# Patient Record
Sex: Female | Born: 1965
Health system: Southern US, Community
[De-identification: ages and names within clinical notes are randomized; demographics above are authoritative.]

## PROBLEM LIST (undated history)

## (undated) DIAGNOSIS — G4730 Sleep apnea, unspecified: Secondary | ICD-10-CM

## (undated) DIAGNOSIS — Z7289 Other problems related to lifestyle: Secondary | ICD-10-CM

## (undated) DIAGNOSIS — Z8719 Personal history of other diseases of the digestive system: Secondary | ICD-10-CM

## (undated) DIAGNOSIS — Z720 Tobacco use: Secondary | ICD-10-CM

## (undated) DIAGNOSIS — F410 Panic disorder [episodic paroxysmal anxiety] without agoraphobia: Secondary | ICD-10-CM

## (undated) DIAGNOSIS — I5042 Chronic combined systolic (congestive) and diastolic (congestive) heart failure: Secondary | ICD-10-CM

## (undated) DIAGNOSIS — F419 Anxiety disorder, unspecified: Secondary | ICD-10-CM

## (undated) DIAGNOSIS — I43 Cardiomyopathy in diseases classified elsewhere: Secondary | ICD-10-CM

## (undated) DIAGNOSIS — F1410 Cocaine abuse, uncomplicated: Secondary | ICD-10-CM

## (undated) DIAGNOSIS — E785 Hyperlipidemia, unspecified: Secondary | ICD-10-CM

## (undated) DIAGNOSIS — I639 Cerebral infarction, unspecified: Secondary | ICD-10-CM

## (undated) DIAGNOSIS — R32 Unspecified urinary incontinence: Secondary | ICD-10-CM

## (undated) DIAGNOSIS — Z91199 Patient's noncompliance with other medical treatment and regimen due to unspecified reason: Secondary | ICD-10-CM

## (undated) DIAGNOSIS — S8263XA Displaced fracture of lateral malleolus of unspecified fibula, initial encounter for closed fracture: Secondary | ICD-10-CM

## (undated) DIAGNOSIS — D649 Anemia, unspecified: Secondary | ICD-10-CM

## (undated) DIAGNOSIS — I480 Paroxysmal atrial fibrillation: Secondary | ICD-10-CM

## (undated) DIAGNOSIS — J449 Chronic obstructive pulmonary disease, unspecified: Secondary | ICD-10-CM

## (undated) DIAGNOSIS — I119 Hypertensive heart disease without heart failure: Secondary | ICD-10-CM

## (undated) DIAGNOSIS — N183 Chronic kidney disease, stage 3 unspecified: Secondary | ICD-10-CM

## (undated) DIAGNOSIS — Z9114 Patient's other noncompliance with medication regimen: Secondary | ICD-10-CM

## (undated) DIAGNOSIS — F1090 Alcohol use, unspecified, uncomplicated: Secondary | ICD-10-CM

## (undated) DIAGNOSIS — I4719 Other supraventricular tachycardia: Secondary | ICD-10-CM

## (undated) DIAGNOSIS — Z789 Other specified health status: Secondary | ICD-10-CM

## (undated) DIAGNOSIS — Z9119 Patient's noncompliance with other medical treatment and regimen: Secondary | ICD-10-CM

## (undated) DIAGNOSIS — K922 Gastrointestinal hemorrhage, unspecified: Secondary | ICD-10-CM

## (undated) DIAGNOSIS — Z8679 Personal history of other diseases of the circulatory system: Secondary | ICD-10-CM

## (undated) DIAGNOSIS — I10 Essential (primary) hypertension: Secondary | ICD-10-CM

## (undated) DIAGNOSIS — I70263 Atherosclerosis of native arteries of extremities with gangrene, bilateral legs: Secondary | ICD-10-CM

## (undated) DIAGNOSIS — I509 Heart failure, unspecified: Secondary | ICD-10-CM

## (undated) DIAGNOSIS — N6320 Unspecified lump in the left breast, unspecified quadrant: Secondary | ICD-10-CM

## (undated) DIAGNOSIS — E669 Obesity, unspecified: Secondary | ICD-10-CM

## (undated) DIAGNOSIS — E119 Type 2 diabetes mellitus without complications: Secondary | ICD-10-CM

## (undated) DIAGNOSIS — I471 Supraventricular tachycardia: Secondary | ICD-10-CM

## (undated) DIAGNOSIS — E11610 Type 2 diabetes mellitus with diabetic neuropathic arthropathy: Secondary | ICD-10-CM

## (undated) HISTORY — DX: Chronic combined systolic (congestive) and diastolic (congestive) heart failure: I50.42

## (undated) HISTORY — DX: Chronic kidney disease, stage 3 (moderate): N18.3

## (undated) HISTORY — DX: Alcohol use, unspecified, uncomplicated: F10.90

## (undated) HISTORY — DX: Morbid (severe) obesity due to excess calories: E66.01

## (undated) HISTORY — DX: Supraventricular tachycardia: I47.1

## (undated) HISTORY — DX: Cocaine abuse, uncomplicated: F14.10

## (undated) HISTORY — DX: Atherosclerosis of native arteries of extremities with gangrene, bilateral legs: I70.263

## (undated) HISTORY — DX: Chronic kidney disease, stage 3 unspecified: N18.30

## (undated) HISTORY — DX: Other supraventricular tachycardia: I47.19

## (undated) HISTORY — DX: Other specified health status: Z78.9

## (undated) HISTORY — DX: Paroxysmal atrial fibrillation: I48.0

## (undated) HISTORY — DX: Unspecified urinary incontinence: R32

## (undated) HISTORY — DX: Type 2 diabetes mellitus without complications: E11.9

## (undated) HISTORY — DX: Gastrointestinal hemorrhage, unspecified: K92.2

## (undated) HISTORY — DX: Hyperlipidemia, unspecified: E78.5

## (undated) HISTORY — PX: DILATION AND CURETTAGE OF UTERUS: SHX78

## (undated) HISTORY — DX: Obesity, unspecified: E66.9

## (undated) HISTORY — DX: Other problems related to lifestyle: Z72.89

## (undated) HISTORY — DX: Heart failure, unspecified: I50.9

## (undated) HISTORY — PX: BREAST BIOPSY: SHX20

## (undated) HISTORY — DX: Anemia, unspecified: D64.9

## (undated) SURGERY — Surgical Case
Anesthesia: *Unknown

---

## 2001-03-31 ENCOUNTER — Inpatient Hospital Stay (HOSPITAL_COMMUNITY): Admission: EM | Admit: 2001-03-31 | Discharge: 2001-04-04 | Payer: Self-pay | Admitting: Cardiology

## 2001-10-30 ENCOUNTER — Inpatient Hospital Stay (HOSPITAL_COMMUNITY): Admission: EM | Admit: 2001-10-30 | Discharge: 2001-11-02 | Payer: Self-pay | Admitting: Emergency Medicine

## 2001-10-30 ENCOUNTER — Encounter: Payer: Self-pay | Admitting: Emergency Medicine

## 2002-01-02 ENCOUNTER — Ambulatory Visit (HOSPITAL_COMMUNITY): Admission: RE | Admit: 2002-01-02 | Discharge: 2002-01-02 | Payer: Self-pay | Admitting: Cardiology

## 2004-01-07 ENCOUNTER — Emergency Department (HOSPITAL_COMMUNITY): Admission: EM | Admit: 2004-01-07 | Discharge: 2004-01-07 | Payer: Self-pay | Admitting: Emergency Medicine

## 2004-08-13 ENCOUNTER — Ambulatory Visit (HOSPITAL_COMMUNITY): Admission: RE | Admit: 2004-08-13 | Discharge: 2004-08-13 | Payer: Self-pay | Admitting: Family Medicine

## 2004-08-20 ENCOUNTER — Ambulatory Visit (HOSPITAL_COMMUNITY): Admission: RE | Admit: 2004-08-20 | Discharge: 2004-08-20 | Payer: Self-pay | Admitting: Family Medicine

## 2004-09-23 ENCOUNTER — Ambulatory Visit: Payer: Self-pay | Admitting: Family Medicine

## 2004-09-23 ENCOUNTER — Inpatient Hospital Stay (HOSPITAL_COMMUNITY): Admission: AD | Admit: 2004-09-23 | Discharge: 2004-09-24 | Payer: Self-pay | Admitting: Internal Medicine

## 2005-04-24 ENCOUNTER — Ambulatory Visit: Payer: Self-pay | Admitting: Family Medicine

## 2005-05-06 ENCOUNTER — Ambulatory Visit: Payer: Self-pay | Admitting: Family Medicine

## 2005-05-06 ENCOUNTER — Inpatient Hospital Stay (HOSPITAL_COMMUNITY): Admission: EM | Admit: 2005-05-06 | Discharge: 2005-05-11 | Payer: Self-pay | Admitting: Emergency Medicine

## 2005-05-18 ENCOUNTER — Ambulatory Visit: Payer: Self-pay | Admitting: Family Medicine

## 2005-05-18 ENCOUNTER — Inpatient Hospital Stay (HOSPITAL_COMMUNITY): Admission: EM | Admit: 2005-05-18 | Discharge: 2005-05-22 | Payer: Self-pay | Admitting: Emergency Medicine

## 2005-07-30 IMAGING — CR DG CHEST 2V
2 series · 2 of 2 positions shown · non-contrast
Comparison: none

CLINICAL DATA: Cough.
 PA AND LATERAL CHEST 
 Comparison 10/30/01.  There is chronic cardiomegaly with tortuosity of the thoracic aorta.  There is some mild peribronchial thickening bilaterally which can be seen with bronchitis.  No acute infiltrates or effusions or pulmonary edema.
 IMPRESSION
 Cardiomegaly.  Mild bronchitic changes.

[view not recorded (1 of 2)]
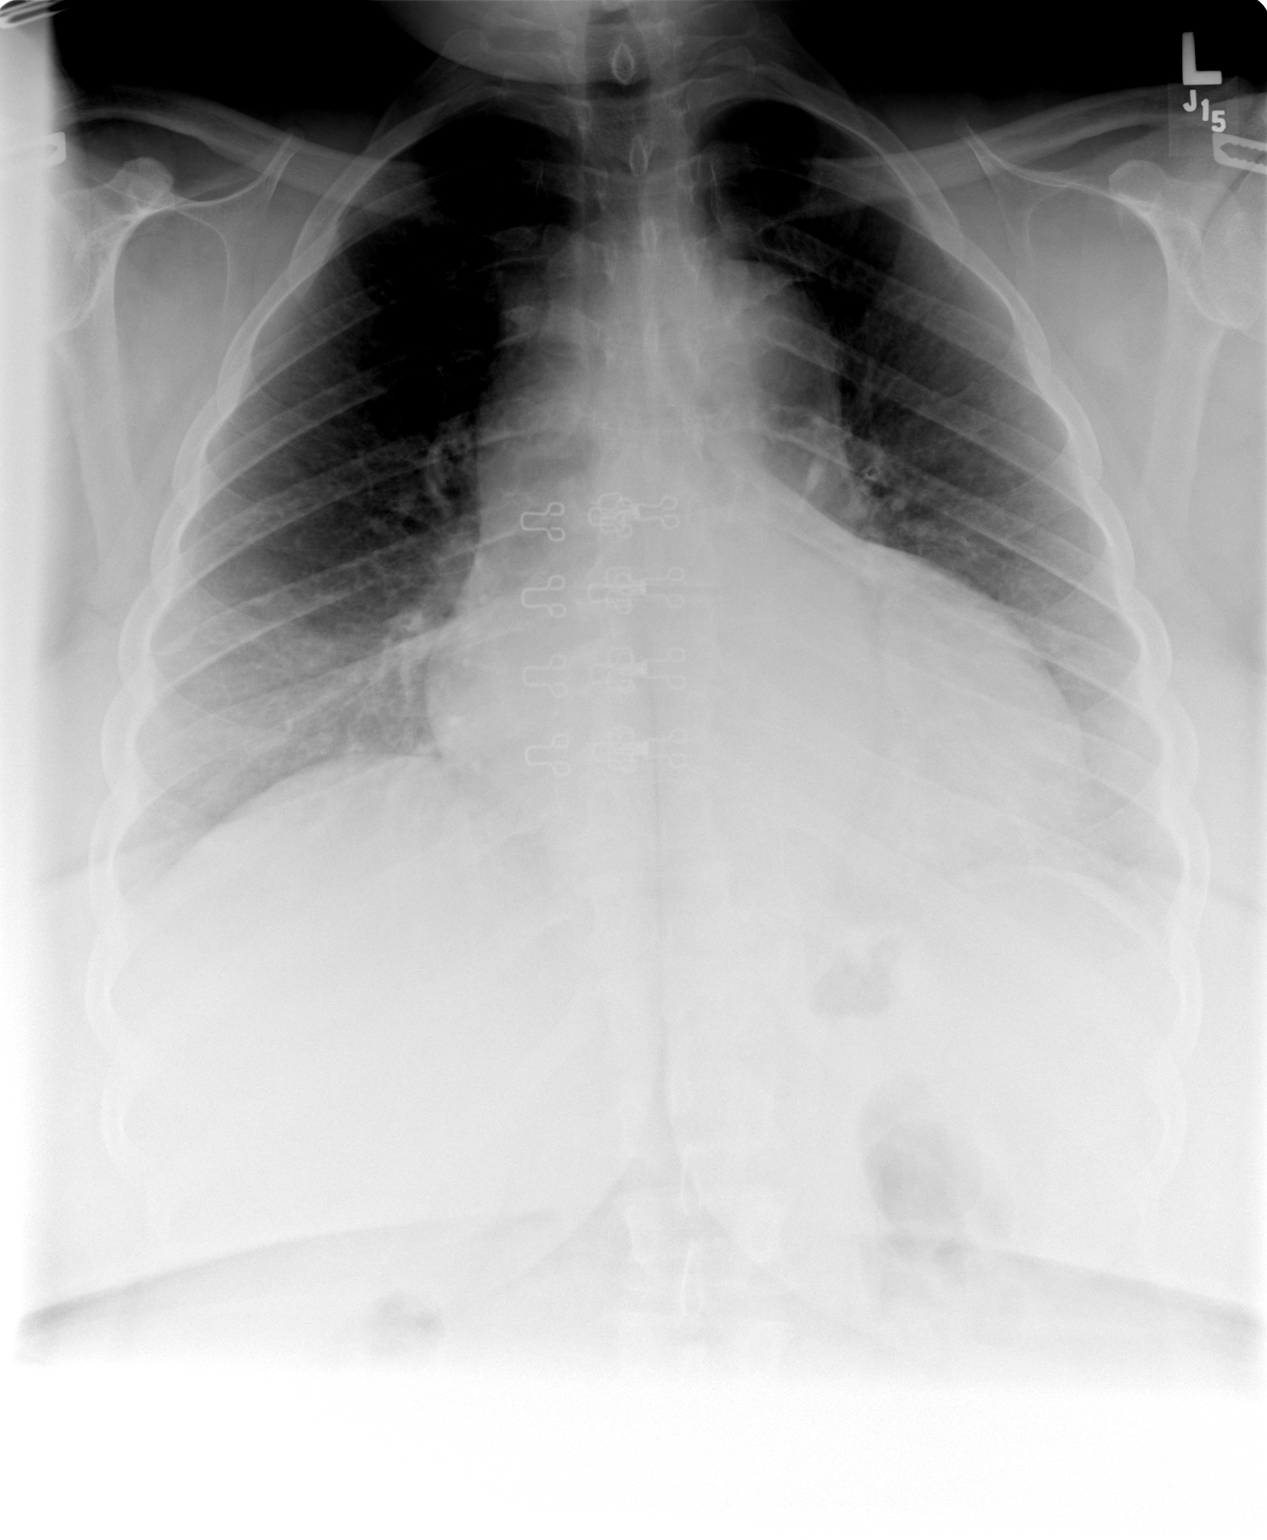

[view not recorded (2 of 2)]
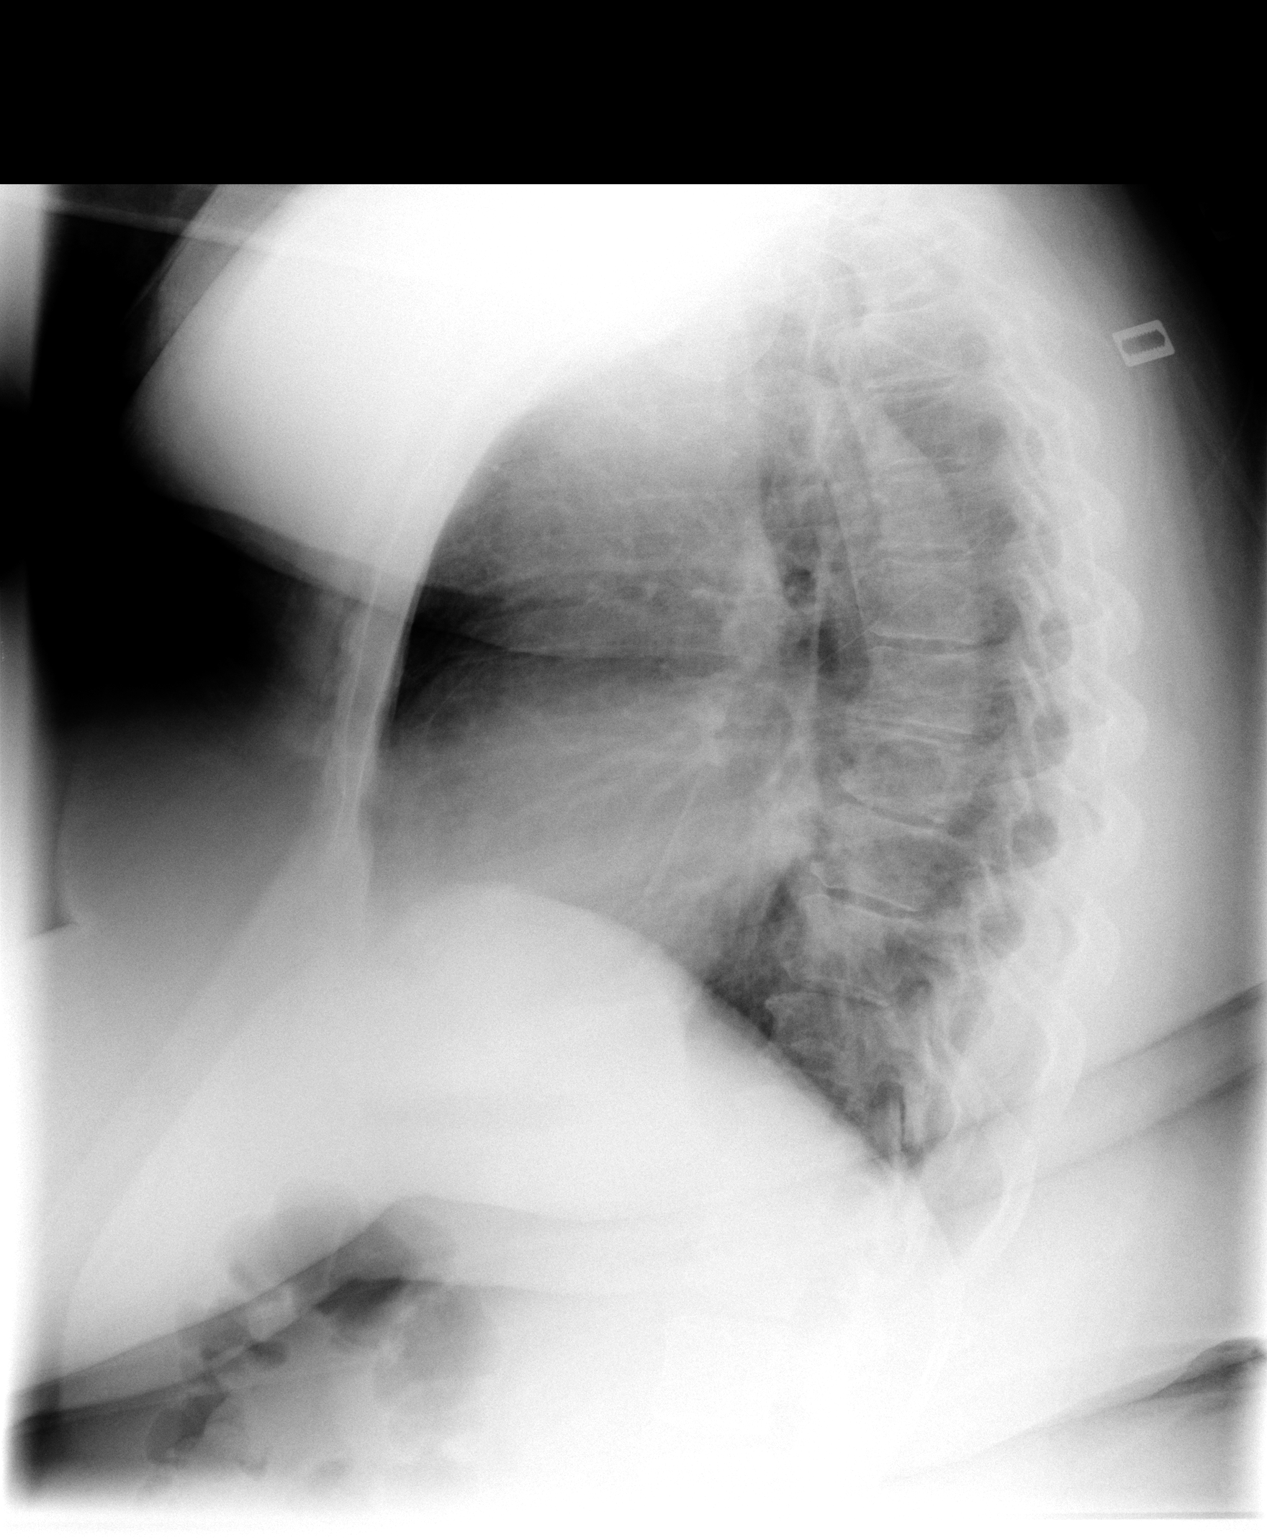

[2 of 2 positions shown; findings below may reference images not displayed]

## 2005-08-11 ENCOUNTER — Inpatient Hospital Stay (HOSPITAL_COMMUNITY): Admission: EM | Admit: 2005-08-11 | Discharge: 2005-08-14 | Payer: Self-pay | Admitting: Emergency Medicine

## 2005-08-11 ENCOUNTER — Ambulatory Visit: Payer: Self-pay | Admitting: *Deleted

## 2005-09-08 ENCOUNTER — Ambulatory Visit: Payer: Self-pay | Admitting: Family Medicine

## 2005-09-23 ENCOUNTER — Ambulatory Visit: Payer: Self-pay | Admitting: Family Medicine

## 2005-09-24 ENCOUNTER — Ambulatory Visit (HOSPITAL_COMMUNITY): Admission: RE | Admit: 2005-09-24 | Discharge: 2005-09-24 | Payer: Self-pay | Admitting: Family Medicine

## 2005-10-14 ENCOUNTER — Ambulatory Visit: Payer: Self-pay | Admitting: Family Medicine

## 2005-10-28 ENCOUNTER — Ambulatory Visit: Payer: Self-pay | Admitting: Family Medicine

## 2006-01-14 ENCOUNTER — Ambulatory Visit: Payer: Self-pay | Admitting: Family Medicine

## 2006-02-08 ENCOUNTER — Ambulatory Visit: Payer: Self-pay | Admitting: Family Medicine

## 2006-02-11 ENCOUNTER — Ambulatory Visit: Payer: Self-pay | Admitting: Family Medicine

## 2006-02-22 ENCOUNTER — Ambulatory Visit: Payer: Self-pay | Admitting: Family Medicine

## 2006-03-06 IMAGING — CR DG CHEST 2V
2 series · 2 of 2 positions shown · non-contrast
Comparison: 01/07/04.

CLINICAL DATA: Bronchitis and hypertension.  Smoker. 
 PA AND LATERAL CHEST

[view not recorded (1 of 2)]
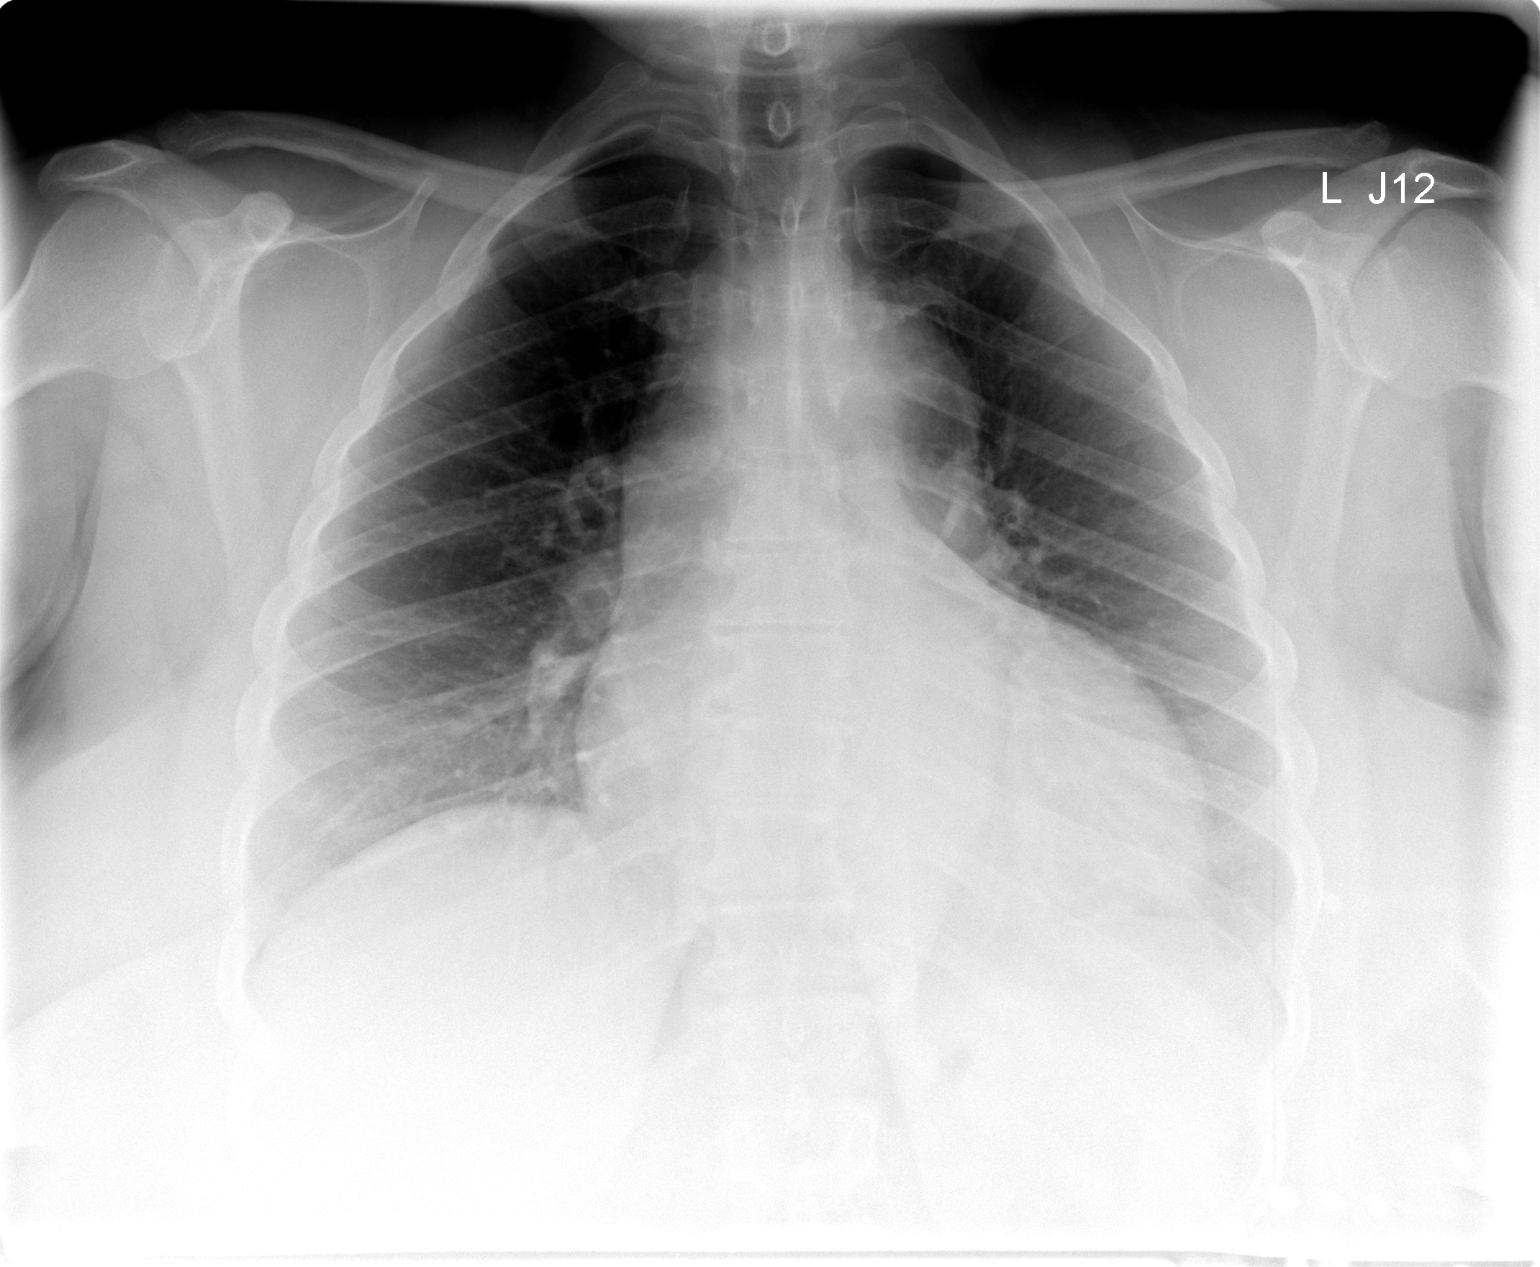

[view not recorded (2 of 2)]
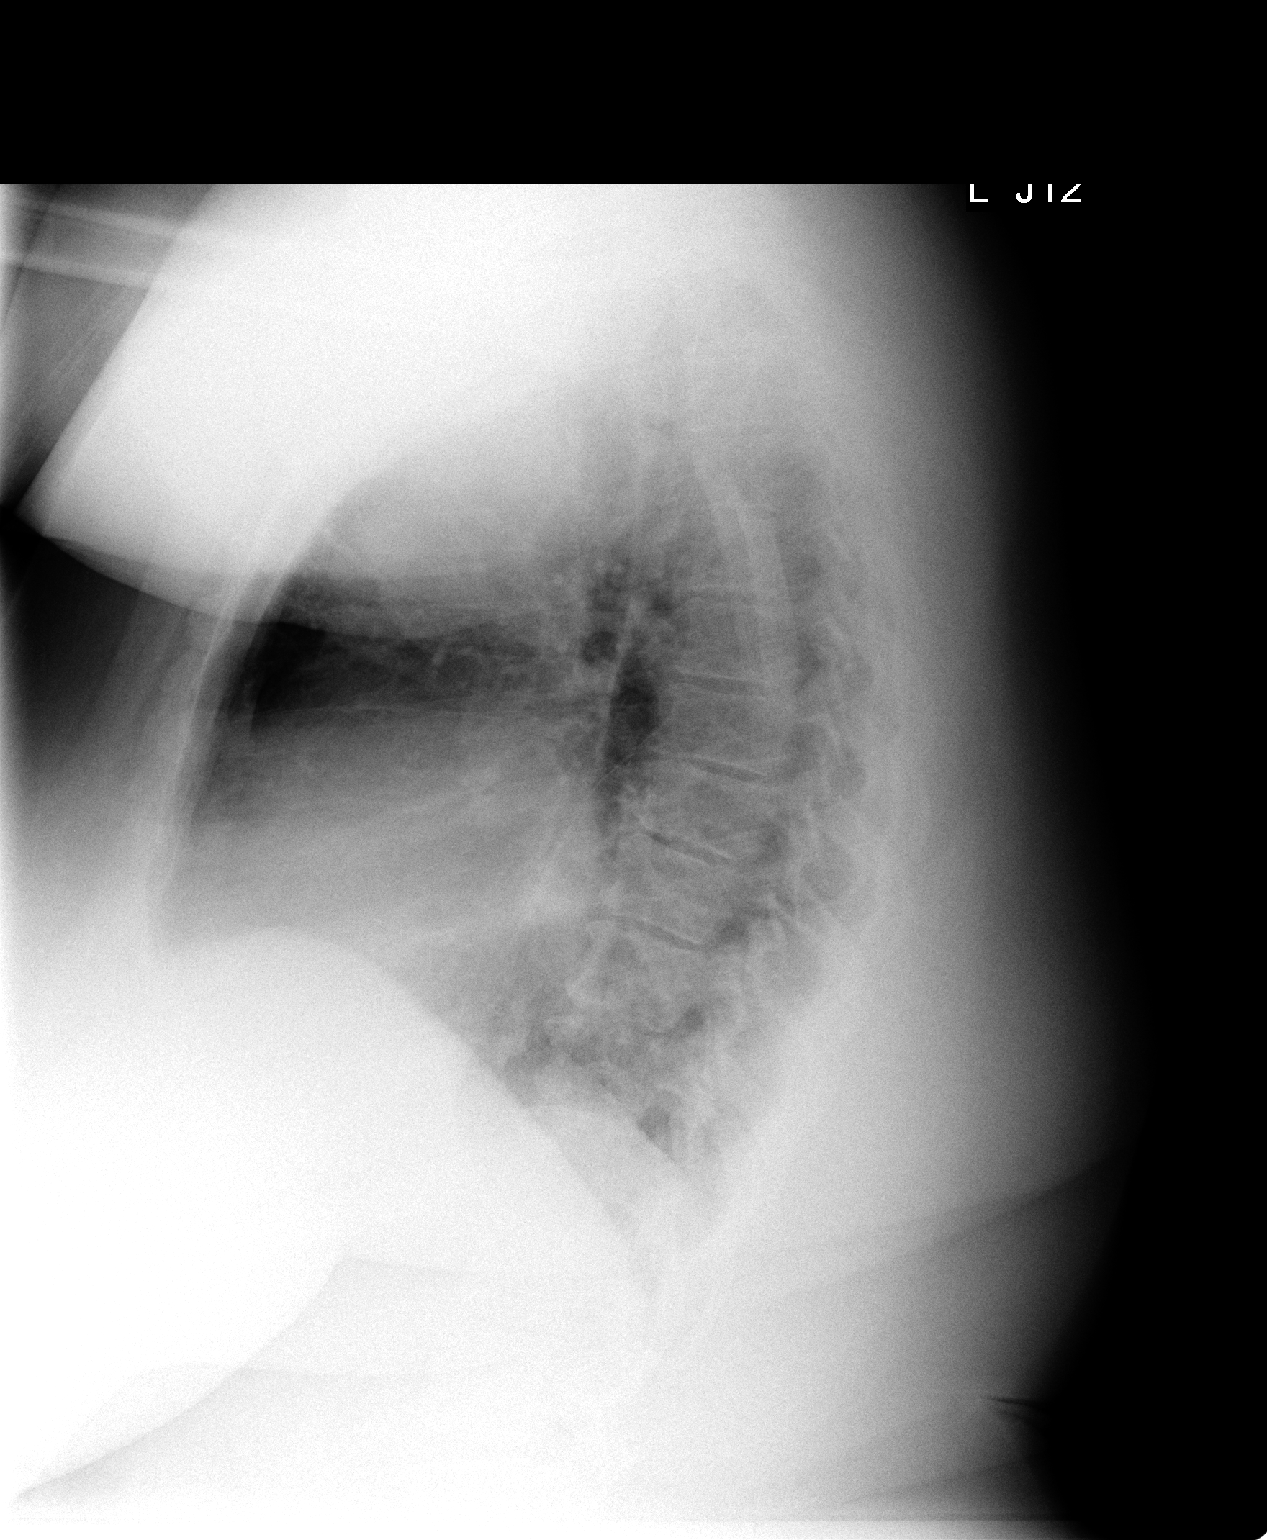

[2 of 2 positions shown; findings below may reference images not displayed]

There is stable cardiomegaly and aortic tortuosity.  Overall pulmonary aeration has improved with less peribronchial thickening.  There is no confluent airspace opacity or pleural effusion.  Osseous structures appear stable with scattered thoracic spine osteophytes.  
 IMPRESSION
 Stable cardiomegaly and aortic tortuosity.  Improved chronic peribronchial thickening.

## 2006-04-16 IMAGING — CR DG CHEST 2V
2 series · 2 of 2 positions shown · non-contrast
Comparison: 2 view chest x-ray 08/13/2004.

CLINICAL DATA: Uncontrolled hypertension.

CHEST - 2 VIEW  09/23/2004:

[view not recorded (1 of 2)]
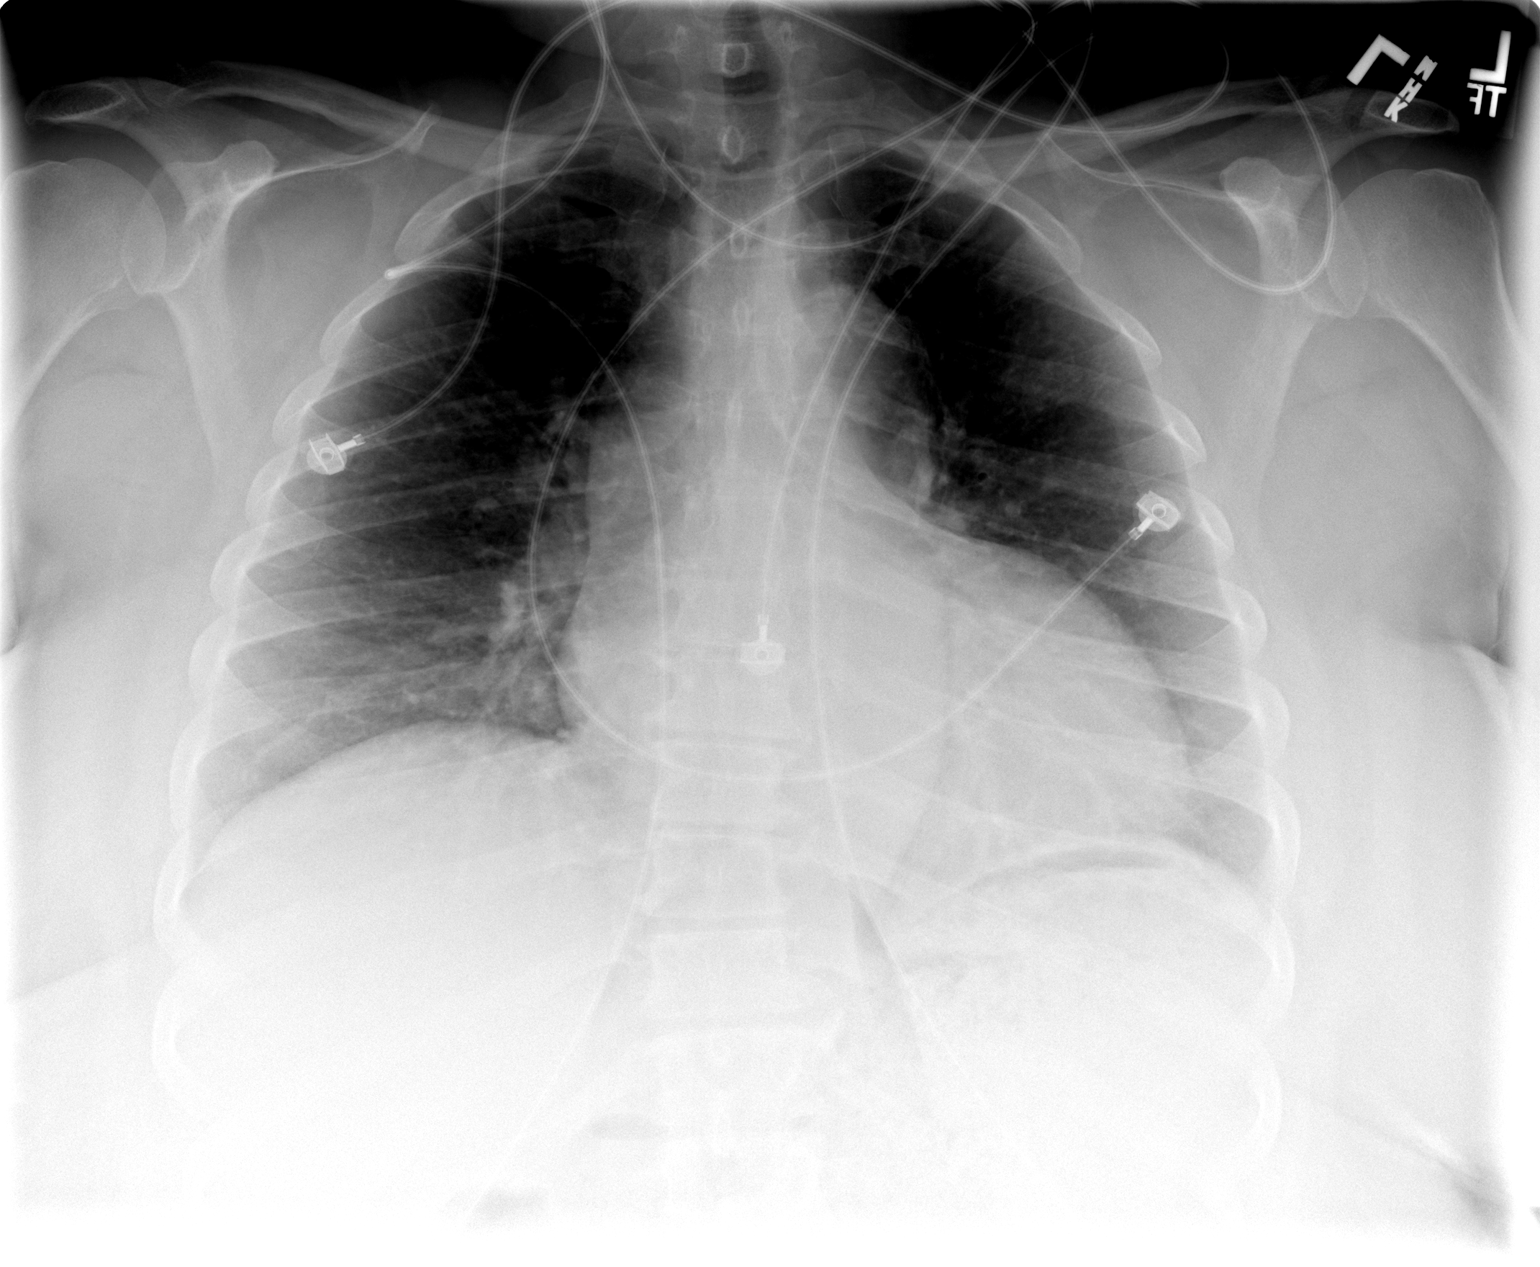

[view not recorded (2 of 2)]
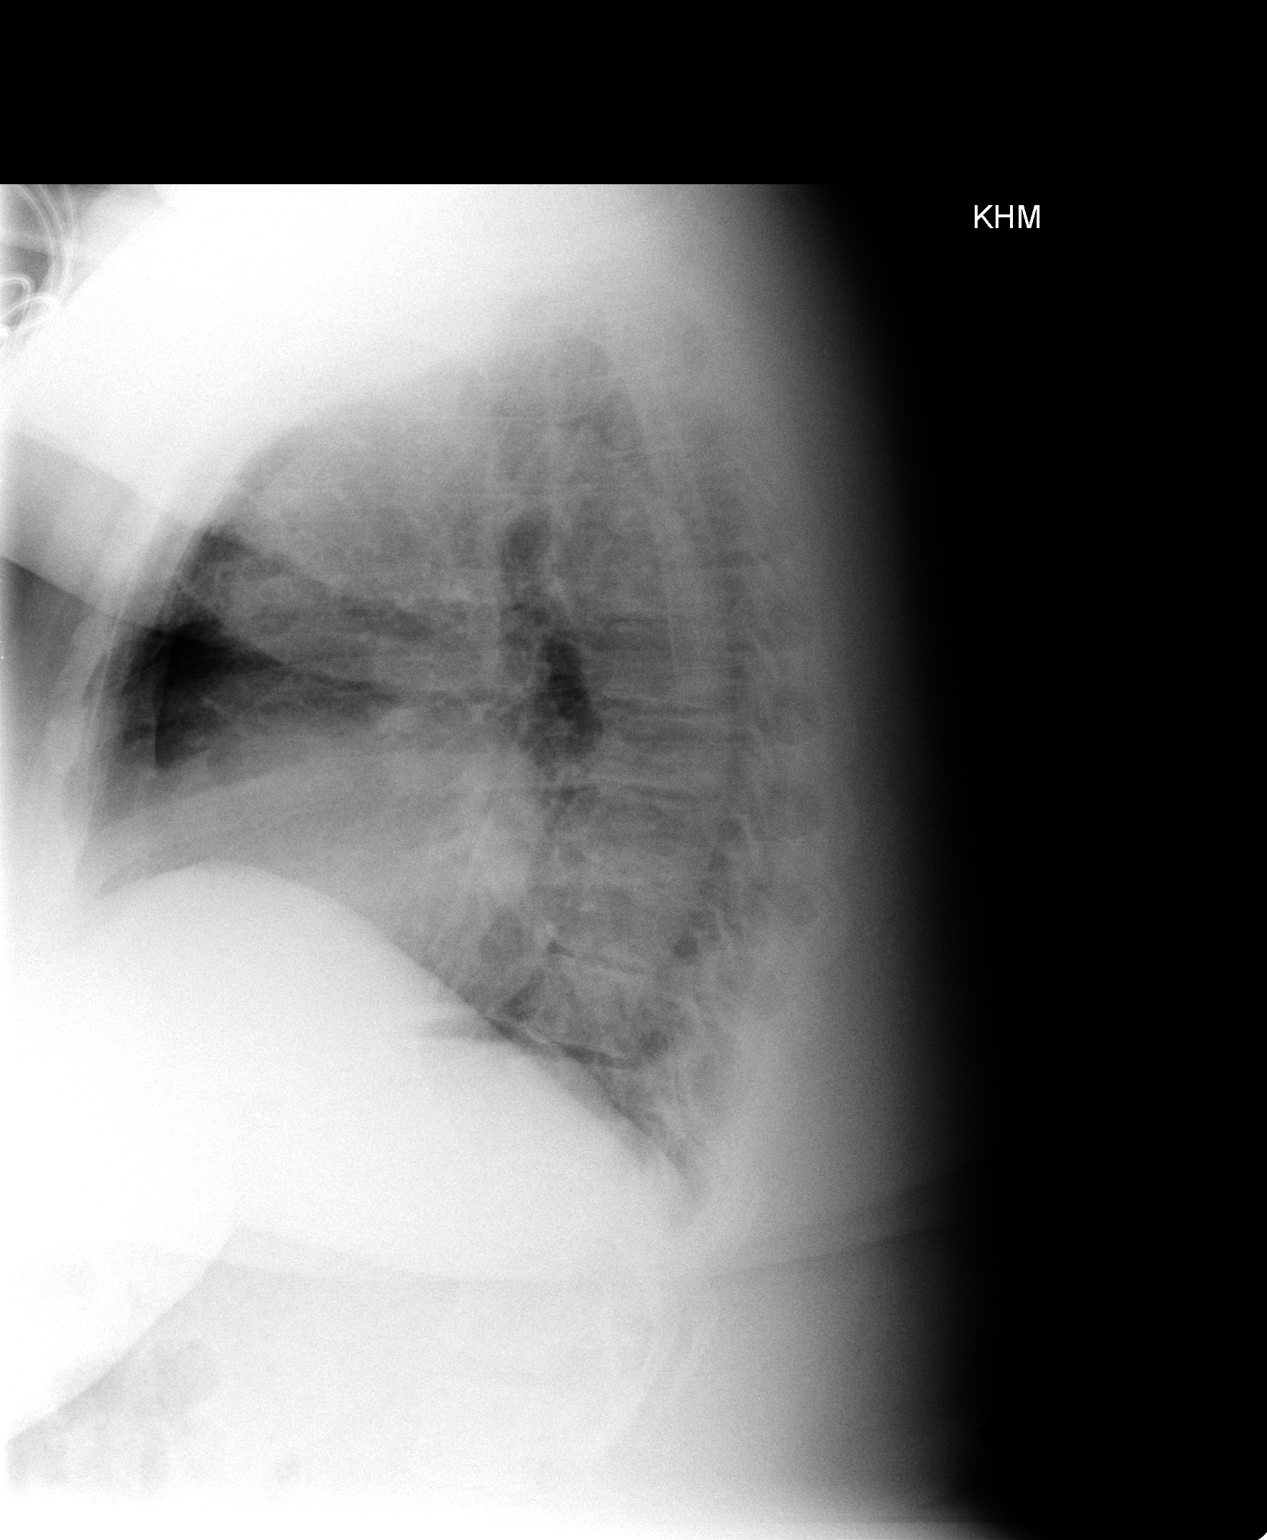

[2 of 2 positions shown; findings below may reference images not displayed]

FINDINGS: The heart is enlarged though stable. Thoracic aorta is tortuous
though unchanged. The hilar and mediastinal contours are otherwise unremarkable.
The lungs appear clear. Mild elevation of the right hemidiaphragm is again
noted. Degenerative changes are present throughout the thoracic spine.
IMPRESSION: 1. Stable cardiomegaly. Tortuous thoracic aorta consistent with hypertension,
unchanged.

2. No acute cardiopulmonary disease.

## 2006-04-17 IMAGING — MR MR HEAD W/O CM
6 of 9 series · 24 of 48 positions shown · non-contrast
Comparison: none

HISTORY: Hypertension question stroke

[Series 3: DWI · axial · 5.0mm · 0.76mm/px · z∈[-37,+95]mm · 3 of 25 slices shown (1 of 2)]
[im 1/25]
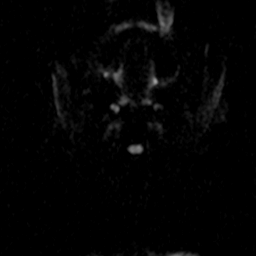
[im 13/25]
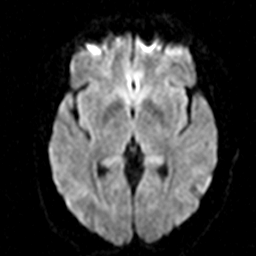
[im 25/25]
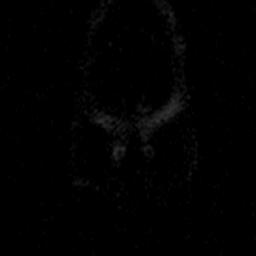

[Series 4: DWI · axial · 5.0mm · 0.86mm/px · z∈[-36,+96]mm · 3 of 25 slices shown (2 of 2)]
[im 1/25]
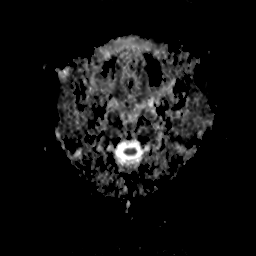
[im 13/25]
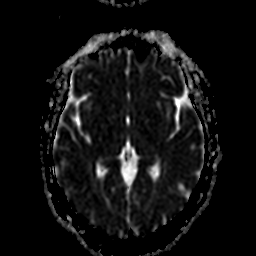
[im 25/25]
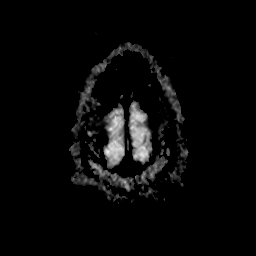

[Series 5: T2 · axial · 5.0mm · 0.57mm/px · z∈[-41,+96]mm · 4 of 24 slices shown]
[im 1/24]
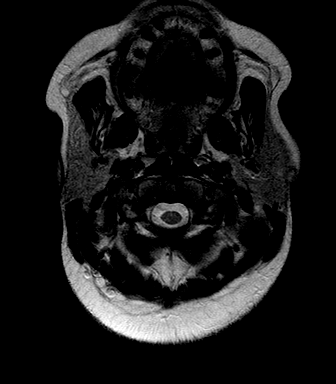
[im 8/24]
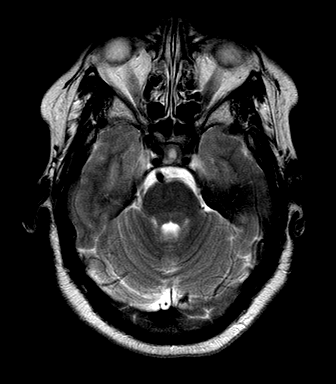
[im 16/24]
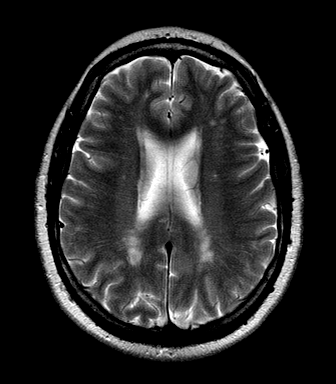
[im 24/24]
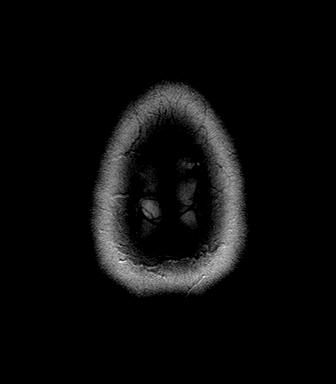

[Series 7: <mpr thick range> · axial · 1.6mm · 0.33mm/px · z∈[-45,+104]mm · 8 of 75 slices shown]
[im 1/75]
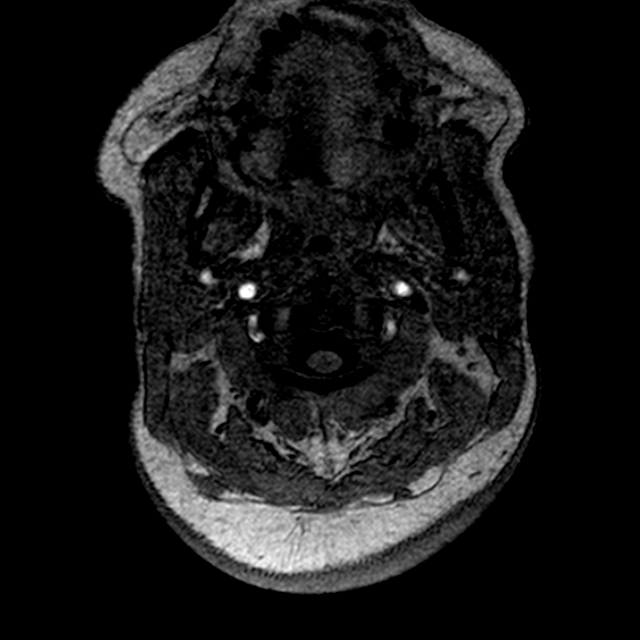
[im 15/75]
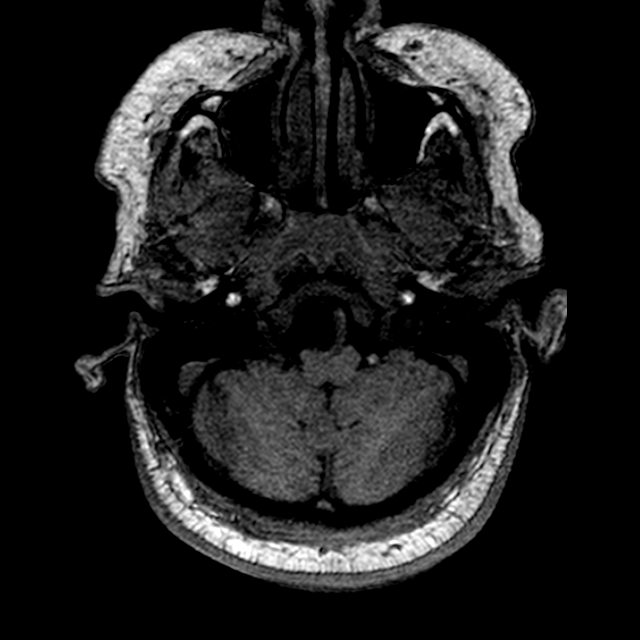
[im 23/75]
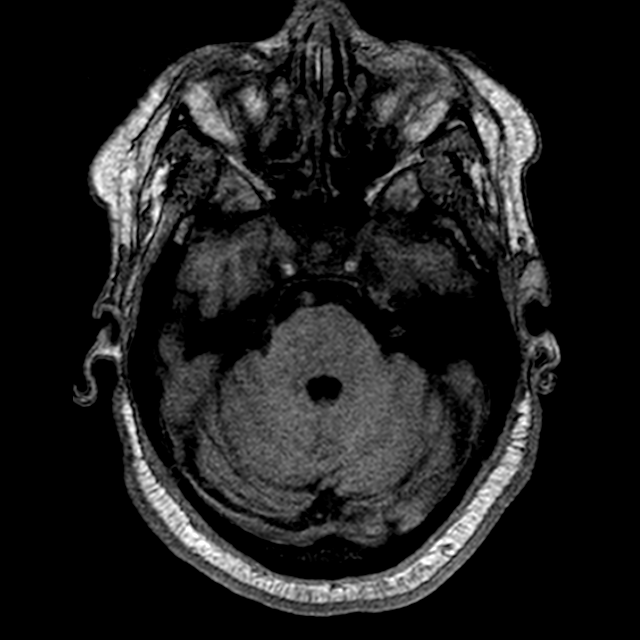
[im 30/75]
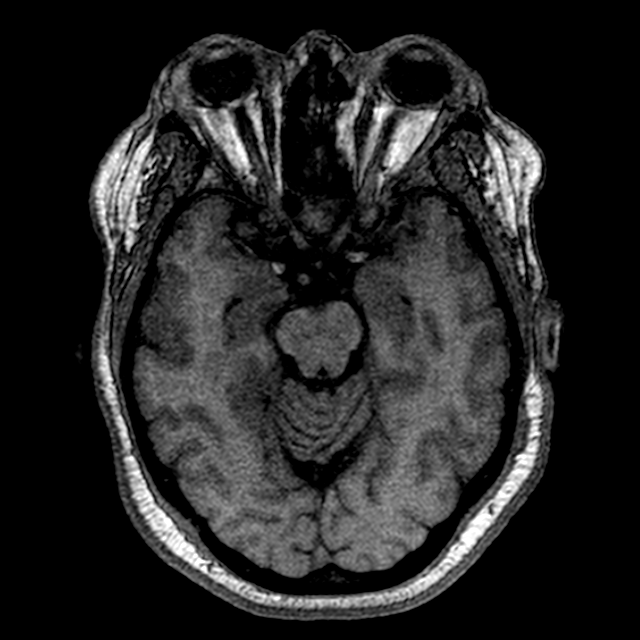
[im 45/75]
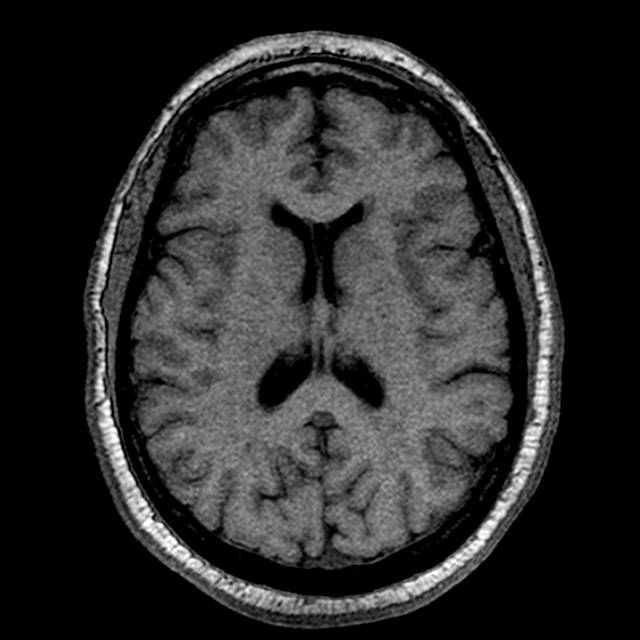
[im 52/75]
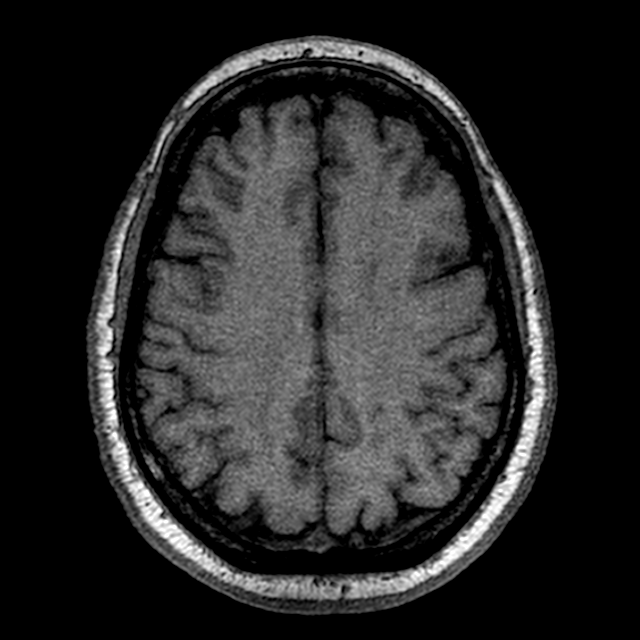
[im 60/75]
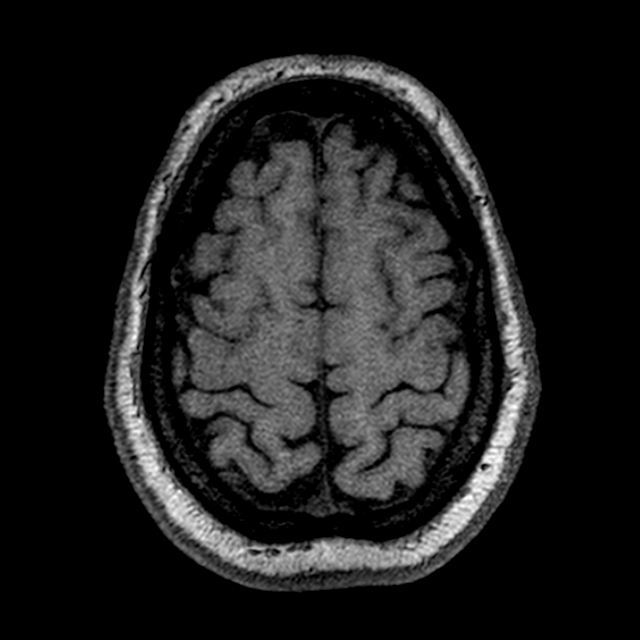
[im 75/75]
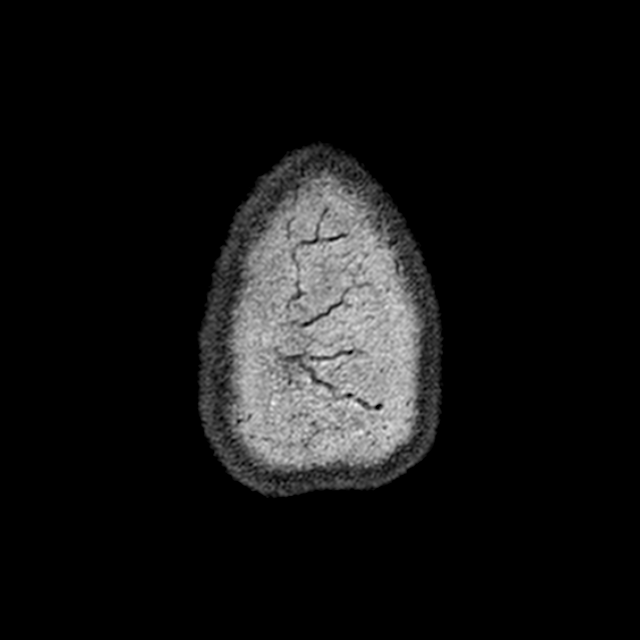

[Series 9: FLAIR · axial · 5.0mm · 0.56mm/px · z∈[-41,+96]mm · 4 of 24 slices shown]
[im 1/24]
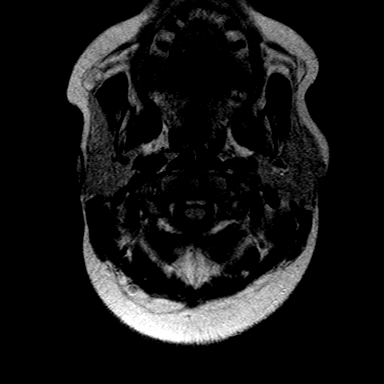
[im 8/24]
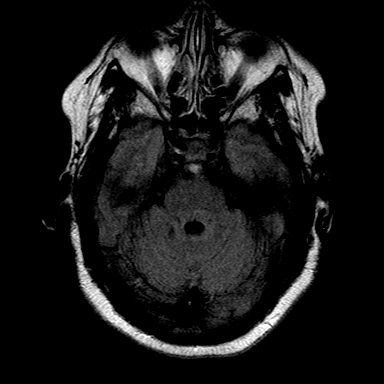
[im 16/24]
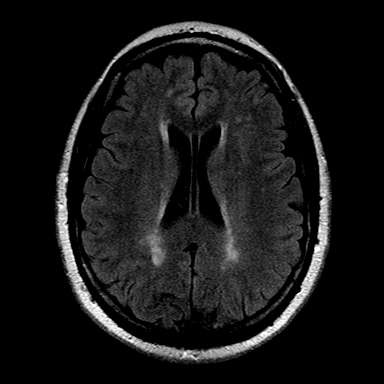
[im 24/24]
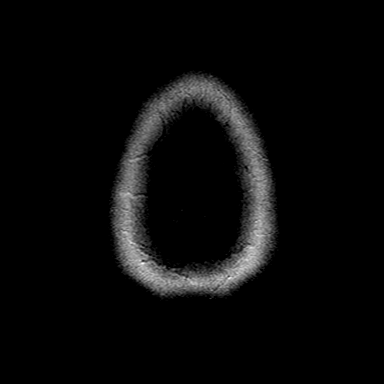

[Series 12: T1 · sagittal · 5.0mm · 0.66mm/px · 2 of 12 slices shown]
[im 1/12]
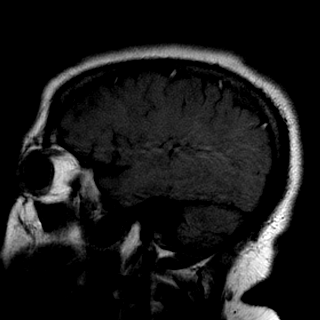
[im 12/12]
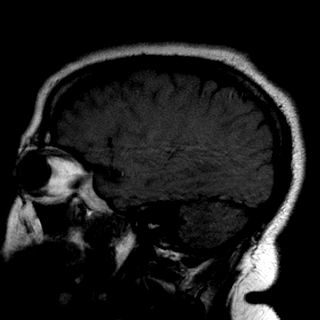

[24 of 48 positions shown; findings below may reference images not displayed]

MRI BRAIN WITHOUT CONTRAST:

Multiplanar noncontrast enlarging brain without priors for comparison.

Mild generalized atrophy.
Scattered foci of abnormal increased white matter signal intensity on FLAIR
sequence in both hemispheres question small vessel chronic ischemic changes,
demyelinating disorders multiple sclerosis, vasculitis, infectious inflammatory
processes, and in patient's with migraines.
No acute infarct by diffusion weighted images.
No mass,  hemorrhage, or old infarct.
No extra-axial fluid collections.
Flow-voids present in major intracranial vessels.
Dolichoectasia of the basilar artery seen with mild compression of the brainstem
at the pontomedullary junction.
Sella turcica, parasellar regions and basilar cisterns normal appearance.
Minimal mucosal thickening in right maxillary sinus.
IMPRESSION: Scattered foci of abnormal white matter signal in both hemispheres, differential
diagnosis above.
Mild dolichoectasia of the basilar artery with mild compression of the
brainstem.
No other intracranial abnormalities.

## 2006-04-19 ENCOUNTER — Ambulatory Visit: Payer: Self-pay | Admitting: Family Medicine

## 2006-05-18 ENCOUNTER — Ambulatory Visit: Payer: Self-pay | Admitting: Family Medicine

## 2006-07-14 ENCOUNTER — Ambulatory Visit: Payer: Self-pay | Admitting: Family Medicine

## 2006-07-28 ENCOUNTER — Ambulatory Visit: Payer: Self-pay | Admitting: Family Medicine

## 2006-08-03 ENCOUNTER — Ambulatory Visit: Payer: Self-pay | Admitting: Gastroenterology

## 2006-08-23 ENCOUNTER — Ambulatory Visit: Payer: Self-pay | Admitting: Gastroenterology

## 2006-08-23 ENCOUNTER — Ambulatory Visit (HOSPITAL_COMMUNITY): Admission: RE | Admit: 2006-08-23 | Discharge: 2006-08-23 | Payer: Self-pay | Admitting: Gastroenterology

## 2006-08-23 LAB — HM COLONOSCOPY: HM Colonoscopy: NORMAL

## 2006-09-27 ENCOUNTER — Ambulatory Visit (HOSPITAL_COMMUNITY): Admission: RE | Admit: 2006-09-27 | Discharge: 2006-09-27 | Payer: Self-pay | Admitting: Family Medicine

## 2006-10-13 ENCOUNTER — Ambulatory Visit: Payer: Self-pay | Admitting: Family Medicine

## 2006-11-17 ENCOUNTER — Encounter: Payer: Self-pay | Admitting: Family Medicine

## 2006-11-17 ENCOUNTER — Ambulatory Visit: Payer: Self-pay | Admitting: Family Medicine

## 2006-11-17 ENCOUNTER — Other Ambulatory Visit: Admission: RE | Admit: 2006-11-17 | Discharge: 2006-11-17 | Payer: Self-pay | Admitting: Family Medicine

## 2006-11-17 ENCOUNTER — Encounter (INDEPENDENT_AMBULATORY_CARE_PROVIDER_SITE_OTHER): Payer: Self-pay | Admitting: *Deleted

## 2006-11-17 LAB — CONVERTED CEMR LAB: Pap Smear: NORMAL

## 2006-11-18 ENCOUNTER — Encounter: Payer: Self-pay | Admitting: Family Medicine

## 2006-11-18 LAB — CONVERTED CEMR LAB
Candida species: NEGATIVE
Chlamydia, DNA Probe: NEGATIVE
GC Probe Amp, Genital: NEGATIVE
Gardnerella vaginalis: POSITIVE — AB
Trichomonal Vaginitis: POSITIVE — AB

## 2006-11-22 ENCOUNTER — Ambulatory Visit (HOSPITAL_COMMUNITY): Admission: RE | Admit: 2006-11-22 | Discharge: 2006-11-22 | Payer: Self-pay | Admitting: Family Medicine

## 2006-11-27 IMAGING — CR DG CHEST 1V PORT
1 series · 1 of 1 positions shown · non-contrast
Comparison: 09/23/04.

CLINICAL DATA: Hypertension. 
 PORTABLE CHEST ? 1 VIEW:

[view not recorded]
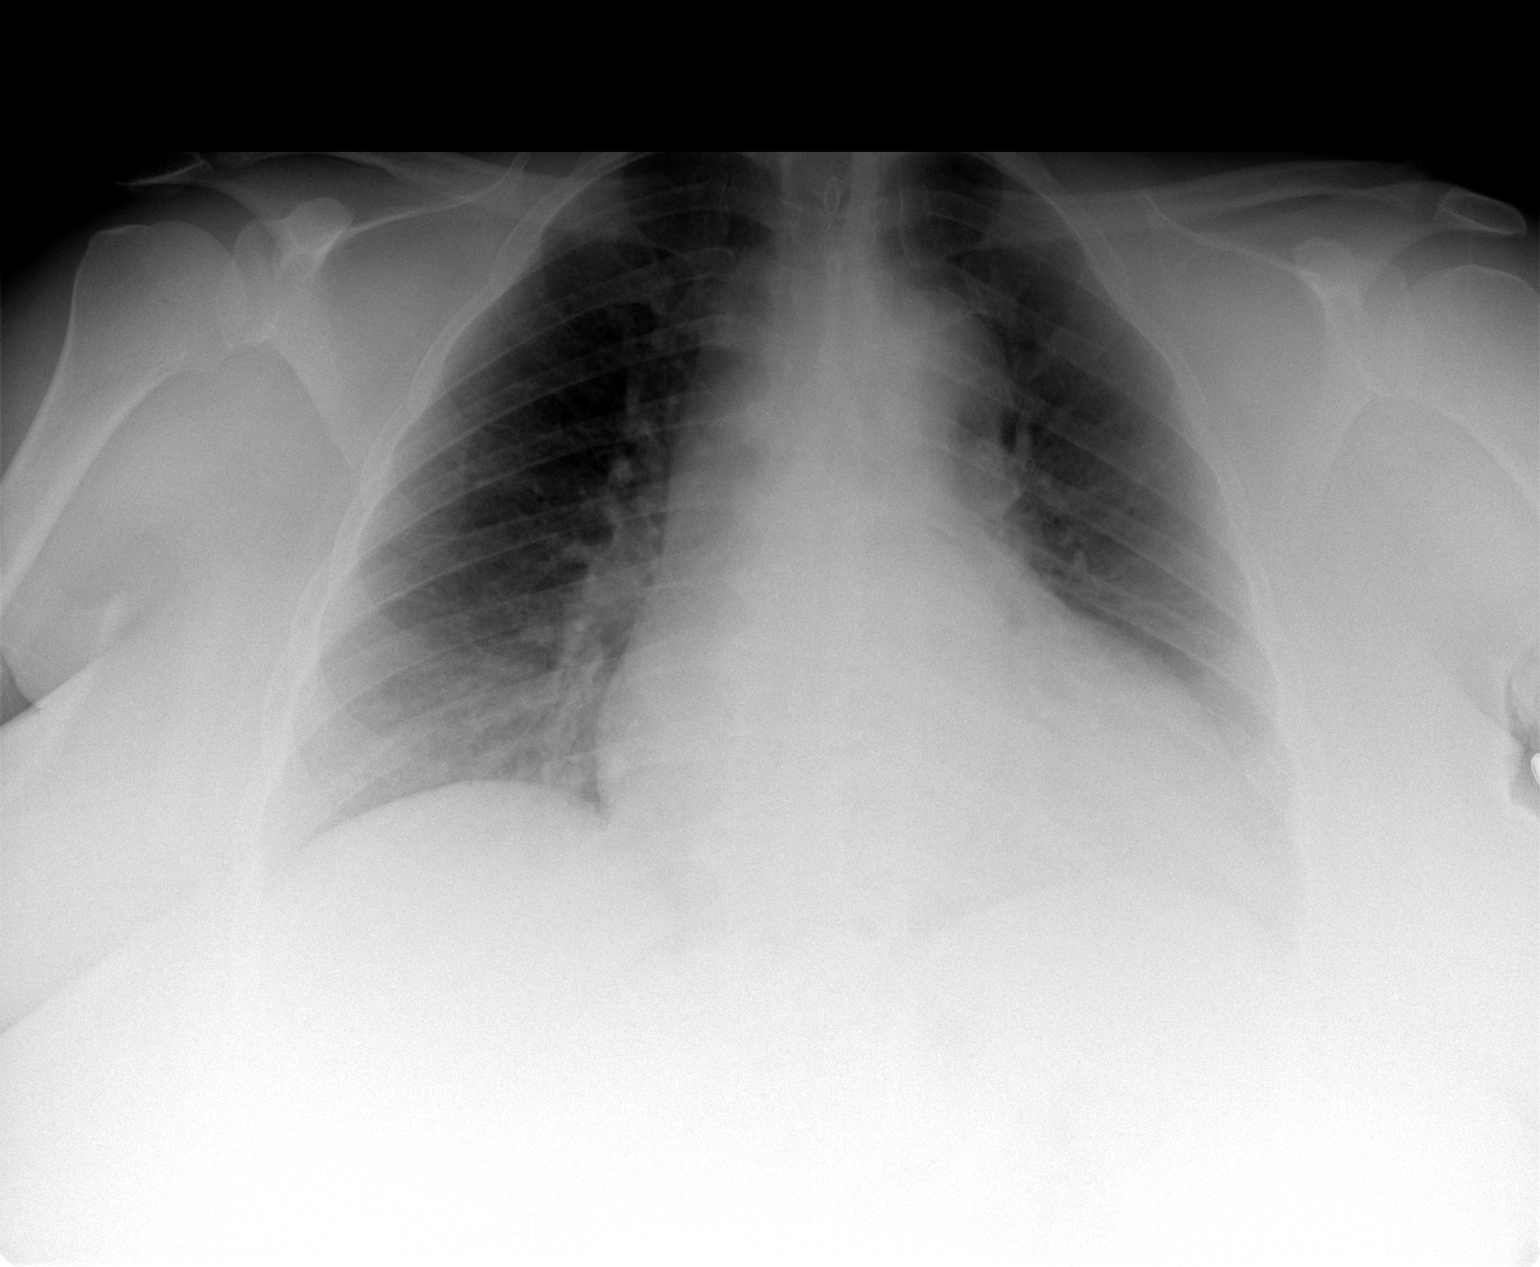

[1 of 1 positions shown; findings below may reference images not displayed]

Heart enlarged.  Aorta tortuous.  No frank congestive heart failure, active disease, or interval change.
IMPRESSION: Cardiomegaly ? no active disease.

## 2006-12-09 ENCOUNTER — Ambulatory Visit: Payer: Self-pay | Admitting: Orthopedic Surgery

## 2006-12-09 IMAGING — CR DG CHEST 1V PORT
1 series · 1 of 1 positions shown · non-contrast
Comparison: 05/06/05.

CLINICAL DATA: Hypertension.  
 PORTABLE CHEST - 1 VIEW - 05/18/05:

[view not recorded]
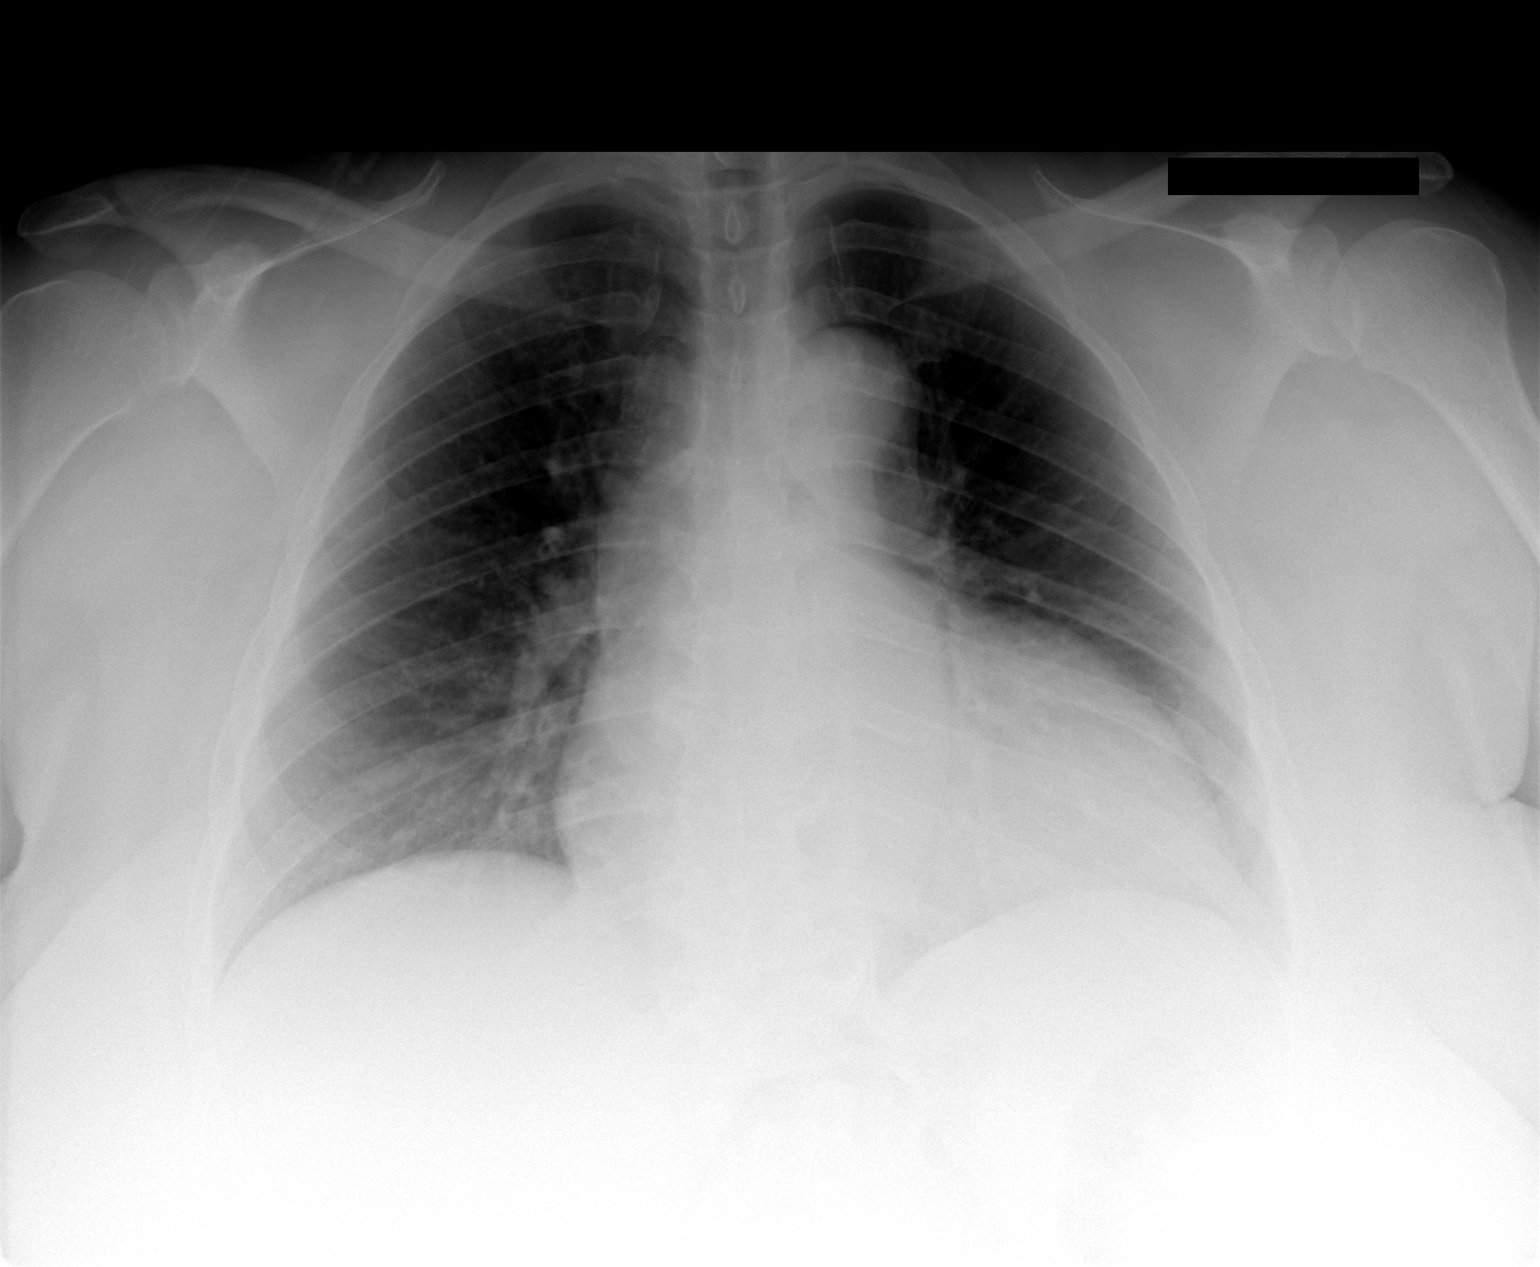

[1 of 1 positions shown; findings below may reference images not displayed]

Cardiomegaly is stable as well as tortuosity of the thoracic aorta.  Both lungs are clear.
IMPRESSION: Stable cardiomegaly.  No acute findings.

## 2006-12-12 IMAGING — MR MR MRA ABDOMEN W/ OR W/O CM
5 of 8 series · 20 of 48 positions shown · IV contrast (40ML GAD)
Comparison: none

CLINICAL DATA: Malignant renovascular hypertension

[Series 5: bSSFP · axial · 3.0mm · 0.70mm/px · z∈[+47,+224]mm · 4 of 60 slices shown (1 of 2)]
[im 1/60]
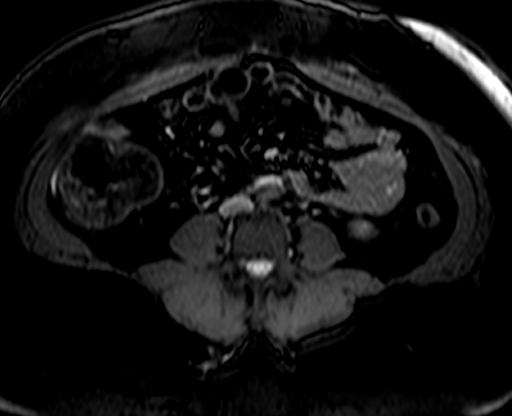
[im 20/60]
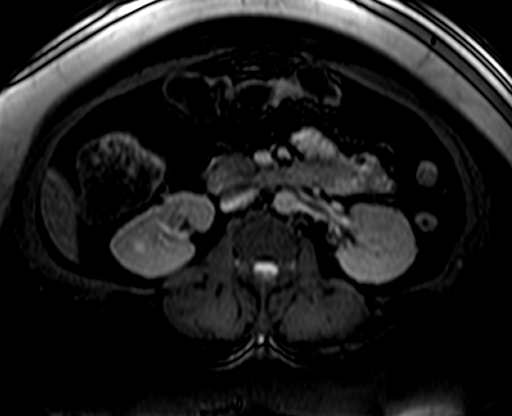
[im 40/60]
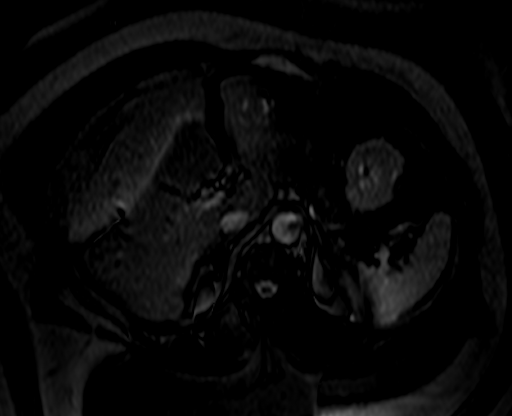
[im 60/60]
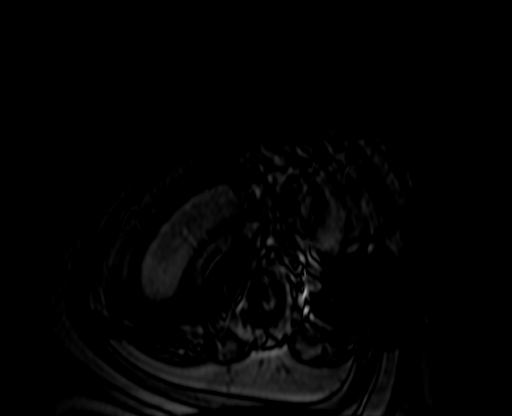

[Series 8: t2_haste_cor · coronal · 6.0mm · 0.90mm/px · 3 of 30 slices shown]
[im 1/30]
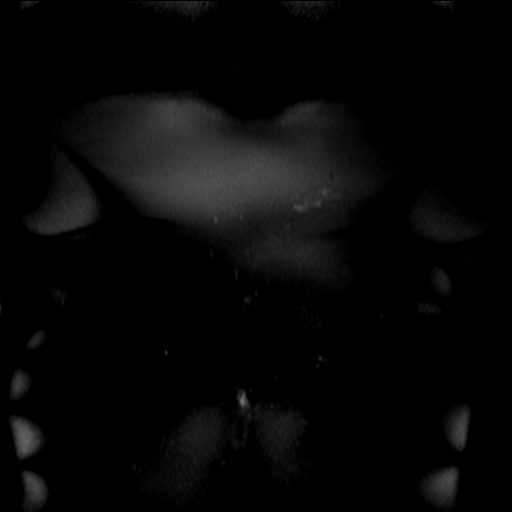
[im 15/30]
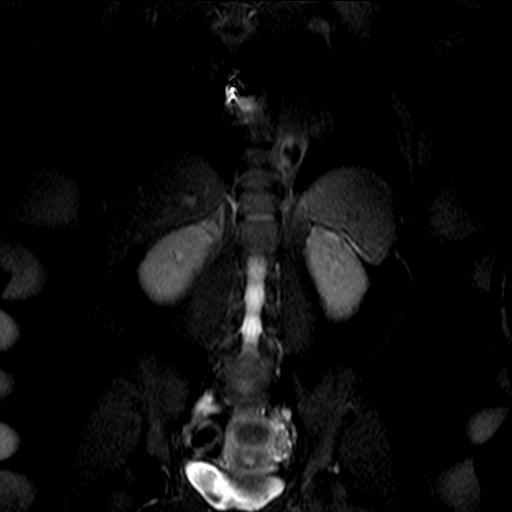
[im 30/30]
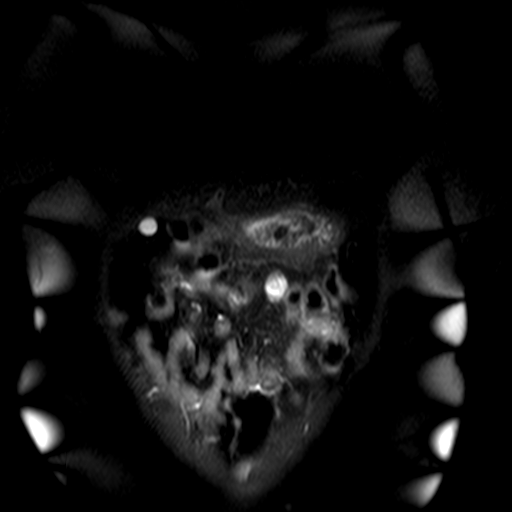

[Series 9: bSSFP · coronal · 3.0mm · 0.78mm/px · 4 of 45 slices shown (2 of 2)]
[im 1/45]
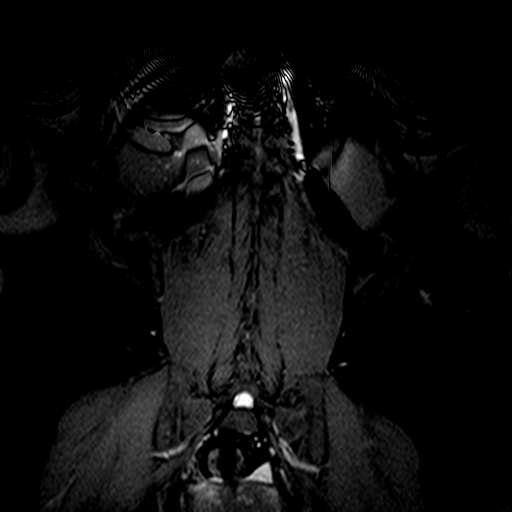
[im 15/45]
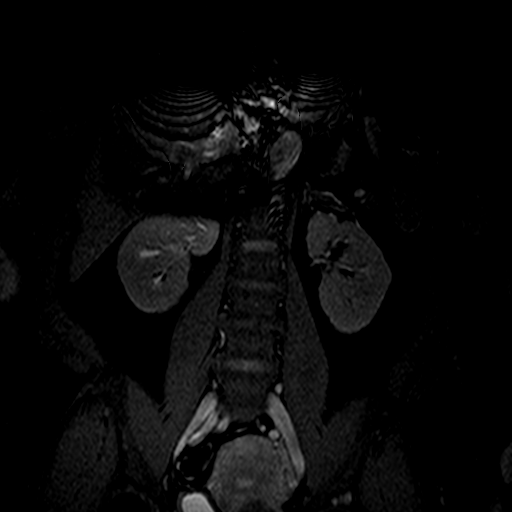
[im 30/45]
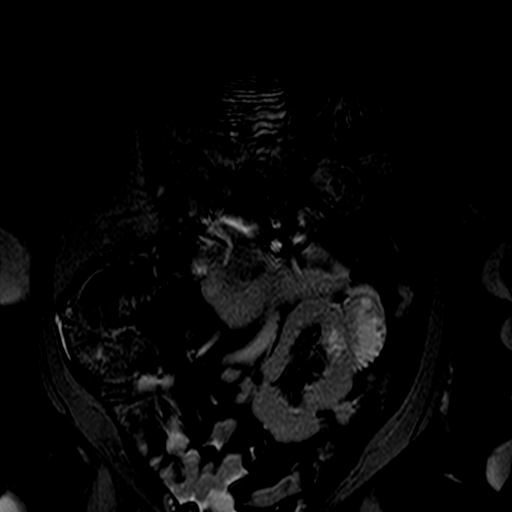
[im 45/45]
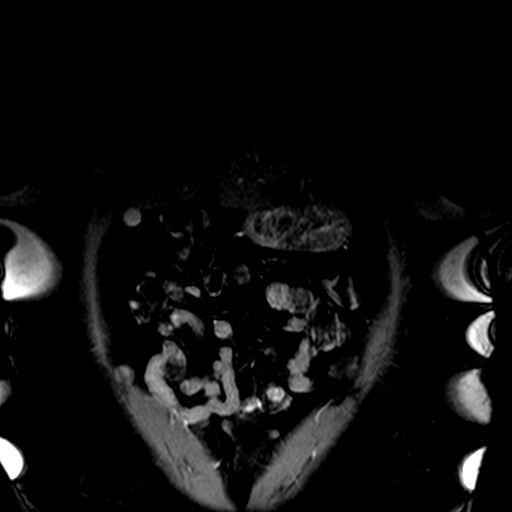

[Series 10: t1fs volume · axial · 2.0mm · 0.68mm/px · z∈[+34,+272]mm · 8 of 120 slices shown]
[im 1/120]
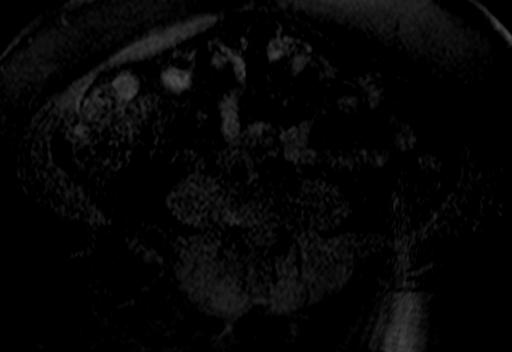
[im 14/120]
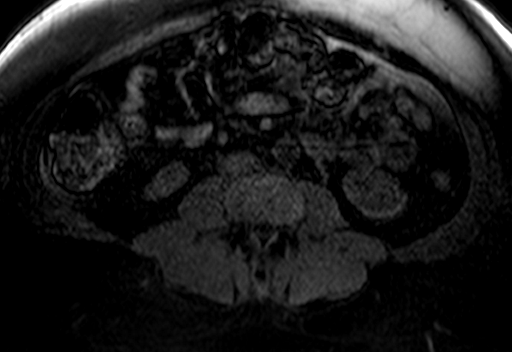
[im 40/120]
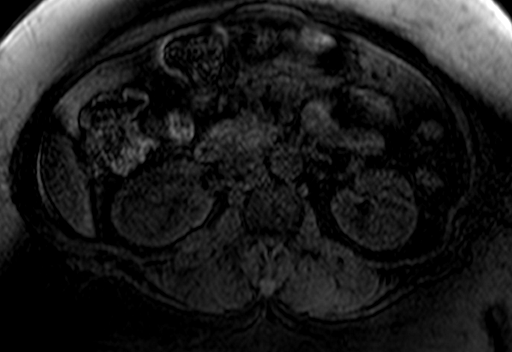
[im 53/120]
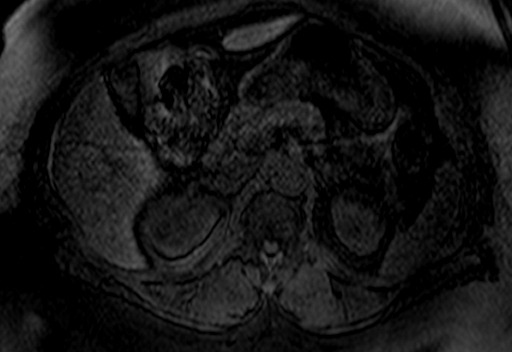
[im 67/120]
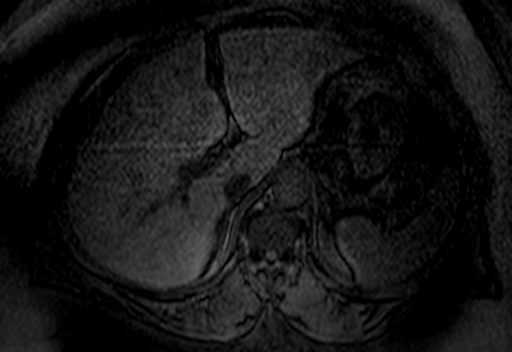
[im 80/120]
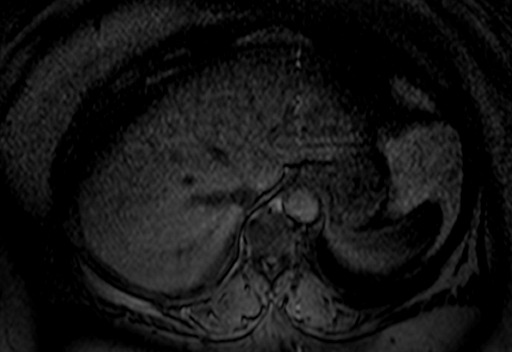
[im 106/120]
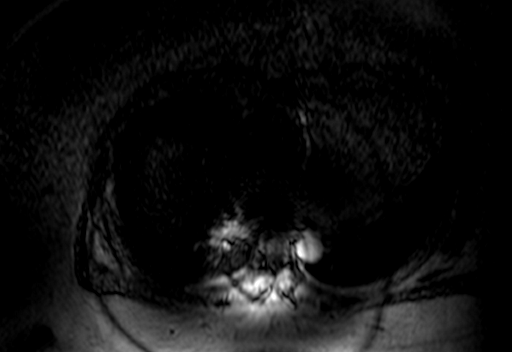
[im 120/120]
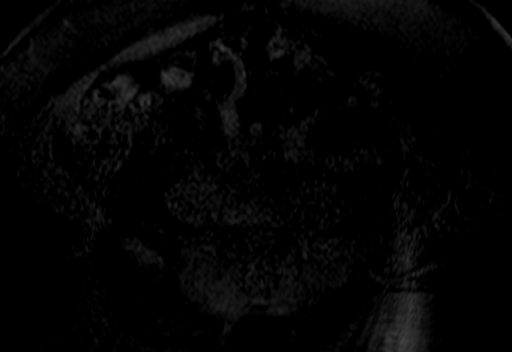

[Series 15: MRA post-contrast · coronal · 1.2mm · 0.45mm/px · 1 of 80 slices shown]
[im 1/80]
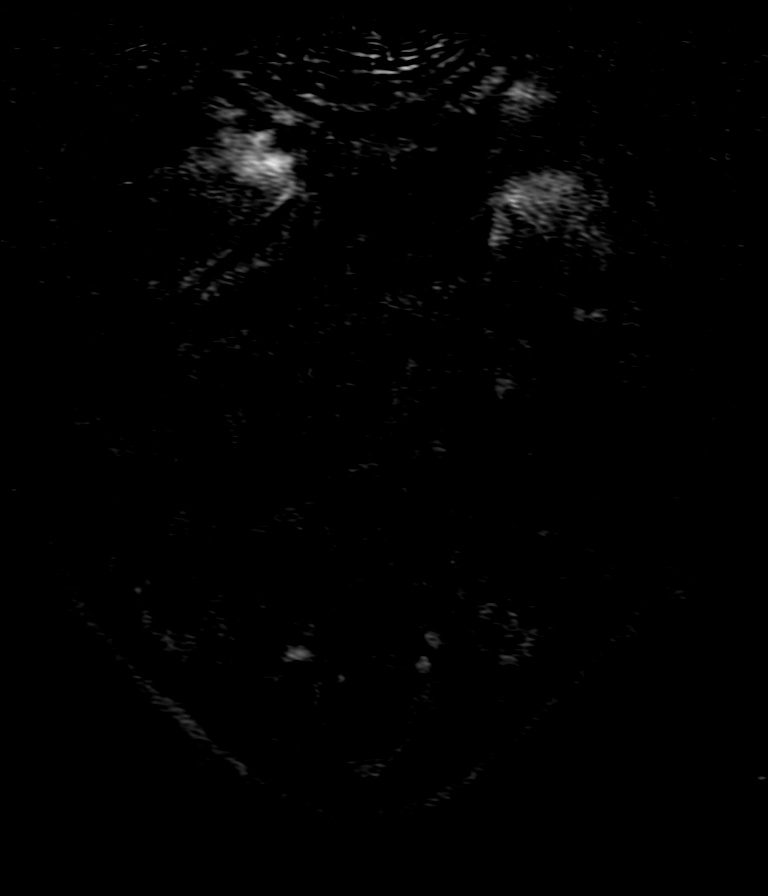

[20 of 48 positions shown; findings below may reference images not displayed]

MRA abdomen  with contrast:

Multiplanar multisequence MR of the abdomen  including 3-D dynamic MRA of the
abdominal aorta and its  branches using 40  mL Omniscan IV.  

Comparison 09/24/2004.  
The abdominal aorta is mildly tortuous without significant atheromatous
irregularity. Celiac axis and SMA widely patent proximally. Single left renal
artery, widely patent. Single right renal artery, widely patent. Inferior
mesenteric artery is patent proximally. Iliac arteries are tortuous without
significant stenosis. 
Kidneys normal in morphology without focal lesion or hydronephrosis. Nonspecific
14 mm high signal lesion in the caudate lobe of the liver, near the IVC,
incompletely characterized. This lesion is slight more prominent than on
previous exam.
IMPRESSION: 1. Negative for renal artery stenosis.
2. Nonspecific  liver lesion, without the typical imaging appearance of cyst,
and slightly increased in prominence since 09/24/2004. Consider dedicated  
liver CT or MR for further characterization.

## 2006-12-16 ENCOUNTER — Ambulatory Visit: Payer: Self-pay | Admitting: Family Medicine

## 2006-12-20 ENCOUNTER — Ambulatory Visit (HOSPITAL_COMMUNITY): Admission: RE | Admit: 2006-12-20 | Discharge: 2006-12-20 | Payer: Self-pay | Admitting: Family Medicine

## 2007-01-24 ENCOUNTER — Encounter: Payer: Self-pay | Admitting: Family Medicine

## 2007-01-24 LAB — CONVERTED CEMR LAB
ALT: 31 units/L (ref 0–35)
AST: 27 units/L (ref 0–37)
Albumin: 4.3 g/dL (ref 3.5–5.2)
Alkaline Phosphatase: 80 units/L (ref 39–117)
BUN: 19 mg/dL (ref 6–23)
Bilirubin, Direct: 0.1 mg/dL (ref 0.0–0.3)
CO2: 23 meq/L (ref 19–32)
Calcium: 9.6 mg/dL (ref 8.4–10.5)
Chloride: 109 meq/L (ref 96–112)
Cholesterol: 162 mg/dL (ref 0–200)
Creatinine, Ser: 1.23 mg/dL — ABNORMAL HIGH (ref 0.40–1.20)
Glucose, Bld: 70 mg/dL (ref 70–99)
HDL: 44 mg/dL (ref >39)
Hgb A1c MFr Bld: 6.2 % — ABNORMAL HIGH (ref 4.6–6.1)
Indirect Bilirubin: 0.3 mg/dL (ref 0.0–0.9)
LDL Cholesterol: 94 mg/dL (ref 0–99)
Potassium: 4.8 meq/L (ref 3.5–5.3)
Sodium: 143 meq/L (ref 135–145)
Total Bilirubin: 0.4 mg/dL (ref 0.3–1.2)
Total CHOL/HDL Ratio: 3.7
Total Protein: 7.2 g/dL (ref 6.0–8.3)
Triglycerides: 122 mg/dL (ref <150)
VLDL: 24 mg/dL (ref 0–40)

## 2007-01-27 ENCOUNTER — Ambulatory Visit: Payer: Self-pay | Admitting: Family Medicine

## 2007-02-07 ENCOUNTER — Ambulatory Visit: Payer: Self-pay | Admitting: Gastroenterology

## 2007-03-02 ENCOUNTER — Ambulatory Visit: Payer: Self-pay | Admitting: Family Medicine

## 2007-03-03 ENCOUNTER — Encounter: Payer: Self-pay | Admitting: Family Medicine

## 2007-03-03 LAB — CONVERTED CEMR LAB: Microalb, Ur: <0.2 mg/dL (ref 0.00–1.89)

## 2007-03-04 IMAGING — CR DG CHEST 1V PORT
1 series · 1 of 1 positions shown · non-contrast
Comparison: 05/18/2005.

CLINICAL DATA: Shortness of breath.
 PORTABLE CHEST ? 08/11/2005:

[view not recorded]
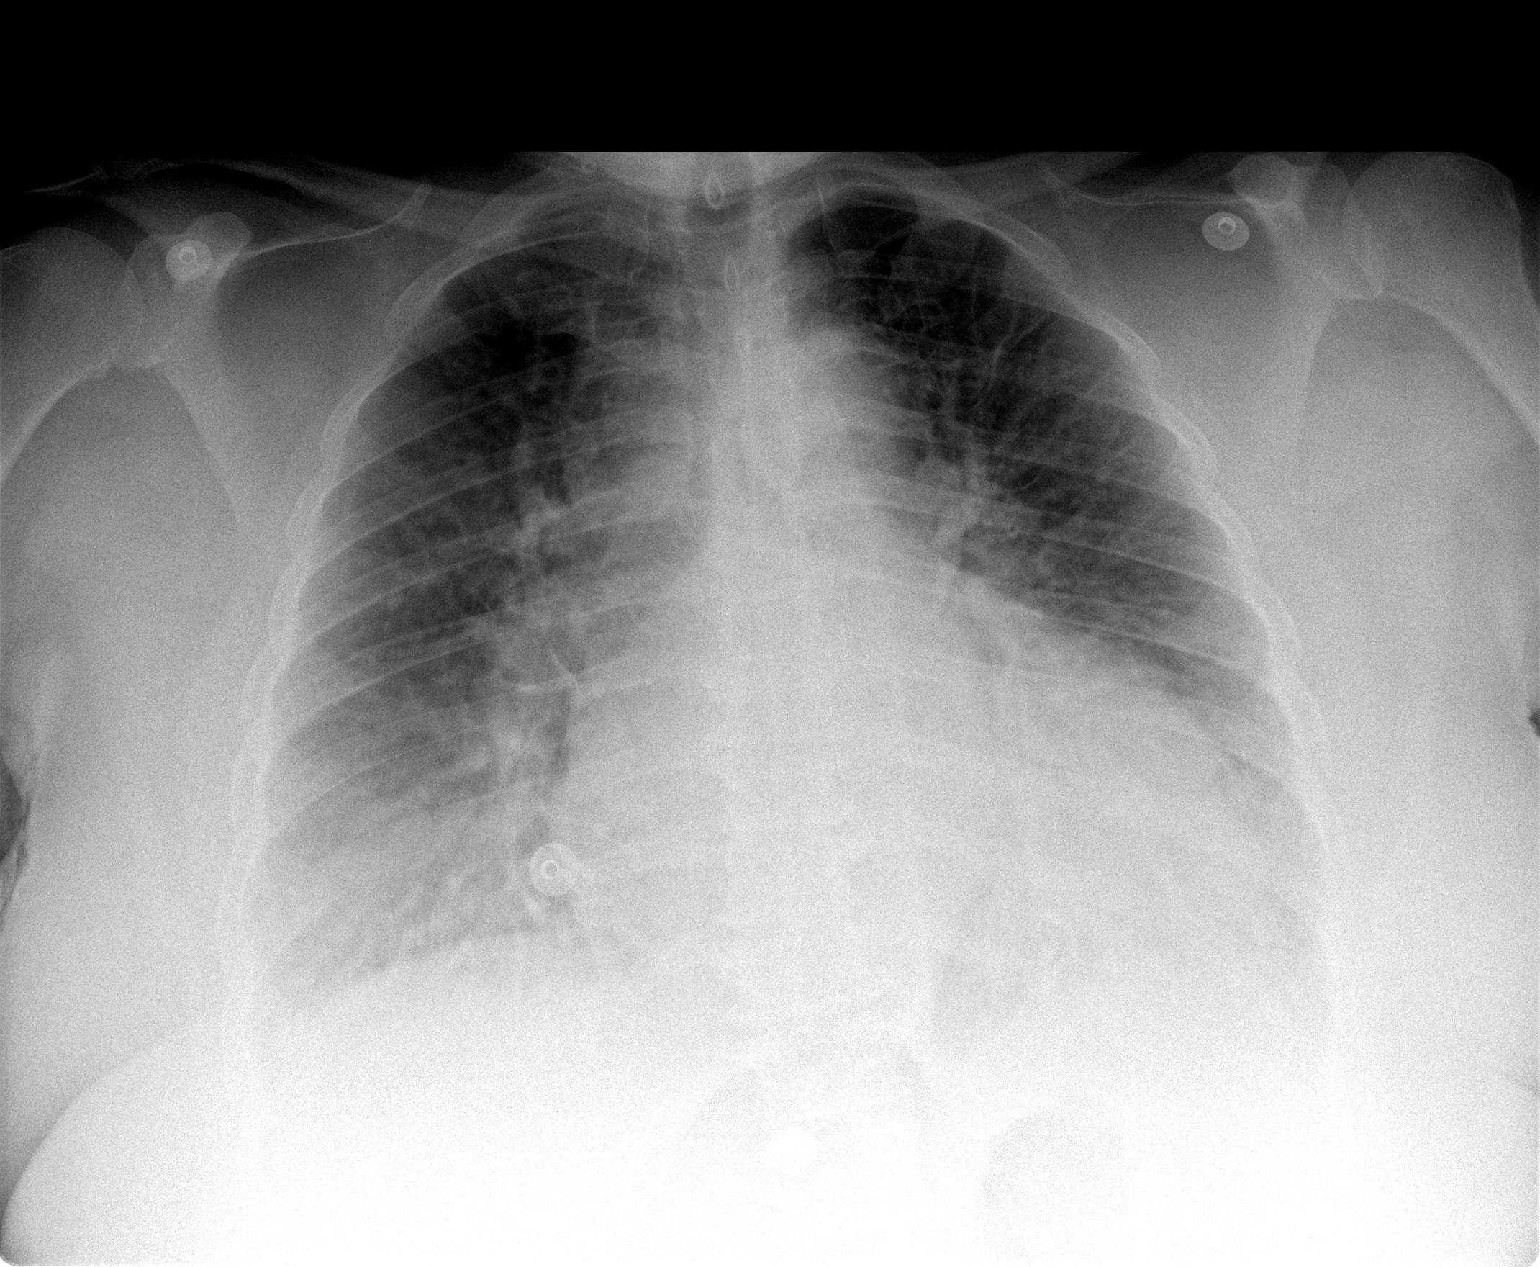

[1 of 1 positions shown; findings below may reference images not displayed]

FINDINGS: There is cardiomegaly and alveolar and interstitial opacities compatible with pulmonary edema.  There are small bilateral effusions.
IMPRESSION: Findings compatible with congestive heart failure.

## 2007-04-25 ENCOUNTER — Ambulatory Visit: Payer: Self-pay | Admitting: Family Medicine

## 2007-04-25 LAB — CONVERTED CEMR LAB
ALT: 34 units/L (ref 0–35)
AST: 22 units/L (ref 0–37)
Albumin: 4.3 g/dL (ref 3.5–5.2)
Alkaline Phosphatase: 75 units/L (ref 39–117)
Basophils Absolute: 0.1 10*3/uL (ref 0.0–0.1)
Basophils Relative: 1 % (ref 0–1)
Bilirubin, Direct: 0.1 mg/dL (ref 0.0–0.3)
Cholesterol: 138 mg/dL (ref 0–200)
Eosinophils Absolute: 0.2 10*3/uL (ref 0.0–0.7)
Eosinophils Relative: 3 % (ref 0–5)
HCT: 41.2 % (ref 36.0–46.0)
HDL: 49 mg/dL (ref >39)
Hemoglobin: 13 g/dL (ref 12.0–15.0)
Indirect Bilirubin: 0.3 mg/dL (ref 0.0–0.9)
LDL Cholesterol: 63 mg/dL (ref 0–99)
Lymphocytes Relative: 29 % (ref 12–46)
Lymphs Abs: 1.8 10*3/uL (ref 0.7–3.3)
MCHC: 31.6 g/dL (ref 30.0–36.0)
MCV: 94.1 fL (ref 78.0–100.0)
Monocytes Absolute: 0.4 10*3/uL (ref 0.2–0.7)
Monocytes Relative: 6 % (ref 3–11)
Neutro Abs: 3.8 10*3/uL (ref 1.7–7.7)
Neutrophils Relative %: 62 % (ref 43–77)
Platelets: 290 10*3/uL (ref 150–400)
RBC: 4.38 M/uL (ref 3.87–5.11)
RDW: 13.7 % (ref 11.5–14.0)
Total Bilirubin: 0.4 mg/dL (ref 0.3–1.2)
Total CHOL/HDL Ratio: 2.8
Total Protein: 6.9 g/dL (ref 6.0–8.3)
Triglycerides: 128 mg/dL (ref <150)
VLDL: 26 mg/dL (ref 0–40)
WBC: 6.2 10*3/uL (ref 4.0–10.5)

## 2007-07-13 ENCOUNTER — Ambulatory Visit: Payer: Self-pay | Admitting: Family Medicine

## 2007-08-29 ENCOUNTER — Ambulatory Visit: Payer: Self-pay | Admitting: Family Medicine

## 2007-08-30 ENCOUNTER — Encounter: Payer: Self-pay | Admitting: Family Medicine

## 2007-08-30 LAB — CONVERTED CEMR LAB
Candida species: NEGATIVE
Chlamydia, DNA Probe: NEGATIVE
GC Probe Amp, Genital: NEGATIVE
Gardnerella vaginalis: POSITIVE — AB
Trichomonal Vaginitis: NEGATIVE

## 2007-10-03 ENCOUNTER — Ambulatory Visit (HOSPITAL_COMMUNITY): Admission: RE | Admit: 2007-10-03 | Discharge: 2007-10-03 | Payer: Self-pay | Admitting: Family Medicine

## 2007-10-19 ENCOUNTER — Ambulatory Visit: Payer: Self-pay | Admitting: Gastroenterology

## 2007-11-10 ENCOUNTER — Ambulatory Visit: Payer: Self-pay | Admitting: Family Medicine

## 2007-11-17 ENCOUNTER — Encounter (HOSPITAL_COMMUNITY): Admission: RE | Admit: 2007-11-17 | Discharge: 2007-12-17 | Payer: Self-pay | Admitting: Family Medicine

## 2007-12-20 ENCOUNTER — Encounter (HOSPITAL_COMMUNITY): Admission: RE | Admit: 2007-12-20 | Discharge: 2008-01-19 | Payer: Self-pay | Admitting: Family Medicine

## 2007-12-21 ENCOUNTER — Encounter: Payer: Self-pay | Admitting: Family Medicine

## 2007-12-21 LAB — CONVERTED CEMR LAB
ALT: 28 units/L (ref 0–35)
AST: 17 units/L (ref 0–37)
Albumin: 4.1 g/dL (ref 3.5–5.2)
Alkaline Phosphatase: 82 units/L (ref 39–117)
BUN: 17 mg/dL (ref 6–23)
Bilirubin, Direct: 0.1 mg/dL (ref 0.0–0.3)
CO2: 24 meq/L (ref 19–32)
Calcium: 9.5 mg/dL (ref 8.4–10.5)
Chloride: 104 meq/L (ref 96–112)
Cholesterol: 148 mg/dL (ref 0–200)
Creatinine, Ser: 1.1 mg/dL (ref 0.40–1.20)
Glucose, Bld: 99 mg/dL (ref 70–99)
HDL: 48 mg/dL (ref >39)
Indirect Bilirubin: 0.2 mg/dL (ref 0.0–0.9)
LDL Cholesterol: 65 mg/dL (ref 0–99)
Potassium: 4.8 meq/L (ref 3.5–5.3)
Sodium: 143 meq/L (ref 135–145)
Total Bilirubin: 0.3 mg/dL (ref 0.3–1.2)
Total CHOL/HDL Ratio: 3.1
Total Protein: 7.1 g/dL (ref 6.0–8.3)
Triglycerides: 173 mg/dL — ABNORMAL HIGH (ref <150)
VLDL: 35 mg/dL (ref 0–40)

## 2008-01-25 ENCOUNTER — Ambulatory Visit: Payer: Self-pay | Admitting: Family Medicine

## 2008-01-31 ENCOUNTER — Encounter (INDEPENDENT_AMBULATORY_CARE_PROVIDER_SITE_OTHER): Payer: Self-pay | Admitting: *Deleted

## 2008-01-31 DIAGNOSIS — E785 Hyperlipidemia, unspecified: Secondary | ICD-10-CM | POA: Insufficient documentation

## 2008-01-31 DIAGNOSIS — I10 Essential (primary) hypertension: Secondary | ICD-10-CM | POA: Insufficient documentation

## 2008-02-29 ENCOUNTER — Ambulatory Visit: Payer: Self-pay | Admitting: Family Medicine

## 2008-04-17 ENCOUNTER — Emergency Department (HOSPITAL_COMMUNITY): Admission: EM | Admit: 2008-04-17 | Discharge: 2008-04-17 | Payer: Self-pay | Admitting: Emergency Medicine

## 2008-04-19 ENCOUNTER — Ambulatory Visit: Payer: Self-pay | Admitting: Family Medicine

## 2008-04-24 ENCOUNTER — Ambulatory Visit: Payer: Self-pay | Admitting: Family Medicine

## 2008-04-30 DIAGNOSIS — F1010 Alcohol abuse, uncomplicated: Secondary | ICD-10-CM | POA: Insufficient documentation

## 2008-04-30 DIAGNOSIS — E669 Obesity, unspecified: Secondary | ICD-10-CM

## 2008-04-30 DIAGNOSIS — E119 Type 2 diabetes mellitus without complications: Secondary | ICD-10-CM | POA: Insufficient documentation

## 2008-04-30 DIAGNOSIS — E1169 Type 2 diabetes mellitus with other specified complication: Secondary | ICD-10-CM | POA: Insufficient documentation

## 2008-06-05 ENCOUNTER — Ambulatory Visit: Payer: Self-pay | Admitting: Family Medicine

## 2008-06-05 LAB — CONVERTED CEMR LAB: Glucose, Bld: 110 mg/dL

## 2008-06-07 ENCOUNTER — Telehealth: Payer: Self-pay | Admitting: Family Medicine

## 2008-06-21 ENCOUNTER — Telehealth: Payer: Self-pay | Admitting: Family Medicine

## 2008-06-29 ENCOUNTER — Emergency Department (HOSPITAL_COMMUNITY): Admission: EM | Admit: 2008-06-29 | Discharge: 2008-06-29 | Payer: Self-pay | Admitting: Emergency Medicine

## 2008-07-12 IMAGING — CR DG LUMBAR SPINE COMPLETE 4+V
6 series · 6 of 6 positions shown · non-contrast
Comparison: none

HISTORY: Upper and lower back pain

THORACIC SPINE 3 VIEWS:
12 pairs of ribs.
Broad-based dextro-convex scoliosis thoracolumbar spine apex T10.
Multilevel disc space narrowing and endplate spur formation.
No fracture, subluxation, or bone destruction.
Diffuse bony demineralization.

[view not recorded (1 of 6)]
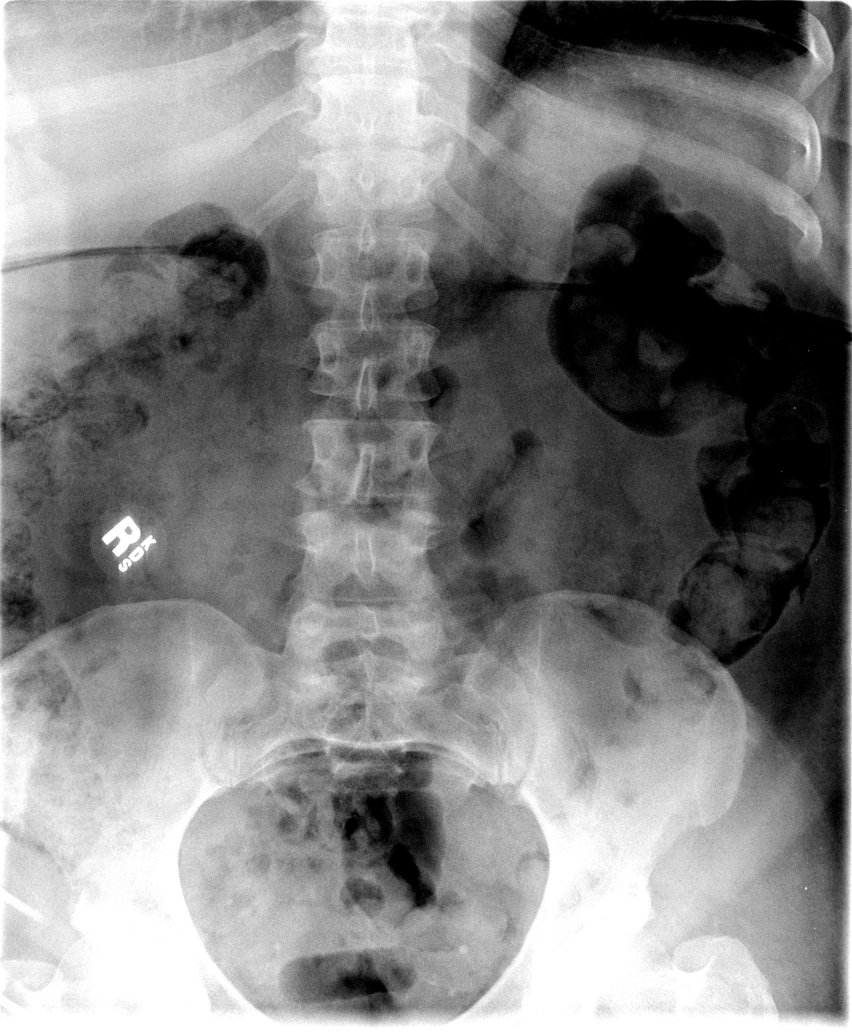

[view not recorded (2 of 6)]
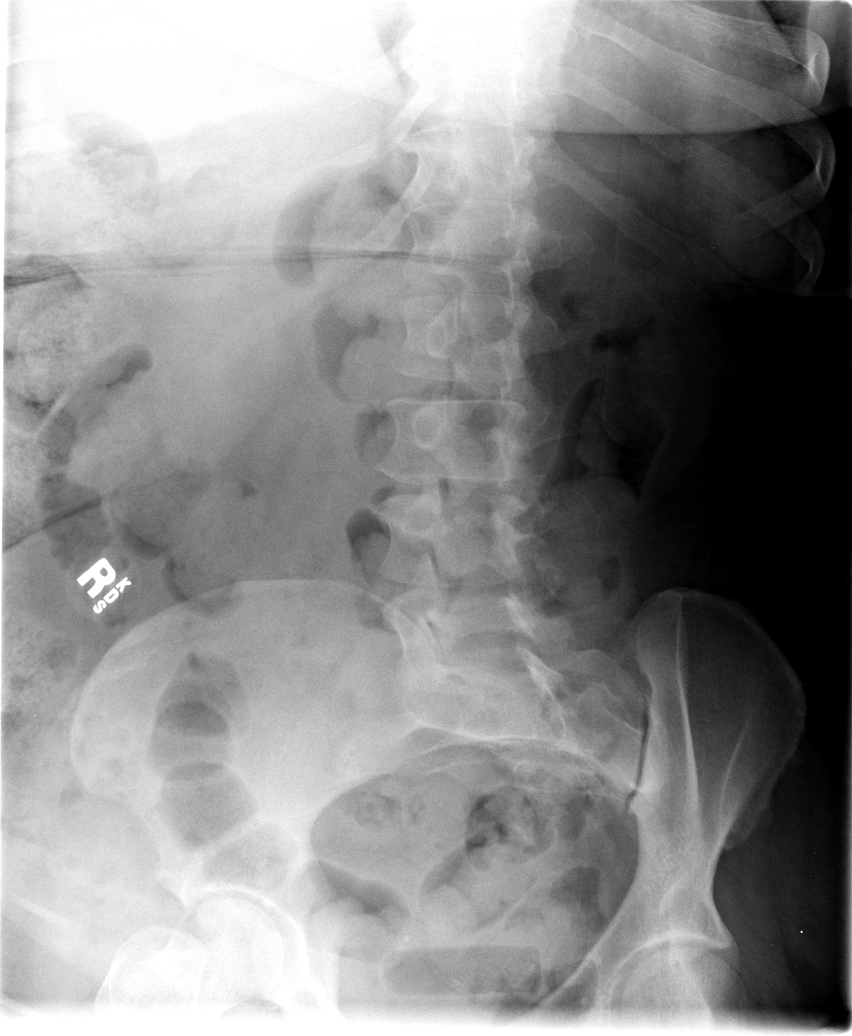

[view not recorded (3 of 6)]
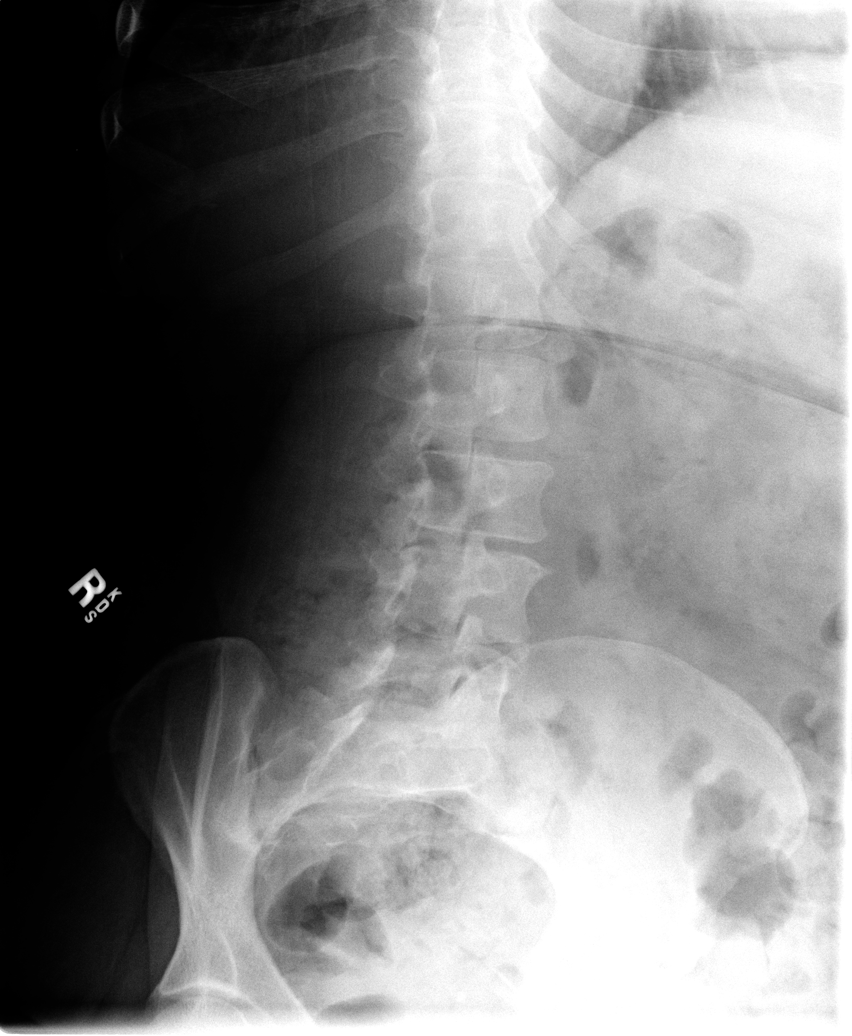

[view not recorded (4 of 6)]
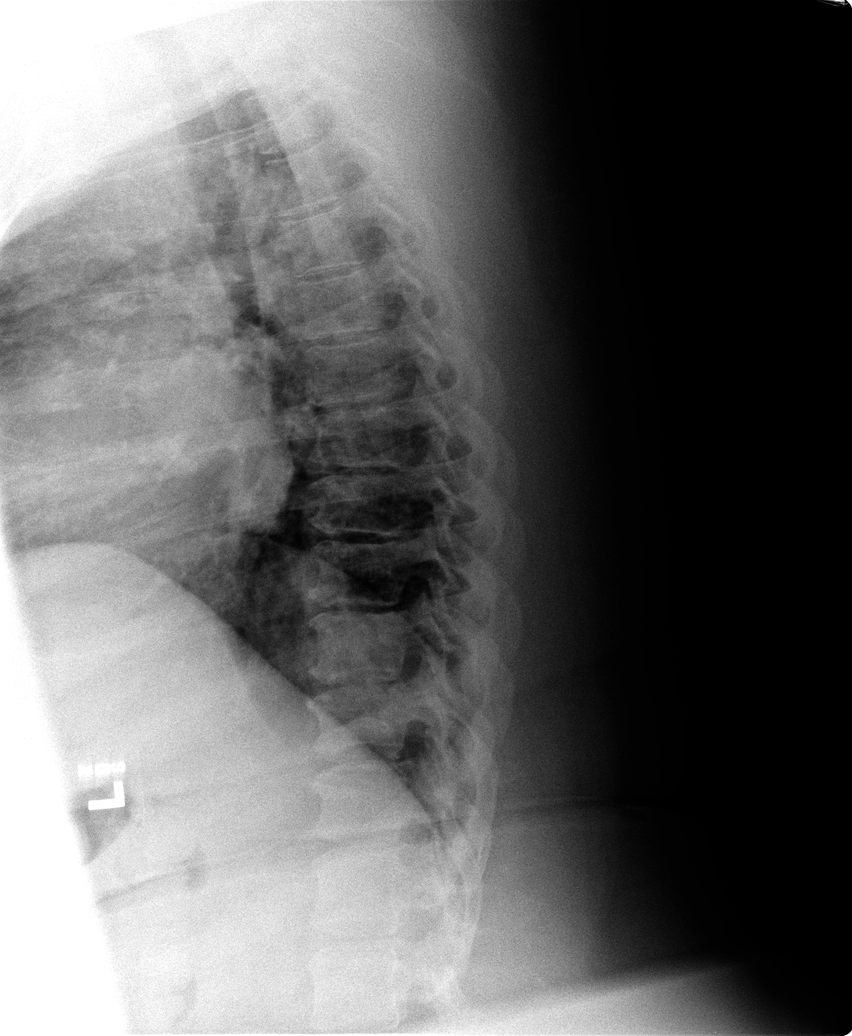

[view not recorded (5 of 6)]
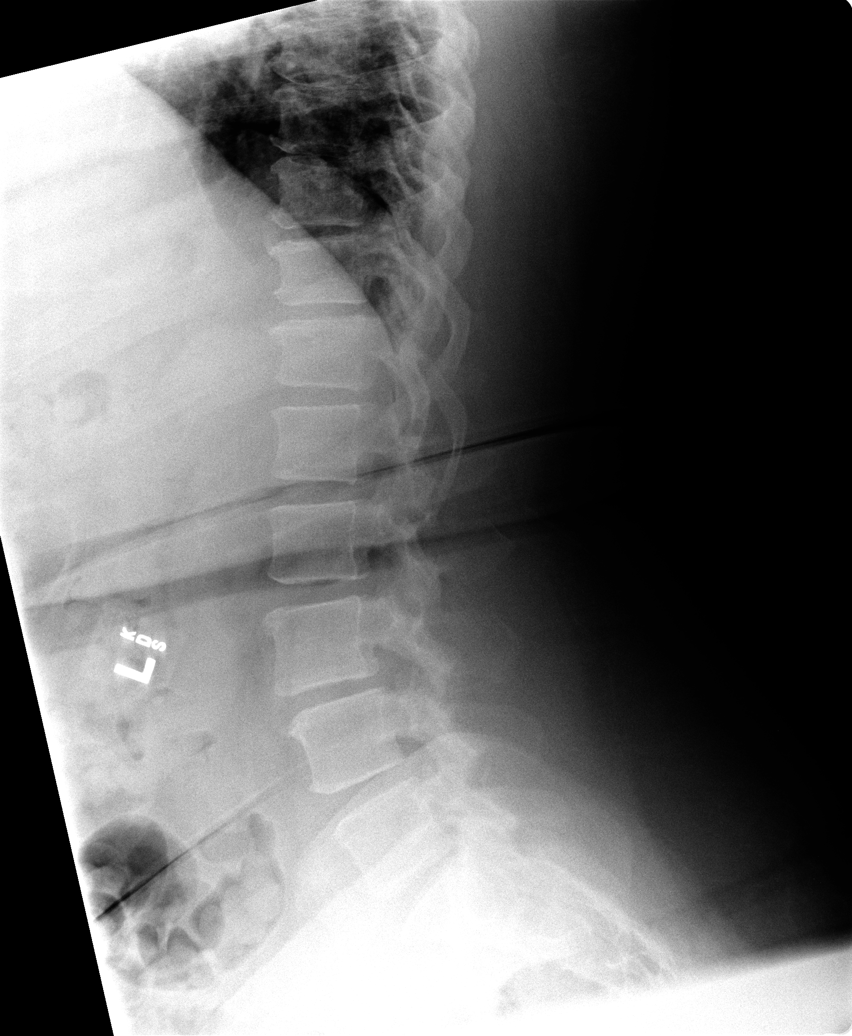

[view not recorded (6 of 6)]
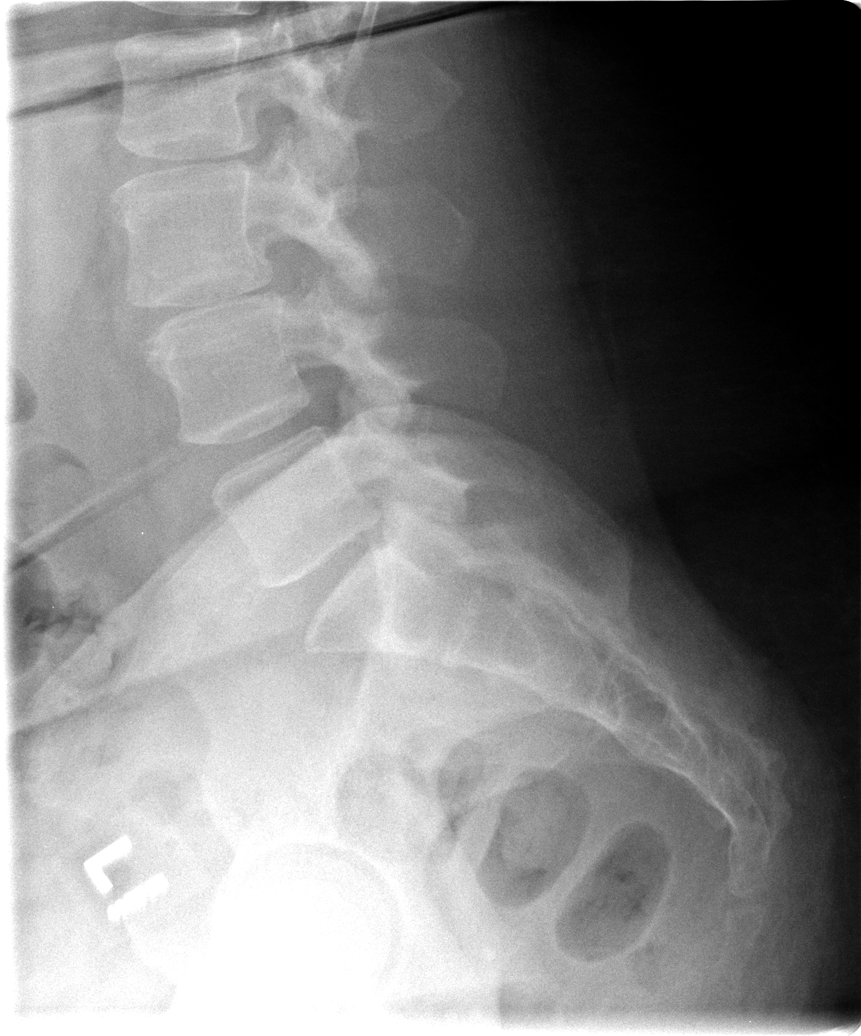

[6 of 6 positions shown; findings below may reference images not displayed]

IMPRESSION: Multilevel degenerative disc disease changes thoracic spine with mild scoliosis.
No acute abnormalities.

LUMBAR SPINE 4 VIEWS:

Five lumbar vertebra.
Mild diffuse bony demineralization.
Mild disc space narrowing with minimal anterolisthesis L3-L4.
No fracture or additional subluxation.
No spondylolysis or bone destruction.
IMPRESSION: Degenerative disc disease changes L3-L4 with minimal anterolisthesis.
No acute abnormalities.

## 2008-07-12 IMAGING — CR DG THORACIC SPINE 2V
3 series · 3 of 3 positions shown · non-contrast
Comparison: none

HISTORY: Upper and lower back pain

THORACIC SPINE 3 VIEWS:
12 pairs of ribs.
Broad-based dextro-convex scoliosis thoracolumbar spine apex T10.
Multilevel disc space narrowing and endplate spur formation.
No fracture, subluxation, or bone destruction.
Diffuse bony demineralization.

[view not recorded (1 of 3)]
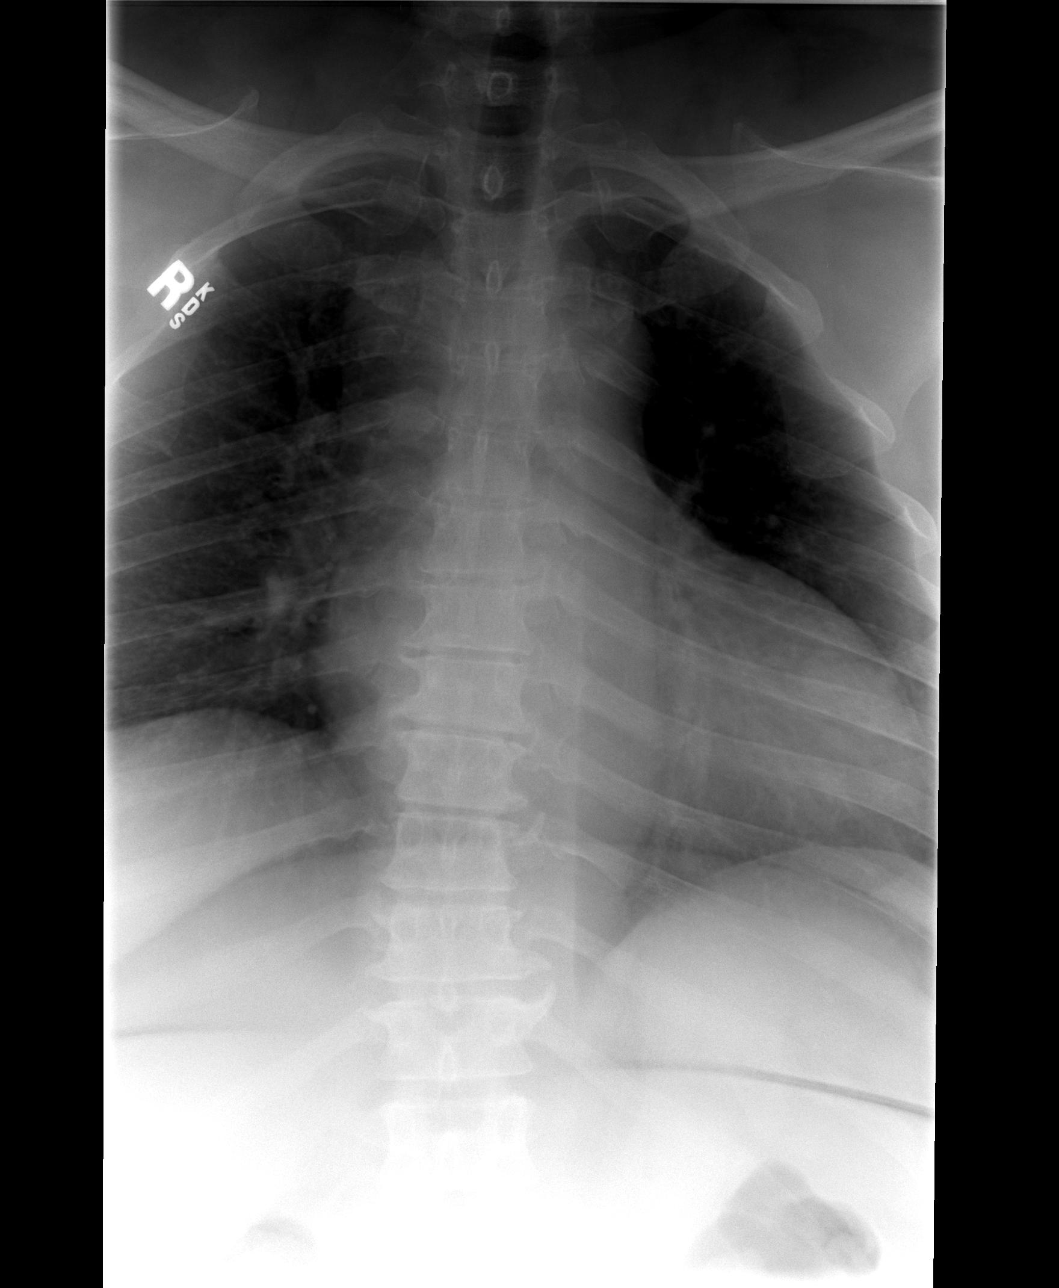

[view not recorded (2 of 3)]
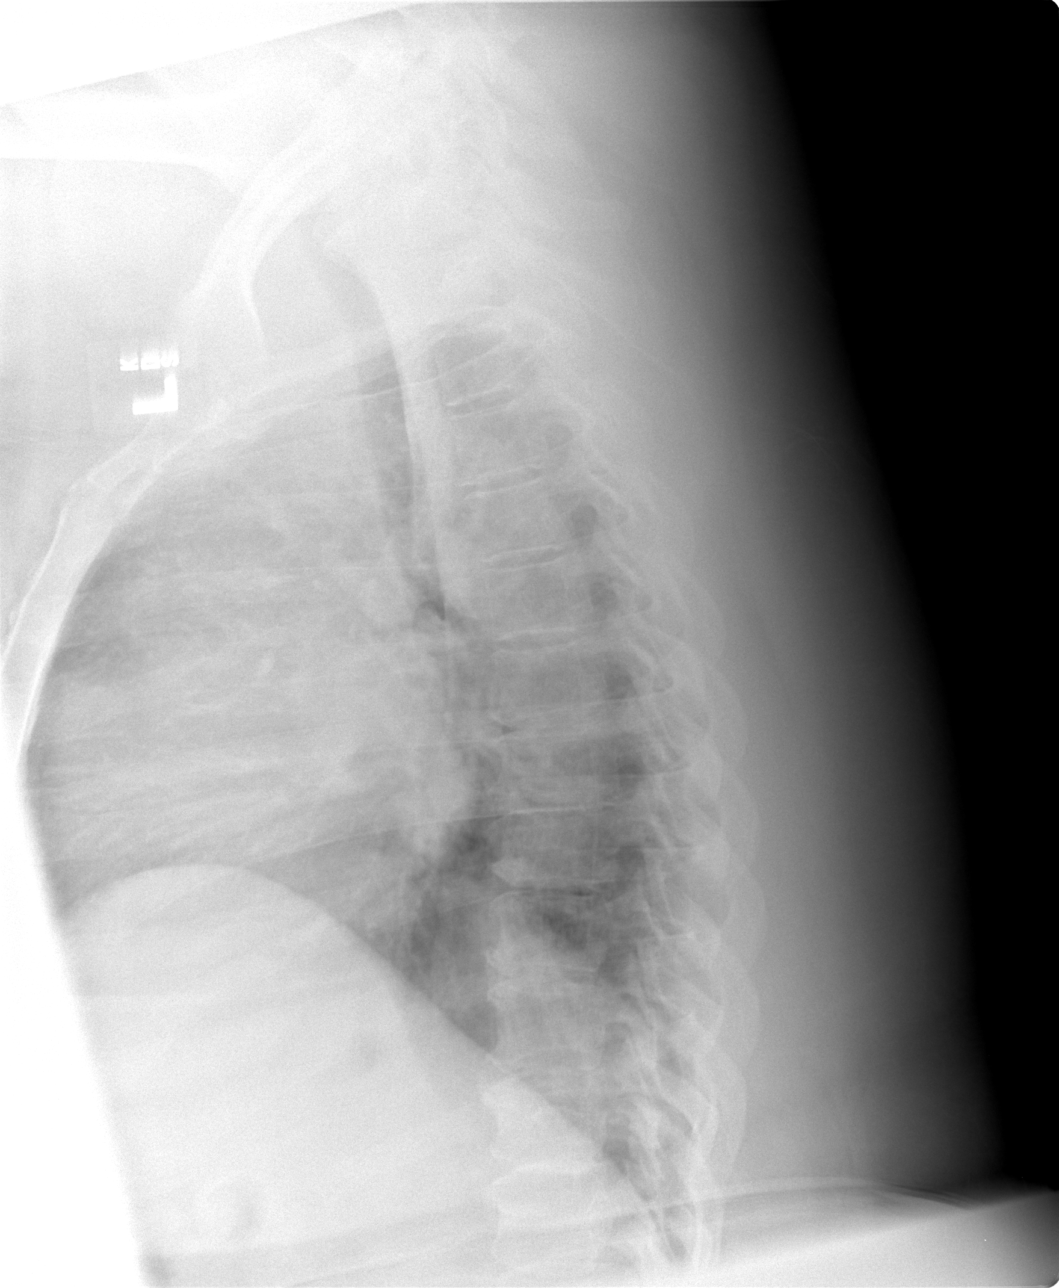

[view not recorded (3 of 3)]
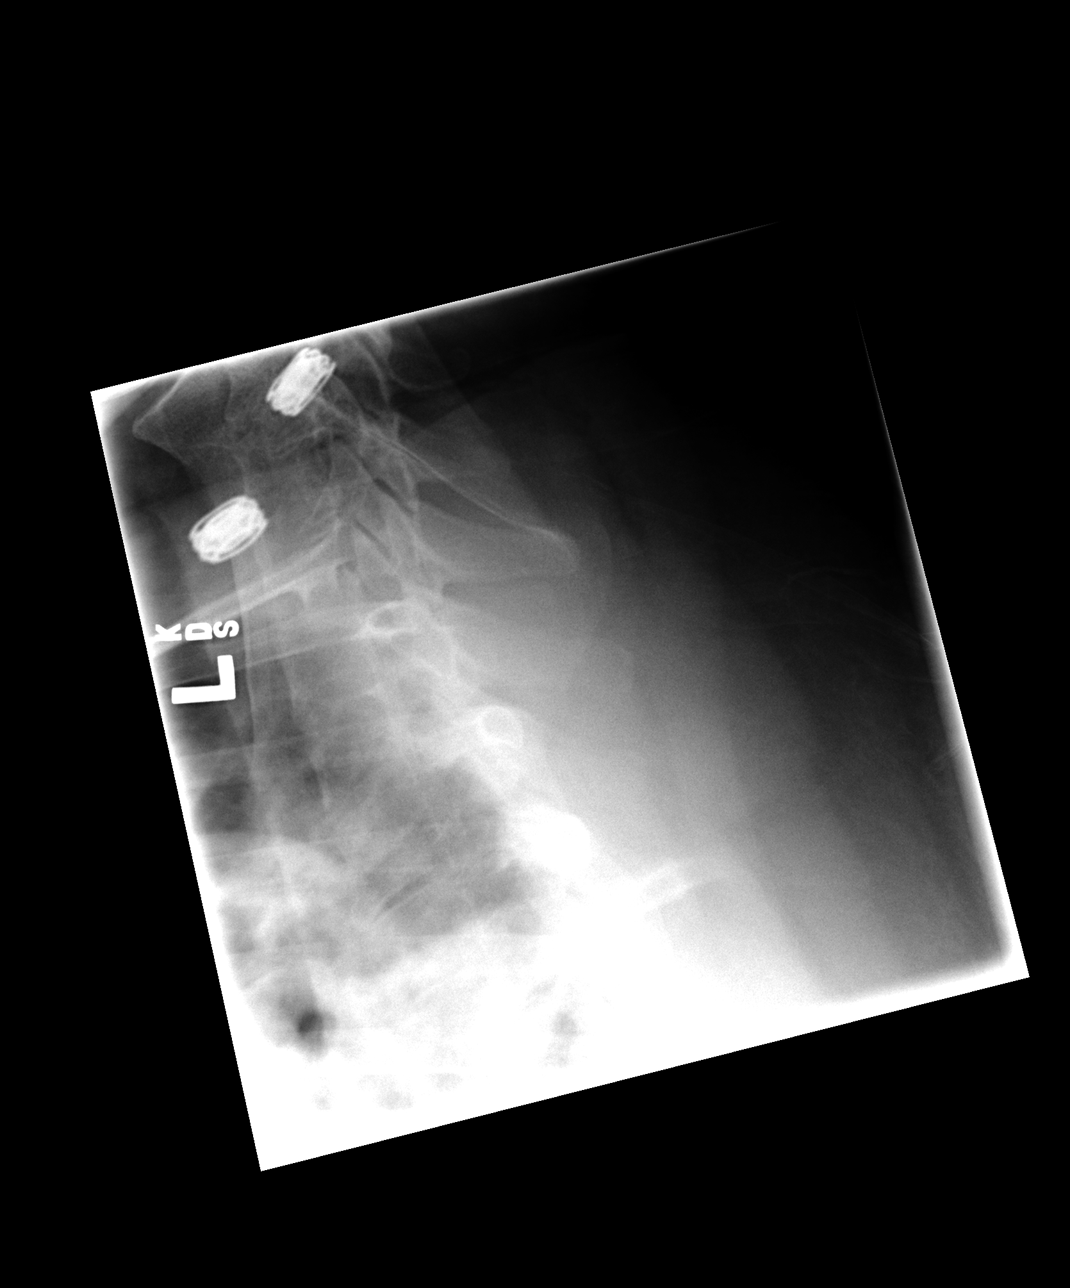

[3 of 3 positions shown; findings below may reference images not displayed]

IMPRESSION: Multilevel degenerative disc disease changes thoracic spine with mild scoliosis.
No acute abnormalities.

LUMBAR SPINE 4 VIEWS:

Five lumbar vertebra.
Mild diffuse bony demineralization.
Mild disc space narrowing with minimal anterolisthesis L3-L4.
No fracture or additional subluxation.
No spondylolysis or bone destruction.
IMPRESSION: Degenerative disc disease changes L3-L4 with minimal anterolisthesis.
No acute abnormalities.

## 2008-07-18 ENCOUNTER — Encounter: Payer: Self-pay | Admitting: Family Medicine

## 2008-07-18 LAB — CONVERTED CEMR LAB
ALT: 14 units/L (ref 0–35)
AST: 14 units/L (ref 0–37)
Albumin: 3.9 g/dL (ref 3.5–5.2)
Alkaline Phosphatase: 63 units/L (ref 39–117)
BUN: 14 mg/dL (ref 6–23)
Bilirubin, Direct: 0.1 mg/dL (ref 0.0–0.3)
CO2: 24 meq/L (ref 19–32)
Calcium: 9.6 mg/dL (ref 8.4–10.5)
Chloride: 108 meq/L (ref 96–112)
Cholesterol: 140 mg/dL (ref 0–200)
Creatinine, Ser: 0.93 mg/dL (ref 0.40–1.20)
Glucose, Bld: 100 mg/dL — ABNORMAL HIGH (ref 70–99)
HDL: 40 mg/dL (ref >39)
Indirect Bilirubin: 0.3 mg/dL (ref 0.0–0.9)
LDL Cholesterol: 75 mg/dL (ref 0–99)
Potassium: 4.3 meq/L (ref 3.5–5.3)
Sodium: 141 meq/L (ref 135–145)
Total Bilirubin: 0.4 mg/dL (ref 0.3–1.2)
Total CHOL/HDL Ratio: 3.5
Total Protein: 6.3 g/dL (ref 6.0–8.3)
Triglycerides: 127 mg/dL (ref <150)
VLDL: 25 mg/dL (ref 0–40)

## 2008-07-19 ENCOUNTER — Encounter: Payer: Self-pay | Admitting: Family Medicine

## 2008-07-24 ENCOUNTER — Ambulatory Visit: Payer: Self-pay | Admitting: Family Medicine

## 2008-07-24 DIAGNOSIS — M25519 Pain in unspecified shoulder: Secondary | ICD-10-CM | POA: Insufficient documentation

## 2008-07-24 LAB — CONVERTED CEMR LAB
Glucose, Bld: 130 mg/dL
Hgb A1c MFr Bld: 6.6 %

## 2008-07-27 ENCOUNTER — Telehealth: Payer: Self-pay | Admitting: Family Medicine

## 2008-08-03 ENCOUNTER — Encounter: Payer: Self-pay | Admitting: Family Medicine

## 2008-08-14 ENCOUNTER — Encounter: Payer: Self-pay | Admitting: Family Medicine

## 2008-08-20 ENCOUNTER — Ambulatory Visit: Payer: Self-pay | Admitting: Orthopedic Surgery

## 2008-08-20 DIAGNOSIS — M67919 Unspecified disorder of synovium and tendon, unspecified shoulder: Secondary | ICD-10-CM | POA: Insufficient documentation

## 2008-08-20 DIAGNOSIS — M719 Bursopathy, unspecified: Secondary | ICD-10-CM

## 2008-09-04 ENCOUNTER — Ambulatory Visit: Payer: Self-pay | Admitting: Family Medicine

## 2008-09-04 LAB — CONVERTED CEMR LAB: Glucose, Bld: 112 mg/dL

## 2008-09-11 ENCOUNTER — Encounter: Payer: Self-pay | Admitting: Family Medicine

## 2008-10-03 ENCOUNTER — Ambulatory Visit (HOSPITAL_COMMUNITY): Admission: RE | Admit: 2008-10-03 | Discharge: 2008-10-03 | Payer: Self-pay | Admitting: Family Medicine

## 2008-10-09 ENCOUNTER — Encounter: Payer: Self-pay | Admitting: Family Medicine

## 2008-10-15 ENCOUNTER — Encounter: Payer: Self-pay | Admitting: Family Medicine

## 2008-10-17 ENCOUNTER — Telehealth: Payer: Self-pay | Admitting: Family Medicine

## 2008-10-22 ENCOUNTER — Ambulatory Visit: Payer: Self-pay | Admitting: Family Medicine

## 2008-10-22 DIAGNOSIS — M549 Dorsalgia, unspecified: Secondary | ICD-10-CM | POA: Insufficient documentation

## 2008-10-23 ENCOUNTER — Encounter: Payer: Self-pay | Admitting: Family Medicine

## 2008-11-06 ENCOUNTER — Ambulatory Visit: Payer: Self-pay | Admitting: Gastroenterology

## 2008-11-07 ENCOUNTER — Ambulatory Visit: Payer: Self-pay | Admitting: Family Medicine

## 2008-11-07 DIAGNOSIS — R5383 Other fatigue: Secondary | ICD-10-CM

## 2008-11-07 DIAGNOSIS — R5381 Other malaise: Secondary | ICD-10-CM | POA: Insufficient documentation

## 2008-11-07 LAB — CONVERTED CEMR LAB
Glucose, Bld: 138 mg/dL
Hgb A1c MFr Bld: 6 %

## 2008-11-08 ENCOUNTER — Encounter: Payer: Self-pay | Admitting: Family Medicine

## 2008-11-08 LAB — CONVERTED CEMR LAB
Creatinine, Urine: 80 mg/dL
Microalb Creat Ratio: 2.5 mg/g (ref 0.0–30.0)
Microalb, Ur: 0.2 mg/dL (ref 0.00–1.89)

## 2008-11-29 ENCOUNTER — Encounter: Payer: Self-pay | Admitting: Family Medicine

## 2008-12-07 ENCOUNTER — Encounter: Payer: Self-pay | Admitting: Family Medicine

## 2008-12-11 ENCOUNTER — Encounter: Payer: Self-pay | Admitting: Family Medicine

## 2008-12-12 ENCOUNTER — Telehealth: Payer: Self-pay | Admitting: Family Medicine

## 2008-12-17 ENCOUNTER — Encounter: Payer: Self-pay | Admitting: Family Medicine

## 2008-12-28 ENCOUNTER — Encounter: Payer: Self-pay | Admitting: Family Medicine

## 2008-12-31 ENCOUNTER — Encounter: Payer: Self-pay | Admitting: Family Medicine

## 2009-01-01 ENCOUNTER — Telehealth: Payer: Self-pay | Admitting: Family Medicine

## 2009-01-02 ENCOUNTER — Encounter: Payer: Self-pay | Admitting: Family Medicine

## 2009-01-09 ENCOUNTER — Telehealth: Payer: Self-pay | Admitting: Family Medicine

## 2009-02-14 ENCOUNTER — Encounter: Payer: Self-pay | Admitting: Family Medicine

## 2009-02-14 ENCOUNTER — Telehealth: Payer: Self-pay | Admitting: Family Medicine

## 2009-02-15 ENCOUNTER — Telehealth: Payer: Self-pay | Admitting: Family Medicine

## 2009-02-15 LAB — CONVERTED CEMR LAB
BUN: 17 mg/dL (ref 6–23)
Basophils Absolute: 0 10*3/uL (ref 0.0–0.1)
Basophils Relative: 0 % (ref 0–1)
CO2: 23 meq/L (ref 19–32)
Calcium: 9.3 mg/dL (ref 8.4–10.5)
Chloride: 110 meq/L (ref 96–112)
Cholesterol: 208 mg/dL — ABNORMAL HIGH (ref 0–200)
Creatinine, Ser: 0.87 mg/dL (ref 0.40–1.20)
Creatinine, Urine: 91.1 mg/dL
Eosinophils Absolute: 0.2 10*3/uL (ref 0.0–0.7)
Eosinophils Relative: 3 % (ref 0–5)
Glucose, Bld: 90 mg/dL (ref 70–99)
HCT: 38.9 % (ref 36.0–46.0)
HDL: 57 mg/dL (ref >39)
Hemoglobin: 12.4 g/dL (ref 12.0–15.0)
LDL Cholesterol: 130 mg/dL — ABNORMAL HIGH (ref 0–99)
Lymphocytes Relative: 30 % (ref 12–46)
Lymphs Abs: 2.6 10*3/uL (ref 0.7–4.0)
MCHC: 31.9 g/dL (ref 30.0–36.0)
MCV: 92.6 fL (ref 78.0–100.0)
Microalb Creat Ratio: 14.4 mg/g (ref 0.0–30.0)
Microalb, Ur: 1.31 mg/dL (ref 0.00–1.89)
Monocytes Absolute: 0.5 10*3/uL (ref 0.1–1.0)
Monocytes Relative: 5 % (ref 3–12)
Neutro Abs: 5.2 10*3/uL (ref 1.7–7.7)
Neutrophils Relative %: 62 % (ref 43–77)
Platelets: 267 10*3/uL (ref 150–400)
Potassium: 4 meq/L (ref 3.5–5.3)
RBC: 4.2 M/uL (ref 3.87–5.11)
RDW: 14.9 % (ref 11.5–15.5)
Sodium: 145 meq/L (ref 135–145)
Total CHOL/HDL Ratio: 3.6
Triglycerides: 103 mg/dL (ref <150)
VLDL: 21 mg/dL (ref 0–40)
WBC: 8.5 10*3/uL (ref 4.0–10.5)

## 2009-02-18 ENCOUNTER — Ambulatory Visit: Payer: Self-pay | Admitting: Family Medicine

## 2009-02-18 LAB — CONVERTED CEMR LAB
Glucose, Bld: 89 mg/dL
Hgb A1c MFr Bld: 5.8 %

## 2009-02-22 ENCOUNTER — Encounter: Payer: Self-pay | Admitting: Family Medicine

## 2009-03-20 ENCOUNTER — Telehealth: Payer: Self-pay | Admitting: Family Medicine

## 2009-03-21 ENCOUNTER — Encounter: Payer: Self-pay | Admitting: Family Medicine

## 2009-04-11 ENCOUNTER — Ambulatory Visit: Payer: Self-pay | Admitting: Family Medicine

## 2009-04-15 ENCOUNTER — Telehealth: Payer: Self-pay | Admitting: Family Medicine

## 2009-04-15 LAB — CONVERTED CEMR LAB: TSH: 0.959 microintl units/mL (ref 0.350–4.500)

## 2009-04-16 ENCOUNTER — Encounter: Payer: Self-pay | Admitting: Family Medicine

## 2009-04-17 ENCOUNTER — Telehealth: Payer: Self-pay | Admitting: Family Medicine

## 2009-04-22 ENCOUNTER — Encounter: Payer: Self-pay | Admitting: Family Medicine

## 2009-05-08 ENCOUNTER — Encounter: Payer: Self-pay | Admitting: Family Medicine

## 2009-05-08 ENCOUNTER — Other Ambulatory Visit: Admission: RE | Admit: 2009-05-08 | Discharge: 2009-05-08 | Payer: Self-pay | Admitting: Family Medicine

## 2009-05-08 ENCOUNTER — Ambulatory Visit: Payer: Self-pay | Admitting: Family Medicine

## 2009-05-08 DIAGNOSIS — F329 Major depressive disorder, single episode, unspecified: Secondary | ICD-10-CM | POA: Insufficient documentation

## 2009-05-09 ENCOUNTER — Encounter: Payer: Self-pay | Admitting: Family Medicine

## 2009-05-09 LAB — CONVERTED CEMR LAB
Chlamydia, DNA Probe: NEGATIVE
GC Probe Amp, Genital: NEGATIVE

## 2009-05-10 ENCOUNTER — Encounter: Payer: Self-pay | Admitting: Family Medicine

## 2009-05-10 LAB — CONVERTED CEMR LAB
Candida species: NEGATIVE
Gardnerella vaginalis: NEGATIVE

## 2009-05-23 ENCOUNTER — Encounter: Payer: Self-pay | Admitting: Family Medicine

## 2009-06-06 ENCOUNTER — Telehealth: Payer: Self-pay | Admitting: Family Medicine

## 2009-06-06 ENCOUNTER — Ambulatory Visit: Payer: Self-pay | Admitting: Family Medicine

## 2009-06-07 ENCOUNTER — Encounter: Payer: Self-pay | Admitting: Family Medicine

## 2009-06-17 ENCOUNTER — Telehealth: Payer: Self-pay | Admitting: Family Medicine

## 2009-07-25 ENCOUNTER — Encounter: Payer: Self-pay | Admitting: Family Medicine

## 2009-08-14 ENCOUNTER — Telehealth: Payer: Self-pay | Admitting: Family Medicine

## 2009-08-15 ENCOUNTER — Ambulatory Visit: Payer: Self-pay | Admitting: Family Medicine

## 2009-08-15 LAB — CONVERTED CEMR LAB
Glucose, Bld: 88 mg/dL
Hgb A1c MFr Bld: 6.4 %

## 2009-08-26 ENCOUNTER — Encounter: Payer: Self-pay | Admitting: Family Medicine

## 2009-09-05 ENCOUNTER — Telehealth: Payer: Self-pay | Admitting: Family Medicine

## 2009-09-23 ENCOUNTER — Encounter: Payer: Self-pay | Admitting: Family Medicine

## 2009-09-24 ENCOUNTER — Encounter: Payer: Self-pay | Admitting: Family Medicine

## 2009-09-26 ENCOUNTER — Ambulatory Visit: Payer: Self-pay | Admitting: Family Medicine

## 2009-09-26 LAB — CONVERTED CEMR LAB: Glucose, Bld: 86 mg/dL

## 2009-09-27 ENCOUNTER — Encounter: Payer: Self-pay | Admitting: Family Medicine

## 2009-10-16 ENCOUNTER — Encounter (INDEPENDENT_AMBULATORY_CARE_PROVIDER_SITE_OTHER): Payer: Self-pay | Admitting: *Deleted

## 2009-10-17 ENCOUNTER — Encounter: Payer: Self-pay | Admitting: Family Medicine

## 2009-10-26 DIAGNOSIS — Z8719 Personal history of other diseases of the digestive system: Secondary | ICD-10-CM

## 2009-10-26 HISTORY — DX: Personal history of other diseases of the digestive system: Z87.19

## 2009-10-30 ENCOUNTER — Telehealth: Payer: Self-pay | Admitting: Family Medicine

## 2009-11-01 ENCOUNTER — Encounter: Payer: Self-pay | Admitting: Family Medicine

## 2009-11-01 LAB — CONVERTED CEMR LAB
ALT: 14 units/L (ref 0–35)
AST: 14 units/L (ref 0–37)
Albumin: 4.3 g/dL (ref 3.5–5.2)
Alkaline Phosphatase: 88 units/L (ref 39–117)
BUN: 11 mg/dL (ref 6–23)
Bilirubin, Direct: 0.1 mg/dL (ref 0.0–0.3)
CO2: 22 meq/L (ref 19–32)
Calcium: 9.6 mg/dL (ref 8.4–10.5)
Chloride: 104 meq/L (ref 96–112)
Cholesterol: 226 mg/dL — ABNORMAL HIGH (ref 0–200)
Creatinine, Ser: 1.02 mg/dL (ref 0.40–1.20)
Glucose, Bld: 79 mg/dL (ref 70–99)
HDL: 53 mg/dL (ref >39)
Indirect Bilirubin: 0.5 mg/dL (ref 0.0–0.9)
LDL Cholesterol: 141 mg/dL — ABNORMAL HIGH (ref 0–99)
Potassium: 4.2 meq/L (ref 3.5–5.3)
Sodium: 140 meq/L (ref 135–145)
Total Bilirubin: 0.6 mg/dL (ref 0.3–1.2)
Total CHOL/HDL Ratio: 4.3
Total Protein: 7 g/dL (ref 6.0–8.3)
Triglycerides: 160 mg/dL — ABNORMAL HIGH (ref <150)
VLDL: 32 mg/dL (ref 0–40)

## 2009-11-08 IMAGING — CR DG CHEST 2V
2 series · 2 of 2 positions shown · non-contrast
Comparison: 12/21/2007

CLINICAL DATA: Chest pain

CHEST - 2 VIEW

[view not recorded (1 of 2)]
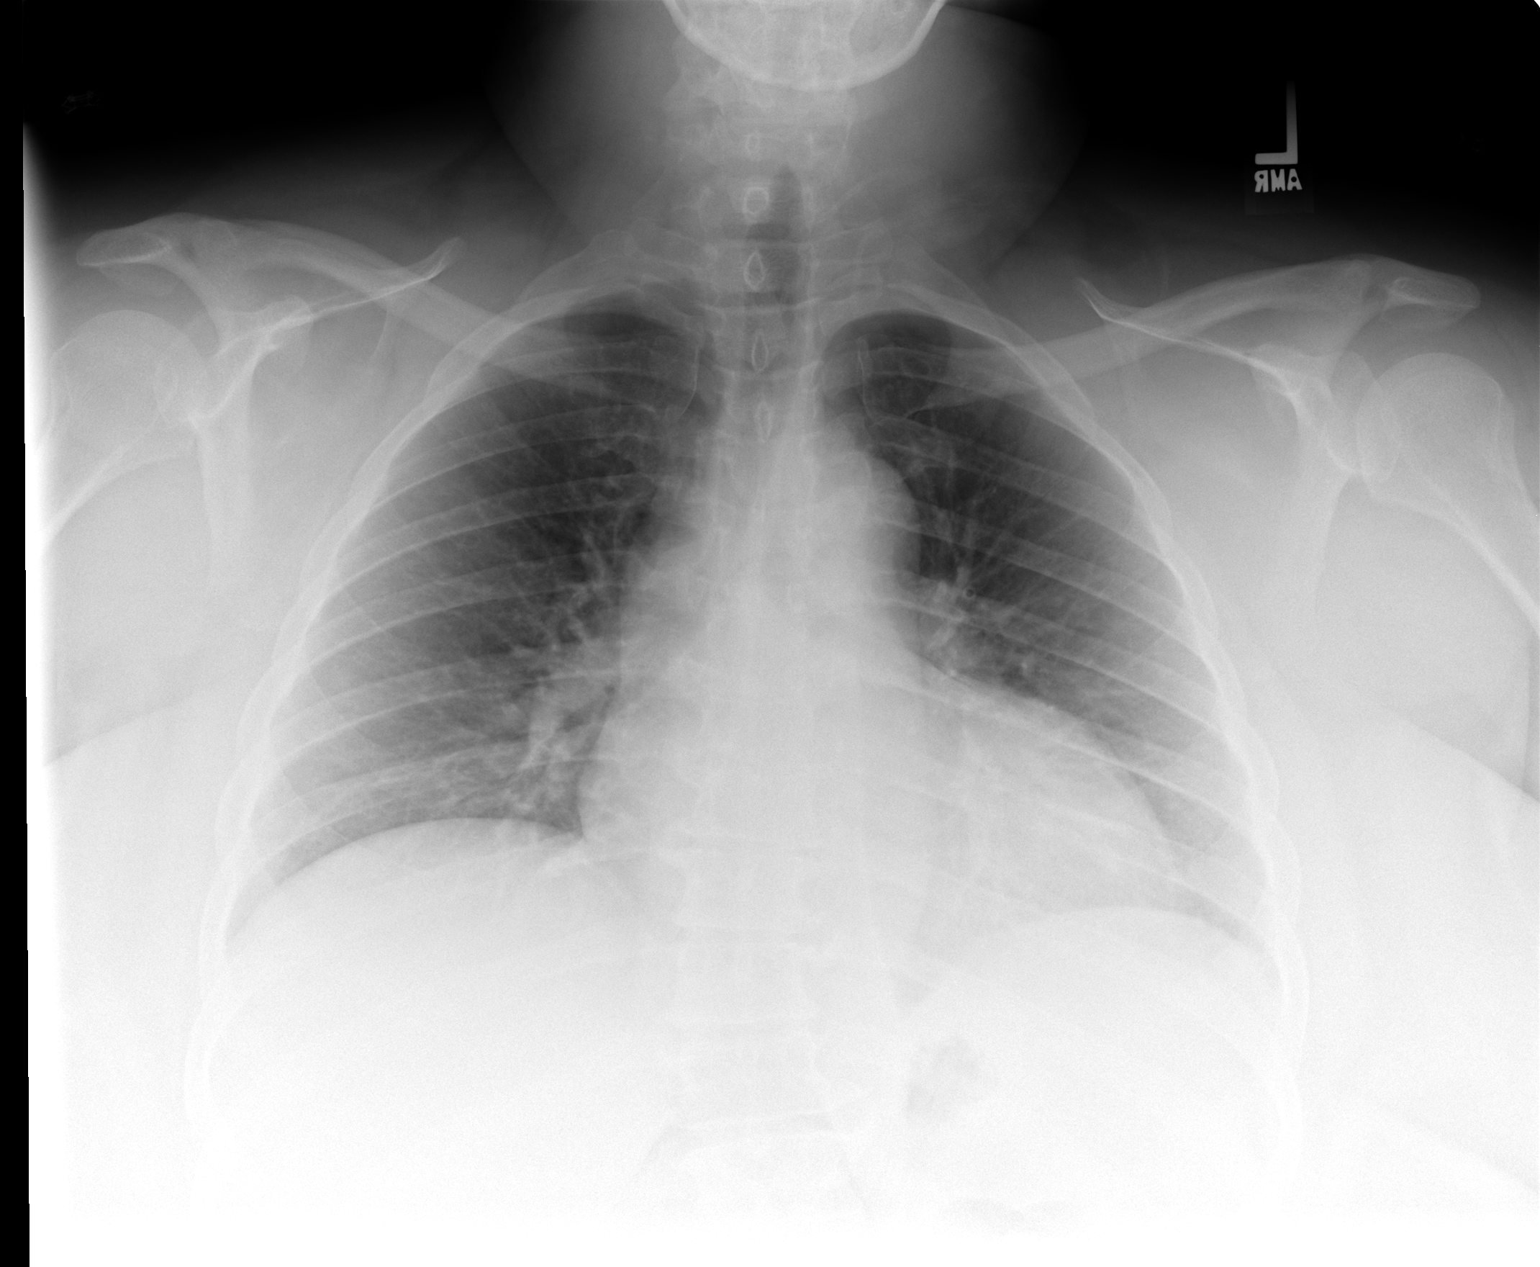

[view not recorded (2 of 2)]
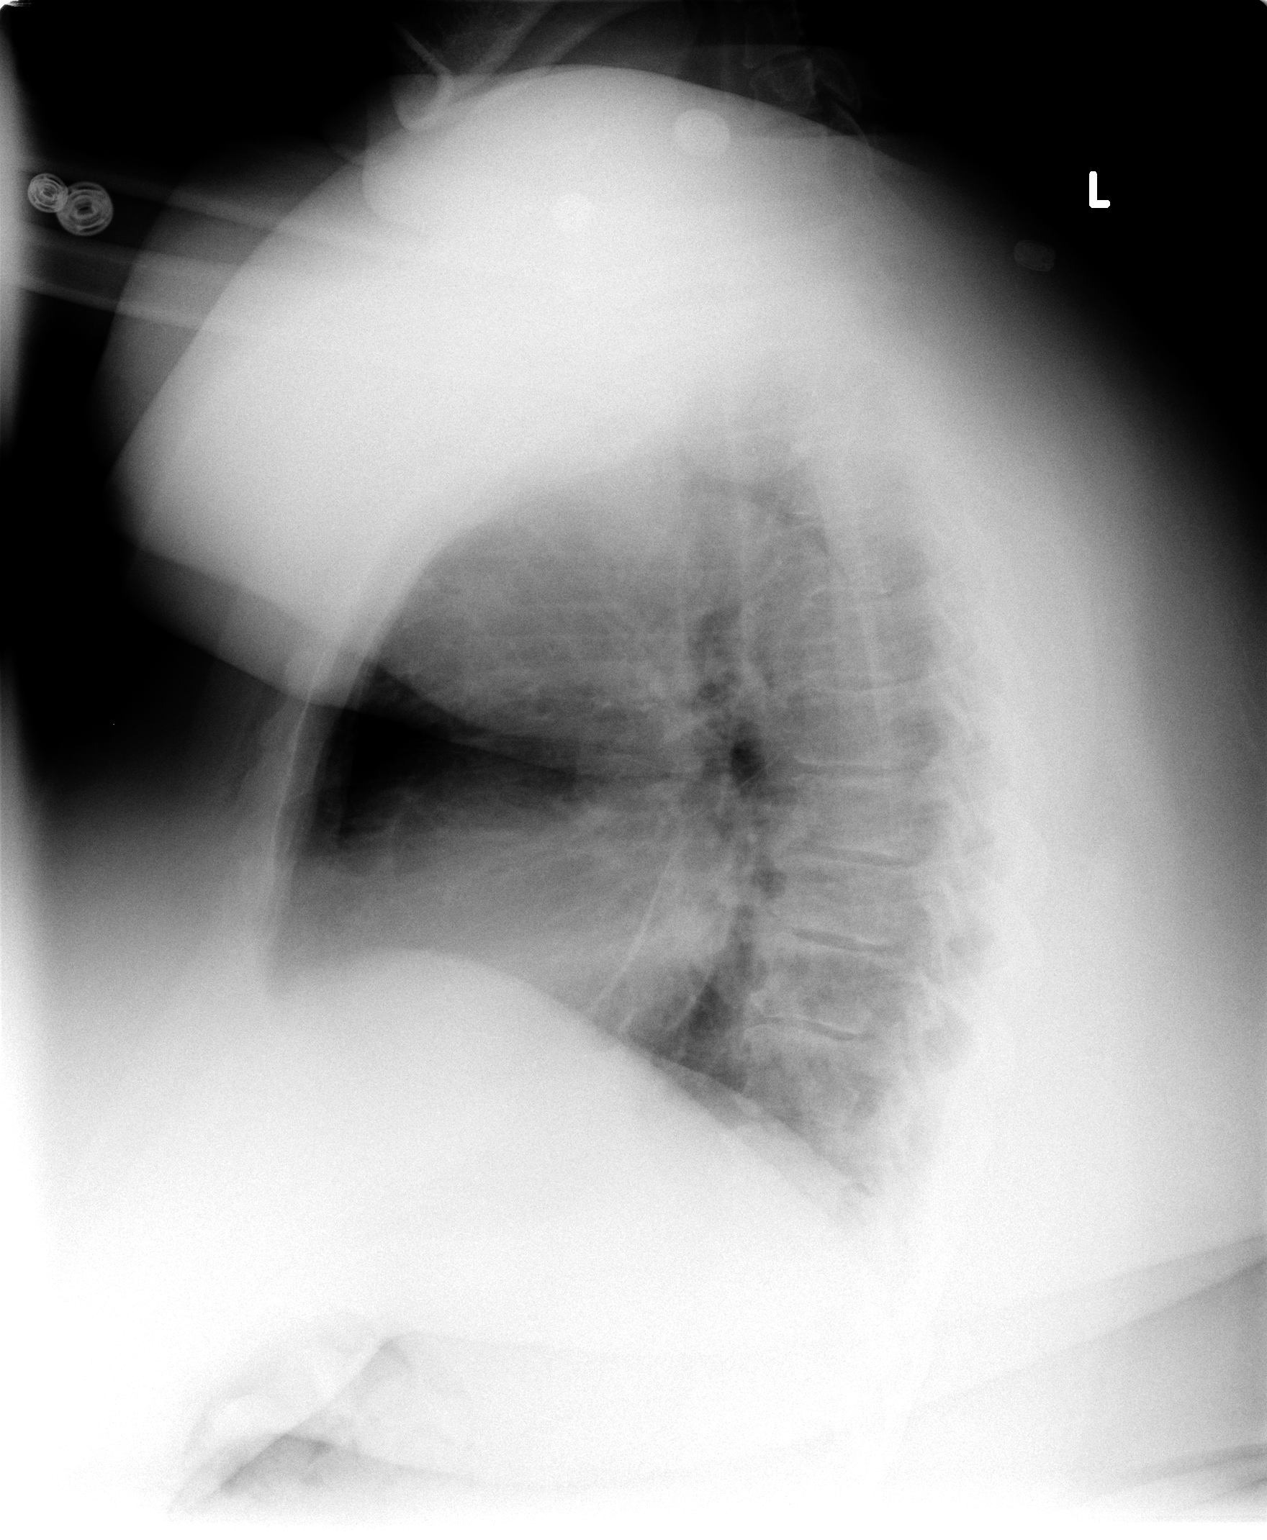

[2 of 2 positions shown; findings below may reference images not displayed]

FINDINGS: Heart size within normal limits considering relative low
level of inspiration.  No congestive heart failure active disease.
Osseous structures intact with some degenerative spurring in the T-
spine.
IMPRESSION: Suboptimal inspiration - no active disease.

## 2009-11-11 ENCOUNTER — Emergency Department (HOSPITAL_COMMUNITY): Admission: EM | Admit: 2009-11-11 | Discharge: 2009-11-11 | Payer: Self-pay | Admitting: Emergency Medicine

## 2009-11-22 ENCOUNTER — Telehealth (INDEPENDENT_AMBULATORY_CARE_PROVIDER_SITE_OTHER): Payer: Self-pay

## 2009-11-25 ENCOUNTER — Encounter: Payer: Self-pay | Admitting: Family Medicine

## 2009-12-05 DIAGNOSIS — Z8719 Personal history of other diseases of the digestive system: Secondary | ICD-10-CM | POA: Insufficient documentation

## 2010-01-02 ENCOUNTER — Telehealth: Payer: Self-pay | Admitting: Family Medicine

## 2010-01-03 ENCOUNTER — Telehealth: Payer: Self-pay | Admitting: Family Medicine

## 2010-01-07 ENCOUNTER — Ambulatory Visit: Payer: Self-pay | Admitting: Family Medicine

## 2010-01-07 DIAGNOSIS — J309 Allergic rhinitis, unspecified: Secondary | ICD-10-CM | POA: Insufficient documentation

## 2010-01-08 ENCOUNTER — Encounter: Payer: Self-pay | Admitting: Family Medicine

## 2010-01-08 ENCOUNTER — Telehealth: Payer: Self-pay | Admitting: Family Medicine

## 2010-01-08 LAB — CONVERTED CEMR LAB
ALT: 20 units/L (ref 0–35)
AST: 15 units/L (ref 0–37)
Albumin: 4.2 g/dL (ref 3.5–5.2)
Alkaline Phosphatase: 103 units/L (ref 39–117)
BUN: 15 mg/dL (ref 6–23)
Bilirubin, Direct: 0.1 mg/dL (ref 0.0–0.3)
CO2: 23 meq/L (ref 19–32)
Calcium: 9.3 mg/dL (ref 8.4–10.5)
Chloride: 106 meq/L (ref 96–112)
Cholesterol: 230 mg/dL — ABNORMAL HIGH (ref 0–200)
Creatinine, Ser: 0.87 mg/dL (ref 0.40–1.20)
Direct LDL: 122 mg/dL — ABNORMAL HIGH
Glucose, Bld: 108 mg/dL — ABNORMAL HIGH (ref 70–99)
HDL: 49 mg/dL (ref >39)
Hgb A1c MFr Bld: 6.7 % — ABNORMAL HIGH (ref 4.6–6.1)
Indirect Bilirubin: 0.2 mg/dL (ref 0.0–0.9)
Potassium: 4 meq/L (ref 3.5–5.3)
Sodium: 140 meq/L (ref 135–145)
Total Bilirubin: 0.3 mg/dL (ref 0.3–1.2)
Total CHOL/HDL Ratio: 4.7
Total Protein: 6.9 g/dL (ref 6.0–8.3)
Triglycerides: 460 mg/dL — ABNORMAL HIGH (ref <150)

## 2010-01-09 ENCOUNTER — Encounter: Payer: Self-pay | Admitting: Family Medicine

## 2010-02-04 ENCOUNTER — Telehealth: Payer: Self-pay | Admitting: Family Medicine

## 2010-02-22 ENCOUNTER — Encounter: Payer: Self-pay | Admitting: Orthopedic Surgery

## 2010-02-24 ENCOUNTER — Telehealth: Payer: Self-pay | Admitting: Family Medicine

## 2010-03-11 ENCOUNTER — Telehealth: Payer: Self-pay | Admitting: Family Medicine

## 2010-03-14 ENCOUNTER — Encounter (INDEPENDENT_AMBULATORY_CARE_PROVIDER_SITE_OTHER): Payer: Self-pay | Admitting: *Deleted

## 2010-03-21 ENCOUNTER — Emergency Department (HOSPITAL_COMMUNITY)
Admission: EM | Admit: 2010-03-21 | Discharge: 2010-03-21 | Payer: Self-pay | Source: Home / Self Care | Admitting: Emergency Medicine

## 2010-04-16 ENCOUNTER — Telehealth (INDEPENDENT_AMBULATORY_CARE_PROVIDER_SITE_OTHER): Payer: Self-pay | Admitting: *Deleted

## 2010-04-16 ENCOUNTER — Telehealth: Payer: Self-pay | Admitting: Family Medicine

## 2010-04-17 ENCOUNTER — Ambulatory Visit: Payer: Self-pay | Admitting: Family Medicine

## 2010-04-17 DIAGNOSIS — F411 Generalized anxiety disorder: Secondary | ICD-10-CM | POA: Insufficient documentation

## 2010-04-17 DIAGNOSIS — G4700 Insomnia, unspecified: Secondary | ICD-10-CM | POA: Insufficient documentation

## 2010-04-17 LAB — HM DIABETES FOOT EXAM

## 2010-07-17 ENCOUNTER — Ambulatory Visit: Payer: Self-pay | Admitting: Family Medicine

## 2010-07-18 ENCOUNTER — Telehealth: Payer: Self-pay | Admitting: Family Medicine

## 2010-07-18 LAB — CONVERTED CEMR LAB
ALT: 15 units/L (ref 0–35)
AST: 17 units/L (ref 0–37)
Albumin: 4.1 g/dL (ref 3.5–5.2)
Alkaline Phosphatase: 98 units/L (ref 39–117)
BUN: 11 mg/dL (ref 6–23)
Basophils Absolute: 0.1 10*3/uL (ref 0.0–0.1)
Basophils Relative: 1 % (ref 0–1)
Bilirubin, Direct: 0.1 mg/dL (ref 0.0–0.3)
CO2: 24 meq/L (ref 19–32)
Calcium: 9.5 mg/dL (ref 8.4–10.5)
Chloride: 105 meq/L (ref 96–112)
Cholesterol: 239 mg/dL — ABNORMAL HIGH (ref 0–200)
Creatinine, Ser: 0.79 mg/dL (ref 0.40–1.20)
Eosinophils Absolute: 0.2 10*3/uL (ref 0.0–0.7)
Eosinophils Relative: 3 % (ref 0–5)
Glucose, Bld: 212 mg/dL — ABNORMAL HIGH (ref 70–99)
HCT: 43.6 % (ref 36.0–46.0)
HDL: 45 mg/dL (ref >39)
Hemoglobin: 15 g/dL (ref 12.0–15.0)
Hgb A1c MFr Bld: 10.3 % — ABNORMAL HIGH (ref <5.7)
Indirect Bilirubin: 0.4 mg/dL (ref 0.0–0.9)
LDL Cholesterol: 114 mg/dL — ABNORMAL HIGH (ref 0–99)
Lymphocytes Relative: 36 % (ref 12–46)
Lymphs Abs: 2.6 10*3/uL (ref 0.7–4.0)
MCHC: 34.4 g/dL (ref 30.0–36.0)
MCV: 94.2 fL (ref 78.0–100.0)
Monocytes Absolute: 0.4 10*3/uL (ref 0.1–1.0)
Monocytes Relative: 5 % (ref 3–12)
Neutro Abs: 3.9 10*3/uL (ref 1.7–7.7)
Neutrophils Relative %: 55 % (ref 43–77)
Platelets: 226 10*3/uL (ref 150–400)
Potassium: 3.8 meq/L (ref 3.5–5.3)
RBC: 4.63 M/uL (ref 3.87–5.11)
RDW: 13.8 % (ref 11.5–15.5)
Sodium: 140 meq/L (ref 135–145)
TSH: 0.878 microintl units/mL (ref 0.350–4.500)
Total Bilirubin: 0.5 mg/dL (ref 0.3–1.2)
Total CHOL/HDL Ratio: 5.3
Total Protein: 6.7 g/dL (ref 6.0–8.3)
Triglycerides: 400 mg/dL — ABNORMAL HIGH (ref <150)
VLDL: 80 mg/dL — ABNORMAL HIGH (ref 0–40)
WBC: 7.1 10*3/uL (ref 4.0–10.5)

## 2010-07-22 ENCOUNTER — Telehealth: Payer: Self-pay | Admitting: Family Medicine

## 2010-09-09 ENCOUNTER — Telehealth: Payer: Self-pay | Admitting: Family Medicine

## 2010-10-01 ENCOUNTER — Ambulatory Visit: Payer: Self-pay | Admitting: Family Medicine

## 2010-10-23 ENCOUNTER — Other Ambulatory Visit
Admission: RE | Admit: 2010-10-23 | Discharge: 2010-10-23 | Payer: Self-pay | Source: Home / Self Care | Admitting: Family Medicine

## 2010-10-24 ENCOUNTER — Encounter: Payer: Self-pay | Admitting: Family Medicine

## 2010-10-24 ENCOUNTER — Telehealth: Payer: Self-pay | Admitting: Family Medicine

## 2010-10-28 ENCOUNTER — Telehealth: Payer: Self-pay | Admitting: Family Medicine

## 2010-10-28 ENCOUNTER — Encounter: Payer: Self-pay | Admitting: Family Medicine

## 2010-10-28 LAB — CONVERTED CEMR LAB: Hgb A1c MFr Bld: 10 % — ABNORMAL HIGH (ref <5.7)

## 2010-11-03 ENCOUNTER — Telehealth: Payer: Self-pay | Admitting: Family Medicine

## 2010-11-03 ENCOUNTER — Encounter: Payer: Self-pay | Admitting: Family Medicine

## 2010-11-04 ENCOUNTER — Telehealth: Payer: Self-pay | Admitting: Family Medicine

## 2010-11-10 ENCOUNTER — Ambulatory Visit (HOSPITAL_COMMUNITY): Admission: RE | Admit: 2010-11-10 | Payer: Self-pay | Source: Home / Self Care | Admitting: Family Medicine

## 2010-11-15 ENCOUNTER — Encounter: Payer: Self-pay | Admitting: Family Medicine

## 2010-11-16 ENCOUNTER — Encounter: Payer: Self-pay | Admitting: Internal Medicine

## 2010-11-16 ENCOUNTER — Encounter: Payer: Self-pay | Admitting: Family Medicine

## 2010-11-25 NOTE — Assessment & Plan Note (Signed)
Summary: RT SHOULDER PAIN NO XR/MEDICARE/MEDICAID/[place]/BSF    History of Present Illness: I saw [name] in the office today for a followup visit.  She is a 45 years old woman with the complaint of:  right shoulder pain.  Referral from Dr. [name].  Patient states her shoulder has been hurting for over 6 months. she says that sometimes it hurts to raise her arm or do certain movements. She needs help doing housework. Dr. [name] told her she had bursitis and gaveher a shot in both of her hips 6 months ago, helped some.  Patient will have an xray today.  started 6 mos  where posterior surgery pain with forward elevation yes  pain at night yes  dislocation no  surgery no       Prior Medication List:  LEXAPRO 10 MG  TABS (ESCITALOPRAM OXALATE) 1and half tab by mouth once daily GLIPIZIDE 5 MG  TABS (GLIPIZIDE) one tab by mouth once daily IBUPROFEN 800 MG  TABS (IBUPROFEN) one tab by mouth once daily as needed VERAPAMIL HCL CR 180 MG  TBCR (VERAPAMIL HCL) two tabs by mouth once daily FUROSEMIDE 40 MG  TABS (FUROSEMIDE) one tab by mouth once daily BUDEPRION XL 300 MG  TB24 (BUPROPION HCL) one tab by mouth once daily ALPRAZOLAM 0.5 MG  TABS (ALPRAZOLAM) one tab by mouth three times a day as needed FLUTICASONE PROPIONATE 50 MCG/ACT  SUSP (FLUTICASONE PROPIONATE) two puffs each nostril once daily KLOR-CON M20 20 MEQ  TBCR (POTASSIUM CHLORIDE CRYS CR) one tab by mouth two times a day SIMVASTATIN 40 MG  TABS (SIMVASTATIN) one tab by mouth at bedtime PATANOL 0.1 %  SOLN (OLOPATADINE HCL) UAD ASPIRIN 81 MG  TBEC (ASPIRIN) one tab by mouth once daily LISINOPRIL 20 MG  TABS (LISINOPRIL) one tab by mouth once daily CARISOPRODOL 350 MG  TABS (CARISOPRODOL) take one tab by mouth once daily OMEPRAZOLE 20 MG  CPDR (OMEPRAZOLE) take one cap by mouth once daily PHENTERMINE HCL 37.5 MG  TABS (PHENTERMINE HCL) take one tab by mouth once daily HYDRALAZINE HCL 50 MG  TABS (HYDRALAZINE HCL)  take one tab by mouth three times a day HYDROXYZINE HCL 25 MG  TABS (HYDROXYZINE HCL) take one tab by mouth at bedtime NORVASC 5 MG  TABS (AMLODIPINE BESYLATE) Take 1 tablet by mouth once a day PHENTERMINE HCL 37.5 MG CAPS (PHENTERMINE HCL) Take 1 capsule by mouth once a day CETIRIZINE HCL 10 MG TABS (CETIRIZINE HCL) one tab by mouth once daily   Updated Prior Medication List: she says there are no drug allergies  Current Allergies (reviewed today): ! * MCA  Past Medical History:    Reviewed history from 04/30/2008 and no changes required:                     ALCOHOL USE (ICD-305.00)       DIABETES MELLITUS, TYPE II (ICD-250.00)       OBESITY (ICD-278.00)       HYPERLIPIDEMIA (ICD-272.4)       HYPERTENSION (ICD-401.9)         Past Surgical History:    Reviewed history from 01/31/2008 and no changes required:       D&C (1988)   Family History:    Reviewed history from 04/30/2008 and no changes required:       TWO CHILDREN       MOTHER DECEASED HEART ATTACK       FATHER  LIVING HYPERTENSION, CABG  three living siblings healthy  Social History:    Reviewed history from 04/30/2008 and no changes required:       Disable       Married              Former Smoker       Alcohol use-yes       Drug use-no    Review of Systems       Today there are no complaints in the review of systems. She denies neurologic symptoms.     Physical Exam  Skin:     intact without lesions or rashes Cervical Nodes:     no significant adenopathy Psych:     alert and cooperative; normal mood and affect; normal attention span and concentration   Shoulder/Elbow Exam  General:    She is mildly obese  Skin:    Intact, no scars, lesions, rashes, cafe au lait spots or bruising.    Inspection:    Inspection is normal.    Palpation:    pain is in and around the acromion with tenderness  Vascular:    Radial, ulnar, brachial, and axillary pulses 2+ and symmetric; capillary  refill less than 2 seconds; no evidence of ischemia, clubbing, or cyanosis.    Sensory:    Gross sensation intact in the upper extremities.    Motor:    there is normal motor function to the right shoulder with the exception of the supraspinatus. Internal and external rotators are normal.  Reflexes:    Normal reflexes in the upper extremities.    Shoulder Exam:    Right:    Inspection:  Abnormal    Palpation:  Abnormal    Stability:  stable    Tenderness:  no    Swelling:  no    Erythema:  no    Range of Motion:       Flexion-Active: 90       Extension-Active: 60       Flexion-Passive: 180       Extension-Passive: 60       External Rotation : 45       Interior Rotation : L2    Left:    Inspection:  Normal    Palpation:  Normal    Stability:  stable    Tenderness:  no    Swelling:  no    Erythema:  no    Range of Motion:       Flexion-Active: 180       Extension-Active: 60       Flexion-Passive: 180       Extension-Passive: 60       External Rotation : 60       Interior Rotation : T10  Impingement Sign NEER:    Right positive; Left negative Impingement Sign HAWKINS:    Right positive; Left negative Apprehension Sign:    Right negative; Left negative Sulcus Sign:    Right negative; Left negative OBRIEN'S Test:    Right negative; Left negative Yerguson:    Right negative; Left negative Speeds:    Right negative; Left negative AC joint Adduction Test:    Right negative; Left negative FLEX-IR POST STRESS Test:    Right negative; Left negative     Impression & Recommendations:  Problem # 1:  BURSITIS, RIGHT SHOULDER (ICD-726.10) Assessment: New Data: The data reveals that they are 2 x-rays that were taken the office ordered here. Personal review shows normal shoulder findings.  2 shoulder x-rays right.  Her normal findings in the right shoulder no bony abnormality.  Impression normal right shoulder.  . Procedure injection right shoulder.  Subacromial space injection.  Verbal consent obtained/The shoulder was injected with depomedrol 40mg /cc and sensorcaine .25% . There were no complications   Orders: Est. Patient Level IV ([address])   Problem # 2:  SHOULDER PAIN, RIGHT (ICD-719.41) Assessment: New  Her updated medication list for this problem includes:    Ibuprofen 800 Mg Tabs (Ibuprofen) ..... One tab by mouth once daily as needed    Aspirin 81 Mg Tbec (Aspirin) ..... One tab by mouth once daily    Carisoprodol 350 Mg Tabs (Carisoprodol) .Marland Kitchen... Take one tab by mouth once daily    Ultracet 37.5-325 Mg Tabs (Tramadol-acetaminophen) .Marland Kitchen... 1 by mouth q 4 as needed pain  Orders: Est. Patient Level IV (60[address]) Shoulder x-ray,  minimum 2 views ([address]11) Depo- Medrol 40mg  (J1030) Joint Aspirate / Injection, Large ([address])   Medications Added to Medication List This Visit: 1)  Ultracet 37.5-325 Mg Tabs (Tramadol-acetaminophen) .Marland Kitchen.. 1 by mouth q 4 as needed pain   Patient Instructions: 1)  You have received an injection of cortisone today. You may experience increased pain at the injection site. Apply ice pack to the area for 20 minutes every 2 hours and take 2 xtra strength tylenol every 8 hours. This increased pain will usually resolve in 24 hours. The injection will take effect in 3-10 days.  2)  Limit activity to comfort and avoid activities that increase discomfort.  Apply moist heat and/or ice to shoulder and take medication as instructed for pain relief. Please read the Shoulder Pain Handout and start exercises at home  3)  Please schedule a follow-up appointment as needed.   Prescriptions: ULTRACET 37.5-325 MG TABS (TRAMADOL-ACETAMINOPHEN) 1 by mouth q 4 as needed pain  #40 x 0   Entered by:   Ether Griffins   Authorized by:   Fuller Canada MD   Signed by:   Ether Griffins on 08/21/2008   Method used:   Faxed to ...       [facility]* (retail)       [address]       [place], [place]  [address]       Ph: [phone]       Fax: [phone]   RxID:   [id number] ULTRACET 37.5-325 MG TABS (TRAMADOL-ACETAMINOPHEN) 1 by mouth q 4 as needed pain  #40 x 0   Entered and Authorized by:   Fuller Canada MD   Signed by:   Fuller Canada MD on 08/20/2008   Method used:   Print then Give to Patient   RxID:   [id number]  ]

## 2010-11-25 NOTE — Miscellaneous (Signed)
Summary: refill  Clinical Lists Changes  Medications: Rx of CARISOPRODOL 350 MG  TABS (CARISOPRODOL) take one tab by mouth once daily;  #30 x 4;  Signed;  Entered by: Worthy Keeler LPN;  Authorized by: Syliva Overman MD;  Method used: Electronically to [facility]*, [address], [place], [place], [place]  [address], Ph: [phone], Fax: [phone] Rx of TRAMADOL HCL 50 MG TABS (TRAMADOL HCL) one tab by mouth  twice daily as needed;  #30 Each x 4;  Signed;  Entered by: Worthy Keeler LPN;  Authorized by: Syliva Overman MD;  Method used: Electronically to [facility]*, [address], [place], [place], [place]  [address], Ph: [phone], Fax: [phone]    Prescriptions: TRAMADOL HCL 50 MG TABS (TRAMADOL HCL) one tab by mouth  twice daily as needed  #30 Each x 4   Entered by:   Worthy Keeler LPN   Authorized by:   Syliva Overman MD   Signed by:   Worthy Keeler LPN on 02/72/5366   Method used:   Electronically to        [facility]* (retail)       [address] [place], [place]  [address]       Ph: [phone]       Fax: [phone]   RxID:   [id number] CARISOPRODOL 350 MG  TABS (CARISOPRODOL) take one tab by mouth once daily  #30 x 4   Entered by:   Worthy Keeler LPN   Authorized by:   Syliva Overman MD   Signed by:   Worthy Keeler LPN on 23/55/7322   Method used:   Electronically to        [facility]* (retail)       [address] [place], [place]  [address]       Ph: [phone]       Fax: [phone]   RxID:   [id number]

## 2010-11-25 NOTE — Miscellaneous (Signed)
Summary: refill  Clinical Lists Changes  Medications: Rx of ALPRAZOLAM 0.5 MG  TABS (ALPRAZOLAM) one tab by mouth three times a day as needed;  #90 x 3;  Signed;  Entered by: Worthy Keeler LPN;  Authorized by: Syliva Overman MD;  Method used: Printed then faxed to [facility]*, [address] [place], [place], [place]  [address], Ph: [phone], Fax: [phone]    Prescriptions: ALPRAZOLAM 0.5 MG  TABS (ALPRAZOLAM) one tab by mouth three times a day as needed  #90 x 3   Entered by:   Worthy Keeler LPN   Authorized by:   Syliva Overman MD   Signed by:   Worthy Keeler LPN on 01/60/1093   Method used:   Printed then faxed to ...       [facility]* (retail)       [address]       [place], [place]  [address]       Ph: [phone]       Fax: [phone]   RxID:   [id number]

## 2010-11-25 NOTE — Progress Notes (Signed)
  Phone Note Call from Patient   Summary of Call: Right arm hurting alot worse than it did when she come in on the 22nd.  The pain is so bad especially when she moves it or coughs. It started in her shoulder and now it is in her arm on the inside, the part that touches her breast. She can't lift anything its so bad. Advised to come back in for OV since pain has worsened Initial call taken by: Everitt Amber LPN,  July 22, 2010 3:04 PM

## 2010-11-25 NOTE — Progress Notes (Signed)
Summary: returning your call  Phone Note Call from Patient   Summary of Call: returning your call  [phone] Initial call taken by: Lind Guest,  January 03, 2010 10:14 AM  Follow-up for Phone Call        explained to patient there is nothing we can do about her request for an in home aide being denied Follow-up by: Adella Hare LPN,  January 03, 2010 1:54 PM

## 2010-11-25 NOTE — Miscellaneous (Signed)
Summary: Home Care Report  Home Care Report   Imported By: Lind Guest 12/28/2008 08:10:37  _____________________________________________________________________  External Attachment:    Type:   Image     Comment:   External Document

## 2010-11-25 NOTE — Miscellaneous (Signed)
Summary: refill  Clinical Lists Changes  Medications: Rx of OMEPRAZOLE 20 MG  CPDR (OMEPRAZOLE) take one cap by mouth once daily;  #30 x 5;  Signed;  Entered by: Everitt Amber;  Authorized by: Syliva Overman MD;  Method used: Electronically to [facility]*, [address] [place], [place], [place]  [address], Ph: [phone], Fax: [phone]    Prescriptions: OMEPRAZOLE 20 MG  CPDR (OMEPRAZOLE) take one cap by mouth once daily  #30 x 5   Entered by:   Everitt Amber   Authorized by:   Syliva Overman MD   Signed by:   Everitt Amber on 09/24/2009   Method used:   Electronically to        [facility]* (retail)       [address] [place], [place]  [address]       Ph: [phone]       Fax: [phone]   RxID:   [id number]

## 2010-11-25 NOTE — Progress Notes (Signed)
Summary: refill  Phone Note Call from Patient   Summary of Call: needs to get rx before  the end of day. [facility] done sent fax [phone] Initial call taken by: Rudene Anda,  April 16, 2010 1:45 PM  Follow-up for Phone Call        duplicate message Follow-up by: Adella Hare LPN,  April 16, 2010 2:39 PM

## 2010-11-25 NOTE — Progress Notes (Signed)
Summary: assistance  Phone Note Call from Patient   Summary of Call: wants her assistance back. Wants to know if Doc can do her another order to get her assistance back. needs help. [phone] lost assistance because everything wasn't put in computer. also wants results too.  Initial call taken by: Rudene Anda,  October 30, 2009 1:22 PM  Follow-up for Phone Call        plscall and find out exactly what sassistance she is talkingabout, if cAP she needs to call social services Follow-up by: Syliva Overman MD,  October 30, 2009 5:51 PM  Additional Follow-up for Phone Call Additional follow up Details #1::        advised patient to contact home health agency she is interested in and have them request or send for info or referal needed Additional Follow-up by: Worthy Keeler LPN,  October 31, 2009 9:51 AM

## 2010-11-25 NOTE — Progress Notes (Signed)
Summary: LAB ORDER  Phone Note Call from Patient   Summary of Call:  LAB ORDER FAXED UPSTAIRS GOING TODAY Initial call taken by: Lind Guest,  February 14, 2009 10:34 AM  Follow-up for Phone Call        faxed upstairs Follow-up by: Everitt Amber,  February 14, 2009 10:56 AM

## 2010-11-25 NOTE — Progress Notes (Signed)
Summary: REF  Phone Note Call from Patient   Summary of Call: WANTS TO KNOW ABOUT HER PSY. APPT NOONE HAS CALLED HER AT [phone] Initial call taken by: Lind Guest,  September 05, 2009 1:15 PM  Follow-up for Phone Call        called Rauby and she said that she had to see what dr. Lolly Mustache wanted to do. And she would call pt back with appt Follow-up by: Rudene Anda,  September 09, 2009 10:01 AM

## 2010-11-25 NOTE — Assessment & Plan Note (Signed)
Summary: office visit   Vital Signs:  Patient Profile:   45 Years Old Female Height:     59 inches (149.86 cm) Weight:      219.31 pounds (99.69 kg) BMI:     44.46 BSA:     1.92 Pulse rate:   96 / minute Resp:     16 per minute BP sitting:   110 / 70  Pt. in pain?   no  Vitals Entered By: Everitt Amber (November 07, 2008 2:15 PM)                  Chief Complaint:  follow up chronic problems.  History of Present Illness: Patient reports doing well.  Denies any recent fever or chills.  Denies any appetite change or change in bowel movements. Patient denies depression, anxiety or insomnia. The pt denies polyuria, polydypsia or hypoglycemic episodes. She continues to extremely well with apetite suppression on phentermine, and denies adverse side effects.She does not exercise regularly.    Updated Prior Medication List: IBUPROFEN 800 MG  TABS (IBUPROFEN) one tab by mouth once daily as needed VERAPAMIL HCL CR 180 MG  TBCR (VERAPAMIL HCL) two tabs by mouth once daily FUROSEMIDE 40 MG  TABS (FUROSEMIDE) one tab by mouth once daily ALPRAZOLAM 0.5 MG  TABS (ALPRAZOLAM) one tab by mouth three times a day as needed FLUTICASONE PROPIONATE 50 MCG/ACT  SUSP (FLUTICASONE PROPIONATE) two puffs each nostril once daily KLOR-CON M20 20 MEQ  TBCR (POTASSIUM CHLORIDE CRYS CR) one tab by mouth two times a day SIMVASTATIN 40 MG  TABS (SIMVASTATIN) one tab by mouth at bedtime PATANOL 0.1 %  SOLN (OLOPATADINE HCL) UAD ASPIRIN 81 MG  TBEC (ASPIRIN) one tab by mouth once daily LISINOPRIL 20 MG  TABS (LISINOPRIL) one tab by mouth once daily CARISOPRODOL 350 MG  TABS (CARISOPRODOL) take one tab by mouth once daily OMEPRAZOLE 20 MG  CPDR (OMEPRAZOLE) take one cap by mouth once daily HYDROXYZINE HCL 25 MG  TABS (HYDROXYZINE HCL) take one tab by mouth at bedtime NORVASC 5 MG  TABS (AMLODIPINE BESYLATE) Take 1 tablet by mouth once a day PHENTERMINE HCL 37.5 MG CAPS (PHENTERMINE HCL) Take 1 capsule by  mouth once a day CETIRIZINE HCL 10 MG TABS (CETIRIZINE HCL) one tab by mouth once daily ULTRACET 37.5-325 MG TABS (TRAMADOL-ACETAMINOPHEN) 1 by mouth q 4 as needed pain HYDROCODONE-ACETAMINOPHEN 5-500 MG TABS (HYDROCODONE-ACETAMINOPHEN) Take one tab every 6 hours for pain NAPROXEN 500 MG TABS (NAPROXEN) Take one tab by mouth two times a day SINGULAIR 10 MG TABS (MONTELUKAST SODIUM) Take 1 tablet by mouth once a day ASTELIN 137 MCG/SPRAY SOLN (AZELASTINE HCL) 2 puffs per nostril twice daily IBUPROFEN 800 MG TABS (IBUPROFEN) one tab by mouth once daily prn  Current Allergies: ! * MCA     Review of Systems  General      See HPI  ENT      Denies earache, hoarseness, nasal congestion, nosebleeds, sinus pressure, and sore throat.  CV      Denies chest pain or discomfort, palpitations, shortness of breath with exertion, and swelling of feet.  Resp      Denies cough, sputum productive, and wheezing.  GI      Denies abdominal pain, constipation, diarrhea, nausea, and vomiting.  GU      Denies dysuria and urinary frequency.  MS      Complains of joint pain, low back pain, mid back pain, and stiffness.  Derm  Denies itching, lesion(s), and rash.  Psych      See HPI  Endo      See HPI   Physical Exam  General:     Alert and oriented x3; no cardiopulmonary distress; HEENT; no sinus tenderness, TM clear, EOMI, oropharynx pink and moist. neck supple with no adenopathy; MS: Adequate ROM spine, hips, shoulders and knees; Psych: Good eye contact, normal affect, memory intact, not anxious or depressed appearing; CNS: CN2-12 grossly intact. RESP;: chest clear to ascultation CVS: S1,S2, no murmurs. GI: Abdomen soft and non tender EXTREMITIES: No edema SKIN: Intact, no visible lesions or rashes.     Impression & Recommendations:  Problem # 1:  BACK PAIN (ICD-724.5) Assessment: Unchanged  Her updated medication list for this problem includes:    Ibuprofen 800 Mg Tabs  (Ibuprofen) ..... One tab by mouth once daily as needed    Aspirin 81 Mg Tbec (Aspirin) ..... One tab by mouth once daily    Carisoprodol 350 Mg Tabs (Carisoprodol) .Marland Kitchen... Take one tab by mouth once daily    Ultracet 37.5-325 Mg Tabs (Tramadol-acetaminophen) .Marland Kitchen... 1 by mouth q 4 as needed pain    Hydrocodone-acetaminophen 5-500 Mg Tabs (Hydrocodone-acetaminophen) .Marland Kitchen... Take one tab every 6 hours for pain    Naproxen 500 Mg Tabs (Naproxen) .Marland Kitchen... Take one tab by mouth two times a day    Ibuprofen 800 Mg Tabs (Ibuprofen) ..... One tab by mouth once daily prn   Problem # 2:  DIABETES MELLITUS, TYPE II (ICD-250.00) Assessment: Improved  Her updated medication list for this problem includes:    Aspirin 81 Mg Tbec (Aspirin) ..... One tab by mouth once daily    Lisinopril 20 Mg Tabs (Lisinopril) ..... One tab by mouth once daily  Orders: Hemoglobin A1C (83036) Glucose, (CBG) (14782) T-Urine Microalbumin w/creat. ratio [phone] / [id number])  Labs Reviewed: HgBA1c: 6.0 (11/07/2008)   Creat: 0.93 (07/18/2008)   Microalbumin: <0.20 mg/dL (96/29/5284)   Problem # 3:  OBESITY (ICD-278.00) Assessment: Improved  Problem # 4:  HYPERLIPIDEMIA (ICD-272.4) Assessment: Comment Only  Her updated medication list for this problem includes:    Simvastatin 40 Mg Tabs (Simvastatin) ..... One tab by mouth at bedtime  Orders: T-Lipid Profile [id number])  Labs Reviewed: Chol: 140 (07/18/2008)   HDL: 40 (07/18/2008)   LDL: 75 (07/18/2008)   TG: 127 (07/18/2008) SGOT: 14 (07/18/2008)   SGPT: 14 (07/18/2008)   Problem # 5:  HYPERTENSION (ICD-401.9) Assessment: Improved  Her updated medication list for this problem includes:    Verapamil Hcl Cr 180 Mg Tbcr (Verapamil hcl) .Marland Kitchen..Marland Kitchen Two tabs by mouth once daily    Furosemide 40 Mg Tabs (Furosemide) ..... One tab by mouth once daily    Lisinopril 20 Mg Tabs (Lisinopril) ..... One tab by mouth once daily    Norvasc 5 Mg Tabs (Amlodipine besylate) .Marland Kitchen... Take  1 tablet by mouth once a day  Orders: T-Basic Metabolic Panel [id number])  BP today: 110/70 Prior BP: 114/80 (09/04/2008)  Labs Reviewed: Creat: 0.93 (07/18/2008) Chol: 140 (07/18/2008)   HDL: 40 (07/18/2008)   LDL: 75 (07/18/2008)   TG: 127 (07/18/2008)   Complete Medication List: 1)  Ibuprofen 800 Mg Tabs (Ibuprofen) .... One tab by mouth once daily as needed 2)  Verapamil Hcl Cr 180 Mg Tbcr (Verapamil hcl) .... Two tabs by mouth once daily 3)  Furosemide 40 Mg Tabs (Furosemide) .... One tab by mouth once daily 4)  Alprazolam 0.5 Mg Tabs (Alprazolam) .... One tab by mouth three  times a day as needed 5)  Fluticasone Propionate 50 Mcg/act Susp (Fluticasone propionate) .... Two puffs each nostril once daily 6)  Klor-con M20 20 Meq Tbcr (Potassium chloride crys cr) .... One tab by mouth two times a day 7)  Simvastatin 40 Mg Tabs (Simvastatin) .... One tab by mouth at bedtime 8)  Patanol 0.1 % Soln (Olopatadine hcl) .... Uad 9)  Aspirin 81 Mg Tbec (Aspirin) .... One tab by mouth once daily 10)  Lisinopril 20 Mg Tabs (Lisinopril) .... One tab by mouth once daily 11)  Carisoprodol 350 Mg Tabs (Carisoprodol) .... Take one tab by mouth once daily 12)  Omeprazole 20 Mg Cpdr (Omeprazole) .... Take one cap by mouth once daily 13)  Hydroxyzine Hcl 25 Mg Tabs (Hydroxyzine hcl) .... Take one tab by mouth at bedtime 14)  Norvasc 5 Mg Tabs (Amlodipine besylate) .... Take 1 tablet by mouth once a day 15)  Phentermine Hcl 37.5 Mg Caps (Phentermine hcl) .... Take 1 capsule by mouth once a day 16)  Cetirizine Hcl 10 Mg Tabs (Cetirizine hcl) .... One tab by mouth once daily 17)  Ultracet 37.5-325 Mg Tabs (Tramadol-acetaminophen) .Marland Kitchen.. 1 by mouth q 4 as needed pain 18)  Hydrocodone-acetaminophen 5-500 Mg Tabs (Hydrocodone-acetaminophen) .... Take one tab every 6 hours for pain 19)  Naproxen 500 Mg Tabs (Naproxen) .... Take one tab by mouth two times a day 20)  Singulair 10 Mg Tabs (Montelukast sodium)  .... Take 1 tablet by mouth once a day 21)  Astelin 137 Mcg/spray Soln (Azelastine hcl) .... 2 puffs per nostril twice daily 22)  Ibuprofen 800 Mg Tabs (Ibuprofen) .... One tab by mouth once daily prn  Other Orders: T-CBC No Diff ([id number]) H1N1 vaccine G code (J0093)   Patient Instructions: 1)  CPE  in 3 months. 2)  It is important that you exercise regularly at least 20 minutes 5 times a week. If you develop chest pain, have severe difficulty breathing, or feel very tired , stop exercising immediately and seek medical attention. 3)  You need to lose weight. Consider a lower calorie diet and regular exercise. 4)  CONGRATS on your weight loss, pls keep it up.  5)  BMP prior to visit, ICD-9: 6)  Hepatic Panel prior to visit, ICD-9:  in 3 months. 7)  Lipid Panel prior to visit, ICD-9: 8)  CBC w/ Diff prior to visit, ICD-9: 9)  You will get the H1N1 vaccine today.   Prescriptions: FLUTICASONE PROPIONATE 50 MCG/ACT  SUSP (FLUTICASONE PROPIONATE) two puffs each nostril once daily  #1 x 2   Entered by:   Everitt Amber   Authorized by:   Syliva Overman MD   Signed by:   Everitt Amber on 11/07/2008   Method used:   Printed then faxed to ...       [facility]* (retail)       [address]       [place], [place]  [address]       Ph: [phone]       Fax: [phone]   RxID:   [id number] PHENTERMINE HCL 37.5 MG CAPS (PHENTERMINE HCL) Take 1 capsule by mouth once a day  #42 x 3   Entered by:   Everitt Amber   Authorized by:   Syliva Overman MD   Signed by:   Everitt Amber on 11/07/2008   Method used:   Printed then faxed to .Marland KitchenMarland Kitchen  [facility] (retail)       [address]       [place], [place]  [address]       Ph: [phone]       Fax: [phone]   RxID:   [id number]   Laboratory Results   Blood Tests   Date/Time Received: 11/07/08 2:24pm Date/Time Reported: 11/07/08  2:24pm  Glucose (random): 138 mg/dL   (Normal Range: 84-132) HGBA1C: 6.0%   (Normal Range: Non-Diabetic - 3-6%   Control Diabetic - 6-8%)           H1N1 # 1    Vaccine Type: H1N1 vaccine G code    Site: left deltoid    Mfr: CSL    Dose: 0.5 ml    Route: IM    Given by: Everitt Amber    Exp. Date: 04/24/2009    Lot #: [id number]

## 2010-11-25 NOTE — Progress Notes (Signed)
Summary: cream  Phone Note Call from Patient   Summary of Call: pt would like to get cream samples for diabetic feet.  Initial call taken by: Rudene Anda,  February 15, 2009 8:55 AM  Follow-up for Phone Call        Phone Call Completed Follow-up by: Worthy Keeler LPN,  February 15, 2009 10:01 AM

## 2010-11-25 NOTE — Progress Notes (Signed)
Summary: staying with pt  Phone Note Call from Patient   Summary of Call: wants to know if she can have someone to stay with her sat and [place]. [phone] Initial call taken by: Rudene Anda,  June 17, 2009 11:48 AM  Follow-up for Phone Call        advisept she needs to have an asesment by her case worker and [facility] to determine the need, i do not think she will qualify on medical grounds Follow-up by: Syliva Overman MD,  June 17, 2009 11:57 AM  Additional Follow-up for Phone Call Additional follow up Details #1::        returned call, no answer Additional Follow-up by: Worthy Keeler LPN,  June 17, 2009 11:59 AM    Additional Follow-up for Phone Call Additional follow up Details #2::    patient aware Follow-up by: Lind Guest,  June 17, 2009 12:59 PM   Appended Document: staying with pt patient aware

## 2010-11-25 NOTE — Assessment & Plan Note (Signed)
Summary: office visit   Vital Signs:  Patient Profile:   45 Years Old Female Height:     61 inches Weight:      243.0 pounds Pulse rate:   60 / minute Pulse rhythm:   regular BP sitting:   110 / 80  (right arm)  Pt. in pain?   yes    Location:   R shoulder    Intensity:   5    Type:       aching                  Chief Complaint:  shoulder pain and routine f/u.  History of Present Illness: Pt. reports improvement in her shoulder pain since the last visit with improved mobility. We discussed the use of specialised equipment for arthritic pain and we both agree that it is not indicated. The pt. denies fever or chills. She continues to do very well with weight loss using phentermine to assist her.     Current Allergies: ! * MCA     Review of Systems  ENT      Denies earache, hoarseness, nasal congestion, sinus pressure, and sore throat.  CV      Denies chest pain or discomfort, palpitations, shortness of breath with exertion, and swelling of feet.  Resp      Denies cough, shortness of breath, sputum productive, and wheezing.  GI      Denies abdominal pain, constipation, nausea, and vomiting.  GU      Denies dysuria and urinary frequency.  Endo      Denies excessive thirst and polyuria.   Physical Exam  General:     overweight-appearing.   Head:     Normocephalic and atraumatic without obvious abnormalities. No apparent alopecia or balding. Eyes:     vision grossly intact.   Ears:     R ear normal and L ear normal.   Nose:     no external erythema and no nasal discharge.   Mouth:     poor dentition.   Neck:     No deformities, masses, or tenderness noted. Lungs:     Normal respiratory effort, chest expands symmetrically. Lungs are clear to auscultation, no crackles or wheezes. Heart:     Normal rate and regular rhythm. S1 and S2 normal without gallop, murmur, click, rub or other extra sounds. Abdomen:     soft and non-tender.   Extremities:     No clubbing, cyanosis, edema, or deformity noted with normal full range of motion of all joints.   Skin:     Intact without suspicious lesions or rashes Cervical Nodes:     No lymphadenopathy noted Psych:     Cognition and judgment appear intact. Alert and cooperative with normal attention span and concentration. No apparent delusions, illusions, hallucinations  Diabetes Management Exam:    Foot Exam (with socks and/or shoes not present):       Sensory-Pinprick/Light touch:          Left medial foot (L-4): normal          Left dorsal foot (L-5): normal          Left lateral foot (S-1): normal          Right medial foot (L-4): normal          Right dorsal foot (L-5): normal          Right lateral foot (S-1): normal       Inspection:  Left foot: normal          Right foot: normal    Eye Exam:       Eye Exam done elsewhere    Impression & Recommendations:  Problem # 1:  SHOULDER PAIN, RIGHT (ICD-719.41) Assessment: Improved  Her updated medication list for this problem includes:    Ibuprofen 800 Mg Tabs (Ibuprofen) ..... One tab by mouth once daily as needed    Aspirin 81 Mg Tbec (Aspirin) ..... One tab by mouth once daily    Carisoprodol 350 Mg Tabs (Carisoprodol) .Marland Kitchen... Take one tab by mouth once daily  Orders: Orthopedic Referral (Ortho)   Problem # 2:  DIABETES MELLITUS, TYPE II (ICD-250.00) Assessment: Improved  Her updated medication list for this problem includes:    Glipizide 5 Mg Tabs (Glipizide) ..... One tab by mouth once daily    Aspirin 81 Mg Tbec (Aspirin) ..... One tab by mouth once daily    Lisinopril 20 Mg Tabs (Lisinopril) ..... One tab by mouth once daily  Orders: Glucose, (CBG) (82962) Hemoglobin A1C (83036)  Labs Reviewed: HgBA1c: 6.6 (07/24/2008)   Creat: 0.93 (07/18/2008)   Microalbumin: <0.20 mg/dL (78/29/5621)   Problem # 3:  HYPERTENSION (ICD-401.9) Assessment: Deteriorated  Her updated medication list for this problem  includes:    Verapamil Hcl Cr 180 Mg Tbcr (Verapamil hcl) .Marland Kitchen..Marland Kitchen Two tabs by mouth once daily    Furosemide 40 Mg Tabs (Furosemide) ..... One tab by mouth once daily    Lisinopril 20 Mg Tabs (Lisinopril) ..... One tab by mouth once daily    Hydralazine Hcl 50 Mg Tabs (Hydralazine hcl) .Marland Kitchen... Take one tab by mouth three times a day    Norvasc 5 Mg Tabs (Amlodipine besylate) .Marland Kitchen... Take 1 tablet by mouth once a day  BP today: 110/80 Prior BP: 140/100 (06/05/2008)  Labs Reviewed: Creat: 0.93 (07/18/2008) Chol: 140 (07/18/2008)   HDL: 40 (07/18/2008)   LDL: 75 (07/18/2008)   TG: 127 (07/18/2008)   Problem # 4:  OBESITY (ICD-278.00) Assessment: Improved  Problem # 5:  Preventive Health Care (ICD-V70.0) Assessment: Comment Only flu vac today  Complete Medication List: 1)  Lexapro 10 Mg Tabs (Escitalopram oxalate) .Marland Kitchen.. 1and half tab by mouth once daily 2)  Glipizide 5 Mg Tabs (Glipizide) .... One tab by mouth once daily 3)  Ibuprofen 800 Mg Tabs (Ibuprofen) .... One tab by mouth once daily as needed 4)  Verapamil Hcl Cr 180 Mg Tbcr (Verapamil hcl) .... Two tabs by mouth once daily 5)  Furosemide 40 Mg Tabs (Furosemide) .... One tab by mouth once daily 6)  Budeprion Xl 300 Mg Tb24 (Bupropion hcl) .... One tab by mouth once daily 7)  Alprazolam 0.5 Mg Tabs (Alprazolam) .... One tab by mouth three times a day as needed 8)  Fluticasone Propionate 50 Mcg/act Susp (Fluticasone propionate) .... Two puffs each nostril once daily 9)  Klor-con M20 20 Meq Tbcr (Potassium chloride crys cr) .... One tab by mouth two times a day 10)  Simvastatin 40 Mg Tabs (Simvastatin) .... One tab by mouth at bedtime 11)  Patanol 0.1 % Soln (Olopatadine hcl) .... Uad 12)  Aspirin 81 Mg Tbec (Aspirin) .... One tab by mouth once daily 13)  Lisinopril 20 Mg Tabs (Lisinopril) .... One tab by mouth once daily 14)  Carisoprodol 350 Mg Tabs (Carisoprodol) .... Take one tab by mouth once daily 15)  Omeprazole 20 Mg Cpdr  (Omeprazole) .... Take one cap by mouth once daily 16)  Phentermine Hcl 37.5 Mg Tabs (Phentermine hcl) .... Take one tab by mouth once daily 17)  Hydralazine Hcl 50 Mg Tabs (Hydralazine hcl) .... Take one tab by mouth three times a day 18)  Hydroxyzine Hcl 25 Mg Tabs (Hydroxyzine hcl) .... Take one tab by mouth at bedtime 19)  Norvasc 5 Mg Tabs (Amlodipine besylate) .... Take 1 tablet by mouth once a day 20)  Phentermine Hcl 37.5 Mg Caps (Phentermine hcl) .... Take 1 capsule by mouth once a day  Other Orders: Influenza Vaccine MCR ([address])   Patient Instructions: 1)  You are doing great , keep it up. 2)  Flu shot today, and you are being referred to Dr. Romeo Apple for your shoulder. 3)  F/U 6 weeks. 4)  It is important that you exercise regularly at least 20 minutes 5 times a week. If you develop chest pain, have severe difficulty breathing, or feel very tired , stop exercising immediately and seek medical attention. 5)  You need to lose weight. Consider a lower calorie diet and regular exercise.  6)  Check your Blood Pressure regularly. If it is above150/95  you should make an appointment. 7)  Check your blood sugars regularly. If your readings are usually above250: or below 70 you should contact our office.   Prescriptions: ALPRAZOLAM 0.5 MG  TABS (ALPRAZOLAM) one tab by mouth three times a day as needed  #90 x 3   Entered by:   Worthy Keeler LPN   Authorized by:   Syliva Overman MD   Signed by:   Worthy Keeler LPN on 60/45/4098   Method used:   Printed then faxed to ...       [facility] (retail)       [address]       [place], [place]  [address]       Ph: [phone]       Fax: [phone]   RxID:   [id number] HYDRALAZINE HCL 50 MG  TABS (HYDRALAZINE HCL) take one tab by mouth three times a day  #90 x 3   Entered by:   Worthy Keeler LPN   Authorized by:   Syliva Overman MD   Signed by:   Worthy Keeler LPN on 53/66/4403   Method  used:   Electronically to        [facility] (retail)       [address]       [place], [place]  [address]       Ph: [phone]       Fax: [phone]   RxID:   [id number] PATANOL 0.1 %  SOLN (OLOPATADINE HCL) UAD  #1 x 3   Entered by:   Worthy Keeler LPN   Authorized by:   Syliva Overman MD   Signed by:   Worthy Keeler LPN on 73/22/0254   Method used:   Electronically to        [facility] (retail)       [address] [place], [place]  [address]       Ph: [phone]       Fax: [phone]   RxID:   [id number] KLOR-CON M20 20 MEQ  TBCR (POTASSIUM CHLORIDE CRYS CR) one tab by mouth two times a day  #60 x 3   Entered by:  Worthy Keeler LPN   Authorized by:   Syliva Overman MD   Signed by:   Worthy Keeler LPN on 63/10/6008   Method used:   Electronically to        [facility] (retail)       [address]       [place], [place]  [address]       Ph: [phone]       Fax: [phone]   RxID:   [id number] FLUTICASONE PROPIONATE 50 MCG/ACT  SUSP (FLUTICASONE PROPIONATE) two puffs each nostril once daily  #1 x 3   Entered by:   Worthy Keeler LPN   Authorized by:   Syliva Overman MD   Signed by:   Worthy Keeler LPN on 94/85/4627   Method used:   Electronically to        [facility] (retail)       [address]       [place], [place]  [address]       Ph: [phone]       Fax: [phone]   RxID:   [id number] VERAPAMIL HCL CR 180 MG  TBCR (VERAPAMIL HCL) two tabs by mouth once daily  #60 x 3   Entered by:   Worthy Keeler LPN   Authorized by:   Syliva Overman MD   Signed by:   Worthy Keeler LPN on 23/53/6144   Method used:   Electronically to        [facility] (retail)       [address] [place], [place]  [address]       Ph:  [phone]       Fax: [phone]   RxID:   [id number] PHENTERMINE HCL 37.5 MG CAPS (PHENTERMINE HCL) Take 1 capsule by mouth once a day  #42 x 0   Entered and Authorized by:   Syliva Overman MD   Signed by:   Syliva Overman MD on 07/24/2008   Method used:   Printed then faxed to ...       [facility] (retail)       [address]       [place], [place]  [address]       Ph: [phone]       Fax: [phone]   RxID:   [id number]  ] Laboratory Results   Blood Tests   Date/Time Received: July 24, 2008 4:00p Date/Time Reported: July 24, 2008 4:00p  Glucose (random): 130 mg/dL   (Normal Range: 17-408) HGBA1C: 6.6%   (Normal Range: Non-Diabetic - 3-6%   Control Diabetic - 6-8%)      Influenza Vaccine    Vaccine Type: Fluvax MCR    Site: right deltoid    Mfr: [employer]    Dose: 0.5 ml    Route: IM    Given by: Worthy Keeler LPN    Exp. Date: 04/24/2009    Lot #: [id number]    VIS given: 05/19/07 version given July 24, 2008.

## 2010-11-25 NOTE — Progress Notes (Signed)
Summary: MED  Phone Note Call from Patient   Summary of Call: NEEDS HER CARISPRODOL  350 MG NEEDS SENT IN WHILE  IN TOWN WITH A RIDE  AND WILL BRING HER MEDICINE ALSO NEEDS MEDS READY TO PICK UP FIRST Initial call taken by: Lind Guest,  April 15, 2009 10:43 AM  Follow-up for Phone Call        pls refill med requested Follow-up by: Syliva Overman MD,  April 15, 2009 12:32 PM  Additional Follow-up for Phone Call Additional follow up Details #1::        Rx Called In Additional Follow-up by: Worthy Keeler LPN,  April 15, 2009 1:32 PM    New/Updated Medications: CARISOPRODOL 350 MG  TABS (CARISOPRODOL) take one tab by mouth once daily SINGULAIR 10 MG TABS (MONTELUKAST SODIUM) Take 1 tablet by mouth once a day [BMN]   Prescriptions: SINGULAIR 10 MG TABS (MONTELUKAST SODIUM) Take 1 tablet by mouth once a day Brand medically necessary #30 x 5   Entered by:   Worthy Keeler LPN   Authorized by:   Syliva Overman MD   Signed by:   Worthy Keeler LPN on 16/07/9603   Method used:   Electronically to        [facility]* (retail)       [address] [place], [place]  [address]       Ph: [phone]       Fax: [phone]   RxID:   [id number] CARISOPRODOL 350 MG  TABS (CARISOPRODOL) take one tab by mouth once daily  #30 x 5   Entered by:   Worthy Keeler LPN   Authorized by:   Syliva Overman MD   Signed by:   Worthy Keeler LPN on 08/22/2535   Method used:   Electronically to        [facility]* (retail)       [address] [place], [place]  [address]       Ph: [phone]       Fax: [phone]   RxID:   [id number]

## 2010-11-25 NOTE — Miscellaneous (Signed)
Summary: Home Care Report  Home Care Report   Imported By: Lind Guest 02/14/2009 16:27:36  _____________________________________________________________________  External Attachment:    Type:   Image     Comment:   External Document

## 2010-11-25 NOTE — Assessment & Plan Note (Signed)
Summary: shot  Nurse Visit              Comments patient complain of increased back pain, injections given per dr [name] Worthy Keeler LPN  October 22, 2008 4:46 PM      Prior Medications: LEXAPRO 10 MG  TABS (ESCITALOPRAM OXALATE) 1and half tab by mouth once daily IBUPROFEN 800 MG  TABS (IBUPROFEN) one tab by mouth once daily as needed VERAPAMIL HCL CR 180 MG  TBCR (VERAPAMIL HCL) two tabs by mouth once daily FUROSEMIDE 40 MG  TABS (FUROSEMIDE) one tab by mouth once daily BUDEPRION XL 300 MG  TB24 (BUPROPION HCL) one tab by mouth once daily ALPRAZOLAM 0.5 MG  TABS (ALPRAZOLAM) one tab by mouth three times a day as needed FLUTICASONE PROPIONATE 50 MCG/ACT  SUSP (FLUTICASONE PROPIONATE) two puffs each nostril once daily KLOR-CON M20 20 MEQ  TBCR (POTASSIUM CHLORIDE CRYS CR) one tab by mouth two times a day SIMVASTATIN 40 MG  TABS (SIMVASTATIN) one tab by mouth at bedtime PATANOL 0.1 %  SOLN (OLOPATADINE HCL) UAD ASPIRIN 81 MG  TBEC (ASPIRIN) one tab by mouth once daily LISINOPRIL 20 MG  TABS (LISINOPRIL) one tab by mouth once daily CARISOPRODOL 350 MG  TABS (CARISOPRODOL) take one tab by mouth once daily OMEPRAZOLE 20 MG  CPDR (OMEPRAZOLE) take one cap by mouth once daily HYDROXYZINE HCL 25 MG  TABS (HYDROXYZINE HCL) take one tab by mouth at bedtime NORVASC 5 MG  TABS (AMLODIPINE BESYLATE) Take 1 tablet by mouth once a day PHENTERMINE HCL 37.5 MG CAPS (PHENTERMINE HCL) Take 1 capsule by mouth once a day CETIRIZINE HCL 10 MG TABS (CETIRIZINE HCL) one tab by mouth once daily ULTRACET 37.5-325 MG TABS (TRAMADOL-ACETAMINOPHEN) 1 by mouth q 4 as needed pain HYDROCODONE-ACETAMINOPHEN 5-500 MG TABS (HYDROCODONE-ACETAMINOPHEN) Take one tab every 6 hours for pain NAPROXEN 500 MG TABS (NAPROXEN) Take one tab by mouth two times a day SINGULAIR 10 MG TABS (MONTELUKAST SODIUM) Take 1 tablet by mouth once a day ASTELIN 137 MCG/SPRAY SOLN (AZELASTINE HCL) 2 puffs per nostril twice  daily ADIPEX-P 37.5 MG TABS (PHENTERMINE HCL) Take 1 tablet by mouth once a day Current Allergies: ! * MCA    Medication Administration  Injection # 1:    Medication: Depo- Medrol 80mg     Diagnosis: BACK PAIN (ICD-724.5)    Route: IM    Site: RUOQ gluteus    Exp Date: 7/12    Lot #: [id number]    Mfr: Pharmacia    Patient tolerated injection without complications    Given by: Worthy Keeler LPN (October 22, 2008 4:44 PM)  Injection # 2:    Medication: Ketorolac-Toradol 15mg     Diagnosis: BACK PAIN (ICD-724.5)    Route: IM    Site: LUOQ gluteus    Exp Date: 3/11    Lot #: [id number]    Mfr: NOVAPLUS    Comments: TORADOL 60MG  GIVEN    Patient tolerated injection without complications    Given by: Worthy Keeler LPN (October 22, 2008 4:45 PM)  Orders Added: 1)  Depo- Medrol 80mg  [J1040] 2)  Ketorolac-Toradol 15mg  [J1885] 3)  Admin of Therapeutic Inj  intramuscular or subcutaneous Lepidus.Putnam    ]

## 2010-11-25 NOTE — Medication Information (Signed)
Summary: Tax adviser   Imported By: Lind Guest 02/19/2009 11:13:33  _____________________________________________________________________  External Attachment:    Type:   Image     Comment:   External Document

## 2010-11-25 NOTE — Miscellaneous (Signed)
Summary: Refill  Clinical Lists Changes  Medications: Rx of ALPRAZOLAM 0.5 MG  TABS (ALPRAZOLAM) one tab by mouth three times a day as needed;  #90 x 3;  Signed;  Entered by: Everitt Amber;  Authorized by: Syliva Overman MD;  Method used: Printed then faxed to [facility]*, [address] [place], [place], [place]  [address], Ph: [phone], Fax: [phone]    Prescriptions: ALPRAZOLAM 0.5 MG  TABS (ALPRAZOLAM) one tab by mouth three times a day as needed  #90 x 3   Entered by:   Everitt Amber   Authorized by:   Syliva Overman MD   Signed by:   Everitt Amber on 03/21/2009   Method used:   Printed then faxed to ...       [facility]* (retail)       [address]       [place], [place]  [address]       Ph: [phone]       Fax: [phone]   RxID:   [id number]

## 2010-11-25 NOTE — Miscellaneous (Signed)
Summary: refill  Clinical Lists Changes  Medications: Rx of PHENTERMINE HCL 37.5 MG CAPS (PHENTERMINE HCL) Take 1 capsule by mouth once a day;  #42 x 0;  Signed;  Entered by: Everitt Amber;  Authorized by: Syliva Overman MD;  Method used: Printed then faxed to [facility]*, [address] [place], [place], [place]  [address], Ph: [phone], Fax: [phone]    Prescriptions: PHENTERMINE HCL 37.5 MG CAPS (PHENTERMINE HCL) Take 1 capsule by mouth once a day  #42 x 0   Entered by:   Everitt Amber   Authorized by:   Syliva Overman MD   Signed by:   Everitt Amber on 05/10/2009   Method used:   Printed then faxed to ...       [facility]* (retail)       [address]       [place], [place]  [address]       Ph: [phone]       Fax: [phone]   RxID:   [id number]

## 2010-11-25 NOTE — Miscellaneous (Signed)
Summary: Home Care Report  Home Care Report   Imported By: Lind Guest 10/10/2008 11:23:09  _____________________________________________________________________  External Attachment:    Type:   Image     Comment:   External Document

## 2010-11-25 NOTE — Progress Notes (Signed)
Summary: medicine  Phone Note Call from Patient   Summary of Call: said the phar. faxed over her rx for xanax and she needs them refilled asap please Initial call taken by: Lind Guest,  April 16, 2010 12:48 PM  Follow-up for Phone Call        please advise needs appt first Follow-up by: Adella Hare LPN,  April 16, 2010 2:39 PM  Additional Follow-up for Phone Call Additional follow up Details #1::        APPT TOMORROW Additional Follow-up by: Lind Guest,  April 16, 2010 2:55 PM

## 2010-11-25 NOTE — Assessment & Plan Note (Signed)
Summary: PHYSICAL/ARC   Vital Signs:  Patient profile:   45 year old female Menstrual status:  regular Height:      59 inches Weight:      214 pounds BMI:     43.38 O2 Sat:      94 % Pulse rate:   104 / minute Pulse rhythm:   irregular Resp:     16 per minute BP sitting:   160 / 108  (left arm) Cuff size:   large  Vitals Entered By: Everitt Amber (May 08, 2009 4:05 PM)  Nutrition Counseling: Patient's BMI is greater than 25 and therefore counseled on weight management options. CC: CPE  Vision Screening:Left eye with correction: 20 / 20 Right eye with correction: 20 / 70 Both eyes with correction: 20 / 20  Color vision testing: normal      Vision Entered By: Everitt Amber (May 08, 2009 4:25 PM)   CC:  CPE.  History of Present Illness: pt states that she is deteriorsating mentally, everything streses her out, she ios not getting alon well with her daughter who is her aid butis not she states really doing anything to help her. 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 She denies recent alcohol use and states she smoked 1 cigarette 1 day ago. She feels like giving up but has no plans.She wants to stay on phentermine and go to therapy. She denies any recent fever or chills. She denies head or chest congestion, dysuria or frequency.  Allergies: 1)  ! * Mca  Review of Systems General:  Complains of fatigue and sleep disorder; denies chills and fever. ENT:  Denies hoarseness, nasal congestion, sinus pressure, and sore throat. CV:  Denies chest pain or discomfort, palpitations, and swelling of feet. Resp:  Denies cough, sputum productive, and wheezing. GI:  Denies abdominal pain, constipation, diarrhea, nausea, and vomiting. GU:  Complains of discharge; denies dysuria and urinary frequency. MS:  Complains of low back pain and mid back pain. Derm:   Denies itching and rash. Psych:  See HPI; Complains of anxiety, depression, easily angered, easily tearful, irritability, and mental problems; denies suicidal thoughts/plans, thoughts of violence, and unusual visions or sounds. Endo:  Denies cold intolerance, excessive hunger, excessive thirst, excessive urination, heat intolerance, polyuria, and weight change. Allergy:  Complains of seasonal allergies.  Physical Exam  General:  alert, well-hydrated, and overweight-appearing.   Head:  Normocephalic and atraumatic without obvious abnormalities. No apparent alopecia or balding. Eyes:  vision grossly intact, pupils equal, pupils round, and pupils reactive to light.   Ears:  External ear exam shows no significant lesions or deformities.  Otoscopic examination reveals clear canals, tympanic membranes are intact bilaterally without bulging, retraction, inflammation or discharge. Hearing is grossly normal bilaterally. Nose:  External nasal examination shows no deformity or inflammation. Nasal mucosa are pink and moist without lesions or exudates. Mouth:  pharynx pink and moist and teeth missing.   Neck:  No deformities, masses, or tenderness noted. Chest Wall:  No deformities, masses, or tenderness noted. Breasts:  No mass, nodules, thickening, tenderness, bulging, retraction, inflamation, nipple discharge or skin changes noted.   Lungs:  Normal respiratory effort, chest expands symmetrically. Lungs are clear to auscultation, no crackles or wheezes. Heart:  Normal rate and regular rhythm. S1 and S2 normal without gallop, murmur, click, rub or other extra sounds. Abdomen:  Bowel  sounds positive,abdomen soft and non-tender without masses, organomegaly or hernias noted. Rectal:  unable, pt to submit cards Genitalia:  normal introitus, no vaginal atrophy, normal uterus size and position, no adnexal masses or tenderness, and vaginal stenosis.   Msk:  No deformity or scoliosis noted of thoracic or lumbar  spine.   Pulses:  R and L carotid,radial,femoral,dorsalis pedis and posterior tibial pulses are full and equal bilaterally Extremities:  decreased ROM thoracolumbar spine. No edema Neurologic:  No cranial nerve deficits noted. Station and gait are normal. Plantar reflexes are down-going bilaterally. DTRs are symmetrical throughout. Sensory, motor and coordinative functions appear intact. Skin:  Intact without suspicious lesions or rashes Cervical Nodes:  No lymphadenopathy noted Axillary Nodes:  No palpable lymphadenopathy Inguinal Nodes:  No significant adenopathy Psych:  Oriented X3, depressed affect, and tearful.    Diabetes Management Exam:    Foot Exam (with socks and/or shoes not present):       Sensory-Monofilament:          Left foot: diminished          Right foot: diminished       Inspection:          Left foot: abnormal             Comments: callus          Right foot: abnormal       Nails:          Left foot: thickened          Right foot: thickened   Impression & Recommendations:  Problem # 1:  DEPRESSION (ICD-311) Assessment Deteriorated  Her updated medication list for this problem includes:    Alprazolam 0.5 Mg Tabs (Alprazolam) ..... One tab by mouth three times a day as needed    Hydroxyzine Hcl 25 Mg Tabs (Hydroxyzine hcl) .Marland Kitchen... Take one tab by mouth at bedtime  Orders: Psychiatric Referral (Psych)  Problem # 2:  HYPERLIPIDEMIA (ICD-272.4) Assessment: Comment Only  Her updated medication list for this problem includes:    Simvastatin 80 Mg Tabs (Simvastatin) .Marland Kitchen... Take 1 tab by mouth at bedtime, needs rept testing  Labs Reviewed: SGOT: 14 (07/18/2008)   SGPT: 14 (07/18/2008)   HDL:57 (02/14/2009), 40 (07/18/2008)  LDL:130 (02/14/2009), 75 (78/46/9629)  Chol:208 (02/14/2009), 140 (07/18/2008)  Trig:103 (02/14/2009), 127 (07/18/2008)  Problem # 3:  HYPERTENSION (ICD-401.9) Assessment: Unchanged  The following medications were removed from the  medication list:    Lisinopril 20 Mg Tabs (Lisinopril) ..... One tab by mouth once daily Her updated medication list for this problem includes:    Verapamil Hcl Cr 180 Mg Tbcr (Verapamil hcl) .Marland Kitchen..Marland Kitchen Two tabs by mouth once daily    Furosemide 40 Mg Tabs (Furosemide) ..... One tab by mouth once daily    Hydralazine Hcl 50 Mg Tabs (Hydralazine hcl) ..... One tab by mouth tid    Norvasc 10 Mg Tabs (Amlodipine besylate) .Marland Kitchen... Take 1 tablet by mouth once a day    Lisinopril 40 Mg Tabs (Lisinopril) .Marland Kitchen... Take 1 tablet by mouth once a day  BP today: 160/108 Prior BP: 170/110 (04/11/2009)  Labs Reviewed: K+: 4.0 (02/14/2009) Creat: : 0.87 (02/14/2009)   Chol: 208 (02/14/2009)   HDL: 57 (02/14/2009)   LDL: 130 (02/14/2009)   TG: 103 (02/14/2009)  Problem # 4:  DIABETES MELLITUS, TYPE II (ICD-250.00) Assessment: Comment Only  The following medications were removed from the medication list:    Lisinopril 20 Mg Tabs (Lisinopril) ..... One tab by  mouth once daily Her updated medication list for this problem includes:    Aspirin 81 Mg Tbec (Aspirin) ..... One tab by mouth once daily    Lisinopril 40 Mg Tabs (Lisinopril) .Marland Kitchen... Take 1 tablet by mouth once a day  Labs Reviewed: Creat: 0.87 (02/14/2009)    Reviewed HgBA1c results: 5.8 (02/18/2009)  6.0 (11/07/2008)  Complete Medication List: 1)  Ibuprofen 800 Mg Tabs (Ibuprofen) .... One tab by mouth once daily as needed 2)  Verapamil Hcl Cr 180 Mg Tbcr (Verapamil hcl) .... Two tabs by mouth once daily 3)  Furosemide 40 Mg Tabs (Furosemide) .... One tab by mouth once daily 4)  Alprazolam 0.5 Mg Tabs (Alprazolam) .... One tab by mouth three times a day as needed 5)  Fluticasone Propionate 50 Mcg/act Susp (Fluticasone propionate) .... Two puffs each nostril once daily 6)  Klor-con M20 20 Meq Tbcr (Potassium chloride crys cr) .... One tab by mouth two times a day 7)  Aspirin 81 Mg Tbec (Aspirin) .... One tab by mouth once daily 8)  Carisoprodol 350 Mg  Tabs (Carisoprodol) .... Take one tab by mouth once daily 9)  Omeprazole 20 Mg Cpdr (Omeprazole) .... Take one cap by mouth once daily 10)  Hydroxyzine Hcl 25 Mg Tabs (Hydroxyzine hcl) .... Take one tab by mouth at bedtime 11)  Phentermine Hcl 37.5 Mg Caps (Phentermine hcl) .... Take 1 capsule by mouth once a day 12)  Singulair 10 Mg Tabs (Montelukast sodium) .... Take 1 tablet by mouth once a day 13)  Astelin 137 Mcg/spray Soln (Azelastine hcl) .... 2 puffs per nostril twice daily 14)  Ibuprofen 800 Mg Tabs (Ibuprofen) .... One tab by mouth once daily prn 15)  Hydralazine Hcl 50 Mg Tabs (Hydralazine hcl) .... One tab by mouth tid 16)  Simvastatin 80 Mg Tabs (Simvastatin) .... Take 1 tab by mouth at bedtime 17)  Phentermine Hcl 37.5 Mg Tabs (Phentermine hcl) .... Take 1 tablet by mouth once a day 18)  Norvasc 10 Mg Tabs (Amlodipine besylate) .... Take 1 tablet by mouth once a day 19)  Tramadol Hcl 50 Mg Tabs (Tramadol hcl) .... One tab by mouth  twice daily as needed 20)  Lisinopril 40 Mg Tabs (Lisinopril) .... Take 1 tablet by mouth once a day  Other Orders: Pap Smear ([id number]) T-Wet Prep by Molecular [facility] [phone]) T-Chlamydia & GC Probe, Genital ([id number])  Patient Instructions: 1)  Please schedule a follow-up appointment in 1 months.nyurse BP check, Md visit 2 months 2)  It is important that you exercise regularly at least 20 minutes 5 times a week. If you develop chest pain, have severe difficulty breathing, or feel very tired , stop exercising immediately and seek medical attention. 3)  You need to lose weight. Consider a lower calorie diet and regular exercise.  4)  you ARE  being referred to mental health fopr counselling, no med changes. 5)  I am increasing the dose of one of your blood pressure meds sinceit is still high Prescriptions: LISINOPRIL 40 MG TABS (LISINOPRIL) Take 1 tablet by mouth once a day  #30 x 3   Entered and Authorized by:   Syliva Overman MD    Signed by:   Syliva Overman MD on 05/08/2009   Method used:   Electronically to        [facility]* (retail)       [address]  [place], [place]  [address]       Ph: [phone]       Fax: [phone]   RxID:   [id number]

## 2010-11-25 NOTE — Miscellaneous (Signed)
Summary: refill  Clinical Lists Changes  Medications: Changed medication from FUROSEMIDE 40 MG  TABS (FUROSEMIDE) one tab by mouth once daily to FUROSEMIDE 40 MG  TABS (FUROSEMIDE) one tab by mouth once daily - Signed Rx of FUROSEMIDE 40 MG  TABS (FUROSEMIDE) one tab by mouth once daily;  #30 x 1;  Signed;  Entered by: Worthy Keeler LPN;  Authorized by: Syliva Overman MD;  Method used: Electronically to [facility]*, [address] [place], [place], [place]  [address], Ph: [phone], Fax: [phone]    Prescriptions: FUROSEMIDE 40 MG  TABS (FUROSEMIDE) one tab by mouth once daily  #30 x 1   Entered by:   Worthy Keeler LPN   Authorized by:   Syliva Overman MD   Signed by:   Worthy Keeler LPN on 37/85/8850   Method used:   Electronically to        [facility]* (retail)       [address]       [place], [place]  [address]       Ph: [phone]       Fax: [phone]   RxID:   [id number]
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
## 2010-11-25 NOTE — Miscellaneous (Signed)
Summary: Refill  Clinical Lists Changes  Medications: Rx of HYDRALAZINE HCL 50 MG  TABS (HYDRALAZINE HCL) take one tab by mouth three times a day;  #90 x 1;  Signed;  Entered by: Everitt Amber;  Authorized by: Syliva Overman MD;  Method used: Electronically to [facility] [address], [place], [place], [place]  [address], Ph: [phone], Fax: [phone]    Prescriptions: HYDRALAZINE HCL 50 MG  TABS (HYDRALAZINE HCL) take one tab by mouth three times a day  #90 x 1   Entered by:   Everitt Amber   Authorized by:   Syliva Overman MD   Signed by:   Everitt Amber on 07/19/2008   Method used:   Electronically to        [facility] (retail)       [address]       [place], [place]  [address]       Ph: [phone]       Fax: [phone]   RxID:   [id number]

## 2010-11-25 NOTE — Medication Information (Signed)
Summary: Tax adviser   Imported By: Lind Guest 05/23/2009 16:18:53  _____________________________________________________________________  External Attachment:    Type:   Image     Comment:   External Document

## 2010-11-25 NOTE — Miscellaneous (Signed)
Summary: Home Care Report  Home Care Report   Imported By: Lind Guest 12/17/2008 15:50:32  _____________________________________________________________________  External Attachment:    Type:   Image     Comment:   External Document
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
## 2010-11-25 NOTE — Progress Notes (Signed)
Summary: call  Phone Note Call from Patient   Summary of Call: going to [facility] now faxed over today Initial call taken by: Lind Guest,  January 01, 2009 2:14 PM  Follow-up for Phone Call        form signed Follow-up by: Syliva Overman MD,  January 02, 2009 8:10 AM

## 2010-11-25 NOTE — Letter (Signed)
Summary: [facility]   Imported By: Lind Guest 01/09/2010 08:44:49  _____________________________________________________________________  External Attachment:    Type:   Image     Comment:   External Document
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
## 2010-11-25 NOTE — Letter (Signed)
Summary: *Orthopedic No Show Letter  [facility]  [address]  [place], [place] [address]   Phone: [phone]  Fax: [phone]     03/14/2010   [name] [address] [place], [place] [address]    Dear Ms. [name],   Our records indicate that you missed your scheduled appointment with Dr. Beaulah Corin on  03/13/10.  Please contact this office to reschedule your appointment as soon as possible.  It is important that you keep your scheduled appointments with your physician, so we can provide you the best care possible.        Sincerely,    Dr. Terrance Mass, MD [facility] Phone [phone]

## 2010-11-25 NOTE — Miscellaneous (Signed)
Summary: update  Clinical Lists Changes  Medications: Rx of HYDROXYZINE HCL 25 MG  TABS (HYDROXYZINE HCL) take one tab by mouth at bedtime;  #30 x 4;  Signed;  Entered by: Worthy Keeler LPN;  Authorized by: Syliva Overman MD;  Method used: Electronically to [facility]*, [address], [place], [place], [place]  [address], Ph: [phone], Fax: [phone] Rx of IBUPROFEN 800 MG TABS (IBUPROFEN) one tab by mouth once daily prn;  #30 x 4;  Signed;  Entered by: Worthy Keeler LPN;  Authorized by: Syliva Overman MD;  Method used: Electronically to [facility]*, [address] [place], [place], [place]  [address], Ph: [phone], Fax: [phone]    Prescriptions: IBUPROFEN 800 MG TABS (IBUPROFEN) one tab by mouth once daily prn  #30 x 4   Entered by:   Worthy Keeler LPN   Authorized by:   Syliva Overman MD   Signed by:   Worthy Keeler LPN on 01/60/1093   Method used:   Electronically to        [facility]* (retail)       [address] [place], [place]  [address]       Ph: [phone]       Fax: [phone]   RxID:   [id number] HYDROXYZINE HCL 25 MG  TABS (HYDROXYZINE HCL) take one tab by mouth at bedtime  #30 x 4   Entered by:   Worthy Keeler LPN   Authorized by:   Syliva Overman MD   Signed by:   Worthy Keeler LPN on 85/46/2703   Method used:   Electronically to        [facility]* (retail)       [address]       [place], [place]  [address]       Ph: [phone]       Fax: [phone]   RxID:   [id number]

## 2010-11-25 NOTE — Progress Notes (Signed)
Summary: BACK IS KILLING HER  Phone Note Call from Patient   Action Taken: Appt Scheduled Today Summary of Call: HER BACK IS KILLING HER  THE IBUPROPEN AND HER TRAMADOL IS NOT WORKING HER BACK AND LEFT LEG AT TOP HURTS SO BAD KILLING HER  ITS CLOUDY PLEASE CALL HER IN SOMETHING AT [place] [facility] CALL HER BACK TO LET HER KNOW Initial call taken by: Lind Guest,  June 06, 2009 2:35 PM  Follow-up for Phone Call        offer toradol 60mg im and depomedrol 80mg im in theoffice, to be followed by a prednisone 5mg  dose pack x 6 days, no refills, she can cometoday for shots, if she doesnot want theshots , she can fill the prednisone 5mg  dosepack I will prescribe and send it in now Follow-up by: Syliva Overman MD,  June 06, 2009 3:30 PM  Additional Follow-up for Phone Call Additional follow up Details #1::        COMING NOW Additional Follow-up by: Lind Guest,  June 06, 2009 3:50 PM    Additional Follow-up for Phone Call Additional follow up Details #2::    Shots given per dr simpson Follow-up by: Everitt Amber,  June 06, 2009 4:33 PM  New/Updated Medications: PREDNISONE (PAK) 5 MG TABS (PREDNISONE) Use as directed Prescriptions: PREDNISONE (PAK) 5 MG TABS (PREDNISONE) Use as directed  #21 x 0   Entered and Authorized by:   Syliva Overman MD   Signed by:   Syliva Overman MD on 06/06/2009   Method used:   Electronically to        [facility]* (retail)       [address]       [place], [place]  [address]       Ph: [phone]       Fax: [phone]   RxID:   [id number]

## 2010-11-25 NOTE — Miscellaneous (Signed)
Summary: refill  Clinical Lists Changes  Medications: Rx of NORVASC 5 MG  TABS (AMLODIPINE BESYLATE) Take 1 tablet by mouth once a day;  #30 x 3;  Signed;  Entered by: Worthy Keeler LPN;  Authorized by: Syliva Overman MD;  Method used: Electronically to [facility]*, [address] [place], [place], [place]  [address], Ph: [phone], Fax: [phone]    Prescriptions: NORVASC 5 MG  TABS (AMLODIPINE BESYLATE) Take 1 tablet by mouth once a day  #30 x 3   Entered by:   Worthy Keeler LPN   Authorized by:   Syliva Overman MD   Signed by:   Worthy Keeler LPN on 13/05/6577   Method used:   Electronically to        [facility]* (retail)       [address]       [place], [place]  [address]       Ph: [phone]       Fax: [phone]   RxID:   [id number]

## 2010-11-25 NOTE — Assessment & Plan Note (Signed)
Summary: office visit ROOM 1   Vital Signs:  Patient profile:   45 year old female Menstrual status:  regular Height:      59 inches Weight:      241 pounds BMI:     48.85 O2 Sat:      95 % Pulse rate:   109 / minute Resp:     16 per minute BP sitting:   118 / 80  (left arm) Cuff size:   large  Vitals Entered By: Everitt Amber, LPN   Nutrition Counseling: Patient's BMI is greater than 25 and therefore counseled on weight management options. CC: Follow up visit, needs xanax and diet pills refilled. Wants increased dose of xanax also    Primary Provider:  Syliva Overman MD  CC:  Follow up visit and needs xanax and diet pills refilled. Wants increased dose of xanax also .  History of Present Illness: Pt is requesting a refill and an increase in her Xanax dose.  She is taking 1 Xanax three times a day . States she recently moved into a new appt.  The appt above her has children who are running, active and bouncing balls on the floor all the time, even late at night.  Pt states she cannot sleep.  She has not complained to mgmt because she is afraid to anger her new neighbors and cause problems.  Pt also states that she has been losing wt well with Phentermine but has not picked up Junes prescription.  She states there is a refill at the pharmacy and that when she has the money she will fill it.  When I advised her she has gained 10 lbs pt states she hasnt been on Phentermine x > 6 mos because of BP problems.  But when I looked at her Rx refils, I asked pt if she had gotten April and Mays rxs that were sent to the pharmacy and she said yes.  Hx of DM.  Taking medications as prescribed. No episodes of hypoglycemia. Checking blood sugar at home.  This am FBS 112. Last eye exam approx 1 yr. Taking ASA daily. Pneumovax UTD.  Hx of htn. Taking medications as prescribed. No headache, chest pain or palpitations.   Allergies: 1)  ! * Mca  Past History:  Past medical history reviewed  for relevance to current acute and chronic problems.  Past Medical History: Reviewed history from 04/30/2008 and no changes required.   ALCOHOL USE (ICD-305.00) DIABETES MELLITUS, TYPE II (ICD-250.00) OBESITY (ICD-278.00) HYPERLIPIDEMIA (ICD-272.4) HYPERTENSION (ICD-401.9)  Review of Systems General:  Complains of fatigue; denies chills and fever. ENT:  Denies earache, nasal congestion, and sore throat. CV:  Denies chest pain or discomfort and palpitations. Resp:  Denies cough and shortness of breath. Psych:  Complains of anxiety; denies depression and thoughts of violence.  Physical Exam  General:  Well-developed,well-nourished,in no acute distress; alert,appropriate and cooperative throughout examination Head:  Normocephalic and atraumatic without obvious abnormalities. No apparent alopecia or balding. Ears:  External ear exam shows no significant lesions or deformities.  Otoscopic examination reveals clear canals, tympanic membranes are intact bilaterally without bulging, retraction, inflammation or discharge. Hearing is grossly normal bilaterally. Nose:  External nasal examination shows no deformity or inflammation. Nasal mucosa are pink and moist without lesions or exudates. Mouth:  Oral mucosa and oropharynx without lesions or exudates.  teeth missing.   Neck:  No deformities, masses, or tenderness noted. Lungs:  Normal respiratory effort, chest expands symmetrically. Lungs are clear to  auscultation, no crackles or wheezes. Heart:  Normal rate and regular rhythm. S1 and S2 normal without gallop, murmur, click, rub or other extra sounds. Pulses:  R posterior tibial normal, R dorsalis pedis normal, L posterior tibial normal, and L dorsalis pedis normal.   Extremities:  No edema Neurologic:  alert & oriented X3, sensation intact to light touch, and gait normal.   Skin:  Intact without suspicious lesions or rashes Cervical Nodes:  No lymphadenopathy noted Psych:  Cognition and  judgment appear intact. Alert and cooperative with normal attention span and concentration. No apparent delusions, illusions, hallucinationsnot anxious appearing and not depressed appearing.    Diabetes Management Exam:    Foot Exam (with socks and/or shoes not present):       Sensory-Monofilament:          Left foot: diminished          Right foot: diminished       Inspection:          Left foot: normal          Right foot: normal       Nails:          Left foot: thickened          Right foot: thickened   Impression & Recommendations:  Problem # 1:  DIABETES MELLITUS, TYPE II (ICD-250.00) Assessment Comment Only Encouraged pt to sched her eye exam.  Her updated medication list for this problem includes:    Aspirin 81 Mg Tbec (Aspirin) ..... One tab by mouth once daily    Lisinopril 40 Mg Tabs (Lisinopril) .Marland Kitchen... Take 1 tablet by mouth once a day    Diovan Hct 320-12.5 Mg Tabs (Valsartan-hydrochlorothiazide) .Marland Kitchen... Take 1 tablet by mouth once a day    Aspirin 325 Mg Tabs (Aspirin) ..... Once daily  Orders: T-CMP with estimated GFR ([address]) T- Hemoglobin A1C (66440-34742) T-Urine Microalbumin w/creat. ratio [phone])  Labs Reviewed: Creat: 0.87 (01/07/2010)    Reviewed HgBA1c results: 6.7 (01/07/2010)  6.4 (08/15/2009)  Problem # 2:  HYPERTENSION (ICD-401.9) Assessment: Improved  Her updated medication list for this problem includes:    Verapamil Hcl Cr 180 Mg Tbcr (Verapamil hcl) .Marland Kitchen..Marland Kitchen Two tabs by mouth once daily    Furosemide 40 Mg Tabs (Furosemide) ..... One tab by mouth once daily    Norvasc 10 Mg Tabs (Amlodipine besylate) .Marland Kitchen... Take 1 tablet by mouth once a day    Lisinopril 40 Mg Tabs (Lisinopril) .Marland Kitchen... Take 1 tablet by mouth once a day    Maxzide 75-50 Mg Tabs (Triamterene-hctz) .Marland Kitchen... Take 1 tablet by mouth once a day    Diovan Hct 320-12.5 Mg Tabs (Valsartan-hydrochlorothiazide) .Marland Kitchen... Take 1 tablet by mouth once a day    Amlodipine Besylate 10 Mg  Tabs (Amlodipine besylate) .Marland Kitchen... Take 1 tablet by mouth once a day  Orders: T-CMP with estimated GFR ([address])  BP today: 118/80 Prior BP: 150/100 (01/07/2010)  Labs Reviewed: K+: 4.0 (01/07/2010) Creat: : 0.87 (01/07/2010)   Chol: 230 (01/07/2010)   HDL: 49 (01/07/2010)   LDL: See Comment mg/dL (63/10/6008)   TG: 932 (01/07/2010)  Problem # 3:  INSOMNIA (ICD-780.52) Assessment: Deteriorated Increased her Hydroxyzine dose at bedtime.  Problem # 4:  ANXIETY (ICD-300.00) Assessment: Unchanged  Her updated medication list for this problem includes:    Alprazolam 0.5 Mg Tabs (Alprazolam) ..... One tab by mouth three times a day as needed    Hydroxyzine Hcl 50 Mg Tabs (Hydroxyzine hcl) .Marland Kitchen... Take 1 at  bedtime    Paroxetine Hcl 20 Mg Tabs (Paroxetine hcl) ..... One tab by mouth once daily  Problem # 5:  OBESITY (ICD-278.00) Assessment: Deteriorated Pt has gained 10 lbs since her visit 3 mos ago.  Complete Medication List: 1)  Verapamil Hcl Cr 180 Mg Tbcr (Verapamil hcl) .... Two tabs by mouth once daily 2)  Furosemide 40 Mg Tabs (Furosemide) .... One tab by mouth once daily 3)  Alprazolam 0.5 Mg Tabs (Alprazolam) .... One tab by mouth three times a day as needed 4)  Fluticasone Propionate 50 Mcg/act Susp (Fluticasone propionate) .... Two puffs each nostril once daily 5)  Klor-con M20 20 Meq Tbcr (Potassium chloride crys cr) .... One tab by mouth two times a day 6)  Aspirin 81 Mg Tbec (Aspirin) .... One tab by mouth once daily 7)  Carisoprodol 350 Mg Tabs (Carisoprodol) .... Take one tab by mouth once daily 8)  Omeprazole 20 Mg Cpdr (Omeprazole) .... Take one cap by mouth once daily 9)  Hydroxyzine Hcl 50 Mg Tabs (Hydroxyzine hcl) .... Take 1 at bedtime 10)  Singulair 10 Mg Tabs (Montelukast sodium) .... Take 1 tablet by mouth once a day 11)  Astelin 137 Mcg/spray Soln (Azelastine hcl) .... 2 puffs per nostril twice daily 12)  Ibuprofen 800 Mg Tabs (Ibuprofen) .... One tab by  mouth once daily prn 13)  Simvastatin 80 Mg Tabs (Simvastatin) .... Take 1 tab by mouth at bedtime 14)  Norvasc 10 Mg Tabs (Amlodipine besylate) .... Take 1 tablet by mouth once a day 15)  Tramadol Hcl 50 Mg Tabs (Tramadol hcl) .... One tab by mouth  twice daily as needed 16)  Lisinopril 40 Mg Tabs (Lisinopril) .... Take 1 tablet by mouth once a day 17)  Maxzide 75-50 Mg Tabs (Triamterene-hctz) .... Take 1 tablet by mouth once a day 18)  Diovan Hct 320-12.5 Mg Tabs (Valsartan-hydrochlorothiazide) .... Take 1 tablet by mouth once a day 19)  Hydrocodone-acetaminophen 5-500 Mg Tabs (Hydrocodone-acetaminophen) .... Take one every 4-6 hours as needed 20)  Aspirin 325 Mg Tabs (Aspirin) .... Once daily 21)  Zyrtec Allergy 10 Mg Tabs (Cetirizine hcl) .... Once daily 22)  Amlodipine Besylate 10 Mg Tabs (Amlodipine besylate) .... Take 1 tablet by mouth once a day 23)  Simvastatin 80 Mg Tabs (Simvastatin) .... Take 1 tab by mouth at bedtime 24)  Phentermine Hcl 37.5 Mg Tabs (Phentermine hcl) .... One tab by mouth once daily 25)  Paroxetine Hcl 20 Mg Tabs (Paroxetine hcl) .... One tab by mouth once daily 26)  Doxycycline Hyclate 100 Mg Caps (Doxycycline hyclate) .... Take 1 two times a day x 7 days  Other Orders: T-Lipid Profile ([id number]) T-CBC No Diff ([id number]) T-TSH ([id number])  Patient Instructions: 1)  Please schedule a follow-up appointment in 3 months. 2)  I have increased your hydroxyzine dose to try to help you to sleep at night. 3)  Bring all of the medicines you take to your next appt please. 4)  I have ordered blood work for you. You need to have these drawn fasting. 5)  It is important that you exercise regularly at least 20 minutes 5 times a week. If you develop chest pain, have severe difficulty breathing, or feel very tired , stop exercising immediately and seek medical attention. 6)  You need to lose weight. Consider a lower calorie diet and regular exercise.   Prescriptions: DOXYCYCLINE HYCLATE 100 MG CAPS (DOXYCYCLINE HYCLATE) take 1 two times a day x 7 days  #14  x 0   Entered and Authorized by:   Esperanza Sheets PA   Signed by:   Esperanza Sheets PA on 04/17/2010   Method used:   Electronically to        [facility]* (retail)       [address]       [place], [place]  [address]       Ph: [phone]       Fax: [phone]   RxID:   [id number] ALPRAZOLAM 0.5 MG  TABS (ALPRAZOLAM) one tab by mouth three times a day as needed  #90 x 2   Entered and Authorized by:   Esperanza Sheets PA   Signed by:   Esperanza Sheets PA on 04/17/2010   Method used:   Printed then faxed to ...       [facility]* (retail)       [address]       [place], [place]  [address]       Ph: [phone]       Fax: [phone]   RxID:   [id number] HYDROXYZINE HCL 50 MG TABS (HYDROXYZINE HCL) take 1 at bedtime  #30 x 0   Entered and Authorized by:   Esperanza Sheets PA   Signed by:   Esperanza Sheets PA on 04/17/2010   Method used:   Electronically to        [facility] (retail)       [address]       [place], [place]  [address]       Ph: [phone]       Fax: [phone]   RxID:   [id number]

## 2010-11-25 NOTE — Progress Notes (Signed)
Summary: rx  Phone Note Call from Patient   Summary of Call: having pain in left legs. needs a rx called into [facility] [phone] Initial call taken by: Rudene Anda,  August 14, 2009 2:13 PM  Follow-up for Phone Call        pls refll ibuprofen x 1 and also let herknow she needs an appt no later than november if she does not have one Follow-up by: Syliva Overman MD,  August 14, 2009 3:34 PM  Additional Follow-up for Phone Call Additional follow up Details #1::        rx sent, called patient, left message Additional Follow-up by: Worthy Keeler LPN,  August 14, 2009 4:59 PM    Additional Follow-up for Phone Call Additional follow up Details #2::    called patient, no answer Follow-up by: Worthy Keeler LPN,  August 15, 2009 11:49 AM  Prescriptions: IBUPROFEN 800 MG  TABS (IBUPROFEN) one tab by mouth once daily as needed  #90 Each x 0   Entered by:   Worthy Keeler LPN   Authorized by:   Syliva Overman MD   Signed by:   Worthy Keeler LPN on 11/91/4782   Method used:   Electronically to        [facility]* (retail)       [address]       [place], [place]  [address]       Ph: [phone]       Fax: [phone]   RxID:   [id number]

## 2010-11-25 NOTE — Progress Notes (Signed)
Summary: CALL  Phone Note Call from Patient   Summary of Call: WANTS TO YOU TO CALL HER AT [phone]  Initial call taken by: Lind Guest,  January 08, 2010 2:21 PM  Follow-up for Phone Call        patient wants you to know she needs an aide due to back pain if another paper is faxed will you sign off on it? Follow-up by: Adella Hare LPN,  January 08, 2010 3:12 PM  Additional Follow-up for Phone Call Additional follow up Details #1::        pls let her know that based on what i know of her back oproblem she does not qualify for an aide so I will not sign for one. If she feels she does qualify and wants a 2nd opinion I will refer her to ortho of her choice to see what they think pls let me know what she decides, I will need to specify the reason for the referral, but I do not believe she has bad enough back probs to need assitance at home, I hav already sent 2 denials to the agency and they do not need to send anymore here unless something changes Additional Follow-up by: Syliva Overman MD,  January 08, 2010 5:55 PM    Additional Follow-up for Phone Call Additional follow up Details #2::    wants ortho referal Follow-up by: Adella Hare LPN,  January 09, 2010 8:42 AM  Additional Follow-up for Phone Call Additional follow up Details #3:: Details for Additional Follow-up Action Taken: faxed over information to dr. Diamantina [facility] office. they will schedule pt appt and time. pt notified  Additional Follow-up by: Rudene Anda,  January 13, 2010 1:53 PM

## 2010-11-25 NOTE — Medication Information (Signed)
Summary: Tax adviser   Imported By: Lind Guest 04/23/2009 15:07:28  _____________________________________________________________________  External Attachment:    Type:   Image     Comment:   External Document

## 2010-11-25 NOTE — Miscellaneous (Signed)
Summary: Home Care Report  Home Care Report   Imported By: Lind Guest 10/23/2008 15:20:19  _____________________________________________________________________  External Attachment:    Type:   Image     Comment:   External Document

## 2010-11-25 NOTE — Progress Notes (Signed)
Summary: [facility]  Phone Note Call from Patient   Summary of Call: calling from [facility] and needs to know if we recieved order on this pt.  debbie [phone] Initial call taken by: Rudene Anda,  July 27, 2008 1:17 PM  Follow-up for Phone Call        arde received and equipment for back pain not needed at this time, pls advise her not to send another order Follow-up by: Syliva Overman MD,  July 29, 2008 7:26 PM  Additional Follow-up for Phone Call Additional follow up Details #1::        Phone Call Completed

## 2010-11-25 NOTE — Assessment & Plan Note (Signed)
Summary: OV   Vital Signs:  Patient profile:   45 year old female Menstrual status:  regular Height:      59 inches Weight:      231 pounds BMI:     46.82 O2 Sat:      96 % Pulse rate:   112 / minute Pulse rhythm:   regular Resp:     16 per minute BP sitting:   150 / 100 Cuff size:   large  Vitals Entered By: Everitt Amber LPN (January 07, 2010 4:07 PM)  Nutrition Counseling: Patient's BMI is greater than 25 and therefore counseled on weight management options. CC: Follow up, has gained alot of weight, wants phentermine back, been stressed out    Primary Care Provider:  Syliva Overman MD  CC:  Follow up, has gained alot of weight, wants phentermine back, and been stressed out .  History of Present Illness: 2 week h/o excessive nasal drainage with cough, the drainage is clear, no fever or chills . She has seasonal allergies , which tend to be worse during the Spring and Fall. She reports that she is still essentially dislocated, has no home to call her own and living with different family members.She denies alcohol or drug use, She test her sugars regularly and report fasting sugars to average less than 120. She reports improvement in her symptoms of depression and anxiety. She unfortunately has regained alot of the weight which she lost, and is motivated to reducing her caloric intake an becoming more active to facilitate weight losss.  Current Medications (verified): 1)  Ibuprofen 800 Mg  Tabs (Ibuprofen) .... One Tab By Mouth Once Daily As Needed 2)  Verapamil Hcl Cr 180 Mg  Tbcr (Verapamil Hcl) .... Two Tabs By Mouth Once Daily 3)  Furosemide 40 Mg  Tabs (Furosemide) .... One Tab By Mouth Once Daily 4)  Alprazolam 0.5 Mg  Tabs (Alprazolam) .... One Tab By Mouth Three Times A Day As Needed 5)  Fluticasone Propionate 50 Mcg/act  Susp (Fluticasone Propionate) .... Two Puffs Each Nostril Once Daily 6)  Klor-Con M20 20 Meq  Tbcr (Potassium Chloride Crys Cr) .... One Tab By Mouth  Two Times A Day 7)  Aspirin 81 Mg  Tbec (Aspirin) .... One Tab By Mouth Once Daily 8)  Carisoprodol 350 Mg  Tabs (Carisoprodol) .... Take One Tab By Mouth Once Daily 9)  Omeprazole 20 Mg  Cpdr (Omeprazole) .... Take One Cap By Mouth Once Daily 10)  Hydroxyzine Hcl 25 Mg  Tabs (Hydroxyzine Hcl) .... Take One Tab By Mouth At Bedtime 11)  Singulair 10 Mg Tabs (Montelukast Sodium) .... Take 1 Tablet By Mouth Once A Day 12)  Astelin 137 Mcg/spray Soln (Azelastine Hcl) .... 2 Puffs Per Nostril Twice Daily 13)  Ibuprofen 800 Mg Tabs (Ibuprofen) .... One Tab By Mouth Once Daily Prn 14)  Simvastatin 80 Mg Tabs (Simvastatin) .... Take 1 Tab By Mouth At Bedtime 15)  Norvasc 10 Mg Tabs (Amlodipine Besylate) .... Take 1 Tablet By Mouth Once A Day 16)  Tramadol Hcl 50 Mg Tabs (Tramadol Hcl) .... One Tab By Mouth  Twice Daily As Needed 17)  Lisinopril 40 Mg Tabs (Lisinopril) .... Take 1 Tablet By Mouth Once A Day 18)  Paxil 20 Mg Tabs (Paroxetine Hcl) .... Take 1 Tablet By Mouth Once A Day 19)  Maxzide 75-50 Mg Tabs (Triamterene-Hctz) .... Take 1 Tablet By Mouth Once A Day 20)  Diovan Hct 320-12.5 Mg Tabs (Valsartan-Hydrochlorothiazide) .... Take  1 Tablet By Mouth Once A Day 21)  Hydrocodone-Acetaminophen 5-500 Mg Tabs (Hydrocodone-Acetaminophen) .... Take One Every 4-6 Hours As Needed 22)  Aspirin 325 Mg Tabs (Aspirin) .... Once Daily 23)  Zyrtec Allergy 10 Mg Tabs (Cetirizine Hcl) .... Once Daily  Allergies (verified): 1)  ! * Mca  Review of Systems      See HPI General:  Complains of fatigue; denies chills and fever. Eyes:  Denies blurring and discharge. ENT:  Complains of nasal congestion and postnasal drainage; denies hoarseness, sinus pressure, and sore throat. CV:  Denies chest pain or discomfort, palpitations, and swelling of feet. Resp:  Complains of cough and sputum productive. GI:  Denies abdominal pain, constipation, diarrhea, nausea, and vomiting. GU:  Denies dysuria and urinary  frequency. MS:  Complains of joint pain, low back pain, mid back pain, and stiffness. Psych:  Complains of anxiety, depression, and mental problems; denies easily tearful, suicidal thoughts/plans, thoughts of violence, and unusual visions or sounds. Endo:  Denies cold intolerance, excessive hunger, excessive thirst, excessive urination, heat intolerance, polyuria, and weight change; tests once daily, fasting under 110. Heme:  Denies abnormal bruising and bleeding. Allergy:  See HPI; Complains of seasonal allergies and sneezing.  Physical Exam  General:  Well-developed,obese,in no acute distress; alert,appropriate and cooperative throughout examination HEENT: No facial asymmetry,  EOMI, No sinus tenderness, TM's Clear, oropharynx  pink and moist. erythema and edema of nasal mucosa.  Chest: Clear to auscultation bilaterally.  CVS: S1, S2, No murmurs, No S3.   Abd: Soft, Nontender.  MS: Adequate ROM spine, hips, shoulders and knees.  Ext: No edema.   CNS: CN 2-12 intact, power tone and sensation normal throughout.   Skin: Intact, no visible lesions or rashes.  Psych: Good eye contact, normal affect.  Memory intact, not anxious or depressed appearing.    Impression & Recommendations:  Problem # 1:  ALLERGIC RHINITIS (ICD-477.9) Assessment Deteriorated  Her updated medication list for this problem includes:    Fluticasone Propionate 50 Mcg/act Susp (Fluticasone propionate) .Marland Kitchen..Marland Kitchen Two puffs each nostril once daily    Astelin 137 Mcg/spray Soln (Azelastine hcl) .Marland Kitchen... 2 puffs per nostril twice daily    Zyrtec Allergy 10 Mg Tabs (Cetirizine hcl) ..... Once daily  Orders: Depo- Medrol 80mg  (J1040) Admin of Therapeutic Inj  intramuscular or subcutaneous (04540)  Problem # 2:  DEPRESSION (ICD-311) Assessment: Improved  Her updated medication list for this problem includes:    Alprazolam 0.5 Mg Tabs (Alprazolam) ..... One tab by mouth three times a day as needed    Hydroxyzine Hcl 25 Mg  Tabs (Hydroxyzine hcl) .Marland Kitchen... Take one tab by mouth at bedtime  Problem # 3:  DIABETES MELLITUS, TYPE II (ICD-250.00) Assessment: Unchanged  Her updated medication list for this problem includes:    Aspirin 81 Mg Tbec (Aspirin) ..... One tab by mouth once daily    Lisinopril 40 Mg Tabs (Lisinopril) .Marland Kitchen... Take 1 tablet by mouth once a day    Diovan Hct 320-12.5 Mg Tabs (Valsartan-hydrochlorothiazide) .Marland Kitchen... Take 1 tablet by mouth once a day    Aspirin 325 Mg Tabs (Aspirin) ..... Once daily  Orders: T- Hemoglobin A1C ([id number])  Labs Reviewed: Creat: 1.02 (09/26/2009)    Reviewed HgBA1c results: 6.4 (08/15/2009)  5.8 (02/18/2009)  Problem # 4:  HYPERTENSION (ICD-401.9) Assessment: Deteriorated  Her updated medication list for this problem includes:    Verapamil Hcl Cr 180 Mg Tbcr (Verapamil hcl) .Marland Kitchen..Marland Kitchen Two tabs by mouth once daily    Furosemide  40 Mg Tabs (Furosemide) ..... One tab by mouth once daily    Norvasc 10 Mg Tabs (Amlodipine besylate) .Marland Kitchen... Take 1 tablet by mouth once a day    Lisinopril 40 Mg Tabs (Lisinopril) .Marland Kitchen... Take 1 tablet by mouth once a day    Maxzide 75-50 Mg Tabs (Triamterene-hctz) .Marland Kitchen... Take 1 tablet by mouth once a day    Diovan Hct 320-12.5 Mg Tabs (Valsartan-hydrochlorothiazide) .Marland Kitchen... Take 1 tablet by mouth once a day    Amlodipine Besylate 10 Mg Tabs (Amlodipine besylate) .Marland Kitchen... Take 1 tablet by mouth once a day  Orders: T-Basic Metabolic Panel [phone])  BP today: 150/100 Prior BP: 140/100 (09/26/2009)  Labs Reviewed: K+: 4.2 (09/26/2009) Creat: : 1.02 (09/26/2009)   Chol: 226 (09/26/2009)   HDL: 53 (09/26/2009)   LDL: 141 (09/26/2009)   TG: 160 (09/26/2009)  Problem # 5:  OBESITY (ICD-278.00) Assessment: Deteriorated  Ht: 59 (01/07/2010)   Wt: 231 (01/07/2010)   BMI: 46.82 (01/07/2010)  Problem # 6:  HYPERLIPIDEMIA (ICD-272.4) Assessment: Comment Only  Her updated medication list for this problem includes:    Simvastatin 80 Mg Tabs  (Simvastatin) .Marland Kitchen... Take 1 tab by mouth at bedtime  Orders: T-Hepatic Function [phone]) T-Lipid Profile [phone])  Labs Reviewed: SGOT: 14 (09/26/2009)   SGPT: 14 (09/26/2009)   HDL:53 (09/26/2009), 57 (02/14/2009)  LDL:141 (09/26/2009), 130 (02/14/2009)  Chol:226 (09/26/2009), 208 (02/14/2009)  Trig:160 (09/26/2009), 103 (02/14/2009)  Complete Medication List: 1)  Ibuprofen 800 Mg Tabs (Ibuprofen) .... One tab by mouth once daily as needed 2)  Verapamil Hcl Cr 180 Mg Tbcr (Verapamil hcl) .... Two tabs by mouth once daily 3)  Furosemide 40 Mg Tabs (Furosemide) .... One tab by mouth once daily 4)  Alprazolam 0.5 Mg Tabs (Alprazolam) .... One tab by mouth three times a day as needed 5)  Fluticasone Propionate 50 Mcg/act Susp (Fluticasone propionate) .... Two puffs each nostril once daily 6)  Klor-con M20 20 Meq Tbcr (Potassium chloride crys cr) .... One tab by mouth two times a day 7)  Aspirin 81 Mg Tbec (Aspirin) .... One tab by mouth once daily 8)  Carisoprodol 350 Mg Tabs (Carisoprodol) .... Take one tab by mouth once daily 9)  Omeprazole 20 Mg Cpdr (Omeprazole) .... Take one cap by mouth once daily 10)  Hydroxyzine Hcl 25 Mg Tabs (Hydroxyzine hcl) .... Take one tab by mouth at bedtime 11)  Singulair 10 Mg Tabs (Montelukast sodium) .... Take 1 tablet by mouth once a day 12)  Astelin 137 Mcg/spray Soln (Azelastine hcl) .... 2 puffs per nostril twice daily 13)  Ibuprofen 800 Mg Tabs (Ibuprofen) .... One tab by mouth once daily prn 14)  Simvastatin 80 Mg Tabs (Simvastatin) .... Take 1 tab by mouth at bedtime 15)  Norvasc 10 Mg Tabs (Amlodipine besylate) .... Take 1 tablet by mouth once a day 16)  Tramadol Hcl 50 Mg Tabs (Tramadol hcl) .... One tab by mouth  twice daily as needed 17)  Lisinopril 40 Mg Tabs (Lisinopril) .... Take 1 tablet by mouth once a day 18)  Paxil 20 Mg Tabs (paroxetine Hcl)  .... Take 1 tablet by mouth once a day 19)  Maxzide 75-50 Mg Tabs (Triamterene-hctz)  .... Take 1 tablet by mouth once a day 20)  Diovan Hct 320-12.5 Mg Tabs (Valsartan-hydrochlorothiazide) .... Take 1 tablet by mouth once a day 21)  Hydrocodone-acetaminophen 5-500 Mg Tabs (Hydrocodone-acetaminophen) .... Take one every 4-6 hours as needed 22)  Aspirin 325 Mg Tabs (Aspirin) .... Once daily  23)  Zyrtec Allergy 10 Mg Tabs (Cetirizine hcl) .... Once daily 24)  Amlodipine Besylate 10 Mg Tabs (Amlodipine besylate) .... Take 1 tablet by mouth once a day 25)  Prednisone (pak) 5 Mg Tabs (Prednisone) .... Use as directed 26)  Simvastatin 80 Mg Tabs (Simvastatin) .... Take 1 tab by mouth at bedtime  Other Orders: Radiology Referral (Radiology)  Patient Instructions: 1)  F/U in 6 weeks. 2)  You will get an injection and tablets for uncontrolled allergies. 3)  Pls ensure you take the additional blood pressure pill , amlodpine one daily allso. 4)  BMP prior to visit, ICD-9: 5)  Hepatic Panel prior to visit, ICD-9:   fasting today 6)  Lipid Panel prior to visit, ICD-9: 7)  HbgA1C prior to visit, ICD-9: 8)  We will sched your mamo Prescriptions: SIMVASTATIN 80 MG TABS (SIMVASTATIN) Take 1 tab by mouth at bedtime  #30 x 4   Entered and Authorized by:   Syliva Overman MD   Signed by:   Syliva Overman MD on 01/08/2010   Method used:   Electronically to        [facility]* (retail)       [address] [place], [place]  [address]       Ph: [phone]       Fax: [phone]   RxID:   [id number] PREDNISONE (PAK) 5 MG TABS (PREDNISONE) Use as directed  #21 x 0   Entered and Authorized by:   Syliva Overman MD   Signed by:   Syliva Overman MD on 01/07/2010   Method used:   Electronically to        [facility]* (retail)       [address]       [place], [place]  [address]       Ph: [phone]       Fax: [phone]   RxID:   [id number] AMLODIPINE BESYLATE 10 MG TABS (AMLODIPINE BESYLATE)  Take 1 tablet by mouth once a day  #30 x 2   Entered and Authorized by:   Syliva Overman MD   Signed by:   Syliva Overman MD on 01/07/2010   Method used:   Electronically to        [facility]* (retail)       [address]       [place], [place]  [address]       Ph: [phone]       Fax: [phone]   RxID:   [id number]    Medication Administration  Injection # 1:    Medication: Depo- Medrol 80mg     Diagnosis: ALLERGIC RHINITIS (ICD-477.9)    Route: IM    Site: LUOQ gluteus    Exp Date: 08/2010    Lot #: [id number]    Mfr: [employer]    Comments: 80mg  given     Patient tolerated injection without complications    Given by: Everitt Amber LPN (January 07, 2010 4:50 PM)  Orders Added: 1)  Est. Patient Level IV [51761] 2)  Radiology Referral [Radiology] 3)  T-Basic Metabolic Panel [80048-22910] 4)  T-Hepatic Function [80076-22960] 5)  T-Lipid Profile [80061-22930] 6)  T- Hemoglobin A1C [83036-23375] 7)  Depo- Medrol 80mg  [J1040] 8)  Admin of Therapeutic Inj  intramuscular or subcutaneous [60737]

## 2010-11-25 NOTE — Progress Notes (Signed)
Summary: pt c/o BRB per rectum intermittent/ ov for 12/06/2009  Phone Note Call from Patient   Caller: Patient Summary of Call: Pt called and scheduled appt with front office for 12/06/2009 with Lorenza Burton, NP. She called back to let me know that she is having bright red blood per rectum intermittent for the last two weeks.thinks she has some more hemorrhoids and wants to know if there is anything she can do til appt. Initial call taken by: Cloria Spring LPN,  November 22, 2009 10:31 AM     Appended Document: pt c/o BRB per rectum intermittent/ ov for 12/06/2009 Please call in Anusol HC 1 pr q12h for 10 days, #qs, rfx1. Take Colace 100 mg two times a day for 7 days to soften stools.  Appended Document: pt c/o BRB per rectum intermittent/ ov for 12/06/2009 Pt informed and Rx called to [facility].

## 2010-11-25 NOTE — Assessment & Plan Note (Signed)
Summary: office visit   Vital Signs:  Patient Profile:   45 Years Old Female Height:     61 inches Weight:      255.4 pounds BMI:     48.43 Pulse rate:   97 / minute Resp:     16 per minute BP sitting:   140 / 100  (right arm)  Pt. in pain?   no  Vitals Entered By: Calvert Cantor (June 05, 2008 2:13 PM)                  Chief Complaint:  pt here for check up.  History of Present Illness: [name] reports excellent apetitie suppression with phentermine and wants to continue the drug. She denies any adverse side effects. The pt. is requesting that i write a note on medical grounds for her to be allowed to have  pet, which I think is appropriate. She denies polyuria, polydypsia hypoglycemis episodes or blurred vision. She tests her sugars on avg. twice weekly, and they are seldom over 120.    Prior Medications Reviewed Using: Medication Bottles  Updated Prior Medication List: LEXAPRO 10 MG  TABS (ESCITALOPRAM OXALATE) 1and half tab by mouth once daily GLIPIZIDE 5 MG  TABS (GLIPIZIDE) one tab by mouth once daily IBUPROFEN 800 MG  TABS (IBUPROFEN) one tab by mouth once daily as needed VERAPAMIL HCL CR 180 MG  TBCR (VERAPAMIL HCL) two tabs by mouth once daily FUROSEMIDE 40 MG  TABS (FUROSEMIDE) one tab by mouth once daily BUDEPRION XL 300 MG  TB24 (BUPROPION HCL) one tab by mouth once daily ALPRAZOLAM 0.5 MG  TABS (ALPRAZOLAM) one tab by mouth three times a day as needed FLUTICASONE PROPIONATE 50 MCG/ACT  SUSP (FLUTICASONE PROPIONATE) two puffs each nostril once daily KLOR-CON M20 20 MEQ  TBCR (POTASSIUM CHLORIDE CRYS CR) one tab by mouth two times a day SIMVASTATIN 40 MG  TABS (SIMVASTATIN) one tab by mouth at bedtime PATANOL 0.1 %  SOLN (OLOPATADINE HCL) UAD ASPIRIN 81 MG  TBEC (ASPIRIN) one tab by mouth once daily LISINOPRIL 20 MG  TABS (LISINOPRIL) one tab by mouth once daily CARISOPRODOL 350 MG  TABS (CARISOPRODOL) take one tab by mouth once daily OMEPRAZOLE 20 MG  CPDR  (OMEPRAZOLE) take one cap by mouth once daily PHENTERMINE HCL 37.5 MG  TABS (PHENTERMINE HCL) take one tab by mouth once daily HYDRALAZINE HCL 50 MG  TABS (HYDRALAZINE HCL) take one tab by mouth three times a day HYDROXYZINE HCL 25 MG  TABS (HYDROXYZINE HCL) take one tab by mouth at bedtime  Current Allergies: ! * MCA     Review of Systems  ENT      Denies hoarseness, sinus pressure, and sore throat.  CV      Denies chest pain or discomfort, palpitations, shortness of breath with exertion, and swelling of feet.  Resp      Denies cough, shortness of breath, and sputum productive.  GI      Denies abdominal pain, constipation, diarrhea, and vomiting.  GU      Denies dysuria and urinary frequency.  Allergy      Denies hives or rash, itching eyes, and sneezing.   Physical Exam  General:     overweight-appearing.   Head:     Normocephalic and atraumatic without obvious abnormalities. No apparent alopecia or balding. Eyes:     vision grossly intact.   Ears:     R ear normal and L ear normal.   Nose:  no external deformity and no nasal discharge.   Mouth:     pharynx pink and moist and teeth missing.   Neck:     No deformities, masses, or tenderness noted. Lungs:     Normal respiratory effort, chest expands symmetrically. Lungs are clear to auscultation, no crackles or wheezes. Heart:     Normal rate and regular rhythm. S1 and S2 normal without gallop, murmur, click, rub or other extra sounds. Abdomen:     soft and normal bowel sounds.   Extremities:     No clubbing, cyanosis, edema, or deformity noted with normal full range of motion of all joints.   Skin:     Intact without suspicious lesions or rashes Psych:     Cognition and judgment appear intact. Alert and cooperative with normal attention span and concentration. No apparent delusions, illusions, hallucinations    Impression & Recommendations:  Problem # 1:  DIABETES MELLITUS, TYPE II  (ICD-250.00) Assessment: Unchanged  Her updated medication list for this problem includes:    Glipizide 5 Mg Tabs (Glipizide) ..... One tab by mouth once daily    Aspirin 81 Mg Tbec (Aspirin) ..... One tab by mouth once daily    Lisinopril 20 Mg Tabs (Lisinopril) ..... One tab by mouth once daily  Orders: T-Urine Microalbumin w/creat. ratio [phone] / [id number])  Labs Reviewed: HgBA1c: 6.2 (01/24/2007)   Creat: 1.10 (12/21/2007)   Microalbumin: <0.20 mg/dL (21/30/8657)   Problem # 2:  OBESITY (ICD-278.00) Assessment: Improved  Problem # 3:  HYPERTENSION (ICD-401.9) Assessment: Deteriorated  Her updated medication list for this problem includes:    Verapamil Hcl Cr 180 Mg Tbcr (Verapamil hcl) .Marland Kitchen..Marland Kitchen Two tabs by mouth once daily    Furosemide 40 Mg Tabs (Furosemide) ..... One tab by mouth once daily    Lisinopril 20 Mg Tabs (Lisinopril) ..... One tab by mouth once daily    Hydralazine Hcl 50 Mg Tabs (Hydralazine hcl) .Marland Kitchen... Take one tab by mouth three times a day    Norvasc 5 Mg Tabs (Amlodipine besylate) .Marland Kitchen... Take 1 tablet by mouth once a day  Orders: T-Basic Metabolic Panel [phone])   Problem # 4:  HYPERLIPIDEMIA (ICD-272.4) Assessment: Comment Only  Her updated medication list for this problem includes:    Simvastatin 40 Mg Tabs (Simvastatin) ..... One tab by mouth at bedtime  Orders: T-Hepatic Function [phone]) T-Lipid Profile [phone])  Labs Reviewed: Chol: 148 (12/21/2007)   HDL: 48 (12/21/2007)   LDL: 65 (12/21/2007)   TG: 173 (12/21/2007) SGOT: 17 (12/21/2007)   SGPT: 28 (12/21/2007)   Complete Medication List: 1)  Lexapro 10 Mg Tabs (Escitalopram oxalate) .Marland Kitchen.. 1and half tab by mouth once daily 2)  Glipizide 5 Mg Tabs (Glipizide) .... One tab by mouth once daily 3)  Ibuprofen 800 Mg Tabs (Ibuprofen) .... One tab by mouth once daily as needed 4)  Verapamil Hcl Cr 180 Mg Tbcr (Verapamil hcl) .... Two tabs by mouth once daily 5)  Furosemide 40 Mg  Tabs (Furosemide) .... One tab by mouth once daily 6)  Budeprion Xl 300 Mg Tb24 (Bupropion hcl) .... One tab by mouth once daily 7)  Alprazolam 0.5 Mg Tabs (Alprazolam) .... One tab by mouth three times a day as needed 8)  Fluticasone Propionate 50 Mcg/act Susp (Fluticasone propionate) .... Two puffs each nostril once daily 9)  Klor-con M20 20 Meq Tbcr (Potassium chloride crys cr) .... One tab by mouth two times a day 10)  Simvastatin 40 Mg Tabs (Simvastatin) .... One  tab by mouth at bedtime 11)  Patanol 0.1 % Soln (Olopatadine hcl) .... Uad 12)  Aspirin 81 Mg Tbec (Aspirin) .... One tab by mouth once daily 13)  Lisinopril 20 Mg Tabs (Lisinopril) .... One tab by mouth once daily 14)  Carisoprodol 350 Mg Tabs (Carisoprodol) .... Take one tab by mouth once daily 15)  Omeprazole 20 Mg Cpdr (Omeprazole) .... Take one cap by mouth once daily 16)  Phentermine Hcl 37.5 Mg Tabs (Phentermine hcl) .... Take one tab by mouth once daily 17)  Hydralazine Hcl 50 Mg Tabs (Hydralazine hcl) .... Take one tab by mouth three times a day 18)  Hydroxyzine Hcl 25 Mg Tabs (Hydroxyzine hcl) .... Take one tab by mouth at bedtime 19)  Norvasc 5 Mg Tabs (Amlodipine besylate) .... Take 1 tablet by mouth once a day   Patient Instructions: 1)  Follow up in 6 weeks. 2)  Check your blood sugars regularly. If your readings are usually above250: or below 70 you should contact our office. 3)  Check your Blood Pressure regularly. If it is above150/95  you should make an appointment. 4)  CONGRATS ON WEIGHT LOSS, KEEP IT UP. 5)  BMP prior to visit, ICD-9: 6)  Hepatic Panel prior to visit, ICD-9: 7)  Lipid Panel prior to visit, ICD-9: 8)  Urine Microalbumin prior to visit, ICD-9: 9)  Call GI about colonscopy.   Prescriptions: LISINOPRIL 20 MG  TABS (LISINOPRIL) one tab by mouth once daily  #30 x 3   Entered by:   Worthy Keeler LPN   Authorized by:   Syliva Overman MD   Signed by:   Worthy Keeler LPN on 16/07/9603    Method used:   Electronically sent to ...       [facility]*       [address]       [place], [place]  [address]       Ph: [phone]       Fax: [phone]   RxID:   [id number] SIMVASTATIN 40 MG  TABS (SIMVASTATIN) one tab by mouth at bedtime  #30 x 3   Entered by:   Worthy Keeler LPN   Authorized by:   Syliva Overman MD   Signed by:   Worthy Keeler LPN on 08/22/2535   Method used:   Electronically sent to ...       [facility]*       [address]       [place], [place]  [address]       Ph: [phone]       Fax: [phone]   RxID:   [id number] CARISOPRODOL 350 MG  TABS (CARISOPRODOL) take one tab by mouth once daily  #30 x 3   Entered by:   Worthy Keeler LPN   Authorized by:   Syliva Overman MD   Signed by:   Worthy Keeler LPN on 32/35/5732   Method used:   Electronically sent to ...       [facility]*       [address], [place]  [address]       Ph: [phone]       Fax: [phone]   RxID:   [id number] OMEPRAZOLE 20 MG  CPDR (OMEPRAZOLE) take one cap by mouth once daily  #30  x 3   Entered by:   Worthy Keeler LPN   Authorized by:   Syliva Overman MD   Signed by:   Worthy Keeler LPN on 95/63/8756   Method used:   Electronically sent to ...       [facility]*       [address]       [place], [place]  [address]       Ph: [phone]       Fax: [phone]   RxID:   [id number] PHENTERMINE HCL 37.5 MG  TABS (PHENTERMINE HCL) take one tab by mouth once daily  #42 x 1   Entered and Authorized by:   Syliva Overman MD   Signed by:   Syliva Overman MD on 06/05/2008   Method used:   Electronically sent to ...       [facility]*       [address]       [place], [place]  [address]       Ph: [phone]       Fax:  [phone]   RxID:   [id number] FLUTICASONE PROPIONATE 50 MCG/ACT  SUSP (FLUTICASONE PROPIONATE) two puffs each nostril once daily  #1 x 4   Entered and Authorized by:   Syliva Overman MD   Signed by:   Syliva Overman MD on 06/05/2008   Method used:   Electronically sent to ...       [facility]*       [address]       [place], [place]  [address]       Ph: [phone]       Fax: [phone]   RxID:   [id number] IBUPROFEN 800 MG  TABS (IBUPROFEN) one tab by mouth once daily as needed  #90 x 4   Entered and Authorized by:   Syliva Overman MD   Signed by:   Syliva Overman MD on 06/05/2008   Method used:   Electronically sent to ...       [facility]*       [address]       [place], [place]  [address]       Ph: [phone]       Fax: [phone]   RxID:   [id number] NORVASC 5 MG  TABS (AMLODIPINE BESYLATE) Take 1 tablet by mouth once a day  #30 x 3   Entered and Authorized by:   Syliva Overman MD   Signed by:   Syliva Overman MD on 06/05/2008   Method used:   Electronically sent to ...       [facility]*       [address], [place]  [address]       Ph: [phone]       Fax: [phone]   RxID:   [id number]  90] Laboratory Results   Blood Tests   Date/Time Received: June 05, 2008 2:25 PM  Date/Time Reported: June 05, 2008 2:25 PM   Glucose (random): 110 mg/dL   (Normal Range: 08-144)

## 2010-11-25 NOTE — Miscellaneous (Signed)
Summary: refill  Clinical Lists Changes  Medications: Rx of IBUPROFEN 800 MG  TABS (IBUPROFEN) one tab by mouth once daily as needed;  #90 Each x 4;  Signed;  Entered by: Everitt Amber;  Authorized by: Syliva Overman MD;  Method used: Electronically to [facility]*, [address] [place], [place], [place]  [address], Ph: [phone], Fax: [phone]    Prescriptions: IBUPROFEN 800 MG  TABS (IBUPROFEN) one tab by mouth once daily as needed  #90 Each x 4   Entered by:   Everitt Amber   Authorized by:   Syliva Overman MD   Signed by:   Everitt Amber on 07/25/2009   Method used:   Electronically to        [facility]* (retail)       [address]       [place], [place]  [address]       Ph: [phone]       Fax: [phone]   RxID:   [id number]

## 2010-11-25 NOTE — Miscellaneous (Signed)
Summary: Home Care Report  Home Care Report   Imported By: Lind Guest 01/02/2009 07:59:14  _____________________________________________________________________  External Attachment:    Type:   Image     Comment:   External Document

## 2010-11-25 NOTE — Letter (Signed)
Summary: Letter  Letter   Imported By: Rudene Anda 10/17/2009 11:13:51  _____________________________________________________________________  External Attachment:    Type:   Image     Comment:   External Document

## 2010-11-25 NOTE — Progress Notes (Signed)
Summary: EYE EXAM  EYE EXAM   Imported By: Lind Guest 11/29/2008 13:51:17  _____________________________________________________________________  External Attachment:    Type:   Image     Comment:   External Document

## 2010-11-25 NOTE — Progress Notes (Signed)
Summary: alprazolam  Phone Note Call from Patient   Summary of Call: patient has an appt. on Dec. 7 would like to know if you could call her Alprazolam 0.5 mg  Initial call taken by: Curtis Sites,  September 09, 2010 10:53 AM  Follow-up for Phone Call        med was sent in yesterday, called patient, no answer Follow-up by: Adella Hare LPN,  September 09, 2010 12:24 PM

## 2010-11-25 NOTE — Medication Information (Signed)
Summary: Tax adviser   Imported By: Lind Guest 02/22/2009 09:24:51  _____________________________________________________________________  External Attachment:    Type:   Image     Comment:   External Document

## 2010-11-25 NOTE — Assessment & Plan Note (Signed)
Summary: OFFICE VISIT   Vital Signs:  Patient Profile:   45 Years Old Female Height:     61 inches Weight:      232 pounds BMI:     43.99 O2 Sat:      98 % Pulse rate:   107 / minute Resp:     16 per minute BP sitting:   114 / 80  (left arm)  Pt. in pain?   no  Vitals Entered By: Everitt Amber (September 04, 2008 3:31 PM)                  Chief Complaint:  Follow up, nasal spray needs to be changed, and not working anymore.  History of Present Illness: Pt is in for review of her weight loss and otherchronic problems. She continues to do exceptionally well with the help of adipex. She has ahd no recent fever or chills. She denies depression , anxiety or insomnia. She does c/o uncontrolled allergy symptoms and is requesting a new nasal spray or alternative.    Updated Prior Medication List: LEXAPRO 10 MG  TABS (ESCITALOPRAM OXALATE) 1and half tab by mouth once daily IBUPROFEN 800 MG  TABS (IBUPROFEN) one tab by mouth once daily as needed VERAPAMIL HCL CR 180 MG  TBCR (VERAPAMIL HCL) two tabs by mouth once daily FUROSEMIDE 40 MG  TABS (FUROSEMIDE) one tab by mouth once daily BUDEPRION XL 300 MG  TB24 (BUPROPION HCL) one tab by mouth once daily ALPRAZOLAM 0.5 MG  TABS (ALPRAZOLAM) one tab by mouth three times a day as needed FLUTICASONE PROPIONATE 50 MCG/ACT  SUSP (FLUTICASONE PROPIONATE) two puffs each nostril once daily KLOR-CON M20 20 MEQ  TBCR (POTASSIUM CHLORIDE CRYS CR) one tab by mouth two times a day SIMVASTATIN 40 MG  TABS (SIMVASTATIN) one tab by mouth at bedtime PATANOL 0.1 %  SOLN (OLOPATADINE HCL) UAD ASPIRIN 81 MG  TBEC (ASPIRIN) one tab by mouth once daily LISINOPRIL 20 MG  TABS (LISINOPRIL) one tab by mouth once daily CARISOPRODOL 350 MG  TABS (CARISOPRODOL) take one tab by mouth once daily OMEPRAZOLE 20 MG  CPDR (OMEPRAZOLE) take one cap by mouth once daily HYDROXYZINE HCL 25 MG  TABS (HYDROXYZINE HCL) take one tab by mouth at bedtime NORVASC 5 MG  TABS  (AMLODIPINE BESYLATE) Take 1 tablet by mouth once a day PHENTERMINE HCL 37.5 MG CAPS (PHENTERMINE HCL) Take 1 capsule by mouth once a day CETIRIZINE HCL 10 MG TABS (CETIRIZINE HCL) one tab by mouth once daily ULTRACET 37.5-325 MG TABS (TRAMADOL-ACETAMINOPHEN) 1 by mouth q 4 as needed pain HYDROCODONE-ACETAMINOPHEN 5-500 MG TABS (HYDROCODONE-ACETAMINOPHEN) Take one tab every 6 hours for pain NAPROXEN 500 MG TABS (NAPROXEN) Take one tab by mouth two times a day  Current Allergies: ! * MCA     Review of Systems  General      Denies chills, fatigue, fever, loss of appetite, malaise, sleep disorder, sweats, weakness, and weight loss.  ENT      Complains of nasal congestion, postnasal drainage, and sinus pressure.      Denies earache.  CV      Denies chest pain or discomfort, palpitations, shortness of breath with exertion, and swelling of feet.  Resp      Complains of cough.      Denies sputum productive and wheezing.  GI      Denies abdominal pain, constipation, diarrhea, nausea, and vomiting.  GU      Denies dysuria and urinary frequency.  MS      Complains of joint pain and stiffness.  Psych      Complains of anxiety and depression.      Denies suicidal thoughts/plans, thoughts of violence, and thoughts /plans of harming others.      controlled  Endo      Denies cold intolerance, excessive hunger, excessive thirst, excessive urination, heat intolerance, polyuria, and weight change.  Allergy      Complains of itching eyes, seasonal allergies, and sneezing.      Denies hives or rash.   Physical Exam  General:     She is morbidly obese Head:     Normocephalic and atraumatic without obvious abnormalities. No apparent alopecia or balding. Eyes:     vision grossly intact.   Ears:     R ear normal and L ear normal.   Nose:     no external erythema and no nasal discharge.   Mouth:     poor dentition.   Neck:     No deformities, masses, or tenderness  noted. Lungs:     Normal respiratory effort, chest expands symmetrically. Lungs are clear to auscultation, no crackles or wheezes. Heart:     Normal rate and regular rhythm. S1 and S2 normal without gallop, murmur, click, rub or other extra sounds. Abdomen:     soft and non-tender.   Extremities:     No clubbing, cyanosis, edema, or deformity noted with normal full range of motion of all joints.   Skin:     intact without lesions or rashes Cervical Nodes:     no significant adenopathy Psych:     alert and cooperative; normal mood and affect; normal attention span and concentration    Impression & Recommendations:  Problem # 1:  DIABETES MELLITUS, TYPE II (ICD-250.00) Assessment: Unchanged  The following medications were removed from the medication list:    Glipizide 5 Mg Tabs (Glipizide) ..... One tab by mouth once daily  Her updated medication list for this problem includes:    Aspirin 81 Mg Tbec (Aspirin) ..... One tab by mouth once daily    Lisinopril 20 Mg Tabs (Lisinopril) ..... One tab by mouth once daily  Orders: Glucose, (CBG) [phone])  Labs Reviewed: HgBA1c: 6.6 (07/24/2008)   Creat: 0.93 (07/18/2008)   Microalbumin: <0.20 mg/dL (86/57/8469)   Problem # 2:  OBESITY (ICD-278.00) Assessment: Improved  Problem # 3:  HYPERLIPIDEMIA (ICD-272.4) Assessment: Unchanged  Her updated medication list for this problem includes:    Simvastatin 40 Mg Tabs (Simvastatin) ..... One tab by mouth at bedtime  Labs Reviewed: Chol: 140 (07/18/2008)   HDL: 40 (07/18/2008)   LDL: 75 (07/18/2008)   TG: 127 (07/18/2008) SGOT: 14 (07/18/2008)   SGPT: 14 (07/18/2008)   Complete Medication List: 1)  Lexapro 10 Mg Tabs (Escitalopram oxalate) .Marland Kitchen.. 1and half tab by mouth once daily 2)  Ibuprofen 800 Mg Tabs (Ibuprofen) .... One tab by mouth once daily as needed 3)  Verapamil Hcl Cr 180 Mg Tbcr (Verapamil hcl) .... Two tabs by mouth once daily 4)  Furosemide 40 Mg Tabs (Furosemide) ....  One tab by mouth once daily 5)  Budeprion Xl 300 Mg Tb24 (Bupropion hcl) .... One tab by mouth once daily 6)  Alprazolam 0.5 Mg Tabs (Alprazolam) .... One tab by mouth three times a day as needed 7)  Fluticasone Propionate 50 Mcg/act Susp (Fluticasone propionate) .... Two puffs each nostril once daily 8)  Klor-con M20 20 Meq Tbcr (Potassium chloride crys cr) .Marland KitchenMarland KitchenMarland Kitchen  One tab by mouth two times a day 9)  Simvastatin 40 Mg Tabs (Simvastatin) .... One tab by mouth at bedtime 10)  Patanol 0.1 % Soln (Olopatadine hcl) .... Uad 11)  Aspirin 81 Mg Tbec (Aspirin) .... One tab by mouth once daily 12)  Lisinopril 20 Mg Tabs (Lisinopril) .... One tab by mouth once daily 13)  Carisoprodol 350 Mg Tabs (Carisoprodol) .... Take one tab by mouth once daily 14)  Omeprazole 20 Mg Cpdr (Omeprazole) .... Take one cap by mouth once daily 15)  Hydroxyzine Hcl 25 Mg Tabs (Hydroxyzine hcl) .... Take one tab by mouth at bedtime 16)  Norvasc 5 Mg Tabs (Amlodipine besylate) .... Take 1 tablet by mouth once a day 17)  Phentermine Hcl 37.5 Mg Caps (Phentermine hcl) .... Take 1 capsule by mouth once a day 18)  Cetirizine Hcl 10 Mg Tabs (Cetirizine hcl) .... One tab by mouth once daily 19)  Ultracet 37.5-325 Mg Tabs (Tramadol-acetaminophen) .Marland Kitchen.. 1 by mouth q 4 as needed pain 20)  Hydrocodone-acetaminophen 5-500 Mg Tabs (Hydrocodone-acetaminophen) .... Take one tab every 6 hours for pain 21)  Naproxen 500 Mg Tabs (Naproxen) .... Take one tab by mouth two times a day 22)  Singulair 10 Mg Tabs (Montelukast sodium) .... Take 1 tablet by mouth once a day 23)  Astelin 137 Mcg/spray Soln (Azelastine hcl) .... 2 puffs per nostril twice daily 24)  Adipex-p 37.5 Mg Tabs (Phentermine hcl) .... Take 1 tablet by mouth once a day   Patient Instructions: 1)  F/U in early january. 2)  CONGRATS on yourcontinued weight loss,please keep it up. 3)  Pls keep your mamogram.   Prescriptions: FUROSEMIDE 40 MG  TABS (FUROSEMIDE) one tab by mouth  once daily  #30 x 5   Entered by:   Worthy Keeler LPN   Authorized by:   Syliva Overman MD   Signed by:   Worthy Keeler LPN on 93/23/5573   Method used:   Electronically to        [facility] (retail)       [address]       [place], [place]  [address]       Ph: [phone]       Fax: [phone]   RxID:   [id number] CETIRIZINE HCL 10 MG TABS (CETIRIZINE HCL) one tab by mouth once daily  #30 x 5   Entered by:   Worthy Keeler LPN   Authorized by:   Syliva Overman MD   Signed by:   Worthy Keeler LPN on 03/50/0938   Method used:   Electronically to        [facility]* (retail)       [address] [place], [place]  [address]       Ph: [phone]       Fax: [phone]   RxID:   [id number] HYDROXYZINE HCL 25 MG  TABS (HYDROXYZINE HCL) take one tab by mouth at bedtime  #30 x 5   Entered by:   Worthy Keeler LPN   Authorized by:   Syliva Overman MD   Signed by:   Worthy Keeler LPN on 31/54/0086   Method used:   Electronically to        [facility] (retail)       [address]  [place], [place]  [address]       Ph: [phone]       Fax: [phone]   RxID:   [id number] CARISOPRODOL 350 MG  TABS (CARISOPRODOL) take one tab by mouth once daily  #30 x 5   Entered by:   Worthy Keeler LPN   Authorized by:   Syliva Overman MD   Signed by:   Worthy Keeler LPN on 56/38/7564   Method used:   Electronically to        [facility] (retail)       [address]       [place], [place]  [address]       Ph: [phone]       Fax: [phone]   RxID:   [id number] SIMVASTATIN 40 MG  TABS (SIMVASTATIN) one tab by mouth at bedtime  #30 x 5   Entered by:   Worthy Keeler LPN   Authorized by:   Syliva Overman MD   Signed by:   Worthy Keeler LPN on 76/28/3151   Method used:   Electronically to         [facility] (retail)       [address]       [place], [place]  [address]       Ph: [phone]       Fax: [phone]   RxID:   [id number] VERAPAMIL HCL CR 180 MG  TBCR (VERAPAMIL HCL) two tabs by mouth once daily  #60 x 5   Entered by:   Worthy Keeler LPN   Authorized by:   Syliva Overman MD   Signed by:   Worthy Keeler LPN on 89/38/1017   Method used:   Electronically to        [facility] (retail)       [address] [place], [place]  [address]       Ph: [phone]       Fax: [phone]   RxID:   [id number] ADIPEX-P 37.5 MG TABS (PHENTERMINE HCL) Take 1 tablet by mouth once a day  #42 x 0   Entered and Authorized by:   Syliva Overman MD   Signed by:   Syliva Overman MD on 09/04/2008   Method used:   Printed then faxed to ...       [facility] (retail)       [address]       [place], [place]  [address]       Ph: [phone]       Fax: [phone]   RxID:   [id number] ASTELIN 137 MCG/SPRAY SOLN (AZELASTINE HCL) 2 puffs per nostril twice daily  #1 x 4   Entered and Authorized by:   Syliva Overman MD   Signed by:   Syliva Overman MD on 09/04/2008   Method used:   Electronically to        [facility] (retail)       [address] [place], [place]  [address]       Ph: [phone]       Fax: [phone]   RxID:   [id number] SINGULAIR 10 MG TABS (MONTELUKAST SODIUM) Take 1 tablet  by mouth once a day  #30 x 4   Entered and Authorized by:   Syliva Overman MD   Signed by:   Syliva Overman MD on 09/04/2008   Method used:   Electronically to        [facility] (retail)       [address] [place], [place]  [address]       Ph: [phone]       Fax: [phone]   RxID:   [id number]  ] Laboratory Results     Blood Tests   Date/Time Received: September 04, 2008 4pm Date/Time Reported: September 04, 2008 4pm  Glucose (random): 112 mg/dL   (Normal Range: 27-035)

## 2010-11-25 NOTE — Letter (Signed)
Summary: medical release  medical release   Imported By: Lind Guest 08/23/2009 11:01:17  _____________________________________________________________________  External Attachment:    Type:   Image     Comment:   External Document

## 2010-11-25 NOTE — Letter (Signed)
Summary: Letter  Letter   Imported By: Lind Guest 06/07/2009 14:46:30  _____________________________________________________________________  External Attachment:    Type:   Image     Comment:   External Document

## 2010-11-25 NOTE — Progress Notes (Signed)
  Phone Note Call from Patient   Summary of Call: wants something sent in for dry cough. She said she prefers syrup. I told her nothing was covered rx by medicare/medicaid and since her BP and sugar are extremely high, what do you recommend otc? Initial call taken by: Everitt Amber LPN,  July 18, 2010 10:10 AM  Follow-up for Phone Call        sugar free robitussin  Follow-up by: Syliva Overman MD,  July 18, 2010 12:10 PM  Additional Follow-up for Phone Call Additional follow up Details #1::        Patient aware Additional Follow-up by: Everitt Amber LPN,  July 18, 2010 1:07 PM

## 2010-11-25 NOTE — Progress Notes (Signed)
  Phone Note From Pharmacy   Caller: [facility] Summary of Call: requesting refill on phentermine Initial call taken by: Adella Hare LPN,  February 04, 2010 5:07 PM  Follow-up for Phone Call        refill x1 only, needs ov in 4 weeks Follow-up by: Syliva Overman MD,  February 04, 2010 5:19 PM  Additional Follow-up for Phone Call Additional follow up Details #1::        Phone call completed Additional Follow-up by: Adella Hare LPN,  February 05, 2010 9:31 AM    New/Updated Medications: PHENTERMINE HCL 37.5 MG TABS (PHENTERMINE HCL) one tab by mouth once daily Prescriptions: PHENTERMINE HCL 37.5 MG TABS (PHENTERMINE HCL) one tab by mouth once daily  #30 x 0   Entered by:   Adella Hare LPN   Authorized by:   Syliva Overman MD   Signed by:   Adella Hare LPN on 21/30/8657   Method used:   Printed then faxed to ...       [facility] (retail)       [address]       [place], [place]  [address]       Ph: [phone]       Fax: [phone]   RxID:   [id number]

## 2010-11-25 NOTE — Assessment & Plan Note (Signed)
Summary: office visit   Vital Signs:  Patient profile:   45 year old female Menstrual status:  regular Height:      59 inches (149.86 cm) Weight:      219 pounds (99.55 kg) BMI:     44.39 BSA:     1.92 O2 Sat:      94 % Pulse rate:   86 / minute Pulse rhythm:   regular Resp:     16 per minute BP sitting:   120 / 88  (left arm) Cuff size:   large  Vitals Entered By: Everitt Amber (February 18, 2009 4:41 PM)  Nutrition Counseling: Patient's BMI is greater than 25 and therefore counseled on weight management options. CC: Follow up chronci problems  years   days  Menstrual Status regular Last PAP Result Normal   CC:  Follow up chronci problems.  History of Present Illness: Pt in fore review ofchronic medical illnesses as well as recent review of labs. She denies any recent fever or chills. Sheis disappointed in her inability to lose weight since her last visit and plans to increase her efforts to do so. She denies polyuria, polydypsia , blurred vision or hypoglycemic episodes. she denies alcohol or drug use. She denies depression or anxiety  Current Medications (verified): 1)  Ibuprofen 800 Mg  Tabs (Ibuprofen) .... One Tab By Mouth Once Daily As Needed 2)  Verapamil Hcl Cr 180 Mg  Tbcr (Verapamil Hcl) .... Two Tabs By Mouth Once Daily 3)  Furosemide 40 Mg  Tabs (Furosemide) .... One Tab By Mouth Once Daily 4)  Alprazolam 0.5 Mg  Tabs (Alprazolam) .... One Tab By Mouth Three Times A Day As Needed 5)  Fluticasone Propionate 50 Mcg/act  Susp (Fluticasone Propionate) .... Two Puffs Each Nostril Once Daily 6)  Klor-Con M20 20 Meq  Tbcr (Potassium Chloride Crys Cr) .... One Tab By Mouth Two Times A Day 7)  Simvastatin 40 Mg  Tabs (Simvastatin) .... One Tab By Mouth At Bedtime 8)  Patanol 0.1 %  Soln (Olopatadine Hcl) .... Uad 9)  Aspirin 81 Mg  Tbec (Aspirin) .... One Tab By Mouth Once Daily 10)  Lisinopril 20 Mg  Tabs (Lisinopril) .... One Tab By Mouth Once Daily 11)  Carisoprodol  350 Mg  Tabs (Carisoprodol) .... Take One Tab By Mouth Once Daily 12)  Omeprazole 20 Mg  Cpdr (Omeprazole) .... Take One Cap By Mouth Once Daily 13)  Hydroxyzine Hcl 25 Mg  Tabs (Hydroxyzine Hcl) .... Take One Tab By Mouth At Bedtime 14)  Norvasc 5 Mg  Tabs (Amlodipine Besylate) .... Take 1 Tablet By Mouth Once A Day 15)  Phentermine Hcl 37.5 Mg Caps (Phentermine Hcl) .... Take 1 Capsule By Mouth Once A Day 16)  Cetirizine Hcl 10 Mg Tabs (Cetirizine Hcl) .... One Tab By Mouth Once Daily 17)  Singulair 10 Mg Tabs (Montelukast Sodium) .... Take 1 Tablet By Mouth Once A Day 18)  Astelin 137 Mcg/spray Soln (Azelastine Hcl) .... 2 Puffs Per Nostril Twice Daily 19)  Ibuprofen 800 Mg Tabs (Ibuprofen) .... One Tab By Mouth Once Daily Prn 20)  Hydralazine Hcl 50 Mg Tabs (Hydralazine Hcl) .... One Tab By Mouth Tid  Allergies: 1)  ! * Mca  Review of Systems General:  Denies chills and fever. ENT:  Denies hoarseness, postnasal drainage, sinus pressure, and sore throat. CV:  Denies chest pain or discomfort, difficulty breathing while lying down, palpitations, shortness of breath with exertion, and swelling of feet. Resp:  Denies cough and sputum productive. GI:  Denies abdominal pain, constipation, diarrhea, nausea, and vomiting. GU:  Denies dysuria and urinary frequency. MS:  Complains of low back pain and mid back pain; denies joint pain and stiffness. Derm:  Denies itching, lesion(s), and rash. Psych:  Denies anxiety and depression.  Physical Exam  General:  alert, well-hydrated, and overweight-appearing.  HEENT: No facial asymmetry,  EOMI, No sinus tenderness, TM's Clear, oropharynx  pink and moist.   Chest: Clear to auscultation bilaterally.  CVS: S1, S2, No murmurs, No S3.   Abd: Soft, Nontender.  MS: Adequate ROM spine, hips, shoulders and knees.  Ext: No edema.   CNS: CN 2-12 intact, power tone and sensation normal throughout.   Skin: Intact, no visible lesions or rashes.  Psych: Good  eye contact, normal affect.  Memory intact, not anxious or depressed appearing.     Impression & Recommendations:  Problem # 1:  DIABETES MELLITUS, TYPE II (ICD-250.00) Assessment Improved  Her updated medication list for this problem includes:    Aspirin 81 Mg Tbec (Aspirin) ..... One tab by mouth once daily    Lisinopril 20 Mg Tabs (Lisinopril) ..... One tab by mouth once daily  Orders: Glucose, (CBG) (82962) Hemoglobin A1C (83036)  Labs Reviewed: Creat: 0.87 (02/14/2009)    Reviewed HgBA1c results: 5.8 (02/18/2009)  6.0 (11/07/2008)  Problem # 2:  HYPERTENSION (ICD-401.9) Assessment: Unchanged  Her updated medication list for this problem includes:    Verapamil Hcl Cr 180 Mg Tbcr (Verapamil hcl) .Marland Kitchen..Marland Kitchen Two tabs by mouth once daily    Furosemide 40 Mg Tabs (Furosemide) ..... One tab by mouth once daily    Lisinopril 20 Mg Tabs (Lisinopril) ..... One tab by mouth once daily    Norvasc 5 Mg Tabs (Amlodipine besylate) .Marland Kitchen... Take 1 tablet by mouth once a day    Hydralazine Hcl 50 Mg Tabs (Hydralazine hcl) ..... One tab by mouth tid  BP today: 120/88 Prior BP: 110/70 (11/07/2008)  Labs Reviewed: K+: 4.0 (02/14/2009) Creat: : 0.87 (02/14/2009)   Chol: 208 (02/14/2009)   HDL: 57 (02/14/2009)   LDL: 130 (02/14/2009)   TG: 103 (02/14/2009)  Problem # 3:  HYPERLIPIDEMIA (ICD-272.4) Assessment: Deteriorated  The following medications were removed from the medication list:    Simvastatin 40 Mg Tabs (Simvastatin) ..... One tab by mouth at bedtime Her updated medication list for this problem includes:    Simvastatin 80 Mg Tabs (Simvastatin) .Marland Kitchen... Take 1 tab by mouth at bedtime  Labs Reviewed: SGOT: 14 (07/18/2008)   SGPT: 14 (07/18/2008)   HDL:57 (02/14/2009), 40 (07/18/2008)  LDL:130 (02/14/2009), 75 (24/23/5361)  Chol:208 (02/14/2009), 140 (07/18/2008)  Trig:103 (02/14/2009), 127 (07/18/2008)  Problem # 4:  OBESITY (ICD-278.00) Assessment: Unchanged  Ht: 59 (02/18/2009)    Wt: 219 (02/18/2009)   BMI: 44.39 (02/18/2009)  Complete Medication List: 1)  Ibuprofen 800 Mg Tabs (Ibuprofen) .... One tab by mouth once daily as needed 2)  Verapamil Hcl Cr 180 Mg Tbcr (Verapamil hcl) .... Two tabs by mouth once daily 3)  Furosemide 40 Mg Tabs (Furosemide) .... One tab by mouth once daily 4)  Alprazolam 0.5 Mg Tabs (Alprazolam) .... One tab by mouth three times a day as needed 5)  Fluticasone Propionate 50 Mcg/act Susp (Fluticasone propionate) .... Two puffs each nostril once daily 6)  Klor-con M20 20 Meq Tbcr (Potassium chloride crys cr) .... One tab by mouth two times a day 7)  Patanol 0.1 % Soln (Olopatadine hcl) .... Uad 8)  Aspirin 81 Mg Tbec (Aspirin) .... One tab by mouth once daily 9)  Lisinopril 20 Mg Tabs (Lisinopril) .... One tab by mouth once daily 10)  Carisoprodol 350 Mg Tabs (Carisoprodol) .... Take one tab by mouth once daily 11)  Omeprazole 20 Mg Cpdr (Omeprazole) .... Take one cap by mouth once daily 12)  Hydroxyzine Hcl 25 Mg Tabs (Hydroxyzine hcl) .... Take one tab by mouth at bedtime 13)  Norvasc 5 Mg Tabs (Amlodipine besylate) .... Take 1 tablet by mouth once a day 14)  Phentermine Hcl 37.5 Mg Caps (Phentermine hcl) .... Take 1 capsule by mouth once a day 15)  Cetirizine Hcl 10 Mg Tabs (Cetirizine hcl) .... One tab by mouth once daily 16)  Singulair 10 Mg Tabs (Montelukast sodium) .... Take 1 tablet by mouth once a day 17)  Astelin 137 Mcg/spray Soln (Azelastine hcl) .... 2 puffs per nostril twice daily 18)  Ibuprofen 800 Mg Tabs (Ibuprofen) .... One tab by mouth once daily prn 19)  Hydralazine Hcl 50 Mg Tabs (Hydralazine hcl) .... One tab by mouth tid 20)  Simvastatin 80 Mg Tabs (Simvastatin) .... Take 1 tab by mouth at bedtime 21)  Phentermine Hcl 37.5 Mg Tabs (Phentermine hcl) .... Take 1 tablet by mouth once a day  Patient Instructions: 1)  Please schedule a follow-up appointment in 1 month. 2)  It is important that you exercise regularly at  least 90 minutes 7 times a week. If you develop chest pain, have severe difficulty breathing, or feel very tired , stop exercising immediately and seek medical attention. 3)  You need to lose weight. Consider a lower calorie diet and regular exercise. 4)  Continue the phentermine as before. 5)  inc the simvastatin 40 mg TWO at bedtime , and a new scriptis sent in for 80mg  1 at bedtime.  Prescriptions: PHENTERMINE HCL 37.5 MG TABS (PHENTERMINE HCL) Take 1 tablet by mouth once a day  #30 x 0   Entered and Authorized by:   Syliva Overman MD   Signed by:   Syliva Overman MD on 02/18/2009   Method used:   Printed then faxed to ...       [facility] (retail)       [address]       [place], [place]  [address]       Ph: [phone]       Fax: [phone]   RxID:   [id number] SIMVASTATIN 80 MG TABS (SIMVASTATIN) Take 1 tab by mouth at bedtime  #30 x 4   Entered and Authorized by:   Syliva Overman MD   Signed by:   Syliva Overman MD on 02/18/2009   Method used:   Electronically to        [facility] (retail)       [address]       [place], [place]  [address]       Ph: [phone]       Fax: [phone]   RxID:   [id number]    Laboratory Results   Blood Tests   Date/Time Received: 02/18/09 Date/Time Reported: 02/18/09  Glucose (random): 89 mg/dL   (Normal Range: 63-016) HGBA1C: 5.8%   (Normal Range: Non-Diabetic - 3-6%   Control Diabetic - 6-8%)

## 2010-11-25 NOTE — Progress Notes (Signed)
Summary: CALL  Phone Note Call from Patient   Summary of Call: CALL IN SOMETHING FOR HER PAIN IN HER LEG AND THE IBU. IS DOING NO GOOD  [place] [facility] CALL HER BACK AND LET HER KNOW AT [phone] Initial call taken by: Lind Guest,  April 17, 2009 2:52 PM  Follow-up for Phone Call        pls advise and erx tramadol 50 mg one twice daily as needed #30 only, no refills Follow-up by: Syliva Overman MD,  April 17, 2009 3:18 PM  Additional Follow-up for Phone Call Additional follow up Details #1::        Rx Called In, called patient, no answer Additional Follow-up by: Worthy Keeler LPN,  April 17, 2009 3:22 PM    Additional Follow-up for Phone Call Additional follow up Details #2::    patient aware Follow-up by: Worthy Keeler LPN,  April 17, 2009 4:44 PM  New/Updated Medications: TRAMADOL HCL 50 MG TABS (TRAMADOL HCL) one tab by mouth  twice daily as needed   Prescriptions: TRAMADOL HCL 50 MG TABS (TRAMADOL HCL) one tab by mouth  twice daily as needed  #30 x 0   Entered by:   Worthy Keeler LPN   Authorized by:   Syliva Overman MD   Signed by:   Worthy Keeler LPN on 16/07/9603   Method used:   Electronically to        [facility] (retail)       [address] [place], [place]  [address]       Ph: [phone]       Fax: [phone]   RxID:   [id number]

## 2010-11-25 NOTE — Miscellaneous (Signed)
Summary: refill  Clinical Lists Changes  Medications: Added new medication of CETIRIZINE HCL 10 MG TABS (CETIRIZINE HCL) one tab by mouth once daily - Signed Rx of CETIRIZINE HCL 10 MG TABS (CETIRIZINE HCL) one tab by mouth once daily;  #30 x 2;  Signed;  Entered by: Worthy Keeler LPN;  Authorized by: Syliva Overman MD;  Method used: Electronically to [facility] [address], [place], [place], [place]  [address], Ph: [phone], Fax: [phone]    Prescriptions: CETIRIZINE HCL 10 MG TABS (CETIRIZINE HCL) one tab by mouth once daily  #30 x 2   Entered by:   Worthy Keeler LPN   Authorized by:   Syliva Overman MD   Signed by:   Worthy Keeler LPN on 13/05/6577   Method used:   Electronically to        [facility] (retail)       [address]       [place], [place]  [address]       Ph: [phone]       Fax: [phone]   RxID:   [id number]

## 2010-11-25 NOTE — Miscellaneous (Signed)
Summary: Refill  Clinical Lists Changes  Medications: Rx of CARISOPRODOL 350 MG  TABS (CARISOPRODOL) take one tab by mouth once daily;  #30 x 5;  Signed;  Entered by: Everitt Amber;  Authorized by: Syliva Overman MD;  Method used: Electronically to [facility]*, [address], [place], [place], [place]  [address], Ph: [phone], Fax: [phone]    Prescriptions: CARISOPRODOL 350 MG  TABS (CARISOPRODOL) take one tab by mouth once daily  #30 x 5   Entered by:   Everitt Amber   Authorized by:   Syliva Overman MD   Signed by:   Everitt Amber on 04/16/2009   Method used:   Electronically to        [facility] (retail)       [address] [place], [place]  [address]       Ph: [phone]       Fax: [phone]   RxID:   [id number]

## 2010-11-25 NOTE — Progress Notes (Signed)
Summary: medicines  Phone Note Call from Patient   Summary of Call: needs to get a rx for alprozam to [place] [phone] Initial call taken by: Rudene Anda,  Mar 20, 2009 8:54 AM  Follow-up for Phone Call        Rx Called In Follow-up by: Worthy Keeler LPN,  Mar 20, 2009 9:08 AM      Prescriptions: ALPRAZOLAM 0.5 MG  TABS (ALPRAZOLAM) one tab by mouth three times a day as needed  #90 x 4   Entered by:   Worthy Keeler LPN   Authorized by:   Syliva Overman MD   Signed by:   Worthy Keeler LPN on 70/62/3762   Method used:   Printed then faxed to ...       [facility] (retail)       [address]       [place], [place]  [address]       Ph: [phone]       Fax: [phone]   RxID:   [id number]

## 2010-11-25 NOTE — Assessment & Plan Note (Signed)
Summary: office visit   Vital Signs:  Patient profile:   45 year old female Menstrual status:  regular Height:      59 inches Weight:      249.75 pounds BMI:     50.63 O2 Sat:      94 % on Room air Pulse rate:   87 / minute Pulse rhythm:   regular Resp:     16 per minute BP sitting:   150 / 108  (right arm)  Vitals Entered By: Everitt Amber LPN  Nutrition Counseling: Patient's BMI is greater than 25 and therefore counseled on weight management options.  O2 Flow:  Room air CC: Follow up chronic problems   Primary Care Miche Loughridge:  Syliva Overman MD  CC:  Follow up chronic problems.  History of Present Illness: Reports  that she is not doing well. She is sharing her 1 bedroom apt with her son and his fam of 4. Sh e is mentally and physically stressed about  this. States she sometimes sleeps on the floor, she c/o pain in back , shoulders hips and knees. Requesting pain meds, she is alrfeady on meds however.  Denies recent fever or chills. Denies sinus pressure, nasal congestion , ear pain or sore throat. Denies chest congestion, or cough productive of sputum. Denies chest pain, palpitations, PND, orthopnea or leg swelling. Denies abdominal pain, nausea, vomitting, diarrhea or constipation. Denies change in bowel movements or bloody stool. Denies dysuria , frequency, incontinence or hesitancy. . Denies headaches, vertigo, seizures. she has gained approx 40 poounds , has symptoms suggestive of uncontrolled blood sugar and is currently o n no meds Denies  rash, lesions, or itch.     Preventive Screening-Counseling & Management  Alcohol-Tobacco     Smoking Cessation Counseling: yes  Current Medications (verified): 1)  Furosemide 40 Mg  Tabs (Furosemide) .... One Tab By Mouth Once Daily 2)  Alprazolam 0.5 Mg  Tabs (Alprazolam) .... One Tab By Mouth Three Times A Day As Needed 3)  Fluticasone Propionate 50 Mcg/act  Susp (Fluticasone Propionate) .... Two Puffs Each Nostril  Once Daily 4)  Klor-Con M20 20 Meq  Tbcr (Potassium Chloride Crys Cr) .... One Tab By Mouth Two Times A Day 5)  Aspirin 81 Mg  Tbec (Aspirin) .... One Tab By Mouth Once Daily 6)  Carisoprodol 350 Mg  Tabs (Carisoprodol) .... Take One Tab By Mouth Once Daily 7)  Omeprazole 20 Mg  Cpdr (Omeprazole) .... Take One Cap By Mouth Once Daily 8)  Hydroxyzine Hcl 50 Mg Tabs (Hydroxyzine Hcl) .... Take 1 At Bedtime 9)  Singulair 10 Mg Tabs (Montelukast Sodium) .... Take 1 Tablet By Mouth Once A Day 10)  Astelin 137 Mcg/spray Soln (Azelastine Hcl) .... 2 Puffs Per Nostril Twice Daily 11)  Ibuprofen 800 Mg Tabs (Ibuprofen) .... One Tab By Mouth Once Daily Prn 12)  Tramadol Hcl 50 Mg Tabs (Tramadol Hcl) .... One Tab By Mouth  Twice Daily As Needed 13)  Lisinopril 40 Mg Tabs (Lisinopril) .... Take 1 Tablet By Mouth Once A Day 14)  Maxzide 75-50 Mg Tabs (Triamterene-Hctz) .... Take 1 Tablet By Mouth Once A Day 15)  Aspirin 325 Mg Tabs (Aspirin) .... Once Daily 16)  Zyrtec Allergy 10 Mg Tabs (Cetirizine Hcl) .... Once Daily 17)  Amlodipine Besylate 10 Mg Tabs (Amlodipine Besylate) .... Take 1 Tablet By Mouth Once A Day 18)  Simvastatin 80 Mg Tabs (Simvastatin) .... Take 1 Tab By Mouth At Bedtime 19)  Paroxetine Hcl  20 Mg Tabs (Paroxetine Hcl) .... One Tab By Mouth Once Daily  Allergies (verified): 1)  ! * Mca  Review of Systems      See HPI General:  Complains of fatigue, malaise, and sleep disorder. Eyes:  Complains of blurring. MS:  Complains of joint pain, low back pain, mid back pain, and stiffness; generalised aches x 3 days, has no comfortable  place to sleep. Psych:  Complains of anxiety, depression, irritability, and mental problems. Endo:  Complains of excessive thirst and excessive urination.  Physical Exam  General:  Well-developed,obese,in no acute distress; alert,appropriate and cooperative throughout examination HEENT: No facial asymmetry,  EOMI, No sinus tenderness, TM's Clear,  oropharynx  pink and moist.   Chest: Clear to auscultation bilaterally.  CVS: S1, S2, No murmurs, No S3.   Abd: Soft, Nontender. obese MS: decreased  ROM spine,adequate in  hips, shoulders and knees.  Ext: No edema.   CNS: CN 2-12 intact, power tone and sensation normal throughout.   Skin: Intact, no visible lesions or rashes.  Psych: Good eye contact, normal affect.  Memory intact, not anxious or depressed appearing.    Impression & Recommendations:  Problem # 1:  ANXIETY (ICD-300.00) Assessment Deteriorated  Her updated medication list for this problem includes:    Alprazolam 0.5 Mg Tabs (Alprazolam) ..... One tab by mouth three times a day as needed    Hydroxyzine Hcl 50 Mg Tabs (Hydroxyzine hcl) .Marland Kitchen... Take 1 at bedtime    Paroxetine Hcl 20 Mg Tabs (Paroxetine hcl) ..... One tab by mouth once daily  Problem # 2:  DEPRESSION (ICD-311) Assessment: Deteriorated  Her updated medication list for this problem includes:    Alprazolam 0.5 Mg Tabs (Alprazolam) ..... One tab by mouth three times a day as needed    Hydroxyzine Hcl 50 Mg Tabs (Hydroxyzine hcl) .Marland Kitchen... Take 1 at bedtime    Paroxetine Hcl 20 Mg Tabs (Paroxetine hcl) ..... One tab by mouth once daily  Problem # 3:  DIABETES MELLITUS, TYPE II (ICD-250.00) Assessment: Deteriorated  The following medications were removed from the medication list:    Diovan Hct 320-12.5 Mg Tabs (Valsartan-hydrochlorothiazide) .Marland Kitchen... Take 1 tablet by mouth once a day Her updated medication list for this problem includes:    Aspirin 81 Mg Tbec (Aspirin) ..... One tab by mouth once daily    Lisinopril 40 Mg Tabs (Lisinopril) .Marland Kitchen... Take 1 tablet by mouth once a day    Aspirin 325 Mg Tabs (Aspirin) ..... Once daily    Metformin Hcl 1000 Mg Tabs (Metformin hcl) .Marland Kitchen... Take 1 tablet by mouth two times a day    Glipizide 5 Mg Xr24h-tab (Glipizide) .Marland Kitchen... Take 1 tablet by mouth every morning  Orders: T- Hemoglobin A1C ([id number]) T-Urine Microalbumin  w/creat. ratio [phone])  Labs Reviewed: Creat: 0.87 (01/07/2010)    Reviewed HgBA1c results: 6.7 (01/07/2010)  6.4 (08/15/2009)  Problem # 4:  OBESITY (ICD-278.00) Assessment: Deteriorated  Ht: 59 (07/17/2010)   Wt: 249.75 (07/17/2010)   BMI: 50.63 (07/17/2010) therapeutic lifestyle change discussed and encouraged  Problem # 5:  HYPERTENSION (ICD-401.9) Assessment: Deteriorated  The following medications were removed from the medication list:    Verapamil Hcl Cr 180 Mg Tbcr (Verapamil hcl) .Marland Kitchen..Marland Kitchen Two tabs by mouth once daily    Norvasc 10 Mg Tabs (Amlodipine besylate) .Marland Kitchen... Take 1 tablet by mouth once a day    Diovan Hct 320-12.5 Mg Tabs (Valsartan-hydrochlorothiazide) .Marland Kitchen... Take 1 tablet by mouth once a day Her updated medication list  for this problem includes:    Furosemide 40 Mg Tabs (Furosemide) ..... One tab by mouth once daily    Lisinopril 40 Mg Tabs (Lisinopril) .Marland Kitchen... Take 1 tablet by mouth once a day    Maxzide 75-50 Mg Tabs (Triamterene-hctz) .Marland Kitchen... Take 1 tablet by mouth once a day    Amlodipine Besylate 10 Mg Tabs (Amlodipine besylate) .Marland Kitchen... Take 1 tablet by mouth once a day    Clonidine Hcl 0.3 Mg Tabs (Clonidine hcl) .Marland Kitchen... Take 1 tab by mouth at bedtime  Orders: T-Basic Metabolic Panel [id number])  BP today: 150/108 Prior BP: 118/80 (04/17/2010)  Labs Reviewed: K+: 4.0 (01/07/2010) Creat: : 0.87 (01/07/2010)   Chol: 230 (01/07/2010)   HDL: 49 (01/07/2010)   LDL: See Comment mg/dL (29/56/2130)   TG: 865 (01/07/2010)  Problem # 6:  BACK PAIN (ICD-724.5) Assessment: Deteriorated  The following medications were removed from the medication list:    Hydrocodone-acetaminophen 5-500 Mg Tabs (Hydrocodone-acetaminophen) .Marland Kitchen... Take one every 4-6 hours as needed Her updated medication list for this problem includes:    Aspirin 81 Mg Tbec (Aspirin) ..... One tab by mouth once daily    Carisoprodol 350 Mg Tabs (Carisoprodol) .Marland Kitchen... Take one tab by mouth once  daily    Ibuprofen 800 Mg Tabs (Ibuprofen) ..... One tab by mouth once daily prn    Tramadol Hcl 50 Mg Tabs (Tramadol hcl) ..... One tab by mouth  twice daily as needed    Aspirin 325 Mg Tabs (Aspirin) ..... Once daily  Orders: Ketorolac-Toradol 15mg  (H8469) Depo- Medrol 80mg  (J1040) Admin of Therapeutic Inj  intramuscular or subcutaneous (62952)  Complete Medication List: 1)  Furosemide 40 Mg Tabs (Furosemide) .... One tab by mouth once daily 2)  Alprazolam 0.5 Mg Tabs (Alprazolam) .... One tab by mouth three times a day as needed 3)  Fluticasone Propionate 50 Mcg/act Susp (Fluticasone propionate) .... Two puffs each nostril once daily 4)  Klor-con M20 20 Meq Tbcr (Potassium chloride crys cr) .... One tab by mouth two times a day 5)  Aspirin 81 Mg Tbec (Aspirin) .... One tab by mouth once daily 6)  Carisoprodol 350 Mg Tabs (Carisoprodol) .... Take one tab by mouth once daily 7)  Omeprazole 20 Mg Cpdr (Omeprazole) .... Take one cap by mouth once daily 8)  Hydroxyzine Hcl 50 Mg Tabs (Hydroxyzine hcl) .... Take 1 at bedtime 9)  Singulair 10 Mg Tabs (Montelukast sodium) .... Take 1 tablet by mouth once a day 10)  Astelin 137 Mcg/spray Soln (Azelastine hcl) .... 2 puffs per nostril twice daily 11)  Ibuprofen 800 Mg Tabs (Ibuprofen) .... One tab by mouth once daily prn 12)  Tramadol Hcl 50 Mg Tabs (Tramadol hcl) .... One tab by mouth  twice daily as needed 13)  Lisinopril 40 Mg Tabs (Lisinopril) .... Take 1 tablet by mouth once a day 14)  Maxzide 75-50 Mg Tabs (Triamterene-hctz) .... Take 1 tablet by mouth once a day 15)  Aspirin 325 Mg Tabs (Aspirin) .... Once daily 16)  Zyrtec Allergy 10 Mg Tabs (Cetirizine hcl) .... Once daily 17)  Amlodipine Besylate 10 Mg Tabs (Amlodipine besylate) .... Take 1 tablet by mouth once a day 18)  Paroxetine Hcl 20 Mg Tabs (Paroxetine hcl) .... One tab by mouth once daily 19)  Clonidine Hcl 0.3 Mg Tabs (Clonidine hcl) .... Take 1 tab by mouth at bedtime 20)   Lovastatin 40 Mg Tabs (Lovastatin) .... 2 tablets at bedtime 21)  Metformin Hcl 1000 Mg Tabs (Metformin hcl) .Marland KitchenMarland KitchenMarland Kitchen  Take 1 tablet by mouth two times a day 22)  Glipizide 5 Mg Xr24h-tab (Glipizide) .... Take 1 tablet by mouth every morning  Other Orders: T-Hepatic Function [phone]) T-Lipid Profile [phone]) T-TSH [id number]) T-CBC w/Diff [id number]) Radiology Referral (Radiology) Influenza Vaccine MCR [phone])  Patient Instructions: 1)  CPE in 5 weeks 2)  Tobacco is very bad for your health and your loved ones! You Should stop smoking!. 3)  Stop Smoking Tips: Choose a Quit date. Cut down before the Quit date. decide what you will do as a substitute when you feel the urge to smoke(gum,toothpick,exercise). 4)  BMP prior to visit, ICD-9: 5)  Hepatic Panel prior to visit, ICD-9: 6)  Lipid Panel prior to visit, ICD-9: 7)  TSH prior to visit, ICD-9:   today 8)  HbgA1C prior to visit, ICD-9: 9)  CBC w/ Diff prior to visit, ICD-9: 10)  Your BP is too high Prescriptions: GLIPIZIDE 5 MG XR24H-TAB (GLIPIZIDE) Take 1 tablet by mouth every morning  #30 x 3   Entered and Authorized by:   Syliva Overman MD   Signed by:   Syliva Overman MD on 07/18/2010   Method used:   Electronically to        [facility] (retail)       [address] [place], [place]  [address]       Ph: [phone]       Fax: [phone]   RxID:   [id number] METFORMIN HCL 1000 MG TABS (METFORMIN HCL) Take 1 tablet by mouth two times a day  #60 x 3   Entered and Authorized by:   Syliva Overman MD   Signed by:   Syliva Overman MD on 07/18/2010   Method used:   Electronically to        [facility] (retail)       [address]       [place], [place]  [address]       Ph: [phone]       Fax: [phone]   RxID:   [id number] LISINOPRIL 40 MG TABS (LISINOPRIL) Take 1 tablet by mouth once a day  #30 x 3   Entered by:    Everitt Amber LPN   Authorized by:   Syliva Overman MD   Signed by:   Everitt Amber LPN on 16/04/3709   Method used:   Electronically to        [facility] (retail)       [address]       [place], [place]  [address]       Ph: [phone]       Fax: [phone]   RxID:   [id number] SINGULAIR 10 MG TABS (MONTELUKAST SODIUM) Take 1 tablet by mouth once a day Brand medically necessary #30 Each x 3   Entered by:   Everitt Amber LPN   Authorized by:   Syliva Overman MD   Signed by:   Everitt Amber LPN on 02/58/5277   Method used:   Electronically to        [facility] (retail)       [address] [place], [place]  [address]       Ph: [phone]  Fax: [phone]   RxID:   [id number] TRAMADOL HCL 50 MG TABS (TRAMADOL HCL) one tab by mouth  twice daily as needed  #30 Each x 3   Entered by:   Everitt Amber LPN   Authorized by:   Syliva Overman MD   Signed by:   Everitt Amber LPN on 03/47/4259   Method used:   Electronically to        [facility] (retail)       [address] [place], [place]  [address]       Ph: [phone]       Fax: [phone]   RxID:   [id number] AMLODIPINE BESYLATE 10 MG TABS (AMLODIPINE BESYLATE) Take 1 tablet by mouth once a day  #30 x 2   Entered by:   Everitt Amber LPN   Authorized by:   Syliva Overman MD   Signed by:   Everitt Amber LPN on 54/27/0623   Method used:   Electronically to        [facility] (retail)       [address] [place], [place]  [address]       Ph: [phone]       Fax: [phone]   RxID:   [id number] CARISOPRODOL 350 MG  TABS (CARISOPRODOL) take one tab by mouth once daily  #30 Each x 3   Entered by:   Everitt Amber LPN   Authorized by:   Syliva Overman MD   Signed by:   Everitt Amber LPN on 93/71/6967   Method used:   Electronically to         [facility] (retail)       [address] [place], [place]  [address]       Ph: [phone]       Fax: [phone]   RxID:   [id number] IBUPROFEN 800 MG TABS (IBUPROFEN) one tab by mouth once daily prn  #30 x 3   Entered by:   Everitt Amber LPN   Authorized by:   Syliva Overman MD   Signed by:   Everitt Amber LPN on 50/93/2671   Method used:   Electronically to        [facility] (retail)       [address] [place], [place]  [address]       Ph: [phone]       Fax: [phone]   RxID:   [id number] FUROSEMIDE 40 MG  TABS (FUROSEMIDE) one tab by mouth once daily  #30 x 4   Entered by:   Everitt Amber LPN   Authorized by:   Syliva Overman MD   Signed by:   Everitt Amber LPN on 26/83/4196   Method used:   Electronically to        [facility] (retail)       [address]       [place], [place]  [address]       Ph: [phone]       Fax: [phone]   RxID:   [id number] LOVASTATIN 40 MG TABS (LOVASTATIN) 2 tablets at bedtime  #69  x 3   Entered and Authorized by:   Syliva Overman MD   Signed by:   Syliva Overman MD on 07/17/2010   Method used:   Electronically to        [facility] (retail)       [address] [place], [place]  [address]       Ph: [phone]       Fax: [phone]   RxID:   [id number] CLONIDINE HCL 0.3 MG TABS (CLONIDINE HCL) Take 1 tab by mouth at bedtime  #30 x 3   Entered and Authorized by:   Syliva Overman MD   Signed by:   Syliva Overman MD on 07/17/2010   Method used:   Electronically to        [facility] (retail)       [address] [place], [place]  [address]       Ph: [phone]       Fax: [phone]   RxID:   [id number]      Medication Administration  Injection # 1:    Medication: Ketorolac-Toradol  15mg     Diagnosis: BACK PAIN (ICD-724.5)    Route: IM    Site: LUOQ gluteus    Exp Date: 12/2011    Lot #: [id number]     Mfr: [email or web address]    Comments: 60mg  given     Patient tolerated injection without complications    Given by: Everitt Amber LPN (July 17, 2010 4:05 PM)  Injection # 2:    Medication: Depo- Medrol 80mg     Diagnosis: BACK PAIN (ICD-724.5)    Route: IM    Site: RUOQ gluteus    Exp Date: 02/2011    Lot #: 02/[id number]    Mfr: Pharmacia    Comments: 80mg  given     Patient tolerated injection without complications    Given by: Everitt Amber LPN (July 17, 2010 4:07 PM)  Orders Added: 1)  Est. Patient Level IV [[address]] 2)  T-Basic Metabolic Panel [80048-22910] 3)  T-Hepatic Function [80076-22960] 4)  T-Lipid Profile [80061-22930] 5)  T-TSH [63[address]-01093] 6)  T- Hemoglobin A1C [83036-23375] 7)  T-CBC w/Diff [23557-32202] 8)  T-Urine Microalbumin w/creat. ratio [[address]] 9)  Radiology Referral [Radiology] 10)  Ketorolac-Toradol 15mg  [J1885] 11)  Depo- Medrol 80mg  [J1040] 12)  Admin of Therapeutic Inj  intramuscular or subcutaneous [96[address]] 13)  Influenza Vaccine MCR [00025]   Influenza Vaccine    Vaccine Type: Fluvax MCR    Site: left deltoid    Mfr: novartis    Dose: 0.5 ml    Route: IM    Given by: Everitt Amber LPN    Exp. Date: 02/2011    Lot #: [id number]

## 2010-11-25 NOTE — Letter (Signed)
Summary: Appointment Reminder  [facility]  [address]   [place], [place] [address]   Phone: [phone]  Fax: [phone]       October 16, 2009   [name] [address] [place], [place]  [address] December 17, 1965    Dear [name],  We have been unable to reach you by phone to schedule a follow up   appointment that was recommended for you by Dr. Darrick Penna. It is very   important that we reach you to schedule an appointment. We hope that you  allow [name] to participate in your health care needs. Please contact us at  [phone] at your earliest convenience to schedule your appointment.  Sincerely,    [facility] R. Roetta Sessions, M.D.    Kassie Mends, M.D. Lorenza Burton, FNP-BC    Tana Coast, PA-C Phone: [phone]    Fax: [phone]  Appended Document: Appointment Reminder letter returned. bad address. phone number oos. called and left message with emergency contact.

## 2010-11-25 NOTE — Assessment & Plan Note (Signed)
Summary: INJ  Nurse Visit   Vital Signs:  Patient profile:   45 year old female Menstrual status:  regular Height:      59 inches Weight:      219 pounds BMI:     44.39 O2 Sat:      97 % Pulse rate:   101 / minute Resp:     16 per minute BP sitting:   160 / 110  (left arm)  Vitals Entered By: Everitt Amber (June 06, 2009 4:33 PM) CC: Nurse visit for injections for back pain   Allergies: 1)  ! * Mca  Medication Administration  Injection # 1:    Medication: Depo- Medrol 80mg     Diagnosis: BACK PAIN (ICD-724.5)    Route: IM    Site: RUOQ gluteus    Exp Date: 04/2011    Lot #: [id number]    Mfr: Pharmacia    Comments: 80 mg given    Patient tolerated injection without complications    Given by: Everitt Amber (June 06, 2009 4:38 PM)  Injection # 2:    Medication: Ketorolac-Toradol 15mg     Diagnosis: BACK PAIN (ICD-724.5)    Route: IM    Site: LUOQ gluteus    Exp Date: 11/2010    Lot #: [id number]    Mfr: novaplus    Comments: 60 mg given    Patient tolerated injection without complications    Given by: Everitt Amber (June 06, 2009 4:39 PM)  Orders Added: 1)  Depo- Medrol 80mg  [J1040] 2)  Ketorolac-Toradol 15mg  [J1885] 3)  Admin of Therapeutic Inj  intramuscular or subcutaneous [96372]   Medication Administration  Injection # 1:    Medication: Depo- Medrol 80mg     Diagnosis: BACK PAIN (ICD-724.5)    Route: IM    Site: RUOQ gluteus    Exp Date: 04/2011    Lot #: [id number]    Mfr: Pharmacia    Comments: 80 mg given    Patient tolerated injection without complications    Given by: Everitt Amber (June 06, 2009 4:38 PM)  Injection # 2:    Medication: Ketorolac-Toradol 15mg     Diagnosis: BACK PAIN (ICD-724.5)    Route: IM    Site: LUOQ gluteus    Exp Date: 11/2010    Lot #: [id number]    Mfr: novaplus    Comments: 60 mg given    Patient tolerated injection without complications    Given by: Everitt Amber (June 06, 2009 4:39 PM)  Orders Added: 1)  Depo- Medrol  80mg  [J1040] 2)  Ketorolac-Toradol 15mg  [J1885] 3)  Admin of Therapeutic Inj  intramuscular or subcutaneous [[address]]

## 2010-11-25 NOTE — Medication Information (Signed)
Summary: Tax adviser   Imported By: Lind Guest 09/11/2008 08:56:50  _____________________________________________________________________  External Attachment:    Type:   Image     Comment:   External Document

## 2010-11-25 NOTE — Progress Notes (Signed)
  Phone Note Call from Patient   Summary of Call: [name] was here for the pain shots and just wanted to pass along that her BP was 160/110. I know it has been running high like that the past 2 office visits but I thought you should know. Initial call taken by: Everitt Amber,  June 06, 2009 4:44 PM     Appended Document:  pls advise she needs to stop thephentermine, her bp is too high,add new maxzide25 mg 1 daily and erx 330 refill one, nees ov in 2 to 3 weeks also pls let her know wnd have her schedule ov, thanks for the info  Appended Document:  Returned call, left msg  Appended Document:  Returned call, left message  Appended Document:  Mailed lettter to call office  Appended Document: refill    Clinical Lists Changes  Medications: Added new medication of MAXZIDE-25 37.5-25 MG TABS (TRIAMTERENE-HCTZ) Take 1 tablet by mouth once a day - Signed Rx of MAXZIDE-25 37.5-25 MG TABS (TRIAMTERENE-HCTZ) Take 1 tablet by mouth once a day;  #30 x 1;  Signed;  Entered by: Everitt Amber;  Authorized by: Syliva Overman MD;  Method used: Electronically to [facility]*, [address] [place], [place], [place]  [address], Ph: [phone], Fax: [phone]    Prescriptions: MAXZIDE-25 37.5-25 MG TABS (TRIAMTERENE-HCTZ) Take 1 tablet by mouth once a day  #30 x 1   Entered by:   Everitt Amber   Authorized by:   Syliva Overman MD   Signed by:   Everitt Amber on 06/12/2009   Method used:   Electronically to        [facility]* (retail)       [address] [place], [place]  [address]       Ph: [phone]       Fax: [phone]   RxID:   [id number]

## 2010-11-25 NOTE — Letter (Signed)
Summary: Letter  Letter   Imported By: Lind Guest 09/27/2009 14:36:19  _____________________________________________________________________  External Attachment:    Type:   Image     Comment:   External Document

## 2010-11-25 NOTE — Progress Notes (Signed)
Summary: rx  Phone Note Call from Patient   Summary of Call: pt states that someone got her xanax from [place] and now she needs a over ride on them  [phone] Initial call taken by: Rudene Anda,  Feb 24, 2010 4:52 PM  Follow-up for Phone Call        xanax refill only when dueunless there is a  police report, let her know pls Follow-up by: Syliva Overman MD,  Feb 24, 2010 5:05 PM  Additional Follow-up for Phone Call Additional follow up Details #1::        patient aware Additional Follow-up by: Adella Hare LPN,  Feb 26, 5283 8:09 AM

## 2010-11-25 NOTE — Progress Notes (Signed)
Summary: Pt can't afford meds  Phone Note Call from Patient   Caller: Patient Summary of Call: Pt called and said her suppositories and stool softner was going to be $33.00, and she doesn't have it til she gets her check on Tues. she wants to know if you can call in something cheaper, or can she use Prep H, please advise. Initial call taken by: Cloria Spring LPN,  November 22, 2009 12:51 PM     Appended Document: Pt can't afford meds Please call pt. She may use Prep H cream QID for 10 days, and if Sx persists continue for 10 more days.  Appended Document: Pt can't afford meds Pt informed.

## 2010-11-25 NOTE — Miscellaneous (Signed)
Summary: refill  Clinical Lists Changes  Medications: Rx of FUROSEMIDE 40 MG  TABS (FUROSEMIDE) one tab by mouth once daily;  #30 x 4;  Signed;  Entered by: Worthy Keeler LPN;  Authorized by: Syliva Overman MD;  Method used: Electronically to [facility]*, [address] [place], [place], [place]  [address], Ph: [phone], Fax: [phone]    Prescriptions: FUROSEMIDE 40 MG  TABS (FUROSEMIDE) one tab by mouth once daily  #30 x 4   Entered by:   Worthy Keeler LPN   Authorized by:   Syliva Overman MD   Signed by:   Worthy Keeler LPN on 13/05/6577   Method used:   Electronically to        [facility]* (retail)       [address] [place], [place]  [address]       Ph: [phone]       Fax: [phone]   RxID:   [id number]

## 2010-11-25 NOTE — Progress Notes (Signed)
  Phone Note From Pharmacy   Caller: [facility] Summary of Call: requesting refill on phentermine Initial call taken by: Adella Hare LPN,  Mar 11, 2010 4:47 PM  Follow-up for Phone Call        refill x 1 only needs june appt Follow-up by: Syliva Overman MD,  Mar 11, 2010 4:53 PM  Additional Follow-up for Phone Call Additional follow up Details #1::        rx sent, patient has appt this week Additional Follow-up by: Adella Hare LPN,  Mar 11, 2010 5:17 PM    Prescriptions: PHENTERMINE HCL 37.5 MG TABS (PHENTERMINE HCL) one tab by mouth once daily  #30 x 0   Entered by:   Adella Hare LPN   Authorized by:   Syliva Overman MD   Signed by:   Adella Hare LPN on 16/07/9603   Method used:   Printed then faxed to ...       [facility]* (retail)       [address]       [place], [place]  [address]       Ph: [phone]       Fax: [phone]   RxID:   [id number]

## 2010-11-25 NOTE — Letter (Signed)
Summary: Letter  Letter   Imported By: Rudene Anda 10/17/2009 10:55:27  _____________________________________________________________________  External Attachment:    Type:   Image     Comment:   External Document

## 2010-11-25 NOTE — Miscellaneous (Signed)
Summary: lab sheet  Clinical Lists Changes  Orders: Added new Test order of T-Basic Metabolic Panel ([id number]) - Signed Added new Test order of T-Hepatic Function ([id number]) - Signed Added new Test order of T-Lipid Profile ([id number]) - Signed 

## 2010-11-25 NOTE — Progress Notes (Signed)
Summary: RAIL  Phone Note Call from Patient   Summary of Call: NEEDS A HOSPITAL BED RAIL  [address] Initial call taken by: Lind Guest,  January 09, 2009 12:52 PM  Follow-up for Phone Call        pls erx this or call in on my behalf Follow-up by: Syliva Overman MD,  January 09, 2009 1:59 PM  Additional Follow-up for Phone Call Additional follow up Details #1::        Called into [place] Additional Follow-up by: Everitt Amber,  January 09, 2009 2:34 PM

## 2010-11-25 NOTE — Progress Notes (Signed)
Summary: DO NOT RELEASE VOC. REHAB  Phone Note Call from Patient   Summary of Call: DO NOT RELEASE ANY INFO TO [facility] NAMED BY KENNETH PETERKIN Initial call taken by: Lind Guest,  December 12, 2008 3:38 PM      Appended Document: DO NOT RELEASE VOC. REHAB pls verify with pt that she does not want this form completed and info to be sent. I received a signed statement from her last week and am confused  Appended Document: DO NOT RELEASE VOC. REHAB does not release anything to voc. rehab

## 2010-11-25 NOTE — Assessment & Plan Note (Signed)
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 She denies polyuria, polydypsia or blurred vision. today her blood pressure is elevated , and she denies alcohol or drug use, though I am still uncertain if I believe this She has had no recent fever or chills. She denies head or chest congestion, dysuria or frequency.    Allergies: 1)  ! * Mca  Review of Systems      See HPI General:  Denies chills and fever. ENT:  Denies earache, hoarseness, sinus pressure, and sore throat. CV:  Denies chest pain or discomfort, difficulty breathing while lying down, palpitations, and swelling of feet. Resp:  Denies cough and sputum productive. GI:  Denies abdominal pain, constipation, diarrhea, nausea, and vomiting. GU:  See HPI. MS:  Complains of joint pain. Derm:  Denies itching, lesion(s), and rash. Neuro:  Denies headaches and seizures. Psych:  Complains of anxiety, depression, and irritability; denies easily angered, easily tearful, suicidal thoughts/plans, thoughts of violence, and unusual visions or sounds. Endo:  See HPI. Heme:  Denies abnormal bruising and bleeding. Allergy:  Complains of seasonal  allergies.  Physical Exam  General:  alert, well-hydrated, and overweight-appearing.  HEENT: No facial asymmetry,  EOMI, No sinus tenderness, TM's Clear, oropharynx  pink and moist.   Chest: Clear to auscultation bilaterally.  CVS: S1, S2, No murmurs, No S3.   Abd: Soft, Nontender.  MS: Adequate ROM spine, hips, shoulders and knees.  Ext: No edema.   CNS: CN 2-12 intact, power tone and sensation normal throughout.   Skin: Intact, no visible lesions or rashes.  Psych: Good eye contact, normal affect.  Memory intact, not anxious or depressed appearing.     Impression & Recommendations:  Problem # 1:  DIABETES MELLITUS, TYPE II (ICD-250.00) Assessment Unchanged  Her updated medication list for this problem includes:    Aspirin 81 Mg Tbec (Aspirin) ..... One tab by mouth once daily    Lisinopril 20 Mg Tabs (Lisinopril) ..... One tab by mouth once daily  Labs Reviewed: Creat: 0.87 (02/14/2009)    Reviewed HgBA1c results: 5.8 (02/18/2009)  6.0 (11/07/2008)  Problem # 2:  HYPERTENSION (ICD-401.9) Assessment: Deteriorated  The following medications were removed from the medication list:    Norvasc 5 Mg Tabs (Amlodipine besylate) .Marland Kitchen... Take 1 tablet by mouth once a day Her updated medication list for this problem includes:    Verapamil Hcl Cr 180 Mg Tbcr (Verapamil hcl) .Marland Kitchen..Marland Kitchen Two tabs by mouth once daily  Furosemide 40 Mg Tabs (Furosemide) ..... One tab by mouth once daily    Lisinopril 20 Mg Tabs (Lisinopril) ..... One tab by mouth once daily    Hydralazine Hcl 50 Mg Tabs (Hydralazine hcl) ..... One tab by mouth tid    Norvasc 10 Mg Tabs (Amlodipine besylate) .Marland Kitchen... Take 1 tablet by mouth once a day  BP today: 170/110 Prior BP: 120/88 (02/18/2009)  Labs Reviewed: K+: 4.0 (02/14/2009) Creat: : 0.87 (02/14/2009)   Chol: 208 (02/14/2009)   HDL: 57 (02/14/2009)   LDL: 130 (02/14/2009)   TG: 103 (02/14/2009)  Problem # 3:  OBESITY (ICD-278.00) Assessment: Unchanged  Ht: 59  (04/11/2009)   Wt: 212 (04/11/2009)   BMI: 42.97 (04/11/2009)  Problem # 4:  HYPERLIPIDEMIA (ICD-272.4) Assessment: Deteriorated  Her updated medication list for this problem includes:    Simvastatin 80 Mg Tabs (Simvastatin) .Marland Kitchen... Take 1 tab by mouth at bedtime  Labs Reviewed: SGOT: 14 (07/18/2008)   SGPT: 14 (07/18/2008)   HDL:57 (02/14/2009), 40 (07/18/2008)  LDL:130 (02/14/2009), 75 (16/07/9603)  Chol:208 (02/14/2009), 140 (07/18/2008)  Trig:103 (02/14/2009), 127 (07/18/2008)  Complete Medication List: 1)  Ibuprofen 800 Mg Tabs (Ibuprofen) .... One tab by mouth once daily as needed 2)  Verapamil Hcl Cr 180 Mg Tbcr (Verapamil hcl) .... Two tabs by mouth once daily 3)  Furosemide 40 Mg Tabs (Furosemide) .... One tab by mouth once daily 4)  Alprazolam 0.5 Mg Tabs (Alprazolam) .... One tab by mouth three times a day as needed 5)  Fluticasone Propionate 50 Mcg/act Susp (Fluticasone propionate) .... Two puffs each nostril once daily 6)  Klor-con M20 20 Meq Tbcr (Potassium chloride crys cr) .... One tab by mouth two times a day 7)  Patanol 0.1 % Soln (Olopatadine hcl) .... Uad 8)  Aspirin 81 Mg Tbec (Aspirin) .... One tab by mouth once daily 9)  Lisinopril 20 Mg Tabs (Lisinopril) .... One tab by mouth once daily 10)  Carisoprodol 350 Mg Tabs (Carisoprodol) .... Take one tab by mouth once daily 11)  Omeprazole 20 Mg Cpdr (Omeprazole) .... Take one cap by mouth once daily 12)  Hydroxyzine Hcl 25 Mg Tabs (Hydroxyzine hcl) .... Take one tab by mouth at bedtime 13)  Phentermine Hcl 37.5 Mg Caps (Phentermine hcl) .... Take 1 capsule by mouth once a day 14)  Cetirizine Hcl 10 Mg Tabs (Cetirizine hcl) .... One tab by mouth once daily 15)  Singulair 10 Mg Tabs (Montelukast sodium) .... Take 1 tablet by mouth once a day 16)  Astelin 137 Mcg/spray Soln (Azelastine hcl) .... 2 puffs per nostril twice daily 17)  Ibuprofen 800 Mg Tabs (Ibuprofen) .... One tab by mouth once daily prn 18)  Hydralazine Hcl  50 Mg Tabs (Hydralazine hcl) .... One tab by mouth tid 19)  Simvastatin 80 Mg Tabs (Simvastatin) .... Take 1 tab by mouth at bedtime 20)  Phentermine Hcl 37.5 Mg Tabs (Phentermine hcl) .... Take 1 tablet by mouth once a day 21)  Norvasc 10 Mg Tabs (Amlodipine besylate) .... Take 1 tablet by mouth once a day  Other Orders: T-TSH ([id number])  Patient Instructions: 1)  cPE and pap in 4 weeks. 2)  The dose of norvasc is increased to 10mg  , your bp is too high. 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 4)  It is important that you exercise regularly at least 20 minutes 5 times a week. If you develop chest pain, have severe difficulty breathing,  or feel very tired , stop exercising immediately and seek medical attention. 5)  You need to lose weight. Consider a lower calorie diet and regular exercise.  Prescriptions: NORVASC 10 MG TABS (AMLODIPINE BESYLATE) Take 1 tablet by mouth once a day  #30 x 2   Entered and Authorized by:   Syliva Overman MD   Signed by:   Syliva Overman MD on 04/11/2009   Method used:   Electronically to        [facility]* (retail)       [address]       [place], [place]  [address]       Ph: [phone]       Fax: [phone]   RxID:   [id number]

## 2010-11-25 NOTE — Miscellaneous (Signed)
Summary: Home Care Report  Home Care Report   Imported By: Lind Guest 12/31/2008 08:15:51  _____________________________________________________________________  External Attachment:    Type:   Image     Comment:   External Document

## 2010-11-25 NOTE — Miscellaneous (Signed)
Summary: Refill  Clinical Lists Changes  Medications: Rx of LISINOPRIL 20 MG  TABS (LISINOPRIL) one tab by mouth once daily;  #30 x 5;  Signed;  Entered by: Everitt Amber;  Authorized by: Syliva Overman MD;  Method used: Electronically to [facility]*, [address] [place], [place], [place]  [address], Ph: [phone], Fax: [phone] Rx of IBUPROFEN 800 MG TABS (IBUPROFEN) one tab by mouth once daily prn;  #30 x 5;  Signed;  Entered by: Everitt Amber;  Authorized by: Syliva Overman MD;  Method used: Electronically to [facility]*, [address] [place], [place], [place]  [address], Ph: [phone], Fax: [phone] Rx of OMEPRAZOLE 20 MG  CPDR (OMEPRAZOLE) take one cap by mouth once daily;  #30 x 5;  Signed;  Entered by: Everitt Amber;  Authorized by: Syliva Overman MD;  Method used: Electronically to [facility]*, [address], [place], [place], [place]  [address], Ph: [phone], Fax: [phone] Rx of KLOR-CON M20 20 MEQ  TBCR (POTASSIUM CHLORIDE CRYS CR) one tab by mouth two times a day;  #60 x 5;  Signed;  Entered by: Everitt Amber;  Authorized by: Syliva Overman MD;  Method used: Electronically to [facility]*, [address] [place], [place], [place]  [address], Ph: [phone], Fax: [phone] Rx of ALPRAZOLAM 0.5 MG  TABS (ALPRAZOLAM) one tab by mouth three times a day as needed;  #90 x 4;  Signed;  Entered by: Everitt Amber;  Authorized by: Syliva Overman MD;  Method used: Printed then faxed to [facility]*, [name] [place], [place], [place]  [address], Ph: [phone], Fax: [phone] Rx of PATANOL 0.1 %  SOLN (OLOPATADINE HCL) UAD;  #1 x 3;  Signed;  Entered by: Everitt Amber;  Authorized by: Syliva Overman MD;  Method used: Electronically to [facility]*, [address], [place], [place], [place]  [address], Ph: [phone],  Fax: [phone]    Prescriptions: PATANOL 0.1 %  SOLN (OLOPATADINE HCL) UAD  #1 x 3   Entered by:   Everitt Amber   Authorized by:   Syliva Overman MD   Signed by:   Everitt Amber on 12/07/2008   Method used:   Electronically to        [facility]* (retail)       [address] [place], [place]  [address]       Ph: [phone]       Fax: [phone]   RxID:   [id number] ALPRAZOLAM 0.5 MG  TABS (ALPRAZOLAM) one tab by mouth three times a day as needed  #90 x 4   Entered by:   Everitt Amber   Authorized by:   Syliva Overman MD   Signed by:   Everitt Amber on 12/07/2008   Method used:   Printed then faxed to ...       [facility]* (retail)       [address]       [place], [place]  [address]       Ph: [phone]       Fax: [phone]   RxID:   [id number] KLOR-CON M20 20 MEQ  TBCR (POTASSIUM CHLORIDE CRYS CR) one tab by mouth two times a day  #60 x 5   Entered by:   Everitt Amber   Authorized by:  Syliva Overman MD   Signed by:   Everitt Amber on 12/07/2008   Method used:   Electronically to        [facility]* (retail)       [address] [place], [place]  [address]       Ph: [phone]       Fax: [phone]   RxID:   [id number] OMEPRAZOLE 20 MG  CPDR (OMEPRAZOLE) take one cap by mouth once daily  #30 x 5   Entered by:   Everitt Amber   Authorized by:   Syliva Overman MD   Signed by:   Everitt Amber on 12/07/2008   Method used:   Electronically to        [facility]* (retail)       [address] [place], [place]  [address]       Ph: [phone]       Fax: [phone]   RxID:   [id number] IBUPROFEN 800 MG TABS (IBUPROFEN) one tab by mouth once daily prn  #30 x 5   Entered by:   Everitt Amber   Authorized by:   Syliva Overman MD   Signed by:   Everitt Amber on  12/07/2008   Method used:   Electronically to        [facility] (retail)       [address] [place], [place]  [address]       Ph: [phone]       Fax: [phone]   RxID:   [id number] LISINOPRIL 20 MG  TABS (LISINOPRIL) one tab by mouth once daily  #30 x 5   Entered by:   Everitt Amber   Authorized by:   Syliva Overman MD   Signed by:   Everitt Amber on 12/07/2008   Method used:   Electronically to        [facility] (retail)       [address]       [place], [place]  [address]       Ph: [phone]       Fax: [phone]   RxID:   [id number]

## 2010-11-25 NOTE — Letter (Signed)
Summary: [facility] No Show  [facility]  [address]  [place], [place] [address]   Phone: [phone]  Fax: [phone]    02/21/10  Dr. [facility] Ph [phone] / Fax [phone]   Re:    [name] DOB:    1965-11-02    The above named patient was a no show for the scheduled appointment:  Date:  02/17/10 Time:  2:00PM  Thank you for your kind referral.  Please feel free to contact our office if we can be of further service.  Sincerely,   [facility] of Dr. Terrance Mass, MD

## 2010-11-25 NOTE — Miscellaneous (Signed)
Summary: Orders Update  Clinical Lists Changes  Orders: Added new Test order of T-Basic Metabolic Panel [id number]) - Signed Added new Test order of T-Hepatic Function [phone]) - Signed Added new Test order of T-Lipid Profile [phone]) - Signed

## 2010-11-25 NOTE — Progress Notes (Signed)
Summary: back is hurting  Phone Note Call from Patient   Summary of Call: back is hurting more than has been. [phone] Initial call taken by: Rudene Anda,  October 17, 2008 1:38 PM  Follow-up for Phone Call        ,Her back has been hurting worse since the weather got colder. She is on Ibuprofen already but its not doing any good. Her pain is severe.  Follow-up by: Everitt Amber,  October 22, 2008 11:45 AM  Additional Follow-up for Phone Call Additional follow up Details #1::        advise and offer toradol 60mg  iM and Depomedrol 80 mg iM  today as nurse visit and rx for prednisone 5mg  dose pack x 6 days only Additional Follow-up by: Syliva Overman MD,  October 22, 2008 2:21 PM    Additional Follow-up for Phone Call Additional follow up Details #2::    Patient going to see if she can get a ride to come gets shots today  Follow-up by: Everitt Amber,  October 22, 2008 3:26 PM    Appended Document: back is hurting    Clinical Lists Changes  Medications: Added new medication of PREDNISONE (PAK) 5 MG TABS (PREDNISONE) UAD - Signed Rx of PREDNISONE (PAK) 5 MG TABS (PREDNISONE) UAD;  #1 pk x 0;  Signed;  Entered by: Everitt Amber;  Authorized by: Syliva Overman MD;  Method used: Electronically to [facility]*, [address] [place], [place], [place]  [address], Ph: [phone], Fax: [phone]    Prescriptions: PREDNISONE (PAK) 5 MG TABS (PREDNISONE) UAD  #1 pk x 0   Entered by:   Everitt Amber   Authorized by:   Syliva Overman MD   Signed by:   Everitt Amber on 10/22/2008   Method used:   Electronically to        [facility] (retail)       [address]       [place], [place]  [address]       Ph: [phone]       Fax: [phone]   RxID:   [id number]

## 2010-11-25 NOTE — Progress Notes (Signed)
Summary: DIET PILLS  Phone Note Call from Patient   Summary of Call: LOOKING ON PAPER DR HAS TOOK HER OFF LEXAPRO AND WELLBUTRIN . DRUG STORE DID NOT RECEIVE ANYTHING ON HER DIET PILLS THEY NEED TO BE FAXED NOT SENT ON COMPUTER CALL HER BACK @ [phone] ABOUT HER DIET PILLS Initial call taken by: Lind Guest,  June 07, 2008 4:14 PM  Follow-up for Phone Call        Prescription will be faxed in am.      Prescriptions: PHENTERMINE HCL 37.5 MG  TABS (PHENTERMINE HCL) take one tab by mouth once daily  #42 x 1   Entered and Authorized by:   Syliva Overman MD   Signed by:   Syliva Overman MD on 06/07/2008   Method used:   Print then Give to Patient   RxID:   [id number]

## 2010-11-25 NOTE — Progress Notes (Signed)
Summary: initial office visit  initial office visit   Imported By: Jacklynn Ganong 08/16/2008 14:17:34  _____________________________________________________________________  External Attachment:    Type:   Image     Comment:   initial evaluation

## 2010-11-26 ENCOUNTER — Encounter: Payer: Self-pay | Admitting: Family Medicine

## 2010-11-27 NOTE — Progress Notes (Signed)
Summary: # to call with results   Phone Note Call from Patient   Summary of Call: please call her at [phone] with results from lab and pap smear results.  She states you can leave a msg if she doesn't answer. Initial call taken by: Curtis Sites,  October 24, 2010 10:36 AM  Follow-up for Phone Call        noted Follow-up by: Adella Hare LPN,  October 24, 2010 11:20 AM

## 2010-11-27 NOTE — Letter (Signed)
Summary: Pap Smear, Normal Letter, [facility]  [address]   [place], [place] [address]   Phone: [phone]  Fax: [phone]          November 03, 2010    Dear: Cathlean Marseilles    I am pleased to notify you that your PAP smear was normal.  You will need your next PAP smear in:     ____ 3 Months    ____ 6 Months    ____ 12 Months    Please call the office at our office number above, to schedule your next appointment.    Sincerely,     [facility]

## 2010-11-27 NOTE — Letter (Signed)
Summary: d/c medication  d/c medication   Imported By: Lind Guest 10/28/2010 13:55:53  _____________________________________________________________________  External Attachment:    Type:   Image     Comment:   External Document

## 2010-11-27 NOTE — Assessment & Plan Note (Signed)
Summary: PHY   Vital Signs:  Patient profile:   45 year old female Menstrual status:  regular Height:      59 inches Weight:      247.50 pounds BMI:     50.17 O2 Sat:      97 % on Room air Pulse rate:   78 / minute Pulse rhythm:   regular Resp:     16 per minute BP sitting:   140 / 100  (left arm)  Vitals Entered By: Adella Hare LPN (October 23, 2010 2:25 PM)  Nutrition Counseling: Patient's BMI is greater than 25 and therefore counseled on weight management options.  O2 Flow:  Room air CC: physical Is Patient Diabetic? Yes Comments did not bring meds to ov  Vision Screening:Left eye w/o correction: 20 / 50 Right Eye w/o correction: 20 / 200 Both eyes w/o correction:  20/ 50        Vision Entered By: Adella Hare LPN (October 23, 2010 2:26 PM)   Primary Care Provider:  Syliva Overman MD  CC:  physical.  History of Present Illness: Reports  that she has been doing well in terms of drug and alcohol use. She reports however that she smokes on avg 1PPd and is eating excessively with no regard to being diabetic. she will soon be livin on her own, is now staying with her daughter and is babysitting. States she has been out oif her meds for 4 days, "left them at herson's house " Denies recent fever or chills. Denies sinus pressure, nasal congestion , ear pain or sore throat. Denies chest congestion, or cough productive of sputum. Denies chest pain, palpitations, PND, orthopnea or leg swelling. Denies abdominal pain, nausea, vomitting, diarrhea or constipation. Denies change in bowel movements or bloody stool. Denies dysuria , frequency, incontinence or hesitancy. Denies  joint pain, swelling, or reduced mobility. Denies headaches, vertigo, seizures. Denies depression, anxiety or insomnia. Denies  rash, lesions, or itch.     Preventive Screening-Counseling & Management  Alcohol-Tobacco     Smoking Cessation Counseling: yes  Allergies (verified): 1)  ! *  Mca  Review of Systems      See HPI General:  Complains of fatigue. Eyes:  Complains of vision loss-both eyes; denies discharge, eye irritation, and eye pain; 'lost prescription glasses, flushed down commode". MS:  Complains of low back pain and mid back pain; back spasm. Psych:  Complains of anxiety, depression, and mental problems; denies suicidal thoughts/plans, thoughts of violence, and unusual visions or sounds; controlled on meds. Endo:  Denies excessive hunger, excessive thirst, and excessive urination; tests once daily fasting reportedly between 94 to  96. Heme:  Denies abnormal bruising, bleeding, enlarge lymph nodes, and fevers. Allergy:  Denies hives or rash and itching eyes.  Physical Exam  General:  Well-developed,obese,in no acute distress; alert,appropriate and cooperative throughout examination Head:  Normocephalic and atraumatic without obvious abnormalities. No apparent alopecia or balding. Eyes:  pupils equal, pupils round, and pupils reactive to light.   Ears:  External ear exam shows no significant lesions or deformities.  Otoscopic examination reveals clear canals, tympanic membranes are intact bilaterally without bulging, retraction, inflammation or discharge. Hearing is grossly normal bilaterally. Nose:  External nasal examination shows no deformity or inflammation. Nasal mucosa are pink and moist without lesions or exudates. Mouth:  pharynx pink and moist.   Neck:  No deformities, masses, or tenderness noted. Chest Wall:  No deformities, masses, or tenderness noted. Breasts:  No mass, nodules, thickening,  tenderness, bulging, retraction, inflamation, nipple discharge or skin changes noted.   Lungs:  Normal respiratory effort, chest expands symmetrically. Lungs are clear to auscultation, no crackles or wheezes.decreased air entry bilaterally Heart:  Normal rate and regular rhythm. S1 and S2 normal without gallop, murmur, click, rub or other extra sounds. Abdomen:   Bowel sounds positive,abdomen soft, obese and non-tender without masses, organomegaly or hernias noted. Rectal:  deferred due to pt request, she will submit stool cards Genitalia:  Normal introitus for age, no external lesions, no vaginal discharge, mucosa pink and moist, no vaginal or cervical lesions, no vaginal atrophy, no friaility or hemorrhage, normal uterus size and position, no adnexal masses or tenderness Msk:  No deformity or scoliosis noted of thoracic or lumbar spine.   Pulses:  R and L carotid,radial,femoral,dorsalis pedis and posterior tibial pulses are full and equal bilaterally Extremities:  No clubbing, cyanosis, edema, or deformity noted with normal full range of motion of all joints.   Neurologic:  No cranial nerve deficits noted. Station and gait are normal. Plantar reflexes are down-going bilaterally. DTRs are symmetrical throughout. Sensory, motor and coordinative functions appear intact. Skin:  Intact without suspicious lesions or rashes Cervical Nodes:  No lymphadenopathy noted Axillary Nodes:  No palpable lymphadenopathy Inguinal Nodes:  No significant adenopathy Psych:  Cognition and judgment appear intact. Alert and cooperative with normal attention span and concentration. No apparent delusions, illusions, hallucinations  Diabetes Management Exam:    Foot Exam (with socks and/or shoes not present):       Sensory-Monofilament:          Left foot: diminished          Right foot: diminished       Inspection:          Left foot: abnormal             Comments: callous          Right foot: abnormal             Comments: callous       Nails:          Left foot: thickened          Right foot: thickened   Impression & Recommendations:  Problem # 1:  ANXIETY (ICD-300.00) Assessment Improved  Her updated medication list for this problem includes:    Alprazolam 0.5 Mg Tabs (Alprazolam) ..... One tab by mouth three times a day as needed    Hydroxyzine Hcl 50 Mg Tabs  (Hydroxyzine hcl) .Marland Kitchen... Take 1 at bedtime    Paroxetine Hcl 20 Mg Tabs (Paroxetine hcl) ..... One tab by mouth once daily  Problem # 2:  DEPRESSION (ICD-311) Assessment: Improved  Her updated medication list for this problem includes:    Alprazolam 0.5 Mg Tabs (Alprazolam) ..... One tab by mouth three times a day as needed    Hydroxyzine Hcl 50 Mg Tabs (Hydroxyzine hcl) .Marland Kitchen... Take 1 at bedtime    Paroxetine Hcl 20 Mg Tabs (Paroxetine hcl) ..... One tab by mouth once daily  Problem # 3:  DIABETES MELLITUS, TYPE II (ICD-250.00) Assessment: Deteriorated  Her updated medication list for this problem includes:    Aspirin 81 Mg Tbec (Aspirin) ..... One tab by mouth once daily    Lisinopril 40 Mg Tabs (Lisinopril) .Marland Kitchen... Take 1 tablet by mouth once a day    Aspirin 325 Mg Tabs (Aspirin) ..... Once daily    Metformin Hcl 1000 Mg Tabs (Metformin hcl) .Marland Kitchen... Take 1  tablet by mouth two times a day    Glipizide 5 Mg Xr24h-tab (Glipizide) .Marland Kitchen... Take 1 tablet by mouth every morning  Orders: T- Hemoglobin A1C (04540-98119) T-Urine Microalbumin w/creat. ratio [phone]) T- Hemoglobin A1C (57846-96295) Ophthalmology Referral (Ophthalmology)  Labs Reviewed: Creat: 0.79 (07/17/2010)    Reviewed HgBA1c results: 10.3 (07/17/2010)  6.7 (01/07/2010)  Problem # 4:  HYPERTENSION (ICD-401.9) Assessment: Comment Only  Her updated medication list for this problem includes:    Furosemide 40 Mg Tabs (Furosemide) ..... One tab by mouth once daily    Lisinopril 40 Mg Tabs (Lisinopril) .Marland Kitchen... Take 1 tablet by mouth once a day    Maxzide 75-50 Mg Tabs (Triamterene-hctz) .Marland Kitchen... Take 1 tablet by mouth once a day    Amlodipine Besylate 10 Mg Tabs (Amlodipine besylate) .Marland Kitchen... Take 1 tablet by mouth once a day    Clonidine Hcl 0.3 Mg Tabs (Clonidine hcl) .Marland Kitchen... Take 1 tab by mouth at bedtime  Orders: T-CMP with estimated GFR ([address])  BP today: 140/100, reports noncompliance with meds, impt of same  stressed Prior BP: 150/108 (07/17/2010)  Labs Reviewed: K+: 3.8 (07/17/2010) Creat: : 0.79 (07/17/2010)   Chol: 239 (07/17/2010)   HDL: 45 (07/17/2010)   LDL: 114 (07/17/2010)   TG: 400 (07/17/2010)  Problem # 5:  HYPERLIPIDEMIA (ICD-272.4) Assessment: Comment Only  Her updated medication list for this problem includes:    Lovastatin 40 Mg Tabs (Lovastatin) .Marland Kitchen... 2 tablets at bedtime Low fat dietdiscussed and encouraged  Orders: T-Lipid Profile (10272-53664)  Labs Reviewed: SGOT: 17 (07/17/2010)   SGPT: 15 (07/17/2010)   HDL:45 (07/17/2010), 49 (01/07/2010)  LDL:114 (07/17/2010), See Comment mg/dL (40/34/7425)  ZDGL:875 (07/17/2010), 230 (01/07/2010)  Trig:400 (07/17/2010), 460 (01/07/2010)  Complete Medication List: 1)  Furosemide 40 Mg Tabs (Furosemide) .... One tab by mouth once daily 2)  Alprazolam 0.5 Mg Tabs (Alprazolam) .... One tab by mouth three times a day as needed 3)  Fluticasone Propionate 50 Mcg/act Susp (Fluticasone propionate) .... Two puffs each nostril once daily 4)  Klor-con M20 20 Meq Tbcr (Potassium chloride crys cr) .... One tab by mouth two times a day 5)  Aspirin 81 Mg Tbec (Aspirin) .... One tab by mouth once daily 6)  Omeprazole 20 Mg Cpdr (Omeprazole) .... Take one cap by mouth once daily 7)  Hydroxyzine Hcl 50 Mg Tabs (Hydroxyzine hcl) .... Take 1 at bedtime 8)  Singulair 10 Mg Tabs (Montelukast sodium) .... Take 1 tablet by mouth once a day 9)  Astelin 137 Mcg/spray Soln (Azelastine hcl) .... 2 puffs per nostril twice daily 10)  Ibuprofen 800 Mg Tabs (Ibuprofen) .... One tab by mouth once daily prn 11)  Tramadol Hcl 50 Mg Tabs (Tramadol hcl) .... One tab by mouth  twice daily as needed 12)  Lisinopril 40 Mg Tabs (Lisinopril) .... Take 1 tablet by mouth once a day 13)  Maxzide 75-50 Mg Tabs (Triamterene-hctz) .... Take 1 tablet by mouth once a day 14)  Aspirin 325 Mg Tabs (Aspirin) .... Once daily 15)  Zyrtec Allergy 10 Mg Tabs (Cetirizine hcl) ....  Once daily 16)  Amlodipine Besylate 10 Mg Tabs (Amlodipine besylate) .... Take 1 tablet by mouth once a day 17)  Paroxetine Hcl 20 Mg Tabs (Paroxetine hcl) .... One tab by mouth once daily 18)  Clonidine Hcl 0.3 Mg Tabs (Clonidine hcl) .... Take 1 tab by mouth at bedtime 19)  Lovastatin 40 Mg Tabs (Lovastatin) .... 2 tablets at bedtime 20)  Metformin Hcl 1000 Mg Tabs (Metformin  hcl) .... Take 1 tablet by mouth two times a day 21)  Glipizide 5 Mg Xr24h-tab (Glipizide) .... Take 1 tablet by mouth every morning 22)  Cyclobenzaprine Hcl 10 Mg Tabs (Cyclobenzaprine hcl) .... Take 1 tab by mouth at bedtime  Other Orders: Medicare Electronic Prescription [id number]) Pap Smear (60454) Radiology Referral (Radiology)  Patient Instructions: 1)  Please schedule a follow-up appointment in 3 months. 2)  Tobacco is very bad for your health and your loved ones! You Should stop smoking!. 3)  Stop Smoking Tips: Choose a Quit date. Cut down before the Quit date. decide what you will do as a substitute when you feel the urge to smoke(gum,toothpick,exercise). 4)  It is important that you exercise regularly at least 30 minutes 5 times a week. If you develop chest pain, have severe difficulty breathing, or feel very tired , stop exercising immediately and seek medical attention. 5)  You need to lose weight. Consider a lower calorie diet and regular exercise.  6)  HbgA1C prior to visit, ICD-9:  today 7)  microalb to be sent today 8)  We will schedule your mammogram. 9)  New med as discussed for back spasm 10)  bMP and egfr , fasting lipid , HBa1C in 3 months Prescriptions: CYCLOBENZAPRINE HCL 10 MG TABS (CYCLOBENZAPRINE HCL) Take 1 tab by mouth at bedtime  #30 x 3   Entered and Authorized by:   Syliva Overman MD   Signed by:   Syliva Overman MD on 10/23/2010   Method used:   Printed then faxed to ...       [facility]* (retail)       [address]       [place],  [place]  [address]       Ph: [phone]       Fax: [phone]   RxID:   [id number]    Orders Added: 1)  Est. Patient 40-64 years [99396] 2)  Medicare Electronic Prescription [G8553] 3)  T-CMP with estimated GFR [[address]] 4)  T-Lipid Profile [80061-22930] 5)  T- Hemoglobin A1C [83036-23375] 6)  T-Urine Microalbumin w/creat. ratio [82043-[address]] 7)  T- Hemoglobin A1C [83036-23375] 8)  Pap Smear [88150] 9)  Ophthalmology Referral [Ophthalmology] 10)  Radiology Referral [Radiology]

## 2010-11-27 NOTE — Progress Notes (Signed)
Summary: meter  Phone Note Call from Patient   Summary of Call: forgot to tell you that she needs a meter sent to [facility] Initial call taken by: Lind Guest,  October 28, 2010 1:04 PM    New/Updated Medications: ACCU-CHEK AVIVA  KIT (BLOOD GLUCOSE MONITORING SUPPL) once daily testing dx: 250.00 ACCU-CHEK AVIVA  STRP (GLUCOSE BLOOD) once daily testing  dx: 250.00 ACCU-CHEK MULTICLIX LANCETS  MISC (LANCETS) once daily testing dx: 250.00 Prescriptions: ACCU-CHEK MULTICLIX LANCETS  MISC (LANCETS) once daily testing dx: 250.00  #100 x 1   Entered by:   Adella Hare LPN   Authorized by:   Syliva Overman MD   Signed by:   Adella Hare LPN on 16/07/9603   Method used:   Electronically to        [facility]* (retail)       [address] [place], [place]  [address]       Ph: [phone]       Fax: [phone]   RxID:   [id number] ACCU-CHEK AVIVA  STRP (GLUCOSE BLOOD) once daily testing  dx: 250.00  #100 x 1   Entered by:   Adella Hare LPN   Authorized by:   Syliva Overman MD   Signed by:   Adella Hare LPN on 08/22/2535   Method used:   Electronically to        [facility]* (retail)       [address] [place], [place]  [address]       Ph: [phone]       Fax: [phone]   RxID:   [id number] ACCU-CHEK AVIVA  KIT (BLOOD GLUCOSE MONITORING SUPPL) once daily testing dx: 250.00  #100 x 1   Entered by:   Adella Hare LPN   Authorized by:   Syliva Overman MD   Signed by:   Adella Hare LPN on 32/35/5732   Method used:   Electronically to        [facility]* (retail)       [address] [place], [place]  [address]       Ph: [phone]       Fax: [phone]   RxID:   [id number]

## 2010-11-27 NOTE — Miscellaneous (Signed)
  Clinical Lists Changes  Medications: Removed medication of GLIPIZIDE 5 MG XR24H-TAB (GLIPIZIDE) Take 1 tablet by mouth every morning Added new medication of GLIPIZIDE 10 MG XR24H-TAB (GLIPIZIDE) Take 1 tablet by mouth two times a day - Signed Rx of GLIPIZIDE 10 MG XR24H-TAB (GLIPIZIDE) Take 1 tablet by mouth two times a day;  #60 x 3;  Signed;  Entered by: Syliva Overman MD;  Authorized by: Syliva Overman MD;  Method used: Historical    Prescriptions: GLIPIZIDE 10 MG XR24H-TAB (GLIPIZIDE) Take 1 tablet by mouth two times a day  #60 x 3   Entered and Authorized by:   Syliva Overman MD   Signed by:   Syliva Overman MD on 10/24/2010   Method used:   Historical   RxID:   [id number]

## 2010-11-27 NOTE — Progress Notes (Signed)
Summary: returning call  Phone Note Call from Patient   Summary of Call: patient returning your cal please call back @l  [phone] at daughter house Initial call taken by: [name] Hoes,  November 04, 2010 9:23 AM  Follow-up for Phone Call        Pt aware pap normal Follow-up by: Everitt Amber LPN,  November 04, 2010 9:35 AM

## 2010-11-27 NOTE — Progress Notes (Signed)
Summary: return call  Phone Note Call from Patient   Summary of Call: pt was returning brandi call. she states call her at daughters house [phone] Initial call taken by: Rudene Anda,  November 03, 2010 11:57 AM  Follow-up for Phone Call        was calling to notify patient of normal pap. Called number provided and no answer Follow-up by: Everitt Amber LPN,  November 03, 2010 2:03 PM  Additional Follow-up for Phone Call Additional follow up Details #1::        Patient aware Additional Follow-up by: Everitt Amber LPN,  November 04, 2010 9:36 AM

## 2010-11-28 NOTE — Miscellaneous (Signed)
Summary: Home Care Report  Home Care Report   Imported By: Lind Guest 01/08/2010 08:20:57  _____________________________________________________________________  External Attachment:    Type:   Image     Comment:   External Document

## 2010-12-05 ENCOUNTER — Telehealth: Payer: Self-pay | Admitting: Family Medicine

## 2010-12-17 NOTE — Progress Notes (Signed)
Summary: medicine  Phone Note Call from Patient   Summary of Call: pt needs medicine faxed into pharm now. [phone] Initial call taken by: Rudene Anda,  December 05, 2010 1:31 PM  Follow-up for Phone Call        returned call, busy signal Follow-up by: Adella Hare LPN,  December 05, 2010 1:54 PM  Additional Follow-up for Phone Call Additional follow up Details #1::        returned call, busy signal Additional Follow-up by: Adella Hare LPN,  December 08, 2010 8:47 AM

## 2010-12-18 ENCOUNTER — Telehealth: Payer: Self-pay | Admitting: Family Medicine

## 2010-12-23 ENCOUNTER — Encounter: Payer: Self-pay | Admitting: Family Medicine

## 2010-12-23 NOTE — Progress Notes (Signed)
  Phone Note Call from Patient   Caller: Patient Summary of Call: patient complains of toe pain, weak and tired, sugar was up to 586 last night, 280 today patient wants to know what to do Initial call taken by: Adella Hare LPN,  December 18, 2010 9:53 AM  Follow-up for Phone Call        made appt with dietitian for monday but wants to come in asap. Sugar today has been 280 and 306. Advised her to try to get in earlier and in the meantime if she sugar gets over 450 she needs to go to the ER.  Follow-up by: Everitt Amber LPN,  December 18, 2010 3:53 PM

## 2010-12-23 NOTE — Progress Notes (Signed)
Summary: sick  Phone Note Call from Patient   Summary of Call: pt is a diabetic and her sugar last nite was 584 and this morning 280. before eating anything. [phone] Initial call taken by: Rudene Anda,  December 18, 2010 9:36 AM  Follow-up for Phone Call        this is a duplicate message Follow-up by: Adella Hare LPN,  December 18, 2010 10:17 AM

## 2010-12-24 ENCOUNTER — Encounter: Payer: Self-pay | Admitting: Family Medicine

## 2010-12-26 ENCOUNTER — Encounter: Payer: Self-pay | Admitting: Family Medicine

## 2011-01-01 NOTE — Letter (Signed)
Summary: External Correspondence  External Correspondence   Imported By: Lind Guest 12/23/2010 14:00:25  _____________________________________________________________________  External Attachment:    Type:   Image     Comment:   External Document

## 2011-01-01 NOTE — Letter (Signed)
Summary: returned  returned   Imported By: Lind Guest 12/23/2010 08:21:43  _____________________________________________________________________  External Attachment:    Type:   Image     Comment:   External Document

## 2011-01-01 NOTE — Letter (Signed)
[facility]: [facility]   Imported By: Lind Guest 12/25/2010 11:19:41  _____________________________________________________________________  External Attachment:    Type:   Image     Comment:   External Document

## 2011-01-01 NOTE — Letter (Signed)
Summary: diabetic supplies  diabetic supplies   Imported By: Lind Guest 12/26/2010 11:51:53  _____________________________________________________________________  External Attachment:    Type:   Image     Comment:   External Document

## 2011-01-06 ENCOUNTER — Ambulatory Visit (INDEPENDENT_AMBULATORY_CARE_PROVIDER_SITE_OTHER): Payer: Medicare Other | Admitting: Family Medicine

## 2011-01-06 ENCOUNTER — Encounter: Payer: Self-pay | Admitting: Family Medicine

## 2011-01-06 DIAGNOSIS — E669 Obesity, unspecified: Secondary | ICD-10-CM

## 2011-01-06 DIAGNOSIS — E119 Type 2 diabetes mellitus without complications: Secondary | ICD-10-CM

## 2011-01-06 DIAGNOSIS — I10 Essential (primary) hypertension: Secondary | ICD-10-CM

## 2011-01-06 DIAGNOSIS — E785 Hyperlipidemia, unspecified: Secondary | ICD-10-CM

## 2011-01-06 LAB — CONVERTED CEMR LAB: Glucose, Bld: 210 mg/dL

## 2011-01-11 DIAGNOSIS — F17200 Nicotine dependence, unspecified, uncomplicated: Secondary | ICD-10-CM | POA: Insufficient documentation

## 2011-01-12 LAB — GLUCOSE, CAPILLARY: Glucose-Capillary: 234 mg/dL — ABNORMAL HIGH (ref 70–99)

## 2011-01-20 ENCOUNTER — Telehealth: Payer: Self-pay | Admitting: Family Medicine

## 2011-01-20 ENCOUNTER — Ambulatory Visit (INDEPENDENT_AMBULATORY_CARE_PROVIDER_SITE_OTHER): Payer: Medicare Other | Admitting: Family Medicine

## 2011-01-20 VITALS — BP 140/90 | HR 105 | Resp 16 | Ht 60.5 in | Wt 244.1 lb

## 2011-01-20 DIAGNOSIS — E119 Type 2 diabetes mellitus without complications: Secondary | ICD-10-CM

## 2011-01-20 DIAGNOSIS — E669 Obesity, unspecified: Secondary | ICD-10-CM

## 2011-01-20 DIAGNOSIS — L0291 Cutaneous abscess, unspecified: Secondary | ICD-10-CM

## 2011-01-20 DIAGNOSIS — E1169 Type 2 diabetes mellitus with other specified complication: Secondary | ICD-10-CM

## 2011-01-20 DIAGNOSIS — L0390 Cellulitis, unspecified: Secondary | ICD-10-CM

## 2011-01-20 LAB — CBC WITH DIFFERENTIAL/PLATELET
Basophils Absolute: 0.1 10*3/uL (ref 0.0–0.1)
Basophils Relative: 1 % (ref 0–1)
Eosinophils Absolute: 0.2 10*3/uL (ref 0.0–0.7)
Eosinophils Relative: 3 % (ref 0–5)
HCT: 44.4 % (ref 36.0–46.0)
Hemoglobin: 14.8 g/dL (ref 12.0–15.0)
Lymphocytes Relative: 27 % (ref 12–46)
Lymphs Abs: 1.9 10*3/uL (ref 0.7–4.0)
MCH: 31.4 pg (ref 26.0–34.0)
MCHC: 33.3 g/dL (ref 30.0–36.0)
MCV: 94.1 fL (ref 78.0–100.0)
Monocytes Absolute: 0.3 10*3/uL (ref 0.1–1.0)
Monocytes Relative: 4 % (ref 3–12)
Neutro Abs: 4.6 10*3/uL (ref 1.7–7.7)
Neutrophils Relative %: 65 % (ref 43–77)
Platelets: 181 10*3/uL (ref 150–400)
RBC: 4.72 MIL/uL (ref 3.87–5.11)
RDW: 13.7 % (ref 11.5–15.5)
WBC: 7.1 10*3/uL (ref 4.0–10.5)

## 2011-01-20 MED ORDER — DOXYCYCLINE HYCLATE 100 MG PO TABS: 100.0000 mg | ORAL_TABLET | Freq: Two times a day (BID) | ORAL | Status: AC

## 2011-01-20 MED ORDER — FLUCONAZOLE 150 MG PO TABS: 150.0000 mg | ORAL_TABLET | Freq: Once | ORAL | Status: AC

## 2011-01-20 NOTE — Patient Instructions (Addendum)
You have skin infection , cellulitis. It is VITAL you take the entire course of antibiotics prescribed. Do not prick your skin, or the blisters. Medication is sent in. Lab today. Stop the injections you were taking for the diabetes, until the infection is entirely cleared up. Pls call and go to the ED if the infection gets worse. It is vital to bring meds to all visits for review.

## 2011-01-20 NOTE — Telephone Encounter (Signed)
Started  Injection for diabetes last week, has noticed blisters looking like pus is in them , no fever or chills, in different areas, no fever or chills.  Advised pt to stop the injections and come by the office so I can see the rash, advised she may need to go the ed also

## 2011-01-21 NOTE — Telephone Encounter (Signed)
Pt was evaluated in the office for cellulitis on 01/20/2011

## 2011-01-22 ENCOUNTER — Ambulatory Visit: Payer: Self-pay | Admitting: Family Medicine

## 2011-01-22 ENCOUNTER — Telehealth: Payer: Self-pay

## 2011-01-22 ENCOUNTER — Telehealth: Payer: Self-pay | Admitting: Family Medicine

## 2011-01-22 NOTE — Assessment & Plan Note (Signed)
Summary: office visit   Vital Signs:  Patient profile:   45 year old female Menstrual status:  regular Height:      59 inches Weight:      237 pounds BMI:     48.04 O2 Sat:      98 % Pulse rate:   120 / minute Pulse rhythm:   regular Resp:     16 per minute BP sitting:   160 / 128  (left arm) Cuff size:   large  Vitals Entered By: Everitt Amber LPN (January 06, 2011 2:12 PM)  Nutrition Counseling: Patient's BMI is greater than 25 and therefore counseled on weight management options. CC: Follow up chronic problems   Primary Care Provider:  Syliva Overman MD  CC:  Follow up chronic problems.  History of Present Illness: Reports  that she has not been doing well.Her blood sugars remain high, and she continues to smoke. Denies recent fever or chills. Denies sinus pressure, nasal congestion , ear pain or sore throat. Denies chest congestion, or cough productive of sputum. Denies chest pain, palpitations, PND, orthopnea or leg swelling. Denies abdominal pain, nausea, vomitting, diarrhea or constipation. Denies change in bowel movements or bloody stool. Denies dysuria , frequency, incontinence or hesitancy.  Denies headaches, vertigo, seizures.  Denies  rash, lesions, or itch.     Preventive Screening-Counseling & Management  Alcohol-Tobacco     Smoking Cessation Counseling: yes  Current Medications (verified): 1)  Furosemide 40 Mg  Tabs (Furosemide) .... One Tab By Mouth Once Daily 2)  Alprazolam 0.5 Mg  Tabs (Alprazolam) .... One Tab By Mouth Three Times A Day As Needed 3)  Fluticasone Propionate 50 Mcg/act  Susp (Fluticasone Propionate) .... Two Puffs Each Nostril Once Daily 4)  Klor-Con M20 20 Meq  Tbcr (Potassium Chloride Crys Cr) .... One Tab By Mouth Two Times A Day 5)  Aspirin 81 Mg  Tbec (Aspirin) .... One Tab By Mouth Once Daily 6)  Omeprazole 20 Mg  Cpdr (Omeprazole) .... Take One Cap By Mouth Once Daily 7)  Hydroxyzine Hcl 50 Mg Tabs (Hydroxyzine Hcl) ....  Take 1 At Bedtime 8)  Singulair 10 Mg Tabs (Montelukast Sodium) .... Take 1 Tablet By Mouth Once A Day 9)  Astelin 137 Mcg/spray Soln (Azelastine Hcl) .... 2 Puffs Per Nostril Twice Daily 10)  Ibuprofen 800 Mg Tabs (Ibuprofen) .... One Tab By Mouth Once Daily Prn 11)  Tramadol Hcl 50 Mg Tabs (Tramadol Hcl) .... One Tab By Mouth  Twice Daily As Needed 12)  Lisinopril 40 Mg Tabs (Lisinopril) .... Take 1 Tablet By Mouth Once A Day 13)  Maxzide 75-50 Mg Tabs (Triamterene-Hctz) .... Take 1 Tablet By Mouth Once A Day 14)  Aspirin 325 Mg Tabs (Aspirin) .... Once Daily 15)  Zyrtec Allergy 10 Mg Tabs (Cetirizine Hcl) .... Once Daily 16)  Paroxetine Hcl 20 Mg Tabs (Paroxetine Hcl) .... One Tab By Mouth Once Daily 17)  Clonidine Hcl 0.3 Mg Tabs (Clonidine Hcl) .... Take 1 Tab By Mouth At Bedtime 18)  Lovastatin 40 Mg Tabs (Lovastatin) .... 2 Tablets At Bedtime 19)  Metformin Hcl 1000 Mg Tabs (Metformin Hcl) .... Take 1 Tablet By Mouth Two Times A Day 20)  Cyclobenzaprine Hcl 10 Mg Tabs (Cyclobenzaprine Hcl) .... Take 1 Tab By Mouth At Bedtime 21)  Glipizide 10 Mg Xr24h-Tab (Glipizide) .... Take 1 Tablet By Mouth Two Times A Day 22)  Accu-Chek Aviva  Kit (Blood Glucose Monitoring Suppl) .... Once Daily Testing Dx: 250.00  23)  Accu-Chek Aviva  Strp (Glucose Blood) .... Once Daily Testing  Dx: 250.00 24)  Accu-Chek Multiclix Lancets  Misc (Lancets) .... Once Daily Testing Dx: 250.00  Allergies (verified): 1)  ! * Mca  Review of Systems      See HPI General:  Complains of fatigue and malaise. Eyes:  Denies discharge and red eye. MS:  Complains of joint pain, low back pain, mid back pain, and stiffness. Psych:  Complains of anxiety, depression, and mental problems; denies suicidal thoughts/plans, thoughts of violence, and unusual visions or sounds. Endo:  Denies excessive thirst and excessive urination; once daily testing fasting 150and above. Heme:  Denies abnormal bruising and bleeding. Allergy:   Complains of seasonal allergies; denies hives or rash and itching eyes.  Physical Exam  General:  Well-developed,obese,in no acute distress; alert,appropriate and cooperative throughout examination HEENT: No facial asymmetry,  EOMI, No sinus tenderness, TM's Clear, oropharynx  pink and moist.   Chest: Clear to auscultation bilaterally. Decreased air enry bilaterally CVS: S1, S2, No murmurs, No S3.   Abd: Soft, Nontender.  MS: decreased  ROM spine, hips, shoulders and knees.  Ext: No edema.   CNS: CN 2-12 intact, power tone and sensation normal throughout.   Skin: Intact, no visible lesions or rashes.  Psych: Good eye contact, normal affect.  Memory intact, not anxious or depressed appearing.    Impression & Recommendations:  Problem # 1:  ANXIETY (ICD-300.00) Assessment Unchanged  Her updated medication list for this problem includes:    Alprazolam 0.5 Mg Tabs (Alprazolam) ..... One tab by mouth three times a day as needed    Hydroxyzine Hcl 50 Mg Tabs (Hydroxyzine hcl) .Marland Kitchen... Take 1 at bedtime    Paroxetine Hcl 20 Mg Tabs (Paroxetine hcl) ..... One tab by mouth once daily  Problem # 2:  ALLERGIC RHINITIS (ICD-477.9) Assessment: Deteriorated  Her updated medication list for this problem includes:    Fluticasone Propionate 50 Mcg/act Susp (Fluticasone propionate) .Marland Kitchen..Marland Kitchen Two puffs each nostril once daily    Astelin 137 Mcg/spray Soln (Azelastine hcl) .Marland Kitchen... 2 puffs per nostril twice daily    Zyrtec Allergy 10 Mg Tabs (Cetirizine hcl) ..... Once daily  Problem # 3:  OBESITY (ICD-278.00) Assessment: Improved  Ht: 59 (01/06/2011)   Wt: 237 (01/06/2011)   BMI: 48.04 (01/06/2011)  Problem # 4:  HYPERTENSION (ICD-401.9) Assessment: Deteriorated  The following medications were removed from the medication list:    Amlodipine Besylate 10 Mg Tabs (Amlodipine besylate) .Marland Kitchen... Take 1 tablet by mouth once a day Her updated medication list for this problem includes:    Furosemide 40 Mg  Tabs (Furosemide) ..... One tab by mouth once daily    Lisinopril 40 Mg Tabs (Lisinopril) .Marland Kitchen... Take 1 tablet by mouth once a day    Maxzide 75-50 Mg Tabs (Triamterene-hctz) .Marland Kitchen... Take 1 tablet by mouth once a day    Clonidine Hcl 0.3 Mg Tabs (Clonidine hcl) .Marland Kitchen... Take 1 tab by mouth at bedtime    Amlodipine Besylate 10 Mg Tabs (Amlodipine besylate) .Marland Kitchen... Take 1 tablet by mouth once a day , to start 01/06/2011  Orders: T-Basic Metabolic Panel [phone])  BP today: 160/128 Prior BP: 140/100 (10/23/2010)  Labs Reviewed: K+: 3.8 (07/17/2010) Creat: : 0.79 (07/17/2010)   Chol: 239 (07/17/2010)   HDL: 45 (07/17/2010)   LDL: 114 (07/17/2010)   TG: 400 (07/17/2010)  Problem # 5:  HYPERLIPIDEMIA (ICD-272.4) Assessment: Comment Only  Her updated medication list for this problem includes:  Lovastatin 40 Mg Tabs (Lovastatin) .Marland Kitchen... 2 tablets at bedtime Low fat dietdiscussed and encouraged  Orders: Medicare Electronic Prescription [phone]) T-Hepatic Function [phone]) T-Lipid Profile [phone])  Labs Reviewed: SGOT: 17 (07/17/2010)   SGPT: 15 (07/17/2010)   HDL:45 (07/17/2010), 49 (01/07/2010)  LDL:114 (07/17/2010), See Comment mg/dL (13/05/6577)  IONG:295 (07/17/2010), 230 (01/07/2010)  Trig:400 (07/17/2010), 460 (01/07/2010)  Problem # 6:  DIABETES MELLITUS, TYPE II (ICD-250.00) Assessment: Deteriorated  Her updated medication list for this problem includes:    Aspirin 81 Mg Tbec (Aspirin) ..... One tab by mouth once daily    Lisinopril 40 Mg Tabs (Lisinopril) .Marland Kitchen... Take 1 tablet by mouth once a day    Aspirin 325 Mg Tabs (Aspirin) ..... Once daily    Metformin Hcl 1000 Mg Tabs (Metformin hcl) .Marland Kitchen... Take 1 tablet by mouth two times a day    Glipizide 10 Mg Xr24h-tab (Glipizide) .Marland Kitchen... Take 1 tablet by mouth two times a day    Byetta 5 Mcg Pen 5 Mcg/0.52ml Soln (Exenatide) .Marland KitchenMarland KitchenMarland KitchenMarland Kitchen 5 micrograms twice daily to start 01/07/2011    Byetta 10 Mcg Pen 10 Mcg/0.38ml Soln (Exenatide)  .Marland KitchenMarland KitchenMarland KitchenMarland Kitchen twice daily to start in 30 days  Orders: Glucose, (CBG) (28413) Medicare Electronic Prescription [phone]) T- Hemoglobin A1C [phone])  Labs Reviewed: Creat: 0.79 (07/17/2010)    Reviewed HgBA1c results: 10.0 (10/23/2010)  10.3 (07/17/2010)  Problem # 7:  NICOTINE ADDICTION (ICD-305.1) Assessment: Deteriorated  Encouraged smoking cessation and discussed different methods for smoking cessation.   Complete Medication List: 1)  Furosemide 40 Mg Tabs (Furosemide) .... One tab by mouth once daily 2)  Alprazolam 0.5 Mg Tabs (Alprazolam) .... One tab by mouth three times a day as needed 3)  Fluticasone Propionate 50 Mcg/act Susp (Fluticasone propionate) .... Two puffs each nostril once daily 4)  Klor-con M20 20 Meq Tbcr (Potassium chloride crys cr) .... One tab by mouth two times a day 5)  Aspirin 81 Mg Tbec (Aspirin) .... One tab by mouth once daily 6)  Omeprazole 20 Mg Cpdr (Omeprazole) .... Take one cap by mouth once daily 7)  Hydroxyzine Hcl 50 Mg Tabs (Hydroxyzine hcl) .... Take 1 at bedtime 8)  Singulair 10 Mg Tabs (Montelukast sodium) .... Take 1 tablet by mouth once a day 9)  Astelin 137 Mcg/spray Soln (Azelastine hcl) .... 2 puffs per nostril twice daily 10)  Ibuprofen 800 Mg Tabs (Ibuprofen) .... One tab by mouth once daily prn 11)  Tramadol Hcl 50 Mg Tabs (Tramadol hcl) .... One tab by mouth  twice daily as needed 12)  Lisinopril 40 Mg Tabs (Lisinopril) .... Take 1 tablet by mouth once a day 13)  Maxzide 75-50 Mg Tabs (Triamterene-hctz) .... Take 1 tablet by mouth once a day 14)  Aspirin 325 Mg Tabs (Aspirin) .... Once daily 15)  Zyrtec Allergy 10 Mg Tabs (Cetirizine hcl) .... Once daily 16)  Paroxetine Hcl 20 Mg Tabs (Paroxetine hcl) .... One tab by mouth once daily 17)  Clonidine Hcl 0.3 Mg Tabs (Clonidine hcl) .... Take 1 tab by mouth at bedtime 18)  Lovastatin 40 Mg Tabs (Lovastatin) .... 2 tablets at bedtime 19)  Metformin Hcl 1000 Mg Tabs (Metformin hcl)  .... Take 1 tablet by mouth two times a day 20)  Cyclobenzaprine Hcl 10 Mg Tabs (Cyclobenzaprine hcl) .... Take 1 tab by mouth at bedtime 21)  Glipizide 10 Mg Xr24h-tab (Glipizide) .... Take 1 tablet by mouth two times a day 22)  Accu-chek Aviva Kit (Blood glucose monitoring suppl) .... Once  daily testing dx: 250.00 23)  Accu-chek Aviva Strp (Glucose blood) .... Three times a day testing dx: uncontrolled blood sugar 24)  Accu-chek Multiclix Lancets Misc (Lancets) .... Three times a day testing dx: uncontrolled blood sugar 25)  Byetta 5 Mcg Pen 5 Mcg/0.21ml Soln (Exenatide) .... 5 micrograms twice daily to start 01/07/2011 26)  Byetta 10 Mcg Pen 10 Mcg/0.75ml Soln (Exenatide) .Marland Kitchen.. twice daily to start in 30 days 27)  Amlodipine Besylate 10 Mg Tabs (Amlodipine besylate) .... Take 1 tablet by mouth once a day , to start 01/06/2011 28)  Bd Pen Needle Short U/f 31g X 8 Mm Misc (Insulin pen needle) .... Use two times a day subcutaneously with byetta dx:250.00  Patient Instructions: 1)  Please schedule a  nurse bP check in 4 weeks, and an MD follow-up appointment in 2  months 2)  Tobacco is very bad for your health and your loved ones! You Should stop smoking!. 3)  Stop Smoking Tips: Choose a Quit date. Cut down before the Quit date. decide what you will do as a substitute when you feel the urge to smoke(gum,toothpick,exercise). 4)  It is important that you exercise regularly at least 20 minutes 5 times a week. If you develop chest pain, have severe difficulty breathing, or feel very tired , stop exercising immediately and seek medical attention. 5)  You need to lose weight. Consider a lower calorie diet and regular exercise.  6)  Check your blood sugars 2 to 3 times daily. If your readings are usually above 250: or below 70 you should contact our office. 7)  new med is byetta, we will  teach you how to take this 8)  BMP prior to visit, ICD-9: 9)  Hepatic Panel prior to visit, ICD-9: 10)  Lipid  Panel prior to visit, ICD-9:fasting in 2 months 11)  HbgA1C prior to visit, ICD-9: Prescriptions: BD PEN NEEDLE SHORT U/F 31G X 8 MM MISC (INSULIN PEN NEEDLE) use two times a day subcutaneously with byetta Dx:250.00  #100 x 2   Entered by:   Adella Hare LPN   Authorized by:   Syliva Overman MD   Signed by:   Adella Hare LPN on 40/98/1191   Method used:   Electronically to        [facility]* (retail)       [address] [place], [place]  [address]       Ph: [phone]       Fax: [phone]   RxID:   [id number] PAROXETINE HCL 20 MG TABS (PAROXETINE HCL) one tab by mouth once daily  #30 x 3   Entered by:   Adella Hare LPN   Authorized by:   Syliva Overman MD   Signed by:   Adella Hare LPN on 44/12/4740   Method used:   Electronically to        [facility]* (retail)       [address] [place], [place]  [address]       Ph: [phone]       Fax: [phone]   RxID:   [id number] MAXZIDE 75-50 MG TABS (TRIAMTERENE-HCTZ) Take 1 tablet by mouth once a day  #30 Each x 3   Entered by:   Adella Hare LPN   Authorized by:   Syliva Overman MD  Signed by:   Adella Hare LPN on 98/08/9146   Method used:   Electronically to        [facility]* (retail)       [address] [place], [place]  [address]       Ph: [phone]       Fax: [phone]   RxID:   [id number] TRAMADOL HCL 50 MG TABS (TRAMADOL HCL) one tab by mouth  twice daily as needed  #30 Each x 3   Entered by:   Adella Hare LPN   Authorized by:   Syliva Overman MD   Signed by:   Adella Hare LPN on 03/47/4259   Method used:   Electronically to        [facility]* (retail)       [address] [place], [place]  [address]       Ph: [phone]       Fax: [phone]   RxID:   [id number] IBUPROFEN 800 MG TABS (IBUPROFEN) one tab  by mouth once daily prn  #30 x 3   Entered by:   Adella Hare LPN   Authorized by:   Syliva Overman MD   Signed by:   Adella Hare LPN on 54/27/0623   Method used:   Electronically to        [facility]* (retail)       [address] [place], [place]  [address]       Ph: [phone]       Fax: [phone]   RxID:   [id number] SINGULAIR 10 MG TABS (MONTELUKAST SODIUM) Take 1 tablet by mouth once a day Brand medically necessary #30 Each x 3   Entered by:   Adella Hare LPN   Authorized by:   Syliva Overman MD   Signed by:   Adella Hare LPN on 93/71/6967   Method used:   Electronically to        [facility]* (retail)       [address] [place], [place]  [address]       Ph: [phone]       Fax: [phone]   RxID:   [id number] OMEPRAZOLE 20 MG  CPDR (OMEPRAZOLE) take one cap by mouth once daily  #30 Each x 3   Entered by:   Adella Hare LPN   Authorized by:   Syliva Overman MD   Signed by:   Adella Hare LPN on 50/93/2671   Method used:   Electronically to        [facility]* (retail)       [address]       [place], [place]  [address]       Ph: [phone]       Fax: [phone]   RxID:   [id number] ACCU-CHEK MULTICLIX LANCETS  MISC (LANCETS) three times a day testing dx: uncontrolled blood sugar  #100 x 2   Entered by:   Adella Hare LPN   Authorized by:   Syliva Overman MD   Signed by:   Adella Hare LPN on 26/83/4196   Method used:  Electronically to        [facility]* (retail)       [address]       [place], [place]  [address]       Ph: [phone]       Fax: [phone]   RxID:   [id number] ACCU-CHEK AVIVA  STRP (GLUCOSE BLOOD) three times a day testing dx: uncontrolled blood sugar  #100 x 2   Entered by:   Adella Hare LPN   Authorized by:   Syliva Overman MD   Signed by:    Adella Hare LPN on 40/07/2724   Method used:   Electronically to        [facility]* (retail)       [address] [place], [place]  [address]       Ph: [phone]       Fax: [phone]   RxID:   [id number] AMLODIPINE BESYLATE 10 MG TABS (AMLODIPINE BESYLATE) Take 1 tablet by mouth once a day , to start 01/06/2011  #30 x 2   Entered and Authorized by:   Syliva Overman MD   Signed by:   Syliva Overman MD on 01/06/2011   Method used:   Electronically to        [facility]* (retail)       [address]       [place], [place]  [address]       Ph: [phone]       Fax: [phone]   RxID:   [id number] BYETTA 10 MCG PEN 10 MCG/0.04ML SOLN (EXENATIDE) twice daily to start in 30 days  #641mcg x 3   Entered and Authorized by:   Syliva Overman MD   Signed by:   Syliva Overman MD on 01/06/2011   Method used:   Printed then faxed to ...       [facility]* (retail)       [address]       [place], [place]  [address]       Ph: [phone]       Fax: [phone]   RxID:   [id number] BYETTA 5 MCG PEN 5 MCG/0.02ML SOLN (EXENATIDE) 5 micrograms twice daily to start 01/07/2011  #300 mcg x 0   Entered and Authorized by:   Syliva Overman MD   Signed by:   Syliva Overman MD on 01/06/2011   Method used:   Printed then faxed to ...       [facility]* (retail)       [address]       [place], [place]  [address]       Ph: [phone]       Fax: [phone]   RxID:   [id number]    Orders Added: 1)  Glucose, (CBG) [82962] 2)  Est. Patient Level IV [09983] 3)  Medicare Electronic Prescription [G8553] 4)  T-Basic Metabolic Panel [80048-22910] 5)  T-Hepatic Function [80076-22960] 6)  T-Lipid Profile [80061-22930] 7)  T- Hemoglobin A1C [83036-23375]    Laboratory Results   Blood Tests     Glucose  (random): 210 mg/dL   (Normal Range: 38-250)

## 2011-01-22 NOTE — Telephone Encounter (Signed)
pls let her know labs were very good, entirely normal, I hope her skin is improving also, if not then ED (I expect it to be )I tried to call but msg machine came on

## 2011-01-22 NOTE — Telephone Encounter (Signed)
Patient aware.

## 2011-01-25 ENCOUNTER — Encounter: Payer: Self-pay | Admitting: Family Medicine

## 2011-01-25 NOTE — Progress Notes (Signed)
Subjective:    Patient ID: [name], female    DOB: [birth year], 45 y.o.   MRN: [id number]  Diabetes She presents for her follow-up diabetic visit. She has type 2 diabetes mellitus. No MedicAlert identification noted. Her disease course has been worsening. There are no hypoglycemic associated symptoms. Pertinent negatives for hypoglycemia include no confusion, dizziness, headaches, nervousness/anxiousness, seizures, speech difficulty or tremors. Associated symptoms include fatigue, polydipsia, polyphagia and polyuria. Pertinent negatives for diabetes include no chest pain and no weakness. There are no hypoglycemic complications. Risk factors for coronary artery disease include diabetes mellitus and hypertension. She is compliant with treatment some of the time. Her breakfast blood glucose range is generally >200 mg/dl. Her lunch blood glucose range is generally >200 mg/dl.   Pt comes  in with a primary concern of purulent blisters in different areas of her body for the past 1 week since starting injections for her diabetes. Of note 2 other family members who she is in contact with have recently been diagnosed with mRSA.She denies fever or chills.    Review of Systems  Constitutional: Positive for chills and fatigue. Negative for fever, activity change, appetite change and unexpected weight change.  HENT: Positive for congestion and postnasal drip. Negative for hearing loss, ear pain, sore throat, rhinorrhea, sneezing, trouble swallowing, neck pain, neck stiffness and sinus pressure.   Eyes: Negative for photophobia, pain, discharge, redness, itching and visual disturbance.  Respiratory: Negative for cough, chest tightness, shortness of breath and wheezing.   Cardiovascular: Negative for chest pain, palpitations and leg swelling.  Gastrointestinal: Negative for nausea, vomiting, abdominal pain, diarrhea, constipation and blood in stool.  Genitourinary: Positive for polyuria. Negative for  dysuria, frequency, hematuria and flank pain.  Musculoskeletal: Positive for back pain. Negative for myalgias, joint swelling, arthralgias and gait problem.  Skin: Positive for wound. Negative for rash.  Neurological: Negative for dizziness, tremors, seizures, speech difficulty, weakness, numbness and headaches.  Hematological: Positive for polydipsia and polyphagia. Negative for adenopathy. Does not bruise/bleed easily.  Psychiatric/Behavioral: Negative for suicidal ideas, hallucinations, behavioral problems, confusion, sleep disturbance and decreased concentration. The patient is not nervous/anxious and is not hyperactive.        Objective:   Physical Exam  [nursing notereviewed. Constitutional: She is oriented to person, place, and time. She appears well-developed and well-nourished.  HENT:  Head: Normocephalic.  Right Ear: External ear normal.  Left Ear: External ear normal.  Mouth/Throat: No oropharyngeal exudate.  Eyes: Conjunctivae and EOM are normal. Right eye exhibits no discharge. Left eye exhibits no discharge. No scleral icterus.  Neck: Normal range of motion. Neck supple. No JVD present. No tracheal deviation present. No thyromegaly present.  Cardiovascular: Normal rate, regular rhythm, normal heart sounds and intact distal pulses.   No murmur heard. Pulmonary/Chest: Effort normal and breath sounds normal. No stridor. No respiratory distress. She has no wheezes. She has no rales. She exhibits no tenderness.  Abdominal: Soft. Bowel sounds are normal. There is no tenderness. There is no rebound and no guarding.  Musculoskeletal: Normal range of motion. She exhibits no edema.  Lymphadenopathy:    She has no cervical adenopathy.  Neurological: She is alert and oriented to person, place, and time. No cranial nerve deficit. Coordination normal.  Skin: Skin is warm. No rash noted. There is erythema.       multiple blisters , some ulcerated, lg one on left upper arm with purulent  material  Psychiatric: She has a normal mood and affect. Her behavior  is normal. Judgment and thought content normal.          Assessment & Plan:  1.Cellulitis with purlent blisters and recent MRSA exposure, will treat as MRSA. 2.Diabetes: deteriorated,importance of diet and medication adherence stressed. 3.hTN: uncontrolled, no med changes at this time. 4. Depression stable 5.Obesity: deteriorated, lifestyle change discussed and encouraged to improve overall health

## 2011-01-26 ENCOUNTER — Telehealth: Payer: Self-pay | Admitting: Family Medicine

## 2011-01-27 LAB — CULTURE, BLOOD (SINGLE): Organism ID, Bacteria: NO GROWTH

## 2011-01-27 NOTE — Telephone Encounter (Signed)
Does patient qualify for help?

## 2011-01-27 NOTE — Telephone Encounter (Signed)
Called patient back at both numbers. Will advise she does not qualify if she calls back

## 2011-01-27 NOTE — Telephone Encounter (Signed)
She is able to bathe and dress herself and cook i do not see the medical justification, no

## 2011-02-02 ENCOUNTER — Other Ambulatory Visit: Payer: Self-pay | Admitting: Family Medicine

## 2011-02-04 ENCOUNTER — Encounter: Payer: Self-pay | Admitting: Family Medicine

## 2011-02-06 ENCOUNTER — Ambulatory Visit (INDEPENDENT_AMBULATORY_CARE_PROVIDER_SITE_OTHER): Payer: Medicare Other | Admitting: Family Medicine

## 2011-02-06 VITALS — BP 122/90 | Wt 236.0 lb

## 2011-02-06 DIAGNOSIS — I10 Essential (primary) hypertension: Secondary | ICD-10-CM

## 2011-02-06 NOTE — Progress Notes (Signed)
BP has improved since last checked to 122/90. No med changes needed

## 2011-02-06 NOTE — Progress Notes (Signed)
  Subjective:    Patient ID: [name], female    DOB: 12/10/1965, 45 y.o.   MRN: [id number]  HPI    Review of Systems     Objective:   Physical Exam        Assessment & Plan:  BP rechecked, and value obtained is as above, no med change

## 2011-03-04 ENCOUNTER — Encounter: Payer: Self-pay | Admitting: Family Medicine

## 2011-03-10 NOTE — Assessment & Plan Note (Signed)
NAMEMarland Kitchen  ANYLA, ISRAELSON               CHART#:  [id number]   DATE:  10/19/2007                       DOB:  [birth year]   REFERRING PHYSICIAN:  [name]. [name], M.D.   PROBLEM LIST:  1. Rectal bleeding, secondary to moderate internal hemorrhoids.  2. Type 2 diabetes.  3. Morbid obesity.  4. Hypertension.  5. Hyperlipidemia.  6. Depression.  7. Anxiety.  8. Insomnia.   SUBJECTIVE:  [name] is a 45 year old female, who presents as a  return patient visit.  She is not seeing any more rectal bleeding.  She  did go to [facility] and get information regarding gastric bypass surgery and  a lap band and has decided to wait.   MEDICATIONS:  1. Diovan.  2. Aspirin.  3. Hydrocodone as needed.  4. Centrum.  5. Glipizide.  6. Lasix.  7. Betaprone.  8. Lexapro.  9. Verapamil.  10.__________  1. Vytorin.  2. Hydralazine.  3. Xanax.  4. Piroxicam.  5. Ibuprofen.  6. Hydroxyzine.  7. Soma.   OBJECTIVE:  Weight 270.5 pounds (up 27 pounds since 2007).  Height 5  feet 1.  BMI 51 (morbidly obese).  Temperature 98.1, blood pressure  150/98, pulse 72.GENERAL:  She is in no apparent distress, alert and  oriented times four.  LUNGS:  Clear to auscultation bilaterally.CARDIOVASCULAR EXAM:  Regular  rhythm, no murmur, normal S1 and S2.ABDOMEN:  Bowel sounds present,  soft, nontender, nondistended.  No rebound or guarding.  Obese.NEUROLOGIC:  She has no focal neurologic deficits.   ASSESSMENT:  Ms. Rhines is a 45 year old female, who has rectal bleeding  and her hemorrhoids seem to be adequately treated.  Thank you for  allowing me to see Ms. Geary in consultation.  My recommendations  follow.   RECOMMENDATIONS:  1. She should continue her pursuit of weight-loss.  She needs no      further management for her rectal bleeding.  2. She may follow up with me in 12 months just to make sure she has no      additional need for hemorrhoid management.       Kassie Mends, M.D.  Electronically  Signed     SM/MEDQ  D:  10/19/2007  T:  10/19/2007  Job:  [id number]   cc:   [name]. [name], M.D.

## 2011-03-10 NOTE — Assessment & Plan Note (Signed)
NAMEMarland Kitchen  BRAELYNN, BENNING               CHART#:  [id number]   DATE:  11/06/2008                       DOB:  [birth year]   REFERRING PHYSICIAN:  [name]. [name], MD   PROBLEM LIST:  1. Rectal bleeding secondary to moderate internal hemorrhoids.  2. Diabetes.  3. Morbid obesity.  4. Hypertension.  5. Hyperlipidemia.  6. Depression.  7. Anxiety.  8. Insomnia.   SUBJECTIVE:  Ms. Walthour is a 45 year old female who presents as a return  patient visit.  She was last seen in December 2008.  She did go for  evaluation for gastric bypass at [facility], but declined to have  the surgery.  She stopped her anxiolytics and antidepressants and was  started on phentermine.  As a result, she has lost 50 pounds.  She  denies any rectal bleeding.   MEDICATIONS:  1. Hydrocodone as needed.  2. Centrum.  3. Lasix 40 mg daily.  4. Lidocaine nasal spray.  5. Hydralazine 25 mg t.i.d.  6. Xanax as needed.  7. Hydroxyzine at night.  8. Ibuprofen daily.  9. Soma daily.  10.Omeprazole 20 mg daily.  11.Lisinopril 20 mg daily.  12.Phentermine 37.5 mg daily.  13.Potassium chloride 40 mEq daily.  14.Fluticasone daily.  15.Patanol daily.  16.Astelin b.i.d.  17.Simvastatin 40 mg daily.  18.Singulair 10 mg daily.  19.Amlodipine 5 mg daily.  20.Tramadol as needed.  21.Zyrtec 10 mg daily.   OBJECTIVE:  VITAL SIGNS:  Weight 219 pounds, height 5 feet 1 inch, BMI  41.4 (morbidly obese), temperature 98, blood pressure 128/80, and pulse  is 96.GENERAL:  She is in no apparent distress.  Alert and oriented  x4.HEENT:  Atraumatic and normocephalic.  Pupils equal and reactive to  light.  Mouth, no oral lesions.  Posterior pharynx without erythema or  exudate.NECK:  Full range of motion.  No lymphadenopathy.LUNGS:  Clear  to auscultation bilaterally.CARDIOVASCULAR:  Regular rhythm.  No murmur.  Normal S1 and S2.ABDOMEN:  Bowel sounds are present, soft, nontender,  and nondistended.  No rebound or  guarding.EXTREMITIES:  No cyanosis or  edema.NEUROLOGIC:  She has no focal neurologic deficits.   ASSESSMENT:  Ms. Pincus is a 45 year old female who has begun to  successfully lose weight with the assistance of phentermine.  She has  had no rectal bleeding. Thank you for allowing me to see Ms. Stjames in  consultation.  My recommendations follow.   RECOMMENDATIONS:  1. She may follow up in 12 months for reassessment.  2. She is instructed to go to the [employer] store to get a colon      cleanse prep.       Kassie Mends, M.D.  Electronically Signed     SM/MEDQ  D:  11/06/2008  T:  11/06/2008  Job:  [id number]   cc:   [name]. [name], M.D.

## 2011-03-13 NOTE — Discharge Summary (Signed)
[name], [name] NO.:  [id number]   MEDICAL RECORD NO.:  [id number]          PATIENT TYPE:  INP   LOCATION:  A210                          FACILITY:  [facility]   PHYSICIAN:  Vania Rea, M.D. DATE OF BIRTH:  1966/08/12   DATE OF ADMISSION:  09/23/2004  DATE OF DISCHARGE:  11/30/2005LH                                 DISCHARGE SUMMARY   PRIMARY CARE PHYSICIAN:  [name]. [name], M.D.   DISCHARGE DIAGNOSES:  1.  Hypertensive urgency, resolved.  2.  Cocaine abuse.  3.  History of congestive heart failure.  4.  Tobacco abuse.  5.  History of bipolar disorder.  6.  Depression.  7.  History of Trichomonas.   DISPOSITION:  Discharged to home.   DISCHARGE CONDITION:  Stable.   DISCHARGE MEDICATIONS:  1.  Catapres-TTS-3 weekly, each Wednesday.  2.  Clonidine 0.2 mg twice daily for one day, then 0.1 mg twice daily for      one day.  3.  Norvasc 5 mg daily.  4.  Tarka 2/240 mg daily.  5.  Lasix 40 mg daily.  6.  Nicotine patch 21 mg per 24 hours daily.  7.  Zyrtec 10 mg daily.  8.  Flonase one spray in both nostrils daily.  9.  Flagyl 500 mg three times daily for one week.   HOSPITAL COURSE:  Please refer to yesterday's admission history and  physical.  This is a 45 year old patient of [name]. [name], M.D. who on  a routine visit to Dr. Anthony Sar office was found to have a blood pressure  of 220/140 and was complaining of a headache and memory impairment.  Patient  denied cocaine use. She was admitted directly to the Concentrated Care Unit  at [facility], where her blood pressure initially was found to be  220/160 and then later on 190/115.  She was treated with intravenous  Apresoline then when her urine drug screen was found to be positive for  cocaine, she was started on clonidine 0.1 mg every two hours to keep her  systolic blood pressure at around 140.  The patient required only about 0.6  mg of Clonidine in 24 hours to normalize  her blood pressure and in view of  her belated admission of continued cocaine use, the Toprol XL which she was  taking on admission is being discontinued.  The patient's [name] was  continued and Norvasc was added to her regimen.  The patient admits to  having been diagnosed with bipolar disorder but has not followed up on  treatment of this.  The patient says she has been using Nicotrol inhaler for  smoking tobacco addiction for the past month and that has been very helpful.  She says she would also likes some assistance to quit cocaine completely and  is being referred to outpatient detox.   PHYSICAL EXAMINATION:  GENERAL:  This morning the patient is alert and  oriented and is in no distress.  VITAL SIGNS:  Her temperature is 97.7, her pulse is 75, respirations 20,  blood pressure 119/90.  Weight is recorded as  227 pounds.  CHEST:  Clear to auscultation bilaterally.  CARDIOVASCULAR:  Regular rhythm.  ABDOMEN:  Obese, soft, and nontender.  EXTREMITIES:  She has no edema.   Her CBC is entirely normal.  Her serum chemistry is entirely normal.  Her  BUN is 11.  Her creatinine is 1.0.  Her potassium is 11.  Her calcium is  8.6.  Her B-Type natriuretic peptide is 1 23.  Her TSH is 0.69.  Her  urinalysis was positive for cocaine and marijuana.  Urine, on admission, was  nitrite positive and had many bacteria.  She was being investigated by her  primary care physician as an outpatient and we received a call today to say  she had Trichomonas in her urine.  Flagyl is being added to her discharge  medications and any other treatment for urinary tract infection can be  followed by her primary care physician.   FOLLOW UP:  1.  Follow up with her primary care physician at the next scheduled      appointment.  2.  Follow up with [facility] on Monday, September 29, 2004, at 12:30 p.m.   SPECIAL INSTRUCTIONS:  The patient's blood pressure medications will  probably need  further adjustment.  She is to avoid beta-blockers for the  time being.  Her Norvasc can probably be increased and her Catapres may also  be increased if necessary.     Leop   LC/MEDQ  D:  09/25/2004  T:  09/25/2004  Job:  [id number]

## 2011-03-13 NOTE — H&P (Signed)
[name], WIECZOREK NO.:  [id number]   MEDICAL RECORD NO.:  [id number]          PATIENT TYPE:  INP   LOCATION:  A202                          FACILITY:  [facility]   PHYSICIAN:  Vania Rea, M.D. DATE OF BIRTH:  1966-08-11   DATE OF ADMISSION:  05/18/2005  DATE OF DISCHARGE:  LH                                HISTORY & PHYSICAL   PRIMARY CARE PHYSICIAN:  [name]. [name], M.D.   CHIEF COMPLAINT:  Elevated blood pressure and depression.   HISTORY OF PRESENT ILLNESS:  This is a 45 year old African American lady  with a history of polysubstance abuse who was recently discharged from the  hospital on May 11, 2005 after treatment for hypertensive urgency,  congestive heart failure, and anxiety disorder. The patient was discharged  home on ACE inhibitor, beta blocker, calcium channel blocker, and Lasix. She  had a follow-up visit with her primary care physician today and her blood  pressure was found to be 250/140; notes also indicate complaints of  depression, suicidal ideation, and wanting to be delivered from cocaine  abuse. The patient was sent over to the emergency room where her blood  pressure was found to be 200/147. However, she denied suicidal ideation but  said she would really like inpatient detox. The hospitalist service was  called and the patient was sent to the ward for admission.   Currently, the patient denies any recent cocaine abuse. She said she last  took cocaine about two weeks ago despite her urine drug screen being  positive. She said her only active symptoms are headache and feeling  depressed about her social situation. She wants to get away from people,  places, and things in order to be detoxed from her cocaine habit. The  patient says she has been taking her medications regularly since discharge  and is unsure why her blood pressure is elevated. She is also currently  craving cigarettes.   The patient denies fever, cough or cold,  chest pain, shortness of breath, or  increased swelling of the lower extremities. She does have chronic orthopnea  and snoring when she lies flat.   She was treated with clonidine and labetalol in the ER.   PAST MEDICAL HISTORY:  1.  Hypertensive urgency.  2.  History of congestive heart failure.  3.  Anxiety disorder.  4.  Polysubstance abuse.  5.  Tobacco addiction.   MEDICATIONS:  1.  Toprol XL 100 mg daily.  2.  Tarka 2/240 1 daily.  3.  Lasix 40 mg daily.  4.  Wellbutrin XL 300 mg daily.  5.  Xanax 0.5 mg t.i.d. p.r.n.   ALLERGIES:  No known drug allergies.   SOCIAL HISTORY:  Lives in [place] on disability because of her heart  failure. Has a 10 to 15-pack year history. Occasional alcohol use. Denies  cocaine use within the past two weeks. Also denies recent marijuana use.  Denies IV drug use.   FAMILY HISTORY:  Significant for hypertension, coronary artery disease, and  other atherosclerotic disease. Some history of cancers.   REVIEW OF SYSTEMS:  A 10-point  review of systems was otherwise negative.   PHYSICAL EXAMINATION:  GENERAL:  A pleasant, obese African American lady  lying in bed in no acute distress at this time.  VITAL SIGNS:  Blood pressure 165/115, pulse 77, respirations 16, temperature  97.2. She is saturating at 95% on room air. Her weight is recorded as 222  pounds. Her height is not recorded but she looks less than 5 foot 3.  HEENT:  Pupils are round and equal. She has no lymphadenopathy. No thyroid  problems.  CHEST:  Clear to auscultation bilaterally.  CARDIOVASCULAR:  Regular rhythm without murmur.  ABDOMEN:  Obese, soft and nontender.  EXTREMITIES:  She has a trace edema bilaterally. A pulse is 2+ bilaterally.   LABORATORY DATA:  White count is 8.5, hemoglobin 16.6, MCV 93, RDW 13.1,  platelets 217,000. She has a normal differential and a white count. Sodium  is 140, potassium 3.6, chloride 106, CO2 25, glucose 91, BUN 9, creatinine  1.2. Her  liver function tests are unremarkable. Her urine drug screen is  positive only for cocaine. Alcohol level is less than 5.   ASSESSMENT:  1.  Hypertensive urgency.  2.  Depression.  3.  Polysubstance abuse.  4.  History of congestive heart failure.   PLAN:  We will discontinue her beta blocker and advise her not to use Toprol  in the future since she continues to have cocaine in her system and this can  cause paradoxical hypertension.  We will start her on a high dose Clonidine patch and Clonidine p.o. p.r.n.  to control her blood pressure overnight. We will continue her calcium  channel blocker and ACE inhibitor combination.  We will continue Wellbutrin for depression and we will add a nicotine patch.  We will continue Xanax p.r.n. for her anxiety.  We will continue once daily Lasix and we will withhold potassium until we  have a repeat potassium level in the morning.  There is no echocardiogram recorded on the patient's chart and we will try  to get an echocardiogram before she is discharged. We will have ACT Team,  although she is denying suicidal ideation to discuss treatment options. She  does have a private psychiatrist that she sees and she talks of going to an  inpatient facility in [place].       LC/MEDQ  D:  05/18/2005  T:  05/18/2005  Job:  [id number]

## 2011-03-13 NOTE — Discharge Summary (Signed)
NAMECEASIA, ELWELL NO.:  [id number]   MEDICAL RECORD NO.:  [id number]          PATIENT TYPE:  INP   LOCATION:  A227                          FACILITY:  [facility]   PHYSICIAN:  Vania Rea, M.D. DATE OF BIRTH:  February 10, 1966   DATE OF ADMISSION:  08/11/2005  DATE OF DISCHARGE:  10/20/2006LH                                 DISCHARGE SUMMARY   DISCHARGE DIAGNOSES:  1.  Uncontrolled hypertension stage 3,  2.  Congestive heart failure, compensated.  3.  Diabetes type 2, controlled.  4.  Noncompliance with medical advise.  5.  History of substance abuse.   DISPOSITION:  Discharged to home.   CONDITION ON DISCHARGE:  Stable.   DISCHARGE MEDICATIONS:  1.  Verapamil 360 mg daily.  2.  Mavik 4 mg daily.  3.  Toprol-XL 200 mg daily.  4.  Hydralazine 50 mg three times daily.  5.  Glipizide 5 mg daily.  6.  Lasix 40 mg daily.  7.  Wellbutrin 200 mg daily.  8.  Xanax 0.5 mg three times daily as necessary.   HOSPITAL COURSE:  Please refer to admission history and physical dictated on  August 11, 2005 by Dr. Sherle Poe. This is a 45 year old African-American  lady with recurrent admissions for uncontrolled hypertension. The patient  was once again admitted with a history of noncompliance with her medications  and diet with her health status decompensated. Her medications were  gradually reintroduced. I am seeing the patient today. She seems to be  stable. Her blood pressure is controlled. Her blood sugar is controlled and  she is assessed as being stable for discharge. The patient advises me that  she was advised to throw away all of her old medications and she has done  this. She will need a new set of prescriptions for everything. She has been  given a 11-month supply of prescriptions for all medications above except for  the Xanax, which she has been given 20 tablets, which should last her  approximately 1 week and she is to follow up with her primary care  physician  in 1 week.   PHYSICAL EXAMINATION:  GENERAL:  On this morning's physical examination, she  is alert and oriented. She has no complaints. She is anxious to be  discharged home.  VITAL SIGNS:  Her temperature is 97, her pulse is 69, respiratory rate 18,  and blood pressure is 123/86. Over the past 24 hours, it has ranged between  107/67, 122/85. Her fasting blood sugar is 105. Her bedtime blood sugar was  130. She has required 6 units of NovoLog over the past 24 hours. Her weight  is recorded at 218 pounds.  HEENT:  Her pupils are round and equal.  CHEST:  Clear to auscultation bilaterally.  CARDIOVASCULAR:  Regular rhythm.  ABDOMEN:  Obese, soft, and nontender.  EXTREMITIES:  Without edema.   LABORATORY DATA:  Her CBC done yesterday was completely normal. Her BMET  done yesterday was completely normal. Her blood sugar was 110. Calcium was  8.9. Her BNP yesterday was 160. Her urine drug screen this admission was  positive for cocaine.      Vania Rea, M.D.  Electronically Signed     LC/MEDQ  D:  08/14/2005  T:  08/14/2005  Job:  [id number]   cc:   Margaretmary Dys, M.D.

## 2011-03-13 NOTE — H&P (Signed)
[facility]  Patient:    [name], [name] Visit Number: [id number] MRN: [id number]          Service Type: MED Location: ICCU UV25 [id number] Attending Physician:  Darlin Priestly Dictated by:   Colette Ribas, M.D. Admit Date:  10/30/2001                           History and Physical  PRIMARY PHYSICIAN:  Unassigned, medical backup.  CONSULTING PHYSICIAN:  [facility].  ADMITTING DIAGNOSIS:  Noncompliant hypertensive crisis.  ADDITIONAL DIAGNOSES:  1.  Noncompliant with medications.  2.  Congestive heart failure.  HISTORY OF PRESENT ILLNESS:  This is a 45 year old female, with severe hypertension and resultant cardiomyopathy, with congestive heart failure, who presents to the emergency department with hypertensive crisis.  She had stopped all of her medications as she could not afford them.  She had progressively become more short of breath.  She had no associated chest pain or radiation of chest pain.  She had no respiratory symptomatology, either. She came into the emergency department and was found to be hypertensive with systolics of 240s-250s, diastolics in the 150s, and was started on Nipride drip.  She responded quite beautifully to the drip, with resolution of hypertension.  They tried to get her off the Nipride drip and her hypertension recurred quite significantly.  It was felt prudent to keep her on the Nipride drip for at least a few hours until we could restart her home medications.  PAST MEDICAL HISTORY:  1.  Hypertension.  2.  Cardiomyopathy.  3.  History of congestive heart failure.  PAST SURGICAL HISTORY:  None.  MEDICATIONS (which the patient was not taking):  1.  Verapamil 240 mg q.d.  2.  Potassium chloride 20 mEq q.d.  3.  Labetalol 200 mg t.i.d.  SOCIAL HISTORY:  Recently started working at [employer].  Pack a day smoker.  No alcohol use.  FAMILY HISTORY:  Quite significant for coronary artery disease,  hypertension, diabetes.  PHYSICAL EXAMINATION:  VITAL SIGNS:  TEMP 97.8 degrees, pulse 90, respirations 20, blood pressure 185/135.  GENERAL:  When I saw the patient she was pleasant, talkative, in no acute distress.  HEENT:  Normocephalic, atraumatic.  PERRL.  EOMI.  Nasopharynx and oropharynx clear.  NECK:  Supple.  No JVD.  CHEST:  Bilateral basilar crackles, with no wheezing, no rhonchi, areas of consolidation.  CARDIOVASCULAR:  Regular rate and rhythm.  Normal S1 and S2.  No S3 or S4.  ABDOMEN:  Bowel sounds positive.  Soft, nontender.  EXTREMITIES:  No clubbing, cyanosis.  There is 1+ pedal and pretibial edema.  LABORATORY DATA:  ECG showed sinus tachycardia with right atrial enlargement, no other ST changes seen.  Chest x-ray showed congestive heart failure and cardiomyopathy.  Troponin 0.04.  CPK 73, MB 2.5.  Liver functions normal.  BUN 8, creatinine 0.9, sodium 137, potassium 3.5, chloride 107.  CBC normal.  ASSESSMENT:  This is a 45 year old female, with long-standing history of hypertension and resultant cardiomyopathy, who was noncompliant with her medications and came in with hypertensive crisis.  She also came in with mild congestive heart failure.  PLAN:  1.  Admit to ICU on Nipride drip for blood pressure control.  2.  Restart prior medications.  3.  Rule out for any myocardial damage.  4.  Echocardiogram in the morning.  5.  Consult [facility], who follows the patient.  6.  Continue diuresis with Lasix.  7.  Will continue to follow closely. Dictated by:   Colette Ribas, M.D. Attending Physician:  Darlin Priestly DD:  10/31/01 TD:  10/31/01 Job: [id number]

## 2011-03-13 NOTE — Op Note (Signed)
[name], [name]              ACCOUNT NO.:  [id number]   MEDICAL RECORD NO.:  [id number]          PATIENT TYPE:  AMB   LOCATION:  DAY                           FACILITY:  [facility]   PHYSICIAN:  Kassie Mends, M.D.      DATE OF BIRTH:  04/05/1966   DATE OF PROCEDURE:  DATE OF DISCHARGE:                                 OPERATIVE REPORT   DATE OF PROCEDURE:  August 23, 2006   PROCEDURE:  Colonoscopy.   INDICATIONS FOR EXAM:  [name] is a 45 year old female with rectal  bleeding.   FINDINGS:  Moderate internal hemorrhoids.  Otherwise, no polyps, masses,  inflammatory changes or diverticula seen. No vascular ectasias evident.   RECOMMENDATIONS:  1. High fiber diet.  Hand out given on high fiber diet and management of      hemorrhoids.  2. Follow up with Dr. [name].   MEDICATIONS:  1. Demerol 100 mg intravenous.  2. Versed 7 mg intravenous.   PROCEDURE TECHNIQUE:  Physical exam was performed.  Informed consent was  obtained from the patient and explained the benefits, risks and alternatives  to the procedure.  The patient was connected to the monitor and placed in  left lateral position.  Continuous oxygen was provided by nasal cannula and  IV medicine  administered with an indwelling cannula.  After administration,  sedation and rectal exam, the patient's rectum was intubated and the scope  was advanced under direct visualization to the cecum.  The scope was  subsequently moved slowly by carefully examining the texture, anatomy and  integrity of the mucosa on the way out.  The patient was recovered in  endoscopy suite and discharged home in satisfactory condition.      Kassie Mends, M.D.  Electronically Signed    SM/MEDQ  D:  08/24/2006  T:  08/25/2006  Job:  [id number]   cc:   [name]. [name], M.D.  Fax: [phone]

## 2011-03-13 NOTE — Discharge Summary (Signed)
[facility]  Patient:    [name], [name] Visit Number: [id number] MRN: [id number]          Service Type: MED Location: 2A A222 01 Attending Physician:  Darlin Priestly Dictated by:   Colette Ribas, F.N.P. Admit Date:  10/30/2001 Discharge Date: 11/02/2001                             Discharge Summary  PRIMARY PHYSICIAN:  Medical backup cardiologist, [facility].  DISCHARGE DIAGNOSIS:  Congestive heart failure and hypertensive crisis, resolved.  HISTORY OF PRESENT ILLNESS AND PAST MEDICAL HISTORY:  Please see admission history and physical.  HOSPITAL COURSE:  A 45 year old female with severe hypertension and resultant congestive heart failure and cardiomyopathy who presented to the emergency department in a hypertensive crisis and mild congestive heart failure due to coming off of all medications.  She had no associated cardiovascular symptomatology other than shortness of breath and the hypertension.  It was improved quickly with Nipride drip.  Once her medications were restarted the patient became normotensive quite quickly.  She was diuresed quickly as well.  On the day after admission on October 31, 2001, she felt much better and was basically back to her baseline.  Blood pressures were actually on the low side as she was still on the Nipride drip.  Otherwise vital signs looked quite stable.  LABORATORY DATA:  Labs were overall normal except for a potassium 3.3.  CPKs, MB and troponin were all negative x 3.  CONSULTATION:  [facility] was consulted.  An echocardiogram was obtained.  Prior echocardiogram in June of 2002, showed ejection fraction of 40-50%.  She was set up for MRA of the renal arteries to assess patency.  She was also started on a 24-hour urine collection.  The MRA will be done on Friday morning and the patient was ready for discharge on November 02, 2001.  She had no other cardiovascular symptomatology and  felt remarkably well. Blood pressure had remained well controlled.  A 24-hour urine finished the morning of November 02, 2001.  DISCHARGE PHYSICAL EXAMINATION:  VITAL SIGNS:  T-max 98.8, blood pressures 120-140s/70-80s.  Heart rate in the 70s.  GENERAL:  Pleasant talkative female in no acute distress.  CHEST:  Clear to auscultation bilaterally.  CARDIOVASCULAR:  Regular rate and rhythm, normal S1, S2.  Faint 2/6 systolic murmur best heard at the lower left sternal border.  ABDOMEN:  Soft, nontender.  EXTREMITIES:  No cyanosis, no edema.  ECHOCARDIOGRAM:  Pending.  DISCHARGE  MEDICATIONS: 1. Verapamil SR 240 q.d. 2. Lopressor 25 mg b.i.d. 3. Lasix 40 mg q.a.m. 4. K-Dur 10 mEq q.d.  FOLLOWUP:  Friday morning, November 04, 2001, at 9:30 for MRA of the renal arteries, in the office in 1 week.  Discharged with [facility] in 1 week.  Stressed the importance of taking all of her medications on a daily basis in 1 week.  DISCHARGE CONDITION:  Greatly improved and again reinforced the importance of keeping all of her appointments as well as taking all of her medications. Dictated by:   Colette Ribas, F.N.P. Attending Physician:  Darlin Priestly DD:  11/02/01 TD:  11/02/01 Job: [id number]

## 2011-03-13 NOTE — Discharge Summary (Signed)
NAMEJAYCELYNN, [name]              ACCOUNT NO.:  [id number]   MEDICAL RECORD NO.:  [id number]          PATIENT TYPE:  OBV   LOCATION:  A222                          FACILITY:  [facility]   PHYSICIAN:  Margaretmary Dys, M.D.DATE OF BIRTH:  1966-09-04   DATE OF ADMISSION:  05/06/2005  DATE OF DISCHARGE:  07/17/2006LH                                 DISCHARGE SUMMARY   DISCHARGE DIAGNOSES:  1.  Hypertensive urgency  2.  Congestive heart failure.  3.  Anxiety disorder.   OTHER DIAGNOSES:  1.  Obesity.  2.  Multiple admissions in past for hypertensive emergency.   DISCHARGE MEDICATIONS:  1.  Toprol XL 100 mg p.o. daily.  2.  Tarka 2/240 one tablet p.o. daily.  3.  Lasix 40 mg p.o. daily.  4.  Wellbutrin XL 300 mg 1 tablet p.o. daily.  5.  Xanax 0.5 mg p.o. t.i.d. as needed.   SPECIAL INSTRUCTIONS:  The patient is advised to return to the emergency  room if severe headache or chest pain recurs.   FOLLOW UP:  Dr. [name] in one week.  Appointment scheduled for May 18, 2005, at 2:30 p.m.   HOSPITAL COURSE:  [name] is a 45 year old African-American female with  past history of hypertension, CHF, and follows with Dr. Dietrich Pates for a heart  condition.  The patient is poorly compliant with her medications.  She  apparently went to see her doctor for followup and was found to have high  blood pressure. She also complained of some mild headache.  On admission,  her blood pressure was 197/138.  She received multiple doses of Labetalol  with only moderate control of her pressure.  At the time of admission, her  headache had resolved.  She had no nausea or vomiting, no visual changes.  She denies any recent use of cocaine.  The patient was subsequently admitted  for blood pressure control. EKG was unremarkable.   During the course of hospitalization, the patient did fairly well with no  concerns.  Blood pressure initially was resistant to treatment.  Verapamil  dose was increased.   Clonidine was also increased. On May 09, 2005, the  patient was seen.  She was complaining of visual hallucinations seeing  demons and severe dizziness.  Blood pressure had normalized.  The rest of  her vital signs were unremarkable.  There was no clear reason for this.  A  CT of the head was obtained which was negative.  Clonidine was discontinued.   When I saw her on May 11, 2005, the patient was doing very well.  She  denies any headache, no dizziness, and did not have any more hallucinations.  Her blood pressure over the last 48 hours was in satisfactory range.  The  patient was subsequently discharged to home.  Weight loss was discussed with  her, and the importance of compliance to her medications was also  emphasized.   PERTINENT LABORATORY DATA ON ADMISSION:  EKG was normal sinus rhythm with  biatrial enlargement.  The patient also had ventricular atrophy.  This was  not significant or different from  her previous.   Head CT obtained due to mental status change was negative.   Chest x-ray was negative for any active disease.   A basic metabolic profile was normal with normal BUN of 5, creatinine 1.  I  refer you to patient's admission for further details.   On discharge, it was decided that patient's main problem was poor compliance  to her medications and resistance to blood pressure medications.       AM/MEDQ  D:  05/31/2005  T:  05/31/2005  Job:  [id number]

## 2011-03-13 NOTE — Discharge Summary (Signed)
[facility]  Patient:    [name], [name]                     MRN: [id number] Adm. Date:  [id number] Disc. Date: [id number] Attending:  Learta Codding Dictator:   Brita Romp, P.A.C. CC:         Gerrit Friends. Dietrich Pates, M.D. [facility]  [facility]   Discharge Summary  DISCHARGE DIAGNOSES: 1. Hypertensive crisis, status post crack cocaine use. 2. Tobacco abuse. 3. Congestive heart failure.  HOSPITAL COURSE:  The patient presented to the emergency room at [facility] on June 6, after awakening at 4 a.m. with shortness of breath.  On presentation at [facility], her blood pressure was 211/153 with a heart rate of 117.  Initially EKG was without acute changes.  She was started on IV nitroglycerin, given IV labetolol, and also given 100 mg of Lasix IV.  On presentation to [facility], her blood pressure was 150/100.  The patient stated that her breathing had improved.  Of note at that time, her urine drug screen was positive for both THC and cocaine.  She was seen and admitted by Lewayne Bunting, M.D.  Review of systems was noted to be positive for starch and clay pica.  Admitting blood work was normal with exception of an AST of 93 and ALT 49, total bilirubin of 1.4. Later that afternoon, the patient underwent a two-dimensional echocardiogram.  Left ventricular systolic function was noted to be mildly decreased with an ejection fraction between 40 and 50%.  There was some mild diffuse left ventricular hypokinesis.  Left ventricular wall thickness was mildly to moderately increased.  There was moderate mitral regurgitation.  The left atrium was noted to mildly to moderately dilated.  The estimated peak pulmonary artery systolic pressure was moderately increased.  There was also tricuspid regurgitation.  The following day, the patient was seen by Luis Abed, M.D. [facility]  He noted that the patients  blood pressure had improved and the cardiac enzymes were serially negative for MI.  He also noted that the patient was hypokalemic with potassium of 3.3.  He felt that the main issues were fluid, salt, and medicine compliance.  On June 8, the patients blood pressure was noted to be markedly increased at 220/150.  The patient received 5 mg of Regitine IV without response.  As a result, Dr. Dietrich Pates ordered IV labetolol.  Later that day, the patient was transitioned to oral labetolol and Verapamil.  On June 9, the patient was then again seen by Dr. Myrtis Ser.  The patient stated that she was feeling better and looking forward to going home.  Dr. Myrtis Ser felt that the patients cocaine use was a probable one-time experience which she probably will not repeat.  He noted that the renal ultrasound had been performed, but they did not assess the renal arteries.  He also started the patient on a nicotine patch.  He also felt that the patient would need a full hypertension workup as an outpatient.  The following day, the patient was seen by Gerrit Friends. Dietrich Pates, M.D. [facility]  The patient felt well.  DISCHARGE MEDICATIONS: 1. Furosemide 40 mg q.d. 2. K-Dur 20 mEq q.d. 3. Labetolol 200 mg b.i.d. 4. Calan SR 240 mg q.d. 5. Nicotine patch 21 mg, change daily.  DISCHARGE INSTRUCTIONS:  The patient is advised to return to work on Monday, June 17.  She is to  follow a low fat, low cholesterol, low salt diet.  She was advised to stop smoking and not use cocaine in the future.  She is to follow up with a visit in the [place] office on June 27, at 1:15 p.m.  Next she is to contact the [facility] for a follow-up appointment and medicine check.  LABORATORY DATA:  Sodium 140, potassium 3.7, chloride 104, CO2 30, BUN 14, creatinine 1.0, glucose 117.  Total cholesterol 174, triglycerides 118, HDL 50, LDL 100, TSH 0.876.  Serum iron 85, TIBC 304, percent saturation 28, B12 478, folate 613, ferritin 68.   HIV examination was negative. DD:  04/04/01 TD:  04/04/01 Job: [id number]
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
## 2011-03-13 NOTE — Consult Note (Signed)
[name], Barbara [name]              ACCOUNT NO.:  [id number]   MEDICAL RECORD NO.:  [id number]          PATIENT TYPE:  AMB   LOCATION:  DAY                           FACILITY:  [facility]   PHYSICIAN:  Kassie Mends, M.D.      DATE OF BIRTH:  [birth year]   DATE OF CONSULTATION:  08/03/2006  DATE OF DISCHARGE:                                   CONSULTATION   REASON FOR CONSULTATION:  Rectal bleeding.   HISTORY OF PRESENT ILLNESS:  Barbara [name] is [name] 45 year old female who presents  with rectal bleeding off and on since the summer of 2007.  She reports  having [name] problem with hemorrhoids, that needed to be lanced approximately 10  years ago.  She does not think this is her hemorrhoids.  She reports  sometimes she sees bright red blood, sometimes she sees dark blood.  She has  1 formed stool [name] day.  She denies seeing black, tarry stool.  She denies any  diarrhea, nausea, vomiting, difficulty swallowing, rash, or sores in her  mouth.  She denies any abdominal pain or weight loss.  She does have back  pain.   PAST MEDICAL HISTORY:  1. Hypertension.  2. Diabetes.  3. History of congestive heart failure in 2001 secondary to hypertensive      urgency.  4. Morbid obesity.  5. History of cocaine and alcohol abuse.  6. History of tobacco abuse.  7. Depression.  8. Anxiety.   PAST SURGICAL HISTORY:  D&C.   ALLERGIES:  NO KNOWN DRUG ALLERGIES.   MEDICATIONS:  1. Diovan/HCT 80/12.5 mg daily.  2. Hydrocodone/APAP 1 p.o. ever 4-6 hours as needed.  3. Aspirin 325 mg daily.  4. Centrum multivitamin daily.  5. Glipizide 5 mg daily.  6. Lasix 40 mg daily.  7. Bupropion XL 300 mg daily.  8. Lexapro 10 mg, 1-1/2 daily.  9. Verapamil-SA 180 mg, 2 daily.  10.Nasal spray, 2 sprays daily.  11.Vytorin 10/10 at bedtime as needed.  12.Hydralazine 50 mg t.i.d.  13.Xanax 0.5 mg t.i.d. as needed.  14.Piroxicam 20 mg each morning.  15.Hydroxyzine 25 mg at bedtime.   FAMILY HISTORY:  She has no family history  of colon cancer or colon polyps.  Her father either has prostate cancer or BPH.   SOCIAL HISTORY:  She is widowed, has 2 children, ages 53 and 64.  She  stopped smoking and stopped drinking 90 days ago.  She denies any  recreational drug use.   REVIEW OF SYSTEMS:  Review of systems reveals left foot pain.  She also has  back pain.  Her review of systems is per the HPI otherwise all other systems  are negative.   PHYSICAL EXAMINATION:  VITAL SIGNS:  Weight 242.5 pounds, height 5 feet 1  inch.  BMI 45.7 (morbidly obese).  Temperature is 98.1, blood pressure  124/88, pulse 80.  GENERAL:  She is in no apparent distress, alert and oriented x4.  HEENT:  Atraumatic, normocephalic.  Pupils are equal, reactive to light.  Mouth no oral lesions. Posterior pharynx without erythema or exudate.  NECK:  Full range of motion with no lymphadenopathy.  LUNGS:  Clear to auscultation bilaterally.  CARDIOVASCULAR:  Regular rhythm, no murmur.  Normal S1 and S2.  ABDOMEN:  Bowel sounds are present.  Soft, nontender, nondistended.  No  rebound or guarding.  No hepatosplenomegaly.  Obese.  EXTREMITIES:  Without clubbing or edema.  She has trace bilateral lower  extremity edema.  NEUROLOGIC:  She has no focal neurologic deficits.   ASSESSMENT:  Barbara [name] is [name] 45 year old female with rectal bleeding, which  may be due to [name] hemorrhoid or [name] rectal polyp.  She has [name] low likelihood of  having colon cancer.  Thank you for allowing me to see Ms.  [name] in  consultation.  My recommendations follow.   RECOMMENDATIONS:  Barbara [name] should begin docusate sodium 100 mg, 2-3 times  [name] day, to soften her stool.  She should initiate Bene-Fiber once daily for 2  weeks.  She is given [name] 7-day supply in samples.  She should insert Anusol-HC  suppositories in the rectum twice daily for 2 weeks.  The goal should be to  make the stool soft but not watery.  She should be able to have bowel  movements without straining.  She is  asked to obtain [name] hemorrhoid donut from  [name] medical supply store to use for the next 2 weeks while sitting.  She will  be scheduled for [name] colonoscopy for her rectal bleeding using the TriLyte  bowel prep.  She will have [name] followup visit to see me in 6 months.   Please feel free to contact me at [phone] with additional questions.      Kassie Mends, M.D.  Electronically Signed     SM/MEDQ  D:  08/03/2006  T:  08/03/2006  Job:  [id number]   cc:   [name]. [name], M.D.  Fax: [phone]

## 2011-03-13 NOTE — H&P (Signed)
NAMEDENORA, [name]              ACCOUNT NO.:  [id number]   MEDICAL RECORD NO.:  [id number]          PATIENT TYPE:  INP   LOCATION:  ED99                          FACILITY:  [facility]   PHYSICIAN:  Margaretmary Dys, M.D.DATE OF BIRTH:  1966/07/15   DATE OF ADMISSION:  08/11/2005  DATE OF DISCHARGE:  LH                                HISTORY & PHYSICAL   PRIMARY CARE PHYSICIAN:  Syliva Overman, M.D.   ADMITTING DIAGNOSES:  1.  Congestive heart failure.  2.  Poorly controlled hypertension.  3.  Polysubstance abuse.  4.  Poorly controlled type 2 diabetes.  5.  Poor noncompliance to medications and dietary restrictions.   CHIEF COMPLAINT:  Increasing shortness of breath over the last three days.   HISTORY OF PRESENT ILLNESS:  Ms. Bumpass is a 45 year old African-American  female with a history of severe hypertension, polysubstance abuse, diabetes  mellitus, and congestive heart failure.  The patient was recently in the  hospital in August 2006 for same symptoms of congestive heart failure and  poorly controlled hypertension.  The patient told me that she has been very  poorly compliant with her medications, her Lasix and her blood pressure pill  and this seems to be all her medications she has refilled.   Over the last 2-3 days, she has noticed increased shortness of breath with  some diaphoresis.  She says she may have felt some brief chest tightness but  no classic angina pain.  The patient also reports having wheezes.  She  denies any pedal edema.  She has orthopnea and paroxysmal nocturnal dyspnea.  Denies any fevers, chills or rigors.  She has some cough with yellow, frothy  sputum.   Initial evaluation in the emergency room revealed a chest x-ray with  pulmonary vascular congestion.  The patient received Lasix 40 mg  intravenously and also received Labetalol 20 mg because of high blood  pressure, and was started on nitroglycerin.  Lopressor was also given at 5  mg IV.  The  patient is being admitted now for further management.   PAST MEDICAL HISTORY:  1.  Hypertensive urgency.  2.  Poorly controlled hypertension.  3.  History of congestive heart failure.  4.  Anxiety disorder.  5.  Polysubstance abuse.  6.  Diabetes mellitus.   MEDICATIONS:  1.  Toprol-XL 100 mg p.o. daily.  2.  Tarka 2/240 one p.o. daily.  3.  Lasix 40 mg p.o. daily.  4.  Wellbutrin XL 300 mg p.o. daily.  5.  Xanax 0.5 mg p.o. t.i.d. p.r.n.  6.  Glipizide 5 mg p.o. daily.   ALLERGIES:  She reports no known drug allergies.   SOCIAL HISTORY:  The patient lives in [place].  She is on disability  because of congestive heart failure.  Has a 15-20-pack-year history of  smoking.  Occasional alcohol use.  The patient states that she last used  cocaine three days ago.  She denies any recent marijuana use.  Denies IV  drug abuse.   FAMILY HISTORY:  Significant for hypertension, coronary artery disease, and  atherosclerotic disease.  No history  of cancers.   REVIEW OF SYSTEMS:  Ten point review of systems as mentioned in the history  of present illness above.   PHYSICAL EXAMINATION:  Conscious, alert, obese, in mild respiratory  distress.  Initial blood pressure on arrival was 211/154, pulse 85,  respiratory rate 28, oxygen saturation 100% on room air.  Pain scale 0/10.  Temperature 98.5 degrees Fahrenheit.  Blood pressure improved to 163/110  when I saw her.  HEENT:  Normocephalic, atraumatic.  Oral mucosa is moist with no exudates.  NECK:  Supple.  No JVD.  No lymphadenopathy.  LUNGS:  Bilateral crackles at the bases with some expiratory wheezes.  ABDOMEN:  Obese, soft, nontender.  Bowel sounds positive.  HEART:  S1, S2 regular.  No S3, S4 gallops or rubs.  EXTREMITIES:  Bilateral extremities pitting pedal edema.  CNS:  Grossly intact no focal deficits.   LABORATORY DATA:  Blood gas on 4 L of oxygen:  pH 7.38, PCO2 39.4, PO2 87,  bicarbonate 22.8, oxygen saturation 96.5%.  WBC  8.8, hemoglobin 16.4,  hematocrit 47.7, platelet count 234 with no left shift.  D-dimer 0.8.  Sodium 126, potassium 3.4, chloride 100, CO2 22, glucose 159, BUN 10,  creatinine 1.1.  Calcium 8.2.  Cardiac enzymes x2 negative.  A. natriuretic  peptide 1090.   ASSESSMENT/PLAN:  Ms. Tocci is a 45 year old African-American lady who is  well known to the hospital service for multiple admissions in the past for  congestive heart failure, hypertension, polysubstance abuse, and diabetes.  Diabetes has also been poorly controlled.  All of the above related to her  poor compliance to medications and dietary restrictions.  She continues to  use cocaine.   She is slightly improved after receiving intravenous metoprolol and  labetalol.  She is also receiving nitroglycerin infusion.  We will plan to  wean this off.  Will put her back on all her home medications.  I believe  there is no indication for a change as her current condition is a result of  poor compliance rather than inadequate control.   Will continue all other medications at this time.   We will institute sliding scale insulin. DVT prophylaxis with Lovenox.  GI  prophylaxis with Protonix.  Monitor  electrolytes.  I have discussed above  plan with the patient.  She verbalizes full understanding.      Margaretmary Dys, M.D.  Electronically Signed     AM/MEDQ  D:  08/11/2005  T:  08/11/2005  Job:  [id number]   cc:   [name]. [name], M.D.  Fax: [phone]

## 2011-03-13 NOTE — Procedures (Signed)
[name], [name] NO.:  [id number]   MEDICAL RECORD NO.:  [id number]          PATIENT TYPE:  INP   LOCATION:  A227                          FACILITY:  [facility]   PHYSICIAN:  Vida Roller, M.D.   DATE OF BIRTH:  [birth year]   DATE OF PROCEDURE:  08/11/2005  DATE OF DISCHARGE:                                  ECHOCARDIOGRAM   REFERRING PHYSICIAN:  Osvaldo Shipper, M.D.   TAPE NUMBER:  [id number].  Tape count [id number].   INDICATIONS:  This is a 45 year old woman with chest discomfort, shortness  of breath, hypertension and diabetes.   Technical quality of the study is adequate.   M-MODE TRACINGS:  Aorta is 32 mm.   Left atrium is 48 mm.   Septum is 18 mm.   The posterior wall is 19 mm.   Left ventricular diastolic dimension 48 mm.   Left ventricular systolic dimension 36 mm.   Two-dimensional echocardiogram and Doppler imaging:  The left ventricle is  normal size.  There is marked concentric left ventricular hypertrophy.  The  overall ejection fraction is slightly depressed at 50-55%.  There are no  obvious wall motion abnormalities.   The right ventricle is normal size with normal systolic function.   The atria are both dilated.   The aortic valve is without significant stenosis or regurgitation.   The mitral valve has mild regurgitation.   The tricuspid valve has mild regurgitation.   There is no pericardial effusion.   The inferior vena cava appears to be normal size.   Ascending aorta not well-seen.      Vida Roller, M.D.  Electronically Signed     JH/MEDQ  D:  08/11/2005  T:  08/11/2005  Job:  [id number]

## 2011-03-13 NOTE — Discharge Summary (Signed)
[name], [name]              ACCOUNT NO.:  [id number]   MEDICAL RECORD NO.:  [id number]          PATIENT TYPE:  INP   LOCATION:  A202                          FACILITY:  [facility]   PHYSICIAN:  [name], M.D.     DATE OF BIRTH:  January 29, 1966   DATE OF ADMISSION:  05/18/2005  DATE OF DISCHARGE:  LH                                 DISCHARGE SUMMARY   Patient of [name]. [name], M.D.   CARDIOLOGIST:  [name].   DISCHARGE DIAGNOSES:  1.  Newly diagnosed diabetes mellitus, type 2, non-insulin-dependent.  2.  hypertensive urgency now improved  3.  History of cocaine abuse.  4.  History of depression.  5.  History of congestive heart failure.  6.  Smoker.  7.  Obesity.   DISCHARGE MEDICATIONS:  The patient's new medications include:  1.  Clonidine 1.2 mg p.o. q.12 h.  2.  Hydralazine 75 mg q.8h.  3.  Effexor 37.5 mg daily.  4.  Isosorbide mononitrate 60 mg daily.  5.  Nicotine patch 7 mg daily.  6.  Glucotrol XL.   The patient is to continue on her old home medications which include:  1.  Tarka 2/240 daily.  2.  Lasix 40 mg daily.  3.  Wellbutrin XL 300 mg daily.  4.  Xanax 0.5 mg p.r.n.  Toprol XL has been discontinued as the patient is a regular cocaine user.   ALLERGIES:  The patient had a CLONIDINE PATCH while in the hospital which  caused a local allergic reaction with a swelling and itching at the site of  the patch.  Therefore, the patch has been discontinued and her oral  clonidine has been increased.   HOSPITAL COURSE:  This is a 45 year old African-American female who was  admitted for hypertensive urgency from Dr. Anthony [name] office.  The patient's  blood pressure was quite difficult to control.  An MRA of her abdomen was  done to rule out renal artery stenosis.  MRA results showed:  1) Negative  for renal artery stenosis. 2) Nonspecific liver lesion without the typical  imaging appearance of a cyst and slightly increase in prominence since  September 24, 2004.  Consider a dedicated liver CT or MR for further  characterization.   The patient's blood pressure finally did improve.  This was after she was  given isosorbide mononitrate.  She had already been started on high dose  Clonidine, and Hydralazine without much improvement in Blood Pressure.  It  has been noted that in past admissions that a Nipride drip had also been  effective in improving her blood pressure.  Therefore I believe continuation  of nitrates will be important to help control her blood pressure.   The patient has a history of depression. She states that Wellbutrin was not  helping her.  She states that she was taking it every single day.  Therefore, Effexor has been started.  If this does not help her within a  month or two then it can be titrated up.  I have left the Wellbutrin because  she states that she was  a 1 pack per day smoker and has cut down to 1/2 pack  per day and the Wellbutrin might have helped with this.   The patient has had a nicotine patch during her stay and this will be  continued as well.   The patient was noted to have elevated blood sugars.  Hemoglobin A1c was  done which was elevated at 6.9.  Therefore, the patient has a new diagnosis  of diabetes.  She will receive education on the type of diet to take and  education about the medication that she will be on.  I will place her on  Glucotrol XL 5 mg daily.   DISCHARGE VITALS:  Blood pressure is 153/93.  Temperature is normal at 97.3.  Pulse is 61; respiratory rate 20.  Weight is 216.   BLOOD WORK RESULTS:  WBC was 7.4, hemoglobin 14.2, hematocrit 42.9, MCV 92,  platelets 194.  Sodium 134, potassium 3.7, chloride 100, bicarb 28, glucose  163, BUN 10, creatinine 1.1, calcium 9.2, hemoglobin A1c 6.9.  A Urine drug  screen on admission was positive for cocaine.  Alcohol level was less than  5.   RADIOLOGY:  1.  A chest x-ray on admission showed stable cardiomegaly and no other  acute      findings.  2.  The patient also had an MRA -- see results mentioned above.   DIET:  An 1800 calorie ADA diet with no salt and low fat.  The patient has  been advised to try to loose weight.   DISCHARGE INSTRUCTIONS:  1.  The patient is to have a followup CAT scan to further followup the      lesion in her liver. We will make an appointment for her.  2.  The patient is to followup with [facility] for      intensive outpatient treatment to relieve her cocaine habit.  She was      evaluated by the [name] team during her stay, and an appointment was made,      but the appointment is for today at 12 o'clock, and she will most likely      miss it; therefore, we will make another      appointment for her before she leaves.  3.  The patient will receive further diabetes education as an outpatient if      she wants this.  4.  She is to followup with Dr. Lodema [name] in 1-2 weeks.       SR/MEDQ  D:  05/22/2005  T:  05/22/2005  Job:  [id number]   cc:   [name]. [name], M.D.  [address]  [place] , [place] [address]  Fax: [phone]

## 2011-03-13 NOTE — H&P (Signed)
Barbara, [name] NO.:  [id number]   MEDICAL RECORD NO.:  [id number]          PATIENT TYPE:  INP   LOCATION:  A210                          FACILITY:  [facility]   PHYSICIAN:  Vania Rea, M.D. DATE OF BIRTH:  1966-05-30   DATE OF ADMISSION:  09/23/2004  DATE OF DISCHARGE:  LH                                HISTORY & PHYSICAL   PRIMARY CARE PHYSICIAN:  Dr. Syliva Overman.   CARDIOLOGIST:  Dr. Hanna Bing.   DICTATING PHYSICIAN:  Dr. Vania Rea.   CHIEF COMPLAINT:  Uncontrolled hypertension.   HISTORY OF PRESENT ILLNESS:  This is a 45 year old African-American lady  with a history of hypertension, difficult to control congestive heart  failure, questionable history of mini strokes who visited her doctor today  for a routine physical exam and was found to have a blood pressure of  220/120.  Blood pressures were confirmed and the patient was electively  admitted for management of her hypertension, difficult to control.   The patient says that since this morning she has been having problems with  her memory. She does not seem to be able to remember who her doctors are,  and she has been having headaches since yesterday.  She says she has had  similar memory problems in the past and has been told that it is due to mini  strokes.   The patient has a history of hypertension which is difficult to control,  medical records indicate that there is some element of noncompliance.  The  patient does admit to a past history of cocaine use, and says the last use  was four months ago.  Says she does not usually have chest pains, but has  been having some sort of chest heaviness since today.   The patient says her vision has been decreasing for the past two years.  She  has chronic orthopnea and chronic dyspnea on exertion.  She does not usually  have nausea and vomiting, or diarrhea, but she has been nauseous since this  morning and had two episodes of  diarrhea yesterday.  She does have a history  of gout and has recently been treated with a tapered course of Indomethacin  25 mg.   PAST MEDICAL HISTORY:  1.  Hypertension, difficult to control.  2.  Congestive heart failure.  3.  h/o Gout.  4.  Childhood asthma.  5.  h/o seasonal allergies.  6.  Obesity.  7.  History of tobacco, alcohol and street drug use.   MEDICATIONS:  1.  Benicar 20 mg daily.  2.  Toprol XL 10 mg daily.  3.  Tarka 2/240 daily.  4.  Lasix 40 mg daily.  5.  Zyrtec 10 mg daily.  6.  Flonase 2 puffs daily.  7.  Recently completed a course of Indocin.  8.  Also takes Nicotrol inhaler.   PAST SURGICAL HISTORY:  None.  Had a D&C in 1988.   ALLERGIES:  NO KNOWN DRUG ALLERGIES.   SOCIAL HISTORY:  Disabled, widow, said she smoked one pack a day until the  past one month  where she is using a Nicotrol inhaler.  Prior to that 20-pack-  year smoking history.  She does have a history of alcohol and cocaine  addiction.  She says she has not used cocaine for the past four months.   FAMILY HISTORY:  Significant for mother who died at age 37 from a heart  attack.  Father with hypertension and is also status post CABG.  She has  three siblings whose health status is unknown.   REVIEW OF SYSTEMS:  Other than outlined in the H&P her only other  significant is right shoulder pain and swelling of left ankle chronic  swelling.  She did have left great toe swelling, but that has resolved with  Indocin.  Her last menstrual period was one week ago and she had menorrhagia  which is her normal.   PHYSICAL EXAMINATION:  This is a very pleasant young African-American lady  lying in bed in no acute distress.  VITAL SIGNS:  Her temperature is 96.3,  pulse 60, blood pressure on initial presentation to the ward it was recorded  as 220/160, however after eating lunch without any treatment her blood  pressure is now 190/150.  Pupils are round, equal and reactive.  CHEST:  Clear to  auscultation bilaterally.  CARDIOVASCULAR:  Regular rhythm without murmur.  ABDOMEN:  Obese, soft and nontender.  No masses are felt.  EXTREMITIES:  Without edema. Left ankle, swollen  non-tender.   LABS:  Pending at the time of initial exam.   ASSESSMENT:  Hypertensive urgency with some apparent memory impairment;  possibly hypertensive encephalopathy.  Nevertheless the patient does not  show any other signs of distress.  h/o gout with chronically swollen left ankle;  tobacco addiction - will use nicotine patch  H/o cocaine abuse   PLAN:  We will give her hydralazine every two hours to try to get her  systolic down to 161.  We will check a urine drug screen before starting  beta-blockers.  We will continue ACE inhibitors and Lasix and we will also  give her a dose of Norvasc.  We will get an BNP, we will get an MRI of the  brain and an MRA of the kidneys.    Leop  LC/MEDQ  D:  09/23/2004  T:  09/23/2004  Job:  [id number]

## 2011-03-16 ENCOUNTER — Encounter: Payer: Self-pay | Admitting: Family Medicine

## 2011-03-16 ENCOUNTER — Ambulatory Visit (INDEPENDENT_AMBULATORY_CARE_PROVIDER_SITE_OTHER): Payer: Medicare Other | Admitting: Family Medicine

## 2011-03-16 ENCOUNTER — Other Ambulatory Visit: Payer: Self-pay | Admitting: Family Medicine

## 2011-03-16 VITALS — BP 160/120 | HR 95 | Resp 16 | Ht 59.0 in | Wt 239.8 lb

## 2011-03-16 DIAGNOSIS — E119 Type 2 diabetes mellitus without complications: Secondary | ICD-10-CM

## 2011-03-16 DIAGNOSIS — I10 Essential (primary) hypertension: Secondary | ICD-10-CM

## 2011-03-16 DIAGNOSIS — Z139 Encounter for screening, unspecified: Secondary | ICD-10-CM

## 2011-03-16 DIAGNOSIS — E785 Hyperlipidemia, unspecified: Secondary | ICD-10-CM

## 2011-03-16 MED ORDER — CLONIDINE HCL 0.3 MG PO TABS: 0.3000 mg | ORAL_TABLET | Freq: Two times a day (BID) | ORAL | Status: DC

## 2011-03-16 MED ORDER — LOSARTAN POTASSIUM 100 MG PO TABS: 100.0000 mg | ORAL_TABLET | Freq: Every day | ORAL | Status: DC

## 2011-03-16 NOTE — Patient Instructions (Signed)
F/u in 3 to 4 weeks.  Your blood pressure is very high, new med today for blood pressure, and increased dose of clonidine to twice  Daily. SPACE the clonidine to `12 hrs apart, 10am and 10pm  Labs today  pls sched ypur mammogram, it will be scheduled before you leave.

## 2011-03-17 LAB — COMPLETE METABOLIC PANEL WITH GFR
ALT: 16 U/L (ref 0–35)
AST: 10 U/L (ref 0–37)
Albumin: 3.8 g/dL (ref 3.5–5.2)
Alkaline Phosphatase: 113 U/L (ref 39–117)
BUN: 12 mg/dL (ref 6–23)
CO2: 23 mEq/L (ref 19–32)
Calcium: 9.3 mg/dL (ref 8.4–10.5)
Chloride: 102 mEq/L (ref 96–112)
Creat: 0.83 mg/dL (ref 0.40–1.20)
GFR, Est African American: >60 mL/min (ref >60)
GFR, Est Non African American: >60 mL/min (ref >60)
Glucose, Bld: 350 mg/dL — ABNORMAL HIGH (ref 70–99)
Potassium: 4 mEq/L (ref 3.5–5.3)
Sodium: 137 mEq/L (ref 135–145)
Total Bilirubin: 0.5 mg/dL (ref 0.3–1.2)
Total Protein: 6.4 g/dL (ref 6.0–8.3)

## 2011-03-17 LAB — MICROALBUMIN / CREATININE URINE RATIO
Creatinine, Urine: 78.8 mg/dL
Microalb Creat Ratio: 268.7 mg/g — ABNORMAL HIGH (ref 0.0–30.0)
Microalb, Ur: 21.17 mg/dL — ABNORMAL HIGH (ref 0.00–1.89)

## 2011-03-17 LAB — HEMOGLOBIN A1C
Hgb A1c MFr Bld: 12 % — ABNORMAL HIGH (ref <5.7)
Mean Plasma Glucose: 298 mg/dL — ABNORMAL HIGH (ref <117)

## 2011-03-17 LAB — LIPID PANEL
Cholesterol: 185 mg/dL (ref 0–200)
HDL: 36 mg/dL — ABNORMAL LOW (ref >39)
Total CHOL/HDL Ratio: 5.1 Ratio
Triglycerides: 471 mg/dL — ABNORMAL HIGH (ref <150)

## 2011-03-19 ENCOUNTER — Ambulatory Visit (HOSPITAL_COMMUNITY): Payer: Medicare Other

## 2011-03-22 NOTE — Assessment & Plan Note (Signed)
Medication compliance addressed. Commitment to regular exercise and healthy  food choices, with portion control discussed. DASH diet and low fat diet discussed and literature offered. Changes in medication made at this visit. Increase the lovastatin 40 mg to two at night, pt to be notified

## 2011-03-22 NOTE — Assessment & Plan Note (Signed)
Uncontrolled and deteriorating , willl refer to endo for assistance , pt to be notified

## 2011-03-22 NOTE — Progress Notes (Signed)
  Subjective:    Patient ID: [name], female    DOB: [birth year], 45 y.o.   MRN: [id number]  HPI The PT is here for follow up and re-evaluation of chronic medical conditions, medication management and review of recent lab and radiology data.  Preventive health is updated, specifically  Cancer screening, and Immunization.   Questions or concerns regarding consultations or procedures which the PT has had in the interim are  addressed. The PT denies any adverse reactions to current medications since the last visit.  There are no new concerns.  There are no specific complaints       Review of Systems Denies recent fever or chills. Denies sinus pressure, nasal congestion, ear pain or sore throat. Denies chest congestion, productive cough or wheezing. Denies chest pains, palpitations, paroxysmal nocturnal dyspnea, orthopnea and leg swelling Denies abdominal pain, nausea, vomiting,diarrhea or constipation.  Denies rectal bleeding or change in bowel movement. Denies dysuria, frequency, hesitancy or incontinence. Denies joint pain, swelling and limitation in mobility. Denies headaches, seizure, numbness, or tingling. Denies uncontrolled  depression, anxiety or insomnia. Denies skin break down or rash. Reports blood sugars have improved, she does not have her meter or log book. Denies polyuria, polydypsia or blurred vision, and is back on byetta.        Objective:   Physical Exam Patient alert and oriented .  HEENT: No facial asymmetry, EOMI, no sinus tenderness, TM's clear, Oropharynx pink and moist.  Neck supple no adenopathy.  Chest: Clear to auscultation bilaterally.  CVS: S1, S2 no murmurs, no S3.  ABD: Soft non tender. Bowel sounds normal.  Ext: No edema  MS: Adequate ROM spine, shoulders, hips and knees.  Skin: Intact, no ulcerations or rash noted.  Psych: Good eye contact, normal affect. Memory intact not anxious or depressed appearing.  CNS: CN 2-12 intact,  power, tone and sensation normal throughout.        Assessment & Plan:

## 2011-03-22 NOTE — Assessment & Plan Note (Signed)
Medication compliance addressed. Commitment to regular exercise and healthy  food choices, with portion control discussed. DASH diet and low fat diet discussed and literature offered. Changes in medication made at this visit.  

## 2011-03-26 ENCOUNTER — Ambulatory Visit (HOSPITAL_COMMUNITY)
Admission: RE | Admit: 2011-03-26 | Discharge: 2011-03-26 | Disposition: A | Discharge status: Home / Self Care | Payer: Medicare Other | Source: Ambulatory Visit | Attending: Family Medicine | Admitting: Family Medicine

## 2011-03-26 DIAGNOSIS — Z139 Encounter for screening, unspecified: Secondary | ICD-10-CM

## 2011-03-26 DIAGNOSIS — Z1231 Encounter for screening mammogram for malignant neoplasm of breast: Secondary | ICD-10-CM | POA: Insufficient documentation

## 2011-03-30 ENCOUNTER — Other Ambulatory Visit: Payer: Self-pay

## 2011-03-30 ENCOUNTER — Other Ambulatory Visit: Payer: Self-pay | Admitting: Family Medicine

## 2011-03-30 ENCOUNTER — Telehealth: Payer: Self-pay | Admitting: Family Medicine

## 2011-03-30 MED ORDER — ALPRAZOLAM 0.5 MG PO TABS: ORAL_TABLET | ORAL | Status: DC

## 2011-04-01 NOTE — Telephone Encounter (Signed)
error 

## 2011-04-06 ENCOUNTER — Telehealth: Payer: Self-pay | Admitting: Family Medicine

## 2011-04-06 NOTE — Telephone Encounter (Signed)
Flexeril 10mg  one dily as needed #30 refill 1, pls erx and let her know muscle relaxant prescribed which may help

## 2011-04-06 NOTE — Telephone Encounter (Signed)
Called patient, left message.

## 2011-04-06 NOTE — Telephone Encounter (Signed)
Left patient message regarding diabetic shoes

## 2011-04-07 MED ORDER — CYCLOBENZAPRINE HCL 10 MG PO TABS: 10.0000 mg | ORAL_TABLET | Freq: Every day | ORAL | Status: DC | PRN

## 2011-04-07 NOTE — Telephone Encounter (Signed)
Addended by: Adella Hare B on: 04/07/2011 11:39 AM   Modules accepted: Orders

## 2011-04-07 NOTE — Telephone Encounter (Signed)
Med sent, patient aware 

## 2011-04-08 ENCOUNTER — Inpatient Hospital Stay (HOSPITAL_COMMUNITY)
Admission: EM | Admit: 2011-04-08 | Discharge: 2011-04-10 | DRG: 638 | Disposition: A | Discharge status: Home / Self Care | Payer: Medicare Other | Attending: Internal Medicine | Admitting: Internal Medicine

## 2011-04-08 ENCOUNTER — Telehealth: Payer: Self-pay | Admitting: Family Medicine

## 2011-04-08 ENCOUNTER — Emergency Department (HOSPITAL_COMMUNITY): Payer: Medicare Other

## 2011-04-08 DIAGNOSIS — D72829 Elevated white blood cell count, unspecified: Secondary | ICD-10-CM | POA: Diagnosis present

## 2011-04-08 DIAGNOSIS — F17200 Nicotine dependence, unspecified, uncomplicated: Secondary | ICD-10-CM | POA: Diagnosis present

## 2011-04-08 DIAGNOSIS — E875 Hyperkalemia: Secondary | ICD-10-CM | POA: Diagnosis present

## 2011-04-08 DIAGNOSIS — D751 Secondary polycythemia: Secondary | ICD-10-CM | POA: Diagnosis present

## 2011-04-08 DIAGNOSIS — N179 Acute kidney failure, unspecified: Secondary | ICD-10-CM | POA: Diagnosis present

## 2011-04-08 DIAGNOSIS — Z794 Long term (current) use of insulin: Secondary | ICD-10-CM

## 2011-04-08 DIAGNOSIS — E1310 Other specified diabetes mellitus with ketoacidosis without coma: Principal | ICD-10-CM | POA: Diagnosis present

## 2011-04-08 DIAGNOSIS — E871 Hypo-osmolality and hyponatremia: Secondary | ICD-10-CM | POA: Diagnosis present

## 2011-04-08 LAB — DIFFERENTIAL
Basophils Absolute: 0 10*3/uL (ref 0.0–0.1)
Basophils Relative: 0 % (ref 0–1)
Eosinophils Absolute: 0 10*3/uL (ref 0.0–0.7)
Eosinophils Relative: 0 % (ref 0–5)
Lymphocytes Relative: 13 % (ref 12–46)
Lymphs Abs: 1.5 10*3/uL (ref 0.7–4.0)
Monocytes Absolute: 0.8 10*3/uL (ref 0.1–1.0)
Monocytes Relative: 7 % (ref 3–12)
Neutro Abs: 8.9 10*3/uL — ABNORMAL HIGH (ref 1.7–7.7)
Neutrophils Relative %: 79 % — ABNORMAL HIGH (ref 43–77)

## 2011-04-08 LAB — BASIC METABOLIC PANEL
BUN: 58 mg/dL — ABNORMAL HIGH (ref 6–23)
BUN: 58 mg/dL — ABNORMAL HIGH (ref 6–23)
BUN: 52 mg/dL — ABNORMAL HIGH (ref 6–23)
CO2: 24 mEq/L (ref 19–32)
CO2: 24 mEq/L (ref 19–32)
CO2: 24 mEq/L (ref 19–32)
Calcium: 9.9 mg/dL (ref 8.4–10.5)
Calcium: 9.9 mg/dL (ref 8.4–10.5)
Calcium: 9.4 mg/dL (ref 8.4–10.5)
Chloride: 92 mEq/L — ABNORMAL LOW (ref 96–112)
Chloride: 94 mEq/L — ABNORMAL LOW (ref 96–112)
Chloride: 97 mEq/L (ref 96–112)
Creatinine, Ser: 2.49 mg/dL — ABNORMAL HIGH (ref 0.4–1.2)
Creatinine, Ser: 2.33 mg/dL — ABNORMAL HIGH (ref 0.4–1.2)
Creatinine, Ser: 1.77 mg/dL — ABNORMAL HIGH (ref 0.4–1.2)
GFR calc Af Amer: 25 mL/min — ABNORMAL LOW (ref >60)
GFR calc Af Amer: 27 mL/min — ABNORMAL LOW (ref >60)
GFR calc Af Amer: 37 mL/min — ABNORMAL LOW (ref >60)
GFR calc non Af Amer: 21 mL/min — ABNORMAL LOW (ref >60)
GFR calc non Af Amer: 22 mL/min — ABNORMAL LOW (ref >60)
GFR calc non Af Amer: 31 mL/min — ABNORMAL LOW (ref >60)
Glucose, Bld: 339 mg/dL — ABNORMAL HIGH (ref 70–99)
Glucose, Bld: 270 mg/dL — ABNORMAL HIGH (ref 70–99)
Glucose, Bld: 141 mg/dL — ABNORMAL HIGH (ref 70–99)
Potassium: 4.6 mEq/L (ref 3.5–5.1)
Potassium: 4.5 mEq/L (ref 3.5–5.1)
Potassium: 3.8 mEq/L (ref 3.5–5.1)
Sodium: 129 mEq/L — ABNORMAL LOW (ref 135–145)
Sodium: 131 mEq/L — ABNORMAL LOW (ref 135–145)
Sodium: 133 mEq/L — ABNORMAL LOW (ref 135–145)

## 2011-04-08 LAB — URINALYSIS, ROUTINE W REFLEX MICROSCOPIC
Bilirubin Urine: NEGATIVE
Glucose, UA: 500 mg/dL — AB
Ketones, ur: NEGATIVE mg/dL
Leukocytes, UA: NEGATIVE
Nitrite: NEGATIVE
Specific Gravity, Urine: 1.025 (ref 1.005–1.030)
Urobilinogen, UA: 0.2 mg/dL (ref 0.0–1.0)
pH: 5 (ref 5.0–8.0)

## 2011-04-08 LAB — MRSA PCR SCREENING: MRSA by PCR: NEGATIVE

## 2011-04-08 LAB — GLUCOSE, CAPILLARY
Glucose-Capillary: 418 mg/dL — ABNORMAL HIGH (ref 70–99)
Glucose-Capillary: 385 mg/dL — ABNORMAL HIGH (ref 70–99)
Glucose-Capillary: 372 mg/dL — ABNORMAL HIGH (ref 70–99)
Glucose-Capillary: 291 mg/dL — ABNORMAL HIGH (ref 70–99)
Glucose-Capillary: 180 mg/dL — ABNORMAL HIGH (ref 70–99)
Glucose-Capillary: 152 mg/dL — ABNORMAL HIGH (ref 70–99)
Glucose-Capillary: 120 mg/dL — ABNORMAL HIGH (ref 70–99)
Glucose-Capillary: 162 mg/dL — ABNORMAL HIGH (ref 70–99)

## 2011-04-08 LAB — COMPREHENSIVE METABOLIC PANEL
ALT: 13 U/L (ref 0–35)
AST: 13 U/L (ref 0–37)
Albumin: 3.9 g/dL (ref 3.5–5.2)
Alkaline Phosphatase: 117 U/L (ref 39–117)
BUN: 64 mg/dL — ABNORMAL HIGH (ref 6–23)
CO2: 23 mEq/L (ref 19–32)
Calcium: 10.7 mg/dL — ABNORMAL HIGH (ref 8.4–10.5)
Chloride: 82 mEq/L — ABNORMAL LOW (ref 96–112)
Creatinine, Ser: 3.04 mg/dL — ABNORMAL HIGH (ref 0.4–1.2)
GFR calc Af Amer: 20 mL/min — ABNORMAL LOW (ref >60)
GFR calc non Af Amer: 17 mL/min — ABNORMAL LOW (ref >60)
Glucose, Bld: 641 mg/dL (ref 70–99)
Potassium: 5.2 mEq/L — ABNORMAL HIGH (ref 3.5–5.1)
Sodium: 124 mEq/L — ABNORMAL LOW (ref 135–145)
Total Bilirubin: 0.6 mg/dL (ref 0.3–1.2)
Total Protein: 8.1 g/dL (ref 6.0–8.3)

## 2011-04-08 LAB — URINE MICROSCOPIC-ADD ON

## 2011-04-08 LAB — LIPASE, BLOOD: Lipase: 22 U/L (ref 11–59)

## 2011-04-08 LAB — CBC
HCT: 50.3 % — ABNORMAL HIGH (ref 36.0–46.0)
Hemoglobin: 17.6 g/dL — ABNORMAL HIGH (ref 12.0–15.0)
MCH: 32.2 pg (ref 26.0–34.0)
MCHC: 35 g/dL (ref 30.0–36.0)
MCV: 92 fL (ref 78.0–100.0)
Platelets: 242 10*3/uL (ref 150–400)
RBC: 5.47 MIL/uL — ABNORMAL HIGH (ref 3.87–5.11)
RDW: 14.5 % (ref 11.5–15.5)
WBC: 11.2 10*3/uL — ABNORMAL HIGH (ref 4.0–10.5)

## 2011-04-08 LAB — AMYLASE: Amylase: 65 U/L (ref 0–105)

## 2011-04-08 NOTE — Telephone Encounter (Signed)
noted 

## 2011-04-09 LAB — CBC
HCT: 47.6 % — ABNORMAL HIGH (ref 36.0–46.0)
Hemoglobin: 16.1 g/dL — ABNORMAL HIGH (ref 12.0–15.0)
MCH: 31.6 pg (ref 26.0–34.0)
MCHC: 33.8 g/dL (ref 30.0–36.0)
MCV: 93.5 fL (ref 78.0–100.0)
Platelets: 210 10*3/uL (ref 150–400)
RBC: 5.09 MIL/uL (ref 3.87–5.11)
RDW: 14.8 % (ref 11.5–15.5)
WBC: 9.1 10*3/uL (ref 4.0–10.5)

## 2011-04-09 LAB — GLUCOSE, CAPILLARY
Glucose-Capillary: 119 mg/dL — ABNORMAL HIGH (ref 70–99)
Glucose-Capillary: 135 mg/dL — ABNORMAL HIGH (ref 70–99)
Glucose-Capillary: 141 mg/dL — ABNORMAL HIGH (ref 70–99)
Glucose-Capillary: 143 mg/dL — ABNORMAL HIGH (ref 70–99)
Glucose-Capillary: 175 mg/dL — ABNORMAL HIGH (ref 70–99)
Glucose-Capillary: 195 mg/dL — ABNORMAL HIGH (ref 70–99)
Glucose-Capillary: 202 mg/dL — ABNORMAL HIGH (ref 70–99)
Glucose-Capillary: 218 mg/dL — ABNORMAL HIGH (ref 70–99)
Glucose-Capillary: 232 mg/dL — ABNORMAL HIGH (ref 70–99)
Glucose-Capillary: 261 mg/dL — ABNORMAL HIGH (ref 70–99)
Glucose-Capillary: 272 mg/dL — ABNORMAL HIGH (ref 70–99)

## 2011-04-09 LAB — BASIC METABOLIC PANEL
BUN: 40 mg/dL — ABNORMAL HIGH (ref 6–23)
BUN: 36 mg/dL — ABNORMAL HIGH (ref 6–23)
CO2: 23 mEq/L (ref 19–32)
CO2: 25 mEq/L (ref 19–32)
Calcium: 9.6 mg/dL (ref 8.4–10.5)
Calcium: 9.7 mg/dL (ref 8.4–10.5)
Chloride: 100 mEq/L (ref 96–112)
Chloride: 99 mEq/L (ref 96–112)
Creatinine, Ser: 1.36 mg/dL — ABNORMAL HIGH (ref 0.4–1.2)
Creatinine, Ser: 1.24 mg/dL — ABNORMAL HIGH (ref 0.4–1.2)
GFR calc Af Amer: 51 mL/min — ABNORMAL LOW (ref >60)
GFR calc Af Amer: 56 mL/min — ABNORMAL LOW (ref >60)
GFR calc non Af Amer: 42 mL/min — ABNORMAL LOW (ref >60)
GFR calc non Af Amer: 47 mL/min — ABNORMAL LOW (ref >60)
Glucose, Bld: 122 mg/dL — ABNORMAL HIGH (ref 70–99)
Glucose, Bld: 119 mg/dL — ABNORMAL HIGH (ref 70–99)
Potassium: 3.3 mEq/L — ABNORMAL LOW (ref 3.5–5.1)
Potassium: 3.7 mEq/L (ref 3.5–5.1)
Sodium: 136 mEq/L (ref 135–145)
Sodium: 135 mEq/L (ref 135–145)

## 2011-04-09 LAB — T4, FREE: Free T4: 1.2 ng/dL (ref 0.80–1.80)

## 2011-04-09 LAB — HEMOGLOBIN A1C
Hgb A1c MFr Bld: 12.6 % — ABNORMAL HIGH (ref <5.7)
Mean Plasma Glucose: 315 mg/dL — ABNORMAL HIGH (ref <117)

## 2011-04-09 LAB — TSH: TSH: 0.392 u[IU]/mL (ref 0.350–4.500)

## 2011-04-10 ENCOUNTER — Emergency Department (HOSPITAL_COMMUNITY)
Admission: EM | Admit: 2011-04-10 | Discharge: 2011-04-11 | Disposition: A | Discharge status: Home / Self Care | Payer: Medicare Other | Attending: Emergency Medicine | Admitting: Emergency Medicine

## 2011-04-10 DIAGNOSIS — I509 Heart failure, unspecified: Secondary | ICD-10-CM | POA: Insufficient documentation

## 2011-04-10 DIAGNOSIS — I10 Essential (primary) hypertension: Secondary | ICD-10-CM | POA: Insufficient documentation

## 2011-04-10 DIAGNOSIS — R7309 Other abnormal glucose: Secondary | ICD-10-CM | POA: Insufficient documentation

## 2011-04-10 DIAGNOSIS — F17200 Nicotine dependence, unspecified, uncomplicated: Secondary | ICD-10-CM | POA: Insufficient documentation

## 2011-04-10 DIAGNOSIS — Z794 Long term (current) use of insulin: Secondary | ICD-10-CM | POA: Insufficient documentation

## 2011-04-10 LAB — POCT I-STAT, CHEM 8
BUN: 15 mg/dL (ref 6–23)
Calcium, Ion: 1.25 mmol/L (ref 1.12–1.32)
Chloride: 104 mEq/L (ref 96–112)
Creatinine, Ser: 0.9 mg/dL (ref 0.50–1.10)
Glucose, Bld: 362 mg/dL — ABNORMAL HIGH (ref 70–99)
HCT: 50 % — ABNORMAL HIGH (ref 36.0–46.0)
Hemoglobin: 17 g/dL — ABNORMAL HIGH (ref 12.0–15.0)
Potassium: 5.3 mEq/L — ABNORMAL HIGH (ref 3.5–5.1)
Sodium: 133 mEq/L — ABNORMAL LOW (ref 135–145)
TCO2: 20 mmol/L (ref 0–100)

## 2011-04-10 LAB — CBC
HCT: 47.5 % — ABNORMAL HIGH (ref 36.0–46.0)
Hemoglobin: 15.6 g/dL — ABNORMAL HIGH (ref 12.0–15.0)
MCH: 31.1 pg (ref 26.0–34.0)
MCHC: 32.8 g/dL (ref 30.0–36.0)
MCV: 94.6 fL (ref 78.0–100.0)
Platelets: 175 10*3/uL (ref 150–400)
RBC: 5.02 MIL/uL (ref 3.87–5.11)
RDW: 15 % (ref 11.5–15.5)
WBC: 5.8 10*3/uL (ref 4.0–10.5)

## 2011-04-10 LAB — BASIC METABOLIC PANEL
BUN: 17 mg/dL (ref 6–23)
CO2: 21 mEq/L (ref 19–32)
Calcium: 9.7 mg/dL (ref 8.4–10.5)
Chloride: 102 mEq/L (ref 96–112)
Creatinine, Ser: 0.89 mg/dL (ref 0.50–1.10)
GFR calc Af Amer: >60 mL/min (ref >60)
GFR calc non Af Amer: >60 mL/min (ref >60)
Glucose, Bld: 320 mg/dL — ABNORMAL HIGH (ref 70–99)
Potassium: 4.9 mEq/L (ref 3.5–5.1)
Sodium: 131 mEq/L — ABNORMAL LOW (ref 135–145)

## 2011-04-10 LAB — GLUCOSE, CAPILLARY
Glucose-Capillary: 237 mg/dL — ABNORMAL HIGH (ref 70–99)
Glucose-Capillary: 254 mg/dL — ABNORMAL HIGH (ref 70–99)
Glucose-Capillary: 304 mg/dL — ABNORMAL HIGH (ref 70–99)
Glucose-Capillary: 444 mg/dL — ABNORMAL HIGH (ref 70–99)

## 2011-04-11 LAB — GLUCOSE, CAPILLARY: Glucose-Capillary: 243 mg/dL — ABNORMAL HIGH (ref 70–99)

## 2011-04-12 NOTE — Discharge Summary (Signed)
[name], QUINTEROS NO.:  [id number]  MEDICAL RECORD NO.:  [id number]  LOCATION:  A206                          FACILITY:  [facility]  PHYSICIAN:  Elliot Cousin, M.D.    DATE OF BIRTH:  Nov 18, 1965  DATE OF ADMISSION:  04/08/2011 DATE OF DISCHARGE:  06/15/2012LH                              DISCHARGE SUMMARY   DISCHARGE DIAGNOSES: 1. Type 2 diabetes mellitus with diabetic ketoacidosis by     elevated anion gap. 2. Historically uncontrolled type 2 diabetes mellitus.  The patient's     hemoglobin A1c was 12.6. 3. Acute renal failure secondary to prerenal azotemia. 4. Hyponatremia secondary to hypovolemia. 5. Secondary polycythemia, secondary to volume depletion/hypovolemia. 6. Mild hyperkalemia. 7. Leukocytosis with no evidence of infection.  Resolved at the time     of discharge. 8. History of malignant hypertension with relatively low normal blood     pressures during the hospitalization. 9. Tobacco abuse.  The patient was advised to quit.  DISCHARGE MEDICATIONS: 1. Lantus insulin SoloSTAR pen 60 units at bedtime (new medication). 2. NovoLog pen 20 units subcu with each meal 3 times daily. 3. Amlodipine 10 mg daily. 4. Aspirin 81 mg daily. 5. Byetta 5 mcg 1 injection b.i.d. 6. Clonidine 0.3 mg at bedtime. 7. Cyclobenzaprine 10 mg at bedtime. 8. Flonase 2 sprays daily. 9. Hydroxyzine 50 mg daily as needed. 10.Lisinopril 40 mg daily. 11.Lovastatin 40 mg 2 tablets at bedtime. 12.Metformin 1000 mg b.i.d. 13.Omeprazole 20 mg daily. 14.Paroxetine 20 mg daily. 15.Tramadol 50 mg daily. 16.Xanax 0.5 mg 3 times daily. 17.Zyrtec 10 mg daily. 18.Stop ibuprofen. 19.Stop potassium chloride until you see Dr. [name]. 20.Stop Maxzide until you see Dr. [name]. 21.Stop glipizide. 22.Stop furosemide until you see Dr. [name].  DISCHARGE DISPOSITION:  The patient was discharged to home in improved and stable condition on April 10, 2011.  She has an appointment to  follow up with endocrinologist Dr. Fransico Him on June 20 at 12 noon.  She was advised to follow up with Dr. [name] in 3-5 days.  CONSULTATIONS:  Barbara NIDA, MD  PROCEDURES PERFORMED:  Chest x-ray on April 08, 2011.  The results revealed no acute active cardiopulmonary disease.  HISTORY OF PRESENT ILLNESS:  The patient is a 45 year old woman with a past medical history significant for type 2 diabetes mellitus, obesity, hypertension, and depression.  She presented to the emergency department on April 08, 2011 with a chief complaint of generalized weakness.  In the emergency department, the patient was noted to be afebrile, mildly tachycardic, and relatively hypotensive given her history of malignant hypertension.  Her blood pressure was 94/72.  Her serum sodium was 124, serum potassium 5.2, CO2 23, BUN 64, creatinine 3.04, and glucose 641.  Her WBC was 11.2 and her hemoglobin was 17.6.  She was admitted for further evaluation and management.  HOSPITAL COURSE: 1. UNCONTROLLED TYPE 2 DIABETES MELLITUS WITH DIABETIC KETOACIDOSIS.     Although the patient's CO2 was within normal limits, her anion gap     was 19.  For treatment, she was started on the Glucommander insulin     drip protocol.  Her capillary blood glucose was monitored according  to the protocol.  Her electrolytes were monitored every 4 hours for     the first 24 hours.  Once her anion gap closed and her venous     glucose/capillary blood glucose improved, the insulin drip was     discontinued.  Endocrinologist Dr. Fransico Him was consulted.  After the insulin drip was discontinued, he ordered NPH insulin 20 units for 1 dose and then started her on Lantus, sliding scale NovoLog, and 20 units of NovoLog 3 times daily with meals.  Her hemoglobin A1c was noted to be 12.6 indicating poor outpatient control.  The registered dietitian/nutritionist was consulted to provide the patient with a review of portion sizes, meal planning, and  overall information on carbohydrate-modified meal planning.  She was receptive.  Prior to discharge, Dr. Fransico Him recommended that metformin be resumed at 1000 mg b.i.d.  He also restarted Byetta at 5 mcg b.i.d.  Glipizide was discontinued prior to this hospitalization. The patient will follow up with Dr. Fransico Him next week as scheduled.  She was advised to be compliant with meal planning and medication therapy. 2. ACUTE RENAL FAILURE, HYPONATREMIA, AND HYPERKALEMIA.  The patient's     BUN was 64 and her creatinine was 3.04 on admission.  Her sodium     was 124 and her potassium was 5.2.  She was started on IV fluid     volume repletion with normal saline.  Her serum sodium improved to     135.  Following the discontinuation of IV fluids, her serum sodium     fell slightly to 131 prior to discharge.  Her serum potassium     improved to 4.9.  Her BUN improved to 17 and her creatinine     normalized at 0.89.  It was obvious that the patient had     hypovolemic hyponatremia and acute renal failure as a consequence     of volume depletion/prerenal azotemia. 3. HISTORY OF HYPERTENSION with relatively low normal blood pressures.     The patient's blood pressure ranged from the lower 90s to the lower     100s during the majority of hospitalization.  Prior to discharge,     her systolic blood pressure did improve to 125.  All of her     antihypertensive medications were withheld with the exception of     clonidine.  The dose of clonidine was decreased slightly, however.     The patient's relative hypotension was secondary to hypovolemia.     Upon discharge, she was advised to restart clonidine, lisinopril,     and amlodipine.  She was instructed to discontinue Maxzide and     furosemide until she is reevaluated by Dr. [name].  She voiced     understanding. 4. REACTIVE LEUKOCYTOSIS AND SECONDARY POLYCYTHEMIA.  The patient's     WBC was 11.2 on admission.  Her urinalysis was not indicative of a      urinary tract infection.  Her chest x-ray revealed no evidence of     infection.  Her white blood cell count normalized prior to     discharge.  It was likely that her leukocytosis was reactive as a     consequence of DKA.  Her hemoglobin was 17.6 on admission.  It     improved to 15.6 at the time of hospital discharge.  The secondary     polycythemia was secondary to volume contraction.  The patient was     instructed to increase her intake of fluids prior  to discharge.  The patient's thyroid function was assessed.  Her free T4 was within normal limits at 1.2 and her TSH was within normal limits at 0.39.     Elliot Cousin, M.D.     DF/MEDQ  D:  04/10/2011  T:  04/11/2011  Job:  [id number]  cc:   Purcell Nails, MD Fax: [phone]  [name]. [name], M.D. Fax: [phone]  Electronically Signed by Elliot Cousin M.D. on 04/12/2011 06:44:20 PM

## 2011-04-12 NOTE — H&P (Signed)
[name], CAPURRO NO.:  [id number]  MEDICAL RECORD NO.:  [id number]  LOCATION:  IC06                          FACILITY:  [facility]  PHYSICIAN:  Elliot Cousin, M.D.    DATE OF BIRTH:  01-Aug-1966  DATE OF ADMISSION:  04/08/2011 DATE OF DISCHARGE:  LH                             HISTORY & PHYSICAL   CHIEF COMPLAINT:  High blood sugars at home and generalized weakness.  HISTORY OF PRESENT ILLNESS:  The patient is a 45 year old woman with a past medical history significant for type 2 diabetes mellitus, obesity, hypertension, hyperlipidemia, and depression.  She presents to the emergency department today with a chief complaint of generalized weakness.  She has been weak for more than 1 week.  She says that her blood sugars at home have been consistently above 400.  She has had an increase in thirst, increase in urination, dry mouth, and blurred vision.  She denies any recent weight loss.  She has had nausea andvomiting several times over the past 24 hours but no complaint of abdominal pain or pain with urination.  She denies fever and chills. She did have loose stools yesterday.  She has not had any stools today. She has not had any associated chest pain or shortness of breath.  She denies swelling in her legs but she has had some cramping in her legs. She says that she has been taking metformin 1000 mg twice daily without fail as prescribed.  She has also been compliant with Byetta twice daily.  She was referred to endocrinologist, Dr. Fransico Him several weeks ago by Dr. [name].  He took are off glipizide.  No other changes were made in her medication regimen regarding her diabetes according to her.  In the emergency department, the patient is noted to be afebrile and mildly tachycardic with a heart rate of 115 beats per minute.  Her blood pressure is on the lower side of normal at 94/76.  Her serum sodium is 124, serum potassium is 5.2, CO2 is 23, BUN is 64, creatinine  is 3.04, and glucose is 641.  Other additional laboratory data notable include a WBC of 11.2 and a hemoglobin of 17.6.  She is being admitted for further evaluation and management.  PAST MEDICAL HISTORY: 1. Type 2 diabetes mellitus. 2. Morbid obesity. 3. Hypertension. 4. Hyperlipidemia. 5. History of congestive heart failure secondary to hypertensive     urgency in 2001. 6. Depression with anxiety. 7. Insomnia. 8. Morbid obesity. 9. Rectal bleeding secondary to moderate internal hemorrhoids.  MEDICATIONS:  The patient knows of two medications and their doses but she is unsure of the other medications.  She is certain that she takes Byetta 5 mg injections twice daily.  She also takes metformin 1000 mg b.i.d.  The remainder of her medications are taken from the previous office visit with Dr. [name] in March 2012. 1. Furosemide 40 mg daily. 2. Alprazolam 0.5 mg 3 times daily as needed. 3. Flonase nasal spray 2 puffs in each nostril once daily. 4. Klor-Con 20 mEq b.i.d. 5. Aspirin 81 mg daily. 6. Omeprazole 20 mg 1 capsule daily. 7. Hydroxyzine HCl 50 mg 1 tablet at bedtime. 8. Singulair  10 mg daily. 9. Astelin nasal spray 2 sprays in each nostril twice daily. 10.Ibuprofen 800 mg daily as needed for pain. 11.Tramadol 50 mg b.i.d. as needed for pain. 12.Lisinopril 40 mg daily. 13.Maxzide 75/50 one tablet once daily. 14.Zyrtec 10 mg daily. 15.Paroxetine 20 mg daily. 16.Clonidine 0.3 mg at bedtime. 17.Lovastatin 40 mg 2 tablets at bedtime. 18.Cyclobenzaprine 10 mg at bedtime p.r.n. 19.Amlodipine 10 mg daily.  ALLERGIES:  The patient has no known drug allergies.  SOCIAL HISTORY:  The patient is widowed.  She has two children.  She receives disability benefits.  She smokes 1 pack of cigarettes per day and has been doing so for more than 5 years.  She denies alcohol and illicit drug use.  FAMILY HISTORY:  Her mother died of natural causes at 57 years of age. Her father is  alive, he is 8 years of age, he has a history of congestive heart failure and is status post pacemaker.  REVIEW OF SYSTEMS:  As above in the history present illness.  Otherwise, review of systems is negative.  Of note, the patient does mention that she had been drinking "light" beverages thinking that it had less sugar. She realizes that the light beverages have enough sugar to cause her blood sugars to increase significantly.  PHYSICAL EXAMINATION:  VITAL SIGNS:  Temperature 98.5, blood pressure 102/82, pulse 78, respiratory rate 25, oxygen saturation 98% on room air. GENERAL:  The patient is a pleasant obese African American woman who is currently sitting up in bed in no acute distress. HEENT:  Head is normocephalic and nontraumatic.  Pupils equal, round, and reactive to light.  Extraocular muscles are intact.  Conjunctivae are clear.  Sclerae are white.  Tympanic membranes not examined.  Nasal mucosa is dry.  No sinus tenderness.  Oropharynx reveals dry mucous membranes.  No posterior exudates or erythema. NECK:  Supple.  No adenopathy, no thyromegaly, no bruit, and no JVD. LUNGS:  Clear to auscultation bilaterally. HEART:  S1-S2 with a soft systolic murmur and borderline tachycardia. ABDOMEN:  Obese, positive bowel sounds, soft, nontender, and nondistended.  No hepatosplenomegaly, and no masses palpated. GU/RECTAL:  Deferred. EXTREMITIES:  Pedal pulses are palpable bilaterally.  No pretibial edema and no pedal edema. NEUROLOGIC:  The patient is alert and oriented x3.  Cranial nerves II- XII are intact.  Strength is 5/5 throughout.  Sensation is intact. PSYCHOLOGIC:  The patient is alert and oriented.  Her affect is pleasant.  She is cooperative.  She is well-dressed.  Her speech is clear.  ADMISSION LABORATORY DATA:  Sodium 124, potassium 5.2, chloride 82, CO2 23, glucose 641, BUN 64, and creatinine 3.04.  Total bilirubin 0.6, alkaline phosphatase 117, SGOT 13, SGPT 13, total  protein 8.1, albumin 3.9, and calcium 10.7.  WBC 11.2, hemoglobin 17.6, and platelet count 242.  Urinalysis essentially negative with exception of 500 glucose, small blood, and trace leukocytes.  ASSESSMENT: 1. Uncontrolled type 2 diabetes mellitus and probable diabetic     ketoacidosis.  The patient's CO2 is within normal limits at 23,     however, her anion gap is 19.  She has had uncontrolled     diabetes for some time now.  More specifically, her capillary blood     glucose at home has been ranging well over 400 by her account.     Today it is 641.  She was recently referred to endocrinologist, Dr.     Fransico Him by Dr. [name] for assistance with diabetes control.  She was     recently taken off of glipizide.  She remains on Byetta and     metformin. 2. Hyponatremia.  The patient's serum sodium is 124.  This is likely the     consequence of hyperglycemia and volume depletion. 3. Mild hyperkalemia.  The patient's serum potassium is 5.2, likely     secondary to DKA and uncontrolled diabetes. 4. Acute renal failure.  The patient's BUN is 64 and her creatinine is     3.04.  She has no prior history of renal failure.  It is likely     that her renal failure is secondary to prerenal azotemia as a     consequence of relative dehydration. 5. Secondary polycythemia.  The patient's hemoglobin is 17.6,     indicative of volume contraction/dehydration. 6. Mild leukocytosis.  This is likely the consequence of diabetic     ketoacidosis.  Her urinalysis reveals no WBCs.  Her chest x-ray is     essentially free of infection. 7. History of hypertension with low normal blood pressures.  The     patient is treated with multiple antihypertensive medications.  She     has a well-known history of malignant hypertension.  Her blood     pressure is significantly lower than baseline.  This is likely     secondary to dehydration and/or volume depletion.  PLAN: 1. The patient will be started on the insulin  drip, Glucommander     protocol for DKA. 2. She received 2 L of normal saline in the emergency department.  We     will continue aggressive IV fluid hydration.  Her IV fluids will be     changed according to the Glucommander and basic metabolic panel     which will be ordered every 4 hours. 3. Supportive treatment. 4. For further evaluation, we will check a TSH, free T4, hemoglobin     A1c, lipase, and amylase. 5. She will be treated with as-needed Phenergan or as-needed Zofran     for her recent history of nausea and     vomiting. 6. Tobacco cessation counseling.  We will add a nicotine patch daily. 7. We will consult endocrinologist, Dr. Fransico Him in the morning.     Elliot Cousin, M.D.     DF/MEDQ  D:  04/08/2011  T:  04/08/2011  Job:  [id number]  cc:   [name]. [name], M.D. Fax: [phone]  Purcell Nails, MD Fax: [phone]  Electronically Signed by Elliot Cousin M.D. on 04/12/2011 06:35:47 PM

## 2011-04-15 ENCOUNTER — Encounter: Payer: Self-pay | Admitting: Family Medicine

## 2011-04-20 ENCOUNTER — Encounter: Payer: Self-pay | Admitting: Family Medicine

## 2011-04-20 NOTE — Consult Note (Signed)
[name], NOLA NO.:  [id number]  MEDICAL RECORD NO.:  [id number]  LOCATION:  A206                          FACILITY:  [facility]  PHYSICIAN:  Purcell Nails, MD DATE OF BIRTH:  1966/07/16  DATE OF CONSULTATION: DATE OF DISCHARGE:                                CONSULTATION   REASON FOR CONSULTATION:  Uncontrolled type 2 diabetes.  HISTORY OF PRESENT ILLNESS:  This is a 45 year old African American woman with past medical history significant for type 2 diabetes, obesity, hypertension, hyperlipidemia, and depression.  She is familiar to me from outpatient visit a week ago, at which time she had hemoglobin A1c of 12% when I approached for intensive multimodal therapy.  However, she was reluctant to initiate insulin therapy.  She accepted Byetta 5 mcg b.i.d. and metformin 1000 mg t.i.d.  She was instructed to return with fingerstick blood glucose reading 4 times a day for reevaluation, however, she failed to go to the clinic.  She checked into the ER yesterday with a complaint of polyuria, polydipsia, and was found to have hyperglycemia above 400 with no ketosis, however, her chemistry showed hyponatremia and hyperkalemia with slightly elevated anion gap of 19.  She was subsequently admitted for hydration and IV insulin therapy and consult for Endocrinology was requested.  Currently, she is stable in ICU where she remains on insulin drip.  PAST MEDICAL HISTORY:  As above plus history of congestive heart failure, insomnia, rectal bleeding secondary to moderate internal hemorrhoids.  HOME MEDICATIONS: 1. Byetta 5 mcg daily. 2. Metformin 1 g b.i.d. 3. Furosemide 40 mg daily. 4. Alprazolam 0.5 mg 3 times a day. 5. Flonase. 6. Klor-Con. 7. Aspirin. 8. Omeprazole. 9.  Hydroxyzine. 10.Singulair. 11.Astelin. 12.Ibuprofen. 13.Tramadol. 14.Lisinopril. 15.Maxzide. 16.Zyrtec. 17.Paroxetine. 18.Clonidine. 19.Lovastatin. 20.Cyclobenzaprine. 21.Amlodipine.  She has no known allergies.  SOCIAL HISTORY:  She is widowed.  She has 2 children, 1 grown, smokes a pack of cigarettes a day.  Denies alcohol or drug use.  FAMILY HISTORY:  Significant for her mother died at the age of 65, of unknown cause.  Her father at age 39 had congestive heart failure, however, her siblings have diabetes.  REVIEW OF SYSTEMS:  At this point, the patient denies any chest pain, shortness of breath.  No bleeding.  No headache.  The rest of the systems have been reviewed and negative.  PHYSICAL EXAMINATION:  GENERAL:  She is alert and oriented x3, adequately hydrated.  She has a stable state of mind, not in acute distress or anxiety. VITAL SIGNS:  Her vital signs stable this morning.  Blood pressure at 106/82, pulse rate 78, temperature afebrile. HEENT:  Moist mucous membranes.  No JVD, no thyromegaly. CHEST:  Clear to auscultation bilaterally. CARDIOVASCULAR:  Normal S1 and S2.  No murmur, no gallops. ABDOMEN:  Soft and nontender, however, obese. EXTREMITIES:  No edema. CNS:  Nonfocal. SKIN:  No rash.  No hyperemia.  Her most recent lab work show sodium 135 improved from 124, potassium 3.7 improved from 5.2, chloride 99, bicarb 25, glucose 119, BUN 36, creatinine 1.24.  Hemoglobin A1c is 12.6%.  ASSESSMENT AND PLAN:  Type 2 diabetes, chronically uncontrolled, most recent A1c 12.6%.  The  patient presents with severe hyperglycemia with impending hyperosmolar state, appropriately treated with IV insulin therapy, currently well hydrated and glycemic control achieved to near normal.  I would switch over to basal and bolus insulin, using Lantus 60 units nightly and NovoLog 15 units t.i.d. a.c. plus moderate dose sliding scale.  She will need NPH 20 units a 1 time dose to  bridge between the IV drip to basal insulin at night.  She has acute on chronic kidney failure.  At this point, we will discontinue metformin and Byetta is not on the formulary, hence, will hold for now, she is counseled for 20 minutes regarding the need to control diabetes with intensive insulin therapy and she agrees to do so.  I will reschedule her clinic outpatient visit to 1 week.  I will continue to see her in house with you and appreciate the opportunity to participate in her care.  I will reevaluate to adjust her insulin based on fingerstick readings.  Dear Dr. Sherrie Mustache, thank you for the opportunity to participate in the care of this pleasant patient.         ______________________________ Purcell Nails, MD    GN/MEDQ  D:  04/09/2011  T:  04/10/2011  Job:  [id number]  Electronically Signed by Purcell Nails MD on 04/20/2011 10:24:16 AM

## 2011-04-21 ENCOUNTER — Encounter: Payer: Self-pay | Admitting: Family Medicine

## 2011-04-21 ENCOUNTER — Ambulatory Visit (INDEPENDENT_AMBULATORY_CARE_PROVIDER_SITE_OTHER): Payer: Medicare Other | Admitting: Family Medicine

## 2011-04-21 ENCOUNTER — Telehealth: Payer: Self-pay

## 2011-04-21 VITALS — BP 140/100 | HR 105 | Resp 16 | Ht 61.0 in | Wt 230.8 lb

## 2011-04-21 DIAGNOSIS — E785 Hyperlipidemia, unspecified: Secondary | ICD-10-CM

## 2011-04-21 DIAGNOSIS — I10 Essential (primary) hypertension: Secondary | ICD-10-CM

## 2011-04-21 DIAGNOSIS — E119 Type 2 diabetes mellitus without complications: Secondary | ICD-10-CM

## 2011-04-21 DIAGNOSIS — F329 Major depressive disorder, single episode, unspecified: Secondary | ICD-10-CM

## 2011-04-21 LAB — GLUCOSE, POCT (MANUAL RESULT ENTRY): POC Glucose: 174

## 2011-04-21 MED ORDER — TRIAMTERENE-HCTZ 37.5-25 MG PO TABS: 1.0000 | ORAL_TABLET | Freq: Every day | ORAL | Status: DC

## 2011-04-21 MED ORDER — CHOLINE FENOFIBRATE 135 MG PO CPDR: 135.0000 mg | DELAYED_RELEASE_CAPSULE | Freq: Every day | ORAL | Status: DC

## 2011-04-21 NOTE — Patient Instructions (Signed)
F/u in  7 weeks.  New additional medication for your blood pressure  Fasting lipid, cmp and egfr and lipids in 7 weeks  It is important that you exercise regularly at least 30 minutes 5 times a week. If you develop chest pain, have severe difficulty breathing, or feel very tired, stop exercising immediately and seek medical attention    A healthy diet is rich in fruit, vegetables and whole grains. Poultry fish, nuts and beans are a healthy choice for protein rather then red meat. A low sodium diet and drinking 64 ounces of water daily is generally recommended. Oils and sweet should be limited. Carbohydrates especially for those who are diabetic or overweight, should be limited to 34-45 gram per meal. It is important to eat on a regular schedule, at least 3 times daily. Snacks should be primarily fruits, vegetables or nuts.

## 2011-04-21 NOTE — Telephone Encounter (Signed)
pls change to crestor 10mg  one at night #30- refill4, and let pt and pharmacy know

## 2011-04-22 MED ORDER — ROSUVASTATIN CALCIUM 10 MG PO TABS: 10.0000 mg | ORAL_TABLET | Freq: Every day | ORAL | Status: DC

## 2011-04-22 NOTE — Telephone Encounter (Signed)
Changed to crestor and they know to d/c trilipix

## 2011-04-28 ENCOUNTER — Ambulatory Visit: Payer: Medicare Other | Admitting: Family Medicine

## 2011-05-01 NOTE — Progress Notes (Signed)
  Subjective:    Patient ID: [name], female    DOB: 04/29/1966, 45 y.o.   MRN: [id number]  HPI Pt in for f/u of recent hospitalization when she was in DKA. She now has marked improvement in her blood sugars, is following a diabetic diet, and is being followed by endocrinology. She states she feels much better than she has in weeks, and was unaware of just how sick she actually was.     Review of Systems Denies recent fever or chills. Denies sinus pressure, nasal congestion, ear pain or sore throat. Denies chest congestion, productive cough or wheezing. Denies chest pains, palpitations, paroxysmal nocturnal dyspnea, orthopnea and leg swelling Denies abdominal pain, nausea, vomiting,diarrhea or constipation.  Denies rectal bleeding or change in bowel movement. Denies headaches, seizure, numbness, or tingling. Denies uncontrolled  depression, anxiety or insomnia. Denies skin break down or rash. Chronic back pain unchanged        Objective:   Physical Exam Patient alert and oriented and in no Cardiopulmonary distress.  HEENT: No facial asymmetry, EOMI, no sinus tenderness, TM's clear, Oropharynx pink and moist.  Neck supple no adenopathy.  Chest: Clear to auscultation bilaterally.  CVS: S1, S2 no murmurs, no S3.  ABD: Soft non tender. Bowel sounds normal.  Ext: No edema  MS: decreased ROM spine, shoulders, hips and knees.  Skin: Intact, no ulcerations or rash noted.  Psych: Good eye contact, normal affect. Memory intact not anxious or depressed appearing.  CNS: CN 2-12 intact, power, tone an gross sensation normal throughout.        Assessment & Plan:

## 2011-05-01 NOTE — Assessment & Plan Note (Signed)
Uncontrolled, recently in ICU in dKA, improved and now being followed by endocrine

## 2011-05-01 NOTE — Assessment & Plan Note (Signed)
Controlled, no change in medication  

## 2011-05-01 NOTE — Assessment & Plan Note (Signed)
Medication compliance addressed. Commitment to regular exercise and healthy  food choices, with portion control discussed. DASH diet and low fat diet discussed and literature offered. Changes in medication made at this visit.  

## 2011-05-01 NOTE — Assessment & Plan Note (Signed)
Uncontrolled, low fat diet encouraged, rept labs due in August

## 2011-06-04 IMAGING — CR DG RIBS W/ CHEST 3+V*L*
4 series · 4 of 4 positions shown · non-contrast
Comparison: Chest radiograph 04/17/2008

CLINICAL DATA: Left lower posterior rib pain status post fall

LEFT RIBS AND CHEST - 3+ VIEW

[view not recorded (1 of 4)]
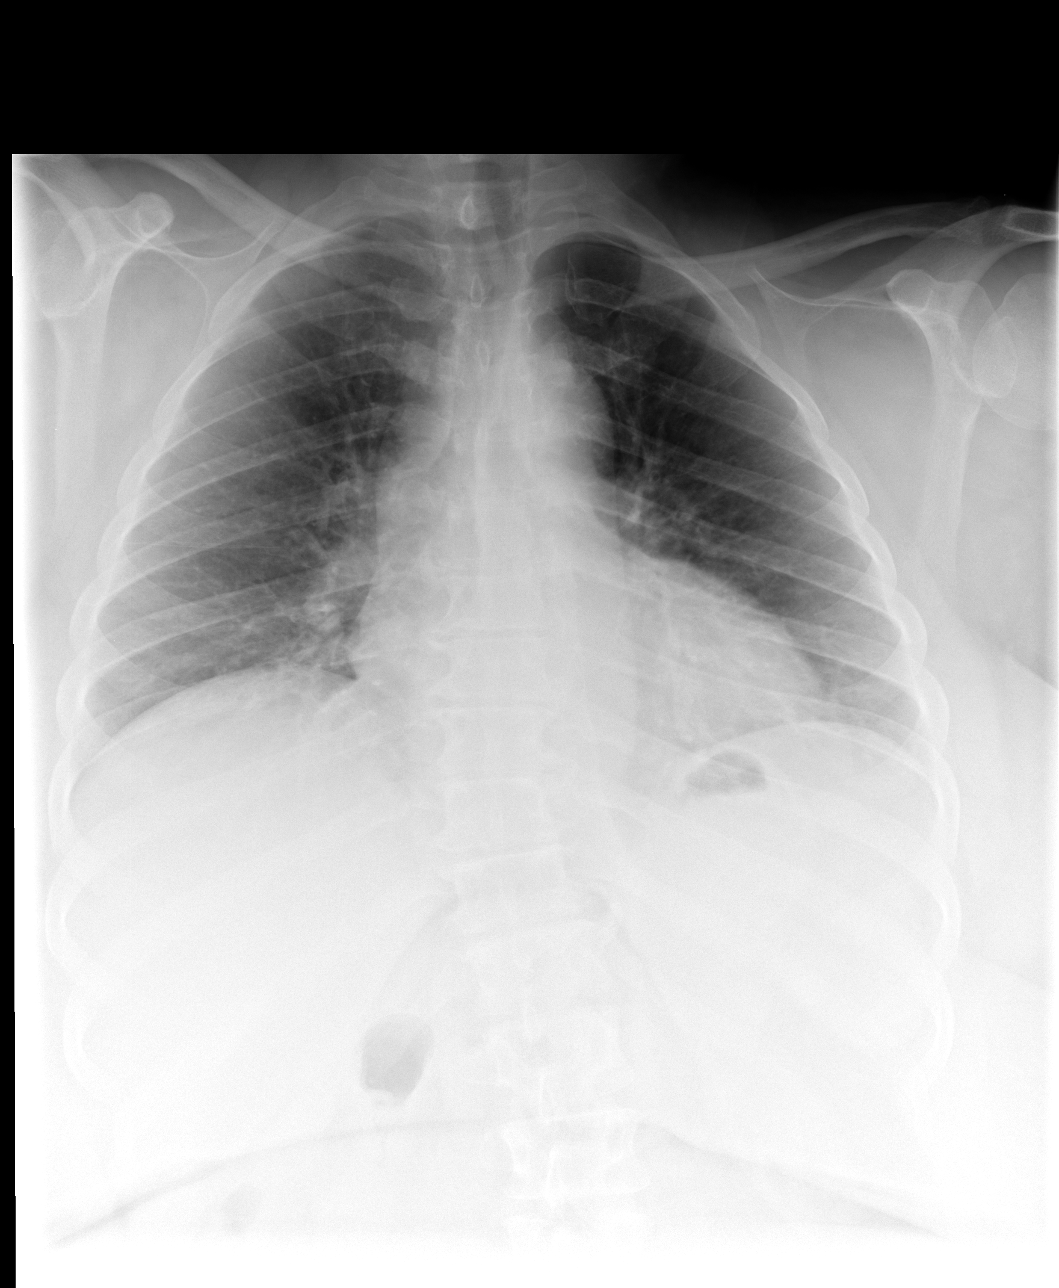

[view not recorded (2 of 4)]
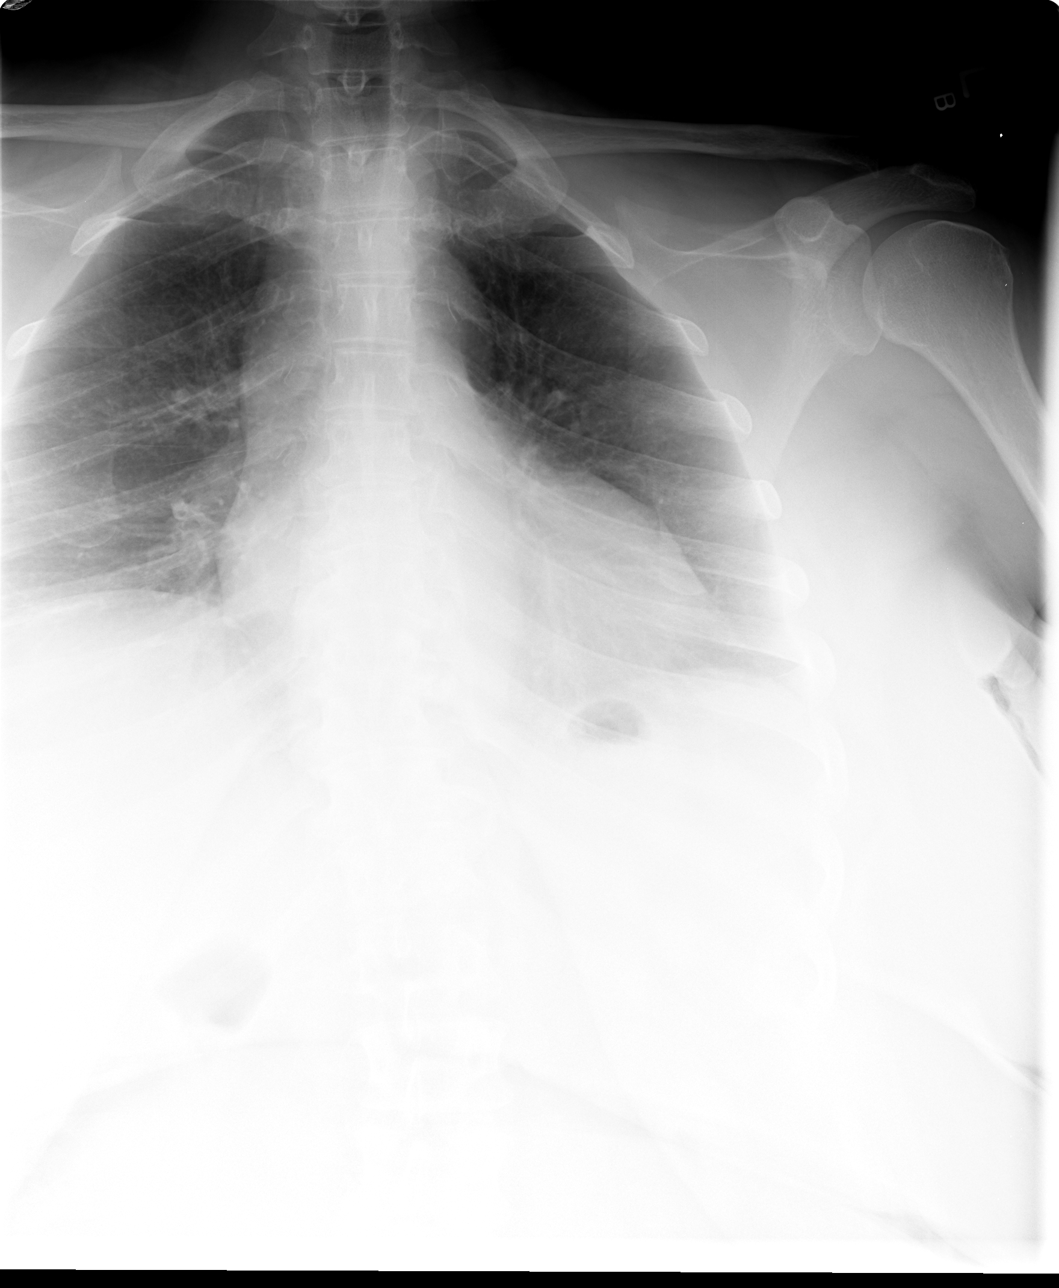

[view not recorded (3 of 4)]
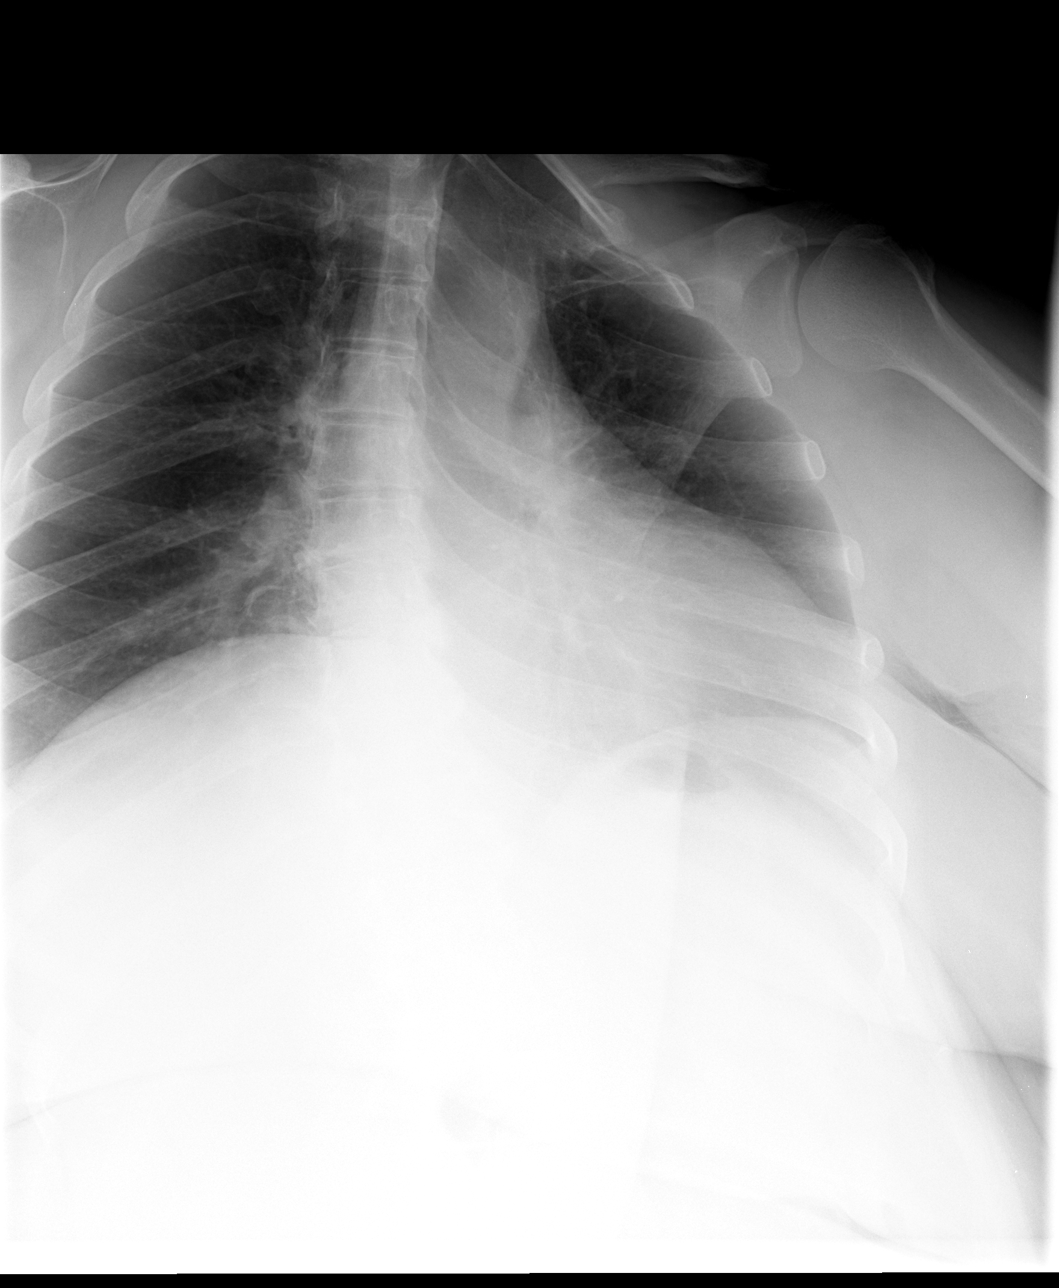

[view not recorded (4 of 4)]
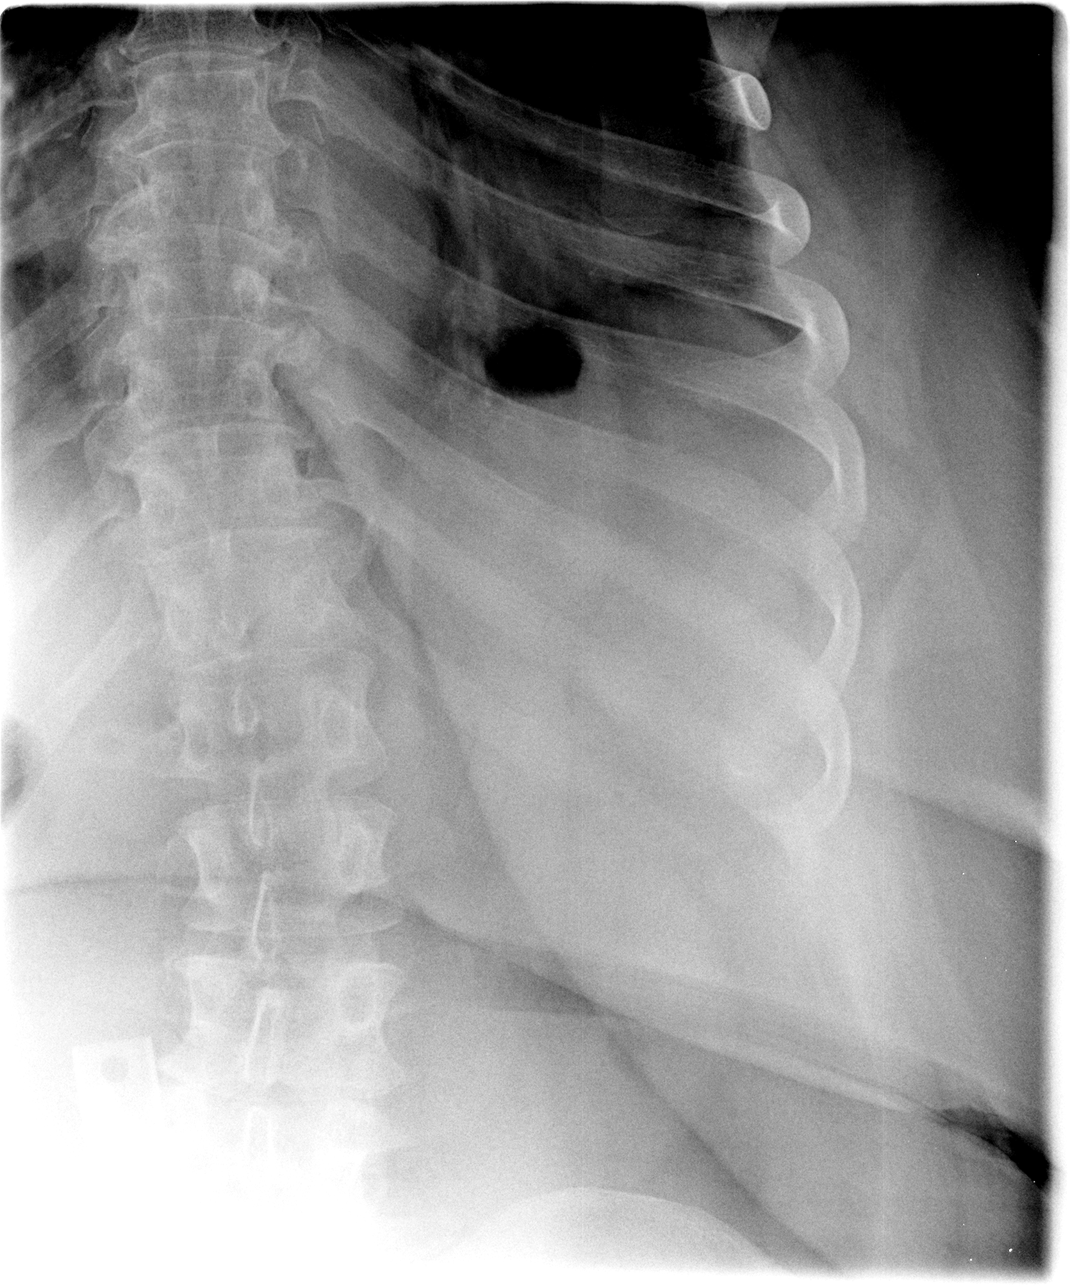

[4 of 4 positions shown; findings below may reference images not displayed]

FINDINGS: Cardiac enlargement.
Normal mediastinal contours and pulmonary vascularity.
Atelectasis right lung base.
Remaining lungs clear.
No definite pleural effusion or pneumothorax.
No definite rib fracture or bone destruction.
IMPRESSION: No definite acute bony abnormalities.
Mild enlargement of the cardiac silhouette.

## 2011-06-04 IMAGING — CR DG FOOT COMPLETE 3+V*R*
3 series · 3 of 3 positions shown · non-contrast
Comparison: None

CLINICAL DATA: Trauma

RIGHT FOOT COMPLETE - 3+ VIEW

[view not recorded (1 of 3)]
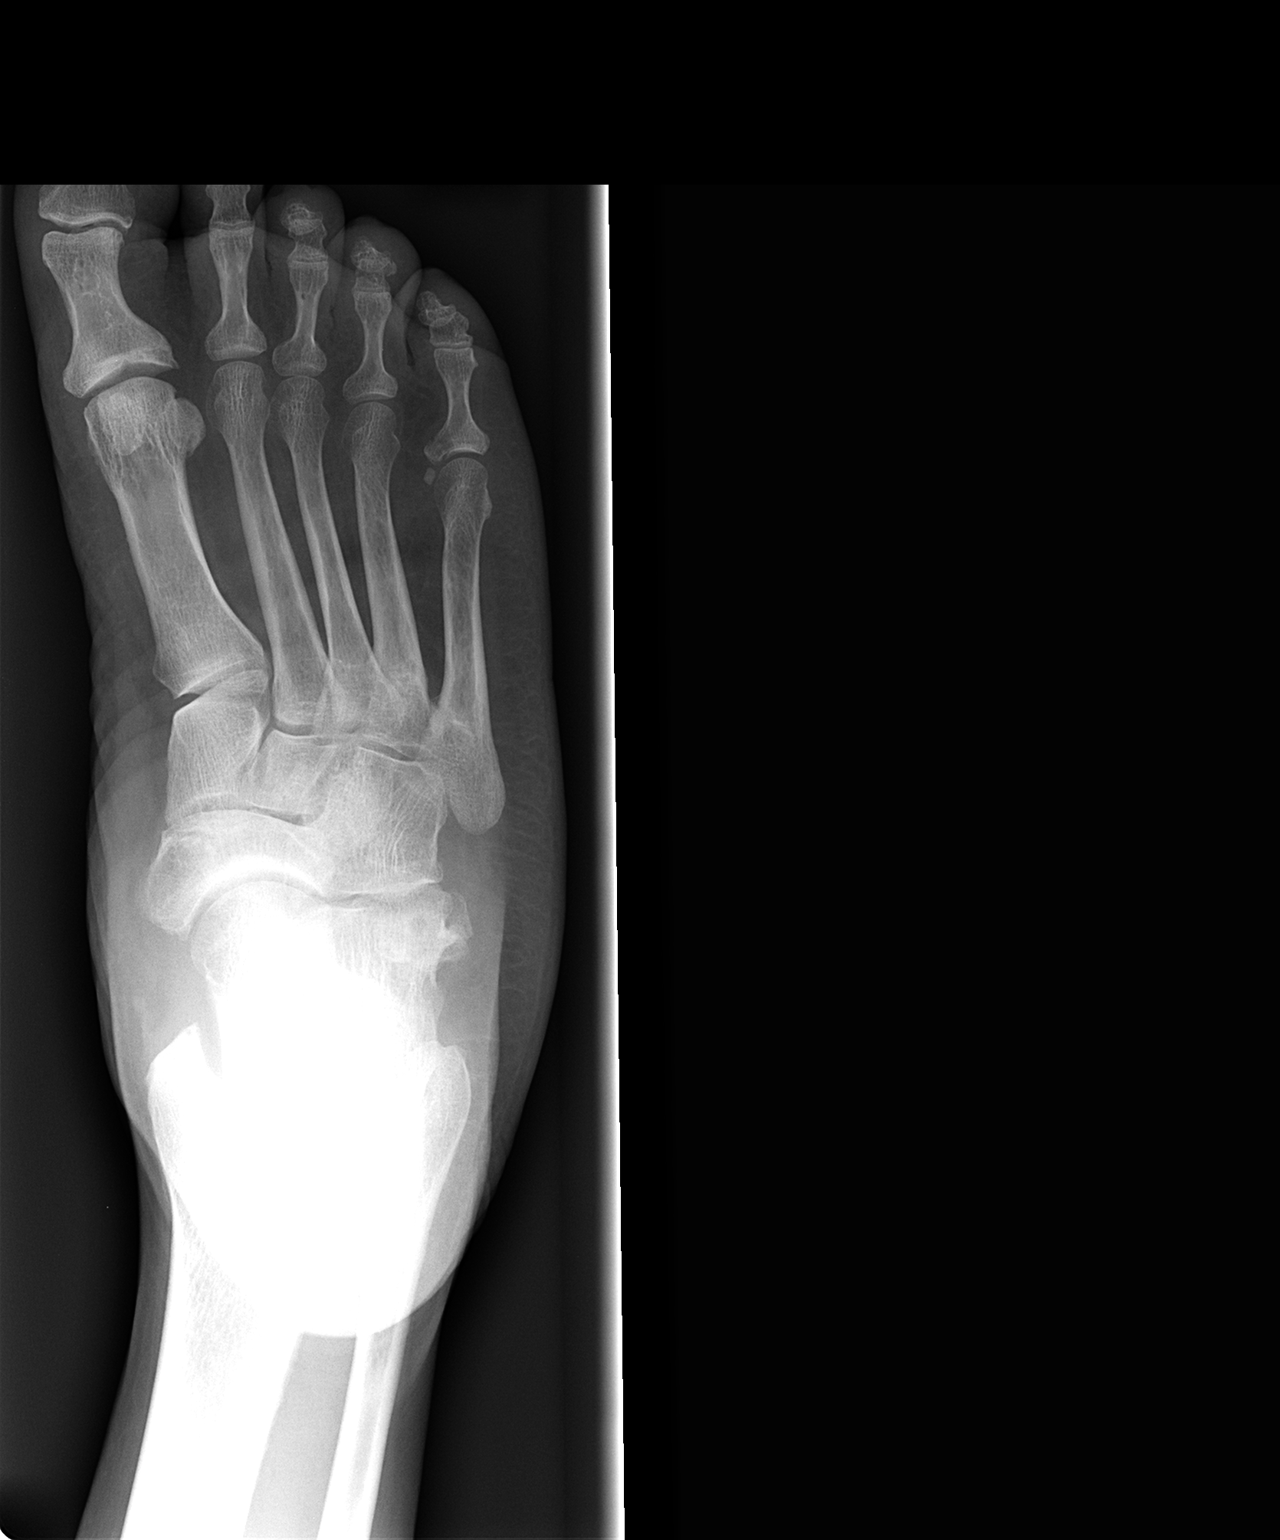

[view not recorded (2 of 3)]
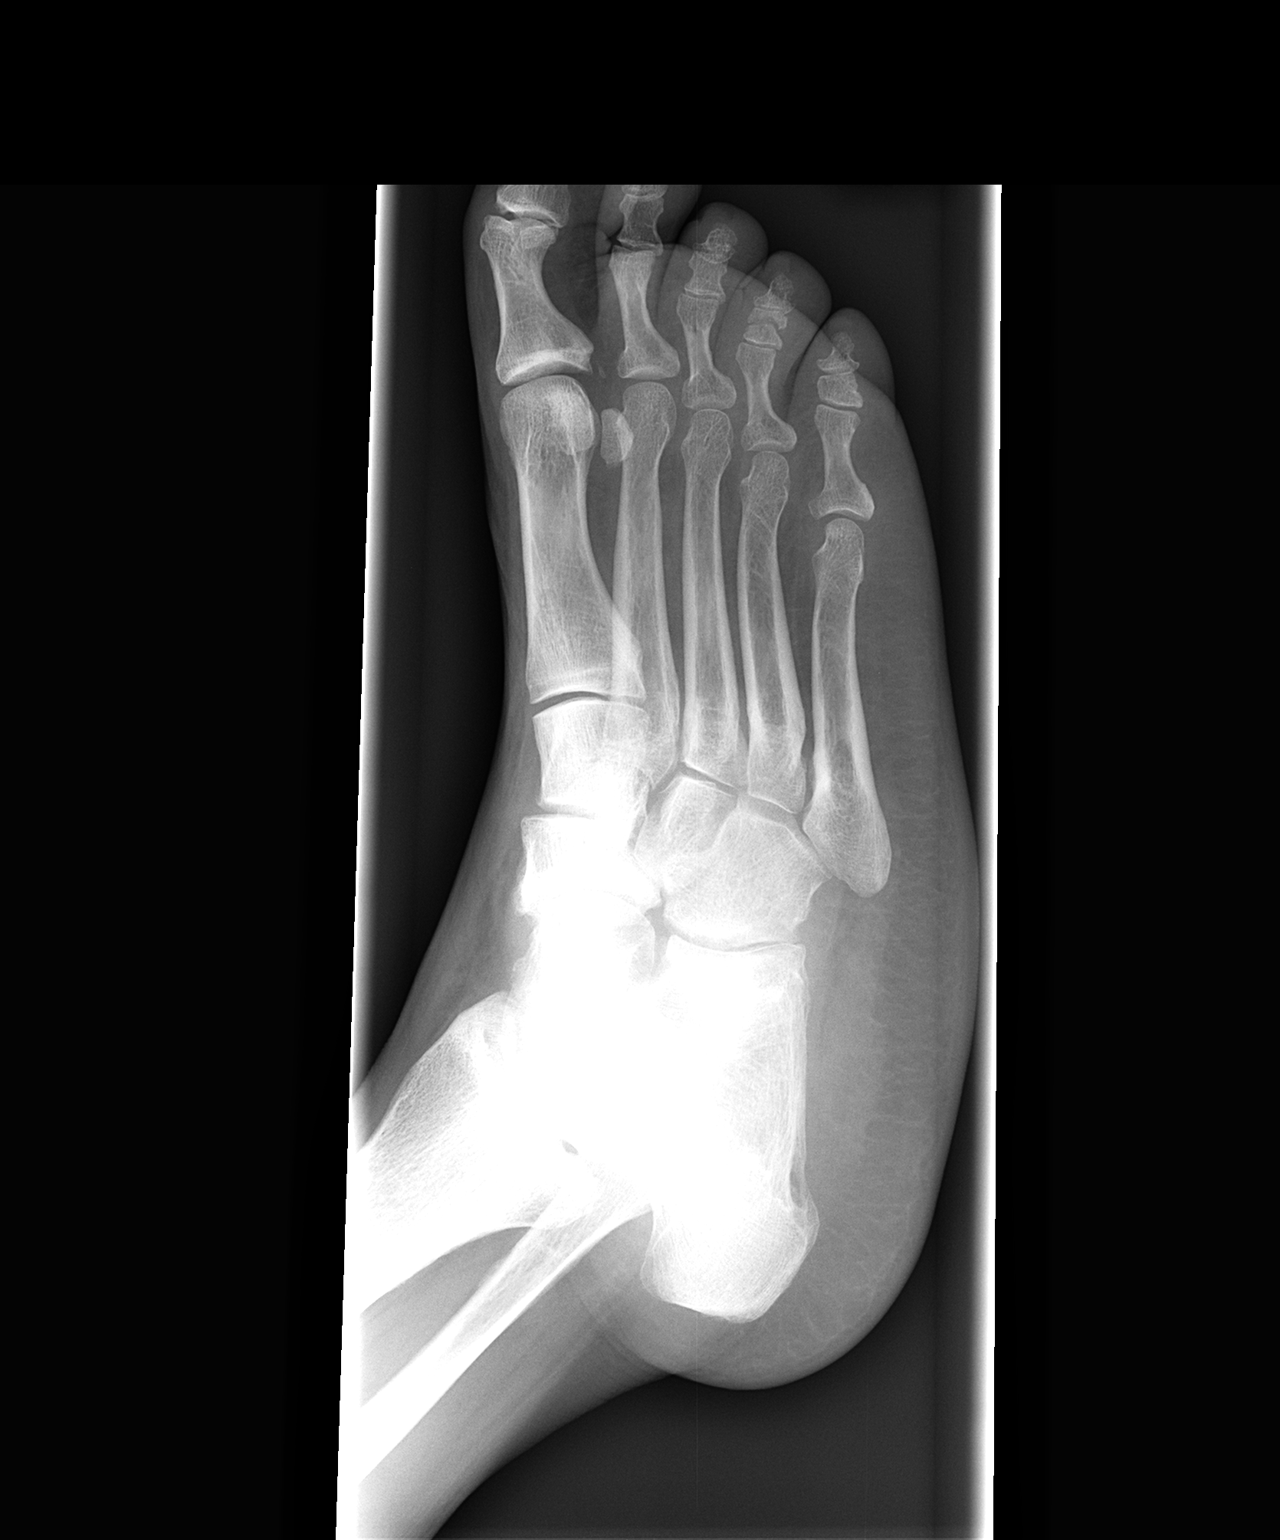

[view not recorded (3 of 3)]
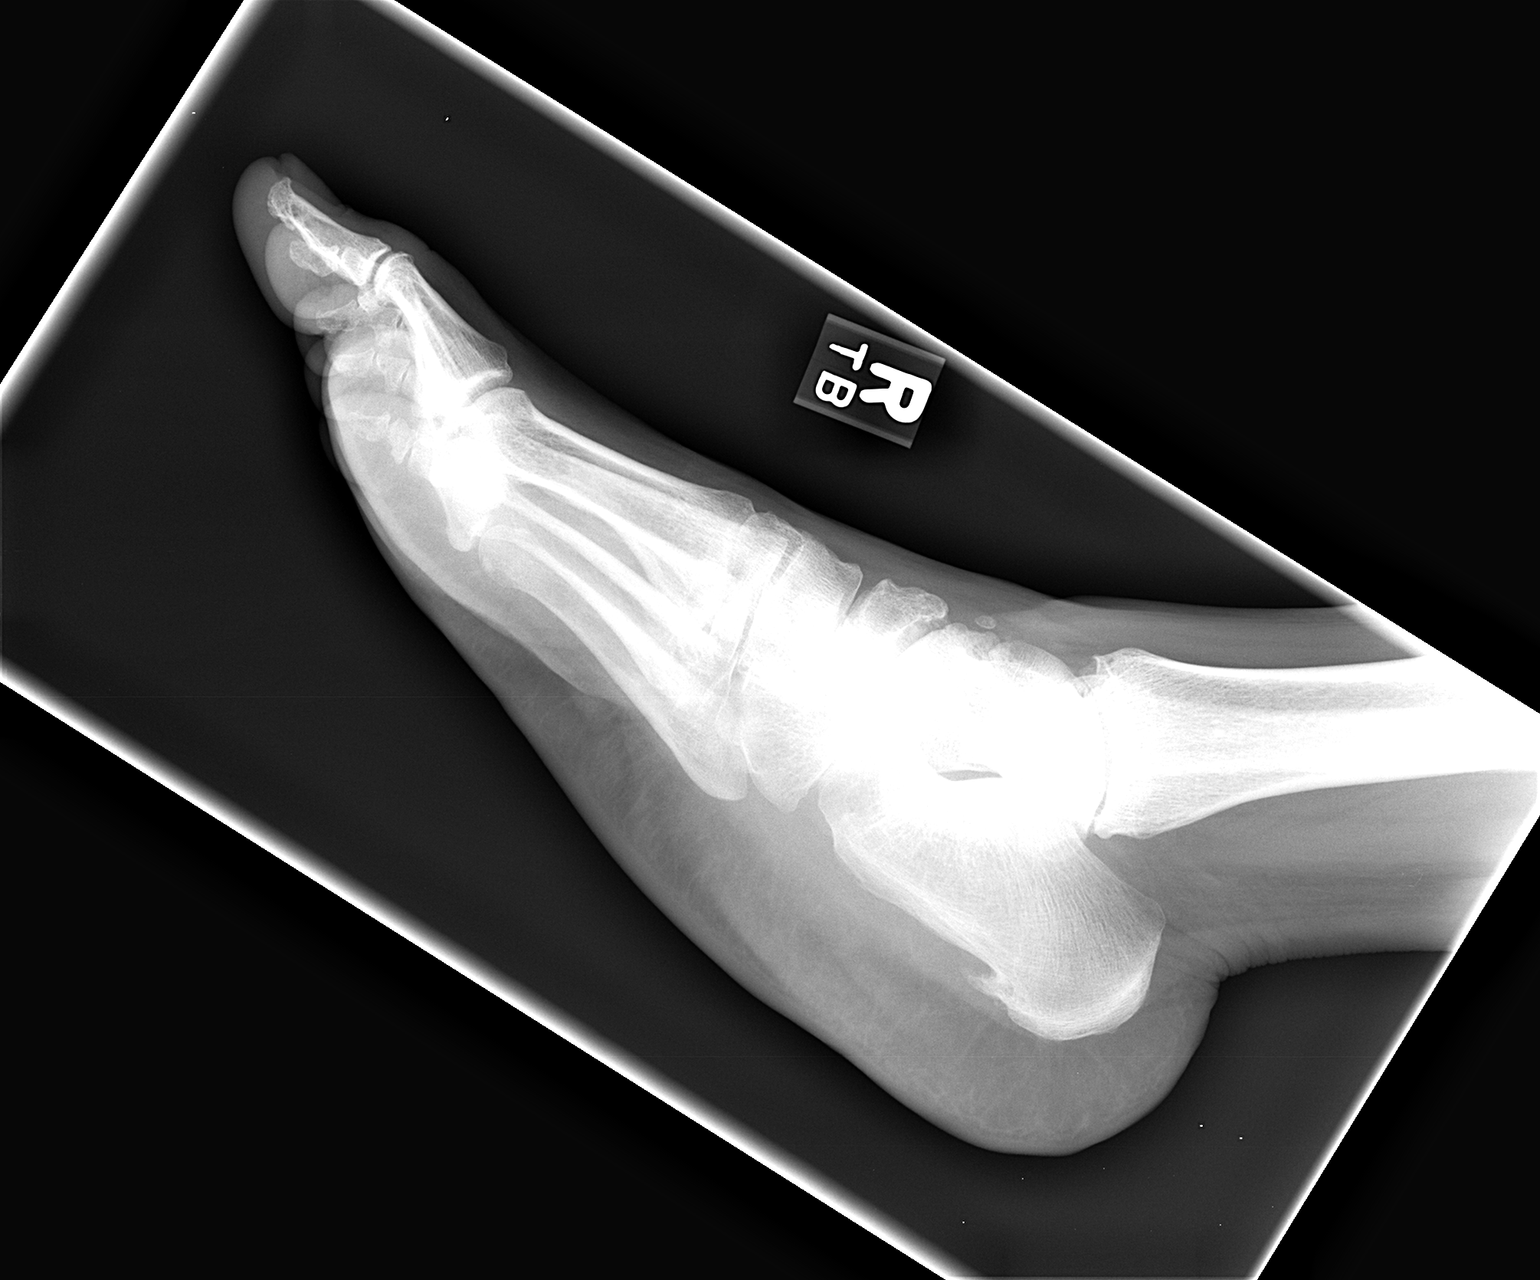

[3 of 3 positions shown; findings below may reference images not displayed]

FINDINGS: Three views of the right foot submitted.  There is
nondisplaced fracture of the middle phalanx right fourth toe.
IMPRESSION: Nondisplaced fracture of the middle phalanx right fourth toe.

## 2011-06-05 ENCOUNTER — Encounter: Payer: Self-pay | Admitting: Family Medicine

## 2011-06-08 ENCOUNTER — Encounter: Payer: Self-pay | Admitting: Family Medicine

## 2011-06-09 ENCOUNTER — Encounter: Payer: Self-pay | Admitting: Family Medicine

## 2011-06-09 ENCOUNTER — Ambulatory Visit (INDEPENDENT_AMBULATORY_CARE_PROVIDER_SITE_OTHER): Payer: Medicare Other | Admitting: Family Medicine

## 2011-06-09 VITALS — BP 200/130 | HR 98 | Ht 61.0 in | Wt 226.1 lb

## 2011-06-09 DIAGNOSIS — E669 Obesity, unspecified: Secondary | ICD-10-CM

## 2011-06-09 DIAGNOSIS — F3289 Other specified depressive episodes: Secondary | ICD-10-CM

## 2011-06-09 DIAGNOSIS — E785 Hyperlipidemia, unspecified: Secondary | ICD-10-CM

## 2011-06-09 DIAGNOSIS — E119 Type 2 diabetes mellitus without complications: Secondary | ICD-10-CM

## 2011-06-09 DIAGNOSIS — F329 Major depressive disorder, single episode, unspecified: Secondary | ICD-10-CM

## 2011-06-09 DIAGNOSIS — I10 Essential (primary) hypertension: Secondary | ICD-10-CM

## 2011-06-09 DIAGNOSIS — F17200 Nicotine dependence, unspecified, uncomplicated: Secondary | ICD-10-CM

## 2011-06-09 NOTE — Progress Notes (Signed)
  Subjective:    Patient ID: [name], female    DOB: 09-09-1966, 45 y.o.   MRN: [id number]  HPI Pt states she has been out of her bP meds x 5 days, states she expects to getthem in the next 2 days, "[place] them [place] the beach" Reports marked improvement in her blood sugars, she is testing regularly and reports fasting sugars to be seldom over 150. Now being treated by endocrine.    Review of Systems Denies recent fever or chills. Denies sinus pressure, nasal congestion, ear pain or sore throat. Denies chest congestion, productive cough or wheezing. Denies chest pains, palpitations and leg swelling Denies abdominal pain, nausea, vomiting,diarrhea or constipation.   Denies dysuria, frequency, hesitancy or incontinence. Chronic back pain and spasm Denies headaches, seizures, numbness, or tingling. Denies uncontrolled epression, anxiety or insomnia. Denies skin break down or rash.        Objective:   Physical Exam Patient alert and oriented and in no cardiopulmonary distress.  HEENT: No facial asymmetry, EOMI, no sinus tenderness,  oropharynx pink and moist.  Neck supple no adenopathy.  Chest: Clear to auscultation bilaterally.  CVS: S1, S2 no murmurs, no S3.  ABD: Soft non tender. Bowel sounds normal.  Ext: No edema  MS: decreased ROM spine, shoulders, hips and knees.  Skin: Intact, no ulcerations or rash noted.  Psych: Good eye contact, normal affect. Memory intact not anxious or depressed appearing.  CNS: CN 2-12 intact, power, tone and sensation normal throughout.         Assessment & Plan:

## 2011-06-09 NOTE — Patient Instructions (Addendum)
F/u in 4 weeks.   It is vITAL that you take your blood pressure med EVERY day to   Reduce risk of stroke and heart failure and kidney failure  Your blood pressure today is extremely high, this is because you report taking no medication sincn 6 days ago when you left it at the beach.  GOdirectly to the ED ifyou develop headache, blurred vision , weakness or numbness.  ENSURE you start taking the clonidine and maxzide today as prescribed and resume the cozaar also as soon as you get the medication.  Fasting lipid, hepatic and chem 7 before next visit

## 2011-06-18 NOTE — Assessment & Plan Note (Signed)
Improved. Pt applauded on succesful weight loss through lifestyle change, and encouraged to continue same. Weight loss goal set for the next several months.  

## 2011-06-18 NOTE — Assessment & Plan Note (Signed)
improved

## 2011-06-18 NOTE — Assessment & Plan Note (Signed)
Unchanged , cessation counseling done 

## 2011-06-18 NOTE — Assessment & Plan Note (Signed)
Improved, being treated by endocrine

## 2011-06-18 NOTE — Assessment & Plan Note (Signed)
Uncontrolled , pout of medication x 1 week

## 2011-06-30 ENCOUNTER — Telehealth: Payer: Self-pay | Admitting: Family Medicine

## 2011-06-30 NOTE — Telephone Encounter (Signed)
pls verify that we have been filling this med before refilling

## 2011-07-01 ENCOUNTER — Other Ambulatory Visit: Payer: Self-pay | Admitting: Family Medicine

## 2011-07-01 MED ORDER — ALPRAZOLAM 0.5 MG PO TABS: ORAL_TABLET | ORAL | Status: DC

## 2011-07-01 NOTE — Telephone Encounter (Signed)
MED SENT AS REQUESTED  

## 2011-07-06 ENCOUNTER — Telehealth: Payer: Self-pay | Admitting: Family Medicine

## 2011-07-06 ENCOUNTER — Encounter: Payer: Self-pay | Admitting: Family Medicine

## 2011-07-07 ENCOUNTER — Encounter: Payer: Self-pay | Admitting: Family Medicine

## 2011-07-07 ENCOUNTER — Ambulatory Visit: Payer: Medicare Other | Admitting: Family Medicine

## 2011-07-08 NOTE — Telephone Encounter (Signed)
error 

## 2011-07-15 ENCOUNTER — Telehealth: Payer: Self-pay | Admitting: Family Medicine

## 2011-07-15 NOTE — Telephone Encounter (Signed)
toradol 60mg  IM only, blood sugar not controlled, can get the flu vaccine at the same time if she wishes

## 2011-07-15 NOTE — Telephone Encounter (Signed)
Aching all over, her hands, legs, knees, shoulder, pretty much everywhere. Wants to come in and get pain shots. Don't see where she had any recently

## 2011-07-16 NOTE — Telephone Encounter (Signed)
Returned call, left message. 

## 2011-07-21 NOTE — Telephone Encounter (Signed)
Patient has not contacted office since leaving message

## 2011-07-23 LAB — BASIC METABOLIC PANEL
BUN: 13
CO2: 25
Calcium: 8.9
Chloride: 109
Creatinine, Ser: 1.03
GFR calc Af Amer: >60
GFR calc non Af Amer: 58 — ABNORMAL LOW
Glucose, Bld: 125 — ABNORMAL HIGH
Potassium: 4.2
Sodium: 138

## 2011-07-23 LAB — CBC
HCT: 32.9 — ABNORMAL LOW
Hemoglobin: 11.4 — ABNORMAL LOW
MCHC: 34.6
MCV: 89.2
Platelets: 218
RBC: 3.68 — ABNORMAL LOW
RDW: 13.1
WBC: 5.4

## 2011-07-23 LAB — DIFFERENTIAL
Basophils Absolute: 0
Basophils Relative: 1
Eosinophils Absolute: 0.3
Eosinophils Relative: 5
Lymphocytes Relative: 31
Lymphs Abs: 1.7
Monocytes Absolute: 0.3
Monocytes Relative: 6
Neutro Abs: 3.1
Neutrophils Relative %: 57

## 2011-07-23 LAB — POCT CARDIAC MARKERS
CKMB, poc: 1.3
Myoglobin, poc: 67
Operator id: 221061
Troponin i, poc: <0.05

## 2011-07-23 LAB — B-NATRIURETIC PEPTIDE (CONVERTED LAB): Pro B Natriuretic peptide (BNP): <30

## 2011-08-27 ENCOUNTER — Other Ambulatory Visit: Payer: Self-pay | Admitting: Family Medicine

## 2011-09-23 ENCOUNTER — Other Ambulatory Visit: Payer: Self-pay | Admitting: Family Medicine

## 2011-09-24 ENCOUNTER — Other Ambulatory Visit: Payer: Self-pay | Admitting: Family Medicine

## 2011-10-27 DIAGNOSIS — S8263XA Displaced fracture of lateral malleolus of unspecified fibula, initial encounter for closed fracture: Secondary | ICD-10-CM

## 2011-10-27 DIAGNOSIS — N6320 Unspecified lump in the left breast, unspecified quadrant: Secondary | ICD-10-CM

## 2011-10-27 HISTORY — DX: Unspecified lump in the left breast, unspecified quadrant: N63.20

## 2011-10-27 HISTORY — DX: Displaced fracture of lateral malleolus of unspecified fibula, initial encounter for closed fracture: S82.63XA

## 2011-11-25 ENCOUNTER — Other Ambulatory Visit: Payer: Self-pay | Admitting: Family Medicine

## 2011-11-27 ENCOUNTER — Other Ambulatory Visit: Payer: Self-pay | Admitting: Family Medicine

## 2011-12-09 ENCOUNTER — Emergency Department (HOSPITAL_COMMUNITY)
Admission: EM | Admit: 2011-12-09 | Discharge: 2011-12-09 | Disposition: A | Discharge status: Home / Self Care | Payer: PRIVATE HEALTH INSURANCE | Attending: Emergency Medicine | Admitting: Emergency Medicine

## 2011-12-09 ENCOUNTER — Emergency Department (HOSPITAL_COMMUNITY): Payer: PRIVATE HEALTH INSURANCE

## 2011-12-09 ENCOUNTER — Encounter (HOSPITAL_COMMUNITY): Payer: Self-pay | Admitting: Emergency Medicine

## 2011-12-09 DIAGNOSIS — Z6835 Body mass index (BMI) 35.0-35.9, adult: Secondary | ICD-10-CM | POA: Insufficient documentation

## 2011-12-09 DIAGNOSIS — R079 Chest pain, unspecified: Secondary | ICD-10-CM | POA: Insufficient documentation

## 2011-12-09 DIAGNOSIS — S82899A Other fracture of unspecified lower leg, initial encounter for closed fracture: Secondary | ICD-10-CM | POA: Insufficient documentation

## 2011-12-09 DIAGNOSIS — F1021 Alcohol dependence, in remission: Secondary | ICD-10-CM | POA: Insufficient documentation

## 2011-12-09 DIAGNOSIS — Z7982 Long term (current) use of aspirin: Secondary | ICD-10-CM | POA: Insufficient documentation

## 2011-12-09 DIAGNOSIS — N63 Unspecified lump in unspecified breast: Secondary | ICD-10-CM | POA: Insufficient documentation

## 2011-12-09 DIAGNOSIS — E119 Type 2 diabetes mellitus without complications: Secondary | ICD-10-CM | POA: Insufficient documentation

## 2011-12-09 DIAGNOSIS — Z79899 Other long term (current) drug therapy: Secondary | ICD-10-CM | POA: Insufficient documentation

## 2011-12-09 DIAGNOSIS — M25569 Pain in unspecified knee: Secondary | ICD-10-CM | POA: Insufficient documentation

## 2011-12-09 DIAGNOSIS — I10 Essential (primary) hypertension: Secondary | ICD-10-CM | POA: Insufficient documentation

## 2011-12-09 DIAGNOSIS — E785 Hyperlipidemia, unspecified: Secondary | ICD-10-CM | POA: Insufficient documentation

## 2011-12-09 DIAGNOSIS — X58XXXA Exposure to other specified factors, initial encounter: Secondary | ICD-10-CM | POA: Insufficient documentation

## 2011-12-09 DIAGNOSIS — E669 Obesity, unspecified: Secondary | ICD-10-CM | POA: Insufficient documentation

## 2011-12-09 MED ORDER — OXYCODONE-ACETAMINOPHEN 5-325 MG PO TABS
1.0000 | ORAL_TABLET | Freq: Once | ORAL | Status: AC
  Administered 2011-12-09: 1 via ORAL
  Filled 2011-12-09: qty 1

## 2011-12-09 MED ORDER — OXYCODONE-ACETAMINOPHEN 5-325 MG PO TABS: 1.0000 | ORAL_TABLET | Freq: Four times a day (QID) | ORAL | Status: DC | PRN

## 2011-12-09 NOTE — ED Provider Notes (Signed)
History     CSN: [id number]  Arrival date & time 12/09/11  1315   First MD Initiated Contact with Patient 12/09/11 1407      Chief Complaint  Patient presents with  . Ankle Pain  . Hypertension    (Consider location/radiation/quality/duration/timing/severity/associated sxs/prior treatment) Patient is a 46 y.o. female presenting with ankle pain and hypertension. The history is provided by the patient.  Ankle Pain  Pertinent negatives include no numbness.  Hypertension Pertinent negatives include no chest pain, no abdominal pain, no headaches and no shortness of breath.   patient's had left ankle pain since it gave out a week ago. It is painful to walk. No numbness or weakness. No other injury. She states her blood pressures also behind. No chest pain dizziness numbness or weakness. No headache. He's also had mass in her left breast for the last few days. He states she just noticed a few days ago. She thinks it may have gotten bigger since then. No nipple drainage. No skin changes. She's not had masses like this before.  Past Medical History  Diagnosis Date  . Alcohol use   . Diabetes mellitus, type 2   . Obesity   . Hyperlipidemia   . Hypertension     Past Surgical History  Procedure Date  . Dilation and curettage of uterus     Family History  Problem Relation Age of Onset  . Hypertension Mother   . Hypertension Father     CABG   . Hypertension Sister   . Heart attack Mother     History  Substance Use Topics  . Smoking status: Current Everyday Smoker -- 1.0 packs/day  . Smokeless tobacco: Not on file  . Alcohol Use: No    OB History    Grav Para Term Preterm Abortions TAB SAB Ect Mult Living                  Review of Systems  Constitutional: Negative for activity change and appetite change.  HENT: Negative for neck stiffness.   Eyes: Negative for pain.  Respiratory: Negative for chest tightness and shortness of breath.   Cardiovascular: Negative for  chest pain and leg swelling.  Gastrointestinal: Negative for nausea, vomiting, abdominal pain and diarrhea.  Genitourinary: Negative for flank pain.  Musculoskeletal: Positive for joint swelling and gait problem. Negative for back pain.  Skin: Negative for rash.  Neurological: Negative for weakness, numbness and headaches.  Psychiatric/Behavioral: Negative for behavioral problems.    Allergies  Review of patient's allergies indicates no known allergies.  Home Medications   Current Outpatient Rx  Name Route Sig Dispense Refill  . ALPRAZOLAM 0.5 MG PO TABS  TAKE 1 TABLET BY MOUTH 3 TIMES A DAY. 90 tablet 1  . ASPIRIN 81 MG PO TBEC Oral Take 81 mg by mouth daily. One tablet by mouth once daily     . ASPIRIN 325 MG PO TABS Oral Take 325 mg by mouth daily. Once daily     . ASTELIN 137 MCG/SPRAY NA SOLN  USE 2 SPRAYS IN EACH NOSTRIL TWICE DAILY. 1 Bottle 2  . CARISOPRODOL 350 MG PO TABS Oral Take 350 mg by mouth. Take one tablet by     . CETIRIZINE HCL 10 MG PO TABS Oral Take 10 mg by mouth daily.      Marland Kitchen CLONIDINE HCL 0.3 MG PO TABS Oral Take 1 tablet (0.3 mg total) by mouth 2 (two) times daily. 60 tablet 11    Dose  increase effective 03/16/2011  . CYCLOBENZAPRINE HCL 10 MG PO TABS Oral Take 1 tablet (10 mg total) by mouth daily as needed for muscle spasms. 30 tablet 1  . BYETTA 10 MCG PEN [place] Subcutaneous Inject 5 Units into the skin 2 (two) times daily.      . FUROSEMIDE 40 MG PO TABS Oral Take 40 mg by mouth daily. One tablet by mouth daily     . GLIPIZIDE ER 5 MG PO TB24 Oral Take 5 mg by mouth daily.      Marland Kitchen HYDROXYZINE HCL PO Oral Take by mouth.      . IBUPROFEN 800 MG PO TABS Oral Take 800 mg by mouth every 6 (six) hours as needed.      Marland Kitchen LOSARTAN POTASSIUM 100 MG PO TABS Oral Take 1 tablet (100 mg total) by mouth daily. 30 tablet 11  . LOVASTATIN 40 MG PO TABS Oral Take 40 mg by mouth at bedtime. Take two tablets by mouth at bed time     . METFORMIN HCL 1000 MG PO TABS Oral Take 1,000  mg by mouth 2 (two) times daily with meals. Take one tablet by mouth two times a day       . MONTELUKAST SODIUM 10 MG PO TABS Oral Take 10 mg by mouth at bedtime. Take one tablet by mouth once daily       . OMEPRAZOLE 20 MG PO TBEC Oral Take by mouth. Take one cap by mouth once daily     . OXYCODONE-ACETAMINOPHEN 5-325 MG PO TABS Oral Take 1-2 tablets by mouth every 6 (six) hours as needed for pain. 20 tablet 0  . PAROXETINE HCL 20 MG PO TABS Oral Take 20 mg by mouth every morning. Take one tablet by mouth once daily       . POTASSIUM CHLORIDE CRYS ER 20 MEQ PO TBCR Oral Take 20 mEq by mouth 2 (two) times daily. One tablet by mouth two times a day     . ROSUVASTATIN CALCIUM 10 MG PO TABS Oral Take 1 tablet (10 mg total) by mouth at bedtime. D/c Trilipix 30 tablet 4  . TRAMADOL HCL 50 MG PO TBSO Oral Take by mouth. One tablet by mouth twice by daily as needed     . TRIAMTERENE-HCTZ 37.5-25 MG PO TABS Oral Take 1 tablet by mouth daily. 30 tablet 3    Reduced dose, pt to start topday 04/21/2011  . ULTRAM 50 MG PO TABS  TAKE (1) TABLET BY MOUTH TWICE A DAY AS NEEDED. 30 each 1    BP 185/114  Pulse 107  Temp(Src) 98 F (36.7 C) (Oral)  Resp 20  Ht 5\' 6"  (1.676 m)  Wt 218 lb (98.884 kg)  BMI 35.19 kg/m2  SpO2 100%  Physical Exam  Nursing note and vitals reviewed. Constitutional: She is oriented to person, place, and time. She appears well-developed and well-nourished.  HENT:  Head: Normocephalic and atraumatic.  Eyes: EOM are normal. Pupils are equal, round, and reactive to light.  Neck: Normal range of motion. Neck supple.  Cardiovascular: Normal rate, regular rhythm and normal heart sounds.   No murmur heard. Pulmonary/Chest: Effort normal and breath sounds normal. No respiratory distress. She has no wheezes. She has no rales.       Approximately 3 cm mass behind left nipple. Tender, firm. No skin changes. No nipple drainage. No lymphadenopathy in left axilla.  Abdominal: Soft. Bowel  sounds are normal. She exhibits no distension. There is no  tenderness. There is no rebound and no guarding.  Musculoskeletal: Normal range of motion.       Tenderness over left ankle laterally and somewhat tender medially. Skin is intact. Neurovascular intact. Mild tenderness over head of left fibula also. Range of motion intact in knee.  Neurological: She is alert and oriented to person, place, and time. No cranial nerve deficit.  Skin: Skin is warm and dry.  Psychiatric: She has a normal mood and affect. Her speech is normal.    ED Course  Procedures (including critical care time)  Labs Reviewed - No data to display Dg Tibia/fibula Left  12/09/2011  *RADIOLOGY REPORT*  Clinical Data: Fall, left ankle pain.  LEFT TIBIA AND FIBULA - 2 VIEW  Comparison: 12/09/2011  Findings: There is a minimally-displaced fracture through the distal left fibula.  This enters the ankle mortise.  No tibial abnormality is seen.  Severe pes planus deformity of the foot.  Diffuse soft tissue swelling about the ankle.  IMPRESSION: Oblique minimally-displaced fracture through the distal left fibula.  Severe pes planus deformity of the hind foot.  Original Report Authenticated By: Cyndie Chime, M.D.   Dg Ankle Complete Left  12/09/2011  *RADIOLOGY REPORT*  Clinical Data: Fall with left ankle pain and swelling.  LEFT ANKLE COMPLETE - 3+ VIEW  Comparison: None.  Findings: There is a minimally-displaced fracture of the distal fibular metaphysis with overlying soft tissue swelling.  Difficult to definitively exclude a nondisplaced avulsion fracture of the medial malleolus.  Diffuse soft tissue swelling in the lower leg and ankle.  Lateral view of the ankle shows a rocker bottom deformity of the foot.  IMPRESSION:  1.  Minimally displaced fracture of the distal fibular metaphysis. 2.  Difficult to definitively exclude nondisplaced avulsion fracture off the medial malleolus. 3.  Diffuse soft tissue swelling.  Original Report  Authenticated By: Reyes Ivan, M.D.     1. Ankle fracture   2. Hypertension   3. Breast mass       MDM  Left ankle injury. Lateral malleolus fracture and possible small avulsion fracture medial malleolus. Patient was put in the [facility] and follow with orthopedic she also has a breast mass. She'll follow with her primary care doctor for this. Patient also has hypertension. She is on medicines already for this. Chest pain or evidence of endorgan damage. It is also likely worsened due to the pain in her ankle. She'll follow up with her primary care for this too.        Juliet Rude. Rubin Payor, MD 12/09/11 1539

## 2011-12-09 NOTE — ED Notes (Signed)
Patient states she is on blood pressure medication but has not taken it in about 1 month.

## 2011-12-09 NOTE — ED Notes (Signed)
Initially pt states she does not want to be treated for her hypertension, states she will restart her blood pressure medications at home. Pt has now decided to be treated for her elevated bp.

## 2011-12-09 NOTE — Discharge Instructions (Signed)
Follow with Dr. Romeo Apple for the ankle, in Dr. [name] for the high blood pressure and breast mass.   Ankle Fracture A fracture is a break in the bone. A cast or splint is used to protect and keep your injured bone from moving.  HOME CARE INSTRUCTIONS   Use your crutches as directed.   To lessen the swelling, keep the injured leg elevated while sitting or lying down.   Apply ice to the injury for 15 to 20 minutes, 3 to 4 times per day while awake for 2 days. Put the ice in a plastic bag and place a thin towel between the bag of ice and your cast.   If you have a plaster or fiberglass cast:   Do not try to scratch the skin under the cast using sharp or pointed objects.   Check the skin around the cast every day. You may put lotion on any red or sore areas.   Keep your cast dry and clean.   If you have a plaster splint:   Wear the splint as directed.   You may loosen the elastic around the splint if your toes become numb, tingle, or turn cold or blue.   Do not put pressure on any part of your cast or splint; it may break. Rest your cast only on a pillow the first 24 hours until it is fully hardened.   Your cast or splint can be protected during bathing with a plastic bag. Do not lower the cast or splint into water.   Take medications as directed by your caregiver. Only take over-the-counter or prescription medicines for pain, discomfort, or fever as directed by your caregiver.   Do not drive a vehicle until your caregiver specifically tells you it is safe to do so.   If your caregiver has given you a follow-up appointment, it is very important to keep that appointment. Not keeping the appointment could result in a chronic or permanent injury, pain, and disability. If there is any problem keeping the appointment, you must call back to this facility for assistance.  SEEK IMMEDIATE MEDICAL CARE IF:   Your cast gets damaged or breaks.   You have continued severe pain or more  swelling than you did before the cast was put on.   Your skin or toenails below the injury turn blue or gray, or feel cold or numb.   There is a bad smell or new stains and/or purulent (pus like) drainage coming from under the cast.  If you do not have a window in your cast for observing the wound, a discharge or minor bleeding may show up as a stain on the outside of your cast. Report these findings to your caregiver. MAKE SURE YOU:   Understand these instructions.   Will watch your condition.   Will get help right away if you are not doing well or get worse.  Document Released: 10/09/2000 Document Revised: 06/24/2011 Document Reviewed: 05/15/2008 [facility] Patient Information 2012 [place], [place].Arterial Hypertension Arterial hypertension (high blood pressure) is a condition of elevated pressure in your blood vessels. Hypertension over a long period of time is a risk factor for strokes, heart attacks, and heart failure. It is also the leading cause of kidney (renal) failure.  CAUSES   In Adults -- Over 90% of all hypertension has no known cause. This is called essential or primary hypertension. In the other 10% of people with hypertension, the increase in blood pressure is caused by another disorder. This  is called secondary hypertension. Important causes of secondary hypertension are:   Heavy alcohol use.   Obstructive sleep apnea.   Hyperaldosterosim (Conn's syndrome).   Steroid use.   Chronic kidney failure.   Hyperparathyroidism.   Medications.   Renal artery stenosis.   Pheochromocytoma.   Cushing's disease.   Coarctation of the aorta.   Scleroderma renal crisis.   Licorice (in excessive amounts).   Drugs (cocaine, methamphetamine).  Your caregiver can explain any items above that apply to you.  In Children -- Secondary hypertension is more common and should always be considered.   Pregnancy -- Few women of childbearing age have high blood pressure. However,  up to 10% of them develop hypertension of pregnancy. Generally, this will not harm the woman. It may be a sign of 3 complications of pregnancy: preeclampsia, HELLP syndrome, and eclampsia. Follow up and control with medication is necessary.  SYMPTOMS   This condition normally does not produce any noticeable symptoms. It is usually found during a routine exam.   Malignant hypertension is a late problem of high blood pressure. It may have the following symptoms:   Headaches.   Blurred vision.   End-organ damage (this means your kidneys, heart, lungs, and other organs are being damaged).   Stressful situations can increase the blood pressure. If a person with normal blood pressure has their blood pressure go up while being seen by their caregiver, this is often termed "white coat hypertension." Its importance is not known. It may be related with eventually developing hypertension or complications of hypertension.   Hypertension is often confused with mental tension, stress, and anxiety.  DIAGNOSIS  The diagnosis is made by 3 separate blood pressure measurements. They are taken at least 1 week apart from each other. If there is organ damage from hypertension, the diagnosis may be made without repeat measurements. Hypertension is usually identified by having blood pressure readings:  Above 140/90 mmHg measured in both arms, at 3 separate times, over a couple weeks.   Over 130/80 mmHg should be considered a risk factor and may require treatment in patients with diabetes.  Blood pressure readings over 120/80 mmHg are called "pre-hypertension" even in non-diabetic patients. To get a true blood pressure measurement, use the following guidelines. Be aware of the factors that can alter blood pressure readings.  Take measurements at least 1 hour after caffeine.   Take measurements 30 minutes after smoking and without any stress. This is another reason to quit smoking - it raises your blood pressure.     Use a proper cuff size. Ask your caregiver if you are not sure about your cuff size.   Most home blood pressure cuffs are automatic. They will measure systolic and diastolic pressures. The systolic pressure is the pressure reading at the start of sounds. Diastolic pressure is the pressure at which the sounds disappear. If you are elderly, measure pressures in multiple postures. Try sitting, lying or standing.   Sit at rest for a minimum of 5 minutes before taking measurements.   You should not be on any medications like decongestants. These are found in many cold medications.   Record your blood pressure readings and review them with your caregiver.  If you have hypertension:  Your caregiver may do tests to be sure you do not have secondary hypertension (see "causes" above).   Your caregiver may also look for signs of metabolic syndrome. This is also called Syndrome X or Insulin Resistance Syndrome. You may have this  syndrome if you have type 2 diabetes, abdominal obesity, and abnormal blood lipids in addition to hypertension.   Your caregiver will take your medical and family history and perform a physical exam.   Diagnostic tests may include blood tests (for glucose, cholesterol, potassium, and kidney function), a urinalysis, or an EKG. Other tests may also be necessary depending on your condition.  PREVENTION  There are important lifestyle issues that you can adopt to reduce your chance of developing hypertension:  Maintain a normal weight.   Limit the amount of salt (sodium) in your diet.   Exercise often.   Limit alcohol intake.   Get enough potassium in your diet. Discuss specific advice with your caregiver.   Follow a DASH diet (dietary approaches to stop hypertension). This diet is rich in fruits, vegetables, and low-fat dairy products, and avoids certain fats.  PROGNOSIS  Essential hypertension cannot be cured. Lifestyle changes and medical treatment can lower blood  pressure and reduce complications. The prognosis of secondary hypertension depends on the underlying cause. Many people whose hypertension is controlled with medicine or lifestyle changes can live a normal, healthy life.  RISKS AND COMPLICATIONS  While high blood pressure alone is not an illness, it often requires treatment due to its short- and long-term effects on many organs. Hypertension increases your risk for:  CVAs or strokes (cerebrovascular accident).   Heart failure due to chronically high blood pressure (hypertensive cardiomyopathy).   Heart attack (myocardial infarction).   Damage to the retina (hypertensive retinopathy).   Kidney failure (hypertensive nephropathy).  Your caregiver can explain list items above that apply to you. Treatment of hypertension can significantly reduce the risk of complications. TREATMENT   For overweight patients, weight loss and regular exercise are recommended. Physical fitness lowers blood pressure.   Mild hypertension is usually treated with diet and exercise. A diet rich in fruits and vegetables, fat-free dairy products, and foods low in fat and salt (sodium) can help lower blood pressure. Decreasing salt intake decreases blood pressure in a 1/3 of people.   Stop smoking if you are a smoker.  The steps above are highly effective in reducing blood pressure. While these actions are easy to suggest, they are difficult to achieve. Most patients with moderate or severe hypertension end up requiring medications to bring their blood pressure down to a normal level. There are several classes of medications for treatment. Blood pressure pills (antihypertensives) will lower blood pressure by their different actions. Lowering the blood pressure by 10 mmHg may decrease the risk of complications by as much as 25%. The goal of treatment is effective blood pressure control. This will reduce your risk for complications. Your caregiver will help you determine the  best treatment for you according to your lifestyle. What is excellent treatment for one person, may not be for you. HOME CARE INSTRUCTIONS   Do not smoke.   Follow the lifestyle changes outlined in the "Prevention" section.   If you are on medications, follow the directions carefully. Blood pressure medications must be taken as prescribed. Skipping doses reduces their benefit. It also puts you at risk for problems.   Follow up with your caregiver, as directed.   If you are asked to monitor your blood pressure at home, follow the guidelines in the "Diagnosis" section above.  SEEK MEDICAL CARE IF:   You think you are having medication side effects.   You have recurrent headaches or lightheadedness.   You have swelling in your ankles.  You have trouble with your vision.  SEEK IMMEDIATE MEDICAL CARE IF:   You have sudden onset of chest pain or pressure, difficulty breathing, or other symptoms of a heart attack.   You have a severe headache.   You have symptoms of a stroke (such as sudden weakness, difficulty speaking, difficulty walking).  MAKE SURE YOU:   Understand these instructions.   Will watch your condition.   Will get help right away if you are not doing well or get worse.  Document Released: 10/12/2005 Document Revised: 06/24/2011 Document Reviewed: 05/12/2007 [facility] Patient Information 2012 [place], [place].

## 2011-12-09 NOTE — ED Notes (Signed)
Patient requesting something for pain before discharge, EDP made aware.

## 2011-12-09 NOTE — ED Notes (Signed)
Pt c/o left ankle pain/swelling since falling x 1 week ago. Pt also c/o "lump" in left breast-tender to touch.

## 2011-12-15 ENCOUNTER — Ambulatory Visit (INDEPENDENT_AMBULATORY_CARE_PROVIDER_SITE_OTHER): Payer: Medicare Other | Admitting: Family Medicine

## 2011-12-15 ENCOUNTER — Encounter: Payer: Self-pay | Admitting: Family Medicine

## 2011-12-15 ENCOUNTER — Other Ambulatory Visit: Payer: Self-pay | Admitting: Family Medicine

## 2011-12-15 VITALS — BP 202/116 | HR 110 | Resp 18 | Ht 61.0 in | Wt 211.1 lb

## 2011-12-15 DIAGNOSIS — N6320 Unspecified lump in the left breast, unspecified quadrant: Secondary | ICD-10-CM | POA: Insufficient documentation

## 2011-12-15 DIAGNOSIS — E119 Type 2 diabetes mellitus without complications: Secondary | ICD-10-CM

## 2011-12-15 DIAGNOSIS — N63 Unspecified lump in unspecified breast: Secondary | ICD-10-CM

## 2011-12-15 DIAGNOSIS — IMO0002 Reserved for concepts with insufficient information to code with codable children: Secondary | ICD-10-CM

## 2011-12-15 DIAGNOSIS — F17200 Nicotine dependence, unspecified, uncomplicated: Secondary | ICD-10-CM

## 2011-12-15 DIAGNOSIS — I10 Essential (primary) hypertension: Secondary | ICD-10-CM

## 2011-12-15 DIAGNOSIS — S82892A Other fracture of left lower leg, initial encounter for closed fracture: Secondary | ICD-10-CM

## 2011-12-15 DIAGNOSIS — M899 Disorder of bone, unspecified: Secondary | ICD-10-CM

## 2011-12-15 DIAGNOSIS — E785 Hyperlipidemia, unspecified: Secondary | ICD-10-CM

## 2011-12-15 DIAGNOSIS — M949 Disorder of cartilage, unspecified: Secondary | ICD-10-CM

## 2011-12-15 MED ORDER — CLONIDINE HCL 0.2 MG PO TABS: ORAL_TABLET | ORAL | Status: DC

## 2011-12-15 MED ORDER — BENAZEPRIL-HYDROCHLOROTHIAZIDE 20-12.5 MG PO TABS: ORAL_TABLET | ORAL | Status: DC

## 2011-12-15 MED ORDER — OXYCODONE-ACETAMINOPHEN 5-325 MG PO TABS: 1.0000 | ORAL_TABLET | Freq: Four times a day (QID) | ORAL | Status: DC | PRN

## 2011-12-15 NOTE — Patient Instructions (Addendum)
F/u in 5 weeks.  New medication for blood pressure which is dangerously high. If you develop headache, blurry vision, difficulty with speech or numbness immediately go to the hospital.  Do NOT STOP YOUR BLOOD PRESSURE MEDICATION  You are being referred for amammogram due to left breast lump.  You are referred to Dr Romeo Apple due to left ankle injury  Labs today ccmp and EGFR, HBA1C , vit D, lipid panel

## 2011-12-16 ENCOUNTER — Ambulatory Visit (HOSPITAL_COMMUNITY)
Admission: RE | Admit: 2011-12-16 | Discharge: 2011-12-16 | Disposition: A | Discharge status: Home / Self Care | Payer: PRIVATE HEALTH INSURANCE | Source: Ambulatory Visit | Attending: Family Medicine | Admitting: Family Medicine

## 2011-12-16 ENCOUNTER — Other Ambulatory Visit: Payer: Self-pay | Admitting: Family Medicine

## 2011-12-16 ENCOUNTER — Telehealth: Payer: Self-pay

## 2011-12-16 DIAGNOSIS — N61 Mastitis without abscess: Secondary | ICD-10-CM | POA: Insufficient documentation

## 2011-12-16 DIAGNOSIS — IMO0002 Reserved for concepts with insufficient information to code with codable children: Secondary | ICD-10-CM

## 2011-12-16 DIAGNOSIS — E559 Vitamin D deficiency, unspecified: Secondary | ICD-10-CM | POA: Insufficient documentation

## 2011-12-16 LAB — COMPLETE METABOLIC PANEL WITH GFR
ALT: 13 U/L (ref 0–35)
AST: 11 U/L (ref 0–37)
Albumin: 3.4 g/dL — ABNORMAL LOW (ref 3.5–5.2)
Alkaline Phosphatase: 112 U/L (ref 39–117)
BUN: 8 mg/dL (ref 6–23)
CO2: 25 mEq/L (ref 19–32)
Calcium: 8.9 mg/dL (ref 8.4–10.5)
Chloride: 104 mEq/L (ref 96–112)
Creat: 0.76 mg/dL (ref 0.50–1.10)
GFR, Est African American: >89 mL/min
GFR, Est Non African American: >89 mL/min
Glucose, Bld: 291 mg/dL — ABNORMAL HIGH (ref 70–99)
Potassium: 3.7 mEq/L (ref 3.5–5.3)
Sodium: 142 mEq/L (ref 135–145)
Total Bilirubin: 0.6 mg/dL (ref 0.3–1.2)
Total Protein: 6.1 g/dL (ref 6.0–8.3)

## 2011-12-16 LAB — HEMOGLOBIN A1C
Hgb A1c MFr Bld: 11.7 % — ABNORMAL HIGH (ref <5.7)
Mean Plasma Glucose: 289 mg/dL — ABNORMAL HIGH (ref <117)

## 2011-12-16 LAB — LIPID PANEL
Cholesterol: 249 mg/dL — ABNORMAL HIGH (ref 0–200)
HDL: 54 mg/dL (ref >39)
LDL Cholesterol: 144 mg/dL — ABNORMAL HIGH (ref 0–99)
Total CHOL/HDL Ratio: 4.6 Ratio
Triglycerides: 255 mg/dL — ABNORMAL HIGH (ref <150)
VLDL: 51 mg/dL — ABNORMAL HIGH (ref 0–40)

## 2011-12-16 LAB — VITAMIN D 25 HYDROXY (VIT D DEFICIENCY, FRACTURES): Vit D, 25-Hydroxy: 11 ng/mL — ABNORMAL LOW (ref 30–89)

## 2011-12-16 MED ORDER — FLUCONAZOLE 150 MG PO TABS: ORAL_TABLET | ORAL | Status: AC

## 2011-12-16 MED ORDER — VITAMIN D3 1.25 MG (50000 UT) PO CAPS: 50000.0000 [IU] | ORAL_CAPSULE | ORAL | Status: DC

## 2011-12-16 MED ORDER — PRAVASTATIN SODIUM 40 MG PO TABS: 40.0000 mg | ORAL_TABLET | Freq: Every evening | ORAL | Status: DC

## 2011-12-16 MED ORDER — DOXYCYCLINE HYCLATE 100 MG PO TABS: 100.0000 mg | ORAL_TABLET | Freq: Two times a day (BID) | ORAL | Status: AC

## 2011-12-16 NOTE — Assessment & Plan Note (Signed)
Uncontrolled pt to resume med Hyperlipidemia:Low fat diet discussed and encouraged.
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
## 2011-12-16 NOTE — Assessment & Plan Note (Signed)
Unchanged, no quit date set, counseled

## 2011-12-16 NOTE — Telephone Encounter (Signed)
Called to notify pt. No answer left message to return call.
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
## 2011-12-16 NOTE — Telephone Encounter (Signed)
pls let her know antibiotic and pill for yeast infection if needed sent in.  She needs office f/u with me next week.  Has not used xanax in the past 6 months, needs to focus on blood pressure, diabetes and other problems, I will not be   Sending in xanax at this time.  This can be re addressed when she returns for her next visit to f/u this abcess

## 2011-12-16 NOTE — Discharge Instructions (Signed)
Fine Needle Aspiration Fine needle aspiration is a procedure used to remove a piece of tissue. The tissue may be removed from a swelling or abnormal growth (tumor). It is also used to confirm a cyst. A cyst is a fluid filled sac. The procedure may be done by your caregiver or a specialist. LET YOUR CAREGIVER KNOW ABOUT:   Any medications you take, especially blood thinners like aspirin.   Any problems you may have had with similar procedures in the past.  RISKS AND COMPLICATIONS This is a safe procedure. There is a very small risk of infection and or bleeding. Other complications can occur if the aspiration site is deep in the body. Your caregiver or specialist will explain this to you.  BEFORE THE PROCEDURE   No special preparation is needed in most cases. Your caregiver will let you know if there are special requirements, such as an empty stomach.   Be sure to ask your caregiver any questions before the procedure starts.  PROCEDURE   This procedure is done under local or no anesthetic.   The skin is cleaned carefully.   A thin needle is directed into a lump. The needle is directed in several different directions into the lump while suction is applied to the needle. When samples are removed, the needle is withdrawn.   The contents obtained are placed on a slide. They are then fixed, stained and examined under the microscope. The slide is examined by a specialist in the examination of tissue (pathologist).   A diagnosis can then be made. The pathologist will decide if the specimen is cancerous (malignant) or not cancerous (benign). If fluid is taken from a cyst, cells from the fluid can be examined. If no material is obtained from a fine needle aspiration, the sample may not rule out a problem. Sometimes the procedure is done again. The pathologist may need several days before a result is available.  AFTER THE PROCEDURE   There are usually no limits on diet or activity after a fine needle  aspiration.   Your caregiver may give you instructions regarding the aspiration site. This will include information about keeping the site clean and dry. Follow these instructions carefully.   Call for your test results as instructed by your caregiver. Remember, it is your responsibility to get the results of your testing. Do not think everything is fine if you have not heard from your caregiver.   Keep a close watch on the aspiration site. Report any redness, swelling or drainage.   You should not have much pain at the aspiration site.   Take any medications as told by your caregiver.  SEEK MEDICAL CARE IF:   You have pain or drainage at the aspiration site that does not go away.   Swelling at the aspiration site does not gradually go away.  SEEK IMMEDIATE MEDICAL CARE IF:   You develop a fever a day or two after the aspiration.   You develop severe pain at the aspiration site.   You develop a warm, tender swelling at the aspiration site.  Document Released: 10/09/2000 Document Revised: 06/24/2011 Document Reviewed: 06/22/2008 [facility] Patient Information 2012 [place], [place].

## 2011-12-16 NOTE — Assessment & Plan Note (Signed)
Uncontrolled, non compliant, pt to return to Dr Fransico Him for management

## 2011-12-16 NOTE — Assessment & Plan Note (Signed)
Palpable breast mass, left, urgent imaging

## 2011-12-16 NOTE — Assessment & Plan Note (Signed)
Uncontrolled , importance of compliance stressed, med to be resumed

## 2011-12-17 ENCOUNTER — Ambulatory Visit (INDEPENDENT_AMBULATORY_CARE_PROVIDER_SITE_OTHER): Payer: Medicare Other | Admitting: Orthopedic Surgery

## 2011-12-17 ENCOUNTER — Ambulatory Visit: Payer: Medicare Other | Admitting: Orthopedic Surgery

## 2011-12-17 ENCOUNTER — Encounter: Payer: Self-pay | Admitting: Orthopedic Surgery

## 2011-12-17 VITALS — BP 150/100 | Ht 61.0 in | Wt 211.0 lb

## 2011-12-17 DIAGNOSIS — S82892A Other fracture of left lower leg, initial encounter for closed fracture: Secondary | ICD-10-CM

## 2011-12-17 DIAGNOSIS — S82899A Other fracture of unspecified lower leg, initial encounter for closed fracture: Secondary | ICD-10-CM

## 2011-12-17 NOTE — Patient Instructions (Signed)
Wear brace for any walking

## 2011-12-17 NOTE — Progress Notes (Addendum)
  Subjective:    [name] is a 46 y.o. female who presents with basically a two-week old ankles injury which turned out to be a lateral malleolus fracture.  She has a Charcot foot.  She came down some stairs twisted her LEFT ankle 2 weeks ago walked on a four-week the swelling didn't give better she went to the hospital x-rays showed a nondisplaced fibular fracture with an underlying Charcot foot.  She is on hydrocodone.  She complains of delayed out of 10 pain associated with catching swelling locking numbness and tingling although some of these symptoms are chronic.  She was placed in a Cam Walker and has been weightbearing in that.  She tells me that she had special shoes that were made for her but she cannot get them to be fitted properly so she does not have a appropriate shoe when she is not entered.  The following portions of the patient's history were reviewed and updated as appropriate: allergies, current medications, past family history, past medical history, past social history, past surgical history and problem list.   Review of systems positive findings are excessive thirst and urination temperature intolerance seasonal ALLERGIES, anxiety and depression, joint pain and swelling.  Urgency.  Blood in her stools.  Cough.  Snoring.  Blurred vision and fatigue.   Objective:    BP 150/100  Ht 5\' 1"  (1.549 m)  Wt 211 lb (95.709 kg)  BMI 39.87 kg/m2  LMP 12/11/2011  Vital signs are stable as recorded  General appearance is normal  The patient is alert and oriented x3  The patient's mood and affect are normal  Gait assessment: I actually saw her ambulating with her brace off and encouraged her not to do that. The cardiovascular exam reveals normal pulses and temperature without edema swelling.  The lymphatic system is negative for palpable lymph nodes  The sensory exam is normal.  There are no pathologic reflexes.  Balance is normal.  Upper extremity exam  Inspection  and palpation revealed no abnormalities in the upper extremities.  Range of motion is full without contracture.  Motor exam is normal with grade 5 strength.  The joints are fully reduced without subluxation.  There is no atrophy or tremor and muscle tone is normal.  All joints are stable.   Right foot Inspection was normal, range of motion was full.  Ankle joint was stable.  Strength was normal.  Skin was intact.  Exam of the her LEFT foot is in a rigid pes planus with bulbous deformity of the medial part of the ankle joint.  She has an adequate range of motion her ankle joint is stable her subtalar joint is unstable her muscle tone is normal his skin is intact though callused.  There is mild swelling and there is tenderness over the fibula.  Imaging: X-ray of the left ankle(s): the x-ray at the hospital shows a pes planus to Charcot foot and a nondisplaced fibular fracture    Assessment:    Charcot foot with LEFT ankle fibular fracture    Plan:    brace the foot and ankle an x-ray in a week of this fracture has demonstrated some stability as it was nondisplaced after a week of ambulation.  I emphasized the significance of her injury with the underlying Charcot disease and the risk of amputation

## 2011-12-17 NOTE — Telephone Encounter (Signed)
Pt aware.

## 2011-12-20 ENCOUNTER — Emergency Department (HOSPITAL_COMMUNITY): Payer: PRIVATE HEALTH INSURANCE

## 2011-12-20 ENCOUNTER — Emergency Department (HOSPITAL_COMMUNITY)
Admission: EM | Admit: 2011-12-20 | Discharge: 2011-12-21 | Disposition: A | Discharge status: Home / Self Care | Payer: PRIVATE HEALTH INSURANCE | Attending: Emergency Medicine | Admitting: Emergency Medicine

## 2011-12-20 ENCOUNTER — Encounter (HOSPITAL_COMMUNITY): Payer: Self-pay | Admitting: Emergency Medicine

## 2011-12-20 DIAGNOSIS — E119 Type 2 diabetes mellitus without complications: Secondary | ICD-10-CM | POA: Insufficient documentation

## 2011-12-20 DIAGNOSIS — M79609 Pain in unspecified limb: Secondary | ICD-10-CM | POA: Insufficient documentation

## 2011-12-20 DIAGNOSIS — E785 Hyperlipidemia, unspecified: Secondary | ICD-10-CM | POA: Insufficient documentation

## 2011-12-20 DIAGNOSIS — S9000XA Contusion of unspecified ankle, initial encounter: Secondary | ICD-10-CM | POA: Insufficient documentation

## 2011-12-20 DIAGNOSIS — Z79899 Other long term (current) drug therapy: Secondary | ICD-10-CM | POA: Insufficient documentation

## 2011-12-20 DIAGNOSIS — M25476 Effusion, unspecified foot: Secondary | ICD-10-CM | POA: Insufficient documentation

## 2011-12-20 DIAGNOSIS — M7989 Other specified soft tissue disorders: Secondary | ICD-10-CM | POA: Insufficient documentation

## 2011-12-20 DIAGNOSIS — S82409A Unspecified fracture of shaft of unspecified fibula, initial encounter for closed fracture: Secondary | ICD-10-CM | POA: Insufficient documentation

## 2011-12-20 DIAGNOSIS — I10 Essential (primary) hypertension: Secondary | ICD-10-CM | POA: Insufficient documentation

## 2011-12-20 DIAGNOSIS — M25579 Pain in unspecified ankle and joints of unspecified foot: Secondary | ICD-10-CM | POA: Insufficient documentation

## 2011-12-20 DIAGNOSIS — R6884 Jaw pain: Secondary | ICD-10-CM | POA: Insufficient documentation

## 2011-12-20 DIAGNOSIS — M25473 Effusion, unspecified ankle: Secondary | ICD-10-CM | POA: Insufficient documentation

## 2011-12-20 LAB — CULTURE, ROUTINE-ABSCESS

## 2011-12-20 MED ORDER — OXYCODONE-ACETAMINOPHEN 5-325 MG PO TABS
1.0000 | ORAL_TABLET | Freq: Once | ORAL | Status: AC
  Administered 2011-12-20: 1 via ORAL
  Filled 2011-12-20: qty 1
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
## 2011-12-20 NOTE — ED Notes (Signed)
Patient states hasn't taken blood pressure medication yet.
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
## 2011-12-21 MED ORDER — OXYCODONE-ACETAMINOPHEN 5-325 MG PO TABS: 1.0000 | ORAL_TABLET | ORAL | Status: AC | PRN

## 2011-12-21 NOTE — ED Notes (Signed)
Pt alert & oriented x4, stable gait. Pt given discharge instructions, paperwork & prescription(s). Patient instructed to stop at the registration desk to finish any additional paperwork. pt verbalized understanding. Pt left department w/ no further questions.  
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
## 2011-12-24 ENCOUNTER — Encounter: Payer: Self-pay | Admitting: Orthopedic Surgery

## 2011-12-24 ENCOUNTER — Ambulatory Visit: Payer: Medicare Other | Admitting: Orthopedic Surgery

## 2011-12-30 ENCOUNTER — Ambulatory Visit (INDEPENDENT_AMBULATORY_CARE_PROVIDER_SITE_OTHER): Payer: Medicare Other | Admitting: Orthopedic Surgery

## 2011-12-30 ENCOUNTER — Encounter: Payer: Self-pay | Admitting: Orthopedic Surgery

## 2011-12-30 VITALS — BP 172/100 | Ht 61.0 in | Wt 209.4 lb

## 2011-12-30 DIAGNOSIS — E11610 Type 2 diabetes mellitus with diabetic neuropathic arthropathy: Secondary | ICD-10-CM

## 2011-12-30 DIAGNOSIS — S8263XA Displaced fracture of lateral malleolus of unspecified fibula, initial encounter for closed fracture: Secondary | ICD-10-CM

## 2011-12-30 HISTORY — DX: Type 2 diabetes mellitus with diabetic neuropathic arthropathy: E11.610

## 2011-12-30 MED ORDER — HYDROCODONE-ACETAMINOPHEN 5-325 MG PO TABS: 1.0000 | ORAL_TABLET | ORAL | Status: AC | PRN

## 2011-12-30 NOTE — Progress Notes (Signed)
Patient ID: [name], female   DOB: 1966-06-13, 46 y.o.   MRN: [id number] Chief Complaint  Patient presents with  . Ankle Injury    X-rays today for ankle fracture LEFT lower extremity as well as Charcot foot   The patient injured her foot weeks ago presented about 2 weeks ago missed last week's appointment she is in a Manufacturing systems engineer for Charcot foot and the LEFT ankle lateral malleolus fracture.  She complains of pain.  She was on Percocet which she got from the ER she didn't get any medicine on her last visit and would like some this time.  She complains of pain in her Achilles.  She would like a new home health referral  Her fibula is tender her Achilles is tender.  She has a deformed foot from previous Charcot disease.  She has significantly less swelling in the foot and no tenderness at this time.  X-ray was obtained the fibula is healing.  There's been no progression of the Charcot disease.  Followup in 4 weeks for repeat x-ray continue Cam Walker

## 2011-12-30 NOTE — Patient Instructions (Signed)
Brace x 4 weeks   Hydrocodone for pain

## 2011-12-30 NOTE — Progress Notes (Signed)
Previous x-ray LEFT ankle  LEFT ankle fracture and Charcot foot  LEFT lateral malleolus fracture is healing with callous ankle mortise is intact  Previously noted Charcot changes were continued with no progression  Impression healing lateral malleolus fracture with underlying Charcot foot disease

## 2012-01-11 ENCOUNTER — Telehealth: Payer: Self-pay | Admitting: Family Medicine

## 2012-01-11 ENCOUNTER — Other Ambulatory Visit: Payer: Self-pay | Admitting: Family Medicine

## 2012-01-11 NOTE — Telephone Encounter (Signed)
Needs appt

## 2012-01-12 NOTE — Telephone Encounter (Signed)
Called patient and left message for them to return call at the office   

## 2012-01-14 NOTE — Telephone Encounter (Signed)
Pt has appt coming up to discuss

## 2012-01-20 ENCOUNTER — Ambulatory Visit: Payer: Medicare Other | Admitting: Family Medicine

## 2012-01-25 ENCOUNTER — Ambulatory Visit: Payer: PRIVATE HEALTH INSURANCE | Admitting: Family Medicine

## 2012-01-27 ENCOUNTER — Other Ambulatory Visit: Payer: Self-pay | Admitting: Orthopedic Surgery

## 2012-01-27 DIAGNOSIS — S82899A Other fracture of unspecified lower leg, initial encounter for closed fracture: Secondary | ICD-10-CM

## 2012-01-30 NOTE — Telephone Encounter (Signed)
Refilled 42 no refill

## 2012-02-03 ENCOUNTER — Ambulatory Visit: Payer: Medicare Other | Admitting: Orthopedic Surgery

## 2012-02-04 ENCOUNTER — Telehealth: Payer: Self-pay | Admitting: Orthopedic Surgery

## 2012-02-04 ENCOUNTER — Other Ambulatory Visit: Payer: Self-pay | Admitting: Family Medicine

## 2012-02-04 NOTE — Telephone Encounter (Addendum)
[name] called prior to her scheduled appointment 02/03/12 and stated that transportation service had gone to another address and would not make it back in time to pick her up for appointment. We had encouraged to check on any alternative transportation options and offered her other times for this date as well. She had been unable to find another ride, she had stated, and was given the first available re-scheduled appointment date 02/16/12 and is on our cancellation/wait list.  * Today, 02/04/12, [name] came in at end of clinic schedule to ask  (1)  for a new prescription for her pain medication, as said she had placed the original one on the hood of the car that she rode in today, and that the prescription blew away.    - I spoke with nurse in reference to this question and advised [name] per nurse that Dr. Romeo Apple is unable to issue another prescription at this time. (2) [name] also presented a form for home care as previously discussed with Dr. Romeo Apple.  We had been checking for an option for [name] which her Medicaid insurance would be accepted.  The family member that came with [name] today, [name] (niece), verified the home care information:  [facility], [facility], ph# [phone], fax [phone] at [address], [place] [address].   Routed to nurse and to Dr. for orders for home health and pertinent information for form. **SEE updated documentation note (Referral located in chart and faxed.

## 2012-02-04 NOTE — Telephone Encounter (Signed)
Referral located in chart dated 12/30/11. Faxed to Home care at fax # noted [phone]).  Patient aware.

## 2012-02-09 ENCOUNTER — Ambulatory Visit (INDEPENDENT_AMBULATORY_CARE_PROVIDER_SITE_OTHER): Payer: Medicare Other | Admitting: Family Medicine

## 2012-02-09 ENCOUNTER — Encounter (HOSPITAL_COMMUNITY): Payer: Self-pay

## 2012-02-09 ENCOUNTER — Encounter: Payer: Self-pay | Admitting: Family Medicine

## 2012-02-09 ENCOUNTER — Emergency Department (HOSPITAL_COMMUNITY)
Admission: EM | Admit: 2012-02-09 | Discharge: 2012-02-09 | Disposition: A | Discharge status: Home / Self Care | Payer: PRIVATE HEALTH INSURANCE | Attending: Emergency Medicine | Admitting: Emergency Medicine

## 2012-02-09 VITALS — BP 210/140 | HR 99 | Resp 18 | Ht 61.0 in | Wt 202.1 lb

## 2012-02-09 DIAGNOSIS — E119 Type 2 diabetes mellitus without complications: Secondary | ICD-10-CM

## 2012-02-09 DIAGNOSIS — M25579 Pain in unspecified ankle and joints of unspecified foot: Secondary | ICD-10-CM | POA: Insufficient documentation

## 2012-02-09 DIAGNOSIS — I10 Essential (primary) hypertension: Secondary | ICD-10-CM | POA: Insufficient documentation

## 2012-02-09 DIAGNOSIS — S8263XA Displaced fracture of lateral malleolus of unspecified fibula, initial encounter for closed fracture: Secondary | ICD-10-CM | POA: Insufficient documentation

## 2012-02-09 DIAGNOSIS — E11610 Type 2 diabetes mellitus with diabetic neuropathic arthropathy: Secondary | ICD-10-CM

## 2012-02-09 DIAGNOSIS — IMO0002 Reserved for concepts with insufficient information to code with codable children: Secondary | ICD-10-CM | POA: Insufficient documentation

## 2012-02-09 DIAGNOSIS — E1149 Type 2 diabetes mellitus with other diabetic neurological complication: Secondary | ICD-10-CM | POA: Insufficient documentation

## 2012-02-09 DIAGNOSIS — X58XXXA Exposure to other specified factors, initial encounter: Secondary | ICD-10-CM | POA: Insufficient documentation

## 2012-02-09 DIAGNOSIS — F17200 Nicotine dependence, unspecified, uncomplicated: Secondary | ICD-10-CM

## 2012-02-09 DIAGNOSIS — Z9119 Patient's noncompliance with other medical treatment and regimen: Secondary | ICD-10-CM

## 2012-02-09 DIAGNOSIS — Z79899 Other long term (current) drug therapy: Secondary | ICD-10-CM | POA: Insufficient documentation

## 2012-02-09 DIAGNOSIS — E785 Hyperlipidemia, unspecified: Secondary | ICD-10-CM | POA: Insufficient documentation

## 2012-02-09 DIAGNOSIS — T148XXA Other injury of unspecified body region, initial encounter: Secondary | ICD-10-CM

## 2012-02-09 LAB — BASIC METABOLIC PANEL
BUN: 8 mg/dL (ref 6–23)
CO2: 28 mEq/L (ref 19–32)
Calcium: 8.9 mg/dL (ref 8.4–10.5)
Chloride: 102 mEq/L (ref 96–112)
Creatinine, Ser: 0.63 mg/dL (ref 0.50–1.10)
GFR calc Af Amer: >90 mL/min (ref >90)
GFR calc non Af Amer: >90 mL/min (ref >90)
Glucose, Bld: 257 mg/dL — ABNORMAL HIGH (ref 70–99)
Potassium: 2.8 mEq/L — ABNORMAL LOW (ref 3.5–5.1)
Sodium: 137 mEq/L (ref 135–145)

## 2012-02-09 LAB — CBC
HCT: 45.4 % (ref 36.0–46.0)
Hemoglobin: 15.5 g/dL — ABNORMAL HIGH (ref 12.0–15.0)
MCH: 32.5 pg (ref 26.0–34.0)
MCHC: 34.1 g/dL (ref 30.0–36.0)
MCV: 95.2 fL (ref 78.0–100.0)
Platelets: 169 10*3/uL (ref 150–400)
RBC: 4.77 MIL/uL (ref 3.87–5.11)
RDW: 13.5 % (ref 11.5–15.5)
WBC: 7.2 10*3/uL (ref 4.0–10.5)

## 2012-02-09 MED ORDER — LABETALOL HCL 5 MG/ML IV SOLN
INTRAVENOUS | Status: AC
  Filled 2012-02-09: qty 8

## 2012-02-09 MED ORDER — POTASSIUM CHLORIDE CRYS ER 20 MEQ PO TBCR
60.0000 meq | EXTENDED_RELEASE_TABLET | Freq: Once | ORAL | Status: AC
  Administered 2012-02-09: 60 meq via ORAL
  Filled 2012-02-09: qty 3

## 2012-02-09 MED ORDER — LABETALOL HCL 5 MG/ML IV SOLN
40.0000 mg | Freq: Once | INTRAVENOUS | Status: AC
  Administered 2012-02-09: 40 mg via INTRAVENOUS
  Filled 2012-02-09: qty 8

## 2012-02-09 MED ORDER — LABETALOL HCL 100 MG PO TABS: 100.0000 mg | ORAL_TABLET | Freq: Two times a day (BID) | ORAL | Status: DC

## 2012-02-09 MED ORDER — HYDROMORPHONE HCL PF 1 MG/ML IJ SOLN
1.0000 mg | Freq: Once | INTRAMUSCULAR | Status: AC
  Administered 2012-02-09: 1 mg via INTRAVENOUS
  Filled 2012-02-09: qty 1

## 2012-02-09 MED ORDER — LABETALOL HCL 5 MG/ML IV SOLN
80.0000 mg | Freq: Once | INTRAVENOUS | Status: AC
  Administered 2012-02-09: 80 mg via INTRAVENOUS
  Filled 2012-02-09: qty 8

## 2012-02-09 MED ORDER — ONDANSETRON HCL 4 MG/2ML IJ SOLN
4.0000 mg | Freq: Once | INTRAMUSCULAR | Status: AC
  Administered 2012-02-09: 4 mg via INTRAVENOUS
  Filled 2012-02-09: qty 2

## 2012-02-09 MED ORDER — SODIUM CHLORIDE 0.9 % IV SOLN
Freq: Once | INTRAVENOUS | Status: AC
  Administered 2012-02-09: 16:00:00 via INTRAVENOUS

## 2012-02-09 MED ORDER — CLONIDINE HCL 0.2 MG PO TABS
0.2000 mg | ORAL_TABLET | Freq: Once | ORAL | Status: AC
  Administered 2012-02-09: 0.2 mg via ORAL
  Filled 2012-02-09: qty 1

## 2012-02-09 MED ORDER — HYDROCODONE-ACETAMINOPHEN 5-325 MG PO TABS: 1.0000 | ORAL_TABLET | ORAL | Status: DC | PRN

## 2012-02-09 MED ORDER — LABETALOL HCL 5 MG/ML IV SOLN
20.0000 mg | Freq: Once | INTRAVENOUS | Status: AC
  Administered 2012-02-09: 20 mg via INTRAVENOUS
  Filled 2012-02-09: qty 4

## 2012-02-09 MED ORDER — METOPROLOL TARTRATE 1 MG/ML IV SOLN
5.0000 mg | Freq: Once | INTRAVENOUS | Status: AC
  Administered 2012-02-09: 5 mg via INTRAVENOUS
  Filled 2012-02-09: qty 5

## 2012-02-09 NOTE — ED Notes (Signed)
Discharge instructions given and reviewed with patient.  Prescriptions given for Vicodin and Labetalol; effects and use explained.  Patient verbalized understanding to take medications as directed and to f/u with Dr. [name] for BP management.  Left heel/foot dressed with gauze and wrapped.  Patient ambulatory with steady gait; discharged home in good condition.  Significant other accompanied discharge.

## 2012-02-09 NOTE — Patient Instructions (Addendum)
F/u in 7 days.  You need to get to the  Ed for further management , your blood pressure is dangerously high.  Please bring all medication you are taking to every appointment

## 2012-02-09 NOTE — ED Notes (Signed)
Pt sent by Dr. [name] for elevated BP today at office visit. 210/140 per paperwork. Pt states, " Im in pain because my left ankle is broke. I had to take my cast off because it was rubbing a sore on the back of my ankle. Im moving as well." Dr. [name] sent over to have BP rechecked and addressed.

## 2012-02-09 NOTE — Discharge Instructions (Signed)
You were given several doses of medicines to gradually bring your blood pressure down. A prescription for a third blood pressure medicine has been sent to your pharmacy. Begin taking it tomorrow. Make a follow up appointment with Dr. [name] to have your blood pressure checked again next week. She may adjust your medicines. Continue the rest of your home medicines. Your potassium was low here tonight and you were given potassium to correct that.   For the sore you are developing on your heel, apply Mentholatum and cover with a bandaid. Pad the heel to prevent the rubbing by your brace. Look at the heel daily using a mirror to be sure you are not developing a larger sore or infection.

## 2012-02-09 NOTE — Progress Notes (Signed)
  Subjective:    Patient ID: [name], female    DOB: 02-Jan-1966, 46 y.o.   MRN: [id number]  HPI Pt in for f/u  With primary c/o left ankle pain which she broke several weeks ago. She has still not been to the endocrinologist, and is on no medication, sites, transportation issues as the problem.Alos has the chronic c/o poor finances, and uses this a sa part of the reason she has no medication. Resistant to offer for referral to [facility], states she wants to be responsible for her own money    Review of Systems See HPI Denies recent fever or chills. Denies sinus pressure, nasal congestion, ear pain or sore throat. Denies chest congestion, productive cough or wheezing. Denies chest pains, palpitations and leg swelling Denies abdominal pain, nausea, vomiting,diarrhea or constipation.   Denies dysuria, frequency, hesitancy or incontinence. Denies headaches, seizures, numbness, or tingling. Denies uncontrolled  depression, anxiety or insomnia. Denies skin break down or rash.        Objective:   Physical Exam Patient alert and oriented and in no cardiopulmonary distress.  HEENT: No facial asymmetry, EOMI, no sinus tenderness,  oropharynx pink and moist.  Neck supple no adenopathy.  Chest: Clear to auscultation bilaterally.decreased air entry bilaterally  CVS: S1, S2 no murmurs, no S3.  ABD: Soft non tender. Bowel sounds normal.  Ext: No edema  MS: decreased  ROM spine, shoulders, hips and knees.Decreased ROM left ankle  Skin: Intact, no ulcerations or rash noted.  Psych: Good eye contact, normal affect. Memory intact not anxious or depressed appearing.  CNS: CN 2-12 intact, power, tone and sensation normal throughout.        Assessment & Plan:

## 2012-02-09 NOTE — ED Notes (Signed)
Pt given peanut butter crackers and diet ginger ale 

## 2012-02-09 NOTE — ED Provider Notes (Signed)
History     CSN: [id number]  Arrival date & time 02/09/12  1430   First MD Initiated Contact with Patient 02/09/12 1450      Chief Complaint  Patient presents with  . Hypertension    210/140 per PCP office today.     (Consider location/radiation/quality/duration/timing/severity/associated sxs/prior treatment) HPI [name] is a 46 y.o. female  With a history of diabetes, obesity, hyperlipidemia, hypertension,who presents to the Emergency Department complaining of hypertension. She was seen at her primary care physician's office, Dr. [name], where her blood pressure was 210/140. Has a history of medical noncompliance however she states she has been taking her antihypertensives.she denies vision changes, headache, stiff neck, trouble speaking or swallowing, nausea, vomiting, shortness of breath, chest pain.  She is currently recovering from an ankle fracture sustained several weeks ago in combination with a Charcot foot. She has an area to her heel on the left foot but is slightly abraded from the [employer].   PCP  Dr. [name] Past Medical History  Diagnosis Date  . Alcohol use   . Diabetes mellitus, type 2   . Obesity   . Hyperlipidemia   . Hypertension     Past Surgical History  Procedure Date  . Dilation and curettage of uterus     Family History  Problem Relation Age of Onset  . Hypertension Mother   . Hypertension Father     CABG   . Hypertension Sister   . Heart attack Mother   . Arthritis    . Cancer    . Diabetes    . Asthma      History  Substance Use Topics  . Smoking status: Current Everyday Smoker -- 1.0 packs/day  . Smokeless tobacco: Not on file  . Alcohol Use: No    OB History    Grav Para Term Preterm Abortions TAB SAB Ect Mult Living                  Review of Systems  Constitutional: Negative for fever.       10 Systems reviewed and are negative for acute change except as noted in the HPI.  HENT: Negative for congestion.   Eyes:  Negative for discharge and redness.  Respiratory: Negative for cough and shortness of breath.   Cardiovascular: Negative for chest pain.  Gastrointestinal: Negative for vomiting and abdominal pain.  Musculoskeletal: Negative for back pain.       Left ankle with pain.  Skin: Negative for rash.  Neurological: Negative for syncope, numbness and headaches.  Psychiatric/Behavioral:       No behavior change.    Allergies  Review of patient's allergies indicates no known allergies.  Home Medications   Current Outpatient Rx  Name Route Sig Dispense Refill  . ALPRAZOLAM 0.5 MG PO TABS  TAKE 1 TABLET BY MOUTH 3 TIMES A DAY. 90 tablet 1  . BENAZEPRIL-HYDROCHLOROTHIAZIDE 20-12.5 MG PO TABS  Two tablets once daily at 6am 60 tablet 11  . VITAMIN D3 50000 UNITS PO CAPS Oral Take 50,000 Units by mouth once a week. 12 capsule 1  . CLONIDINE HCL 0.2 MG PO TABS  One tablet three times daily, at 6am , 2pm and 10pm 90 tablet 11  . NORCO 5-325 MG PO TABS  TAKE (1) TABLET BY MOUTH EVERY (4) HOURS AS NEEDED FOR PAIN. 42 each 0  . PRAVASTATIN SODIUM 40 MG PO TABS Oral Take 1 tablet (40 mg total) by mouth every evening. 30  tablet 11    BP 199/123  Pulse 96  Temp(Src) 98 F (36.7 C) (Oral)  Resp 18  Ht 5\' 1"  (1.549 m)  Wt 202 lb (91.627 kg)  BMI 38.17 kg/m2  SpO2 100%  LMP 02/01/2012  Physical Exam  Nursing note and vitals reviewed. Constitutional:       Awake, alert, nontoxic appearance with baseline speech for patient.  HENT:  Head: Atraumatic.  Mouth/Throat: No oropharyngeal exudate.  Eyes: EOM are normal. Pupils are equal, round, and reactive to light. Right eye exhibits no discharge. Left eye exhibits no discharge.  Neck: Neck supple.  Cardiovascular: Normal rate and regular rhythm.   No murmur heard. Pulmonary/Chest: Effort normal and breath sounds normal. No stridor. No respiratory distress. She has no wheezes. She has no rales. She exhibits no tenderness.  Abdominal: Soft. Bowel sounds  are normal. She exhibits no mass. There is no tenderness. There is no rebound.  Musculoskeletal: She exhibits no tenderness.       Baseline ROM, moves extremities with no obvious new focal weakness.left ankle with pain. Small abrasion to posterior heel with no evidence of infection or drainage.  Lymphadenopathy:    She has no cervical adenopathy.  Neurological:       Awake, alert, cooperative and aware of situation; motor strength bilaterally; sensation normal to light touch bilaterally; peripheral visual fields full to confrontation; no facial asymmetry; tongue midline; major cranial nerves appear intact; no pronator drift, normal finger to nose bilaterally, baseline gait without new ataxia.  Skin: No rash noted.  Psychiatric: She has a normal mood and affect.    ED Course  Procedures (including critical care time)  Results for orders placed during the hospital encounter of 02/09/12  CBC      Component Value Range   WBC 7.2  4.0 - 10.5 (K/uL)   RBC 4.77  3.87 - 5.11 (MIL/uL)   Hemoglobin 15.5 (*) 12.0 - 15.0 (g/dL)   HCT 45.4  09.8 - 11.9 (%)   MCV 95.2  78.0 - 100.0 (fL)   MCH 32.5  26.0 - 34.0 (pg)   MCHC 34.1  30.0 - 36.0 (g/dL)   RDW 14.7  82.9 - 56.2 (%)   Platelets 169  150 - 400 (K/uL)  BASIC METABOLIC PANEL      Component Value Range   Sodium 137  135 - 145 (mEq/L)   Potassium 2.8 (*) 3.5 - 5.1 (mEq/L)   Chloride 102  96 - 112 (mEq/L)   CO2 28  19 - 32 (mEq/L)   Glucose, Bld 257 (*) 70 - 99 (mg/dL)   BUN 8  6 - 23 (mg/dL)   Creatinine, Ser 1.30  0.50 - 1.10 (mg/dL)   Calcium 8.9  8.4 - 86.5 (mg/dL)   GFR calc non Af Amer >90  >90 (mL/min)   GFR calc Af Amer >90  >90 (mL/min)     MDM  Patient here with hypertension with no associated symptoms. Given metoprolol, clonidine, and labetalol x3. Improvement in blood pressure readings from 210/140 to 158/107.Patient does not want to be admitted to the hospital.Will add labetalol as an outpatient medication. She will be  following up with Dr. [name] next week. Labs with hypokalemia which was treated while in the ER.Pt stable in ED with no significant deterioration in condition.The patient appears reasonably screened and/or stabilized for discharge and I doubt any other medical condition or other [facility] requiring further screening, evaluation, or treatment in the ED at this time prior to  discharge.  MDM Reviewed: nursing note, vitals and previous chart Interpretation: labs Total time providing critical care: 40 minutes.           Nicoletta Dress. Colon Branch, MD 02/09/12 2154

## 2012-02-09 NOTE — ED Notes (Signed)
Pt moved to room 16 from room 2. Pt placed on cardiac monitor

## 2012-02-09 NOTE — ED Notes (Signed)
Pt called dietary herself and ask them to bring her a tray because she was hungry

## 2012-02-09 NOTE — ED Notes (Signed)
Pt req pain med for ankle pain and some one to call down to the kitchen to have her a tray sent up. States she has not had anything to eat all day

## 2012-02-09 NOTE — ED Notes (Signed)
Assumed care of patient.  Patient in room yelling for phone.

## 2012-02-09 NOTE — ED Notes (Signed)
Eating dinner, waiting for bp to come down

## 2012-02-11 ENCOUNTER — Telehealth: Payer: Self-pay | Admitting: Orthopedic Surgery

## 2012-02-11 ENCOUNTER — Other Ambulatory Visit: Payer: Self-pay | Admitting: Family Medicine

## 2012-02-11 NOTE — Telephone Encounter (Signed)
Radha Schafer's niece, Melene Plan needs you to call her at [phone].  [facility] needs some information  Regarding help in the home for [place].

## 2012-02-15 NOTE — Telephone Encounter (Signed)
Spoke with patients niece and advised all appropriate paperwork has already been completed

## 2012-02-16 ENCOUNTER — Ambulatory Visit (INDEPENDENT_AMBULATORY_CARE_PROVIDER_SITE_OTHER): Payer: Medicare Other

## 2012-02-16 ENCOUNTER — Encounter: Payer: Self-pay | Admitting: Orthopedic Surgery

## 2012-02-16 ENCOUNTER — Ambulatory Visit (INDEPENDENT_AMBULATORY_CARE_PROVIDER_SITE_OTHER): Payer: Medicare Other | Admitting: Orthopedic Surgery

## 2012-02-16 VITALS — Ht 61.0 in | Wt 209.0 lb

## 2012-02-16 DIAGNOSIS — S82899A Other fracture of unspecified lower leg, initial encounter for closed fracture: Secondary | ICD-10-CM

## 2012-02-16 DIAGNOSIS — S82892A Other fracture of left lower leg, initial encounter for closed fracture: Secondary | ICD-10-CM

## 2012-02-16 MED ORDER — HYDROCODONE-ACETAMINOPHEN 5-325 MG PO TABS: 1.0000 | ORAL_TABLET | ORAL | Status: DC | PRN

## 2012-02-16 MED ORDER — HYDROCODONE-ACETAMINOPHEN 5-325 MG PO TABS: 1.0000 | ORAL_TABLET | ORAL | Status: AC | PRN

## 2012-02-16 NOTE — Progress Notes (Signed)
Patient ID: [name], female   DOB: 12-May-1966, 46 y.o.   MRN: [id number] Chief Complaint  Patient presents with  . Follow-up    4 week recheck on left ankle with xray.    Date of injury February 7  The patient has an underlying Charcot foot with a lateral malleolus, fracture. She's been in a Cam Walker since her initial visit.  Her x-rays show that the fracture is healing nicely and the foot deformity persists. Of course, but she is making progress. Clinically, she still has tenderness over the fracture site, and we decided to keep the brace on another month.  Patient medication refill last 30 days

## 2012-02-16 NOTE — Telephone Encounter (Signed)
[name] spoke with patient's niece

## 2012-02-16 NOTE — Patient Instructions (Signed)
Continue wearing the ankle boot

## 2012-02-17 ENCOUNTER — Ambulatory Visit (INDEPENDENT_AMBULATORY_CARE_PROVIDER_SITE_OTHER): Payer: Medicare Other | Admitting: Family Medicine

## 2012-02-17 VITALS — BP 130/100 | HR 68 | Resp 18 | Ht 61.0 in | Wt 196.0 lb

## 2012-02-17 DIAGNOSIS — E119 Type 2 diabetes mellitus without complications: Secondary | ICD-10-CM

## 2012-02-17 DIAGNOSIS — F17200 Nicotine dependence, unspecified, uncomplicated: Secondary | ICD-10-CM

## 2012-02-17 DIAGNOSIS — E785 Hyperlipidemia, unspecified: Secondary | ICD-10-CM

## 2012-02-17 DIAGNOSIS — M549 Dorsalgia, unspecified: Secondary | ICD-10-CM

## 2012-02-17 DIAGNOSIS — I10 Essential (primary) hypertension: Secondary | ICD-10-CM

## 2012-02-17 MED ORDER — KETOROLAC TROMETHAMINE 60 MG/2ML IJ SOLN
60.0000 mg | Freq: Once | INTRAMUSCULAR | Status: AC
  Administered 2012-02-17: 60 mg via INTRAMUSCULAR

## 2012-02-17 NOTE — Patient Instructions (Addendum)
F/u in 2 month.  Please try to get the medication that you need espescially your blood pressure medication  Blood pressure is better today, I will be ordering more tests to try to get a better understanding as to why you have such marked changes in blood pressure.  Please stop smoking.  You need to make appt with Dr Fransico Him, blood sugar is too high

## 2012-02-17 NOTE — Progress Notes (Signed)
  Subjective:    Patient ID: [name], female    DOB: May 28, 1966, 46 y.o.   MRN: [id number]  HPI The PT is here for follow up from ED fo uncontrolled blood presure  and re-evaluation of chronic medical conditions, medication management and review of any available recent lab and radiology data.  Preventive health is updated, specifically  Cancer screening and Immunization.   States that she has had no medication since last here since meds have not been found in the [place] which brought her to the office. States she does not believe that she can get help with her med from any family member. Not interested in [facility] as far as assistance wit management of her chronic conditions and finances, worried about people trying to tell her what to do. Denies alcohol or illicit drug use    Review of Systems See HPI Denies recent fever or chills. Denies sinus pressure, nasal congestion, ear pain or sore throat. Denies chest congestion, productive cough or wheezing. Denies chest pains, palpitations and leg swelling Denies abdominal pain, nausea, vomiting,diarrhea or constipation.   Denies dysuria, frequency, hesitancy or incontinence.       Objective:   Physical Exam Patient alert and oriented and in no cardiopulmonary distress.  HEENT: No facial asymmetry, EOMI, no sinus tenderness,  oropharynx pink and moist.  Neck supple no adenopathy.  Chest: Clear to auscultation bilaterally.Decreased air entry throughout  CVS: S1, S2 no murmurs, no S3.  ABD: Soft non tender. Bowel sounds normal.  Ext: No edema  MS: Adequate ROM spine, shoulders, hips and knees.  Skin: Intact, no ulcerations or rash noted.  Psych: Good eye contact, normal affect. Memory intact not anxious or depressed appearing.  CNS: CN 2-12 intact, power, tone and sensation normal throughout.        Assessment & Plan:

## 2012-02-17 NOTE — Assessment & Plan Note (Signed)
Improved though pt states she has hadf no med since ED visit, denies illicit drug use, will need further eval, no money for med

## 2012-02-21 ENCOUNTER — Encounter: Payer: Self-pay | Admitting: Family Medicine

## 2012-02-21 NOTE — Assessment & Plan Note (Signed)
Uncontrolled, non compliant with management,needs to be treated by endo

## 2012-02-21 NOTE — Assessment & Plan Note (Signed)
Uncontrolled, non compliant with diet and medication

## 2012-02-21 NOTE — Assessment & Plan Note (Signed)
unchanged

## 2012-02-25 ENCOUNTER — Telehealth: Payer: Self-pay | Admitting: Orthopedic Surgery

## 2012-02-25 NOTE — Telephone Encounter (Signed)
Per phone with patient and per niece [name], who is helping to get her some personal home care services, states if we can re-fax original orders to the [facility], Services Department, personal care services at fax # [phone], ph# [phone], option 2.  Done.  *RE-Faxed to # [phone], Attn: Tia Masker, as toll-free fax not going through. Patient and niece aware of status.  * Niece also called back to say that [facility] will be working with the state services on this request, and that have already directly contacted someone at the state level to follow up on the request.

## 2012-02-28 DIAGNOSIS — Z9119 Patient's noncompliance with other medical treatment and regimen: Secondary | ICD-10-CM | POA: Insufficient documentation

## 2012-02-28 NOTE — Assessment & Plan Note (Signed)
unchnaged °

## 2012-02-28 NOTE — Assessment & Plan Note (Signed)
Uncontrolled , non compliant and appears to be in denial as to the severity of her illness

## 2012-02-28 NOTE — Assessment & Plan Note (Signed)
Non compliant with med blood pressure dangerously high sent to Ed for further management

## 2012-02-28 NOTE — Assessment & Plan Note (Signed)
A major problem, since despite the fact that she has severe HTN with renal disease and uncontrolled diabetes, she independently discontinued all med for approx 6 to 8 months and has not followed up with endo as she should. Currently resistant to [facility] involvement though she clearly needs help

## 2012-03-02 ENCOUNTER — Other Ambulatory Visit: Payer: Self-pay | Admitting: Family Medicine

## 2012-03-10 ENCOUNTER — Other Ambulatory Visit: Payer: Self-pay | Admitting: Family Medicine

## 2012-03-15 ENCOUNTER — Encounter (HOSPITAL_COMMUNITY): Payer: Self-pay

## 2012-03-15 ENCOUNTER — Emergency Department (HOSPITAL_COMMUNITY): Payer: PRIVATE HEALTH INSURANCE

## 2012-03-15 ENCOUNTER — Emergency Department (HOSPITAL_COMMUNITY)
Admission: EM | Admit: 2012-03-15 | Discharge: 2012-03-15 | Disposition: A | Discharge status: Home / Self Care | Payer: PRIVATE HEALTH INSURANCE | Attending: Emergency Medicine | Admitting: Emergency Medicine

## 2012-03-15 DIAGNOSIS — F17200 Nicotine dependence, unspecified, uncomplicated: Secondary | ICD-10-CM | POA: Insufficient documentation

## 2012-03-15 DIAGNOSIS — E785 Hyperlipidemia, unspecified: Secondary | ICD-10-CM | POA: Insufficient documentation

## 2012-03-15 DIAGNOSIS — M79609 Pain in unspecified limb: Secondary | ICD-10-CM | POA: Insufficient documentation

## 2012-03-15 DIAGNOSIS — S39012A Strain of muscle, fascia and tendon of lower back, initial encounter: Secondary | ICD-10-CM

## 2012-03-15 DIAGNOSIS — M545 Low back pain, unspecified: Secondary | ICD-10-CM | POA: Insufficient documentation

## 2012-03-15 DIAGNOSIS — I10 Essential (primary) hypertension: Secondary | ICD-10-CM | POA: Insufficient documentation

## 2012-03-15 DIAGNOSIS — E119 Type 2 diabetes mellitus without complications: Secondary | ICD-10-CM | POA: Insufficient documentation

## 2012-03-15 DIAGNOSIS — Y92009 Unspecified place in unspecified non-institutional (private) residence as the place of occurrence of the external cause: Secondary | ICD-10-CM | POA: Insufficient documentation

## 2012-03-15 DIAGNOSIS — S335XXA Sprain of ligaments of lumbar spine, initial encounter: Secondary | ICD-10-CM | POA: Insufficient documentation

## 2012-03-15 DIAGNOSIS — Z23 Encounter for immunization: Secondary | ICD-10-CM | POA: Insufficient documentation

## 2012-03-15 DIAGNOSIS — E7800 Pure hypercholesterolemia, unspecified: Secondary | ICD-10-CM | POA: Insufficient documentation

## 2012-03-15 DIAGNOSIS — W19XXXA Unspecified fall, initial encounter: Secondary | ICD-10-CM | POA: Insufficient documentation

## 2012-03-15 HISTORY — DX: Panic disorder (episodic paroxysmal anxiety): F41.0

## 2012-03-15 HISTORY — DX: Anxiety disorder, unspecified: F41.9

## 2012-03-15 LAB — GLUCOSE, CAPILLARY: Glucose-Capillary: 231 mg/dL — ABNORMAL HIGH (ref 70–99)

## 2012-03-15 MED ORDER — TETANUS-DIPHTH-ACELL PERTUSSIS 5-2.5-18.5 LF-MCG/0.5 IM SUSP
0.5000 mL | Freq: Once | INTRAMUSCULAR | Status: AC
  Administered 2012-03-15: 0.5 mL via INTRAMUSCULAR
  Filled 2012-03-15: qty 0.5

## 2012-03-15 MED ORDER — OXYCODONE-ACETAMINOPHEN 5-325 MG PO TABS
2.0000 | ORAL_TABLET | Freq: Once | ORAL | Status: AC
  Administered 2012-03-15: 2 via ORAL
  Filled 2012-03-15: qty 2

## 2012-03-15 MED ORDER — OXYCODONE-ACETAMINOPHEN 5-325 MG PO TABS: 1.0000 | ORAL_TABLET | ORAL | Status: AC | PRN

## 2012-03-15 NOTE — ED Provider Notes (Signed)
History     CSN: [id number]  Arrival date & time 03/15/12  1244   First MD Initiated Contact with Patient 03/15/12 1310      Chief Complaint  Patient presents with  . Fall     Patient is a 46 y.o. female presenting with fall. The history is provided by the patient.  Fall The accident occurred 2 days ago. The fall occurred while walking. Pain location: left LE. The pain is moderate. She was ambulatory at the scene. Pertinent negatives include no abdominal pain, no headaches and no loss of consciousness. The symptoms are aggravated by activity. She has tried rest for the symptoms. The treatment provided no relief.  Pt reports she fell two days ago while walking dog.   No head injury, no headache, no LOC No cp/sob No abdominal pain Did not fall head over heels, landed on back and twisted her left ankle.  She also fell onto left hand She reports she is in a "walking boot" from recent ankle fracture and thinks she may have reinjured the leg.  Past Medical History  Diagnosis Date  . Alcohol use   . Diabetes mellitus, type 2   . Obesity   . Hyperlipidemia   . Hypertension   . Anxiety   . Panic attacks   . Hypercholesteremia     Past Surgical History  Procedure Date  . Dilation and curettage of uterus     Family History  Problem Relation Age of Onset  . Hypertension Mother   . Hypertension Father     CABG   . Hypertension Sister   . Heart attack Mother   . Arthritis    . Cancer    . Diabetes    . Asthma      History  Substance Use Topics  . Smoking status: Current Everyday Smoker -- 1.0 packs/day    Types: Cigarettes  . Smokeless tobacco: Not on file  . Alcohol Use: No     stopped jan 2012    OB History    Grav Para Term Preterm Abortions TAB SAB Ect Mult Living                  Review of Systems  Gastrointestinal: Negative for abdominal pain.  Neurological: Negative for loss of consciousness and headaches.    Allergies  Review of patient's allergies  indicates no known allergies.  Home Medications   Current Outpatient Rx  Name Route Sig Dispense Refill  . ALPRAZOLAM 0.5 MG PO TABS  TAKE 1 TABLET BY MOUTH 3 TIMES A DAY. 90 tablet 0  . BENAZEPRIL-HYDROCHLOROTHIAZIDE 20-12.5 MG PO TABS  Two tablets once daily at 6am 60 tablet 11  . VITAMIN D3 50000 UNITS PO CAPS Oral Take 50,000 Units by mouth once a week. 12 capsule 1  . CLONIDINE HCL 0.2 MG PO TABS  One tablet three times daily, at 6am , 2pm and 10pm 90 tablet 11  . FLEXERIL 10 MG PO TABS  TAKE 1 TABLET BY MOUTH DAILY AT BEDTIME AS NEEDED FOR MUSCLE SPASMS. 30 each 3  . HYDROCODONE-ACETAMINOPHEN 5-325 MG PO TABS Oral Take 1 tablet by mouth every 4 (four) hours as needed for pain. 60 tablet 2  . LABETALOL HCL 100 MG PO TABS Oral Take 1 tablet (100 mg total) by mouth 2 (two) times daily. 60 tablet 1  . MOTRIN 800 MG PO TABS  TAKE 1 TABLET BY MOUTH ONCE A DAY AS NEEDED. 30 each 2  . PRAVASTATIN  SODIUM 40 MG PO TABS Oral Take 1 tablet (40 mg total) by mouth every evening. 30 tablet 11  . ULTRAM 50 MG PO TABS  TAKE (1) TABLET BY MOUTH TWICE A DAY AS NEEDED. 30 each 3    BP 186/124  Pulse 103  Temp(Src) 98.4 F (36.9 C) (Oral)  Resp 20  Ht 5\' 2"  (1.575 m)  Wt 200 lb (90.719 kg)  BMI 36.58 kg/m2  SpO2 99%  LMP 03/15/2012  Physical Exam CONSTITUTIONAL: Well developed/well nourished HEAD AND FACE: Normocephalic/atraumatic EYES: EOMI/PERRL ENMT: Mucous membranes moist NECK: supple no meningeal signs SPINE:cervical/thoracic spine nontender, lumbar tenderness, no crepitance CV: S1/S2 noted, no murmurs/rubs/gallops noted LUNGS: Lungs are clear to auscultation bilaterally, no apparent distress ABDOMEN: soft, nontender, no rebound or guarding GU:no cva tenderness NEURO: Pt is awake/alert, moves all extremitiesx4, no focal motor deficits EXTREMITIES: pulses normal, full ROM.  Pt has tenderness to left ankle and also tibial surface.  There is a small abrasion to left tibia.  Tenderness to  dorsal aspect of left hand.  All other extremities/joints palpated/ranged and nontender SKIN: warm, color normal PSYCH: no abnormalities of mood noted  ED Course  Procedures   Labs Reviewed  GLUCOSE, CAPILLARY - Abnormal; Notable for the following:    Glucose-Capillary 231 (*)    All other components within normal limits   Dg Lumbar Spine Complete  03/15/2012  *RADIOLOGY REPORT*  Clinical Data: Low back pain status post fall today.  LUMBAR SPINE - COMPLETE 4+ VIEW  Comparison: Lumbar spine radiographs 12/20/2006.  Findings: There are five lumbar type vertebral bodies.  The alignment is stable with a grade 1 anterolisthesis at L3-L4 and L4- L5.  Vacuum phenomenon within those discs appears progressive. There is no evidence of acute fracture or traumatic subluxation. Lower lumbar spine facet degenerative changes are noted.  IMPRESSION: Progressive lower lumbar spine disc degeneration with stable alignment.  No acute osseous findings.  Original Report Authenticated By: Gerrianne Scale, M.D.   Dg Tibia/fibula Left  03/15/2012  *RADIOLOGY REPORT*  Clinical Data: Fall today.  Anterior leg bruising.  LEFT TIBIA AND FIBULA - 2 VIEW  Comparison: Ankle radiographs 12/20/2011 and 02/16/2012.  Findings: There is soft tissue swelling anterior to the mid tibia. There is post-traumatic deformity of the distal fibula consistent with a healing fracture.  No acute fracture or dislocation is seen. Talar neck deformity appears stable.  IMPRESSION: Pretibial soft tissue swelling with healing fracture of the distal fibula.  No acute osseous findings.  Original Report Authenticated By: Gerrianne Scale, M.D.    2:54 PM No acute injury Stable for d/c    MDM  Nursing notes reviewed and considered in documentation Previous records reviewed and considered xrays reviewed and considered Narcotic database reviewed        Joya Gaskins, MD 03/15/12 1454

## 2012-03-15 NOTE — Discharge Instructions (Signed)
SEEK IMMEDIATE MEDICAL ATTENTION IF: °New numbness, tingling, weakness, or problem with the use of your arms or legs.  °Severe back pain not relieved with medications.  °Change in bowel or bladder control (if you lose control of stool or urine, or if you are unable to urinate) °Increasing pain in any areas of the body (such as chest or abdominal pain).  °Shortness of breath, dizziness or fainting.  °Nausea (feeling sick to your stomach), vomiting, fever, or sweats. ° °

## 2012-03-15 NOTE — ED Notes (Signed)
Pt fell on Sunday, injured left small toe, back pain, 5th finger in left hand.  Broke left foot 2 weeks ago and in boot.  Was on telephone when called to triage.

## 2012-03-16 ENCOUNTER — Ambulatory Visit (INDEPENDENT_AMBULATORY_CARE_PROVIDER_SITE_OTHER): Payer: PRIVATE HEALTH INSURANCE | Admitting: Orthopedic Surgery

## 2012-03-16 ENCOUNTER — Ambulatory Visit (INDEPENDENT_AMBULATORY_CARE_PROVIDER_SITE_OTHER): Payer: Medicaid Other

## 2012-03-16 ENCOUNTER — Encounter: Payer: Self-pay | Admitting: Orthopedic Surgery

## 2012-03-16 VITALS — BP 160/100 | Ht 62.0 in | Wt 200.0 lb

## 2012-03-16 DIAGNOSIS — S82899A Other fracture of unspecified lower leg, initial encounter for closed fracture: Secondary | ICD-10-CM

## 2012-03-16 DIAGNOSIS — M79673 Pain in unspecified foot: Secondary | ICD-10-CM

## 2012-03-16 DIAGNOSIS — M79609 Pain in unspecified limb: Secondary | ICD-10-CM

## 2012-03-16 DIAGNOSIS — S82892A Other fracture of left lower leg, initial encounter for closed fracture: Secondary | ICD-10-CM

## 2012-03-16 MED ORDER — HYDROCODONE-ACETAMINOPHEN 5-325 MG PO TABS: 1.0000 | ORAL_TABLET | ORAL | Status: DC | PRN

## 2012-03-16 NOTE — Patient Instructions (Addendum)
Call [facility] to arrange appointment to be fit for AFO  Remove brace   Consult Dr Lestine Box: Charcot Foot

## 2012-03-16 NOTE — Progress Notes (Signed)
Patient ID: [name], female   DOB: 05-12-1966, 46 y.o.   MRN: [id number] Chief Complaint  Patient presents with  . Follow-up    one month recheck and xray left ankle    BP 160/100  Ht 5\' 2"  (1.575 m)  Wt 200 lb (90.719 kg)  BMI 36.58 kg/m2  LMP 03/15/2012  Followup for x-ray, status post ankle fracture with underlying Charcot foot.  Recommend AFO brace and consult with foot specialist.  X-ray show fracture healing.

## 2012-03-17 ENCOUNTER — Encounter: Payer: Self-pay | Admitting: *Deleted

## 2012-03-17 NOTE — Progress Notes (Signed)
Patient ID: [name], female   DOB: 1966-02-23, 46 y.o.   MRN: [id number] A referral has been sent to [facility] for Dr Lestine Box Faxed 03/17/12 Adella Hare, LPN

## 2012-03-24 ENCOUNTER — Telehealth: Payer: Self-pay | Admitting: *Deleted

## 2012-03-24 NOTE — Telephone Encounter (Signed)
appt

## 2012-03-29 ENCOUNTER — Telehealth: Payer: Self-pay | Admitting: Family Medicine

## 2012-03-29 ENCOUNTER — Telehealth: Payer: Self-pay | Admitting: Orthopedic Surgery

## 2012-03-29 NOTE — Telephone Encounter (Signed)
Patient has contacted and has scheduled an appointment at [facility] for custom brace; per Judeth Cornfield, scheduled for 03/31/12, 2:00pm

## 2012-03-30 NOTE — Telephone Encounter (Signed)
Noted  

## 2012-03-30 NOTE — Telephone Encounter (Signed)
Has not been getting from here, pls check I believe ortho, she needs to call the prescriber

## 2012-04-08 ENCOUNTER — Other Ambulatory Visit: Payer: Self-pay | Admitting: Family Medicine

## 2012-04-14 ENCOUNTER — Telehealth: Payer: Self-pay | Admitting: Family Medicine

## 2012-04-14 ENCOUNTER — Other Ambulatory Visit: Payer: Self-pay | Admitting: *Deleted

## 2012-04-14 DIAGNOSIS — M79673 Pain in unspecified foot: Secondary | ICD-10-CM

## 2012-04-14 DIAGNOSIS — S82892A Other fracture of left lower leg, initial encounter for closed fracture: Secondary | ICD-10-CM

## 2012-04-14 DIAGNOSIS — S82899A Other fracture of unspecified lower leg, initial encounter for closed fracture: Secondary | ICD-10-CM

## 2012-04-14 MED ORDER — HYDROCODONE-ACETAMINOPHEN 5-325 MG PO TABS: 1.0000 | ORAL_TABLET | ORAL | Status: AC | PRN

## 2012-04-14 NOTE — Telephone Encounter (Signed)
Patient aware she was just given a refill of pain meds from Dr Diamantina Providence office today

## 2012-04-18 ENCOUNTER — Encounter: Payer: Self-pay | Admitting: Family Medicine

## 2012-04-18 ENCOUNTER — Telehealth: Payer: Self-pay | Admitting: Family Medicine

## 2012-04-18 ENCOUNTER — Ambulatory Visit (HOSPITAL_COMMUNITY)
Admission: RE | Admit: 2012-04-18 | Discharge: 2012-04-18 | Disposition: A | Discharge status: Home / Self Care | Payer: PRIVATE HEALTH INSURANCE | Source: Ambulatory Visit | Attending: Family Medicine | Admitting: Family Medicine

## 2012-04-18 ENCOUNTER — Ambulatory Visit (INDEPENDENT_AMBULATORY_CARE_PROVIDER_SITE_OTHER): Payer: PRIVATE HEALTH INSURANCE | Admitting: Family Medicine

## 2012-04-18 VITALS — BP 180/120 | HR 80 | Resp 16 | Ht 62.0 in | Wt 198.0 lb

## 2012-04-18 DIAGNOSIS — M79609 Pain in unspecified limb: Secondary | ICD-10-CM | POA: Insufficient documentation

## 2012-04-18 DIAGNOSIS — E119 Type 2 diabetes mellitus without complications: Secondary | ICD-10-CM

## 2012-04-18 DIAGNOSIS — L97909 Non-pressure chronic ulcer of unspecified part of unspecified lower leg with unspecified severity: Secondary | ICD-10-CM

## 2012-04-18 DIAGNOSIS — M7989 Other specified soft tissue disorders: Secondary | ICD-10-CM | POA: Insufficient documentation

## 2012-04-18 DIAGNOSIS — Z9119 Patient's noncompliance with other medical treatment and regimen: Secondary | ICD-10-CM

## 2012-04-18 DIAGNOSIS — L97929 Non-pressure chronic ulcer of unspecified part of left lower leg with unspecified severity: Secondary | ICD-10-CM

## 2012-04-18 DIAGNOSIS — L97809 Non-pressure chronic ulcer of other part of unspecified lower leg with unspecified severity: Secondary | ICD-10-CM | POA: Insufficient documentation

## 2012-04-18 DIAGNOSIS — F17200 Nicotine dependence, unspecified, uncomplicated: Secondary | ICD-10-CM

## 2012-04-18 DIAGNOSIS — I10 Essential (primary) hypertension: Secondary | ICD-10-CM

## 2012-04-18 MED ORDER — KETOROLAC TROMETHAMINE 60 MG/2ML IJ SOLN
60.0000 mg | Freq: Once | INTRAMUSCULAR | Status: AC
  Administered 2012-04-18: 60 mg via INTRAMUSCULAR

## 2012-04-18 MED ORDER — CEFTRIAXONE SODIUM 1 G IJ SOLR
500.0000 mg | Freq: Once | INTRAMUSCULAR | Status: AC
  Administered 2012-04-18: 500 mg via INTRAMUSCULAR

## 2012-04-18 MED ORDER — LABETALOL HCL 100 MG PO TABS: 100.0000 mg | ORAL_TABLET | Freq: Two times a day (BID) | ORAL | Status: DC

## 2012-04-18 MED ORDER — LEVOFLOXACIN 500 MG PO TABS: 500.0000 mg | ORAL_TABLET | Freq: Every day | ORAL | Status: AC

## 2012-04-18 MED ORDER — AMOXICILLIN-POT CLAVULANATE 875-125 MG PO TABS: 1.0000 | ORAL_TABLET | Freq: Two times a day (BID) | ORAL | Status: AC

## 2012-04-18 NOTE — Patient Instructions (Addendum)
F/u in 3 weeks  You need to go to the wound  center as soon as possible, we will call for an appt this weeek  CBC and diff chem 7 , hba1c today  X ray of left leg today, order is sent in  Toradol 60mg  Im for pain and rocephin 500mg  im in office, blood sugar to be checked   Antibiotics are sent in for 2 weeks pls take as directed.  [facility] will be contacted again since you report that they "passed  By " your home the day the came out  Please start taking all your regular medication , we will request they be delivered.Your blood pressure remains too high

## 2012-04-19 ENCOUNTER — Telehealth: Payer: Self-pay

## 2012-04-19 LAB — HEMOGLOBIN A1C
Hgb A1c MFr Bld: 7.9 % — ABNORMAL HIGH (ref <5.7)
Mean Plasma Glucose: 180 mg/dL — ABNORMAL HIGH (ref <117)

## 2012-04-19 LAB — CBC WITH DIFFERENTIAL/PLATELET
Basophils Absolute: 0 10*3/uL (ref 0.0–0.1)
Basophils Relative: 1 % (ref 0–1)
Eosinophils Absolute: 0.1 10*3/uL (ref 0.0–0.7)
Eosinophils Relative: 2 % (ref 0–5)
HCT: 46.6 % — ABNORMAL HIGH (ref 36.0–46.0)
Hemoglobin: 16.1 g/dL — ABNORMAL HIGH (ref 12.0–15.0)
Lymphocytes Relative: 21 % (ref 12–46)
Lymphs Abs: 1.8 10*3/uL (ref 0.7–4.0)
MCH: 32.9 pg (ref 26.0–34.0)
MCHC: 34.5 g/dL (ref 30.0–36.0)
MCV: 95.3 fL (ref 78.0–100.0)
Monocytes Absolute: 0.4 10*3/uL (ref 0.1–1.0)
Monocytes Relative: 5 % (ref 3–12)
Neutro Abs: 6.3 10*3/uL (ref 1.7–7.7)
Neutrophils Relative %: 71 % (ref 43–77)
Platelets: 261 10*3/uL (ref 150–400)
RBC: 4.89 MIL/uL (ref 3.87–5.11)
RDW: 13.4 % (ref 11.5–15.5)
WBC: 8.7 10*3/uL (ref 4.0–10.5)

## 2012-04-19 LAB — BASIC METABOLIC PANEL
BUN: 14 mg/dL (ref 6–23)
CO2: 24 mEq/L (ref 19–32)
Calcium: 9.5 mg/dL (ref 8.4–10.5)
Chloride: 105 mEq/L (ref 96–112)
Creat: 1.04 mg/dL (ref 0.50–1.10)
Glucose, Bld: 185 mg/dL — ABNORMAL HIGH (ref 70–99)
Potassium: 4.4 mEq/L (ref 3.5–5.3)
Sodium: 141 mEq/L (ref 135–145)

## 2012-04-19 NOTE — Telephone Encounter (Signed)
Pt stated that she cannot afford her medicine until the first of the month and that she is out of pain medicine.  Reminded pt that she received tordal inj. Yesterday in the office.  Please advise.

## 2012-04-19 NOTE — Telephone Encounter (Signed)
I am not responsible for prescribing her pain medicine so there is nothing else I have to offer

## 2012-04-20 NOTE — Telephone Encounter (Signed)
Pt aware.

## 2012-04-24 NOTE — Progress Notes (Signed)
  Subjective:    Patient ID: [name], female    DOB: Apr 02, 1966, 46 y.o.   MRN: [id number]  HPI 1 month h/o trauma to leg with ulcer which has progressively enlarged, now with malodorous drainage. Pt has been on no antibiotics for this to th present time and is not getting specific care for an ulcer. She is an uncontrolled diabetic. She remains non compliant with medical management. She denies fever or chills, still smokes and is unwilling to set a quit date. Cites lack of transport and money as major issues. Not currently testing blood sugars regularly, and denies polyuria or polydipsia   Review of Systems See HPI Denies recent fever or chills. Denies sinus pressure, nasal congestion, ear pain or sore throat. Denies chest congestion, productive cough or wheezing. Denies chest pains, palpitations and leg swelling Denies abdominal pain, nausea, vomiting,diarrhea or constipation.   Denies dysuria, frequency, hesitancy or incontinence.  Denies headaches, seizures, numbness, or tingling. Denies depression, anxiety or insomnia.         Objective:   Physical Exam Patient alert and oriented and in no cardiopulmonary distress.  HEENT: No facial asymmetry, EOMI, no sinus tenderness,  oropharynx pink and moist.  Neck supple no adenopathy.  Chest: Clear to auscultation bilaterally.Decrreased air entry throughout  CVS: S1, S2 no murmurs, no S3.  ABD: Soft non tender. Bowel sounds normal.  Ext: No edema  MS: Adequate ROM spine, shoulders, hips and knees.  Skin: Ulcer with erythematous border and foul smelling drainage on anterior surface of leg, longest diameter approx 5.5 inches  Psych: Good eye contact, normal affect. Memory intact not anxious or depressed appearing.  CNS: CN 2-12 intact, power, tone and sensation normal throughout.       Assessment & Plan:

## 2012-04-24 NOTE — Assessment & Plan Note (Signed)
An ongoing issue, the only medication currently reports taking is "pain med" and always requesting this, although I am not responsible for the script

## 2012-04-24 NOTE — Assessment & Plan Note (Signed)
Improved though uncontrolled, needs to re establish with endocrine as well as to comply with treatment paln

## 2012-04-24 NOTE — Assessment & Plan Note (Signed)
Unchanged , cessation counseling done 

## 2012-04-24 NOTE — Assessment & Plan Note (Signed)
Malodorous leg ulcer obviously infected, wound cleaned with saline and gauze dressing applied, rocephin administered in office, antibiotics prescribed and pt referred to the wound center

## 2012-04-24 NOTE — Assessment & Plan Note (Signed)
Uncontrolled, non compliant, risk of stroke and heart disease stressed

## 2012-05-02 ENCOUNTER — Other Ambulatory Visit: Payer: Self-pay | Admitting: Family Medicine

## 2012-05-09 ENCOUNTER — Ambulatory Visit: Payer: PRIVATE HEALTH INSURANCE | Admitting: Family Medicine

## 2012-05-10 ENCOUNTER — Other Ambulatory Visit: Payer: Self-pay | Admitting: Family Medicine

## 2012-05-10 ENCOUNTER — Ambulatory Visit (INDEPENDENT_AMBULATORY_CARE_PROVIDER_SITE_OTHER): Payer: PRIVATE HEALTH INSURANCE | Admitting: Family Medicine

## 2012-05-10 ENCOUNTER — Encounter: Payer: Self-pay | Admitting: Family Medicine

## 2012-05-10 VITALS — BP 160/112 | HR 89 | Resp 16 | Ht 62.0 in | Wt 204.0 lb

## 2012-05-10 DIAGNOSIS — E669 Obesity, unspecified: Secondary | ICD-10-CM

## 2012-05-10 DIAGNOSIS — E785 Hyperlipidemia, unspecified: Secondary | ICD-10-CM

## 2012-05-10 DIAGNOSIS — M25519 Pain in unspecified shoulder: Secondary | ICD-10-CM

## 2012-05-10 DIAGNOSIS — M25511 Pain in right shoulder: Secondary | ICD-10-CM

## 2012-05-10 DIAGNOSIS — R69 Illness, unspecified: Secondary | ICD-10-CM

## 2012-05-10 DIAGNOSIS — Z139 Encounter for screening, unspecified: Secondary | ICD-10-CM

## 2012-05-10 DIAGNOSIS — M25572 Pain in left ankle and joints of left foot: Secondary | ICD-10-CM

## 2012-05-10 DIAGNOSIS — I10 Essential (primary) hypertension: Secondary | ICD-10-CM

## 2012-05-10 DIAGNOSIS — F17200 Nicotine dependence, unspecified, uncomplicated: Secondary | ICD-10-CM

## 2012-05-10 DIAGNOSIS — M25579 Pain in unspecified ankle and joints of unspecified foot: Secondary | ICD-10-CM

## 2012-05-10 DIAGNOSIS — E119 Type 2 diabetes mellitus without complications: Secondary | ICD-10-CM

## 2012-05-10 DIAGNOSIS — S82892A Other fracture of left lower leg, initial encounter for closed fracture: Secondary | ICD-10-CM

## 2012-05-10 DIAGNOSIS — Z7409 Other reduced mobility: Secondary | ICD-10-CM

## 2012-05-10 MED ORDER — AMLODIPINE BESYLATE 5 MG PO TABS: 5.0000 mg | ORAL_TABLET | Freq: Every day | ORAL | Status: DC

## 2012-05-10 MED ORDER — CLONIDINE HCL 0.3 MG PO TABS: ORAL_TABLET | ORAL | Status: DC

## 2012-05-10 MED ORDER — KETOROLAC TROMETHAMINE 60 MG/2ML IM SOLN
60.0000 mg | Freq: Once | INTRAMUSCULAR | Status: AC
  Administered 2012-05-10: 60 mg via INTRAMUSCULAR

## 2012-05-10 NOTE — Patient Instructions (Addendum)
Pelvic and breast exam in  3rd or 4th week in August   Dose increase on clonidine and additional blood pressure medication to be started   You will be referred for an MRI of the right shoulder  Fasting lipid and cmp  In July when getting Dr. Isidoro Donning labs  Toradol today for pain in left ankle  Mammogram to be scheduled at exit

## 2012-05-10 NOTE — Assessment & Plan Note (Addendum)
Increased with reduced mobility, order MRI, toradol administered at visit

## 2012-05-10 NOTE — Assessment & Plan Note (Signed)
Requesting power wheelchair , will refer to physical therapy for further eval, she does have ankle pathology, left, left leg ulcer and is now c/o right shoulder debility

## 2012-05-10 NOTE — Assessment & Plan Note (Signed)
Unchanged , cessation counseling done 

## 2012-05-10 NOTE — Assessment & Plan Note (Signed)
Uncontrolled pain, meds from ortho, toradol in office today

## 2012-05-10 NOTE — Assessment & Plan Note (Signed)
Hyperlipidemia:Low fat diet discussed and encouraged.  Uncontrolled currently

## 2012-05-10 NOTE — Assessment & Plan Note (Signed)
Uncontrolled , being followed by endo 

## 2012-05-10 NOTE — Assessment & Plan Note (Signed)
Deteriorated. Patient re-educated about  the importance of commitment to a  minimum of 150 minutes of exercise per week. The importance of healthy food choices with portion control discussed. Encouraged to start a food diary, count calories and to consider  joining a support group. Sample diet sheets offered. Goals set by the patient for the next several months.    

## 2012-05-10 NOTE — Assessment & Plan Note (Signed)
Improved, but not at goal, increased dose and additional med

## 2012-05-10 NOTE — Progress Notes (Signed)
  Subjective:    Patient ID: [name], female    DOB: May 08, 1966, 46 y.o.   MRN: [id number]  HPI Pt in for f/u of uncontrolled blood pressure. Her main c/o uncontrolled left leg and ankle pain which are being treated by ortho and the wound center. I have made it clear again that ortho will continue to be responsible for her pain meds. C/o reduced mobility and pain in right shoulder, pain and reduced mobility in left ankle with limitation in safe mobility requests power wheelchair, will have PT assess.    Review of Systems See HPI Denies recent fever or chills. Denies sinus pressure, nasal congestion, ear pain or sore throat. Denies chest congestion, productive cough or wheezing. Denies chest pains, palpitations and leg swelling Denies abdominal pain, nausea, vomiting,diarrhea or constipation.   Denies dysuria, frequency, hesitancy or incontinence. Denies headaches, seizures, numbness, or tingling. Denies uncontrolled  depression, anxiety or insomnia. Denies skin break down or rash.        Objective:   Physical Exam  Patient alert and oriented and in no cardiopulmonary distress.  HEENT: No facial asymmetry, EOMI, no sinus tenderness,  oropharynx pink and moist.  Neck supple no adenopathy.  Chest: Clear to auscultation bilaterally.  CVS: S1, S2 no murmurs, no S3.  ABD: Soft non tender. Bowel sounds normal.  Ext: No edema  MS: decreased  ROM  Right  shoulders, and left  ankle  Skin: Intact, no ulcerations or rash noted.  Psych: Good eye contact, normal affect. Memory intact not anxious or depressed appearing.  CNS: CN 2-12 intact, power normal throughout.       Assessment & Plan:

## 2012-05-19 ENCOUNTER — Ambulatory Visit (HOSPITAL_COMMUNITY)
Admission: RE | Admit: 2012-05-19 | Discharge: 2012-05-19 | Disposition: A | Discharge status: Home / Self Care | Payer: PRIVATE HEALTH INSURANCE | Source: Ambulatory Visit | Attending: Family Medicine | Admitting: Family Medicine

## 2012-05-19 DIAGNOSIS — Z139 Encounter for screening, unspecified: Secondary | ICD-10-CM

## 2012-05-19 DIAGNOSIS — Z1231 Encounter for screening mammogram for malignant neoplasm of breast: Secondary | ICD-10-CM | POA: Insufficient documentation

## 2012-05-25 ENCOUNTER — Other Ambulatory Visit: Payer: Self-pay | Admitting: Family Medicine

## 2012-05-25 DIAGNOSIS — R928 Other abnormal and inconclusive findings on diagnostic imaging of breast: Secondary | ICD-10-CM

## 2012-05-26 NOTE — Telephone Encounter (Signed)
Patient is aware 

## 2012-05-31 ENCOUNTER — Emergency Department (HOSPITAL_COMMUNITY)
Admission: EM | Admit: 2012-05-31 | Discharge: 2012-05-31 | Disposition: A | Discharge status: Home / Self Care | Payer: PRIVATE HEALTH INSURANCE | Attending: Emergency Medicine | Admitting: Emergency Medicine

## 2012-05-31 ENCOUNTER — Encounter (HOSPITAL_COMMUNITY): Payer: Self-pay | Admitting: *Deleted

## 2012-05-31 ENCOUNTER — Emergency Department (HOSPITAL_COMMUNITY): Payer: PRIVATE HEALTH INSURANCE

## 2012-05-31 ENCOUNTER — Telehealth: Payer: Self-pay | Admitting: Family Medicine

## 2012-05-31 DIAGNOSIS — E119 Type 2 diabetes mellitus without complications: Secondary | ICD-10-CM | POA: Insufficient documentation

## 2012-05-31 DIAGNOSIS — I10 Essential (primary) hypertension: Secondary | ICD-10-CM | POA: Insufficient documentation

## 2012-05-31 DIAGNOSIS — Z79899 Other long term (current) drug therapy: Secondary | ICD-10-CM | POA: Insufficient documentation

## 2012-05-31 DIAGNOSIS — G939 Disorder of brain, unspecified: Secondary | ICD-10-CM | POA: Insufficient documentation

## 2012-05-31 LAB — CBC WITH DIFFERENTIAL/PLATELET
Basophils Absolute: 0.1 10*3/uL (ref 0.0–0.1)
Basophils Relative: 1 % (ref 0–1)
Eosinophils Absolute: 0.2 10*3/uL (ref 0.0–0.7)
Eosinophils Relative: 2 % (ref 0–5)
HCT: 45.2 % (ref 36.0–46.0)
Hemoglobin: 16.1 g/dL — ABNORMAL HIGH (ref 12.0–15.0)
Lymphocytes Relative: 30 % (ref 12–46)
Lymphs Abs: 2.4 10*3/uL (ref 0.7–4.0)
MCH: 33.6 pg (ref 26.0–34.0)
MCHC: 35.6 g/dL (ref 30.0–36.0)
MCV: 94.4 fL (ref 78.0–100.0)
Monocytes Absolute: 0.3 10*3/uL (ref 0.1–1.0)
Monocytes Relative: 4 % (ref 3–12)
Neutro Abs: 4.9 10*3/uL (ref 1.7–7.7)
Neutrophils Relative %: 62 % (ref 43–77)
Platelets: 210 10*3/uL (ref 150–400)
RBC: 4.79 MIL/uL (ref 3.87–5.11)
RDW: 13.1 % (ref 11.5–15.5)
WBC: 7.8 10*3/uL (ref 4.0–10.5)

## 2012-05-31 LAB — URINALYSIS, ROUTINE W REFLEX MICROSCOPIC
Glucose, UA: NEGATIVE mg/dL
Ketones, ur: NEGATIVE mg/dL
Leukocytes, UA: NEGATIVE
Nitrite: NEGATIVE
Protein, ur: >300 mg/dL — AB
Specific Gravity, Urine: >1.03 — ABNORMAL HIGH (ref 1.005–1.030)
Urobilinogen, UA: 0.2 mg/dL (ref 0.0–1.0)
pH: 6 (ref 5.0–8.0)

## 2012-05-31 LAB — COMPREHENSIVE METABOLIC PANEL
ALT: 9 U/L (ref 0–35)
AST: 12 U/L (ref 0–37)
Albumin: 3.2 g/dL — ABNORMAL LOW (ref 3.5–5.2)
Alkaline Phosphatase: 85 U/L (ref 39–117)
BUN: 14 mg/dL (ref 6–23)
CO2: 24 mEq/L (ref 19–32)
Calcium: 9.7 mg/dL (ref 8.4–10.5)
Chloride: 106 mEq/L (ref 96–112)
Creatinine, Ser: 0.76 mg/dL (ref 0.50–1.10)
GFR calc Af Amer: >90 mL/min (ref >90)
GFR calc non Af Amer: >90 mL/min (ref >90)
Glucose, Bld: 128 mg/dL — ABNORMAL HIGH (ref 70–99)
Potassium: 3.1 mEq/L — ABNORMAL LOW (ref 3.5–5.1)
Sodium: 143 mEq/L (ref 135–145)
Total Bilirubin: 0.5 mg/dL (ref 0.3–1.2)
Total Protein: 6.7 g/dL (ref 6.0–8.3)

## 2012-05-31 LAB — URINE MICROSCOPIC-ADD ON

## 2012-05-31 LAB — TROPONIN I: Troponin I: <0.3 ng/mL (ref <0.30)

## 2012-05-31 MED ORDER — BENAZEPRIL HCL 10 MG PO TABS
20.0000 mg | ORAL_TABLET | Freq: Every day | ORAL | Status: DC
  Administered 2012-05-31: 20 mg via ORAL
  Filled 2012-05-31 (×2): qty 2

## 2012-05-31 MED ORDER — CLONIDINE HCL 0.2 MG PO TABS
0.3000 mg | ORAL_TABLET | Freq: Once | ORAL | Status: AC
  Administered 2012-05-31: 0.3 mg via ORAL
  Filled 2012-05-31: qty 3

## 2012-05-31 MED ORDER — AMLODIPINE BESYLATE 5 MG PO TABS
5.0000 mg | ORAL_TABLET | Freq: Once | ORAL | Status: AC
  Administered 2012-05-31: 5 mg via ORAL
  Filled 2012-05-31: qty 1

## 2012-05-31 MED ORDER — LABETALOL HCL 5 MG/ML IV SOLN
20.0000 mg | Freq: Once | INTRAVENOUS | Status: DC
  Filled 2012-05-31: qty 4

## 2012-05-31 MED ORDER — LABETALOL HCL 100 MG PO TABS
100.0000 mg | ORAL_TABLET | Freq: Once | ORAL | Status: AC
  Administered 2012-05-31: 100 mg via ORAL
  Filled 2012-05-31: qty 1

## 2012-05-31 MED ORDER — HYDROCODONE-ACETAMINOPHEN 5-325 MG PO TABS
2.0000 | ORAL_TABLET | Freq: Once | ORAL | Status: AC
  Administered 2012-05-31: 2 via ORAL
  Filled 2012-05-31: qty 2

## 2012-05-31 MED ORDER — HYDROCODONE-ACETAMINOPHEN 5-325 MG PO TABS: 2.0000 | ORAL_TABLET | ORAL | Status: AC | PRN

## 2012-05-31 NOTE — ED Provider Notes (Signed)
History  This chart was scribed for Glynn Octave, MD by Erskine Emery. This patient was seen in room APA07/APA07 and the patient's care was started at 15:11.   CSN: [id number]  Arrival date & time 05/31/12  1453   First MD Initiated Contact with Patient 05/31/12 1511      Chief Complaint  Patient presents with  . Hypertension    (Consider location/radiation/quality/duration/timing/severity/associated sxs/prior treatment) HPI [name] is a 46 y.o. female who presents to the Emergency Department complaining of high blood pressure and left leg pain with no associated headache, SOB, visual disturbances, chest pain, or dizziness. Pt reports her blood pressure is high normally and admits that she has not taken any of her medications today. Pt was seen by Dr. Claybon Jabs at [facility] wound center today where her left leg was wrapped for an open sore and ankle fracture (from 4 months ago). The doctor noticed her blood pressure was high so he sent her to the ED. Pt reports the leg issues are painful but healing well. Pt reports she has been taking hydrocodone for this pain. Pt has a h/o DM and HTN for which she ordinarily takes medication. Dr. [name] is the pt's PCP who she has an appointment with soon; she will also be seeing her diabetes doctor and [facility] orthopedics this week.      Past Medical History  Diagnosis Date  . Alcohol use   . Diabetes mellitus, type 2   . Obesity   . Hyperlipidemia   . Hypertension   . Anxiety   . Panic attacks   . Hypercholesteremia     Past Surgical History  Procedure Date  . Dilation and curettage of uterus     Family History  Problem Relation Age of Onset  . Hypertension Mother   . Hypertension Father     CABG   . Hypertension Sister   . Heart attack Mother   . Arthritis    . Cancer    . Diabetes    . Asthma      History  Substance Use Topics  . Smoking status: Current Everyday Smoker -- 1.0 packs/day    Types: Cigarettes  .  Smokeless tobacco: Not on file  . Alcohol Use: No     stopped jan 2012    OB History    Grav Para Term Preterm Abortions TAB SAB Ect Mult Living                  Review of Systems A complete 10 system review of systems was obtained and all systems are negative except as noted in the HPI and PMH.    Allergies  Review of patient's allergies indicates no known allergies.  Home Medications   Current Outpatient Rx  Name Route Sig Dispense Refill  . ALPRAZOLAM 0.5 MG PO TABS  TAKE 1 TABLET BY MOUTH 3 TIMES A DAY. 90 tablet 1  . AMLODIPINE BESYLATE 5 MG PO TABS Oral Take 1 tablet (5 mg total) by mouth daily. 30 tablet 11  . BENAZEPRIL-HYDROCHLOROTHIAZIDE 20-12.5 MG PO TABS  Two tablets once daily at 6am 60 tablet 11  . VITAMIN D3 50000 UNITS PO CAPS Oral Take 50,000 Units by mouth once a week. 12 capsule 1  . CLONIDINE HCL 0.3 MG PO TABS  Dose increase effective 05/10/2012. One tablet at 6am, 2pm and 10pm 90 tablet 11  . HYDROCODONE-ACETAMINOPHEN 5-325 MG PO TABS Oral Take 1 tablet by mouth every 4 (four) hours as needed. *  Take one tablet every 4 to 6 hours as needed for pain*    . LABETALOL HCL 100 MG PO TABS Oral Take 1 tablet (100 mg total) by mouth 2 (two) times daily. 60 tablet 3  . MELOXICAM 7.5 MG PO TABS Oral Take 7.5 mg by mouth daily.    Marland Kitchen MOTRIN 800 MG PO TABS  TAKE 1 TABLET BY MOUTH ONCE A DAY AS NEEDED. 30 each 2  . PRAVASTATIN SODIUM 40 MG PO TABS Oral Take 1 tablet (40 mg total) by mouth every evening. 30 tablet 11  . ULTRAM 50 MG PO TABS  TAKE (1) TABLET BY MOUTH TWICE A DAY AS NEEDED. 30 each 3  . HYDROCODONE-ACETAMINOPHEN 5-325 MG PO TABS Oral Take 2 tablets by mouth every 4 (four) hours as needed for pain. 10 tablet 0    Triage Vitals: BP 225/143  Pulse 109  Temp 98.4 F (36.9 C) (Oral)  Resp 18  Ht 5\' 1"  (1.549 m)  Wt 202 lb (91.627 kg)  BMI 38.17 kg/m2  SpO2 97%  LMP 05/31/2012  Physical Exam  Nursing note and vitals reviewed. Constitutional: She is  oriented to person, place, and time. She appears well-developed and well-nourished. No distress.       No acute distress  HENT:  Head: Normocephalic and atraumatic.  Eyes: EOM are normal.  Neck: Neck supple. No tracheal deviation present.  Cardiovascular: Normal rate and normal heart sounds.   Pulmonary/Chest: Effort normal and breath sounds normal. No respiratory distress.  Abdominal: Soft. She exhibits no distension. There is no tenderness. There is no rebound and no guarding.  Musculoskeletal: Normal range of motion. She exhibits no edema.       Left leg: professionally bandaged. Toes arm warm. Capillary refill is less than 2 seconds. Good dorsalis pedis pulse, +2.  Neurological: She is alert and oriented to person, place, and time. No cranial nerve deficit. Coordination normal.       Visual fields intact  Skin: Skin is warm and dry.  Psychiatric: She has a normal mood and affect. Her behavior is normal.    ED Course  Procedures (including critical care time) DIAGNOSTIC STUDIES: Oxygen Saturation is 97% on room air, adequate by my interpretation.    COORDINATION OF CARE: 15:19--I evaluated the patient and we discussed a treatment plan including urinalysis to which the pt agreed.   15:30--Medication orders: Amlodipine (Norvasc) tablet 5 mg--once   Clondine (Catapres) tablet 0.3 mg--once   Benazepril (Lotensin) tablet 20 mg--daily  15:45--Medication order: Labetalol (Normodyne) tablet 100 mg--once  16:15--Medication order: Hydrocodone-acetaminophen (Norco/Vicodin) 5-325 mg per tablet, 2 tablets--once  16:39--I rechecked the pt. We discussed the findings of her labs and imaging. I instructed her to make sure to take her medications as prescribed and to follow up with Dr. [name].   17:17--I rechecked the pt. Her blood pressure is lower but still high. Pt thinks she has not been given all of her blood pressure medications yet.   Labs Reviewed  CBC WITH DIFFERENTIAL - Abnormal;  Notable for the following:    Hemoglobin 16.1 (*)     All other components within normal limits  COMPREHENSIVE METABOLIC PANEL - Abnormal; Notable for the following:    Potassium 3.1 (*)     Glucose, Bld 128 (*)     Albumin 3.2 (*)     All other components within normal limits  URINALYSIS, ROUTINE W REFLEX MICROSCOPIC - Abnormal; Notable for the following:    Specific Gravity, Urine >1.030 (*)  Hgb urine dipstick SMALL (*)     Bilirubin Urine SMALL (*)     Protein, ur >300 (*)     All other components within normal limits  URINE MICROSCOPIC-ADD ON - Abnormal; Notable for the following:    Squamous Epithelial / LPF FEW (*)     Casts HYALINE CASTS (*)     All other components within normal limits  TROPONIN I   Ct Head Wo Contrast  05/31/2012  *RADIOLOGY REPORT*  Clinical Data: Hypertension.  Diabetes.  CT HEAD WITHOUT CONTRAST  Technique:  Contiguous axial images were obtained from the base of the skull through the vertex without contrast.  Comparison: 09/24/2004  Findings: Stable dolichoectatic vertebrobasilar system noted. Incidental note is made of a cavum septi pellucidi and cavum vergae.  The brain stem, cerebellum, cerebral peduncles, thalami, basal ganglia, basilar cisterns, and ventricular system appear unremarkable.  Periventricular and corona radiata white matter hypodensities are most compatible with chronic ischemic microvascular white matter disease.  No intracranial hemorrhage, mass lesion, or acute infarction is identified.  Old left medial orbital wall fracture noted.  IMPRESSION:  1.  No acute intracranial findings. 2.  Old left medial orbital wall fracture. 3.  Periventricular and corona radiata white matter hypodensities are most compatible with chronic ischemic microvascular white matter disease.  Original Report Authenticated By: Dellia Cloud, M.D.     1. Hypertension       MDM  Sent from wound care center in [place] with elevated blood pressure. History of  hypertension, did not take medications today. Changes made by Dr [name] on 7/16.  No chest pain, SOB, headache, visual change. Home meds ordered.  Cr stable. Troponin neg. CT negative. No evidence of end organ damage.  Blood pressure improved with medications.  Stable for outpatient followup.   Date: 05/31/2012  Rate: 98  Rhythm: normal sinus rhythm  QRS Axis: left  Intervals: normal  ST/T Wave abnormalities: normal  Conduction Disutrbances:none  Narrative Interpretation: septal Q waves, LVH  Old EKG Reviewed: unchanged        I personally performed the services described in this documentation, which was scribed in my presence.  The recorded information has been reviewed and considered.    Glynn Octave, MD 05/31/12 [phone]

## 2012-05-31 NOTE — Telephone Encounter (Signed)
Pt current at ed.

## 2012-05-31 NOTE — ED Notes (Signed)
Calling multiple family members to find transportation home

## 2012-05-31 NOTE — Telephone Encounter (Signed)
Please advise 

## 2012-05-31 NOTE — Telephone Encounter (Signed)
Go to the Ed if BP this high, let her know

## 2012-05-31 NOTE — ED Notes (Signed)
Pt was seen in dr. Isidore Moos today, her blood pressure was noted to be elevated, unable to contact pt's pcp and pt was advised to come to er, pt's only complaint in pain in left lower leg due to fx. And open sore, pt is wearing post op shoe to left foot and ace wrap noted to left lower leg.

## 2012-05-31 NOTE — Telephone Encounter (Signed)
That's where she needs to be thank you

## 2012-06-03 ENCOUNTER — Telehealth: Payer: Self-pay | Admitting: Family Medicine

## 2012-06-03 NOTE — Telephone Encounter (Signed)
Patient is aware 

## 2012-06-06 ENCOUNTER — Ambulatory Visit (HOSPITAL_COMMUNITY): Payer: PRIVATE HEALTH INSURANCE | Admitting: Specialist

## 2012-06-06 DIAGNOSIS — M25579 Pain in unspecified ankle and joints of unspecified foot: Secondary | ICD-10-CM | POA: Insufficient documentation

## 2012-06-06 DIAGNOSIS — E119 Type 2 diabetes mellitus without complications: Secondary | ICD-10-CM | POA: Insufficient documentation

## 2012-06-06 DIAGNOSIS — M25519 Pain in unspecified shoulder: Secondary | ICD-10-CM | POA: Insufficient documentation

## 2012-06-06 DIAGNOSIS — R269 Unspecified abnormalities of gait and mobility: Secondary | ICD-10-CM | POA: Insufficient documentation

## 2012-06-06 DIAGNOSIS — I10 Essential (primary) hypertension: Secondary | ICD-10-CM | POA: Insufficient documentation

## 2012-06-06 DIAGNOSIS — IMO0001 Reserved for inherently not codable concepts without codable children: Secondary | ICD-10-CM | POA: Insufficient documentation

## 2012-06-07 ENCOUNTER — Other Ambulatory Visit (HOSPITAL_COMMUNITY): Payer: PRIVATE HEALTH INSURANCE

## 2012-06-07 ENCOUNTER — Other Ambulatory Visit: Payer: Self-pay | Admitting: Family Medicine

## 2012-06-07 ENCOUNTER — Telehealth: Payer: Self-pay | Admitting: Family Medicine

## 2012-06-07 NOTE — Telephone Encounter (Signed)
Needs to call the pharmacy. Has been faxed

## 2012-06-08 ENCOUNTER — Ambulatory Visit (HOSPITAL_COMMUNITY)
Admission: RE | Admit: 2012-06-08 | Discharge: 2012-06-08 | Disposition: A | Discharge status: Home / Self Care | Payer: PRIVATE HEALTH INSURANCE | Source: Ambulatory Visit | Attending: Family Medicine | Admitting: Family Medicine

## 2012-06-08 ENCOUNTER — Other Ambulatory Visit (HOSPITAL_COMMUNITY): Payer: Self-pay | Admitting: Family Medicine

## 2012-06-08 ENCOUNTER — Telehealth: Payer: Self-pay | Admitting: Family Medicine

## 2012-06-08 ENCOUNTER — Other Ambulatory Visit: Payer: Self-pay

## 2012-06-08 ENCOUNTER — Other Ambulatory Visit: Payer: Self-pay | Admitting: Family Medicine

## 2012-06-08 DIAGNOSIS — R928 Other abnormal and inconclusive findings on diagnostic imaging of breast: Secondary | ICD-10-CM

## 2012-06-08 DIAGNOSIS — N61 Mastitis without abscess: Secondary | ICD-10-CM | POA: Insufficient documentation

## 2012-06-08 MED ORDER — IBUPROFEN 800 MG PO TABS: 800.0000 mg | ORAL_TABLET | Freq: Three times a day (TID) | ORAL | Status: AC | PRN

## 2012-06-08 MED ORDER — DOXYCYCLINE HYCLATE 100 MG PO CAPS: 100.0000 mg | ORAL_CAPSULE | Freq: Two times a day (BID) | ORAL | Status: AC

## 2012-06-08 NOTE — Telephone Encounter (Signed)
Cal;l re recurrent breast abccess, request that I prescribe antibiotics, pt also requested pain meds,for  Breast pain, I know she is on chronic pain meds, so will need to look into any script for pain meds, antibiotic x 3 weeks will be sent

## 2012-06-08 NOTE — Telephone Encounter (Signed)
pls let pt know I spoke with th mD, antibiotic has alreadty been prescribed, and please document in your response to her the number of pain pills she recently obtained, so I will not be prescribing any at this time. Pls advise and send in ibuprofen 800mg  one 3e times daily #21, this will help with acute pain management

## 2012-06-08 NOTE — Telephone Encounter (Signed)
Pt aware.

## 2012-06-11 LAB — CULTURE, ROUTINE-ABSCESS: Gram Stain: NONE SEEN

## 2012-06-13 ENCOUNTER — Ambulatory Visit: Payer: PRIVATE HEALTH INSURANCE | Admitting: Family Medicine

## 2012-06-14 ENCOUNTER — Other Ambulatory Visit (HOSPITAL_COMMUNITY): Payer: PRIVATE HEALTH INSURANCE

## 2012-06-15 ENCOUNTER — Ambulatory Visit (HOSPITAL_COMMUNITY)
Admission: RE | Admit: 2012-06-15 | Discharge: 2012-06-15 | Disposition: A | Discharge status: Home / Self Care | Payer: PRIVATE HEALTH INSURANCE | Source: Ambulatory Visit | Attending: Family Medicine | Admitting: Family Medicine

## 2012-06-15 DIAGNOSIS — M75100 Unspecified rotator cuff tear or rupture of unspecified shoulder, not specified as traumatic: Secondary | ICD-10-CM | POA: Insufficient documentation

## 2012-06-15 DIAGNOSIS — M249 Joint derangement, unspecified: Secondary | ICD-10-CM | POA: Insufficient documentation

## 2012-06-15 DIAGNOSIS — M25519 Pain in unspecified shoulder: Secondary | ICD-10-CM | POA: Insufficient documentation

## 2012-06-15 DIAGNOSIS — M898X9 Other specified disorders of bone, unspecified site: Secondary | ICD-10-CM | POA: Insufficient documentation

## 2012-06-15 DIAGNOSIS — IMO0002 Reserved for concepts with insufficient information to code with codable children: Secondary | ICD-10-CM | POA: Insufficient documentation

## 2012-06-15 DIAGNOSIS — M25511 Pain in right shoulder: Secondary | ICD-10-CM

## 2012-06-16 ENCOUNTER — Ambulatory Visit (HOSPITAL_COMMUNITY)
Admission: RE | Admit: 2012-06-16 | Discharge: 2012-06-16 | Disposition: A | Discharge status: Home / Self Care | Payer: PRIVATE HEALTH INSURANCE | Source: Ambulatory Visit | Attending: Family Medicine | Admitting: Family Medicine

## 2012-06-16 NOTE — Evaluation (Signed)
Occupational Therapy Evaluation  Patient Details  Name: [name] MRN: [id number] Date of Birth: Sep 01, 1966  Today's Date: 06/16/2012 Time: 1025-1100 OT Time Calculation (min): 35 min  Visit#: 1  of 1   Re-eval: 06/16/12     Authorization: Medicare  Authorization Time Period:    Authorization Visit#: 1  of 1    Past Medical History:  Past Medical History  Diagnosis Date  . Alcohol use   . Diabetes mellitus, type 2   . Obesity   . Hyperlipidemia   . Hypertension   . Anxiety   . Panic attacks   . Hypercholesteremia    Past Surgical History:  Past Surgical History  Procedure Date  . Dilation and curettage of uterus     Subjective Symptoms/Limitations Symptoms: S:  I need a power wheelchair while my ankle is healing.    06/16/12 RE:     [name]  [address]  [place], [place] [address]  DOB:  18-Jan-2066  Insurance:  [employer] [place] [employer]  To Whom It May Concern,  [name] was referred to this clinic for a power wheelchair assessment today due to abnormality of gait.  She has a medical history that includes CHF, diabetes, diabetic neuropathy, HTN, anxiety, depression, high cholesterol, arthritis, right shoulder pain, and Charcot Foot.  Approximately 3 months ago, she fell and fractured her left ankle.  Due to her history of Charcot foot, she is not supposed to be up walking.  She also has an open wound on her left shin that is being tended to by the wound center in [place].    She states that she would like a power wheelchair for use while her ankle is healing so that she can get around her home.    [name] lives alone.  She has a [employer] for 2  hours per day that assist her with dressing and bathing activities.  The aide also assists with cleaning and meal prep.  Prior to her fall and consequent ankle fracture, she was independent with all BADLs and IADLs.  She lives in a one story home.     A FULL PHYSICAL ASSESSMENT REVEALS THE FOLLOWING     Existing Equipment: [name] does not utilize any type of DME. Transfers:   [name] transferred independently and ambulated 10' x 2 independently.  Head and Neck: WFL.   Trunk:  WFL.   Pelvis:    WFL.  Hip:   Bilateral AROM and strength is WFL.    Knees: Right AROM and strength is WNL.  Left AROM WNL, strength not tested.    Feet and Ankles: Right AROM and strength is WNL.  Left not assessed due to recent fracture.    Upper Extremities: BUE AROM and strength is WNL, except for her right shoulder, which is limited to 75% range and 4/5 strength.  Weight Shifting Ability: [name] is independent with weight shifting.  Skin Integrity: Open wound on left shin.    Cognition:  WFL.  Activity Tolerance:  Good.   GOALS/OBJECTIVE OF SEATING INTERVENTION  Recommendations: [name] would benefit from a power wheelchair for use in her home while her ankle is healing. She is not able to propel a standard wheelchair due to her right shoulder pain.  She would not be able to use knee walker, due to the open wound on her shin.    A power wheelchair would increase Ms. Trickey  would allow her to follow her weightbearing precautions,  while increasing her safety, independence with daily activities, and her quality of life.    If you require any further information concerning [name]'s positioning, independence or mobility needs; or any further information why a lesser device will not work, please do not hesitate to contact me at [facility], [address], [place] [address] [phone].   Thank you for this referral,   ____________________       __________ Sondra Barges, OTR/L        Date       Problem List Patient Active Problem List  Diagnosis  . DIABETES MELLITUS, TYPE II  . HYPERLIPIDEMIA  . OBESITY  . ANXIETY  . ALCOHOL USE  . DEPRESSION  . HYPERTENSION  . ALLERGIC RHINITIS  . SHOULDER PAIN, RIGHT  . BACK PAIN  . BURSITIS, RIGHT SHOULDER  . INSOMNIA  .  FATIGUE  . RECTAL BLEEDING, HX OF  . NICOTINE ADDICTION  . Breast mass, left  . Ankle fracture, left  . Vitamin d deficiency  . Diabetic Charcot foot  . Ankle fracture, lateral malleolus, closed  . Medical non-compliance  . Closed left ankle fracture  . Ankle fracture  . Foot pain  . Leg ulcer, left  . Shoulder pain, right  . Poor mobility       GO Functional Assessment Tool Used: clinical observation Functional Limitation: Mobility: Walking and moving around Mobility: Walking and Moving Around Current Status [phone]): At least 1 percent but less than 20 percent impaired, limited or restricted Mobility: Walking and Moving Around Goal Status [phone]): At least 1 percent but less than 20 percent impaired, limited or restricted Mobility: Walking and Moving Around Discharge Status [phone]): At least 1 percent but less than 20 percent impaired, limited or restricted  [name], OTR/L  06/16/2012, 1:34 PM  Physician Documentation Your signature is required to indicate approval of the treatment plan as stated above.  Please sign and either send electronically or make a copy of this report for your files and return this physician signed original.  Please mark one 1.__approve of plan  2. ___approve of plan with the following conditions.   ______________________________                                                          _____________________ Physician Signature                                                                                                             Date

## 2012-06-29 ENCOUNTER — Ambulatory Visit: Payer: PRIVATE HEALTH INSURANCE | Admitting: Family Medicine

## 2012-07-06 NOTE — Progress Notes (Signed)
Pt has appt with dr. [name] on 07/12/2012

## 2012-07-12 ENCOUNTER — Encounter: Payer: Self-pay | Admitting: Family Medicine

## 2012-07-12 ENCOUNTER — Ambulatory Visit (INDEPENDENT_AMBULATORY_CARE_PROVIDER_SITE_OTHER): Payer: PRIVATE HEALTH INSURANCE | Admitting: Family Medicine

## 2012-07-12 VITALS — BP 148/96 | HR 84 | Resp 18 | Ht 62.0 in | Wt 197.0 lb

## 2012-07-12 DIAGNOSIS — M25519 Pain in unspecified shoulder: Secondary | ICD-10-CM

## 2012-07-12 DIAGNOSIS — Z23 Encounter for immunization: Secondary | ICD-10-CM

## 2012-07-12 DIAGNOSIS — E119 Type 2 diabetes mellitus without complications: Secondary | ICD-10-CM

## 2012-07-12 DIAGNOSIS — E785 Hyperlipidemia, unspecified: Secondary | ICD-10-CM

## 2012-07-12 DIAGNOSIS — I10 Essential (primary) hypertension: Secondary | ICD-10-CM

## 2012-07-12 DIAGNOSIS — N63 Unspecified lump in unspecified breast: Secondary | ICD-10-CM

## 2012-07-12 DIAGNOSIS — F411 Generalized anxiety disorder: Secondary | ICD-10-CM

## 2012-07-12 DIAGNOSIS — N6320 Unspecified lump in the left breast, unspecified quadrant: Secondary | ICD-10-CM

## 2012-07-12 MED ORDER — AMLODIPINE BESYLATE 10 MG PO TABS: 10.0000 mg | ORAL_TABLET | Freq: Every day | ORAL | Status: DC

## 2012-07-12 NOTE — Patient Instructions (Addendum)
Pelvic and breast  In early December.  You will be given an appointment to see Dr Romeo Apple at checkout re right shoulder.  Flu vaccine today.  Blood pressure much improved. Dose increase on amlodipine to 10mg  one daily. OK to take TWO 5mg  tablets once daily till done  You are referred to Dr Lovell Sheehan re persistent left breast mass under the nipple

## 2012-07-13 ENCOUNTER — Other Ambulatory Visit: Payer: Self-pay | Admitting: Family Medicine

## 2012-07-15 ENCOUNTER — Other Ambulatory Visit: Payer: Self-pay | Admitting: Family Medicine

## 2012-07-19 ENCOUNTER — Ambulatory Visit (INDEPENDENT_AMBULATORY_CARE_PROVIDER_SITE_OTHER): Payer: PRIVATE HEALTH INSURANCE | Admitting: Orthopedic Surgery

## 2012-07-19 ENCOUNTER — Encounter: Payer: Self-pay | Admitting: Orthopedic Surgery

## 2012-07-19 VITALS — BP 182/100 | Ht 62.0 in | Wt 197.0 lb

## 2012-07-19 DIAGNOSIS — M75101 Unspecified rotator cuff tear or rupture of right shoulder, not specified as traumatic: Secondary | ICD-10-CM | POA: Insufficient documentation

## 2012-07-19 DIAGNOSIS — S43429A Sprain of unspecified rotator cuff capsule, initial encounter: Secondary | ICD-10-CM

## 2012-07-19 MED ORDER — HYDROCODONE-ACETAMINOPHEN 5-325 MG PO TABS: 1.0000 | ORAL_TABLET | Freq: Four times a day (QID) | ORAL | Status: DC | PRN

## 2012-07-19 NOTE — Progress Notes (Signed)
Patient ID: [name], female   DOB: Apr 11, 1966, 46 y.o.   MRN: [id number] Chief Complaint  Patient presents with  . Shoulder Pain    right shoulder pain, gradual onset x 2 months    Referral from Dr. Syliva Overman  The patient has a history of Charcot foot with recent referral to foot specialist. She's also developed an ulcer over her left leg she is being treated by the wound care center with Dr. Tanda Rockers. She presents with a two-month history of right shoulder pain no history of recent trauma. Symptoms came on gradually. She does not remember a significant recent fall though she did fracture her ankle secondary to a fall about 8 months ago was treated here with casting and bracing.  She now complains of sharp throbbing constant 10 out of 10 right shoulder pain which is worse with everything she does and is not improved by anything including ibuprofen. She complains of catching locking of the right shoulder with swelling and some numbness and tingling associated over the right arm. She had an MRI which showed a chronic tear of her rotator cuff with supraspinatus and subscapularis complete tear near complete tear of the infraspinatus severe retraction of each tendon 3-5 cm with atrophy of all 3 tendons and tendinopathy of the biceps tendon. She has bulky acromioclavicular degenerative change and posterior subluxation of the glenoid consistent with glenohumeral instability. She has severe degeneration of the labrum. The acromion is type I.  The patient's allergies are recorded, the medical and surgical history have been recorded, medications family history and social history have been recorded and all have been reviewed.  Right Shoulder Exam   Tenderness  The patient is experiencing tenderness in the acromion.  Range of Motion  Active Abduction: 100  Passive Abduction: normal  Right shoulder forward flexion: 120.  External Rotation: 50   Muscle Strength  Internal Rotation: 5/5    External Rotation: 4/5  Supraspinatus: 4/5  Subscapularis: 5/5  Biceps: 5/5   Tests  Apprehension: negative Drop Arm: negative Impingement: positive  Other  Erythema: absent Sensation: normal Pulse: present  Comments:  Exam findings do not match her MRI findings. The MRI shows what appears to be a subscapularis and supraspinatus tear as well as infraspinatus tear with retraction and chronic changes as evidence by muscle atrophy   Left Shoulder Exam  Left shoulder exam is normal.  Tenderness  The patient is experiencing no tenderness.     Range of Motion  The patient has normal left shoulder ROM.  Muscle Strength  The patient has normal left shoulder strength.  Tests  Apprehension: negative Drop Arm: negative Impingement: negative  Other  Erythema: absent Scars: absent Sensation: normal Pulse: present     Assessment torn rotator cuff with atrophy and fat infiltration of the subscapularis supraspinatus and infraspinatus.  The patient is also diabetic has peripheral neuropathy. She's not a good surgical candidate. She also has an ulcer on the left leg which is still open  So at this time I recommend therapy because she still has active forward elevation. Rotator cuff tear and this setting is not associated with positive outcomes from surgical repair mainly because of the fatty infiltration  She also has an open wound, so once the wound has closed reevaluate her in the meantime she should have physical therapy. She can have Norco for pain 5 mg only.

## 2012-07-19 NOTE — Patient Instructions (Addendum)
Call the office when the wound in your ankle has closed   Call hospital to arrange PT   Rotator cuff tear   Rotator Cuff Tear The rotator cuff is four tendons that assist in the motion of the shoulder. A rotator cuff tear is a tear in one of these four tendons. It is characterized by pain and weakness of the shoulder. The rotator cuff tendons surround the shoulder ball and socket joint (humeral head). The rotator cuff tendons attach to the shoulder blade (scapula) on one side and the upper arm bone (humerus) on the other side. The rotator cuff is essential for shoulder stability and shoulder motion. SYMPTOMS    Pain around the shoulder, often at the outer portion of the upper arm.   Pain that is worse with shoulder function, especially when reaching overhead or lifting.   Weakness of the shoulder muscles.   Aching when not using your arm; often, pain awakens you at night, especially when sleeping on the affected side.   Tenderness, swelling, warmth, or redness over the outer aspect of the shoulder.   Loss of strength.   Limited motion of the shoulder, especially reaching behind (reaching into one's back pocket) or across your body.   A crackling sound (crepitation) when moving the shoulder.   Biceps tendon pain (in the front of the shoulder) and inflammation, worse with bending the elbow or lifting.  CAUSES    Strain from sudden increase in amount or intensity of activity.   Direct blow or injury to the shoulder.   Aging, wear from from normal use.   Roof of the shoulder (acromial) spur.  RISK INCREASES WITH:    Contact sports (football, wrestling, or boxing).   Throwing or hitting sports (baseball, tennis, or volleyball).   Weightlifting and bodybuilding.   Heavy labor.   Previous injury to rotator cuff.   Failure to warm up properly before activity.   Inadequate protective equipment.   Increasing age.   Spurring of the outer end of the scapula (acromion).    Cortisone injections.   Poor shoulder strength and flexibility.  PREVENTION  Warm up and stretch properly before activity.   Allow time for rest and recovery between practices and competition.   Maintain physical fitness:   Cardiovascular fitness.   Shoulder flexibility.   Strength and endurance of the rotator cuff muscles and muscles of the shoulder blade.   Learn and use proper technique when throwing or hitting.  PROGNOSIS Surgery is often needed. Although, symptoms may go away by themselves. RELATED COMPLICATIONS    Persistent pain that may progress to constant pain.   Shoulder stiffness, frozen shoulder syndrome, or loss of motion.   Recurrence of symptoms, especially if treated without surgery.   Inability to return to same level of sports, even with surgery.   Persistent weakness.   Risks of surgery, including infection, bleeding, injury to nerves, shoulder stiffness, weakness, re-tearing of the rotator cuff tendon.   Deltoid detachment, acromial fracture, and persistent pain.  TREATMENT Treatment involves the use of ice and medicine to reduce pain and inflammation. Strengthening and stretching exercise are usually recommended. These exercises may be completed at home or with a therapist. You may also be instructed to modify offending activities. Corticosteroid injections may be given to reduce inflammation. Surgery is usually recommended for athletes. Surgery has the best chance for a full recovery. Surgery involves:  Removal of an inflamed bursa.   Removal of an acromial spur if present.   Suturing  the torn tendon back together.  Rotator cuff surgeries may be preformed either arthroscopically or through an open incision. Recovery typically takes 6 to 12 months. MEDICATION  If pain medicine is necessary, then nonsteroidal anti-inflammatory medicines, such as aspirin and ibuprofen, or other minor pain relievers, such as acetaminophen, are often recommended.    Do not take pain medicine for 7 days before surgery.   Prescription pain relievers are usually only prescribed after surgery. Use only as directed and only as much as you need.   Corticosteroid injections may be given to reduce inflammation. However, there is a limited number of times the joint may be injected with these medicines.  HEAT AND COLD  Cold treatment (icing) relieves pain and reduces inflammation. Cold treatment should be applied for 10 to 15 minutes every 2 to 3 hours for inflammation and pain and immediately after any activity that aggravates your symptoms. Use ice packs or massage the area with a piece of ice (ice massage).   Heat treatment may be used prior to performing the stretching and strengthening activities prescribed by your caregiver, physical therapist, or athletic trainer. Use a heat pack or soak the injury in warm water.  SEEK MEDICAL CARE IF:    Symptoms get worse or do not improve in 4 to 6 weeks despite treatment.   You experience pain, numbness, or coldness in the hand.   Blue, gray, or dark color appears in the fingernails.   New, unexplained symptoms develop (drugs used in treatment may produce side effects).  Document Released: 10/12/2005 Document Revised: 10/01/2011 Document Reviewed: 01/24/2009 [facility] Patient Information 2012 [place], [place].Surgery for Rotator Cuff Tear with Rehab Rotator cuff surgery is only recommended for individuals who have experienced persistent disability for greater than 3 months of non-surgical (conservative) treatment. Surgery is not necessary but is recommended for individuals who experience difficulty completing daily activities or athletes who are unable to compete. Rotator cuff tears do not usually heal without surgical intervention. If left alone small rotator cuff tears usually become larger. Younger athletes who have a rotator cuff tear may be recommended for surgery without attempting conservative rehabilitation. The  purpose of surgery is to regain function of the shoulder joint and eliminate pain associated with the injury. In addition to repairing the tendon tear, the surgery often removes a portion of the bony roof of the shoulder (acromion) as well as the chronically thickened and inflamed membrane below the acromion (subacromial bursa). REASONS NOT TO OPERATE    Infection of the shoulder.   Inability to complete a rehabilitation program.   Patients who have other conditions (emotional or psychological) conditions that contribute to their shoulder condition.  RISKS AND COMPLICATIONS  Infection.   Re-tear of the rotator cuff tendons or muscles.   Shoulder stiffness and/or weakness.   Inability to compete in athletics.   Acromioclavicular (AC) joint paint.   Risks of surgery: infection, bleeding, nerve damage, or damage to surrounding tissues.  TECHNIQUE There are different surgical procedures used to treat rotator cuff tears. The type of procedure depends on the extent of injury as well as the surgeon's preference. All of the surgical techniques for rotator cuff tears have the same goal of repairing the torn tendon, removing part of the acromion, and removing the subacromial bursa. There are two main types of procedures: arthroscopic and open incision. Arthroscopic procedures are usually completed and you go home the same day as surgery (outpatient). These procedures use multiple small incisions in which tools and a video  camera are placed to work on the shoulder. An electric shaver removes the bursa, then a power burr shaves down the portion of the acromion that places pressure on the rotator cuff. Finally the rotator cuff is sewed (sutured) back to the humeral head. Open incision procedures require a larger incision. The deltoid muscle is detached from the acromion and a ligament in the shoulder (coracoacromial) is cut in order for the surgeon to access the rotator cuff. The subacromial bursa is  removed as well as part of the acromion to give the rotator cuff room to move freely. The torn tendon is then sutured to the humeral head. After the rotator cuff is repaired, then the deltoid is reattached and the incision is closed up.   RECOVERY    Post-operative care depends on the surgical technique and the preferences of your therapist.   Keep the wound clean and dry for the first 10 to 14 days after surgery.   Keep your shoulder and arm in the sling provided to you for as long as you have been instructed to.   You will be given pain medications by your caregiver.   Passive (without using muscles) shoulder movements may be begun immediately after surgery.   It is important to follow through with you rehabilitation program in order to have the best possible recovery.  RETURN TO SPORTS    The rehabilitation period will depend on the sport and position you play as well as the success of the operation.   The minimum recovery period is 6 months.   You must have regained complete shoulder motion and strength before returning to sports.  SEEK IMMEDIATE MEDICAL CARE IF:    Any medications produce adverse side effects.   Any complications from surgery occur:   Pain, numbness, or coldness in the extremity operated upon.   Discoloration of the nail beds (they become blue or gray) of the extremity operated upon.   Signs of infections (fever, pain, inflammation, redness, or persistent bleeding).  EXERCISES   RANGE OF MOTION (ROM) AND STRETCHING EXERCISES - Rotator Cuff Tear, Surgery For These exercises may help you restore your elbow mobility once your physician has discontinued your immobilization period. Beginning these before your provider's approval may result in delayed healing. Your symptoms may resolve with or without further involvement from your physician, physical therapist or athletic trainer. While completing these exercises, remember:    Restoring tissue flexibility helps  normal motion to return to the joints. This allows healthier, less painful movement and activity.   An effective stretch should be held for at least 30 seconds. A stretch should never be painful. You should only feel a gentle lengthening or release in the stretch.  ROM - Pendulum   Bend at the waist so that your right / left arm falls away from your body. Support yourself with your opposite hand on a solid surface, such as a table or a countertop.   Your right / left arm should be perpendicular to the ground. If it is not perpendicular, you need to lean over farther. Relax the muscles in your right / left arm and shoulder as much as possible.   Gently sway your hips and trunk so they move your right / left arm without any use of your right / left shoulder muscles.   Progress your movements so that your right / left arm moves side to side, then forward and backward, and finally, both clockwise and counterclockwise.   Complete __________ repetitions in  each direction. Many people use this exercise to relieve discomfort in their shoulder as well as to gain range of motion.  Repeat __________ times. Complete this exercise __________ times per day. STRETCH - Flexion, Seated   Sit in a firm chair so that your right / left forearm can rest on a table or on a table or countertop. Your right / left elbow should rest below the height of your shoulder so that your shoulder feels supported and not tense or uncomfortable.   Keeping your right / left shoulder relaxed, lean forward at your waist, allowing your right / left hand to slide forward. Bend forward until you feel a moderate stretch in your shoulder, but before you feel an increase in your pain.   Hold __________ seconds. Slowly return to your starting position.  Repeat __________ times. Complete this exercise __________ times per day.   STRETCH - Flexion, Standing   Stand with good posture. With an underhand grip on your right / left and an  overhand grip on the opposite hand, grasp a broomstick or cane so that your hands are a little more than shoulder-width apart.   Keeping your right / left elbow straight and shoulder muscles relaxed, push the stick with your opposite hand to raise your right / left arm in front of your body and then overhead. Raise your arm until you feel a stretch in your right / left shoulder, but before you have increased shoulder pain.   Try to avoid shrugging your right / left shoulder as your arm rises by keeping your shoulder blade tucked down and toward your mid-back spine. Hold __________ seconds.   Slowly return to the starting position.  Repeat __________ times. Complete this exercise __________ times per day.   STRETCH - Abduction, Supine   Stand with good posture. With an underhand grip on your right / left and an overhand grip on the opposite hand, grasp a broomstick or cane so that your hands are a little more than shoulder-width apart.   Keeping your right / left elbow straight and shoulder muscles relaxed, push the stick with your opposite hand to raise your right / left arm out to the side of your body and then overhead. Raise your arm until you feel a stretch in your right / left shoulder, but before you have increased shoulder pain.   Try to avoid shrugging your right / left shoulder as your arm rises by keeping your shoulder blade tucked down and toward your mid-back spine. Hold __________ seconds.   Slowly return to the starting position.  Repeat __________ times. Complete this exercise __________ times per day.   ROM - Flexion, Active-Assisted  Lie on your back. You may bend your knees for comfort.   Grasp a broomstick or cane so your hands are about shoulder-width apart. Your right / left hand should grip the end of the stick/cane so that your hand is positioned "thumbs-up," as if you were about to shake hands.   Using your healthy arm to lead, raise your right / left arm overhead  until you feel a gentle stretch in your shoulder. Hold __________ seconds.   Use the stick/cane to assist in returning your right / left arm to its starting position.  Repeat __________ times. Complete this exercise __________ times per day.   STRETCH - External Rotation   Tuck a folded towel or small ball under your right / left upper arm. Grasp a broomstick or cane with an underhand grasp  a little more than shoulder width apart. Bend your elbows to 90 degrees.   Stand with good posture or sit in a chair without arms.   Use your strong arm to push the stick across your body. Do not allow the towel or ball to fall. This will rotate your right / left arm away from your abdomen. Using the stick turn/rotate your hand and forearm away from your body. Hold __________ seconds.  Repeat __________ times. Complete this exercise __________ times per day.   STRENGTHENING EXERCISES - Rotator Cuff Tear, Surgery For These exercises may help you begin to restore your elbow strength in the initial stage of your rehabilitation. Your physician will determine when you begin these exercises depending on the severity of your injury and the integrity of your repaired tissues. Beginning these before your provider's approval may result in delayed healing. While completing these exercises, remember:    Muscles can gain both the endurance and the strength needed for everyday activities through controlled exercises.   Complete these exercises as instructed by your physician, physical therapist or athletic trainer. Progress the resistance and repetitions only as guided.   You may experience muscle soreness or fatigue, but the pain or discomfort you are trying to eliminate should never worsen during these exercises. If this pain does worsen, stop and make certain you are following the directions exactly. If the pain is still present after adjustments, discontinue the exercise until you can discuss the trouble with your  clinician.  STRENGTH - Shoulder Flexion, Isometric   With good posture and facing a wall, stand or sit about 4-6 inches away.   Keeping your right / left elbow straight, gently press the top of your fist into the wall. Increase the pressure gradually until you are pressing as hard as you can without shrugging your shoulder or increasing any shoulder discomfort.   Hold __________ seconds. Release the tension slowly. Relax your shoulder muscles completely before you do the next repetition.  Repeat __________ times. Complete this exercise __________ times per day.   STRENGTH - Shoulder Abductors, Isometric   With good posture, stand or sit about 4-6 inches from a wall with your right / left side facing the wall.   Bend your right / left elbow. Gently press your right / left elbow into the wall. Increase the pressure gradually until you are pressing as hard as you can without shrugging your shoulder or increasing any shoulder discomfort.   Hold __________ seconds.   Release the tension slowly. Relax your shoulder muscles completely before you do the next repetition.  Repeat __________ times. Complete this exercise __________ times per day.   STRENGTH - Internal Rotators, Isometric   Keep your right / left elbow at your side and bend it 90 degrees.   Step into a door frame so that the inside of your right / left wrist can press against the door frame without your upper arm leaving your side.   Gently press your right / left wrist into the door frame as if you were trying to draw the palm of your hand to your abdomen. Gradually increase the tension until you are pressing as hard as you can without shrugging your shoulder or increasing any shoulder discomfort.   Hold __________ seconds.   Release the tension slowly. Relax your shoulder muscles completely before you do the next repetition.  Repeat __________ times. Complete this exercise __________ times per day.   STRENGTH - External  Rotators, Isometric   Keep  your right / left elbow at your side and bend it 90 degrees.   Step into a door frame so that the outside of your right / left wrist can press against the door frame without your upper arm leaving your side.   Gently press your right / left wrist into the door frame as if you were trying to swing the back of your hand away from your abdomen. Gradually increase the tension until you are pressing as hard as you can without shrugging your shoulder or increasing any shoulder discomfort.   Hold __________ seconds.   Release the tension slowly. Relax your shoulder muscles completely before you do the next repetition.  Repeat __________ times. Complete this exercise __________ times per day.   Document Released: 10/12/2005 Document Revised: 10/01/2011 Document Reviewed: 01/24/2009 [facility] Patient Information 2012 [place], [place].

## 2012-08-02 NOTE — Progress Notes (Signed)
  Subjective:    Patient ID: [name], female    DOB: [birth year], 46 y.o.   MRN: [id number]  HPI The PT is here for follow up and re-evaluation of chronic medical conditions, medication management and review of any available recent lab and radiology data.  Preventive health is updated, specifically  Cancer screening and Immunization.   Questions or concerns regarding consultations or procedures which the PT has had in the interim are  Addressed.continues to have left breast mass and will need surgical evaluation and treatment The PT denies any adverse reactions to current medications since the last visit.  There are no new concerns.  There are no specific complaints       Review of Systems See HPI Denies recent fever or chills. Denies sinus pressure, nasal congestion, ear pain or sore throat. Denies chest congestion, productive cough or wheezing. Denies chest pains, palpitations and leg swelling Denies abdominal pain, nausea, vomiting,diarrhea or constipation.   Denies dysuria, frequency, hesitancy or incontinence. Chronic  joint pain,  and limitation in mobility. Denies headaches, seizures, numbness, or tingling. Denies depression,uncontrolled  anxiety or insomnia.Seems to be in a better situation, now has transport to medical appointments Denies skin break down or rash.        Objective:   Physical Exam  Patient alert and oriented and in no cardiopulmonary distress.  HEENT: No facial asymmetry, EOMI, no sinus tenderness,  oropharynx pink and moist.  Neck supple no adenopathy.  Chest: Clear to auscultation bilaterally.Decreased air entry bilaterally Breast: right breast no mass or nipple inversion. Left breast firm sub areolar mass, dia approx 4 cm in area where she has been aspirated and treated with antibiotics on 2 previous occasions CVS: S1, S2 no murmurs, no S3.  ABD: Soft non tender. Bowel sounds normal.  Ext: No edema  MS: Adequate ROM spine, shoulders,  hips and knees.  Skin: Intact, no ulcerations or rash noted.  Psych: Good eye contact, normal affect. Memory intact not anxious or depressed appearing.  CNS: CN 2-12 intact, power, tone and sensation normal throughout.       Assessment & Plan:

## 2012-08-02 NOTE — Assessment & Plan Note (Signed)
Persistent and recurrent despite multiple aspirations as well as treatment with antibiotics, refer for surgical removal

## 2012-08-02 NOTE — Assessment & Plan Note (Signed)
Improved though still uncontrolled Dose increase on norvasc DASH diet and commitment to daily physical activity for a minimum of 30 minutes discussed and encouraged, as a part of hypertension management. The importance of attaining a healthy weight is also discussed.

## 2012-08-02 NOTE — Assessment & Plan Note (Signed)
Uncontrolled and followed by endo 

## 2012-08-02 NOTE — Assessment & Plan Note (Signed)
Improved continue med as before

## 2012-08-02 NOTE — Assessment & Plan Note (Signed)
Uncontrolled, updated lab needed Hyperlipidemia:Low fat diet discussed and encouraged.   

## 2012-08-25 ENCOUNTER — Other Ambulatory Visit: Payer: Self-pay | Admitting: Orthopedic Surgery

## 2012-08-25 DIAGNOSIS — R52 Pain, unspecified: Secondary | ICD-10-CM

## 2012-08-25 MED ORDER — HYDROCODONE-ACETAMINOPHEN 5-325 MG PO TABS: 1.0000 | ORAL_TABLET | Freq: Four times a day (QID) | ORAL | Status: DC | PRN

## 2012-08-25 NOTE — Addendum Note (Signed)
Addended by: Fuller Canada E on: 08/25/2012 08:00 PM   Modules accepted: Orders

## 2012-08-26 ENCOUNTER — Other Ambulatory Visit: Payer: Self-pay | Admitting: Orthopedic Surgery

## 2012-08-26 NOTE — Telephone Encounter (Signed)
Done. FAXED 08/26/12.

## 2012-08-26 NOTE — Telephone Encounter (Signed)
Fax to [facility] on back [address]

## 2012-09-26 ENCOUNTER — Other Ambulatory Visit: Payer: Self-pay | Admitting: Family Medicine

## 2012-09-26 ENCOUNTER — Telehealth: Payer: Self-pay | Admitting: Family Medicine

## 2012-09-26 DIAGNOSIS — M25519 Pain in unspecified shoulder: Secondary | ICD-10-CM

## 2012-09-26 NOTE — Telephone Encounter (Signed)
Please refer to Dr Romeo Apple order entered

## 2012-09-27 ENCOUNTER — Other Ambulatory Visit: Payer: Self-pay | Admitting: Family Medicine

## 2012-09-27 NOTE — Telephone Encounter (Signed)
Pt has been referred to [facility]. Left message for pt about them calling her

## 2012-10-03 ENCOUNTER — Telehealth: Payer: Self-pay | Admitting: Orthopedic Surgery

## 2012-10-03 ENCOUNTER — Other Ambulatory Visit: Payer: Self-pay | Admitting: Orthopedic Surgery

## 2012-10-03 DIAGNOSIS — M25519 Pain in unspecified shoulder: Secondary | ICD-10-CM

## 2012-10-03 MED ORDER — HYDROCODONE-ACETAMINOPHEN 5-325 MG PO TABS: 1.0000 | ORAL_TABLET | ORAL | Status: DC | PRN

## 2012-10-03 NOTE — Telephone Encounter (Signed)
Patient aware.

## 2012-10-03 NOTE — Telephone Encounter (Signed)
Dr. [name] has referred [name] back to you for right shoulder pain.  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 Says she plans to go to the therapy once reinstated with transit service.  Please advise if ok to schedule her back for this shoulder since she has not had therapy yet. She says she is in a lot of pain and needs more pain medicine.

## 2012-10-03 NOTE — Telephone Encounter (Signed)
GO TO THERAPY FIRST

## 2012-10-03 NOTE — Telephone Encounter (Signed)
[name] never went to the therapy Dr. Romeo Apple ordered for her shoulder.  Dr. Romeo Apple said she will have to go to the therapy before we schedule her back here for her shoulder.  [name] is aware of this.

## 2012-10-17 ENCOUNTER — Telehealth: Payer: Self-pay | Admitting: Family Medicine

## 2012-10-17 MED ORDER — ALPRAZOLAM 0.5 MG PO TABS: ORAL_TABLET | ORAL | Status: DC

## 2012-10-17 NOTE — Telephone Encounter (Signed)
Printed to be signed.  

## 2012-10-19 ENCOUNTER — Encounter (HOSPITAL_COMMUNITY): Payer: Self-pay

## 2012-10-19 ENCOUNTER — Emergency Department (HOSPITAL_COMMUNITY)
Admission: EM | Admit: 2012-10-19 | Discharge: 2012-10-19 | Disposition: A | Discharge status: Home / Self Care | Payer: PRIVATE HEALTH INSURANCE | Attending: Emergency Medicine | Admitting: Emergency Medicine

## 2012-10-19 DIAGNOSIS — T148XXA Other injury of unspecified body region, initial encounter: Secondary | ICD-10-CM

## 2012-10-19 DIAGNOSIS — E119 Type 2 diabetes mellitus without complications: Secondary | ICD-10-CM | POA: Insufficient documentation

## 2012-10-19 DIAGNOSIS — I10 Essential (primary) hypertension: Secondary | ICD-10-CM | POA: Insufficient documentation

## 2012-10-19 DIAGNOSIS — E669 Obesity, unspecified: Secondary | ICD-10-CM | POA: Insufficient documentation

## 2012-10-19 DIAGNOSIS — S41109A Unspecified open wound of unspecified upper arm, initial encounter: Secondary | ICD-10-CM | POA: Insufficient documentation

## 2012-10-19 DIAGNOSIS — F1010 Alcohol abuse, uncomplicated: Secondary | ICD-10-CM | POA: Insufficient documentation

## 2012-10-19 DIAGNOSIS — F17200 Nicotine dependence, unspecified, uncomplicated: Secondary | ICD-10-CM | POA: Insufficient documentation

## 2012-10-19 DIAGNOSIS — E785 Hyperlipidemia, unspecified: Secondary | ICD-10-CM | POA: Insufficient documentation

## 2012-10-19 DIAGNOSIS — Z79899 Other long term (current) drug therapy: Secondary | ICD-10-CM | POA: Insufficient documentation

## 2012-10-19 DIAGNOSIS — F411 Generalized anxiety disorder: Secondary | ICD-10-CM | POA: Insufficient documentation

## 2012-10-19 DIAGNOSIS — F410 Panic disorder [episodic paroxysmal anxiety] without agoraphobia: Secondary | ICD-10-CM | POA: Insufficient documentation

## 2012-10-19 DIAGNOSIS — E7800 Pure hypercholesterolemia, unspecified: Secondary | ICD-10-CM | POA: Insufficient documentation

## 2012-10-19 MED ORDER — IBUPROFEN 800 MG PO TABS
800.0000 mg | ORAL_TABLET | Freq: Once | ORAL | Status: AC
  Administered 2012-10-19: 800 mg via ORAL
  Filled 2012-10-19: qty 1

## 2012-10-19 MED ORDER — HYDROCODONE-ACETAMINOPHEN 5-325 MG PO TABS
1.0000 | ORAL_TABLET | Freq: Once | ORAL | Status: AC
  Administered 2012-10-19: 1 via ORAL
  Filled 2012-10-19: qty 1

## 2012-10-19 MED ORDER — BACITRACIN ZINC 500 UNIT/GM EX OINT
TOPICAL_OINTMENT | Freq: Once | CUTANEOUS | Status: AC
  Administered 2012-10-19: 06:00:00 via TOPICAL
  Filled 2012-10-19: qty 0.9

## 2012-10-19 MED ORDER — IBUPROFEN 800 MG PO TABS: 800.0000 mg | ORAL_TABLET | Freq: Three times a day (TID) | ORAL | Status: DC
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
## 2012-10-27 ENCOUNTER — Other Ambulatory Visit: Payer: Self-pay | Admitting: Family Medicine

## 2012-10-28 ENCOUNTER — Telehealth: Payer: Self-pay | Admitting: Family Medicine

## 2012-10-28 NOTE — Telephone Encounter (Signed)
Called pt back and no answer. Voicemail full and cannot accept any messages

## 2012-10-29 IMAGING — CR DG CHEST 2V
2 series · 2 of 2 positions shown · non-contrast
Comparison: 11/11/2009

CLINICAL DATA: Cough, hyperglycemia

CHEST - 2 VIEW

[view not recorded (1 of 2)]
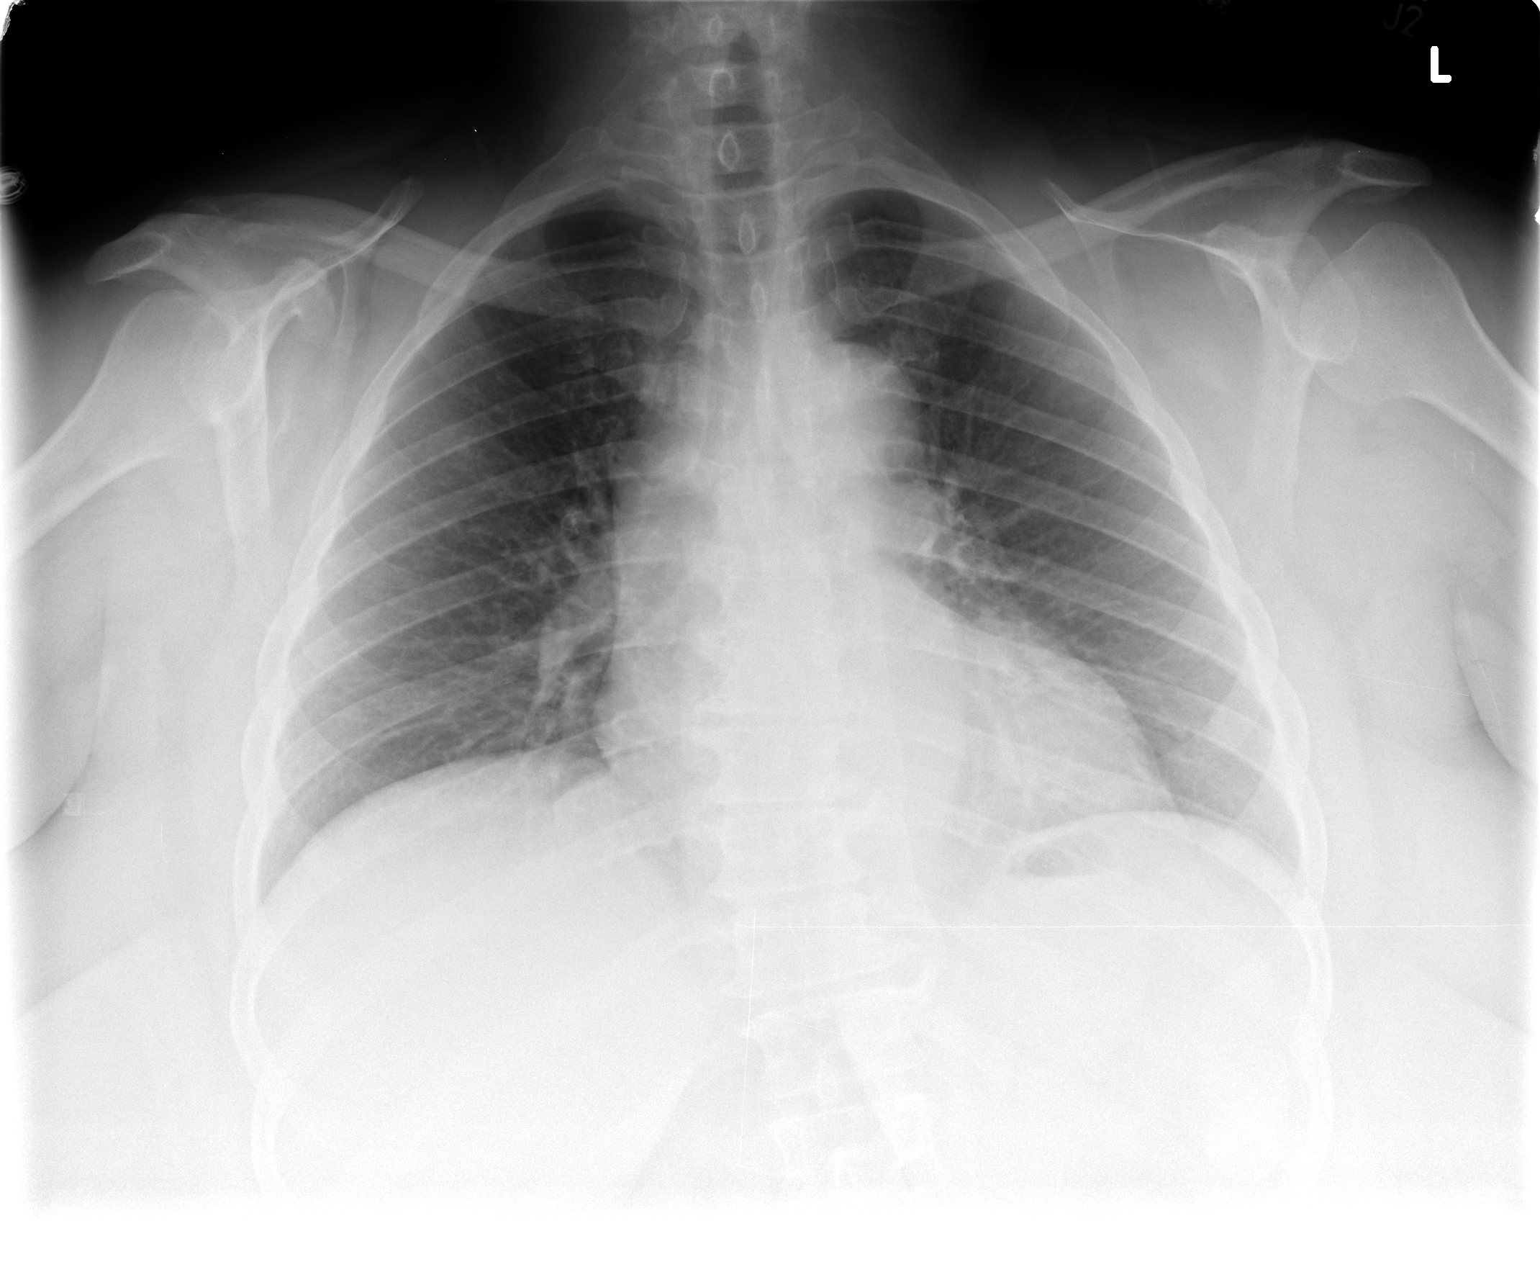

[view not recorded (2 of 2)]
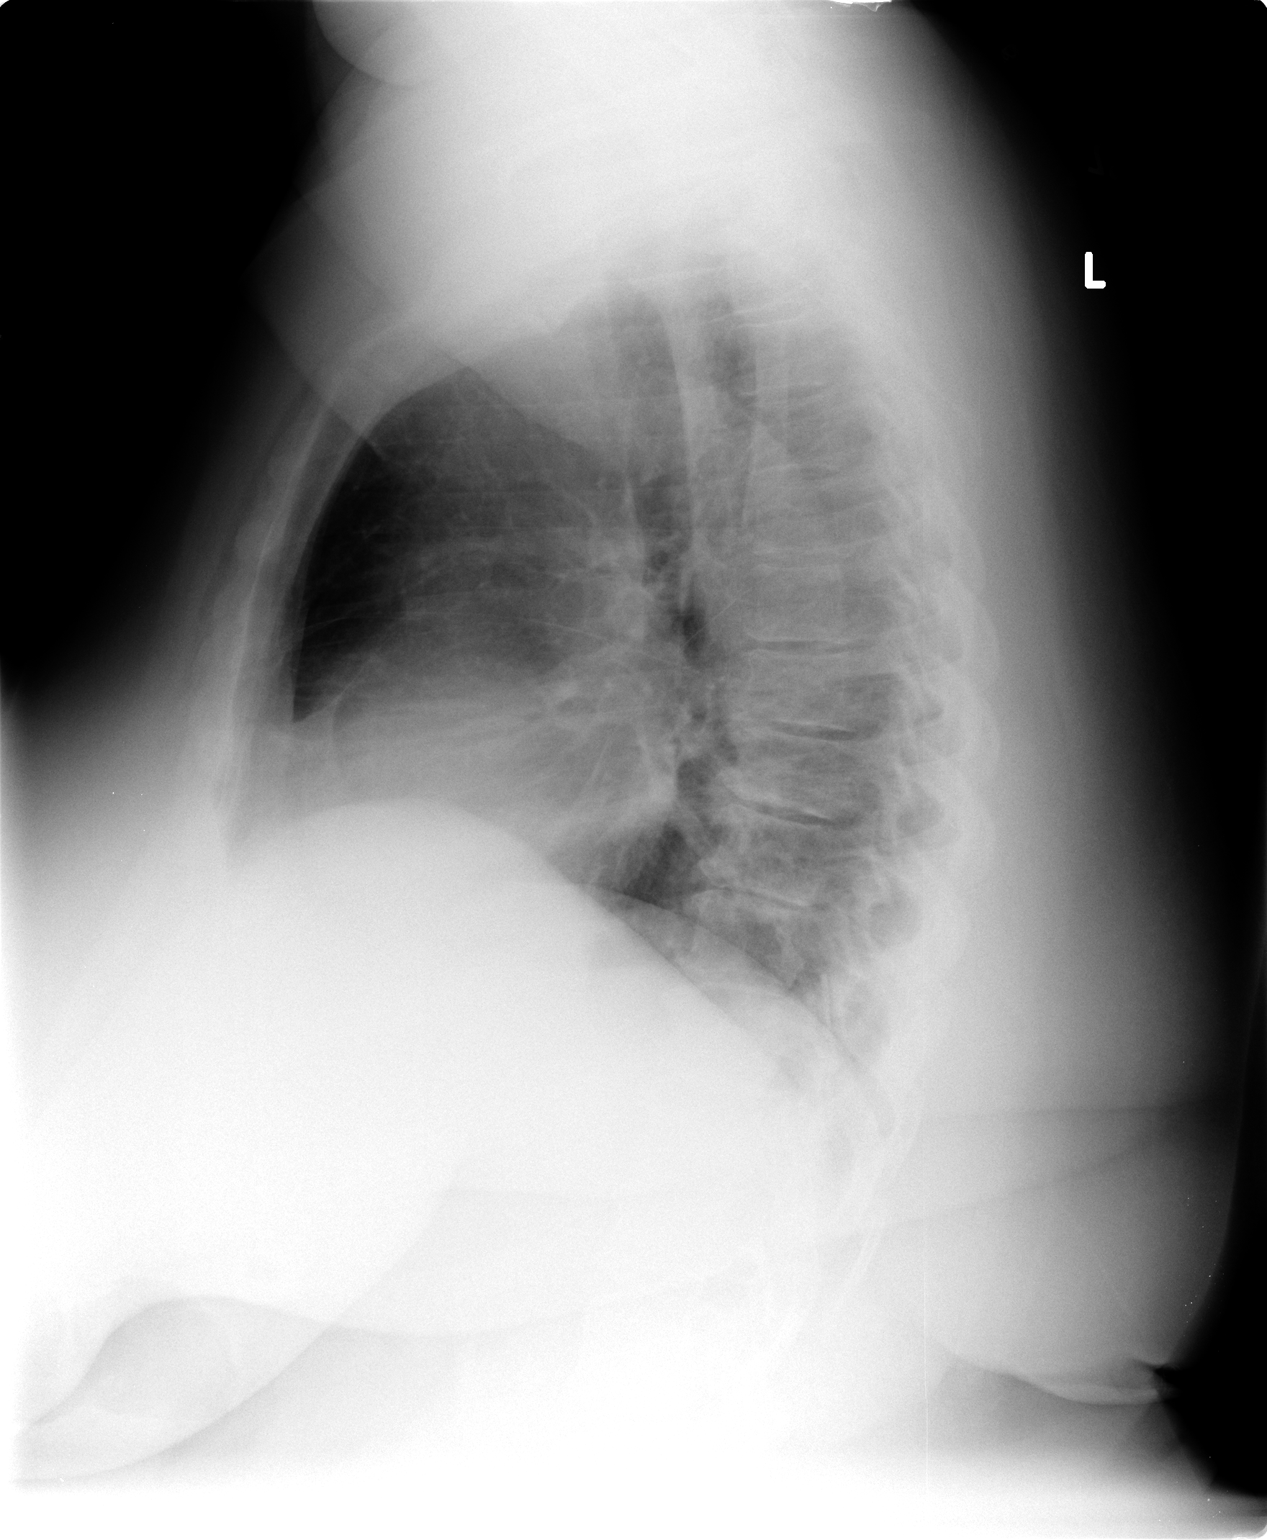

[2 of 2 positions shown; findings below may reference images not displayed]

FINDINGS: Heart size is mildly enlarged.  Negative for heart
failure.  Negative for pneumonia or mass lesion.  Lungs are clear.
IMPRESSION: No active cardiopulmonary disease.

## 2012-10-31 ENCOUNTER — Telehealth: Payer: Self-pay | Admitting: Orthopedic Surgery

## 2012-10-31 ENCOUNTER — Ambulatory Visit (HOSPITAL_COMMUNITY)
Admission: RE | Admit: 2012-10-31 | Discharge: 2012-10-31 | Disposition: A | Discharge status: Home / Self Care | Payer: PRIVATE HEALTH INSURANCE | Source: Ambulatory Visit | Attending: Family Medicine | Admitting: Family Medicine

## 2012-10-31 DIAGNOSIS — M25511 Pain in right shoulder: Secondary | ICD-10-CM

## 2012-10-31 DIAGNOSIS — I10 Essential (primary) hypertension: Secondary | ICD-10-CM | POA: Insufficient documentation

## 2012-10-31 DIAGNOSIS — M75101 Unspecified rotator cuff tear or rupture of right shoulder, not specified as traumatic: Secondary | ICD-10-CM | POA: Insufficient documentation

## 2012-10-31 DIAGNOSIS — M25619 Stiffness of unspecified shoulder, not elsewhere classified: Secondary | ICD-10-CM | POA: Insufficient documentation

## 2012-10-31 DIAGNOSIS — R29898 Other symptoms and signs involving the musculoskeletal system: Secondary | ICD-10-CM | POA: Insufficient documentation

## 2012-10-31 DIAGNOSIS — IMO0001 Reserved for inherently not codable concepts without codable children: Secondary | ICD-10-CM | POA: Insufficient documentation

## 2012-10-31 DIAGNOSIS — M6281 Muscle weakness (generalized): Secondary | ICD-10-CM | POA: Insufficient documentation

## 2012-10-31 DIAGNOSIS — E119 Type 2 diabetes mellitus without complications: Secondary | ICD-10-CM | POA: Insufficient documentation

## 2012-10-31 DIAGNOSIS — M25519 Pain in unspecified shoulder: Secondary | ICD-10-CM | POA: Insufficient documentation

## 2012-10-31 NOTE — Telephone Encounter (Signed)
Rehab needs an updated OT order for [facility]  She did not go in September when order was entered, due to no transportation. She is now going but needs updated order.

## 2012-10-31 NOTE — Evaluation (Signed)
Occupational Therapy Evaluation  Patient Details  Name: [name] MRN: [id number] Date of Birth: [birth year]  Today's Date: 10/31/2012 Time: 1478-2956 OT Time Calculation (min): 35 min OT Evaluation 1310-1325 15' Manual Therapy 1325-1345 20'  Visit#: 1  of 12   Re-eval: 11/28/12  Assessment Diagnosis: Right Rotator Cuff Tear Next MD Visit: unscheduled Prior Therapy: none  Authorization: [employer] Medicare and Medicaid  Authorization Time Period: 3 visits for medicaid secondary and before 10 th visit for Medicare  Authorization Visit#: 1  of 10    Past Medical History:  Past Medical History  Diagnosis Date  . Alcohol use   . Diabetes mellitus, type 2   . Obesity   . Hyperlipidemia   . Hypertension   . Anxiety   . Panic attacks   . Hypercholesteremia    Past Surgical History:  Past Surgical History  Procedure Date  . Dilation and curettage of uterus     Subjective S:  I have so much pain in my right shoulder, I can't deal with it. Pertinent History: Patient states that she has been experiencing pain and decreased function in her right shoulder for several years.  She was referred to Dr. Romeo Apple by Dr. [name] and had an MRI which detected full thickness tears of the right supraspinatus, subscapularis, and a partial tear of the infraspinatus.  Dr. Romeo Apple referred her to occupational therapy in the Fall of 2013 but she could not attend due to transportation issues.  Dr. Romeo Apple has encouraged her to complete therapy prior to returning to his office. Special Tests: UEFI scored 3/80 = 3% I level. Patient Stated Goals: I want to be able to take care of myself. Pain Assessment Currently in Pain?: Yes Pain Score:   9 Pain Location: Shoulder Pain Orientation: Right;Lateral;Upper Pain Type: Acute pain  Precautions/Restrictions  Precautions Precautions: None  Prior Functioning  Home Living Lives With: Alone Additional Comments: has an aide 2 hours per  day that assists with BADLs. Prior Function Level of Independence: Independent with basic ADLs Driving: No Vocation: On disability Leisure: Hobbies-yes (Comment) Comments: enjoys spending time with her grandchildren  Assessment ADL/Vision/Perception ADL ADL Comments: Is unable to use her RUE with any functional activities.  She states that combing her hair, lifting, and opening containers are the most difficult activities Dominant Hand: Right  Cognition/Observation Cognition Overall Cognitive Status: Appears within functional limits for tasks assessed  Sensation/Coordination/Edema Sensation Light Touch: Appears Intact Coordination Gross Motor Movements are Fluid and Coordinated: Yes Fine Motor Movements are Fluid and Coordinated: Yes  Additional Assessments RUE AROM (degrees) RUE Overall AROM Comments: assessed in seated ER/IR with shoulder adducted Right Shoulder Flexion: 96 Degrees Right Shoulder ABduction: 90 Degrees Right Shoulder Internal Rotation: 75 Degrees Right Shoulder External Rotation: 44 Degrees RUE PROM (degrees) RUE Overall PROM Comments: assessed in seated 75%  RUE Strength RUE Overall Strength Comments: assessed in seated is 4-/5 in given range Palpation Palpation: Moderate fascial restrictions in periscapular region and upper arm     Exercise/Treatments    Manual Therapy Manual Therapy: Myofascial release Myofascial Release: MFR and manual stretching in seated position to decrease pain and fascial restrictions and increase pain free mobility in RUE.  Occupational Therapy Assessment and Plan OT Assessment and Plan Clinical Impression Statement: A:  Patient is a 47 year old female with  a known rotator cuff tear in her right shoulder.  She has deficits that include increased pain and restrictions and decreased AROM and strength in her  right shoulder which is causing decreased use of her RUE with functional activities.  Pt will benefit from skilled  therapeutic intervention in order to improve on the following deficits: Decreased range of motion;Increased fascial restricitons;Pain;Increased muscle spasms;Decreased strength Rehab Potential: Good OT Frequency: Min 2X/week OT Duration: 6 weeks OT Treatment/Interventions: Self-care/ADL training;Therapeutic exercise;Therapeutic activities;Manual therapy;Patient/family education;Modalities OT Plan: P:  SKilled OT intervention to decrease pain and restrictions and increase pain free AROM and strength in right shoulder in order to use her RUE as dominant with her daily activities.  Treatment Plan:  MFR and manual stretching to right shoulder region.  PROM and AAROM in supine.  isometric strengthening in supine.  Seated elev, ext, ret, row.  ball stretches, progress as tolerated.    Goals Short Term Goals Time to Complete Short Term Goals: 3 weeks Short Term Goal 1: Patient will be educated on HEP. Short Term Goal 2: Patient will increase PROM in right shoulder to [facility] for increased ability to don and doff shirts. Short Term Goal 3: Patient will increase periscapular shoulder strength to 4/5 for increased ability to reach overhead. Short Term Goal 4: Patient will decrease pain in her right shoulder to 7/10 when she is attempting to comb her hair. Short Term Goal 5: Patient will decrease fascial restrictions to min-mod in her right shoulder region for less pain with functional activities. Long Term Goals Time to Complete Long Term Goals: 6 weeks Long Term Goal 1: Patient will improve her functional level of I and increase use of her RUE to at least 50% with daily activities as demonstrated by improving her score to 50% or better on the UEFI. Long Term Goal 2: Patient will increase her right shoulder AROM to 75% for increased ability to comb her own hair. Long Term Goal 3: Patient will increase her right shoulder strength to 4+/5 in given range for increased ability to lift cooking pots and pans. Long  Term Goal 4: Patient will decrease pain to 5/10 or better in her right shoulder when completing grooming activities.  Long Term Goal 5: Patient will decrease fascial restrictions to minimal in her right shoulder for increased ability to complete dressing activities.   Problem List Patient Active Problem List  Diagnosis  . DIABETES MELLITUS, TYPE II  . HYPERLIPIDEMIA  . OBESITY  . ANXIETY  . ALCOHOL USE  . DEPRESSION  . HYPERTENSION  . SHOULDER PAIN, RIGHT  . BACK PAIN  . BURSITIS, RIGHT SHOULDER  . INSOMNIA  . FATIGUE  . RECTAL BLEEDING, HX OF  . NICOTINE ADDICTION  . Breast mass, left  . Vitamin d deficiency  . Diabetic Charcot foot  . Ankle fracture, lateral malleolus, closed  . Medical non-compliance  . Closed left ankle fracture  . Foot pain  . Leg ulcer, left  . Shoulder pain, right  . Poor mobility  . Muscle weakness (generalized)  . Rotator cuff syndrome of right shoulder    End of Session Activity Tolerance: Patient tolerated treatment well General Behavior During Session: [facility] for tasks performed Cognition: [facility] for tasks performed OT Plan of Care OT Home Exercise Plan: Educated on towel slides this date.   GO Functional Assessment Tool Used: UEFI scored 3/80 = 3% Functional Limitation: Self care  Self Care Current Status (Z6109): At least 80 percent but less than 100 percent impaired, limited or restricted Self Care Goal Status (U0454): At least 20 percent but less than 40 percent impaired, limited or restricted   [name], OTR/L  10/31/2012, 4:44 PM  Physician Documentation Your signature is required to indicate approval of the treatment plan as stated above.  Please sign and either send electronically or make a copy of this report for your files and return this physician signed original.  Please mark one 1.__approve of plan  2. ___approve of plan with the following conditions.   ______________________________                                                           _____________________ Physician Signature                                                                                                             Date

## 2012-10-31 NOTE — Telephone Encounter (Signed)
Faxed order to PT at [facility]

## 2012-11-01 ENCOUNTER — Encounter: Payer: Self-pay | Admitting: Family Medicine

## 2012-11-01 ENCOUNTER — Ambulatory Visit (INDEPENDENT_AMBULATORY_CARE_PROVIDER_SITE_OTHER): Payer: PRIVATE HEALTH INSURANCE | Admitting: Family Medicine

## 2012-11-01 ENCOUNTER — Other Ambulatory Visit (HOSPITAL_COMMUNITY)
Admission: RE | Admit: 2012-11-01 | Discharge: 2012-11-01 | Disposition: A | Discharge status: Home / Self Care | Payer: PRIVATE HEALTH INSURANCE | Source: Ambulatory Visit | Attending: Family Medicine | Admitting: Family Medicine

## 2012-11-01 VITALS — BP 240/140 | HR 88 | Resp 16 | Ht 62.0 in | Wt 201.0 lb

## 2012-11-01 DIAGNOSIS — Z0000 Encounter for general adult medical examination without abnormal findings: Secondary | ICD-10-CM

## 2012-11-01 DIAGNOSIS — I10 Essential (primary) hypertension: Secondary | ICD-10-CM

## 2012-11-01 DIAGNOSIS — Z1151 Encounter for screening for human papillomavirus (HPV): Secondary | ICD-10-CM | POA: Insufficient documentation

## 2012-11-01 DIAGNOSIS — F329 Major depressive disorder, single episode, unspecified: Secondary | ICD-10-CM

## 2012-11-01 DIAGNOSIS — Z124 Encounter for screening for malignant neoplasm of cervix: Secondary | ICD-10-CM

## 2012-11-01 DIAGNOSIS — R5381 Other malaise: Secondary | ICD-10-CM

## 2012-11-01 DIAGNOSIS — N6320 Unspecified lump in the left breast, unspecified quadrant: Secondary | ICD-10-CM

## 2012-11-01 DIAGNOSIS — E119 Type 2 diabetes mellitus without complications: Secondary | ICD-10-CM

## 2012-11-01 DIAGNOSIS — F3289 Other specified depressive episodes: Secondary | ICD-10-CM

## 2012-11-01 DIAGNOSIS — Z01419 Encounter for gynecological examination (general) (routine) without abnormal findings: Secondary | ICD-10-CM | POA: Insufficient documentation

## 2012-11-01 DIAGNOSIS — E785 Hyperlipidemia, unspecified: Secondary | ICD-10-CM

## 2012-11-01 DIAGNOSIS — F17200 Nicotine dependence, unspecified, uncomplicated: Secondary | ICD-10-CM

## 2012-11-01 DIAGNOSIS — N63 Unspecified lump in unspecified breast: Secondary | ICD-10-CM

## 2012-11-01 MED ORDER — ALPRAZOLAM 0.5 MG PO TABS: ORAL_TABLET | ORAL | Status: DC

## 2012-11-01 MED ORDER — FUROSEMIDE 40 MG PO TABS: ORAL_TABLET | ORAL | Status: DC

## 2012-11-01 NOTE — Progress Notes (Signed)
  Subjective:    Patient ID: [name], female    DOB: 15-Aug-1966, 47 y.o.   MRN: [id number]  HPI Pt in for pelvic and breast exams. Her blood pressure is as usual uncontrolled, she states she has no money to buy medication, she is "just tired" Not actively suicidal r homicidal. Overwhelmed with the challenges of life , no money to ever buty her meds, still smokes , unwilling to comiting to quit Blood sugars are not being tested, she is non compliant with meds and is not keeeping appointments with endo   Review of Systems See HPI Denies recent fever or chills. Denies sinus pressure, nasal congestion, ear pain or sore throat. Denies chest congestion, productive cough or wheezing. Denies chest pains, palpitations and leg swelling Denies abdominal pain, nausea, vomiting,diarrhea or constipation.   Denies dysuria, frequency, hesitancy or incontinence. Denies joint pain, swelling and limitation in mobility. Denies headaches, seizures, numbness, or tingling. Denies depression, anxiety or insomnia. Denies skin break down or rash.        Objective:   Physical Exam Pleasant well nourished female, alert and oriented x 3 Afebrile. HEENT No facial trauma or asymetry. Sinuses non tender.  EOMI, PERTL, fundoscopic exam i no hemorhage or exudate.  External ears normal, tympanic membranes clear. Oropharynx moist, no exudate, poor dentition. Neck: supple, no adenopathy,JVD or thyromegaly.No bruits.  Chest: Clear to ascultation bilaterally.No crackles or wheezes.Decreased air entry throughout Non tender to palpation  Breast: No asymetry,no masses. No nipple discharge or inversion. No axillary or supraclavicular adenopathy  Cardiovascular system; Heart sounds normal,  S1 and  S2 ,no S3.  No murmur, or thrill. Apical beat not displaced Peripheral pulses normal.  Abdomen: Soft, non tender, no organomegaly or masses. No bruits. Bowel sounds normal. No guarding, tenderness or  rebound.  Rectal:  Deferred , pt refused , will return stool cards  GU: External genitalia normal. No lesions. Vaginal canal normal.No discharge. Uterus normal size, no adnexal masses, no cervical motion or adnexal tenderness.  Musculoskeletal exam: Full ROM of spine, hips , shoulders and knees. No deformity ,swelling or crepitus noted. No muscle wasting or atrophy.   Neurologic: Cranial nerves 2 to 12 intact. Power, tone ,sensation and reflexes normal throughout. No disturbance in gait. No tremor.  Skin: Intact, no ulceration, erythema , scaling or rash noted. Pigmentation normal throughout  Psych; Depressed, not suicidal or homicidal no hallucinationsl        Assessment & Plan:

## 2012-11-01 NOTE — Patient Instructions (Addendum)
F/u in 5 weeks   You need to take the THREE blood pressure medications as prescribed, also the one  medication cholesterol  You are being referred for re evaluation of the left breast   You are being referred to mental health you are severely depressed, you need help. You have to stop using your depression as an excuse for not taking care of your health  You need to return 3 stool cards for colon screening   Labs today HBA1C, cmp and EGFR, TSH  Your blood pressure is consitently DANGEROUSLY high , as has been discussed multiple times. If you continue to be non compliant with medication , you will have  Worsening of your health,   You need to stop smoking, this also along with high blood pressure, increases your stroke risk

## 2012-11-01 NOTE — Assessment & Plan Note (Signed)

## 2012-11-02 ENCOUNTER — Telehealth: Payer: Self-pay | Admitting: Family Medicine

## 2012-11-02 ENCOUNTER — Telehealth: Payer: Self-pay | Admitting: Orthopedic Surgery

## 2012-11-02 ENCOUNTER — Encounter: Payer: Self-pay | Admitting: Family Medicine

## 2012-11-02 DIAGNOSIS — E785 Hyperlipidemia, unspecified: Secondary | ICD-10-CM

## 2012-11-02 DIAGNOSIS — I10 Essential (primary) hypertension: Secondary | ICD-10-CM

## 2012-11-02 LAB — COMPLETE METABOLIC PANEL WITH GFR
ALT: 12 U/L (ref 0–35)
AST: 14 U/L (ref 0–37)
Albumin: 3.5 g/dL (ref 3.5–5.2)
Alkaline Phosphatase: 85 U/L (ref 39–117)
BUN: 11 mg/dL (ref 6–23)
CO2: 25 mEq/L (ref 19–32)
Calcium: 9.3 mg/dL (ref 8.4–10.5)
Chloride: 104 mEq/L (ref 96–112)
Creat: 0.84 mg/dL (ref 0.50–1.10)
GFR, Est African American: >89 mL/min
GFR, Est Non African American: 82 mL/min
Glucose, Bld: 189 mg/dL — ABNORMAL HIGH (ref 70–99)
Potassium: 3.6 mEq/L (ref 3.5–5.3)
Sodium: 141 mEq/L (ref 135–145)
Total Bilirubin: 0.7 mg/dL (ref 0.3–1.2)
Total Protein: 6.2 g/dL (ref 6.0–8.3)

## 2012-11-02 LAB — TSH: TSH: 0.846 u[IU]/mL (ref 0.350–4.500)

## 2012-11-02 LAB — HEMOGLOBIN A1C
Hgb A1c MFr Bld: 8.7 % — ABNORMAL HIGH (ref <5.7)
Mean Plasma Glucose: 203 mg/dL — ABNORMAL HIGH (ref <117)

## 2012-11-02 MED ORDER — AMLODIPINE BESYLATE 10 MG PO TABS: 10.0000 mg | ORAL_TABLET | Freq: Every day | ORAL | Status: DC

## 2012-11-02 MED ORDER — BENAZEPRIL-HYDROCHLOROTHIAZIDE 20-12.5 MG PO TABS: ORAL_TABLET | ORAL | Status: DC

## 2012-11-02 MED ORDER — CLONIDINE HCL 0.3 MG PO TABS: ORAL_TABLET | ORAL | Status: DC

## 2012-11-02 MED ORDER — METFORMIN HCL 1000 MG PO TABS: 1000.0000 mg | ORAL_TABLET | Freq: Two times a day (BID) | ORAL | Status: DC

## 2012-11-02 MED ORDER — PRAVASTATIN SODIUM 40 MG PO TABS: 40.0000 mg | ORAL_TABLET | Freq: Every evening | ORAL | Status: DC

## 2012-11-02 MED ORDER — FLUOXETINE HCL 10 MG PO CAPS: 10.0000 mg | ORAL_CAPSULE | Freq: Every day | ORAL | Status: DC

## 2012-11-02 NOTE — Telephone Encounter (Signed)
noted 

## 2012-11-02 NOTE — Telephone Encounter (Signed)
She has been referred to dr Jenkis/ziggler in the past pls set up appt again

## 2012-11-02 NOTE — Telephone Encounter (Signed)
Patient is aware 

## 2012-11-02 NOTE — Telephone Encounter (Signed)
Pt called and stated she did not have enough money to get her meds ($8) I advised her to JUST get the BP meds which would be $3 but she still could not afford them and she had a headache so I sent her on to the ED

## 2012-11-02 NOTE — Telephone Encounter (Signed)
Called patient and told her that Dr. Romeo Apple was not going to refill pain medicine. Advised her to try ice and OTC meds for pain. Patient stated she was going to find another doctor and hung the phone up on me.

## 2012-11-02 NOTE — Progress Notes (Signed)
PATIENT AWARE

## 2012-11-02 NOTE — Telephone Encounter (Signed)
Resent in

## 2012-11-02 NOTE — Telephone Encounter (Signed)
Patient has called back twice today following refill request, Norco 5-325mg  showing to be declined.  States she has made her physical therapy appointments and that the therapy causes discomfort, and therefore, said she needs the medication.  Ph# [phone]

## 2012-11-03 ENCOUNTER — Telehealth: Payer: Self-pay | Admitting: Family Medicine

## 2012-11-03 NOTE — Telephone Encounter (Signed)
Patient is aware 

## 2012-11-07 ENCOUNTER — Ambulatory Visit (HOSPITAL_COMMUNITY): Payer: PRIVATE HEALTH INSURANCE | Admitting: Occupational Therapy

## 2012-11-07 ENCOUNTER — Emergency Department (HOSPITAL_COMMUNITY): Payer: PRIVATE HEALTH INSURANCE

## 2012-11-07 ENCOUNTER — Encounter (HOSPITAL_COMMUNITY): Payer: Self-pay

## 2012-11-07 ENCOUNTER — Telehealth: Payer: Self-pay | Admitting: Family Medicine

## 2012-11-07 ENCOUNTER — Observation Stay (HOSPITAL_BASED_OUTPATIENT_CLINIC_OR_DEPARTMENT_OTHER)
Admission: EM | Admit: 2012-11-07 | Discharge: 2012-11-08 | Disposition: A | Discharge status: Home / Self Care | Payer: PRIVATE HEALTH INSURANCE | Source: Home / Self Care | Admitting: Internal Medicine

## 2012-11-07 DIAGNOSIS — N6320 Unspecified lump in the left breast, unspecified quadrant: Secondary | ICD-10-CM

## 2012-11-07 DIAGNOSIS — S8263XA Displaced fracture of lateral malleolus of unspecified fibula, initial encounter for closed fracture: Secondary | ICD-10-CM

## 2012-11-07 DIAGNOSIS — F341 Dysthymic disorder: Secondary | ICD-10-CM | POA: Insufficient documentation

## 2012-11-07 DIAGNOSIS — F17200 Nicotine dependence, unspecified, uncomplicated: Secondary | ICD-10-CM | POA: Diagnosis present

## 2012-11-07 DIAGNOSIS — E669 Obesity, unspecified: Secondary | ICD-10-CM | POA: Insufficient documentation

## 2012-11-07 DIAGNOSIS — F329 Major depressive disorder, single episode, unspecified: Secondary | ICD-10-CM | POA: Diagnosis present

## 2012-11-07 DIAGNOSIS — J189 Pneumonia, unspecified organism: Secondary | ICD-10-CM

## 2012-11-07 DIAGNOSIS — F1010 Alcohol abuse, uncomplicated: Secondary | ICD-10-CM

## 2012-11-07 DIAGNOSIS — M79673 Pain in unspecified foot: Secondary | ICD-10-CM

## 2012-11-07 DIAGNOSIS — IMO0001 Reserved for inherently not codable concepts without codable children: Secondary | ICD-10-CM | POA: Diagnosis present

## 2012-11-07 DIAGNOSIS — E1165 Type 2 diabetes mellitus with hyperglycemia: Secondary | ICD-10-CM

## 2012-11-07 DIAGNOSIS — E785 Hyperlipidemia, unspecified: Secondary | ICD-10-CM | POA: Diagnosis present

## 2012-11-07 DIAGNOSIS — F411 Generalized anxiety disorder: Secondary | ICD-10-CM | POA: Diagnosis present

## 2012-11-07 DIAGNOSIS — Z9119 Patient's noncompliance with other medical treatment and regimen: Secondary | ICD-10-CM

## 2012-11-07 DIAGNOSIS — M719 Bursopathy, unspecified: Secondary | ICD-10-CM

## 2012-11-07 DIAGNOSIS — I119 Hypertensive heart disease without heart failure: Secondary | ICD-10-CM | POA: Insufficient documentation

## 2012-11-07 DIAGNOSIS — M67919 Unspecified disorder of synovium and tendon, unspecified shoulder: Secondary | ICD-10-CM

## 2012-11-07 DIAGNOSIS — M25519 Pain in unspecified shoulder: Secondary | ICD-10-CM

## 2012-11-07 DIAGNOSIS — J9600 Acute respiratory failure, unspecified whether with hypoxia or hypercapnia: Secondary | ICD-10-CM | POA: Diagnosis present

## 2012-11-07 DIAGNOSIS — Z23 Encounter for immunization: Secondary | ICD-10-CM

## 2012-11-07 DIAGNOSIS — R5381 Other malaise: Secondary | ICD-10-CM

## 2012-11-07 DIAGNOSIS — E11610 Type 2 diabetes mellitus with diabetic neuropathic arthropathy: Secondary | ICD-10-CM

## 2012-11-07 DIAGNOSIS — E876 Hypokalemia: Secondary | ICD-10-CM

## 2012-11-07 DIAGNOSIS — L97929 Non-pressure chronic ulcer of unspecified part of left lower leg with unspecified severity: Secondary | ICD-10-CM

## 2012-11-07 DIAGNOSIS — IMO0002 Reserved for concepts with insufficient information to code with codable children: Secondary | ICD-10-CM

## 2012-11-07 DIAGNOSIS — F3289 Other specified depressive episodes: Secondary | ICD-10-CM | POA: Diagnosis present

## 2012-11-07 DIAGNOSIS — I43 Cardiomyopathy in diseases classified elsewhere: Secondary | ICD-10-CM | POA: Insufficient documentation

## 2012-11-07 DIAGNOSIS — I10 Essential (primary) hypertension: Secondary | ICD-10-CM

## 2012-11-07 DIAGNOSIS — M549 Dorsalgia, unspecified: Secondary | ICD-10-CM

## 2012-11-07 DIAGNOSIS — M6281 Muscle weakness (generalized): Secondary | ICD-10-CM

## 2012-11-07 DIAGNOSIS — G4700 Insomnia, unspecified: Secondary | ICD-10-CM

## 2012-11-07 DIAGNOSIS — I509 Heart failure, unspecified: Secondary | ICD-10-CM

## 2012-11-07 DIAGNOSIS — I5042 Chronic combined systolic (congestive) and diastolic (congestive) heart failure: Secondary | ICD-10-CM | POA: Diagnosis present

## 2012-11-07 DIAGNOSIS — R5383 Other fatigue: Secondary | ICD-10-CM

## 2012-11-07 DIAGNOSIS — Z8719 Personal history of other diseases of the digestive system: Secondary | ICD-10-CM

## 2012-11-07 DIAGNOSIS — Z91199 Patient's noncompliance with other medical treatment and regimen due to unspecified reason: Secondary | ICD-10-CM

## 2012-11-07 DIAGNOSIS — Z7409 Other reduced mobility: Secondary | ICD-10-CM

## 2012-11-07 DIAGNOSIS — S82892A Other fracture of left lower leg, initial encounter for closed fracture: Secondary | ICD-10-CM

## 2012-11-07 DIAGNOSIS — E119 Type 2 diabetes mellitus without complications: Secondary | ICD-10-CM

## 2012-11-07 DIAGNOSIS — R0602 Shortness of breath: Secondary | ICD-10-CM | POA: Insufficient documentation

## 2012-11-07 DIAGNOSIS — J209 Acute bronchitis, unspecified: Secondary | ICD-10-CM | POA: Diagnosis present

## 2012-11-07 DIAGNOSIS — Z6835 Body mass index (BMI) 35.0-35.9, adult: Secondary | ICD-10-CM

## 2012-11-07 DIAGNOSIS — E1169 Type 2 diabetes mellitus with other specified complication: Secondary | ICD-10-CM | POA: Diagnosis present

## 2012-11-07 DIAGNOSIS — M19019 Primary osteoarthritis, unspecified shoulder: Secondary | ICD-10-CM | POA: Insufficient documentation

## 2012-11-07 DIAGNOSIS — M25511 Pain in right shoulder: Secondary | ICD-10-CM

## 2012-11-07 DIAGNOSIS — I110 Hypertensive heart disease with heart failure: Secondary | ICD-10-CM | POA: Diagnosis present

## 2012-11-07 DIAGNOSIS — M75101 Unspecified rotator cuff tear or rupture of right shoulder, not specified as traumatic: Secondary | ICD-10-CM

## 2012-11-07 HISTORY — DX: Unspecified lump in the left breast, unspecified quadrant: N63.20

## 2012-11-07 HISTORY — DX: Cardiomyopathy in diseases classified elsewhere: I43

## 2012-11-07 HISTORY — DX: Hypertensive heart disease without heart failure: I11.9

## 2012-11-07 HISTORY — DX: Tobacco use: Z72.0

## 2012-11-07 HISTORY — DX: Displaced fracture of lateral malleolus of unspecified fibula, initial encounter for closed fracture: S82.63XA

## 2012-11-07 HISTORY — DX: Type 2 diabetes mellitus with diabetic neuropathic arthropathy: E11.610

## 2012-11-07 LAB — INFLUENZA PANEL BY PCR (TYPE A & B)
H1N1 flu by pcr: NOT DETECTED
Influenza A By PCR: NEGATIVE
Influenza B By PCR: NEGATIVE

## 2012-11-07 LAB — BASIC METABOLIC PANEL
BUN: 8 mg/dL (ref 6–23)
CO2: 31 mEq/L (ref 19–32)
Calcium: 9.7 mg/dL (ref 8.4–10.5)
Chloride: 98 mEq/L (ref 96–112)
Creatinine, Ser: 0.81 mg/dL (ref 0.50–1.10)
GFR calc Af Amer: >90 mL/min (ref >90)
GFR calc non Af Amer: 85 mL/min — ABNORMAL LOW (ref >90)
Glucose, Bld: 265 mg/dL — ABNORMAL HIGH (ref 70–99)
Potassium: 3.2 mEq/L — ABNORMAL LOW (ref 3.5–5.1)
Sodium: 139 mEq/L (ref 135–145)

## 2012-11-07 LAB — PROTIME-INR
INR: 0.96 (ref 0.00–1.49)
Prothrombin Time: 12.7 seconds (ref 11.6–15.2)

## 2012-11-07 LAB — CBC WITH DIFFERENTIAL/PLATELET
Basophils Absolute: 0 10*3/uL (ref 0.0–0.1)
Basophils Relative: 1 % (ref 0–1)
Eosinophils Absolute: 0.1 10*3/uL (ref 0.0–0.7)
Eosinophils Relative: 1 % (ref 0–5)
HCT: 45.1 % (ref 36.0–46.0)
Hemoglobin: 15.8 g/dL — ABNORMAL HIGH (ref 12.0–15.0)
Lymphocytes Relative: 11 % — ABNORMAL LOW (ref 12–46)
Lymphs Abs: 0.5 10*3/uL — ABNORMAL LOW (ref 0.7–4.0)
MCH: 33.8 pg (ref 26.0–34.0)
MCHC: 35 g/dL (ref 30.0–36.0)
MCV: 96.6 fL (ref 78.0–100.0)
Monocytes Absolute: 0.3 10*3/uL (ref 0.1–1.0)
Monocytes Relative: 7 % (ref 3–12)
Neutro Abs: 4 10*3/uL (ref 1.7–7.7)
Neutrophils Relative %: 80 % — ABNORMAL HIGH (ref 43–77)
Platelets: 153 10*3/uL (ref 150–400)
RBC: 4.67 MIL/uL (ref 3.87–5.11)
RDW: 12.9 % (ref 11.5–15.5)
WBC: 4.9 10*3/uL (ref 4.0–10.5)

## 2012-11-07 LAB — TROPONIN I
Troponin I: <0.3 ng/mL (ref <0.30)
Troponin I: <0.3 ng/mL (ref <0.30)

## 2012-11-07 LAB — PRO B NATRIURETIC PEPTIDE: Pro B Natriuretic peptide (BNP): 3256 pg/mL — ABNORMAL HIGH (ref 0–125)

## 2012-11-07 LAB — GLUCOSE, CAPILLARY: Glucose-Capillary: 209 mg/dL — ABNORMAL HIGH (ref 70–99)

## 2012-11-07 LAB — MAGNESIUM: Magnesium: 1.4 mg/dL — ABNORMAL LOW (ref 1.5–2.5)

## 2012-11-07 LAB — APTT: aPTT: 29 seconds (ref 24–37)

## 2012-11-07 MED ORDER — OSELTAMIVIR PHOSPHATE 75 MG PO CAPS
75.0000 mg | ORAL_CAPSULE | Freq: Two times a day (BID) | ORAL | Status: DC
  Administered 2012-11-07: 75 mg via ORAL
  Filled 2012-11-07 (×2): qty 1

## 2012-11-07 MED ORDER — ONDANSETRON HCL 4 MG/2ML IJ SOLN: 4.0000 mg | Freq: Four times a day (QID) | INTRAMUSCULAR | Status: DC | PRN

## 2012-11-07 MED ORDER — INSULIN ASPART 100 UNIT/ML ~~LOC~~ SOLN
0.0000 [IU] | Freq: Every day | SUBCUTANEOUS | Status: DC
  Administered 2012-11-07: 2 [IU] via SUBCUTANEOUS

## 2012-11-07 MED ORDER — ENOXAPARIN SODIUM 40 MG/0.4ML ~~LOC~~ SOLN
40.0000 mg | SUBCUTANEOUS | Status: DC
  Administered 2012-11-07: 40 mg via SUBCUTANEOUS
  Filled 2012-11-07: qty 0.4

## 2012-11-07 MED ORDER — SODIUM CHLORIDE 0.9 % IJ SOLN: 3.0000 mL | INTRAMUSCULAR | Status: DC | PRN

## 2012-11-07 MED ORDER — ONDANSETRON HCL 4 MG PO TABS: 4.0000 mg | ORAL_TABLET | Freq: Four times a day (QID) | ORAL | Status: DC | PRN

## 2012-11-07 MED ORDER — ALUM & MAG HYDROXIDE-SIMETH 200-200-20 MG/5ML PO SUSP: 30.0000 mL | Freq: Four times a day (QID) | ORAL | Status: DC | PRN

## 2012-11-07 MED ORDER — POTASSIUM CHLORIDE CRYS ER 20 MEQ PO TBCR
40.0000 meq | EXTENDED_RELEASE_TABLET | Freq: Once | ORAL | Status: AC
  Administered 2012-11-07: 40 meq via ORAL
  Filled 2012-11-07: qty 2

## 2012-11-07 MED ORDER — NITROGLYCERIN 2 % TD OINT
1.0000 [in_us] | TOPICAL_OINTMENT | Freq: Once | TRANSDERMAL | Status: AC
  Administered 2012-11-07: 1 [in_us] via TOPICAL
  Filled 2012-11-07: qty 1

## 2012-11-07 MED ORDER — METFORMIN HCL 500 MG PO TABS
1000.0000 mg | ORAL_TABLET | Freq: Two times a day (BID) | ORAL | Status: DC
  Administered 2012-11-07 – 2012-11-08 (×2): 1000 mg via ORAL
  Filled 2012-11-07 (×2): qty 2

## 2012-11-07 MED ORDER — FLUOXETINE HCL 10 MG PO CAPS
10.0000 mg | ORAL_CAPSULE | Freq: Every day | ORAL | Status: DC
  Administered 2012-11-07 – 2012-11-08 (×2): 10 mg via ORAL
  Filled 2012-11-07 (×3): qty 1

## 2012-11-07 MED ORDER — INSULIN ASPART 100 UNIT/ML ~~LOC~~ SOLN
0.0000 [IU] | Freq: Three times a day (TID) | SUBCUTANEOUS | Status: DC
  Administered 2012-11-08: 5 [IU] via SUBCUTANEOUS
  Administered 2012-11-08: 3 [IU] via SUBCUTANEOUS

## 2012-11-07 MED ORDER — GUAIFENESIN-DM 100-10 MG/5ML PO SYRP
5.0000 mL | ORAL_SOLUTION | ORAL | Status: DC | PRN
  Administered 2012-11-07 – 2012-11-08 (×3): 5 mL via ORAL
  Filled 2012-11-07 (×3): qty 5

## 2012-11-07 MED ORDER — ASPIRIN 325 MG PO TABS
325.0000 mg | ORAL_TABLET | Freq: Once | ORAL | Status: AC
  Administered 2012-11-07: 325 mg via ORAL
  Filled 2012-11-07: qty 1

## 2012-11-07 MED ORDER — SIMVASTATIN 20 MG PO TABS
20.0000 mg | ORAL_TABLET | Freq: Every day | ORAL | Status: DC
  Administered 2012-11-07: 20 mg via ORAL
  Filled 2012-11-07 (×2): qty 1

## 2012-11-07 MED ORDER — HYDRALAZINE HCL 20 MG/ML IJ SOLN: 5.0000 mg | INTRAMUSCULAR | Status: DC | PRN

## 2012-11-07 MED ORDER — LEVOFLOXACIN IN D5W 750 MG/150ML IV SOLN
750.0000 mg | INTRAVENOUS | Status: DC
  Administered 2012-11-07: 750 mg via INTRAVENOUS
  Filled 2012-11-07: qty 150

## 2012-11-07 MED ORDER — ACETAMINOPHEN 650 MG RE SUPP: 650.0000 mg | Freq: Four times a day (QID) | RECTAL | Status: DC | PRN

## 2012-11-07 MED ORDER — ASPIRIN EC 81 MG PO TBEC
81.0000 mg | DELAYED_RELEASE_TABLET | Freq: Every day | ORAL | Status: DC
  Administered 2012-11-08: 81 mg via ORAL
  Filled 2012-11-07 (×2): qty 1

## 2012-11-07 MED ORDER — BENAZEPRIL-HYDROCHLOROTHIAZIDE 20-12.5 MG PO TABS: 1.0000 | ORAL_TABLET | Freq: Every day | ORAL | Status: DC

## 2012-11-07 MED ORDER — OXYCODONE-ACETAMINOPHEN 5-325 MG PO TABS
2.0000 | ORAL_TABLET | Freq: Once | ORAL | Status: AC
  Administered 2012-11-07: 2 via ORAL
  Filled 2012-11-07: qty 2

## 2012-11-07 MED ORDER — LEVOFLOXACIN IN D5W 750 MG/150ML IV SOLN
INTRAVENOUS | Status: AC
  Filled 2012-11-07: qty 150

## 2012-11-07 MED ORDER — SODIUM CHLORIDE 0.9 % IJ SOLN
3.0000 mL | Freq: Two times a day (BID) | INTRAMUSCULAR | Status: DC
  Administered 2012-11-07: 3 mL via INTRAVENOUS

## 2012-11-07 MED ORDER — SODIUM CHLORIDE 0.9 % IV SOLN: 250.0000 mL | INTRAVENOUS | Status: DC | PRN

## 2012-11-07 MED ORDER — MAGNESIUM SULFATE 40 MG/ML IJ SOLN
2.0000 g | Freq: Once | INTRAMUSCULAR | Status: AC
  Administered 2012-11-07: 2 g via INTRAVENOUS
  Filled 2012-11-07: qty 50

## 2012-11-07 MED ORDER — DOCUSATE SODIUM 100 MG PO CAPS
100.0000 mg | ORAL_CAPSULE | Freq: Two times a day (BID) | ORAL | Status: DC
  Administered 2012-11-07 – 2012-11-08 (×2): 100 mg via ORAL
  Filled 2012-11-07 (×4): qty 1

## 2012-11-07 MED ORDER — ALBUTEROL SULFATE (5 MG/ML) 0.5% IN NEBU: 2.5000 mg | INHALATION_SOLUTION | RESPIRATORY_TRACT | Status: DC | PRN

## 2012-11-07 MED ORDER — ALPRAZOLAM 0.5 MG PO TABS
0.5000 mg | ORAL_TABLET | Freq: Three times a day (TID) | ORAL | Status: DC
  Administered 2012-11-07 – 2012-11-08 (×3): 0.5 mg via ORAL
  Filled 2012-11-07 (×3): qty 1

## 2012-11-07 MED ORDER — NITROGLYCERIN 2 % TD OINT
1.0000 [in_us] | TOPICAL_OINTMENT | Freq: Four times a day (QID) | TRANSDERMAL | Status: DC
  Administered 2012-11-07 – 2012-11-08 (×3): 1 [in_us] via TOPICAL
  Filled 2012-11-07 (×3): qty 1

## 2012-11-07 MED ORDER — ALBUTEROL SULFATE (5 MG/ML) 0.5% IN NEBU
5.0000 mg | INHALATION_SOLUTION | Freq: Once | RESPIRATORY_TRACT | Status: AC
  Administered 2012-11-07: 5 mg via RESPIRATORY_TRACT
  Filled 2012-11-07: qty 1

## 2012-11-07 MED ORDER — LEVALBUTEROL HCL 0.63 MG/3ML IN NEBU
0.6300 mg | INHALATION_SOLUTION | Freq: Four times a day (QID) | RESPIRATORY_TRACT | Status: DC
  Administered 2012-11-07 – 2012-11-08 (×5): 0.63 mg via RESPIRATORY_TRACT
  Filled 2012-11-07 (×5): qty 3

## 2012-11-07 MED ORDER — MAGNESIUM SULFATE 50 % IJ SOLN: 2.0000 g | Freq: Once | INTRAVENOUS | Status: DC

## 2012-11-07 MED ORDER — HYDRALAZINE HCL 20 MG/ML IJ SOLN
10.0000 mg | Freq: Once | INTRAMUSCULAR | Status: AC
  Administered 2012-11-07: 10 mg via INTRAVENOUS
  Filled 2012-11-07: qty 1

## 2012-11-07 MED ORDER — AMLODIPINE BESYLATE 5 MG PO TABS
10.0000 mg | ORAL_TABLET | Freq: Every day | ORAL | Status: DC
  Administered 2012-11-07 – 2012-11-08 (×2): 10 mg via ORAL
  Filled 2012-11-07: qty 1
  Filled 2012-11-07: qty 2

## 2012-11-07 MED ORDER — HYDROCHLOROTHIAZIDE 12.5 MG PO CAPS
12.5000 mg | ORAL_CAPSULE | Freq: Every day | ORAL | Status: DC
  Administered 2012-11-07 – 2012-11-08 (×2): 12.5 mg via ORAL
  Filled 2012-11-07 (×4): qty 1

## 2012-11-07 MED ORDER — INSULIN GLARGINE 100 UNIT/ML ~~LOC~~ SOLN
15.0000 [IU] | Freq: Every day | SUBCUTANEOUS | Status: DC
  Administered 2012-11-07: 15 [IU] via SUBCUTANEOUS

## 2012-11-07 MED ORDER — OXYCODONE HCL 5 MG PO TABS
5.0000 mg | ORAL_TABLET | ORAL | Status: DC | PRN
  Administered 2012-11-08 (×3): 5 mg via ORAL
  Filled 2012-11-07 (×3): qty 1

## 2012-11-07 MED ORDER — MORPHINE SULFATE 2 MG/ML IJ SOLN
2.0000 mg | INTRAMUSCULAR | Status: DC | PRN
  Administered 2012-11-07 – 2012-11-08 (×2): 2 mg via INTRAVENOUS
  Filled 2012-11-07 (×2): qty 1

## 2012-11-07 MED ORDER — BENAZEPRIL HCL 10 MG PO TABS
20.0000 mg | ORAL_TABLET | Freq: Every day | ORAL | Status: DC
  Administered 2012-11-08: 20 mg via ORAL
  Filled 2012-11-07 (×3): qty 2
  Filled 2012-11-07: qty 1

## 2012-11-07 MED ORDER — ACETAMINOPHEN 325 MG PO TABS: 650.0000 mg | ORAL_TABLET | Freq: Four times a day (QID) | ORAL | Status: DC | PRN

## 2012-11-07 MED ORDER — CLONIDINE HCL 0.2 MG PO TABS
0.2000 mg | ORAL_TABLET | Freq: Three times a day (TID) | ORAL | Status: DC
  Administered 2012-11-07 – 2012-11-08 (×3): 0.2 mg via ORAL
  Filled 2012-11-07 (×3): qty 1

## 2012-11-07 MED ORDER — FUROSEMIDE 10 MG/ML IJ SOLN
40.0000 mg | Freq: Once | INTRAMUSCULAR | Status: AC
  Administered 2012-11-07: 40 mg via INTRAVENOUS
  Filled 2012-11-07: qty 4

## 2012-11-07 NOTE — ED Notes (Signed)
Pt aware dr Sherrie Mustache will be down to see pt and we will notify her of cough med and food. No complaints. nad

## 2012-11-07 NOTE — ED Provider Notes (Signed)
History  This chart was scribed for Joya Gaskins, MD by Ardeen Jourdain, ED Scribe. This patient was seen in room APA01/APA01 and the patient's care was started at 1114.  CSN: [id number]  Arrival date & time 11/07/12  1059   First MD Initiated Contact with Patient 11/07/12 1114      Chief Complaint  Patient presents with  . Hypertension     Patient is a 47 y.o. female presenting with hypertension and cough. The history is provided by the patient. No language interpreter was used.  Hypertension This is a chronic problem. The current episode started more than 2 days ago. The problem occurs constantly. The problem has not changed since onset.Associated symptoms include shortness of breath. Pertinent negatives include no chest pain and no abdominal pain.  Cough This is a new problem. The problem has not changed since onset.The cough is non-productive. The maximum temperature recorded prior to her arrival was 100 to 100.9 F. Associated symptoms include shortness of breath. Pertinent negatives include no chest pain, no chills, no sweats, no rhinorrhea and no sore throat.    [name] is a 47 y.o. female who presents to the Emergency Department complaining of cough with associated elevated blood pressure, fever and SOB. She states she has come into contact with the flu. She states her SOB is aggravated by walking. Pt states she quit smoking 3 days ago.    Past Medical History  Diagnosis Date  . Alcohol use   . Diabetes mellitus, type 2   . Obesity   . Hyperlipidemia   . Hypertension   . Anxiety   . Panic attacks   . Hypercholesteremia     Past Surgical History  Procedure Date  . Dilation and curettage of uterus     Family History  Problem Relation Age of Onset  . Hypertension Mother   . Hypertension Father     CABG   . Hypertension Sister   . Heart attack Mother   . Arthritis    . Cancer    . Diabetes    . Asthma      History  Substance Use Topics  .  Smoking status: Current Every Day Smoker -- 1.0 packs/day    Types: Cigarettes  . Smokeless tobacco: Not on file  . Alcohol Use: No     Comment: stopped jan 2012    No OB history available.    Review of Systems  Constitutional: Negative for chills.  HENT: Negative for sore throat and rhinorrhea.   Respiratory: Positive for cough and shortness of breath.   Cardiovascular: Negative for chest pain.  Gastrointestinal: Negative for abdominal pain.  Neurological: Positive for weakness.  Psychiatric/Behavioral: Negative for agitation.  All other systems reviewed and are negative.    Allergies  Review of patient's allergies indicates no known allergies.  Home Medications   Current Outpatient Rx  Name  Route  Sig  Dispense  Refill  . ALPRAZOLAM 0.5 MG PO TABS      TAKE 1 TABLET BY MOUTH 3 TIMES A DAY.   90 tablet   1   . AMLODIPINE BESYLATE 10 MG PO TABS   Oral   Take 1 tablet (10 mg total) by mouth daily.   30 tablet   5     Dose increase effective 07/12/2012 .  Pt may tak ...   . BENAZEPRIL-HYDROCHLOROTHIAZIDE 20-12.5 MG PO TABS      Two tablets once daily at 6am   60 tablet  5   . CLONIDINE HCL 0.3 MG PO TABS      Dose increase effective 05/10/2012. One tablet at 6am, 2pm and 10pm   90 tablet   5   . FLUOXETINE HCL 10 MG PO CAPS   Oral   Take 1 capsule (10 mg total) by mouth daily.   30 capsule   2   . METFORMIN HCL 1000 MG PO TABS   Oral   Take 1 tablet (1,000 mg total) by mouth 2 (two) times daily with a meal.   60 tablet   11   . PRAVASTATIN SODIUM 40 MG PO TABS   Oral   Take 1 tablet (40 mg total) by mouth every evening.   30 tablet   5     Triage Vitals: BP 148/102  Pulse 81  Temp 99.4 F (37.4 C) (Oral)  Resp 23  Ht 5\' 1"  (1.549 m)  Wt 201 lb (91.173 kg)  BMI 37.98 kg/m2  SpO2 98%  LMP 10/19/2012 BP 179/120  Pulse 71  Temp 99.4 F (37.4 C) (Oral)  Resp 28  Ht 5\' 1"  (1.549 m)  Wt 201 lb (91.173 kg)  BMI 37.98 kg/m2  SpO2  93%  LMP 10/19/2012  Physical Exam  CONSTITUTIONAL: Well developed/well nourished HEAD AND FACE: Normocephalic/atraumatic EYES: EOMI/PERRL ENMT: Mucous membranes moist NECK: supple no meningeal signs SPINE:entire spine nontender CV: S1/S2 noted, no murmurs/rubs/gallops noted LUNGS: Decreased breath sounds bilaterally, no apparent distress ABDOMEN: soft, nontender, no rebound or guarding GU:no cva tenderness NEURO: Pt is awake/alert, moves all extremitiesx4 EXTREMITIES: pulses normal, full ROM, no edema SKIN: warm, color normal PSYCH: no abnormalities of mood noted   ED Course  Procedures  DIAGNOSTIC STUDIES: Oxygen Saturation is 98% on [place], normal by my interpretation.    COORDINATION OF CARE:  11:27 AM: Discussed treatment plan which includes CBC with pt at bedside and pt agreed to plan.  Pt self reports h/o CHF, and she reports increased dyspnea on exertion.  Will obtain labs/imaging  1:27 PM D/w dr Sherrie Mustache.  She requests I give dose of lasix/NTG and reassess 3:03 PM Pt having some dyspnea on exertion I spoke to her PCP Dr [name], and she feels due to poor social situation and she feels she may need inpatient management D/w dr Sherrie Mustache to evaluate for admission  MDM  Nursing notes including past medical history and social history reviewed and considered in documentation xrays reviewed and considered Previous records reviewed and considered Labs/vital reviewed and considered      Date: 11/07/2012  Rate: 76  Rhythm: normal sinus rhythm  QRS Axis: normal  Intervals: normal  ST/T Wave abnormalities: inverted t waves  Conduction Disutrbances:none  Narrative Interpretation:   Old EKG Reviewed: changes noted and twave changes new from prior     I personally performed the services described in this documentation, which was scribed in my presence. The recorded information has been reviewed and is accurate.      Joya Gaskins, MD 11/07/12 [phone]

## 2012-11-07 NOTE — ED Notes (Signed)
Advised Shon Hale rn , receiving nurse that I will call [facility] and let them know to meet pt in room. Meal given

## 2012-11-07 NOTE — ED Notes (Signed)
Pt states she has had a cough and her blood pressure has been elevated for a a few days

## 2012-11-07 NOTE — ED Notes (Signed)
Pt rr resting prior to ambulation was 19. Pt ambulated around nursing desk. Pt breathing slightly harder. Returned to room and rr 28 with 9 ra sat. 02 reapplied and edp aware.

## 2012-11-07 NOTE — Telephone Encounter (Signed)
I am aware  

## 2012-11-07 NOTE — ED Notes (Signed)
Dr Sherrie Mustache in to see pt

## 2012-11-07 NOTE — H&P (Signed)
[facility] History and Physical  [name] AVW:[id number] DOB: February 04, 1966 DOA: 11/07/2012  Referring physician: ED physician, Dr. Bebe Shaggy PCP: Syliva Overman, MD  Specialists:   Chief Complaint: Shortness of breath.  HPI: [name] is a 47 y.o. female a history significant for noncompliance, malignant hypertension, type 2 diabetes mellitus, and depression, who presents to the hospital today with a chief complaint of shortness of breath. Her shortness of breath started a few days ago. It has been progressive. She has shortness of breath with rest and with activity. She does not endorse acute orthopnea, but says that she sleeps on several pillows chronically. She has had a productive cough with yellow sputum. She has had no significant abdominal or lower extremity swelling. She has had some transient chest pain in her mid to upper chest with some radiation to the left arm. She denies associated nausea or diaphoresis. She has had subjective fever and chills. She has been noncompliant with her medications for several months up until one week ago when her primary care physician, Dr. [name], restarted most of not all of her chronic medications for treatment of her diabetes and hypertension. She says that she has been depressed, but not suicidal. When she gets depressed, she does not take her medications.  In the emergency department, she is borderline febrile with a temperature of 99.4. She is hypertensive with a blood pressure of 179/120. EKG is pending. Her chest x-ray reveals pulmonary vascular congestion, mild atelectasis versus early infiltrate at the right lung base. Her lab data are significant for a pro BNP of 3256, normal troponin I., glucose of 265, magnesium of 1.4, and potassium of 3.2. She is being admitted for further evaluation and management.   Review of Systems: As above in history present illness. In addition, her review of systems is positive for generalized  weakness, depression, anxiety,. Negative for suicidal ideation. Otherwise negative.  Past Medical History  Diagnosis Date  . Alcohol use     Now abstinent.  . Diabetes mellitus, type 2   . Obesity   . Hyperlipidemia   . Hypertension   . Anxiety   . Panic attacks   . Hypercholesteremia   . Diabetic Charcot foot 12/30/2011  . RECTAL BLEEDING, HX OF 12/05/2009    Qualifier: Diagnosis of  By: Diana Eves    . Ankle fracture, lateral malleolus, closed 12/30/2011  . Breast mass, left 12/15/2011  . Tobacco abuse   . ANXIETY 04/17/2010    Qualifier: Diagnosis of  By: Arville Lime, Dawn     Past Surgical History  Procedure Date  . Dilation and curettage of uterus    Social History: She is widowed. She has 2 adult children. She receives disability benefits. She stopped smoking 3 days ago after smoking a pack of cigarettes a day for more than 20 years. She quit drinking alcohol. She denies illicit drug use.    No Known Allergies  Family History  Problem Relation Age of Onset  . Hypertension Mother   . Hypertension Father     CABG   . Hypertension Sister   . Heart attack Mother   . Arthritis    . Cancer    . Diabetes    . Asthma       Prior to Admission medications   Medication Sig Start Date End Date Taking? Authorizing Provider  ALPRAZolam Prudy Feeler) 0.5 MG tablet TAKE 1 TABLET BY MOUTH 3 TIMES A DAY. 11/01/12  Yes Kerri Perches, MD  amLODipine (  NORVASC) 10 MG tablet Take 1 tablet (10 mg total) by mouth daily. 11/02/12 11/02/13 Yes Kerri Perches, MD  benazepril-hydrochlorthiazide (LOTENSIN HCT) 20-12.5 MG per tablet 2 tablets daily. Two tablets once daily at 6am 11/02/12 11/02/13 Yes Kerri Perches, MD  cloNIDine (CATAPRES) 0.2 MG tablet Take 0.2 mg by mouth 3 (three) times daily.   Yes Historical Provider, MD  FLUoxetine (PROZAC) 10 MG capsule Take 1 capsule (10 mg total) by mouth daily. 11/02/12 11/02/13 Yes Kerri Perches, MD  ibuprofen (ADVIL,MOTRIN) 200 MG tablet Take 400 mg  by mouth every 6 (six) hours as needed. Pain.   Yes Historical Provider, MD  labetalol (NORMODYNE) 100 MG tablet Take 100 mg by mouth 2 (two) times daily.   Yes Historical Provider, MD  metFORMIN (GLUCOPHAGE) 1000 MG tablet Take 1 tablet (1,000 mg total) by mouth 2 (two) times daily with a meal. 11/02/12 11/02/13 Yes Kerri Perches, MD  pravastatin (PRAVACHOL) 40 MG tablet Take 1 tablet (40 mg total) by mouth every evening. 11/02/12 11/02/13 Yes Kerri Perches, MD   Physical Exam: Filed Vitals:   11/07/12 1434 11/07/12 1622 11/07/12 1654 11/07/12 1815  BP: 179/120 141/115  174/123  Pulse: 71 65    Temp:  98 F (36.7 C)    TempSrc:  Oral    Resp: 28 22    Height:  5\' 1"  (1.549 m)    Weight:  90.719 kg (200 lb)    SpO2: 93% 94% 97%      General:  Overweight 47 year old African-American woman laying in bed, in no acute distress.  Eyes: Pupils are equal, round, and reactive to light. Extraocular movements are intact. Conjunctivae are clear. Sclerae are white.  ENT: Oropharynx reveals moist mucous membranes. No posterior exudates or erythema.  Neck: Supple, no JVD, no adenopathy, no thyromegaly.  Cardiovascular: S1, S2, with a 2/6 systolic murmur.  Respiratory: Occasional crackles bilaterally, mostly noted right lower lung field.  Abdomen: Mildly obese, positive bowel sounds, soft, nontender, nondistended.  Skin: Fair to good turgor. No rashes.  Musculoskeletal: Pedal pulses palpable. No pedal edema or pretibial edema.  Psychiatric: She is alert and oriented x3. She has a flat affect. Her speech is clear. She denies suicidal ideation. She is not anxious. Her demeanor is pleasant.  Neurologic: Cranial nerves II through XII are intact. Strength is 5 over 5 throughout. Sensation is intact.  Labs on Admission:  Basic Metabolic Panel:  Lab 11/07/12 1610 11/07/12 1153 11/01/12 1443  NA -- 139 141  K -- 3.2* 3.6  CL -- 98 104  CO2 -- 31 25  GLUCOSE -- 265* 189*  BUN -- 8 11    CREATININE -- 0.81 0.84  CALCIUM -- 9.7 9.3  MG 1.4* -- --  PHOS -- -- --   Liver Function Tests:  Lab 11/01/12 1443  AST 14  ALT 12  ALKPHOS 85  BILITOT 0.7  PROT 6.2  ALBUMIN 3.5   No results found for this basename: LIPASE:5,AMYLASE:5 in the last 168 hours No results found for this basename: AMMONIA:5 in the last 168 hours CBC:  Lab 11/07/12 1153  WBC 4.9  NEUTROABS 4.0  HGB 15.8*  HCT 45.1  MCV 96.6  PLT 153   Cardiac Enzymes:  Lab 11/07/12 1153  CKTOTAL --  CKMB --  CKMBINDEX --  TROPONINI <0.30    BNP (last 3 results)  Basename 11/07/12 1153  PROBNP 3256.0*   CBG: No results found for this basename: GLUCAP:5 in  the last 168 hours  Radiological Exams on Admission: Dg Chest Portable 1 View  11/07/2012  *RADIOLOGY REPORT*  Clinical Data: Shortness of breath, cough, hypertension, diabetes  PORTABLE CHEST - 1 VIEW  Comparison: Portable exam 1211 hours compared to 04/08/2011  Findings: Enlargement of cardiac silhouette with pulmonary vascular congestion. Tortuous aorta. Mild atelectasis versus early infiltrate at right lung base. Remaining lungs grossly clear. No pleural effusion or pneumothorax. Bones unremarkable.  IMPRESSION: Enlargement of cardiac silhouette with pulmonary vascular congestion. Mild atelectasis versus early infiltrate at right lung base.   Original Report Authenticated By: Ulyses Southward, M.D.     EKG: Pending.  Assessment/Plan Principal Problem:  *CAP (community acquired pneumonia) Active Problems:  DM (diabetes mellitus), type 2, uncontrolled  OBESITY  DEPRESSION  NICOTINE ADDICTION  Medical non-compliance  HTN (hypertension), malignant  Hypokalemia   1. This is a 47 year old woman with admitted noncompliance with her antihypertensive medications and her diabetes medications, who presents with shortness of breath. She was given IV Lasix by the ED physician with the assumption that she had acute CHF, based on her elevated pro BNP. I  am not totally convinced that she has acute congestive heart failure as she has no overt pulmonary edema on her chest x-ray in no overt anasarca. However, congestive heart failure is considered a possibility given her malignant hypertension. With her low-grade fever and productive cough, I am more concerned about community-acquired pneumonia and/or an influenza-like illness. She has a history of malignant hypertension, usually associated with noncompliance. She has uncontrolled diabetes again, associated with noncompliance. She was recently started on multiple antihypertensive medications for treatment just as of last week. Her diabetes is being treated with metformin, as she "could not give myself insulin". She was congratulated on recently stopping smoking.      Plan: 1. The patient was given 40 mg of IV Lasix, 40 mEq of potassium, and 325 mg of aspirin and started on nitroglycerin ointment. She was also given 10 mg of hydralazine. 2. Will hold on further IV Lasix. We'll continue nitroglycerin ointment in part because of malignant hypertension. 3. Will resume all of her antihypertensive medications including amlodipine, clonidine, HCTZ, and benazepril. We'll hold labetalol for now. 4. Will replete potassium chloride orally and magnesium intravenously. 5. We'll start Levaquin for treatment of pneumonia. We'll start Tamiflu empirically. If her influenza panel is negative, will discontinue Tamiflu. 6. We'll continue metformin. We'll add sliding scale NovoLog and Lantus. 7. We'll continue her antidepressants and anxiolytics. Outpatient referral to psychiatry. 8. For further evaluation, will order 2-D echocardiogram, cardiac enzymes, fasting lipid profile, HIV, strep pneumo and Legionella antigens, influenza panel PCR.  Code Status: Full code Family Communication: None available. Disposition Plan: Discharged to home in the next 24-48 hours.  Time spent: One hour.  [facility] Pager [phone].  If 7PM-7AM, please contact night-coverage [email or web address] Password [facility] 11/07/2012, 7:22 PM

## 2012-11-08 ENCOUNTER — Encounter (HOSPITAL_COMMUNITY): Payer: Self-pay | Admitting: *Deleted

## 2012-11-08 ENCOUNTER — Encounter (HOSPITAL_COMMUNITY): Payer: Self-pay | Admitting: Internal Medicine

## 2012-11-08 ENCOUNTER — Emergency Department (HOSPITAL_COMMUNITY): Payer: PRIVATE HEALTH INSURANCE

## 2012-11-08 ENCOUNTER — Inpatient Hospital Stay (HOSPITAL_COMMUNITY): Payer: PRIVATE HEALTH INSURANCE

## 2012-11-08 ENCOUNTER — Inpatient Hospital Stay (HOSPITAL_COMMUNITY)
Admission: EM | Admit: 2012-11-08 | Discharge: 2012-11-12 | DRG: 193 | Disposition: A | Discharge status: Home Health | Payer: PRIVATE HEALTH INSURANCE | Attending: Internal Medicine | Admitting: Internal Medicine

## 2012-11-08 DIAGNOSIS — I43 Cardiomyopathy in diseases classified elsewhere: Secondary | ICD-10-CM | POA: Diagnosis present

## 2012-11-08 DIAGNOSIS — J189 Pneumonia, unspecified organism: Secondary | ICD-10-CM | POA: Diagnosis present

## 2012-11-08 DIAGNOSIS — E119 Type 2 diabetes mellitus without complications: Secondary | ICD-10-CM | POA: Diagnosis present

## 2012-11-08 DIAGNOSIS — I509 Heart failure, unspecified: Secondary | ICD-10-CM

## 2012-11-08 DIAGNOSIS — F17200 Nicotine dependence, unspecified, uncomplicated: Secondary | ICD-10-CM | POA: Diagnosis present

## 2012-11-08 DIAGNOSIS — F329 Major depressive disorder, single episode, unspecified: Secondary | ICD-10-CM | POA: Diagnosis present

## 2012-11-08 DIAGNOSIS — E876 Hypokalemia: Secondary | ICD-10-CM | POA: Diagnosis present

## 2012-11-08 DIAGNOSIS — F411 Generalized anxiety disorder: Secondary | ICD-10-CM | POA: Diagnosis present

## 2012-11-08 DIAGNOSIS — I059 Rheumatic mitral valve disease, unspecified: Secondary | ICD-10-CM

## 2012-11-08 DIAGNOSIS — I119 Hypertensive heart disease without heart failure: Secondary | ICD-10-CM | POA: Diagnosis present

## 2012-11-08 DIAGNOSIS — E669 Obesity, unspecified: Secondary | ICD-10-CM | POA: Diagnosis present

## 2012-11-08 DIAGNOSIS — R0603 Acute respiratory distress: Secondary | ICD-10-CM | POA: Diagnosis present

## 2012-11-08 DIAGNOSIS — J209 Acute bronchitis, unspecified: Secondary | ICD-10-CM | POA: Diagnosis present

## 2012-11-08 DIAGNOSIS — J9600 Acute respiratory failure, unspecified whether with hypoxia or hypercapnia: Secondary | ICD-10-CM | POA: Diagnosis present

## 2012-11-08 DIAGNOSIS — F3289 Other specified depressive episodes: Secondary | ICD-10-CM | POA: Diagnosis present

## 2012-11-08 DIAGNOSIS — E785 Hyperlipidemia, unspecified: Secondary | ICD-10-CM

## 2012-11-08 DIAGNOSIS — J9601 Acute respiratory failure with hypoxia: Secondary | ICD-10-CM | POA: Diagnosis present

## 2012-11-08 DIAGNOSIS — E1169 Type 2 diabetes mellitus with other specified complication: Secondary | ICD-10-CM | POA: Diagnosis present

## 2012-11-08 DIAGNOSIS — I10 Essential (primary) hypertension: Secondary | ICD-10-CM

## 2012-11-08 LAB — BLOOD GAS, ARTERIAL
Acid-Base Excess: 4.3 mmol/L — ABNORMAL HIGH (ref 0.0–2.0)
Bicarbonate: 28 mEq/L — ABNORMAL HIGH (ref 20.0–24.0)
Drawn by: 22223
FIO2: 21 %
O2 Saturation: 89.7 %
Patient temperature: 37
TCO2: 23.7 mmol/L (ref 0–100)
pCO2 arterial: 39.7 mmHg (ref 35.0–45.0)
pH, Arterial: 7.462 — ABNORMAL HIGH (ref 7.350–7.450)
pO2, Arterial: 59.7 mmHg — ABNORMAL LOW (ref 80.0–100.0)

## 2012-11-08 LAB — GLUCOSE, CAPILLARY
Glucose-Capillary: 158 mg/dL — ABNORMAL HIGH (ref 70–99)
Glucose-Capillary: 225 mg/dL — ABNORMAL HIGH (ref 70–99)

## 2012-11-08 LAB — COMPREHENSIVE METABOLIC PANEL
ALT: 11 U/L (ref 0–35)
ALT: 13 U/L (ref 0–35)
AST: 13 U/L (ref 0–37)
AST: 18 U/L (ref 0–37)
Albumin: 2.7 g/dL — ABNORMAL LOW (ref 3.5–5.2)
Albumin: 3 g/dL — ABNORMAL LOW (ref 3.5–5.2)
Alkaline Phosphatase: 82 U/L (ref 39–117)
Alkaline Phosphatase: 87 U/L (ref 39–117)
BUN: 11 mg/dL (ref 6–23)
BUN: 15 mg/dL (ref 6–23)
CO2: 32 mEq/L (ref 19–32)
CO2: 28 mEq/L (ref 19–32)
Calcium: 9.5 mg/dL (ref 8.4–10.5)
Calcium: 9.5 mg/dL (ref 8.4–10.5)
Chloride: 93 mEq/L — ABNORMAL LOW (ref 96–112)
Chloride: 94 mEq/L — ABNORMAL LOW (ref 96–112)
Creatinine, Ser: 0.94 mg/dL (ref 0.50–1.10)
Creatinine, Ser: 0.93 mg/dL (ref 0.50–1.10)
GFR calc Af Amer: 82 mL/min — ABNORMAL LOW (ref >90)
GFR calc Af Amer: 83 mL/min — ABNORMAL LOW (ref >90)
GFR calc non Af Amer: 71 mL/min — ABNORMAL LOW (ref >90)
GFR calc non Af Amer: 72 mL/min — ABNORMAL LOW (ref >90)
Glucose, Bld: 208 mg/dL — ABNORMAL HIGH (ref 70–99)
Glucose, Bld: 157 mg/dL — ABNORMAL HIGH (ref 70–99)
Potassium: 3.2 mEq/L — ABNORMAL LOW (ref 3.5–5.1)
Potassium: 3.3 mEq/L — ABNORMAL LOW (ref 3.5–5.1)
Sodium: 135 mEq/L (ref 135–145)
Sodium: 133 mEq/L — ABNORMAL LOW (ref 135–145)
Total Bilirubin: 1.1 mg/dL (ref 0.3–1.2)
Total Bilirubin: 0.8 mg/dL (ref 0.3–1.2)
Total Protein: 6.5 g/dL (ref 6.0–8.3)
Total Protein: 7 g/dL (ref 6.0–8.3)

## 2012-11-08 LAB — CBC WITH DIFFERENTIAL/PLATELET
Basophils Absolute: 0 10*3/uL (ref 0.0–0.1)
Basophils Relative: 1 % (ref 0–1)
Eosinophils Absolute: 0.1 10*3/uL (ref 0.0–0.7)
Eosinophils Relative: 1 % (ref 0–5)
HCT: 45.9 % (ref 36.0–46.0)
Hemoglobin: 16.3 g/dL — ABNORMAL HIGH (ref 12.0–15.0)
Lymphocytes Relative: 17 % (ref 12–46)
Lymphs Abs: 0.9 10*3/uL (ref 0.7–4.0)
MCH: 33.9 pg (ref 26.0–34.0)
MCHC: 35.5 g/dL (ref 30.0–36.0)
MCV: 95.4 fL (ref 78.0–100.0)
Monocytes Absolute: 0.5 10*3/uL (ref 0.1–1.0)
Monocytes Relative: 10 % (ref 3–12)
Neutro Abs: 3.8 10*3/uL (ref 1.7–7.7)
Neutrophils Relative %: 71 % (ref 43–77)
Platelets: 156 10*3/uL (ref 150–400)
RBC: 4.81 MIL/uL (ref 3.87–5.11)
RDW: 12.9 % (ref 11.5–15.5)
WBC: 5.3 10*3/uL (ref 4.0–10.5)

## 2012-11-08 LAB — CBC
HCT: 44 % (ref 36.0–46.0)
Hemoglobin: 15 g/dL (ref 12.0–15.0)
MCH: 32.9 pg (ref 26.0–34.0)
MCHC: 34.1 g/dL (ref 30.0–36.0)
MCV: 96.5 fL (ref 78.0–100.0)
Platelets: 162 10*3/uL (ref 150–400)
RBC: 4.56 MIL/uL (ref 3.87–5.11)
RDW: 13 % (ref 11.5–15.5)
WBC: 4.4 10*3/uL (ref 4.0–10.5)

## 2012-11-08 LAB — TROPONIN I
Troponin I: <0.3 ng/mL (ref <0.30)
Troponin I: <0.3 ng/mL (ref <0.30)

## 2012-11-08 LAB — LIPID PANEL
Cholesterol: 224 mg/dL — ABNORMAL HIGH (ref 0–200)
HDL: 50 mg/dL (ref >39)
LDL Cholesterol: 137 mg/dL — ABNORMAL HIGH (ref 0–99)
Total CHOL/HDL Ratio: 4.5 RATIO
Triglycerides: 184 mg/dL — ABNORMAL HIGH (ref <150)
VLDL: 37 mg/dL (ref 0–40)

## 2012-11-08 LAB — HIV ANTIBODY (ROUTINE TESTING W REFLEX): HIV: NONREACTIVE

## 2012-11-08 LAB — TSH: TSH: 1.379 u[IU]/mL (ref 0.350–4.500)

## 2012-11-08 LAB — HEMOGLOBIN A1C
Hgb A1c MFr Bld: 9.6 % — ABNORMAL HIGH (ref <5.7)
Mean Plasma Glucose: 229 mg/dL — ABNORMAL HIGH (ref <117)

## 2012-11-08 LAB — STREP PNEUMONIAE URINARY ANTIGEN: Strep Pneumo Urinary Antigen: NEGATIVE

## 2012-11-08 LAB — PRO B NATRIURETIC PEPTIDE: Pro B Natriuretic peptide (BNP): 725.3 pg/mL — ABNORMAL HIGH (ref 0–125)

## 2012-11-08 MED ORDER — IOHEXOL 300 MG/ML  SOLN
100.0000 mL | Freq: Once | INTRAMUSCULAR | Status: AC | PRN
  Administered 2012-11-08: 100 mL via INTRAVENOUS

## 2012-11-08 MED ORDER — OXYCODONE-ACETAMINOPHEN 5-325 MG PO TABS: 1.0000 | ORAL_TABLET | ORAL | Status: DC | PRN

## 2012-11-08 MED ORDER — FUROSEMIDE 10 MG/ML IJ SOLN
40.0000 mg | Freq: Once | INTRAMUSCULAR | Status: AC
  Administered 2012-11-08: 40 mg via INTRAVENOUS
  Filled 2012-11-08: qty 4

## 2012-11-08 MED ORDER — INSULIN GLARGINE 100 UNIT/ML ~~LOC~~ SOLN: 15.0000 [IU] | Freq: Every day | SUBCUTANEOUS | Status: DC

## 2012-11-08 MED ORDER — ALBUTEROL SULFATE (5 MG/ML) 0.5% IN NEBU
2.5000 mg | INHALATION_SOLUTION | Freq: Once | RESPIRATORY_TRACT | Status: AC
  Administered 2012-11-08: 2.5 mg via RESPIRATORY_TRACT
  Filled 2012-11-08: qty 0.5

## 2012-11-08 MED ORDER — BENAZEPRIL HCL 20 MG PO TABS: 20.0000 mg | ORAL_TABLET | Freq: Every day | ORAL | Status: DC

## 2012-11-08 MED ORDER — LEVOFLOXACIN 500 MG PO TABS: 500.0000 mg | ORAL_TABLET | Freq: Every day | ORAL | Status: DC

## 2012-11-08 MED ORDER — HYDROCODONE-ACETAMINOPHEN 5-325 MG PO TABS
1.0000 | ORAL_TABLET | Freq: Once | ORAL | Status: AC
  Administered 2012-11-08: 1 via ORAL
  Filled 2012-11-08: qty 1

## 2012-11-08 MED ORDER — POTASSIUM CHLORIDE CRYS ER 20 MEQ PO TBCR
30.0000 meq | EXTENDED_RELEASE_TABLET | Freq: Two times a day (BID) | ORAL | Status: DC
  Administered 2012-11-08: 30 meq via ORAL
  Filled 2012-11-08: qty 1

## 2012-11-08 MED ORDER — POTASSIUM CHLORIDE CRYS ER 15 MEQ PO TBCR: 30.0000 meq | EXTENDED_RELEASE_TABLET | Freq: Every day | ORAL | Status: DC

## 2012-11-08 MED ORDER — IPRATROPIUM BROMIDE 0.02 % IN SOLN
0.5000 mg | Freq: Once | RESPIRATORY_TRACT | Status: AC
  Administered 2012-11-08: 0.5 mg via RESPIRATORY_TRACT
  Filled 2012-11-08: qty 2.5

## 2012-11-08 MED ORDER — FUROSEMIDE 40 MG PO TABS: 40.0000 mg | ORAL_TABLET | Freq: Every day | ORAL | Status: DC

## 2012-11-08 MED ORDER — GUAIFENESIN 100 MG/5ML PO SOLN
5.0000 mL | Freq: Once | ORAL | Status: AC
  Administered 2012-11-08: 100 mg via ORAL
  Filled 2012-11-08: qty 5

## 2012-11-08 NOTE — Care Management Note (Signed)
    Page 1 of 2   11/08/2012     2:34:25 PM   CARE MANAGEMENT NOTE 11/08/2012  Patient:  [name], [name]   Account Number:  [id number]  Date Initiated:  11/08/2012  Documentation initiated by:  Sharrie Rothman  Subjective/Objective Assessment:   Pt admitted from home with CAP. Pt lives alone and has a PCS aide that comes daily for 1.5 hours. Pt will return home at discharge. Pt is independent with ADL's. Pt PCP Dr. [name]. Is noncompliant with meds.     Action/Plan:   May benefit from [facility] to monitor compliance with medications. Will continue to monitor.   Anticipated DC Date:  11/09/2012   Anticipated DC Plan:  HOME W HOME HEALTH SERVICES      DC Planning Services  CM consult      [place] [facility]  HOME HEALTH   Choice offered to / List presented to:  C-1 Patient        HH arranged  HH-1 RN      [facility] agency  [facility]   Status of service:  Completed, signed off Medicare Important Message given?   (If response is "NO", the following Medicare IM given date fields will be blank) Date Medicare IM given:   Date Additional Medicare IM given:    Discharge Disposition:  HOME W HOME HEALTH SERVICES  Per UR Regulation:    If discussed at Long Length of Stay Meetings, dates discussed:    Comments:  11/08/12 1432 Arlyss Queen, RN BSN CM Pt discharged home today with [facility] RN. Alroy Bailiff of [facility] is aware and will collect the pts information from the chart. Pt stated that she will have someone in her family give her the money to get her medications. Pt does not need any DME. Pt and pts nurse aware of discharge arrangements.  11/08/12 1335 Arlyss Queen, RN BSN CM

## 2012-11-08 NOTE — Discharge Summary (Signed)
Physician Discharge Summary  [name] VHQ:[id number] DOB: 04-20-1966 DOA: 11/07/2012  PCP: Syliva Overman, MD  Admit date: 11/07/2012 Discharge date: 11/08/2012  Time spent: Greater than 30 minutes  Recommendations for Outpatient Follow-up:  1. The patient will followup with Dr. [name] and Dr. Dietrich Pates as scheduled. 2. The patient will need her blood potassium reassessed at the followup appointments. 3. Home health registered nursing was ordered to assist with medication compliance.  Discharge Diagnoses:  1. Dyspnea, secondary to hypertensive cardiomyopathy and pneumonia. 2. Hypertensive cardiomyopathy and grade 1 diastolic dysfunction without acute congestive heart failure. Ejection fraction 45-50%. 3. Malignant hypertension, secondary to noncompliance. 4. Uncontrolled diabetes, secondary to noncompliance. The patient's hemoglobin A1c was 9.6. 5. Community-acquired pneumonia. 6. Obesity. 7. Nicotine addiction/Tobacco abuse, recently quit.  8. Chronic depression with anxiety. No suicidal ideation. 9. Hypokalemia and hypomagnesemia. 10. Chronic right shoulder arthropathy. 11. Hyperlipidemia. Her fasting lipid profile revealed a total cholesterol of 224, triglycerides of 184, HDL cholesterol of 50, and LDL cholesterol 137.  Discharge Condition: Improved.  Diet recommendation: Heart healthy in carbohydrate modified.  Filed Weights   11/07/12 1104 11/07/12 1622 11/08/12 0500  Weight: 91.173 kg (201 lb) 90.719 kg (200 lb) 90.8 kg (200 lb 2.8 oz)    History of present illness:  The patient is a 47 year old woman with a history significant for noncompliance, malignant hypertension, and type 2 diabetes mellitus, who presented to the emergency Department 11/07/2012 with a chief complaint of shortness of breath. In the emergency department, she was borderline febrile with a temperature of 99.4. She was hypertensive with a blood pressure 179/120. Her EKG revealed LVH and left axis  deviation. Her chest x-ray revealed pulmonary vascular congestion and mild atelectasis versus early infiltrate at the right lung base. Her lab data were significant for proBNP of 3256, normal troponin I., glucose of 265, magnesium of 1.4, and potassium of 3.2. She was admitted for further evaluation and management.  Hospital Course:  The patient was given 40 mg of IV Lasix by the ED physician for presumed decompensated congestive heart failure. She was also started on nitroglycerin ointment. She was given 10 mg of IV hydralazine. Per my assessment, the patient did not appear to have decompensated congestive heart failure. There was no clear pulmonary edema on the chest x-ray and clinically, there was no peripheral edema. Her symptoms were more consistent with community-acquired pneumonia coupled with a probable hypertensive cardiomyopathy. She had been lost to followup and had been admittedly noncompliant with most of not all of her medications up until approximately one week ago. After she followed up with Dr. [name], she started taking some of her chronic medications for hypertension and diabetes. All of these medications were continued with exception of labetalol. She was started on potassium chloride repletion/supplementation orally. She was given 2 g of magnesium sulfate for treatment of hypomagnesemia. Levaquin was given for treatment of pneumonia. Tamiflu was ordered initially, but when her influenza panel PCR was found to be negative, Tamiflu was discontinued. Metformin was restarted. Sliding scale NovoLog and Lantus were added for better blood pressure control.  For further evaluation, a number of studies were ordered. Her troponin I was negative x3. Her TSH was within normal limits. Her 2-D echocardiogram revealed severe LVH, ejection fraction of 45-50%, and grade 1 diastolic dysfunction. There were no regional wall motion abnormalities of the left ventricle.  Her hemoglobin A1c was 9.6. Her HIV  was nonreactive.  The patient's blood pressure improved significantly, even after nitroglycerin ointment was  discontinued. Her blood glucose control improved as well during the hospitalization. I encouraged the patient to restart insulin chronically for better treatment of her diabetes. She was hesitant, but became receptive following our conversation. The registered nurse instructed her on self insulin administration. She was instructed to continue metformin twice daily as previously prescribed by Dr. [name]. I prescribed Lantus at 15 units each bedtime via the Lantus pen. She felt that she would be more compliant with the insulin pen. For management of her malignant hypertension, I decided to narrow her treatment to clonidine, benazepril, and amlodipine. HCTZ was discontinued when Lasix was prescribed at the time of discharge. Labetalol was discontinued  as the patient's blood pressure virtually normalized without it.  The patient had been noncompliant. I had a heart to heart talk with her regarding become more compliant. I explained to her that her shortness of breath was secondary to her malignant hypertension and it's stressful effects on her heart. She has a firm belief in God and I encouraged her to take better care of herself because of her love for God. She was receptive.  She was discharged to home in improved and stable condition. Her blood pressure was 135/94 at the time of discharge. She was continued on treatment for her pneumonia with Levaquin for 7 more days. An appointment was made for her to followup with cardiologist Dr. Dietrich Pates in 2 weeks for ongoing management of her hypertensive cardiomyopathy.   Procedures: 2-D echocardiogram: 11/08/2012:Study Conclusions  - Left ventricle: The cavity size was normal. Wall thickness was increased in a pattern of severe LVH. Systolic function was mildly reduced. The estimated ejection fraction was in the range of 45% to 50%. Wall motion  was normal; there were no regional wall motion abnormalities. Doppler parameters are consistent with abnormal left ventricular relaxation (grade 1 diastolic dysfunction). Doppler parameters are consistent with elevated ventricular end-diastolic filling pressure. - Mitral valve: Mildly thickened leaflets . Mild regurgitation. - Left atrium: The atrium was mildly dilated. - Tricuspid valve: Trivial regurgitation. - Pericardium, extracardiac: A trivial pericardial effusion was identified. Transthoracic echocardiography. M-mode, complete 2D, spectral Doppler, and color Doppler. Height: Height: 154.9cm. Height: 61in. Weight: Weight: 90.7kg. Weight: 199.6lb. Body mass index: BMI: 37.8kg/m^2. Body surface area: BSA: 1.44m^2. Patient status: Inpatient.      Consultations:    Discharge Exam: Filed Vitals:   11/08/12 0752 11/08/12 0809 11/08/12 1327 11/08/12 1401  BP: 150/99  135/94   Pulse: 73  72   Temp:      TempSrc:      Resp:   18   Height:      Weight:      SpO2:  100% 97% 97%    General: pleasant obese 47 year old African-American woman sitting up in bed, in no acute distress. Cardiovascular: S1, S2, with a 1-2/6 systolic murmur. Respiratory: clear to auscultation bilaterally. Extremities: No pedal edema. Pedal pulses palpable.  Discharge Instructions  Discharge Orders    Future Appointments: Provider: Department: Dept Phone: Center:   11/15/2012 2:15 PM Kerri Perches, MD [facility] [phone] [facility]   11/16/2012 1:00 PM Charisse March [place], OT [facility] [phone] None   11/18/2012 10:15 AM Bethany Rita Ohara, O[facility] [phone] None   11/21/2012 1:00 PM Lenn Sink [facility] [phone] None   11/21/2012 2:00 PM Jodelle Gross, NP [facility] [phone] [facility]   11/24/2012 1:45 PM Bethany Rita Ohara, OTR [facility] [phone] None   12/06/2012 2:00 PM Kerri Perches, MD [facility] [phone] [facility]     Future Orders Please Complete By Expires   Diet - low sodium heart healthy      Diet Carb Modified      Increase activity slowly      Discharge instructions      Comments:   Continue to stop smoking. Take medications as prescribed.       Medication List     As of 11/08/2012  2:27 PM    STOP taking these medications         benazepril-hydrochlorthiazide 20-12.5 MG per tablet   Commonly known as: LOTENSIN HCT      ibuprofen 200 MG tablet   Commonly known as: ADVIL,MOTRIN      labetalol 100 MG tablet   Commonly known as: NORMODYNE      TAKE these medications         ALPRAZolam 0.5 MG tablet   Commonly known as: XANAX   TAKE 1 TABLET BY MOUTH 3 TIMES A DAY.      amLODipine 10 MG tablet   Commonly known as: NORVASC   Take 1 tablet (10 mg total) by mouth daily.      benazepril 20 MG tablet   Commonly known as: LOTENSIN   Take 1 tablet (20 mg total) by mouth daily.      cloNIDine 0.2 MG tablet   Commonly known as: CATAPRES   Take 0.2 mg by mouth 3 (three) times daily.      FLUoxetine 10 MG capsule   Commonly known as: PROZAC   Take 1 capsule (10 mg total) by mouth daily.      furosemide 40 MG tablet   Commonly known as: LASIX   Take 1 tablet (40 mg total) by mouth daily. "Fluid pill". For your heart.      insulin glargine 100 UNIT/ML injection   Commonly known as: LANTUS   Inject 15 Units into the skin at bedtime. Lantus Pen.      levofloxacin 500 MG tablet   Commonly known as: LEVAQUIN   Take 1 tablet (500 mg total) by mouth daily. Antibiotic for 6 more days.      metFORMIN 1000 MG tablet   Commonly known as: GLUCOPHAGE   Take 1 tablet (1,000 mg total) by mouth 2 (two) times daily with a meal.      oxyCODONE-acetaminophen 5-325 MG per tablet   Commonly known as: PERCOCET/ROXICET   Take 1 tablet by mouth every 4 (four)  hours as needed for pain.      potassium chloride SA 15 MEQ tablet   Commonly known as: KLOR-CON M15   Take 2 tablets (30 mEq total) by mouth daily.      pravastatin 40 MG tablet   Commonly known as: PRAVACHOL   Take 1 tablet (40 mg total) by mouth every evening.           Follow-up Information    Follow up with Syliva Overman, MD. On 11/15/2012. (2:15 pm. You will need your blood potassium rechecked.)    Contact information:   [address] [place] [address] [phone]       Follow up with Poipu Bing, MD. On 11/21/2012. (AT 2:00 pm.)    Contact information:   [address] [place] [address] [phone]           The results of significant diagnostics from this hospitalization (including  imaging, microbiology, ancillary and laboratory) are listed below for reference.    Significant Diagnostic Studies: Dg Chest Portable 1 View  11/07/2012  *RADIOLOGY REPORT*  Clinical Data: Shortness of breath, cough, hypertension, diabetes  PORTABLE CHEST - 1 VIEW  Comparison: Portable exam 1211 hours compared to 04/08/2011  Findings: Enlargement of cardiac silhouette with pulmonary vascular congestion. Tortuous aorta. Mild atelectasis versus early infiltrate at right lung base. Remaining lungs grossly clear. No pleural effusion or pneumothorax. Bones unremarkable.  IMPRESSION: Enlargement of cardiac silhouette with pulmonary vascular congestion. Mild atelectasis versus early infiltrate at right lung base.   Original Report Authenticated By: Ulyses Southward, M.D.     Microbiology: No results found for this or any previous visit (from the past 240 hour(s)).   Labs: Basic Metabolic Panel:  Lab 11/08/12 1191 11/07/12 1629 11/07/12 1153 11/01/12 1443  NA 135 -- 139 141  K 3.2* -- 3.2* 3.6  CL 93* -- 98 104  CO2 32 -- 31 25  GLUCOSE 208* -- 265* 189*  BUN 11 -- 8 11  CREATININE 0.94 -- 0.81 0.84  CALCIUM 9.5 -- 9.7 9.3  MG -- 1.4* -- --  PHOS -- -- -- --    Liver Function Tests:  Lab 11/08/12 0534 11/01/12 1443  AST 13 14  ALT 11 12  ALKPHOS 82 85  BILITOT 1.1 0.7  PROT 6.5 6.2  ALBUMIN 2.7* 3.5   No results found for this basename: LIPASE:5,AMYLASE:5 in the last 168 hours No results found for this basename: AMMONIA:5 in the last 168 hours CBC:  Lab 11/08/12 0534 11/07/12 1153  WBC 4.4 4.9  NEUTROABS -- 4.0  HGB 15.0 15.8*  HCT 44.0 45.1  MCV 96.5 96.6  PLT 162 153   Cardiac Enzymes:  Lab 11/08/12 0037 11/07/12 1853 11/07/12 1153  CKTOTAL -- -- --  CKMB -- -- --  CKMBINDEX -- -- --  TROPONINI <0.30 <0.30 <0.30   BNP: BNP (last 3 results)  Basename 11/07/12 1153  PROBNP 3256.0*   CBG:  Lab 11/08/12 1130 11/08/12 0750 11/07/12 2152  GLUCAP 225* 158* 209*       Signed:  Rob Mciver  [facility] 11/08/2012, 2:27 PM

## 2012-11-08 NOTE — ED Notes (Addendum)
Pt does continue to have a cough and complain of SOB, but has refused recommended testing.  EDP and RN discussed with pt that a diagnosis and decision to admit cannot be made unless tests are completed. Pt has agreed to have tests completed.

## 2012-11-08 NOTE — ED Notes (Signed)
Pt refusing blood work also.  Explained procedure to pt.  Pt verbalized understanding but states, "I don't want to go through all that again.  Just give me a breathing treatment and send me home."  edp notified.

## 2012-11-08 NOTE — Progress Notes (Signed)
Patient discharged home with family.  IV removed - WNL.  Instructed on new medications and changes to old ones.  Follow up appointments in place.  Patient verbalizes understanding of discharge instructions concerning monitoring blood sugar, diet, and abx,.  Home Health to follow.  Has no questions at this time.  Stable to discharge.

## 2012-11-08 NOTE — Progress Notes (Signed)
*  PRELIMINARY RESULTS* Echocardiogram 2D Echocardiogram has been performed.  Conrad [place] 11/08/2012, 11:03 AM

## 2012-11-08 NOTE — Progress Notes (Signed)
UR Chart Review Completed  

## 2012-11-08 NOTE — ED Notes (Signed)
Pt continues to refuse lab work.  MD aware.  Nebulizer treatment in progress.

## 2012-11-08 NOTE — Progress Notes (Signed)
Patient instructed on checking blood sugar and administering insulin independently.  Patient does very well following instructions and administering correctly.   Will continue to let patient check and administer own insulin.  Also educated on diabetic diet - carenotes given and dietician consulted.  Advised and educated on smoking cessation as well.  Patient states that she wants to quit smoking and wants to learn to control diabetes at home.  Very accepting of education.

## 2012-11-08 NOTE — ED Provider Notes (Signed)
History  This chart was scribed for [name], MD by Ardeen Jourdain, ED Scribe. This patient was seen in room APA09/APA09 and the patient's care was started at 1807.  CSN: [id number]  Arrival date & time 11/08/12  1751   First MD Initiated Contact with Patient 11/08/12 1807      Chief Complaint  Patient presents with  . Shortness of Breath    Patient is a 47 y.o. female presenting with shortness of breath. The history is provided by the patient. No language interpreter was used.  Shortness of Breath  The current episode started 3 to 5 days ago. The problem occurs continuously. The problem has been gradually worsening. The problem is mild. Associated symptoms include rhinorrhea, cough and shortness of breath. Pertinent negatives include no chest pain and no fever. There was no intake of a foreign body.    [name] is a 47 y.o. female who presents to the Emergency Department complaining of SOB with associated congestion. She reports she was discharged from the ED a few hours ago. She states the SOB feels like "she just can't breathe."   Past Medical History  Diagnosis Date  . Alcohol use     Now abstinent.  . Diabetes mellitus, type 2   . Obesity   . Hyperlipidemia   . Hypertension   . Anxiety   . Panic attacks   . Hypercholesteremia   . Diabetic Charcot foot 12/30/2011  . RECTAL BLEEDING, HX OF 12/05/2009    Qualifier: Diagnosis of  By: Diana Eves    . Ankle fracture, lateral malleolus, closed 12/30/2011  . Breast mass, left 12/15/2011  . Tobacco abuse   . ANXIETY 04/17/2010    Qualifier: Diagnosis of  By: Garnette Czech PA, Dawn    . Hypertensive cardiomyopathy 11/08/2012    Ejection fraction 40-45%.  . Diastolic dysfunction 11/08/2012    Grade 1.    Past Surgical History  Procedure Date  . Dilation and curettage of uterus     Family History  Problem Relation Age of Onset  . Hypertension Mother   . Hypertension Father     CABG   . Hypertension Sister   .  Heart attack Mother   . Arthritis    . Cancer    . Diabetes    . Asthma      History  Substance Use Topics  . Smoking status: Former Smoker -- 1.0 packs/day    Types: Cigarettes    Quit date: 11/01/2012  . Smokeless tobacco: Not on file  . Alcohol Use: No     Comment: stopped jan 2012   No OB history available.  Review of Systems  Constitutional: Positive for chills. Negative for fever and fatigue.  HENT: Positive for congestion and rhinorrhea. Negative for sinus pressure and ear discharge.   Eyes: Negative for discharge.  Respiratory: Positive for cough, chest tightness and shortness of breath.   Cardiovascular: Negative for chest pain.  Gastrointestinal: Positive for abdominal pain. Negative for diarrhea.  Genitourinary: Negative for frequency and hematuria.  Musculoskeletal: Negative for back pain.  Skin: Negative for rash.  Neurological: Negative for seizures and headaches.  Hematological: Negative.   Psychiatric/Behavioral: Negative for hallucinations.  All other systems reviewed and are negative.    Allergies  Review of patient's allergies indicates no known allergies.  Home Medications   Current Outpatient Rx  Name  Route  Sig  Dispense  Refill  . ALPRAZOLAM 0.5 MG PO TABS  TAKE 1 TABLET BY MOUTH 3 TIMES A DAY.   90 tablet   1   . AMLODIPINE BESYLATE 10 MG PO TABS   Oral   Take 1 tablet (10 mg total) by mouth daily.   30 tablet   5     Dose increase effective 07/12/2012 .  Pt may tak ...   . BENAZEPRIL HCL 20 MG PO TABS   Oral   Take 1 tablet (20 mg total) by mouth daily.   30 tablet   2   . CLONIDINE HCL 0.2 MG PO TABS   Oral   Take 0.2 mg by mouth 3 (three) times daily.         Marland Kitchen FLUOXETINE HCL 10 MG PO CAPS   Oral   Take 1 capsule (10 mg total) by mouth daily.   30 capsule   2   . FUROSEMIDE 40 MG PO TABS   Oral   Take 1 tablet (40 mg total) by mouth daily. "Fluid pill". For your heart.   30 tablet   2   . INSULIN  GLARGINE 100 UNIT/ML [place] SOLN   Subcutaneous   Inject 15 Units into the skin at bedtime. Lantus Pen.   3 mL   6   . LEVOFLOXACIN 500 MG PO TABS   Oral   Take 1 tablet (500 mg total) by mouth daily. Antibiotic for 6 more days.   6 tablet   0   . METFORMIN HCL 1000 MG PO TABS   Oral   Take 1 tablet (1,000 mg total) by mouth 2 (two) times daily with a meal.   60 tablet   11   . OXYCODONE-ACETAMINOPHEN 5-325 MG PO TABS   Oral   Take 1 tablet by mouth every 4 (four) hours as needed for pain.   30 tablet   0   . POTASSIUM CHLORIDE CRYS ER 15 MEQ PO TBCR   Oral   Take 2 tablets (30 mEq total) by mouth daily.   60 tablet   2   . PRAVASTATIN SODIUM 40 MG PO TABS   Oral   Take 1 tablet (40 mg total) by mouth every evening.   30 tablet   5     Triage Vitals: BP 149/107  Pulse 74  Temp 98.6 F (37 C) (Oral)  Resp 26  Ht 5\' 1"  (1.549 m)  Wt 200 lb (90.719 kg)  BMI 37.79 kg/m2  SpO2 95%  LMP 10/19/2012  Physical Exam  Nursing note and vitals reviewed. Constitutional: She is oriented to person, place, and time. She appears well-developed.  HENT:  Head: Normocephalic and atraumatic.  Eyes: Conjunctivae normal and EOM are normal. No scleral icterus.  Neck: Neck supple. No thyromegaly present.  Cardiovascular: Normal rate, regular rhythm and normal heart sounds.  Exam reveals no gallop and no friction rub.   No murmur heard. Pulmonary/Chest: Effort normal. No stridor. She has no wheezes. She has no rales. She exhibits no tenderness.       Crackles in bases bilaterally   Abdominal: Soft. She exhibits no distension. There is no tenderness. There is no rebound.  Musculoskeletal: Normal range of motion. She exhibits no edema.  Lymphadenopathy:    She has no cervical adenopathy.  Neurological: She is oriented to person, place, and time. Coordination normal.  Skin: No rash noted. No erythema.  Psychiatric: She has a normal mood and affect. Her behavior is normal.    ED  Course  Procedures (including critical care  time)  DIAGNOSTIC STUDIES: Oxygen Saturation is 95% on room air, adequate by my interpretation.    COORDINATION OF CARE:  6:13 PM: Discussed treatment plan which includes CXR, CBC, and CMP with pt at bedside and pt agreed to plan.   7:45 PM: Pt rechecked, she seems normal and comfortable, discharge was discussed    Results for orders placed during the hospital encounter of 11/07/12  BASIC METABOLIC PANEL      Component Value Range   Sodium 139  135 - 145 mEq/L   Potassium 3.2 (*) 3.5 - 5.1 mEq/L   Chloride 98  96 - 112 mEq/L   CO2 31  19 - 32 mEq/L   Glucose, Bld 265 (*) 70 - 99 mg/dL   BUN 8  6 - 23 mg/dL   Creatinine, Ser 0.27  0.50 - 1.10 mg/dL   Calcium 9.7  8.4 - 25.3 mg/dL   GFR calc non Af Amer 85 (*) >90 mL/min   GFR calc Af Amer >90  >90 mL/min  CBC WITH DIFFERENTIAL      Component Value Range   WBC 4.9  4.0 - 10.5 K/uL   RBC 4.67  3.87 - 5.11 MIL/uL   Hemoglobin 15.8 (*) 12.0 - 15.0 g/dL   HCT 66.4  40.3 - 47.4 %   MCV 96.6  78.0 - 100.0 fL   MCH 33.8  26.0 - 34.0 pg   MCHC 35.0  30.0 - 36.0 g/dL   RDW 25.9  56.3 - 87.5 %   Platelets 153  150 - 400 K/uL   Neutrophils Relative 80 (*) 43 - 77 %   Neutro Abs 4.0  1.7 - 7.7 K/uL   Lymphocytes Relative 11 (*) 12 - 46 %   Lymphs Abs 0.5 (*) 0.7 - 4.0 K/uL   Monocytes Relative 7  3 - 12 %   Monocytes Absolute 0.3  0.1 - 1.0 K/uL   Eosinophils Relative 1  0 - 5 %   Eosinophils Absolute 0.1  0.0 - 0.7 K/uL   Basophils Relative 1  0 - 1 %   Basophils Absolute 0.0  0.0 - 0.1 K/uL  TROPONIN I      Component Value Range   Troponin I <0.30  <0.30 ng/mL  PRO B NATRIURETIC PEPTIDE      Component Value Range   Pro B Natriuretic peptide (BNP) 3256.0 (*) 0 - 125 pg/mL  INFLUENZA PANEL BY PCR      Component Value Range   Influenza A By PCR NEGATIVE  NEGATIVE   Influenza B By PCR NEGATIVE  NEGATIVE   H1N1 flu by pcr NOT DETECTED  NOT DETECTED  MAGNESIUM      Component Value  Range   Magnesium 1.4 (*) 1.5 - 2.5 mg/dL  TSH      Component Value Range   TSH 1.379  0.350 - 4.500 uIU/mL  HEMOGLOBIN A1C      Component Value Range   Hemoglobin A1C 9.6 (*) <5.7 %   Mean Plasma Glucose 229 (*) <117 mg/dL  APTT      Component Value Range   aPTT 29  24 - 37 seconds  PROTIME-INR      Component Value Range   Prothrombin Time 12.7  11.6 - 15.2 seconds   INR 0.96  0.00 - 1.49  HIV ANTIBODY (ROUTINE TESTING)      Component Value Range   HIV NON REACTIVE  NON REACTIVE  STREP PNEUMONIAE URINARY ANTIGEN  Component Value Range   Strep Pneumo Urinary Antigen NEGATIVE  NEGATIVE  COMPREHENSIVE METABOLIC PANEL      Component Value Range   Sodium 135  135 - 145 mEq/L   Potassium 3.2 (*) 3.5 - 5.1 mEq/L   Chloride 93 (*) 96 - 112 mEq/L   CO2 32  19 - 32 mEq/L   Glucose, Bld 208 (*) 70 - 99 mg/dL   BUN 11  6 - 23 mg/dL   Creatinine, Ser 1.47  0.50 - 1.10 mg/dL   Calcium 9.5  8.4 - 82.9 mg/dL   Total Protein 6.5  6.0 - 8.3 g/dL   Albumin 2.7 (*) 3.5 - 5.2 g/dL   AST 13  0 - 37 U/L   ALT 11  0 - 35 U/L   Alkaline Phosphatase 82  39 - 117 U/L   Total Bilirubin 1.1  0.3 - 1.2 mg/dL   GFR calc non Af Amer 71 (*) >90 mL/min   GFR calc Af Amer 82 (*) >90 mL/min  CBC      Component Value Range   WBC 4.4  4.0 - 10.5 K/uL   RBC 4.56  3.87 - 5.11 MIL/uL   Hemoglobin 15.0  12.0 - 15.0 g/dL   HCT 56.2  13.0 - 86.5 %   MCV 96.5  78.0 - 100.0 fL   MCH 32.9  26.0 - 34.0 pg   MCHC 34.1  30.0 - 36.0 g/dL   RDW 78.4  69.6 - 29.5 %   Platelets 162  150 - 400 K/uL  TROPONIN I      Component Value Range   Troponin I <0.30  <0.30 ng/mL  TROPONIN I      Component Value Range   Troponin I <0.30  <0.30 ng/mL  LIPID PANEL      Component Value Range   Cholesterol 224 (*) 0 - 200 mg/dL   Triglycerides 284 (*) <150 mg/dL   HDL 50  >13 mg/dL   Total CHOL/HDL Ratio 4.5     VLDL 37  0 - 40 mg/dL   LDL Cholesterol 244 (*) 0 - 99 mg/dL  GLUCOSE, CAPILLARY      Component Value  Range   Glucose-Capillary 209 (*) 70 - 99 mg/dL  GLUCOSE, CAPILLARY      Component Value Range   Glucose-Capillary 158 (*) 70 - 99 mg/dL  GLUCOSE, CAPILLARY      Component Value Range   Glucose-Capillary 225 (*) 70 - 99 mg/dL   Dg Chest Port 1 View  11/08/2012  *RADIOLOGY REPORT*  Clinical Data: Shortness of breath, history of pneumonia CHF  PORTABLE CHEST - 1 VIEW  Comparison: 11/07/2012  Findings: Stable cardiomegaly with vascular congestion as before. Minimal basilar atelectasis.  No significant change.  No effusion or pneumothorax.  Trachea midline.  Degenerative changes of the spine.  IMPRESSION: Cardiomegaly with vascular congestion, stable   Original Report Authenticated By: Judie Petit. Shick, M.D.    Dg Chest Portable 1 View  11/07/2012  *RADIOLOGY REPORT*  Clinical Data: Shortness of breath, cough, hypertension, diabetes  PORTABLE CHEST - 1 VIEW  Comparison: Portable exam 1211 hours compared to 04/08/2011  Findings: Enlargement of cardiac silhouette with pulmonary vascular congestion. Tortuous aorta. Mild atelectasis versus early infiltrate at right lung base. Remaining lungs grossly clear. No pleural effusion or pneumothorax. Bones unremarkable.  IMPRESSION: Enlargement of cardiac silhouette with pulmonary vascular congestion. Mild atelectasis versus early infiltrate at right lung base.   Original Report Authenticated By: Ulyses Southward,  M.D.        No diagnosis found.    Date: 11/08/2012  Rate: 84  Rhythm: normal sinus rhythm  QRS Axis: left  Intervals: normal  ST/T Wave abnormalities: normal  Conduction Disutrbances:none  Narrative Interpretation:   Old EKG Reviewed: none available   MDM        The chart was scribed for me under my direct supervision.  I personally performed the history, physical, and medical decision making and all procedures in the evaluation of this patient.[name], MD 11/08/12 2141

## 2012-11-08 NOTE — ED Notes (Addendum)
Released from [facility], Inpatient with CHF and pneumonia, shortly after d/c home, became sob.  Frequent cough.

## 2012-11-08 NOTE — ED Notes (Signed)
Pt refusing ABG.  States, "I've been stuck too many times and it hurts too bad."   edp notified.
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
## 2012-11-09 ENCOUNTER — Encounter (HOSPITAL_COMMUNITY): Payer: Self-pay | Admitting: Internal Medicine

## 2012-11-09 DIAGNOSIS — J209 Acute bronchitis, unspecified: Secondary | ICD-10-CM | POA: Diagnosis present

## 2012-11-09 DIAGNOSIS — J9601 Acute respiratory failure with hypoxia: Secondary | ICD-10-CM | POA: Diagnosis present

## 2012-11-09 DIAGNOSIS — R0603 Acute respiratory distress: Secondary | ICD-10-CM | POA: Diagnosis present

## 2012-11-09 DIAGNOSIS — I509 Heart failure, unspecified: Secondary | ICD-10-CM | POA: Diagnosis present

## 2012-11-09 DIAGNOSIS — J9600 Acute respiratory failure, unspecified whether with hypoxia or hypercapnia: Secondary | ICD-10-CM

## 2012-11-09 DIAGNOSIS — I10 Essential (primary) hypertension: Secondary | ICD-10-CM

## 2012-11-09 DIAGNOSIS — J189 Pneumonia, unspecified organism: Principal | ICD-10-CM

## 2012-11-09 HISTORY — DX: Acute respiratory failure, unspecified whether with hypoxia or hypercapnia: J96.00

## 2012-11-09 LAB — COMPREHENSIVE METABOLIC PANEL
ALT: 13 U/L (ref 0–35)
AST: 24 U/L (ref 0–37)
Albumin: 2.9 g/dL — ABNORMAL LOW (ref 3.5–5.2)
Alkaline Phosphatase: 84 U/L (ref 39–117)
BUN: 14 mg/dL (ref 6–23)
CO2: 28 mEq/L (ref 19–32)
Calcium: 9.3 mg/dL (ref 8.4–10.5)
Chloride: 95 mEq/L — ABNORMAL LOW (ref 96–112)
Creatinine, Ser: 0.95 mg/dL (ref 0.50–1.10)
GFR calc Af Amer: 81 mL/min — ABNORMAL LOW (ref >90)
GFR calc non Af Amer: 70 mL/min — ABNORMAL LOW (ref >90)
Glucose, Bld: 145 mg/dL — ABNORMAL HIGH (ref 70–99)
Potassium: 4 mEq/L (ref 3.5–5.1)
Sodium: 134 mEq/L — ABNORMAL LOW (ref 135–145)
Total Bilirubin: 0.8 mg/dL (ref 0.3–1.2)
Total Protein: 6.9 g/dL (ref 6.0–8.3)

## 2012-11-09 LAB — EXPECTORATED SPUTUM ASSESSMENT W GRAM STAIN, RFLX TO RESP C

## 2012-11-09 LAB — CBC
HCT: 45.6 % (ref 36.0–46.0)
Hemoglobin: 16 g/dL — ABNORMAL HIGH (ref 12.0–15.0)
MCH: 33.7 pg (ref 26.0–34.0)
MCHC: 35.1 g/dL (ref 30.0–36.0)
MCV: 96 fL (ref 78.0–100.0)
Platelets: 168 10*3/uL (ref 150–400)
RBC: 4.75 MIL/uL (ref 3.87–5.11)
RDW: 12.8 % (ref 11.5–15.5)
WBC: 5.5 10*3/uL (ref 4.0–10.5)

## 2012-11-09 LAB — GLUCOSE, CAPILLARY
Glucose-Capillary: 144 mg/dL — ABNORMAL HIGH (ref 70–99)
Glucose-Capillary: 148 mg/dL — ABNORMAL HIGH (ref 70–99)
Glucose-Capillary: 220 mg/dL — ABNORMAL HIGH (ref 70–99)

## 2012-11-09 LAB — LEGIONELLA ANTIGEN, URINE: Legionella Antigen, Urine: NEGATIVE

## 2012-11-09 LAB — MAGNESIUM: Magnesium: 1.6 mg/dL (ref 1.5–2.5)

## 2012-11-09 MED ORDER — AMLODIPINE BESYLATE 5 MG PO TABS
10.0000 mg | ORAL_TABLET | Freq: Every day | ORAL | Status: DC
  Administered 2012-11-09 – 2012-11-12 (×4): 10 mg via ORAL
  Filled 2012-11-09 (×4): qty 2

## 2012-11-09 MED ORDER — SIMVASTATIN 20 MG PO TABS
20.0000 mg | ORAL_TABLET | Freq: Every day | ORAL | Status: DC
  Administered 2012-11-09 – 2012-11-11 (×3): 20 mg via ORAL
  Filled 2012-11-09 (×3): qty 1

## 2012-11-09 MED ORDER — ENOXAPARIN SODIUM 40 MG/0.4ML ~~LOC~~ SOLN
40.0000 mg | SUBCUTANEOUS | Status: DC
  Administered 2012-11-09 – 2012-11-12 (×4): 40 mg via SUBCUTANEOUS
  Filled 2012-11-09 (×4): qty 0.4

## 2012-11-09 MED ORDER — FLUOXETINE HCL 10 MG PO CAPS
10.0000 mg | ORAL_CAPSULE | Freq: Every day | ORAL | Status: DC
  Administered 2012-11-09 – 2012-11-12 (×4): 10 mg via ORAL
  Filled 2012-11-09 (×6): qty 1

## 2012-11-09 MED ORDER — ONDANSETRON HCL 4 MG/2ML IJ SOLN: 4.0000 mg | INTRAMUSCULAR | Status: DC | PRN

## 2012-11-09 MED ORDER — LEVOFLOXACIN IN D5W 750 MG/150ML IV SOLN
750.0000 mg | INTRAVENOUS | Status: DC
  Administered 2012-11-09 – 2012-11-11 (×4): 750 mg via INTRAVENOUS
  Filled 2012-11-09 (×6): qty 150

## 2012-11-09 MED ORDER — ALPRAZOLAM 0.5 MG PO TABS
0.5000 mg | ORAL_TABLET | Freq: Three times a day (TID) | ORAL | Status: DC
  Administered 2012-11-09 – 2012-11-12 (×9): 0.5 mg via ORAL
  Filled 2012-11-09 (×9): qty 1

## 2012-11-09 MED ORDER — BENAZEPRIL HCL 10 MG PO TABS
20.0000 mg | ORAL_TABLET | Freq: Every day | ORAL | Status: DC
  Administered 2012-11-09 – 2012-11-10 (×2): 20 mg via ORAL
  Filled 2012-11-09 (×2): qty 2

## 2012-11-09 MED ORDER — HYDRALAZINE HCL 20 MG/ML IJ SOLN
10.0000 mg | INTRAMUSCULAR | Status: DC | PRN
  Administered 2012-11-10 – 2012-11-11 (×2): 10 mg via INTRAVENOUS
  Filled 2012-11-09 (×2): qty 1

## 2012-11-09 MED ORDER — SODIUM CHLORIDE 0.9 % IJ SOLN
3.0000 mL | Freq: Two times a day (BID) | INTRAMUSCULAR | Status: DC
  Administered 2012-11-09 – 2012-11-12 (×6): 3 mL via INTRAVENOUS
  Filled 2012-11-09: qty 3

## 2012-11-09 MED ORDER — LEVALBUTEROL HCL 0.63 MG/3ML IN NEBU
0.6300 mg | INHALATION_SOLUTION | Freq: Four times a day (QID) | RESPIRATORY_TRACT | Status: DC
  Administered 2012-11-09 – 2012-11-10 (×7): 0.63 mg via RESPIRATORY_TRACT
  Filled 2012-11-09 (×7): qty 3

## 2012-11-09 MED ORDER — POTASSIUM CHLORIDE CRYS ER 20 MEQ PO TBCR
30.0000 meq | EXTENDED_RELEASE_TABLET | Freq: Every day | ORAL | Status: AC
  Administered 2012-11-09 – 2012-11-11 (×3): 30 meq via ORAL
  Filled 2012-11-09 (×3): qty 1

## 2012-11-09 MED ORDER — SODIUM CHLORIDE 0.9 % IV SOLN
250.0000 mL | INTRAVENOUS | Status: DC | PRN
  Administered 2012-11-09 – 2012-11-10 (×2): 250 mL via INTRAVENOUS

## 2012-11-09 MED ORDER — SODIUM CHLORIDE 0.9 % IJ SOLN: 3.0000 mL | INTRAMUSCULAR | Status: DC | PRN

## 2012-11-09 MED ORDER — IPRATROPIUM BROMIDE 0.02 % IN SOLN
0.5000 mg | Freq: Four times a day (QID) | RESPIRATORY_TRACT | Status: DC
  Administered 2012-11-09 – 2012-11-10 (×7): 0.5 mg via RESPIRATORY_TRACT
  Filled 2012-11-09 (×7): qty 2.5

## 2012-11-09 MED ORDER — SORBITOL 70 % SOLN
30.0000 mL | Freq: Every day | Status: DC | PRN
  Administered 2012-11-09: 30 mL via ORAL
  Filled 2012-11-09: qty 30

## 2012-11-09 MED ORDER — INSULIN ASPART 100 UNIT/ML ~~LOC~~ SOLN
0.0000 [IU] | Freq: Every day | SUBCUTANEOUS | Status: DC
  Administered 2012-11-09: 2 [IU] via SUBCUTANEOUS

## 2012-11-09 MED ORDER — ALPRAZOLAM 0.5 MG PO TABS
0.5000 mg | ORAL_TABLET | Freq: Three times a day (TID) | ORAL | Status: DC | PRN
  Administered 2012-11-09 (×2): 0.5 mg via ORAL
  Filled 2012-11-09 (×2): qty 1

## 2012-11-09 MED ORDER — POTASSIUM CHLORIDE CRYS ER 20 MEQ PO TBCR
40.0000 meq | EXTENDED_RELEASE_TABLET | ORAL | Status: AC
  Administered 2012-11-09 (×3): 40 meq via ORAL
  Filled 2012-11-09 (×3): qty 2

## 2012-11-09 MED ORDER — OXYCODONE-ACETAMINOPHEN 5-325 MG PO TABS
1.0000 | ORAL_TABLET | ORAL | Status: DC | PRN
  Administered 2012-11-09 – 2012-11-12 (×16): 1 via ORAL
  Filled 2012-11-09 (×16): qty 1

## 2012-11-09 MED ORDER — FLEET ENEMA 7-19 GM/118ML RE ENEM: 1.0000 | ENEMA | Freq: Every day | RECTAL | Status: DC | PRN

## 2012-11-09 MED ORDER — BENZONATATE 100 MG PO CAPS
200.0000 mg | ORAL_CAPSULE | Freq: Three times a day (TID) | ORAL | Status: DC | PRN
  Administered 2012-11-09 – 2012-11-12 (×8): 200 mg via ORAL
  Filled 2012-11-09 (×8): qty 2

## 2012-11-09 MED ORDER — METFORMIN HCL 500 MG PO TABS: 1000.0000 mg | ORAL_TABLET | Freq: Two times a day (BID) | ORAL | Status: DC

## 2012-11-09 MED ORDER — GUAIFENESIN ER 600 MG PO TB12
1200.0000 mg | ORAL_TABLET | Freq: Two times a day (BID) | ORAL | Status: DC
  Administered 2012-11-09 – 2012-11-12 (×8): 1200 mg via ORAL
  Filled 2012-11-09 (×2): qty 2
  Filled 2012-11-09 (×2): qty 1
  Filled 2012-11-09 (×5): qty 2

## 2012-11-09 MED ORDER — LEVALBUTEROL HCL 0.63 MG/3ML IN NEBU
0.6300 mg | INHALATION_SOLUTION | RESPIRATORY_TRACT | Status: DC | PRN
  Administered 2012-11-12: 0.63 mg via RESPIRATORY_TRACT
  Filled 2012-11-09: qty 3

## 2012-11-09 MED ORDER — CLONIDINE HCL 0.2 MG PO TABS
0.2000 mg | ORAL_TABLET | Freq: Three times a day (TID) | ORAL | Status: DC
  Administered 2012-11-09 – 2012-11-12 (×10): 0.2 mg via ORAL
  Filled 2012-11-09 (×10): qty 1

## 2012-11-09 MED ORDER — INSULIN GLARGINE 100 UNIT/ML ~~LOC~~ SOLN
15.0000 [IU] | Freq: Every day | SUBCUTANEOUS | Status: DC
  Administered 2012-11-09 (×2): 15 [IU] via SUBCUTANEOUS

## 2012-11-09 MED ORDER — INSULIN ASPART 100 UNIT/ML ~~LOC~~ SOLN
0.0000 [IU] | Freq: Three times a day (TID) | SUBCUTANEOUS | Status: DC
  Administered 2012-11-09: 2 [IU] via SUBCUTANEOUS
  Administered 2012-11-09: 3 [IU] via SUBCUTANEOUS
  Administered 2012-11-09: 1 [IU] via SUBCUTANEOUS
  Administered 2012-11-10: 2 [IU] via SUBCUTANEOUS

## 2012-11-09 MED ORDER — IPRATROPIUM BROMIDE 0.02 % IN SOLN
0.5000 mg | RESPIRATORY_TRACT | Status: DC | PRN
  Administered 2012-11-12: 0.5 mg via RESPIRATORY_TRACT
  Filled 2012-11-09 (×2): qty 2.5

## 2012-11-09 MED ORDER — METFORMIN HCL 500 MG PO TABS
1000.0000 mg | ORAL_TABLET | Freq: Two times a day (BID) | ORAL | Status: DC
  Administered 2012-11-11 – 2012-11-12 (×3): 1000 mg via ORAL
  Filled 2012-11-09 (×3): qty 2

## 2012-11-09 MED ORDER — ACETAMINOPHEN 500 MG PO TABS: 500.0000 mg | ORAL_TABLET | Freq: Four times a day (QID) | ORAL | Status: DC | PRN

## 2012-11-09 MED ORDER — LEVOFLOXACIN IN D5W 750 MG/150ML IV SOLN
INTRAVENOUS | Status: AC
  Filled 2012-11-09: qty 150

## 2012-11-09 MED ORDER — FUROSEMIDE 40 MG PO TABS
40.0000 mg | ORAL_TABLET | Freq: Every day | ORAL | Status: DC
  Administered 2012-11-09 – 2012-11-12 (×4): 40 mg via ORAL
  Filled 2012-11-09 (×4): qty 1

## 2012-11-09 NOTE — Progress Notes (Signed)
UR Chart Review Completed  

## 2012-11-09 NOTE — H&P (Signed)
[facility] History and Physical  [name]  ZOX:[id number]  DOB: 09-20-1966   DOA: 11/08/2012   PCP:   Syliva Overman, MD   Chief Complaint:  Difficulty breathing  Please refer to the history and physical dictated yesterday by Dr. Elliot Cousin.  HPI: [name] is an 47 y.o. female. Obese African American lady with multiple medical problems listed below, was discharged from the service today after overnight admission for orders of breath. She had been noncompliant with her medication was found to have uncontrolled malignant hypertension, she received IV Lasix help with management of her diastolic heart failure, but is felt to have principally community acquired pneumonia contributing heavily to her shortness of breath. She improved significantly overnight and it was felt her community-acquired pneumonia could be managed at home. She was discharged with prescriptions to continue Levaquin.  Patient has not yet picked up her prescriptions, returns to the emergency room complaining of shortness and wheezing. She was again felt by the emergency room physician to be in heart failure, but it is to be noted that there's been a dramatic fall in her BNP the past 24 hours. She has had no lower extremity edema, she is orthopneic but does not have PND.  She reports wheezing, and coughing; her chest x-ray in the emergency room is unremarkable.  Rewiew of Systems:  She reports no new changes since yesterday, on a complete review of systems.   Past Medical History  Diagnosis Date  . Alcohol use     Now abstinent.  . Diabetes mellitus, type 2   . Obesity   . Hyperlipidemia   . Hypertension   . Anxiety   . Panic attacks   . Hypercholesteremia   . Diabetic Charcot foot 12/30/2011  . RECTAL BLEEDING, HX OF 12/05/2009    Qualifier: Diagnosis of  By: Diana Eves    . Ankle fracture, lateral malleolus, closed 12/30/2011  . Breast mass, left 12/15/2011  . Tobacco abuse   . ANXIETY  04/17/2010    Qualifier: Diagnosis of  By: Garnette Czech PA, Dawn    . Hypertensive cardiomyopathy 11/08/2012    Ejection fraction 40-45%.  . Diastolic dysfunction 11/08/2012    Grade 1.    Past Surgical History  Procedure Date  . Dilation and curettage of uterus     Medications:  HOME MEDS: Prior to Admission medications   Medication Sig Start Date End Date Taking? Authorizing Provider  ALPRAZolam Prudy Feeler) 0.5 MG tablet Take 0.5 mg by mouth 3 (three) times daily as needed.   Yes Historical Provider, MD  amLODipine (NORVASC) 10 MG tablet Take 1 tablet (10 mg total) by mouth daily. 11/02/12 11/02/13 Yes Kerri Perches, MD  benazepril (LOTENSIN) 20 MG tablet Take 1 tablet (20 mg total) by mouth daily. 11/08/12  Yes Elliot Cousin, MD  cloNIDine (CATAPRES) 0.2 MG tablet Take 0.2 mg by mouth 3 (three) times daily.   Yes Historical Provider, MD  FLUoxetine (PROZAC) 10 MG capsule Take 1 capsule (10 mg total) by mouth daily. 11/02/12 11/02/13 Yes Kerri Perches, MD  furosemide (LASIX) 40 MG tablet Take 1 tablet (40 mg total) by mouth daily. "Fluid pill". For your heart. 11/08/12  Yes Elliot Cousin, MD  insulin glargine (LANTUS) 100 UNIT/ML injection Inject 15 Units into the skin at bedtime. Lantus Pen. 11/08/12  Yes Elliot Cousin, MD  metFORMIN (GLUCOPHAGE) 1000 MG tablet Take 1 tablet (1,000 mg total) by mouth 2 (two) times daily with a meal. 11/02/12 11/02/13 Yes  Kerri Perches, MD  potassium chloride (KLOR-CON M15) 15 MEQ tablet Take 2 tablets (30 mEq total) by mouth daily. 11/08/12  Yes Elliot Cousin, MD  pravastatin (PRAVACHOL) 40 MG tablet Take 1 tablet (40 mg total) by mouth every evening. 11/02/12 11/02/13 Yes Kerri Perches, MD  levofloxacin (LEVAQUIN) 500 MG tablet Take 1 tablet (500 mg total) by mouth daily. Antibiotic for 6 more days. 11/08/12   Elliot Cousin, MD  oxyCODONE-acetaminophen (ROXICET) 5-325 MG per tablet Take 1 tablet by mouth every 4 (four) hours as needed for pain. 11/08/12   Elliot Cousin, MD     Allergies:  No Known Allergies  Social History:   reports that she quit smoking 8 days ago. Her smoking use included Cigarettes. She smoked 1 pack per day. She does not have any smokeless tobacco history on file. She reports that she does not drink alcohol or use illicit drugs.  Family History: Family History  Problem Relation Age of Onset  . Hypertension Mother   . Hypertension Father     CABG   . Hypertension Sister   . Heart attack Mother   . Arthritis    . Cancer    . Diabetes    . Asthma       Physical Exam: Filed Vitals:   11/08/12 1914 11/08/12 1953 11/08/12 2219 11/08/12 2350  BP:   164/90 154/110  Pulse:  78 77 78  Temp:   99.6 F (37.6 C) 98.3 F (36.8 C)  TempSrc:   Oral Oral  Resp:   20 20  Height:    5\' 1"  (1.549 m)  Weight:    90.71 kg (199 lb 15.7 oz)  SpO2: 98% 97% 95%    Blood pressure 154/110, pulse 78, temperature 98.3 F (36.8 C), temperature source Oral, resp. rate 20, height 5\' 1"  (1.549 m), weight 90.71 kg (199 lb 15.7 oz), last menstrual period 10/19/2012, SpO2 95.00%.  GEN:  Pleasant African American lady sitting up in the stretcher; audible wheezes present when talk; cooperative with exam PSYCH:  alert and oriented x4; ; affect is appropriate. HEENT: Mucous membranes pink and anicteric; PERRLA; EOM intact; no cervical lymphadenopathy nor thyromegaly or carotid bruit; thick neck Breasts:: Not examined CHEST WALL: No tenderness CHEST: Tachypneic, inspiratory and expiratory rhonchi bilaterally; crackles in the right upper chest anteriorly  HEART: Regular rate and rhythm; no murmurs rubs or gallops BACK: No kyphosis or scoliosis; no CVA tenderness ABDOMEN: Obese, soft non-tender; no masses, no organomegaly, normal abdominal bowel sounds; no pannus; no intertriginous candida. Rectal Exam: Not done EXTREMITIES: ; age-appropriate arthropathy of the hands and knees; no edema; no ulcerations. Genitalia: not examined PULSES: 2+ and  symmetric SKIN: Normal hydration no rash or ulceration CNS: Cranial nerves 2-12 grossly intact no focal lateralizing neurologic deficit   Labs on Admission:  Basic Metabolic Panel:  Lab 11/09/12 59/56/38 2013 11/08/12 0534 11/07/12 1629 11/07/12 1153  NA -- 133* 135 -- 139  K -- 3.3* 3.2* -- 3.2*  CL -- 94* 93* -- 98  CO2 -- 28 32 -- 31  GLUCOSE -- 157* 208* -- 265*  BUN -- 15 11 -- 8  CREATININE -- 0.93 0.94 -- 0.81  CALCIUM -- 9.5 9.5 -- 9.7  MG 1.6 -- -- 1.4* --  PHOS -- -- -- -- --   Liver Function Tests:  Lab 11/08/12 2013 11/08/12 0534  AST 18 13  ALT 13 11  ALKPHOS 87 82  BILITOT 0.8 1.1  PROT 7.0  6.5  ALBUMIN 3.0* 2.7*   No results found for this basename: LIPASE:5,AMYLASE:5 in the last 168 hours No results found for this basename: AMMONIA:5 in the last 168 hours CBC:  Lab 11/08/12 2013 11/08/12 0534 11/07/12 1153  WBC 5.3 4.4 4.9  NEUTROABS 3.8 -- 4.0  HGB 16.3* 15.0 15.8*  HCT 45.9 44.0 45.1  MCV 95.4 96.5 96.6  PLT 156 162 153   Cardiac Enzymes:  Lab 11/08/12 2013 11/08/12 0037 11/07/12 1853 11/07/12 1153  CKTOTAL -- -- -- --  CKMB -- -- -- --  CKMBINDEX -- -- -- --  TROPONINI <0.30 <0.30 <0.30 <0.30   BNP: No components found with this basename: POCBNP:5 D-dimer: No components found with this basename: D-DIMER:5 CBG:  Lab 11/08/12 1130 11/08/12 0750 11/07/12 2152  GLUCAP 225* 158* 209*    Radiological Exams on Admission: Ct Chest W Contrast  11/08/2012  *RADIOLOGY REPORT*  Clinical Data: Shortness of breath for 5 days.  Congestion and tightness in the chest.  CT CHEST WITH CONTRAST  Technique:  Multidetector CT imaging of the chest was performed following the standard protocol during bolus administration of intravenous contrast.  Contrast: OMNIPAQUE IOHEXOL 300 MG/ML  SOLN  Comparison: None.  Findings: Normal heart size.  Normal caliber thoracic aorta. Visualized central pulmonary arteries appear patent.  Right pretracheal lymph nodes are  upper limits of normal size, measuring about 10 mm short axis dimension.  Likely this is reactive.  The esophagus is decompressed.  Small bilateral pleural effusions.  Visualization of the lung parenchyma is limited due to respiratory motion artifact.  Suggestion of fine nodular parenchymal infiltration in the perihilar regions bilaterally.  This suggests edema versus pneumonia.  Mild atelectasis in the lung bases.  No pneumothorax.  Airways appear patent.  Degenerative changes in the thoracic spine.  No destructive bone lesions appreciated.  IMPRESSION: There appears to be patchy nodular perihilar infiltration in both lungs which may represent mild edema or early pneumonia.  Small bilateral pleural effusions.   Original Report Authenticated By: Burman Nieves, M.D.    Dg Chest Port 1 View  11/08/2012  *RADIOLOGY REPORT*  Clinical Data: Shortness of breath, history of pneumonia CHF  PORTABLE CHEST - 1 VIEW  Comparison: 11/07/2012  Findings: Stable cardiomegaly with vascular congestion as before. Minimal basilar atelectasis.  No significant change.  No effusion or pneumothorax.  Trachea midline.  Degenerative changes of the spine.  IMPRESSION: Cardiomegaly with vascular congestion, stable   Original Report Authenticated By: Judie Petit. Shick, M.D.    Dg Chest Portable 1 View  11/07/2012  *RADIOLOGY REPORT*  Clinical Data: Shortness of breath, cough, hypertension, diabetes  PORTABLE CHEST - 1 VIEW  Comparison: Portable exam 1211 hours compared to 04/08/2011  Findings: Enlargement of cardiac silhouette with pulmonary vascular congestion. Tortuous aorta. Mild atelectasis versus early infiltrate at right lung base. Remaining lungs grossly clear. No pleural effusion or pneumothorax. Bones unremarkable.  IMPRESSION: Enlargement of cardiac silhouette with pulmonary vascular congestion. Mild atelectasis versus early infiltrate at right lung base.   Original Report Authenticated By: Ulyses Southward, M.D.      Assessment/Plan Present on Admission:   Community-acquired pneumonia Acute bronchitis . Respiratory distress, due to the above  . Acute respiratory failure, due to the above . HTN (hypertension), malignant . CHF (congestive heart failure) . DM (diabetes mellitus), type 2, uncontrolled . HYPERLIPIDEMIA . OBESITY  . ANXIETY . DEPRESSION . NICOTINE ADDICTION . Hypertensive cardiomyopathy . Hypokalemia   PLAN: We'll admit this lady for  respiratory support including oxygen, nebulizer, antibiotics, nicotine replacement. Not give Solu-Medrol because of her diabetes, unless her bronchospasm fails to improve on initial regimen. CT scan chest to evaluate source of crackles.  Continue home and to the diabetic regimen and add a sliding scale  Continual home dose of oh furosemide; her CHF appears to be compensated; will not give her IV fluids. Will not give beta blockers while wheezing  Replete potassium.  When necessary hydralazine to improve blood pressure control   Other plans as per orders.  Code Status: FULL CODE   Disposition Plan: Depending on response to current therapy   [name] Nocturnist [facility] Pager [phone]   11/09/2012, 12:26 AM

## 2012-11-09 NOTE — Progress Notes (Signed)
ANTIBIOTIC CONSULT NOTE - INITIAL  Pharmacy Consult for Levaquin, adjust ABX for renal fxn  Indication: CAP  No Known Allergies  Patient Measurements: Height: 5\' 1"  (154.9 cm) Weight: 199 lb 15.7 oz (90.71 kg) IBW/kg (Calculated) : 47.8   Vital Signs: Temp: 97.9 F (36.6 C) (01/15 1109) Temp src: Oral (01/15 1109) BP: 153/110 mmHg (01/15 1109) Pulse Rate: 86  (01/15 1109) Intake/Output from previous day:   Intake/Output from this shift: Total I/O In: 120 [P.O.:120] Out: -   Labs:  Basename 11/09/12 0642 11/08/12 2013 11/08/12 0534  WBC 5.5 5.3 4.4  HGB 16.0* 16.3* 15.0  PLT 168 156 162  LABCREA -- -- --  CREATININE 0.95 0.93 0.94   Estimated Creatinine Clearance: 74.3 ml/min (by C-G formula based on Cr of 0.95). No results found for this basename: VANCOTROUGH:2,VANCOPEAK:2,VANCORANDOM:2,GENTTROUGH:2,GENTPEAK:2,GENTRANDOM:2,TOBRATROUGH:2,TOBRAPEAK:2,TOBRARND:2,AMIKACINPEAK:2,AMIKACINTROU:2,AMIKACIN:2, in the last 72 hours   Microbiology: No results found for this or any previous visit (from the past 720 hour(s)).  Medical History: Past Medical History  Diagnosis Date  . Alcohol use     Now abstinent.  . Diabetes mellitus, type 2   . Obesity   . Hyperlipidemia   . Hypertension   . Anxiety   . Panic attacks   . Hypercholesteremia   . Diabetic Charcot foot 12/30/2011  . RECTAL BLEEDING, HX OF 12/05/2009    Qualifier: Diagnosis of  By: Diana Eves    . Ankle fracture, lateral malleolus, closed 12/30/2011  . Breast mass, left 12/15/2011  . Tobacco abuse   . ANXIETY 04/17/2010    Qualifier: Diagnosis of  By: Garnette Czech PA, Dawn    . Hypertensive cardiomyopathy 11/08/2012    Ejection fraction 40-45%.  . Diastolic dysfunction 11/08/2012    Grade 1.    Medications:  Scheduled:    . [COMPLETED] albuterol  2.5 mg Nebulization Once  . ALPRAZolam  0.5 mg Oral TID  . amLODipine  10 mg Oral Daily  . benazepril  20 mg Oral Daily  . cloNIDine  0.2 mg Oral TID  .  enoxaparin (LOVENOX) injection  40 mg Subcutaneous Q24H  . FLUoxetine  10 mg Oral Daily  . [COMPLETED] furosemide  40 mg Intravenous Once  . furosemide  40 mg Oral Daily  . guaiFENesin  1,200 mg Oral BID  . [COMPLETED] guaiFENesin  5 mL Oral Once  . [COMPLETED] HYDROcodone-acetaminophen  1 tablet Oral Once  . insulin aspart  0-5 Units Subcutaneous QHS  . insulin aspart  0-9 Units Subcutaneous TID WC  . insulin glargine  15 Units Subcutaneous QHS  . [COMPLETED] ipratropium  0.5 mg Nebulization Once  . ipratropium  0.5 mg Nebulization Q6H  . levalbuterol  0.63 mg Nebulization Q6H  . levofloxacin (LEVAQUIN) IV  750 mg Intravenous Q24H  . metFORMIN  1,000 mg Oral BID WC  . [COMPLETED] potassium chloride  40 mEq Oral Q4H   Followed by  . potassium chloride  30 mEq Oral Daily  . simvastatin  20 mg Oral q1800  . sodium chloride  3 mL Intravenous Q12H  . [DISCONTINUED] metFORMIN  1,000 mg Oral BID WC   Assessment: OK for protocol.  Good renal fxn.  Estimated Creatinine Clearance: 74.3 ml/min (by C-G formula based on Cr of 0.95).  Pt failed OP treatment so admitted for IV therapy of CAP.  Goal of Therapy: Eradicate infection.  Plan: Continue Levaquin 750mg  IV q24hrs Monitor labs, renal fxn, and cultures per protocol  Valrie Hart A 11/09/2012,11:44 AM

## 2012-11-09 NOTE — Progress Notes (Signed)
Subjective: The patient reports that she became more short of breath and with more chest congestion after she was discharged to home yesterday. This morning, she is breathing a little better, but complains of easy fatigability and shortness of breath with small amounts of activity..  Objective: Vital signs in last 24 hours: Filed Vitals:   11/09/12 0018 11/09/12 0402 11/09/12 0452 11/09/12 0702  BP:   158/99   Pulse:   67   Temp:   98.1 F (36.7 C)   TempSrc:   Oral   Resp:   20   Height:      Weight:      SpO2: 100% 100% 98% 98%    Intake/Output Summary (Last 24 hours) at 11/09/12 1026 Last data filed at 11/09/12 0810  Gross per 24 hour  Intake    120 ml  Output      0 ml  Net    120 ml    Weight change:   Physical exam: General: Obese 47 year old African-American woman laying in bed, in no acute distress. Lungs: A few fine crackles auscultated bilaterally, but no significant rhonchi or wheezes. Heart: S1, S2, with a soft systolic murmur. Abdomen: Obese, positive bowel sounds, soft, nontender, nondistended. Extremities: No pedal edema.  Lab Results: Basic Metabolic Panel:  Basename 11/09/12 0642 11/09/12 11/08/12 2013 11/07/12 1629  NA 134* -- 133* --  K 4.0 -- 3.3* --  CL 95* -- 94* --  CO2 28 -- 28 --  GLUCOSE 145* -- 157* --  BUN 14 -- 15 --  CREATININE 0.95 -- 0.93 --  CALCIUM 9.3 -- 9.5 --  MG -- 1.6 -- 1.4*  PHOS -- -- -- --   Liver Function Tests:  [facility] 11/09/12 0642 11/08/12 2013  AST 24 18  ALT 13 13  ALKPHOS 84 87  BILITOT 0.8 0.8  PROT 6.9 7.0  ALBUMIN 2.9* 3.0*   No results found for this basename: LIPASE:2,AMYLASE:2 in the last 72 hours No results found for this basename: AMMONIA:2 in the last 72 hours CBC:  Basename 11/09/12 0642 11/08/12 2013 11/07/12 1153  WBC 5.5 5.3 --  NEUTROABS -- 3.8 4.0  HGB 16.0* 16.3* --  HCT 45.6 45.9 --  MCV 96.0 95.4 --  PLT 168 156 --   Cardiac Enzymes:  Basename 11/08/12 2013 11/08/12 0037  11/07/12 1853  CKTOTAL -- -- --  CKMB -- -- --  CKMBINDEX -- -- --  TROPONINI <0.30 <0.30 <0.30   BNP:  [facility] 11/08/12 2013 11/07/12 1153  PROBNP 725.3* 3256.0*   D-Dimer: No results found for this basename: DDIMER:2 in the last 72 hours CBG:  Basename 11/09/12 0726 11/09/12 0052 11/08/12 1130 11/08/12 0750 11/07/12 2152  GLUCAP 144* 148* 225* 158* 209*   Hemoglobin A1C:  Basename 11/07/12 1629  HGBA1C 9.6*   Fasting Lipid Panel:  Basename 11/08/12 0534  CHOL 224*  HDL 50  LDLCALC 137*  TRIG 184*  CHOLHDL 4.5  LDLDIRECT --   Thyroid Function Tests:  Basename 11/07/12 1629  TSH 1.379  T4TOTAL --  FREET4 --  T3FREE --  THYROIDAB --   Anemia Panel: No results found for this basename: VITAMINB12,FOLATE,FERRITIN,TIBC,IRON,RETICCTPCT in the last 72 hours Coagulation:  Basename 11/07/12 1629  LABPROT 12.7  INR 0.96   Urine Drug Screen: Drugs of Abuse  No results found for this basename: labopia, cocainscrnur, labbenz, amphetmu, thcu, labbarb    Alcohol Level: No results found for this basename: ETH:2 in the last 72 hours Urinalysis: No  results found for this basename: COLORURINE:2,APPERANCEUR:2,LABSPEC:2,PHURINE:2,GLUCOSEU:2,HGBUR:2,BILIRUBINUR:2,KETONESUR:2,PROTEINUR:2,UROBILINOGEN:2,NITRITE:2,LEUKOCYTESUR:2 in the last 72 hours Misc. Labs:   Micro: No results found for this or any previous visit (from the past 240 hour(s)).  Studies/Results: Ct Chest W Contrast  11/08/2012  *RADIOLOGY REPORT*  Clinical Data: Shortness of breath for 5 days.  Congestion and tightness in the chest.  CT CHEST WITH CONTRAST  Technique:  Multidetector CT imaging of the chest was performed following the standard protocol during bolus administration of intravenous contrast.  Contrast: OMNIPAQUE IOHEXOL 300 MG/ML  SOLN  Comparison: None.  Findings: Normal heart size.  Normal caliber thoracic aorta. Visualized central pulmonary arteries appear patent.  Right pretracheal  lymph nodes are upper limits of normal size, measuring about 10 mm short axis dimension.  Likely this is reactive.  The esophagus is decompressed.  Small bilateral pleural effusions.  Visualization of the lung parenchyma is limited due to respiratory motion artifact.  Suggestion of fine nodular parenchymal infiltration in the perihilar regions bilaterally.  This suggests edema versus pneumonia.  Mild atelectasis in the lung bases.  No pneumothorax.  Airways appear patent.  Degenerative changes in the thoracic spine.  No destructive bone lesions appreciated.  IMPRESSION: There appears to be patchy nodular perihilar infiltration in both lungs which may represent mild edema or early pneumonia.  Small bilateral pleural effusions.   Original Report Authenticated By: Burman Nieves, M.D.    Dg Chest Port 1 View  11/08/2012  *RADIOLOGY REPORT*  Clinical Data: Shortness of breath, history of pneumonia CHF  PORTABLE CHEST - 1 VIEW  Comparison: 11/07/2012  Findings: Stable cardiomegaly with vascular congestion as before. Minimal basilar atelectasis.  No significant change.  No effusion or pneumothorax.  Trachea midline.  Degenerative changes of the spine.  IMPRESSION: Cardiomegaly with vascular congestion, stable   Original Report Authenticated By: Judie Petit. Shick, M.D.    Dg Chest Portable 1 View  11/07/2012  *RADIOLOGY REPORT*  Clinical Data: Shortness of breath, cough, hypertension, diabetes  PORTABLE CHEST - 1 VIEW  Comparison: Portable exam 1211 hours compared to 04/08/2011  Findings: Enlargement of cardiac silhouette with pulmonary vascular congestion. Tortuous aorta. Mild atelectasis versus early infiltrate at right lung base. Remaining lungs grossly clear. No pleural effusion or pneumothorax. Bones unremarkable.  IMPRESSION: Enlargement of cardiac silhouette with pulmonary vascular congestion. Mild atelectasis versus early infiltrate at right lung base.   Original Report Authenticated By: Ulyses Southward, M.D.      Medications:  Scheduled:   . amLODipine  10 mg Oral Daily  . benazepril  20 mg Oral Daily  . cloNIDine  0.2 mg Oral TID  . enoxaparin (LOVENOX) injection  40 mg Subcutaneous Q24H  . FLUoxetine  10 mg Oral Daily  . furosemide  40 mg Oral Daily  . guaiFENesin  1,200 mg Oral BID  . insulin aspart  0-5 Units Subcutaneous QHS  . insulin aspart  0-9 Units Subcutaneous TID WC  . insulin glargine  15 Units Subcutaneous QHS  . ipratropium  0.5 mg Nebulization Q6H  . levalbuterol  0.63 mg Nebulization Q6H  . levofloxacin (LEVAQUIN) IV  750 mg Intravenous Q24H  . metFORMIN  1,000 mg Oral BID WC  . potassium chloride  30 mEq Oral Daily  . simvastatin  20 mg Oral q1800  . sodium chloride  3 mL Intravenous Q12H   Continuous:  ZOX:WRUEAV chloride, acetaminophen, ALPRAZolam, benzonatate, hydrALAZINE, ipratropium, levalbuterol, ondansetron (ZOFRAN) IV, oxyCODONE-acetaminophen, sodium chloride, sodium phosphate, sorbitol  Assessment: Principal Problem:  *Acute respiratory failure Active Problems:  DM (diabetes mellitus), type 2, uncontrolled  HYPERLIPIDEMIA  OBESITY  ANXIETY  DEPRESSION  NICOTINE ADDICTION  CAP (community acquired pneumonia)  HTN (hypertension), malignant  Hypokalemia  Hypertensive cardiomyopathy  CHF (congestive heart failure)  Respiratory distress  Acute bronchitis   (See discharge summary from yesterday).  1. Acute respiratory failure with hypoxia, secondary to community-acquired pneumonia and acute bronchitis. The patient was discharged on Levaquin during the previous hospitalization. Levaquin was restarted appropriately. We'll continue and Atrovent and Xopenex nebulizers. Continue guaifenesin and as needed Tessalon Perles. Continue oxygen titrated to keep rock his saturations greater than 90%.  Hypertensive cardiomyopathy/diastolic dysfunction/mild systolic dysfunction.. Her ejection fraction is 45-50%. The patient was given IV Lasix in the emergency  department. However, she has no obvious evidence of acute congestive heart failure as I stated during the previous hospitalization. We'll continue oral Lasix and other cardiac medications.  Malignant hypertension. Overall improved. Continue antihypertensive medications.  Hypokalemia. Likely secondary to Lasix. Continue daily supplementation of potassium chloride.  Type 2 diabetes mellitus. Continue metformin. Continue sliding scale NovoLog and Lantus.  Hyperlipidemia. Continue pravastatin.  Depression/anxiety. Will restart Xanax and Prozac.  History of tobacco abuse. The patient recently quit.  Noncompliance. Compliance was encouraged her conversation during the last hospitalization.   Plan:  1. Continue medical management as above. It is obvious that the patient needs more than 24 hours of hospitalization for treatment of community-acquired pneumonia. 2. Adjust medication therapy as her clinical course changes.    LOS: 1 day   [name] 11/09/2012, 10:26 AM

## 2012-11-09 NOTE — Care Management Note (Signed)
    Page 1 of 1   11/11/2012     2:31:03 PM   CARE MANAGEMENT NOTE 11/11/2012  Patient:  EATHER, CHAIRES   Account Number:  [id number]  Date Initiated:  11/09/2012  Documentation initiated by:  Rosemary Holms  Subjective/Objective Assessment:   Pt just DC'd yesterday and returned with wheezing and readmitted. Lives alone, PCS aide 1.5 hr/d. Active with [facility] for RN     Action/Plan:   Anticipated DC Date:  11/11/2012   Anticipated DC Plan:  HOME W HOME HEALTH SERVICES      DC Planning Services  CM consult      Choice offered to / List presented to:             Status of service:  Completed, signed off Medicare Important Message given?  YES (If response is "NO", the following Medicare IM given date fields will be blank) Date Medicare IM given:  11/10/2012 Date Additional Medicare IM given:    Discharge Disposition:  HOME/SELF CARE  Per UR Regulation:    If discussed at Long Length of Stay Meetings, dates discussed:    Comments:  11/11/12 Rosemary Holms RN BSN CM Pt declined PT [facility]  11/09/12 Rome Schlauch RN BSN CM

## 2012-11-10 ENCOUNTER — Ambulatory Visit (HOSPITAL_COMMUNITY): Payer: PRIVATE HEALTH INSURANCE | Admitting: Psychiatry

## 2012-11-10 DIAGNOSIS — I509 Heart failure, unspecified: Secondary | ICD-10-CM

## 2012-11-10 DIAGNOSIS — E119 Type 2 diabetes mellitus without complications: Secondary | ICD-10-CM

## 2012-11-10 LAB — GLUCOSE, CAPILLARY
Glucose-Capillary: 183 mg/dL — ABNORMAL HIGH (ref 70–99)
Glucose-Capillary: 193 mg/dL — ABNORMAL HIGH (ref 70–99)
Glucose-Capillary: 195 mg/dL — ABNORMAL HIGH (ref 70–99)
Glucose-Capillary: 212 mg/dL — ABNORMAL HIGH (ref 70–99)
Glucose-Capillary: 236 mg/dL — ABNORMAL HIGH (ref 70–99)
Glucose-Capillary: 409 mg/dL — ABNORMAL HIGH (ref 70–99)
Glucose-Capillary: 446 mg/dL — ABNORMAL HIGH (ref 70–99)

## 2012-11-10 LAB — BASIC METABOLIC PANEL
BUN: 17 mg/dL (ref 6–23)
CO2: 25 mEq/L (ref 19–32)
Calcium: 8.9 mg/dL (ref 8.4–10.5)
Chloride: 100 mEq/L (ref 96–112)
Creatinine, Ser: 0.79 mg/dL (ref 0.50–1.10)
GFR calc Af Amer: >90 mL/min (ref >90)
GFR calc non Af Amer: >90 mL/min (ref >90)
Glucose, Bld: 228 mg/dL — ABNORMAL HIGH (ref 70–99)
Potassium: 4.3 mEq/L (ref 3.5–5.1)
Sodium: 134 mEq/L — ABNORMAL LOW (ref 135–145)

## 2012-11-10 LAB — CBC
HCT: 45.6 % (ref 36.0–46.0)
Hemoglobin: 15.6 g/dL — ABNORMAL HIGH (ref 12.0–15.0)
MCH: 33.4 pg (ref 26.0–34.0)
MCHC: 34.2 g/dL (ref 30.0–36.0)
MCV: 97.6 fL (ref 78.0–100.0)
Platelets: 174 10*3/uL (ref 150–400)
RBC: 4.67 MIL/uL (ref 3.87–5.11)
RDW: 13.3 % (ref 11.5–15.5)
WBC: 4.5 10*3/uL (ref 4.0–10.5)

## 2012-11-10 LAB — LEGIONELLA ANTIGEN, URINE: Legionella Antigen, Urine: NEGATIVE

## 2012-11-10 LAB — GLUCOSE, RANDOM: Glucose, Bld: 454 mg/dL — ABNORMAL HIGH (ref 70–99)

## 2012-11-10 MED ORDER — BIOTENE DRY MOUTH MT LIQD
15.0000 mL | Freq: Two times a day (BID) | OROMUCOSAL | Status: DC
  Administered 2012-11-10 – 2012-11-12 (×5): 15 mL via OROMUCOSAL

## 2012-11-10 MED ORDER — MAGNESIUM HYDROXIDE 400 MG/5ML PO SUSP
15.0000 mL | Freq: Every day | ORAL | Status: DC | PRN
  Administered 2012-11-10 – 2012-11-11 (×2): 15 mL via ORAL
  Filled 2012-11-10 (×2): qty 30

## 2012-11-10 MED ORDER — INSULIN ASPART 100 UNIT/ML ~~LOC~~ SOLN
0.0000 [IU] | Freq: Three times a day (TID) | SUBCUTANEOUS | Status: DC
  Administered 2012-11-10: 7 [IU] via SUBCUTANEOUS
  Administered 2012-11-10: 4 [IU] via SUBCUTANEOUS
  Administered 2012-11-11: 15 [IU] via SUBCUTANEOUS

## 2012-11-10 MED ORDER — CARVEDILOL 3.125 MG PO TABS
6.2500 mg | ORAL_TABLET | Freq: Two times a day (BID) | ORAL | Status: DC
  Administered 2012-11-10 – 2012-11-12 (×4): 6.25 mg via ORAL
  Filled 2012-11-10 (×4): qty 2

## 2012-11-10 MED ORDER — ASPIRIN EC 81 MG PO TBEC
81.0000 mg | DELAYED_RELEASE_TABLET | Freq: Every day | ORAL | Status: DC
  Administered 2012-11-11 – 2012-11-12 (×2): 81 mg via ORAL
  Filled 2012-11-10 (×2): qty 1

## 2012-11-10 MED ORDER — PREDNISONE 20 MG PO TABS
50.0000 mg | ORAL_TABLET | Freq: Every day | ORAL | Status: DC
  Administered 2012-11-10: 50 mg via ORAL
  Filled 2012-11-10: qty 2

## 2012-11-10 MED ORDER — INSULIN ASPART 100 UNIT/ML ~~LOC~~ SOLN
10.0000 [IU] | Freq: Once | SUBCUTANEOUS | Status: AC
  Administered 2012-11-10: 10 [IU] via SUBCUTANEOUS

## 2012-11-10 MED ORDER — FUROSEMIDE 10 MG/ML IJ SOLN
60.0000 mg | Freq: Once | INTRAMUSCULAR | Status: AC
  Administered 2012-11-10: 60 mg via INTRAVENOUS
  Filled 2012-11-10: qty 6

## 2012-11-10 MED ORDER — BENAZEPRIL HCL 10 MG PO TABS
40.0000 mg | ORAL_TABLET | Freq: Every day | ORAL | Status: DC
  Administered 2012-11-11 – 2012-11-12 (×2): 40 mg via ORAL
  Filled 2012-11-10 (×2): qty 4

## 2012-11-10 MED ORDER — INSULIN GLARGINE 100 UNIT/ML ~~LOC~~ SOLN: 20.0000 [IU] | Freq: Every day | SUBCUTANEOUS | Status: DC

## 2012-11-10 MED ORDER — NICOTINE 21 MG/24HR TD PT24
21.0000 mg | MEDICATED_PATCH | Freq: Every day | TRANSDERMAL | Status: DC
  Administered 2012-11-10 – 2012-11-12 (×4): 21 mg via TRANSDERMAL
  Filled 2012-11-10 (×4): qty 1

## 2012-11-10 MED ORDER — MUSCLE RUB 10-15 % EX CREA
TOPICAL_CREAM | CUTANEOUS | Status: DC | PRN
  Administered 2012-11-10: 21:00:00 via TOPICAL
  Filled 2012-11-10: qty 85

## 2012-11-10 MED ORDER — FUROSEMIDE 10 MG/ML IJ SOLN
20.0000 mg | Freq: Once | INTRAMUSCULAR | Status: AC
  Administered 2012-11-10: 20 mg via INTRAVENOUS
  Filled 2012-11-10: qty 2

## 2012-11-10 NOTE — Clinical Social Work Note (Signed)
Pt had court date this morning and requesting letter be faxed stating she is in hospital. CSW completed letter and faxed to courthouse. Letter provided to pt and copy is on chart.   Derenda Fennel, [place] [phone]

## 2012-11-10 NOTE — Progress Notes (Signed)
Called by evening RN re: hyperglycemia and persistent hypertension.  Plan: 1. Novolog 10 units X1, needs meal coverage 2. Increased Lantus to 20 units qhs 3. Increased Benazepril to 40 daily 4. Reviewed 2D echo, evidence of systolic heart failure so added Coreg 6.25 BID (may need addition of Imdur or hydralazine but with her non-adherence issues hydralazine would be difficult). 5. Still may be volume overloaded, gave additional dose of IV lasix, CXR planned for in AM and repeat BNP. BMET to monitor K. 6. Added daily ASA 81 given DM and CAD risk factors.  Edsel Petrin, DO Internal Medicine

## 2012-11-10 NOTE — Progress Notes (Signed)
Subjective: Patients report she is still feeling under the weather. Still somewhat short of breath and coughing frequently. She complains of epigastric pain that she associates with coughing. Complains of right upper thigh pain that seems musculoskeletal in nature. Also tells me about her recent rotator cuff therapy that she is undergoing and a chronic wound that is healing on her left tibial area but is still painful.  Furthermore, she mentions her last bowel movement was Saturday and that she needs a laxative.   Objective: last 24 hours: Filed Vitals:   11/09/12 2137 11/10/12 0500 11/10/12 0701 11/10/12 0729  BP: 150/115  148/102   Pulse: 79  67   Temp: 98 F (36.7 C)  97.6 F (36.4 C)   TempSrc: Oral  Oral   Resp: 20  20   Height:      Weight:  87.907 kg (193 lb 12.8 oz)    SpO2: 96%  69% 99%    Intake/Output Summary (Last 24 hours) at 11/10/12 1028 Last data filed at 11/10/12 1000  Gross per 24 hour  Intake   1201 ml  Output   2300 ml  Net  -1099 ml    Weight change: -2.812 kg (-6 lb 3.2 oz)  Physical exam: General: Obese 47 year old African-American woman laying in bed, in no acute distress. Lungs: Deep sounding wheeze on the the right, A few fine crackles auscultated on the left.  Heart: S1, S2, with a soft systolic murmur. Abdomen: Obese, positive bowel sounds, soft, nontender, nondistended. Extremities: No pedal edema.  right upper thigh with no edema, erythema or tenderness to palpation.  Left tibal shows darkened patch consistent with healing wound (no broken skin, erythema or edema)  Lab Results: Basic Metabolic Panel:  Basename 11/10/12 0542 11/09/12 0642 11/09/12 11/07/12 1629  NA 134* 134* -- --  K 4.3 4.0 -- --  CL 100 95* -- --  CO2 25 28 -- --  GLUCOSE 228* 145* -- --  BUN 17 14 -- --  CREATININE 0.79 0.95 -- --  CALCIUM 8.9 9.3 -- --  MG -- -- 1.6 1.4*  PHOS -- -- -- --   Liver Function Tests:  [facility] 11/09/12 0642 11/08/12 2013  AST 24 18    ALT 13 13  ALKPHOS 84 87  BILITOT 0.8 0.8  PROT 6.9 7.0  ALBUMIN 2.9* 3.0*   CBC:  Basename 11/10/12 0542 11/09/12 0642 11/08/12 2013 11/07/12 1153  WBC 4.5 5.5 -- --  NEUTROABS -- -- 3.8 4.0  HGB 15.6* 16.0* -- --  HCT 45.6 45.6 -- --  MCV 97.6 96.0 -- --  PLT 174 168 -- --   Cardiac Enzymes:  Basename 11/08/12 2013 11/08/12 0037 11/07/12 1853  CKTOTAL -- -- --  CKMB -- -- --  CKMBINDEX -- -- --  TROPONINI <0.30 <0.30 <0.30   BNP:  Basename 11/08/12 2013 11/07/12 1153  PROBNP 725.3* 3256.0*   CBG:  Basename 11/10/12 0727 11/09/12 2144 11/09/12 1644 11/09/12 1109 11/09/12 0726 11/09/12 0052  GLUCAP 183* 212* 195* 220* 144* 148*   Hemoglobin A1C:  Basename 11/07/12 1629  HGBA1C 9.6*   Fasting Lipid Panel:  Basename 11/08/12 0534  CHOL 224*  HDL 50  LDLCALC 137*  TRIG 184*  CHOLHDL 4.5  LDLDIRECT --   Thyroid Function Tests:  Basename 11/07/12 1629  TSH 1.379  T4TOTAL --  FREET4 --  T3FREE --  THYROIDAB --   Coagulation:  Basename 11/07/12 1629  LABPROT 12.7  INR 0.96   Misc.  Labs:   Micro: Recent Results (from the past 240 hour(s))  CULTURE, EXPECTORATED SPUTUM-ASSESSMENT     Status: Normal   Collection Time   11/09/12  5:45 PM      Component Value Range Status Comment   Specimen Description SPUTUM EXPECTORATED   Final    Special Requests NONE   Final    Sputum evaluation     Final    Value: THIS SPECIMEN IS ACCEPTABLE. RESPIRATORY CULTURE REPORT TO FOLLOW.     Performed at [facility]   Report Status 11/09/2012 FINAL   Final   CULTURE, RESPIRATORY     Status: Normal (Preliminary result)   Collection Time   11/09/12  5:45 PM      Component Value Range Status Comment   Specimen Description SPUTUM EXPECTORATED   Final    Special Requests NONE   Final    Gram Stain     Final    Value: ABUNDANT WBC PRESENT,BOTH PMN AND MONONUCLEAR     ABUNDANT SQUAMOUS EPITHELIAL CELLS PRESENT     MODERATE GRAM POSITIVE COCCI IN PAIRS     FEW  YEAST   Culture PENDING   Incomplete    Report Status PENDING   Incomplete     Studies/Results: Ct Chest W Contrast  11/08/2012  *RADIOLOGY REPORT*  Clinical Data: Shortness of breath for 5 days.  Congestion and tightness in the chest.  CT CHEST WITH CONTRAST  Technique:  Multidetector CT imaging of the chest was performed following the standard protocol during bolus administration of intravenous contrast.  Contrast: OMNIPAQUE IOHEXOL 300 MG/ML  SOLN  Comparison: None.  Findings: Normal heart size.  Normal caliber thoracic aorta. Visualized central pulmonary arteries appear patent.  Right pretracheal lymph nodes are upper limits of normal size, measuring about 10 mm short axis dimension.  Likely this is reactive.  The esophagus is decompressed.  Small bilateral pleural effusions.  Visualization of the lung parenchyma is limited due to respiratory motion artifact.  Suggestion of fine nodular parenchymal infiltration in the perihilar regions bilaterally.  This suggests edema versus pneumonia.  Mild atelectasis in the lung bases.  No pneumothorax.  Airways appear patent.  Degenerative changes in the thoracic spine.  No destructive bone lesions appreciated.  IMPRESSION: There appears to be patchy nodular perihilar infiltration in both lungs which may represent mild edema or early pneumonia.  Small bilateral pleural effusions.   Original Report Authenticated By: Burman Nieves, M.D.    Dg Chest Port 1 View  11/08/2012  *RADIOLOGY REPORT*  Clinical Data: Shortness of breath, history of pneumonia CHF  PORTABLE CHEST - 1 VIEW  Comparison: 11/07/2012  Findings: Stable cardiomegaly with vascular congestion as before. Minimal basilar atelectasis.  No significant change.  No effusion or pneumothorax.  Trachea midline.  Degenerative changes of the spine.  IMPRESSION: Cardiomegaly with vascular congestion, stable   Original Report Authenticated By: Judie Petit. Miles Costain, M.D.     Medications:  Scheduled:    .  ALPRAZolam  0.5 mg Oral TID  . amLODipine  10 mg Oral Daily  . antiseptic oral rinse  15 mL Mouth Rinse BID  . benazepril  20 mg Oral Daily  . cloNIDine  0.2 mg Oral TID  . enoxaparin (LOVENOX) injection  40 mg Subcutaneous Q24H  . FLUoxetine  10 mg Oral Daily  . furosemide  40 mg Oral Daily  . guaiFENesin  1,200 mg Oral BID  . insulin aspart  0-5 Units Subcutaneous QHS  . insulin aspart  0-9 Units Subcutaneous TID WC  . insulin glargine  15 Units Subcutaneous QHS  . ipratropium  0.5 mg Nebulization Q6H  . levalbuterol  0.63 mg Nebulization Q6H  . levofloxacin (LEVAQUIN) IV  750 mg Intravenous Q24H  . metFORMIN  1,000 mg Oral BID WC  . potassium chloride  30 mEq Oral Daily  . predniSONE  50 mg Oral Q1200  . simvastatin  20 mg Oral q1800  . sodium chloride  3 mL Intravenous Q12H   Continuous:  WUJ:WJXBJY chloride, acetaminophen, benzonatate, hydrALAZINE, ipratropium, levalbuterol, magnesium hydroxide, ondansetron (ZOFRAN) IV, oxyCODONE-acetaminophen, sodium chloride, sodium phosphate, sorbitol  Assessment: Principal Problem:  *Acute respiratory failure Active Problems:  DM (diabetes mellitus), type 2, uncontrolled  HYPERLIPIDEMIA  OBESITY  ANXIETY  DEPRESSION  NICOTINE ADDICTION  CAP (community acquired pneumonia)  HTN (hypertension), malignant  Hypokalemia  Hypertensive cardiomyopathy  CHF (congestive heart failure)  Respiratory distress  Acute bronchitis   (See discharge summary from 1/15).  1. Acute respiratory failure with hypoxia, secondary to community-acquired pneumonia and acute bronchitis. The patient was discharged on Levaquin during the previous hospitalization. Levaquin was restarted appropriately.  Adding a very short course of prednisone due to wheeze 1/16.  (The patient prefers not to be on steroids due to weight gain). We'll continue and Atrovent and Xopenex nebulizers. Continue guaifenesin and as needed Tessalon Perles. Continue oxygen titrated to keep  oxygen saturations greater than 90%.  Hypertensive cardiomyopathy/diastolic dysfunction/mild systolic dysfunction.. Her ejection fraction is 45-50% (1/14) grade 1 diastolic dysfunction. The patient was given IV Lasix in the emergency department. However, she has no obvious clinical evidence of acute congestive heart failure.  We'll continue oral Lasix and other cardiac medications.  TSH 1.379 on 11/07/12  Malignant hypertension. Overall stable. Continue antihypertensive medications.  Hypokalemia. Likely secondary to Lasix. Continue daily supplementation of potassium chloride.  Type 2 diabetes mellitus. Continue metformin. Continue sliding scale NovoLog and Lantus.  Increased SSI to resistant given short course of prednisone.  Hyperlipidemia. Continue pravastatin.  Depression/anxiety. Will restart Xanax and Prozac.  History of tobacco abuse. The patient recently quit.  Noncompliance. Compliance was encouraged her conversation during the last hospitalization.    LOS: 2 days   Conley Canal [phone] 11/10/2012, 10:28 AM   Attending note:  Patient seen and examined. Note above reviewed.  Agree with starting short course of steroids. Repeat xray tomorrow morning. Repeat bnp today.  Give one dose of iv lasix today.

## 2012-11-11 ENCOUNTER — Inpatient Hospital Stay (HOSPITAL_COMMUNITY): Payer: PRIVATE HEALTH INSURANCE

## 2012-11-11 ENCOUNTER — Ambulatory Visit (HOSPITAL_COMMUNITY): Payer: PRIVATE HEALTH INSURANCE | Admitting: Specialist

## 2012-11-11 LAB — GLUCOSE, CAPILLARY
Glucose-Capillary: 148 mg/dL — ABNORMAL HIGH (ref 70–99)
Glucose-Capillary: 154 mg/dL — ABNORMAL HIGH (ref 70–99)
Glucose-Capillary: 177 mg/dL — ABNORMAL HIGH (ref 70–99)
Glucose-Capillary: 308 mg/dL — ABNORMAL HIGH (ref 70–99)
Glucose-Capillary: 373 mg/dL — ABNORMAL HIGH (ref 70–99)

## 2012-11-11 LAB — PRO B NATRIURETIC PEPTIDE: Pro B Natriuretic peptide (BNP): 864.1 pg/mL — ABNORMAL HIGH (ref 0–125)

## 2012-11-11 LAB — BASIC METABOLIC PANEL
BUN: 24 mg/dL — ABNORMAL HIGH (ref 6–23)
CO2: 25 mEq/L (ref 19–32)
Calcium: 10 mg/dL (ref 8.4–10.5)
Chloride: 94 mEq/L — ABNORMAL LOW (ref 96–112)
Creatinine, Ser: 0.89 mg/dL (ref 0.50–1.10)
GFR calc Af Amer: 87 mL/min — ABNORMAL LOW (ref >90)
GFR calc non Af Amer: 75 mL/min — ABNORMAL LOW (ref >90)
Glucose, Bld: 311 mg/dL — ABNORMAL HIGH (ref 70–99)
Potassium: 4.3 mEq/L (ref 3.5–5.1)
Sodium: 129 mEq/L — ABNORMAL LOW (ref 135–145)

## 2012-11-11 MED ORDER — INSULIN ASPART 100 UNIT/ML ~~LOC~~ SOLN
3.0000 [IU] | Freq: Three times a day (TID) | SUBCUTANEOUS | Status: DC
  Administered 2012-11-11 – 2012-11-12 (×4): 3 [IU] via SUBCUTANEOUS

## 2012-11-11 MED ORDER — LEVALBUTEROL HCL 0.63 MG/3ML IN NEBU
0.6300 mg | INHALATION_SOLUTION | Freq: Four times a day (QID) | RESPIRATORY_TRACT | Status: DC
  Administered 2012-11-11 – 2012-11-12 (×6): 0.63 mg via RESPIRATORY_TRACT
  Filled 2012-11-11 (×6): qty 3

## 2012-11-11 MED ORDER — PNEUMOCOCCAL VAC POLYVALENT 25 MCG/0.5ML IJ INJ
0.5000 mL | INJECTION | INTRAMUSCULAR | Status: AC
  Administered 2012-11-12: 0.5 mL via INTRAMUSCULAR
  Filled 2012-11-11: qty 0.5

## 2012-11-11 MED ORDER — PANTOPRAZOLE SODIUM 40 MG PO TBEC
40.0000 mg | DELAYED_RELEASE_TABLET | Freq: Every day | ORAL | Status: DC
  Administered 2012-11-11 – 2012-11-12 (×2): 40 mg via ORAL
  Filled 2012-11-11 (×2): qty 1

## 2012-11-11 MED ORDER — IPRATROPIUM BROMIDE 0.02 % IN SOLN
0.5000 mg | Freq: Four times a day (QID) | RESPIRATORY_TRACT | Status: DC
  Administered 2012-11-11 – 2012-11-12 (×6): 0.5 mg via RESPIRATORY_TRACT
  Filled 2012-11-11 (×5): qty 2.5

## 2012-11-11 MED ORDER — INSULIN GLARGINE 100 UNIT/ML ~~LOC~~ SOLN
20.0000 [IU] | Freq: Every day | SUBCUTANEOUS | Status: DC
  Administered 2012-11-11 – 2012-11-12 (×2): 20 [IU] via SUBCUTANEOUS

## 2012-11-11 MED ORDER — INSULIN ASPART 100 UNIT/ML ~~LOC~~ SOLN
0.0000 [IU] | Freq: Three times a day (TID) | SUBCUTANEOUS | Status: DC
  Administered 2012-11-11: 4 [IU] via SUBCUTANEOUS
  Administered 2012-11-11: 3 [IU] via SUBCUTANEOUS
  Administered 2012-11-12 (×2): 4 [IU] via SUBCUTANEOUS

## 2012-11-11 MED ORDER — MAGNESIUM CITRATE PO SOLN
0.5000 | Freq: Once | ORAL | Status: AC
  Administered 2012-11-11: 0.5 via ORAL
  Filled 2012-11-11: qty 296

## 2012-11-11 NOTE — Progress Notes (Signed)
Inpatient Diabetes Program Recommendations  AACE/ADA: New Consensus Statement on Inpatient Glycemic Control (2013)  Target Ranges:  Prepandial:   less than 140 mg/dL      Peak postprandial:   less than 180 mg/dL (1-2 hours)      Critically ill patients:  140 - 180 mg/dL   Results for [name], [name] (MRN [id number]) as of 11/11/2012 09:44  Ref. Range 11/10/2012 07:27 11/10/2012 11:54 11/10/2012 16:17 11/10/2012 20:54 11/10/2012 23:54 11/11/2012 00:29 11/11/2012 07:47  Glucose-Capillary Latest Range: 70-99 mg/dL 409 (H) 811 (H) 914 (H) 446 (H) 409 (H) 373 (H) 308 (H)    Inpatient Diabetes Program Recommendations Insulin - Basal: Please consider changing Lantus 20 units to QAM.  Patient last received Lantus on 11/09/12 @ 21:59.  Bedtime blood glucose last night was 409 and Dr. Phillips Odor was called and Lantus was increased and Novolog 10 units one time order was given.  However, patient did not receive any Lantus last night. Insulin - Meal Coverage: Please consider adding meal coverage.  Recommend Novolog 3 units TID with meals.  Note:  Patient last received Lantus on 11/09/12 @ 21:59.  Bedtime blood glucose last night was 409 and Dr. Phillips Odor was called and Lantus was increased and Novolog 10 units one time order was given.  However, patient did not receive any Lantus last night.  Therefore, recommend it be changed to QAM so that patient will receive dose immediately.  Also, please consider adding meal coverage; recommend Novolog 3 units TID with meals.  Will continue to follow.  Thanks, Orlando Penner, RN, BSN, CCRN Diabetes Coordinator Inpatient Diabetes Program [phone]

## 2012-11-11 NOTE — Progress Notes (Signed)
Earlier tonight, patient's blood sugar and blood pressure were checked. Patients BP was 182/119 and patient's BS was 446. A stat blood glucose was ordered and Apresoline was given to the patient for her BP. BP was rechecked and it was 170/123. Normal BP meds were given and an hour later BP was still 168/110. Doctor was notified and new orders were given to control the BS and BP. At 0054, BS 373 and BP was 146/96. Will continue to monitor.

## 2012-11-11 NOTE — Progress Notes (Signed)
Subjective: Patient reports a difficult night.  She had to keep praying to keep away the demons.  Her son called her multiple times, he was intoxicated and upset her.  She does not feel she is able to go home today. She feels that she was discharged she would return immediately.  Objective: last 24 hours: Filed Vitals:   11/11/12 0419 11/11/12 0731 11/11/12 1244 11/11/12 1331  BP: 126/92  128/92 119/90  Pulse: 79   73  Temp: 97.6 F (36.4 C)   97.3 F (36.3 C)  TempSrc: Oral   Oral  Resp: 20   20  Height:      Weight:      SpO2: 96% 98% 99% 97%    Intake/Output Summary (Last 24 hours) at 11/11/12 1443 Last data filed at 11/11/12 1300  Gross per 24 hour  Intake   1590 ml  Output   2300 ml  Net   -710 ml    Weight change:   Physical exam: General: Obese 47 year old African-American woman sitting up in bed, in no acute distress. Nontoxic Lungs: Clear to auscultation, no wheezes crackles or rales Heart: S1, S2, no murmurs rubs or gallops Abdomen: Obese, positive bowel sounds, soft, nontender, nondistended. Extremities: No pedal edema.  right upper thigh with no edema, erythema or tenderness to palpation.  Left tibal shows darkened patch consistent with healing wound (no broken skin, erythema or edema)  Lab Results: Basic Metabolic Panel:  Basename 11/11/12 0536 11/10/12 2139 11/10/12 0542 11/09/12  NA 129* -- 134* --  K 4.3 -- 4.3 --  CL 94* -- 100 --  CO2 25 -- 25 --  GLUCOSE 311* 454* -- --  BUN 24* -- 17 --  CREATININE 0.89 -- 0.79 --  CALCIUM 10.0 -- 8.9 --  MG -- -- -- 1.6  PHOS -- -- -- --   Liver Function Tests:  [facility] 11/09/12 0642 11/08/12 2013  AST 24 18  ALT 13 13  ALKPHOS 84 87  BILITOT 0.8 0.8  PROT 6.9 7.0  ALBUMIN 2.9* 3.0*   CBC:  Basename 11/10/12 0542 11/09/12 0642 11/08/12 2013  WBC 4.5 5.5 --  NEUTROABS -- -- 3.8  HGB 15.6* 16.0* --  HCT 45.6 45.6 --  MCV 97.6 96.0 --  PLT 174 168 --   Cardiac Enzymes:  Basename 11/08/12 2013   CKTOTAL --  CKMB --  CKMBINDEX --  TROPONINI <0.30   BNP:  Basename 11/11/12 0536 11/08/12 2013  PROBNP 864.1* 725.3*   CBG:  Basename 11/11/12 1149 11/11/12 0747 11/11/12 0029 11/10/12 2354 11/10/12 2054 11/10/12 1617  GLUCAP 177* 308* 373* 409* 446* 236*    Micro: Recent Results (from the past 240 hour(s))  CULTURE, EXPECTORATED SPUTUM-ASSESSMENT     Status: Normal   Collection Time   11/09/12  5:45 PM      Component Value Range Status Comment   Specimen Description SPUTUM EXPECTORATED   Final    Special Requests NONE   Final    Sputum evaluation     Final    Value: THIS SPECIMEN IS ACCEPTABLE. RESPIRATORY CULTURE REPORT TO FOLLOW.     Performed at [facility]   Report Status 11/09/2012 FINAL   Final   CULTURE, RESPIRATORY     Status: Normal (Preliminary result)   Collection Time   11/09/12  5:45 PM      Component Value Range Status Comment   Specimen Description SPUTUM EXPECTORATED   Final    Special Requests NONE  Final    Gram Stain     Final    Value: ABUNDANT WBC PRESENT,BOTH PMN AND MONONUCLEAR     ABUNDANT SQUAMOUS EPITHELIAL CELLS PRESENT     MODERATE GRAM POSITIVE COCCI IN PAIRS     FEW YEAST   Culture MODERATE CANDIDA ALBICANS   Final    Report Status PENDING   Incomplete     Studies/Results: Dg Chest 2 View  11/11/2012  *RADIOLOGY REPORT*  Clinical Data: Shortness of breath, hypertension, diabetes, follow up pneumonia  CHEST - 2 VIEW  Comparison: 11/08/2012  Findings: Enlargement of cardiac silhouette. Tortuous thoracic aorta. Pulmonary vascularity normal. Bronchitic changes with minimal atelectasis at right base. No definite infiltrate, pleural effusion or pneumothorax. Mild endplate spur formation lower thoracic spine.  IMPRESSION: Enlargement of cardiac silhouette. Bronchitic changes with minimal right basilar atelectasis.   Original Report Authenticated By: Ulyses Southward, M.D.     Medications:  Scheduled:    . ALPRAZolam  0.5 mg Oral TID  .  amLODipine  10 mg Oral Daily  . antiseptic oral rinse  15 mL Mouth Rinse BID  . aspirin EC  81 mg Oral Daily  . benazepril  40 mg Oral Daily  . carvedilol  6.25 mg Oral BID WC  . cloNIDine  0.2 mg Oral TID  . enoxaparin (LOVENOX) injection  40 mg Subcutaneous Q24H  . FLUoxetine  10 mg Oral Daily  . furosemide  40 mg Oral Daily  . guaiFENesin  1,200 mg Oral BID  . insulin aspart  0-20 Units Subcutaneous TID WC  . insulin aspart  0-5 Units Subcutaneous QHS  . insulin aspart  3 Units Subcutaneous TID WC  . insulin glargine  20 Units Subcutaneous Daily  . ipratropium  0.5 mg Nebulization QID  . levalbuterol  0.63 mg Nebulization QID  . levofloxacin (LEVAQUIN) IV  750 mg Intravenous Q24H  . metFORMIN  1,000 mg Oral BID WC  . nicotine  21 mg Transdermal Daily  . pantoprazole  40 mg Oral Daily  . pneumococcal 23 valent vaccine  0.5 mL Intramuscular Tomorrow-1000  . simvastatin  20 mg Oral q1800  . sodium chloride  3 mL Intravenous Q12H   Continuous:  ZOX:WRUEAV chloride, acetaminophen, benzonatate, hydrALAZINE, ipratropium, levalbuterol, magnesium hydroxide, Muscle Rub, ondansetron (ZOFRAN) IV, oxyCODONE-acetaminophen, sodium chloride, sodium phosphate, sorbitol  Assessment: Principal Problem:  *Acute respiratory failure Active Problems:  DM (diabetes mellitus), type 2, uncontrolled  HYPERLIPIDEMIA  OBESITY  ANXIETY  DEPRESSION  NICOTINE ADDICTION  CAP (community acquired pneumonia)  HTN (hypertension), malignant  Hypokalemia  Hypertensive cardiomyopathy  CHF (congestive heart failure)  Respiratory distress  Acute bronchitis   (See discharge summary from 1/15).  1. Acute respiratory failure with hypoxia, secondary to community-acquired pneumonia and acute bronchitis. Improved. Will discontinue prednisone as I feel it affected the patient negatively from an emotional standpoint. Will discontinue nebulizers and progressed to inhalers.  Continue guaifenesin and as needed  Tessalon Perles. Continue oxygen titrated to keep oxygen saturations greater than 90%.  Change Levaquin to oral rather than IV  Hypertensive cardiomyopathy/diastolic dysfunction/mild systolic dysfunction.. Her ejection fraction is 45-50% (1/14) grade 1 diastolic dysfunction. She received 2 doses of IV Lasix yesterday.  Her dose of enalapril was increased to 40 mg daily, she was started on Coreg X. 6.25 mg twice a day. We'll continue oral Lasix and other cardiac medications.  TSH 1.379 on 11/07/12  Malignant hypertension. Overall stable. Continue antihypertensive medications. Benazepril increased, Coreg added.  Hypokalemia. Resolved with oral supplementation  Type 2 diabetes mellitus. Continue metformin. Continue sliding scale NovoLog and Lantus (increased to 20 units), mealtime coverage added.  Increased SSI to resistant given short course of prednisone.  Hyperlipidemia. Continue pravastatin.  Hyponatremia. Along with mild elevation in creatinine and decreased chloride. Likely secondary to 2 doses of IV Lasix. Have stopped IV Lasix. Continue by mouth Lasix. Fluids at [facility] A[facility] bmet  Depression/anxiety. Will restart Xanax and Prozac.  History of tobacco abuse. The patient recently quit.  Disposition: Anticipate discharge to home on Saturday, January 18   LOS: 3 days   Conley Canal [phone] 11/11/2012, 2:43 PM   Attending note:  Patient seen and examined. Agree with note as above.  She is feeling better today. Breathing is improving. She felt agitated after prednisone yesterday.  If she continues to improve, then likely discharge home in morning.

## 2012-11-11 NOTE — Progress Notes (Signed)
UR Chart Review Completed  

## 2012-11-11 NOTE — Progress Notes (Signed)
Inpatient Diabetes Program Recommendations  AACE/ADA: New Consensus Statement on Inpatient Glycemic Control (2013)  Target Ranges:  Prepandial:   less than 140 mg/dL      Peak postprandial:   less than 180 mg/dL (1-2 hours)      Critically ill patients:  140 - 180 mg/dL     Note: Spoke with pt about diabetes. Discussed basic pathophysiology of DM Type 2, basic home care, importance of checking CBGs and maintaining good CBG control to prevent long-term and short-term complications. Reviewed signs and symptoms of hyperglycemia and hypoglycemia and treatment for both.  Patient states that she checks her blood sugars frequently at home but she was unable to tell me what a normal blood sugar was.  She reports that her is usually in the 400-500's at home a lot of times.  She reports that she was taking her oral diabetic medication at home but she had stopped Byetta (which was prescribed by Dr. Fransico Him) because  "I saw on the commercials about the medicine that it causes pancreatic cancer".  Talked to patient about why it is important that she take the medicines that she is prescribed and that if she has issues or concerns about the medicines that she needs to talk to the doctor about them so they can work out a plan with her decisions incorporated into them.  She verbalized that she planned to go back to Dr. Fransico Him and talk to him about what is best for her to keep her healthy and her diabetes under control.  She states that she has been told by the doctor that she is going to have to take insulin when she goes home and that a nurse from [facility] will be coming out to help her when she first goes home until she feels comfortable with the regimen for diabetes management.  She reports that she has a goal to change the way she eats and to exercise so that she can lose weight and hopefully come off of the insulin injections in the future.  Informed her about the free outpatient diabetes education class taught  by Altamese Cabal, RD here at [facility] and she reports that she plans to call Jenifer and make arrangements to attend one of her classes.  Patient was very receptive to learning and has initiative  to improve her glycemic control.  Will continue to follow.  Thanks, Orlando Penner, RN, BSN, CCRN Diabetes Coordinator Inpatient Diabetes Program [phone]

## 2012-11-11 NOTE — Progress Notes (Signed)
Pt ambulated in hallway approximately 50 feet with assistance from RN. Pt tolerated well. Pt's O2 saturation on room air ranged from 93-97 % on RA. Will continue to encourage patient to ambulate with assistance.

## 2012-11-12 DIAGNOSIS — F411 Generalized anxiety disorder: Secondary | ICD-10-CM

## 2012-11-12 LAB — GLUCOSE, CAPILLARY
Glucose-Capillary: 160 mg/dL — ABNORMAL HIGH (ref 70–99)
Glucose-Capillary: 156 mg/dL — ABNORMAL HIGH (ref 70–99)

## 2012-11-12 LAB — BASIC METABOLIC PANEL
BUN: 31 mg/dL — ABNORMAL HIGH (ref 6–23)
CO2: 25 mEq/L (ref 19–32)
Calcium: 9.5 mg/dL (ref 8.4–10.5)
Chloride: 102 mEq/L (ref 96–112)
Creatinine, Ser: 1.07 mg/dL (ref 0.50–1.10)
GFR calc Af Amer: 70 mL/min — ABNORMAL LOW (ref >90)
GFR calc non Af Amer: 60 mL/min — ABNORMAL LOW (ref >90)
Glucose, Bld: 162 mg/dL — ABNORMAL HIGH (ref 70–99)
Potassium: 5 mEq/L (ref 3.5–5.1)
Sodium: 137 mEq/L (ref 135–145)

## 2012-11-12 LAB — CULTURE, RESPIRATORY W GRAM STAIN

## 2012-11-12 MED ORDER — CARVEDILOL 6.25 MG PO TABS: 6.2500 mg | ORAL_TABLET | Freq: Two times a day (BID) | ORAL | Status: DC

## 2012-11-12 MED ORDER — SALINE SPRAY 0.65 % NA SOLN
1.0000 | NASAL | Status: DC | PRN
  Administered 2012-11-12: 1 via NASAL
  Filled 2012-11-12: qty 44

## 2012-11-12 MED ORDER — ALPRAZOLAM 0.5 MG PO TABS: 0.5000 mg | ORAL_TABLET | Freq: Three times a day (TID) | ORAL | Status: DC | PRN

## 2012-11-12 MED ORDER — BENAZEPRIL HCL 20 MG PO TABS: 40.0000 mg | ORAL_TABLET | Freq: Every day | ORAL | Status: DC

## 2012-11-12 MED ORDER — LEVOFLOXACIN 750 MG PO TABS: 750.0000 mg | ORAL_TABLET | Freq: Every day | ORAL | Status: DC

## 2012-11-12 MED ORDER — PANTOPRAZOLE SODIUM 40 MG PO TBEC: 40.0000 mg | DELAYED_RELEASE_TABLET | Freq: Every day | ORAL | Status: DC

## 2012-11-12 MED ORDER — ALPRAZOLAM 0.5 MG PO TABS
0.5000 mg | ORAL_TABLET | Freq: Once | ORAL | Status: AC
  Administered 2012-11-12: 0.5 mg via ORAL
  Filled 2012-11-12: qty 1

## 2012-11-12 MED ORDER — INSULIN GLARGINE 100 UNIT/ML ~~LOC~~ SOLN: 20.0000 [IU] | Freq: Every day | SUBCUTANEOUS | Status: DC

## 2012-11-12 MED ORDER — ASPIRIN 81 MG PO TBEC: 81.0000 mg | DELAYED_RELEASE_TABLET | Freq: Every day | ORAL | Status: DC

## 2012-11-12 MED ORDER — NICOTINE 21 MG/24HR TD PT24: 1.0000 | MEDICATED_PATCH | Freq: Every day | TRANSDERMAL | Status: DC

## 2012-11-12 MED ORDER — ALBUTEROL SULFATE HFA 108 (90 BASE) MCG/ACT IN AERS: 2.0000 | INHALATION_SPRAY | Freq: Four times a day (QID) | RESPIRATORY_TRACT | Status: DC | PRN

## 2012-11-12 MED ORDER — BENZONATATE 200 MG PO CAPS: 200.0000 mg | ORAL_CAPSULE | Freq: Three times a day (TID) | ORAL | Status: DC | PRN

## 2012-11-12 NOTE — Progress Notes (Signed)
ANTIBIOTIC CONSULT NOTE  Pharmacy Consult for Levaquin, adjust ABX for renal fxn  Indication: CAP  No Known Allergies  Patient Measurements: Height: 5\' 1"  (154.9 cm) Weight: 190 lb 4.8 oz (86.32 kg) IBW/kg (Calculated) : 47.8   Vital Signs: Temp: 97.6 F (36.4 C) (01/18 0529) Temp src: Oral (01/18 0529) BP: 128/95 mmHg (01/18 0529) Pulse Rate: 74  (01/18 0529) Intake/Output from previous day: 01/17 0701 - 01/18 0700 In: 940 [P.O.:940] Out: 625 [Urine:625] Intake/Output from this shift:    Labs:  Basename 11/12/12 0618 11/11/12 0536 11/10/12 0542  WBC -- -- 4.5  HGB -- -- 15.6*  PLT -- -- 174  LABCREA -- -- --  CREATININE 1.07 0.89 0.79   Estimated Creatinine Clearance: 64.2 ml/min (by C-G formula based on Cr of 1.07). No results found for this basename: VANCOTROUGH:2,VANCOPEAK:2,VANCORANDOM:2,GENTTROUGH:2,GENTPEAK:2,GENTRANDOM:2,TOBRATROUGH:2,TOBRAPEAK:2,TOBRARND:2,AMIKACINPEAK:2,AMIKACINTROU:2,AMIKACIN:2, in the last 72 hours   Microbiology: Recent Results (from the past 720 hour(s))  CULTURE, EXPECTORATED SPUTUM-ASSESSMENT     Status: Normal   Collection Time   11/09/12  5:45 PM      Component Value Range Status Comment   Specimen Description SPUTUM EXPECTORATED   Final    Special Requests NONE   Final    Sputum evaluation     Final    Value: THIS SPECIMEN IS ACCEPTABLE. RESPIRATORY CULTURE REPORT TO FOLLOW.     Performed at [facility]   Report Status 11/09/2012 FINAL   Final   CULTURE, RESPIRATORY     Status: Normal (Preliminary result)   Collection Time   11/09/12  5:45 PM      Component Value Range Status Comment   Specimen Description SPUTUM EXPECTORATED   Final    Special Requests NONE   Final    Gram Stain     Final    Value: ABUNDANT WBC PRESENT,BOTH PMN AND MONONUCLEAR     ABUNDANT SQUAMOUS EPITHELIAL CELLS PRESENT     MODERATE GRAM POSITIVE COCCI IN PAIRS     FEW YEAST   Culture MODERATE CANDIDA ALBICANS   Final    Report Status PENDING    Incomplete    Medications:  Scheduled:     . ALPRAZolam  0.5 mg Oral TID  . [COMPLETED] ALPRAZolam  0.5 mg Oral Once  . amLODipine  10 mg Oral Daily  . antiseptic oral rinse  15 mL Mouth Rinse BID  . aspirin EC  81 mg Oral Daily  . benazepril  40 mg Oral Daily  . carvedilol  6.25 mg Oral BID WC  . cloNIDine  0.2 mg Oral TID  . enoxaparin (LOVENOX) injection  40 mg Subcutaneous Q24H  . FLUoxetine  10 mg Oral Daily  . furosemide  40 mg Oral Daily  . guaiFENesin  1,200 mg Oral BID  . insulin aspart  0-20 Units Subcutaneous TID WC  . insulin aspart  0-5 Units Subcutaneous QHS  . insulin aspart  3 Units Subcutaneous TID WC  . insulin glargine  20 Units Subcutaneous Daily  . ipratropium  0.5 mg Nebulization QID  . levalbuterol  0.63 mg Nebulization QID  . levofloxacin (LEVAQUIN) IV  750 mg Intravenous Q24H  . [COMPLETED] magnesium citrate  0.5 Bottle Oral Once  . metFORMIN  1,000 mg Oral BID WC  . nicotine  21 mg Transdermal Daily  . pantoprazole  40 mg Oral Daily  . [COMPLETED] pneumococcal 23 valent vaccine  0.5 mL Intramuscular Tomorrow-1000  . simvastatin  20 mg Oral q1800  . sodium chloride  3  mL Intravenous Q12H   Assessment: OK for protocol.  Estimated Creatinine Clearance: 64.2 ml/min (by C-G formula based on Cr of 1.07).  Goal of Therapy: Eradicate infection.  Plan: OK to change Levaquin 750mg  q24hrs to oral route per MD note on 1/17. Monitor labs, renal fxn, and cultures per protocol  Mady Gemma 11/12/2012,12:20 PM

## 2012-11-12 NOTE — Progress Notes (Signed)
Pt discharged home today per Dr. Kerry Hough. Pt's IV site D/C'd and WNL. Pt's VS stable at this time. Pt provided with home medication list, discharge instructions and prescriptions. Pt verbalized understanding. Pt given education on carb mod diet and acute bronchitis. Verbalized understanding. Pt also provided with weight scale in order to weigh herself daily d/t CHF. Patient verbalized understanding. Pt left floor via WC in stable condition accompanied by NT.

## 2012-11-12 NOTE — Discharge Summary (Signed)
Physician Discharge Summary  [name]:[id number] DOB: 1966/03/14 DOA: 11/08/2012  PCP: Syliva Overman, MD  Admit date: 11/08/2012 Discharge date: 11/12/2012  Time spent: 40 minutes  Recommendations for Outpatient Follow-up:  1. Patient will be discharged home with home health services (RN) 2. Follow up with primary care doctor in 2 weeks 3. She has been set up with nocturnal oxygen 4. She needs repeat referral to obtain another CPAP machine, will defer this to primary care doctor.  Discharge Diagnoses:  Principal Problem:  *Acute respiratory failure Active Problems:  DM (diabetes mellitus), type 2, uncontrolled  HYPERLIPIDEMIA  OBESITY  ANXIETY  DEPRESSION  NICOTINE ADDICTION  CAP (community acquired pneumonia)  HTN (hypertension), malignant  Hypokalemia  Hypertensive cardiomyopathy  CHF (congestive heart failure)  Respiratory distress  Acute bronchitis   Discharge Condition: improved  Diet recommendation: low salt, low calorie  Filed Weights   11/08/12 2350 11/10/12 0500 11/11/12 1859  Weight: 90.71 kg (199 lb 15.7 oz) 87.907 kg (193 lb 12.8 oz) 86.32 kg (190 lb 4.8 oz)    History of present illness:  [name] is an 47 y.o. female. Obese African American lady with multiple medical problems listed below, was discharged from the service today after overnight admission for orders of breath. She had been noncompliant with her medication was found to have uncontrolled malignant hypertension, she received IV Lasix help with management of her diastolic heart failure, but is felt to have principally community acquired pneumonia contributing heavily to her shortness of breath. She improved significantly overnight and it was felt her community-acquired pneumonia could be managed at home. She was discharged with prescriptions to continue Levaquin.  Patient has not yet picked up her prescriptions, returns to the emergency room complaining of shortness and wheezing.  She was again felt by the emergency room physician to be in heart failure, but it is to be noted that there's been a dramatic fall in her BNP the past 24 hours. She has had no lower extremity edema, she is orthopneic but does not have PND.  She reports wheezing, and coughing; her chest x-ray in the emergency room is unremarkable.   Hospital Course:  1. Acute respiratory failure with hypoxia, secondary to community-acquired pneumonia and acute bronchitis. Patient has completed an antibiotic course with levaquin.  She is not febrile and has a normal wbc count. Continue Tessalon Perles. She was noted to have oxygen saturations 93-95% on ambulation, on room air. It was also noted that her oxygen saturations dropped to 68% while she was sleeping, on room air.  She does admit to having a history of sleep apnea, and is in the process of obtaining another CPAP machine.  Will defer to her primary doctor to help arrange this.  For now, we will arrange for nocturnal oxygen.  Hypertensive cardiomyopathy/diastolic dysfunction/mild systolic dysfunction.. Her ejection fraction is 45-50%. The patient was given intermittent doses of IV lasix during this hospitalization. Currently she does not have any evidence of volume overload.  Will continue oral dose of lasix that she was taking prior to admission.  Malignant hypertension. Overall improved. Medications have been adjusted.  Encouraged compliance.  Hypokalemia. Likely secondary to Lasix. Continue daily supplementation of potassium chloride.   Type 2 diabetes mellitus. Continue metformin. Continue sliding scale NovoLog and Lantus.   Hyperlipidemia. Continue pravastatin.   Depression/anxiety. Will restart Xanax and Prozac.   History of tobacco abuse. The patient recently quit.   Procedures: Echo 1/14:Left ventricle: The cavity size was normal. Wall  thickness was increased in a pattern of severe LVH. Systolic function was mildly reduced. The estimated  ejection fraction was in the range of 45% to 50%. Wall motion was normal; there were no regional wall motion abnormalities. Doppler parameters are consistent with abnormal left ventricular relaxation (grade 1 diastolic dysfunction). Doppler parameters are consistent with elevated ventricular end-diastolic filling pressure. - Mitral valve: Mildly thickened leaflets . Mild regurgitation. - Left atrium: The atrium was mildly dilated. - Tricuspid valve: Trivial regurgitation. - Pericardium, extracardiac: A trivial pericardial effusion was identified.  Consultations:  none  Discharge Exam: Filed Vitals:   11/11/12 2234 11/12/12 0341 11/12/12 0529 11/12/12 0740  BP: 113/76  128/95   Pulse: 84  74   Temp: 98 F (36.7 C)  97.6 F (36.4 C)   TempSrc: Oral  Oral   Resp: 20  18   Height:      Weight:      SpO2: 96% 93% 100% 98%    General: NAD Cardiovascular: S1, s2 RRR Respiratory: CTA B  Discharge Instructions      Discharge Orders    Future Appointments: Provider: Department: Dept Phone: Center:   11/16/2012 1:00 PM Charisse March Essenmacher, OT [facility] [phone] None   11/18/2012 10:15 AM Bethany Rita Ohara, OTR [facility] [phone] None   11/21/2012 1:00 PM Bethany Rita Ohara, OTR [place] [facility] [facility] [phone] None   11/21/2012 2:00 PM Jodelle Gross, NP [facility] [phone] [id number]   11/24/2012 1:45 PM Bethany Rita Ohara, OTR [facility] [phone] None   12/06/2012 2:00 PM Kerri Perches, MD [facility] [phone] [facility]     Future Orders Please Complete By Expires   For home use only DME oxygen      Questions: Responses:   Mode or (Route) Nasal cannula   Liters per Minute 2   Frequency Only at night   Oxygen conserving device Yes   Diet - low sodium heart healthy      Diet Carb Modified      Home Health       Questions: Responses:   To provide the following care/treatments RN   Face-to-face encounter      Comments:   I Shirah Roseman certify that this patient is under my care and that I, or a nurse practitioner or physician's assistant working with me, had a face-to-face encounter that meets the physician face-to-face encounter requirements with this patient on 11/12/2012. The encounter with the patient was in whole, or in part for the following medical condition(s) which is the primary reason for home health care (List medical condition): Patient admitted with acute respiratory failure, pneumonia/bronchitis and sleep apnea.  She was noted to be hypoxic while sleeping, so will be prescribed oxygen until CPAP machine can be obtained.  Request RN to follow up on patient's overall condition, since she is a high risk for coming back to the hospital due to her anxiety.   Questions: Responses:   The encounter with the patient was in whole, or in part, for the following medical condition, which is the primary reason for home health care hypoxia, pneumonia, bronchitis   I certify that, based on my findings, the following services are medically necessary home health services Nursing   My clinical findings support the need for the above services Shortness of breath with activity   Further, I certify that my clinical findings support that this patient is homebound due to: Shortness of Breath  with activity   To provide the following care/treatments RN   Increase activity slowly      Call MD for:  temperature >100.4      Call MD for:  difficulty breathing, headache or visual disturbances      (HEART FAILURE PATIENTS) Call MD:  Anytime you have any of the following symptoms: 1) 3 pound weight gain in 24 hours or 5 pounds in 1 week 2) shortness of breath, with or without a dry hacking cough 3) swelling in the hands, feet or stomach 4) if you have to sleep on extra pillows at night in order to breathe.            Medication List     As of 11/12/2012  1:08 PM    STOP taking these medications         levofloxacin 500 MG tablet   Commonly known as: LEVAQUIN      TAKE these medications         albuterol 108 (90 BASE) MCG/ACT inhaler   Commonly known as: PROVENTIL HFA;VENTOLIN HFA   Inhale 2 puffs into the lungs every 6 (six) hours as needed for wheezing.      ALPRAZolam 0.5 MG tablet   Commonly known as: XANAX   Take 1 tablet (0.5 mg total) by mouth 3 (three) times daily as needed for anxiety.      amLODipine 10 MG tablet   Commonly known as: NORVASC   Take 1 tablet (10 mg total) by mouth daily.      aspirin 81 MG EC tablet   Take 1 tablet (81 mg total) by mouth daily.      benazepril 20 MG tablet   Commonly known as: LOTENSIN   Take 2 tablets (40 mg total) by mouth daily.      benzonatate 200 MG capsule   Commonly known as: TESSALON   Take 1 capsule (200 mg total) by mouth 3 (three) times daily as needed for cough.      carvedilol 6.25 MG tablet   Commonly known as: COREG   Take 1 tablet (6.25 mg total) by mouth 2 (two) times daily with a meal.      cloNIDine 0.2 MG tablet   Commonly known as: CATAPRES   Take 0.2 mg by mouth 3 (three) times daily.      FLUoxetine 10 MG capsule   Commonly known as: PROZAC   Take 1 capsule (10 mg total) by mouth daily.      furosemide 40 MG tablet   Commonly known as: LASIX   Take 1 tablet (40 mg total) by mouth daily. "Fluid pill". For your heart.      insulin glargine 100 UNIT/ML injection   Commonly known as: LANTUS   Inject 20 Units into the skin at bedtime. Lantus Pen.      metFORMIN 1000 MG tablet   Commonly known as: GLUCOPHAGE   Take 1 tablet (1,000 mg total) by mouth 2 (two) times daily with a meal.      nicotine 21 mg/24hr patch   Commonly known as: NICODERM CQ - dosed in mg/24 hours   Place 1 patch onto the skin daily.      oxyCODONE-acetaminophen 5-325 MG per tablet   Commonly known as: PERCOCET/ROXICET   Take 1 tablet  by mouth every 4 (four) hours as needed for pain.      pantoprazole 40 MG tablet   Commonly known as: PROTONIX   Take 1 tablet (40 mg  total) by mouth daily.      potassium chloride SA 15 MEQ tablet   Commonly known as: KLOR-CON M15   Take 2 tablets (30 mEq total) by mouth daily.      pravastatin 40 MG tablet   Commonly known as: PRAVACHOL   Take 1 tablet (40 mg total) by mouth every evening.        Follow-up Information    Follow up with Syliva Overman, MD. Schedule an appointment as soon as possible for a visit in 2 weeks.   Contact information:   [address] [place] [address] [phone]       Follow up with [facility]. (Nurse will come see you in home.)    Contact information:   [place], [place] [phone]          The results of significant diagnostics from this hospitalization (including imaging, microbiology, ancillary and laboratory) are listed below for reference.    Significant Diagnostic Studies: Dg Chest 2 View  11/11/2012  *RADIOLOGY REPORT*  Clinical Data: Shortness of breath, hypertension, diabetes, follow up pneumonia  CHEST - 2 VIEW  Comparison: 11/08/2012  Findings: Enlargement of cardiac silhouette. Tortuous thoracic aorta. Pulmonary vascularity normal. Bronchitic changes with minimal atelectasis at right base. No definite infiltrate, pleural effusion or pneumothorax. Mild endplate spur formation lower thoracic spine.  IMPRESSION: Enlargement of cardiac silhouette. Bronchitic changes with minimal right basilar atelectasis.   Original Report Authenticated By: Ulyses Southward, M.D.    Ct Chest W Contrast  11/08/2012  *RADIOLOGY REPORT*  Clinical Data: Shortness of breath for 5 days.  Congestion and tightness in the chest.  CT CHEST WITH CONTRAST  Technique:  Multidetector CT imaging of the chest was performed following the standard protocol during bolus administration of intravenous contrast.  Contrast: OMNIPAQUE IOHEXOL 300 MG/ML   SOLN  Comparison: None.  Findings: Normal heart size.  Normal caliber thoracic aorta. Visualized central pulmonary arteries appear patent.  Right pretracheal lymph nodes are upper limits of normal size, measuring about 10 mm short axis dimension.  Likely this is reactive.  The esophagus is decompressed.  Small bilateral pleural effusions.  Visualization of the lung parenchyma is limited due to respiratory motion artifact.  Suggestion of fine nodular parenchymal infiltration in the perihilar regions bilaterally.  This suggests edema versus pneumonia.  Mild atelectasis in the lung bases.  No pneumothorax.  Airways appear patent.  Degenerative changes in the thoracic spine.  No destructive bone lesions appreciated.  IMPRESSION: There appears to be patchy nodular perihilar infiltration in both lungs which may represent mild edema or early pneumonia.  Small bilateral pleural effusions.   Original Report Authenticated By: Burman Nieves, M.D.    Dg Chest Port 1 View  11/08/2012  *RADIOLOGY REPORT*  Clinical Data: Shortness of breath, history of pneumonia CHF  PORTABLE CHEST - 1 VIEW  Comparison: 11/07/2012  Findings: Stable cardiomegaly with vascular congestion as before. Minimal basilar atelectasis.  No significant change.  No effusion or pneumothorax.  Trachea midline.  Degenerative changes of the spine.  IMPRESSION: Cardiomegaly with vascular congestion, stable   Original Report Authenticated By: Judie Petit. Shick, M.D.    Dg Chest Portable 1 View  11/07/2012  *RADIOLOGY REPORT*  Clinical Data: Shortness of breath, cough, hypertension, diabetes  PORTABLE CHEST - 1 VIEW  Comparison: Portable exam 1211 hours compared to 04/08/2011  Findings: Enlargement of cardiac silhouette with pulmonary vascular congestion. Tortuous aorta. Mild atelectasis versus early infiltrate at right lung base. Remaining  lungs grossly clear. No pleural effusion or pneumothorax. Bones unremarkable.  IMPRESSION: Enlargement of cardiac silhouette with  pulmonary vascular congestion. Mild atelectasis versus early infiltrate at right lung base.   Original Report Authenticated By: Ulyses Southward, M.D.     Microbiology: Recent Results (from the past 240 hour(s))  CULTURE, EXPECTORATED SPUTUM-ASSESSMENT     Status: Normal   Collection Time   11/09/12  5:45 PM      Component Value Range Status Comment   Specimen Description SPUTUM EXPECTORATED   Final    Special Requests NONE   Final    Sputum evaluation     Final    Value: THIS SPECIMEN IS ACCEPTABLE. RESPIRATORY CULTURE REPORT TO FOLLOW.     Performed at [facility]   Report Status 11/09/2012 FINAL   Final   CULTURE, RESPIRATORY     Status: Normal (Preliminary result)   Collection Time   11/09/12  5:45 PM      Component Value Range Status Comment   Specimen Description SPUTUM EXPECTORATED   Final    Special Requests NONE   Final    Gram Stain     Final    Value: ABUNDANT WBC PRESENT,BOTH PMN AND MONONUCLEAR     ABUNDANT SQUAMOUS EPITHELIAL CELLS PRESENT     MODERATE GRAM POSITIVE COCCI IN PAIRS     FEW YEAST   Culture MODERATE CANDIDA ALBICANS   Final    Report Status PENDING   Incomplete      Labs: Basic Metabolic Panel:  Lab 11/12/12 4098 11/11/12 0536 11/10/12 2139 11/10/12 0542 11/09/12 0642 11/09/12 11/08/12 2013 11/07/12 1629  NA 137 129* -- 134* 134* -- 133* --  K 5.0 4.3 -- 4.3 4.0 -- 3.3* --  CL 102 94* -- 100 95* -- 94* --  CO2 25 25 -- 25 28 -- 28 --  GLUCOSE 162* 311* 454* 228* 145* -- -- --  BUN 31* 24* -- 17 14 -- 15 --  CREATININE 1.07 0.89 -- 0.79 0.95 -- 0.93 --  CALCIUM 9.5 10.0 -- 8.9 9.3 -- 9.5 --  MG -- -- -- -- -- 1.6 -- 1.4*  PHOS -- -- -- -- -- -- -- --   Liver Function Tests:  Lab 11/09/12 0642 11/08/12 2013 11/08/12 0534  AST 24 18 13   ALT 13 13 11   ALKPHOS 84 87 82  BILITOT 0.8 0.8 1.1  PROT 6.9 7.0 6.5  ALBUMIN 2.9* 3.0* 2.7*   No results found for this basename: LIPASE:5,AMYLASE:5 in the last 168 hours No results found for this  basename: AMMONIA:5 in the last 168 hours CBC:  Lab 11/10/12 0542 11/09/12 0642 11/08/12 2013 11/08/12 0534 11/07/12 1153  WBC 4.5 5.5 5.3 4.4 4.9  NEUTROABS -- -- 3.8 -- 4.0  HGB 15.6* 16.0* 16.3* 15.0 15.8*  HCT 45.6 45.6 45.9 44.0 45.1  MCV 97.6 96.0 95.4 96.5 96.6  PLT 174 168 156 162 153   Cardiac Enzymes:  Lab 11/08/12 2013 11/08/12 0037 11/07/12 1853 11/07/12 1153  CKTOTAL -- -- -- --  CKMB -- -- -- --  CKMBINDEX -- -- -- --  TROPONINI <0.30 <0.30 <0.30 <0.30   BNP: BNP (last 3 results)  Basename 11/11/12 0536 11/08/12 2013 11/07/12 1153  PROBNP 864.1* 725.3* 3256.0*   CBG:  Lab 11/12/12 1200 11/12/12 0741 11/11/12 2235 11/11/12 1621 11/11/12 1149  GLUCAP 156* 160* 154* 148* 177*       Signed:  Kaliana Albino  [facility] 11/12/2012, 1:08 PM

## 2012-11-12 NOTE — Progress Notes (Signed)
Patient woke up extremely anxious. Respiratory was called and came up, and then Doctor Phillips Odor was notified and new orders were given for a one time dose of Xanax. Will continue to monitor patient.

## 2012-11-14 ENCOUNTER — Telehealth: Payer: Self-pay | Admitting: Family Medicine

## 2012-11-14 ENCOUNTER — Telehealth: Payer: Self-pay | Admitting: Orthopedic Surgery

## 2012-11-14 NOTE — Telephone Encounter (Signed)
Spoke with [name] and agree. States her BP was 126 88 today but had her meds delivered while she was there but they had to be sent back because she could not pay

## 2012-11-14 NOTE — Telephone Encounter (Signed)
[facility] said she is admitting [name] for in home therapy for congestive heart failure and pneumonia. Seryna is cancelling her outpatient PT for now due to her current sickness. She will continue the outpatient PT once she is over this and gets her strength back.

## 2012-11-14 NOTE — Telephone Encounter (Signed)
Noted and agree with this course of action, she needs help!

## 2012-11-15 ENCOUNTER — Ambulatory Visit: Payer: PRIVATE HEALTH INSURANCE | Admitting: Family Medicine

## 2012-11-16 ENCOUNTER — Inpatient Hospital Stay (HOSPITAL_COMMUNITY): Admission: RE | Admit: 2012-11-16 | Payer: PRIVATE HEALTH INSURANCE | Source: Ambulatory Visit

## 2012-11-17 DIAGNOSIS — Z0000 Encounter for general adult medical examination without abnormal findings: Secondary | ICD-10-CM | POA: Insufficient documentation

## 2012-11-17 NOTE — Assessment & Plan Note (Signed)
Uncontrolled, consistently dangerously high. Meds precribed and pt to collect and start taking them Advised Ed eval if developed headache, numbness or weakness. DASH diet and commitment to daily physical activity for a minimum of 30 minutes discussed and encouraged, as a part of hypertension management. The importance of attaining a healthy weight is also discussed. This is a recurrent problem she takes no medications

## 2012-11-17 NOTE — Assessment & Plan Note (Signed)
Uncontrolled, Hyperlipidemia:Low fat diet discussed and encouraged.  Med prescribed

## 2012-11-17 NOTE — Assessment & Plan Note (Signed)
Pelvic and breast exam completed as documented, pap sent, rectal refused. Pt encouraged to commit to med compliance and also is referred to mental health

## 2012-11-17 NOTE — Assessment & Plan Note (Signed)
Severe and uncontrolled Pt to start medication and she is referre for therapy

## 2012-11-17 NOTE — Assessment & Plan Note (Signed)
Pt referred to surgery for re evaluation

## 2012-11-18 ENCOUNTER — Ambulatory Visit (HOSPITAL_COMMUNITY): Payer: PRIVATE HEALTH INSURANCE | Admitting: Specialist

## 2012-11-21 ENCOUNTER — Telehealth: Payer: Self-pay | Admitting: Adult Health

## 2012-11-21 ENCOUNTER — Encounter: Payer: PRIVATE HEALTH INSURANCE | Admitting: Adult Health

## 2012-11-21 ENCOUNTER — Inpatient Hospital Stay (HOSPITAL_COMMUNITY): Admission: RE | Admit: 2012-11-21 | Payer: PRIVATE HEALTH INSURANCE | Source: Ambulatory Visit | Admitting: Specialist

## 2012-11-21 NOTE — Telephone Encounter (Signed)
No showed appointment.  Unable to leave message due to mailbox being full.  Letter mailed to patient. / tgs

## 2012-11-22 DIAGNOSIS — E1165 Type 2 diabetes mellitus with hyperglycemia: Secondary | ICD-10-CM

## 2012-11-22 DIAGNOSIS — I428 Other cardiomyopathies: Secondary | ICD-10-CM

## 2012-11-22 DIAGNOSIS — IMO0001 Reserved for inherently not codable concepts without codable children: Secondary | ICD-10-CM

## 2012-11-22 DIAGNOSIS — I509 Heart failure, unspecified: Secondary | ICD-10-CM

## 2012-11-22 DIAGNOSIS — I119 Hypertensive heart disease without heart failure: Secondary | ICD-10-CM

## 2012-11-24 ENCOUNTER — Ambulatory Visit (HOSPITAL_COMMUNITY): Payer: PRIVATE HEALTH INSURANCE | Admitting: Specialist

## 2012-11-25 NOTE — Telephone Encounter (Signed)
No notes added.

## 2012-11-29 ENCOUNTER — Telehealth: Payer: Self-pay | Admitting: Family Medicine

## 2012-11-29 NOTE — Telephone Encounter (Signed)
Please advise 

## 2012-11-29 NOTE — Telephone Encounter (Signed)
Pt needs to see behavioral health [place], for help with her anxiety and depression, this is what I advise, the specialist will best know how to treat her, she needs to continue the xanax as I prescribe it until she sees them let me know if she wants a referral

## 2012-12-02 NOTE — Telephone Encounter (Signed)
Patient was mistaken.  She received a rx from her hospital discharge with the instructions three times daily as needed #30.  She still has rx from Dr. [name] and can fill that when she runs out.

## 2012-12-05 ENCOUNTER — Telehealth: Payer: Self-pay | Admitting: Family Medicine

## 2012-12-06 ENCOUNTER — Ambulatory Visit (INDEPENDENT_AMBULATORY_CARE_PROVIDER_SITE_OTHER): Payer: PRIVATE HEALTH INSURANCE | Admitting: Family Medicine

## 2012-12-06 ENCOUNTER — Encounter: Payer: Self-pay | Admitting: Family Medicine

## 2012-12-06 ENCOUNTER — Other Ambulatory Visit: Payer: Self-pay

## 2012-12-06 VITALS — BP 120/90 | Resp 16 | Ht 62.0 in | Wt 188.1 lb

## 2012-12-06 DIAGNOSIS — IMO0001 Reserved for inherently not codable concepts without codable children: Secondary | ICD-10-CM

## 2012-12-06 DIAGNOSIS — E669 Obesity, unspecified: Secondary | ICD-10-CM

## 2012-12-06 DIAGNOSIS — F329 Major depressive disorder, single episode, unspecified: Secondary | ICD-10-CM

## 2012-12-06 DIAGNOSIS — I119 Hypertensive heart disease without heart failure: Secondary | ICD-10-CM

## 2012-12-06 DIAGNOSIS — E1165 Type 2 diabetes mellitus with hyperglycemia: Secondary | ICD-10-CM

## 2012-12-06 DIAGNOSIS — I10 Essential (primary) hypertension: Secondary | ICD-10-CM

## 2012-12-06 DIAGNOSIS — I428 Other cardiomyopathies: Secondary | ICD-10-CM

## 2012-12-06 DIAGNOSIS — E785 Hyperlipidemia, unspecified: Secondary | ICD-10-CM

## 2012-12-06 DIAGNOSIS — F411 Generalized anxiety disorder: Secondary | ICD-10-CM

## 2012-12-06 LAB — BASIC METABOLIC PANEL
BUN: 36 mg/dL — ABNORMAL HIGH (ref 6–23)
CO2: 21 mEq/L (ref 19–32)
Calcium: 10.2 mg/dL (ref 8.4–10.5)
Chloride: 103 mEq/L (ref 96–112)
Creat: 1.38 mg/dL — ABNORMAL HIGH (ref 0.50–1.10)
Glucose, Bld: 217 mg/dL — ABNORMAL HIGH (ref 70–99)
Potassium: 5.3 mEq/L (ref 3.5–5.3)
Sodium: 136 mEq/L (ref 135–145)

## 2012-12-06 MED ORDER — ALPRAZOLAM 0.5 MG PO TABS: 0.5000 mg | ORAL_TABLET | Freq: Three times a day (TID) | ORAL | Status: DC | PRN

## 2012-12-06 MED ORDER — BENAZEPRIL HCL 20 MG PO TABS: 20.0000 mg | ORAL_TABLET | Freq: Every day | ORAL | Status: DC

## 2012-12-06 NOTE — Progress Notes (Signed)
  Subjective:    Patient ID: [name], female    DOB: [birth year], 47 y.o.   MRN: [id number]  HPI Pt in for f/u recent hosopitalization due to acute respiratory failure from 01. 10 to 11/08/2012 Since discharge , she is being followed by home health which is a big help for her as far as medication compliance is concerned She feels well, denies productive cough, fever or chills, denies sinus pressure , sore throat or ear pain. Blood sugars remain uncontrolled, needs to re establish with endo  Review of Systems See HPI Denies recent fever or chills. Denies sinus pressure, nasal congestion, ear pain or sore throat. Denies chest congestion, productive cough or wheezing. Denies chest pains, palpitations and leg swelling Denies abdominal pain, nausea, vomiting,diarrhea or constipation.   Denies dysuria, frequency, hesitancy or incontinence. Chronic joint pain unchanged Denies headaches, seizures, numbness, or tingling. Denies uncontrolled  depression, anxiety or insomnia. Denies skin break down or rash.        Objective:   Physical Exam Patient alert and oriented and in no cardiopulmonary distress.  HEENT: No facial asymmetry, EOMI, no sinus tenderness,  oropharynx pink and moist.  Neck supple no adenopathy.  Chest: Clear to auscultation bilaterally.  CVS: S1, S2 no murmurs, no S3.  ABD: Soft non tender. Bowel sounds normal.  Ext: No edema  MS: Adequate ROM spine, shoulders, hips and knees.  Skin: Intact, no ulcerations or rash noted.  Psych: Good eye contact, normal affect. Memory intact not anxious or depressed appearing.  CNS: CN 2-12 intact, power, tone and sensation normal throughout.        Assessment & Plan:

## 2012-12-06 NOTE — Patient Instructions (Addendum)
F/u in 3 month  Chem 7 today.  Please get chem 7 today.  Please make new appointment with behavioral health when you leave.  Please check with referral staff for appt with cardiology and Dr Fransico Him on your way out

## 2012-12-06 NOTE — Telephone Encounter (Signed)
Pt here for OV today.  

## 2012-12-07 ENCOUNTER — Other Ambulatory Visit: Payer: Self-pay

## 2012-12-07 DIAGNOSIS — F329 Major depressive disorder, single episode, unspecified: Secondary | ICD-10-CM

## 2012-12-07 MED ORDER — FUROSEMIDE 40 MG PO TABS: 40.0000 mg | ORAL_TABLET | Freq: Every day | ORAL | Status: DC

## 2012-12-07 MED ORDER — FLUOXETINE HCL 10 MG PO CAPS: 10.0000 mg | ORAL_CAPSULE | Freq: Every day | ORAL | Status: DC

## 2012-12-07 MED ORDER — PANTOPRAZOLE SODIUM 40 MG PO TBEC: 40.0000 mg | DELAYED_RELEASE_TABLET | Freq: Every day | ORAL | Status: DC

## 2012-12-07 NOTE — Assessment & Plan Note (Signed)
Uncontrolled. Hyperlipidemia:Low fat diet discussed and encouraged.  Updated lab next visit

## 2012-12-07 NOTE — Assessment & Plan Note (Signed)
Marked improvement , now that she is compliant with meds. DASH diet and commitment to daily physical activity for a minimum of 30 minutes discussed and encouraged, as a part of hypertension management. The importance of attaining a healthy weight is also discussed.

## 2012-12-07 NOTE — Assessment & Plan Note (Signed)
Controlled, no change in medication  

## 2012-12-07 NOTE — Assessment & Plan Note (Signed)
Improved. Pt applauded on succesful weight loss through lifestyle change, and encouraged to continue same. Weight loss goal set for the next several months.  

## 2012-12-07 NOTE — Assessment & Plan Note (Signed)
Improved on medication, now has hope , wants to change living environ, will reschedule with psych for therapyt, wa hospitalized attht e time she had her appt

## 2012-12-07 NOTE — Assessment & Plan Note (Signed)
appt with endo for follow up to be made on pt's behalf. Patient advised to reduce carb and sweets, commit to regular physical activity, take meds as prescribed, test blood as directed, and attempt to lose weight, to improve blood sugar control.

## 2012-12-07 NOTE — Assessment & Plan Note (Signed)
Asymptomatic of heart failure currently, and being followed by home health nurses. Needs f/u with cardiology following recent hospitalization , appt to be scheduled on her way out

## 2012-12-12 ENCOUNTER — Encounter: Payer: PRIVATE HEALTH INSURANCE | Admitting: Adult Health

## 2012-12-20 ENCOUNTER — Ambulatory Visit (INDEPENDENT_AMBULATORY_CARE_PROVIDER_SITE_OTHER): Payer: PRIVATE HEALTH INSURANCE | Admitting: Adult Health

## 2012-12-20 ENCOUNTER — Encounter: Payer: Self-pay | Admitting: Adult Health

## 2012-12-20 VITALS — BP 106/78 | HR 89 | Ht 61.0 in | Wt 192.0 lb

## 2012-12-20 DIAGNOSIS — I119 Hypertensive heart disease without heart failure: Secondary | ICD-10-CM

## 2012-12-20 DIAGNOSIS — I10 Essential (primary) hypertension: Secondary | ICD-10-CM

## 2012-12-20 DIAGNOSIS — I428 Other cardiomyopathies: Secondary | ICD-10-CM

## 2012-12-20 DIAGNOSIS — I509 Heart failure, unspecified: Secondary | ICD-10-CM

## 2012-12-20 DIAGNOSIS — I429 Cardiomyopathy, unspecified: Secondary | ICD-10-CM

## 2012-12-20 LAB — BASIC METABOLIC PANEL
BUN: 25 mg/dL — ABNORMAL HIGH (ref 6–23)
CO2: 22 mEq/L (ref 19–32)
Calcium: 9.2 mg/dL (ref 8.4–10.5)
Chloride: 100 mEq/L (ref 96–112)
Creat: 1.61 mg/dL — ABNORMAL HIGH (ref 0.50–1.10)
Glucose, Bld: 246 mg/dL — ABNORMAL HIGH (ref 70–99)
Potassium: 4.6 mEq/L (ref 3.5–5.3)
Sodium: 136 mEq/L (ref 135–145)

## 2012-12-20 NOTE — Progress Notes (Deleted)
Name: [name]    DOB: 1966/08/07  Age: 47 y.o.  MR#: [id number]       PCP:  Syliva Overman, MD      Insurance: Payor: [employer] MEDICARE  Plan: [facility]  Product Type: *No Product type*    CC:   No chief complaint on file.   VS Filed Vitals:   12/20/12 1114  BP: 106/78  Pulse: 89  Height: 5\' 1"  (1.549 m)  Weight: 192 lb (87.091 kg)    Weights Current Weight  12/20/12 192 lb (87.091 kg)  12/06/12 188 lb 1.9 oz (85.331 kg)  11/11/12 190 lb 4.8 oz (86.32 kg)    Blood Pressure  BP Readings from Last 3 Encounters:  12/20/12 106/78  12/06/12 120/90  11/12/12 128/95     Admit date:  (Not on file) Last encounter with R[facility]:  12/12/2012   Allergy Review of patient's allergies indicates no known allergies.  Current Outpatient Prescriptions  Medication Sig Dispense Refill  . albuterol (PROVENTIL HFA;VENTOLIN HFA) 108 (90 BASE) MCG/ACT inhaler Inhale 2 puffs into the lungs every 6 (six) hours as needed for wheezing.  1 Inhaler  2  . ALPRAZolam (XANAX) 0.5 MG tablet Take 1 tablet (0.5 mg total) by mouth 3 (three) times daily as needed for anxiety.  30 tablet  1  . amLODipine (NORVASC) 10 MG tablet Take 1 tablet (10 mg total) by mouth daily.  30 tablet  5  . aspirin EC 81 MG EC tablet Take 1 tablet (81 mg total) by mouth daily.  30 tablet  1  . benazepril-hydrochlorthiazide (LOTENSIN HCT) 20-12.5 MG per tablet Two tablets daily  60 tablet  11  . carvedilol (COREG) 6.25 MG tablet Take 1 tablet (6.25 mg total) by mouth 2 (two) times daily with a meal.  60 tablet  0  . cloNIDine (CATAPRES) 0.2 MG tablet Take 0.2 mg by mouth 3 (three) times daily.      Marland Kitchen FLUoxetine (PROZAC) 10 MG capsule Take 1 capsule (10 mg total) by mouth daily.  30 capsule  3  . furosemide (LASIX) 40 MG tablet Take 1 tablet (40 mg total) by mouth daily. "Fluid pill". For your heart.  30 tablet  3  . insulin glargine (LANTUS) 100 UNIT/ML injection Inject 14 Units into the skin at bedtime. Lantus Pen.       . metFORMIN (GLUCOPHAGE) 1000 MG tablet Take 1 tablet (1,000 mg total) by mouth 2 (two) times daily with a meal.  60 tablet  11  . pantoprazole (PROTONIX) 40 MG tablet Take 1 tablet (40 mg total) by mouth daily.  30 tablet  3  . potassium chloride SA (K-DUR,KLOR-CON) 20 MEQ tablet Take 20 mEq by mouth. One and one half daily      . pravastatin (PRAVACHOL) 40 MG tablet Take 1 tablet (40 mg total) by mouth every evening.  30 tablet  5   No current facility-administered medications for this visit.    Discontinued Meds:    Medications Discontinued During This Encounter  Medication Reason  . nicotine (NICODERM CQ - DOSED IN MG/24 HOURS) 21 mg/24hr patch Patient has not taken in last 30 days    Patient Active Problem List  Diagnosis  . DM (diabetes mellitus), type 2, uncontrolled  . HYPERLIPIDEMIA  . OBESITY  . ANXIETY  . ALCOHOL USE  . DEPRESSION  . HYPERTENSION  . SHOULDER PAIN, RIGHT  . BACK PAIN  . BURSITIS, RIGHT SHOULDER  . INSOMNIA  . FATIGUE  .  RECTAL BLEEDING, HX OF  . NICOTINE ADDICTION  . Breast mass, left  . Vitamin d deficiency  . Diabetic Charcot foot  . Ankle fracture, lateral malleolus, closed  . Medical non-compliance  . Closed left ankle fracture  . Foot pain  . Leg ulcer, left  . Shoulder pain, right  . Poor mobility  . Muscle weakness (generalized)  . Rotator cuff syndrome of right shoulder  . CAP (community acquired pneumonia)  . HTN (hypertension), malignant  . Hypokalemia  . Hypertensive cardiomyopathy  . CHF (congestive heart failure)  . Respiratory distress  . Acute respiratory failure  . Acute bronchitis  . Routine general medical examination at a health care facility    LABS    Component Value Date/Time   NA 136 12/06/2012 1516   NA 137 11/12/2012 0618   NA 129* 11/11/2012 0536   K 5.3 12/06/2012 1516   K 5.0 11/12/2012 0618   K 4.3 11/11/2012 0536   CL 103 12/06/2012 1516   CL 102 11/12/2012 0618   CL 94* 11/11/2012 0536   CO2 21  12/06/2012 1516   CO2 25 11/12/2012 0618   CO2 25 11/11/2012 0536   GLUCOSE 217* 12/06/2012 1516   GLUCOSE 162* 11/12/2012 0618   GLUCOSE 311* 11/11/2012 0536   BUN 36* 12/06/2012 1516   BUN 31* 11/12/2012 0618   BUN 24* 11/11/2012 0536   CREATININE 1.38* 12/06/2012 1516   CREATININE 1.07 11/12/2012 0618   CREATININE 0.89 11/11/2012 0536   CREATININE 0.79 11/10/2012 0542   CREATININE 0.84 11/01/2012 1443   CREATININE 1.04 04/18/2012 1332   CALCIUM 10.2 12/06/2012 1516   CALCIUM 9.5 11/12/2012 0618   CALCIUM 10.0 11/11/2012 0536   GFRNONAA 60* 11/12/2012 0618   GFRNONAA 75* 11/11/2012 0536   GFRNONAA >90 11/10/2012 0542   GFRAA 70* 11/12/2012 0618   GFRAA 87* 11/11/2012 0536   GFRAA >90 11/10/2012 0542   CMP     Component Value Date/Time   NA 136 12/06/2012 1516   K 5.3 12/06/2012 1516   CL 103 12/06/2012 1516   CO2 21 12/06/2012 1516   GLUCOSE 217* 12/06/2012 1516   BUN 36* 12/06/2012 1516   CREATININE 1.38* 12/06/2012 1516   CREATININE 1.07 11/12/2012 0618   CALCIUM 10.2 12/06/2012 1516   PROT 6.9 11/09/2012 0642   ALBUMIN 2.9* 11/09/2012 0642   AST 24 11/09/2012 0642   ALT 13 11/09/2012 0642   ALKPHOS 84 11/09/2012 0642   BILITOT 0.8 11/09/2012 0642   GFRNONAA 60* 11/12/2012 0618   GFRAA 70* 11/12/2012 0618       Component Value Date/Time   WBC 4.5 11/10/2012 0542   WBC 5.5 11/09/2012 0642   WBC 5.3 11/08/2012 2013   HGB 15.6* 11/10/2012 0542   HGB 16.0* 11/09/2012 0642   HGB 16.3* 11/08/2012 2013   HCT 45.6 11/10/2012 0542   HCT 45.6 11/09/2012 0642   HCT 45.9 11/08/2012 2013   MCV 97.6 11/10/2012 0542   MCV 96.0 11/09/2012 0642   MCV 95.4 11/08/2012 2013    Lipid Panel     Component Value Date/Time   CHOL 224* 11/08/2012 0534   TRIG 184* 11/08/2012 0534   HDL 50 11/08/2012 0534   CHOLHDL 4.5 11/08/2012 0534   VLDL 37 11/08/2012 0534   LDLCALC 137* 11/08/2012 0534    ABG    Component Value Date/Time   PHART 7.462* 11/08/2012 2113   PCO2ART 39.7 11/08/2012 2113   PO2ART 59.7* 11/08/2012 2113   HCO3  28.0* 11/08/2012 2113   TCO2 23.7 11/08/2012 2113   O2SAT 89.7 11/08/2012 2113     Lab Results  Component Value Date   TSH 1.379 11/07/2012   BNP (last 3 results)  Recent Labs  11/07/12 1153 11/08/12 2013 11/11/12 0536  PROBNP 3256.0* 725.3* 864.1*   Cardiac Panel (last 3 results) No results found for this basename: CKTOTAL, CKMB, TROPONINI, RELINDX,  in the last 72 hours  Iron/TIBC/Ferritin No results found for this basename: iron, tibc, ferritin     EKG Orders placed in visit on 12/20/12  . EKG 12-LEAD     Prior Assessment and Plan Problem List as of 12/20/2012     ICD-9-CM     Cardiology Problems   HTN (hypertension), malignant   Last Assessment & Plan   12/06/2012 Office Visit Written 12/07/2012  5:47 PM by Kerri Perches, MD     Marked improvement , now that she is compliant with meds. DASH diet and commitment to daily physical activity for a minimum of 30 minutes discussed and encouraged, as a part of hypertension management. The importance of attaining a healthy weight is also discussed.     HYPERLIPIDEMIA   Last Assessment & Plan   12/06/2012 Office Visit Written 12/07/2012  5:49 PM by Kerri Perches, MD     Uncontrolled. Hyperlipidemia:Low fat diet discussed and encouraged.  Updated lab next visit    HYPERTENSION   Last Assessment & Plan   11/01/2012 Office Visit Written 11/17/2012 12:09 AM by Kerri Perches, MD     Uncontrolled, consistently dangerously high. Meds precribed and pt to collect and start taking them Advised Ed eval if developed headache, numbness or weakness. DASH diet and commitment to daily physical activity for a minimum of 30 minutes discussed and encouraged, as a part of hypertension management. The importance of attaining a healthy weight is also discussed. This is a recurrent problem she takes no medications    Hypertensive cardiomyopathy   Last Assessment & Plan   12/06/2012 Office Visit Written 12/07/2012  5:48 PM by  Kerri Perches, MD     Asymptomatic of heart failure currently, and being followed by home health nurses. Needs f/u with cardiology following recent hospitalization , appt to be scheduled on her way out    CHF (congestive heart failure)     Other   DM (diabetes mellitus), type 2, uncontrolled   Last Assessment & Plan   12/06/2012 Office Visit Written 12/07/2012  5:49 PM by Kerri Perches, MD     appt with endo for follow up to be made on pt's behalf. Patient advised to reduce carb and sweets, commit to regular physical activity, take meds as prescribed, test blood as directed, and attempt to lose weight, to improve blood sugar control.     OBESITY   Last Assessment & Plan   12/06/2012 Office Visit Written 12/07/2012  5:52 PM by Kerri Perches, MD     Improved. Pt applauded on succesful weight loss through lifestyle change, and encouraged to continue same. Weight loss goal set for the next several months.     ANXIETY   Last Assessment & Plan   12/06/2012 Office Visit Written 12/07/2012  5:50 PM by Kerri Perches, MD     Controlled, no change in medication     ALCOHOL USE   DEPRESSION   Last Assessment & Plan   12/06/2012 Office Visit Written 12/07/2012  5:51 PM by Kerri Perches, MD  Improved on medication, now has hope , wants to change living environ, will reschedule with psych for therapyt, wa hospitalized attht e time she had her appt    SHOULDER PAIN, RIGHT   Last Assessment & Plan   05/10/2012 Office Visit Edited 05/10/2012  7:18 PM by Kerri Perches, MD     Increased with reduced mobility, order MRI, toradol administered at visit    BACK PAIN   BURSITIS, RIGHT SHOULDER   INSOMNIA   FATIGUE   RECTAL BLEEDING, HX OF   NICOTINE ADDICTION   Last Assessment & Plan   11/01/2012 Office Visit Written 11/01/2012  2:42 PM by Kerri Perches, MD     Patient counseled for approximately 5 minutes regarding the health risks of ongoing nicotine use, specifically  all types of cancer, heart disease, stroke and respiratory failure. The options available for help with cessation ,the behavioral changes to assist the process, and the option to either gradully reduce usage  Or abruptly stop.is also discussed. Pt is also encouraged to set specific goals in number of cigarettes used daily, as well as to set a quit date.     Breast mass, left   Last Assessment & Plan   11/01/2012 Office Visit Written 11/17/2012 12:11 AM by Kerri Perches, MD     Pt referred to surgery for re evaluation    Vitamin d deficiency   Diabetic Charcot foot   Ankle fracture, lateral malleolus, closed   Medical non-compliance   Last Assessment & Plan   04/18/2012 Office Visit Written 04/24/2012  3:51 PM by Kerri Perches, MD     An ongoing issue, the only medication currently reports taking is "pain med" and always requesting this, although I am not responsible for the script    Closed left ankle fracture   Foot pain   Leg ulcer, left   Last Assessment & Plan   04/18/2012 Office Visit Written 04/24/2012  3:54 PM by Kerri Perches, MD     Malodorous leg ulcer obviously infected, wound cleaned with saline and gauze dressing applied, rocephin administered in office, antibiotics prescribed and pt referred to the wound center    Shoulder pain, right   Poor mobility   Last Assessment & Plan   05/10/2012 Office Visit Written 05/10/2012  7:12 PM by Kerri Perches, MD     Requesting power wheelchair , will refer to physical therapy for further eval, she does have ankle pathology, left, left leg ulcer and is now c/o right shoulder debility    Muscle weakness (generalized)   Rotator cuff syndrome of right shoulder   CAP (community acquired pneumonia)   Hypokalemia   Respiratory distress   Acute respiratory failure   Acute bronchitis   Routine general medical examination at a health care facility   Last Assessment & Plan   11/01/2012 Office Visit Written 11/17/2012 12:15 AM by  Kerri Perches, MD     Pelvic and breast exam completed as documented, pap sent, rectal refused. Pt encouraged to commit to med compliance and also is referred to mental health        Imaging: No results found.

## 2012-12-20 NOTE — Assessment & Plan Note (Signed)
She is well compensated on her fluid status. No evidence of fluid overload. Follow up BMET is requested to evaluated kidney status on current diuretic and ACE.

## 2012-12-20 NOTE — Assessment & Plan Note (Signed)
Currently well controlled on medications prescribed. She is tolerating them well.

## 2012-12-20 NOTE — Patient Instructions (Addendum)
Your physician recommends that you schedule a follow-up appointment in: 1 month  Your physician recommends that you return for lab work in: This week at [facility]  Your physician recommends that you weigh, daily, at the same time every day, and in the same amount of clothing. Please record your daily weights on the handout provided and bring it to your next appointment.

## 2012-12-20 NOTE — Progress Notes (Signed)
HPI: [name] is a 47 y/o patient of Dr. Daleen Squibb, new to our practice after hospitalization for decompensated systolic CHF ("EF of 45%), respiratory distress, hypertensive cardiomyopathy, and community acquired pneumonia. She has other history to include diabetes, medical noncompliance, hyperlipidemia, OSA, and depression with anxiety. She comes today without cardiac complaint.Dry wt is 188 lbs. With 9 lb wt loss during hospitalization.I find no evidence of stress test or cardiac cath in prior records. Echocardiogram during hospitalization demonstrated severe LVH, EF of 45% -50% with no WMA.  No Known Allergies  Current Outpatient Prescriptions  Medication Sig Dispense Refill  . albuterol (PROVENTIL HFA;VENTOLIN HFA) 108 (90 BASE) MCG/ACT inhaler Inhale 2 puffs into the lungs every 6 (six) hours as needed for wheezing.  1 Inhaler  2  . ALPRAZolam (XANAX) 0.5 MG tablet Take 1 tablet (0.5 mg total) by mouth 3 (three) times daily as needed for anxiety.  30 tablet  1  . amLODipine (NORVASC) 10 MG tablet Take 1 tablet (10 mg total) by mouth daily.  30 tablet  5  . aspirin EC 81 MG EC tablet Take 1 tablet (81 mg total) by mouth daily.  30 tablet  1  . benazepril-hydrochlorthiazide (LOTENSIN HCT) 20-12.5 MG per tablet Two tablets daily  60 tablet  11  . carvedilol (COREG) 6.25 MG tablet Take 1 tablet (6.25 mg total) by mouth 2 (two) times daily with a meal.  60 tablet  0  . cloNIDine (CATAPRES) 0.2 MG tablet Take 0.2 mg by mouth 3 (three) times daily.      Marland Kitchen FLUoxetine (PROZAC) 10 MG capsule Take 1 capsule (10 mg total) by mouth daily.  30 capsule  3  . furosemide (LASIX) 40 MG tablet Take 1 tablet (40 mg total) by mouth daily. "Fluid pill". For your heart.  30 tablet  3  . insulin glargine (LANTUS) 100 UNIT/ML injection Inject 14 Units into the skin at bedtime. Lantus Pen.      . metFORMIN (GLUCOPHAGE) 1000 MG tablet Take 1 tablet (1,000 mg total) by mouth 2 (two) times daily with a meal.  60 tablet  11   . pantoprazole (PROTONIX) 40 MG tablet Take 1 tablet (40 mg total) by mouth daily.  30 tablet  3  . potassium chloride SA (K-DUR,KLOR-CON) 20 MEQ tablet Take 20 mEq by mouth. One and one half daily      . pravastatin (PRAVACHOL) 40 MG tablet Take 1 tablet (40 mg total) by mouth every evening.  30 tablet  5   No current facility-administered medications for this visit.    Past Medical History  Diagnosis Date  . Alcohol use     Now abstinent.  . Diabetes mellitus, type 2   . Obesity   . Hyperlipidemia   . Hypertension   . Anxiety   . Panic attacks   . Hypercholesteremia   . Diabetic Charcot foot 12/30/2011  . RECTAL BLEEDING, HX OF 12/05/2009    Qualifier: Diagnosis of  By: Diana Eves    . Ankle fracture, lateral malleolus, closed 12/30/2011  . Breast mass, left 12/15/2011  . Tobacco abuse   . ANXIETY 04/17/2010    Qualifier: Diagnosis of  By: Garnette Czech PA, Dawn    . Hypertensive cardiomyopathy 11/08/2012    Ejection fraction 40-45%.  . Diastolic dysfunction 11/08/2012    Grade 1.    Past Surgical History  Procedure Laterality Date  . Dilation and curettage of uterus      Family History  Problem Relation  Age of Onset  . Hypertension Mother   . Hypertension Father     CABG   . Hypertension Sister   . Heart attack Mother   . Arthritis    . Cancer    . Diabetes    . Asthma      History   Social History  . Marital Status: Widowed    Spouse Name: N/A    Number of Children: 2  . Years of Education: N/A   Occupational History  . Not on file.   Social History Main Topics  . Smoking status: Former Smoker -- 1.00 packs/day    Types: Cigarettes    Quit date: 11/01/2012  . Smokeless tobacco: Not on file  . Alcohol Use: No     Comment: stopped jan 2012  . Drug Use: No  . Sexually Active: Yes    Birth Control/ Protection: None   Other Topics Concern  . Not on file   Social History Narrative  . No narrative on file    WJX:BJYNWG of systems complete and found  to be negative unless listed above  PHYSICAL EXAM BP 106/78  Pulse 89  Ht 5\' 1"  (1.549 m)  Wt 192 lb (87.091 kg)  BMI 36.3 kg/m2  General: Well developed, well nourished, in no acute distress Head: Eyes PERRLA, No xanthomas.   Normal cephalic and atramatic  Lungs: Clear bilaterally to auscultation and percussion. Heart: HRRR S1 S2, without MRG.  Pulses are 2+ & equal.            No carotid bruit. No JVD.  No abdominal bruits. No femoral bruits. Abdomen: Bowel sounds are positive, abdomen soft and non-tender without masses or                  Hernia's noted. Msk:  Back normal, normal gait. Normal strength and tone for age.Right knee mildly enlarged from arthritis Extremities: No clubbing, cyanosis or edema.  DP +1 Neuro: Alert and oriented X 3. Psych:  Good affect, responds appropriately  NFA:OZHYQ rhythm with sinus arrythmia. Rate of 74 bpm. LVH.  ASSESSMENT AND PLAN

## 2012-12-20 NOTE — Assessment & Plan Note (Signed)
She is without cardiac complaint. She has gained about 6 lbs since discharge without over edema or dyspnea. She is medically compliant . She states that she weigh herself everyday, but not at a consistent time of day. She may also be eating some salty foods, although she denies adding salt to her food, she is eating some cold cuts and other processed foods. I have discussed proper diet, weighing at the same time every day wearing about the same clothes, writing it down and calling us for weight gain of 3 lbs in 34 hours or 5 lbs in one week. She verbalizes understanding.

## 2013-01-02 ENCOUNTER — Telehealth: Payer: Self-pay | Admitting: Family Medicine

## 2013-01-02 MED ORDER — ALPRAZOLAM 0.5 MG PO TABS: 0.5000 mg | ORAL_TABLET | Freq: Three times a day (TID) | ORAL | Status: DC | PRN

## 2013-01-02 NOTE — Telephone Encounter (Signed)
Med refilled.

## 2013-01-02 NOTE — Telephone Encounter (Signed)
Sent in

## 2013-01-17 ENCOUNTER — Ambulatory Visit: Payer: PRIVATE HEALTH INSURANCE | Admitting: Adult Health

## 2013-02-03 ENCOUNTER — Ambulatory Visit (INDEPENDENT_AMBULATORY_CARE_PROVIDER_SITE_OTHER): Payer: PRIVATE HEALTH INSURANCE | Admitting: Adult Health

## 2013-02-03 ENCOUNTER — Encounter: Payer: Self-pay | Admitting: Adult Health

## 2013-02-03 VITALS — BP 220/130 | HR 86 | Wt 197.0 lb

## 2013-02-03 DIAGNOSIS — I509 Heart failure, unspecified: Secondary | ICD-10-CM

## 2013-02-03 DIAGNOSIS — I10 Essential (primary) hypertension: Secondary | ICD-10-CM

## 2013-02-03 DIAGNOSIS — E785 Hyperlipidemia, unspecified: Secondary | ICD-10-CM

## 2013-02-03 MED ORDER — CLONIDINE HCL 0.3 MG/24HR TD PTWK: 2.0000 | MEDICATED_PATCH | TRANSDERMAL | Status: DC

## 2013-02-03 NOTE — Assessment & Plan Note (Addendum)
Blood pressure is not well-controlled currently. I rechecked her blood pressure in the exam room and found it to be 220/112. On further questioning, she states that she does not always take her medications as directed and sometimes forgets as she is under a lot of stress. She also admits to being a lot of salty foods to include cold cuts, palpitations. I am going to discontinue the clonidine 0.2 mg 3 times a day, and place her on a clonidine TTS patch 0.6 mg weekly. It is my hope that this will help to keep her blood pressure better controlled without the rebound hypertension associated with not taking clonidine as directed. We will see her again in one week for blood pressure check, and in one month for ongoing assessment. I have reminded her on a low salt diet.

## 2013-02-03 NOTE — Progress Notes (Signed)
HPI [name] is a [age]-year-old patient of Dr. 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 She has a history of diabetes medical noncompliance hyperlipidemia OSA and depression with anxiety. She was last seen in the office in February 2014. She is found to be stable with a dry weight of 188 pounds, weight on last office visit 192 pounds. She was advised on daily weights and avoiding salty foods. Followup BMET was completed. all values are found be within normal limits with a mildly elevated creatinine of 1.6.     She is under a lot of stress with social issues. She continues to salty foods. She has many excuses for her status today. She takes Xanax but they are helping. Sometimes forgets to take her medications as directed.  No Known Allergies  Current Outpatient Prescriptions  Medication Sig Dispense Refill  . albuterol (PROVENTIL HFA;VENTOLIN HFA) 108 (90 BASE) MCG/ACT inhaler Inhale 2 puffs into the lungs every 6 (six) hours as needed for wheezing.  1 Inhaler  2  . ALPRAZolam (XANAX) 0.5 MG tablet Take 1 tablet (0.5 mg total) by mouth 3 (three) times daily as needed for anxiety.  30 tablet  1  . amLODipine (NORVASC) 10 MG tablet Take 1 tablet (10 mg total) by mouth daily.  30 tablet  5  . aspirin EC 81 MG EC tablet Take 1 tablet (81 mg total) by mouth daily.  30 tablet  1  . benazepril-hydrochlorthiazide (LOTENSIN HCT) 20-12.5 MG per tablet Two tablets daily  60 tablet  11  . carvedilol (COREG) 6.25 MG tablet Take 1 tablet (6.25 mg total) by mouth 2 (two) times daily with a meal.  60 tablet  0  . cloNIDine (CATAPRES) 0.2 MG tablet Take 0.2 mg by mouth 3 (three) times daily.      Marland Kitchen FLUoxetine (PROZAC) 10 MG capsule Take 1 capsule (10 mg total) by mouth daily.  30 capsule  3  . furosemide (LASIX) 40 MG tablet Take 1 tablet (40 mg total) by mouth daily. "Fluid pill". For your heart.  30 tablet  3  . insulin  glargine (LANTUS) 100 UNIT/ML injection Inject 14 Units into the skin at bedtime. Lantus Pen.      . metFORMIN (GLUCOPHAGE) 1000 MG tablet Take 1 tablet (1,000 mg total) by mouth 2 (two) times daily with a meal.  60 tablet  11  . pantoprazole (PROTONIX) 40 MG tablet Take 1 tablet (40 mg total) by mouth daily.  30 tablet  3  . potassium chloride SA (K-DUR,KLOR-CON) 20 MEQ tablet Take 20 mEq by mouth. One and one half daily      . pravastatin (PRAVACHOL) 40 MG tablet Take 1 tablet (40 mg total) by mouth every evening.  30 tablet  5   No current facility-administered medications for this visit.    Past Medical History  Diagnosis Date  . Alcohol use     Now abstinent.  . Diabetes mellitus, type 2   . Obesity   . Hyperlipidemia   . Hypertension   . Anxiety   . Panic attacks   . Hypercholesteremia   . Diabetic Charcot foot 12/30/2011  . RECTAL BLEEDING, HX OF 12/05/2009    Qualifier: Diagnosis of  By: Diana Eves    . Ankle fracture, lateral malleolus, closed 12/30/2011  . Breast mass, left 12/15/2011  . Tobacco abuse   . ANXIETY 04/17/2010    Qualifier: Diagnosis of  By:  Garnette Czech PA, Dawn    . Hypertensive cardiomyopathy 11/08/2012    Ejection fraction 40-45%.  . Diastolic dysfunction 11/08/2012    Grade 1.    Past Surgical History  Procedure Laterality Date  . Dilation and curettage of uterus      ZOX:WRUEAV of systems complete and found to be negative unless listed above  PHYSICAL EXAM BP 220/130  Pulse 86  Wt 197 lb (89.359 kg)  BMI 37.24 kg/m2  General: Well developed, well nourished, in no acute distress Head: Eyes PERRLA, No xanthomas.   Normal cephalic and atramatic  Lungs: Clear bilaterally to auscultation and percussion. Heart: HRRR S1 S2, with S4.without MRG.  Pulses are 2+ & equal.            No carotid bruit. No JVD.  No abdominal bruits. No femoral bruits. Abdomen: Bowel sounds are positive, abdomen soft and non-tender without masses or                  Hernia's  noted. Msk:  Back normal, normal gait. Normal strength and tone for age. Extremities: No clubbing, cyanosis or edema.  DP +1 Neuro: Alert and oriented X 3. Psych:  Good affect, responds appropriately  EKG:  ASSESSMENT AND PLAN

## 2013-02-03 NOTE — Assessment & Plan Note (Signed)
Review of last labs demonstrated that her cholesterol status was not well controlled as well. Medical noncompliance appears to be her main problem. He will continue on the pravastatin 40 mg daily and recheck her fasting lipids and LFTs on next visit.

## 2013-02-03 NOTE — Progress Notes (Deleted)
Name: [name]    DOB: 1966-06-03  Age: 47 y.o.  MR#: [id number]       PCP:  Syliva Overman, MD      Insurance: Payor: [employer] MEDICARE  Plan: [facility]  Product Type: *No Product type*    CC:    Chief Complaint  Patient presents with  . Cardiomyopathy  . Hypertension    VS Filed Vitals:   02/03/13 1316  BP: 220/130  Pulse: 86  Weight: 197 lb (89.359 kg)    Weights Current Weight  02/03/13 197 lb (89.359 kg)  12/20/12 192 lb (87.091 kg)  12/06/12 188 lb 1.9 oz (85.331 kg)    Blood Pressure  BP Readings from Last 3 Encounters:  02/03/13 220/130  12/20/12 106/78  12/06/12 120/90     Admit date:  (Not on file) Last encounter with RMR:  01/17/2013   Allergy Review of patient's allergies indicates no known allergies.  Current Outpatient Prescriptions  Medication Sig Dispense Refill  . albuterol (PROVENTIL HFA;VENTOLIN HFA) 108 (90 BASE) MCG/ACT inhaler Inhale 2 puffs into the lungs every 6 (six) hours as needed for wheezing.  1 Inhaler  2  . ALPRAZolam (XANAX) 0.5 MG tablet Take 1 tablet (0.5 mg total) by mouth 3 (three) times daily as needed for anxiety.  30 tablet  1  . amLODipine (NORVASC) 10 MG tablet Take 1 tablet (10 mg total) by mouth daily.  30 tablet  5  . aspirin EC 81 MG EC tablet Take 1 tablet (81 mg total) by mouth daily.  30 tablet  1  . benazepril-hydrochlorthiazide (LOTENSIN HCT) 20-12.5 MG per tablet Two tablets daily  60 tablet  11  . carvedilol (COREG) 6.25 MG tablet Take 1 tablet (6.25 mg total) by mouth 2 (two) times daily with a meal.  60 tablet  0  . cloNIDine (CATAPRES) 0.2 MG tablet Take 0.2 mg by mouth 3 (three) times daily.      Marland Kitchen FLUoxetine (PROZAC) 10 MG capsule Take 1 capsule (10 mg total) by mouth daily.  30 capsule  3  . furosemide (LASIX) 40 MG tablet Take 1 tablet (40 mg total) by mouth daily. "Fluid pill". For your heart.  30 tablet  3  . insulin glargine (LANTUS) 100 UNIT/ML injection Inject 14 Units into the skin at  bedtime. Lantus Pen.      . metFORMIN (GLUCOPHAGE) 1000 MG tablet Take 1 tablet (1,000 mg total) by mouth 2 (two) times daily with a meal.  60 tablet  11  . pantoprazole (PROTONIX) 40 MG tablet Take 1 tablet (40 mg total) by mouth daily.  30 tablet  3  . potassium chloride SA (K-DUR,KLOR-CON) 20 MEQ tablet Take 20 mEq by mouth. One and one half daily      . pravastatin (PRAVACHOL) 40 MG tablet Take 1 tablet (40 mg total) by mouth every evening.  30 tablet  5   No current facility-administered medications for this visit.    Discontinued Meds:   There are no discontinued medications.  Patient Active Problem List  Diagnosis  . DM (diabetes mellitus), type 2, uncontrolled  . HYPERLIPIDEMIA  . OBESITY  . ANXIETY  . ALCOHOL USE  . DEPRESSION  . HYPERTENSION  . SHOULDER PAIN, RIGHT  . BACK PAIN  . BURSITIS, RIGHT SHOULDER  . INSOMNIA  . FATIGUE  . RECTAL BLEEDING, HX OF  . NICOTINE ADDICTION  . Breast mass, left  . Vitamin d deficiency  . Diabetic Charcot foot  .  Ankle fracture, lateral malleolus, closed  . Medical non-compliance  . Closed left ankle fracture  . Foot pain  . Leg ulcer, left  . Shoulder pain, right  . Poor mobility  . Muscle weakness (generalized)  . Rotator cuff syndrome of right shoulder  . CAP (community acquired pneumonia)  . HTN (hypertension), malignant  . Hypokalemia  . Hypertensive cardiomyopathy  . CHF (congestive heart failure)  . Respiratory distress  . Acute respiratory failure  . Acute bronchitis  . Routine general medical examination at a health care facility    LABS    Component Value Date/Time   NA 136 12/20/2012 1152   NA 136 12/06/2012 1516   NA 137 11/12/2012 0618   K 4.6 12/20/2012 1152   K 5.3 12/06/2012 1516   K 5.0 11/12/2012 0618   CL 100 12/20/2012 1152   CL 103 12/06/2012 1516   CL 102 11/12/2012 0618   CO2 22 12/20/2012 1152   CO2 21 12/06/2012 1516   CO2 25 11/12/2012 0618   GLUCOSE 246* 12/20/2012 1152   GLUCOSE 217*  12/06/2012 1516   GLUCOSE 162* 11/12/2012 0618   BUN 25* 12/20/2012 1152   BUN 36* 12/06/2012 1516   BUN 31* 11/12/2012 0618   CREATININE 1.61* 12/20/2012 1152   CREATININE 1.38* 12/06/2012 1516   CREATININE 1.07 11/12/2012 0618   CREATININE 0.89 11/11/2012 0536   CREATININE 0.79 11/10/2012 0542   CREATININE 0.84 11/01/2012 1443   CALCIUM 9.2 12/20/2012 1152   CALCIUM 10.2 12/06/2012 1516   CALCIUM 9.5 11/12/2012 0618   GFRNONAA 60* 11/12/2012 0618   GFRNONAA 75* 11/11/2012 0536   GFRNONAA >90 11/10/2012 0542   GFRAA 70* 11/12/2012 0618   GFRAA 87* 11/11/2012 0536   GFRAA >90 11/10/2012 0542   CMP     Component Value Date/Time   NA 136 12/20/2012 1152   K 4.6 12/20/2012 1152   CL 100 12/20/2012 1152   CO2 22 12/20/2012 1152   GLUCOSE 246* 12/20/2012 1152   BUN 25* 12/20/2012 1152   CREATININE 1.61* 12/20/2012 1152   CREATININE 1.07 11/12/2012 0618   CALCIUM 9.2 12/20/2012 1152   PROT 6.9 11/09/2012 0642   ALBUMIN 2.9* 11/09/2012 0642   AST 24 11/09/2012 0642   ALT 13 11/09/2012 0642   ALKPHOS 84 11/09/2012 0642   BILITOT 0.8 11/09/2012 0642   GFRNONAA 60* 11/12/2012 0618   GFRAA 70* 11/12/2012 0618       Component Value Date/Time   WBC 4.5 11/10/2012 0542   WBC 5.5 11/09/2012 0642   WBC 5.3 11/08/2012 2013   HGB 15.6* 11/10/2012 0542   HGB 16.0* 11/09/2012 0642   HGB 16.3* 11/08/2012 2013   HCT 45.6 11/10/2012 0542   HCT 45.6 11/09/2012 0642   HCT 45.9 11/08/2012 2013   MCV 97.6 11/10/2012 0542   MCV 96.0 11/09/2012 0642   MCV 95.4 11/08/2012 2013    Lipid Panel     Component Value Date/Time   CHOL 224* 11/08/2012 0534   TRIG 184* 11/08/2012 0534   HDL 50 11/08/2012 0534   CHOLHDL 4.5 11/08/2012 0534   VLDL 37 11/08/2012 0534   LDLCALC 137* 11/08/2012 0534    ABG    Component Value Date/Time   PHART 7.462* 11/08/2012 2113   PCO2ART 39.7 11/08/2012 2113   PO2ART 59.7* 11/08/2012 2113   HCO3 28.0* 11/08/2012 2113   TCO2 23.7 11/08/2012 2113   O2SAT 89.7 11/08/2012 2113     Lab Results  Component  Value  Date   TSH 1.379 11/07/2012   BNP (last 3 results)  Recent Labs  11/07/12 1153 11/08/12 2013 11/11/12 0536  PROBNP 3256.0* 725.3* 864.1*   Cardiac Panel (last 3 results) No results found for this basename: CKTOTAL, CKMB, TROPONINI, RELINDX,  in the last 72 hours  Iron/TIBC/Ferritin No results found for this basename: iron, tibc, ferritin     EKG Orders placed in visit on 12/20/12  . EKG 12-LEAD     Prior Assessment and Plan Problem List as of 02/03/2013     ICD-9-CM     Cardiology Problems   HTN (hypertension), malignant   Last Assessment & Plan   12/20/2012 Office Visit Written 12/20/2012 12:56 PM by Jodelle Gross, NP     Currently well controlled on medications prescribed. She is tolerating them well.     HYPERLIPIDEMIA   Last Assessment & Plan   12/06/2012 Office Visit Written 12/07/2012  5:49 PM by Kerri Perches, MD     Uncontrolled. Hyperlipidemia:Low fat diet discussed and encouraged.  Updated lab next visit    HYPERTENSION   Last Assessment & Plan   11/01/2012 Office Visit Written 11/17/2012 12:09 AM by Kerri Perches, MD     Uncontrolled, consistently dangerously high. Meds precribed and pt to collect and start taking them Advised Ed eval if developed headache, numbness or weakness. DASH diet and commitment to daily physical activity for a minimum of 30 minutes discussed and encouraged, as a part of hypertension management. The importance of attaining a healthy weight is also discussed. This is a recurrent problem she takes no medications    Hypertensive cardiomyopathy   Last Assessment & Plan   12/20/2012 Office Visit Written 12/20/2012 12:55 PM by Jodelle Gross, NP     She is without cardiac complaint. She has gained about 6 lbs since discharge without over edema or dyspnea. She is medically compliant . She states that she weigh herself everyday, but not at a consistent time of day. She may also be eating some salty foods, although she  denies adding salt to her food, she is eating some cold cuts and other processed foods. I have discussed proper diet, weighing at the same time every day wearing about the same clothes, writing it down and calling us for weight gain of 3 lbs in 34 hours or 5 lbs in one week. She verbalizes understanding.     CHF (congestive heart failure)   Last Assessment & Plan   12/20/2012 Office Visit Written 12/20/2012 12:57 PM by Jodelle Gross, NP     She is well compensated on her fluid status. No evidence of fluid overload. Follow up BMET is requested to evaluated kidney status on current diuretic and ACE.      Other   DM (diabetes mellitus), type 2, uncontrolled   Last Assessment & Plan   12/06/2012 Office Visit Written 12/07/2012  5:49 PM by Kerri Perches, MD     appt with endo for follow up to be made on pt's behalf. Patient advised to reduce carb and sweets, commit to regular physical activity, take meds as prescribed, test blood as directed, and attempt to lose weight, to improve blood sugar control.     OBESITY   Last Assessment & Plan   12/06/2012 Office Visit Written 12/07/2012  5:52 PM by Kerri Perches, MD     Improved. Pt applauded on succesful weight loss through lifestyle change, and encouraged to continue same. Weight loss goal set for  the next several months.     ANXIETY   Last Assessment & Plan   12/06/2012 Office Visit Written 12/07/2012  5:50 PM by Kerri Perches, MD     Controlled, no change in medication     ALCOHOL USE   DEPRESSION   Last Assessment & Plan   12/06/2012 Office Visit Written 12/07/2012  5:51 PM by Kerri Perches, MD     Improved on medication, now has hope , wants to change living environ, will reschedule with psych for therapyt, wa hospitalized attht e time she had her appt    SHOULDER PAIN, RIGHT   Last Assessment & Plan   05/10/2012 Office Visit Edited 05/10/2012  7:18 PM by Kerri Perches, MD     Increased with reduced mobility,  order MRI, toradol administered at visit    BACK PAIN   BURSITIS, RIGHT SHOULDER   INSOMNIA   FATIGUE   RECTAL BLEEDING, HX OF   NICOTINE ADDICTION   Last Assessment & Plan   11/01/2012 Office Visit Written 11/01/2012  2:42 PM by Kerri Perches, MD     Patient counseled for approximately 5 minutes regarding the health risks of ongoing nicotine use, specifically all types of cancer, heart disease, stroke and respiratory failure. The options available for help with cessation ,the behavioral changes to assist the process, and the option to either gradully reduce usage  Or abruptly stop.is also discussed. Pt is also encouraged to set specific goals in number of cigarettes used daily, as well as to set a quit date.     Breast mass, left   Last Assessment & Plan   11/01/2012 Office Visit Written 11/17/2012 12:11 AM by Kerri Perches, MD     Pt referred to surgery for re evaluation    Vitamin d deficiency   Diabetic Charcot foot   Ankle fracture, lateral malleolus, closed   Medical non-compliance   Last Assessment & Plan   04/18/2012 Office Visit Written 04/24/2012  3:51 PM by Kerri Perches, MD     An ongoing issue, the only medication currently reports taking is "pain med" and always requesting this, although I am not responsible for the script    Closed left ankle fracture   Foot pain   Leg ulcer, left   Last Assessment & Plan   04/18/2012 Office Visit Written 04/24/2012  3:54 PM by Kerri Perches, MD     Malodorous leg ulcer obviously infected, wound cleaned with saline and gauze dressing applied, rocephin administered in office, antibiotics prescribed and pt referred to the wound center    Shoulder pain, right   Poor mobility   Last Assessment & Plan   05/10/2012 Office Visit Written 05/10/2012  7:12 PM by Kerri Perches, MD     Requesting power wheelchair , will refer to physical therapy for further eval, she does have ankle pathology, left, left leg ulcer and is now c/o  right shoulder debility    Muscle weakness (generalized)   Rotator cuff syndrome of right shoulder   CAP (community acquired pneumonia)   Hypokalemia   Respiratory distress   Acute respiratory failure   Acute bronchitis   Routine general medical examination at a health care facility   Last Assessment & Plan   11/01/2012 Office Visit Written 11/17/2012 12:15 AM by Kerri Perches, MD     Pelvic and breast exam completed as documented, pap sent, rectal refused. Pt encouraged to commit to med compliance and also is  referred to mental health        Imaging: No results found.

## 2013-02-03 NOTE — Patient Instructions (Addendum)
Your physician recommends that you schedule a follow-up appointment in:  1 - 1 week for a blood pressure check 2 - 1 month with [name]  Your physician has recommended you make the following change in your medication:  1 - STOP clonidine tabs 2 - START Clonidine patch 0.3 mg 2 patches weekly

## 2013-02-03 NOTE — Assessment & Plan Note (Signed)
No evidence of CHF on this evaluation. She has no wheezes or rhonchi. There is no lower extremity edema. She will continue her diuretic regimen as directed.

## 2013-02-08 ENCOUNTER — Other Ambulatory Visit: Payer: Self-pay

## 2013-02-08 MED ORDER — ALPRAZOLAM 0.5 MG PO TABS: 0.5000 mg | ORAL_TABLET | Freq: Three times a day (TID) | ORAL | Status: DC | PRN

## 2013-02-09 ENCOUNTER — Ambulatory Visit (INDEPENDENT_AMBULATORY_CARE_PROVIDER_SITE_OTHER): Payer: PRIVATE HEALTH INSURANCE | Admitting: *Deleted

## 2013-02-09 ENCOUNTER — Other Ambulatory Visit: Payer: Self-pay | Admitting: *Deleted

## 2013-02-09 VITALS — BP 200/130 | HR 88 | Wt 197.0 lb

## 2013-02-09 DIAGNOSIS — I10 Essential (primary) hypertension: Secondary | ICD-10-CM

## 2013-02-09 MED ORDER — BLOOD PRESSURE MONITOR/L CUFF MISC: 1.0000 | Freq: Once | Status: DC

## 2013-02-09 NOTE — Progress Notes (Signed)
Presents for blood pressure check s/p OV on 4/11, where she was supposed to begin clonidine patches and did not.  Patient also states that she has not had any of her other medications for 2 days.  Advised her to fill patches on her way home, and take all of her other meds and return at 4:00 pm for a repeat check.  Blood pressure check repeated and is still 200/130.  Consulted with Dr Dietrich Pates and we will give clonidine patches time to work and bring her back Monday for BP check, as patient refused to go to the ED, as suggested by Joni Reining, NP.  Per Dr Dietrich Pates, will back clonidine patch off to 1 patch weekly.

## 2013-02-14 ENCOUNTER — Telehealth: Payer: Self-pay | Admitting: *Deleted

## 2013-02-14 NOTE — Telephone Encounter (Signed)
Noted pt was scheduled for BP yesterday at 4pm per noted concerns on BP check on 02-10-13, pt declined to go to the ED as directed by [employer] NP and later discussed options with DR RR about importance of medication management per pt noted has not taken BP medication in 2 days per in hospital with father, left vm to have pt contact office in reference to current condition and to reschedule her BP check

## 2013-02-14 NOTE — Telephone Encounter (Signed)
.  left message to have patient return my call. DR RR made aware this am about pt no show for BP Check KL NP made aware this afternoon about inability to reach pt once again, noted pt father in hospital, will continue to reach out to update concerns with elevated BP

## 2013-02-16 ENCOUNTER — Encounter: Payer: Self-pay | Admitting: *Deleted

## 2013-02-16 NOTE — Telephone Encounter (Signed)
.  left message to have patient return my call. Sent pt a certified letter per concerned for her health, will await letter receipt and pt call

## 2013-02-20 NOTE — Telephone Encounter (Signed)
Noted receipt for pt certified mail received/signed ID#[id number]

## 2013-02-21 ENCOUNTER — Telehealth: Payer: Self-pay | Admitting: Family Medicine

## 2013-02-21 NOTE — Telephone Encounter (Signed)
Called pharmacy and spoke with scott who confirmed that no rx was received.  Verbal given for xanax 0.5mg  #90 with no refills TID prn

## 2013-02-24 ENCOUNTER — Encounter: Payer: PRIVATE HEALTH INSURANCE | Admitting: Adult Health

## 2013-02-24 NOTE — Progress Notes (Signed)
NO Show

## 2013-03-08 ENCOUNTER — Ambulatory Visit (INDEPENDENT_AMBULATORY_CARE_PROVIDER_SITE_OTHER): Payer: PRIVATE HEALTH INSURANCE | Admitting: Family Medicine

## 2013-03-08 ENCOUNTER — Encounter: Payer: Self-pay | Admitting: Family Medicine

## 2013-03-08 VITALS — BP 182/120 | HR 96 | Resp 18 | Ht 62.0 in | Wt 195.0 lb

## 2013-03-08 DIAGNOSIS — T788XXS Other adverse effects, not elsewhere classified, sequela: Secondary | ICD-10-CM

## 2013-03-08 DIAGNOSIS — E1142 Type 2 diabetes mellitus with diabetic polyneuropathy: Secondary | ICD-10-CM

## 2013-03-08 DIAGNOSIS — IMO0001 Reserved for inherently not codable concepts without codable children: Secondary | ICD-10-CM

## 2013-03-08 DIAGNOSIS — T7589XS Other specified effects of external causes, sequela: Secondary | ICD-10-CM

## 2013-03-08 DIAGNOSIS — E1149 Type 2 diabetes mellitus with other diabetic neurological complication: Secondary | ICD-10-CM

## 2013-03-08 DIAGNOSIS — T7491XS Unspecified adult maltreatment, confirmed, sequela: Secondary | ICD-10-CM

## 2013-03-08 DIAGNOSIS — I10 Essential (primary) hypertension: Secondary | ICD-10-CM

## 2013-03-08 DIAGNOSIS — E1140 Type 2 diabetes mellitus with diabetic neuropathy, unspecified: Secondary | ICD-10-CM

## 2013-03-08 DIAGNOSIS — F17200 Nicotine dependence, unspecified, uncomplicated: Secondary | ICD-10-CM

## 2013-03-08 DIAGNOSIS — T7491XA Unspecified adult maltreatment, confirmed, initial encounter: Secondary | ICD-10-CM | POA: Insufficient documentation

## 2013-03-08 DIAGNOSIS — E1165 Type 2 diabetes mellitus with hyperglycemia: Secondary | ICD-10-CM

## 2013-03-08 DIAGNOSIS — E11610 Type 2 diabetes mellitus with diabetic neuropathic arthropathy: Secondary | ICD-10-CM

## 2013-03-08 NOTE — Patient Instructions (Addendum)
F/u in 5 weeks  You NEED to take the three blood pressure tablets and the weekly patch as prescribed.Blood pressure today is extremely high  You NEED to look for a safe living arrangement  Please provide your daughter's name and contact info so we can discuss your health issues with her when needed. Ask her to assist in you in finding a SAFE living environment  We will see if you can be re tested so soon for the need for nocturnal oxygen since ou state you are unable to afford this   Microalb today  You are referred to Dr Romeo Apple as requested
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
## 2013-03-09 LAB — MICROALBUMIN / CREATININE URINE RATIO
Creatinine, Urine: 29.1 mg/dL
Microalb Creat Ratio: 850.9 mg/g — ABNORMAL HIGH (ref 0.0–30.0)
Microalb, Ur: 24.76 mg/dL — ABNORMAL HIGH (ref 0.00–1.89)

## 2013-03-10 MED ORDER — BENAZEPRIL HCL 20 MG PO TABS: 20.0000 mg | ORAL_TABLET | Freq: Every day | ORAL | Status: DC

## 2013-03-10 MED ORDER — ALPRAZOLAM 0.5 MG PO TABS: 0.5000 mg | ORAL_TABLET | Freq: Three times a day (TID) | ORAL | Status: DC | PRN

## 2013-03-20 DIAGNOSIS — E1140 Type 2 diabetes mellitus with diabetic neuropathy, unspecified: Secondary | ICD-10-CM | POA: Insufficient documentation
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
## 2013-03-20 NOTE — Assessment & Plan Note (Signed)
Increased pain and swelling requests orhto re eval same arranged

## 2013-03-20 NOTE — Assessment & Plan Note (Signed)
Uncontrolled, non compliance continues to be a problem DASH diet and commitment to daily physical activity for a minimum of 30 minutes discussed and encouraged, as a part of hypertension management. The importance of attaining a healthy weight is also discussed.

## 2013-03-20 NOTE — Assessment & Plan Note (Signed)
Poor sensation in feet, counseled re importance of daily foot inspection and improved blood sugar control

## 2013-03-20 NOTE — Assessment & Plan Note (Signed)
Updated lab needed, and needs to re establish with endo Patient advised to reduce carb and sweets, commit to regular physical activity, take meds as prescribed, test blood as directed, and attempt to lose weight, to improve blood sugar control.

## 2013-03-20 NOTE — Assessment & Plan Note (Deleted)
Unchanged. Patient counseled for approximately 5 minutes regarding the health risks of ongoing nicotine use, specifically all types of cancer, heart disease, stroke and respiratory failure. The options available for help with cessation ,the behavioral changes to assist the process, and the option to either gradully reduce usage  Or abruptly stop.is also discussed. Pt is also encouraged to set specific goals in number of cigarettes used daily, as well as to set a quit date.  

## 2013-03-22 ENCOUNTER — Other Ambulatory Visit: Payer: Self-pay | Admitting: Family Medicine

## 2013-03-24 ENCOUNTER — Other Ambulatory Visit: Payer: Self-pay | Admitting: Family Medicine

## 2013-03-27 ENCOUNTER — Telehealth: Payer: Self-pay | Admitting: Family Medicine

## 2013-03-29 ENCOUNTER — Other Ambulatory Visit: Payer: Self-pay

## 2013-03-29 MED ORDER — ALPRAZOLAM 0.5 MG PO TABS: 0.5000 mg | ORAL_TABLET | Freq: Three times a day (TID) | ORAL | Status: DC | PRN

## 2013-03-30 NOTE — Telephone Encounter (Signed)
Patient is aware 

## 2013-04-07 ENCOUNTER — Telehealth: Payer: Self-pay | Admitting: Family Medicine

## 2013-04-07 NOTE — Telephone Encounter (Signed)
Patient is aware of this appointment

## 2013-04-25 ENCOUNTER — Ambulatory Visit: Payer: PRIVATE HEALTH INSURANCE | Admitting: Family Medicine

## 2013-04-26 ENCOUNTER — Telehealth: Payer: Self-pay | Admitting: Family Medicine

## 2013-04-26 ENCOUNTER — Other Ambulatory Visit: Payer: Self-pay | Admitting: Family Medicine

## 2013-04-27 NOTE — Telephone Encounter (Signed)
Med refilled for 30 days only and sent to [facility]

## 2013-05-04 ENCOUNTER — Other Ambulatory Visit: Payer: Self-pay

## 2013-05-08 ENCOUNTER — Encounter: Payer: Self-pay | Admitting: Family Medicine

## 2013-05-08 ENCOUNTER — Other Ambulatory Visit: Payer: Self-pay | Admitting: Family Medicine

## 2013-05-08 ENCOUNTER — Ambulatory Visit (INDEPENDENT_AMBULATORY_CARE_PROVIDER_SITE_OTHER): Payer: PRIVATE HEALTH INSURANCE | Admitting: Family Medicine

## 2013-05-08 VITALS — BP 180/110 | HR 101 | Resp 16 | Ht 62.0 in | Wt 195.0 lb

## 2013-05-08 DIAGNOSIS — F411 Generalized anxiety disorder: Secondary | ICD-10-CM

## 2013-05-08 DIAGNOSIS — F329 Major depressive disorder, single episode, unspecified: Secondary | ICD-10-CM

## 2013-05-08 DIAGNOSIS — F17200 Nicotine dependence, unspecified, uncomplicated: Secondary | ICD-10-CM

## 2013-05-08 DIAGNOSIS — E669 Obesity, unspecified: Secondary | ICD-10-CM

## 2013-05-08 DIAGNOSIS — Z9119 Patient's noncompliance with other medical treatment and regimen: Secondary | ICD-10-CM

## 2013-05-08 DIAGNOSIS — E1065 Type 1 diabetes mellitus with hyperglycemia: Secondary | ICD-10-CM

## 2013-05-08 DIAGNOSIS — I10 Essential (primary) hypertension: Secondary | ICD-10-CM

## 2013-05-08 DIAGNOSIS — E785 Hyperlipidemia, unspecified: Secondary | ICD-10-CM

## 2013-05-08 DIAGNOSIS — Z139 Encounter for screening, unspecified: Secondary | ICD-10-CM

## 2013-05-08 DIAGNOSIS — IMO0001 Reserved for inherently not codable concepts without codable children: Secondary | ICD-10-CM

## 2013-05-08 NOTE — Patient Instructions (Addendum)
F/u in  3.5 month, call if you need me before  Blood pressure is high since you are not filling medication on time,it is important you do this  Labs today , hBA1c, cmp and EGFR  PLease get back into mental health for depression and anxiety  Please look into alernate living situation, you NEED this   Work on smoking cessation  We will provide a list of alternate housing situations you can check on
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
## 2013-05-09 LAB — COMPLETE METABOLIC PANEL WITH GFR
ALT: 15 U/L (ref 0–35)
AST: 20 U/L (ref 0–37)
Albumin: 4 g/dL (ref 3.5–5.2)
Alkaline Phosphatase: 105 U/L (ref 39–117)
BUN: 9 mg/dL (ref 6–23)
CO2: 26 mEq/L (ref 19–32)
Calcium: 9.2 mg/dL (ref 8.4–10.5)
Chloride: 100 mEq/L (ref 96–112)
Creat: 0.85 mg/dL (ref 0.50–1.10)
GFR, Est African American: >89 mL/min
GFR, Est Non African American: 81 mL/min
Glucose, Bld: 225 mg/dL — ABNORMAL HIGH (ref 70–99)
Potassium: 4.1 mEq/L (ref 3.5–5.3)
Sodium: 140 mEq/L (ref 135–145)
Total Bilirubin: 0.8 mg/dL (ref 0.3–1.2)
Total Protein: 7 g/dL (ref 6.0–8.3)

## 2013-05-09 LAB — HEMOGLOBIN A1C
Hgb A1c MFr Bld: 9.8 % — ABNORMAL HIGH (ref <5.7)
Mean Plasma Glucose: 235 mg/dL — ABNORMAL HIGH (ref <117)

## 2013-05-22 NOTE — Assessment & Plan Note (Signed)
Uncontrolled, due to non compliance with medication. Ongoing associated risks of malignant HTN  Which is untreated are discussed

## 2013-05-22 NOTE — Assessment & Plan Note (Signed)
Uncontrolled, non compliant, lives in an unhealthy situation, initially stated she was ready to change , then changed her mind Needs to go to therapy as well, agrees to do so

## 2013-05-22 NOTE — Assessment & Plan Note (Signed)
Uncontrolled, with complications, needs to re establish with endo and comply with meds and diet Lab today

## 2013-05-22 NOTE — Assessment & Plan Note (Addendum)
Ongoing medical non compliance, never fills meds prescribed, continues to present with uncontrolled hypertension The dangers of this are again discussed. Pt with multiple severe medical conditions which remain uncontrolled , will continue to attempt to effect positive change. She is competent to make her own decisions at this time, although they do continue to be harmful to her,

## 2013-05-26 ENCOUNTER — Other Ambulatory Visit: Payer: Self-pay

## 2013-05-26 ENCOUNTER — Telehealth: Payer: Self-pay | Admitting: Family Medicine

## 2013-05-26 ENCOUNTER — Other Ambulatory Visit: Payer: Self-pay | Admitting: Family Medicine

## 2013-05-26 DIAGNOSIS — E785 Hyperlipidemia, unspecified: Secondary | ICD-10-CM

## 2013-05-26 DIAGNOSIS — E119 Type 2 diabetes mellitus without complications: Secondary | ICD-10-CM

## 2013-05-26 DIAGNOSIS — I10 Essential (primary) hypertension: Secondary | ICD-10-CM

## 2013-05-26 DIAGNOSIS — F329 Major depressive disorder, single episode, unspecified: Secondary | ICD-10-CM

## 2013-05-26 MED ORDER — FLUOXETINE HCL 10 MG PO CAPS: 10.0000 mg | ORAL_CAPSULE | Freq: Every day | ORAL | Status: DC

## 2013-05-26 MED ORDER — PANTOPRAZOLE SODIUM 40 MG PO TBEC: 40.0000 mg | DELAYED_RELEASE_TABLET | Freq: Every day | ORAL | Status: DC

## 2013-05-26 MED ORDER — FUROSEMIDE 40 MG PO TABS: 40.0000 mg | ORAL_TABLET | Freq: Every day | ORAL | Status: DC

## 2013-05-26 MED ORDER — METFORMIN HCL 1000 MG PO TABS: 1000.0000 mg | ORAL_TABLET | Freq: Two times a day (BID) | ORAL | Status: DC

## 2013-05-26 MED ORDER — AMLODIPINE BESYLATE 10 MG PO TABS: 10.0000 mg | ORAL_TABLET | Freq: Every day | ORAL | Status: DC

## 2013-05-26 MED ORDER — CLONIDINE HCL 0.3 MG PO TABS: 0.3000 mg | ORAL_TABLET | Freq: Three times a day (TID) | ORAL | Status: DC

## 2013-05-26 MED ORDER — PRAVASTATIN SODIUM 40 MG PO TABS: 40.0000 mg | ORAL_TABLET | Freq: Every evening | ORAL | Status: DC

## 2013-05-26 MED ORDER — BENAZEPRIL HCL 20 MG PO TABS: 20.0000 mg | ORAL_TABLET | Freq: Every day | ORAL | Status: DC

## 2013-05-26 NOTE — Telephone Encounter (Signed)
Med refilled to [facility] as requested

## 2013-05-26 NOTE — Telephone Encounter (Signed)
Med refilled with 3 refills to [facility]

## 2013-06-09 ENCOUNTER — Ambulatory Visit (HOSPITAL_COMMUNITY)
Admission: RE | Admit: 2013-06-09 | Discharge: 2013-06-09 | Disposition: A | Discharge status: Home / Self Care | Payer: PRIVATE HEALTH INSURANCE | Source: Ambulatory Visit | Attending: Family Medicine | Admitting: Family Medicine

## 2013-06-09 DIAGNOSIS — Z139 Encounter for screening, unspecified: Secondary | ICD-10-CM

## 2013-06-09 DIAGNOSIS — Z1231 Encounter for screening mammogram for malignant neoplasm of breast: Secondary | ICD-10-CM | POA: Insufficient documentation

## 2013-06-10 NOTE — Assessment & Plan Note (Signed)
Updated lab needed, uncontrolled when last checked. Pt is non compliant with meds so most likely still uncontrolled Hyperlipidemia:Low fat diet discussed and encouraged.

## 2013-06-10 NOTE — Assessment & Plan Note (Signed)
Uncontrolled, worsening, needs psychiatric care, no threat to herself or others at this time

## 2013-06-10 NOTE — Assessment & Plan Note (Signed)
Unchanged and unwilling to quit currently Patient counseled for approximately 5 minutes regarding the health risks of ongoing nicotine use, specifically all types of cancer, heart disease, stroke and respiratory failure. The options available for help with cessation ,the behavioral changes to assist the process, and the option to either gradully reduce usage  Or abruptly stop.is also discussed. Pt is also encouraged to set specific goals in number of cigarettes used daily, as well as to set a quit date.

## 2013-06-10 NOTE — Assessment & Plan Note (Signed)
Unchanged. Patient re-educated about  the importance of commitment to a  minimum of 150 minutes of exercise per week. The importance of healthy food choices with portion control discussed. Encouraged to start a food diary, count calories and to consider  joining a support group. Sample diet sheets offered. Goals set by the patient for the next several months.    

## 2013-07-01 IMAGING — CR DG ANKLE COMPLETE 3+V*L*
3 series · 3 of 3 positions shown · non-contrast
Comparison: None.

CLINICAL DATA: Fall with left ankle pain and swelling.

LEFT ANKLE COMPLETE - 3+ VIEW

[view not recorded (1 of 3)]
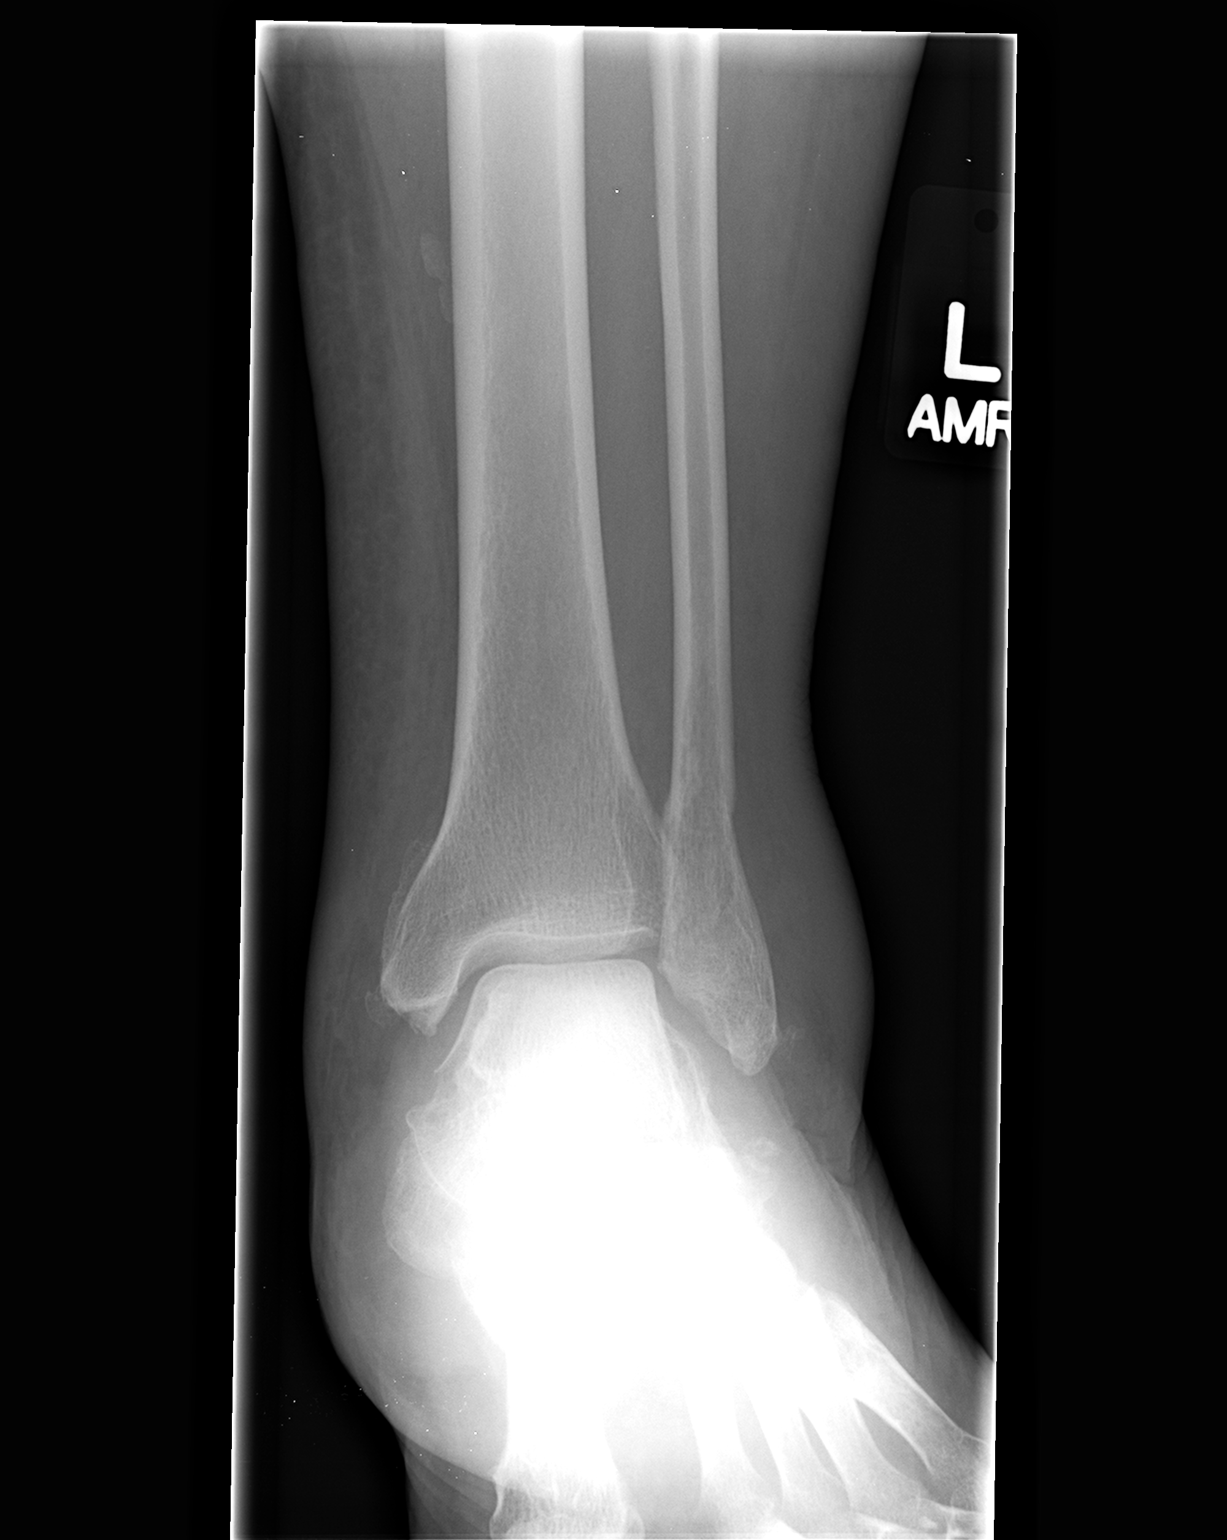

[view not recorded (2 of 3)]
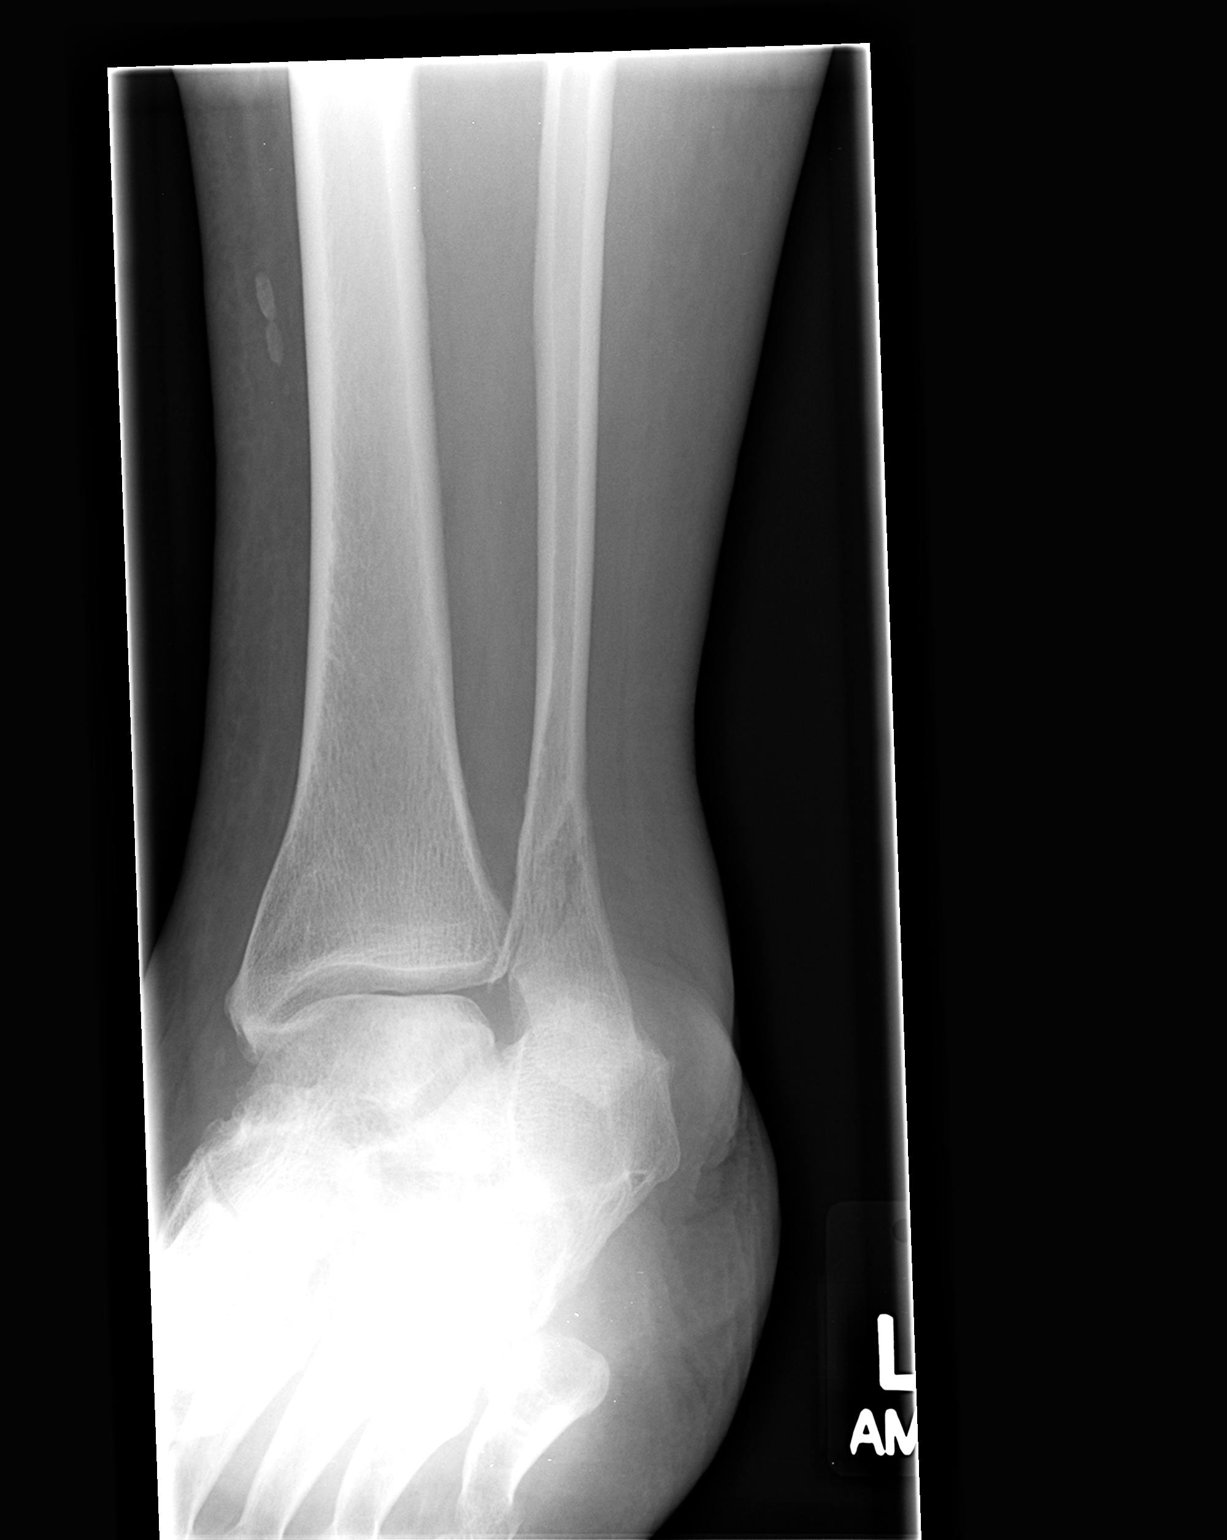

[view not recorded (3 of 3)]
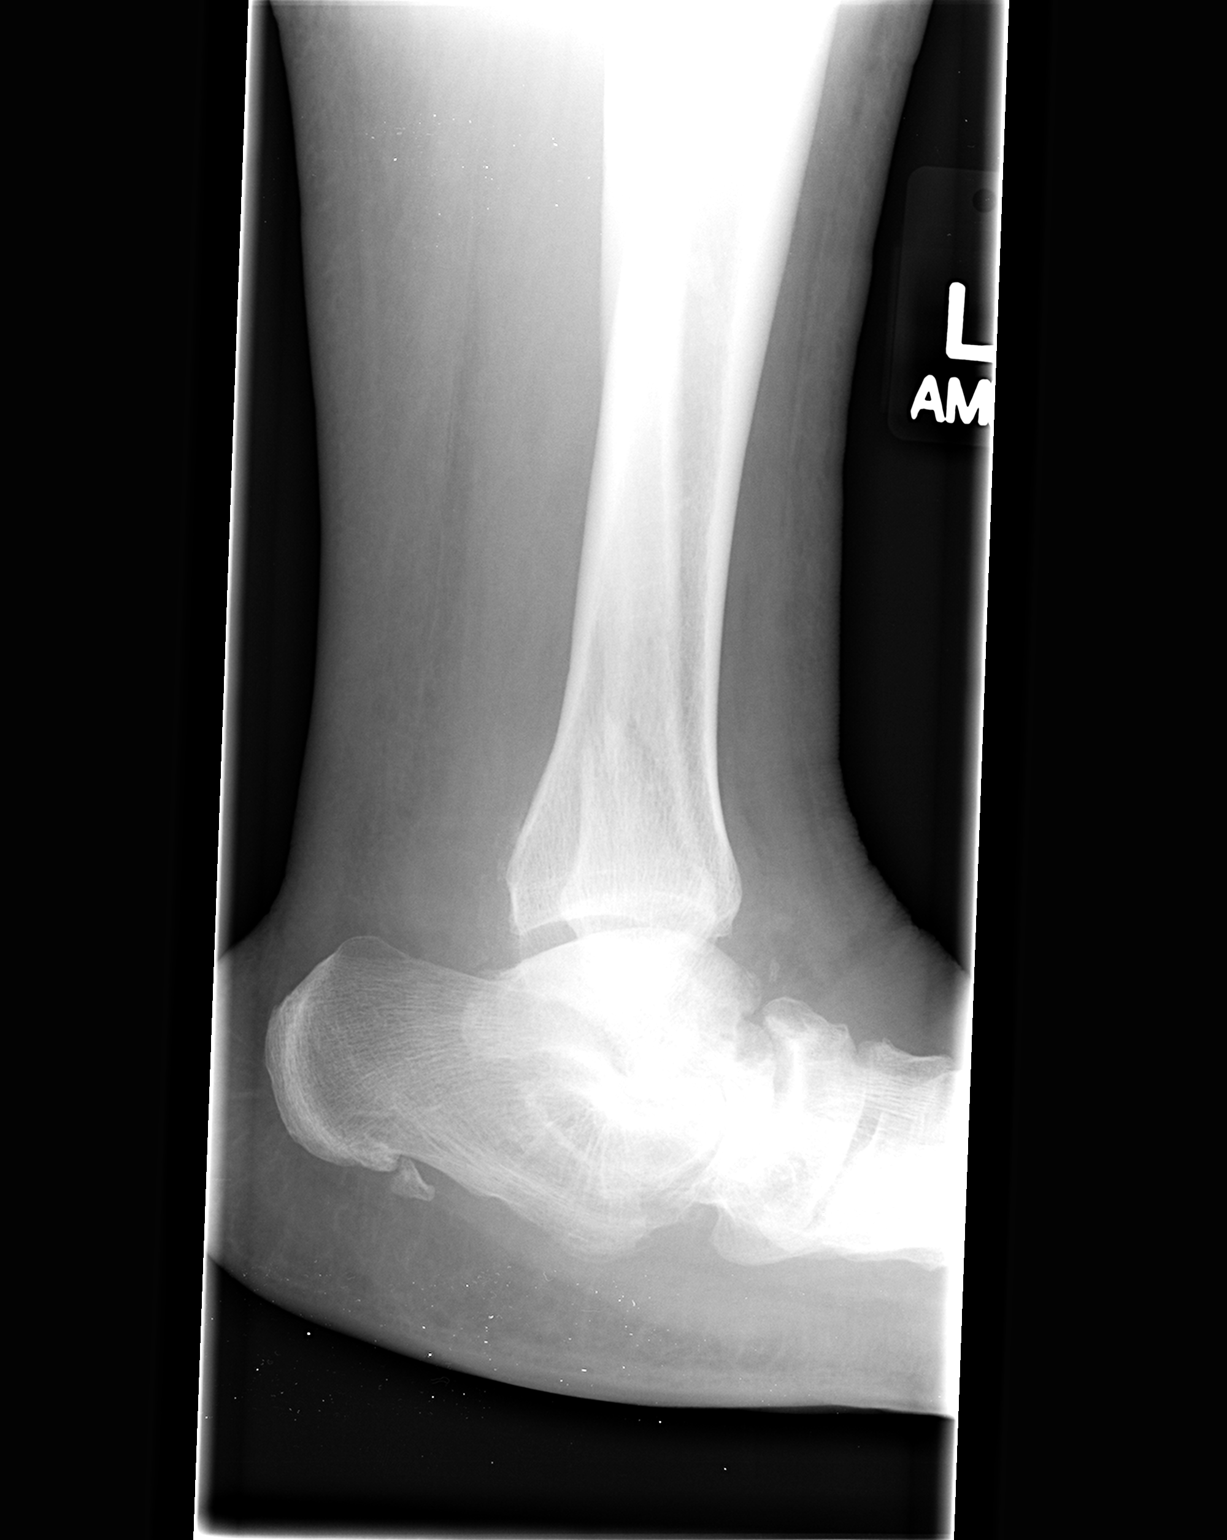

[3 of 3 positions shown; findings below may reference images not displayed]

FINDINGS: There is a minimally-displaced fracture of the distal
fibular metaphysis with overlying soft tissue swelling.  Difficult
to definitively exclude a nondisplaced avulsion fracture of the
medial malleolus.  Diffuse soft tissue swelling in the lower leg
and ankle.  Lateral view of the ankle shows Kakende Baard
deformity of the foot.
IMPRESSION: 1.  Minimally displaced fracture of the distal fibular metaphysis.
2.  Difficult to definitively exclude nondisplaced avulsion
fracture off the medial malleolus.
3.  Diffuse soft tissue swelling.

## 2013-07-01 IMAGING — CR DG TIBIA/FIBULA 2V*L*
2 series · 2 of 2 positions shown · non-contrast
Comparison: 12/09/2011

CLINICAL DATA: Fall, left ankle pain.

LEFT TIBIA AND FIBULA - 2 VIEW

[view not recorded (1 of 2)]
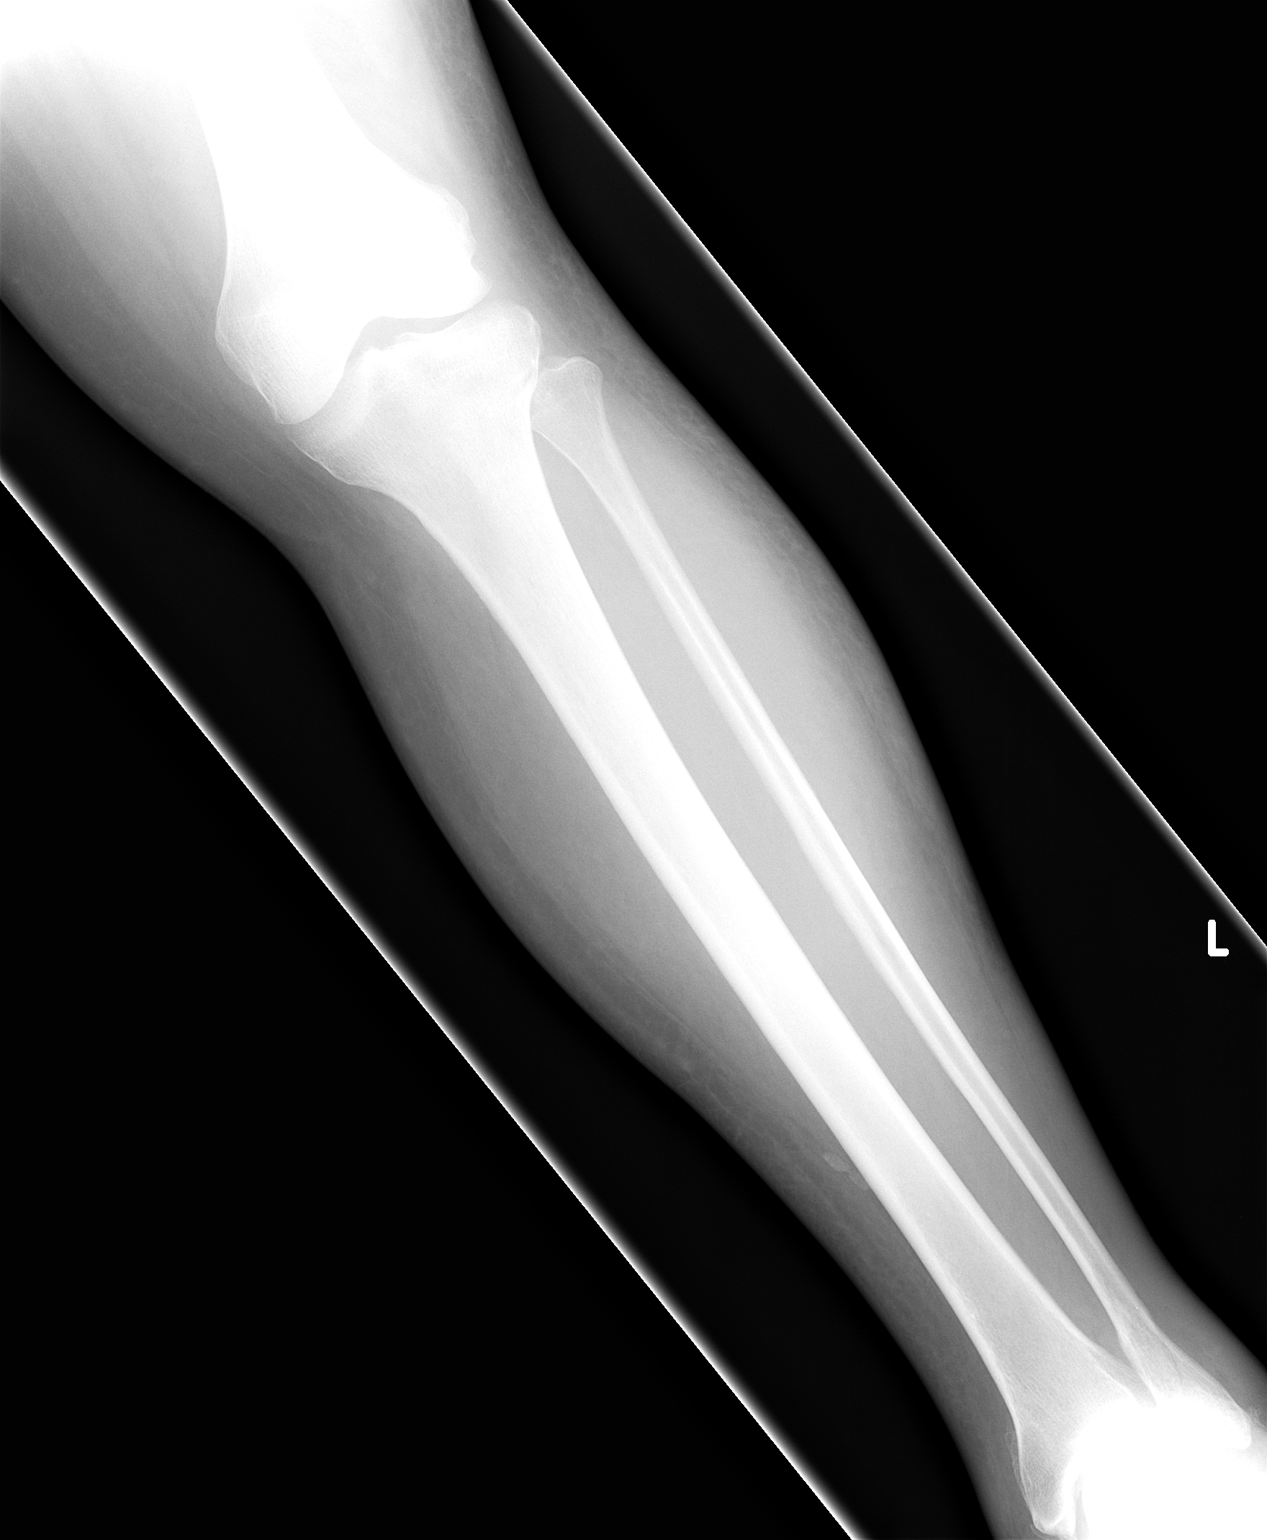

[view not recorded (2 of 2)]
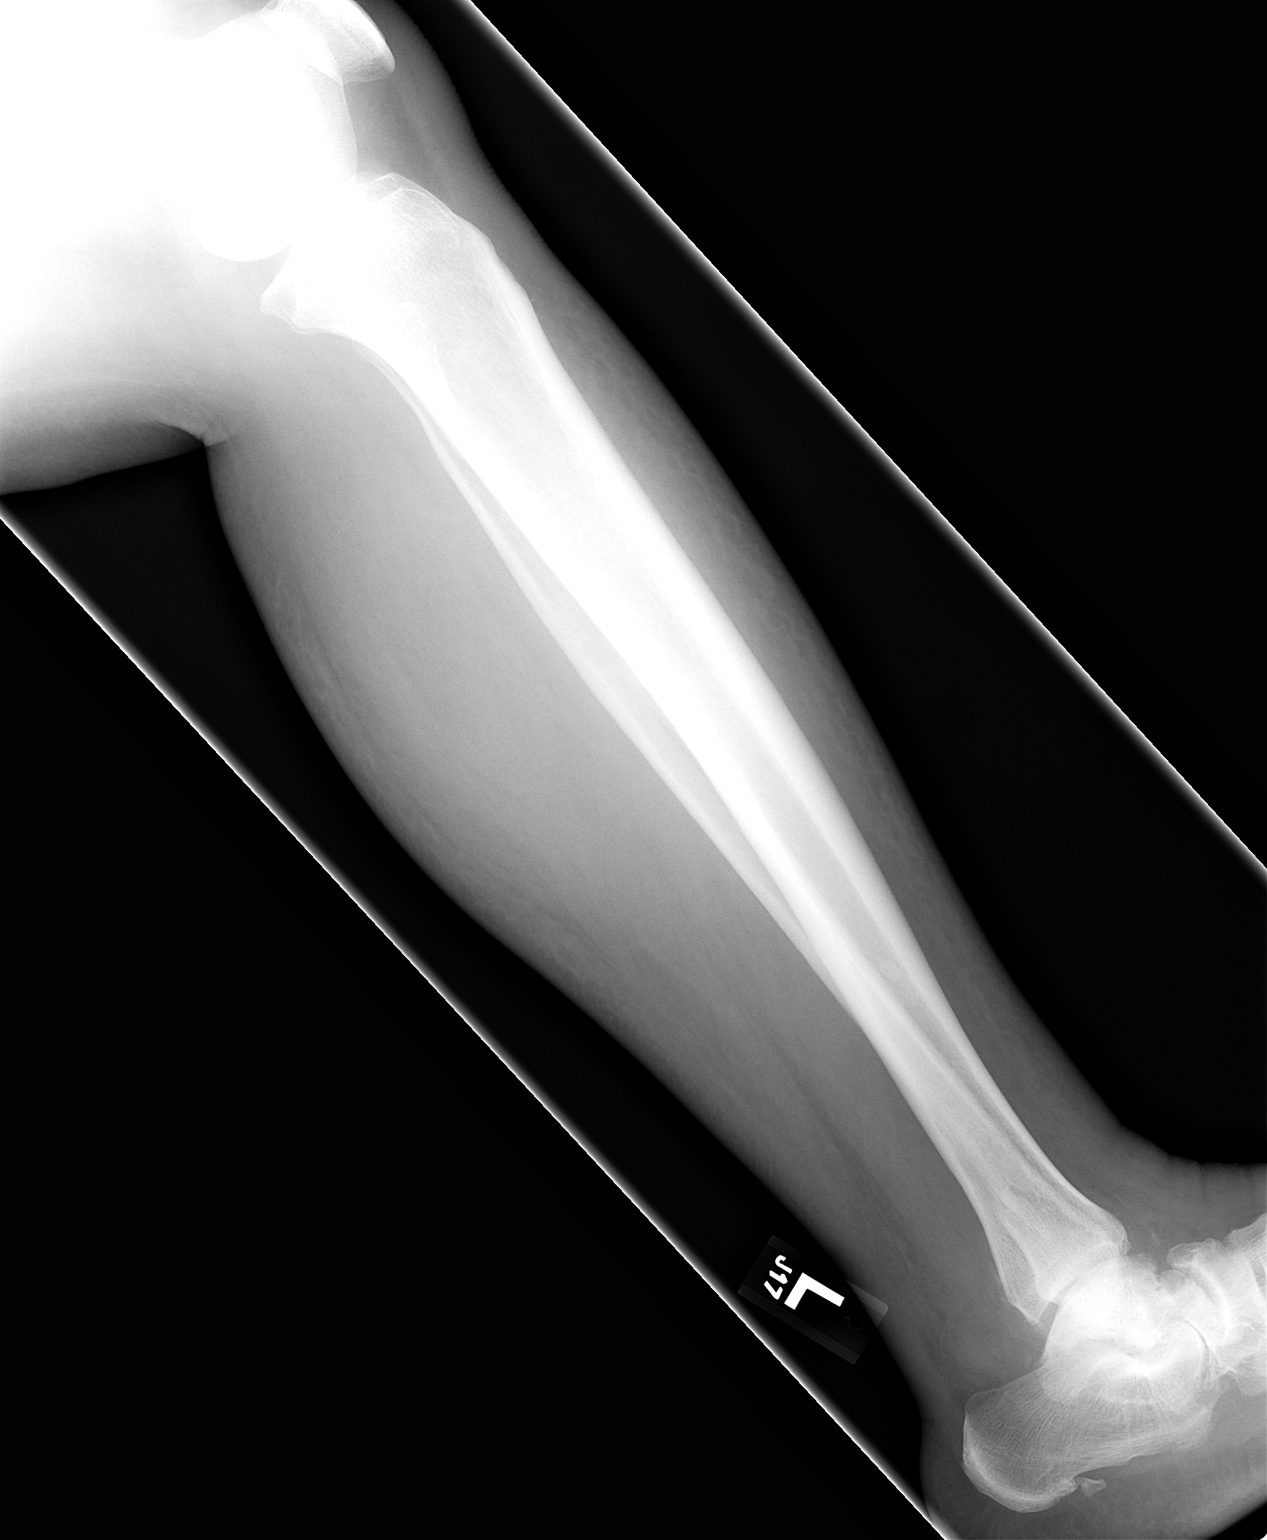

[2 of 2 positions shown; findings below may reference images not displayed]

FINDINGS: There is a minimally-displaced fracture through the
distal left fibula.  This enters the ankle mortise.  No tibial
abnormality is seen.

Severe pes planus deformity of the foot.

Diffuse soft tissue swelling about the ankle.
IMPRESSION: Oblique minimally-displaced fracture through the distal left
fibula.

Severe pes planus deformity of the hind foot.

## 2013-07-08 IMAGING — US US BREAST*L*
1 series · 6 of 6 positions shown · non-contrast
Comparison: 03/26/2011
COMPARISON: 03/26/2011

<!--  IDXRADR:ADDEND:BEGIN -->
Addendum Begins
<!--  IDXRADR:ADDEND:INNER_BEGIN -->
***ADDENDUM*** CREATED: 12/21/2011 [DATE]

Gram stain demonstrates abundant white blood cells present,
predominantly PMN. There are a few gram-positive cocci in pairs.
Culture demonstrates moderate peptostreptococcus species. This is
concordant with the imaging findings.
***END ADDENDUM*** SIGNED BY: Guest Doc Melome, M.D.
CLINICAL DATA: The patient has felt a lump in the left subareolar
region for 2 weeks.  The patient is diabetic.
DIGITAL DIAGNOSTIC LEFT MAMMOGRAM WITH CAD AND LEFT BREAST
ULTRASOUND AND ULTRASOUND GUIDED ASPIRATION OF THE LEFT  BREAST:

[Series 1: us breast*left* · 0.08mm/px · 6 of 6 slices shown]
[im 1/6]
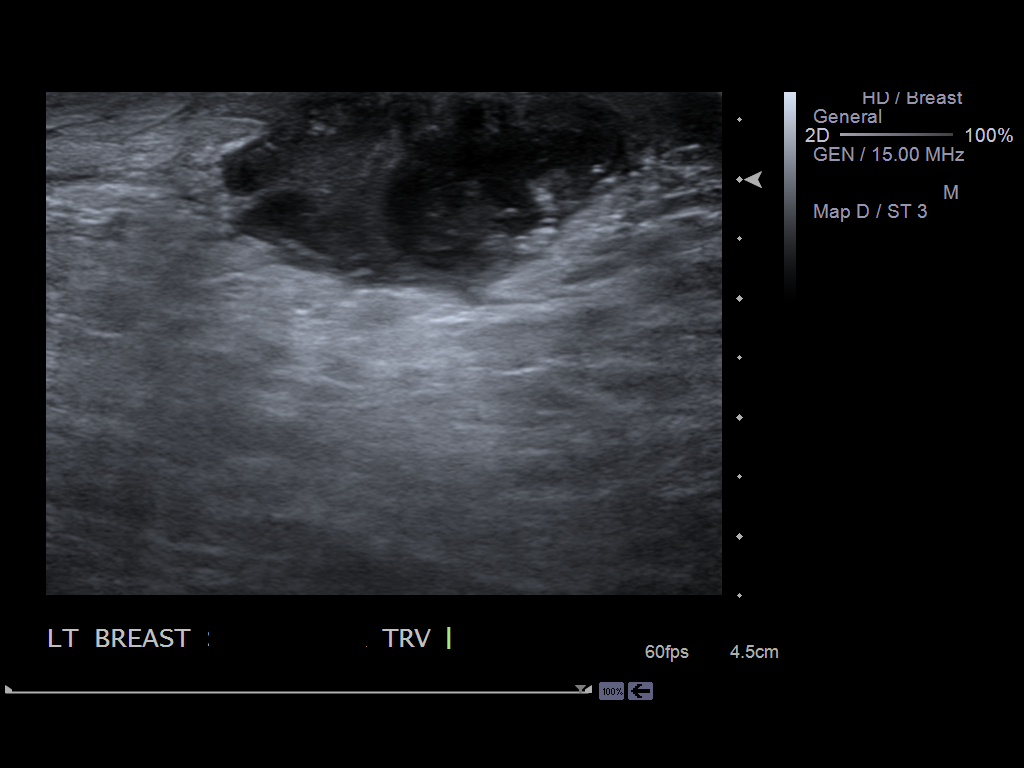
[im 2/6]
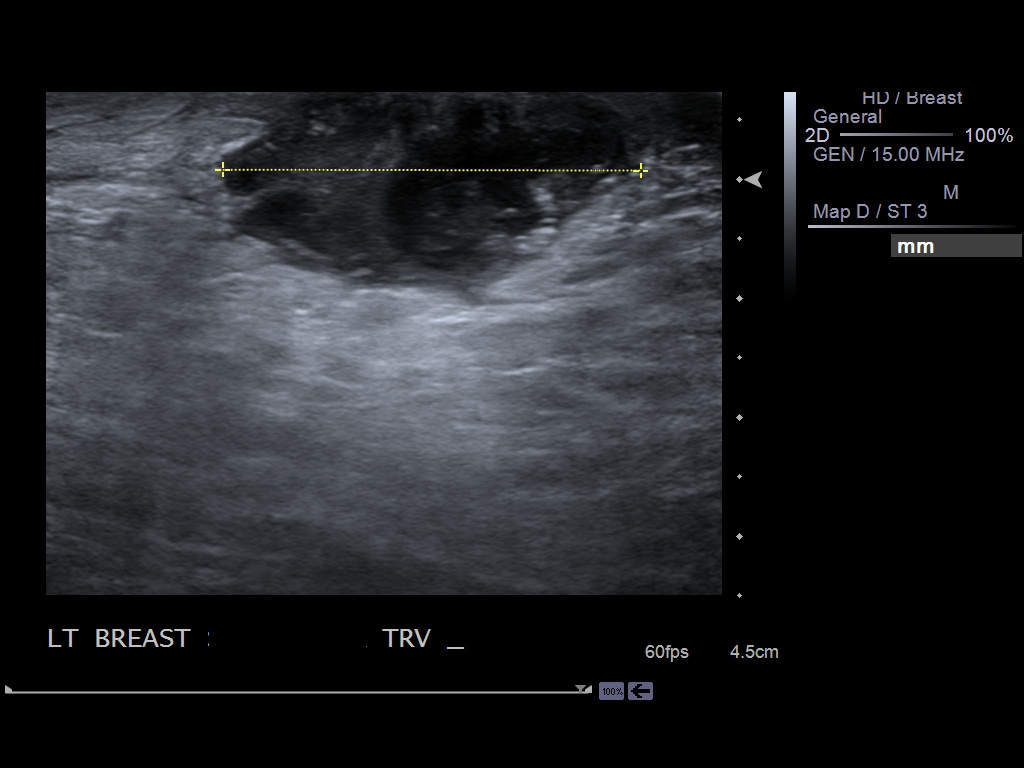
[im 3/6]
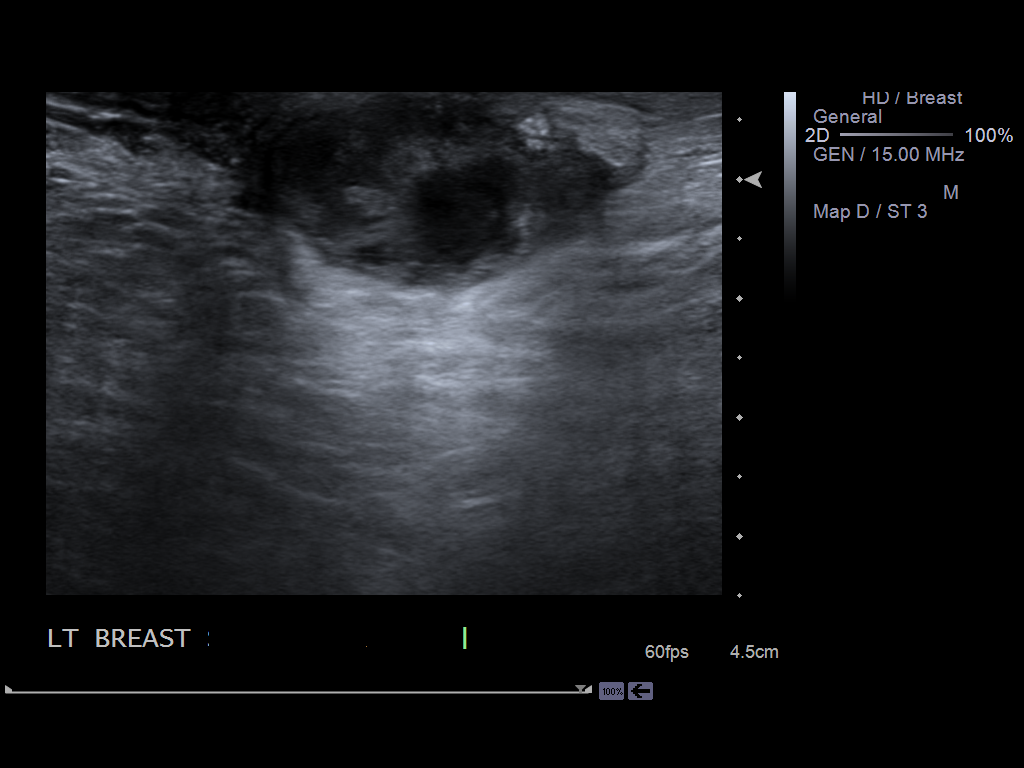
[im 4/6]
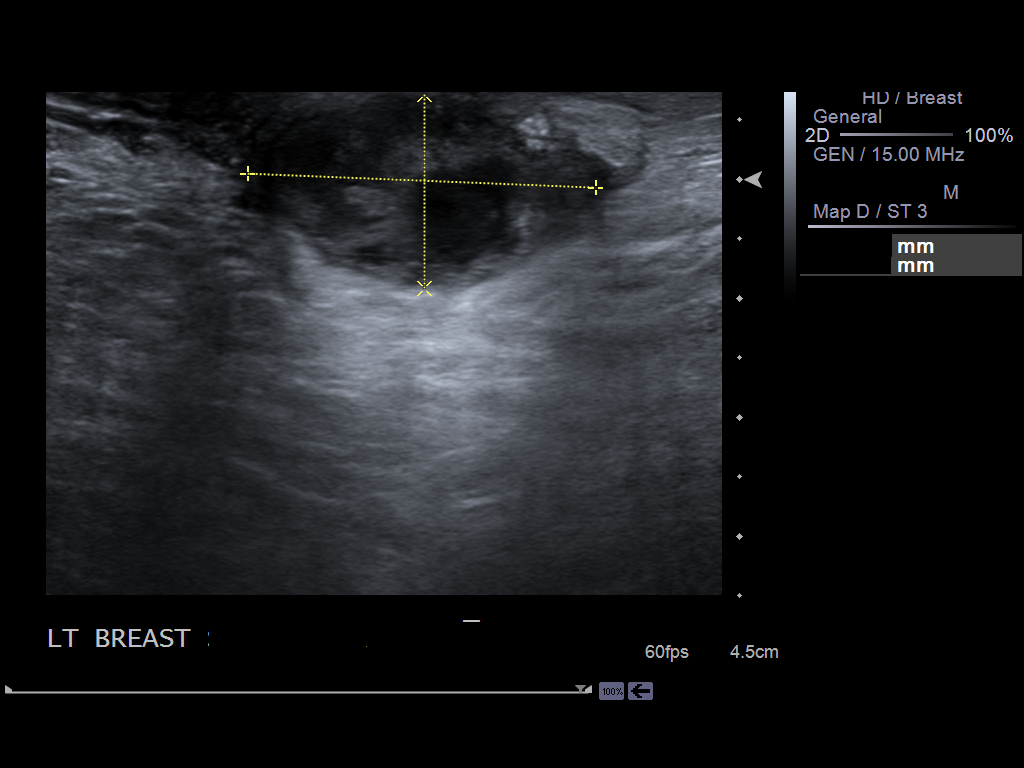
[im 5/6]
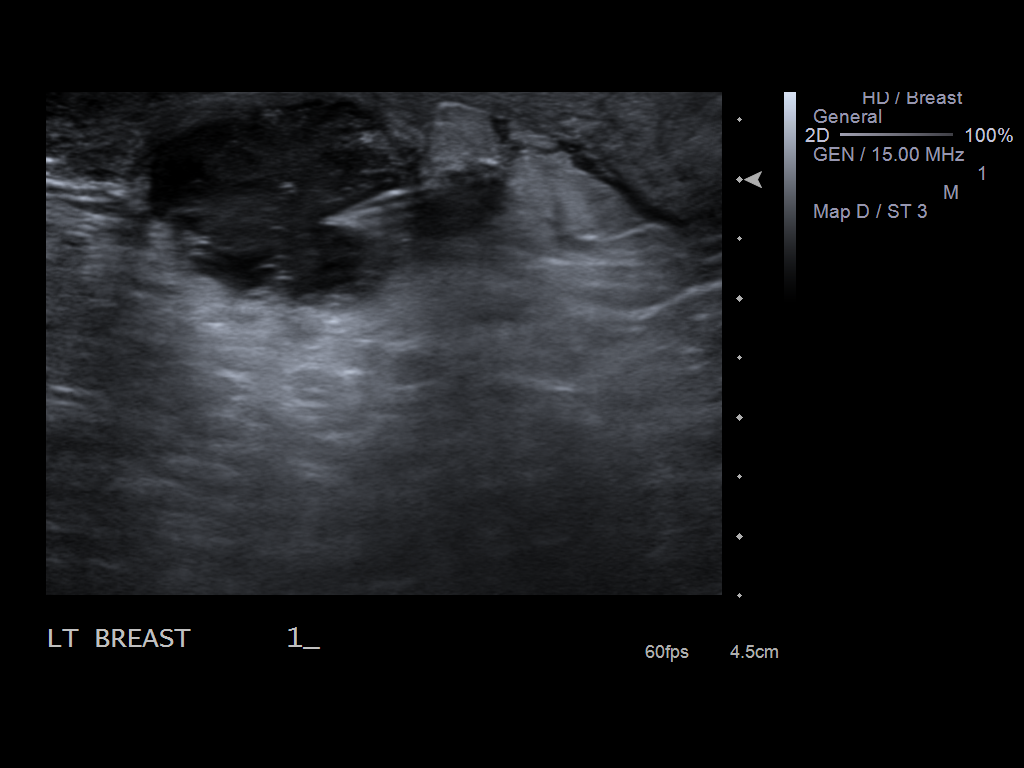
[im 6/6]
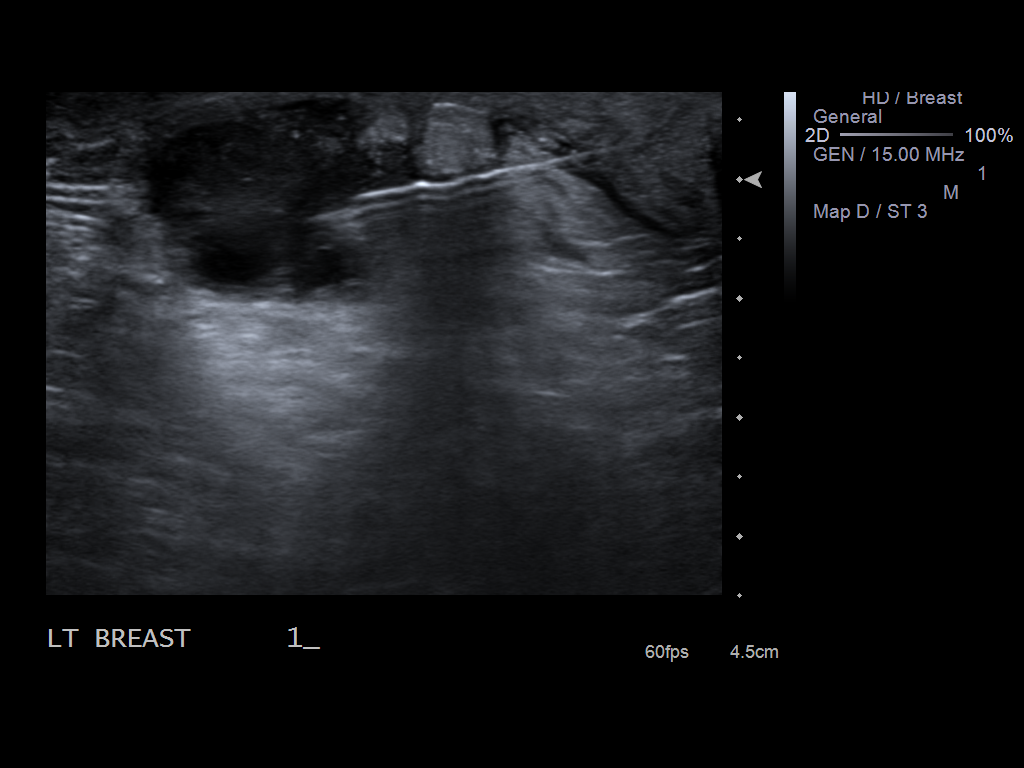

[6 of 6 positions shown; findings below may reference images not displayed]

FINDINGS: The breast tissue is almost entirely fatty.  There is an
oval indistinct mass in the left subareolar region.  There is mild
thickening of the skin of the periareolar region.
Mammographic images were processed with CAD.

On physical exam, there is a firm 3 cm mass in the left subareolar
region.

Ultrasound is performed, showing an oval fluid collection of mixed
echogenicity in the left subareolar region measuring 3.5 x 2.9 x
1.6 cm.  This would be consistent with an abscess.  Aspiration is
proposed to the patient and she agreed with this plan.

Using sterile technique,  1% lidocaine, ultrasound guidance and  an
18 gauge needle...], aspiration was performed of  the left
subareolar fluid collection.  Approximately 4 ml of pus was
withdrawn. The abscess could not be completely aspirated but it
decreased in size after aspiration.] Fluid was sent for Gram stain
and culture and sensitivities.

Report was telephoned to Dainius Carfly at Dr. [REDACTED]; a course
of antibiotics is recommended.  Dr. Knut Ole prescribe
antibiotics for the patient.
IMPRESSION: Left subareolar abscess.  Gram stain and culture/sensitivities
pending.  A course of antibiotics is recommended.  Clinical follow-
up is suggested.  If findings completely resolve, no further
imaging is necessary.  Yearly screening mammography is suggested
with next scheduled exam in February 2012.

BI-RADS CATEGORY 2:  Benign finding(s).

<!--  IDXRADR:ADDEND:INNER_END -->
Addendum Ends
<!--  IDXRADR:ADDEND:END -->
FINDINGS: The breast tissue is almost entirely fatty.  There is an
oval indistinct mass in the left subareolar region.  There is mild
thickening of the skin of the periareolar region.
Mammographic images were processed with CAD.

On physical exam, there is a firm 3 cm mass in the left subareolar
region.

Ultrasound is performed, showing an oval fluid collection of mixed
echogenicity in the left subareolar region measuring 3.5 x 2.9 x
1.6 cm.  This would be consistent with an abscess.  Aspiration is
proposed to the patient and she agreed with this plan.

Using sterile technique,  1% lidocaine, ultrasound guidance and  an
18 gauge needle...], aspiration was performed of  the left
subareolar fluid collection.  Approximately 4 ml of pus was
withdrawn. The abscess could not be completely aspirated but it
decreased in size after aspiration.] Fluid was sent for Gram stain
and culture and sensitivities.

Report was telephoned to Predragsandra Zigic Licina at Dr. [REDACTED]; a course
of antibiotics is recommended.  Dr. Dainius Carfly prescribe
antibiotics for the pa

## 2013-07-11 ENCOUNTER — Telehealth: Payer: Self-pay | Admitting: Family Medicine
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
## 2013-07-18 NOTE — Telephone Encounter (Signed)
Called patient left message. Form has been faxed to [facility]

## 2013-07-21 ENCOUNTER — Encounter: Payer: Self-pay | Admitting: *Deleted

## 2013-07-25 ENCOUNTER — Other Ambulatory Visit: Payer: Self-pay | Admitting: Family Medicine

## 2013-08-14 ENCOUNTER — Other Ambulatory Visit: Payer: Self-pay | Admitting: Family Medicine

## 2013-08-28 ENCOUNTER — Encounter (INDEPENDENT_AMBULATORY_CARE_PROVIDER_SITE_OTHER): Payer: Self-pay

## 2013-08-28 ENCOUNTER — Encounter: Payer: Self-pay | Admitting: Family Medicine

## 2013-08-28 ENCOUNTER — Ambulatory Visit (INDEPENDENT_AMBULATORY_CARE_PROVIDER_SITE_OTHER): Payer: PRIVATE HEALTH INSURANCE | Admitting: Family Medicine

## 2013-08-28 VITALS — BP 206/140 | HR 100 | Resp 18 | Ht 62.0 in | Wt 192.0 lb

## 2013-08-28 DIAGNOSIS — E1065 Type 1 diabetes mellitus with hyperglycemia: Secondary | ICD-10-CM

## 2013-08-28 DIAGNOSIS — F17200 Nicotine dependence, unspecified, uncomplicated: Secondary | ICD-10-CM

## 2013-08-28 DIAGNOSIS — Z9119 Patient's noncompliance with other medical treatment and regimen: Secondary | ICD-10-CM

## 2013-08-28 DIAGNOSIS — F329 Major depressive disorder, single episode, unspecified: Secondary | ICD-10-CM

## 2013-08-28 DIAGNOSIS — F411 Generalized anxiety disorder: Secondary | ICD-10-CM

## 2013-08-28 DIAGNOSIS — I10 Essential (primary) hypertension: Secondary | ICD-10-CM

## 2013-08-28 DIAGNOSIS — E669 Obesity, unspecified: Secondary | ICD-10-CM

## 2013-08-28 DIAGNOSIS — E785 Hyperlipidemia, unspecified: Secondary | ICD-10-CM

## 2013-08-28 DIAGNOSIS — Z23 Encounter for immunization: Secondary | ICD-10-CM

## 2013-08-28 DIAGNOSIS — IMO0001 Reserved for inherently not codable concepts without codable children: Secondary | ICD-10-CM

## 2013-08-28 MED ORDER — CLONIDINE HCL 0.3 MG PO TABS: 0.3000 mg | ORAL_TABLET | Freq: Three times a day (TID) | ORAL | Status: DC

## 2013-08-28 MED ORDER — CLONIDINE HCL 0.3 MG/24HR TD PTWK: 1.0000 | MEDICATED_PATCH | TRANSDERMAL | Status: DC

## 2013-08-28 NOTE — Progress Notes (Signed)
  Subjective:    Patient ID: [name], female    DOB: 1965/12/22, 47 y.o.   MRN: [id number]  HPI The PT is here for follow up and re-evaluation of chronic medical conditions, medication management and review of any available recent lab and radiology data.  Preventive health is updated, specifically  Cancer screening and Immunization.   Pt remains absolutely non compliant as far as health care is concerned. She does not fill most medication prescribed, routinely fills xanax, and if on pain management , she will also fill pain medication. (I do not prescribe pain medication for her) She is till in the same unhealthy living situation reportedly, states that today she is on her way to seek alternate housing after the visit Not taking medication for her severe HTN, not testing sugars  Or following  With endocrinology. Smoking with no quit date in mind. Denies alcohol or illicit drug use. Denies active suicidal or homicidal ideation , but agrees again that her behavior is self destructive, states she wants to change this     Review of Systems See HPI Denies recent fever or chills. Denies sinus pressure, nasal congestion, ear pain or sore throat. Denies chest congestion, productive cough or wheezing. Denies chest pains, palpitations and leg swelling Denies abdominal pain, nausea, vomiting,diarrhea or constipation.   Denies dysuria, frequency, hesitancy or incontinence. Chronic  joint pain,  and limitation in mobility. Denies headaches, seizures, numbness, or tingling.  Denies skin break down or rash.        Objective:   Physical Exam Patient alert and oriented and in no cardiopulmonary distress.  HEENT: No facial asymmetry, EOMI, no sinus tenderness,  oropharynx pink and moist.  Neck supple no adenopathy.  Chest: Clear to auscultation bilaterally.Decreased air entry throughout  CVS: S1, S2 no murmurs, no S3.  ABD: Soft non tender.  Ext: No edema  MS: Adequate ROM spine,  shoulders, hips and knees.  Skin: Intact, no ulcerations or rash noted.  Psych: Good eye contact, normal affect. Memory intact not anxious or depressed appearing.  CNS: CN 2-12 intact, power,  normal throughout.        Assessment & Plan:

## 2013-08-28 NOTE — Patient Instructions (Addendum)
F/u in  2 month, call if you need me before  Please make and keep appointment for eye exam and Dr. Fransico Him and mental health, I will enter referral to Dr Fransico Him  Flu vaccine  Today  Blood pressure medication ,  will be sent in, the recliner lift chair and comode elevator  Fasting lipid, cmp and EGFR and HBA1C and vit D level this week

## 2013-08-29 ENCOUNTER — Telehealth: Payer: Self-pay | Admitting: Family Medicine

## 2013-08-29 NOTE — Telephone Encounter (Signed)
Patient is asking for a commode extender and a lift recliner to be sent to [facility]

## 2013-08-30 NOTE — Telephone Encounter (Signed)
Noted and faxed.  

## 2013-08-30 NOTE — Telephone Encounter (Signed)
written

## 2013-09-03 NOTE — Assessment & Plan Note (Signed)
Consistently  taking xanax, non compliant with need to establish with psychiatry, and never filled script for fluoxetine which was prescribed. At next visit will address med non compliance issus, , change to buspar with at most 1 xanax, hopefully  pt will also accept the need for therapy and management by psychiatry. Though not actively suicidal, her behavior is self destructive, and she is aware
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
## 2013-09-03 NOTE — Assessment & Plan Note (Signed)
Remains non compliant. Has not established with psychiatry, nor has she started therapy. Denies suicidal or homicidal ideation. Non compliant with medication prescribed. Fluoxetine prescribed and never filled

## 2013-09-03 NOTE — Assessment & Plan Note (Signed)
Deteriorated. Patient re-educated about  the importance of commitment to a  minimum of 150 minutes of exercise per week. The importance of healthy food choices with portion control discussed. Encouraged to start a food diary, count calories and to consider  joining a support group. Sample diet sheets offered. Goals set by the patient for the next several months.    

## 2013-09-03 NOTE — Assessment & Plan Note (Signed)
Unchanged. Patient counseled for approximately 5 minutes regarding the health risks of ongoing nicotine use, specifically all types of cancer, heart disease, stroke and respiratory failure. The options available for help with cessation ,the behavioral changes to assist the process, and the option to either gradully reduce usage  Or abruptly stop.is also discussed. Pt is also encouraged to set specific goals in number of cigarettes used daily, as well as to set a quit date.  

## 2013-09-03 NOTE — Assessment & Plan Note (Signed)
Ongoing non compliance with medication , eating plan and testing Updated labs and re establishment with endocrinology

## 2013-09-03 NOTE — Assessment & Plan Note (Signed)
Uncontrolled, medically non compliant Will see if [facility] can be of any assistance

## 2013-09-03 NOTE — Assessment & Plan Note (Signed)
Uncontrolled, needs updated lab Hyperlipidemia:Low fat diet discussed and encouraged.

## 2013-09-04 ENCOUNTER — Other Ambulatory Visit: Payer: Self-pay | Admitting: Family Medicine

## 2013-09-04 ENCOUNTER — Telehealth: Payer: Self-pay | Admitting: Family Medicine

## 2013-09-04 DIAGNOSIS — IMO0002 Reserved for concepts with insufficient information to code with codable children: Secondary | ICD-10-CM

## 2013-09-04 DIAGNOSIS — F32A Depression, unspecified: Secondary | ICD-10-CM

## 2013-09-04 DIAGNOSIS — F329 Major depressive disorder, single episode, unspecified: Secondary | ICD-10-CM

## 2013-09-04 DIAGNOSIS — F411 Generalized anxiety disorder: Secondary | ICD-10-CM

## 2013-09-04 NOTE — Telephone Encounter (Signed)
Please refer to psychology and psychiatry for anxiety , depression, I will enter

## 2013-09-05 NOTE — Telephone Encounter (Signed)
Faxed over papers next to door to [facility]

## 2013-09-12 ENCOUNTER — Other Ambulatory Visit: Payer: Self-pay | Admitting: Family Medicine

## 2013-09-13 ENCOUNTER — Telehealth: Payer: Self-pay

## 2013-09-13 NOTE — Telephone Encounter (Signed)
She is to be going to psych. She never filled the fluoxetine which is for anxiety, so I will not be refilling the xanax, pls explain this , no refills

## 2013-09-13 NOTE — Telephone Encounter (Signed)
Is it ok to refill this?  

## 2013-09-14 ENCOUNTER — Other Ambulatory Visit: Payer: Self-pay | Admitting: Family Medicine

## 2013-09-14 ENCOUNTER — Other Ambulatory Visit: Payer: Self-pay

## 2013-09-14 ENCOUNTER — Telehealth: Payer: Self-pay | Admitting: Family Medicine

## 2013-09-14 MED ORDER — PAROXETINE HCL 10 MG PO TABS: 10.0000 mg | ORAL_TABLET | Freq: Every day | ORAL | Status: DC

## 2013-09-14 MED ORDER — FLUOXETINE HCL 10 MG PO CAPS: 10.0000 mg | ORAL_CAPSULE | Freq: Every day | ORAL | Status: DC

## 2013-09-14 MED ORDER — ALPRAZOLAM 0.5 MG PO TABS: ORAL_TABLET | ORAL | Status: DC

## 2013-09-14 NOTE — Telephone Encounter (Signed)
I have entered tapered dose of xanax over the next 2 weeks , then no more, I have entered paxil 10mg  daily to be started for anxiety , pls send if she states she will take it. Pls remind her that her mental health care is best provided by psychiatry which which we have discussed repeatedly in the past during visits

## 2013-09-14 NOTE — Telephone Encounter (Signed)
Spoke with patient and she is aware of tapered dose of Xanax.  Also gave her number for Behavioral Health so that she can followup on appointment.  She states that she is now taking all her meds and is currently on Prozac 10mg .  Do you still want to do paxil or cancel this?    Verified with pharmacy that patient did collect prozac on 11/10

## 2013-09-14 NOTE — Telephone Encounter (Signed)
pls cancel the paxil, and put the prozac bak on her list

## 2013-09-14 NOTE — Telephone Encounter (Signed)
Patient aware and med changed in record.

## 2013-09-14 NOTE — Telephone Encounter (Signed)
Noted  

## 2013-09-18 ENCOUNTER — Other Ambulatory Visit: Payer: Self-pay | Admitting: Family Medicine

## 2013-09-28 ENCOUNTER — Ambulatory Visit (HOSPITAL_COMMUNITY): Payer: Self-pay | Admitting: Psychiatry

## 2013-10-04 ENCOUNTER — Other Ambulatory Visit: Payer: Self-pay | Admitting: Family Medicine

## 2013-10-06 IMAGING — CR DG TIBIA/FIBULA 2V*L*
2 series · 2 of 2 positions shown · non-contrast
Comparison: Ankle radiographs 12/20/2011 and 02/16/2012.

CLINICAL DATA: Fall today.  Anterior leg bruising.

LEFT TIBIA AND FIBULA - 2 VIEW

[view not recorded (1 of 2)]
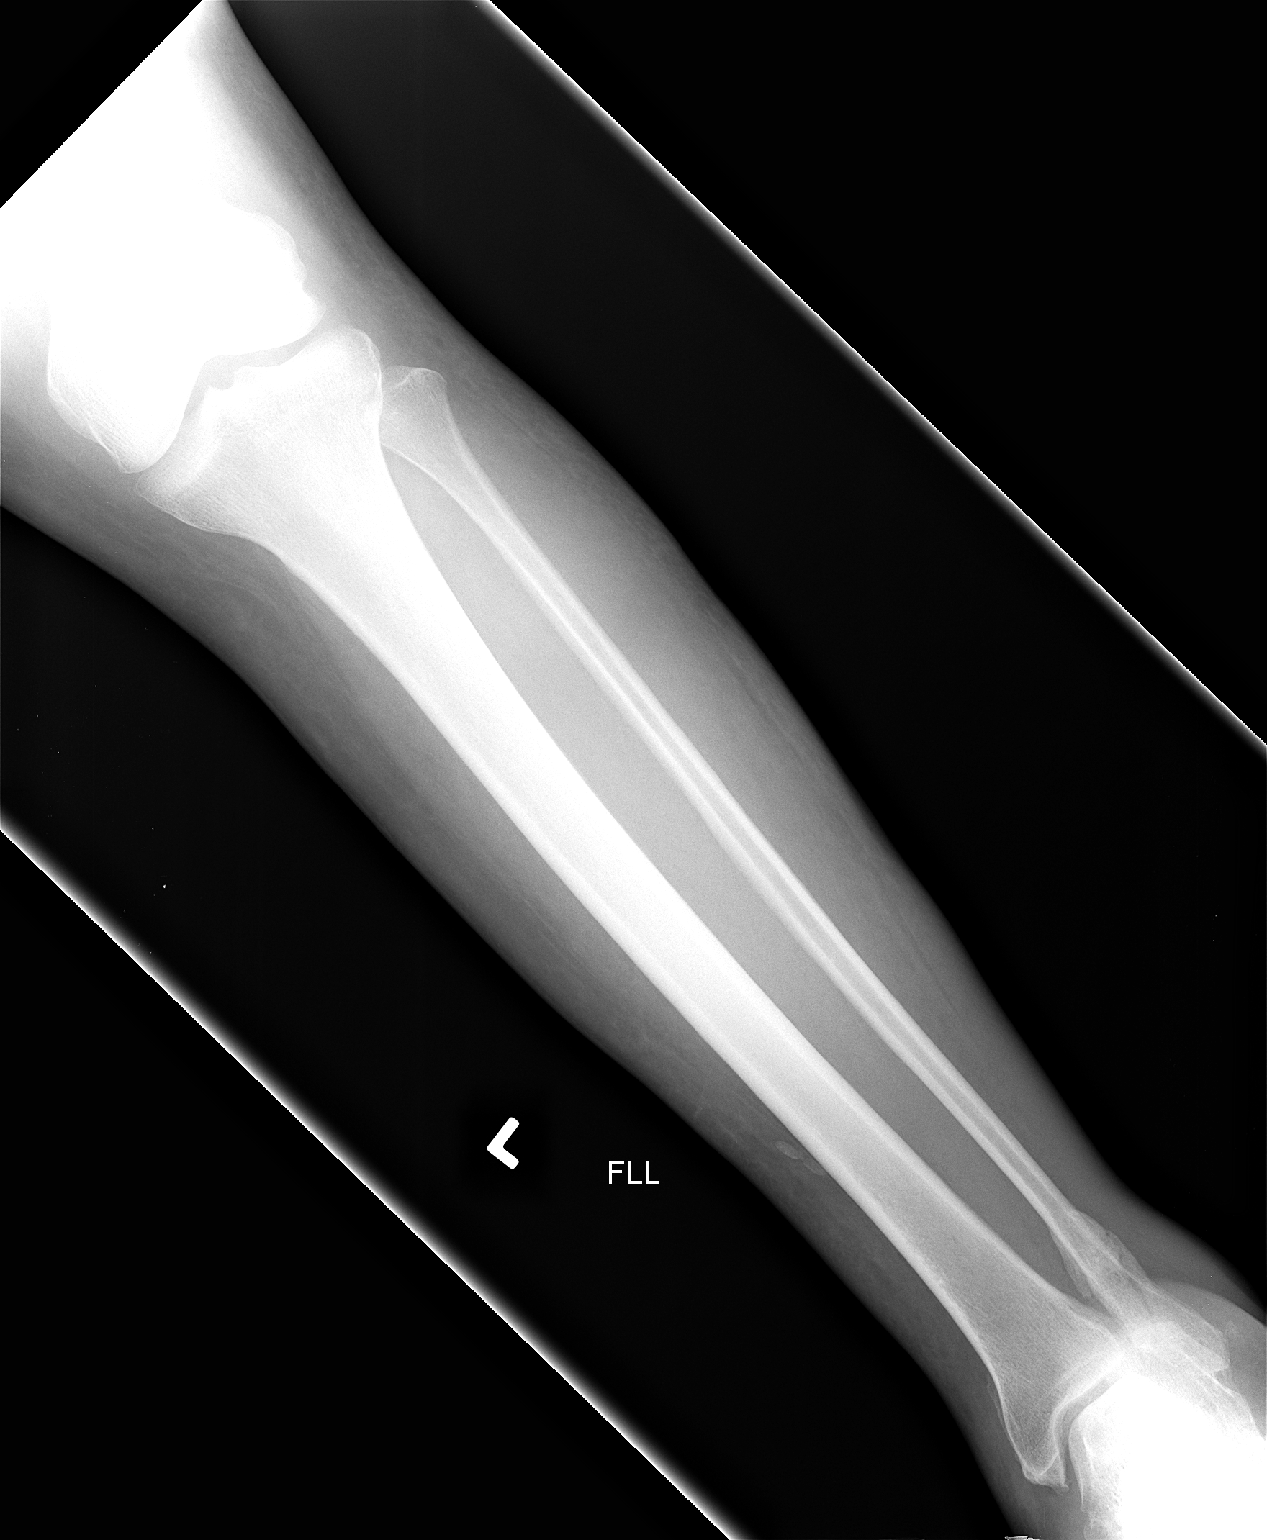

[view not recorded (2 of 2)]
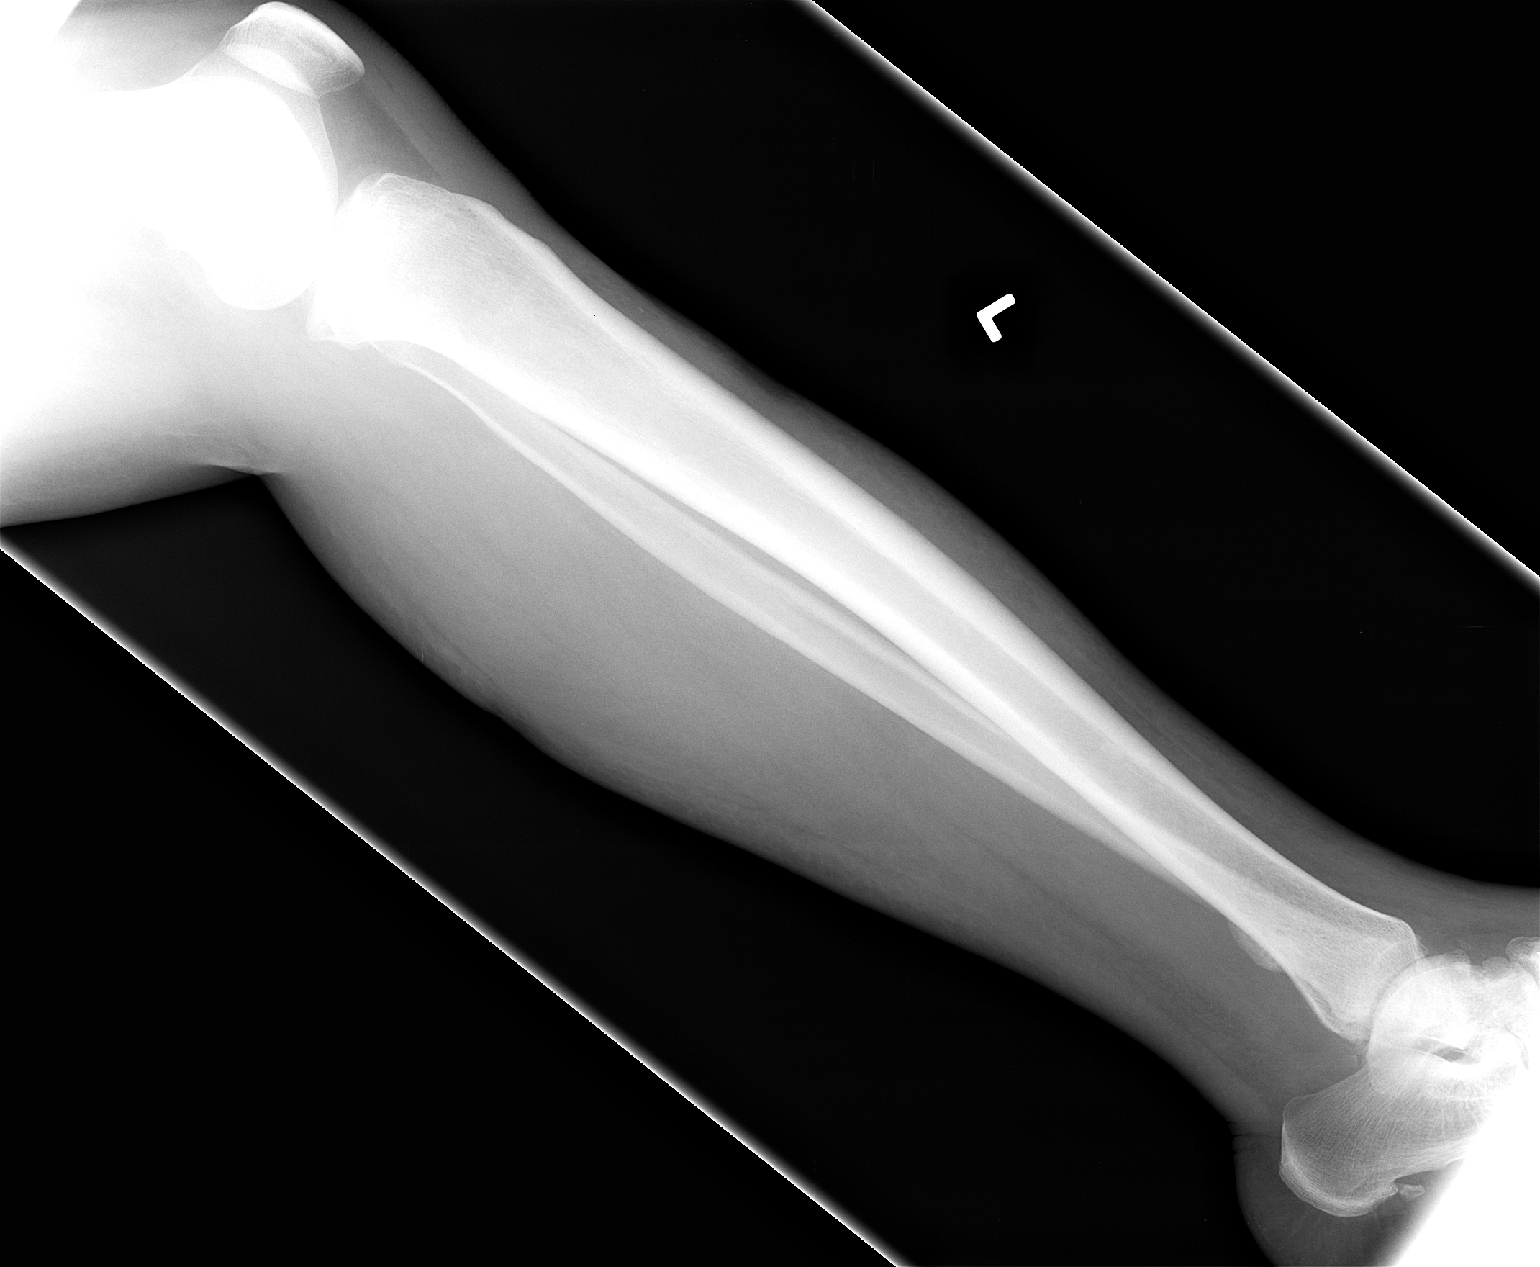

[2 of 2 positions shown; findings below may reference images not displayed]

FINDINGS: There is soft tissue swelling anterior to the mid tibia.
There is post-traumatic deformity of the distal fibula consistent
with a healing fracture.  No acute fracture or dislocation is seen.
Talar neck deformity appears stable.
IMPRESSION: Pretibial soft tissue swelling with healing fracture of the distal
fibula.  No acute osseous findings.

## 2013-10-06 IMAGING — CR DG HAND COMPLETE 3+V*L*
3 series · 3 of 3 positions shown · non-contrast
Comparison: None.

CLINICAL DATA: Fall today.  Hand pain and swelling.

LEFT HAND - COMPLETE 3+ VIEW

[view not recorded (1 of 3)]
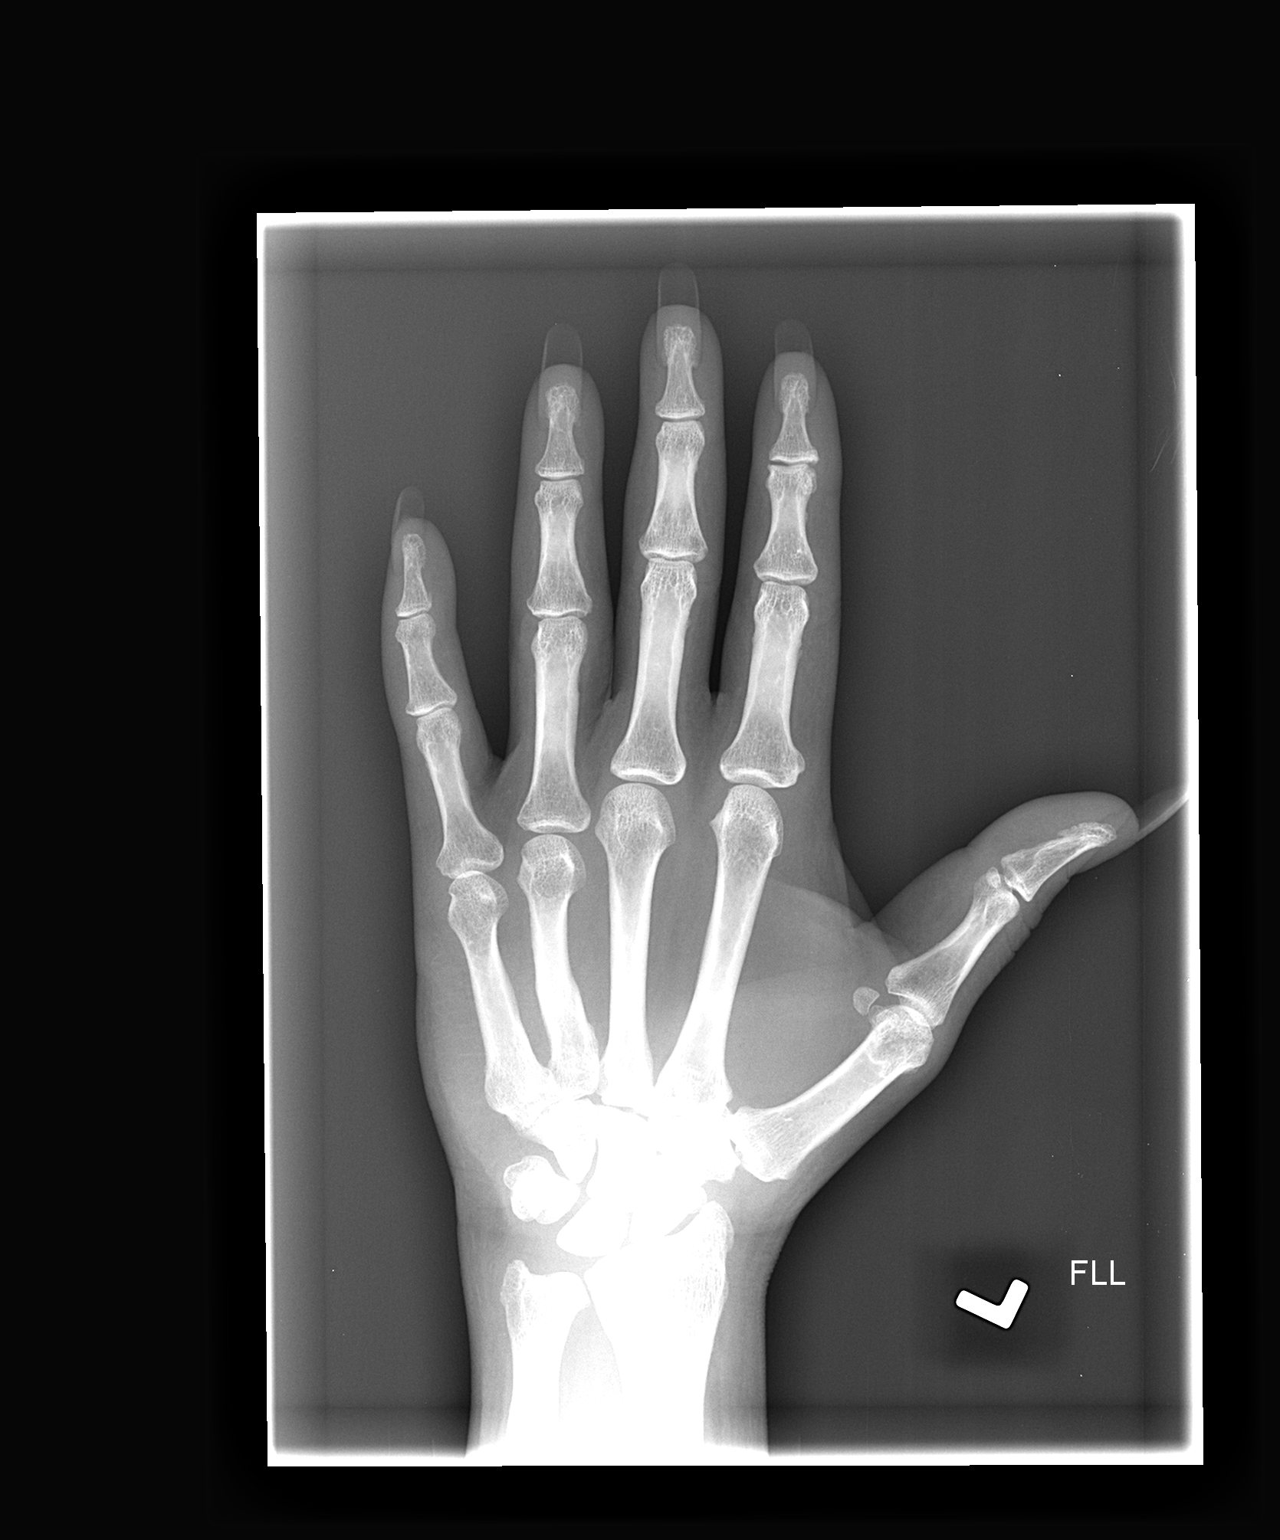

[view not recorded (2 of 3)]
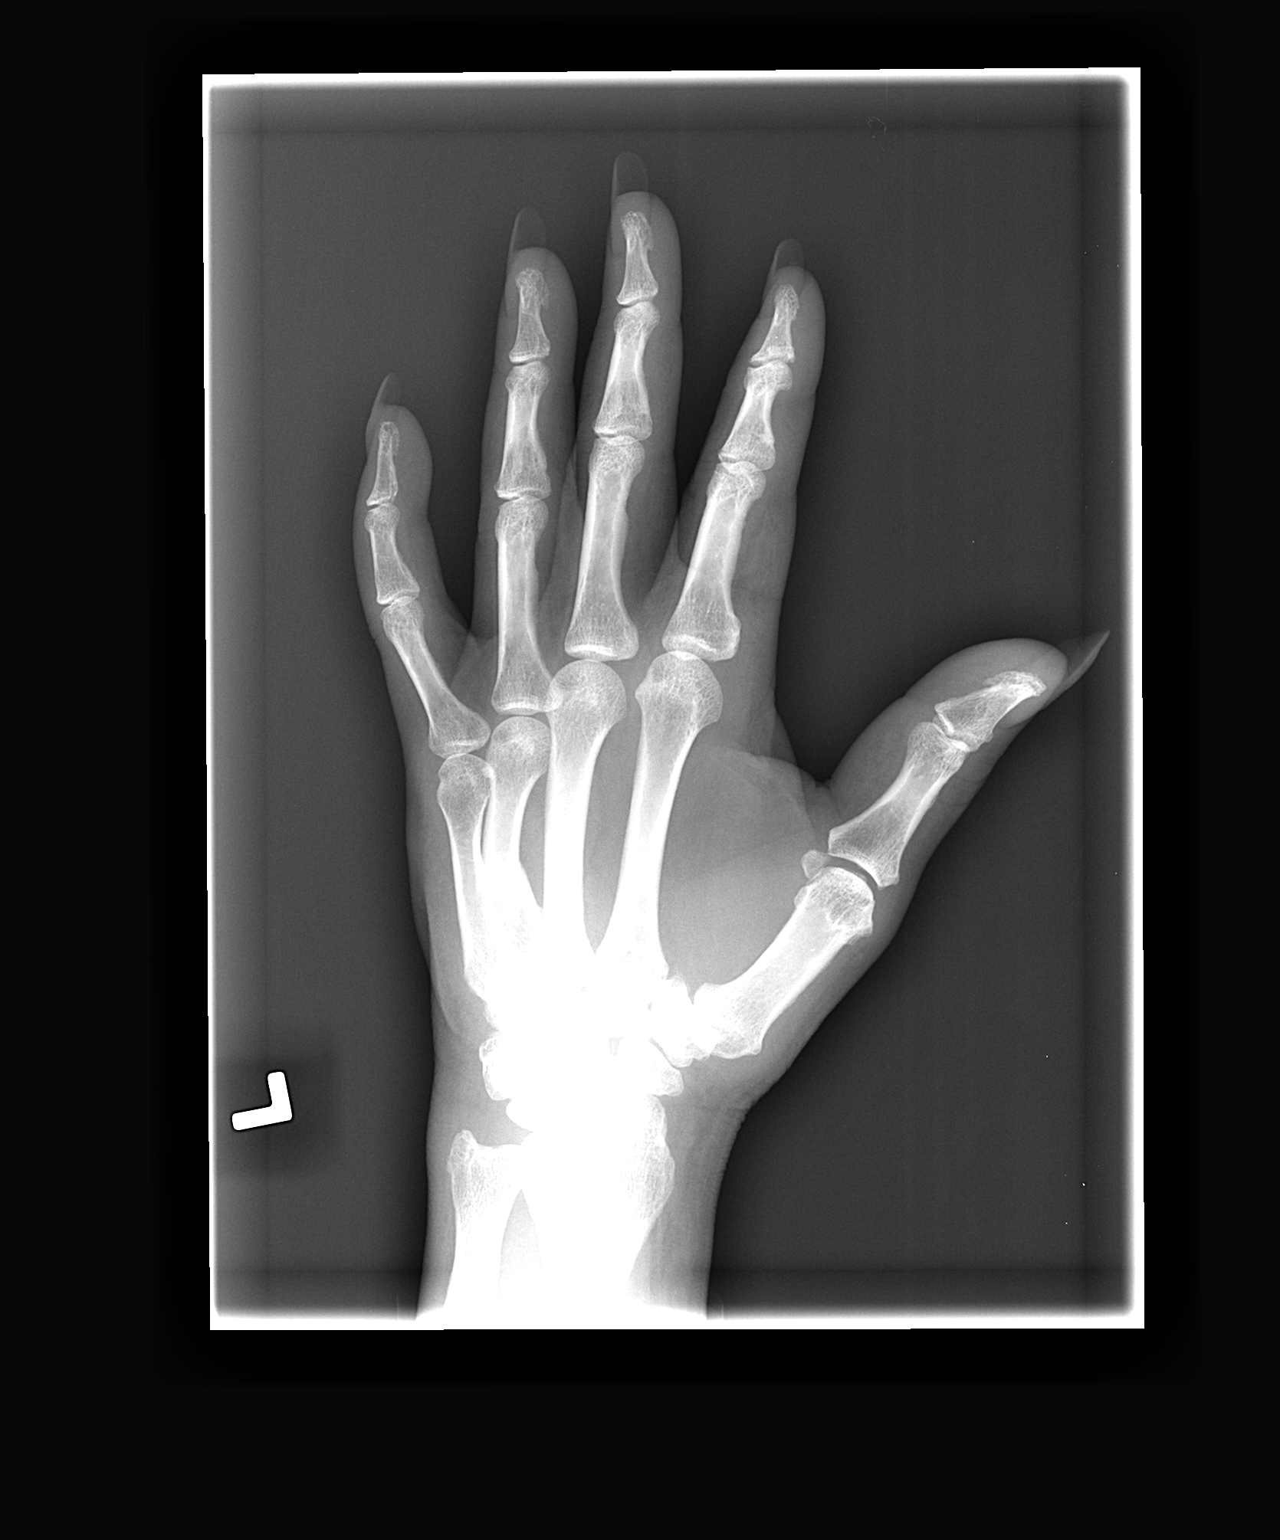

[view not recorded (3 of 3)]
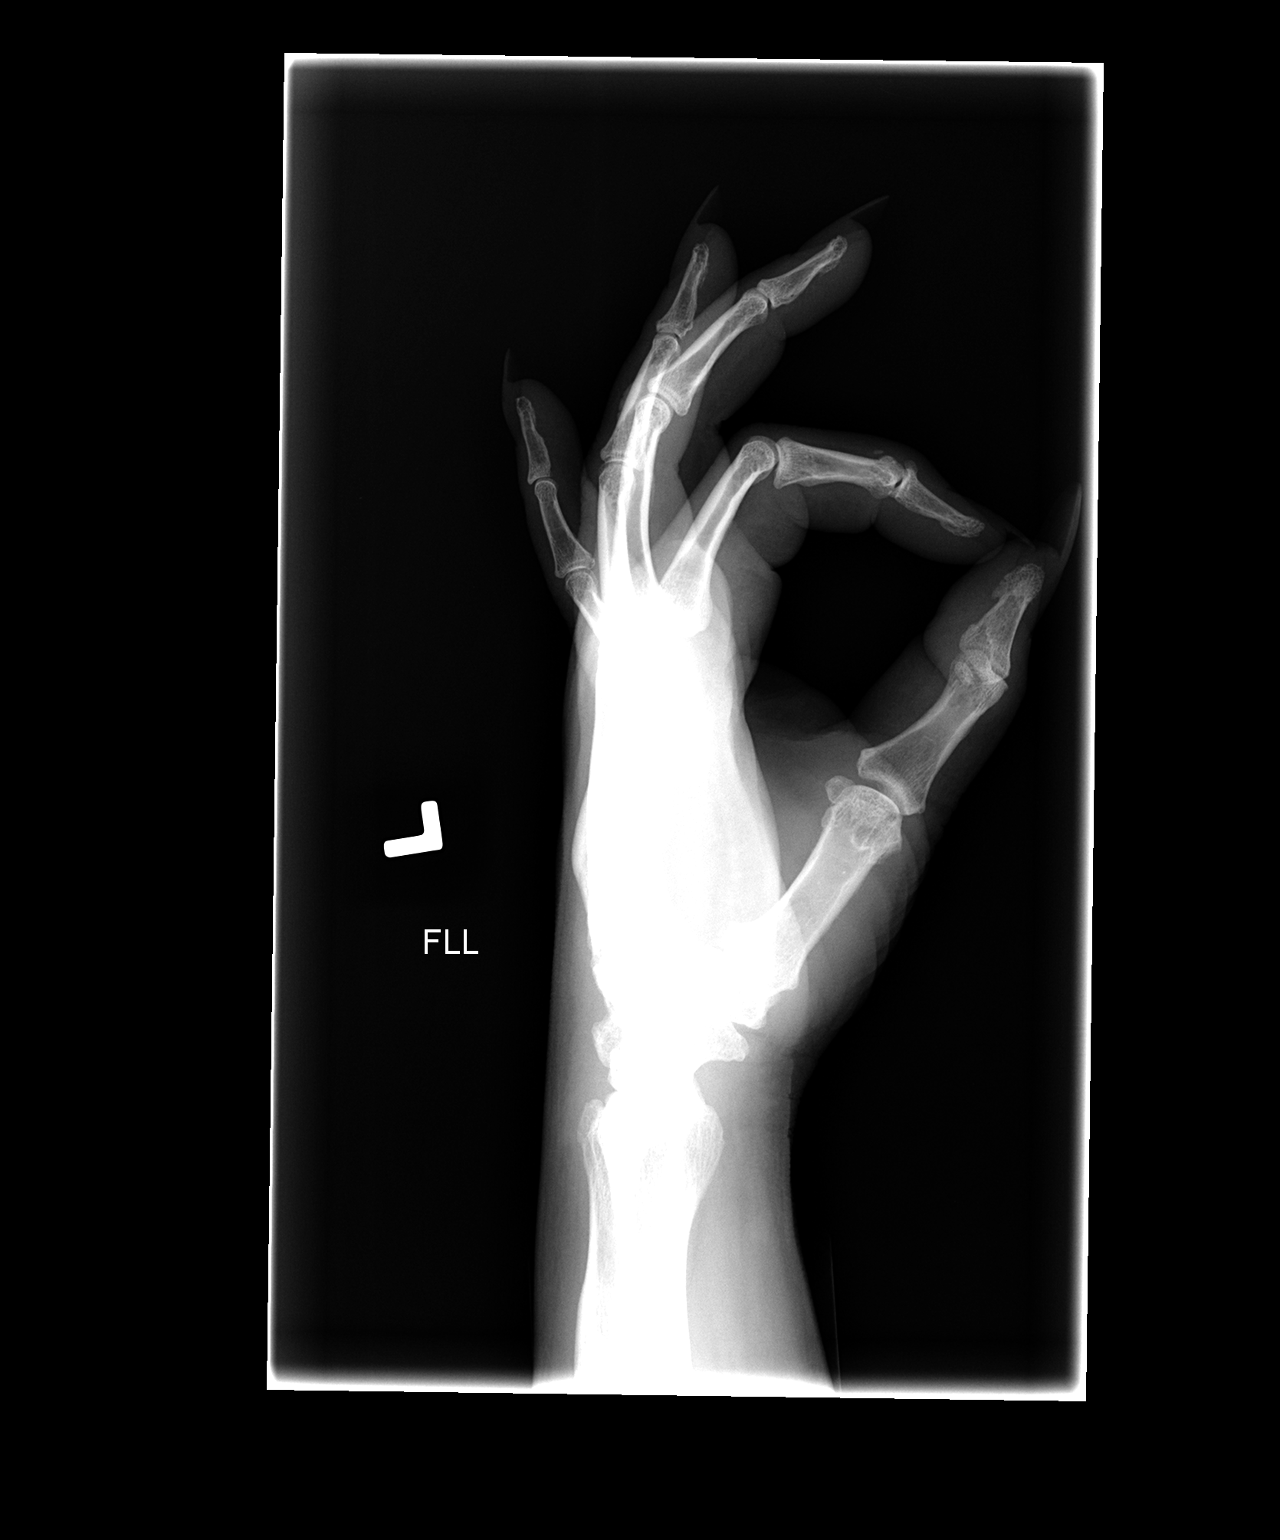

[3 of 3 positions shown; findings below may reference images not displayed]

FINDINGS: There is post-traumatic deformity of the fourth
metacarpal and distal ulna.  No acute fracture or dislocation is
identified.  Mild degenerative changes are present at the
interphalangeal joints.
IMPRESSION: No acute osseous findings.  Post-traumatic deformities as
described.

## 2013-10-06 IMAGING — CR DG LUMBAR SPINE COMPLETE 4+V
5 series · 5 of 5 positions shown · non-contrast
Comparison: Lumbar spine radiographs 12/20/2006.

CLINICAL DATA: Low back pain status post fall today.

LUMBAR SPINE - COMPLETE 4+ VIEW

[view not recorded (1 of 5)]
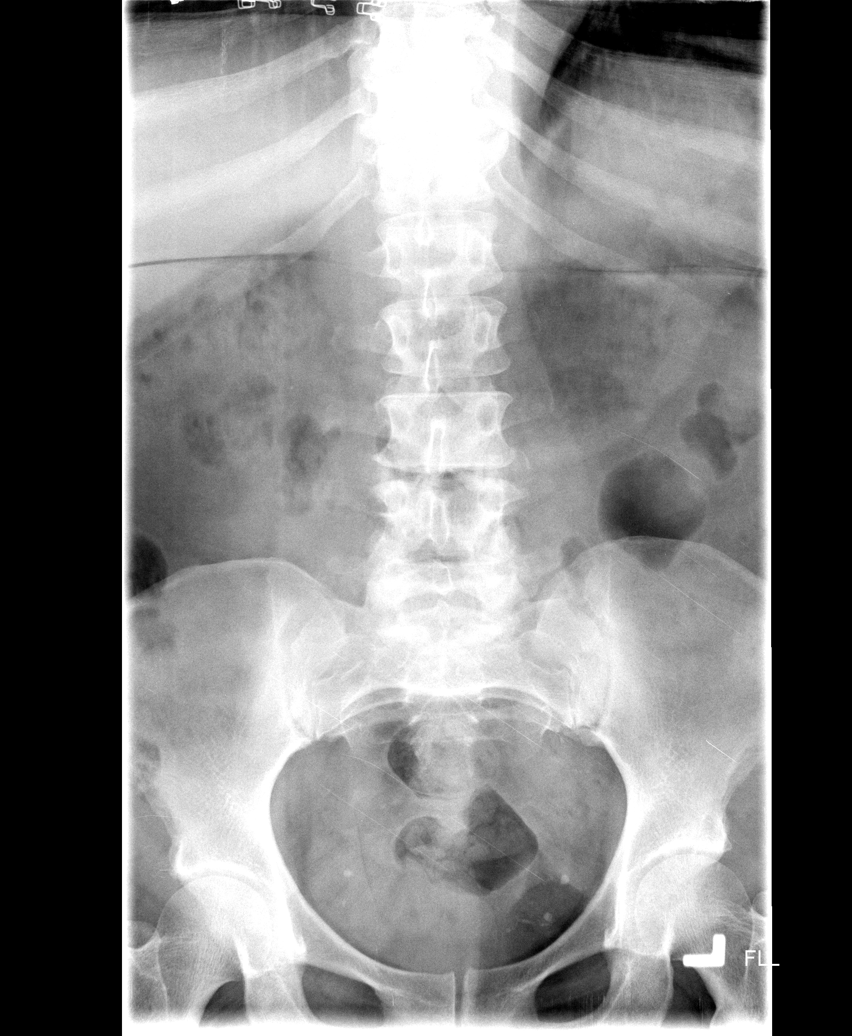

[view not recorded (2 of 5)]
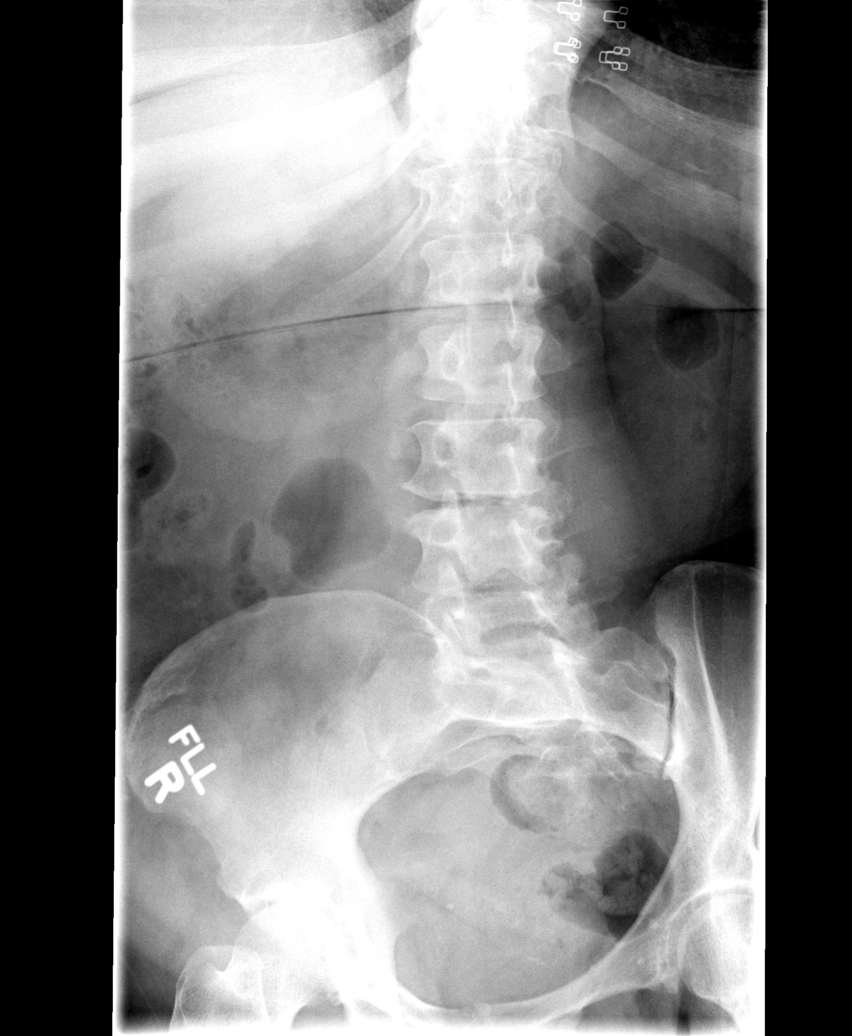

[view not recorded (3 of 5)]
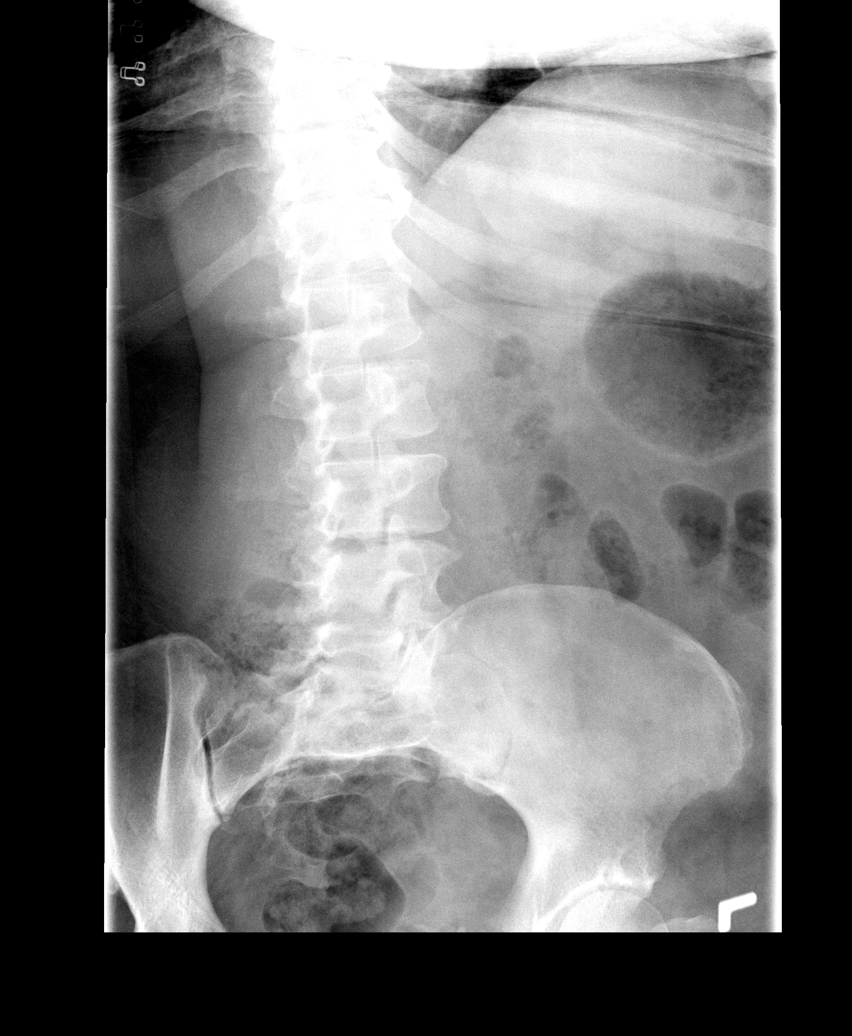

[view not recorded (4 of 5)]
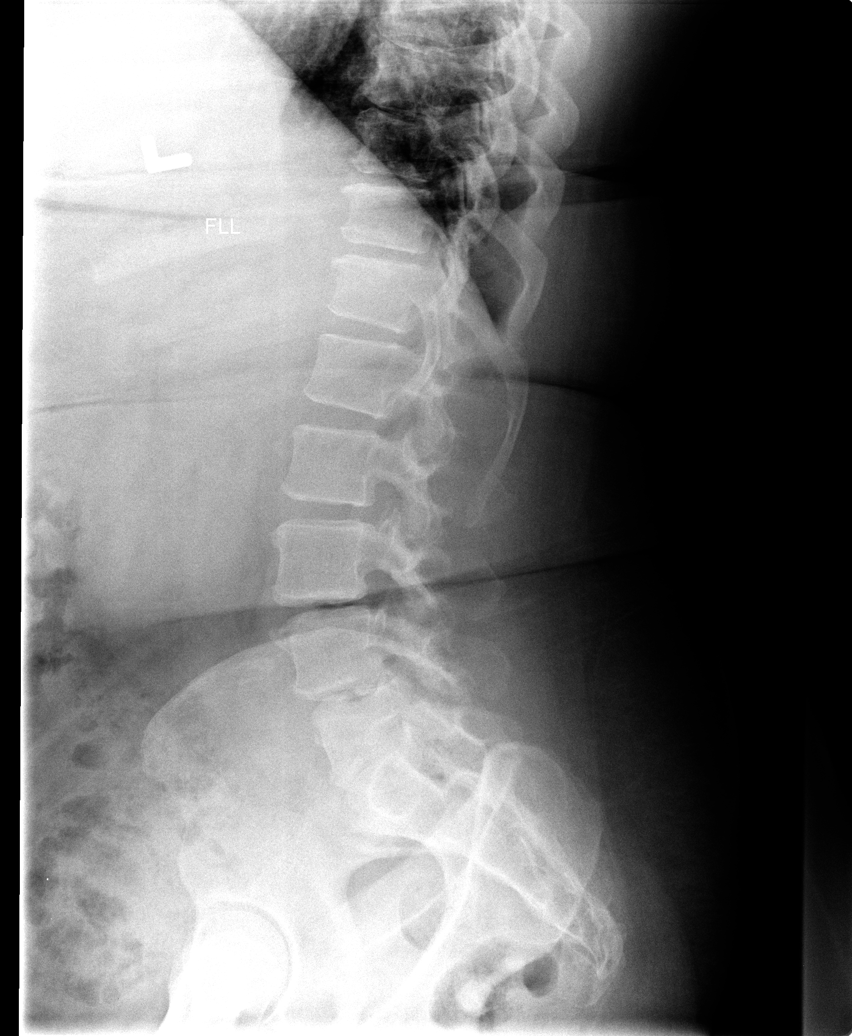

[view not recorded (5 of 5)]
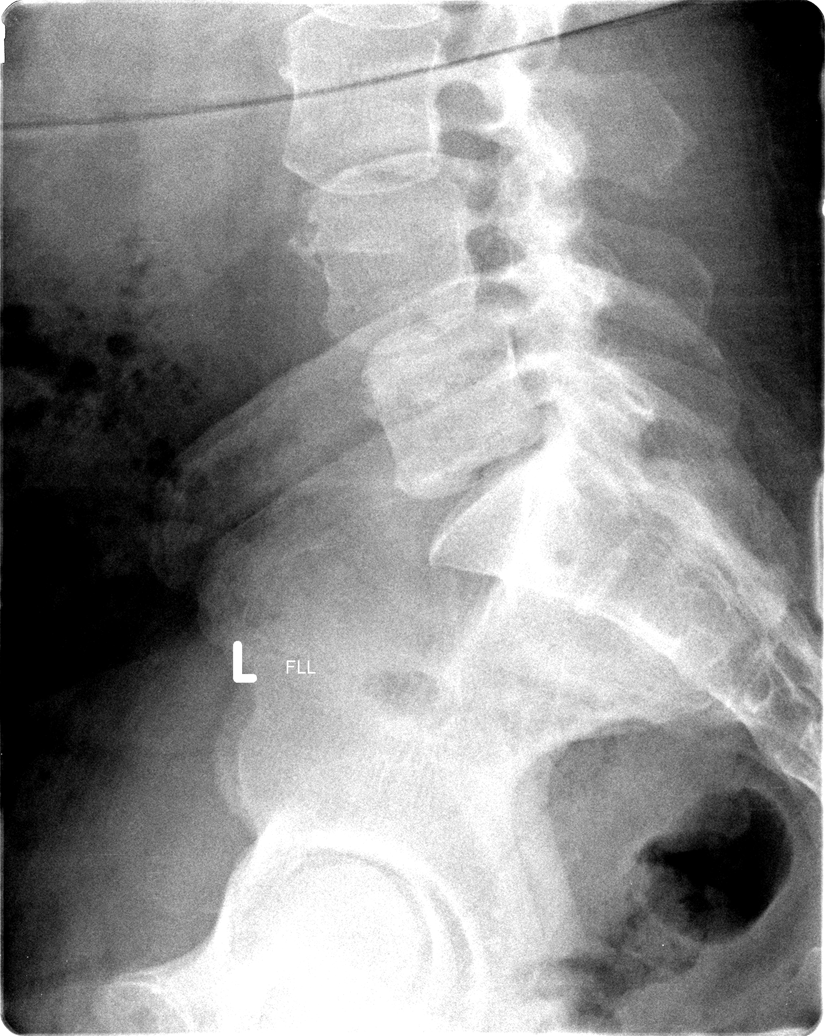

[5 of 5 positions shown; findings below may reference images not displayed]

FINDINGS: There are five lumbar type vertebral bodies.  The
alignment is stable with a grade 1 anterolisthesis at L3-L4 and L4-
L5.  Vacuum phenomenon within those discs appears progressive.
There is no evidence of acute fracture or traumatic subluxation.
Lower lumbar spine facet degenerative changes are noted.
IMPRESSION: Progressive lower lumbar spine disc degeneration with stable
alignment.  No acute osseous findings.

## 2013-10-11 ENCOUNTER — Other Ambulatory Visit: Payer: Self-pay | Admitting: Family Medicine

## 2013-10-13 ENCOUNTER — Telehealth: Payer: Self-pay

## 2013-10-13 NOTE — Telephone Encounter (Signed)
fyi

## 2013-10-30 ENCOUNTER — Encounter (INDEPENDENT_AMBULATORY_CARE_PROVIDER_SITE_OTHER): Payer: Self-pay

## 2013-10-30 ENCOUNTER — Encounter: Payer: Self-pay | Admitting: Family Medicine

## 2013-10-30 ENCOUNTER — Ambulatory Visit (INDEPENDENT_AMBULATORY_CARE_PROVIDER_SITE_OTHER): Payer: PRIVATE HEALTH INSURANCE | Admitting: Family Medicine

## 2013-10-30 VITALS — BP 206/140 | HR 100 | Resp 18 | Ht 62.0 in | Wt 184.0 lb

## 2013-10-30 DIAGNOSIS — R5381 Other malaise: Secondary | ICD-10-CM

## 2013-10-30 DIAGNOSIS — IMO0001 Reserved for inherently not codable concepts without codable children: Secondary | ICD-10-CM

## 2013-10-30 DIAGNOSIS — Z9119 Patient's noncompliance with other medical treatment and regimen: Secondary | ICD-10-CM

## 2013-10-30 DIAGNOSIS — Z91199 Patient's noncompliance with other medical treatment and regimen due to unspecified reason: Secondary | ICD-10-CM

## 2013-10-30 DIAGNOSIS — F3289 Other specified depressive episodes: Secondary | ICD-10-CM

## 2013-10-30 DIAGNOSIS — F17200 Nicotine dependence, unspecified, uncomplicated: Secondary | ICD-10-CM

## 2013-10-30 DIAGNOSIS — E785 Hyperlipidemia, unspecified: Secondary | ICD-10-CM

## 2013-10-30 DIAGNOSIS — I10 Essential (primary) hypertension: Secondary | ICD-10-CM

## 2013-10-30 DIAGNOSIS — R5383 Other fatigue: Secondary | ICD-10-CM

## 2013-10-30 DIAGNOSIS — Z794 Long term (current) use of insulin: Secondary | ICD-10-CM

## 2013-10-30 DIAGNOSIS — F329 Major depressive disorder, single episode, unspecified: Secondary | ICD-10-CM

## 2013-10-30 DIAGNOSIS — E1165 Type 2 diabetes mellitus with hyperglycemia: Secondary | ICD-10-CM

## 2013-10-30 LAB — CBC
HCT: 50 % — ABNORMAL HIGH (ref 36.0–46.0)
Hemoglobin: 17.5 g/dL — ABNORMAL HIGH (ref 12.0–15.0)
MCH: 32.9 pg (ref 26.0–34.0)
MCHC: 35 g/dL (ref 30.0–36.0)
MCV: 94 fL (ref 78.0–100.0)
Platelets: 246 10*3/uL (ref 150–400)
RBC: 5.32 MIL/uL — ABNORMAL HIGH (ref 3.87–5.11)
RDW: 13.6 % (ref 11.5–15.5)
WBC: 7.5 10*3/uL (ref 4.0–10.5)

## 2013-10-30 LAB — GLUCOSE, POCT (MANUAL RESULT ENTRY): POC Glucose: 249 mg/dl — AB (ref 70–99)

## 2013-10-30 MED ORDER — FLUOXETINE HCL 20 MG PO CAPS: 20.0000 mg | ORAL_CAPSULE | Freq: Every day | ORAL | Status: DC

## 2013-10-30 NOTE — Patient Instructions (Addendum)
F/u in 6 weeks  Make appt with mental health  3 units insulin today, you will need to see Dr Dorris Fetch about your diabetes I will enter  a referral  Increase proazc dose to 20 mg daily  Lpipid. cmp and eGFr, HBA1C, cBc, tSH today  Pls commit to stopping smoking
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
## 2013-10-31 DIAGNOSIS — E1065 Type 1 diabetes mellitus with hyperglycemia: Secondary | ICD-10-CM

## 2013-10-31 DIAGNOSIS — IMO0002 Reserved for concepts with insufficient information to code with codable children: Secondary | ICD-10-CM

## 2013-10-31 LAB — LIPID PANEL
Cholesterol: 322 mg/dL — ABNORMAL HIGH (ref 0–200)
HDL: 57 mg/dL (ref >39)
LDL Cholesterol: 191 mg/dL — ABNORMAL HIGH (ref 0–99)
Total CHOL/HDL Ratio: 5.6 Ratio
Triglycerides: 372 mg/dL — ABNORMAL HIGH (ref <150)
VLDL: 74 mg/dL — ABNORMAL HIGH (ref 0–40)

## 2013-10-31 LAB — COMPLETE METABOLIC PANEL WITH GFR
ALT: 20 U/L (ref 0–35)
AST: 15 U/L (ref 0–37)
Albumin: 3.9 g/dL (ref 3.5–5.2)
Alkaline Phosphatase: 114 U/L (ref 39–117)
BUN: 12 mg/dL (ref 6–23)
CO2: 25 mEq/L (ref 19–32)
Calcium: 9.7 mg/dL (ref 8.4–10.5)
Chloride: 102 mEq/L (ref 96–112)
Creat: 0.87 mg/dL (ref 0.50–1.10)
GFR, Est African American: >89 mL/min
GFR, Est Non African American: 78 mL/min
Glucose, Bld: 268 mg/dL — ABNORMAL HIGH (ref 70–99)
Potassium: 4.2 mEq/L (ref 3.5–5.3)
Sodium: 137 mEq/L (ref 135–145)
Total Bilirubin: 0.8 mg/dL (ref 0.3–1.2)
Total Protein: 6.8 g/dL (ref 6.0–8.3)

## 2013-10-31 LAB — TSH: TSH: 1.178 u[IU]/mL (ref 0.350–4.500)

## 2013-10-31 LAB — HEMOGLOBIN A1C
Hgb A1c MFr Bld: 10.9 % — ABNORMAL HIGH (ref <5.7)
Mean Plasma Glucose: 266 mg/dL — ABNORMAL HIGH (ref <117)

## 2013-10-31 MED ORDER — INSULIN ASPART 100 UNIT/ML ~~LOC~~ SOLN
3.0000 [IU] | Freq: Once | SUBCUTANEOUS | Status: AC
  Administered 2013-10-31: 3 [IU] via SUBCUTANEOUS

## 2013-11-01 ENCOUNTER — Other Ambulatory Visit: Payer: Self-pay | Admitting: Family Medicine

## 2013-11-01 ENCOUNTER — Other Ambulatory Visit: Payer: Self-pay

## 2013-11-01 DIAGNOSIS — Z794 Long term (current) use of insulin: Principal | ICD-10-CM

## 2013-11-01 DIAGNOSIS — IMO0001 Reserved for inherently not codable concepts without codable children: Secondary | ICD-10-CM

## 2013-11-01 DIAGNOSIS — E1165 Type 2 diabetes mellitus with hyperglycemia: Principal | ICD-10-CM

## 2013-11-01 MED ORDER — TRAMADOL HCL 50 MG PO TBDP: ORAL_TABLET | ORAL | Status: DC

## 2013-11-01 MED ORDER — ROSUVASTATIN CALCIUM 40 MG PO TABS: 40.0000 mg | ORAL_TABLET | Freq: Every day | ORAL | Status: DC

## 2013-11-02 ENCOUNTER — Telehealth: Payer: Self-pay | Admitting: Family Medicine

## 2013-11-02 NOTE — Telephone Encounter (Signed)
pls refer her to dr Gershon Crane for diabetic eye exam, will give you hand written referral, explain to her appt may be delayed since her sugar not currently controlled please

## 2013-11-03 NOTE — Telephone Encounter (Signed)
Patient wants to go to [facility] on [address] to see Dr. Jorja Loa

## 2013-11-17 ENCOUNTER — Inpatient Hospital Stay (HOSPITAL_COMMUNITY): Payer: PRIVATE HEALTH INSURANCE

## 2013-11-17 ENCOUNTER — Encounter (HOSPITAL_COMMUNITY): Payer: Self-pay | Admitting: Emergency Medicine

## 2013-11-17 ENCOUNTER — Emergency Department (HOSPITAL_COMMUNITY): Payer: PRIVATE HEALTH INSURANCE

## 2013-11-17 ENCOUNTER — Inpatient Hospital Stay (HOSPITAL_COMMUNITY)
Admission: EM | Admit: 2013-11-17 | Discharge: 2013-11-21 | DRG: 065 | Disposition: A | Discharge status: Home Health | Payer: PRIVATE HEALTH INSURANCE | Attending: Family Medicine | Admitting: Family Medicine

## 2013-11-17 DIAGNOSIS — Z0000 Encounter for general adult medical examination without abnormal findings: Secondary | ICD-10-CM

## 2013-11-17 DIAGNOSIS — E11621 Type 2 diabetes mellitus with foot ulcer: Secondary | ICD-10-CM | POA: Diagnosis present

## 2013-11-17 DIAGNOSIS — E11610 Type 2 diabetes mellitus with diabetic neuropathic arthropathy: Secondary | ICD-10-CM

## 2013-11-17 DIAGNOSIS — E1149 Type 2 diabetes mellitus with other diabetic neurological complication: Secondary | ICD-10-CM | POA: Diagnosis present

## 2013-11-17 DIAGNOSIS — Z7982 Long term (current) use of aspirin: Secondary | ICD-10-CM

## 2013-11-17 DIAGNOSIS — E1140 Type 2 diabetes mellitus with diabetic neuropathy, unspecified: Secondary | ICD-10-CM

## 2013-11-17 DIAGNOSIS — Z91199 Patient's noncompliance with other medical treatment and regimen due to unspecified reason: Secondary | ICD-10-CM

## 2013-11-17 DIAGNOSIS — S8263XA Displaced fracture of lateral malleolus of unspecified fibula, initial encounter for closed fracture: Secondary | ICD-10-CM

## 2013-11-17 DIAGNOSIS — I6381 Other cerebral infarction due to occlusion or stenosis of small artery: Secondary | ICD-10-CM

## 2013-11-17 DIAGNOSIS — I6350 Cerebral infarction due to unspecified occlusion or stenosis of unspecified cerebral artery: Principal | ICD-10-CM

## 2013-11-17 DIAGNOSIS — E1165 Type 2 diabetes mellitus with hyperglycemia: Secondary | ICD-10-CM

## 2013-11-17 DIAGNOSIS — Z720 Tobacco use: Secondary | ICD-10-CM

## 2013-11-17 DIAGNOSIS — I110 Hypertensive heart disease with heart failure: Secondary | ICD-10-CM | POA: Diagnosis present

## 2013-11-17 DIAGNOSIS — Z825 Family history of asthma and other chronic lower respiratory diseases: Secondary | ICD-10-CM

## 2013-11-17 DIAGNOSIS — F329 Major depressive disorder, single episode, unspecified: Secondary | ICD-10-CM

## 2013-11-17 DIAGNOSIS — I498 Other specified cardiac arrhythmias: Secondary | ICD-10-CM | POA: Diagnosis present

## 2013-11-17 DIAGNOSIS — E1152 Type 2 diabetes mellitus with diabetic peripheral angiopathy with gangrene: Secondary | ICD-10-CM | POA: Diagnosis present

## 2013-11-17 DIAGNOSIS — N6320 Unspecified lump in the left breast, unspecified quadrant: Secondary | ICD-10-CM

## 2013-11-17 DIAGNOSIS — E7800 Pure hypercholesterolemia, unspecified: Secondary | ICD-10-CM | POA: Diagnosis present

## 2013-11-17 DIAGNOSIS — T7491XA Unspecified adult maltreatment, confirmed, initial encounter: Secondary | ICD-10-CM

## 2013-11-17 DIAGNOSIS — F411 Generalized anxiety disorder: Secondary | ICD-10-CM

## 2013-11-17 DIAGNOSIS — Z8719 Personal history of other diseases of the digestive system: Secondary | ICD-10-CM

## 2013-11-17 DIAGNOSIS — F1010 Alcohol abuse, uncomplicated: Secondary | ICD-10-CM

## 2013-11-17 DIAGNOSIS — R5381 Other malaise: Secondary | ICD-10-CM

## 2013-11-17 DIAGNOSIS — I639 Cerebral infarction, unspecified: Secondary | ICD-10-CM | POA: Diagnosis present

## 2013-11-17 DIAGNOSIS — M6281 Muscle weakness (generalized): Secondary | ICD-10-CM

## 2013-11-17 DIAGNOSIS — Z7409 Other reduced mobility: Secondary | ICD-10-CM

## 2013-11-17 DIAGNOSIS — R5383 Other fatigue: Secondary | ICD-10-CM

## 2013-11-17 DIAGNOSIS — Z6834 Body mass index (BMI) 34.0-34.9, adult: Secondary | ICD-10-CM

## 2013-11-17 DIAGNOSIS — Z794 Long term (current) use of insulin: Secondary | ICD-10-CM

## 2013-11-17 DIAGNOSIS — M25519 Pain in unspecified shoulder: Secondary | ICD-10-CM

## 2013-11-17 DIAGNOSIS — L97509 Non-pressure chronic ulcer of other part of unspecified foot with unspecified severity: Secondary | ICD-10-CM

## 2013-11-17 DIAGNOSIS — I10 Essential (primary) hypertension: Secondary | ICD-10-CM

## 2013-11-17 DIAGNOSIS — F3289 Other specified depressive episodes: Secondary | ICD-10-CM

## 2013-11-17 DIAGNOSIS — Z8249 Family history of ischemic heart disease and other diseases of the circulatory system: Secondary | ICD-10-CM

## 2013-11-17 DIAGNOSIS — I161 Hypertensive emergency: Secondary | ICD-10-CM

## 2013-11-17 DIAGNOSIS — Z79899 Other long term (current) drug therapy: Secondary | ICD-10-CM

## 2013-11-17 DIAGNOSIS — J189 Pneumonia, unspecified organism: Secondary | ICD-10-CM

## 2013-11-17 DIAGNOSIS — Z9119 Patient's noncompliance with other medical treatment and regimen: Secondary | ICD-10-CM

## 2013-11-17 DIAGNOSIS — G4700 Insomnia, unspecified: Secondary | ICD-10-CM

## 2013-11-17 DIAGNOSIS — M79673 Pain in unspecified foot: Secondary | ICD-10-CM

## 2013-11-17 DIAGNOSIS — S82892A Other fracture of left lower leg, initial encounter for closed fracture: Secondary | ICD-10-CM

## 2013-11-17 DIAGNOSIS — I43 Cardiomyopathy in diseases classified elsewhere: Secondary | ICD-10-CM | POA: Diagnosis present

## 2013-11-17 DIAGNOSIS — M67919 Unspecified disorder of synovium and tendon, unspecified shoulder: Secondary | ICD-10-CM

## 2013-11-17 DIAGNOSIS — I119 Hypertensive heart disease without heart failure: Secondary | ICD-10-CM

## 2013-11-17 DIAGNOSIS — E119 Type 2 diabetes mellitus without complications: Secondary | ICD-10-CM

## 2013-11-17 DIAGNOSIS — J9600 Acute respiratory failure, unspecified whether with hypoxia or hypercapnia: Secondary | ICD-10-CM

## 2013-11-17 DIAGNOSIS — I509 Heart failure, unspecified: Secondary | ICD-10-CM

## 2013-11-17 DIAGNOSIS — F17200 Nicotine dependence, unspecified, uncomplicated: Secondary | ICD-10-CM

## 2013-11-17 DIAGNOSIS — K625 Hemorrhage of anus and rectum: Secondary | ICD-10-CM | POA: Diagnosis present

## 2013-11-17 DIAGNOSIS — E669 Obesity, unspecified: Secondary | ICD-10-CM

## 2013-11-17 DIAGNOSIS — E785 Hyperlipidemia, unspecified: Secondary | ICD-10-CM

## 2013-11-17 DIAGNOSIS — R739 Hyperglycemia, unspecified: Secondary | ICD-10-CM

## 2013-11-17 DIAGNOSIS — R0603 Acute respiratory distress: Secondary | ICD-10-CM

## 2013-11-17 DIAGNOSIS — M75101 Unspecified rotator cuff tear or rupture of right shoulder, not specified as traumatic: Secondary | ICD-10-CM

## 2013-11-17 DIAGNOSIS — Z833 Family history of diabetes mellitus: Secondary | ICD-10-CM

## 2013-11-17 DIAGNOSIS — M549 Dorsalgia, unspecified: Secondary | ICD-10-CM

## 2013-11-17 DIAGNOSIS — E876 Hypokalemia: Secondary | ICD-10-CM

## 2013-11-17 DIAGNOSIS — IMO0001 Reserved for inherently not codable concepts without codable children: Secondary | ICD-10-CM

## 2013-11-17 DIAGNOSIS — M719 Bursopathy, unspecified: Secondary | ICD-10-CM

## 2013-11-17 LAB — CBC WITH DIFFERENTIAL/PLATELET
Basophils Absolute: 0.1 10*3/uL (ref 0.0–0.1)
Basophils Relative: 1 % (ref 0–1)
Eosinophils Absolute: 0.1 10*3/uL (ref 0.0–0.7)
Eosinophils Relative: 2 % (ref 0–5)
HCT: 46.2 % — ABNORMAL HIGH (ref 36.0–46.0)
Hemoglobin: 16.2 g/dL — ABNORMAL HIGH (ref 12.0–15.0)
Lymphocytes Relative: 19 % (ref 12–46)
Lymphs Abs: 1.5 10*3/uL (ref 0.7–4.0)
MCH: 32.5 pg (ref 26.0–34.0)
MCHC: 35.1 g/dL (ref 30.0–36.0)
MCV: 92.8 fL (ref 78.0–100.0)
Monocytes Absolute: 0.4 10*3/uL (ref 0.1–1.0)
Monocytes Relative: 5 % (ref 3–12)
Neutro Abs: 5.4 10*3/uL (ref 1.7–7.7)
Neutrophils Relative %: 73 % (ref 43–77)
Platelets: 202 10*3/uL (ref 150–400)
RBC: 4.98 MIL/uL (ref 3.87–5.11)
RDW: 12.7 % (ref 11.5–15.5)
WBC: 7.5 10*3/uL (ref 4.0–10.5)

## 2013-11-17 LAB — URINALYSIS, ROUTINE W REFLEX MICROSCOPIC
Bilirubin Urine: NEGATIVE
Glucose, UA: >1000 mg/dL — AB
Hgb urine dipstick: NEGATIVE
Ketones, ur: NEGATIVE mg/dL
Leukocytes, UA: NEGATIVE
Nitrite: NEGATIVE
Protein, ur: >300 mg/dL — AB
Specific Gravity, Urine: 1.025 (ref 1.005–1.030)
Urobilinogen, UA: 0.2 mg/dL (ref 0.0–1.0)
pH: 6.5 (ref 5.0–8.0)

## 2013-11-17 LAB — COMPREHENSIVE METABOLIC PANEL
ALT: 14 U/L (ref 0–35)
AST: 13 U/L (ref 0–37)
Albumin: 3.1 g/dL — ABNORMAL LOW (ref 3.5–5.2)
Alkaline Phosphatase: 96 U/L (ref 39–117)
BUN: 11 mg/dL (ref 6–23)
CO2: 26 mEq/L (ref 19–32)
Calcium: 9.5 mg/dL (ref 8.4–10.5)
Chloride: 100 mEq/L (ref 96–112)
Creatinine, Ser: 0.8 mg/dL (ref 0.50–1.10)
GFR calc Af Amer: >90 mL/min (ref >90)
GFR calc non Af Amer: 85 mL/min — ABNORMAL LOW (ref >90)
Glucose, Bld: 365 mg/dL — ABNORMAL HIGH (ref 70–99)
Potassium: 3.6 mEq/L — ABNORMAL LOW (ref 3.7–5.3)
Sodium: 139 mEq/L (ref 137–147)
Total Bilirubin: 0.6 mg/dL (ref 0.3–1.2)
Total Protein: 6.9 g/dL (ref 6.0–8.3)

## 2013-11-17 LAB — TROPONIN I: Troponin I: <0.3 ng/mL (ref <0.30)

## 2013-11-17 LAB — PRO B NATRIURETIC PEPTIDE: Pro B Natriuretic peptide (BNP): 506.2 pg/mL — ABNORMAL HIGH (ref 0–125)

## 2013-11-17 LAB — GLUCOSE, CAPILLARY
Glucose-Capillary: 172 mg/dL — ABNORMAL HIGH (ref 70–99)
Glucose-Capillary: 409 mg/dL — ABNORMAL HIGH (ref 70–99)

## 2013-11-17 LAB — URINE MICROSCOPIC-ADD ON

## 2013-11-17 MED ORDER — PANTOPRAZOLE SODIUM 40 MG PO TBEC
40.0000 mg | DELAYED_RELEASE_TABLET | Freq: Every day | ORAL | Status: DC
  Administered 2013-11-17 – 2013-11-21 (×5): 40 mg via ORAL
  Filled 2013-11-17 (×5): qty 1

## 2013-11-17 MED ORDER — ASPIRIN EC 325 MG PO TBEC
325.0000 mg | DELAYED_RELEASE_TABLET | Freq: Every day | ORAL | Status: DC
  Administered 2013-11-17 – 2013-11-18 (×2): 325 mg via ORAL
  Filled 2013-11-17 (×2): qty 1

## 2013-11-17 MED ORDER — INSULIN ASPART 100 UNIT/ML ~~LOC~~ SOLN
0.0000 [IU] | Freq: Every day | SUBCUTANEOUS | Status: DC
  Administered 2013-11-17: 5 [IU] via SUBCUTANEOUS

## 2013-11-17 MED ORDER — SODIUM CHLORIDE 0.9 % IV BOLUS (SEPSIS)
500.0000 mL | Freq: Once | INTRAVENOUS | Status: AC
  Administered 2013-11-17: 15:00:00 via INTRAVENOUS

## 2013-11-17 MED ORDER — FUROSEMIDE 40 MG PO TABS
40.0000 mg | ORAL_TABLET | Freq: Every day | ORAL | Status: DC
  Administered 2013-11-17 – 2013-11-21 (×5): 40 mg via ORAL
  Filled 2013-11-17 (×5): qty 1

## 2013-11-17 MED ORDER — BENAZEPRIL HCL 10 MG PO TABS
20.0000 mg | ORAL_TABLET | Freq: Every day | ORAL | Status: DC
  Administered 2013-11-17 – 2013-11-18 (×2): 20 mg via ORAL
  Filled 2013-11-17 (×2): qty 2

## 2013-11-17 MED ORDER — AMLODIPINE BESYLATE 5 MG PO TABS
10.0000 mg | ORAL_TABLET | Freq: Every day | ORAL | Status: DC
  Administered 2013-11-17 – 2013-11-21 (×5): 10 mg via ORAL
  Filled 2013-11-17 (×5): qty 2

## 2013-11-17 MED ORDER — ATORVASTATIN CALCIUM 10 MG PO TABS
10.0000 mg | ORAL_TABLET | Freq: Every day | ORAL | Status: DC
  Administered 2013-11-18 – 2013-11-20 (×3): 10 mg via ORAL
  Filled 2013-11-17 (×4): qty 1

## 2013-11-17 MED ORDER — SODIUM CHLORIDE 0.9 % IV SOLN
INTRAVENOUS | Status: DC
  Administered 2013-11-17: 21:00:00 via INTRAVENOUS

## 2013-11-17 MED ORDER — HYDRALAZINE HCL 20 MG/ML IJ SOLN
5.0000 mg | INTRAMUSCULAR | Status: DC | PRN
  Administered 2013-11-17 – 2013-11-18 (×2): 5 mg via INTRAVENOUS
  Filled 2013-11-17 (×2): qty 1

## 2013-11-17 MED ORDER — INSULIN ASPART 100 UNIT/ML ~~LOC~~ SOLN
0.0000 [IU] | Freq: Three times a day (TID) | SUBCUTANEOUS | Status: DC
  Administered 2013-11-18: 5 [IU] via SUBCUTANEOUS
  Administered 2013-11-18: 8 [IU] via SUBCUTANEOUS

## 2013-11-17 MED ORDER — CLONIDINE HCL 0.2 MG PO TABS
0.3000 mg | ORAL_TABLET | Freq: Once | ORAL | Status: AC
  Administered 2013-11-17: 0.3 mg via ORAL
  Filled 2013-11-17 (×2): qty 1

## 2013-11-17 MED ORDER — SODIUM CHLORIDE 0.9 % IV SOLN: INTRAVENOUS | Status: DC

## 2013-11-17 MED ORDER — FLUOXETINE HCL 20 MG PO CAPS
20.0000 mg | ORAL_CAPSULE | Freq: Every day | ORAL | Status: DC
  Administered 2013-11-17 – 2013-11-21 (×5): 20 mg via ORAL
  Filled 2013-11-17 (×5): qty 1

## 2013-11-17 MED ORDER — CLONIDINE HCL 0.3 MG/24HR TD PTWK
0.3000 mg | MEDICATED_PATCH | TRANSDERMAL | Status: DC
  Administered 2013-11-17: 0.3 mg via TRANSDERMAL
  Filled 2013-11-17: qty 1

## 2013-11-17 MED ORDER — NICOTINE 14 MG/24HR TD PT24
14.0000 mg | MEDICATED_PATCH | Freq: Every day | TRANSDERMAL | Status: DC
  Administered 2013-11-17 – 2013-11-21 (×5): 14 mg via TRANSDERMAL
  Filled 2013-11-17 (×5): qty 1

## 2013-11-17 MED ORDER — SENNOSIDES-DOCUSATE SODIUM 8.6-50 MG PO TABS: 1.0000 | ORAL_TABLET | Freq: Every evening | ORAL | Status: DC | PRN

## 2013-11-17 MED ORDER — ALBUTEROL SULFATE (2.5 MG/3ML) 0.083% IN NEBU: INHALATION_SOLUTION | Freq: Four times a day (QID) | RESPIRATORY_TRACT | Status: DC | PRN

## 2013-11-17 MED ORDER — INSULIN ASPART 100 UNIT/ML ~~LOC~~ SOLN
10.0000 [IU] | Freq: Once | SUBCUTANEOUS | Status: AC
Start: 2013-11-17 — End: 2013-11-17
  Administered 2013-11-17: 10 [IU] via INTRAVENOUS

## 2013-11-17 MED ORDER — POTASSIUM CHLORIDE CRYS ER 10 MEQ PO TBCR
30.0000 meq | EXTENDED_RELEASE_TABLET | Freq: Every day | ORAL | Status: DC
  Administered 2013-11-17 – 2013-11-21 (×5): 30 meq via ORAL
  Filled 2013-11-17 (×5): qty 3

## 2013-11-17 MED ORDER — ENOXAPARIN SODIUM 40 MG/0.4ML ~~LOC~~ SOLN
40.0000 mg | SUBCUTANEOUS | Status: DC
  Administered 2013-11-17 – 2013-11-20 (×4): 40 mg via SUBCUTANEOUS
  Filled 2013-11-17 (×4): qty 0.4

## 2013-11-17 MED ORDER — INSULIN REGULAR BOLUS VIA INFUSION
10.0000 [IU] | Freq: Once | INTRAVENOUS | Status: DC
  Filled 2013-11-17: qty 10

## 2013-11-17 NOTE — ED Notes (Signed)
Pt is in mri at present.

## 2013-11-17 NOTE — ED Notes (Addendum)
Pt has Rx  From Dr Dorris Fetch, written 1/15 with Bp of 202/132 and A1C of 10.9  Sent here for eval and treatment.  Says she feels weak   Pain rt shoulder for 1 month

## 2013-11-17 NOTE — ED Notes (Signed)
Dr. Sarajane Jews to see the pt.
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
## 2013-11-17 NOTE — ED Notes (Signed)
Pt returned from mri.  Meal tray given. Attempted to call report.nurse will return call.

## 2013-11-17 NOTE — H&P (Signed)
Patient seen, independently examined and chart reviewed. I agree with exam, assessment and plan discussed with Dyanne Carrel, NP.  Very pleasant 48 year old woman with history of poorly-controlled hypertension and diabetes presents to the emergency department with complaints of right-sided hand weakness. Initial evaluation suggested acute thalamic infarct and she was referred for further evaluation.  She is quite honest and forthcoming and states that she does not take her medications correctly and does not check her blood sugars often. She reports she has had problems with her right hand and arm for approximately one month now, difficulty writing and gripping a fork. She is right-handed. No right leg weakness. She reports one fall at home but she does not think is significant. No left-sided problems. No facial weakness or difficulty swallowing. She does report her daughter noticed slurred speech approximately one month ago which seems to be intermittent in nature.  She was advised by her primary care physician to come to the emergency Department 1/5 by report but did not come. She was seen by her endocrinologist 1/15 who recommended she come to the emergency department for elevated blood pressure but she did not come.   She came to the emergency department today because her right hand symptoms are not improving.  PMH Diabetes mellitus type 2 with Charcot foot Hypertension Hyperlipidemia Anxiety Grade 1 diastolic dysfunction Cigarette smoker  Noted to be hypertensive in the emergency department but vitals otherwise stable. She appears calm and comfortable. Speech is fluent and clear, no dysarthria. Normal speech. Cranial nerves 2-12 are intact. No upper extremity dysdiadochokinesis. No pronator drift. Tone and strength in the upper and lower extremities is 5/5 and symmetric. Grip strength on the right is symmetric to the left. Finger abduction and adduction on the right is grossly normal. Sensation  hands and feet intact. Cardiovascular regular rate and rhythm. No murmur, rub or gallop. Respiratory clear to auscultation bilaterally. No wheezes, rales or rhonchi. Normal respiratory effort. Abdomen soft, nontender, nondistended. Skin grossly unremarkable.  Blood sugars 365. Troponin negative. Complete metabolic panel unremarkable. CBC unremarkable. Urinalysis unremarkable. CT had suggested small acute infarct left thalamus.chest x-ray unremarkable. MRI brain reveals small subacute nonhemorrhagic right cerebellar infarct. Small vessel ischemic disease. MRI with prominent intracranial atherosclerosis. EKG normal sinus rhythm, left axis deviation, left ventricular hypertrophy with repolarization abnormality, no significant change since last  Test 11/08/2012.  Admitted for acute CVA. History is quite confusing, her only reported deficits have been present for more than one month and she has no focal abnormalities on exam today. Presumably secondary to uncontrolled hypertension and diabetes secondary to noncompliance. Complete stroke evaluation. Continue statin, allow permissive hypertension but prevent going too high. Continue aspirin for now.  Murray Hodgkins, MD [facility] [phone]

## 2013-11-17 NOTE — ED Provider Notes (Addendum)
CSN: [id number]     Arrival date & time 11/17/13  1123 History   First MD Initiated Contact with Patient 11/17/13 1312     Chief Complaint  Patient presents with  . Weakness   (Consider location/radiation/quality/duration/timing/severity/associated sxs/prior Treatment) Patient is a 48 y.o. female presenting with weakness. The history is provided by the patient.  Weakness Pertinent negatives include no chest pain, no abdominal pain, no headaches and no shortness of breath.   patient states that current symptoms have been present since January 5. It should get upper extremity weakness right greater than left. Also some subtle lower extremity weakness. No speech problems no visual changes no facial droop. Patient was also seen by her and her can doctor on the 15th and was referred in to the hospital for evaluation of markedly elevated but pressure 207/132 however patient did not come in and get seen. Patient claims that Dr. Moshe Cipro on January 5 when her to come into the emergency department for stroke symptoms. Patient states she came in today because things are just not getting better. Patient is known to have poorly controlled hypertension and diabetes.  Past Medical History  Diagnosis Date  . Alcohol use     Now abstinent.  . Diabetes mellitus, type 2   . Obesity   . Hyperlipidemia   . Hypertension   . Anxiety   . Panic attacks   . Hypercholesteremia   . Diabetic Charcot foot 12/30/2011  . RECTAL BLEEDING, HX OF 12/05/2009    Qualifier: Diagnosis of  By: Zeb Comfort    . Ankle fracture, lateral malleolus, closed 12/30/2011  . Breast mass, left 12/15/2011  . Tobacco abuse   . ANXIETY 04/17/2010    Qualifier: Diagnosis of  By: Claybon Jabs PA, Dawn    . Hypertensive cardiomyopathy 11/08/2012    Ejection fraction 40-45%.  . Diastolic dysfunction [id number]    Grade 1.   Past Surgical History  Procedure Laterality Date  . Dilation and curettage of uterus    . Breast biopsy     Family  History  Problem Relation Age of Onset  . Hypertension Mother   . Hypertension Father     CABG   . Hypertension Sister   . Heart attack Mother   . Arthritis    . Cancer    . Diabetes    . Asthma     History  Substance Use Topics  . Smoking status: Current Every Day Smoker -- 0.50 packs/day    Types: Cigarettes  . Smokeless tobacco: Not on file  . Alcohol Use: No     Comment: stopped jan 2012   OB History   Grav Para Term Preterm Abortions TAB SAB Ect Mult Living                 Review of Systems  Constitutional: Positive for fatigue. Negative for fever.  HENT: Negative for congestion.   Eyes: Negative for visual disturbance.  Respiratory: Negative for shortness of breath.   Cardiovascular: Negative for chest pain.  Gastrointestinal: Negative for nausea, vomiting and abdominal pain.  Genitourinary: Negative for dysuria.  Musculoskeletal: Negative for back pain.  Skin: Negative for rash.  Neurological: Positive for weakness. Negative for dizziness, facial asymmetry, light-headedness, numbness and headaches.  Hematological: Does not bruise/bleed easily.  Psychiatric/Behavioral: Negative for confusion.    Allergies  Review of patient's allergies indicates no known allergies.  Home Medications   Current Outpatient Rx  Name  Route  Sig  Dispense  Refill  .  albuterol (PROVENTIL HFA;VENTOLIN HFA) 108 (90 BASE) MCG/ACT inhaler   Inhalation   Inhale 2 puffs into the lungs every 6 (six) hours as needed for wheezing.   1 Inhaler   2   . amLODipine (NORVASC) 10 MG tablet   Oral   Take 1 tablet (10 mg total) by mouth daily.   30 tablet   5   . aspirin EC 81 MG EC tablet   Oral   Take 1 tablet (81 mg total) by mouth daily.   30 tablet   1   . benazepril (LOTENSIN) 20 MG tablet   Oral   Take 1 tablet (20 mg total) by mouth daily.   30 tablet   3   . cloNIDine (CATAPRES - DOSED IN MG/24 HR) 0.3 mg/24hr patch      APPLY 1 PATCH ONTO THE SKIN ONCE WEEKLY. REMOVE  USED PATCH.   4 patch   3   . cloNIDine (CATAPRES) 0.3 MG tablet   Oral   Take 1 tablet (0.3 mg total) by mouth 3 (three) times daily.   90 tablet   2   . FLUoxetine (PROZAC) 20 MG capsule   Oral   Take 1 capsule (20 mg total) by mouth daily.   30 capsule   2     Dose increase effective 10/30/2013   . furosemide (LASIX) 40 MG tablet   Oral   Take 1 tablet (40 mg total) by mouth daily. "Fluid pill". For your heart.   30 tablet   3   . metFORMIN (GLUCOPHAGE) 1000 MG tablet   Oral   Take 1 tablet (1,000 mg total) by mouth 2 (two) times daily with a meal.   60 tablet   3   . pantoprazole (PROTONIX) 40 MG tablet   Oral   Take 1 tablet (40 mg total) by mouth daily.   30 tablet   3   . potassium chloride SA (K-DUR,KLOR-CON) 20 MEQ tablet   Oral   Take 20 mEq by mouth. One and one half daily         . rosuvastatin (CRESTOR) 40 MG tablet   Oral   Take 1 tablet (40 mg total) by mouth daily.   30 tablet   3     Discontinue pravastatin effective 11/01/2013, plea ...   . TraMADol HCl 50 MG TBDP      One tablet twice daily, as  needed, for uncontrolled back pain   60 tablet   3     Discontinue ibuprofen effective 11/01/2013    BP 172/108  Pulse 83  Temp(Src) 98.1 F (36.7 C) (Oral)  Resp 18  Ht 5\' 1"  (1.549 m)  Wt 182 lb (82.555 kg)  BMI 34.41 kg/m2  SpO2 98%  LMP 10/09/2013 Physical Exam  Nursing note and vitals reviewed. Constitutional: She is oriented to person, place, and time. She appears well-developed and well-nourished. No distress.  HENT:  Head: Normocephalic and atraumatic.  Mouth/Throat: Oropharynx is clear and moist.  Eyes: Conjunctivae and EOM are normal. Pupils are equal, round, and reactive to light.  Neck: Normal range of motion.  Cardiovascular: Normal rate, regular rhythm and normal heart sounds.   Pulmonary/Chest: Effort normal and breath sounds normal.  Abdominal: Soft. There is no tenderness.  Musculoskeletal: Normal range of  motion. She exhibits no edema.  Neurological: She is alert and oriented to person, place, and time. No cranial nerve deficit. She exhibits normal muscle tone. Coordination normal.  Patient subjectively feels as  if there is right arm weakness. On examination unable to distinguish any significant weakness. No arm drift. Lower extremity strength is good in bed patient was not walked to see if there was any gait abnormalities.  Skin: Skin is warm. No rash noted.    ED Course  Procedures (including critical care time) Labs Review Labs Reviewed  CBC WITH DIFFERENTIAL - Abnormal; Notable for the following:    Hemoglobin 16.2 (*)    HCT 46.2 (*)    All other components within normal limits  COMPREHENSIVE METABOLIC PANEL - Abnormal; Notable for the following:    Potassium 3.6 (*)    Glucose, Bld 365 (*)    Albumin 3.1 (*)    GFR calc non Af Amer 85 (*)    All other components within normal limits  URINALYSIS, ROUTINE W REFLEX MICROSCOPIC - Abnormal; Notable for the following:    APPearance CLOUDY (*)    Glucose, UA >1000 (*)    Protein, ur >300 (*)    All other components within normal limits  URINE MICROSCOPIC-ADD ON - Abnormal; Notable for the following:    Squamous Epithelial / LPF MANY (*)    Bacteria, UA MANY (*)    All other components within normal limits  PRO B NATRIURETIC PEPTIDE - Abnormal; Notable for the following:    Pro B Natriuretic peptide (BNP) 506.2 (*)    All other components within normal limits  GLUCOSE, CAPILLARY - Abnormal; Notable for the following:    Glucose-Capillary 172 (*)    All other components within normal limits  TROPONIN I   Results for orders placed during the hospital encounter of 11/17/13  CBC WITH DIFFERENTIAL      Result Value Range   WBC 7.5  4.0 - 10.5 K/uL   RBC 4.98  3.87 - 5.11 MIL/uL   Hemoglobin 16.2 (*) 12.0 - 15.0 g/dL   HCT 46.2 (*) 36.0 - 46.0 %   MCV 92.8  78.0 - 100.0 fL   MCH 32.5  26.0 - 34.0 pg   MCHC 35.1  30.0 - 36.0 g/dL    RDW 12.7  11.5 - 15.5 %   Platelets 202  150 - 400 K/uL   Neutrophils Relative % 73  43 - 77 %   Neutro Abs 5.4  1.7 - 7.7 K/uL   Lymphocytes Relative 19  12 - 46 %   Lymphs Abs 1.5  0.7 - 4.0 K/uL   Monocytes Relative 5  3 - 12 %   Monocytes Absolute 0.4  0.1 - 1.0 K/uL   Eosinophils Relative 2  0 - 5 %   Eosinophils Absolute 0.1  0.0 - 0.7 K/uL   Basophils Relative 1  0 - 1 %   Basophils Absolute 0.1  0.0 - 0.1 K/uL  COMPREHENSIVE METABOLIC PANEL      Result Value Range   Sodium 139  137 - 147 mEq/L   Potassium 3.6 (*) 3.7 - 5.3 mEq/L   Chloride 100  96 - 112 mEq/L   CO2 26  19 - 32 mEq/L   Glucose, Bld 365 (*) 70 - 99 mg/dL   BUN 11  6 - 23 mg/dL   Creatinine, Ser 0.80  0.50 - 1.10 mg/dL   Calcium 9.5  8.4 - 10.5 mg/dL   Total Protein 6.9  6.0 - 8.3 g/dL   Albumin 3.1 (*) 3.5 - 5.2 g/dL   AST 13  0 - 37 U/L   ALT 14  0 - 35  U/L   Alkaline Phosphatase 96  39 - 117 U/L   Total Bilirubin 0.6  0.3 - 1.2 mg/dL   GFR calc non Af Amer 85 (*) >90 mL/min   GFR calc Af Amer >90  >90 mL/min  TROPONIN I      Result Value Range   Troponin I <0.30  <0.30 ng/mL  URINALYSIS, ROUTINE W REFLEX MICROSCOPIC      Result Value Range   Color, Urine YELLOW  YELLOW   APPearance CLOUDY (*) CLEAR   Specific Gravity, Urine 1.025  1.005 - 1.030   pH 6.5  5.0 - 8.0   Glucose, UA >1000 (*) NEGATIVE mg/dL   Hgb urine dipstick NEGATIVE  NEGATIVE   Bilirubin Urine NEGATIVE  NEGATIVE   Ketones, ur NEGATIVE  NEGATIVE mg/dL   Protein, ur >300 (*) NEGATIVE mg/dL   Urobilinogen, UA 0.2  0.0 - 1.0 mg/dL   Nitrite NEGATIVE  NEGATIVE   Leukocytes, UA NEGATIVE  NEGATIVE  URINE MICROSCOPIC-ADD ON      Result Value Range   Squamous Epithelial / LPF MANY (*) RARE   Bacteria, UA MANY (*) RARE  PRO B NATRIURETIC PEPTIDE      Result Value Range   Pro B Natriuretic peptide (BNP) 506.2 (*) 0 - 125 pg/mL  GLUCOSE, CAPILLARY      Result Value Range   Glucose-Capillary 172 (*) 70 - 99 mg/dL    Imaging  Review Dg Chest 2 View  11/17/2013   CLINICAL DATA:  Weakness.  Smoker.  EXAM: CHEST  2 VIEW  COMPARISON:  11/11/2012  FINDINGS: The heart size and mediastinal contours are within normal limits. Both lungs are clear. The visualized skeletal structures are unremarkable.  IMPRESSION: No active cardiopulmonary disease.   Electronically Signed   By: Earle Gell M.D.   On: 11/17/2013 14:56   Ct Head Wo Contrast  11/17/2013   CLINICAL DATA:  Right arm heaviness and weakness  EXAM: CT HEAD WITHOUT CONTRAST  TECHNIQUE: Contiguous axial images were obtained from the base of the skull through the vertex without intravenous contrast. Study was obtained within 24 hr of patient's arrival the emergency department.  COMPARISON:  May 31, 2012  FINDINGS: The ventricles are upper normal in size with normal contour. A small cavum septum pellucidum and a small cavum vergae are anatomic variants. There is no demonstrable mass, hemorrhage, extra-axial fluid collection, or midline shift.  There is an acute small infarct in the mid left thalamus. No other acute infarct is identified. Patchy small vessel disease in the centra semiovale are stable.  The vertebrobasilar system is again noted to be somewhat dolichoectatic for age.  IMPRESSION: Small acute infarct in the left thalamus. No other acute infarct apparent. Stable periventricular small vessel disease. No hemorrhage or mass effect.  : Bony calvarium appears intact. The mastoid air cells are clear. A defect in the medial left orbital wall is probably due to old trauma. Fat from the medial left orbit extends into the area of depression, a stable finding.   Electronically Signed   By: Lowella Grip M.D.   On: 11/17/2013 14:32   Mr Jodene Nam Head Wo Contrast  11/17/2013   CLINICAL DATA:  Confusion. Altered mental status. Slurred speech. Weakness since 10/30/2013. Right greater on left upper extremity is some subtle lower extremity weakness. Recent episode of markedly elevated  blood pressure. Diabetic (poorly controlled) hypertensive (poorly controlled) hyperlipidemia obese smoker.  EXAM: MRI HEAD WITHOUT CONTRAST  MRA HEAD WITHOUT CONTRAST  TECHNIQUE: Multiplanar, multiecho pulse sequences of the brain and surrounding structures were obtained without intravenous contrast. Angiographic images of the head were obtained using MRA technique without contrast.  COMPARISON:  11/17/2013 CT.  09/24/2004 MR.  FINDINGS: MRI HEAD FINDINGS  Small subacute nonhemorrhagic right cerebellar infarct.  No intracranial hemorrhage.  Prominent progressive white matter type changes. Although several of the white matter changes involve the periventricular region, some of which appear perpendicular as can be seen with plaques related to multiple sclerosis (which cannot be excluded given the patient's age and gender), the fact that the patient has multiple risk factors of atherosclerosis in addition to remote bilateral thalamic infarcts suggests that the white matter type changes may be related to result of small vessel disease. Other causes of white matter type changes such as that secondary to vasculitis or inflammatory process are secondary less likely considerations.  No hydrocephalus.  No intracranial mass lesion noted on this unenhanced exam.  Exophthalmos.  Cervical medullary junction, pituitary region and pineal region unremarkable.  Opacification left ethmoid sinus air cell minimal paranasal sinus mucosal thickening otherwise noted.  MRA HEAD FINDINGS  Exam is motion degraded.  Markedly tortuous vertical cervical segment of the internal carotid artery bilaterally.  Ectatic cavernous segment right internal carotid artery. Artifact extends through the cavernous segment of the internal carotid arteries bilaterally.  Moderate narrowing distal M1 segment/ bifurcation of the middle cerebral artery bilaterally. Artifact may partially contribute to this appearance.  Moderate to marked narrowing A1 segment  anterior cerebral artery more notable on the left.  A2 segment anterior cerebral artery and middle cerebral artery distal branch vessel mild to moderate irregularity and narrowing bilaterally.  Markedly ectatic vertebral arteries with fusiform dilation on the left. Moderate to marked narrowing distal left vertebral artery. Mild to moderate narrowing right vertebral artery.  Non visualization right posterior inferior cerebellar artery. Narrowing left posterior inferior cerebellar artery.  Moderate narrowing proximal basilar artery. Basilar artery beyond this region is ectatic.  Nonvisualization anterior inferior cerebellar artery bilaterally.  Narrowing of the superior cerebellar artery bilaterally.  Long segment moderate to marked stenosis of the P2 segment of the right posterior cerebral artery. Mild to moderate narrowing P2 segment left posterior cerebral artery.  Mild to slightly moderate narrowing distal branches of the posterior cerebral artery more notable on left.  No aneurysm or vascular malformation noted.  IMPRESSION: MR brain:  Small subacute nonhemorrhagic right cerebellar infarct.  Prominent progressive white matter type changes. Although several of the white matter changes involve the periventricular region, some of which appear perpendicular as can be seen with plaques related to multiple sclerosis (which cannot be excluded given the patient's age and gender), the fact that the patient has multiple risk factors of atherosclerosis in addition to remote bilateral thalamic infarcts suggests that the white matter type changes may be related to result of small vessel disease.  MR angiogram:  Motion degraded with prominent intracranial atherosclerotic type changes as detailed above.   Electronically Signed   By: Chauncey Cruel M.D.   On: 11/17/2013 18:37   Mr Brain Wo Contrast  11/17/2013   CLINICAL DATA:  Confusion. Altered mental status. Slurred speech. Weakness since 10/30/2013. Right greater on left  upper extremity is some subtle lower extremity weakness. Recent episode of markedly elevated blood pressure. Diabetic (poorly controlled) hypertensive (poorly controlled) hyperlipidemia obese smoker.  EXAM: MRI HEAD WITHOUT CONTRAST  MRA HEAD WITHOUT CONTRAST  TECHNIQUE: Multiplanar, multiecho pulse sequences of the brain and surrounding structures were obtained  without intravenous contrast. Angiographic images of the head were obtained using MRA technique without contrast.  COMPARISON:  11/17/2013 CT.  09/24/2004 MR.  FINDINGS: MRI HEAD FINDINGS  Small subacute nonhemorrhagic right cerebellar infarct.  No intracranial hemorrhage.  Prominent progressive white matter type changes. Although several of the white matter changes involve the periventricular region, some of which appear perpendicular as can be seen with plaques related to multiple sclerosis (which cannot be excluded given the patient's age and gender), the fact that the patient has multiple risk factors of atherosclerosis in addition to remote bilateral thalamic infarcts suggests that the white matter type changes may be related to result of small vessel disease. Other causes of white matter type changes such as that secondary to vasculitis or inflammatory process are secondary less likely considerations.  No hydrocephalus.  No intracranial mass lesion noted on this unenhanced exam.  Exophthalmos.  Cervical medullary junction, pituitary region and pineal region unremarkable.  Opacification left ethmoid sinus air cell minimal paranasal sinus mucosal thickening otherwise noted.  MRA HEAD FINDINGS  Exam is motion degraded.  Markedly tortuous vertical cervical segment of the internal carotid artery bilaterally.  Ectatic cavernous segment right internal carotid artery. Artifact extends through the cavernous segment of the internal carotid arteries bilaterally.  Moderate narrowing distal M1 segment/ bifurcation of the middle cerebral artery bilaterally. Artifact  may partially contribute to this appearance.  Moderate to marked narrowing A1 segment anterior cerebral artery more notable on the left.  A2 segment anterior cerebral artery and middle cerebral artery distal branch vessel mild to moderate irregularity and narrowing bilaterally.  Markedly ectatic vertebral arteries with fusiform dilation on the left. Moderate to marked narrowing distal left vertebral artery. Mild to moderate narrowing right vertebral artery.  Non visualization right posterior inferior cerebellar artery. Narrowing left posterior inferior cerebellar artery.  Moderate narrowing proximal basilar artery. Basilar artery beyond this region is ectatic.  Nonvisualization anterior inferior cerebellar artery bilaterally.  Narrowing of the superior cerebellar artery bilaterally.  Long segment moderate to marked stenosis of the P2 segment of the right posterior cerebral artery. Mild to moderate narrowing P2 segment left posterior cerebral artery.  Mild to slightly moderate narrowing distal branches of the posterior cerebral artery more notable on left.  No aneurysm or vascular malformation noted.  IMPRESSION: MR brain:  Small subacute nonhemorrhagic right cerebellar infarct.  Prominent progressive white matter type changes. Although several of the white matter changes involve the periventricular region, some of which appear perpendicular as can be seen with plaques related to multiple sclerosis (which cannot be excluded given the patient's age and gender), the fact that the patient has multiple risk factors of atherosclerosis in addition to remote bilateral thalamic infarcts suggests that the white matter type changes may be related to result of small vessel disease.  MR angiogram:  Motion degraded with prominent intracranial atherosclerotic type changes as detailed above.   Electronically Signed   By: Chauncey Cruel M.D.   On: 11/17/2013 18:37    EKG Interpretation    Date/Time:  Friday November 17 2013  12:09:00 EST Ventricular Rate:  92 PR Interval:  124 QRS Duration: 90 QT Interval:  366 QTC Calculation: 452 R Axis:   -48 Text Interpretation:  Sinus rhythm with Premature supraventricular complexes Possible Left atrial enlargement Left axis deviation Left ventricular hypertrophy with repolarization abnormality Abnormal ECG When compared with ECG of 08-Nov-2012 18:39, Premature supraventricular complexes are now Present Confirmed by Jurnee Nakayama  MD, Ramaya Guile ([id number]) on 11/17/2013 7:05:46 PM  MDM   1. CVA (cerebral infarction)   2. Hypertension   3. Hyperglycemia    Patient's history is a bit confusing she felt as if she had upper extremity weakness right greater than left since January 5 however head CT shows an acute left thalamic infarct. Does fit the patient's symptoms. Patient also with poorly controlled blood sugars in the 300 not acidotic and poorly controlled blood pressure with the blood pressures running 172/109, 157/114. Patient was given some Catapres medication she is normally on and it did bring the diastolic down to 275. Patient will need further improvement with hypertension and the blood sugar. Discussed with the admitting team they will mid to telemetry also MR MRA of head and brain ordered.    Mervin Kung, MD 11/17/13 [place], MD 11/17/13 [phone]

## 2013-11-17 NOTE — ED Notes (Signed)
Pt tolerated meal tray well. Report called to floor.

## 2013-11-17 NOTE — ED Notes (Signed)
Pt awaiting mri, then will be transferred to the floor.  Family at bedside.

## 2013-11-18 ENCOUNTER — Inpatient Hospital Stay (HOSPITAL_COMMUNITY): Payer: PRIVATE HEALTH INSURANCE

## 2013-11-18 DIAGNOSIS — I517 Cardiomegaly: Secondary | ICD-10-CM

## 2013-11-18 DIAGNOSIS — Z9119 Patient's noncompliance with other medical treatment and regimen: Secondary | ICD-10-CM

## 2013-11-18 DIAGNOSIS — Z91199 Patient's noncompliance with other medical treatment and regimen due to unspecified reason: Secondary | ICD-10-CM

## 2013-11-18 DIAGNOSIS — IMO0002 Reserved for concepts with insufficient information to code with codable children: Secondary | ICD-10-CM

## 2013-11-18 DIAGNOSIS — F17200 Nicotine dependence, unspecified, uncomplicated: Secondary | ICD-10-CM

## 2013-11-18 DIAGNOSIS — E1065 Type 1 diabetes mellitus with hyperglycemia: Secondary | ICD-10-CM

## 2013-11-18 LAB — HEMOGLOBIN A1C
Hgb A1c MFr Bld: 11 % — ABNORMAL HIGH (ref <5.7)
Mean Plasma Glucose: 269 mg/dL — ABNORMAL HIGH (ref <117)

## 2013-11-18 LAB — LIPID PANEL
Cholesterol: 148 mg/dL (ref 0–200)
HDL: 50 mg/dL (ref >39)
LDL Cholesterol: 62 mg/dL (ref 0–99)
Total CHOL/HDL Ratio: 3 RATIO
Triglycerides: 182 mg/dL — ABNORMAL HIGH (ref <150)
VLDL: 36 mg/dL (ref 0–40)

## 2013-11-18 LAB — GLUCOSE, CAPILLARY
Glucose-Capillary: 275 mg/dL — ABNORMAL HIGH (ref 70–99)
Glucose-Capillary: 203 mg/dL — ABNORMAL HIGH (ref 70–99)
Glucose-Capillary: 365 mg/dL — ABNORMAL HIGH (ref 70–99)
Glucose-Capillary: 168 mg/dL — ABNORMAL HIGH (ref 70–99)

## 2013-11-18 MED ORDER — ASPIRIN 81 MG PO CHEW
81.0000 mg | CHEWABLE_TABLET | Freq: Every day | ORAL | Status: DC
  Administered 2013-11-19 – 2013-11-21 (×3): 81 mg via ORAL
  Filled 2013-11-18 (×3): qty 1

## 2013-11-18 MED ORDER — INSULIN ASPART 100 UNIT/ML ~~LOC~~ SOLN
0.0000 [IU] | Freq: Three times a day (TID) | SUBCUTANEOUS | Status: DC
  Administered 2013-11-18: 20 [IU] via SUBCUTANEOUS
  Administered 2013-11-19: 7 [IU] via SUBCUTANEOUS
  Administered 2013-11-19: 11 [IU] via SUBCUTANEOUS
  Administered 2013-11-19: 7 [IU] via SUBCUTANEOUS
  Administered 2013-11-20: 11 [IU] via SUBCUTANEOUS
  Administered 2013-11-20: 7 [IU] via SUBCUTANEOUS
  Administered 2013-11-20: 11 [IU] via SUBCUTANEOUS
  Administered 2013-11-21: 7 [IU] via SUBCUTANEOUS
  Administered 2013-11-21: 20 [IU] via SUBCUTANEOUS

## 2013-11-18 MED ORDER — BENAZEPRIL HCL 10 MG PO TABS
40.0000 mg | ORAL_TABLET | Freq: Every day | ORAL | Status: DC
  Administered 2013-11-19: 40 mg via ORAL
  Filled 2013-11-18: qty 4

## 2013-11-18 MED ORDER — CLOPIDOGREL BISULFATE 75 MG PO TABS
75.0000 mg | ORAL_TABLET | Freq: Every day | ORAL | Status: DC
  Administered 2013-11-19 – 2013-11-21 (×3): 75 mg via ORAL
  Filled 2013-11-18 (×3): qty 1

## 2013-11-18 MED ORDER — ACETAMINOPHEN 325 MG PO TABS
650.0000 mg | ORAL_TABLET | Freq: Four times a day (QID) | ORAL | Status: DC | PRN
  Administered 2013-11-18 – 2013-11-20 (×3): 650 mg via ORAL
  Filled 2013-11-18 (×3): qty 2

## 2013-11-18 MED ORDER — INSULIN ASPART 100 UNIT/ML ~~LOC~~ SOLN
0.0000 [IU] | Freq: Every day | SUBCUTANEOUS | Status: DC
  Administered 2013-11-19 – 2013-11-20 (×2): 3 [IU] via SUBCUTANEOUS

## 2013-11-18 MED ORDER — INSULIN GLARGINE 100 UNIT/ML ~~LOC~~ SOLN
10.0000 [IU] | Freq: Every day | SUBCUTANEOUS | Status: DC
  Administered 2013-11-18: 10 [IU] via SUBCUTANEOUS
  Filled 2013-11-18 (×2): qty 0.1

## 2013-11-18 NOTE — Progress Notes (Signed)
[facility] PROGRESS NOTE  [name] ZWC:[id number] DOB: Jul 25, 1966 DOA: 11/17/2013 PCP: Tula Nakayama, MD  Summary: 48 year old woman with history of poorly-controlled hypertension and diabetes presents to the emergency department with complaints of right-sided hand weakness. Initial evaluation suggested acute thalamic infarct and she was referred for further evaluation. Subsequent MRI revealed acute right cerebellar infarct.  Assessment/Plan: 1. Acute right cerebellar infarct. No definite deficits at this point. Complete stroke evaluation. Continue statin. Previously on aspirin. Add Plavix. 2. Hypertension. Uncontrolled as an outpatient. Control somewhat better here. 3. Diabetes mellitus type 2 uncontrolled with hemoglobin A1c 10.9. History of Charcot foot. Resume metformin on discharge. Recently started on Lantus, dosage unknown. 4. Hyperlipidemia. Continue statin. 5. Cigarette smoker. Recommend cessation. Patient desires to quit. 6. Non-compliance with medications   Complete stroke evaluation. Add Plavix.  Outpatient therapy evaluations  Increase benazepril.  Anticipate discharge 1/25  Code Status: full code DVT prophylaxis: Lovenox Family Communication: none present Disposition Plan:   Murray Hodgkins, MD  [facility]  Pager [phone] If 7PM-7AM, please contact night-coverage at [email or web address], password [facility] 11/18/2013, 1:31 PM  LOS: 1 day   Consultants:    Procedures:  2-D echocardiogram:  Antibiotics:    HPI/Subjective: She feels better. No neurologic changes. Still some clumsiness and difficulty gripping with her right hand.  Objective: Filed Vitals:   11/17/13 2300 11/18/13 0100 11/18/13 0433 11/18/13 0905  BP: 163/120 156/95 150/110 160/115  Pulse: 89 88 80 85  Temp:   98.2 F (36.8 C)   TempSrc:   Oral   Resp:   20   Height:      Weight:      SpO2:   100%     Intake/Output Summary (Last 24 hours) at 11/18/13 1331 Last  data filed at 11/18/13 0433  Gross per 24 hour  Intake    480 ml  Output   1700 ml  Net  -1220 ml     Filed Weights   11/17/13 1134 11/17/13 1945  Weight: 82.555 kg (182 lb) 83.7 kg (184 lb 8.4 oz)    Exam:   Afebrile, vital signs stable. Mildly hypertensive.  General: She appears calm and comfortable. Nontoxic.  Neurologic: Cranial nerves 2-12 appear intact. Speech fluent and clear.  Musculoskeletal: No definite right-sided weakness in the arm or hand. Grip strength appears to be symmetric compared to the left. Finger abduction and abduction intact. No dysdiadochokinesis of the bilateral upper extremities.  Cardiovascular: Regular rate and rhythm. No murmur, rub or gallop.  Respiratory: Clear to auscultation bilaterally. No wheezes, rales or rhonchi. Normal respiratory effort.  Psychiatric: Grossly normal mood and affect. Speech fluent and appropriate.  Data Reviewed:  Capillary blood sugars 200-400  LDL 62  Carotid dopplers unremarkable  Scheduled Meds: . amLODipine  10 mg Oral Daily  . aspirin EC  325 mg Oral Daily  . atorvastatin  10 mg Oral q1800  . benazepril  20 mg Oral Daily  . cloNIDine  0.3 mg Transdermal Weekly  . enoxaparin (LOVENOX) injection  40 mg Subcutaneous Q24H  . FLUoxetine  20 mg Oral Daily  . furosemide  40 mg Oral Daily  . insulin aspart  0-15 Units Subcutaneous TID WC  . insulin aspart  0-5 Units Subcutaneous QHS  . nicotine  14 mg Transdermal Daily  . pantoprazole  40 mg Oral Daily  . potassium chloride  30 mEq Oral Daily   Continuous Infusions:   Principal Problem:   CVA (cerebral infarction) Active Problems:   HYPERLIPIDEMIA  Diabetic Charcot foot   Hypertensive cardiomyopathy   CHF (congestive heart failure)   Hypertensive emergency   Hyperglycemia   Tobacco abuse   Obesity   Thalamic infarct, acute   DM (diabetes mellitus)   Time spent 20 minutes

## 2013-11-18 NOTE — Evaluation (Signed)
Physical Therapy Evaluation Patient Details Name: [name] MRN: [id number] DOB: 18-Oct-1966 Today's Date: 11/18/2013 Time: 1352-1440 PT Time Calculation (min): 48 min  PT Assessment / Plan / Recommendation History of Present Illness  48 year old female with a known medical history of uncontrolled diabetes uncontrolled hypertension and medical noncompliance presents with right-sided weakness ongoing for 4 weeks. CT yields small acute Thalamic infarct and hypertensive urgency.  Clinical Impression  Patient very pleasant , awake , alert and orientated to time, place and person .Patient demonstrated instability with standing and gait without Assistive device , increased shortness of breath noted with ambulation greater than 100 feet , patient educated and demonstrated with energy conservation technique for standing and resting and with breathing exercise to overcome short ness of breath. Patient want to go home and agreeable with continuing with Home heath services in order to improve with functional level , patient stated that she will have daughter to provide assistance as per required.    PT Assessment  Patient needs continued PT services    Follow Up Recommendations  Home health PT    Does the patient have the potential to tolerate intense rehabilitation      Barriers to Discharge        Equipment Recommendations       Recommendations for Other Services     Frequency Min 3X/week    Precautions / Restrictions Precautions Precautions: Fall   Pertinent Vitals/Pain       Mobility  Bed Mobility Overal bed mobility: Independent Transfers Overall transfer level: Modified independent Equipment used: None Ambulation/Gait Ambulation/Gait assistance: Supervision Ambulation Distance (Feet): 100 Feet Assistive device: None Gait Pattern/deviations: Decreased weight shift to right;Staggering right Gait velocity interpretation: Below normal speed for age/gender    Exercises  Total Joint Exercises Ankle Circles/Pumps: AROM;Supine;Both Heel Slides: AROM;Supine;Both Hip ABduction/ADduction: AROM;10 reps;Standing;Supine;Both Straight Leg Raises: AROM;15 reps;Supine;Both Knee Flexion: AROM;15 reps;Seated;Both Marching in Standing: AROM;Both;15 reps;Standing Bridges: AROM;15 reps;Supine General Exercises - Upper Extremity Shoulder Flexion: AROM;10 reps;Seated;Both Shoulder ABduction: AROM;Seated;Both;10 reps General Exercises - Lower Extremity Mini-Sqauts: AROM;10 reps;Standing Other Exercises Other Exercises: standing balance exercises- functional reaching , alternate stepping , static /dynamic balance exercise to challenge the balance reactions.   PT Diagnosis: Difficulty walking;Generalized weakness  PT Problem List: Decreased strength;Decreased activity tolerance;Decreased balance;Decreased mobility;Decreased coordination PT Treatment Interventions: Gait training;Functional mobility training;Therapeutic activities;Therapeutic exercise     PT Goals(Current goals can be found in the care plan section) Acute Rehab PT Goals Patient Stated Goal: want to be able to walk with increased stability , and wants to do things on her own independently.  PT Goal Formulation: With patient Time For Goal Achievement: 11/25/13 Potential to Achieve Goals: Good  Visit Information  Last PT Received On: 11/18/13 History of Present Illness: 48 year old female with a known medical history of uncontrolled diabetes uncontrolled hypertension and medical noncompliance presents with right-sided weakness ongoing for 4 weeks. CT yields small acute Thalamic infarct and hypertensive urgency.       Prior Functioning  Prior Function Level of Independence: Independent Comments: patient was I without assistive devices fro mobility.    Cognition  Cognition Arousal/Alertness: Awake/alert Behavior During Therapy: WFL for tasks assessed/performed Overall Cognitive Status: Within  Functional Limits for tasks assessed    Extremity/Trunk Assessment Lower Extremity Assessment Lower Extremity Assessment: Defer to PT evaluation   Balance Balance Overall balance assessment: Needs assistance Sitting balance-Leahy Scale: Normal Standing balance support: No upper extremity supported Standing balance-Leahy Scale: Fair Standing balance comment: patient  requires SBA to  occasional CGA  for alternate stepping without support General Comments General comments (skin integrity, edema, etc.): scar tissue on left lower leg  End of Session PT - End of Session Equipment Utilized During Treatment: Gait belt Activity Tolerance: Other (comment) (limited with Shortness of breath) Patient left: in bed;with nursing/sitter in room Nurse Communication: Mobility status  GP     Darius Bump 11/18/2013, 2:53 PM

## 2013-11-18 NOTE — Progress Notes (Signed)
Patient has good prognosis due to motivation and prior level of function

## 2013-11-18 NOTE — Evaluation (Signed)
f3Physical Therapy Evaluation Patient Details Name: [name] MRN: [id number] DOB: February 23, 1966 Today's Date: 11/18/2013 Time: 1352-1440 PT Time Calculation (min): 48 min  PT Assessment / Plan / Recommendation History of Present Illness  48 year old female with a known medical history of uncontrolled diabetes uncontrolled hypertension and medical noncompliance presents with right-sided weakness ongoing for 4 weeks. CT yields small acute Thalamic infarct and hypertensive urgency.  Clinical Impression  patient very pleasant , awake , alert and orientated to time, place and person .Patient demonstrated instability with standing and gait without Assistive device , increased shortness of breath noted with ambulation greater than 100 feet , patient educated and demonstrated with energy conservation technique for standing and resting and with breathing exercise to overcome short ness of breath. Patient want to go home and agreeable with continuing with Home heath services in order to improve with functional level , patient stated that she will have daughter to provide assistance as per required.     PT Assessment  Patient needs continued PT services    Follow Up Recommendations  Home health PT    Does the patient have the potential to tolerate intense rehabilitation      Barriers to Discharge        Equipment Recommendations       Recommendations for Other Services     Frequency Min 3X/week    Precautions / Restrictions Precautions Precautions: Fall   Pertinent Vitals/Pain       Mobility  Bed Mobility Overal bed mobility: Independent Transfers Overall transfer level: Modified independent Equipment used: None Ambulation/Gait Ambulation/Gait assistance: Supervision Ambulation Distance (Feet): 100 Feet Assistive device: None Gait Pattern/deviations: Decreased weight shift to right;Staggering right Gait velocity interpretation: Below normal speed for age/gender     Exercises Total Joint Exercises Ankle Circles/Pumps: AROM;Supine;Both Heel Slides: AROM;Supine;Both Hip ABduction/ADduction: AROM;10 reps;Standing;Supine;Both Straight Leg Raises: AROM;15 reps;Supine;Both Knee Flexion: AROM;15 reps;Seated;Both Marching in Standing: AROM;Both;15 reps;Standing Bridges: AROM;15 reps;Supine General Exercises - Upper Extremity Shoulder Flexion: AROM;10 reps;Seated;Both Shoulder ABduction: AROM;Seated;Both;10 reps General Exercises - Lower Extremity Mini-Sqauts: AROM;10 reps;Standing Other Exercises Other Exercises: standing balance exercises- functional reaching , alternate stepping , static /dynamic balance exercise to challenge the balance reactions.   PT Diagnosis: Difficulty walking;Generalized weakness  PT Problem List: Decreased strength;Decreased activity tolerance;Decreased balance;Decreased mobility;Decreased coordination PT Treatment Interventions: Gait training;Functional mobility training;Therapeutic activities;Therapeutic exercise     PT Goals(Current goals can be found in the care plan section) Acute Rehab PT Goals Patient Stated Goal: want to be able to walk with increased stability , and wants to do things on her own independently.  PT Goal Formulation: With patient Time For Goal Achievement: 11/25/13 Potential to Achieve Goals: Good  Visit Information  Last PT Received On: 11/18/13 History of Present Illness: 48 year old female with a known medical history of uncontrolled diabetes uncontrolled hypertension and medical noncompliance presents with right-sided weakness ongoing for 4 weeks. CT yields small acute Thalamic infarct and hypertensive urgency.       Prior Functioning  Prior Function Level of Independence: Independent Comments: patient was I without assistive devices fro mobility.    Cognition  Cognition Arousal/Alertness: Awake/alert Behavior During Therapy: WFL for tasks assessed/performed Overall Cognitive Status:  Within Functional Limits for tasks assessed    Extremity/Trunk Assessment Lower Extremity Assessment Lower Extremity Assessment: Defer to PT evaluation   Balance Balance Overall balance assessment: Needs assistance Sitting balance-Leahy Scale: Normal Standing balance support: No upper extremity supported Standing balance-Leahy Scale: Fair Standing balance comment: patient  requires SBA  to occasional CGA  for alternate stepping without support General Comments General comments (skin integrity, edema, etc.): scar tissue on left lower leg  End of Session PT - End of Session Equipment Utilized During Treatment: Gait belt Activity Tolerance: Other (comment) (limited with Shortness of breath) Patient left: in bed;with nursing/sitter in room Nurse Communication: Mobility status  GP    Darius Bump 11/18/2013, 3:02 PM

## 2013-11-18 NOTE — Progress Notes (Signed)
  Echocardiogram 2D Echocardiogram has been performed.  [name] M 11/18/2013, 12:29 PM

## 2013-11-18 NOTE — Evaluation (Signed)
Physical Therapy Evaluation Patient Details Name: [name] MRN: [id number] DOB: [birth year] Today's Date: 11/18/2013 Time: 1352-1440 PT Time Calculation (min): 48 min  PT Assessment / Plan / Recommendation History of Present Illness  48 year old female with a known medical history of uncontrolled diabetes uncontrolled hypertension and medical noncompliance presents with right-sided weakness ongoing for 4 weeks. CT yields small acute Thalamic infarct and hypertensive urgency.  Clinical Impression  patient very pleasant , awake , alert and orientated to time, place and person .Patient demonstrated instability with standing and gait without Assistive device , increased shortness of breath noted with ambulation greater than 100 feet , patient educated and demonstrated with energy conservation technique for standing and resting and with breathing exercise to overcome short ness of breath. Patient want to go home and agreeable with continuing with Home heath services in order to improve with functional level , patient stated that she will have daughter to provide assistance as per required.     PT Assessment  Patient needs continued PT services    Follow Up Recommendations  Home health PT    Does the patient have the potential to tolerate intense rehabilitation      Barriers to Discharge        Equipment Recommendations       Recommendations for Other Services     Frequency Min 3X/week    Precautions / Restrictions Precautions Precautions: Fall   Pertinent Vitals/Pain       Mobility  Bed Mobility Overal bed mobility: Independent Transfers Overall transfer level: Modified independent Equipment used: None Ambulation/Gait Ambulation/Gait assistance: Supervision Ambulation Distance (Feet): 100 Feet Assistive device: None Gait Pattern/deviations: Decreased weight shift to right;Staggering right Gait velocity interpretation: Below normal speed for age/gender    Exercises  Total Joint Exercises Ankle Circles/Pumps: AROM;Supine;Both Heel Slides: AROM;Supine;Both Hip ABduction/ADduction: AROM;10 reps;Standing;Supine;Both Straight Leg Raises: AROM;15 reps;Supine;Both Knee Flexion: AROM;15 reps;Seated;Both Marching in Standing: AROM;Both;15 reps;Standing Bridges: AROM;15 reps;Supine General Exercises - Upper Extremity Shoulder Flexion: AROM;10 reps;Seated;Both Shoulder ABduction: AROM;Seated;Both;10 reps General Exercises - Lower Extremity Mini-Sqauts: AROM;10 reps;Standing Other Exercises Other Exercises: standing balance exercises- functional reaching , alternate stepping , static /dynamic balance exercise to challenge the balance reactions.   PT Diagnosis: Difficulty walking;Generalized weakness  PT Problem List: Decreased strength;Decreased activity tolerance;Decreased balance;Decreased mobility;Decreased coordination PT Treatment Interventions: Gait training;Functional mobility training;Therapeutic activities;Therapeutic exercise     PT Goals(Current goals can be found in the care plan section) Acute Rehab PT Goals Patient Stated Goal: want to be able to walk with increased stability , and wants to do things on her own independently.  PT Goal Formulation: With patient Time For Goal Achievement: 11/25/13 Potential to Achieve Goals: Good  Visit Information  Last PT Received On: 11/18/13 History of Present Illness: 48 year old female with a known medical history of uncontrolled diabetes uncontrolled hypertension and medical noncompliance presents with right-sided weakness ongoing for 4 weeks. CT yields small acute Thalamic infarct and hypertensive urgency.       Prior Functioning  Prior Function Level of Independence: Independent Comments: patient was I without assistive devices fro mobility.    Cognition  Cognition Arousal/Alertness: Awake/alert Behavior During Therapy: WFL for tasks assessed/performed Overall Cognitive Status: Within  Functional Limits for tasks assessed    Extremity/Trunk Assessment Lower Extremity Assessment Lower Extremity Assessment: Defer to PT evaluation   Balance Balance Overall balance assessment: Needs assistance Sitting balance-Leahy Scale: Normal Standing balance support: No upper extremity supported Standing balance-Leahy Scale: Fair Standing balance comment: patient  requires SBA  to occasional CGA  for alternate stepping without support General Comments General comments (skin integrity, edema, etc.): scar tissue on left lower leg  End of Session PT - End of Session Equipment Utilized During Treatment: Gait belt Activity Tolerance: Other (comment) (limited with Shortness of breath) Patient left: in bed;with nursing/sitter in room Nurse Communication: Mobility status  GP     Darius Bump 11/18/2013, 3:11 PM

## 2013-11-18 NOTE — Evaluation (Signed)
Physical Therapy Evaluation Patient Details Name: [name] MRN: [id number] DOB: 06-Sep-1966 Today's Date: 11/18/2013 Time:  -     PT Assessment / Plan / Recommendation History of Present Illness  48 year old female with a known medical history of uncontrolled diabetes uncontrolled hypertension and medical noncompliance presents with right-sided weakness ongoing for 4 weeks. CT yields small acute Thalamic infarct and hypertensive urgency.  Clinical Impression  Patient very pleasant, oriented to time, place and person , alert , aware about instability in gait due to weakness, I with bed mobility and transfers, Supervision for gait without assistive device, complains of shortness of breath with ambulation greater than 100 feet today , educated and demonstrated patient with energy conservation technique and breathing exercises to over come SOB, patient plans to go home and continue with home health services, patient stated will have assistance from the daughter as per required.Patient demonstrated instability with gait and standing without Assistive device.    PT Assessment  Patient needs continued PT services    Follow Up Recommendations  Home health PT    Does the patient have the potential to tolerate intense rehabilitation      Barriers to Discharge        Equipment Recommendations       Recommendations for Other Services     Frequency Min 3X/week    Precautions / Restrictions Precautions Precautions: Fall   Pertinent Vitals/Pain       Mobility  Bed Mobility Overal bed mobility: Independent Transfers Overall transfer level: Modified independent Equipment used: None Ambulation/Gait Ambulation/Gait assistance: Supervision Ambulation Distance (Feet): 100 Feet Assistive device: None Gait Pattern/deviations: Decreased stride length    Exercises Total Joint Exercises Ankle Circles/Pumps: AROM;Supine;Both Heel Slides: AROM;Supine;Both Hip ABduction/ADduction:  AROM;10 reps;Standing;Supine;Both Straight Leg Raises: AROM;15 reps;Supine;Both Knee Flexion: AROM;15 reps;Seated;Both Marching in Standing: AROM;Both;15 reps;Standing Bridges: AROM;15 reps;Supine General Exercises - Upper Extremity Shoulder Flexion: AROM;10 reps;Seated;Both Shoulder ABduction: AROM;Seated;Both;10 reps General Exercises - Lower Extremity Mini-Sqauts: AROM;10 reps;Standing   PT Diagnosis: Difficulty walking;Generalized weakness  PT Problem List: Decreased strength;Decreased activity tolerance;Decreased balance;Decreased mobility;Decreased coordination PT Treatment Interventions: Gait training;Functional mobility training;Therapeutic activities;Therapeutic exercise     PT Goals(Current goals can be found in the care plan section) Acute Rehab PT Goals Patient Stated Goal: want to be able to walk with increased stability , and wants to do things on her own independently.  PT Goal Formulation: With patient Time For Goal Achievement: 11/25/13 Potential to Achieve Goals: Good  Visit Information  Last PT Received On: 11/18/13 History of Present Illness: 48 year old female with a known medical history of uncontrolled diabetes uncontrolled hypertension and medical noncompliance presents with right-sided weakness ongoing for 4 weeks. CT yields small acute Thalamic infarct and hypertensive urgency.       Prior Functioning  Prior Function Level of Independence: Independent Comments: patient was I without assistive devices fro mobility.    Cognition  Cognition Arousal/Alertness: Awake/alert Behavior During Therapy: WFL for tasks assessed/performed Overall Cognitive Status: Within Functional Limits for tasks assessed    Extremity/Trunk Assessment Lower Extremity Assessment Lower Extremity Assessment: Defer to PT evaluation   Balance Balance Overall balance assessment: Modified Independent General Comments General comments (skin integrity, edema, etc.): scar tissue on  left lower leg  End of Session PT - End of Session Equipment Utilized During Treatment: Gait belt Activity Tolerance: Other (comment) (limited with Shortness of breath) Patient left: in bed;with nursing/sitter in room Nurse Communication: Mobility status  GP     Darius Bump 11/18/2013, 2:36  PM   

## 2013-11-19 DIAGNOSIS — I10 Essential (primary) hypertension: Secondary | ICD-10-CM

## 2013-11-19 LAB — GLUCOSE, CAPILLARY
Glucose-Capillary: 304 mg/dL — ABNORMAL HIGH (ref 70–99)
Glucose-Capillary: 221 mg/dL — ABNORMAL HIGH (ref 70–99)
Glucose-Capillary: 246 mg/dL — ABNORMAL HIGH (ref 70–99)
Glucose-Capillary: 262 mg/dL — ABNORMAL HIGH (ref 70–99)

## 2013-11-19 MED ORDER — BENAZEPRIL HCL 10 MG PO TABS
20.0000 mg | ORAL_TABLET | Freq: Every day | ORAL | Status: DC
  Administered 2013-11-20 – 2013-11-21 (×2): 20 mg via ORAL
  Filled 2013-11-19 (×2): qty 2

## 2013-11-19 MED ORDER — METOPROLOL TARTRATE 25 MG PO TABS
12.5000 mg | ORAL_TABLET | Freq: Two times a day (BID) | ORAL | Status: DC
  Administered 2013-11-19 – 2013-11-21 (×4): 12.5 mg via ORAL
  Filled 2013-11-19 (×4): qty 1

## 2013-11-19 MED ORDER — INSULIN GLARGINE 100 UNIT/ML ~~LOC~~ SOLN
15.0000 [IU] | Freq: Every day | SUBCUTANEOUS | Status: DC
  Administered 2013-11-19: 15 [IU] via SUBCUTANEOUS
  Filled 2013-11-19: qty 0.15

## 2013-11-19 NOTE — Progress Notes (Signed)
[facility] PROGRESS NOTE  [name] WCH:[id number] DOB: 1966-07-04 DOA: 11/17/2013 PCP: Tula Nakayama, MD  Summary: 48 year old woman with history of poorly-controlled hypertension and diabetes presents to the emergency department with complaints of right-sided hand weakness. Initial evaluation suggested acute thalamic infarct and she was referred for further evaluation. Subsequent MRI revealed acute right cerebellar infarct.  Assessment/Plan: 1. Acute right cerebellar infarct. No definite deficits at this point. Complete stroke evaluation. Continue statin. Previously on aspirin. Add Plavix. 2. Hypertension. Uncontrolled as an outpatient. Control somewhat better here. 3. Transient SVT. Occurs with exertion. TSH normal 10/30/2013. 4. Diabetes mellitus type 2 uncontrolled with hemoglobin A1c 10.9. History of Charcot foot. Resume metformin on discharge. Recently started on Lantus, dosage unknown. 5. Hyperlipidemia. Continue statin. 6. Cigarette smoker. Recommend cessation. Patient desires to quit. 7. Non-compliance with medications   Stroke workup complete. Plavix added to aspirin. Continue statin. Optimize blood pressure control glycemic control as an outpatient.  Add beta blocker. Check electrolytes in the morning.  Complete therapy evaluations as outpatient.  Stop smoking.  Home 1/26  Code Status: full code DVT prophylaxis: Lovenox Family Communication: none present Disposition Plan:   Murray Hodgkins, MD  [facility]  Pager [phone] If 7PM-7AM, please contact night-coverage at [email or web address], password [facility] 11/19/2013, 4:48 PM  LOS: 2 days   Consultants:  Physical therapy: Home health PT recommended.  Procedures:  2-D echocardiogram: Left ventricular ejection fraction 60-65%. Normal wall motion. Grade 1 diastolic dysfunction.  Antibiotics:    HPI/Subjective: Overall she is feeling better. No neurologic issues. Ambulating without difficulty. Still  some difficulty with her right hand.  Objective: Filed Vitals:   11/18/13 1413 11/18/13 1446 11/18/13 1959 11/19/13 0407  BP: 148/98 144/105 166/119 142/108  Pulse:  89 97 91  Temp:  98.1 F (36.7 C) 98.4 F (36.9 C) 98.1 F (36.7 C)  TempSrc:   Oral Oral  Resp:  18 18 18   Height:      Weight:      SpO2:  100% 100% 99%    Intake/Output Summary (Last 24 hours) at 11/19/13 1648 Last data filed at 11/19/13 1200  Gross per 24 hour  Intake    942 ml  Output      0 ml  Net    942 ml     Filed Weights   11/17/13 1134 11/17/13 1945  Weight: 82.555 kg (182 lb) 83.7 kg (184 lb 8.4 oz)    Exam:   Afebrile, vital signs stable. Mildly hypertensive. Overall blood pressure control is better.  General: She appears comfortable, calm.  Cardiovascular regular rate and rhythm. No murmur, rub or gallop.  Telemetry sinus rhythm, brief run of SVT   respiratory clear to auscultation bilaterally. No wheezes, rales or rhonchi.  Abdomen soft  Musculoskeletal--ambulates around the room without difficulty.  Neurologic: No new changes. Excellent right upper extremity strength. No pronator drift. Mild difficulty with finger-nose on the right.  Psychiatric grossly normal mood and affect. Speech fluent and appropriate.  Data Reviewed:  Capillary blood sugars 200-300  Scheduled Meds: . amLODipine  10 mg Oral Daily  . aspirin  81 mg Oral Daily  . atorvastatin  10 mg Oral q1800  . benazepril  40 mg Oral Daily  . cloNIDine  0.3 mg Transdermal Weekly  . clopidogrel  75 mg Oral Q breakfast  . enoxaparin (LOVENOX) injection  40 mg Subcutaneous Q24H  . FLUoxetine  20 mg Oral Daily  . furosemide  40 mg Oral Daily  . insulin  aspart  0-20 Units Subcutaneous TID WC  . insulin aspart  0-5 Units Subcutaneous QHS  . insulin glargine  10 Units Subcutaneous QHS  . nicotine  14 mg Transdermal Daily  . pantoprazole  40 mg Oral Daily  . potassium chloride  30 mEq Oral Daily   Continuous  Infusions:   Principal Problem:   CVA (cerebral infarction) Active Problems:   HYPERLIPIDEMIA   Diabetic Charcot foot   Hypertensive cardiomyopathy   CHF (congestive heart failure)   Hypertensive emergency   Hyperglycemia   Tobacco abuse   Obesity   Thalamic infarct, acute   DM (diabetes mellitus)   Time 25 minutes.

## 2013-11-20 LAB — CBC
HCT: 45.7 % (ref 36.0–46.0)
Hemoglobin: 15.9 g/dL — ABNORMAL HIGH (ref 12.0–15.0)
MCH: 32.9 pg (ref 26.0–34.0)
MCHC: 34.8 g/dL (ref 30.0–36.0)
MCV: 94.4 fL (ref 78.0–100.0)
Platelets: 189 10*3/uL (ref 150–400)
RBC: 4.84 MIL/uL (ref 3.87–5.11)
RDW: 12.7 % (ref 11.5–15.5)
WBC: 8.3 10*3/uL (ref 4.0–10.5)

## 2013-11-20 LAB — GLUCOSE, CAPILLARY
Glucose-Capillary: 254 mg/dL — ABNORMAL HIGH (ref 70–99)
Glucose-Capillary: 260 mg/dL — ABNORMAL HIGH (ref 70–99)
Glucose-Capillary: 245 mg/dL — ABNORMAL HIGH (ref 70–99)
Glucose-Capillary: 257 mg/dL — ABNORMAL HIGH (ref 70–99)

## 2013-11-20 LAB — BASIC METABOLIC PANEL
BUN: 23 mg/dL (ref 6–23)
CO2: 25 mEq/L (ref 19–32)
Calcium: 9.5 mg/dL (ref 8.4–10.5)
Chloride: 101 mEq/L (ref 96–112)
Creatinine, Ser: 0.93 mg/dL (ref 0.50–1.10)
GFR calc Af Amer: 82 mL/min — ABNORMAL LOW (ref >90)
GFR calc non Af Amer: 71 mL/min — ABNORMAL LOW (ref >90)
Glucose, Bld: 279 mg/dL — ABNORMAL HIGH (ref 70–99)
Potassium: 3.9 mEq/L (ref 3.7–5.3)
Sodium: 138 mEq/L (ref 137–147)

## 2013-11-20 LAB — MAGNESIUM: Magnesium: 1.6 mg/dL (ref 1.5–2.5)

## 2013-11-20 MED ORDER — INSULIN GLARGINE 100 UNIT/ML ~~LOC~~ SOLN
20.0000 [IU] | Freq: Every day | SUBCUTANEOUS | Status: DC
  Administered 2013-11-20: 20 [IU] via SUBCUTANEOUS
  Filled 2013-11-20: qty 0.2

## 2013-11-20 MED ORDER — LIVING WELL WITH DIABETES BOOK
Freq: Once | Status: AC
  Administered 2013-11-20: 09:00:00
  Filled 2013-11-20: qty 1

## 2013-11-20 NOTE — Progress Notes (Signed)
Worked with patient on teaching her how to check her blood sugar and how to use the sliding scale. Pt did well with checking her blood sugar and understands how the sliding scale works and was able to verbalize correct number of units she needed based on her CBG level. Pt had difficulty pulling up the correct amount of insulin as she has lost some of her fine motor skills in her dominant hand. Pt stated that she has a flex pen at home that she uses to administer lantus. MD notified will continue to monitor.

## 2013-11-20 NOTE — Plan of Care (Signed)
Problem: Food- and Nutrition-Related Knowledge Deficit (NB-1.1) Goal: Nutrition education Formal process to instruct or train a patient/client in a skill or to impart knowledge to help patients/clients voluntarily manage or modify food choices and eating behavior to maintain or improve health. Outcome: Adequate for Discharge RD provided CHO counting handout and pt also has received [employer]'s booklet "Living Well with Diabetes" which is very comprehensive. The outpatient RD has also seen the pt and will be contacting her after discharge to schedule an appointment.

## 2013-11-20 NOTE — Progress Notes (Addendum)
Patient's IV access out.  Patient requested IV not to be restarted.  Notified mid-level on call about loss of access, and he said he would prefer iv restarted for emergency situations.  Discussed the patient the need for iv access in case of emergency.  Patient verbalized understanding but still did not iv restarted.  Patient stated she should be discharged in am.  Iv left out per patient request.

## 2013-11-20 NOTE — Progress Notes (Signed)
Utilization Review Complete  

## 2013-11-20 NOTE — Evaluation (Signed)
Occupational Therapy Evaluation and Discharge Patient Details Name: [name] MRN: [id number] DOB: 01-05-1966 Today's Date: 11/20/2013 Time: 9147-8295 OT Time Calculation (min): 27 min Eval 1320-1337 (61') Self-Care 6213-0865 (10')  OT Assessment / Plan / Recommendation History of present illness Pt reports feeling very well and is anxious to get home.   Clinical Impression   Pt is 48 year old female with recent CVA with associated right sided weakness for the past 4 weeks. She was independent in ADLs prior to admission and has no concerns about ADL tasks after d/c. Educated pt on hand strengthening exercises and activities. Pt demonstrated and verbalized good understanding.  Pt requires no further skilled OT services at this time - acute OT will sign off.    OT Assessment  Patient does not need any further OT services    Follow Up Recommendations  No OT follow up       Equipment Recommendations  None recommended by OT          Precautions / Restrictions Precautions Precautions: None Restrictions Weight Bearing Restrictions: No   Pertinent Vitals/Pain Pt denied pain.    ADL  Grooming: Brushing hair;Independent Where Assessed - Grooming: Unsupported sitting Lower Body Dressing: Independent Where Assessed - Lower Body Dressing: Unsupported sitting Toilet Transfer: Independent Toilet Transfer Method: Sit to stand ADL Comments: Pt states independence with prior ADLs and does not have concerns about ADLs upon d/c      OT Goals(Current goals can be found in the care plan section) Acute Rehab OT Goals Patient Stated Goal: "I'll keep working - I want to be able to do for myself." OT Goal Formulation: With patient  Visit Information  Last OT Received On: 11/20/13 History of Present Illness: Pt reports feeling very well and is anxious to get home.       Prior [place] expects to be discharged to:: Private residence Living  Arrangements: Spouse/significant other (Boyfriend, Fritz Pickerel) Available Help at Discharge: Family (Boyfriend, Daughter) Home Layout: Two level (Living area on ground floor) Home Equipment: Bedside commode;Shower seat;Hand held shower head Additional Comments: Pt states independence in ADLs, but also suggested that her daughter assists when needed. Prior Function Level of Independence: Independent Comments: At baseline, pt takes seated showers with hand held showerhead, and uses raised toilet seat. Communication Communication: No difficulties Dominant Hand: Right     Vision/Perception Vision - History Baseline Vision: Wears glasses only for reading   Cognition  Cognition Arousal/Alertness: Awake/alert Behavior During Therapy: WFL for tasks assessed/performed Overall Cognitive Status: Within Functional Limits for tasks assessed    Extremity/Trunk Assessment Upper Extremity Assessment Upper Extremity Assessment: Overall WFL for tasks assessed;RUE deficits/detail RUE Deficits / Details: Decreased grip strength, numbness along fingertips of digits 2-5 RUE Sensation: decreased light touch RUE Coordination: decreased fine motor (dificulty with holding pencil) Lower Extremity Assessment Lower Extremity Assessment: Defer to PT evaluation     Mobility Bed Mobility Overal bed mobility: Independent Transfers Overall transfer level: Independent Equipment used: None      Balance Balance Sitting-balance support: Feet supported Sitting balance-Leahy Scale: Normal   End of Session OT - End of Session Activity Tolerance: Patient tolerated treatment well Patient left: in bed  GO    Bea Graff, [place], OTR/L [phone]  11/20/2013, 3:51 PM

## 2013-11-20 NOTE — Care Management Note (Addendum)
    Page 1 of 2   11/21/2013     10:37:20 AM   CARE MANAGEMENT NOTE 11/21/2013  Patient:  [name], [name]   Account Number:  [id number]  Date Initiated:  11/20/2013  Documentation initiated by:  [name]  Subjective/Objective Assessment:   pt admitted from home where she lives with a friend. [facility]ing [facility] for PT and RN. Pt has a glucose meter with her in her room and does not know how to use it. pt states she will try to give herself insulin injections     Action/Plan:   RN [name] will assist with Glucose meter and instructions on insulin injections   Anticipated DC Date:  11/21/2013   Anticipated DC Plan:  [name]  CM consult      Choice offered to / List presented to:          [name] arranged  HH-1 RN  [place].   Status of service:  Completed, signed off Medicare Important Message given?   (If response is "NO", the following Medicare IM given date fields will be blank) Date Medicare IM given:   Date Additional Medicare IM given:    Discharge Disposition:  [place]  Per UR Regulation:    If discussed at Long Length of Stay Meetings, dates discussed:    Comments:  11/21/13 Barbara Cooper RN BSN CM Per PT, no HH PT needed. [place] RN needed for compliance of insulin injections and glucose meter.  11/20/13 Barbara Cooper RN BSN CM Pt working with staff regarding insulin administration and BGM reading. [place] notified that pt selected them for [facility] RN and PT.

## 2013-11-20 NOTE — Progress Notes (Signed)
Spoke to pt about the possibility of going home on insulin. Pt is open to learning how to use insulin per sliding scale but states "that she can not give shots to herself" she states that she has a boyfriend who can help her but he may not be reliable to help her throughout every day. She says she is willing to also learn how to check her blood sugar with the CBG machine. Pt has seen the educational videos 501 and 502 at this time. Will turn on more videos as the day progresses. Will continue to monitor.

## 2013-11-20 NOTE — Progress Notes (Signed)
Physical Therapy Treatment Patient Details Name: [name] MRN: [id number] DOB: 04/03/1966 Today's Date: 11/20/2013 Time: 1442-1500 PT Time Calculation (min): 18 min  PT Assessment / Plan / Recommendation  History of Present Illness Pt reports feeling very well and is anxious to get home.   PT Comments   Pt was seen for evaluation of balance/gait.  She has returned to prior functional status with no abnormalities of strength or balance noted.  Follow Up Recommendations  No PT follow up     Does the patient have the potential to tolerate intense rehabilitation     Barriers to Discharge        Equipment Recommendations  None recommended by PT    Recommendations for Other Services    Frequency     Progress towards PT Goals Progress towards PT goals: Goals met/education completed, patient discharged from PT  Plan Discharge plan needs to be updated    Precautions / Restrictions Precautions Precautions: None Restrictions Weight Bearing Restrictions: No   Pertinent Vitals/Pain     Mobility  Bed Mobility Overal bed mobility: Independent Transfers Overall transfer level: Independent Equipment used: None Ambulation/Gait Ambulation/Gait assistance: Independent Ambulation Distance (Feet): 150 Feet Assistive device: None Gait Pattern/deviations: WFL(Within Functional Limits) Gait velocity interpretation: at or above normal speed for age/gender    Exercises     PT Diagnosis:    PT Problem List:   PT Treatment Interventions:     PT Goals (current goals can now be found in the care plan section) Acute Rehab PT Goals Patient Stated Goal: "I'll keep working - I want to be able to do for myself."  Visit Information  Last PT Received On: 11/20/13 History of Present Illness: Pt reports feeling very well and is anxious to get home.    Subjective Data  Patient Stated Goal: "I'll keep working - I want to be able to do for myself."   Cognition   Cognition Arousal/Alertness: Awake/alert Behavior During Therapy: WFL for tasks assessed/performed Overall Cognitive Status: Within Functional Limits for tasks assessed    Balance  Balance Sitting-balance support: Feet supported Sitting balance-Leahy Scale: Normal Standing balance-Leahy Scale: Normal  End of Session PT - End of Session Equipment Utilized During Treatment: Gait belt Activity Tolerance: Patient tolerated treatment well Patient left: in bed   GP     Demetrios Isaacs L 11/20/2013, 3:01 PM

## 2013-11-20 NOTE — Progress Notes (Signed)
Inpatient Diabetes Program Recommendations  AACE/ADA: New Consensus Statement on Inpatient Glycemic Control (2013)  Target Ranges:  Prepandial:   less than 140 mg/dL      Peak postprandial:   less than 180 mg/dL (1-2 hours)      Critically ill patients:  140 - 180 mg/dL  Results for [name], [name] (MRN [id number]) as of 11/20/2013 07:40  Ref. Range 11/17/2013 12:16  Hemoglobin A1C Latest Range: <5.7 % 11.0 (H)   Results for [name], [name] (MRN [id number]) as of 11/20/2013 07:40  Ref. Range 11/19/2013 07:25 11/19/2013 11:22 11/19/2013 16:23 11/19/2013 20:32 11/20/2013 07:52  Glucose-Capillary Latest Range: 70-99 mg/dL 304 (H) 221 (H) 246 (H) 262 (H) 254 (H)   Diabetes history: DM2 Outpatient Diabetes medications: Metformin 1000 mg BID Current orders for Inpatient glycemic control: Lantus 15 units QHS, Novolog 0-20 units TID with meals, Novolog 0-5 units HS  Inpatient Diabetes Program Recommendations Insulin - Basal: Please consider increasing Lantus to 20 units QHS. Outpatient Diabetes Medications: Noted A1C of 11.0 on 11/17/13. Patient will likely need to be discharged on insulin.  Please re-evalute discharge medications for diabetes control.  Note: MD-Please inform NURSING if patient will discharge on insulin so patient can be educated prior to discharge.  NURSING: If patient will discharge on insulin, please have patient watch patient education diabetes videos and educate on insulin administration.    Thanks, Barnie Alderman, RN, MSN, CCRN Diabetes Coordinator Inpatient Diabetes Program [phone] (Team Pager) [phone] ([facility] office) [phone] [facility] office)

## 2013-11-20 NOTE — Progress Notes (Signed)
Patient seen, independently examined and chart reviewed. I agree with exam, assessment and plan discussed with Dyanne Carrel, NP.  Right hand weakness without change. Overall she is doing well. She does report blood with bowel movement this morning, this was not witnessed by nursing. No pain.  She remains afebrile, hypertensive. Vitals otherwise stable. She appears calm and comfortable. No new neurologic changes. Telemetry shows sinus rhythm, no SVT. Lungs are clear.  Capillary blood sugars in the 200s. Basic metabolic panel, magnesium unremarkable.  She seems to be doing well in regard to her right cerebellar infarct. Continue aspirin, Plavix. Her hypertension remains somewhat labile, this is a long-standing problem secondary to medication noncompliance. She should continue Lasix, clonidine, Lotensin. Beta blocker has been added. Titrate medications over the next several weeks to obtain optimal control. She is currently asymptomatic. In regard to her diabetes she showed me her Lantus pen yesterday and informed me she knew how to take insulin at all her supplies at home. Apparently she has reported otherwise to nursing staff and NP. She will be provided education. Compliance will likely be an issue ongoing. Regard to bright red blood per rectum, the significance of this is unclear she does have a history of hemorrhoids. This was not witnessed. Unless there is recurrence would not suggest further evaluation. If she has further bleeding may need to involve GI.  Would anticipate discharge on 1/27 is stable.  Murray Hodgkins, MD [facility] [phone]

## 2013-11-20 NOTE — Progress Notes (Signed)
[facility] PROGRESS NOTE  [name] BHA:[id number] DOB: 12/19/1965 DOA: 11/17/2013 PCP: Tula Nakayama, MD  Assessment/Plan: CVA (cerebral infarction):Acute right cerebellar infarct. No definite deficits. Likely related to long history of uncontrolled diabetes and uncontrolled hypertension secondary to medical noncompliance. Echo with EF 60-65% and normal wall motion and grade 1 diastolic dysfunction. Lipid panel with triglyceride 182 otherwise within the limits of normal.hemoglobin A1c 11.0.  continued 81 mg of aspirin and Plavix and BB added. Continue her Crestor   Active Problems:  HTN malignant: Home medications include amlodipine Lotensin clonidine patch as well as clonidine tablet, Lasix. Have continued lasix, clonidine patch and lotenisin. Added BB. Only fair control. Will need close OP follow up.   Transient SVT. Occurred with exertion. No further episodes on tele. TSH within the limits of normal.   Diabetes mellitus type 2: Uncontrolled. Lantus increased to 20units. Pt reports Dr Dorris Fetch has provided "pens" but she has not used. Will provide education and instruction. Continue sliding scale insulin for optimal glycemic control. Will provide carb modified diet. Will hold metformin for now.   BRBPR: reported but not witnessed. No s/sx active bleeding. Concern given started on Plavix. Doubt GI bleed. Will check cbc in am. monitor   Tobacco use: counseled regarding cessation.   Obesity: BMI 34.5. Nutritional consult   Anxiety: chart review indicates recently weaned off of benzos. Appears at baseline. Will continue home med.     Code Status: full Family Communication: none present Disposition Plan: home in am   Consultants:  none  Procedures: 2-D echocardiogram: Left ventricular ejection fraction 60-65%. Normal wall motion. Grade 1 diastolic dysfunction   Antibiotics:  none  HPI/Subjective: Ambulating in room. Denies pain/discomfort. States "i feel  weak"  Objective: Filed Vitals:   11/20/13 0556  BP: 167/130  Pulse: 80  Temp: 97.8 F (36.6 C)  Resp: 20    Intake/Output Summary (Last 24 hours) at 11/20/13 1058 Last data filed at 11/19/13 1700  Gross per 24 hour  Intake    480 ml  Output      0 ml  Net    480 ml   Filed Weights   11/17/13 1134 11/17/13 1945  Weight: 82.555 kg (182 lb) 83.7 kg (184 lb 8.4 oz)    Exam:   General:  Obese calm appears comfortable  Cardiovascular: RRR No MGR No LE edema  Respiratory: normal effort BS clear bilaterally to auscultation no wheeze  Abdomen: obese soft +BS non-tender to palpation  Musculoskeletal: bilateral  Charcot foot. Fair muscle tone  Data Reviewed: Basic Metabolic Panel:  Recent Labs Lab 11/17/13 1216 11/20/13 0620  NA 139 138  K 3.6* 3.9  CL 100 101  CO2 26 25  GLUCOSE 365* 279*  BUN 11 23  CREATININE 0.80 0.93  CALCIUM 9.5 9.5  MG  --  1.6   Liver Function Tests:  Recent Labs Lab 11/17/13 1216  AST 13  ALT 14  ALKPHOS 96  BILITOT 0.6  PROT 6.9  ALBUMIN 3.1*   No results found for this basename: LIPASE, AMYLASE,  in the last 168 hours No results found for this basename: AMMONIA,  in the last 168 hours CBC:  Recent Labs Lab 11/17/13 1216  WBC 7.5  NEUTROABS 5.4  HGB 16.2*  HCT 46.2*  MCV 92.8  PLT 202   Cardiac Enzymes:  Recent Labs Lab 11/17/13 1216  TROPONINI <0.30   BNP (last 3 results)  Recent Labs  11/17/13 1354  PROBNP 506.2*   CBG:  Recent Labs Lab 11/19/13 0725 11/19/13 1122 11/19/13 1623 11/19/13 2032 11/20/13 0752  GLUCAP 304* 221* 246* 262* 254*    No results found for this or any previous visit (from the past 240 hour(s)).   Studies: No results found.  Scheduled Meds: . amLODipine  10 mg Oral Daily  . aspirin  81 mg Oral Daily  . atorvastatin  10 mg Oral q1800  . benazepril  20 mg Oral Daily  . cloNIDine  0.3 mg Transdermal Weekly  . clopidogrel  75 mg Oral Q breakfast  . enoxaparin  (LOVENOX) injection  40 mg Subcutaneous Q24H  . FLUoxetine  20 mg Oral Daily  . furosemide  40 mg Oral Daily  . insulin aspart  0-20 Units Subcutaneous TID WC  . insulin aspart  0-5 Units Subcutaneous QHS  . insulin glargine  15 Units Subcutaneous QHS  . metoprolol tartrate  12.5 mg Oral BID  . nicotine  14 mg Transdermal Daily  . pantoprazole  40 mg Oral Daily  . potassium chloride  30 mEq Oral Daily   Continuous Infusions:   Principal Problem:   CVA (cerebral infarction) Active Problems:   HYPERLIPIDEMIA   Diabetic Charcot foot   HTN (hypertension), malignant   Hypertensive cardiomyopathy   CHF (congestive heart failure)   Hypertensive emergency   Hyperglycemia   Tobacco abuse   Obesity   Thalamic infarct, acute   DM (diabetes mellitus)    Time spent: 35 minutes    [place] [facility] Pager [phone]. If 7PM-7AM, please contact night-coverage at [email or web address], password [facility] 11/20/2013, 10:58 AM  LOS: 3 days

## 2013-11-20 NOTE — Clinical Documentation Improvement (Signed)
  Please clarify type of hypertension. Thank you.   Possible Clinical Conditions?   Accelerated Hypertension Malignant Hypertension Or Other Condition__________ Cannot Clinically Determine   Supporting Information: Risk Factors: Admitted with CVA History of hypertension, hyperlipidemia, and smoking ED provider note: "poorly controlled blood pressure with the blood pressures running 172/109, 157/114. Patient was given some Catapres medication she is normally on and it did bring the diastolic down to 701. Patient will need further improvement with hypertension and the blood sugar" H&P:  "Noted to be hypertensive in the emergency department"   Signs and Symptoms: 11/17/13 SBP range:208-157 DBP range:134-108 11/18/13 SBP range: 166-148 DBP range:  119-95 11/19/13 SBP range:  171-142 DBP range:  134-108 11/20/13 SBP range:167 DBP range:130  Treatment Norvasc 10 mg daily Lotensin 20mg  daily Catapres patch 0.3mg  transdermal weekly Apresoline 5mg  IV q4h prn SBP>180 &/or DBP >90 Lopressor 12.5mg  bid Vital signs q2h x 12, then q4h  Thank You, Estella Husk ,RN Clinical Documentation Specialist:  [facility] Information Management

## 2013-11-21 LAB — CBC
HCT: 46.3 % — ABNORMAL HIGH (ref 36.0–46.0)
Hemoglobin: 16.1 g/dL — ABNORMAL HIGH (ref 12.0–15.0)
MCH: 32.7 pg (ref 26.0–34.0)
MCHC: 34.8 g/dL (ref 30.0–36.0)
MCV: 93.9 fL (ref 78.0–100.0)
Platelets: 186 10*3/uL (ref 150–400)
RBC: 4.93 MIL/uL (ref 3.87–5.11)
RDW: 12.8 % (ref 11.5–15.5)
WBC: 6.8 10*3/uL (ref 4.0–10.5)

## 2013-11-21 LAB — GLUCOSE, CAPILLARY
Glucose-Capillary: 210 mg/dL — ABNORMAL HIGH (ref 70–99)
Glucose-Capillary: 387 mg/dL — ABNORMAL HIGH (ref 70–99)

## 2013-11-21 MED ORDER — METOPROLOL TARTRATE 12.5 MG HALF TABLET: 12.5000 mg | ORAL_TABLET | Freq: Two times a day (BID) | ORAL | Status: DC

## 2013-11-21 MED ORDER — NICOTINE 14 MG/24HR TD PT24: 14.0000 mg | MEDICATED_PATCH | Freq: Every day | TRANSDERMAL | Status: DC

## 2013-11-21 MED ORDER — BENAZEPRIL HCL 10 MG PO TABS: 40.0000 mg | ORAL_TABLET | Freq: Every day | ORAL | Status: DC

## 2013-11-21 MED ORDER — FLUOXETINE HCL 10 MG PO CAPS
10.0000 mg | ORAL_CAPSULE | Freq: Every day | ORAL | Status: DC
  Filled 2013-11-21: qty 1

## 2013-11-21 MED ORDER — STROKE: EARLY STAGES OF RECOVERY BOOK
Freq: Once | Status: AC
  Administered 2013-11-21: 10:00:00
  Filled 2013-11-21: qty 1

## 2013-11-21 MED ORDER — FLUOXETINE HCL 10 MG PO CAPS: 10.0000 mg | ORAL_CAPSULE | Freq: Every day | ORAL | Status: DC

## 2013-11-21 MED ORDER — INSULIN GLARGINE 100 UNIT/ML ~~LOC~~ SOLN
30.0000 [IU] | Freq: Every day | SUBCUTANEOUS | Status: DC
  Filled 2013-11-21: qty 0.3

## 2013-11-21 MED ORDER — CLOPIDOGREL BISULFATE 75 MG PO TABS: 75.0000 mg | ORAL_TABLET | Freq: Every day | ORAL | Status: DC

## 2013-11-21 MED ORDER — BENAZEPRIL HCL 40 MG PO TABS: 40.0000 mg | ORAL_TABLET | Freq: Every day | ORAL | Status: DC

## 2013-11-21 NOTE — Progress Notes (Signed)
Inpatient Diabetes Program Recommendations  AACE/ADA: New Consensus Statement on Inpatient Glycemic Control (2013)  Target Ranges:  Prepandial:   less than 140 mg/dL      Peak postprandial:   less than 180 mg/dL (1-2 hours)      Critically ill patients:  140 - 180 mg/dL   Results for [name], [name] (MRN [id number]) as of 11/21/2013 09:30  Ref. Range 11/20/2013 07:52 11/20/2013 12:19 11/20/2013 16:55 11/20/2013 22:17 11/21/2013 07:35  Glucose-Capillary Latest Range: 70-99 mg/dL 254 (H) 260 (H) 245 (H) 257 (H) 210 (H)   Diabetes history: DM2 Outpatient Diabetes medications: Metformin 1000 mg BID and Lantus 30 units QHS Current orders for Inpatient glycemic control: Lantus 20 units QHS, Novolog 0-20 units TID with meals, and Novolog 0-5 units QHS  Inpatient Diabetes Program Recommendations Insulin - Basal: Please consider increasing Lantus to 30 units QHS.  Note: Spoke with patient about diabetes and home regimen for diabetes control. Patient reports that she has diabetes and she was not taking any medications for diabetes in the past year.  She states that she "just stopped taking diabetes medications because I believed that God could heal me from diabetes without taking medications".  Expressed respect for patient beliefs and explained importance of diabetes control and how diabetes medications can help keep diabetes controlled.  Patient reported that she recently saw Dr. Dorris Fetch and he prescribed her Lantus which she filled the prescription and actually has a Lantus pen with her at the hospital.  However, she reports that she did not start taking the Lantus yet.  She is very knowledgeable about insulin and how to administer insulin injections.  She also reports that she stopped the Metformin because it caused GI upset. Discussed A1C (11.0% on 11/17/2013) results and explained what an A1C is, basic pathophysiology of DM Type 2, basic home care, importance of checking CBGs and maintaining good CBG control  to prevent long-term and short-term complications.  RNs to provide ongoing basic DM education at bedside with this patient and engage patient to actively check blood glucose and administer insulin injections. Patient verbalized understanding of information discussed and reports that she has no further questions related to diabetes at this time.  Called Dr. Liliane Channel office but had to leave a message.  Then called [facility] to verify Lantus prescribed by Dr. Dorris Fetch.  Patient was prescribed Lantus 30 units QHS by Dr. Dorris Fetch.  Thanks, Barnie Alderman, RN, MSN, CCRN Diabetes Coordinator Inpatient Diabetes Program [phone] (Team Pager) [phone] ([facility] office) [phone] [facility] office)

## 2013-11-21 NOTE — H&P (Signed)
Physician Discharge Summary  [name] [id number] DOB: Jul 26, 1966 DOA: 11/17/2013  PCP: Tula Nakayama, MD  Admit date: 11/17/2013 Discharge date: 11/21/2013  Time spent: 40 minutes  Recommendations for Outpatient Follow-up:  1. Dr. Moshe Cipro PCP in 1-2 weeks for evaluation of BP and Diabetes control. Titrate medications for optimal BP control. Evaluate medications specifically Prozac and plavix interaction. Prozac dose reduced due to interaction 2. [place] [facility] for diabetes eduation 3. [place] RN to assist with CBG monitoring.   Discharge Diagnoses:  Principal Problem:   CVA (cerebral infarction) Active Problems:   HYPERLIPIDEMIA   Diabetic Charcot foot   HTN (hypertension), malignant   Hypertensive cardiomyopathy   CHF (congestive heart failure)   Hypertensive emergency   Hyperglycemia   Tobacco abuse   Obesity   Thalamic infarct, acute   DM (diabetes mellitus)   Discharge Condition: stable  Diet recommendation: heart healthy carb modified  Filed Weights   11/17/13 1134 11/17/13 1945  Weight: 82.555 kg (182 lb) 83.7 kg (184 lb 8.4 oz)    History of present illness:  48 year old female with a known medical history of uncontrolled diabetes uncontrolled hypertension and medical noncompliance presented with right-sided weakness ongoing for 4 weeks. CT yielded small acute Thalamic infarct and hypertensive urgency.    [name] is a 48 y.o. female with a past medical history that includes uncontrolled diabetes, uncontrolled hypertension, medical noncompliance, diastolic dysfunction, hyperlipidemia, obesity, presented to the emergency room on 11/17/13 with chief complaint of right-sided weakness. Patient reported that about a month prior she developed generalized fatigue with right-sided weakness. She indicated some numbness and tingling of her right hand as well as having difficulty gripping forks and pencils. She also described a  feeling of moving in slow motion. Her sister reported intermittent slurred speech. Spoke to her primary care provider at the onset of these symptoms he recommended she come to the emergency room but the patient did not. She stated that the symptoms continued albeit intermittently. On January 15 she saw Dr. Dorris Fetch who recommended she come to the emergency room for a blood pressure of 207/130. Patient decided not to be seen at that time. She stated that  she came in because "I'm just not getting any better and I feel so tired all the time". During this timeframe she denied any chest pain palpitation headache dizziness. She did report one fall where her legs "just gave out". She denied any difficulty swallowing, anorexia abdominal pain nausea vomiting diarrhea or constipation. She denied any fever chills dysuria hematuria frequency or urgency. She did not check her blood sugar so she had no idea how it's been running lately. He reported taking her medication as directed. Initial workup in the emergency room included a head CT yielding small acute infarct in the left thalamus no other acute infarct apparent. Chest x-ray is unremarkable. Lab work yields serum glucose of 365. Comprehensive metabolic panel with potassium of 3.6 and albumin of 3.1 otherwise unremarkable. CBC unremarkable and initial troponin negative. Urinalysis with greater than 300 protein greater than 1000 glucose negative leukocytes negative nitrite many bacteria and many squama/epithelial. Vital signs in the emergency room 200/128. In the emergency room she was given IV fluids, 0.3 mg of clonidine, 10 units of insulin intravenously x2.   Hospital Course:  CVA (cerebral infarction):Acute right cerebellar infarct. No definite deficits. Likely related to long history of uncontrolled diabetes and uncontrolled hypertension secondary to medical noncompliance. Echo with EF 60-65% and normal wall motion  and grade 1 diastolic dysfunction. Lipid panel with  triglyceride 182 otherwise within the limits of normal.hemoglobin A1c 11.0. continued 81 mg of aspirin and Plavix and BB added. Continue her Crestor. Follow up with PCP in 1-2 weeks.  Active Problems:  HTN malignant: Home medications include amlodipine Lotensin clonidine patch as well as clonidine tablet, Lasix. Have continued lasix, clonidine patch and lotenisin. Added BB. Only fair control. Will need close OP follow up.   Transient SVT. Occurred with exertion. No further episodes on tele. TSH within the limits of normal.   Diabetes mellitus type 2: Uncontrolled. Lantus increased to 30units. Pt reports Dr Dorris Fetch has provided "pens" but she has not used. Spoke with Dr. Liliane Channel office to confirm lanuts dose of 30units. Provided with education and instruction. She demonstrated her ability to monitor CBG and administer insulin. HgA1c 11.0   BRBPR: reported but not witnessed. No s/sx active bleeding. Concern given started on Plavix. Doubt GI bleed. CBC stable.    Tobacco use: counseled regarding cessation.  Obesity: BMI 34.5. Nutritional consult  Anxiety: chart review indicates recently weaned off of benzos. On prozac. Prozac dose reduced by half due to interaction with plavix.  Recommend evaluating prozac and plavix interactions.  Remained at baseline   Procedures: 2-D echocardiogram: Left ventricular ejection fraction 60-65%. Normal wall motion. Grade 1 diastolic dysfunction   Consultations:  none  Discharge Exam: Filed Vitals:   11/21/13 0616  BP: 165/122  Pulse: 85  Temp: 98.1 F (36.7 C)  Resp: 18    General: obese NAD ambulating in room without problem Cardiovascular: RRR No MGR No LE edema Respiratory: normal effort BS clear bilaterally no wheeze Neuro: alert oriented x3. Improved right hand strength. Sensation to UE intact bilaterally  Discharge Instructions      Discharge Orders   Future Appointments Provider Department Dept Phone   12/13/2013 1:15 PM [name],  MD [facility] [phone]   Future Orders Complete By Expires   Ambulatory referral to Nutrition and Diabetic Education  As directed    Comments:     DM2 hx; A1C 11.0% on 11/17/2013. Need reinforcement of DM education.   Diet - low sodium heart healthy  As directed    Discharge instructions  As directed    Comments:     Take all medications as directed Follow up with PCP 1-2 weeks for evaluation of BP control and diabetes control   Increase activity slowly  As directed        Medication List         albuterol 108 (90 BASE) MCG/ACT inhaler  Commonly known as:  PROVENTIL HFA;VENTOLIN HFA  Inhale 2 puffs into the lungs every 6 (six) hours as needed for wheezing.     amLODipine 10 MG tablet  Commonly known as:  NORVASC  Take 1 tablet (10 mg total) by mouth daily.     aspirin 81 MG EC tablet  Take 1 tablet (81 mg total) by mouth daily.     benazepril 40 MG tablet  Commonly known as:  LOTENSIN  Take 1 tablet (40 mg total) by mouth daily.  Start taking on:  11/22/2013     cloNIDine 0.3 mg/24hr patch  Commonly known as:  CATAPRES - Dosed in mg/24 hr  APPLY 1 PATCH ONTO THE SKIN ONCE WEEKLY. REMOVE USED PATCH.     clopidogrel 75 MG tablet  Commonly known as:  PLAVIX  Take 1 tablet (75 mg total) by mouth daily with breakfast.  FLUoxetine 10 MG capsule  Commonly known as:  PROZAC  Take 1 capsule (10 mg total) by mouth daily.  Start taking on:  11/22/2013     furosemide 40 MG tablet  Commonly known as:  LASIX  Take 1 tablet (40 mg total) by mouth daily. "Fluid pill". For your heart.     insulin glargine 100 UNIT/ML injection  Commonly known as:  LANTUS  Inject 30 Units into the skin at bedtime. Recently prescribed by Dr. Dorris Fetch, but patient has not been taking     metFORMIN 1000 MG tablet  Commonly known as:  GLUCOPHAGE  Take 1 tablet (1,000 mg total) by mouth 2 (two) times daily with a meal.     metoprolol tartrate 12.5 mg Tabs tablet  Commonly known as:   LOPRESSOR  Take 0.5 tablets (12.5 mg total) by mouth 2 (two) times daily.     nicotine 14 mg/24hr patch  Commonly known as:  NICODERM CQ - dosed in mg/24 hours  Place 1 patch (14 mg total) onto the skin daily.     pantoprazole 40 MG tablet  Commonly known as:  PROTONIX  Take 1 tablet (40 mg total) by mouth daily.     potassium chloride SA 20 MEQ tablet  Commonly known as:  K-DUR,KLOR-CON  Take 20 mEq by mouth. One and one half daily     rosuvastatin 40 MG tablet  Commonly known as:  CRESTOR  Take 1 tablet (40 mg total) by mouth daily.     TraMADol HCl 50 MG Tbdp  One tablet twice daily, as  needed, for uncontrolled back pain       No Known Allergies Follow-up Information   Follow up with [place]. Anderson Malta, Diabetic educator will call after DC.)    Contact information:   [id number] [place] [address] [phone]      Follow up with Tula Nakayama, MD. Schedule an appointment as soon as possible for a visit in 2 weeks. (for evaluation of BP control and diabetes control)    Specialty:  Family Medicine   Contact information:   [address] [place] [address] [phone]        The results of significant diagnostics from this hospitalization (including imaging, microbiology, ancillary and laboratory) are listed below for reference.    Significant Diagnostic Studies: Dg Chest 2 View  11/17/2013   CLINICAL DATA:  Weakness.  Smoker.  EXAM: CHEST  2 VIEW  COMPARISON:  11/11/2012  FINDINGS: The heart size and mediastinal contours are within normal limits. Both lungs are clear. The visualized skeletal structures are unremarkable.  IMPRESSION: No active cardiopulmonary disease.   Electronically Signed   By: Earle Gell M.D.   On: 11/17/2013 14:56   Ct Head Wo Contrast  11/17/2013   CLINICAL DATA:  Right arm heaviness and weakness  EXAM: CT HEAD WITHOUT CONTRAST  TECHNIQUE: Contiguous axial  images were obtained from the base of the skull through the vertex without intravenous contrast. Study was obtained within 24 hr of patient's arrival the emergency department.  COMPARISON:  May 31, 2012  FINDINGS: The ventricles are upper normal in size with normal contour. A small cavum septum pellucidum and a small cavum vergae are anatomic variants. There is no demonstrable mass, hemorrhage, extra-axial fluid collection, or midline shift.  There is an acute small infarct in the mid left thalamus. No other acute infarct is identified. Patchy small vessel disease in the  centra semiovale are stable.  The vertebrobasilar system is again noted to be somewhat dolichoectatic for age.  IMPRESSION: Small acute infarct in the left thalamus. No other acute infarct apparent. Stable periventricular small vessel disease. No hemorrhage or mass effect.  : Bony calvarium appears intact. The mastoid air cells are clear. A defect in the medial left orbital wall is probably due to old trauma. Fat from the medial left orbit extends into the area of depression, a stable finding.   Electronically Signed   By: Lowella Grip M.D.   On: 11/17/2013 14:32   Mr Jodene Nam Head Wo Contrast  11/17/2013   CLINICAL DATA:  Confusion. Altered mental status. Slurred speech. Weakness since 10/30/2013. Right greater on left upper extremity is some subtle lower extremity weakness. Recent episode of markedly elevated blood pressure. Diabetic (poorly controlled) hypertensive (poorly controlled) hyperlipidemia obese smoker.  EXAM: MRI HEAD WITHOUT CONTRAST  MRA HEAD WITHOUT CONTRAST  TECHNIQUE: Multiplanar, multiecho pulse sequences of the brain and surrounding structures were obtained without intravenous contrast. Angiographic images of the head were obtained using MRA technique without contrast.  COMPARISON:  11/17/2013 CT.  09/24/2004 MR.  FINDINGS: MRI HEAD FINDINGS  Small subacute nonhemorrhagic right cerebellar infarct.  No intracranial  hemorrhage.  Prominent progressive white matter type changes. Although several of the white matter changes involve the periventricular region, some of which appear perpendicular as can be seen with plaques related to multiple sclerosis (which cannot be excluded given the patient's age and gender), the fact that the patient has multiple risk factors of atherosclerosis in addition to remote bilateral thalamic infarcts suggests that the white matter type changes may be related to result of small vessel disease. Other causes of white matter type changes such as that secondary to vasculitis or inflammatory process are secondary less likely considerations.  No hydrocephalus.  No intracranial mass lesion noted on this unenhanced exam.  Exophthalmos.  Cervical medullary junction, pituitary region and pineal region unremarkable.  Opacification left ethmoid sinus air cell minimal paranasal sinus mucosal thickening otherwise noted.  MRA HEAD FINDINGS  Exam is motion degraded.  Markedly tortuous vertical cervical segment of the internal carotid artery bilaterally.  Ectatic cavernous segment right internal carotid artery. Artifact extends through the cavernous segment of the internal carotid arteries bilaterally.  Moderate narrowing distal M1 segment/ bifurcation of the middle cerebral artery bilaterally. Artifact may partially contribute to this appearance.  Moderate to marked narrowing A1 segment anterior cerebral artery more notable on the left.  A2 segment anterior cerebral artery and middle cerebral artery distal branch vessel mild to moderate irregularity and narrowing bilaterally.  Markedly ectatic vertebral arteries with fusiform dilation on the left. Moderate to marked narrowing distal left vertebral artery. Mild to moderate narrowing right vertebral artery.  Non visualization right posterior inferior cerebellar artery. Narrowing left posterior inferior cerebellar artery.  Moderate narrowing proximal basilar artery.  Basilar artery beyond this region is ectatic.  Nonvisualization anterior inferior cerebellar artery bilaterally.  Narrowing of the superior cerebellar artery bilaterally.  Long segment moderate to marked stenosis of the P2 segment of the right posterior cerebral artery. Mild to moderate narrowing P2 segment left posterior cerebral artery.  Mild to slightly moderate narrowing distal branches of the posterior cerebral artery more notable on left.  No aneurysm or vascular malformation noted.  IMPRESSION: MR brain:  Small subacute nonhemorrhagic right cerebellar infarct.  Prominent progressive white matter type changes. Although several of the white matter changes involve the periventricular region, some of which appear  perpendicular as can be seen with plaques related to multiple sclerosis (which cannot be excluded given the patient's age and gender), the fact that the patient has multiple risk factors of atherosclerosis in addition to remote bilateral thalamic infarcts suggests that the white matter type changes may be related to result of small vessel disease.  MR angiogram:  Motion degraded with prominent intracranial atherosclerotic type changes as detailed above.   Electronically Signed   By: Chauncey Cruel M.D.   On: 11/17/2013 18:37   Mr Brain Wo Contrast  11/17/2013   CLINICAL DATA:  Confusion. Altered mental status. Slurred speech. Weakness since 10/30/2013. Right greater on left upper extremity is some subtle lower extremity weakness. Recent episode of markedly elevated blood pressure. Diabetic (poorly controlled) hypertensive (poorly controlled) hyperlipidemia obese smoker.  EXAM: MRI HEAD WITHOUT CONTRAST  MRA HEAD WITHOUT CONTRAST  TECHNIQUE: Multiplanar, multiecho pulse sequences of the brain and surrounding structures were obtained without intravenous contrast. Angiographic images of the head were obtained using MRA technique without contrast.  COMPARISON:  11/17/2013 CT.  09/24/2004 MR.  FINDINGS: MRI  HEAD FINDINGS  Small subacute nonhemorrhagic right cerebellar infarct.  No intracranial hemorrhage.  Prominent progressive white matter type changes. Although several of the white matter changes involve the periventricular region, some of which appear perpendicular as can be seen with plaques related to multiple sclerosis (which cannot be excluded given the patient's age and gender), the fact that the patient has multiple risk factors of atherosclerosis in addition to remote bilateral thalamic infarcts suggests that the white matter type changes may be related to result of small vessel disease. Other causes of white matter type changes such as that secondary to vasculitis or inflammatory process are secondary less likely considerations.  No hydrocephalus.  No intracranial mass lesion noted on this unenhanced exam.  Exophthalmos.  Cervical medullary junction, pituitary region and pineal region unremarkable.  Opacification left ethmoid sinus air cell minimal paranasal sinus mucosal thickening otherwise noted.  MRA HEAD FINDINGS  Exam is motion degraded.  Markedly tortuous vertical cervical segment of the internal carotid artery bilaterally.  Ectatic cavernous segment right internal carotid artery. Artifact extends through the cavernous segment of the internal carotid arteries bilaterally.  Moderate narrowing distal M1 segment/ bifurcation of the middle cerebral artery bilaterally. Artifact may partially contribute to this appearance.  Moderate to marked narrowing A1 segment anterior cerebral artery more notable on the left.  A2 segment anterior cerebral artery and middle cerebral artery distal branch vessel mild to moderate irregularity and narrowing bilaterally.  Markedly ectatic vertebral arteries with fusiform dilation on the left. Moderate to marked narrowing distal left vertebral artery. Mild to moderate narrowing right vertebral artery.  Non visualization right posterior inferior cerebellar artery. Narrowing  left posterior inferior cerebellar artery.  Moderate narrowing proximal basilar artery. Basilar artery beyond this region is ectatic.  Nonvisualization anterior inferior cerebellar artery bilaterally.  Narrowing of the superior cerebellar artery bilaterally.  Long segment moderate to marked stenosis of the P2 segment of the right posterior cerebral artery. Mild to moderate narrowing P2 segment left posterior cerebral artery.  Mild to slightly moderate narrowing distal branches of the posterior cerebral artery more notable on left.  No aneurysm or vascular malformation noted.  IMPRESSION: MR brain:  Small subacute nonhemorrhagic right cerebellar infarct.  Prominent progressive white matter type changes. Although several of the white matter changes involve the periventricular region, some of which appear perpendicular as can be seen with plaques related to multiple sclerosis (which cannot  be excluded given the patient's age and gender), the fact that the patient has multiple risk factors of atherosclerosis in addition to remote bilateral thalamic infarcts suggests that the white matter type changes may be related to result of small vessel disease.  MR angiogram:  Motion degraded with prominent intracranial atherosclerotic type changes as detailed above.   Electronically Signed   By: Chauncey Cruel M.D.   On: 11/17/2013 18:37   US Carotid Duplex Bilateral  11/18/2013   CLINICAL DATA:  Stroke  EXAM: BILATERAL CAROTID DUPLEX ULTRASOUND  TECHNIQUE: Pearline Cables scale imaging, color Doppler and duplex ultrasound were performed of bilateral carotid and vertebral arteries in the neck.  COMPARISON:  None.  FINDINGS: Criteria: Quantification of carotid stenosis is based on velocity parameters that correlate the residual internal carotid diameter with NASCET-based stenosis levels, using the diameter of the distal internal carotid lumen as the denominator for stenosis measurement.  The following velocity measurements were obtained:   RIGHT  ICA:  74 cm/sec  CCA:  44 cm/sec  SYSTOLIC ICA/CCA RATIO:  2.53  DIASTOLIC ICA/CCA RATIO:  ECA:  74 cm/sec  LEFT  ICA:  30 cm/sec  CCA:  53 cm/sec  SYSTOLIC ICA/CCA RATIO:  6.64  DIASTOLIC ICA/CCA RATIO:  ECA:  53 cm/sec  RIGHT CAROTID ARTERY: Little if any plaque in the bulb. Low resistance internal carotid Doppler pattern.  RIGHT VERTEBRAL ARTERY:  Antegrade.  Normal Doppler pattern.  LEFT CAROTID ARTERY: Little if any plaque in the bulb. Low resistance internal carotid Doppler pattern.  LEFT VERTEBRAL ARTERY:  Antegrade.  Normal Doppler pattern.  IMPRESSION: Less than 50% stenosis in the right and left internal carotid arteries.   Electronically Signed   By: Maryclare Bean M.D.   On: 11/18/2013 09:01    Microbiology: No results found for this or any previous visit (from the past 240 hour(s)).   Labs: Basic Metabolic Panel:  Recent Labs Lab 11/17/13 1216 11/20/13 0620  NA 139 138  K 3.6* 3.9  CL 100 101  CO2 26 25  GLUCOSE 365* 279*  BUN 11 23  CREATININE 0.80 0.93  CALCIUM 9.5 9.5  MG  --  1.6   Liver Function Tests:  Recent Labs Lab 11/17/13 1216  AST 13  ALT 14  ALKPHOS 96  BILITOT 0.6  PROT 6.9  ALBUMIN 3.1*   No results found for this basename: LIPASE, AMYLASE,  in the last 168 hours No results found for this basename: AMMONIA,  in the last 168 hours CBC:  Recent Labs Lab 11/17/13 1216 11/20/13 0620 11/21/13 0530  WBC 7.5 8.3 6.8  NEUTROABS 5.4  --   --   HGB 16.2* 15.9* 16.1*  HCT 46.2* 45.7 46.3*  MCV 92.8 94.4 93.9  PLT 202 189 186   Cardiac Enzymes:  Recent Labs Lab 11/17/13 1216  TROPONINI <0.30   BNP: BNP (last 3 results)  Recent Labs  11/17/13 1354  PROBNP 506.2*   CBG:  Recent Labs Lab 11/20/13 0752 11/20/13 1219 11/20/13 1655 11/20/13 2217 11/21/13 0735  GLUCAP 254* 260* 245* 257* 210*       Signed:  Dyanne Carrel M  [facility] 11/21/2013, 10:48 AM

## 2013-11-21 NOTE — Progress Notes (Addendum)
She feels much better today. No events overnight. No recurrent bleeding reported.right hand strength is improving.  Overall her vitals are stable, she remains hypertensive but asymptomatic.CBC is unremarkable. Her exam is unchanged with normal heart sounds, lung exam.  She has worked with nursing and with the diabetic RN and has demonstrated competency in using Lantus pen.she reports inspiration to be compliant with medications and reduce her risk factors.  Assessment/plan:  Plan discharge home today on aspirin, Plavix, Lipitor.  Cut Prozac in half as this can interact with Plavix and reduce efficacy. Consider weaning off this medication as an outpatient and alternative SSRI or antidepressant. I discussed this with the patient.  Discharge on Lantus 30 units Q24.  Will continue on newly added Lopressor (asymptomatic SVT), clonidine patch, Norvasc. Benazepril has been increased.  She should not be taking oral clonidine andTransdermal clonidine.  Recommend followup in one week for titration of blood pressure medications and glycemic control.  Murray Hodgkins, MD [facility] [phone]

## 2013-11-21 NOTE — Progress Notes (Signed)
Patient with orders to be discharge home. Stroke book and handouts given, educated patient, verbalized understanding. Discharge instructions given, patient verbalized understanding. Patient stable. Patient left with daughter in private vehicle. `

## 2013-11-21 NOTE — H&P (Signed)
Patient seen, independently examined and chart reviewed. I agree with exam, assessment and plan discussed with Dyanne Carrel, NP.  Assessment/plan:  Plan discharge home today on aspirin, Plavix, Crestor  Cut Prozac in half as this can interact with Plavix and reduce efficacy. Consider weaning off this medication as an outpatient and alternative SSRI or antidepressant. I discussed this with the patient.  Discharge on Lantus 30 units Q24.  Will continue on newly added Lopressor (asymptomatic SVT), clonidine patch, Norvasc. Benazepril has been increased.  She should not be taking oral clonidine andTransdermal clonidine.  Recommend followup in one week for titration of blood pressure medications and glycemic control.  Murray Hodgkins, MD [facility] [phone]

## 2013-11-23 ENCOUNTER — Other Ambulatory Visit: Payer: Self-pay | Admitting: Family Medicine

## 2013-11-24 ENCOUNTER — Other Ambulatory Visit: Payer: Self-pay | Admitting: Family Medicine

## 2013-11-28 NOTE — Discharge Summary (Signed)
[name] [id number] DOB: 13-Nov-1965 DOA: 11/17/2013  PCP: Tula Nakayama, MD  Admit date: 11/17/2013  Discharge date: 11/21/2013  Time spent: 40 minutes  Recommendations for Outpatient Follow-up:  1. Dr. Moshe Cipro PCP in 1-2 weeks for evaluation of BP and Diabetes control. Titrate medications for optimal BP control. Evaluate medications specifically Prozac and plavix interaction. Prozac dose reduced due to interaction 2. [facility] outpatient nutritions services for diabetes eduation 3. [place] RN to assist with CBG monitoring.  Discharge Diagnoses:  Principal Problem:  CVA (cerebral infarction)  Active Problems:  HYPERLIPIDEMIA  Diabetic Charcot foot  HTN (hypertension), malignant  Hypertensive cardiomyopathy  CHF (congestive heart failure)  Hypertensive emergency  Hyperglycemia  Tobacco abuse  Obesity  Thalamic infarct, acute  DM (diabetes mellitus)   Discharge Condition: stable  Diet recommendation: heart healthy carb modified  Filed Weights    11/17/13 1134  11/17/13 1945   Weight:  82.555 kg (182 lb)  83.7 kg (184 lb 8.4 oz)    History of present illness:  48 year old female with a known medical history of uncontrolled diabetes uncontrolled hypertension and medical noncompliance presented with right-sided weakness ongoing for 4 weeks. CT yielded small acute Thalamic infarct and hypertensive urgency.  [name] is a 48 y.o. female with a past medical history that includes uncontrolled diabetes, uncontrolled hypertension, medical noncompliance, diastolic dysfunction, hyperlipidemia, obesity, presented to the emergency room on 11/17/13 with chief complaint of right-sided weakness. Patient reported that about a month prior she developed generalized fatigue with right-sided weakness. She indicated some numbness and tingling of her right hand as well as having difficulty gripping forks and pencils. She also described a feeling of moving in slow motion. Her sister  reported intermittent slurred speech. Spoke to her primary care provider at the onset of these symptoms he recommended she come to the emergency room but the patient did not. She stated that the symptoms continued albeit intermittently. On January 15 she saw Dr. Dorris Fetch who recommended she come to the emergency room for a blood pressure of 207/130. Patient decided not to be seen at that time. She stated that she came in because "I'm just not getting any better and I feel so tired all the time". During this timeframe she denied any chest pain palpitation headache dizziness. She did report one fall where her legs "just gave out". She denied any difficulty swallowing, anorexia abdominal pain nausea vomiting diarrhea or constipation. She denied any fever chills dysuria hematuria frequency or urgency. She did not check her blood sugar so she had no idea how it's been running lately. He reported taking her medication as directed. Initial workup in the emergency room included a head CT yielding small acute infarct in the left thalamus no other acute infarct apparent. Chest x-ray is unremarkable. Lab work yields serum glucose of 365. Comprehensive metabolic panel with potassium of 3.6 and albumin of 3.1 otherwise unremarkable. CBC unremarkable and initial troponin negative. Urinalysis with greater than 300 protein greater than 1000 glucose negative leukocytes negative nitrite many bacteria and many squama/epithelial. Vital signs in the emergency room 200/128. In the emergency room she was given IV fluids, 0.3 mg of clonidine, 10 units of insulin intravenously x2.  Hospital Course:  CVA (cerebral infarction):Acute right cerebellar infarct. No definite deficits. Likely related to long history of uncontrolled diabetes and uncontrolled hypertension secondary to medical noncompliance. Echo with EF 60-65% and normal wall motion and grade 1 diastolic dysfunction. Lipid panel with triglyceride 182 otherwise within the limits  of  normal.hemoglobin A1c 11.0. continued 81 mg of aspirin and Plavix and BB added. Continue her Crestor. Follow up with PCP in 1-2 weeks.  Active Problems:  HTN malignant: Home medications include amlodipine Lotensin clonidine patch as well as clonidine tablet, Lasix. Have continued lasix, clonidine patch and lotenisin. Added BB. Only fair control. Will need close OP follow up.  Transient SVT. Occurred with exertion. No further episodes on tele. TSH within the limits of normal.  Diabetes mellitus type 2: Uncontrolled. Lantus increased to 30units. Pt reports Dr Dorris Fetch has provided "pens" but she has not used. Spoke with Dr. Liliane Channel office to confirm lanuts dose of 30units. Provided with education and instruction. She demonstrated her ability to monitor CBG and administer insulin. HgA1c 11.0  BRBPR: reported but not witnessed. No s/sx active bleeding. Concern given started on Plavix. Doubt GI bleed. CBC stable.  Tobacco use: counseled regarding cessation.  Obesity: BMI 34.5. Nutritional consult  Anxiety: chart review indicates recently weaned off of benzos. On prozac. Prozac dose reduced by half due to interaction with plavix. Recommend evaluating prozac and plavix interactions. Remained at baseline  Procedures:  2-D echocardiogram: Left ventricular ejection fraction 60-65%. Normal wall motion. Grade 1 diastolic dysfunction  Consultations:  none Discharge Exam:  Filed Vitals:    11/21/13 0616   BP:  165/122   Pulse:  85   Temp:  98.1 F (36.7 C)   Resp:  18    General: obese NAD ambulating in room without problem  Cardiovascular: RRR No MGR No LE edema  Respiratory: normal effort BS clear bilaterally no wheeze  Neuro: alert oriented x3. Improved right hand strength. Sensation to UE intact bilaterally  Discharge Instructions      Discharge Orders    Future Appointments  Provider  Department  Dept Phone    12/13/2013 1:15 PM  [name], MD  [facility]  [phone]     Future Orders  Complete By  Expires    Ambulatory referral to Nutrition and Diabetic Education  As directed     Comments:    DM2 hx; A1C 11.0% on 11/17/2013. Need reinforcement of DM education.    Diet - low sodium heart healthy  As directed     Discharge instructions  As directed     Comments:    Take all medications as directed  Follow up with PCP 1-2 weeks for evaluation of BP control and diabetes control    Increase activity slowly  As directed         Medication List         albuterol 108 (90 BASE) MCG/ACT inhaler    Commonly known as: PROVENTIL HFA;VENTOLIN HFA    Inhale 2 puffs into the lungs every 6 (six) hours as needed for wheezing.    amLODipine 10 MG tablet    Commonly known as: NORVASC    Take 1 tablet (10 mg total) by mouth daily.    aspirin 81 MG EC tablet    Take 1 tablet (81 mg total) by mouth daily.    benazepril 40 MG tablet    Commonly known as: LOTENSIN    Take 1 tablet (40 mg total) by mouth daily.    Start taking on: 11/22/2013    cloNIDine 0.3 mg/24hr patch    Commonly known as: CATAPRES - Dosed in mg/24 hr    APPLY 1 PATCH ONTO THE SKIN ONCE WEEKLY. REMOVE USED PATCH.    clopidogrel 75 MG tablet  Commonly known as: PLAVIX    Take 1 tablet (75 mg total) by mouth daily with breakfast.    FLUoxetine 10 MG capsule    Commonly known as: PROZAC    Take 1 capsule (10 mg total) by mouth daily.    Start taking on: 11/22/2013    furosemide 40 MG tablet    Commonly known as: LASIX    Take 1 tablet (40 mg total) by mouth daily. "Fluid pill". For your heart.    insulin glargine 100 UNIT/ML injection    Commonly known as: LANTUS    Inject 30 Units into the skin at bedtime. Recently prescribed by Dr. Dorris Fetch, but patient has not been taking    metFORMIN 1000 MG tablet    Commonly known as: GLUCOPHAGE    Take 1 tablet (1,000 mg total) by mouth 2 (two) times daily with a meal.    metoprolol tartrate 12.5 mg Tabs tablet    Commonly known as: LOPRESSOR    Take 0.5  tablets (12.5 mg total) by mouth 2 (two) times daily.    nicotine 14 mg/24hr patch    Commonly known as: NICODERM CQ - dosed in mg/24 hours    Place 1 patch (14 mg total) onto the skin daily.    pantoprazole 40 MG tablet    Commonly known as: PROTONIX    Take 1 tablet (40 mg total) by mouth daily.    potassium chloride SA 20 MEQ tablet    Commonly known as: K-DUR,KLOR-CON    Take 20 mEq by mouth. One and one half daily    rosuvastatin 40 MG tablet    Commonly known as: CRESTOR    Take 1 tablet (40 mg total) by mouth daily.    TraMADol HCl 50 MG Tbdp    One tablet twice daily, as needed, for uncontrolled back pain      No Known Allergies  Follow-up Information    Follow up with [facility]. Anderson Malta, Diabetic educator will call after DC.)    Contact information:    [address]  [id number] [place] [address]  [phone]       Follow up with Tula Nakayama, MD. Schedule an appointment as soon as possible for a visit in 2 weeks. (for evaluation of BP control and diabetes control)    Specialty: Family Medicine    Contact information:    [address] [place]  [place] [address]  [phone]       The results of significant diagnostics from this hospitalization (including imaging, microbiology, ancillary and laboratory) are listed below for reference.   Significant Diagnostic Studies:  Dg Chest 2 View  11/17/2013 CLINICAL DATA: Weakness. Smoker. EXAM: CHEST 2 VIEW COMPARISON: 11/11/2012 FINDINGS: The heart size and mediastinal contours are within normal limits. Both lungs are clear. The visualized skeletal structures are unremarkable. IMPRESSION: No active cardiopulmonary disease. Electronically Signed By: Earle Gell M.D. On: 11/17/2013 14:56  Ct Head Wo Contrast COMPARISON: May 31, 2012 FINDINGS: The ventricles are upper normal in size with normal contour. A small cavum septum pellucidum and a small cavum vergae are  anatomic variants. There is no demonstrable mass, hemorrhage, extra-axial fluid collection, or midline shift. There is an acute small infarct in the mid left thalamus. No other acute infarct is identified. Patchy small vessel disease in the centra semiovale are stable. The vertebrobasilar system is again noted to be somewhat dolichoectatic for age. IMPRESSION: Small acute infarct in the  left thalamus. No other acute infarct apparent. Stable periventricular small vessel disease. No hemorrhage or mass effect. : Bony calvarium appears intact. The mastoid air cells are clear. A defect in the medial left orbital wall is probably due to old trauma. Fat from the medial left orbit extends into the area of depression, a stable finding. Electronically Signed By: Lowella Grip M.D. On: 11/17/2013 14:32  Mr Jodene Nam Head Wo Contrast  11/17/2013 CLINICAL DATA: Confusion. Altered mental status. Slurred speech. Weakness since 10/30/2013. Right greater on left upper extremity is some subtle lower extremity weakness. Recent episode of markedly elevated blood pressure. Diabetic (poorly controlled) hypertensive (poorly controlled) hyperlipidemia obese smoker. EXAM: MRI HEAD WITHOUT CONTRAST MRA HEAD WITHOUT CONTRAST TECHNIQUE: Multiplanar, multiecho pulse sequences of the brain and surrounding structures were obtained without intravenous contrast. Angiographic images of the head were obtained using MRA technique without contrast. COMPARISON: 11/17/2013 CT. 09/24/2004 MR. FINDINGS: MRI HEAD FINDINGS Small subacute nonhemorrhagic right cerebellar infarct. No intracranial hemorrhage. Prominent progressive white matter type changes. Although several of the white matter changes involve the periventricular region, some of which appear perpendicular as can be seen with plaques related to multiple sclerosis (which cannot be excluded given the patient's age and gender), the fact that the patient has multiple risk factors of atherosclerosis in  addition to remote bilateral thalamic infarcts suggests that the white matter type changes may be related to result of small vessel disease. Other causes of white matter type changes such as that secondary to vasculitis or inflammatory process are secondary less likely considerations. No hydrocephalus. No intracranial mass lesion noted on this unenhanced exam. Exophthalmos. Cervical medullary junction, pituitary region and pineal region unremarkable. Opacification left ethmoid sinus air cell minimal paranasal sinus mucosal thickening otherwise noted. MRA HEAD FINDINGS Exam is motion degraded. Markedly tortuous vertical cervical segment of the internal carotid artery bilaterally. Ectatic cavernous segment right internal carotid artery. Artifact extends through the cavernous segment of the internal carotid arteries bilaterally. Moderate narrowing distal M1 segment/ bifurcation of the middle cerebral artery bilaterally. Artifact may partially contribute to this appearance. Moderate to marked narrowing A1 segment anterior cerebral artery more notable on the left. A2 segment anterior cerebral artery and middle cerebral artery distal branch vessel mild to moderate irregularity and narrowing bilaterally. Markedly ectatic vertebral arteries with fusiform dilation on the left. Moderate to marked narrowing distal left vertebral artery. Mild to moderate narrowing right vertebral artery. Non visualization right posterior inferior cerebellar artery. Narrowing left posterior inferior cerebellar artery. Moderate narrowing proximal basilar artery. Basilar artery beyond this region is ectatic. Nonvisualization anterior inferior cerebellar artery bilaterally. Narrowing of the superior cerebellar artery bilaterally. Long segment moderate to marked stenosis of the P2 segment of the right posterior cerebral artery. Mild to moderate narrowing P2 segment left posterior cerebral artery. Mild to slightly moderate narrowing distal branches  of the posterior cerebral artery more notable on left. No aneurysm or vascular malformation noted. IMPRESSION: MR brain: Small subacute nonhemorrhagic right cerebellar infarct. Prominent progressive white matter type changes. Although several of the white matter changes involve the periventricular region, some of which appear perpendicular as can be seen with plaques related to multiple sclerosis (which cannot be excluded given the patient's age and gender), the fact that the patient has multiple risk factors of atherosclerosis in addition to remote bilateral thalamic infarcts suggests that the white matter type changes may be related to result of small vessel disease. MR angiogram: Motion degraded with prominent intracranial atherosclerotic type changes as detailed above. Electronically  Signed By: Bridgett Larsson M.D. On: 11/17/2013 18:37  Mr Brain Wo Contrast  11/17/2013 CLINICAL DATA: Confusion. Altered mental status. Slurred speech. Weakness since 10/30/2013. Right greater on left upper extremity is some subtle lower extremity weakness. Recent episode of markedly elevated blood pressure. Diabetic (poorly controlled) hypertensive (poorly controlled) hyperlipidemia obese smoker. EXAM: MRI HEAD WITHOUT CONTRAST MRA HEAD WITHOUT CONTRAST TECHNIQUE: Multiplanar, multiecho pulse sequences of the brain and surrounding structures were obtained without intravenous contrast. Angiographic images of the head were obtained using MRA technique without contrast. COMPARISON: 11/17/2013 CT. 09/24/2004 MR. FINDINGS: MRI HEAD FINDINGS Small subacute nonhemorrhagic right cerebellar infarct. No intracranial hemorrhage. Prominent progressive white matter type changes. Although several of the white matter changes involve the periventricular region, some of which appear perpendicular as can be seen with plaques related to multiple sclerosis (which cannot be excluded given the patient's age and gender), the fact that the patient has  multiple risk factors of atherosclerosis in addition to remote bilateral thalamic infarcts suggests that the white matter type changes may be related to result of small vessel disease. Other causes of white matter type changes such as that secondary to vasculitis or inflammatory process are secondary less likely considerations. No hydrocephalus. No intracranial mass lesion noted on this unenhanced exam. Exophthalmos. Cervical medullary junction, pituitary region and pineal region unremarkable. Opacification left ethmoid sinus air cell minimal paranasal sinus mucosal thickening otherwise noted. MRA HEAD FINDINGS Exam is motion degraded. Markedly tortuous vertical cervical segment of the internal carotid artery bilaterally. Ectatic cavernous segment right internal carotid artery. Artifact extends through the cavernous segment of the internal carotid arteries bilaterally. Moderate narrowing distal M1 segment/ bifurcation of the middle cerebral artery bilaterally. Artifact may partially contribute to this appearance. Moderate to marked narrowing A1 segment anterior cerebral artery more notable on the left. A2 segment anterior cerebral artery and middle cerebral artery distal branch vessel mild to moderate irregularity and narrowing bilaterally. Markedly ectatic vertebral arteries with fusiform dilation on the left. Moderate to marked narrowing distal left vertebral artery. Mild to moderate narrowing right vertebral artery. Non visualization right posterior inferior cerebellar artery. Narrowing left posterior inferior cerebellar artery. Moderate narrowing proximal basilar artery. Basilar artery beyond this region is ectatic. Nonvisualization anterior inferior cerebellar artery bilaterally. Narrowing of the superior cerebellar artery bilaterally. Long segment moderate to marked stenosis of the P2 segment of the right posterior cerebral artery. Mild to moderate narrowing P2 segment left posterior cerebral artery. Mild to  slightly moderate narrowing distal branches of the posterior cerebral artery more notable on left. No aneurysm or vascular malformation noted. IMPRESSION: MR brain: Small subacute nonhemorrhagic right cerebellar infarct. Prominent progressive white matter type changes. Although several of the white matter changes involve the periventricular region, some of which appear perpendicular as can be seen with plaques related to multiple sclerosis (which cannot be excluded given the patient's age and gender), the fact that the patient has multiple risk factors of atherosclerosis in addition to remote bilateral thalamic infarcts suggests that the white matter type changes may be related to result of small vessel disease. MR angiogram: Motion degraded with prominent intracranial atherosclerotic type changes as detailed above. Electronically Signed By: Bridgett Larsson M.D. On: 11/17/2013 18:37  US Carotid Duplex Bilateral  11/18/2013 CLINICAL DATA: Stroke EXAM: BILATERAL CAROTID DUPLEX ULTRASOUND TECHNIQUE: Wallace Cullens scale imaging, color Doppler and duplex ultrasound were performed of bilateral carotid and vertebral arteries in the neck. COMPARISON: None. FINDINGS: Criteria: Quantification of carotid stenosis is based on velocity parameters that correlate the  residual internal carotid diameter with NASCET-based stenosis levels, using the diameter of the distal internal carotid lumen as the denominator for stenosis measurement. The following velocity measurements were obtained: RIGHT ICA: 74 cm/sec CCA: 44 cm/sec SYSTOLIC ICA/CCA RATIO: [id number] DIASTOLIC ICA/CCA RATIO: ECA: 74 cm/sec LEFT ICA: 30 cm/sec CCA: 53 cm/sec SYSTOLIC ICA/CCA RATIO: [id number] DIASTOLIC ICA/CCA RATIO: ECA: 53 cm/sec RIGHT CAROTID ARTERY: Little if any plaque in the bulb. Low resistance internal carotid Doppler pattern. RIGHT VERTEBRAL ARTERY: Antegrade. Normal Doppler pattern. LEFT CAROTID ARTERY: Little if any plaque in the bulb. Low resistance internal carotid  Doppler pattern. LEFT VERTEBRAL ARTERY: Antegrade. Normal Doppler pattern. IMPRESSION: Less than 50% stenosis in the right and left internal carotid arteries. Electronically Signed By: Maryclare Bean M.D. On: 11/18/2013 09:01  Microbiology:  No results found for this or any previous visit (from the past 240 hour(s)).  Labs:  Basic Metabolic Panel:   Recent Labs  Lab  11/17/13 1216  11/20/13 0620   NA  139  138   K  3.6*  3.9   CL  100  101   CO2  26  25   GLUCOSE  365*  279*   BUN  11  23   CREATININE  0.80  0.93   CALCIUM  9.5  9.5   MG  --  1.6    Liver Function Tests:   Recent Labs  Lab  11/17/13 1216   AST  13   ALT  14   ALKPHOS  96   BILITOT  0.6   PROT  6.9   ALBUMIN  3.1*    No results found for this basename: LIPASE, AMYLASE, in the last 168 hours  No results found for this basename: AMMONIA, in the last 168 hours  CBC:   Recent Labs  Lab  11/17/13 1216  11/20/13 0620  11/21/13 0530   WBC  7.5  8.3  6.8   NEUTROABS  5.4  --  --   HGB  16.2*  15.9*  16.1*   HCT  46.2*  45.7  46.3*   MCV  92.8  94.4  93.9   PLT  202  189  186    Cardiac Enzymes:   Recent Labs  Lab  11/17/13 1216   TROPONINI  <0.30    BNP:  BNP (last 3 results)   Recent Labs   11/17/13 1354   PROBNP  506.2*    CBG:   Recent Labs  Lab  11/20/13 0752  11/20/13 1219  11/20/13 1655  11/20/13 2217  11/21/13 0735   GLUCAP  254*  260*  245*  257*  210*    Signed:  Dyanne Carrel M  [facility]  11/21/2013, 10:48 AM

## 2013-11-29 ENCOUNTER — Telehealth (HOSPITAL_COMMUNITY): Payer: Self-pay | Admitting: Dietician

## 2013-11-29 NOTE — Discharge Summary (Signed)
Patient seen, independently examined and chart reviewed. I agree with exam, assessment and plan discussed with Karen Black, NP.  Assessment/plan:  Plan discharge home today on aspirin, Plavix, Crestor  Cut Prozac in half as this can interact with Plavix and reduce efficacy. Consider weaning off this medication as an outpatient and alternative SSRI or antidepressant. I discussed this with the patient.  Discharge on Lantus 30 units Q24.  Will continue on newly added Lopressor (asymptomatic SVT), clonidine patch, Norvasc. Benazepril has been increased.  She should not be taking oral clonidine andTransdermal clonidine.  Recommend followup in one week for titration of blood pressure medications and glycemic control.  Malcome Ambrocio, MD [facility] [phone]   

## 2013-11-29 NOTE — Telephone Encounter (Signed)
Received referral from diabetes coordinator for outpatient diabetes education at [facility]. Mailed group DM class flyer, class information, and RD contact information to pt home so pt can schedule as desired.

## 2013-12-13 ENCOUNTER — Ambulatory Visit: Payer: PRIVATE HEALTH INSURANCE | Admitting: Family Medicine
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
## 2013-12-17 NOTE — Assessment & Plan Note (Signed)
Elevated LDL Hyperlipidemia:Low fat diet discussed and encouraged.   

## 2013-12-17 NOTE — Assessment & Plan Note (Signed)
Remains uncontrolled due tpo medical non compliancxe , not taking medication as prescribed. She is well aware of the very real dangerof this behavior.

## 2013-12-17 NOTE — Assessment & Plan Note (Signed)
Unchanged , again time spent attempting to motivate her to change this, she is an extremely ill pt , multiple uncontrolled illnesses which increase her risk of heart disease, stroke and renal failure, she is aware of this

## 2013-12-17 NOTE — Assessment & Plan Note (Signed)
Unchanged. Patient counseled for approximately 5 minutes regarding the health risks of ongoing nicotine use, specifically all types of cancer, heart disease, stroke and respiratory failure. The options available for help with cessation ,the behavioral changes to assist the process, and the option to either gradully reduce usage  Or abruptly stop.is also discussed. Pt is also encouraged to set specific goals in number of cigarettes used daily, as well as to set a quit date.  

## 2013-12-17 NOTE — Assessment & Plan Note (Addendum)
Deteriorated, has neither been taking medication prescribed, testing blood suagrs or keeping appt with endo who she is referred to. I again attempt to re educate pt re the need for her to take her health seriously to prevent further organ damage and increased risk of early debility or death Patient advised to reduce carb and sweets, commit to regular physical activity, take meds as prescribed, test blood as directed, and attempt to lose weight, to improve blood sugar control.

## 2013-12-22 IMAGING — CT CT HEAD W/O CM
1 series · 16 of 30 positions shown, 20 images · non-contrast
Comparison: 09/24/2004

CLINICAL DATA: Hypertension.  Diabetes.

CT HEAD WITHOUT CONTRAST
TECHNIQUE: Contiguous axial images were obtained from the base of
the skull through the vertex without contrast.

[Series 2: headseq 4.8 h37s · axial · 0.43mm/px · z∈[+123,+284]mm · 16 of 36 slices shown, 20 images]
[im 2/36  brain]
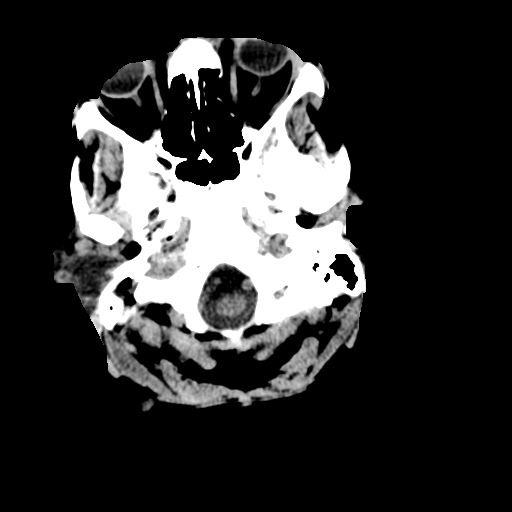
[im 2/36  bone]
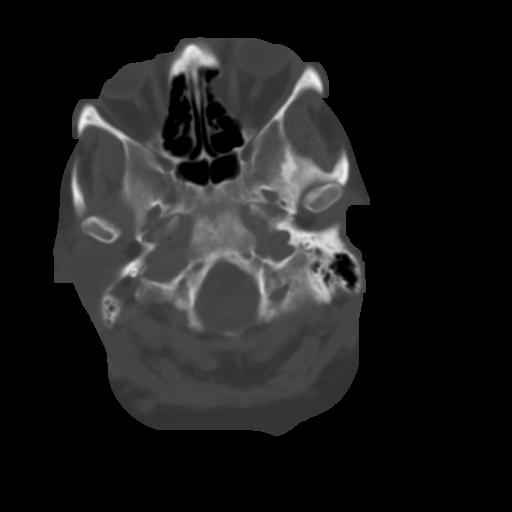
[im 4/36  brain]
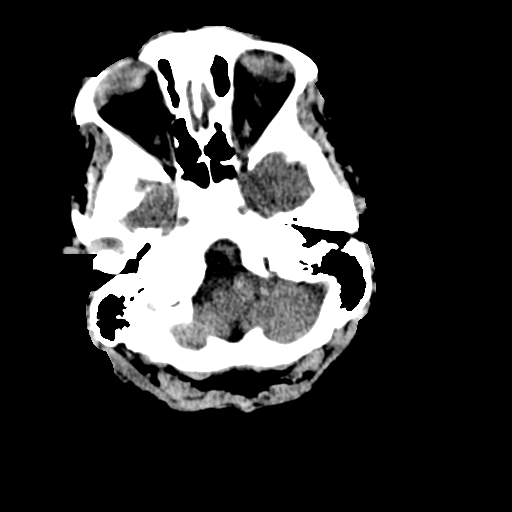
[im 7/36  brain]
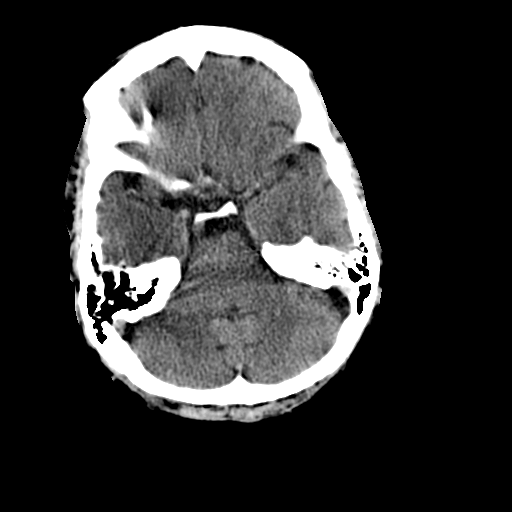
[im 9/36  brain]
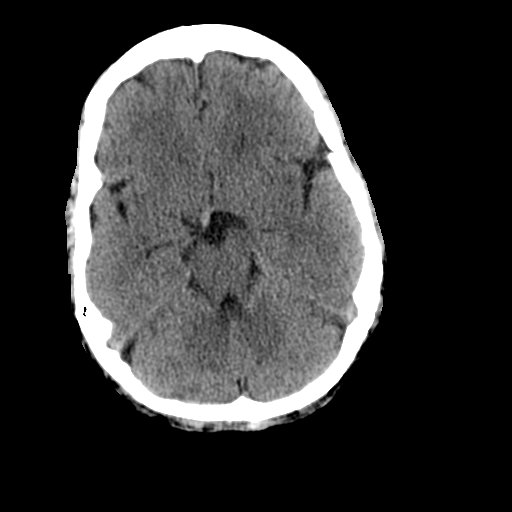
[im 10/36  brain]
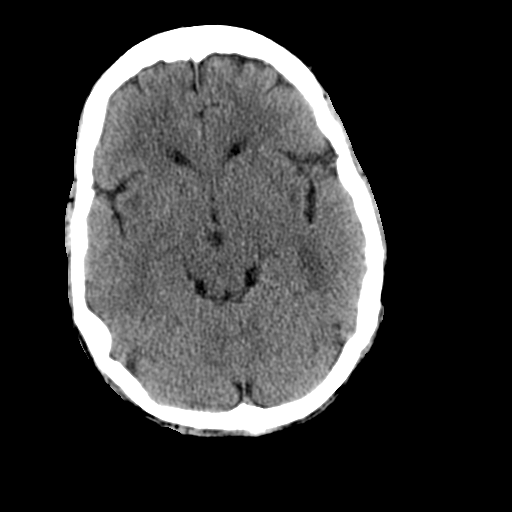
[im 10/36  bone]
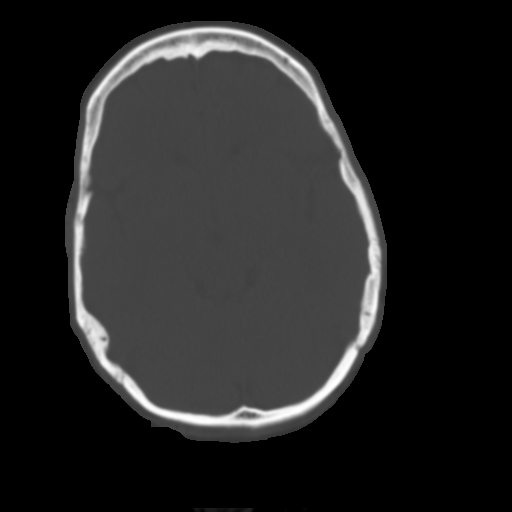
[im 13/36  brain]
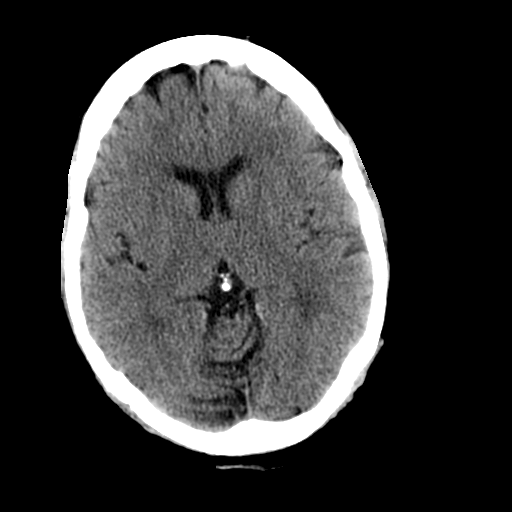
[im 15/36  brain]
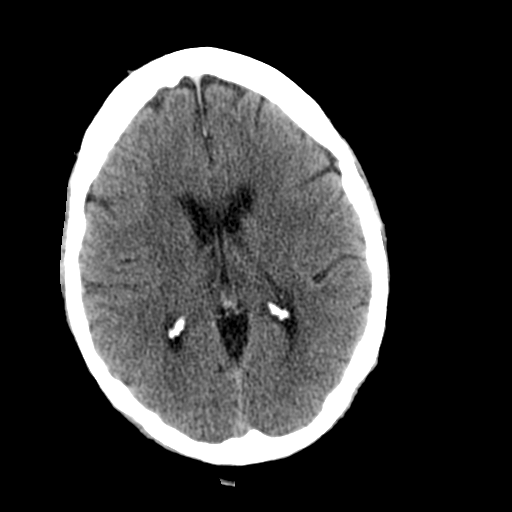
[im 17/36  brain]
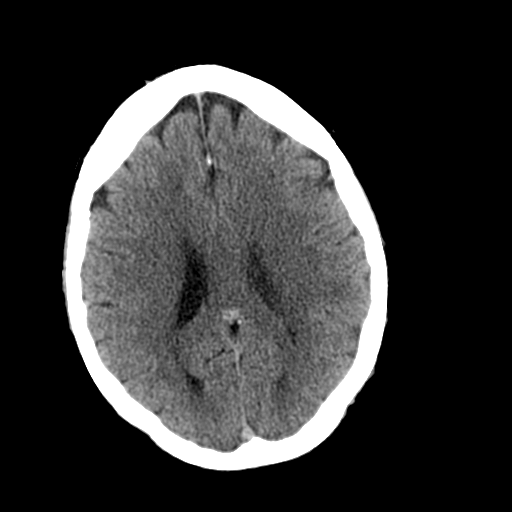
[im 19/36  brain]
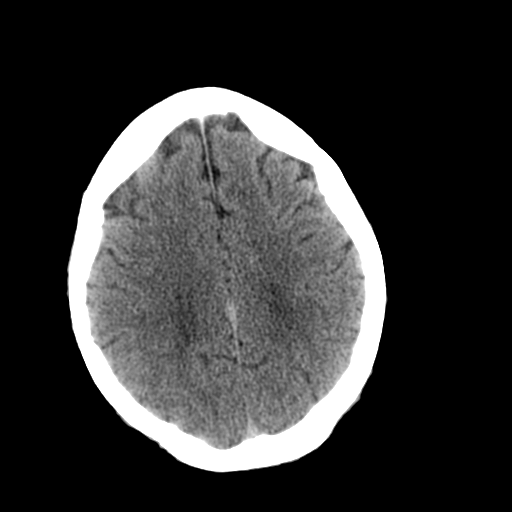
[im 19/36  bone]
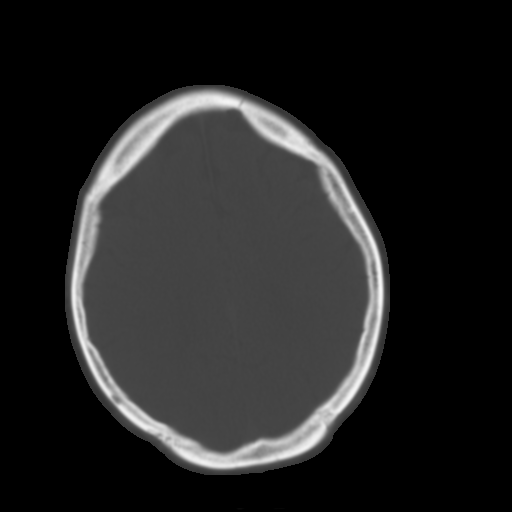
[im 21/36  brain]
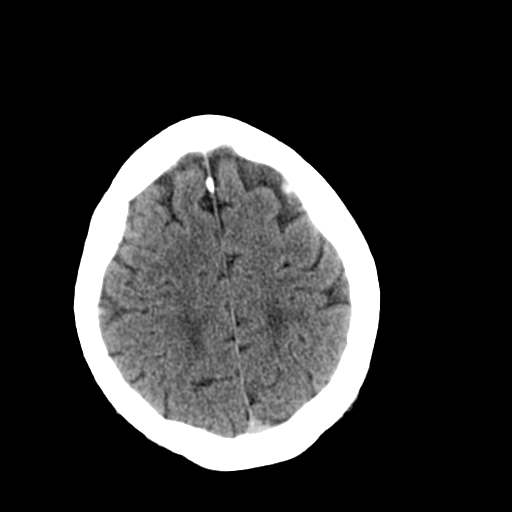
[im 23/36  brain]
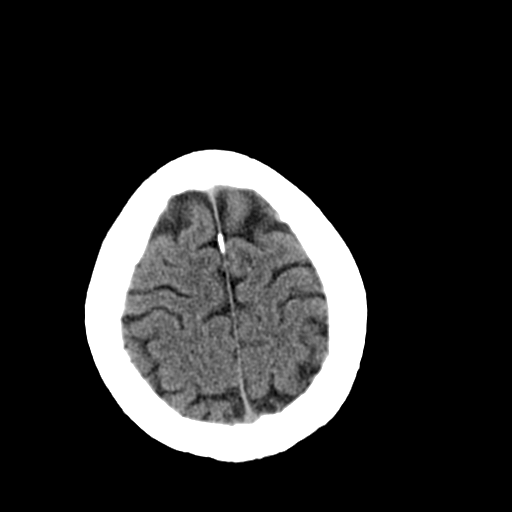
[im 26/36  brain]
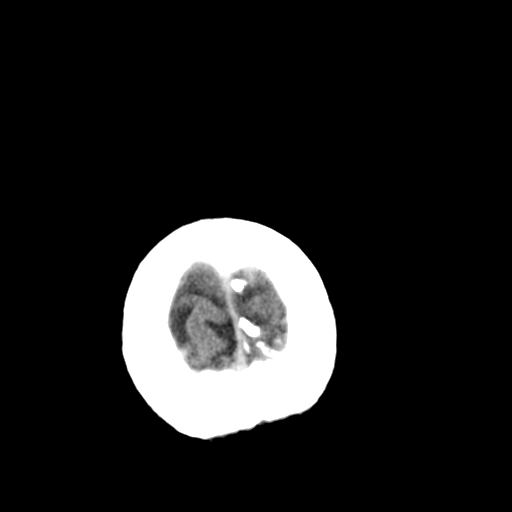
[im 27/36  brain]
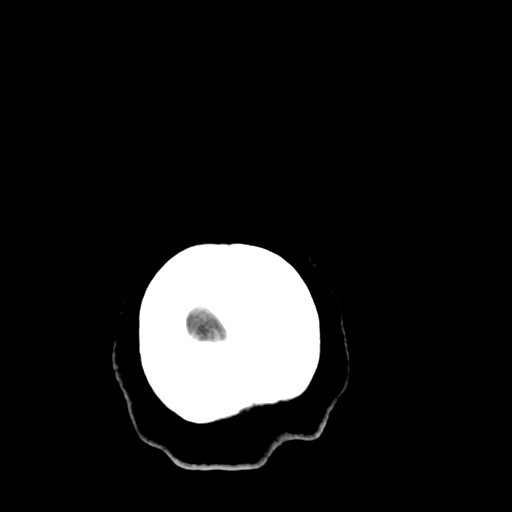
[im 27/36  bone]
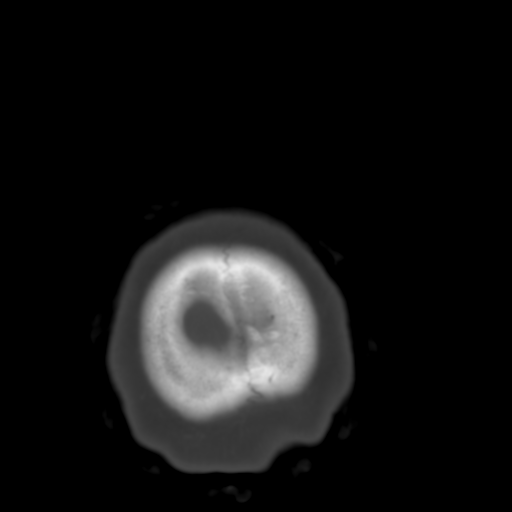
[im 29/36  brain]
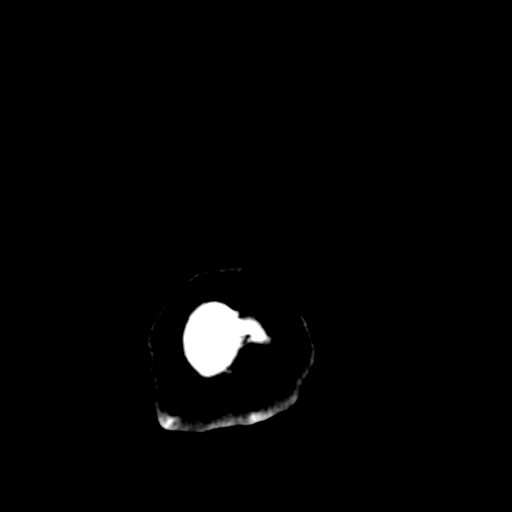
[im 32/36  brain]
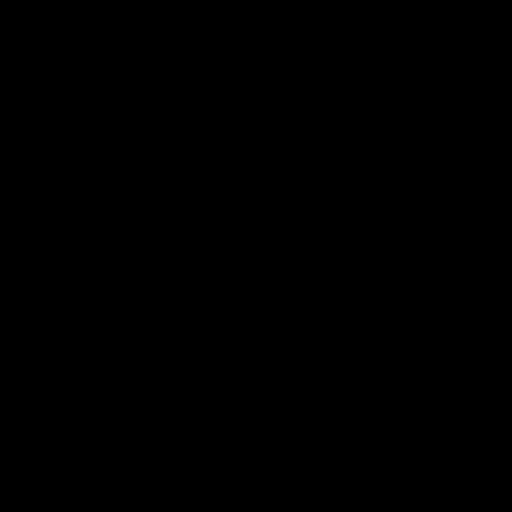
[im 34/36  brain]
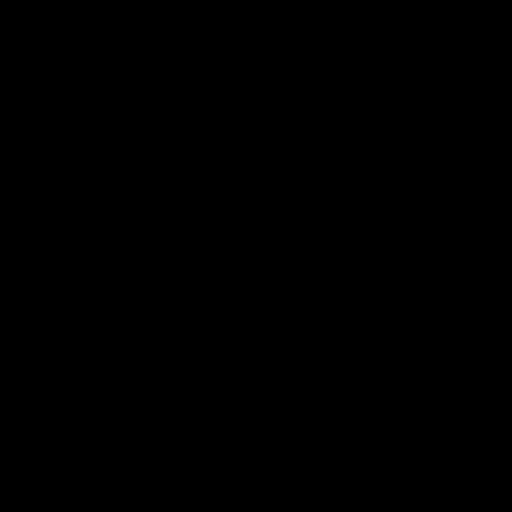

[16 of 30 positions shown; findings below may reference images not displayed]

FINDINGS: Stable dolichoectatic vertebrobasilar system noted.
Incidental note is made of a cavum septi pellucidi and cavum
vergae.

The brain stem, cerebellum, cerebral peduncles, thalami, basal
ganglia, basilar cisterns, and ventricular system appear
unremarkable.

Periventricular and corona radiata white matter hypodensities are
most compatible with chronic ischemic microvascular white matter
disease.

No intracranial hemorrhage, mass lesion, or acute infarction is
identified.

Old left medial orbital wall fracture noted.
IMPRESSION: 1.  No acute intracranial findings.
2.  Old left medial orbital wall fracture.
3.  Periventricular and corona radiata white matter hypodensities
are most compatible with chronic ischemic microvascular white
matter disease.

## 2013-12-27 ENCOUNTER — Ambulatory Visit: Payer: PRIVATE HEALTH INSURANCE | Admitting: Family Medicine

## 2013-12-29 ENCOUNTER — Other Ambulatory Visit: Payer: Self-pay

## 2013-12-29 MED ORDER — BENAZEPRIL HCL 40 MG PO TABS
40.0000 mg | ORAL_TABLET | Freq: Every day | ORAL | Status: DC
Start: 2013-12-29 — End: 2014-05-24

## 2013-12-29 MED ORDER — CLOPIDOGREL BISULFATE 75 MG PO TABS: 75.0000 mg | ORAL_TABLET | Freq: Every day | ORAL | Status: DC

## 2013-12-30 IMAGING — US US ASPIRATION
1 series · 4 of 4 positions shown · non-contrast
Comparison: 12/16/2011, 03/26/2011

***ADDENDUM*** CREATED: 06/15/2012 [DATE]

Gram stain demonstrates no white blood cells, organisms or
epithelial cells.  Culture demonstrates moderate
peptostreptococcus.
BI-RADS CATEGORY 2:  Benign finding(s).
***END ADDENDUM*** SIGNED BY: Blade Aujla, M.D.
CLINICAL DATA: The patient returns for evaluation of a subareolar
mass in the left breast noted on recent screening mammogram dated
05/19/2012.
DIGITAL DIAGNOSTIC LEFT MAMMOGRAM , LEFT BREAST ULTRASOUND AND
ULTRASOUND-GUIDED ASPIRATION OF THE LEFT BREAST:

[Series 1: us aspiration · 0.08mm/px · 4 of 4 slices shown]
[im 1/4]
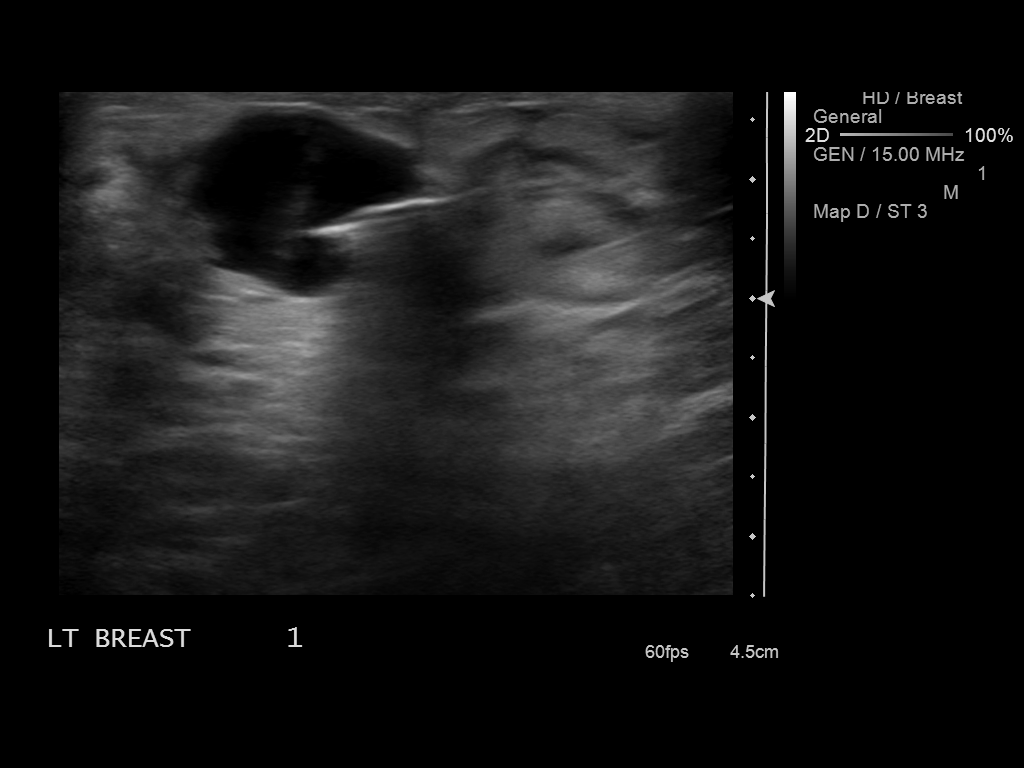
[im 2/4]
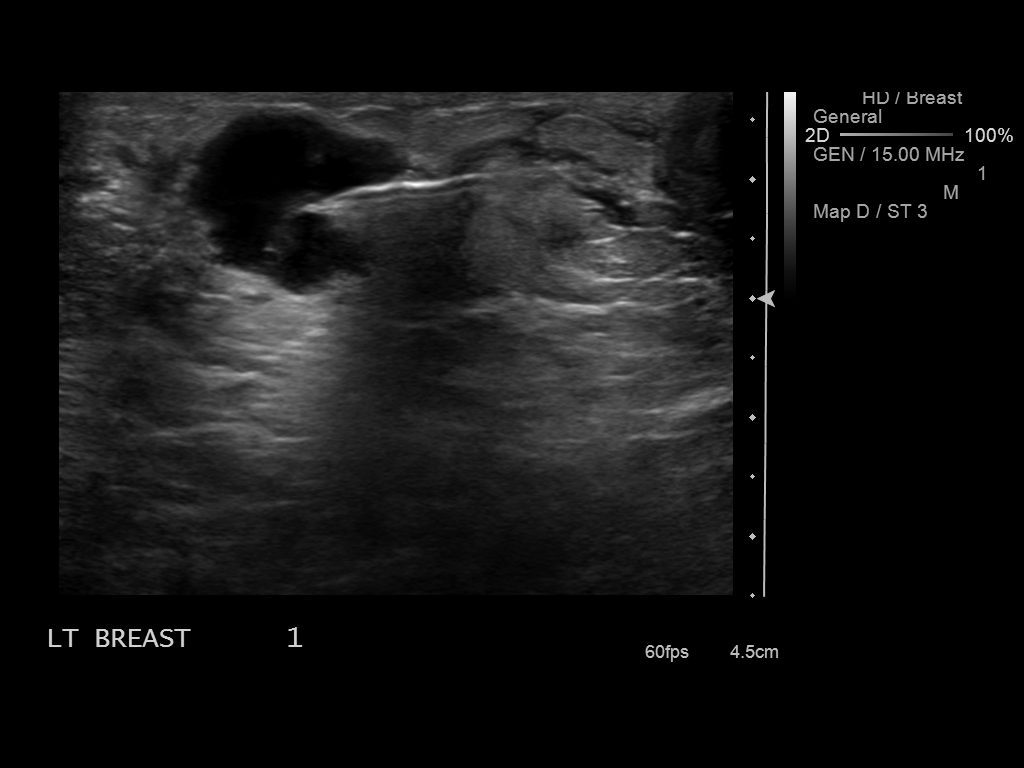
[im 3/4]
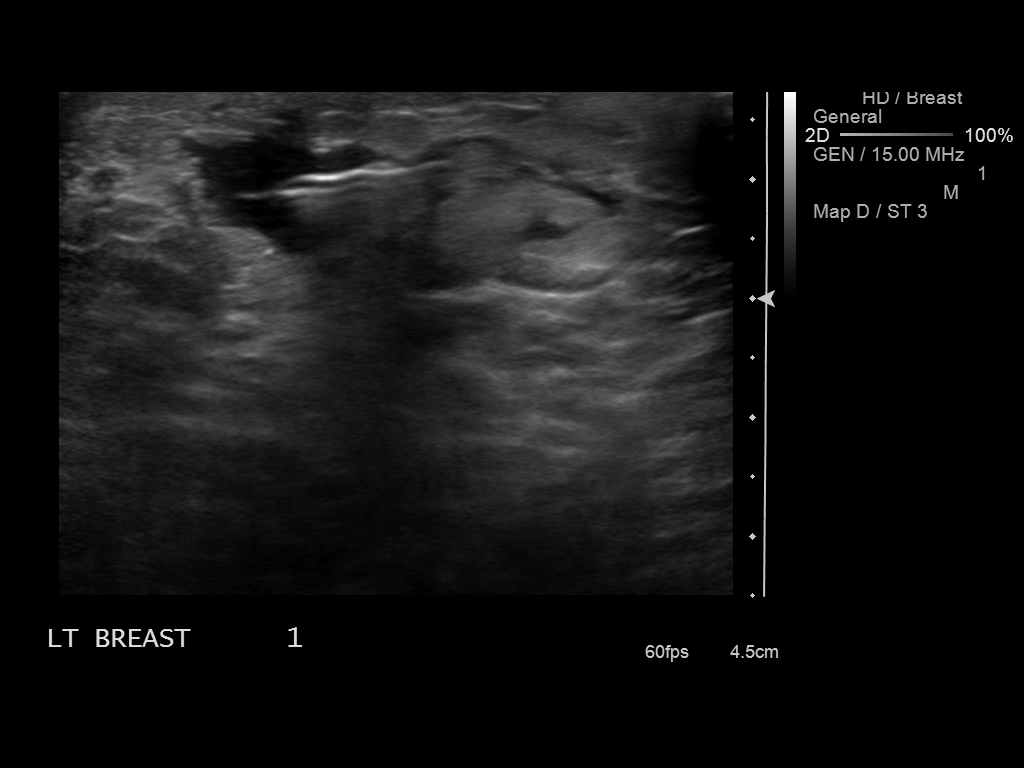
[im 4/4]
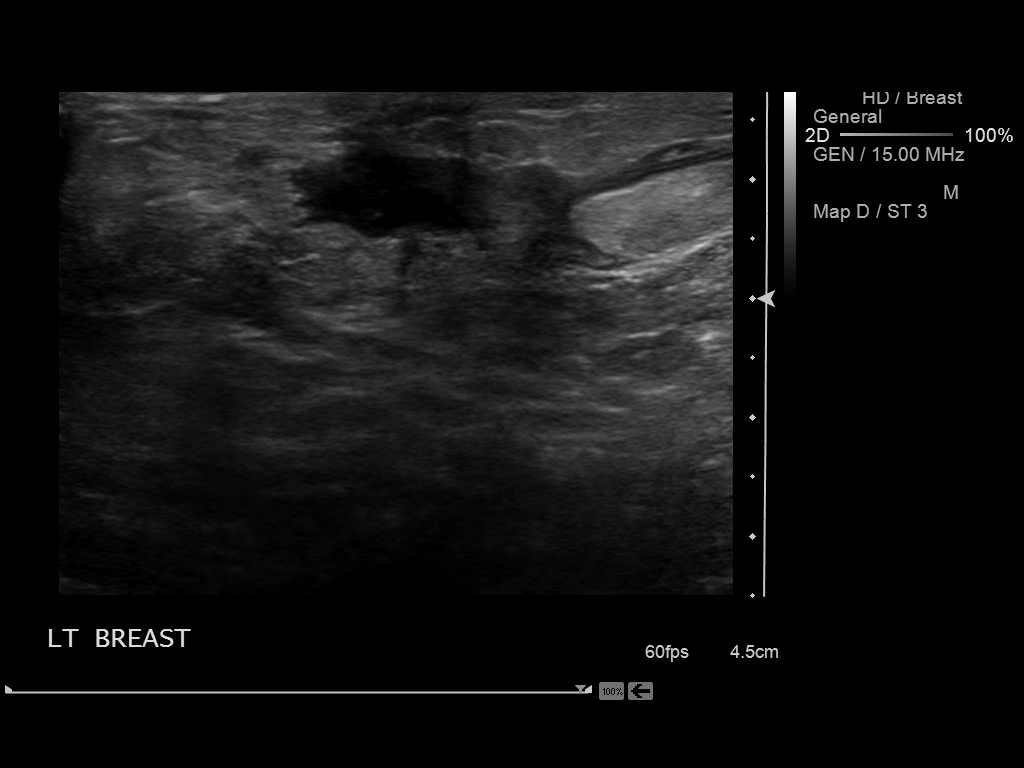

[4 of 4 positions shown; findings below may reference images not displayed]

FINDINGS: Spot compression views confirm the presence of an oval
subareolar mass in the left breast.

On physical exam, I palpate a firm 3 cm subareolar mass at 7
o'clock in the left breast.

Ultrasound is performed, showing a complex fluid collection at 7
o'clock in the left subareolar region measuring 2.6 x 2.5 x 1.8 cm.
This is consistent with an abscess.

Aspiration is suggested.  This was discussed with the patient and
she agreed with this plan. The procedure was explained and written
consent was granted.

Using sterile technique, 2% lidocaine for local anesthesia, and
ultrasound guidance, an 18 gauge needle was placed within the fluid
collection at 7 o'clock in the left subareolar region.
Approximately 3 ml of pus was withdrawn and sent for Gram stain and
culture.  The abscess decreased in size.  It is not possible to
completely evacuate the abscess.  A course of antibiotics is
recommended.
IMPRESSION: Left subareolar abscess. This was aspirated of 3 ml of pus which
was sent for Gram stain and culture.

RECOMMENDATION:
A course of antibiotics is recommended.  Report was discussed with
Dr. Francesca who will call in an antibiotic prescription for the
patient. Clinical follow-up is recommended.  If this does not
resolve, one may wish to consider surgical consultation for
drainage.

Yearly screening mammography is suggested.

BI-RADS CATEGORY 2:  Benign finding(s).

## 2014-01-04 ENCOUNTER — Emergency Department (HOSPITAL_COMMUNITY): Payer: PRIVATE HEALTH INSURANCE

## 2014-01-04 ENCOUNTER — Encounter (HOSPITAL_COMMUNITY): Payer: Self-pay | Admitting: Emergency Medicine

## 2014-01-04 ENCOUNTER — Emergency Department (HOSPITAL_COMMUNITY)
Admission: EM | Admit: 2014-01-04 | Discharge: 2014-01-04 | Disposition: A | Discharge status: Home / Self Care | Payer: PRIVATE HEALTH INSURANCE | Attending: Emergency Medicine | Admitting: Emergency Medicine

## 2014-01-04 DIAGNOSIS — F410 Panic disorder [episodic paroxysmal anxiety] without agoraphobia: Secondary | ICD-10-CM | POA: Insufficient documentation

## 2014-01-04 DIAGNOSIS — I428 Other cardiomyopathies: Secondary | ICD-10-CM | POA: Insufficient documentation

## 2014-01-04 DIAGNOSIS — M549 Dorsalgia, unspecified: Secondary | ICD-10-CM

## 2014-01-04 DIAGNOSIS — Z794 Long term (current) use of insulin: Secondary | ICD-10-CM | POA: Insufficient documentation

## 2014-01-04 DIAGNOSIS — Z8781 Personal history of (healed) traumatic fracture: Secondary | ICD-10-CM | POA: Insufficient documentation

## 2014-01-04 DIAGNOSIS — Z8719 Personal history of other diseases of the digestive system: Secondary | ICD-10-CM | POA: Insufficient documentation

## 2014-01-04 DIAGNOSIS — Y9389 Activity, other specified: Secondary | ICD-10-CM | POA: Insufficient documentation

## 2014-01-04 DIAGNOSIS — N63 Unspecified lump in unspecified breast: Secondary | ICD-10-CM | POA: Insufficient documentation

## 2014-01-04 DIAGNOSIS — IMO0002 Reserved for concepts with insufficient information to code with codable children: Secondary | ICD-10-CM | POA: Insufficient documentation

## 2014-01-04 DIAGNOSIS — Z7902 Long term (current) use of antithrombotics/antiplatelets: Secondary | ICD-10-CM | POA: Insufficient documentation

## 2014-01-04 DIAGNOSIS — Z79899 Other long term (current) drug therapy: Secondary | ICD-10-CM | POA: Insufficient documentation

## 2014-01-04 DIAGNOSIS — N6320 Unspecified lump in the left breast, unspecified quadrant: Secondary | ICD-10-CM

## 2014-01-04 DIAGNOSIS — Z7982 Long term (current) use of aspirin: Secondary | ICD-10-CM | POA: Insufficient documentation

## 2014-01-04 DIAGNOSIS — E1149 Type 2 diabetes mellitus with other diabetic neurological complication: Secondary | ICD-10-CM | POA: Insufficient documentation

## 2014-01-04 DIAGNOSIS — Y9289 Other specified places as the place of occurrence of the external cause: Secondary | ICD-10-CM | POA: Insufficient documentation

## 2014-01-04 DIAGNOSIS — F17200 Nicotine dependence, unspecified, uncomplicated: Secondary | ICD-10-CM | POA: Insufficient documentation

## 2014-01-04 DIAGNOSIS — I119 Hypertensive heart disease without heart failure: Secondary | ICD-10-CM | POA: Insufficient documentation

## 2014-01-04 DIAGNOSIS — S4980XA Other specified injuries of shoulder and upper arm, unspecified arm, initial encounter: Secondary | ICD-10-CM | POA: Insufficient documentation

## 2014-01-04 DIAGNOSIS — E785 Hyperlipidemia, unspecified: Secondary | ICD-10-CM | POA: Insufficient documentation

## 2014-01-04 DIAGNOSIS — Z3202 Encounter for pregnancy test, result negative: Secondary | ICD-10-CM | POA: Insufficient documentation

## 2014-01-04 DIAGNOSIS — E669 Obesity, unspecified: Secondary | ICD-10-CM | POA: Insufficient documentation

## 2014-01-04 DIAGNOSIS — S298XXA Other specified injuries of thorax, initial encounter: Secondary | ICD-10-CM | POA: Insufficient documentation

## 2014-01-04 DIAGNOSIS — S46909A Unspecified injury of unspecified muscle, fascia and tendon at shoulder and upper arm level, unspecified arm, initial encounter: Secondary | ICD-10-CM | POA: Insufficient documentation

## 2014-01-04 LAB — POC URINE PREG, ED: Preg Test, Ur: NEGATIVE

## 2014-01-04 LAB — CBG MONITORING, ED: Glucose-Capillary: 264 mg/dL — ABNORMAL HIGH (ref 70–99)

## 2014-01-04 MED ORDER — HYDROCODONE-ACETAMINOPHEN 5-325 MG PO TABS: ORAL_TABLET | ORAL | Status: DC

## 2014-01-04 MED ORDER — OXYCODONE-ACETAMINOPHEN 5-325 MG PO TABS
1.0000 | ORAL_TABLET | Freq: Once | ORAL | Status: AC
  Administered 2014-01-04: 1 via ORAL
  Filled 2014-01-04: qty 1
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
## 2014-01-04 NOTE — ED Notes (Signed)
Pt ate a meal while waiting for U/S results.
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
## 2014-01-04 NOTE — ED Notes (Signed)
Pt asking for pain med. Advised that PA needed to see her first

## 2014-01-04 NOTE — Discharge Instructions (Signed)
Back Exercises Back exercises help treat and prevent back injuries. The goal is to increase your strength in your belly (abdominal) and back muscles. These exercises can also help with flexibility. Start these exercises when told by your doctor. HOME CARE Back exercises include: Pelvic Tilt.  Lie on your back with your knees bent. Tilt your pelvis until the lower part of your back is against the floor. Hold this position 5 to 10 sec. Repeat this exercise 5 to 10 times. Knee to Chest.  Pull 1 knee up against your chest and hold for 20 to 30 seconds. Repeat this with the other knee. This may be done with the other leg straight or bent, whichever feels better. Then, pull both knees up against your chest. Sit-Ups or Curl-Ups.  Bend your knees 90 degrees. Start with tilting your pelvis, and do a partial, slow sit-up. Only lift your upper half 30 to 45 degrees off the floor. Take at least 2 to 3 seonds for each sit-up. Do not do sit-ups with your knees out straight. If partial sit-ups are difficult, simply do the above but with only tightening your belly (abdominal) muscles and holding it as told. Hip-Lift.  Lie on your back with your knees flexed 90 degrees. Push down with your feet and shoulders as you raise your hips 2 inches off the floor. Hold for 10 seconds, repeat 5 to 10 times. Back Arches.  Lie on your stomach. Prop yourself up on bent elbows. Slowly press on your hands, causing an arch in your low back. Repeat 3 to 5 times. Shoulder-Lifts.  Lie face down with arms beside your body. Keep hips and belly pressed to floor as you slowly lift your head and shoulders off the floor. Do not overdo your exercises. Be careful in the beginning. Exercises may cause you some mild back discomfort. If the pain lasts for more than 15 minutes, stop the exercises until you see your doctor. Improvement with exercise for back problems is slow.  Document Released: 11/14/2010 Document Revised: 01/04/2012  Document Reviewed: 08/13/2011 [facility] Patient Information 2014 [place], [place].  Breast Biopsy A breast biopsy is a procedure where a sample of breast tissue is removed from your breast. The tissue is examined under a microscope to see if cancerous cells are present. A breast biopsy is done when there is:  Any undiagnosed breast mass (tumor).  Nipple abnormalities, dimpling, crusting, or ulcerations.  Abnormal discharge from the nipple, especially blood.  Redness, swelling, and pain of the breast.  Calcium deposits (calcifications) or abnormalities seen on a mammogram, ultrasound result, or results of magnetic resonance imaging (MRI).  Suspicious changes in the breast seen on your mammogram. If the tumor is found to be cancerous (malignant), a breast biopsy can help to determine what the best treatment is for you. There are many different types of breast biopsies. Talk to your caregiver about your options and which type is best for you. LET YOUR CAREGIVER KNOW ABOUT:  Allergies to food or medicine.  Medicines taken, including vitamins, herbs, eyedrops, over-the-counter medicines, and creams.  Use of steroids (by mouth or creams).  Previous problems with anesthetics or numbing medicines.  History of bleeding problems or blood clots.  Previous surgery.  Other health problems, including diabetes and kidney problems.  Any recent colds or infections.  Possibility of pregnancy, if this applies. RISKS AND COMPLICATIONS   Bleeding.  Infection.  Allergy to medicines.  Bruising and swelling of the breast.  Alteration in the shape of the  breast.  Not finding the lump or abnormality.  Needing more surgery. BEFORE THE PROCEDURE  Arrange for someone to drive you home after the procedure.  Do not smoke for 2 weeks before the procedure. Stop smoking, if you smoke.  Do not drink alcohol for 24 hours before procedure.  Wear a good support bra to the procedure. PROCEDURE    You may be given a medicine to numb the breast area (local anesthesia) or a medicine to make you sleep (general anesthesia) during the procedure. The following are the different types of biopsies that can be performed.   Fine-needle aspiration A thin needle is attached to a syringe and inserted into the breast lump. Fluid and cells are removed and then looked at under a microscope. If the breast lump cannot be felt, an ultrasound may be used to help locate the lump and place the needle in the correct area.   Core needle biopsy A wide, hollow needle (core needle) is inserted into the breast lump 3 6 times to get tissue samples or cores. The samples are removed. The needle is usually placed in the correct area by using an ultrasound or X-ray.   Stereotactic biopsy X-ray equipment and a computer are used to analyze X-ray pictures of the breast lump. The computer then finds exactly where the core needle needs to be inserted. Tissue samples are removed.   Vacuum-assisted biopsy A small incision (less than  inch) is made in your breast. A biopsy device that includes a hollow needle and vacuum is passed through the incision and into the breast tissue. The vacuum gently draws abnormal breast tissue into the needle to remove it. This type of biopsy removes a larger tissue sample than a regular core needle biopsy. No stitches are needed, and there is usually little scarring.  Ultrasound-guided core needle biopsy A high frequency ultrasound helps guide the core needle to the area of the mass or abnormality. An incision is made to insert the needle. Tissue samples are removed.  Open biopsy A larger incision is made in the breast. Your caregiver will attempt to remove the whole breast lump or as much as possible. AFTER THE PROCEDURE  You will be taken to the recovery area. If you are doing well and have no problems, you will be allowed to go home.  You may notice bruising on your breast. This is  normal.  Your caregiver may apply a pressure dressing on your breast for 24 48 hours. A pressure dressing is a bandage that is wrapped tightly around the chest to stop fluid from collecting underneath tissues. Document Released: 10/12/2005 Document Revised: 02/06/2013 Document Reviewed: 11/12/2011 [facility] Patient Information 2014 [place].
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
## 2014-01-06 IMAGING — MR MR SHOULDER*R* W/O CM
7 series · 39 of 40 positions shown · non-contrast
Comparison: None

CLINICAL DATA: Right shoulder pain and numbness.

MRI OF THE RIGHT SHOULDER WITHOUT CONTRAST
TECHNIQUE: Multiplanar, multisequence MR imaging of the right
shoulder was performed.  No intravenous contrast was administered.

[Series 4: t2fs axial · axial · 4.0mm · 0.16mm/px · z∈[-36,+44]mm · 6 of 22 slices shown]
[im 1/22]
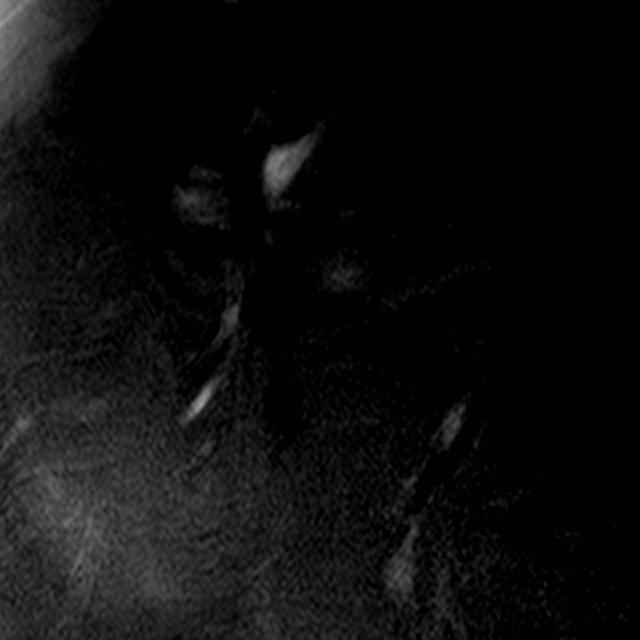
[im 5/22]
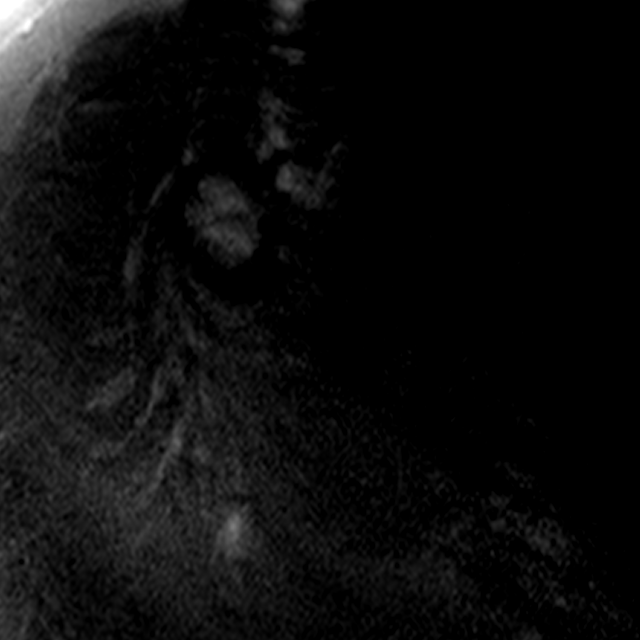
[im 9/22]
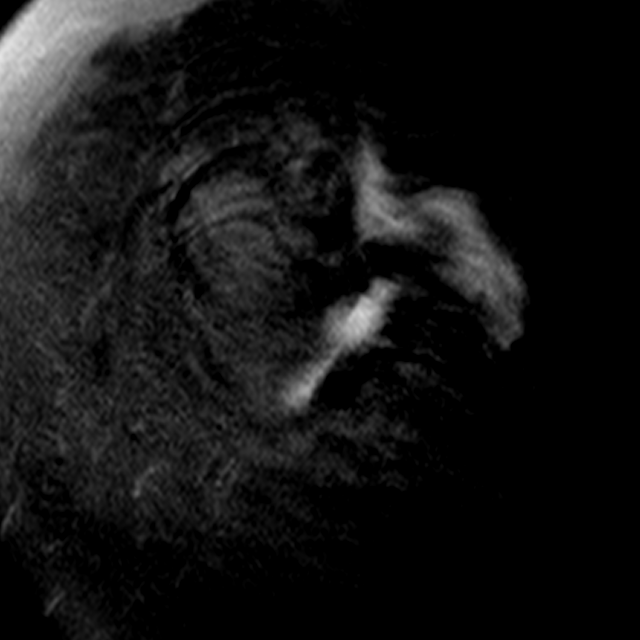
[im 13/22]
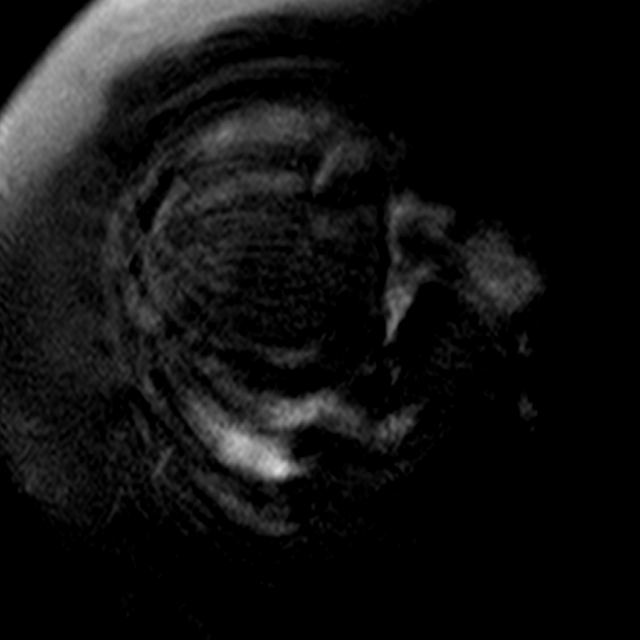
[im 17/22]
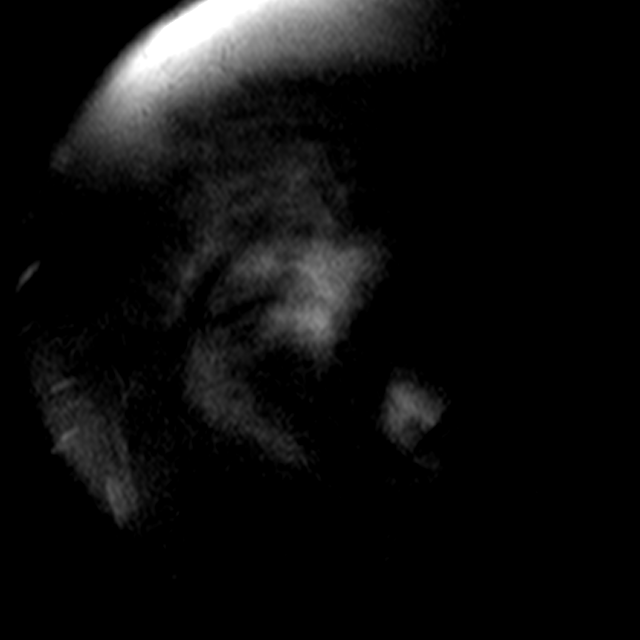
[im 22/22]
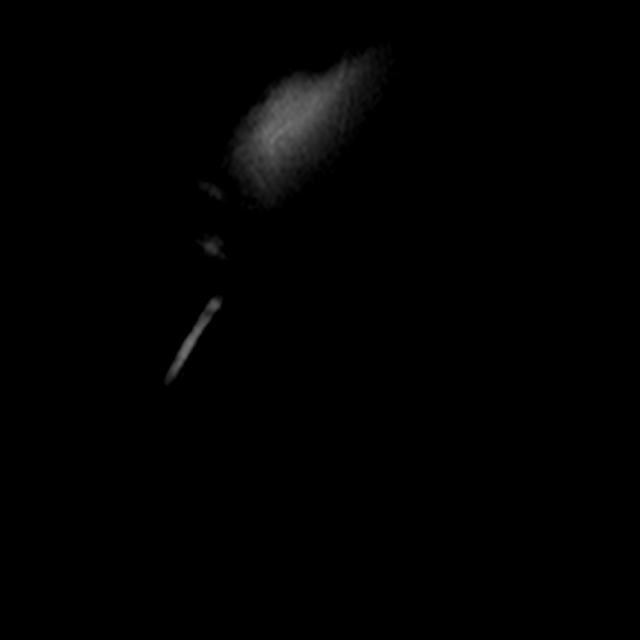

[Series 5: t2fs blade axial · axial · 4.0mm · 0.46mm/px · z∈[-38,+43]mm · 6 of 22 slices shown (1 of 2)]
[im 1/22]
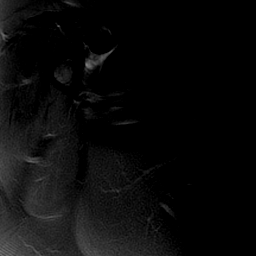
[im 5/22]
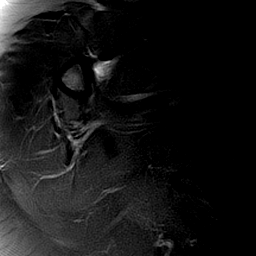
[im 9/22]
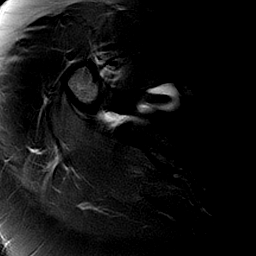
[im 13/22]
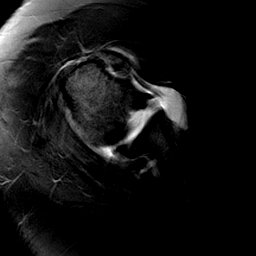
[im 17/22]
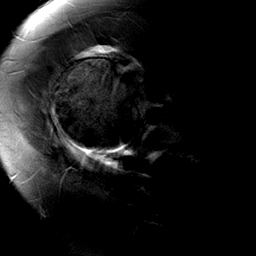
[im 22/22]
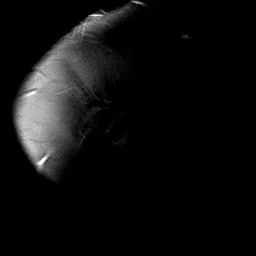

[Series 6: t2fs blade coronal · oblique · 4.0mm · 0.40mm/px · 4 of 16 slices shown]
[im 1/16]
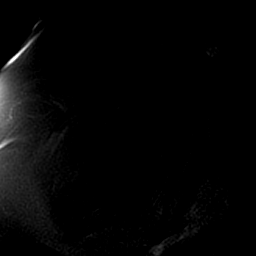
[im 6/16]
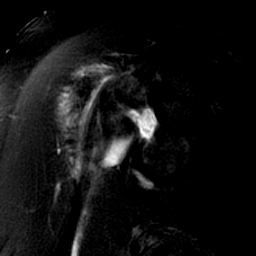
[im 11/16]
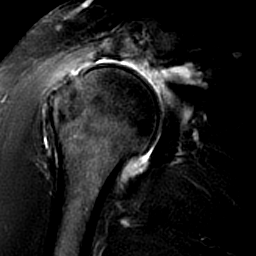
[im 16/16]
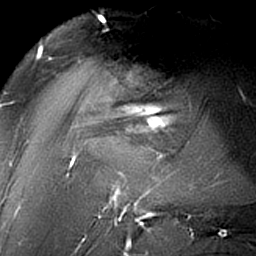

[Series 7: PD · oblique · 4.0mm · 0.39mm/px · 4 of 16 slices shown]
[im 1/16]
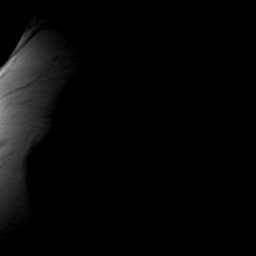
[im 6/16]
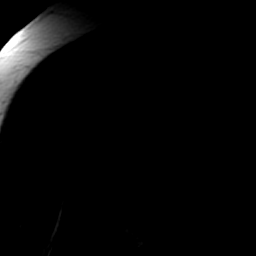
[im 11/16]
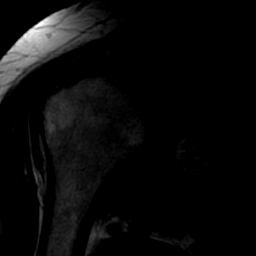
[im 16/16]
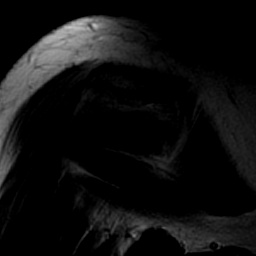

[Series 8: T1 · coronal · 4.0mm · 0.42mm/px · 7 of 24 slices shown]
[im 1/24]
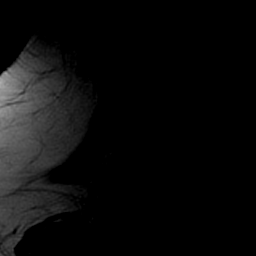
[im 4/24]
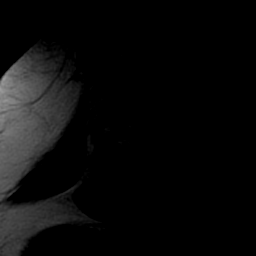
[im 8/24]
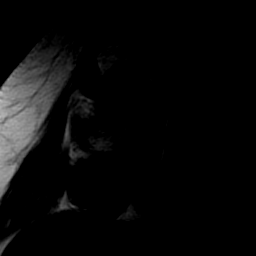
[im 12/24]
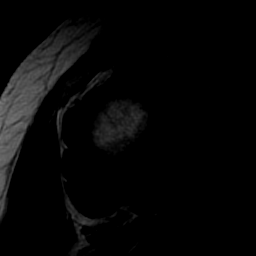
[im 16/24]
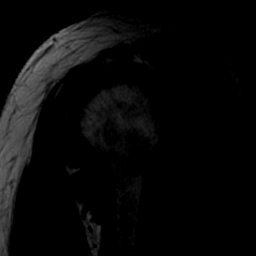
[im 20/24]
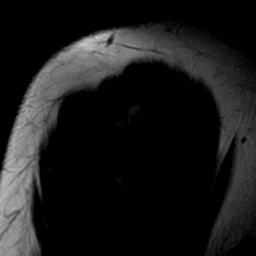
[im 24/24]
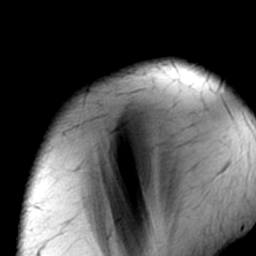

[Series 9: t2fs blade sagital · coronal · 4.0mm · 0.39mm/px · 7 of 24 slices shown]
[im 1/24]
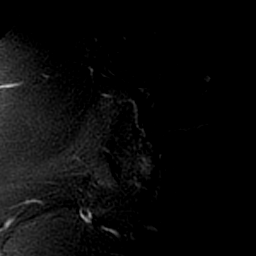
[im 4/24]
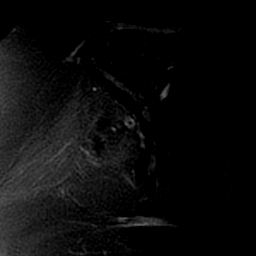
[im 8/24]
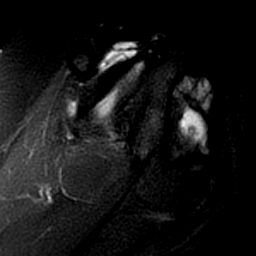
[im 12/24]
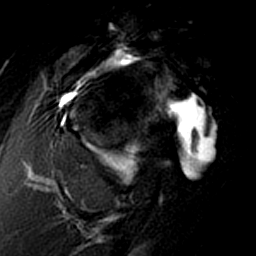
[im 16/24]
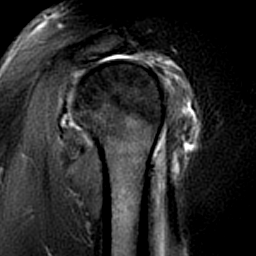
[im 20/24]
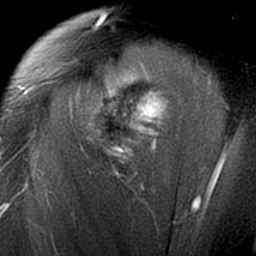
[im 24/24]
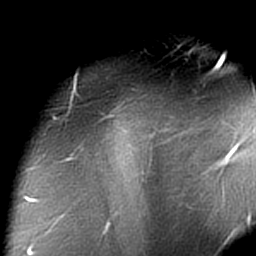

[Series 10: t2fs blade axial · axial · 4.0mm · 0.45mm/px · z∈[-3,+54]mm · 5 of 20 slices shown (2 of 2)]
[im 1/20]
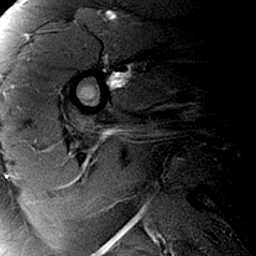
[im 4/20]
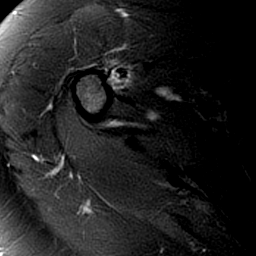
[im 8/20]
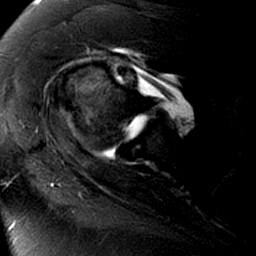
[im 12/20]
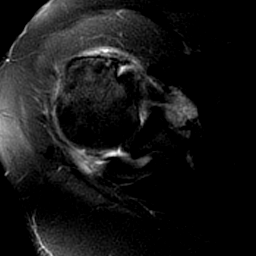
[im 16/20]
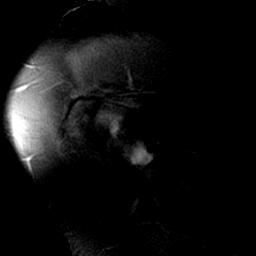

[39 of 40 positions shown; findings below may reference images not displayed]

FINDINGS: Rotator cuff:  The supraspinatus and subscapularis tendons are
completely torn.  The supraspinatus is retracted medial to the top
of the humeral head, 4-5 cm.  There is 3-4 cm retraction of the
subscapularis. The infraspinatus is nearly completely torn.  The
posterior most fibers may be intact.  Retraction is estimated at 3-
4 cm.  The teres minor is unremarkable.
Muscles:  There is marked fatty atrophy of the subscapularis,
supraspinatus and infraspinatus.
Biceps long head:  There is tendinopathy with intrasubstance
increased T2 signal identified but the tendon appears intact.

Acromioclavicular Joint:  Bulky acromioclavicular degenerative
change is present.
Glenohumeral Joint:  The humeral head is posteriorly subluxed on
the glenoid consistent with glenohumeral joint instability.

Labrum:  The superior labrum appears severely degenerated.  The
anterior labrum is not markedly diminutive throughout also likely
related to degeneration.
Bones:  The acromion is type 1 with subacromial spurring.  Fluid is
present subacromial/subdeltoid bursa.

Please note that the study is degraded by patient motion.
IMPRESSION: 1.  Complete supraspinatus and subscapularis tendon tears with near
complete infraspinatus tear also identified.  There is marked
atrophy of all muscle bellies and tendons are retracted described
above.
2.  Bulky acromioclavicular degenerative change.  Subacromial spur
also noted.
3.  Severely degenerated superior and anterior labrum.
4.  Subacromial/subdeltoid bursitis.
5.  Posterior subluxation of the humeral head on the glenoid bone
is compatible with glenohumeral joint instability.
6.  Tendinosis of the long head biceps without tear.

## 2014-01-08 ENCOUNTER — Other Ambulatory Visit: Payer: Self-pay | Admitting: Family Medicine

## 2014-01-08 NOTE — ED Provider Notes (Signed)
Medical screening examination/treatment/procedure(s) were performed by non-physician practitioner and as supervising physician I was immediately available for consultation/collaboration.   EKG Interpretation None        Alfonzo Feller, DO 01/08/14 1820

## 2014-01-10 ENCOUNTER — Other Ambulatory Visit: Payer: Self-pay

## 2014-01-10 ENCOUNTER — Ambulatory Visit (HOSPITAL_COMMUNITY)
Admission: RE | Admit: 2014-01-10 | Discharge: 2014-01-10 | Disposition: A | Discharge status: Home / Self Care | Payer: PRIVATE HEALTH INSURANCE | Source: Ambulatory Visit | Attending: Emergency Medicine | Admitting: Emergency Medicine

## 2014-01-10 ENCOUNTER — Telehealth: Payer: Self-pay | Admitting: Family Medicine

## 2014-01-10 DIAGNOSIS — E119 Type 2 diabetes mellitus without complications: Secondary | ICD-10-CM | POA: Insufficient documentation

## 2014-01-10 DIAGNOSIS — N63 Unspecified lump in unspecified breast: Secondary | ICD-10-CM | POA: Insufficient documentation

## 2014-01-10 DIAGNOSIS — N611 Abscess of the breast and nipple: Secondary | ICD-10-CM

## 2014-01-10 MED ORDER — DOXYCYCLINE HYCLATE 100 MG PO TABS: 100.0000 mg | ORAL_TABLET | Freq: Two times a day (BID) | ORAL | Status: DC

## 2014-01-10 NOTE — Telephone Encounter (Signed)
Pls contact pt urgently let her know radiology saw what looks like another abscess in her left breast. Pls send in and let her know she nEED to start the antibioitic doxycycline 100mg  one twice daily #20 and mostimportantly needs to see surgeon asap for consultation and drainage. Pls enter referral to Dr Arnoldo Morale, spk with referral staff and get appt  Set up . Send imaging report also

## 2014-01-10 NOTE — Telephone Encounter (Signed)
[facility] number is [phone]

## 2014-01-10 NOTE — Addendum Note (Signed)
Addended by: Eual Fines on: 01/10/2014 02:22 PM   Modules accepted: Orders

## 2014-01-10 NOTE — Telephone Encounter (Signed)
Patient aware and referral entered   

## 2014-01-11 ENCOUNTER — Ambulatory Visit: Payer: PRIVATE HEALTH INSURANCE | Admitting: Family Medicine

## 2014-01-11 ENCOUNTER — Encounter: Payer: Self-pay | Admitting: Family Medicine

## 2014-01-19 ENCOUNTER — Other Ambulatory Visit: Payer: Self-pay

## 2014-01-19 ENCOUNTER — Telehealth: Payer: Self-pay | Admitting: Family Medicine

## 2014-01-19 MED ORDER — DOXYCYCLINE HYCLATE 100 MG PO TABS: 100.0000 mg | ORAL_TABLET | Freq: Two times a day (BID) | ORAL | Status: DC

## 2014-01-19 NOTE — Telephone Encounter (Signed)
Spoke with patient and her phone was running out of minutes but she said the boil on he breast hasn't shrunk any with the antibiotics and she is wanting a refill. Has appt Monday here. Jenkins didn't cut the boil open when she went because he was trying to shink it with the antibiotics first.

## 2014-01-19 NOTE — Telephone Encounter (Signed)
Patient aware to check with the pharmacy later

## 2014-01-19 NOTE — Telephone Encounter (Signed)
pls refill medication x 1 and let her know

## 2014-01-23 ENCOUNTER — Ambulatory Visit (INDEPENDENT_AMBULATORY_CARE_PROVIDER_SITE_OTHER): Payer: PRIVATE HEALTH INSURANCE | Admitting: Family Medicine

## 2014-01-23 ENCOUNTER — Other Ambulatory Visit: Payer: Self-pay | Admitting: Family Medicine

## 2014-01-23 ENCOUNTER — Encounter: Payer: Self-pay | Admitting: Family Medicine

## 2014-01-23 VITALS — BP 148/100 | HR 96 | Resp 18 | Ht 62.0 in | Wt 173.0 lb

## 2014-01-23 DIAGNOSIS — E785 Hyperlipidemia, unspecified: Secondary | ICD-10-CM

## 2014-01-23 DIAGNOSIS — I10 Essential (primary) hypertension: Secondary | ICD-10-CM

## 2014-01-23 DIAGNOSIS — N61 Mastitis without abscess: Secondary | ICD-10-CM

## 2014-01-23 DIAGNOSIS — IMO0002 Reserved for concepts with insufficient information to code with codable children: Secondary | ICD-10-CM

## 2014-01-23 DIAGNOSIS — E1065 Type 1 diabetes mellitus with hyperglycemia: Secondary | ICD-10-CM

## 2014-01-23 DIAGNOSIS — N183 Chronic kidney disease, stage 3 unspecified: Secondary | ICD-10-CM

## 2014-01-23 DIAGNOSIS — F17200 Nicotine dependence, unspecified, uncomplicated: Secondary | ICD-10-CM

## 2014-01-23 DIAGNOSIS — Z9119 Patient's noncompliance with other medical treatment and regimen: Secondary | ICD-10-CM

## 2014-01-23 DIAGNOSIS — N63 Unspecified lump in unspecified breast: Secondary | ICD-10-CM

## 2014-01-23 DIAGNOSIS — Z91199 Patient's noncompliance with other medical treatment and regimen due to unspecified reason: Secondary | ICD-10-CM

## 2014-01-23 MED ORDER — ROSUVASTATIN CALCIUM 40 MG PO TABS: 40.0000 mg | ORAL_TABLET | Freq: Every day | ORAL | Status: DC

## 2014-01-23 MED ORDER — ALBUTEROL SULFATE HFA 108 (90 BASE) MCG/ACT IN AERS: 2.0000 | INHALATION_SPRAY | Freq: Four times a day (QID) | RESPIRATORY_TRACT | Status: DC | PRN

## 2014-01-23 MED ORDER — PANTOPRAZOLE SODIUM 40 MG PO TBEC: 40.0000 mg | DELAYED_RELEASE_TABLET | Freq: Every day | ORAL | Status: DC

## 2014-01-23 MED ORDER — CLOPIDOGREL BISULFATE 75 MG PO TABS: 75.0000 mg | ORAL_TABLET | Freq: Every day | ORAL | Status: DC

## 2014-01-23 MED ORDER — TRIAMTERENE-HCTZ 37.5-25 MG PO TABS: 1.0000 | ORAL_TABLET | Freq: Every day | ORAL | Status: DC

## 2014-01-23 MED ORDER — IBUPROFEN 800 MG PO TABS: ORAL_TABLET | ORAL | Status: DC

## 2014-01-23 MED ORDER — FUROSEMIDE 40 MG PO TABS: ORAL_TABLET | ORAL | Status: DC

## 2014-01-23 NOTE — Patient Instructions (Signed)
F/u in early May call if you need me before  New for bp is maxzide 25 mg one daily, stop cl;onidien since you state it irritates skin  Chem 7 and eGFR today  HBA1C, chem 7 and EGFR in May 3 days before follow up

## 2014-01-24 LAB — BASIC METABOLIC PANEL WITH GFR
BUN: 17 mg/dL (ref 6–23)
CO2: 25 mEq/L (ref 19–32)
Calcium: 9.1 mg/dL (ref 8.4–10.5)
Chloride: 106 mEq/L (ref 96–112)
Creat: 1.25 mg/dL — ABNORMAL HIGH (ref 0.50–1.10)
GFR, Est African American: 58 mL/min — ABNORMAL LOW
GFR, Est Non African American: 51 mL/min — ABNORMAL LOW
Glucose, Bld: 227 mg/dL — ABNORMAL HIGH (ref 70–99)
Potassium: 3.6 mEq/L (ref 3.5–5.3)
Sodium: 140 mEq/L (ref 135–145)

## 2014-01-27 DIAGNOSIS — N183 Chronic kidney disease, stage 3 unspecified: Secondary | ICD-10-CM | POA: Insufficient documentation

## 2014-01-27 DIAGNOSIS — N6320 Unspecified lump in the left breast, unspecified quadrant: Secondary | ICD-10-CM | POA: Insufficient documentation

## 2014-01-27 DIAGNOSIS — N179 Acute kidney failure, unspecified: Secondary | ICD-10-CM | POA: Insufficient documentation

## 2014-01-27 DIAGNOSIS — N611 Abscess of the breast and nipple: Secondary | ICD-10-CM | POA: Insufficient documentation
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
## 2014-01-27 NOTE — Assessment & Plan Note (Signed)
Importance of improved control of HTn and diabetes is stressed to prevent further deterioration of renal function also smoking cessation

## 2014-01-27 NOTE — Assessment & Plan Note (Signed)
An ongoing problem , pt agaain counseled to change this behavior so health will improve

## 2014-01-27 NOTE — Assessment & Plan Note (Signed)
Unchanged, and still has not kept appt with endo despite multiple referrals and the clear knowledge that her blood suagr ios uncontrolled and she has complications as a result of this
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
## 2014-01-27 NOTE — Assessment & Plan Note (Signed)
Improved increase in medication dose DASH diet and commitment to daily physical activity for a minimum of 30 minutes discussed and encouraged, as a part of hypertension management. The importance of attaining a healthy weight is also discussed.

## 2014-01-27 NOTE — Assessment & Plan Note (Signed)
Recurrent .persistent left breast abscess, initially drained by radiology approx 4 months ago, however recurrent , and enlarged. Under care of surgeon currently, pt reports no significant reduction in size of lesion. Denies fever, chills or drainage from the area

## 2014-01-27 NOTE — Assessment & Plan Note (Signed)
Unchanged. Patient counseled for approximately 5 minutes regarding the health risks of ongoing nicotine use, specifically all types of cancer, heart disease, stroke and respiratory failure. The options available for help with cessation ,the behavioral changes to assist the process, and the option to either gradully reduce usage  Or abruptly stop.is also discussed. Pt is also encouraged to set specific goals in number of cigarettes used daily, as well as to set a quit date.  

## 2014-01-27 NOTE — Assessment & Plan Note (Signed)
Improved , low fat diet advised , continue crestor

## 2014-02-13 ENCOUNTER — Other Ambulatory Visit: Payer: Self-pay | Admitting: Family Medicine

## 2014-03-05 ENCOUNTER — Ambulatory Visit: Payer: PRIVATE HEALTH INSURANCE | Admitting: Family Medicine

## 2014-03-15 ENCOUNTER — Other Ambulatory Visit: Payer: Self-pay | Admitting: Family Medicine

## 2014-03-17 ENCOUNTER — Encounter (HOSPITAL_COMMUNITY): Payer: Self-pay | Admitting: Emergency Medicine

## 2014-03-17 ENCOUNTER — Emergency Department (HOSPITAL_COMMUNITY): Payer: PRIVATE HEALTH INSURANCE

## 2014-03-17 ENCOUNTER — Emergency Department (HOSPITAL_COMMUNITY)
Admission: EM | Admit: 2014-03-17 | Discharge: 2014-03-18 | Disposition: A | Discharge status: Home / Self Care | Payer: PRIVATE HEALTH INSURANCE | Attending: Emergency Medicine | Admitting: Emergency Medicine

## 2014-03-17 DIAGNOSIS — F411 Generalized anxiety disorder: Secondary | ICD-10-CM | POA: Insufficient documentation

## 2014-03-17 DIAGNOSIS — Z7982 Long term (current) use of aspirin: Secondary | ICD-10-CM | POA: Insufficient documentation

## 2014-03-17 DIAGNOSIS — I10 Essential (primary) hypertension: Secondary | ICD-10-CM | POA: Insufficient documentation

## 2014-03-17 DIAGNOSIS — Z872 Personal history of diseases of the skin and subcutaneous tissue: Secondary | ICD-10-CM | POA: Insufficient documentation

## 2014-03-17 DIAGNOSIS — E785 Hyperlipidemia, unspecified: Secondary | ICD-10-CM | POA: Insufficient documentation

## 2014-03-17 DIAGNOSIS — Z79899 Other long term (current) drug therapy: Secondary | ICD-10-CM | POA: Insufficient documentation

## 2014-03-17 DIAGNOSIS — Z8781 Personal history of (healed) traumatic fracture: Secondary | ICD-10-CM | POA: Insufficient documentation

## 2014-03-17 DIAGNOSIS — Z8719 Personal history of other diseases of the digestive system: Secondary | ICD-10-CM | POA: Insufficient documentation

## 2014-03-17 DIAGNOSIS — E119 Type 2 diabetes mellitus without complications: Secondary | ICD-10-CM | POA: Insufficient documentation

## 2014-03-17 DIAGNOSIS — I5030 Unspecified diastolic (congestive) heart failure: Secondary | ICD-10-CM | POA: Insufficient documentation

## 2014-03-17 DIAGNOSIS — Z794 Long term (current) use of insulin: Secondary | ICD-10-CM | POA: Insufficient documentation

## 2014-03-17 DIAGNOSIS — E669 Obesity, unspecified: Secondary | ICD-10-CM | POA: Insufficient documentation

## 2014-03-17 DIAGNOSIS — E7800 Pure hypercholesterolemia, unspecified: Secondary | ICD-10-CM | POA: Insufficient documentation

## 2014-03-17 DIAGNOSIS — S0990XA Unspecified injury of head, initial encounter: Secondary | ICD-10-CM | POA: Insufficient documentation

## 2014-03-17 DIAGNOSIS — Z7902 Long term (current) use of antithrombotics/antiplatelets: Secondary | ICD-10-CM | POA: Insufficient documentation

## 2014-03-17 DIAGNOSIS — F17200 Nicotine dependence, unspecified, uncomplicated: Secondary | ICD-10-CM | POA: Insufficient documentation

## 2014-03-17 MED ORDER — ACETAMINOPHEN 325 MG PO TABS
650.0000 mg | ORAL_TABLET | Freq: Once | ORAL | Status: AC
  Administered 2014-03-17: 650 mg via ORAL
  Filled 2014-03-17: qty 2

## 2014-03-17 NOTE — ED Notes (Signed)
Patient transported to CT 
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
## 2014-03-28 ENCOUNTER — Ambulatory Visit: Payer: PRIVATE HEALTH INSURANCE | Admitting: Family Medicine

## 2014-04-19 ENCOUNTER — Other Ambulatory Visit: Payer: Self-pay | Admitting: Family Medicine

## 2014-04-25 ENCOUNTER — Telehealth: Payer: Self-pay | Admitting: Family Medicine

## 2014-04-25 NOTE — Telephone Encounter (Signed)
noted 

## 2014-04-28 ENCOUNTER — Encounter (HOSPITAL_COMMUNITY): Payer: Self-pay | Admitting: Emergency Medicine

## 2014-04-28 ENCOUNTER — Emergency Department (HOSPITAL_COMMUNITY)
Admission: EM | Admit: 2014-04-28 | Discharge: 2014-04-30 | Disposition: A | Discharge status: Other Acute Inpatient Hospital | Payer: PRIVATE HEALTH INSURANCE | Attending: Emergency Medicine | Admitting: Emergency Medicine

## 2014-04-28 DIAGNOSIS — F329 Major depressive disorder, single episode, unspecified: Secondary | ICD-10-CM | POA: Insufficient documentation

## 2014-04-28 DIAGNOSIS — F17200 Nicotine dependence, unspecified, uncomplicated: Secondary | ICD-10-CM | POA: Insufficient documentation

## 2014-04-28 DIAGNOSIS — I10 Essential (primary) hypertension: Secondary | ICD-10-CM

## 2014-04-28 DIAGNOSIS — Z8719 Personal history of other diseases of the digestive system: Secondary | ICD-10-CM | POA: Insufficient documentation

## 2014-04-28 DIAGNOSIS — Z794 Long term (current) use of insulin: Secondary | ICD-10-CM | POA: Insufficient documentation

## 2014-04-28 DIAGNOSIS — F3289 Other specified depressive episodes: Secondary | ICD-10-CM | POA: Insufficient documentation

## 2014-04-28 DIAGNOSIS — I1310 Hypertensive heart and chronic kidney disease without heart failure, with stage 1 through stage 4 chronic kidney disease, or unspecified chronic kidney disease: Secondary | ICD-10-CM | POA: Insufficient documentation

## 2014-04-28 DIAGNOSIS — Z91199 Patient's noncompliance with other medical treatment and regimen due to unspecified reason: Secondary | ICD-10-CM

## 2014-04-28 DIAGNOSIS — F32A Depression, unspecified: Secondary | ICD-10-CM

## 2014-04-28 DIAGNOSIS — E7800 Pure hypercholesterolemia, unspecified: Secondary | ICD-10-CM | POA: Insufficient documentation

## 2014-04-28 DIAGNOSIS — F1910 Other psychoactive substance abuse, uncomplicated: Secondary | ICD-10-CM

## 2014-04-28 DIAGNOSIS — Z9119 Patient's noncompliance with other medical treatment and regimen: Secondary | ICD-10-CM

## 2014-04-28 DIAGNOSIS — E669 Obesity, unspecified: Secondary | ICD-10-CM | POA: Insufficient documentation

## 2014-04-28 DIAGNOSIS — E1149 Type 2 diabetes mellitus with other diabetic neurological complication: Secondary | ICD-10-CM | POA: Insufficient documentation

## 2014-04-28 DIAGNOSIS — N189 Chronic kidney disease, unspecified: Secondary | ICD-10-CM | POA: Insufficient documentation

## 2014-04-28 DIAGNOSIS — Z7902 Long term (current) use of antithrombotics/antiplatelets: Secondary | ICD-10-CM | POA: Insufficient documentation

## 2014-04-28 DIAGNOSIS — Z8742 Personal history of other diseases of the female genital tract: Secondary | ICD-10-CM | POA: Insufficient documentation

## 2014-04-28 DIAGNOSIS — R45851 Suicidal ideations: Secondary | ICD-10-CM

## 2014-04-28 DIAGNOSIS — Z79899 Other long term (current) drug therapy: Secondary | ICD-10-CM | POA: Insufficient documentation

## 2014-04-28 DIAGNOSIS — F410 Panic disorder [episodic paroxysmal anxiety] without agoraphobia: Secondary | ICD-10-CM | POA: Insufficient documentation

## 2014-04-28 DIAGNOSIS — Z8781 Personal history of (healed) traumatic fracture: Secondary | ICD-10-CM | POA: Insufficient documentation

## 2014-04-28 DIAGNOSIS — E785 Hyperlipidemia, unspecified: Secondary | ICD-10-CM | POA: Insufficient documentation

## 2014-04-28 HISTORY — DX: Patient's other noncompliance with medication regimen: Z91.14

## 2014-04-28 LAB — BASIC METABOLIC PANEL
Anion gap: 13 (ref 5–15)
BUN: 13 mg/dL (ref 6–23)
CO2: 22 mEq/L (ref 19–32)
Calcium: 9.4 mg/dL (ref 8.4–10.5)
Chloride: 106 mEq/L (ref 96–112)
Creatinine, Ser: 1.01 mg/dL (ref 0.50–1.10)
GFR calc Af Amer: 74 mL/min — ABNORMAL LOW (ref >90)
GFR calc non Af Amer: 64 mL/min — ABNORMAL LOW (ref >90)
Glucose, Bld: 174 mg/dL — ABNORMAL HIGH (ref 70–99)
Potassium: 3.4 mEq/L — ABNORMAL LOW (ref 3.7–5.3)
Sodium: 141 mEq/L (ref 137–147)

## 2014-04-28 LAB — CBC WITH DIFFERENTIAL/PLATELET
Basophils Absolute: 0.1 10*3/uL (ref 0.0–0.1)
Basophils Relative: 1 % (ref 0–1)
Eosinophils Absolute: 0.2 10*3/uL (ref 0.0–0.7)
Eosinophils Relative: 2 % (ref 0–5)
HCT: 45.5 % (ref 36.0–46.0)
Hemoglobin: 15.5 g/dL — ABNORMAL HIGH (ref 12.0–15.0)
Lymphocytes Relative: 27 % (ref 12–46)
Lymphs Abs: 1.8 10*3/uL (ref 0.7–4.0)
MCH: 31.4 pg (ref 26.0–34.0)
MCHC: 34.1 g/dL (ref 30.0–36.0)
MCV: 92.1 fL (ref 78.0–100.0)
Monocytes Absolute: 0.4 10*3/uL (ref 0.1–1.0)
Monocytes Relative: 6 % (ref 3–12)
Neutro Abs: 4.3 10*3/uL (ref 1.7–7.7)
Neutrophils Relative %: 64 % (ref 43–77)
Platelets: 229 10*3/uL (ref 150–400)
RBC: 4.94 MIL/uL (ref 3.87–5.11)
RDW: 14.1 % (ref 11.5–15.5)
WBC: 6.7 10*3/uL (ref 4.0–10.5)

## 2014-04-28 LAB — CBG MONITORING, ED
Glucose-Capillary: 180 mg/dL — ABNORMAL HIGH (ref 70–99)
Glucose-Capillary: 182 mg/dL — ABNORMAL HIGH (ref 70–99)

## 2014-04-28 LAB — RAPID URINE DRUG SCREEN, HOSP PERFORMED
Amphetamines: NOT DETECTED
Barbiturates: NOT DETECTED
Benzodiazepines: NOT DETECTED
Cocaine: POSITIVE — AB
Opiates: NOT DETECTED
Tetrahydrocannabinol: POSITIVE — AB

## 2014-04-28 LAB — ETHANOL: Alcohol, Ethyl (B): <11 mg/dL (ref 0–11)

## 2014-04-28 MED ORDER — LORAZEPAM 1 MG PO TABS
1.0000 mg | ORAL_TABLET | Freq: Three times a day (TID) | ORAL | Status: DC | PRN
  Administered 2014-04-30: 1 mg via ORAL
  Filled 2014-04-28: qty 1

## 2014-04-28 MED ORDER — TRIAMTERENE-HCTZ 37.5-25 MG PO TABS
1.0000 | ORAL_TABLET | Freq: Every day | ORAL | Status: DC
  Filled 2014-04-28 (×2): qty 1

## 2014-04-28 MED ORDER — VITAMIN B-1 100 MG PO TABS
100.0000 mg | ORAL_TABLET | Freq: Every day | ORAL | Status: DC
  Administered 2014-04-28 – 2014-04-30 (×3): 100 mg via ORAL
  Filled 2014-04-28 (×2): qty 1

## 2014-04-28 MED ORDER — FLUOXETINE HCL 10 MG PO CAPS
10.0000 mg | ORAL_CAPSULE | Freq: Every day | ORAL | Status: DC
  Administered 2014-04-28 – 2014-04-30 (×3): 10 mg via ORAL
  Filled 2014-04-28 (×4): qty 1

## 2014-04-28 MED ORDER — ONDANSETRON HCL 4 MG PO TABS: 4.0000 mg | ORAL_TABLET | Freq: Three times a day (TID) | ORAL | Status: DC | PRN

## 2014-04-28 MED ORDER — AMLODIPINE BESYLATE 5 MG PO TABS
10.0000 mg | ORAL_TABLET | Freq: Every day | ORAL | Status: DC
  Administered 2014-04-28 – 2014-04-30 (×3): 10 mg via ORAL
  Filled 2014-04-28 (×2): qty 2

## 2014-04-28 MED ORDER — VITAMIN B-1 100 MG PO TABS
ORAL_TABLET | ORAL | Status: AC
Start: 2014-04-28 — End: 2014-04-29
  Filled 2014-04-28: qty 1

## 2014-04-28 MED ORDER — AMLODIPINE BESYLATE 5 MG PO TABS
10.0000 mg | ORAL_TABLET | Freq: Once | ORAL | Status: AC
  Administered 2014-04-28: 10 mg via ORAL
  Filled 2014-04-28: qty 2

## 2014-04-28 MED ORDER — PANTOPRAZOLE SODIUM 40 MG PO TBEC
40.0000 mg | DELAYED_RELEASE_TABLET | Freq: Every day | ORAL | Status: DC
  Administered 2014-04-28 – 2014-04-30 (×3): 40 mg via ORAL
  Filled 2014-04-28 (×2): qty 1

## 2014-04-28 MED ORDER — BENAZEPRIL HCL 10 MG PO TABS
40.0000 mg | ORAL_TABLET | Freq: Every day | ORAL | Status: DC
  Administered 2014-04-29 – 2014-04-30 (×2): 40 mg via ORAL
  Filled 2014-04-28: qty 2
  Filled 2014-04-28: qty 4
  Filled 2014-04-28 (×2): qty 2

## 2014-04-28 MED ORDER — FLUOXETINE HCL 10 MG PO CAPS
ORAL_CAPSULE | ORAL | Status: AC
  Filled 2014-04-28: qty 1

## 2014-04-28 MED ORDER — ALUM & MAG HYDROXIDE-SIMETH 200-200-20 MG/5ML PO SUSP: 30.0000 mL | ORAL | Status: DC | PRN

## 2014-04-28 MED ORDER — AMLODIPINE BESYLATE 5 MG PO TABS
ORAL_TABLET | ORAL | Status: AC
  Filled 2014-04-28: qty 1

## 2014-04-28 MED ORDER — LORAZEPAM 1 MG PO TABS: 0.0000 mg | ORAL_TABLET | Freq: Two times a day (BID) | ORAL | Status: DC

## 2014-04-28 MED ORDER — CLOPIDOGREL BISULFATE 75 MG PO TABS
75.0000 mg | ORAL_TABLET | Freq: Every day | ORAL | Status: DC
  Administered 2014-04-29 – 2014-04-30 (×2): 75 mg via ORAL
  Filled 2014-04-28 (×2): qty 1

## 2014-04-28 MED ORDER — THIAMINE HCL 100 MG/ML IJ SOLN: 100.0000 mg | Freq: Every day | INTRAMUSCULAR | Status: DC

## 2014-04-28 MED ORDER — ZOLPIDEM TARTRATE 5 MG PO TABS
5.0000 mg | ORAL_TABLET | Freq: Every evening | ORAL | Status: DC | PRN
  Administered 2014-04-29: 5 mg via ORAL
  Filled 2014-04-28: qty 1

## 2014-04-28 MED ORDER — ACETAMINOPHEN 325 MG PO TABS
650.0000 mg | ORAL_TABLET | ORAL | Status: DC | PRN
  Administered 2014-04-29 – 2014-04-30 (×4): 650 mg via ORAL
  Filled 2014-04-28 (×4): qty 2

## 2014-04-28 MED ORDER — FLUOXETINE HCL 20 MG PO CAPS
ORAL_CAPSULE | ORAL | Status: AC
  Filled 2014-04-28: qty 1

## 2014-04-28 MED ORDER — NICOTINE 21 MG/24HR TD PT24
MEDICATED_PATCH | TRANSDERMAL | Status: AC
  Administered 2014-04-28: 21 mg via TRANSDERMAL
  Filled 2014-04-28: qty 1

## 2014-04-28 MED ORDER — FUROSEMIDE 40 MG PO TABS
40.0000 mg | ORAL_TABLET | Freq: Every day | ORAL | Status: DC
  Administered 2014-04-28 – 2014-04-30 (×3): 40 mg via ORAL
  Filled 2014-04-28 (×2): qty 1

## 2014-04-28 MED ORDER — METOPROLOL TARTRATE 25 MG PO TABS
25.0000 mg | ORAL_TABLET | Freq: Once | ORAL | Status: AC
  Administered 2014-04-28: 25 mg via ORAL
  Filled 2014-04-28: qty 1

## 2014-04-28 MED ORDER — FUROSEMIDE 40 MG PO TABS
40.0000 mg | ORAL_TABLET | Freq: Once | ORAL | Status: AC
  Administered 2014-04-28: 40 mg via ORAL
  Filled 2014-04-28: qty 1

## 2014-04-28 MED ORDER — NICOTINE 21 MG/24HR TD PT24
21.0000 mg | MEDICATED_PATCH | Freq: Once | TRANSDERMAL | Status: AC
  Administered 2014-04-28: 21 mg via TRANSDERMAL

## 2014-04-28 MED ORDER — INSULIN ASPART 100 UNIT/ML ~~LOC~~ SOLN
0.0000 [IU] | Freq: Three times a day (TID) | SUBCUTANEOUS | Status: DC
  Administered 2014-04-29: 3 [IU] via SUBCUTANEOUS
  Administered 2014-04-29 (×2): 5 [IU] via SUBCUTANEOUS
  Administered 2014-04-30 (×2): 3 [IU] via SUBCUTANEOUS
  Filled 2014-04-28 (×4): qty 1

## 2014-04-28 MED ORDER — LORAZEPAM 1 MG PO TABS
1.0000 mg | ORAL_TABLET | Freq: Once | ORAL | Status: AC
  Administered 2014-04-28: 1 mg via ORAL
  Filled 2014-04-28: qty 1

## 2014-04-28 MED ORDER — TRIAMTERENE-HCTZ 37.5-25 MG PO TABS
ORAL_TABLET | ORAL | Status: AC
  Filled 2014-04-28: qty 1

## 2014-04-28 MED ORDER — METOPROLOL TARTRATE 25 MG PO TABS
12.5000 mg | ORAL_TABLET | Freq: Two times a day (BID) | ORAL | Status: DC
  Administered 2014-04-28 – 2014-04-30 (×4): 12.5 mg via ORAL
  Filled 2014-04-28 (×4): qty 1

## 2014-04-28 MED ORDER — FUROSEMIDE 40 MG PO TABS
ORAL_TABLET | ORAL | Status: AC
  Filled 2014-04-28: qty 1

## 2014-04-28 MED ORDER — PANTOPRAZOLE SODIUM 40 MG PO TBEC
DELAYED_RELEASE_TABLET | ORAL | Status: AC
  Filled 2014-04-28: qty 1

## 2014-04-28 MED ORDER — LORAZEPAM 1 MG PO TABS
0.0000 mg | ORAL_TABLET | Freq: Four times a day (QID) | ORAL | Status: DC
  Administered 2014-04-29: 1 mg via ORAL
  Filled 2014-04-28: qty 1

## 2014-04-28 MED ORDER — ATORVASTATIN CALCIUM 40 MG PO TABS
80.0000 mg | ORAL_TABLET | Freq: Every day | ORAL | Status: DC
  Administered 2014-04-28 – 2014-04-29 (×2): 80 mg via ORAL
  Filled 2014-04-28 (×3): qty 2

## 2014-04-28 MED ORDER — BENAZEPRIL HCL 10 MG PO TABS
40.0000 mg | ORAL_TABLET | Freq: Once | ORAL | Status: AC
  Administered 2014-04-28: 40 mg via ORAL
  Filled 2014-04-28: qty 1

## 2014-04-28 NOTE — ED Notes (Signed)
Pt is also hypertensive 238/152

## 2014-04-28 NOTE — ED Notes (Signed)
Pt laying watching tv. Per sitter, pt has had 150/160hr for a couple seconds and recoops to nsr quickly. EDP aware.

## 2014-04-28 NOTE — ED Provider Notes (Signed)
CSN: [id number]     Arrival date & time 04/28/14  1304 History   First MD Initiated Contact with Patient 04/28/14 1338     Chief Complaint  Patient presents with  . V70.1      HPI Pt was seen at 1345. Per pt, c/o gradual onset and persistence of constant polysubstance abuse for the past 4 years. Pt states she has been abusing alcohol and crack cocaine. LD of both was last night. Pt states she came to the ED for "help" because she "just wants to stop using" and "I tired of this and don't want to be a drug addict anymore." States her last detox was "many years ago." Denies withdrawal symptoms currently. Denies SI, no SA, no HI, no hallucinations.    Past Medical History  Diagnosis Date  . Alcohol use     Now abstinent.  . Diabetes mellitus, type 2   . Obesity   . Hyperlipidemia   . Hypertension   . Anxiety   . Panic attacks   . Hypercholesteremia   . Diabetic Charcot foot 12/30/2011  . RECTAL BLEEDING, HX OF 12/05/2009    Qualifier: Diagnosis of  By: Zeb Comfort    . Ankle fracture, lateral malleolus, closed 12/30/2011  . Breast mass, left 12/15/2011  . Tobacco abuse   . ANXIETY 04/17/2010    Qualifier: Diagnosis of  By: Claybon Jabs PA, Dawn    . Hypertensive cardiomyopathy 11/08/2012    Ejection fraction 40-45%.  . Diastolic dysfunction 1/44/8185    Grade 1.  . CKD (chronic kidney disease)   . Noncompliance with medication regimen    Past Surgical History  Procedure Laterality Date  . Dilation and curettage of uterus    . Breast biopsy     Family History  Problem Relation Age of Onset  . Hypertension Mother   . Hypertension Father     CABG   . Hypertension Sister   . Heart attack Mother   . Arthritis    . Cancer    . Diabetes    . Asthma     History  Substance Use Topics  . Smoking status: Current Every Day Smoker -- 0.50 packs/day for 30 years    Types: Cigarettes  . Smokeless tobacco: Never Used  . Alcohol Use: Yes     Comment: wine and mickeys     Review of  Systems ROS: Statement: All systems negative except as marked or noted in the HPI; Constitutional: Negative for fever and chills. ; ; Eyes: Negative for eye pain, redness and discharge. ; ; ENMT: Negative for ear pain, hoarseness, nasal congestion, sinus pressure and sore throat. ; ; Cardiovascular: Negative for chest pain, palpitations, diaphoresis, dyspnea and peripheral edema. ; ; Respiratory: Negative for cough, wheezing and stridor. ; ; Gastrointestinal: Negative for nausea, vomiting, diarrhea, abdominal pain, blood in stool, hematemesis, jaundice and rectal bleeding. . ; ; Genitourinary: Negative for dysuria, flank pain and hematuria. ; ; Musculoskeletal: Negative for back pain and neck pain. Negative for swelling and trauma.; ; Skin: Negative for pruritus, rash, abrasions, blisters, bruising and skin lesion.; ; Neuro: Negative for headache, lightheadedness and neck stiffness. Negative for weakness, altered level of consciousness , altered mental status, extremity weakness, paresthesias, involuntary movement, seizure and syncope.; Psych:  No SI, no SA, no HI, no hallucinations.         Allergies  Review of patient's allergies indicates no known allergies.  Home Medications   Prior to Admission medications   Medication  Sig Start Date End Date Taking? Authorizing Provider  amLODipine (NORVASC) 10 MG tablet Take 1 tablet (10 mg total) by mouth daily. 05/26/13 05/26/14 Yes [name], MD  benazepril (LOTENSIN) 40 MG tablet Take 1 tablet (40 mg total) by mouth daily. 12/29/13  Yes [name], MD  clopidogrel (PLAVIX) 75 MG tablet Take 1 tablet (75 mg total) by mouth daily with breakfast. 01/23/14  Yes [name], MD  FLUoxetine (PROZAC) 10 MG capsule Take 1 capsule (10 mg total) by mouth daily. 11/22/13  Yes Lezlie Octave Black, NP  furosemide (LASIX) 40 MG tablet Take 40 mg by mouth daily. 01/23/14  Yes [name], MD  insulin glargine (LANTUS) 100 UNIT/ML injection Inject 30  Units into the skin at bedtime. Recently prescribed by Dr. Dorris Fetch, but patient has not been taking   Yes Historical Provider, MD  metoprolol tartrate (LOPRESSOR) 25 MG tablet Take 12.5 mg by mouth 2 (two) times daily.   Yes Historical Provider, MD  pantoprazole (PROTONIX) 40 MG tablet Take 1 tablet (40 mg total) by mouth daily. 01/23/14  Yes [name], MD  potassium chloride SA (K-DUR,KLOR-CON) 20 MEQ tablet Take 30 mEq by mouth daily.   Yes Historical Provider, MD  rosuvastatin (CRESTOR) 40 MG tablet Take 1 tablet (40 mg total) by mouth daily. 01/23/14  Yes [name], MD  traMADol (ULTRAM) 50 MG tablet Take 50 mg by mouth 2 (two) times daily.   Yes Historical Provider, MD   BP 191/120  Pulse 91  Temp(Src) 98 F (36.7 C) (Oral)  Resp 24  Ht 5\' 1"  (1.549 m)  Wt 178 lb (80.74 kg)  BMI 33.65 kg/m2  SpO2 96%  LMP 04/24/2014 Filed Vitals:   04/28/14 1830 04/28/14 1915 04/28/14 1930 04/28/14 1945  BP: 154/92  171/100   Pulse:      Temp:      TempSrc:      Resp: 26 22 26 21   Height:      Weight:      SpO2:        Physical Exam 1350: Physical examination:  Nursing notes reviewed; Vital signs and O2 SAT reviewed;  Constitutional: Well developed, Well nourished, Well hydrated, In no acute distress; Head:  Normocephalic, atraumatic; Eyes: EOMI, PERRL, No scleral icterus; ENMT: Mouth and pharynx normal, Mucous membranes moist; Neck: Supple, Full range of motion, No lymphadenopathy; Cardiovascular: Regular rate and rhythm, No gallop; Respiratory: Breath sounds clear & equal bilaterally, No rales, rhonchi, wheezes.  Speaking full sentences with ease, Normal respiratory effort/excursion; Chest: Nontender, Movement normal; Abdomen: Soft, Nontender, Nondistended, Normal bowel sounds; Genitourinary: No CVA tenderness; Extremities: Pulses normal, No tenderness, No edema, No calf edema or asymmetry.; Neuro: AA&Ox3, Major CN grossly intact.  Speech clear. No gross focal motor or sensory  deficits in extremities. Climbs on and off stretcher easily by herself. Gait steady.; Skin: Color normal, Warm, Dry.; Psych:  Calm, cooperative. Denies SI/SA, no psychosis.    ED Course  Procedures     EKG Interpretation None      MDM  MDM Reviewed: previous chart, nursing note and vitals Reviewed previous: labs Interpretation: labs    Results for orders placed during the hospital encounter of 04/28/14  URINE RAPID DRUG SCREEN (HOSP PERFORMED)      Result Value Ref Range   Opiates NONE DETECTED  NONE DETECTED   Cocaine POSITIVE (*) NONE DETECTED   Benzodiazepines NONE DETECTED  NONE DETECTED   Amphetamines NONE DETECTED  NONE DETECTED   Tetrahydrocannabinol POSITIVE (*) NONE DETECTED   Barbiturates NONE DETECTED  NONE DETECTED  ETHANOL      Result Value Ref Range   Alcohol, Ethyl (B) <11  0 - 11 mg/dL  BASIC METABOLIC PANEL      Result Value Ref Range   Sodium 141  137 - 147 mEq/L   Potassium 3.4 (*) 3.7 - 5.3 mEq/L   Chloride 106  96 - 112 mEq/L   CO2 22  19 - 32 mEq/L   Glucose, Bld 174 (*) 70 - 99 mg/dL   BUN 13  6 - 23 mg/dL   Creatinine, Ser 1.01  0.50 - 1.10 mg/dL   Calcium 9.4  8.4 - 10.5 mg/dL   GFR calc non Af Amer 64 (*) >90 mL/min   GFR calc Af Amer 74 (*) >90 mL/min   Anion gap 13  5 - 15  CBC WITH DIFFERENTIAL      Result Value Ref Range   WBC 6.7  4.0 - 10.5 K/uL   RBC 4.94  3.87 - 5.11 MIL/uL   Hemoglobin 15.5 (*) 12.0 - 15.0 g/dL   HCT 45.5  36.0 - 46.0 %   MCV 92.1  78.0 - 100.0 fL   MCH 31.4  26.0 - 34.0 pg   MCHC 34.1  30.0 - 36.0 g/dL   RDW 14.1  11.5 - 15.5 %   Platelets 229  150 - 400 K/uL   Neutrophils Relative % 64  43 - 77 %   Neutro Abs 4.3  1.7 - 7.7 K/uL   Lymphocytes Relative 27  12 - 46 %   Lymphs Abs 1.8  0.7 - 4.0 K/uL   Monocytes Relative 6  3 - 12 %   Monocytes Absolute 0.4  0.1 - 1.0 K/uL   Eosinophils Relative 2  0 - 5 %   Eosinophils Absolute 0.2  0.0 - 0.7 K/uL   Basophils Relative 1  0 - 1 %   Basophils Absolute  0.1  0.0 - 0.1 K/uL  CBG MONITORING, ED      Result Value Ref Range   Glucose-Capillary 180 (*) 70 - 99 mg/dL    1500:  TSS to evaluate.   1715:  TSS has evaluated pt:  TTS staffed pt with Dr. Sabra Heck who recommend psychiatric admission, as pt told them she was having SI with plan to overdose on pills, [facility] concerned regarding pt's BP; long d/w TTS regarding pt's longstanding chronically elevated BP and medical non-compliance which is well documented in EPIC. They will further review and call me back.   1745:  TTS called back: they will not accept pt tonight at [facility], they will search for alternative placement and will re-evaluate tomorrow for possible acceptance to [facility], recommends IVC if pt attempts to leave the ED due to her endorsement of SI with plan to OD. Holding orders written.     Alfonzo Feller, DO 04/28/14 2039

## 2014-04-28 NOTE — Progress Notes (Signed)
The following facilities have been contacted regarding bed availability for inptx:  BED AVAILABLE/RFERRAL FAXED SHR- per [facility]- per Caralee Ates- per [facility]- per Lelon Frohlich [facility]- per [facility]- per [facility]- per [facility]- per [place]- per Amy c/a beds only Dorothyann Peng- per Richardean Chimera- only 1 female bed [place]- per [place]n [place] only 2 female gero beds available Autumn Patty- per Minette Brine kis beds only [place]- per Manuela Schwartz; wait list only [place]- per Idamae Lusher- per [facility] Disposition [facility]
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
## 2014-04-28 NOTE — ED Notes (Signed)
Pt states she wants off of alcohol and crack. Last used both last night. Pt states, "I'm just tired." Pt states she is suicidal and her plan is to take pills to overdose.

## 2014-04-29 DIAGNOSIS — F1994 Other psychoactive substance use, unspecified with psychoactive substance-induced mood disorder: Secondary | ICD-10-CM

## 2014-04-29 DIAGNOSIS — F1010 Alcohol abuse, uncomplicated: Secondary | ICD-10-CM

## 2014-04-29 DIAGNOSIS — F332 Major depressive disorder, recurrent severe without psychotic features: Secondary | ICD-10-CM

## 2014-04-29 LAB — CBG MONITORING, ED
Glucose-Capillary: 207 mg/dL — ABNORMAL HIGH (ref 70–99)
Glucose-Capillary: 160 mg/dL — ABNORMAL HIGH (ref 70–99)
Glucose-Capillary: 206 mg/dL — ABNORMAL HIGH (ref 70–99)

## 2014-04-29 MED ORDER — NICOTINE 21 MG/24HR TD PT24
21.0000 mg | MEDICATED_PATCH | Freq: Once | TRANSDERMAL | Status: AC
  Administered 2014-04-29 – 2014-04-30 (×2): 21 mg via TRANSDERMAL
  Filled 2014-04-29: qty 1

## 2014-04-29 NOTE — ED Notes (Signed)
Dinner tray given

## 2014-04-29 NOTE — ED Notes (Signed)
Pt took a shower earlier today, eating well and has remained pleasant. Pt is currently resting
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
## 2014-04-29 NOTE — ED Notes (Signed)
Pt requesting shower at this time.

## 2014-04-29 NOTE — BH Assessment (Signed)
Clayborne Dana, [facility] at [facility], confirms adult unit is at capacity. Contacted the following facilities for placement:  BED AVAILABLE, FAXED CLINICAL INFORMATION: [employer], per [facility], per [facility], per [facility] [employer] [facility], per [place], PT UNDER REVIEW: [facility], per [facility], Per [facility], per Izora Gala  AT CAPACITY: [facility], per [facility], per [facility], per [employer], per [employer], per [facility], per [employer] [facility], per Raechel Chute, per [facility], per [facility], per Abigail Butts  PT DECLINED: [facility]   [address] [place], [facility], [facility] Triage Specialist [phone]

## 2014-04-29 NOTE — ED Notes (Signed)
Pt ate all of her meal. NAD. Pt remains pleasant.

## 2014-04-29 NOTE — ED Provider Notes (Signed)
Pt wants alcohol detox; no SI,HI,hallucinations; calm and cooperative in ED today without vomiting or tremors; [facility] TTS anticipates placement possibly today. [place], MD 04/29/14 [phone]

## 2014-04-30 ENCOUNTER — Encounter (HOSPITAL_COMMUNITY): Payer: Self-pay | Admitting: *Deleted

## 2014-04-30 ENCOUNTER — Inpatient Hospital Stay (HOSPITAL_COMMUNITY)
Admission: AD | Admit: 2014-04-30 | Discharge: 2014-05-05 | DRG: 897 | Disposition: A | Discharge status: Home / Self Care | Payer: 59 | Source: Intra-hospital | Attending: Psychiatry | Admitting: Psychiatry

## 2014-04-30 DIAGNOSIS — Z91199 Patient's noncompliance with other medical treatment and regimen due to unspecified reason: Secondary | ICD-10-CM | POA: Diagnosis not present

## 2014-04-30 DIAGNOSIS — E669 Obesity, unspecified: Secondary | ICD-10-CM | POA: Diagnosis present

## 2014-04-30 DIAGNOSIS — E785 Hyperlipidemia, unspecified: Secondary | ICD-10-CM | POA: Diagnosis present

## 2014-04-30 DIAGNOSIS — G4700 Insomnia, unspecified: Secondary | ICD-10-CM | POA: Diagnosis present

## 2014-04-30 DIAGNOSIS — E1149 Type 2 diabetes mellitus with other diabetic neurological complication: Secondary | ICD-10-CM | POA: Diagnosis present

## 2014-04-30 DIAGNOSIS — E876 Hypokalemia: Secondary | ICD-10-CM | POA: Diagnosis present

## 2014-04-30 DIAGNOSIS — F10230 Alcohol dependence with withdrawal, uncomplicated: Secondary | ICD-10-CM

## 2014-04-30 DIAGNOSIS — F4310 Post-traumatic stress disorder, unspecified: Secondary | ICD-10-CM | POA: Diagnosis present

## 2014-04-30 DIAGNOSIS — T502X5A Adverse effect of carbonic-anhydrase inhibitors, benzothiadiazides and other diuretics, initial encounter: Secondary | ICD-10-CM | POA: Diagnosis present

## 2014-04-30 DIAGNOSIS — Z79899 Other long term (current) drug therapy: Secondary | ICD-10-CM | POA: Diagnosis not present

## 2014-04-30 DIAGNOSIS — E1165 Type 2 diabetes mellitus with hyperglycemia: Secondary | ICD-10-CM

## 2014-04-30 DIAGNOSIS — F1994 Other psychoactive substance use, unspecified with psychoactive substance-induced mood disorder: Secondary | ICD-10-CM | POA: Diagnosis present

## 2014-04-30 DIAGNOSIS — F1020 Alcohol dependence, uncomplicated: Secondary | ICD-10-CM | POA: Diagnosis present

## 2014-04-30 DIAGNOSIS — R45851 Suicidal ideations: Secondary | ICD-10-CM

## 2014-04-30 DIAGNOSIS — F1423 Cocaine dependence with withdrawal: Secondary | ICD-10-CM

## 2014-04-30 DIAGNOSIS — Z7902 Long term (current) use of antithrombotics/antiplatelets: Secondary | ICD-10-CM

## 2014-04-30 DIAGNOSIS — F1420 Cocaine dependence, uncomplicated: Secondary | ICD-10-CM | POA: Diagnosis present

## 2014-04-30 DIAGNOSIS — Z5989 Other problems related to housing and economic circumstances: Secondary | ICD-10-CM | POA: Diagnosis not present

## 2014-04-30 DIAGNOSIS — Z8249 Family history of ischemic heart disease and other diseases of the circulatory system: Secondary | ICD-10-CM

## 2014-04-30 DIAGNOSIS — F17200 Nicotine dependence, unspecified, uncomplicated: Secondary | ICD-10-CM | POA: Diagnosis present

## 2014-04-30 DIAGNOSIS — Z825 Family history of asthma and other chronic lower respiratory diseases: Secondary | ICD-10-CM | POA: Diagnosis not present

## 2014-04-30 DIAGNOSIS — I509 Heart failure, unspecified: Secondary | ICD-10-CM | POA: Diagnosis present

## 2014-04-30 DIAGNOSIS — F333 Major depressive disorder, recurrent, severe with psychotic symptoms: Secondary | ICD-10-CM | POA: Diagnosis present

## 2014-04-30 DIAGNOSIS — Z794 Long term (current) use of insulin: Secondary | ICD-10-CM

## 2014-04-30 DIAGNOSIS — F410 Panic disorder [episodic paroxysmal anxiety] without agoraphobia: Secondary | ICD-10-CM | POA: Diagnosis present

## 2014-04-30 DIAGNOSIS — Z833 Family history of diabetes mellitus: Secondary | ICD-10-CM | POA: Diagnosis not present

## 2014-04-30 DIAGNOSIS — F1010 Alcohol abuse, uncomplicated: Secondary | ICD-10-CM | POA: Diagnosis present

## 2014-04-30 DIAGNOSIS — I639 Cerebral infarction, unspecified: Secondary | ICD-10-CM | POA: Diagnosis not present

## 2014-04-30 DIAGNOSIS — I119 Hypertensive heart disease without heart failure: Secondary | ICD-10-CM | POA: Diagnosis present

## 2014-04-30 DIAGNOSIS — F1920 Other psychoactive substance dependence, uncomplicated: Secondary | ICD-10-CM | POA: Diagnosis present

## 2014-04-30 DIAGNOSIS — I10 Essential (primary) hypertension: Secondary | ICD-10-CM | POA: Diagnosis present

## 2014-04-30 DIAGNOSIS — N189 Chronic kidney disease, unspecified: Secondary | ICD-10-CM | POA: Diagnosis present

## 2014-04-30 DIAGNOSIS — Z598 Other problems related to housing and economic circumstances: Secondary | ICD-10-CM

## 2014-04-30 DIAGNOSIS — I129 Hypertensive chronic kidney disease with stage 1 through stage 4 chronic kidney disease, or unspecified chronic kidney disease: Secondary | ICD-10-CM | POA: Diagnosis present

## 2014-04-30 DIAGNOSIS — I43 Cardiomyopathy in diseases classified elsewhere: Secondary | ICD-10-CM

## 2014-04-30 DIAGNOSIS — E1142 Type 2 diabetes mellitus with diabetic polyneuropathy: Secondary | ICD-10-CM | POA: Diagnosis present

## 2014-04-30 DIAGNOSIS — E11610 Type 2 diabetes mellitus with diabetic neuropathic arthropathy: Secondary | ICD-10-CM | POA: Diagnosis present

## 2014-04-30 DIAGNOSIS — F332 Major depressive disorder, recurrent severe without psychotic features: Secondary | ICD-10-CM | POA: Diagnosis present

## 2014-04-30 DIAGNOSIS — E7800 Pure hypercholesterolemia, unspecified: Secondary | ICD-10-CM | POA: Diagnosis present

## 2014-04-30 DIAGNOSIS — E1169 Type 2 diabetes mellitus with other specified complication: Secondary | ICD-10-CM | POA: Diagnosis present

## 2014-04-30 DIAGNOSIS — F411 Generalized anxiety disorder: Secondary | ICD-10-CM | POA: Diagnosis present

## 2014-04-30 DIAGNOSIS — Z8673 Personal history of transient ischemic attack (TIA), and cerebral infarction without residual deficits: Secondary | ICD-10-CM

## 2014-04-30 DIAGNOSIS — F3289 Other specified depressive episodes: Secondary | ICD-10-CM | POA: Diagnosis present

## 2014-04-30 DIAGNOSIS — Z9119 Patient's noncompliance with other medical treatment and regimen: Secondary | ICD-10-CM

## 2014-04-30 DIAGNOSIS — M549 Dorsalgia, unspecified: Secondary | ICD-10-CM | POA: Diagnosis present

## 2014-04-30 DIAGNOSIS — E1140 Type 2 diabetes mellitus with diabetic neuropathy, unspecified: Secondary | ICD-10-CM | POA: Diagnosis present

## 2014-04-30 DIAGNOSIS — E119 Type 2 diabetes mellitus without complications: Secondary | ICD-10-CM | POA: Diagnosis present

## 2014-04-30 DIAGNOSIS — F329 Major depressive disorder, single episode, unspecified: Secondary | ICD-10-CM | POA: Diagnosis present

## 2014-04-30 DIAGNOSIS — Z5987 Material hardship due to limited financial resources, not elsewhere classified: Secondary | ICD-10-CM

## 2014-04-30 DIAGNOSIS — IMO0001 Reserved for inherently not codable concepts without codable children: Secondary | ICD-10-CM

## 2014-04-30 LAB — GLUCOSE, CAPILLARY
Glucose-Capillary: 223 mg/dL — ABNORMAL HIGH (ref 70–99)
Glucose-Capillary: 248 mg/dL — ABNORMAL HIGH (ref 70–99)

## 2014-04-30 LAB — CBG MONITORING, ED
Glucose-Capillary: 180 mg/dL — ABNORMAL HIGH (ref 70–99)
Glucose-Capillary: 181 mg/dL — ABNORMAL HIGH (ref 70–99)

## 2014-04-30 MED ORDER — POTASSIUM CHLORIDE CRYS ER 20 MEQ PO TBCR
30.0000 meq | EXTENDED_RELEASE_TABLET | Freq: Every day | ORAL | Status: DC
  Administered 2014-04-30 – 2014-05-01 (×2): 30 meq via ORAL
  Filled 2014-04-30 (×8): qty 1

## 2014-04-30 MED ORDER — HYDROXYZINE HCL 25 MG PO TABS
25.0000 mg | ORAL_TABLET | ORAL | Status: DC | PRN
  Filled 2014-04-30: qty 10

## 2014-04-30 MED ORDER — FUROSEMIDE 40 MG PO TABS
40.0000 mg | ORAL_TABLET | Freq: Every day | ORAL | Status: DC
  Administered 2014-05-01 – 2014-05-02 (×2): 40 mg via ORAL
  Filled 2014-04-30 (×2): qty 1
  Filled 2014-04-30: qty 2
  Filled 2014-04-30: qty 1

## 2014-04-30 MED ORDER — BENAZEPRIL HCL 40 MG PO TABS
40.0000 mg | ORAL_TABLET | Freq: Every day | ORAL | Status: DC
  Administered 2014-05-01 – 2014-05-02 (×2): 40 mg via ORAL
  Filled 2014-04-30: qty 1
  Filled 2014-04-30: qty 4
  Filled 2014-04-30 (×2): qty 1

## 2014-04-30 MED ORDER — ACETAMINOPHEN 325 MG PO TABS
650.0000 mg | ORAL_TABLET | Freq: Four times a day (QID) | ORAL | Status: DC | PRN
  Administered 2014-04-30 – 2014-05-01 (×2): 650 mg via ORAL
  Filled 2014-04-30 (×2): qty 2

## 2014-04-30 MED ORDER — INSULIN ASPART 100 UNIT/ML ~~LOC~~ SOLN
0.0000 [IU] | Freq: Three times a day (TID) | SUBCUTANEOUS | Status: DC
  Administered 2014-04-30: 5 [IU] via SUBCUTANEOUS
  Administered 2014-05-01 (×2): 2 [IU] via SUBCUTANEOUS
  Administered 2014-05-01: 5 [IU] via SUBCUTANEOUS
  Administered 2014-05-02 – 2014-05-03 (×4): 2 [IU] via SUBCUTANEOUS
  Administered 2014-05-03: 3 [IU] via SUBCUTANEOUS
  Administered 2014-05-03: 9 [IU] via SUBCUTANEOUS
  Administered 2014-05-04: 2 [IU] via SUBCUTANEOUS
  Administered 2014-05-04 (×2): 5 [IU] via SUBCUTANEOUS
  Administered 2014-05-05: 3 [IU] via SUBCUTANEOUS
  Administered 2014-05-05: 2 [IU] via SUBCUTANEOUS

## 2014-04-30 MED ORDER — CLOPIDOGREL BISULFATE 75 MG PO TABS
75.0000 mg | ORAL_TABLET | Freq: Every day | ORAL | Status: DC
  Administered 2014-05-01 – 2014-05-05 (×5): 75 mg via ORAL
  Filled 2014-04-30 (×8): qty 1

## 2014-04-30 MED ORDER — NICOTINE 21 MG/24HR TD PT24
MEDICATED_PATCH | TRANSDERMAL | Status: AC
  Administered 2014-04-30: 21 mg via TRANSDERMAL
  Filled 2014-04-30: qty 1

## 2014-04-30 MED ORDER — AMLODIPINE BESYLATE 10 MG PO TABS
10.0000 mg | ORAL_TABLET | Freq: Every day | ORAL | Status: DC
  Administered 2014-05-01 – 2014-05-05 (×5): 10 mg via ORAL
  Filled 2014-04-30 (×5): qty 1
  Filled 2014-04-30: qty 2
  Filled 2014-04-30 (×2): qty 1

## 2014-04-30 MED ORDER — TRAZODONE HCL 50 MG PO TABS
50.0000 mg | ORAL_TABLET | Freq: Every evening | ORAL | Status: DC | PRN
  Administered 2014-04-30 – 2014-05-04 (×7): 50 mg via ORAL
  Filled 2014-04-30: qty 6
  Filled 2014-04-30 (×13): qty 1
  Filled 2014-04-30: qty 6
  Filled 2014-04-30: qty 1

## 2014-04-30 MED ORDER — ALUM & MAG HYDROXIDE-SIMETH 200-200-20 MG/5ML PO SUSP: 30.0000 mL | ORAL | Status: DC | PRN

## 2014-04-30 MED ORDER — INSULIN GLARGINE 100 UNIT/ML ~~LOC~~ SOLN
30.0000 [IU] | Freq: Every day | SUBCUTANEOUS | Status: DC
  Administered 2014-04-30 – 2014-05-01 (×2): 30 [IU] via SUBCUTANEOUS

## 2014-04-30 MED ORDER — METOPROLOL TARTRATE 25 MG PO TABS
12.5000 mg | ORAL_TABLET | Freq: Two times a day (BID) | ORAL | Status: DC
  Administered 2014-04-30 – 2014-05-01 (×2): 12.5 mg via ORAL
  Filled 2014-04-30 (×2): qty 1
  Filled 2014-04-30: qty 0.5
  Filled 2014-04-30: qty 1
  Filled 2014-04-30 (×2): qty 0.5
  Filled 2014-04-30: qty 1
  Filled 2014-04-30: qty 0.5

## 2014-04-30 MED ORDER — INSULIN ASPART 100 UNIT/ML ~~LOC~~ SOLN
4.0000 [IU] | Freq: Three times a day (TID) | SUBCUTANEOUS | Status: DC
  Administered 2014-04-30 – 2014-05-01 (×2): 4 [IU] via SUBCUTANEOUS
  Administered 2014-05-01: 07:00:00 via SUBCUTANEOUS
  Administered 2014-05-01 – 2014-05-05 (×12): 4 [IU] via SUBCUTANEOUS

## 2014-04-30 MED ORDER — FLUOXETINE HCL 10 MG PO CAPS
10.0000 mg | ORAL_CAPSULE | Freq: Every day | ORAL | Status: DC
  Administered 2014-05-01 – 2014-05-02 (×2): 10 mg via ORAL
  Filled 2014-04-30 (×4): qty 1

## 2014-04-30 MED ORDER — PANTOPRAZOLE SODIUM 40 MG PO TBEC
40.0000 mg | DELAYED_RELEASE_TABLET | Freq: Every day | ORAL | Status: DC
  Administered 2014-05-01 – 2014-05-05 (×5): 40 mg via ORAL
  Filled 2014-04-30 (×8): qty 1

## 2014-04-30 MED ORDER — MAGNESIUM HYDROXIDE 400 MG/5ML PO SUSP: 30.0000 mL | Freq: Every day | ORAL | Status: DC | PRN

## 2014-04-30 MED ORDER — ATORVASTATIN CALCIUM 40 MG PO TABS
40.0000 mg | ORAL_TABLET | Freq: Every day | ORAL | Status: DC
  Administered 2014-04-30 – 2014-05-04 (×5): 40 mg via ORAL
  Filled 2014-04-30 (×9): qty 1
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
## 2014-04-30 NOTE — Tx Team (Signed)
Initial Interdisciplinary Treatment Plan  PATIENT STRENGTHS: (choose at least two) Ability for insight Active sense of humor Capable of independent living Communication skills General fund of knowledge Motivation for treatment/growth Physical Health Religious Affiliation  PATIENT STRESSORS: Health problems Marital or family conflict Substance abuse   PROBLEM LIST: Problem List/Patient Goals Date to be addressed Date deferred Reason deferred Estimated date of resolution  Substance abuse 04/30/14     Suicidal thoughts 04/30/14     housing 04/30/14                                          DISCHARGE CRITERIA:  Ability to meet basic life and health needs Adequate post-discharge living arrangements Improved stabilization in mood, thinking, and/or behavior Medical problems require only outpatient monitoring Motivation to continue treatment in a less acute level of care Need for constant or close observation no longer present Reduction of life-threatening or endangering symptoms to within safe limits Safe-care adequate arrangements made Verbal commitment to aftercare and medication compliance Withdrawal symptoms are absent or subacute and managed without 24-hour nursing intervention  PRELIMINARY DISCHARGE PLAN: Attend 12-step recovery group Outpatient therapy Placement in alternative living arrangements  PATIENT/FAMIILY INVOLVEMENT: This treatment plan has been presented to and reviewed with the patient, [name], and/or family member, The patient and family have been given the opportunity to ask questions and make suggestions.  [name] 04/30/2014, 3:12 PM

## 2014-04-30 NOTE — ED Notes (Signed)
Pt c/o ha, states it is probably because she is "use to drinking a pint a day". denies dizziness. Tylenol and ativan given. nad noted.

## 2014-04-30 NOTE — ED Notes (Signed)
[name] will be here in about 30 min. to transport to [facility].

## 2014-04-30 NOTE — BH Assessment (Signed)
Per Deanna at [facility] they have patient's referral information but no bed availability at this time.   Shaune Pollack, MS, [place] Assessment Counselor

## 2014-04-30 NOTE — ED Notes (Signed)
RN called Randall Hiss at [facility] with update on pt, bp-153/99, denies ha/dizziness. Pt accepted at [facility] by Dr.Lugo to bed 306-2.

## 2014-04-30 NOTE — Progress Notes (Signed)
Pt's blood pressure has been elevated. Checked before dinner and gave scheduled medications. Rechecked again after dinner. Called and reported to NP Conner. No new orders at this time. No signs and symptoms of distress. Will continue to monitor. Safety maintained on the unit.

## 2014-05-01 DIAGNOSIS — F410 Panic disorder [episodic paroxysmal anxiety] without agoraphobia: Secondary | ICD-10-CM | POA: Diagnosis present

## 2014-05-01 DIAGNOSIS — I10 Essential (primary) hypertension: Secondary | ICD-10-CM

## 2014-05-01 DIAGNOSIS — M549 Dorsalgia, unspecified: Secondary | ICD-10-CM

## 2014-05-01 DIAGNOSIS — F1020 Alcohol dependence, uncomplicated: Secondary | ICD-10-CM | POA: Diagnosis present

## 2014-05-01 DIAGNOSIS — F1920 Other psychoactive substance dependence, uncomplicated: Secondary | ICD-10-CM

## 2014-05-01 DIAGNOSIS — F411 Generalized anxiety disorder: Secondary | ICD-10-CM | POA: Diagnosis present

## 2014-05-01 DIAGNOSIS — F1420 Cocaine dependence, uncomplicated: Secondary | ICD-10-CM | POA: Diagnosis present

## 2014-05-01 DIAGNOSIS — E119 Type 2 diabetes mellitus without complications: Secondary | ICD-10-CM

## 2014-05-01 DIAGNOSIS — F332 Major depressive disorder, recurrent severe without psychotic features: Secondary | ICD-10-CM | POA: Diagnosis present

## 2014-05-01 DIAGNOSIS — F333 Major depressive disorder, recurrent, severe with psychotic symptoms: Secondary | ICD-10-CM | POA: Diagnosis present

## 2014-05-01 LAB — COMPREHENSIVE METABOLIC PANEL
ALT: 20 U/L (ref 0–35)
AST: 17 U/L (ref 0–37)
Albumin: 3.3 g/dL — ABNORMAL LOW (ref 3.5–5.2)
Alkaline Phosphatase: 94 U/L (ref 39–117)
Anion gap: 13 (ref 5–15)
BUN: 25 mg/dL — ABNORMAL HIGH (ref 6–23)
CO2: 24 mEq/L (ref 19–32)
Calcium: 9.9 mg/dL (ref 8.4–10.5)
Chloride: 102 mEq/L (ref 96–112)
Creatinine, Ser: 1.47 mg/dL — ABNORMAL HIGH (ref 0.50–1.10)
GFR calc Af Amer: 47 mL/min — ABNORMAL LOW (ref >90)
GFR calc non Af Amer: 41 mL/min — ABNORMAL LOW (ref >90)
Glucose, Bld: 225 mg/dL — ABNORMAL HIGH (ref 70–99)
Potassium: 3.4 mEq/L — ABNORMAL LOW (ref 3.7–5.3)
Sodium: 139 mEq/L (ref 137–147)
Total Bilirubin: 0.3 mg/dL (ref 0.3–1.2)
Total Protein: 7 g/dL (ref 6.0–8.3)

## 2014-05-01 LAB — GLUCOSE, CAPILLARY
Glucose-Capillary: 122 mg/dL — ABNORMAL HIGH (ref 70–99)
Glucose-Capillary: 148 mg/dL — ABNORMAL HIGH (ref 70–99)
Glucose-Capillary: 204 mg/dL — ABNORMAL HIGH (ref 70–99)
Glucose-Capillary: 283 mg/dL — ABNORMAL HIGH (ref 70–99)

## 2014-05-01 LAB — MAGNESIUM: Magnesium: 1.7 mg/dL (ref 1.5–2.5)

## 2014-05-01 MED ORDER — ACETAMINOPHEN-CODEINE #3 300-30 MG PO TABS
1.0000 | ORAL_TABLET | ORAL | Status: DC | PRN
  Administered 2014-05-01 (×2): 1 via ORAL
  Administered 2014-05-02: 2 via ORAL
  Administered 2014-05-02 – 2014-05-03 (×2): 1 via ORAL
  Administered 2014-05-04 – 2014-05-05 (×2): 2 via ORAL
  Filled 2014-05-01: qty 1
  Filled 2014-05-01 (×2): qty 2
  Filled 2014-05-01 (×3): qty 1

## 2014-05-01 MED ORDER — LORAZEPAM 1 MG PO TABS
1.0000 mg | ORAL_TABLET | Freq: Every day | ORAL | Status: AC
  Administered 2014-05-04: 1 mg via ORAL
  Filled 2014-05-01: qty 1

## 2014-05-01 MED ORDER — ONDANSETRON 4 MG PO TBDP: 4.0000 mg | ORAL_TABLET | Freq: Four times a day (QID) | ORAL | Status: AC | PRN

## 2014-05-01 MED ORDER — LABETALOL HCL 200 MG PO TABS
200.0000 mg | ORAL_TABLET | Freq: Two times a day (BID) | ORAL | Status: DC
  Administered 2014-05-01 – 2014-05-02 (×2): 200 mg via ORAL
  Filled 2014-05-01: qty 2
  Filled 2014-05-01 (×4): qty 1

## 2014-05-01 MED ORDER — LORAZEPAM 1 MG PO TABS
1.0000 mg | ORAL_TABLET | Freq: Three times a day (TID) | ORAL | Status: AC
  Administered 2014-05-02 (×3): 1 mg via ORAL
  Filled 2014-05-01 (×2): qty 1

## 2014-05-01 MED ORDER — FOLIC ACID 1 MG PO TABS
1.0000 mg | ORAL_TABLET | Freq: Every day | ORAL | Status: DC
  Administered 2014-05-01 – 2014-05-05 (×5): 1 mg via ORAL
  Filled 2014-05-01 (×8): qty 1

## 2014-05-01 MED ORDER — NICOTINE 21 MG/24HR TD PT24
21.0000 mg | MEDICATED_PATCH | Freq: Every day | TRANSDERMAL | Status: DC
  Administered 2014-05-01 – 2014-05-05 (×5): 21 mg via TRANSDERMAL
  Filled 2014-05-01 (×5): qty 1
  Filled 2014-05-01: qty 14
  Filled 2014-05-01: qty 1

## 2014-05-01 MED ORDER — GABAPENTIN 100 MG PO CAPS
100.0000 mg | ORAL_CAPSULE | Freq: Three times a day (TID) | ORAL | Status: DC
  Administered 2014-05-01 – 2014-05-05 (×13): 100 mg via ORAL
  Filled 2014-05-01: qty 1
  Filled 2014-05-01: qty 9
  Filled 2014-05-01 (×6): qty 1
  Filled 2014-05-01: qty 9
  Filled 2014-05-01: qty 1
  Filled 2014-05-01: qty 9
  Filled 2014-05-01 (×10): qty 1

## 2014-05-01 MED ORDER — HYDROXYZINE HCL 25 MG PO TABS: 25.0000 mg | ORAL_TABLET | Freq: Four times a day (QID) | ORAL | Status: AC | PRN

## 2014-05-01 MED ORDER — LORAZEPAM 1 MG PO TABS
1.0000 mg | ORAL_TABLET | Freq: Four times a day (QID) | ORAL | Status: AC
  Administered 2014-05-01 (×4): 1 mg via ORAL
  Filled 2014-05-01 (×4): qty 1

## 2014-05-01 MED ORDER — GABAPENTIN 300 MG PO CAPS: 300.0000 mg | ORAL_CAPSULE | Freq: Three times a day (TID) | ORAL | Status: DC

## 2014-05-01 MED ORDER — VITAMIN B-1 100 MG PO TABS
100.0000 mg | ORAL_TABLET | Freq: Every day | ORAL | Status: DC
  Administered 2014-05-02 – 2014-05-05 (×4): 100 mg via ORAL
  Filled 2014-05-01 (×7): qty 1

## 2014-05-01 MED ORDER — CLONIDINE HCL 0.1 MG PO TABS: 0.1000 mg | ORAL_TABLET | ORAL | Status: DC | PRN

## 2014-05-01 MED ORDER — LORAZEPAM 1 MG PO TABS: 1.0000 mg | ORAL_TABLET | Freq: Four times a day (QID) | ORAL | Status: AC | PRN

## 2014-05-01 MED ORDER — LORAZEPAM 1 MG PO TABS
1.0000 mg | ORAL_TABLET | Freq: Two times a day (BID) | ORAL | Status: AC
  Administered 2014-05-03 (×2): 1 mg via ORAL
  Filled 2014-05-01 (×3): qty 1

## 2014-05-01 MED ORDER — THIAMINE HCL 100 MG/ML IJ SOLN
100.0000 mg | Freq: Once | INTRAMUSCULAR | Status: AC
  Administered 2014-05-01: 100 mg via INTRAMUSCULAR
  Filled 2014-05-01: qty 2

## 2014-05-01 MED ORDER — ADULT MULTIVITAMIN W/MINERALS CH: 1.0000 | ORAL_TABLET | Freq: Every day | ORAL | Status: DC

## 2014-05-01 MED ORDER — VITAMIN B-1 100 MG PO TABS: 100.0000 mg | ORAL_TABLET | Freq: Every day | ORAL | Status: DC

## 2014-05-01 MED ORDER — LOPERAMIDE HCL 2 MG PO CAPS: 2.0000 mg | ORAL_CAPSULE | ORAL | Status: AC | PRN

## 2014-05-01 MED ORDER — TRAMADOL HCL 50 MG PO TABS
50.0000 mg | ORAL_TABLET | Freq: Two times a day (BID) | ORAL | Status: DC
  Administered 2014-05-02: 50 mg via ORAL
  Filled 2014-05-01: qty 1

## 2014-05-01 MED ORDER — TRAMADOL HCL 50 MG PO TABS
50.0000 mg | ORAL_TABLET | Freq: Two times a day (BID) | ORAL | Status: DC | PRN
  Administered 2014-05-01: 50 mg via ORAL
  Filled 2014-05-01: qty 1

## 2014-05-01 MED ORDER — ADULT MULTIVITAMIN W/MINERALS CH
1.0000 | ORAL_TABLET | Freq: Every day | ORAL | Status: DC
  Administered 2014-05-01 – 2014-05-05 (×5): 1 via ORAL
  Filled 2014-05-01 (×8): qty 1

## 2014-05-01 NOTE — Progress Notes (Signed)
Recreation Therapy Notes  Animal-Assisted Activity/Therapy (AAA/T) Program Checklist/Progress Notes Patient Eligibility Criteria Checklist & Daily Group note for Rec Tx Intervention  Date: 07.07.2015 Time: 2:45pm Location: [address]   AAA/T Program Assumption of Risk Form signed by Patient/ or Parent Legal Guardian yes  Patient is free of allergies or sever asthma yes  Patient reports no fear of animals yes  Patient reports no history of cruelty to animals yes   Patient understands his/her participation is voluntary yes  Patient washes hands before animal contact yes  Patient washes hands after animal contact yes  Behavioral Response: Appropriate, Attentive  Education: Hand Washing, Appropriate Animal Interaction   Education Outcome: Acknowledges understanding   Clinical Observations/Feedback: Patient interacted appropriately with therapy dog. Patient asked appropriate questions about therapy dog and his training.   Laureen Ochs July Nickson, LRT/CTRS        Vianka Ertel L 05/01/2014 3:59 PM

## 2014-05-01 NOTE — [facility] Group Notes (Signed)
[facility] LCSW Group Therapy  05/01/2014 3:43 PM  Type of Therapy:  Group Therapy  Participation Level:  Active  Participation Quality:  Attentive  Affect:  Appropriate  Cognitive:  Alert and Oriented  Insight:  Engaged  Engagement in Therapy:  Improving  Modes of Intervention:  Confrontation, Discussion, Education, Exploration, Problem-solving, Rapport Building, Socialization and Support  Summary of Progress/Problems: [facility] Speaker came to talk about his personal journey with substance abuse and addiction. Hollin processed ways by which to relate to the speaker. [place] speaker provided handouts and educational information pertaining to groups and services offered by the [facility]. She thanked the speaker for sharing his personal story when group concluded.    Smart, Haide Klinker LCSWA  05/01/2014, 3:43 PM

## 2014-05-01 NOTE — Progress Notes (Signed)
Pt's blood pressure has been elevated today and Medical MD evaluated with medication changes. Pt received new blood pressure medication and pain medication. New order for clonidine received. Went to recheck vitals and pt is currently off the unit with visitors. Will report to oncoming RN Maudie Mercury for followup. Safety maintained.
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
## 2014-05-01 NOTE — Progress Notes (Signed)
Adult Psychoeducational Group Note  Date:  05/01/2014 Time:  9:46 PM  Group Topic/Focus:  Wrap-Up Group:   The focus of this group is to help patients review their daily goal of treatment and discuss progress on daily workbooks.  Participation Level:  Active  Participation Quality:  Appropriate and Attentive  Affect:  Appropriate  Cognitive:  Appropriate  Insight: Appropriate  Engagement in Group:  Engaged  Modes of Intervention:  Discussion  Additional Comments:  Pt attended the wrap up group this evening and remained appropriate and engaged throughout the duration of the group. Pt ranked her day as a 10 because she got to visit with her daughter and her grandson.   Beryle Beams 05/01/2014, 9:46 PM
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
## 2014-05-01 NOTE — Progress Notes (Signed)
D: Pt's mood is anxious and affect is animated.  Pt rates pain as 10/10.  Pt states pt has chronic pain secondary to arthritis.    A: Patient given emotional support from RN. Patient encouraged to come to staff with concerns and/or questions. Patient's medication routine continued. Patient's orders and plan of care reviewed. Will continue to monitor patient q15 minutes for safety.  Medication administered for pain.     R: Patient remains appropriate and cooperative.  Pain medication ineffective.
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
## 2014-05-02 DIAGNOSIS — E876 Hypokalemia: Secondary | ICD-10-CM

## 2014-05-02 LAB — GLUCOSE, CAPILLARY
Glucose-Capillary: 148 mg/dL — ABNORMAL HIGH (ref 70–99)
Glucose-Capillary: 150 mg/dL — ABNORMAL HIGH (ref 70–99)
Glucose-Capillary: 128 mg/dL — ABNORMAL HIGH (ref 70–99)
Glucose-Capillary: 179 mg/dL — ABNORMAL HIGH (ref 70–99)

## 2014-05-02 LAB — HEMOGLOBIN A1C
Hgb A1c MFr Bld: 7.2 % — ABNORMAL HIGH (ref <5.7)
Mean Plasma Glucose: 160 mg/dL — ABNORMAL HIGH (ref <117)

## 2014-05-02 MED ORDER — POTASSIUM CHLORIDE CRYS ER 10 MEQ PO TBCR
30.0000 meq | EXTENDED_RELEASE_TABLET | Freq: Two times a day (BID) | ORAL | Status: DC
  Administered 2014-05-02 – 2014-05-05 (×7): 30 meq via ORAL
  Filled 2014-05-02 (×11): qty 3

## 2014-05-02 MED ORDER — INSULIN GLARGINE 100 UNIT/ML ~~LOC~~ SOLN
35.0000 [IU] | Freq: Every day | SUBCUTANEOUS | Status: DC
  Administered 2014-05-02 – 2014-05-04 (×3): 35 [IU] via SUBCUTANEOUS

## 2014-05-02 MED ORDER — FLUOXETINE HCL 20 MG PO CAPS
20.0000 mg | ORAL_CAPSULE | Freq: Every day | ORAL | Status: DC
  Administered 2014-05-03 – 2014-05-05 (×3): 20 mg via ORAL
  Filled 2014-05-02: qty 1
  Filled 2014-05-02: qty 3
  Filled 2014-05-02 (×3): qty 1

## 2014-05-02 MED ORDER — CLONIDINE HCL 0.1 MG PO TABS
0.1000 mg | ORAL_TABLET | Freq: Three times a day (TID) | ORAL | Status: DC
  Administered 2014-05-02 – 2014-05-05 (×10): 0.1 mg via ORAL
  Filled 2014-05-02 (×15): qty 1

## 2014-05-02 MED ORDER — LABETALOL HCL 200 MG PO TABS
200.0000 mg | ORAL_TABLET | Freq: Three times a day (TID) | ORAL | Status: DC
  Administered 2014-05-02 – 2014-05-05 (×10): 200 mg via ORAL
  Filled 2014-05-02 (×15): qty 1

## 2014-05-02 MED ORDER — TRAMADOL HCL 50 MG PO TABS: 50.0000 mg | ORAL_TABLET | Freq: Two times a day (BID) | ORAL | Status: DC | PRN

## 2014-05-02 MED ORDER — TRAMADOL HCL 50 MG PO TABS
50.0000 mg | ORAL_TABLET | Freq: Four times a day (QID) | ORAL | Status: DC | PRN
  Administered 2014-05-02 – 2014-05-05 (×8): 50 mg via ORAL
  Filled 2014-05-02 (×8): qty 1

## 2014-05-02 NOTE — BHH Group Notes (Signed)
[facility] LCSW Aftercare Discharge Planning Group Note   05/02/2014 11:06 AM  Participation Quality:  Appropriate   Mood/Affect:  Appropriate  Depression Rating:  0  Anxiety Rating:  5  Thoughts of Suicide:  No Will you contract for safety?   NA  Current AVH:  No  Plan for Discharge/Comments:  Pt reports that she is still hoping for [facility] admission-referral sent on 05/01/14. She reports doing well otherwise with minimal withdrawal symptoms.   Transportation Means: unknown at this time.   Supports: some family supports identified   Proofreader, Research officer, trade union

## 2014-05-02 NOTE — BHH Suicide Risk Assessment (Addendum)
[facility] INPATIENT:  Family/Significant Other Suicide Prevention Education  Suicide Prevention Education:  Contact Attempts: [name] (pt's daugher) [phone] has been identified by the patient as the family member/significant other with whom the patient will be residing, and identified as the person(s) who will aid the patient in the event of a mental health crisis.  With written consent from the patient, two attempts were made to provide suicide prevention education, prior to and/or following the patient's discharge.  We were unsuccessful in providing suicide prevention education.  A suicide education pamphlet was given to the patient to share with family/significant other.  Date and time of first attempt: 05/02/14 3:29PM (generic voicemail left requesting call back)  Date and time of second attempt:05/03/14 3:26PM (second voicemail left requesting call back)   Smart, Alicia Amel  05/02/2014, 3:29 PM    Pt's daughter called back. SPE completed and CSW discussed aftercare. She is going to think about allowing her mother to stay with her until she finds her own place in [place]. Pt's daughter verbalized understanding of SPE information and has no concerns regarding SI.   [place], [place]  05/03/2014 3:47 PM

## 2014-05-02 NOTE — Progress Notes (Signed)
Patient ID: [name], female   DOB: 1966/03/04, 48 y.o.   MRN: [id number]   D: Pt approached Probation officer and asked if writer had spoke to the doctor about "her pain medicine".  Writer informed pt that per report the doctor had already addressed her pain issues and had prescribed new medicine. Pt acknowledged knowing about the new orders. Due to CBG of 283 writer informed pt that she would be given a salad for her snack. Writer also spoke to pt about her breakfast on Tuesday morning. Writer observed pt having biscuit and gravy, with 2 full cups of orange juice, and a fruit bowl. Informed pt that writer would be observing her in the morning during breakfast.   A:  Support and encouragement was offered. 15 min checks continued for safety.  R: Pt remains safe.
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
## 2014-05-02 NOTE — [facility] Group Notes (Signed)
[facility] LCSW Group Therapy  05/02/2014 2:55 PM  Type of Therapy:  Group Therapy  Participation Level:  None  Participation Quality:  Drowsy  Affect:  Lethargic  Cognitive:  Lacking  Insight:  None  Engagement in Therapy:  None  Modes of Intervention:  Confrontation, Discussion, Education, Exploration, Problem-solving, Rapport Building, Socialization and Support  Summary of Progress/Problems: Emotion Regulation: This group focused on both positive and negative emotion identification and allowed group members to process ways to identify feelings, regulate negative emotions, and find healthy ways to manage internal/external emotions. Group members were asked to reflect on a time when their reaction to an emotion led to a negative outcome and explored how alternative responses using emotion regulation would have benefited them. Group members were also asked to discuss a time when emotion regulation was utilized when a negative emotion was experienced. Taylen was sleeping throughout group and had to be woken up by CSW and group members due to loud snoring. Mariell did not contribute to group discussion due to her inability to remain awake/drowsiness, demonstrating no progress in the group setting at this time.     Smart, Jrue Yambao LCSWA  05/02/2014, 2:55 PM

## 2014-05-02 NOTE — Progress Notes (Signed)
Pt has been up and active in the milieu today.  She rated both her depression and hopelessness a 10 and her anxiety 8 on he self-inventory. She denies any S/H ideation or A/V/H.  She is hoping to get into [facility] from here. She did talk about how she forgets to take her medications.  We talk about different options she could try.  She voiced understanding.
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
## 2014-05-03 DIAGNOSIS — F1994 Other psychoactive substance use, unspecified with psychoactive substance-induced mood disorder: Secondary | ICD-10-CM

## 2014-05-03 LAB — GLUCOSE, CAPILLARY
Glucose-Capillary: 128 mg/dL — ABNORMAL HIGH (ref 70–99)
Glucose-Capillary: 192 mg/dL — ABNORMAL HIGH (ref 70–99)
Glucose-Capillary: 201 mg/dL — ABNORMAL HIGH (ref 70–99)
Glucose-Capillary: 224 mg/dL — ABNORMAL HIGH (ref 70–99)
Glucose-Capillary: >600 mg/dL (ref 70–99)

## 2014-05-03 LAB — COMPREHENSIVE METABOLIC PANEL
ALT: 17 U/L (ref 0–35)
AST: 14 U/L (ref 0–37)
Albumin: 3.1 g/dL — ABNORMAL LOW (ref 3.5–5.2)
Alkaline Phosphatase: 92 U/L (ref 39–117)
Anion gap: 13 (ref 5–15)
BUN: 27 mg/dL — ABNORMAL HIGH (ref 6–23)
CO2: 22 mEq/L (ref 19–32)
Calcium: 9.7 mg/dL (ref 8.4–10.5)
Chloride: 101 mEq/L (ref 96–112)
Creatinine, Ser: 1.23 mg/dL — ABNORMAL HIGH (ref 0.50–1.10)
GFR calc Af Amer: 59 mL/min — ABNORMAL LOW (ref >90)
GFR calc non Af Amer: 51 mL/min — ABNORMAL LOW (ref >90)
Glucose, Bld: 148 mg/dL — ABNORMAL HIGH (ref 70–99)
Potassium: 4.1 mEq/L (ref 3.7–5.3)
Sodium: 136 mEq/L — ABNORMAL LOW (ref 137–147)
Total Bilirubin: 0.2 mg/dL — ABNORMAL LOW (ref 0.3–1.2)
Total Protein: 6.6 g/dL (ref 6.0–8.3)

## 2014-05-03 NOTE — Progress Notes (Signed)
Recreation Therapy Notes  Animal-Assisted Activity/Therapy (AAA/T) Program Checklist/Progress Notes Patient Eligibility Criteria Checklist & Daily Group note for Rec Tx Intervention  Date: 07.09.2015 Time: 2:45pm Location: [address]    AAA/T Program Assumption of Risk Form signed by Patient/ or Parent Legal Guardian yes  Patient is free of allergies or sever asthma yes  Patient reports no fear of animals yes  Patient reports no history of cruelty to animals yes   Patient understands his/her participation is voluntary yes  Patient washes hands before animal contact yes  Patient washes hands after animal contact yes  Behavioral Response: Engaged, Appropriate   Education: Hand Washing, Appropriate Animal Interaction   Education Outcome: Acknowledges understanding   Clinical Observations/Feedback: Patient interacted appropriately with therapy dog and peers in session.   Laureen Ochs Keith Felten, LRT/CTRS        Deivi Huckins L 05/03/2014 4:06 PM

## 2014-05-03 NOTE — [facility] Group Notes (Signed)
0900 nursing orientation group   The focus of this group is to educate the patient on the purpose and policies of crisis stabilization and provide a format to answer questions about their admission.  The group details unit policies and expectations of patients while admitted.  Pt did not attend this group

## 2014-05-03 NOTE — Progress Notes (Signed)
Patient did attend the evening karaoke group. Pt was engaged and supportive but did not participate by singing a song.    
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
## 2014-05-03 NOTE — BHH Group Notes (Signed)
Adult Psychoeducational Group Note  Date:  05/03/2014 Time:  0900  Group Topic/Focus:  Nursing Orientation  Participation Level:  Did Not Attend  [name] 05/03/2014, 2:43 PM

## 2014-05-03 NOTE — Progress Notes (Signed)
D: Patient denies SI/HI or AVH.  Pt. Is pleasant and cooperative but sleepy.  She reports some difficulty with sleep.  Pt. Makes effort to attend group but states she is not "used to all of the medication they are giving me".  A: Patient given emotional support from RN. Patient encouraged to come to staff with concerns and/or questions. Patient's medication routine continued. Patient's orders and plan of care reviewed.   R: Patient remains appropriate and cooperative. Will continue to monitor patient q15 minutes for safety.

## 2014-05-04 LAB — BASIC METABOLIC PANEL
Anion gap: 12 (ref 5–15)
BUN: 21 mg/dL (ref 6–23)
CO2: 22 mEq/L (ref 19–32)
Calcium: 9.5 mg/dL (ref 8.4–10.5)
Chloride: 106 mEq/L (ref 96–112)
Creatinine, Ser: 1.11 mg/dL — ABNORMAL HIGH (ref 0.50–1.10)
GFR calc Af Amer: 66 mL/min — ABNORMAL LOW (ref >90)
GFR calc non Af Amer: 57 mL/min — ABNORMAL LOW (ref >90)
Glucose, Bld: 126 mg/dL — ABNORMAL HIGH (ref 70–99)
Potassium: 4.1 mEq/L (ref 3.7–5.3)
Sodium: 140 mEq/L (ref 137–147)

## 2014-05-04 LAB — GLUCOSE, CAPILLARY
Glucose-Capillary: 203 mg/dL — ABNORMAL HIGH (ref 70–99)
Glucose-Capillary: 142 mg/dL — ABNORMAL HIGH (ref 70–99)
Glucose-Capillary: 221 mg/dL — ABNORMAL HIGH (ref 70–99)
Glucose-Capillary: 157 mg/dL — ABNORMAL HIGH (ref 70–99)

## 2014-05-04 MED ORDER — IBUPROFEN 600 MG PO TABS
600.0000 mg | ORAL_TABLET | Freq: Four times a day (QID) | ORAL | Status: DC | PRN
  Administered 2014-05-04 – 2014-05-05 (×2): 600 mg via ORAL
  Filled 2014-05-04 (×2): qty 1

## 2014-05-04 MED ORDER — LIDOCAINE 5 % EX PTCH
2.0000 | MEDICATED_PATCH | CUTANEOUS | Status: DC
  Administered 2014-05-04: 2 via TRANSDERMAL
  Filled 2014-05-04 (×3): qty 2
  Filled 2014-05-04: qty 6

## 2014-05-04 NOTE — Clinical Social Work Note (Signed)
CSW met with pt individually to discuss d/c. Pt scheduled for d/c on Saturday and is requesting [facility] follow-up: message left for Ann to contact pt directly to schedule orientation at her d/c or call CSW back before 4pm today to schedule this. Per pt request, CSW left message for her daughter asking for her to pick up pt anytime after 1pm tomorrow. Pt provided with [facility] info and [facility] information/in chart.    Smart, LCSWA  05/04/2014 2:33 PM  
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
## 2014-05-04 NOTE — BHH Group Notes (Signed)
[facility] LCSW Aftercare Discharge Planning Group Note   05/04/2014 9:53 AM  Participation Quality:  DID NOT ATTEND-pt in room sleeping.   Smart, [employer]

## 2014-05-04 NOTE — Tx Team (Signed)
Interdisciplinary Treatment Plan Update (Adult)  Date: 05/04/2014 11:09 AM  Time Reviewed: Progress in Treatment:  Attending groups: Yes  Participating in groups:  Yes  Taking medication as prescribed: Yes  Tolerating medication: Yes  Family/Significant othe contact made: SPE completed with pt's daughter.   Patient understands diagnosis: Yes, AEB seeking treatment for ETOH detox/crack cocaine abuse, mood stabilization, med management, and passive SI.  Discussing patient identified problems/goals with staff: Yes  Medical problems stabilized or resolved: Yes  Denies suicidal/homicidal ideation: Yes during self report.  Patient has not harmed self or Others: Yes  New problem(s) identified:  Discharge Plan or Barriers:  Update: pt not able to go inpatient due to insurance. She plans to live in [place] with her daughter temporarily and is interested in [facility]/med management in [place] area. CSW assessing.  Additional comments:      Reason for Continuation of Hospitalization: Ativan taper-withdrawals Mood stabilization Med management  Estimated length of stay: 2-4 days  For review of initial/current patient goals, please see plan of care.  Attendees:  Patient:    Family:    Physician: Dr. Parke Poisson MD 05/04/2014 11:09 AM   Nursing: Oletta Darter RN 05/04/2014 11:09 AM   Clinical Social Worker [place], [place]  05/04/2014 11:09 AM   Other: Seth Bake RN  05/04/2014 11:09 AM   Other: Hardie Pulley PA   Other: Gerline Legacy Nurse CM  05/04/2014 11:09 AM   Other:    Scribe for Treatment Team:  [place] LCSWA ,05/04/2014 11:09 AM

## 2014-05-04 NOTE — BHH Group Notes (Signed)
Adult Psychoeducational Group Note  Date:  05/04/2014 Time:  11:08 PM  Group Topic/Focus:  AA Meeting  Participation Level:  Did Not Attend  Participation Quality:  None  Affect:  None  Cognitive:  None  Insight: None  Engagement in Group:  None  Modes of Intervention:  Discussion and Education  Additional Comments:  [name] did not attend group.  [name] A 05/04/2014, 11:08 PM

## 2014-05-04 NOTE — Progress Notes (Signed)
D.  Pt pleasant on approach, denies complaints other than continued chronic back pain.  Looking forward to discharge tomorrow.  Denies SI/HI/hallucinations at this time.  Remained in bed during evening AA group, interacting appropriately with peers on the unit.  A.  Support and encouragement offered, medication given as ordered for back pain.  R.  Pt remains safe on unit, will continue to monitor.

## 2014-05-04 NOTE — Progress Notes (Signed)
Patient ID: [name], female   DOB: 02/20/1966, 48 y.o.   MRN: [id number] D: pt. In bed, eyes closed, respirations even. A: Writer observed for symptoms of distress. Staff will monitor q10min for safety. R: Pt. Is safe on the unit, no distress noted, respirations unlabored.

## 2014-05-04 NOTE — Progress Notes (Signed)
Patient ID: [name], female   DOB: 13-Jan-1966, 48 y.o.   MRN: [id number]  D: Patient smiling and interacting well with other peers. Smiling and joking around with peers this am. Reports some improvement with mood. No SI at this time. Minimal withdrawal symptoms at this time. No complaints at present except with back pain. A: Staff will monitor on q 15 minute checks, follow treatment plan, and give meds as ordered. R: Cooperative on the unit and taking medications as ordered.

## 2014-05-04 NOTE — Progress Notes (Addendum)
Pt was given 50 mg of tramadol for back pain a 9/10. Pt c/o her equilibrium is off.NP made aware.

## 2014-05-04 NOTE — Progress Notes (Signed)
[facility] Adult Case Management Discharge Plan :  Will you be returning to the same living situation after discharge: No.Pt plans to live with her daughter temporarily at d/c.  At discharge, do you have transportation home?:Yes,  daughter will pick up pt SATURDAY after 1pm.  Do you have the ability to pay for your medications:Yes,  [employer]   Release of information consent forms completed and submitted to Medical Records by CSW.  Patient to Follow up at: Follow-up Information   Follow up with [facility] On 05/04/2014. Lelon Frohlich will contact you directly after discharge to schedule orientation for [facility]. If you have any problems, please contact Clinical Social Worker, Water quality scientist at [phone]. )    Contact information:   [place], [place] [address] Phone: [phone] Fax: [phone]      Patient denies SI/HI:   Yes,  during group/self report.     Safety Planning and Suicide Prevention discussed:  Yes,  SPE completed with pt's daughter. SPI pamphlet provided to pt and she was encouraged to share information with support network, ask questions, and talk about any concerns relating to SPE.  Smart, Tyrian Peart LCSWA  05/04/2014, 2:36 PM

## 2014-05-04 NOTE — Progress Notes (Signed)
Patient ID: [name] [name], female   DOB: 1966/05/22, 48 y.o.   MRN: [id number] [facility] MD Progress Note  05/04/2014 5:37 PM [name] UNCAPHER  MRN:  [id number] Subjective: Pt seen and chart reviewed. Pt denies SI, HI, and AVH, contracts for safety. Pt is upset that she was declined at [facility] and states "they didn't find me another place so I think with my insurance, the only option is outpatient". Pt is enthusiastic about treatment, but would prefer inpatient treatment vs. Outpatient. Pt complains about her lower back pain and states that she wants medication adjustments for this.   Diagnosis:   DSM5: Schizophrenia Disorders:  none Obsessive-Compulsive Disorders:  none Trauma-Stressor Disorders:  Posttraumatic Stress Disorder (309.81) Substance/Addictive Disorders:  Alcohol Related Disorder - Severe (303.90), Cocaine related disorder Depressive Disorders:  Major Depressive Disorder - Moderate (296.22) Total Time spent with patient: 30 minutes  Axis I: Substance Induced Mood Disorder  ADL's:  Intact  Sleep: Poor  Appetite:  Fair  Suicidal Ideation:  Plan:  denies Intent:  denies Means:  denies Homicidal Ideation:  Plan:  denies Intent:  denies Means:  denies AEB (as evidenced by):  Psychiatric Specialty Exam: Physical Exam  Review of Systems  Constitutional: Positive for malaise/fatigue.  HENT: Negative.   Eyes: Negative.   Respiratory: Negative.   Cardiovascular: Negative.   Gastrointestinal: Negative.   Genitourinary: Negative.   Musculoskeletal: Positive for back pain, joint pain and myalgias.  Skin: Negative.   Neurological: Positive for weakness.  Endo/Heme/Allergies: Negative.   Psychiatric/Behavioral: Positive for substance abuse. The patient is nervous/anxious.     Blood pressure 132/92, pulse 80, temperature 98.3 F (36.8 C), temperature source Oral, resp. rate 16, height 4' 10.27" (1.48 m), weight 83.915 kg (185 lb), last menstrual period 04/24/2014.Body mass  index is 38.31 kg/(m^2).  General Appearance: Disheveled  Eye Sport and exercise psychologist::  Fair  Speech:  Clear and Coherent  Volume:  Decreased  Mood:  Anxious and worried, hurting  Affect:  Appropriate  Thought Process:  Coherent and Goal Directed  Orientation:  Full (Time, Place, and Person)  Thought Content:  symptoms worries concerns wanting to go to rehab  Suicidal Thoughts:  No  Homicidal Thoughts:  No  Memory:  Immediate;   Fair Recent;   Fair Remote;   Fair  Judgement:  Fair  Insight:  Present  Psychomotor Activity:  Restlessness  Concentration:  Fair  Recall:  [employer] of Knowledge:NA  Language: Fair  Akathisia:  No  Handed:    AIMS (if indicated):     Assets:  Desire for Improvement  Sleep:  Number of Hours: 6.5   Musculoskeletal: Strength & Muscle Tone: within normal limits Gait & Station: normal Patient leans: N/A  Current Medications: Current Facility-Administered Medications  Medication Dose Route Frequency Provider Last Rate Last Dose  . acetaminophen (TYLENOL) tablet 650 mg  650 mg Oral Q6H PRN Encarnacion Slates, NP   650 mg at 05/01/14 0903  . acetaminophen-codeine (TYLENOL #3) 300-30 MG per tablet 1-2 tablet  1-2 tablet Oral Q4H PRN Eugenie Filler, MD   2 tablet at 05/04/14 1215  . alum & mag hydroxide-simeth (MAALOX/MYLANTA) 200-200-20 MG/5ML suspension 30 mL  30 mL Oral Q4H PRN Encarnacion Slates, NP      . amLODipine (NORVASC) tablet 10 mg  10 mg Oral Daily Encarnacion Slates, NP   10 mg at 05/04/14 0812  . atorvastatin (LIPITOR) tablet 40 mg  40 mg Oral q1800 Encarnacion Slates, NP  40 mg at 05/04/14 1620  . cloNIDine (CATAPRES) tablet 0.1 mg  0.1 mg Oral TID Reyne Dumas, MD   0.1 mg at 05/04/14 1618  . clopidogrel (PLAVIX) tablet 75 mg  75 mg Oral Q breakfast Encarnacion Slates, NP   75 mg at 05/04/14 0807  . FLUoxetine (PROZAC) capsule 20 mg  20 mg Oral Daily Nicholaus Bloom, MD   20 mg at 05/04/14 1115  . folic acid (FOLVITE) tablet 1 mg  1 mg Oral Daily Eugenie Filler, MD   1 mg  at 05/04/14 0807  . gabapentin (NEURONTIN) capsule 100 mg  100 mg Oral TID Nicholaus Bloom, MD   100 mg at 05/04/14 1617  . hydrOXYzine (ATARAX/VISTARIL) tablet 25 mg  25 mg Oral Q4H PRN Encarnacion Slates, NP      . insulin aspart (novoLOG) injection 0-15 Units  0-15 Units Subcutaneous TID WC Encarnacion Slates, NP   5 Units at 05/04/14 1700  . insulin aspart (novoLOG) injection 4 Units  4 Units Subcutaneous TID WC Encarnacion Slates, NP   4 Units at 05/04/14 1700  . insulin glargine (LANTUS) injection 35 Units  35 Units Subcutaneous QHS Reyne Dumas, MD   35 Units at 05/03/14 2153  . labetalol (NORMODYNE) tablet 200 mg  200 mg Oral TID Reyne Dumas, MD   200 mg at 05/04/14 1618  . magnesium hydroxide (MILK OF MAGNESIA) suspension 30 mL  30 mL Oral Daily PRN Encarnacion Slates, NP      . multivitamin with minerals tablet 1 tablet  1 tablet Oral Daily Nicholaus Bloom, MD   1 tablet at 05/04/14 [phone]  . nicotine (NICODERM CQ - dosed in mg/24 hours) patch 21 mg  21 mg Transdermal Daily Nicholaus Bloom, MD   21 mg at 05/04/14 0813  . pantoprazole (PROTONIX) EC tablet 40 mg  40 mg Oral Daily Encarnacion Slates, NP   40 mg at 05/04/14 0807  . potassium chloride (K-DUR,KLOR-CON) CR tablet 30 mEq  30 mEq Oral BID Reyne Dumas, MD   30 mEq at 05/04/14 1617  . thiamine (VITAMIN B-1) tablet 100 mg  100 mg Oral Daily Nicholaus Bloom, MD   100 mg at 05/04/14 0807  . traMADol (ULTRAM) tablet 50 mg  50 mg Oral Q6H PRN Reyne Dumas, MD   50 mg at 05/04/14 1620  . traZODone (DESYREL) tablet 50 mg  50 mg Oral QHS,MR X 1 Laverle Hobby, PA-C   50 mg at 05/03/14 2300    Lab Results:  Results for orders placed during the hospital encounter of 04/30/14 (from the past 48 hour(s))  GLUCOSE, CAPILLARY     Status: Abnormal   Collection Time    05/02/14  9:04 PM      Result Value Ref Range   Glucose-Capillary 179 (*) 70 - 99 mg/dL   Comment 1 Notify RN    GLUCOSE, CAPILLARY     Status: Abnormal   Collection Time    05/03/14  6:08 AM      Result  Value Ref Range   Glucose-Capillary 128 (*) 70 - 99 mg/dL  COMPREHENSIVE METABOLIC PANEL     Status: Abnormal   Collection Time    05/03/14  6:20 AM      Result Value Ref Range   Sodium 136 (*) 137 - 147 mEq/L   Potassium 4.1  3.7 - 5.3 mEq/L   Comment: DELTA CHECK NOTED     REPEATED  TO VERIFY     NO VISIBLE HEMOLYSIS   Chloride 101  96 - 112 mEq/L   CO2 22  19 - 32 mEq/L   Glucose, Bld 148 (*) 70 - 99 mg/dL   BUN 27 (*) 6 - 23 mg/dL   Creatinine, Ser 1.23 (*) 0.50 - 1.10 mg/dL   Calcium 9.7  8.4 - 10.5 mg/dL   Total Protein 6.6  6.0 - 8.3 g/dL   Albumin 3.1 (*) 3.5 - 5.2 g/dL   AST 14  0 - 37 U/L   ALT 17  0 - 35 U/L   Alkaline Phosphatase 92  39 - 117 U/L   Total Bilirubin 0.2 (*) 0.3 - 1.2 mg/dL   GFR calc non Af Amer 51 (*) >90 mL/min   GFR calc Af Amer 59 (*) >90 mL/min   Comment: (NOTE)     The eGFR has been calculated using the CKD EPI equation.     This calculation has not been validated in all clinical situations.     eGFR's persistently <90 mL/min signify possible Chronic Kidney     Disease.   Anion gap 13  5 - 15   Comment: Performed at [facility], CAPILLARY     Status: Abnormal   Collection Time    05/03/14 11:58 AM      Result Value Ref Range   Glucose-Capillary 192 (*) 70 - 99 mg/dL  GLUCOSE, CAPILLARY     Status: Abnormal   Collection Time    05/03/14  4:36 PM      Result Value Ref Range   Glucose-Capillary 201 (*) 70 - 99 mg/dL  GLUCOSE, CAPILLARY     Status: Abnormal   Collection Time    05/03/14  9:42 PM      Result Value Ref Range   Glucose-Capillary >600 (*) 70 - 99 mg/dL  GLUCOSE, CAPILLARY     Status: Abnormal   Collection Time    05/03/14  9:46 PM      Result Value Ref Range   Glucose-Capillary 224 (*) 70 - 99 mg/dL  BASIC METABOLIC PANEL     Status: Abnormal   Collection Time    05/04/14  6:20 AM      Result Value Ref Range   Sodium 140  137 - 147 mEq/L   Potassium 4.1  3.7 - 5.3 mEq/L   Chloride 106  96 -  112 mEq/L   CO2 22  19 - 32 mEq/L   Glucose, Bld 126 (*) 70 - 99 mg/dL   BUN 21  6 - 23 mg/dL   Creatinine, Ser 1.11 (*) 0.50 - 1.10 mg/dL   Calcium 9.5  8.4 - 10.5 mg/dL   GFR calc non Af Amer 57 (*) >90 mL/min   GFR calc Af Amer 66 (*) >90 mL/min   Comment: (NOTE)     The eGFR has been calculated using the CKD EPI equation.     This calculation has not been validated in all clinical situations.     eGFR's persistently <90 mL/min signify possible Chronic Kidney     Disease.   Anion gap 12  5 - 15   Comment: Performed at [place], [facility]     Status: Abnormal   Collection Time    05/04/14  6:30 AM      Result Value Ref Range   Glucose-Capillary 203 (*) 70 - 99 mg/dL  GLUCOSE, CAPILLARY     Status:  Abnormal   Collection Time    05/04/14 12:07 PM      Result Value Ref Range   Glucose-Capillary 142 (*) 70 - 99 mg/dL  GLUCOSE, CAPILLARY     Status: Abnormal   Collection Time    05/04/14  4:24 PM      Result Value Ref Range   Glucose-Capillary 221 (*) 70 - 99 mg/dL    Physical Findings: AIMS: Facial and Oral Movements Muscles of Facial Expression: None, normal Lips and Perioral Area: None, normal Jaw: None, normal Tongue: None, normal,Extremity Movements Upper (arms, wrists, hands, fingers): None, normal Lower (legs, knees, ankles, toes): None, normal, Trunk Movements Neck, shoulders, hips: None, normal, Overall Severity Severity of abnormal movements (highest score from questions above): None, normal Incapacitation due to abnormal movements: None, normal Patient's awareness of abnormal movements (rate only patient's report): No Awareness, Dental Status Current problems with teeth and/or dentures?: No Does patient usually wear dentures?: Yes  CIWA:  CIWA-Ar Total: 0 COWS:  COWS Total Score: 4  Treatment Plan Summary: Daily contact with patient to assess and evaluate symptoms and progress in treatment Medication management  Plan:  Supportive approach/coping skills/relapse prevention           Complete detox, attempt to find inpatient vs. Outpatient with limited insurance plan *Lidoderm patch x2 once daily to lower back for chronic back pain             Medical Decision Making Problem Points:  Review of psycho-social stressors (1) Data Points:  Review of medication regiment & side effects (2)  I certify that inpatient services furnished can reasonably be expected to improve the patient's condition.   Benjamine Mola, FNP-BC 05/04/2014, 5:37 PM

## 2014-05-05 LAB — GLUCOSE, CAPILLARY
Glucose-Capillary: 143 mg/dL — ABNORMAL HIGH (ref 70–99)
Glucose-Capillary: 152 mg/dL — ABNORMAL HIGH (ref 70–99)

## 2014-05-05 MED ORDER — INSULIN ASPART 100 UNIT/ML ~~LOC~~ SOLN: 4.0000 [IU] | Freq: Three times a day (TID) | SUBCUTANEOUS | Status: DC

## 2014-05-05 MED ORDER — FLUOXETINE HCL 20 MG PO CAPS: 20.0000 mg | ORAL_CAPSULE | Freq: Every day | ORAL | Status: DC

## 2014-05-05 MED ORDER — GABAPENTIN 100 MG PO CAPS: 100.0000 mg | ORAL_CAPSULE | Freq: Three times a day (TID) | ORAL | Status: DC

## 2014-05-05 MED ORDER — TRAZODONE HCL 50 MG PO TABS: 50.0000 mg | ORAL_TABLET | Freq: Every evening | ORAL | Status: DC | PRN

## 2014-05-05 MED ORDER — NICOTINE 21 MG/24HR TD PT24: 21.0000 mg | MEDICATED_PATCH | Freq: Every day | TRANSDERMAL | Status: DC

## 2014-05-05 MED ORDER — HYDROXYZINE HCL 25 MG PO TABS: 25.0000 mg | ORAL_TABLET | ORAL | Status: DC | PRN

## 2014-05-05 NOTE — Progress Notes (Signed)
Pt discharged at this time. Pt denies SI/HI. Pt provided with sample medications as well as prescriptions. Nicotine patch not removed as pt was provided with supply of nicotine patches in attempt to quit smoking. Discharge instructions provided and pt verbalized understanding, all belongings returned.

## 2014-05-05 NOTE — BHH Group Notes (Signed)
[facility] Group Notes:  (Clinical Social Work)  05/05/2014     10-11AM  Summary of Progress/Problems:   The main focus of today's process group was for the patient to identify ways in which they have in the past sabotaged their own recovery. Motivational Interviewing and a worksheet were utilized to help patients explore in depth the perceived benefits and costs of their substance use, as well as the potential benefits and costs of stopping.  The Stages of Change were explained using a handout, with an emphasis on making plans to deal with sabotaging behaviors proactively.  The patient expressed that their self-sabotaging behavior is to use drugs, alcohol and overeating to handle her feelings when she gets upset.  If she manages to change her behaviors, she states, it will mean staying out of the hospital, having better self-esteem, having less shame.  Type of Therapy:  Group Therapy - Process   Participation Level:  Active  Participation Quality:  Attentive and Sharing  Affect:  Blunted and Depressed  Cognitive:  Alert  Insight:  Developing/Improving  Engagement in Therapy:  Engaged  Modes of Intervention:  Education, Support and Processing, Motivational Interviewing  Selmer Dominion, LCSW 05/05/2014, 11:52 AM
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
## 2014-05-05 NOTE — Progress Notes (Signed)
Patient was not officially seen today as I am still waiting on her lumbar x-rays ordered on 7/9, continue tramadol for back pain as ordered currently If her x-rays are not done during this hospitalization, then recommend outpatient MRI of lumbar spine to be arranged for by her PCP, patient is currently ambulating without any weakness in her legs As long as the patient is not using cocaine, it is okay to resume outpatient antihypertensive medications Recommend BMP in one week by PCP Okay to resume Lantus at her home dose Okay with discharge on Saturday

## 2014-05-05 NOTE — BHH Group Notes (Signed)
[facility] Group Notes:  (Nursing/MHT/Case Management/Adjunct)  Date:  05/05/2014  Time:  1:54 PM  Type of Therapy:  Psychoeducational Skills  Participation Level:  Did Not Attend  Ventura Sellers 05/05/2014, 1:54 PM
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
## 2014-05-05 NOTE — [facility] Group Notes (Signed)
[facility] Group Notes:  (Nursing/MHT/Case Management/Adjunct)  Date:  05/05/2014  Time:  12:42 PM  Type of Therapy:  Psychoeducational Skills  Participation Level:  Active  Participation Quality:  Appropriate  Affect:  Appropriate  Cognitive:  Appropriate  Insight:  Appropriate  Engagement in Group:  Engaged  Modes of Intervention:  Discussion  Summary of Progress/Problems: Pt did attend self inventory group, pt reported that she was negative SI/HI, no AH/VH noted. Pt rated her depression as a 0, and her helplessness/hopelessness as a 0.     Pt reported concerns about not having samples for when she is discharged, pt advised that the doctor will be made aware.   [name] 05/05/2014, 12:42 PM

## 2014-05-09 NOTE — Progress Notes (Signed)
Patient Discharge Instructions:  Next Level Care Provider Has Access to the EMR, 05/09/14 Records provided to [facility] via [facility]/Epic access.  [name], 05/09/2014, 3:44 PM

## 2014-05-21 ENCOUNTER — Other Ambulatory Visit: Payer: Self-pay | Admitting: Family Medicine

## 2014-05-24 ENCOUNTER — Ambulatory Visit (INDEPENDENT_AMBULATORY_CARE_PROVIDER_SITE_OTHER): Payer: PRIVATE HEALTH INSURANCE | Admitting: Family Medicine

## 2014-05-24 ENCOUNTER — Encounter: Payer: Self-pay | Admitting: Family Medicine

## 2014-05-24 ENCOUNTER — Encounter (INDEPENDENT_AMBULATORY_CARE_PROVIDER_SITE_OTHER): Payer: Self-pay

## 2014-05-24 VITALS — BP 130/100 | HR 100 | Resp 20 | Ht 61.0 in | Wt 183.1 lb

## 2014-05-24 DIAGNOSIS — Z794 Long term (current) use of insulin: Principal | ICD-10-CM

## 2014-05-24 DIAGNOSIS — M79609 Pain in unspecified limb: Secondary | ICD-10-CM

## 2014-05-24 DIAGNOSIS — N6320 Unspecified lump in the left breast, unspecified quadrant: Secondary | ICD-10-CM

## 2014-05-24 DIAGNOSIS — E669 Obesity, unspecified: Secondary | ICD-10-CM

## 2014-05-24 DIAGNOSIS — E1065 Type 1 diabetes mellitus with hyperglycemia: Secondary | ICD-10-CM

## 2014-05-24 DIAGNOSIS — Z79899 Other long term (current) drug therapy: Secondary | ICD-10-CM

## 2014-05-24 DIAGNOSIS — F332 Major depressive disorder, recurrent severe without psychotic features: Secondary | ICD-10-CM

## 2014-05-24 DIAGNOSIS — E1349 Other specified diabetes mellitus with other diabetic neurological complication: Secondary | ICD-10-CM

## 2014-05-24 DIAGNOSIS — F1910 Other psychoactive substance abuse, uncomplicated: Secondary | ICD-10-CM

## 2014-05-24 DIAGNOSIS — M549 Dorsalgia, unspecified: Secondary | ICD-10-CM

## 2014-05-24 DIAGNOSIS — E1165 Type 2 diabetes mellitus with hyperglycemia: Principal | ICD-10-CM

## 2014-05-24 DIAGNOSIS — N63 Unspecified lump in unspecified breast: Secondary | ICD-10-CM

## 2014-05-24 DIAGNOSIS — IMO0002 Reserved for concepts with insufficient information to code with codable children: Secondary | ICD-10-CM

## 2014-05-24 DIAGNOSIS — M79673 Pain in unspecified foot: Secondary | ICD-10-CM

## 2014-05-24 DIAGNOSIS — I10 Essential (primary) hypertension: Secondary | ICD-10-CM

## 2014-05-24 DIAGNOSIS — IMO0001 Reserved for inherently not codable concepts without codable children: Secondary | ICD-10-CM

## 2014-05-24 MED ORDER — METOPROLOL TARTRATE 25 MG PO TABS: 12.5000 mg | ORAL_TABLET | Freq: Two times a day (BID) | ORAL | Status: DC

## 2014-05-24 MED ORDER — TRAMADOL HCL 50 MG PO TABS: 50.0000 mg | ORAL_TABLET | Freq: Two times a day (BID) | ORAL | Status: DC

## 2014-05-24 MED ORDER — GABAPENTIN 100 MG PO CAPS: 100.0000 mg | ORAL_CAPSULE | Freq: Three times a day (TID) | ORAL | Status: DC

## 2014-05-24 MED ORDER — FLUOXETINE HCL 20 MG PO CAPS: 20.0000 mg | ORAL_CAPSULE | Freq: Every day | ORAL | Status: DC

## 2014-05-24 MED ORDER — TRAZODONE HCL 50 MG PO TABS: 50.0000 mg | ORAL_TABLET | Freq: Every evening | ORAL | Status: DC | PRN

## 2014-05-24 MED ORDER — SPIRONOLACTONE 25 MG PO TABS: 25.0000 mg | ORAL_TABLET | Freq: Every day | ORAL | Status: DC

## 2014-05-24 MED ORDER — AMLODIPINE BESYLATE 10 MG PO TABS: 10.0000 mg | ORAL_TABLET | Freq: Every day | ORAL | Status: DC

## 2014-05-24 MED ORDER — CLOPIDOGREL BISULFATE 75 MG PO TABS: 75.0000 mg | ORAL_TABLET | Freq: Every day | ORAL | Status: DC

## 2014-05-24 MED ORDER — FUROSEMIDE 40 MG PO TABS: 40.0000 mg | ORAL_TABLET | Freq: Every day | ORAL | Status: DC

## 2014-05-24 MED ORDER — BENAZEPRIL HCL 40 MG PO TABS: 40.0000 mg | ORAL_TABLET | Freq: Every day | ORAL | Status: DC

## 2014-05-24 MED ORDER — PANTOPRAZOLE SODIUM 40 MG PO TBEC: 40.0000 mg | DELAYED_RELEASE_TABLET | Freq: Every day | ORAL | Status: DC

## 2014-05-24 MED ORDER — ROSUVASTATIN CALCIUM 40 MG PO TABS: 40.0000 mg | ORAL_TABLET | Freq: Every day | ORAL | Status: DC

## 2014-05-24 MED ORDER — INSULIN ASPART 100 UNIT/ML FLEXPEN: 4.0000 [IU] | PEN_INJECTOR | Freq: Three times a day (TID) | SUBCUTANEOUS | Status: DC
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
## 2014-05-24 NOTE — Patient Instructions (Addendum)
F/u in 3 month, call if you need me before  Fasting lipid and cmp and EGFR next 1 to 2 weeks  Blood sugar is much better, keep it up  Make and keep appt with Dr Dorris Fetch  Info will be given re smoking cessation classes in [place]    BP is high, start additional med spironolactone 51m one daily, stop potassium  You are referred for unilateral left diag mammogram

## 2014-05-25 ENCOUNTER — Other Ambulatory Visit: Payer: Self-pay | Admitting: Family Medicine

## 2014-05-25 LAB — MICROALBUMIN / CREATININE URINE RATIO
Creatinine, Urine: 59.5 mg/dL
Microalb Creat Ratio: 723.9 mg/g — ABNORMAL HIGH (ref 0.0–30.0)
Microalb, Ur: 43.07 mg/dL — ABNORMAL HIGH (ref 0.00–1.89)

## 2014-05-28 DIAGNOSIS — F1910 Other psychoactive substance abuse, uncomplicated: Secondary | ICD-10-CM | POA: Insufficient documentation

## 2014-05-28 NOTE — Assessment & Plan Note (Signed)
Uncontrolled, add spironolactone DASH diet and commitment to daily physical activity for a minimum of 30 minutes discussed and encouraged, as a part of hypertension management. The importance of attaining a healthy weight is also discussed.

## 2014-05-28 NOTE — Assessment & Plan Note (Signed)
Uncontrolled, resume tramadol twice daily and gabapentin as before

## 2014-05-28 NOTE — Assessment & Plan Note (Signed)
Hospitalized earlier this month, voluntarily commited herself , already , she has failed to establish in out pt drug rehab, which she should have done, an unfortunate situation

## 2014-05-28 NOTE — Assessment & Plan Note (Signed)
Improved Patient advised to reduce carb and sweets, commit to regular physical activity, take meds as prescribed, test blood as directed, and attempt to lose weight, to improve blood sugar control. Pt to [facility] and keep appt with endo treating her

## 2014-05-28 NOTE — Assessment & Plan Note (Signed)
Diagnostic mammogram due , order entered

## 2014-05-28 NOTE — Assessment & Plan Note (Signed)
Pt recently comited herself for inpatient treatment of depression associated with polysubstance abuse. She states she was unable to get access to an outpt rehab facility, which is what she would have liked to have done, now living in [place] with her daughter and wants to return to [place], no safe living environment here  I have advised her to work with her social worker too identify suitable housing, I have tried in the past to asssist with no success, pt would do best in an in home safe dwelling situation, she has resisted this repeatedly in the past

## 2014-05-31 ENCOUNTER — Encounter (HOSPITAL_COMMUNITY): Payer: Self-pay

## 2014-05-31 IMAGING — CR DG CHEST 1V PORT
1 series · 1 of 1 positions shown · non-contrast
Comparison: Portable exam 5155 hours compared to 04/08/2011

CLINICAL DATA: Shortness of breath, cough, hypertension, diabetes

PORTABLE CHEST - 1 VIEW

[view not recorded]
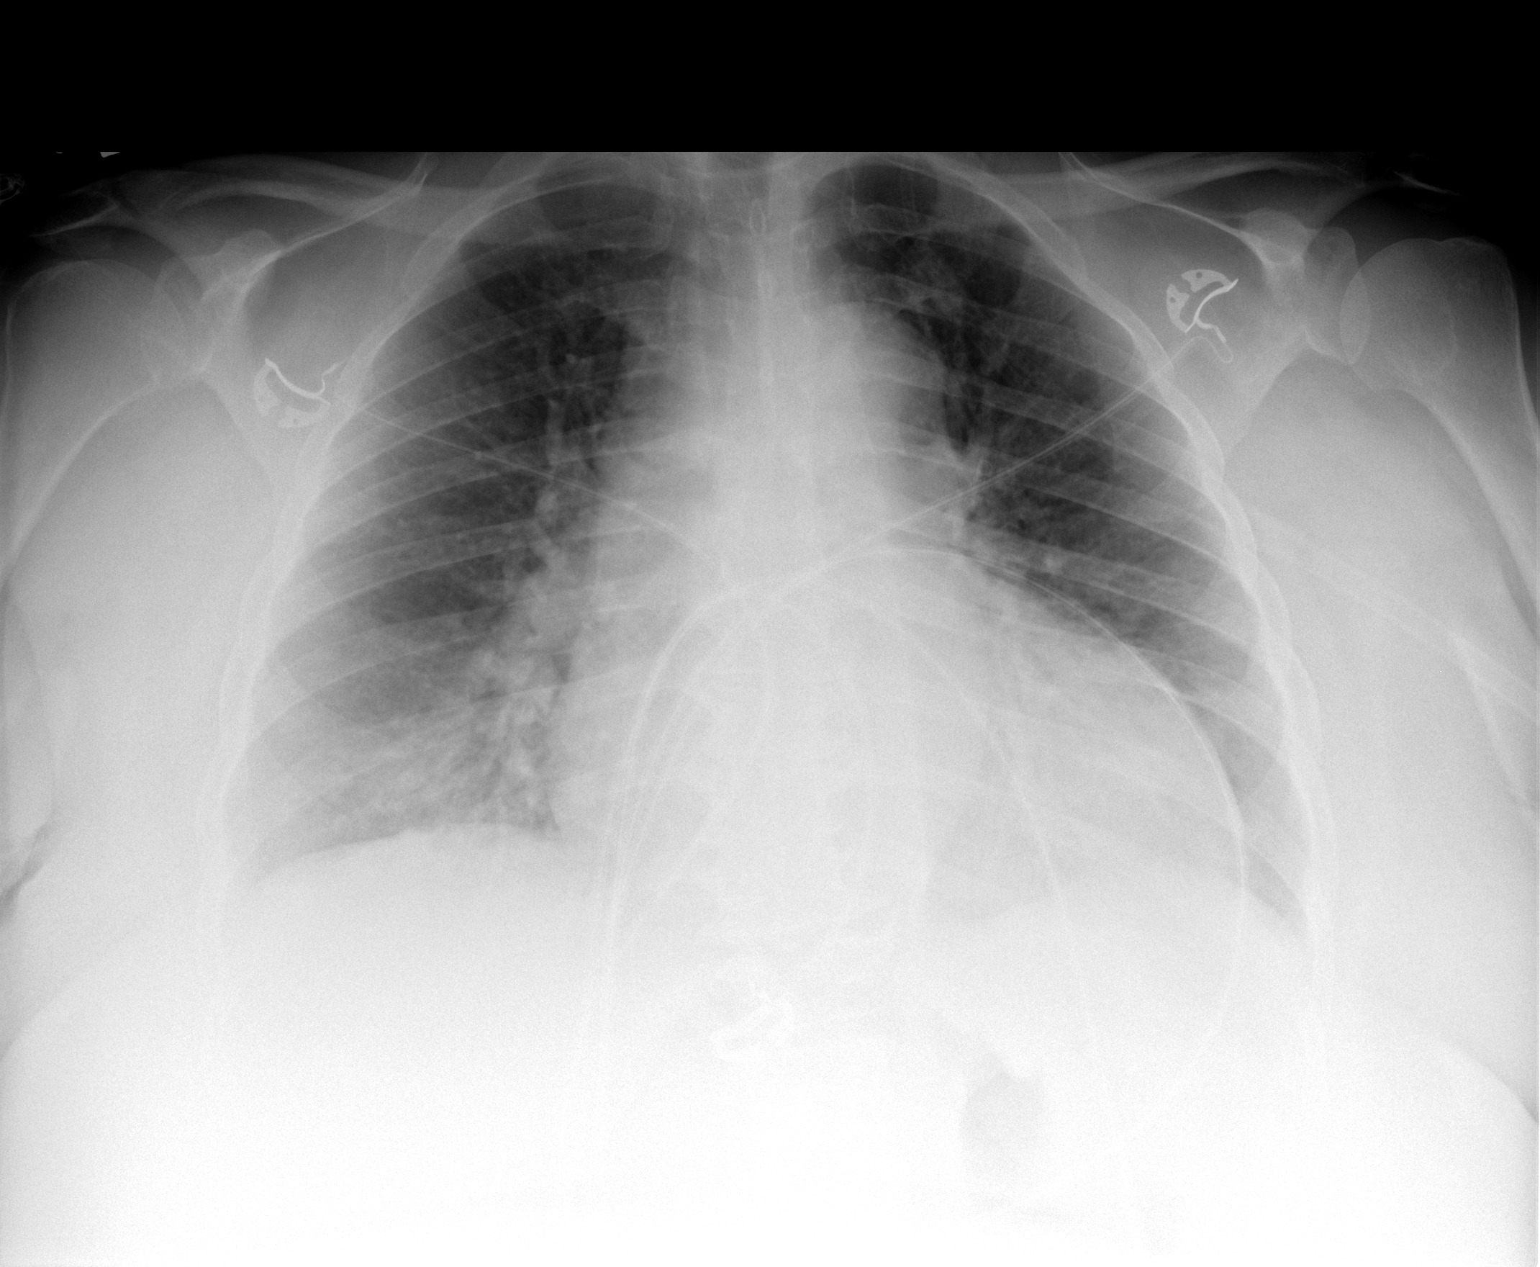

[1 of 1 positions shown; findings below may reference images not displayed]

FINDINGS: Enlargement of cardiac silhouette with pulmonary vascular
congestion.
Tortuous aorta.
Mild atelectasis versus early infiltrate at right lung base.
Remaining lungs grossly clear.
No pleural effusion or pneumothorax.
Bones unremarkable.
IMPRESSION: Enlargement of cardiac silhouette with pulmonary vascular
congestion.
Mild atelectasis versus early infiltrate at right lung base.

## 2014-06-01 IMAGING — CT CT CHEST W/ CM
2 of 3 series · 15 of 36 positions shown, 18 images · IV contrast (Omnipaque 300)
Comparison: None.

CLINICAL DATA: Shortness of breath for 5 days.  Congestion and
tightness in the chest.

CT CHEST WITH CONTRAST
TECHNIQUE: Multidetector CT imaging of the chest was performed
following the standard protocol during bolus administration of
intravenous contrast.
Contrast: 100mL OMNIPAQUE IOHEXOL 300 MG/ML  SOLN

[Series 2: chestroutine 5.0 b40f · axial · 0.68mm/px · z∈[-319,-39]mm · 12 of 66 slices shown, 15 images]
[im 5/66  mediastinal]
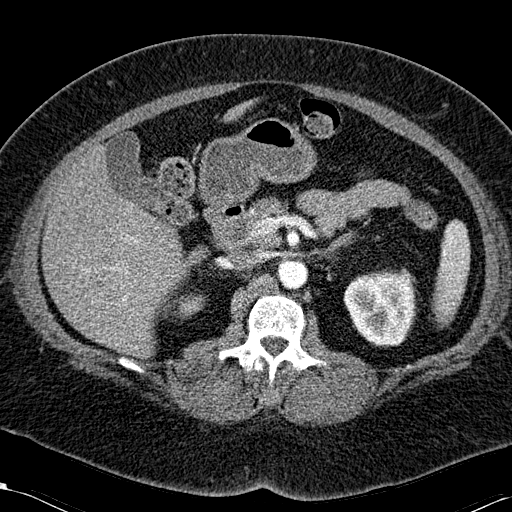
[im 5/66  lung]
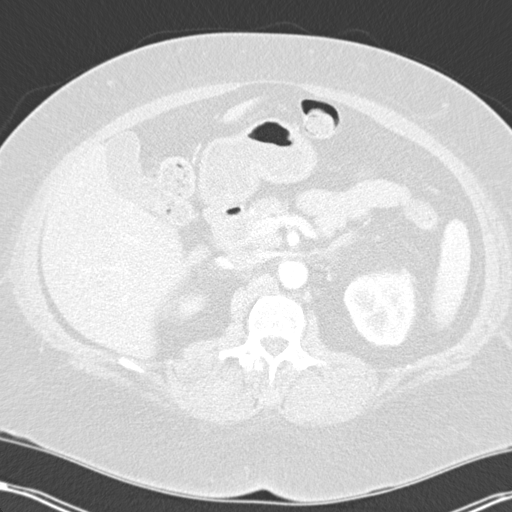
[im 10/66  lung]
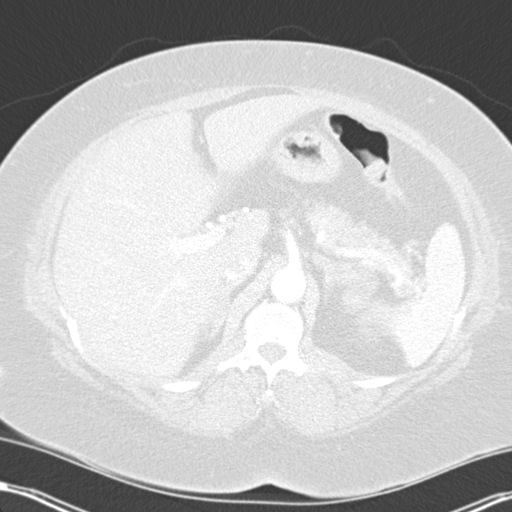
[im 15/66  lung]
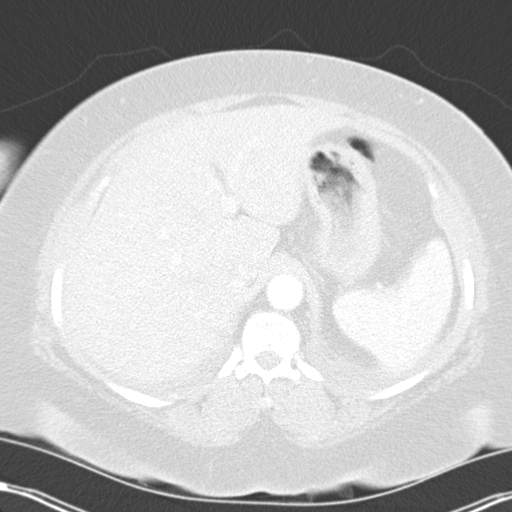
[im 20/66  lung]
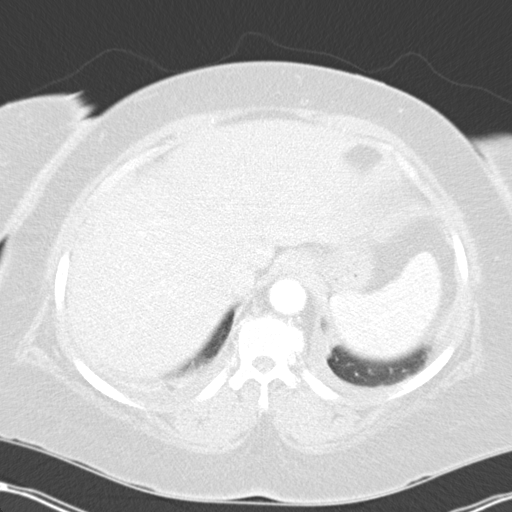
[im 25/66  mediastinal]
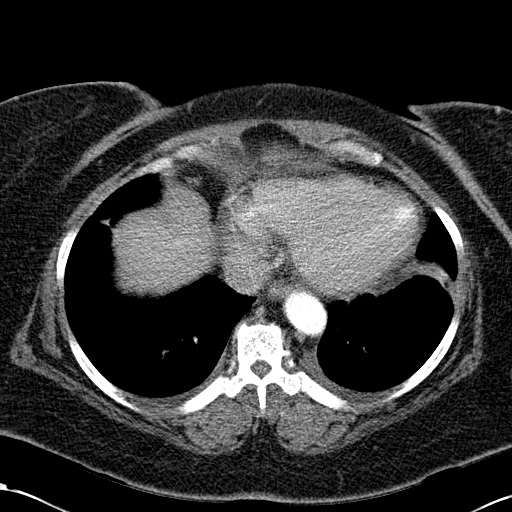
[im 25/66  lung]
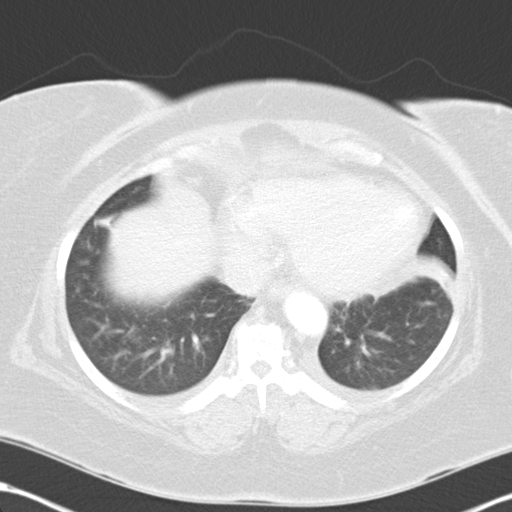
[im 29/66  lung]
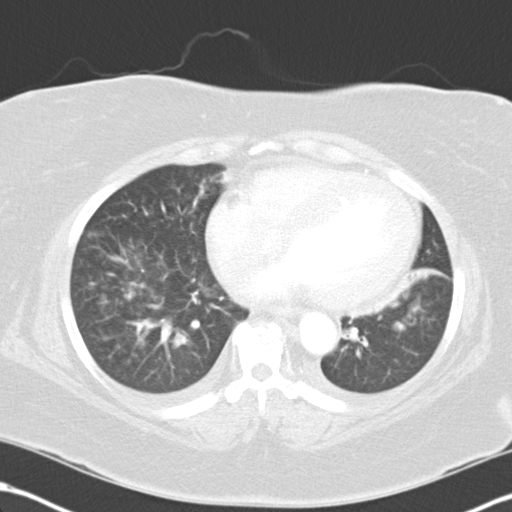
[im 37/66  lung]
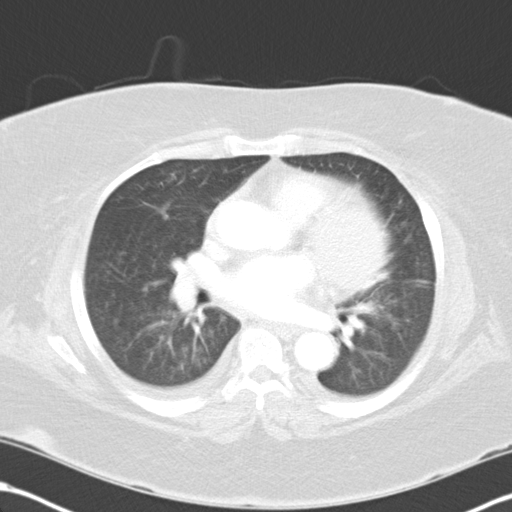
[im 41/66  lung]
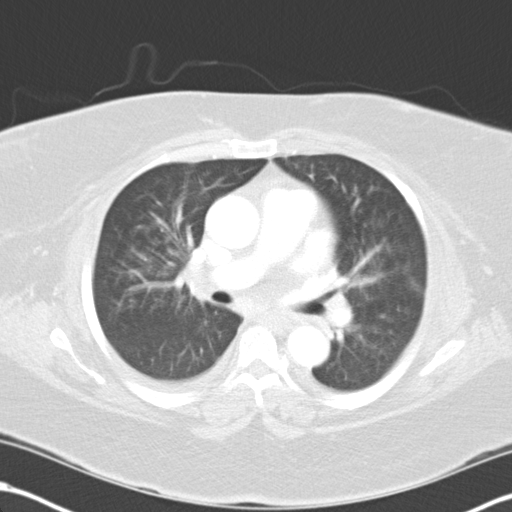
[im 46/66  mediastinal]
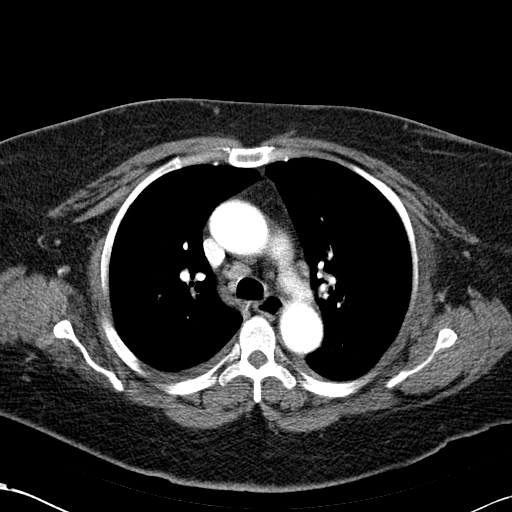
[im 46/66  lung]
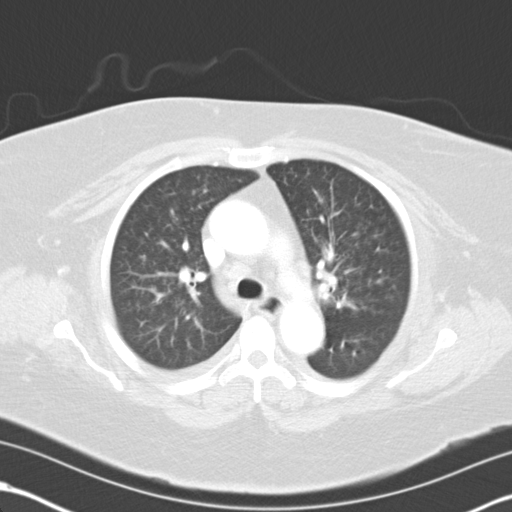
[im 51/66  lung]
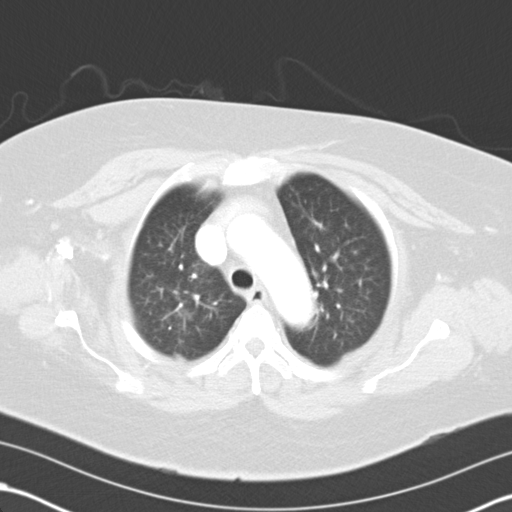
[im 56/66  lung]
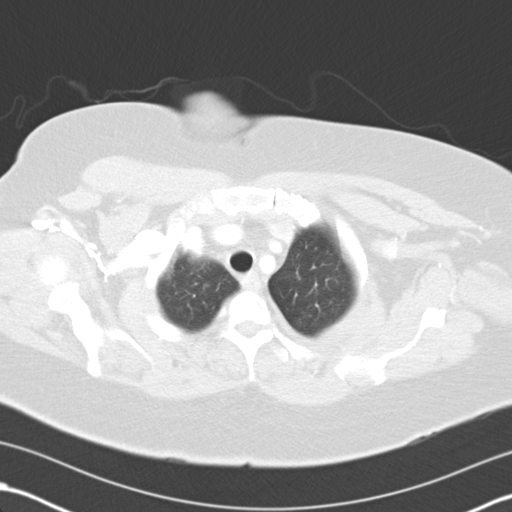
[im 61/66  lung]
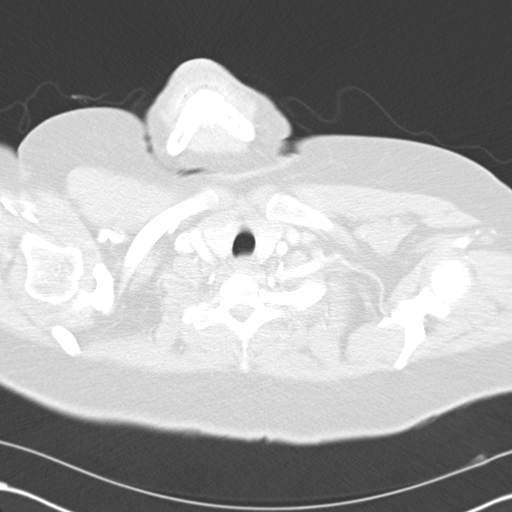

[Series 6: mpr coronal chest 3mm · coronal · 0.62mm/px · 3 of 89 slices shown]
[im 18/89  lung]
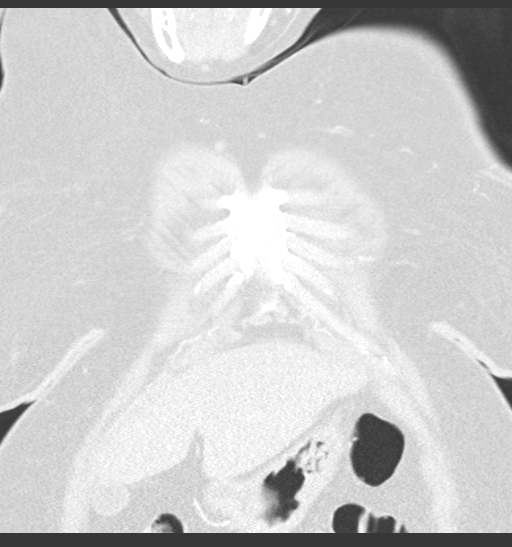
[im 36/89  lung]
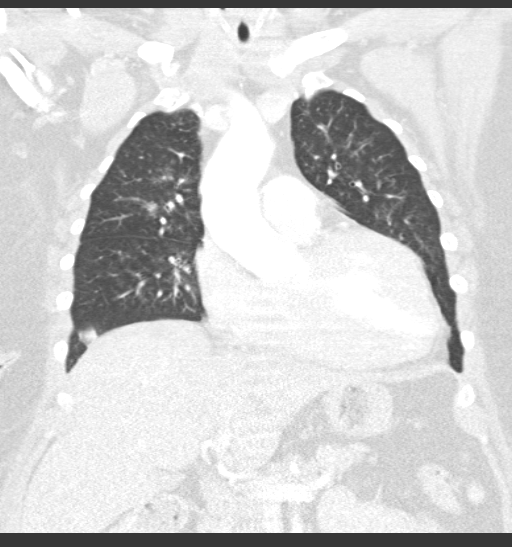
[im 53/89  lung]
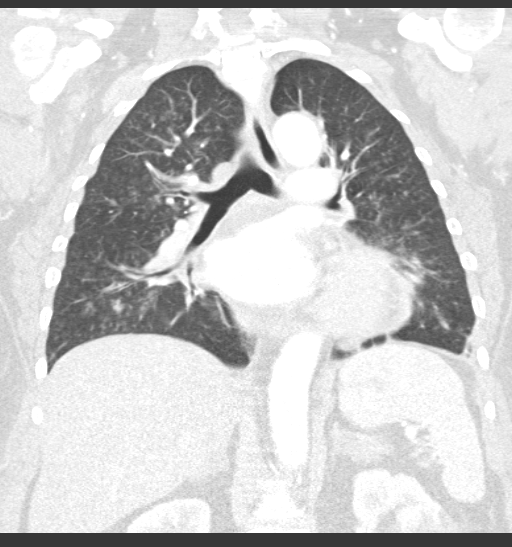

[15 of 36 positions shown; findings below may reference images not displayed]

FINDINGS: Normal heart size.  Normal caliber thoracic aorta.
Visualized central pulmonary arteries appear patent.  Right
pretracheal lymph nodes are upper limits of normal size, measuring
about 10 mm short axis dimension.  Likely this is reactive.  The
esophagus is decompressed.  Small bilateral pleural effusions.

Visualization of the lung parenchyma is limited due to respiratory
motion artifact.  Suggestion of fine nodular parenchymal
infiltration in the perihilar regions bilaterally.  This suggests
edema versus pneumonia.  Mild atelectasis in the lung bases.  No
pneumothorax.  Airways appear patent.  Degenerative changes in the
thoracic spine.  No destructive bone lesions appreciated.
IMPRESSION: There appears to be patchy nodular perihilar infiltration in both
lungs which may represent mild edema or early pneumonia.  Small
bilateral pleural effusions.

## 2014-06-01 IMAGING — CR DG CHEST 1V PORT
1 series · 1 of 1 positions shown · non-contrast
Comparison: 11/07/2012

CLINICAL DATA: Shortness of breath, history of pneumonia CHF

PORTABLE CHEST - 1 VIEW

[view not recorded]
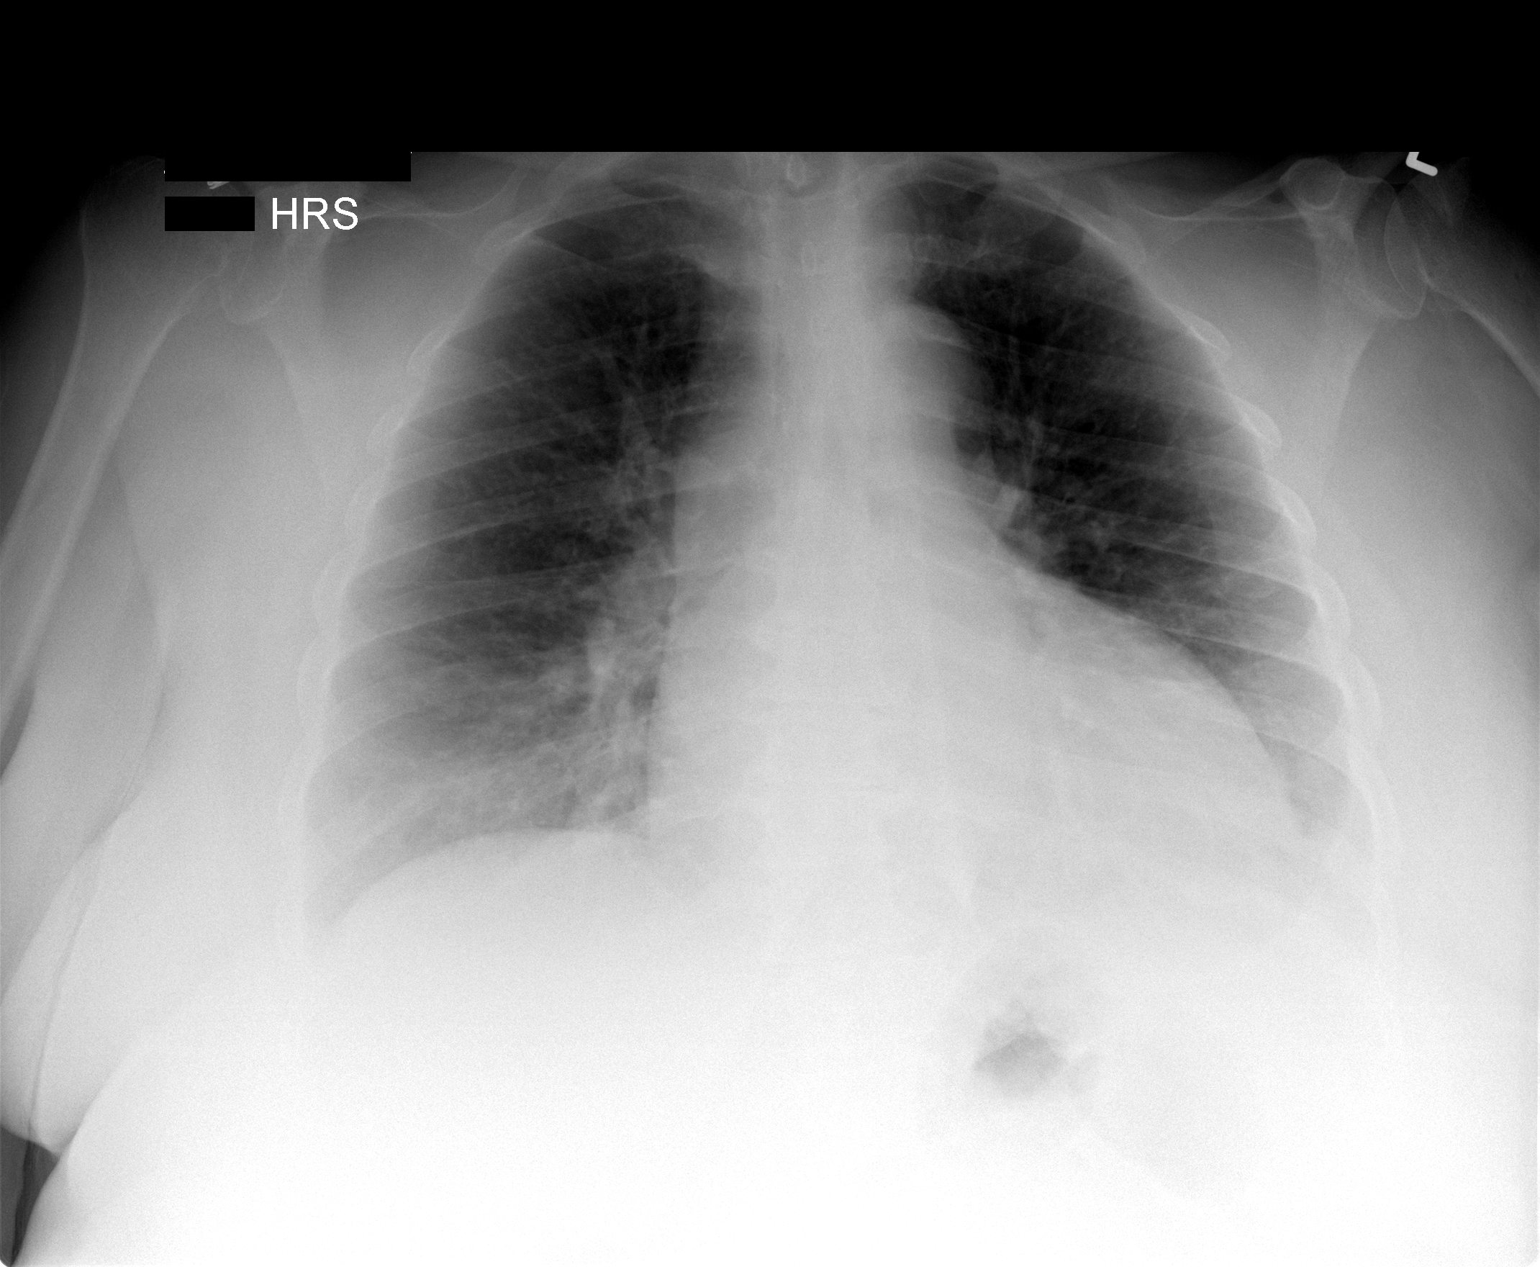

[1 of 1 positions shown; findings below may reference images not displayed]

FINDINGS: Stable cardiomegaly with vascular congestion as before.
Minimal basilar atelectasis.  No significant change.  No effusion
or pneumothorax.  Trachea midline.  Degenerative changes of the
spine.
IMPRESSION: Cardiomegaly with vascular congestion, stable

## 2014-06-04 IMAGING — CR DG CHEST 2V
2 series · 2 of 2 positions shown · non-contrast
Comparison: 11/08/2012

CLINICAL DATA: Shortness of breath, hypertension, diabetes, follow
up pneumonia

CHEST - 2 VIEW

[view not recorded (1 of 2)]
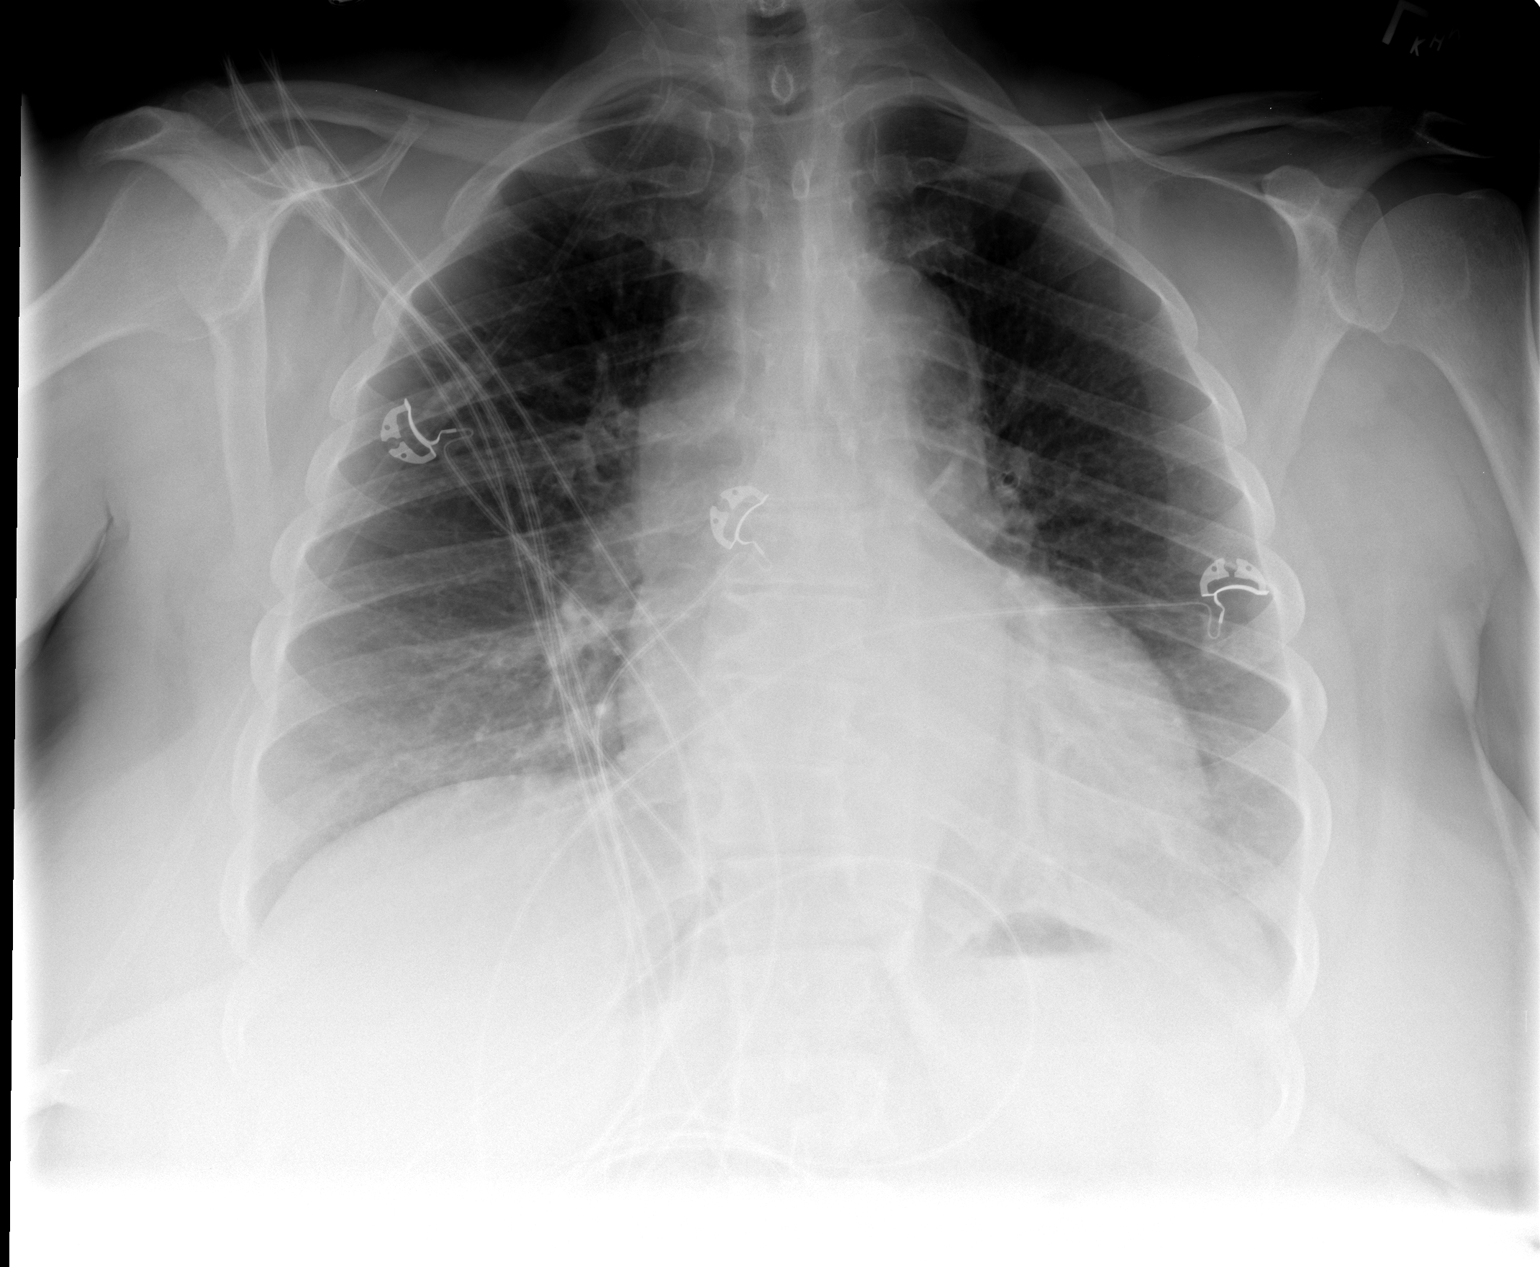

[view not recorded (2 of 2)]
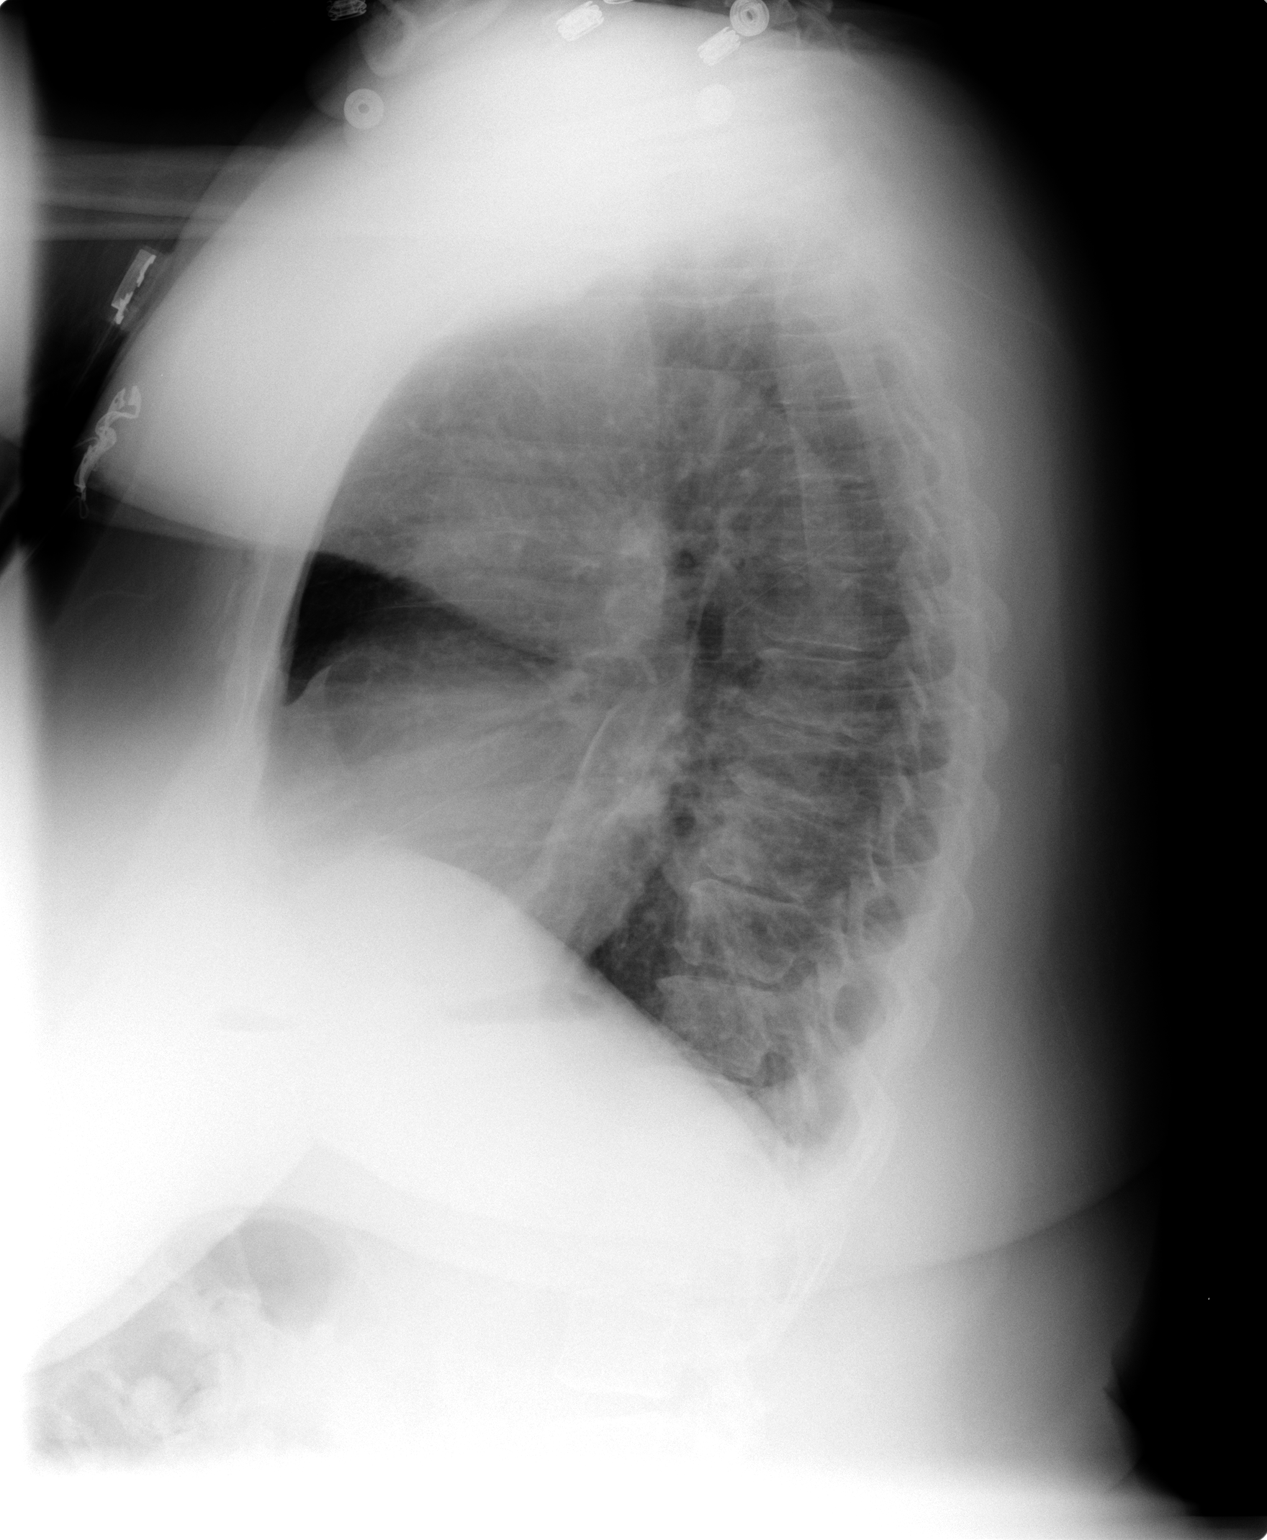

[2 of 2 positions shown; findings below may reference images not displayed]

FINDINGS: Enlargement of cardiac silhouette.
Tortuous thoracic aorta.
Pulmonary vascularity normal.
Bronchitic changes with minimal atelectasis at right base.
No definite infiltrate, pleural effusion or pneumothorax.
Mild endplate spur formation lower thoracic spine.
IMPRESSION: Enlargement of cardiac silhouette.
Bronchitic changes with minimal right basilar atelectasis.

## 2014-06-05 ENCOUNTER — Encounter (HOSPITAL_COMMUNITY): Payer: Self-pay

## 2014-06-07 ENCOUNTER — Encounter: Payer: PRIVATE HEALTH INSURANCE | Admitting: Family Medicine

## 2014-06-12 ENCOUNTER — Encounter (HOSPITAL_COMMUNITY): Payer: Self-pay

## 2014-06-26 ENCOUNTER — Encounter (HOSPITAL_COMMUNITY): Payer: Self-pay

## 2014-07-09 ENCOUNTER — Other Ambulatory Visit: Payer: Self-pay | Admitting: Family Medicine

## 2014-07-10 ENCOUNTER — Telehealth: Payer: Self-pay | Admitting: Family Medicine

## 2014-07-10 DIAGNOSIS — Z1231 Encounter for screening mammogram for malignant neoplasm of breast: Secondary | ICD-10-CM

## 2014-07-10 NOTE — Telephone Encounter (Signed)
Pt had unilateral diagnostic mammogram ordered since July, not done, has h/o breast abcess, which  was treated by Dr Arnoldo Morale Her regular mammogram is PAST due please order and get let her know it is very important she has her mammogram which was due in August, I will sign

## 2014-07-10 NOTE — Assessment & Plan Note (Signed)
Chronic foot pain with charcots disease, gabapentin recommended

## 2014-07-10 NOTE — Assessment & Plan Note (Addendum)
C/o uncontrolled pain needs treatment thropugh pain management due to multiple mental health issues and polysubstance drug abuse

## 2014-07-10 NOTE — Assessment & Plan Note (Signed)
Unchanged. Patient re-educated about  the importance of commitment to a  minimum of 150 minutes of exercise per week. The importance of healthy food choices with portion control discussed. Encouraged to start a food diary, count calories and to consider  joining a support group. Sample diet sheets offered. Goals set by the patient for the next several months.    

## 2014-07-12 NOTE — Addendum Note (Signed)
Addended by: Denman George B on: 07/12/2014 10:43 AM   Modules accepted: Orders

## 2014-07-12 NOTE — Telephone Encounter (Signed)
Patient is aware and states that she understands that she needs to have screening mammogram.  She would like for this to be scheduled for early in the am if possible.  She has a new # [id number]

## 2014-07-12 NOTE — Telephone Encounter (Signed)
Order entered

## 2014-07-16 ENCOUNTER — Other Ambulatory Visit: Payer: Self-pay | Admitting: Family Medicine

## 2014-07-16 ENCOUNTER — Other Ambulatory Visit: Payer: Self-pay

## 2014-07-16 MED ORDER — IBUPROFEN 800 MG PO TABS: ORAL_TABLET | ORAL | Status: DC

## 2014-07-31 ENCOUNTER — Ambulatory Visit (HOSPITAL_COMMUNITY)
Admission: RE | Admit: 2014-07-31 | Discharge: 2014-07-31 | Disposition: A | Discharge status: Home / Self Care | Payer: PRIVATE HEALTH INSURANCE | Source: Ambulatory Visit | Attending: Family Medicine | Admitting: Family Medicine

## 2014-07-31 DIAGNOSIS — Z09 Encounter for follow-up examination after completed treatment for conditions other than malignant neoplasm: Secondary | ICD-10-CM | POA: Diagnosis not present

## 2014-07-31 DIAGNOSIS — N6320 Unspecified lump in the left breast, unspecified quadrant: Secondary | ICD-10-CM

## 2014-07-31 DIAGNOSIS — N61 Inflammatory disorders of breast: Secondary | ICD-10-CM | POA: Insufficient documentation

## 2014-08-13 ENCOUNTER — Telehealth: Payer: Self-pay

## 2014-08-13 ENCOUNTER — Ambulatory Visit: Payer: PRIVATE HEALTH INSURANCE | Admitting: Family Medicine

## 2014-08-13 ENCOUNTER — Other Ambulatory Visit: Payer: Self-pay | Admitting: Family Medicine

## 2014-08-13 NOTE — Telephone Encounter (Signed)
Medication requested refilled.

## 2014-08-22 ENCOUNTER — Telehealth: Payer: Self-pay | Admitting: *Deleted

## 2014-08-22 ENCOUNTER — Other Ambulatory Visit: Payer: Self-pay

## 2014-08-22 DIAGNOSIS — I10 Essential (primary) hypertension: Secondary | ICD-10-CM

## 2014-08-22 NOTE — Telephone Encounter (Signed)
Pt called stating she will be using [facility]

## 2014-08-22 NOTE — Telephone Encounter (Signed)
Pt called requesting to speak with a nurse please advise [phone]

## 2014-08-22 NOTE — Telephone Encounter (Signed)
Called patient and left message for them to return call at the office   

## 2014-08-22 NOTE — Telephone Encounter (Signed)
See previous message, called back and spoke with her

## 2014-08-22 NOTE — Telephone Encounter (Signed)
Patient aware that her incontinence supplies will be faxed once the Dr signs

## 2014-09-03 ENCOUNTER — Other Ambulatory Visit: Payer: Self-pay

## 2014-09-03 ENCOUNTER — Ambulatory Visit: Payer: PRIVATE HEALTH INSURANCE | Admitting: Family Medicine

## 2014-09-05 ENCOUNTER — Other Ambulatory Visit: Payer: Self-pay | Admitting: Family Medicine

## 2014-09-07 ENCOUNTER — Telehealth: Payer: Self-pay

## 2014-09-07 MED ORDER — GABAPENTIN 300 MG PO CAPS: 300.0000 mg | ORAL_CAPSULE | Freq: Every day | ORAL | Status: DC

## 2014-09-07 MED ORDER — ACETAMINOPHEN 500 MG PO TABS: 500.0000 mg | ORAL_TABLET | Freq: Every day | ORAL | Status: DC

## 2014-09-07 NOTE — Addendum Note (Signed)
Addended by: Denman George B on: 09/07/2014 01:00 PM   Modules accepted: Orders

## 2014-09-07 NOTE — Telephone Encounter (Signed)
I advise gabapentin 300 mg one at bedtime and tylenol 500 mg one daily as being the safer option for her chronic pain management , pls erx in both ,she has not been in for a while , I do not know if she is taking the gabpentin 100 mg on the current list, if sahe is I still do recommend the 300 mg tab at bedtime Medication mx will be fully addressed when she next comes to office with all meds she is currently taking, pls explain

## 2014-09-07 NOTE — Telephone Encounter (Signed)
meds sent to requested pharmacy.  Patient has an appointment in the system.

## 2014-09-10 ENCOUNTER — Ambulatory Visit (INDEPENDENT_AMBULATORY_CARE_PROVIDER_SITE_OTHER): Payer: PRIVATE HEALTH INSURANCE

## 2014-09-10 ENCOUNTER — Encounter: Payer: Self-pay | Admitting: Family Medicine

## 2014-09-10 ENCOUNTER — Ambulatory Visit: Payer: Self-pay | Admitting: Family Medicine

## 2014-09-10 ENCOUNTER — Ambulatory Visit (INDEPENDENT_AMBULATORY_CARE_PROVIDER_SITE_OTHER): Payer: PRIVATE HEALTH INSURANCE | Admitting: Family Medicine

## 2014-09-10 ENCOUNTER — Encounter (INDEPENDENT_AMBULATORY_CARE_PROVIDER_SITE_OTHER): Payer: Self-pay

## 2014-09-10 VITALS — BP 98/72 | HR 77 | Resp 18 | Ht 61.0 in | Wt 170.1 lb

## 2014-09-10 DIAGNOSIS — F332 Major depressive disorder, recurrent severe without psychotic features: Secondary | ICD-10-CM

## 2014-09-10 DIAGNOSIS — G8929 Other chronic pain: Secondary | ICD-10-CM | POA: Diagnosis not present

## 2014-09-10 DIAGNOSIS — F17210 Nicotine dependence, cigarettes, uncomplicated: Secondary | ICD-10-CM

## 2014-09-10 DIAGNOSIS — Z794 Long term (current) use of insulin: Secondary | ICD-10-CM

## 2014-09-10 DIAGNOSIS — E1065 Type 1 diabetes mellitus with hyperglycemia: Secondary | ICD-10-CM | POA: Diagnosis not present

## 2014-09-10 DIAGNOSIS — F17219 Nicotine dependence, cigarettes, with unspecified nicotine-induced disorders: Secondary | ICD-10-CM

## 2014-09-10 DIAGNOSIS — E1165 Type 2 diabetes mellitus with hyperglycemia: Secondary | ICD-10-CM

## 2014-09-10 DIAGNOSIS — G894 Chronic pain syndrome: Secondary | ICD-10-CM

## 2014-09-10 DIAGNOSIS — E559 Vitamin D deficiency, unspecified: Secondary | ICD-10-CM | POA: Diagnosis not present

## 2014-09-10 DIAGNOSIS — Z23 Encounter for immunization: Secondary | ICD-10-CM

## 2014-09-10 DIAGNOSIS — I10 Essential (primary) hypertension: Secondary | ICD-10-CM

## 2014-09-10 DIAGNOSIS — IMO0001 Reserved for inherently not codable concepts without codable children: Secondary | ICD-10-CM

## 2014-09-10 DIAGNOSIS — F1910 Other psychoactive substance abuse, uncomplicated: Secondary | ICD-10-CM

## 2014-09-10 LAB — GLUCOSE, POCT (MANUAL RESULT ENTRY): POC Glucose: 110 mg/dl — AB (ref 70–99)

## 2014-09-10 MED ORDER — GABAPENTIN 300 MG PO CAPS: 300.0000 mg | ORAL_CAPSULE | Freq: Two times a day (BID) | ORAL | Status: DC

## 2014-09-10 NOTE — Patient Instructions (Addendum)
Annal wellness in 3 month  Flu vaccine today and foot exam  Stop gabapentin 148m, new is gabapentin 3089mtwice daily  You are referred to dr OtLyla Sonor chronic pain management  Stop spironolactone, blood pressure is too low Labs today, cmp and EGFR, hBA1C and lipid and CBC and TSH and vit D  Letter of neccesity for your dog will be prepared on health grouunds

## 2014-09-10 NOTE — Progress Notes (Signed)
Subjective:    Patient ID: [name], female    DOB: [birth year], 48 y.o.   MRN: [id number]  HPI The PT is here for follow up and re-evaluation of chronic medical conditions, medication management and review of any available recent lab and radiology data.  Preventive health is updated, specifically  Cancer screening and Immunization.   Questions or concerns regarding consultations or procedures which the PT has had in the interim are  addressed. The PT denies any adverse reactions to current medications since the last visit.  There are no new concerns. =c/o increased and generalized pain involving spine and major joints , wants additional help Denies polyuria, polydipsia, blurred vision , or hypoglycemic episodes. Requests a letter be written for her to have her dog live with her on mental health grounds      Review of Systems See HPI Denies recent fever or chills. Denies sinus pressure, nasal congestion, ear pain or sore throat. Denies chest congestion, productive cough or wheezing. Denies chest pains, palpitations and leg swelling Denies abdominal pain, nausea, vomiting,diarrhea or constipation.   Denies dysuria, frequency, hesitancy or incontinence. Denies headaches, seizures, numbness, or tingling. C/o  depression, anxiety or insomnia. Denies skin break down or rash.        Objective:   Physical Exam BP 98/72 mmHg  Pulse 77  Resp 18  Ht 5\' 1"  (1.549 m)  Wt 170 lb 1.9 oz (77.166 kg)  BMI 32.16 kg/m2  SpO2 98% Patient alert and oriented and in no cardiopulmonary distress.  HEENT: No facial asymmetry, EOMI,   oropharynx pink and moist.  Neck supple no JVD, no mass.  Chest: Clear to auscultation bilaterally.  CVS: S1, S2 no murmurs, no S3.Regular rate.  ABD: Soft non tender.   Ext: No edema  MS: Adequate though rediuced  ROM spine, shoulders, hips and knees.  Skin: Intact, no ulcerations or rash noted.  Psych: Good eye contact, normal affect. Memory  intact, not anxious , at times tearful and  depressed appearing.  CNS: CN 2-12 intact, power,  normal throughout.no focal deficits noted.        Assessment & Plan:  Essential hypertension Over corrected, discontinue spironolactone   Diabetes mellitus, insulin dependent (IDDM), uncontrolled Patient educated about the importance of limiting  Carbohydrate intake , the need to commit to daily physical activity for a minimum of 30 minutes , and to commit weight loss. The fact that changes in all these areas will reduce or eliminate all together the development of diabetes is stressed.   Diabetic Labs Latest Ref Rng 01/22/2015 10/01/2014 09/13/2014 09/12/2014 09/11/2014  HbA1c <5.7 % 7.0(H) - - - -  Microalbumin 0.00 - 1.89 mg/dL - - - - -  Micro/Creat Ratio 0.0 - 30.0 mg/g - - - - -  Chol 0 - 200 mg/dL 236(H) - - - -  HDL >=46 mg/dL 41(L) - - - -  Calc LDL 0 - 99 mg/dL 144(H) - - - -  Triglycerides <150 mg/dL 255(H) - - - -  Creatinine 0.50 - 1.10 mg/dL 1.15(H) 1.3(H) 1.91(H) 2.44(H) 2.78(H)  GFR >60.00 mL/min - 54.70(L) - - -   BP/Weight 01/22/2015 10/29/2014 10/01/2014 09/13/2014 09/11/2014 09/10/2014 7/56/4332  Systolic BP 951 884 166 063 - 98 016  Diastolic BP 010 932 92 85 - 72 100  Wt. (Lbs) 192 184.4 182.8 - 177.03 170.12 183.12  BMI 36.3 34.86 34.56 - 33.47 32.16 34.62  Some encounter information is confidential and restricted. Go to Review  Flowsheets activity to see all data.   Foot/eye exam completion dates 10/01/2014 09/10/2014  Foot exam Order - -  Foot Form Completion Done Done  Followed by endo in [place]      Severe recurrent major depression without psychotic features Requests a letter of neccessity be written so that she may keep her dog in her home, on mental health grounds I believe that she does qualify and will write a letter on her behalf   Chronic pain syndrome Chronic uncontrolled generalized pain, involving back , bilateral footr pain  Due to charcot's  disease, increase gabapentin dose and refer to pain management    Cigarette nicotine dependence with nicotine-induced disorder Unchanged Patient counseled for approximately 5 minutes regarding the health risks of ongoing nicotine use, specifically all types of cancer, heart disease, stroke and respiratory failure. The options available for help with cessation ,the behavioral changes to assist the process, and the option to either gradully reduce usage  Or abruptly stop.is also discussed. Pt is also encouraged to set specific goals in number of cigarettes used daily, as well as to set a quit date.  Number of cigarettes/cigars currently smoking daily: 15    Need for prophylactic vaccination and inoculation against influenza After obtaining informed consent, the vaccine is  administered by LPN.

## 2014-09-11 ENCOUNTER — Inpatient Hospital Stay (HOSPITAL_COMMUNITY): Payer: PRIVATE HEALTH INSURANCE

## 2014-09-11 ENCOUNTER — Emergency Department (HOSPITAL_COMMUNITY): Payer: PRIVATE HEALTH INSURANCE

## 2014-09-11 ENCOUNTER — Inpatient Hospital Stay (HOSPITAL_COMMUNITY)
Admission: EM | Admit: 2014-09-11 | Discharge: 2014-09-13 | DRG: 683 | Disposition: A | Discharge status: Home Health | Payer: PRIVATE HEALTH INSURANCE | Attending: Internal Medicine | Admitting: Internal Medicine

## 2014-09-11 ENCOUNTER — Encounter (HOSPITAL_COMMUNITY): Payer: Self-pay

## 2014-09-11 DIAGNOSIS — Z8249 Family history of ischemic heart disease and other diseases of the circulatory system: Secondary | ICD-10-CM

## 2014-09-11 DIAGNOSIS — R531 Weakness: Secondary | ICD-10-CM | POA: Diagnosis not present

## 2014-09-11 DIAGNOSIS — Z7902 Long term (current) use of antithrombotics/antiplatelets: Secondary | ICD-10-CM

## 2014-09-11 DIAGNOSIS — N183 Chronic kidney disease, stage 3 unspecified: Secondary | ICD-10-CM | POA: Diagnosis present

## 2014-09-11 DIAGNOSIS — Z79899 Other long term (current) drug therapy: Secondary | ICD-10-CM

## 2014-09-11 DIAGNOSIS — E875 Hyperkalemia: Secondary | ICD-10-CM

## 2014-09-11 DIAGNOSIS — K649 Unspecified hemorrhoids: Secondary | ICD-10-CM | POA: Diagnosis present

## 2014-09-11 DIAGNOSIS — E1165 Type 2 diabetes mellitus with hyperglycemia: Secondary | ICD-10-CM | POA: Diagnosis present

## 2014-09-11 DIAGNOSIS — E780 Pure hypercholesterolemia: Secondary | ICD-10-CM | POA: Diagnosis present

## 2014-09-11 DIAGNOSIS — D649 Anemia, unspecified: Secondary | ICD-10-CM | POA: Diagnosis present

## 2014-09-11 DIAGNOSIS — F1910 Other psychoactive substance abuse, uncomplicated: Secondary | ICD-10-CM | POA: Diagnosis present

## 2014-09-11 DIAGNOSIS — Z825 Family history of asthma and other chronic lower respiratory diseases: Secondary | ICD-10-CM | POA: Diagnosis not present

## 2014-09-11 DIAGNOSIS — L97509 Non-pressure chronic ulcer of other part of unspecified foot with unspecified severity: Secondary | ICD-10-CM

## 2014-09-11 DIAGNOSIS — D696 Thrombocytopenia, unspecified: Secondary | ICD-10-CM | POA: Diagnosis present

## 2014-09-11 DIAGNOSIS — R0600 Dyspnea, unspecified: Secondary | ICD-10-CM

## 2014-09-11 DIAGNOSIS — Z794 Long term (current) use of insulin: Secondary | ICD-10-CM

## 2014-09-11 DIAGNOSIS — Z791 Long term (current) use of non-steroidal anti-inflammatories (NSAID): Secondary | ICD-10-CM

## 2014-09-11 DIAGNOSIS — E860 Dehydration: Secondary | ICD-10-CM | POA: Diagnosis present

## 2014-09-11 DIAGNOSIS — Z833 Family history of diabetes mellitus: Secondary | ICD-10-CM | POA: Diagnosis not present

## 2014-09-11 DIAGNOSIS — E1152 Type 2 diabetes mellitus with diabetic peripheral angiopathy with gangrene: Secondary | ICD-10-CM

## 2014-09-11 DIAGNOSIS — N189 Chronic kidney disease, unspecified: Secondary | ICD-10-CM

## 2014-09-11 DIAGNOSIS — I129 Hypertensive chronic kidney disease with stage 1 through stage 4 chronic kidney disease, or unspecified chronic kidney disease: Secondary | ICD-10-CM | POA: Diagnosis present

## 2014-09-11 DIAGNOSIS — F17210 Nicotine dependence, cigarettes, uncomplicated: Secondary | ICD-10-CM | POA: Diagnosis present

## 2014-09-11 DIAGNOSIS — I10 Essential (primary) hypertension: Secondary | ICD-10-CM | POA: Diagnosis present

## 2014-09-11 DIAGNOSIS — F410 Panic disorder [episodic paroxysmal anxiety] without agoraphobia: Secondary | ICD-10-CM | POA: Diagnosis present

## 2014-09-11 DIAGNOSIS — I959 Hypotension, unspecified: Secondary | ICD-10-CM | POA: Diagnosis present

## 2014-09-11 DIAGNOSIS — N170 Acute kidney failure with tubular necrosis: Secondary | ICD-10-CM

## 2014-09-11 DIAGNOSIS — N1832 Chronic kidney disease, stage 3b: Secondary | ICD-10-CM | POA: Diagnosis present

## 2014-09-11 DIAGNOSIS — E872 Acidosis, unspecified: Secondary | ICD-10-CM | POA: Diagnosis present

## 2014-09-11 DIAGNOSIS — E11621 Type 2 diabetes mellitus with foot ulcer: Secondary | ICD-10-CM

## 2014-09-11 DIAGNOSIS — M1990 Unspecified osteoarthritis, unspecified site: Secondary | ICD-10-CM | POA: Diagnosis present

## 2014-09-11 DIAGNOSIS — N179 Acute kidney failure, unspecified: Principal | ICD-10-CM | POA: Diagnosis present

## 2014-09-11 DIAGNOSIS — Z9114 Patient's other noncompliance with medication regimen: Secondary | ICD-10-CM | POA: Diagnosis present

## 2014-09-11 DIAGNOSIS — F1410 Cocaine abuse, uncomplicated: Secondary | ICD-10-CM | POA: Diagnosis present

## 2014-09-11 DIAGNOSIS — IMO0001 Reserved for inherently not codable concepts without codable children: Secondary | ICD-10-CM

## 2014-09-11 DIAGNOSIS — E785 Hyperlipidemia, unspecified: Secondary | ICD-10-CM | POA: Diagnosis present

## 2014-09-11 DIAGNOSIS — I9589 Other hypotension: Secondary | ICD-10-CM

## 2014-09-11 DIAGNOSIS — R739 Hyperglycemia, unspecified: Secondary | ICD-10-CM | POA: Diagnosis present

## 2014-09-11 HISTORY — DX: Acidosis, unspecified: E87.20

## 2014-09-11 LAB — CBC WITH DIFFERENTIAL/PLATELET
Basophils Absolute: 0 10*3/uL (ref 0.0–0.1)
Basophils Relative: 1 % (ref 0–1)
Eosinophils Absolute: 0.3 10*3/uL (ref 0.0–0.7)
Eosinophils Relative: 5 % (ref 0–5)
HCT: 35.5 % — ABNORMAL LOW (ref 36.0–46.0)
Hemoglobin: 11.2 g/dL — ABNORMAL LOW (ref 12.0–15.0)
Lymphocytes Relative: 28 % (ref 12–46)
Lymphs Abs: 1.8 10*3/uL (ref 0.7–4.0)
MCH: 30.1 pg (ref 26.0–34.0)
MCHC: 31.5 g/dL (ref 30.0–36.0)
MCV: 95.4 fL (ref 78.0–100.0)
Monocytes Absolute: 0.4 10*3/uL (ref 0.1–1.0)
Monocytes Relative: 6 % (ref 3–12)
Neutro Abs: 3.9 10*3/uL (ref 1.7–7.7)
Neutrophils Relative %: 60 % (ref 43–77)
Platelets: 122 10*3/uL — ABNORMAL LOW (ref 150–400)
RBC: 3.72 MIL/uL — ABNORMAL LOW (ref 3.87–5.11)
RDW: 15 % (ref 11.5–15.5)
WBC: 6.3 10*3/uL (ref 4.0–10.5)

## 2014-09-11 LAB — CBC
HCT: 40 % (ref 36.0–46.0)
Hemoglobin: 13 g/dL (ref 12.0–15.0)
MCH: 30.4 pg (ref 26.0–34.0)
MCHC: 32.5 g/dL (ref 30.0–36.0)
MCV: 93.7 fL (ref 78.0–100.0)
MPV: 11.5 fL (ref 9.4–12.4)
Platelets: 251 10*3/uL (ref 150–400)
RBC: 4.27 MIL/uL (ref 3.87–5.11)
RDW: 15.7 % — ABNORMAL HIGH (ref 11.5–15.5)
WBC: 8.6 10*3/uL (ref 4.0–10.5)

## 2014-09-11 LAB — URINALYSIS, ROUTINE W REFLEX MICROSCOPIC
Bilirubin Urine: NEGATIVE
Glucose, UA: NEGATIVE mg/dL
Ketones, ur: NEGATIVE mg/dL
Nitrite: NEGATIVE
Protein, ur: NEGATIVE mg/dL
Specific Gravity, Urine: 1.015 (ref 1.005–1.030)
Urobilinogen, UA: 0.2 mg/dL (ref 0.0–1.0)
pH: 5.5 (ref 5.0–8.0)

## 2014-09-11 LAB — I-STAT CG4 LACTIC ACID, ED: Lactic Acid, Venous: 1.42 mmol/L (ref 0.5–2.2)

## 2014-09-11 LAB — HEPATIC FUNCTION PANEL
ALT: 15 U/L (ref 0–35)
AST: 11 U/L (ref 0–37)
Albumin: 3.5 g/dL (ref 3.5–5.2)
Alkaline Phosphatase: 78 U/L (ref 39–117)
Bilirubin, Direct: <0.2 mg/dL (ref 0.0–0.3)
Total Bilirubin: 0.3 mg/dL (ref 0.3–1.2)
Total Protein: 6.8 g/dL (ref 6.0–8.3)

## 2014-09-11 LAB — BASIC METABOLIC PANEL
Anion gap: 12 (ref 5–15)
Anion gap: 13 (ref 5–15)
BUN: 59 mg/dL — ABNORMAL HIGH (ref 6–23)
BUN: 53 mg/dL — ABNORMAL HIGH (ref 6–23)
CO2: 18 mEq/L — ABNORMAL LOW (ref 19–32)
CO2: 20 mEq/L (ref 19–32)
Calcium: 9.3 mg/dL (ref 8.4–10.5)
Calcium: 9.1 mg/dL (ref 8.4–10.5)
Chloride: 109 mEq/L (ref 96–112)
Chloride: 107 mEq/L (ref 96–112)
Creatinine, Ser: 3.03 mg/dL — ABNORMAL HIGH (ref 0.50–1.10)
Creatinine, Ser: 2.78 mg/dL — ABNORMAL HIGH (ref 0.50–1.10)
GFR calc Af Amer: 20 mL/min — ABNORMAL LOW (ref >90)
GFR calc Af Amer: 22 mL/min — ABNORMAL LOW (ref >90)
GFR calc non Af Amer: 17 mL/min — ABNORMAL LOW (ref >90)
GFR calc non Af Amer: 19 mL/min — ABNORMAL LOW (ref >90)
Glucose, Bld: 122 mg/dL — ABNORMAL HIGH (ref 70–99)
Glucose, Bld: 131 mg/dL — ABNORMAL HIGH (ref 70–99)
Potassium: 6.1 mEq/L — ABNORMAL HIGH (ref 3.7–5.3)
Potassium: 5.1 mEq/L (ref 3.7–5.3)
Sodium: 139 mEq/L (ref 137–147)
Sodium: 140 mEq/L (ref 137–147)

## 2014-09-11 LAB — COMPLETE METABOLIC PANEL WITH GFR
ALT: 19 U/L (ref 0–35)
AST: 17 U/L (ref 0–37)
Albumin: 4.3 g/dL (ref 3.5–5.2)
Alkaline Phosphatase: 75 U/L (ref 39–117)
BUN: 52 mg/dL — ABNORMAL HIGH (ref 6–23)
CO2: 20 mEq/L (ref 19–32)
Calcium: 9.7 mg/dL (ref 8.4–10.5)
Chloride: 108 mEq/L (ref 96–112)
Creat: 3.08 mg/dL — ABNORMAL HIGH (ref 0.50–1.10)
GFR, Est African American: 19 mL/min — ABNORMAL LOW
GFR, Est Non African American: 17 mL/min — ABNORMAL LOW
Glucose, Bld: 96 mg/dL (ref 70–99)
Potassium: 6.5 mEq/L (ref 3.5–5.3)
Sodium: 137 mEq/L (ref 135–145)
Total Bilirubin: 0.5 mg/dL (ref 0.2–1.2)
Total Protein: 7.2 g/dL (ref 6.0–8.3)

## 2014-09-11 LAB — LIPID PANEL
Cholesterol: 134 mg/dL (ref 0–200)
HDL: 32 mg/dL — ABNORMAL LOW (ref >39)
LDL Cholesterol: 68 mg/dL (ref 0–99)
Total CHOL/HDL Ratio: 4.2 Ratio
Triglycerides: 171 mg/dL — ABNORMAL HIGH (ref <150)
VLDL: 34 mg/dL (ref 0–40)

## 2014-09-11 LAB — GLUCOSE, CAPILLARY
Glucose-Capillary: 168 mg/dL — ABNORMAL HIGH (ref 70–99)
Glucose-Capillary: 147 mg/dL — ABNORMAL HIGH (ref 70–99)

## 2014-09-11 LAB — HEMOGLOBIN A1C
Hgb A1c MFr Bld: 7.1 % — ABNORMAL HIGH (ref <5.7)
Mean Plasma Glucose: 157 mg/dL — ABNORMAL HIGH (ref <117)

## 2014-09-11 LAB — I-STAT CHEM 8, ED
BUN: 58 mg/dL — ABNORMAL HIGH (ref 6–23)
Calcium, Ion: 1.25 mmol/L — ABNORMAL HIGH (ref 1.12–1.23)
Chloride: 111 mEq/L (ref 96–112)
Creatinine, Ser: 3.4 mg/dL — ABNORMAL HIGH (ref 0.50–1.10)
Glucose, Bld: 142 mg/dL — ABNORMAL HIGH (ref 70–99)
HCT: 35 % — ABNORMAL LOW (ref 36.0–46.0)
Hemoglobin: 11.9 g/dL — ABNORMAL LOW (ref 12.0–15.0)
Potassium: 5.6 mEq/L — ABNORMAL HIGH (ref 3.7–5.3)
Sodium: 138 mEq/L (ref 137–147)
TCO2: 17 mmol/L (ref 0–100)

## 2014-09-11 LAB — URINE MICROSCOPIC-ADD ON

## 2014-09-11 LAB — RAPID URINE DRUG SCREEN, HOSP PERFORMED
Amphetamines: NOT DETECTED
Barbiturates: NOT DETECTED
Benzodiazepines: NOT DETECTED
Cocaine: NOT DETECTED
Opiates: NOT DETECTED
Tetrahydrocannabinol: NOT DETECTED

## 2014-09-11 LAB — TYPE AND SCREEN
ABO/RH(D): O POS
Antibody Screen: NEGATIVE

## 2014-09-11 LAB — VITAMIN D 25 HYDROXY (VIT D DEFICIENCY, FRACTURES): Vit D, 25-Hydroxy: <10 ng/mL — ABNORMAL LOW (ref 30–100)

## 2014-09-11 LAB — TSH: TSH: 0.774 u[IU]/mL (ref 0.350–4.500)

## 2014-09-11 MED ORDER — INSULIN ASPART 100 UNIT/ML IV SOLN
10.0000 [IU] | Freq: Once | INTRAVENOUS | Status: AC
  Administered 2014-09-11: 10 [IU] via INTRAVENOUS

## 2014-09-11 MED ORDER — SODIUM CHLORIDE 0.9 % IV BOLUS (SEPSIS)
500.0000 mL | Freq: Once | INTRAVENOUS | Status: AC
  Administered 2014-09-11: 500 mL via INTRAVENOUS

## 2014-09-11 MED ORDER — NICOTINE 21 MG/24HR TD PT24
21.0000 mg | MEDICATED_PATCH | Freq: Every day | TRANSDERMAL | Status: DC
  Administered 2014-09-11 – 2014-09-13 (×3): 21 mg via TRANSDERMAL
  Filled 2014-09-11 (×3): qty 1

## 2014-09-11 MED ORDER — OXYCODONE-ACETAMINOPHEN 5-325 MG PO TABS
1.0000 | ORAL_TABLET | Freq: Four times a day (QID) | ORAL | Status: DC | PRN
  Administered 2014-09-11 – 2014-09-13 (×6): 1 via ORAL
  Filled 2014-09-11 (×6): qty 1

## 2014-09-11 MED ORDER — FUROSEMIDE 10 MG/ML IJ SOLN
40.0000 mg | Freq: Once | INTRAMUSCULAR | Status: AC
  Administered 2014-09-11: 40 mg via INTRAVENOUS
  Filled 2014-09-11: qty 4

## 2014-09-11 MED ORDER — CLOPIDOGREL BISULFATE 75 MG PO TABS
75.0000 mg | ORAL_TABLET | Freq: Every day | ORAL | Status: DC
  Administered 2014-09-12 – 2014-09-13 (×2): 75 mg via ORAL
  Filled 2014-09-11 (×2): qty 1

## 2014-09-11 MED ORDER — HYDROXYZINE HCL 25 MG PO TABS: 25.0000 mg | ORAL_TABLET | ORAL | Status: DC | PRN

## 2014-09-11 MED ORDER — ROSUVASTATIN CALCIUM 20 MG PO TABS
40.0000 mg | ORAL_TABLET | Freq: Every day | ORAL | Status: DC
  Administered 2014-09-11 – 2014-09-13 (×3): 40 mg via ORAL
  Filled 2014-09-11 (×3): qty 2

## 2014-09-11 MED ORDER — ACETAMINOPHEN 650 MG RE SUPP: 650.0000 mg | Freq: Four times a day (QID) | RECTAL | Status: DC | PRN

## 2014-09-11 MED ORDER — ACETAMINOPHEN 325 MG PO TABS
650.0000 mg | ORAL_TABLET | Freq: Four times a day (QID) | ORAL | Status: DC | PRN
  Administered 2014-09-12 (×2): 650 mg via ORAL
  Filled 2014-09-11 (×2): qty 2

## 2014-09-11 MED ORDER — DEXTROSE 50 % IV SOLN
INTRAVENOUS | Status: AC
  Filled 2014-09-11: qty 50

## 2014-09-11 MED ORDER — TRAZODONE HCL 50 MG PO TABS
50.0000 mg | ORAL_TABLET | Freq: Every evening | ORAL | Status: DC | PRN
  Administered 2014-09-11 – 2014-09-12 (×3): 50 mg via ORAL
  Filled 2014-09-11 (×3): qty 1

## 2014-09-11 MED ORDER — ONDANSETRON HCL 4 MG/2ML IJ SOLN: 4.0000 mg | Freq: Four times a day (QID) | INTRAMUSCULAR | Status: DC | PRN

## 2014-09-11 MED ORDER — METOPROLOL TARTRATE 25 MG PO TABS
12.5000 mg | ORAL_TABLET | Freq: Two times a day (BID) | ORAL | Status: DC
  Administered 2014-09-11 – 2014-09-13 (×4): 12.5 mg via ORAL
  Filled 2014-09-11 (×4): qty 1

## 2014-09-11 MED ORDER — INSULIN ASPART 100 UNIT/ML ~~LOC~~ SOLN
0.0000 [IU] | Freq: Three times a day (TID) | SUBCUTANEOUS | Status: DC
  Administered 2014-09-12 (×2): 2 [IU] via SUBCUTANEOUS
  Administered 2014-09-13: 1 [IU] via SUBCUTANEOUS
  Administered 2014-09-13: 2 [IU] via SUBCUTANEOUS

## 2014-09-11 MED ORDER — INSULIN ASPART 100 UNIT/ML ~~LOC~~ SOLN
SUBCUTANEOUS | Status: AC
Start: 2014-09-11 — End: 2014-09-12
  Filled 2014-09-11: qty 1

## 2014-09-11 MED ORDER — GABAPENTIN 300 MG PO CAPS
300.0000 mg | ORAL_CAPSULE | Freq: Two times a day (BID) | ORAL | Status: DC
  Administered 2014-09-11 – 2014-09-13 (×4): 300 mg via ORAL
  Filled 2014-09-11 (×4): qty 1

## 2014-09-11 MED ORDER — SODIUM CHLORIDE 0.9 % IV BOLUS (SEPSIS)
1000.0000 mL | Freq: Once | INTRAVENOUS | Status: AC
  Administered 2014-09-11: 1000 mL via INTRAVENOUS

## 2014-09-11 MED ORDER — DEXTROSE 50 % IV SOLN
1.0000 | Freq: Once | INTRAVENOUS | Status: AC
  Administered 2014-09-11: 50 mL via INTRAVENOUS

## 2014-09-11 MED ORDER — ONDANSETRON HCL 4 MG PO TABS: 4.0000 mg | ORAL_TABLET | Freq: Four times a day (QID) | ORAL | Status: DC | PRN

## 2014-09-11 MED ORDER — SODIUM CHLORIDE 0.9 % IJ SOLN
3.0000 mL | Freq: Two times a day (BID) | INTRAMUSCULAR | Status: DC
  Administered 2014-09-12: 3 mL via INTRAVENOUS

## 2014-09-11 MED ORDER — FLUOXETINE HCL 20 MG PO CAPS
20.0000 mg | ORAL_CAPSULE | Freq: Every day | ORAL | Status: DC
  Administered 2014-09-11 – 2014-09-13 (×3): 20 mg via ORAL
  Filled 2014-09-11 (×3): qty 1

## 2014-09-11 MED ORDER — SODIUM POLYSTYRENE SULFONATE 15 GM/60ML PO SUSP
30.0000 g | Freq: Once | ORAL | Status: AC
  Administered 2014-09-12: 30 g via ORAL
  Filled 2014-09-11: qty 120

## 2014-09-11 MED ORDER — PANTOPRAZOLE SODIUM 40 MG PO TBEC
40.0000 mg | DELAYED_RELEASE_TABLET | Freq: Every day | ORAL | Status: DC
  Administered 2014-09-11 – 2014-09-13 (×3): 40 mg via ORAL
  Filled 2014-09-11 (×3): qty 1

## 2014-09-11 MED ORDER — HEPARIN SODIUM (PORCINE) 5000 UNIT/ML IJ SOLN
5000.0000 [IU] | Freq: Three times a day (TID) | INTRAMUSCULAR | Status: DC
  Administered 2014-09-11 – 2014-09-13 (×7): 5000 [IU] via SUBCUTANEOUS
  Filled 2014-09-11 (×7): qty 1

## 2014-09-11 MED ORDER — SODIUM CHLORIDE 0.9 % IV SOLN
INTRAVENOUS | Status: DC
Start: 2014-09-11 — End: 2014-09-13
  Administered 2014-09-11 – 2014-09-13 (×5): via INTRAVENOUS

## 2014-09-11 NOTE — ED Provider Notes (Signed)
CSN: [id number]     Arrival date & time 09/11/14  1035 History  This chart was scribed for Sharyon Cable, MD by Edison Simon, ED Scribe. This patient was seen in room APA10/[facility]10 and the patient's care was started at 11:26 AM.    Chief Complaint  Patient presents with  . Abnormal Lab   Patient is a 48 y.o. female presenting with weakness. The history is provided by the patient. No language interpreter was used.  Weakness This is a new problem. The current episode started more than 2 days ago. The problem occurs daily. The problem has been gradually worsening. Associated symptoms include shortness of breath. Pertinent negatives include no chest pain and no abdominal pain. Nothing aggravates the symptoms. Nothing relieves the symptoms.    HPI Comments: [name] is a 48 y.o. female who presents to the Emergency Department, referred here for abnormal lab results. She states she saw Dr. Moshe Cipro yesterday and was notified this morning that she had hyperkalemia, dehydration, and low kidney function and referred here. She states that her blood pressure and blood sugar readings yesterday were benign; she reports history of HTN and diabetes. She states she has stopped her blood pressure medication. She states that she has had diarrhea for 3 days, SOB, fatigue, and intermittent back pain. She also notes blood after having a BM, she seems unsure if it was rectal or vaginal in origin; she uses Plavix. She denies fever, vomiting, chest pain, abdominal pain, or syncope.  Her course is worsening.  Nothing improves her symptoms  PMH - Hypertension Soc hx - h/o drug use  Past Medical History  Diagnosis Date  . Alcohol use     Now abstinent.  . Diabetes mellitus, type 2   . Obesity   . Hyperlipidemia   . Hypertension   . Anxiety   . Panic attacks   . Hypercholesteremia   . Diabetic Charcot foot 12/30/2011  . RECTAL BLEEDING, HX OF 12/05/2009    Qualifier: Diagnosis of  By: Zeb Comfort    .  Ankle fracture, lateral malleolus, closed 12/30/2011  . Breast mass, left 12/15/2011  . Tobacco abuse   . ANXIETY 04/17/2010    Qualifier: Diagnosis of  By: Claybon Jabs PA, Dawn    . Hypertensive cardiomyopathy 11/08/2012    Ejection fraction 40-45%.  . Diastolic dysfunction 0/93/8182    Grade 1.  . CKD (chronic kidney disease)   . Noncompliance with medication regimen    Past Surgical History  Procedure Laterality Date  . Dilation and curettage of uterus    . Breast biopsy     Family History  Problem Relation Age of Onset  . Hypertension Mother   . Hypertension Father     CABG   . Hypertension Sister   . Heart attack Mother   . Arthritis    . Cancer    . Diabetes    . Asthma     History  Substance Use Topics  . Smoking status: Current Every Day Smoker -- 0.50 packs/day for 30 years    Types: Cigarettes  . Smokeless tobacco: Never Used  . Alcohol Use: Yes     Comment: wine and mickeys    OB History    No data available     Review of Systems  Constitutional: Positive for fatigue. Negative for fever.  Respiratory: Positive for shortness of breath.   Cardiovascular: Negative for chest pain.  Gastrointestinal: Positive for diarrhea and anal bleeding. Negative for vomiting and  abdominal pain.  Genitourinary: Positive for vaginal bleeding.  Musculoskeletal: Positive for back pain.  Neurological: Positive for weakness. Negative for syncope.  All other systems reviewed and are negative.     Allergies  Review of patient's allergies indicates no known allergies.  Home Medications   Prior to Admission medications   Medication Sig Start Date End Date Taking? Authorizing Provider  acetaminophen (TYLENOL) 500 MG tablet Take 1 tablet (500 mg total) by mouth daily. 09/07/14  Yes [name], MD  amLODipine (NORVASC) 10 MG tablet Take 1 tablet (10 mg total) by mouth daily. 05/24/14 05/24/15 Yes [name], MD  benazepril (LOTENSIN) 40 MG tablet TAKE ONE TABLET BY  MOUTH DAILY. 07/09/14  Yes [name], MD  clopidogrel (PLAVIX) 75 MG tablet Take 1 tablet (75 mg total) by mouth daily with breakfast. 05/24/14  Yes [name], MD  FLUoxetine (PROZAC) 20 MG capsule Take 1 capsule (20 mg total) by mouth daily. 05/24/14  Yes [name], MD  furosemide (LASIX) 40 MG tablet Take 1 tablet (40 mg total) by mouth daily. 05/24/14  Yes [name], MD  gabapentin (NEURONTIN) 300 MG capsule Take 1 capsule (300 mg total) by mouth 2 (two) times daily. 09/10/14  Yes [name], MD  hydrOXYzine (ATARAX/VISTARIL) 25 MG tablet Take 1 tablet (25 mg total) by mouth every 4 (four) hours as needed for anxiety (Sleep). 05/05/14  Yes Nanci Pina, FNP  ibuprofen (ADVIL,MOTRIN) 800 MG tablet Take 800 mg by mouth every 8 (eight) hours as needed (pain).   Yes Historical Provider, MD  metoprolol tartrate (LOPRESSOR) 25 MG tablet Take 0.5 tablets (12.5 mg total) by mouth 2 (two) times daily. 05/24/14  Yes [name], MD  pantoprazole (PROTONIX) 40 MG tablet Take 1 tablet (40 mg total) by mouth daily. 05/24/14  Yes [name], MD  potassium chloride SA (K-DUR,KLOR-CON) 20 MEQ tablet Take 30 mEq by mouth daily.   Yes Historical Provider, MD  rosuvastatin (CRESTOR) 40 MG tablet Take 1 tablet (40 mg total) by mouth daily. 05/24/14  Yes [name], MD  traMADol (ULTRAM) 50 MG tablet TAKE 1 TABLET BY MOUTH TWICE DAILY. 08/13/14  Yes [name], MD  traZODone (DESYREL) 50 MG tablet Take 1 tablet (50 mg total) by mouth at bedtime and may repeat dose one time if needed. 05/24/14  Yes [name], MD  triamterene-hydrochlorothiazide (MAXZIDE-25) 37.5-25 MG per tablet Take 1 tablet by mouth daily.   Yes Historical Provider, MD  ibuprofen (ADVIL,MOTRIN) 800 MG tablet TAKE (1) TABLET BY MOUTH EVERY EIGHT HOURS AS NEEDED. Patient not taking: Reported on 09/11/2014 07/16/14   [name], MD  insulin aspart (NOVOLOG FLEXPEN) 100  UNIT/ML FlexPen Inject 4 Units into the skin 3 (three) times daily with meals. Patient not taking: Reported on 09/11/2014 05/24/14   [name], MD  nicotine (NICODERM CQ - DOSED IN MG/24 HOURS) 21 mg/24hr patch Place 1 patch (21 mg total) onto the skin daily. Patient not taking: Reported on 09/11/2014 05/05/14   Nanci Pina, FNP  potassium chloride SA (K-DUR,KLOR-CON) 20 MEQ tablet TAKE 1&1/2 TABLETS BY MOUTH EVERY DAY. Patient not taking: Reported on 09/11/2014 07/09/14   [name], MD   BP 81/61 mmHg  Pulse 98  Temp(Src) 97.4 F (36.3 C) (Oral)  Resp 22  Ht 5\' 1"  (1.549 m)  Wt 170 lb (77.111 kg)  BMI 32.14 kg/m2  SpO2 100% Physical Exam  Nursing note and vitals reviewed.  CONSTITUTIONAL: Well developed/well nourished HEAD: Normocephalic/atraumatic EYES: EOMI/PERRL ENMT: Mucous membranes moist NECK: supple no meningeal signs SPINE/BACK:entire spine nontender CV: S1/S2 noted, no murmurs/rubs/gallops noted LUNGS: Lungs are clear to auscultation bilaterally, no apparent distress ABDOMEN: soft, nontender, no rebound or guarding, bowel sounds noted throughout abdomen GU:no cva tenderness, no obvious vaginal bleeding, no rectal bleeding, female chaperone present NEURO: Pt is awake/alert/appropriate, moves all extremitiesx4.  No facial droop.   EXTREMITIES: pulses normal/equal, full ROM SKIN: warm, color normal PSYCH: no abnormalities of mood noted, alert and oriented to situation  ED Course  Procedures  CRITICAL CARE Performed by: Sharyon Cable Total critical care time: 45 Critical care time was exclusive of separately billable procedures and treating other patients. Critical care was necessary to treat or prevent imminent or life-threatening deterioration. Critical care was time spent personally by me on the following activities: development of treatment plan with patient and/or surrogate as well as nursing, discussions with consultants, evaluation of  patient's response to treatment, examination of patient, obtaining history from patient or surrogate, ordering and performing treatments and interventions, ordering and review of laboratory studies, ordering and review of radiographic studies, pulse oximetry and re-evaluation of patient's condition. PATIENT WITH HYPOTENSION, HYPERKALEMIA, ACUTE RENAL FAILURE SHE REQUIRED IV FLUIDS FOR HYPOTENSION AND TREATMENT OF HYPERKALEMIA  DIAGNOSTIC STUDIES: Oxygen Saturation is 100% on room air, normal by my interpretation.    COORDINATION OF CARE: 11:36 AM Discussed treatment plan with patient at beside, the patient agrees with the plan and has no further questions at this time.  2:47 PM I spoke to DR Moshe Cipro about this patient - pt normally has hypertension now with hypotension She also has h/o drug use per PCP Pt noted to have worsening renal failure and also hyperkalemia Her hypotension has responded to IV fluids D/w dr Clementeen Graham, will admit for rehydration and monitoring.  Given recent diarrhea this may have precipitated the acute renal failure   Medications  furosemide (LASIX) injection 40 mg (not administered)  insulin aspart (novoLOG) injection 10 Units (not administered)  dextrose 50 % solution 50 mL (not administered)  sodium chloride 0.9 % bolus 500 mL (0 mLs Intravenous Stopped 09/11/14 1237)  sodium chloride 0.9 % bolus 1,000 mL (0 mLs Intravenous Stopped 09/11/14 1324)  sodium chloride 0.9 % bolus 500 mL (500 mLs Intravenous New Bag/Given 09/11/14 1324)     Labs Review Labs Reviewed  CBC WITH DIFFERENTIAL - Abnormal; Notable for the following:    RBC 3.72 (*)    Hemoglobin 11.2 (*)    HCT 35.5 (*)    Platelets 122 (*)    All other components within normal limits  BASIC METABOLIC PANEL - Abnormal; Notable for the following:    Potassium 6.1 (*)    CO2 18 (*)    Glucose, Bld 122 (*)    BUN 59 (*)    Creatinine, Ser 3.03 (*)    GFR calc non Af Amer 17 (*)    GFR calc Af Amer  20 (*)    All other components within normal limits  URINALYSIS, ROUTINE W REFLEX MICROSCOPIC - Abnormal; Notable for the following:    Hgb urine dipstick SMALL (*)    Leukocytes, UA TRACE (*)    All other components within normal limits  URINE MICROSCOPIC-ADD ON - Abnormal; Notable for the following:    Squamous Epithelial / LPF MANY (*)    All other components within normal limits  I-STAT CHEM 8, ED - Abnormal;  Notable for the following:    Potassium 5.6 (*)    BUN 58 (*)    Creatinine, Ser 3.40 (*)    Glucose, Bld 142 (*)    Calcium, Ion 1.25 (*)    Hemoglobin 11.9 (*)    HCT 35.0 (*)    All other components within normal limits  I-STAT CG4 LACTIC ACID, ED  POC OCCULT BLOOD, ED  TYPE AND SCREEN    Imaging Review Dg Chest Portable 1 View  09/11/2014   CLINICAL DATA:  Cough, congestion, shortness of breath for 2 weeks  EXAM: PORTABLE CHEST - 1 VIEW  COMPARISON:  11/17/2013  FINDINGS: Cardiomediastinal silhouette is stable. No acute infiltrate or pleural effusion. No pulmonary edema. Mild degenerative changes lower thoracic spine.  IMPRESSION: No active disease.   Electronically Signed   By: Lahoma Crocker M.D.   On: 09/11/2014 12:26     EKG Interpretation   Date/Time:  Tuesday September 11 2014 11:06:40 EST Ventricular Rate:  83 PR Interval:  130 QRS Duration: 86 QT Interval:  365 QTC Calculation: 429 R Axis:   -22 Text Interpretation:  Sinus rhythm Borderline left axis deviation No  significant change since last tracing Confirmed by Christy Gentles  MD, [place]  [phone]) on 09/11/2014 11:23:40 AM      MDM   Final diagnoses:  Dyspnea  Acute renal failure, unspecified acute renal failure type  Hyperkalemia  Hypotension, unspecified hypotension type  Dehydration    Nursing notes including past medical history and social history reviewed and considered in documentation xrays/imaging reviewed by myself and considered during evaluation Labs/vital reviewed myself and considered  during evaluation Previous records reviewed and considered    I personally performed the services described in this documentation, which was scribed in my presence. The recorded information has been reviewed and is accurate.     Sharyon Cable, MD 09/11/14 [phone]

## 2014-09-11 NOTE — H&P (Addendum)
[facility] History and Physical  MANUEL LAWHEAD QQP:[id number] DOB: 1966/02/21 DOA: 09/11/2014  Referring physician: Ripley Fraise, MD PCP: Tula Nakayama, MD   Chief Complaint:  Sent from PCP office for abnormal labs  HPI:  48 year old African-American female with history of hypertension, hyperlipidemia,active tobacco use, chronic kidney disease stage III, type 2 diabetes mellitus not on medications, history of polysubstance abuse ( cocaine use, reportedly in half since for past 4 months), was sent to the ED by her PCP for abnormal labs including hyperkalemia and acute on chronic kidney disease. Patient reports having diarrhea like symptoms for the past 3 days (reports having 3 episodes of loose diarrhea 2 days back, 2 episodes yesterday and one episodes today) . She denies any fevers, malaise, headache, dizziness, chills, nausea, vomiting, chest pain, palpitations, shortness of breath, abdominal pain, urinary symptoms. She does report having an episode of small amount of rectal bleed. Denies any travel or sick contacts. She reports taking ibuprofen off and on for pain ( reports not taking it since past 2 weeks). She went to see her PCP yesterday for routine visit and had blood work done showed elevated potassium (potassium of 6.5) and acute on chronic kidney disease (creatinine of 3.08) and she was referred to ED. In the ED patient was found to be hypotensive with blood pressure of 75/50 mmHg. Blood work repeated showed hemoglobin of 11.2, platelet of 122, potassium of 6.1, CO2 of 18,(with anion gap of 12) BUN of 59 creatinine of 2.03. Chest x-ray was unremarkable. He should given dextrose with IV insulin and 40 mg IV Lasix. Hospitalist admission requested to telemetry.   Review of Systems:  Constitutional: Denies fever, chills, diaphoresis, appetite change . Reports fatigue. HEENT: Denies photophobia, eye pain, ear pain, sore throat, trouble swallowing, neck pain. Respiratory:  Denies SOB, DOE, cough, chest tightness and wheezing.   Cardiovascular: Denies chest pain, palpitations and leg swelling.  Gastrointestinal: diarrhea+, ? Blood in stool, Denies nausea, vomiting, abdominal pain, constipation, blood in and abdominal distention.  Genitourinary: Denies dysuria, urgency, frequency, hematuria, flank pain and difficulty urinating.  Endocrine: Denies polyuria, polydipsia. Musculoskeletal: Denies myalgias, back pain, joint pain. Skin: Denies rash and wound.  Neurological: Denies dizziness syncope, weakness, light-headedness, and headaches.  Psychiatric/Behavioral: Denies  confusion   Past Medical History  Diagnosis Date  . Alcohol use     Now abstinent.  . Diabetes mellitus, type 2   . Obesity   . Hyperlipidemia   . Hypertension   . Anxiety   . Panic attacks   . Hypercholesteremia   . Diabetic Charcot foot 12/30/2011  . RECTAL BLEEDING, HX OF 12/05/2009    Qualifier: Diagnosis of  By: Zeb Comfort    . Ankle fracture, lateral malleolus, closed 12/30/2011  . Breast mass, left 12/15/2011  . Tobacco abuse   . ANXIETY 04/17/2010    Qualifier: Diagnosis of  By: Claybon Jabs PA, Dawn    . Hypertensive cardiomyopathy 11/08/2012    Ejection fraction 40-45%.  . Diastolic dysfunction 05/07/4579    Grade 1.  . CKD (chronic kidney disease)   . Noncompliance with medication regimen    Past Surgical History  Procedure Laterality Date  . Dilation and curettage of uterus    . Breast biopsy     Social History:  reports that she has been smoking Cigarettes.  She has a 15 pack-year smoking history. She has never used smokeless tobacco. She reports that she drinks alcohol. She reports that she uses illicit drugs ("Crack" cocaine).  No Known Allergies  Family History  Problem Relation Age of Onset  . Hypertension Mother   . Hypertension Father     CABG   . Hypertension Sister   . Heart attack Mother   . Arthritis    . Cancer    . Diabetes    . Asthma      Prior to  Admission medications   Medication Sig Start Date End Date Taking? Authorizing Provider  acetaminophen (TYLENOL) 500 MG tablet Take 1 tablet (500 mg total) by mouth daily. 09/07/14  Yes [name], MD  amLODipine (NORVASC) 10 MG tablet Take 1 tablet (10 mg total) by mouth daily. 05/24/14 05/24/15 Yes [name], MD  benazepril (LOTENSIN) 40 MG tablet TAKE ONE TABLET BY MOUTH DAILY. 07/09/14  Yes [name], MD  clopidogrel (PLAVIX) 75 MG tablet Take 1 tablet (75 mg total) by mouth daily with breakfast. 05/24/14  Yes [name], MD  FLUoxetine (PROZAC) 20 MG capsule Take 1 capsule (20 mg total) by mouth daily. 05/24/14  Yes [name], MD  furosemide (LASIX) 40 MG tablet Take 1 tablet (40 mg total) by mouth daily. 05/24/14  Yes [name], MD  gabapentin (NEURONTIN) 300 MG capsule Take 1 capsule (300 mg total) by mouth 2 (two) times daily. 09/10/14  Yes [name], MD  hydrOXYzine (ATARAX/VISTARIL) 25 MG tablet Take 1 tablet (25 mg total) by mouth every 4 (four) hours as needed for anxiety (Sleep). 05/05/14  Yes Nanci Pina, FNP  ibuprofen (ADVIL,MOTRIN) 800 MG tablet Take 800 mg by mouth every 8 (eight) hours as needed (pain).   Yes Historical Provider, MD  metoprolol tartrate (LOPRESSOR) 25 MG tablet Take 0.5 tablets (12.5 mg total) by mouth 2 (two) times daily. 05/24/14  Yes [name], MD  pantoprazole (PROTONIX) 40 MG tablet Take 1 tablet (40 mg total) by mouth daily. 05/24/14  Yes [name], MD  potassium chloride SA (K-DUR,KLOR-CON) 20 MEQ tablet Take 30 mEq by mouth daily.   Yes Historical Provider, MD  rosuvastatin (CRESTOR) 40 MG tablet Take 1 tablet (40 mg total) by mouth daily. 05/24/14  Yes [name], MD  traMADol (ULTRAM) 50 MG tablet TAKE 1 TABLET BY MOUTH TWICE DAILY. 08/13/14  Yes [name], MD  traZODone (DESYREL) 50 MG tablet Take 1 tablet (50 mg total) by mouth at bedtime and may repeat dose one  time if needed. 05/24/14  Yes [name], MD  triamterene-hydrochlorothiazide (MAXZIDE-25) 37.5-25 MG per tablet Take 1 tablet by mouth daily.   Yes Historical Provider, MD  ibuprofen (ADVIL,MOTRIN) 800 MG tablet TAKE (1) TABLET BY MOUTH EVERY EIGHT HOURS AS NEEDED. Patient not taking: Reported on 09/11/2014 07/16/14   [name], MD  insulin aspart (NOVOLOG FLEXPEN) 100 UNIT/ML FlexPen Inject 4 Units into the skin 3 (three) times daily with meals. Patient not taking: Reported on 09/11/2014 05/24/14   [name], MD  nicotine (NICODERM CQ - DOSED IN MG/24 HOURS) 21 mg/24hr patch Place 1 patch (21 mg total) onto the skin daily. Patient not taking: Reported on 09/11/2014 05/05/14   Nanci Pina, FNP  potassium chloride SA (K-DUR,KLOR-CON) 20 MEQ tablet TAKE 1&1/2 TABLETS BY MOUTH EVERY DAY. Patient not taking: Reported on 09/11/2014 07/09/14   [name], MD     Physical Exam:  Filed Vitals:   09/11/14 1330 09/11/14 1345 09/11/14 1415 09/11/14 1430  BP: 101/77 102/85 114/86 96/67  Pulse:  74 62    Temp:      TempSrc:      Resp: 14 23 18 14   Height:      Weight:      SpO2: 99% 100%      Constitutional: Vital signs reviewed.  Patient is a well-developed and well-nourished in no acute distress. HEENT: no pallor, no icterus, moist oral mucosa,  Cardiovascular: RRR, S1 normal, S2 normal, no MRG Chest: CTAB, no wheezes, rales, or rhonchi Abdominal: Soft. Non-tender, non-distended, bowel sounds are normal,  Ext: warm, no edema Neurological: A&O x3, non focal  Labs on Admission:  Basic Metabolic Panel:  Recent Labs Lab 09/10/14 1606 09/11/14 1152 09/11/14 1158  NA 137 138 139  K 6.5* 5.6* 6.1*  CL 108 111 109  CO2 20  --  18*  GLUCOSE 96 142* 122*  BUN 52* 58* 59*  CREATININE 3.08* 3.40* 3.03*  CALCIUM 9.7  --  9.3   Liver Function Tests:  Recent Labs Lab 09/10/14 1606  AST 17  ALT 19  ALKPHOS 75  BILITOT 0.5  PROT 7.2  ALBUMIN 4.3    No results for input(s): LIPASE, AMYLASE in the last 168 hours. No results for input(s): AMMONIA in the last 168 hours. CBC:  Recent Labs Lab 09/10/14 1606 09/11/14 1152 09/11/14 1158  WBC 8.6  --  6.3  NEUTROABS  --   --  3.9  HGB 13.0 11.9* 11.2*  HCT 40.0 35.0* 35.5*  MCV 93.7  --  95.4  PLT 251  --  122*   Cardiac Enzymes: No results for input(s): CKTOTAL, CKMB, CKMBINDEX, TROPONINI in the last 168 hours. BNP: Invalid input(s): POCBNP CBG: No results for input(s): GLUCAP in the last 168 hours.  Radiological Exams on Admission: Dg Chest Portable 1 View  09/11/2014   CLINICAL DATA:  Cough, congestion, shortness of breath for 2 weeks  EXAM: PORTABLE CHEST - 1 VIEW  COMPARISON:  11/17/2013  FINDINGS: Cardiomediastinal silhouette is stable. No acute infiltrate or pleural effusion. No pulmonary edema. Mild degenerative changes lower thoracic spine.  IMPRESSION: No active disease.   Electronically Signed   By: Lahoma Crocker M.D.   On: 09/11/2014 12:26    EKG: NSR, no ST-T changes  Assessment/Plan Principal Problem:   Acute renal failure superimposed on stage 3 chronic kidney disease -possibly due to dehydration with diarrhea, ? Substance use or ongoing use of nephrotoxins. -place on IV hydration, avoid nephrotoxins, check renal US. Check urine drug screen -patient on multiple medication including Lasix,Maxzide, Lotensin and NSAIDs which could all contributed to acute kidney injury.    Active Problems: Hyperkalemia Monitor on telemetry. Hold Lotensin. Given IV insulin with dextrose in the ED. Check repeat potassium. Ordered 30 mg Kayexalate. Patient is on 30 mEq potassium regularly which has been held.     Essential hypertension Patient had episode of low blood pressure on admission.hold all blood pressure medications except for metoprolol.  Type 2 diabetes mellitus Not on any medications.A1c of 7.1.  Place on sliding scale insulin.      Polysubstance abuse Reports  being clean for past few months. Check urine drug screen   ?rectal bleed Mild drop in H&H from baseline. Check stool for occult blood  Thrombocytopenia Mild.  Diet:regular  DVT prophylaxis: subcutaneous heparin   Code Status: full code Family Communication:  None at bedside Disposition Plan: home once improved possibly in 1-2 days  Laticia Vannostrand, [place] [facility] Pager [phone]  Total time spent on admission :70 minutes  If 7PM-7AM, please contact night-coverage [email or web address] Password [email or web address] 09/11/2014, 3:31 PM

## 2014-09-11 NOTE — ED Notes (Signed)
Waiting on Pharmacy to verify and release new orders.

## 2014-09-11 NOTE — ED Notes (Signed)
Pt states she was at Dr. Griffin Dakin office yesterday and had blood work drawn. States she was called this morning and told her potassium was high and she was dehydrated

## 2014-09-12 ENCOUNTER — Inpatient Hospital Stay (HOSPITAL_COMMUNITY): Payer: PRIVATE HEALTH INSURANCE

## 2014-09-12 DIAGNOSIS — F1910 Other psychoactive substance abuse, uncomplicated: Secondary | ICD-10-CM

## 2014-09-12 DIAGNOSIS — I10 Essential (primary) hypertension: Secondary | ICD-10-CM

## 2014-09-12 LAB — BASIC METABOLIC PANEL
Anion gap: 12 (ref 5–15)
BUN: 50 mg/dL — ABNORMAL HIGH (ref 6–23)
CO2: 19 mEq/L (ref 19–32)
Calcium: 8.9 mg/dL (ref 8.4–10.5)
Chloride: 110 mEq/L (ref 96–112)
Creatinine, Ser: 2.44 mg/dL — ABNORMAL HIGH (ref 0.50–1.10)
GFR calc Af Amer: 25 mL/min — ABNORMAL LOW (ref >90)
GFR calc non Af Amer: 22 mL/min — ABNORMAL LOW (ref >90)
Glucose, Bld: 143 mg/dL — ABNORMAL HIGH (ref 70–99)
Potassium: 5.2 mEq/L (ref 3.7–5.3)
Sodium: 141 mEq/L (ref 137–147)

## 2014-09-12 LAB — RETICULOCYTES
RBC.: 3.61 MIL/uL — ABNORMAL LOW (ref 3.87–5.11)
Retic Count, Absolute: 68.6 10*3/uL (ref 19.0–186.0)
Retic Ct Pct: 1.9 % (ref 0.4–3.1)

## 2014-09-12 LAB — CBC
HCT: 32.3 % — ABNORMAL LOW (ref 36.0–46.0)
Hemoglobin: 10.9 g/dL — ABNORMAL LOW (ref 12.0–15.0)
MCH: 31.4 pg (ref 26.0–34.0)
MCHC: 33.7 g/dL (ref 30.0–36.0)
MCV: 93.1 fL (ref 78.0–100.0)
Platelets: 179 10*3/uL (ref 150–400)
RBC: 3.47 MIL/uL — ABNORMAL LOW (ref 3.87–5.11)
RDW: 14.9 % (ref 11.5–15.5)
WBC: 5 10*3/uL (ref 4.0–10.5)

## 2014-09-12 LAB — GLUCOSE, CAPILLARY
Glucose-Capillary: 164 mg/dL — ABNORMAL HIGH (ref 70–99)
Glucose-Capillary: 113 mg/dL — ABNORMAL HIGH (ref 70–99)
Glucose-Capillary: 153 mg/dL — ABNORMAL HIGH (ref 70–99)
Glucose-Capillary: 121 mg/dL — ABNORMAL HIGH (ref 70–99)

## 2014-09-12 NOTE — Plan of Care (Signed)
Problem: Phase I Progression Outcomes Goal: Initial discharge plan identified Outcome: Completed/Met Date Met:  09/12/14 Goal: Hemodynamically stable Outcome: Completed/Met Date Met:  09/12/14 Goal: Pt./family verbalizes plan of care Outcome: Completed/Met Date Met:  09/12/14  Problem: Phase II Progression Outcomes Goal: Dyspnea controlled at rest Outcome: Not Applicable Date Met:  38/18/40 Goal: Dialysis initiated Outcome: Not Applicable Date Met:  37/54/36 Goal: Tolerating diet Outcome: Completed/Met Date Met:  09/12/14 Goal: Hemodynamically stable Outcome: Completed/Met Date Met:  09/12/14

## 2014-09-12 NOTE — Plan of Care (Signed)
Problem: Phase I Progression Outcomes Goal: Pain controlled with appropriate interventions Outcome: Completed/Met Date Met:  22-Feb-2014

## 2014-09-12 NOTE — Plan of Care (Signed)
Problem: Phase I Progression Outcomes Goal: OOB as tolerated unless otherwise ordered Outcome: Completed/Met Date Met:  09/12/14

## 2014-09-12 NOTE — Progress Notes (Addendum)
[facility] PROGRESS NOTE  [name] YIR:[id number] DOB: 11-11-1965 DOA: 09/11/2014 PCP: Tula Nakayama, MD  Assessment/Plan: 1. AKI, superimposed on chronic kidney disease stage III. Multifactorial, likely related to dehydration, ACE inhibitor and nonsteroidal anti-inflammatories. Patient has been started on intravenous fluid with some improvement. Nephrology consultation appreciated. We'll continue with current treatments. 2. Hyperkalemia. Lotensin currently on hold. Serum potassium is improving. 3. Hypertension. Blood pressure was initially low on admission. Continue to hold blood pressure medications with the exception of metoprolol. 4. Type 2 diabetes. Not on any medications. A1c is 7.1. 5. Polysubstance abuse. Urine drug screen was negative.  Code Status: full code Family Communication: discussed with patient Disposition Plan: discharge home once improved   Consultants:  Nephrology  Procedures:    Antibiotics:    HPI/Subjective: Feeling better, no new complaints  Objective: Filed Vitals:   09/12/14 1456  BP: 99/71  Pulse: 71  Temp: 97.6 F (36.4 C)  Resp: 20    Intake/Output Summary (Last 24 hours) at 09/12/14 2045 Last data filed at 09/12/14 1853  Gross per 24 hour  Intake 3181.67 ml  Output   1875 ml  Net 1306.67 ml   Filed Weights   09/11/14 1055 09/11/14 1700  Weight: 77.111 kg (170 lb) 80.3 kg (177 lb 0.5 oz)    Exam:   General:  NAD  Cardiovascular: S1, S2 RRR  Respiratory: CTA B  Abdomen: soft, nt, nd, bs+  Musculoskeletal:  No edema b/l   Data Reviewed: Basic Metabolic Panel:  Recent Labs Lab 09/10/14 1606 09/11/14 1152 09/11/14 1158 09/11/14 2025 09/12/14 0631  NA 137 138 139 140 141  K 6.5* 5.6* 6.1* 5.1 5.2  CL 108 111 109 107 110  CO2 20  --  18* 20 19  GLUCOSE 96 142* 122* 131* 143*  BUN 52* 58* 59* 53* 50*  CREATININE 3.08* 3.40* 3.03* 2.78* 2.44*  CALCIUM 9.7  --  9.3 9.1 8.9   Liver Function  Tests:  Recent Labs Lab 09/10/14 1606 09/11/14 1315  AST 17 11  ALT 19 15  ALKPHOS 75 78  BILITOT 0.5 0.3  PROT 7.2 6.8  ALBUMIN 4.3 3.5   No results for input(s): LIPASE, AMYLASE in the last 168 hours. No results for input(s): AMMONIA in the last 168 hours. CBC:  Recent Labs Lab 09/10/14 1606 09/11/14 1152 09/11/14 1158 09/12/14 0631  WBC 8.6  --  6.3 5.0  NEUTROABS  --   --  3.9  --   HGB 13.0 11.9* 11.2* 10.9*  HCT 40.0 35.0* 35.5* 32.3*  MCV 93.7  --  95.4 93.1  PLT 251  --  122* 179   Cardiac Enzymes: No results for input(s): CKTOTAL, CKMB, CKMBINDEX, TROPONINI in the last 168 hours. BNP (last 3 results)  Recent Labs  11/17/13 1354  PROBNP 506.2*   CBG:  Recent Labs Lab 09/11/14 1704 09/11/14 2116 09/12/14 0735 09/12/14 1140 09/12/14 1615  GLUCAP 168* 147* 164* 113* 153*    No results found for this or any previous visit (from the past 240 hour(s)).   Studies: US Renal  09/11/2014   CLINICAL DATA:  Acute kidney injury.  EXAM: RENAL/URINARY TRACT ULTRASOUND COMPLETE  COMPARISON:  Abdominal MRA 05/21/2005  FINDINGS: Examination at mildly limited by patient body habitus.  Right Kidney:  Length: 11.4 cm. Echogenicity within normal limits. No mass or hydronephrosis visualized.  Left Kidney:  Length: 10.9 cm. Echogenicity within normal limits. No mass visualized. Renal pelvis was minimally prominent without true  hydronephrosis.  Bladder:  Appears normal for degree of bladder distention. Ureteral jets not visualized. Patient had recently voided.  IMPRESSION: Minimal prominence of the left renal pelvis without hydronephrosis.   Electronically Signed   By: Logan Bores   On: 09/11/2014 19:44   Dg Chest Portable 1 View  09/11/2014   CLINICAL DATA:  Cough, congestion, shortness of breath for 2 weeks  EXAM: PORTABLE CHEST - 1 VIEW  COMPARISON:  11/17/2013  FINDINGS: Cardiomediastinal silhouette is stable. No acute infiltrate or pleural effusion. No pulmonary  edema. Mild degenerative changes lower thoracic spine.  IMPRESSION: No active disease.   Electronically Signed   By: Lahoma Crocker M.D.   On: 09/11/2014 12:26    Scheduled Meds: . clopidogrel  75 mg Oral Q breakfast  . FLUoxetine  20 mg Oral Daily  . gabapentin  300 mg Oral BID  . heparin  5,000 Units Subcutaneous 3 times per day  . insulin aspart  0-9 Units Subcutaneous TID WC  . metoprolol tartrate  12.5 mg Oral BID  . nicotine  21 mg Transdermal Daily  . pantoprazole  40 mg Oral Daily  . rosuvastatin  40 mg Oral Daily  . sodium chloride  3 mL Intravenous Q12H  . sodium polystyrene  30 g Oral Once  . traZODone  50 mg Oral QHS,MR X 1   Continuous Infusions: . sodium chloride 100 mL/hr at 09/12/14 2006    Principal Problem:   Acute renal failure superimposed on stage 3 chronic kidney disease Active Problems:   Essential hypertension   Hyperglycemia   DM (diabetes mellitus)   CKD (chronic kidney disease) stage 3, GFR 30-59 ml/min   Polysubstance abuse   Hyperkalemia   Hypotension   Metabolic acidosis   Thrombocytopenia   Acute-on-chronic kidney injury    Time spent: 41mins    MEMON,JEHANZEB  [facility] Pager [phone]. If 7PM-7AM, please contact night-coverage at [email or web address], password [facility] 09/12/2014, 8:45 PM  LOS: 1 day

## 2014-09-12 NOTE — Progress Notes (Signed)
Patient asked if there was any way she could get a scale for home upon discharge.  RN provided patient with one.  Patient also asked if we could provide her with a glucometer, as her's broke before she came in.  RN informed the patient that the hospital does not provide those but there is an inexpensive one she can purchase at [employer].

## 2014-09-12 NOTE — Progress Notes (Signed)
UR chart review completed.  

## 2014-09-12 NOTE — Consult Note (Addendum)
[name] MRN: [id number] DOB/AGE: Dec 21, 1965 48 y.o. Primary [place], MD Admit date: 09/11/2014 Chief Complaint:  Chief Complaint  Patient presents with  . Abnormal Lab   HPI: Pt is 48 years old female with past medical hx f DM who was sent to ER with c/o abnormal labs.  HPI dates back to 09/10/14 when pt had her labs and Primary care office called her to go to ER At the time of admission pt c/o having diarrhea for the past 3 days , having had 2-3 episodes of loose diarrhea. Pt says she is better now. NO c/o Fevers/ Cough/ Chills NO c/o  Malaise NO c/o  Headache NO c/o  Dizziness NO c/o  Nausea/ vomiting. NO c/o chest pain/ palpitations. NO c/o shortness of breath.  NO c/o abdominal pain, Pt gives hx taking ibuprofen off and on for pain .   Past Medical History  Diagnosis Date  . Alcohol use     Now abstinent.  . Diabetes mellitus, type 2   . Obesity   . Hyperlipidemia   . Hypertension   . Anxiety   . Panic attacks   . Hypercholesteremia   . Diabetic Charcot foot 12/30/2011  . RECTAL BLEEDING, HX OF 12/05/2009    Qualifier: Diagnosis of  By: Zeb Comfort    . Ankle fracture, lateral malleolus, closed 12/30/2011  . Breast mass, left 12/15/2011  . Tobacco abuse   . ANXIETY 04/17/2010    Qualifier: Diagnosis of  By: Claybon Jabs PA, Dawn    . Hypertensive cardiomyopathy 11/08/2012    Ejection fraction 40-45%.  . Diastolic dysfunction 02/01/8118    Grade 1.  . CKD (chronic kidney disease)   . Noncompliance with medication regimen         Family History  Problem Relation Age of Onset  . Hypertension Mother   . Hypertension Father     CABG   . Hypertension Sister   . Heart attack Mother   . Arthritis    . Cancer    . Diabetes    . Asthma      Social History:  reports that she has been smoking Cigarettes.  She has a 15 pack-year smoking history. She has never used smokeless tobacco. She reports that she drinks alcohol. She reports that  she uses illicit drugs ("Crack" cocaine).   Allergies: No Known Allergies  Medications Prior to Admission  Medication Sig Dispense Refill  . acetaminophen (TYLENOL) 500 MG tablet Take 1 tablet (500 mg total) by mouth daily. 30 tablet 4  . amLODipine (NORVASC) 10 MG tablet Take 1 tablet (10 mg total) by mouth daily. 30 tablet 5  . benazepril (LOTENSIN) 40 MG tablet TAKE ONE TABLET BY MOUTH DAILY. 30 tablet 3  . clopidogrel (PLAVIX) 75 MG tablet Take 1 tablet (75 mg total) by mouth daily with breakfast. 30 tablet 3  . FLUoxetine (PROZAC) 20 MG capsule Take 1 capsule (20 mg total) by mouth daily. 30 capsule 3  . furosemide (LASIX) 40 MG tablet Take 1 tablet (40 mg total) by mouth daily. 30 tablet 3  . gabapentin (NEURONTIN) 300 MG capsule Take 1 capsule (300 mg total) by mouth 2 (two) times daily. 60 capsule 3  . hydrOXYzine (ATARAX/VISTARIL) 25 MG tablet Take 1 tablet (25 mg total) by mouth every 4 (four) hours as needed for anxiety (Sleep). 30 tablet 0  . ibuprofen (ADVIL,MOTRIN) 800 MG tablet Take 800 mg by mouth every 8 (eight) hours as needed (pain).    Marland Kitchen  metoprolol tartrate (LOPRESSOR) 25 MG tablet Take 0.5 tablets (12.5 mg total) by mouth 2 (two) times daily. 30 tablet 3  . pantoprazole (PROTONIX) 40 MG tablet Take 1 tablet (40 mg total) by mouth daily. 30 tablet 3  . potassium chloride SA (K-DUR,KLOR-CON) 20 MEQ tablet Take 30 mEq by mouth daily.    . rosuvastatin (CRESTOR) 40 MG tablet Take 1 tablet (40 mg total) by mouth daily. 30 tablet 3  . traMADol (ULTRAM) 50 MG tablet TAKE 1 TABLET BY MOUTH TWICE DAILY. 60 tablet 1  . traZODone (DESYREL) 50 MG tablet Take 1 tablet (50 mg total) by mouth at bedtime and may repeat dose one time if needed. 30 tablet 3  . triamterene-hydrochlorothiazide (MAXZIDE-25) 37.5-25 MG per tablet Take 1 tablet by mouth daily.    Marland Kitchen ibuprofen (ADVIL,MOTRIN) 800 MG tablet TAKE (1) TABLET BY MOUTH EVERY EIGHT HOURS AS NEEDED. (Patient not taking: Reported on  09/11/2014) 21 tablet 1  . insulin aspart (NOVOLOG FLEXPEN) 100 UNIT/ML FlexPen Inject 4 Units into the skin 3 (three) times daily with meals. (Patient not taking: Reported on 09/11/2014) 15 mL 3  . nicotine (NICODERM CQ - DOSED IN MG/24 HOURS) 21 mg/24hr patch Place 1 patch (21 mg total) onto the skin daily. (Patient not taking: Reported on 09/11/2014) 28 patch 0  . potassium chloride SA (K-DUR,KLOR-CON) 20 MEQ tablet TAKE 1&1/2 TABLETS BY MOUTH EVERY DAY. (Patient not taking: Reported on 09/11/2014) 45 tablet 3       QVZ:DGLOV from the symptoms mentioned above,there are no other symptoms referable to all systems reviewed.  . clopidogrel  75 mg Oral Q breakfast  . FLUoxetine  20 mg Oral Daily  . gabapentin  300 mg Oral BID  . heparin  5,000 Units Subcutaneous 3 times per day  . insulin aspart  0-9 Units Subcutaneous TID WC  . metoprolol tartrate  12.5 mg Oral BID  . nicotine  21 mg Transdermal Daily  . pantoprazole  40 mg Oral Daily  . rosuvastatin  40 mg Oral Daily  . sodium chloride  3 mL Intravenous Q12H  . sodium polystyrene  30 g Oral Once  . traZODone  50 mg Oral QHS,MR X 1       Physical Exam: Vital signs in last 24 hours: Temp:  [97.6 F (36.4 C)-98 F (36.7 C)] 97.6 F (36.4 C) (11/18 1456) Pulse Rate:  [71-92] 71 (11/18 1456) Resp:  [14-20] 20 (11/18 1456) BP: (92-105)/(70-81) 99/71 mmHg (11/18 1456) SpO2:  [100 %] 100 % (11/18 1456) Weight:  [177 lb 0.5 oz (80.3 kg)] 177 lb 0.5 oz (80.3 kg) (11/17 1700) Weight change:  Last BM Date: 09/10/14  Intake/Output from previous day: 11/17 0701 - 11/18 0700 In: 1135 [I.V.:1135] Out: 1300 [Urine:1300] Total I/O In: 480 [P.O.:480] Out: 250 [Urine:250]   Physical Exam: General- pt is awake,alert, oriented to time place and person Resp- No acute REsp distress, CTA B/L NO Rhonchi CVS- S1S2 regular in rate and rhythm GIT- BS+, soft, NT, ND EXT- NO LE Edema, Cyanosis CNS- CN 2-12 grossly intact. Moving all 4  extremities Psych- normal mood and affect    Lab Results: CBC  Recent Labs  09/11/14 1158 09/12/14 0631  WBC 6.3 5.0  HGB 11.2* 10.9*  HCT 35.5* 32.3*  PLT 122* 179    BMET  Recent Labs  09/11/14 2025 09/12/14 0631  NA 140 141  K 5.1 5.2  CL 107 110  CO2 20 19  GLUCOSE 131* 143*  BUN 53* 50*  CREATININE 2.78* 2.44*  CALCIUM 9.1 8.9    Creat  2015  3.4=>2.44          1.1--1.4 ( baseline)  2014  0.8--1.6 2012  3.0 ( AKi)   MICRO No results found for this or any previous visit (from the past 240 hour(s)).    Lab Results  Component Value Date   CALCIUM 8.9 09/12/2014   CAION 1.25* 09/11/2014      Impression: 1)Renal  AKI secondary to hypotension / NSAIDS/diuretics/ACE               AKi sec to prerenal/ATN                AKi now better               CKD stage 3 .               CKD since 2014               CKD secondary to Multiple factors                             HTN                             DM                             Cocaine abuse                              Multi[ple AKI                Progression of CKD marked with multiple AKI                Proteinura  Present             2)HTN BP at goal  3)Anemia HGb at goal (9--11)   4)CKD Mineral-Bone Disorder PTH not avail  Secondary Hyperparathyroidism w/u pending  Phosphorus and Vitamin 25-OH will check.  5)Substabnce abuse Hx of cocine in past.  6)Electrolytes  Normokalemic    Was hyperkalemic earlier NOrmonatremic   7)Acid base Co2 at goal     Plan:  Will initiate CKD workup Will initiate ptoteinuria work up Will ask for renal u/s Will ask for CKD-BMd work up [place] with holding diuretics and ace Pt will need ACe later as DM + Proteinuria + HTN       Delrose Rohwer S 09/12/2014, 4:14 PM

## 2014-09-12 NOTE — Plan of Care (Signed)
Problem: Phase I Progression Outcomes Goal: Dyspnea controlled at rest Outcome: Completed/Met Date Met:  09/12/14

## 2014-09-13 DIAGNOSIS — I952 Hypotension due to drugs: Secondary | ICD-10-CM

## 2014-09-13 DIAGNOSIS — E872 Acidosis: Secondary | ICD-10-CM

## 2014-09-13 LAB — ANTI-DNA ANTIBODY, DOUBLE-STRANDED: ds DNA Ab: <1 IU/mL

## 2014-09-13 LAB — GLUCOSE, CAPILLARY
Glucose-Capillary: 144 mg/dL — ABNORMAL HIGH (ref 70–99)
Glucose-Capillary: 169 mg/dL — ABNORMAL HIGH (ref 70–99)
Glucose-Capillary: 116 mg/dL — ABNORMAL HIGH (ref 70–99)

## 2014-09-13 LAB — ALBUMIN: Albumin: 3.3 g/dL — ABNORMAL LOW (ref 3.5–5.2)

## 2014-09-13 LAB — HEPATITIS PANEL, ACUTE
HCV Ab: NEGATIVE
Hep A IgM: NONREACTIVE
Hep B C IgM: NONREACTIVE
Hepatitis B Surface Ag: NEGATIVE

## 2014-09-13 LAB — BASIC METABOLIC PANEL
Anion gap: 12 (ref 5–15)
BUN: 38 mg/dL — ABNORMAL HIGH (ref 6–23)
CO2: 21 mEq/L (ref 19–32)
Calcium: 9 mg/dL (ref 8.4–10.5)
Chloride: 110 mEq/L (ref 96–112)
Creatinine, Ser: 1.91 mg/dL — ABNORMAL HIGH (ref 0.50–1.10)
GFR calc Af Amer: 34 mL/min — ABNORMAL LOW (ref >90)
GFR calc non Af Amer: 30 mL/min — ABNORMAL LOW (ref >90)
Glucose, Bld: 150 mg/dL — ABNORMAL HIGH (ref 70–99)
Potassium: 4.5 mEq/L (ref 3.7–5.3)
Sodium: 143 mEq/L (ref 137–147)

## 2014-09-13 LAB — ANA: Anti Nuclear Antibody(ANA): NEGATIVE

## 2014-09-13 LAB — FERRITIN: Ferritin: 451 ng/mL — ABNORMAL HIGH (ref 10–291)

## 2014-09-13 LAB — IRON AND TIBC
Iron: 128 ug/dL (ref 42–135)
Saturation Ratios: 44 % (ref 20–55)
TIBC: 292 ug/dL (ref 250–470)
UIBC: 164 ug/dL (ref 125–400)

## 2014-09-13 LAB — MPO/PR-3 (ANCA) ANTIBODIES
Myeloperoxidase Abs: <1
Serine Protease 3: <1

## 2014-09-13 LAB — FOLATE: Folate: 11.7 ng/mL

## 2014-09-13 LAB — C3 COMPLEMENT: C3 Complement: 132 mg/dL (ref 90–180)

## 2014-09-13 LAB — ANCA SCREEN W REFLEX TITER
Atypical p-ANCA Screen: NEGATIVE
c-ANCA Screen: NEGATIVE
p-ANCA Screen: NEGATIVE

## 2014-09-13 LAB — C4 COMPLEMENT: Complement C4, Body Fluid: 36 mg/dL (ref 10–40)

## 2014-09-13 LAB — PHOSPHORUS: Phosphorus: 3.1 mg/dL (ref 2.3–4.6)

## 2014-09-13 LAB — VITAMIN D 25 HYDROXY (VIT D DEFICIENCY, FRACTURES): Vit D, 25-Hydroxy: <10 ng/mL — ABNORMAL LOW (ref 30–100)

## 2014-09-13 LAB — VITAMIN B12: Vitamin B-12: 515 pg/mL (ref 211–911)

## 2014-09-13 MED ORDER — HYDROCORTISONE 2.5 % RE CREA: 1.0000 "application " | TOPICAL_CREAM | Freq: Two times a day (BID) | RECTAL | Status: DC

## 2014-09-13 MED ORDER — DOCUSATE SODIUM 100 MG PO CAPS: 100.0000 mg | ORAL_CAPSULE | Freq: Two times a day (BID) | ORAL | Status: DC

## 2014-09-13 NOTE — Progress Notes (Deleted)
Physician Discharge Summary  [name] YJE:[id number] DOB: 07/03/1966 DOA: 09/11/2014  PCP: Tula Nakayama, MD  Admit date: 09/11/2014 Discharge date: 09/13/2014  Time spent: 40 minutes  Recommendations for Outpatient Follow-up:  1. Follow up with neurology and check repeat BMET on follow up 2. Lasix, maxzide and benazepril have been held until follows up with nephrology  Discharge Diagnoses:  Principal Problem:   Acute renal failure superimposed on stage 3 chronic kidney disease Active Problems:   Essential hypertension   Hyperglycemia   DM (diabetes mellitus)   CKD (chronic kidney disease) stage 3, GFR 30-59 ml/min   Polysubstance abuse   Hyperkalemia   Hypotension   Metabolic acidosis   Thrombocytopenia   Acute-on-chronic kidney injury   Discharge Condition: improved  Diet recommendation: low salt, low carb  Filed Weights   09/11/14 1055 09/11/14 1700  Weight: 77.111 kg (170 lb) 80.3 kg (177 lb 0.5 oz)    History of present illness:  Patient contact her primary care physician for a routine visit or blood work indicated elevated potassium of 6.5 and acute on chronic kidney disease with creatinine of 3.08. She was referred to the emergency room for evaluation.  Hospital Course:  On arrival to the emergency room, she was noted to be hypotensive with a blood pressure 70s. The patient was aggressively hydrated with IV fluids. She did not have any signs of infection or sepsis. She was on multiple diuretics including Lasix and Maxide as well as benazepril. She was also taking nonsteroidal anti-inflammatories for her arthritis pain. She was seen by nephrology and continued on IV fluids. Her creatinine appears to have improved. Currently her creatinine is 1.9 and is continuing to improve on a daily basis. She's been cleared for discharge by nephrology to follow up with an outpatient. Her Lasix/Maxzide/benazepril will be held until she follows up with nephrology and has  repeat basic metabolic panel drawn. She's been advised to continue with hydration for now.  She did report some blood in her stools and has a history of hemorrhoids. Her hemoglobin during her hospital stays and stable. She was prescribed Anusol cream as well as Colace and can be further followed in the outpatient setting.  Procedures:    Consultations:  Nephrology  Discharge Exam: Filed Vitals:   09/13/14 1448  BP: 121/85  Pulse: 93  Temp: 98.5 F (36.9 C)  Resp: 20    General: NAD Cardiovascular: S1, S2 RRR Respiratory: CTA B  Discharge Instructions You were cared for by a hospitalist during your hospital stay. If you have any questions about your discharge medications or the care you received while you were in the hospital after you are discharged, you can call the unit and asked to speak with the hospitalist on call if the hospitalist that took care of you is not available. Once you are discharged, your primary care physician will handle any further medical issues. Please note that NO REFILLS for any discharge medications will be authorized once you are discharged, as it is imperative that you return to your primary care physician (or establish a relationship with a primary care physician if you do not have one) for your aftercare needs so that they can reassess your need for medications and monitor your lab values.  Discharge Instructions    Diet - low sodium heart healthy    Complete by:  As directed      Diet Carb Modified    Complete by:  As directed  Increase activity slowly    Complete by:  As directed           Current Discharge Medication List    START taking these medications   Details  docusate sodium (COLACE) 100 MG capsule Take 1 capsule (100 mg total) by mouth 2 (two) times daily. Qty: 30 capsule, Refills: 0    hydrocortisone (ANUSOL-HC) 2.5 % rectal cream Place 1 application rectally 2 (two) times daily. Qty: 30 g, Refills: 0      CONTINUE these  medications which have NOT CHANGED   Details  acetaminophen (TYLENOL) 500 MG tablet Take 1 tablet (500 mg total) by mouth daily. Qty: 30 tablet, Refills: 4    amLODipine (NORVASC) 10 MG tablet Take 1 tablet (10 mg total) by mouth daily. Qty: 30 tablet, Refills: 5   Associated Diagnoses: Unspecified essential hypertension    clopidogrel (PLAVIX) 75 MG tablet Take 1 tablet (75 mg total) by mouth daily with breakfast. Qty: 30 tablet, Refills: 3    FLUoxetine (PROZAC) 20 MG capsule Take 1 capsule (20 mg total) by mouth daily. Qty: 30 capsule, Refills: 3    gabapentin (NEURONTIN) 300 MG capsule Take 1 capsule (300 mg total) by mouth 2 (two) times daily. Qty: 60 capsule, Refills: 3    hydrOXYzine (ATARAX/VISTARIL) 25 MG tablet Take 1 tablet (25 mg total) by mouth every 4 (four) hours as needed for anxiety (Sleep). Qty: 30 tablet, Refills: 0    metoprolol tartrate (LOPRESSOR) 25 MG tablet Take 0.5 tablets (12.5 mg total) by mouth 2 (two) times daily. Qty: 30 tablet, Refills: 3    pantoprazole (PROTONIX) 40 MG tablet Take 1 tablet (40 mg total) by mouth daily. Qty: 30 tablet, Refills: 3    rosuvastatin (CRESTOR) 40 MG tablet Take 1 tablet (40 mg total) by mouth daily. Qty: 30 tablet, Refills: 3    traMADol (ULTRAM) 50 MG tablet TAKE 1 TABLET BY MOUTH TWICE DAILY. Qty: 60 tablet, Refills: 1    traZODone (DESYREL) 50 MG tablet Take 1 tablet (50 mg total) by mouth at bedtime and may repeat dose one time if needed. Qty: 30 tablet, Refills: 3    insulin aspart (NOVOLOG FLEXPEN) 100 UNIT/ML FlexPen Inject 4 Units into the skin 3 (three) times daily with meals. Qty: 15 mL, Refills: 3    nicotine (NICODERM CQ - DOSED IN MG/24 HOURS) 21 mg/24hr patch Place 1 patch (21 mg total) onto the skin daily. Qty: 28 patch, Refills: 0      STOP taking these medications     benazepril (LOTENSIN) 40 MG tablet      furosemide (LASIX) 40 MG tablet      potassium chloride SA (K-DUR,KLOR-CON) 20 MEQ  tablet      triamterene-hydrochlorothiazide (MAXZIDE-25) 37.5-25 MG per tablet        No Known Allergies Follow-up Information    Follow up with [facility] S, MD In 4 weeks.   Specialty:  Nephrology   Contact information:   [address] [place] [address] [phone]        The results of significant diagnostics from this hospitalization (including imaging, microbiology, ancillary and laboratory) are listed below for reference.    Significant Diagnostic Studies: US Renal  09/11/2014   CLINICAL DATA:  Acute kidney injury.  EXAM: RENAL/URINARY TRACT ULTRASOUND COMPLETE  COMPARISON:  Abdominal MRA 05/21/2005  FINDINGS: Examination at mildly limited by patient body habitus.  Right Kidney:  Length: 11.4 cm. Echogenicity within normal limits. No mass or hydronephrosis  visualized.  Left Kidney:  Length: 10.9 cm. Echogenicity within normal limits. No mass visualized. Renal pelvis was minimally prominent without true hydronephrosis.  Bladder:  Appears normal for degree of bladder distention. Ureteral jets not visualized. Patient had recently voided.  IMPRESSION: Minimal prominence of the left renal pelvis without hydronephrosis.   Electronically Signed   By: Logan Bores   On: 09/11/2014 19:44   Dg Chest Portable 1 View  09/11/2014   CLINICAL DATA:  Cough, congestion, shortness of breath for 2 weeks  EXAM: PORTABLE CHEST - 1 VIEW  COMPARISON:  11/17/2013  FINDINGS: Cardiomediastinal silhouette is stable. No acute infiltrate or pleural effusion. No pulmonary edema. Mild degenerative changes lower thoracic spine.  IMPRESSION: No active disease.   Electronically Signed   By: Lahoma Crocker M.D.   On: 09/11/2014 12:26    Microbiology: No results found for this or any previous visit (from the past 240 hour(s)).   Labs: Basic Metabolic Panel:  Recent Labs Lab 09/10/14 1606 09/11/14 1152 09/11/14 1158 09/11/14 2025 09/12/14 0631 09/13/14 0703  NA 137 138 139 140 141 143   K 6.5* 5.6* 6.1* 5.1 5.2 4.5  CL 108 111 109 107 110 110  CO2 20  --  18* 20 19 21   GLUCOSE 96 142* 122* 131* 143* 150*  BUN 52* 58* 59* 53* 50* 38*  CREATININE 3.08* 3.40* 3.03* 2.78* 2.44* 1.91*  CALCIUM 9.7  --  9.3 9.1 8.9 9.0  PHOS  --   --   --   --   --  3.1   Liver Function Tests:  Recent Labs Lab 09/10/14 1606 09/11/14 1315 09/13/14 0705  AST 17 11  --   ALT 19 15  --   ALKPHOS 75 78  --   BILITOT 0.5 0.3  --   PROT 7.2 6.8  --   ALBUMIN 4.3 3.5 3.3*   No results for input(s): LIPASE, AMYLASE in the last 168 hours. No results for input(s): AMMONIA in the last 168 hours. CBC:  Recent Labs Lab 09/10/14 1606 09/11/14 1152 09/11/14 1158 09/12/14 0631  WBC 8.6  --  6.3 5.0  NEUTROABS  --   --  3.9  --   HGB 13.0 11.9* 11.2* 10.9*  HCT 40.0 35.0* 35.5* 32.3*  MCV 93.7  --  95.4 93.1  PLT 251  --  122* 179   Cardiac Enzymes: No results for input(s): CKTOTAL, CKMB, CKMBINDEX, TROPONINI in the last 168 hours. BNP: BNP (last 3 results)  Recent Labs  11/17/13 1354  PROBNP 506.2*   CBG:  Recent Labs Lab 09/12/14 1140 09/12/14 1615 09/12/14 2113 09/13/14 0723 09/13/14 1113  GLUCAP 113* 153* 121* 144* 169*       Signed:  Treylon Henard  [facility] 09/13/2014, 4:06 PM

## 2014-09-13 NOTE — Progress Notes (Signed)
Patient was given discharge instructions and care notes.  Patient verbalized understanding with no complaints or concerns voiced at this time.  IV was removed with catheter intact, no bleeding or complications.  Patient left unit in stable condition by staff member in wheelchair with family.

## 2014-09-13 NOTE — Discharge Summary (Signed)
Physician Discharge Summary  [name] BVA:[id number] DOB: 1966/05/31 DOA: 09/11/2014  PCP: Tula Nakayama, MD  Admit date: 09/11/2014 Discharge date: 09/13/2014  Time spent: 40 minutes  Recommendations for Outpatient Follow-up:  1. Follow up with neurology and check repeat BMET on follow up 2. Lasix, maxzide and benazepril have been held until follows up with nephrology  Discharge Diagnoses:  Principal Problem:   Acute renal failure superimposed on stage 3 chronic kidney disease Active Problems:   Essential hypertension   Hyperglycemia   DM (diabetes mellitus)   CKD (chronic kidney disease) stage 3, GFR 30-59 ml/min   Polysubstance abuse   Hyperkalemia   Hypotension   Metabolic acidosis   Thrombocytopenia   Acute-on-chronic kidney injury   Discharge Condition: improved  Diet recommendation: low salt, low carb  Filed Weights   09/11/14 1055 09/11/14 1700  Weight: 77.111 kg (170 lb) 80.3 kg (177 lb 0.5 oz)    History of present illness:  Patient contact her primary care physician for a routine visit or blood work indicated elevated potassium of 6.5 and acute on chronic kidney disease with creatinine of 3.08. She was referred to the emergency room for evaluation.  Hospital Course:  On arrival to the emergency room, she was noted to be hypotensive with a blood pressure 70s. The patient was aggressively hydrated with IV fluids. She did not have any signs of infection or sepsis. She was on multiple diuretics including Lasix and Maxide as well as benazepril. She was also taking nonsteroidal anti-inflammatories for her arthritis pain. She was seen by nephrology and continued on IV fluids. Her creatinine appears to have improved. Currently her creatinine is 1.9 and is continuing to improve on a daily basis. She's been cleared for discharge by nephrology to follow up with an outpatient. Her Lasix/Maxzide/benazepril will be held until she follows up with nephrology and has  repeat basic metabolic panel drawn. She's been advised to continue with hydration for now.  She did report some blood in her stools and has a history of hemorrhoids. Her hemoglobin during her hospital stays and stable. She was prescribed Anusol cream as well as Colace and can be further followed in the outpatient setting.  Procedures:    Consultations:  Nephrology  Discharge Exam: Filed Vitals:   09/13/14 1448  BP: 121/85  Pulse: 93  Temp: 98.5 F (36.9 C)  Resp: 20    General: NAD Cardiovascular: S1, S2 RRR Respiratory: CTA B  Discharge Instructions You were cared for by a hospitalist during your hospital stay. If you have any questions about your discharge medications or the care you received while you were in the hospital after you are discharged, you can call the unit and asked to speak with the hospitalist on call if the hospitalist that took care of you is not available. Once you are discharged, your primary care physician will handle any further medical issues. Please note that NO REFILLS for any discharge medications will be authorized once you are discharged, as it is imperative that you return to your primary care physician (or establish a relationship with a primary care physician if you do not have one) for your aftercare needs so that they can reassess your need for medications and monitor your lab values.  Discharge Instructions    Diet - low sodium heart healthy    Complete by:  As directed      Diet Carb Modified    Complete by:  As directed  Increase activity slowly    Complete by:  As directed           Current Discharge Medication List    START taking these medications   Details  docusate sodium (COLACE) 100 MG capsule Take 1 capsule (100 mg total) by mouth 2 (two) times daily. Qty: 30 capsule, Refills: 0    hydrocortisone (ANUSOL-HC) 2.5 % rectal cream Place 1 application rectally 2 (two) times daily. Qty: 30 g, Refills: 0      CONTINUE these  medications which have NOT CHANGED   Details  acetaminophen (TYLENOL) 500 MG tablet Take 1 tablet (500 mg total) by mouth daily. Qty: 30 tablet, Refills: 4    amLODipine (NORVASC) 10 MG tablet Take 1 tablet (10 mg total) by mouth daily. Qty: 30 tablet, Refills: 5   Associated Diagnoses: Unspecified essential hypertension    clopidogrel (PLAVIX) 75 MG tablet Take 1 tablet (75 mg total) by mouth daily with breakfast. Qty: 30 tablet, Refills: 3    FLUoxetine (PROZAC) 20 MG capsule Take 1 capsule (20 mg total) by mouth daily. Qty: 30 capsule, Refills: 3    gabapentin (NEURONTIN) 300 MG capsule Take 1 capsule (300 mg total) by mouth 2 (two) times daily. Qty: 60 capsule, Refills: 3    hydrOXYzine (ATARAX/VISTARIL) 25 MG tablet Take 1 tablet (25 mg total) by mouth every 4 (four) hours as needed for anxiety (Sleep). Qty: 30 tablet, Refills: 0    metoprolol tartrate (LOPRESSOR) 25 MG tablet Take 0.5 tablets (12.5 mg total) by mouth 2 (two) times daily. Qty: 30 tablet, Refills: 3    pantoprazole (PROTONIX) 40 MG tablet Take 1 tablet (40 mg total) by mouth daily. Qty: 30 tablet, Refills: 3    rosuvastatin (CRESTOR) 40 MG tablet Take 1 tablet (40 mg total) by mouth daily. Qty: 30 tablet, Refills: 3    traMADol (ULTRAM) 50 MG tablet TAKE 1 TABLET BY MOUTH TWICE DAILY. Qty: 60 tablet, Refills: 1    traZODone (DESYREL) 50 MG tablet Take 1 tablet (50 mg total) by mouth at bedtime and may repeat dose one time if needed. Qty: 30 tablet, Refills: 3    insulin aspart (NOVOLOG FLEXPEN) 100 UNIT/ML FlexPen Inject 4 Units into the skin 3 (three) times daily with meals. Qty: 15 mL, Refills: 3    nicotine (NICODERM CQ - DOSED IN MG/24 HOURS) 21 mg/24hr patch Place 1 patch (21 mg total) onto the skin daily. Qty: 28 patch, Refills: 0      STOP taking these medications     benazepril (LOTENSIN) 40 MG tablet      furosemide (LASIX) 40 MG tablet      potassium chloride SA (K-DUR,KLOR-CON) 20 MEQ  tablet      triamterene-hydrochlorothiazide (MAXZIDE-25) 37.5-25 MG per tablet        No Known Allergies Follow-up Information    Follow up with [facility] S, MD In 4 weeks.   Specialty:  Nephrology   Contact information:   [address] [place] [address] [phone]        The results of significant diagnostics from this hospitalization (including imaging, microbiology, ancillary and laboratory) are listed below for reference.    Significant Diagnostic Studies: US Renal  09/11/2014   CLINICAL DATA:  Acute kidney injury.  EXAM: RENAL/URINARY TRACT ULTRASOUND COMPLETE  COMPARISON:  Abdominal MRA 05/21/2005  FINDINGS: Examination at mildly limited by patient body habitus.  Right Kidney:  Length: 11.4 cm. Echogenicity within normal limits. No mass or hydronephrosis  visualized.  Left Kidney:  Length: 10.9 cm. Echogenicity within normal limits. No mass visualized. Renal pelvis was minimally prominent without true hydronephrosis.  Bladder:  Appears normal for degree of bladder distention. Ureteral jets not visualized. Patient had recently voided.  IMPRESSION: Minimal prominence of the left renal pelvis without hydronephrosis.   Electronically Signed   By: Logan Bores   On: 09/11/2014 19:44   Dg Chest Portable 1 View  09/11/2014   CLINICAL DATA:  Cough, congestion, shortness of breath for 2 weeks  EXAM: PORTABLE CHEST - 1 VIEW  COMPARISON:  11/17/2013  FINDINGS: Cardiomediastinal silhouette is stable. No acute infiltrate or pleural effusion. No pulmonary edema. Mild degenerative changes lower thoracic spine.  IMPRESSION: No active disease.   Electronically Signed   By: Lahoma Crocker M.D.   On: 09/11/2014 12:26    Microbiology: No results found for this or any previous visit (from the past 240 hour(s)).   Labs: Basic Metabolic Panel:  Recent Labs Lab 09/10/14 1606 09/11/14 1152 09/11/14 1158 09/11/14 2025 09/12/14 0631 09/13/14 0703  NA 137 138 139 140 141 143   K 6.5* 5.6* 6.1* 5.1 5.2 4.5  CL 108 111 109 107 110 110  CO2 20  --  18* 20 19 21   GLUCOSE 96 142* 122* 131* 143* 150*  BUN 52* 58* 59* 53* 50* 38*  CREATININE 3.08* 3.40* 3.03* 2.78* 2.44* 1.91*  CALCIUM 9.7  --  9.3 9.1 8.9 9.0  PHOS  --   --   --   --   --  3.1   Liver Function Tests:  Recent Labs Lab 09/10/14 1606 09/11/14 1315 09/13/14 0705  AST 17 11  --   ALT 19 15  --   ALKPHOS 75 78  --   BILITOT 0.5 0.3  --   PROT 7.2 6.8  --   ALBUMIN 4.3 3.5 3.3*   No results for input(s): LIPASE, AMYLASE in the last 168 hours. No results for input(s): AMMONIA in the last 168 hours. CBC:  Recent Labs Lab 09/10/14 1606 09/11/14 1152 09/11/14 1158 09/12/14 0631  WBC 8.6  --  6.3 5.0  NEUTROABS  --   --  3.9  --   HGB 13.0 11.9* 11.2* 10.9*  HCT 40.0 35.0* 35.5* 32.3*  MCV 93.7  --  95.4 93.1  PLT 251  --  122* 179   Cardiac Enzymes: No results for input(s): CKTOTAL, CKMB, CKMBINDEX, TROPONINI in the last 168 hours. BNP: BNP (last 3 results)  Recent Labs  11/17/13 1354  PROBNP 506.2*   CBG:  Recent Labs Lab 09/12/14 1140 09/12/14 1615 09/12/14 2113 09/13/14 0723 09/13/14 1113  GLUCAP 113* 153* 121* 144* 169*       Signed:  Rosalee Tolley  [facility] 09/13/2014, 4:11 PM

## 2014-09-13 NOTE — Care Management Note (Signed)
    Page 1 of 2   09/13/2014     5:01:22 PM CARE MANAGEMENT NOTE 09/13/2014  Patient:  [name]   Account Number:  [id number]  Date Initiated:  09/13/2014  Documentation initiated by:  Theophilus Kinds  Subjective/Objective Assessment:   Pt admitted from home with AKI. Pt lives alone in the [facility] in [place] and will return there at discharge. Pts daughter was her CAP aide until she moved to [place] and her CAP services has not been transfered to [place] yet     Action/Plan:   Pt stated that she receives $52 food stamps but that is not enough food to get through the month. Pt also asked about glucometer and scales. Pt given scales. Informed pt where to get glucometer ([employer]). CM called [facility] RN   Anticipated DC Date:  09/14/2014   Anticipated DC Plan:  [place]  CM consult      [facility] Choice  HOME HEALTH   Choice offered to / List presented to:  C-1 Patient        [place] arranged  [place] RN      [place].   Status of service:  Completed, signed off Medicare Important Message given?   (If response is "NO", the following Medicare IM given date fields will be blank) Date Medicare IM given:   Medicare IM given by:   Date Additional Medicare IM given:   Additional Medicare IM given by:    Discharge Disposition:  [place]  Per UR Regulation:    If discussed at Long Length of Stay Meetings, dates discussed:    Comments:  09/13/14 [place], RN BSN CM Pt discharged home today with [facility] RN and aide. Romualdo Bolk of [facility] is aware and will collect pts information from the chart. [place] services to start within 48 hours of discharge. No DME needs noted. Pt and pts nurse aware of discharge arrangements.  09/13/14 [place], RN BSN CM in regards to other community resources available to pt. Message left. Pt would like [place]  aide to come at discharge. Will continue to follow for discharge planning needs.

## 2014-09-13 NOTE — Progress Notes (Signed)
Subjective: Interval History: has complaints of diarrhea. Patient denies any nausea  andvomiting. Presently she says she is feeling much better today..  Objective: Vital signs in last 24 hours: Temp:  [97.6 F (36.4 C)-98.5 F (36.9 C)] 98.4 F (36.9 C) (11/19 0710) Pulse Rate:  [71-79] 79 (11/19 0710) Resp:  [20] 20 (11/19 0710) BP: (99-115)/(71-85) 115/85 mmHg (11/19 0710) SpO2:  [100 %] 100 % (11/19 0710) Weight change:   Intake/Output from previous day: 11/18 0701 - 11/19 0700 In: 3246.7 [P.O.:720; I.V.:2526.7] Out: 575 [Urine:575] Intake/Output this shift:    General appearance: alert, cooperative and no distress Resp: clear to auscultation bilaterally Cardio: regular rate and rhythm, S1, S2 normal, no murmur, click, rub or gallop GI: soft, non-tender; bowel sounds normal; no masses,  no organomegaly Extremities: extremities normal, atraumatic, no cyanosis or edema  Lab Results:  Recent Labs  09/11/14 1158 09/12/14 0631  WBC 6.3 5.0  HGB 11.2* 10.9*  HCT 35.5* 32.3*  PLT 122* 179   BMET:  Recent Labs  09/12/14 0631 09/13/14 0703  NA 141 143  K 5.2 4.5  CL 110 110  CO2 19 21  GLUCOSE 143* 150*  BUN 50* 38*  CREATININE 2.44* 1.91*  CALCIUM 8.9 9.0   No results for input(s): PTH in the last 72 hours. Iron Studies:  Recent Labs  09/12/14 1726  IRON 128  TIBC 292  FERRITIN 451*    Studies/Results: US Renal  09/11/2014   CLINICAL DATA:  Acute kidney injury.  EXAM: RENAL/URINARY TRACT ULTRASOUND COMPLETE  COMPARISON:  Abdominal MRA 05/21/2005  FINDINGS: Examination at mildly limited by patient body habitus.  Right Kidney:  Length: 11.4 cm. Echogenicity within normal limits. No mass or hydronephrosis visualized.  Left Kidney:  Length: 10.9 cm. Echogenicity within normal limits. No mass visualized. Renal pelvis was minimally prominent without true hydronephrosis.  Bladder:  Appears normal for degree of bladder distention. Ureteral jets not visualized.  Patient had recently voided.  IMPRESSION: Minimal prominence of the left renal pelvis without hydronephrosis.   Electronically Signed   By: Logan Bores   On: 09/11/2014 19:44   Dg Chest Portable 1 View  09/11/2014   CLINICAL DATA:  Cough, congestion, shortness of breath for 2 weeks  EXAM: PORTABLE CHEST - 1 VIEW  COMPARISON:  11/17/2013  FINDINGS: Cardiomediastinal silhouette is stable. No acute infiltrate or pleural effusion. No pulmonary edema. Mild degenerative changes lower thoracic spine.  IMPRESSION: No active disease.   Electronically Signed   By: Lahoma Crocker M.D.   On: 09/11/2014 12:26    I have reviewed the patient's current medications.  Assessment/Plan: Problem #1 acute kidney injury: Etiology to be multifactorial including ATN/prerenal/ACE/nonsteroidal/polysubstance abuse i.e. cocaine. Presently her function seems to be improving. Patient is non-oliguric.  Problem #2 hypertension: Her blood pressure is reasonably controlled Problem #3 history of diabetes Problem #4 chronic renal failure: Stage III. This could be due to hypertension/nonsteroidal use/cocaine/diabetes. Problem #5 hyperkalemia: Her potassium has corrected Problem #6 metabolic acidosis: Her CO2 is improving Problem #7 polysubstance abuse Plan: We'll continue his present management. We'll check her basic metabolic panel in the morning. We'll follow patient as outpatient when she is discharged.   LOS: 2 days   Vaun Hyndman S 09/13/2014,8:29 AM

## 2014-09-14 ENCOUNTER — Telehealth: Payer: Self-pay | Admitting: Family Medicine

## 2014-09-14 ENCOUNTER — Other Ambulatory Visit: Payer: Self-pay | Admitting: Family Medicine

## 2014-09-14 DIAGNOSIS — R7989 Other specified abnormal findings of blood chemistry: Secondary | ICD-10-CM

## 2014-09-14 LAB — UIFE/LIGHT CHAINS/TP QN, 24-HR UR
Albumin, U: DETECTED
Alpha 1, Urine: DETECTED — AB
Alpha 2, Urine: DETECTED — AB
Beta, Urine: DETECTED — AB
Gamma Globulin, Urine: DETECTED — AB
Total Protein, Urine: 41 mg/dL — ABNORMAL HIGH (ref 5–24)

## 2014-09-14 LAB — PTH, INTACT AND CALCIUM
Calcium, Total (PTH): 8.9 mg/dL (ref 8.4–10.5)
PTH: 139 pg/mL — ABNORMAL HIGH (ref 14–64)

## 2014-09-14 LAB — PROTEIN ELECTROPHORESIS, SERUM
Albumin ELP: 53.2 % — ABNORMAL LOW (ref 55.8–66.1)
Alpha-1-Globulin: 5 % — ABNORMAL HIGH (ref 2.9–4.9)
Alpha-2-Globulin: 16.5 % — ABNORMAL HIGH (ref 7.1–11.8)
Beta 2: 6.1 % (ref 3.2–6.5)
Beta Globulin: 6.5 % (ref 4.7–7.2)
Gamma Globulin: 12.7 % (ref 11.1–18.8)
M-Spike, %: NOT DETECTED g/dL
Total Protein ELP: 6.4 g/dL (ref 6.0–8.3)

## 2014-09-14 LAB — COMPLEMENT, TOTAL: Compl, Total (CH50): >60 U/mL — ABNORMAL HIGH (ref 31–60)

## 2014-09-14 NOTE — Telephone Encounter (Signed)
Patient has been discharged from the hospital and needs a rx send to pharmacy for stool softner and cream that her insurance will pay for she has no money this is what the Dr at the hospital told her to get but it was over the counter also wants to know about the stop smoking class that is free that gives free patches

## 2014-09-17 ENCOUNTER — Telehealth: Payer: Self-pay | Admitting: Family Medicine

## 2014-09-17 ENCOUNTER — Telehealth: Payer: Self-pay

## 2014-09-17 ENCOUNTER — Other Ambulatory Visit: Payer: Self-pay

## 2014-09-17 DIAGNOSIS — IMO0001 Reserved for inherently not codable concepts without codable children: Secondary | ICD-10-CM

## 2014-09-17 DIAGNOSIS — E215 Disorder of parathyroid gland, unspecified: Secondary | ICD-10-CM

## 2014-09-17 DIAGNOSIS — E1165 Type 2 diabetes mellitus with hyperglycemia: Principal | ICD-10-CM

## 2014-09-17 DIAGNOSIS — Z794 Long term (current) use of insulin: Principal | ICD-10-CM

## 2014-09-17 LAB — IMMUNOFIXATION ELECTROPHORESIS
IgA: 195 mg/dL (ref 69–380)
IgG (Immunoglobin G), Serum: 811 mg/dL (ref 690–1700)
IgM, Serum: 37 mg/dL — ABNORMAL LOW (ref 52–322)
Total Protein ELP: 6.9 g/dL (ref 6.0–8.3)

## 2014-09-17 MED ORDER — DOCUSATE SODIUM 100 MG PO CAPS: 100.0000 mg | ORAL_CAPSULE | Freq: Two times a day (BID) | ORAL | Status: DC

## 2014-09-17 MED ORDER — PRAMOXINE HCL 1 % RE FOAM: RECTAL | Status: DC

## 2014-09-17 NOTE — Telephone Encounter (Signed)
Called and left message for patient to return call.  

## 2014-09-17 NOTE — Addendum Note (Signed)
Addended by: Eual Fines on: 09/17/2014 04:07 PM   Modules accepted: Orders

## 2014-09-17 NOTE — Telephone Encounter (Signed)
Er told her to get colace and hemorrhoid cream OTC but she said she don't have the money for that and needs both called in to [place] that the state will pay for.

## 2014-09-17 NOTE — Telephone Encounter (Signed)
pls call [place] see what topical hemorrhoid prep they will cover let me and prescribe twice daily application for 1 week then a s needed, please  ?? pls ask Send in colace 100 mg one twice daily #60 refill3 pls

## 2014-09-17 NOTE — Telephone Encounter (Signed)
Meds sent and left pt a message

## 2014-09-17 NOTE — Telephone Encounter (Signed)
Concern addressed

## 2014-09-17 NOTE — Telephone Encounter (Signed)
pls refer her to endo in [place] per her request, uncontrolled diabetes and also elevated PTH 2 different reasons , i will sign both

## 2014-09-18 ENCOUNTER — Telehealth: Payer: Self-pay

## 2014-09-18 MED ORDER — POLYETHYLENE GLYCOL 3350 17 GM/SCOOP PO POWD: 17.0000 g | Freq: Two times a day (BID) | ORAL | Status: DC | PRN

## 2014-09-18 MED ORDER — PROCTOFOAM 1 % RE FOAM: RECTAL | Status: DC

## 2014-09-18 NOTE — Telephone Encounter (Signed)
Ok to send miralax  Daily, she may still need an OTC stool softener, there is nothing we can do to "make insurances cover certain mkeds, let her know

## 2014-09-18 NOTE — Addendum Note (Signed)
Addended by: Denman George B on: 09/18/2014 01:00 PM   Modules accepted: Orders

## 2014-09-18 NOTE — Telephone Encounter (Signed)
Med sent.

## 2014-09-27 ENCOUNTER — Ambulatory Visit: Payer: Self-pay | Admitting: Endocrinology

## 2014-09-27 DIAGNOSIS — Z7901 Long term (current) use of anticoagulants: Secondary | ICD-10-CM

## 2014-09-27 DIAGNOSIS — N183 Chronic kidney disease, stage 3 (moderate): Secondary | ICD-10-CM

## 2014-09-27 DIAGNOSIS — I129 Hypertensive chronic kidney disease with stage 1 through stage 4 chronic kidney disease, or unspecified chronic kidney disease: Secondary | ICD-10-CM

## 2014-09-27 DIAGNOSIS — E119 Type 2 diabetes mellitus without complications: Secondary | ICD-10-CM

## 2014-10-01 ENCOUNTER — Encounter: Payer: Self-pay | Admitting: Endocrinology

## 2014-10-01 ENCOUNTER — Ambulatory Visit (INDEPENDENT_AMBULATORY_CARE_PROVIDER_SITE_OTHER): Payer: PRIVATE HEALTH INSURANCE | Admitting: Endocrinology

## 2014-10-01 VITALS — BP 134/92 | HR 90 | Temp 98.2°F | Resp 16 | Ht 61.0 in | Wt 182.8 lb

## 2014-10-01 DIAGNOSIS — I10 Essential (primary) hypertension: Secondary | ICD-10-CM

## 2014-10-01 DIAGNOSIS — E785 Hyperlipidemia, unspecified: Secondary | ICD-10-CM

## 2014-10-01 DIAGNOSIS — G629 Polyneuropathy, unspecified: Secondary | ICD-10-CM

## 2014-10-01 DIAGNOSIS — IMO0002 Reserved for concepts with insufficient information to code with codable children: Secondary | ICD-10-CM

## 2014-10-01 DIAGNOSIS — E1165 Type 2 diabetes mellitus with hyperglycemia: Secondary | ICD-10-CM

## 2014-10-01 DIAGNOSIS — E1142 Type 2 diabetes mellitus with diabetic polyneuropathy: Secondary | ICD-10-CM

## 2014-10-01 LAB — RENAL FUNCTION PANEL
Albumin: 3.7 g/dL (ref 3.5–5.2)
BUN: 13 mg/dL (ref 6–23)
CO2: 26 mEq/L (ref 19–32)
Calcium: 9.1 mg/dL (ref 8.4–10.5)
Chloride: 110 mEq/L (ref 96–112)
Creatinine, Ser: 1.3 mg/dL — ABNORMAL HIGH (ref 0.4–1.2)
GFR: 54.7 mL/min — ABNORMAL LOW (ref >60.00)
Glucose, Bld: 98 mg/dL (ref 70–99)
Phosphorus: 3.1 mg/dL (ref 2.3–4.6)
Potassium: 3.7 mEq/L (ref 3.5–5.1)
Sodium: 142 mEq/L (ref 135–145)

## 2014-10-01 MED ORDER — LINAGLIPTIN 5 MG PO TABS: 5.0000 mg | ORAL_TABLET | Freq: Every day | ORAL | Status: DC

## 2014-10-01 NOTE — Patient Instructions (Signed)
Please check blood sugars at least half the time about 2 hours after any meal by rotation and times per week on waking up. Please bring blood sugar monitor to each visit  Take 4 units with supper only

## 2014-10-01 NOTE — Progress Notes (Signed)
Patient ID: [name], female   DOB: 08-18-1966, 48 y.o.   MRN: [id number]           Reason for Appointment: Consultation for Type 2 Diabetes  Referring physician: Moshe Cipro  History of Present Illness:          Diagnosis: Type 2 diabetes mellitus, date of diagnosis: 2011       Past history:  Patient is somewhat unclear about her initial diagnosis but looking at her records she has had a significantly high A1c as far back as 2011 when it was 12.6 She thinks she was initially treated with metformin and glipizide but does not appear to have had good control for several years About a year ago she was switched from metformin and glipizide to insulin She was taking Lantus about 30 units a day along with NovoLog at meals with improved control and A1c down near 7%  Recent history:  Patient has not checked her blood sugar in about 2 months since her meter is not working and not clear what her level of control has been recently When she was admitted to the hospital in 11/15 for acute renal failure she had required much less insulin and was discharged only on small doses of NovoLog at meals without any Lantus or oral agents However appears that she has lost a significant amount of weight and her appetite is still relatively small Today she has not until the early afternoon and her glucose in the office is 101 She is concerned about taking insulin and the discomfort of the injections She is now referred here for further management       Oral hypoglycemic drugs the patient is taking are: None      Side effects from medications have been: None INSULIN regimen is described as: NovoLog 4 units ac tid   Compliance with the medical regimen: Fair Hypoglycemia: None    Glucose monitoring:  none recently        Glucometer: ?  Accu-Chek Blood Glucose readings not available  Self-care: The diet that the patient has been following is: None, eating small portions recently    Meals: 3 meals per day.  Breakfast is egg + toast,  Drinks water, low sugar juice when thirsty         Exercise: minimal because of physical limitations         Dietician visit, most recent: 2013 for weight loss              Weight history: upto 210-220  Wt Readings from Last 3 Encounters:  10/01/14 182 lb 12.8 oz (82.918 kg)  09/11/14 177 lb 0.5 oz (80.3 kg)  09/10/14 170 lb 1.9 oz (77.166 kg)    Glycemic control:   Lab Results  Component Value Date   HGBA1C 7.1* 09/10/2014   HGBA1C 7.2* 05/01/2014   HGBA1C 11.0* 11/17/2013   Lab Results  Component Value Date   MICROALBUR 43.07* 05/24/2014   LDLCALC 68 09/10/2014   CREATININE 1.91* 09/13/2014         Medication List       This list is accurate as of: 10/01/14  2:22 PM.  Always use your most recent med list.               acetaminophen 500 MG tablet  Commonly known as:  TYLENOL  Take 1 tablet (500 mg total) by mouth daily.     amLODipine 10 MG tablet  Commonly known as:  NORVASC  Take  1 tablet (10 mg total) by mouth daily.     clopidogrel 75 MG tablet  Commonly known as:  PLAVIX  Take 1 tablet (75 mg total) by mouth daily with breakfast.     docusate sodium 100 MG capsule  Commonly known as:  COLACE  Take 1 capsule (100 mg total) by mouth 2 (two) times daily.     docusate sodium 100 MG capsule  Commonly known as:  COLACE  Take 1 capsule (100 mg total) by mouth 2 (two) times daily.     FLUoxetine 20 MG capsule  Commonly known as:  PROZAC  Take 1 capsule (20 mg total) by mouth daily.     gabapentin 300 MG capsule  Commonly known as:  NEURONTIN  Take 1 capsule (300 mg total) by mouth 2 (two) times daily.     hydrocortisone 2.5 % rectal cream  Commonly known as:  ANUSOL-HC  Place 1 application rectally 2 (two) times daily.     hydrOXYzine 25 MG tablet  Commonly known as:  ATARAX/VISTARIL  Take 1 tablet (25 mg total) by mouth every 4 (four) hours as needed for anxiety (Sleep).     insulin aspart 100 UNIT/ML FlexPen    Commonly known as:  NOVOLOG FLEXPEN  Inject 4 Units into the skin 3 (three) times daily with meals.     metoprolol tartrate 25 MG tablet  Commonly known as:  LOPRESSOR  Take 0.5 tablets (12.5 mg total) by mouth 2 (two) times daily.     nicotine 21 mg/24hr patch  Commonly known as:  NICODERM CQ - dosed in mg/24 hours  Place 1 patch (21 mg total) onto the skin daily.     pantoprazole 40 MG tablet  Commonly known as:  PROTONIX  Take 1 tablet (40 mg total) by mouth daily.     polyethylene glycol powder powder  Commonly known as:  GLYCOLAX/MIRALAX  Take 17 g by mouth 2 (two) times daily as needed.     PROCTOFOAM 1 % foam  Generic drug:  pramoxine  Use twice daily as needed x 1 week then as needed     rosuvastatin 40 MG tablet  Commonly known as:  CRESTOR  Take 1 tablet (40 mg total) by mouth daily.     traMADol 50 MG tablet  Commonly known as:  ULTRAM  TAKE 1 TABLET BY MOUTH TWICE DAILY.     traZODone 50 MG tablet  Commonly known as:  DESYREL  Take 1 tablet (50 mg total) by mouth at bedtime and may repeat dose one time if needed.        Allergies: No Known Allergies  Past Medical History  Diagnosis Date  . Alcohol use     Now abstinent.  . Diabetes mellitus, type 2   . Obesity   . Hyperlipidemia   . Hypertension   . Anxiety   . Panic attacks   . Hypercholesteremia   . Diabetic Charcot foot 12/30/2011  . RECTAL BLEEDING, HX OF 12/05/2009    Qualifier: Diagnosis of  By: Zeb Comfort    . Ankle fracture, lateral malleolus, closed 12/30/2011  . Breast mass, left 12/15/2011  . Tobacco abuse   . ANXIETY 04/17/2010    Qualifier: Diagnosis of  By: Claybon Jabs PA, Dawn    . Hypertensive cardiomyopathy 11/08/2012    Ejection fraction 40-45%.  . Diastolic dysfunction 1/61/0960    Grade 1.  . CKD (chronic kidney disease)   . Noncompliance with medication regimen  Past Surgical History  Procedure Laterality Date  . Dilation and curettage of uterus    . Breast biopsy       Family History  Problem Relation Age of Onset  . Hypertension Mother   . Hypertension Father     CABG   . Hypertension Sister   . Heart attack Mother   . Arthritis    . Cancer    . Diabetes    . Asthma      Social History:  reports that she has been smoking Cigarettes.  She has a 15 pack-year smoking history. She has never used smokeless tobacco. She reports that she drinks alcohol. She reports that she uses illicit drugs ("Crack" cocaine).    Review of Systems       Vision is normal. Most recent eye exam was 2 years ago       Lipids: On Crestor for treatment with recent good results, still has low HDL      Lab Results  Component Value Date   CHOL 134 09/10/2014   HDL 32* 09/10/2014   LDLCALC 68 09/10/2014   LDLDIRECT 122* 01/08/2010   TRIG 171* 09/10/2014   CHOLHDL 4.2 09/10/2014                  Skin: No rash or infections     Thyroid:  No  unusual fatigue.  No history of thyroid disease     The blood pressure has been high, treated now with amlodipine alone, previously on benazepril also which was stopped in 11/15 when she had renal insufficiency Her blood pressure is higher today but she has not taken her amlodipine also      She has had a tendency to swelling of feet.  Was on diuretics until her hospitalization in 11/15      She may get shortness of breath     She is complaining of chest discomfort over the last 2 days and also off and on     Bowel habits: Normal.      No joint  pains.      No history of vaginal candidias   LMP 10/15; has been complaining of hot flashes for some time         She has a history of Numbness, tingling  in feet, not better with trying gabapentin Hands numb also Legs hurt on walking      Physical Examination:  BP 134/92 mmHg  Pulse 90  Temp(Src) 98.2 F (36.8 C)  Resp 16  Ht 5\' 1"  (1.549 m)  Wt 182 lb 12.8 oz (82.918 kg)  BMI 34.56 kg/m2  SpO2 97%  GENERAL:         Patient has generalized obesity.   HEENT:          Eye exam shows normal external appearance. Fundus exam shows no retinopathy.  Oral exam shows normal mucosa .  NECK:         General:  Neck exam shows no lymphadenopathy. Carotids are normal to palpation and no bruit heard.  Thyroid is not enlarged and no nodules felt.   LUNGS:         Chest is symmetrical. Lungs are clear to auscultation.Marland Kitchen   HEART:         Heart sounds:  S1 and S2 are normal. No murmurs or clicks heard., no S3 or S4.   ABDOMEN:   There is no distention present. Liver and spleen are not palpable. No other mass or tenderness  present.  EXTREMITIES:     There is trace pedal, more on the left edema. No skin lesions present.Marland Kitchen  NEUROLOGICAL:   Vibration sense is mildly reduced in toes. Ankle jerks are absent bilaterally.          Diabetic foot exam shows normal monofilament sensation in the toes and plantar surfaces, no skin lesions or ulcers on the feet and normal pedal pulses.  Appears to have Charcot deformity of the left proximal foot MUSCULOSKELETAL:  her large joints appear normal, finger joints are normal. Spine is normal to inspection.Marland Kitchen   SKIN:       No rash or lesions    ASSESSMENT:  Diabetes type 2, with obesity     Although she has had persistent poor control of her diabetes until this year; was apparently better controlled with a regimen of basal bolus insulin Recently she appears to be doing well without any basal insulin since her hospitalization for renal failure in 11/15 and is only on small doses of mealtime insulin She has at the same time lost a significant amount of weight and her portion control has improved significantly because of decreased appetite Glucose in the office is fairly good today although it is fasting Currently difficult to know what her blood sugar patterns are since she is not monitoring her glucose  Complications: Probably has neuropathy with appearance of Charcot foot on the left side.  Also has some neuropathic symptoms in her feet and  legs as well as hands Also has had increased urine microalbumin.  No recent eye exams available  Hypercholesterolemia: This is well controlled on Crestor.  HYPERTENSION: She needs reassessment of her medications since these were changed on her hospitalization and blood pressure is not well-controlled today  PLAN:   Trial of Tradgenta 5 mg daily.  Given information on the medication and a 30 day free trial card  She can try taking NovoLog only at suppertime for now, continuing 4 units and may not need this if blood sugars are improved  May consider stopping her insulin if blood sugars are consistently controlled on the next visit  Recheck renal function today  To follow-up with PCP regarding pressure management, other medical issues  To follow-up with her orthopedic surgeon for management of her Charcot foot  Have given her a new Accu-Chek monitor today and demonstrated how to use this.  Discussed timing of glucose monitoring including doing some readings after meals  Recommended regular eye exam  Consider consultation with dietitian    [facility] 10/01/2014, 2:22 PM   Note: This office note was prepared with Dragon voice recognition system technology. Any transcriptional errors that result from this process are unintentional.

## 2014-10-10 NOTE — Telephone Encounter (Signed)
noted 

## 2014-10-11 ENCOUNTER — Telehealth: Payer: Self-pay | Admitting: *Deleted

## 2014-10-11 NOTE — Telephone Encounter (Signed)
Pt called requesting a med refill, and pt said she talked with her case manager and her case manager told her to call her PCP to get a nurse aid to help her and to help her take her medication per pt she is not taking her medication right and she is out of pills and she needs a refill and a nurse aid to come to her home. Please advise [phone]

## 2014-10-11 NOTE — Telephone Encounter (Signed)
Called and left message for patient to return call.  

## 2014-10-11 NOTE — Telephone Encounter (Signed)
[facility]

## 2014-10-12 ENCOUNTER — Other Ambulatory Visit: Payer: Self-pay

## 2014-10-12 DIAGNOSIS — I10 Essential (primary) hypertension: Secondary | ICD-10-CM

## 2014-10-12 MED ORDER — AMLODIPINE BESYLATE 10 MG PO TABS: 10.0000 mg | ORAL_TABLET | Freq: Every day | ORAL | Status: DC

## 2014-10-12 MED ORDER — TRAZODONE HCL 50 MG PO TABS: 50.0000 mg | ORAL_TABLET | Freq: Every evening | ORAL | Status: DC | PRN

## 2014-10-12 MED ORDER — ROSUVASTATIN CALCIUM 40 MG PO TABS: 40.0000 mg | ORAL_TABLET | Freq: Every day | ORAL | Status: DC

## 2014-10-12 MED ORDER — GABAPENTIN 300 MG PO CAPS: 300.0000 mg | ORAL_CAPSULE | Freq: Two times a day (BID) | ORAL | Status: DC

## 2014-10-12 MED ORDER — INSULIN ASPART 100 UNIT/ML FLEXPEN: 4.0000 [IU] | PEN_INJECTOR | Freq: Three times a day (TID) | SUBCUTANEOUS | Status: DC

## 2014-10-12 MED ORDER — FLUOXETINE HCL 20 MG PO CAPS: 20.0000 mg | ORAL_CAPSULE | Freq: Every day | ORAL | Status: DC

## 2014-10-12 MED ORDER — CLOPIDOGREL BISULFATE 75 MG PO TABS: 75.0000 mg | ORAL_TABLET | Freq: Every day | ORAL | Status: DC

## 2014-10-12 MED ORDER — TRAMADOL HCL 50 MG PO TABS: 50.0000 mg | ORAL_TABLET | Freq: Two times a day (BID) | ORAL | Status: DC

## 2014-10-12 MED ORDER — LINAGLIPTIN 5 MG PO TABS: 5.0000 mg | ORAL_TABLET | Freq: Every day | ORAL | Status: DC

## 2014-10-12 MED ORDER — METOPROLOL TARTRATE 25 MG PO TABS: 12.5000 mg | ORAL_TABLET | Freq: Two times a day (BID) | ORAL | Status: DC

## 2014-10-12 MED ORDER — PANTOPRAZOLE SODIUM 40 MG PO TBEC: 40.0000 mg | DELAYED_RELEASE_TABLET | Freq: Every day | ORAL | Status: DC

## 2014-10-23 ENCOUNTER — Telehealth: Payer: Self-pay | Admitting: Family Medicine

## 2014-10-23 NOTE — Telephone Encounter (Signed)
pls see if a pain clinic in [place] will see her expolain that Dr Lyla Son will not see her also pls

## 2014-10-24 NOTE — Telephone Encounter (Signed)
Faxed papers to [facility]

## 2014-10-29 ENCOUNTER — Encounter: Payer: Self-pay | Admitting: Endocrinology

## 2014-10-29 ENCOUNTER — Ambulatory Visit: Payer: Self-pay | Admitting: Endocrinology

## 2014-10-29 ENCOUNTER — Ambulatory Visit (INDEPENDENT_AMBULATORY_CARE_PROVIDER_SITE_OTHER): Payer: Medicaid Other | Admitting: Endocrinology

## 2014-10-29 VITALS — BP 182/129 | HR 91 | Temp 98.2°F | Resp 14 | Ht 61.0 in | Wt 184.4 lb

## 2014-10-29 DIAGNOSIS — I10 Essential (primary) hypertension: Secondary | ICD-10-CM

## 2014-10-29 DIAGNOSIS — E119 Type 2 diabetes mellitus without complications: Secondary | ICD-10-CM

## 2014-10-29 LAB — GLUCOSE, POCT (MANUAL RESULT ENTRY): POC Glucose: 163 mg/dl — AB (ref 70–99)

## 2014-10-29 MED ORDER — REPAGLINIDE 1 MG PO TABS: 1.0000 mg | ORAL_TABLET | Freq: Three times a day (TID) | ORAL | Status: DC

## 2014-10-29 NOTE — Patient Instructions (Signed)
Prandin 1mg  before each meal, take upto 30 min before eating; stay on Tradgenta  Resart 1/2 Benazepril in addition to amlodine

## 2014-10-29 NOTE — Progress Notes (Signed)
Patient ID: [name], female   DOB: 08-Feb-1966, 49 y.o.   MRN: [id number]           Reason for Appointment: Consultation for Type 2 Diabetes  Referring physician: Moshe Cipro  History of Present Illness:          Diagnosis: Type 2 diabetes mellitus, date of diagnosis: 2011       Past history:  Patient is somewhat unclear about her initial diagnosis but looking at her records she has had a significantly high A1c as far back as 2011 when it was 12.6 She thinks she was initially treated with metformin and glipizide but does not appear to have had good control for several years About a year ago she was switched from metformin and glipizide to insulin She was taking Lantus about 30 units a day along with NovoLog at meals with improved control and A1c down near 7%  Recent history:    When she was admitted to the hospital in 11/15 for acute renal failure she was discharged only on small doses of NovoLog at meals and no Lantus  She is still taking NovoLog 4 units before meals She thinks she is eating 3 meals a day although did not have lunch today Blood sugars are looking slightly higher than on her last visit even though she has been started on Tradjenta However she thinks she is checking her blood sugars 4 times a day and has not had readings over 140 She is concerned about taking insulin and the discomfort of the injections       Oral hypoglycemic drugs the patient is taking are: Tradjenta 5 mg daily      Side effects from medications have been: None INSULIN regimen is described as: NovoLog 4 units ac tid   Compliance with the medical regimen: Good Hypoglycemia: None    Glucose monitoring:  4x daily recently        Glucometer: ?  Accu-Chek Blood Glucose readings 128-140  Self-care: The diet that the patient has been following is: None, eating small portions      Meals: 3 meals per day. Breakfast is egg + toast,  Drinks water, low sugar juice when thirsty         Exercise: minimal  because of physical limitations         Dietician visit, most recent: 2013 for weight loss              Weight history: upto 210-220   Wt Readings from Last 3 Encounters:  10/29/14 184 lb 6.4 oz (83.643 kg)  10/01/14 182 lb 12.8 oz (82.918 kg)  09/11/14 177 lb 0.5 oz (80.3 kg)    Glycemic control:   Lab Results  Component Value Date   HGBA1C 7.1* 09/10/2014   HGBA1C 7.2* 05/01/2014   HGBA1C 11.0* 11/17/2013   Lab Results  Component Value Date   MICROALBUR 43.07* 05/24/2014   LDLCALC 68 09/10/2014   CREATININE 1.3* 10/01/2014         Medication List       This list is accurate as of: 10/29/14  2:59 PM.  Always use your most recent med list.               acetaminophen 500 MG tablet  Commonly known as:  TYLENOL  Take 1 tablet (500 mg total) by mouth daily.     amLODipine 10 MG tablet  Commonly known as:  NORVASC  Take 1 tablet (10 mg total) by mouth daily.  clopidogrel 75 MG tablet  Commonly known as:  PLAVIX  Take 1 tablet (75 mg total) by mouth daily with breakfast.     docusate sodium 100 MG capsule  Commonly known as:  COLACE  Take 1 capsule (100 mg total) by mouth 2 (two) times daily.     FLUoxetine 20 MG capsule  Commonly known as:  PROZAC  Take 1 capsule (20 mg total) by mouth daily.     gabapentin 300 MG capsule  Commonly known as:  NEURONTIN  Take 1 capsule (300 mg total) by mouth 2 (two) times daily.     hydrocortisone 2.5 % rectal cream  Commonly known as:  ANUSOL-HC  Place 1 application rectally 2 (two) times daily.     hydrOXYzine 25 MG tablet  Commonly known as:  ATARAX/VISTARIL  Take 1 tablet (25 mg total) by mouth every 4 (four) hours as needed for anxiety (Sleep).     insulin aspart 100 UNIT/ML FlexPen  Commonly known as:  NOVOLOG FLEXPEN  Inject 4 Units into the skin 3 (three) times daily with meals.     linagliptin 5 MG Tabs tablet  Commonly known as:  TRADJENTA  Take 1 tablet (5 mg total) by mouth daily.     metoprolol  tartrate 25 MG tablet  Commonly known as:  LOPRESSOR  Take 0.5 tablets (12.5 mg total) by mouth 2 (two) times daily.     pantoprazole 40 MG tablet  Commonly known as:  PROTONIX  Take 1 tablet (40 mg total) by mouth daily.     polyethylene glycol powder powder  Commonly known as:  GLYCOLAX/MIRALAX  Take 17 g by mouth 2 (two) times daily as needed.     PROCTOFOAM 1 % foam  Generic drug:  pramoxine  Use twice daily as needed x 1 week then as needed     rosuvastatin 40 MG tablet  Commonly known as:  CRESTOR  Take 1 tablet (40 mg total) by mouth daily.     traMADol 50 MG tablet  Commonly known as:  ULTRAM  Take 1 tablet (50 mg total) by mouth 2 (two) times daily.     traZODone 50 MG tablet  Commonly known as:  DESYREL  Take 1 tablet (50 mg total) by mouth at bedtime and may repeat dose one time if needed.        Allergies: No Known Allergies  Past Medical History  Diagnosis Date  . Alcohol use     Now abstinent.  . Diabetes mellitus, type 2   . Obesity   . Hyperlipidemia   . Hypertension   . Anxiety   . Panic attacks   . Hypercholesteremia   . Diabetic Charcot foot 12/30/2011  . RECTAL BLEEDING, HX OF 12/05/2009    Qualifier: Diagnosis of  By: Zeb Comfort    . Ankle fracture, lateral malleolus, closed 12/30/2011  . Breast mass, left 12/15/2011  . Tobacco abuse   . ANXIETY 04/17/2010    Qualifier: Diagnosis of  By: Claybon Jabs PA, Dawn    . Hypertensive cardiomyopathy 11/08/2012    Ejection fraction 40-45%.  . Diastolic dysfunction 8/52/7782    Grade 1.  . CKD (chronic kidney disease)   . Noncompliance with medication regimen     Past Surgical History  Procedure Laterality Date  . Dilation and curettage of uterus    . Breast biopsy      Family History  Problem Relation Age of Onset  . Hypertension Mother   . Hypertension Father  CABG   . Hypertension Sister   . Heart attack Mother   . Arthritis    . Cancer    . Diabetes    . Asthma      Social  History:  reports that she has been smoking Cigarettes.  She has a 15 pack-year smoking history. She has never used smokeless tobacco. She reports that she drinks alcohol. She reports that she uses illicit drugs ("Crack" cocaine).    Review of Systems        Lipids: On Crestor for treatment with recent good results,  has low HDL      Lab Results  Component Value Date   CHOL 134 09/10/2014   HDL 32* 09/10/2014   LDLCALC 68 09/10/2014   LDLDIRECT 122* 01/08/2010   TRIG 171* 09/10/2014   CHOLHDL 4.2 09/10/2014          The blood pressure has been high, treated now with amlodipine alone, previously on benazepril also which was stopped in 11/15 when she had renal insufficiency Her blood pressure is much higher today and again she has not taken her amlodipine however has been otherwise taking 10 mg regularly     RENAL INSUFFICIENCY: Her last creatinine was 1.3 compared to 1.9 in November     Physical Examination:  BP 182/129 mmHg  Pulse 91  Temp(Src) 98.2 F (36.8 C)  Resp 14  Ht 5\' 1"  (1.549 m)  Wt 184 lb 6.4 oz (83.643 kg)  BMI 34.86 kg/m2  SpO2 98%  Repeat blood pressure 150/104 left arm   No pedal edema    ASSESSMENT /PLAN:   Diabetes type 2, with obesity     Although she is only on small doses of NovoLog insulin she is still appears to have mild hyperglycemia with adding Tradjenta Most likely has some insulin deficiency However she is reluctant to continue insulin and will be given a trial of Prandin before meals She also agrees to see the dietitian She will need to bring her monitor on her follow-up visit  HYPERTENSION:  She is due to see her PCP later this month and in the meantime will start at least half a tablet of benazepril along with her amlodipine     Elizebeth Kluesner 10/29/2014, 2:59 PM   Note: This office note was prepared with Dragon voice recognition system technology. Any transcriptional errors that result from this process are unintentional.

## 2014-10-30 DIAGNOSIS — N183 Chronic kidney disease, stage 3 (moderate): Secondary | ICD-10-CM | POA: Diagnosis not present

## 2014-10-31 ENCOUNTER — Other Ambulatory Visit: Payer: Self-pay | Admitting: *Deleted

## 2014-10-31 NOTE — Telephone Encounter (Signed)
Faxed to [facility] to the pain specialists

## 2014-11-08 ENCOUNTER — Other Ambulatory Visit: Payer: Self-pay

## 2014-11-08 ENCOUNTER — Telehealth: Payer: Self-pay | Admitting: Family Medicine

## 2014-11-08 MED ORDER — MONTELUKAST SODIUM 10 MG PO TABS: 10.0000 mg | ORAL_TABLET | Freq: Every day | ORAL | Status: DC

## 2014-11-08 MED ORDER — BENZONATATE 100 MG PO CAPS: 100.0000 mg | ORAL_CAPSULE | Freq: Two times a day (BID) | ORAL | Status: DC | PRN

## 2014-11-08 NOTE — Telephone Encounter (Signed)
Tessalon perle 100mg  twice daily as  Needed # 20 only, and singulair 10mg  one daily #30 refill 1, pls send and let her know

## 2014-11-08 NOTE — Telephone Encounter (Signed)
meds sent

## 2014-11-08 NOTE — Telephone Encounter (Signed)
States she can't afford to buy anything for a cold OTC. Has been coughing up whitish phlegm x 3 days and her voice is scratchy, no fever, chills etc. Wants something [facility] to [facility]

## 2014-11-08 NOTE — Telephone Encounter (Signed)
Aware that nothing is covered by [facility]

## 2014-11-08 NOTE — Telephone Encounter (Signed)
Sent and patient aware

## 2014-11-12 DIAGNOSIS — I509 Heart failure, unspecified: Secondary | ICD-10-CM | POA: Diagnosis not present

## 2014-11-19 ENCOUNTER — Encounter: Payer: PRIVATE HEALTH INSURANCE | Admitting: Family Medicine

## 2014-11-26 DIAGNOSIS — I509 Heart failure, unspecified: Secondary | ICD-10-CM | POA: Diagnosis not present

## 2014-11-29 ENCOUNTER — Encounter: Payer: Self-pay | Admitting: *Deleted

## 2014-11-29 ENCOUNTER — Encounter: Payer: Medicaid Other | Admitting: Family Medicine

## 2014-12-10 ENCOUNTER — Ambulatory Visit: Payer: Self-pay | Admitting: Endocrinology

## 2014-12-10 ENCOUNTER — Ambulatory Visit: Payer: Self-pay | Admitting: Dietician

## 2014-12-10 ENCOUNTER — Telehealth: Payer: Self-pay | Admitting: Endocrinology

## 2014-12-10 NOTE — Telephone Encounter (Signed)
Patient no showed today's appt. Please advise on how to follow up. °A. No follow up necessary. °B. Follow up urgent. Contact patient immediately. °C. Follow up necessary. Contact patient and schedule visit in ___ days. °D. Follow up advised. Contact patient and schedule visit in ____weeks. ° °

## 2014-12-13 DIAGNOSIS — I509 Heart failure, unspecified: Secondary | ICD-10-CM | POA: Diagnosis not present

## 2014-12-16 NOTE — Telephone Encounter (Signed)
Follow up necessary. Contact patient and schedule visit asap

## 2014-12-17 ENCOUNTER — Other Ambulatory Visit: Payer: Self-pay | Admitting: Family Medicine

## 2014-12-20 NOTE — Telephone Encounter (Signed)
Spoke with pt she is going to call back to schedule she was on the other phone line.

## 2015-01-09 ENCOUNTER — Other Ambulatory Visit: Payer: Self-pay | Admitting: Family Medicine

## 2015-01-10 ENCOUNTER — Telehealth: Payer: Self-pay | Admitting: Endocrinology

## 2015-01-10 MED ORDER — REPAGLINIDE 1 MG PO TABS: 1.0000 mg | ORAL_TABLET | Freq: Three times a day (TID) | ORAL | Status: DC

## 2015-01-10 NOTE — Telephone Encounter (Signed)
Refill done.  

## 2015-01-10 NOTE — Telephone Encounter (Signed)
Patient need refill of Prandin 2 mg

## 2015-01-11 DIAGNOSIS — I509 Heart failure, unspecified: Secondary | ICD-10-CM | POA: Diagnosis not present

## 2015-01-22 ENCOUNTER — Encounter: Payer: Self-pay | Admitting: Family Medicine

## 2015-01-22 ENCOUNTER — Other Ambulatory Visit: Payer: Self-pay | Admitting: Family Medicine

## 2015-01-22 ENCOUNTER — Ambulatory Visit (INDEPENDENT_AMBULATORY_CARE_PROVIDER_SITE_OTHER): Payer: Medicare Other | Admitting: Family Medicine

## 2015-01-22 VITALS — BP 200/114 | HR 98 | Resp 16 | Ht 61.0 in | Wt 192.0 lb

## 2015-01-22 DIAGNOSIS — J441 Chronic obstructive pulmonary disease with (acute) exacerbation: Secondary | ICD-10-CM

## 2015-01-22 DIAGNOSIS — E109 Type 1 diabetes mellitus without complications: Secondary | ICD-10-CM | POA: Diagnosis not present

## 2015-01-22 DIAGNOSIS — Z9119 Patient's noncompliance with other medical treatment and regimen: Secondary | ICD-10-CM

## 2015-01-22 DIAGNOSIS — Z91199 Patient's noncompliance with other medical treatment and regimen due to unspecified reason: Secondary | ICD-10-CM

## 2015-01-22 DIAGNOSIS — Z794 Long term (current) use of insulin: Secondary | ICD-10-CM

## 2015-01-22 DIAGNOSIS — E1065 Type 1 diabetes mellitus with hyperglycemia: Secondary | ICD-10-CM | POA: Diagnosis not present

## 2015-01-22 DIAGNOSIS — F17219 Nicotine dependence, cigarettes, with unspecified nicotine-induced disorders: Secondary | ICD-10-CM | POA: Diagnosis not present

## 2015-01-22 DIAGNOSIS — IMO0001 Reserved for inherently not codable concepts without codable children: Secondary | ICD-10-CM

## 2015-01-22 DIAGNOSIS — F332 Major depressive disorder, recurrent severe without psychotic features: Secondary | ICD-10-CM

## 2015-01-22 DIAGNOSIS — I10 Essential (primary) hypertension: Secondary | ICD-10-CM

## 2015-01-22 DIAGNOSIS — E1165 Type 2 diabetes mellitus with hyperglycemia: Secondary | ICD-10-CM

## 2015-01-22 MED ORDER — ALBUTEROL SULFATE HFA 108 (90 BASE) MCG/ACT IN AERS: 2.0000 | INHALATION_SPRAY | Freq: Four times a day (QID) | RESPIRATORY_TRACT | Status: DC | PRN

## 2015-01-22 MED ORDER — FLUTICASONE PROPIONATE 50 MCG/ACT NA SUSP: 2.0000 | Freq: Every day | NASAL | Status: DC

## 2015-01-22 MED ORDER — NICOTINE 21 MG/24HR TD PT24: 21.0000 mg | MEDICATED_PATCH | Freq: Every day | TRANSDERMAL | Status: DC

## 2015-01-22 MED ORDER — BUDESONIDE-FORMOTEROL FUMARATE 80-4.5 MCG/ACT IN AERO: 2.0000 | INHALATION_SPRAY | Freq: Two times a day (BID) | RESPIRATORY_TRACT | Status: DC

## 2015-01-22 NOTE — Patient Instructions (Addendum)
F/u in 3 weeks, BRING ALL MEDICATION  Labs today  New medication sent, albuterol, symbicort, nasal spray and nicoderm patches  DO NOT SMOKE and use the patch at the same time,NO MORE CVIGARETTES once you have the patch  BP very high, need to take medication every day It is important that you exercise regularly at least 30 minutes 5 times a week. If you develop chest pain, have severe difficulty breathing, or feel very tired, stop exercising immediately and seek medical attention   A healthy diet is rich in fruit, vegetables and whole grains. Poultry fish, nuts and beans are a healthy choice for protein rather then red meat. A low sodium diet and drinking 64 ounces of water daily is generally recommended. Oils and sweet should be limited. Carbohydrates especially for those who are diabetic or overweight, should be limited to 3o-45 gram per meal. It is important to eat on a regular schedule, at least 3 times daily. Snacks should be primarily fruits, vegetables or nuts.

## 2015-01-23 ENCOUNTER — Other Ambulatory Visit: Payer: Self-pay | Admitting: Family Medicine

## 2015-01-23 LAB — COMPLETE METABOLIC PANEL WITH GFR
ALT: 11 U/L (ref 0–35)
AST: 17 U/L (ref 0–37)
Albumin: 3.7 g/dL (ref 3.5–5.2)
Alkaline Phosphatase: 111 U/L (ref 39–117)
BUN: 16 mg/dL (ref 6–23)
CO2: 26 mEq/L (ref 19–32)
Calcium: 9 mg/dL (ref 8.4–10.5)
Chloride: 105 mEq/L (ref 96–112)
Creat: 1.15 mg/dL — ABNORMAL HIGH (ref 0.50–1.10)
GFR, Est African American: 64 mL/min
GFR, Est Non African American: 56 mL/min — ABNORMAL LOW
Glucose, Bld: 124 mg/dL — ABNORMAL HIGH (ref 70–99)
Potassium: 3.5 mEq/L (ref 3.5–5.3)
Sodium: 142 mEq/L (ref 135–145)
Total Bilirubin: 0.5 mg/dL (ref 0.2–1.2)
Total Protein: 6.7 g/dL (ref 6.0–8.3)

## 2015-01-23 LAB — LIPID PANEL
Cholesterol: 236 mg/dL — ABNORMAL HIGH (ref 0–200)
HDL: 41 mg/dL — ABNORMAL LOW (ref >=46)
LDL Cholesterol: 144 mg/dL — ABNORMAL HIGH (ref 0–99)
Total CHOL/HDL Ratio: 5.8 Ratio
Triglycerides: 255 mg/dL — ABNORMAL HIGH (ref <150)
VLDL: 51 mg/dL — ABNORMAL HIGH (ref 0–40)

## 2015-01-23 LAB — HEMOGLOBIN A1C
Hgb A1c MFr Bld: 7 % — ABNORMAL HIGH (ref <5.7)
Mean Plasma Glucose: 154 mg/dL — ABNORMAL HIGH (ref <117)

## 2015-01-24 ENCOUNTER — Telehealth: Payer: Self-pay | Admitting: *Deleted

## 2015-01-24 NOTE — Telephone Encounter (Signed)
Did not have medss, do not know what she is taking , call pharmacy for info re if on muscle relaxer,and fax in robaxin  500mg  one twice daily as needed for muscle spasm # 60 no refills please and let her know pls

## 2015-01-24 NOTE — Telephone Encounter (Signed)
Woke up with spasms in both shoulder blades.  States she would like a muscle relaxer to help with it. Please advise

## 2015-01-24 NOTE — Telephone Encounter (Signed)
Pt called requesting a muscle relaxer for her neck. Please advise

## 2015-01-29 ENCOUNTER — Other Ambulatory Visit: Payer: Self-pay

## 2015-01-29 MED ORDER — EZETIMIBE 10 MG PO TABS: 10.0000 mg | ORAL_TABLET | Freq: Every day | ORAL | Status: DC

## 2015-01-30 ENCOUNTER — Telehealth: Payer: Self-pay

## 2015-01-30 ENCOUNTER — Other Ambulatory Visit: Payer: Self-pay

## 2015-01-30 MED ORDER — CYCLOBENZAPRINE HCL 10 MG PO TABS: ORAL_TABLET | ORAL | Status: DC

## 2015-01-30 MED ORDER — METHOCARBAMOL 500 MG PO TABS: 500.0000 mg | ORAL_TABLET | Freq: Two times a day (BID) | ORAL | Status: DC | PRN

## 2015-01-30 MED ORDER — TIZANIDINE HCL 2 MG PO CAPS: 2.0000 mg | ORAL_CAPSULE | Freq: Two times a day (BID) | ORAL | Status: DC

## 2015-01-30 NOTE — Telephone Encounter (Signed)
error 

## 2015-01-30 NOTE — Telephone Encounter (Signed)
I have entered change, pls print and send and let pt know

## 2015-01-30 NOTE — Telephone Encounter (Signed)
Thank you very much 

## 2015-01-30 NOTE — Telephone Encounter (Signed)
Med faxed   

## 2015-01-30 NOTE — Telephone Encounter (Signed)
states insurance doesn't cover robaxin. Wants it changed to flexeril instead. Please advise

## 2015-02-03 DIAGNOSIS — F17219 Nicotine dependence, cigarettes, with unspecified nicotine-induced disorders: Secondary | ICD-10-CM | POA: Insufficient documentation

## 2015-02-03 DIAGNOSIS — G894 Chronic pain syndrome: Secondary | ICD-10-CM | POA: Insufficient documentation

## 2015-02-03 DIAGNOSIS — Z23 Encounter for immunization: Secondary | ICD-10-CM | POA: Insufficient documentation

## 2015-02-03 NOTE — Assessment & Plan Note (Signed)
After obtaining informed consent, the vaccine is  administered by LPN.  

## 2015-02-03 NOTE — Assessment & Plan Note (Signed)
Over corrected, discontinue spironolactone

## 2015-02-03 NOTE — Assessment & Plan Note (Signed)
Unchanged Patient counseled for approximately 5 minutes regarding the health risks of ongoing nicotine use, specifically all types of cancer, heart disease, stroke and respiratory failure. The options available for help with cessation ,the behavioral changes to assist the process, and the option to either gradully reduce usage  Or abruptly stop.is also discussed. Pt is also encouraged to set specific goals in number of cigarettes used daily, as well as to set a quit date.  Number of cigarettes/cigars currently smoking daily: 15

## 2015-02-03 NOTE — Assessment & Plan Note (Signed)
Requests a letter of neccessity be written so that she may keep her dog in her home, on mental health grounds I believe that she does qualify and will write a letter on her behalf

## 2015-02-03 NOTE — Assessment & Plan Note (Signed)
Chronic uncontrolled generalized pain, involving back , bilateral footr pain  Due to charcot's disease, increase gabapentin dose and refer to pain management

## 2015-02-03 NOTE — Assessment & Plan Note (Signed)
Patient educated about the importance of limiting  Carbohydrate intake , the need to commit to daily physical activity for a minimum of 30 minutes , and to commit weight loss. The fact that changes in all these areas will reduce or eliminate all together the development of diabetes is stressed.   Diabetic Labs Latest Ref Rng 01/22/2015 10/01/2014 09/13/2014 09/12/2014 09/11/2014  HbA1c <5.7 % 7.0(H) - - - -  Microalbumin 0.00 - 1.89 mg/dL - - - - -  Micro/Creat Ratio 0.0 - 30.0 mg/g - - - - -  Chol 0 - 200 mg/dL 236(H) - - - -  HDL >=46 mg/dL 41(L) - - - -  Calc LDL 0 - 99 mg/dL 144(H) - - - -  Triglycerides <150 mg/dL 255(H) - - - -  Creatinine 0.50 - 1.10 mg/dL 1.15(H) 1.3(H) 1.91(H) 2.44(H) 2.78(H)  GFR >60.00 mL/min - 54.70(L) - - -   BP/Weight 01/22/2015 10/29/2014 10/01/2014 09/13/2014 09/11/2014 09/10/2014 3/90/3009  Systolic BP 233 007 622 633 - 98 354  Diastolic BP 562 563 92 85 - 72 100  Wt. (Lbs) 192 184.4 182.8 - 177.03 170.12 183.12  BMI 36.3 34.86 34.56 - 33.47 32.16 34.62  Some encounter information is confidential and restricted. Go to Review Flowsheets activity to see all data.   Foot/eye exam completion dates 10/01/2014 09/10/2014  Foot exam Order - -  Foot Form Completion Done Done  Followed by endo in [facility]

## 2015-02-11 DIAGNOSIS — I509 Heart failure, unspecified: Secondary | ICD-10-CM | POA: Diagnosis not present

## 2015-02-14 ENCOUNTER — Other Ambulatory Visit: Payer: Self-pay | Admitting: Family Medicine

## 2015-02-16 DIAGNOSIS — J441 Chronic obstructive pulmonary disease with (acute) exacerbation: Secondary | ICD-10-CM | POA: Insufficient documentation

## 2015-02-16 NOTE — Assessment & Plan Note (Signed)
Medications prescribed and nicotine cessation encouraged

## 2015-02-16 NOTE — Progress Notes (Signed)
[name]     MRN: [id number]      DOB: Mar 18, 1966   HPI [name] is here for follow up and re-evaluation of chronic medical conditions, medication management and review of any available recent lab and radiology data.  Preventive health is updated, specifically  Cancer screening and Immunization.   Questions or concerns regarding consultations or procedures which the PT has had in the interim are  addressed. The PT denies any adverse reactions to current medications since the last visit.     ROS Denies recent fever or chills. Denies sinus pressure, nasal congestion, ear pain or sore throat. C/o  chest congestion,  cough and  wheezing. Denies chest pains, palpitations and leg swelling Denies abdominal pain, nausea, vomiting,diarrhea or constipation.   Denies dysuria, frequency, hesitancy or incontinence. C/o chronic  joint pain, swelling and limitation in mobility. Denies headaches, seizures, numbness, or tingling. Denies uncontrolled  depression, anxiety or insomnia. Denies skin break down or rash.   PE  BP 200/114 mmHg  Pulse 98  Resp 16  Ht 5\' 1"  (1.549 m)  Wt 192 lb (87.091 kg)  BMI 36.30 kg/m2  SpO2 99%  Patient alert and oriented and in no cardiopulmonary distress.  HEENT: No facial asymmetry, EOMI,   oropharynx pink and moist.  Neck supple no JVD, no mass.  Chest: decreased though adequate air entry, scattered crackles and wheezes  CVS: S1, S2 no murmurs, no S3.Regular rate.  ABD: Soft non tender.   Ext: No edema  MS: decreased  ROM spine, shoulders, hips and knees.  Skin: Intact, no ulcerations or rash noted.  Psych: Good eye contact, normal affect. Memory intact  anxious not  depressed appearing.  CNS: CN 2-12 intact, power,  normal throughout.no focal deficits noted.   Assessment & Plan   HTN (hypertension), malignant Uncontrolled due to non compliance, re educated re the need for compliance, and the fact that sh is increasing her risk of  recurrent CVa, and other risks of uncontrolled HTN  DASH diet and commitment to daily physical activity for a minimum of 30 minutes discussed and encouraged, as a part of hypertension management. The importance of attaining a healthy weight is also discussed.  BP/Weight 01/22/2015 10/29/2014 10/01/2014 09/13/2014 09/11/2014 09/10/2014 01/14/253  Systolic BP 270 623 762 831 - 98 517  Diastolic BP 616 073 92 85 - 72 100  Wt. (Lbs) 192 184.4 182.8 - 177.03 170.12 183.12  BMI 36.3 34.86 34.56 - 33.47 32.16 34.62  Some encounter information is confidential and restricted. Go to Review Flowsheets activity to see all data.         Diabetes mellitus, insulin dependent (IDDM), uncontrolled  Improved, treated by endo. Patient educated about the importance of limiting  Carbohydrate intake , the need to commit to daily physical activity for a minimum of 30 minutes , and to commit weight loss. The fact that changes in all these areas will improve control of her diabetes and reduce complications of the disease   Diabetic Labs Latest Ref Rng 01/22/2015 10/01/2014 09/13/2014 09/12/2014 09/11/2014  HbA1c <5.7 % 7.0(H) - - - -  Microalbumin 0.00 - 1.89 mg/dL - - - - -  Micro/Creat Ratio 0.0 - 30.0 mg/g - - - - -  Chol 0 - 200 mg/dL 236(H) - - - -  HDL >=46 mg/dL 41(L) - - - -  Calc LDL 0 - 99 mg/dL 144(H) - - - -  Triglycerides <150 mg/dL 255(H) - - - -  Creatinine 0.50 - 1.10 mg/dL 1.15(H) 1.3(H) 1.91(H) 2.44(H) 2.78(H)  GFR >60.00 mL/min - 54.70(L) - - -   BP/Weight 01/22/2015 10/29/2014 10/01/2014 09/13/2014 09/11/2014 09/10/2014 4/97/0263  Systolic BP 785 885 027 741 - 98 287  Diastolic BP 867 672 92 85 - 72 100  Wt. (Lbs) 192 184.4 182.8 - 177.03 170.12 183.12  BMI 36.3 34.86 34.56 - 33.47 32.16 34.62  Some encounter information is confidential and restricted. Go to Review Flowsheets activity to see all data.   Foot/eye exam completion dates 10/01/2014 09/10/2014  Foot exam Order - -  Foot Form  Completion Done Done        Cigarette nicotine dependence with nicotine-induced disorder Deteriorated Patient counseled for approximately 5 minutes regarding the health risks of ongoing nicotine use, specifically all types of cancer, heart disease, stroke and respiratory failure. The options available for help with cessation ,the behavioral changes to assist the process, and the option to either gradully reduce usage  Or abruptly stop.is also discussed. Pt is also encouraged to set specific goals in number of cigarettes used daily, as well as to set a quit date.  Number of cigarettes/cigars currently smoking daily: 20    Severe recurrent major depression without psychotic features Being treated by mental health and denies suicidal or homicidal ideation currently, living independently, back in [place], has her dog, and overall content at this time   Medical non-compliance Ongoing major barrier to improved health, many opportunities to change this have been offered in the past   Morbid obesity Deteriorated. Patient re-educated about  the importance of commitment to a  minimum of 150 minutes of exercise per week.  The importance of healthy food choices with portion control discussed. Encouraged to start a food diary, count calories and to consider  joining a support group. Sample diet sheets offered. Goals set by the patient for the next several months.   Weight /BMI 01/22/2015 10/29/2014 10/01/2014  WEIGHT 192 lb 184 lb 6.4 oz 182 lb 12.8 oz  HEIGHT 5\' 1"  5\' 1"  5\' 1"   BMI 36.3 kg/m2 34.86 kg/m2 34.56 kg/m2  Some encounter information is confidential and restricted. Go to Review Flowsheets activity to see all data.    Current exercise per week 30 minutes.    COPD exacerbation Medications prescribed and nicotine cessation encouraged

## 2015-02-16 NOTE — Assessment & Plan Note (Signed)
  Improved, treated by endo. Patient educated about the importance of limiting  Carbohydrate intake , the need to commit to daily physical activity for a minimum of 30 minutes , and to commit weight loss. The fact that changes in all these areas will improve control of her diabetes and reduce complications of the disease   Diabetic Labs Latest Ref Rng 01/22/2015 10/01/2014 09/13/2014 09/12/2014 09/11/2014  HbA1c <5.7 % 7.0(H) - - - -  Microalbumin 0.00 - 1.89 mg/dL - - - - -  Micro/Creat Ratio 0.0 - 30.0 mg/g - - - - -  Chol 0 - 200 mg/dL 236(H) - - - -  HDL >=46 mg/dL 41(L) - - - -  Calc LDL 0 - 99 mg/dL 144(H) - - - -  Triglycerides <150 mg/dL 255(H) - - - -  Creatinine 0.50 - 1.10 mg/dL 1.15(H) 1.3(H) 1.91(H) 2.44(H) 2.78(H)  GFR >60.00 mL/min - 54.70(L) - - -   BP/Weight 01/22/2015 10/29/2014 10/01/2014 09/13/2014 09/11/2014 09/10/2014 06/03/9832  Systolic BP 825 053 976 734 - 98 193  Diastolic BP 790 240 92 85 - 72 100  Wt. (Lbs) 192 184.4 182.8 - 177.03 170.12 183.12  BMI 36.3 34.86 34.56 - 33.47 32.16 34.62  Some encounter information is confidential and restricted. Go to Review Flowsheets activity to see all data.   Foot/eye exam completion dates 10/01/2014 09/10/2014  Foot exam Order - -  Foot Form Completion Done Done

## 2015-02-16 NOTE — Assessment & Plan Note (Signed)
Ongoing major barrier to improved health, many opportunities to change this have been offered in the past

## 2015-02-16 NOTE — Assessment & Plan Note (Signed)
Deteriorated. Patient re-educated about  the importance of commitment to a  minimum of 150 minutes of exercise per week.  The importance of healthy food choices with portion control discussed. Encouraged to start a food diary, count calories and to consider  joining a support group. Sample diet sheets offered. Goals set by the patient for the next several months.   Weight /BMI 01/22/2015 10/29/2014 10/01/2014  WEIGHT 192 lb 184 lb 6.4 oz 182 lb 12.8 oz  HEIGHT 5\' 1"  5\' 1"  5\' 1"   BMI 36.3 kg/m2 34.86 kg/m2 34.56 kg/m2  Some encounter information is confidential and restricted. Go to Review Flowsheets activity to see all data.    Current exercise per week 30 minutes.

## 2015-02-16 NOTE — Assessment & Plan Note (Signed)
Deteriorated Patient counseled for approximately 5 minutes regarding the health risks of ongoing nicotine use, specifically all types of cancer, heart disease, stroke and respiratory failure. The options available for help with cessation ,the behavioral changes to assist the process, and the option to either gradully reduce usage  Or abruptly stop.is also discussed. Pt is also encouraged to set specific goals in number of cigarettes used daily, as well as to set a quit date.  Number of cigarettes/cigars currently smoking daily: 20

## 2015-02-16 NOTE — Assessment & Plan Note (Signed)
Uncontrolled due to non compliance, re educated re the need for compliance, and the fact that sh is increasing her risk of recurrent CVa, and other risks of uncontrolled HTN  DASH diet and commitment to daily physical activity for a minimum of 30 minutes discussed and encouraged, as a part of hypertension management. The importance of attaining a healthy weight is also discussed.  BP/Weight 01/22/2015 10/29/2014 10/01/2014 09/13/2014 09/11/2014 09/10/2014 5/85/9292  Systolic BP 446 286 381 771 - 98 165  Diastolic BP 790 383 92 85 - 72 100  Wt. (Lbs) 192 184.4 182.8 - 177.03 170.12 183.12  BMI 36.3 34.86 34.56 - 33.47 32.16 34.62  Some encounter information is confidential and restricted. Go to Review Flowsheets activity to see all data.

## 2015-02-16 NOTE — Assessment & Plan Note (Signed)
Being treated by mental health and denies suicidal or homicidal ideation currently, living independently, back in [place], has her dog, and overall content at this time

## 2015-02-20 ENCOUNTER — Ambulatory Visit: Payer: Medicaid Other | Admitting: Family Medicine

## 2015-02-20 ENCOUNTER — Encounter: Payer: Self-pay | Admitting: Family Medicine

## 2015-03-11 ENCOUNTER — Encounter: Payer: Self-pay | Admitting: Family Medicine

## 2015-03-11 ENCOUNTER — Ambulatory Visit (INDEPENDENT_AMBULATORY_CARE_PROVIDER_SITE_OTHER): Payer: Medicare Other | Admitting: Family Medicine

## 2015-03-11 ENCOUNTER — Telehealth: Payer: Self-pay | Admitting: Family Medicine

## 2015-03-11 ENCOUNTER — Other Ambulatory Visit: Payer: Self-pay | Admitting: Family Medicine

## 2015-03-11 VITALS — BP 120/90 | HR 94 | Resp 16 | Ht 61.0 in | Wt 189.0 lb

## 2015-03-11 DIAGNOSIS — F17210 Nicotine dependence, cigarettes, uncomplicated: Secondary | ICD-10-CM | POA: Diagnosis not present

## 2015-03-11 DIAGNOSIS — I10 Essential (primary) hypertension: Secondary | ICD-10-CM

## 2015-03-11 DIAGNOSIS — F1910 Other psychoactive substance abuse, uncomplicated: Secondary | ICD-10-CM

## 2015-03-11 DIAGNOSIS — E1169 Type 2 diabetes mellitus with other specified complication: Secondary | ICD-10-CM

## 2015-03-11 DIAGNOSIS — L299 Pruritus, unspecified: Secondary | ICD-10-CM

## 2015-03-11 DIAGNOSIS — E1065 Type 1 diabetes mellitus with hyperglycemia: Secondary | ICD-10-CM | POA: Diagnosis not present

## 2015-03-11 DIAGNOSIS — F17219 Nicotine dependence, cigarettes, with unspecified nicotine-induced disorders: Secondary | ICD-10-CM | POA: Diagnosis not present

## 2015-03-11 DIAGNOSIS — E1140 Type 2 diabetes mellitus with diabetic neuropathy, unspecified: Secondary | ICD-10-CM

## 2015-03-11 DIAGNOSIS — E669 Obesity, unspecified: Secondary | ICD-10-CM

## 2015-03-11 DIAGNOSIS — E119 Type 2 diabetes mellitus without complications: Secondary | ICD-10-CM

## 2015-03-11 DIAGNOSIS — E559 Vitamin D deficiency, unspecified: Secondary | ICD-10-CM | POA: Diagnosis not present

## 2015-03-11 DIAGNOSIS — F411 Generalized anxiety disorder: Secondary | ICD-10-CM

## 2015-03-11 DIAGNOSIS — T7840XA Allergy, unspecified, initial encounter: Secondary | ICD-10-CM

## 2015-03-11 LAB — GLUCOSE, POCT (MANUAL RESULT ENTRY): POC Glucose: 161 mg/dl — AB (ref 70–99)

## 2015-03-11 MED ORDER — HYDROXYZINE HCL 50 MG PO TABS: ORAL_TABLET | ORAL | Status: DC

## 2015-03-11 MED ORDER — METHYLPREDNISOLONE ACETATE 80 MG/ML IJ SUSP
80.0000 mg | Freq: Once | INTRAMUSCULAR | Status: AC
  Administered 2015-03-11: 80 mg via INTRAMUSCULAR

## 2015-03-11 MED ORDER — PREDNISONE 5 MG PO TABS: 5.0000 mg | ORAL_TABLET | Freq: Two times a day (BID) | ORAL | Status: AC

## 2015-03-11 MED ORDER — ERGOCALCIFEROL 1.25 MG (50000 UT) PO CAPS: 50000.0000 [IU] | ORAL_CAPSULE | ORAL | Status: DC

## 2015-03-11 NOTE — Assessment & Plan Note (Signed)
Improved, markedly, diastolic still elevated, good SBP, no med change DASH diet and commitment to daily physical activity for a minimum of 30 minutes discussed and encouraged, as a part of hypertension management. The importance of attaining a healthy weight is also discussed.  BP/Weight 03/11/2015 01/22/2015 10/29/2014 10/01/2014 09/13/2014 09/11/2014 50/12/7046  Systolic BP 889 169 450 388 828 - 98  Diastolic BP 90 003 491 92 85 - 72  Wt. (Lbs) 189 192 184.4 182.8 - 177.03 170.12  BMI 35.73 36.3 34.86 34.56 - 33.47 32.16  Some encounter information is confidential and restricted. Go to Review Flowsheets activity to see all data.

## 2015-03-11 NOTE — Patient Instructions (Addendum)
Annual wellness in August  As before, call if you need  Me sooner  Schedule annual physical exam for November before you leave  You have allergic reaction today in office, depo medrol administered and 5 day course of prednisone is prescribed  New is vit D once weekly  STAY drug free  Eat a lot of vegetables and some fruit and drink water, join silver sneaker program and plan to exercise 5 days per week  No retained tick on exam, no sore,hydroxyzine tablet at night will help itjh and with sleep  Use vaseline twice daily to area for 5 days then stop  Fasting lipid, cmp, HBA1C, CBC, microalb in August  New meter  We will call re nicotine replacement  Good you want to quit need to

## 2015-03-11 NOTE — Assessment & Plan Note (Signed)
Improved Patient educated about the importance of limiting  Carbohydrate intake , the need to commit to daily physical activity for a minimum of 30 minutes , and to commit weight loss.   Diabetic Labs Latest Ref Rng 01/22/2015 10/01/2014 09/13/2014 09/12/2014 09/11/2014  HbA1c <5.7 % 7.0(H) - - - -  Microalbumin 0.00 - 1.89 mg/dL - - - - -  Micro/Creat Ratio 0.0 - 30.0 mg/g - - - - -  Chol 0 - 200 mg/dL 236(H) - - - -  HDL >=46 mg/dL 41(L) - - - -  Calc LDL 0 - 99 mg/dL 144(H) - - - -  Triglycerides <150 mg/dL 255(H) - - - -  Creatinine 0.50 - 1.10 mg/dL 1.15(H) 1.3(H) 1.91(H) 2.44(H) 2.78(H)  GFR >60.00 mL/min - 54.70(L) - - -   BP/Weight 03/11/2015 01/22/2015 10/29/2014 10/01/2014 09/13/2014 09/11/2014 00/86/7619  Systolic BP 509 326 712 458 099 - 98  Diastolic BP 90 833 825 92 85 - 72  Wt. (Lbs) 189 192 184.4 182.8 - 177.03 170.12  BMI 35.73 36.3 34.86 34.56 - 33.47 32.16  Some encounter information is confidential and restricted. Go to Review Flowsheets activity to see all data.   Foot/eye exam completion dates 10/01/2014 09/10/2014  Foot exam Order - -  Foot Form Completion Done Done  Updated lab needed at/ before next visit.

## 2015-03-11 NOTE — Assessment & Plan Note (Signed)

## 2015-03-11 NOTE — Assessment & Plan Note (Signed)
Improved and controlled on current medication , no change at this time

## 2015-03-11 NOTE — Assessment & Plan Note (Signed)
Unchnaged Patient re-educated about  the importance of commitment to a  minimum of 150 minutes of exercise per week.  The importance of healthy food choices with portion control discussed. Encouraged to start a food diary, count calories and to consider  joining a support group. Sample diet sheets offered. Goals set by the patient for the next several months.   Weight /BMI 03/11/2015 01/22/2015 10/29/2014  WEIGHT 189 lb 192 lb 184 lb 6.4 oz  HEIGHT 5\' 1"  5\' 1"  5\' 1"   BMI 35.73 kg/m2 36.3 kg/m2 34.86 kg/m2  Some encounter information is confidential and restricted. Go to Review Flowsheets activity to see all data.    Current exercise per week *60** minutes.

## 2015-03-11 NOTE — Progress Notes (Signed)
Subjective:    Patient ID: [name], female    DOB: Nov 25, 1965, 49 y.o.   MRN: [id number]  HPI The PT is here for follow up and re-evaluation of chronic medical conditions, medication management and review of any available recent lab and radiology data.  Preventive health is updated, specifically  Cancer screening and Immunization.    The PT denies any adverse reactions to current medications since the last visit.  Recently removed a tick from her abdomen, still has itch in area and wants this checked, denies chills, body aches  Or fever   Wants to quit smoking and has cut back alot, needs patches Denies polyuria, polydipsia, blurred vision , or hypoglycemic episodes.     Review of Systems See HPI Denies recent fever or chills. Denies sinus pressure, nasal congestion, ear pain or sore throat. Denies chest congestion, productive cough or wheezing. Denies chest pains, palpitations and leg swelling Denies abdominal pain, nausea, vomiting,diarrhea or constipation.   Denies dysuria, frequency, hesitancy or incontinence. Chronic back pain  and limitation in mobility. Denies headaches, seizures, numbness, or tingling. Denies uncontrolled  depression, anxiety or insomnia. Denies skin break down or rash.        Objective:   Physical Exam BP 120/90 mmHg  Pulse 94  Resp 16  Ht 5\' 1"  (1.549 m)  Wt 189 lb (85.73 kg)  BMI 35.73 kg/m2  SpO2 100% Patient alert and oriented and in no cardiopulmonary distress.  HEENT: No facial asymmetry, EOMI,   oropharynx pink and moist.  Neck supple no JVD, no mass. lowr lip swollen, no compromise of oropharyngeal area  Chest: Clear to auscultation bilaterally.  CVS: S1, S2 no murmurs, no S3.Regular rate.  ABD: Soft non tender.   Ext: No edema  MS: Adequate ROM spine, shoulders, hips and knees.  Skin: Intact, healed area in region of concern where tick recently removed, no erythema , warmth or drainage.  Psych: Good eye contact,  normal affect. Memory intact not anxious or depressed appearing.  CNS: CN 2-12 intact, power,  normal throughout.no focal deficits noted.        Assessment & Plan:  Allergic reaction Likely from lipstick used, depo medrol in office then 5 days of prednisone   Cigarette nicotine dependence with nicotine-induced disorder Patient counseled for approximately 5 minutes regarding the health risks of ongoing nicotine use, specifically all types of cancer, heart disease, stroke and respiratory failure. The options available for help with cessation ,the behavioral changes to assist the process, and the option to either gradully reduce usage  Or abruptly stop.is also discussed. Pt is also encouraged to set specific goals in number of cigarettes used daily, as well as to set a quit date.  Number of cigarettes/cigars currently smoking daily: 10  Quit date is 05/11/2015    HTN (hypertension), malignant Improved, markedly, diastolic still elevated, good SBP, no med change DASH diet and commitment to daily physical activity for a minimum of 30 minutes discussed and encouraged, as a part of hypertension management. The importance of attaining a healthy weight is also discussed.  BP/Weight 03/11/2015 01/22/2015 10/29/2014 10/01/2014 09/13/2014 09/11/2014 93/81/0175  Systolic BP 102 585 277 824 235 - 98  Diastolic BP 90 361 443 92 85 - 72  Wt. (Lbs) 189 192 184.4 182.8 - 177.03 170.12  BMI 35.73 36.3 34.86 34.56 - 33.47 32.16  Some encounter information is confidential and restricted. Go to Review Flowsheets activity to see all data.  Diabetes mellitus type 2 in obese Improved Patient educated about the importance of limiting  Carbohydrate intake , the need to commit to daily physical activity for a minimum of 30 minutes , and to commit weight loss.   Diabetic Labs Latest Ref Rng 01/22/2015 10/01/2014 09/13/2014 09/12/2014 09/11/2014  HbA1c <5.7 % 7.0(H) - - - -  Microalbumin 0.00 -  1.89 mg/dL - - - - -  Micro/Creat Ratio 0.0 - 30.0 mg/g - - - - -  Chol 0 - 200 mg/dL 236(H) - - - -  HDL >=46 mg/dL 41(L) - - - -  Calc LDL 0 - 99 mg/dL 144(H) - - - -  Triglycerides <150 mg/dL 255(H) - - - -  Creatinine 0.50 - 1.10 mg/dL 1.15(H) 1.3(H) 1.91(H) 2.44(H) 2.78(H)  GFR >60.00 mL/min - 54.70(L) - - -   BP/Weight 03/11/2015 01/22/2015 10/29/2014 10/01/2014 09/13/2014 09/11/2014 03/00/9233  Systolic BP 007 622 633 354 562 - 98  Diastolic BP 90 563 893 92 85 - 72  Wt. (Lbs) 189 192 184.4 182.8 - 177.03 170.12  BMI 35.73 36.3 34.86 34.56 - 33.47 32.16  Some encounter information is confidential and restricted. Go to Review Flowsheets activity to see all data.   Foot/eye exam completion dates 10/01/2014 09/10/2014  Foot exam Order - -  Foot Form Completion Done Done  Updated lab needed at/ before next visit.       Morbid obesity Unchnaged Patient re-educated about  the importance of commitment to a  minimum of 150 minutes of exercise per week.  The importance of healthy food choices with portion control discussed. Encouraged to start a food diary, count calories and to consider  joining a support group. Sample diet sheets offered. Goals set by the patient for the next several months.   Weight /BMI 03/11/2015 01/22/2015 10/29/2014  WEIGHT 189 lb 192 lb 184 lb 6.4 oz  HEIGHT 5\' 1"  5\' 1"  5\' 1"   BMI 35.73 kg/m2 36.3 kg/m2 34.86 kg/m2  Some encounter information is confidential and restricted. Go to Review Flowsheets activity to see all data.    Current exercise per week *60** minutes.    GAD (generalized anxiety disorder) Improved and controlled on current medication , no change at this time   Itching localized itch in area of recent tick bite , which is not infected. Also reports poor sleep add  hydoxyzine 50 mg at bedtime   Polysubstance abuse denies any current illicit drug use, wants to quit nicotine also

## 2015-03-11 NOTE — Assessment & Plan Note (Signed)
localized itch in area of recent tick bite , which is not infected. Also reports poor sleep add  hydoxyzine 50 mg at bedtime

## 2015-03-11 NOTE — Assessment & Plan Note (Signed)
Likely from lipstick used, depo medrol in office then 5 days of prednisone

## 2015-03-11 NOTE — Assessment & Plan Note (Addendum)
denies any current illicit drug use, wants to quit nicotine also

## 2015-03-13 DIAGNOSIS — I509 Heart failure, unspecified: Secondary | ICD-10-CM | POA: Diagnosis not present

## 2015-03-13 NOTE — Telephone Encounter (Signed)
Medicare part D paid for her patches

## 2015-03-15 ENCOUNTER — Other Ambulatory Visit: Payer: Self-pay | Admitting: Family Medicine

## 2015-03-26 LAB — HM DIABETES EYE EXAM

## 2015-04-03 ENCOUNTER — Emergency Department (HOSPITAL_COMMUNITY)
Admission: EM | Admit: 2015-04-03 | Discharge: 2015-04-04 | Disposition: A | Discharge status: Home / Self Care | Payer: Medicare Other | Attending: Emergency Medicine | Admitting: Emergency Medicine

## 2015-04-03 ENCOUNTER — Encounter (HOSPITAL_COMMUNITY): Payer: Self-pay

## 2015-04-03 ENCOUNTER — Emergency Department (HOSPITAL_COMMUNITY): Payer: Medicare Other

## 2015-04-03 DIAGNOSIS — I1310 Hypertensive heart and chronic kidney disease without heart failure, with stage 1 through stage 4 chronic kidney disease, or unspecified chronic kidney disease: Secondary | ICD-10-CM | POA: Insufficient documentation

## 2015-04-03 DIAGNOSIS — R05 Cough: Secondary | ICD-10-CM | POA: Insufficient documentation

## 2015-04-03 DIAGNOSIS — E669 Obesity, unspecified: Secondary | ICD-10-CM | POA: Diagnosis not present

## 2015-04-03 DIAGNOSIS — E785 Hyperlipidemia, unspecified: Secondary | ICD-10-CM | POA: Insufficient documentation

## 2015-04-03 DIAGNOSIS — Z9119 Patient's noncompliance with other medical treatment and regimen: Secondary | ICD-10-CM | POA: Insufficient documentation

## 2015-04-03 DIAGNOSIS — Z7951 Long term (current) use of inhaled steroids: Secondary | ICD-10-CM | POA: Insufficient documentation

## 2015-04-03 DIAGNOSIS — Z7902 Long term (current) use of antithrombotics/antiplatelets: Secondary | ICD-10-CM | POA: Insufficient documentation

## 2015-04-03 DIAGNOSIS — J449 Chronic obstructive pulmonary disease, unspecified: Secondary | ICD-10-CM | POA: Diagnosis not present

## 2015-04-03 DIAGNOSIS — N189 Chronic kidney disease, unspecified: Secondary | ICD-10-CM | POA: Diagnosis not present

## 2015-04-03 DIAGNOSIS — Z720 Tobacco use: Secondary | ICD-10-CM | POA: Insufficient documentation

## 2015-04-03 DIAGNOSIS — Z79899 Other long term (current) drug therapy: Secondary | ICD-10-CM | POA: Insufficient documentation

## 2015-04-03 DIAGNOSIS — E11610 Type 2 diabetes mellitus with diabetic neuropathic arthropathy: Secondary | ICD-10-CM | POA: Diagnosis not present

## 2015-04-03 DIAGNOSIS — Z8719 Personal history of other diseases of the digestive system: Secondary | ICD-10-CM | POA: Insufficient documentation

## 2015-04-03 DIAGNOSIS — F410 Panic disorder [episodic paroxysmal anxiety] without agoraphobia: Secondary | ICD-10-CM | POA: Diagnosis not present

## 2015-04-03 DIAGNOSIS — E876 Hypokalemia: Secondary | ICD-10-CM

## 2015-04-03 DIAGNOSIS — R51 Headache: Secondary | ICD-10-CM | POA: Insufficient documentation

## 2015-04-03 DIAGNOSIS — R030 Elevated blood-pressure reading, without diagnosis of hypertension: Secondary | ICD-10-CM | POA: Diagnosis not present

## 2015-04-03 DIAGNOSIS — I10 Essential (primary) hypertension: Secondary | ICD-10-CM | POA: Diagnosis not present

## 2015-04-03 DIAGNOSIS — Z8781 Personal history of (healed) traumatic fracture: Secondary | ICD-10-CM | POA: Diagnosis not present

## 2015-04-03 LAB — CBC WITH DIFFERENTIAL/PLATELET
Basophils Absolute: 0 10*3/uL (ref 0.0–0.1)
Basophils Relative: 1 % (ref 0–1)
Eosinophils Absolute: 0.2 10*3/uL (ref 0.0–0.7)
Eosinophils Relative: 3 % (ref 0–5)
HCT: 47.2 % — ABNORMAL HIGH (ref 36.0–46.0)
Hemoglobin: 16.3 g/dL — ABNORMAL HIGH (ref 12.0–15.0)
Lymphocytes Relative: 30 % (ref 12–46)
Lymphs Abs: 1.9 10*3/uL (ref 0.7–4.0)
MCH: 33.4 pg (ref 26.0–34.0)
MCHC: 34.5 g/dL (ref 30.0–36.0)
MCV: 96.7 fL (ref 78.0–100.0)
Monocytes Absolute: 0.4 10*3/uL (ref 0.1–1.0)
Monocytes Relative: 6 % (ref 3–12)
Neutro Abs: 3.9 10*3/uL (ref 1.7–7.7)
Neutrophils Relative %: 60 % (ref 43–77)
Platelets: 155 10*3/uL (ref 150–400)
RBC: 4.88 MIL/uL (ref 3.87–5.11)
RDW: 14 % (ref 11.5–15.5)
WBC: 6.3 10*3/uL (ref 4.0–10.5)

## 2015-04-03 LAB — ETHANOL: Alcohol, Ethyl (B): 16 mg/dL — ABNORMAL HIGH (ref <5)

## 2015-04-03 MED ORDER — METOPROLOL TARTRATE 25 MG PO TABS
12.5000 mg | ORAL_TABLET | Freq: Two times a day (BID) | ORAL | Status: DC
  Administered 2015-04-03: 12.5 mg via ORAL
  Filled 2015-04-03: qty 1

## 2015-04-03 MED ORDER — IPRATROPIUM-ALBUTEROL 0.5-2.5 (3) MG/3ML IN SOLN
3.0000 mL | Freq: Once | RESPIRATORY_TRACT | Status: AC
  Administered 2015-04-03: 3 mL via RESPIRATORY_TRACT
  Filled 2015-04-03: qty 3

## 2015-04-03 NOTE — ED Provider Notes (Addendum)
CSN: [id number]     Arrival date & time 04/03/15  2245 History  This chart was scribed for Ezequiel Essex, MD by Randa Evens, ED Scribe. This patient was seen in room APA14/APA14 and the patient's care was started at 11:14 PM.      Chief Complaint  Patient presents with  . Hypertension   HPI HPI Comments: [name] is a 49 y.o. female with PMHx HTN, DM and COPD brought in by ambulance, who presents to the Emergency Department complaining of SOB onset tonight 2 hours PTA. Pt state she has had associated cough. Pt states she was not able to get home to use her inhaler so she decided to call EMS. Pt also states that her blood pressure is elevated. Pt is complain of gradual throbbing posterior HA that began 2 hours prior. Pt states that she is complaint with taking all her blood pressure medications except for the dose she missed tonight. Pt denies CP, abdominal pain, blurred vision, n/v/d or dizziness. Pt states that she is still a current every day smoker. Pt denies any ilicit drug use.   Past Medical History  Diagnosis Date  . Alcohol use     Now abstinent.  . Diabetes mellitus, type 2   . Obesity   . Hyperlipidemia   . Hypertension   . Anxiety   . Panic attacks   . Hypercholesteremia   . Diabetic Charcot foot 12/30/2011  . RECTAL BLEEDING, HX OF 12/05/2009    Qualifier: Diagnosis of  By: Zeb Comfort    . Ankle fracture, lateral malleolus, closed 12/30/2011  . Breast mass, left 12/15/2011  . Tobacco abuse   . ANXIETY 04/17/2010    Qualifier: Diagnosis of  By: Claybon Jabs PA, Dawn    . Hypertensive cardiomyopathy 11/08/2012    Ejection fraction 40-45%.  . Diastolic dysfunction 12/09/863    Grade 1.  . CKD (chronic kidney disease)   . Noncompliance with medication regimen    Past Surgical History  Procedure Laterality Date  . Dilation and curettage of uterus    . Breast biopsy     Family History  Problem Relation Age of Onset  . Hypertension Mother   . Hypertension Father      CABG   . Hypertension Sister   . Heart attack Mother   . Arthritis    . Cancer    . Diabetes    . Asthma     History  Substance Use Topics  . Smoking status: Current Every Day Smoker -- 0.50 packs/day for 30 years    Types: Cigarettes  . Smokeless tobacco: Never Used  . Alcohol Use: 0.0 oz/week    0 Standard drinks or equivalent per week     Comment: wine and mickeys    OB History    No data available     Review of Systems  Eyes: Negative for visual disturbance.  Respiratory: Positive for cough and shortness of breath.   Cardiovascular: Negative for chest pain.  Neurological: Positive for headaches.  All other systems reviewed and are negative.    Allergies  Review of patient's allergies indicates no known allergies.  Home Medications   Prior to Admission medications   Medication Sig Start Date End Date Taking? Authorizing Provider  benazepril (LOTENSIN) 40 MG tablet Take 40 mg by mouth daily.    Historical Provider, MD  budesonide-formoterol (SYMBICORT) 80-4.5 MCG/ACT inhaler Inhale 2 puffs into the lungs 2 (two) times daily. 01/22/15   [name], MD  clopidogrel (PLAVIX) 75 MG tablet Take 1 tablet (75 mg total) by mouth daily with breakfast. 10/12/14   [name], MD  ergocalciferol (VITAMIN D2) 50000 UNITS capsule Take 1 capsule (50,000 Units total) by mouth once a week. One capsule once weekly 03/11/15   [name], MD  ezetimibe (ZETIA) 10 MG tablet Take 1 tablet (10 mg total) by mouth daily. 01/29/15   [name], MD  FLUoxetine (PROZAC) 20 MG capsule Take 1 capsule (20 mg total) by mouth daily. 10/12/14   [name], MD  fluticasone (FLONASE) 50 MCG/ACT nasal spray Place 2 sprays into both nostrils daily. 01/22/15   [name], MD  furosemide (LASIX) 40 MG tablet Take 40 mg by mouth daily.    Historical Provider, MD  gabapentin (NEURONTIN) 100 MG capsule Take 100 mg by mouth 3 (three) times daily.    Historical  Provider, MD  hydrOXYzine (ATARAX/VISTARIL) 50 MG tablet One tablet at bedtime 03/11/15   [name], MD  metoprolol tartrate (LOPRESSOR) 25 MG tablet Take 0.5 tablets (12.5 mg total) by mouth 2 (two) times daily. 10/12/14   [name], MD  montelukast (SINGULAIR) 10 MG tablet TAKE (1) TABLET BY MOUTH AT BEDTIME. 03/15/15   [name], MD  pantoprazole (PROTONIX) 40 MG tablet Take 1 tablet (40 mg total) by mouth daily. 10/12/14   [name], MD  potassium chloride SA (K-DUR,KLOR-CON) 20 MEQ tablet Take 30 mEq by mouth daily.    Historical Provider, MD  PROAIR HFA 108 (90 BASE) MCG/ACT inhaler INHALE 2 PUFFS EVERY 6 HOURS AS NEEDED FOR SHORTNESS OF BREATH/WHEEZING. 03/11/15   [name], MD  repaglinide (PRANDIN) 1 MG tablet Take 1 tablet (1 mg total) by mouth 3 (three) times daily before meals. 01/10/15   Elayne Snare, MD  rosuvastatin (CRESTOR) 40 MG tablet Take 1 tablet (40 mg total) by mouth daily. 10/12/14   [name], MD  spironolactone (ALDACTONE) 25 MG tablet Take 25 mg by mouth daily.    Historical Provider, MD  tizanidine (ZANAFLEX) 2 MG capsule Take 1 capsule (2 mg total) by mouth 2 (two) times daily. 01/30/15   [name], MD  TRADJENTA 5 MG TABS tablet TAKE 1 TABLET BY MOUTH ONCE A DAY. 03/15/15   [name], MD  traMADol (ULTRAM) 50 MG tablet TAKE 1 TABLET BY MOUTH TWICE A DAY. 02/14/15   [name], MD  traZODone (DESYREL) 50 MG tablet Take 1 tablet (50 mg total) by mouth at bedtime and may repeat dose one time if needed. 10/12/14   [name], MD  triamterene-hydrochlorothiazide (MAXZIDE-25) 37.5-25 MG per tablet Take 1 tablet by mouth daily.    Historical Provider, MD   BP 168/102 mmHg  Pulse 88  Temp(Src) 98 F (36.7 C) (Oral)  Resp 20  Ht 5\' 1"  (1.549 m)  Wt 186 lb (84.369 kg)  BMI 35.16 kg/m2  SpO2 96%   Physical Exam  Constitutional: She is oriented to person, place, and time. She appears  well-developed and well-nourished. No distress.  HENT:  Head: Normocephalic and atraumatic.  Mouth/Throat: Oropharynx is clear and moist. No oropharyngeal exudate.  Eyes: Conjunctivae and EOM are normal. Pupils are equal, round, and reactive to light.  Neck: Normal range of motion. Neck supple.  No meningismus.  Cardiovascular: Normal rate, regular rhythm, normal heart sounds and intact distal pulses.   No murmur heard. Pulmonary/Chest: Effort normal and breath sounds normal. No  respiratory distress. She has no wheezes.  Abdominal: Soft. There is no tenderness. There is no rebound and no guarding.  Musculoskeletal: Normal range of motion. She exhibits no edema or tenderness.  charcot's defomity left foot.   Neurological: She is alert and oriented to person, place, and time. No cranial nerve deficit. She exhibits normal muscle tone. Coordination normal.  No ataxia on finger to nose bilaterally. No pronator drift. 5/5 strength throughout. CN 2-12 intact. Equal grip strength. Sensation intact.   Skin: Skin is warm.  Psychiatric: She has a normal mood and affect. Her behavior is normal.  Nursing note and vitals reviewed.   ED Course  Procedures (including critical care time) DIAGNOSTIC STUDIES: Oxygen Saturation is 100% on RA, normal by my interpretation.    COORDINATION OF CARE: 11:27 PM-Discussed treatment plan with pt at bedside and pt agreed to plan.     Labs Review Labs Reviewed  CBC WITH DIFFERENTIAL/PLATELET - Abnormal; Notable for the following:    Hemoglobin 16.3 (*)    HCT 47.2 (*)    All other components within normal limits  COMPREHENSIVE METABOLIC PANEL - Abnormal; Notable for the following:    Potassium 2.7 (*)    Glucose, Bld 159 (*)    Creatinine, Ser 1.14 (*)    Albumin 3.4 (*)    GFR calc non Af Amer 55 (*)    All other components within normal limits  BRAIN NATRIURETIC PEPTIDE - Abnormal; Notable for the following:    B Natriuretic Peptide 101.0 (*)    All  other components within normal limits  ETHANOL - Abnormal; Notable for the following:    Alcohol, Ethyl (B) 16 (*)    All other components within normal limits  TROPONIN I  TROPONIN I    Imaging Review Dg Chest 2 View  04/04/2015   CLINICAL DATA:  Hypertension, headache this afternoon. History of diabetes, hypertension.  EXAM: CHEST  2 VIEW  COMPARISON:  Chest radiograph September 11, 2014  FINDINGS: Cardiac silhouette is upper limits of normal in size. Tortuous aorta can be seen with chronic hypertension. No pleural effusion or focal consolidation. No pneumothorax. Large body habitus. Mild degenerative change of the thoracic spine.  IMPRESSION: Borderline cardiomegaly, no acute pulmonary process.   Electronically Signed   By: Elon Alas M.D.   On: 04/04/2015 00:49   Ct Head Wo Contrast  04/04/2015   CLINICAL DATA:  Headache, hypertension this afternoon. History of diabetes, hypertension, hyperlipidemia.  EXAM: CT HEAD WITHOUT CONTRAST  TECHNIQUE: Contiguous axial images were obtained from the base of the skull through the vertex without intravenous contrast.  COMPARISON:  CT head Mar 17, 2014  FINDINGS: The ventricles and sulci are normal. No intraparenchymal hemorrhage, mass effect nor midline shift. No acute large vascular territory infarcts. Remote LEFT thalamus lacunar infarct. Subcentimeter hypodensity LEFT caudate head may have been present on prior imaging. Patchy white matter hypodensities are advanced for age though, similar to prior CT. Subcentimeter density within the LEFT frontal extra-axial space was present on prior CT and may reflect a small meningioma or, prominent cortical vein. Mild calcific atherosclerosis of the carotid siphons.  No abnormal extra-axial fluid collections. Basal cisterns are patent.  No skull fracture. The included ocular globes and orbital contents are non-suspicious. Remote LEFT medial orbital blowout fracture. The mastoid aircells and included paranasal  sinuses are well-aerated.  IMPRESSION: No acute intracranial process.  Moderate white matter changes, advanced for age compatible chronic small vessel ischemic disease. Old LEFT thalamus  lacunar infarct. Likely old LEFT caudate head lacunar infarct.   Electronically Signed   By: Elon Alas M.D.   On: 04/04/2015 00:48     EKG Interpretation   Date/Time:  Thursday April 04 2015 03:07:06 EDT Ventricular Rate:  87 PR Interval:  126 QRS Duration: 92 QT Interval:  478 QTC Calculation: 575 R Axis:   -88 Text Interpretation:  Sinus rhythm Atrial premature complex Left anterior  fascicular block Anterior infarct, old Prolonged QT interval T waves now  upright Confirmed by [place] [phone]) on 04/04/2015 3:25:29 AM      MDM   Final diagnoses:  Essential hypertension  Hypokalemia   Patient drinking alcohol with son and felt short of breath about 2 hours ago. She did not have her medications so she called EMS. Her blood pressure was found to be high and she is not tonight. She denies any visual changes, focal weakness, numbness or tingling. Denies any chest pain.  EKG shows new T-wave inversions in lead V5, V6, leads 3 and aVF.  Labs remarkable for hypokalemia. Troponin negative. Patient given home blood pressure medication. She has no wheezing or coughing. Chest x-ray is benign.  EKG changes d/w cardiology fellow Dr. Clayborne Artist.  He agrees with serial troponins as patient has no chest pain and outpatient followup.  Troponin negative 2. Patient with no chest pain. Her blood pressure has improved to 168/102. EKG changes may be due to hypokalemia. This was repleted ED. She is on potassium chronically at home.  Repeat EKG shows upright T waves in V5. Biphasic T wave in V6. This is an improvement from previous.  Patient states she has her blood pressure medications and potassium at home. She feels back to baseline. She is not short of breath. Denies any chest pain. Discussed with  patient that she needs recheck of her potassium with her PCP this week. Referral to cardiology given as well.  Return precautions discussed.     I personally performed the services described in this documentation, which was scribed in my presence. The recorded information has been reviewed and is accurate.     Ezequiel Essex, MD 04/04/15 [place], MD 04/04/15 [phone]

## 2015-04-03 NOTE — ED Notes (Signed)
   04/03/15 2255  HEENT  HEENT (WDL) X  R Eye Conjunctiva red  L Eye Conjunctiva red  Head and Face Symmetrical  Pt c/o head throbbing and has been drinking alcohol and has not taken her b\p meds today.

## 2015-04-03 NOTE — ED Notes (Signed)
Pt admits to ems that she had been drinking tonight and when she couldn't get a ride home from the location she was at she called law enforcement to try to get a ride.  When that didn't work, she called ems to bring her to the hospital and stated her blood pressure was high.

## 2015-04-04 ENCOUNTER — Other Ambulatory Visit: Payer: Self-pay | Admitting: Family Medicine

## 2015-04-04 DIAGNOSIS — I1310 Hypertensive heart and chronic kidney disease without heart failure, with stage 1 through stage 4 chronic kidney disease, or unspecified chronic kidney disease: Secondary | ICD-10-CM | POA: Diagnosis not present

## 2015-04-04 LAB — COMPREHENSIVE METABOLIC PANEL
ALT: 19 U/L (ref 14–54)
AST: 18 U/L (ref 15–41)
Albumin: 3.4 g/dL — ABNORMAL LOW (ref 3.5–5.0)
Alkaline Phosphatase: 81 U/L (ref 38–126)
Anion gap: 12 (ref 5–15)
BUN: 13 mg/dL (ref 6–20)
CO2: 22 mmol/L (ref 22–32)
Calcium: 8.9 mg/dL (ref 8.9–10.3)
Chloride: 108 mmol/L (ref 101–111)
Creatinine, Ser: 1.14 mg/dL — ABNORMAL HIGH (ref 0.44–1.00)
GFR calc Af Amer: >60 mL/min (ref >60)
GFR calc non Af Amer: 55 mL/min — ABNORMAL LOW (ref >60)
Glucose, Bld: 159 mg/dL — ABNORMAL HIGH (ref 65–99)
Potassium: 2.7 mmol/L — CL (ref 3.5–5.1)
Sodium: 142 mmol/L (ref 135–145)
Total Bilirubin: 0.7 mg/dL (ref 0.3–1.2)
Total Protein: 6.8 g/dL (ref 6.5–8.1)

## 2015-04-04 LAB — TROPONIN I
Troponin I: 0.03 ng/mL (ref <0.031)
Troponin I: 0.03 ng/mL (ref <0.031)

## 2015-04-04 LAB — BRAIN NATRIURETIC PEPTIDE: B Natriuretic Peptide: 101 pg/mL — ABNORMAL HIGH (ref 0.0–100.0)

## 2015-04-04 MED ORDER — POTASSIUM CHLORIDE CRYS ER 20 MEQ PO TBCR
40.0000 meq | EXTENDED_RELEASE_TABLET | Freq: Once | ORAL | Status: AC
  Administered 2015-04-04: 40 meq via ORAL

## 2015-04-04 MED ORDER — POTASSIUM CHLORIDE CRYS ER 20 MEQ PO TBCR
EXTENDED_RELEASE_TABLET | ORAL | Status: AC
  Filled 2015-04-04: qty 2

## 2015-04-04 MED ORDER — HYDROMORPHONE HCL 1 MG/ML IJ SOLN: 0.5000 mg | Freq: Once | INTRAMUSCULAR | Status: DC

## 2015-04-04 MED ORDER — POTASSIUM CHLORIDE CRYS ER 20 MEQ PO TBCR
20.0000 meq | EXTENDED_RELEASE_TABLET | Freq: Once | ORAL | Status: AC
  Administered 2015-04-04: 20 meq via ORAL
  Filled 2015-04-04: qty 1

## 2015-04-04 MED ORDER — HYDRALAZINE HCL 20 MG/ML IJ SOLN
5.0000 mg | Freq: Once | INTRAMUSCULAR | Status: AC
  Administered 2015-04-04: 5 mg via INTRAVENOUS
  Filled 2015-04-04: qty 1

## 2015-04-04 NOTE — ED Notes (Signed)
CRITICAL VALUE ALERT  Critical value received:  Potassium 2.7  Date of notification:  04/04/15  Time of notification:  0008  Critical value read back:Yes.    Nurse who received alert:  Toma Deiters  MD notified (1st page):  Dr Wyvonnia Dusky  Time of first page:  0008  MD notified (2nd page):  Time of second page:  Responding MD:  Dr Wyvonnia Dusky  Time MD responded: [phone]

## 2015-04-04 NOTE — Discharge Instructions (Signed)
Hypertension Follow-up with your doctor this week for recheck of your potassium. Take your blood pressure medications as prescribed. Return to the ED with chest pain, shortness of breath, any other concerns. Hypertension, commonly called high blood pressure, is when the force of blood pumping through your arteries is too strong. Your arteries are the blood vessels that carry blood from your heart throughout your body. A blood pressure reading consists of a higher number over a lower number, such as 110/72. The higher number (systolic) is the pressure inside your arteries when your heart pumps. The lower number (diastolic) is the pressure inside your arteries when your heart relaxes. Ideally you want your blood pressure below 120/80. Hypertension forces your heart to work harder to pump blood. Your arteries may become narrow or stiff. Having hypertension puts you at risk for heart disease, stroke, and other problems.  RISK FACTORS Some risk factors for high blood pressure are controllable. Others are not.  Risk factors you cannot control include:   Race. You may be at higher risk if you are African American.  Age. Risk increases with age.  Gender. Men are at higher risk than women before age 70 years. After age 56, women are at higher risk than men. Risk factors you can control include:  Not getting enough exercise or physical activity.  Being overweight.  Getting too much fat, sugar, calories, or salt in your diet.  Drinking too much alcohol. SIGNS AND SYMPTOMS Hypertension does not usually cause signs or symptoms. Extremely high blood pressure (hypertensive crisis) may cause headache, anxiety, shortness of breath, and nosebleed. DIAGNOSIS  To check if you have hypertension, your health care provider will measure your blood pressure while you are seated, with your arm held at the level of your heart. It should be measured at least twice using the same arm. Certain conditions can cause a  difference in blood pressure between your right and left arms. A blood pressure reading that is higher than normal on one occasion does not mean that you need treatment. If one blood pressure reading is high, ask your health care provider about having it checked again. TREATMENT  Treating high blood pressure includes making lifestyle changes and possibly taking medicine. Living a healthy lifestyle can help lower high blood pressure. You may need to change some of your habits. Lifestyle changes may include:  Following the DASH diet. This diet is high in fruits, vegetables, and whole grains. It is low in salt, red meat, and added sugars.  Getting at least 2 hours of brisk physical activity every week.  Losing weight if necessary.  Not smoking.  Limiting alcoholic beverages.  Learning ways to reduce stress. If lifestyle changes are not enough to get your blood pressure under control, your health care provider may prescribe medicine. You may need to take more than one. Work closely with your health care provider to understand the risks and benefits. HOME CARE INSTRUCTIONS  Have your blood pressure rechecked as directed by your health care provider.   Take medicines only as directed by your health care provider. Follow the directions carefully. Blood pressure medicines must be taken as prescribed. The medicine does not work as well when you skip doses. Skipping doses also puts you at risk for problems.   Do not smoke.   Monitor your blood pressure at home as directed by your health care provider. SEEK MEDICAL CARE IF:   You think you are having a reaction to medicines taken.  You have recurrent  headaches or feel dizzy.  You have swelling in your ankles.  You have trouble with your vision. SEEK IMMEDIATE MEDICAL CARE IF:  You develop a severe headache or confusion.  You have unusual weakness, numbness, or feel faint.  You have severe chest or abdominal pain.  You vomit  repeatedly.  You have trouble breathing. MAKE SURE YOU:   Understand these instructions.  Will watch your condition.  Will get help right away if you are not doing well or get worse. Document Released: 10/12/2005 Document Revised: 02/26/2014 Document Reviewed: 08/04/2013 [facility] Patient Information 2015 [place], [place]. This information is not intended to replace advice given to you by your health care provider. Make sure you discuss any questions you have with your health care provider.

## 2015-04-08 ENCOUNTER — Other Ambulatory Visit: Payer: Self-pay | Admitting: Family Medicine

## 2015-04-13 DIAGNOSIS — I509 Heart failure, unspecified: Secondary | ICD-10-CM | POA: Diagnosis not present

## 2015-04-16 ENCOUNTER — Other Ambulatory Visit: Payer: Self-pay | Admitting: *Deleted

## 2015-04-16 ENCOUNTER — Telehealth: Payer: Self-pay | Admitting: Endocrinology

## 2015-04-16 MED ORDER — REPAGLINIDE 1 MG PO TABS: ORAL_TABLET | ORAL | Status: DC

## 2015-04-16 NOTE — Telephone Encounter (Signed)
[facility] called and need refill on prandin 2mg 

## 2015-04-25 ENCOUNTER — Telehealth: Payer: Self-pay | Admitting: Family Medicine

## 2015-04-25 DIAGNOSIS — M549 Dorsalgia, unspecified: Secondary | ICD-10-CM

## 2015-04-25 NOTE — Telephone Encounter (Signed)
X ray of back shows disc disease and she has lower ext sensory loss, I will order an MRI of her lumbar spine. Because of her medical history , she will need pain management through a [facility], I will refer her once all info is together, pls explain I have entered 2 ativan tabs for use if needed, pls call in

## 2015-04-25 NOTE — Telephone Encounter (Signed)
Patient states that she is having more pain in her legs and back.  She is asking for a stronger prescription for pain.  Aware that this will require an office visit.  Do you agree?

## 2015-04-26 ENCOUNTER — Telehealth: Payer: Self-pay | Admitting: Family Medicine

## 2015-04-26 NOTE — Telephone Encounter (Signed)
Appointment 7.14.16 at [facility] be there at 1:45 left message for patient to call back

## 2015-04-30 NOTE — Telephone Encounter (Signed)
Patient is aware 

## 2015-05-01 NOTE — Telephone Encounter (Signed)
Called and left message for patient to return call.  

## 2015-05-09 ENCOUNTER — Ambulatory Visit (HOSPITAL_COMMUNITY)
Admission: RE | Admit: 2015-05-09 | Discharge: 2015-05-09 | Disposition: A | Discharge status: Home / Self Care | Payer: Medicare Other | Source: Ambulatory Visit | Attending: Family Medicine | Admitting: Family Medicine

## 2015-05-09 DIAGNOSIS — M4806 Spinal stenosis, lumbar region: Secondary | ICD-10-CM | POA: Insufficient documentation

## 2015-05-09 DIAGNOSIS — M549 Dorsalgia, unspecified: Secondary | ICD-10-CM

## 2015-05-09 DIAGNOSIS — M5136 Other intervertebral disc degeneration, lumbar region: Secondary | ICD-10-CM | POA: Insufficient documentation

## 2015-05-09 DIAGNOSIS — M545 Low back pain: Secondary | ICD-10-CM | POA: Insufficient documentation

## 2015-05-13 DIAGNOSIS — I509 Heart failure, unspecified: Secondary | ICD-10-CM | POA: Diagnosis not present

## 2015-05-14 ENCOUNTER — Telehealth: Payer: Self-pay | Admitting: *Deleted

## 2015-05-14 ENCOUNTER — Other Ambulatory Visit: Payer: Self-pay

## 2015-05-14 MED ORDER — GABAPENTIN 100 MG PO CAPS: 100.0000 mg | ORAL_CAPSULE | Freq: Three times a day (TID) | ORAL | Status: DC

## 2015-05-14 NOTE — Telephone Encounter (Signed)
Pt called [name] to get the results of her MRI she had done on the 14th pt said her back is hurting her so bad and this message is urgent because she needs to know why her back is hurting. Please advise

## 2015-05-14 NOTE — Telephone Encounter (Signed)
Patient aware of results.

## 2015-05-31 ENCOUNTER — Other Ambulatory Visit: Payer: Self-pay | Admitting: Family Medicine

## 2015-06-10 IMAGING — MR MR HEAD W/O CM
6 of 11 series · 19 of 48 positions shown · non-contrast
Comparison: 11/17/2013 CT.  09/24/2004 MR.

CLINICAL DATA: Confusion. Altered mental status. Slurred speech.
Weakness since 10/30/2013. Right greater on left upper extremity is
some subtle lower extremity weakness. Recent episode of markedly
elevated blood pressure. Diabetic (poorly controlled) hypertensive
(poorly controlled) hyperlipidemia obese smoker.

EXAM:
MRI HEAD WITHOUT CONTRAST
MRA HEAD WITHOUT CONTRAST
TECHNIQUE: Multiplanar, multiecho pulse sequences of the brain and surrounding
structures were obtained without intravenous contrast. Angiographic
images of the head were obtained using MRA technique without
contrast.

[Series 2: T1 · sagittal · 5.0mm · 0.43mm/px · 2 of 20 slices shown (1 of 2)]
[im 1/20]
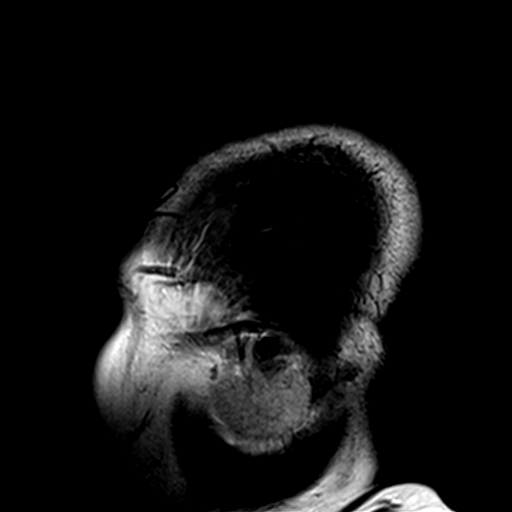
[im 20/20]
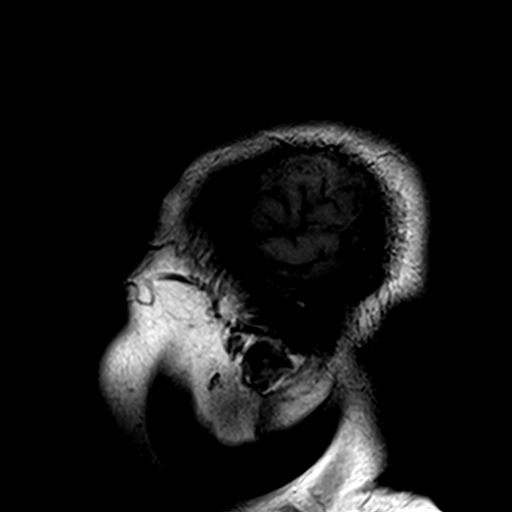

[Series 9: T2 · axial · 5.0mm · 0.51mm/px · z∈[-40,+103]mm · 2 of 23 slices shown (1 of 2)]
[im 1/23]
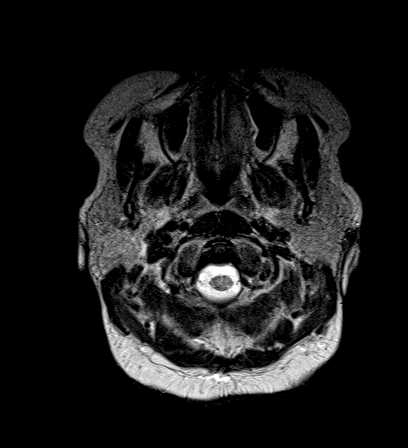
[im 23/23]
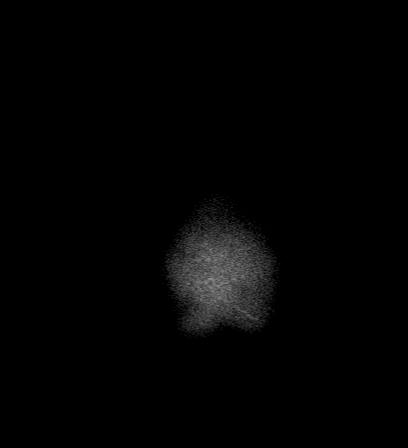

[Series 12: FLAIR · axial · 5.0mm · 0.38mm/px · z∈[-40,+103]mm · 2 of 23 slices shown]
[im 1/23]
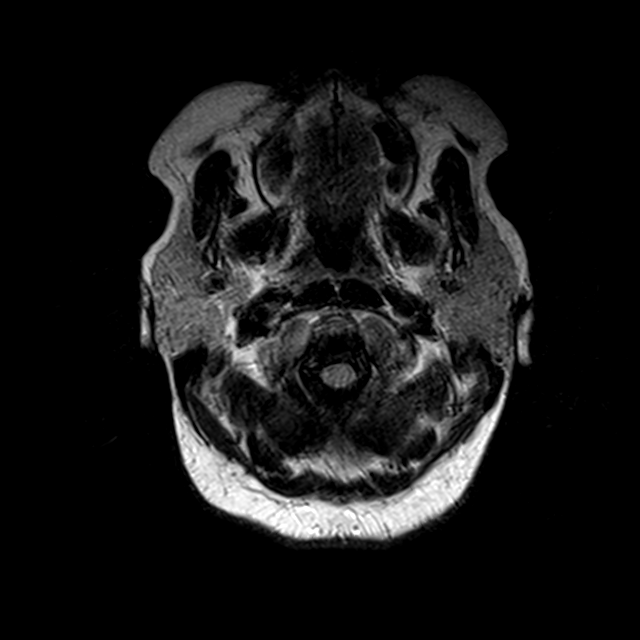
[im 23/23]
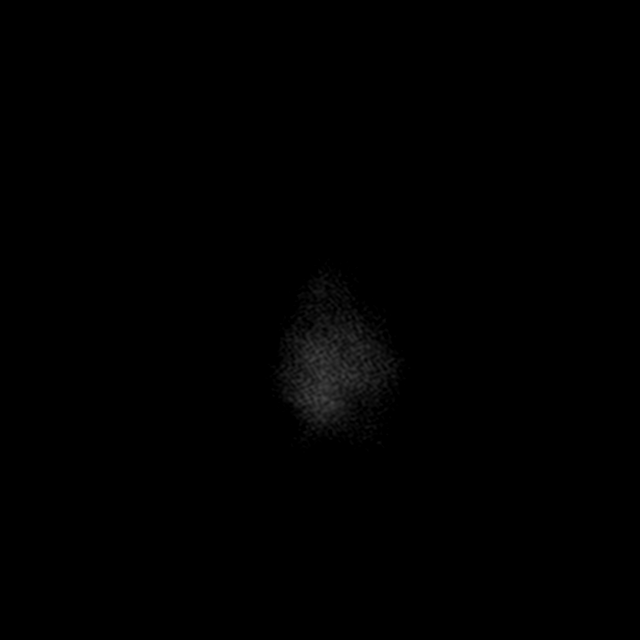

[Series 13: SWI · axial · 3.0mm · 0.45mm/px · z∈[-45,+108]mm · 5 of 52 slices shown]
[im 1/52]
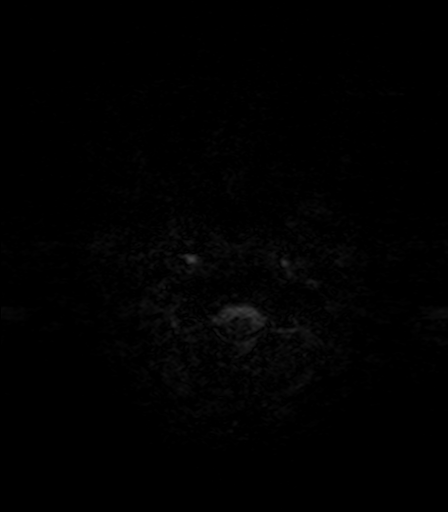
[im 13/52]
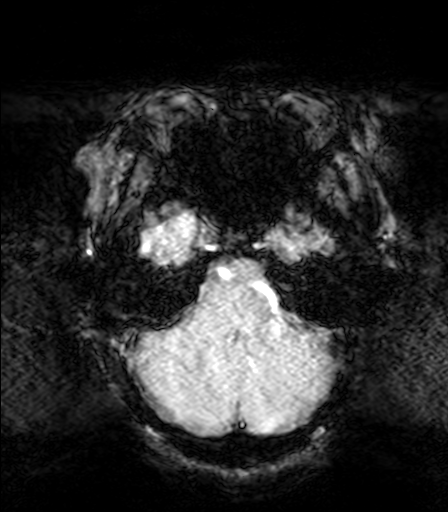
[im 26/52]
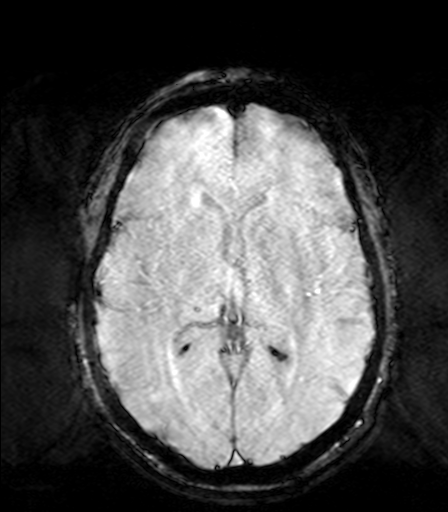
[im 39/52]
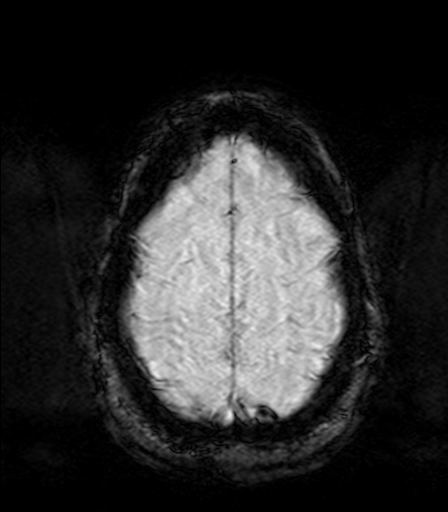
[im 52/52]
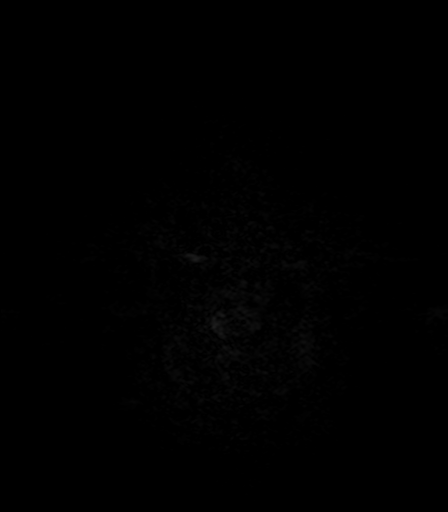

[Series 14: T1 · axial · 2.0mm · 0.45mm/px · z∈[-41,+79]mm · 6 of 73 slices shown (2 of 2)]
[im 1/73]
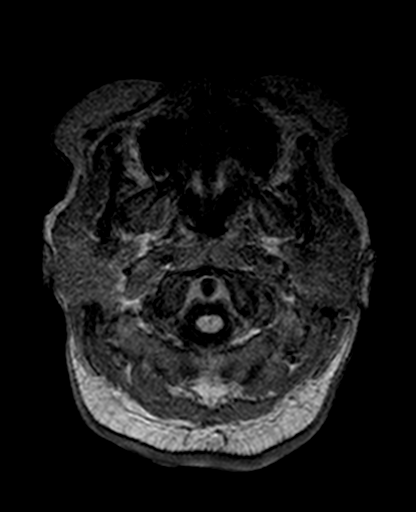
[im 13/73]
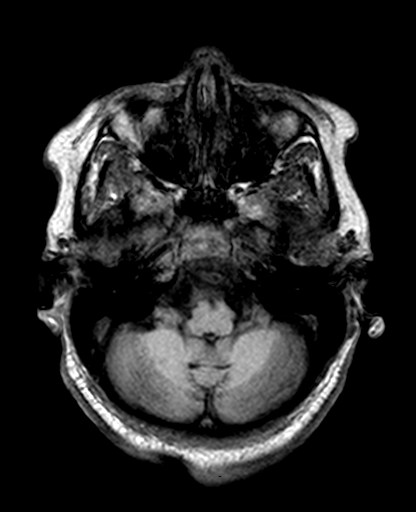
[im 25/73]
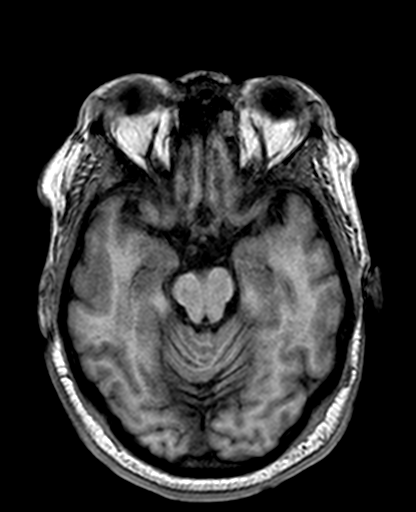
[im 37/73]
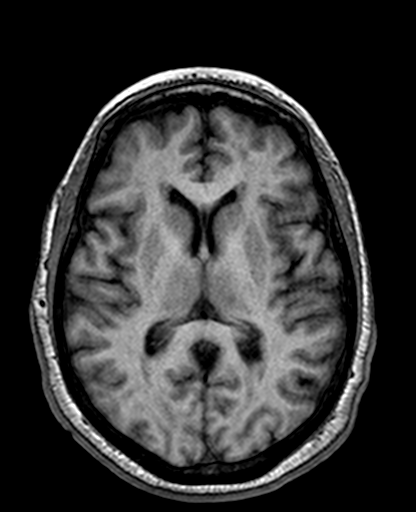
[im 49/73]
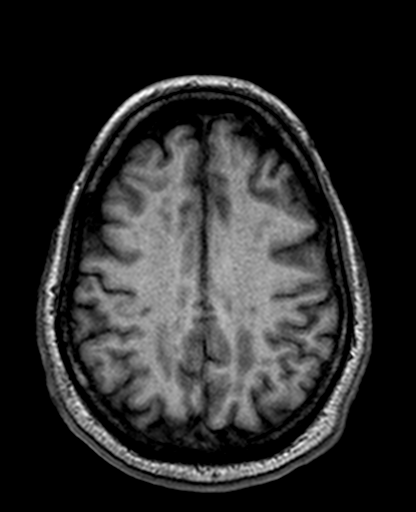
[im 61/73]
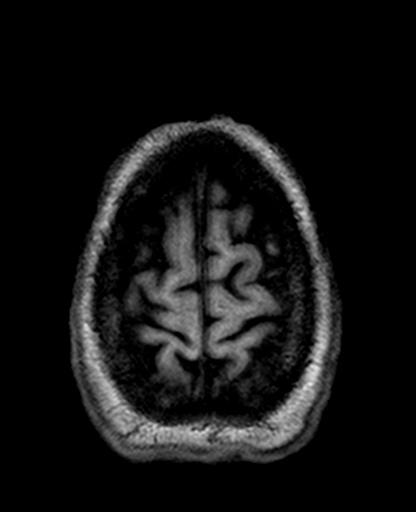

[Series 15: T2 · coronal · 5.0mm · 0.48mm/px · 2 of 24 slices shown (2 of 2)]
[im 1/24]
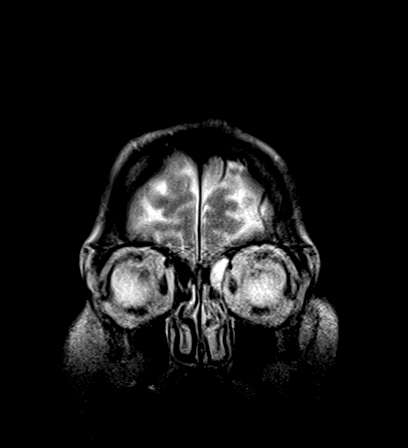
[im 24/24]
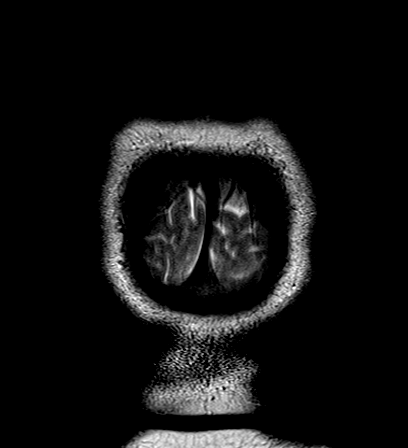

[19 of 48 positions shown; findings below may reference images not displayed]

FINDINGS: MRI HEAD FINDINGS

Small subacute nonhemorrhagic right cerebellar infarct.

No intracranial hemorrhage.

Prominent progressive white matter type changes. Although several of
the white matter changes involve the periventricular region, some of
which appear perpendicular as can be seen with plaques related to
multiple sclerosis (which cannot be excluded given the patient's age
and gender), the fact that the patient has multiple risk factors of
atherosclerosis in addition to remote bilateral thalamic infarcts
suggests that the white matter type changes may be related to result
of small vessel disease. Other causes of white matter type changes
such as that secondary to vasculitis or inflammatory process are
secondary less likely considerations.

No hydrocephalus.

No intracranial mass lesion noted on this unenhanced exam.

Exophthalmos.

Cervical medullary junction, pituitary region and pineal region
unremarkable.

Opacification left ethmoid sinus air cell minimal paranasal sinus
mucosal thickening otherwise noted.

MRA HEAD FINDINGS

Exam is motion degraded.

Markedly tortuous vertical cervical segment of the internal carotid
artery bilaterally.

Ectatic cavernous segment right internal carotid artery. Artifact
extends through the cavernous segment of the internal carotid
arteries bilaterally.

Moderate narrowing distal M1 segment/ bifurcation of the middle
cerebral artery bilaterally. Artifact may partially contribute to
this appearance.

Moderate to marked narrowing A1 segment anterior cerebral artery
more notable on the left.

A2 segment anterior cerebral artery and middle cerebral artery
distal branch vessel mild to moderate irregularity and narrowing
bilaterally.

Markedly ectatic vertebral arteries with fusiform dilation on the
left. Moderate to marked narrowing distal left vertebral artery.
Mild to moderate narrowing right vertebral artery.

Non visualization right posterior inferior cerebellar artery.
Narrowing left posterior inferior cerebellar artery.

Moderate narrowing proximal basilar artery. Basilar artery beyond
this region is ectatic.

Nonvisualization anterior inferior cerebellar artery bilaterally.

Narrowing of the superior cerebellar artery bilaterally.

Long segment moderate to marked stenosis of the P2 segment of the
right posterior cerebral artery. Mild to moderate narrowing P2
segment left posterior cerebral artery.

Mild to slightly moderate narrowing distal branches of the posterior
cerebral artery more notable on left.

No aneurysm or vascular malformation noted.
IMPRESSION: MR brain:

Small subacute nonhemorrhagic right cerebellar infarct.

Prominent progressive white matter type changes. Although several of
the white matter changes involve the periventricular region, some of
which appear perpendicular as can be seen with plaques related to
multiple sclerosis (which cannot be excluded given the patient's age
and gender), the fact that the patient has multiple risk factors of
atherosclerosis in addition to remote bilateral thalamic infarcts
suggests that the white matter type changes may be related to result
of small vessel disease.

MR angiogram:

Motion degraded with prominent intracranial atherosclerotic type
changes as detailed above.

## 2015-06-10 IMAGING — CT CT HEAD W/O CM
1 series · 15 of 30 positions shown, 19 images · non-contrast
Comparison: May 31, 2012

CLINICAL DATA: Right arm heaviness and weakness

EXAM:
CT HEAD WITHOUT CONTRAST
TECHNIQUE: Contiguous axial images were obtained from the base of the skull
through the vertex without intravenous contrast. Study was obtained
within 24 hr of patient's arrival the emergency department.

[Series 2: headseq 4.8 h37s · axial · 0.43mm/px · z∈[+1186,+1319]mm · 15 of 30 slices shown, 19 images]
[im 2/30  brain]
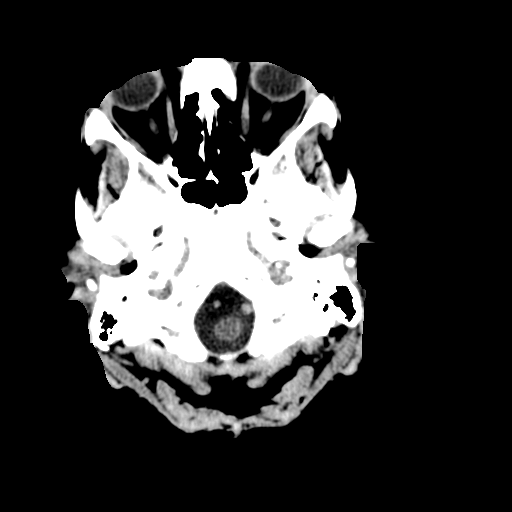
[im 2/30  bone]
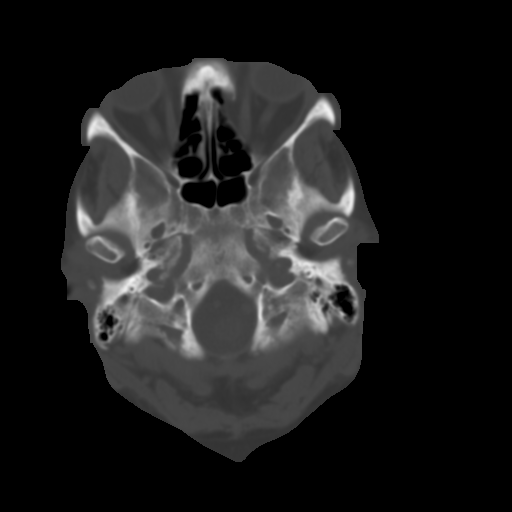
[im 4/30  brain]
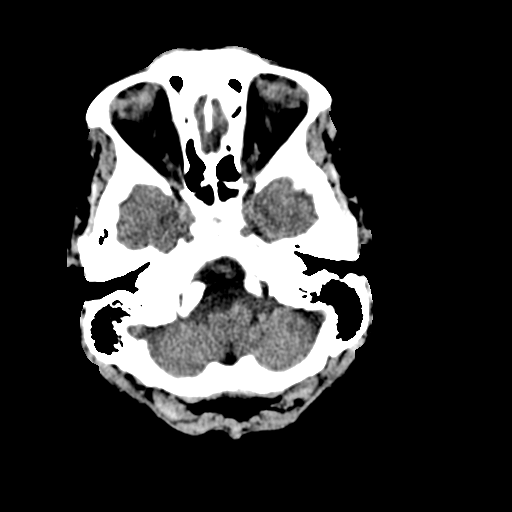
[im 6/30  brain]
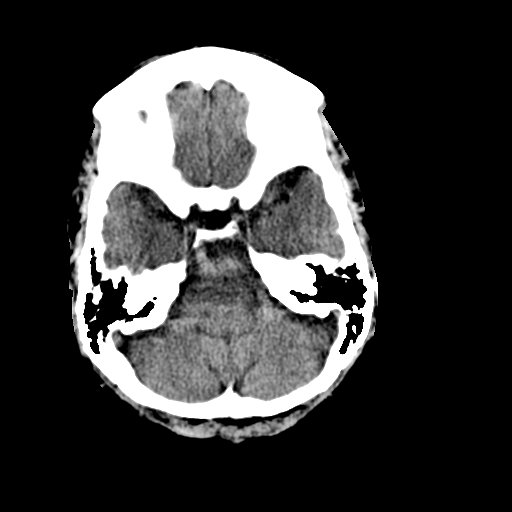
[im 8/30  brain]
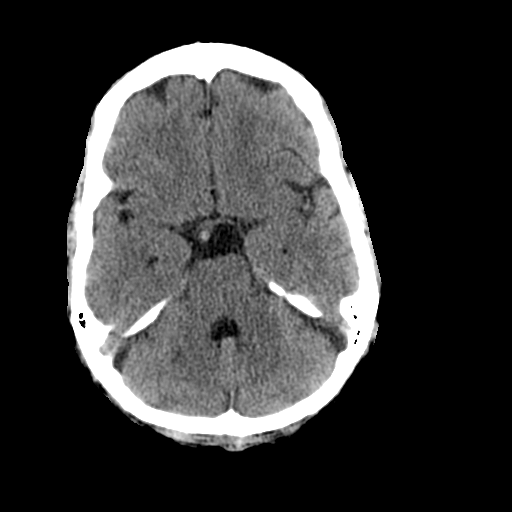
[im 10/30  brain]
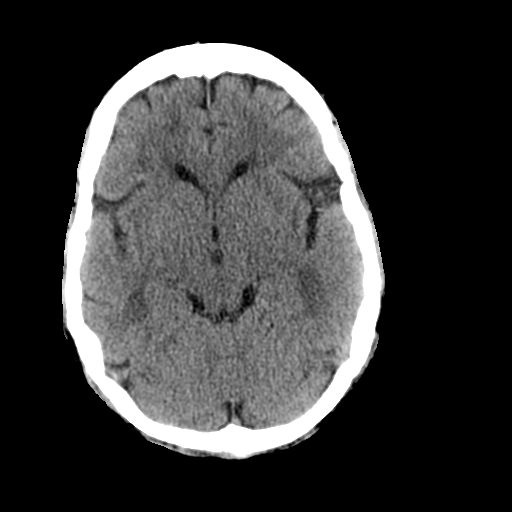
[im 10/30  bone]
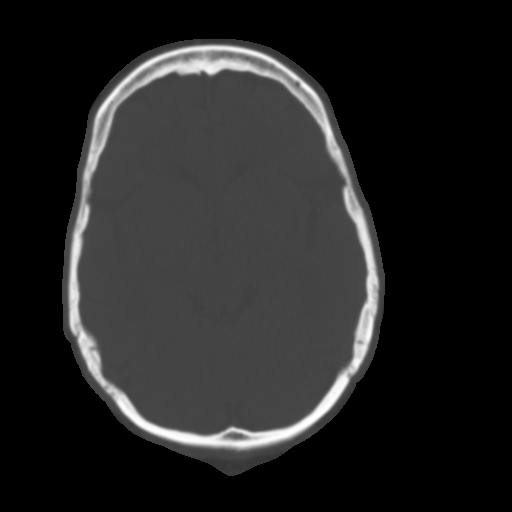
[im 12/30  brain]
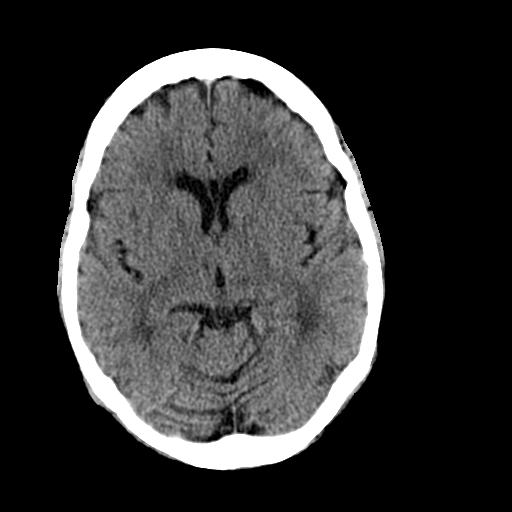
[im 14/30  brain]
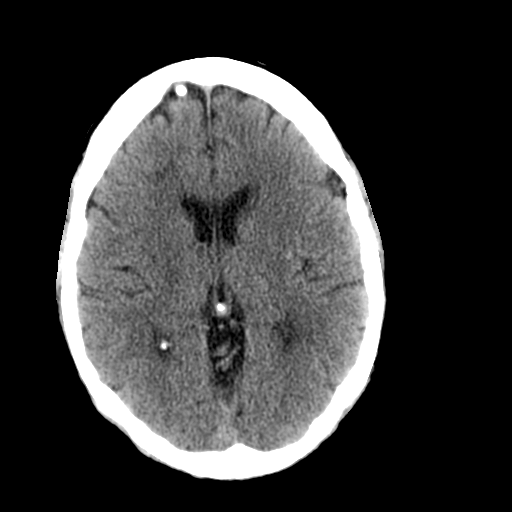
[im 16/30  brain]
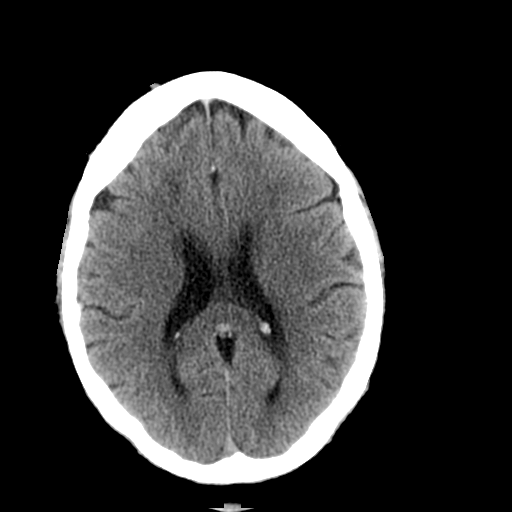
[im 17/30  brain]
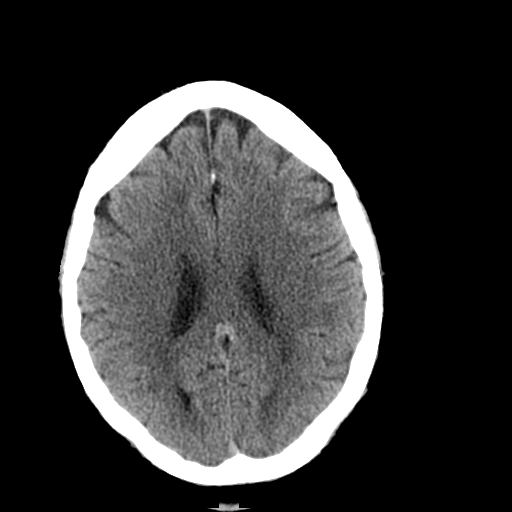
[im 17/30  bone]
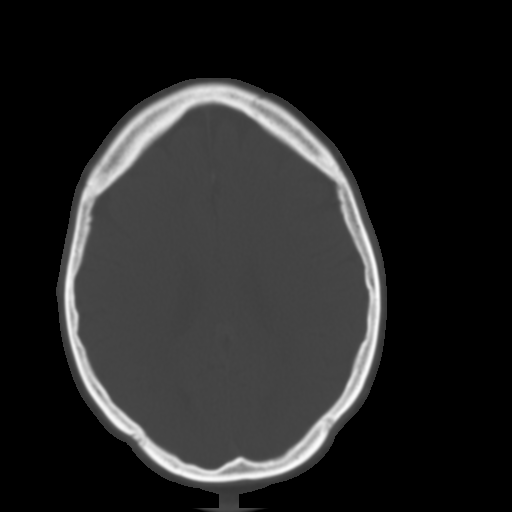
[im 19/30  brain]
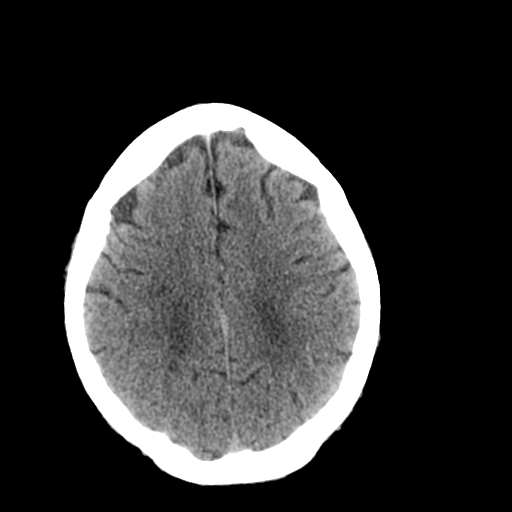
[im 21/30  brain]
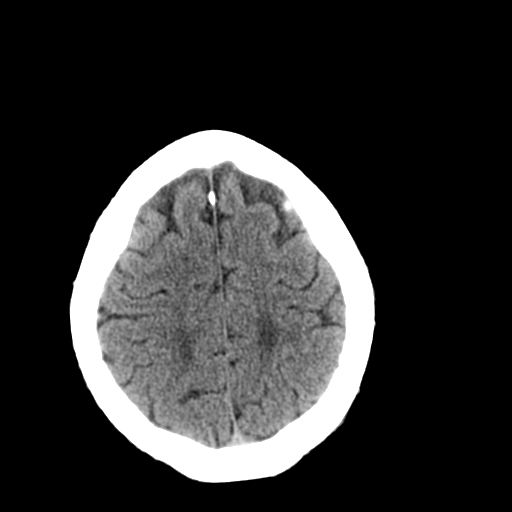
[im 23/30  brain]
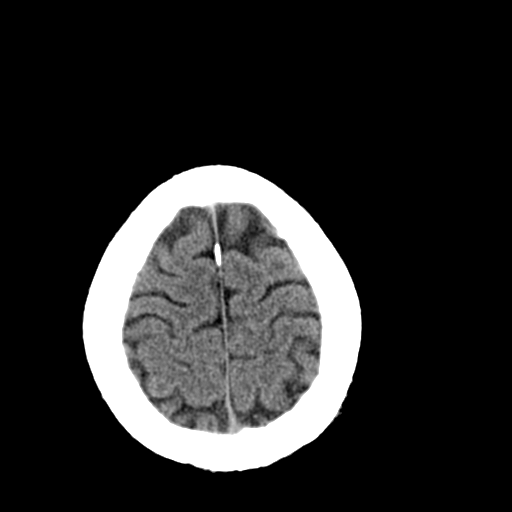
[im 25/30  brain]
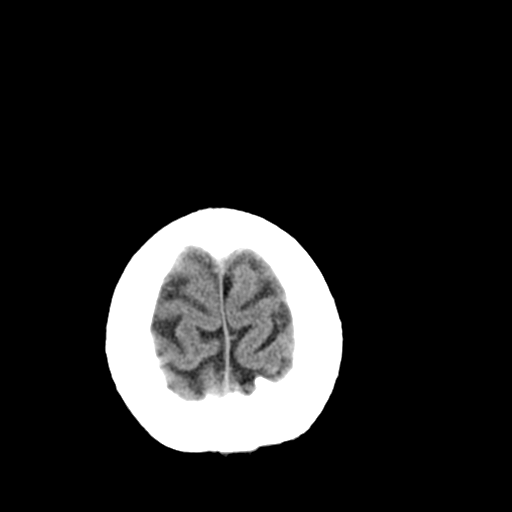
[im 25/30  bone]
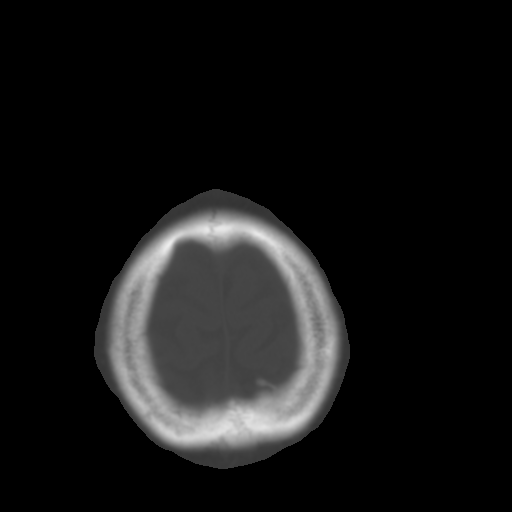
[im 27/30  brain]
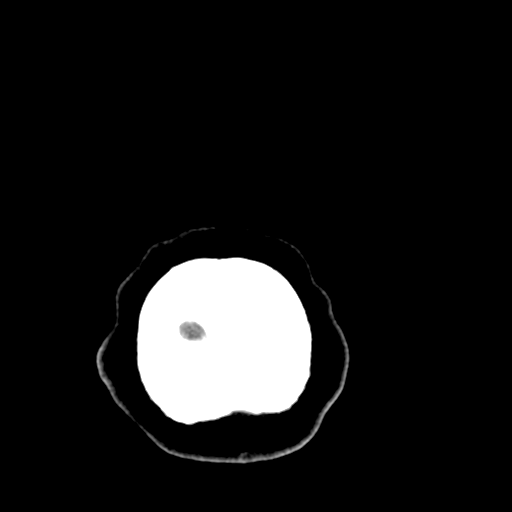
[im 29/30  brain]
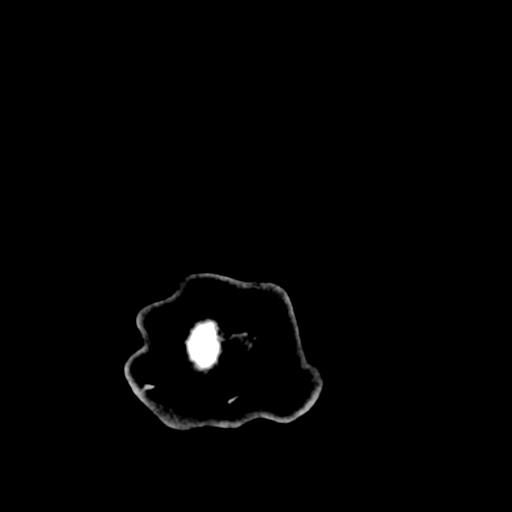

[15 of 30 positions shown; findings below may reference images not displayed]

FINDINGS: The ventricles are upper normal in size with normal contour. A small
cavum septum pellucidum and a small cavum vergae are anatomic
variants. There is no demonstrable mass, hemorrhage, extra-axial
fluid collection, or midline shift.

There is an acute small infarct in the mid left thalamus. No other
acute infarct is identified. Patchy small vessel disease in the
centra semiovale are stable.

The vertebrobasilar system is again noted to be somewhat
dolichoectatic for age.
IMPRESSION: Small acute infarct in the left thalamus. No other acute infarct
apparent. Stable periventricular small vessel disease. No hemorrhage
or mass effect.

:
Bony calvarium appears intact. The mastoid air cells are clear. A
defect in the medial left orbital wall is probably due to old
trauma. Fat from the medial left orbit extends into the area of
depression, a stable finding.

## 2015-06-10 IMAGING — CR DG CHEST 2V
2 series · 2 of 2 positions shown · non-contrast
Comparison: 11/11/2012

CLINICAL DATA: Weakness.  Smoker.

EXAM:
CHEST  2 VIEW

[view not recorded (1 of 2)]
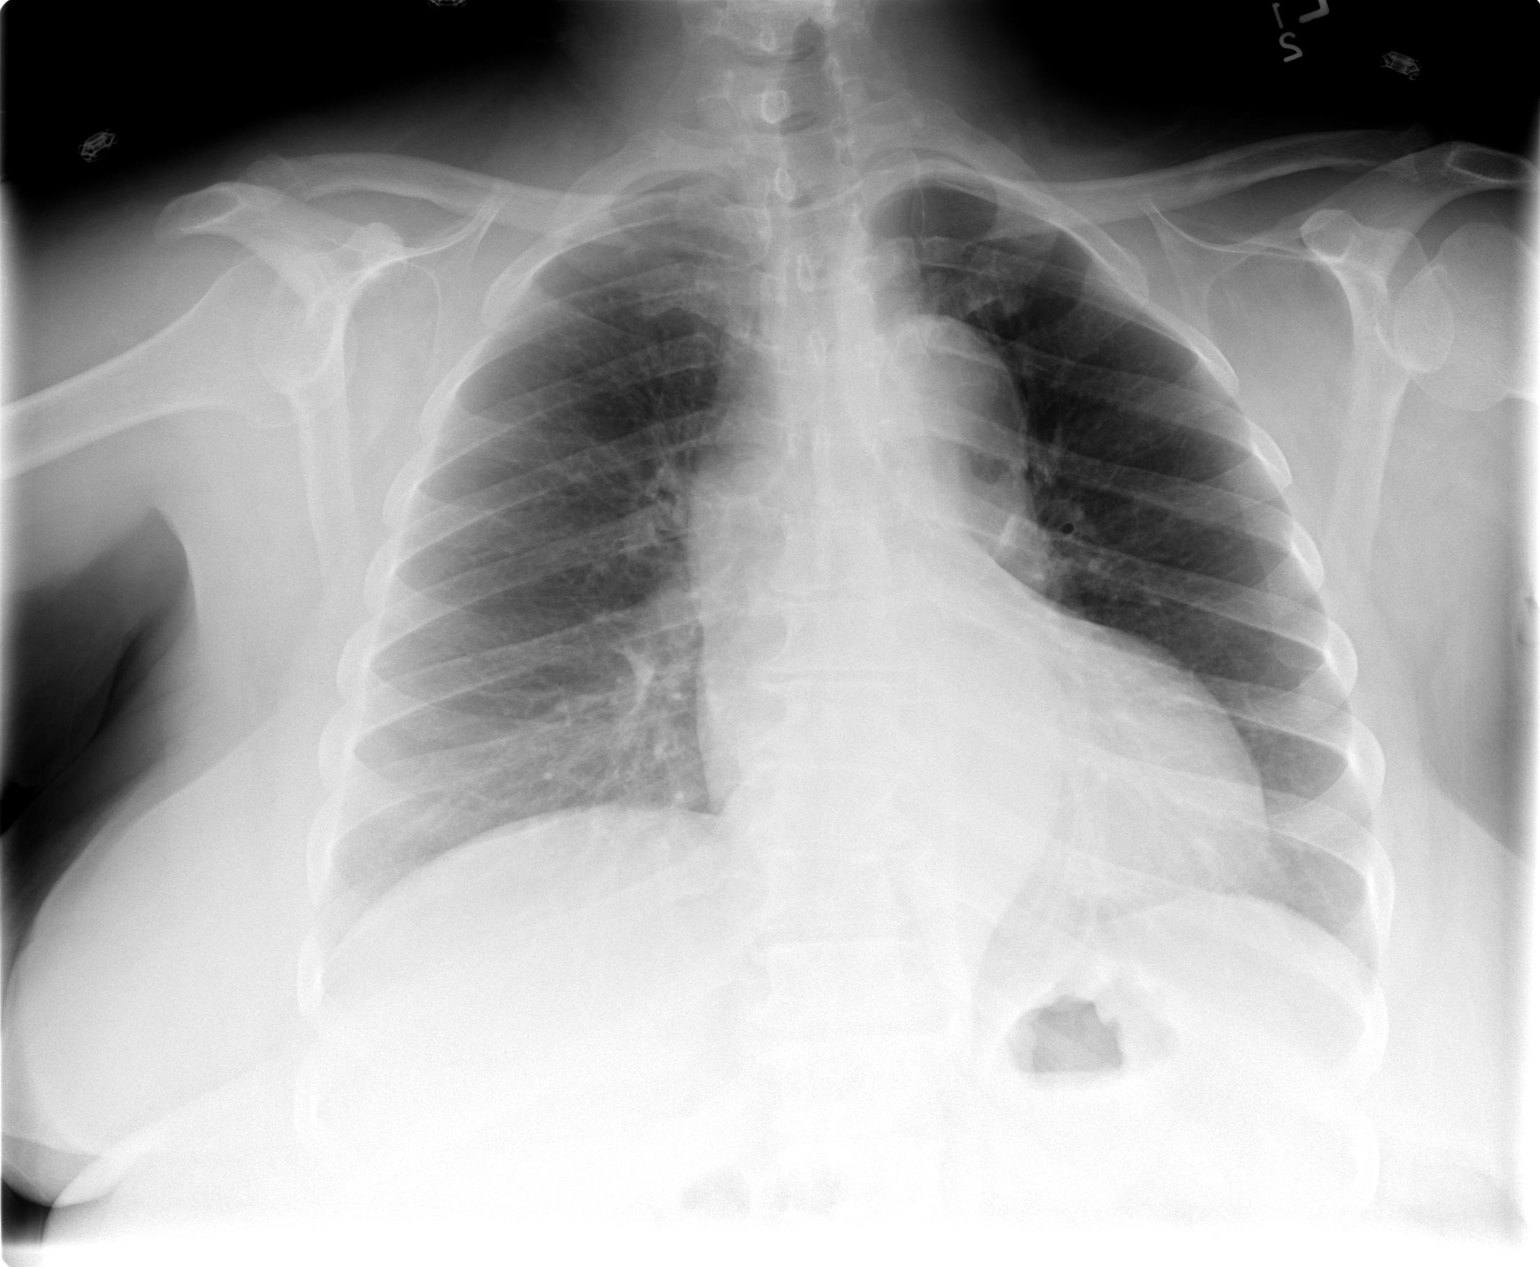

[view not recorded (2 of 2)]
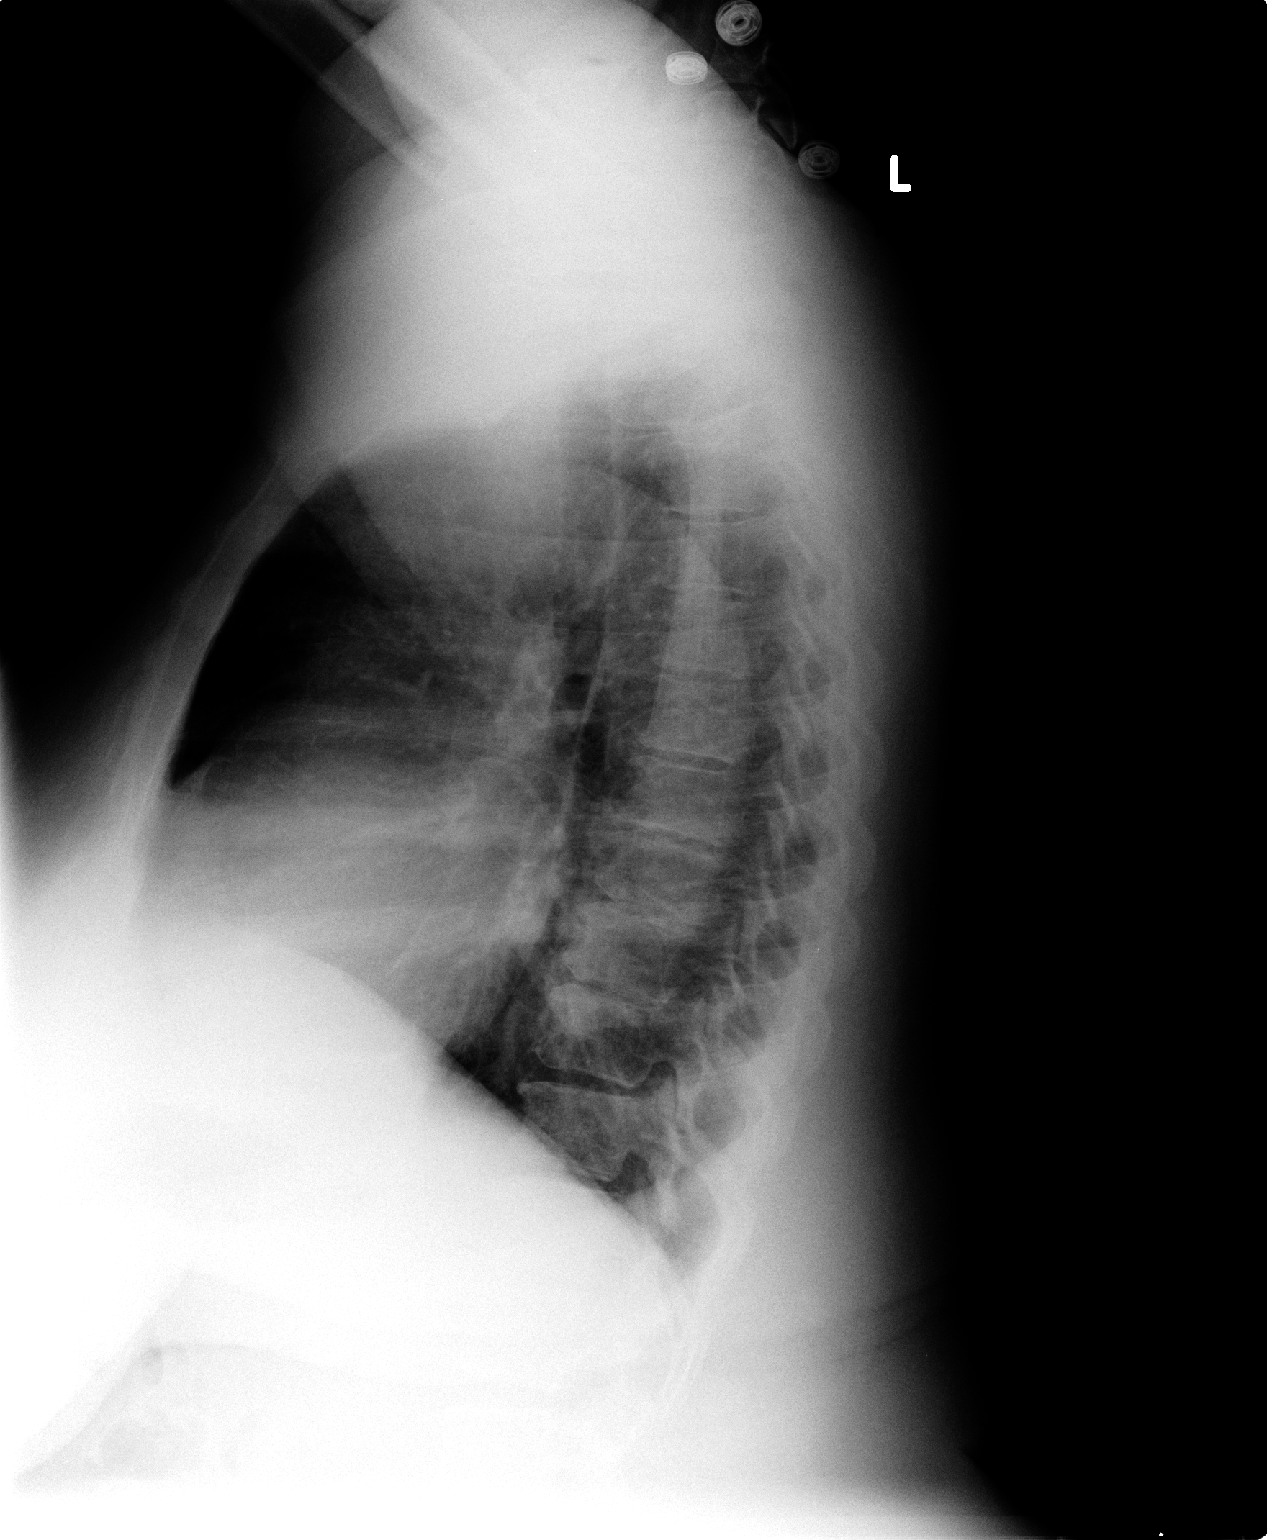

[2 of 2 positions shown; findings below may reference images not displayed]

FINDINGS: The heart size and mediastinal contours are within normal limits.
Both lungs are clear. The visualized skeletal structures are
unremarkable.
IMPRESSION: No active cardiopulmonary disease.

## 2015-06-11 IMAGING — US US CAROTID DUPLEX BILAT
1 series · 13 of 24 positions shown · non-contrast
Comparison: None.

CLINICAL DATA: Stroke

EXAM:
BILATERAL CAROTID DUPLEX ULTRASOUND
TECHNIQUE: Gray scale imaging, color Doppler and duplex ultrasound were
performed of bilateral carotid and vertebral arteries in the neck.

[Series 1: us carotid duplex bilat · 0.06mm/px · 13 of 73 slices shown]
[im 1/73]
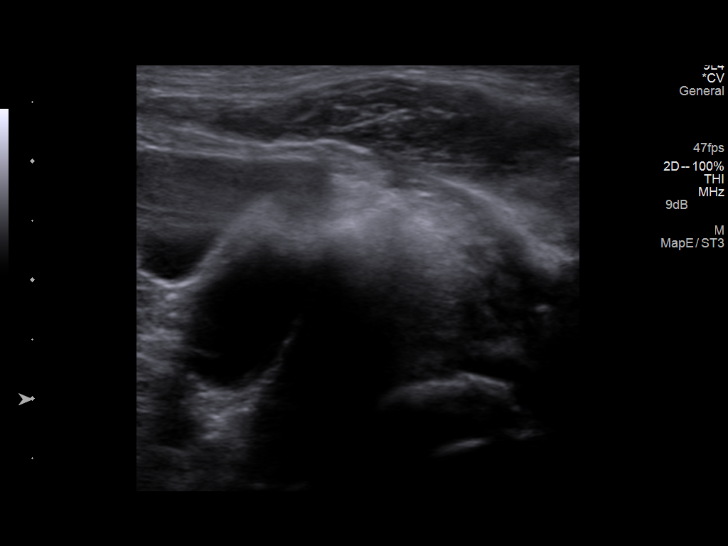
[im 7/73]
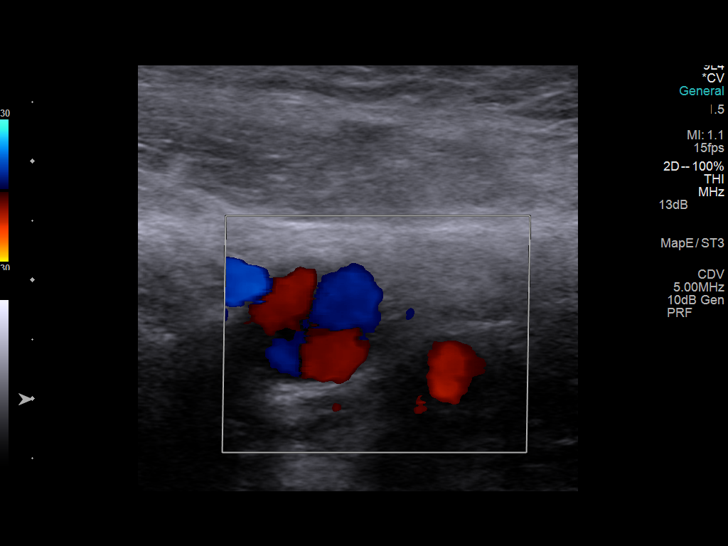
[im 13/73]
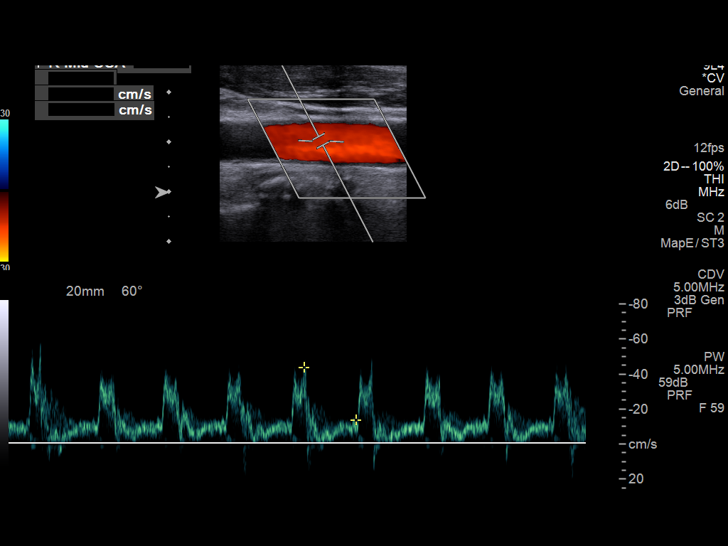
[im 19/73]
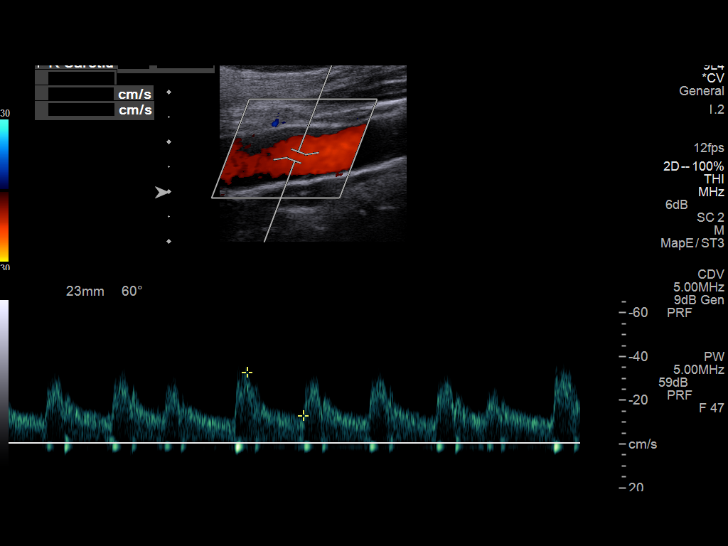
[im 26/73]
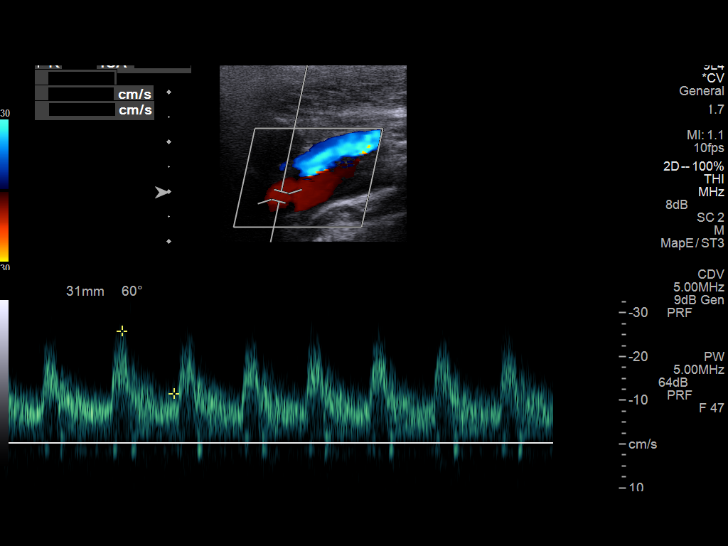
[im 32/73]
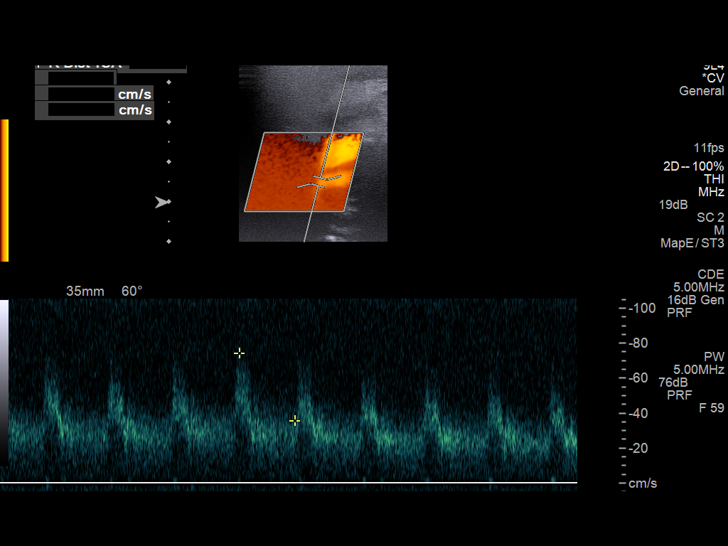
[im 38/73]
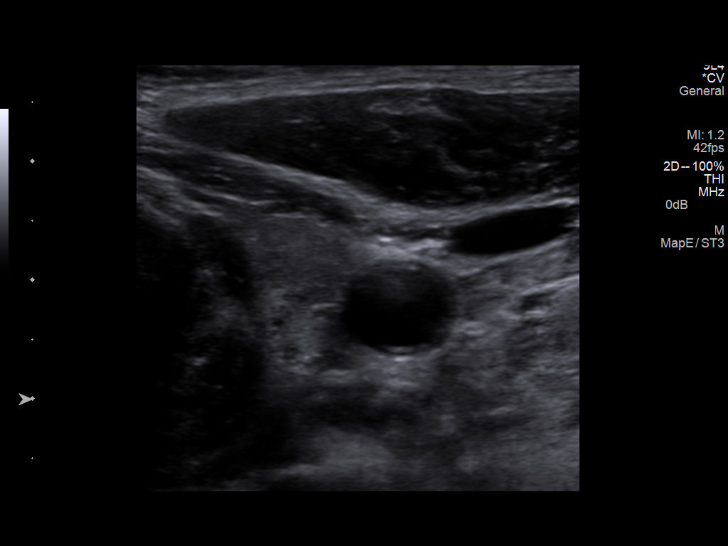
[im 41/73]
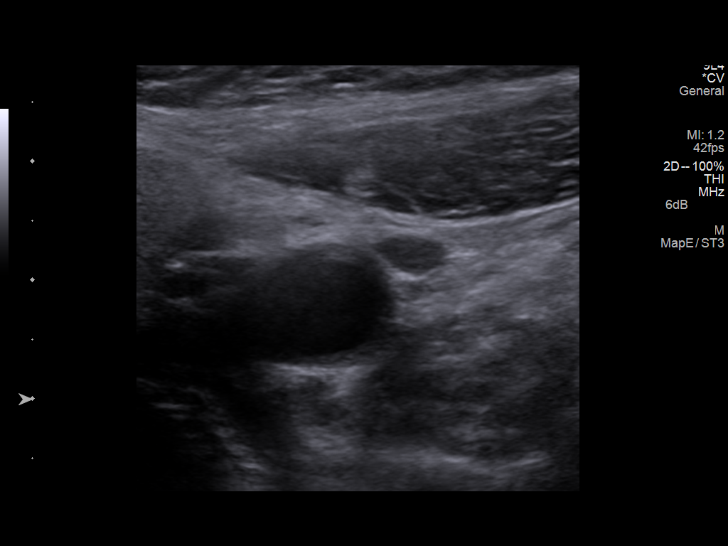
[im 47/73]
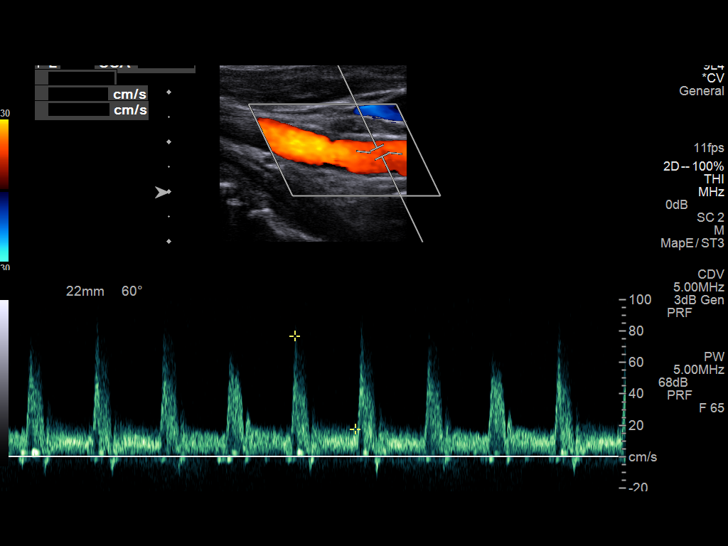
[im 54/73]
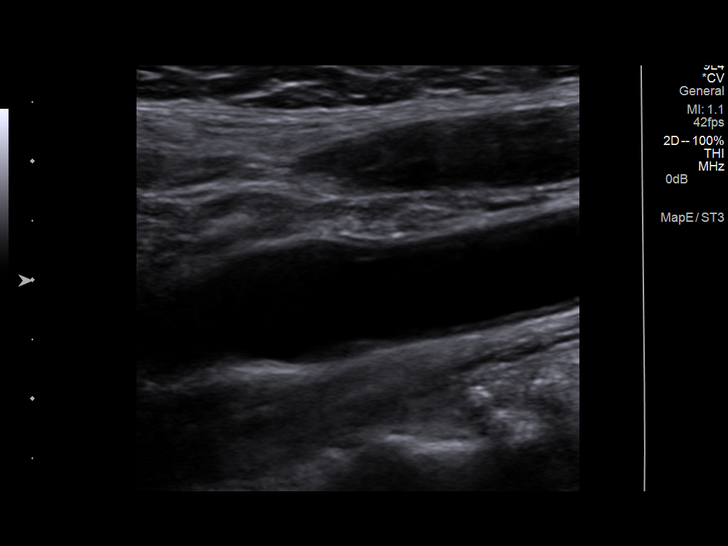
[im 60/73]
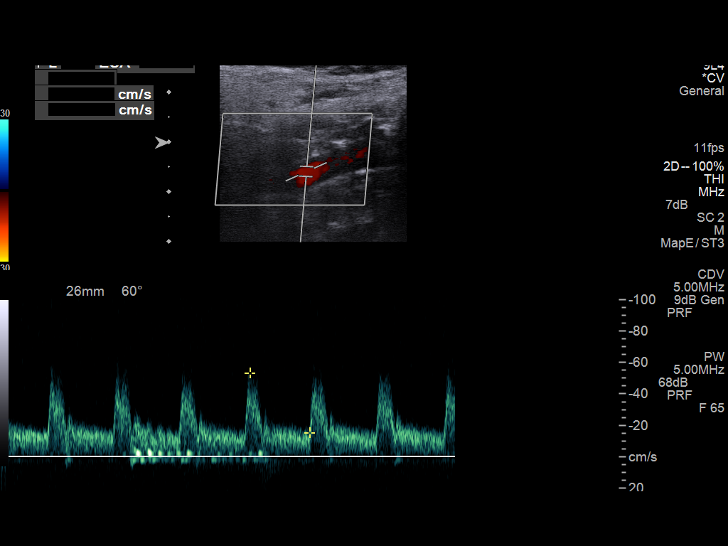
[im 66/73]
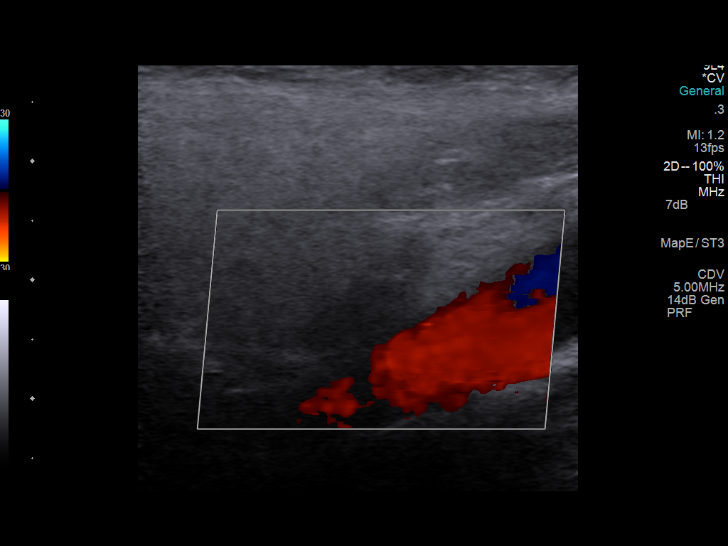
[im 73/73]
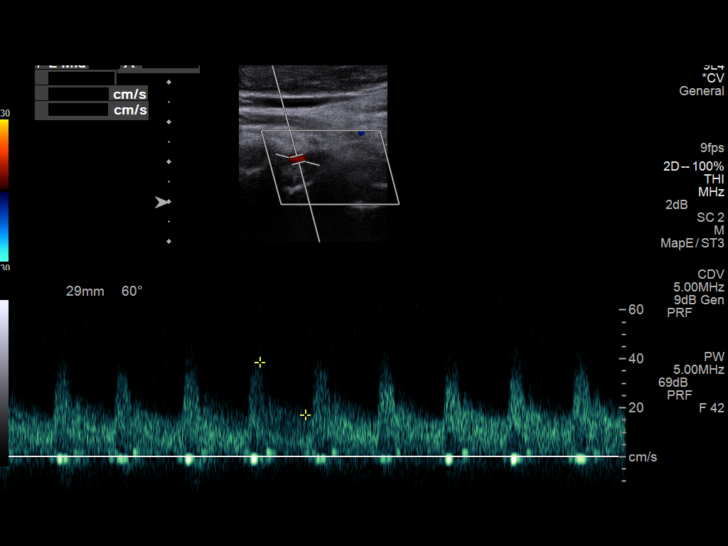

[13 of 24 positions shown; findings below may reference images not displayed]

FINDINGS: Criteria: Quantification of carotid stenosis is based on velocity
parameters that correlate the residual internal carotid diameter
with NASCET-based stenosis levels, using the diameter of the distal
internal carotid lumen as the denominator for stenosis measurement.

The following velocity measurements were obtained:

RIGHT

ICA:  74 cm/sec

CCA:  44 cm/sec

SYSTOLIC ICA/CCA RATIO:

DIASTOLIC ICA/CCA RATIO:

ECA:  74 cm/sec

LEFT

ICA:  30 cm/sec

CCA:  53 cm/sec

SYSTOLIC ICA/CCA RATIO:

DIASTOLIC ICA/CCA RATIO:

ECA:  53 cm/sec

RIGHT CAROTID ARTERY: Little if any plaque in the bulb. Low
resistance internal carotid Doppler pattern.

RIGHT VERTEBRAL ARTERY:  Antegrade.  Normal Doppler pattern.

LEFT CAROTID ARTERY: Little if any plaque in the bulb. Low
resistance internal carotid Doppler pattern.

LEFT VERTEBRAL ARTERY:  Antegrade.  Normal Doppler pattern.
IMPRESSION: Less than 50% stenosis in the right and left internal carotid
arteries.

## 2015-06-12 ENCOUNTER — Encounter: Payer: Medicaid Other | Admitting: Family Medicine

## 2015-06-12 ENCOUNTER — Encounter: Payer: Self-pay | Admitting: *Deleted

## 2015-06-13 ENCOUNTER — Other Ambulatory Visit: Payer: Self-pay | Admitting: Family Medicine

## 2015-06-13 DIAGNOSIS — I509 Heart failure, unspecified: Secondary | ICD-10-CM | POA: Diagnosis not present

## 2015-07-02 ENCOUNTER — Other Ambulatory Visit: Payer: Self-pay | Admitting: Endocrinology

## 2015-07-03 ENCOUNTER — Other Ambulatory Visit: Payer: Self-pay | Admitting: Family Medicine

## 2015-07-14 DIAGNOSIS — I509 Heart failure, unspecified: Secondary | ICD-10-CM | POA: Diagnosis not present

## 2015-07-17 ENCOUNTER — Other Ambulatory Visit: Payer: Self-pay | Admitting: Family Medicine

## 2015-07-28 IMAGING — CR DG THORACIC SPINE 3V
3 series · 3 of 3 positions shown · non-contrast
Comparison: None.

CLINICAL DATA: Right back pain

EXAM:
THORACIC SPINE - 2 VIEW + SWIMMERS

[view not recorded (1 of 3)]
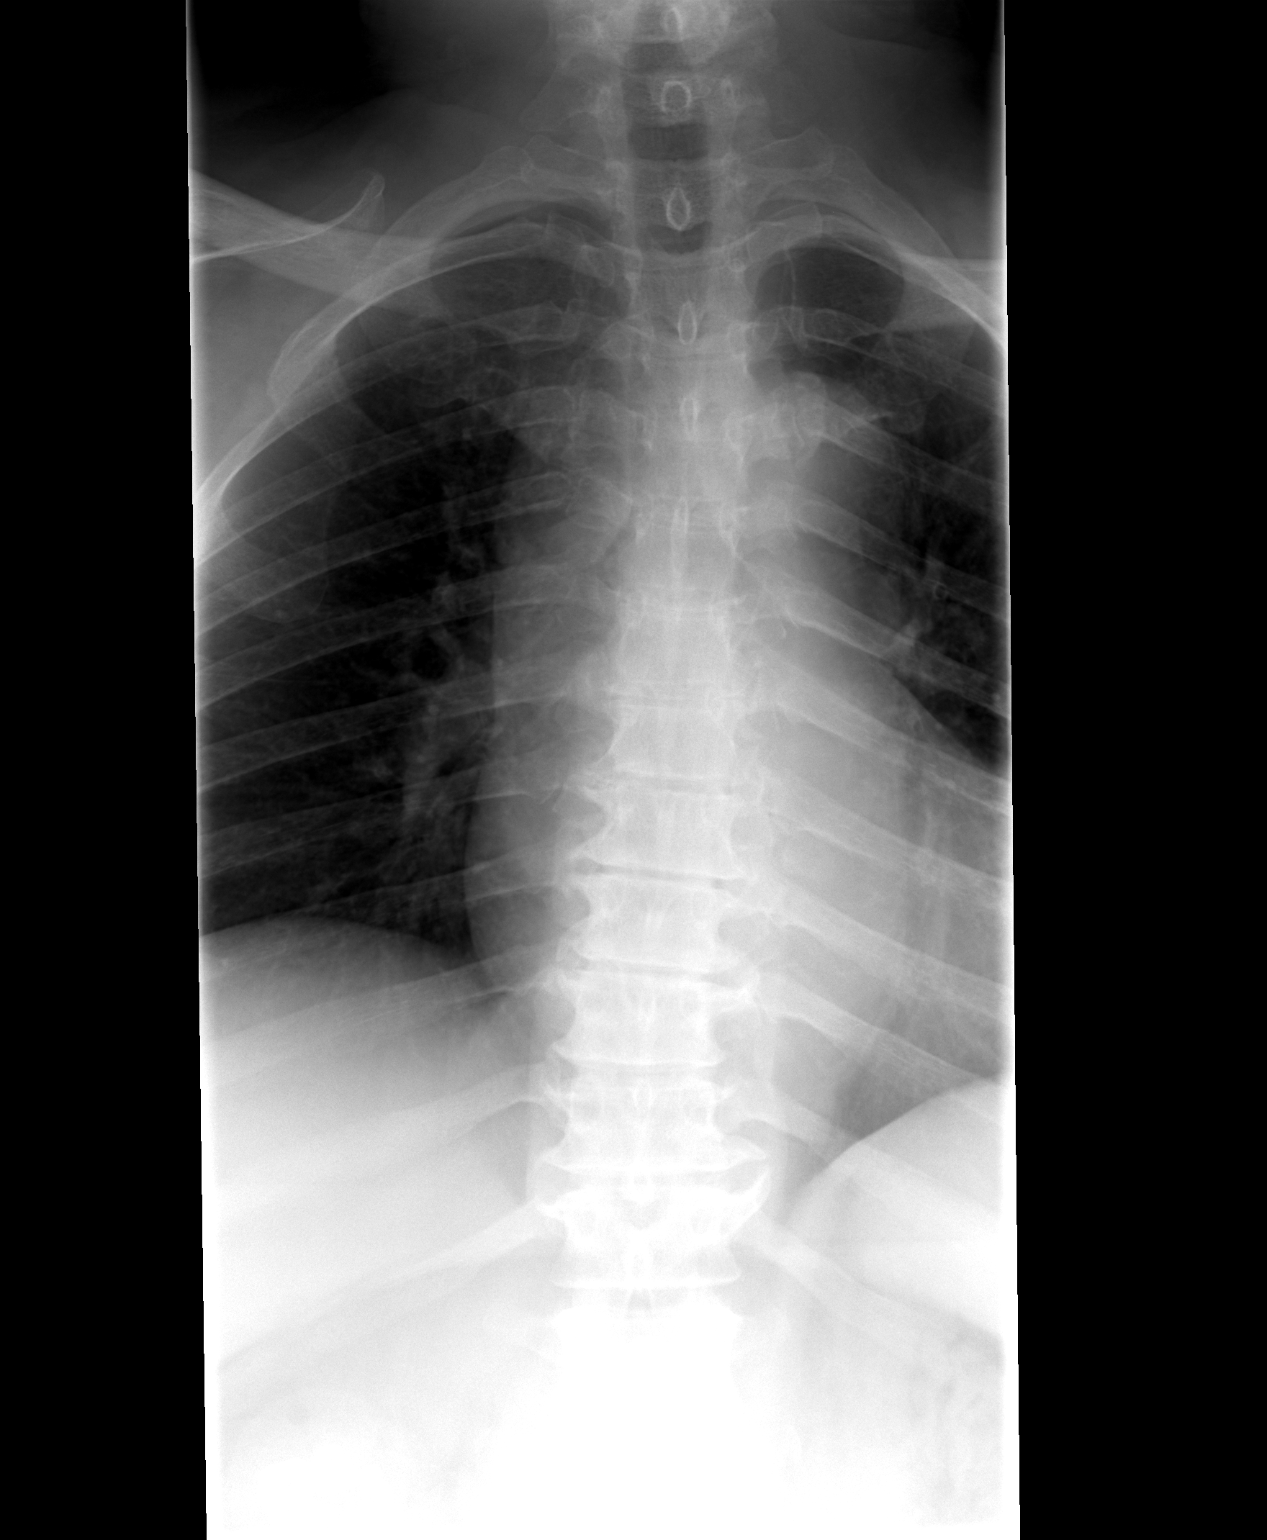

[view not recorded (2 of 3)]
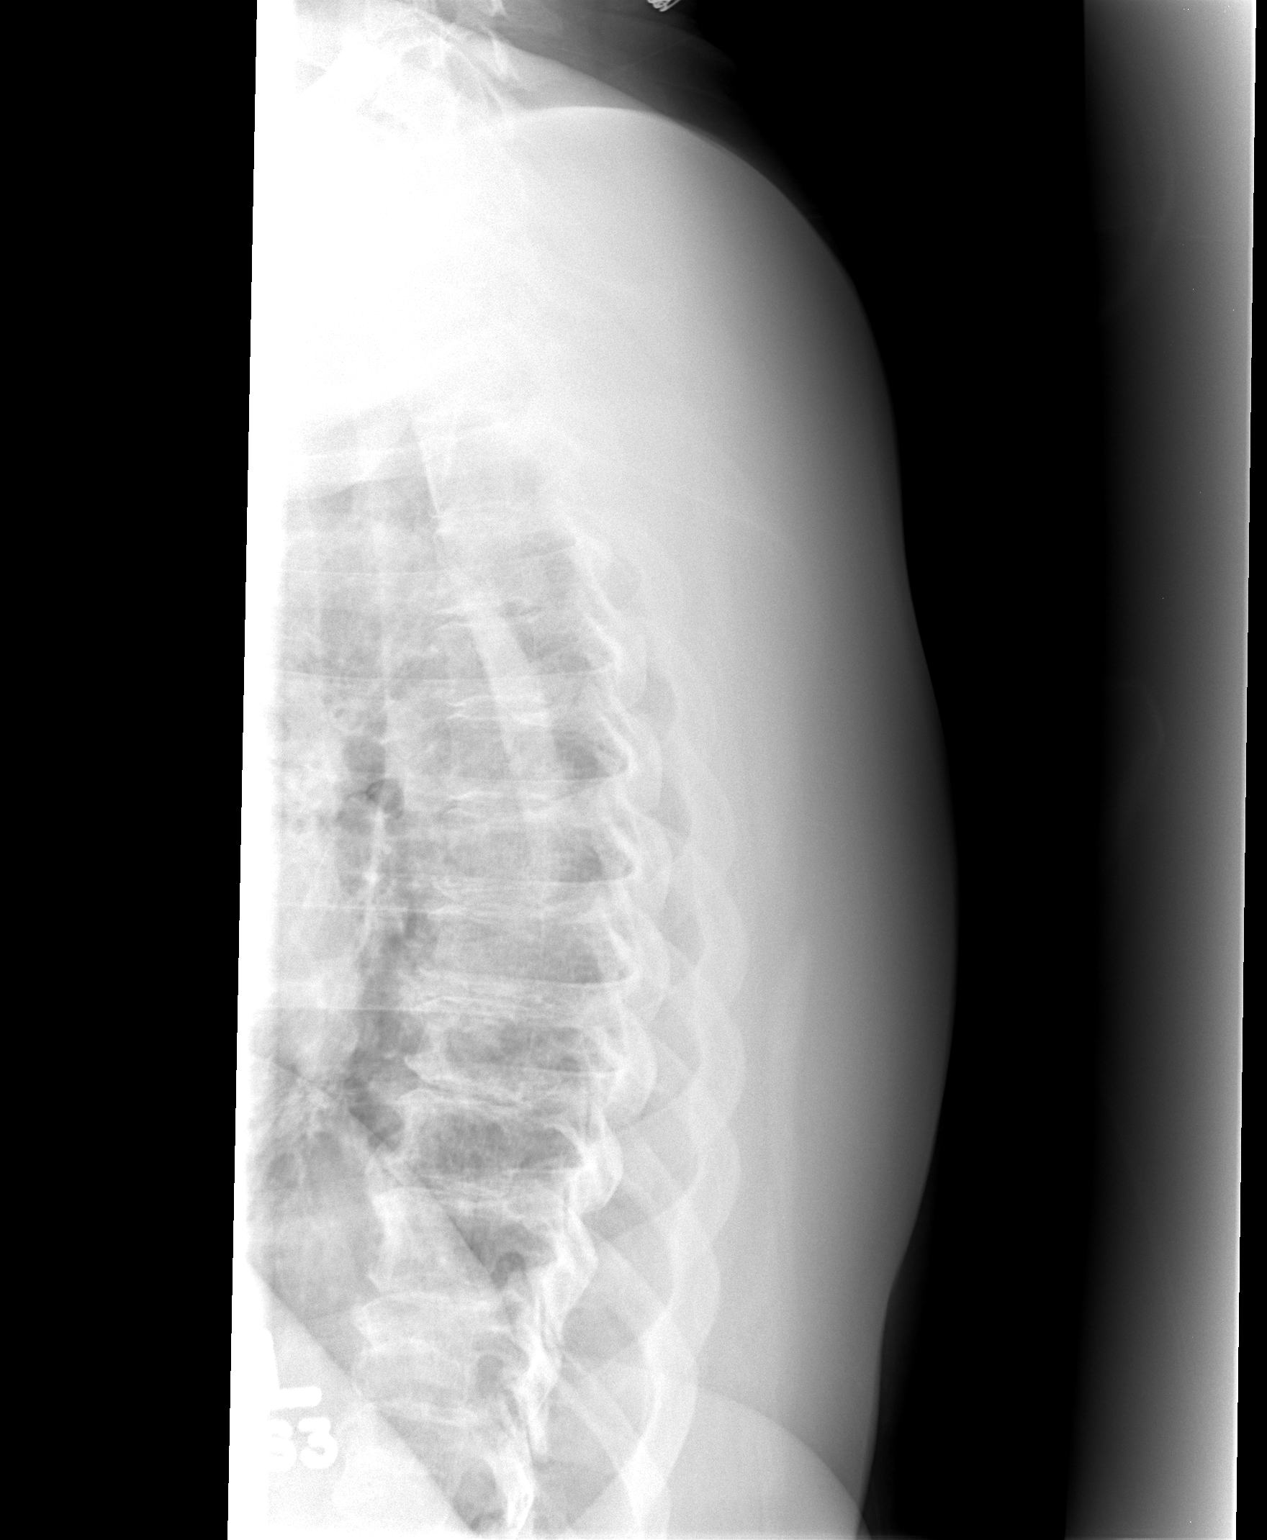

[view not recorded (3 of 3)]
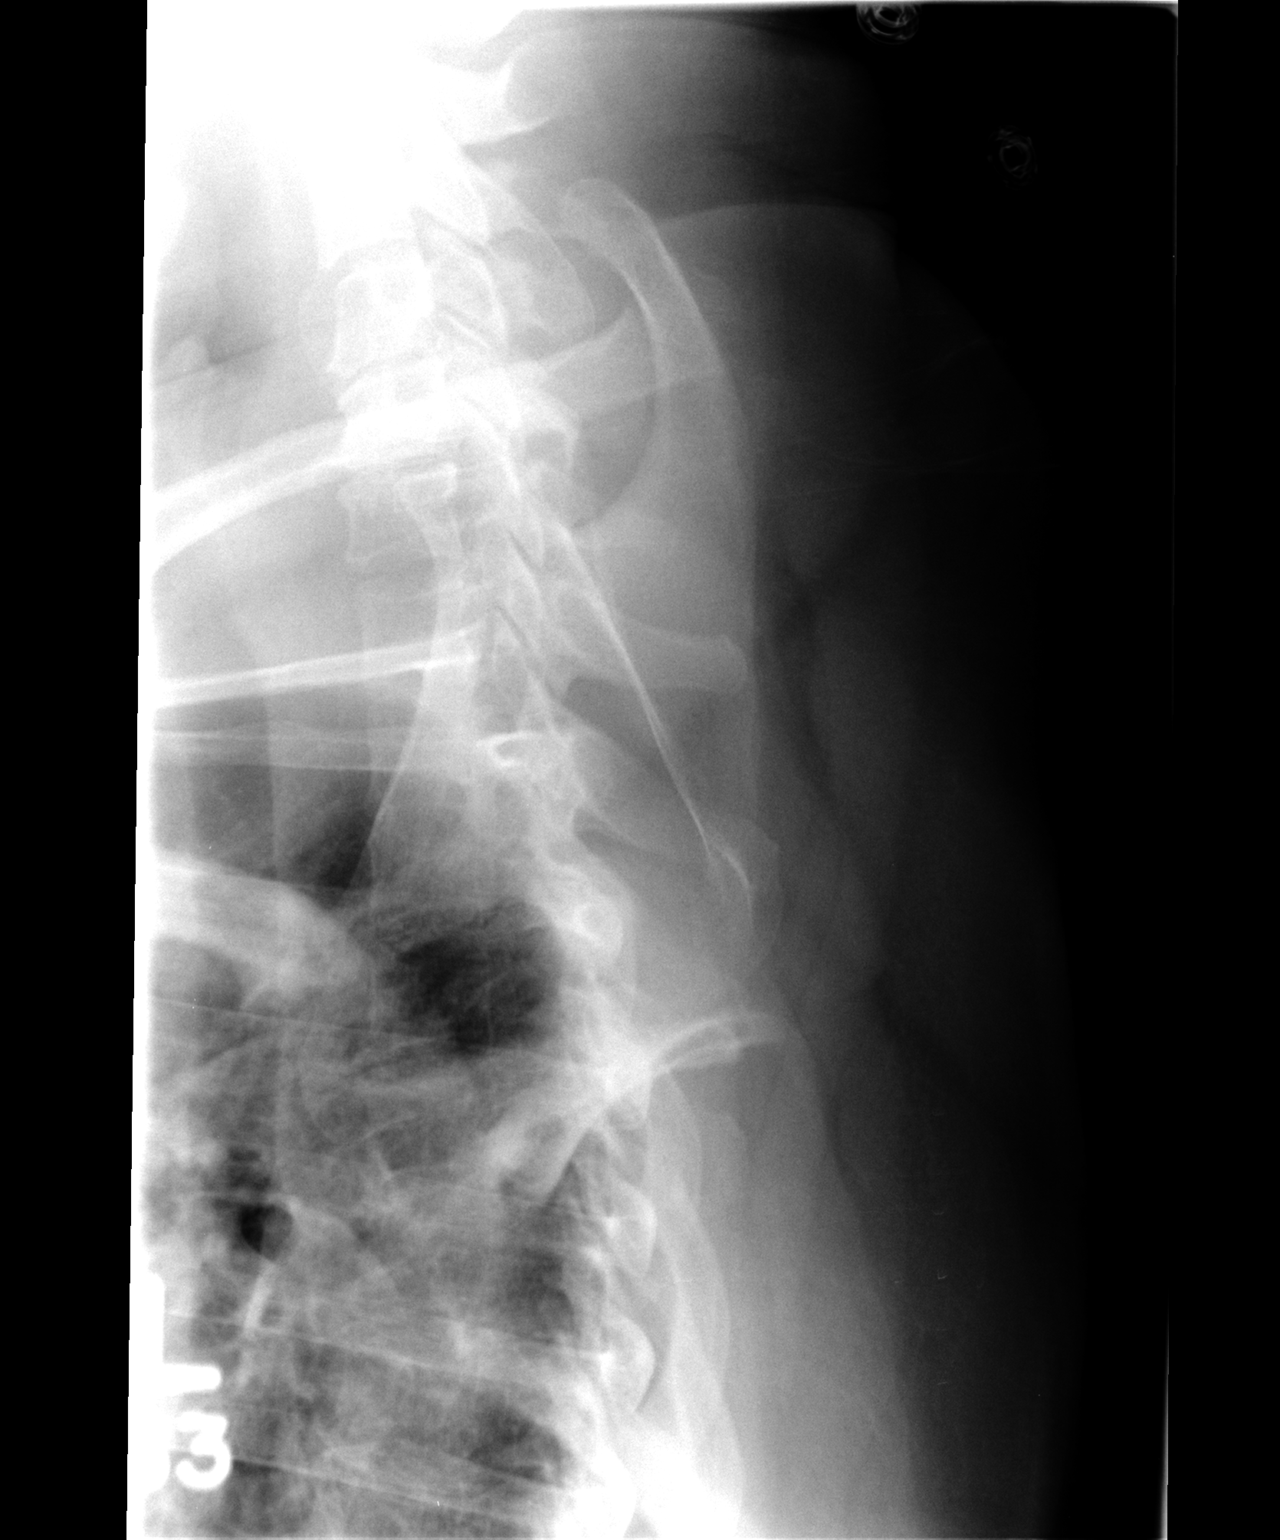

[3 of 3 positions shown; findings below may reference images not displayed]

FINDINGS: There is no evidence of thoracic spine fracture. Alignment is
normal. There is mild thoracic spine spondylosis. No other
significant bone abnormalities are identified.
IMPRESSION: No acute osseous injury of the thoracic spine.
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
## 2015-08-13 ENCOUNTER — Other Ambulatory Visit: Payer: Self-pay | Admitting: Endocrinology

## 2015-08-13 DIAGNOSIS — I509 Heart failure, unspecified: Secondary | ICD-10-CM | POA: Diagnosis not present

## 2015-08-19 ENCOUNTER — Other Ambulatory Visit: Payer: Self-pay | Admitting: Family Medicine

## 2015-08-26 ENCOUNTER — Other Ambulatory Visit: Payer: Self-pay | Admitting: Family Medicine

## 2015-08-26 DIAGNOSIS — Z1231 Encounter for screening mammogram for malignant neoplasm of breast: Secondary | ICD-10-CM

## 2015-09-03 ENCOUNTER — Encounter: Payer: Self-pay | Admitting: Family Medicine

## 2015-09-03 ENCOUNTER — Ambulatory Visit (INDEPENDENT_AMBULATORY_CARE_PROVIDER_SITE_OTHER): Payer: Medicare Other | Admitting: Family Medicine

## 2015-09-03 VITALS — BP 180/130 | HR 106 | Resp 16 | Ht 61.0 in | Wt 189.0 lb

## 2015-09-03 DIAGNOSIS — Z9119 Patient's noncompliance with other medical treatment and regimen: Secondary | ICD-10-CM

## 2015-09-03 DIAGNOSIS — Z91199 Patient's noncompliance with other medical treatment and regimen due to unspecified reason: Secondary | ICD-10-CM

## 2015-09-03 DIAGNOSIS — F17219 Nicotine dependence, cigarettes, with unspecified nicotine-induced disorders: Secondary | ICD-10-CM

## 2015-09-03 DIAGNOSIS — E669 Obesity, unspecified: Secondary | ICD-10-CM | POA: Diagnosis not present

## 2015-09-03 DIAGNOSIS — F1910 Other psychoactive substance abuse, uncomplicated: Secondary | ICD-10-CM

## 2015-09-03 DIAGNOSIS — Z0000 Encounter for general adult medical examination without abnormal findings: Secondary | ICD-10-CM

## 2015-09-03 DIAGNOSIS — N912 Amenorrhea, unspecified: Secondary | ICD-10-CM

## 2015-09-03 DIAGNOSIS — E1169 Type 2 diabetes mellitus with other specified complication: Secondary | ICD-10-CM

## 2015-09-03 DIAGNOSIS — E119 Type 2 diabetes mellitus without complications: Secondary | ICD-10-CM

## 2015-09-03 DIAGNOSIS — Z23 Encounter for immunization: Secondary | ICD-10-CM

## 2015-09-03 DIAGNOSIS — J441 Chronic obstructive pulmonary disease with (acute) exacerbation: Secondary | ICD-10-CM

## 2015-09-03 DIAGNOSIS — I10 Essential (primary) hypertension: Secondary | ICD-10-CM

## 2015-09-03 MED ORDER — PROMETHAZINE-DM 6.25-15 MG/5ML PO SYRP: 5.0000 mL | ORAL_SOLUTION | Freq: Every evening | ORAL | Status: DC | PRN

## 2015-09-03 MED ORDER — AZELASTINE HCL 0.1 % NA SOLN: 2.0000 | Freq: Two times a day (BID) | NASAL | Status: DC

## 2015-09-03 MED ORDER — MONTELUKAST SODIUM 10 MG PO TABS: ORAL_TABLET | ORAL | Status: DC

## 2015-09-03 MED ORDER — BENZONATATE 100 MG PO CAPS
100.0000 mg | ORAL_CAPSULE | Freq: Two times a day (BID) | ORAL | Status: DC | PRN
Start: 2015-09-03 — End: 2015-09-24

## 2015-09-03 NOTE — Patient Instructions (Addendum)
Nurse bP check in 2 weeks  MD follow up in 3 months  Medication sent for allergies and excess cough  If nausea returns , you will need to see GI Doc  I recommend you return to mental health  Need to stop alcohol use, Blood pressure extremely high, you need to take medication which you already have at home reportedly  Labs today inc microalb.  Flu vaccine today
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
## 2015-09-04 ENCOUNTER — Ambulatory Visit (HOSPITAL_COMMUNITY): Payer: Self-pay

## 2015-09-04 ENCOUNTER — Other Ambulatory Visit: Payer: Self-pay

## 2015-09-04 ENCOUNTER — Telehealth: Payer: Self-pay | Admitting: Family Medicine

## 2015-09-04 LAB — MICROALBUMIN / CREATININE URINE RATIO
Creatinine, Urine: 130 mg/dL (ref 20–320)
Microalb Creat Ratio: 1191 mcg/mg creat — ABNORMAL HIGH (ref <30)
Microalb, Ur: 154.8 mg/dL

## 2015-09-04 NOTE — Telephone Encounter (Signed)
[name] is calling stating that her insurance does not cover the benzonatate (TESSALON) 100 MG capsule  Is there something else that she can take that her insurance would cover?

## 2015-09-05 NOTE — Telephone Encounter (Signed)
Called patient on both listed numbers.  Unable to leave message.    Cough medications are not covered under her insurance.   She may get the medication at [facility] for $4.

## 2015-09-13 DIAGNOSIS — I509 Heart failure, unspecified: Secondary | ICD-10-CM | POA: Diagnosis not present

## 2015-09-15 ENCOUNTER — Encounter: Payer: Self-pay | Admitting: Family Medicine

## 2015-09-15 NOTE — Assessment & Plan Note (Signed)

## 2015-09-15 NOTE — Assessment & Plan Note (Signed)
An ongoing problem which places pt at high risk of premature mortality and the burden of debility, this is again explained

## 2015-09-15 NOTE — Assessment & Plan Note (Signed)
Uncontrolled, non compliant with medication and ongoing drug  And /or alcohol use Reminded her of inc risk of stroke, will take medication on her return home she promises Denies headache, chest pain or gait instability, present with her Aide who will take her ho

## 2015-09-15 NOTE — Assessment & Plan Note (Signed)
An ongoing challenge based on recen long absence form follow up and malignant hypertension on presentation, pt in denial about the severity of this problem on her health and life

## 2015-09-15 NOTE — Assessment & Plan Note (Signed)
[name] is reminded of the importance of commitment to daily physical activity for 30 minutes or more, as able and the need to limit carbohydrate intake to 30 to 60 grams per meal to help with blood sugar control.   The need to take medication as prescribed, test blood sugar as directed, and to call between visits if there is a concern that blood sugar is uncontrolled is also discussed.   [name] is reminded of the importance of daily foot exam, annual eye examination, and good blood sugar, blood pressure and cholesterol control. Updated lab needed at/ before next visit.   Diabetic Labs Latest Ref Rng 09/03/2015 04/03/2015 01/22/2015 10/01/2014 09/13/2014  HbA1c <5.7 % - - 7.0(H) - -  Microalbumin Not estab mg/dL 154.8 - - - -  Micro/Creat Ratio <30 mcg/mg creat 1191(H) - - - -  Chol 0 - 200 mg/dL - - 236(H) - -  HDL >=46 mg/dL - - 41(L) - -  Calc LDL 0 - 99 mg/dL - - 144(H) - -  Triglycerides <150 mg/dL - - 255(H) - -  Creatinine 0.44 - 1.00 mg/dL - 1.14(H) 1.15(H) 1.3(H) 1.91(H)  GFR >60.00 mL/min - - - 54.70(L) -   BP/Weight 09/03/2015 05/09/2015 04/04/2015 04/03/2015 03/11/2015 0000000 [id number]  Systolic BP [id number] - [id number] - 123456 [id number]  Diastolic BP [id number] - [id number] - 90 114 129  Wt. (Lbs) 189 186 - 186 189 192 184.4  BMI 35.73 35.16 - 35.16 35.73 36.3 34.86  Some encounter information is confidential and restricted. Go to Review Flowsheets activity to see all data.   Foot/eye exam completion dates Latest Ref Rng 03/26/2015 10/01/2014  Eye Exam No Retinopathy Retinopathy(A) -  Foot exam Order - - -  Foot Form Completion - - Done

## 2015-09-15 NOTE — Assessment & Plan Note (Signed)

## 2015-09-16 ENCOUNTER — Other Ambulatory Visit: Payer: Self-pay | Admitting: Endocrinology

## 2015-09-16 ENCOUNTER — Other Ambulatory Visit: Payer: Self-pay | Admitting: Family Medicine

## 2015-09-24 ENCOUNTER — Ambulatory Visit: Payer: Medicare Other

## 2015-09-24 DIAGNOSIS — E1169 Type 2 diabetes mellitus with other specified complication: Secondary | ICD-10-CM

## 2015-09-24 DIAGNOSIS — E669 Obesity, unspecified: Secondary | ICD-10-CM

## 2015-09-24 DIAGNOSIS — J441 Chronic obstructive pulmonary disease with (acute) exacerbation: Secondary | ICD-10-CM

## 2015-09-24 DIAGNOSIS — I10 Essential (primary) hypertension: Secondary | ICD-10-CM

## 2015-09-24 MED ORDER — BENZONATATE 100 MG PO CAPS: 100.0000 mg | ORAL_CAPSULE | Freq: Two times a day (BID) | ORAL | Status: DC | PRN

## 2015-09-24 NOTE — Progress Notes (Signed)
Patient in for nurse blood pressure check.   Blood pressure checked manually in left arm.  Blood pressure elevated.  Called pharmacy and confirmed with tech that patient was due to collect medication on 11/10.  She will go and collect this today and take medications as prescribed.  Will keep next scheduled visit and call before with any problems.    She is also requesting a referral to podiatry for diabetic foot care.
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
## 2015-10-13 DIAGNOSIS — I509 Heart failure, unspecified: Secondary | ICD-10-CM | POA: Diagnosis not present

## 2015-10-14 ENCOUNTER — Other Ambulatory Visit: Payer: Self-pay | Admitting: Family Medicine

## 2015-10-30 ENCOUNTER — Other Ambulatory Visit: Payer: Self-pay | Admitting: Family Medicine

## 2015-10-30 ENCOUNTER — Telehealth: Payer: Self-pay | Admitting: Family Medicine

## 2015-10-30 ENCOUNTER — Other Ambulatory Visit: Payer: Self-pay

## 2015-10-30 MED ORDER — ALBUTEROL SULFATE HFA 108 (90 BASE) MCG/ACT IN AERS: INHALATION_SPRAY | RESPIRATORY_TRACT | Status: DC

## 2015-10-30 MED ORDER — BUDESONIDE-FORMOTEROL FUMARATE 80-4.5 MCG/ACT IN AERO: INHALATION_SPRAY | RESPIRATORY_TRACT | Status: DC

## 2015-10-30 NOTE — Telephone Encounter (Signed)
meds refilled 

## 2015-10-30 NOTE — Telephone Encounter (Signed)
Patient is calling asking for refills on SYMBICORT 80-4.5 MCG/ACT inhaler ANDPROAIR HFA 108 (90 BASE) MCG/ACT inhaler  She is completely out, please advise

## 2015-11-05 ENCOUNTER — Other Ambulatory Visit: Payer: Self-pay | Admitting: Family Medicine

## 2015-11-05 ENCOUNTER — Other Ambulatory Visit: Payer: Self-pay | Admitting: Endocrinology

## 2015-11-13 DIAGNOSIS — I509 Heart failure, unspecified: Secondary | ICD-10-CM | POA: Diagnosis not present

## 2015-11-14 ENCOUNTER — Telehealth: Payer: Self-pay

## 2015-11-14 DIAGNOSIS — N39498 Other specified urinary incontinence: Secondary | ICD-10-CM

## 2015-11-14 DIAGNOSIS — R32 Unspecified urinary incontinence: Secondary | ICD-10-CM | POA: Insufficient documentation

## 2015-11-14 NOTE — Telephone Encounter (Signed)
Written, pls fax

## 2015-11-15 NOTE — Telephone Encounter (Signed)
rx faxed to [facility]

## 2015-12-03 ENCOUNTER — Other Ambulatory Visit: Payer: Self-pay | Admitting: Family Medicine

## 2015-12-05 ENCOUNTER — Ambulatory Visit: Payer: Self-pay | Admitting: Family Medicine

## 2015-12-14 DIAGNOSIS — I509 Heart failure, unspecified: Secondary | ICD-10-CM | POA: Diagnosis not present

## 2015-12-26 ENCOUNTER — Other Ambulatory Visit: Payer: Self-pay | Admitting: Family Medicine

## 2016-01-11 DIAGNOSIS — I509 Heart failure, unspecified: Secondary | ICD-10-CM | POA: Diagnosis not present

## 2016-01-16 ENCOUNTER — Ambulatory Visit: Payer: Self-pay | Admitting: Family Medicine

## 2016-01-17 ENCOUNTER — Other Ambulatory Visit: Payer: Self-pay | Admitting: Family Medicine

## 2016-02-04 ENCOUNTER — Ambulatory Visit: Payer: Self-pay | Admitting: Family Medicine

## 2016-02-11 DIAGNOSIS — I509 Heart failure, unspecified: Secondary | ICD-10-CM | POA: Diagnosis not present

## 2016-02-24 ENCOUNTER — Other Ambulatory Visit: Payer: Self-pay | Admitting: Family Medicine

## 2016-02-24 ENCOUNTER — Telehealth: Payer: Self-pay

## 2016-02-24 MED ORDER — ALBUTEROL SULFATE HFA 108 (90 BASE) MCG/ACT IN AERS: 2.0000 | INHALATION_SPRAY | Freq: Four times a day (QID) | RESPIRATORY_TRACT | Status: DC | PRN

## 2016-02-24 MED ORDER — ALBUTEROL SULFATE HFA 108 (90 BASE) MCG/ACT IN AERS: INHALATION_SPRAY | RESPIRATORY_TRACT | Status: DC

## 2016-02-24 MED ORDER — PREDNISONE 5 MG (21) PO TBPK: 5.0000 mg | ORAL_TABLET | ORAL | Status: DC

## 2016-02-24 MED ORDER — BUDESONIDE-FORMOTEROL FUMARATE 80-4.5 MCG/ACT IN AERO: INHALATION_SPRAY | RESPIRATORY_TRACT | Status: DC

## 2016-02-24 NOTE — Telephone Encounter (Signed)
Patient is calling stating that she needs a refill on both inhalers PROAIR HFA 108 (90 Base) MCG/ACT inhaler and SYMBICORT 80-4.5 MCG/ACT inhaler due to the warm humid climate, its making it harder for her to breathe and she is having to use more than 2 puffs at this time, please advise?

## 2016-02-24 NOTE — Telephone Encounter (Signed)
2 puffs is the max dose. Will make patient aware

## 2016-02-24 NOTE — Telephone Encounter (Signed)
Pt aware- has appt for Friday

## 2016-02-24 NOTE — Telephone Encounter (Signed)
I re sent the proventil and a prednisone dose pack If unable to breathe needs to go to [facility]  Needs to make and keep OV , has not been in for over 5 months.

## 2016-02-24 NOTE — Telephone Encounter (Signed)
States she is out of her inhalers (proair and symbicort) and its too early to get them refilled again because she uses 3 puffs at a time and maybe three times a day. I advised her to only use them as directed and she said she has to use them when she can't breathe and she is still having problems now and she is out of inhalers and wants to know if you can call her in something for SOB and wheezing. Please advise

## 2016-02-24 NOTE — Telephone Encounter (Signed)
Patient is stating that the instructions need to be changed so that the insurance will pay

## 2016-02-25 ENCOUNTER — Other Ambulatory Visit: Payer: Self-pay | Admitting: Family Medicine

## 2016-02-28 ENCOUNTER — Encounter: Payer: Self-pay | Admitting: Family Medicine

## 2016-02-28 ENCOUNTER — Ambulatory Visit (INDEPENDENT_AMBULATORY_CARE_PROVIDER_SITE_OTHER): Payer: Medicare Other | Admitting: Family Medicine

## 2016-02-28 VITALS — BP 180/120 | HR 103 | Resp 20 | Ht 61.0 in | Wt 198.1 lb

## 2016-02-28 DIAGNOSIS — E559 Vitamin D deficiency, unspecified: Secondary | ICD-10-CM | POA: Diagnosis not present

## 2016-02-28 DIAGNOSIS — Z9119 Patient's noncompliance with other medical treatment and regimen: Secondary | ICD-10-CM

## 2016-02-28 DIAGNOSIS — E119 Type 2 diabetes mellitus without complications: Secondary | ICD-10-CM

## 2016-02-28 DIAGNOSIS — E1169 Type 2 diabetes mellitus with other specified complication: Secondary | ICD-10-CM

## 2016-02-28 DIAGNOSIS — I10 Essential (primary) hypertension: Secondary | ICD-10-CM | POA: Diagnosis not present

## 2016-02-28 DIAGNOSIS — Z1231 Encounter for screening mammogram for malignant neoplasm of breast: Secondary | ICD-10-CM

## 2016-02-28 DIAGNOSIS — J441 Chronic obstructive pulmonary disease with (acute) exacerbation: Secondary | ICD-10-CM

## 2016-02-28 DIAGNOSIS — F17219 Nicotine dependence, cigarettes, with unspecified nicotine-induced disorders: Secondary | ICD-10-CM | POA: Diagnosis not present

## 2016-02-28 DIAGNOSIS — I6381 Other cerebral infarction due to occlusion or stenosis of small artery: Secondary | ICD-10-CM

## 2016-02-28 DIAGNOSIS — E669 Obesity, unspecified: Secondary | ICD-10-CM

## 2016-02-28 DIAGNOSIS — Z91199 Patient's noncompliance with other medical treatment and regimen due to unspecified reason: Secondary | ICD-10-CM

## 2016-02-28 DIAGNOSIS — E785 Hyperlipidemia, unspecified: Secondary | ICD-10-CM

## 2016-02-28 DIAGNOSIS — I639 Cerebral infarction, unspecified: Secondary | ICD-10-CM

## 2016-02-28 LAB — CBC
HCT: 46 % — ABNORMAL HIGH (ref 35.0–45.0)
Hemoglobin: 15.4 g/dL (ref 11.7–15.5)
MCH: 33.1 pg — ABNORMAL HIGH (ref 27.0–33.0)
MCHC: 33.5 g/dL (ref 32.0–36.0)
MCV: 98.9 fL (ref 80.0–100.0)
MPV: 11.2 fL (ref 7.5–12.5)
Platelets: 263 10*3/uL (ref 140–400)
RBC: 4.65 MIL/uL (ref 3.80–5.10)
RDW: 14.7 % (ref 11.0–15.0)
WBC: 6.3 10*3/uL (ref 3.8–10.8)

## 2016-02-28 LAB — COMPLETE METABOLIC PANEL WITH GFR
ALT: 16 U/L (ref 6–29)
AST: 16 U/L (ref 10–35)
Albumin: 4 g/dL (ref 3.6–5.1)
Alkaline Phosphatase: 75 U/L (ref 33–130)
BUN: 20 mg/dL (ref 7–25)
CO2: 27 mmol/L (ref 20–31)
Calcium: 9.7 mg/dL (ref 8.6–10.4)
Chloride: 98 mmol/L (ref 98–110)
Creat: 1.37 mg/dL — ABNORMAL HIGH (ref 0.50–1.05)
GFR, Est African American: 52 mL/min — ABNORMAL LOW (ref >=60)
GFR, Est Non African American: 45 mL/min — ABNORMAL LOW (ref >=60)
Glucose, Bld: 138 mg/dL — ABNORMAL HIGH (ref 65–99)
Potassium: 5.6 mmol/L — ABNORMAL HIGH (ref 3.5–5.3)
Sodium: 138 mmol/L (ref 135–146)
Total Bilirubin: 0.7 mg/dL (ref 0.2–1.2)
Total Protein: 6.8 g/dL (ref 6.1–8.1)

## 2016-02-28 LAB — LIPID PANEL
Cholesterol: 251 mg/dL — ABNORMAL HIGH (ref 125–200)
HDL: 62 mg/dL (ref >=46)
LDL Cholesterol: 129 mg/dL (ref <130)
Total CHOL/HDL Ratio: 4 Ratio (ref <=5.0)
Triglycerides: 300 mg/dL — ABNORMAL HIGH (ref <150)
VLDL: 60 mg/dL — ABNORMAL HIGH (ref <30)

## 2016-02-28 LAB — HEMOGLOBIN A1C
Hgb A1c MFr Bld: 8.2 % — ABNORMAL HIGH (ref <5.7)
Mean Plasma Glucose: 189 mg/dL

## 2016-02-28 NOTE — Progress Notes (Signed)
Subjective:    Patient ID: [name], female    DOB: 05-23-1966, 50 y.o.   MRN: [id number]  HPI   [name]     MRN: [id number]      DOB: 07-07-1966   HPI [name] is here for follow up and re-evaluation of chronic medical conditions, medication management and review of any available recent lab and radiology data.  Preventive health is updated, specifically  Cancer screening and Immunization.   Questions or concerns regarding consultations or procedures which the PT has had in the interim are  addressed. The PT denies any adverse reactions to current medications since the last visit.  There are no new concerns.  There are no specific complaints   ROS Denies recent fever or chills. Denies sinus pressure, nasal congestion, ear pain or sore throat. Denies chest congestion, productive cough or wheezing. Denies chest pains, palpitations and leg swelling Denies abdominal pain, nausea, vomiting,diarrhea or constipation.   Denies dysuria, frequency, hesitancy or incontinence. Denies joint pain, swelling and limitation in mobility. Denies headaches, seizures, numbness, or tingling. Denies depression, anxiety or insomnia. Denies skin break down or rash.   PE  BP 180/120 mmHg  Pulse 103  Resp 20  Ht 5\' 1"  (1.549 m)  Wt 198 lb 1.9 oz (89.867 kg)  BMI 37.45 kg/m2  SpO2 95%  Patient alert and oriented and in no cardiopulmonary distress.  HEENT: No facial asymmetry, EOMI,   oropharynx pink and moist.  Neck supple no JVD, no mass.  Chest: decreased air entry, few wheezes, no crackles  CVS: S1, S2 no murmurs, no S3.Regular rate.  ABD: Soft non tender.   Ext: No edema  MS: Adequate ROM spine, shoulders, hips and knees.  Skin: Intact, no ulcerations or rash noted.  Psych: Good eye contact, normal affect. Memory intact not anxious or depressed appearing.  CNS: CN 2-12 intact, power,  normal throughout.   Assessment & Plan   Cigarette nicotine dependence  with nicotine-induced disorder Patient counseled for approximately 5 minutes regarding the health risks of ongoing nicotine use, specifically all types of cancer, heart disease, stroke and respiratory failure. The options available for help with cessation ,the behavioral changes to assist the process, and the option to either gradully reduce usage  Or abruptly stop.is also discussed. Pt is also encouraged to set specific goals in number of cigarettes used daily, as well as to set a quit date.  Number of cigarettes/cigars currently smoking daily:20   HTN (hypertension), malignant Uncontrolled, remaoins medically non compliant and has ahd complications as a result of uncontrolled hTN including CVa DASH diet and commitment to daily physical activity for a minimum of 30 minutes discussed and encouraged, as a part of hypertension management. The importance of attaining a healthy weight is also discussed.  BP/Weight 02/28/2016 09/03/2015 05/09/2015 04/04/2015 04/03/2015 03/11/2015 0000000  Systolic BP 99991111 99991111 - [id number] - 123456 A999333  Diastolic BP 123456 AB-[id number] - A999333 - 90 114  Wt. (Lbs) 198.12 189 186 - 186 189 192  BMI 37.45 35.73 35.16 - 35.16 35.73 36.3        Diabetes mellitus type 2 in obese Deteriorated [name] is reminded of the importance of commitment to daily physical activity for 30 minutes or more, as able and the need to limit carbohydrate intake to 30 to 60 grams per meal to help with blood sugar control.   The need to take medication as prescribed, test blood sugar as directed, and to call between  visits if there is a concern that blood sugar is uncontrolled is also discussed.   [name] is reminded of the importance of daily foot exam, annual eye examination, and good blood sugar, blood pressure and cholesterol control.  Diabetic Labs Latest Ref Rng 02/28/2016 09/03/2015 04/03/2015 01/22/2015 10/01/2014  HbA1c <5.7 % 8.2(H) - - 7.0(H) -  Microalbumin Not estab mg/dL - 154.8 - - -  Micro/Creat  Ratio <30 mcg/mg creat - 1191(H) - - -  Chol 125 - 200 mg/dL 251(H) - - 236(H) -  HDL >=46 mg/dL 62 - - 41(L) -  Calc LDL <130 mg/dL 129 - - 144(H) -  Triglycerides <150 mg/dL 300(H) - - 255(H) -  Creatinine 0.50 - 1.05 mg/dL 1.37(H) - 1.14(H) 1.15(H) 1.3(H)  GFR >60.00 mL/min - - - - 54.70(L)   BP/Weight 02/28/2016 09/03/2015 05/09/2015 04/04/2015 04/03/2015 03/11/2015 0000000  Systolic BP 99991111 99991111 - [id number] - 123456 A999333  Diastolic BP 123456 AB-123456789 - A999333 - 90 114  Wt. (Lbs) 198.12 189 186 - 186 189 192  BMI 37.45 35.73 35.16 - 35.16 35.73 36.3   Foot/eye exam completion dates Latest Ref Rng 02/28/2016 03/26/2015  Eye Exam No Retinopathy - Retinopathy(A)  Foot exam Order - - -  Foot Form Completion - Done -         Medical non-compliance Ongoing concern, increased morbidity and mortality as a result  Thalamic infarct, acute High risk for recurrence due to non compliance, advised of this, no current residual sesory motor deficits  Hyperlipidemia LDL goal <70 Uncontrolled and deteriorated Hyperlipidemia:Low fat diet discussed and encouraged.   Lipid Panel  Lab Results  Component Value Date   CHOL 251* 02/28/2016   HDL 62 02/28/2016   LDLCALC 129 02/28/2016   LDLDIRECT 122* 01/08/2010   TRIG 300* 02/28/2016   CHOLHDL 4.0 02/28/2016   If compliant with crestor will  Need to add a 2nd agent , will need to f/i as pt did not bring meds tovosot          Review of Systems     Objective:   Physical Exam        Assessment & Plan:

## 2016-02-28 NOTE — Assessment & Plan Note (Signed)

## 2016-02-28 NOTE — Patient Instructions (Addendum)
Annual physical exam in 7 weeks, call if you need me sooner  Labs today  You are referred for a mammogram  You are referred to podiatry and to Dr Johnell Comings are referred for lung function testing  Work on stopping smoking to reduce risk of cancer , stroke and heart attack  Thanks for choosing [facility], we consider it a privelige to serve you.

## 2016-02-29 LAB — VITAMIN D 25 HYDROXY (VIT D DEFICIENCY, FRACTURES): Vit D, 25-Hydroxy: 10 ng/mL — ABNORMAL LOW (ref 30–100)

## 2016-02-29 LAB — TSH: TSH: 1.49 mIU/L

## 2016-03-01 DIAGNOSIS — E782 Mixed hyperlipidemia: Secondary | ICD-10-CM | POA: Insufficient documentation

## 2016-03-01 DIAGNOSIS — E785 Hyperlipidemia, unspecified: Secondary | ICD-10-CM | POA: Insufficient documentation

## 2016-03-01 NOTE — Assessment & Plan Note (Signed)
Continue current prednisone dose pack, s , needs to d/c smoking, order PFT and refer to pulmonary

## 2016-03-01 NOTE — Assessment & Plan Note (Signed)
Ongoing concern, increased morbidity and mortality as a result

## 2016-03-01 NOTE — Assessment & Plan Note (Signed)
Deteriorated [name] is reminded of the importance of commitment to daily physical activity for 30 minutes or more, as able and the need to limit carbohydrate intake to 30 to 60 grams per meal to help with blood sugar control.   The need to take medication as prescribed, test blood sugar as directed, and to call between visits if there is a concern that blood sugar is uncontrolled is also discussed.   [name] is reminded of the importance of daily foot exam, annual eye examination, and good blood sugar, blood pressure and cholesterol control.  Diabetic Labs Latest Ref Rng 02/28/2016 09/03/2015 04/03/2015 01/22/2015 10/01/2014  HbA1c <5.7 % 8.2(H) - - 7.0(H) -  Microalbumin Not estab mg/dL - 154.8 - - -  Micro/Creat Ratio <30 mcg/mg creat - 1191(H) - - -  Chol 125 - 200 mg/dL 251(H) - - 236(H) -  HDL >=46 mg/dL 62 - - 41(L) -  Calc LDL <130 mg/dL 129 - - 144(H) -  Triglycerides <150 mg/dL 300(H) - - 255(H) -  Creatinine 0.50 - 1.05 mg/dL 1.37(H) - 1.14(H) 1.15(H) 1.3(H)  GFR >60.00 mL/min - - - - 54.70(L)   BP/Weight 02/28/2016 09/03/2015 05/09/2015 04/04/2015 04/03/2015 03/11/2015 0000000  Systolic BP 99991111 99991111 - [id number] - 123456 A999333  Diastolic BP 123456 AB-123456789 - A999333 - 90 114  Wt. (Lbs) 198.12 189 186 - 186 189 192  BMI 37.45 35.73 35.16 - 35.16 35.73 36.3   Foot/eye exam completion dates Latest Ref Rng 02/28/2016 03/26/2015  Eye Exam No Retinopathy - Retinopathy(A)  Foot exam Order - - -  Foot Form Completion - Done -

## 2016-03-01 NOTE — Assessment & Plan Note (Signed)
Uncontrolled and deteriorated Hyperlipidemia:Low fat diet discussed and encouraged.   Lipid Panel  Lab Results  Component Value Date   CHOL 251* 02/28/2016   HDL 62 02/28/2016   LDLCALC 129 02/28/2016   LDLDIRECT 122* 01/08/2010   TRIG 300* 02/28/2016   CHOLHDL 4.0 02/28/2016   If compliant with crestor will  Need to add a 2nd agent , will need to f/i as pt did not bring meds tovosot

## 2016-03-01 NOTE — Assessment & Plan Note (Addendum)
High risk for recurrence due to non compliance, advised of this, no current residual sesory motor deficits

## 2016-03-01 NOTE — Assessment & Plan Note (Signed)
Uncontrolled, remaoins medically non compliant and has ahd complications as a result of uncontrolled hTN including CVa DASH diet and commitment to daily physical activity for a minimum of 30 minutes discussed and encouraged, as a part of hypertension management. The importance of attaining a healthy weight is also discussed.  BP/Weight 02/28/2016 09/03/2015 05/09/2015 04/04/2015 04/03/2015 03/11/2015 0000000  Systolic BP 99991111 99991111 - [id number] - 123456 A999333  Diastolic BP 123456 [id number] - A999333 - 90 114  Wt. (Lbs) 198.12 189 186 - 186 189 192  BMI 37.45 35.73 35.16 - 35.16 35.73 36.3

## 2016-03-06 ENCOUNTER — Ambulatory Visit (HOSPITAL_COMMUNITY)
Admission: RE | Admit: 2016-03-06 | Discharge: 2016-03-06 | Disposition: A | Discharge status: Home / Self Care | Payer: Medicare Other | Source: Ambulatory Visit | Attending: Family Medicine | Admitting: Family Medicine

## 2016-03-06 DIAGNOSIS — J441 Chronic obstructive pulmonary disease with (acute) exacerbation: Secondary | ICD-10-CM

## 2016-03-06 LAB — PULMONARY FUNCTION TEST
DL/VA % pred: 118 %
DL/VA: 5.2 ml/min/mmHg/L
DLCO unc % pred: 75 %
DLCO unc: 15.31 ml/min/mmHg
FEF 25-75 Post: 0.37 L/sec
FEF 25-75 Pre: 1.04 L/sec
FEF2575-%Change-Post: -64 %
FEF2575-%Pred-Post: 16 %
FEF2575-%Pred-Pre: 47 %
FEV1-%Change-Post: -17 %
FEV1-%Pred-Post: 48 %
FEV1-%Pred-Pre: 58 %
FEV1-Post: 0.97 L
FEV1-Pre: 1.18 L
FEV1FVC-%Change-Post: -7 %
FEV1FVC-%Pred-Pre: 96 %
FEV6-%Change-Post: -9 %
FEV6-%Pred-Post: 55 %
FEV6-%Pred-Pre: 61 %
FEV6-Post: 1.35 L
FEV6-Pre: 1.5 L
FEV6FVC-%Pred-Post: 103 %
FEV6FVC-%Pred-Pre: 103 %
FVC-%Change-Post: -10 %
FVC-%Pred-Post: 53 %
FVC-%Pred-Pre: 59 %
FVC-Post: 1.35 L
FVC-Pre: 1.51 L
Post FEV1/FVC ratio: 72 %
Post FEV6/FVC ratio: 100 %
Pre FEV1/FVC ratio: 78 %
Pre FEV6/FVC Ratio: 100 %
RV % pred: 118 %
RV: 1.98 L
TLC % pred: 76 %
TLC: 3.54 L

## 2016-03-06 MED ORDER — ALBUTEROL SULFATE (2.5 MG/3ML) 0.083% IN NEBU
2.5000 mg | INHALATION_SOLUTION | Freq: Once | RESPIRATORY_TRACT | Status: AC
  Administered 2016-03-06: 2.5 mg via RESPIRATORY_TRACT

## 2016-03-09 MED ORDER — REPAGLINIDE 1 MG PO TABS: 1.0000 mg | ORAL_TABLET | Freq: Three times a day (TID) | ORAL | Status: DC

## 2016-03-09 MED ORDER — VITAMIN D (ERGOCALCIFEROL) 1.25 MG (50000 UNIT) PO CAPS: ORAL_CAPSULE | ORAL | Status: DC

## 2016-03-09 NOTE — Addendum Note (Signed)
Addended by: Eual Fines on: 03/09/2016 01:36 PM   Modules accepted: Orders

## 2016-03-10 ENCOUNTER — Other Ambulatory Visit: Payer: Self-pay | Admitting: Family Medicine

## 2016-03-10 DIAGNOSIS — J441 Chronic obstructive pulmonary disease with (acute) exacerbation: Secondary | ICD-10-CM | POA: Diagnosis not present

## 2016-03-10 MED ORDER — CHOLINE FENOFIBRATE 135 MG PO CPDR: 135.0000 mg | DELAYED_RELEASE_CAPSULE | Freq: Every day | ORAL | Status: DC

## 2016-03-10 MED ORDER — IPRATROPIUM-ALBUTEROL 0.5-2.5 (3) MG/3ML IN SOLN: 3.0000 mL | Freq: Four times a day (QID) | RESPIRATORY_TRACT | Status: DC | PRN

## 2016-03-12 DIAGNOSIS — I509 Heart failure, unspecified: Secondary | ICD-10-CM | POA: Diagnosis not present

## 2016-03-17 DIAGNOSIS — E1151 Type 2 diabetes mellitus with diabetic peripheral angiopathy without gangrene: Secondary | ICD-10-CM | POA: Diagnosis not present

## 2016-03-17 DIAGNOSIS — I739 Peripheral vascular disease, unspecified: Secondary | ICD-10-CM | POA: Diagnosis not present

## 2016-03-17 DIAGNOSIS — M19071 Primary osteoarthritis, right ankle and foot: Secondary | ICD-10-CM | POA: Diagnosis not present

## 2016-03-17 DIAGNOSIS — L84 Corns and callosities: Secondary | ICD-10-CM | POA: Diagnosis not present

## 2016-03-17 DIAGNOSIS — M19072 Primary osteoarthritis, left ankle and foot: Secondary | ICD-10-CM | POA: Diagnosis not present

## 2016-03-24 ENCOUNTER — Other Ambulatory Visit: Payer: Self-pay | Admitting: Family Medicine

## 2016-03-25 DIAGNOSIS — J449 Chronic obstructive pulmonary disease, unspecified: Secondary | ICD-10-CM | POA: Diagnosis not present

## 2016-03-25 DIAGNOSIS — I10 Essential (primary) hypertension: Secondary | ICD-10-CM | POA: Diagnosis not present

## 2016-04-03 IMAGING — CR DG CHEST 1V PORT
1 series · 1 of 1 positions shown · non-contrast
Comparison: 11/17/2013

CLINICAL DATA: Cough, congestion, shortness of breath for 2 weeks

EXAM:
PORTABLE CHEST - 1 VIEW

[ap portable]
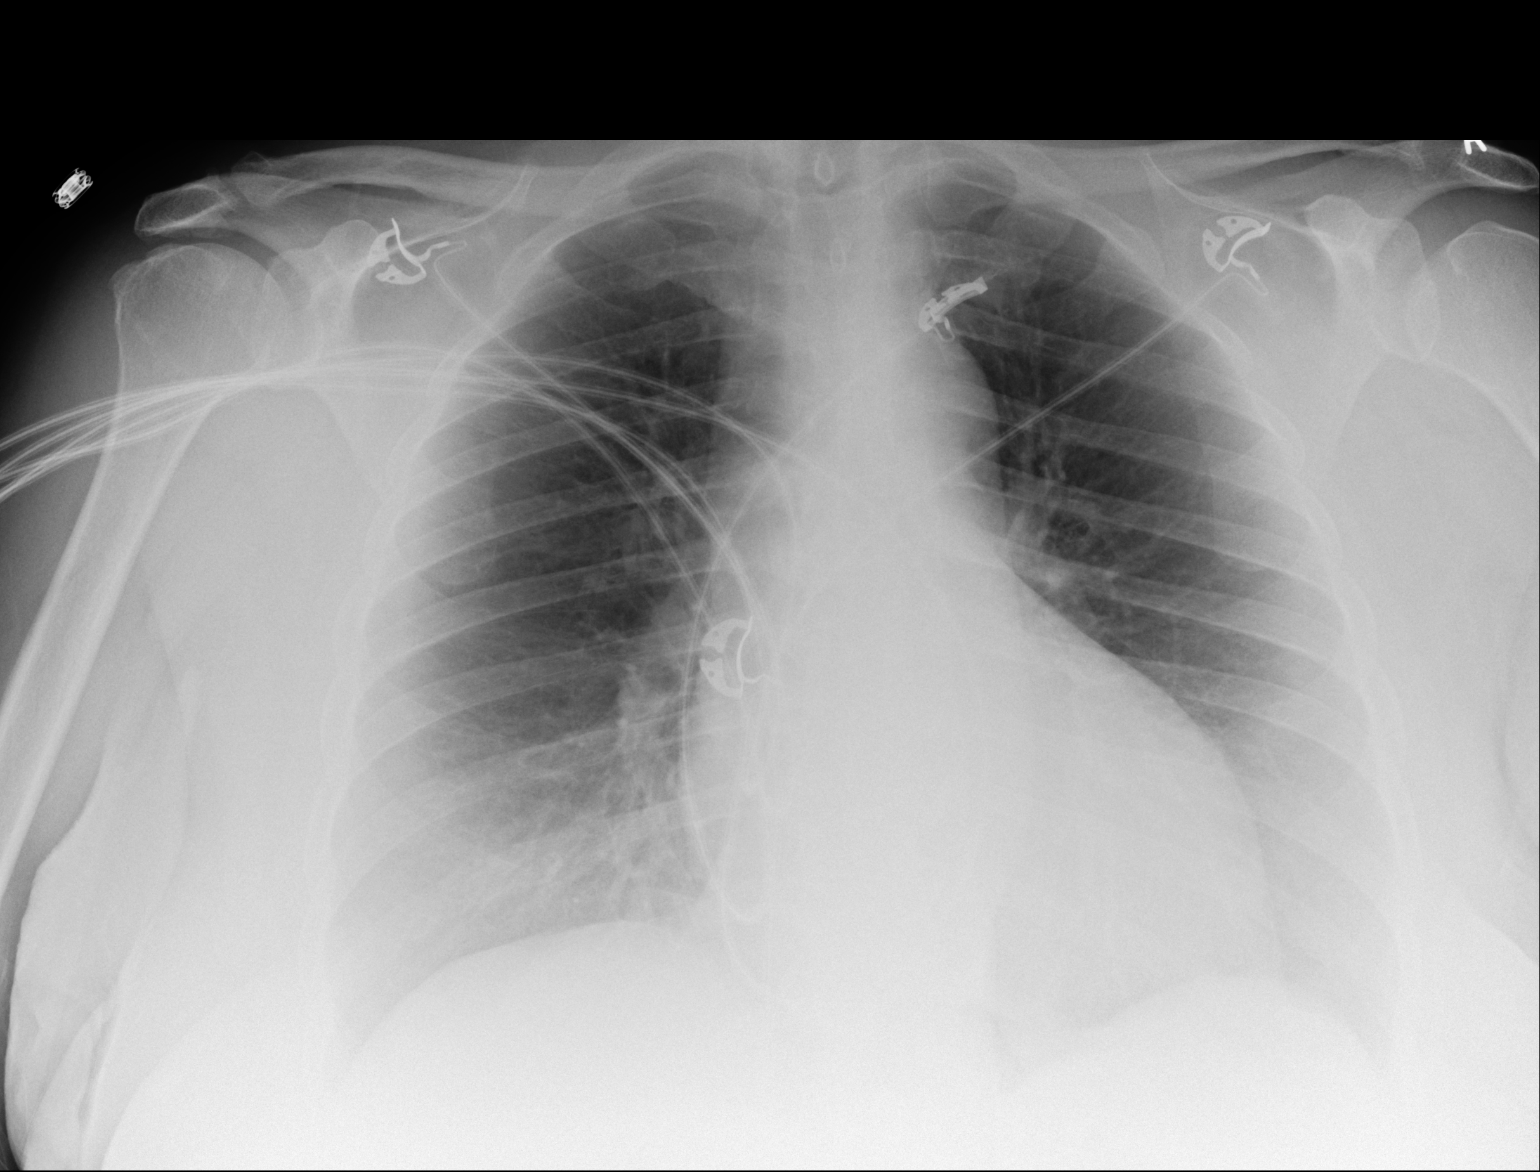

[1 of 1 positions shown; findings below may reference images not displayed]

FINDINGS: Cardiomediastinal silhouette is stable. No acute infiltrate or
pleural effusion. No pulmonary edema. Mild degenerative changes
lower thoracic spine.
IMPRESSION: No active disease.

## 2016-04-03 IMAGING — US US RENAL
1 series · 14 of 25 positions shown · non-contrast
Comparison: Abdominal MRA 05/21/2005

CLINICAL DATA: Acute kidney injury.

EXAM:
RENAL/URINARY TRACT ULTRASOUND COMPLETE

[Series 1: us renal · 0.18mm/px · 14 of 54 slices shown]
[im 1/54]
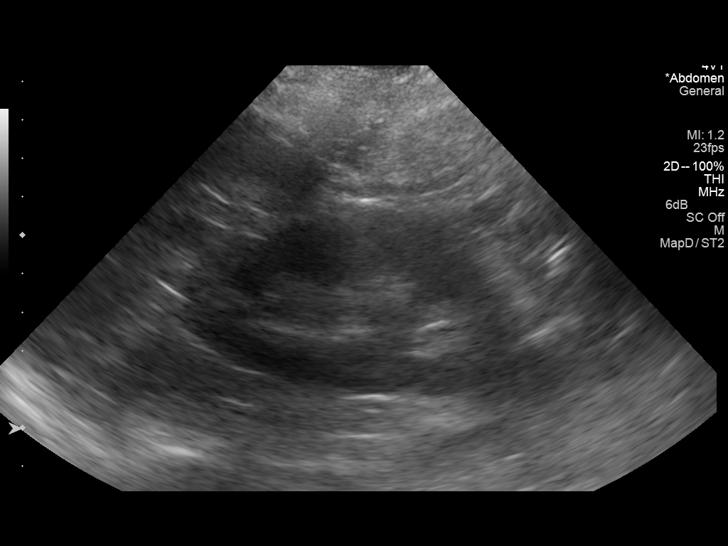
[im 5/54]
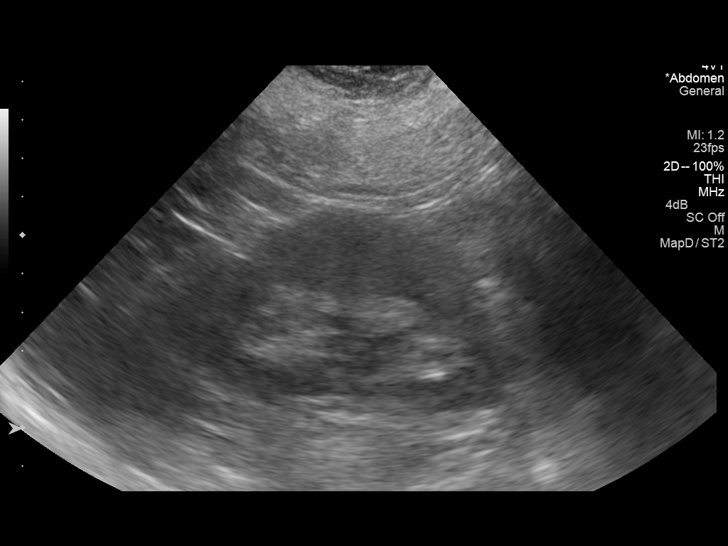
[im 9/54]
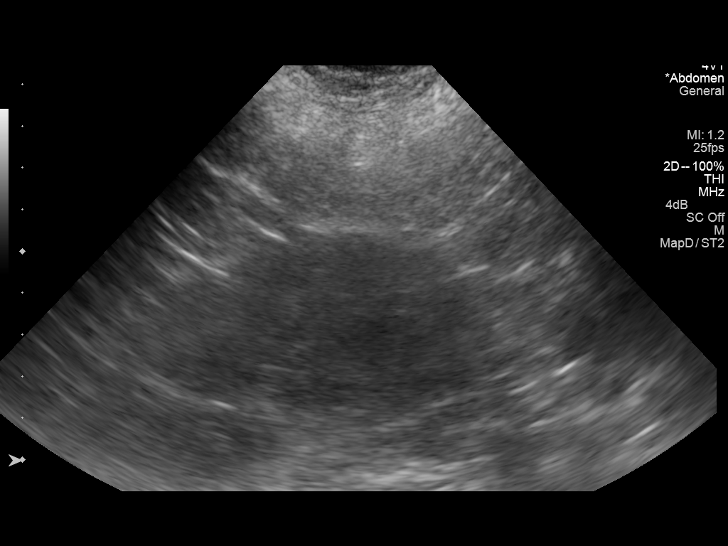
[im 14/54]
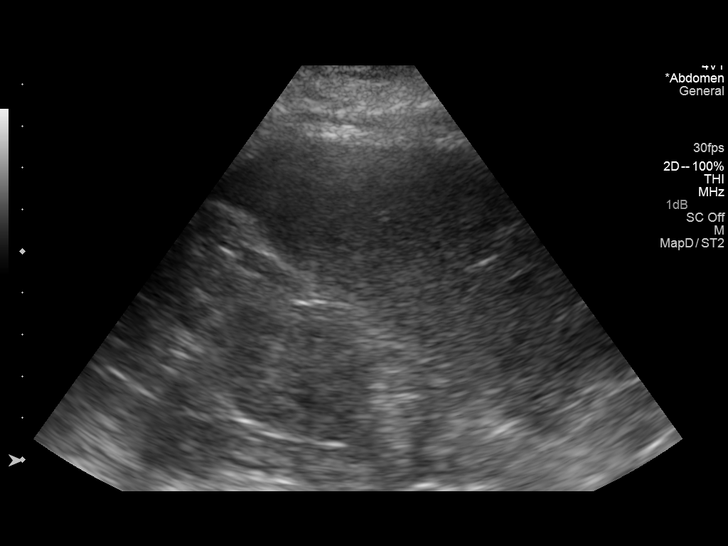
[im 18/54]
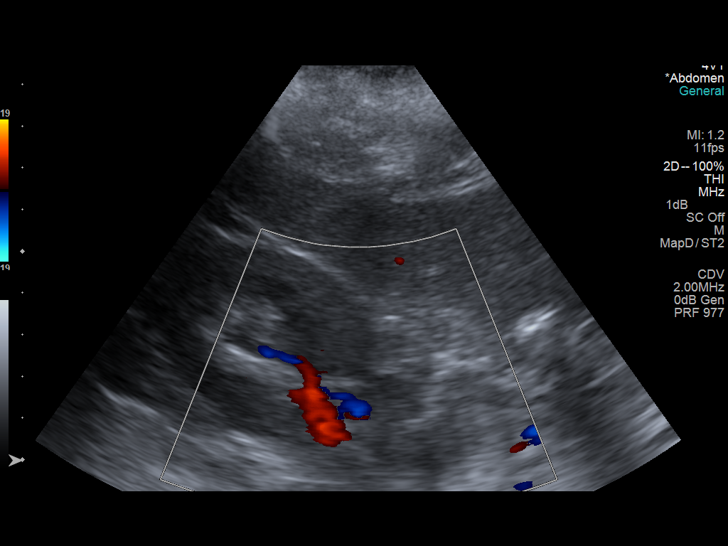
[im 20/54]
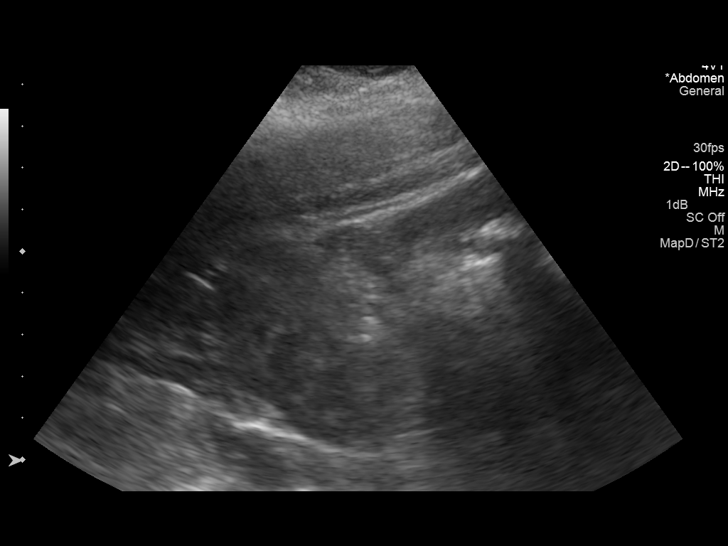
[im 25/54]
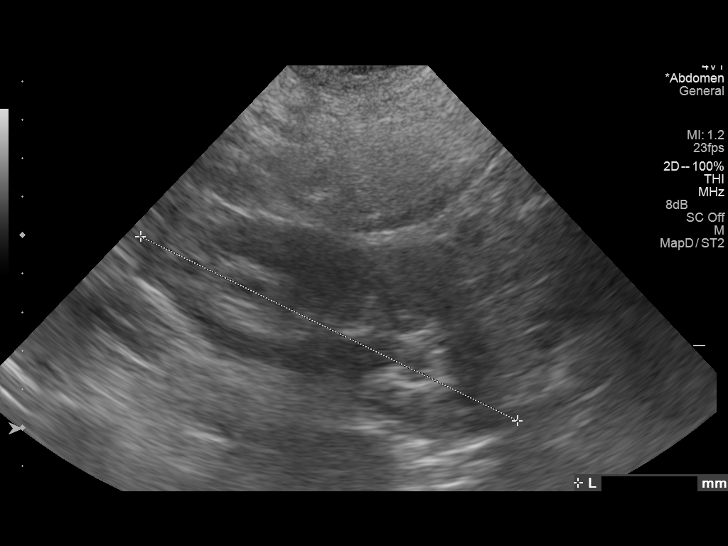
[im 29/54]
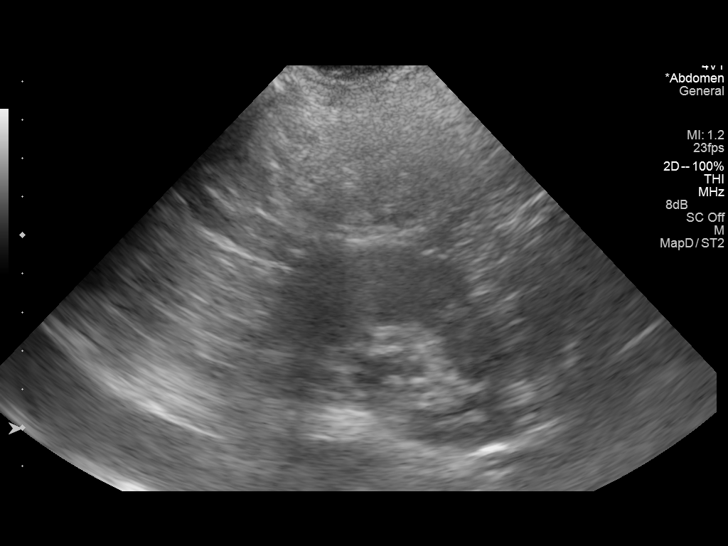
[im 34/54]
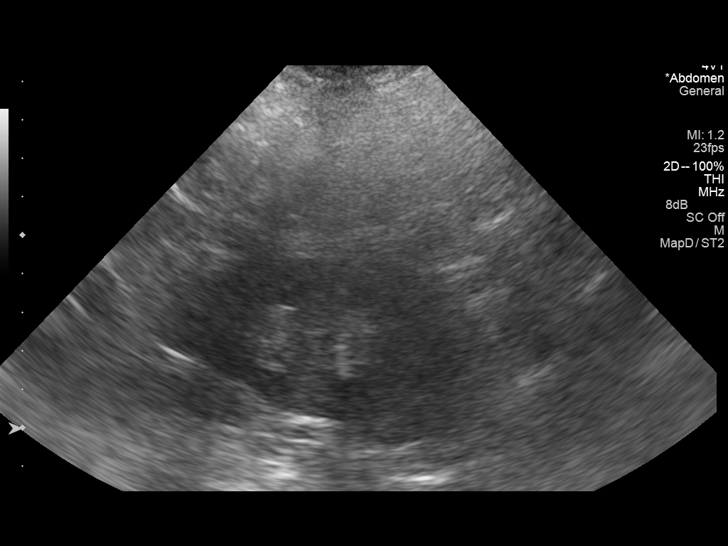
[im 36/54]
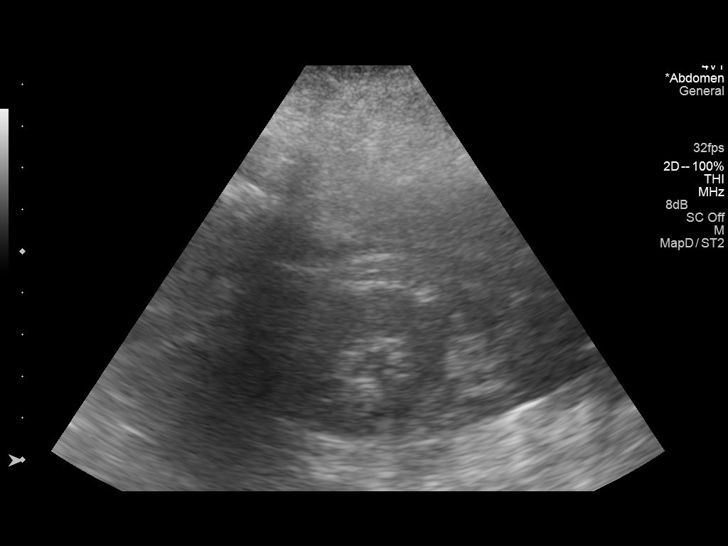
[im 40/54]
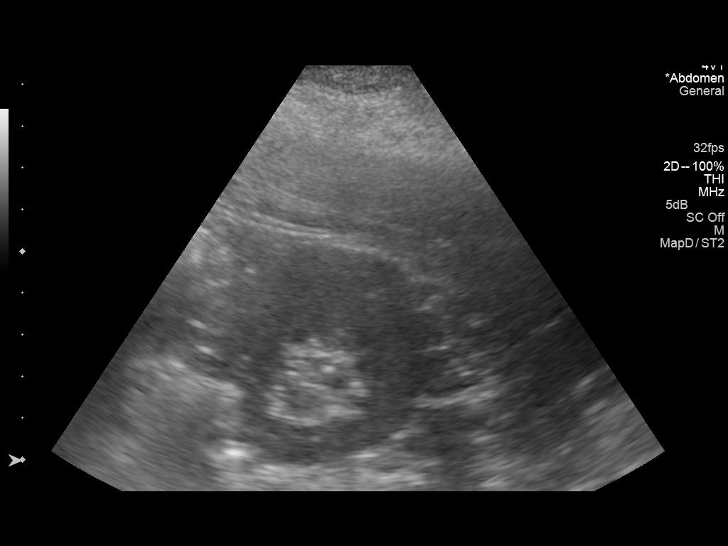
[im 45/54]
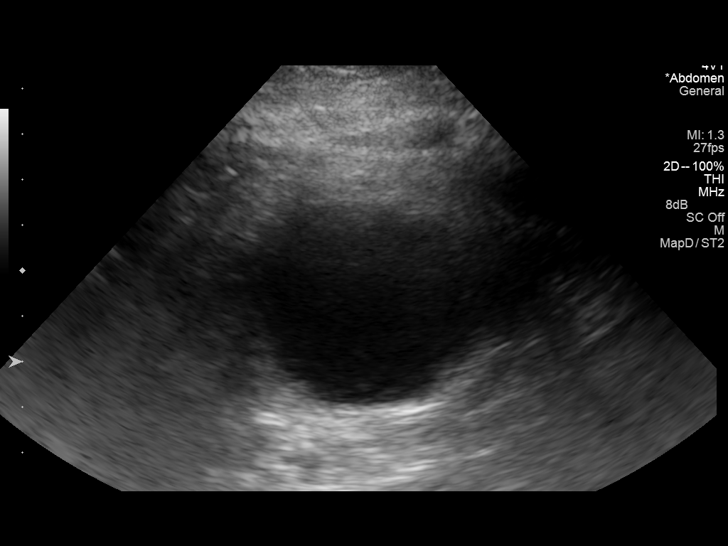
[im 49/54]
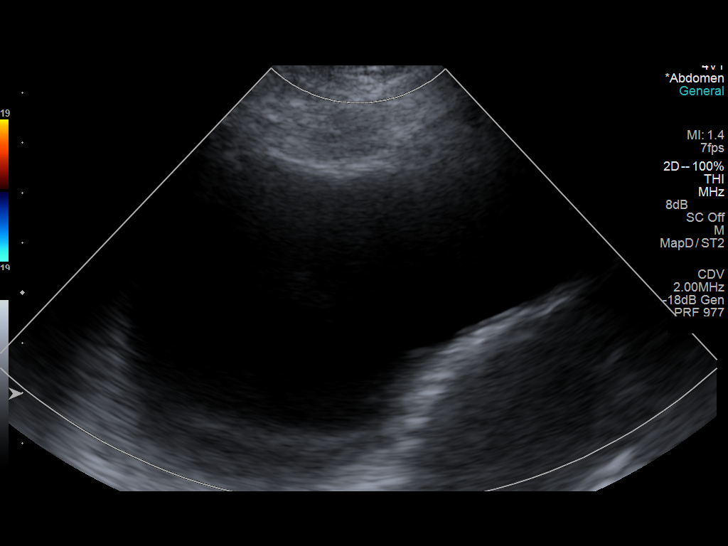
[im 54/54]
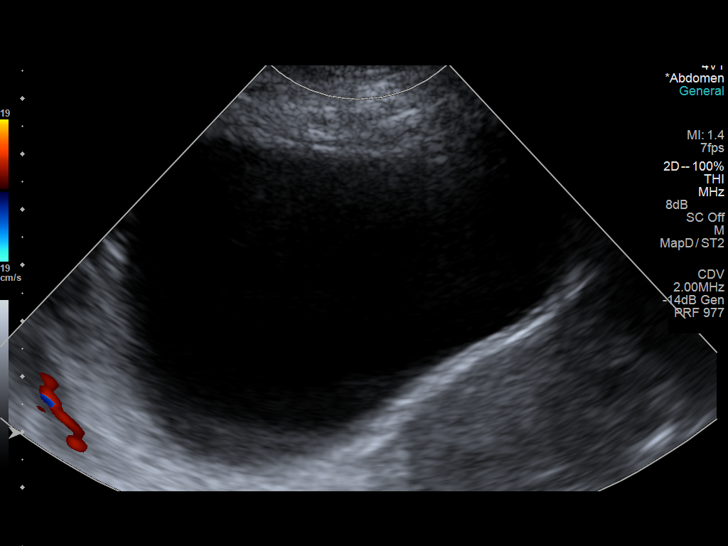

[14 of 25 positions shown; findings below may reference images not displayed]

FINDINGS: Examination at mildly limited by patient body habitus.

Right Kidney:

Length: 11.4 cm. Echogenicity within normal limits. No mass or
hydronephrosis visualized.

Left Kidney:

Length: 10.9 cm. Echogenicity within normal limits. No mass
visualized. Renal pelvis was minimally prominent without true
hydronephrosis.

Bladder:

Appears normal for degree of bladder distention. Ureteral jets not
visualized. Patient had recently voided.
IMPRESSION: Minimal prominence of the left renal pelvis without hydronephrosis.

## 2016-04-10 DIAGNOSIS — J441 Chronic obstructive pulmonary disease with (acute) exacerbation: Secondary | ICD-10-CM | POA: Diagnosis not present

## 2016-04-12 DIAGNOSIS — I509 Heart failure, unspecified: Secondary | ICD-10-CM | POA: Diagnosis not present

## 2016-04-13 ENCOUNTER — Other Ambulatory Visit: Payer: Self-pay | Admitting: Family Medicine

## 2016-05-05 ENCOUNTER — Other Ambulatory Visit: Payer: Self-pay | Admitting: Family Medicine

## 2016-05-07 ENCOUNTER — Other Ambulatory Visit (HOSPITAL_COMMUNITY)
Admission: RE | Admit: 2016-05-07 | Discharge: 2016-05-07 | Disposition: A | Discharge status: Home / Self Care | Payer: Medicare Other | Source: Ambulatory Visit | Attending: Family Medicine | Admitting: Family Medicine

## 2016-05-07 ENCOUNTER — Encounter: Payer: Self-pay | Admitting: Family Medicine

## 2016-05-07 ENCOUNTER — Other Ambulatory Visit: Payer: Self-pay

## 2016-05-07 ENCOUNTER — Ambulatory Visit (INDEPENDENT_AMBULATORY_CARE_PROVIDER_SITE_OTHER): Payer: Medicare Other | Admitting: Family Medicine

## 2016-05-07 VITALS — BP 238/140 | HR 107 | Resp 20 | Ht 61.0 in | Wt 196.1 lb

## 2016-05-07 DIAGNOSIS — R9431 Abnormal electrocardiogram [ECG] [EKG]: Secondary | ICD-10-CM

## 2016-05-07 DIAGNOSIS — Z01419 Encounter for gynecological examination (general) (routine) without abnormal findings: Secondary | ICD-10-CM | POA: Diagnosis not present

## 2016-05-07 DIAGNOSIS — Z0000 Encounter for general adult medical examination without abnormal findings: Secondary | ICD-10-CM | POA: Diagnosis not present

## 2016-05-07 DIAGNOSIS — Z124 Encounter for screening for malignant neoplasm of cervix: Secondary | ICD-10-CM

## 2016-05-07 DIAGNOSIS — E1169 Type 2 diabetes mellitus with other specified complication: Secondary | ICD-10-CM

## 2016-05-07 DIAGNOSIS — E119 Type 2 diabetes mellitus without complications: Secondary | ICD-10-CM | POA: Diagnosis not present

## 2016-05-07 DIAGNOSIS — Z91199 Patient's noncompliance with other medical treatment and regimen due to unspecified reason: Secondary | ICD-10-CM

## 2016-05-07 DIAGNOSIS — E785 Hyperlipidemia, unspecified: Secondary | ICD-10-CM | POA: Diagnosis not present

## 2016-05-07 DIAGNOSIS — I10 Essential (primary) hypertension: Secondary | ICD-10-CM | POA: Diagnosis not present

## 2016-05-07 DIAGNOSIS — E669 Obesity, unspecified: Secondary | ICD-10-CM | POA: Diagnosis not present

## 2016-05-07 DIAGNOSIS — Z1151 Encounter for screening for human papillomavirus (HPV): Secondary | ICD-10-CM | POA: Insufficient documentation

## 2016-05-07 DIAGNOSIS — Z9119 Patient's noncompliance with other medical treatment and regimen: Secondary | ICD-10-CM

## 2016-05-07 DIAGNOSIS — F17219 Nicotine dependence, cigarettes, with unspecified nicotine-induced disorders: Secondary | ICD-10-CM

## 2016-05-07 LAB — GLUCOSE, POCT (MANUAL RESULT ENTRY): POC Glucose: 189 mg/dl — AB (ref 70–99)

## 2016-05-07 NOTE — Patient Instructions (Addendum)
F/u in 8 weeks, call if you need me sooner  Blood pressure is very high, you refuse to go to the ed, ensure if you develop weakness , numbness, headache go immediately, or change in vision  Work with Dr Luan Pulling on breathing  You are referred to cardiology about uncontrolled blood pressure and heart failure   Fasting labs for next  Visit  You Can Quit Smoking If you are ready to quit smoking or are thinking about it, congratulations! You have chosen to help yourself be healthier and live longer! There are lots of different ways to quit smoking. Nicotine gum, nicotine patches, a nicotine inhaler, or nicotine nasal spray can help with physical craving. Hypnosis, support groups, and medicines help break the habit of smoking. TIPS TO GET OFF AND STAY OFF CIGARETTES  Learn to predict your moods. Do not let a bad situation be your excuse to have a cigarette. Some situations in your life might tempt you to have a cigarette.  Ask friends and co-workers not to smoke around you.  Make your home smoke-free.  Never have "just one" cigarette. It leads to wanting another and another. Remind yourself of your decision to quit.  On a card, make a list of your reasons for not smoking. Read it at least the same number of times a day as you have a cigarette. Tell yourself everyday, "I do not want to smoke. I choose not to smoke."  Ask someone at home or work to help you with your plan to quit smoking.  Have something planned after you eat or have a cup of coffee. Take a walk or get other exercise to perk you up. This will help to keep you from overeating.  Try a relaxation exercise to calm you down and decrease your stress. Remember, you may be tense and nervous the first two weeks after you quit. This will pass.  Find new activities to keep your hands busy. Play with a pen, coin, or rubber band. Doodle or draw things on paper.  Brush your teeth right after eating. This will help cut down the craving for  the taste of tobacco after meals. You can try mouthwash too.  Try gum, breath mints, or diet candy to keep something in your mouth. IF YOU SMOKE AND WANT TO QUIT:  Do not stock up on cigarettes. Never buy a carton. Wait until one pack is finished before you buy another.  Never carry cigarettes with you at work or at home.  Keep cigarettes as far away from you as possible. Leave them with someone else.  Never carry matches or a lighter with you.  Ask yourself, "Do I need this cigarette or is this just a reflex?"  Bet with someone that you can quit. Put cigarette money in a piggy bank every morning. If you smoke, you give up the money. If you do not smoke, by the end of the week, you keep the money.  Keep trying. It takes 21 days to change a habit!  Talk to your doctor about using medicines to help you quit. These include nicotine replacement gum, lozenges, or skin patches.   This information is not intended to replace advice given to you by your health care provider. Make sure you discuss any questions you have with your health care provider.   Document Released: 08/08/2009 Document Revised: 01/04/2012 Document Reviewed: 08/08/2009 Elsevier Interactive Patient Education [employer].

## 2016-05-08 ENCOUNTER — Other Ambulatory Visit: Payer: Self-pay

## 2016-05-08 ENCOUNTER — Telehealth: Payer: Self-pay | Admitting: Family Medicine

## 2016-05-08 DIAGNOSIS — Z1211 Encounter for screening for malignant neoplasm of colon: Secondary | ICD-10-CM | POA: Insufficient documentation

## 2016-05-08 DIAGNOSIS — I10 Essential (primary) hypertension: Secondary | ICD-10-CM

## 2016-05-08 DIAGNOSIS — E1169 Type 2 diabetes mellitus with other specified complication: Secondary | ICD-10-CM

## 2016-05-08 DIAGNOSIS — E669 Obesity, unspecified: Principal | ICD-10-CM

## 2016-05-08 LAB — CYTOLOGY - PAP

## 2016-05-08 NOTE — Assessment & Plan Note (Signed)
[name] is reminded of the importance of commitment to daily physical activity for 30 minutes or more, as able and the need to limit carbohydrate intake to 30 to 60 grams per meal to help with blood sugar control.   The need to take medication as prescribed, test blood sugar as directed, and to call between visits if there is a concern that blood sugar is uncontrolled is also discussed.   [name] is reminded of the importance of daily foot exam, annual eye examination, and good blood sugar, blood pressure and cholesterol control.  Diabetic Labs Latest Ref Rng 02/28/2016 09/03/2015 04/03/2015 01/22/2015 10/01/2014  HbA1c <5.7 % 8.2(H) - - 7.0(H) -  Microalbumin Not estab mg/dL - 154.8 - - -  Micro/Creat Ratio <30 mcg/mg creat - 1191(H) - - -  Chol 125 - 200 mg/dL 251(H) - - 236(H) -  HDL >=46 mg/dL 62 - - 41(L) -  Calc LDL <130 mg/dL 129 - - 144(H) -  Triglycerides <150 mg/dL 300(H) - - 255(H) -  Creatinine 0.50 - 1.05 mg/dL 1.37(H) - 1.14(H) 1.15(H) 1.3(H)  GFR >60.00 mL/min - - - - 54.70(L)   BP/Weight 05/07/2016 02/28/2016 09/03/2015 05/09/2015 04/04/2015 04/03/2015 [id number]  Systolic BP 99991111 99991111 99991111 - [id number] - 123456  Diastolic BP XX123456 123456 AB-123456789 - 102 - 90  Wt. (Lbs) 196.12 198.12 189 186 - 186 189  BMI 37.08 37.45 35.73 35.16 - 35.16 35.73   Foot/eye exam completion dates Latest Ref Rng 02/28/2016 03/26/2015  Eye Exam No Retinopathy - Retinopathy(A)  Foot exam Order - - -  Foot Form Completion - Done -   Followed by endo, uncontrolled, need to schedule and keep f/u appt. In office blood sugar checked and less than 200

## 2016-05-08 NOTE — Progress Notes (Signed)
[name]     MRN: [id number]      DOB: 09/03/1966  HPI: Patient is in for annual physical exam.c/o increased shortness of breath and poor exercise tolerance, states she "can hardly breathe" Being treated  by pulmonary currently for this. States she takes medication as prescribed, however, blood pressure is extremely elevated, has h/o drug use and this undoubtedly is contributing to the problem Advised ED visit to address severely elevated pressure , however , she refused. Denies any headache, blurred vision , or localized weakness or numbness, agreed to go to the ED should any of this develop  PE: Pleasant  female, alert and oriented x 3, iAfebrile. HEENT No facial trauma or asymetry. Sinuses non tender.  Extra occullar muscles intact, pupils equally reactive to light. External ears normal, tympanic membranes clear. Oropharynx moist, no exudate. Neck: supple, no adenopathy,JVD or thyromegaly.No bruits.  Chest: Clear to ascultation bilaterally.No crackles or wheezes. Non tender to palpation  Breast: No asymetry,no masses or lumps. No tenderness. No nipple discharge or inversion. No axillary or supraclavicular adenopathy  Cardiovascular system; Heart sounds normal,  S1 and  S2 ,no S3.  No murmur, or thrill. Apical beat not displaced Peripheral pulses normal.  Abdomen: Soft, non tender, no organomegaly or masses. No bruits. Bowel sounds normal. No guarding, tenderness or rebound.  Rectal:  Normal sphincter tone. No rectal mass. Guaiac negative stool.  GU: External genitalia normal female genitalia , normal female distribution of hair. No lesions. Urethral meatus normal in size, no  Prolapse, no lesions visibly  Present. Bladder non tender. Vagina pink and moist , with no visible lesions , discharge present . Adequate pelvic support no  cystocele or rectocele noted Cervix pink and appears healthy, no lesions or ulcerations noted, no discharge noted from  os Uterus normal size, no adnexal masses, no cervical motion or adnexal tenderness.   Musculoskeletal exam: Full ROM of spine, hips , shoulders and knees. No deformity ,swelling or crepitus noted. No muscle wasting or atrophy.   Neurologic: Cranial nerves 2 to 12 intact. Power, tone ,sensation and reflexes normal throughout. No disturbance in gait. No tremor.  Skin: Intact, no ulceration, erythema , scaling or rash noted. Pigmentation normal throughout  Psych; Normal mood and affect. Judgement and concentration normal   Assessment & Plan:  Annual physical exam Annual exam as documented. Counseling done  re healthy lifestyle involving commitment to 150 minutes exercise per week, heart healthy diet, and attaining healthy weight.The importance of adequate sleep also discussed. Regular seat belt use and home safety, is also discussed. Changes in health habits are decided on by the patient with goals and time frames  set for achieving them. Immunization and cancer screening needs are specifically addressed at this visit.   HTN (hypertension), malignant Uncontrolled, medicaly non compliant, recommended that pt go to the emergency room for blood pressure management, and she refused. No symptoms or signs of acute neurologic insult, however warned / advised that she is at  high risk of recurrent stroke and that she needs to go to the ED directly if neurologic deficits develop, she verbalized understanding EKG in office at visit showed no signs of acute ischemia, she had mild sinus tachycardia. She is being referred to cardiology for eval and management of malignant hypertension and heart failure   Medical non-compliance An ongoing challenge in patient with established vascular disease, s/p CVA, malignant hypertension, uncontrolled diabetes, nicotine use and illicit drug use , will also engae [facility] assistance  With her care  Cigarette nicotine dependence with nicotine-induced  disorder Patient counseled for approximately 5 minutes regarding the health risks of ongoing nicotine use, specifically all types of cancer, heart disease, stroke and respiratory failure. The options available for help with cessation ,the behavioral changes to assist the process, and the option to either gradully reduce usage  Or abruptly stop.is also discussed. Pt is also encouraged to set specific goals in number of cigarettes used daily, as well as to set a quit date.     Diabetes mellitus type 2 in obese [name] is reminded of the importance of commitment to daily physical activity for 30 minutes or more, as able and the need to limit carbohydrate intake to 30 to 60 grams per meal to help with blood sugar control.   The need to take medication as prescribed, test blood sugar as directed, and to call between visits if there is a concern that blood sugar is uncontrolled is also discussed.   [name] is reminded of the importance of daily foot exam, annual eye examination, and good blood sugar, blood pressure and cholesterol control.  Diabetic Labs Latest Ref Rng 02/28/2016 09/03/2015 04/03/2015 01/22/2015 10/01/2014  HbA1c <5.7 % 8.2(H) - - 7.0(H) -  Microalbumin Not estab mg/dL - 154.8 - - -  Micro/Creat Ratio <30 mcg/mg creat - 1191(H) - - -  Chol 125 - 200 mg/dL 251(H) - - 236(H) -  HDL >=46 mg/dL 62 - - 41(L) -  Calc LDL <130 mg/dL 129 - - 144(H) -  Triglycerides <150 mg/dL 300(H) - - 255(H) -  Creatinine 0.50 - 1.05 mg/dL 1.37(H) - 1.14(H) 1.15(H) 1.3(H)  GFR >60.00 mL/min - - - - 54.70(L)   BP/Weight 05/07/2016 02/28/2016 09/03/2015 05/09/2015 04/04/2015 04/03/2015 [id number]  Systolic BP 99991111 99991111 99991111 - [id number] - 123456  Diastolic BP XX123456 123456 [id number] - 102 - 90  Wt. (Lbs) 196.12 198.12 189 186 - 186 189  BMI 37.08 37.45 35.73 35.16 - 35.16 35.73   Foot/eye exam completion dates Latest Ref Rng 02/28/2016 03/26/2015  Eye Exam No Retinopathy - Retinopathy(A)  Foot exam Order - - -  Foot Form Completion -  Done -   Followed by endo, uncontrolled, need to schedule and keep f/u appt. In office blood sugar checked and less than 200

## 2016-05-08 NOTE — Addendum Note (Signed)
Addended by: Eual Fines on: 05/08/2016 10:17 AM   Modules accepted: Orders, Medications

## 2016-05-08 NOTE — Assessment & Plan Note (Signed)

## 2016-05-08 NOTE — Telephone Encounter (Signed)
All meds were collected in the past 4 weeks. Specifically benazepril, furosemide, metoprolol, spironolactone, maxzide were refilled 04/30/2016.

## 2016-05-08 NOTE — Telephone Encounter (Signed)
Thank you, nothing to add as far as medications are concerned

## 2016-05-08 NOTE — Assessment & Plan Note (Signed)
An ongoing challenge in patient with established vascular disease, s/p CVA, malignant hypertension, uncontrolled diabetes, nicotine use and illicit drug use , will also engae [facility] assistance  With her care

## 2016-05-08 NOTE — Telephone Encounter (Signed)
pls contact pt's pharmacy, and remove from her med list any medications that have not been filled in past 4 weeks.  Pls call pt, advise her that and please again refer hjer for [facility] involvement in complex chronic disease management , medication compliance due to ,malignant hypertension, if she agrees.   Pls send me a tele message with update of her actual med list once completed, so that i can know where to start with blood pressure management until she sees cardiology about this, thanks

## 2016-05-08 NOTE — Assessment & Plan Note (Addendum)
Uncontrolled, medicaly non compliant, recommended that pt go to the emergency room for blood pressure management, and she refused. No symptoms or signs of acute neurologic insult, however warned / advised that she is at  high risk of recurrent stroke and that she needs to go to the ED directly if neurologic deficits develop, she verbalized understanding EKG in office at visit showed no signs of acute ischemia, she had mild sinus tachycardia. She is being referred to cardiology for eval and management of malignant hypertension and heart failure

## 2016-05-08 NOTE — Assessment & Plan Note (Signed)

## 2016-05-10 DIAGNOSIS — J441 Chronic obstructive pulmonary disease with (acute) exacerbation: Secondary | ICD-10-CM | POA: Diagnosis not present

## 2016-05-11 ENCOUNTER — Other Ambulatory Visit: Payer: Self-pay | Admitting: Family Medicine

## 2016-05-12 DIAGNOSIS — I509 Heart failure, unspecified: Secondary | ICD-10-CM | POA: Diagnosis not present

## 2016-05-15 ENCOUNTER — Other Ambulatory Visit: Payer: Self-pay

## 2016-05-15 MED ORDER — METRONIDAZOLE 500 MG PO TABS: 500.0000 mg | ORAL_TABLET | Freq: Two times a day (BID) | ORAL | Status: DC

## 2016-05-19 ENCOUNTER — Other Ambulatory Visit: Payer: Self-pay | Admitting: *Deleted

## 2016-05-19 NOTE — Patient Outreach (Signed)
[facility]) Care Management  05/19/2016  [name] [birth year] [id number]  Md referral for consult -complex chronic disease management, medication compliance due to malignant hypertension; other dx DM, COPD/pneumonia.  Telephone call to patient who was advised of reason for call & [facility] care management services.  HIPPA verification received from patient.   Patient started coughing and was unable to continue call.  I advised that I could call back at another time. Patient agreed to set appointment. Requested call back at [phone] Rm [phone]. Plan: Will follow up at set appointment with patient.  Sherrin Daisy, RN BSN [place] Management Coordinator [facility] Care Management  [phone]

## 2016-05-20 ENCOUNTER — Ambulatory Visit: Payer: Self-pay | Admitting: *Deleted

## 2016-05-21 ENCOUNTER — Other Ambulatory Visit: Payer: Self-pay | Admitting: *Deleted

## 2016-05-21 DIAGNOSIS — I10 Essential (primary) hypertension: Secondary | ICD-10-CM

## 2016-05-21 NOTE — Patient Outreach (Signed)
[facility]) Care Management  05/21/2016  [name] 04/12/1966 [id number]  Telephone call to patient who gave HIPPA verification . Patient aware of referral of primary care physician to [facility] care management.  Patient voices that she has problems with COPD and high blood pressure.  States at last doctor's appointment blood pressure was very high and MD advised her that she needed to go to emergency department. Patient stated she refused to go because she thought she would have to be admitted to hospital.  States she does not know what  blood pressure reading was in MD office and has not had blood pressure taken since she left MD office. States she does not currently own blood pressure monitor and has not gone to use self service blood pressure monitoring in local pharmacy. (Per MD office notes recorded blood pressure was 238/140 on 7/13).     States she is currently having problems today with COPD and thinks she is catching a cold.. States she is a current every day smoker but has cut down from 2 packs daily to 1 pack. States she will see lung specialist next week which is her 2nd visit. States diagnosed with COPD 6 months ago.  Patient voices that she is taking her medications. States  medications are pre packed in bubble packs and delivered to her home.   Voices she lives alone but has assistance from aide for 7 hrs per day Monday- Friday and 3 hrs per day on Saturday and Sunday. States aide helps her with personal care if needed, household needs, and takes her to MD appointments.    Patient voices that she is tired and does not wish to talk anymore. Unable to complete further health assessments.  States she does want [facility] care management services.  Assessment: -uncontrolled HTN, not monitoring at home -poor mobility, falls risk -recent diagnosis COPD, smoking daily -other dxs: Diabetes 2, obesity, CKD 3, anxiety disorder -taking over 20 medications daily -Has aide  assistance daily  Plan: Refer to care management assistant to assign to  community care coordinator for complex case management :, refer to pharmacy for patient  taking over 20 medications. Telephonic signing off.  Sherrin Daisy, RN BSN [place] Management Coordinator [facility] Care Management  [phone]

## 2016-05-22 ENCOUNTER — Encounter: Payer: Self-pay | Admitting: *Deleted

## 2016-05-22 ENCOUNTER — Other Ambulatory Visit: Payer: Self-pay | Admitting: *Deleted

## 2016-05-22 NOTE — Patient Outreach (Signed)
Outreach call in regard to community referral. No answer, left message with both RNCM contact and main office line contact. Plan to call again to attempt outreach. Royetta Crochet. Laymond Purser, RN, BSN, [place] [phone]

## 2016-05-24 ENCOUNTER — Inpatient Hospital Stay (HOSPITAL_COMMUNITY)
Admission: EM | Admit: 2016-05-24 | Discharge: 2016-05-29 | DRG: 291 | Disposition: A | Discharge status: Home / Self Care | Payer: Medicare Other | Attending: Family Medicine | Admitting: Family Medicine

## 2016-05-24 ENCOUNTER — Encounter (HOSPITAL_COMMUNITY): Payer: Self-pay | Admitting: *Deleted

## 2016-05-24 DIAGNOSIS — R062 Wheezing: Secondary | ICD-10-CM | POA: Diagnosis not present

## 2016-05-24 DIAGNOSIS — J441 Chronic obstructive pulmonary disease with (acute) exacerbation: Secondary | ICD-10-CM | POA: Diagnosis not present

## 2016-05-24 DIAGNOSIS — N1832 Chronic kidney disease, stage 3b: Secondary | ICD-10-CM | POA: Diagnosis present

## 2016-05-24 DIAGNOSIS — Z825 Family history of asthma and other chronic lower respiratory diseases: Secondary | ICD-10-CM | POA: Diagnosis not present

## 2016-05-24 DIAGNOSIS — E7800 Pure hypercholesterolemia, unspecified: Secondary | ICD-10-CM | POA: Diagnosis not present

## 2016-05-24 DIAGNOSIS — E1169 Type 2 diabetes mellitus with other specified complication: Secondary | ICD-10-CM

## 2016-05-24 DIAGNOSIS — Z7951 Long term (current) use of inhaled steroids: Secondary | ICD-10-CM

## 2016-05-24 DIAGNOSIS — Z87891 Personal history of nicotine dependence: Secondary | ICD-10-CM | POA: Diagnosis not present

## 2016-05-24 DIAGNOSIS — E785 Hyperlipidemia, unspecified: Secondary | ICD-10-CM | POA: Diagnosis present

## 2016-05-24 DIAGNOSIS — I43 Cardiomyopathy in diseases classified elsewhere: Secondary | ICD-10-CM | POA: Diagnosis present

## 2016-05-24 DIAGNOSIS — Z79899 Other long term (current) drug therapy: Secondary | ICD-10-CM

## 2016-05-24 DIAGNOSIS — Z7902 Long term (current) use of antithrombotics/antiplatelets: Secondary | ICD-10-CM | POA: Diagnosis not present

## 2016-05-24 DIAGNOSIS — I429 Cardiomyopathy, unspecified: Secondary | ICD-10-CM | POA: Diagnosis not present

## 2016-05-24 DIAGNOSIS — I10 Essential (primary) hypertension: Secondary | ICD-10-CM | POA: Diagnosis present

## 2016-05-24 DIAGNOSIS — Z8249 Family history of ischemic heart disease and other diseases of the circulatory system: Secondary | ICD-10-CM

## 2016-05-24 DIAGNOSIS — I5043 Acute on chronic combined systolic (congestive) and diastolic (congestive) heart failure: Secondary | ICD-10-CM | POA: Diagnosis present

## 2016-05-24 DIAGNOSIS — Z7984 Long term (current) use of oral hypoglycemic drugs: Secondary | ICD-10-CM

## 2016-05-24 DIAGNOSIS — F418 Other specified anxiety disorders: Secondary | ICD-10-CM | POA: Diagnosis not present

## 2016-05-24 DIAGNOSIS — R0602 Shortness of breath: Secondary | ICD-10-CM | POA: Diagnosis not present

## 2016-05-24 DIAGNOSIS — F329 Major depressive disorder, single episode, unspecified: Secondary | ICD-10-CM | POA: Diagnosis present

## 2016-05-24 DIAGNOSIS — E669 Obesity, unspecified: Secondary | ICD-10-CM

## 2016-05-24 DIAGNOSIS — F419 Anxiety disorder, unspecified: Secondary | ICD-10-CM

## 2016-05-24 DIAGNOSIS — G894 Chronic pain syndrome: Secondary | ICD-10-CM | POA: Diagnosis present

## 2016-05-24 DIAGNOSIS — F410 Panic disorder [episodic paroxysmal anxiety] without agoraphobia: Secondary | ICD-10-CM | POA: Diagnosis present

## 2016-05-24 DIAGNOSIS — Z9119 Patient's noncompliance with other medical treatment and regimen: Secondary | ICD-10-CM | POA: Diagnosis not present

## 2016-05-24 DIAGNOSIS — I471 Supraventricular tachycardia: Secondary | ICD-10-CM | POA: Diagnosis not present

## 2016-05-24 DIAGNOSIS — N183 Chronic kidney disease, stage 3 unspecified: Secondary | ICD-10-CM | POA: Diagnosis present

## 2016-05-24 DIAGNOSIS — N179 Acute kidney failure, unspecified: Secondary | ICD-10-CM | POA: Diagnosis present

## 2016-05-24 DIAGNOSIS — E1122 Type 2 diabetes mellitus with diabetic chronic kidney disease: Secondary | ICD-10-CM | POA: Diagnosis not present

## 2016-05-24 DIAGNOSIS — I5042 Chronic combined systolic (congestive) and diastolic (congestive) heart failure: Secondary | ICD-10-CM | POA: Diagnosis present

## 2016-05-24 DIAGNOSIS — T502X5A Adverse effect of carbonic-anhydrase inhibitors, benzothiadiazides and other diuretics, initial encounter: Secondary | ICD-10-CM | POA: Diagnosis present

## 2016-05-24 DIAGNOSIS — E1165 Type 2 diabetes mellitus with hyperglycemia: Secondary | ICD-10-CM | POA: Diagnosis present

## 2016-05-24 DIAGNOSIS — F1410 Cocaine abuse, uncomplicated: Secondary | ICD-10-CM | POA: Diagnosis present

## 2016-05-24 DIAGNOSIS — Z833 Family history of diabetes mellitus: Secondary | ICD-10-CM | POA: Diagnosis not present

## 2016-05-24 DIAGNOSIS — R0682 Tachypnea, not elsewhere classified: Secondary | ICD-10-CM | POA: Diagnosis not present

## 2016-05-24 DIAGNOSIS — F322 Major depressive disorder, single episode, severe without psychotic features: Secondary | ICD-10-CM | POA: Diagnosis present

## 2016-05-24 DIAGNOSIS — F1910 Other psychoactive substance abuse, uncomplicated: Secondary | ICD-10-CM | POA: Diagnosis not present

## 2016-05-24 DIAGNOSIS — Z809 Family history of malignant neoplasm, unspecified: Secondary | ICD-10-CM | POA: Diagnosis not present

## 2016-05-24 DIAGNOSIS — I130 Hypertensive heart and chronic kidney disease with heart failure and stage 1 through stage 4 chronic kidney disease, or unspecified chronic kidney disease: Principal | ICD-10-CM | POA: Diagnosis present

## 2016-05-24 DIAGNOSIS — E119 Type 2 diabetes mellitus without complications: Secondary | ICD-10-CM

## 2016-05-24 DIAGNOSIS — J9601 Acute respiratory failure with hypoxia: Secondary | ICD-10-CM | POA: Diagnosis not present

## 2016-05-24 DIAGNOSIS — E782 Mixed hyperlipidemia: Secondary | ICD-10-CM | POA: Diagnosis present

## 2016-05-24 HISTORY — DX: Personal history of other diseases of the digestive system: Z87.19

## 2016-05-24 HISTORY — DX: Chronic obstructive pulmonary disease, unspecified: J44.9

## 2016-05-24 HISTORY — DX: Essential (primary) hypertension: I10

## 2016-05-24 LAB — CBC
HCT: 46 % (ref 36.0–46.0)
Hemoglobin: 15.1 g/dL — ABNORMAL HIGH (ref 12.0–15.0)
MCH: 32.1 pg (ref 26.0–34.0)
MCHC: 32.8 g/dL (ref 30.0–36.0)
MCV: 97.7 fL (ref 78.0–100.0)
Platelets: 219 10*3/uL (ref 150–400)
RBC: 4.71 MIL/uL (ref 3.87–5.11)
RDW: 13.7 % (ref 11.5–15.5)
WBC: 9 10*3/uL (ref 4.0–10.5)

## 2016-05-24 LAB — BASIC METABOLIC PANEL
Anion gap: 10 (ref 5–15)
BUN: 15 mg/dL (ref 6–20)
CO2: 24 mmol/L (ref 22–32)
Calcium: 10.3 mg/dL (ref 8.9–10.3)
Chloride: 106 mmol/L (ref 101–111)
Creatinine, Ser: 1.24 mg/dL — ABNORMAL HIGH (ref 0.44–1.00)
GFR calc Af Amer: 57 mL/min — ABNORMAL LOW (ref >60)
GFR calc non Af Amer: 49 mL/min — ABNORMAL LOW (ref >60)
Glucose, Bld: 200 mg/dL — ABNORMAL HIGH (ref 65–99)
Potassium: 3.8 mmol/L (ref 3.5–5.1)
Sodium: 140 mmol/L (ref 135–145)

## 2016-05-24 LAB — I-STAT CG4 LACTIC ACID, ED: Lactic Acid, Venous: 1.54 mmol/L (ref 0.5–1.9)

## 2016-05-24 MED ORDER — ALBUTEROL (5 MG/ML) CONTINUOUS INHALATION SOLN
15.0000 mg/h | INHALATION_SOLUTION | Freq: Once | RESPIRATORY_TRACT | Status: AC
  Administered 2016-05-24: 15 mg/h via RESPIRATORY_TRACT
  Filled 2016-05-24: qty 20

## 2016-05-24 MED ORDER — METHYLPREDNISOLONE SODIUM SUCC 125 MG IJ SOLR
125.0000 mg | Freq: Once | INTRAMUSCULAR | Status: AC
  Administered 2016-05-25: 125 mg via INTRAVENOUS
  Filled 2016-05-24: qty 2

## 2016-05-24 MED ORDER — ALBUTEROL SULFATE (2.5 MG/3ML) 0.083% IN NEBU
5.0000 mg | INHALATION_SOLUTION | Freq: Once | RESPIRATORY_TRACT | Status: AC
  Administered 2016-05-24: 5 mg via RESPIRATORY_TRACT
  Filled 2016-05-24: qty 6

## 2016-05-24 MED ORDER — IPRATROPIUM BROMIDE 0.02 % IN SOLN
0.5000 mg | Freq: Once | RESPIRATORY_TRACT | Status: AC
  Administered 2016-05-24: 0.5 mg via RESPIRATORY_TRACT
  Filled 2016-05-24: qty 2.5

## 2016-05-24 MED ORDER — ACETAMINOPHEN 500 MG PO TABS
1000.0000 mg | ORAL_TABLET | Freq: Once | ORAL | Status: AC
  Administered 2016-05-25: 1000 mg via ORAL
  Filled 2016-05-24: qty 2
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
## 2016-05-24 NOTE — ED Triage Notes (Signed)
Pt c/o non productive cough, sob that has gotten worse over the past week, was given one albuterol treatment with ems with little improvement in symptoms,

## 2016-05-25 ENCOUNTER — Encounter (HOSPITAL_COMMUNITY): Payer: Self-pay | Admitting: Emergency Medicine

## 2016-05-25 ENCOUNTER — Emergency Department (HOSPITAL_COMMUNITY): Payer: Medicare Other

## 2016-05-25 DIAGNOSIS — J441 Chronic obstructive pulmonary disease with (acute) exacerbation: Secondary | ICD-10-CM | POA: Diagnosis present

## 2016-05-25 DIAGNOSIS — I5042 Chronic combined systolic (congestive) and diastolic (congestive) heart failure: Secondary | ICD-10-CM | POA: Diagnosis present

## 2016-05-25 LAB — COMPREHENSIVE METABOLIC PANEL
ALT: 21 U/L (ref 14–54)
AST: 23 U/L (ref 15–41)
Albumin: 3.5 g/dL (ref 3.5–5.0)
Alkaline Phosphatase: 85 U/L (ref 38–126)
Anion gap: 9 (ref 5–15)
BUN: 17 mg/dL (ref 6–20)
CO2: 25 mmol/L (ref 22–32)
Calcium: 9.5 mg/dL (ref 8.9–10.3)
Chloride: 102 mmol/L (ref 101–111)
Creatinine, Ser: 1.4 mg/dL — ABNORMAL HIGH (ref 0.44–1.00)
GFR calc Af Amer: 49 mL/min — ABNORMAL LOW (ref >60)
GFR calc non Af Amer: 42 mL/min — ABNORMAL LOW (ref >60)
Glucose, Bld: 445 mg/dL — ABNORMAL HIGH (ref 65–99)
Potassium: 4.5 mmol/L (ref 3.5–5.1)
Sodium: 136 mmol/L (ref 135–145)
Total Bilirubin: 0.6 mg/dL (ref 0.3–1.2)
Total Protein: 6.9 g/dL (ref 6.5–8.1)

## 2016-05-25 LAB — CBC
HCT: 43.6 % (ref 36.0–46.0)
Hemoglobin: 14.1 g/dL (ref 12.0–15.0)
MCH: 32 pg (ref 26.0–34.0)
MCHC: 32.3 g/dL (ref 30.0–36.0)
MCV: 99.1 fL (ref 78.0–100.0)
Platelets: 205 10*3/uL (ref 150–400)
RBC: 4.4 MIL/uL (ref 3.87–5.11)
RDW: 14 % (ref 11.5–15.5)
WBC: 7.5 10*3/uL (ref 4.0–10.5)

## 2016-05-25 LAB — GLUCOSE, CAPILLARY
Glucose-Capillary: 381 mg/dL — ABNORMAL HIGH (ref 65–99)
Glucose-Capillary: 344 mg/dL — ABNORMAL HIGH (ref 65–99)
Glucose-Capillary: 391 mg/dL — ABNORMAL HIGH (ref 65–99)
Glucose-Capillary: 273 mg/dL — ABNORMAL HIGH (ref 65–99)

## 2016-05-25 LAB — BRAIN NATRIURETIC PEPTIDE: B Natriuretic Peptide: 667 pg/mL — ABNORMAL HIGH (ref 0.0–100.0)

## 2016-05-25 MED ORDER — IPRATROPIUM-ALBUTEROL 0.5-2.5 (3) MG/3ML IN SOLN
3.0000 mL | Freq: Four times a day (QID) | RESPIRATORY_TRACT | Status: DC
  Administered 2016-05-25 – 2016-05-29 (×15): 3 mL via RESPIRATORY_TRACT
  Filled 2016-05-25 (×15): qty 3

## 2016-05-25 MED ORDER — POTASSIUM CHLORIDE CRYS ER 20 MEQ PO TBCR
20.0000 meq | EXTENDED_RELEASE_TABLET | Freq: Every day | ORAL | Status: DC
  Administered 2016-05-25 – 2016-05-29 (×5): 20 meq via ORAL
  Filled 2016-05-25 (×5): qty 1

## 2016-05-25 MED ORDER — ROSUVASTATIN CALCIUM 20 MG PO TABS
40.0000 mg | ORAL_TABLET | Freq: Every day | ORAL | Status: DC
  Administered 2016-05-25 – 2016-05-26 (×2): 40 mg via ORAL
  Filled 2016-05-25 (×2): qty 2

## 2016-05-25 MED ORDER — SPIRONOLACTONE 25 MG PO TABS
25.0000 mg | ORAL_TABLET | Freq: Every day | ORAL | Status: DC
  Administered 2016-05-25 – 2016-05-27 (×3): 25 mg via ORAL
  Filled 2016-05-25 (×3): qty 1

## 2016-05-25 MED ORDER — ROSUVASTATIN CALCIUM 20 MG PO TABS
40.0000 mg | ORAL_TABLET | Freq: Every day | ORAL | Status: DC
  Administered 2016-05-27 – 2016-05-29 (×3): 40 mg via ORAL
  Filled 2016-05-25 (×3): qty 2

## 2016-05-25 MED ORDER — MOMETASONE FURO-FORMOTEROL FUM 100-5 MCG/ACT IN AERO
2.0000 | INHALATION_SPRAY | Freq: Two times a day (BID) | RESPIRATORY_TRACT | Status: DC
  Administered 2016-05-25 – 2016-05-29 (×8): 2 via RESPIRATORY_TRACT
  Filled 2016-05-25: qty 8.8

## 2016-05-25 MED ORDER — PANTOPRAZOLE SODIUM 40 MG PO TBEC
40.0000 mg | DELAYED_RELEASE_TABLET | Freq: Every day | ORAL | Status: DC
  Administered 2016-05-26 – 2016-05-29 (×4): 40 mg via ORAL
  Filled 2016-05-25 (×4): qty 1

## 2016-05-25 MED ORDER — INSULIN ASPART 100 UNIT/ML ~~LOC~~ SOLN
0.0000 [IU] | Freq: Three times a day (TID) | SUBCUTANEOUS | Status: DC
  Administered 2016-05-25: 7 [IU] via SUBCUTANEOUS
  Administered 2016-05-25 – 2016-05-26 (×3): 9 [IU] via SUBCUTANEOUS

## 2016-05-25 MED ORDER — IPRATROPIUM BROMIDE 0.02 % IN SOLN: 0.5000 mg | Freq: Once | RESPIRATORY_TRACT | Status: DC

## 2016-05-25 MED ORDER — ONDANSETRON HCL 4 MG/2ML IJ SOLN: 4.0000 mg | Freq: Four times a day (QID) | INTRAMUSCULAR | Status: DC | PRN

## 2016-05-25 MED ORDER — ENOXAPARIN SODIUM 40 MG/0.4ML ~~LOC~~ SOLN
40.0000 mg | SUBCUTANEOUS | Status: DC
  Administered 2016-05-25 – 2016-05-29 (×5): 40 mg via SUBCUTANEOUS
  Filled 2016-05-25 (×5): qty 0.4

## 2016-05-25 MED ORDER — GUAIFENESIN ER 600 MG PO TB12
1200.0000 mg | ORAL_TABLET | Freq: Two times a day (BID) | ORAL | Status: DC
  Administered 2016-05-25 – 2016-05-29 (×8): 1200 mg via ORAL
  Filled 2016-05-25 (×8): qty 2

## 2016-05-25 MED ORDER — GABAPENTIN 100 MG PO CAPS
100.0000 mg | ORAL_CAPSULE | Freq: Three times a day (TID) | ORAL | Status: DC
  Administered 2016-05-25 – 2016-05-29 (×12): 100 mg via ORAL
  Filled 2016-05-25 (×12): qty 1

## 2016-05-25 MED ORDER — HYDRALAZINE HCL 20 MG/ML IJ SOLN
10.0000 mg | Freq: Four times a day (QID) | INTRAMUSCULAR | Status: DC | PRN
  Administered 2016-05-26: 10 mg via INTRAVENOUS
  Filled 2016-05-25 (×2): qty 1

## 2016-05-25 MED ORDER — CETYLPYRIDINIUM CHLORIDE 0.05 % MT LIQD
7.0000 mL | Freq: Two times a day (BID) | OROMUCOSAL | Status: DC
  Administered 2016-05-25 – 2016-05-29 (×8): 7 mL via OROMUCOSAL

## 2016-05-25 MED ORDER — FLUOXETINE HCL 20 MG PO CAPS
20.0000 mg | ORAL_CAPSULE | Freq: Every day | ORAL | Status: DC
  Administered 2016-05-25 – 2016-05-29 (×5): 20 mg via ORAL
  Filled 2016-05-25 (×5): qty 1

## 2016-05-25 MED ORDER — IPRATROPIUM-ALBUTEROL 0.5-2.5 (3) MG/3ML IN SOLN
3.0000 mL | Freq: Once | RESPIRATORY_TRACT | Status: AC
  Administered 2016-05-25: 3 mL via RESPIRATORY_TRACT
  Filled 2016-05-25: qty 3

## 2016-05-25 MED ORDER — TRAZODONE HCL 50 MG PO TABS
50.0000 mg | ORAL_TABLET | Freq: Every day | ORAL | Status: DC
  Administered 2016-05-25 – 2016-05-28 (×4): 50 mg via ORAL
  Filled 2016-05-25 (×5): qty 1

## 2016-05-25 MED ORDER — ALPRAZOLAM 0.5 MG PO TABS
0.5000 mg | ORAL_TABLET | Freq: Three times a day (TID) | ORAL | Status: DC | PRN
  Administered 2016-05-25 – 2016-05-29 (×11): 0.5 mg via ORAL
  Filled 2016-05-25 (×11): qty 1

## 2016-05-25 MED ORDER — METOPROLOL TARTRATE 25 MG PO TABS
25.0000 mg | ORAL_TABLET | Freq: Two times a day (BID) | ORAL | Status: DC
  Administered 2016-05-25 – 2016-05-27 (×5): 25 mg via ORAL
  Filled 2016-05-25 (×5): qty 1

## 2016-05-25 MED ORDER — IPRATROPIUM-ALBUTEROL 0.5-2.5 (3) MG/3ML IN SOLN
3.0000 mL | Freq: Four times a day (QID) | RESPIRATORY_TRACT | Status: DC | PRN
  Administered 2016-05-25: 3 mL via RESPIRATORY_TRACT
  Filled 2016-05-25: qty 3

## 2016-05-25 MED ORDER — FUROSEMIDE 10 MG/ML IJ SOLN
40.0000 mg | Freq: Two times a day (BID) | INTRAMUSCULAR | Status: DC
  Administered 2016-05-25 – 2016-05-27 (×4): 40 mg via INTRAVENOUS
  Filled 2016-05-25 (×4): qty 4

## 2016-05-25 MED ORDER — TIZANIDINE HCL 4 MG PO TABS
2.0000 mg | ORAL_TABLET | Freq: Two times a day (BID) | ORAL | Status: DC
  Administered 2016-05-25 – 2016-05-29 (×8): 2 mg via ORAL
  Filled 2016-05-25 (×8): qty 1

## 2016-05-25 MED ORDER — ALBUTEROL SULFATE (2.5 MG/3ML) 0.083% IN NEBU: 2.5000 mg | INHALATION_SOLUTION | RESPIRATORY_TRACT | Status: DC | PRN

## 2016-05-25 MED ORDER — FLUTICASONE PROPIONATE 50 MCG/ACT NA SUSP
2.0000 | Freq: Every day | NASAL | Status: DC
  Administered 2016-05-25 – 2016-05-29 (×5): 2 via NASAL
  Filled 2016-05-25 (×3): qty 16

## 2016-05-25 MED ORDER — ALBUTEROL SULFATE (2.5 MG/3ML) 0.083% IN NEBU: 5.0000 mg | INHALATION_SOLUTION | Freq: Once | RESPIRATORY_TRACT | Status: DC

## 2016-05-25 MED ORDER — ONDANSETRON HCL 4 MG PO TABS: 4.0000 mg | ORAL_TABLET | Freq: Four times a day (QID) | ORAL | Status: DC | PRN

## 2016-05-25 MED ORDER — ALBUTEROL SULFATE (2.5 MG/3ML) 0.083% IN NEBU: 5.0000 mg | INHALATION_SOLUTION | RESPIRATORY_TRACT | Status: DC | PRN

## 2016-05-25 MED ORDER — SODIUM CHLORIDE 0.9% FLUSH
3.0000 mL | Freq: Two times a day (BID) | INTRAVENOUS | Status: DC
  Administered 2016-05-25 – 2016-05-28 (×8): 3 mL via INTRAVENOUS

## 2016-05-25 MED ORDER — OXYCODONE HCL 5 MG PO TABS
5.0000 mg | ORAL_TABLET | Freq: Four times a day (QID) | ORAL | Status: DC | PRN
  Administered 2016-05-25 – 2016-05-29 (×13): 5 mg via ORAL
  Filled 2016-05-25 (×13): qty 1

## 2016-05-25 MED ORDER — MONTELUKAST SODIUM 10 MG PO TABS
10.0000 mg | ORAL_TABLET | Freq: Every day | ORAL | Status: DC
  Administered 2016-05-25 – 2016-05-28 (×4): 10 mg via ORAL
  Filled 2016-05-25 (×4): qty 1

## 2016-05-25 MED ORDER — BENAZEPRIL HCL 10 MG PO TABS
40.0000 mg | ORAL_TABLET | Freq: Every day | ORAL | Status: DC
  Administered 2016-05-25 – 2016-05-29 (×5): 40 mg via ORAL
  Filled 2016-05-25 (×5): qty 4

## 2016-05-25 MED ORDER — CLOPIDOGREL BISULFATE 75 MG PO TABS
75.0000 mg | ORAL_TABLET | Freq: Every day | ORAL | Status: DC
  Administered 2016-05-25 – 2016-05-29 (×5): 75 mg via ORAL
  Filled 2016-05-25 (×5): qty 1

## 2016-05-25 MED ORDER — HYDRALAZINE HCL 25 MG PO TABS
ORAL_TABLET | ORAL | Status: AC
  Filled 2016-05-25: qty 1

## 2016-05-25 MED ORDER — METHYLPREDNISOLONE SODIUM SUCC 125 MG IJ SOLR
60.0000 mg | Freq: Four times a day (QID) | INTRAMUSCULAR | Status: DC
  Administered 2016-05-25 – 2016-05-26 (×4): 60 mg via INTRAVENOUS
  Filled 2016-05-25 (×4): qty 2

## 2016-05-25 MED ORDER — ALBUTEROL SULFATE (2.5 MG/3ML) 0.083% IN NEBU
2.5000 mg | INHALATION_SOLUTION | Freq: Once | RESPIRATORY_TRACT | Status: AC
Start: 2016-05-25 — End: 2016-05-25
  Administered 2016-05-25: 2.5 mg via RESPIRATORY_TRACT
  Filled 2016-05-25: qty 3

## 2016-05-25 MED ORDER — SODIUM CHLORIDE 0.9% FLUSH
3.0000 mL | INTRAVENOUS | Status: DC | PRN
  Administered 2016-05-29: 3 mL via INTRAVENOUS
  Filled 2016-05-25: qty 3

## 2016-05-25 MED ORDER — SODIUM CHLORIDE 0.9 % IV SOLN: 250.0000 mL | INTRAVENOUS | Status: DC | PRN

## 2016-05-25 MED ORDER — HYDRALAZINE HCL 25 MG PO TABS
25.0000 mg | ORAL_TABLET | Freq: Four times a day (QID) | ORAL | Status: DC | PRN
  Administered 2016-05-25: 25 mg via ORAL

## 2016-05-25 MED ORDER — VARENICLINE TARTRATE 1 MG PO TABS
0.5000 mg | ORAL_TABLET | Freq: Two times a day (BID) | ORAL | Status: DC
  Administered 2016-05-25 – 2016-05-29 (×8): 0.5 mg via ORAL
  Filled 2016-05-25 (×15): qty 1

## 2016-05-25 MED ORDER — AZELASTINE HCL 0.1 % NA SOLN
2.0000 | Freq: Two times a day (BID) | NASAL | Status: DC
  Administered 2016-05-25 – 2016-05-29 (×7): 2 via NASAL
  Filled 2016-05-25: qty 30

## 2016-05-25 NOTE — Progress Notes (Signed)
Patient admitted to the hospital earlier this morning by Dr. Darrick Meigs  Patient seen and examined. She appears anxious and says that she can't breath. Lungs are clear on exam without significant wheezing and fair air movement bilaterally. No pedal edema  She has been admitted to the hospital with shortness of breath which was felt to be related to COPD exacerbation and possible CHF. She does have an elevated BNP and has been started on lasix. Will recheck Echo. Continue current treatments.  [name]

## 2016-05-25 NOTE — H&P (Signed)
TRH H&P   Patient Demographics:    [name], is a 50 y.o. female  MRN: [id number]  DOB - 04-09-1966  Admit Date - 05/24/2016  Outpatient Primary MD for the patient is Tula Nakayama, MD  Referring MD/NP/PA: Dr. Tomi Bamberger  Patient coming from: home   Chief Complaint  Patient presents with  . Shortness of Breath      HPI:    [name]  is a 50 y.o. female, With history of CHF, COPD, hypertension came to the ED with worsening shortness of breath. She denies chest pain, no nausea or vomiting. In the ED patient was given Solu-Medrol, started DuoNeb nebulizers. Also found to have uncontrolled hypertension.    Review of systems:    In addition to the HPI above,  No Fever-chills, + Headache, No changes with Vision or hearing, No problems swallowing food or Liquids, No Abdominal pain, No Nausea or Vomiting, bowel movements are regular, No Blood in stool or Urine, No dysuria, No new skin rashes or bruises, No new joints pains-aches,  No new weakness, tingling, numbness in any extremity, No recent weight gain or loss, No polyuria, polydypsia or polyphagia, No significant Mental Stressors.  A full 10 point Review of Systems was done, except as stated above, all other Review of Systems were negative.   With Past History of the following :    Past Medical History:  Diagnosis Date  . Alcohol use ([place])    Now abstinent.  . Ankle fracture, lateral malleolus, closed 12/30/2011  . Anxiety   . ANXIETY 04/17/2010   Qualifier: Diagnosis of  By: Claybon Jabs PA, Dawn    . Breast mass, left 12/15/2011  . CKD (chronic kidney disease)   . COPD (chronic obstructive pulmonary disease) ([place])   . Diabetes mellitus, type 2 ([place])   . Diabetic Charcot foot ([place]) 12/30/2011  . Diastolic dysfunction [id number]   Grade 1.  . Hypercholesteremia   . Hyperlipidemia   . Hypertension   . Hypertensive cardiomyopathy  ([place]) 11/08/2012   Ejection fraction 40-45%.  . Noncompliance with medication regimen   . Obesity   . Panic attacks   . RECTAL BLEEDING, HX OF 12/05/2009   Qualifier: Diagnosis of  By: Zeb Comfort    . Tobacco abuse       Past Surgical History:  Procedure Laterality Date  . BREAST BIOPSY    . DILATION AND CURETTAGE OF UTERUS        Social History:     Social History  Substance Use Topics  . Smoking status: Former Smoker    Packs/day: 1.00    Years: 30.00    Types: Cigarettes  . Smokeless tobacco: Never Used  . Alcohol use 0.0 oz/week     Comment: wine and mickeys         Family History :     Family History  Problem Relation Age of Onset  . Hypertension Mother   . Heart attack Mother   . Hypertension Father     CABG   .  Hypertension Sister   . Hypertension Brother   . Hypertension Sister   . Cancer Sister     breast   . Arthritis    . Cancer    . Diabetes    . Asthma        Home Medications:   Prior to Admission medications   Medication Sig Start Date End Date Taking? Authorizing Provider  albuterol (PROAIR HFA) 108 (90 Base) MCG/ACT inhaler INHALE 2 PUFFS EVERY 6 HOURS AS NEEDED FOR SHORTNESS OF BREATH/WHEEZING. 02/24/16   [name], MD  albuterol (PROVENTIL HFA;VENTOLIN HFA) 108 (90 Base) MCG/ACT inhaler Inhale 2 puffs into the lungs every 6 (six) hours as needed for wheezing or shortness of breath. 02/24/16   [name], MD  azelastine (ASTELIN) 0.1 % nasal spray USE 2 SPRAYS IN EACH NOSTRIL TWICE DAILY. 04/13/16   [name], MD  benazepril (LOTENSIN) 40 MG tablet TAKE 1 TABLET BY MOUTH ONCE A DAY. 03/25/16   [name], MD  budesonide-formoterol (SYMBICORT) 80-4.5 MCG/ACT inhaler INHALE 2 PUFFS TWICE DAILY. 02/24/16   [name], MD  Choline Fenofibrate 135 MG capsule Take 1 capsule (135 mg total) by mouth daily. 03/10/16   [name], MD  clopidogrel (PLAVIX) 75 MG tablet TAKE 1 TABLET BY MOUTH DAILY WITH  BREAKFAST. 03/25/16   [name], MD  FLUoxetine (PROZAC) 20 MG capsule TAKE 1 CAPSULE BY MOUTH EVERY DAY. 03/25/16   [name], MD  fluticasone (FLONASE) 50 MCG/ACT nasal spray USE 2 SPRAYS IN EACH NOSTRIL DAILY. 12/03/15   [name], MD  furosemide (LASIX) 40 MG tablet TAKE 1 TABLET BY MOUTH ONCE A DAY. 03/25/16   [name], MD  gabapentin (NEURONTIN) 100 MG capsule TAKE 1 CAPSULE BY MOUTH THREE TIMES A DAY. 03/25/16   [name], MD  hydrOXYzine (ATARAX/VISTARIL) 50 MG tablet TAKE (1) TABLET BY MOUTH AT BEDTIME. 03/03/16   [name], MD  ipratropium-albuterol (DUONEB) 0.5-2.5 (3) MG/3ML SOLN Take 3 mLs by nebulization every 6 (six) hours as needed. 03/10/16   [name], MD  metoprolol tartrate (LOPRESSOR) 25 MG tablet TAKE 1/2 TABLET (12.5MG ) BY MOUTH TWICE DAILY. 03/03/16   [name], MD  metroNIDAZOLE (FLAGYL) 500 MG tablet Take 1 tablet (500 mg total) by mouth 2 (two) times daily. 05/15/16   [name], MD  montelukast (SINGULAIR) 10 MG tablet TAKE (1) TABLET BY MOUTH AT BEDTIME. 03/25/16   [name], MD  pantoprazole (PROTONIX) 40 MG tablet TAKE 1 TABLET BY MOUTH ONCE A DAY. Patient not taking: Reported on 09/24/2015 09/17/15   [name], MD  potassium chloride SA (K-DUR,KLOR-CON) 20 MEQ tablet TAKE 1&1/2 TABLET BY MOUTH DAILY. 03/25/16   [name], MD  predniSONE (STERAPRED UNI-PAK 21 TAB) 5 MG (21) TBPK tablet Take 1 tablet (5 mg total) by mouth as directed. Use as directed 02/24/16   [name], MD  PROAIR HFA 108 [phone] Base) MCG/ACT inhaler INHALE 2 PUFFS EVERY 6 HOURS AS NEEDED FOR SHORTNESS OF BREATH/WHEEZING. 05/05/16   [name], MD  repaglinide (PRANDIN) 1 MG tablet Take 1 tablet (1 mg total) by mouth 3 (three) times daily before meals. 03/09/16   [name], MD  rosuvastatin (CRESTOR) 40 MG tablet TAKE 1 TABLET BY MOUTH ONCE A DAY. 03/03/16   [name], MD    rosuvastatin (CRESTOR) 40 MG tablet Take 1 tablet (40 mg total) by  mouth daily. 03/01/16   [name], MD  spironolactone (ALDACTONE) 25 MG tablet TAKE 1 TABLET BY MOUTH ONCE A DAY. Patient not taking: Reported on 09/24/2015 09/17/15   [name], MD  tizanidine (ZANAFLEX) 2 MG capsule Take 1 capsule (2 mg total) by mouth 2 (two) times daily. Patient not taking: Reported on 09/24/2015 01/30/15   [name], MD  TRADJENTA 5 MG TABS tablet TAKE 1 TABLET BY MOUTH ONCE A DAY. 03/25/16   [name], MD  traMADol (ULTRAM) 50 MG tablet TAKE 1 TABLET BY MOUTH TWICE A DAY. 03/25/16   [name], MD  traZODone (DESYREL) 50 MG tablet TAKE (1) TABLET BY MOUTH AT BEDTIME. 03/25/16   [name], MD  triamterene-hydrochlorothiazide (MAXZIDE-25) 37.5-25 MG tablet TAKE 1 TABLET BY MOUTH ONCE A DAY. 03/25/16   [name], MD  Vitamin D, Ergocalciferol, (DRISDOL) 50000 units CAPS capsule TAKE 1 CAPSULE BY MOUTH ONCE A WEEK. 03/25/16   [name], MD  ZETIA 10 MG tablet TAKE 1 TABLET BY MOUTH ONCE A DAY. 03/25/16   [name], MD     Allergies:    No Known Allergies   Physical Exam:   Vitals  Blood pressure (!) 157/116, pulse 108, temperature 98.3 F (36.8 C), temperature source Oral, resp. rate 17, height 5\' 1"  (1.549 m), weight 84.4 kg (186 lb), last menstrual period 04/24/2016, SpO2 97 %.   1. General African-American female* lying in bed in NAD, cooperative with exam  2. Normal affect and insight, Awake Alert, Oriented X 3.  3. No F.N deficits, ALL C.Nerves Intact, Strength 5/5 all 4 extremities, Sensation intact all 4 extremities, Plantars down going.  4. Ears and Eyes appear Normal, Conjunctivae clear, PERRLA. Moist Oral Mucosa.  5. Supple Neck, No JVD, No cervical lymphadenopathy appriciated, No Carotid Bruits.  6. Symmetrical Chest wall movement, bibasilar crackles  7. RRR, No Gallops, Rubs or Murmurs, No Parasternal Heave.No  Leg edema  8. Positive Bowel Sounds, Abdomen Soft, No tenderness, No organomegaly appriciated,No rebound -guarding or rigidity.  9.  No Cyanosis, Normal Skin Turgor, No Skin Rash or Bruise.  10. Good muscle tone,  joints appear normal , no effusions, Normal ROM.      Data Review:    CBC  Recent Labs Lab 05/24/16 2251  WBC 9.0  HGB 15.1*  HCT 46.0  PLT 219  MCV 97.7  MCH 32.1  MCHC 32.8  RDW 13.7   ------------------------------------------------------------------------------------------------------------------  Chemistries   Recent Labs Lab 05/24/16 2251  NA 140  K 3.8  CL 106  CO2 24  GLUCOSE 200*  BUN 15  CREATININE 1.24*  CALCIUM 10.3   ------------------------------------------------------------------------------------------------------------------  ------------------------------------------------------------------------------------------------------------------ GFR: Estimated Creatinine Clearance: 52.3 mL/min (by C-G formula based on SCr of 1.24 mg/dL).   --------------------------------------------------------------------------------------------------------------- Urine analysis:    Component Value Date/Time   COLORURINE YELLOW 09/11/2014 1319   APPEARANCEUR CLEAR 09/11/2014 1319   LABSPEC 1.015 09/11/2014 1319   PHURINE 5.5 09/11/2014 1319   GLUCOSEU NEGATIVE 09/11/2014 1319   HGBUR SMALL (A) 09/11/2014 1319   BILIRUBINUR NEGATIVE 09/11/2014 1319   KETONESUR NEGATIVE 09/11/2014 1319   PROTEINUR NEGATIVE 09/11/2014 1319   UROBILINOGEN 0.2 09/11/2014 1319   NITRITE NEGATIVE 09/11/2014 1319   LEUKOCYTESUR TRACE (A) 09/11/2014 1319      ----------------------------------------------------------------------------------------------------------------   Imaging Results:    Dg Chest Port 1 View  Result Date: 05/25/2016 CLINICAL DATA:  50 year old female with shortness of breath EXAM: PORTABLE CHEST  1 VIEW COMPARISON:  Chest radiograph dated  04/03/2015 FINDINGS: Shallow inspiration with bibasilar subsegmental atelectatic changes. Pneumonia is less likely. Clinical correlation is recommended. No focal consolidation, pleural effusion, or pneumothorax. Mild cardiomegaly. No acute osseous pathology. IMPRESSION: No acute cardiopulmonary process. Mild cardiomegaly. Electronically Signed   By: Anner Crete M.D.   On: 05/25/2016 00:26   My personal review of EKG: Normal sinus rhythm   Assessment & Plan:    Active Problems:   COPD exacerbation (HCC)   Acute exacerbation of chronic obstructive pulmonary disease (COPD) ([place])   1. COPD exacerbation- start Solu-Medrol 60 mg IV every 6 hours, DuoNeb nebulizers every 6 hours. 2. Acute on chronic diastolic heart failure- will start patient on IV Lasix 40 mg every 12 hours, follow BMP in a.m. 3. Hypertension, uncontrolled- increase the dose of metoprolol 25 mg twice a day, continue with Lotensin 40 mg daily, Aldactone 25 mg daily. 4. Diabetes mellitus- start sliding scale insulin NovoLog   DVT Prophylaxis-   Lovenox   AM Labs Ordered, also please review Full Orders  Family Communication: No family at bedside.  Code Status:  Full code  Admission status: Observation  Time spent in minutes : 60 min   [name] S M.D on 05/25/2016 at 3:17 AM  Between 7am to 7pm - Pager - [phone]. After 7pm go to [email or web address] - password [facility]  [facility] - Office  [phone]

## 2016-05-25 NOTE — ED Notes (Signed)
Pt ambulatory to restroom, c/o sob when getting back to bed, pulse ox 91% on RA, once pt became settled on bed, pulse ox increased to 98% RA, Dr Tomi Bamberger notified

## 2016-05-25 NOTE — Progress Notes (Signed)
Inpatient Diabetes Program Recommendations  AACE/ADA: New Consensus Statement on Inpatient Glycemic Control (2015)  Target Ranges:  Prepandial:   less than 140 mg/dL      Peak postprandial:   less than 180 mg/dL (1-2 hours)      Critically ill patients:  140 - 180 mg/dL   Results for [name], [name] (MRN [id number]) as of 05/25/2016 09:57  Ref. Range 05/25/2016 07:51  Glucose-Capillary Latest Ref Range: 65 - 99 mg/dL 381 (H)  Results for [name], [name] (MRN [id number]) as of 05/25/2016 09:57  Ref. Range 05/24/2016 22:51 05/25/2016 06:25  Glucose Latest Ref Range: 65 - 99 mg/dL 200 (H) 445 (H)   Review of Glycemic Control  Diabetes history: DM2 Outpatient Diabetes medications: Tradjenta 5 mg daily Current orders for Inpatient glycemic control: Novolog 0-9 units TID with meals  Inpatient Diabetes Program Recommendations: Insulin - Basal: If steroids are continued as ordered, please consider ordering Lantus 8 units Q24H. Please note that if Lantus is ordered as requested, it will need to be adjusted as steroids are tapered. Correction (SSI): Please consider increasing Novolog to moderate scale and adding Novolog bedtime correction scale.  Thanks, Barnie Alderman, RN, MSN, CDE Diabetes Coordinator Inpatient Diabetes Program [phone] (Team Pager from [place] to [place]) [phone] (AP office) [phone] [facility] office) [phone] [facility] office)

## 2016-05-25 NOTE — Care Management Note (Signed)
Case Management Note  Patient Details  Name: [name] MRN: [id number] Date of Birth: [birth year]  Subjective/Objective: Patient adm from home with CHF. Has an aide with her everyday, M-F 7 hours a day, Sat/Sun 3 hours a day. She is unsure of company. She has a PCP and insurance, reports no issues.                    Action/Plan: Anticipate DC home. May need O2 assessment prior to discharge.    Expected Discharge Date:  05/26/16               Expected Discharge Plan:  Home/Self Care  In-House Referral:  Clinical Social Work  Discharge planning Services  CM Consult  Post Acute Care Choice:  NA Choice offered to:  NA  DME Arranged:    DME Agency:     HH Arranged:    HH Agency:     Status of Service:  Completed, signed off  If discussed at [employer], dates discussed:    Additional Comments:  Lavena Loretto, Chauncey Reading, RN 05/25/2016, 2:28 PM

## 2016-05-25 NOTE — Care Management Obs Status (Signed)
[place] NOTIFICATION   Patient Details  Name: KYILA NAPORA MRN: [id number] Date of Birth: 1966-07-04   Medicare Observation Status Notification Given:  Yes    Summit Borchardt, Chauncey Reading, RN 05/25/2016, 2:30 PM

## 2016-05-26 ENCOUNTER — Observation Stay (HOSPITAL_BASED_OUTPATIENT_CLINIC_OR_DEPARTMENT_OTHER): Payer: Medicare Other

## 2016-05-26 DIAGNOSIS — G894 Chronic pain syndrome: Secondary | ICD-10-CM | POA: Diagnosis not present

## 2016-05-26 DIAGNOSIS — E785 Hyperlipidemia, unspecified: Secondary | ICD-10-CM | POA: Diagnosis present

## 2016-05-26 DIAGNOSIS — E7800 Pure hypercholesterolemia, unspecified: Secondary | ICD-10-CM | POA: Diagnosis present

## 2016-05-26 DIAGNOSIS — N183 Chronic kidney disease, stage 3 (moderate): Secondary | ICD-10-CM | POA: Diagnosis not present

## 2016-05-26 DIAGNOSIS — T502X5A Adverse effect of carbonic-anhydrase inhibitors, benzothiadiazides and other diuretics, initial encounter: Secondary | ICD-10-CM | POA: Diagnosis present

## 2016-05-26 DIAGNOSIS — I471 Supraventricular tachycardia: Secondary | ICD-10-CM | POA: Diagnosis present

## 2016-05-26 DIAGNOSIS — J441 Chronic obstructive pulmonary disease with (acute) exacerbation: Secondary | ICD-10-CM | POA: Diagnosis not present

## 2016-05-26 DIAGNOSIS — E119 Type 2 diabetes mellitus without complications: Secondary | ICD-10-CM

## 2016-05-26 DIAGNOSIS — F329 Major depressive disorder, single episode, unspecified: Secondary | ICD-10-CM | POA: Diagnosis present

## 2016-05-26 DIAGNOSIS — I429 Cardiomyopathy, unspecified: Secondary | ICD-10-CM | POA: Diagnosis not present

## 2016-05-26 DIAGNOSIS — Z825 Family history of asthma and other chronic lower respiratory diseases: Secondary | ICD-10-CM | POA: Diagnosis not present

## 2016-05-26 DIAGNOSIS — I5043 Acute on chronic combined systolic (congestive) and diastolic (congestive) heart failure: Secondary | ICD-10-CM | POA: Diagnosis not present

## 2016-05-26 DIAGNOSIS — Z9119 Patient's noncompliance with other medical treatment and regimen: Secondary | ICD-10-CM | POA: Diagnosis not present

## 2016-05-26 DIAGNOSIS — I43 Cardiomyopathy in diseases classified elsewhere: Secondary | ICD-10-CM | POA: Diagnosis present

## 2016-05-26 DIAGNOSIS — E1122 Type 2 diabetes mellitus with diabetic chronic kidney disease: Secondary | ICD-10-CM | POA: Diagnosis present

## 2016-05-26 DIAGNOSIS — F410 Panic disorder [episodic paroxysmal anxiety] without agoraphobia: Secondary | ICD-10-CM | POA: Diagnosis present

## 2016-05-26 DIAGNOSIS — Z809 Family history of malignant neoplasm, unspecified: Secondary | ICD-10-CM | POA: Diagnosis not present

## 2016-05-26 DIAGNOSIS — I509 Heart failure, unspecified: Secondary | ICD-10-CM | POA: Diagnosis not present

## 2016-05-26 DIAGNOSIS — J9601 Acute respiratory failure with hypoxia: Secondary | ICD-10-CM | POA: Diagnosis present

## 2016-05-26 DIAGNOSIS — F1410 Cocaine abuse, uncomplicated: Secondary | ICD-10-CM | POA: Diagnosis present

## 2016-05-26 DIAGNOSIS — N179 Acute kidney failure, unspecified: Secondary | ICD-10-CM | POA: Diagnosis present

## 2016-05-26 DIAGNOSIS — I130 Hypertensive heart and chronic kidney disease with heart failure and stage 1 through stage 4 chronic kidney disease, or unspecified chronic kidney disease: Secondary | ICD-10-CM | POA: Diagnosis present

## 2016-05-26 DIAGNOSIS — F418 Other specified anxiety disorders: Secondary | ICD-10-CM | POA: Diagnosis not present

## 2016-05-26 DIAGNOSIS — Z8249 Family history of ischemic heart disease and other diseases of the circulatory system: Secondary | ICD-10-CM | POA: Diagnosis not present

## 2016-05-26 DIAGNOSIS — Z833 Family history of diabetes mellitus: Secondary | ICD-10-CM | POA: Diagnosis not present

## 2016-05-26 DIAGNOSIS — E669 Obesity, unspecified: Secondary | ICD-10-CM

## 2016-05-26 DIAGNOSIS — E1165 Type 2 diabetes mellitus with hyperglycemia: Secondary | ICD-10-CM | POA: Diagnosis present

## 2016-05-26 DIAGNOSIS — F1910 Other psychoactive substance abuse, uncomplicated: Secondary | ICD-10-CM | POA: Diagnosis not present

## 2016-05-26 DIAGNOSIS — R0602 Shortness of breath: Secondary | ICD-10-CM | POA: Diagnosis present

## 2016-05-26 DIAGNOSIS — Z7902 Long term (current) use of antithrombotics/antiplatelets: Secondary | ICD-10-CM | POA: Diagnosis not present

## 2016-05-26 LAB — GLUCOSE, CAPILLARY
Glucose-Capillary: 300 mg/dL — ABNORMAL HIGH (ref 65–99)
Glucose-Capillary: 391 mg/dL — ABNORMAL HIGH (ref 65–99)
Glucose-Capillary: 426 mg/dL — ABNORMAL HIGH (ref 65–99)
Glucose-Capillary: 84 mg/dL (ref 65–99)
Glucose-Capillary: 165 mg/dL — ABNORMAL HIGH (ref 65–99)

## 2016-05-26 LAB — ECHOCARDIOGRAM COMPLETE
E decel time: 190 msec
E/e' ratio: 22
FS: 31 % (ref 28–44)
Height: 61 in
IVS/LV PW RATIO, ED: 0.94
LA ID, A-P, ES: 42 mm
LA diam end sys: 42 mm
LA diam index: 2.16 cm/m2
LA vol A4C: 82.1 ml
LA vol index: 42.2 mL/m2
LA vol: 82.1 mL
LV E/e' medial: 22
LV E/e'average: 22
LV PW d: 15 mm — AB (ref 0.6–1.1)
LV dias vol index: 44 mL/m2
LV dias vol: 87 mL (ref 46–106)
LV e' LATERAL: 5 cm/s
LV sys vol index: 19 mL/m2
LV sys vol: 38 mL (ref 14–42)
LVOT SV: 29 mL
LVOT VTI: 11.6 cm
LVOT area: 2.54 cm2
LVOT diameter: 18 mm
LVOT peak grad rest: 2 mmHg
LVOT peak vel: 69.5 cm/s
Lateral S' vel: 8.59 cm/s
MV Dec: 190
MV Peak grad: 5 mmHg
MV pk A vel: 29.6 m/s
MV pk E vel: 110 m/s
PISA EROA: 0.05 cm2
RV sys press: 30 mmHg
Reg peak vel: 262 cm/s
Simpson's disk: 57
Stroke v: 49 ml
TAPSE: 18.3 mm
TDI e' lateral: 5
TDI e' medial: 4.79
TR max vel: 262 cm/s
VTI: 176 cm
Weight: 2976 oz

## 2016-05-26 LAB — HEMOGLOBIN A1C
Hgb A1c MFr Bld: 8.6 % — ABNORMAL HIGH (ref 4.8–5.6)
Mean Plasma Glucose: 200 mg/dL

## 2016-05-26 MED ORDER — INSULIN ASPART 100 UNIT/ML ~~LOC~~ SOLN
25.0000 [IU] | Freq: Once | SUBCUTANEOUS | Status: AC
  Administered 2016-05-26: 25 [IU] via SUBCUTANEOUS

## 2016-05-26 MED ORDER — PREDNISONE 20 MG PO TABS
40.0000 mg | ORAL_TABLET | Freq: Every day | ORAL | Status: DC
  Administered 2016-05-27 – 2016-05-28 (×2): 40 mg via ORAL
  Filled 2016-05-26 (×2): qty 2

## 2016-05-26 MED ORDER — INSULIN ASPART 100 UNIT/ML ~~LOC~~ SOLN
0.0000 [IU] | Freq: Every day | SUBCUTANEOUS | Status: DC
  Administered 2016-05-27 – 2016-05-28 (×2): 3 [IU] via SUBCUTANEOUS

## 2016-05-26 MED ORDER — INSULIN ASPART 100 UNIT/ML ~~LOC~~ SOLN
0.0000 [IU] | Freq: Three times a day (TID) | SUBCUTANEOUS | Status: DC
  Administered 2016-05-27 (×2): 15 [IU] via SUBCUTANEOUS
  Administered 2016-05-27: 11 [IU] via SUBCUTANEOUS
  Administered 2016-05-28: 4 [IU] via SUBCUTANEOUS
  Administered 2016-05-28: 20 [IU] via SUBCUTANEOUS
  Administered 2016-05-29 (×2): 4 [IU] via SUBCUTANEOUS

## 2016-05-26 NOTE — Care Management Note (Signed)
Case Management Note  Patient Details  Name: DERRIANA JINKERSON MRN: [id number] Date of Birth: 1966-08-27   Status of Service:  Completed, signed off  If discussed at Long Length of Stay Meetings, dates discussed:    Additional Comments: Patient will need oxygen at home. Romualdo Bolk of [facility] notified and will obtain orders from chart.   Ave Scharnhorst, Chauncey Reading, RN 05/26/2016, 2:09 PM

## 2016-05-26 NOTE — Progress Notes (Signed)
Inpatient Diabetes Program Recommendations  AACE/ADA: New Consensus Statement on Inpatient Glycemic Control (2015)  Target Ranges:  Prepandial:   less than 140 mg/dL      Peak postprandial:   less than 180 mg/dL (1-2 hours)      Critically ill patients:  140 - 180 mg/dL   Lab Results  Component Value Date   GLUCAP 300 (H) 05/26/2016   HGBA1C 8.6 (H) 05/25/2016    Review of Glycemic Control  Results for [name], [name] (MRN [id number]) as of 05/26/2016 09:52  Ref. Range 05/25/2016 07:51 05/25/2016 11:24 05/25/2016 16:32 05/25/2016 21:27 05/26/2016 07:31  Glucose-Capillary Latest Ref Range: 65 - 99 mg/dL 381 (H) 344 (H) 391 (H) 273 (H) 300 (H)    Diabetes history: DM2 Outpatient Diabetes medications: Tradjenta 5 mg daily Current orders for Inpatient glycemic control: Novolog 0-9 units TID with meals  Inpatient Diabetes Program Recommendations: If steroids are continued as ordered, please consider ordering Lantus 8 units Q24H starting now. Please note that if Lantus is ordered as requested, it will need to be adjusted as steroids are tapered.  Please consider increasing Novolog to moderate scale and adding Novolog bedtime correction scale.  Gentry Fitz, RN, BA, MHA, CDE Diabetes Coordinator Inpatient Diabetes Program  [phone] (Team Pager) [phone] (Trainer) 05/26/2016 9:53 AM

## 2016-05-26 NOTE — Progress Notes (Signed)
SATURATION QUALIFICATIONS: (This note is used to comply with regulatory documentation for home oxygen)  Patient Saturations on Room Air at Rest = 100%  Patient Saturations on Room Air while Ambulating = 88%  Patient Saturations on 2 Liters of oxygen while Ambulating = 100%  Please briefly explain why patient needs home oxygen:

## 2016-05-26 NOTE — Progress Notes (Signed)
*  PRELIMINARY RESULTS* Echocardiogram 2D Echocardiogram has been performed.  [name] 05/26/2016, 11:49 AM

## 2016-05-26 NOTE — Progress Notes (Signed)
PROGRESS NOTE    [name]  [id number] DOB: 08/23/1966 DOA: 05/24/2016 PCP: Tula Nakayama, MD Outpatient Specialists:  Endocrinology: Elayne Snare, MD     Brief Narrative: 49 yof with hx of diastolic CHF, COPD, HTN, presents with complaints of shortness of breath. She was felt to have COPD exacerbation as well as some element of CHF. She was treated with nebs, steroids and IV lasix. Repeat echo shows a significant decline in EF from 2015 to 20-25%. Cardiology input has been requested. Breathing is improving and steroids are being tapered. She is continued on IV lasix. Will likely need a few more days of treatment..   Assessment & Plan:   Active Problems:   Diabetes mellitus type 2 in obese (HCC)   CKD (chronic kidney disease) stage 3, GFR 30-59 ml/min   COPD exacerbation (HCC)   Acute exacerbation of chronic obstructive pulmonary disease (COPD) (HCC)   Acute on chronic diastolic heart failure ([name])  1. Acute respiratory failure with hypoxia. Likely related to COPD/CHF. Continue wean oxygen as tolerated. 2. COPD exacerbation. CXR unremarkable. Wheezing improving. Will start to taper steroids. Continue on neb treatments and pulmonary hygiene. 3. Acute decompensation of systolic CHF. Continue on IV Lasix. Echo from 2015 showed normal EF. Echo from today shows EF of 20-25% with diffuse hypokinesis. ?related to prolonged hypertension. Since this is a change form prior function, will request cardiology input to see if further work up is necessary. Continue to monitor urine output. She is on BB and ACE 4. HLD. Continue statin 5. CKD stage 3. Cr is mildly elevated at 1.4, which is near baseline. Continue to monitor with diuresis 6. DM type 2. Elevated at 445. Hgb A1C 8.6. Change SSI to resistant 7. Essential uncontrolled HTN. Mildly elevated. Continue antihypertensives   DVT prophylaxis: Lovenox Code Status: Full Family Communication: discussed with patient, no family  present Disposition Plan: Discharge home once improved   Consultants:   None  Procedures:  Echo: - Mild LV chamber dilatation with moderate LVH and LVEF 20-25%.   There is diffuse hypokinesis noted with restrictive diastolic   filling pattern. Moderate left atrial enlargement. Mild MAC with   mild to moderate mitral regurgitation. Trivial aortic   regurgitation. Mild to moderate reduction in RV contraction.    Trivial tricuspid regurgitation with PASP 35 mmHg.  Antimicrobials:  None   Subjective: Admits to anxiety. Overall breathing is improving.  Objective: Vitals:   05/25/16 2132 05/26/16 0030 05/26/16 0545 05/26/16 0642  BP: (!) 155/112 (!) 122/94 (!) 149/122 (!) 123/92  Pulse: (!) 110 (!) 102 92   Resp: 20  (!) 22   Temp: 98 F (36.7 C)  97.8 F (36.6 C)   TempSrc: Oral  Oral   SpO2: 100%  100% 100%  Weight:      Height:        Intake/Output Summary (Last 24 hours) at 05/26/16 0722 Last data filed at 05/26/16 0549  Gross per 24 hour  Intake              600 ml  Output             1525 ml  Net             -925 ml   Filed Weights   05/24/16 2243  Weight: 84.4 kg (186 lb)    Examination:  General exam: Appears mildly anxious but in no distress Respiratory system: Clear to auscultation. Respiratory effort normal. Cardiovascular system: S1 & S2 heard,  RRR.  murmurs, rubs, gallops or clicks. No pedal edema. Gastrointestinal system: Abdomen is nondistended, soft and nontender. No organomegaly or masses felt. Normal bowel sounds heard. Central nervous system: Alert and oriented. No focal neurological deficits. Extremities: Symmetric 5 x 5 power. Skin: No rashes, lesions or ulcers Psychiatry: appears anxious, cooperative     Data Reviewed:   CBC:  Recent Labs Lab 05/24/16 2251 05/25/16 0625  WBC 9.0 7.5  HGB 15.1* 14.1  HCT 46.0 43.6  MCV 97.7 99.1  PLT 219 [id number]   Basic Metabolic Panel:  Recent Labs Lab 05/24/16 2251 05/25/16 0625  NA 140  136  K 3.8 4.5  CL 106 102  CO2 24 25  GLUCOSE 200* 445*  BUN 15 17  CREATININE 1.24* 1.40*  CALCIUM 10.3 9.5   GFR: Estimated Creatinine Clearance: 46.3 mL/min (by C-G formula based on SCr of 1.4 mg/dL). Liver Function Tests:  Recent Labs Lab 05/25/16 0625  AST 23  ALT 21  ALKPHOS 85  BILITOT 0.6  PROT 6.9  ALBUMIN 3.5   No results for input(s): LIPASE, AMYLASE in the last 168 hours. No results for input(s): AMMONIA in the last 168 hours. Coagulation Profile: No results for input(s): INR, PROTIME in the last 168 hours. Cardiac Enzymes: No results for input(s): CKTOTAL, CKMB, CKMBINDEX, TROPONINI in the last 168 hours. BNP (last 3 results) No results for input(s): PROBNP in the last 8760 hours. HbA1C:  Recent Labs  05/25/16 0625  HGBA1C 8.6*   CBG:  Recent Labs Lab 05/25/16 0751 05/25/16 1124 05/25/16 1632 05/25/16 2127  GLUCAP 381* 344* 391* 273*   Lipid Profile: No results for input(s): CHOL, HDL, LDLCALC, TRIG, CHOLHDL, LDLDIRECT in the last 72 hours. Thyroid Function Tests: No results for input(s): TSH, T4TOTAL, FREET4, T3FREE, THYROIDAB in the last 72 hours. Anemia Panel: No results for input(s): VITAMINB12, FOLATE, FERRITIN, TIBC, IRON, RETICCTPCT in the last 72 hours. Urine analysis:    Component Value Date/Time   COLORURINE YELLOW 09/11/2014 1319   APPEARANCEUR CLEAR 09/11/2014 1319   LABSPEC 1.015 09/11/2014 1319   PHURINE 5.5 09/11/2014 1319   GLUCOSEU NEGATIVE 09/11/2014 1319   HGBUR SMALL (A) 09/11/2014 1319   BILIRUBINUR NEGATIVE 09/11/2014 1319   KETONESUR NEGATIVE 09/11/2014 1319   PROTEINUR NEGATIVE 09/11/2014 1319   UROBILINOGEN 0.2 09/11/2014 1319   NITRITE NEGATIVE 09/11/2014 1319   LEUKOCYTESUR TRACE (A) 09/11/2014 1319   Sepsis Labs: @LABRCNTIP (procalcitonin:4,lacticidven:4)  )No results found for this or any previous visit (from the past 240 hour(s)).       Radiology Studies: Dg Chest Port 1 View  Result Date:  05/25/2016 CLINICAL DATA:  50 year old female with shortness of breath EXAM: PORTABLE CHEST 1 VIEW COMPARISON:  Chest radiograph dated 04/03/2015 FINDINGS: Shallow inspiration with bibasilar subsegmental atelectatic changes. Pneumonia is less likely. Clinical correlation is recommended. No focal consolidation, pleural effusion, or pneumothorax. Mild cardiomegaly. No acute osseous pathology. IMPRESSION: No acute cardiopulmonary process. Mild cardiomegaly. Electronically Signed   By: Anner Crete M.D.   On: 05/25/2016 00:26       Scheduled Meds: . antiseptic oral rinse  7 mL Mouth Rinse BID  . azelastine  2 spray Each Nare BID  . benazepril  40 mg Oral Daily  . clopidogrel  75 mg Oral Q breakfast  . enoxaparin (LOVENOX) injection  40 mg Subcutaneous Q24H  . FLUoxetine  20 mg Oral Daily  . fluticasone  2 spray Each Nare Daily  . furosemide  40 mg Intravenous Q12H  .  gabapentin  100 mg Oral TID  . guaiFENesin  1,200 mg Oral BID  . insulin aspart  0-9 Units Subcutaneous TID WC  . ipratropium-albuterol  3 mL Nebulization QID  . methylPREDNISolone (SOLU-MEDROL) injection  60 mg Intravenous Q6H  . metoprolol tartrate  25 mg Oral BID  . mometasone-formoterol  2 puff Inhalation BID  . montelukast  10 mg Oral QHS  . pantoprazole  40 mg Oral Daily  . potassium chloride SA  20 mEq Oral Daily  . rosuvastatin  40 mg Oral Daily  . rosuvastatin  40 mg Oral Daily  . sodium chloride flush  3 mL Intravenous Q12H  . spironolactone  25 mg Oral Daily  . tiZANidine  2 mg Oral BID  . traZODone  50 mg Oral QHS  . varenicline  0.5 mg Oral BID   Continuous Infusions:    LOS: 0 days    Time spent: 25 minutes   Kathie Dike, MD [facility]  If 7PM-7AM, please contact night-coverage [email or web address] Password [email or web address] 05/26/2016, 7:22 AM

## 2016-05-26 NOTE — Progress Notes (Addendum)
CRITICAL VALUE ALERT  Critical value received:  CBG 426  Date of notification:  05/26/2016  Time of notification:  [id number] am  Critical value read back:Yes.    Nurse who received alert:  Radene Gunning  MD notified (1st page):  Dr Roderic Palau  Time of first page:  1129 am  MD notified (2nd page):  Time of second page:  Responding MD:  Dr Roderic Palau  Time MD responded:  1140 am

## 2016-05-27 ENCOUNTER — Encounter (HOSPITAL_COMMUNITY): Payer: Self-pay | Admitting: Cardiology

## 2016-05-27 DIAGNOSIS — I429 Cardiomyopathy, unspecified: Secondary | ICD-10-CM

## 2016-05-27 DIAGNOSIS — I5043 Acute on chronic combined systolic (congestive) and diastolic (congestive) heart failure: Secondary | ICD-10-CM

## 2016-05-27 DIAGNOSIS — I10 Essential (primary) hypertension: Secondary | ICD-10-CM

## 2016-05-27 DIAGNOSIS — F419 Anxiety disorder, unspecified: Secondary | ICD-10-CM

## 2016-05-27 DIAGNOSIS — F1910 Other psychoactive substance abuse, uncomplicated: Secondary | ICD-10-CM

## 2016-05-27 DIAGNOSIS — F322 Major depressive disorder, single episode, severe without psychotic features: Secondary | ICD-10-CM | POA: Diagnosis present

## 2016-05-27 DIAGNOSIS — F329 Major depressive disorder, single episode, unspecified: Secondary | ICD-10-CM

## 2016-05-27 DIAGNOSIS — N183 Chronic kidney disease, stage 3 (moderate): Secondary | ICD-10-CM

## 2016-05-27 LAB — BASIC METABOLIC PANEL
Anion gap: 9 (ref 5–15)
BUN: 46 mg/dL — ABNORMAL HIGH (ref 6–20)
CO2: 26 mmol/L (ref 22–32)
Calcium: 8.8 mg/dL — ABNORMAL LOW (ref 8.9–10.3)
Chloride: 104 mmol/L (ref 101–111)
Creatinine, Ser: 1.73 mg/dL — ABNORMAL HIGH (ref 0.44–1.00)
GFR calc Af Amer: 38 mL/min — ABNORMAL LOW (ref >60)
GFR calc non Af Amer: 33 mL/min — ABNORMAL LOW (ref >60)
Glucose, Bld: 270 mg/dL — ABNORMAL HIGH (ref 65–99)
Potassium: 4.2 mmol/L (ref 3.5–5.1)
Sodium: 139 mmol/L (ref 135–145)

## 2016-05-27 LAB — GLUCOSE, CAPILLARY
Glucose-Capillary: 284 mg/dL — ABNORMAL HIGH (ref 65–99)
Glucose-Capillary: 355 mg/dL — ABNORMAL HIGH (ref 65–99)
Glucose-Capillary: 315 mg/dL — ABNORMAL HIGH (ref 65–99)
Glucose-Capillary: 292 mg/dL — ABNORMAL HIGH (ref 65–99)

## 2016-05-27 LAB — RAPID URINE DRUG SCREEN, HOSP PERFORMED
Amphetamines: NOT DETECTED
Barbiturates: NOT DETECTED
Benzodiazepines: POSITIVE — AB
Cocaine: NOT DETECTED
Opiates: NOT DETECTED
Tetrahydrocannabinol: NOT DETECTED

## 2016-05-27 LAB — CBC
HCT: 40.7 % (ref 36.0–46.0)
Hemoglobin: 13.3 g/dL (ref 12.0–15.0)
MCH: 32.5 pg (ref 26.0–34.0)
MCHC: 32.7 g/dL (ref 30.0–36.0)
MCV: 99.5 fL (ref 78.0–100.0)
Platelets: 178 10*3/uL (ref 150–400)
RBC: 4.09 MIL/uL (ref 3.87–5.11)
RDW: 14.3 % (ref 11.5–15.5)
WBC: 9.5 10*3/uL (ref 4.0–10.5)

## 2016-05-27 MED ORDER — BISOPROLOL FUMARATE 5 MG PO TABS
10.0000 mg | ORAL_TABLET | Freq: Every day | ORAL | Status: DC
  Administered 2016-05-27: 10 mg via ORAL
  Filled 2016-05-27 (×4): qty 1
  Filled 2016-05-27: qty 2

## 2016-05-27 MED ORDER — FUROSEMIDE 10 MG/ML IJ SOLN
40.0000 mg | Freq: Every day | INTRAMUSCULAR | Status: DC
  Administered 2016-05-28 – 2016-05-29 (×2): 40 mg via INTRAVENOUS
  Filled 2016-05-27 (×2): qty 4

## 2016-05-27 MED ORDER — SPIRONOLACTONE 25 MG PO TABS
12.5000 mg | ORAL_TABLET | Freq: Every day | ORAL | Status: DC
  Administered 2016-05-28 – 2016-05-29 (×2): 12.5 mg via ORAL
  Filled 2016-05-27 (×2): qty 1

## 2016-05-27 NOTE — Consult Note (Signed)
Requesting provider: Dr. Carlean Jews Primary cardiologist: Dr. Kate Sable (new) - visit scheduled 05/28/2016 Consulting cardiologist: Dr. Satira Sark  Reason for consultation: Cardiomyopathy and congestive heart failure  Clinical Summary [name] is a 50 y.o.female seen by our practice in the past (last saw [name] back in 2014 with scheduled visit to see Dr. Bronson Ing 05/28/2016) history of hypertensive cardiomyopathy complicated by uncontrolled hypertension. LVEF improved from the range of 45-50% in 2014 up to 60-65% in 2015. She is currently admitted to the hospital with worsening shortness of breath and exertional fatigue. She states that she has had these symptoms over the last few months, but that they got more progressively worse in the span of the last 2 weeks. She does not report chest pain or palpitations. She denies any substance abuse, chart indicates prior history of alcohol abuse and also crack cocaine. She states that she quit smoking cigarettes about 2 weeks ago.  Follow-up echocardiogram obtained during this hospital stay shows LVEF now in the range of 20-25% with diffuse hypokinesis and restrictive diastolic filling pattern. Also mild to moderate reduction in RV contraction. She was initially managed for possible COPD exacerbation. She describes NYHA class IIIB symptoms at this point. Chest x-ray shows cardiomegaly but no edema or effusions. BNP elevated at 667.   No Known Allergies  Medications Scheduled Medications: . antiseptic oral rinse  7 mL Mouth Rinse BID  . azelastine  2 spray Each Nare BID  . benazepril  40 mg Oral Daily  . clopidogrel  75 mg Oral Q breakfast  . enoxaparin (LOVENOX) injection  40 mg Subcutaneous Q24H  . FLUoxetine  20 mg Oral Daily  . fluticasone  2 spray Each Nare Daily  . furosemide  40 mg Intravenous Q12H  . gabapentin  100 mg Oral TID  . guaiFENesin  1,200 mg Oral BID  . insulin aspart  0-20 Units Subcutaneous TID  WC  . insulin aspart  0-5 Units Subcutaneous QHS  . ipratropium-albuterol  3 mL Nebulization QID  . metoprolol tartrate  25 mg Oral BID  . mometasone-formoterol  2 puff Inhalation BID  . montelukast  10 mg Oral QHS  . pantoprazole  40 mg Oral Daily  . potassium chloride SA  20 mEq Oral Daily  . predniSONE  40 mg Oral Q breakfast  . rosuvastatin  40 mg Oral Daily  . sodium chloride flush  3 mL Intravenous Q12H  . spironolactone  25 mg Oral Daily  . tiZANidine  2 mg Oral BID  . traZODone  50 mg Oral QHS  . varenicline  0.5 mg Oral BID    PRN Medications: sodium chloride, albuterol, ALPRAZolam, hydrALAZINE, ondansetron **OR** ondansetron (ZOFRAN) IV, oxyCODONE, sodium chloride flush   Past Medical History:  Diagnosis Date  . Alcohol use ([place])    Now abstinent.  . Ankle fracture, lateral malleolus, closed 12/30/2011  . Anxiety   . Breast mass, left 12/15/2011  . CKD (chronic kidney disease)   . COPD (chronic obstructive pulmonary disease) ([place])   . Diabetes mellitus, type 2 ([place])   . Diabetic Charcot foot ([place]) 12/30/2011  . Essential hypertension   . History of GI bleed 12/05/2009   Qualifier: Diagnosis of  By: Zeb Comfort    . Hyperlipidemia   . Hypertensive cardiomyopathy ([place]) 11/08/2012   Ejection fraction 40-45%.  . Noncompliance with medication regimen   . Obesity   . Panic attacks   . Tobacco abuse  Past Surgical History:  Procedure Laterality Date  . BREAST BIOPSY    . DILATION AND CURETTAGE OF UTERUS      Family History  Problem Relation Age of Onset  . Hypertension Mother   . Heart attack Mother   . Hypertension Father     CABG   . Hypertension Sister   . Hypertension Brother   . Hypertension Sister   . Cancer Sister     breast   . Arthritis    . Cancer    . Diabetes    . Asthma      Social History [name] reports that she has quit smoking. Her smoking use included Cigarettes. She has a 30.00 pack-year smoking history. She has never used  smokeless tobacco. [name] reports that she drinks alcohol.  Review of Systems Complete review of systems negative except as otherwise outlined in the clinical summary and also the following. Worsening dyspnea exertion, intermittent cough, no fevers or chills  Physical Examination Blood pressure (!) 113/112, pulse (!) 106, temperature 97.6 F (36.4 C), temperature source Oral, resp. rate (!) 21, height 5\' 1"  (1.549 m), weight 186 lb (84.4 kg), last menstrual period 04/24/2016, SpO2 93 %.  Intake/Output Summary (Last 24 hours) at 05/27/16 1140 Last data filed at 05/27/16 0636  Gross per 24 hour  Intake              240 ml  Output              250 ml  Net              -10 ml   Telemetry: Sinus rhythm with frequent bursts of PSVT.  Gen.: Obese, short statured woman, short of breath with long sentences. HEENT: Conjunctiva and lids normal, oropharynx clear. Neck: Supple, increased girth with elevated JVP, no carotid bruits, no thyromegaly. Lungs: Diminished breath sounds without wheezing. Cardiac: Indistinct PMI, RRR, no S3, no pericardial rub. Abdomen: Obese, nontender, bowel sounds present, no guarding or rebound. Extremities: No pitting edema, distal pulses 1-2+. Skin: Warm and dry. Musculoskeletal: No kyphosis. Neuropsychiatric: Alert and oriented x3, affect grossly appropriate.  Lab Results  Basic Metabolic Panel:  Recent Labs Lab 05/24/16 2251 05/25/16 0625 05/27/16 0543  NA 140 136 139  K 3.8 4.5 4.2  CL 106 102 104  CO2 24 25 26   GLUCOSE 200* 445* 270*  BUN 15 17 46*  CREATININE 1.24* 1.40* 1.73*  CALCIUM 10.3 9.5 8.8*    Liver Function Tests:  Recent Labs Lab 05/25/16 0625  AST 23  ALT 21  ALKPHOS 85  BILITOT 0.6  PROT 6.9  ALBUMIN 3.5    CBC:  Recent Labs Lab 05/24/16 2251 05/25/16 0625 05/27/16 0543  WBC 9.0 7.5 9.5  HGB 15.1* 14.1 13.3  HCT 46.0 43.6 40.7  MCV 97.7 99.1 99.5  PLT 219 205 178    ECG Tracing from 05/24/2016 shows  sinus tachycardia with leftward axis and nonspecific ST-T changes.  Imaging Echocardiogram 05/26/2016: Study Conclusions  - Left ventricle: The cavity size was mildly dilated. Wall   thickness was increased in a pattern of moderate LVH. Systolic   function was severely reduced. The estimated ejection fraction   was in the range of 20% to 25%. Diffuse hypokinesis. Doppler   parameters are consistent with restrictive physiology, indicative   of decreased left ventricular diastolic compliance and/or   increased left atrial pressure. - Aortic valve: There was trivial regurgitation. - Mitral valve: Calcified annulus. There was  mild to moderate   regurgitation. - Left atrium: The atrium was moderately dilated. - Right ventricle: Systolic function was mildly to moderately   reduced. - Right atrium: Central venous pressure (est): 8 mm Hg. - Tricuspid valve: There was trivial regurgitation. - Pulmonary arteries: PA peak pressure: 35 mm Hg (S). - Pericardium, extracardiac: There was no pericardial effusion.  Impressions:  - Mild LV chamber dilatation with moderate LVH and LVEF 20-25%.   There is diffuse hypokinesis noted with restrictive diastolic   filling pattern. Moderate left atrial enlargement. Mild MAC with   mild to moderate mitral regurgitation. Trivial aortic   regurgitation. Mild to moderate reduction in RV contraction.   Trivial tricuspid regurgitation with PASP 35 mmHg.  Chest x-ray 05/25/2016: FINDINGS: Shallow inspiration with bibasilar subsegmental atelectatic changes. Pneumonia is less likely. Clinical correlation is recommended. No focal consolidation, pleural effusion, or pneumothorax. Mild cardiomegaly. No acute osseous pathology. IMPRESSION: No acute cardiopulmonary process. Mild cardiomegaly.  Impression  1. Newly documented cardiomyopathy with LVEF 20-25% associated with restrictive diastolic filling pattern and also associated mild to moderate RV dysfunction.  At this point suspected nonischemic etiology in light of history of uncontrolled hypertension and previous hypertensive cardiomyopathy. She denies any recent substance abuse but does have history in the past. Current medications reflect outpatient regimen with addition of IV Lasix.  2. Acute (on chronic) combined heart failure, remains symptomatic.  3. Sinus rhythm and sinus tachycardia with frequent bursts of SVT. Also question component of tachycardia-mediated cardiomyopathy.  4. History of COPD and tobacco use, moderate obstructive defect without bronchodilator response as of May 2017 PFTs.  5. Uncontrolled hypertension.  6. Type 2 diabetes mellitus, uncontrolled with recent hemoglobin A1c 8.6.  7. Acute on chronic renal insufficiency with baseline CKD stage 3. Creatinine up to 1.7  8. Prior history of substance abuse, patient currently denies. No UDS with this admission as yet, has been positive in the past.  Recommendations  Medications reviewed. Modifications to be made including reduction in Lasix to 40 mg IV daily with follow-up BMET a.m., try and continue Lotensin unless renal function continues to deteriorate, reduce Aldactone to 12.5 mg daily, and change from Lopressor to more beta-1 selective bisoprolol. Will attempt to titrate medications as tolerated, at this point uncertain how well she will respond or whether she will need further invasive cardiac testing in the short-term. Ideally, would like to get her on a stable oral regimen with improved hemodynamics, and then consider further testing as an outpatient with follow-up per Dr. Bronson Ing. We will follow with you.  Satira Sark, M.D., F.A.C.C.

## 2016-05-27 NOTE — Progress Notes (Signed)
PROGRESS NOTE  [name] [id number] DOB: [birth year] DOA: 05/24/2016 PCP: Tula Nakayama, MD  Brief Narrative: 50 -year-old woman with history of diastolic congestive heart failure, COPD, smoker presented with shortness of breath and lower extremity edema. Treated for COPD exacerbation and CHF. Echocardiogram revealed new cardiomyopathy favored to be nonischemic in nature.  Assessment/Plan: 1. Acute respiratory failure with hypoxia. Likely related to COPD/CHF. Improving. Plan to wean oxygen as tolerated. 2. COPD exacerbation. Improving. Generally good air movement. CXR unremarkable. 3. Acute systolic CHF. ECHO EF of 20-25% with diffuse hypokinesis. No finding. Suspect related to noncompliance. 4. AKI secondary to diuresis, superimposed on chronic kidney disease. 5. DM type 2. Hgb a1c 8.6. Fair control. 6. Essential HTN. Continue antihypertensives. 7. Tobacco use disorder. Patient reports she will not resume smoking. 8. Morbid obesity.   Overall improved. Plan to continue management as per cardiology.  Continue steroid, bronchodilators, Singulair, delivered.  Check BMP in a.m.  DVT prophylaxis: Lovenox Code Status: Full Family Communication: discussed with patient, no family present Disposition Plan: Discharge home once improved  Murray Hodgkins, MD  [facility] Direct contact: [phone] --[place]  --[email or web address]; password [email or web address]  7PM-7AM contact night coverage as above 05/27/2016, 9:04 AM  LOS: 1 day   Consultants:  Cardiology  Procedures:  ECHO Study Conclusions  - Left ventricle: The cavity size was mildly dilated. Wall   thickness was increased in a pattern of moderate LVH. Systolic   function was severely reduced. The estimated ejection fraction   was in the range of 20% to 25%. Diffuse hypokinesis. Doppler   parameters are consistent with restrictive physiology, indicative   of decreased left ventricular diastolic compliance  and/or   increased left atrial pressure. - Aortic valve: There was trivial regurgitation. - Mitral valve: Calcified annulus. There was mild to moderate   regurgitation. - Left atrium: The atrium was moderately dilated. - Right ventricle: Systolic function was mildly to moderately   reduced. - Right atrium: Central venous pressure (est): 8 mm Hg. - Tricuspid valve: There was trivial regurgitation. - Pulmonary arteries: PA peak pressure: 35 mm Hg (S). - Pericardium, extracardiac: There was no pericardial effusion.  Antimicrobials:  none  HPI/Subjective: Feels tired today. Breathing has improved, but still becomes short of breath with exertion. Reports back pain [chronic]. Denies any nausea or vomiting.   Objective: Vitals:   05/26/16 2029 05/26/16 2036 05/26/16 2248 05/27/16 0635  BP:   (!) 142/115 (!) 113/112  Pulse:   (!) 110 (!) 106  Resp:   (!) 22 (!) 21  Temp:   97.3 F (36.3 C) 97.6 F (36.4 C)  TempSrc:   Oral Oral  SpO2: 100% 100% 100% 100%  Weight:      Height:        Intake/Output Summary (Last 24 hours) at 05/27/16 0904 Last data filed at 05/27/16 0636  Gross per 24 hour  Intake              240 ml  Output              250 ml  Net              -10 ml     Filed Weights   05/24/16 2243  Weight: 84.4 kg (186 lb)    Exam:    Constitutional:  . Appears calm and comfortable Respiratory:  . CTA bilaterally, no w/r/r.  . Respiratory effort normal. No retractions or accessory muscle use Cardiovascular:  . RRR, no  m/r/g . No LE extremity edema   Psychiatric:  . judgement and insight appear normal . Mental status o Mood, affect appropriate  I have personally reviewed following labs and imaging studies:  BUN 46, Cr 1.73, trending up  Glucose 270  CBC unremarkable.  UOP 665ml, minus one liter since admission.  Scheduled Meds: . antiseptic oral rinse  7 mL Mouth Rinse BID  . azelastine  2 spray Each Nare BID  . benazepril  40 mg Oral Daily  .  clopidogrel  75 mg Oral Q breakfast  . enoxaparin (LOVENOX) injection  40 mg Subcutaneous Q24H  . FLUoxetine  20 mg Oral Daily  . fluticasone  2 spray Each Nare Daily  . furosemide  40 mg Intravenous Q12H  . gabapentin  100 mg Oral TID  . guaiFENesin  1,200 mg Oral BID  . insulin aspart  0-20 Units Subcutaneous TID WC  . insulin aspart  0-5 Units Subcutaneous QHS  . ipratropium-albuterol  3 mL Nebulization QID  . metoprolol tartrate  25 mg Oral BID  . mometasone-formoterol  2 puff Inhalation BID  . montelukast  10 mg Oral QHS  . pantoprazole  40 mg Oral Daily  . potassium chloride SA  20 mEq Oral Daily  . predniSONE  40 mg Oral Q breakfast  . rosuvastatin  40 mg Oral Daily  . sodium chloride flush  3 mL Intravenous Q12H  . spironolactone  25 mg Oral Daily  . tiZANidine  2 mg Oral BID  . traZODone  50 mg Oral QHS  . varenicline  0.5 mg Oral BID   Continuous Infusions:   Active Problems:   Diabetes mellitus type 2 in obese (HCC)   CKD (chronic kidney disease) stage 3, GFR 30-59 ml/min   COPD exacerbation (HCC)   Acute exacerbation of chronic obstructive pulmonary disease (COPD) ([place])   Acute on chronic diastolic heart failure (HCC)   LOS: 1 day   Time spent 25 minutes  By signing my name below, I, Delene Ruffini, attest that this documentation has been prepared under the direction and in the presence of [place]. Sarajane Jews, MD. Electronically Signed: Delene Ruffini, Scribe.  05/27/16 2:15pm    I personally performed the services described in this documentation. All medical record entries made by the scribe were at my direction. I have reviewed the chart and agree that the record reflects my personal performance and is accurate and complete. Murray Hodgkins, MD

## 2016-05-27 NOTE — Care Management Important Message (Signed)
Important Message  Patient Details  Name: [name] MRN: [id number] Date of Birth: 1966/04/13   Medicare Important Message Given:  Yes    Kalid Ghan, Chauncey Reading, RN 05/27/2016, 12:14 PM

## 2016-05-27 NOTE — Progress Notes (Signed)
Inpatient Diabetes Program Recommendations  AACE/ADA: New Consensus Statement on Inpatient Glycemic Control (2015)  Target Ranges:  Prepandial:   less than 140 mg/dL      Peak postprandial:   less than 180 mg/dL (1-2 hours)      Critically ill patients:  140 - 180 mg/dL   Lab Results  Component Value Date   GLUCAP 355 (H) 05/27/2016   HGBA1C 8.6 (H) 05/25/2016  Results for [name], [name] (MRN [id number]) as of 05/27/2016 12:02  Ref. Range 05/27/2016 08:24 05/27/2016 11:05  Glucose-Capillary Latest Ref Range: 65 - 99 mg/dL 284 (H) 355 (H)    Review of Glycemic Control  FBS elevated. Steroids being tapered - Prednisone 40 mg QD Needs basal insulin.  Recommendations: Lantus 8 units QHS  Will continue to follow. Thank you. Lorenda Peck, RD, LDN, CDE Inpatient Diabetes Coordinator [phone]

## 2016-05-28 ENCOUNTER — Ambulatory Visit: Payer: Self-pay | Admitting: Cardiovascular Disease

## 2016-05-28 DIAGNOSIS — J441 Chronic obstructive pulmonary disease with (acute) exacerbation: Secondary | ICD-10-CM

## 2016-05-28 LAB — BASIC METABOLIC PANEL
Anion gap: 7 (ref 5–15)
BUN: 46 mg/dL — ABNORMAL HIGH (ref 6–20)
CO2: 26 mmol/L (ref 22–32)
Calcium: 8.5 mg/dL — ABNORMAL LOW (ref 8.9–10.3)
Chloride: 105 mmol/L (ref 101–111)
Creatinine, Ser: 1.67 mg/dL — ABNORMAL HIGH (ref 0.44–1.00)
GFR calc Af Amer: 40 mL/min — ABNORMAL LOW (ref >60)
GFR calc non Af Amer: 34 mL/min — ABNORMAL LOW (ref >60)
Glucose, Bld: 175 mg/dL — ABNORMAL HIGH (ref 65–99)
Potassium: 4.1 mmol/L (ref 3.5–5.1)
Sodium: 138 mmol/L (ref 135–145)

## 2016-05-28 LAB — GLUCOSE, CAPILLARY
Glucose-Capillary: 194 mg/dL — ABNORMAL HIGH (ref 65–99)
Glucose-Capillary: 375 mg/dL — ABNORMAL HIGH (ref 65–99)
Glucose-Capillary: 504 mg/dL (ref 65–99)

## 2016-05-28 MED ORDER — PREDNISONE 20 MG PO TABS
30.0000 mg | ORAL_TABLET | Freq: Every day | ORAL | Status: DC
  Administered 2016-05-29: 30 mg via ORAL
  Filled 2016-05-28: qty 1

## 2016-05-28 MED ORDER — INSULIN ASPART 100 UNIT/ML ~~LOC~~ SOLN
25.0000 [IU] | Freq: Once | SUBCUTANEOUS | Status: AC
  Administered 2016-05-28: 25 [IU] via SUBCUTANEOUS

## 2016-05-28 MED ORDER — BISOPROLOL FUMARATE 5 MG PO TABS
15.0000 mg | ORAL_TABLET | Freq: Every day | ORAL | Status: DC
  Administered 2016-05-28 – 2016-05-29 (×2): 15 mg via ORAL
  Filled 2016-05-28 (×5): qty 3

## 2016-05-28 NOTE — Progress Notes (Signed)
SATURATION QUALIFICATIONS: (This note is used to comply with regulatory documentation for home oxygen)  Patient Saturations on Room Air at Rest = 96  Patient Saturations on Room Air while Ambulating = 94   

## 2016-05-28 NOTE — Progress Notes (Signed)
MD notified about CBG of 504. MD ordered 25 units Novolog. Units given. RN will continue to monitor. Oswald Hillock, RN

## 2016-05-28 NOTE — Progress Notes (Signed)
CRITICAL VALUE ALERT  Critical value received:  CBG 504   Date of notification:  05/28/16  Time of notification:  [id number]  Critical value read back:Yes.    Nurse who received alert:  Theola Sequin RN  MD notified (1st page):  Sarajane Jews RM   Time of first page:  1730  MD notified (2nd page):  Time of second page:  Responding MD:  Sarajane Jews MD  Time MD responded:  [id number]

## 2016-05-28 NOTE — Progress Notes (Signed)
Primary cardiologist: Dr. Kate Sable  Subjective:  SOB improved. No chest pain.  Objective:  Vital Signs in the last 24 hours: Temp:  [97.7 F (36.5 C)-98.2 F (36.8 C)] 98.1 F (36.7 C) (08/03 0529) Pulse Rate:  [76-105] 99 (08/03 0529) Resp:  [16-20] 20 (08/03 0529) BP: (124-127)/(95-103) 125/98 (08/03 0529) SpO2:  [93 %-100 %] 100 % (08/03 0810)  Intake/Output from previous day:  Intake/Output Summary (Last 24 hours) at 05/28/16 [id number] Last data filed at 05/27/16 1807  Gross per 24 hour  Intake              480 ml  Output                0 ml  Net              480 ml    Physical Exam: General appearance: alert, cooperative, no distress and morbidly obese Lungs: decreased BS, no wheezing Heart: regular rate and rhythm Extremities: no edema Neurologic: Grossly normal   Rate: 90  Rhythm: Sinus rhythm and sinus tachycardia, frequent PACs, burst of regular SVT noted.  Lab Results:  Recent Labs  05/27/16 0543  WBC 9.5  HGB 13.3  PLT 178    Recent Labs  05/27/16 0543 05/28/16 0517  NA 139 138  K 4.2 4.1  CL 104 105  CO2 26 26  GLUCOSE 270* 175*  BUN 46* 46*  CREATININE 1.73* 1.67*   Scheduled Meds: . antiseptic oral rinse  7 mL Mouth Rinse BID  . azelastine  2 spray Each Nare BID  . benazepril  40 mg Oral Daily  . bisoprolol  10 mg Oral Daily  . clopidogrel  75 mg Oral Q breakfast  . enoxaparin (LOVENOX) injection  40 mg Subcutaneous Q24H  . FLUoxetine  20 mg Oral Daily  . fluticasone  2 spray Each Nare Daily  . furosemide  40 mg Intravenous Daily  . gabapentin  100 mg Oral TID  . guaiFENesin  1,200 mg Oral BID  . insulin aspart  0-20 Units Subcutaneous TID WC  . insulin aspart  0-5 Units Subcutaneous QHS  . ipratropium-albuterol  3 mL Nebulization QID  . mometasone-formoterol  2 puff Inhalation BID  . montelukast  10 mg Oral QHS  . pantoprazole  40 mg Oral Daily  . potassium chloride SA  20 mEq Oral Daily  . predniSONE  40 mg Oral Q  breakfast  . rosuvastatin  40 mg Oral Daily  . sodium chloride flush  3 mL Intravenous Q12H  . spironolactone  12.5 mg Oral Daily  . tiZANidine  2 mg Oral BID  . traZODone  50 mg Oral QHS  . varenicline  0.5 mg Oral BID   Continuous Infusions:  PRN Meds:.sodium chloride, albuterol, ALPRAZolam, hydrALAZINE, ondansetron **OR** ondansetron (ZOFRAN) IV, oxyCODONE, sodium chloride flush  Imaging: Imaging results have been reviewed  Cardiac Studies: Echo 05/26/16 Study Conclusions  - Left ventricle: The cavity size was mildly dilated. Wall   thickness was increased in a pattern of moderate LVH. Systolic   function was severely reduced. The estimated ejection fraction   was in the range of 20% to 25%. Diffuse hypokinesis. Doppler   parameters are consistent with restrictive physiology, indicative   of decreased left ventricular diastolic compliance and/or   increased left atrial pressure. - Aortic valve: There was trivial regurgitation. - Mitral valve: Calcified annulus. There was mild to moderate   regurgitation. - Left atrium: The atrium was moderately dilated. -  Right ventricle: Systolic function was mildly to moderately   reduced. - Right atrium: Central venous pressure (est): 8 mm Hg. - Tricuspid valve: There was trivial regurgitation. - Pulmonary arteries: PA peak pressure: 35 mm Hg (S). - Pericardium, extracardiac: There was no pericardial effusion.  Impressions:  - Mild LV chamber dilatation with moderate LVH and LVEF 20-25%.   There is diffuse hypokinesis noted with restrictive diastolic   filling pattern. Moderate left atrial enlargement. Mild MAC with   mild to moderate mitral regurgitation. Trivial aortic   regurgitation. Mild to moderate reduction in RV contraction.   Trivial tricuspid regurgitation with PASP 35 mmHg.  Assessment/Plan:  50 y.o.morbidly obese AA female with a history of hypertensive cardiomyopathy complicated by uncontrolled hypertension, DM, and  CKD-3. She has had an EF of 45% in the past. Admitted 05/24/16 with COPD exacerbation and CHF. EF now 25%.   Active Problems:   Acute exacerbation of chronic obstructive pulmonary disease (COPD) (H[facility])   Acute on chronic combined systolic (congestive) and diastolic (congestive) heart failure (HCC)   Cardiomyopathy-etiology not yet determined (suspect hypertensive)   Diabetes mellitus type 2 in obese- uncontrolled   HTN (hypertension), malignant   CKD (chronic kidney disease) stage 3, GFR 30-59 ml/min   Polysubstance abuse (including cocaine)   Morbid obesity (HCC)   Chronic pain syndrome   Hyperlipidemia LDL goal <70   Anxiety and depression   PLAN:  Urine output incomplete. No recent weight. Will review telemetry with MD.   Kerin Ransom PA-C 05/28/2016, 9:37 AM [phone]   Attending note:  Patient seen and examined. Discussed the case with Mr. Reino Bellis. Please see full consultation note from yesterday with recommendations. Patient reports less shortness of breath. Follow-up lab work shows creatinine 1.7 down to 1.6. UDS positive only for benzodiazepines. Review of telemetry finds sinus rhythm and sinus tachycardia with PACs and a burst of regular SVT, doubt atrial fibrillation. Current medical regimen includes bisoprolol, Lotensin, IV Lasix, and Aldactone. Plan to increase bisoprolol dose further, would continue IV Lasix. Check weight daily and try and achieve more accurate recording of urine output.  Satira Sark, M.D., F.A.C.C.

## 2016-05-28 NOTE — Progress Notes (Signed)
Inpatient Diabetes Program Recommendations  AACE/ADA: New Consensus Statement on Inpatient Glycemic Control (2015)  Target Ranges:  Prepandial:   less than 140 mg/dL      Peak postprandial:   less than 180 mg/dL (1-2 hours)      Critically ill patients:  140 - 180 mg/dL  Results for SHELDA, MATSUNAGA (MRN [id number]) as of 05/28/2016 08:45  Ref. Range 05/27/2016 08:24 05/27/2016 11:05 05/27/2016 17:04 05/27/2016 21:12 05/28/2016 07:34  Glucose-Capillary Latest Ref Range: 65 - 99 mg/dL 284 (H)  Novolog 11 units 355 (H)  Novolog 15 units 315 (H)  Novolog 15 units  292 (H)  Novolog 3 units 194 (H)  Novolog 4 units   Results for [name], [name] (MRN [id number]) as of 05/28/2016 08:45  Ref. Range 05/25/2016 06:25  Hemoglobin A1C Latest Ref Range: 4.8 - 5.6 % 8.6 (H)   Review of Glycemic Control  Current orders for Inpatient glycemic control: Novolog 0-20 units TID with meals, Novolog 0-5 units QHS  Inpatient Diabetes Program Recommendations: Insulin - Basal: Please consider ordering Lantus 8 units x1 now and address basal insulin daily since steroids are being titrated. Insulin - Meal Coverage: If steroids are continued, please consider ordering Novolog 5 units TID with meals for meal coverage.  Thanks, Barnie Alderman, RN, MSN, CDE Diabetes Coordinator Inpatient Diabetes Program [phone] (Team Pager from [place] to [place]) [phone] ([place] office) [phone] [facility] office) [phone] [facility] 2 office)

## 2016-05-28 NOTE — Progress Notes (Signed)
PROGRESS NOTE  [name] [id number] DOB: [birth year] DOA: 05/24/2016 PCP: Tula Nakayama, MD  Brief Narrative: 50 -year-old woman with history of diastolic congestive heart failure, COPD, smoker presented with shortness of breath and lower extremity edema. Treated for COPD exacerbation and CHF. Echocardiogram revealed new cardiomyopathy favored to be nonischemic in nature.  Assessment/Plan: 1. Acute respiratory failure with hypoxia. Resolved. Secondary to COPD and CHF.  2. COPD exacerbation. Continues to improve. 3. Acute systolic congestive heart failure, continues to improve. New finding of diffuse hypokinesis thought secondary to noncompliance. 4. Acute kidney injury secondary to diuresis, somewhat improved today. 5. DM type 2. Hgb a1c 8.6. Fair control. 6. Essential HTN.  7. Polysubstance abuse. UDS positive for benzodiazepines. 8. Tobacco use disorder. Patient reports she will not resume smoking. 9. Morbid obesity.   Overall improving. Continue current management per cardiology  Continue IV Lasix, strict IO, daily weights. Will wean O2 and reassess home O2 needs. Wean steroids, continue bronchodilators.  DVT prophylaxis: Lovenox Code Status: Full Family Communication: discussed with patient, no family present  Disposition Plan: Discharge home once improved  Murray Hodgkins, MD  [facility] Direct contact: [phone] --[place]ia [place]  --[email or web address]; password [email or web address]  7PM-7AM contact night coverage as above 05/28/2016, 6:11 AM  LOS: 2 days   Consultants:  Cardiology  Procedures:  ECHO Study Conclusions  - Left ventricle: The cavity size was mildly dilated. Wall   thickness was increased in a pattern of moderate LVH. Systolic   function was severely reduced. The estimated ejection fraction   was in the range of 20% to 25%. Diffuse hypokinesis. Doppler   parameters are consistent with restrictive physiology, indicative   of decreased left  ventricular diastolic compliance and/or   increased left atrial pressure. - Aortic valve: There was trivial regurgitation. - Mitral valve: Calcified annulus. There was mild to moderate   regurgitation. - Left atrium: The atrium was moderately dilated. - Right ventricle: Systolic function was mildly to moderately   reduced. - Right atrium: Central venous pressure (est): 8 mm Hg. - Tricuspid valve: There was trivial regurgitation. - Pulmonary arteries: PA peak pressure: 35 mm Hg (S). - Pericardium, extracardiac: There was no pericardial effusion.  Antimicrobials:  none  HPI/Subjective: Feels well today. Slept well. Breathing has improved. Eating and urinating well. No abd pain.  Objective: Vitals:   05/27/16 1932 05/27/16 1945 05/27/16 2113 05/28/16 0529  BP:   (!) 124/95 (!) 125/98  Pulse: 82  76 99  Resp: 16  20 20   Temp:   98.2 F (36.8 C) 98.1 F (36.7 C)  TempSrc:   Oral Oral  SpO2: 96% 98% 100% 100%  Weight:      Height:        Intake/Output Summary (Last 24 hours) at 05/28/16 0611 Last data filed at 05/27/16 1807  Gross per 24 hour  Intake              720 ml  Output              250 ml  Net              470 ml     Filed Weights   05/24/16 2243  Weight: 84.4 kg (186 lb)    Exam:  Constitutional:  . Appears calm and comfortable Respiratory:  . CTA bilaterally, no w/r/r.  . Respiratory effort normal. No retractions or accessory muscle use Cardiovascular:  . RRR, no m/r/g . No LE extremity edema   .  Telemetry SR with brief narrow complex tachycardia. Psychiatric:  . judgement and insight appear normal . Mental status o Mood, affect appropriate  I have personally reviewed following labs and imaging studies:  BUN 46, Cr 1.67, stable  Glucose 175  UOP only partially recorded  UDS positive for benzos  Scheduled Meds: . antiseptic oral rinse  7 mL Mouth Rinse BID  . azelastine  2 spray Each Nare BID  . benazepril  40 mg Oral Daily  .  bisoprolol  10 mg Oral Daily  . clopidogrel  75 mg Oral Q breakfast  . enoxaparin (LOVENOX) injection  40 mg Subcutaneous Q24H  . FLUoxetine  20 mg Oral Daily  . fluticasone  2 spray Each Nare Daily  . furosemide  40 mg Intravenous Daily  . gabapentin  100 mg Oral TID  . guaiFENesin  1,200 mg Oral BID  . insulin aspart  0-20 Units Subcutaneous TID WC  . insulin aspart  0-5 Units Subcutaneous QHS  . ipratropium-albuterol  3 mL Nebulization QID  . mometasone-formoterol  2 puff Inhalation BID  . montelukast  10 mg Oral QHS  . pantoprazole  40 mg Oral Daily  . potassium chloride SA  20 mEq Oral Daily  . predniSONE  40 mg Oral Q breakfast  . rosuvastatin  40 mg Oral Daily  . sodium chloride flush  3 mL Intravenous Q12H  . spironolactone  12.5 mg Oral Daily  . tiZANidine  2 mg Oral BID  . traZODone  50 mg Oral QHS  . varenicline  0.5 mg Oral BID   Continuous Infusions:   Active Problems:   Diabetes mellitus type 2 in obese- uncontrolled   Morbid obesity ([place])   HTN (hypertension), malignant   CKD (chronic kidney disease) stage 3, GFR 30-59 ml/min   Polysubstance abuse (including cocaine)   Chronic pain syndrome   Hyperlipidemia LDL goal <70   Acute exacerbation of chronic obstructive pulmonary disease (COPD) (HCC)   Acute on chronic combined systolic (congestive) and diastolic (congestive) heart failure (HCC)   Anxiety and depression   Cardiomyopathy-etiology not yet determined (suspect hypertensive)   LOS: 2 days   Time spent 25 minutes  By signing my name below, I, Delene Ruffini, attest that this documentation has been prepared under the direction and in the presence of Conchita Truxillo P. Sarajane Jews, MD. Electronically Signed: Delene Ruffini, Scribe.  05/28/16 11:10am   I personally performed the services described in this documentation. All medical record entries made by the scribe were at my direction. I have reviewed the chart and agree that the record reflects my personal  performance and is accurate and complete. Murray Hodgkins, MD

## 2016-05-29 ENCOUNTER — Other Ambulatory Visit: Payer: Self-pay | Admitting: Family Medicine

## 2016-05-29 DIAGNOSIS — G894 Chronic pain syndrome: Secondary | ICD-10-CM

## 2016-05-29 DIAGNOSIS — E785 Hyperlipidemia, unspecified: Secondary | ICD-10-CM

## 2016-05-29 DIAGNOSIS — F418 Other specified anxiety disorders: Secondary | ICD-10-CM

## 2016-05-29 LAB — BASIC METABOLIC PANEL
Anion gap: 5 (ref 5–15)
BUN: 40 mg/dL — ABNORMAL HIGH (ref 6–20)
CO2: 30 mmol/L (ref 22–32)
Calcium: 8.6 mg/dL — ABNORMAL LOW (ref 8.9–10.3)
Chloride: 103 mmol/L (ref 101–111)
Creatinine, Ser: 1.57 mg/dL — ABNORMAL HIGH (ref 0.44–1.00)
GFR calc Af Amer: 43 mL/min — ABNORMAL LOW (ref >60)
GFR calc non Af Amer: 37 mL/min — ABNORMAL LOW (ref >60)
Glucose, Bld: 199 mg/dL — ABNORMAL HIGH (ref 65–99)
Potassium: 3.8 mmol/L (ref 3.5–5.1)
Sodium: 138 mmol/L (ref 135–145)

## 2016-05-29 LAB — GLUCOSE, CAPILLARY
Glucose-Capillary: 265 mg/dL — ABNORMAL HIGH (ref 65–99)
Glucose-Capillary: 157 mg/dL — ABNORMAL HIGH (ref 65–99)
Glucose-Capillary: 193 mg/dL — ABNORMAL HIGH (ref 65–99)

## 2016-05-29 MED ORDER — BISOPROLOL FUMARATE 5 MG PO TABS: 15.0000 mg | ORAL_TABLET | Freq: Every day | ORAL | 0 refills | Status: DC

## 2016-05-29 MED ORDER — PREDNISONE 10 MG PO TABS: ORAL_TABLET | ORAL | 0 refills | Status: DC

## 2016-05-29 NOTE — Progress Notes (Signed)
Patient with orders to be discharge home. Discharge instructions given, patient verbalized understanding. Prescriptions given. Patient stable. Patient left in private vehicle with family.  

## 2016-05-29 NOTE — Progress Notes (Signed)
Primary cardiologist: Dr. Kate Sable  Subjective:  SOB improved. No chest pain. She wants to go home  Objective:  Vital Signs in the last 24 hours: Temp:  [98.2 F (36.8 C)] 98.2 F (36.8 C) (08/04 0626) Pulse Rate:  [75-80] 75 (08/04 0626) Resp:  [18-20] 20 (08/04 0626) BP: (129-132)/(84-98) 129/98 (08/04 0626) SpO2:  [92 %-100 %] 98 % (08/04 0721) Weight:  [196 lb 10.8 oz (89.2 kg)-197 lb 6.4 oz (89.5 kg)] 196 lb 10.8 oz (89.2 kg) (08/04 0656)  Intake/Output from previous day:  Intake/Output Summary (Last 24 hours) at 05/29/16 0817 Last data filed at 05/28/16 1850  Gross per 24 hour  Intake              480 ml  Output              750 ml  Net             -270 ml    Physical Exam: General appearance: alert, cooperative, no distress and morbidly obese Lungs: clear no wheezing Heart: regular rate and rhythm Extremities: no edema Neurologic: Grossly normal   Rate: 90  Rhythm: Sinus rhythm and sinus tachycardia, frequent PACs, burst of regular SVT noted.  Lab Results:  Recent Labs  05/27/16 0543  WBC 9.5  HGB 13.3  PLT 178    Recent Labs  05/27/16 0543 05/28/16 0517  NA 139 138  K 4.2 4.1  CL 104 105  CO2 26 26  GLUCOSE 270* 175*  BUN 46* 46*  CREATININE 1.73* 1.67*   Scheduled Meds: . antiseptic oral rinse  7 mL Mouth Rinse BID  . azelastine  2 spray Each Nare BID  . benazepril  40 mg Oral Daily  . bisoprolol  15 mg Oral Daily  . clopidogrel  75 mg Oral Q breakfast  . enoxaparin (LOVENOX) injection  40 mg Subcutaneous Q24H  . FLUoxetine  20 mg Oral Daily  . fluticasone  2 spray Each Nare Daily  . furosemide  40 mg Intravenous Daily  . gabapentin  100 mg Oral TID  . guaiFENesin  1,200 mg Oral BID  . insulin aspart  0-20 Units Subcutaneous TID WC  . insulin aspart  0-5 Units Subcutaneous QHS  . ipratropium-albuterol  3 mL Nebulization QID  . mometasone-formoterol  2 puff Inhalation BID  . montelukast  10 mg Oral QHS  . pantoprazole  40  mg Oral Daily  . potassium chloride SA  20 mEq Oral Daily  . predniSONE  30 mg Oral Q breakfast  . rosuvastatin  40 mg Oral Daily  . sodium chloride flush  3 mL Intravenous Q12H  . spironolactone  12.5 mg Oral Daily  . tiZANidine  2 mg Oral BID  . traZODone  50 mg Oral QHS  . varenicline  0.5 mg Oral BID   Continuous Infusions:  PRN Meds:.sodium chloride, albuterol, ALPRAZolam, hydrALAZINE, ondansetron **OR** ondansetron (ZOFRAN) IV, oxyCODONE, sodium chloride flush  Imaging: Imaging results have been reviewed  Cardiac Studies: Echo 05/26/16 Study Conclusions  - Left ventricle: The cavity size was mildly dilated. Wall   thickness was increased in a pattern of moderate LVH. Systolic   function was severely reduced. The estimated ejection fraction   was in the range of 20% to 25%. Diffuse hypokinesis. Doppler   parameters are consistent with restrictive physiology, indicative   of decreased left ventricular diastolic compliance and/or   increased left atrial pressure. - Aortic valve: There was trivial regurgitation. - Mitral  valve: Calcified annulus. There was mild to moderate   regurgitation. - Left atrium: The atrium was moderately dilated. - Right ventricle: Systolic function was mildly to moderately   reduced. - Right atrium: Central venous pressure (est): 8 mm Hg. - Tricuspid valve: There was trivial regurgitation. - Pulmonary arteries: PA peak pressure: 35 mm Hg (S). - Pericardium, extracardiac: There was no pericardial effusion.  Impressions:  - Mild LV chamber dilatation with moderate LVH and LVEF 20-25%.   There is diffuse hypokinesis noted with restrictive diastolic   filling pattern. Moderate left atrial enlargement. Mild MAC with   mild to moderate mitral regurgitation. Trivial aortic   regurgitation. Mild to moderate reduction in RV contraction.   Trivial tricuspid regurgitation with PASP 35 mmHg.  Assessment/Plan:  50 y.o.morbidly obese AA female with a  history of hypertensive cardiomyopathy complicated by uncontrolled hypertension, DM, and CKD-3. She has had an EF of 45% in the past. Admitted 05/24/16 with COPD exacerbation and CHF. EF now 25%.   Active Problems:   Acute exacerbation of chronic obstructive pulmonary disease (COPD) (HCC)   Acute on chronic combined systolic (congestive) and diastolic (congestive) heart failure (HCC)   Cardiomyopathy-etiology not yet determined (suspect hypertensive)   Diabetes mellitus type 2 in obese- uncontrolled   HTN (hypertension), malignant   CKD (chronic kidney disease) stage 3, GFR 30-59 ml/min   Polysubstance abuse (including cocaine)   Morbid obesity (HCC)   Chronic pain syndrome   Hyperlipidemia LDL goal <70   Anxiety and depression   PLAN:  Its not clear she had a significant diuresis but she feels improved. Would probably send her home on Lasix 40 mg daily. She will need close OP f/u with TOC visit in 7-14 days. Consider Rt and Lt heart cath as an OP- will review with MD.   Kerin Ransom PA-C 05/29/2016, 8:17 AM [phone]  The patient was seen and examined, and I agree with the physical exam, assessment and plan as documented above by L. Kilroy, with modifications as noted below. She is feeling well and wants to go home. Agree with Lasix 40 mg daily. We will arrange for outpatient follow up. Once she is stable on an outpatient medication regimen, I agree with pursuing right and left heart catheterization with coronary angiography.  Kate Sable, MD, [facility]  05/29/2016 11:40 AM

## 2016-05-29 NOTE — Discharge Summary (Signed)
Physician Discharge Summary  [name] [id number] DOB: 04-13-1966 DOA: 05/24/2016  PCP: Tula Nakayama, MD  Admit date: 05/24/2016 Discharge date: 05/29/2016  Recommendations for Outpatient Follow-up:  1. Follow up with PCP in 1-2 weeks  Follow-up Information    Tula Nakayama, MD. Schedule an appointment as soon as possible for a visit today.   Specialty:  Family Medicine Contact information: [place] [address] [phone]        Alonza Bogus, MD. Schedule an appointment as soon as possible for a visit in 2 week(s).   Specialty:  Pulmonary Disease Contact information: [address] [place] [address] [phone]        Jory Sims, NP. Schedule an appointment as soon as possible for a visit in 1 week(s).   Specialties:  Nurse Practitioner, Radiology, Cardiology Contact information: [place] [address] [phone]          Discharge Diagnoses:  1. Acute respiratory failure with hypoxia 2. COPD exacerbation 3. Acute systolic CHF 4. AKI 5. DM type 2 6. Essential HTN 7. Polysubstance abuse 8. Tobacco use disorder 9. Morbid obesity  Discharge Condition: Stable Disposition: Home  Diet recommendation: Carb modified  Filed Weights   05/24/16 2243 05/28/16 1023 05/29/16 0656  Weight: 84.4 kg (186 lb) 89.5 kg (197 lb 6.4 oz) 89.2 kg (196 lb 10.8 oz)    History of present illness:  50 -year-old woman with history of diastolic congestive heart failure, COPD, smoker presented with shortness of breath and lower extremity edema.  Hospital Course:  Treated for COPD exacerbationWith steroids, bronchodilators and oxygen and CHF with diuresis and beta blocker. Echocardiogram revealed new cardiomyopathy favored to be nonischemic in nature. Cardiology consulted and adjusted medications. Plans to pursue outpatient left and right heart cath. Hospitalization was  uncomplicated.  1. Acute respiratory failure with hypoxia. Resolved. Secondary to COPD and CHF.  2. COPD exacerbation. Appears to be at baseline. 3. Acute systolic congestive heart failure, appears to be euvolemic at this time. New finding of diffuse hypokinesis thought secondary to noncompliance. Cardiology has recommended medication adjustments and will follow-up as an outpatient. 4. Acute kidney injury secondary to diuresis and CHF. Improving. 5. DM type 2. Hgb a1c 8.6. Blood sugars much improved today. 6. Essential HTN. Stable 7. Polysubstance abuse. UDS positive for benzodiazepines. 8. Tobacco use disorder. Counseled on the importance of cessation. 9. Morbid obesity.  Consultants:  Cardiology  Procedures:  ECHO Study Conclusions  - Left ventricle: The cavity size was mildly dilated. Wall thickness was increased in a pattern of moderate LVH. Systolic function was severely reduced. The estimated ejection fraction was in the range of 20% to 25%. Diffuse hypokinesis. Doppler parameters are consistent with restrictive physiology, indicative of decreased left ventricular diastolic compliance and/or increased left atrial pressure. - Aortic valve: There was trivial regurgitation. - Mitral valve: Calcified annulus. There was mild to moderate regurgitation. - Left atrium: The atrium was moderately dilated. - Right ventricle: Systolic function was mildly to moderately reduced. - Right atrium: Central venous pressure (est): 8 mm Hg. - Tricuspid valve: There was trivial regurgitation. - Pulmonary arteries: PA peak pressure: 35 mm Hg (S). - Pericardium, extracardiac: There was no pericardial effusion.  Antimicrobials:  none  Discharge Instructions  Discharge Instructions    (HEART FAILURE PATIENTS) Call MD:  Anytime you have any of the following symptoms: 1) 3 pound weight gain in 24 hours or 5 pounds in 1 week 2)  shortness of breath, with or without a dry  hacking cough 3) swelling in the hands, feet or stomach 4) if you have to sleep on extra pillows at night in order to breathe.    Complete by:  As directed   Diet - low sodium heart healthy    Complete by:  As directed   Diet Carb Modified    Complete by:  As directed   Discharge instructions    Complete by:  As directed   Call your physician or seek immediate medical attention for shortness of breath, weight gain, swelling or worsening of condition.       Medication List    STOP taking these medications   metoprolol tartrate 25 MG tablet Commonly known as:  LOPRESSOR   metroNIDAZOLE 500 MG tablet Commonly known as:  FLAGYL   predniSONE 5 MG (21) Tbpk tablet Commonly known as:  STERAPRED UNI-PAK 21 TAB Replaced by:  predniSONE 10 MG tablet   triamterene-hydrochlorothiazide 37.5-25 MG tablet Commonly known as:  MAXZIDE-25     TAKE these medications   albuterol 108 (90 Base) MCG/ACT inhaler Commonly known as:  PROVENTIL HFA;VENTOLIN HFA Inhale 2 puffs into the lungs every 6 (six) hours as needed for wheezing or shortness of breath.   azelastine 0.1 % nasal spray Commonly known as:  ASTELIN USE 2 SPRAYS IN EACH NOSTRIL TWICE DAILY.   benazepril 40 MG tablet Commonly known as:  LOTENSIN TAKE 1 TABLET BY MOUTH ONCE A DAY.   bisoprolol 5 MG tablet Commonly known as:  ZEBETA Take 3 tablets (15 mg total) by mouth daily.   CHANTIX STARTING MONTH PAK 0.5 MG X 11 & 1 MG X 42 tablet Generic drug:  varenicline Take 0.5 mg by mouth 2 (two) times daily.   Choline Fenofibrate 135 MG capsule Take 1 capsule (135 mg total) by mouth daily.   clopidogrel 75 MG tablet Commonly known as:  PLAVIX TAKE 1 TABLET BY MOUTH DAILY WITH BREAKFAST.   FLUoxetine 20 MG capsule Commonly known as:  PROZAC TAKE 1 CAPSULE BY MOUTH EVERY DAY.   fluticasone 50 MCG/ACT nasal spray Commonly known as:  FLONASE USE 2 SPRAYS IN EACH NOSTRIL DAILY.   furosemide 40 MG tablet Commonly known as:   LASIX TAKE 1 TABLET BY MOUTH ONCE A DAY.   gabapentin 100 MG capsule Commonly known as:  NEURONTIN TAKE 1 CAPSULE BY MOUTH THREE TIMES A DAY.   hydrOXYzine 50 MG tablet Commonly known as:  ATARAX/VISTARIL TAKE (1) TABLET BY MOUTH AT BEDTIME.   ipratropium-albuterol 0.5-2.5 (3) MG/3ML Soln Commonly known as:  DUONEB Take 3 mLs by nebulization every 6 (six) hours as needed.   montelukast 10 MG tablet Commonly known as:  SINGULAIR TAKE (1) TABLET BY MOUTH AT BEDTIME.   pantoprazole 40 MG tablet Commonly known as:  PROTONIX TAKE 1 TABLET BY MOUTH ONCE A DAY.   potassium chloride SA 20 MEQ tablet Commonly known as:  K-DUR,KLOR-CON TAKE 1&1/2 TABLET BY MOUTH DAILY.   predniSONE 10 MG tablet Commonly known as:  DELTASONE Take daily by mouth: 20 mg x3 days, then 10 mg x3 days, then stop. Replaces:  predniSONE 5 MG (21) Tbpk tablet   repaglinide 1 MG tablet Commonly known as:  PRANDIN Take 1 tablet (1 mg total) by mouth 3 (three) times daily before meals.   rosuvastatin 40 MG tablet Commonly known as:  CRESTOR TAKE 1 TABLET BY MOUTH ONCE A DAY.   SPIRIVA HANDIHALER 18 MCG inhalation capsule Generic  drug:  tiotropium Place 18 mcg into inhaler and inhale daily.   spironolactone 25 MG tablet Commonly known as:  ALDACTONE TAKE 1 TABLET BY MOUTH ONCE A DAY.   SYMBICORT 80-4.5 MCG/ACT inhaler Generic drug:  budesonide-formoterol INHALE 2 PUFFS TWICE DAILY.   tizanidine 2 MG capsule Commonly known as:  ZANAFLEX Take 1 capsule (2 mg total) by mouth 2 (two) times daily.   TRADJENTA 5 MG Tabs tablet Generic drug:  linagliptin TAKE 1 TABLET BY MOUTH ONCE A DAY.   traMADol 50 MG tablet Commonly known as:  ULTRAM TAKE 1 TABLET BY MOUTH TWICE A DAY.   traZODone 50 MG tablet Commonly known as:  DESYREL TAKE (1) TABLET BY MOUTH AT BEDTIME.   Vitamin D (Ergocalciferol) 50000 units Caps capsule Commonly known as:  DRISDOL TAKE 1 CAPSULE BY MOUTH ONCE A WEEK.   ZETIA 10 MG  tablet Generic drug:  ezetimibe TAKE 1 TABLET BY MOUTH ONCE A DAY.      No Known Allergies  The results of significant diagnostics from this hospitalization (including imaging, microbiology, ancillary and laboratory) are listed below for reference.    Significant Diagnostic Studies: Dg Chest Port 1 View  Result Date: 05/25/2016 CLINICAL DATA:  50 year old female with shortness of breath EXAM: PORTABLE CHEST 1 VIEW COMPARISON:  Chest radiograph dated 04/03/2015 FINDINGS: Shallow inspiration with bibasilar subsegmental atelectatic changes. Pneumonia is less likely. Clinical correlation is recommended. No focal consolidation, pleural effusion, or pneumothorax. Mild cardiomegaly. No acute osseous pathology. IMPRESSION: No acute cardiopulmonary process. Mild cardiomegaly. Electronically Signed   By: Anner Crete M.D.   On: 05/25/2016 00:26  Labs: Basic Metabolic Panel:  Recent Labs Lab 05/24/16 2251 05/25/16 0625 05/27/16 0543 05/28/16 0517 05/29/16 0902  NA 140 136 139 138 138  K 3.8 4.5 4.2 4.1 3.8  CL 106 102 104 105 103  CO2 24 25 26 26 30   GLUCOSE 200* 445* 270* 175* 199*  BUN 15 17 46* 46* 40*  CREATININE 1.24* 1.40* 1.73* 1.67* 1.57*  CALCIUM 10.3 9.5 8.8* 8.5* 8.6*   Liver Function Tests:  Recent Labs Lab 05/25/16 0625  AST 23  ALT 21  ALKPHOS 85  BILITOT 0.6  PROT 6.9  ALBUMIN 3.5   CBC:  Recent Labs Lab 05/24/16 2251 05/25/16 0625 05/27/16 0543  WBC 9.0 7.5 9.5  HGB 15.1* 14.1 13.3  HCT 46.0 43.6 40.7  MCV 97.7 99.1 99.5  PLT 219 205 178     Recent Labs  05/24/16 2251  BNP 667.0*    CBG:  Recent Labs Lab 05/28/16 1142 05/28/16 1735 05/28/16 2121 05/29/16 0722 05/29/16 1200  GLUCAP 375* 504* 265* 157* 193*    Active Problems:   Diabetes mellitus type 2 in obese- uncontrolled   Morbid obesity (HCC)   HTN (hypertension), malignant   CKD (chronic kidney disease) stage 3, GFR 30-59 ml/min   Polysubstance abuse (including  cocaine)   Chronic pain syndrome   Hyperlipidemia LDL goal <70   Acute exacerbation of chronic obstructive pulmonary disease (COPD) (HCC)   Acute on chronic combined systolic (congestive) and diastolic (congestive) heart failure (HCC)   Anxiety and depression   Cardiomyopathy-etiology not yet determined (suspect hypertensive)   Time coordinating discharge: 35 minutes  Signed:  Murray Hodgkins, MD [facility] 05/29/2016, 5:24 PM   By signing my name below, I, Hilbert Odor, attest that this documentation has been prepared under the direction and in the presence of Colby Catanese P. Sarajane Jews, MD. Electronically signed: Hilbert Odor, Scribe.  05/29/16,   I personally performed the services described in this documentation. All medical record entries made by the scribe were at my direction. I have reviewed the chart and agree that the record reflects my personal performance and is accurate and complete. Murray Hodgkins, MD

## 2016-05-29 NOTE — Progress Notes (Signed)
PROGRESS NOTE  [name] [id number] DOB: 12-20-1965 DOA: 05/24/2016 PCP: Tula Nakayama, MD  Brief Narrative: 50 -year-old woman with history of diastolic congestive heart failure, COPD, smoker presented with shortness of breath and lower extremity edema. Treated for COPD exacerbation and CHF. Echocardiogram revealed new cardiomyopathy favored to be nonischemic in nature. Cardiology has evaluated the patient and plans for outpatient follow up.   Assessment/Plan: 1. Acute respiratory failure with hypoxia. Resolved. Secondary to COPD and CHF.  2. COPD exacerbation. Appears to be at baseline. 3. Acute systolic congestive heart failure, appears to be euvolemic at this time. New finding of diffuse hypokinesis thought secondary to noncompliance. Cardiology has recommended medication adjustments and will follow-up as an outpatient. 4. Acute kidney injury secondary to diuresis and CHF. Improving. 5. DM type 2. Hgb a1c 8.6. Blood sugars much improved today. 6. Essential HTN. Stable 7. Polysubstance abuse. UDS positive for benzodiazepines. 8. Tobacco use disorder. Counseled on the importance of cessation. 9. Morbid obesity.   Overall improved, appears euvolemic  Will be discharged with a Rx for prednisone and bisoprolol  Cardiology considering pursuing right and left heart catheterization with coronary angiography as an outpatient.  Murray Hodgkins, MD  [facility] Direct contact: [phone] --Via amion app OR  --[email or web address]; password [email or web address]  7PM-7AM contact night coverage as above 05/29/2016, 5:19 AM  LOS: 3 days   Consultants:  Cardiology  Procedures:  ECHO Study Conclusions  - Left ventricle: The cavity size was mildly dilated. Wall   thickness was increased in a pattern of moderate LVH. Systolic   function was severely reduced. The estimated ejection fraction   was in the range of 20% to 25%. Diffuse hypokinesis. Doppler   parameters are consistent with  restrictive physiology, indicative   of decreased left ventricular diastolic compliance and/or   increased left atrial pressure. - Aortic valve: There was trivial regurgitation. - Mitral valve: Calcified annulus. There was mild to moderate   regurgitation. - Left atrium: The atrium was moderately dilated. - Right ventricle: Systolic function was mildly to moderately   reduced. - Right atrium: Central venous pressure (est): 8 mm Hg. - Tricuspid valve: There was trivial regurgitation. - Pulmonary arteries: PA peak pressure: 35 mm Hg (S). - Pericardium, extracardiac: There was no pericardial effusion.  Antimicrobials:  none  HPI/Subjective: Pt is doing well today. Breathing well. Ambulating well. Eating better.  Objective: Vitals:   05/28/16 1202 05/28/16 1624 05/28/16 2030 05/28/16 2141  BP:    132/84  Pulse:   78 80  Resp:   18 20  Temp:    98.2 F (36.8 C)  TempSrc:    Oral  SpO2: 92% 99% 96% 95%  Weight:      Height:        Intake/Output Summary (Last 24 hours) at 05/29/16 0519 Last data filed at 05/28/16 1850  Gross per 24 hour  Intake              840 ml  Output              750 ml  Net               90 ml     Filed Weights   05/24/16 2243 05/28/16 1023  Weight: 84.4 kg (186 lb) 89.5 kg (197 lb 6.4 oz)   Exam   Constitutional:  . Appears calm and comfortable Respiratory:  . CTA bilaterally, no w/r/r.  . Respiratory effort normal. No retractions or accessory muscle use Cardiovascular:  .  RRR, no m/r/g . No LE extremity edema   Psychiatric:  . Mental status o Mood, affect appropriate    I have personally reviewed following labs and imaging studies:  BUN 40, Cr 1.57, stable  Blood sugar high last night, subsequently improved  Scheduled Meds: . antiseptic oral rinse  7 mL Mouth Rinse BID  . azelastine  2 spray Each Nare BID  . benazepril  40 mg Oral Daily  . bisoprolol  15 mg Oral Daily  . clopidogrel  75 mg Oral Q breakfast  . enoxaparin  (LOVENOX) injection  40 mg Subcutaneous Q24H  . FLUoxetine  20 mg Oral Daily  . fluticasone  2 spray Each Nare Daily  . furosemide  40 mg Intravenous Daily  . gabapentin  100 mg Oral TID  . guaiFENesin  1,200 mg Oral BID  . insulin aspart  0-20 Units Subcutaneous TID WC  . insulin aspart  0-5 Units Subcutaneous QHS  . ipratropium-albuterol  3 mL Nebulization QID  . mometasone-formoterol  2 puff Inhalation BID  . montelukast  10 mg Oral QHS  . pantoprazole  40 mg Oral Daily  . potassium chloride SA  20 mEq Oral Daily  . predniSONE  30 mg Oral Q breakfast  . rosuvastatin  40 mg Oral Daily  . sodium chloride flush  3 mL Intravenous Q12H  . spironolactone  12.5 mg Oral Daily  . tiZANidine  2 mg Oral BID  . traZODone  50 mg Oral QHS  . varenicline  0.5 mg Oral BID   Continuous Infusions:   Active Problems:   Diabetes mellitus type 2 in obese- uncontrolled   Morbid obesity ([place])   HTN (hypertension), malignant   CKD (chronic kidney disease) stage 3, GFR 30-59 ml/min   Polysubstance abuse (including cocaine)   Chronic pain syndrome   Hyperlipidemia LDL goal <70   Acute exacerbation of chronic obstructive pulmonary disease (COPD) (HCC)   Acute on chronic combined systolic (congestive) and diastolic (congestive) heart failure (HCC)   Anxiety and depression   Cardiomyopathy-etiology not yet determined (suspect hypertensive)   LOS: 3 days     By signing my name below, I, Hilbert Odor, attest that this documentation has been prepared under the direction and in the presence of [place]. Sarajane Jews, MD. Electronically signed: Hilbert Odor, Scribe.  05/29/16, 12:32 PM   I personally performed the services described in this documentation. All medical record entries made by the scribe were at my direction. I have reviewed the chart and agree that the record reflects my personal performance and is accurate and complete. Murray Hodgkins, MD

## 2016-05-29 NOTE — Care Management Important Message (Signed)
Important Message  Patient Details  Name: [name] MRN: [id number] Date of Birth: 1966/03/05   Medicare Important Message Given:  Yes    Keegan Bensch, Chauncey [place], RN 05/29/2016, 1:05 PM

## 2016-05-29 NOTE — Care Management Note (Signed)
Case Management Note  Patient Details  Name: [name] MRN: [id number] Date of Birth: Jun 25, 1966   Additional Comments: Patient no longer requires oxygen, will cancel home oxygen orders. Romualdo Bolk of [facility] notified. Patient will DC home with self care.  Claritza July, Chauncey Reading, RN 05/29/2016, 12:57 PM

## 2016-06-01 ENCOUNTER — Encounter: Payer: Self-pay | Admitting: *Deleted

## 2016-06-01 ENCOUNTER — Other Ambulatory Visit: Payer: Self-pay | Admitting: *Deleted

## 2016-06-01 NOTE — Patient Outreach (Signed)
Call to [place]. Spoke with Vicente Males, she was able to review medications. She states they have discharge papers and new prescriptions. She has note for patient on which medications to return to pharmacy. Vicente Males states they work closely with patient on medication management to ensure compliance and no doubling up of medications. Plan to continue transition of care calls with patient,  Will follow up with pharmacy [facility]) as needed. Of note, referral made to [place] for medication management. Royetta Crochet. Laymond Purser, RN, BSN, [place] [phone]

## 2016-06-01 NOTE — Patient Outreach (Signed)
Call received from patient in response to message left. Patient agrees to [facility] program calls   Subjective: Patient able to report she is doing much better since her hospitalization last week. Patient states her breathing is 100% better, she is thankful for the rain today. Patient reports that she gave her new prescriptions and her discharge instructions to [place] and they will deliver her new medications today. Patient states they work with her on her medication management. Patient reports she has a CAP aide who is there M-F 9a-4p, who assists her with ADL's and IADL's. Patient reports no Home Health was ordered. She needs to make follow up appointments to see primary care and pulmonologist.   Assessment: COPD: recent hospitalization for COPD, needs education on disease management Diabetes: A1C is 8.6, needs education on diabetes management  Plan: Patient to call and make follow up appointments with primary care and pulmonologist Call Rx Care regarding medications  Reminded patient of referral to [facility] program Pharmacist and expect outreach call Verified patient has RNCM contact for future reference. Continue transition of care program calls, hope for HV next week.  Royetta Crochet. Laymond Purser, RN, BSN, [place] [phone]

## 2016-06-01 NOTE — Patient Outreach (Signed)
Call attempted to patient in regard to referral to [place]. No answer, left VM requesting call back with RNCM contact. Noted that patient was inpatient last week. Will place patient in Transition of care program. Royetta Crochet. Laymond Purser, RN, BSN, [place] [phone]

## 2016-06-02 ENCOUNTER — Ambulatory Visit: Payer: Self-pay | Admitting: *Deleted

## 2016-06-05 ENCOUNTER — Other Ambulatory Visit: Payer: Self-pay | Admitting: Family Medicine

## 2016-06-05 DIAGNOSIS — E669 Obesity, unspecified: Principal | ICD-10-CM

## 2016-06-05 DIAGNOSIS — E1169 Type 2 diabetes mellitus with other specified complication: Secondary | ICD-10-CM

## 2016-06-08 ENCOUNTER — Other Ambulatory Visit: Payer: Self-pay | Admitting: *Deleted

## 2016-06-08 NOTE — Patient Outreach (Signed)
Transition of care call attempted. No answer, left message. Plan to attempt call again later. Royetta Crochet. Laymond Purser, RN, BSN, [place] [phone]

## 2016-06-10 DIAGNOSIS — J441 Chronic obstructive pulmonary disease with (acute) exacerbation: Secondary | ICD-10-CM | POA: Diagnosis not present

## 2016-06-11 ENCOUNTER — Other Ambulatory Visit: Payer: Self-pay | Admitting: *Deleted

## 2016-06-11 ENCOUNTER — Other Ambulatory Visit: Payer: Self-pay | Admitting: Pharmacist

## 2016-06-11 NOTE — Patient Outreach (Signed)
Transition of care call. Call to patient. Patient states she is doing good this week, no new issues to report. She has her medications, She has a medical appointment next week. Patient agrees to [facility] next week.   Plan: [facility] CM Care Plan Problem One   Flowsheet Row Most Recent Value  Care Plan Problem One  knowledge deficit related to COPD as evidenced by patient verbalizing need for additional education around COPD zones  Role Documenting the Problem One  Care Management Coordinator  Care Plan for Problem One  Active  THN Long Term Goal (31-90 days)  Patient will not have any COPD related hospitalizations over the next 31 days  THN Long Term Goal Start Date  06/01/16  THN CM Short Term Goal #1 (0-30 days)  Patient will be able to verbalize importance of learning and using COPD zones for self monitoring  THN CM Short Term Goal #1 Start Date  06/01/16  THN CM Short Term Goal #2 (0-30 days)  Patient will be able to self monitor for COPD symptoms and seek treatment for yellow zone symptoms over the next 21 days  THN CM Short Term Goal #2 Start Date  06/01/16  Interventions for Short Term Goal #2  reviewed symptoms during call     Plan for home visit next week. COPD management and education.   Royetta Crochet. Laymond Purser, RN, BSN, [place] [phone]

## 2016-06-11 NOTE — Patient Outreach (Signed)
[facility]) Care Management  [place]   06/11/2016  NITARA SCHABEL 1966-03-23 [id number]  Subjective:  Ms Stahr was referred to [place] by Sherrin Daisy, [facility] RN Telephonic for medication review.  Patient is also active with Burgess Amor, [facility] RN [facility].   Phone call was placed to patient 06/11/16 and patient verified HIPAA details.  Explained purpose of call was to review patient's medications with her.  Patient stated she wasn't interested in reviewing medications over the phone or a pharmacy home visit as she states she gets all of her medications from [facility] and they help her get her refills.     Given patient was discharged from hospital 05/29/16, a medication review was done by reviewing hospital discharge summary.    Objective:   Current Medications: Current Outpatient Prescriptions  Medication Sig Dispense Refill  . albuterol (PROVENTIL HFA;VENTOLIN HFA) 108 (90 Base) MCG/ACT inhaler Inhale 2 puffs into the lungs every 6 (six) hours as needed for wheezing or shortness of breath. 18 g 0  . azelastine (ASTELIN) 0.1 % nasal spray USE 2 SPRAYS IN EACH NOSTRIL TWICE DAILY. 30 mL 5  . benazepril (LOTENSIN) 40 MG tablet TAKE 1 TABLET BY MOUTH ONCE A DAY. 30 tablet 2  . bisoprolol (ZEBETA) 5 MG tablet TAKE 3 TABLETS (15mg ) BY MOUTH ONCE DAILY. 90 tablet 2  . CHANTIX STARTING MONTH PAK 0.5 MG X 11 & 1 MG X 42 tablet Take 0.5 mg by mouth 2 (two) times daily.     . Choline Fenofibrate 135 MG capsule Take 1 capsule (135 mg total) by mouth daily. 30 capsule 4  . clopidogrel (PLAVIX) 75 MG tablet TAKE 1 TABLET BY MOUTH DAILY WITH BREAKFAST. 30 tablet 2  . FLUoxetine (PROZAC) 20 MG capsule TAKE 1 CAPSULE BY MOUTH EVERY DAY. 30 capsule 2  . fluticasone (FLONASE) 50 MCG/ACT nasal spray USE 2 SPRAYS IN EACH NOSTRIL DAILY. 16 g 2  . furosemide (LASIX) 40 MG tablet TAKE 1 TABLET BY MOUTH ONCE A DAY. 30 tablet 2  . gabapentin  (NEURONTIN) 100 MG capsule TAKE 1 CAPSULE BY MOUTH THREE TIMES A DAY. 90 capsule 2  . hydrOXYzine (ATARAX/VISTARIL) 50 MG tablet TAKE (1) TABLET BY MOUTH AT BEDTIME. 30 tablet 3  . ipratropium-albuterol (DUONEB) 0.5-2.5 (3) MG/3ML SOLN Take 3 mLs by nebulization every 6 (six) hours as needed. 360 mL 1  . montelukast (SINGULAIR) 10 MG tablet TAKE (1) TABLET BY MOUTH AT BEDTIME. 30 tablet 2  . pantoprazole (PROTONIX) 40 MG tablet TAKE 1 TABLET BY MOUTH ONCE A DAY. 30 tablet PRN  . potassium chloride SA (K-DUR,KLOR-CON) 20 MEQ tablet TAKE 1&1/2 TABLET BY MOUTH DAILY. 45 tablet 2  . predniSONE (DELTASONE) 10 MG tablet Take daily by mouth: 20 mg x3 days, then 10 mg x3 days, then stop. 9 tablet 0  . repaglinide (PRANDIN) 1 MG tablet TAKE 1 TABLET BY MOUTH 3 TIMES DAILY BEFORE MEALS. 90 tablet 2  . rosuvastatin (CRESTOR) 40 MG tablet TAKE 1 TABLET BY MOUTH ONCE A DAY. 30 tablet 3  . SPIRIVA HANDIHALER 18 MCG inhalation capsule Place 18 mcg into inhaler and inhale daily.     Marland Kitchen spironolactone (ALDACTONE) 25 MG tablet TAKE 1 TABLET BY MOUTH ONCE A DAY. 30 tablet PRN  . SYMBICORT 80-4.5 MCG/ACT inhaler INHALE 2 PUFFS TWICE DAILY. 10.2 g 2  . tizanidine (ZANAFLEX) 2 MG capsule Take 1 capsule (2 mg total) by mouth 2 (two)  times daily. 60 capsule 4  . TRADJENTA 5 MG TABS tablet TAKE 1 TABLET BY MOUTH ONCE A DAY. 30 tablet 2  . traMADol (ULTRAM) 50 MG tablet TAKE 1 TABLET BY MOUTH TWICE A DAY. 60 tablet 2  . traZODone (DESYREL) 50 MG tablet TAKE (1) TABLET BY MOUTH AT BEDTIME. 30 tablet 2  . Vitamin D, Ergocalciferol, (DRISDOL) 50000 units CAPS capsule TAKE 1 CAPSULE BY MOUTH ONCE A WEEK. 4 capsule 2  . ZETIA 10 MG tablet TAKE 1 TABLET BY MOUTH ONCE A DAY. 30 tablet 2   No current facility-administered medications for this visit.     Functional Status: In your present state of health, do you have any difficulty performing the following activities: 06/01/2016 05/25/2016  Hearing? N N  Vision? N N  Difficulty  concentrating or making decisions? N N  Walking or climbing stairs? N N  Dressing or bathing? Y N  Doing errands, shopping? Y N  Preparing Food and eating ? Y -  Using the Toilet? N -  In the past six months, have you accidently leaked urine? N -  Do you have problems with loss of bowel control? N -  Managing your Medications? Y -  Managing your Finances? Y -  Housekeeping or managing your Housekeeping? Y -  Some recent data might be hidden    Fall/Depression Screening: PHQ 2/9 Scores 09/03/2015 05/24/2014 10/30/2013 11/01/2012  PHQ - 2 Score 1 2 6 6   PHQ- 9 Score 7 10 24 24     Assessment:  Drugs sorted by system:  Neurologic/Psychologic: -fluoxetine -gabapentin  Cardiovascular: -benazepril -bisoprolol---new medication on discharge -choline fenofibrate  -clopidogrel -ezetimibe -furosemide -potassium chloride  -rosuvastatin  -spironolactone   Pulmonary/Allergy: -albuterol inhaler -azelastine nasal spray -budesonide/formoterol (Symbicort)  -fluticasone nasal spray -ipratropium/albuterol nebs -montelukast -tiotropium (Spiriva)   Gastrointestinal: -pantoprazole   Endocrine: -linagliptin (Tradjenta)  -repaglinide  Pain: -tramadol  Vitamins/Minerals: -ergocalciferol   Miscellaneous: -hydroxyzine  -prednisone -tizanidine -trazodone  -varenicline (Chantix)   Other issues noted:  The following medications were discontinued on hospital discharge: -metoprolol tartrate---replaced with bisoprolol -metronidazole -triamterene/hydrochlorothiazide  Suggest close monitoring of patient's renal function and potassium given she is on furosemide, spironolactone, benazepril, and oral potassium chloride.    Plan:  Given patient denies offer of [place] home visit, will not open a pharmacy case.    Spoke with [facility] RN, Stanton Kidney on the phone about patient's refusal of [place] home visit.   Will route note with medication review following discharge to patient's  PCP.    Karrie Meres, PharmD, [place] [phone]

## 2016-06-12 DIAGNOSIS — I509 Heart failure, unspecified: Secondary | ICD-10-CM | POA: Diagnosis not present

## 2016-06-17 ENCOUNTER — Encounter: Payer: Self-pay | Admitting: Cardiovascular Disease

## 2016-06-17 ENCOUNTER — Ambulatory Visit (INDEPENDENT_AMBULATORY_CARE_PROVIDER_SITE_OTHER): Payer: Medicare Other | Admitting: Physician Assistant

## 2016-06-17 ENCOUNTER — Encounter: Payer: Self-pay | Admitting: Physician Assistant

## 2016-06-17 VITALS — BP 144/98 | HR 81 | Ht 61.0 in | Wt 188.0 lb

## 2016-06-17 DIAGNOSIS — I429 Cardiomyopathy, unspecified: Secondary | ICD-10-CM | POA: Diagnosis not present

## 2016-06-17 DIAGNOSIS — N183 Chronic kidney disease, stage 3 unspecified: Secondary | ICD-10-CM

## 2016-06-17 DIAGNOSIS — Z7409 Other reduced mobility: Secondary | ICD-10-CM | POA: Diagnosis not present

## 2016-06-17 DIAGNOSIS — I10 Essential (primary) hypertension: Secondary | ICD-10-CM

## 2016-06-17 LAB — BASIC METABOLIC PANEL
BUN: 23 mg/dL (ref 7–25)
CO2: 20 mmol/L (ref 20–31)
Calcium: 9.7 mg/dL (ref 8.6–10.4)
Chloride: 108 mmol/L (ref 98–110)
Creat: 1.81 mg/dL — ABNORMAL HIGH (ref 0.50–1.05)
Glucose, Bld: 281 mg/dL — ABNORMAL HIGH (ref 65–99)
Potassium: 5 mmol/L (ref 3.5–5.3)
Sodium: 138 mmol/L (ref 135–146)

## 2016-06-17 MED ORDER — SPIRONOLACTONE 25 MG PO TABS: ORAL_TABLET | ORAL | 6 refills | Status: DC

## 2016-06-17 MED ORDER — SPIRONOLACTONE 25 MG PO TABS
25.0000 mg | ORAL_TABLET | Freq: Two times a day (BID) | ORAL | 3 refills | Status: DC
Start: 2016-06-17 — End: 2016-06-17

## 2016-06-17 MED ORDER — SPIRONOLACTONE 25 MG PO TABS: 25.0000 mg | ORAL_TABLET | Freq: Two times a day (BID) | ORAL | 11 refills | Status: DC

## 2016-06-17 NOTE — Patient Instructions (Addendum)
Your physician recommends that you schedule a follow-up appointment in: 1 Month with Dr. Bronson Ing.   Your physician has recommended you make the following change in your medication: Increase Spironolactone to 25 mg Two time daily   Your physician recommends that you return for lab work in: Today  If you need a refill on your cardiac medications before your next appointment, please call your pharmacy.  Thank you for choosing [facility]!

## 2016-06-17 NOTE — Progress Notes (Signed)
Cardiology Office Note    Date:  06/17/2016   ID:  [name], DOB 02-28-1966, MRN [id number]  PCP:  Tula Nakayama, MD  Cardiologist: Dr. Bronson Ing  Chief Complaint  Patient presents with  . Follow-up    History of Present Illness:  [name] is a 50 y.o. female  with a history of hypertensive cardiomyopathy complicated by uncontrolled hypertension, DM, and CKD-3. She has had an EF of 45% in the past. Admitted 05/24/16 with COPD exacerbation and CHF. EF now 25%. Associated with restrictive diastolic filling pattern also associated mild to moderate RV dysfunction. At this point suspected nonischemic etiology in light of history of uncontrolled hypertension and previous hypertensive cardiomyopathy. She denied any recent substance abuse but does have a history in the past. She diuresed in the hospital and plan was to stabilize on medications and then recommend right and left heart catheterization as an outpatient.  Patient comes in today for post hospital follow-up. She says her breathing is much better. She still is very weak and fatigued and has trouble doing things. She would like a motorized scooter to help her get around. She denies any chest pain, orthopnea, edema. She weighs herself daily. She [name] she quit smoking 2 weeks ago.       Past Medical History:  Diagnosis Date  . Alcohol use ([place])    Now abstinent.  . Ankle fracture, lateral malleolus, closed 12/30/2011  . Anxiety   . Breast mass, left 12/15/2011  . CKD (chronic kidney disease)   . COPD (chronic obstructive pulmonary disease) ([place])   . Diabetes mellitus, type 2 ([place])   . Diabetic Charcot foot ([place]) 12/30/2011  . Essential hypertension   . History of GI bleed 12/05/2009   Qualifier: Diagnosis of  By: Zeb Comfort    . Hyperlipidemia   . Hypertensive cardiomyopathy ([place]) 11/08/2012   Ejection fraction 40-45%.  . Noncompliance with medication regimen   . Obesity   . Panic attacks   . Tobacco  abuse     Past Surgical History:  Procedure Laterality Date  . BREAST BIOPSY    . DILATION AND CURETTAGE OF UTERUS      Current Medications: Outpatient Medications Prior to Visit  Medication Sig Dispense Refill  . albuterol (PROVENTIL HFA;VENTOLIN HFA) 108 (90 Base) MCG/ACT inhaler Inhale 2 puffs into the lungs every 6 (six) hours as needed for wheezing or shortness of breath. 18 g 0  . azelastine (ASTELIN) 0.1 % nasal spray USE 2 SPRAYS IN EACH NOSTRIL TWICE DAILY. 30 mL 5  . benazepril (LOTENSIN) 40 MG tablet TAKE 1 TABLET BY MOUTH ONCE A DAY. 30 tablet 2  . bisoprolol (ZEBETA) 5 MG tablet TAKE 3 TABLETS (15mg ) BY MOUTH ONCE DAILY. 90 tablet 2  . CHANTIX STARTING MONTH PAK 0.5 MG X 11 & 1 MG X 42 tablet Take 0.5 mg by mouth 2 (two) times daily.     . Choline Fenofibrate 135 MG capsule Take 1 capsule (135 mg total) by mouth daily. 30 capsule 4  . clopidogrel (PLAVIX) 75 MG tablet TAKE 1 TABLET BY MOUTH DAILY WITH BREAKFAST. 30 tablet 2  . FLUoxetine (PROZAC) 20 MG capsule TAKE 1 CAPSULE BY MOUTH EVERY DAY. 30 capsule 2  . fluticasone (FLONASE) 50 MCG/ACT nasal spray USE 2 SPRAYS IN EACH NOSTRIL DAILY. 16 g 2  . furosemide (LASIX) 40 MG tablet TAKE 1 TABLET BY MOUTH ONCE A DAY. 30 tablet 2  . gabapentin (NEURONTIN) 100 MG capsule TAKE  1 CAPSULE BY MOUTH THREE TIMES A DAY. 90 capsule 2  . hydrOXYzine (ATARAX/VISTARIL) 50 MG tablet TAKE (1) TABLET BY MOUTH AT BEDTIME. 30 tablet 3  . ipratropium-albuterol (DUONEB) 0.5-2.5 (3) MG/3ML SOLN Take 3 mLs by nebulization every 6 (six) hours as needed. 360 mL 1  . montelukast (SINGULAIR) 10 MG tablet TAKE (1) TABLET BY MOUTH AT BEDTIME. 30 tablet 2  . pantoprazole (PROTONIX) 40 MG tablet TAKE 1 TABLET BY MOUTH ONCE A DAY. 30 tablet PRN  . potassium chloride SA (K-DUR,KLOR-CON) 20 MEQ tablet TAKE 1&1/2 TABLET BY MOUTH DAILY. 45 tablet 2  . predniSONE (DELTASONE) 10 MG tablet Take daily by mouth: 20 mg x3 days, then 10 mg x3 days, then stop. 9 tablet  0  . repaglinide (PRANDIN) 1 MG tablet TAKE 1 TABLET BY MOUTH 3 TIMES DAILY BEFORE MEALS. 90 tablet 2  . rosuvastatin (CRESTOR) 40 MG tablet TAKE 1 TABLET BY MOUTH ONCE A DAY. 30 tablet 3  . SPIRIVA HANDIHALER 18 MCG inhalation capsule Place 18 mcg into inhaler and inhale daily.     . SYMBICORT 80-4.5 MCG/ACT inhaler INHALE 2 PUFFS TWICE DAILY. 10.2 g 2  . tizanidine (ZANAFLEX) 2 MG capsule Take 1 capsule (2 mg total) by mouth 2 (two) times daily. 60 capsule 4  . TRADJENTA 5 MG TABS tablet TAKE 1 TABLET BY MOUTH ONCE A DAY. 30 tablet 2  . traMADol (ULTRAM) 50 MG tablet TAKE 1 TABLET BY MOUTH TWICE A DAY. 60 tablet 2  . traZODone (DESYREL) 50 MG tablet TAKE (1) TABLET BY MOUTH AT BEDTIME. 30 tablet 2  . Vitamin D, Ergocalciferol, (DRISDOL) 50000 units CAPS capsule TAKE 1 CAPSULE BY MOUTH ONCE A WEEK. 4 capsule 2  . ZETIA 10 MG tablet TAKE 1 TABLET BY MOUTH ONCE A DAY. 30 tablet 2  . spironolactone (ALDACTONE) 25 MG tablet TAKE 1 TABLET BY MOUTH ONCE A DAY. 30 tablet PRN   No facility-administered medications prior to visit.      Allergies:   Review of patient's allergies indicates no known allergies.   Social History   Social History  . Marital status: Widowed    Spouse name: N/A  . Number of children: 2  . Years of education: N/A   Social History Main Topics  . Smoking status: Former Smoker    Packs/day: 1.00    Years: 30.00    Types: Cigarettes    Quit date: 06/03/2016  . Smokeless tobacco: Never Used  . Alcohol use 0.0 oz/week     Comment: wine and mickeys   . Drug use:     Types: "Crack" cocaine     Comment: crack  . Sexual activity: Yes    Birth control/ protection: None   Other Topics Concern  . None   Social History Narrative  . None     Family History:  The patient's   family history includes Cancer in her sister; Heart attack in her mother; Hypertension in her brother, father, mother, sister, and sister.   ROS:   Please see the history of present illness.      Review of Systems  Constitution: Positive for weakness and malaise/fatigue.  HENT: Negative.   Eyes: Negative.   Cardiovascular: Positive for dyspnea on exertion.  Respiratory: Negative.   Hematologic/Lymphatic: Negative.   Musculoskeletal: Positive for muscle weakness. Negative for joint pain.  Gastrointestinal: Negative.   Genitourinary: Negative.    All other systems reviewed and are negative.   PHYSICAL EXAM:  VS:  BP (!) 144/98   Pulse 81   Ht 5\' 1"  (1.549 m)   Wt 188 lb (85.3 kg)   LMP 04/24/2016   SpO2 95%   BMI 35.52 kg/m   Physical Exam  GEN: Obese, in no acute distress  Neck: no JVD, carotid bruits, or masses Cardiac:RRR; distant heart sounds, no murmurs, rubs, or gallops  Respiratory:  Decreased breath sounds but clear to auscultation bilaterally, normal work of breathing GI: soft, nontender, nondistended, + BS Ext: without cyanosis, clubbing, or edema, Good distal pulses bilaterally MS: no deformity or atrophy  Skin: warm and dry, no rash Psych: euthymic mood, full affect  Wt Readings from Last 3 Encounters:  06/17/16 188 lb (85.3 kg)  05/29/16 196 lb 10.8 oz (89.2 kg)  05/07/16 196 lb 1.9 oz (89 kg)      Studies/Labs Reviewed:   EKG:  EKG is not ordered today.    Recent Labs: 02/28/2016: TSH 1.49 05/24/2016: B Natriuretic Peptide 667.0 05/25/2016: ALT 21 05/27/2016: Hemoglobin 13.3; Platelets 178 05/29/2016: BUN 40; Creatinine, Ser 1.57; Potassium 3.8; Sodium 138   Lipid Panel    Component Value Date/Time   CHOL 251 (H) 02/28/2016 1235   TRIG 300 (H) 02/28/2016 1235   HDL 62 02/28/2016 1235   CHOLHDL 4.0 02/28/2016 1235   VLDL 60 (H) 02/28/2016 1235   LDLCALC 129 02/28/2016 1235   LDLDIRECT 122 (H) 01/08/2010 0818    Additional studies/ records that were reviewed today include:  Cardiac Studies: Echo 05/26/16 Study Conclusions   - Left ventricle: The cavity size was mildly dilated. Wall   thickness was increased in a pattern of moderate LVH.  Systolic   function was severely reduced. The estimated ejection fraction   was in the range of 20% to 25%. Diffuse hypokinesis. Doppler   parameters are consistent with restrictive physiology, indicative   of decreased left ventricular diastolic compliance and/or   increased left atrial pressure. - Aortic valve: There was trivial regurgitation. - Mitral valve: Calcified annulus. There was mild to moderate   regurgitation. - Left atrium: The atrium was moderately dilated. - Right ventricle: Systolic function was mildly to moderately   reduced. - Right atrium: Central venous pressure (est): 8 mm Hg. - Tricuspid valve: There was trivial regurgitation. - Pulmonary arteries: PA peak pressure: 35 mm Hg (S). - Pericardium, extracardiac: There was no pericardial effusion.   Impressions:   - Mild LV chamber dilatation with moderate LVH and LVEF 20-25%.   There is diffuse hypokinesis noted with restrictive diastolic   filling pattern. Moderate left atrial enlargement. Mild MAC with   mild to moderate mitral regurgitation. Trivial aortic   regurgitation. Mild to moderate reduction in RV contraction.   Trivial tricuspid regurgitation with PASP 35 mmHg.       ASSESSMENT:    1. Essential hypertension   2. Cardiomyopathy-etiology not yet determined (suspect hypertensive)   3. CKD (chronic kidney disease) stage 3, GFR 30-59 ml/min   4. Poor mobility      PLAN:  In order of problems listed above:  Hypertension patient's blood pressure is elevated today will increase spironolactone to 25 mg twice a day  Cardiomyopathy EF now 20-25% on echo in the hospital. Presumed hypertensive cardiomyopathy but have not ruled out ischemic cardiomyopathy. To follow-up with Dr. Bronson Ing to determine if R/L heart cath should be scheduled   CKD: check renal function today  Poor mobility patient is having trouble getting around because she is so weak and dyspnea  on exertion. She is requesting a  motorized scooter which given her a prescription for.   Medication Adjustments/Labs and Tests Ordered: Current medicines are reviewed at length with the patient today.  Concerns regarding medicines are outlined above.  Medication changes, Labs and Tests ordered today are listed in the Patient Instructions below. Patient Instructions  Your physician recommends that you schedule a follow-up appointment in: 1 Month with Dr. Bronson Ing.   Your physician recommends that you continue on your current medications as directed. Please refer to the Current Medication list given to you today.  Your physician recommends that you return for lab work in: Today  If you need a refill on your cardiac medications before your next appointment, please call your pharmacy.  Thank you for choosing [facility]!        Sumner Boast, PA-C  06/17/2016 12:25 PM    [facility] [address], [place], [place]  [address] Phone: [phone]; Fax: [phone]

## 2016-06-18 ENCOUNTER — Encounter: Payer: Self-pay | Admitting: *Deleted

## 2016-06-18 ENCOUNTER — Other Ambulatory Visit: Payer: Self-pay | Admitting: *Deleted

## 2016-06-18 NOTE — Patient Outreach (Signed)
[facility] Care Management   06/18/2016  BONNI MOORS 25-Dec-1965 [id number]  [name] is an 50 y.o. female  Subjective:  Patient reports she saw her cardiologist yesterday, some medications were changed, she will have [facility] pick up medications and repackage. Patient states her daughter is her caregiver and provides transportation for her.  Patient has new rx for scooter, states she gets very short of breath with ambulation, she is not on oxygen.  Patient is enrolled in CAP, her daughter is caregiver. Patient is enrolled in Mobile meals.  Patient would like to move out of her present situation into an apartment.   Patient very active in her church and verbalizes a lot of faith around her illness.  Patient does smoke, states she is down to 2-3 a day and hopes to quit altogether soon, she is taking Chantix, without any side effects.  Objective:   Patient neatly groomed. Home clean but cluttered, lives in one bedroom motel room. BP 122/68   Pulse 80   Resp 20   Ht 1.549 m (5\' 1" )   Wt 188 lb (85.3 kg)   LMP 04/24/2016   SpO2 96%   BMI 35.52 kg/m  Review of Systems  Constitutional: Negative.   HENT: Negative.   Eyes: Negative.   Respiratory: Positive for shortness of breath.   Cardiovascular: Negative for chest pain and leg swelling.  Gastrointestinal: Negative.   Genitourinary: Negative.   Musculoskeletal: Positive for back pain.  Skin: Negative.   Neurological: Negative.   Endo/Heme/Allergies: Negative.   Psychiatric/Behavioral: Negative.     Physical Exam  Constitutional: She is oriented to person, place, and time. She appears well-developed and well-nourished.  Cardiovascular: Normal rate and regular rhythm.   Respiratory: Effort normal and breath sounds normal.  GI: Soft. Bowel sounds are normal.  Musculoskeletal: Normal range of motion.  Neurological: She is alert and oriented to person, place, and time.  Skin: Skin is warm and  dry.    Encounter Medications:   Outpatient Encounter Prescriptions as of 06/18/2016  Medication Sig Note  . albuterol (PROVENTIL HFA;VENTOLIN HFA) 108 (90 Base) MCG/ACT inhaler Inhale 2 puffs into the lungs every 6 (six) hours as needed for wheezing or shortness of breath.   Marland Kitchen azelastine (ASTELIN) 0.1 % nasal spray USE 2 SPRAYS IN EACH NOSTRIL TWICE DAILY.   . benazepril (LOTENSIN) 40 MG tablet TAKE 1 TABLET BY MOUTH ONCE A DAY.   . bisoprolol (ZEBETA) 5 MG tablet TAKE 3 TABLETS (15mg ) BY MOUTH ONCE DAILY.   Marland Kitchen CHANTIX STARTING MONTH PAK 0.5 MG X 11 & 1 MG X 42 tablet Take 0.5 mg by mouth 2 (two) times daily.    . Choline Fenofibrate 135 MG capsule Take 1 capsule (135 mg total) by mouth daily.   . clopidogrel (PLAVIX) 75 MG tablet TAKE 1 TABLET BY MOUTH DAILY WITH BREAKFAST.   Marland Kitchen FLUoxetine (PROZAC) 20 MG capsule TAKE 1 CAPSULE BY MOUTH EVERY DAY.   . fluticasone (FLONASE) 50 MCG/ACT nasal spray USE 2 SPRAYS IN EACH NOSTRIL DAILY.   . furosemide (LASIX) 40 MG tablet TAKE 1 TABLET BY MOUTH ONCE A DAY.   Marland Kitchen gabapentin (NEURONTIN) 100 MG capsule TAKE 1 CAPSULE BY MOUTH THREE TIMES A DAY.   . hydrOXYzine (ATARAX/VISTARIL) 50 MG tablet TAKE (1) TABLET BY MOUTH AT BEDTIME.   Marland Kitchen ipratropium-albuterol (DUONEB) 0.5-2.5 (3) MG/3ML SOLN Take 3 mLs by nebulization every 6 (six) hours as needed.   . montelukast (SINGULAIR) 10 MG  tablet TAKE (1) TABLET BY MOUTH AT BEDTIME.   . pantoprazole (PROTONIX) 40 MG tablet TAKE 1 TABLET BY MOUTH ONCE A DAY.   Marland Kitchen potassium chloride SA (K-DUR,KLOR-CON) 20 MEQ tablet TAKE 1&1/2 TABLET BY MOUTH DAILY.   Marland Kitchen predniSONE (DELTASONE) 10 MG tablet Take daily by mouth: 20 mg x3 days, then 10 mg x3 days, then stop.   . repaglinide (PRANDIN) 1 MG tablet TAKE 1 TABLET BY MOUTH 3 TIMES DAILY BEFORE MEALS.   . rosuvastatin (CRESTOR) 40 MG tablet TAKE 1 TABLET BY MOUTH ONCE A DAY.   Marland Kitchen SPIRIVA HANDIHALER 18 MCG inhalation capsule Place 18 mcg into inhaler and inhale daily.    Marland Kitchen  spironolactone (ALDACTONE) 25 MG tablet Take One Tablet Two Times Daily 06/18/2016: [facility] to pick up and refill  . SYMBICORT 80-4.5 MCG/ACT inhaler INHALE 2 PUFFS TWICE DAILY.   . tizanidine (ZANAFLEX) 2 MG capsule Take 1 capsule (2 mg total) by mouth 2 (two) times daily. 06/01/2016: Has a prn dose, not scheduled  . TRADJENTA 5 MG TABS tablet TAKE 1 TABLET BY MOUTH ONCE A DAY.   . traMADol (ULTRAM) 50 MG tablet TAKE 1 TABLET BY MOUTH TWICE A DAY.   . traZODone (DESYREL) 50 MG tablet TAKE (1) TABLET BY MOUTH AT BEDTIME.   Marland Kitchen Vitamin D, Ergocalciferol, (DRISDOL) 50000 units CAPS capsule TAKE 1 CAPSULE BY MOUTH ONCE A WEEK.   Marland Kitchen ZETIA 10 MG tablet TAKE 1 TABLET BY MOUTH ONCE A DAY.    No facility-administered encounter medications on file as of 06/18/2016.     Functional Status:   In your present state of health, do you have any difficulty performing the following activities: 06/01/2016 05/25/2016  Hearing? N N  Vision? N N  Difficulty concentrating or making decisions? N N  Walking or climbing stairs? N N  Dressing or bathing? Y N  Doing errands, shopping? Y N  Preparing Food and eating ? Y -  Using the Toilet? N -  In the past six months, have you accidently leaked urine? N -  Do you have problems with loss of bowel control? N -  Managing your Medications? Y -  Managing your Finances? Y -  Housekeeping or managing your Housekeeping? Y -  Some recent data might be hidden    Fall/Depression Screening:    PHQ 2/9 Scores 06/18/2016 09/03/2015 05/24/2014 10/30/2013 11/01/2012  PHQ - 2 Score 2 1 2 6 6   PHQ- 9 Score 3 7 10 24 24     Assessment:    Plan:  THN CM Care Plan Problem One   Flowsheet Row Most Recent Value  Care Plan Problem One  knowledge deficit related to COPD as evidenced by patient verbalizing need for additional education around COPD zones  Role Documenting the Problem One  Care Management [place] for Problem One  Active  THN Long Term Goal (31-90 days)  Patient  will not have any COPD related hospitalizations over the next 31 days  THN Long Term Goal Start Date  06/01/16  Interventions for Problem One Long Term Goal  gave and reviewed the COPD zones with patient  THN CM Short Term Goal #1 (0-30 days)  Patient will be able to verbalize importance of learning and using COPD zones for self monitoring  THN CM Short Term Goal #1 Start Date  06/01/16  Interventions for Short Term Goal #1  gave COPD zones  THN CM Short Term Goal #2 (0-30 days)  Patient will  be able to self monitor for COPD symptoms and seek treatment for yellow zone symptoms over the next 21 days  THN CM Short Term Goal #2 Start Date  06/01/16  Interventions for Short Term Goal #2  reinforced yellow zone to let MD know of symptoms to prevent hospitalizations     Patient will take scooter rx to [facility].  RNCM will refer to [place] for housing resources RNCM will continue transition of care program with weekly calls   Royetta Crochet. Laymond Purser, RN, BSN, [place] [phone]

## 2016-06-19 ENCOUNTER — Telehealth: Payer: Self-pay

## 2016-06-19 ENCOUNTER — Telehealth: Payer: Self-pay | Admitting: Adult Health

## 2016-06-19 ENCOUNTER — Other Ambulatory Visit: Payer: Self-pay

## 2016-06-19 DIAGNOSIS — Z79899 Other long term (current) drug therapy: Secondary | ICD-10-CM

## 2016-06-19 MED ORDER — FUROSEMIDE 20 MG PO TABS: ORAL_TABLET | ORAL | 3 refills | Status: DC

## 2016-06-19 MED ORDER — SPIRONOLACTONE 25 MG PO TABS: ORAL_TABLET | ORAL | 6 refills | Status: DC

## 2016-06-19 MED ORDER — SPIRONOLACTONE 25 MG PO TABS: ORAL_TABLET | ORAL | 3 refills | Status: DC

## 2016-06-19 NOTE — Telephone Encounter (Signed)
Unable to escribe rx,will call at 9am to [facility]

## 2016-06-19 NOTE — Telephone Encounter (Signed)
Pt made aware, called pharmacy ( [facility]) to let them know of dose change on lasix. Put lab order in.

## 2016-06-19 NOTE — Telephone Encounter (Signed)
I spoke with [facility] and fixed spironolactone issue

## 2016-06-19 NOTE — Telephone Encounter (Signed)
Patient has questions regarding her medication changes at last visit. / tg

## 2016-06-24 ENCOUNTER — Encounter: Payer: Self-pay | Admitting: Licensed Clinical Social Worker

## 2016-06-24 ENCOUNTER — Other Ambulatory Visit: Payer: Self-pay | Admitting: Licensed Clinical Social Worker

## 2016-06-24 NOTE — Patient Outreach (Signed)
Assessment:  CSW received referral on [name]. CSW completed chart review on client on 06/24/16. Client is receiving [facility] nursing care with RN Burgess Amor.  Client sees Dr. Moshe Cipro as primary care doctor.  Client has stated that her daughter is caregiver for client. Client has stated that her daughter helps transport client to and from client's scheduled medical appointments. Client has new prescription for scooter. Client gets short of breath when walking short distances. Client is enrolled in [employer]). Client has a home health aide with [facility]) who assists client with client's care needs.  [place] assists client Mondays through Fridays for 7 hours each day. Client also has home health aide support for 3 hours on Saturdays and 3 hours on Sundays.   Client  also receives mobile meals support.  CSW spoke via phone with client on 06/24/16. CSW verified client identity. CSW and client spoke of client needs. Client has support from her daughter.  Client has expressed interest in looking at other housing options for client in the area. CSW spoke with client about [facility] apartments, [facility] apartments. [facility] apartments, and several other housing complexes in [place], [place] area. Client said she and her daughter will be going to different housing complexes to pick up applications as needed.   CSW and client spoke of client care plan. CSW encouraged client to communicate with CSW in next 30 days to discuss housing options for client in the area. Client has prescribed medications and is taking medications as prescribed. Client [facility] her [facility] from [facility].  Client is active in her church.  Client has chronic back pain issues. She is taking prescribed pain medication.  Client sees cardiologist as scheduled. Client has denied any recent substance use.  Client said she receives Food Stamps benefit each month (she said Food Stamps  benefit is small amount). She said she goes to food pantries as able for food support.  CSW spoke with client about [facility] and food support thorugh that agency.  CSW thanked client for phone call with CSW.  CSW encouraged [name] to call CSW at [phone] as needed to discuss social work needs of client.  Client was appreciative of phone call from [place] on 06/24/16.    Plan:  Client to communicate with CSW in next 30 days to discuss housing options for client in the area.  CSW to collaborate with RN Burgess Amor in monitoring needs of client.  CSW to call client in 2 weeks to assess client needs at that time.   [name].Jessyka Austria MSW, LCSW Licensed Clinical Social Worker [facility] Care Management [phone]

## 2016-06-26 ENCOUNTER — Other Ambulatory Visit: Payer: Self-pay | Admitting: *Deleted

## 2016-06-26 NOTE — Patient Outreach (Signed)
Call to patient for transition of care weekly call. No answer, unable to leave a message. Plan to attempt again next week Stanton Kidney E. Laymond Purser, RN, BSN, [place] [phone]

## 2016-06-30 ENCOUNTER — Other Ambulatory Visit: Payer: Self-pay | Admitting: Family Medicine

## 2016-07-01 ENCOUNTER — Ambulatory Visit: Payer: Self-pay | Admitting: Family Medicine

## 2016-07-03 ENCOUNTER — Other Ambulatory Visit: Payer: Self-pay | Admitting: *Deleted

## 2016-07-03 NOTE — Patient Outreach (Signed)
[facility]) Care Management  07/03/2016  [name] 12-Apr-1966 [id number]   Phone call  to patient to assess for community resource needs. Per [facility] request, patient in need of community resources for food.  Patient did not answer, HIPPA compliant voicemail message left requesting  a return call.  [place], [place] Management [phone]

## 2016-07-03 NOTE — Patient Outreach (Signed)
Transition of care outreach attempted. No answer, left voicemail for return call. [name]. Laymond Purser, RN, BSN, [place] [phone]

## 2016-07-03 NOTE — Patient Outreach (Signed)
Call back from patient. Patient stating she needs food resources, that she is out of food and does not get food stamps until 07/06/16 and needs food to take her medications this weekend. Even with the food stamps it is only $16.  She states she had been to food pantry but they were out of food and turned her away. She states she usually goes to the [facility], but they were turning people away.  Patient reports her daughter is her aide, and they have had a falling out over financial, so she quit as her aide and now she does not have an aide or  transportation to go get assistance right now until she can get a new aide to replace her daughter. She does have a call into the CAP provider requesting a new aide.  RNCM did discuss checking in with her church to see if they could provide assistance, she states she will call and see if they can help.   Plan to speak with [facility] LCSW covering for assigned LCSW. Regarding resources if any would be available for this situation. Royetta Crochet. Laymond Purser, RN, BSN, [place] [phone]

## 2016-07-03 NOTE — Patient Outreach (Signed)
[facility] [facility]) Care Management  07/03/2016  AREEN ASARO 1965-11-21 [id number]   Return call from patient who states that she does not have any food to take her medication with.  This Education officer, museum covering for assigned social worker Scott Forrest. Per patient, she does have some canned foods but needs more meats.  Patient's [facility] (CAP) Case Manager-Vicki Holly Bodily [phone] arrived to see patient during the phone call.  Consent given for this social worker to speak to her regarding available food resources.  Per patient, she has been to the [employer] and "they are turning people away".   Per [facility] is closed today as well.  Jocelyn Lamer further reports that patient's daughter is no longer providing aid services for patient and that they are working on finding a replacement.  Jocelyn Lamer agrees to supply patient with enough food to last her until she receives her food stamps on 07/06/16.    Sheralyn Boatman [facility] Care Management [phone]   Plan:  This Education officer, museum will inform RNCM and assigned social worker Theadore Nan, LCSW

## 2016-07-07 ENCOUNTER — Other Ambulatory Visit: Payer: Self-pay | Admitting: Licensed Clinical Social Worker

## 2016-07-07 ENCOUNTER — Other Ambulatory Visit: Payer: Self-pay | Admitting: *Deleted

## 2016-07-07 DIAGNOSIS — E1051 Type 1 diabetes mellitus with diabetic peripheral angiopathy without gangrene: Secondary | ICD-10-CM | POA: Diagnosis not present

## 2016-07-07 DIAGNOSIS — I739 Peripheral vascular disease, unspecified: Secondary | ICD-10-CM | POA: Diagnosis not present

## 2016-07-07 NOTE — Patient Outreach (Signed)
Assessment:  CSW spoke via phone with client on 07/07/16. CSW verified client identity. CSW received verbal permission from client on 07/07/16 for CSW to speak with client about client needs. Client is currently residing at [employer] and has talked with [place] about housing options for client in the area.  Client said she is still looking for affordable housing options for client in the area. Client receives Food Stamps benefit. She spoke recently with [place] about food needs of client. Client received recent food supplies through [employer] (Vickie). Client received her monthly Food Stamps benefit on 07/06/16. CSW Theadore Nan spoke with client about local [employer] and assistance available for food support with that agency.CSW gave client the contact phone number for [facility] and talked with client about Special educational needs teacher of that agency to inquire about food support for client   CSW and client spoke of client care plan.  CSW encouraged client to communicate with CSW in next 30 days to discuss housing options for client in the area.  Client said she is looking for an apartment that is pet friendly.  Client said she had talked with one apartment complex manager recently and that manager is mailing client an application to that apartment complex. Client said she would have to complete that application for housing and submit application with application fee to apartment complex manager. Client has support from her daughter. Client also has been receiving scheduled weekly support with [employer] home health aide as scheduled.  Client receives Meals on Wheels support with frozen meals every 2 weeks.  These frozen meals are delivered to home address of client.  She said she had her prescribed medications and is taking medications as prescribed.  Client sees Dr. Moshe Cipro as her primary care doctor and is attending  scheduled medical appointments as needed.  Client is dependent on family or friends to meet transport needs of client.  CSW encouraged client to call CSW at [phone] as needed to discuss social work needs of client. CSW thanked client for phone call with CSW on 07/07/16.    Plan:  Client to communicate with CSW in next 30 days to discuss housing options for client in the area.  CSW to call client in 3 weeks to assess client needs.  [name].Renny Remer MSW, LCSW Licensed Clinical Social Worker [facility] Care Management [phone]

## 2016-07-07 NOTE — Patient Outreach (Signed)
Call to patient for last transition of care. Left VM requesting call back. Plan for home visit next week if able to coordinate with patient. [name]. Laymond Purser, RN, BSN, [place] [phone]

## 2016-07-11 DIAGNOSIS — J441 Chronic obstructive pulmonary disease with (acute) exacerbation: Secondary | ICD-10-CM | POA: Diagnosis not present

## 2016-07-13 DIAGNOSIS — I509 Heart failure, unspecified: Secondary | ICD-10-CM | POA: Diagnosis not present

## 2016-07-14 ENCOUNTER — Other Ambulatory Visit: Payer: Self-pay | Admitting: *Deleted

## 2016-07-14 NOTE — Patient Outreach (Signed)
Call to patient. No answer, left voicemail with RNCM contact information requesting call back. Plan to attempt outreach again later [place]. Laymond Purser, RN, BSN, [place] [phone]

## 2016-07-14 NOTE — Patient Outreach (Signed)
Call back from patient  Spoke with patient to discuss transfer of case to new RNCM and follow up on food issues. Patient has Mobile [employer] for food now, she states 'they are a blessing'   Patient has spoken with [facility] LCSW regarding apartments. She is on wait list for income based. She also looked into an apartment that is not income based but she would have to pay for electric bill in addition to the rent, and a 0000000 application fee, she agrees to continue to work with [facility] LCSW on apartment issues.  Patient states her overall health is good, no issues with shortness of breath. Patient continues to look for new CAP aide to replace her daughter. Patient using [employer] for transportation to MD appointments. Patient has a lot of faith and trusts that her situation will improve soon.  Plan to transfer case to Janalyn Shy for ongoing care management.  Will report to Janalyn Shy, RN. Royetta Crochet. Laymond Purser, RN, BSN, [place] [phone]

## 2016-07-23 ENCOUNTER — Ambulatory Visit (INDEPENDENT_AMBULATORY_CARE_PROVIDER_SITE_OTHER): Payer: Medicare Other | Admitting: Cardiovascular Disease

## 2016-07-23 ENCOUNTER — Other Ambulatory Visit (HOSPITAL_COMMUNITY)
Admission: RE | Admit: 2016-07-23 | Discharge: 2016-07-23 | Disposition: A | Discharge status: Home / Self Care | Payer: Medicare Other | Source: Ambulatory Visit | Attending: Cardiovascular Disease | Admitting: Cardiovascular Disease

## 2016-07-23 ENCOUNTER — Other Ambulatory Visit: Payer: Self-pay | Admitting: Cardiovascular Disease

## 2016-07-23 ENCOUNTER — Encounter: Payer: Self-pay | Admitting: Cardiovascular Disease

## 2016-07-23 VITALS — BP 186/120 | HR 88 | Ht 61.0 in | Wt 193.0 lb

## 2016-07-23 DIAGNOSIS — I429 Cardiomyopathy, unspecified: Secondary | ICD-10-CM

## 2016-07-23 DIAGNOSIS — I519 Heart disease, unspecified: Secondary | ICD-10-CM

## 2016-07-23 DIAGNOSIS — N183 Chronic kidney disease, stage 3 unspecified: Secondary | ICD-10-CM

## 2016-07-23 DIAGNOSIS — R0600 Dyspnea, unspecified: Secondary | ICD-10-CM

## 2016-07-23 DIAGNOSIS — R0609 Other forms of dyspnea: Secondary | ICD-10-CM | POA: Diagnosis not present

## 2016-07-23 LAB — BASIC METABOLIC PANEL
Anion gap: 6 (ref 5–15)
BUN: 19 mg/dL (ref 6–20)
CO2: 23 mmol/L (ref 22–32)
Calcium: 9.6 mg/dL (ref 8.9–10.3)
Chloride: 108 mmol/L (ref 101–111)
Creatinine, Ser: 1.43 mg/dL — ABNORMAL HIGH (ref 0.44–1.00)
GFR calc Af Amer: 48 mL/min — ABNORMAL LOW (ref >60)
GFR calc non Af Amer: 41 mL/min — ABNORMAL LOW (ref >60)
Glucose, Bld: 155 mg/dL — ABNORMAL HIGH (ref 65–99)
Potassium: 4.1 mmol/L (ref 3.5–5.1)
Sodium: 137 mmol/L (ref 135–145)

## 2016-07-23 LAB — CBC WITH DIFFERENTIAL/PLATELET
Basophils Absolute: 0 10*3/uL (ref 0.0–0.1)
Basophils Relative: 0 %
Eosinophils Absolute: 0.2 10*3/uL (ref 0.0–0.7)
Eosinophils Relative: 3 %
HCT: 44.1 % (ref 36.0–46.0)
Hemoglobin: 15 g/dL (ref 12.0–15.0)
Lymphocytes Relative: 30 %
Lymphs Abs: 2.2 10*3/uL (ref 0.7–4.0)
MCH: 31.8 pg (ref 26.0–34.0)
MCHC: 34 g/dL (ref 30.0–36.0)
MCV: 93.4 fL (ref 78.0–100.0)
Monocytes Absolute: 0.4 10*3/uL (ref 0.1–1.0)
Monocytes Relative: 5 %
Neutro Abs: 4.5 10*3/uL (ref 1.7–7.7)
Neutrophils Relative %: 62 %
Platelets: 221 10*3/uL (ref 150–400)
RBC: 4.72 MIL/uL (ref 3.87–5.11)
RDW: 13.6 % (ref 11.5–15.5)
WBC: 7.3 10*3/uL (ref 4.0–10.5)

## 2016-07-23 LAB — PROTIME-INR
INR: 0.86
Prothrombin Time: 11.7 seconds (ref 11.4–15.2)

## 2016-07-23 NOTE — Progress Notes (Signed)
SUBJECTIVE: [name] is a 50 y.o. female  with a history of hypertensive cardiomyopathy complicated by uncontrolled hypertension, DM, and CKD-3. She has had an EF of 45% in the past. Admitted 05/24/16 with COPD exacerbation and CHF. EF now 25%. Associated with restrictive diastolic filling pattern also associated mild to moderate RV dysfunction. At this point suspected nonischemic etiology in light of history of uncontrolled hypertension and previous hypertensive cardiomyopathy. She denied any recent substance abuse but does have a history in the past. She diuresed in the hospital and plan was to stabilize on medications and then recommend right and left heart catheterization as an outpatient.  She denies chest pain but complains of exertional dyspnea with a minimal amount of walking and fatigue. Denies exertional chest pain.  She said she was stressed out today and didn't take her medications but she otherwise takes them daily.   Review of Systems: As per "subjective", otherwise negative.  No Known Allergies  Current Outpatient Prescriptions  Medication Sig Dispense Refill  . albuterol (PROVENTIL HFA;VENTOLIN HFA) 108 (90 Base) MCG/ACT inhaler Inhale 2 puffs into the lungs every 6 (six) hours as needed for wheezing or shortness of breath. 18 g 0  . azelastine (ASTELIN) 0.1 % nasal spray USE 2 SPRAYS IN EACH NOSTRIL TWICE DAILY. 30 mL 5  . benazepril (LOTENSIN) 40 MG tablet TAKE 1 TABLET BY MOUTH ONCE A DAY. 30 tablet 2  . bisoprolol (ZEBETA) 5 MG tablet TAKE 3 TABLETS (15mg ) BY MOUTH ONCE DAILY. 90 tablet 2  . CHANTIX STARTING MONTH PAK 0.5 MG X 11 & 1 MG X 42 tablet Take 0.5 mg by mouth 2 (two) times daily.     . Choline Fenofibrate 135 MG capsule Take 1 capsule (135 mg total) by mouth daily. 30 capsule 4  . clopidogrel (PLAVIX) 75 MG tablet TAKE 1 TABLET BY MOUTH DAILY WITH BREAKFAST. 30 tablet 2  . FLUoxetine (PROZAC) 20 MG capsule TAKE 1 CAPSULE BY MOUTH EVERY DAY. 30  capsule 2  . fluticasone (FLONASE) 50 MCG/ACT nasal spray USE 2 SPRAYS IN EACH NOSTRIL DAILY. 16 g 0  . furosemide (LASIX) 20 MG tablet Take 20 mg daily. Dose decreased 8/25 90 tablet 3  . gabapentin (NEURONTIN) 100 MG capsule TAKE 1 CAPSULE BY MOUTH THREE TIMES A DAY. 90 capsule 2  . hydrOXYzine (ATARAX/VISTARIL) 50 MG tablet TAKE (1) TABLET BY MOUTH AT BEDTIME. 30 tablet 3  . ipratropium-albuterol (DUONEB) 0.5-2.5 (3) MG/3ML SOLN Take 3 mLs by nebulization every 6 (six) hours as needed. 360 mL 1  . montelukast (SINGULAIR) 10 MG tablet TAKE (1) TABLET BY MOUTH AT BEDTIME. 30 tablet 2  . pantoprazole (PROTONIX) 40 MG tablet TAKE 1 TABLET BY MOUTH ONCE A DAY. 30 tablet PRN  . potassium chloride SA (K-DUR,KLOR-CON) 20 MEQ tablet TAKE 1&1/2 TABLET BY MOUTH DAILY. 45 tablet 2  . predniSONE (DELTASONE) 10 MG tablet Take daily by mouth: 20 mg x3 days, then 10 mg x3 days, then stop. 9 tablet 0  . repaglinide (PRANDIN) 1 MG tablet TAKE 1 TABLET BY MOUTH 3 TIMES DAILY BEFORE MEALS. 90 tablet 2  . rosuvastatin (CRESTOR) 40 MG tablet TAKE 1 TABLET BY MOUTH ONCE A DAY. 30 tablet 3  . SPIRIVA HANDIHALER 18 MCG inhalation capsule Place 18 mcg into inhaler and inhale daily.     Marland Kitchen spironolactone (ALDACTONE) 25 MG tablet 1 tablet twice a day 60 tablet 3  . SYMBICORT 80-4.5 MCG/ACT inhaler INHALE 2 PUFFS  TWICE DAILY. 10.2 g 2  . tizanidine (ZANAFLEX) 2 MG capsule Take 1 capsule (2 mg total) by mouth 2 (two) times daily. 60 capsule 4  . TRADJENTA 5 MG TABS tablet TAKE 1 TABLET BY MOUTH ONCE A DAY. 30 tablet 2  . traMADol (ULTRAM) 50 MG tablet TAKE 1 TABLET BY MOUTH TWICE A DAY. 60 tablet 2  . traZODone (DESYREL) 50 MG tablet TAKE (1) TABLET BY MOUTH AT BEDTIME. 30 tablet 2  . Vitamin D, Ergocalciferol, (DRISDOL) 50000 units CAPS capsule TAKE 1 CAPSULE BY MOUTH ONCE A WEEK. 4 capsule 2  . ZETIA 10 MG tablet TAKE 1 TABLET BY MOUTH ONCE A DAY. 30 tablet 2   No current facility-administered medications for this  visit.     Past Medical History:  Diagnosis Date  . Alcohol use ([place])    Now abstinent.  . Ankle fracture, lateral malleolus, closed 12/30/2011  . Anxiety   . Breast mass, left 12/15/2011  . CKD (chronic kidney disease)   . COPD (chronic obstructive pulmonary disease) ([place])   . Diabetes mellitus, type 2 ([place])   . Diabetic Charcot foot ([place]) 12/30/2011  . Essential hypertension   . History of GI bleed 12/05/2009   Qualifier: Diagnosis of  By: Zeb Comfort    . Hyperlipidemia   . Hypertensive cardiomyopathy ([place]) 11/08/2012   Ejection fraction 40-45%.  . Noncompliance with medication regimen   . Obesity   . Panic attacks   . Tobacco abuse     Past Surgical History:  Procedure Laterality Date  . BREAST BIOPSY    . DILATION AND CURETTAGE OF UTERUS      Social History   Social History  . Marital status: Widowed    Spouse name: N/A  . Number of children: 2  . Years of education: N/A   Occupational History  . Not on file.   Social History Main Topics  . Smoking status: Former Smoker    Packs/day: 1.00    Years: 30.00    Types: Cigarettes    Quit date: 06/03/2016  . Smokeless tobacco: Never Used  . Alcohol use 0.0 oz/week     Comment: wine and mickeys   . Drug use:     Types: "Crack" cocaine     Comment: crack  . Sexual activity: Yes    Birth control/ protection: None   Other Topics Concern  . Not on file   Social History Narrative  . No narrative on file     Vitals:   07/23/16 1359  BP: (!) 186/120  Pulse: 88  SpO2: 97%  Weight: 193 lb (87.5 kg)  Height: 5\' 1"  (1.549 m)    PHYSICAL EXAM General: NAD HEENT: Normal. Neck: No JVD, no thyromegaly. Lungs: Diminished throughout, no rales/wheezes. CV: Nondisplaced PMI.  Regular rate and rhythm, normal S1/S2, no S3/S4, no murmur. No pretibial or periankle edema.  Abdomen: Soft, nontender, no distention.  Neurologic: Alert and oriented.  Psych: Normal affect. Skin: Normal. Musculoskeletal: No gross  deformities.    ECG: Most recent ECG reviewed.      ASSESSMENT AND PLAN: 1. Hypertensive cardiomyopathy/severe LV dysfunction: Aim to control BP. Continue Lasix 20 mg daily. I will proceed with coronary angiography to rule out an ischemic etiology.  2. Malignant HTN: Severely elevated. Currently taking spironolactone 25 mg twice daily, benazepril 40 mg daily, and bisoprolol 15 mg daily. Creatinine 1.81 06/17/16.  She said she was stressed out today and didn't take her medications but she otherwise  takes them daily. If it remains elevated in the future, I will add hydralazine 25 mg TID.  3. CKD stage 3: Creatinine 1.81 06/17/16.   Dispo: fu after cath  Kate Sable, M.D., F.A.C.C.

## 2016-07-23 NOTE — Patient Instructions (Addendum)
Your physician recommends that you schedule a follow-up appointment in:  To be scheduled after heart cath    Your physician has requested that you have a cardiac catheterization. Cardiac catheterization is used to diagnose and/or treat various heart conditions. Doctors may recommend this procedure for a number of different reasons. The most common reason is to evaluate chest pain. Chest pain can be a symptom of coronary artery disease (CAD), and cardiac catheterization can show whether plaque is narrowing or blocking your heart's arteries. This procedure is also used to evaluate the valves, as well as measure the blood flow and oxygen levels in different parts of your heart. For further information please visit [email or web address]. Please follow instruction sheet, as given.   HOLD LASIX AM OF CATH   GET LAB WORK    Thank you for choosing [facility] !

## 2016-07-23 NOTE — Addendum Note (Signed)
Addended by: [name] A on: 07/23/2016 02:39 PM   Modules accepted: Orders

## 2016-07-24 ENCOUNTER — Encounter (HOSPITAL_COMMUNITY): Payer: Self-pay | Admitting: Cardiology

## 2016-07-28 ENCOUNTER — Encounter (HOSPITAL_COMMUNITY)
Admission: RE | Disposition: A | Discharge status: Home / Self Care | Payer: Self-pay | Source: Ambulatory Visit | Attending: Interventional Cardiology

## 2016-07-28 ENCOUNTER — Other Ambulatory Visit: Payer: Self-pay | Admitting: Licensed Clinical Social Worker

## 2016-07-28 ENCOUNTER — Ambulatory Visit (HOSPITAL_COMMUNITY)
Admission: RE | Admit: 2016-07-28 | Discharge: 2016-07-28 | Disposition: A | Discharge status: Home / Self Care | Payer: Medicare Other | Source: Ambulatory Visit | Attending: Interventional Cardiology | Admitting: Interventional Cardiology

## 2016-07-28 ENCOUNTER — Encounter (HOSPITAL_COMMUNITY): Payer: Self-pay | Admitting: *Deleted

## 2016-07-28 DIAGNOSIS — F419 Anxiety disorder, unspecified: Secondary | ICD-10-CM | POA: Insufficient documentation

## 2016-07-28 DIAGNOSIS — Z87891 Personal history of nicotine dependence: Secondary | ICD-10-CM | POA: Insufficient documentation

## 2016-07-28 DIAGNOSIS — I519 Heart disease, unspecified: Secondary | ICD-10-CM | POA: Diagnosis not present

## 2016-07-28 DIAGNOSIS — Z7902 Long term (current) use of antithrombotics/antiplatelets: Secondary | ICD-10-CM | POA: Insufficient documentation

## 2016-07-28 DIAGNOSIS — E785 Hyperlipidemia, unspecified: Secondary | ICD-10-CM | POA: Diagnosis not present

## 2016-07-28 DIAGNOSIS — Z7952 Long term (current) use of systemic steroids: Secondary | ICD-10-CM | POA: Diagnosis not present

## 2016-07-28 DIAGNOSIS — J449 Chronic obstructive pulmonary disease, unspecified: Secondary | ICD-10-CM | POA: Diagnosis not present

## 2016-07-28 DIAGNOSIS — N183 Chronic kidney disease, stage 3 (moderate): Secondary | ICD-10-CM | POA: Insufficient documentation

## 2016-07-28 DIAGNOSIS — I130 Hypertensive heart and chronic kidney disease with heart failure and stage 1 through stage 4 chronic kidney disease, or unspecified chronic kidney disease: Secondary | ICD-10-CM | POA: Insufficient documentation

## 2016-07-28 DIAGNOSIS — I509 Heart failure, unspecified: Secondary | ICD-10-CM | POA: Diagnosis not present

## 2016-07-28 DIAGNOSIS — E1122 Type 2 diabetes mellitus with diabetic chronic kidney disease: Secondary | ICD-10-CM | POA: Diagnosis not present

## 2016-07-28 DIAGNOSIS — F1490 Cocaine use, unspecified, uncomplicated: Secondary | ICD-10-CM | POA: Insufficient documentation

## 2016-07-28 DIAGNOSIS — Z7951 Long term (current) use of inhaled steroids: Secondary | ICD-10-CM | POA: Insufficient documentation

## 2016-07-28 DIAGNOSIS — Z9114 Patient's other noncompliance with medication regimen: Secondary | ICD-10-CM | POA: Insufficient documentation

## 2016-07-28 DIAGNOSIS — E669 Obesity, unspecified: Secondary | ICD-10-CM | POA: Insufficient documentation

## 2016-07-28 DIAGNOSIS — I43 Cardiomyopathy in diseases classified elsewhere: Secondary | ICD-10-CM | POA: Diagnosis not present

## 2016-07-28 DIAGNOSIS — I429 Cardiomyopathy, unspecified: Secondary | ICD-10-CM

## 2016-07-28 HISTORY — PX: CARDIAC CATHETERIZATION: SHX172

## 2016-07-28 LAB — GLUCOSE, CAPILLARY
Glucose-Capillary: 153 mg/dL — ABNORMAL HIGH (ref 65–99)
Glucose-Capillary: 115 mg/dL — ABNORMAL HIGH (ref 65–99)

## 2016-07-28 SURGERY — LEFT HEART CATH AND CORONARY ANGIOGRAPHY

## 2016-07-28 MED ORDER — SODIUM CHLORIDE 0.9 % IV SOLN: 250.0000 mL | INTRAVENOUS | Status: DC | PRN

## 2016-07-28 MED ORDER — SODIUM CHLORIDE 0.9% FLUSH: 3.0000 mL | INTRAVENOUS | Status: DC | PRN

## 2016-07-28 MED ORDER — HEPARIN SODIUM (PORCINE) 1000 UNIT/ML IJ SOLN
INTRAMUSCULAR | Status: DC | PRN
  Administered 2016-07-28: 4500 [IU] via INTRAVENOUS

## 2016-07-28 MED ORDER — HYDRALAZINE HCL 20 MG/ML IJ SOLN
INTRAMUSCULAR | Status: AC
  Administered 2016-07-28: 20 mg via INTRAVENOUS
  Filled 2016-07-28: qty 1

## 2016-07-28 MED ORDER — FENTANYL CITRATE (PF) 100 MCG/2ML IJ SOLN
INTRAMUSCULAR | Status: DC | PRN
  Administered 2016-07-28 (×2): 25 ug via INTRAVENOUS

## 2016-07-28 MED ORDER — SODIUM CHLORIDE 0.9% FLUSH: 3.0000 mL | Freq: Two times a day (BID) | INTRAVENOUS | Status: DC

## 2016-07-28 MED ORDER — NITROGLYCERIN 1 MG/10 ML FOR IR/CATH LAB
INTRA_ARTERIAL | Status: DC | PRN
  Administered 2016-07-28: 200 ug via INTRA_ARTERIAL

## 2016-07-28 MED ORDER — MIDAZOLAM HCL 2 MG/2ML IJ SOLN
INTRAMUSCULAR | Status: AC
  Filled 2016-07-28: qty 2

## 2016-07-28 MED ORDER — HYDRALAZINE HCL 20 MG/ML IJ SOLN
INTRAMUSCULAR | Status: DC | PRN
  Administered 2016-07-28: 20 mg via INTRAVENOUS

## 2016-07-28 MED ORDER — SODIUM CHLORIDE 0.9 % WEIGHT BASED INFUSION: 1.0000 mL/kg/h | INTRAVENOUS | Status: DC

## 2016-07-28 MED ORDER — HYDRALAZINE HCL 20 MG/ML IJ SOLN
INTRAMUSCULAR | Status: AC
  Filled 2016-07-28: qty 1

## 2016-07-28 MED ORDER — HEPARIN (PORCINE) IN NACL 2-0.9 UNIT/ML-% IJ SOLN
INTRAMUSCULAR | Status: DC | PRN
Start: 2016-07-28 — End: 2016-07-28
  Administered 2016-07-28: 1000 mL

## 2016-07-28 MED ORDER — HEPARIN (PORCINE) IN NACL 2-0.9 UNIT/ML-% IJ SOLN
INTRAMUSCULAR | Status: AC
Start: 2016-07-28 — End: 2016-07-28
  Filled 2016-07-28: qty 1000

## 2016-07-28 MED ORDER — HEPARIN SODIUM (PORCINE) 1000 UNIT/ML IJ SOLN
INTRAMUSCULAR | Status: AC
  Filled 2016-07-28: qty 1

## 2016-07-28 MED ORDER — LIDOCAINE HCL (PF) 1 % IJ SOLN
INTRAMUSCULAR | Status: AC
  Filled 2016-07-28: qty 30

## 2016-07-28 MED ORDER — ASPIRIN 81 MG PO CHEW: 81.0000 mg | CHEWABLE_TABLET | ORAL | Status: DC

## 2016-07-28 MED ORDER — SODIUM CHLORIDE 0.9 % IV SOLN
INTRAVENOUS | Status: DC
  Administered 2016-07-28: 10:00:00 via INTRAVENOUS

## 2016-07-28 MED ORDER — NITROGLYCERIN 1 MG/10 ML FOR IR/CATH LAB
INTRA_ARTERIAL | Status: AC
  Filled 2016-07-28: qty 10

## 2016-07-28 MED ORDER — HYDRALAZINE HCL 20 MG/ML IJ SOLN
20.0000 mg | Freq: Once | INTRAMUSCULAR | Status: AC
  Administered 2016-07-28: 20 mg via INTRAVENOUS

## 2016-07-28 MED ORDER — VERAPAMIL HCL 2.5 MG/ML IV SOLN
INTRAVENOUS | Status: DC | PRN
  Administered 2016-07-28: 12:00:00 via INTRA_ARTERIAL

## 2016-07-28 MED ORDER — FENTANYL CITRATE (PF) 100 MCG/2ML IJ SOLN
INTRAMUSCULAR | Status: AC
  Filled 2016-07-28: qty 2

## 2016-07-28 MED ORDER — LIDOCAINE HCL (PF) 1 % IJ SOLN
INTRAMUSCULAR | Status: DC | PRN
  Administered 2016-07-28: 2 mL via SUBCUTANEOUS

## 2016-07-28 MED ORDER — IOPAMIDOL (ISOVUE-370) INJECTION 76%
INTRAVENOUS | Status: DC | PRN
  Administered 2016-07-28: 45 mL via INTRA_ARTERIAL

## 2016-07-28 MED ORDER — IOPAMIDOL (ISOVUE-370) INJECTION 76%
INTRAVENOUS | Status: AC
  Filled 2016-07-28: qty 100

## 2016-07-28 MED ORDER — MIDAZOLAM HCL 2 MG/2ML IJ SOLN
INTRAMUSCULAR | Status: DC | PRN
  Administered 2016-07-28: 1 mg via INTRAVENOUS
  Administered 2016-07-28: 2 mg via INTRAVENOUS

## 2016-07-28 MED ORDER — VERAPAMIL HCL 2.5 MG/ML IV SOLN
INTRAVENOUS | Status: AC
  Filled 2016-07-28: qty 2

## 2016-07-28 SURGICAL SUPPLY — 12 items
CATH IMPULSE 5F ANG/FL3.5 (CATHETERS) ×1 IMPLANT
CATH LAUNCHER 5F EBU3.0 (CATHETERS) IMPLANT
CATHETER LAUNCHER 5F EBU3.0 (CATHETERS) ×2
DEVICE RAD COMP TR BAND LRG (VASCULAR PRODUCTS) ×1 IMPLANT
GLIDESHEATH SLEND SS 6F .021 (SHEATH) ×1 IMPLANT
GUIDEWIRE ANGLED .035X150CM (WIRE) ×1 IMPLANT
KIT HEART LEFT (KITS) ×2 IMPLANT
PACK CARDIAC CATHETERIZATION (CUSTOM PROCEDURE TRAY) ×2 IMPLANT
TRANSDUCER W/STOPCOCK (MISCELLANEOUS) ×2 IMPLANT
TUBING CIL FLEX 10 FLL-RA (TUBING) ×2 IMPLANT
WIRE HI TORQ VERSACORE-J 145CM (WIRE) ×1 IMPLANT
WIRE SAFE-T 1.5MM-J .035X260CM (WIRE) ×1 IMPLANT

## 2016-07-28 NOTE — Progress Notes (Signed)
Dr Irish Lack notified of B/P and order noted

## 2016-07-28 NOTE — H&P (View-Only) (Signed)
SUBJECTIVE: [name] is a 50 y.o. female  with a history of hypertensive cardiomyopathy complicated by uncontrolled hypertension, DM, and CKD-3. She has had an EF of 45% in the past. Admitted 05/24/16 with COPD exacerbation and CHF. EF now 25%. Associated with restrictive diastolic filling pattern also associated mild to moderate RV dysfunction. At this point suspected nonischemic etiology in light of history of uncontrolled hypertension and previous hypertensive cardiomyopathy. She denied any recent substance abuse but does have a history in the past. She diuresed in the hospital and plan was to stabilize on medications and then recommend right and left heart catheterization as an outpatient.  She denies chest pain but complains of exertional dyspnea with a minimal amount of walking and fatigue. Denies exertional chest pain.  She said she was stressed out today and didn't take her medications but she otherwise takes them daily.   Review of Systems: As per "subjective", otherwise negative.  No Known Allergies  Current Outpatient Prescriptions  Medication Sig Dispense Refill  . albuterol (PROVENTIL HFA;VENTOLIN HFA) 108 (90 Base) MCG/ACT inhaler Inhale 2 puffs into the lungs every 6 (six) hours as needed for wheezing or shortness of breath. 18 g 0  . azelastine (ASTELIN) 0.1 % nasal spray USE 2 SPRAYS IN EACH NOSTRIL TWICE DAILY. 30 mL 5  . benazepril (LOTENSIN) 40 MG tablet TAKE 1 TABLET BY MOUTH ONCE A DAY. 30 tablet 2  . bisoprolol (ZEBETA) 5 MG tablet TAKE 3 TABLETS (15mg ) BY MOUTH ONCE DAILY. 90 tablet 2  . CHANTIX STARTING MONTH PAK 0.5 MG X 11 & 1 MG X 42 tablet Take 0.5 mg by mouth 2 (two) times daily.     . Choline Fenofibrate 135 MG capsule Take 1 capsule (135 mg total) by mouth daily. 30 capsule 4  . clopidogrel (PLAVIX) 75 MG tablet TAKE 1 TABLET BY MOUTH DAILY WITH BREAKFAST. 30 tablet 2  . FLUoxetine (PROZAC) 20 MG capsule TAKE 1 CAPSULE BY MOUTH EVERY DAY. 30  capsule 2  . fluticasone (FLONASE) 50 MCG/ACT nasal spray USE 2 SPRAYS IN EACH NOSTRIL DAILY. 16 g 0  . furosemide (LASIX) 20 MG tablet Take 20 mg daily. Dose decreased 8/25 90 tablet 3  . gabapentin (NEURONTIN) 100 MG capsule TAKE 1 CAPSULE BY MOUTH THREE TIMES A DAY. 90 capsule 2  . hydrOXYzine (ATARAX/VISTARIL) 50 MG tablet TAKE (1) TABLET BY MOUTH AT BEDTIME. 30 tablet 3  . ipratropium-albuterol (DUONEB) 0.5-2.5 (3) MG/3ML SOLN Take 3 mLs by nebulization every 6 (six) hours as needed. 360 mL 1  . montelukast (SINGULAIR) 10 MG tablet TAKE (1) TABLET BY MOUTH AT BEDTIME. 30 tablet 2  . pantoprazole (PROTONIX) 40 MG tablet TAKE 1 TABLET BY MOUTH ONCE A DAY. 30 tablet PRN  . potassium chloride SA (K-DUR,KLOR-CON) 20 MEQ tablet TAKE 1&1/2 TABLET BY MOUTH DAILY. 45 tablet 2  . predniSONE (DELTASONE) 10 MG tablet Take daily by mouth: 20 mg x3 days, then 10 mg x3 days, then stop. 9 tablet 0  . repaglinide (PRANDIN) 1 MG tablet TAKE 1 TABLET BY MOUTH 3 TIMES DAILY BEFORE MEALS. 90 tablet 2  . rosuvastatin (CRESTOR) 40 MG tablet TAKE 1 TABLET BY MOUTH ONCE A DAY. 30 tablet 3  . SPIRIVA HANDIHALER 18 MCG inhalation capsule Place 18 mcg into inhaler and inhale daily.     Marland Kitchen spironolactone (ALDACTONE) 25 MG tablet 1 tablet twice a day 60 tablet 3  . SYMBICORT 80-4.5 MCG/ACT inhaler INHALE 2 PUFFS  TWICE DAILY. 10.2 g 2  . tizanidine (ZANAFLEX) 2 MG capsule Take 1 capsule (2 mg total) by mouth 2 (two) times daily. 60 capsule 4  . TRADJENTA 5 MG TABS tablet TAKE 1 TABLET BY MOUTH ONCE A DAY. 30 tablet 2  . traMADol (ULTRAM) 50 MG tablet TAKE 1 TABLET BY MOUTH TWICE A DAY. 60 tablet 2  . traZODone (DESYREL) 50 MG tablet TAKE (1) TABLET BY MOUTH AT BEDTIME. 30 tablet 2  . Vitamin D, Ergocalciferol, (DRISDOL) 50000 units CAPS capsule TAKE 1 CAPSULE BY MOUTH ONCE A WEEK. 4 capsule 2  . ZETIA 10 MG tablet TAKE 1 TABLET BY MOUTH ONCE A DAY. 30 tablet 2   No current facility-administered medications for this  visit.     Past Medical History:  Diagnosis Date  . Alcohol use ([place])    Now abstinent.  . Ankle fracture, lateral malleolus, closed 12/30/2011  . Anxiety   . Breast mass, left 12/15/2011  . CKD (chronic kidney disease)   . COPD (chronic obstructive pulmonary disease) ([place])   . Diabetes mellitus, type 2 ([place])   . Diabetic Charcot foot ([place]) 12/30/2011  . Essential hypertension   . History of GI bleed 12/05/2009   Qualifier: Diagnosis of  By: Zeb Comfort    . Hyperlipidemia   . Hypertensive cardiomyopathy ([place]) 11/08/2012   Ejection fraction 40-45%.  . Noncompliance with medication regimen   . Obesity   . Panic attacks   . Tobacco abuse     Past Surgical History:  Procedure Laterality Date  . BREAST BIOPSY    . DILATION AND CURETTAGE OF UTERUS      Social History   Social History  . Marital status: Widowed    Spouse name: N/A  . Number of children: 2  . Years of education: N/A   Occupational History  . Not on file.   Social History Main Topics  . Smoking status: Former Smoker    Packs/day: 1.00    Years: 30.00    Types: Cigarettes    Quit date: 06/03/2016  . Smokeless tobacco: Never Used  . Alcohol use 0.0 oz/week     Comment: wine and mickeys   . Drug use:     Types: "Crack" cocaine     Comment: crack  . Sexual activity: Yes    Birth control/ protection: None   Other Topics Concern  . Not on file   Social History Narrative  . No narrative on file     Vitals:   07/23/16 1359  BP: (!) 186/120  Pulse: 88  SpO2: 97%  Weight: 193 lb (87.5 kg)  Height: 5\' 1"  (1.549 m)    PHYSICAL EXAM General: NAD HEENT: Normal. Neck: No JVD, no thyromegaly. Lungs: Diminished throughout, no rales/wheezes. CV: Nondisplaced PMI.  Regular rate and rhythm, normal S1/S2, no S3/S4, no murmur. No pretibial or periankle edema.  Abdomen: Soft, nontender, no distention.  Neurologic: Alert and oriented.  Psych: Normal affect. Skin: Normal. Musculoskeletal: No gross  deformities.    ECG: Most recent ECG reviewed.      ASSESSMENT AND PLAN: 1. Hypertensive cardiomyopathy/severe LV dysfunction: Aim to control BP. Continue Lasix 20 mg daily. I will proceed with coronary angiography to rule out an ischemic etiology.  2. Malignant HTN: Severely elevated. Currently taking spironolactone 25 mg twice daily, benazepril 40 mg daily, and bisoprolol 15 mg daily. Creatinine 1.81 06/17/16.  She said she was stressed out today and didn't take her medications but she otherwise  takes them daily. If it remains elevated in the future, I will add hydralazine 25 mg TID.  3. CKD stage 3: Creatinine 1.81 06/17/16.   Dispo: fu after cath  Kate Sable, M.D., F.A.C.C.

## 2016-07-28 NOTE — Interval H&P Note (Signed)
Cath Lab Visit (complete for each Cath Lab visit)  Clinical Evaluation Leading to the Procedure:   ACS: No.  Non-ACS:    Anginal Classification: CCS III  Anti-ischemic medical therapy: Minimal Therapy (1 class of medications)  Non-Invasive Test Results: High-risk stress test findings: cardiac mortality >3%/year  Prior CABG: No previous CABG      History and Physical Interval Note:  07/28/2016 12:00 PM  [name]  has presented today for surgery, with the diagnosis of cm  The various methods of treatment have been discussed with the patient and family. After consideration of risks, benefits and other options for treatment, the patient has consented to  Procedure(s): Left Heart Cath and Coronary Angiography (N/A) as a surgical intervention .  The patient's history has been reviewed, patient examined, no change in status, stable for surgery.  I have reviewed the patient's chart and labs.  Questions were answered to the patient's satisfaction.     Larae Grooms

## 2016-07-28 NOTE — Discharge Instructions (Signed)
Radial Site Care °Refer to this sheet in the next few weeks. These instructions provide you with information about caring for yourself after your procedure. Your health care provider may also give you more specific instructions. Your treatment has been planned according to current medical practices, but problems sometimes occur. Call your health care provider if you have any problems or questions after your procedure. °WHAT TO EXPECT AFTER THE PROCEDURE °After your procedure, it is typical to have the following: °· Bruising at the radial site that usually fades within 1-2 weeks. °· Blood collecting in the tissue (hematoma) that may be painful to the touch. It should usually decrease in size and tenderness within 1-2 weeks. °HOME CARE INSTRUCTIONS °· Take medicines only as directed by your health care provider. °· You may shower 24-48 hours after the procedure or as directed by your health care provider. Remove the bandage (dressing) and gently wash the site with plain soap and water. Pat the area dry with a clean towel. Do not rub the site, because this may cause bleeding. °· Do not take baths, swim, or use a hot tub until your health care provider approves. °· Check your insertion site every day for redness, swelling, or drainage. °· Do not apply powder or lotion to the site. °· Do not flex or bend the affected arm for 24 hours or as directed by your health care provider. °· Do not push or pull heavy objects with the affected arm for 24 hours or as directed by your health care provider. °· Do not lift over 10 lb (4.5 kg) for 5 days after your procedure or as directed by your health care provider. °· Ask your health care provider when it is okay to: °¨ Return to work or school. °¨ Resume usual physical activities or sports. °¨ Resume sexual activity. °· Do not drive home if you are discharged the same day as the procedure. Have someone else drive you. °· You may drive 24 hours after the procedure unless otherwise  instructed by your health care provider. °· Do not operate machinery or power tools for 24 hours after the procedure. °· If your procedure was done as an outpatient procedure, which means that you went home the same day as your procedure, a responsible adult should be with you for the first 24 hours after you arrive home. °· Keep all follow-up visits as directed by your health care provider. This is important. °SEEK MEDICAL CARE IF: °· You have a fever. °· You have chills. °· You have increased bleeding from the radial site. Hold pressure on the site. °SEEK IMMEDIATE MEDICAL CARE IF: °· You have unusual pain at the radial site. °· You have redness, warmth, or swelling at the radial site. °· You have drainage (other than a small amount of blood on the dressing) from the radial site. °· The radial site is bleeding, and the bleeding does not stop after 30 minutes of holding steady pressure on the site. °· Your arm or hand becomes pale, cool, tingly, or numb. °  °This information is not intended to replace advice given to you by your health care provider. Make sure you discuss any questions you have with your health care provider. °  °Document Released: 11/14/2010 Document Revised: 11/02/2014 Document Reviewed: 04/30/2014 °Elsevier Interactive Patient Education ©2016 [employer] ° °

## 2016-07-28 NOTE — Patient Outreach (Signed)
Assessment:  CSW spoke via phone with client. CSW verified client identity. CSW and client spoke of client needs. Client sees Dr. Moshe Cipro as primary care doctor. Client receives Meals on Wheels frozen meals support. Client had been residing at [facility] and is still looking for another residence for client in the area.  Client receives Food Stamps benefit. CSW had talked with client about [facility] and food assistance available through that agency.  CSW and client spoke of client care plan. CSW encouraged client to communicate with CSW in next 30 days to discuss housing options for client in the area.  Client has her prescribed medications and is taking medications as prescribed. Client said she relies on family and friends to transport her to and from her scheduled medical appointments. Client said she is trying to attend scheduled medical appointments. CSW encouraged [name] to call CSW at [phone] as needed to discuss social work needs of client.      Plan:  Client to communicate with CSW in next 30 days to discuss housing options for client in the area.  CSW to call client in 4 weeks to assess client needs at that time.    [name].[name] MSW, LCSW Licensed Clinical Social Worker [facility] Care Management [phone]

## 2016-07-29 ENCOUNTER — Encounter (HOSPITAL_COMMUNITY): Payer: Self-pay | Admitting: Interventional Cardiology

## 2016-08-04 ENCOUNTER — Other Ambulatory Visit: Payer: Self-pay | Admitting: Family Medicine

## 2016-08-05 ENCOUNTER — Other Ambulatory Visit: Payer: Self-pay | Admitting: Family Medicine

## 2016-08-06 ENCOUNTER — Other Ambulatory Visit: Payer: Self-pay | Admitting: Family Medicine

## 2016-08-10 DIAGNOSIS — J441 Chronic obstructive pulmonary disease with (acute) exacerbation: Secondary | ICD-10-CM | POA: Diagnosis not present

## 2016-08-12 DIAGNOSIS — I509 Heart failure, unspecified: Secondary | ICD-10-CM | POA: Diagnosis not present

## 2016-08-24 ENCOUNTER — Telehealth: Payer: Self-pay | Admitting: Family Medicine

## 2016-08-24 NOTE — Telephone Encounter (Signed)
[name] is scheduled for next Monday Nov 6th at 1:40/she is aware to bring medications currently taking

## 2016-08-24 NOTE — Telephone Encounter (Signed)
sched appt next week with me, needs to bring aLL meds taking, I have a form to be signed and need to see all her meds, also needs f/u from recent hospitalization, flu vaccine etc

## 2016-08-28 ENCOUNTER — Other Ambulatory Visit: Payer: Self-pay | Admitting: Licensed Clinical Social Worker

## 2016-08-28 NOTE — Patient Outreach (Signed)
Assessment:  CSW spoke via phone with client. CSW verified client identity. CSW and client spoke of client needs. Cient sees Dr. Moshe Cipro as primary care doctor. Client is enrolled in [employer]).  Client receives support weekly as scheduled with home health aide through [facility].  Client receives mobile meals support (receives frozen meals every 2 weeks). . Daughter of client helps transport client to and from client's scheduled medical appointments.  Client and CSW spoke of client care plan. CSW encouraged client to communicate with CSW in next 30 days to discuss housing options for client in the area.  CSW has talked previously with client about [facility] apartments, [facility] apartments and about [facility] apartments in the local area. Client has back pain issues and is taking pain medication as prescribed.  Client said she receives Food Stamps benefit. She said she goes to local food pantry to seek food assistance.  Client resides at [facility]. She is dependent on others to help her with her transport needs. CSW thanked client for phone conversation with CSW. CSW encouraged client to call CSW at [phone] as needed to discuss social work needs of client.   Plan:  Client to communicate with CSW in next 30 days to discuss housing options for client in the area.  CSW to call client in 4 weeks to assess client needs.  [name].Zendayah Hardgrave MSW, LCSW Licensed Clinical Social Worker [facility] Care Management [phone]

## 2016-08-31 ENCOUNTER — Encounter: Payer: Self-pay | Admitting: Family Medicine

## 2016-08-31 ENCOUNTER — Ambulatory Visit (INDEPENDENT_AMBULATORY_CARE_PROVIDER_SITE_OTHER): Payer: Medicare Other | Admitting: Family Medicine

## 2016-08-31 VITALS — BP 220/120 | HR 106 | Resp 18 | Ht 61.0 in | Wt 188.0 lb

## 2016-08-31 DIAGNOSIS — Z91199 Patient's noncompliance with other medical treatment and regimen due to unspecified reason: Secondary | ICD-10-CM

## 2016-08-31 DIAGNOSIS — E785 Hyperlipidemia, unspecified: Secondary | ICD-10-CM | POA: Diagnosis not present

## 2016-08-31 DIAGNOSIS — E669 Obesity, unspecified: Secondary | ICD-10-CM | POA: Diagnosis not present

## 2016-08-31 DIAGNOSIS — E559 Vitamin D deficiency, unspecified: Secondary | ICD-10-CM

## 2016-08-31 DIAGNOSIS — F17219 Nicotine dependence, cigarettes, with unspecified nicotine-induced disorders: Secondary | ICD-10-CM

## 2016-08-31 DIAGNOSIS — I10 Essential (primary) hypertension: Secondary | ICD-10-CM | POA: Diagnosis not present

## 2016-08-31 DIAGNOSIS — E1169 Type 2 diabetes mellitus with other specified complication: Secondary | ICD-10-CM

## 2016-08-31 DIAGNOSIS — Z23 Encounter for immunization: Secondary | ICD-10-CM | POA: Diagnosis not present

## 2016-08-31 DIAGNOSIS — Z9119 Patient's noncompliance with other medical treatment and regimen: Secondary | ICD-10-CM

## 2016-08-31 LAB — GLUCOSE, POCT (MANUAL RESULT ENTRY): POC Glucose: 174 mg/dl — AB (ref 70–99)

## 2016-08-31 MED ORDER — CLONIDINE HCL 0.2 MG PO TABS: ORAL_TABLET | ORAL | 2 refills | Status: DC

## 2016-08-31 MED ORDER — AMLODIPINE BESYLATE 10 MG PO TABS: 10.0000 mg | ORAL_TABLET | Freq: Every day | ORAL | 1 refills | Status: DC

## 2016-08-31 NOTE — Patient Instructions (Addendum)
Annual exam in 6 to 7 weeks, call if you need me before  Flu vaccine today'   New additional medication for blood pressure, amlodipine 10 mg one in the morning , and clonidine 0.2 mg one in the eveniing   Lipid, hepatic, HBA1C, vit D today  pLS schedule mammogram getthis done , 2 years past due  pls work on smoking cessation, need to quit also drug use   You Can Quit Smoking If you are ready to quit smoking or are thinking about it, congratulations! You have chosen to help yourself be healthier and live longer! There are lots of different ways to quit smoking. Nicotine gum, nicotine patches, a nicotine inhaler, or nicotine nasal spray can help with physical craving. Hypnosis, support groups, and medicines help break the habit of smoking. TIPS TO GET OFF AND STAY OFF CIGARETTES  Learn to predict your moods. Do not let a bad situation be your excuse to have a cigarette. Some situations in your life might tempt you to have a cigarette.  Ask friends and co-workers not to smoke around you.  Make your home smoke-free.  Never have "just one" cigarette. It leads to wanting another and another. Remind yourself of your decision to quit.  On a card, make a list of your reasons for not smoking. Read it at least the same number of times a day as you have a cigarette. Tell yourself everyday, "I do not want to smoke. I choose not to smoke."  Ask someone at home or work to help you with your plan to quit smoking.  Have something planned after you eat or have a cup of coffee. Take a walk or get other exercise to perk you up. This will help to keep you from overeating.  Try a relaxation exercise to calm you down and decrease your stress. Remember, you may be tense and nervous the first two weeks after you quit. This will pass.  Find new activities to keep your hands busy. Play with a pen, coin, or rubber band. Doodle or draw things on paper.  Brush your teeth right after eating. This will help cut  down the craving for the taste of tobacco after meals. You can try mouthwash too.  Try gum, breath mints, or diet candy to keep something in your mouth. IF YOU SMOKE AND WANT TO QUIT:  Do not stock up on cigarettes. Never buy a carton. Wait until one pack is finished before you buy another.  Never carry cigarettes with you at work or at home.  Keep cigarettes as far away from you as possible. Leave them with someone else.  Never carry matches or a lighter with you.  Ask yourself, "Do I need this cigarette or is this just a reflex?"  Bet with someone that you can quit. Put cigarette money in a piggy bank every morning. If you smoke, you give up the money. If you do not smoke, by the end of the week, you keep the money.  Keep trying. It takes 21 days to change a habit!  Talk to your doctor about using medicines to help you quit. These include nicotine replacement gum, lozenges, or skin patches.   This information is not intended to replace advice given to you by your health care provider. Make sure you discuss any questions you have with your health care provider.   Document Released: 08/08/2009 Document Revised: 01/04/2012 Document Reviewed: 08/08/2009 Elsevier Interactive Patient Education [employer].

## 2016-08-31 NOTE — Assessment & Plan Note (Addendum)
Uncontrolled add amlodipine daily and  clonidine .one at bedtimes, unfortunately ongoing non compliance with medical treatment remains a major barrier to pt care. Sh e reports eating once daily and taking meds only when she eats

## 2016-09-01 LAB — LIPID PANEL
Cholesterol: 253 mg/dL — ABNORMAL HIGH (ref <200)
HDL: 53 mg/dL (ref >50)
LDL Cholesterol: 148 mg/dL — ABNORMAL HIGH
Total CHOL/HDL Ratio: 4.8 Ratio (ref <5.0)
Triglycerides: 261 mg/dL — ABNORMAL HIGH (ref <150)
VLDL: 52 mg/dL — ABNORMAL HIGH (ref <30)

## 2016-09-01 LAB — HEMOGLOBIN A1C
Hgb A1c MFr Bld: 9.3 % — ABNORMAL HIGH (ref <5.7)
Mean Plasma Glucose: 220 mg/dL

## 2016-09-01 LAB — HEPATIC FUNCTION PANEL
ALT: 14 U/L (ref 6–29)
AST: 16 U/L (ref 10–35)
Albumin: 4.3 g/dL (ref 3.6–5.1)
Alkaline Phosphatase: 65 U/L (ref 33–130)
Bilirubin, Direct: 0.1 mg/dL (ref <=0.2)
Indirect Bilirubin: 0.5 mg/dL (ref 0.2–1.2)
Total Bilirubin: 0.6 mg/dL (ref 0.2–1.2)
Total Protein: 6.9 g/dL (ref 6.1–8.1)

## 2016-09-01 LAB — VITAMIN D 25 HYDROXY (VIT D DEFICIENCY, FRACTURES): Vit D, 25-Hydroxy: 15 ng/mL — ABNORMAL LOW (ref 30–100)

## 2016-09-04 ENCOUNTER — Other Ambulatory Visit: Payer: Self-pay | Admitting: Family Medicine

## 2016-09-04 MED ORDER — ALBUTEROL SULFATE HFA 108 (90 BASE) MCG/ACT IN AERS: INHALATION_SPRAY | RESPIRATORY_TRACT | 3 refills | Status: DC

## 2016-09-10 DIAGNOSIS — J441 Chronic obstructive pulmonary disease with (acute) exacerbation: Secondary | ICD-10-CM | POA: Diagnosis not present

## 2016-09-12 DIAGNOSIS — I509 Heart failure, unspecified: Secondary | ICD-10-CM | POA: Diagnosis not present

## 2016-09-14 NOTE — Assessment & Plan Note (Addendum)
Despite the fact that pt receives pre packaged medication, I doubt that she is actually taking the medicine based on exam and lab work. She actually reports often not taking her medication  as she eats only once daily and does not take medication if she has not eaten,  I have triied to get her into assisted living in the past , she has refused at the last minute, she repeatedly demonstrates being incapable of caring for herslf, involvement o a family member may be beneficial, will pursue this at next visit if  possible

## 2016-09-14 NOTE — Progress Notes (Signed)
[name]     MRN: [id number]      DOB: 09-03-1966   HPI [name] is here for follow up and re-evaluation of chronic medical conditions, medication management and review of any available recent lab and radiology data.  Preventive health is updated, specifically  Cancer screening and Immunization.    There are no new concerns.  There are no specific complaints  Reports not taking medication as she should and not eating regularly or as she should. Reports poor housing conditions   ROS Denies recent fever or chills. Denies sinus pressure, nasal congestion, ear pain or sore throat. Denies chest congestion, productive cough or wheezing. Denies chest pains, palpitations and leg swelling Denies abdominal pain, nausea, vomiting,diarrhea or constipation.   Denies dysuria, frequency, hesitancy has  incontinence.  C/o lower extremity numbness, or tingling. C/o  depression, anxiety or insomnia. Denies skin break down or rash.   PE  BP (!) 220/120   Pulse (!) 106   Resp 18   Ht 5\' 1"  (1.549 m)   Wt 188 lb (85.3 kg)   SpO2 96%   BMI 35.52 kg/m   Patient alert and oriented and in no cardiopulmonary distress.  HEENT: No facial asymmetry, EOMI,   oropharynx pink and moist.  Neck supple no JVD, no mass.  Chest: Clear to auscultation bilaterally.  CVS: S1, S2 no murmurs, no S3.Regular rate.  ABD: Soft non tender.   Ext: No edema  MS: Adequate though reduced ROM spine, shoulders, hips and knees.  Skin: Intact, no ulcerations or rash noted.  Psych: Good eye contact, normal affect. Memory intact not anxious or depressed appearing.  CNS: CN 2-12 intact, power,  normal throughout.no focal deficits noted.   Assessment & Plan  HTN (hypertension), malignant Uncontrolled add amlodipine daily and  clonidine .one at bedtimes, unfortunately ongoing non compliance with medical treatment remains a major barrier to pt care. Sh e reports eating once daily and taking meds only  when she eats   Diabetes mellitus type 2 in obese- uncontrolled [name] is reminded of the importance of commitment to daily physical activity for 30 minutes or more, as able and the need to limit carbohydrate intake to 30 to 60 grams per meal to help with blood sugar control.   The need to take medication as prescribed, test blood sugar as directed, and to call between visits if there is a concern that blood sugar is uncontrolled is also discussed.  Uncontrolled , needs endo management, non compliant with medication and diet  [name] is reminded of the importance of daily foot exam, annual eye examination, and good blood sugar, blood pressure and cholesterol control.  Diabetic Labs Latest Ref Rng & Units 08/31/2016 07/23/2016 06/17/2016 05/29/2016 05/28/2016  HbA1c <5.7 % 9.3(H) - - - -  Microalbumin Not estab mg/dL - - - - -  Micro/Creat Ratio <30 mcg/mg creat - - - - -  Chol <200 mg/dL 253(H) - - - -  HDL >50 mg/dL 53 - - - -  Calc LDL mg/dL 148(H) - - - -  Triglycerides <150 mg/dL 261(H) - - - -  Creatinine 0.44 - 1.00 mg/dL - 1.43(H) 1.81(H) 1.57(H) 1.67(H)  GFR >60.00 mL/min - - - - -   BP/Weight 08/31/2016 07/28/2016 07/23/2016 06/18/2016 06/17/2016 05/29/2016 [id number]  Systolic BP XX123456 Q000111Q 99991111 123XX123 123456 Q000111Q 99991111  Diastolic BP 123456 98 123456 68 98 98 140  Wt. (Lbs) 188 - 193 188 188 196.67 196.12  BMI 35.52 - 36.47 35.52 35.52 37.16 37.08  Some encounter information is confidential and restricted. Go to Review Flowsheets activity to see all data.   Foot/eye exam completion dates Latest Ref Rng & Units 06/18/2016 05/07/2016  Eye Exam No Retinopathy - -  Foot exam Order - - -  Foot Form Completion - Done Done        Cigarette nicotine dependence with nicotine-induced disorder Patient counseled for approximately 5 minutes regarding the health risks of ongoing nicotine use, specifically all types of cancer, heart disease, stroke and respiratory failure. The options available for help with  cessation ,the behavioral changes to assist the process, and the option to either gradully reduce usage  Or abruptly stop.is also discussed. Pt is also encouraged to set specific goals in number of cigarettes used daily, as well as to set a quit date.     Morbid obesity ([place]) Improved slightly Patient re-educated about  the importance of commitment to a  minimum of 150 minutes of exercise per week.  The importance of healthy food choices with portion control discussed. Encouraged to start a food diary, count calories and to consider  joining a support group. Sample diet sheets offered. Goals set by the patient for the next several months.   Weight /BMI 08/31/2016 07/23/2016 06/18/2016  WEIGHT 188 lb 193 lb 188 lb  HEIGHT 5\' 1"  5\' 1"  5\' 1"   BMI 35.52 kg/m2 36.47 kg/m2 35.52 kg/m2  Some encounter information is confidential and restricted. Go to Review Flowsheets activity to see all data.      Medical non-compliance Despite the fact that pt receives pre packaged medication, I doubt that she is actually taking the medicine based on exam and lab work. She actually reports often not taking her medication  as she eats only once daily and does not take medication if she has not eaten,  I have triied to get her into assisted living in the past , she has refused at the last minute, she repeatedly demonstrates being incapable of caring for herslf, involvement o a family member may be beneficial, will pursue this at next visit if  possible

## 2016-09-14 NOTE — Assessment & Plan Note (Signed)
Improved slightly Patient re-educated about  the importance of commitment to a  minimum of 150 minutes of exercise per week.  The importance of healthy food choices with portion control discussed. Encouraged to start a food diary, count calories and to consider  joining a support group. Sample diet sheets offered. Goals set by the patient for the next several months.   Weight /BMI 08/31/2016 07/23/2016 06/18/2016  WEIGHT 188 lb 193 lb 188 lb  HEIGHT 5\' 1"  5\' 1"  5\' 1"   BMI 35.52 kg/m2 36.47 kg/m2 35.52 kg/m2  Some encounter information is confidential and restricted. Go to Review Flowsheets activity to see all data.

## 2016-09-14 NOTE — Assessment & Plan Note (Signed)
[name] is reminded of the importance of commitment to daily physical activity for 30 minutes or more, as able and the need to limit carbohydrate intake to 30 to 60 grams per meal to help with blood sugar control.   The need to take medication as prescribed, test blood sugar as directed, and to call between visits if there is a concern that blood sugar is uncontrolled is also discussed.  Uncontrolled , needs endo management, non compliant with medication and diet  [name] is reminded of the importance of daily foot exam, annual eye examination, and good blood sugar, blood pressure and cholesterol control.  Diabetic Labs Latest Ref Rng & Units 08/31/2016 07/23/2016 06/17/2016 05/29/2016 05/28/2016  HbA1c <5.7 % 9.3(H) - - - -  Microalbumin Not estab mg/dL - - - - -  Micro/Creat Ratio <30 mcg/mg creat - - - - -  Chol <200 mg/dL 253(H) - - - -  HDL >50 mg/dL 53 - - - -  Calc LDL mg/dL 148(H) - - - -  Triglycerides <150 mg/dL 261(H) - - - -  Creatinine 0.44 - 1.00 mg/dL - 1.43(H) 1.81(H) 1.57(H) 1.67(H)  GFR >60.00 mL/min - - - - -   BP/Weight 08/31/2016 07/28/2016 07/23/2016 06/18/2016 06/17/2016 05/29/2016 [id number]  Systolic BP XX123456 Q000111Q 99991111 123XX123 123456 Q000111Q 99991111  Diastolic BP 123456 98 123456 68 98 98 140  Wt. (Lbs) 188 - 193 188 188 196.67 196.12  BMI 35.52 - 36.47 35.52 35.52 37.16 37.08  Some encounter information is confidential and restricted. Go to Review Flowsheets activity to see all data.   Foot/eye exam completion dates Latest Ref Rng & Units 06/18/2016 05/07/2016  Eye Exam No Retinopathy - -  Foot exam Order - - -  Foot Form Completion - Done Done

## 2016-09-14 NOTE — Assessment & Plan Note (Signed)

## 2016-09-15 ENCOUNTER — Other Ambulatory Visit: Payer: Self-pay | Admitting: Family Medicine

## 2016-09-16 ENCOUNTER — Other Ambulatory Visit: Payer: Self-pay

## 2016-09-16 MED ORDER — AMLODIPINE BESYLATE 10 MG PO TABS: 10.0000 mg | ORAL_TABLET | Freq: Every day | ORAL | 2 refills | Status: DC

## 2016-09-16 MED ORDER — BISOPROLOL FUMARATE 5 MG PO TABS: ORAL_TABLET | ORAL | 2 refills | Status: DC

## 2016-09-28 ENCOUNTER — Other Ambulatory Visit: Payer: Self-pay | Admitting: Licensed Clinical Social Worker

## 2016-09-28 NOTE — Patient Outreach (Signed)
Assessment:  CSW spoke via phone with client. CSW verified client identity. CSW and client spoke of client needs. Client sees Dr. Moshe Cipro as primary care doctor.  Client receives Food Stamps benefit. CSW has talked with client about [facility] and given client the phone number  for agency and encouraged  client to call agency for food help for client. Client and CSW spoke of client care plan. CSW encouraged client to communicate with CSW in next 30 days to discuss housing options for client in the area.  Client is looking for an apartment in the area. She is receiving apartment applications in the mail and completing housing applications of choice. Client said she has support from her daughter. Client receives weekly support from [employer] home health aide. Client also receives Meals on Wheels frozen meals support every 2 weeks. Client has prescribed medications and is taking medications as prescribed.  Client said she is dependent on family and friends to help her with transport needs of client. Client said she is trying to attend scheduled client medical appointments.  CSW encouraged client to call CSW at [phone] as needed to address social work needs of client.      Plan:  Client to communicate with CSW in next 30 days to discuss housing options for client in the area.  CSW to call client in 4 weeks to assess client needs at that time.  [name].Lillyona Polasek MSW, LCSW Licensed Clinical Social Worker [facility] Care Management [phone]

## 2016-09-29 ENCOUNTER — Encounter (HOSPITAL_COMMUNITY): Payer: Self-pay | Admitting: *Deleted

## 2016-09-29 ENCOUNTER — Emergency Department (HOSPITAL_COMMUNITY): Payer: Medicare Other

## 2016-09-29 ENCOUNTER — Emergency Department (HOSPITAL_COMMUNITY)
Admission: EM | Admit: 2016-09-29 | Discharge: 2016-09-29 | Disposition: A | Discharge status: Home / Self Care | Payer: Medicare Other | Attending: Emergency Medicine | Admitting: Emergency Medicine

## 2016-09-29 DIAGNOSIS — N183 Chronic kidney disease, stage 3 (moderate): Secondary | ICD-10-CM | POA: Diagnosis not present

## 2016-09-29 DIAGNOSIS — E1122 Type 2 diabetes mellitus with diabetic chronic kidney disease: Secondary | ICD-10-CM | POA: Diagnosis not present

## 2016-09-29 DIAGNOSIS — F17210 Nicotine dependence, cigarettes, uncomplicated: Secondary | ICD-10-CM | POA: Insufficient documentation

## 2016-09-29 DIAGNOSIS — S4992XA Unspecified injury of left shoulder and upper arm, initial encounter: Secondary | ICD-10-CM | POA: Diagnosis present

## 2016-09-29 DIAGNOSIS — J449 Chronic obstructive pulmonary disease, unspecified: Secondary | ICD-10-CM | POA: Diagnosis not present

## 2016-09-29 DIAGNOSIS — Y999 Unspecified external cause status: Secondary | ICD-10-CM | POA: Diagnosis not present

## 2016-09-29 DIAGNOSIS — Y929 Unspecified place or not applicable: Secondary | ICD-10-CM | POA: Insufficient documentation

## 2016-09-29 DIAGNOSIS — Z79899 Other long term (current) drug therapy: Secondary | ICD-10-CM | POA: Diagnosis not present

## 2016-09-29 DIAGNOSIS — X503XXA Overexertion from repetitive movements, initial encounter: Secondary | ICD-10-CM | POA: Diagnosis not present

## 2016-09-29 DIAGNOSIS — I10 Essential (primary) hypertension: Secondary | ICD-10-CM

## 2016-09-29 DIAGNOSIS — Y9389 Activity, other specified: Secondary | ICD-10-CM | POA: Insufficient documentation

## 2016-09-29 DIAGNOSIS — I129 Hypertensive chronic kidney disease with stage 1 through stage 4 chronic kidney disease, or unspecified chronic kidney disease: Secondary | ICD-10-CM | POA: Insufficient documentation

## 2016-09-29 DIAGNOSIS — M79602 Pain in left arm: Secondary | ICD-10-CM | POA: Diagnosis not present

## 2016-09-29 DIAGNOSIS — S40022A Contusion of left upper arm, initial encounter: Secondary | ICD-10-CM | POA: Diagnosis not present

## 2016-09-29 MED ORDER — OXYCODONE-ACETAMINOPHEN 5-325 MG PO TABS
1.0000 | ORAL_TABLET | Freq: Once | ORAL | Status: AC
  Administered 2016-09-29: 1 via ORAL
  Filled 2016-09-29: qty 1

## 2016-09-29 MED ORDER — HYDROCODONE-ACETAMINOPHEN 7.5-325 MG PO TABS: 1.0000 | ORAL_TABLET | Freq: Four times a day (QID) | ORAL | 0 refills | Status: DC | PRN
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
## 2016-09-29 NOTE — ED Triage Notes (Signed)
Pt reports she was tucking her covers under on her bed and hurt a "pop" sound to her left arm. Pt woke up with large bruising and pain to left upper arm. Pt is on Plavix.

## 2016-09-29 NOTE — Discharge Instructions (Signed)
Wear the sling, call Dr. Ruthe Mannan office tomorrow to arrange a follow-up appt.  Also, follow-up with your primary doctor to schedule an appt with your primary doctor regarding your high blood pressure.

## 2016-10-01 ENCOUNTER — Other Ambulatory Visit: Payer: Self-pay | Admitting: Family Medicine

## 2016-10-10 DIAGNOSIS — J441 Chronic obstructive pulmonary disease with (acute) exacerbation: Secondary | ICD-10-CM | POA: Diagnosis not present

## 2016-10-12 DIAGNOSIS — I509 Heart failure, unspecified: Secondary | ICD-10-CM | POA: Diagnosis not present

## 2016-10-24 IMAGING — CT CT HEAD W/O CM
1 series · 15 of 30 positions shown, 19 images · non-contrast
Comparison: CT head March 17, 2014

CLINICAL DATA: Headache, hypertension this afternoon. History of
diabetes, hypertension, hyperlipidemia.

EXAM:
CT HEAD WITHOUT CONTRAST
TECHNIQUE: Contiguous axial images were obtained from the base of the skull
through the vertex without intravenous contrast.

[Series 2: headtrauma 4.8 h37s · axial · 0.43mm/px · z∈[+65,+200]mm · 15 of 30 slices shown, 19 images]
[im 2/30  brain]
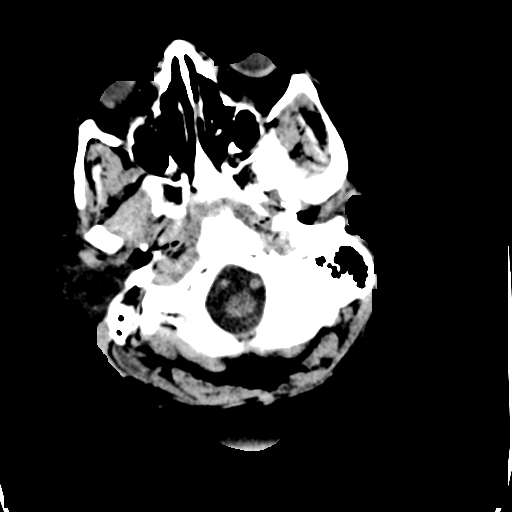
[im 2/30  bone]
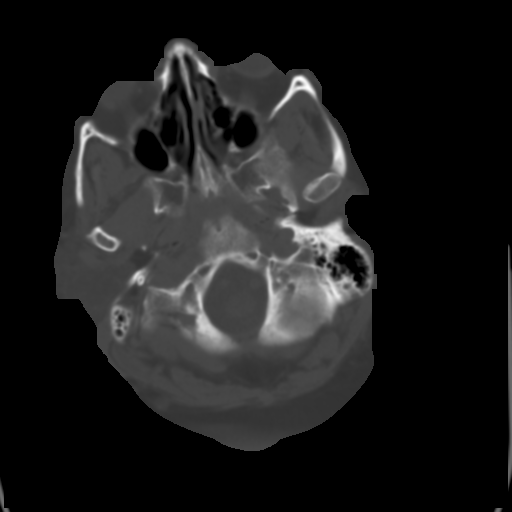
[im 4/30  brain]
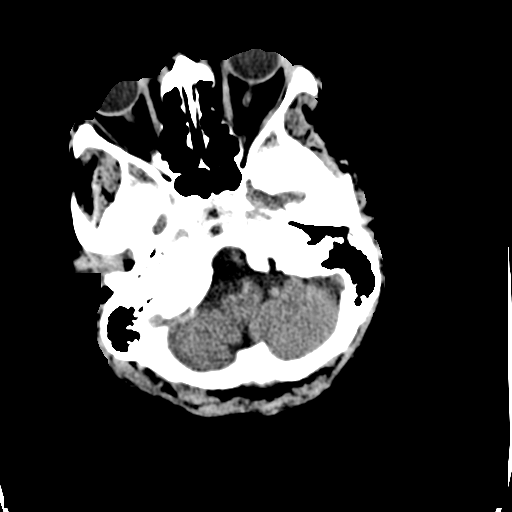
[im 6/30  brain]
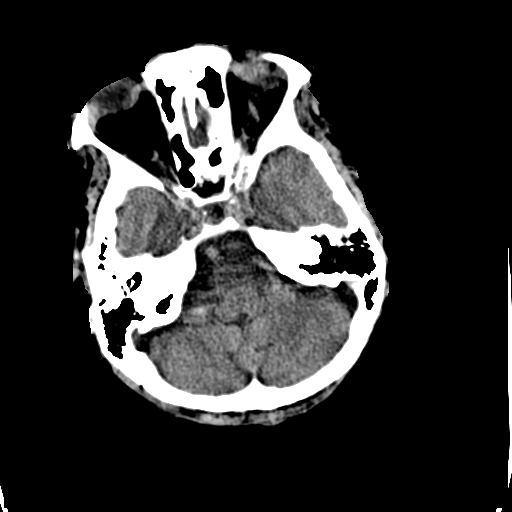
[im 8/30  brain]
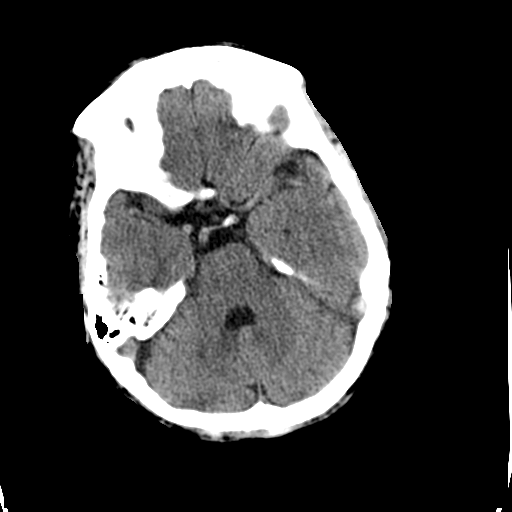
[im 10/30  brain]
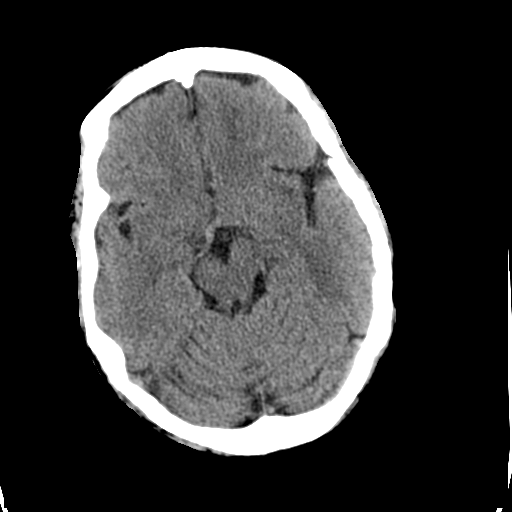
[im 10/30  bone]
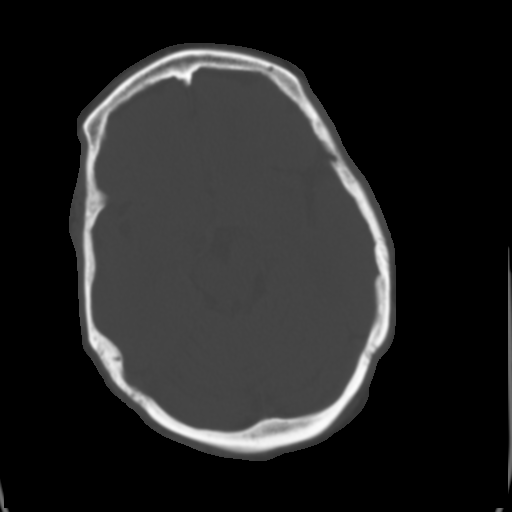
[im 12/30  brain]
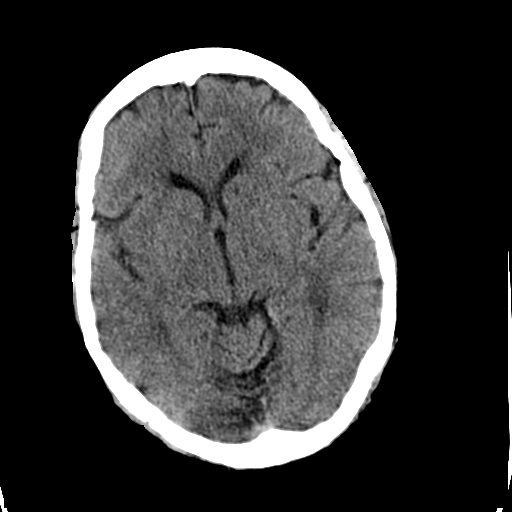
[im 14/30  brain]
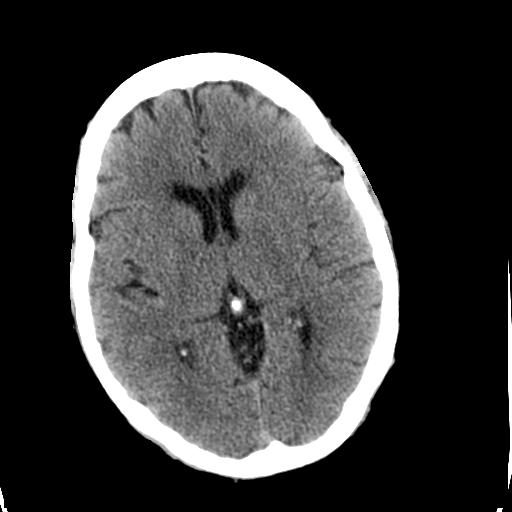
[im 16/30  brain]
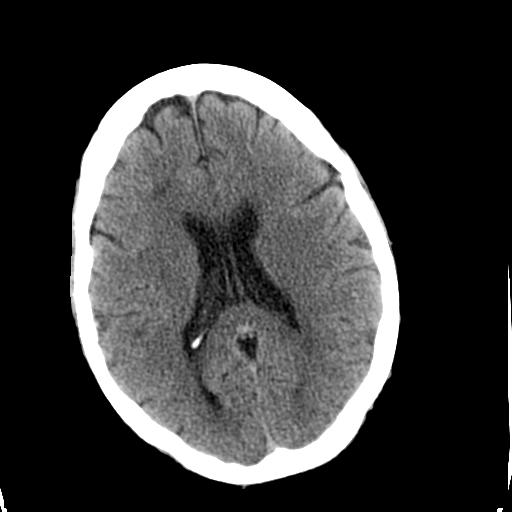
[im 17/30  brain]
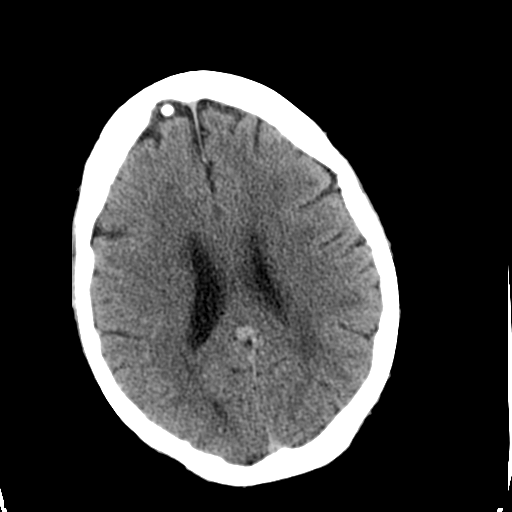
[im 17/30  bone]
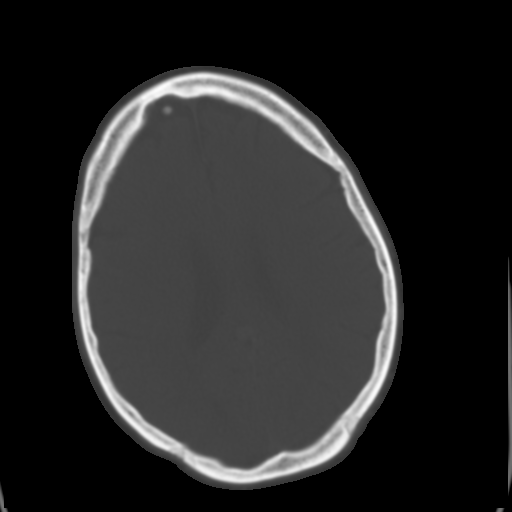
[im 19/30  brain]
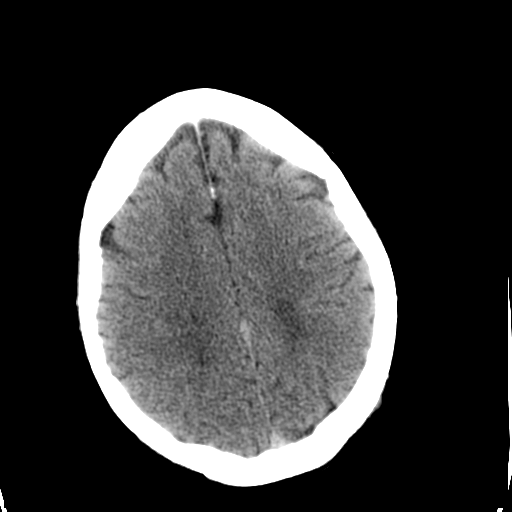
[im 21/30  brain]
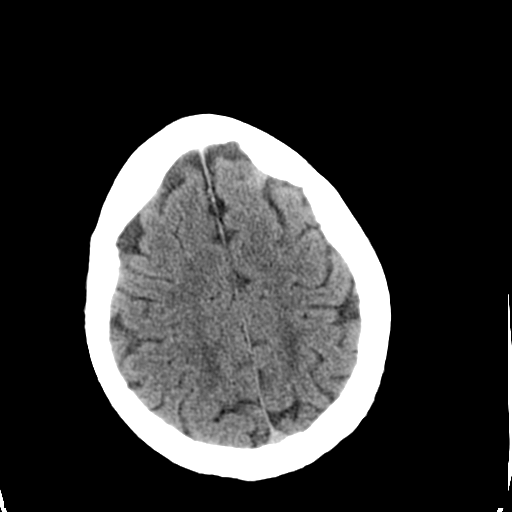
[im 23/30  brain]
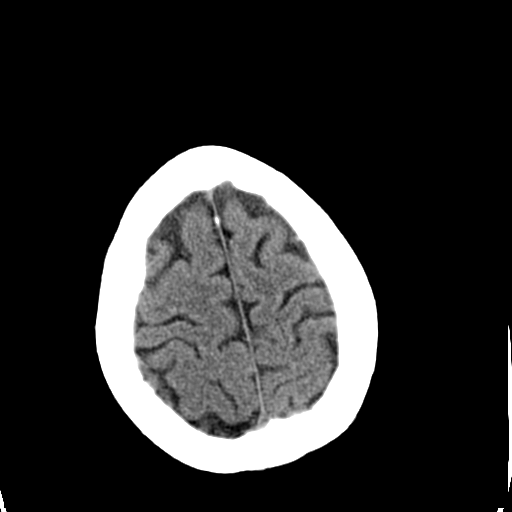
[im 25/30  brain]
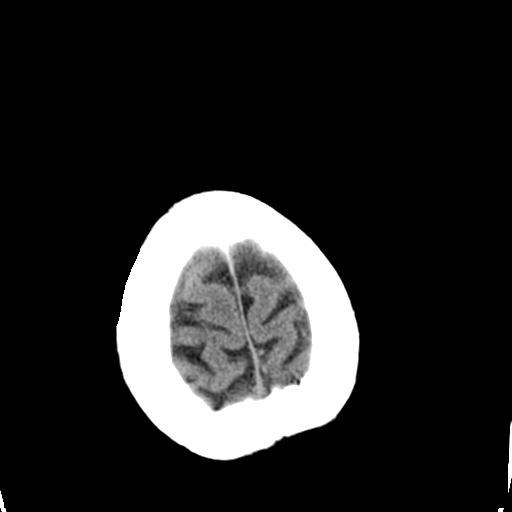
[im 25/30  bone]
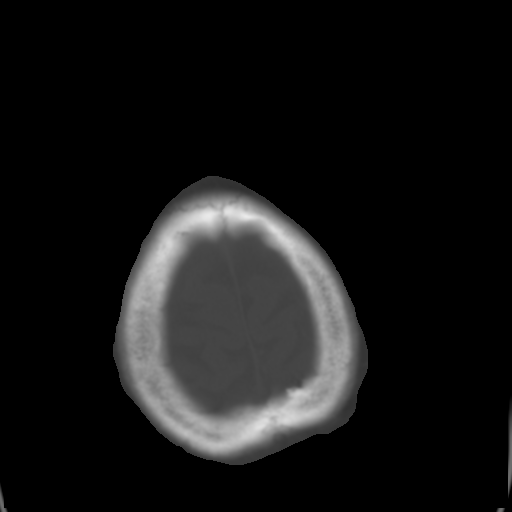
[im 27/30  brain]
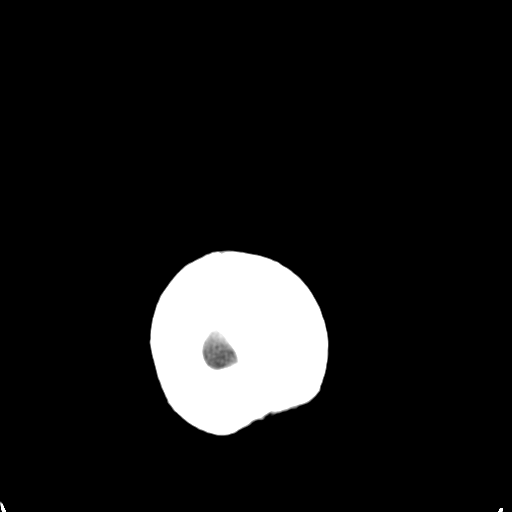
[im 29/30  brain]
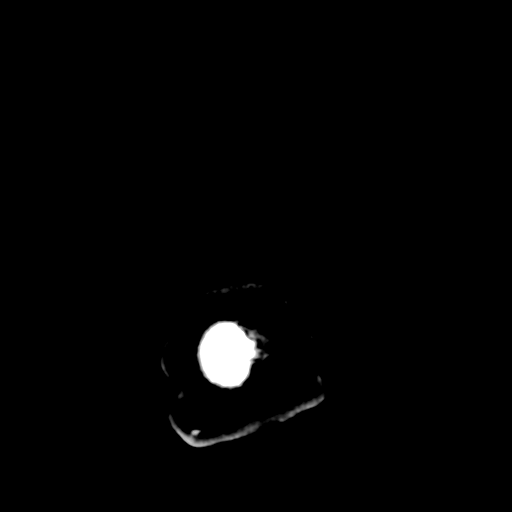

[15 of 30 positions shown; findings below may reference images not displayed]

FINDINGS: The ventricles and sulci are normal. No intraparenchymal hemorrhage,
mass effect nor midline shift. No acute large vascular territory
infarcts. Remote LEFT thalamus lacunar infarct. Subcentimeter
hypodensity LEFT caudate head may have been present on prior
imaging. Patchy white matter hypodensities are advanced for age
though, similar to prior CT. Subcentimeter density within the LEFT
frontal extra-axial space was present on prior CT and may reflect a
small meningioma or, prominent cortical vein. Mild calcific
atherosclerosis of the carotid siphons.

No abnormal extra-axial fluid collections. Basal cisterns are
patent.

No skull fracture. The included ocular globes and orbital contents
are non-suspicious. Remote LEFT medial orbital blowout fracture. The
mastoid aircells and included paranasal sinuses are well-aerated.
IMPRESSION: No acute intracranial process.

Moderate white matter changes, advanced for age compatible chronic
small vessel ischemic disease. Old LEFT thalamus lacunar infarct.
Likely old LEFT caudate head lacunar infarct.

## 2016-10-24 IMAGING — DX DG CHEST 2V
2 series · 2 of 2 positions shown · non-contrast
Comparison: Chest radiograph September 11, 2014

CLINICAL DATA: Hypertension, headache this afternoon. History of
diabetes, hypertension.

EXAM:
CHEST  2 VIEW

[chest pa]
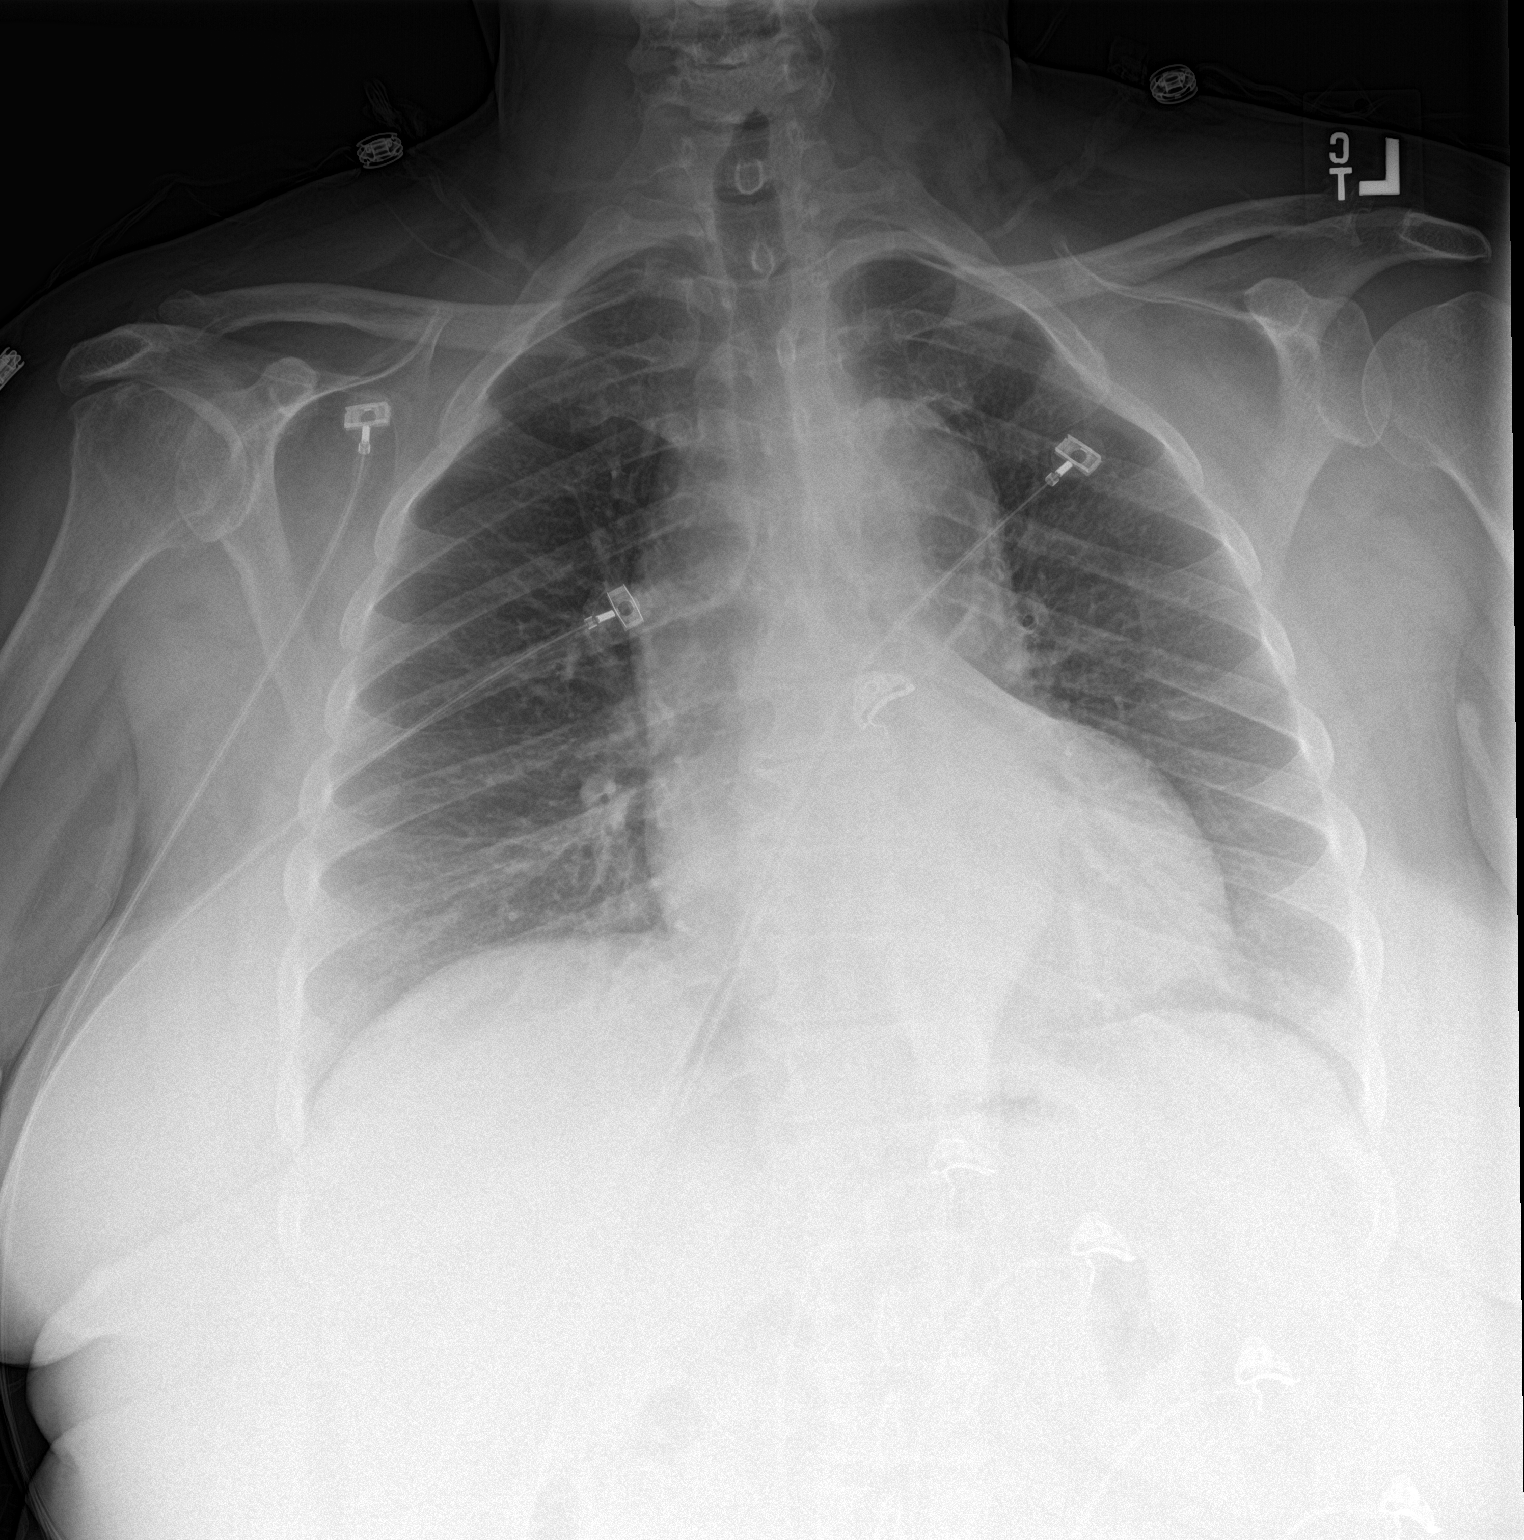

[chest lat]
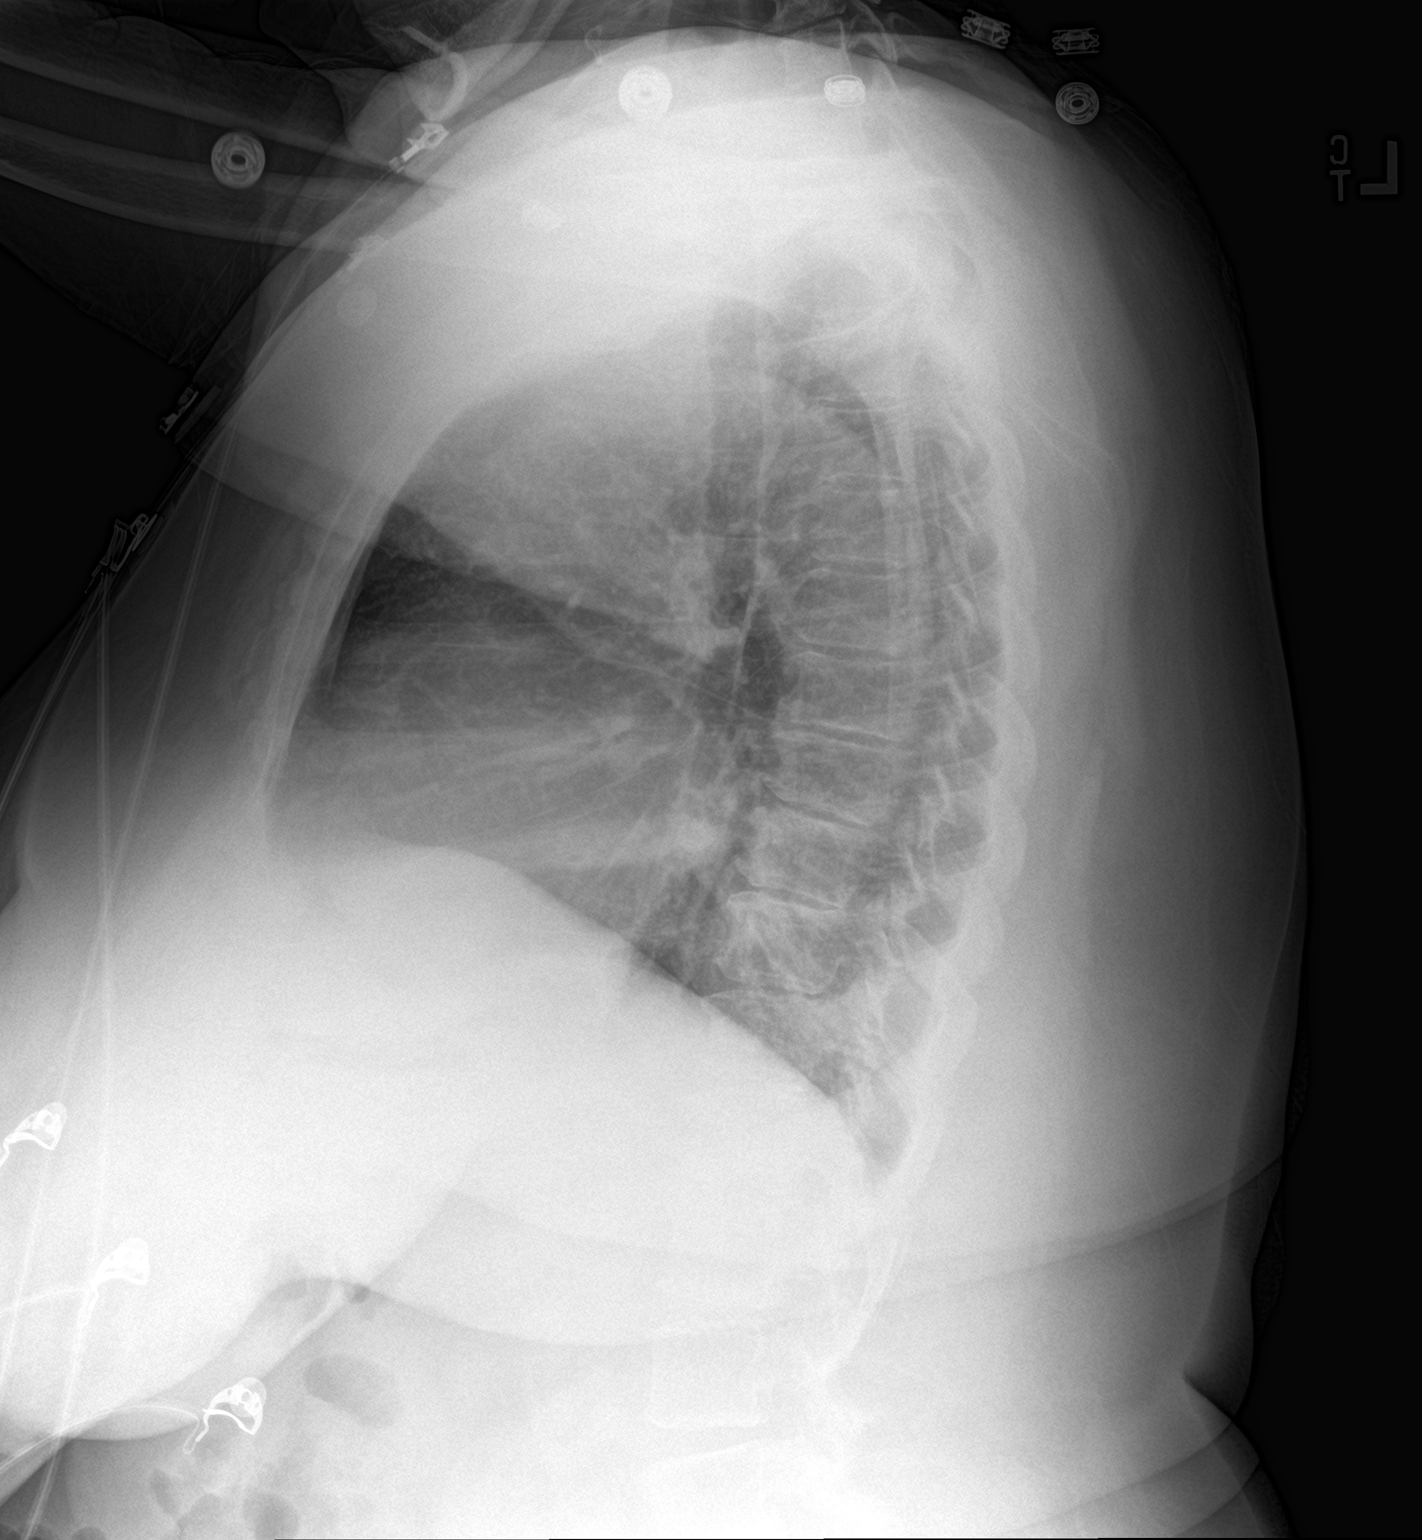

[2 of 2 positions shown; findings below may reference images not displayed]

FINDINGS: Cardiac silhouette is upper limits of normal in size. Tortuous aorta
can be seen with chronic hypertension. No pleural effusion or focal
consolidation. No pneumothorax. Large body habitus. Mild
degenerative change of the thoracic spine.
IMPRESSION: Borderline cardiomegaly, no acute pulmonary process.

## 2016-10-26 DIAGNOSIS — I639 Cerebral infarction, unspecified: Secondary | ICD-10-CM

## 2016-10-26 HISTORY — DX: Cerebral infarction, unspecified: I63.9

## 2016-10-29 ENCOUNTER — Other Ambulatory Visit: Payer: Self-pay | Admitting: Licensed Clinical Social Worker

## 2016-10-29 ENCOUNTER — Ambulatory Visit: Payer: Self-pay

## 2016-10-29 NOTE — Patient Outreach (Signed)
Assessment:  CSW spoke via phone with client. CSW verified client identity. Client sees Dr. Moshe Cipro as primary care doctor. Client receives Food Stamps benefit. CSW has communicated with client about [facility] and has given client phone number for [facility]. CSW has encouraged client to call [facility] to seek food assistance/support for client from that agency. CSW and client spoke of client care plan. CSW encouraged client to communicate with CSW in next 30 days to discuss housing options for client in the area. Client is looking for an apartment in the area. She is completing apartment applications of choice. She has some support from her daughter. Client receives weekly support as scheduled with [employer] [facility] home health aide. Client also receives Meals on Wheels frozen meals support every 2 weeks.  Client said she is dependent on family members or friends for transport assistance for client.  She said she is trying to attend scheduled client medical appointments. She said she had her prescribed medications and is taking medications as prescribed. She said she was taking pain medication as prescribed.  CSW encouraged client to call CSW at [phone] as needed to discuss social work needs of client.  CSW thanked client for phone call with CSW on 10/29/16.   Plan:  Client to communicate with CSW in next 30 days to discuss housing options for client in the area.  CSW to call client in 4 weeks to assess client needs at that time.  [name].Malayzia Laforte MSW, LCSW Licensed Clinical Social Worker [facility] Care Management [phone]

## 2016-11-09 ENCOUNTER — Other Ambulatory Visit: Payer: Self-pay | Admitting: Family Medicine

## 2016-11-10 DIAGNOSIS — J441 Chronic obstructive pulmonary disease with (acute) exacerbation: Secondary | ICD-10-CM | POA: Diagnosis not present

## 2016-11-12 ENCOUNTER — Ambulatory Visit: Payer: Self-pay

## 2016-11-12 DIAGNOSIS — I509 Heart failure, unspecified: Secondary | ICD-10-CM | POA: Diagnosis not present

## 2016-11-29 IMAGING — MR MR LUMBAR SPINE W/O CM
4 of 5 series · 14 of 48 positions shown · non-contrast
Comparison: Lumbar spine radiographs 01/04/2014. Abdominal MRI
05/21/2005.

CLINICAL DATA: Severe low back pain for 3 or 4 weeks. No acute
injury or prior relevant surgery. Initial encounter.

EXAM:
MRI LUMBAR SPINE WITHOUT CONTRAST
TECHNIQUE: Multiplanar, multisequence MR imaging of the lumbar spine was
performed. No intravenous contrast was administered.

[Series 3: T2 · sagittal · 4.0mm · 0.63mm/px · 5 of 13 slices shown (1 of 2)]
[im 1/13]
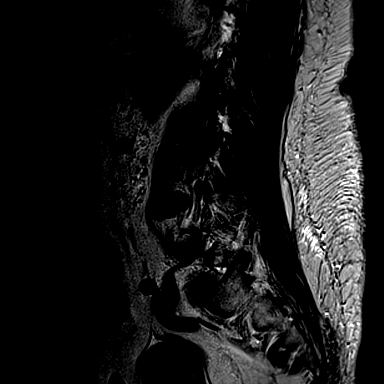
[im 4/13]
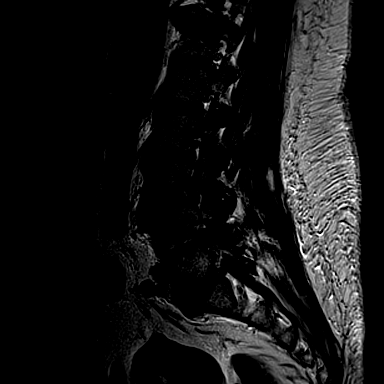
[im 7/13]
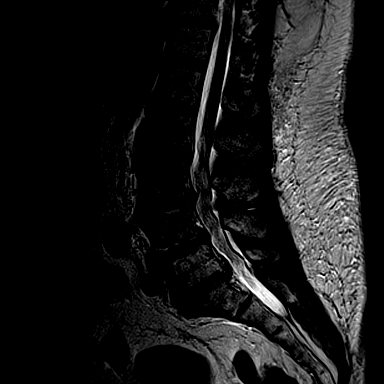
[im 10/13]
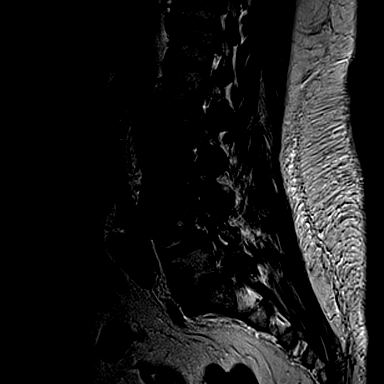
[im 13/13]
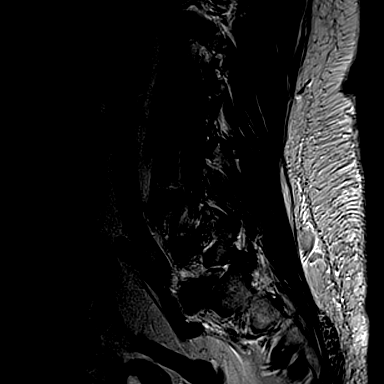

[Series 4: T1 · sagittal · 4.0mm · 0.34mm/px · 3 of 13 slices shown (1 of 2)]
[im 1/13]
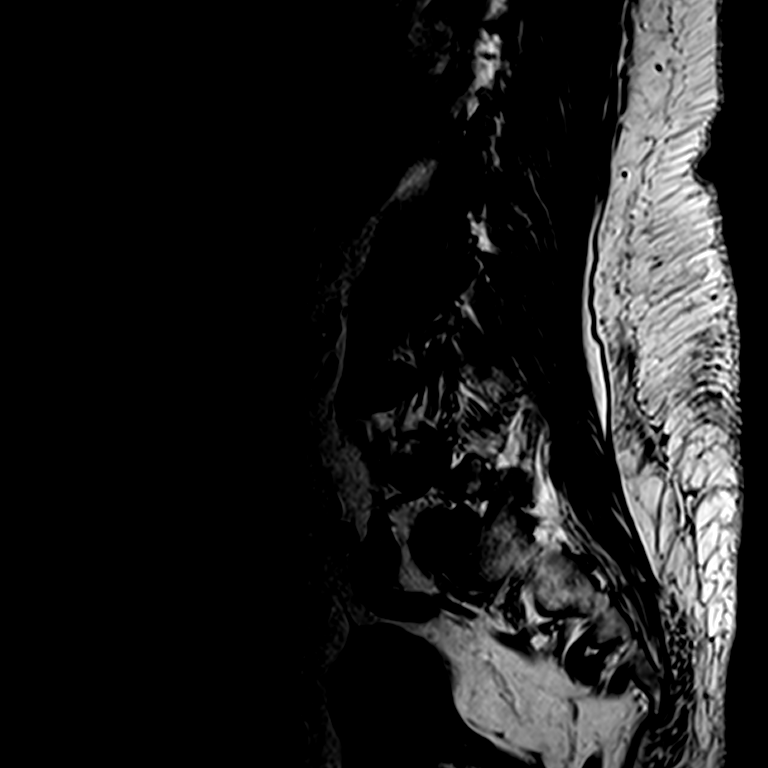
[im 7/13]
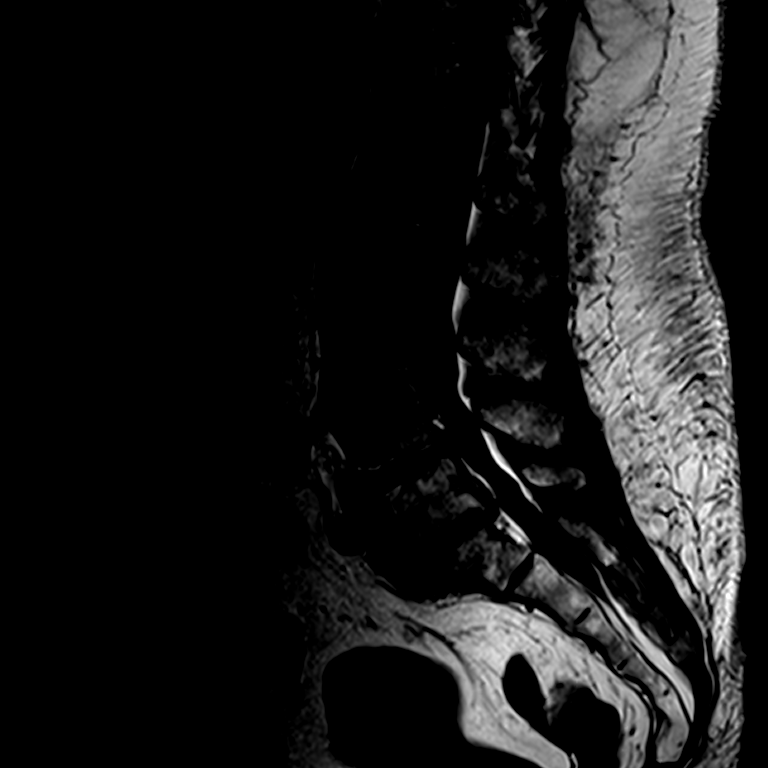
[im 13/13]
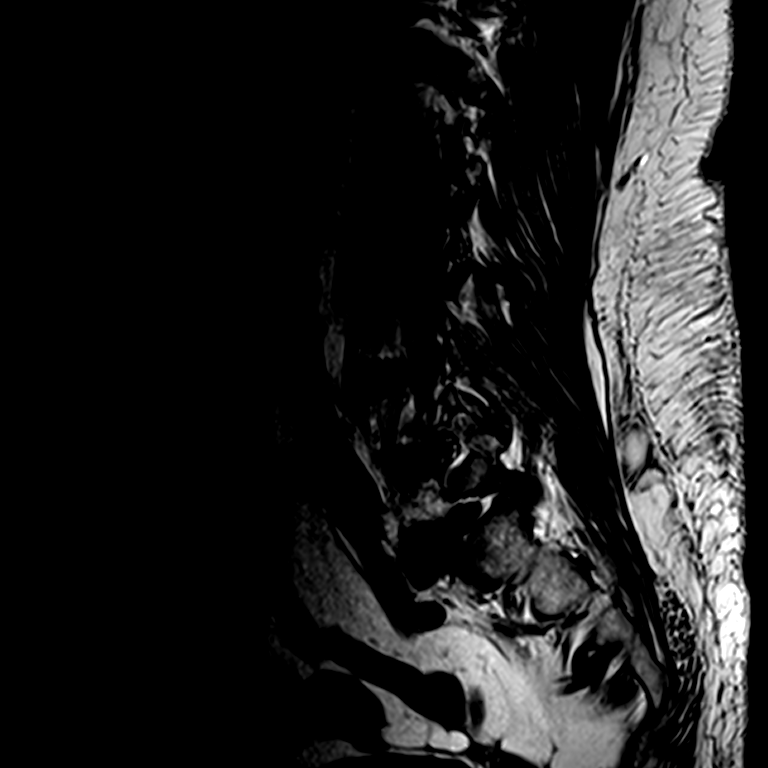

[Series 6: T2 · axial · 4.0mm · 0.19mm/px · z∈[-78,+50]mm · 3 of 36 slices shown (2 of 2)]
[im 5/36]
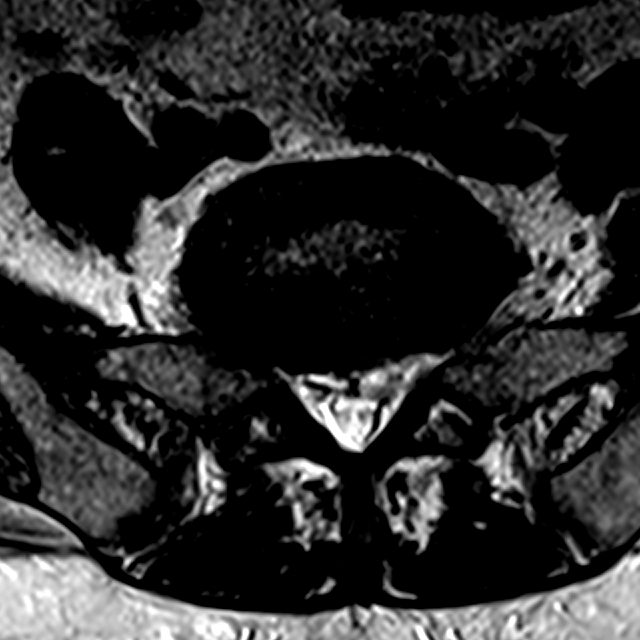
[im 19/36]
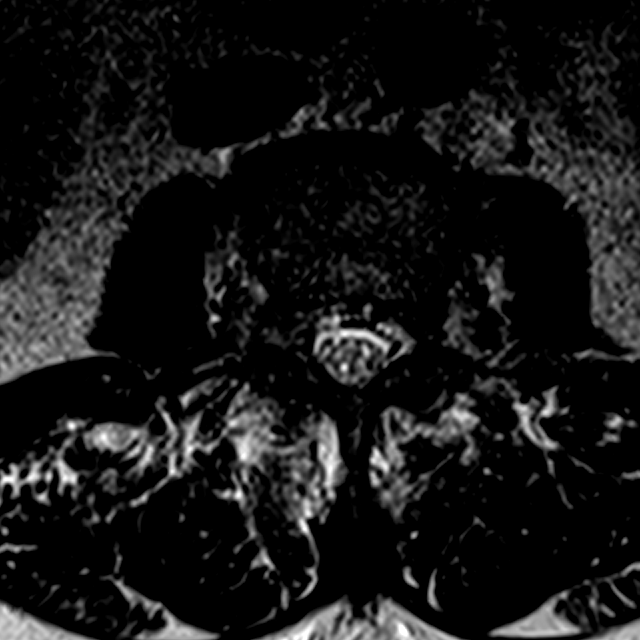
[im 31/36]
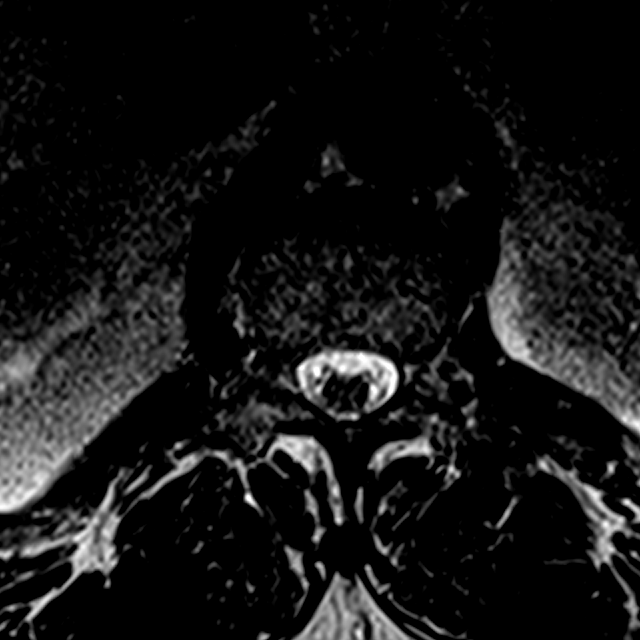

[Series 7: T1 · axial · 4.0mm · 0.20mm/px · z∈[-79,+50]mm · 3 of 36 slices shown (2 of 2)]
[im 5/36]
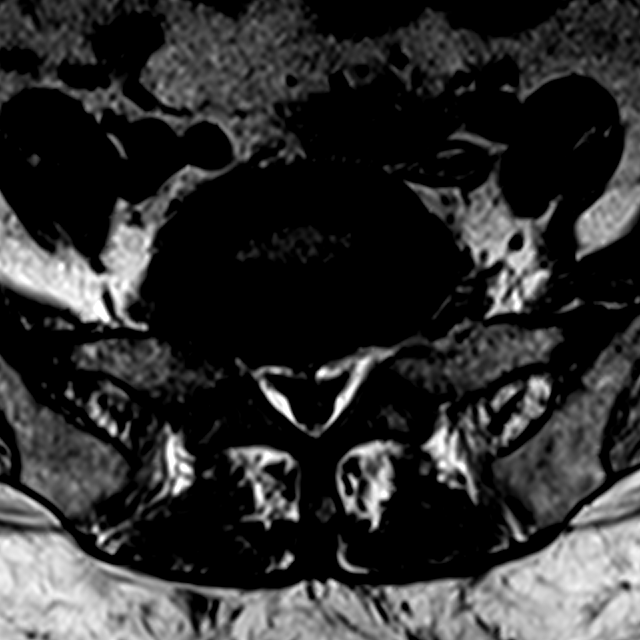
[im 19/36]
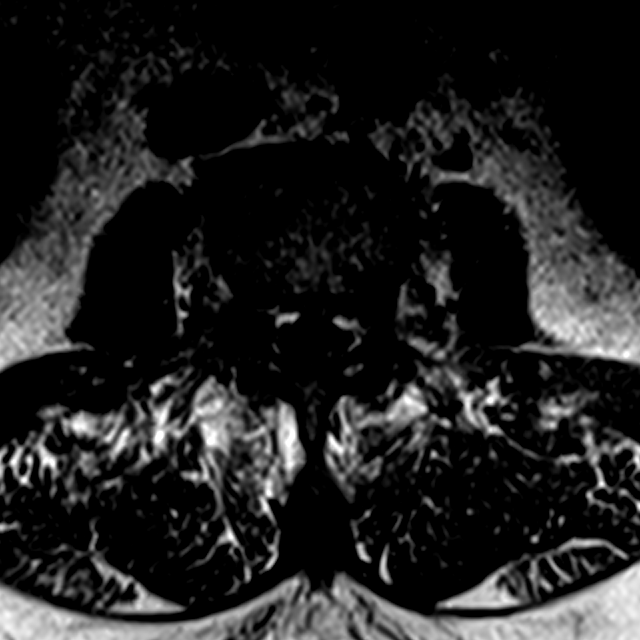
[im 31/36]
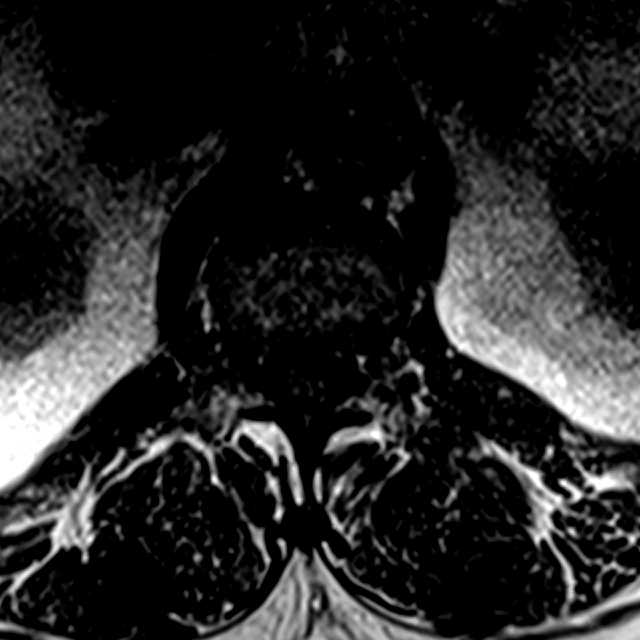

[14 of 48 positions shown; findings below may reference images not displayed]

FINDINGS: Prior studies demonstrate 5 lumbar type vertebral bodies. The
alignment is stable with a grade 1 anterolisthesis at L3-4 and L4-5.
No evidence of acute fracture or pars defect. There are endplate
degenerative changes at the lower 3 levels. The lumbar pedicles are
somewhat short on a congenital basis.

The conus medullaris extends to the L2 level and appears normal. No
paraspinal abnormalities are identified.Small uterine fibroids
noted.

Sagittal images demonstrate loss of disc height with annular disc
bulging at T11-12. There is mild resulting foraminal narrowing on
the left, but no cord deformity.

No significant findings at T12-L1 or L1-2.

L2-3: Mild disc bulging. No spinal stenosis or nerve root
encroachment.

L3-4: There is loss of disc height with annular disc bulging
asymmetric to the left and endplate degeneration. Asymmetric left
foraminal extrusion is best seen on the sagittal images,
contributing to moderate left foraminal narrowing and probable left
L3 nerve root encroachment. The right foramen appears mildly
narrowed. Moderate facet and ligamentous hypertrophy contribute to
mild spinal stenosis.

L4-5: Mild disc bulging with moderate facet and ligamentous
hypertrophy. Mild spinal stenosis with asymmetric narrowing of the
left lateral recess and mild left-greater-than-right foraminal
narrowing.

L5-S1: Chronic degenerative disc disease with loss of disc height,
annular disc bulging and endplate osteophytes. Mild bilateral facet
hypertrophy. Mild to moderate foraminal narrowing due to
osteophytes.
IMPRESSION: 1. Congenitally short pedicles.
2. Mild multifactorial spinal stenosis at L3-4. A left foraminal
disc extrusion contributes to asymmetric left foraminal narrowing
and probable left L3 nerve root encroachment. Does the patient have
left radicular symptoms?
3. Mild multifactorial spinal stenosis at L4-5 with mild narrowing
of the left lateral recess and both foramina.
4. Chronic disc degeneration at L5-S1 contributing to mild to
moderate foraminal narrowing bilaterally.
5. Mild left foraminal narrowing also noted at T11-12.

## 2016-11-30 ENCOUNTER — Other Ambulatory Visit: Payer: Self-pay | Admitting: Family Medicine

## 2016-11-30 DIAGNOSIS — E669 Obesity, unspecified: Principal | ICD-10-CM

## 2016-11-30 DIAGNOSIS — E1169 Type 2 diabetes mellitus with other specified complication: Secondary | ICD-10-CM

## 2016-11-30 MED ORDER — MONTELUKAST SODIUM 10 MG PO TABS: ORAL_TABLET | ORAL | 1 refills | Status: DC

## 2016-11-30 MED ORDER — CLONIDINE HCL 0.2 MG PO TABS: ORAL_TABLET | ORAL | 1 refills | Status: DC

## 2016-11-30 MED ORDER — TRAZODONE HCL 50 MG PO TABS: ORAL_TABLET | ORAL | 1 refills | Status: DC

## 2016-11-30 MED ORDER — ROSUVASTATIN CALCIUM 40 MG PO TABS: 40.0000 mg | ORAL_TABLET | Freq: Every day | ORAL | 1 refills | Status: DC

## 2016-11-30 MED ORDER — EZETIMIBE 10 MG PO TABS: 10.0000 mg | ORAL_TABLET | Freq: Every day | ORAL | 1 refills | Status: DC

## 2016-11-30 MED ORDER — VITAMIN D (ERGOCALCIFEROL) 1.25 MG (50000 UNIT) PO CAPS: 50000.0000 [IU] | ORAL_CAPSULE | ORAL | 0 refills | Status: DC

## 2016-11-30 MED ORDER — CLOPIDOGREL BISULFATE 75 MG PO TABS: 75.0000 mg | ORAL_TABLET | Freq: Every day | ORAL | 0 refills | Status: DC

## 2016-11-30 MED ORDER — GABAPENTIN 100 MG PO CAPS: ORAL_CAPSULE | ORAL | 1 refills | Status: DC

## 2016-11-30 MED ORDER — FENOFIBRIC ACID 135 MG PO CPDR: 1.0000 | DELAYED_RELEASE_CAPSULE | Freq: Every day | ORAL | 1 refills | Status: DC

## 2016-11-30 MED ORDER — FLUOXETINE HCL 20 MG PO CAPS: ORAL_CAPSULE | ORAL | 1 refills | Status: DC

## 2016-11-30 MED ORDER — REPAGLINIDE 1 MG PO TABS: ORAL_TABLET | ORAL | 1 refills | Status: DC

## 2016-11-30 MED ORDER — POTASSIUM CHLORIDE CRYS ER 20 MEQ PO TBCR: EXTENDED_RELEASE_TABLET | ORAL | 1 refills | Status: DC

## 2016-11-30 MED ORDER — HYDROXYZINE HCL 50 MG PO TABS: ORAL_TABLET | ORAL | 1 refills | Status: DC

## 2016-11-30 MED ORDER — PANTOPRAZOLE SODIUM 40 MG PO TBEC: 40.0000 mg | DELAYED_RELEASE_TABLET | Freq: Every day | ORAL | 1 refills | Status: DC

## 2016-11-30 MED ORDER — LINAGLIPTIN 5 MG PO TABS: 5.0000 mg | ORAL_TABLET | Freq: Every day | ORAL | 1 refills | Status: DC

## 2016-12-01 ENCOUNTER — Other Ambulatory Visit: Payer: Self-pay | Admitting: Licensed Clinical Social Worker

## 2016-12-01 NOTE — Patient Outreach (Signed)
Assessment:  CSW spoke via phone with client. CSW verified client identity. CSW and client spoke of client needs. Client sees Dr. Moshe Cipro as primary care doctor. Client has prescribed medications and is taking medications as prescribed. Client had received Food Stamps benefit. Client said she is now no longer getting Food Stamps benefit  Client said she does receive Meals s on Wheels frozen meals every 2 weeks. CSW has given client the name, address and phone number of [facility]. CSW has encouraged client to contact [facility] to seek food assistance/support for client.  CSW and client spoke of client care plan. CSW encouraged client to communicate with CSW in next 30 days to discuss housing options for client in the area. Client is looking for an apartment in the area. She has some support from her daughter.  Client receives weekly support as scheduled with [place].  Client said she does depend on family members or friends for transport assistance for client. Client said she is taking pain medication as prescribed.  Client said she is also aware of transport support with [place] She said she does use [facility] transport occasionally to take her to and from her scheduled medical appointments. She said she has a medical appointment scheduled with Dr. Moshe Cipro for 12/08/16. CSW encouraged client to call CSW at [phone] to discuss social work needs of client.  CSW again encouraged client to seek transport help in taking client to and from [facility] in order for client to seek food assistance from that agency.      Plan:  Client to communicate with CSW in next 30 days to discuss housing options for client in the area.  CSW to call client in 4 weeks to assess client needs at that time.  [name].Diesel Lina MSW, LCSW Licensed Clinical Social Worker [facility] Care  Management [phone]

## 2016-12-03 ENCOUNTER — Other Ambulatory Visit: Payer: Self-pay | Admitting: Family Medicine

## 2016-12-07 ENCOUNTER — Ambulatory Visit: Payer: Self-pay

## 2016-12-07 ENCOUNTER — Other Ambulatory Visit: Payer: Self-pay | Admitting: Family Medicine

## 2016-12-08 ENCOUNTER — Other Ambulatory Visit: Payer: Self-pay | Admitting: Family Medicine

## 2016-12-11 DIAGNOSIS — J441 Chronic obstructive pulmonary disease with (acute) exacerbation: Secondary | ICD-10-CM | POA: Diagnosis not present

## 2016-12-13 DIAGNOSIS — I509 Heart failure, unspecified: Secondary | ICD-10-CM | POA: Diagnosis not present

## 2016-12-15 ENCOUNTER — Ambulatory Visit (INDEPENDENT_AMBULATORY_CARE_PROVIDER_SITE_OTHER): Payer: Medicare Other

## 2016-12-15 VITALS — BP 176/100 | HR 94 | Temp 98.1°F | Ht 61.0 in | Wt 184.1 lb

## 2016-12-15 DIAGNOSIS — E1169 Type 2 diabetes mellitus with other specified complication: Secondary | ICD-10-CM | POA: Diagnosis not present

## 2016-12-15 DIAGNOSIS — Z1211 Encounter for screening for malignant neoplasm of colon: Secondary | ICD-10-CM | POA: Diagnosis not present

## 2016-12-15 DIAGNOSIS — Z0000 Encounter for general adult medical examination without abnormal findings: Secondary | ICD-10-CM | POA: Diagnosis not present

## 2016-12-15 DIAGNOSIS — Z1231 Encounter for screening mammogram for malignant neoplasm of breast: Secondary | ICD-10-CM | POA: Diagnosis not present

## 2016-12-15 DIAGNOSIS — E669 Obesity, unspecified: Secondary | ICD-10-CM

## 2016-12-15 DIAGNOSIS — Z1239 Encounter for other screening for malignant neoplasm of breast: Secondary | ICD-10-CM

## 2016-12-15 NOTE — Patient Instructions (Addendum)
[name] , Thank you for taking time to come for your Medicare Wellness Visit. I appreciate your ongoing commitment to your health goals. Please review the following plan we discussed and let me know if I can assist you in the future.   Screening recommendations: You are due for your mammogram, colonoscopy, and diabetic eye exam. We will schedule these for you and call you with your appointments.  Abnormal screenings: None  Patient concerns: Pain in her back  Nurse concerns: Not living in a home. A community resource referral was sent to our care guide [name]. She will be in contact with you within the next month to  assist you with housing, food assistance, transportation, smoking cessation classes, counseling, and a shower chair  Next appt: Follow up with Dr. Moshe Cipro on 12/22/2016 at 11:00 am with Dr. Moshe Cipro for back pain. Follow up in 1 year for your annual wellness visit.   Advance directive discussed with patient today. Copy provided for patient to complete at home and have notarized. Patient agrees to have copy sent to our office once it is complete.   Preventive Care 40-64 Years, Female Preventive care refers to lifestyle choices and visits with your health care provider that can promote health and wellness. What does preventive care include?  A yearly physical exam. This is also called an annual well check.  Dental exams once or twice a year.  Routine eye exams. Ask your health care provider how often you should have your eyes checked.  Personal lifestyle choices, including:  Daily care of your teeth and gums.  Regular physical activity.  Eating a healthy diet.  Avoiding tobacco and drug use.  Limiting alcohol use.  Practicing safe sex.  Taking low-dose aspirin daily starting at age 64.  Taking vitamin and mineral supplements as recommended by your health care provider. What happens during an annual well check? The services and screenings done by your health care  provider during your annual well check will depend on your age, overall health, lifestyle risk factors, and family history of disease. Counseling  Your health care provider may ask you questions about your:  Alcohol use.  Tobacco use.  Drug use.  Emotional well-being.  Home and relationship well-being.  Sexual activity.  Eating habits.  Work and work Statistician.  Method of birth control.  Menstrual cycle.  Pregnancy history. Screening  You may have the following tests or measurements:  Height, weight, and BMI.  Blood pressure.  Lipid and cholesterol levels. These may be checked every 5 years, or more frequently if you are over 86 years old.  Skin check.  Lung cancer screening. You may have this screening every year starting at age 52 if you have a 30-pack-year history of smoking and currently smoke or have quit within the past 15 years.  Fecal occult blood test (FOBT) of the stool. You may have this test every year starting at age 14.  Flexible sigmoidoscopy or colonoscopy. You may have a sigmoidoscopy every 5 years or a colonoscopy every 10 years starting at age 35.  Hepatitis C blood test.  Diabetes screening. This is done by checking your blood sugar (glucose) after you have not eaten for a while (fasting). You may have this done every 1-3 years.  Mammogram. This may be done every 1-2 years. Talk to your health care provider about when you should start having regular mammograms. This may depend on whether you have a family history of breast cancer.  BRCA-related cancer screening. This  may be done if you have a family history of breast, ovarian, tubal, or peritoneal cancers.  Pelvic exam and Pap test. This may be done every 3 years starting at age 33. Starting at age 55, this may be done every 5 years if you have a Pap test in combination with an HPV test.  Bone density scan. This is done to screen for osteoporosis. You may have this scan if you are at high risk  for osteoporosis. Discuss your test results, treatment options, and if necessary, the need for more tests with your health care provider. Vaccines  Your health care provider may recommend certain vaccines, such as:  Influenza vaccine. This is recommended every year.  Tetanus, diphtheria, and acellular pertussis (Tdap, Td) vaccine. You may need a Td booster every 10 years.  Varicella vaccine. You may need this if you have not been vaccinated.  Zoster vaccine. You may need this after age 86.  Pneumococcal 13-valent conjugate (PCV13) vaccine. You may need this if you have certain conditions and were not previously vaccinated.  Pneumococcal polysaccharide (PPSV23) vaccine. You may need one or two doses if you smoke cigarettes or if you have certain conditions.  Talk to your health care provider about which screenings and vaccines you need and how often you need them. This information is not intended to replace advice given to you by your health care provider. Make sure you discuss any questions you have with your health care provider. Document Released: 11/08/2015 Document Revised: 07/01/2016 Document Reviewed: 08/13/2015 Elsevier Interactive Patient Education  2017 [employer].  Steps to Quit Smoking Smoking tobacco can be bad for your health. It can also affect almost every organ in your body. Smoking puts you and people around you at risk for many serious long-lasting (chronic) diseases. Quitting smoking is hard, but it is one of the best things that you can do for your health. It is never too late to quit. What are the benefits of quitting smoking? When you quit smoking, you lower your risk for getting serious diseases and conditions. They can include:  Lung cancer or lung disease.  Heart disease.  Stroke.  Heart attack.  Not being able to have children (infertility).  Weak bones (osteoporosis) and broken bones (fractures). If you have coughing, wheezing, and shortness of  breath, those symptoms may get better when you quit. You may also get sick less often. If you are pregnant, quitting smoking can help to lower your chances of having a baby of low birth weight. What can I do to help me quit smoking? Talk with your doctor about what can help you quit smoking. Some things you can do (strategies) include:  Quitting smoking totally, instead of slowly cutting back how much you smoke over a period of time.  Going to in-person counseling. You are more likely to quit if you go to many counseling sessions.  Using resources and support systems, such as:  Online chats with a Social worker.  Phone quitlines.  Printed Furniture conservator/restorer.  Support groups or group counseling.  Text messaging programs.  Mobile phone apps or applications.  Taking medicines. Some of these medicines may have nicotine in them. If you are pregnant or breastfeeding, do not take any medicines to quit smoking unless your doctor says it is okay. Talk with your doctor about counseling or other things that can help you. Talk with your doctor about using more than one strategy at the same time, such as taking medicines while you are also  going to in-person counseling. This can help make quitting easier. What things can I do to make it easier to quit? Quitting smoking might feel very hard at first, but there is a lot that you can do to make it easier. Take these steps:  Talk to your family and friends. Ask them to support and encourage you.  Call phone quitlines, reach out to support groups, or work with a Social worker.  Ask people who smoke to not smoke around you.  Avoid places that make you want (trigger) to smoke, such as:  Bars.  Parties.  Smoke-break areas at work.  Spend time with people who do not smoke.  Lower the stress in your life. Stress can make you want to smoke. Try these things to help your stress:  Getting regular exercise.  Deep-breathing  exercises.  Yoga.  Meditating.  Doing a body scan. To do this, close your eyes, focus on one area of your body at a time from head to toe, and notice which parts of your body are tense. Try to relax the muscles in those areas.  Download or buy apps on your mobile phone or tablet that can help you stick to your quit plan. There are many free apps, such as QuitGuide from the [employer] Office manager for Disease Control and Prevention). You can find more support from smokefree.gov and other websites. This information is not intended to replace advice given to you by your health care provider. Make sure you discuss any questions you have with your health care provider. Document Released: 08/08/2009 Document Revised: 06/09/2016 Document Reviewed: 02/26/2015 Elsevier Interactive Patient Education  2017 [employer].

## 2016-12-15 NOTE — Progress Notes (Signed)
Subjective:   [name] is a 51 y.o. female who presents for Medicare Annual (Subsequent) preventive examination.  Review of Systems: N/A Cardiac Risk Factors include: diabetes mellitus;dyslipidemia;hypertension;obesity (BMI >30kg/m2);sedentary lifestyle;smoking/ tobacco exposure     Objective:     Vitals: LMP 05/19/2016   There is no height or weight on file to calculate BMI.   Tobacco History  Smoking Status  . Current Every Day Smoker  . Packs/day: 1.00  . Years: 20.00  . Types: Cigarettes  . Last attempt to quit: 06/03/2016  Smokeless Tobacco  . Never Used     Ready to quit: Yes Counseling given: Yes   Past Medical History:  Diagnosis Date  . Alcohol use    Now abstinent.  . Ankle fracture, lateral malleolus, closed 12/30/2011  . Anxiety   . Breast mass, left 12/15/2011  . CKD (chronic kidney disease)   . COPD (chronic obstructive pulmonary disease) ([place])   . Diabetes mellitus, type 2 ([place])   . Diabetic Charcot foot ([place]) 12/30/2011  . Essential hypertension   . History of GI bleed 12/05/2009   Qualifier: Diagnosis of  By: Zeb Comfort    . Hyperlipidemia   . Hypertensive cardiomyopathy ([place]) 11/08/2012   Ejection fraction 40-45%.  . Noncompliance with medication regimen   . Obesity   . Panic attacks   . Tobacco abuse    Past Surgical History:  Procedure Laterality Date  . BREAST BIOPSY    . CARDIAC CATHETERIZATION N/A 07/28/2016   Procedure: Left Heart Cath and Coronary Angiography;  Surgeon: Jettie Booze, MD;  Location: [facility];  Service: Cardiovascular;  Laterality: N/A;  . DILATION AND CURETTAGE OF UTERUS     Family History  Problem Relation Age of Onset  . Hypertension Mother   . Heart attack Mother   . Hypertension Father     CABG   . Hypertension Sister   . Hypertension Brother   . Hypertension Sister   . Cancer Sister     breast   . Arthritis    . Cancer    . Diabetes    . Asthma    . Hypertension Daughter   .  Hypertension Son    History  Sexual Activity  . Sexual activity: Not Currently  . Birth control/ protection: None    Outpatient Encounter Prescriptions as of 12/15/2016  Medication Sig  . amLODipine (NORVASC) 10 MG tablet TAKE 1 TABLET BY MOUTH ONCE A DAY.  Marland Kitchen azelastine (ASTELIN) 0.1 % nasal spray USE 2 SPRAYS IN EACH NOSTRIL TWICE DAILY.  . benazepril (LOTENSIN) 40 MG tablet TAKE 1 TABLET BY MOUTH ONCE A DAY.  . bisoprolol (ZEBETA) 5 MG tablet TAKE 3 TABLETS (15mg ) BY MOUTH ONCE DAILY.  Marland Kitchen Choline Fenofibrate (FENOFIBRIC ACID) 135 MG CPDR Take 1 tablet by mouth daily.  . cloNIDine (CATAPRES) 0.2 MG tablet TAKE (1) TABLET BY MOUTH AT BEDTIME FOR BLOOD PRESSURE.  . clopidogrel (PLAVIX) 75 MG tablet Take 1 tablet (75 mg total) by mouth daily with breakfast.  . cycloSPORINE (RESTASIS) 0.05 % ophthalmic emulsion Place 1 drop into both eyes 2 (two) times daily.  Marland Kitchen ezetimibe (ZETIA) 10 MG tablet Take 1 tablet (10 mg total) by mouth daily.  Marland Kitchen FLUoxetine (PROZAC) 20 MG capsule TAKE 1 CAPSULE BY MOUTH EVERY DAY.  . fluticasone (FLONASE) 50 MCG/ACT nasal spray USE 2 SPRAYS IN EACH NOSTRIL DAILY.  . furosemide (LASIX) 20 MG tablet Take 20 mg daily. Dose decreased 8/25 (Patient taking  differently: Take 20 mg by mouth daily. )  . gabapentin (NEURONTIN) 100 MG capsule TAKE 1 CAPSULE BY MOUTH THREE TIMES A DAY.  . hydrOXYzine (ATARAX/VISTARIL) 50 MG tablet TAKE (1) TABLET BY MOUTH AT BEDTIME.  Marland Kitchen ipratropium-albuterol (DUONEB) 0.5-2.5 (3) MG/3ML SOLN Take 3 mLs by nebulization every 6 (six) hours as needed. (Patient taking differently: Take 3 mLs by nebulization every 6 (six) hours as needed (for wheezing/shortness of breath). )  . linagliptin (TRADJENTA) 5 MG TABS tablet Take 1 tablet (5 mg total) by mouth daily.  . montelukast (SINGULAIR) 10 MG tablet TAKE (1) TABLET BY MOUTH AT BEDTIME.  . pantoprazole (PROTONIX) 40 MG tablet Take 1 tablet (40 mg total) by mouth daily.  . potassium chloride SA  (K-DUR,KLOR-CON) 20 MEQ tablet TAKE 1&1/2 TABLET BY MOUTH DAILY.  Marland Kitchen PROAIR HFA 108 (90 Base) MCG/ACT inhaler INHALE 2 PUFFS EVERY 6 HOURS AS NEEDED FOR SHORTNESS OF BREATH/WHEEZING.  . repaglinide (PRANDIN) 1 MG tablet TAKE 1 TABLET BY MOUTH 3 TIMES DAILY BEFORE MEALS.  . rosuvastatin (CRESTOR) 40 MG tablet Take 1 tablet (40 mg total) by mouth daily.  Marland Kitchen SPIRIVA HANDIHALER 18 MCG inhalation capsule Place 18 mcg into inhaler and inhale daily.   Marland Kitchen spironolactone (ALDACTONE) 25 MG tablet 1 tablet twice a day  . SYMBICORT 80-4.5 MCG/ACT inhaler INHALE 2 PUFFS TWICE DAILY.  . tizanidine (ZANAFLEX) 2 MG capsule Take 1 capsule (2 mg total) by mouth 2 (two) times daily. (Patient taking differently: Take 2 mg by mouth 2 (two) times daily as needed for muscle spasms. )  . traMADol (ULTRAM) 50 MG tablet TAKE 1 TABLET BY MOUTH TWICE A DAY.  . traZODone (DESYREL) 50 MG tablet TAKE (1) TABLET BY MOUTH AT BEDTIME.  Marland Kitchen Vitamin D, Ergocalciferol, (DRISDOL) 50000 units CAPS capsule Take 1 capsule (50,000 Units total) by mouth once a week.  . [DISCONTINUED] HYDROcodone-acetaminophen (NORCO) 7.5-325 MG tablet Take 1 tablet by mouth every 6 (six) hours as needed for moderate pain.   No facility-administered encounter medications on file as of 12/15/2016.     Activities of Daily Living In your present state of health, do you have any difficulty performing the following activities: 12/15/2016 07/28/2016  Hearing? N N  Vision? Y N  Difficulty concentrating or making decisions? N N  Walking or climbing stairs? Y Y  Dressing or bathing? Y Y  Doing errands, shopping? Y -  Conservation officer, nature and eating ? N -  Using the Toilet? N -  In the past six months, have you accidently leaked urine? Y -  Do you have problems with loss of bowel control? N -  Managing your Medications? N -  Managing your Finances? N -  Housekeeping or managing your Housekeeping? Y -  Some recent data might be hidden    Patient Care Team: [name], MD as PCP - General Katha Cabal, LCSW as [place] Management (Licensed Clinical Social Worker) Madelin Headings, DO (Optometry)    Assessment:    Exercise Activities and Dietary recommendations Current Exercise Habits: The patient does not participate in regular exercise at present (recommend silver sneakers), Exercise limited by: Other - see comments (pain)  Goals    . <enter goal here>          "my main problems are my breathing and my diabetes"    . Exercise 3x per week (30 min per time)          Recommend exercising 3 times  a week for at least 30 minutes at a time.       Fall Risk Fall Risk  12/15/2016 06/24/2016 06/18/2016  Falls in the past year? No No No  Risk for fall due to : - Medication side effect -   Depression Screen PHQ 2/9 Scores 12/15/2016 06/24/2016 06/18/2016 09/03/2015  PHQ - 2 Score 3 2 2 1   PHQ- 9 Score 10 5 3 7      Cognitive Function   Normal 6CIT Screen 12/15/2016  What Year? 0 points  What month? 0 points  What time? 0 points  Count back from 20 0 points  Months in reverse 0 points  Repeat phrase 0 points  Total Score 0    Immunization History  Administered Date(s) Administered  . H1N1 11/07/2008  . Influenza Split 07/12/2012  . Influenza Whole 07/03/2007, 07/13/2007, 07/24/2008, 09/26/2009, 07/17/2010  . Influenza,inj,Quad PF,36+ Mos 08/28/2013, 09/10/2014, 09/03/2015, 08/31/2016  . Pneumococcal Polysaccharide-23 08/25/2006, 11/12/2012  . Td 09/08/2005  . Tdap 03/15/2012   Screening Tests Health Maintenance  Topic Date Due  . OPHTHALMOLOGY EXAM  03/25/2016  . MAMMOGRAM  07/31/2016  . COLONOSCOPY  08/23/2016  . HEMOGLOBIN A1C  02/28/2017  . FOOT EXAM  06/18/2017  . PAP SMEAR  05/08/2019  . TETANUS/TDAP  03/15/2022  . PNEUMOCOCCAL POLYSACCHARIDE VACCINE  Completed  . INFLUENZA VACCINE  Completed  . Hepatitis C Screening  Completed  . HIV Screening  Completed      Plan:  I have personally reviewed  and addressed the Medicare Annual Wellness questionnaire and have noted the following in the patient's chart:  A. Medical and social history B. Use of alcohol, tobacco or illicit drugs  C. Current medications and supplements D. Functional ability and status E.  Nutritional status F.  Physical activity G. Advance directives H. List of other physicians I.  Hospitalizations, surgeries, and ER visits in previous 12 months J.  [place] to include cognitive, depression, and falls L. Referrals and appointments - [employer] referral sent today. Referral was sent to Dr. Barney Drain for colonoscopy, as well as a referral to podiatry.  In addition, I have reviewed and discussed with patient certain preventive protocols, quality metrics, and best practice recommendations. A written personalized care plan for preventive services as well as general preventive health recommendations were provided to patient.  Signed,   Stormy Fabian, LPN Lead Nurse Health Advisor

## 2016-12-17 ENCOUNTER — Other Ambulatory Visit: Payer: Self-pay | Admitting: Family Medicine

## 2016-12-17 MED ORDER — ALBUTEROL SULFATE HFA 108 (90 BASE) MCG/ACT IN AERS: INHALATION_SPRAY | RESPIRATORY_TRACT | 5 refills | Status: DC

## 2016-12-17 MED ORDER — BUDESONIDE-FORMOTEROL FUMARATE 80-4.5 MCG/ACT IN AERO: 2.0000 | INHALATION_SPRAY | Freq: Two times a day (BID) | RESPIRATORY_TRACT | 5 refills | Status: DC

## 2016-12-21 ENCOUNTER — Other Ambulatory Visit: Payer: Self-pay | Admitting: Family Medicine

## 2016-12-21 DIAGNOSIS — Z1231 Encounter for screening mammogram for malignant neoplasm of breast: Secondary | ICD-10-CM

## 2016-12-22 ENCOUNTER — Telehealth: Payer: Self-pay | Admitting: Gastroenterology

## 2016-12-22 ENCOUNTER — Other Ambulatory Visit: Payer: Self-pay | Admitting: Family Medicine

## 2016-12-22 ENCOUNTER — Ambulatory Visit: Payer: Self-pay | Admitting: Family Medicine

## 2016-12-22 NOTE — Telephone Encounter (Signed)
Letter mailed

## 2016-12-22 NOTE — Telephone Encounter (Signed)
RECALL FOR TCS °

## 2016-12-29 ENCOUNTER — Other Ambulatory Visit: Payer: Self-pay | Admitting: Family Medicine

## 2016-12-30 ENCOUNTER — Ambulatory Visit (HOSPITAL_COMMUNITY): Payer: Self-pay

## 2016-12-31 ENCOUNTER — Encounter: Payer: Self-pay | Admitting: Licensed Clinical Social Worker

## 2016-12-31 ENCOUNTER — Other Ambulatory Visit: Payer: Self-pay | Admitting: Licensed Clinical Social Worker

## 2016-12-31 NOTE — Patient Outreach (Signed)
Assessment:  CSW spoke via phone with client. CSW verified client identity. CSW and client spoke of client needs. Client said she had her prescribed medications and was taking medications as prescribed. She continues to see Dr. Moshe Cipro as primary care doctor. CSW informed client on 12/31/16 that client had met care plan goals for client with [facility] CSW services. Thus, CSW informed client on 12/31/16 that [facility] would discharge client from [place] services on 12/31/16 (since client had met care plan goals with CSW services). Client agreed to this plan. [place] was very Programmer, applications [facility] program support in recent months. She was appreciative of support she had received in recent months from [place] on reaching her care plan goals with [facility] CSW services.   Plan:  CSW is discharging [name] from [facility] CSW services on 12/31/16 since client has met care plan goals for client with [facility] CSW services.  CSW to inform Josepha Pigg that [facility] discharged client on 12/31/16 from [place] services.  CSW to fax physician case closure letter to Dr. Moshe Cipro informing Dr. Moshe Cipro that [facility] discharged client from [facility] CSW services on 12/31/16.  [name].Alayla Dethlefs MSW, LCSW Licensed Clinical Social Worker [facility]: [place] [facility] Care Management [phone]

## 2017-01-08 ENCOUNTER — Ambulatory Visit (HOSPITAL_COMMUNITY): Payer: Self-pay

## 2017-01-08 DIAGNOSIS — J441 Chronic obstructive pulmonary disease with (acute) exacerbation: Secondary | ICD-10-CM | POA: Diagnosis not present

## 2017-01-10 DIAGNOSIS — I509 Heart failure, unspecified: Secondary | ICD-10-CM | POA: Diagnosis not present

## 2017-01-11 ENCOUNTER — Other Ambulatory Visit: Payer: Self-pay | Admitting: Family Medicine

## 2017-01-15 ENCOUNTER — Telehealth: Payer: Self-pay

## 2017-01-15 NOTE — Telephone Encounter (Signed)
pts insurance did a pharmacy consult with pt. She was SOB when they spoke, and they were inquiring if you could increase the dose of her symbicort?

## 2017-01-18 ENCOUNTER — Other Ambulatory Visit: Payer: Self-pay | Admitting: Family Medicine

## 2017-01-18 MED ORDER — BUDESONIDE-FORMOTEROL FUMARATE 160-4.5 MCG/ACT IN AERO: 2.0000 | INHALATION_SPRAY | Freq: Two times a day (BID) | RESPIRATORY_TRACT | 12 refills | Status: DC

## 2017-01-18 NOTE — Telephone Encounter (Signed)
Dose increase is printed pls fax, and notify patient and pharmacy

## 2017-01-19 NOTE — Telephone Encounter (Signed)
Have tried to call pt , no answer, no answering machine.

## 2017-01-21 ENCOUNTER — Telehealth: Payer: Self-pay

## 2017-01-21 NOTE — Telephone Encounter (Signed)
-----  Message from [name], MD sent at 12/15/2016  4:04 PM EST ----- Regarding: pls order labs for pt to get if possible tomorrow, has 2/22 appt Fasting lipid, cmp and eGFR and hBA1C Since unreliable if she does not come on 2/22, will need to mail lab order she needs these asap

## 2017-01-29 ENCOUNTER — Other Ambulatory Visit: Payer: Self-pay | Admitting: Cardiovascular Disease

## 2017-01-29 ENCOUNTER — Other Ambulatory Visit: Payer: Self-pay | Admitting: Family Medicine

## 2017-01-29 ENCOUNTER — Other Ambulatory Visit: Payer: Self-pay

## 2017-01-29 MED ORDER — FUROSEMIDE 20 MG PO TABS: 20.0000 mg | ORAL_TABLET | Freq: Every day | ORAL | 3 refills | Status: DC

## 2017-01-29 MED ORDER — SPIRONOLACTONE 25 MG PO TABS
25.0000 mg | ORAL_TABLET | Freq: Two times a day (BID) | ORAL | 3 refills | Status: DC
Start: 2017-01-29 — End: 2017-05-31

## 2017-01-29 NOTE — Telephone Encounter (Signed)
Called to rx care lasix and spironolactone

## 2017-02-04 ENCOUNTER — Ambulatory Visit (INDEPENDENT_AMBULATORY_CARE_PROVIDER_SITE_OTHER): Payer: Self-pay | Admitting: Orthopedic Surgery

## 2017-02-05 ENCOUNTER — Other Ambulatory Visit: Payer: Self-pay | Admitting: Family Medicine

## 2017-02-08 ENCOUNTER — Other Ambulatory Visit: Payer: Self-pay

## 2017-02-08 DIAGNOSIS — J441 Chronic obstructive pulmonary disease with (acute) exacerbation: Secondary | ICD-10-CM | POA: Diagnosis not present

## 2017-02-08 MED ORDER — FLUTICASONE PROPIONATE 50 MCG/ACT NA SUSP: 2.0000 | Freq: Every day | NASAL | 1 refills | Status: DC

## 2017-02-10 DIAGNOSIS — I509 Heart failure, unspecified: Secondary | ICD-10-CM | POA: Diagnosis not present

## 2017-02-16 ENCOUNTER — Other Ambulatory Visit: Payer: Self-pay | Admitting: Cardiovascular Disease

## 2017-02-22 ENCOUNTER — Ambulatory Visit (INDEPENDENT_AMBULATORY_CARE_PROVIDER_SITE_OTHER): Payer: Medicare Other | Admitting: Orthopedic Surgery

## 2017-03-01 ENCOUNTER — Other Ambulatory Visit: Payer: Self-pay | Admitting: Family Medicine

## 2017-03-02 MED ORDER — VITAMIN D (ERGOCALCIFEROL) 1.25 MG (50000 UNIT) PO CAPS: 50000.0000 [IU] | ORAL_CAPSULE | ORAL | 1 refills | Status: DC

## 2017-03-08 ENCOUNTER — Ambulatory Visit (INDEPENDENT_AMBULATORY_CARE_PROVIDER_SITE_OTHER): Payer: Self-pay | Admitting: Orthopedic Surgery

## 2017-03-12 DIAGNOSIS — I509 Heart failure, unspecified: Secondary | ICD-10-CM | POA: Diagnosis not present

## 2017-03-15 ENCOUNTER — Encounter: Payer: Self-pay | Admitting: Family Medicine

## 2017-03-16 ENCOUNTER — Other Ambulatory Visit: Payer: Self-pay | Admitting: Family Medicine

## 2017-03-16 DIAGNOSIS — E11610 Type 2 diabetes mellitus with diabetic neuropathic arthropathy: Secondary | ICD-10-CM

## 2017-03-16 DIAGNOSIS — E669 Obesity, unspecified: Principal | ICD-10-CM

## 2017-03-16 DIAGNOSIS — E1169 Type 2 diabetes mellitus with other specified complication: Secondary | ICD-10-CM

## 2017-03-16 LAB — HM DIABETES EYE EXAM

## 2017-03-16 NOTE — Progress Notes (Signed)
amb pod  

## 2017-03-30 ENCOUNTER — Ambulatory Visit: Payer: Self-pay | Admitting: Family Medicine

## 2017-03-30 ENCOUNTER — Other Ambulatory Visit: Payer: Self-pay | Admitting: Family Medicine

## 2017-04-02 ENCOUNTER — Other Ambulatory Visit: Payer: Self-pay

## 2017-04-02 MED ORDER — AZELASTINE HCL 0.1 % NA SOLN: 2.0000 | Freq: Two times a day (BID) | NASAL | 5 refills | Status: DC

## 2017-04-03 ENCOUNTER — Other Ambulatory Visit: Payer: Self-pay | Admitting: Family Medicine

## 2017-04-03 DIAGNOSIS — E11610 Type 2 diabetes mellitus with diabetic neuropathic arthropathy: Secondary | ICD-10-CM

## 2017-04-05 ENCOUNTER — Ambulatory Visit (HOSPITAL_COMMUNITY): Payer: Self-pay

## 2017-04-12 DIAGNOSIS — I509 Heart failure, unspecified: Secondary | ICD-10-CM | POA: Diagnosis not present

## 2017-04-16 ENCOUNTER — Telehealth: Payer: Self-pay

## 2017-04-16 DIAGNOSIS — E669 Obesity, unspecified: Secondary | ICD-10-CM

## 2017-04-16 DIAGNOSIS — E1169 Type 2 diabetes mellitus with other specified complication: Secondary | ICD-10-CM

## 2017-04-16 DIAGNOSIS — I10 Essential (primary) hypertension: Secondary | ICD-10-CM

## 2017-04-16 NOTE — Telephone Encounter (Signed)
Labs ordered.

## 2017-04-26 ENCOUNTER — Other Ambulatory Visit: Payer: Self-pay | Admitting: Family Medicine

## 2017-04-26 ENCOUNTER — Other Ambulatory Visit: Payer: Self-pay

## 2017-04-26 MED ORDER — LINAGLIPTIN 5 MG PO TABS: 5.0000 mg | ORAL_TABLET | Freq: Every day | ORAL | 1 refills | Status: DC

## 2017-04-26 MED ORDER — PANTOPRAZOLE SODIUM 40 MG PO TBEC: 40.0000 mg | DELAYED_RELEASE_TABLET | Freq: Every day | ORAL | 1 refills | Status: DC

## 2017-04-26 MED ORDER — FLUOXETINE HCL 20 MG PO CAPS: ORAL_CAPSULE | ORAL | 1 refills | Status: DC

## 2017-04-26 MED ORDER — MONTELUKAST SODIUM 10 MG PO TABS: ORAL_TABLET | ORAL | 1 refills | Status: DC

## 2017-04-26 MED ORDER — ROSUVASTATIN CALCIUM 40 MG PO TABS: 40.0000 mg | ORAL_TABLET | Freq: Every day | ORAL | 1 refills | Status: DC

## 2017-04-26 MED ORDER — BISOPROLOL FUMARATE 5 MG PO TABS: 5.0000 mg | ORAL_TABLET | Freq: Every day | ORAL | 0 refills | Status: DC

## 2017-04-26 MED ORDER — HYDROXYZINE HCL 50 MG PO TABS: ORAL_TABLET | ORAL | 0 refills | Status: DC

## 2017-04-26 MED ORDER — POTASSIUM CHLORIDE CRYS ER 20 MEQ PO TBCR: EXTENDED_RELEASE_TABLET | ORAL | 1 refills | Status: DC

## 2017-04-26 MED ORDER — GABAPENTIN 100 MG PO CAPS: ORAL_CAPSULE | ORAL | 1 refills | Status: DC

## 2017-04-26 MED ORDER — CHOLINE FENOFIBRATE 135 MG PO CPDR: 135.0000 mg | DELAYED_RELEASE_CAPSULE | Freq: Every day | ORAL | 1 refills | Status: DC

## 2017-04-26 MED ORDER — EZETIMIBE 10 MG PO TABS: 10.0000 mg | ORAL_TABLET | Freq: Every day | ORAL | 1 refills | Status: DC

## 2017-04-26 MED ORDER — TRAZODONE HCL 50 MG PO TABS: ORAL_TABLET | ORAL | 1 refills | Status: DC

## 2017-04-26 NOTE — Telephone Encounter (Signed)
Seen [phone]

## 2017-05-11 ENCOUNTER — Ambulatory Visit: Payer: Self-pay | Admitting: Family Medicine

## 2017-05-12 DIAGNOSIS — I509 Heart failure, unspecified: Secondary | ICD-10-CM | POA: Diagnosis not present

## 2017-05-21 ENCOUNTER — Telehealth: Payer: Self-pay

## 2017-05-21 NOTE — Telephone Encounter (Signed)
I called pt and asked her to call out office to make appt.  LMOM

## 2017-05-31 ENCOUNTER — Other Ambulatory Visit: Payer: Self-pay | Admitting: Family Medicine

## 2017-05-31 ENCOUNTER — Other Ambulatory Visit: Payer: Self-pay | Admitting: Cardiovascular Disease

## 2017-06-12 DIAGNOSIS — I509 Heart failure, unspecified: Secondary | ICD-10-CM | POA: Diagnosis not present

## 2017-06-14 ENCOUNTER — Telehealth: Payer: Self-pay | Admitting: *Deleted

## 2017-06-14 NOTE — Telephone Encounter (Signed)
I am not aware of the identity of the person that left the message. Patient will need to address at her next appt on 8/22

## 2017-06-14 NOTE — Telephone Encounter (Signed)
[name] called left message stating patient wants to switch cardiologist to [facility] Cardiologist, Dr Algernon Huxley. Please advise

## 2017-06-16 ENCOUNTER — Encounter: Payer: Self-pay | Admitting: Family Medicine

## 2017-06-16 ENCOUNTER — Ambulatory Visit: Payer: Self-pay | Admitting: Family Medicine

## 2017-06-21 ENCOUNTER — Emergency Department (HOSPITAL_COMMUNITY): Payer: Medicare Other

## 2017-06-21 ENCOUNTER — Encounter (HOSPITAL_COMMUNITY): Payer: Self-pay | Admitting: Emergency Medicine

## 2017-06-21 ENCOUNTER — Inpatient Hospital Stay (HOSPITAL_COMMUNITY)
Admission: EM | Admit: 2017-06-21 | Discharge: 2017-06-30 | DRG: 291 | Disposition: A | Discharge status: Skilled Nursing Facility | Payer: Medicare Other | Attending: Internal Medicine | Admitting: Internal Medicine

## 2017-06-21 DIAGNOSIS — I471 Supraventricular tachycardia: Secondary | ICD-10-CM | POA: Diagnosis not present

## 2017-06-21 DIAGNOSIS — E875 Hyperkalemia: Secondary | ICD-10-CM | POA: Diagnosis not present

## 2017-06-21 DIAGNOSIS — I639 Cerebral infarction, unspecified: Secondary | ICD-10-CM | POA: Diagnosis not present

## 2017-06-21 DIAGNOSIS — R4182 Altered mental status, unspecified: Secondary | ICD-10-CM | POA: Diagnosis not present

## 2017-06-21 DIAGNOSIS — Z9119 Patient's noncompliance with other medical treatment and regimen: Secondary | ICD-10-CM

## 2017-06-21 DIAGNOSIS — I130 Hypertensive heart and chronic kidney disease with heart failure and stage 1 through stage 4 chronic kidney disease, or unspecified chronic kidney disease: Principal | ICD-10-CM | POA: Diagnosis present

## 2017-06-21 DIAGNOSIS — F17210 Nicotine dependence, cigarettes, uncomplicated: Secondary | ICD-10-CM | POA: Diagnosis present

## 2017-06-21 DIAGNOSIS — E872 Acidosis: Secondary | ICD-10-CM | POA: Diagnosis not present

## 2017-06-21 DIAGNOSIS — E1122 Type 2 diabetes mellitus with diabetic chronic kidney disease: Secondary | ICD-10-CM | POA: Diagnosis present

## 2017-06-21 DIAGNOSIS — R739 Hyperglycemia, unspecified: Secondary | ICD-10-CM

## 2017-06-21 DIAGNOSIS — K219 Gastro-esophageal reflux disease without esophagitis: Secondary | ICD-10-CM | POA: Diagnosis present

## 2017-06-21 DIAGNOSIS — Z803 Family history of malignant neoplasm of breast: Secondary | ICD-10-CM

## 2017-06-21 DIAGNOSIS — F17219 Nicotine dependence, cigarettes, with unspecified nicotine-induced disorders: Secondary | ICD-10-CM | POA: Diagnosis present

## 2017-06-21 DIAGNOSIS — Z7902 Long term (current) use of antithrombotics/antiplatelets: Secondary | ICD-10-CM

## 2017-06-21 DIAGNOSIS — E785 Hyperlipidemia, unspecified: Secondary | ICD-10-CM | POA: Diagnosis not present

## 2017-06-21 DIAGNOSIS — M79609 Pain in unspecified limb: Secondary | ICD-10-CM | POA: Diagnosis not present

## 2017-06-21 DIAGNOSIS — N183 Chronic kidney disease, stage 3 unspecified: Secondary | ICD-10-CM

## 2017-06-21 DIAGNOSIS — E1169 Type 2 diabetes mellitus with other specified complication: Secondary | ICD-10-CM | POA: Diagnosis not present

## 2017-06-21 DIAGNOSIS — J9601 Acute respiratory failure with hypoxia: Secondary | ICD-10-CM | POA: Diagnosis not present

## 2017-06-21 DIAGNOSIS — I428 Other cardiomyopathies: Secondary | ICD-10-CM | POA: Diagnosis not present

## 2017-06-21 DIAGNOSIS — Z91199 Patient's noncompliance with other medical treatment and regimen due to unspecified reason: Secondary | ICD-10-CM

## 2017-06-21 DIAGNOSIS — N179 Acute kidney failure, unspecified: Secondary | ICD-10-CM | POA: Diagnosis not present

## 2017-06-21 DIAGNOSIS — R7989 Other specified abnormal findings of blood chemistry: Secondary | ICD-10-CM | POA: Diagnosis present

## 2017-06-21 DIAGNOSIS — Z7951 Long term (current) use of inhaled steroids: Secondary | ICD-10-CM

## 2017-06-21 DIAGNOSIS — F1410 Cocaine abuse, uncomplicated: Secondary | ICD-10-CM | POA: Diagnosis not present

## 2017-06-21 DIAGNOSIS — I110 Hypertensive heart disease with heart failure: Secondary | ICD-10-CM | POA: Diagnosis not present

## 2017-06-21 DIAGNOSIS — M24671 Ankylosis, right ankle: Secondary | ICD-10-CM | POA: Diagnosis not present

## 2017-06-21 DIAGNOSIS — F1910 Other psychoactive substance abuse, uncomplicated: Secondary | ICD-10-CM | POA: Diagnosis not present

## 2017-06-21 DIAGNOSIS — I255 Ischemic cardiomyopathy: Secondary | ICD-10-CM | POA: Diagnosis not present

## 2017-06-21 DIAGNOSIS — G9340 Encephalopathy, unspecified: Secondary | ICD-10-CM | POA: Diagnosis not present

## 2017-06-21 DIAGNOSIS — S99911A Unspecified injury of right ankle, initial encounter: Secondary | ICD-10-CM | POA: Diagnosis not present

## 2017-06-21 DIAGNOSIS — G9341 Metabolic encephalopathy: Secondary | ICD-10-CM | POA: Diagnosis not present

## 2017-06-21 DIAGNOSIS — Z01810 Encounter for preprocedural cardiovascular examination: Secondary | ICD-10-CM | POA: Diagnosis not present

## 2017-06-21 DIAGNOSIS — E669 Obesity, unspecified: Secondary | ICD-10-CM

## 2017-06-21 DIAGNOSIS — J9690 Respiratory failure, unspecified, unspecified whether with hypoxia or hypercapnia: Secondary | ICD-10-CM

## 2017-06-21 DIAGNOSIS — I5023 Acute on chronic systolic (congestive) heart failure: Secondary | ICD-10-CM | POA: Diagnosis not present

## 2017-06-21 DIAGNOSIS — F329 Major depressive disorder, single episode, unspecified: Secondary | ICD-10-CM | POA: Diagnosis present

## 2017-06-21 DIAGNOSIS — E876 Hypokalemia: Secondary | ICD-10-CM | POA: Diagnosis not present

## 2017-06-21 DIAGNOSIS — I509 Heart failure, unspecified: Secondary | ICD-10-CM

## 2017-06-21 DIAGNOSIS — Z8249 Family history of ischemic heart disease and other diseases of the circulatory system: Secondary | ICD-10-CM

## 2017-06-21 DIAGNOSIS — I6340 Cerebral infarction due to embolism of unspecified cerebral artery: Secondary | ICD-10-CM | POA: Diagnosis not present

## 2017-06-21 DIAGNOSIS — G834 Cauda equina syndrome: Secondary | ICD-10-CM | POA: Diagnosis not present

## 2017-06-21 DIAGNOSIS — R0602 Shortness of breath: Secondary | ICD-10-CM | POA: Diagnosis not present

## 2017-06-21 DIAGNOSIS — R748 Abnormal levels of other serum enzymes: Secondary | ICD-10-CM | POA: Diagnosis not present

## 2017-06-21 DIAGNOSIS — I6330 Cerebral infarction due to thrombosis of unspecified cerebral artery: Secondary | ICD-10-CM | POA: Insufficient documentation

## 2017-06-21 DIAGNOSIS — I700 Atherosclerosis of aorta: Secondary | ICD-10-CM | POA: Diagnosis present

## 2017-06-21 DIAGNOSIS — N1832 Chronic kidney disease, stage 3b: Secondary | ICD-10-CM | POA: Diagnosis present

## 2017-06-21 DIAGNOSIS — M48061 Spinal stenosis, lumbar region without neurogenic claudication: Secondary | ICD-10-CM | POA: Diagnosis present

## 2017-06-21 DIAGNOSIS — M7989 Other specified soft tissue disorders: Secondary | ICD-10-CM | POA: Diagnosis not present

## 2017-06-21 DIAGNOSIS — R2689 Other abnormalities of gait and mobility: Secondary | ICD-10-CM | POA: Diagnosis not present

## 2017-06-21 DIAGNOSIS — I420 Dilated cardiomyopathy: Secondary | ICD-10-CM | POA: Diagnosis not present

## 2017-06-21 DIAGNOSIS — E119 Type 2 diabetes mellitus without complications: Secondary | ICD-10-CM | POA: Diagnosis present

## 2017-06-21 DIAGNOSIS — J449 Chronic obstructive pulmonary disease, unspecified: Secondary | ICD-10-CM | POA: Diagnosis present

## 2017-06-21 DIAGNOSIS — I959 Hypotension, unspecified: Secondary | ICD-10-CM | POA: Diagnosis not present

## 2017-06-21 DIAGNOSIS — J9600 Acute respiratory failure, unspecified whether with hypoxia or hypercapnia: Secondary | ICD-10-CM | POA: Diagnosis present

## 2017-06-21 DIAGNOSIS — I248 Other forms of acute ischemic heart disease: Secondary | ICD-10-CM | POA: Diagnosis not present

## 2017-06-21 DIAGNOSIS — I5022 Chronic systolic (congestive) heart failure: Secondary | ICD-10-CM | POA: Diagnosis not present

## 2017-06-21 DIAGNOSIS — E1165 Type 2 diabetes mellitus with hyperglycemia: Secondary | ICD-10-CM | POA: Diagnosis not present

## 2017-06-21 DIAGNOSIS — I214 Non-ST elevation (NSTEMI) myocardial infarction: Secondary | ICD-10-CM

## 2017-06-21 DIAGNOSIS — Z6834 Body mass index (BMI) 34.0-34.9, adult: Secondary | ICD-10-CM

## 2017-06-21 DIAGNOSIS — R29898 Other symptoms and signs involving the musculoskeletal system: Secondary | ICD-10-CM | POA: Diagnosis not present

## 2017-06-21 DIAGNOSIS — I5040 Unspecified combined systolic (congestive) and diastolic (congestive) heart failure: Secondary | ICD-10-CM | POA: Diagnosis not present

## 2017-06-21 DIAGNOSIS — M545 Low back pain: Secondary | ICD-10-CM | POA: Diagnosis not present

## 2017-06-21 DIAGNOSIS — E874 Mixed disorder of acid-base balance: Secondary | ICD-10-CM | POA: Diagnosis not present

## 2017-06-21 DIAGNOSIS — R52 Pain, unspecified: Secondary | ICD-10-CM

## 2017-06-21 DIAGNOSIS — E1159 Type 2 diabetes mellitus with other circulatory complications: Secondary | ICD-10-CM | POA: Diagnosis present

## 2017-06-21 DIAGNOSIS — I5021 Acute systolic (congestive) heart failure: Secondary | ICD-10-CM | POA: Diagnosis not present

## 2017-06-21 DIAGNOSIS — G894 Chronic pain syndrome: Secondary | ICD-10-CM

## 2017-06-21 DIAGNOSIS — R531 Weakness: Secondary | ICD-10-CM | POA: Diagnosis not present

## 2017-06-21 DIAGNOSIS — R0600 Dyspnea, unspecified: Secondary | ICD-10-CM | POA: Diagnosis not present

## 2017-06-21 DIAGNOSIS — I10 Essential (primary) hypertension: Secondary | ICD-10-CM | POA: Diagnosis not present

## 2017-06-21 DIAGNOSIS — R778 Other specified abnormalities of plasma proteins: Secondary | ICD-10-CM | POA: Diagnosis present

## 2017-06-21 DIAGNOSIS — F410 Panic disorder [episodic paroxysmal anxiety] without agoraphobia: Secondary | ICD-10-CM | POA: Diagnosis present

## 2017-06-21 DIAGNOSIS — T1490XA Injury, unspecified, initial encounter: Secondary | ICD-10-CM | POA: Diagnosis not present

## 2017-06-21 DIAGNOSIS — Z9114 Patient's other noncompliance with medication regimen: Secondary | ICD-10-CM

## 2017-06-21 DIAGNOSIS — E782 Mixed hyperlipidemia: Secondary | ICD-10-CM | POA: Diagnosis present

## 2017-06-21 DIAGNOSIS — M5126 Other intervertebral disc displacement, lumbar region: Secondary | ICD-10-CM | POA: Diagnosis not present

## 2017-06-21 DIAGNOSIS — R0989 Other specified symptoms and signs involving the circulatory and respiratory systems: Secondary | ICD-10-CM | POA: Diagnosis not present

## 2017-06-21 DIAGNOSIS — I341 Nonrheumatic mitral (valve) prolapse: Secondary | ICD-10-CM | POA: Diagnosis not present

## 2017-06-21 DIAGNOSIS — E11610 Type 2 diabetes mellitus with diabetic neuropathic arthropathy: Secondary | ICD-10-CM | POA: Diagnosis not present

## 2017-06-21 DIAGNOSIS — G464 Cerebellar stroke syndrome: Secondary | ICD-10-CM | POA: Diagnosis not present

## 2017-06-21 DIAGNOSIS — R05 Cough: Secondary | ICD-10-CM | POA: Diagnosis not present

## 2017-06-21 DIAGNOSIS — I749 Embolism and thrombosis of unspecified artery: Secondary | ICD-10-CM | POA: Diagnosis not present

## 2017-06-21 DIAGNOSIS — F1010 Alcohol abuse, uncomplicated: Secondary | ICD-10-CM | POA: Diagnosis present

## 2017-06-21 DIAGNOSIS — I519 Heart disease, unspecified: Secondary | ICD-10-CM | POA: Diagnosis present

## 2017-06-21 DIAGNOSIS — J441 Chronic obstructive pulmonary disease with (acute) exacerbation: Secondary | ICD-10-CM | POA: Diagnosis not present

## 2017-06-21 LAB — CBC
HCT: 41.4 % (ref 36.0–46.0)
Hemoglobin: 14.1 g/dL (ref 12.0–15.0)
MCH: 32.3 pg (ref 26.0–34.0)
MCHC: 34.1 g/dL (ref 30.0–36.0)
MCV: 95 fL (ref 78.0–100.0)
Platelets: 194 10*3/uL (ref 150–400)
RBC: 4.36 MIL/uL (ref 3.87–5.11)
RDW: 13.8 % (ref 11.5–15.5)
WBC: 7.4 10*3/uL (ref 4.0–10.5)

## 2017-06-21 LAB — COMPREHENSIVE METABOLIC PANEL
ALT: 31 U/L (ref 14–54)
AST: 13 U/L — ABNORMAL LOW (ref 15–41)
Albumin: 3.1 g/dL — ABNORMAL LOW (ref 3.5–5.0)
Alkaline Phosphatase: 178 U/L — ABNORMAL HIGH (ref 38–126)
Anion gap: 11 (ref 5–15)
BUN: 23 mg/dL — ABNORMAL HIGH (ref 6–20)
CO2: 27 mmol/L (ref 22–32)
Calcium: 9.1 mg/dL (ref 8.9–10.3)
Chloride: 96 mmol/L — ABNORMAL LOW (ref 101–111)
Creatinine, Ser: 1.47 mg/dL — ABNORMAL HIGH (ref 0.44–1.00)
GFR calc Af Amer: 46 mL/min — ABNORMAL LOW (ref >60)
GFR calc non Af Amer: 40 mL/min — ABNORMAL LOW (ref >60)
Glucose, Bld: 629 mg/dL (ref 65–99)
Potassium: 2.9 mmol/L — ABNORMAL LOW (ref 3.5–5.1)
Sodium: 134 mmol/L — ABNORMAL LOW (ref 135–145)
Total Bilirubin: 1.2 mg/dL (ref 0.3–1.2)
Total Protein: 5.7 g/dL — ABNORMAL LOW (ref 6.5–8.1)

## 2017-06-21 LAB — TROPONIN I
Troponin I: 0.32 ng/mL (ref <0.03)
Troponin I: 0.46 ng/mL (ref <0.03)

## 2017-06-21 LAB — URINALYSIS, ROUTINE W REFLEX MICROSCOPIC
Bilirubin Urine: NEGATIVE
Glucose, UA: >=500 mg/dL — AB
Ketones, ur: NEGATIVE mg/dL
Nitrite: NEGATIVE
Protein, ur: >=300 mg/dL — AB
Specific Gravity, Urine: 1.022 (ref 1.005–1.030)
pH: 6 (ref 5.0–8.0)

## 2017-06-21 LAB — CK: Total CK: 63 U/L (ref 38–234)

## 2017-06-21 LAB — RAPID URINE DRUG SCREEN, HOSP PERFORMED
Amphetamines: NOT DETECTED
Barbiturates: NOT DETECTED
Benzodiazepines: NOT DETECTED
Cocaine: POSITIVE — AB
Opiates: NOT DETECTED
Tetrahydrocannabinol: NOT DETECTED

## 2017-06-21 LAB — CBG MONITORING, ED
Glucose-Capillary: 588 mg/dL (ref 65–99)
Glucose-Capillary: >600 mg/dL (ref 65–99)
Glucose-Capillary: 583 mg/dL (ref 65–99)
Glucose-Capillary: >600 mg/dL (ref 65–99)
Glucose-Capillary: 427 mg/dL — ABNORMAL HIGH (ref 65–99)
Glucose-Capillary: 294 mg/dL — ABNORMAL HIGH (ref 65–99)

## 2017-06-21 LAB — ETHANOL: Alcohol, Ethyl (B): <5 mg/dL (ref <5)

## 2017-06-21 LAB — BRAIN NATRIURETIC PEPTIDE: B Natriuretic Peptide: 1152 pg/mL — ABNORMAL HIGH (ref 0.0–100.0)

## 2017-06-21 MED ORDER — POLYETHYLENE GLYCOL 3350 17 G PO PACK: 17.0000 g | PACK | Freq: Every day | ORAL | Status: DC | PRN

## 2017-06-21 MED ORDER — INSULIN ASPART 100 UNIT/ML ~~LOC~~ SOLN: 0.0000 [IU] | Freq: Every day | SUBCUTANEOUS | Status: DC

## 2017-06-21 MED ORDER — MONTELUKAST SODIUM 10 MG PO TABS
10.0000 mg | ORAL_TABLET | Freq: Every day | ORAL | Status: DC
  Administered 2017-06-21 – 2017-06-23 (×3): 10 mg via ORAL
  Filled 2017-06-21 (×4): qty 1

## 2017-06-21 MED ORDER — OXYCODONE HCL 5 MG PO TABS
5.0000 mg | ORAL_TABLET | ORAL | Status: DC | PRN
  Administered 2017-06-21 – 2017-06-22 (×2): 5 mg via ORAL
  Filled 2017-06-21 (×2): qty 1

## 2017-06-21 MED ORDER — POTASSIUM CHLORIDE 10 MEQ/100ML IV SOLN
INTRAVENOUS | Status: AC
  Filled 2017-06-21: qty 300

## 2017-06-21 MED ORDER — MAGNESIUM SULFATE 2 GM/50ML IV SOLN
2.0000 g | Freq: Once | INTRAVENOUS | Status: AC
  Administered 2017-06-21: 2 g via INTRAVENOUS
  Filled 2017-06-21: qty 50

## 2017-06-21 MED ORDER — ADULT MULTIVITAMIN W/MINERALS CH
1.0000 | ORAL_TABLET | Freq: Every day | ORAL | Status: DC
  Administered 2017-06-23 – 2017-06-26 (×4): 1 via ORAL
  Filled 2017-06-21 (×4): qty 1

## 2017-06-21 MED ORDER — ASPIRIN 81 MG PO CHEW
324.0000 mg | CHEWABLE_TABLET | Freq: Once | ORAL | Status: AC
  Administered 2017-06-21: 324 mg via ORAL
  Filled 2017-06-21: qty 4

## 2017-06-21 MED ORDER — FUROSEMIDE 10 MG/ML IJ SOLN
40.0000 mg | INTRAMUSCULAR | Status: AC
  Administered 2017-06-21: 40 mg via INTRAVENOUS
  Filled 2017-06-21: qty 4

## 2017-06-21 MED ORDER — TIOTROPIUM BROMIDE MONOHYDRATE 18 MCG IN CAPS
18.0000 ug | ORAL_CAPSULE | Freq: Every day | RESPIRATORY_TRACT | Status: DC
  Administered 2017-06-22: 18 ug via RESPIRATORY_TRACT
  Filled 2017-06-21 (×2): qty 5

## 2017-06-21 MED ORDER — POTASSIUM CHLORIDE 10 MEQ/100ML IV SOLN
10.0000 meq | INTRAVENOUS | Status: AC
  Administered 2017-06-21 – 2017-06-22 (×4): 10 meq via INTRAVENOUS
  Filled 2017-06-21: qty 100

## 2017-06-21 MED ORDER — FUROSEMIDE 10 MG/ML IJ SOLN: 40.0000 mg | Freq: Two times a day (BID) | INTRAMUSCULAR | Status: DC

## 2017-06-21 MED ORDER — FENOFIBRATE 160 MG PO TABS
160.0000 mg | ORAL_TABLET | Freq: Every day | ORAL | Status: DC
  Administered 2017-06-21 – 2017-06-30 (×9): 160 mg via ORAL
  Filled 2017-06-21 (×11): qty 1

## 2017-06-21 MED ORDER — POTASSIUM CHLORIDE 10 MEQ/100ML IV SOLN
10.0000 meq | Freq: Once | INTRAVENOUS | Status: AC
  Administered 2017-06-21: 10 meq via INTRAVENOUS
  Filled 2017-06-21: qty 100

## 2017-06-21 MED ORDER — FOLIC ACID 1 MG PO TABS
1.0000 mg | ORAL_TABLET | Freq: Every day | ORAL | Status: DC
  Administered 2017-06-23 – 2017-06-26 (×4): 1 mg via ORAL
  Filled 2017-06-21 (×4): qty 1

## 2017-06-21 MED ORDER — LINAGLIPTIN 5 MG PO TABS
5.0000 mg | ORAL_TABLET | Freq: Every day | ORAL | Status: DC
  Administered 2017-06-21: 5 mg via ORAL
  Filled 2017-06-21 (×4): qty 1

## 2017-06-21 MED ORDER — DEXTROSE-NACL 5-0.45 % IV SOLN
INTRAVENOUS | Status: DC
  Administered 2017-06-22: 10 mL/h via INTRAVENOUS

## 2017-06-21 MED ORDER — TIOTROPIUM BROMIDE MONOHYDRATE 18 MCG IN CAPS
ORAL_CAPSULE | RESPIRATORY_TRACT | Status: AC
Start: 2017-06-21 — End: 2017-06-21
  Filled 2017-06-21: qty 5

## 2017-06-21 MED ORDER — BENAZEPRIL HCL 40 MG PO TABS
40.0000 mg | ORAL_TABLET | Freq: Every day | ORAL | Status: DC
  Administered 2017-06-21: 40 mg via ORAL
  Filled 2017-06-21: qty 1
  Filled 2017-06-21: qty 4
  Filled 2017-06-21 (×2): qty 1

## 2017-06-21 MED ORDER — ACETAMINOPHEN 325 MG PO TABS
650.0000 mg | ORAL_TABLET | Freq: Four times a day (QID) | ORAL | Status: DC | PRN
  Administered 2017-06-25 – 2017-06-28 (×4): 650 mg via ORAL
  Filled 2017-06-21 (×5): qty 2

## 2017-06-21 MED ORDER — FLUOXETINE HCL 20 MG PO CAPS
20.0000 mg | ORAL_CAPSULE | Freq: Every day | ORAL | Status: DC
  Administered 2017-06-23 – 2017-06-24 (×2): 20 mg via ORAL
  Filled 2017-06-21 (×2): qty 1

## 2017-06-21 MED ORDER — EZETIMIBE 10 MG PO TABS
10.0000 mg | ORAL_TABLET | Freq: Every day | ORAL | Status: DC
  Administered 2017-06-21 – 2017-06-30 (×9): 10 mg via ORAL
  Filled 2017-06-21 (×12): qty 1

## 2017-06-21 MED ORDER — ONDANSETRON HCL 4 MG PO TABS: 4.0000 mg | ORAL_TABLET | Freq: Four times a day (QID) | ORAL | Status: DC | PRN

## 2017-06-21 MED ORDER — INSULIN GLARGINE 100 UNIT/ML ~~LOC~~ SOLN: 20.0000 [IU] | Freq: Every day | SUBCUTANEOUS | Status: DC

## 2017-06-21 MED ORDER — SODIUM CHLORIDE 0.9 % IV SOLN
INTRAVENOUS | Status: DC
  Administered 2017-06-21: 17:00:00 via INTRAVENOUS

## 2017-06-21 MED ORDER — BISACODYL 5 MG PO TBEC: 5.0000 mg | DELAYED_RELEASE_TABLET | Freq: Every day | ORAL | Status: DC | PRN

## 2017-06-21 MED ORDER — HYDROXYZINE HCL 25 MG PO TABS
50.0000 mg | ORAL_TABLET | Freq: Every day | ORAL | Status: DC
  Administered 2017-06-21 – 2017-06-23 (×3): 50 mg via ORAL
  Filled 2017-06-21 (×3): qty 2

## 2017-06-21 MED ORDER — FENOFIBRATE 160 MG PO TABS
ORAL_TABLET | ORAL | Status: AC
  Filled 2017-06-21: qty 1

## 2017-06-21 MED ORDER — ACETAMINOPHEN 650 MG RE SUPP: 650.0000 mg | Freq: Four times a day (QID) | RECTAL | Status: DC | PRN

## 2017-06-21 MED ORDER — VITAMIN D (ERGOCALCIFEROL) 1.25 MG (50000 UNIT) PO CAPS
50000.0000 [IU] | ORAL_CAPSULE | ORAL | Status: DC
  Filled 2017-06-21: qty 1

## 2017-06-21 MED ORDER — SPIRONOLACTONE 25 MG PO TABS
25.0000 mg | ORAL_TABLET | Freq: Two times a day (BID) | ORAL | Status: DC
  Administered 2017-06-21: 25 mg via ORAL
  Filled 2017-06-21 (×4): qty 1

## 2017-06-21 MED ORDER — TRAZODONE HCL 50 MG PO TABS
50.0000 mg | ORAL_TABLET | Freq: Every day | ORAL | Status: DC
  Administered 2017-06-21 – 2017-06-23 (×3): 50 mg via ORAL
  Filled 2017-06-21 (×4): qty 1

## 2017-06-21 MED ORDER — SODIUM CHLORIDE 0.9% FLUSH
3.0000 mL | Freq: Two times a day (BID) | INTRAVENOUS | Status: DC
  Administered 2017-06-22 – 2017-06-30 (×12): 3 mL via INTRAVENOUS

## 2017-06-21 MED ORDER — CLOPIDOGREL BISULFATE 75 MG PO TABS
75.0000 mg | ORAL_TABLET | Freq: Every day | ORAL | Status: DC
  Administered 2017-06-22 – 2017-06-30 (×9): 75 mg via ORAL
  Filled 2017-06-21 (×9): qty 1

## 2017-06-21 MED ORDER — POTASSIUM CHLORIDE CRYS ER 20 MEQ PO TBCR
40.0000 meq | EXTENDED_RELEASE_TABLET | Freq: Once | ORAL | Status: AC
  Administered 2017-06-21: 40 meq via ORAL
  Filled 2017-06-21: qty 2

## 2017-06-21 MED ORDER — FUROSEMIDE 10 MG/ML IJ SOLN
40.0000 mg | Freq: Two times a day (BID) | INTRAMUSCULAR | Status: DC
  Administered 2017-06-22: 40 mg via INTRAVENOUS
  Filled 2017-06-21: qty 4

## 2017-06-21 MED ORDER — SODIUM CHLORIDE 0.9 % IV BOLUS (SEPSIS)
1000.0000 mL | Freq: Once | INTRAVENOUS | Status: AC
  Administered 2017-06-21: 1000 mL via INTRAVENOUS

## 2017-06-21 MED ORDER — IPRATROPIUM-ALBUTEROL 0.5-2.5 (3) MG/3ML IN SOLN
3.0000 mL | Freq: Four times a day (QID) | RESPIRATORY_TRACT | Status: DC | PRN
  Filled 2017-06-21: qty 3

## 2017-06-21 MED ORDER — ASPIRIN EC 81 MG PO TBEC: 81.0000 mg | DELAYED_RELEASE_TABLET | Freq: Every day | ORAL | Status: DC

## 2017-06-21 MED ORDER — ROSUVASTATIN CALCIUM 20 MG PO TABS
40.0000 mg | ORAL_TABLET | Freq: Every day | ORAL | Status: DC
  Administered 2017-06-21 – 2017-06-30 (×9): 40 mg via ORAL
  Filled 2017-06-21 (×2): qty 2
  Filled 2017-06-21: qty 1
  Filled 2017-06-21 (×2): qty 2
  Filled 2017-06-21: qty 8
  Filled 2017-06-21 (×3): qty 2
  Filled 2017-06-21 (×2): qty 1

## 2017-06-21 MED ORDER — POTASSIUM CHLORIDE CRYS ER 20 MEQ PO TBCR
30.0000 meq | EXTENDED_RELEASE_TABLET | Freq: Two times a day (BID) | ORAL | Status: DC
  Administered 2017-06-22 – 2017-06-24 (×4): 30 meq via ORAL
  Filled 2017-06-21 (×4): qty 1

## 2017-06-21 MED ORDER — MOMETASONE FURO-FORMOTEROL FUM 200-5 MCG/ACT IN AERO
2.0000 | INHALATION_SPRAY | Freq: Two times a day (BID) | RESPIRATORY_TRACT | Status: DC
  Administered 2017-06-21 – 2017-06-30 (×11): 2 via RESPIRATORY_TRACT
  Filled 2017-06-21 (×3): qty 8.8

## 2017-06-21 MED ORDER — GABAPENTIN 100 MG PO CAPS
100.0000 mg | ORAL_CAPSULE | Freq: Three times a day (TID) | ORAL | Status: DC
  Administered 2017-06-21 – 2017-06-24 (×6): 100 mg via ORAL
  Filled 2017-06-21 (×7): qty 1

## 2017-06-21 MED ORDER — ONDANSETRON HCL 4 MG/2ML IJ SOLN: 4.0000 mg | Freq: Four times a day (QID) | INTRAMUSCULAR | Status: DC | PRN

## 2017-06-21 MED ORDER — OXYCODONE HCL 5 MG PO TABS
10.0000 mg | ORAL_TABLET | Freq: Once | ORAL | Status: AC
  Administered 2017-06-21: 10 mg via ORAL
  Filled 2017-06-21: qty 2

## 2017-06-21 MED ORDER — ENOXAPARIN SODIUM 40 MG/0.4ML ~~LOC~~ SOLN
40.0000 mg | SUBCUTANEOUS | Status: DC
  Administered 2017-06-21 – 2017-06-24 (×4): 40 mg via SUBCUTANEOUS
  Filled 2017-06-21 (×4): qty 0.4

## 2017-06-21 MED ORDER — DEXTROSE-NACL 5-0.45 % IV SOLN: INTRAVENOUS | Status: DC

## 2017-06-21 MED ORDER — MAGNESIUM CITRATE PO SOLN: 1.0000 | Freq: Once | ORAL | Status: DC | PRN

## 2017-06-21 MED ORDER — PANTOPRAZOLE SODIUM 40 MG PO TBEC
40.0000 mg | DELAYED_RELEASE_TABLET | Freq: Every day | ORAL | Status: DC
  Administered 2017-06-23 – 2017-06-30 (×8): 40 mg via ORAL
  Filled 2017-06-21 (×8): qty 1

## 2017-06-21 MED ORDER — REPAGLINIDE 0.5 MG PO TABS
1.0000 mg | ORAL_TABLET | Freq: Three times a day (TID) | ORAL | Status: DC
  Filled 2017-06-21 (×5): qty 1

## 2017-06-21 MED ORDER — TRAMADOL HCL 50 MG PO TABS
50.0000 mg | ORAL_TABLET | Freq: Two times a day (BID) | ORAL | Status: DC
  Administered 2017-06-21 – 2017-06-24 (×5): 50 mg via ORAL
  Filled 2017-06-21 (×5): qty 1

## 2017-06-21 MED ORDER — SODIUM CHLORIDE 0.9 % IV SOLN
INTRAVENOUS | Status: DC
  Filled 2017-06-21: qty 1

## 2017-06-21 MED ORDER — VITAMIN B-1 100 MG PO TABS
100.0000 mg | ORAL_TABLET | Freq: Every day | ORAL | Status: DC
  Administered 2017-06-23: 100 mg via ORAL
  Filled 2017-06-21: qty 1

## 2017-06-21 MED ORDER — SODIUM CHLORIDE 0.9 % IV SOLN
INTRAVENOUS | Status: DC
  Administered 2017-06-21: 5.4 [IU]/h via INTRAVENOUS
  Filled 2017-06-21 (×2): qty 1

## 2017-06-21 MED ORDER — SODIUM CHLORIDE 0.9 % IV SOLN: INTRAVENOUS | Status: DC

## 2017-06-21 MED ORDER — LISINOPRIL 5 MG PO TABS: 2.5000 mg | ORAL_TABLET | Freq: Every day | ORAL | Status: DC

## 2017-06-21 MED ORDER — CYCLOSPORINE 0.05 % OP EMUL
OPHTHALMIC | Status: AC
  Filled 2017-06-21: qty 1

## 2017-06-21 MED ORDER — CYCLOSPORINE 0.05 % OP EMUL
1.0000 [drp] | Freq: Two times a day (BID) | OPHTHALMIC | Status: DC
  Administered 2017-06-21 – 2017-06-30 (×15): 1 [drp] via OPHTHALMIC
  Filled 2017-06-21 (×21): qty 1

## 2017-06-21 MED ORDER — SODIUM CHLORIDE 0.9 % IV SOLN
INTRAVENOUS | Status: DC
  Administered 2017-06-21: 21:00:00 via INTRAVENOUS

## 2017-06-21 MED ORDER — TIZANIDINE HCL 2 MG PO CAPS
2.0000 mg | ORAL_CAPSULE | Freq: Two times a day (BID) | ORAL | Status: DC | PRN
  Filled 2017-06-21: qty 1

## 2017-06-21 MED ORDER — ASPIRIN EC 81 MG PO TBEC
81.0000 mg | DELAYED_RELEASE_TABLET | Freq: Every day | ORAL | Status: DC
  Administered 2017-06-23 – 2017-06-30 (×8): 81 mg via ORAL
  Filled 2017-06-21 (×8): qty 1

## 2017-06-21 MED ORDER — INSULIN ASPART 100 UNIT/ML ~~LOC~~ SOLN: 3.0000 [IU] | Freq: Three times a day (TID) | SUBCUTANEOUS | Status: DC

## 2017-06-21 MED ORDER — SPIRONOLACTONE 25 MG PO TABS
ORAL_TABLET | ORAL | Status: AC
  Filled 2017-06-21: qty 1

## 2017-06-21 NOTE — H&P (Addendum)
History and Physical    [name] WPY:[id number] DOB: [birth year] DOA: 06/21/2017  PCP: [name], MD   Patient coming from: Motel where she lives  I have personally briefly reviewed patient's old medical records in [place]  Chief Complaint: New inability to walk with weakness in the bilateral lower extremities since this morning  HPI: [name] is a 51 y.o. female with medical history significant of dilated cardiomyopathy with ejection fraction of 20-25%, diabetes mellitus type 2 uncontrolled with chronic kidney disease stage III, uncontrolled hypertension, cocaine abuse, tobacco abuse, chronic obstructive pulmonary disease, hyperlipidemia, and medical noncompliance who presents the emergency department stating that she can no longer walk her legs won't hold her up.  Patient states that she has not been taking any of her medications for her heart in 2 months. She stopped her diabetes medications 2 weeks ago. She lives by herself in a motel. Home health was coming to help her until the home health company went out of business. Since that time her sinus bedtime to help take care of her. She is usually able to walk over the last couple of days she's become increasingly weak late last night or very early this morning she was unable to get up off the commode because of bilateral leg weakness. She slid to the ground and crawled out to the main area of the motel room where she remained in a crosslegged sitting pattern until family came approximately 12 hours later. She didn't call 911 because she did not want to come to the hospital.  Ultimately when family found her and they convinced her come into the emergency department. She has complaints of increased swelling in her legs (not taking her Lasix) and shortness of breath. She feels generally ill which has been progressive and now become severe. She denies any fevers. She has had poor urine output recently not making much  urine at all. She has increased thirst. She has no chest pain she denies palpitations. She says she has serious chronic low back pain and this has not worsened but the weakness is severe. Also for past few days she has been having intermittent myoclonic jerking which lasted about 30 seconds and resolves. She has no other shaking. He has made her symptoms better nothing has made them worse. Symptoms have been chronic and worsening since they developed.   ED Course: It was rapidly evaluated in the emergency department appropriate blood work was obtained and she was found to have potassium of 2.9, creatinine 1.47, troponin 0.32, glucose 629, BNP 1152, urine drug screen positive for cocaine, chest x-ray shows pulmonary vascular congestion, lumbosacral spine films show severe degenerative disc disease at L3-4 with grade 1 anterior listhesis and moderately severe degenerative changes at L4-5, and L5-S1. MRI shows multiple areas of progression in her lumbar spine both degenerative disc disease and arthritis but most significantly she has severe canal stenosis at L3-4 with disorganized cauda equina.  Given the severity of her symptoms and findings on radiological evaluation as well as laboratory evaluation the patient will be admitted to [id number] for evaluation by cardiology for acute coronary syndrome and evaluation by neurosurgery for severe canal stenosis at L3-4.   Review of Systems: Review of Systems  Constitutional: Negative for chills, fever and malaise/fatigue.  HENT: Negative for ear pain, hearing loss and tinnitus.   Eyes: Negative for blurred vision and double vision.  Respiratory: Negative for cough and hemoptysis.   Cardiovascular: Negative for chest  pain, palpitations and orthopnea.  Gastrointestinal: Negative for heartburn, nausea and vomiting.  Genitourinary: Negative for dysuria, frequency and urgency.  Musculoskeletal: Positive for back pain. Negative for joint pain and myalgias.   Skin: Negative for itching and rash.  Neurological: Positive for weakness. Negative for dizziness and headaches.  Endo/Heme/Allergies: Negative for environmental allergies. Does not bruise/bleed easily.  Psychiatric/Behavioral: Negative for depression and suicidal ideas.    Past Medical History:  Diagnosis Date  . Alcohol use    Now abstinent.  . Ankle fracture, lateral malleolus, closed 12/30/2011  . Anxiety   . Breast mass, left 12/15/2011  . CKD (chronic kidney disease)   . COPD (chronic obstructive pulmonary disease) ([place])   . Diabetes mellitus, type 2 ([place])   . Diabetic Charcot foot ([place]) 12/30/2011  . Essential hypertension   . History of GI bleed 12/05/2009   Qualifier: Diagnosis of  By: Zeb Comfort    . Hyperlipidemia   . Hypertensive cardiomyopathy ([place]) 11/08/2012   Ejection fraction 40-45%.  . Noncompliance with medication regimen   . Obesity   . Panic attacks   . Tobacco abuse     Past Surgical History:  Procedure Laterality Date  . BREAST BIOPSY    . CARDIAC CATHETERIZATION N/A 07/28/2016   Procedure: Left Heart Cath and Coronary Angiography;  Surgeon: Jettie Booze, MD;  Location: [facility];  Service: Cardiovascular;  Laterality: N/A;  . DILATION AND CURETTAGE OF UTERUS       reports that she has been smoking Cigarettes.  She has a 20.00 pack-year smoking history. She has never used smokeless tobacco. She reports that she uses drugs, including Marijuana and Cocaine, about 3 times per week. She reports that she does not drink alcohol.  No Known Allergies  Family History  Problem Relation Age of Onset  . Hypertension Mother   . Heart attack Mother   . Hypertension Father        CABG   . Hypertension Sister   . Hypertension Brother   . Hypertension Sister   . Cancer Sister        breast   . Arthritis Unknown   . Cancer Unknown   . Diabetes Unknown   . Asthma Unknown   . Hypertension Daughter   . Hypertension Son     Prior to  Admission medications   Medication Sig Start Date End Date Taking? Authorizing Provider  amLODipine (NORVASC) 10 MG tablet TAKE 1 TABLET BY MOUTH ONCE A DAY. 02/01/17   [name], MD  azelastine (ASTELIN) 0.1 % nasal spray Place 2 sprays into both nostrils 2 (two) times daily. Use in each nostril as directed 04/02/17   [name], MD  benazepril (LOTENSIN) 40 MG tablet TAKE 1 TABLET BY MOUTH ONCE A DAY. 02/01/17   [name], MD  bisoprolol (ZEBETA) 5 MG tablet Take 1 tablet (5 mg total) by mouth daily. 04/26/17   [name], MD  budesonide-formoterol [facility]) 160-4.5 MCG/ACT inhaler Inhale 2 puffs into the lungs 2 (two) times daily. 01/18/17   [name], MD  Choline Fenofibrate 135 MG capsule TAKE 1 CAPSULE BY MOUTH EVERY DAY. 06/01/17   [name], MD  cloNIDine (CATAPRES) 0.2 MG tablet TAKE (1) TABLET BY MOUTH AT BEDTIME FOR BLOOD PRESSURE. 11/30/16   [name], MD  clopidogrel (PLAVIX) 75 MG tablet TAKE 1 TABLET BY MOUTH DAILY WITH BREAKFAST. 06/01/17   [name], MD  cycloSPORINE (RESTASIS) 0.05 %  ophthalmic emulsion Place 1 drop into both eyes 2 (two) times daily.    [provider]  ezetimibe (ZETIA) 10 MG tablet Take 1 tablet (10 mg total) by mouth daily. 04/26/17   [name], MD  FLUoxetine (PROZAC) 20 MG capsule TAKE 1 CAPSULE BY MOUTH EVERY DAY. 06/01/17   [name], MD  fluticasone [facility]) 50 MCG/ACT nasal spray Place 2 sprays into both nostrils daily. 02/08/17   [name], MD  furosemide (LASIX) 20 MG tablet TAKE 1 TABLET BY MOUTH DAILY. 05/31/17   Herminio Commons, MD  gabapentin (NEURONTIN) 100 MG capsule TAKE 1 CAPSULE BY MOUTH THREE TIMES A DAY. 06/01/17   [name], MD  hydrOXYzine (ATARAX/VISTARIL) 50 MG tablet TAKE (1) TABLET BY MOUTH AT BEDTIME. 06/01/17   [name], MD  ipratropium-albuterol (DUONEB) 0.5-2.5 (3) MG/3ML SOLN Take 3 mLs by nebulization every 6  (six) hours as needed. Patient taking differently: Take 3 mLs by nebulization every 6 (six) hours as needed (for wheezing/shortness of breath).  03/10/16   [name], MD  montelukast (SINGULAIR) 10 MG tablet TAKE (1) TABLET BY MOUTH AT BEDTIME. 06/01/17   [name], MD  pantoprazole (PROTONIX) 40 MG tablet Take 1 tablet (40 mg total) by mouth daily. 04/26/17   [name], MD  potassium chloride SA (K-DUR,KLOR-CON) 20 MEQ tablet TAKE 1&1/2 TABLET BY MOUTH DAILY. 06/01/17   [name], MD  PROAIR HFA 108 [phone] Base) MCG/ACT inhaler INHALE 2 PUFFS EVERY 6 HOURS AS NEEDED FOR SHORTNESS OF BREATH/WHEEZING. 06/01/17   [name], MD  repaglinide (PRANDIN) 1 MG tablet TAKE 1 TABLET BY MOUTH 3 TIMES DAILY BEFORE MEALS. 11/30/16   [name], MD  rosuvastatin (CRESTOR) 40 MG tablet TAKE 1 TABLET BY MOUTH ONCE A DAY. 06/01/17   [name], MD  SPIRIVA HANDIHALER 18 MCG inhalation capsule Place 18 mcg into inhaler and inhale daily.  05/13/16   [provider]  spironolactone (ALDACTONE) 25 MG tablet TAKE 1 TABLET BY MOUTH TWICE A DAY. 05/31/17   Herminio Commons, MD  tizanidine (ZANAFLEX) 2 MG capsule Take 1 capsule (2 mg total) by mouth 2 (two) times daily. Patient taking differently: Take 2 mg by mouth 2 (two) times daily as needed for muscle spasms.  01/30/15   [name], MD  TRADJENTA 5 MG TABS tablet TAKE 1 TABLET BY MOUTH ONCE A DAY. 06/01/17   [name], MD  traMADol (ULTRAM) 50 MG tablet TAKE 1 TABLET BY MOUTH TWICE A DAY. 12/08/16   [name], MD  traZODone (DESYREL) 50 MG tablet TAKE (1) TABLET BY MOUTH AT BEDTIME. 06/01/17   [name], MD  Vitamin D, Ergocalciferol, (DRISDOL) 50000 units CAPS capsule Take 1 capsule (50,000 Units total) by mouth once a week. 03/02/17   [name], MD  Patient has not taken any medications in 2 weeks  Physical Exam: Vitals:   06/21/17 1554 06/21/17 1900 06/21/17 1925   BP: (!) 141/88 (!) 152/86 (!) 152/86  Pulse: 84 93 96  Resp: 16 14 (!) 26  Temp: 98.4 F (36.9 C)    TempSrc: Oral    SpO2: 98% (!) 89% 95%  Weight: 83 kg (183 lb)    Height: '5\' 1"'  (1.549 m)     .TCS Constitutional: NAD, calm, comfortable Vitals:   06/21/17 1554 06/21/17 1900 06/21/17 1925  BP: (!) 141/88 (!) 152/86 (!) 152/86  Pulse: 84  93 96  Resp: 16 14 (!) 26  Temp: 98.4 F (36.9 C)    TempSrc: Oral    SpO2: 98% (!) 89% 95%  Weight: 83 kg (183 lb)    Height: '5\' 1"'  (1.549 m)     Eyes: PERRL, lids and conjunctivae normal ENMT: Mucous membranes are moist. Posterior pharynx clear of any exudate or lesions.Normal dentition.  Neck: normal, supple, no masses, no thyromegaly Respiratory: clear to auscultation bilaterally, no wheezing, Bilateral crackles halfway up the lung fields. Normal respiratory effort. No accessory muscle use.  Cardiovascular: Regular rate and rhythm, no murmurs / rubs / gallops. 2+ edema to above the knees. 2+ pedal pulses. No carotid bruits.  Abdomen: no tenderness, no masses palpated. No hepatosplenomegaly. Bowel sounds positive.  Musculoskeletal: no clubbing / cyanosis. No joint deformity upper and lower extremities. Poor range of motion, no contractures. Decreased muscle tone Skin: no rashes, lesions, ulcers. No induration Neurologic: CN 2-12 grossly intact. Sensation intact, DTR normal. Decreased strength in bilateral lower extremities Psychiatric: Normal judgment and insight. Alert and oriented x 3. Normal mood.     Labs on Admission: I have personally reviewed following labs and imaging studies  CBC:  Recent Labs Lab 06/21/17 1637  WBC 7.4  HGB 14.1  HCT 41.4  MCV 95.0  PLT 732   Basic Metabolic Panel:  Recent Labs Lab 06/21/17 1637  NA 134*  K 2.9*  CL 96*  CO2 27  GLUCOSE 629*  BUN 23*  CREATININE 1.47*  CALCIUM 9.1   GFR: Estimated Creatinine Clearance: 43.2 mL/min (A) (by C-G formula based on SCr of 1.47 mg/dL  (H)). Liver Function Tests:  Recent Labs Lab 06/21/17 1637  AST 13*  ALT 31  ALKPHOS 178*  BILITOT 1.2  PROT 5.7*  ALBUMIN 3.1*   No results for input(s): LIPASE, AMYLASE in the last 168 hours. No results for input(s): AMMONIA in the last 168 hours. Coagulation Profile: No results for input(s): INR, PROTIME in the last 168 hours. Cardiac Enzymes:  Recent Labs Lab 06/21/17 1637  CKTOTAL 63  TROPONINI 0.32*   BNP (last 3 results) No results for input(s): PROBNP in the last 8760 hours. HbA1C: No results for input(s): HGBA1C in the last 72 hours. CBG:  Recent Labs Lab 06/21/17 1559 06/21/17 1625 06/21/17 1825  GLUCAP 588* >600* 583*   Lipid Profile: No results for input(s): CHOL, HDL, LDLCALC, TRIG, CHOLHDL, LDLDIRECT in the last 72 hours. Thyroid Function Tests: No results for input(s): TSH, T4TOTAL, FREET4, T3FREE, THYROIDAB in the last 72 hours. Anemia Panel: No results for input(s): VITAMINB12, FOLATE, FERRITIN, TIBC, IRON, RETICCTPCT in the last 72 hours. Urine analysis:    Component Value Date/Time   COLORURINE YELLOW 06/21/2017 1601   APPEARANCEUR HAZY (A) 06/21/2017 1601   LABSPEC 1.022 06/21/2017 1601   PHURINE 6.0 06/21/2017 1601   GLUCOSEU >=500 (A) 06/21/2017 1601   HGBUR SMALL (A) 06/21/2017 1601   BILIRUBINUR NEGATIVE 06/21/2017 1601   KETONESUR NEGATIVE 06/21/2017 1601   PROTEINUR >=300 (A) 06/21/2017 1601   UROBILINOGEN 0.2 09/11/2014 1319   NITRITE NEGATIVE 06/21/2017 1601   LEUKOCYTESUR MODERATE (A) 06/21/2017 1601    Radiological Exams on Admission: Dg Lumbar Spine Complete  Result Date: 06/21/2017 CLINICAL DATA:  Acute low back pain for 3 days. EXAM: LUMBAR SPINE - COMPLETE 4+ VIEW COMPARISON:  05/09/2015 and prior studies FINDINGS: No acute fracture noted. Increased severe disc space loss at L3-4 noted with 6 mm anterolisthesis of L3 on L4, new since 05/09/2015.  Moderate to severe degenerative disc disease and spondylosis at L4-5 and  L5-S1 again identified. Mild multilevel degenerative disc disease otherwise noted. No suspicious focal bony lesion or spondylolysis identified. IMPRESSION: 1. Increased severe degenerative disc disease at L3-4 with new grade 1 anterolisthesis at this level. 2. Moderate to severe degenerative changes at L4-5 and L5-S1 again identified. Electronically Signed   By: Margarette Canada M.D.   On: 06/21/2017 17:22   Mr Lumbar Spine Wo Contrast  Result Date: 06/21/2017 CLINICAL DATA:  Weakness for 1 week. History of diabetes. Assess for cauda equina syndrome. EXAM: MRI LUMBAR SPINE WITHOUT CONTRAST TECHNIQUE: Multiplanar, multisequence MR imaging of the lumbar spine was performed. No intravenous contrast was administered. COMPARISON:  Lumbar spine radiographs June 21, 2017 at 1651 hours and MRI of lumbar spine May 09, 2015 FINDINGS: SEGMENTATION: For the purposes of this report, the last well-formed intervertebral disc will be reported as L5-S1. ALIGNMENT: Maintained lumbar lordosis. Grade 1 L3-4 anterolisthesis. Minimal grade 1 L4-5 anterolisthesis. Minimal grade 1 L5-S1 retrolisthesis. No spondylolysis. VERTEBRAE:Vertebral bodies are intact. Severe L3-4 and L5-S1 disc height loss progressed from prior examination. Moderate L4-5 disc height loss with disc desiccation all levels compatible with degenerative discs. Focal edema L3-4 disc. Moderate to severe subacute or chronic discogenic endplate changes M1-D6. Mild L3-4 and L4-5 acute on chronic discogenic endplate changes with trace acute component L3-4. Scalloping posterior L3 vertebral body associated with low T1 and bright T2 signal within Batson's plexus. Minimal bone marrow edema L3-4 facet partially volumes with effusion. CONUS MEDULLARIS: Conus medullaris terminates at L1-2 and demonstrates normal morphology and signal characteristics. Central displacement of the cauda equina with disorganized appearance. PARASPINAL AND SOFT TISSUES: Included prevertebral and  paraspinal soft tissues are nonacute. Multiple small low signal masses within the uterus compatible with leiomyoma. Trace L3-4 interspinous fluid signal. DISC LEVELS: T11-12: Small broad-based disc bulge, mild facet arthropathy and ligamentum flavum redundancy. Mild canal stenosis without neural foraminal narrowing. T12-L1, L1-2: No disc bulge, canal stenosis nor neural foraminal narrowing. L2-3: Small broad-based disc bulge. Minimal facet arthropathy without canal stenosis. No neural foraminal narrowing. L3-4: Anterolisthesis. Large broad-based disc bulge with superimposed LEFT central 5 x 6 mm disc extrusion, 12 mm superior migration. Moderate facet arthropathy and ligamentum flavum redundancy with trace facet effusions. Severe canal stenosis. Severe bilateral neural foraminal narrowing and, encroaching upon the exited L 3 nerve. Lateral recess narrowing may affect the traversing L4 nerves. L4-5: Anterolisthesis. Moderate broad-based disc bulge. Moderate to severe facet arthropathy and ligamentum flavum redundancy with trace facet effusions which are likely reactive. Mild canal stenosis including partially effaced LEFT and RIGHT lateral recesses which may affect the traversing L5 nerves. Moderate to severe RIGHT, severe LEFT neural foraminal narrowing. Encroachment upon at LEFT greater than RIGHT exited L4 nerve. L5-S1: Small to moderate broad-based disc bulge asymmetric to the RIGHT. Mild facet arthropathy. No canal stenosis. Severe bilateral neural foraminal narrowing. IMPRESSION: 1. Grade 1 L3-4 and L4-5 anterolisthesis. Grade 1 L5-S1 retrolisthesis. No spondylolysis. 2. Progressed degenerative change of lumbar spine. Superimposed 5 x 6 x 12 mm L3-4 disc extrusion. 3. Severe canal stenosis L3-4 with resultant disorganized cauda equina. Mild canal stenosis L4-5. 4. Severe neural foraminal narrowing L3-4 through L5-S1. 5. L3-4 inflammatory facet arthropathy. 6. Progressive scalloping of L3 posterior vertebral  body suggesting chronic venous changes. Findings would be better characterized on contrast-enhanced images. Electronically Signed   By: Elon Alas M.D.   On: 06/21/2017 18:26   Dg Chest [facility]  Result Date: 06/21/2017 CLINICAL DATA:  Weakness for 1 week. EXAM: PORTABLE CHEST 1 VIEW COMPARISON:  05/25/2016. FINDINGS: Stable enlarged cardiac silhouette. Mildly prominent pulmonary vasculature with improvement. The interstitial markings are mildly prominent. No pleural fluid seen. Mild bilateral shoulder degenerative changes. IMPRESSION: 1. Mild pulmonary vascular congestion with improvement. 2. Stable cardiomegaly. 3. Mild chronic interstitial lung disease. Electronically Signed   By: Claudie Revering M.D.   On: 06/21/2017 16:52    EKG: Independently reviewed. Shows sinus arrhythmia with PVCs and LVH with repolarization abnormality  Assessment/Plan Principal Problem:   Elevated troponin Active Problems:   Severe left ventricular systolic dysfunction   Spinal stenosis of lumbar region   DM (diabetes mellitus), type 2, uncontrolled, with renal complications (HCC)   Hypokalemia   CKD (chronic kidney disease) stage 3, GFR 30-59 ml/min   Medical non-compliance   Polysubstance abuse (including cocaine)   Cigarette nicotine dependence with nicotine-induced disorder   Hyperlipidemia LDL goal <70    1. Elevated troponin: The patient has a myriad of multiple medical problems that troponin must be evaluated prior to evaluation and management of any other issues by neurosurgery. Therefore she'll be transferred to [facility], for further evaluation and management of likely acute coronary syndrome. I'm unsure as to whether this is an acute ischemic event or a demand ischemia. Given her cocaine use and she has not taken any medications in 2 weeks and then hold off on the beta blocker she will get aspirin she has been given that in the emergency department. She is not having any chest pain whatsoever which  makes acute coronary syndrome less likely. I do not think she is a candidate at the present time for anticoagulation with heparin as she is not having any chest pain.  2. Severe left ventricular systolic dysfunction with acute decompensated congestive heart failure: Patient has a known ejection fraction of 20-25%. Will order echocardiogram will start Lasix 40 mg IV every 12 hours. We'll monitor potassium levels closely. She is oriented on an aspirin and a statin. Will order low-dose ACE inhibitor. Hold beta blocker until more stable. We'll start fluid restriction 1200 mL per day and check daily weights.  3. Spinal stenosis of lumbar region with L3-4 canal stenosis and disorganized cauda equina. Neurosurgery has stated that they will see the patient once she is more medically stabilized. Will ask physical therapy to evaluate the patient. She will likely need extensive physical therapy regardless of whether or not she can have surgery to repair her stenosis.  4. Diabetes type 2 uncontrolled with renal complications and blood glucose of greater than 600: Insulin drip has been started by the emergency department. While she is not an actual diabetic ketoacidosis I believe that the blood glucose this high insulin drip most prudent method to get her blood sugars down. We cannot give her extensive amounts of fluid given her severe cardiomyopathy. Need to be very judicious with use of IV fluids. .  5. Hypokalemia: This will be repleted both orally and IV. We'll monitor response to treatment and check daily potassium levels. Patient has arrhythmia would consider checking more frequently.  6. Chronic kidney disease stage III: Likely secondary to severe uncontrolled diabetes mellitus and uncontrolled hypertension. Will avoid nephrotoxic agents. Avoid dyes and monitor renal function closely.  7. Chronic medical noncompliance: Patient has many issues regarding ability to comply. She has no transportation, she is very  limited budget, and she has a very low health literacy.  8. Polysubstance abuse including cocaine: Patient  currently with cocaine in her drug screen. This might be contributing to her abnormal troponin. Beta blockersheld given the cocaine in her system. Monitor closely. Patient will need education regarding dangers of cocaine use.  9. Nicotine dependence: Patient smokes avidly. Once she is more stable will need to discuss smoking cessation with her.  10. Hyperlipidemia not at goal: Restart current simvastatin dosing and check lipids in the next 2-3 days.  DVT prophylaxis: Lovenox Code Status: Full Family Communication: No family at bedside Disposition Plan: Likely skilled nursing facility given inability to walk Consults called: Dr. Cyndy Freeze from neurosurgery; Dr. Aundra Dubin form Cardiology (DR. Sabra Heck from ED spoke to both consultants) Admission status: Inpatient   Lady Deutscher MD, FACP [facility] Pager [phone]  If 7PM-7AM, please contact night-coverage [email or web address] Password [email or web address]  06/21/2017, 8:30 PM

## 2017-06-21 NOTE — ED Triage Notes (Signed)
Patient complains of weakness x 1 week. States she has not been taking her medication for past 2 weeks. Patient states history of diabetes and stroke.   CBG in triage 588

## 2017-06-21 NOTE — H&P (Deleted)
[name] is an 51 y.o. female.   Chief Complaint: left lower extremity numbness, weakness dorsiflexors by one week HPI: [name] states she has had progressive weakness in the left lower extremity along with numbness for the past week. The pain today was such that she could no longer stand the pain. She has had injections in the lumbar spine approximately three years ago. No previous operations  Past Medical History:  Diagnosis Date  . Alcohol use    Now abstinent.  . Ankle fracture, lateral malleolus, closed 12/30/2011  . Anxiety   . Breast mass, left 12/15/2011  . CKD (chronic kidney disease)   . COPD (chronic obstructive pulmonary disease) ([place])   . Diabetes mellitus, type 2 ([place])   . Diabetic Charcot foot ([place]) 12/30/2011  . Essential hypertension   . History of GI bleed 12/05/2009   Qualifier: Diagnosis of  By: Zeb Comfort    . Hyperlipidemia   . Hypertensive cardiomyopathy ([place]) 11/08/2012   Ejection fraction 40-45%.  . Noncompliance with medication regimen   . Obesity   . Panic attacks   . Tobacco abuse     Past Surgical History:  Procedure Laterality Date  . BREAST BIOPSY    . CARDIAC CATHETERIZATION N/A 07/28/2016   Procedure: Left Heart Cath and Coronary Angiography;  Surgeon: Jettie Booze, MD;  Location: [facility];  Service: Cardiovascular;  Laterality: N/A;  . DILATION AND CURETTAGE OF UTERUS      Family History  Problem Relation Age of Onset  . Hypertension Mother   . Heart attack Mother   . Hypertension Father        CABG   . Hypertension Sister   . Hypertension Brother   . Hypertension Sister   . Cancer Sister        breast   . Arthritis Unknown   . Cancer Unknown   . Diabetes Unknown   . Asthma Unknown   . Hypertension Daughter   . Hypertension Son    Social History:  reports that she has been smoking Cigarettes.  She has a 20.00 pack-year smoking history. She has never used smokeless tobacco. She reports that she uses drugs,  including Marijuana and Cocaine, about 3 times per week. She reports that she does not drink alcohol.  Allergies: No Known Allergies   (Not in a hospital admission)  Results for orders placed or performed during the hospital encounter of 06/21/17 (from the past 48 hour(s))  CBG monitoring, ED     Status: Abnormal   Collection Time: 06/21/17  3:59 PM  Result Value Ref Range   Glucose-Capillary 588 (HH) 65 - 99 mg/dL   Comment 1 Document in Chart   Urinalysis, Routine w reflex microscopic     Status: Abnormal   Collection Time: 06/21/17  4:01 PM  Result Value Ref Range   Color, Urine YELLOW YELLOW   APPearance HAZY (A) CLEAR   Specific Gravity, Urine 1.022 1.005 - 1.030   pH 6.0 5.0 - 8.0   Glucose, UA >=500 (A) NEGATIVE mg/dL   Hgb urine dipstick SMALL (A) NEGATIVE   Bilirubin Urine NEGATIVE NEGATIVE   Ketones, ur NEGATIVE NEGATIVE mg/dL   Protein, ur >=300 (A) NEGATIVE mg/dL   Nitrite NEGATIVE NEGATIVE   Leukocytes, UA MODERATE (A) NEGATIVE   RBC / HPF 6-30 0 - 5 RBC/hpf   WBC, UA 6-30 0 - 5 WBC/hpf   Bacteria, UA RARE (A) NONE SEEN   Squamous Epithelial / LPF  6-30 (A) NONE SEEN  CBG monitoring, ED     Status: Abnormal   Collection Time: 06/21/17  4:25 PM  Result Value Ref Range   Glucose-Capillary >600 (HH) 65 - 99 mg/dL  CBC     Status: None   Collection Time: 06/21/17  4:37 PM  Result Value Ref Range   WBC 7.4 4.0 - 10.5 K/uL   RBC 4.36 3.87 - 5.11 MIL/uL   Hemoglobin 14.1 12.0 - 15.0 g/dL   HCT 02.0 35.5 - 73.3 %   MCV 95.0 78.0 - 100.0 fL   MCH 32.3 26.0 - 34.0 pg   MCHC 34.1 30.0 - 36.0 g/dL   RDW 78.0 10.8 - 10.6 %   Platelets 194 150 - 400 K/uL  Comprehensive metabolic panel     Status: Abnormal   Collection Time: 06/21/17  4:37 PM  Result Value Ref Range   Sodium 134 (L) 135 - 145 mmol/L   Potassium 2.9 (L) 3.5 - 5.1 mmol/L   Chloride 96 (L) 101 - 111 mmol/L   CO2 27 22 - 32 mmol/L   Glucose, Bld 629 (HH) 65 - 99 mg/dL    Comment: CRITICAL RESULT CALLED  TO, READ BACK BY AND VERIFIED WITH: MITCHELL,M ON 06/21/17 AT 1725 BY LOY,C    BUN 23 (H) 6 - 20 mg/dL   Creatinine, Ser 5.39 (H) 0.44 - 1.00 mg/dL   Calcium 9.1 8.9 - 90.8 mg/dL   Total Protein 5.7 (L) 6.5 - 8.1 g/dL   Albumin 3.1 (L) 3.5 - 5.0 g/dL   AST 13 (L) 15 - 41 U/L   ALT 31 14 - 54 U/L   Alkaline Phosphatase 178 (H) 38 - 126 U/L   Total Bilirubin 1.2 0.3 - 1.2 mg/dL   GFR calc non Af Amer 40 (L) >60 mL/min   GFR calc Af Amer 46 (L) >60 mL/min    Comment: (NOTE) The eGFR has been calculated using the CKD EPI equation. This calculation has not been validated in all clinical situations. eGFR's persistently <60 mL/min signify possible Chronic Kidney Disease.    Anion gap 11 5 - 15  Brain natriuretic peptide     Status: Abnormal   Collection Time: 06/21/17  4:37 PM  Result Value Ref Range   B Natriuretic Peptide 1,152.0 (H) 0.0 - 100.0 pg/mL  Troponin I     Status: Abnormal   Collection Time: 06/21/17  4:37 PM  Result Value Ref Range   Troponin I 0.32 (HH) <0.03 ng/mL    Comment: CRITICAL RESULT CALLED TO, READ BACK BY AND VERIFIED WITH: DAVIS,J ON 06/21/17 AT 1750 BY LOY,C   CK     Status: None   Collection Time: 06/21/17  4:37 PM  Result Value Ref Range   Total CK 63 38 - 234 U/L  Ethanol     Status: None   Collection Time: 06/21/17  4:37 PM  Result Value Ref Range   Alcohol, Ethyl (B) <5 <5 mg/dL    Comment:        LOWEST DETECTABLE LIMIT FOR SERUM ALCOHOL IS 5 mg/dL FOR MEDICAL PURPOSES ONLY   Rapid urine drug screen (hospital performed)     Status: Abnormal   Collection Time: 06/21/17  4:50 PM  Result Value Ref Range   Opiates NONE DETECTED NONE DETECTED   Cocaine POSITIVE (A) NONE DETECTED   Benzodiazepines NONE DETECTED NONE DETECTED   Amphetamines NONE DETECTED NONE DETECTED   Tetrahydrocannabinol NONE DETECTED NONE DETECTED   Barbiturates  NONE DETECTED NONE DETECTED    Comment:        DRUG SCREEN FOR MEDICAL PURPOSES ONLY.  IF CONFIRMATION IS  NEEDED FOR ANY PURPOSE, NOTIFY LAB WITHIN 5 DAYS.        LOWEST DETECTABLE LIMITS FOR URINE DRUG SCREEN Drug Class       Cutoff (ng/mL) Amphetamine      1000 Barbiturate      200 Benzodiazepine   546 Tricyclics       270 Opiates          300 Cocaine          300 THC              50   CBG monitoring, ED     Status: Abnormal   Collection Time: 06/21/17  6:25 PM  Result Value Ref Range   Glucose-Capillary 583 (HH) 65 - 99 mg/dL  CBG monitoring, ED     Status: Abnormal   Collection Time: 06/21/17  8:29 PM  Result Value Ref Range   Glucose-Capillary >600 (HH) 65 - 99 mg/dL  Troponin I     Status: Abnormal   Collection Time: 06/21/17  9:29 PM  Result Value Ref Range   Troponin I 0.46 (HH) <0.03 ng/mL    Comment: CRITICAL VALUE NOTED.  VALUE IS CONSISTENT WITH PREVIOUSLY REPORTED AND CALLED VALUE.  CBG monitoring, ED     Status: Abnormal   Collection Time: 06/21/17  9:39 PM  Result Value Ref Range   Glucose-Capillary 427 (H) 65 - 99 mg/dL  CBG monitoring, ED     Status: Abnormal   Collection Time: 06/21/17 10:44 PM  Result Value Ref Range   Glucose-Capillary 294 (H) 65 - 99 mg/dL   Dg Lumbar Spine Complete  Result Date: 06/21/2017 CLINICAL DATA:  Acute low back pain for 3 days. EXAM: LUMBAR SPINE - COMPLETE 4+ VIEW COMPARISON:  05/09/2015 and prior studies FINDINGS: No acute fracture noted. Increased severe disc space loss at L3-4 noted with 6 mm anterolisthesis of L3 on L4, new since 05/09/2015. Moderate to severe degenerative disc disease and spondylosis at L4-5 and L5-S1 again identified. Mild multilevel degenerative disc disease otherwise noted. No suspicious focal bony lesion or spondylolysis identified. IMPRESSION: 1. Increased severe degenerative disc disease at L3-4 with new grade 1 anterolisthesis at this level. 2. Moderate to severe degenerative changes at L4-5 and L5-S1 again identified. Electronically Signed   By: Margarette Canada M.D.   On: 06/21/2017 17:22   Mr Lumbar Spine  Wo Contrast  Result Date: 06/21/2017 CLINICAL DATA:  Weakness for 1 week. History of diabetes. Assess for cauda equina syndrome. EXAM: MRI LUMBAR SPINE WITHOUT CONTRAST TECHNIQUE: Multiplanar, multisequence MR imaging of the lumbar spine was performed. No intravenous contrast was administered. COMPARISON:  Lumbar spine radiographs June 21, 2017 at 1651 hours and MRI of lumbar spine May 09, 2015 FINDINGS: SEGMENTATION: For the purposes of this report, the last well-formed intervertebral disc will be reported as L5-S1. ALIGNMENT: Maintained lumbar lordosis. Grade 1 L3-4 anterolisthesis. Minimal grade 1 L4-5 anterolisthesis. Minimal grade 1 L5-S1 retrolisthesis. No spondylolysis. VERTEBRAE:Vertebral bodies are intact. Severe L3-4 and L5-S1 disc height loss progressed from prior examination. Moderate L4-5 disc height loss with disc desiccation all levels compatible with degenerative discs. Focal edema L3-4 disc. Moderate to severe subacute or chronic discogenic endplate changes J5-K0. Mild L3-4 and L4-5 acute on chronic discogenic endplate changes with trace acute component L3-4. Scalloping posterior L3 vertebral body associated with low T1 and  bright T2 signal within Batson's plexus. Minimal bone marrow edema L3-4 facet partially volumes with effusion. CONUS MEDULLARIS: Conus medullaris terminates at L1-2 and demonstrates normal morphology and signal characteristics. Central displacement of the cauda equina with disorganized appearance. PARASPINAL AND SOFT TISSUES: Included prevertebral and paraspinal soft tissues are nonacute. Multiple small low signal masses within the uterus compatible with leiomyoma. Trace L3-4 interspinous fluid signal. DISC LEVELS: T11-12: Small broad-based disc bulge, mild facet arthropathy and ligamentum flavum redundancy. Mild canal stenosis without neural foraminal narrowing. T12-L1, L1-2: No disc bulge, canal stenosis nor neural foraminal narrowing. L2-3: Small broad-based disc bulge.  Minimal facet arthropathy without canal stenosis. No neural foraminal narrowing. L3-4: Anterolisthesis. Large broad-based disc bulge with superimposed LEFT central 5 x 6 mm disc extrusion, 12 mm superior migration. Moderate facet arthropathy and ligamentum flavum redundancy with trace facet effusions. Severe canal stenosis. Severe bilateral neural foraminal narrowing and, encroaching upon the exited L 3 nerve. Lateral recess narrowing may affect the traversing L4 nerves. L4-5: Anterolisthesis. Moderate broad-based disc bulge. Moderate to severe facet arthropathy and ligamentum flavum redundancy with trace facet effusions which are likely reactive. Mild canal stenosis including partially effaced LEFT and RIGHT lateral recesses which may affect the traversing L5 nerves. Moderate to severe RIGHT, severe LEFT neural foraminal narrowing. Encroachment upon at LEFT greater than RIGHT exited L4 nerve. L5-S1: Small to moderate broad-based disc bulge asymmetric to the RIGHT. Mild facet arthropathy. No canal stenosis. Severe bilateral neural foraminal narrowing. IMPRESSION: 1. Grade 1 L3-4 and L4-5 anterolisthesis. Grade 1 L5-S1 retrolisthesis. No spondylolysis. 2. Progressed degenerative change of lumbar spine. Superimposed 5 x 6 x 12 mm L3-4 disc extrusion. 3. Severe canal stenosis L3-4 with resultant disorganized cauda equina. Mild canal stenosis L4-5. 4. Severe neural foraminal narrowing L3-4 through L5-S1. 5. L3-4 inflammatory facet arthropathy. 6. Progressive scalloping of L3 posterior vertebral body suggesting chronic venous changes. Findings would be better characterized on contrast-enhanced images. Electronically Signed   By: Elon Alas M.D.   On: 06/21/2017 18:26   Dg Chest Port 1 View  Result Date: 06/21/2017 CLINICAL DATA:  Weakness for 1 week. EXAM: PORTABLE CHEST 1 VIEW COMPARISON:  05/25/2016. FINDINGS: Stable enlarged cardiac silhouette. Mildly prominent pulmonary vasculature with improvement. The  interstitial markings are mildly prominent. No pleural fluid seen. Mild bilateral shoulder degenerative changes. IMPRESSION: 1. Mild pulmonary vascular congestion with improvement. 2. Stable cardiomegaly. 3. Mild chronic interstitial lung disease. Electronically Signed   By: Claudie Revering M.D.   On: 06/21/2017 16:52    Review of Systems  Constitutional: Negative.   HENT: Negative.   Eyes: Negative.   Respiratory: Negative.   Cardiovascular: Negative.   Gastrointestinal: Negative.   Genitourinary: Negative.   Musculoskeletal: Positive for back pain.  Skin: Negative.   Neurological: Positive for sensory change and focal weakness.  Endo/Heme/Allergies: Negative.   Psychiatric/Behavioral: Negative.     Blood pressure (!) 145/126, pulse 97, temperature 98.4 F (36.9 C), temperature source Oral, resp. rate 20, height '5\' 1"'$  (1.549 m), weight 83 kg (183 lb), last menstrual period 05/19/2016, SpO2 99 %. Physical Exam  Constitutional: She is oriented to person, place, and time. She appears well-developed and well-nourished. She appears distressed.  HENT:  Head: Normocephalic and atraumatic.  Right Ear: External ear normal.  Left Ear: External ear normal.  Mouth/Throat: Oropharynx is clear and moist.  Eyes: Pupils are equal, round, and reactive to light. Conjunctivae and EOM are normal.  Neck: Normal range of motion. Neck supple.  Cardiovascular: Normal rate,  regular rhythm, normal heart sounds and intact distal pulses.   Respiratory: Effort normal and breath sounds normal.  GI: Soft. Bowel sounds are normal.  Neurological: She is alert and oriented to person, place, and time. She displays tremor. She displays no atrophy. A sensory deficit is present. No cranial nerve deficit. She displays no Babinski's sign on the right side. She displays no Babinski's sign on the left side.  Reflex Scores:      Tricep reflexes are 2+ on the right side and 2+ on the left side.      Bicep reflexes are 2+ on  the right side and 2+ on the left side.      Brachioradialis reflexes are 2+ on the right side and 2+ on the left side.      Patellar reflexes are 2+ on the right side and 2+ on the left side.      Achilles reflexes are 2+ on the right side and 0 on the left side. Left foot drop,  4/5 hip flexors, extensors 4/5 quadriceps 1/5 left dorsiflexors 4/5 plantar flexors Right lower extremity 5/5 Normal strength upper extremities Gait not assessed      Assessment/Plan Admit for OR tomorrow due to HNP at L3/4 eccentric to the right.  BP (!) 145/126   Pulse 97   Temp 98.4 F (36.9 C) (Oral)   Resp 20   Ht _0  (1.549 m)   Wt 83 kg (183 lb)   LMP 05/19/2016   SpO2 99%   BMI 34.58 kg/m  [name] has decided to undergo a lumbar discetomy/decompression for a herniated disc at levels L3/4. Risks and benefits including but not limited to bleeding, infection, paralysis, weakness in one or both extremities, bowel and/or bladder dysfunction, need for further surgery, no relief of pain. [name] understands and wishes to proceed.  Trendon Zaring L, MD 06/21/2017, 11:55 PM
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
## 2017-06-21 NOTE — ED Notes (Signed)
CRITICAL VALUE ALERT  Critical Value:  Glucose 629  Date & Time Notied:  06/21/17 1728  Provider Notified: Sabra Heck   Orders Received/Actions taken: none

## 2017-06-21 NOTE — Progress Notes (Signed)
I have been called by Dr. Sabra Heck at [facility] to inform me that [name] is being transferred to [facility] for medical care. She has congestive heart failure, is most likely having a myocardial infarction,is hyperglycemic, with a  Measured cbg of 629, and that she complains of back pain. At this time there are no surgical options available. She is listhesed at L3/4 with severe stenosis, would not expect this to be cauda equina syndrome. Please call when medically stable, thank you.

## 2017-06-21 NOTE — ED Notes (Signed)
Pt refused in and out cath

## 2017-06-22 ENCOUNTER — Inpatient Hospital Stay (HOSPITAL_COMMUNITY): Payer: Medicare Other

## 2017-06-22 ENCOUNTER — Encounter (HOSPITAL_COMMUNITY): Payer: Self-pay | Admitting: Student

## 2017-06-22 DIAGNOSIS — G834 Cauda equina syndrome: Secondary | ICD-10-CM | POA: Diagnosis not present

## 2017-06-22 DIAGNOSIS — E876 Hypokalemia: Secondary | ICD-10-CM | POA: Diagnosis not present

## 2017-06-22 DIAGNOSIS — I509 Heart failure, unspecified: Secondary | ICD-10-CM

## 2017-06-22 DIAGNOSIS — Z7951 Long term (current) use of inhaled steroids: Secondary | ICD-10-CM | POA: Diagnosis not present

## 2017-06-22 DIAGNOSIS — F1910 Other psychoactive substance abuse, uncomplicated: Secondary | ICD-10-CM

## 2017-06-22 DIAGNOSIS — E1122 Type 2 diabetes mellitus with diabetic chronic kidney disease: Secondary | ICD-10-CM | POA: Diagnosis not present

## 2017-06-22 DIAGNOSIS — I6340 Cerebral infarction due to embolism of unspecified cerebral artery: Secondary | ICD-10-CM | POA: Diagnosis not present

## 2017-06-22 DIAGNOSIS — I130 Hypertensive heart and chronic kidney disease with heart failure and stage 1 through stage 4 chronic kidney disease, or unspecified chronic kidney disease: Secondary | ICD-10-CM | POA: Diagnosis not present

## 2017-06-22 DIAGNOSIS — E669 Obesity, unspecified: Secondary | ICD-10-CM

## 2017-06-22 DIAGNOSIS — N183 Chronic kidney disease, stage 3 (moderate): Secondary | ICD-10-CM

## 2017-06-22 DIAGNOSIS — I341 Nonrheumatic mitral (valve) prolapse: Secondary | ICD-10-CM

## 2017-06-22 DIAGNOSIS — R748 Abnormal levels of other serum enzymes: Secondary | ICD-10-CM

## 2017-06-22 DIAGNOSIS — R739 Hyperglycemia, unspecified: Secondary | ICD-10-CM

## 2017-06-22 DIAGNOSIS — I5023 Acute on chronic systolic (congestive) heart failure: Secondary | ICD-10-CM | POA: Diagnosis not present

## 2017-06-22 DIAGNOSIS — F17219 Nicotine dependence, cigarettes, with unspecified nicotine-induced disorders: Secondary | ICD-10-CM

## 2017-06-22 DIAGNOSIS — E11610 Type 2 diabetes mellitus with diabetic neuropathic arthropathy: Secondary | ICD-10-CM | POA: Diagnosis not present

## 2017-06-22 DIAGNOSIS — I471 Supraventricular tachycardia: Secondary | ICD-10-CM | POA: Diagnosis not present

## 2017-06-22 DIAGNOSIS — Z01810 Encounter for preprocedural cardiovascular examination: Secondary | ICD-10-CM

## 2017-06-22 DIAGNOSIS — G9341 Metabolic encephalopathy: Secondary | ICD-10-CM | POA: Diagnosis not present

## 2017-06-22 DIAGNOSIS — Z803 Family history of malignant neoplasm of breast: Secondary | ICD-10-CM | POA: Diagnosis not present

## 2017-06-22 DIAGNOSIS — M48061 Spinal stenosis, lumbar region without neurogenic claudication: Secondary | ICD-10-CM

## 2017-06-22 DIAGNOSIS — E785 Hyperlipidemia, unspecified: Secondary | ICD-10-CM

## 2017-06-22 DIAGNOSIS — I248 Other forms of acute ischemic heart disease: Secondary | ICD-10-CM | POA: Diagnosis not present

## 2017-06-22 DIAGNOSIS — Z9119 Patient's noncompliance with other medical treatment and regimen: Secondary | ICD-10-CM

## 2017-06-22 DIAGNOSIS — Z7902 Long term (current) use of antithrombotics/antiplatelets: Secondary | ICD-10-CM | POA: Diagnosis not present

## 2017-06-22 DIAGNOSIS — I214 Non-ST elevation (NSTEMI) myocardial infarction: Secondary | ICD-10-CM

## 2017-06-22 DIAGNOSIS — E872 Acidosis: Secondary | ICD-10-CM | POA: Diagnosis not present

## 2017-06-22 DIAGNOSIS — E874 Mixed disorder of acid-base balance: Secondary | ICD-10-CM | POA: Diagnosis not present

## 2017-06-22 DIAGNOSIS — I519 Heart disease, unspecified: Secondary | ICD-10-CM

## 2017-06-22 DIAGNOSIS — J9601 Acute respiratory failure with hypoxia: Secondary | ICD-10-CM | POA: Diagnosis not present

## 2017-06-22 DIAGNOSIS — Z8249 Family history of ischemic heart disease and other diseases of the circulatory system: Secondary | ICD-10-CM | POA: Diagnosis not present

## 2017-06-22 DIAGNOSIS — I255 Ischemic cardiomyopathy: Secondary | ICD-10-CM | POA: Diagnosis not present

## 2017-06-22 DIAGNOSIS — N179 Acute kidney failure, unspecified: Secondary | ICD-10-CM | POA: Diagnosis not present

## 2017-06-22 DIAGNOSIS — I420 Dilated cardiomyopathy: Secondary | ICD-10-CM

## 2017-06-22 DIAGNOSIS — E1169 Type 2 diabetes mellitus with other specified complication: Secondary | ICD-10-CM

## 2017-06-22 LAB — BASIC METABOLIC PANEL
Anion gap: 7 (ref 5–15)
BUN: 20 mg/dL (ref 6–20)
CO2: 26 mmol/L (ref 22–32)
Calcium: 8.2 mg/dL — ABNORMAL LOW (ref 8.9–10.3)
Chloride: 107 mmol/L (ref 101–111)
Creatinine, Ser: 1.36 mg/dL — ABNORMAL HIGH (ref 0.44–1.00)
GFR calc Af Amer: 50 mL/min — ABNORMAL LOW (ref >60)
GFR calc non Af Amer: 44 mL/min — ABNORMAL LOW (ref >60)
Glucose, Bld: 107 mg/dL — ABNORMAL HIGH (ref 65–99)
Potassium: 3.2 mmol/L — ABNORMAL LOW (ref 3.5–5.1)
Sodium: 140 mmol/L (ref 135–145)

## 2017-06-22 LAB — GLUCOSE, CAPILLARY
Glucose-Capillary: 155 mg/dL — ABNORMAL HIGH (ref 65–99)
Glucose-Capillary: 160 mg/dL — ABNORMAL HIGH (ref 65–99)
Glucose-Capillary: 182 mg/dL — ABNORMAL HIGH (ref 65–99)
Glucose-Capillary: 177 mg/dL — ABNORMAL HIGH (ref 65–99)

## 2017-06-22 LAB — CBG MONITORING, ED
Glucose-Capillary: 113 mg/dL — ABNORMAL HIGH (ref 65–99)
Glucose-Capillary: 127 mg/dL — ABNORMAL HIGH (ref 65–99)
Glucose-Capillary: 131 mg/dL — ABNORMAL HIGH (ref 65–99)
Glucose-Capillary: 148 mg/dL — ABNORMAL HIGH (ref 65–99)
Glucose-Capillary: 189 mg/dL — ABNORMAL HIGH (ref 65–99)
Glucose-Capillary: 244 mg/dL — ABNORMAL HIGH (ref 65–99)
Glucose-Capillary: 67 mg/dL (ref 65–99)
Glucose-Capillary: 79 mg/dL (ref 65–99)

## 2017-06-22 LAB — CBC
HCT: 41.5 % (ref 36.0–46.0)
Hemoglobin: 13.6 g/dL (ref 12.0–15.0)
MCH: 31.7 pg (ref 26.0–34.0)
MCHC: 32.8 g/dL (ref 30.0–36.0)
MCV: 96.7 fL (ref 78.0–100.0)
Platelets: 194 10*3/uL (ref 150–400)
RBC: 4.29 MIL/uL (ref 3.87–5.11)
RDW: 14.2 % (ref 11.5–15.5)
WBC: 8.1 10*3/uL (ref 4.0–10.5)

## 2017-06-22 LAB — ECHOCARDIOGRAM COMPLETE
Height: 61 in
Weight: 2928 oz

## 2017-06-22 LAB — MAGNESIUM: Magnesium: 1.9 mg/dL (ref 1.7–2.4)

## 2017-06-22 LAB — TROPONIN I: Troponin I: 0.41 ng/mL (ref <0.03)

## 2017-06-22 LAB — BRAIN NATRIURETIC PEPTIDE: B Natriuretic Peptide: 835 pg/mL — ABNORMAL HIGH (ref 0.0–100.0)

## 2017-06-22 MED ORDER — BISOPROLOL FUMARATE 5 MG PO TABS
5.0000 mg | ORAL_TABLET | Freq: Every day | ORAL | Status: DC
  Administered 2017-06-22 – 2017-06-23 (×2): 5 mg via ORAL
  Filled 2017-06-22 (×2): qty 1

## 2017-06-22 MED ORDER — INSULIN ASPART 100 UNIT/ML ~~LOC~~ SOLN
0.0000 [IU] | Freq: Three times a day (TID) | SUBCUTANEOUS | Status: DC
  Administered 2017-06-22 – 2017-06-23 (×2): 2 [IU] via SUBCUTANEOUS
  Administered 2017-06-23: 3 [IU] via SUBCUTANEOUS
  Administered 2017-06-24 (×2): 2 [IU] via SUBCUTANEOUS

## 2017-06-22 MED ORDER — INSULIN ASPART 100 UNIT/ML ~~LOC~~ SOLN
0.0000 [IU] | Freq: Every day | SUBCUTANEOUS | Status: DC
  Administered 2017-06-23: 1 [IU] via SUBCUTANEOUS

## 2017-06-22 MED ORDER — INSULIN ASPART 100 UNIT/ML ~~LOC~~ SOLN
3.0000 [IU] | Freq: Three times a day (TID) | SUBCUTANEOUS | Status: DC
  Administered 2017-06-22 – 2017-06-24 (×4): 3 [IU] via SUBCUTANEOUS

## 2017-06-22 MED ORDER — WHITE PETROLATUM GEL
Status: AC
  Filled 2017-06-22: qty 1

## 2017-06-22 MED ORDER — FUROSEMIDE 10 MG/ML IJ SOLN
40.0000 mg | Freq: Every day | INTRAMUSCULAR | Status: DC
  Administered 2017-06-23: 40 mg via INTRAVENOUS
  Filled 2017-06-22: qty 4

## 2017-06-22 NOTE — ED Notes (Signed)
Hospitalist paged for insulin order. Pt has had 2 consecutive normal range bs.

## 2017-06-22 NOTE — ED Notes (Signed)
Per Dr. Darrick Meigs: stop glucose stabilizer, continue d5 1/2 NS for 30 min then recheck BS.

## 2017-06-22 NOTE — Consult Note (Signed)
BP (!) 138/106 (BP Location: Left Arm)   Pulse 77   Temp 98.3 F (36.8 C) (Oral)   Resp 18   Ht '5\' 1"'  (1.549 m)   Wt 83 kg (183 lb)   LMP 05/19/2016   SpO2 98%   BMI 34.58 kg/m  Reason for Consult:lower extremity weakness Referring Physician: Ahmari Garton is an 51 y.o. female.  HPI: with weakness on presentation in the lower extremities. Veracity of patient is of question.   Past Medical History:  Diagnosis Date  . Alcohol use   . Ankle fracture, lateral malleolus, closed 12/30/2011  . Anxiety   . Breast mass, left 12/15/2011  . CKD (chronic kidney disease)   . COPD (chronic obstructive pulmonary disease) ([place])   . Diabetes mellitus, type 2 ([place])   . Diabetic Charcot foot ([place]) 12/30/2011  . Essential hypertension   . History of GI bleed 12/05/2009   Qualifier: Diagnosis of  By: Zeb Comfort    . Hyperlipidemia   . Hypertensive cardiomyopathy ([place]) 11/08/2012   a. 05/2016: echo showing EF of 20-25% with normal cors by cath in 07/2016.  Marland Kitchen Noncompliance with medication regimen   . Obesity   . Panic attacks   . Tobacco abuse     Past Surgical History:  Procedure Laterality Date  . BREAST BIOPSY    . CARDIAC CATHETERIZATION N/A 07/28/2016   Procedure: Left Heart Cath and Coronary Angiography;  Surgeon: Jettie Booze, MD;  Location: [facility];  Service: Cardiovascular;  Laterality: N/A;  . DILATION AND CURETTAGE OF UTERUS      Family History  Problem Relation Age of Onset  . Hypertension Mother   . Heart attack Mother   . Hypertension Father        CABG   . Hypertension Sister   . Hypertension Brother   . Hypertension Sister   . Cancer Sister        breast   . Arthritis Unknown   . Cancer Unknown   . Diabetes Unknown   . Asthma Unknown   . Hypertension Daughter   . Hypertension Son     Social History:  reports that she has been smoking Cigarettes.  She has a 20.00 pack-year smoking history. She has never used smokeless tobacco. She  reports that she uses drugs, including Marijuana and Cocaine, about 3 times per week. She reports that she does not drink alcohol.  Allergies: No Known Allergies  Medications: I have reviewed the patient's current medications.  Results for orders placed or performed during the hospital encounter of 06/21/17 (from the past 48 hour(s))  CBG monitoring, ED     Status: Abnormal   Collection Time: 06/21/17  3:59 PM  Result Value Ref Range   Glucose-Capillary 588 (HH) 65 - 99 mg/dL   Comment 1 Document in Chart   Urinalysis, Routine w reflex microscopic     Status: Abnormal   Collection Time: 06/21/17  4:01 PM  Result Value Ref Range   Color, Urine YELLOW YELLOW   APPearance HAZY (A) CLEAR   Specific Gravity, Urine 1.022 1.005 - 1.030   pH 6.0 5.0 - 8.0   Glucose, UA >=500 (A) NEGATIVE mg/dL   Hgb urine dipstick SMALL (A) NEGATIVE   Bilirubin Urine NEGATIVE NEGATIVE   Ketones, ur NEGATIVE NEGATIVE mg/dL   Protein, ur >=300 (A) NEGATIVE mg/dL   Nitrite NEGATIVE NEGATIVE   Leukocytes, UA MODERATE (A) NEGATIVE   RBC / HPF 6-30 0 -  5 RBC/hpf   WBC, UA 6-30 0 - 5 WBC/hpf   Bacteria, UA RARE (A) NONE SEEN   Squamous Epithelial / LPF 6-30 (A) NONE SEEN  CBG monitoring, ED     Status: Abnormal   Collection Time: 06/21/17  4:25 PM  Result Value Ref Range   Glucose-Capillary >600 (HH) 65 - 99 mg/dL  CBC     Status: None   Collection Time: 06/21/17  4:37 PM  Result Value Ref Range   WBC 7.4 4.0 - 10.5 K/uL   RBC 4.36 3.87 - 5.11 MIL/uL   Hemoglobin 14.1 12.0 - 15.0 g/dL   HCT 41.4 36.0 - 46.0 %   MCV 95.0 78.0 - 100.0 fL   MCH 32.3 26.0 - 34.0 pg   MCHC 34.1 30.0 - 36.0 g/dL   RDW 13.8 11.5 - 15.5 %   Platelets 194 150 - 400 K/uL  Comprehensive metabolic panel     Status: Abnormal   Collection Time: 06/21/17  4:37 PM  Result Value Ref Range   Sodium 134 (L) 135 - 145 mmol/L   Potassium 2.9 (L) 3.5 - 5.1 mmol/L   Chloride 96 (L) 101 - 111 mmol/L   CO2 27 22 - 32 mmol/L   Glucose,  Bld 629 (HH) 65 - 99 mg/dL    Comment: CRITICAL RESULT CALLED TO, READ BACK BY AND VERIFIED WITH: MITCHELL,M ON 06/21/17 AT 1725 BY LOY,C    BUN 23 (H) 6 - 20 mg/dL   Creatinine, Ser 1.47 (H) 0.44 - 1.00 mg/dL   Calcium 9.1 8.9 - 10.3 mg/dL   Total Protein 5.7 (L) 6.5 - 8.1 g/dL   Albumin 3.1 (L) 3.5 - 5.0 g/dL   AST 13 (L) 15 - 41 U/L   ALT 31 14 - 54 U/L   Alkaline Phosphatase 178 (H) 38 - 126 U/L   Total Bilirubin 1.2 0.3 - 1.2 mg/dL   GFR calc non Af Amer 40 (L) >60 mL/min   GFR calc Af Amer 46 (L) >60 mL/min    Comment: (NOTE) The eGFR has been calculated using the CKD EPI equation. This calculation has not been validated in all clinical situations. eGFR's persistently <60 mL/min signify possible Chronic Kidney Disease.    Anion gap 11 5 - 15  Brain natriuretic peptide     Status: Abnormal   Collection Time: 06/21/17  4:37 PM  Result Value Ref Range   B Natriuretic Peptide 1,152.0 (H) 0.0 - 100.0 pg/mL  Troponin I     Status: Abnormal   Collection Time: 06/21/17  4:37 PM  Result Value Ref Range   Troponin I 0.32 (HH) <0.03 ng/mL    Comment: CRITICAL RESULT CALLED TO, READ BACK BY AND VERIFIED WITH: DAVIS,J ON 06/21/17 AT 1750 BY LOY,C   CK     Status: None   Collection Time: 06/21/17  4:37 PM  Result Value Ref Range   Total CK 63 38 - 234 U/L  Ethanol     Status: None   Collection Time: 06/21/17  4:37 PM  Result Value Ref Range   Alcohol, Ethyl (B) <5 <5 mg/dL    Comment:        LOWEST DETECTABLE LIMIT FOR SERUM ALCOHOL IS 5 mg/dL FOR MEDICAL PURPOSES ONLY   Rapid urine drug screen (hospital performed)     Status: Abnormal   Collection Time: 06/21/17  4:50 PM  Result Value Ref Range   Opiates NONE DETECTED NONE DETECTED   Cocaine POSITIVE (A) NONE  DETECTED   Benzodiazepines NONE DETECTED NONE DETECTED   Amphetamines NONE DETECTED NONE DETECTED   Tetrahydrocannabinol NONE DETECTED NONE DETECTED   Barbiturates NONE DETECTED NONE DETECTED    Comment:         DRUG SCREEN FOR MEDICAL PURPOSES ONLY.  IF CONFIRMATION IS NEEDED FOR ANY PURPOSE, NOTIFY LAB WITHIN 5 DAYS.        LOWEST DETECTABLE LIMITS FOR URINE DRUG SCREEN Drug Class       Cutoff (ng/mL) Amphetamine      1000 Barbiturate      200 Benzodiazepine   544 Tricyclics       920 Opiates          300 Cocaine          300 THC              50   CBG monitoring, ED     Status: Abnormal   Collection Time: 06/21/17  6:25 PM  Result Value Ref Range   Glucose-Capillary 583 (HH) 65 - 99 mg/dL  CBG monitoring, ED     Status: Abnormal   Collection Time: 06/21/17  8:29 PM  Result Value Ref Range   Glucose-Capillary >600 (HH) 65 - 99 mg/dL  Troponin I     Status: Abnormal   Collection Time: 06/21/17  9:29 PM  Result Value Ref Range   Troponin I 0.46 (HH) <0.03 ng/mL    Comment: CRITICAL VALUE NOTED.  VALUE IS CONSISTENT WITH PREVIOUSLY REPORTED AND CALLED VALUE.  CBG monitoring, ED     Status: Abnormal   Collection Time: 06/21/17  9:39 PM  Result Value Ref Range   Glucose-Capillary 427 (H) 65 - 99 mg/dL  CBG monitoring, ED     Status: Abnormal   Collection Time: 06/21/17 10:44 PM  Result Value Ref Range   Glucose-Capillary 294 (H) 65 - 99 mg/dL  CBG monitoring, ED     Status: Abnormal   Collection Time: 06/22/17 12:09 AM  Result Value Ref Range   Glucose-Capillary 244 (H) 65 - 99 mg/dL  CBG monitoring, ED     Status: Abnormal   Collection Time: 06/22/17  1:21 AM  Result Value Ref Range   Glucose-Capillary 189 (H) 65 - 99 mg/dL  CBG monitoring, ED     Status: Abnormal   Collection Time: 06/22/17  2:29 AM  Result Value Ref Range   Glucose-Capillary 148 (H) 65 - 99 mg/dL  Troponin I     Status: Abnormal   Collection Time: 06/22/17  3:37 AM  Result Value Ref Range   Troponin I 0.41 (HH) <0.03 ng/mL    Comment: CRITICAL RESULT CALLED TO, READ BACK BY AND VERIFIED WITH: TALBOT,T'@0425'  BY MATTHEWS,B 8.28.18   CBC     Status: None   Collection Time: 06/22/17  3:37 AM  Result  Value Ref Range   WBC 8.1 4.0 - 10.5 K/uL   RBC 4.29 3.87 - 5.11 MIL/uL   Hemoglobin 13.6 12.0 - 15.0 g/dL   HCT 41.5 36.0 - 46.0 %   MCV 96.7 78.0 - 100.0 fL   MCH 31.7 26.0 - 34.0 pg   MCHC 32.8 30.0 - 36.0 g/dL   RDW 14.2 11.5 - 15.5 %   Platelets 194 150 - 400 K/uL  Brain natriuretic peptide     Status: Abnormal   Collection Time: 06/22/17  3:37 AM  Result Value Ref Range   B Natriuretic Peptide 835.0 (H) 0.0 - 100.0 pg/mL  Basic metabolic panel  Status: Abnormal   Collection Time: 06/22/17  3:37 AM  Result Value Ref Range   Sodium 140 135 - 145 mmol/L   Potassium 3.2 (L) 3.5 - 5.1 mmol/L   Chloride 107 101 - 111 mmol/L   CO2 26 22 - 32 mmol/L   Glucose, Bld 107 (H) 65 - 99 mg/dL   BUN 20 6 - 20 mg/dL   Creatinine, Ser 1.36 (H) 0.44 - 1.00 mg/dL   Calcium 8.2 (L) 8.9 - 10.3 mg/dL   GFR calc non Af Amer 44 (L) >60 mL/min   GFR calc Af Amer 50 (L) >60 mL/min    Comment: (NOTE) The eGFR has been calculated using the CKD EPI equation. This calculation has not been validated in all clinical situations. eGFR's persistently <60 mL/min signify possible Chronic Kidney Disease.    Anion gap 7 5 - 15  Magnesium     Status: None   Collection Time: 06/22/17  3:37 AM  Result Value Ref Range   Magnesium 1.9 1.7 - 2.4 mg/dL  CBG monitoring, ED     Status: Abnormal   Collection Time: 06/22/17  3:40 AM  Result Value Ref Range   Glucose-Capillary 113 (H) 65 - 99 mg/dL  CBG monitoring, ED     Status: None   Collection Time: 06/22/17  4:54 AM  Result Value Ref Range   Glucose-Capillary 67 65 - 99 mg/dL  CBG monitoring, ED     Status: None   Collection Time: 06/22/17  6:15 AM  Result Value Ref Range   Glucose-Capillary 79 65 - 99 mg/dL  CBG monitoring, ED     Status: Abnormal   Collection Time: 06/22/17  7:22 AM  Result Value Ref Range   Glucose-Capillary 127 (H) 65 - 99 mg/dL  CBG monitoring, ED     Status: Abnormal   Collection Time: 06/22/17  8:29 AM  Result Value Ref  Range   Glucose-Capillary 131 (H) 65 - 99 mg/dL  Glucose, capillary     Status: Abnormal   Collection Time: 06/22/17 11:29 AM  Result Value Ref Range   Glucose-Capillary 155 (H) 65 - 99 mg/dL  Glucose, capillary     Status: Abnormal   Collection Time: 06/22/17  3:22 PM  Result Value Ref Range   Glucose-Capillary 160 (H) 65 - 99 mg/dL  Glucose, capillary     Status: Abnormal   Collection Time: 06/22/17  5:37 PM  Result Value Ref Range   Glucose-Capillary 182 (H) 65 - 99 mg/dL    Dg Lumbar Spine Complete  Result Date: 06/21/2017 CLINICAL DATA:  Acute low back pain for 3 days. EXAM: LUMBAR SPINE - COMPLETE 4+ VIEW COMPARISON:  05/09/2015 and prior studies FINDINGS: No acute fracture noted. Increased severe disc space loss at L3-4 noted with 6 mm anterolisthesis of L3 on L4, new since 05/09/2015. Moderate to severe degenerative disc disease and spondylosis at L4-5 and L5-S1 again identified. Mild multilevel degenerative disc disease otherwise noted. No suspicious focal bony lesion or spondylolysis identified. IMPRESSION: 1. Increased severe degenerative disc disease at L3-4 with new grade 1 anterolisthesis at this level. 2. Moderate to severe degenerative changes at L4-5 and L5-S1 again identified. Electronically Signed   By: Margarette Canada M.D.   On: 06/21/2017 17:22   Ct Head Wo Contrast  Result Date: 06/22/2017 CLINICAL DATA:  Left lower extremity weakness beginning yesterday. EXAM: CT HEAD WITHOUT CONTRAST TECHNIQUE: Contiguous axial images were obtained from the base of the skull through the vertex without intravenous  contrast. COMPARISON:  04/03/2015 FINDINGS: Brain: Premature chronic small vessel ischemia with remote lacunar infarcts in the bilateral thalamus and left caudate head. Small remote bilateral cerebellar infarcts. Ischemic gliosis in the cerebral white matter. No evidence of acute infarct, hemorrhage, hydrocephalus, or masslike findings. Vascular: Vertebrobasilar tortuosity.  Atherosclerotic calcification. No hyperdense vessel. Skull: No acute or aggressive finding Sinuses/Orbits: Remote blowout fracture of the medial wall left orbit. IMPRESSION: 1. No acute finding. 2. Premature chronic small vessel ischemia with remote lacunar infarcts, as described. Electronically Signed   By: Monte Fantasia M.D.   On: 06/22/2017 07:22   Mr Lumbar Spine Wo Contrast  Result Date: 06/21/2017 CLINICAL DATA:  Weakness for 1 week. History of diabetes. Assess for cauda equina syndrome. EXAM: MRI LUMBAR SPINE WITHOUT CONTRAST TECHNIQUE: Multiplanar, multisequence MR imaging of the lumbar spine was performed. No intravenous contrast was administered. COMPARISON:  Lumbar spine radiographs June 21, 2017 at 1651 hours and MRI of lumbar spine May 09, 2015 FINDINGS: SEGMENTATION: For the purposes of this report, the last well-formed intervertebral disc will be reported as L5-S1. ALIGNMENT: Maintained lumbar lordosis. Grade 1 L3-4 anterolisthesis. Minimal grade 1 L4-5 anterolisthesis. Minimal grade 1 L5-S1 retrolisthesis. No spondylolysis. VERTEBRAE:Vertebral bodies are intact. Severe L3-4 and L5-S1 disc height loss progressed from prior examination. Moderate L4-5 disc height loss with disc desiccation all levels compatible with degenerative discs. Focal edema L3-4 disc. Moderate to severe subacute or chronic discogenic endplate changes J8-A4. Mild L3-4 and L4-5 acute on chronic discogenic endplate changes with trace acute component L3-4. Scalloping posterior L3 vertebral body associated with low T1 and bright T2 signal within Batson's plexus. Minimal bone marrow edema L3-4 facet partially volumes with effusion. CONUS MEDULLARIS: Conus medullaris terminates at L1-2 and demonstrates normal morphology and signal characteristics. Central displacement of the cauda equina with disorganized appearance. PARASPINAL AND SOFT TISSUES: Included prevertebral and paraspinal soft tissues are nonacute. Multiple small low  signal masses within the uterus compatible with leiomyoma. Trace L3-4 interspinous fluid signal. DISC LEVELS: T11-12: Small broad-based disc bulge, mild facet arthropathy and ligamentum flavum redundancy. Mild canal stenosis without neural foraminal narrowing. T12-L1, L1-2: No disc bulge, canal stenosis nor neural foraminal narrowing. L2-3: Small broad-based disc bulge. Minimal facet arthropathy without canal stenosis. No neural foraminal narrowing. L3-4: Anterolisthesis. Large broad-based disc bulge with superimposed LEFT central 5 x 6 mm disc extrusion, 12 mm superior migration. Moderate facet arthropathy and ligamentum flavum redundancy with trace facet effusions. Severe canal stenosis. Severe bilateral neural foraminal narrowing and, encroaching upon the exited L 3 nerve. Lateral recess narrowing may affect the traversing L4 nerves. L4-5: Anterolisthesis. Moderate broad-based disc bulge. Moderate to severe facet arthropathy and ligamentum flavum redundancy with trace facet effusions which are likely reactive. Mild canal stenosis including partially effaced LEFT and RIGHT lateral recesses which may affect the traversing L5 nerves. Moderate to severe RIGHT, severe LEFT neural foraminal narrowing. Encroachment upon at LEFT greater than RIGHT exited L4 nerve. L5-S1: Small to moderate broad-based disc bulge asymmetric to the RIGHT. Mild facet arthropathy. No canal stenosis. Severe bilateral neural foraminal narrowing. IMPRESSION: 1. Grade 1 L3-4 and L4-5 anterolisthesis. Grade 1 L5-S1 retrolisthesis. No spondylolysis. 2. Progressed degenerative change of lumbar spine. Superimposed 5 x 6 x 12 mm L3-4 disc extrusion. 3. Severe canal stenosis L3-4 with resultant disorganized cauda equina. Mild canal stenosis L4-5. 4. Severe neural foraminal narrowing L3-4 through L5-S1. 5. L3-4 inflammatory facet arthropathy. 6. Progressive scalloping of L3 posterior vertebral body suggesting chronic venous changes. Findings would be  better characterized on contrast-enhanced images. Electronically Signed   By: Elon Alas M.D.   On: 06/21/2017 18:26   Dg Chest Port 1 View  Result Date: 06/21/2017 CLINICAL DATA:  Weakness for 1 week. EXAM: PORTABLE CHEST 1 VIEW COMPARISON:  05/25/2016. FINDINGS: Stable enlarged cardiac silhouette. Mildly prominent pulmonary vasculature with improvement. The interstitial markings are mildly prominent. No pleural fluid seen. Mild bilateral shoulder degenerative changes. IMPRESSION: 1. Mild pulmonary vascular congestion with improvement. 2. Stable cardiomegaly. 3. Mild chronic interstitial lung disease. Electronically Signed   By: Claudie Revering M.D.   On: 06/21/2017 16:52    Review of Systems  Constitutional: Positive for malaise/fatigue.  Eyes: Negative.   Respiratory: Positive for cough and wheezing.   Cardiovascular: Positive for leg swelling.  Gastrointestinal: Negative.   Genitourinary: Negative.   Musculoskeletal: Positive for back pain and falls.  Skin: Negative.   Neurological: Positive for weakness.  Psychiatric/Behavioral: Negative.    Blood pressure (!) 138/106, pulse 77, temperature 98.3 F (36.8 C), temperature source Oral, resp. rate 18, height '5\' 1"'  (1.549 m), weight 83 kg (183 lb), last menstrual period 05/19/2016, SpO2 98 %. Physical Exam  Constitutional: She is oriented to person, place, and time. She appears well-developed and well-nourished. She appears distressed.  HENT:  Head: Normocephalic and atraumatic.  Eyes: Pupils are equal, round, and reactive to light. Conjunctivae and EOM are normal.  Neck: Normal range of motion. Neck supple.  Neurological: She is alert and oriented to person, place, and time. She has normal reflexes. She displays no atrophy and no tremor. No cranial nerve deficit. She exhibits normal muscle tone. She displays no seizure activity. Coordination normal.  Mild weakness right dorsiflexors, left hip flexors Moving lower extremities fairly  well Proprioception intact in the lower extremities     Assessment/Plan: Will await the findings from the medical team. Does have an hnp at L3/4 on the left side. This disc would not cause her to lose all strength in the lower extremities. Will follow, have spoken with her family [name] L 06/22/2017, 7:46 PM

## 2017-06-22 NOTE — Progress Notes (Signed)
PROGRESS NOTE    [name]  VEL:[id number] DOB: 1966-03-21 DOA: 06/21/2017 PCP: [name], MD   Chief Complaint  Patient presents with  . Weakness  . Hyperglycemia    Brief Narrative:  HPI on 06/21/17 by Dr. Corey Harold is a 51 y.o. female with medical history significant of dilated cardiomyopathy with ejection fraction of 20-25%, diabetes mellitus type 2 uncontrolled with chronic kidney disease stage III, uncontrolled hypertension, cocaine abuse, tobacco abuse, chronic obstructive pulmonary disease, hyperlipidemia, and medical noncompliance who presents the emergency department stating that she can no longer walk her legs won't hold her up.  Patient states that she has not been taking any of her medications for her heart in 2 months. She stopped her diabetes medications 2 weeks ago. She lives by herself in a motel. Home health was coming to help her until the home health company went out of business. Since that time her sinus bedtime to help take care of her. She is usually able to walk over the last couple of days she's become increasingly weak late last night or very early this morning she was unable to get up off the commode because of bilateral leg weakness. She slid to the ground and crawled out to the main area of the motel room where she remained in a crosslegged sitting pattern until family came approximately 12 hours later. She didn't call 911 because she did not want to come to the hospital.  Ultimately when family found her and they convinced her come into the emergency department. She has complaints of increased swelling in her legs (not taking her Lasix) and shortness of breath. She feels generally ill which has been progressive and now become severe. She denies any fevers. She has had poor urine output recently not making much urine at all. She has increased thirst. She has no chest pain she denies palpitations. She says she has serious chronic low  back pain and this has not worsened but the weakness is severe. Also for past few days she has been having intermittent myoclonic jerking which lasted about 30 seconds and resolves. She has no other shaking. He has made her symptoms better nothing has made them worse. Symptoms have been chronic and worsening since they developed.  Assessment & [name]   Elevated troponin -Likely demand ischemia -Troponin 0.32, 0.46, 0.41 (relatively flat) -Denies chest pain -Cardiology consulted appreciated -Echocardiogram pending -Patient had heart cath 07/28/2016 which showed no apparent coronary artery disease, continue aggressive medical therapy and both pressure control -Patient currently on aspirin and Plavix  Acute systolic CHF exacerbation -BNP on admission 1152 -CXR showed mild pulmonary vascular congestion -Last echocardiogram 05/26/2016 showed an EF of 20-25%, restrictive physiology, decreased left ventricular diastolic compliance and or increased left atrial pressure -Pending repeat echocardiogram -Continue IV Lasix 40 mg BID -Continue spironolactone -Monitor intake and output, daily weights  Back pain with spinal stenosis of the lumbar region -MRI showed L3-L4 canal stenosis of disorganized cauda equina -Neurosurgery consultation appreciated -Continue pain control  Diabetes mellitus, type II, complicated by hyperglycemia -On arrival to emergency department, blood sugars over 600 -Continue insulin playing scale CBG monitoring -Patient uses Prandin and Tradjenta at home, will hold for now  Hypokalemia -Replacing continue to monitor BMP  Chronic kidney disease, stage III -Creatinine currently 1.36, appears to be at baseline -Continue to monitor BMP closely as patient will be diuresing  Polysubstance abuse -UDS positive for cocaine  Tobacco abuse -Cessation discussed  Hyperlipidemia -Continue  statin, fenofibrate  GERD -Continue PPI  COPD -Stable, continue home  medications  Anxiety/depression -Continue Prozac  DVT Prophylaxis  lovenox  Code Status: Full  Family Communication: None at bedside  Disposition [name]: admitted. Dispo TBD  Consultants Neurosurgery Cardiology  Procedures  None  Antibiotics   Anti-infectives    None      Subjective:   [name] seen and examined today.  Continues to complain of back pain. Stated that she was on the toilet yesterday for a few hours crying to coming to the hospital. Stated her legs felt like noodles and she was unable to stand or move. Denies chest pain, shortness breath, abdominal pain, nausea vomiting, diarrhea or constipation, dizziness or headache.  Objective:   Vitals:   06/22/17 0830 06/22/17 0845 06/22/17 0857 06/22/17 0955  BP: (!) 123/106   (!) 139/112  Pulse:  (!) 48  72  Resp:    17  Temp:    98.3 F (36.8 C)  TempSrc:    Axillary  SpO2:  96% 93% 95%  Weight:      Height:        Intake/Output Summary (Last 24 hours) at 06/22/17 1501 Last data filed at 06/22/17 0855  Gross per 24 hour  Intake             2250 ml  Output              450 ml  Net             1800 ml   Filed Weights   06/21/17 1554  Weight: 83 kg (183 lb)    Exam  General: Well developed, well nourished, NAD, appears stated age  HEENT: NCAT, mucous membranes moist.   Cardiovascular: S1 S2 auscultated, no rubs, murmurs or gallops. Regular rate and rhythm.  Respiratory: Clear to auscultation bilaterally with equal chest rise  Abdomen: Soft, nontender, nondistended, + bowel sounds  Extremities: warm dry without cyanosis clubbing or edema  Neuro: AAOx3, cranial nerves grossly intact. Decreased strength in LE B/L. Sensation intact  Skin: Without rashes exudates or nodules  Psych: Normal affect and demeanor with intact judgement and insight   Data Reviewed: I have personally reviewed following labs and imaging studies  CBC:  Recent Labs Lab 06/21/17 1637 06/22/17 0337  WBC 7.4  8.1  HGB 14.1 13.6  HCT 41.4 41.5  MCV 95.0 96.7  PLT 194 258   Basic Metabolic Panel:  Recent Labs Lab 06/21/17 1637 06/22/17 0337  NA 134* 140  K 2.9* 3.2*  CL 96* 107  CO2 27 26  GLUCOSE 629* 107*  BUN 23* 20  CREATININE 1.47* 1.36*  CALCIUM 9.1 8.2*  MG  --  1.9   GFR: Estimated Creatinine Clearance: 46.7 mL/min (A) (by C-G formula based on SCr of 1.36 mg/dL (H)). Liver Function Tests:  Recent Labs Lab 06/21/17 1637  AST 13*  ALT 31  ALKPHOS 178*  BILITOT 1.2  PROT 5.7*  ALBUMIN 3.1*   No results for input(s): LIPASE, AMYLASE in the last 168 hours. No results for input(s): AMMONIA in the last 168 hours. Coagulation Profile: No results for input(s): INR, PROTIME in the last 168 hours. Cardiac Enzymes:  Recent Labs Lab 06/21/17 1637 06/21/17 2129 06/22/17 0337  CKTOTAL 63  --   --   TROPONINI 0.32* 0.46* 0.41*   BNP (last 3 results) No results for input(s): PROBNP in the last 8760 hours. HbA1C: No results for input(s): HGBA1C in the last 72  hours. CBG:  Recent Labs Lab 06/22/17 0454 06/22/17 0615 06/22/17 0722 06/22/17 0829 06/22/17 1129  GLUCAP 67 79 127* 131* 155*   Lipid Profile: No results for input(s): CHOL, HDL, LDLCALC, TRIG, CHOLHDL, LDLDIRECT in the last 72 hours. Thyroid Function Tests: No results for input(s): TSH, T4TOTAL, FREET4, T3FREE, THYROIDAB in the last 72 hours. Anemia Panel: No results for input(s): VITAMINB12, FOLATE, FERRITIN, TIBC, IRON, RETICCTPCT in the last 72 hours. Urine analysis:    Component Value Date/Time   COLORURINE YELLOW 06/21/2017 1601   APPEARANCEUR HAZY (A) 06/21/2017 1601   LABSPEC 1.022 06/21/2017 1601   PHURINE 6.0 06/21/2017 1601   GLUCOSEU >=500 (A) 06/21/2017 1601   HGBUR SMALL (A) 06/21/2017 1601   BILIRUBINUR NEGATIVE 06/21/2017 1601   KETONESUR NEGATIVE 06/21/2017 1601   PROTEINUR >=300 (A) 06/21/2017 1601   UROBILINOGEN 0.2 09/11/2014 1319   NITRITE NEGATIVE 06/21/2017 1601    LEUKOCYTESUR MODERATE (A) 06/21/2017 1601   Sepsis Labs: @LABRCNTIP (procalcitonin:4,lacticidven:4)  )No results found for this or any previous visit (from the past 240 hour(s)).    Radiology Studies: Dg Lumbar Spine Complete  Result Date: 06/21/2017 CLINICAL DATA:  Acute low back pain for 3 days. EXAM: LUMBAR SPINE - COMPLETE 4+ VIEW COMPARISON:  05/09/2015 and prior studies FINDINGS: No acute fracture noted. Increased severe disc space loss at L3-4 noted with 6 mm anterolisthesis of L3 on L4, new since 05/09/2015. Moderate to severe degenerative disc disease and spondylosis at L4-5 and L5-S1 again identified. Mild multilevel degenerative disc disease otherwise noted. No suspicious focal bony lesion or spondylolysis identified. IMPRESSION: 1. Increased severe degenerative disc disease at L3-4 with new grade 1 anterolisthesis at this level. 2. Moderate to severe degenerative changes at L4-5 and L5-S1 again identified. Electronically Signed   By: Margarette Canada M.D.   On: 06/21/2017 17:22   Ct Head Wo Contrast  Result Date: 06/22/2017 CLINICAL DATA:  Left lower extremity weakness beginning yesterday. EXAM: CT HEAD WITHOUT CONTRAST TECHNIQUE: Contiguous axial images were obtained from the base of the skull through the vertex without intravenous contrast. COMPARISON:  04/03/2015 FINDINGS: Brain: Premature chronic small vessel ischemia with remote lacunar infarcts in the bilateral thalamus and left caudate head. Small remote bilateral cerebellar infarcts. Ischemic gliosis in the cerebral white matter. No evidence of acute infarct, hemorrhage, hydrocephalus, or masslike findings. Vascular: Vertebrobasilar tortuosity. Atherosclerotic calcification. No hyperdense vessel. Skull: No acute or aggressive finding Sinuses/Orbits: Remote blowout fracture of the medial wall left orbit. IMPRESSION: 1. No acute finding. 2. Premature chronic small vessel ischemia with remote lacunar infarcts, as described. Electronically  Signed   By: Monte Fantasia M.D.   On: 06/22/2017 07:22   Mr Lumbar Spine Wo Contrast  Result Date: 06/21/2017 CLINICAL DATA:  Weakness for 1 week. History of diabetes. Assess for cauda equina syndrome. EXAM: MRI LUMBAR SPINE WITHOUT CONTRAST TECHNIQUE: Multiplanar, multisequence MR imaging of the lumbar spine was performed. No intravenous contrast was administered. COMPARISON:  Lumbar spine radiographs June 21, 2017 at 1651 hours and MRI of lumbar spine May 09, 2015 FINDINGS: SEGMENTATION: For the purposes of this report, the last well-formed intervertebral disc will be reported as L5-S1. ALIGNMENT: Maintained lumbar lordosis. Grade 1 L3-4 anterolisthesis. Minimal grade 1 L4-5 anterolisthesis. Minimal grade 1 L5-S1 retrolisthesis. No spondylolysis. VERTEBRAE:Vertebral bodies are intact. Severe L3-4 and L5-S1 disc height loss progressed from prior examination. Moderate L4-5 disc height loss with disc desiccation all levels compatible with degenerative discs. Focal edema L3-4 disc. Moderate to severe subacute or chronic  discogenic endplate changes R4-Y7. Mild L3-4 and L4-5 acute on chronic discogenic endplate changes with trace acute component L3-4. Scalloping posterior L3 vertebral body associated with low T1 and bright T2 signal within Batson's plexus. Minimal bone marrow edema L3-4 facet partially volumes with effusion. CONUS MEDULLARIS: Conus medullaris terminates at L1-2 and demonstrates normal morphology and signal characteristics. Central displacement of the cauda equina with disorganized appearance. PARASPINAL AND SOFT TISSUES: Included prevertebral and paraspinal soft tissues are nonacute. Multiple small low signal masses within the uterus compatible with leiomyoma. Trace L3-4 interspinous fluid signal. DISC LEVELS: T11-12: Small broad-based disc bulge, mild facet arthropathy and ligamentum flavum redundancy. Mild canal stenosis without neural foraminal narrowing. T12-L1, L1-2: No disc bulge, canal  stenosis nor neural foraminal narrowing. L2-3: Small broad-based disc bulge. Minimal facet arthropathy without canal stenosis. No neural foraminal narrowing. L3-4: Anterolisthesis. Large broad-based disc bulge with superimposed LEFT central 5 x 6 mm disc extrusion, 12 mm superior migration. Moderate facet arthropathy and ligamentum flavum redundancy with trace facet effusions. Severe canal stenosis. Severe bilateral neural foraminal narrowing and, encroaching upon the exited L 3 nerve. Lateral recess narrowing may affect the traversing L4 nerves. L4-5: Anterolisthesis. Moderate broad-based disc bulge. Moderate to severe facet arthropathy and ligamentum flavum redundancy with trace facet effusions which are likely reactive. Mild canal stenosis including partially effaced LEFT and RIGHT lateral recesses which may affect the traversing L5 nerves. Moderate to severe RIGHT, severe LEFT neural foraminal narrowing. Encroachment upon at LEFT greater than RIGHT exited L4 nerve. L5-S1: Small to moderate broad-based disc bulge asymmetric to the RIGHT. Mild facet arthropathy. No canal stenosis. Severe bilateral neural foraminal narrowing. IMPRESSION: 1. Grade 1 L3-4 and L4-5 anterolisthesis. Grade 1 L5-S1 retrolisthesis. No spondylolysis. 2. Progressed degenerative change of lumbar spine. Superimposed 5 x 6 x 12 mm L3-4 disc extrusion. 3. Severe canal stenosis L3-4 with resultant disorganized cauda equina. Mild canal stenosis L4-5. 4. Severe neural foraminal narrowing L3-4 through L5-S1. 5. L3-4 inflammatory facet arthropathy. 6. Progressive scalloping of L3 posterior vertebral body suggesting chronic venous changes. Findings would be better characterized on contrast-enhanced images. Electronically Signed   By: Elon Alas M.D.   On: 06/21/2017 18:26   Dg Chest Port 1 View  Result Date: 06/21/2017 CLINICAL DATA:  Weakness for 1 week. EXAM: PORTABLE CHEST 1 VIEW COMPARISON:  05/25/2016. FINDINGS: Stable enlarged  cardiac silhouette. Mildly prominent pulmonary vasculature with improvement. The interstitial markings are mildly prominent. No pleural fluid seen. Mild bilateral shoulder degenerative changes. IMPRESSION: 1. Mild pulmonary vascular congestion with improvement. 2. Stable cardiomegaly. 3. Mild chronic interstitial lung disease. Electronically Signed   By: Claudie Revering M.D.   On: 06/21/2017 16:52     Scheduled Meds: . aspirin EC  81 mg Oral Daily  . clopidogrel  75 mg Oral Q breakfast  . cycloSPORINE  1 drop Both Eyes BID  . enoxaparin (LOVENOX) injection  40 mg Subcutaneous Q24H  . ezetimibe  10 mg Oral Daily  . fenofibrate  160 mg Oral Daily  . FLUoxetine  20 mg Oral Daily  . folic acid  1 mg Oral Daily  . furosemide  40 mg Intravenous BID  . gabapentin  100 mg Oral TID  . hydrOXYzine  50 mg Oral QHS  . insulin aspart  0-5 Units Subcutaneous QHS  . insulin aspart  0-9 Units Subcutaneous TID WC  . insulin aspart  3 Units Subcutaneous TID WC  . linagliptin  5 mg Oral Daily  . mometasone-formoterol  2 puff  Inhalation BID  . montelukast  10 mg Oral QHS  . multivitamin with minerals  1 tablet Oral Daily  . pantoprazole  40 mg Oral Daily  . potassium chloride  30 mEq Oral BID  . repaglinide  1 mg Oral TID AC  . rosuvastatin  40 mg Oral Daily  . sodium chloride flush  3 mL Intravenous Q12H  . spironolactone  25 mg Oral BID  . thiamine  100 mg Oral Daily  . tiotropium  18 mcg Inhalation Daily  . traMADol  50 mg Oral BID  . traZODone  50 mg Oral QHS  . [START ON 06/24/2017] Vitamin D (Ergocalciferol)  50,000 Units Oral Weekly  . white petrolatum       Continuous Infusions: . insulin (NOVOLIN-R) infusion Stopped (06/22/17 0457)     LOS: 1 day   Time Spent in minutes   30 minutes  Daquan Crapps D.O. on 06/22/2017 at 3:01 PM  Between 7am to 7pm - Pager - [phone]  After 7pm go to [email or web address] - password [email or web address]  And look for the night coverage person covering for me after  hours  [facility] Office  [phone]

## 2017-06-22 NOTE — ED Notes (Signed)
Per dr Jerilee Hoh, will order insulin, stop D5 .45 ns and keep insulin drip off, NPO due to possible surgery, repeated and verified.

## 2017-06-22 NOTE — Consult Note (Signed)
Cardiology Consult    Patient ID: [name]; [id number]; December 29, 1965   Admit date: 06/21/2017 Date of Consult: 06/22/2017  Primary Care Provider: [name], MD Primary Cardiologist: Dr. Bronson Ing  Patient Profile    [name] is a 51 y.o. female with past medical history of hypertensive cardiomyopathy (EF 20-25% by echo in 05/2016), HTN, HLD, Type 2 DM, COPD, Stage 3 CKD, and normal cors by cath in 07/2016 who is being seen today for the evaluation of preoperative cardiac clearance at the request of Dr. Ree Kida.   History of Present Illness    [name] presented to [facility] ED on 06/21/2017 for weakness and numbness along her lower extremities. She reports using the restroom a few days ago and her legs were so weak that she could not stand and she had to slide to the ground. Reports being on the floor for at least 12 hours prior to regaining some strength and being able to get her phone and call family.   She has experienced  back pain for years. Her pain was so severe last week that a "friend" gave her some Cocaine to help relieve the symptoms. She reports this did help somewhat. She also consumes 2 pineapple-rum drinks per day and says this helps with her pain as well.   She is prescribed Amlodipine 41m daily, Benazepril 451mdaily, Bisoprolol 26m12maily, Clonidine 0.2 mg once daily, Plavix 726m49mily, Zetia, Lasix 20mg50mly, Crestor 40mg 226my, and Spironolactone 226mg B59mn the outpatient setting but reports not taking any of the medications due to them making her feel fatigued.  She denies any recent chest pain at rest or with exertion. Has baseline dyspnea on exertion and does use her inhalers sporadically. She was previously working with [employer] the company that was arranging this went out of business. She denies any recent orthopnea or PND. Has chronic lower extremity edema.    Initial labs showed WBC of 7.4, Hgb 14.1, platelets 194, K+  2.9, glucose 629, and creatinine 1.47. UDS positive for cocaine. BNP at 1152. Initial troponin 0.32 with repeat values of 0.46 and 0.41. EKG shows sinus tachycardia, HR 105, with PAC's. CXR with mild pulmonary vascular congestion with stable cardiomegaly. Lumbar MRI showed Grade 1 L3-4 and L4-5 anterolisthesis, Grade 1 L5-S1 retrolisthesis with progressive degenerative change of lumbar spine and superimposed 5 x 6 x 12 mm L3-4 disc extrusion with resultant disorganized cauda equina.   Neurosurgery has been consulted and recommend a lumbar disectomy and decompression. According to the patient, she is going to the OR later today.    Past Medical History   Past Medical History:  Diagnosis Date  . Alcohol use   . Ankle fracture, lateral malleolus, closed 12/30/2011  . Anxiety   . Breast mass, left 12/15/2011  . CKD (chronic kidney disease)   . COPD (chronic obstructive pulmonary disease) (HCC)   [place] type [place] ([place]   [place] Charcot foot (HCC) 3/[place]013  . Essential hypertension   . History of GI bleed 12/05/2009   Qualifier: Diagnosis of  By: ShermanZeb Comfortyperlipidemia   . Hypertensive cardiomyopathy (HCC) 01Heath/2014   a. 05/2016: echo showing EF of 20-25% with normal cors by cath in 07/2016.  . NoncoMarland Kitchenpliance with medication regimen   . Obesity   . Panic attacks   . Tobacco abuse      Allergies:   No Known Allergies  Home Medications:  Home Medications:  Prior to Admission medications   Medication Sig Start Date End Date Taking? Authorizing Provider  amLODipine (NORVASC) 10 MG tablet TAKE 1 TABLET BY MOUTH ONCE A DAY. 02/01/17  Yes [name], MD  benazepril (LOTENSIN) 40 MG tablet TAKE 1 TABLET BY MOUTH ONCE A DAY. 02/01/17  Yes [name], MD  bisoprolol (ZEBETA) 5 MG tablet Take 1 tablet (5 mg total) by mouth daily. 04/26/17  Yes [name], MD  Choline Fenofibrate 135 MG capsule TAKE 1 CAPSULE BY MOUTH EVERY DAY. 06/01/17  Yes  [name], MD  cloNIDine (CATAPRES) 0.2 MG tablet TAKE (1) TABLET BY MOUTH AT BEDTIME FOR BLOOD PRESSURE. 11/30/16  Yes [name], MD  clopidogrel (PLAVIX) 75 MG tablet TAKE 1 TABLET BY MOUTH DAILY WITH BREAKFAST. 06/01/17  Yes [name], MD  ezetimibe (ZETIA) 10 MG tablet Take 1 tablet (10 mg total) by mouth daily. 04/26/17  Yes [name], MD  FLUoxetine (PROZAC) 20 MG capsule TAKE 1 CAPSULE BY MOUTH EVERY DAY. 06/01/17  Yes [name], MD  furosemide (LASIX) 20 MG tablet TAKE 1 TABLET BY MOUTH DAILY. 05/31/17  Yes Herminio Commons, MD  gabapentin (NEURONTIN) 100 MG capsule TAKE 1 CAPSULE BY MOUTH THREE TIMES A DAY. 06/01/17  Yes [name], MD  hydrOXYzine (ATARAX/VISTARIL) 50 MG tablet TAKE (1) TABLET BY MOUTH AT BEDTIME. 06/01/17  Yes [name], MD  montelukast (SINGULAIR) 10 MG tablet TAKE (1) TABLET BY MOUTH AT BEDTIME. 06/01/17  Yes [name], MD  pantoprazole (PROTONIX) 40 MG tablet Take 1 tablet (40 mg total) by mouth daily. 04/26/17  Yes [name], MD  potassium chloride SA (K-DUR,KLOR-CON) 20 MEQ tablet TAKE 1&1/2 TABLET BY MOUTH DAILY. 06/01/17  Yes [name], MD  PROAIR HFA 108 [phone] Base) MCG/ACT inhaler INHALE 2 PUFFS EVERY 6 HOURS AS NEEDED FOR SHORTNESS OF BREATH/WHEEZING. 06/01/17  Yes [name], MD  repaglinide (PRANDIN) 1 MG tablet TAKE 1 TABLET BY MOUTH 3 TIMES DAILY BEFORE MEALS. 11/30/16  Yes [name], MD  rosuvastatin (CRESTOR) 40 MG tablet TAKE 1 TABLET BY MOUTH ONCE A DAY. 06/01/17  Yes [name], MD  SPIRIVA HANDIHALER 18 MCG inhalation capsule Place 18 mcg into inhaler and inhale daily.  05/13/16  Yes [provider]  spironolactone (ALDACTONE) 25 MG tablet TAKE 1 TABLET BY MOUTH TWICE A DAY. 05/31/17  Yes Herminio Commons, MD  TRADJENTA 5 MG TABS tablet TAKE 1 TABLET BY MOUTH ONCE A DAY. 06/01/17  Yes [name], MD  traZODone (DESYREL) 50 MG tablet  TAKE (1) TABLET BY MOUTH AT BEDTIME. 06/01/17  Yes [name], MD  Vitamin D, Ergocalciferol, (DRISDOL) 50000 units CAPS capsule Take 1 capsule (50,000 Units total) by mouth once a week. 03/02/17  Yes [name], MD  azelastine (ASTELIN) 0.1 % nasal spray Place 2 sprays into both nostrils 2 (two) times daily. Use in each nostril as directed 04/02/17   [name], MD  fluticasone [facility]) 50 MCG/ACT nasal spray Place 2 sprays into both nostrils daily. 02/08/17   [name], MD  ipratropium-albuterol (DUONEB) 0.5-2.5 (3) MG/3ML SOLN Take 3 mLs by nebulization every 6 (six) hours as needed. Patient taking differently: Take 3 mLs by nebulization every 6 (six) hours as needed (for wheezing/shortness of breath).  03/10/16   [name], MD    Inpatient Medications    Scheduled  Meds: . aspirin EC  81 mg Oral Daily  . bisoprolol  5 mg Oral Daily  . clopidogrel  75 mg Oral Q breakfast  . cycloSPORINE  1 drop Both Eyes BID  . enoxaparin (LOVENOX) injection  40 mg Subcutaneous Q24H  . ezetimibe  10 mg Oral Daily  . fenofibrate  160 mg Oral Daily  . FLUoxetine  20 mg Oral Daily  . folic acid  1 mg Oral Daily  . furosemide  40 mg Intravenous BID  . gabapentin  100 mg Oral TID  . hydrOXYzine  50 mg Oral QHS  . insulin aspart  0-5 Units Subcutaneous QHS  . insulin aspart  0-9 Units Subcutaneous TID WC  . insulin aspart  3 Units Subcutaneous TID WC  . mometasone-formoterol  2 puff Inhalation BID  . montelukast  10 mg Oral QHS  . multivitamin with minerals  1 tablet Oral Daily  . pantoprazole  40 mg Oral Daily  . potassium chloride  30 mEq Oral BID  . rosuvastatin  40 mg Oral Daily  . sodium chloride flush  3 mL Intravenous Q12H  . spironolactone  25 mg Oral BID  . thiamine  100 mg Oral Daily  . tiotropium  18 mcg Inhalation Daily  . traMADol  50 mg Oral BID  . traZODone  50 mg Oral QHS  . [START ON 06/24/2017] Vitamin D (Ergocalciferol)  50,000 Units Oral  Weekly  . white petrolatum       Continuous Infusions: . insulin (NOVOLIN-R) infusion Stopped (06/22/17 0457)   PRN Meds: acetaminophen **OR** acetaminophen, bisacodyl, ipratropium-albuterol, magnesium citrate, ondansetron **OR** ondansetron (ZOFRAN) IV, oxyCODONE, polyethylene glycol, tizanidine  Family History    Family History  Problem Relation Age of Onset  . Hypertension Mother   . Heart attack Mother   . Hypertension Father        CABG   . Hypertension Sister   . Hypertension Brother   . Hypertension Sister   . Cancer Sister        breast   . Arthritis Unknown   . Cancer Unknown   . Diabetes Unknown   . Asthma Unknown   . Hypertension Daughter   . Hypertension Son     Social History    Social History   Social History  . Marital status: Widowed    Spouse name: N/A  . Number of children: 2  . Years of education: N/A   Occupational History  . Not on file.   Social History Main Topics  . Smoking status: Current Every Day Smoker    Packs/day: 1.00    Years: 20.00    Types: Cigarettes    Last attempt to quit: 06/03/2016  . Smokeless tobacco: Never Used  . Alcohol use No     Comment: Quit in 2017  . Drug use: Yes    Frequency: 3.0 times per week    Types: Marijuana, Cocaine  . Sexual activity: Not Currently    Birth control/ protection: None   Other Topics Concern  . Not on file   Social History Narrative   Currently living in a motel. Stopped taking all her medicines because she was "fed up"     Review of Systems    General:  No chills, fever, night sweats or weight changes.  Cardiovascular:  No chest pain,orthopnea, palpitations, paroxysmal nocturnal dyspnea. Positive for dyspnea on exertion and edema.  Dermatological: No rash, lesions/masses Respiratory: No cough, dyspnea Urologic: No hematuria, dysuria Abdominal:  No nausea, vomiting, diarrhea, bright red blood per rectum, melena, or hematemesis Neurologic:  No visual changes, changes in  mental status. Positive for weakness and lower extremity numbness.   All other systems reviewed and are otherwise negative except as noted above.  Physical Exam/Data    Blood pressure (!) 139/112, pulse 72, temperature 98.3 F (36.8 C), temperature source Axillary, resp. rate 17, height _0  (1.549 m), weight 183 lb (83 kg), last menstrual period 05/19/2016, SpO2 95 %.  General: Pleasant African American female appearing in NAD Psych: Normal affect. Tangential thought process.  Neuro: Alert and oriented X 3. Moves all extremities spontaneously. HEENT: Normal  Neck: Supple without bruits or JVD. Lungs:  Resp regular and unlabored, CTA without wheezing or rales. Heart: RRR with occasional ectopic beats. No s3, s4, or murmurs. Abdomen: Soft, non-tender, non-distended, BS + x 4.  Extremities: No clubbing, cyanosis or edema. DP/PT/Radials 2+ and equal bilaterally.   EKG:  The EKG was personally reviewed and demonstrates: Sinus tachycardia, HR 105, with PAC's   Labs/Studies     Relevant CV Studies:  Cardiac Catheterization: 54/27/0623  LV end diastolic pressure is mildly elevated.  There is no aortic valve stenosis.  No angiographically apparent coronary artery disease.  Tortuous right subclavian requiring Glide wire. Would not use right radial approach if cath was needed in the future.   Continue aggressive medical therapy and blood pressure control.    Echocardiogram: 05/26/2016 Study Conclusions  - Left ventricle: The cavity size was mildly dilated. Wall   thickness was increased in a pattern of moderate LVH. Systolic   function was severely reduced. The estimated ejection fraction   was in the range of 20% to 25%. Diffuse hypokinesis. Doppler   parameters are consistent with restrictive physiology, indicative   of decreased left ventricular diastolic compliance and/or   increased left atrial pressure. - Aortic valve: There was trivial regurgitation. - Mitral valve:  Calcified annulus. There was mild to moderate   regurgitation. - Left atrium: The atrium was moderately dilated. - Right ventricle: Systolic function was mildly to moderately   reduced. - Right atrium: Central venous pressure (est): 8 mm Hg. - Tricuspid valve: There was trivial regurgitation. - Pulmonary arteries: PA peak pressure: 35 mm Hg (S). - Pericardium, extracardiac: There was no pericardial effusion.  Impressions:  - Mild LV chamber dilatation with moderate LVH and LVEF 20-25%.   There is diffuse hypokinesis noted with restrictive diastolic   filling pattern. Moderate left atrial enlargement. Mild MAC with   mild to moderate mitral regurgitation. Trivial aortic   regurgitation. Mild to moderate reduction in RV contraction.   Trivial tricuspid regurgitation with PASP 35 mmHg.  Laboratory Data:  Chemistry  Recent Labs Lab 06/21/17 1637 06/22/17 0337  NA 134* 140  K 2.9* 3.2*  CL 96* 107  CO2 27 26  GLUCOSE 629* 107*  BUN 23* 20  CREATININE 1.47* 1.36*  CALCIUM 9.1 8.2*  GFRNONAA 40* 44*  GFRAA 46* 50*  ANIONGAP 11 7     Recent Labs Lab 06/21/17 1637  PROT 5.7*  ALBUMIN 3.1*  AST 13*  ALT 31  ALKPHOS 178*  BILITOT 1.2   Hematology  Recent Labs Lab 06/21/17 1637 06/22/17 0337  WBC 7.4 8.1  RBC 4.36 4.29  HGB 14.1 13.6  HCT 41.4 41.5  MCV 95.0 96.7  MCH 32.3 31.7  MCHC 34.1 32.8  RDW 13.8 14.2  PLT 194 194   Cardiac Enzymes  Recent Labs Lab 06/21/17 1637  06/21/17 2129 06/22/17 0337  TROPONINI 0.32* 0.46* 0.41*   No results for input(s): TROPIPOC in the last 168 hours.  BNP  Recent Labs Lab 06/21/17 1637 06/22/17 0337  BNP 1,152.0* 835.0*    DDimer No results for input(s): DDIMER in the last 168 hours.  Radiology/Studies:  Dg Lumbar Spine Complete  Result Date: 06/21/2017 CLINICAL DATA:  Acute low back pain for 3 days. EXAM: LUMBAR SPINE - COMPLETE 4+ VIEW COMPARISON:  05/09/2015 and prior studies FINDINGS: No acute fracture  noted. Increased severe disc space loss at L3-4 noted with 6 mm anterolisthesis of L3 on L4, new since 05/09/2015. Moderate to severe degenerative disc disease and spondylosis at L4-5 and L5-S1 again identified. Mild multilevel degenerative disc disease otherwise noted. No suspicious focal bony lesion or spondylolysis identified. IMPRESSION: 1. Increased severe degenerative disc disease at L3-4 with new grade 1 anterolisthesis at this level. 2. Moderate to severe degenerative changes at L4-5 and L5-S1 again identified. Electronically Signed   By: Margarette Canada M.D.   On: 06/21/2017 17:22   Ct Head Wo Contrast  Result Date: 06/22/2017 CLINICAL DATA:  Left lower extremity weakness beginning yesterday. EXAM: CT HEAD WITHOUT CONTRAST TECHNIQUE: Contiguous axial images were obtained from the base of the skull through the vertex without intravenous contrast. COMPARISON:  04/03/2015 FINDINGS: Brain: Premature chronic small vessel ischemia with remote lacunar infarcts in the bilateral thalamus and left caudate head. Small remote bilateral cerebellar infarcts. Ischemic gliosis in the cerebral white matter. No evidence of acute infarct, hemorrhage, hydrocephalus, or masslike findings. Vascular: Vertebrobasilar tortuosity. Atherosclerotic calcification. No hyperdense vessel. Skull: No acute or aggressive finding Sinuses/Orbits: Remote blowout fracture of the medial wall left orbit. IMPRESSION: 1. No acute finding. 2. Premature chronic small vessel ischemia with remote lacunar infarcts, as described. Electronically Signed   By: Monte Fantasia M.D.   On: 06/22/2017 07:22   Mr Lumbar Spine Wo Contrast  Result Date: 06/21/2017 CLINICAL DATA:  Weakness for 1 week. History of diabetes. Assess for cauda equina syndrome. EXAM: MRI LUMBAR SPINE WITHOUT CONTRAST TECHNIQUE: Multiplanar, multisequence MR imaging of the lumbar spine was performed. No intravenous contrast was administered. COMPARISON:  Lumbar spine radiographs June 21, 2017 at 1651 hours and MRI of lumbar spine May 09, 2015 FINDINGS: SEGMENTATION: For the purposes of this report, the last well-formed intervertebral disc will be reported as L5-S1. ALIGNMENT: Maintained lumbar lordosis. Grade 1 L3-4 anterolisthesis. Minimal grade 1 L4-5 anterolisthesis. Minimal grade 1 L5-S1 retrolisthesis. No spondylolysis. VERTEBRAE:Vertebral bodies are intact. Severe L3-4 and L5-S1 disc height loss progressed from prior examination. Moderate L4-5 disc height loss with disc desiccation all levels compatible with degenerative discs. Focal edema L3-4 disc. Moderate to severe subacute or chronic discogenic endplate changes H8-N2. Mild L3-4 and L4-5 acute on chronic discogenic endplate changes with trace acute component L3-4. Scalloping posterior L3 vertebral body associated with low T1 and bright T2 signal within Batson's plexus. Minimal bone marrow edema L3-4 facet partially volumes with effusion. CONUS MEDULLARIS: Conus medullaris terminates at L1-2 and demonstrates normal morphology and signal characteristics. Central displacement of the cauda equina with disorganized appearance. PARASPINAL AND SOFT TISSUES: Included prevertebral and paraspinal soft tissues are nonacute. Multiple small low signal masses within the uterus compatible with leiomyoma. Trace L3-4 interspinous fluid signal. DISC LEVELS: T11-12: Small broad-based disc bulge, mild facet arthropathy and ligamentum flavum redundancy. Mild canal stenosis without neural foraminal narrowing. T12-L1, L1-2: No disc bulge, canal stenosis nor neural foraminal narrowing. L2-3: Small broad-based disc bulge. Minimal  facet arthropathy without canal stenosis. No neural foraminal narrowing. L3-4: Anterolisthesis. Large broad-based disc bulge with superimposed LEFT central 5 x 6 mm disc extrusion, 12 mm superior migration. Moderate facet arthropathy and ligamentum flavum redundancy with trace facet effusions. Severe canal stenosis. Severe bilateral  neural foraminal narrowing and, encroaching upon the exited L 3 nerve. Lateral recess narrowing may affect the traversing L4 nerves. L4-5: Anterolisthesis. Moderate broad-based disc bulge. Moderate to severe facet arthropathy and ligamentum flavum redundancy with trace facet effusions which are likely reactive. Mild canal stenosis including partially effaced LEFT and RIGHT lateral recesses which may affect the traversing L5 nerves. Moderate to severe RIGHT, severe LEFT neural foraminal narrowing. Encroachment upon at LEFT greater than RIGHT exited L4 nerve. L5-S1: Small to moderate broad-based disc bulge asymmetric to the RIGHT. Mild facet arthropathy. No canal stenosis. Severe bilateral neural foraminal narrowing. IMPRESSION: 1. Grade 1 L3-4 and L4-5 anterolisthesis. Grade 1 L5-S1 retrolisthesis. No spondylolysis. 2. Progressed degenerative change of lumbar spine. Superimposed 5 x 6 x 12 mm L3-4 disc extrusion. 3. Severe canal stenosis L3-4 with resultant disorganized cauda equina. Mild canal stenosis L4-5. 4. Severe neural foraminal narrowing L3-4 through L5-S1. 5. L3-4 inflammatory facet arthropathy. 6. Progressive scalloping of L3 posterior vertebral body suggesting chronic venous changes. Findings would be better characterized on contrast-enhanced images. Electronically Signed   By: Elon Alas M.D.   On: 06/21/2017 18:26   Dg Chest Port 1 View  Result Date: 06/21/2017 CLINICAL DATA:  Weakness for 1 week. EXAM: PORTABLE CHEST 1 VIEW COMPARISON:  05/25/2016. FINDINGS: Stable enlarged cardiac silhouette. Mildly prominent pulmonary vasculature with improvement. The interstitial markings are mildly prominent. No pleural fluid seen. Mild bilateral shoulder degenerative changes. IMPRESSION: 1. Mild pulmonary vascular congestion with improvement. 2. Stable cardiomegaly. 3. Mild chronic interstitial lung disease. Electronically Signed   By: Claudie Revering M.D.   On: 06/21/2017 16:52     Assessment & Plan     1. Preoperative Cardiac Clearance for Lumbar disectomy and decompression - presented with weakness and numbness of her lower extremities. Lumbar MRI showed Grade 1 L3-4 and L4-5 anterolisthesis, Grade 1 L5-S1 retrolisthesis with progressive degenerative change of lumbar spine and superimposed 5 x 6 x 12 mm L3-4 disc extrusion with resultant disorganized cauda equina.  - Neurosurgery has been consulted and recommends a lumbar disectomy and decompression. - from a cardiac perspective, she had normal cors by cath last year and she denies any recent chest pain. Has baseline dyspnea on exertion which has been unchanged.  - would not pursue further ischemic evaluation prior to the planned procedure with her recent cath. She is of acceptable cardiac risk to proceed. Follow fluid status in the post-operative setting.   2. Hypertensive cardiomyopathy - EF 20-25% by echo in 05/2016 with cath in 07/2016 showing normal cors.  - previously on Amlodipine 51m daily, Benazepril 427mdaily, Bisoprolol 31m56maily, Clonidine 0.2 mg once daily, Plavix 731m64mily, Zetia, Lasix 20mg27mly, Crestor 40mg 23my, and Spironolactone 231mg B70mn the outpatient setting but reports not taking any of the medications due to them making her feel fatigued. - BNP was elevated at 1152 on admission and she has been started on IV Lasix. On examination, she does not appear overly volume overloaded. Monitor fluid status closely in the post-operative setting.  - has been restarted on Spironolactone. Will restart Bisoprolol and plan to restart ACE-I or ARB prior to discharge if BP and kidney function allows.   3. Elevated Troponin - initial troponin  0.32 with repeat values of 0.46 and 0.41. Unclear why cardiac enzymes were checked as she did not report any chest pain.  - she has known hypertensive cardiomyopathy as above with catheterization in 07/2016 showing normal cors.  - a repeat echocardiogram is pending to assess LV function and wall  motion. Would not pursue further ischemic evaluation at this time.   4. HTN - BP is variable at 84/62 - 152/126 while admitted. Most recent BP at 139/112.  - has been restarted on Spironolactone 24m BID. Would restart Bisoprolol 554mdaily in the setting of her frequent ectopy (cardioselective BB in the setting of COPD).   5. Stage 3 CKD - creatine at 1.36 this AM. Close to baseline.   6. Atrial tachycardia/ PAC's - HR has been elevated in the 90's to low-100's. Episodes of atrial tachycardia noted on telemetry.  - K+ 3.2 this AM (has been replaced). Mg 1.9. - plan to restart PTA Bisoprolol.   7. Type 2 DM - glucose elevated to 629 upon arrival to the ED. Improved to 131 on most recent check. - Hgb A1c pending. Per admitting team.   8. Medical Noncompliance/ Substance Use - previously self-discontinued her cardiac medications. Compliance with these strongly encouraged. - also uses Cocaine intermittently and consumes 2-3 alcoholic beverages per day. Cessation advised.    Signed, BrErma HeritagePA-C 06/22/2017, 3:19 PM Pager: [phone] above, patient seen and examined. Briefly she is a [age]ear old female with past medical history of hypertensive cardiomyopathy, diabetes mellitus, hypertension, hyperlipidemia, COPD, stage III chronic kidney disease for preoperative evaluation prior to lumbar discectomy and decompression. Patient has not been taking any of her cardiac medications because she states they do not work and makes her feel bad. She did have a cardiac catheterization in October 2017 that showed no coronary disease. Last echocardiogram August 2017 showed ejection fraction 20-25%, moderate left ventricular hypertrophy and mild to moderate mitral regurgitation. Patient also with history of substance abuse. She was admitted after having difficulty ambulating and being found in the floor. She will require back surgery and cardiology asked to evaluate preoperatively. She does  have dyspnea on exertion but no orthopnea or PND. Mild pedal edema. No chest pain or syncope. Electrocardiogram shows sinus rhythm with PACs, left ventricular hypertrophy with lateral T-wave inversion and prolonged QT interval. aboratories show creatinine 1.36, troponin 0.41 and BNP 835. Drug screen positive cocaine.  1 preoperative evaluation prior to back surgery-previous catheterization revealed no coronary disease. She has not had chest pain. She has chronic dyspnea on exertion but does not appear to be decompensated from a heart failure standpoint. We will resume her cardiac medications. She may proceed without further cardiac evaluation. Would follow closely postoperatively for volume excess given history of severely reduced LV function.  2 hypertensive cardiomyopathy-patient has discontinued all of her previous medications. I explained the importance of compliance. Will continue with IV Lasix as she has mild volume excess on examination with pedal edema. Hold spironolactone for now. We will resume beta-blockade and ACE inhibitor prior to discharge. Titrate medications as tolerated.  3 elevated troponin-No chest pain and no clear trend. No plans for further ischemia evaluation and not consistent with acute coronary syndrome.  4 chronic stage III kidney disease-follow renal function closely.  5 hypertension-patient with borderline blood pressure after reinitiating all other outpatient medications. Would continue Lasix and add bisoprolol. Can resume ACE inhibitor later. Follow blood pressure and adjust regimen as needed.   6 paroxysmal atrial tachycardia-Noted on  telemetry. Resume beta blocker and follow.  Kirk Ruths, MD

## 2017-06-22 NOTE — Progress Notes (Signed)
Patient is admitted to [name] from [facility] Patient is AOX3 with no family at the bedside.

## 2017-06-22 NOTE — Progress Notes (Signed)
  Echocardiogram 2D Echocardiogram has been performed.  [name] L Androw 06/22/2017, 3:38 PM

## 2017-06-22 NOTE — ED Notes (Signed)
hospitalist paged again

## 2017-06-22 NOTE — ED Notes (Signed)
Date and time results received: 06/22/17 0425 (use smartphrase ".now" to insert current time)  Test: troponin Critical Value: 0.41  Name of Provider Notified: Dr. Darrick Meigs notified at [place]  Orders Received? Or Actions Taken?: no/na

## 2017-06-22 NOTE — ED Notes (Signed)
Dr Jerilee Hoh called back, advised will look at chart and call me back. Pt sleeping.

## 2017-06-22 NOTE — ED Notes (Signed)
[name] [address], [place]

## 2017-06-23 ENCOUNTER — Encounter (HOSPITAL_COMMUNITY): Payer: Self-pay

## 2017-06-23 ENCOUNTER — Inpatient Hospital Stay (HOSPITAL_COMMUNITY): Payer: Medicare Other

## 2017-06-23 DIAGNOSIS — F99 Mental disorder, not otherwise specified: Secondary | ICD-10-CM

## 2017-06-23 DIAGNOSIS — R0902 Hypoxemia: Secondary | ICD-10-CM

## 2017-06-23 DIAGNOSIS — I959 Hypotension, unspecified: Secondary | ICD-10-CM

## 2017-06-23 LAB — CBC
HCT: 40.9 % (ref 36.0–46.0)
Hemoglobin: 13.2 g/dL (ref 12.0–15.0)
MCH: 31.4 pg (ref 26.0–34.0)
MCHC: 32.3 g/dL (ref 30.0–36.0)
MCV: 97.1 fL (ref 78.0–100.0)
Platelets: 186 10*3/uL (ref 150–400)
RBC: 4.21 MIL/uL (ref 3.87–5.11)
RDW: 14.6 % (ref 11.5–15.5)
WBC: 11.2 10*3/uL — ABNORMAL HIGH (ref 4.0–10.5)

## 2017-06-23 LAB — HEMOGLOBIN A1C
Hgb A1c MFr Bld: >15.5 % — ABNORMAL HIGH (ref 4.8–5.6)
Hgb A1c MFr Bld: >15.5 % — ABNORMAL HIGH (ref 4.8–5.6)
Mean Plasma Glucose: >398 mg/dL
Mean Plasma Glucose: >398 mg/dL

## 2017-06-23 LAB — BLOOD GAS, ARTERIAL
Acid-base deficit: 0.1 mmol/L (ref 0.0–2.0)
Bicarbonate: 23.2 mmol/L (ref 20.0–28.0)
Drawn by: 301361
FIO2: 40
O2 Saturation: 98.8 %
Patient temperature: 98.6
pCO2 arterial: 33 mmHg (ref 32.0–48.0)
pH, Arterial: 7.462 — ABNORMAL HIGH (ref 7.350–7.450)
pO2, Arterial: 136 mmHg — ABNORMAL HIGH (ref 83.0–108.0)

## 2017-06-23 LAB — GLUCOSE, CAPILLARY
Glucose-Capillary: 208 mg/dL — ABNORMAL HIGH (ref 65–99)
Glucose-Capillary: 168 mg/dL — ABNORMAL HIGH (ref 65–99)
Glucose-Capillary: 144 mg/dL — ABNORMAL HIGH (ref 65–99)
Glucose-Capillary: 201 mg/dL — ABNORMAL HIGH (ref 65–99)

## 2017-06-23 LAB — HIV ANTIBODY (ROUTINE TESTING W REFLEX): HIV Screen 4th Generation wRfx: NONREACTIVE

## 2017-06-23 LAB — BASIC METABOLIC PANEL
Anion gap: 10 (ref 5–15)
BUN: 18 mg/dL (ref 6–20)
CO2: 23 mmol/L (ref 22–32)
Calcium: 8.4 mg/dL — ABNORMAL LOW (ref 8.9–10.3)
Chloride: 107 mmol/L (ref 101–111)
Creatinine, Ser: 1.39 mg/dL — ABNORMAL HIGH (ref 0.44–1.00)
GFR calc Af Amer: 49 mL/min — ABNORMAL LOW (ref >60)
GFR calc non Af Amer: 42 mL/min — ABNORMAL LOW (ref >60)
Glucose, Bld: 202 mg/dL — ABNORMAL HIGH (ref 65–99)
Potassium: 4.1 mmol/L (ref 3.5–5.1)
Sodium: 140 mmol/L (ref 135–145)

## 2017-06-23 LAB — CK TOTAL AND CKMB (NOT AT ARMC)
CK, MB: 5.2 ng/mL — ABNORMAL HIGH (ref 0.5–5.0)
Relative Index: INVALID (ref 0.0–2.5)
Total CK: 40 U/L (ref 38–234)

## 2017-06-23 MED ORDER — THIAMINE HCL 100 MG/ML IJ SOLN: 100.0000 mg | Freq: Every day | INTRAMUSCULAR | Status: DC

## 2017-06-23 MED ORDER — VITAMIN B-1 100 MG PO TABS: 100.0000 mg | ORAL_TABLET | Freq: Every day | ORAL | Status: DC

## 2017-06-23 MED ORDER — SODIUM CHLORIDE 0.9 % IV BOLUS (SEPSIS): 250.0000 mL | Freq: Once | INTRAVENOUS | Status: DC

## 2017-06-23 MED ORDER — LORAZEPAM 2 MG/ML IJ SOLN
0.0000 mg | Freq: Four times a day (QID) | INTRAMUSCULAR | Status: DC
Start: 2017-06-23 — End: 2017-06-24

## 2017-06-23 MED ORDER — NALOXONE HCL 0.4 MG/ML IJ SOLN
0.4000 mg | INTRAMUSCULAR | Status: DC | PRN
  Administered 2017-06-24: 17:00:00 via INTRAVENOUS
  Administered 2017-06-25: 0.4 mg via INTRAVENOUS
  Filled 2017-06-23: qty 1

## 2017-06-23 MED ORDER — FOLIC ACID 1 MG PO TABS: 1.0000 mg | ORAL_TABLET | Freq: Every day | ORAL | Status: DC

## 2017-06-23 MED ORDER — NALOXONE HCL 0.4 MG/ML IJ SOLN
INTRAMUSCULAR | Status: AC
  Administered 2017-06-23: 0.4 mg
  Filled 2017-06-23: qty 1

## 2017-06-23 MED ORDER — IPRATROPIUM-ALBUTEROL 0.5-2.5 (3) MG/3ML IN SOLN
3.0000 mL | Freq: Four times a day (QID) | RESPIRATORY_TRACT | Status: DC
  Administered 2017-06-23 – 2017-06-24 (×5): 3 mL via RESPIRATORY_TRACT
  Filled 2017-06-23 (×5): qty 3

## 2017-06-23 MED ORDER — IOPAMIDOL (ISOVUE-370) INJECTION 76%
INTRAVENOUS | Status: AC
  Filled 2017-06-23: qty 100

## 2017-06-23 MED ORDER — THIAMINE HCL 100 MG/ML IJ SOLN
100.0000 mg | INTRAMUSCULAR | Status: DC
  Filled 2017-06-23 (×2): qty 2

## 2017-06-23 MED ORDER — VITAMIN B-1 100 MG PO TABS
100.0000 mg | ORAL_TABLET | ORAL | Status: DC
  Administered 2017-06-24 – 2017-06-26 (×3): 100 mg via ORAL
  Filled 2017-06-23 (×3): qty 1

## 2017-06-23 MED ORDER — IOPAMIDOL (ISOVUE-370) INJECTION 76%: 80.0000 mL | Freq: Once | INTRAVENOUS | Status: DC | PRN

## 2017-06-23 MED ORDER — LORAZEPAM 2 MG/ML IJ SOLN: 1.0000 mg | Freq: Four times a day (QID) | INTRAMUSCULAR | Status: DC | PRN

## 2017-06-23 MED ORDER — LORAZEPAM 2 MG/ML IJ SOLN: 0.0000 mg | Freq: Two times a day (BID) | INTRAMUSCULAR | Status: DC

## 2017-06-23 MED ORDER — LIVING WELL WITH DIABETES BOOK
Freq: Once | Status: DC
  Filled 2017-06-23: qty 1

## 2017-06-23 MED ORDER — LOSARTAN POTASSIUM 50 MG PO TABS
25.0000 mg | ORAL_TABLET | Freq: Every day | ORAL | Status: DC
  Administered 2017-06-23: 25 mg via ORAL
  Filled 2017-06-23: qty 1

## 2017-06-23 MED ORDER — NICOTINE 7 MG/24HR TD PT24
7.0000 mg | MEDICATED_PATCH | Freq: Every day | TRANSDERMAL | Status: DC
  Administered 2017-06-23 – 2017-06-30 (×8): 7 mg via TRANSDERMAL
  Filled 2017-06-23 (×8): qty 1

## 2017-06-23 MED ORDER — ALBUTEROL SULFATE (2.5 MG/3ML) 0.083% IN NEBU: 2.5000 mg | INHALATION_SOLUTION | RESPIRATORY_TRACT | Status: DC | PRN

## 2017-06-23 MED ORDER — LORAZEPAM 1 MG PO TABS: 1.0000 mg | ORAL_TABLET | Freq: Four times a day (QID) | ORAL | Status: DC | PRN

## 2017-06-23 MED ORDER — OXYCODONE HCL 5 MG PO TABS: 5.0000 mg | ORAL_TABLET | Freq: Four times a day (QID) | ORAL | Status: DC | PRN

## 2017-06-23 MED ORDER — ADULT MULTIVITAMIN W/MINERALS CH: 1.0000 | ORAL_TABLET | Freq: Every day | ORAL | Status: DC

## 2017-06-23 NOTE — Evaluation (Signed)
Occupational Therapy Evaluation Patient Details Name: [name] MRN: [id number] DOB: [birth year] Today's Date: 06/23/2017    History of Present Illness 51 y.o. female with medical history significant of dilated cardiomyopathy with ejection fraction of 20-25%, diabetes mellitus type 2 uncontrolled with chronic kidney disease stage III, uncontrolled hypertension, cocaine abuse, tobacco abuse, chronic obstructive pulmonary disease, hyperlipidemia, and medical noncompliance who presents the emergency department stating that she can no longer walk her legs won't hold her up.     Clinical Impression   PTA, pt was living alone in a motel and reports that she was doing her ADLs and light IADLs. Pt currently requiring Min A for UB ADLs, Mod A for LB ADLs, and Min A +2 for stand pivot with RW. Pt presenting with decreased strength and sensation of BLEs, endurance, and activity tolerance. Pt would benefit form further acute OT to facilitate safe dc and increase occupational performance. Recommend dc to SNF for further OT to increase safety and independence with ADLs and functional mobility.     Follow Up Recommendations  SNF;Supervision/Assistance - 24 hour    Equipment Recommendations  Other (comment) (Defet to next venue)    Recommendations for Other Services PT consult     Precautions / Restrictions Precautions Precautions: Fall Restrictions Weight Bearing Restrictions: No      Mobility Bed Mobility Overal bed mobility: Needs Assistance Bed Mobility: Supine to Sit     Supine to sit: Min guard;HOB elevated     General bed mobility comments: Min Guard for safety. HOB elevated and pt used bed rail to pull hips towards EOB.   Transfers Overall transfer level: Needs assistance Equipment used: Rolling walker (2 wheeled) Transfers: Sit to/from Stand Sit to Stand: Min assist;+2 safety/equipment         General transfer comment: Min A +2 for safety. Vcs for hand placement     Balance Overall balance assessment: Needs assistance Sitting-balance support: No upper extremity supported;Feet supported Sitting balance-Leahy Scale: Fair     Standing balance support: Bilateral upper extremity supported;During functional activity Standing balance-Leahy Scale: Poor Standing balance comment: reliant on physical a and UE support                           ADL either performed or assessed with clinical judgement   ADL Overall ADL's : Needs assistance/impaired Eating/Feeding: Set up;Sitting   Grooming: Set up;Sitting   Upper Body Bathing: Sitting;Minimal assistance   Lower Body Bathing: Sit to/from stand;Moderate assistance;+2 for safety/equipment   Upper Body Dressing : Sitting;Minimal assistance Upper Body Dressing Details (indicate cue type and reason): Donned new gown with Min A Lower Body Dressing: Sit to/from stand;Moderate assistance;+2 for safety/equipment   Toilet Transfer: Minimal assistance;+2 for safety/equipment;RW;Stand-pivot Toilet Transfer Details (indicate cue type and reason): Stand pivot to recliner with Min A +2 for safety. Pt demonstrating poor proprioception and sensation in BLEs.          Functional mobility during ADLs: Minimal assistance;+2 for safety/equipment;Rolling walker (Stand pivot only) General ADL Comments: Pt demonstrating decreased activity tolernace and endurance. Pt demonstrating heavy breathing during activity.      Vision Baseline Vision/History: Wears glasses       Perception     Praxis      Pertinent Vitals/Pain Pain Assessment: Faces Faces Pain Scale: No hurt Pain Intervention(s): Monitored during session     Hand Dominance Right   Extremity/Trunk Assessment Upper Extremity Assessment Upper Extremity Assessment: Overall WFL for  tasks assessed   Lower Extremity Assessment Lower Extremity Assessment: Defer to PT evaluation;RLE deficits/detail;LLE deficits/detail RLE Deficits / Details: Very  poor sensation in BLEs. When closing her eyes, pt would guess if OT was touching left or right foot and was very inconsistant. RLE Sensation: decreased light touch;decreased proprioception LLE Deficits / Details: Very poor sensation in BLEs. When closing her eyes, pt would guess if OT was touching left or right foot and was very inconsistant. LLE Sensation: decreased light touch;decreased proprioception   Cervical / Trunk Assessment Cervical / Trunk Assessment: Normal   Communication Communication Communication: No difficulties   Cognition Arousal/Alertness: Awake/alert Behavior During Therapy: Impulsive Overall Cognitive Status: No family/caregiver present to determine baseline cognitive functioning                                     General Comments  Pt sister's arrived at end of session    Exercises     Shoulder Instructions      Home Living Family/patient expects to be discharged to:: Other (Comment) (Motel) Living Arrangements: Alone           Home Layout: One level     Bathroom Shower/Tub: Corporate investment banker: Standard     Home Equipment: None   Additional Comments: Pt states she has no DME and lives in a motel with her several cats and dog      Prior Functioning/Environment Level of Independence: Independent        Comments: Pt reports that she does all her own ADLs and IADLs. Pt reporting poor activity tolerance and breathing        OT Problem List: Decreased strength;Decreased range of motion;Decreased activity tolerance;Impaired balance (sitting and/or standing);Decreased safety awareness;Decreased knowledge of use of DME or AE;Decreased knowledge of precautions;Impaired sensation      OT Treatment/Interventions: Self-care/ADL training;Therapeutic exercise;Energy conservation;DME and/or AE instruction;Therapeutic activities;Patient/family education    OT Goals(Current goals can be found in the care plan  section) Acute Rehab OT Goals Patient Stated Goal: Get stronger OT Goal Formulation: With patient Time For Goal Achievement: 07/07/17 Potential to Achieve Goals: Good ADL Goals Pt Will Perform Upper Body Dressing: with supervision;with set-up;sitting Pt Will Perform Lower Body Dressing: with min guard assist;sit to/from stand Pt Will Transfer to Toilet: with min guard assist;ambulating;bedside commode Pt Will Perform Toileting - Clothing Manipulation and hygiene: with min guard assist;sit to/from stand  OT Frequency: Min 2X/week   Barriers to D/C:            Co-evaluation              AM-PAC PT "6 Clicks" Daily Activity     Outcome Measure Help from another person eating meals?: None Help from another person taking care of personal grooming?: A Little Help from another person toileting, which includes using toliet, bedpan, or urinal?: A Little Help from another person bathing (including washing, rinsing, drying)?: A Little Help from another person to put on and taking off regular upper body clothing?: A Little Help from another person to put on and taking off regular lower body clothing?: A Little 6 Click Score: 19   End of Session Equipment Utilized During Treatment: Gait belt;Rolling walker Nurse Communication: Mobility status  Activity Tolerance: Patient tolerated treatment well;Patient limited by fatigue Patient left: in chair;with call bell/phone within reach;with chair alarm set;with family/visitor present  OT Visit Diagnosis: Unsteadiness on feet (R26.81);Other  abnormalities of gait and mobility (R26.89);Muscle weakness (generalized) (M62.81)                Time: 7741-2878 OT Time Calculation (min): 27 min Charges:  OT General Charges $OT Visit: 1 Visit OT Evaluation $OT Eval Moderate Complexity: 1 Mod OT Treatments $Self Care/Home Management : 8-22 mins G-Codes:     Anaise Sterbenz MSOT, OTR/L Acute Rehab Pager: [phone] Office:  [place] 06/23/2017, 12:42 PM

## 2017-06-23 NOTE — Progress Notes (Addendum)
Nutrition Consult/Brief Note  RD consulted for nutrition education regarding diabetes.  Pt did not respond when RD introduced self and reason for visit.  Lab Results  Component Value Date   HGBA1C >15.5 (H) 06/22/2017   Provided "Carbohydrate Counting for People with Diabetes" handout from the Academy of [employer]. Left on tray table.  Body mass index is 34.2 kg/m. Pt meets criteria for Obesity Class I based on current BMI.  Current diet order is Heart Healthy/Carbohydrate Modified. Labs and medications reviewed. CBG's [phone].  If additional nutrition issues arise, please re-consult RD.  Arthur Holms, RD, LDN Pager #: [phone] After-Hours Pager #: [phone]

## 2017-06-23 NOTE — Progress Notes (Signed)
Per MD order 250 Ml bolus was given. X-ray was obtained. Patient taken to CT by rapid response and charge nurse. Patient was given Narcan. Pt is able to report her name and location. Current Bp 105/85, HR 96 (monitor). O2 96 (RA)

## 2017-06-23 NOTE — Progress Notes (Signed)
Progress Note  Patient Name: [name] Date of Encounter: 06/23/2017  Primary Cardiologist: Dr. Bronson Ing  Subjective   Reports continued weakness along her lower extremities but able to move them spontaneously. Having mild dyspnea. No chest pain or palpitations.   Inpatient Medications    Scheduled Meds: . aspirin EC  81 mg Oral Daily  . bisoprolol  5 mg Oral Daily  . clopidogrel  75 mg Oral Q breakfast  . cycloSPORINE  1 drop Both Eyes BID  . enoxaparin (LOVENOX) injection  40 mg Subcutaneous Q24H  . ezetimibe  10 mg Oral Daily  . fenofibrate  160 mg Oral Daily  . FLUoxetine  20 mg Oral Daily  . folic acid  1 mg Oral Daily  . furosemide  40 mg Intravenous Daily  . gabapentin  100 mg Oral TID  . hydrOXYzine  50 mg Oral QHS  . insulin aspart  0-5 Units Subcutaneous QHS  . insulin aspart  0-9 Units Subcutaneous TID WC  . insulin aspart  3 Units Subcutaneous TID WC  . mometasone-formoterol  2 puff Inhalation BID  . montelukast  10 mg Oral QHS  . multivitamin with minerals  1 tablet Oral Daily  . pantoprazole  40 mg Oral Daily  . potassium chloride  30 mEq Oral BID  . rosuvastatin  40 mg Oral Daily  . sodium chloride flush  3 mL Intravenous Q12H  . thiamine  100 mg Oral Daily  . tiotropium  18 mcg Inhalation Daily  . traMADol  50 mg Oral BID  . traZODone  50 mg Oral QHS  . [START ON 06/24/2017] Vitamin D (Ergocalciferol)  50,000 Units Oral Weekly   Continuous Infusions: . insulin (NOVOLIN-R) infusion Stopped (06/22/17 0457)   PRN Meds: acetaminophen **OR** acetaminophen, bisacodyl, ipratropium-albuterol, magnesium citrate, ondansetron **OR** ondansetron (ZOFRAN) IV, oxyCODONE, polyethylene glycol, tizanidine   Vital Signs    Vitals:   06/22/17 1715 06/22/17 2112 06/23/17 0057 06/23/17 0457  BP: (!) 138/106 (!) 133/98 (!) 139/91 133/88  Pulse: 77 79 77 79  Resp: 18 20 18 20   Temp: 98.3 F (36.8 C) 98.5 F (36.9 C) 98.1 F (36.7 C) 98.6 F (37 C)    TempSrc: Oral Oral Oral Oral  SpO2: 98% 99% 99% 99%  Weight:    181 lb (82.1 kg)  Height:        Intake/Output Summary (Last 24 hours) at 06/23/17 0848 Last data filed at 06/23/17 0458  Gross per 24 hour  Intake             55.6 ml  Output             2050 ml  Net          -1994.4 ml   Filed Weights   06/21/17 1554 06/23/17 0457  Weight: 183 lb (83 kg) 181 lb (82.1 kg)    Telemetry    NSR, HR in 70's - 90's. Occasional PVC's.  - Personally Reviewed  ECG    No new tracings.   Physical Exam   General: Well developed, well nourished African American female appearing in no acute distress. Head: Normocephalic, atraumatic.  Neck: Supple without bruits, JVD not elevated. Lungs:  Resp regular and unlabored, CTA without wheezing or rales. Heart: RRR, S1, S2, no S3, S4, or murmur; no rub. Abdomen: Soft, non-tender, non-distended with normoactive bowel sounds. No hepatomegaly. No rebound/guarding. No obvious abdominal masses. Extremities: No clubbing or cyanosis, trace lower extremity edema. Distal pedal pulses are  2+ bilaterally. Neuro: Alert and oriented X 3. Moves all extremities spontaneously. Psych: Normal affect.  Labs    Chemistry Recent Labs Lab 06/21/17 1637 06/22/17 0337 06/23/17 0631  NA 134* 140 140  K 2.9* 3.2* 4.1  CL 96* 107 107  CO2 27 26 23   GLUCOSE 629* 107* 202*  BUN 23* 20 18  CREATININE 1.47* 1.36* 1.39*  CALCIUM 9.1 8.2* 8.4*  PROT 5.7*  --   --   ALBUMIN 3.1*  --   --   AST 13*  --   --   ALT 31  --   --   ALKPHOS 178*  --   --   BILITOT 1.2  --   --   GFRNONAA 40* 44* 42*  GFRAA 46* 50* 49*  ANIONGAP 11 7 10      Hematology Recent Labs Lab 06/21/17 1637 06/22/17 0337 06/23/17 0631  WBC 7.4 8.1 11.2*  RBC 4.36 4.29 4.21  HGB 14.1 13.6 13.2  HCT 41.4 41.5 40.9  MCV 95.0 96.7 97.1  MCH 32.3 31.7 31.4  MCHC 34.1 32.8 32.3  RDW 13.8 14.2 14.6  PLT 194 194 186    Cardiac Enzymes Recent Labs Lab 06/21/17 1637 06/21/17 2129  06/22/17 0337  TROPONINI 0.32* 0.46* 0.41*   No results for input(s): TROPIPOC in the last 168 hours.   BNP Recent Labs Lab 06/21/17 1637 06/22/17 0337  BNP 1,152.0* 835.0*     DDimer No results for input(s): DDIMER in the last 168 hours.   Radiology    Dg Lumbar Spine Complete  Result Date: 06/21/2017 CLINICAL DATA:  Acute low back pain for 3 days. EXAM: LUMBAR SPINE - COMPLETE 4+ VIEW COMPARISON:  05/09/2015 and prior studies FINDINGS: No acute fracture noted. Increased severe disc space loss at L3-4 noted with 6 mm anterolisthesis of L3 on L4, new since 05/09/2015. Moderate to severe degenerative disc disease and spondylosis at L4-5 and L5-S1 again identified. Mild multilevel degenerative disc disease otherwise noted. No suspicious focal bony lesion or spondylolysis identified. IMPRESSION: 1. Increased severe degenerative disc disease at L3-4 with new grade 1 anterolisthesis at this level. 2. Moderate to severe degenerative changes at L4-5 and L5-S1 again identified. Electronically Signed   By: Margarette Canada M.D.   On: 06/21/2017 17:22   Ct Head Wo Contrast  Result Date: 06/22/2017 CLINICAL DATA:  Left lower extremity weakness beginning yesterday. EXAM: CT HEAD WITHOUT CONTRAST TECHNIQUE: Contiguous axial images were obtained from the base of the skull through the vertex without intravenous contrast. COMPARISON:  04/03/2015 FINDINGS: Brain: Premature chronic small vessel ischemia with remote lacunar infarcts in the bilateral thalamus and left caudate head. Small remote bilateral cerebellar infarcts. Ischemic gliosis in the cerebral white matter. No evidence of acute infarct, hemorrhage, hydrocephalus, or masslike findings. Vascular: Vertebrobasilar tortuosity. Atherosclerotic calcification. No hyperdense vessel. Skull: No acute or aggressive finding Sinuses/Orbits: Remote blowout fracture of the medial wall left orbit. IMPRESSION: 1. No acute finding. 2. Premature chronic small vessel  ischemia with remote lacunar infarcts, as described. Electronically Signed   By: Monte Fantasia M.D.   On: 06/22/2017 07:22   Mr Lumbar Spine Wo Contrast  Result Date: 06/21/2017 CLINICAL DATA:  Weakness for 1 week. History of diabetes. Assess for cauda equina syndrome. EXAM: MRI LUMBAR SPINE WITHOUT CONTRAST TECHNIQUE: Multiplanar, multisequence MR imaging of the lumbar spine was performed. No intravenous contrast was administered. COMPARISON:  Lumbar spine radiographs June 21, 2017 at 1651 hours and MRI of lumbar spine May 09, 2015 FINDINGS: SEGMENTATION: For the purposes of this report, the last well-formed intervertebral disc will be reported as L5-S1. ALIGNMENT: Maintained lumbar lordosis. Grade 1 L3-4 anterolisthesis. Minimal grade 1 L4-5 anterolisthesis. Minimal grade 1 L5-S1 retrolisthesis. No spondylolysis. VERTEBRAE:Vertebral bodies are intact. Severe L3-4 and L5-S1 disc height loss progressed from prior examination. Moderate L4-5 disc height loss with disc desiccation all levels compatible with degenerative discs. Focal edema L3-4 disc. Moderate to severe subacute or chronic discogenic endplate changes X3-A3. Mild L3-4 and L4-5 acute on chronic discogenic endplate changes with trace acute component L3-4. Scalloping posterior L3 vertebral body associated with low T1 and bright T2 signal within Batson's plexus. Minimal bone marrow edema L3-4 facet partially volumes with effusion. CONUS MEDULLARIS: Conus medullaris terminates at L1-2 and demonstrates normal morphology and signal characteristics. Central displacement of the cauda equina with disorganized appearance. PARASPINAL AND SOFT TISSUES: Included prevertebral and paraspinal soft tissues are nonacute. Multiple small low signal masses within the uterus compatible with leiomyoma. Trace L3-4 interspinous fluid signal. DISC LEVELS: T11-12: Small broad-based disc bulge, mild facet arthropathy and ligamentum flavum redundancy. Mild canal stenosis  without neural foraminal narrowing. T12-L1, L1-2: No disc bulge, canal stenosis nor neural foraminal narrowing. L2-3: Small broad-based disc bulge. Minimal facet arthropathy without canal stenosis. No neural foraminal narrowing. L3-4: Anterolisthesis. Large broad-based disc bulge with superimposed LEFT central 5 x 6 mm disc extrusion, 12 mm superior migration. Moderate facet arthropathy and ligamentum flavum redundancy with trace facet effusions. Severe canal stenosis. Severe bilateral neural foraminal narrowing and, encroaching upon the exited L 3 nerve. Lateral recess narrowing may affect the traversing L4 nerves. L4-5: Anterolisthesis. Moderate broad-based disc bulge. Moderate to severe facet arthropathy and ligamentum flavum redundancy with trace facet effusions which are likely reactive. Mild canal stenosis including partially effaced LEFT and RIGHT lateral recesses which may affect the traversing L5 nerves. Moderate to severe RIGHT, severe LEFT neural foraminal narrowing. Encroachment upon at LEFT greater than RIGHT exited L4 nerve. L5-S1: Small to moderate broad-based disc bulge asymmetric to the RIGHT. Mild facet arthropathy. No canal stenosis. Severe bilateral neural foraminal narrowing. IMPRESSION: 1. Grade 1 L3-4 and L4-5 anterolisthesis. Grade 1 L5-S1 retrolisthesis. No spondylolysis. 2. Progressed degenerative change of lumbar spine. Superimposed 5 x 6 x 12 mm L3-4 disc extrusion. 3. Severe canal stenosis L3-4 with resultant disorganized cauda equina. Mild canal stenosis L4-5. 4. Severe neural foraminal narrowing L3-4 through L5-S1. 5. L3-4 inflammatory facet arthropathy. 6. Progressive scalloping of L3 posterior vertebral body suggesting chronic venous changes. Findings would be better characterized on contrast-enhanced images. Electronically Signed   By: Elon Alas M.D.   On: 06/21/2017 18:26   Dg Chest Port 1 View  Result Date: 06/21/2017 CLINICAL DATA:  Weakness for 1 week. EXAM: PORTABLE  CHEST 1 VIEW COMPARISON:  05/25/2016. FINDINGS: Stable enlarged cardiac silhouette. Mildly prominent pulmonary vasculature with improvement. The interstitial markings are mildly prominent. No pleural fluid seen. Mild bilateral shoulder degenerative changes. IMPRESSION: 1. Mild pulmonary vascular congestion with improvement. 2. Stable cardiomegaly. 3. Mild chronic interstitial lung disease. Electronically Signed   By: Claudie Revering M.D.   On: 06/21/2017 16:52    Cardiac Studies   Cardiac Catheterization: 55/73/2202  LV end diastolic pressure is mildly elevated.  There is no aortic valve stenosis.  No angiographically apparent coronary artery disease.  Tortuous right subclavian requiring Glide wire. Would not use right radial approach if cath was needed in the future.  Continue aggressive medical therapy and blood pressure control.  Echocardiogram: 06/22/2017 Study Conclusions  - Left ventricle: The cavity size was normal. Wall thickness was   increased in a pattern of moderate LVH. Systolic function was   moderately to severely reduced. The estimated ejection fraction   was in the range of 30% to 35%. Diffuse hypokinesis. Features are   consistent with a pseudonormal left ventricular filling pattern,   with concomitant abnormal relaxation and increased filling   pressure (grade 2 diastolic dysfunction). - Mitral valve: There was moderate regurgitation directed   posteriorly. - Left atrium: The atrium was mildly dilated.  Patient Profile     51 y.o. female w/ PMH of hypertensive cardiomyopathy (EF 20-25% by echo in 05/2016), HTN, HLD, Type 2 DM, COPD, Stage 3 CKD, and normal cors by cath in 07/2016 who presented to [facility] on 06/21/2017 for lower extremity weakness. Cardiology consulted for pre-op clearance and CHF.   Assessment & Plan    1. Preoperative Cardiac Clearance for Lumbar disectomy and decompression - presented with weakness and numbness of her lower  extremities. Lumbar MRI showed Grade 1 L3-4 and L4-5 anterolisthesis, Grade 1 L5-S1 retrolisthesis with progressive degenerative change of lumbar spine and superimposed 5 x 6 x 12 mm L3-4 disc extrusion with resultant disorganized cauda equina.  - Neurosurgery has been consulted and initially recommended a lumbar disectomy and decompression. Unclear if she will still be going to the OR.  - from a cardiac perspective, she had normal cors by cath last year and she denies any recent chest pain. Has baseline dyspnea on exertion which has been unchanged. Would not pursue further ischemic evaluation prior to the planned procedure with her recent cath. She is of acceptable cardiac risk to proceed. Follow fluid status in the post-operative setting.   2. Hypertensive cardiomyopathy/ Acute on Chronic Systolic CHF - EF 66-29% by echo in 05/2016 with cath in 07/2016 showing normal cors. Repeat echo this admission shows mild improvement with EF now at 30-35%.  - previously on Amlodipine 10mg  daily, Benazepril 40mg  daily, Bisoprolol 5mg  daily, Clonidine 0.2 mg once daily, Plavix 75mg  daily, Zetia, Lasix 20mg  daily, Crestor 40mg  daily, and Spironolactone 25mg  BID in the outpatient setting but reports not taking any of the medications due to them making her feel fatigued. - BNP was elevated at 1152 on admission and she has been started on IV Lasix 40mg  daily with a net output of -1.8L yesterday. Continue diuresis. Has been restarted on BB therapy. Will restart ARB with her known reduced EF.   3. Elevated Troponin - initial troponin 0.32 with repeat values of 0.46 and 0.41. Unclear why cardiac enzymes were checked as she did not report any chest pain.  - she has known hypertensive cardiomyopathy as above with catheterization in 07/2016 showing normal cors.  - repeat echo shows slight improvement in EF when compared to 07/2016. No plans for further ischemic evaluation at this time.   4. HTN - BP has been stable at  123/106 - 139/112 in the past 24 hours. - has been restarted on BB therapy. Will start Losartan 25mg  daily in the setting of her reduced EF and reports of a dry cough with her ACE-I in the past (favor ARB over ACE-I for if she is compliant in the outpatient setting, could consider initiation of Entresto but she first needs to demonstrate compliance).   5. Stage 3 CKD - creatine stable at 1.39 this AM. Close to baseline.   6. Atrial tachycardia/ PAC's - noted to have episodes of atrial  tachycardia on admission. Improved with resumption of PTA Bisoprolol.  7. Type 2 DM - glucose elevated to 629 upon arrival to the ED. Improved to 202 on most recent check. - Hgb A1c pending. Per admitting team.   8. Medical Noncompliance/ Substance Use - previously self-discontinued her cardiac medications. Compliance with these strongly encouraged. - also uses Cocaine intermittently and consumes 2-3 alcoholic beverages per day. Cessation advised.    Signed, Erma Heritage , PA-C 8:48 AM 06/23/2017 Pager: [phone] As above, patient seen and examined. She denies dyspnea or chest pain at present. Her echocardiogram shows ejection fraction 30-35%. This is felt likely hypertensive mediated. She has been noncompliant with medications. Would continue present dose of bisoprolol, Lasix. Resume Cozaar 25 mg daily. Medications can be titrated based on follow-up blood pressure. Previous catheterization revealed no coronary disease. She may proceed with her surgery without further cardiac evaluation.  Kirk Ruths, MD

## 2017-06-23 NOTE — Progress Notes (Signed)
PT Cancellation Note  Patient Details Name: [name] MRN: [id number] DOB: [birth year]   Cancelled Treatment:    Reason Eval/Treat Not Completed: Medical issues which prohibited therapy.  Walked into pt's room with RN and Engineer, manufacturing assessing pt's breathing.  Increased WOB and pt with increased confusion per RN compared to earlier this AM.  Two person max assist with pt resisting movement from recliner back to bed.  Pt getting a bit agitated and combative at times tangential speech unrelated to questions asked by RN or RT in room.  HR 88 on monitor difficult to get BP or O2 sats as pt is continually moving.  PT to defer assessment until tomorrow.   Thanks,    [name]. Lamb, [place], DPT [phone]   06/23/2017, 5:29 PM

## 2017-06-23 NOTE — Progress Notes (Signed)
RN Walked into pt's room  and noticed patient sitting to the side and slumped over . RN  assesed pt's breathing & noticed Increased WOB and pt with increased confusion compared to earlier this AM.  Two person max assist with pt resisting movement from recliner back to bed.  Pt getting a bit agitated and combative at times tangential speech unrelated to questions. Initial blood pressure was 71/51 automatic. RN took a manual and got 86/58 manual, O2 89 (ra). RN placed patient on 2L. Respiratory therapist was called. Patient o2 dropped to 79 and O2 was increased to 6L. Patient continued to be confused and falling asleep. MD page notified.

## 2017-06-23 NOTE — Progress Notes (Signed)
Patient stated multiple times though out shift that she was in no pain.  However, patient requests that RN or NT move her in the bed because she can not move herself because she is in pain, but once she is moved by RN or NT patient then states she has not been in pain for a long time.  Patient advised RN that she was having an "episode", however when asked her to describe how she was feeling she didn't know that just knows it is an "eposide".  RN asked if there was something RN could do to help her feel better, patient advised no.

## 2017-06-23 NOTE — Progress Notes (Signed)
Inpatient Diabetes Program Recommendations  AACE/ADA: New Consensus Statement on Inpatient Glycemic Control (2015)  Target Ranges:  Prepandial:   less than 140 mg/dL      Peak postprandial:   less than 180 mg/dL (1-2 hours)      Critically ill patients:  140 - 180 mg/dL   Lab Results  Component Value Date   GLUCAP 208 (H) 06/23/2017   HGBA1C >15.5 (H) 06/22/2017    Attempted to speak with patient. Patient was awake and alert but elected to shut her eyes and not want to talk about the circumstances surrounding her not taking her medications. Patient's sisters in room giving patient a bath. Sister said it wasn't an issue of affordability or availability. A1c extremely high, however inpatient DM management regimen is minimal, could be the body's response to polysubstance use. When I left room patient was talking to sisters and brushing her teeth. Unsure what resources or education would be appropriate for patient as long as she elects to participate in polysubstance use and not willing to talk to me.  Thanks,  Tama Headings RN, MSN, [facility] Inpatient Diabetes Coordinator Team Pager [phone] (8a-5p)

## 2017-06-23 NOTE — Progress Notes (Signed)
PROGRESS NOTE    [name]  WUJ:[id number] DOB: April 08, 1966 DOA: 06/21/2017 PCP: [name], MD   Brief Narrative:  [name] a 51 y.o.femalewith medical history significant of dilated cardiomyopathy with previous ejection fraction of 20-25% now 30-35%, diabetes mellitus type 2, uncontrolled with chronic kidney disease stage III, uncontrolled hypertension, cocaine abuse, tobacco abuse, chronic obstructive pulmonary disease, hyperlipidemia, and medical noncompliance who presents the emergency department stating that she can no longer walk her legs won't hold her up. Patient states that she has not been taking any of her medications for her heart in 2 months. She stopped her diabetes medications 2 weeks ago. She lives by herself in a motel. Home health was coming to help her until the home health company went out of business. She is usually able to walk over the last couple of days she's become increasingly weak late last night or very early this morning she was unable to get up off the commode because of bilateral leg weakness. She slid to the ground and crawled out to the main area of the motel room where she remained in a crosslegged sitting pattern until family came approximately 12 hours later. She didn't call 911 because she did not want to come to the hospital.   Ultimately when family found her and they convinced her come into the emergency department. She has complaints of increased swelling in her legs (not taking her Lasix) and shortness of breath. She feels generally ill which has been progressive and now become severe. She denies any fevers. She has had poor urine output recently not making much urine at all. She has increased thirst. She has no chest pain she denies palpitations. She says she has serious chronic low back pain and this has not worsened but the weakness is severe. Also for past few days she has been having intermittent myoclonic jerking which lasted  about 30 seconds and resolves. She has no other shaking. He has made her symptoms better nothing has made them worse. Symptoms have been chronic and worsening since they developed. She was admitted for Elevated Troponin, CHF Exacerbation, and Back Pain with inability to walk.   On the afternoon of 06/23/17 a Rapid Response was called as the patient became Hypoxic and desaturated to 79% on 6 Liters. Patient was also Hypotensive and confused. She was bolused 250 mL of NS and had her Antihypertensives stopped. Saturations eventually came up.  An ABG was obtained which was unremarkable, a Stat Portable CXR which was normal, and patient was also given 1x of Narcan. Patient was placed on CIWA protocol, ordered a Head CT Scan for her confusion/sleepiness and was also ordered a CTA Chest to r/o PE (reduced Contrast dose). PCCM was consulted to evaluate for patient's Hypoxia episode and patient was transferred to SDU.   Assessment & Plan:   Principal Problem:   Elevated troponin Active Problems:   Medical non-compliance   Hypokalemia   CKD (chronic kidney disease) stage 3, GFR 30-59 ml/min   Polysubstance abuse (including cocaine)   Cigarette nicotine dependence with nicotine-induced disorder   Hyperlipidemia LDL goal <70   Severe left ventricular systolic dysfunction   Spinal stenosis of lumbar region   DM (diabetes mellitus), type 2, uncontrolled, with renal complications (HCC)  Elevated Troponin -Likely demand ischemia -Troponin 0.32, 0.46, 0.41 (relatively flat) -Denied chest pain -Cardiology consulted appreciated -Echocardiogram pending -Patient had heart cath 07/28/2016 which showed no apparent coronary artery disease, continue aggressive medical therapy and both  pressure control -Patient currently on aspirin and Plavix  Acute systolic CHF exacerbation -BNP on admission 1152 -CXR showed mild pulmonary vascular congestion -Last echocardiogram 05/26/2016 showed an EF of 20-25%, restrictive  physiology, decreased left ventricular diastolic compliance and or increased left atrial pressure -Repeat ECHO showed improved EF -Continue IV Lasix 40 mg Daily -Cardiology restarted BB and Losartan today however patient became Hypotensive this Afternoon and so BB and Losartan held and patient bolused 250 mL of NS -Will need to discuss with Cardiology about Resumption given her recent Hypotensive Episode -Monitor intake and output, daily weights  Hypoxia Episode with Confusion/AMS and Hypotension -Desaturated to 79% on 6 Liters and eventually came back to the 90's after being confused and hypotensive -ABG showed 7.462/33.0/136/23.2/98.8% on 5 Liters -Bolused 250 mL of NS -Not on Home O2; C/w supplemental O2 as needed and may need BiPAP -Transfer to SDU for closer monitoring -Obtain Head CT w/o Contrast -Obtain CTA of Chest to r/o PE (reduced dose) -Held Antihypertensives including BB and Losartan  -Started patient on scheduled Nebs -Given 1x of Narcan with no improvement -Placed on CIWA Protocol for ? EtOH Witdrawal; C/w MVI, Folic Acid, and Thiamine -PCCM Consulted for further evaluation and Recommendations  Back pain with spinal stenosis of the lumbar region -MRI showed L3-L4 canal stenosis of disorganized cauda equina -Neurosurgery consultation appreciated and originally patient was supposed to go for Lumbar Discectomy/Decompression for Herniated Disc at Levels L3/4 but unclear whether she will go or not; Cardiology cleared patient -Continue pain control and decreased PRN Oxycodone Frequency  Diabetes mellitus, type II, complicated by hyperglycemia -On arrival to emergency department, blood sugars over 600 -HbA1c >15.5 -Continue Novolog SSI and CBG monitoring -Patient uses Prandin and Tradjenta at home, will hold for now -Diabetic Education Coordinator Consulted   Hypokalemia, improved -Replete.Continue to monitor BMP  Mild Leukocytosis -No S/Sx of Infection -WBC went from  8.1 -> 11.2 -Continue to Monitor and repeat CMP in AM   Chronic kidney disease, stage III -Creatinine currently 1.36, appears to be at baseline at 1.39 -Continue to monitor BMP closely as patient will be diuresing and now that she received Contrast for CTA  Polysubstance abuse -UDS positive for Cocaine -Cocaine Cessastion advised   Tobacco abuse -Cessation discussed  Hyperlipidemia -Continue statin, fenofibrate  GERD -Continue PPI  COPD -Stable, continue home medications  Anxiety/Depression -Continue Prozac  DVT prophylaxis: Enoxaparin 40 mg sq q24h Code Status: FULL CODE Family Communication: Discussed with Sisters bedside Disposition Plan: Transfer to SDU for closer monitoring tonight  Consultants:   Cardiology  Neurosurgery  PCCM   Procedures:  ECHOCARDIOGRAM Study Conclusions  - Left ventricle: The cavity size was normal. Wall thickness was   increased in a pattern of moderate LVH. Systolic function was   moderately to severely reduced. The estimated ejection fraction   was in the range of 30% to 35%. Diffuse hypokinesis. Features are   consistent with a pseudonormal left ventricular filling pattern,   with concomitant abnormal relaxation and increased filling   pressure (grade 2 diastolic dysfunction). - Mitral valve: There was moderate regurgitation directed   posteriorly. - Left atrium: The atrium was mildly dilated.   Antimicrobials:  Anti-infectives    None     Subjective: Seen and examined at bedside this AM and was doing ok but complained of SOB. Then later on in the afternoon became hypoxic, hypotensive, and confused. Rapid Response was called and patient was evaluated by myself. She improved after a little while but was  transferred to SDU for further monitoring and evaluation.   Objective: Vitals:   06/22/17 1715 06/22/17 2112 06/23/17 0057 06/23/17 0457  BP: (!) 138/106 (!) 133/98 (!) 139/91 133/88  Pulse: 77 79 77 79  Resp:  18 20 18 20   Temp: 98.3 F (36.8 C) 98.5 F (36.9 C) 98.1 F (36.7 C) 98.6 F (37 C)  TempSrc: Oral Oral Oral Oral  SpO2: 98% 99% 99% 99%  Weight:    82.1 kg (181 lb)  Height:        Intake/Output Summary (Last 24 hours) at 06/23/17 0757 Last data filed at 06/23/17 0458  Gross per 24 hour  Intake            155.6 ml  Output             2050 ml  Net          -1894.4 ml   Filed Weights   06/21/17 1554 06/23/17 0457  Weight: 83 kg (183 lb) 82.1 kg (181 lb)   Examination: Physical Exam:  Constitutional: WN/WD AAF in who appeared confused and somnolent but arousable Eyes: Lids and conjunctivae normal, sclerae anicteric  ENMT: External Ears, Nose appear normal. Grossly normal hearing. Mucous membranes are moist. Poor dentition  Neck: Appears normal, supple, no cervical masses, normal ROM, no appreciable thyromegaly, no JVD Respiratory: Diminished to auscultation bilaterally with some crackles, no wheezing, rales, rhonchi. Normal respiratory effort and patient is not tachypenic. No accessory muscle use.  Cardiovascular: RRR, no murmurs / rubs / gallops. S1 and S2 auscultated. Mild LE extremity edema.   Abdomen: Soft, non-tender, non-distended. No masses palpated. No appreciable hepatosplenomegaly. Bowel sounds positive.  GU: Deferred. Musculoskeletal: No clubbing / cyanosis of digits/nails. No joint deformity upper and lower extremities. Good ROM, no contractures.  Skin: No rashes, lesions, ulcers on a limited skin eval. No induration; Warm and dry.  Neurologic: CN 2-12 grossly intact with no focal deficits. Romberg sign cerebellar reflexes not assessed.  Psychiatric: Normal judgment and insight. Somnolent but arousable; appears slightly confused. Normal mood and appropriate affect.   Data Reviewed: I have personally reviewed following labs and imaging studies  CBC:  Recent Labs Lab 06/21/17 1637 06/22/17 0337 06/23/17 0631  WBC 7.4 8.1 11.2*  HGB 14.1 13.6 13.2  HCT 41.4  41.5 40.9  MCV 95.0 96.7 97.1  PLT 194 194 626   Basic Metabolic Panel:  Recent Labs Lab 06/21/17 1637 06/22/17 0337  NA 134* 140  K 2.9* 3.2*  CL 96* 107  CO2 27 26  GLUCOSE 629* 107*  BUN 23* 20  CREATININE 1.47* 1.36*  CALCIUM 9.1 8.2*  MG  --  1.9   GFR: Estimated Creatinine Clearance: 46.4 mL/min (A) (by C-G formula based on SCr of 1.36 mg/dL (H)). Liver Function Tests:  Recent Labs Lab 06/21/17 1637  AST 13*  ALT 31  ALKPHOS 178*  BILITOT 1.2  PROT 5.7*  ALBUMIN 3.1*   No results for input(s): LIPASE, AMYLASE in the last 168 hours. No results for input(s): AMMONIA in the last 168 hours. Coagulation Profile: No results for input(s): INR, PROTIME in the last 168 hours. Cardiac Enzymes:  Recent Labs Lab 06/21/17 1637 06/21/17 2129 06/22/17 0337  CKTOTAL 63  --   --   TROPONINI 0.32* 0.46* 0.41*   BNP (last 3 results) No results for input(s): PROBNP in the last 8760 hours. HbA1C: No results for input(s): HGBA1C in the last 72 hours. CBG:  Recent Labs Lab 06/22/17 1129  06/22/17 1522 06/22/17 1737 06/22/17 2111 06/23/17 0616  GLUCAP 155* 160* 182* 177* 208*   Lipid Profile: No results for input(s): CHOL, HDL, LDLCALC, TRIG, CHOLHDL, LDLDIRECT in the last 72 hours. Thyroid Function Tests: No results for input(s): TSH, T4TOTAL, FREET4, T3FREE, THYROIDAB in the last 72 hours. Anemia Panel: No results for input(s): VITAMINB12, FOLATE, FERRITIN, TIBC, IRON, RETICCTPCT in the last 72 hours. Sepsis Labs: No results for input(s): PROCALCITON, LATICACIDVEN in the last 168 hours.  No results found for this or any previous visit (from the past 240 hour(s)).   Radiology Studies: Dg Lumbar Spine Complete  Result Date: 06/21/2017 CLINICAL DATA:  Acute low back pain for 3 days. EXAM: LUMBAR SPINE - COMPLETE 4+ VIEW COMPARISON:  05/09/2015 and prior studies FINDINGS: No acute fracture noted. Increased severe disc space loss at L3-4 noted with 6 mm  anterolisthesis of L3 on L4, new since 05/09/2015. Moderate to severe degenerative disc disease and spondylosis at L4-5 and L5-S1 again identified. Mild multilevel degenerative disc disease otherwise noted. No suspicious focal bony lesion or spondylolysis identified. IMPRESSION: 1. Increased severe degenerative disc disease at L3-4 with new grade 1 anterolisthesis at this level. 2. Moderate to severe degenerative changes at L4-5 and L5-S1 again identified. Electronically Signed   By: Margarette Canada M.D.   On: 06/21/2017 17:22   Ct Head Wo Contrast  Result Date: 06/22/2017 CLINICAL DATA:  Left lower extremity weakness beginning yesterday. EXAM: CT HEAD WITHOUT CONTRAST TECHNIQUE: Contiguous axial images were obtained from the base of the skull through the vertex without intravenous contrast. COMPARISON:  04/03/2015 FINDINGS: Brain: Premature chronic small vessel ischemia with remote lacunar infarcts in the bilateral thalamus and left caudate head. Small remote bilateral cerebellar infarcts. Ischemic gliosis in the cerebral white matter. No evidence of acute infarct, hemorrhage, hydrocephalus, or masslike findings. Vascular: Vertebrobasilar tortuosity. Atherosclerotic calcification. No hyperdense vessel. Skull: No acute or aggressive finding Sinuses/Orbits: Remote blowout fracture of the medial wall left orbit. IMPRESSION: 1. No acute finding. 2. Premature chronic small vessel ischemia with remote lacunar infarcts, as described. Electronically Signed   By: Monte Fantasia M.D.   On: 06/22/2017 07:22   Mr Lumbar Spine Wo Contrast  Result Date: 06/21/2017 CLINICAL DATA:  Weakness for 1 week. History of diabetes. Assess for cauda equina syndrome. EXAM: MRI LUMBAR SPINE WITHOUT CONTRAST TECHNIQUE: Multiplanar, multisequence MR imaging of the lumbar spine was performed. No intravenous contrast was administered. COMPARISON:  Lumbar spine radiographs June 21, 2017 at 1651 hours and MRI of lumbar spine May 09, 2015  FINDINGS: SEGMENTATION: For the purposes of this report, the last well-formed intervertebral disc will be reported as L5-S1. ALIGNMENT: Maintained lumbar lordosis. Grade 1 L3-4 anterolisthesis. Minimal grade 1 L4-5 anterolisthesis. Minimal grade 1 L5-S1 retrolisthesis. No spondylolysis. VERTEBRAE:Vertebral bodies are intact. Severe L3-4 and L5-S1 disc height loss progressed from prior examination. Moderate L4-5 disc height loss with disc desiccation all levels compatible with degenerative discs. Focal edema L3-4 disc. Moderate to severe subacute or chronic discogenic endplate changes G6-Y6. Mild L3-4 and L4-5 acute on chronic discogenic endplate changes with trace acute component L3-4. Scalloping posterior L3 vertebral body associated with low T1 and bright T2 signal within Batson's plexus. Minimal bone marrow edema L3-4 facet partially volumes with effusion. CONUS MEDULLARIS: Conus medullaris terminates at L1-2 and demonstrates normal morphology and signal characteristics. Central displacement of the cauda equina with disorganized appearance. PARASPINAL AND SOFT TISSUES: Included prevertebral and paraspinal soft tissues are nonacute. Multiple small low signal masses within  the uterus compatible with leiomyoma. Trace L3-4 interspinous fluid signal. DISC LEVELS: T11-12: Small broad-based disc bulge, mild facet arthropathy and ligamentum flavum redundancy. Mild canal stenosis without neural foraminal narrowing. T12-L1, L1-2: No disc bulge, canal stenosis nor neural foraminal narrowing. L2-3: Small broad-based disc bulge. Minimal facet arthropathy without canal stenosis. No neural foraminal narrowing. L3-4: Anterolisthesis. Large broad-based disc bulge with superimposed LEFT central 5 x 6 mm disc extrusion, 12 mm superior migration. Moderate facet arthropathy and ligamentum flavum redundancy with trace facet effusions. Severe canal stenosis. Severe bilateral neural foraminal narrowing and, encroaching upon the exited L  3 nerve. Lateral recess narrowing may affect the traversing L4 nerves. L4-5: Anterolisthesis. Moderate broad-based disc bulge. Moderate to severe facet arthropathy and ligamentum flavum redundancy with trace facet effusions which are likely reactive. Mild canal stenosis including partially effaced LEFT and RIGHT lateral recesses which may affect the traversing L5 nerves. Moderate to severe RIGHT, severe LEFT neural foraminal narrowing. Encroachment upon at LEFT greater than RIGHT exited L4 nerve. L5-S1: Small to moderate broad-based disc bulge asymmetric to the RIGHT. Mild facet arthropathy. No canal stenosis. Severe bilateral neural foraminal narrowing. IMPRESSION: 1. Grade 1 L3-4 and L4-5 anterolisthesis. Grade 1 L5-S1 retrolisthesis. No spondylolysis. 2. Progressed degenerative change of lumbar spine. Superimposed 5 x 6 x 12 mm L3-4 disc extrusion. 3. Severe canal stenosis L3-4 with resultant disorganized cauda equina. Mild canal stenosis L4-5. 4. Severe neural foraminal narrowing L3-4 through L5-S1. 5. L3-4 inflammatory facet arthropathy. 6. Progressive scalloping of L3 posterior vertebral body suggesting chronic venous changes. Findings would be better characterized on contrast-enhanced images. Electronically Signed   By: Elon Alas M.D.   On: 06/21/2017 18:26   Dg Chest Port 1 View  Result Date: 06/21/2017 CLINICAL DATA:  Weakness for 1 week. EXAM: PORTABLE CHEST 1 VIEW COMPARISON:  05/25/2016. FINDINGS: Stable enlarged cardiac silhouette. Mildly prominent pulmonary vasculature with improvement. The interstitial markings are mildly prominent. No pleural fluid seen. Mild bilateral shoulder degenerative changes. IMPRESSION: 1. Mild pulmonary vascular congestion with improvement. 2. Stable cardiomegaly. 3. Mild chronic interstitial lung disease. Electronically Signed   By: Claudie Revering M.D.   On: 06/21/2017 16:52   Scheduled Meds: . aspirin EC  81 mg Oral Daily  . bisoprolol  5 mg Oral Daily  .  clopidogrel  75 mg Oral Q breakfast  . cycloSPORINE  1 drop Both Eyes BID  . enoxaparin (LOVENOX) injection  40 mg Subcutaneous Q24H  . ezetimibe  10 mg Oral Daily  . fenofibrate  160 mg Oral Daily  . FLUoxetine  20 mg Oral Daily  . folic acid  1 mg Oral Daily  . furosemide  40 mg Intravenous Daily  . gabapentin  100 mg Oral TID  . hydrOXYzine  50 mg Oral QHS  . insulin aspart  0-5 Units Subcutaneous QHS  . insulin aspart  0-9 Units Subcutaneous TID WC  . insulin aspart  3 Units Subcutaneous TID WC  . mometasone-formoterol  2 puff Inhalation BID  . montelukast  10 mg Oral QHS  . multivitamin with minerals  1 tablet Oral Daily  . pantoprazole  40 mg Oral Daily  . potassium chloride  30 mEq Oral BID  . rosuvastatin  40 mg Oral Daily  . sodium chloride flush  3 mL Intravenous Q12H  . thiamine  100 mg Oral Daily  . tiotropium  18 mcg Inhalation Daily  . traMADol  50 mg Oral BID  . traZODone  50 mg Oral QHS  . [  START ON 06/24/2017] Vitamin D (Ergocalciferol)  50,000 Units Oral Weekly   Continuous Infusions: . insulin (NOVOLIN-R) infusion Stopped (06/22/17 0457)    LOS: 2 days    Kerney Elbe, DO [facility] Pager [phone]  If 7PM-7AM, please contact night-coverage [email or web address] Password [facility] 06/23/2017, 7:57 AM

## 2017-06-23 NOTE — Consult Note (Signed)
   [facility] CM Inpatient Consult   06/23/2017  TAYLAN MAYHAN [birth year] [id number]  REferral received from the Diabetes Coordinator.  Patient's Hgb A1C > 15.  Patient was assessed for [facility] for community services. Patient was previously active with [facility].  Met with patient at bedside regarding being restarted with [facility] services. Patient states she will no longer be at the [facility] and needs assistance with finding an apartment.  Speech was garbled at times.  She was talking about her son trying to put her into a "crazy house" so that he can get her check that comes monthly. Patient having periods of forgetfulness during the conversations. States,"I forgot my thought but anyway my kids can't put me away and they can't get my check."   Will follow for disposition and progress to assist with determining her post hospital needs.  Active consent form already on file, verified daughter's information as contact, [place],  and brochure with 24 hour nurse advise line with [facility] information given.  Of note, [facility] services does not replace or interfere with any services that are arranged by inpatient case management or social work. For additional questions or referrals please contact:   Natividad Brood, RN BSN [facility] Liaison  [phone] business mobile phone Toll free office [phone]

## 2017-06-23 NOTE — Consult Note (Signed)
Name: [name] MRN: [id number] DOB: September 16, 1966    ADMISSION DATE:  06/21/2017 CONSULTATION DATE:  06/23/17  REFERRING MD :  Alfredia Ferguson  CHIEF COMPLAINT:  SOB   HISTORY OF PRESENT ILLNESS:  [name] is a 51 y.o. female with a PMH as outlined below including but not limited to DCM (EF 20-25%), cocaine abuse, tobacco abuse, COPD, DM, CKD, HTN, HTN, HLD, medical non-compliance.  She presented to [facility] ED 8/27 with complaints that she couldn't walk as her legs were not holding up.  She apparently had been off of her medications for 2 months.  Lives alone in a motel.  She apparently slid to the ground on evening of 8/26 and landed on ground in cross legged position and was unable to get up.  Family came and checked on her 12 hours later aned found her in same position.    She was found to have HNP at L3/4 on the left for which she was evaluated by neurosurgery.  Plans were to await further workup per primary team.  Ultimately, she would need lumbar discectomy and decompression.  On 8/28, she complained of SOB.  CXR showed some edema for which she was diuresed almost 2L.  Repeat CXR 8/29 was much improved; however, pt remained on supplemental O2 and later that afternoon had SpO2 drop to 80's.  PCCM called evening of 8/29 for further evaluation. ABG was obtained that same evening and was normal (7.46 / 33 / 136).  Prior to PCCM being called, pt also with episode of change in mental status.  More somnolent and confused.  Of note, nursing informs me that pt had two visitors in room earlier and prior to visitors coming, pt had completely normal mental status.  Unsure if any possibility of whether pt had taken a non-prescribed med / drug.  PAST MEDICAL HISTORY :   has a past medical history of Alcohol use; Ankle fracture, lateral malleolus, closed (12/30/2011); Anxiety; Breast mass, left (12/15/2011); CKD (chronic kidney disease); COPD (chronic obstructive pulmonary disease) ([place]); Diabetes mellitus,  type 2 ([place]); Diabetic Charcot foot ([place]) (12/30/2011); Essential hypertension; History of GI bleed (12/05/2009); Hyperlipidemia; Hypertensive cardiomyopathy ([place]) (11/08/2012); Noncompliance with medication regimen; Obesity; Panic attacks; and Tobacco abuse.  has a past surgical history that includes Dilation and curettage of uterus; Breast biopsy; and Cardiac catheterization (N/A, 07/28/2016). Prior to Admission medications   Medication Sig Start Date End Date Taking? Authorizing Provider  amLODipine (NORVASC) 10 MG tablet TAKE 1 TABLET BY MOUTH ONCE A DAY. 02/01/17  Yes [name], MD  benazepril (LOTENSIN) 40 MG tablet TAKE 1 TABLET BY MOUTH ONCE A DAY. 02/01/17  Yes [name], MD  bisoprolol (ZEBETA) 5 MG tablet Take 1 tablet (5 mg total) by mouth daily. 04/26/17  Yes [name], MD  Choline Fenofibrate 135 MG capsule TAKE 1 CAPSULE BY MOUTH EVERY DAY. 06/01/17  Yes [name], MD  cloNIDine (CATAPRES) 0.2 MG tablet TAKE (1) TABLET BY MOUTH AT BEDTIME FOR BLOOD PRESSURE. 11/30/16  Yes [name], MD  clopidogrel (PLAVIX) 75 MG tablet TAKE 1 TABLET BY MOUTH DAILY WITH BREAKFAST. 06/01/17  Yes [name], MD  ezetimibe (ZETIA) 10 MG tablet Take 1 tablet (10 mg total) by mouth daily. 04/26/17  Yes [name], MD  FLUoxetine (PROZAC) 20 MG capsule TAKE 1 CAPSULE BY MOUTH EVERY DAY. 06/01/17  Yes [name], MD  furosemide (LASIX) 20 MG tablet TAKE 1 TABLET BY MOUTH DAILY.  05/31/17  Yes Herminio Commons, MD  gabapentin (NEURONTIN) 100 MG capsule TAKE 1 CAPSULE BY MOUTH THREE TIMES A DAY. 06/01/17  Yes [name], MD  hydrOXYzine (ATARAX/VISTARIL) 50 MG tablet TAKE (1) TABLET BY MOUTH AT BEDTIME. 06/01/17  Yes [name], MD  montelukast (SINGULAIR) 10 MG tablet TAKE (1) TABLET BY MOUTH AT BEDTIME. 06/01/17  Yes [name], MD  pantoprazole (PROTONIX) 40 MG tablet Take 1 tablet (40 mg total) by mouth daily. 04/26/17  Yes  [name], MD  potassium chloride SA (K-DUR,KLOR-CON) 20 MEQ tablet TAKE 1&1/2 TABLET BY MOUTH DAILY. 06/01/17  Yes [name], MD  PROAIR HFA 108 [phone] Base) MCG/ACT inhaler INHALE 2 PUFFS EVERY 6 HOURS AS NEEDED FOR SHORTNESS OF BREATH/WHEEZING. 06/01/17  Yes [name], MD  repaglinide (PRANDIN) 1 MG tablet TAKE 1 TABLET BY MOUTH 3 TIMES DAILY BEFORE MEALS. 11/30/16  Yes [name], MD  rosuvastatin (CRESTOR) 40 MG tablet TAKE 1 TABLET BY MOUTH ONCE A DAY. 06/01/17  Yes [name], MD  SPIRIVA HANDIHALER 18 MCG inhalation capsule Place 18 mcg into inhaler and inhale daily.  05/13/16  Yes [provider]  spironolactone (ALDACTONE) 25 MG tablet TAKE 1 TABLET BY MOUTH TWICE A DAY. 05/31/17  Yes Herminio Commons, MD  TRADJENTA 5 MG TABS tablet TAKE 1 TABLET BY MOUTH ONCE A DAY. 06/01/17  Yes [name], MD  traZODone (DESYREL) 50 MG tablet TAKE (1) TABLET BY MOUTH AT BEDTIME. 06/01/17  Yes [name], MD  Vitamin D, Ergocalciferol, (DRISDOL) 50000 units CAPS capsule Take 1 capsule (50,000 Units total) by mouth once a week. 03/02/17  Yes [name], MD  azelastine (ASTELIN) 0.1 % nasal spray Place 2 sprays into both nostrils 2 (two) times daily. Use in each nostril as directed 04/02/17   [name], MD  fluticasone [facility]) 50 MCG/ACT nasal spray Place 2 sprays into both nostrils daily. 02/08/17   [name], MD  ipratropium-albuterol (DUONEB) 0.5-2.5 (3) MG/3ML SOLN Take 3 mLs by nebulization every 6 (six) hours as needed. Patient taking differently: Take 3 mLs by nebulization every 6 (six) hours as needed (for wheezing/shortness of breath).  03/10/16   [name], MD   No Known Allergies  FAMILY HISTORY:  family history includes Arthritis in her unknown relative; Asthma in her unknown relative; Cancer in her sister and unknown relative; Diabetes in her unknown relative; Heart attack in her mother;  Hypertension in her brother, daughter, father, mother, sister, sister, and son. SOCIAL HISTORY:  reports that she has been smoking Cigarettes.  She has a 20.00 pack-year smoking history. She has never used smokeless tobacco. She reports that she uses drugs, including Marijuana and Cocaine, about 3 times per week. She reports that she does not drink alcohol.  REVIEW OF SYSTEMS:   All negative; except for those that are bolded, which indicate positives.  Constitutional: weight loss, weight gain, night sweats, fevers, chills, fatigue, weakness.  HEENT: headaches, sore throat, sneezing, nasal congestion, post nasal drip, difficulty swallowing, tooth/dental problems, visual complaints, visual changes, ear aches. Neuro: difficulty with speech, weakness, numbness, ataxia. CV:  chest pain, orthopnea, PND, swelling in lower extremities, dizziness, palpitations, syncope.  Resp: cough, hemoptysis, dyspnea, wheezing. GI: heartburn, indigestion, abdominal pain, nausea, vomiting, diarrhea, constipation, change in bowel habits, loss of appetite, hematemesis, melena, hematochezia.  GU: dysuria, change in color of urine, urgency or frequency, flank pain, hematuria. MSK: joint pain  or swelling, decreased range of motion. Psych: change in mood or affect, depression, anxiety, suicidal ideations, homicidal ideations. Skin: rash, itching, bruising.    SUBJECTIVE:  Denies any dyspnea or pain.  States "I'm fine".   VITAL SIGNS: Temp:  [98.1 F (36.7 C)-98.6 F (37 C)] 98.4 F (36.9 C) (08/29 1414) Pulse Rate:  [74-84] 84 (08/29 1707) Resp:  [18-20] 18 (08/29 0800) BP: (71-139)/(50-98) 86/58 (08/29 1734) SpO2:  [99 %-100 %] 100 % (08/29 1414) Weight:  [82.1 kg (181 lb)] 82.1 kg (181 lb) (08/29 0800)  PHYSICAL EXAMINATION: General: Adult female, no distress. Neuro: A&O x 3.  Agitated. HEENT: [place] / AT. MM dry. Cardiovascular: RRR, no M/R/G. Lungs: CTAB. Abdomen: BS x 4, S/NT/ND. Musculoskeletal: No  deformities, no edema. Skin: Warm, dry.   Recent Labs Lab 06/21/17 1637 06/22/17 0337 06/23/17 0631  NA 134* 140 140  K 2.9* 3.2* 4.1  CL 96* 107 107  CO2 27 26 23   BUN 23* 20 18  CREATININE 1.47* 1.36* 1.39*  GLUCOSE 629* 107* 202*    Recent Labs Lab 06/21/17 1637 06/22/17 0337 06/23/17 0631  HGB 14.1 13.6 13.2  HCT 41.4 41.5 40.9  WBC 7.4 8.1 11.2*  PLT 194 194 186   Ct Head Wo Contrast  Result Date: 06/22/2017 CLINICAL DATA:  Left lower extremity weakness beginning yesterday. EXAM: CT HEAD WITHOUT CONTRAST TECHNIQUE: Contiguous axial images were obtained from the base of the skull through the vertex without intravenous contrast. COMPARISON:  04/03/2015 FINDINGS: Brain: Premature chronic small vessel ischemia with remote lacunar infarcts in the bilateral thalamus and left caudate head. Small remote bilateral cerebellar infarcts. Ischemic gliosis in the cerebral white matter. No evidence of acute infarct, hemorrhage, hydrocephalus, or masslike findings. Vascular: Vertebrobasilar tortuosity. Atherosclerotic calcification. No hyperdense vessel. Skull: No acute or aggressive finding Sinuses/Orbits: Remote blowout fracture of the medial wall left orbit. IMPRESSION: 1. No acute finding. 2. Premature chronic small vessel ischemia with remote lacunar infarcts, as described. Electronically Signed   By: Monte Fantasia M.D.   On: 06/22/2017 07:22   Dg Chest Port 1 View  Result Date: 06/23/2017 CLINICAL DATA:  Shortness of breath EXAM: PORTABLE CHEST 1 VIEW COMPARISON:  06/21/2017, 05/25/2016 FINDINGS: Moderate cardiomegaly. No overt pulmonary edema. No pleural effusion or focal consolidation. No pneumothorax. Generous mediastinum, likely due to portable technique and patient rotation IMPRESSION: Moderate cardiomegaly.  No edema or infiltrate. Electronically Signed   By: Donavan Foil M.D.   On: 06/23/2017 18:12    STUDIES:  CXR 8/27 > vascular congestion. MRI L spine 8/27 > L3-4 and  L4-5 anterolisthesis, L5-S1 retrolisthesis.  No spondylolysis.  Degenerative changes of L spine with L3-4 disc extrusion.  Severe canal stenosis L3-4 with disorganized cauda equina, mild canal stenosis L4-5.  Severe neural foraminal narrowing L3-4 through L5-S1.  Progressive scalloping L3 posterior verterbral body suggesting chronic venous changes. CT head 8/28 > no acute process. Remote lacunar infarcts. CT head 8/29 >  CTA chest 8/29 >   SIGNIFICANT EVENTS  8/27 > admit. 8/29 > PCCM consult for hypoxia.  ASSESSMENT / PLAN:  COPD - has hx non-compliance with meds. Dyspnea - PCCM initially called evening of 8/29 for hypoxia; however, ABG reassuring (pO2 136) and does not demonstrate hypoxia. Tobacco abuse. Plan: Drop supplemental O2 from 5L down, accept SpO2 > 90%. Continue diuresis per primary team. Continue BD's. Incentive spirometry / bronchial hygiene. Tobacco cessation counseling.  Rest of care per primary team.  PCCM will sign off.  Please  do not hesitate to call us back if we can be of any further assistance.   Montey Hora, [facility] Pager: [phone]  or [phone] 06/23/2017, 7:02 PM

## 2017-06-23 NOTE — Significant Event (Signed)
Rapid Response Event Note  Overview: Time Called: 1730 Arrival Time: 7001 Event Type: Neurologic, Respiratory  Initial Focused Assessment: Patient lying in bed.  Intermittently fully alert and conversant and then sleeping and not interacting.  Occ increased WOB but then even regular breaths.  Lung sounds decreased bases BP 86/58  & 100/60  SR 89  RR 18  O2 sats difficult to obtain  Per staff was 70% on RA placed on 6L [place]  Interventions: 500 cc NS bolus ABG done 0.4 mg narcan given, no change in MS PCXR, CT head, CTA chest done  Family at bedside when returned from CT scan  BP 105/85  SR 92  RR 18  RA O2 sats 98%  Plan transfer to SDU RN to call if assistance needed  Plan of Care (if not transferred):  Event Summary: Name of Physician Notified: Sheikh at bedside at      at    Outcome: Transferred (Comment)     Raliegh Ip

## 2017-06-24 ENCOUNTER — Inpatient Hospital Stay (HOSPITAL_COMMUNITY): Payer: Medicare Other

## 2017-06-24 DIAGNOSIS — J9601 Acute respiratory failure with hypoxia: Secondary | ICD-10-CM

## 2017-06-24 DIAGNOSIS — I749 Embolism and thrombosis of unspecified artery: Secondary | ICD-10-CM

## 2017-06-24 DIAGNOSIS — R0681 Apnea, not elsewhere classified: Secondary | ICD-10-CM

## 2017-06-24 DIAGNOSIS — I63119 Cerebral infarction due to embolism of unspecified vertebral artery: Secondary | ICD-10-CM

## 2017-06-24 LAB — LIPID PANEL
Cholesterol: 164 mg/dL (ref 0–200)
HDL: 51 mg/dL (ref >40)
LDL Cholesterol: 96 mg/dL (ref 0–99)
Total CHOL/HDL Ratio: 3.2 RATIO
Triglycerides: 87 mg/dL (ref <150)
VLDL: 17 mg/dL (ref 0–40)

## 2017-06-24 LAB — BASIC METABOLIC PANEL
Anion gap: 12 (ref 5–15)
BUN: 32 mg/dL — ABNORMAL HIGH (ref 6–20)
CO2: 19 mmol/L — ABNORMAL LOW (ref 22–32)
Calcium: 8.8 mg/dL — ABNORMAL LOW (ref 8.9–10.3)
Chloride: 109 mmol/L (ref 101–111)
Creatinine, Ser: 2.04 mg/dL — ABNORMAL HIGH (ref 0.44–1.00)
GFR calc Af Amer: 31 mL/min — ABNORMAL LOW (ref >60)
GFR calc non Af Amer: 27 mL/min — ABNORMAL LOW (ref >60)
Glucose, Bld: 71 mg/dL (ref 65–99)
Potassium: 6 mmol/L — ABNORMAL HIGH (ref 3.5–5.1)
Sodium: 140 mmol/L (ref 135–145)

## 2017-06-24 LAB — COMPREHENSIVE METABOLIC PANEL
ALT: 23 U/L (ref 14–54)
AST: 21 U/L (ref 15–41)
Albumin: 2.2 g/dL — ABNORMAL LOW (ref 3.5–5.0)
Alkaline Phosphatase: 154 U/L — ABNORMAL HIGH (ref 38–126)
Anion gap: 9 (ref 5–15)
BUN: 26 mg/dL — ABNORMAL HIGH (ref 6–20)
CO2: 23 mmol/L (ref 22–32)
Calcium: 8.5 mg/dL — ABNORMAL LOW (ref 8.9–10.3)
Chloride: 108 mmol/L (ref 101–111)
Creatinine, Ser: 1.89 mg/dL — ABNORMAL HIGH (ref 0.44–1.00)
GFR calc Af Amer: 34 mL/min — ABNORMAL LOW (ref >60)
GFR calc non Af Amer: 29 mL/min — ABNORMAL LOW (ref >60)
Glucose, Bld: 191 mg/dL — ABNORMAL HIGH (ref 65–99)
Potassium: 5 mmol/L (ref 3.5–5.1)
Sodium: 140 mmol/L (ref 135–145)
Total Bilirubin: 1.3 mg/dL — ABNORMAL HIGH (ref 0.3–1.2)
Total Protein: 5.3 g/dL — ABNORMAL LOW (ref 6.5–8.1)

## 2017-06-24 LAB — CBC WITH DIFFERENTIAL/PLATELET
Basophils Absolute: 0 10*3/uL (ref 0.0–0.1)
Basophils Relative: 0 %
Eosinophils Absolute: 0 10*3/uL (ref 0.0–0.7)
Eosinophils Relative: 0 %
HCT: 42.7 % (ref 36.0–46.0)
Hemoglobin: 13.6 g/dL (ref 12.0–15.0)
Lymphocytes Relative: 8 %
Lymphs Abs: 1.5 10*3/uL (ref 0.7–4.0)
MCH: 31.3 pg (ref 26.0–34.0)
MCHC: 31.9 g/dL (ref 30.0–36.0)
MCV: 98.2 fL (ref 78.0–100.0)
Monocytes Absolute: 1 10*3/uL (ref 0.1–1.0)
Monocytes Relative: 6 %
Neutro Abs: 15.4 10*3/uL — ABNORMAL HIGH (ref 1.7–7.7)
Neutrophils Relative %: 86 %
Platelets: 213 10*3/uL (ref 150–400)
RBC: 4.35 MIL/uL (ref 3.87–5.11)
RDW: 14.8 % (ref 11.5–15.5)
WBC: 18 10*3/uL — ABNORMAL HIGH (ref 4.0–10.5)

## 2017-06-24 LAB — LACTIC ACID, PLASMA: Lactic Acid, Venous: 4.3 mmol/L (ref 0.5–1.9)

## 2017-06-24 LAB — PHOSPHORUS: Phosphorus: 3 mg/dL (ref 2.5–4.6)

## 2017-06-24 LAB — PROCALCITONIN: Procalcitonin: 0.48 ng/mL

## 2017-06-24 LAB — GLUCOSE, CAPILLARY
Glucose-Capillary: 157 mg/dL — ABNORMAL HIGH (ref 65–99)
Glucose-Capillary: 182 mg/dL — ABNORMAL HIGH (ref 65–99)
Glucose-Capillary: 63 mg/dL — ABNORMAL LOW (ref 65–99)
Glucose-Capillary: 81 mg/dL (ref 65–99)
Glucose-Capillary: 86 mg/dL (ref 65–99)

## 2017-06-24 LAB — AMMONIA: Ammonia: 63 umol/L — ABNORMAL HIGH (ref 9–35)

## 2017-06-24 LAB — MRSA PCR SCREENING: MRSA by PCR: NEGATIVE

## 2017-06-24 LAB — MAGNESIUM: Magnesium: 1.8 mg/dL (ref 1.7–2.4)

## 2017-06-24 MED ORDER — DEXTROSE 50 % IV SOLN
25.0000 mL | Freq: Once | INTRAVENOUS | Status: AC
  Administered 2017-06-24: 25 mL via INTRAVENOUS

## 2017-06-24 MED ORDER — SODIUM CHLORIDE 0.9 % IV BOLUS (SEPSIS)
250.0000 mL | Freq: Once | INTRAVENOUS | Status: AC
  Administered 2017-06-24: 250 mL via INTRAVENOUS

## 2017-06-24 MED ORDER — SODIUM CHLORIDE 0.9 % IV BOLUS (SEPSIS): 250.0000 mL | Freq: Once | INTRAVENOUS | Status: AC

## 2017-06-24 MED ORDER — SODIUM POLYSTYRENE SULFONATE 15 GM/60ML PO SUSP
30.0000 g | Freq: Once | ORAL | Status: AC
  Administered 2017-06-24: 30 g via ORAL
  Filled 2017-06-24: qty 120

## 2017-06-24 MED ORDER — INSULIN ASPART 100 UNIT/ML ~~LOC~~ SOLN
2.0000 [IU] | SUBCUTANEOUS | Status: DC
  Administered 2017-06-25: 4 [IU] via SUBCUTANEOUS
  Administered 2017-06-25: 2 [IU] via SUBCUTANEOUS

## 2017-06-24 MED ORDER — SODIUM CHLORIDE 0.9 % IV SOLN
INTRAVENOUS | Status: DC
  Administered 2017-06-24: 20:00:00 via INTRAVENOUS

## 2017-06-24 MED ORDER — DEXTROSE 5 % IV SOLN
2.0000 g | INTRAVENOUS | Status: DC
  Administered 2017-06-24 – 2017-06-30 (×6): 2 g via INTRAVENOUS
  Filled 2017-06-24 (×10): qty 2

## 2017-06-24 MED ORDER — STROKE: EARLY STAGES OF RECOVERY BOOK
Freq: Once | Status: DC
  Filled 2017-06-24: qty 1

## 2017-06-24 MED ORDER — IPRATROPIUM-ALBUTEROL 0.5-2.5 (3) MG/3ML IN SOLN
3.0000 mL | Freq: Three times a day (TID) | RESPIRATORY_TRACT | Status: DC
  Administered 2017-06-25 – 2017-06-28 (×9): 3 mL via RESPIRATORY_TRACT
  Filled 2017-06-24 (×12): qty 3

## 2017-06-24 MED ORDER — DEXTROSE 50 % IV SOLN
INTRAVENOUS | Status: AC
  Filled 2017-06-24: qty 50

## 2017-06-24 MED ORDER — SODIUM CHLORIDE 0.9 % IV SOLN: INTRAVENOUS | Status: DC

## 2017-06-24 MED ORDER — NALOXONE HCL 0.4 MG/ML IJ SOLN
INTRAMUSCULAR | Status: AC
  Filled 2017-06-24: qty 1

## 2017-06-24 NOTE — Progress Notes (Signed)
RT took patient off of ventilator and placed on 2L [place]. Patient is tolerating well at this time with no respiratory distress noted. RT will monitor as needed.

## 2017-06-24 NOTE — Progress Notes (Signed)
Pharmacy Antibiotic Note  [name] is a 51 y.o. female admitted on 06/21/2017 with weakness, now w/ concern for sepsis.  Pharmacy has been consulted for cefepime dosing.  Plan: Cefepime 2g IV Q24H.  Height: 5\' 1"  (154.9 cm) Weight: 153 lb 4.8 oz (69.5 kg) IBW/kg (Calculated) : 47.8  Temp (24hrs), Avg:98.3 F (36.8 C), Min:97.9 F (36.6 C), Max:98.5 F (36.9 C)   Recent Labs Lab 06/21/17 1637 06/22/17 0337 06/23/17 0631 06/24/17 0248  WBC 7.4 8.1 11.2* 18.0*  CREATININE 1.47* 1.36* 1.39* 1.89*    Estimated Creatinine Clearance: 30.7 mL/min (A) (by C-G formula based on SCr of 1.89 mg/dL (H)).    No Known Allergies   Thank you for allowing pharmacy to be a part of this patient's care.  Wynona Neat, PharmD, BCPS  06/24/2017 7:47 AM

## 2017-06-24 NOTE — Progress Notes (Signed)
250cc bolus given. BP 94/64 sats 100% on 2 L [place]. Patient still drowsy, but arouses easily to voice. Will continue to monitor patient.

## 2017-06-24 NOTE — Code Documentation (Signed)
Responded to 3M01 following notification from Dr. Leonel Ramsay with neurology.  Patient with change in neurological status from baseline.  Patient LKW at 1000 per neurologist and not a candidate for tPA.  Patient in the process of being transported to STAT MRI.  Patient transported to MRI with stroke RN, bedside RN and neurologist.  Initial MRI images completed along with MRA.  Patient is not an endovascular intervention candidate per MD.  Mosetta Anis with bedside RN Urban Gibson.

## 2017-06-24 NOTE — Progress Notes (Signed)
[place] Progress Note Patient Name: [name] DOB: 09-25-1966 MRN: [id number]   Date of Service  06/24/2017  HPI/Events of Note  Contacted by attending hospitalist given altered mentation. Patient now having periods of apnea and hypoxia. ABG from earlier reviewed demonstrating no hypoventilation and no significant hypoxia. Camera shows neurologist at bedside. Nursing staff at bedside as well. Patient did have family members visiting today but reportedly none present during unsupervised period. Patient awakening to noxious stimuli. Appears drowsy. May require transfer to ICU.   eICU Interventions  1. Placing BiPAP for respiratory support 2. Intensivist physician extender to assess patient at bedside 3. Consider repeat urine drug screen & IV Narcan      Intervention Category Major Interventions: Respiratory failure - evaluation and management;Delirium, psychosis, severe agitation - evaluation and management  Tera Partridge 06/24/2017, 6:34 PM

## 2017-06-24 NOTE — Progress Notes (Signed)
[place] Progress Note Patient Name: [name] DOB: 1966/08/15 MRN: [id number]   Date of Service  06/24/2017  HPI/Events of Note  Notified by bedside nurse of elevated lactic acid & hyperkalemia with potassium 6.0. Creatinine is slightly worse at 2.0. Patient remains awake. Blood pressure still fluctuating somewhat.   eICU Interventions  1. Repeat lactic acid shortly after midnight 2. Kayexalate 30 g by mouth 1 3. Repeat likely panel in a.m. 4. Continuous telemetry monitoring 5. Awaiting urine drug screen 6. Close ICU monitoring      Intervention Category Major Interventions: Electrolyte abnormality - evaluation and management;Acid-Base disturbance - evaluation and management  Tera Partridge 06/24/2017, 10:54 PM

## 2017-06-24 NOTE — Progress Notes (Signed)
Patient BP dropped to 85/57, patient drowsy and hard to arouse. O2 sats 100% on 2L. MD Alfredia Ferguson notified orders received will continue to monitor patient.

## 2017-06-24 NOTE — Progress Notes (Signed)
Patient transported to MRI this RN accompanied patient.

## 2017-06-24 NOTE — Progress Notes (Signed)
[place] Progress Note Patient Name: [name] DOB: 05/24/1966 MRN: [id number]   Date of Service  06/24/2017  HPI/Events of Note  [name] now shows patient with increased level of alertness. Answering questions through BiPAP. Patient refusing medications and interventions. Blood pressure unreliable as patient is conversant but mean arterial pressure in the lower 50s. Now blood pressure reading 100/88. Bedside nurse reports she is unable to obtain a manual blood pressure. It's unclear whether or not the patient has yet to receive the dose of Narcan ordered.   eICU Interventions  1. Bedside nurse to confirm with transfer nurse whether or not Narcan has been administered 2. Normal saline 250 mL over 30 minutes now 3. Intensivist to assess patient at bedside      Intervention Category Major Interventions: Delirium, psychosis, severe agitation - evaluation and management  Tera Partridge 06/24/2017, 7:51 PM

## 2017-06-24 NOTE — Progress Notes (Signed)
PROGRESS NOTE    [name]  ASN:[id number] DOB: 04-04-1966 DOA: 06/21/2017 PCP: [name], MD   Brief Narrative:  [name] a 51 y.o.femalewith medical history significant of dilated cardiomyopathy with previous ejection fraction of 20-25% now 30-35%, diabetes mellitus type 2, uncontrolled with chronic kidney disease stage III, uncontrolled hypertension, cocaine abuse, tobacco abuse, chronic obstructive pulmonary disease, hyperlipidemia, and medical noncompliance who presents the emergency department stating that she can no longer walk her legs won't hold her up. Patient states that she has not been taking any of her medications for her heart in 2 months. She stopped her diabetes medications 2 weeks ago. She lives by herself in a motel. Home health was coming to help her until the home health company went out of business. She is usually able to walk over the last couple of days she's become increasingly weak late last night or very early this morning she was unable to get up off the commode because of bilateral leg weakness. She slid to the ground and crawled out to the main area of the motel room where she remained in a crosslegged sitting pattern until family came approximately 12 hours later. She didn't call 911 because she did not want to come to the hospital.   Ultimately when family found her and they convinced her come into the emergency department. She has complaints of increased swelling in her legs (not taking her Lasix) and shortness of breath. She feels generally ill which has been progressive and now become severe. She denies any fevers. She has had poor urine output recently not making much urine at all. She has increased thirst. She has no chest pain she denies palpitations. She says she has serious chronic low back pain and this has not worsened but the weakness is severe. Also for past few days she has been having intermittent myoclonic jerking which lasted  about 30 seconds and resolves. She has no other shaking. He has made her symptoms better nothing has made them worse. Symptoms have been chronic and worsening since they developed. She was admitted for Elevated Troponin, CHF Exacerbation, and Back Pain with inability to walk.   On the afternoon of 06/23/17 a Rapid Response was called as the patient became Hypoxic and desaturated to 79% on 6 Liters. Patient was also Hypotensive and confused. She was bolused 250 mL of NS and had her Antihypertensives stopped. Saturations eventually came up.  An ABG was obtained which was unremarkable, a Stat Portable CXR which was normal, and patient was also given 1x of Narcan. Patient was placed on CIWA protocol, ordered a Head CT Scan for her confusion/sleepiness and was also ordered a CTA Chest to r/o PE (reduced Contrast dose). PCCM was consulted to evaluate for patient's Hypoxia episode and patient was transferred to SDU.  This AM patient was doing well and this afternoon nurse paged stating the patient became confused again and dropped her BP. Patient's BP improved after a bolus was ordered and ABG done was unremarkable. Because the patient's mentation had changed I went to the bedside and it was very difficult to arouse her so I ordered an EEG and MRI of the Head. Neurology was consulted and evaluated and took the patient down for a STAT MRI and patient was found to have multiple strokes likely embolic in nature. After the patient returned from MRI she started having periods of apnea with [place]-Stokes Breathing. Neurology was present at bedside with me and felt that the change  in breathing was unrelated to the CVA's. I called PCCM eLink Dr. Ashok Cordia who recommended the patient be placed on BiPAP and given Narcan. PCCM came to evaluate the patient and because of patient's tenuous respiratory status was transferred to ICU to [facility] service.   Assessment & Plan:   Principal Problem:   Elevated troponin Active Problems:    Medical non-compliance   Hypokalemia   Acute respiratory failure with hypoxia (HCC)   CKD (chronic kidney disease) stage 3, GFR 30-59 ml/min   Polysubstance abuse (including cocaine)   Cigarette nicotine dependence with nicotine-induced disorder   Hyperlipidemia LDL goal <70   Cardiomyopathy-etiology not yet determined (suspect hypertensive)   Severe left ventricular systolic dysfunction   Spinal stenosis of lumbar region   DM (diabetes mellitus), type 2, uncontrolled, with renal complications (HCC)   Acute systolic (congestive) heart failure ([place])  Hypoxia Episode with Confusion/AMS and Hypotension and periods of Apnea -Desaturated to 79% on 6 Liters and eventually came back to the 90's after being confused and hypotensive on 8/29 -Became Altered again and hypotensive -ABG showed 7.432/30.5/104/20.0/98.6% on 2 Liters -Bolused 250 mL of NS again -Not on Home O2; C/w supplemental O2 as needed and may need BiPAP -Obtained Head CT w/o Contrast on 06/23/17 and showed No acute intracranial abnormality identified. Mild chronic microvascular ischemic changes and mild parenchymal volume loss of the brain. Small chronic lacunar infarcts in left thalamus and left caudate head. -Obtained CTA of Chest to r/o PE (reduced dose) and showed No evidence of pulmonary embolism. Patchy infiltrates in RIGHT lung question pneumonia versus asymmetric edema. Subsegmental atelectasis LEFT lower lobe. Enlargement of cardiac chambers with upper normal caliber of the ascending thoracic aorta. -Patient became confused and somnolent and had a difficult time rousing from sleep so EEG and MRI was ordered. Consulted Neurology Dr. Leonel Ramsay and took the patient down for a Stat Head MRI -Official MRI report pending but per Dr. Cecil Cobbs interpretation patient had embolic CVA's -Patient began having periods of Apnea so PCCM was re-consulted and Dr. Ashok Cordia advised BiPAP and Narcan -Held Antihypertensives including BB and  Losartan  -Started patient on scheduled Nebs -Given 1x of Narcan with no improvement and repeated -Placed on CIWA Protocol for ? EtOH Witdrawal; C/w MVI, Folic Acid, and Thiamine -PCCM Consulted for further evaluation and Recommendations and patient to be transferred to the ICU under [place] MRI Results pending -Stroke order Set utilized -Discussed case with Neurology Dr. Leonel Ramsay and Stroke Team to follow in AM  Elevated Troponin -Likely demand ischemia -Troponin 0.32, 0.46, 0.41 (relatively flat) -Denied chest pain -Cardiology consulted appreciated -Echocardiogram pending -Patient had heart cath 07/28/2016 which showed no apparent coronary artery disease, continue aggressive medical therapy and both pressure control -Patient currently on aspirin and Plavix  Acute systolic CHF exacerbation -BNP on admission 1152 -CXR on Admission showed mild pulmonary vascular congestion -Last echocardiogram 05/26/2016 showed an EF of 20-25%, restrictive physiology, decreased left ventricular diastolic compliance and or increased left atrial pressure -Repeat ECHO showed improved EF -Continue IV Lasix 40 mg Daily -Cardiology restarted BB and Losartan on 8/29 however patient became Hypotensive yesterday Afternoon and so BB and Losartan held and patient bolused 250 mL of NS -Will need to discuss with Cardiology about Resumption given her recent Hypotensive Episode; Cardiology recommending BP meds and add lower medications as tolerated -Monitor intake and output, daily weights  Back pain with spinal stenosis of the lumbar region -MRI showed L3-L4 canal stenosis of  disorganized cauda equina -Neurosurgery consultation appreciated and originally patient was supposed to go for Lumbar Discectomy/Decompression for Herniated Disc at Levels L3/4 but unclear whether she will go or not; Cardiology cleared patient -Continue pain control and decreased PRN Oxycodone  Frequency  Diabetes mellitus, type II, complicated by hyperglycemia -On arrival to emergency department, blood sugars over 600 -HbA1c >15.5 -Continue Novolog SSI and CBG monitoring -Patient uses Prandin and Tradjenta at home, will hold for now -Diabetic Education Coordinator Consulted  -BS during Apneic Episodes were 81  Hypokalemia, improved -Replete.Continue to monitor BMP  Leukocytosis, worsening -? Pneumonia  -WBC went from 8.1 -> 11.2 -> 18.0 -Started Abx with IV Cefepime -Continue to Monitor and repeat CMP in AM   Acute Kidney Injury on Chronic kidney disease, stage III -Creatinine currently 1.89 -Not making much Urine so foley was placed -IV Lasix Held -Continue to monitor BMP closely as patient will be diuresing and now that she received Contrast for CTA  Polysubstance Abuse -UDS positive for Cocaine -Cocaine Cessastion advised   Tobacco Abuse -Cessation discussed  Hyperlipidemia -Continue Statin, Fenofibrate  GERD -Continue PPI  COPD -Stable, continue home medications  Anxiety/Depression -Continue Prozac  DVT prophylaxis: Enoxaparin 40 mg sq q24h Code Status: FULL CODE Family Communication: Discussed with Sisters bedside Disposition Plan: Transfer to ICU to PCCM Service  Consultants:   Cardiology  Neurosurgery  PCCM   Procedures:  ECHOCARDIOGRAM Study Conclusions  - Left ventricle: The cavity size was normal. Wall thickness was   increased in a pattern of moderate LVH. Systolic function was   moderately to severely reduced. The estimated ejection fraction   was in the range of 30% to 35%. Diffuse hypokinesis. Features are   consistent with a pseudonormal left ventricular filling pattern,   with concomitant abnormal relaxation and increased filling   pressure (grade 2 diastolic dysfunction). - Mitral valve: There was moderate regurgitation directed   posteriorly. - Left atrium: The atrium was mildly dilated.   Antimicrobials:   Anti-infectives    Start     Dose/Rate Route Frequency Ordered Stop   06/24/17 0800  ceFEPIme (MAXIPIME) 2 g in dextrose 5 % 50 mL IVPB     2 g 100 mL/hr over 30 Minutes Intravenous Every 24 hours 06/24/17 0749       Subjective: Seen and examined at bedside this AM and was doing fine and had no complaints except the food was bad. This afternoon became unresponsive and MRI revealed acute CVA's. Patient started having apneic breathing so PCCM was re-consulted and patient will be transferred to the ICU.   Objective: Vitals:   06/24/17 1252 06/24/17 1420 06/24/17 1600 06/24/17 1852  BP: (!) 141/83  98/60 102/61  Pulse: 74  77 88  Resp: 14     Temp: (!) 97 F (36.1 C)  (!) 96.4 F (35.8 C)   TempSrc: Axillary  Axillary   SpO2:  100%  100%  Weight:      Height:        Intake/Output Summary (Last 24 hours) at 06/24/17 1919 Last data filed at 06/24/17 1600  Gross per 24 hour  Intake             1315 ml  Output              400 ml  Net              915 ml   Filed Weights   06/23/17 0800 06/23/17 2025 06/24/17 0642  Weight: 82.1 kg (  181 lb) 69.5 kg (153 lb 4.8 oz) 69.5 kg (153 lb 4.8 oz)   Examination: Physical Exam during Apneic spells:  Constitutional: WN/WD AAF who is very difficult to arouse with sternal rub and having periods of apnea that are worsening  Eyes: Pupils symmetric. Has right gaze preference  ENMT: Poor Dentition.  Neck: Supple with no JVD Respiratory: Has periods of apnea with [place]-Stokes Breathing; Diminished breath sounds Cardiovascular: RRR; no m/r/g; No lower extremity edema  Abdomen: Soft, NT, ND. Bowel sounds present x4 GU: Deferred Musculoskeletal: No contractures; Spontaneously moves extremities when responding to sternal rub  Skin: Warm and Dry; No rashes or lesions on a limited skin eval Neurologic: Difficult to arouse and  Psychiatric: Unable to Assess; Impaired judgement and insight   Data Reviewed: I have personally reviewed following  labs and imaging studies  CBC:  Recent Labs Lab 06/21/17 1637 06/22/17 0337 06/23/17 0631 06/24/17 0248  WBC 7.4 8.1 11.2* 18.0*  NEUTROABS  --   --   --  15.4*  HGB 14.1 13.6 13.2 13.6  HCT 41.4 41.5 40.9 42.7  MCV 95.0 96.7 97.1 98.2  PLT 194 194 186 322   Basic Metabolic Panel:  Recent Labs Lab 06/21/17 1637 06/22/17 0337 06/23/17 0631 06/24/17 0248  NA 134* 140 140 140  K 2.9* 3.2* 4.1 5.0  CL 96* 107 107 108  CO2 27 26 23 23   GLUCOSE 629* 107* 202* 191*  BUN 23* 20 18 26*  CREATININE 1.47* 1.36* 1.39* 1.89*  CALCIUM 9.1 8.2* 8.4* 8.5*  MG  --  1.9  --  1.8  PHOS  --   --   --  3.0   GFR: Estimated Creatinine Clearance: 30.7 mL/min (A) (by C-G formula based on SCr of 1.89 mg/dL (H)). Liver Function Tests:  Recent Labs Lab 06/21/17 1637 06/24/17 0248  AST 13* 21  ALT 31 23  ALKPHOS 178* 154*  BILITOT 1.2 1.3*  PROT 5.7* 5.3*  ALBUMIN 3.1* 2.2*   No results for input(s): LIPASE, AMYLASE in the last 168 hours. No results for input(s): AMMONIA in the last 168 hours. Coagulation Profile: No results for input(s): INR, PROTIME in the last 168 hours. Cardiac Enzymes:  Recent Labs Lab 06/21/17 1637 06/21/17 2129 06/22/17 0337 06/23/17 0631  CKTOTAL 63  --   --  40  CKMB  --   --   --  5.2*  TROPONINI 0.32* 0.46* 0.41*  --    BNP (last 3 results) No results for input(s): PROBNP in the last 8760 hours. HbA1C:  Recent Labs  06/21/17 2130 06/22/17 0808  HGBA1C >15.5* >15.5*   CBG:  Recent Labs Lab 06/23/17 2244 06/24/17 0903 06/24/17 1252 06/24/17 1627 06/24/17 1842  GLUCAP 201* 182* 157* 86 81   Lipid Profile: No results for input(s): CHOL, HDL, LDLCALC, TRIG, CHOLHDL, LDLDIRECT in the last 72 hours. Thyroid Function Tests: No results for input(s): TSH, T4TOTAL, FREET4, T3FREE, THYROIDAB in the last 72 hours. Anemia Panel: No results for input(s): VITAMINB12, FOLATE, FERRITIN, TIBC, IRON, RETICCTPCT in the last 72 hours. Sepsis  Labs:  Recent Labs Lab 06/24/17 0248  PROCALCITON 0.48    Recent Results (from the past 240 hour(s))  MRSA PCR Screening     Status: None   Collection Time: 06/23/17  8:23 PM  Result Value Ref Range Status   MRSA by PCR NEGATIVE NEGATIVE Final    Comment:        The GeneXpert MRSA Assay (FDA approved for NASAL  specimens only), is one component of a comprehensive MRSA colonization surveillance program. It is not intended to diagnose MRSA infection nor to guide or monitor treatment for MRSA infections.     Radiology Studies: Dg Chest 1 View  Result Date: 06/24/2017 CLINICAL DATA:  Shortness of breath, acute respiratory failure with hypoxia, chronic renal insufficiency, cardiomyopathy, COPD, current smoker. EXAM: CHEST 1 VIEW COMPARISON:  CT scan of the chest and chest x-ray of June 23, 2017 FINDINGS: The lungs are mildly hyperinflated despite the apical lordotic positioning. Hazy increased density in the right perihilar region persists. The retrocardiac region on the left exhibits increased density. There is no pleural effusion or pneumothorax. The cardiac silhouette is enlarged. The pulmonary vascularity is not engorged. The bony thorax is unremarkable. IMPRESSION: Hyperinflation consistent with COPD. Right perihilar and left retrocardiac interstitial density compatible with atelectasis or pneumonia. Cardiomegaly without significant pulmonary vascular congestion or pulmonary edema. No pleural effusion. Electronically Signed   By: David  Martinique M.D.   On: 06/24/2017 07:48   Ct Head Wo Contrast  Result Date: 06/23/2017 CLINICAL DATA:  51 y/o F; left lower extremity weakness beginning yesterday. EXAM: CT HEAD WITHOUT CONTRAST TECHNIQUE: Contiguous axial images were obtained from the base of the skull through the vertex without intravenous contrast. COMPARISON:  06/22/2017 CT of the head. FINDINGS: Brain: No evidence of acute infarction, hemorrhage, hydrocephalus, extra-axial collection  or mass lesion/mass effect. Stable chronic lacunar infarcts within the left thalamus and left caudate head. Stable mild chronic microvascular ischemic changes of white matter parenchymal volume loss of the brain. Vascular: Mild calcific atherosclerosis of carotid siphons. No hyperdense vessel identified. Skull: Normal. Negative for fracture or focal lesion. Sinuses/Orbits: Chronic left lamina papyracea see a fracture deformity with herniation of extraconal fat. Opacification of left sphenoid sinus. Otherwise negative. Other: None. IMPRESSION: 1. No acute intracranial abnormality identified. 2. Mild chronic microvascular ischemic changes and mild parenchymal volume loss of the brain. Small chronic lacunar infarcts in left thalamus and left caudate head. Electronically Signed   By: Kristine Garbe M.D.   On: 06/23/2017 19:20   Ct Angio Chest Pe W Or Wo Contrast  Result Date: 06/23/2017 CLINICAL DATA:  Dyspnea, question pulmonary embolism, history type II diabetes mellitus, smoking, COPD, essential hypertension, chronic kidney disease, hypertensive cardiomyopathy EXAM: CT ANGIOGRAPHY CHEST WITH CONTRAST TECHNIQUE: Multidetector CT imaging of the chest was performed using the standard protocol during bolus administration of intravenous contrast. Multiplanar CT image reconstructions and MIPs were obtained to evaluate the vascular anatomy. CONTRAST:  80 cc Isovue 370 IV COMPARISON:  CT chest 11/08/2012 FINDINGS: Cardiovascular: Motion artifacts limit assessment of the ascending aorta. Ascending aorta appears upper normal caliber without aneurysm. Suboptimal assessment for dissection due to lack of adequate aortic enhancement. Pulmonary arteries well opacified with mild dilatation of the main pulmonary artery 3.5 cm diameter. No evidence of pulmonary embolism. No pericardial effusion. Mediastinum/Nodes: Esophagus unremarkable. Base of cervical region normal appearance. No thoracic adenopathy. Lungs/Pleura:  Patchy infiltrates throughout RIGHT upper and RIGHT lower lobes, minimally in RIGHT middle lobe question pneumonia. Subsegmental atelectasis in the anterior portion of the LEFT lower lobe adjacent to the basilar portion of the major fissure. No pleural effusion or pneumothorax. Upper Abdomen: BILATERAL adrenal gland thickening without discrete nodule. Remaining visualized upper abdomen unremarkable. Musculoskeletal: No acute osseous findings. Review of the MIP images confirms the above findings. IMPRESSION: No evidence of pulmonary embolism. Patchy infiltrates in RIGHT lung question pneumonia versus asymmetric edema. Subsegmental atelectasis LEFT lower lobe. Enlargement of  cardiac chambers with upper normal caliber of the ascending thoracic aorta. Electronically Signed   By: Lavonia Dana M.D.   On: 06/23/2017 19:12   Dg Chest Port 1 View  Result Date: 06/23/2017 CLINICAL DATA:  Shortness of breath EXAM: PORTABLE CHEST 1 VIEW COMPARISON:  06/21/2017, 05/25/2016 FINDINGS: Moderate cardiomegaly. No overt pulmonary edema. No pleural effusion or focal consolidation. No pneumothorax. Generous mediastinum, likely due to portable technique and patient rotation IMPRESSION: Moderate cardiomegaly.  No edema or infiltrate. Electronically Signed   By: Donavan Foil M.D.   On: 06/23/2017 18:12   Scheduled Meds: .  stroke: mapping our early stages of recovery book   Does not apply Once  . aspirin EC  81 mg Oral Daily  . clopidogrel  75 mg Oral Q breakfast  . cycloSPORINE  1 drop Both Eyes BID  . enoxaparin (LOVENOX) injection  40 mg Subcutaneous Q24H  . ezetimibe  10 mg Oral Daily  . fenofibrate  160 mg Oral Daily  . FLUoxetine  20 mg Oral Daily  . folic acid  1 mg Oral Daily  . gabapentin  100 mg Oral TID  . insulin aspart  2-6 Units Subcutaneous Q4H  . ipratropium-albuterol  3 mL Nebulization Q6H  . living well with diabetes book   Does not apply Once  . mometasone-formoterol  2 puff Inhalation BID  .  montelukast  10 mg Oral QHS  . multivitamin with minerals  1 tablet Oral Daily  . naloxone      . nicotine  7 mg Transdermal Daily  . pantoprazole  40 mg Oral Daily  . rosuvastatin  40 mg Oral Daily  . sodium chloride flush  3 mL Intravenous Q12H  . thiamine  100 mg Oral Q24H   Or  . thiamine injection  100 mg Intravenous Q24H  . traZODone  50 mg Oral QHS  . Vitamin D (Ergocalciferol)  50,000 Units Oral Weekly   Continuous Infusions: . sodium chloride    . ceFEPime (MAXIPIME) IV Stopped (06/24/17 8786)  . sodium chloride      LOS: 3 days    CRITICAL CARE TIME: 48 Minutes from 5:50pm - 6:38 pm  Kerney Elbe, DO [facility] Pager [phone]  If 7PM-7AM, please contact night-coverage [email or web address] Password [email or web address] 06/24/2017, 7:19 PM

## 2017-06-24 NOTE — Progress Notes (Signed)
Progress Note  Patient Name: [name] Date of Encounter: 06/24/2017  Primary Cardiologist: Dr. Bronson Ing  Subjective   Complains of mild dyspnea; no chest pain  Inpatient Medications    Scheduled Meds: . aspirin EC  81 mg Oral Daily  . clopidogrel  75 mg Oral Q breakfast  . cycloSPORINE  1 drop Both Eyes BID  . enoxaparin (LOVENOX) injection  40 mg Subcutaneous Q24H  . ezetimibe  10 mg Oral Daily  . fenofibrate  160 mg Oral Daily  . FLUoxetine  20 mg Oral Daily  . folic acid  1 mg Oral Daily  . gabapentin  100 mg Oral TID  . hydrOXYzine  50 mg Oral QHS  . insulin aspart  0-5 Units Subcutaneous QHS  . insulin aspart  0-9 Units Subcutaneous TID WC  . insulin aspart  3 Units Subcutaneous TID WC  . ipratropium-albuterol  3 mL Nebulization Q6H  . living well with diabetes book   Does not apply Once  . LORazepam  0-4 mg Intravenous Q6H   Followed by  . [START ON 06/25/2017] LORazepam  0-4 mg Intravenous Q12H  . mometasone-formoterol  2 puff Inhalation BID  . montelukast  10 mg Oral QHS  . multivitamin with minerals  1 tablet Oral Daily  . nicotine  7 mg Transdermal Daily  . pantoprazole  40 mg Oral Daily  . potassium chloride  30 mEq Oral BID  . rosuvastatin  40 mg Oral Daily  . sodium chloride flush  3 mL Intravenous Q12H  . thiamine  100 mg Oral Q24H   Or  . thiamine injection  100 mg Intravenous Q24H  . traMADol  50 mg Oral BID  . traZODone  50 mg Oral QHS  . Vitamin D (Ergocalciferol)  50,000 Units Oral Weekly   Continuous Infusions: . sodium chloride 50 mL/hr at 06/24/17 0730  . ceFEPime (MAXIPIME) IV Stopped (06/24/17 1610)  . sodium chloride     PRN Meds: acetaminophen **OR** acetaminophen, albuterol, bisacodyl, iopamidol, LORazepam **OR** LORazepam, magnesium citrate, naLOXone (NARCAN)  injection, ondansetron **OR** ondansetron (ZOFRAN) IV, oxyCODONE, polyethylene glycol   Vital Signs    Vitals:   06/24/17 0400 06/24/17 0642 06/24/17 0830  06/24/17 0900  BP: 95/85   101/86  Pulse:  71    Resp: 17   12  Temp:    (!) 97.3 F (36.3 C)  TempSrc:    Oral  SpO2: 100%  100%   Weight:  69.5 kg (153 lb 4.8 oz)    Height:        Intake/Output Summary (Last 24 hours) at 06/24/17 1101 Last data filed at 06/24/17 1000  Gross per 24 hour  Intake              615 ml  Output              400 ml  Net              215 ml   Filed Weights   06/23/17 0800 06/23/17 2025 06/24/17 0642  Weight: 82.1 kg (181 lb) 69.5 kg (153 lb 4.8 oz) 69.5 kg (153 lb 4.8 oz)    Telemetry    Sinus.  - Personally Reviewed   Physical Exam   General: Well developed, well nourished NAD Head: Normal  Neck: Supple  Lungs:  CTA Heart: RRR Abdomen: Soft, NT/ND Extremities: No edema.  Neuro: Grossly intact   Labs    Chemistry  Recent Labs Lab 06/21/17 1637  06/22/17 0337 06/23/17 0631 06/24/17 0248  NA 134* 140 140 140  K 2.9* 3.2* 4.1 5.0  CL 96* 107 107 108  CO2 27 26 23 23   GLUCOSE 629* 107* 202* 191*  BUN 23* 20 18 26*  CREATININE 1.47* 1.36* 1.39* 1.89*  CALCIUM 9.1 8.2* 8.4* 8.5*  PROT 5.7*  --   --  5.3*  ALBUMIN 3.1*  --   --  2.2*  AST 13*  --   --  21  ALT 31  --   --  23  ALKPHOS 178*  --   --  154*  BILITOT 1.2  --   --  1.3*  GFRNONAA 40* 44* 42* 29*  GFRAA 46* 50* 49* 34*  ANIONGAP 11 7 10 9      Hematology  Recent Labs Lab 06/22/17 0337 06/23/17 0631 06/24/17 0248  WBC 8.1 11.2* 18.0*  RBC 4.29 4.21 4.35  HGB 13.6 13.2 13.6  HCT 41.5 40.9 42.7  MCV 96.7 97.1 98.2  MCH 31.7 31.4 31.3  MCHC 32.8 32.3 31.9  RDW 14.2 14.6 14.8  PLT 194 186 213    Cardiac Enzymes  Recent Labs Lab 06/21/17 1637 06/21/17 2129 06/22/17 0337  TROPONINI 0.32* 0.46* 0.41*    BNP  Recent Labs Lab 06/21/17 1637 06/22/17 0337  BNP 1,152.0* 835.0*     Radiology    Dg Lumbar Spine Complete  Result Date: 06/21/2017 CLINICAL DATA:  Acute low back pain for 3 days. EXAM: LUMBAR SPINE - COMPLETE 4+ VIEW COMPARISON:   05/09/2015 and prior studies FINDINGS: No acute fracture noted. Increased severe disc space loss at L3-4 noted with 6 mm anterolisthesis of L3 on L4, new since 05/09/2015. Moderate to severe degenerative disc disease and spondylosis at L4-5 and L5-S1 again identified. Mild multilevel degenerative disc disease otherwise noted. No suspicious focal bony lesion or spondylolysis identified. IMPRESSION: 1. Increased severe degenerative disc disease at L3-4 with new grade 1 anterolisthesis at this level. 2. Moderate to severe degenerative changes at L4-5 and L5-S1 again identified. Electronically Signed   By: Margarette Canada M.D.   On: 06/21/2017 17:22   Ct Head Wo Contrast  Result Date: 06/22/2017 CLINICAL DATA:  Left lower extremity weakness beginning yesterday. EXAM: CT HEAD WITHOUT CONTRAST TECHNIQUE: Contiguous axial images were obtained from the base of the skull through the vertex without intravenous contrast. COMPARISON:  04/03/2015 FINDINGS: Brain: Premature chronic small vessel ischemia with remote lacunar infarcts in the bilateral thalamus and left caudate head. Small remote bilateral cerebellar infarcts. Ischemic gliosis in the cerebral white matter. No evidence of acute infarct, hemorrhage, hydrocephalus, or masslike findings. Vascular: Vertebrobasilar tortuosity. Atherosclerotic calcification. No hyperdense vessel. Skull: No acute or aggressive finding Sinuses/Orbits: Remote blowout fracture of the medial wall left orbit. IMPRESSION: 1. No acute finding. 2. Premature chronic small vessel ischemia with remote lacunar infarcts, as described. Electronically Signed   By: Monte Fantasia M.D.   On: 06/22/2017 07:22   Mr Lumbar Spine Wo Contrast  Result Date: 06/21/2017 CLINICAL DATA:  Weakness for 1 week. History of diabetes. Assess for cauda equina syndrome. EXAM: MRI LUMBAR SPINE WITHOUT CONTRAST TECHNIQUE: Multiplanar, multisequence MR imaging of the lumbar spine was performed. No intravenous contrast was  administered. COMPARISON:  Lumbar spine radiographs June 21, 2017 at 1651 hours and MRI of lumbar spine May 09, 2015 FINDINGS: SEGMENTATION: For the purposes of this report, the last well-formed intervertebral disc will be reported as L5-S1. ALIGNMENT: Maintained lumbar lordosis. Grade 1 L3-4 anterolisthesis. Minimal  grade 1 L4-5 anterolisthesis. Minimal grade 1 L5-S1 retrolisthesis. No spondylolysis. VERTEBRAE:Vertebral bodies are intact. Severe L3-4 and L5-S1 disc height loss progressed from prior examination. Moderate L4-5 disc height loss with disc desiccation all levels compatible with degenerative discs. Focal edema L3-4 disc. Moderate to severe subacute or chronic discogenic endplate changes D7-O2. Mild L3-4 and L4-5 acute on chronic discogenic endplate changes with trace acute component L3-4. Scalloping posterior L3 vertebral body associated with low T1 and bright T2 signal within Batson's plexus. Minimal bone marrow edema L3-4 facet partially volumes with effusion. CONUS MEDULLARIS: Conus medullaris terminates at L1-2 and demonstrates normal morphology and signal characteristics. Central displacement of the cauda equina with disorganized appearance. PARASPINAL AND SOFT TISSUES: Included prevertebral and paraspinal soft tissues are nonacute. Multiple small low signal masses within the uterus compatible with leiomyoma. Trace L3-4 interspinous fluid signal. DISC LEVELS: T11-12: Small broad-based disc bulge, mild facet arthropathy and ligamentum flavum redundancy. Mild canal stenosis without neural foraminal narrowing. T12-L1, L1-2: No disc bulge, canal stenosis nor neural foraminal narrowing. L2-3: Small broad-based disc bulge. Minimal facet arthropathy without canal stenosis. No neural foraminal narrowing. L3-4: Anterolisthesis. Large broad-based disc bulge with superimposed LEFT central 5 x 6 mm disc extrusion, 12 mm superior migration. Moderate facet arthropathy and ligamentum flavum redundancy with  trace facet effusions. Severe canal stenosis. Severe bilateral neural foraminal narrowing and, encroaching upon the exited L 3 nerve. Lateral recess narrowing may affect the traversing L4 nerves. L4-5: Anterolisthesis. Moderate broad-based disc bulge. Moderate to severe facet arthropathy and ligamentum flavum redundancy with trace facet effusions which are likely reactive. Mild canal stenosis including partially effaced LEFT and RIGHT lateral recesses which may affect the traversing L5 nerves. Moderate to severe RIGHT, severe LEFT neural foraminal narrowing. Encroachment upon at LEFT greater than RIGHT exited L4 nerve. L5-S1: Small to moderate broad-based disc bulge asymmetric to the RIGHT. Mild facet arthropathy. No canal stenosis. Severe bilateral neural foraminal narrowing. IMPRESSION: 1. Grade 1 L3-4 and L4-5 anterolisthesis. Grade 1 L5-S1 retrolisthesis. No spondylolysis. 2. Progressed degenerative change of lumbar spine. Superimposed 5 x 6 x 12 mm L3-4 disc extrusion. 3. Severe canal stenosis L3-4 with resultant disorganized cauda equina. Mild canal stenosis L4-5. 4. Severe neural foraminal narrowing L3-4 through L5-S1. 5. L3-4 inflammatory facet arthropathy. 6. Progressive scalloping of L3 posterior vertebral body suggesting chronic venous changes. Findings would be better characterized on contrast-enhanced images. Electronically Signed   By: Elon Alas M.D.   On: 06/21/2017 18:26   Dg Chest Port 1 View  Result Date: 06/21/2017 CLINICAL DATA:  Weakness for 1 week. EXAM: PORTABLE CHEST 1 VIEW COMPARISON:  05/25/2016. FINDINGS: Stable enlarged cardiac silhouette. Mildly prominent pulmonary vasculature with improvement. The interstitial markings are mildly prominent. No pleural fluid seen. Mild bilateral shoulder degenerative changes. IMPRESSION: 1. Mild pulmonary vascular congestion with improvement. 2. Stable cardiomegaly. 3. Mild chronic interstitial lung disease. Electronically Signed   By: Claudie Revering M.D.   On: 06/21/2017 16:52    Cardiac Studies   Cardiac Catheterization: 42/35/3614  LV end diastolic pressure is mildly elevated.  There is no aortic valve stenosis.  No angiographically apparent coronary artery disease.  Tortuous right subclavian requiring Glide wire. Would not use right radial approach if cath was needed in the future.  Continue aggressive medical therapy and blood pressure control.    Echocardiogram: 06/22/2017 Study Conclusions  - Left ventricle: The cavity size was normal. Wall thickness was   increased in a pattern of moderate LVH. Systolic function was  moderately to severely reduced. The estimated ejection fraction   was in the range of 30% to 35%. Diffuse hypokinesis. Features are   consistent with a pseudonormal left ventricular filling pattern,   with concomitant abnormal relaxation and increased filling   pressure (grade 2 diastolic dysfunction). - Mitral valve: There was moderate regurgitation directed   posteriorly. - Left atrium: The atrium was mildly dilated.  Patient Profile     51 y.o. female w/ PMH of hypertensive cardiomyopathy (EF 20-25% by echo in 05/2016), HTN, HLD, Type 2 DM, COPD, Stage 3 CKD, and normal cors by cath in 07/2016 who presented to [facility] on 06/21/2017 for lower extremity weakness. Cardiology consulted for pre-op clearance and CHF.   Assessment & Plan    1. Preoperative Cardiac Clearance for Lumbar disectomy and decompression - presented with weakness and numbness of her lower extremities. Lumbar MRI showed Grade 1 L3-4 and L4-5 anterolisthesis, Grade 1 L5-S1 retrolisthesis with progressive degenerative change of lumbar spine and superimposed 5 x 6 x 12 mm L3-4 disc extrusion with resultant disorganized cauda equina.  - Neurosurgery has been consulted and initially recommended a lumbar disectomy and decompression. Unclear if she will still be going to the OR.  - Patient had a cardiac catheterization in  October 2017 that showed no coronary disease and she has not had chest pain. LV function is unchanged compared to previous. She may proceed without further cardiac evaluation.  2. Hypertensive cardiomyopathy/ Acute on Chronic Systolic CHF - EF 35-57% by echo in 05/2016 with cath in 07/2016 showing normal cors. - Echocardiogram this admission shows ejection fraction 30-35%. She has been noncompliant with medications. Her blood pressure is now borderline. Cardiac medications are on hold. We will follow and add lower dose medications as tolerated.  3. Elevated Troponin - initial troponin 0.32 with repeat values of 0.46 and 0.41. Unclear why cardiac enzymes were checked as she did not report any chest pain.  -Previous cardiac catheterization revealed no coronary disease. She has not had chest pain. No plans for further ischemia evaluation.  4. HTN -Pressure now borderline. Will hold medications and add lower dose stable.  5.  acute on chronic Stage 3 CKD -  renal function worse today. Hold blood pressure medications are Lasix and follow.  6. Atrial tachycardia/ PAC's - noted to have episodes of atrial tachycardia on admission. Resume beta blocker when able.   7. Type 2 DM - Per primary care  8. Medical Noncompliance/ Substance Use - previously self-discontinued her cardiac medications. Compliance with these strongly encouraged. - also uses Cocaine intermittently and consumes 2-3 alcoholic beverages per day. Cessation advised.    Signed, Kirk Ruths , MD 11:01 AM 06/24/2017

## 2017-06-24 NOTE — Progress Notes (Addendum)
S:  Called to bedside to assess patient for lethargy/AMS   O:  Blood pressure 102/61, pulse 88, temperature (!) 96.4 F (35.8 C), temperature source Axillary, resp. rate 14, height 5\' 1"  (1.549 m), weight 69.5 kg (153 lb 4.8 oz), last menstrual period 05/19/2016, SpO2 100 %.   General: Adult female, no distress  Neuro: obtunded, grunts with deep sternal rub  CV: RRR, no MRG  PULM: clear breath sounds, non-labored  GI: non-tender, active bowel sounds  Extremities: -edema    CBC    Component Value Date/Time   WBC 18.0 (H) 06/24/2017 0248   RBC 4.35 06/24/2017 0248   HGB 13.6 06/24/2017 0248   HCT 42.7 06/24/2017 0248   PLT 213 06/24/2017 0248   MCV 98.2 06/24/2017 0248   MCH 31.3 06/24/2017 0248   MCHC 31.9 06/24/2017 0248   RDW 14.8 06/24/2017 0248   LYMPHSABS 1.5 06/24/2017 0248   MONOABS 1.0 06/24/2017 0248   EOSABS 0.0 06/24/2017 0248   BASOSABS 0.0 06/24/2017 0248    BMET    Component Value Date/Time   NA 140 06/24/2017 0248   K 5.0 06/24/2017 0248   CL 108 06/24/2017 0248   CO2 23 06/24/2017 0248   GLUCOSE 191 (H) 06/24/2017 0248   BUN 26 (H) 06/24/2017 0248   CREATININE 1.89 (H) 06/24/2017 0248   CREATININE 1.81 (H) 06/17/2016 1257   CALCIUM 8.5 (L) 06/24/2017 0248   CALCIUM 8.9 09/13/2014 0703   GFRNONAA 29 (L) 06/24/2017 0248   GFRNONAA 45 (L) 02/28/2016 1235   GFRAA 34 (L) 06/24/2017 0248   GFRAA 52 (L) 02/28/2016 1235     Acute Hepatic vs Metabolic Encephalopathy vs Hypotension Related  Attempted Narcan > did not respond  P: Monitor need for intubated > Currently on BiPAP pulling good volumes  UDS pending  EEG pending  With ETOH history > Ammonia Pending  PCT and Lactic Acid Sent   Worsening AKI Hypovolemia? No Urinary output for this 12 hour shift  P: Trend BMP Bolus 1L now  NS @ 75 ml/hr  Place Foley catheter  Strict I&O   Transferring to ICU for closer monitoring. Family updated on plan of care.    [name],  AGACNP-BC [facility]  Pgr: [phone]  PCCM Pgr: [phone]

## 2017-06-24 NOTE — Progress Notes (Addendum)
PT Cancellation Note  Patient Details Name: [name] MRN: [id number] DOB: 04-Sep-1966   Cancelled Treatment:    Reason Eval/Treat Not Completed: Medical issues which prohibited therapy. Attempted to work with pt. Pt drowsy and would only arouse very briefly to verbal/tactile stimuli. Before attempting to sit EOB checked BP. BP with HOB at 30 degrees 64/41. Lowered HOB, placed in trendelenburg, and nursing to room. Pt also with periods of apnea.   [place] 06/24/2017, 1:43 PM [place]

## 2017-06-24 NOTE — Progress Notes (Signed)
Patient transported back to 3M01.  transported and this Therapist, sports.

## 2017-06-24 NOTE — Progress Notes (Signed)
Came to assess the patient post narcan and Is awake alert and Pox 97% on [place]. Not in any distress. No indication for intubation. Refusing to get blood or urine samples but family are trying to convince her.

## 2017-06-24 NOTE — Progress Notes (Addendum)
Name: [name] MRN: [id number] DOB: [birth year]    ADMISSION DATE:  06/21/2017 CONSULTATION DATE:  06/23/17  REFERRING MD :  Alfredia Ferguson  CHIEF COMPLAINT:  SOB  HISTORY OF PRESENT ILLNESS:   51 yo female presented with leg pain and dyspnea with pulmonary edema and acute hypoxia.  She has hx of DCM (EF 20-25%), cocaine abuse, tobacco abuse, COPD, DM, CKD, HTN, HTN, HLD, medical non-compliance.   SUBJECTIVE:   She feels her breathing is better, and just wants to get some sleep.  VITAL SIGNS: BP 101/86 (BP Location: Left Arm)   Pulse 71   Temp (!) 97.3 F (36.3 C) (Oral)   Resp 12   Ht 5\' 1"  (1.549 m)   Wt 153 lb 4.8 oz (69.5 kg)   LMP 05/19/2016   SpO2 100%   BMI 28.97 kg/m   INTAKE/OUTPUT: I/O last 3 completed shifts: In: 230 [P.O.:230] Out: 1300 [Urine:1300]  PHYSICAL EXAMINATION:  General - pleasant Eyes - pupils reactive ENT - no sinus tenderness, no oral exudate, no LAN Cardiac - regular, no murmur Chest - no wheeze, rales Abd - soft, non tender Ext - no edema Skin - no rashes Neuro - normal strength Psych - normal mood   CMP Latest Ref Rng & Units 06/24/2017 06/23/2017 06/22/2017  Glucose 65 - 99 mg/dL 191(H) 202(H) 107(H)  BUN 6 - 20 mg/dL 26(H) 18 20  Creatinine 0.44 - 1.00 mg/dL 1.89(H) 1.39(H) 1.36(H)  Sodium 135 - 145 mmol/L 140 140 140  Potassium 3.5 - 5.1 mmol/L 5.0 4.1 3.2(L)  Chloride 101 - 111 mmol/L 108 107 107  CO2 22 - 32 mmol/L 23 23 26   Calcium 8.9 - 10.3 mg/dL 8.5(L) 8.4(L) 8.2(L)  Total Protein 6.5 - 8.1 g/dL 5.3(L) - -  Total Bilirubin 0.3 - 1.2 mg/dL 1.3(H) - -  Alkaline Phos 38 - 126 U/L 154(H) - -  AST 15 - 41 U/L 21 - -  ALT 14 - 54 U/L 23 - -    CBC Latest Ref Rng & Units 06/24/2017 06/23/2017 06/22/2017  WBC 4.0 - 10.5 K/uL 18.0(H) 11.2(H) 8.1  Hemoglobin 12.0 - 15.0 g/dL 13.6 13.2 13.6  Hematocrit 36.0 - 46.0 % 42.7 40.9 41.5  Platelets 150 - 400 K/uL 213 186 194    ABG    Component Value Date/Time   PHART 7.462 (H)  06/23/2017 1750   PCO2ART 33.0 06/23/2017 1750   PO2ART 136 (H) 06/23/2017 1750   HCO3 23.2 06/23/2017 1750   TCO2 17 09/11/2014 1152   ACIDBASEDEF 0.1 06/23/2017 1750   O2SAT 98.8 06/23/2017 1750    CBG (last 3)   Recent Labs  06/23/17 1634 06/23/17 2244 06/24/17 0903  GLUCAP 144* 201* 182*    Drugs of Abuse     Component Value Date/Time   LABOPIA NONE DETECTED 06/21/2017 1650   COCAINSCRNUR POSITIVE (A) 06/21/2017 1650   LABBENZ NONE DETECTED 06/21/2017 1650   AMPHETMU NONE DETECTED 06/21/2017 1650   THCU NONE DETECTED 06/21/2017 1650   LABBARB NONE DETECTED 06/21/2017 1650     Dg Chest 1 View  Result Date: 06/24/2017 CLINICAL DATA:  Shortness of breath, acute respiratory failure with hypoxia, chronic renal insufficiency, cardiomyopathy, COPD, current smoker. EXAM: CHEST 1 VIEW COMPARISON:  CT scan of the chest and chest x-ray of June 23, 2017 FINDINGS: The lungs are mildly hyperinflated despite the apical lordotic positioning. Hazy increased density in the right perihilar region persists. The retrocardiac region on the left exhibits increased density. There  is no pleural effusion or pneumothorax. The cardiac silhouette is enlarged. The pulmonary vascularity is not engorged. The bony thorax is unremarkable. IMPRESSION: Hyperinflation consistent with COPD. Right perihilar and left retrocardiac interstitial density compatible with atelectasis or pneumonia. Cardiomegaly without significant pulmonary vascular congestion or pulmonary edema. No pleural effusion. Electronically Signed   By: David  Martinique M.D.   On: 06/24/2017 07:48   Ct Head Wo Contrast  Result Date: 06/23/2017 CLINICAL DATA:  51 y/o F; left lower extremity weakness beginning yesterday. EXAM: CT HEAD WITHOUT CONTRAST TECHNIQUE: Contiguous axial images were obtained from the base of the skull through the vertex without intravenous contrast. COMPARISON:  06/22/2017 CT of the head. FINDINGS: Brain: No evidence of acute  infarction, hemorrhage, hydrocephalus, extra-axial collection or mass lesion/mass effect. Stable chronic lacunar infarcts within the left thalamus and left caudate head. Stable mild chronic microvascular ischemic changes of white matter parenchymal volume loss of the brain. Vascular: Mild calcific atherosclerosis of carotid siphons. No hyperdense vessel identified. Skull: Normal. Negative for fracture or focal lesion. Sinuses/Orbits: Chronic left lamina papyracea see a fracture deformity with herniation of extraconal fat. Opacification of left sphenoid sinus. Otherwise negative. Other: None. IMPRESSION: 1. No acute intracranial abnormality identified. 2. Mild chronic microvascular ischemic changes and mild parenchymal volume loss of the brain. Small chronic lacunar infarcts in left thalamus and left caudate head. Electronically Signed   By: Kristine Garbe M.D.   On: 06/23/2017 19:20   Ct Angio Chest Pe W Or Wo Contrast  Result Date: 06/23/2017 CLINICAL DATA:  Dyspnea, question pulmonary embolism, history type II diabetes mellitus, smoking, COPD, essential hypertension, chronic kidney disease, hypertensive cardiomyopathy EXAM: CT ANGIOGRAPHY CHEST WITH CONTRAST TECHNIQUE: Multidetector CT imaging of the chest was performed using the standard protocol during bolus administration of intravenous contrast. Multiplanar CT image reconstructions and MIPs were obtained to evaluate the vascular anatomy. CONTRAST:  80 cc Isovue 370 IV COMPARISON:  CT chest 11/08/2012 FINDINGS: Cardiovascular: Motion artifacts limit assessment of the ascending aorta. Ascending aorta appears upper normal caliber without aneurysm. Suboptimal assessment for dissection due to lack of adequate aortic enhancement. Pulmonary arteries well opacified with mild dilatation of the main pulmonary artery 3.5 cm diameter. No evidence of pulmonary embolism. No pericardial effusion. Mediastinum/Nodes: Esophagus unremarkable. Base of cervical  region normal appearance. No thoracic adenopathy. Lungs/Pleura: Patchy infiltrates throughout RIGHT upper and RIGHT lower lobes, minimally in RIGHT middle lobe question pneumonia. Subsegmental atelectasis in the anterior portion of the LEFT lower lobe adjacent to the basilar portion of the major fissure. No pleural effusion or pneumothorax. Upper Abdomen: BILATERAL adrenal gland thickening without discrete nodule. Remaining visualized upper abdomen unremarkable. Musculoskeletal: No acute osseous findings. Review of the MIP images confirms the above findings. IMPRESSION: No evidence of pulmonary embolism. Patchy infiltrates in RIGHT lung question pneumonia versus asymmetric edema. Subsegmental atelectasis LEFT lower lobe. Enlargement of cardiac chambers with upper normal caliber of the ascending thoracic aorta. Electronically Signed   By: Lavonia Dana M.D.   On: 06/23/2017 19:12   Dg Chest Port 1 View  Result Date: 06/23/2017 CLINICAL DATA:  Shortness of breath EXAM: PORTABLE CHEST 1 VIEW COMPARISON:  06/21/2017, 05/25/2016 FINDINGS: Moderate cardiomegaly. No overt pulmonary edema. No pleural effusion or focal consolidation. No pneumothorax. Generous mediastinum, likely due to portable technique and patient rotation IMPRESSION: Moderate cardiomegaly.  No edema or infiltrate. Electronically Signed   By: Donavan Foil M.D.   On: 06/23/2017 18:12    STUDIES:  MRI L spine 8/27 > L3-4  and L4-5 anterolisthesis, L5-S1 retrolisthesis.  No spondylolysis.  Degenerative changes of L spine with L3-4 disc extrusion.  Severe canal stenosis L3-4 with disorganized cauda equina, mild canal stenosis L4-5.  Severe neural foraminal narrowing L3-4 through L5-S1.  Progressive scalloping L3 posterior verterbral body suggesting chronic venous changes. CT head 8/28 > no acute process. Remote lacunar infarcts. Echo 8/28 > EF 30 to 35%, mod LVH, grade 2 DD, mod MR CT head 8/29 > chronic microvascular changes, chronic lacunar infarcts  in Lt thalamus and Lt caudate head CTA chest 8/29 > patchy infiltrated Rt lung  DISCUSSION: 51 yo female smoker with acute hypoxic respiratory failure 2nd to cocaine use with acute pulmonary edema in setting of COPD and non ischemic CM.  ASSESSMENT / PLAN:  Acute hypoxic respiratory failure. - oxygen to keep SpO2 > 92%  Non ischemic CM. - negative fluid balance as able  Rt lung infiltrate. - ?pneumonia versus cocaine use - cefepime per primary team  COPD with tobacco abuse. - smoking cessation  Cocaine abuse. - needs to stop using  PCCM will sign off.  Chesley Mires, MD [facility] Pulmonary/Critical Care 06/24/2017, 10:59 AM Pager:  [phone] After 3pm call: [phone]

## 2017-06-24 NOTE — Progress Notes (Signed)
MD [facility] notified that bolus was given, BP was up to 107/70. Patient difficult to arouse. MD up to see patient, EEG and MRI ordered. Will continue to monitor patient.

## 2017-06-24 NOTE — Progress Notes (Signed)
Patient ID: [name], female   DOB: [birth year], 51 y.o.   MRN: [id number] BP 98/60 (BP Location: Left Leg)   Pulse 77   Temp (!) 96.4 F (35.8 C) (Axillary)   Resp 14   Ht 5\' 1"  (1.549 m)   Wt 69.5 kg (153 lb 4.8 oz)   LMP 05/19/2016   SpO2 100%   BMI 28.97 kg/m  Patient with acute change in mentation.  Will defer on any surgery at this time.

## 2017-06-24 NOTE — Consult Note (Signed)
NEURO HOSPITALIST CONSULT NOTE   Requestig physician: Dr. Alfredia Ferguson   Reason for Consult: Areas of altered mental status and lack of awareness   History obtained from:  Chart  HPI:                                                                                                                                          [name] is an 51 y.o. female who was brought in for back pain and initially was on the floor and then apparently patient was having periods of unresponsiveness and transferred to the ICU. Much of the history is obtained from the nurse as the patient is unwilling to talk. Apparently she was very vocal this morning and talking on the phone with family members and then suddenly became somewhat hypotensive and since then has not been localizing or following commands. Upon walking in the room patient is somnolent, blood pressure is soft at 98/60. Upon calling her name she quickly awakens but she will not answer my questions. As I'm asking questions she will close her eyes. I will sternal rub her chest and she quickly awakens reaching for my hands and pushing me away. She is able to track my fingers however she takes time to do this and at times her stare at me. I continually needed to do sternal rub to get her to pay attention to me however she still would not follow any commands or answer any questions. Nurse tech who is in the room states that she is multiple times holding her breath and then will start breathing. Currently as stated above she is moving all her extremities 5 out of 5 strength when continually stimulated with noxious stimuli she very quickly with good strength pushes me away and at times his leave me alone.  The only medication that could be sedating that she has had his tramadol other than that patient has not received any sedating medications all day today.  Last known well: Unclear TPA given: No, unclear time of onset   Past Medical History:   Diagnosis Date  . Alcohol use   . Ankle fracture, lateral malleolus, closed 12/30/2011  . Anxiety   . Breast mass, left 12/15/2011  . CKD (chronic kidney disease)   . COPD (chronic obstructive pulmonary disease) ([place])   . Diabetes mellitus, type 2 ([place])   . Diabetic Charcot foot ([place]) 12/30/2011  . Essential hypertension   . History of GI bleed 12/05/2009   Qualifier: Diagnosis of  By: Zeb Comfort    . Hyperlipidemia   . Hypertensive cardiomyopathy ([place]) 11/08/2012   a. 05/2016: echo showing EF of 20-25% with normal cors by cath in 07/2016.  Marland Kitchen Noncompliance with medication regimen   . Obesity   . Panic attacks   .  Tobacco abuse     Past Surgical History:  Procedure Laterality Date  . BREAST BIOPSY    . CARDIAC CATHETERIZATION N/A 07/28/2016   Procedure: Left Heart Cath and Coronary Angiography;  Surgeon: Jettie Booze, MD;  Location: [facility];  Service: Cardiovascular;  Laterality: N/A;  . DILATION AND CURETTAGE OF UTERUS      Family History  Problem Relation Age of Onset  . Hypertension Mother   . Heart attack Mother   . Hypertension Father        CABG   . Hypertension Sister   . Hypertension Brother   . Hypertension Sister   . Cancer Sister        breast   . Arthritis Unknown   . Cancer Unknown   . Diabetes Unknown   . Asthma Unknown   . Hypertension Daughter   . Hypertension Son      Social History:  reports that she has been smoking Cigarettes.  She has a 20.00 pack-year smoking history. She has never used smokeless tobacco. She reports that she uses drugs, including Marijuana and Cocaine, about 3 times per week. She reports that she does not drink alcohol.  No Known Allergies  MEDICATIONS:                                                                                                                     Prior to Admission:  Prescriptions Prior to Admission  Medication Sig Dispense Refill Last Dose  . amLODipine (NORVASC) 10 MG tablet TAKE 1  TABLET BY MOUTH ONCE A DAY. 90 tablet 1 Past Month at Unknown time  . benazepril (LOTENSIN) 40 MG tablet TAKE 1 TABLET BY MOUTH ONCE A DAY. 90 tablet 1 Past Month at Unknown time  . bisoprolol (ZEBETA) 5 MG tablet Take 1 tablet (5 mg total) by mouth daily. 90 tablet 0 Past Month at Unknown time  . Choline Fenofibrate 135 MG capsule TAKE 1 CAPSULE BY MOUTH EVERY DAY. 30 capsule 0 Past Month at Unknown time  . cloNIDine (CATAPRES) 0.2 MG tablet TAKE (1) TABLET BY MOUTH AT BEDTIME FOR BLOOD PRESSURE. 90 tablet 1 Past Month at Unknown time  . clopidogrel (PLAVIX) 75 MG tablet TAKE 1 TABLET BY MOUTH DAILY WITH BREAKFAST. 30 tablet 0 Past Month at Unknown time  . ezetimibe (ZETIA) 10 MG tablet Take 1 tablet (10 mg total) by mouth daily. 90 tablet 1 Past Month at Unknown time  . FLUoxetine (PROZAC) 20 MG capsule TAKE 1 CAPSULE BY MOUTH EVERY DAY. 30 capsule 0 Past Month at Unknown time  . furosemide (LASIX) 20 MG tablet TAKE 1 TABLET BY MOUTH DAILY. 15 tablet 0 Past Month at Unknown time  . gabapentin (NEURONTIN) 100 MG capsule TAKE 1 CAPSULE BY MOUTH THREE TIMES A DAY. 90 capsule 0 Past Month at Unknown time  . hydrOXYzine (ATARAX/VISTARIL) 50 MG tablet TAKE (1) TABLET BY MOUTH AT BEDTIME. 30 tablet 0 Past Month at Unknown time  . montelukast (SINGULAIR)  10 MG tablet TAKE (1) TABLET BY MOUTH AT BEDTIME. 30 tablet 0 Past Month at Unknown time  . pantoprazole (PROTONIX) 40 MG tablet Take 1 tablet (40 mg total) by mouth daily. 90 tablet 1 Past Month at Unknown time  . potassium chloride SA (K-DUR,KLOR-CON) 20 MEQ tablet TAKE 1&1/2 TABLET BY MOUTH DAILY. 45 tablet 0 Past Month at Unknown time  . PROAIR HFA 108 (90 Base) MCG/ACT inhaler INHALE 2 PUFFS EVERY 6 HOURS AS NEEDED FOR SHORTNESS OF BREATH/WHEEZING. 8.5 g 0 Past Month at Unknown time  . repaglinide (PRANDIN) 1 MG tablet TAKE 1 TABLET BY MOUTH 3 TIMES DAILY BEFORE MEALS. 270 tablet 1 Past Month at Unknown time  . rosuvastatin (CRESTOR) 40 MG tablet TAKE  1 TABLET BY MOUTH ONCE A DAY. 30 tablet 0 Past Month at Unknown time  . SPIRIVA HANDIHALER 18 MCG inhalation capsule Place 18 mcg into inhaler and inhale daily.    Past Month at Unknown time  . spironolactone (ALDACTONE) 25 MG tablet TAKE 1 TABLET BY MOUTH TWICE A DAY. 30 tablet 0 Past Month at Unknown time  . TRADJENTA 5 MG TABS tablet TAKE 1 TABLET BY MOUTH ONCE A DAY. 30 tablet 0 Past Month at Unknown time  . traZODone (DESYREL) 50 MG tablet TAKE (1) TABLET BY MOUTH AT BEDTIME. 30 tablet 0 Past Month at Unknown time  . Vitamin D, Ergocalciferol, (DRISDOL) 50000 units CAPS capsule Take 1 capsule (50,000 Units total) by mouth once a week. 12 capsule 1 Past Month at Unknown time  . azelastine (ASTELIN) 0.1 % nasal spray Place 2 sprays into both nostrils 2 (two) times daily. Use in each nostril as directed 30 mL 5 unknown  . fluticasone (FLONASE) 50 MCG/ACT nasal spray Place 2 sprays into both nostrils daily. 48 g 1 unknown  . ipratropium-albuterol (DUONEB) 0.5-2.5 (3) MG/3ML SOLN Take 3 mLs by nebulization every 6 (six) hours as needed. (Patient taking differently: Take 3 mLs by nebulization every 6 (six) hours as needed (for wheezing/shortness of breath). ) 360 mL 1 unknown   Scheduled: . aspirin EC  81 mg Oral Daily  . clopidogrel  75 mg Oral Q breakfast  . cycloSPORINE  1 drop Both Eyes BID  . enoxaparin (LOVENOX) injection  40 mg Subcutaneous Q24H  . ezetimibe  10 mg Oral Daily  . fenofibrate  160 mg Oral Daily  . FLUoxetine  20 mg Oral Daily  . folic acid  1 mg Oral Daily  . gabapentin  100 mg Oral TID  . hydrOXYzine  50 mg Oral QHS  . insulin aspart  0-5 Units Subcutaneous QHS  . insulin aspart  0-9 Units Subcutaneous TID WC  . insulin aspart  3 Units Subcutaneous TID WC  . ipratropium-albuterol  3 mL Nebulization Q6H  . living well with diabetes book   Does not apply Once  . LORazepam  0-4 mg Intravenous Q6H   Followed by  . [START ON 06/25/2017] LORazepam  0-4 mg Intravenous Q12H   . mometasone-formoterol  2 puff Inhalation BID  . montelukast  10 mg Oral QHS  . multivitamin with minerals  1 tablet Oral Daily  . nicotine  7 mg Transdermal Daily  . pantoprazole  40 mg Oral Daily  . rosuvastatin  40 mg Oral Daily  . sodium chloride flush  3 mL Intravenous Q12H  . thiamine  100 mg Oral Q24H   Or  . thiamine injection  100 mg Intravenous Q24H  . traMADol  50  mg Oral BID  . traZODone  50 mg Oral QHS  . Vitamin D (Ergocalciferol)  50,000 Units Oral Weekly     ROS:                                                                                                                                       History obtained from unobtainable from patient due to Patient would not answer questions    Blood pressure 101/86, pulse 71, temperature (!) 97.3 F (36.3 C), temperature source Oral, resp. rate 12, height 5\' 1"  (1.549 m), weight 69.5 kg (153 lb 4.8 oz), last menstrual period 05/19/2016, SpO2 100 %.   Neurologic Examination:                                                                                                      HEENT-  Normocephalic, no lesions, without obvious abnormality.  Normal external eye and conjunctiva.  Normal TM's bilaterally.  Normal auditory canals and external ears. Normal external nose, mucus membranes and septum.  Normal pharynx. Cardiovascular- S1, S2 normal, pulses palpable throughout   Lungs- chest clear, no wheezing, rales, normal symmetric air entry Abdomen- normal findings: bowel sounds normal Extremities- no edema Lymph-no adenopathy palpable Musculoskeletal-no joint tenderness, deformity or swelling Skin-warm and dry, no hyperpigmentation, vitiligo, or suspicious lesions  Neurological Examination Mental Status: Alert With noxious stimuli, quickly closes her eyes and will not respond to any verbal stimuli if not spoken to.  Speech fluent without evidence of aphasia. Will not follow commands Cranial Nerves: II: Blinks to threat  bilaterally  III,IV, VI: ptosis not present, extra-ocular motions intact bilaterally pupils equal, round, reactive to light and accommodation V,VII: Face symmetric, facial light touch sensation normal bilaterally VIII: hearing normal bilaterally IX,X: uvula rises symmetrically   Motor: Right : Upper extremity   5/5    Left:     Upper extremity   5/5  Lower extremity   5/5     Lower extremity   5/5 --5/5 when noxious stimuli is administered however if no noxious stimuli is administered patient lays limp in bed Tone and bulk:normal tone throughout; no atrophy noted Sensory: Pinprick and light touch intact throughout, bilaterally Deep Tendon Reflexes: Depressed throughout Plantars: Right: downgoing   Left: downgoing Cerebellar: Would Not take part in exam  Gait: Not tested      Lab Results: Basic Metabolic Panel:  Recent Labs Lab 06/21/17 1637 06/22/17 0337 06/23/17 0631 06/24/17 0248  NA 134* 140 140 140  K 2.9* 3.2* 4.1 5.0  CL 96* 107 107 108  CO2 27 26 23 23   GLUCOSE 629* 107* 202* 191*  BUN 23* 20 18 26*  CREATININE 1.47* 1.36* 1.39* 1.89*  CALCIUM 9.1 8.2* 8.4* 8.5*  MG  --  1.9  --  1.8  PHOS  --   --   --  3.0    Liver Function Tests:  Recent Labs Lab 06/21/17 1637 06/24/17 0248  AST 13* 21  ALT 31 23  ALKPHOS 178* 154*  BILITOT 1.2 1.3*  PROT 5.7* 5.3*  ALBUMIN 3.1* 2.2*   No results for input(s): LIPASE, AMYLASE in the last 168 hours. No results for input(s): AMMONIA in the last 168 hours.  CBC:  Recent Labs Lab 06/21/17 1637 06/22/17 0337 06/23/17 0631 06/24/17 0248  WBC 7.4 8.1 11.2* 18.0*  NEUTROABS  --   --   --  15.4*  HGB 14.1 13.6 13.2 13.6  HCT 41.4 41.5 40.9 42.7  MCV 95.0 96.7 97.1 98.2  PLT 194 194 186 213    Cardiac Enzymes:  Recent Labs Lab 06/21/17 1637 06/21/17 2129 06/22/17 0337 06/23/17 0631  CKTOTAL 63  --   --  40  CKMB  --   --   --  5.2*  TROPONINI 0.32* 0.46* 0.41*  --     Lipid Panel: No results  for input(s): CHOL, TRIG, HDL, CHOLHDL, VLDL, LDLCALC in the last 168 hours.  CBG:  Recent Labs Lab 06/23/17 1200 06/23/17 1634 06/23/17 2244 06/24/17 0903 06/24/17 1252  GLUCAP 168* 144* 201* 182* 157*    Microbiology: Results for orders placed or performed during the hospital encounter of 06/21/17  MRSA PCR Screening     Status: None   Collection Time: 06/23/17  8:23 PM  Result Value Ref Range Status   MRSA by PCR NEGATIVE NEGATIVE Final    Comment:        The GeneXpert MRSA Assay (FDA approved for NASAL specimens only), is one component of a comprehensive MRSA colonization surveillance program. It is not intended to diagnose MRSA infection nor to guide or monitor treatment for MRSA infections.     Coagulation Studies: No results for input(s): LABPROT, INR in the last 72 hours.  Imaging: Dg Chest 1 View  Result Date: 06/24/2017 CLINICAL DATA:  Shortness of breath, acute respiratory failure with hypoxia, chronic renal insufficiency, cardiomyopathy, COPD, current smoker. EXAM: CHEST 1 VIEW COMPARISON:  CT scan of the chest and chest x-ray of June 23, 2017 FINDINGS: The lungs are mildly hyperinflated despite the apical lordotic positioning. Hazy increased density in the right perihilar region persists. The retrocardiac region on the left exhibits increased density. There is no pleural effusion or pneumothorax. The cardiac silhouette is enlarged. The pulmonary vascularity is not engorged. The bony thorax is unremarkable. IMPRESSION: Hyperinflation consistent with COPD. Right perihilar and left retrocardiac interstitial density compatible with atelectasis or pneumonia. Cardiomegaly without significant pulmonary vascular congestion or pulmonary edema. No pleural effusion. Electronically Signed   By: David  Martinique M.D.   On: 06/24/2017 07:48   Ct Head Wo Contrast  Result Date: 06/23/2017 CLINICAL DATA:  51 y/o F; left lower extremity weakness beginning yesterday. EXAM: CT HEAD  WITHOUT CONTRAST TECHNIQUE: Contiguous axial images were obtained from the base of the skull through the vertex without intravenous contrast. COMPARISON:  06/22/2017 CT of the head. FINDINGS: Brain: No evidence of acute infarction, hemorrhage, hydrocephalus, extra-axial collection or mass lesion/mass effect. Stable chronic lacunar infarcts within the left  thalamus and left caudate head. Stable mild chronic microvascular ischemic changes of white matter parenchymal volume loss of the brain. Vascular: Mild calcific atherosclerosis of carotid siphons. No hyperdense vessel identified. Skull: Normal. Negative for fracture or focal lesion. Sinuses/Orbits: Chronic left lamina papyracea see a fracture deformity with herniation of extraconal fat. Opacification of left sphenoid sinus. Otherwise negative. Other: None. IMPRESSION: 1. No acute intracranial abnormality identified. 2. Mild chronic microvascular ischemic changes and mild parenchymal volume loss of the brain. Small chronic lacunar infarcts in left thalamus and left caudate head. Electronically Signed   By: Kristine Garbe M.D.   On: 06/23/2017 19:20   Ct Angio Chest Pe W Or Wo Contrast  Result Date: 06/23/2017 CLINICAL DATA:  Dyspnea, question pulmonary embolism, history type II diabetes mellitus, smoking, COPD, essential hypertension, chronic kidney disease, hypertensive cardiomyopathy EXAM: CT ANGIOGRAPHY CHEST WITH CONTRAST TECHNIQUE: Multidetector CT imaging of the chest was performed using the standard protocol during bolus administration of intravenous contrast. Multiplanar CT image reconstructions and MIPs were obtained to evaluate the vascular anatomy. CONTRAST:  80 cc Isovue 370 IV COMPARISON:  CT chest 11/08/2012 FINDINGS: Cardiovascular: Motion artifacts limit assessment of the ascending aorta. Ascending aorta appears upper normal caliber without aneurysm. Suboptimal assessment for dissection due to lack of adequate aortic enhancement.  Pulmonary arteries well opacified with mild dilatation of the main pulmonary artery 3.5 cm diameter. No evidence of pulmonary embolism. No pericardial effusion. Mediastinum/Nodes: Esophagus unremarkable. Base of cervical region normal appearance. No thoracic adenopathy. Lungs/Pleura: Patchy infiltrates throughout RIGHT upper and RIGHT lower lobes, minimally in RIGHT middle lobe question pneumonia. Subsegmental atelectasis in the anterior portion of the LEFT lower lobe adjacent to the basilar portion of the major fissure. No pleural effusion or pneumothorax. Upper Abdomen: BILATERAL adrenal gland thickening without discrete nodule. Remaining visualized upper abdomen unremarkable. Musculoskeletal: No acute osseous findings. Review of the MIP images confirms the above findings. IMPRESSION: No evidence of pulmonary embolism. Patchy infiltrates in RIGHT lung question pneumonia versus asymmetric edema. Subsegmental atelectasis LEFT lower lobe. Enlargement of cardiac chambers with upper normal caliber of the ascending thoracic aorta. Electronically Signed   By: Lavonia Dana M.D.   On: 06/23/2017 19:12   Dg Chest Port 1 View  Result Date: 06/23/2017 CLINICAL DATA:  Shortness of breath EXAM: PORTABLE CHEST 1 VIEW COMPARISON:  06/21/2017, 05/25/2016 FINDINGS: Moderate cardiomegaly. No overt pulmonary edema. No pleural effusion or focal consolidation. No pneumothorax. Generous mediastinum, likely due to portable technique and patient rotation IMPRESSION: Moderate cardiomegaly.  No edema or infiltrate. Electronically Signed   By: Donavan Foil M.D.   On: 06/23/2017 18:12       Assessment and plan per attending neurologist  Etta Quill PA-C [facility] [phone]  06/24/2017, 3:57 PM  I had seen the patient reviewed the above note.  On my exam, she does appear to have some mild left-sided neglect. She does move her left side well at times, but I think she does have some left hemiparesis(3-4/5). She  has a right gaze preference, though she does cross midline to the left. She is able to tell me her name, but does not follow other commands or answer simple questions. She does fixate and track me.    Assessment/Plan: This is a 51 year old female with multifocal strokes, likely embolic shower. I'm not certain that today's episode is the same as yesterdays. I also do not suspect that her hypotension/hypoxia is clearly explained by her strokes. Given my concern for her to change, I  took her for a stat MRI/MRA to see if she would be an intervention candidate, which revealed multifocal strokes. She does move her lower extremities, which makes spinal cord infarct less likely is a culprit for her lower extremity weakness. I suspect cardiac emboli given her low EF.   One other consideration could be cocaine induced RCVS, but with no history of acute headache and with her recent problems with hypotension, I would be hesitant to start calcium channel blockers.   1. HgbA1c, fasting lipid panel 2. BP goal systolic > 212 3. Frequent neuro checks 5. Carotid dopplers 6. Prophylactic therapy-Antiplatelet med: Aspirin - dose 325mg  PO or 300mg  PR 7. Risk factor modification 8. Telemetry monitoring 9. PT consult, OT consult, Speech consult 10. Consider CTA once creatinine is stabilized.  11. EEG 12. please page stroke NP  Or  PA  Or MD  from 8am -4 pm as this patient will be followed by the stroke team at this point.   You can look them up on [email or web address]     Roland Rack, MD [facility] [phone]  If 7pm- 7am, please page neurology on call as listed in [place].

## 2017-06-24 NOTE — Progress Notes (Signed)
RT put BIPAP on patient. Patient tolerating well at this time. RT will monitor patient as needed.

## 2017-06-24 NOTE — Care Management Note (Signed)
Case Management Note  Patient Details  Name: [name] MRN: [id number] Date of Birth: 06/15/1966  Subjective/Objective:   From home, hypotensive, AMS, Sleepy, confused today, positive cocaine on admission, also has chronic back pain.  Per ot eval rec SNF,  Pt has not seen patient yet.  Await pt eval.                  Action/Plan: NCM will follow for dc needs.   Expected Discharge Date:                  Expected Discharge Plan:  Skilled Nursing Facility  In-House Referral:  Clinical Social Work  Discharge planning Services  CM Consult  Post Acute Care Choice:    Choice offered to:     DME Arranged:    DME Agency:     HH Arranged:    [place] Agency:     Status of Service:  In process, will continue to follow  If discussed at Long Length of Stay Meetings, dates discussed:    Additional Comments:  Zenon Mayo, RN 06/24/2017, 2:52 PM

## 2017-06-25 ENCOUNTER — Inpatient Hospital Stay (HOSPITAL_COMMUNITY): Payer: Medicare Other

## 2017-06-25 DIAGNOSIS — F17200 Nicotine dependence, unspecified, uncomplicated: Secondary | ICD-10-CM

## 2017-06-25 DIAGNOSIS — R4182 Altered mental status, unspecified: Secondary | ICD-10-CM

## 2017-06-25 DIAGNOSIS — R748 Abnormal levels of other serum enzymes: Secondary | ICD-10-CM

## 2017-06-25 DIAGNOSIS — F1010 Alcohol abuse, uncomplicated: Secondary | ICD-10-CM

## 2017-06-25 DIAGNOSIS — F1410 Cocaine abuse, uncomplicated: Secondary | ICD-10-CM

## 2017-06-25 DIAGNOSIS — G9340 Encephalopathy, unspecified: Secondary | ICD-10-CM

## 2017-06-25 DIAGNOSIS — I6330 Cerebral infarction due to thrombosis of unspecified cerebral artery: Secondary | ICD-10-CM | POA: Insufficient documentation

## 2017-06-25 LAB — BASIC METABOLIC PANEL
Anion gap: 10 (ref 5–15)
Anion gap: 11 (ref 5–15)
BUN: 32 mg/dL — ABNORMAL HIGH (ref 6–20)
BUN: 31 mg/dL — ABNORMAL HIGH (ref 6–20)
CO2: 19 mmol/L — ABNORMAL LOW (ref 22–32)
CO2: 21 mmol/L — ABNORMAL LOW (ref 22–32)
Calcium: 8.2 mg/dL — ABNORMAL LOW (ref 8.9–10.3)
Calcium: 8.1 mg/dL — ABNORMAL LOW (ref 8.9–10.3)
Chloride: 112 mmol/L — ABNORMAL HIGH (ref 101–111)
Chloride: 108 mmol/L (ref 101–111)
Creatinine, Ser: 1.99 mg/dL — ABNORMAL HIGH (ref 0.44–1.00)
Creatinine, Ser: 2.02 mg/dL — ABNORMAL HIGH (ref 0.44–1.00)
GFR calc Af Amer: 32 mL/min — ABNORMAL LOW (ref >60)
GFR calc Af Amer: 31 mL/min — ABNORMAL LOW (ref >60)
GFR calc non Af Amer: 27 mL/min — ABNORMAL LOW (ref >60)
GFR calc non Af Amer: 27 mL/min — ABNORMAL LOW (ref >60)
Glucose, Bld: 125 mg/dL — ABNORMAL HIGH (ref 65–99)
Glucose, Bld: 315 mg/dL — ABNORMAL HIGH (ref 65–99)
Potassium: 5.3 mmol/L — ABNORMAL HIGH (ref 3.5–5.1)
Potassium: 4.5 mmol/L (ref 3.5–5.1)
Sodium: 141 mmol/L (ref 135–145)
Sodium: 140 mmol/L (ref 135–145)

## 2017-06-25 LAB — BLOOD GAS, ARTERIAL
Acid-base deficit: 3.6 mmol/L — ABNORMAL HIGH (ref 0.0–2.0)
Acid-base deficit: 4.7 mmol/L — ABNORMAL HIGH (ref 0.0–2.0)
Bicarbonate: 20 mmol/L (ref 20.0–28.0)
Bicarbonate: 20.6 mmol/L (ref 20.0–28.0)
Delivery systems: POSITIVE
Drawn by: 257881
Drawn by: 44135
Expiratory PAP: 6
FIO2: 40
Inspiratory PAP: 12
O2 Content: 2 L/min
O2 Saturation: 97.4 %
O2 Saturation: 98.1 %
Patient temperature: 97.6
Patient temperature: 98.6
RATE: 8 resp/min
pCO2 arterial: 30.5 mmHg — ABNORMAL LOW (ref 32.0–48.0)
pCO2 arterial: 41.5 mmHg (ref 32.0–48.0)
pH, Arterial: 7.313 — ABNORMAL LOW (ref 7.350–7.450)
pH, Arterial: 7.432 (ref 7.350–7.450)
pO2, Arterial: 104 mmHg (ref 83.0–108.0)
pO2, Arterial: 143 mmHg — ABNORMAL HIGH (ref 83.0–108.0)

## 2017-06-25 LAB — CBC
HCT: 49.4 % — ABNORMAL HIGH (ref 36.0–46.0)
Hemoglobin: 15.9 g/dL — ABNORMAL HIGH (ref 12.0–15.0)
MCH: 32.3 pg (ref 26.0–34.0)
MCHC: 32.2 g/dL (ref 30.0–36.0)
MCV: 100.4 fL — ABNORMAL HIGH (ref 78.0–100.0)
Platelets: 211 10*3/uL (ref 150–400)
RBC: 4.92 MIL/uL (ref 3.87–5.11)
RDW: 15.1 % (ref 11.5–15.5)
WBC: 18.2 10*3/uL — ABNORMAL HIGH (ref 4.0–10.5)

## 2017-06-25 LAB — RAPID URINE DRUG SCREEN, HOSP PERFORMED
Amphetamines: NOT DETECTED
Barbiturates: NOT DETECTED
Benzodiazepines: NOT DETECTED
Cocaine: POSITIVE — AB
Opiates: NOT DETECTED
Tetrahydrocannabinol: POSITIVE — AB

## 2017-06-25 LAB — GLUCOSE, CAPILLARY
Glucose-Capillary: 132 mg/dL — ABNORMAL HIGH (ref 65–99)
Glucose-Capillary: 134 mg/dL — ABNORMAL HIGH (ref 65–99)
Glucose-Capillary: 171 mg/dL — ABNORMAL HIGH (ref 65–99)
Glucose-Capillary: 229 mg/dL — ABNORMAL HIGH (ref 65–99)
Glucose-Capillary: 280 mg/dL — ABNORMAL HIGH (ref 65–99)
Glucose-Capillary: 96 mg/dL (ref 65–99)

## 2017-06-25 LAB — LACTIC ACID, PLASMA
Lactic Acid, Venous: 3.1 mmol/L (ref 0.5–1.9)
Lactic Acid, Venous: 3.9 mmol/L (ref 0.5–1.9)
Lactic Acid, Venous: 2.6 mmol/L (ref 0.5–1.9)

## 2017-06-25 LAB — PROCALCITONIN: Procalcitonin: 0.73 ng/mL

## 2017-06-25 MED ORDER — ENOXAPARIN SODIUM 30 MG/0.3ML ~~LOC~~ SOLN
30.0000 mg | SUBCUTANEOUS | Status: DC
  Administered 2017-06-25 – 2017-06-29 (×5): 30 mg via SUBCUTANEOUS
  Filled 2017-06-25 (×5): qty 0.3

## 2017-06-25 MED ORDER — INSULIN ASPART 100 UNIT/ML ~~LOC~~ SOLN
0.0000 [IU] | SUBCUTANEOUS | Status: DC
  Administered 2017-06-25: 8 [IU] via SUBCUTANEOUS

## 2017-06-25 MED ORDER — SODIUM CHLORIDE 0.9 % IV BOLUS (SEPSIS)
500.0000 mL | Freq: Once | INTRAVENOUS | Status: AC
  Administered 2017-06-25: 500 mL via INTRAVENOUS

## 2017-06-25 MED ORDER — INSULIN ASPART 100 UNIT/ML ~~LOC~~ SOLN
0.0000 [IU] | Freq: Every day | SUBCUTANEOUS | Status: DC
  Administered 2017-06-25 – 2017-06-28 (×2): 2 [IU] via SUBCUTANEOUS

## 2017-06-25 MED ORDER — WHITE PETROLATUM GEL
Status: AC
  Administered 2017-06-25: 11:00:00
  Filled 2017-06-25: qty 1

## 2017-06-25 MED ORDER — NALOXONE HCL 0.4 MG/ML IJ SOLN: 0.4000 mg | Freq: Once | INTRAMUSCULAR | Status: AC

## 2017-06-25 MED ORDER — INSULIN ASPART 100 UNIT/ML ~~LOC~~ SOLN
0.0000 [IU] | Freq: Three times a day (TID) | SUBCUTANEOUS | Status: DC
  Administered 2017-06-26 – 2017-06-29 (×8): 2 [IU] via SUBCUTANEOUS
  Administered 2017-06-29: 1 [IU] via SUBCUTANEOUS
  Administered 2017-06-29: 3 [IU] via SUBCUTANEOUS
  Administered 2017-06-30: 2 [IU] via SUBCUTANEOUS

## 2017-06-25 MED ORDER — WHITE PETROLATUM GEL
Status: AC
  Administered 2017-06-25: 1
  Filled 2017-06-25: qty 1

## 2017-06-25 NOTE — Procedures (Signed)
ELECTROENCEPHALOGRAM REPORT  Date of Study: 06/25/2017  Patient's Name: [name] MRN: [id number] Date of Birth: 1966/05/22  Referring Provider: Rosalin Hawking, MD  Clinical History: 51 year old woman with embolic strokes and altered mental status.  Medications: acetaminophen (TYLENOL)  albuterol (PROVENTIL) (2.5 MG/3ML) aspirin EC tablet 81 mg  bisacodyl (DULCOLAX) EC tablet 5 mg  ceFEPIme (MAXIPIME) 2 g  IVPB clopidogrel (PLAVIX) tablet 75 mg  enoxaparin (LOVENOX) injection 30 mg  ezetimibe (ZETIA) tablet 10 mg  fenofibrate tablet 409 mg  folic acid (FOLVITE) tablet 1 mg  insulin aspart (novoLOG) injection 2-6 Units  iopamidol (ISOVUE-370) 76 % injection 80 mL  ipratropium-albuterol (DUONEB)  naloxone (NARCAN) injection 0.4 mg  nicotine patch 7 mg  ondansetron (ZOFRAN) injection 4 mg  pantoprazole (PROTONIX) EC tablet 40 mg  rosuvastatin (CRESTOR) tablet 40 mg  Thiamine Vitamin D  Technical Summary: A multichannel digital EEG recording measured by the international 10-20 system with electrodes applied with paste and impedances below 5000 ohms performed in our laboratory with EKG monitoring in an awake and drowsy patient.  Hyperventilation and photic stimulation were not performed.  The digital EEG was referentially recorded, reformatted, and digitally filtered in a variety of bipolar and referential montages for optimal display.    Description: The patient is awake and drowsy during the recording.  During maximal wakefulness, there is a symmetric, medium voltage 8-9 Hz posterior dominant rhythm that attenuates with eye opening.  The record is symmetric.  There is mild diffuse slowing.  During drowsiness,, there is an increase in theta slowing of the background.  Stage 2 sleep is not seen.  There were no epileptiform discharges or electrographic seizures seen.    EKG lead was unremarkable.  Impression: This awake and drowsy EEG is mildly abnormal due to mild diffuse  slowing.    Clinical Correlation: The above finding is indicative of diffuse cerebral dysfunction, which is nonspecific and may be secondary to hypoxic, toxic-metabolic or other diffuse physiologic abnormality.  Clinical correlation advised.  Metta Clines, DO

## 2017-06-25 NOTE — Progress Notes (Signed)
eLink Physician-Brief Progress Note Patient Name: [name] DOB: 1966-09-02 MRN: [id number]   Date of Service  06/25/2017  HPI/Events of Note  Notified of hyperglycemia. Patient currently with Accu-Cheks every 4 hours and standard sliding scale. Transferred out of the ICU today.   eICU Interventions  1. Continuing Accu-Cheks every 4 hours 2. Switching to moderate dose sliding scale algorithm      Intervention Category Intermediate Interventions: Hyperglycemia - evaluation and treatment  Tera Partridge 06/25/2017, 6:18 PM

## 2017-06-25 NOTE — Evaluation (Signed)
Physical Therapy Evaluation Patient Details Name: [name] MRN: [id number] DOB: 03/25/1966 Today's Date: 06/25/2017   History of Present Illness  51 y.o. female with medical history significant of dilated cardiomyopathy with ejection fraction of 20-25%, diabetes mellitus type 2 uncontrolled with chronic kidney disease stage III, uncontrolled hypertension, cocaine abuse, tobacco abuse, chronic obstructive pulmonary disease, hyperlipidemia, and medical noncompliance who presents the emergency department stating that she can no longer walk her legs won't hold her up. after transfer to ICU due to change in MS pt dx with acute hpoxic resp failure, non-ischemic cardiomyopathy, mixed metabolic acidosis, resp acidosis, hyperkalemia, R lung infiltrate, and encephalopathy.  Neurosurgery following but surgery deferred intiatlly due to medical issues and mentation change.  Pt with HNP at L3/4.  Pt with other significant PMH of panic attacks, hypertensivecardiomyopathy, DM, essential HTN, diabetic charcot foot, COPD, CKD, ankle fx, alcohol and cocaine use.    Clinical Impression  Pt was able to get up OOB and walk to the bathroom with one person physical assist.  She continues to have interesting mentation, although baseline is really not know, but certainly better than the past few days.  She is easily agitated and was perseverating on finding something in her wallet (a green card, ?her debit card) and repeatedly turned the wallet over and over looking in the same pockets and then doing it again.  She is impulsive and her mood changes for angry to apologetic in a matter of seconds.  She is too weak to be in a hotel alone, so I am recommending SNF level rehab at discharge.   PT to follow acutely for deficits listed below.       Follow Up Recommendations SNF    Equipment Recommendations  Rolling walker with 5" wheels;3in1 (PT)    Recommendations for Other Services   NA    Precautions / Restrictions  Precautions Precautions: Fall Restrictions Weight Bearing Restrictions: No      Mobility  Bed Mobility Overal bed mobility: Needs Assistance Bed Mobility: Supine to Sit;Sit to Supine     Supine to sit: Min guard Sit to supine: Mod assist   General bed mobility comments: Min guard assist at trunk for balance while transitioning to EOB from deflated air mattress. Mod assist to help lift both legs up into bed.   Transfers Overall transfer level: Needs assistance Equipment used: Rolling walker (2 wheeled) Transfers: Sit to/from Stand Sit to Stand: Min assist;Mod assist         General transfer comment: Mod assist from lower toilet, min assist from higher bed to support trunk over weak legs.   Ambulation/Gait Ambulation/Gait assistance: Min assist Ambulation Distance (Feet): 15 Feet Assistive device: Rolling walker (2 wheeled) Gait Pattern/deviations: Step-through pattern;Shuffle Gait velocity: decreased Gait velocity interpretation: Below normal speed for age/gender General Gait Details: Pt with weak, shuffling gait pattern to the bathroom and back to the bed.  Verbal cues for safe RW use.  Assist at trunk over weak legs and to help steer RW.          Balance Overall balance assessment: Needs assistance Sitting-balance support: Feet supported;No upper extremity supported Sitting balance-Leahy Scale: Good Sitting balance - Comments: long sitting in bed going through her purse.   Standing balance support: Bilateral upper extremity supported;During functional activity Standing balance-Leahy Scale: Poor Standing balance comment: needs RW and external support.  Pertinent Vitals/Pain Pain Assessment: No/denies pain    Home Living Family/patient expects to be discharged to:: Other (Comment) (hotel) Living Arrangements: Alone Available Help at Discharge: Personal care attendant;Family;Available 24 hours/day Type of Home:  Other(Comment) ("motel room" )       Home Layout: One level   Additional Comments: Not sure of the accuracy of her answers.    Prior Function Level of Independence: Independent         Comments: Pt reports that she does all her own ADLs and IADLs. Pt reporting poor activity tolerance and breathing     Hand Dominance   Dominant Hand: Right    Extremity/Trunk Assessment   Upper Extremity Assessment Upper Extremity Assessment: Defer to OT evaluation    Lower Extremity Assessment Lower Extremity Assessment: Generalized weakness (equally weak bil) RLE Sensation:  (not formally tested)    Cervical / Trunk Assessment Cervical / Trunk Assessment: Normal  Communication   Communication: No difficulties  Cognition Arousal/Alertness: Awake/alert Behavior During Therapy: Restless;Agitated;Impulsive Overall Cognitive Status: No family/caregiver present to determine baseline cognitive functioning Area of Impairment: Orientation;Attention;Memory;Following commands;Safety/judgement;Awareness;Problem solving                 Orientation Level: Disoriented to;Time (July-August, 19......) Current Attention Level: Sustained   Following Commands: Follows one step commands consistently Safety/Judgement: Decreased awareness of safety;Decreased awareness of deficits Awareness: Intellectual;Emergent   General Comments: Pt is perseverating on her wallet and finding her "green card" (I think this is her debit card).  She must have open and closed the wallet 50 times in 10 minutes looking and would not stop.  She became irrationally angry at times, yelling out angrily at therapist and tech, and then calming and apologizing.  At times her speech was non-sensical and other times clear.              Assessment/Plan    PT Assessment Patient needs continued PT services  PT Problem List Decreased strength;Decreased activity tolerance;Decreased balance;Decreased mobility;Decreased  cognition;Decreased knowledge of use of DME       PT Treatment Interventions DME instruction;Stair training;Gait training;Functional mobility training;Therapeutic exercise;Therapeutic activities;Balance training;Neuromuscular re-education;Patient/family education;Cognitive remediation    PT Goals (Current goals can be found in the Care Plan section)  Acute Rehab PT Goals Patient Stated Goal: to find her green card PT Goal Formulation: With patient Time For Goal Achievement: 07/09/17 Potential to Achieve Goals: Good    Frequency Min 3X/week   Barriers to discharge Decreased caregiver support not sure of assist at home       AM-PAC PT "6 Clicks" Daily Activity  Outcome Measure Difficulty turning over in bed (including adjusting bedclothes, sheets and blankets)?: None Difficulty moving from lying on back to sitting on the side of the bed? : None Difficulty sitting down on and standing up from a chair with arms (e.g., wheelchair, bedside commode, etc,.)?: Unable Help needed moving to and from a bed to chair (including a wheelchair)?: A Little Help needed walking in hospital room?: A Little Help needed climbing 3-5 steps with a railing? : A Lot 6 Click Score: 17    End of Session Equipment Utilized During Treatment: Gait belt Activity Tolerance: Patient limited by fatigue Patient left: in bed;with call bell/phone within reach;with bed alarm set Nurse Communication: Mobility status PT Visit Diagnosis: Muscle weakness (generalized) (M62.81);Difficulty in walking, not elsewhere classified (R26.2)    Time: 9476-5465 PT Time Calculation (min) (ACUTE ONLY): 41 min   Charges:  [name] Camora Tremain, PT, DPT [phone]   PT Evaluation $PT Eval Moderate Complexity: 1 Mod PT Treatments $Therapeutic Activity: 23-37 mins   06/25/2017, 4:58 PM

## 2017-06-25 NOTE — Progress Notes (Signed)
Pt received at this time with family at bedside. Pt stable, neuro intact. Pt denies pain or discomfort. No noted distress. Pt oriented to room. Safety measures in place. Call bell within reach. Will continue to monitor.

## 2017-06-25 NOTE — Progress Notes (Signed)
Report received from RN. Patients orders reviewed. She is to be Asp, Sz and Fall precautions. Bed alarm on. Will continue to monitor. Safety Maintained.Marland Kitchen

## 2017-06-25 NOTE — Progress Notes (Signed)
Progress Note  Patient Name: [name] Date of Encounter: 06/25/2017  Primary Cardiologist: Dr. Bronson Ing  Subjective   Alert and oriented; denies CP or dyspnea  Inpatient Medications    Scheduled Meds: .  stroke: mapping our early stages of recovery book   Does not apply Once  . aspirin EC  81 mg Oral Daily  . clopidogrel  75 mg Oral Q breakfast  . cycloSPORINE  1 drop Both Eyes BID  . enoxaparin (LOVENOX) injection  30 mg Subcutaneous Q24H  . ezetimibe  10 mg Oral Daily  . fenofibrate  160 mg Oral Daily  . folic acid  1 mg Oral Daily  . insulin aspart  2-6 Units Subcutaneous Q4H  . ipratropium-albuterol  3 mL Nebulization TID  . living well with diabetes book   Does not apply Once  . mometasone-formoterol  2 puff Inhalation BID  . multivitamin with minerals  1 tablet Oral Daily  . nicotine  7 mg Transdermal Daily  . pantoprazole  40 mg Oral Daily  . rosuvastatin  40 mg Oral Daily  . sodium chloride flush  3 mL Intravenous Q12H  . thiamine  100 mg Oral Q24H   Or  . thiamine injection  100 mg Intravenous Q24H  . Vitamin D (Ergocalciferol)  50,000 Units Oral Weekly   Continuous Infusions: . sodium chloride 75 mL/hr at 06/24/17 1953  . ceFEPime (MAXIPIME) IV Stopped (06/24/17 4098)  . sodium chloride     PRN Meds: acetaminophen **OR** acetaminophen, albuterol, bisacodyl, iopamidol, magnesium citrate, naLOXone (NARCAN)  injection, ondansetron **OR** ondansetron (ZOFRAN) IV, polyethylene glycol   Vital Signs    Vitals:   06/25/17 1000 06/25/17 1100 06/25/17 1200 06/25/17 1252  BP: (!) 116/98 (!) 127/106 117/73 124/89  Pulse:   86 96  Resp: 19 16 15 16   Temp:   99.6 F (37.6 C) 97.9 F (36.6 C)  TempSrc:   Oral Oral  SpO2: 100% 100% 100% 99%  Weight:      Height:        Intake/Output Summary (Last 24 hours) at 06/25/17 1329 Last data filed at 06/25/17 0600  Gross per 24 hour  Intake          2208.75 ml  Output              290 ml  Net           1918.75 ml   Filed Weights   06/24/17 0642 06/24/17 1935 06/25/17 0424  Weight: 69.5 kg (153 lb 4.8 oz) 69.5 kg (153 lb 3.5 oz) 70 kg (154 lb 5.2 oz)    Telemetry    Sinus with PVC.  - Personally Reviewed   Physical Exam   General: WD/WN NAD Head: Normal With normal eyelids Neck: Supple, no JVD Lungs:  CTA; no wheeze Heart: RRR, no murmur Abdomen: Soft, NT/ND, no masses Extremities: No edema. No deformity  Neuro: moves all ext   Labs    Chemistry  Recent Labs Lab 06/21/17 1637  06/24/17 0248 06/24/17 2147 06/25/17 0557  NA 134*  < > 140 140 141  K 2.9*  < > 5.0 6.0* 5.3*  CL 96*  < > 108 109 112*  CO2 27  < > 23 19* 19*  GLUCOSE 629*  < > 191* 71 125*  BUN 23*  < > 26* 32* 32*  CREATININE 1.47*  < > 1.89* 2.04* 1.99*  CALCIUM 9.1  < > 8.5* 8.8* 8.2*  PROT 5.7*  --  5.3*  --   --   ALBUMIN 3.1*  --  2.2*  --   --   AST 13*  --  21  --   --   ALT 31  --  23  --   --   ALKPHOS 178*  --  154*  --   --   BILITOT 1.2  --  1.3*  --   --   GFRNONAA 40*  < > 29* 27* 27*  GFRAA 46*  < > 34* 31* 32*  ANIONGAP 11  < > 9 12 10   < > = values in this interval not displayed.   Hematology  Recent Labs Lab 06/23/17 0631 06/24/17 0248 06/25/17 0248  WBC 11.2* 18.0* 18.2*  RBC 4.21 4.35 4.92  HGB 13.2 13.6 15.9*  HCT 40.9 42.7 49.4*  MCV 97.1 98.2 100.4*  MCH 31.4 31.3 32.3  MCHC 32.3 31.9 32.2  RDW 14.6 14.8 15.1  PLT 186 213 211    Cardiac Enzymes  Recent Labs Lab 06/21/17 1637 06/21/17 2129 06/22/17 0337  TROPONINI 0.32* 0.46* 0.41*    BNP  Recent Labs Lab 06/21/17 1637 06/22/17 0337  BNP 1,152.0* 835.0*     Radiology    Dg Lumbar Spine Complete  Result Date: 06/21/2017 CLINICAL DATA:  Acute low back pain for 3 days. EXAM: LUMBAR SPINE - COMPLETE 4+ VIEW COMPARISON:  05/09/2015 and prior studies FINDINGS: No acute fracture noted. Increased severe disc space loss at L3-4 noted with 6 mm anterolisthesis of L3 on L4, new since 05/09/2015.  Moderate to severe degenerative disc disease and spondylosis at L4-5 and L5-S1 again identified. Mild multilevel degenerative disc disease otherwise noted. No suspicious focal bony lesion or spondylolysis identified. IMPRESSION: 1. Increased severe degenerative disc disease at L3-4 with new grade 1 anterolisthesis at this level. 2. Moderate to severe degenerative changes at L4-5 and L5-S1 again identified. Electronically Signed   By: Margarette Canada M.D.   On: 06/21/2017 17:22   Ct Head Wo Contrast  Result Date: 06/22/2017 CLINICAL DATA:  Left lower extremity weakness beginning yesterday. EXAM: CT HEAD WITHOUT CONTRAST TECHNIQUE: Contiguous axial images were obtained from the base of the skull through the vertex without intravenous contrast. COMPARISON:  04/03/2015 FINDINGS: Brain: Premature chronic small vessel ischemia with remote lacunar infarcts in the bilateral thalamus and left caudate head. Small remote bilateral cerebellar infarcts. Ischemic gliosis in the cerebral white matter. No evidence of acute infarct, hemorrhage, hydrocephalus, or masslike findings. Vascular: Vertebrobasilar tortuosity. Atherosclerotic calcification. No hyperdense vessel. Skull: No acute or aggressive finding Sinuses/Orbits: Remote blowout fracture of the medial wall left orbit. IMPRESSION: 1. No acute finding. 2. Premature chronic small vessel ischemia with remote lacunar infarcts, as described. Electronically Signed   By: Monte Fantasia M.D.   On: 06/22/2017 07:22   Mr Lumbar Spine Wo Contrast  Result Date: 06/21/2017 CLINICAL DATA:  Weakness for 1 week. History of diabetes. Assess for cauda equina syndrome. EXAM: MRI LUMBAR SPINE WITHOUT CONTRAST TECHNIQUE: Multiplanar, multisequence MR imaging of the lumbar spine was performed. No intravenous contrast was administered. COMPARISON:  Lumbar spine radiographs June 21, 2017 at 1651 hours and MRI of lumbar spine May 09, 2015 FINDINGS: SEGMENTATION: For the purposes of this  report, the last well-formed intervertebral disc will be reported as L5-S1. ALIGNMENT: Maintained lumbar lordosis. Grade 1 L3-4 anterolisthesis. Minimal grade 1 L4-5 anterolisthesis. Minimal grade 1 L5-S1 retrolisthesis. No spondylolysis. VERTEBRAE:Vertebral bodies are intact. Severe L3-4 and L5-S1 disc height loss progressed from prior  examination. Moderate L4-5 disc height loss with disc desiccation all levels compatible with degenerative discs. Focal edema L3-4 disc. Moderate to severe subacute or chronic discogenic endplate changes O3-J0. Mild L3-4 and L4-5 acute on chronic discogenic endplate changes with trace acute component L3-4. Scalloping posterior L3 vertebral body associated with low T1 and bright T2 signal within Batson's plexus. Minimal bone marrow edema L3-4 facet partially volumes with effusion. CONUS MEDULLARIS: Conus medullaris terminates at L1-2 and demonstrates normal morphology and signal characteristics. Central displacement of the cauda equina with disorganized appearance. PARASPINAL AND SOFT TISSUES: Included prevertebral and paraspinal soft tissues are nonacute. Multiple small low signal masses within the uterus compatible with leiomyoma. Trace L3-4 interspinous fluid signal. DISC LEVELS: T11-12: Small broad-based disc bulge, mild facet arthropathy and ligamentum flavum redundancy. Mild canal stenosis without neural foraminal narrowing. T12-L1, L1-2: No disc bulge, canal stenosis nor neural foraminal narrowing. L2-3: Small broad-based disc bulge. Minimal facet arthropathy without canal stenosis. No neural foraminal narrowing. L3-4: Anterolisthesis. Large broad-based disc bulge with superimposed LEFT central 5 x 6 mm disc extrusion, 12 mm superior migration. Moderate facet arthropathy and ligamentum flavum redundancy with trace facet effusions. Severe canal stenosis. Severe bilateral neural foraminal narrowing and, encroaching upon the exited L 3 nerve. Lateral recess narrowing may affect the  traversing L4 nerves. L4-5: Anterolisthesis. Moderate broad-based disc bulge. Moderate to severe facet arthropathy and ligamentum flavum redundancy with trace facet effusions which are likely reactive. Mild canal stenosis including partially effaced LEFT and RIGHT lateral recesses which may affect the traversing L5 nerves. Moderate to severe RIGHT, severe LEFT neural foraminal narrowing. Encroachment upon at LEFT greater than RIGHT exited L4 nerve. L5-S1: Small to moderate broad-based disc bulge asymmetric to the RIGHT. Mild facet arthropathy. No canal stenosis. Severe bilateral neural foraminal narrowing. IMPRESSION: 1. Grade 1 L3-4 and L4-5 anterolisthesis. Grade 1 L5-S1 retrolisthesis. No spondylolysis. 2. Progressed degenerative change of lumbar spine. Superimposed 5 x 6 x 12 mm L3-4 disc extrusion. 3. Severe canal stenosis L3-4 with resultant disorganized cauda equina. Mild canal stenosis L4-5. 4. Severe neural foraminal narrowing L3-4 through L5-S1. 5. L3-4 inflammatory facet arthropathy. 6. Progressive scalloping of L3 posterior vertebral body suggesting chronic venous changes. Findings would be better characterized on contrast-enhanced images. Electronically Signed   By: Elon Alas M.D.   On: 06/21/2017 18:26   Dg Chest Port 1 View  Result Date: 06/21/2017 CLINICAL DATA:  Weakness for 1 week. EXAM: PORTABLE CHEST 1 VIEW COMPARISON:  05/25/2016. FINDINGS: Stable enlarged cardiac silhouette. Mildly prominent pulmonary vasculature with improvement. The interstitial markings are mildly prominent. No pleural fluid seen. Mild bilateral shoulder degenerative changes. IMPRESSION: 1. Mild pulmonary vascular congestion with improvement. 2. Stable cardiomegaly. 3. Mild chronic interstitial lung disease. Electronically Signed   By: Claudie Revering M.D.   On: 06/21/2017 16:52    Cardiac Studies   Cardiac Catheterization: 09/38/1829  LV end diastolic pressure is mildly elevated.  There is no aortic valve  stenosis.  No angiographically apparent coronary artery disease.  Tortuous right subclavian requiring Glide wire. Would not use right radial approach if cath was needed in the future.  Continue aggressive medical therapy and blood pressure control.    Echocardiogram: 06/22/2017 Study Conclusions  - Left ventricle: The cavity size was normal. Wall thickness was   increased in a pattern of moderate LVH. Systolic function was   moderately to severely reduced. The estimated ejection fraction   was in the range of 30% to 35%. Diffuse hypokinesis. Features are  consistent with a pseudonormal left ventricular filling pattern,   with concomitant abnormal relaxation and increased filling   pressure (grade 2 diastolic dysfunction). - Mitral valve: There was moderate regurgitation directed   posteriorly. - Left atrium: The atrium was mildly dilated.  Patient Profile     51 y.o. female w/ PMH of hypertensive cardiomyopathy (EF 20-25% by echo in 05/2016), HTN, HLD, Type 2 DM, COPD, Stage 3 CKD, and normal cors by cath in 07/2016 who presented to [facility] on 06/21/2017 for lower extremity weakness. Cardiology consulted for pre-op clearance and CHF.   Assessment & Plan    1. Preoperative Cardiac Clearance for Lumbar disectomy and decompression - presented with weakness and numbness of her lower extremities. Lumbar MRI showed Grade 1 L3-4 and L4-5 anterolisthesis, Grade 1 L5-S1 retrolisthesis with progressive degenerative change of lumbar spine and superimposed 5 x 6 x 12 mm L3-4 disc extrusion with resultant disorganized cauda equina.  - Neurosurgery has been consulted and initially recommended a lumbar disectomy and decompression. Unclear if she will still be going to the OR.  - Patient had a cardiac catheterization in October 2017 that showed no coronary disease and she has not had chest pain. LV function is unchanged compared to previous.   Surgery has been delayed due to CVA. She may  proceed when ok with neurosurgery without further cardiac evaluation.  2. Hypertensive cardiomyopathy/ Acute on Chronic Systolic CHF - EF 13-14% by echo in 05/2016 with cath in 07/2016 showing normal cors. - Echocardiogram this admission shows ejection fraction 30-35%. She has been noncompliant with medications. Cardiac medications are on hold due to borderline BP and recent CVA. Resume at low dose when able.  3. Elevated Troponin - initial troponin 0.32 with repeat values of 0.46 and 0.41. Unclear why cardiac enzymes were checked as she did not report any chest pain.  -Previous cardiac catheterization revealed no coronary disease. She has not had chest pain. No plans for further ischemia evaluation.  4. HTN -Hold BP meds  5.  acute on chronic Stage 3 CKD -  Hold all BP meds and follow renal function.   6. Atrial tachycardia/ PAC's - noted to have episodes of atrial tachycardia on admission. Resume beta blocker when able.   7. Type 2 DM - Per primary care  8. Medical Noncompliance/ Substance Use    Signed, Kirk Ruths , MD 1:29 PM 06/25/2017

## 2017-06-25 NOTE — Progress Notes (Signed)
OT Cancellation Note  Patient Details Name: [name] MRN: [id number] DOB: 1966-01-10   Cancelled Treatment:    Reason Eval/Treat Not Completed: Other (comment) (Being tranferred to floor. will attmept later.)  [place], OT/L  [phone] 06/25/2017 06/25/2017, 2:52 PM

## 2017-06-25 NOTE — Progress Notes (Signed)
Paged neurology. Changed Q4 to ACHS, verbal order taken from Dr Lucky Rathke.

## 2017-06-25 NOTE — Progress Notes (Signed)
Inpatient Diabetes Program Recommendations  AACE/ADA: New Consensus Statement on Inpatient Glycemic Control (2015)  Target Ranges:  Prepandial:   less than 140 mg/dL      Peak postprandial:   less than 180 mg/dL (1-2 hours)      Critically ill patients:  140 - 180 mg/dL   Results for ANGELYSE, HESLIN (MRN [id number]) as of 06/25/2017 11:36  Ref. Range 06/24/2017 23:40 06/24/2017 23:58 06/25/2017 03:16 06/25/2017 08:25  Glucose-Capillary Latest Ref Range: 65 - 99 mg/dL 63 (L) 96 132 (H) 134 (H)    Home DM Meds: Tradjenta 5 mg daily       Prandin 1 mg TID  Current Insulin Orders: Novolog 2-4-6 Q4 hours per ICU Glycemic Control Protocol      MD- Note patient currently NPO.  Note issues last PM with patient's mental status/ acidosis, etc.  Note MD's questioning if family is bringing illegal substances to pt in the hospital.  Hypoglycemic at midnight.  Last Novolog dose given to patient prior to Hypoglycemic event was 12 pm yesterday.  May want to change Novolog SSI to Novolog Sensitive Correction Scale/ SSI (0-9 units) Q4 hours (per the regular Glycemic Control order set)    --Will follow patient during hospitalization--  Wyn Quaker RN, MSN, CDE Diabetes Coordinator Inpatient Glycemic Control Team Team Pager: [phone] (8a-5p)

## 2017-06-25 NOTE — Progress Notes (Signed)
Patient refused CPAP for the evening. RT will continue to monitor.

## 2017-06-25 NOTE — Progress Notes (Signed)
8/30 1910- pt arrived from [place], I received bedside report. Pt VSS/NAD. On Bipap. A/Ox2, refusing to answer some questions and stating "I'm going to sue you"   1935- Pt's BP 56/48. Manual BP unable to be obtained by myself and another RN. Pt awake and talking, still stating that she wants to sue. Dr. Milinda Hirschfeld called via [facility]. 29mL NS bolus ordered. BP improved  2250- Dr. Ashok Cordia notified of critical lab values K+ 6 and lactate elevated. Kayexalate ordered.   2315- pt complained of SOB, placed back on BiPAP  2340- pt having increased lethargy, will not answer orientation questions, unable to open eyes. Pt's CBG 63, 25 mL D50 given, repeat CBG 96. No improvement in mental status. Dr. Chase Caller contacted via El[facility], STAT ABG ordered.   8/31 0050- ABG showing resp acidosis, will continue on BiPAP. Narcan also given at this time with improvement in mental status. 545mL bolus per Chase Caller, MD   0112- UDS positive for cocaine and THC. THC not previously positive on admission, pt denies use of THC.  At this time overall mental status has improved. Pt A/O x4. VSS/NAD. Labs pending. Will continue to monitor.   Torie Katlin Bortner, SRNA

## 2017-06-25 NOTE — Progress Notes (Signed)
intemittnet somolence last few to several hours Concern that family giving drugs to patient surreptiouslye patinet intermittently waking up wit narcan Also late PM lactic acidosis and mixed metabolic/resp acidosis U Tox - positive for cocaine 8/28 (06/21/2017 admit  And again 06/25/2017 but MJ positive is new  Plan Fluid bolus bipap Recheck labs Narcan prn If worse intuate cvl   Dr. Brand Males, M.D., [facility].C.P Pulmonary and Critical Care Medicine Staff Physician [facility] Pager: [phone], If no answer or between  15:00h - 7:00h: call [phone]  06/25/2017 2:29 AM       PULMONARY  Recent Labs Lab 06/23/17 1750 06/24/17 1410 06/25/17 0030  PHART 7.462* 7.432 7.313*  PCO2ART 33.0 30.5* 41.5  PO2ART 136* 104 143*  HCO3 23.2 20.0 20.6  O2SAT 98.8 97.4 98.1    CBC  Recent Labs Lab 06/22/17 0337 06/23/17 0631 06/24/17 0248  HGB 13.6 13.2 13.6  HCT 41.5 40.9 42.7  WBC 8.1 11.2* 18.0*  PLT 194 186 213    COAGULATION No results for input(s): INR in the last 168 hours.  CARDIAC   Recent Labs Lab 06/21/17 1637 06/21/17 2129 06/22/17 0337  TROPONINI 0.32* 0.46* 0.41*   No results for input(s): PROBNP in the last 168 hours.   CHEMISTRY  Recent Labs Lab 06/21/17 1637 06/22/17 0337 06/23/17 0631 06/24/17 0248 06/24/17 2147  NA 134* 140 140 140 140  K 2.9* 3.2* 4.1 5.0 6.0*  CL 96* 107 107 108 109  CO2 27 26 23 23  19*  GLUCOSE 629* 107* 202* 191* 71  BUN 23* 20 18 26* 32*  CREATININE 1.47* 1.36* 1.39* 1.89* 2.04*  CALCIUM 9.1 8.2* 8.4* 8.5* 8.8*  MG  --  1.9  --  1.8  --   PHOS  --   --   --  3.0  --    Estimated Creatinine Clearance: 28.4 mL/min (A) (by C-G formula based on SCr of 2.04 mg/dL (H)).   LIVER  Recent Labs Lab 06/21/17 1637 06/24/17 0248  AST 13* 21  ALT 31 23  ALKPHOS 178* 154*  BILITOT 1.2 1.3*  PROT 5.7* 5.3*  ALBUMIN 3.1* 2.2*     INFECTIOUS  Recent  Labs Lab 06/24/17 0248 06/24/17 2147 06/25/17 0043  LATICACIDVEN  --  4.3* 3.1*  PROCALCITON 0.48  --   --      ENDOCRINE CBG (last 3)   Recent Labs  06/24/17 1842 06/24/17 2340 06/24/17 2358  GLUCAP 81 63* 96         IMAGING x48h  - image(s) personally visualized  -   highlighted in bold Dg Chest 1 View  Result Date: 06/24/2017 CLINICAL DATA:  Shortness of breath, acute respiratory failure with hypoxia, chronic renal insufficiency, cardiomyopathy, COPD, current smoker. EXAM: CHEST 1 VIEW COMPARISON:  CT scan of the chest and chest x-ray of June 23, 2017 FINDINGS: The lungs are mildly hyperinflated despite the apical lordotic positioning. Hazy increased density in the right perihilar region persists. The retrocardiac region on the left exhibits increased density. There is no pleural effusion or pneumothorax. The cardiac silhouette is enlarged. The pulmonary vascularity is not engorged. The bony thorax is unremarkable. IMPRESSION: Hyperinflation consistent with COPD. Right perihilar and left retrocardiac interstitial density compatible with atelectasis or pneumonia. Cardiomegaly without significant pulmonary vascular congestion or pulmonary edema. No pleural effusion. Electronically Signed   By: David  Martinique M.D.   On: 06/24/2017 07:48   Ct  Head Wo Contrast  Result Date: 06/23/2017 CLINICAL DATA:  51 y/o F; left lower extremity weakness beginning yesterday. EXAM: CT HEAD WITHOUT CONTRAST TECHNIQUE: Contiguous axial images were obtained from the base of the skull through the vertex without intravenous contrast. COMPARISON:  06/22/2017 CT of the head. FINDINGS: Brain: No evidence of acute infarction, hemorrhage, hydrocephalus, extra-axial collection or mass lesion/mass effect. Stable chronic lacunar infarcts within the left thalamus and left caudate head. Stable mild chronic microvascular ischemic changes of white matter parenchymal volume loss of the brain. Vascular: Mild calcific  atherosclerosis of carotid siphons. No hyperdense vessel identified. Skull: Normal. Negative for fracture or focal lesion. Sinuses/Orbits: Chronic left lamina papyracea see a fracture deformity with herniation of extraconal fat. Opacification of left sphenoid sinus. Otherwise negative. Other: None. IMPRESSION: 1. No acute intracranial abnormality identified. 2. Mild chronic microvascular ischemic changes and mild parenchymal volume loss of the brain. Small chronic lacunar infarcts in left thalamus and left caudate head. Electronically Signed   By: Kristine Garbe M.D.   On: 06/23/2017 19:20   Ct Angio Chest Pe W Or Wo Contrast  Result Date: 06/23/2017 CLINICAL DATA:  Dyspnea, question pulmonary embolism, history type II diabetes mellitus, smoking, COPD, essential hypertension, chronic kidney disease, hypertensive cardiomyopathy EXAM: CT ANGIOGRAPHY CHEST WITH CONTRAST TECHNIQUE: Multidetector CT imaging of the chest was performed using the standard protocol during bolus administration of intravenous contrast. Multiplanar CT image reconstructions and MIPs were obtained to evaluate the vascular anatomy. CONTRAST:  80 cc Isovue 370 IV COMPARISON:  CT chest 11/08/2012 FINDINGS: Cardiovascular: Motion artifacts limit assessment of the ascending aorta. Ascending aorta appears upper normal caliber without aneurysm. Suboptimal assessment for dissection due to lack of adequate aortic enhancement. Pulmonary arteries well opacified with mild dilatation of the main pulmonary artery 3.5 cm diameter. No evidence of pulmonary embolism. No pericardial effusion. Mediastinum/Nodes: Esophagus unremarkable. Base of cervical region normal appearance. No thoracic adenopathy. Lungs/Pleura: Patchy infiltrates throughout RIGHT upper and RIGHT lower lobes, minimally in RIGHT middle lobe question pneumonia. Subsegmental atelectasis in the anterior portion of the LEFT lower lobe adjacent to the basilar portion of the major  fissure. No pleural effusion or pneumothorax. Upper Abdomen: BILATERAL adrenal gland thickening without discrete nodule. Remaining visualized upper abdomen unremarkable. Musculoskeletal: No acute osseous findings. Review of the MIP images confirms the above findings. IMPRESSION: No evidence of pulmonary embolism. Patchy infiltrates in RIGHT lung question pneumonia versus asymmetric edema. Subsegmental atelectasis LEFT lower lobe. Enlargement of cardiac chambers with upper normal caliber of the ascending thoracic aorta. Electronically Signed   By: Lavonia Dana M.D.   On: 06/23/2017 19:12   Mr Jodene Nam Head Wo Contrast  Result Date: 06/24/2017 CLINICAL DATA:  Initial evaluation for acute unresponsiveness, stroke follow-up. EXAM: MRI HEAD WITHOUT CONTRAST MRA HEAD WITHOUT CONTRAST TECHNIQUE: Multiplanar, multiecho pulse sequences of the brain and surrounding structures were obtained without intravenous contrast. Angiographic images of the head were obtained using MRA technique without contrast. COMPARISON:  Prior CT from 06/23/2017. FINDINGS: MRI HEAD FINDINGS Brain: Diffuse prominence of the CSF containing spaces compatible with generalized cerebral atrophy. Patchy and confluent T2/FLAIR hyperintensity within the periventricular and deep white matter both cerebral hemispheres most compatible chronic small vessel ischemic disease, moderate nature. Remote lacunar infarcts present within the left thalamus and left basal ganglia. Scattered small remote bilateral cerebellar infarcts present. There are scattered subcentimeter foci of diffusion abnormality involving the bilateral cerebral hemispheres, consistent with small acute ischemic infarcts. These predominantly involve the deep white matter of both  cerebral hemispheres, although there is patchy involvement within the deep gray nuclei as well. Reference purposes, largest focus on the right is positioned within the posterior right centrum semi ovale and measures 6 mm  (series 3, image 37). Largest focus on the left is positioned within the lateral left thalamus and measures 7 mm (series 3, image 28). No associated mass effect or hemorrhage. Sparing of the brainstem and cerebellum noted. Few punctate foci susceptibility fact noted within the left thalamus, consistent with tiny chronic micro hemorrhages, likely hypertensive in nature. No other evidence for acute or chronic intracranial hemorrhage. No mass lesion, midline shift or mass effect. No hydrocephalus. No extra-axial fluid collection. Major dural sinuses are grossly patent. Incidental note made of a partially empty sella. Suprasellar region normal. Vascular: Major intracranial vascular flow voids are maintained. Skull and upper cervical spine: Craniocervical junction within normal limits. Visualized upper cervical spine unremarkable. Bone marrow signal intensity within normal limits. No scalp soft tissue abnormality. Sinuses/Orbits: Globes and orbital soft tissues within normal limits. Remote posttraumatic defect noted at the left lamina papyracea. Paranasal sinuses are clear. Small left mastoid effusion noted, of doubtful significance. Inner ear structures grossly normal. MRA HEAD FINDINGS ANTERIOR CIRCULATION: Study degraded by motion artifact. Distal cervical segments of the internal carotid artery is are patent with antegrade flow. Petrous segments patent bilaterally without flow-limiting stenosis. Atheromatous irregularity within the cavernous ICAs without high-grade stenosis. ICA termini widely patent. A1 segments patent. Anterior communicating artery normal. Anterior cerebral arteries demonstrate multifocal atheromatous irregularity but are patent to their distal aspects. Atheromatous irregularity within the left M1 segment without flow-limiting stenosis. Smooth tapering of the distal right MCA with moderate to severe narrowing. MCA bifurcations within normal limits. Proximal M2 vessels not well evaluated due to  motion, although did do not appear occluded. Atheromatous irregularity within the distal MCA branches bilaterally. POSTERIOR CIRCULATION: Vertebral arteries patent to the vertebrobasilar junction without flow-limiting stenosis. Left vertebral artery slightly dominant. Posterior inferior cerebral artery is not visualized on this exam. Basilar artery tortuous but widely patent to its distal aspect without flow-limiting stenosis. Superior cerebral arteries patent proximally. Both of the posterior cerebral artery supplied via the basilar and are patent to their distal aspects. Multifocal atheromatous irregularity within the PCAs bilaterally without flow-limiting stenosis. No aneurysm or vascular malformation. IMPRESSION: MRI HEAD IMPRESSION: 1. Multifocal subcentimeter acute ischemic infarcts involving the bilateral cerebral hemispheres as above. Findings are most likely embolic in nature. No associated hemorrhage or mass effect. 2. Moderate chronic microvascular ischemic disease with additional remote infarcts involving the left basal ganglia, left thalamus, and bilateral cerebellar hemispheres. MRA HEAD IMPRESSION: 1. Negative intracranial MRA for large vessel occlusion. 2. Moderate atherosclerotic disease involving the anterior posterior circulation as above, most notable at the distal right M1 segment. Electronically Signed   By: Jeannine Boga M.D.   On: 06/24/2017 20:32   Mr Brain Wo Contrast  Result Date: 06/24/2017 CLINICAL DATA:  Initial evaluation for acute unresponsiveness, stroke follow-up. EXAM: MRI HEAD WITHOUT CONTRAST MRA HEAD WITHOUT CONTRAST TECHNIQUE: Multiplanar, multiecho pulse sequences of the brain and surrounding structures were obtained without intravenous contrast. Angiographic images of the head were obtained using MRA technique without contrast. COMPARISON:  Prior CT from 06/23/2017. FINDINGS: MRI HEAD FINDINGS Brain: Diffuse prominence of the CSF containing spaces compatible with  generalized cerebral atrophy. Patchy and confluent T2/FLAIR hyperintensity within the periventricular and deep white matter both cerebral hemispheres most compatible chronic small vessel ischemic disease, moderate nature. Remote lacunar infarcts present within  the left thalamus and left basal ganglia. Scattered small remote bilateral cerebellar infarcts present. There are scattered subcentimeter foci of diffusion abnormality involving the bilateral cerebral hemispheres, consistent with small acute ischemic infarcts. These predominantly involve the deep white matter of both cerebral hemispheres, although there is patchy involvement within the deep gray nuclei as well. Reference purposes, largest focus on the right is positioned within the posterior right centrum semi ovale and measures 6 mm (series 3, image 37). Largest focus on the left is positioned within the lateral left thalamus and measures 7 mm (series 3, image 28). No associated mass effect or hemorrhage. Sparing of the brainstem and cerebellum noted. Few punctate foci susceptibility fact noted within the left thalamus, consistent with tiny chronic micro hemorrhages, likely hypertensive in nature. No other evidence for acute or chronic intracranial hemorrhage. No mass lesion, midline shift or mass effect. No hydrocephalus. No extra-axial fluid collection. Major dural sinuses are grossly patent. Incidental note made of a partially empty sella. Suprasellar region normal. Vascular: Major intracranial vascular flow voids are maintained. Skull and upper cervical spine: Craniocervical junction within normal limits. Visualized upper cervical spine unremarkable. Bone marrow signal intensity within normal limits. No scalp soft tissue abnormality. Sinuses/Orbits: Globes and orbital soft tissues within normal limits. Remote posttraumatic defect noted at the left lamina papyracea. Paranasal sinuses are clear. Small left mastoid effusion noted, of doubtful significance.  Inner ear structures grossly normal. MRA HEAD FINDINGS ANTERIOR CIRCULATION: Study degraded by motion artifact. Distal cervical segments of the internal carotid artery is are patent with antegrade flow. Petrous segments patent bilaterally without flow-limiting stenosis. Atheromatous irregularity within the cavernous ICAs without high-grade stenosis. ICA termini widely patent. A1 segments patent. Anterior communicating artery normal. Anterior cerebral arteries demonstrate multifocal atheromatous irregularity but are patent to their distal aspects. Atheromatous irregularity within the left M1 segment without flow-limiting stenosis. Smooth tapering of the distal right MCA with moderate to severe narrowing. MCA bifurcations within normal limits. Proximal M2 vessels not well evaluated due to motion, although did do not appear occluded. Atheromatous irregularity within the distal MCA branches bilaterally. POSTERIOR CIRCULATION: Vertebral arteries patent to the vertebrobasilar junction without flow-limiting stenosis. Left vertebral artery slightly dominant. Posterior inferior cerebral artery is not visualized on this exam. Basilar artery tortuous but widely patent to its distal aspect without flow-limiting stenosis. Superior cerebral arteries patent proximally. Both of the posterior cerebral artery supplied via the basilar and are patent to their distal aspects. Multifocal atheromatous irregularity within the PCAs bilaterally without flow-limiting stenosis. No aneurysm or vascular malformation. IMPRESSION: MRI HEAD IMPRESSION: 1. Multifocal subcentimeter acute ischemic infarcts involving the bilateral cerebral hemispheres as above. Findings are most likely embolic in nature. No associated hemorrhage or mass effect. 2. Moderate chronic microvascular ischemic disease with additional remote infarcts involving the left basal ganglia, left thalamus, and bilateral cerebellar hemispheres. MRA HEAD IMPRESSION: 1. Negative  intracranial MRA for large vessel occlusion. 2. Moderate atherosclerotic disease involving the anterior posterior circulation as above, most notable at the distal right M1 segment. Electronically Signed   By: Jeannine Boga M.D.   On: 06/24/2017 20:32   Dg Chest Port 1 View  Result Date: 06/23/2017 CLINICAL DATA:  Shortness of breath EXAM: PORTABLE CHEST 1 VIEW COMPARISON:  06/21/2017, 05/25/2016 FINDINGS: Moderate cardiomegaly. No overt pulmonary edema. No pleural effusion or focal consolidation. No pneumothorax. Generous mediastinum, likely due to portable technique and patient rotation IMPRESSION: Moderate cardiomegaly.  No edema or infiltrate. Electronically Signed   By: Madie Reno.D.  On: 06/23/2017 18:12      Results for NAKHIA, LEVITAN (MRN [id number]) as of 06/25/2017 02:19  Ref. Range 04/28/2014 15:11 09/11/2014 13:18 05/27/2016 11:50 06/21/2017 16:50 06/25/2017 01:12  Amphetamines Latest Ref Range: NONE DETECTED  NONE DETECTED NONE DETECTED NONE DETECTED NONE DETECTED NONE DETECTED  Barbiturates Latest Ref Range: NONE DETECTED  NONE DETECTED NONE DETECTED NONE DETECTED NONE DETECTED NONE DETECTED  Benzodiazepines Latest Ref Range: NONE DETECTED  NONE DETECTED NONE DETECTED POSITIVE (A) NONE DETECTED NONE DETECTED  Opiates Latest Ref Range: NONE DETECTED  NONE DETECTED NONE DETECTED NONE DETECTED NONE DETECTED NONE DETECTED  COCAINE Latest Ref Range: NONE DETECTED  POSITIVE (A) NONE DETECTED NONE DETECTED POSITIVE (A) POSITIVE (A)  Tetrahydrocannabinol Latest Ref Range: NONE DETECTED  POSITIVE (A) NONE DETECTED NONE DETECTED NONE DETECTED POSITIVE (A)

## 2017-06-25 NOTE — Progress Notes (Signed)
Name: [name] MRN: [id number] DOB: 04-06-1966    ADMISSION DATE:  06/21/2017 CONSULTATION DATE:  06/23/17  REFERRING MD :  Alfredia Ferguson  CHIEF COMPLAINT:  SOB  HISTORY OF PRESENT ILLNESS:   51 yo female presented with leg pain and dyspnea with pulmonary edema and acute hypoxia.  She has hx of DCM (EF 20-25%), cocaine abuse, tobacco abuse, COPD, DM, CKD, HTN, HTN, HLD, medical non-compliance.   SUBJECTIVE:   Awake and alert, co-operative and asking questions about what happened yesterday. States she thought the staff were aliens yesterday. Wanting to put makeup on this morning.  VITAL SIGNS: BP 95/80   Pulse 85   Temp 98 F (36.7 C) (Axillary)   Resp 16   Ht 5\' 1"  (1.549 m)   Wt 154 lb 5.2 oz (70 kg)   LMP 05/19/2016   SpO2 100%   BMI 29.16 kg/m   INTAKE/OUTPUT: I/O last 3 completed shifts: In: 2823.8 [P.O.:440; I.V.:1083.8; IV Piggyback:1300] Out: 690 [Urine:690]  PHYSICAL EXAMINATION:  General - awake and alert, in no distress, wearing [place] Eyes - pupils equal and reactive ENT - no sinus tenderness, no oral exudate, no LAN Cardiac - SR per monitor, RRR, no rubs Murmur or gallop Chest - no wheeze, rales, + coarse  Abd - soft, non tender, non-distended, BS + Ext - no edema Skin - warm, dry, and intact, no rashes, lesions  Neuro - normal strength, following commands, MAE x 4 Psych - very friendly this morning, loud and demanding   CMP Latest Ref Rng & Units 06/25/2017 06/24/2017 06/24/2017  Glucose 65 - 99 mg/dL 125(H) 71 191(H)  BUN 6 - 20 mg/dL 32(H) 32(H) 26(H)  Creatinine 0.44 - 1.00 mg/dL 1.99(H) 2.04(H) 1.89(H)  Sodium 135 - 145 mmol/L 141 140 140  Potassium 3.5 - 5.1 mmol/L 5.3(H) 6.0(H) 5.0  Chloride 101 - 111 mmol/L 112(H) 109 108  CO2 22 - 32 mmol/L 19(L) 19(L) 23  Calcium 8.9 - 10.3 mg/dL 8.2(L) 8.8(L) 8.5(L)  Total Protein 6.5 - 8.1 g/dL - - 5.3(L)  Total Bilirubin 0.3 - 1.2 mg/dL - - 1.3(H)  Alkaline Phos 38 - 126 U/L - - 154(H)  AST 15 - 41 U/L - -  21  ALT 14 - 54 U/L - - 23    CBC Latest Ref Rng & Units 06/25/2017 06/24/2017 06/23/2017  WBC 4.0 - 10.5 K/uL 18.2(H) 18.0(H) 11.2(H)  Hemoglobin 12.0 - 15.0 g/dL 15.9(H) 13.6 13.2  Hematocrit 36.0 - 46.0 % 49.4(H) 42.7 40.9  Platelets 150 - 400 K/uL 211 213 186    ABG    Component Value Date/Time   PHART 7.313 (L) 06/25/2017 0030   PCO2ART 41.5 06/25/2017 0030   PO2ART 143 (H) 06/25/2017 0030   HCO3 20.6 06/25/2017 0030   TCO2 17 09/11/2014 1152   ACIDBASEDEF 4.7 (H) 06/25/2017 0030   O2SAT 98.1 06/25/2017 0030    CBG (last 3)   Recent Labs  06/24/17 2358 06/25/17 0316 06/25/17 0825  GLUCAP 96 132* 134*    Drugs of Abuse     Component Value Date/Time   LABOPIA NONE DETECTED 06/25/2017 0112   COCAINSCRNUR POSITIVE (A) 06/25/2017 0112   LABBENZ NONE DETECTED 06/25/2017 0112   AMPHETMU NONE DETECTED 06/25/2017 0112   THCU POSITIVE (A) 06/25/2017 0112   LABBARB NONE DETECTED 06/25/2017 0112     Dg Chest 1 View  Result Date: 06/24/2017 CLINICAL DATA:  Shortness of breath, acute respiratory failure with hypoxia, chronic renal insufficiency, cardiomyopathy, COPD, current  smoker. EXAM: CHEST 1 VIEW COMPARISON:  CT scan of the chest and chest x-ray of June 23, 2017 FINDINGS: The lungs are mildly hyperinflated despite the apical lordotic positioning. Hazy increased density in the right perihilar region persists. The retrocardiac region on the left exhibits increased density. There is no pleural effusion or pneumothorax. The cardiac silhouette is enlarged. The pulmonary vascularity is not engorged. The bony thorax is unremarkable. IMPRESSION: Hyperinflation consistent with COPD. Right perihilar and left retrocardiac interstitial density compatible with atelectasis or pneumonia. Cardiomegaly without significant pulmonary vascular congestion or pulmonary edema. No pleural effusion. Electronically Signed   By: David  Martinique M.D.   On: 06/24/2017 07:48   Ct Head Wo  Contrast  Result Date: 06/23/2017 CLINICAL DATA:  51 y/o F; left lower extremity weakness beginning yesterday. EXAM: CT HEAD WITHOUT CONTRAST TECHNIQUE: Contiguous axial images were obtained from the base of the skull through the vertex without intravenous contrast. COMPARISON:  06/22/2017 CT of the head. FINDINGS: Brain: No evidence of acute infarction, hemorrhage, hydrocephalus, extra-axial collection or mass lesion/mass effect. Stable chronic lacunar infarcts within the left thalamus and left caudate head. Stable mild chronic microvascular ischemic changes of white matter parenchymal volume loss of the brain. Vascular: Mild calcific atherosclerosis of carotid siphons. No hyperdense vessel identified. Skull: Normal. Negative for fracture or focal lesion. Sinuses/Orbits: Chronic left lamina papyracea see a fracture deformity with herniation of extraconal fat. Opacification of left sphenoid sinus. Otherwise negative. Other: None. IMPRESSION: 1. No acute intracranial abnormality identified. 2. Mild chronic microvascular ischemic changes and mild parenchymal volume loss of the brain. Small chronic lacunar infarcts in left thalamus and left caudate head. Electronically Signed   By: Kristine Garbe M.D.   On: 06/23/2017 19:20   Ct Angio Chest Pe W Or Wo Contrast  Result Date: 06/23/2017 CLINICAL DATA:  Dyspnea, question pulmonary embolism, history type II diabetes mellitus, smoking, COPD, essential hypertension, chronic kidney disease, hypertensive cardiomyopathy EXAM: CT ANGIOGRAPHY CHEST WITH CONTRAST TECHNIQUE: Multidetector CT imaging of the chest was performed using the standard protocol during bolus administration of intravenous contrast. Multiplanar CT image reconstructions and MIPs were obtained to evaluate the vascular anatomy. CONTRAST:  80 cc Isovue 370 IV COMPARISON:  CT chest 11/08/2012 FINDINGS: Cardiovascular: Motion artifacts limit assessment of the ascending aorta. Ascending aorta appears  upper normal caliber without aneurysm. Suboptimal assessment for dissection due to lack of adequate aortic enhancement. Pulmonary arteries well opacified with mild dilatation of the main pulmonary artery 3.5 cm diameter. No evidence of pulmonary embolism. No pericardial effusion. Mediastinum/Nodes: Esophagus unremarkable. Base of cervical region normal appearance. No thoracic adenopathy. Lungs/Pleura: Patchy infiltrates throughout RIGHT upper and RIGHT lower lobes, minimally in RIGHT middle lobe question pneumonia. Subsegmental atelectasis in the anterior portion of the LEFT lower lobe adjacent to the basilar portion of the major fissure. No pleural effusion or pneumothorax. Upper Abdomen: BILATERAL adrenal gland thickening without discrete nodule. Remaining visualized upper abdomen unremarkable. Musculoskeletal: No acute osseous findings. Review of the MIP images confirms the above findings. IMPRESSION: No evidence of pulmonary embolism. Patchy infiltrates in RIGHT lung question pneumonia versus asymmetric edema. Subsegmental atelectasis LEFT lower lobe. Enlargement of cardiac chambers with upper normal caliber of the ascending thoracic aorta. Electronically Signed   By: Lavonia Dana M.D.   On: 06/23/2017 19:12   Mr Jodene Nam Head Wo Contrast  Result Date: 06/24/2017 CLINICAL DATA:  Initial evaluation for acute unresponsiveness, stroke follow-up. EXAM: MRI HEAD WITHOUT CONTRAST MRA HEAD WITHOUT CONTRAST TECHNIQUE: Multiplanar, multiecho pulse sequences  of the brain and surrounding structures were obtained without intravenous contrast. Angiographic images of the head were obtained using MRA technique without contrast. COMPARISON:  Prior CT from 06/23/2017. FINDINGS: MRI HEAD FINDINGS Brain: Diffuse prominence of the CSF containing spaces compatible with generalized cerebral atrophy. Patchy and confluent T2/FLAIR hyperintensity within the periventricular and deep white matter both cerebral hemispheres most compatible  chronic small vessel ischemic disease, moderate nature. Remote lacunar infarcts present within the left thalamus and left basal ganglia. Scattered small remote bilateral cerebellar infarcts present. There are scattered subcentimeter foci of diffusion abnormality involving the bilateral cerebral hemispheres, consistent with small acute ischemic infarcts. These predominantly involve the deep white matter of both cerebral hemispheres, although there is patchy involvement within the deep gray nuclei as well. Reference purposes, largest focus on the right is positioned within the posterior right centrum semi ovale and measures 6 mm (series 3, image 37). Largest focus on the left is positioned within the lateral left thalamus and measures 7 mm (series 3, image 28). No associated mass effect or hemorrhage. Sparing of the brainstem and cerebellum noted. Few punctate foci susceptibility fact noted within the left thalamus, consistent with tiny chronic micro hemorrhages, likely hypertensive in nature. No other evidence for acute or chronic intracranial hemorrhage. No mass lesion, midline shift or mass effect. No hydrocephalus. No extra-axial fluid collection. Major dural sinuses are grossly patent. Incidental note made of a partially empty sella. Suprasellar region normal. Vascular: Major intracranial vascular flow voids are maintained. Skull and upper cervical spine: Craniocervical junction within normal limits. Visualized upper cervical spine unremarkable. Bone marrow signal intensity within normal limits. No scalp soft tissue abnormality. Sinuses/Orbits: Globes and orbital soft tissues within normal limits. Remote posttraumatic defect noted at the left lamina papyracea. Paranasal sinuses are clear. Small left mastoid effusion noted, of doubtful significance. Inner ear structures grossly normal. MRA HEAD FINDINGS ANTERIOR CIRCULATION: Study degraded by motion artifact. Distal cervical segments of the internal carotid artery  is are patent with antegrade flow. Petrous segments patent bilaterally without flow-limiting stenosis. Atheromatous irregularity within the cavernous ICAs without high-grade stenosis. ICA termini widely patent. A1 segments patent. Anterior communicating artery normal. Anterior cerebral arteries demonstrate multifocal atheromatous irregularity but are patent to their distal aspects. Atheromatous irregularity within the left M1 segment without flow-limiting stenosis. Smooth tapering of the distal right MCA with moderate to severe narrowing. MCA bifurcations within normal limits. Proximal M2 vessels not well evaluated due to motion, although did do not appear occluded. Atheromatous irregularity within the distal MCA branches bilaterally. POSTERIOR CIRCULATION: Vertebral arteries patent to the vertebrobasilar junction without flow-limiting stenosis. Left vertebral artery slightly dominant. Posterior inferior cerebral artery is not visualized on this exam. Basilar artery tortuous but widely patent to its distal aspect without flow-limiting stenosis. Superior cerebral arteries patent proximally. Both of the posterior cerebral artery supplied via the basilar and are patent to their distal aspects. Multifocal atheromatous irregularity within the PCAs bilaterally without flow-limiting stenosis. No aneurysm or vascular malformation. IMPRESSION: MRI HEAD IMPRESSION: 1. Multifocal subcentimeter acute ischemic infarcts involving the bilateral cerebral hemispheres as above. Findings are most likely embolic in nature. No associated hemorrhage or mass effect. 2. Moderate chronic microvascular ischemic disease with additional remote infarcts involving the left basal ganglia, left thalamus, and bilateral cerebellar hemispheres. MRA HEAD IMPRESSION: 1. Negative intracranial MRA for large vessel occlusion. 2. Moderate atherosclerotic disease involving the anterior posterior circulation as above, most notable at the distal right M1  segment. Electronically Signed   By: Marland Kitchen  Jeannine Boga M.D.   On: 06/24/2017 20:32   Mr Brain Wo Contrast  Result Date: 06/24/2017 CLINICAL DATA:  Initial evaluation for acute unresponsiveness, stroke follow-up. EXAM: MRI HEAD WITHOUT CONTRAST MRA HEAD WITHOUT CONTRAST TECHNIQUE: Multiplanar, multiecho pulse sequences of the brain and surrounding structures were obtained without intravenous contrast. Angiographic images of the head were obtained using MRA technique without contrast. COMPARISON:  Prior CT from 06/23/2017. FINDINGS: MRI HEAD FINDINGS Brain: Diffuse prominence of the CSF containing spaces compatible with generalized cerebral atrophy. Patchy and confluent T2/FLAIR hyperintensity within the periventricular and deep white matter both cerebral hemispheres most compatible chronic small vessel ischemic disease, moderate nature. Remote lacunar infarcts present within the left thalamus and left basal ganglia. Scattered small remote bilateral cerebellar infarcts present. There are scattered subcentimeter foci of diffusion abnormality involving the bilateral cerebral hemispheres, consistent with small acute ischemic infarcts. These predominantly involve the deep white matter of both cerebral hemispheres, although there is patchy involvement within the deep gray nuclei as well. Reference purposes, largest focus on the right is positioned within the posterior right centrum semi ovale and measures 6 mm (series 3, image 37). Largest focus on the left is positioned within the lateral left thalamus and measures 7 mm (series 3, image 28). No associated mass effect or hemorrhage. Sparing of the brainstem and cerebellum noted. Few punctate foci susceptibility fact noted within the left thalamus, consistent with tiny chronic micro hemorrhages, likely hypertensive in nature. No other evidence for acute or chronic intracranial hemorrhage. No mass lesion, midline shift or mass effect. No hydrocephalus. No extra-axial  fluid collection. Major dural sinuses are grossly patent. Incidental note made of a partially empty sella. Suprasellar region normal. Vascular: Major intracranial vascular flow voids are maintained. Skull and upper cervical spine: Craniocervical junction within normal limits. Visualized upper cervical spine unremarkable. Bone marrow signal intensity within normal limits. No scalp soft tissue abnormality. Sinuses/Orbits: Globes and orbital soft tissues within normal limits. Remote posttraumatic defect noted at the left lamina papyracea. Paranasal sinuses are clear. Small left mastoid effusion noted, of doubtful significance. Inner ear structures grossly normal. MRA HEAD FINDINGS ANTERIOR CIRCULATION: Study degraded by motion artifact. Distal cervical segments of the internal carotid artery is are patent with antegrade flow. Petrous segments patent bilaterally without flow-limiting stenosis. Atheromatous irregularity within the cavernous ICAs without high-grade stenosis. ICA termini widely patent. A1 segments patent. Anterior communicating artery normal. Anterior cerebral arteries demonstrate multifocal atheromatous irregularity but are patent to their distal aspects. Atheromatous irregularity within the left M1 segment without flow-limiting stenosis. Smooth tapering of the distal right MCA with moderate to severe narrowing. MCA bifurcations within normal limits. Proximal M2 vessels not well evaluated due to motion, although did do not appear occluded. Atheromatous irregularity within the distal MCA branches bilaterally. POSTERIOR CIRCULATION: Vertebral arteries patent to the vertebrobasilar junction without flow-limiting stenosis. Left vertebral artery slightly dominant. Posterior inferior cerebral artery is not visualized on this exam. Basilar artery tortuous but widely patent to its distal aspect without flow-limiting stenosis. Superior cerebral arteries patent proximally. Both of the posterior cerebral artery  supplied via the basilar and are patent to their distal aspects. Multifocal atheromatous irregularity within the PCAs bilaterally without flow-limiting stenosis. No aneurysm or vascular malformation. IMPRESSION: MRI HEAD IMPRESSION: 1. Multifocal subcentimeter acute ischemic infarcts involving the bilateral cerebral hemispheres as above. Findings are most likely embolic in nature. No associated hemorrhage or mass effect. 2. Moderate chronic microvascular ischemic disease with additional remote infarcts involving the left basal ganglia, left thalamus,  and bilateral cerebellar hemispheres. MRA HEAD IMPRESSION: 1. Negative intracranial MRA for large vessel occlusion. 2. Moderate atherosclerotic disease involving the anterior posterior circulation as above, most notable at the distal right M1 segment. Electronically Signed   By: Jeannine Boga M.D.   On: 06/24/2017 20:32   Dg Chest Port 1 View  Result Date: 06/23/2017 CLINICAL DATA:  Shortness of breath EXAM: PORTABLE CHEST 1 VIEW COMPARISON:  06/21/2017, 05/25/2016 FINDINGS: Moderate cardiomegaly. No overt pulmonary edema. No pleural effusion or focal consolidation. No pneumothorax. Generous mediastinum, likely due to portable technique and patient rotation IMPRESSION: Moderate cardiomegaly.  No edema or infiltrate. Electronically Signed   By: Donavan Foil M.D.   On: 06/23/2017 18:12    STUDIES:  MRI L spine 8/27 > L3-4 and L4-5 anterolisthesis, L5-S1 retrolisthesis.  No spondylolysis.  Degenerative changes of L spine with L3-4 disc extrusion.  Severe canal stenosis L3-4 with disorganized cauda equina, mild canal stenosis L4-5.  Severe neural foraminal narrowing L3-4 through L5-S1.  Progressive scalloping L3 posterior verterbral body suggesting chronic venous changes. CT head 8/28 > no acute process. Remote lacunar infarcts. Echo 8/28 > EF 30 to 35%, mod LVH, grade 2 DD, mod MR CT head 8/29 > chronic microvascular changes, chronic lacunar infarcts in  Lt thalamus and Lt caudate head CTA chest 8/29 > patchy infiltrated Rt lung CXR 8/30:Right perihilar and left retrocardiac interstitial density compatible with atelectasis or pneumonia. Cardiomegaly without  pulmonary vascular Congestion/pulmonary edema. No pleural effusion  DISCUSSION: 51 yo female smoker with acute hypoxic respiratory failure 2nd to cocaine use with acute pulmonary edema in setting of COPD and non ischemic CM.  ASSESSMENT / PLAN:  Acute hypoxic respiratory failure.>> Improving with improved mental status  - titrate oxygen to keep SpO2 > 92% - BiPAP as needed - CXR prn  Non ischemic CM. - negative fluid balance as able - Tele monitoring  Mixed Metabolic/ Resp Acidosis - Fluid bolus as needed for pressure/ lactic acid - BiPap as needed - Trend BMET/ Lactic Acid/ ABG as needed - Narcan as needed  Hyperkalemia - Kayexelate as needed - BMET at 1600 8/31 - Monitor BMET - Tele monitoring   Rt lung infiltrate.>> Leukocytosis>>Afebrile  - ? pneumonia versus cocaine use - Continue cefepime  - Trend fever curve - CXR 9/1  COPD with tobacco abuse. - smoking cessation  - Continue breathing treatments as needed  Encephalopathy>> Improving - EEG as ordered - Monitor for seizures  Cocaine abuse. - needs to stop using -Social Work Consult - Monitor family while visiting with patient for any suspicious activity ( providing meds/ drugs)  Pt. Is improving, respirations are regular and unlabored. We will need to monitor interactions with family. Will recheck lactate for clearing after fluid bolus, and K. Will transfer back to [facility] as she has improved, is awake and alert, and protecting her airway.    Magdalen Spatz, AGACNP-BC [facility] 06/25/2017, 8:30 AM Pager:  [phone]
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
## 2017-06-25 NOTE — Progress Notes (Signed)
Pt taken off Bipap for neb and MDI treatment.  Pt tolerating [place] well at this time. RN in room.

## 2017-06-25 NOTE — Progress Notes (Signed)
MD notified lactic Acid 2.6

## 2017-06-25 NOTE — Progress Notes (Signed)
*  PRELIMINARY RESULTS* Vascular Ultrasound Carotid duplex has been completed.  Preliminary findings: Bilateral: No significant (1-39%) ICA stenosis. Antegrade vertebral flow.   Landry Mellow, RDMS, RVT  06/25/2017, 12:14 PM

## 2017-06-25 NOTE — Progress Notes (Signed)
STROKE TEAM PROGRESS NOTE   HISTORY OF PRESENT ILLNESS (per record) [name] is an 51 y.o. female with a history of ETOH abuse, CKD, COPD, DM2, hypertension, hyperlipidemia, hypertensive cardiomyopathy, obesity, tobacco abuse, and poor medication compliance who was initially admitted for back pain, and then transferred to the ICU after having periods of unresponsiveness on the floor.  Much of the history is obtained from the nurse as the patient is unwilling to talk. Apparently she was very vocal this morning and talking on the phone with family members and then suddenly became somewhat hypotensive and since then has not been localizing or following commands. Upon walking in the room patient is somnolent, blood pressure is soft at 98/60. Upon calling her name she quickly awakens but she will not answer my questions. As I'm asking questions she will close her eyes. I will sternal rub her chest and she quickly awakens reaching for my hands and pushing me away. She is able to track my fingers however she takes time to do this and at times her stare at me. I continually needed to do sternal rub to get her to pay attention to me however she still would not follow any commands or answer any questions. Nurse tech who is in the room states that she is multiple times holding her breath and then will start breathing. Currently as stated above she is moving all her extremities 5 out of 5 strength when continually stimulated with noxious stimuli she very quickly with good strength pushes me away and at times his leave me alone.  The only medication that could be sedating that she has had his tramadol other than that patient has not received any sedating medications all day today.  Last known well: Unclear TPA given: No, unclear time of onset  SUBJECTIVE (INTERVAL HISTORY) Her daughter is at the bedside.  She is awake alert and following all the commands. Overnight she had hypotension and response to fluid. EEG  mild diffuse slowing, no seizure seen. A1C 15.5 and pt admitted that she did not take his medication as she should.    OBJECTIVE Temp:  [97.6 F (36.4 C)-99.6 F (37.6 C)] 97.8 F (36.6 C) (08/31 1700) Pulse Rate:  [44-96] 84 (08/31 1700) Cardiac Rhythm: Normal sinus rhythm (08/30 2000) Resp:  [0-27] 16 (08/31 1700) BP: (56-127)/(48-106) 107/78 (08/31 1700) SpO2:  [83 %-100 %] 100 % (08/31 1700) FiO2 (%):  [30 %] 30 % (08/31 0054) Weight:  [153 lb 3.5 oz (69.5 kg)-154 lb 5.2 oz (70 kg)] 154 lb 5.2 oz (70 kg) (08/31 0424)  CBC:   Recent Labs Lab 06/24/17 0248 06/25/17 0248  WBC 18.0* 18.2*  NEUTROABS 15.4*  --   HGB 13.6 15.9*  HCT 42.7 49.4*  MCV 98.2 100.4*  PLT 213 222    Basic Metabolic Panel:  Recent Labs Lab 06/22/17 0337  06/24/17 0248  06/25/17 0557 06/25/17 1547  NA 140  < > 140  < > 141 140  K 3.2*  < > 5.0  < > 5.3* 4.5  CL 107  < > 108  < > 112* 108  CO2 26  < > 23  < > 19* 21*  GLUCOSE 107*  < > 191*  < > 125* 315*  BUN 20  < > 26*  < > 32* 31*  CREATININE 1.36*  < > 1.89*  < > 1.99* 2.02*  CALCIUM 8.2*  < > 8.5*  < > 8.2* 8.1*  MG 1.9  --  1.8  --   --   --   PHOS  --   --  3.0  --   --   --   < > = values in this interval not displayed.  Lipid Panel:     Component Value Date/Time   CHOL 164 06/24/2017 2147   TRIG 87 06/24/2017 2147   HDL 51 06/24/2017 2147   CHOLHDL 3.2 06/24/2017 2147   VLDL 17 06/24/2017 2147   LDLCALC 96 06/24/2017 2147   HgbA1c:  Lab Results  Component Value Date   HGBA1C >15.5 (H) 06/22/2017   Urine Drug Screen:     Component Value Date/Time   LABOPIA NONE DETECTED 06/25/2017 0112   COCAINSCRNUR POSITIVE (A) 06/25/2017 0112   LABBENZ NONE DETECTED 06/25/2017 0112   AMPHETMU NONE DETECTED 06/25/2017 0112   THCU POSITIVE (A) 06/25/2017 0112   LABBARB NONE DETECTED 06/25/2017 0112    Alcohol Level     Component Value Date/Time   ETH <5 06/21/2017 1637    IMAGING I have personally reviewed the  radiological images below and agree with the radiology interpretations.  Ct Head Wo Contrast 06/23/2017 IMPRESSION: 1. No acute intracranial abnormality identified. 2. Mild chronic microvascular ischemic changes and mild parenchymal volume loss of the brain. Small chronic lacunar infarcts in left thalamus and left caudate head.   Ct Angio Chest Pe W Or Wo Contrast 06/23/2017 IMPRESSION: No evidence of pulmonary embolism. Patchy infiltrates in RIGHT lung question pneumonia versus asymmetric edema. Subsegmental atelectasis LEFT lower lobe. Enlargement of cardiac chambers with upper normal caliber of the ascending thoracic aorta.    Mri and Mra Brain Wo Contrast 06/24/2017 IMPRESSION: MRI HEAD IMPRESSION: 1. Multifocal subcentimeter acute ischemic infarcts involving the bilateral cerebral hemispheres as above. Findings are most likely embolic in nature. No associated hemorrhage or mass effect. 2. Moderate chronic microvascular ischemic disease with additional remote infarcts involving the left basal ganglia, left thalamus, and bilateral cerebellar hemispheres. MRA HEAD IMPRESSION: 1. Negative intracranial MRA for large vessel occlusion. 2. Moderate atherosclerotic disease involving the anterior posterior circulation as above, most notable at the distal right M1 segment.   Dg Chest Port 1 View 06/23/2017  Moderate cardiomegaly.  No edema or infiltrate.   CUS - Bilateral: No significant (1-39%) ICA stenosis. Antegrade vertebral flow.   EEG - This awake and drowsy EEG is mildly abnormal due to mild diffuse slowing.   The above finding is indicative of diffuse cerebral dysfunction, which is nonspecific and may be secondary to hypoxic, toxic-metabolic or other diffuse physiologic abnormality.  Clinical correlation advised.   PHYSICAL EXAM  Temp:  [97.6 F (36.4 C)-99.6 F (37.6 C)] 97.8 F (36.6 C) (08/31 1700) Pulse Rate:  [44-96] 84 (08/31 1700) Resp:  [0-27] 16 (08/31 1700) BP:  (56-127)/(48-106) 107/78 (08/31 1700) SpO2:  [83 %-100 %] 100 % (08/31 1700) FiO2 (%):  [30 %] 30 % (08/31 0054) Weight:  [153 lb 3.5 oz (69.5 kg)-154 lb 5.2 oz (70 kg)] 154 lb 5.2 oz (70 kg) (08/31 0424)  General - Well nourished, well developed, in no apparent distress.  Ophthalmologic - Fundi not visualized due to noncooperation.  Cardiovascular - Regular rate and rhythm.  Mental Status -  Level of arousal and orientation to time, place, and person were intact. Language including expression, naming, repetition, comprehension was assessed and found intact, mild dysarthria.  Cranial Nerves II - XII - II - Visual field intact OU. III, IV, VI - Extraocular movements intact. V - Facial sensation  intact bilaterally. VII - Facial movement intact bilaterally. VIII - Hearing & vestibular intact bilaterally. X - Palate elevates symmetrically, mild dysarthria. XI - Chin turning & shoulder shrug intact bilaterally. XII - Tongue protrusion intact.  Motor Strength - The patient's strength was normal in all extremities and pronator drift was absent.  Bulk was normal and fasciculations were absent.   Motor Tone - Muscle tone was assessed at the neck and appendages and was normal.  Reflexes - The patient's reflexes were 1+ in all extremities and she had no pathological reflexes.  Sensory - Light touch, temperature/pinprick were assessed and were symmetrical.    Coordination - The patient had normal movements in the hands with no ataxia or dysmetria.  Tremor was absent.  Gait and Station - deferred.   ASSESSMENT/PLAN [name] is a 51 y.o. female with history of COPD, DM, HLD, HTN, smoker, CHF, CKD, alcohol abuse, breast Ca presenting with back pain -> AMS-> ICU -> aphasia. Intermittent low BP. MRI showed watershed pattern infarcts  Stroke:  bilateral watershed infarcts including b/l MCA/ACA, right MCA/PCA, right M1/M2, left thalamus and left caudate head, likely due to  hypotension in the setting of b/l intracranial stenosis. Cardioembolic infarcts are in the ddx - EF 30-35%  Resultant  Deficit resolved  CT head Small chronic lacunar infarcts in left thalamus and left caudate head.   MRI head  bilateral watershed infarcts including b/l MCA/ACA, right MCA/PCA, right M1/M2, left thalamus and left caudate head  MRA head b/l MCA and b/l PCA stenosis  Carotid Doppler  unremarkable  2D Echo  EF 30-35%  EEG diffuse slowing no seizure  LDL 96  HgbA1c > 15.5  lovenox for VTE prophylaxis Diet heart healthy/carb modified Room service appropriate? Yes; Fluid consistency: Thin  clopidogrel 75 mg daily prior to admission, now on aspirin 81 mg daily and clopidogrel 75 mg daily. Continue DAPT for 3 months and then plavix alone.   Patient counseled to be compliant with her antithrombotic medications  Ongoing aggressive stroke risk factor management  Therapy recommendations:  pending  Disposition:  pending  Hypertension/hypotension  Unstable, fluctuate  Long-term BP goal normotensive  Hyperlipidemia  Home meds:  crestor, resumed in hospital  LDL 96, goal < 70  Also on zetia, fenofibrate  Continue statin at discharge  Diabetes  HgbA1c >15.5, goal < 7.0  Uncontrolled  DM education  SSI  Cocaine abuse  UDS showed cocaine positive  Cessation counseling provided  Pt is willing to quit  Tobacco abuse  Current smoker  Smoking cessation counseling provided  Nicotine patch provided  Pt is willing to quit  Alcohol abuse  FA/B1/MVI   advised to drink no more than 1 drink a day  Other Stroke Risk Factors  Advanced age  CHF EF 30-35%  Other Active Problems  Breast cancer  COPD  Hospital day # 4  Neurology will sign off. Please call with questions. Pt will follow up with Cecille Rubin, NP, at [facility] in about 6 weeks. Thanks for the consult.  Rosalin Hawking, MD PhD Stroke Neurology 06/25/2017 7:35 PM    To contact  Stroke Continuity provider, please refer to [email or web address] After hours, contact General Neurology

## 2017-06-25 NOTE — Progress Notes (Signed)
Bedside EEG completed, results pending. 

## 2017-06-25 NOTE — Progress Notes (Signed)
Pt [id number]-[id number]. Pt asymptomatic. Md paged for sliding scale order.

## 2017-06-26 ENCOUNTER — Inpatient Hospital Stay (HOSPITAL_COMMUNITY): Payer: Medicare Other

## 2017-06-26 DIAGNOSIS — I428 Other cardiomyopathies: Secondary | ICD-10-CM

## 2017-06-26 DIAGNOSIS — I110 Hypertensive heart disease with heart failure: Secondary | ICD-10-CM

## 2017-06-26 DIAGNOSIS — I5022 Chronic systolic (congestive) heart failure: Secondary | ICD-10-CM

## 2017-06-26 LAB — CBC WITH DIFFERENTIAL/PLATELET
Basophils Absolute: 0 10*3/uL (ref 0.0–0.1)
Basophils Relative: 0 %
Eosinophils Absolute: 0 10*3/uL (ref 0.0–0.7)
Eosinophils Relative: 0 %
HCT: 40.7 % (ref 36.0–46.0)
Hemoglobin: 13.2 g/dL (ref 12.0–15.0)
Lymphocytes Relative: 6 %
Lymphs Abs: 0.8 10*3/uL (ref 0.7–4.0)
MCH: 31.6 pg (ref 26.0–34.0)
MCHC: 32.4 g/dL (ref 30.0–36.0)
MCV: 97.4 fL (ref 78.0–100.0)
Monocytes Absolute: 0.7 10*3/uL (ref 0.1–1.0)
Monocytes Relative: 6 %
Neutro Abs: 11.8 10*3/uL — ABNORMAL HIGH (ref 1.7–7.7)
Neutrophils Relative %: 88 %
Platelets: 212 10*3/uL (ref 150–400)
RBC: 4.18 MIL/uL (ref 3.87–5.11)
RDW: 14.9 % (ref 11.5–15.5)
WBC: 13.3 10*3/uL — ABNORMAL HIGH (ref 4.0–10.5)

## 2017-06-26 LAB — GLUCOSE, CAPILLARY
Glucose-Capillary: 156 mg/dL — ABNORMAL HIGH (ref 65–99)
Glucose-Capillary: 191 mg/dL — ABNORMAL HIGH (ref 65–99)
Glucose-Capillary: 165 mg/dL — ABNORMAL HIGH (ref 65–99)
Glucose-Capillary: 102 mg/dL — ABNORMAL HIGH (ref 65–99)
Glucose-Capillary: 62 mg/dL — ABNORMAL LOW (ref 65–99)

## 2017-06-26 LAB — BASIC METABOLIC PANEL
Anion gap: 10 (ref 5–15)
BUN: 27 mg/dL — ABNORMAL HIGH (ref 6–20)
CO2: 23 mmol/L (ref 22–32)
Calcium: 8.2 mg/dL — ABNORMAL LOW (ref 8.9–10.3)
Chloride: 108 mmol/L (ref 101–111)
Creatinine, Ser: 1.82 mg/dL — ABNORMAL HIGH (ref 0.44–1.00)
GFR calc Af Amer: 35 mL/min — ABNORMAL LOW (ref >60)
GFR calc non Af Amer: 31 mL/min — ABNORMAL LOW (ref >60)
Glucose, Bld: 196 mg/dL — ABNORMAL HIGH (ref 65–99)
Potassium: 4.1 mmol/L (ref 3.5–5.1)
Sodium: 141 mmol/L (ref 135–145)

## 2017-06-26 LAB — LACTIC ACID, PLASMA
Lactic Acid, Venous: 2.9 mmol/L (ref 0.5–1.9)
Lactic Acid, Venous: 2.7 mmol/L (ref 0.5–1.9)

## 2017-06-26 LAB — MAGNESIUM: Magnesium: 1.6 mg/dL — ABNORMAL LOW (ref 1.7–2.4)

## 2017-06-26 LAB — PHOSPHORUS: Phosphorus: 2.9 mg/dL (ref 2.5–4.6)

## 2017-06-26 MED ORDER — SODIUM CHLORIDE 0.9 % IV BOLUS (SEPSIS)
250.0000 mL | Freq: Once | INTRAVENOUS | Status: AC
  Administered 2017-06-26: 250 mL via INTRAVENOUS

## 2017-06-26 MED ORDER — DEXTROSE 50 % IV SOLN
INTRAVENOUS | Status: AC
  Administered 2017-06-26: 25 mL
  Filled 2017-06-26: qty 50

## 2017-06-26 MED ORDER — LORAZEPAM 2 MG/ML IJ SOLN
1.0000 mg | Freq: Four times a day (QID) | INTRAMUSCULAR | Status: AC | PRN
  Administered 2017-06-26: 1 mg via INTRAVENOUS
  Filled 2017-06-26: qty 1

## 2017-06-26 MED ORDER — ADULT MULTIVITAMIN W/MINERALS CH
1.0000 | ORAL_TABLET | Freq: Every day | ORAL | Status: DC
  Administered 2017-06-27 – 2017-06-30 (×4): 1 via ORAL
  Filled 2017-06-26 (×4): qty 1

## 2017-06-26 MED ORDER — THIAMINE HCL 100 MG/ML IJ SOLN
100.0000 mg | Freq: Every day | INTRAMUSCULAR | Status: DC
  Administered 2017-06-28: 100 mg via INTRAVENOUS
  Filled 2017-06-26 (×2): qty 2

## 2017-06-26 MED ORDER — LORAZEPAM 2 MG/ML IJ SOLN
0.0000 mg | Freq: Two times a day (BID) | INTRAMUSCULAR | Status: AC
  Filled 2017-06-26: qty 1

## 2017-06-26 MED ORDER — MAGNESIUM SULFATE IN D5W 1-5 GM/100ML-% IV SOLN
1.0000 g | Freq: Once | INTRAVENOUS | Status: AC
  Administered 2017-06-26: 1 g via INTRAVENOUS
  Filled 2017-06-26: qty 100

## 2017-06-26 MED ORDER — VITAMIN B-1 100 MG PO TABS
100.0000 mg | ORAL_TABLET | Freq: Every day | ORAL | Status: DC
  Administered 2017-06-27 – 2017-06-30 (×3): 100 mg via ORAL
  Filled 2017-06-26 (×3): qty 1

## 2017-06-26 MED ORDER — LORAZEPAM 2 MG/ML IJ SOLN
0.0000 mg | Freq: Four times a day (QID) | INTRAMUSCULAR | Status: AC
  Administered 2017-06-27: 0.5 mg via INTRAVENOUS
  Filled 2017-06-26: qty 1

## 2017-06-26 MED ORDER — LIDOCAINE 5 % EX PTCH
1.0000 | MEDICATED_PATCH | CUTANEOUS | Status: DC
  Administered 2017-06-26 – 2017-06-30 (×5): 1 via TRANSDERMAL
  Filled 2017-06-26 (×5): qty 1

## 2017-06-26 MED ORDER — FOLIC ACID 1 MG PO TABS
1.0000 mg | ORAL_TABLET | Freq: Every day | ORAL | Status: DC
  Administered 2017-06-27 – 2017-06-30 (×4): 1 mg via ORAL
  Filled 2017-06-26 (×4): qty 1

## 2017-06-26 MED ORDER — LORAZEPAM 1 MG PO TABS: 1.0000 mg | ORAL_TABLET | Freq: Four times a day (QID) | ORAL | Status: AC | PRN

## 2017-06-26 NOTE — Progress Notes (Signed)
PROGRESS NOTE    VERNICA [name]  TKW:[id number] DOB: Jan 04, 1966 DOA: 06/21/2017 PCP: [name], MD   Brief Narrative:  [name] a 51 y.o.femalewith medical history significant of dilated cardiomyopathy with previous ejection fraction of 20-25% now 30-35%, diabetes mellitus type 2, uncontrolled with chronic kidney disease stage III, uncontrolled hypertension, cocaine abuse, tobacco abuse, chronic obstructive pulmonary disease, hyperlipidemia, and medical noncompliance who presents the emergency department stating that she can no longer walk her legs won't hold her up. Patient states that she has not been taking any of her medications for her heart in 2 months. She stopped her diabetes medications 2 weeks ago. She lives by herself in a motel. Home health was coming to help her until the home health company went out of business. She is usually able to walk over the last couple of days she's become increasingly weak late last night or very early this morning she was unable to get up off the commode because of bilateral leg weakness. She slid to the ground and crawled out to the main area of the motel room where she remained in a crosslegged sitting pattern until family came approximately 12 hours later. She didn't call 911 because she did not want to come to the hospital.   Ultimately when family found her and they convinced her come into the emergency department. She has complaints of increased swelling in her legs (not taking her Lasix) and shortness of breath. She feels generally ill which has been progressive and now become severe. She denies any fevers. She has had poor urine output recently not making much urine at all. She has increased thirst. She has no chest pain she denies palpitations. She says she has serious chronic low back pain and this has not worsened but the weakness is severe. Also for past few days she has been having intermittent myoclonic jerking which lasted  about 30 seconds and resolves. She has no other shaking. He has made her symptoms better nothing has made them worse. Symptoms have been chronic and worsening since they developed. She was admitted for Elevated Troponin, CHF Exacerbation, and Back Pain with inability to walk.   On the afternoon of 06/23/17 a Rapid Response was called as the patient became Hypoxic and desaturated to 79% on 6 Liters. Patient was also Hypotensive and confused. She was bolused 250 mL of NS and had her Antihypertensives stopped. Saturations eventually came up.  An ABG was obtained which was unremarkable, a Stat Portable CXR which was normal, and patient was also given 1x of Narcan. Patient was placed on CIWA protocol, ordered a Head CT Scan for her confusion/sleepiness and was also ordered a CTA Chest to r/o PE (reduced Contrast dose). PCCM was consulted to evaluate for patient's Hypoxia episode and patient was transferred to SDU.  On 06/24/17 patient was doing well and this afternoon nurse paged stating the patient became confused again and dropped her BP. Patient's BP improved after a bolus was ordered and ABG done was unremarkable. Because the patient's mentation had changed I went to the bedside and it was very difficult to arouse her so I ordered an EEG and MRI of the Head. Neurology was consulted and evaluated and took the patient down for a STAT MRI and patient was found to have multiple strokes likely embolic in nature. After the patient returned from MRI she started having periods of apnea with [place]-Stokes Breathing. Neurology was present at bedside with me and felt that the change  in breathing was unrelated to the CVA's. I called PCCM eLink Dr. Ashok Cordia who recommended the patient be placed on BiPAP and given Narcan. PCCM came to evaluate the patient and because of patient's tenuous respiratory status was transferred to ICU to [facility] service. Patient remained on PCCM Service and was improved. Neurology Stroke Team evaluated  and recommended DAPT for 3 months.  Patient was transferred back to [place] this AM and was doing better but still acting confused a little bit and hallucinating so she was placed back on CIWA. Patient started becoming Agitated with Staff. PT/OT Evaluated and recommended SNF.   Assessment & Plan:   Principal Problem:   Elevated troponin Active Problems:   Medical non-compliance   Hypokalemia   Acute respiratory failure with hypoxia (HCC)   CKD (chronic kidney disease) stage 3, GFR 30-59 ml/min   Polysubstance abuse (including cocaine)   Cigarette nicotine dependence with nicotine-induced disorder   Hyperlipidemia LDL goal <70   Cardiomyopathy-etiology not yet determined (suspect hypertensive)   Severe left ventricular systolic dysfunction   Spinal stenosis of lumbar region   DM (diabetes mellitus), type 2, uncontrolled, with renal complications (HCC)   Acute systolic (congestive) heart failure (HCC)   Cerebral thrombosis with cerebral infarction   Cocaine abuse  Hypoxia Episode with Confusion/AMS and Hypotension and periods of Apnea likley from Substance abuse ? If patient took something illicit in the hospital supplied by family, improved  -Desaturated to 79% on 6 Liters and eventually came back to the 90's after being confused and hypotensive on 8/29 -Became Altered again and hypotensive on 8/30 -ABG showed 7.432/30.5/104/20.0/98.6% on 2 Liters -Not on Home O2; C/w supplemental O2 as needed and may need BiPAP -Obtained Head CT w/o Contrast on 06/23/17 and showed No acute intracranial abnormality identified. Mild chronic microvascular ischemic changes and mild parenchymal volume loss of the brain. Small chronic lacunar infarcts in left thalamus and left caudate head. -Obtained CTA of Chest to r/o PE (reduced dose) and showed No evidence of pulmonary embolism. Patchy infiltrates in RIGHT lung question pneumonia versus asymmetric edema. Subsegmental atelectasis LEFT lower lobe.  Enlargement of cardiac chambers with upper normal caliber of the ascending thoracic aorta. -Patient became confused and somnolent and had a difficult time rousing from sleep so EEG and MRI was ordered. Consulted Neurology Dr. Leonel Ramsay and took the patient down for a Stat Head MRI -Official MRI report pending but per Dr. Cecil Cobbs interpretation patient had embolic CVA's -Patient began having periods of Apnea so PCCM was re-consulted and Dr. Ashok Cordia advised BiPAP and Narcan -Held Antihypertensives including BB and Losartan  -Started patient on scheduled Nebs -Given 1x of Narcan with no improvement and repeated -Placed on CIWA Protocol for ? EtOH Witdrawal; C/w MVI, Folic Acid, and Thiamine -PCCM Consulted for further evaluation and Recommendations and patient was transferred to ICU for a day and monitored -Patient's Mental Status Improved.  -Repeat UDS + for Cocaine and THC -C/w Narcan prn   Acute CVA's likley due to Hypotension; Cannot r/o Cardioembolic Infarcts -As Above -Obtained Head CT w/o Contrast on 06/23/17 and showed No acute intracranial abnormality identified. Mild chronic microvascular ischemic changes and mild parenchymal volume loss of the brain. Small chronic lacunar infarcts in left thalamus and left caudate head. -MRI of Head Showed Multifocal subcentimeter acute ischemic infarcts involving the bilateral cerebral hemispheres as above. Findings are most likely embolic in nature. No associated hemorrhage or mass effect. Moderate chronic microvascular ischemic disease with additional remote infarcts involving the left  basal ganglia, left thalamus, and bilateral cerebellar hemispheres. -MRA of the Head showed Negative intracranial MRA for large vessel occlusion. Moderate atherosclerotic disease involving the anterior posterior circulation as above, most notable at the distal right M1 segment -Stroke order Set utilized -Recent ECHO as below -EEG was mildly abnormal due to mild  diffuse slowing -Carotids showed No significant (1-39%) ICA stenosis. Antegrade vertebral flow.  -Lipid Panel showed Cholesterol of 164, HDL of 51, LDL of 96, TG of 87, and VLDL of 17 -HbA1c was >15.5  -Neurology recommending DAPT for 3 months and then Plavix alone for after -PT/OT recommending SNF -Cardiology does not think she would be a candidate for an implantable loop recorder at this time -Recommend considering a 30 day monitor at the time of discharge and resume Bisoprolol when able   Elevated Troponin -Likely demand ischemia -Troponin 0.32, 0.46, 0.41 (relatively flat) -Denied chest pain -Cardiology consulted appreciated -Echocardiogram showed  Systolic function was moderately to severely reduced. The estimated ejection fraction was in the range of 30% to 35%. Diffuse hypokinesis. Features are consistent with a pseudonormal left ventricular filling pattern, with concomitant abnormal relaxation and increased filling pressure (grade 2 diastolic dysfunction). -Patient had heart cath 07/28/2016 which showed no apparent coronary artery disease, continue aggressive medical therapy and both pressure control -Patient currently on aspirin and Plavix and will continue   Acute systolic CHF exacerbation -BNP on admission 1152 -CXR on Admission showed mild pulmonary vascular congestion -Last echocardiogram 05/26/2016 showed an EF of 20-25%, restrictive physiology, decreased left ventricular diastolic compliance and or increased left atrial pressure -Repeat ECHO showed improved EF -IV Lasix 40 mg Daily Held because of Hypotension -Cardiology restarted BB and Losartan on 8/29 however patient became Hypotensive so they were held -Cardiology recommending resuming Bisoprolol and Add ARB as BP allows -Monitor intake and output, daily weights -Cardiology Signed off  Back pain with spinal stenosis of the lumbar region -MRI showed L3-L4 canal stenosis of disorganized cauda equina -Neurosurgery  consultation appreciated and originally patient was supposed to go for Lumbar Discectomy/Decompression for Herniated Disc at Levels L3/4 but unclear whether she will go or not; Cardiology cleared patient -Continue pain control and decreased PRN Oxycodone Frequency  Diabetes mellitus, type II, complicated by hyperglycemia -On arrival to emergency department, blood sugars over 600 -HbA1c >15.5 -Continue Novolog SSI and CBG monitoring -Patient uses Prandin and Tradjenta at home, will hold for now -Diabetic Education Coordinator Consulted  -BS during Apneic Episodes were 81  Hypokalemia, improved -Replete.Continue to monitor BMP  Leukocytosis -? Pneumonia in Right Lung vs Cocaine Use -CXR this AM showed Stable cardiomegaly without edema, significant collapse or consolidation by plain radiography. No effusion or pneumothorax. Trachea is midline. Aorta is atherosclerotic and ectatic. No significant interval change. Stable mild hyperinflation -WBC went from 8.1 -> 11.2 -> 18.0 -> 18.2 -> 13.3 -C/w Abx with IV Cefepime -Continue to Monitor and repeat CMP in AM   Lactic Acidosis -Went from 4.3 -> 3.9 -> 2.6 -> 2.9 -> 2.7 -Bolused 2 250 mL today -Repeat LA in AM   Acute Kidney Injury on Chronic kidney disease, stage III, improving -Creatinine currently 1.82 (Cr was 2.02)  -Foley was placed in ICU -IV Lasix Held -Continue to monitor BMP closely as patient will be diuresing and now that she received Contrast for CTA  Polysubstance/EtOH Abuse -UDS positive for Cocaine x2  -Cocaine Cessastion advised  -Placed on CIWA as patient started Hallucinating -C/w MVI, Folic Acid, Thiamine  Tobacco Abuse -Cessation discussed  Hyperlipidemia -Continue Statin, Fenofibrate  GERD -Continue PPI  COPD -Stable, continue home medications  Anxiety/Depression -Continue Prozac  Hypomagnesemia -Mag Level this AM was 1.6 -Replete with IV Mag Sulfate 1 gram -Continue to Monitor and  Replete as Necessary -Repeat Mag Level in AM   DVT prophylaxis: Enoxaparin 40 mg sq q24h Code Status: FULL CODE Family Communication: No family at bedside  Disposition Plan: SNF when medically stable  Consultants:   Cardiology  Neurosurgery  PCCM  Neurology    Procedures:  ECHOCARDIOGRAM Study Conclusions  - Left ventricle: The cavity size was normal. Wall thickness was   increased in a pattern of moderate LVH. Systolic function was   moderately to severely reduced. The estimated ejection fraction   was in the range of 30% to 35%. Diffuse hypokinesis. Features are   consistent with a pseudonormal left ventricular filling pattern,   with concomitant abnormal relaxation and increased filling   pressure (grade 2 diastolic dysfunction). - Mitral valve: There was moderate regurgitation directed   posteriorly. - Left atrium: The atrium was mildly dilated.  EEG Impression: This awake and drowsy EEG is mildly abnormal due to mild diffuse slowing.    Clinical Correlation: The above finding is indicative of diffuse cerebral dysfunction, which is nonspecific and may be secondary to hypoxic, toxic-metabolic or other diffuse physiologic abnormality.  Clinical correlation advised   Antimicrobials:  Anti-infectives    Start     Dose/Rate Route Frequency Ordered Stop   06/24/17 0800  ceFEPIme (MAXIPIME) 2 g in dextrose 5 % 50 mL IVPB     2 g 100 mL/hr over 30 Minutes Intravenous Every 24 hours 06/24/17 0749       Subjective: Seen and examined at bedside this AM and was doing extremely well and knew where she was at but would start becoming tangential. She started hallucinating some. Complained of Back Pain. Had no nausea or vomiting.   Objective: Vitals:   06/26/17 0934 06/26/17 1416 06/26/17 1648 06/26/17 2047  BP: (!) 112/94 91/76 110/81 119/60  Pulse: (!) 109 (!) 108 (!) 54 70  Resp: 20 20 16 16   Temp: 98.1 F (36.7 C) 97.7 F (36.5 C) 98.2 F (36.8 C) 97.7 F (36.5  C)  TempSrc: Oral Oral Oral Oral  SpO2: 100% 95% 97% 100%  Weight:      Height:        Intake/Output Summary (Last 24 hours) at 06/26/17 2055 Last data filed at 06/26/17 7353  Gross per 24 hour  Intake                3 ml  Output              400 ml  Net             -397 ml   Filed Weights   06/24/17 1935 06/25/17 0424 06/26/17 0500  Weight: 69.5 kg (153 lb 3.5 oz) 70 kg (154 lb 5.2 oz) 72.2 kg (159 lb 3.2 oz)   Examination: Physical Exam:  Constitutional: WN/WD AAF in NAD awake and calm but has points where she hallucinates Eyes: Sclerae anicteric. Conjunctivae non-injected ENMT: Mucous membranes moist. Grossly normal hearing Neck: Supple with no visible JVD Respiratory: Diminished on right but no appreciable wheezing/rales/rhonchi Cardiovascular: RRR; No LE edema Abdomen: Soft, NT, ND bowel sounds present GU: Deferred. Foley Catheter in place Musculoskeletal: No contractures, no cyanosis Skin: Warm and dry. No rashes or lesions Neurologic: CN 2-12 grossly intact. No appreciable focal deficits Psychiatric: Elated  mood and affect almost histrionic. Impaired judgement and insight.   Data Reviewed: I have personally reviewed following labs and imaging studies  CBC:  Recent Labs Lab 06/22/17 0337 06/23/17 0631 06/24/17 0248 06/25/17 0248 06/26/17 1015  WBC 8.1 11.2* 18.0* 18.2* 13.3*  NEUTROABS  --   --  15.4*  --  11.8*  HGB 13.6 13.2 13.6 15.9* 13.2  HCT 41.5 40.9 42.7 49.4* 40.7  MCV 96.7 97.1 98.2 100.4* 97.4  PLT 194 186 213 211 161   Basic Metabolic Panel:  Recent Labs Lab 06/22/17 0337  06/24/17 0248 06/24/17 2147 06/25/17 0557 06/25/17 1547 06/26/17 1015  NA 140  < > 140 140 141 140 141  K 3.2*  < > 5.0 6.0* 5.3* 4.5 4.1  CL 107  < > 108 109 112* 108 108  CO2 26  < > 23 19* 19* 21* 23  GLUCOSE 107*  < > 191* 71 125* 315* 196*  BUN 20  < > 26* 32* 32* 31* 27*  CREATININE 1.36*  < > 1.89* 2.04* 1.99* 2.02* 1.82*  CALCIUM 8.2*  < > 8.5* 8.8*  8.2* 8.1* 8.2*  MG 1.9  --  1.8  --   --   --  1.6*  PHOS  --   --  3.0  --   --   --  2.9  < > = values in this interval not displayed. GFR: Estimated Creatinine Clearance: 32.5 mL/min (A) (by C-G formula based on SCr of 1.82 mg/dL (H)). Liver Function Tests:  Recent Labs Lab 06/21/17 1637 06/24/17 0248  AST 13* 21  ALT 31 23  ALKPHOS 178* 154*  BILITOT 1.2 1.3*  PROT 5.7* 5.3*  ALBUMIN 3.1* 2.2*   No results for input(s): LIPASE, AMYLASE in the last 168 hours.  Recent Labs Lab 06/24/17 2156  AMMONIA 63*   Coagulation Profile: No results for input(s): INR, PROTIME in the last 168 hours. Cardiac Enzymes:  Recent Labs Lab 06/21/17 1637 06/21/17 2129 06/22/17 0337 06/23/17 0631  CKTOTAL 63  --   --  40  CKMB  --   --   --  5.2*  TROPONINI 0.32* 0.46* 0.41*  --    BNP (last 3 results) No results for input(s): PROBNP in the last 8760 hours. HbA1C: No results for input(s): HGBA1C in the last 72 hours. CBG:  Recent Labs Lab 06/25/17 1747 06/25/17 2041 06/26/17 0649 06/26/17 1114 06/26/17 1619  GLUCAP 280* 229* 156* 191* 165*   Lipid Profile:  Recent Labs  06/24/17 2147  CHOL 164  HDL 51  LDLCALC 96  TRIG 87  CHOLHDL 3.2   Thyroid Function Tests: No results for input(s): TSH, T4TOTAL, FREET4, T3FREE, THYROIDAB in the last 72 hours. Anemia Panel: No results for input(s): VITAMINB12, FOLATE, FERRITIN, TIBC, IRON, RETICCTPCT in the last 72 hours. Sepsis Labs:  Recent Labs Lab 06/24/17 0248  06/25/17 0248 06/25/17 0557 06/25/17 1045 06/26/17 1015 06/26/17 1413  PROCALCITON 0.48  --   --  0.73  --   --   --   LATICACIDVEN  --   < > 3.9*  --  2.6* 2.9* 2.7*  < > = values in this interval not displayed.  Recent Results (from the past 240 hour(s))  MRSA PCR Screening     Status: None   Collection Time: 06/23/17  8:23 PM  Result Value Ref Range Status   MRSA by PCR NEGATIVE NEGATIVE Final    Comment:  The GeneXpert MRSA Assay  (FDA approved for NASAL specimens only), is one component of a comprehensive MRSA colonization surveillance program. It is not intended to diagnose MRSA infection nor to guide or monitor treatment for MRSA infections.     Radiology Studies: Dg Chest Port 1 View  Result Date: 06/26/2017 CLINICAL DATA:  Cough, shortness of breath, respiratory failure EXAM: PORTABLE CHEST 1 VIEW COMPARISON:  06/24/2017, 06/23/2017 FINDINGS: Stable cardiomegaly without edema, significant collapse or consolidation by plain radiography. No effusion or pneumothorax. Trachea is midline. Aorta is atherosclerotic and ectatic. No significant interval change. Stable mild hyperinflation. IMPRESSION: Stable cardiomegaly without significant interval change Electronically Signed   By: Jerilynn Mages.  Shick M.D.   On: 06/26/2017 10:07   Scheduled Meds: .  stroke: mapping our early stages of recovery book   Does not apply Once  . aspirin EC  81 mg Oral Daily  . clopidogrel  75 mg Oral Q breakfast  . cycloSPORINE  1 drop Both Eyes BID  . enoxaparin (LOVENOX) injection  30 mg Subcutaneous Q24H  . ezetimibe  10 mg Oral Daily  . fenofibrate  160 mg Oral Daily  . folic acid  1 mg Oral Daily  . insulin aspart  0-5 Units Subcutaneous QHS  . insulin aspart  0-9 Units Subcutaneous TID WC  . ipratropium-albuterol  3 mL Nebulization TID  . lidocaine  1 patch Transdermal Q24H  . living well with diabetes book   Does not apply Once  . LORazepam  0-4 mg Intravenous Q6H   Followed by  . [START ON 06/28/2017] LORazepam  0-4 mg Intravenous Q12H  . mometasone-formoterol  2 puff Inhalation BID  . multivitamin with minerals  1 tablet Oral Daily  . nicotine  7 mg Transdermal Daily  . pantoprazole  40 mg Oral Daily  . rosuvastatin  40 mg Oral Daily  . sodium chloride flush  3 mL Intravenous Q12H  . thiamine  100 mg Oral Daily   Or  . thiamine  100 mg Intravenous Daily  . Vitamin D (Ergocalciferol)  50,000 Units Oral Weekly   Continuous  Infusions: . sodium chloride 75 mL/hr at 06/24/17 1953  . ceFEPime (MAXIPIME) IV Stopped (06/26/17 0805)  . sodium chloride      LOS: 5 days    Kerney Elbe, DO [facility] Pager [phone]  If 7PM-7AM, please contact night-coverage [email or web address] Password [email or web address] 06/26/2017, 8:55 PM

## 2017-06-26 NOTE — Progress Notes (Signed)
No suitable PIV sites noted.  Pt currently has working PIV on RAC receiving NS bolus. Pt cold.

## 2017-06-26 NOTE — Progress Notes (Signed)
Progress Note  Patient Name: [name] Date of Encounter: 06/26/2017  Primary Cardiologist: Dr Bronson Ing  Subjective   Doing well today, the patient denies CP or SOB.  No new concerns  Inpatient Medications    Scheduled Meds: .  stroke: mapping our early stages of recovery book   Does not apply Once  . aspirin EC  81 mg Oral Daily  . clopidogrel  75 mg Oral Q breakfast  . cycloSPORINE  1 drop Both Eyes BID  . enoxaparin (LOVENOX) injection  30 mg Subcutaneous Q24H  . ezetimibe  10 mg Oral Daily  . fenofibrate  160 mg Oral Daily  . folic acid  1 mg Oral Daily  . insulin aspart  0-5 Units Subcutaneous QHS  . insulin aspart  0-9 Units Subcutaneous TID WC  . ipratropium-albuterol  3 mL Nebulization TID  . living well with diabetes book   Does not apply Once  . mometasone-formoterol  2 puff Inhalation BID  . multivitamin with minerals  1 tablet Oral Daily  . nicotine  7 mg Transdermal Daily  . pantoprazole  40 mg Oral Daily  . rosuvastatin  40 mg Oral Daily  . sodium chloride flush  3 mL Intravenous Q12H  . thiamine  100 mg Oral Q24H   Or  . thiamine injection  100 mg Intravenous Q24H  . Vitamin D (Ergocalciferol)  50,000 Units Oral Weekly   Continuous Infusions: . sodium chloride 75 mL/hr at 06/24/17 1953  . ceFEPime (MAXIPIME) IV Stopped (06/26/17 0805)  . sodium chloride     PRN Meds: acetaminophen **OR** acetaminophen, albuterol, bisacodyl, iopamidol, magnesium citrate, naLOXone (NARCAN)  injection, ondansetron **OR** ondansetron (ZOFRAN) IV, polyethylene glycol   Vital Signs    Vitals:   06/25/17 2100 06/26/17 0108 06/26/17 0500 06/26/17 0934  BP: (!) 109/59 (!) 79/69 107/86 (!) 112/94  Pulse: 82 90 75 (!) 109  Resp: 18 20 18 20   Temp: 98.2 F (36.8 C) 97.9 F (36.6 C) 97.7 F (36.5 C) 98.1 F (36.7 C)  TempSrc: Oral Oral Oral Oral  SpO2: 95% 99% 95% 100%  Weight:   159 lb 3.2 oz (72.2 kg)   Height:        Intake/Output Summary (Last 24 hours)  at 06/26/17 1123 Last data filed at 06/26/17 0653  Gross per 24 hour  Intake                3 ml  Output              400 ml  Net             -397 ml   Filed Weights   06/24/17 1935 06/25/17 0424 06/26/17 0500  Weight: 153 lb 3.5 oz (69.5 kg) 154 lb 5.2 oz (70 kg) 159 lb 3.2 oz (72.2 kg)    Telemetry    Sinus rhythm with frequent PACs and salvos of nonsustained atach.  I do not see afib - Personally Reviewed  Physical Exam   GEN- The patient is well appearing, alert and oriented x 3 today.   Head- normocephalic, atraumatic Eyes-  Sclera clear, conjunctiva pink Ears- hearing intact Oropharynx- clear Neck- supple, Lungs- Clear to ausculation bilaterally, normal work of breathing Heart- Regular rate and rhythm  GI- soft, NT, ND, + BS Extremities- no clubbing, cyanosis, or edema  MS- no significant deformity or atrophy Skin- no rash or lesion Psych- euthymic mood, full affect Neuro- strength and sensation are intact   Labs  Chemistry Recent Labs Lab 06/21/17 1637  06/24/17 0248  06/25/17 0557 06/25/17 1547 06/26/17 1015  NA 134*  < > 140  < > 141 140 141  K 2.9*  < > 5.0  < > 5.3* 4.5 4.1  CL 96*  < > 108  < > 112* 108 108  CO2 27  < > 23  < > 19* 21* 23  GLUCOSE 629*  < > 191*  < > 125* 315* 196*  BUN 23*  < > 26*  < > 32* 31* 27*  CREATININE 1.47*  < > 1.89*  < > 1.99* 2.02* 1.82*  CALCIUM 9.1  < > 8.5*  < > 8.2* 8.1* 8.2*  PROT 5.7*  --  5.3*  --   --   --   --   ALBUMIN 3.1*  --  2.2*  --   --   --   --   AST 13*  --  21  --   --   --   --   ALT 31  --  23  --   --   --   --   ALKPHOS 178*  --  154*  --   --   --   --   BILITOT 1.2  --  1.3*  --   --   --   --   GFRNONAA 40*  < > 29*  < > 27* 27* 31*  GFRAA 46*  < > 34*  < > 32* 31* 35*  ANIONGAP 11  < > 9  < > 10 11 10   < > = values in this interval not displayed.   Hematology Recent Labs Lab 06/24/17 0248 06/25/17 0248 06/26/17 1015  WBC 18.0* 18.2* 13.3*  RBC 4.35 4.92 4.18  HGB 13.6 15.9*  13.2  HCT 42.7 49.4* 40.7  MCV 98.2 100.4* 97.4  MCH 31.3 32.3 31.6  MCHC 31.9 32.2 32.4  RDW 14.8 15.1 14.9  PLT 213 211 212    Cardiac Enzymes Recent Labs Lab 06/21/17 1637 06/21/17 2129 06/22/17 0337  TROPONINI 0.32* 0.46* 0.41*   No results for input(s): TROPIPOC in the last 168 hours.     Patient Profile     51 y.o. female followed by cardiology for hypertensive cardiomyopathy, HTN, HL, CRI, and normal cors.  Assessment & Plan    1. Hypertensive cardiovascular disease with CHF EF 20-25% Resume bisoprolol and add ARB as BP allows Keep Is and Os even No further inpatient workup planned  2. Atach, pacs I do not see afib on telemetry Resume bisoprolol when able Given stroke, would consider 30 day monitor at time of discharge.  Given poor compliance and living situation, I do not think that she would be a candidate for implantable loop recorder at this time.  Cardiology team to see as needed while here. Please call with questions.  Follow-up  Thompson Grayer MD, [facility] 06/26/2017 11:23 AM

## 2017-06-26 NOTE — Progress Notes (Addendum)
Patient's lactic acid level 2.9. Dr. Claybon Jabs notified at 1128  Notified Dr. Chana Bode pt's lactic is 2.7 at 1509

## 2017-06-27 LAB — CBC WITH DIFFERENTIAL/PLATELET
Basophils Absolute: 0 10*3/uL (ref 0.0–0.1)
Basophils Relative: 0 %
Eosinophils Absolute: 0 10*3/uL (ref 0.0–0.7)
Eosinophils Relative: 0 %
HCT: 36.8 % (ref 36.0–46.0)
Hemoglobin: 11.7 g/dL — ABNORMAL LOW (ref 12.0–15.0)
Lymphocytes Relative: 9 %
Lymphs Abs: 0.9 10*3/uL (ref 0.7–4.0)
MCH: 30.7 pg (ref 26.0–34.0)
MCHC: 31.8 g/dL (ref 30.0–36.0)
MCV: 96.6 fL (ref 78.0–100.0)
Monocytes Absolute: 0.8 10*3/uL (ref 0.1–1.0)
Monocytes Relative: 8 %
Neutro Abs: 9 10*3/uL — ABNORMAL HIGH (ref 1.7–7.7)
Neutrophils Relative %: 83 %
Platelets: 190 10*3/uL (ref 150–400)
RBC: 3.81 MIL/uL — ABNORMAL LOW (ref 3.87–5.11)
RDW: 15 % (ref 11.5–15.5)
WBC: 10.7 10*3/uL — ABNORMAL HIGH (ref 4.0–10.5)

## 2017-06-27 LAB — VAS US CAROTID
LEFT ECA DIAS: -9 cm/s
LEFT VERTEBRAL DIAS: 15 cm/s
Left CCA dist dias: -12 cm/s
Left CCA dist sys: -36 cm/s
Left CCA prox dias: 11 cm/s
Left CCA prox sys: 50 cm/s
Left ICA dist dias: -14 cm/s
Left ICA dist sys: -36 cm/s
Left ICA prox dias: -6 cm/s
Left ICA prox sys: -16 cm/s
RIGHT ECA DIAS: -16 cm/s
RIGHT VERTEBRAL DIAS: 12 cm/s
Right CCA prox dias: 9 cm/s
Right CCA prox sys: 48 cm/s
Right cca dist sys: -45 cm/s

## 2017-06-27 LAB — COMPREHENSIVE METABOLIC PANEL
ALT: 21 U/L (ref 14–54)
AST: 18 U/L (ref 15–41)
Albumin: 1.7 g/dL — ABNORMAL LOW (ref 3.5–5.0)
Alkaline Phosphatase: 132 U/L — ABNORMAL HIGH (ref 38–126)
Anion gap: 9 (ref 5–15)
BUN: 25 mg/dL — ABNORMAL HIGH (ref 6–20)
CO2: 23 mmol/L (ref 22–32)
Calcium: 8.1 mg/dL — ABNORMAL LOW (ref 8.9–10.3)
Chloride: 111 mmol/L (ref 101–111)
Creatinine, Ser: 1.6 mg/dL — ABNORMAL HIGH (ref 0.44–1.00)
GFR calc Af Amer: 41 mL/min — ABNORMAL LOW (ref >60)
GFR calc non Af Amer: 36 mL/min — ABNORMAL LOW (ref >60)
Glucose, Bld: 102 mg/dL — ABNORMAL HIGH (ref 65–99)
Potassium: 4 mmol/L (ref 3.5–5.1)
Sodium: 143 mmol/L (ref 135–145)
Total Bilirubin: 0.8 mg/dL (ref 0.3–1.2)
Total Protein: 4.6 g/dL — ABNORMAL LOW (ref 6.5–8.1)

## 2017-06-27 LAB — GLUCOSE, CAPILLARY
Glucose-Capillary: 108 mg/dL — ABNORMAL HIGH (ref 65–99)
Glucose-Capillary: 172 mg/dL — ABNORMAL HIGH (ref 65–99)
Glucose-Capillary: 142 mg/dL — ABNORMAL HIGH (ref 65–99)
Glucose-Capillary: 151 mg/dL — ABNORMAL HIGH (ref 65–99)
Glucose-Capillary: 111 mg/dL — ABNORMAL HIGH (ref 65–99)

## 2017-06-27 LAB — PHOSPHORUS: Phosphorus: 3 mg/dL (ref 2.5–4.6)

## 2017-06-27 LAB — MAGNESIUM: Magnesium: 1.8 mg/dL (ref 1.7–2.4)

## 2017-06-27 LAB — HEMOGLOBIN A1C
Hgb A1c MFr Bld: >15.5 % — ABNORMAL HIGH (ref 4.8–5.6)
Mean Plasma Glucose: >398 mg/dL

## 2017-06-27 MED ORDER — BENZONATATE 100 MG PO CAPS
100.0000 mg | ORAL_CAPSULE | Freq: Three times a day (TID) | ORAL | Status: DC | PRN
  Administered 2017-06-27 – 2017-06-30 (×3): 100 mg via ORAL
  Filled 2017-06-27 (×3): qty 1

## 2017-06-27 MED ORDER — GUAIFENESIN ER 600 MG PO TB12
1200.0000 mg | ORAL_TABLET | Freq: Two times a day (BID) | ORAL | Status: DC
  Administered 2017-06-27 – 2017-06-30 (×7): 1200 mg via ORAL
  Filled 2017-06-27 (×8): qty 2

## 2017-06-27 NOTE — Progress Notes (Signed)
Pharmacy Antibiotic Note  [name] is a 51 y.o. female admitted on 06/21/2017 with leukocytosis - PNA in right lung vs Cocaine use. No effusion or pneumothorax. Pharmacy has been consulted for cefepime dosing.  Plan: Continue cefepime 2 grams IV every 24 hours Continue to monitor clinical progress and BMP  Height: 5\' 1"  (154.9 cm) Weight: 159 lb 3.2 oz (72.2 kg) IBW/kg (Calculated) : 47.8  Temp (24hrs), Avg:97.9 F (36.6 C), Min:97.7 F (36.5 C), Max:98.2 F (36.8 C)   Recent Labs Lab 06/23/17 0631 06/24/17 0248  06/24/17 2147 06/25/17 0043 06/25/17 0248 06/25/17 0557 06/25/17 1045 06/25/17 1547 06/26/17 1015 06/26/17 1413 06/27/17 0619  WBC 11.2* 18.0*  --   --   --  18.2*  --   --   --  13.3*  --  10.7*  CREATININE 1.39* 1.89*  --  2.04*  --   --  1.99*  --  2.02* 1.82*  --  1.60*  LATICACIDVEN  --   --   < > 4.3* 3.1* 3.9*  --  2.6*  --  2.9* 2.7*  --   < > = values in this interval not displayed.  Estimated Creatinine Clearance: 37 mL/min (A) (by C-G formula based on SCr of 1.6 mg/dL (H)).    No Known Allergies  Antimicrobials this admission: Cefepime 8/30 >>   Microbiology results: MRSA PCR 8/29 > Neg  Thank you for allowing pharmacy to be a part of this patient's care.   Marquitta Persichetti L. Kyung Rudd, PharmD, [place] PGY1 Pharmacy Resident Pager: [phone]

## 2017-06-27 NOTE — Progress Notes (Signed)
PROGRESS NOTE    [name]  IEP:[id number] DOB: 05-22-1966 DOA: 06/21/2017 PCP: [name], MD   Brief Narrative:  [name] a 51 y.o.femalewith medical history significant of dilated cardiomyopathy with previous ejection fraction of 20-25% now 30-35%, diabetes mellitus type 2, uncontrolled with chronic kidney disease stage III, uncontrolled hypertension, cocaine abuse, tobacco abuse, chronic obstructive pulmonary disease, hyperlipidemia, and medical noncompliance who presents the emergency department stating that she can no longer walk her legs won't hold her up. Patient states that she has not been taking any of her medications for her heart in 2 months. She stopped her diabetes medications 2 weeks ago. She lives by herself in a motel. Home health was coming to help her until the home health company went out of business. She is usually able to walk over the last couple of days she's become increasingly weak late last night or very early this morning she was unable to get up off the commode because of bilateral leg weakness. She slid to the ground and crawled out to the main area of the motel room where she remained in a crosslegged sitting pattern until family came approximately 12 hours later. She didn't call 911 because she did not want to come to the hospital.   Ultimately when family found her and they convinced her come into the emergency department. She has complaints of increased swelling in her legs (not taking her Lasix) and shortness of breath. She feels generally ill which has been progressive and now become severe. She denies any fevers. She has had poor urine output recently not making much urine at all. She has increased thirst. She has no chest pain she denies palpitations. She says she has serious chronic low back pain and this has not worsened but the weakness is severe. Also for past few days she has been having intermittent myoclonic jerking which lasted  about 30 seconds and resolves. She has no other shaking. He has made her symptoms better nothing has made them worse. Symptoms have been chronic and worsening since they developed. She was admitted for Elevated Troponin, CHF Exacerbation, and Back Pain with inability to walk.   On the afternoon of 06/23/17 a Rapid Response was called as the patient became Hypoxic and desaturated to 79% on 6 Liters. Patient was also Hypotensive and confused. She was bolused 250 mL of NS and had her Antihypertensives stopped. Saturations eventually came up.  An ABG was obtained which was unremarkable, a Stat Portable CXR which was normal, and patient was also given 1x of Narcan. Patient was placed on CIWA protocol, ordered a Head CT Scan for her confusion/sleepiness and was also ordered a CTA Chest to r/o PE (reduced Contrast dose). PCCM was consulted to evaluate for patient's Hypoxia episode and patient was transferred to SDU.  On 06/24/17 patient was doing well and this afternoon nurse paged stating the patient became confused again and dropped her BP. Patient's BP improved after a bolus was ordered and ABG done was unremarkable. Because the patient's mentation had changed I went to the bedside and it was very difficult to arouse her so I ordered an EEG and MRI of the Head. Neurology was consulted and evaluated and took the patient down for a STAT MRI and patient was found to have multiple strokes likely embolic in nature. After the patient returned from MRI she started having periods of apnea with [place]-Stokes Breathing. Neurology was present at bedside with me and felt that the change  in breathing was unrelated to the CVA's. I called PCCM eLink Dr. Ashok Cordia who recommended the patient be placed on BiPAP and given Narcan. PCCM came to evaluate the patient and because of patient's tenuous respiratory status was transferred to ICU to [facility] service. Patient remained on PCCM Service and was improved. Neurology Stroke Team evaluated  and recommended DAPT for 3 months.  Patient was transferred back to [facility] Service 06/26/17 and was doing better but still acting confused a little bit and hallucinating so she was placed back on CIWA. Patient started becoming Agitated with Staff. PT/OT Evaluated and recommended SNF. Neurosurgery to re-evaluate in AM and patient unlikely to undergo any surgical procedure in the hospital.   Assessment & Plan:   Principal Problem:   Elevated troponin Active Problems:   Medical non-compliance   Hypokalemia   Acute respiratory failure with hypoxia (HCC)   CKD (chronic kidney disease) stage 3, GFR 30-59 ml/min   Polysubstance abuse (including cocaine)   Cigarette nicotine dependence with nicotine-induced disorder   Hyperlipidemia LDL goal <70   Cardiomyopathy-etiology not yet determined (suspect hypertensive)   Severe left ventricular systolic dysfunction   Spinal stenosis of lumbar region   DM (diabetes mellitus), type 2, uncontrolled, with renal complications (HCC)   Acute systolic (congestive) heart failure (HCC)   Cerebral thrombosis with cerebral infarction   Cocaine abuse  Hypoxia Episode with Confusion/AMS and Hypotension and periods of Apnea likley from Substance abuse ? If patient took something illicit in the hospital supplied by family, improved  -Desaturated to 79% on 6 Liters and eventually came back to the 90's after being confused and hypotensive on 8/29 -Became Altered again and hypotensive on 8/30 -ABG showed 7.432/30.5/104/20.0/98.6% on 2 Liters -Not on Home O2; C/w supplemental O2 as needed and may need BiPAP -Obtained Head CT w/o Contrast on 06/23/17 and showed No acute intracranial abnormality identified. Mild chronic microvascular ischemic changes and mild parenchymal volume loss of the brain. Small chronic lacunar infarcts in left thalamus and left caudate head. -Obtained CTA of Chest to r/o PE (reduced dose) and showed No evidence of pulmonary embolism. Patchy infiltrates  in RIGHT lung question pneumonia versus asymmetric edema. Subsegmental atelectasis LEFT lower lobe. Enlargement of cardiac chambers with upper normal caliber of the ascending thoracic aorta. -Patient became confused and somnolent and had a difficult time rousing from sleep so EEG and MRI was ordered. Consulted Neurology Dr. Leonel Ramsay and took the patient down for a Stat Head MRI -Official MRI report pending but per Dr. Cecil Cobbs interpretation patient had embolic CVA's -Patient began having periods of Apnea so PCCM was re-consulted and Dr. Ashok Cordia advised BiPAP and Narcan -Held Antihypertensives including BB and Losartan  -Started patient on scheduled Nebs -Given 1x of Narcan with no improvement and repeated -Placed on CIWA Protocol for ? EtOH Witdrawal; C/w MVI, Folic Acid, and Thiamine -Patient's Mental Status Improved. PCCM Signed Off -Repeat UDS + for Cocaine and THC -C/w Narcan prn   Acute CVA's likley due to Hypotension; Cannot r/o Cardioembolic Infarcts -As Above -Obtained Head CT w/o Contrast on 06/23/17 and showed No acute intracranial abnormality identified. Mild chronic microvascular ischemic changes and mild parenchymal volume loss of the brain. Small chronic lacunar infarcts in left thalamus and left caudate head. -MRI of Head Showed Multifocal subcentimeter acute ischemic infarcts involving the bilateral cerebral hemispheres as above. Findings are most likely embolic in nature. No associated hemorrhage or mass effect. Moderate chronic microvascular ischemic disease with additional remote infarcts involving the left basal  ganglia, left thalamus, and bilateral cerebellar hemispheres. -MRA of the Head showed Negative intracranial MRA for large vessel occlusion. Moderate atherosclerotic disease involving the anterior posterior circulation as above, most notable at the distal right M1 segment -Stroke order Set utilized -Recent ECHO as below -EEG was mildly abnormal due to mild  diffuse slowing -Carotids showed No significant (1-39%) ICA stenosis. Antegrade vertebral flow.  -Lipid Panel showed Cholesterol of 164, HDL of 51, LDL of 96, TG of 87, and VLDL of 17 -HbA1c was >15.5  -Neurology recommending DAPT for 3 months and then Plavix alone for after -PT/OT recommending SNF -Cardiology does not think she would be a candidate for an implantable loop recorder at this time -Recommend considering a 30 day monitor at the time of discharge and resume Bisoprolol when able   Elevated Troponin -Likely demand ischemia in the Setting of Cocaine Use -Troponin 0.32, 0.46, 0.41 (relatively flat) -Denied chest pain -Cardiology consulted appreciated -Echocardiogram showed  Systolic function was moderately to severely reduced. The estimated ejection fraction was in the range of 30% to 35%. Diffuse hypokinesis. Features are consistent with a pseudonormal left ventricular filling pattern, with concomitant abnormal relaxation and increased filling pressure (grade 2 diastolic dysfunction). -Patient had heart cath 07/28/2016 which showed no apparent coronary artery disease, continue aggressive medical therapy and both pressure control -Patient currently on aspirin and Plavix and will continue   Acute systolic CHF exacerbation -BNP on admission 1152 -CXR on Admission showed mild pulmonary vascular congestion -Last echocardiogram 05/26/2016 showed an EF of 20-25%, restrictive physiology, decreased left ventricular diastolic compliance and or increased left atrial pressure -Repeat ECHO showed improved EF -IV Lasix 40 mg Daily Held because of Hypotension and was given IVF. IVF now D/C'd  -Cardiology restarted BB and Losartan on 8/29 however patient became Hypotensive so they were held -Cardiology recommending resuming Bisoprolol and Add ARB as BP allows -Monitor intake and output, daily weights; Patient is +2.142 Liters -Cardiology Signed off  Back pain with spinal stenosis of the lumbar  region -MRI showed L3-L4 canal stenosis of disorganized cauda equina -Neurosurgery consultation appreciated and originally patient was supposed to go for Lumbar Discectomy/Decompression for Herniated Disc at Levels L3/4 but unclear whether she will go or not; Cardiology cleared patient -Discussed Case with Dr. Ellene Route and patient unlikely to undergo surgical procedure -Continue pain control and decreased PRN Oxycodone Frequency  Diabetes mellitus, type II, complicated by hyperglycemia -On arrival to emergency department, blood sugars over 600 -HbA1c >15.5 -Continue Novolog SSI and CBG monitoring -Patient uses Prandin and Tradjenta at home, will hold for now -Diabetic Education Coordinator Consulted  -CBG's ranging from 142-151  Hypokalemia, improved -Replete.Continue to monitor BMP  Leukocytosis -? Pneumonia in Right Lung vs Cocaine Use -CXR this AM showed Stable cardiomegaly without edema, significant collapse or consolidation by plain radiography. No effusion or pneumothorax. Trachea is midline. Aorta is atherosclerotic and ectatic. No significant interval change. Stable mild hyperinflation -Added Mucinex and Tessalon Pearls for cough -WBC went from 8.1 -> 11.2 -> 18.0 -> 18.2 -> 13.3 -> 10.7 -C/w Abx with IV Cefepime Day 3 -Continue to Monitor and repeat CMP in AM   Lactic Acidosis -Went from 4.3 -> 3.9 -> 2.6 -> 2.9 -> 2.7 -Bolused two 250 mL today -Repeat LA in AM   Acute Kidney Injury on Chronic kidney disease, stage III, improving -Creatinine currently trending down  -Foley was placed in ICU and now removed  -IV Lasix Held as patient was on IVF. IVF now D/C'd -Continue  to monitor BMP closely as patient will be diuresing and now that she received Contrast for CTA  Polysubstance/EtOH Abuse -UDS positive for Cocaine x2  -Cocaine Cessastion advised  -Placed on CIWA as patient started Hallucinating -C/w MVI, Folic Acid, Thiamine  Tobacco Abuse -Cessation  discussed  Hyperlipidemia -Continue Statin, Fenofibrate  GERD -Continue PPI  COPD -Stable, continue home medications  -As Above  Anxiety/Depression -Continue Prozac  Hypomagnesemia -Mag Level this AM was 1.8 -Continue to Monitor and Replete as Necessary -Repeat Mag Level in AM   DVT prophylaxis: Enoxaparin 40 mg sq q24h Code Status: FULL CODE Family Communication: Son at bedside Disposition Plan: SNF when medically stable  Consultants:   Cardiology  Neurosurgery  PCCM  Neurology    Procedures:  ECHOCARDIOGRAM Study Conclusions  - Left ventricle: The cavity size was normal. Wall thickness was   increased in a pattern of moderate LVH. Systolic function was   moderately to severely reduced. The estimated ejection fraction   was in the range of 30% to 35%. Diffuse hypokinesis. Features are   consistent with a pseudonormal left ventricular filling pattern,   with concomitant abnormal relaxation and increased filling   pressure (grade 2 diastolic dysfunction). - Mitral valve: There was moderate regurgitation directed   posteriorly. - Left atrium: The atrium was mildly dilated.  EEG Impression: This awake and drowsy EEG is mildly abnormal due to mild diffuse slowing.    Clinical Correlation: The above finding is indicative of diffuse cerebral dysfunction, which is nonspecific and may be secondary to hypoxic, toxic-metabolic or other diffuse physiologic abnormality.  Clinical correlation advised   Antimicrobials:  Anti-infectives    Start     Dose/Rate Route Frequency Ordered Stop   06/24/17 0800  ceFEPIme (MAXIPIME) 2 g in dextrose 5 % 50 mL IVPB     2 g 100 mL/hr over 30 Minutes Intravenous Every 24 hours 06/24/17 0749       Subjective: Seen and examined at bedside this AM and was doing better and wanted to go to the [facility]. Also wanted something for a cough. No CP or SOB but still felt weak. No other concerns or complaints at this time.    Objective: Vitals:   06/27/17 0536 06/27/17 0949 06/27/17 1317 06/27/17 1650  BP: 110/77 (!) 122/98 104/81 91/79  Pulse: 87 92 89 92  Resp: 16 20 20 20   Temp: 97.9 F (36.6 C) 97.8 F (36.6 C) 98.2 F (36.8 C) 98.2 F (36.8 C)  TempSrc: Oral Oral Oral Oral  SpO2: 100% 97%  100%  Weight:      Height:        Intake/Output Summary (Last 24 hours) at 06/27/17 1942 Last data filed at 06/27/17 0900  Gross per 24 hour  Intake              120 ml  Output                0 ml  Net              120 ml   Filed Weights   06/24/17 1935 06/25/17 0424 06/26/17 0500  Weight: 69.5 kg (153 lb 3.5 oz) 70 kg (154 lb 5.2 oz) 72.2 kg (159 lb 3.2 oz)   Examination: Physical Exam:  Constitutional: Histrionic WN/WD AAF in NAD sitting in chair bedside Eyes: Sclerae anicteric; Conjunctivae Non-injected ENMT: Mucous membranes moist. Grossly normal hearing Neck: Supple with no JVD Respiratory: Diminished at the bases especially right. No wheezing/rales/rhonchi but  patient had a cough Cardiovascular: RRR; No appreciable LE edema Abdomen: Soft, NT, ND. Bowel sounds present GU: Deferred. Foley catheter in place and about to be removed by nursing Musculoskeletal: No contractures. No cyanosis Skin: Warm and dry. No rashes or lesions on a limited skin eval Neurologic: CN 2-12 grossly intact. No appreciable focal deficits Psychiatric: Elated and Histrionic. Impaired judgment and insight.   Data Reviewed: I have personally reviewed following labs and imaging studies  CBC:  Recent Labs Lab 06/23/17 0631 06/24/17 0248 06/25/17 0248 06/26/17 1015 06/27/17 0619  WBC 11.2* 18.0* 18.2* 13.3* 10.7*  NEUTROABS  --  15.4*  --  11.8* 9.0*  HGB 13.2 13.6 15.9* 13.2 11.7*  HCT 40.9 42.7 49.4* 40.7 36.8  MCV 97.1 98.2 100.4* 97.4 96.6  PLT 186 213 211 212 062   Basic Metabolic Panel:  Recent Labs Lab 06/22/17 0337  06/24/17 0248 06/24/17 2147 06/25/17 0557 06/25/17 1547 06/26/17 1015  06/27/17 0619  NA 140  < > 140 140 141 140 141 143  K 3.2*  < > 5.0 6.0* 5.3* 4.5 4.1 4.0  CL 107  < > 108 109 112* 108 108 111  CO2 26  < > 23 19* 19* 21* 23 23  GLUCOSE 107*  < > 191* 71 125* 315* 196* 102*  BUN 20  < > 26* 32* 32* 31* 27* 25*  CREATININE 1.36*  < > 1.89* 2.04* 1.99* 2.02* 1.82* 1.60*  CALCIUM 8.2*  < > 8.5* 8.8* 8.2* 8.1* 8.2* 8.1*  MG 1.9  --  1.8  --   --   --  1.6* 1.8  PHOS  --   --  3.0  --   --   --  2.9 3.0  < > = values in this interval not displayed. GFR: Estimated Creatinine Clearance: 37 mL/min (A) (by C-G formula based on SCr of 1.6 mg/dL (H)). Liver Function Tests:  Recent Labs Lab 06/21/17 1637 06/24/17 0248 06/27/17 0619  AST 13* 21 18  ALT 31 23 21   ALKPHOS 178* 154* 132*  BILITOT 1.2 1.3* 0.8  PROT 5.7* 5.3* 4.6*  ALBUMIN 3.1* 2.2* 1.7*   No results for input(s): LIPASE, AMYLASE in the last 168 hours.  Recent Labs Lab 06/24/17 2156  AMMONIA 63*   Coagulation Profile: No results for input(s): INR, PROTIME in the last 168 hours. Cardiac Enzymes:  Recent Labs Lab 06/21/17 1637 06/21/17 2129 06/22/17 0337 06/23/17 0631  CKTOTAL 63  --   --  40  CKMB  --   --   --  5.2*  TROPONINI 0.32* 0.46* 0.41*  --    BNP (last 3 results) No results for input(s): PROBNP in the last 8760 hours. HbA1C:  Recent Labs  06/25/17 0557  HGBA1C >15.5*   CBG:  Recent Labs Lab 06/26/17 2214 06/27/17 0635 06/27/17 1129 06/27/17 1609 06/27/17 1743  GLUCAP 102* 108* 172* 142* 151*   Lipid Profile:  Recent Labs  06/24/17 2147  CHOL 164  HDL 51  LDLCALC 96  TRIG 87  CHOLHDL 3.2   Thyroid Function Tests: No results for input(s): TSH, T4TOTAL, FREET4, T3FREE, THYROIDAB in the last 72 hours. Anemia Panel: No results for input(s): VITAMINB12, FOLATE, FERRITIN, TIBC, IRON, RETICCTPCT in the last 72 hours. Sepsis Labs:  Recent Labs Lab 06/24/17 0248  06/25/17 0248 06/25/17 0557 06/25/17 1045 06/26/17 1015 06/26/17 1413   PROCALCITON 0.48  --   --  0.73  --   --   --  LATICACIDVEN  --   < > 3.9*  --  2.6* 2.9* 2.7*  < > = values in this interval not displayed.  Recent Results (from the past 240 hour(s))  MRSA PCR Screening     Status: None   Collection Time: 06/23/17  8:23 PM  Result Value Ref Range Status   MRSA by PCR NEGATIVE NEGATIVE Final    Comment:        The GeneXpert MRSA Assay (FDA approved for NASAL specimens only), is one component of a comprehensive MRSA colonization surveillance program. It is not intended to diagnose MRSA infection nor to guide or monitor treatment for MRSA infections.     Radiology Studies: Dg Chest Port 1 View  Result Date: 06/26/2017 CLINICAL DATA:  Cough, shortness of breath, respiratory failure EXAM: PORTABLE CHEST 1 VIEW COMPARISON:  06/24/2017, 06/23/2017 FINDINGS: Stable cardiomegaly without edema, significant collapse or consolidation by plain radiography. No effusion or pneumothorax. Trachea is midline. Aorta is atherosclerotic and ectatic. No significant interval change. Stable mild hyperinflation. IMPRESSION: Stable cardiomegaly without significant interval change Electronically Signed   By: Jerilynn Mages.  Shick M.D.   On: 06/26/2017 10:07   Scheduled Meds: .  stroke: mapping our early stages of recovery book   Does not apply Once  . aspirin EC  81 mg Oral Daily  . clopidogrel  75 mg Oral Q breakfast  . cycloSPORINE  1 drop Both Eyes BID  . enoxaparin (LOVENOX) injection  30 mg Subcutaneous Q24H  . ezetimibe  10 mg Oral Daily  . fenofibrate  160 mg Oral Daily  . folic acid  1 mg Oral Daily  . guaiFENesin  1,200 mg Oral BID  . insulin aspart  0-5 Units Subcutaneous QHS  . insulin aspart  0-9 Units Subcutaneous TID WC  . ipratropium-albuterol  3 mL Nebulization TID  . lidocaine  1 patch Transdermal Q24H  . living well with diabetes book   Does not apply Once  . LORazepam  0-4 mg Intravenous Q6H   Followed by  . [START ON 06/28/2017] LORazepam  0-4 mg  Intravenous Q12H  . mometasone-formoterol  2 puff Inhalation BID  . multivitamin with minerals  1 tablet Oral Daily  . nicotine  7 mg Transdermal Daily  . pantoprazole  40 mg Oral Daily  . rosuvastatin  40 mg Oral Daily  . sodium chloride flush  3 mL Intravenous Q12H  . thiamine  100 mg Oral Daily   Or  . thiamine  100 mg Intravenous Daily  . Vitamin D (Ergocalciferol)  50,000 Units Oral Weekly   Continuous Infusions: . sodium chloride 75 mL/hr at 06/24/17 1953  . ceFEPime (MAXIPIME) IV Stopped (06/27/17 1028)  . sodium chloride      LOS: 6 days    Kerney Elbe, DO [facility] Pager [phone]  If 7PM-7AM, please contact night-coverage [email or web address] Password [facility] 06/27/2017, 7:42 PM

## 2017-06-27 NOTE — Progress Notes (Signed)
Rt attempted to give pt inhaler and nebulizer treatments. Pt was upset about spilling her orange juice. After cleaning up spill and patient RT again attempted to offer treatments pt screamed "forget the breathing treatments". Will try back at next scheduled tx.

## 2017-06-27 NOTE — Progress Notes (Signed)
CBG 61. Administered 25cc of D50. Recheck 102. Dr. Kennon Holter made aware.

## 2017-06-27 NOTE — Progress Notes (Signed)
D/C RFA IV d/t infiltration. IV team placed another line RFA using [name], this line is infiltrated too.

## 2017-06-28 ENCOUNTER — Inpatient Hospital Stay (HOSPITAL_COMMUNITY): Payer: Medicare Other

## 2017-06-28 ENCOUNTER — Other Ambulatory Visit: Payer: Self-pay | Admitting: Family Medicine

## 2017-06-28 ENCOUNTER — Other Ambulatory Visit: Payer: Self-pay | Admitting: Cardiovascular Disease

## 2017-06-28 DIAGNOSIS — E669 Obesity, unspecified: Principal | ICD-10-CM

## 2017-06-28 DIAGNOSIS — M25471 Effusion, right ankle: Secondary | ICD-10-CM

## 2017-06-28 DIAGNOSIS — E1169 Type 2 diabetes mellitus with other specified complication: Secondary | ICD-10-CM

## 2017-06-28 LAB — CBC WITH DIFFERENTIAL/PLATELET
Basophils Absolute: 0 10*3/uL (ref 0.0–0.1)
Basophils Relative: 0 %
Eosinophils Absolute: 0.1 10*3/uL (ref 0.0–0.7)
Eosinophils Relative: 1 %
HCT: 37.9 % (ref 36.0–46.0)
Hemoglobin: 12 g/dL (ref 12.0–15.0)
Lymphocytes Relative: 15 %
Lymphs Abs: 1.1 10*3/uL (ref 0.7–4.0)
MCH: 30.9 pg (ref 26.0–34.0)
MCHC: 31.7 g/dL (ref 30.0–36.0)
MCV: 97.7 fL (ref 78.0–100.0)
Monocytes Absolute: 0.6 10*3/uL (ref 0.1–1.0)
Monocytes Relative: 8 %
Neutro Abs: 5.4 10*3/uL (ref 1.7–7.7)
Neutrophils Relative %: 76 %
Platelets: 193 10*3/uL (ref 150–400)
RBC: 3.88 MIL/uL (ref 3.87–5.11)
RDW: 15 % (ref 11.5–15.5)
WBC: 7.1 10*3/uL (ref 4.0–10.5)

## 2017-06-28 LAB — COMPREHENSIVE METABOLIC PANEL
ALT: 24 U/L (ref 14–54)
AST: 29 U/L (ref 15–41)
Albumin: 2 g/dL — ABNORMAL LOW (ref 3.5–5.0)
Alkaline Phosphatase: 138 U/L — ABNORMAL HIGH (ref 38–126)
Anion gap: 7 (ref 5–15)
BUN: 26 mg/dL — ABNORMAL HIGH (ref 6–20)
CO2: 23 mmol/L (ref 22–32)
Calcium: 8.1 mg/dL — ABNORMAL LOW (ref 8.9–10.3)
Chloride: 110 mmol/L (ref 101–111)
Creatinine, Ser: 1.83 mg/dL — ABNORMAL HIGH (ref 0.44–1.00)
GFR calc Af Amer: 35 mL/min — ABNORMAL LOW (ref >60)
GFR calc non Af Amer: 30 mL/min — ABNORMAL LOW (ref >60)
Glucose, Bld: 151 mg/dL — ABNORMAL HIGH (ref 65–99)
Potassium: 4.3 mmol/L (ref 3.5–5.1)
Sodium: 140 mmol/L (ref 135–145)
Total Bilirubin: 0.9 mg/dL (ref 0.3–1.2)
Total Protein: 5.3 g/dL — ABNORMAL LOW (ref 6.5–8.1)

## 2017-06-28 LAB — GLUCOSE, CAPILLARY
Glucose-Capillary: 194 mg/dL — ABNORMAL HIGH (ref 65–99)
Glucose-Capillary: 162 mg/dL — ABNORMAL HIGH (ref 65–99)
Glucose-Capillary: 80 mg/dL (ref 65–99)
Glucose-Capillary: 267 mg/dL — ABNORMAL HIGH (ref 65–99)

## 2017-06-28 LAB — MAGNESIUM: Magnesium: 1.8 mg/dL (ref 1.7–2.4)

## 2017-06-28 LAB — PHOSPHORUS: Phosphorus: 2.5 mg/dL (ref 2.5–4.6)

## 2017-06-28 MED ORDER — FUROSEMIDE 20 MG PO TABS
20.0000 mg | ORAL_TABLET | Freq: Every day | ORAL | Status: DC
  Administered 2017-06-28 – 2017-06-29 (×2): 20 mg via ORAL
  Filled 2017-06-28 (×2): qty 1

## 2017-06-28 MED ORDER — ALBUTEROL SULFATE (2.5 MG/3ML) 0.083% IN NEBU
2.5000 mg | INHALATION_SOLUTION | RESPIRATORY_TRACT | Status: DC | PRN
  Administered 2017-06-28 – 2017-06-30 (×2): 2.5 mg via RESPIRATORY_TRACT
  Filled 2017-06-28 (×2): qty 3

## 2017-06-28 MED ORDER — FENTANYL CITRATE (PF) 100 MCG/2ML IJ SOLN
12.5000 ug | Freq: Four times a day (QID) | INTRAMUSCULAR | Status: DC | PRN
  Administered 2017-06-29: 12.5 ug via INTRAVENOUS
  Filled 2017-06-28: qty 2

## 2017-06-28 MED ORDER — IPRATROPIUM-ALBUTEROL 0.5-2.5 (3) MG/3ML IN SOLN
3.0000 mL | Freq: Two times a day (BID) | RESPIRATORY_TRACT | Status: DC
  Administered 2017-06-28 – 2017-06-29 (×2): 3 mL via RESPIRATORY_TRACT
  Filled 2017-06-28 (×2): qty 3

## 2017-06-28 MED ORDER — SODIUM CHLORIDE 0.9 % IV SOLN
INTRAVENOUS | Status: DC
  Administered 2017-06-28: 09:00:00 via INTRAVENOUS

## 2017-06-28 MED ORDER — HYDROCORTISONE VALERATE 0.2 % EX CREA
TOPICAL_CREAM | Freq: Two times a day (BID) | CUTANEOUS | Status: DC
  Administered 2017-06-28: 22:00:00 via TOPICAL
  Administered 2017-06-29: 1 via TOPICAL
  Administered 2017-06-29: 09:00:00 via TOPICAL
  Filled 2017-06-28: qty 15

## 2017-06-28 MED ORDER — FUROSEMIDE 20 MG PO TABS: 20.0000 mg | ORAL_TABLET | Freq: Every day | ORAL | Status: DC

## 2017-06-28 NOTE — Progress Notes (Signed)
PROGRESS NOTE    [name]  OHY:[id number] DOB: 1966/02/20 DOA: 06/21/2017 PCP: [name], MD   Brief Narrative:  [name] a 51 y.o.femalewith medical history significant of dilated cardiomyopathy with previous ejection fraction of 20-25% now 30-35%, diabetes mellitus type 2, uncontrolled with chronic kidney disease stage III, uncontrolled hypertension, cocaine abuse, tobacco abuse, chronic obstructive pulmonary disease, hyperlipidemia, and medical noncompliance who presents the emergency department stating that she can no longer walk her legs won't hold her up. Patient states that she has not been taking any of her medications for her heart in 2 months. She stopped her diabetes medications 2 weeks ago. She lives by herself in a motel. Home health was coming to help her until the home health company went out of business. She is usually able to walk over the last couple of days she's become increasingly weak late last night or very early this morning she was unable to get up off the commode because of bilateral leg weakness. She slid to the ground and crawled out to the main area of the motel room where she remained in a crosslegged sitting pattern until family came approximately 12 hours later. She didn't call 911 because she did not want to come to the hospital.   Ultimately when family found her and they convinced her come into the emergency department. She has complaints of increased swelling in her legs (not taking her Lasix) and shortness of breath. She feels generally ill which has been progressive and now become severe. She denies any fevers. She has had poor urine output recently not making much urine at all. She has increased thirst. She has no chest pain she denies palpitations. She says she has serious chronic low back pain and this has not worsened but the weakness is severe. Also for past few days she has been having intermittent myoclonic jerking which lasted  about 30 seconds and resolves. She has no other shaking. He has made her symptoms better nothing has made them worse. Symptoms have been chronic and worsening since they developed. She was admitted for Elevated Troponin, CHF Exacerbation, and Back Pain with inability to walk.   On the afternoon of 06/23/17 a Rapid Response was called as the patient became Hypoxic and desaturated to 79% on 6 Liters. Patient was also Hypotensive and confused. She was bolused 250 mL of NS and had her Antihypertensives stopped. Saturations eventually came up.  An ABG was obtained which was unremarkable, a Stat Portable CXR which was normal, and patient was also given 1x of Narcan. Patient was placed on CIWA protocol, ordered a Head CT Scan for her confusion/sleepiness and was also ordered a CTA Chest to r/o PE (reduced Contrast dose). PCCM was consulted to evaluate for patient's Hypoxia episode and patient was transferred to SDU.  On 06/24/17 patient was doing well and this afternoon nurse paged stating the patient became confused again and dropped her BP. Patient's BP improved after a bolus was ordered and ABG done was unremarkable. Because the patient's mentation had changed I went to the bedside and it was very difficult to arouse her so I ordered an EEG and MRI of the Head. Neurology was consulted and evaluated and took the patient down for a STAT MRI and patient was found to have multiple strokes likely embolic in nature. After the patient returned from MRI she started having periods of apnea with [place]-Stokes Breathing. Neurology was present at bedside with me and felt that the change  in breathing was unrelated to the CVA's. I called PCCM eLink Dr. Ashok Cordia who recommended the patient be placed on BiPAP and given Narcan. PCCM came to evaluate the patient and because of patient's tenuous respiratory status was transferred to ICU to [facility] service. Patient remained on PCCM Service and was improved. Neurology Stroke Team evaluated  and recommended DAPT for 3 months.  Patient was transferred back to [facility] Service 06/26/17 and was doing better but still acting confused a little bit and hallucinating so she was placed back on CIWA.   She is was evaluated by PT/OT and they recommend SNF. Neuro surgery came to evaluate the patient and feel like she demonstrates good strength in her lower extremities without radicular pain. Social worker working on [facility] placement and patient hoping for [employer] in [place]. Patient's Right ankle was swollen and showed no fractures on imaging.   Assessment & Plan:   Principal Problem:   Elevated troponin Active Problems:   Medical non-compliance   Hypokalemia   Acute respiratory failure with hypoxia (HCC)   CKD (chronic kidney disease) stage 3, GFR 30-59 ml/min   Polysubstance abuse (including cocaine)   Cigarette nicotine dependence with nicotine-induced disorder   Hyperlipidemia LDL goal <70   Cardiomyopathy-etiology not yet determined (suspect hypertensive)   Severe left ventricular systolic dysfunction   Spinal stenosis of lumbar region   DM (diabetes mellitus), type 2, uncontrolled, with renal complications (HCC)   Acute systolic (congestive) heart failure (HCC)   Cerebral thrombosis with cerebral infarction   Cocaine abuse  Hypoxia Episode with Confusion/AMS and Hypotension and periods of Apnea likley from Substance abuse ? If patient took something illicit in the hospital supplied by family, improved  -Desaturated to 79% on 6 Liters and eventually came back to the 90's after being confused and hypotensive on 8/29 -Became Altered again and hypotensive on 8/30 -ABG showed 7.432/30.5/104/20.0/98.6% on 2 Liters -Not on Home O2; C/w supplemental O2 as needed and may need BiPAP -Obtained Head CT w/o Contrast on 06/23/17 and showed No acute intracranial abnormality identified. Mild chronic microvascular ischemic changes and mild parenchymal volume loss of the brain. Small chronic lacunar  infarcts in left thalamus and left caudate head. -Obtained CTA of Chest to r/o PE (reduced dose) and showed No evidence of pulmonary embolism. Patchy infiltrates in RIGHT lung question pneumonia versus asymmetric edema. Subsegmental atelectasis LEFT lower lobe. Enlargement of cardiac chambers with upper normal caliber of the ascending thoracic aorta. -Patient became confused and somnolent and had a difficult time rousing from sleep so EEG and MRI was ordered. Consulted Neurology Dr. Leonel Ramsay and took the patient down for a Stat Head MRI -Official MRI report pending but per Dr. Cecil Cobbs interpretation patient had embolic CVA's -Patient began having periods of Apnea so PCCM was re-consulted and Dr. Ashok Cordia advised BiPAP and Narcan -Held Antihypertensives including BB and Losartan  -Started patient on scheduled Nebs -Given 1x of Narcan with no improvement and repeated -Placed on CIWA Protocol for ? EtOH Witdrawal; C/w MVI, Folic Acid, and Thiamine -Patient's Mental Status Improved. PCCM Signed Off -Repeat UDS + for Cocaine and THC -C/w Narcan prn   Acute CVA's likley due to Hypotension; Cannot r/o Cardioembolic Infarcts -As Above -Obtained Head CT w/o Contrast on 06/23/17 and showed No acute intracranial abnormality identified. Mild chronic microvascular ischemic changes and mild parenchymal volume loss of the brain. Small chronic lacunar infarcts in left thalamus and left caudate head. -MRI of Head Showed Multifocal subcentimeter acute ischemic infarcts involving the  bilateral cerebral hemispheres as above. Findings are most likely embolic in nature. No associated hemorrhage or mass effect. Moderate chronic microvascular ischemic disease with additional remote infarcts involving the left basal ganglia, left thalamus, and bilateral cerebellar hemispheres. -MRA of the Head showed Negative intracranial MRA for large vessel occlusion. Moderate atherosclerotic disease involving the anterior  posterior circulation as above, most notable at the distal right M1 segment -Stroke order Set utilized -Recent ECHO as below -EEG was mildly abnormal due to mild diffuse slowing -Carotids showed No significant (1-39%) ICA stenosis. Antegrade vertebral flow.  -Lipid Panel showed Cholesterol of 164, HDL of 51, LDL of 96, TG of 87, and VLDL of 17 -HbA1c was >15.5  -Neurology recommending DAPT for 3 months and then Plavix alone for after -PT/OT recommending SNF -Cardiology does not think she would be a candidate for an implantable loop recorder at this time -Recommend considering a 30 day monitor at the time of discharge and resume Bisoprolol when able   Elevated Troponin -Likely demand ischemia in the Setting of Cocaine Use -Troponin 0.32, 0.46, 0.41 (relatively flat) -Denied chest pain -Cardiology consulted appreciated -Echocardiogram showed  Systolic function was moderately to severely reduced. The estimated ejection fraction was in the range of 30% to 35%. Diffuse hypokinesis. Features are consistent with a pseudonormal left ventricular filling pattern, with concomitant abnormal relaxation and increased filling pressure (grade 2 diastolic dysfunction). -Patient had heart cath 07/28/2016 which showed no apparent coronary artery disease, continue aggressive medical therapy and both pressure control -Patient currently on Aspirin and Plavix and will continue   Acute systolic CHF exacerbation -BNP on admission 1152 -CXR on Admission showed mild pulmonary vascular congestion -Last echocardiogram 05/26/2016 showed an EF of 20-25%, restrictive physiology, decreased left ventricular diastolic compliance and or increased left atrial pressure -Repeat ECHO showed improved EF -IV Lasix 40 mg Daily Held because of Hypotension and was given IVF. IVF now D/C'd  -Cardiology restarted BB and Losartan on 8/29 however patient became Hypotensive so they were held -Cardiology recommending resuming Bisoprolol  and Add ARB as BP allows -Monitor intake and output, daily weights; Patient is +2.540 Liters -Cardiology Signed off  Back pain with spinal stenosis of the lumbar region -MRI showed L3-L4 canal stenosis of disorganized cauda equina -Neurosurgery consultation appreciated and originally patient was supposed to go for Lumbar Discectomy/Decompression for Herniated Disc at Levels L3/4 but unclear whether she will go or not; Cardiology cleared patient -Discussed Case with Dr. Ellene Route and patient unlikely to undergo surgical procedure -Continue pain control with Lidocaine Patch and added IV Fentanyl 12.5 mcg q6hprn  Diabetes mellitus, type II, complicated by hyperglycemia -On arrival to emergency department, blood sugars over 600 -HbA1c >15.5 -Continue Novolog SSI and CBG monitoring -Patient uses Prandin and Tradjenta at home, will hold for now -Diabetic Education Coordinator Consulted  -CBG's ranging from 80-267  Hypokalemia, improved -Replete.Continue to monitor BMP  Leukocytosis -? Pneumonia in Right Lung vs Cocaine Use -CXR showed Stable cardiomegaly without edema, significant collapse or consolidation by plain radiography. No effusion or pneumothorax. Trachea is midline. Aorta is atherosclerotic and ectatic. No significant interval change. Stable mild hyperinflation -Added Mucinex and Tessalon Pearls for cough -WBC went from 8.1 -> 11.2 -> 18.0 -> 18.2 -> 13.3 -> 10.7 -> 7.1 -C/w Abx with IV Cefepime Day 3 -Continue to Monitor and repeat CMP in AM along with CXR   Lactic Acidosis -Went from 4.3 -> 3.9 -> 2.6 -> 2.9 -> 2.7  Acute Kidney Injury on Chronic kidney disease,  stage III, improving -Creatinine was trending down and went from 1.6 -> 1.83   -Foley was placed in ICU and now removed  -IV Lasix Held as patient was on IVF. IVF now D/C'd; Will resume home po Lasix  -Continue to monitor BMP closely as patient will be diuresing and now that she received Contrast for  CTA  Polysubstance/EtOH Abuse -UDS positive for Cocaine x2  -Cocaine Cessastion advised  -Placed on CIWA as patient started Hallucinating and has EtOH history  -C/w MVI, Folic Acid, Thiamine  Tobacco Abuse -Cessation discussed -Added Nicotine Patch   Hyperlipidemia -Continue Statin, Fenofibrate  GERD -Continue PPI  COPD -Stable, continue home medications -Has Albuterol 2.5 mg Neb q4hprn Wheezing and SOB and DuoNEB BID -Continue with Dulera 2 puff IH BID -C/w Nicotine Patch   -As Above  Anxiety/Depression -Continue Prozac  Hypomagnesemia -Mag Level this AM was 1.8 -Continue to Monitor and Replete as Necessary -Repeat Mag Level in AM   Right Ankle Swelling  -Foot Xray shows No fracture or malalignment is seen. The ankle mortise is symmetric. Soft tissue swelling medially. Large plantar calcaneal spur.Probable old cortical avulsion is a anterior to the talus -Recommended Elevating ankle along with ICE  DVT prophylaxis: Enoxaparin 40 mg sq q24h Code Status: FULL CODE Family Communication: Son at bedside Disposition Plan: SNF when medically stable  Consultants:   Cardiology  Neurosurgery  PCCM  Neurology    Procedures:  ECHOCARDIOGRAM Study Conclusions  - Left ventricle: The cavity size was normal. Wall thickness was   increased in a pattern of moderate LVH. Systolic function was   moderately to severely reduced. The estimated ejection fraction   was in the range of 30% to 35%. Diffuse hypokinesis. Features are   consistent with a pseudonormal left ventricular filling pattern,   with concomitant abnormal relaxation and increased filling   pressure (grade 2 diastolic dysfunction). - Mitral valve: There was moderate regurgitation directed   posteriorly. - Left atrium: The atrium was mildly dilated.  EEG Impression: This awake and drowsy EEG is mildly abnormal due to mild diffuse slowing.    Clinical Correlation: The above finding is indicative  of diffuse cerebral dysfunction, which is nonspecific and may be secondary to hypoxic, toxic-metabolic or other diffuse physiologic abnormality.  Clinical correlation advised   Antimicrobials:  Anti-infectives    Start     Dose/Rate Route Frequency Ordered Stop   06/24/17 0800  ceFEPIme (MAXIPIME) 2 g in dextrose 5 % 50 mL IVPB     2 g 100 mL/hr over 30 Minutes Intravenous Every 24 hours 06/24/17 0749       Subjective: Seen and examined at bedside this AM and family was at bedside. Stated she had Right ankle swelling since she slipped in the rain walking to the liquor store. States she feels better. States she feels SOB and wants to keep using her inhaler but refusing Breathing treaments. No nausea or vomiting and wants to go to the [facility].   Objective: Vitals:   06/28/17 1625 06/28/17 1758 06/28/17 1924 06/28/17 2119  BP:  (!) 147/105  107/73  Pulse:  86  80  Resp:  16  16  Temp:  (!) 97.5 F (36.4 C)  98.3 F (36.8 C)  TempSrc:  Oral  Oral  SpO2: 100% 96% 96% 96%  Weight:      Height:        Intake/Output Summary (Last 24 hours) at 06/28/17 2154 Last data filed at 06/28/17 1500  Gross per  24 hour  Intake           398.33 ml  Output                0 ml  Net           398.33 ml   Filed Weights   06/25/17 0424 06/26/17 0500 06/28/17 0500  Weight: 70 kg (154 lb 5.2 oz) 72.2 kg (159 lb 3.2 oz) 76.2 kg (167 lb 14.4 oz)   Examination: Physical Exam:  Constitutional: WN/WD AAF in NAD appears calm and comfortable laying in bed with family at bedside.  Eyes: Sclerae anicteric. Conjunctivae non-injected ENMT: Grossly normal hearing. External ears and nose appear normal. Mucous Membranes appear moist Neck: Supple with no JVD Respiratory: Diminished to auscultation but no wheezing. Has some mild crackles. Not tachypenic or using any accessory muscles to breathe. Cardiovascular: RRR; S1 S2; No LE edema but Right ankle is swollen Abdomen: Soft, NT, ND. Bowel Sounds  positive GU: Deferred Musculoskeletal: No contractures; No cyanosis; Has Right Ankle Swelling Skin: Warm and Dry. No rashes or lesions on a limited skin eval Neurologic: CN 2-12 grossly intact; Has weakness Psychiatric: Histrionic female. Tangential and at times manic  Data Reviewed: I have personally reviewed following labs and imaging studies  CBC:  Recent Labs Lab 06/24/17 0248 06/25/17 0248 06/26/17 1015 06/27/17 0619 06/28/17 0218  WBC 18.0* 18.2* 13.3* 10.7* 7.1  NEUTROABS 15.4*  --  11.8* 9.0* 5.4  HGB 13.6 15.9* 13.2 11.7* 12.0  HCT 42.7 49.4* 40.7 36.8 37.9  MCV 98.2 100.4* 97.4 96.6 97.7  PLT 213 211 212 190 785   Basic Metabolic Panel:  Recent Labs Lab 06/22/17 0337  06/24/17 0248  06/25/17 0557 06/25/17 1547 06/26/17 1015 06/27/17 0619 06/28/17 0218  NA 140  < > 140  < > 141 140 141 143 140  K 3.2*  < > 5.0  < > 5.3* 4.5 4.1 4.0 4.3  CL 107  < > 108  < > 112* 108 108 111 110  CO2 26  < > 23  < > 19* 21* 23 23 23   GLUCOSE 107*  < > 191*  < > 125* 315* 196* 102* 151*  BUN 20  < > 26*  < > 32* 31* 27* 25* 26*  CREATININE 1.36*  < > 1.89*  < > 1.99* 2.02* 1.82* 1.60* 1.83*  CALCIUM 8.2*  < > 8.5*  < > 8.2* 8.1* 8.2* 8.1* 8.1*  MG 1.9  --  1.8  --   --   --  1.6* 1.8 1.8  PHOS  --   --  3.0  --   --   --  2.9 3.0 2.5  < > = values in this interval not displayed. GFR: Estimated Creatinine Clearance: 33.2 mL/min (A) (by C-G formula based on SCr of 1.83 mg/dL (H)). Liver Function Tests:  Recent Labs Lab 06/24/17 0248 06/27/17 0619 06/28/17 0218  AST 21 18 29   ALT 23 21 24   ALKPHOS 154* 132* 138*  BILITOT 1.3* 0.8 0.9  PROT 5.3* 4.6* 5.3*  ALBUMIN 2.2* 1.7* 2.0*   No results for input(s): LIPASE, AMYLASE in the last 168 hours.  Recent Labs Lab 06/24/17 2156  AMMONIA 63*   Coagulation Profile: No results for input(s): INR, PROTIME in the last 168 hours. Cardiac Enzymes:  Recent Labs Lab 06/22/17 0337 06/23/17 0631  CKTOTAL  --  40  CKMB   --  5.2*  TROPONINI 0.41*  --  BNP (last 3 results) No results for input(s): PROBNP in the last 8760 hours. HbA1C: No results for input(s): HGBA1C in the last 72 hours. CBG:  Recent Labs Lab 06/27/17 2052 06/28/17 0709 06/28/17 1123 06/28/17 1609 06/28/17 2116  GLUCAP 111* 194* 162* 80 267*   Lipid Profile: No results for input(s): CHOL, HDL, LDLCALC, TRIG, CHOLHDL, LDLDIRECT in the last 72 hours. Thyroid Function Tests: No results for input(s): TSH, T4TOTAL, FREET4, T3FREE, THYROIDAB in the last 72 hours. Anemia Panel: No results for input(s): VITAMINB12, FOLATE, FERRITIN, TIBC, IRON, RETICCTPCT in the last 72 hours. Sepsis Labs:  Recent Labs Lab 06/24/17 0248  06/25/17 0248 06/25/17 0557 06/25/17 1045 06/26/17 1015 06/26/17 1413  PROCALCITON 0.48  --   --  0.73  --   --   --   LATICACIDVEN  --   < > 3.9*  --  2.6* 2.9* 2.7*  < > = values in this interval not displayed.  Recent Results (from the past 240 hour(s))  MRSA PCR Screening     Status: None   Collection Time: 06/23/17  8:23 PM  Result Value Ref Range Status   MRSA by PCR NEGATIVE NEGATIVE Final    Comment:        The GeneXpert MRSA Assay (FDA approved for NASAL specimens only), is one component of a comprehensive MRSA colonization surveillance program. It is not intended to diagnose MRSA infection nor to guide or monitor treatment for MRSA infections.     Radiology Studies: Dg Ankle Complete Right  Result Date: 06/28/2017 CLINICAL DATA:  Fall, broke right ankle EXAM: RIGHT ANKLE - COMPLETE 3+ VIEW COMPARISON:  11/11/2009 FINDINGS: No fracture or malalignment is seen. The ankle mortise is symmetric. Soft tissue swelling medially. Large plantar calcaneal spur. Probable old cortical avulsion is a anterior to the talus. IMPRESSION: 1. Soft tissue swelling. No definite acute osseous abnormality. Possible old cortical avulsion anterior surface of the talus Electronically Signed   By: Donavan Foil M.D.    On: 06/28/2017 14:00   Scheduled Meds: .  stroke: mapping our early stages of recovery book   Does not apply Once  . aspirin EC  81 mg Oral Daily  . clopidogrel  75 mg Oral Q breakfast  . cycloSPORINE  1 drop Both Eyes BID  . enoxaparin (LOVENOX) injection  30 mg Subcutaneous Q24H  . ezetimibe  10 mg Oral Daily  . fenofibrate  160 mg Oral Daily  . folic acid  1 mg Oral Daily  . guaiFENesin  1,200 mg Oral BID  . hydrocortisone valerate cream   Topical BID  . insulin aspart  0-5 Units Subcutaneous QHS  . insulin aspart  0-9 Units Subcutaneous TID WC  . ipratropium-albuterol  3 mL Nebulization BID  . lidocaine  1 patch Transdermal Q24H  . living well with diabetes book   Does not apply Once  . LORazepam  0-4 mg Intravenous Q12H  . mometasone-formoterol  2 puff Inhalation BID  . multivitamin with minerals  1 tablet Oral Daily  . nicotine  7 mg Transdermal Daily  . pantoprazole  40 mg Oral Daily  . rosuvastatin  40 mg Oral Daily  . sodium chloride flush  3 mL Intravenous Q12H  . thiamine  100 mg Oral Daily   Or  . thiamine  100 mg Intravenous Daily  . Vitamin D (Ergocalciferol)  50,000 Units Oral Weekly   Continuous Infusions: . ceFEPime (MAXIPIME) IV Stopped (06/28/17 0900)  . sodium chloride  LOS: 7 days    Kerney Elbe, DO [facility] Pager [phone]  If 7PM-7AM, please contact night-coverage [email or web address] Password [facility] 06/28/2017, 9:54 PM

## 2017-06-28 NOTE — Progress Notes (Signed)
Took over patient care around 2330 will continue to monitor.

## 2017-06-28 NOTE — Progress Notes (Signed)
PT Cancellation Note  Patient Details Name: MICHOLE LECUYER MRN: [id number] DOB: [birth year]   Cancelled Treatment:    Reason Eval/Treat Not Completed: Patient declined, reporting breathing issues.  RN made aware via call bell, awaiting new inhaler.  PT to check on tomorrow.  Thanks,    [name]. [place], [place], DPT [phone]   06/28/2017, 2:18 PM

## 2017-06-28 NOTE — Progress Notes (Signed)
Patient was acting very condescending and demonstrative towards staff this evening.  Nurse approached patient about being rude with RN and NT, patient flipped out acting erratic and cursing.  Patient refused to look or acknowledge nurse when trying to assess patient and give medications.  Charge nurse was called in and patient reassigned to another nurse.

## 2017-06-28 NOTE — Progress Notes (Signed)
Patient has been seen by Dr. Cyndy Freeze Patient has significant stenosis at L3-L4 Her medical condition however has been very unstable Exam today demonstrates that she has good strength in her lower extremities She complains randomly of pain in her back but not so much of a direct radicular pain in either lower extremity Medically the patient is not very stable Elderly to Dr. Hewitt Shorts that he may follow-up regarding question of surgical appropriateness

## 2017-06-28 NOTE — Clinical Social Work Note (Signed)
Clinical Social Work Assessment  Patient Details  Name: [name] MRN: [id number] Date of Birth: [birth year]  Date of referral:  06/28/17               Reason for consult:  Facility Placement, Discharge Planning                Permission sought to share information with:  Facility Sport and exercise psychologist, Family Supports Permission granted to share information::  Yes, Verbal Permission Granted  Name::     [name], [name]  Agency::  SNF  Relationship::  Siblings, Children  Contact Information:     Housing/Transportation Living arrangements for the past 2 months:  Apartment Source of Information:  Patient, Adult Children, Other (Comment Required) (Siblings) Patient Interpreter Needed:  None Criminal Activity/Legal Involvement Pertinent to Current Situation/Hospitalization:  No - Comment as needed Significant Relationships:  Adult Children, Siblings, Other Family Members Lives with:  Self Do you feel safe going back to the place where you live?  Yes Need for family participation in patient care:  No (Coment)  Care giving concerns:  Patient currently lives at home alone, and requires increased support and care at this time from short term rehab prior to returning home.   Social Worker assessment / plan:  CSW met with patient, patient's siblings ([name]-brother, Recruitment consultant), and patient's daughter [name]) at bedside to discuss discharge planning. Patient's family expressed concern about discussing discharge plan when they haven't even spoken to the doctor about what's going on. Patient's family indicated they had been waiting for the doctor to come speak with them; CSW paged MD to make him aware. Patient's family agreed to discuss rehab options, and preference for [name]. CSW contacted [name]; all admissions representatives are out of the office today. CSW sent over referral and will follow up tomorrow when someone is in the office to  discuss.   Employment status:  Retired Nurse, adult PT Recommendations:  [place] / Referral to community resources:  [place]  Patient/Family's Response to care:  Patient is agreeable to SNF placement.  Patient/Family's Understanding of and Emotional Response to Diagnosis, Current Treatment, and Prognosis:  Patient and family are aware that patient needs increased assistance at this time, and are appreciative of CSW assistance in coordinating rehab placement. Patient and family discussed feeling frustrated that they had been waiting for the doctor, but indicated understanding that the message had probably not been given to the doctor that the family was waiting to speak to him.   Emotional Assessment Appearance:  Appears stated age Attitude/Demeanor/Rapport:    Affect (typically observed):  Appropriate Orientation:  Oriented to Self, Oriented to Place, Oriented to  Time, Oriented to Situation Alcohol / Substance use:  Not Applicable Psych involvement (Current and /or in the community):  No (Comment)  Discharge Needs  Concerns to be addressed:  Care Coordination Readmission within the last 30 days:  No Current discharge risk:  Lives alone, Physical Impairment Barriers to Discharge:  Continued Medical Work up   [employer], [place] 06/28/2017, 12:26 PM
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
## 2017-06-28 NOTE — Care Management Important Message (Signed)
Important Message  Patient Details  Name: [name] MRN: [id number] Date of Birth: Jan 23, 1966   Medicare Important Message Given:  Yes    Krystel Fletchall 06/28/2017, 11:21 AM

## 2017-06-29 ENCOUNTER — Inpatient Hospital Stay (HOSPITAL_COMMUNITY): Payer: Medicare Other

## 2017-06-29 ENCOUNTER — Encounter (HOSPITAL_COMMUNITY): Payer: Self-pay

## 2017-06-29 DIAGNOSIS — M79609 Pain in unspecified limb: Secondary | ICD-10-CM

## 2017-06-29 LAB — GLUCOSE, CAPILLARY
Glucose-Capillary: 210 mg/dL — ABNORMAL HIGH (ref 65–99)
Glucose-Capillary: 156 mg/dL — ABNORMAL HIGH (ref 65–99)
Glucose-Capillary: 128 mg/dL — ABNORMAL HIGH (ref 65–99)
Glucose-Capillary: 119 mg/dL — ABNORMAL HIGH (ref 65–99)

## 2017-06-29 LAB — CBC WITH DIFFERENTIAL/PLATELET
Basophils Absolute: 0 10*3/uL (ref 0.0–0.1)
Basophils Relative: 0 %
Eosinophils Absolute: 0.1 10*3/uL (ref 0.0–0.7)
Eosinophils Relative: 1 %
HCT: 37.6 % (ref 36.0–46.0)
Hemoglobin: 11.6 g/dL — ABNORMAL LOW (ref 12.0–15.0)
Lymphocytes Relative: 21 %
Lymphs Abs: 1.1 10*3/uL (ref 0.7–4.0)
MCH: 30.2 pg (ref 26.0–34.0)
MCHC: 30.9 g/dL (ref 30.0–36.0)
MCV: 97.9 fL (ref 78.0–100.0)
Monocytes Absolute: 0.3 10*3/uL (ref 0.1–1.0)
Monocytes Relative: 6 %
Neutro Abs: 3.9 10*3/uL (ref 1.7–7.7)
Neutrophils Relative %: 72 %
Platelets: 208 10*3/uL (ref 150–400)
RBC: 3.84 MIL/uL — ABNORMAL LOW (ref 3.87–5.11)
RDW: 15 % (ref 11.5–15.5)
WBC: 5.4 10*3/uL (ref 4.0–10.5)

## 2017-06-29 LAB — COMPREHENSIVE METABOLIC PANEL
ALT: 24 U/L (ref 14–54)
AST: 30 U/L (ref 15–41)
Albumin: 2 g/dL — ABNORMAL LOW (ref 3.5–5.0)
Alkaline Phosphatase: 125 U/L (ref 38–126)
Anion gap: 9 (ref 5–15)
BUN: 24 mg/dL — ABNORMAL HIGH (ref 6–20)
CO2: 22 mmol/L (ref 22–32)
Calcium: 8.5 mg/dL — ABNORMAL LOW (ref 8.9–10.3)
Chloride: 111 mmol/L (ref 101–111)
Creatinine, Ser: 1.89 mg/dL — ABNORMAL HIGH (ref 0.44–1.00)
GFR calc Af Amer: 34 mL/min — ABNORMAL LOW (ref >60)
GFR calc non Af Amer: 29 mL/min — ABNORMAL LOW (ref >60)
Glucose, Bld: 138 mg/dL — ABNORMAL HIGH (ref 65–99)
Potassium: 4.2 mmol/L (ref 3.5–5.1)
Sodium: 142 mmol/L (ref 135–145)
Total Bilirubin: 0.5 mg/dL (ref 0.3–1.2)
Total Protein: 5.2 g/dL — ABNORMAL LOW (ref 6.5–8.1)

## 2017-06-29 LAB — MAGNESIUM: Magnesium: 1.7 mg/dL (ref 1.7–2.4)

## 2017-06-29 LAB — PHOSPHORUS: Phosphorus: 2.5 mg/dL (ref 2.5–4.6)

## 2017-06-29 MED ORDER — TIOTROPIUM BROMIDE MONOHYDRATE 18 MCG IN CAPS
18.0000 ug | ORAL_CAPSULE | Freq: Every day | RESPIRATORY_TRACT | Status: DC
  Administered 2017-06-29 – 2017-06-30 (×2): 18 ug via RESPIRATORY_TRACT
  Filled 2017-06-29: qty 5

## 2017-06-29 MED ORDER — FLUTICASONE PROPIONATE 50 MCG/ACT NA SUSP
2.0000 | Freq: Every day | NASAL | Status: DC
  Administered 2017-06-30: 2 via NASAL
  Filled 2017-06-29: qty 16

## 2017-06-29 MED ORDER — MONTELUKAST SODIUM 10 MG PO TABS
10.0000 mg | ORAL_TABLET | Freq: Every day | ORAL | Status: DC
  Administered 2017-06-29: 10 mg via ORAL
  Filled 2017-06-29: qty 1

## 2017-06-29 MED ORDER — ALBUTEROL SULFATE HFA 108 (90 BASE) MCG/ACT IN AERS: 1.0000 | INHALATION_SPRAY | Freq: Four times a day (QID) | RESPIRATORY_TRACT | Status: DC | PRN

## 2017-06-29 MED ORDER — ALBUTEROL SULFATE (2.5 MG/3ML) 0.083% IN NEBU
2.5000 mg | INHALATION_SOLUTION | Freq: Two times a day (BID) | RESPIRATORY_TRACT | Status: DC
  Administered 2017-06-29 – 2017-06-30 (×2): 2.5 mg via RESPIRATORY_TRACT
  Filled 2017-06-29 (×2): qty 3

## 2017-06-29 MED ORDER — DICLOFENAC SODIUM 1 % TD GEL
2.0000 g | Freq: Four times a day (QID) | TRANSDERMAL | Status: DC
  Administered 2017-06-29 – 2017-06-30 (×4): 2 g via TOPICAL
  Filled 2017-06-29: qty 100

## 2017-06-29 NOTE — Progress Notes (Signed)
CSW following to facilitate discharge to SNF. Patient's preferred facility, [facility], is not able to accept patient at this time. CSW met with patient and discussed with patient's sister over the phone to determine other options. CSW sent referrals to [facility] and [facility], and will follow up with information when received.  Laveda Abbe, [place] Clinical Social Worker [phone]

## 2017-06-29 NOTE — Progress Notes (Signed)
Patient ID: [name], female   DOB: [birth year], 51 y.o.   MRN: [id number] BP (!) 135/95 (BP Location: Right Arm)   Pulse (!) 105   Temp 97.9 F (36.6 C) (Oral)   Resp 20   Ht 5\' 1"  (1.549 m)   Wt 76.3 kg (168 lb 3.2 oz)   LMP 05/19/2016   SpO2 100%   BMI 31.78 kg/m  Alert and oriented x 4 Moving lower extremities well Will see after stint in rehab Does not need surgery at this time.

## 2017-06-29 NOTE — Progress Notes (Addendum)
CSW following to facilitate discharge planning. CSW provided list of bed offers to patient, as none of her preferred facilities in [place] were able to offer a bed; all facilities are located in [place]. Patient was agreeable; indicated she'd discuss with family tonight and determine facility choice tomorrow.  CSW will follow up and facilitate discharge when medically ready.  Laveda Abbe, LCSW Clinical Social Worker [phone]  UPDATE 4:20 PM:  CSW contacted patient's sister, Junious Dresser, to provide updated list of bed offers. Patient's sister confirmed ability to coordinate with all siblings and determine facility choice by tomorrow. CSW to follow up.  Laveda Abbe, [place] Clinical Social Worker [phone]

## 2017-06-29 NOTE — Progress Notes (Signed)
Inpatient Diabetes Program Recommendations  AACE/ADA: New Consensus Statement on Inpatient Glycemic Control (2015)  Target Ranges:  Prepandial:   less than 140 mg/dL      Peak postprandial:   less than 180 mg/dL (1-2 hours)      Critically ill patients:  140 - 180 mg/dL   Lab Results  Component Value Date   GLUCAP 210 (H) 06/29/2017   HGBA1C >15.5 (H) 06/25/2017    Review of Glycemic Control   Results for [name], [name] (MRN [id number]) as of 06/29/2017 10:34  Ref. Range 06/28/2017 07:09 06/28/2017 11:23 06/28/2017 16:09 06/28/2017 21:16 06/29/2017 06:30  Glucose-Capillary Latest Ref Range: 65 - 99 mg/dL 194 (H) 162 (H) 80 267 (H) 210 (H)   Home DM Meds: Tradjenta 5 mg daily                             Prandin 1 mg TID  Current Insulin Orders: Novolog sensitive correction 0-9 units tid, Novolog 0-5 units qhs  Agree with current medications for blood sugar management.    Gentry Fitz, RN, BA, MHA, CDE Diabetes Coordinator Inpatient Diabetes Program  [phone] (Team Pager) [phone] ([place]) 06/29/2017 10:41 AM

## 2017-06-29 NOTE — Progress Notes (Signed)
SLP Cancellation Note  Patient Details Name: [name] MRN: [id number] DOB: [birth year]   Cancelled treatment:       Reason Eval/Treat Not Completed: Patient at procedure or test/unavailable   Angell Pincock 06/29/2017, 2:01 PM

## 2017-06-29 NOTE — Progress Notes (Signed)
Physical Therapy Treatment Patient Details Name: [name] MRN: [id number] DOB: 1966-06-17 Today's Date: 06/29/2017    History of Present Illness 51 y.o. female with medical history significant of dilated cardiomyopathy with ejection fraction of 20-25%, diabetes mellitus type 2 uncontrolled with chronic kidney disease stage III, uncontrolled hypertension, cocaine abuse, tobacco abuse, chronic obstructive pulmonary disease, hyperlipidemia, and medical noncompliance who presents the emergency department stating that she can no longer walk her legs won't hold her up. after transfer to ICU due to change in MS pt dx with acute hpoxic resp failure, non-ischemic cardiomyopathy, mixed metabolic acidosis, resp acidosis, hyperkalemia, R lung infiltrate, and encephalopathy.  Neurosurgery following but surgery deferred intiatlly due to medical issues and mentation change.  Pt with HNP at L3/4.  Pt with other significant PMH of panic attacks, hypertensivecardiomyopathy, DM, essential HTN, diabetic charcot foot, COPD, CKD, ankle fx, alcohol and cocaine use.      PT Comments    On arrival to room pt c/o recent onset of calf pain. Unsafe to mobalize due to possible DVT. However, pt insistant on using restroom. Steady present in room and utilized to assist pt to bathroom. Pt returned to bed and RN notified of calf pain and positive homen's sign.    Follow Up Recommendations  SNF     Equipment Recommendations  Rolling walker with 5" wheels;3in1 (PT)    Recommendations for Other Services       Precautions / Restrictions Precautions Precautions: Fall Restrictions Weight Bearing Restrictions: No    Mobility  Bed Mobility Overal bed mobility: Needs Assistance Bed Mobility: Sit to Supine       Sit to supine: Min guard   General bed mobility comments: Pt in long sitting on arrival, min guard to progress hips and LEs EOB. Min guard to return to supine.  Transfers Overall transfer level: Needs  assistance   Transfers: Sit to/from Stand Sit to Stand: Min assist         General transfer comment: Steady used as pt c/o recent onset of calf pain. Unsafe to mobalize due to possible DVT. However, pt insistant on using restroom and steady was present in room.  Ambulation/Gait         Gait velocity: decreased Gait velocity interpretation: Below normal speed for age/gender General Gait Details: unsafe to attempt as pt having calf pain and positive homens sign   Stairs            Wheelchair Mobility    Modified Rankin (Stroke Patients Only)       Balance Overall balance assessment: Needs assistance Sitting-balance support: Feet supported;No upper extremity supported Sitting balance-Leahy Scale: Good Sitting balance - Comments: long sitting in bed on arrival   Standing balance support: Bilateral upper extremity supported;During functional activity Standing balance-Leahy Scale: Poor Standing balance comment: needs RW and external support.                             Cognition Arousal/Alertness: Awake/alert Behavior During Therapy: Restless Overall Cognitive Status: Within Functional Limits for tasks assessed                         Following Commands: Follows one step commands consistently              Exercises      General Comments General comments (skin integrity, edema, etc.): Pt requires assist with peri care.  Pertinent Vitals/Pain Pain Assessment: Faces Faces Pain Scale: Hurts little more Pain Location: R Calf Pain Descriptors / Indicators: Cramping Pain Intervention(s): Monitored during session    Home Living                      Prior Function            PT Goals (current goals can now be found in the care plan section) Acute Rehab PT Goals PT Goal Formulation: With patient Time For Goal Achievement: 07/09/17 Potential to Achieve Goals: Good Progress towards PT goals: Progressing toward  goals    Frequency    Min 3X/week      PT Plan Current plan remains appropriate    Co-evaluation              AM-PAC PT "6 Clicks" Daily Activity  Outcome Measure  Difficulty turning over in bed (including adjusting bedclothes, sheets and blankets)?: None Difficulty moving from lying on back to sitting on the side of the bed? : None Difficulty sitting down on and standing up from a chair with arms (e.g., wheelchair, bedside commode, etc,.)?: Unable Help needed moving to and from a bed to chair (including a wheelchair)?: A Little Help needed walking in hospital room?: A Little Help needed climbing 3-5 steps with a railing? : A Lot 6 Click Score: 17    End of Session Equipment Utilized During Treatment: Gait belt Activity Tolerance: Other (comment) (limited by possible DVT) Patient left: in bed;with call bell/phone within reach Nurse Communication: Mobility status;Other (comment) (Calf pain and positive homans sign) PT Visit Diagnosis: Muscle weakness (generalized) (M62.81);Difficulty in walking, not elsewhere classified (R26.2)     Time: 5400-8676 PT Time Calculation (min) (ACUTE ONLY): 28 min  Charges:  $Therapeutic Activity: 23-37 mins                    G Codes:       Benjiman Core, [place] Pager [phone] Acute Rehab   Allena Katz 06/29/2017, 11:44 AM

## 2017-06-29 NOTE — Progress Notes (Signed)
PROGRESS NOTE    [name]  TKW:[id number] DOB: Feb 18, 1966 DOA: 06/21/2017 PCP: [name], MD   Brief Narrative:  [name] a 51 y.o.femalewith medical history significant of dilated cardiomyopathy with previous ejection fraction of 20-25% now 30-35%, diabetes mellitus type 2, uncontrolled with chronic kidney disease stage III, uncontrolled hypertension, cocaine abuse, tobacco abuse, chronic obstructive pulmonary disease, hyperlipidemia, and medical noncompliance who presents the emergency department stating that she can no longer walk her legs won't hold her up. Patient states that she has not been taking any of her medications for her heart in 2 months. She stopped her diabetes medications 2 weeks ago. She lives by herself in a motel. Home health was coming to help her until the home health company went out of business. She is usually able to walk over the last couple of days she's become increasingly weak late last night or very early this morning she was unable to get up off the commode because of bilateral leg weakness. She slid to the ground and crawled out to the main area of the motel room where she remained in a crosslegged sitting pattern until family came approximately 12 hours later. She didn't call 911 because she did not want to come to the hospital.   Ultimately when family found her and they convinced her come into the emergency department. She has complaints of increased swelling in her legs (not taking her Lasix) and shortness of breath. She feels generally ill which has been progressive and now become severe. She denies any fevers. She has had poor urine output recently not making much urine at all. She has increased thirst. She has no chest pain she denies palpitations. She says she has serious chronic low back pain and this has not worsened but the weakness is severe. Also for past few days she has been having intermittent myoclonic jerking which lasted  about 30 seconds and resolves. She has no other shaking. He has made her symptoms better nothing has made them worse. Symptoms have been chronic and worsening since they developed. She was admitted for Elevated Troponin, CHF Exacerbation, and Back Pain with inability to walk.   On the afternoon of 06/23/17 a Rapid Response was called as the patient became Hypoxic and desaturated to 79% on 6 Liters. Patient was also Hypotensive and confused. She was bolused 250 mL of NS and had her Antihypertensives stopped. Saturations eventually came up.  An ABG was obtained which was unremarkable, a Stat Portable CXR which was normal, and patient was also given 1x of Narcan. Patient was placed on CIWA protocol, ordered a Head CT Scan for her confusion/sleepiness and was also ordered a CTA Chest to r/o PE (reduced Contrast dose). PCCM was consulted to evaluate for patient's Hypoxia episode and patient was transferred to SDU.  On 06/24/17 patient was doing well and this afternoon nurse paged stating the patient became confused again and dropped her BP. Patient's BP improved after a bolus was ordered and ABG done was unremarkable. Because the patient's mentation had changed I went to the bedside and it was very difficult to arouse her so I ordered an EEG and MRI of the Head. Neurology was consulted and evaluated and took the patient down for a STAT MRI and patient was found to have multiple strokes likely embolic in nature. After the patient returned from MRI she started having periods of apnea with [place]-Stokes Breathing. Neurology was present at bedside with me and felt that the change  in breathing was unrelated to the CVA's. I called PCCM eLink Dr. Ashok Cordia who recommended the patient be placed on BiPAP and given Narcan. PCCM came to evaluate the patient and because of patient's tenuous respiratory status was transferred to ICU to [facility] service. Patient remained on PCCM Service and was improved. Neurology Stroke Team evaluated  and recommended DAPT for 3 months.  Patient was transferred back to [facility] Service 06/26/17 and was doing better but still acting confused a little bit and hallucinating so she was placed back on CIWA.   She is was evaluated by PT/OT and they recommend SNF. Neuro surgery came to evaluate the patient and feel like she demonstrates good strength in her lower extremities without radicular pain and feel like she does not need surgery at this time and will see after she is out of Rehab. Social worker working on [facility] placement and patient hoping for [employer] in [place] but there have been no offers in [place]. Family to decide on SNF and let CSW in AM.   Patient's Right ankle was swollen after she feel on her way to the liquor store prior to admit and X-ray showed no fractures on imaging. Patient also complained of LE pain and so LE Duplex was ordered and showed Bilateral lower extremities are negative for acute deep vein thrombosis. Bilateral lower extremity veins exhibit pulsatile flow, suggestive of possible elevated right sided heart pressure. There is no evidence of right Baker's cyst. There is evidence of left Baker's cyst. Patient was started on Voltaren Gel for Leg Pain. Patient getting close to D/C and likley D/C in next 24-48 hours if medically stable.   Assessment & Plan:   Principal Problem:   Elevated troponin Active Problems:   Medical non-compliance   Hypokalemia   Acute respiratory failure with hypoxia (HCC)   CKD (chronic kidney disease) stage 3, GFR 30-59 ml/min   Polysubstance abuse (including cocaine)   Cigarette nicotine dependence with nicotine-induced disorder   Hyperlipidemia LDL goal <70   Cardiomyopathy-etiology not yet determined (suspect hypertensive)   Severe left ventricular systolic dysfunction   Spinal stenosis of lumbar region   DM (diabetes mellitus), type 2, uncontrolled, with renal complications (HCC)   Acute systolic (congestive) heart failure (HCC)    Cerebral thrombosis with cerebral infarction   Cocaine abuse  Hypoxia Episode with Confusion/AMS and Hypotension and periods of Apnea likley from Substance abuse ? If patient took something illicit in the hospital supplied by family, improved  -Desaturated to 79% on 6 Liters and eventually came back to the 90's after being confused and hypotensive on 8/29 -Became Altered again and hypotensive on 8/30 -ABG showed 7.432/30.5/104/20.0/98.6% on 2 Liters -Not on Home O2; C/w supplemental O2 as needed and may need BiPAP -Obtained Head CT w/o Contrast on 06/23/17 and showed No acute intracranial abnormality identified. Mild chronic microvascular ischemic changes and mild parenchymal volume loss of the brain. Small chronic lacunar infarcts in left thalamus and left caudate head. -Obtained CTA of Chest to r/o PE (reduced dose) and showed No evidence of pulmonary embolism. Patchy infiltrates in RIGHT lung question pneumonia versus asymmetric edema. Subsegmental atelectasis LEFT lower lobe. Enlargement of cardiac chambers with upper normal caliber of the ascending thoracic aorta. -Patient became confused and somnolent and had a difficult time rousing from sleep so EEG and MRI was ordered. Consulted Neurology Dr. Leonel Ramsay and took the patient down for a Stat Head MRI -Official MRI report pending but per Dr. Cecil Cobbs interpretation patient had embolic CVA's -  Patient began having periods of Apnea so PCCM was re-consulted and Dr. Ashok Cordia advised BiPAP and Narcan -Held Antihypertensives including BB and Losartan  -Started patient on scheduled Nebs -Given 1x of Narcan with no improvement and repeated -Placed on CIWA Protocol for ? EtOH Witdrawal; C/w MVI, Folic Acid, and Thiamine -Patient's Mental Status Improved. PCCM Signed Off -Repeat UDS + for Cocaine and THC -C/w Narcan prn   Acute CVA's likley due to Hypotension; Cannot r/o Cardioembolic Infarcts -As Above -Obtained Head CT w/o Contrast on 06/23/17  and showed No acute intracranial abnormality identified. Mild chronic microvascular ischemic changes and mild parenchymal volume loss of the brain. Small chronic lacunar infarcts in left thalamus and left caudate head. -MRI of Head Showed Multifocal subcentimeter acute ischemic infarcts involving the bilateral cerebral hemispheres as above. Findings are most likely embolic in nature. No associated hemorrhage or mass effect. Moderate chronic microvascular ischemic disease with additional remote infarcts involving the left basal ganglia, left thalamus, and bilateral cerebellar hemispheres. -MRA of the Head showed Negative intracranial MRA for large vessel occlusion. Moderate atherosclerotic disease involving the anterior posterior circulation as above, most notable at the distal right M1 segment -Stroke order Set utilized -Recent ECHO as below -EEG was mildly abnormal due to mild diffuse slowing -Carotids showed No significant (1-39%) ICA stenosis. Antegrade vertebral flow.  -Lipid Panel showed Cholesterol of 164, HDL of 51, LDL of 96, TG of 87, and VLDL of 17 -HbA1c was >15.5  -Neurology recommending DAPT for 3 months and then Plavix alone for after -PT/OT recommending SNF -Cardiology does not think she would be a candidate for an implantable loop recorder at this time -Recommend considering a 30 day monitor at the time of discharge and resume Bisoprolol when able   Elevated Troponin -Likely demand ischemia in the Setting of Cocaine Use -Troponin 0.32, 0.46, 0.41 (relatively flat) -Denied chest pain -Cardiology consulted appreciated -Echocardiogram showed  Systolic function was moderately to severely reduced. The estimated ejection fraction was in the range of 30% to 35%. Diffuse hypokinesis. Features are consistent with a pseudonormal left ventricular filling pattern, with concomitant abnormal relaxation and increased filling pressure (grade 2 diastolic dysfunction). -Patient had heart cath  07/28/2016 which showed no apparent coronary artery disease, continue aggressive medical therapy and both pressure control -Patient currently on Aspirin and Plavix and will continue   Acute systolic CHF exacerbation -BNP on admission 1152 -CXR on Admission showed mild pulmonary vascular congestion -Last echocardiogram 05/26/2016 showed an EF of 20-25%, restrictive physiology, decreased left ventricular diastolic compliance and or increased left atrial pressure -Repeat ECHO showed improved EF -IV Lasix 40 mg Daily Held because of Hypotension and was given IVF. IVF now D/C'd  -Cardiology restarted BB and Losartan on 8/29 however patient became Hypotensive so they were held -Cardiology recommending resuming Bisoprolol and Add ARB as BP allows -Monitor intake and output, daily weights; Patient is +2.540 Liters -Cardiology Signed off -Restarted Home Lasix yesterday   Back pain with spinal stenosis of the lumbar region -MRI showed L3-L4 canal stenosis of disorganized cauda equina -Neurosurgery consultation appreciated and originally patient was supposed to go for Lumbar Discectomy/Decompression for Herniated Disc at Levels L3/4 but unclear whether she will go or not; Cardiology cleared patient -Discussed Case with Dr. Ellene Route and patient unlikely to undergo surgical procedure -Continue pain control with Lidocaine Patch and added IV Fentanyl 12.5 mcg q6hprn -Dr. Christella Noa evaluated and recommending no Surgery at this time and will re-evaluate after SNF  Diabetes mellitus, type II, complicated  by hyperglycemia -On arrival to emergency department, blood sugars over 600 -HbA1c >15.5 -Continue Novolog SSI and CBG monitoring -Patient uses Prandin and Tradjenta at home, will hold for now -Diabetic Education Coordinator Consulted  -CBG's ranging from 119-210  Hypokalemia, improved -Replete.Continue to monitor BMP  Leukocytosis -? Pneumonia in Right Lung vs Cocaine Use -CXR showed Stable  cardiomegaly without edema, significant collapse or consolidation by plain radiography. No effusion or pneumothorax. Trachea is midline. Aorta is atherosclerotic and ectatic. No significant interval change. Stable mild hyperinflation -Added Mucinex and Tessalon Pearls for cough -WBC went from 8.1 -> 11.2 -> 18.0 -> 18.2 -> 13.3 -> 10.7 -> 7.1 -> 5.4 -C/w Abx with IV Cefepime Day 6 (one day was missed and unsure why) -Continue to Monitor and repeat CMP in AM along with CXR   Lactic Acidosis -Went from 4.3 -> 3.9 -> 2.6 -> 2.9 -> 2.7  Acute Kidney Injury on Chronic kidney disease, stage III, improving -Creatinine Trended up from 1.36 -> 2.02 -> 1.6 -> 1.83  -> 1.89 -Foley was placed in ICU and now removed  -IV Lasix Held as patient was on IVF. IVF now D/C'd; Will resume home po Lasix  -Continue to monitor BMP closely as patient will be diuresing and now that she received Contrast for CTA  Polysubstance/EtOH Abuse -UDS positive for Cocaine x2  -Cocaine Cessastion advised  -Placed on CIWA as patient started Hallucinating and has EtOH history  -C/w MVI, Folic Acid, Thiamine  Tobacco Abuse -Cessation discussed -Added Nicotine Patch   Hyperlipidemia -Continue Statin, Fenofibrate  GERD -Continue PPI  COPD -Stable, continue home medications with Spiriva, Flonase. Restarted Home Albuterol  -Has Albuterol 2.5 mg Neb q4hprn Wheezing and SOB and DuoNEB BID -Continue with Dulera 2 puff IH BID -C/w Nicotine Patch   -As Above  Anxiety/Depression -Continue Prozac  Hypomagnesemia -Mag Level this AM was 1.7 -Continue to Monitor and Replete as Necessary -Repeat Mag Level in AM   Right Ankle Swelling  -Foot Xray shows No fracture or malalignment is seen. The ankle mortise is symmetric. Soft tissue swelling medially. Large plantar calcaneal spur.Probable old cortical avulsion is a anterior to the talus -Recommended Elevating ankle along with ICE  Bilateral Leg Pain -Bilateral lower  extremities are negative for acute deep vein thrombosis. Bilateral lower extremity veins exhibit pulsatile flow, suggestive of possible elevated right sided heart pressure. There is no evidence of right Baker's cyst. There is evidence of left Baker's cyst -Ordered Voltaren Gel Topically   DVT prophylaxis: Enoxaparin 40 mg sq q24h Code Status: FULL CODE Family Communication: Son at bedside Disposition Plan: SNF when medically stable; Anticipate D/C in 24-48 hours  Consultants:   Cardiology  Neurosurgery  PCCM  Neurology    Procedures:  ECHOCARDIOGRAM Study Conclusions  - Left ventricle: The cavity size was normal. Wall thickness was   increased in a pattern of moderate LVH. Systolic function was   moderately to severely reduced. The estimated ejection fraction   was in the range of 30% to 35%. Diffuse hypokinesis. Features are   consistent with a pseudonormal left ventricular filling pattern,   with concomitant abnormal relaxation and increased filling   pressure (grade 2 diastolic dysfunction). - Mitral valve: There was moderate regurgitation directed   posteriorly. - Left atrium: The atrium was mildly dilated.  EEG Impression: This awake and drowsy EEG is mildly abnormal due to mild diffuse slowing.    Bilateral Lower Extremity Duplex Bilateral lower extremities are negative for acute deep vein thrombosis.  Bilateral lower extremity veins exhibit pulsatile flow, suggestive of possible elevated right sided heart pressure. There is no evidence of right Baker's cyst. There is evidence of left Baker's cyst  Clinical Correlation: The above finding is indicative of diffuse cerebral dysfunction, which is nonspecific and may be secondary to hypoxic, toxic-metabolic or other diffuse physiologic abnormality.  Clinical correlation advised   Antimicrobials:  Anti-infectives    Start     Dose/Rate Route Frequency Ordered Stop   06/24/17 0800  ceFEPIme (MAXIPIME) 2 g in dextrose 5  % 50 mL IVPB     2 g 100 mL/hr over 30 Minutes Intravenous Every 24 hours 06/24/17 0749       Subjective: Seen and examined at bedside this AM and family was at bedside. She was upset that she did not have her inhalers in between her breathing treatments. Also complained of significant leg pain to the touch. No nausea, vomiting. Denied CP.  Objective: Vitals:   06/29/17 1332 06/29/17 1700 06/29/17 2024 06/29/17 2139  BP:  (!) 135/95  (!) 148/98  Pulse: 97 (!) 105  (!) 104  Resp: 18 20  20   Temp:  97.9 F (36.6 C)  98 F (36.7 C)  TempSrc:  Oral  Oral  SpO2: 100% 100% 99% 100%  Weight:      Height:        Intake/Output Summary (Last 24 hours) at 06/29/17 2339 Last data filed at 06/29/17 2222  Gross per 24 hour  Intake              483 ml  Output                0 ml  Net              483 ml   Filed Weights   06/26/17 0500 06/28/17 0500 06/29/17 0454  Weight: 72.2 kg (159 lb 3.2 oz) 76.2 kg (167 lb 14.4 oz) 76.3 kg (168 lb 3.2 oz)   Examination: Physical Exam:  Constitutional: WN/WD obese AAF in NAD but appears agitated today Eyes: Sclerae anicteric. Conjuctivae non-injected ENMT: Grossly normal hearing. External Ears and nose appear normal Neck: Supple with no JVD Respiratory: Diminished to Auscultation Bilaterally; No wheezing/rales/rhonchi. Patient was not tachypenic or using any accessory muscles to breathe Cardiovascular: RRR; Has Right ankle swelling noted Abdomen: Soft, NT, ND. Bowel Sounds present GU: Deferred Musculoskeletal: No contractures; No cyanosis; Legs painful to palpation  Skin: Warm and Dry. No rashes or lesions appreciated on a limited skin eval Neurologic: CN2-12 grossly intact. No appreciable focal deficits  Psychiatric: Patient is agitated today. Histrionic female. Awake and alert  Data Reviewed: I have personally reviewed following labs and imaging studies  CBC:  Recent Labs Lab 06/24/17 0248 06/25/17 0248 06/26/17 1015 06/27/17 0619  06/28/17 0218 06/29/17 0540  WBC 18.0* 18.2* 13.3* 10.7* 7.1 5.4  NEUTROABS 15.4*  --  11.8* 9.0* 5.4 3.9  HGB 13.6 15.9* 13.2 11.7* 12.0 11.6*  HCT 42.7 49.4* 40.7 36.8 37.9 37.6  MCV 98.2 100.4* 97.4 96.6 97.7 97.9  PLT 213 211 212 190 193 419   Basic Metabolic Panel:  Recent Labs Lab 06/24/17 0248  06/25/17 1547 06/26/17 1015 06/27/17 0619 06/28/17 0218 06/29/17 0540  NA 140  < > 140 141 143 140 142  K 5.0  < > 4.5 4.1 4.0 4.3 4.2  CL 108  < > 108 108 111 110 111  CO2 23  < > 21* 23 23 23  22  GLUCOSE 191*  < > 315* 196* 102* 151* 138*  BUN 26*  < > 31* 27* 25* 26* 24*  CREATININE 1.89*  < > 2.02* 1.82* 1.60* 1.83* 1.89*  CALCIUM 8.5*  < > 8.1* 8.2* 8.1* 8.1* 8.5*  MG 1.8  --   --  1.6* 1.8 1.8 1.7  PHOS 3.0  --   --  2.9 3.0 2.5 2.5  < > = values in this interval not displayed. GFR: Estimated Creatinine Clearance: 32.2 mL/min (A) (by C-G formula based on SCr of 1.89 mg/dL (H)). Liver Function Tests:  Recent Labs Lab 06/24/17 0248 06/27/17 0619 06/28/17 0218 06/29/17 0540  AST 21 18 29 30   ALT 23 21 24 24   ALKPHOS 154* 132* 138* 125  BILITOT 1.3* 0.8 0.9 0.5  PROT 5.3* 4.6* 5.3* 5.2*  ALBUMIN 2.2* 1.7* 2.0* 2.0*   No results for input(s): LIPASE, AMYLASE in the last 168 hours.  Recent Labs Lab 06/24/17 2156  AMMONIA 63*   Coagulation Profile: No results for input(s): INR, PROTIME in the last 168 hours. Cardiac Enzymes:  Recent Labs Lab 06/23/17 0631  CKTOTAL 40  CKMB 5.2*   BNP (last 3 results) No results for input(s): PROBNP in the last 8760 hours. HbA1C: No results for input(s): HGBA1C in the last 72 hours. CBG:  Recent Labs Lab 06/28/17 2116 06/29/17 0630 06/29/17 1108 06/29/17 1618 06/29/17 2144  GLUCAP 267* 210* 156* 128* 119*   Lipid Profile: No results for input(s): CHOL, HDL, LDLCALC, TRIG, CHOLHDL, LDLDIRECT in the last 72 hours. Thyroid Function Tests: No results for input(s): TSH, T4TOTAL, FREET4, T3FREE, THYROIDAB in the  last 72 hours. Anemia Panel: No results for input(s): VITAMINB12, FOLATE, FERRITIN, TIBC, IRON, RETICCTPCT in the last 72 hours. Sepsis Labs:  Recent Labs Lab 06/24/17 0248  06/25/17 0248 06/25/17 0557 06/25/17 1045 06/26/17 1015 06/26/17 1413  PROCALCITON 0.48  --   --  0.73  --   --   --   LATICACIDVEN  --   < > 3.9*  --  2.6* 2.9* 2.7*  < > = values in this interval not displayed.  Recent Results (from the past 240 hour(s))  MRSA PCR Screening     Status: None   Collection Time: 06/23/17  8:23 PM  Result Value Ref Range Status   MRSA by PCR NEGATIVE NEGATIVE Final    Comment:        The GeneXpert MRSA Assay (FDA approved for NASAL specimens only), is one component of a comprehensive MRSA colonization surveillance program. It is not intended to diagnose MRSA infection nor to guide or monitor treatment for MRSA infections.     Radiology Studies: Dg Ankle Complete Right  Result Date: 06/28/2017 CLINICAL DATA:  Fall, broke right ankle EXAM: RIGHT ANKLE - COMPLETE 3+ VIEW COMPARISON:  11/11/2009 FINDINGS: No fracture or malalignment is seen. The ankle mortise is symmetric. Soft tissue swelling medially. Large plantar calcaneal spur. Probable old cortical avulsion is a anterior to the talus. IMPRESSION: 1. Soft tissue swelling. No definite acute osseous abnormality. Possible old cortical avulsion anterior surface of the talus Electronically Signed   By: Donavan Foil M.D.   On: 06/28/2017 14:00   Scheduled Meds: .  stroke: mapping our early stages of recovery book   Does not apply Once  . albuterol  2.5 mg Nebulization BID  . aspirin EC  81 mg Oral Daily  . clopidogrel  75 mg Oral Q breakfast  . cycloSPORINE  1 drop Both Eyes  BID  . diclofenac sodium  2 g Topical QID  . enoxaparin (LOVENOX) injection  30 mg Subcutaneous Q24H  . ezetimibe  10 mg Oral Daily  . fenofibrate  160 mg Oral Daily  . [START ON 06/30/2017] fluticasone  2 spray Each Nare Daily  . folic acid  1 mg  Oral Daily  . furosemide  20 mg Oral Daily  . guaiFENesin  1,200 mg Oral BID  . hydrocortisone valerate cream   Topical BID  . insulin aspart  0-5 Units Subcutaneous QHS  . insulin aspart  0-9 Units Subcutaneous TID WC  . lidocaine  1 patch Transdermal Q24H  . living well with diabetes book   Does not apply Once  . LORazepam  0-4 mg Intravenous Q12H  . mometasone-formoterol  2 puff Inhalation BID  . montelukast  10 mg Oral QHS  . multivitamin with minerals  1 tablet Oral Daily  . nicotine  7 mg Transdermal Daily  . pantoprazole  40 mg Oral Daily  . rosuvastatin  40 mg Oral Daily  . sodium chloride flush  3 mL Intravenous Q12H  . thiamine  100 mg Oral Daily   Or  . thiamine  100 mg Intravenous Daily  . tiotropium  18 mcg Inhalation Daily  . Vitamin D (Ergocalciferol)  50,000 Units Oral Weekly   Continuous Infusions: . ceFEPime (MAXIPIME) IV Stopped (06/29/17 1046)  . sodium chloride      LOS: 8 days    Kerney Elbe, DO [facility] Pager [phone]  If 7PM-7AM, please contact night-coverage [email or web address] Password [facility] [place] 06/29/2017, 11:39 PM

## 2017-06-29 NOTE — Care Management Note (Signed)
Case Management Note  Patient Details  Name: [name] MRN: [id number] Date of Birth: May 26, 1966  Subjective/Objective:                    Action/Plan: Plan is for Pt to discharge to SNF. CSW following. No further needs per CM.   Expected Discharge Date:  06/27/17               Expected Discharge Plan:  Skilled Nursing Facility  In-House Referral:  Clinical Social Work  Discharge planning Services  CM Consult  Post Acute Care Choice:    Choice offered to:     DME Arranged:    DME Agency:     HH Arranged:    [place] Agency:     Status of Service:  In process, will continue to follow  If discussed at Long Length of Stay Meetings, dates discussed:    Additional Comments:  Pollie Friar, RN 06/29/2017, 11:56 AM

## 2017-06-29 NOTE — Progress Notes (Addendum)
*  Preliminary Results* Bilateral lower extremity venous duplex completed. Bilateral lower extremities are negative for acute deep vein thrombosis. Bilateral lower extremity veins exhibit pulsatile flow, suggestive of possible elevated right sided heart pressure. There is no evidence of right Baker's cyst. There is evidence of left Baker's cyst.  06/29/2017 1:57 PM Maudry Mayhew, BS, RVT, RDCS, RDMS

## 2017-06-29 NOTE — Progress Notes (Signed)
OT Cancellation Note  Patient Details Name: [name] MRN: [id number] DOB: 15-Jun-1966   Cancelled Treatment:    Reason Eval/Treat Not Completed: Patient declined, no reason specified; Pt currently waiting for breathing treatment, declining OOB/EOB/ and bed level activity at this time. Will check back as schedule permits.   Lou Cal, OT Pager [phone] 06/29/2017   Raymondo Band 06/29/2017, 4:28 PM

## 2017-06-30 ENCOUNTER — Inpatient Hospital Stay (HOSPITAL_COMMUNITY): Payer: Medicare Other

## 2017-06-30 DIAGNOSIS — E119 Type 2 diabetes mellitus without complications: Secondary | ICD-10-CM | POA: Diagnosis not present

## 2017-06-30 DIAGNOSIS — Z794 Long term (current) use of insulin: Secondary | ICD-10-CM

## 2017-06-30 DIAGNOSIS — M48061 Spinal stenosis, lumbar region without neurogenic claudication: Secondary | ICD-10-CM | POA: Diagnosis not present

## 2017-06-30 DIAGNOSIS — L97419 Non-pressure chronic ulcer of right heel and midfoot with unspecified severity: Secondary | ICD-10-CM | POA: Diagnosis not present

## 2017-06-30 DIAGNOSIS — I509 Heart failure, unspecified: Secondary | ICD-10-CM | POA: Diagnosis not present

## 2017-06-30 DIAGNOSIS — R0602 Shortness of breath: Secondary | ICD-10-CM

## 2017-06-30 DIAGNOSIS — E1122 Type 2 diabetes mellitus with diabetic chronic kidney disease: Secondary | ICD-10-CM

## 2017-06-30 DIAGNOSIS — M24671 Ankylosis, right ankle: Secondary | ICD-10-CM | POA: Diagnosis not present

## 2017-06-30 DIAGNOSIS — F1410 Cocaine abuse, uncomplicated: Secondary | ICD-10-CM

## 2017-06-30 DIAGNOSIS — I70232 Atherosclerosis of native arteries of right leg with ulceration of calf: Secondary | ICD-10-CM | POA: Diagnosis not present

## 2017-06-30 DIAGNOSIS — G464 Cerebellar stroke syndrome: Secondary | ICD-10-CM | POA: Diagnosis not present

## 2017-06-30 DIAGNOSIS — I5040 Unspecified combined systolic (congestive) and diastolic (congestive) heart failure: Secondary | ICD-10-CM | POA: Diagnosis not present

## 2017-06-30 DIAGNOSIS — I70242 Atherosclerosis of native arteries of left leg with ulceration of calf: Secondary | ICD-10-CM | POA: Diagnosis not present

## 2017-06-30 DIAGNOSIS — E785 Hyperlipidemia, unspecified: Secondary | ICD-10-CM | POA: Diagnosis not present

## 2017-06-30 DIAGNOSIS — J441 Chronic obstructive pulmonary disease with (acute) exacerbation: Secondary | ICD-10-CM | POA: Diagnosis not present

## 2017-06-30 DIAGNOSIS — I6330 Cerebral infarction due to thrombosis of unspecified cerebral artery: Secondary | ICD-10-CM

## 2017-06-30 DIAGNOSIS — I5021 Acute systolic (congestive) heart failure: Secondary | ICD-10-CM

## 2017-06-30 DIAGNOSIS — R2689 Other abnormalities of gait and mobility: Secondary | ICD-10-CM | POA: Diagnosis not present

## 2017-06-30 DIAGNOSIS — E1165 Type 2 diabetes mellitus with hyperglycemia: Secondary | ICD-10-CM

## 2017-06-30 DIAGNOSIS — I5023 Acute on chronic systolic (congestive) heart failure: Secondary | ICD-10-CM | POA: Diagnosis not present

## 2017-06-30 DIAGNOSIS — J9601 Acute respiratory failure with hypoxia: Secondary | ICD-10-CM | POA: Diagnosis not present

## 2017-06-30 DIAGNOSIS — G834 Cauda equina syndrome: Secondary | ICD-10-CM | POA: Diagnosis not present

## 2017-06-30 DIAGNOSIS — R748 Abnormal levels of other serum enzymes: Secondary | ICD-10-CM | POA: Diagnosis not present

## 2017-06-30 DIAGNOSIS — I10 Essential (primary) hypertension: Secondary | ICD-10-CM | POA: Diagnosis not present

## 2017-06-30 DIAGNOSIS — S81809A Unspecified open wound, unspecified lower leg, initial encounter: Secondary | ICD-10-CM | POA: Diagnosis not present

## 2017-06-30 DIAGNOSIS — R52 Pain, unspecified: Secondary | ICD-10-CM

## 2017-06-30 DIAGNOSIS — K219 Gastro-esophageal reflux disease without esophagitis: Secondary | ICD-10-CM | POA: Diagnosis not present

## 2017-06-30 DIAGNOSIS — M48062 Spinal stenosis, lumbar region with neurogenic claudication: Secondary | ICD-10-CM | POA: Diagnosis not present

## 2017-06-30 LAB — CBC WITH DIFFERENTIAL/PLATELET
Basophils Absolute: 0 10*3/uL (ref 0.0–0.1)
Basophils Relative: 0 %
Eosinophils Absolute: 0 10*3/uL (ref 0.0–0.7)
Eosinophils Relative: 1 %
HCT: 38.5 % (ref 36.0–46.0)
Hemoglobin: 12.3 g/dL (ref 12.0–15.0)
Lymphocytes Relative: 24 %
Lymphs Abs: 1.2 10*3/uL (ref 0.7–4.0)
MCH: 31.3 pg (ref 26.0–34.0)
MCHC: 31.9 g/dL (ref 30.0–36.0)
MCV: 98 fL (ref 78.0–100.0)
Monocytes Absolute: 0.3 10*3/uL (ref 0.1–1.0)
Monocytes Relative: 7 %
Neutro Abs: 3.3 10*3/uL (ref 1.7–7.7)
Neutrophils Relative %: 68 %
Platelets: 187 10*3/uL (ref 150–400)
RBC: 3.93 MIL/uL (ref 3.87–5.11)
RDW: 15.3 % (ref 11.5–15.5)
WBC: 4.9 10*3/uL (ref 4.0–10.5)

## 2017-06-30 LAB — COMPREHENSIVE METABOLIC PANEL
ALT: 27 U/L (ref 14–54)
AST: 33 U/L (ref 15–41)
Albumin: 2 g/dL — ABNORMAL LOW (ref 3.5–5.0)
Alkaline Phosphatase: 108 U/L (ref 38–126)
Anion gap: 9 (ref 5–15)
BUN: 22 mg/dL — ABNORMAL HIGH (ref 6–20)
CO2: 21 mmol/L — ABNORMAL LOW (ref 22–32)
Calcium: 8.5 mg/dL — ABNORMAL LOW (ref 8.9–10.3)
Chloride: 111 mmol/L (ref 101–111)
Creatinine, Ser: 1.97 mg/dL — ABNORMAL HIGH (ref 0.44–1.00)
GFR calc Af Amer: 32 mL/min — ABNORMAL LOW (ref >60)
GFR calc non Af Amer: 28 mL/min — ABNORMAL LOW (ref >60)
Glucose, Bld: 121 mg/dL — ABNORMAL HIGH (ref 65–99)
Potassium: 4.1 mmol/L (ref 3.5–5.1)
Sodium: 141 mmol/L (ref 135–145)
Total Bilirubin: 0.9 mg/dL (ref 0.3–1.2)
Total Protein: 5.3 g/dL — ABNORMAL LOW (ref 6.5–8.1)

## 2017-06-30 LAB — GLUCOSE, CAPILLARY
Glucose-Capillary: 112 mg/dL — ABNORMAL HIGH (ref 65–99)
Glucose-Capillary: 141 mg/dL — ABNORMAL HIGH (ref 65–99)
Glucose-Capillary: 157 mg/dL — ABNORMAL HIGH (ref 65–99)

## 2017-06-30 LAB — CREATININE, SERUM
Creatinine, Ser: 2.02 mg/dL — ABNORMAL HIGH (ref 0.44–1.00)
GFR calc Af Amer: 31 mL/min — ABNORMAL LOW (ref >60)
GFR calc non Af Amer: 27 mL/min — ABNORMAL LOW (ref >60)

## 2017-06-30 LAB — MAGNESIUM: Magnesium: 1.7 mg/dL (ref 1.7–2.4)

## 2017-06-30 LAB — PHOSPHORUS: Phosphorus: 2.7 mg/dL (ref 2.5–4.6)

## 2017-06-30 MED ORDER — NICOTINE 7 MG/24HR TD PT24: 7.0000 mg | MEDICATED_PATCH | Freq: Every day | TRANSDERMAL | 0 refills | Status: DC

## 2017-06-30 MED ORDER — DICLOFENAC SODIUM 1 % TD GEL: 2.0000 g | Freq: Four times a day (QID) | TRANSDERMAL | 0 refills | Status: DC

## 2017-06-30 MED ORDER — MAGNESIUM SULFATE 2 GM/50ML IV SOLN
2.0000 g | Freq: Once | INTRAVENOUS | Status: AC
  Administered 2017-06-30: 2 g via INTRAVENOUS
  Filled 2017-06-30: qty 50

## 2017-06-30 MED ORDER — LORAZEPAM 0.5 MG PO TABS: 0.5000 mg | ORAL_TABLET | Freq: Four times a day (QID) | ORAL | Status: DC | PRN

## 2017-06-30 MED ORDER — HYDROCORTISONE VALERATE 0.2 % EX CREA: TOPICAL_CREAM | Freq: Two times a day (BID) | CUTANEOUS | 0 refills | Status: DC

## 2017-06-30 MED ORDER — FUROSEMIDE 10 MG/ML IJ SOLN
20.0000 mg | Freq: Once | INTRAMUSCULAR | Status: AC
  Administered 2017-06-30: 20 mg via INTRAVENOUS
  Filled 2017-06-30: qty 4

## 2017-06-30 MED ORDER — LIDOCAINE 5 % EX PTCH: 1.0000 | MEDICATED_PATCH | CUTANEOUS | 0 refills | Status: DC

## 2017-06-30 MED ORDER — THIAMINE HCL 100 MG PO TABS: 100.0000 mg | ORAL_TABLET | Freq: Every day | ORAL | 0 refills | Status: DC

## 2017-06-30 MED ORDER — ASPIRIN 81 MG PO TBEC: 81.0000 mg | DELAYED_RELEASE_TABLET | Freq: Every day | ORAL | 0 refills | Status: DC

## 2017-06-30 MED ORDER — FOLIC ACID 1 MG PO TABS: 1.0000 mg | ORAL_TABLET | Freq: Every day | ORAL | 0 refills | Status: DC

## 2017-06-30 MED ORDER — ADULT MULTIVITAMIN W/MINERALS CH: 1.0000 | ORAL_TABLET | Freq: Every day | ORAL | 0 refills | Status: DC

## 2017-06-30 MED ORDER — INSULIN ASPART 100 UNIT/ML ~~LOC~~ SOLN: 0.0000 [IU] | Freq: Three times a day (TID) | SUBCUTANEOUS | 11 refills | Status: DC

## 2017-06-30 NOTE — Progress Notes (Signed)
PT Cancellation Note  Patient Details Name: [name] MRN: [id number] DOB: [birth year]   Cancelled Treatment:    Reason Eval/Treat Not Completed: Patient declined, no reason specified.   06/30/2017  Donnella Sham, [place] [phone]  (pager)06/30/2017  Donnella Sham, PT [phone]  (pager)   [name] 06/30/2017, 2:22 PM

## 2017-06-30 NOTE — Clinical Social Work Placement (Signed)
   CLINICAL SOCIAL WORK PLACEMENT  NOTE  Date:  06/30/2017  Patient Details  Name: [name] MRN: [id number] Date of Birth: 1966-04-22  Clinical Social Work is seeking post-discharge placement for this patient at the [place] level of care (*CSW will initial, date and re-position this form in  chart as items are completed):  Yes   Patient/family provided with [facility] Work Department's list of facilities offering this level of care within the geographic area requested by the patient (or if unable, by the patient's family).  Yes   Patient/family informed of their freedom to choose among providers that offer the needed level of care, that participate in Medicare, Medicaid or managed care program needed by the patient, have an available bed and are willing to accept the patient.  Yes   Patient/family informed of [facility]'s ownership interest in [facility] and [facility], as well as of the fact that they are under no obligation to receive care at these facilities.  PASRR submitted to EDS on       PASRR number received on       Existing PASRR number confirmed on       FL2 transmitted to all facilities in geographic area requested by pt/family on       FL2 transmitted to all facilities within larger geographic area on 06/28/17     Patient informed that his/her managed care company has contracts with or will negotiate with certain facilities, including the following:        Yes   Patient/family informed of bed offers received.  Patient chooses bed at [facility]     Physician recommends and patient chooses bed at      Patient to be transferred to [facility] on 06/30/17.  Patient to be transferred to facility by [facility]     Patient family notified on 06/30/17 of transfer.  Name of family member notified:  Danton Sewer     PHYSICIAN       Additional Comment:     _______________________________________________ Geralynn Ochs, LCSW 06/30/2017, 3:53 PM

## 2017-06-30 NOTE — Progress Notes (Signed)
  Speech Language Pathology Treatment: Cognitive-Linquistic  Patient Details Name: [name] MRN: [id number] DOB: 1966-04-13 Today's Date: 06/30/2017 Time: 9379-0240 SLP Time Calculation (min) (ACUTE ONLY): 10 min  Assessment / Plan / Recommendation Clinical Impression  Skilled treatment focused on cognition goals. SLP facilitated session by providing supervision to answer orientation questions, solve simple problem solving task related to call bell and pt with no cues to increase speech intelligibility to 100% at simple conversation level. Education provided to nursing current session. Pt handed off to NT d/t pt requested to use the bathroom.    HPI HPI: [name] an 51 y.o.female with a history of ETOH abuse, CKD, COPD, DM2, hypertension, hyperlipidemia, hypertensive cardiomyopathy, obesity, tobacco abuse, and poor medication compliance who was initially admitted for back pain, and then transferred to the ICU after having periods of unresponsiveness on the floor. Much of the history is obtained from the nurse as the patient is unwilling to talk. Apparently she was very vocal this morning and talking on the phone with family members and then suddenly became somewhat hypotensive and since then has not been localizing or following commands. MRI of brain Multifocal subcentimeter acute ischemic infarcts involving the bilateral cerebral hemispheres. Findings are most likely embolic in nature. Remote infarcts involving the left basal ganglia, left thalamus, and bilateral cerebellar hemispheres      SLP Plan  Continue with current plan of care       Recommendations                   Follow up Recommendations: Skilled Nursing facility SLP Visit Diagnosis: Dysarthria and anarthria (R47.1);Cognitive communication deficit (R41.841) Plan: Continue with current plan of care       [place] 06/30/2017, 2:34 PM

## 2017-06-30 NOTE — Progress Notes (Signed)
Patient is having anxiety attack she has had multiple 6 -8 beat runs of v- tach she has nothing PRN for anxiety will contact attending to inform them of situation.

## 2017-06-30 NOTE — Progress Notes (Signed)
Pharmacy Antibiotic Note  [name] is a 51 y.o. female admitted on 06/21/2017 with leukocytosis - PNA in right lung vs Cocaine use. No effusion or pneumothorax. Pharmacy has been consulted for cefepime dosing. Patient missed antibiotics on 2nd day of therapy 2/2 lost IV access. WBC WNL, remains afebrile  Plan: Continue cefepime 2 grams IV every 24 hours Continue to monitor clinical progress and BMP  Height: 5\' 1"  (154.9 cm) Weight: 174 lb 4.8 oz (79.1 kg) IBW/kg (Calculated) : 47.8  Temp (24hrs), Avg:98.2 F (36.8 C), Min:97.9 F (36.6 C), Max:98.6 F (37 C)   Recent Labs Lab 06/25/17 0043 06/25/17 0248  06/25/17 1045  06/26/17 1015 06/26/17 1413 06/27/17 0619 06/28/17 0218 06/29/17 0540 06/30/17 0540  WBC  --  18.2*  --   --   --  13.3*  --  10.7* 7.1 5.4 4.9  CREATININE  --   --   < >  --   < > 1.82*  --  1.60* 1.83* 1.89* 1.97*  LATICACIDVEN 3.1* 3.9*  --  2.6*  --  2.9* 2.7*  --   --   --   --   < > = values in this interval not displayed.  Estimated Creatinine Clearance: 31.4 mL/min (A) (by C-G formula based on SCr of 1.97 mg/dL (H)).    No Known Allergies  Antimicrobials this admission: Cefepime 8/30 >>   Microbiology results: MRSA PCR 8/29 > Neg  Thank you for allowing pharmacy to be a part of this patient's care.  Georga Bora, PharmD Clinical Pharmacist 06/30/2017 10:45 AM

## 2017-06-30 NOTE — Progress Notes (Signed)
Pt discharged per orders from MD. Pt and family educated and aware of discharge and transfer to SNF. Pt's IV was removed prior to discharge. All questions and concerns were addressed. Pt exited hospital via stretcher. RN called and gave report to Janett Billow, Therapist, sports.

## 2017-06-30 NOTE — Discharge Summary (Signed)
Physician Discharge Summary  [name] NWG:[id number] DOB: [birth year] DOA: 06/21/2017  PCP: [name], MD  Admit date: 06/21/2017 Discharge date: 06/30/2017  Time spent: 45 minutes  Recommendations for Outpatient Follow-up:  Patient will be discharged to skilled nursing facility. Continue physical and occupational therapy.  Patient will need to follow up with primary care provider within one week of discharge, Repeat CBC, CMP, magnesium. Follow-up with Dr. Cyndy Freeze, neurosurgery, in 2-3 weeks. Follow up with neurology. Patient should continue medications as prescribed.  Patient should follow a Heart healthy/carb modified diet.   Discharge Diagnoses:  Hypoxia Episode with Confusion/AMS and Hypotension and periods of Apnea  Acute CVA's likley due to Hypotension Elevated Troponin Acute systolic CHF exacerbation Back pain with spinal stenosis of the lumbar region Diabetes mellitus, type II, complicated by hyperglycemia Hypokalemia, improved Leukocytosis Lactic Acidosis Acute Kidney Injury on Chronic kidney disease, stage III, improving Polysubstance/EtOH Abuse Tobacco Abuse Hyperlipidemia GERD COPD Anxiety/Depression Hypomagnesemia Right Ankle Swelling  Bilateral Leg Pain  Discharge Condition: Stable  Diet recommendation: Heart healthy/Carbo modified  Filed Weights   06/28/17 0500 06/29/17 0454 06/30/17 0500  Weight: 76.2 kg (167 lb 14.4 oz) 76.3 kg (168 lb 3.2 oz) 79.1 kg (174 lb 4.8 oz)    History of present illness:  On 06/21/17 by Dr. Corey Harold is a 51 y.o. female with medical history significant of dilated cardiomyopathy with ejection fraction of 20-25%, diabetes mellitus type 2 uncontrolled with chronic kidney disease stage III, uncontrolled hypertension, cocaine abuse, tobacco abuse, chronic obstructive pulmonary disease, hyperlipidemia, and medical noncompliance who presents the emergency department stating that she can no longer walk  her legs won't hold her up.  Patient states that she has not been taking any of her medications for her heart in 2 months. She stopped her diabetes medications 2 weeks ago. She lives by herself in a motel. Home health was coming to help her until the home health company went out of business. Since that time her sinus bedtime to help take care of her. She is usually able to walk over the last couple of days she's become increasingly weak late last night or very early this morning she was unable to get up off the commode because of bilateral leg weakness. She slid to the ground and crawled out to the main area of the motel room where she remained in a crosslegged sitting pattern until family came approximately 12 hours later. She didn't call 911 because she did not want to come to the hospital.  Ultimately when family found her and they convinced her come into the emergency department. She has complaints of increased swelling in her legs (not taking her Lasix) and shortness of breath. She feels generally ill which has been progressive and now become severe. She denies any fevers. She has had poor urine output recently not making much urine at all. She has increased thirst. She has no chest pain she denies palpitations. She says she has serious chronic low back pain and this has not worsened but the weakness is severe. Also for past few days she has been having intermittent myoclonic jerking which lasted about 30 seconds and resolves. She has no other shaking. He has made her symptoms better nothing has made them worse. Symptoms have been chronic and worsening since they developed.  Hospital Course:  Hypoxia Episode with Confusion/AMS and Hypotension and periods of Apnea  -Resolved -Suspect be secondary to substance abuse  -of note, patient was cocaine and  THC positive on her UDS -Patient desaturated to 79% on 6 L and was noted to be confused with hypotension on 8/29 -Patient may need outpatient sleep study,  she is not on home oxygen or BiPAP -CT had done on 06/23/2017 showed no acute intracranial abnormality -CTA chest negative for PE showed patchy infiltrates in the right lung, question pneumonia versus edema -Neurology consulted and appreciated -MRI showed embolic CVA -EEG awake and drowsy EEG is mildly abnormal due to mild diffuse slowing -Antihypertensives were held -Patient was also given Narcan and placed on CIWA -Patient currently on 1 L nasal cannula for comfort. Oxygen saturation have been 100% on room air  Acute CVA's likley due to Hypotension -Cannot r/o Cardioembolic Infarcts -MRI of Head Showed Multifocal subcentimeter acute ischemic infarcts involving the bilateral cerebral hemispheres as above. Findings are most likely embolic in nature. No associated hemorrhage or mass effect. Moderate chronic microvascular ischemic disease with additional remote infarcts involving the left basal ganglia, left thalamus, and bilateral cerebellar hemispheres. -MRA of the Head showed Negative intracranial MRA for large vessel occlusion. Moderate atherosclerotic disease involving the anterior posterior circulation as above, most notable at the distal right M1 segment -Echocardiogram showed EF 25-42%, grade 2 diastolic dysfunction -Carotids showed No significant (1-39%) ICA stenosis. Antegrade vertebral flow.  -Lipid Panel: total Cholesterol 164, HDL 51, LDL 96, TG 87  -HbA1c was >15.5  -Neurology recommending DAPT for 3 months and then Plavix alone for after -PT and OT recommended SNF -Cardiology does not feel patient is candidate for implantable loop recorder at this time recommended considering 30 day event monitor time of discharge and to resume bisoprolol when able   Elevated Troponin -Likely demand ischemia in the Setting of Cocaine Use -Troponin 0.32, 0.46, 0.41 (relatively flat) -Denied chest pain -Cardiology consulted and appreciated -Echocardiogram as above -Patient had heart cath  07/28/2016 which showed no apparent coronary artery disease, continue aggressive medical therapy and both pressure control -Continue aspirin, Plavix  Acute systolic CHF exacerbation -BNP on admission 1152 -CXR on Admission showed mild pulmonary vascular congestion -Last echocardiogram 05/26/2016 showed an EF of 20-25%, restrictive physiology, decreased left ventricular diastolic compliance and or increased left atrial pressure -BP echocardiogram showed EF of 30-35%, improvement -Patient was placed on IV Lasix however did become hypotensive -Cardiology consulted and appreciated, recommended restarting bisoprolol as well as ARB as blood pressure allows -Continue Lasix 20 mg by mouth daily -Patient currently appears to be improved and euvolemic  Back pain with spinal stenosis of the lumbar region -MRI showed L3-L4 canal stenosis of disorganized cauda equina -Neurosurgery consultation appreciated and originally patient was supposed to go for Lumbar Discectomy/Decompression for Herniated Disc at Levels L3/4 but unclear whether she will go or not; Cardiology cleared patient -Discussed Case with Dr. Ellene Route and patient unlikely to undergo surgical procedure -Continue pain control -Dr. Christella Noa evaluated and recommending no Surgery at this time and will re-evaluate after SNF  Diabetes mellitus, type II, complicated by hyperglycemia -On arrival to emergency department, blood sugars over 600 -HbA1c >15.5 -Patient uses Prandin and Tradjenta at home, resume upon discharge -Patient will need follow up with primary care physician regarding better control of her diabetes  Hypokalemia, improved -Resolved  Leukocytosis -Resolved -? Pneumonia in Right Lung vs Cocaine Use -CXR showed Stable cardiomegaly without edema, significant collapse or consolidation by plain radiography. No effusion or pneumothorax. Trachea is midline. Aorta is atherosclerotic and ectatic. No significant interval change. Stable  mild hyperinflation -Patient completed 6 days of IV antibiotics  Lactic AcidoImproved  Acute Kidney Injury on Chronic kidney disease, stage III -Patient did have mild bump in her creatinine up to 1.89 however GFR continues to be in stage III -Repeat BMP in 1 week  Polysubstance/EtOH Abuse -UDS positive for Cocaine x2  -Cocaine Cessastion advised  -Placed on CIWA as patient started Hallucinating and has EtOH history  -C/w MVI, Folic Acid, Thiamine  Tobacco Abuse -Cessation discussed, continue nicotine patch   Hyperlipidemia -Continue Statin, Fenofibrate  GERD -Continue PPI  COPD -Stable, continue home medications with Spiriva, Flonase. Restarted Home Albuterol  -C/w Nicotine Patch    Anxiety/Depression -Continue Prozac  Hypomagnesemia -Mag Level this AM was 1.7 -Repeat in one week   Right Ankle Swelling  -Foot Xray shows No fracture or malalignment is seen. The ankle mortise is symmetric. Soft tissue swelling medially. Large plantar calcaneal spur.Probable old cortical avulsion is a anterior to the talus -Recommended Elevating ankle along with ICE  Bilateral Leg Pain -Bilateral lower extremities are negativefor acute deep vein thrombosis. Bilateral lower extremity veins exhibit pulsatile flow, suggestive of possible elevated right sided heart pressure. There is no evidence of right Baker's cyst. There is evidence of left Baker's cyst -Ordered Voltaren Gel Topically   Procedures: Echocardiogram EEG Bilateral lower extremity doppler  Consultations: Cardiology Neurosurgery  PCCM Neurology  Discharge Exam: Vitals:   06/30/17 0959 06/30/17 1421  BP: (!) 154/115 (!) 155/106  Pulse: 94 73  Resp: 18 18  Temp: 98.6 F (37 C) 97.9 F (36.6 C)  SpO2: 100% 100%   Continues to complain of back and leg pain. Would like her Voltaren gel. Denies chest pain, shortness of breath, abdominal pain, nausea or vomiting, diarrhea or constipation.    General:  Well developed, well nourished, NAD, appears stated age  HEENT: NCAT, mucous membranes moist.  Cardiovascular: S1 S2 auscultated, no rubs, murmurs or gallops. Regular rate and rhythm.  Respiratory: Clear to auscultation bilaterally with equal chest rise  Abdomen: Soft, nontender, nondistended, + bowel sounds  Extremities: warm dry without cyanosis clubbing or edema  Neuro: AAOx3, nonfocal  Psych:histrionic  Discharge Instructions Discharge Instructions    Discharge instructions    Complete by:  As directed    Patient will be discharged to skilled nursing facility. Continue physical and occupational therapy.  Patient will need to follow up with primary care provider within one week of discharge, Repeat CBC, CMP, magnesium. Follow-up with Dr. Cyndy Freeze, neurosurgery, in 2-3 weeks. Patient should continue medications as prescribed.  Patient should follow a Heart healthy/carb modified diet.     Current Discharge Medication List    START taking these medications   Details  aspirin EC 81 MG EC tablet Take 1 tablet (81 mg total) by mouth daily. Qty: 30 tablet, Refills: 0    diclofenac sodium (VOLTAREN) 1 % GEL Apply 2 g topically 4 (four) times daily. Qty: 100 g, Refills: 0    folic acid (FOLVITE) 1 MG tablet Take 1 tablet (1 mg total) by mouth daily. Qty: 30 tablet, Refills: 0    hydrocortisone valerate cream (WESTCORT) 0.2 % Apply topically 2 (two) times daily. Qty: 45 g, Refills: 0    insulin aspart (NOVOLOG) 100 UNIT/ML injection Inject 0-9 Units into the skin 3 (three) times daily with meals. Qty: 10 mL, Refills: 11    lidocaine (LIDODERM) 5 % Place 1 patch onto the skin daily. Remove & Discard patch within 12 hours or as directed by MD Qty: 30 patch, Refills: 0    Multiple Vitamin (  MULTIVITAMIN WITH MINERALS) TABS tablet Take 1 tablet by mouth daily. Qty: 30 tablet, Refills: 0    nicotine (NICODERM CQ - DOSED IN MG/24 HR) 7 mg/24hr patch Place 1 patch (7 mg total) onto the  skin daily. Qty: 28 patch, Refills: 0    thiamine 100 MG tablet Take 1 tablet (100 mg total) by mouth daily. Qty: 30 tablet, Refills: 0      CONTINUE these medications which have NOT CHANGED   Details  amLODipine (NORVASC) 10 MG tablet TAKE 1 TABLET BY MOUTH ONCE A DAY. Qty: 90 tablet, Refills: 1    bisoprolol (ZEBETA) 5 MG tablet Take 1 tablet (5 mg total) by mouth daily. Qty: 90 tablet, Refills: 0    Choline Fenofibrate 135 MG capsule TAKE 1 CAPSULE BY MOUTH EVERY DAY. Qty: 30 capsule, Refills: 0    cloNIDine (CATAPRES) 0.2 MG tablet TAKE (1) TABLET BY MOUTH AT BEDTIME FOR BLOOD PRESSURE. Qty: 90 tablet, Refills: 1    clopidogrel (PLAVIX) 75 MG tablet TAKE 1 TABLET BY MOUTH DAILY WITH BREAKFAST. Qty: 30 tablet, Refills: 0    ezetimibe (ZETIA) 10 MG tablet Take 1 tablet (10 mg total) by mouth daily. Qty: 90 tablet, Refills: 1    FLUoxetine (PROZAC) 20 MG capsule TAKE 1 CAPSULE BY MOUTH EVERY DAY. Qty: 30 capsule, Refills: 0    gabapentin (NEURONTIN) 100 MG capsule TAKE 1 CAPSULE BY MOUTH THREE TIMES A DAY. Qty: 90 capsule, Refills: 0    montelukast (SINGULAIR) 10 MG tablet TAKE (1) TABLET BY MOUTH AT BEDTIME. Qty: 30 tablet, Refills: 0    pantoprazole (PROTONIX) 40 MG tablet Take 1 tablet (40 mg total) by mouth daily. Qty: 90 tablet, Refills: 1    potassium chloride SA (K-DUR,KLOR-CON) 20 MEQ tablet TAKE 1&1/2 TABLET BY MOUTH DAILY. Qty: 45 tablet, Refills: 0    PROAIR HFA 108 (90 Base) MCG/ACT inhaler INHALE 2 PUFFS EVERY 6 HOURS AS NEEDED FOR SHORTNESS OF BREATH/WHEEZING. Qty: 8.5 g, Refills: 0    repaglinide (PRANDIN) 1 MG tablet TAKE 1 TABLET BY MOUTH 3 TIMES DAILY BEFORE MEALS. Qty: 270 tablet, Refills: 1   Associated Diagnoses: Diabetes mellitus type 2 in obese (HCC)    rosuvastatin (CRESTOR) 40 MG tablet TAKE 1 TABLET BY MOUTH ONCE A DAY. Qty: 30 tablet, Refills: 0    SPIRIVA HANDIHALER 18 MCG inhalation capsule Place 18 mcg into inhaler and inhale daily.       TRADJENTA 5 MG TABS tablet TAKE 1 TABLET BY MOUTH ONCE A DAY. Qty: 30 tablet, Refills: 0    traZODone (DESYREL) 50 MG tablet TAKE (1) TABLET BY MOUTH AT BEDTIME. Qty: 30 tablet, Refills: 0    Vitamin D, Ergocalciferol, (DRISDOL) 50000 units CAPS capsule Take 1 capsule (50,000 Units total) by mouth once a week. Qty: 12 capsule, Refills: 1    azelastine (ASTELIN) 0.1 % nasal spray Place 2 sprays into both nostrils 2 (two) times daily. Use in each nostril as directed Qty: 30 mL, Refills: 5    fluticasone (FLONASE) 50 MCG/ACT nasal spray Place 2 sprays into both nostrils daily. Qty: 48 g, Refills: 1    furosemide (LASIX) 20 MG tablet TAKE 1 TABLET BY MOUTH DAILY. Qty: 30 tablet, Refills: 0    ipratropium-albuterol (DUONEB) 0.5-2.5 (3) MG/3ML SOLN Take 3 mLs by nebulization every 6 (six) hours as needed. Qty: 360 mL, Refills: 1      STOP taking these medications     benazepril (LOTENSIN) 40 MG tablet  hydrOXYzine (ATARAX/VISTARIL) 50 MG tablet      spironolactone (ALDACTONE) 25 MG tablet      budesonide-formoterol (SYMBICORT) 160-4.5 MCG/ACT inhaler      cycloSPORINE (RESTASIS) 0.05 % ophthalmic emulsion        No Known Allergies Follow-up Information    Dennie Bible, NP. Schedule an appointment as soon as possible for a visit in 6 week(s).   Specialty:  Family Medicine Contact information: [address] [place] [id number] [phone]        [name], MD. Schedule an appointment as soon as possible for a visit in 1 week(s).   Specialty:  Family Medicine Why:  Hospital follow up Contact information: [address] [place] [address] [phone]        Ashok Pall, MD. Schedule an appointment as soon as possible for a visit in 3 week(s).   Specialty:  Neurosurgery Why:  Hospital follow up Contact information: [address] [place] [address] [phone]            The  results of significant diagnostics from this hospitalization (including imaging, microbiology, ancillary and laboratory) are listed below for reference.    Significant Diagnostic Studies: Dg Chest 1 View  Result Date: 06/24/2017 CLINICAL DATA:  Shortness of breath, acute respiratory failure with hypoxia, chronic renal insufficiency, cardiomyopathy, COPD, current smoker. EXAM: CHEST 1 VIEW COMPARISON:  CT scan of the chest and chest x-ray of June 23, 2017 FINDINGS: The lungs are mildly hyperinflated despite the apical lordotic positioning. Hazy increased density in the right perihilar region persists. The retrocardiac region on the left exhibits increased density. There is no pleural effusion or pneumothorax. The cardiac silhouette is enlarged. The pulmonary vascularity is not engorged. The bony thorax is unremarkable. IMPRESSION: Hyperinflation consistent with COPD. Right perihilar and left retrocardiac interstitial density compatible with atelectasis or pneumonia. Cardiomegaly without significant pulmonary vascular congestion or pulmonary edema. No pleural effusion. Electronically Signed   By: David  Martinique M.D.   On: 06/24/2017 07:48   Dg Lumbar Spine Complete  Result Date: 06/21/2017 CLINICAL DATA:  Acute low back pain for 3 days. EXAM: LUMBAR SPINE - COMPLETE 4+ VIEW COMPARISON:  05/09/2015 and prior studies FINDINGS: No acute fracture noted. Increased severe disc space loss at L3-4 noted with 6 mm anterolisthesis of L3 on L4, new since 05/09/2015. Moderate to severe degenerative disc disease and spondylosis at L4-5 and L5-S1 again identified. Mild multilevel degenerative disc disease otherwise noted. No suspicious focal bony lesion or spondylolysis identified. IMPRESSION: 1. Increased severe degenerative disc disease at L3-4 with new grade 1 anterolisthesis at this level. 2. Moderate to severe degenerative changes at L4-5 and L5-S1 again identified. Electronically Signed   By: Margarette Canada M.D.   On:  06/21/2017 17:22   Dg Ankle Complete Right  Result Date: 06/28/2017 CLINICAL DATA:  Fall, broke right ankle EXAM: RIGHT ANKLE - COMPLETE 3+ VIEW COMPARISON:  11/11/2009 FINDINGS: No fracture or malalignment is seen. The ankle mortise is symmetric. Soft tissue swelling medially. Large plantar calcaneal spur. Probable old cortical avulsion is a anterior to the talus. IMPRESSION: 1. Soft tissue swelling. No definite acute osseous abnormality. Possible old cortical avulsion anterior surface of the talus Electronically Signed   By: Donavan Foil M.D.   On: 06/28/2017 14:00   Ct Head Wo Contrast  Result Date: 06/23/2017 CLINICAL DATA:  51 y/o F; left lower extremity weakness beginning yesterday. EXAM: CT HEAD WITHOUT CONTRAST  TECHNIQUE: Contiguous axial images were obtained from the base of the skull through the vertex without intravenous contrast. COMPARISON:  06/22/2017 CT of the head. FINDINGS: Brain: No evidence of acute infarction, hemorrhage, hydrocephalus, extra-axial collection or mass lesion/mass effect. Stable chronic lacunar infarcts within the left thalamus and left caudate head. Stable mild chronic microvascular ischemic changes of white matter parenchymal volume loss of the brain. Vascular: Mild calcific atherosclerosis of carotid siphons. No hyperdense vessel identified. Skull: Normal. Negative for fracture or focal lesion. Sinuses/Orbits: Chronic left lamina papyracea see a fracture deformity with herniation of extraconal fat. Opacification of left sphenoid sinus. Otherwise negative. Other: None. IMPRESSION: 1. No acute intracranial abnormality identified. 2. Mild chronic microvascular ischemic changes and mild parenchymal volume loss of the brain. Small chronic lacunar infarcts in left thalamus and left caudate head. Electronically Signed   By: Kristine Garbe M.D.   On: 06/23/2017 19:20   Ct Head Wo Contrast  Result Date: 06/22/2017 CLINICAL DATA:  Left lower extremity weakness  beginning yesterday. EXAM: CT HEAD WITHOUT CONTRAST TECHNIQUE: Contiguous axial images were obtained from the base of the skull through the vertex without intravenous contrast. COMPARISON:  04/03/2015 FINDINGS: Brain: Premature chronic small vessel ischemia with remote lacunar infarcts in the bilateral thalamus and left caudate head. Small remote bilateral cerebellar infarcts. Ischemic gliosis in the cerebral white matter. No evidence of acute infarct, hemorrhage, hydrocephalus, or masslike findings. Vascular: Vertebrobasilar tortuosity. Atherosclerotic calcification. No hyperdense vessel. Skull: No acute or aggressive finding Sinuses/Orbits: Remote blowout fracture of the medial wall left orbit. IMPRESSION: 1. No acute finding. 2. Premature chronic small vessel ischemia with remote lacunar infarcts, as described. Electronically Signed   By: Monte Fantasia M.D.   On: 06/22/2017 07:22   Ct Angio Chest Pe W Or Wo Contrast  Result Date: 06/23/2017 CLINICAL DATA:  Dyspnea, question pulmonary embolism, history type II diabetes mellitus, smoking, COPD, essential hypertension, chronic kidney disease, hypertensive cardiomyopathy EXAM: CT ANGIOGRAPHY CHEST WITH CONTRAST TECHNIQUE: Multidetector CT imaging of the chest was performed using the standard protocol during bolus administration of intravenous contrast. Multiplanar CT image reconstructions and MIPs were obtained to evaluate the vascular anatomy. CONTRAST:  80 cc Isovue 370 IV COMPARISON:  CT chest 11/08/2012 FINDINGS: Cardiovascular: Motion artifacts limit assessment of the ascending aorta. Ascending aorta appears upper normal caliber without aneurysm. Suboptimal assessment for dissection due to lack of adequate aortic enhancement. Pulmonary arteries well opacified with mild dilatation of the main pulmonary artery 3.5 cm diameter. No evidence of pulmonary embolism. No pericardial effusion. Mediastinum/Nodes: Esophagus unremarkable. Base of cervical region normal  appearance. No thoracic adenopathy. Lungs/Pleura: Patchy infiltrates throughout RIGHT upper and RIGHT lower lobes, minimally in RIGHT middle lobe question pneumonia. Subsegmental atelectasis in the anterior portion of the LEFT lower lobe adjacent to the basilar portion of the major fissure. No pleural effusion or pneumothorax. Upper Abdomen: BILATERAL adrenal gland thickening without discrete nodule. Remaining visualized upper abdomen unremarkable. Musculoskeletal: No acute osseous findings. Review of the MIP images confirms the above findings. IMPRESSION: No evidence of pulmonary embolism. Patchy infiltrates in RIGHT lung question pneumonia versus asymmetric edema. Subsegmental atelectasis LEFT lower lobe. Enlargement of cardiac chambers with upper normal caliber of the ascending thoracic aorta. Electronically Signed   By: Lavonia Dana M.D.   On: 06/23/2017 19:12   Mr Jodene Nam Head Wo Contrast  Result Date: 06/24/2017 CLINICAL DATA:  Initial evaluation for acute unresponsiveness, stroke follow-up. EXAM: MRI HEAD WITHOUT CONTRAST MRA HEAD WITHOUT CONTRAST TECHNIQUE: Multiplanar, multiecho pulse sequences  of the brain and surrounding structures were obtained without intravenous contrast. Angiographic images of the head were obtained using MRA technique without contrast. COMPARISON:  Prior CT from 06/23/2017. FINDINGS: MRI HEAD FINDINGS Brain: Diffuse prominence of the CSF containing spaces compatible with generalized cerebral atrophy. Patchy and confluent T2/FLAIR hyperintensity within the periventricular and deep white matter both cerebral hemispheres most compatible chronic small vessel ischemic disease, moderate nature. Remote lacunar infarcts present within the left thalamus and left basal ganglia. Scattered small remote bilateral cerebellar infarcts present. There are scattered subcentimeter foci of diffusion abnormality involving the bilateral cerebral hemispheres, consistent with small acute ischemic infarcts.  These predominantly involve the deep white matter of both cerebral hemispheres, although there is patchy involvement within the deep gray nuclei as well. Reference purposes, largest focus on the right is positioned within the posterior right centrum semi ovale and measures 6 mm (series 3, image 37). Largest focus on the left is positioned within the lateral left thalamus and measures 7 mm (series 3, image 28). No associated mass effect or hemorrhage. Sparing of the brainstem and cerebellum noted. Few punctate foci susceptibility fact noted within the left thalamus, consistent with tiny chronic micro hemorrhages, likely hypertensive in nature. No other evidence for acute or chronic intracranial hemorrhage. No mass lesion, midline shift or mass effect. No hydrocephalus. No extra-axial fluid collection. Major dural sinuses are grossly patent. Incidental note made of a partially empty sella. Suprasellar region normal. Vascular: Major intracranial vascular flow voids are maintained. Skull and upper cervical spine: Craniocervical junction within normal limits. Visualized upper cervical spine unremarkable. Bone marrow signal intensity within normal limits. No scalp soft tissue abnormality. Sinuses/Orbits: Globes and orbital soft tissues within normal limits. Remote posttraumatic defect noted at the left lamina papyracea. Paranasal sinuses are clear. Small left mastoid effusion noted, of doubtful significance. Inner ear structures grossly normal. MRA HEAD FINDINGS ANTERIOR CIRCULATION: Study degraded by motion artifact. Distal cervical segments of the internal carotid artery is are patent with antegrade flow. Petrous segments patent bilaterally without flow-limiting stenosis. Atheromatous irregularity within the cavernous ICAs without high-grade stenosis. ICA termini widely patent. A1 segments patent. Anterior communicating artery normal. Anterior cerebral arteries demonstrate multifocal atheromatous irregularity but are  patent to their distal aspects. Atheromatous irregularity within the left M1 segment without flow-limiting stenosis. Smooth tapering of the distal right MCA with moderate to severe narrowing. MCA bifurcations within normal limits. Proximal M2 vessels not well evaluated due to motion, although did do not appear occluded. Atheromatous irregularity within the distal MCA branches bilaterally. POSTERIOR CIRCULATION: Vertebral arteries patent to the vertebrobasilar junction without flow-limiting stenosis. Left vertebral artery slightly dominant. Posterior inferior cerebral artery is not visualized on this exam. Basilar artery tortuous but widely patent to its distal aspect without flow-limiting stenosis. Superior cerebral arteries patent proximally. Both of the posterior cerebral artery supplied via the basilar and are patent to their distal aspects. Multifocal atheromatous irregularity within the PCAs bilaterally without flow-limiting stenosis. No aneurysm or vascular malformation. IMPRESSION: MRI HEAD IMPRESSION: 1. Multifocal subcentimeter acute ischemic infarcts involving the bilateral cerebral hemispheres as above. Findings are most likely embolic in nature. No associated hemorrhage or mass effect. 2. Moderate chronic microvascular ischemic disease with additional remote infarcts involving the left basal ganglia, left thalamus, and bilateral cerebellar hemispheres. MRA HEAD IMPRESSION: 1. Negative intracranial MRA for large vessel occlusion. 2. Moderate atherosclerotic disease involving the anterior posterior circulation as above, most notable at the distal right M1 segment. Electronically Signed   By: Marland Kitchen  Jeannine Boga M.D.   On: 06/24/2017 20:32   Mr Brain Wo Contrast  Result Date: 06/24/2017 CLINICAL DATA:  Initial evaluation for acute unresponsiveness, stroke follow-up. EXAM: MRI HEAD WITHOUT CONTRAST MRA HEAD WITHOUT CONTRAST TECHNIQUE: Multiplanar, multiecho pulse sequences of the brain and surrounding  structures were obtained without intravenous contrast. Angiographic images of the head were obtained using MRA technique without contrast. COMPARISON:  Prior CT from 06/23/2017. FINDINGS: MRI HEAD FINDINGS Brain: Diffuse prominence of the CSF containing spaces compatible with generalized cerebral atrophy. Patchy and confluent T2/FLAIR hyperintensity within the periventricular and deep white matter both cerebral hemispheres most compatible chronic small vessel ischemic disease, moderate nature. Remote lacunar infarcts present within the left thalamus and left basal ganglia. Scattered small remote bilateral cerebellar infarcts present. There are scattered subcentimeter foci of diffusion abnormality involving the bilateral cerebral hemispheres, consistent with small acute ischemic infarcts. These predominantly involve the deep white matter of both cerebral hemispheres, although there is patchy involvement within the deep gray nuclei as well. Reference purposes, largest focus on the right is positioned within the posterior right centrum semi ovale and measures 6 mm (series 3, image 37). Largest focus on the left is positioned within the lateral left thalamus and measures 7 mm (series 3, image 28). No associated mass effect or hemorrhage. Sparing of the brainstem and cerebellum noted. Few punctate foci susceptibility fact noted within the left thalamus, consistent with tiny chronic micro hemorrhages, likely hypertensive in nature. No other evidence for acute or chronic intracranial hemorrhage. No mass lesion, midline shift or mass effect. No hydrocephalus. No extra-axial fluid collection. Major dural sinuses are grossly patent. Incidental note made of a partially empty sella. Suprasellar region normal. Vascular: Major intracranial vascular flow voids are maintained. Skull and upper cervical spine: Craniocervical junction within normal limits. Visualized upper cervical spine unremarkable. Bone marrow signal intensity  within normal limits. No scalp soft tissue abnormality. Sinuses/Orbits: Globes and orbital soft tissues within normal limits. Remote posttraumatic defect noted at the left lamina papyracea. Paranasal sinuses are clear. Small left mastoid effusion noted, of doubtful significance. Inner ear structures grossly normal. MRA HEAD FINDINGS ANTERIOR CIRCULATION: Study degraded by motion artifact. Distal cervical segments of the internal carotid artery is are patent with antegrade flow. Petrous segments patent bilaterally without flow-limiting stenosis. Atheromatous irregularity within the cavernous ICAs without high-grade stenosis. ICA termini widely patent. A1 segments patent. Anterior communicating artery normal. Anterior cerebral arteries demonstrate multifocal atheromatous irregularity but are patent to their distal aspects. Atheromatous irregularity within the left M1 segment without flow-limiting stenosis. Smooth tapering of the distal right MCA with moderate to severe narrowing. MCA bifurcations within normal limits. Proximal M2 vessels not well evaluated due to motion, although did do not appear occluded. Atheromatous irregularity within the distal MCA branches bilaterally. POSTERIOR CIRCULATION: Vertebral arteries patent to the vertebrobasilar junction without flow-limiting stenosis. Left vertebral artery slightly dominant. Posterior inferior cerebral artery is not visualized on this exam. Basilar artery tortuous but widely patent to its distal aspect without flow-limiting stenosis. Superior cerebral arteries patent proximally. Both of the posterior cerebral artery supplied via the basilar and are patent to their distal aspects. Multifocal atheromatous irregularity within the PCAs bilaterally without flow-limiting stenosis. No aneurysm or vascular malformation. IMPRESSION: MRI HEAD IMPRESSION: 1. Multifocal subcentimeter acute ischemic infarcts involving the bilateral cerebral hemispheres as above. Findings are  most likely embolic in nature. No associated hemorrhage or mass effect. 2. Moderate chronic microvascular ischemic disease with additional remote infarcts involving the left basal ganglia, left  thalamus, and bilateral cerebellar hemispheres. MRA HEAD IMPRESSION: 1. Negative intracranial MRA for large vessel occlusion. 2. Moderate atherosclerotic disease involving the anterior posterior circulation as above, most notable at the distal right M1 segment. Electronically Signed   By: Jeannine Boga M.D.   On: 06/24/2017 20:32   Mr Lumbar Spine Wo Contrast  Result Date: 06/21/2017 CLINICAL DATA:  Weakness for 1 week. History of diabetes. Assess for cauda equina syndrome. EXAM: MRI LUMBAR SPINE WITHOUT CONTRAST TECHNIQUE: Multiplanar, multisequence MR imaging of the lumbar spine was performed. No intravenous contrast was administered. COMPARISON:  Lumbar spine radiographs June 21, 2017 at 1651 hours and MRI of lumbar spine May 09, 2015 FINDINGS: SEGMENTATION: For the purposes of this report, the last well-formed intervertebral disc will be reported as L5-S1. ALIGNMENT: Maintained lumbar lordosis. Grade 1 L3-4 anterolisthesis. Minimal grade 1 L4-5 anterolisthesis. Minimal grade 1 L5-S1 retrolisthesis. No spondylolysis. VERTEBRAE:Vertebral bodies are intact. Severe L3-4 and L5-S1 disc height loss progressed from prior examination. Moderate L4-5 disc height loss with disc desiccation all levels compatible with degenerative discs. Focal edema L3-4 disc. Moderate to severe subacute or chronic discogenic endplate changes G3-O7. Mild L3-4 and L4-5 acute on chronic discogenic endplate changes with trace acute component L3-4. Scalloping posterior L3 vertebral body associated with low T1 and bright T2 signal within Batson's plexus. Minimal bone marrow edema L3-4 facet partially volumes with effusion. CONUS MEDULLARIS: Conus medullaris terminates at L1-2 and demonstrates normal morphology and signal characteristics.  Central displacement of the cauda equina with disorganized appearance. PARASPINAL AND SOFT TISSUES: Included prevertebral and paraspinal soft tissues are nonacute. Multiple small low signal masses within the uterus compatible with leiomyoma. Trace L3-4 interspinous fluid signal. DISC LEVELS: T11-12: Small broad-based disc bulge, mild facet arthropathy and ligamentum flavum redundancy. Mild canal stenosis without neural foraminal narrowing. T12-L1, L1-2: No disc bulge, canal stenosis nor neural foraminal narrowing. L2-3: Small broad-based disc bulge. Minimal facet arthropathy without canal stenosis. No neural foraminal narrowing. L3-4: Anterolisthesis. Large broad-based disc bulge with superimposed LEFT central 5 x 6 mm disc extrusion, 12 mm superior migration. Moderate facet arthropathy and ligamentum flavum redundancy with trace facet effusions. Severe canal stenosis. Severe bilateral neural foraminal narrowing and, encroaching upon the exited L 3 nerve. Lateral recess narrowing may affect the traversing L4 nerves. L4-5: Anterolisthesis. Moderate broad-based disc bulge. Moderate to severe facet arthropathy and ligamentum flavum redundancy with trace facet effusions which are likely reactive. Mild canal stenosis including partially effaced LEFT and RIGHT lateral recesses which may affect the traversing L5 nerves. Moderate to severe RIGHT, severe LEFT neural foraminal narrowing. Encroachment upon at LEFT greater than RIGHT exited L4 nerve. L5-S1: Small to moderate broad-based disc bulge asymmetric to the RIGHT. Mild facet arthropathy. No canal stenosis. Severe bilateral neural foraminal narrowing. IMPRESSION: 1. Grade 1 L3-4 and L4-5 anterolisthesis. Grade 1 L5-S1 retrolisthesis. No spondylolysis. 2. Progressed degenerative change of lumbar spine. Superimposed 5 x 6 x 12 mm L3-4 disc extrusion. 3. Severe canal stenosis L3-4 with resultant disorganized cauda equina. Mild canal stenosis L4-5. 4. Severe neural foraminal  narrowing L3-4 through L5-S1. 5. L3-4 inflammatory facet arthropathy. 6. Progressive scalloping of L3 posterior vertebral body suggesting chronic venous changes. Findings would be better characterized on contrast-enhanced images. Electronically Signed   By: Elon Alas M.D.   On: 06/21/2017 18:26   Dg Chest Port 1 View  Result Date: 06/26/2017 CLINICAL DATA:  Cough, shortness of breath, respiratory failure EXAM: PORTABLE CHEST 1 VIEW COMPARISON:  06/24/2017, 06/23/2017 FINDINGS: Stable cardiomegaly  without edema, significant collapse or consolidation by plain radiography. No effusion or pneumothorax. Trachea is midline. Aorta is atherosclerotic and ectatic. No significant interval change. Stable mild hyperinflation. IMPRESSION: Stable cardiomegaly without significant interval change Electronically Signed   By: Jerilynn Mages.  Shick M.D.   On: 06/26/2017 10:07   Dg Chest Port 1 View  Result Date: 06/23/2017 CLINICAL DATA:  Shortness of breath EXAM: PORTABLE CHEST 1 VIEW COMPARISON:  06/21/2017, 05/25/2016 FINDINGS: Moderate cardiomegaly. No overt pulmonary edema. No pleural effusion or focal consolidation. No pneumothorax. Generous mediastinum, likely due to portable technique and patient rotation IMPRESSION: Moderate cardiomegaly.  No edema or infiltrate. Electronically Signed   By: Donavan Foil M.D.   On: 06/23/2017 18:12   Dg Chest Port 1 View  Result Date: 06/21/2017 CLINICAL DATA:  Weakness for 1 week. EXAM: PORTABLE CHEST 1 VIEW COMPARISON:  05/25/2016. FINDINGS: Stable enlarged cardiac silhouette. Mildly prominent pulmonary vasculature with improvement. The interstitial markings are mildly prominent. No pleural fluid seen. Mild bilateral shoulder degenerative changes. IMPRESSION: 1. Mild pulmonary vascular congestion with improvement. 2. Stable cardiomegaly. 3. Mild chronic interstitial lung disease. Electronically Signed   By: Claudie Revering M.D.   On: 06/21/2017 16:52    Microbiology: Recent Results  (from the past 240 hour(s))  MRSA PCR Screening     Status: None   Collection Time: 06/23/17  8:23 PM  Result Value Ref Range Status   MRSA by PCR NEGATIVE NEGATIVE Final    Comment:        The GeneXpert MRSA Assay (FDA approved for NASAL specimens only), is one component of a comprehensive MRSA colonization surveillance program. It is not intended to diagnose MRSA infection nor to guide or monitor treatment for MRSA infections.      Labs: Basic Metabolic Panel:  Recent Labs Lab 06/26/17 1015 06/27/17 0619 06/28/17 0218 06/29/17 0540 06/30/17 0540  NA 141 143 140 142 141  K 4.1 4.0 4.3 4.2 4.1  CL 108 111 110 111 111  CO2 23 23 23 22  21*  GLUCOSE 196* 102* 151* 138* 121*  BUN 27* 25* 26* 24* 22*  CREATININE 1.82* 1.60* 1.83* 1.89* 1.97*  CALCIUM 8.2* 8.1* 8.1* 8.5* 8.5*  MG 1.6* 1.8 1.8 1.7 1.7  PHOS 2.9 3.0 2.5 2.5 2.7   Liver Function Tests:  Recent Labs Lab 06/24/17 0248 06/27/17 0619 06/28/17 0218 06/29/17 0540 06/30/17 0540  AST 21 18 29 30  33  ALT 23 21 24 24 27   ALKPHOS 154* 132* 138* 125 108  BILITOT 1.3* 0.8 0.9 0.5 0.9  PROT 5.3* 4.6* 5.3* 5.2* 5.3*  ALBUMIN 2.2* 1.7* 2.0* 2.0* 2.0*   No results for input(s): LIPASE, AMYLASE in the last 168 hours.  Recent Labs Lab 06/24/17 2156  AMMONIA 63*   CBC:  Recent Labs Lab 06/26/17 1015 06/27/17 0619 06/28/17 0218 06/29/17 0540 06/30/17 0540  WBC 13.3* 10.7* 7.1 5.4 4.9  NEUTROABS 11.8* 9.0* 5.4 3.9 3.3  HGB 13.2 11.7* 12.0 11.6* 12.3  HCT 40.7 36.8 37.9 37.6 38.5  MCV 97.4 96.6 97.7 97.9 98.0  PLT 212 190 193 208 187   Cardiac Enzymes: No results for input(s): CKTOTAL, CKMB, CKMBINDEX, TROPONINI in the last 168 hours. BNP: BNP (last 3 results)  Recent Labs  06/21/17 1637 06/22/17 0337  BNP 1,152.0* 835.0*    ProBNP (last 3 results) No results for input(s): PROBNP in the last 8760 hours.  CBG:  Recent Labs Lab 06/29/17 1618 06/29/17 2144 06/30/17 5188 06/30/17 0747  06/30/17 1124  GLUCAP 128* 119* 112* 141* 157*       Signed:  Cristal Ford  [facility] 06/30/2017, 3:08 PM

## 2017-06-30 NOTE — Progress Notes (Signed)
Discharge to: [facility] Anticipated discharge date: 06/30/17 Family notified: Yes, by phone Transportation by: [facility]  Report #: [phone], Room 101B  CSW signing off.  Laveda Abbe LCSW [phone]

## 2017-06-30 NOTE — Progress Notes (Addendum)
CSW following to facilitate discharge planning. CSW met with patient and brother at bedside to discuss facility selection. Patient indicated that her sister had worked something out at [name] so that patient could admit, and to call over there to arrange everything. Patient and brother at bedside were discussing patient hallucinating aliens coming to get her the other day.  CSW contacted [facility] admissions representative, who denied the report that something had been worked out; patient is still not appropriate for admission and has no bed offer at [facility]. [facility] representative also indicated that she had told the patient's sister the same information, that there would still be no bed offer.  CSW provided information to patient, who was not believing the report that CSW provided. Patient continued to indicate that she would go to [facility] and her sister would work it out for her. CSW obtained permission to contact patient's sister via phone and discuss the miscommunication; CSW left voicemail.  CSW was contacted by patient's other sister who indicated that the siblings had discussed the bed offers that the patient had, and would like to admit to [facility]. Patient's sister indicated that the siblings had all talked the previous evening and that was the choice they liked the best.   CSW to reach out to [facility] representative to confirm bed availability and facilitate discharge when medically ready.  Laveda Abbe, [place] Clinical Social Worker [phone]  UPDATE:  [facility] has a bed available for patient and can admit today. CSW to follow to facilitate discharge if medically ready.  Laveda Abbe, [place] Clinical Social Worker [phone]

## 2017-07-02 DIAGNOSIS — E1165 Type 2 diabetes mellitus with hyperglycemia: Secondary | ICD-10-CM | POA: Diagnosis not present

## 2017-07-02 DIAGNOSIS — M48062 Spinal stenosis, lumbar region with neurogenic claudication: Secondary | ICD-10-CM | POA: Diagnosis not present

## 2017-07-02 DIAGNOSIS — I10 Essential (primary) hypertension: Secondary | ICD-10-CM | POA: Diagnosis not present

## 2017-07-02 DIAGNOSIS — I5023 Acute on chronic systolic (congestive) heart failure: Secondary | ICD-10-CM | POA: Diagnosis not present

## 2017-07-07 ENCOUNTER — Other Ambulatory Visit: Payer: Self-pay | Admitting: *Deleted

## 2017-07-07 NOTE — Patient Outreach (Signed)
[facility]) Care Management  07/07/2017  [name] [birth year] [id number]   Met with patient at facility.  Patient voiced a serious concern to [facility], patient gave RNCM permission to report concern to administration, Patient states she had already reported concern but did not think it had been addressed.   RNCM asked patient if she wanted to try to transfer to another facility but patient states things have been better since her concern took place and does not want to pursue transfer at this time.   Plan RNCM discussed patient concern with Dance movement psychotherapist. They will address concern with patient. RNCM will coordinate care with [facility] LCSW assigned to patinet regarding patient and follow up.  Royetta Crochet. Laymond Purser, RN, BSN, [place] [phone]) Business Cell  [phone]) Toll Free Office

## 2017-07-08 DIAGNOSIS — I509 Heart failure, unspecified: Secondary | ICD-10-CM | POA: Diagnosis not present

## 2017-07-08 DIAGNOSIS — I10 Essential (primary) hypertension: Secondary | ICD-10-CM | POA: Diagnosis not present

## 2017-07-08 DIAGNOSIS — E119 Type 2 diabetes mellitus without complications: Secondary | ICD-10-CM | POA: Diagnosis not present

## 2017-07-12 ENCOUNTER — Other Ambulatory Visit: Payer: Self-pay | Admitting: *Deleted

## 2017-07-12 NOTE — Patient Outreach (Signed)
[name]) Care Management  07/12/2017  [name] 07-20-1966 [id number]   CSW had received referral from [place], [place] that patient had discharged [facility]. CSW confirmed with [facility] that patient is still a resident there & CSW will make initial visit on Wednesday, 9/19. Full assessment & note to follow.    Raynaldo Opitz, LCSW [facility]  Clinical Social Worker cell #: [phone]

## 2017-07-13 ENCOUNTER — Telehealth: Payer: Self-pay | Admitting: Family Medicine

## 2017-07-13 NOTE — Telephone Encounter (Signed)
Message left with answering service that RN may assess pt on 07/16/2017

## 2017-07-13 NOTE — Telephone Encounter (Signed)
Keesa from [facility] left message stating she got referral for patient but start of care has been delayed more than 48 hours. She states that an RN can go out Friday 9/21. If this is okay, she asks for call back at [phone]

## 2017-07-14 ENCOUNTER — Other Ambulatory Visit: Payer: Self-pay | Admitting: *Deleted

## 2017-07-14 ENCOUNTER — Encounter: Payer: Self-pay | Admitting: *Deleted

## 2017-07-14 NOTE — Patient Outreach (Signed)
[facility]) Care Management   07/14/2017  ABENA ERDMAN [birth year] [id number]  RILDA BULLS is an 51 y.o. female  ROS  Physical Exam  Encounter Medications:   Outpatient Encounter Prescriptions as of 07/14/2017  Medication Sig Note  . amLODipine (NORVASC) 10 MG tablet TAKE 1 TABLET BY MOUTH ONCE A DAY.   Marland Kitchen aspirin EC 81 MG EC tablet Take 1 tablet (81 mg total) by mouth daily.   Marland Kitchen azelastine (ASTELIN) 0.1 % nasal spray Place 2 sprays into both nostrils 2 (two) times daily. Use in each nostril as directed 06/21/2017: Last filled 03/2017  . bisoprolol (ZEBETA) 5 MG tablet Take 1 tablet (5 mg total) by mouth daily.   . Choline Fenofibrate 135 MG capsule TAKE 1 CAPSULE BY MOUTH EVERY DAY.   . cloNIDine (CATAPRES) 0.2 MG tablet TAKE (1) TABLET BY MOUTH AT BEDTIME FOR BLOOD PRESSURE.   . clopidogrel (PLAVIX) 75 MG tablet TAKE 1 TABLET BY MOUTH DAILY WITH BREAKFAST.   Marland Kitchen diclofenac sodium (VOLTAREN) 1 % GEL Apply 2 g topically 4 (four) times daily.   Marland Kitchen ezetimibe (ZETIA) 10 MG tablet Take 1 tablet (10 mg total) by mouth daily.   Marland Kitchen FLUoxetine (PROZAC) 20 MG capsule TAKE 1 CAPSULE BY MOUTH EVERY DAY.   . fluticasone (FLONASE) 50 MCG/ACT nasal spray Place 2 sprays into both nostrils daily. 06/21/2017: Last filled 03/2017  . folic acid (FOLVITE) 1 MG tablet Take 1 tablet (1 mg total) by mouth daily.   . furosemide (LASIX) 20 MG tablet TAKE 1 TABLET BY MOUTH DAILY.   Marland Kitchen gabapentin (NEURONTIN) 100 MG capsule TAKE 1 CAPSULE BY MOUTH THREE TIMES A DAY.   . hydrocortisone valerate cream (WESTCORT) 0.2 % Apply topically 2 (two) times daily.   . insulin aspart (NOVOLOG) 100 UNIT/ML injection Inject 0-9 Units into the skin 3 (three) times daily with meals.   Marland Kitchen ipratropium-albuterol (DUONEB) 0.5-2.5 (3) MG/3ML SOLN Take 3 mLs by nebulization every 6 (six) hours as needed. (Patient taking differently: Take 3 mLs by nebulization every 6 (six) hours as needed (for wheezing/shortness of  breath). ) 06/21/2017: Not listed per pharmacy records  . lidocaine (LIDODERM) 5 % Place 1 patch onto the skin daily. Remove & Discard patch within 12 hours or as directed by MD   . montelukast (SINGULAIR) 10 MG tablet TAKE (1) TABLET BY MOUTH AT BEDTIME.   . Multiple Vitamin (MULTIVITAMIN WITH MINERALS) TABS tablet Take 1 tablet by mouth daily.   . nicotine (NICODERM CQ - DOSED IN MG/24 HR) 7 mg/24hr patch Place 1 patch (7 mg total) onto the skin daily.   . pantoprazole (PROTONIX) 40 MG tablet Take 1 tablet (40 mg total) by mouth daily.   . potassium chloride SA (K-DUR,KLOR-CON) 20 MEQ tablet TAKE 1&1/2 TABLET BY MOUTH DAILY.   Marland Kitchen PROAIR HFA 108 (90 Base) MCG/ACT inhaler INHALE 2 PUFFS EVERY 6 HOURS AS NEEDED FOR SHORTNESS OF BREATH/WHEEZING.   . repaglinide (PRANDIN) 1 MG tablet TAKE 1 TABLET BY MOUTH 3 TIMES DAILY BEFORE MEALS.   . rosuvastatin (CRESTOR) 40 MG tablet TAKE 1 TABLET BY MOUTH ONCE A DAY.   Marland Kitchen SPIRIVA HANDIHALER 18 MCG inhalation capsule Place 18 mcg into inhaler and inhale daily.    Marland Kitchen thiamine 100 MG tablet Take 1 tablet (100 mg total) by mouth daily.   . TRADJENTA 5 MG TABS tablet TAKE 1 TABLET BY MOUTH ONCE A DAY.   . traZODone (DESYREL) 50 MG tablet TAKE (1) TABLET  BY MOUTH AT BEDTIME.   Marland Kitchen Vitamin D, Ergocalciferol, (DRISDOL) 50000 units CAPS capsule Take 1 capsule (50,000 Units total) by mouth once a week.    No facility-administered encounter medications on file as of 07/14/2017.     Functional Status:   In your present state of health, do you have any difficulty performing the following activities: 07/14/2017 06/26/2017  Hearing? N N  Vision? N N  Comment - -  Difficulty concentrating or making decisions? N N  Walking or climbing stairs? N Y  Dressing or bathing? N N  Doing errands, shopping? N N  Preparing Food and eating ? - -  Using the Toilet? - -  In the past six months, have you accidently leaked urine? - -  Do you have problems with loss of bowel control? - -   Managing your Medications? - -  Managing your Finances? - -  Housekeeping or managing your Housekeeping? - -  Some recent data might be hidden    Fall/Depression Screening:    Fall Risk  07/14/2017 12/15/2016 06/24/2016  Falls in the past year? Yes No No  Number falls in past yr: 1 - -  Injury with Fall? No - -  Risk for fall due to : Impaired mobility - Medication side effect  Follow up Falls prevention discussed - -   PHQ 2/9 Scores 07/14/2017 12/15/2016 06/24/2016 06/18/2016 09/03/2015 05/24/2014 10/30/2013  PHQ - 2 Score 0 _0 PHQ- 9 Score - _1 Assessment:   CSW had received referral from [facility], [place] that patient was discharging to [employer] from [place]. CSW met with patient at SNF in the dining room while she waited for her lunch. Patient informed CSW that she has really enjoyed her stay at SNF, despite some care concerns and has appealed (and won) her discharge. Patient states that she does not feel ready/strong enough to leave SNF - that she was living alone with a caregiver through [facility] 7 days a week (7 hours during the week & 3 hours on the weekends). [facility] had since been closed down by the state and patient reports that her being admitted to the hospital is a direct result of not getting a replacement caregiver. Patient states that she had been living at the old [facility] in [place] but feels that she would need to stay at [facility] until she is able to atleast be able to go to the bathroom by herself. Patient states that her son, [name] is willing to be her caregiver once she does discharge from SNF but patient states that she would not want to move in with him permanently as she has an 53 year old chichuahua, Quillian Quince that does not get along with patient's 2 sons (82 and 4 years old). CSW put calls out to [employer], [place]. Patient reported to CSW that she receives a monthly check for  $1039. CSW spoke with patient about Assisted Living Facilities if she is discharged before she is fully independent, but patient states that she would not want to give up her full check, minus $35 (which is the standard for all ALFs).   Plan:   CSW will follow-up with patient next week & communicate with [place], [place] worker at [facility].   [facility] CM Care Plan Problem One     Most Recent Value  Care Plan Problem  One  Client needs to communicate with CSW to discuss housing options for client in the area  Role Documenting the Problem One  [place] for Problem One  Active  [place] Term Goal   Patient will coordinate with [place] housing options and successfully discharge from SNF when ready  [place] Term Goal Start Date  07/14/17  Interventions for Problem One Long Term Goal  CSW communicated with patient possible options upon discharge from SNF       Raynaldo Opitz, [place] Social Worker cell #: [phone]

## 2017-07-15 DIAGNOSIS — I70242 Atherosclerosis of native arteries of left leg with ulceration of calf: Secondary | ICD-10-CM | POA: Diagnosis not present

## 2017-07-15 DIAGNOSIS — I70232 Atherosclerosis of native arteries of right leg with ulceration of calf: Secondary | ICD-10-CM | POA: Diagnosis not present

## 2017-07-16 DIAGNOSIS — M48061 Spinal stenosis, lumbar region without neurogenic claudication: Secondary | ICD-10-CM | POA: Diagnosis not present

## 2017-07-16 DIAGNOSIS — G834 Cauda equina syndrome: Secondary | ICD-10-CM | POA: Diagnosis not present

## 2017-07-16 DIAGNOSIS — M24671 Ankylosis, right ankle: Secondary | ICD-10-CM | POA: Diagnosis not present

## 2017-07-21 ENCOUNTER — Other Ambulatory Visit: Payer: Self-pay | Admitting: *Deleted

## 2017-07-21 NOTE — Patient Outreach (Signed)
[facility]) Care Management  07/21/2017  [name] [birth year] [id number]   CSW met with patient at [facility] to follow-up on initial SNF visit. CSW provided patient with resources for low-income/disabilty housing. Patient reports that she was familiar with some of the apartments listed, she had previously lived at [facility] and expressed that she plans on giving them and others a call. Patient reports that she met with [facility] representative at [facility] earlier this week and that she was told that she has "another week" there. Patient states that she will call CSW once she is discharged from SNF to alert [place] for follow-up care.    Raynaldo Opitz, LCSW [facility]  Clinical Social Worker cell #: [phone]

## 2017-07-22 DIAGNOSIS — L97419 Non-pressure chronic ulcer of right heel and midfoot with unspecified severity: Secondary | ICD-10-CM | POA: Diagnosis not present

## 2017-07-28 ENCOUNTER — Ambulatory Visit: Payer: Self-pay | Admitting: Family Medicine

## 2017-07-29 DIAGNOSIS — I70232 Atherosclerosis of native arteries of right leg with ulceration of calf: Secondary | ICD-10-CM | POA: Diagnosis not present

## 2017-08-02 ENCOUNTER — Other Ambulatory Visit: Payer: Self-pay | Admitting: *Deleted

## 2017-08-02 ENCOUNTER — Inpatient Hospital Stay (HOSPITAL_COMMUNITY)
Admission: EM | Admit: 2017-08-02 | Discharge: 2017-08-06 | DRG: 602 | Disposition: A | Discharge status: Skilled Nursing Facility | Payer: Medicare Other | Attending: Family Medicine | Admitting: Family Medicine

## 2017-08-02 ENCOUNTER — Emergency Department (HOSPITAL_COMMUNITY): Payer: Medicare Other

## 2017-08-02 ENCOUNTER — Encounter (HOSPITAL_COMMUNITY): Payer: Self-pay

## 2017-08-02 DIAGNOSIS — I639 Cerebral infarction, unspecified: Secondary | ICD-10-CM | POA: Diagnosis not present

## 2017-08-02 DIAGNOSIS — I43 Cardiomyopathy in diseases classified elsewhere: Secondary | ICD-10-CM | POA: Diagnosis not present

## 2017-08-02 DIAGNOSIS — L03116 Cellulitis of left lower limb: Secondary | ICD-10-CM | POA: Diagnosis not present

## 2017-08-02 DIAGNOSIS — D649 Anemia, unspecified: Secondary | ICD-10-CM

## 2017-08-02 DIAGNOSIS — M549 Dorsalgia, unspecified: Secondary | ICD-10-CM | POA: Diagnosis not present

## 2017-08-02 DIAGNOSIS — L97909 Non-pressure chronic ulcer of unspecified part of unspecified lower leg with unspecified severity: Secondary | ICD-10-CM | POA: Diagnosis not present

## 2017-08-02 DIAGNOSIS — N183 Chronic kidney disease, stage 3 unspecified: Secondary | ICD-10-CM | POA: Diagnosis present

## 2017-08-02 DIAGNOSIS — I471 Supraventricular tachycardia: Secondary | ICD-10-CM | POA: Diagnosis present

## 2017-08-02 DIAGNOSIS — I110 Hypertensive heart disease with heart failure: Secondary | ICD-10-CM | POA: Diagnosis not present

## 2017-08-02 DIAGNOSIS — I4949 Other premature depolarization: Secondary | ICD-10-CM | POA: Diagnosis present

## 2017-08-02 DIAGNOSIS — S80822A Blister (nonthermal), left lower leg, initial encounter: Secondary | ICD-10-CM | POA: Diagnosis present

## 2017-08-02 DIAGNOSIS — F1290 Cannabis use, unspecified, uncomplicated: Secondary | ICD-10-CM | POA: Diagnosis present

## 2017-08-02 DIAGNOSIS — I10 Essential (primary) hypertension: Secondary | ICD-10-CM | POA: Diagnosis not present

## 2017-08-02 DIAGNOSIS — L97211 Non-pressure chronic ulcer of right calf limited to breakdown of skin: Secondary | ICD-10-CM | POA: Diagnosis not present

## 2017-08-02 DIAGNOSIS — N6320 Unspecified lump in the left breast, unspecified quadrant: Secondary | ICD-10-CM | POA: Diagnosis not present

## 2017-08-02 DIAGNOSIS — D75839 Thrombocytosis, unspecified: Secondary | ICD-10-CM

## 2017-08-02 DIAGNOSIS — I5021 Acute systolic (congestive) heart failure: Secondary | ICD-10-CM | POA: Diagnosis not present

## 2017-08-02 DIAGNOSIS — S80821A Blister (nonthermal), right lower leg, initial encounter: Secondary | ICD-10-CM | POA: Diagnosis present

## 2017-08-02 DIAGNOSIS — F17210 Nicotine dependence, cigarettes, uncomplicated: Secondary | ICD-10-CM | POA: Diagnosis present

## 2017-08-02 DIAGNOSIS — I5042 Chronic combined systolic (congestive) and diastolic (congestive) heart failure: Secondary | ICD-10-CM | POA: Diagnosis not present

## 2017-08-02 DIAGNOSIS — X58XXXA Exposure to other specified factors, initial encounter: Secondary | ICD-10-CM | POA: Diagnosis present

## 2017-08-02 DIAGNOSIS — J449 Chronic obstructive pulmonary disease, unspecified: Secondary | ICD-10-CM | POA: Diagnosis present

## 2017-08-02 DIAGNOSIS — F1410 Cocaine abuse, uncomplicated: Secondary | ICD-10-CM | POA: Diagnosis present

## 2017-08-02 DIAGNOSIS — L97929 Non-pressure chronic ulcer of unspecified part of left lower leg with unspecified severity: Secondary | ICD-10-CM | POA: Diagnosis not present

## 2017-08-02 DIAGNOSIS — F419 Anxiety disorder, unspecified: Secondary | ICD-10-CM | POA: Diagnosis not present

## 2017-08-02 DIAGNOSIS — E1140 Type 2 diabetes mellitus with diabetic neuropathy, unspecified: Secondary | ICD-10-CM | POA: Diagnosis present

## 2017-08-02 DIAGNOSIS — Z7902 Long term (current) use of antithrombotics/antiplatelets: Secondary | ICD-10-CM

## 2017-08-02 DIAGNOSIS — I6330 Cerebral infarction due to thrombosis of unspecified cerebral artery: Secondary | ICD-10-CM | POA: Diagnosis not present

## 2017-08-02 DIAGNOSIS — E785 Hyperlipidemia, unspecified: Secondary | ICD-10-CM | POA: Diagnosis present

## 2017-08-02 DIAGNOSIS — E1129 Type 2 diabetes mellitus with other diabetic kidney complication: Secondary | ICD-10-CM | POA: Diagnosis not present

## 2017-08-02 DIAGNOSIS — L97919 Non-pressure chronic ulcer of unspecified part of right lower leg with unspecified severity: Secondary | ICD-10-CM

## 2017-08-02 DIAGNOSIS — E876 Hypokalemia: Secondary | ICD-10-CM | POA: Diagnosis not present

## 2017-08-02 DIAGNOSIS — F1910 Other psychoactive substance abuse, uncomplicated: Secondary | ICD-10-CM | POA: Diagnosis present

## 2017-08-02 DIAGNOSIS — L0390 Cellulitis, unspecified: Secondary | ICD-10-CM | POA: Diagnosis present

## 2017-08-02 DIAGNOSIS — L988 Other specified disorders of the skin and subcutaneous tissue: Secondary | ICD-10-CM | POA: Diagnosis not present

## 2017-08-02 DIAGNOSIS — I5043 Acute on chronic combined systolic (congestive) and diastolic (congestive) heart failure: Secondary | ICD-10-CM | POA: Diagnosis present

## 2017-08-02 DIAGNOSIS — R262 Difficulty in walking, not elsewhere classified: Secondary | ICD-10-CM | POA: Diagnosis not present

## 2017-08-02 DIAGNOSIS — I130 Hypertensive heart and chronic kidney disease with heart failure and stage 1 through stage 4 chronic kidney disease, or unspecified chronic kidney disease: Secondary | ICD-10-CM | POA: Diagnosis present

## 2017-08-02 DIAGNOSIS — E11610 Type 2 diabetes mellitus with diabetic neuropathic arthropathy: Secondary | ICD-10-CM | POA: Diagnosis present

## 2017-08-02 DIAGNOSIS — M48061 Spinal stenosis, lumbar region without neurogenic claudication: Secondary | ICD-10-CM | POA: Diagnosis present

## 2017-08-02 DIAGNOSIS — I472 Ventricular tachycardia: Secondary | ICD-10-CM | POA: Diagnosis present

## 2017-08-02 DIAGNOSIS — Z9114 Patient's other noncompliance with medication regimen: Secondary | ICD-10-CM

## 2017-08-02 DIAGNOSIS — M7989 Other specified soft tissue disorders: Secondary | ICD-10-CM | POA: Diagnosis not present

## 2017-08-02 DIAGNOSIS — G834 Cauda equina syndrome: Secondary | ICD-10-CM | POA: Diagnosis not present

## 2017-08-02 DIAGNOSIS — Z7982 Long term (current) use of aspirin: Secondary | ICD-10-CM

## 2017-08-02 DIAGNOSIS — D631 Anemia in chronic kidney disease: Secondary | ICD-10-CM | POA: Diagnosis not present

## 2017-08-02 DIAGNOSIS — Z79899 Other long term (current) drug therapy: Secondary | ICD-10-CM

## 2017-08-02 DIAGNOSIS — I428 Other cardiomyopathies: Secondary | ICD-10-CM | POA: Diagnosis not present

## 2017-08-02 DIAGNOSIS — M6281 Muscle weakness (generalized): Secondary | ICD-10-CM | POA: Diagnosis not present

## 2017-08-02 DIAGNOSIS — L97821 Non-pressure chronic ulcer of other part of left lower leg limited to breakdown of skin: Secondary | ICD-10-CM | POA: Diagnosis not present

## 2017-08-02 DIAGNOSIS — Z794 Long term (current) use of insulin: Secondary | ICD-10-CM

## 2017-08-02 DIAGNOSIS — L03115 Cellulitis of right lower limb: Principal | ICD-10-CM | POA: Diagnosis present

## 2017-08-02 DIAGNOSIS — E1151 Type 2 diabetes mellitus with diabetic peripheral angiopathy without gangrene: Secondary | ICD-10-CM | POA: Diagnosis not present

## 2017-08-02 DIAGNOSIS — E1165 Type 2 diabetes mellitus with hyperglycemia: Secondary | ICD-10-CM | POA: Diagnosis not present

## 2017-08-02 DIAGNOSIS — G8929 Other chronic pain: Secondary | ICD-10-CM | POA: Diagnosis present

## 2017-08-02 DIAGNOSIS — F410 Panic disorder [episodic paroxysmal anxiety] without agoraphobia: Secondary | ICD-10-CM | POA: Diagnosis present

## 2017-08-02 DIAGNOSIS — N1832 Chronic kidney disease, stage 3b: Secondary | ICD-10-CM | POA: Diagnosis present

## 2017-08-02 DIAGNOSIS — D509 Iron deficiency anemia, unspecified: Secondary | ICD-10-CM | POA: Diagnosis present

## 2017-08-02 DIAGNOSIS — F322 Major depressive disorder, single episode, severe without psychotic features: Secondary | ICD-10-CM | POA: Diagnosis present

## 2017-08-02 DIAGNOSIS — I420 Dilated cardiomyopathy: Secondary | ICD-10-CM | POA: Diagnosis not present

## 2017-08-02 DIAGNOSIS — F329 Major depressive disorder, single episode, unspecified: Secondary | ICD-10-CM | POA: Diagnosis present

## 2017-08-02 DIAGNOSIS — M2140 Flat foot [pes planus] (acquired), unspecified foot: Secondary | ICD-10-CM | POA: Diagnosis present

## 2017-08-02 DIAGNOSIS — L97321 Non-pressure chronic ulcer of left ankle limited to breakdown of skin: Secondary | ICD-10-CM | POA: Diagnosis not present

## 2017-08-02 DIAGNOSIS — N179 Acute kidney failure, unspecified: Secondary | ICD-10-CM | POA: Diagnosis present

## 2017-08-02 DIAGNOSIS — D473 Essential (hemorrhagic) thrombocythemia: Secondary | ICD-10-CM | POA: Diagnosis present

## 2017-08-02 DIAGNOSIS — R000 Tachycardia, unspecified: Secondary | ICD-10-CM

## 2017-08-02 DIAGNOSIS — E1122 Type 2 diabetes mellitus with diabetic chronic kidney disease: Secondary | ICD-10-CM | POA: Diagnosis not present

## 2017-08-02 DIAGNOSIS — S92514D Nondisplaced fracture of proximal phalanx of right lesser toe(s), subsequent encounter for fracture with routine healing: Secondary | ICD-10-CM | POA: Diagnosis not present

## 2017-08-02 DIAGNOSIS — E1159 Type 2 diabetes mellitus with other circulatory complications: Secondary | ICD-10-CM | POA: Diagnosis present

## 2017-08-02 DIAGNOSIS — E11622 Type 2 diabetes mellitus with other skin ulcer: Secondary | ICD-10-CM | POA: Diagnosis not present

## 2017-08-02 DIAGNOSIS — L97221 Non-pressure chronic ulcer of left calf limited to breakdown of skin: Secondary | ICD-10-CM | POA: Diagnosis not present

## 2017-08-02 HISTORY — DX: Thrombocytosis, unspecified: D75.839

## 2017-08-02 LAB — BASIC METABOLIC PANEL
Anion gap: 12 (ref 5–15)
BUN: 21 mg/dL — ABNORMAL HIGH (ref 6–20)
CO2: 24 mmol/L (ref 22–32)
Calcium: 9.5 mg/dL (ref 8.9–10.3)
Chloride: 108 mmol/L (ref 101–111)
Creatinine, Ser: 1.26 mg/dL — ABNORMAL HIGH (ref 0.44–1.00)
GFR calc Af Amer: 55 mL/min — ABNORMAL LOW (ref >60)
GFR calc non Af Amer: 48 mL/min — ABNORMAL LOW (ref >60)
Glucose, Bld: 95 mg/dL (ref 65–99)
Potassium: 3.9 mmol/L (ref 3.5–5.1)
Sodium: 144 mmol/L (ref 135–145)

## 2017-08-02 LAB — CBC WITH DIFFERENTIAL/PLATELET
Basophils Absolute: 0 10*3/uL (ref 0.0–0.1)
Basophils Relative: 0 %
Eosinophils Absolute: 0.1 10*3/uL (ref 0.0–0.7)
Eosinophils Relative: 1 %
HCT: 33.9 % — ABNORMAL LOW (ref 36.0–46.0)
Hemoglobin: 10.4 g/dL — ABNORMAL LOW (ref 12.0–15.0)
Lymphocytes Relative: 10 %
Lymphs Abs: 1.3 10*3/uL (ref 0.7–4.0)
MCH: 30.5 pg (ref 26.0–34.0)
MCHC: 30.7 g/dL (ref 30.0–36.0)
MCV: 99.4 fL (ref 78.0–100.0)
Monocytes Absolute: 0.9 10*3/uL (ref 0.1–1.0)
Monocytes Relative: 7 %
Neutro Abs: 10.6 10*3/uL — ABNORMAL HIGH (ref 1.7–7.7)
Neutrophils Relative %: 82 %
Platelets: 465 10*3/uL — ABNORMAL HIGH (ref 150–400)
RBC: 3.41 MIL/uL — ABNORMAL LOW (ref 3.87–5.11)
RDW: 15.1 % (ref 11.5–15.5)
WBC: 13 10*3/uL — ABNORMAL HIGH (ref 4.0–10.5)

## 2017-08-02 LAB — CBG MONITORING, ED: Glucose-Capillary: 104 mg/dL — ABNORMAL HIGH (ref 65–99)

## 2017-08-02 LAB — I-STAT CG4 LACTIC ACID, ED: Lactic Acid, Venous: 1.35 mmol/L (ref 0.5–1.9)

## 2017-08-02 MED ORDER — AMOXICILLIN-POT CLAVULANATE 875-125 MG PO TABS
1.0000 | ORAL_TABLET | Freq: Once | ORAL | Status: AC
  Administered 2017-08-02: 1 via ORAL
  Filled 2017-08-02: qty 1

## 2017-08-02 MED ORDER — DOXYCYCLINE HYCLATE 100 MG PO TABS
100.0000 mg | ORAL_TABLET | Freq: Once | ORAL | Status: AC
  Administered 2017-08-02: 100 mg via ORAL
  Filled 2017-08-02: qty 1

## 2017-08-02 NOTE — ED Provider Notes (Addendum)
Patient accepted at change of shift. 51 year old female with known history of alcohol and cocaine abuse recently admitted with multiple comorbidities including an acute stroke, congestive heart failure exacerbation, generalized weakness and had a prolonged hospitalization, ended up going to a nursing facility and has been home for 3 days. She developed multiple wounds on her legs including her foot and her left leg just distal to the knee. The right foot has since started to have a foul smell and when the home health nurse came to change her dressings today Center for further evaluation because of that. Additionally the patient tells me that she has been feeling palpitations on and off throughout the day. She denies any prior history of arrhythmia but has definitely had congestive heart failure.  Echocardiogram showed left ventricular ejection fraction of 30-35% as of August 2018  D/w Dr. Olevia Bowens - will admit   Noemi Chapel, MD 08/02/17 2329   ED ECG REPORT  I personally interpreted this EKG   Date: 08/02/2017   Rate: 110  Rhythm: sinus tachycardia  QRS Axis: left  Intervals: normal  ST/T Wave abnormalities: nonspecific ST/T changes  Conduction Disutrbances:nonspecific intraventricular conduction delay  Narrative Interpretation:   Old EKG Reviewed: none available     Noemi Chapel, MD 08/02/17 2334

## 2017-08-02 NOTE — Patient Outreach (Signed)
[facility]) Care Management  08/02/2017  [name] 05-Dec-1965 [id number]  Per [place] patient discharged home with home care services 07/31/17. Plan to let [facility] care team know of discharge disposition. Royetta Crochet. Laymond Purser, RN, BSN, [place] [phone]) Business Cell  [phone]) Toll Free Office

## 2017-08-02 NOTE — H&P (Signed)
History and Physical    [name] URK:[id number] DOB: [birth year] DOA: 08/02/2017  PCP: [name], MD   Patient coming from: Home.  I have personally briefly reviewed patient's old medical records in [place]  Chief Complaint: Blisters on legs.  HPI: [name] is a 51 y.o. female with medical history significant of alcohol use, previous tobacco, cocaine and cannabis use, anxiety, panic attacks, depression, history of left breast mass, chronic kidney disease, COPD, type 2 diabetes, diabetic Charcot foot, diabetic neuropathy, essential hypertension, history of GI bleed, hyperlipidemia, recent thalamic CVA who just came home on Saturday from rehabilitation and comes to the emergency department reporting of worsening of bilateral lower extremities blisters. She was seen earlier today by her home health nurse who noticed that her right foot wound was started to have a foul smell and asked her to come to the hospital for further evaluation. She denies fever, chills, but feels fatigued. No headache, sore throat, productive cough, wheezing, chest pain, palpitations, dizziness, diaphoresis, PND orthopnea. Denies abdominal pain, nausea, emesis, diarrhea or constipation. No dysuria, no frequency or gross hematuria. She denies polyuria, blurred vision or polydipsia.  ED Course: Initial vital signs in the emergency department temperature 90.39F, pulse 118, blood pressure 159/101 mmHg, respirations 16 and O2 sat 100% on room air. She received an Augmentin 875 mg and doxycycline 100 mg tablet in the emergency department.  Her workup shows WBC of 13 with 82% neutrophils, hemoglobin 10.4 g/dL and platelets 465. Sodium 144, potassium 3.9, chloride 108, bicarbonate 24 mmol/L. BUN was 21, creatinine 1.26 and glucose 95 mg/dL. Her lactic acid was 1.35 mmol/L.Marland Kitchen  Imaging: Her right foot x-ray showsExtensive soft tissue swelling and pes planus with midfoot degenerative changes. Nondisplaced  fracture at base of proximal phalanx RIGHT third toe.  Review of Systems: As per HPI otherwise 10 point review of systems negative.    Past Medical History:  Diagnosis Date  . Alcohol use   . Ankle fracture, lateral malleolus, closed 12/30/2011  . Anxiety   . Breast mass, left 12/15/2011  . CKD (chronic kidney disease)   . COPD (chronic obstructive pulmonary disease) ([place])   . Diabetes mellitus, type 2 ([place])   . Diabetic Charcot foot ([place]) 12/30/2011  . Essential hypertension   . History of GI bleed 12/05/2009   Qualifier: Diagnosis of  By: Zeb Comfort    . Hyperlipidemia   . Hypertensive cardiomyopathy ([place]) 11/08/2012   a. 05/2016: echo showing EF of 20-25% with normal cors by cath in 07/2016.  Marland Kitchen Noncompliance with medication regimen   . Obesity   . Panic attacks   . Tobacco abuse     Past Surgical History:  Procedure Laterality Date  . BREAST BIOPSY    . CARDIAC CATHETERIZATION N/A 07/28/2016   Procedure: Left Heart Cath and Coronary Angiography;  Surgeon: Jettie Booze, MD;  Location: [facility];  Service: Cardiovascular;  Laterality: N/A;  . DILATION AND CURETTAGE OF UTERUS       reports that she has been smoking Cigarettes.  She has a 20.00 pack-year smoking history. She has never used smokeless tobacco. She reports that she uses drugs, including Marijuana and Cocaine, about 3 times per week. She reports that she does not drink alcohol.  No Known Allergies  Family History  Problem Relation Age of Onset  . Hypertension Mother   . Heart attack Mother   . Hypertension Father        CABG   .  Hypertension Sister   . Hypertension Brother   . Hypertension Sister   . Cancer Sister        breast   . Arthritis Unknown   . Cancer Unknown   . Diabetes Unknown   . Asthma Unknown   . Hypertension Daughter   . Hypertension Son     Prior to Admission medications   Medication Sig Start Date End Date Taking? Authorizing Provider  amLODipine (NORVASC) 10 MG  tablet TAKE 1 TABLET BY MOUTH ONCE A DAY. 02/01/17   [name], MD  aspirin EC 81 MG EC tablet Take 1 tablet (81 mg total) by mouth daily. 07/01/17   Mikhail, Velta Addison, DO  azelastine (ASTELIN) 0.1 % nasal spray Place 2 sprays into both nostrils 2 (two) times daily. Use in each nostril as directed 04/02/17   [name], MD  bisoprolol (ZEBETA) 5 MG tablet Take 1 tablet (5 mg total) by mouth daily. 04/26/17   [name], MD  Choline Fenofibrate 135 MG capsule TAKE 1 CAPSULE BY MOUTH EVERY DAY. 06/01/17   [name], MD  cloNIDine (CATAPRES) 0.2 MG tablet TAKE (1) TABLET BY MOUTH AT BEDTIME FOR BLOOD PRESSURE. 11/30/16   [name], MD  clopidogrel (PLAVIX) 75 MG tablet TAKE 1 TABLET BY MOUTH DAILY WITH BREAKFAST. 06/01/17   [name], MD  diclofenac sodium (VOLTAREN) 1 % GEL Apply 2 g topically 4 (four) times daily. 06/30/17   Mikhail, Velta Addison, DO  ezetimibe (ZETIA) 10 MG tablet Take 1 tablet (10 mg total) by mouth daily. 04/26/17   [name], MD  FLUoxetine (PROZAC) 20 MG capsule TAKE 1 CAPSULE BY MOUTH EVERY DAY. 06/01/17   [name], MD  fluticasone [facility]) 50 MCG/ACT nasal spray Place 2 sprays into both nostrils daily. 02/08/17   [name], MD  folic acid (FOLVITE) 1 MG tablet Take 1 tablet (1 mg total) by mouth daily. 07/01/17   Mikhail, Velta Addison, DO  furosemide (LASIX) 20 MG tablet TAKE 1 TABLET BY MOUTH DAILY. 06/29/17   Herminio Commons, MD  gabapentin (NEURONTIN) 100 MG capsule TAKE 1 CAPSULE BY MOUTH THREE TIMES A DAY. 06/01/17   [name], MD  hydrocortisone valerate cream (WESTCORT) 0.2 % Apply topically 2 (two) times daily. 06/30/17   Mikhail, Velta Addison, DO  insulin aspart (NOVOLOG) 100 UNIT/ML injection Inject 0-9 Units into the skin 3 (three) times daily with meals. 06/30/17   Mikhail, Velta Addison, DO  ipratropium-albuterol (DUONEB) 0.5-2.5 (3) MG/3ML SOLN Take 3 mLs by nebulization every 6 (six) hours as needed. Patient  taking differently: Take 3 mLs by nebulization every 6 (six) hours as needed (for wheezing/shortness of breath).  03/10/16   [name], MD  lidocaine (LIDODERM) 5 % Place 1 patch onto the skin daily. Remove & Discard patch within 12 hours or as directed by MD 07/01/17   Cristal Ford, DO  montelukast (SINGULAIR) 10 MG tablet TAKE (1) TABLET BY MOUTH AT BEDTIME. 06/01/17   [name], MD  Multiple Vitamin (MULTIVITAMIN WITH MINERALS) TABS tablet Take 1 tablet by mouth daily. 07/01/17   Mikhail, Velta Addison, DO  nicotine (NICODERM CQ - DOSED IN MG/24 HR) 7 mg/24hr patch Place 1 patch (7 mg total) onto the skin daily. 07/01/17   Mikhail, Velta Addison, DO  pantoprazole (PROTONIX) 40 MG tablet Take 1 tablet (40 mg total) by mouth daily. 04/26/17   [name], MD  potassium chloride SA (K-DUR,KLOR-CON) 20 MEQ tablet TAKE 1&1/2  TABLET BY MOUTH DAILY. 06/01/17   [name], MD  PROAIR HFA 108 [phone] Base) MCG/ACT inhaler INHALE 2 PUFFS EVERY 6 HOURS AS NEEDED FOR SHORTNESS OF BREATH/WHEEZING. 06/01/17   [name], MD  repaglinide (PRANDIN) 1 MG tablet TAKE 1 TABLET BY MOUTH 3 TIMES DAILY BEFORE MEALS. 11/30/16   [name], MD  rosuvastatin (CRESTOR) 40 MG tablet TAKE 1 TABLET BY MOUTH ONCE A DAY. 06/01/17   [name], MD  SPIRIVA HANDIHALER 18 MCG inhalation capsule Place 18 mcg into inhaler and inhale daily.  05/13/16   [provider]  thiamine 100 MG tablet Take 1 tablet (100 mg total) by mouth daily. 07/01/17   Mikhail, Velta Addison, DO  TRADJENTA 5 MG TABS tablet TAKE 1 TABLET BY MOUTH ONCE A DAY. 06/01/17   [name], MD  traZODone (DESYREL) 50 MG tablet TAKE (1) TABLET BY MOUTH AT BEDTIME. 06/01/17   [name], MD  Vitamin D, Ergocalciferol, (DRISDOL) 50000 units CAPS capsule Take 1 capsule (50,000 Units total) by mouth once a week. 03/02/17   [name], MD    Physical Exam: Vitals:   08/02/17 1738 08/02/17 2200 08/02/17 2300  BP: (!)  159/101 (!) 167/121 125/90  Pulse: (!) 118 91 87  Resp: 16 17 17   Temp: 98 F (36.7 C)    TempSrc: Oral    SpO2: 100% 97% 97%  Weight: 60.3 kg (133 lb)    Height: 5\' 1"  (1.549 m)      Constitutional: NAD, calm, comfortable Eyes: PERRL, lids and conjunctivae normal ENMT: Mucous membranes are moist. Posterior pharynx clear of any exudate or lesions. Neck: normal, supple, no masses, no thyromegaly Respiratory: clear to auscultation bilaterally, no wheezing, no crackles. Normal respiratory effort. No accessory muscle use.  Cardiovascular: Tachycardic at 109 BPM, no murmurs / rubs / gallops. No pitting lower extremity edema. 2+ pedal pulses. No carotid bruits.  Abdomen: Soft, no tenderness, no masses palpated. No hepatosplenomegaly. Bowel sounds positive.  Musculoskeletal: no clubbing / cyanosis. Good ROM, no contractures. Normal muscle tone.  Skin: Multiple lower extremities wounds in different stages of healing. Please see below images for further detail. Neurologic: CN 2-12 grossly intact. Sensation intact, DTR normal. Strength 5/5 in all 4.  Psychiatric: Normal judgment and insight. Alert and oriented x 3. Normal mood.          Labs on Admission: I have personally reviewed following labs and imaging studies  CBC:  Recent Labs Lab 08/02/17 2108  WBC 13.0*  NEUTROABS 10.6*  HGB 10.4*  HCT 33.9*  MCV 99.4  PLT 154*   Basic Metabolic Panel:  Recent Labs Lab 08/02/17 2108  NA 144  K 3.9  CL 108  CO2 24  GLUCOSE 95  BUN 21*  CREATININE 1.26*  CALCIUM 9.5   GFR: Estimated Creatinine Clearance: 43 mL/min (A) (by C-G formula based on SCr of 1.26 mg/dL (H)). Liver Function Tests: No results for input(s): AST, ALT, ALKPHOS, BILITOT, PROT, ALBUMIN in the last 168 hours. No results for input(s): LIPASE, AMYLASE in the last 168 hours. No results for input(s): AMMONIA in the last 168 hours. Coagulation Profile: No results for input(s): INR, PROTIME in the last 168  hours. Cardiac Enzymes: No results for input(s): CKTOTAL, CKMB, CKMBINDEX, TROPONINI in the last 168 hours. BNP (last 3 results) No results for input(s): PROBNP in the last 8760 hours. HbA1C: No results for input(s): HGBA1C in the last 72 hours. CBG:  Recent Labs  Lab 08/02/17 1744  GLUCAP 104*   Lipid Profile: No results for input(s): CHOL, HDL, LDLCALC, TRIG, CHOLHDL, LDLDIRECT in the last 72 hours. Thyroid Function Tests: No results for input(s): TSH, T4TOTAL, FREET4, T3FREE, THYROIDAB in the last 72 hours. Anemia Panel: No results for input(s): VITAMINB12, FOLATE, FERRITIN, TIBC, IRON, RETICCTPCT in the last 72 hours. Urine analysis:    Component Value Date/Time   COLORURINE YELLOW 06/21/2017 1601   APPEARANCEUR HAZY (A) 06/21/2017 1601   LABSPEC 1.022 06/21/2017 1601   PHURINE 6.0 06/21/2017 1601   GLUCOSEU >=500 (A) 06/21/2017 1601   HGBUR SMALL (A) 06/21/2017 1601   BILIRUBINUR NEGATIVE 06/21/2017 1601   KETONESUR NEGATIVE 06/21/2017 1601   PROTEINUR >=300 (A) 06/21/2017 1601   UROBILINOGEN 0.2 09/11/2014 1319   NITRITE NEGATIVE 06/21/2017 1601   LEUKOCYTESUR MODERATE (A) 06/21/2017 1601    Radiological Exams on Admission: No results found.   Echo complete 06/22/2017. ------------------------------------------------------------------- LV EF: 30% -   35%  ------------------------------------------------------------------- Indications:      CHF - 428.0.  ------------------------------------------------------------------- History:   PMH:  Polysubstance abuse CKD.  Cardiomyopathy.  Chronic obstructive pulmonary disease.  Risk factors:  Diabetes mellitus. Dyslipidemia.  ------------------------------------------------------------------- Study Conclusions  - Left ventricle: The cavity size was normal. Wall thickness was   increased in a pattern of moderate LVH. Systolic function was   moderately to severely reduced. The estimated ejection fraction   was  in the range of 30% to 35%. Diffuse hypokinesis. Features are   consistent with a pseudonormal left ventricular filling pattern,   with concomitant abnormal relaxation and increased filling   pressure (grade 2 diastolic dysfunction). - Mitral valve: There was moderate regurgitation directed   posteriorly. - Left atrium: The atrium was mildly dilated.  EKG: Independently reviewed. Vent. rate 115 BPM PR interval * ms QRS duration 95 ms QT/QTc 357/494 ms P-R-T axes 37 -32 122 Sinus tachycardia Ventricular trigeminy Left axis deviation Nonspecific repol abnormality, lateral leads  Assessment/Plan Principal Problem:   Cellulitis of both lower extremities Admit to telemetry/inpatient. Continue IV antibiotics. Continue analgesics as needed. Consult wound/ostomy care.  Active Problems:   Tachyarrhythmia Metoprolol 5 mg every 4 hours when necessary for tachycardia. Magnesium sulfate 2 g IVPB was given Continue bisoprolol 5 mg by mouth daily. Continue telemetry and monitor heart rate.    HTN (hypertension), malignant Continue amlodipine 10 mg by mouth daily. Continue bisoprolol 5 mg by mouth daily. Continue clonidine 0.2 mg by mouth bedtime. Monitor blood pressure.    CKD (chronic kidney disease) stage 3, GFR 30-59 ml/min (HCC) Monitor renal function and electrolytes.    Anxiety and depression Continue fluoxetine 20 mg by mouth daily. Continue trazodone 50 mg by mouth daily.    DM (diabetes mellitus), type 2, uncontrolled, with renal complications (HCC) Carbohydrate modified diet. Continue Prandin 1 mg by mouth 3 times a day before meals. Continue tradjenta 5 mg by mouth daily. CBG monitoring with regular insulin sliding scale.    Cerebral thrombosis with cerebral infarction Supportive care. Continue low-dose aspirin and Plavix.    COPD (chronic obstructive pulmonary disease) (HCC) Continue Singulair 10 mg by mouth at bedtime. Continue Spiriva inhaler daily. Dual  nebs every 6 hours as needed.    Hyperlipidemia Continue Zetia 10 mg by mouth daily. Continue Crestor 40 mg by mouth daily.    Anemia Anemia panel 3 years ago showed increased ferritin. Monitor hematocrit and hemoglobin.    Thrombocytosis ([place]) Continue antiplatelet therapy. Continue DVT prophylaxis. Monitor platelet count.  DVT prophylaxis: Heparin SQ. Code Status: Full code. Family Communication:  Disposition Plan: Admit for IV antibiotic therapy for 2-3 days. Consults called:  Admission status: Inpatient/telemetry.   Reubin Milan MD [facility] Pager [phone].  If 7PM-7AM, please contact night-coverage [email or web address] Password [email or web address]  08/02/2017, 11:39 PM

## 2017-08-02 NOTE — ED Triage Notes (Signed)
Reports of being discharged from rehab facility Saturday. Reports of blisters to bilateral lower legs while in rehab but worse since Saturday.

## 2017-08-02 NOTE — ED Notes (Signed)
Patient transported to X-ray 
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
## 2017-08-03 ENCOUNTER — Encounter (HOSPITAL_COMMUNITY): Payer: Self-pay

## 2017-08-03 DIAGNOSIS — L03115 Cellulitis of right lower limb: Principal | ICD-10-CM

## 2017-08-03 DIAGNOSIS — I6330 Cerebral infarction due to thrombosis of unspecified cerebral artery: Secondary | ICD-10-CM

## 2017-08-03 DIAGNOSIS — I10 Essential (primary) hypertension: Secondary | ICD-10-CM

## 2017-08-03 DIAGNOSIS — E1129 Type 2 diabetes mellitus with other diabetic kidney complication: Secondary | ICD-10-CM

## 2017-08-03 DIAGNOSIS — L03116 Cellulitis of left lower limb: Secondary | ICD-10-CM

## 2017-08-03 DIAGNOSIS — N183 Chronic kidney disease, stage 3 (moderate): Secondary | ICD-10-CM

## 2017-08-03 DIAGNOSIS — E1165 Type 2 diabetes mellitus with hyperglycemia: Secondary | ICD-10-CM

## 2017-08-03 DIAGNOSIS — F419 Anxiety disorder, unspecified: Secondary | ICD-10-CM

## 2017-08-03 DIAGNOSIS — F329 Major depressive disorder, single episode, unspecified: Secondary | ICD-10-CM

## 2017-08-03 DIAGNOSIS — I420 Dilated cardiomyopathy: Secondary | ICD-10-CM

## 2017-08-03 LAB — CBC WITH DIFFERENTIAL/PLATELET
Basophils Absolute: 0 10*3/uL (ref 0.0–0.1)
Basophils Relative: 0 %
Eosinophils Absolute: 0.1 10*3/uL (ref 0.0–0.7)
Eosinophils Relative: 1 %
HCT: 29.8 % — ABNORMAL LOW (ref 36.0–46.0)
Hemoglobin: 9.3 g/dL — ABNORMAL LOW (ref 12.0–15.0)
Lymphocytes Relative: 10 %
Lymphs Abs: 1.1 10*3/uL (ref 0.7–4.0)
MCH: 30.9 pg (ref 26.0–34.0)
MCHC: 31.2 g/dL (ref 30.0–36.0)
MCV: 99 fL (ref 78.0–100.0)
Monocytes Absolute: 0.7 10*3/uL (ref 0.1–1.0)
Monocytes Relative: 6 %
Neutro Abs: 8.6 10*3/uL — ABNORMAL HIGH (ref 1.7–7.7)
Neutrophils Relative %: 83 %
Platelets: 401 10*3/uL — ABNORMAL HIGH (ref 150–400)
RBC: 3.01 MIL/uL — ABNORMAL LOW (ref 3.87–5.11)
RDW: 15.1 % (ref 11.5–15.5)
WBC: 10.5 10*3/uL (ref 4.0–10.5)

## 2017-08-03 LAB — GLUCOSE, CAPILLARY
Glucose-Capillary: 96 mg/dL (ref 65–99)
Glucose-Capillary: 173 mg/dL — ABNORMAL HIGH (ref 65–99)
Glucose-Capillary: 132 mg/dL — ABNORMAL HIGH (ref 65–99)
Glucose-Capillary: 93 mg/dL (ref 65–99)

## 2017-08-03 LAB — COMPREHENSIVE METABOLIC PANEL
ALT: 17 U/L (ref 14–54)
AST: 18 U/L (ref 15–41)
Albumin: 2.3 g/dL — ABNORMAL LOW (ref 3.5–5.0)
Alkaline Phosphatase: 48 U/L (ref 38–126)
Anion gap: 12 (ref 5–15)
BUN: 20 mg/dL (ref 6–20)
CO2: 23 mmol/L (ref 22–32)
Calcium: 8.8 mg/dL — ABNORMAL LOW (ref 8.9–10.3)
Chloride: 106 mmol/L (ref 101–111)
Creatinine, Ser: 1.21 mg/dL — ABNORMAL HIGH (ref 0.44–1.00)
GFR calc Af Amer: 58 mL/min — ABNORMAL LOW (ref >60)
GFR calc non Af Amer: 50 mL/min — ABNORMAL LOW (ref >60)
Glucose, Bld: 118 mg/dL — ABNORMAL HIGH (ref 65–99)
Potassium: 3.5 mmol/L (ref 3.5–5.1)
Sodium: 141 mmol/L (ref 135–145)
Total Bilirubin: 0.8 mg/dL (ref 0.3–1.2)
Total Protein: 6.3 g/dL — ABNORMAL LOW (ref 6.5–8.1)

## 2017-08-03 LAB — RETICULOCYTES
RBC.: 3.17 MIL/uL — ABNORMAL LOW (ref 3.87–5.11)
Retic Count, Absolute: 72.9 10*3/uL (ref 19.0–186.0)
Retic Ct Pct: 2.3 % (ref 0.4–3.1)

## 2017-08-03 LAB — MAGNESIUM: Magnesium: 1.7 mg/dL (ref 1.7–2.4)

## 2017-08-03 MED ORDER — LINAGLIPTIN 5 MG PO TABS
5.0000 mg | ORAL_TABLET | Freq: Every day | ORAL | Status: DC
  Administered 2017-08-03: 5 mg via ORAL
  Filled 2017-08-03: qty 1

## 2017-08-03 MED ORDER — COLLAGENASE 250 UNIT/GM EX OINT
TOPICAL_OINTMENT | Freq: Every day | CUTANEOUS | Status: DC
  Administered 2017-08-03 – 2017-08-06 (×4): via TOPICAL
  Filled 2017-08-03: qty 30

## 2017-08-03 MED ORDER — HEPARIN SODIUM (PORCINE) 5000 UNIT/ML IJ SOLN
5000.0000 [IU] | Freq: Three times a day (TID) | INTRAMUSCULAR | Status: DC
  Administered 2017-08-03 – 2017-08-06 (×10): 5000 [IU] via SUBCUTANEOUS
  Filled 2017-08-03 (×10): qty 1

## 2017-08-03 MED ORDER — BISOPROLOL FUMARATE 5 MG PO TABS: 10.0000 mg | ORAL_TABLET | Freq: Every day | ORAL | Status: DC

## 2017-08-03 MED ORDER — METOPROLOL TARTRATE 5 MG/5ML IV SOLN
5.0000 mg | Freq: Once | INTRAVENOUS | Status: AC
  Administered 2017-08-03: 5 mg via INTRAVENOUS
  Filled 2017-08-03: qty 5

## 2017-08-03 MED ORDER — AMLODIPINE BESYLATE 5 MG PO TABS
10.0000 mg | ORAL_TABLET | Freq: Every day | ORAL | Status: DC
  Administered 2017-08-03: 10 mg via ORAL
  Filled 2017-08-03: qty 2

## 2017-08-03 MED ORDER — ONDANSETRON HCL 4 MG/2ML IJ SOLN: 4.0000 mg | Freq: Four times a day (QID) | INTRAMUSCULAR | Status: DC | PRN

## 2017-08-03 MED ORDER — EZETIMIBE 10 MG PO TABS
10.0000 mg | ORAL_TABLET | Freq: Every day | ORAL | Status: DC
  Administered 2017-08-03 – 2017-08-06 (×4): 10 mg via ORAL
  Filled 2017-08-03 (×4): qty 1

## 2017-08-03 MED ORDER — INSULIN ASPART 100 UNIT/ML ~~LOC~~ SOLN
0.0000 [IU] | Freq: Three times a day (TID) | SUBCUTANEOUS | Status: DC
  Administered 2017-08-03: 2 [IU] via SUBCUTANEOUS
  Administered 2017-08-03: 1 [IU] via SUBCUTANEOUS
  Administered 2017-08-04: 2 [IU] via SUBCUTANEOUS

## 2017-08-03 MED ORDER — FLUOXETINE HCL 20 MG PO CAPS
20.0000 mg | ORAL_CAPSULE | Freq: Every day | ORAL | Status: DC
  Administered 2017-08-03 – 2017-08-06 (×4): 20 mg via ORAL
  Filled 2017-08-03 (×4): qty 1

## 2017-08-03 MED ORDER — OXYCODONE-ACETAMINOPHEN 5-325 MG PO TABS
1.0000 | ORAL_TABLET | Freq: Four times a day (QID) | ORAL | Status: DC | PRN
  Administered 2017-08-03 – 2017-08-06 (×9): 1 via ORAL
  Filled 2017-08-03 (×9): qty 1

## 2017-08-03 MED ORDER — METOPROLOL TARTRATE 5 MG/5ML IV SOLN
5.0000 mg | INTRAVENOUS | Status: DC | PRN
  Administered 2017-08-04: 5 mg via INTRAVENOUS
  Filled 2017-08-03: qty 5

## 2017-08-03 MED ORDER — MONTELUKAST SODIUM 10 MG PO TABS
10.0000 mg | ORAL_TABLET | Freq: Every day | ORAL | Status: DC
  Administered 2017-08-03 – 2017-08-05 (×4): 10 mg via ORAL
  Filled 2017-08-03 (×4): qty 1

## 2017-08-03 MED ORDER — PANTOPRAZOLE SODIUM 40 MG PO TBEC
40.0000 mg | DELAYED_RELEASE_TABLET | Freq: Every day | ORAL | Status: DC
  Administered 2017-08-03 – 2017-08-06 (×4): 40 mg via ORAL
  Filled 2017-08-03 (×4): qty 1

## 2017-08-03 MED ORDER — CLONIDINE HCL 0.2 MG PO TABS
0.2000 mg | ORAL_TABLET | Freq: Every day | ORAL | Status: DC
  Administered 2017-08-03 – 2017-08-05 (×4): 0.2 mg via ORAL
  Filled 2017-08-03 (×4): qty 1

## 2017-08-03 MED ORDER — POTASSIUM CHLORIDE CRYS ER 20 MEQ PO TBCR
20.0000 meq | EXTENDED_RELEASE_TABLET | Freq: Once | ORAL | Status: AC
  Administered 2017-08-03: 20 meq via ORAL
  Filled 2017-08-03: qty 1

## 2017-08-03 MED ORDER — TIOTROPIUM BROMIDE MONOHYDRATE 18 MCG IN CAPS
18.0000 ug | ORAL_CAPSULE | Freq: Every day | RESPIRATORY_TRACT | Status: DC
  Administered 2017-08-04 – 2017-08-06 (×3): 18 ug via RESPIRATORY_TRACT
  Filled 2017-08-03: qty 5

## 2017-08-03 MED ORDER — ALBUTEROL SULFATE (2.5 MG/3ML) 0.083% IN NEBU
2.5000 mg | INHALATION_SOLUTION | Freq: Three times a day (TID) | RESPIRATORY_TRACT | Status: DC
  Administered 2017-08-03 – 2017-08-06 (×9): 2.5 mg via RESPIRATORY_TRACT
  Filled 2017-08-03 (×9): qty 3

## 2017-08-03 MED ORDER — BISOPROLOL FUMARATE 5 MG PO TABS
5.0000 mg | ORAL_TABLET | Freq: Every day | ORAL | Status: DC
  Administered 2017-08-03: 5 mg via ORAL
  Filled 2017-08-03: qty 1

## 2017-08-03 MED ORDER — GABAPENTIN 100 MG PO CAPS
100.0000 mg | ORAL_CAPSULE | Freq: Three times a day (TID) | ORAL | Status: DC
  Administered 2017-08-03 – 2017-08-06 (×11): 100 mg via ORAL
  Filled 2017-08-03 (×11): qty 1

## 2017-08-03 MED ORDER — POTASSIUM CHLORIDE CRYS ER 20 MEQ PO TBCR
20.0000 meq | EXTENDED_RELEASE_TABLET | Freq: Every day | ORAL | Status: DC
  Administered 2017-08-03 – 2017-08-06 (×4): 20 meq via ORAL
  Filled 2017-08-03 (×4): qty 1

## 2017-08-03 MED ORDER — REPAGLINIDE 0.5 MG PO TABS
1.0000 mg | ORAL_TABLET | Freq: Three times a day (TID) | ORAL | Status: DC
  Administered 2017-08-03: 1 mg via ORAL
  Filled 2017-08-03 (×5): qty 1

## 2017-08-03 MED ORDER — TRAZODONE HCL 50 MG PO TABS
50.0000 mg | ORAL_TABLET | Freq: Every day | ORAL | Status: DC
  Administered 2017-08-03 – 2017-08-05 (×4): 50 mg via ORAL
  Filled 2017-08-03 (×4): qty 1

## 2017-08-03 MED ORDER — CARVEDILOL 3.125 MG PO TABS
6.2500 mg | ORAL_TABLET | Freq: Two times a day (BID) | ORAL | Status: DC
  Filled 2017-08-03: qty 2

## 2017-08-03 MED ORDER — LISINOPRIL 5 MG PO TABS
5.0000 mg | ORAL_TABLET | Freq: Every day | ORAL | Status: DC
  Administered 2017-08-04 – 2017-08-06 (×3): 5 mg via ORAL
  Filled 2017-08-03 (×3): qty 1

## 2017-08-03 MED ORDER — IPRATROPIUM-ALBUTEROL 0.5-2.5 (3) MG/3ML IN SOLN
3.0000 mL | Freq: Four times a day (QID) | RESPIRATORY_TRACT | Status: DC | PRN
  Administered 2017-08-03 – 2017-08-04 (×3): 3 mL via RESPIRATORY_TRACT
  Filled 2017-08-03 (×3): qty 3

## 2017-08-03 MED ORDER — ASPIRIN EC 81 MG PO TBEC
81.0000 mg | DELAYED_RELEASE_TABLET | Freq: Every day | ORAL | Status: DC
  Administered 2017-08-03 – 2017-08-06 (×4): 81 mg via ORAL
  Filled 2017-08-03 (×6): qty 1

## 2017-08-03 MED ORDER — GABAPENTIN 100 MG PO CAPS: 100.0000 mg | ORAL_CAPSULE | Freq: Three times a day (TID) | ORAL | Status: DC

## 2017-08-03 MED ORDER — DEXTROSE 5 % IV SOLN
1.0000 g | INTRAVENOUS | Status: DC
  Administered 2017-08-03 – 2017-08-04 (×2): 1 g via INTRAVENOUS
  Filled 2017-08-03 (×4): qty 10

## 2017-08-03 MED ORDER — SACUBITRIL-VALSARTAN 24-26 MG PO TABS
1.0000 | ORAL_TABLET | Freq: Two times a day (BID) | ORAL | Status: DC
  Filled 2017-08-03 (×2): qty 1

## 2017-08-03 MED ORDER — FLUTICASONE PROPIONATE 50 MCG/ACT NA SUSP
2.0000 | Freq: Every day | NASAL | Status: DC
  Administered 2017-08-03 – 2017-08-05 (×3): 2 via NASAL
  Filled 2017-08-03 (×2): qty 16

## 2017-08-03 MED ORDER — ONDANSETRON HCL 4 MG PO TABS: 4.0000 mg | ORAL_TABLET | Freq: Four times a day (QID) | ORAL | Status: DC | PRN

## 2017-08-03 MED ORDER — FUROSEMIDE 20 MG PO TABS
20.0000 mg | ORAL_TABLET | Freq: Every day | ORAL | Status: DC
  Administered 2017-08-04 – 2017-08-06 (×3): 20 mg via ORAL
  Filled 2017-08-03 (×3): qty 1

## 2017-08-03 MED ORDER — MAGNESIUM SULFATE 2 GM/50ML IV SOLN
2.0000 g | Freq: Once | INTRAVENOUS | Status: AC
  Administered 2017-08-03: 2 g via INTRAVENOUS
  Filled 2017-08-03: qty 50

## 2017-08-03 MED ORDER — CLOPIDOGREL BISULFATE 75 MG PO TABS
75.0000 mg | ORAL_TABLET | Freq: Every day | ORAL | Status: DC
  Administered 2017-08-03 – 2017-08-06 (×4): 75 mg via ORAL
  Filled 2017-08-03 (×5): qty 1

## 2017-08-03 NOTE — Consult Note (Signed)
   [facility] Inpatient Consult   08/03/2017  [name] 11-04-1965 [id number]   Patient is currently active with [facility] for chronic disease management services.  Patient has been engaged by [facility] Clinical Social Worker while at recent SNF stay. Before visiting patient made a collaboration call to [facility] CSW to ask if there was any needed information to communicate with patient. She requested the patient's current phone number for outreach. Visited patient at the bedside and made her aware [facility] will continue to follow her at discharge. She was grateful. Patient stated she was unsure where she would be going at discharge, but she was hoping to go back to a SNF just not the one where she had previously stayed. She stated if she did not go to a SNF she plans to go to her son's [name]) home. She stated his home number is [phone] and his address is [address].  Our community based plan of care has focused on disease management and community resource support.  Patient will receive a post discharge transition of care call and will be evaluated for monthly home visits for assessments and disease process education.  Made Inpatient Case Manager aware that [facility] following. Of note, [facility] Care Management services does not replace or interfere with any services that are needed or arranged by inpatient case management or social work.  For additional questions or referrals please contact:  Yavier Snider RN, [facility] Liaison  [phone]) [place] [phone]) Toll free office

## 2017-08-03 NOTE — Progress Notes (Signed)
[facility]  PROGRESS NOTE  [name] XNA:[id number] DOB: [birth year] DOA: 08/02/2017 PCP: [name], MD   Brief HPI:   51 y.o. female with medical history significant of alcohol use, previous tobacco, cocaine and cannabis use, anxiety, panic attacks, depression, history of left breast mass, chronic kidney disease, COPD, type 2 diabetes, diabetic Charcot foot, diabetic neuropathy, essential hypertension, history of GI bleed, hyperlipidemia, recent thalamic CVA who just came home on Saturday from rehabilitation and comes to the emergency department reporting of worsening of bilateral lower extremities blisters Her right foot x-ray showsExtensive soft tissue swelling and pes planus with midfoot degenerative changes. Nondisplaced fracture at base of proximal phalanx RIGHT third toe.   Subjective   Patient complains of pain in both lower extremities.   Assessment/Plan:     1. Cellulitis of both lower extremities-continue ceftriaxone, wound care consulted. We'll start Percocet when necessary for pain control. 2. Tachyarrhythmia- EKG showed atrial tachycardia, continue Bisoprolol 5 mg po daily, continue monitoring on telemetry. 3. Hypertension- continue amlodipine, bisoprolol, clonidine. 4. CKD stage 3- creatinine is stable at 1.21 5. Diabetes mellitus type 2 with renal complications-continue carbohydrate modified diet, will hold Prandin and Tradjenta, continue with sliding scale insulin with NovoLog. 6. Cerebral thrombosis with cerebral infarction-stable, continue low-dose aspirin and Plavix 7. COPD-stable continue DuoNeb's every 6 hours when necessary, Spiriva, Singulair 8. Chronic anemia-hemoglobin is 10.4, down from baseline of 12-13 from August. Will check anemia panel. Stool for occult blood 9.     DVT prophylaxis: Heparin  Code Status: Full code  Family Communication: No family present at bedside, discussed with patient   Disposition Plan: Likely home in  next 2-3 days   Consultants:  None  Procedures:  None  Continuous infusions . cefTRIAXone (ROCEPHIN)  IV Stopped (08/03/17 0205)      Antibiotics:   Anti-infectives    Start     Dose/Rate Route Frequency Ordered Stop   08/03/17 0130  cefTRIAXone (ROCEPHIN) 1 g in dextrose 5 % 50 mL IVPB     1 g 100 mL/hr over 30 Minutes Intravenous Every 24 hours 08/03/17 0112     08/02/17 2115  amoxicillin-clavulanate (AUGMENTIN) 875-125 MG per tablet 1 tablet     1 tablet Oral  Once 08/02/17 2110 08/02/17 2138   08/02/17 2115  doxycycline (VIBRA-TABS) tablet 100 mg     100 mg Oral  Once 08/02/17 2110 08/02/17 2138       Objective   Vitals:   08/03/17 0030 08/03/17 0057 08/03/17 0125 08/03/17 0220  BP: (!) 160/113  (!) 163/117   Pulse: 86 89 91   Resp: 20 17 18    Temp:   98.7 F (37.1 C)   TempSrc:   Oral   SpO2: 97% 92% 99% 95%  Weight:   60.3 kg (133 lb)   Height:   5\' 1"  (1.549 m)    No intake or output data in the 24 hours ending 08/03/17 1143 Filed Weights   08/02/17 1738 08/03/17 0125  Weight: 60.3 kg (133 lb) 60.3 kg (133 lb)     Physical Examination:   Physical Exam: Eyes: No icterus, extraocular muscles intact  Mouth: Oral mucosa is moist, no lesions on palate,  Neck: Supple, no deformities, masses, or tenderness Lungs: Normal respiratory effort, bilateral clear to auscultation, no crackles or wheezes.  Heart: Regular rate and rhythm, S1 and S2 normal, no murmurs, rubs auscultated Abdomen: BS normoactive,soft,nondistended,non-tender to palpation,no organomegaly Extremities: No pretibial edema, no erythema, no cyanosis, no  clubbing Neuro : Alert and oriented to time, place and person, No focal deficits  Skin: Bilateral lower extremities covered in dressing     Data Reviewed: I have personally reviewed following labs and imaging studies  CBG:  Recent Labs Lab 08/02/17 1744 08/03/17 0755 08/03/17 1132  GLUCAP 104* 96 173*    CBC:  Recent  Labs Lab 08/02/17 2108 08/03/17 0450  WBC 13.0* 10.5  NEUTROABS 10.6* 8.6*  HGB 10.4* 9.3*  HCT 33.9* 29.8*  MCV 99.4 99.0  PLT 465* 401*    Basic Metabolic Panel:  Recent Labs Lab 08/02/17 2108 08/03/17 0450  NA 144 141  K 3.9 3.5  CL 108 106  CO2 24 23  GLUCOSE 95 118*  BUN 21* 20  CREATININE 1.26* 1.21*  CALCIUM 9.5 8.8*  MG 1.7  --     No results found for this or any previous visit (from the past 240 hour(s)).   Liver Function Tests:  Recent Labs Lab 08/03/17 0450  AST 18  ALT 17  ALKPHOS 48  BILITOT 0.8  PROT 6.3*  ALBUMIN 2.3*   No results for input(s): LIPASE, AMYLASE in the last 168 hours. No results for input(s): AMMONIA in the last 168 hours.  Cardiac Enzymes: No results for input(s): CKTOTAL, CKMB, CKMBINDEX, TROPONINI in the last 168 hours. BNP (last 3 results)  Recent Labs  06/21/17 1637 06/22/17 0337  BNP 1,152.0* 835.0*    ProBNP (last 3 results) No results for input(s): PROBNP in the last 8760 hours.    Studies: Dg Foot Complete Right  Result Date: 08/02/2017 CLINICAL DATA:  Multiple wounds on her legs, RIGHT foot has begun to have foul smell when home health nurse came to change dressings today EXAM: RIGHT FOOT COMPLETE - 3+ VIEW COMPARISON:  11/11/2009 FINDINGS: Mild diffuse osseous demineralization. Scattered soft tissue swelling. Pes planus. Small plantar calcaneal spur. Ossicle at dorsal margin of the tarsal navicular, appears corticated and old. Nondisplaced fracture identified at base of proximal phalanx third toe. No additional fracture, dislocation, or bone destruction. IMPRESSION: Extensive soft tissue swelling and pes planus with midfoot degenerative changes. Nondisplaced fracture at base of proximal phalanx RIGHT third toe. Electronically Signed   By: Lavonia Dana M.D.   On: 08/02/2017 23:40    Scheduled Meds: . amLODipine  10 mg Oral Daily  . aspirin EC  81 mg Oral Daily  . bisoprolol  5 mg Oral Daily  . cloNIDine   0.2 mg Oral QHS  . clopidogrel  75 mg Oral Q breakfast  . collagenase   Topical Daily  . ezetimibe  10 mg Oral Daily  . FLUoxetine  20 mg Oral Daily  . gabapentin  100 mg Oral TID  . heparin  5,000 Units Subcutaneous Q8H  . insulin aspart  0-9 Units Subcutaneous TID WC  . linagliptin  5 mg Oral Daily  . montelukast  10 mg Oral QHS  . pantoprazole  40 mg Oral Daily  . potassium chloride SA  20 mEq Oral Daily  . repaglinide  1 mg Oral TID AC  . tiotropium  18 mcg Inhalation Daily  . traZODone  50 mg Oral QHS      Time spent: 25 min  [facility] Pager [phone]. If 7PM-7AM, please contact night-coverage at [email or web address], Office  [phone]  password [place]  08/03/2017, 11:43 AM  LOS: 1 day

## 2017-08-03 NOTE — Progress Notes (Signed)
Called to pt's room for pt in distress. PT on room air and would like a PRN nebulizer treatement. Treatment given.Marland Kitchen

## 2017-08-03 NOTE — Consult Note (Addendum)
[facility] Nurse wound consult note Reason for Consult: Consult requested for leg wounds.  Consult was completed after review of photos of BLE wounds included in the EMR, and a review of the H&P. Pt has a history of cocaine use, according to the progress notes.  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 Wound type: Left foot with previous bulla which has ruptured and evolved into a partial thickness wound with the skin intact. Bilat legs with multiple full thickness wounds; right foot is 100% yellow slough, right posterior legs with multiple areas of eschar.   Dressing procedure/placement/frequency: Xeroform gauze to promote healing of blistered foot.  Santyl ointment for enzymatic debridement of nonviable tissue to multiple full thickness wounds on legs.  Pt could benefit from continued follow-up and sharp debridement at the outpatient wound care center; please order if desired. Please re-consult if further assistance is needed.  Thank-you,  Julien Girt MSN, [place], [place], [place], [place]

## 2017-08-03 NOTE — Consult Note (Signed)
Cardiology Consultation:   Patient ID: EVORA SCHECHTER; [id number]; 09/14/1966   Admit date: 08/02/2017 Date of Consult: 08/03/2017  Primary Care Provider: [name], MD Primary Cardiologist: Kate Sable MD  Patient Profile:   [name] is a 51 y.o. female with a hx of hypertensive cardiomyopathy, uncontrolled hypertension, recent thalamic CVA, with OP rehab, CKD, Type II diabetes, ETOH abuse, anxiety, depression, panic attacks, hx of GIB , and COPD who is being seen today for the evaluation of atrial tachycardia, bilateral LEE at the request of Dr. Darrick Meigs   History of Present Illness:   Ms. Gunnels presented to the ER with complaints of bilateral LE blisters and bilateral LEE. She was admitted for treatment of cellulitis of of bilateral LE. During admission patient was found to have rapid H\R. She was started on bisoprolol, with IV lopressor prn. She remains on IV Rocephin and Augmentin, for cellulitis. ]  She states that blisters and edema began while at rehab facility and were treated by physician there. She came home on a Saturday and did not take her medications for two days. She did not have them filled until the day she was admitted.   She has had worsening dyspnea and has felt her HR increase over the last few days. She is in a lot of pain with the bilateral LE wounds.   In ER Blood pressure was 159/101, heart rate 118 bpm, O2 sat 100%, she was afebrile. Lactic acid 2.7. Review of chemistries revealed elevated BUN 21 creatinine 1.26. White blood cells were 13.0, hemoglobin 10.4 hematocrit 33.9, platelets 465 Magnesium 1.7. EKG revealed atrial tachycardia, hard 110 bpm. Subsequent telemetry reveals sinus tachycardia with salvos of SVT/atrial tachycardia.   Past Medical History:  Diagnosis Date  . Alcohol use   . Ankle fracture, lateral malleolus, closed 12/30/2011  . Anxiety   . Breast mass, left 12/15/2011  . CKD (chronic kidney disease)   . COPD (chronic  obstructive pulmonary disease) ([place])   . Diabetes mellitus, type 2 ([place])   . Diabetic Charcot foot ([place]) 12/30/2011  . Essential hypertension   . History of GI bleed 12/05/2009   Qualifier: Diagnosis of  By: Zeb Comfort    . Hyperlipidemia   . Hypertensive cardiomyopathy ([place]) 11/08/2012   a. 05/2016: echo showing EF of 20-25% with normal cors by cath in 07/2016.  Marland Kitchen Noncompliance with medication regimen   . Obesity   . Panic attacks   . Tobacco abuse     Past Surgical History:  Procedure Laterality Date  . BREAST BIOPSY    . CARDIAC CATHETERIZATION N/A 07/28/2016   Procedure: Left Heart Cath and Coronary Angiography;  Surgeon: Jettie Booze, MD;  Location: [facility];  Service: Cardiovascular;  Laterality: N/A;  . DILATION AND CURETTAGE OF UTERUS       Home Medications:  Prior to Admission medications   Medication Sig Start Date End Date Taking? Authorizing Provider  amLODipine (NORVASC) 10 MG tablet TAKE 1 TABLET BY MOUTH ONCE A DAY. 02/01/17  Yes [name], MD  aspirin EC 81 MG EC tablet Take 1 tablet (81 mg total) by mouth daily. 07/01/17  Yes Mikhail, Maryann, DO  atorvastatin (LIPITOR) 80 MG tablet Take 80 mg by mouth daily.   Yes [provider]  Azelastine HCl (ASTEPRO) 0.15 % SOLN Place 2 sprays into the nose every 12 (twelve) hours as needed (for allergies).   Yes [provider]  bisoprolol (ZEBETA) 5 MG tablet  Take 1 tablet (5 mg total) by mouth daily. 04/26/17  Yes [name], MD  cloNIDine (CATAPRES) 0.2 MG tablet TAKE (1) TABLET BY MOUTH AT BEDTIME FOR BLOOD PRESSURE. 11/30/16  Yes [name], MD  clopidogrel (PLAVIX) 75 MG tablet TAKE 1 TABLET BY MOUTH DAILY WITH BREAKFAST. 06/01/17  Yes [name], MD  collagenase (SANTYL) ointment Apply 1 application topically daily. To affected area   Yes [provider]  ezetimibe (ZETIA) 10 MG tablet Take 1 tablet (10 mg total) by mouth daily. 04/26/17  Yes  [name], MD  fenofibrate micronized (LOFIBRA) 200 MG capsule Take 200 mg by mouth daily before breakfast.   Yes [provider]  FLUoxetine (PROZAC) 20 MG capsule TAKE 1 CAPSULE BY MOUTH EVERY DAY. 06/01/17  Yes [name], MD  fluticasone (FLONASE) 50 MCG/ACT nasal spray Place 2 sprays into both nostrils daily. 02/08/17  Yes [name], MD  folic acid (FOLVITE) 1 MG tablet Take 1 tablet (1 mg total) by mouth daily. 07/01/17  Yes Mikhail, Maryann, DO  furosemide (LASIX) 20 MG tablet TAKE 1 TABLET BY MOUTH DAILY. 06/29/17  Yes Herminio Commons, MD  gabapentin (NEURONTIN) 100 MG capsule TAKE 1 CAPSULE BY MOUTH THREE TIMES A DAY. 06/01/17  Yes [name], MD  hydrocortisone valerate cream (WESTCORT) 0.2 % Apply topically 2 (two) times daily. 06/30/17  Yes Mikhail, Velta Addison, DO  Insulin Glargine (LANTUS SOLOSTAR) 100 UNIT/ML Solostar Pen Inject 10 Units into the skin daily at 10 pm.   Yes [provider]  montelukast (SINGULAIR) 10 MG tablet TAKE (1) TABLET BY MOUTH AT BEDTIME. 06/01/17  Yes [name], MD  Multiple Vitamin (MULTIVITAMIN WITH MINERALS) TABS tablet Take 1 tablet by mouth daily. 07/01/17  Yes Mikhail, Maryann, DO  pantoprazole (PROTONIX) 40 MG tablet Take 1 tablet (40 mg total) by mouth daily. 04/26/17  Yes [name], MD  potassium chloride SA (K-DUR,KLOR-CON) 20 MEQ tablet TAKE 1&1/2 TABLET BY MOUTH DAILY. 06/01/17  Yes [name], MD  PROAIR HFA 108 [phone] Base) MCG/ACT inhaler INHALE 2 PUFFS EVERY 6 HOURS AS NEEDED FOR SHORTNESS OF BREATH/WHEEZING. 06/01/17  Yes [name], MD  repaglinide (PRANDIN) 1 MG tablet TAKE 1 TABLET BY MOUTH 3 TIMES DAILY BEFORE MEALS. Patient taking differently: Take 0.5 mg by mouth 3 (three) times daily before meals.  11/30/16  Yes [name], MD  thiamine 100 MG tablet Take 1 tablet (100 mg total) by mouth daily. 07/01/17  Yes Mikhail, Maryann, DO  TRADJENTA 5 MG TABS tablet TAKE  1 TABLET BY MOUTH ONCE A DAY. 06/01/17  Yes [name], MD  traZODone (DESYREL) 50 MG tablet TAKE (1) TABLET BY MOUTH AT BEDTIME. 06/01/17  Yes [name], MD  Vitamin D, Ergocalciferol, (DRISDOL) 50000 units CAPS capsule Take 1 capsule (50,000 Units total) by mouth once a week. 03/02/17  Yes [name], MD  Choline Fenofibrate 135 MG capsule TAKE 1 CAPSULE BY MOUTH EVERY DAY. Patient not taking: Reported on 08/03/2017 06/01/17   [name], MD  insulin aspart (NOVOLOG) 100 UNIT/ML injection Inject 0-9 Units into the skin 3 (three) times daily with meals. 06/30/17   Mikhail, Velta Addison, DO  ipratropium-albuterol (DUONEB) 0.5-2.5 (3) MG/3ML SOLN Take 3 mLs by nebulization every 6 (six) hours as needed. Patient not taking: Reported on 08/03/2017 03/10/16   [name], MD    Inpatient Medications: Scheduled Meds: . albuterol  2.5  mg Nebulization TID  . amLODipine  10 mg Oral Daily  . aspirin EC  81 mg Oral Daily  . bisoprolol  5 mg Oral Daily  . cloNIDine  0.2 mg Oral QHS  . clopidogrel  75 mg Oral Q breakfast  . collagenase   Topical Daily  . ezetimibe  10 mg Oral Daily  . FLUoxetine  20 mg Oral Daily  . gabapentin  100 mg Oral TID  . heparin  5,000 Units Subcutaneous Q8H  . insulin aspart  0-9 Units Subcutaneous TID WC  . montelukast  10 mg Oral QHS  . pantoprazole  40 mg Oral Daily  . potassium chloride SA  20 mEq Oral Daily  . tiotropium  18 mcg Inhalation Daily  . traZODone  50 mg Oral QHS   Continuous Infusions: . cefTRIAXone (ROCEPHIN)  IV Stopped (08/03/17 0205)   PRN Meds: ipratropium-albuterol, metoprolol tartrate, ondansetron **OR** ondansetron (ZOFRAN) IV, oxyCODONE-acetaminophen  Allergies:   No Known Allergies  Social History:   Social History   Social History  . Marital status: Widowed    Spouse name: N/A  . Number of children: 2  . Years of education: N/A   Occupational History  . Not on file.   Social History Main Topics  .  Smoking status: Current Every Day Smoker    Packs/day: 1.00    Years: 20.00    Types: Cigarettes    Last attempt to quit: 06/03/2016  . Smokeless tobacco: Never Used  . Alcohol use No     Comment: Quit in 2017  . Drug use: Yes    Frequency: 3.0 times per week    Types: Marijuana, Cocaine  . Sexual activity: Not Currently    Birth control/ protection: None   Other Topics Concern  . Not on file   Social History Narrative   Currently living in a motel. Stopped taking all her medicines because she was "fed up"    Family History:    Family History  Problem Relation Age of Onset  . Hypertension Mother   . Heart attack Mother   . Hypertension Father        CABG   . Hypertension Sister   . Hypertension Brother   . Hypertension Sister   . Cancer Sister        breast   . Arthritis Unknown   . Cancer Unknown   . Diabetes Unknown   . Asthma Unknown   . Hypertension Daughter   . Hypertension Son      ROS:  Please see the history of present illness.  ROS  All other ROS reviewed and negative.     Physical Exam/Data:   Vitals:   08/03/17 0057 08/03/17 0125 08/03/17 0220 08/03/17 1344  BP:  (!) 163/117    Pulse: 89 91    Resp: 17 18    Temp:  98.7 F (37.1 C)    TempSrc:  Oral    SpO2: 92% 99% 95% 94%  Weight:  133 lb (60.3 kg)    Height:  5\' 1"  (1.549 m)     No intake or output data in the 24 hours ending 08/03/17 1523 Filed Weights   08/02/17 1738 08/03/17 0125  Weight: 133 lb (60.3 kg) 133 lb (60.3 kg)   Body mass index is 25.13 kg/m.  General:  Well nourished, well developed, Ill appearing, complaining of lower extremity pain.  HEENT: normal Lymph: no adenopathy Neck: no JVD Endocrine:  No thryomegaly Vascular: No carotid bruits;  Cardiac:  normal S1, S2; RRR, 1/6 systolic murmur occasionally irregular Lungs:  Clear to auscultation bilaterally, no wheezing, rhonchi or rales  Abd: soft, nontender, no hepatomegaly  Ext: No significant edema, mild puffiness  is noted around wounds.  Musculoskeletal: Bilateral lower extremity wounds that are dressed, painful to palpation, no weeping is noted through the dressings. Was unable to palpate pulses  Skin: warm and dry  Neuro:  CNs 2-12 intact, no focal abnormalities noted Psych:  Normal affect   EKG:  The EKG was personally reviewed and demonstrates: Atrial tachycardia with LVH, HR 110 bpm.  Telemetry:  Telemetry was personally reviewed and demonstrates:Sinus rhythm, sinus tachycardia, salvos of atrial tachycardia    Relevant CV Studies: Echocardiogram 06/22/2017 Left ventricle: The cavity size was normal. Wall thickness was   increased in a pattern of moderate LVH. Systolic function was   moderately to severely reduced. The estimated ejection fraction   was in the range of 30% to 35%. Diffuse hypokinesis. Features are   consistent with a pseudonormal left ventricular filling pattern,   with concomitant abnormal relaxation and increased filling   pressure (grade 2 diastolic dysfunction). - Mitral valve: There was moderate regurgitation directed   posteriorly. - Left atrium: The atrium was mildly dilated.  Laboratory Data:  Chemistry Recent Labs Lab 08/02/17 2108 08/03/17 0450  NA 144 141  K 3.9 3.5  CL 108 106  CO2 24 23  GLUCOSE 95 118*  BUN 21* 20  CREATININE 1.26* 1.21*  CALCIUM 9.5 8.8*  GFRNONAA 48* 50*  GFRAA 55* 58*  ANIONGAP 12 12     Recent Labs Lab 08/03/17 0450  PROT 6.3*  ALBUMIN 2.3*  AST 18  ALT 17  ALKPHOS 48  BILITOT 0.8   Hematology Recent Labs Lab 08/02/17 2108 08/03/17 0450 08/03/17 1437  WBC 13.0* 10.5  --   RBC 3.41* 3.01* 3.17*  HGB 10.4* 9.3*  --   HCT 33.9* 29.8*  --   MCV 99.4 99.0  --   MCH 30.5 30.9  --   MCHC 30.7 31.2  --   RDW 15.1 15.1  --   PLT 465* 401*  --    Cardiac EnzymesNo results for input(s): TROPONINI in the last 168 hours. No results for input(s): TROPIPOC in the last 168 hours.  BNPNo results for input(s): BNP, PROBNP  in the last 168 hours.  DDimer No results for input(s): DDIMER in the last 168 hours.  Radiology/Studies:  Dg Foot Complete Right  Result Date: 08/02/2017 CLINICAL DATA:  Multiple wounds on her legs, RIGHT foot has begun to have foul smell when home health nurse came to change dressings today EXAM: RIGHT FOOT COMPLETE - 3+ VIEW COMPARISON:  11/11/2009 FINDINGS: Mild diffuse osseous demineralization. Scattered soft tissue swelling. Pes planus. Small plantar calcaneal spur. Ossicle at dorsal margin of the tarsal navicular, appears corticated and old. Nondisplaced fracture identified at base of proximal phalanx third toe. No additional fracture, dislocation, or bone destruction. IMPRESSION: Extensive soft tissue swelling and pes planus with midfoot degenerative changes. Nondisplaced fracture at base of proximal phalanx RIGHT third toe. Electronically Signed   By: Lavonia Dana M.D.   On: 08/02/2017 23:40    Assessment and Plan:   1. Atrial tachycardia: Multifactorial in the setting of cellulitis, pain, hypertension. Agree with beta blocker therapy. She had been without her medications for 2 days prior to coming in as she was unable to get her prescriptions filled. At home she is on bisoprolol 5 mg  daily along clonidine 0.2 mg at at bedtime. This has been continued during hospitalization. Would use IV metoprolol when necessary as you're doing. I will increase bisoprolol to 10 mg daily from 5 mg daily. With reduced ejection fraction, heart rate control is imperative.  2. Acute on chronic mixed CHF: Echocardiogram completed August 2018 revealed reduced EF of 30-35% with diffuse hypokinesis and grade 2 diastolic dysfunction. Chest x-ray did not reveal CHF. Lower extremity edema likely related to infective process. Review of home medications has round Lasix 20 mg daily. This has not yet been restarted during hospitalization. Creatinine 1.21. Would restart by mouth Lasix.   3. Bilateral lower extremity  cellulitis: Followed by PCP team with antibiotic therapy.  4. Hypertension: Blood pressure is not optimal for significantly reduced ejection fraction. Increasing bisoprolol. Reinstituting Lasix 20 mg daily. She is currently on amlodipine 10 mg daily. Would discontinue and begin Entresto 24/26 mg daily. Follow-up with [facility] in a.m. Consider adding spironolactone if kidney function allows.   For questions or updates, please contact [place] Please consult [email or web address] for contact info under Cardiology/STEMI.   Signed, Jory Sims, NP  08/03/2017 3:23 PM   Patient examined chart reviewed. Chronically ill black female with non ischemic DCM No CAD by recent cath. Recently d/c from chronic care facility but sister who works at [facility] thought she still had open wounds on her legs and sent her to [facility] She has not been compliant with her meds. She has history of drug and alcohol abuse including cocaine She says she has not smoked, consumed alcohol , drugs in 4 weeks. Chronic dyspnea and more palpitations lately No chest pain Legs hurt Exam with chronically ill talkative black female Lungs clear no murmur multiple open wounds on LE 's with tegaderm on dorsum of both feet. Unable to palpate DP/PT on either foot due to edema and dressing  Telemetry with ST and salvos of atrial tachycardia And also shorter runs of NSVT only 3-5 beats. From cardiac perspective HR control in setting of NIDCM is most important. D/c bisoprolol not strong enough and start coreg 6.25 bid.titrate up while in hospital. She has some mild CRF and not clear if this is why She is not on ACE. She is unlikely to take / afford entresto. Would put back on low dose lasix and low dose lisinopril and follow Cr Baseline CR appears to be 1.8 but has gone over 2 in past with fluid shifts. She would like to be d/c to [facility] center when wounds Improved as her sister works there. Consider f/u ABI's to make sure circulation ok when edema less  and wounds better healed   Jenkins Rouge

## 2017-08-04 ENCOUNTER — Other Ambulatory Visit: Payer: Self-pay | Admitting: *Deleted

## 2017-08-04 ENCOUNTER — Inpatient Hospital Stay (HOSPITAL_COMMUNITY): Payer: Medicare Other

## 2017-08-04 DIAGNOSIS — I471 Supraventricular tachycardia: Secondary | ICD-10-CM

## 2017-08-04 DIAGNOSIS — I428 Other cardiomyopathies: Secondary | ICD-10-CM

## 2017-08-04 DIAGNOSIS — Z9114 Patient's other noncompliance with medication regimen: Secondary | ICD-10-CM

## 2017-08-04 LAB — GLUCOSE, CAPILLARY
Glucose-Capillary: 103 mg/dL — ABNORMAL HIGH (ref 65–99)
Glucose-Capillary: 192 mg/dL — ABNORMAL HIGH (ref 65–99)
Glucose-Capillary: 200 mg/dL — ABNORMAL HIGH (ref 65–99)
Glucose-Capillary: 143 mg/dL — ABNORMAL HIGH (ref 65–99)

## 2017-08-04 LAB — CBC WITH DIFFERENTIAL/PLATELET
Basophils Absolute: 0 10*3/uL (ref 0.0–0.1)
Basophils Relative: 0 %
Eosinophils Absolute: 0.1 10*3/uL (ref 0.0–0.7)
Eosinophils Relative: 1 %
HCT: 32.8 % — ABNORMAL LOW (ref 36.0–46.0)
Hemoglobin: 10.2 g/dL — ABNORMAL LOW (ref 12.0–15.0)
Lymphocytes Relative: 12 %
Lymphs Abs: 1.1 10*3/uL (ref 0.7–4.0)
MCH: 30.6 pg (ref 26.0–34.0)
MCHC: 31.1 g/dL (ref 30.0–36.0)
MCV: 98.5 fL (ref 78.0–100.0)
Monocytes Absolute: 0.6 10*3/uL (ref 0.1–1.0)
Monocytes Relative: 7 %
Neutro Abs: 7.6 10*3/uL (ref 1.7–7.7)
Neutrophils Relative %: 80 %
Platelets: 400 10*3/uL (ref 150–400)
RBC: 3.33 MIL/uL — ABNORMAL LOW (ref 3.87–5.11)
RDW: 15.2 % (ref 11.5–15.5)
WBC: 9.4 10*3/uL (ref 4.0–10.5)

## 2017-08-04 LAB — FOLATE: Folate: 21.6 ng/mL (ref >5.9)

## 2017-08-04 LAB — IRON AND TIBC
Iron: 36 ug/dL (ref 28–170)
Saturation Ratios: 12 % (ref 10.4–31.8)
TIBC: 297 ug/dL (ref 250–450)
UIBC: 261 ug/dL

## 2017-08-04 LAB — BASIC METABOLIC PANEL
Anion gap: 12 (ref 5–15)
BUN: 22 mg/dL — ABNORMAL HIGH (ref 6–20)
CO2: 23 mmol/L (ref 22–32)
Calcium: 9 mg/dL (ref 8.9–10.3)
Chloride: 108 mmol/L (ref 101–111)
Creatinine, Ser: 1.31 mg/dL — ABNORMAL HIGH (ref 0.44–1.00)
GFR calc Af Amer: 53 mL/min — ABNORMAL LOW (ref >60)
GFR calc non Af Amer: 46 mL/min — ABNORMAL LOW (ref >60)
Glucose, Bld: 155 mg/dL — ABNORMAL HIGH (ref 65–99)
Potassium: 4.5 mmol/L (ref 3.5–5.1)
Sodium: 143 mmol/L (ref 135–145)

## 2017-08-04 LAB — FERRITIN: Ferritin: 634 ng/mL — ABNORMAL HIGH (ref 11–307)

## 2017-08-04 LAB — VITAMIN B12: Vitamin B-12: 433 pg/mL (ref 180–914)

## 2017-08-04 MED ORDER — CARVEDILOL 12.5 MG PO TABS
12.5000 mg | ORAL_TABLET | Freq: Two times a day (BID) | ORAL | Status: DC
  Administered 2017-08-04 – 2017-08-05 (×2): 12.5 mg via ORAL
  Filled 2017-08-04 (×2): qty 1

## 2017-08-04 MED ORDER — INSULIN ASPART 100 UNIT/ML ~~LOC~~ SOLN: 0.0000 [IU] | Freq: Every day | SUBCUTANEOUS | Status: DC

## 2017-08-04 MED ORDER — ROSUVASTATIN CALCIUM 20 MG PO TABS
40.0000 mg | ORAL_TABLET | Freq: Every day | ORAL | Status: DC
  Administered 2017-08-04 – 2017-08-06 (×3): 40 mg via ORAL
  Filled 2017-08-04 (×3): qty 2

## 2017-08-04 MED ORDER — INSULIN ASPART 100 UNIT/ML ~~LOC~~ SOLN
0.0000 [IU] | Freq: Three times a day (TID) | SUBCUTANEOUS | Status: DC
  Administered 2017-08-04: 3 [IU] via SUBCUTANEOUS
  Administered 2017-08-05: 2 [IU] via SUBCUTANEOUS
  Administered 2017-08-05: 5 [IU] via SUBCUTANEOUS
  Administered 2017-08-06: 2 [IU] via SUBCUTANEOUS

## 2017-08-04 MED ORDER — CLINDAMYCIN PHOSPHATE 600 MG/50ML IV SOLN
600.0000 mg | Freq: Three times a day (TID) | INTRAVENOUS | Status: DC
  Administered 2017-08-04 – 2017-08-06 (×6): 600 mg via INTRAVENOUS
  Filled 2017-08-04 (×16): qty 50

## 2017-08-04 NOTE — Clinical Social Work Note (Signed)
Clinical Social Work Assessment  Patient Details  Name: [name] MRN: [id number] Date of Birth: 09-Mar-1966  Date of referral:  08/04/17               Reason for consult:  Discharge Planning                Permission sought to share information with:    Permission granted to share information::     Name::        Agency::     Relationship::     Contact Information:     Housing/Transportation Living arrangements for the past 2 months:  Hotel/Motel, [place] of Information:  Patient Patient Interpreter Needed:  None Criminal Activity/Legal Involvement Pertinent to Current Situation/Hospitalization:  No - Comment as needed Significant Relationships:  Adult Children, Siblings Lives with:  Facility Resident Do you feel safe going back to the place where you live?  Yes Need for family participation in patient care:  Yes (Comment)  Care giving concerns:  Patient has been at [facility] for approximately a month. She was at the facility for short term rehab. Patient reports that she completed two appeals with her insurance company and won them which extended her stay at the facility. Patient was released from the facility on this past Saturday. She stated that she did not do a third appeal due to her no longer wanting to be at that facility. Patient stated that when she was in the facility staff assisted her with her ADLs. Patient states that at baseline, she can hold on to something or stand behind her wheelchair and walk.    Social Worker assessment / plan:  LCSW will await PT evaluation and follow recommendation based on patient's needs. Patient desires to go to the [facility].   Employment status:  Disabled (Comment on whether or not currently receiving Disability) Insurance information:  Programmer, applications, Medicaid In [place] PT Recommendations:  Not assessed at this time Information / Referral to community resources:     Patient/Family's Response to care:  Patient wants to go to [facility].   Patient/Family's Understanding of and Emotional Response to Diagnosis, Current Treatment, and Prognosis:  Patient understands her diagnosis, treatment and prognosis.   Emotional Assessment Appearance:  Appears stated age Attitude/Demeanor/Rapport:    Affect (typically observed):  Calm Orientation:  Oriented to Situation, Oriented to  Time, Oriented to Place, Oriented to Self Alcohol / Substance use:  Tobacco Use Psych involvement (Current and /or in the community):  No (Comment)  Discharge Needs  Concerns to be addressed:  Discharge Planning Concerns Readmission within the last 30 days:  No Current discharge risk:  Chronically ill Barriers to Discharge:  No Barriers Identified   Ihor Gully, LCSW 08/04/2017, 2:31 PM

## 2017-08-04 NOTE — Evaluation (Signed)
Physical Therapy Evaluation Patient Details Name: [name] MRN: [id number] DOB: 1966-07-25 Today's Date: 08/04/2017   History of Present Illness  [name] is a 51 y.o. female with medical history significant of alcohol use, previous tobacco, cocaine and cannabis use, anxiety, panic attacks, depression, history of left breast mass, chronic kidney disease, COPD, type 2 diabetes, diabetic Charcot foot, diabetic neuropathy, essential hypertension, history of GI bleed, hyperlipidemia, recent thalamic CVA who just came home on Saturday from rehabilitation and comes to the emergency department reporting of worsening of bilateral lower extremities blisters. She was seen earlier today by her home health nurse who noticed that her right foot wound was started to have a foul smell and asked her to come to the hospital for further evaluation. She denies fever, chills, but feels fatigued. No headache, sore throat, productive cough, wheezing, chest pain, palpitations, dizziness, diaphoresis, PND orthopnea. Denies abdominal pain, nausea, emesis, diarrhea or constipation. No dysuria, no frequency or gross hematuria. She denies polyuria, blurred vision or polydipsia.    Clinical Impression  Patient limited for taking steps due to increased pain in bilateral feet and legs, bilateral limited ankle dorsiflexion - 15 degrees due to pain/bandages on top of foot.  Patient will benefit from continued physical therapy in hospital and recommended venue below to increase strength, balance, endurance for safe ADLs and gait.    Follow Up Recommendations SNF;Supervision/Assistance - 24 hour    Equipment Recommendations   (4 wheeled Walker with seat and brakes)    Recommendations for Other Services       Precautions / Restrictions Precautions Precautions: Fall Restrictions Weight Bearing Restrictions: No      Mobility  Bed Mobility Overal bed mobility: Modified Independent             General  bed mobility comments: requires increased time  Transfers Overall transfer level: Needs assistance Equipment used: Rolling walker (2 wheeled) Transfers: Sit to/from [facility] Sit to Stand: Min guard Stand pivot transfers: Min guard       General transfer comment: slow labored movement  Ambulation/Gait Ambulation/Gait assistance: Min assist Ambulation Distance (Feet): 5 Feet Assistive device: Rolling walker (2 wheeled) Gait Pattern/deviations: Decreased step length - left;Decreased step length - right;Decreased stance time - right;Decreased stance time - left;Decreased stride length Gait velocity: slow Gait velocity interpretation: Below normal speed for age/gender General Gait Details: limited to a few steps to transfer to [facility] and gurney  Stairs            Wheelchair Mobility    Modified Rankin (Stroke Patients Only)       Balance Overall balance assessment: Needs assistance Sitting-balance support: Feet supported;No upper extremity supported Sitting balance-Leahy Scale: Good     Standing balance support: Bilateral upper extremity supported;During functional activity Standing balance-Leahy Scale: Fair                               Pertinent Vitals/Pain Pain Assessment: 0-10 Pain Score: 9  Pain Location: BLE especially on top and bottom of feet at wound sites Pain Descriptors / Indicators: Burning;Aching;Nagging Pain Intervention(s): Limited activity within patient's tolerance;Monitored during session    Home Living Family/patient expects to be discharged to:: Private residence Living Arrangements: Children;Other relatives Available Help at Discharge: Family Type of Home: House Home Access: Level entry     Home Layout: One level Home Equipment: Wheelchair - manual Additional Comments: has wheelchair only, wants a 4 wheeled  walker with a seat    Prior Function Level of Independence: Independent with assistive device(s)                Hand Dominance        Extremity/Trunk Assessment   Upper Extremity Assessment Upper Extremity Assessment: Overall WFL for tasks assessed    Lower Extremity Assessment Lower Extremity Assessment: Generalized weakness    Cervical / Trunk Assessment Cervical / Trunk Assessment: Kyphotic  Communication   Communication: No difficulties  Cognition Arousal/Alertness: Awake/alert Behavior During Therapy: WFL for tasks assessed/performed Overall Cognitive Status: Within Functional Limits for tasks assessed                                        General Comments      Exercises     Assessment/Plan    PT Assessment Patient needs continued PT services  PT Problem List Decreased strength;Decreased activity tolerance;Decreased range of motion;Decreased balance;Decreased mobility       PT Treatment Interventions Gait training;Therapeutic exercise;Functional mobility training;Therapeutic activities;Patient/family education    PT Goals (Current goals can be found in the Care Plan section)  Acute Rehab PT Goals Patient Stated Goal: "wants to be independent again" PT Goal Formulation: With patient Time For Goal Achievement: 08/18/17 Potential to Achieve Goals: Good    Frequency Min 3X/week   Barriers to discharge        Co-evaluation               AM-PAC PT "6 Clicks" Daily Activity  Outcome Measure Difficulty turning over in bed (including adjusting bedclothes, sheets and blankets)?: None Difficulty moving from lying on back to sitting on the side of the bed? : None Difficulty sitting down on and standing up from a chair with arms (e.g., wheelchair, bedside commode, etc,.)?: None Help needed moving to and from a bed to chair (including a wheelchair)?: A Little Help needed walking in hospital room?: A Little Help needed climbing 3-5 steps with a railing? : A Lot 6 Click Score: 20    End of Session   Activity Tolerance: Patient  limited by fatigue;Patient limited by pain Patient left: in bed (left in gurney to be taken for a procedure with nursing staff assisting) Nurse Communication: Mobility status PT Visit Diagnosis: Unsteadiness on feet (R26.81);Other abnormalities of gait and mobility (R26.89);Muscle weakness (generalized) (M62.81)    Time: 3845-3646 PT Time Calculation (min) (ACUTE ONLY): 32 min   Charges:   PT Evaluation $PT Eval Low Complexity: 1 Low PT Treatments $Therapeutic Activity: 23-37 mins   PT G Codes:        2:54 PM, 2017-08-20 Lonell Grandchild, MPT Physical Therapist with [facility] [phone] office [phone] mobile phone
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
## 2017-08-04 NOTE — Clinical Social Work Placement (Signed)
   CLINICAL SOCIAL WORK PLACEMENT  NOTE  Date:  08/04/2017  Patient Details  Name: [name] MRN: [id number] Date of Birth: May 23, 1966  Clinical Social Work is seeking post-discharge placement for this patient at the [place] level of care (*CSW will initial, date and re-position this form in  chart as items are completed):  Yes   Patient/family provided with [place] Work Department's list of facilities offering this level of care within the geographic area requested by the patient (or if unable, by the patient's family).  Yes   Patient/family informed of their freedom to choose among providers that offer the needed level of care, that participate in Medicare, Medicaid or managed care program needed by the patient, have an available bed and are willing to accept the patient.  Yes   Patient/family informed of [facility]'s ownership interest in [facility] and [facility], as well as of the fact that they are under no obligation to receive care at these facilities.  PASRR submitted to EDS on       PASRR number received on       Existing PASRR number confirmed on 08/04/17     FL2 transmitted to all facilities in geographic area requested by pt/family on 08/04/17     FL2 transmitted to all facilities within larger geographic area on       Patient informed that his/her managed care company has contracts with or will negotiate with certain facilities, including the following:            Patient/family informed of bed offers received.  Patient chooses bed at       Physician recommends and patient chooses bed at      Patient to be transferred to   on  .  Patient to be transferred to facility by       Patient family notified on   of transfer.  Name of family member notified:        PHYSICIAN       Additional Comment:    _______________________________________________ Ihor Gully, LCSW 08/04/2017, 3:13 PM

## 2017-08-04 NOTE — Progress Notes (Addendum)
Progress Note  Patient Name: [name] Date of Encounter: 08/04/2017  Primary Cardiologist: Dr. Kate Sable  Subjective   Complains of bilateral leg pain. No chest pain. Feeling of orthopnea at nighttime. No palpitations.  Inpatient Medications    Scheduled Meds: . albuterol  2.5 mg Nebulization TID  . aspirin EC  81 mg Oral Daily  . carvedilol  12.5 mg Oral BID WC  . cloNIDine  0.2 mg Oral QHS  . clopidogrel  75 mg Oral Q breakfast  . collagenase   Topical Daily  . ezetimibe  10 mg Oral Daily  . FLUoxetine  20 mg Oral Daily  . fluticasone  2 spray Each Nare QHS  . furosemide  20 mg Oral Daily  . gabapentin  100 mg Oral TID  . heparin  5,000 Units Subcutaneous Q8H  . insulin aspart  0-9 Units Subcutaneous TID WC  . lisinopril  5 mg Oral Daily  . montelukast  10 mg Oral QHS  . pantoprazole  40 mg Oral Daily  . potassium chloride SA  20 mEq Oral Daily  . tiotropium  18 mcg Inhalation Daily  . traZODone  50 mg Oral QHS   Continuous Infusions: . cefTRIAXone (ROCEPHIN)  IV Stopped (08/04/17 0115)   PRN Meds: ipratropium-albuterol, metoprolol tartrate, ondansetron **OR** ondansetron (ZOFRAN) IV, oxyCODONE-acetaminophen   Vital Signs    Vitals:   08/03/17 1344 08/03/17 1500 08/03/17 1956 08/04/17 0633  BP:  (!) 138/94 120/89 (!) 143/95  Pulse:  90 88 96  Resp:  20 20 20   Temp:  98.3 F (36.8 C) 97.8 F (36.6 C) (!) 97.4 F (36.3 C)  TempSrc:  Oral Oral Oral  SpO2: 94% 99% 100% 100%  Weight:      Height:        Intake/Output Summary (Last 24 hours) at 08/04/17 0923 Last data filed at 08/03/17 1734  Gross per 24 hour  Intake              480 ml  Output                0 ml  Net              480 ml   Filed Weights   08/02/17 1738 08/03/17 0125  Weight: 133 lb (60.3 kg) 133 lb (60.3 kg)    Telemetry    Sinus rhythm and sinus tachycardia with bursts of atrial tachycardia that are generally nonsustained - personally reviewed  Physical Exam      GEN: Chronically ill-appearing woman. No acute distress.   Neck: No JVD. Cardiac: RRR, soft systolic murmurs, no gallops.  Respiratory: Decreased breath sounds right lung base otherwise clear. GI: Soft, nontender, non-distended. MS: Bandaged legs and feet, !+ edema feet bilaterally Neuro:  Nonfocal. Psych: Normal affect.  Labs    Chemistry  Recent Labs Lab 08/02/17 2108 08/03/17 0450  NA 144 141  K 3.9 3.5  CL 108 106  CO2 24 23  GLUCOSE 95 118*  BUN 21* 20  CREATININE 1.26* 1.21*  CALCIUM 9.5 8.8*  PROT  --  6.3*  ALBUMIN  --  2.3*  AST  --  18  ALT  --  17  ALKPHOS  --  48  BILITOT  --  0.8  GFRNONAA 48* 50*  GFRAA 55* 58*  ANIONGAP 12 12     Hematology  Recent Labs Lab 08/02/17 2108 08/03/17 0450 08/03/17 1437  WBC 13.0* 10.5  --   RBC  3.41* 3.01* 3.17*  HGB 10.4* 9.3*  --   HCT 33.9* 29.8*  --   MCV 99.4 99.0  --   MCH 30.5 30.9  --   MCHC 30.7 31.2  --   RDW 15.1 15.1  --   PLT 465* 401*  --     Radiology    Dg Foot Complete Right  Result Date: 08/02/2017 CLINICAL DATA:  Multiple wounds on her legs, RIGHT foot has begun to have foul smell when home health nurse came to change dressings today EXAM: RIGHT FOOT COMPLETE - 3+ VIEW COMPARISON:  11/11/2009 FINDINGS: Mild diffuse osseous demineralization. Scattered soft tissue swelling. Pes planus. Small plantar calcaneal spur. Ossicle at dorsal margin of the tarsal navicular, appears corticated and old. Nondisplaced fracture identified at base of proximal phalanx third toe. No additional fracture, dislocation, or bone destruction. IMPRESSION: Extensive soft tissue swelling and pes planus with midfoot degenerative changes. Nondisplaced fracture at base of proximal phalanx RIGHT third toe. Electronically Signed   By: Lavonia Dana M.D.   On: 08/02/2017 23:40    Cardiac Studies   Echocardiogram 06/22/17: Study Conclusions   - Left ventricle: The cavity size was normal. Wall thickness was   increased in  a pattern of moderate LVH. Systolic function was   moderately to severely reduced. The estimated ejection fraction   was in the range of 30% to 35%. Diffuse hypokinesis. Features are   consistent with a pseudonormal left ventricular filling pattern,   with concomitant abnormal relaxation and increased filling   pressure (grade 2 diastolic dysfunction). - Mitral valve: There was moderate regurgitation directed   posteriorly. - Left atrium: The atrium was mildly dilated.   Cardiac catheterization 07/2016:    LV end diastolic pressure is mildly elevated.  There is no aortic valve stenosis.  No angiographically apparent coronary artery disease.  Tortuous right subclavian requiring Glide wire. Would not use right radial approach if cath was needed in the future.   Continue aggressive medical therapy and blood pressure control.       Patient Profile      [name] is a 51 y.o. female with a hx of hypertensive cardiomyopathy, uncontrolled hypertension, medication noncompliance, normal coronary arteries 07/2016, recent thalamic CVA, with OP rehab, CKD, type II diabetes, ETOH abuse, anxiety, depression, panic attacks, GIB, and COPD who was admitted with leg cellulitis. Cardiology is following for medication adjustments in the setting of cardiomyopathy and recently documented atrial tachycardia.  Assessment & Plan    1. Paroxysmal atrial tachycardia. Switched to Coreg from bisoprolol yesterday. General heart rate trend is better but still has episodes of atrial tachycardia. Will increase to 12.5 mg BID today.    2. Nonischemic cardiomyopathy. LVEF 30-35%, normal coronary arteries in 2017. In addition to Coreg, patient placed on lisinopril and Lasix.  3. Acute on chronic mixed CHF: On Lasix 20 mg PO daily. I/O's are incomplete. Weight stable.  4. Bilateral lower extremity cellulitis: Followed by primary team and on antibiotic therapy.   5. Hypertension: Elevated on admission but  wasn't taking medications. On clonidine  0.2 mg daily and lisinopril 5 mg started yesterday. Watch with renal insufficiency. Crt 1.21.     Signed, Ermalinda Barrios PA-C 08/04/2017, 9:23 AM     Attending note:  Patient seen and examined. Reviewed records and consultation note by Dr. Johnsie Cancel. Patient currently admitted for management of leg cellulitis. Noted to have episodes of atrial tachycardia in the face of cardiomyopathy and medication noncompliance. She has  been placed back on a cardiac regimen with switch from bisoprolol to Coreg, initiation of lisinopril, aspirin and Plavix, and Lasix with potassium supplements. She denies any chest pain, has had some orthopnea. Intake and output are incomplete, but her weight is stable.  On examination this morning systolic blood pressures in the 140s, heart rate in the 80s. Telemetry still shows episodes of atrial tachycardia but general heart rate trend is somewhat better. Lungs exhibit decreased breath sounds at the bases, no wheezing. Cardiac exam reveals indistinct PMI with RRR and occasional ectopic beats. She has mild bilateral foot edema. Dressings in place on both legs. Lab work shows potassium 3.5, creatinine 1.2, hemoglobin 9.3.  Continue current medical therapy for paroxysmal atrial tachycardia and nonischemic cardiomyopathy. Agree with increase in Coreg dose to 12.5 mg twice daily. Follow-up urine output and BMET next 24 hours. Plan to continue low-dose Lasix with potassium supplements for now, can adjust as needed.  Satira Sark, M.D., F.A.C.C.

## 2017-08-04 NOTE — Patient Outreach (Signed)
[facility]) Care Management  08/04/2017  [name] Sep 09, 1966 [id number]   [name] was referred to [facility] team for transition of care services and follow up. Upon review of the medical record today, it is noted that [name] is now being evaluated by physical therapy with intended discharge plan of skilled nursing facility.   Plan: I will notify our post acute case manager of [name] status.    [facility] Management  [phone]

## 2017-08-04 NOTE — Progress Notes (Signed)
PROGRESS NOTE                                                                                                                                                                                                             Patient Demographics:    [name], is a 51 y.o. female, DOB - 1965/11/29, VZC:[id number]  Admit date - 08/02/2017   Admitting Physician Reubin Milan, MD  Outpatient Primary MD for the patient is [name], MD  LOS - 2  Outpatient Specialists:Dr. Blanche East  Chief Complaint  Patient presents with  . Blister       Brief Narrative  51 year old female with history of alcohol use, polysubstance use including cocaine and marijuana, history of anxiety, panic attacks and depression, chronic kidney disease, COPD, uncontrolled type 2 diabetes mellitus which are court foot and diabetic neuropathy, hypertension history of GI bleed and recent thalamic stroke and was discharged to rehabilitation and then sent home presented to the ED with bilateral lower leg blisters. X-ray of the foot showed extensive soft tissue swelling with degenerative changes of the foot. Also showed a nondisplaced fracture at the base of the proximal phallus of right third toe. Patient admitted for bilateral cellulitis of the foot with hospital course prolonged due to tachyarrhythmia.    Subjective:   Patient continues to complain of some pain in the lower legs (improved since admission). Heart rate still going up to 140s on the monitor.   Assessment  & Plan :    Principal Problem:   Cellulitis of both lower extremities I will switch her antibiotics to IV clindamycin given uncontrolled diabetes with cellulitis and superficial ulcers. Wound care consult appreciated, appears to have ruptured bulla with partial-thickness wound and multiple full-thickness wound in bilateral foot. Recommend Xeroform gauze and Santyl ointment for  enzymatic debridement. Also recommend patient would benefit from sharp debridement at outpatient wound care center. Pain control with Percocet when necessary  Active Problems:   DM (diabetes mellitus), type 2, uncontrolled, with renal complications (HCC) On moderate sliding scale coverage. Patient is on Prandin antigen that home with uncontrolled A1c >15. Suspect medication none adherence.    CKD (chronic kidney disease) stage 3, GFR 30-59 ml/min (HCC) Renal function stable. Continue to monitor while patient on Lasix and ACE inhibitor.    Anxiety and depression Continue Prozac.  Nonischemic cardiomyopathy EF of 30-35%. Cardiology consult appreciated. Euvolemic. Continue Lasix, statin and beta blocker. Added ACE inhibitor.  Atrial tachycardia Added carvedilol, dose increased further today by cardiology.     Cerebral thrombosis with cerebral infarction Recent embolic CVA. Continue aspirin, Plavix and statin.Marland Kitchen    COPD (chronic obstructive pulmonary disease) (HCC) Stable. Continue inhaler and when necessary nebs.  Hypokalemia Supplemented  Chronic back pain with lumbar spinal stenosis Continue pain medications. Follow with Dr. Cyndy Freeze as outpatient.  Polysubstance abuse including cocaine  Needs counseling on cessation.     Code Status : Full code  Family Communication  : None at bedside  Disposition Plan  : Patient requesting to go to SNF. Will get PT evaluation  Barriers For Discharge : Active symptoms  Consults  : Cardiology  Procedures  : None  DVT Prophylaxis  :  Lovenox -   Lab Results  Component Value Date   PLT 400 08/04/2017    Antibiotics  :    Anti-infectives    Start     Dose/Rate Route Frequency Ordered Stop   08/04/17 1400  clindamycin (CLEOCIN) IVPB 600 mg     600 mg 100 mL/hr over 30 Minutes Intravenous Every 8 hours 08/04/17 1033     08/03/17 0130  cefTRIAXone (ROCEPHIN) 1 g in dextrose 5 % 50 mL IVPB  Status:  Discontinued     1 g 100  mL/hr over 30 Minutes Intravenous Every 24 hours 08/03/17 0112 08/04/17 1033   08/02/17 2115  amoxicillin-clavulanate (AUGMENTIN) 875-125 MG per tablet 1 tablet     1 tablet Oral  Once 08/02/17 2110 08/02/17 2138   08/02/17 2115  doxycycline (VIBRA-TABS) tablet 100 mg     100 mg Oral  Once 08/02/17 2110 08/02/17 2138        Objective:   Vitals:   08/03/17 1500 08/03/17 1956 08/04/17 0633 08/04/17 0943  BP: (!) 138/94 120/89 (!) 143/95   Pulse: 90 88 96   Resp: 20 20 20    Temp: 98.3 F (36.8 C) 97.8 F (36.6 C) (!) 97.4 F (36.3 C)   TempSrc: Oral Oral Oral   SpO2: 99% 100% 100% 98%  Weight:      Height:        Wt Readings from Last 3 Encounters:  08/03/17 60.3 kg (133 lb)  06/30/17 79.1 kg (174 lb 4.8 oz)  12/15/16 83.5 kg (184 lb 1.9 oz)     Intake/Output Summary (Last 24 hours) at 08/04/17 1426 Last data filed at 08/03/17 1734  Gross per 24 hour  Intake              240 ml  Output                0 ml  Net              240 ml     Physical Exam  Gen: not in distress HEENT: Pallor present, moist mucosa, supple neck Chest: clear b/l, no added sounds CVS: S1 and S2 tachycardic, no murmurs rub or gallop GI: soft, NT, ND,  Musculoskeletal: Cellulitis over bilateral foot with superficial ulcerations     Data Review:    CBC  Recent Labs Lab 08/02/17 2108 08/03/17 0450 08/04/17 0958  WBC 13.0* 10.5 9.4  HGB 10.4* 9.3* 10.2*  HCT 33.9* 29.8* 32.8*  PLT 465* 401* 400  MCV 99.4 99.0 98.5  MCH 30.5 30.9 30.6  MCHC 30.7 31.2 31.1  RDW 15.1 15.1 15.2  LYMPHSABS  1.3 1.1 1.1  MONOABS 0.9 0.7 0.6  EOSABS 0.1 0.1 0.1  BASOSABS 0.0 0.0 0.0    Chemistries   Recent Labs Lab 08/02/17 2108 08/03/17 0450 08/04/17 0958  NA 144 141 143  K 3.9 3.5 4.5  CL 108 106 108  CO2 24 23 23   GLUCOSE 95 118* 155*  BUN 21* 20 22*  CREATININE 1.26* 1.21* 1.31*  CALCIUM 9.5 8.8* 9.0  MG 1.7  --   --   AST  --  18  --   ALT  --  17  --   ALKPHOS  --  48  --     BILITOT  --  0.8  --    ------------------------------------------------------------------------------------------------------------------ No results for input(s): CHOL, HDL, LDLCALC, TRIG, CHOLHDL, LDLDIRECT in the last 72 hours.  Lab Results  Component Value Date   HGBA1C >15.5 (H) 06/25/2017   ------------------------------------------------------------------------------------------------------------------ No results for input(s): TSH, T4TOTAL, T3FREE, THYROIDAB in the last 72 hours.  Invalid input(s): FREET3 ------------------------------------------------------------------------------------------------------------------  Recent Labs  08/03/17 1437  VITAMINB12 433  FOLATE 21.6  FERRITIN 634*  TIBC 297  IRON 36  RETICCTPCT 2.3    Coagulation profile No results for input(s): INR, PROTIME in the last 168 hours.  No results for input(s): DDIMER in the last 72 hours.  Cardiac Enzymes No results for input(s): CKMB, TROPONINI, MYOGLOBIN in the last 168 hours.  Invalid input(s): CK ------------------------------------------------------------------------------------------------------------------    Component Value Date/Time   BNP 835.0 (H) 06/22/2017 3016    Inpatient Medications  Scheduled Meds: . albuterol  2.5 mg Nebulization TID  . aspirin EC  81 mg Oral Daily  . carvedilol  12.5 mg Oral BID WC  . cloNIDine  0.2 mg Oral QHS  . clopidogrel  75 mg Oral Q breakfast  . collagenase   Topical Daily  . ezetimibe  10 mg Oral Daily  . FLUoxetine  20 mg Oral Daily  . fluticasone  2 spray Each Nare QHS  . furosemide  20 mg Oral Daily  . gabapentin  100 mg Oral TID  . heparin  5,000 Units Subcutaneous Q8H  . insulin aspart  0-9 Units Subcutaneous TID WC  . lisinopril  5 mg Oral Daily  . montelukast  10 mg Oral QHS  . pantoprazole  40 mg Oral Daily  . potassium chloride SA  20 mEq Oral Daily  . rosuvastatin  40 mg Oral Daily  . tiotropium  18 mcg Inhalation Daily  .  traZODone  50 mg Oral QHS   Continuous Infusions: . clindamycin (CLEOCIN) IV     PRN Meds:.ipratropium-albuterol, metoprolol tartrate, ondansetron **OR** ondansetron (ZOFRAN) IV, oxyCODONE-acetaminophen  Micro Results No results found for this or any previous visit (from the past 240 hour(s)).  Radiology Reports Dg Foot Complete Right  Result Date: 08/02/2017 CLINICAL DATA:  Multiple wounds on her legs, RIGHT foot has begun to have foul smell when home health nurse came to change dressings today EXAM: RIGHT FOOT COMPLETE - 3+ VIEW COMPARISON:  11/11/2009 FINDINGS: Mild diffuse osseous demineralization. Scattered soft tissue swelling. Pes planus. Small plantar calcaneal spur. Ossicle at dorsal margin of the tarsal navicular, appears corticated and old. Nondisplaced fracture identified at base of proximal phalanx third toe. No additional fracture, dislocation, or bone destruction. IMPRESSION: Extensive soft tissue swelling and pes planus with midfoot degenerative changes. Nondisplaced fracture at base of proximal phalanx RIGHT third toe. Electronically Signed   By: Lavonia Dana M.D.   On: 08/02/2017 23:40    Time  Spent in minutes  35   Louellen Molder M.D on 08/04/2017 at 2:26 PM  Between 7am to 7pm - Pager - [phone]  After 7pm go to [email or web address] - password [facility]  [facility] -  Office  [phone]

## 2017-08-05 DIAGNOSIS — L97909 Non-pressure chronic ulcer of unspecified part of unspecified lower leg with unspecified severity: Secondary | ICD-10-CM

## 2017-08-05 DIAGNOSIS — R000 Tachycardia, unspecified: Secondary | ICD-10-CM

## 2017-08-05 LAB — BASIC METABOLIC PANEL
Anion gap: 9 (ref 5–15)
BUN: 22 mg/dL — ABNORMAL HIGH (ref 6–20)
CO2: 23 mmol/L (ref 22–32)
Calcium: 8.8 mg/dL — ABNORMAL LOW (ref 8.9–10.3)
Chloride: 107 mmol/L (ref 101–111)
Creatinine, Ser: 1.25 mg/dL — ABNORMAL HIGH (ref 0.44–1.00)
GFR calc Af Amer: 56 mL/min — ABNORMAL LOW (ref >60)
GFR calc non Af Amer: 48 mL/min — ABNORMAL LOW (ref >60)
Glucose, Bld: 160 mg/dL — ABNORMAL HIGH (ref 65–99)
Potassium: 4.6 mmol/L (ref 3.5–5.1)
Sodium: 139 mmol/L (ref 135–145)

## 2017-08-05 LAB — GLUCOSE, CAPILLARY
Glucose-Capillary: 146 mg/dL — ABNORMAL HIGH (ref 65–99)
Glucose-Capillary: 248 mg/dL — ABNORMAL HIGH (ref 65–99)
Glucose-Capillary: 106 mg/dL — ABNORMAL HIGH (ref 65–99)

## 2017-08-05 MED ORDER — CARVEDILOL 12.5 MG PO TABS
25.0000 mg | ORAL_TABLET | Freq: Two times a day (BID) | ORAL | Status: DC
  Administered 2017-08-05 – 2017-08-06 (×2): 25 mg via ORAL
  Filled 2017-08-05 (×2): qty 2

## 2017-08-05 NOTE — Progress Notes (Addendum)
[facility]  PROGRESS NOTE  [name] UXL:[id number] DOB: Nov 05, 1965 DOA: 08/02/2017 PCP: [name], MD   Brief HPI:   51 y.o. female with medical history significant of alcohol use, previous tobacco, cocaine and cannabis use, anxiety, panic attacks, depression, history of left breast mass, chronic kidney disease, COPD, type 2 diabetes, diabetic Charcot foot, diabetic neuropathy, essential hypertension, history of GI bleed, hyperlipidemia, recent thalamic CVA who just came home on Saturday from rehabilitation and comes to the emergency department reporting of worsening of bilateral lower extremities blisters Her right foot x-ray showsExtensive soft tissue swelling and pes planus with midfoot degenerative changes. Nondisplaced fracture at base of proximal phalanx RIGHT third toe.   Subjective   Patient feels better. Denies pain.   Assessment/Plan:     1. Cellulitis of both lower extremities-Antibiotics changed to clindamycin. Wound care following.. Pain controlled with Percocet.  2. Tachyarrhythmia- EKG showed atrial tachycardia, patient started on carvedilol. Cardiology recommends increasing the dose of carvedilol, amiodarone if persistent burst of atrial tachycardia after 2 weeks of high-dose beta blockers. 3. Acute on chronic combined systolic and diastolic CHF- currently euvolemic, continue Lasix 20 mg by mouth daily. Cardiology has signed off. 4. Hypertension- continue clonidine, carvedilol. 5. Nonischemic cardio myopathy- EF 35%, patient started on Coreg 25 mg by mouth twice a day. 6. CKD stage 3- creatinine is stable at 1.25. 7. Diabetes mellitus type 2 with renal complications-continue carbohydrate modified diet, will hold Prandin and Tradjenta, continue with sliding scale insulin with NovoLog. 8. Cerebral thrombosis with cerebral infarction-stable, continue low-dose aspirin and Plavix 9. COPD-stable continue DuoNeb's every 6 hours when necessary, Spiriva,  Singulair 10. Chronic anemia-hemoglobin is 10.2, stable. Iron level 36, saturation is a 12% . Mild iron patient's anemia. Stool for occult blood is pending     DVT prophylaxis: Heparin  Code Status: Full code  Family Communication: No family present at bedside, discussed with patient   Disposition Plan: Likely skilled nursing facility in a.m.   Consultants:  None  Procedures:  None  Continuous infusions . clindamycin (CLEOCIN) IV Stopped (08/05/17 1316)      Antibiotics:   Anti-infectives    Start     Dose/Rate Route Frequency Ordered Stop   08/04/17 1400  clindamycin (CLEOCIN) IVPB 600 mg     600 mg 100 mL/hr over 30 Minutes Intravenous Every 8 hours 08/04/17 1033     08/03/17 0130  cefTRIAXone (ROCEPHIN) 1 g in dextrose 5 % 50 mL IVPB  Status:  Discontinued     1 g 100 mL/hr over 30 Minutes Intravenous Every 24 hours 08/03/17 0112 08/04/17 1033   08/02/17 2115  amoxicillin-clavulanate (AUGMENTIN) 875-125 MG per tablet 1 tablet     1 tablet Oral  Once 08/02/17 2110 08/02/17 2138   08/02/17 2115  doxycycline (VIBRA-TABS) tablet 100 mg     100 mg Oral  Once 08/02/17 2110 08/02/17 2138       Objective   Vitals:   08/05/17 0738 08/05/17 0750 08/05/17 1343 08/05/17 1405  BP:    (!) 135/103  Pulse:    63  Resp:    16  Temp:    98 F (36.7 C)  TempSrc:    Oral  SpO2: 99% 99% 99% (!) 84%  Weight:      Height:        Intake/Output Summary (Last 24 hours) at 08/05/17 1607 Last data filed at 08/05/17 0900  Gross per 24 hour  Intake  240 ml  Output                0 ml  Net              240 ml   Filed Weights   08/02/17 1738 08/03/17 0125  Weight: 60.3 kg (133 lb) 60.3 kg (133 lb)     Physical Examination:   Physical Exam: Eyes: No icterus, extraocular muscles intact  Mouth: Oral mucosa is moist, no lesions on palate,  Neck: Supple, no deformities, masses, or tenderness Lungs: Normal respiratory effort, bilateral clear to auscultation,  no crackles or wheezes.  Heart: Regular rate and rhythm, S1 and S2 normal, no murmurs, rubs auscultated Abdomen: BS normoactive,soft,nondistended,non-tender to palpation,no organomegaly Extremities: No pretibial edema, no erythema, no cyanosis, no clubbing Neuro : Alert and oriented to time, place and person, No focal deficits  Skin: Bilateral lower extremities in dressing     Data Reviewed: I have personally reviewed following labs and imaging studies  CBG:  Recent Labs Lab 08/04/17 1132 08/04/17 1716 08/04/17 2205 08/05/17 0742 08/05/17 1125  GLUCAP 192* 200* 143* 146* 248*    CBC:  Recent Labs Lab 08/02/17 2108 08/03/17 0450 08/04/17 0958  WBC 13.0* 10.5 9.4  NEUTROABS 10.6* 8.6* 7.6  HGB 10.4* 9.3* 10.2*  HCT 33.9* 29.8* 32.8*  MCV 99.4 99.0 98.5  PLT 465* 401* 272    Basic Metabolic Panel:  Recent Labs Lab 08/02/17 2108 08/03/17 0450 08/04/17 0958 08/05/17 0437  NA 144 141 143 139  K 3.9 3.5 4.5 4.6  CL 108 106 108 107  CO2 24 23 23 23   GLUCOSE 95 118* 155* 160*  BUN 21* 20 22* 22*  CREATININE 1.26* 1.21* 1.31* 1.25*  CALCIUM 9.5 8.8* 9.0 8.8*  MG 1.7  --   --   --     No results found for this or any previous visit (from the past 240 hour(s)).   Liver Function Tests:  Recent Labs Lab 08/03/17 0450  AST 18  ALT 17  ALKPHOS 48  BILITOT 0.8  PROT 6.3*  ALBUMIN 2.3*   No results for input(s): LIPASE, AMYLASE in the last 168 hours. No results for input(s): AMMONIA in the last 168 hours.  Cardiac Enzymes: No results for input(s): CKTOTAL, CKMB, CKMBINDEX, TROPONINI in the last 168 hours. BNP (last 3 results)  Recent Labs  06/21/17 1637 06/22/17 0337  BNP 1,152.0* 835.0*    ProBNP (last 3 results) No results for input(s): PROBNP in the last 8760 hours.    Studies: US Arterial Abi (screening Lower Extremity)  Result Date: 08/04/2017 CLINICAL DATA:  50 year old female with a history of leg wounds. Cardiovascular risk factors  include smoking, hypertension, hyperlipidemia, diabetes EXAM: NONINVASIVE PHYSIOLOGIC VASCULAR STUDY OF BILATERAL LOWER EXTREMITIES TECHNIQUE: Evaluation of both lower extremities was performed at rest, including calculation of ankle-brachial indices, multiple segmental pressure evaluation, segmental Doppler and segmental pulse volume recording. COMPARISON:  None. FINDINGS: Right ABI:  Not obtained Left ABI:  1.18 Right Lower Extremity: Segmental Doppler of the right lower extremity demonstrates probable monophasic signal Left Lower Extremity: Segmental Doppler of the left lower extremity demonstrates probable monophasic signal IMPRESSION: Right: Resting ankle-brachial index on the right was not obtained. Segmental Doppler suggests monophasic signal at the ankle, indicating more proximal arterial occlusive disease. If there is concern for anatomic imaging, CT angiogram and runoff may be considered. Left: Resting ankle-brachial index within normal limits, though potentially falsely elevated secondary to noncompressible vessels, as the segmental  Doppler suggests proximal vascular disease. Again, if there is concern for anatomic imaging, CT angiogram and runoff may be useful. Signed, Dulcy Fanny. Earleen Newport, DO [facility] Electronically Signed   By: Corrie Mckusick D.O.   On: 08/04/2017 15:36    Scheduled Meds: . albuterol  2.5 mg Nebulization TID  . aspirin EC  81 mg Oral Daily  . carvedilol  25 mg Oral BID WC  . cloNIDine  0.2 mg Oral QHS  . clopidogrel  75 mg Oral Q breakfast  . collagenase   Topical Daily  . ezetimibe  10 mg Oral Daily  . FLUoxetine  20 mg Oral Daily  . fluticasone  2 spray Each Nare QHS  . furosemide  20 mg Oral Daily  . gabapentin  100 mg Oral TID  . heparin  5,000 Units Subcutaneous Q8H  . insulin aspart  0-15 Units Subcutaneous TID WC  . insulin aspart  0-5 Units Subcutaneous QHS  . lisinopril  5 mg Oral Daily  . montelukast   10 mg Oral QHS  . pantoprazole  40 mg Oral Daily  . potassium chloride SA  20 mEq Oral Daily  . rosuvastatin  40 mg Oral Daily  . tiotropium  18 mcg Inhalation Daily  . traZODone  50 mg Oral QHS      Time spent: 25 min  [facility] Pager [phone]. If 7PM-7AM, please contact night-coverage at [email or web address], Office  [phone]  password [email or web address]  08/05/2017, 4:07 PM  LOS: 3 days

## 2017-08-05 NOTE — Patient Outreach (Signed)
[facility]) Care Management  08/05/2017  ADRIA COSTLEY 08/07/1966 [id number]   CSW called & spoke with patient's sister, Junious Dresser at [facility] room A333 (ph#: [phone]). Patient was busy with a nurse, but patient's sister informed CSW that she plans to go to [facility] at discharge either today or tomorrow. CSW plans to follow-up with patient at SNF next week.    Raynaldo Opitz, LCSW [facility]  Clinical Social Worker cell #: [phone]

## 2017-08-05 NOTE — Progress Notes (Signed)
Progress Note  Patient Name: [name] Date of Encounter: 08/05/2017  Primary Cardiologist: Kate Sable, MD  Subjective   Less pain in legs but still having trouble with breathing especially at night.   Inpatient Medications    Scheduled Meds: . albuterol  2.5 mg Nebulization TID  . aspirin EC  81 mg Oral Daily  . carvedilol  12.5 mg Oral BID WC  . cloNIDine  0.2 mg Oral QHS  . clopidogrel  75 mg Oral Q breakfast  . collagenase   Topical Daily  . ezetimibe  10 mg Oral Daily  . FLUoxetine  20 mg Oral Daily  . fluticasone  2 spray Each Nare QHS  . furosemide  20 mg Oral Daily  . gabapentin  100 mg Oral TID  . heparin  5,000 Units Subcutaneous Q8H  . insulin aspart  0-15 Units Subcutaneous TID WC  . insulin aspart  0-5 Units Subcutaneous QHS  . lisinopril  5 mg Oral Daily  . montelukast  10 mg Oral QHS  . pantoprazole  40 mg Oral Daily  . potassium chloride SA  20 mEq Oral Daily  . rosuvastatin  40 mg Oral Daily  . tiotropium  18 mcg Inhalation Daily  . traZODone  50 mg Oral QHS   Continuous Infusions: . clindamycin (CLEOCIN) IV Stopped (08/05/17 0615)   PRN Meds: ipratropium-albuterol, metoprolol tartrate, ondansetron **OR** ondansetron (ZOFRAN) IV, oxyCODONE-acetaminophen   Vital Signs    Vitals:   08/04/17 2214 08/05/17 0545 08/05/17 0738 08/05/17 0750  BP:  120/80    Pulse:  79    Resp:  14    Temp:  98.4 F (36.9 C)    TempSrc:  Oral    SpO2: 100% 100% 99% 99%  Weight:      Height:        Intake/Output Summary (Last 24 hours) at 08/05/17 0832 Last data filed at 08/04/17 1503  Gross per 24 hour  Intake                0 ml  Output                0 ml  Net                0 ml   Filed Weights   08/02/17 1738 08/03/17 0125  Weight: 133 lb (60.3 kg) 133 lb (60.3 kg)    Telemetry    Atrial fib, with episodes of atrial tachycardia, some SVT, HR up to 159 bpm.  - Personally Reviewed  Physical Exam   GEN: No acute distress.   Neck:  No JVD Cardiac: IRRR, no murmurs, rubs, or gallops.  Respiratory: Clear to auscultation bilaterally. GI: Soft, nontender, non-distended  MS: 1+ bilateral edema with multiple dressings to pretibial area and feet bilaterally.  Neuro:  Nonfocal  Psych: Normal affect   Labs    Chemistry Recent Labs Lab 08/03/17 0450 08/04/17 0958 08/05/17 0437  NA 141 143 139  K 3.5 4.5 4.6  CL 106 108 107  CO2 23 23 23   GLUCOSE 118* 155* 160*  BUN 20 22* 22*  CREATININE 1.21* 1.31* 1.25*  CALCIUM 8.8* 9.0 8.8*  PROT 6.3*  --   --   ALBUMIN 2.3*  --   --   AST 18  --   --   ALT 17  --   --   ALKPHOS 48  --   --   BILITOT 0.8  --   --  GFRNONAA 50* 46* 48*  GFRAA 58* 53* 56*  ANIONGAP 12 12 9      Hematology Recent Labs Lab 08/02/17 2108 08/03/17 0450 08/03/17 1437 08/04/17 0958  WBC 13.0* 10.5  --  9.4  RBC 3.41* 3.01* 3.17* 3.33*  HGB 10.4* 9.3*  --  10.2*  HCT 33.9* 29.8*  --  32.8*  MCV 99.4 99.0  --  98.5  MCH 30.5 30.9  --  30.6  MCHC 30.7 31.2  --  31.1  RDW 15.1 15.1  --  15.2  PLT 465* 401*  --  400     Radiology    US Arterial Abi (screening Lower Extremity)  Result Date: 08/04/2017 CLINICAL DATA:  51 year old female with a history of leg wounds. Cardiovascular risk factors include smoking, hypertension, hyperlipidemia, diabetes EXAM: NONINVASIVE PHYSIOLOGIC VASCULAR STUDY OF BILATERAL LOWER EXTREMITIES TECHNIQUE: Evaluation of both lower extremities was performed at rest, including calculation of ankle-brachial indices, multiple segmental pressure evaluation, segmental Doppler and segmental pulse volume recording. COMPARISON:  None. FINDINGS: Right ABI:  Not obtained Left ABI:  1.18 Right Lower Extremity: Segmental Doppler of the right lower extremity demonstrates probable monophasic signal Left Lower Extremity: Segmental Doppler of the left lower extremity demonstrates probable monophasic signal IMPRESSION: Right: Resting ankle-brachial index on the right was not  obtained. Segmental Doppler suggests monophasic signal at the ankle, indicating more proximal arterial occlusive disease. If there is concern for anatomic imaging, CT angiogram and runoff may be considered. Left: Resting ankle-brachial index within normal limits, though potentially falsely elevated secondary to noncompressible vessels, as the segmental Doppler suggests proximal vascular disease. Again, if there is concern for anatomic imaging, CT angiogram and runoff may be useful. Signed, Dulcy Fanny. Earleen Newport, DO [facility] Radiology Electronically Signed   By: Corrie Mckusick D.O.   On: 08/04/2017 15:36    Cardiac Studies   Echocardiogram 06/22/17: Study Conclusions  - Left ventricle: The cavity size was normal. Wall thickness was increased in a pattern of moderate LVH. Systolic function was moderately to severely reduced. The estimated ejection fraction was in the range of 30% to 35%. Diffuse hypokinesis. Features are consistent with a pseudonormal left ventricular filling pattern, with concomitant abnormal relaxation and increased filling pressure (grade 2 diastolic dysfunction). - Mitral valve: There was moderate regurgitation directed posteriorly. - Left atrium: The atrium was mildly dilated.  Cardiac catheterization 07/2016:   LV end diastolic pressure is mildly elevated.  There is no aortic valve stenosis.  No angiographically apparent coronary artery disease.  Tortuous right subclavian requiring Glide wire. Would not use right radial approach if cath was needed in the future.  Continue aggressive medical therapy and blood pressure control.      Patient Profile     51 y.o. female  hx of hypertensive cardiomyopathy, uncontrolled hypertension, medication noncompliance, normal coronary arteries 07/2016, recent thalamic CVA, with OP rehab, CKD, type II diabetes, ETOH abuse, anxiety, depression, panic attacks, GIB, and  COPDwho was admitted with leg cellulitis. Cardiology is following for medication adjustments in the setting of cardiomyopathy and recently documented atrial tachycardia. Echo demonstrated reduced EF to 35% compared to prior echo at 45%.   Assessment & Plan    1. Paroxysmal Atrial tachycardia: Now on higher dose of coreg at 12.5 mg BID. Still episodes of tachycardia, at HS, which she experiences as dyspnea. Creatinine 1.25 currently. Consider amiodarone vs increasing coreg. BP is stable and can accept higher dose of BB.   2. Nonischemic Cardiomyopathy: Echo  demonstrates EF of 35%, decreased from prior echo. BP is not optimal for reduced EF. Will increase coreg to 25 mg BID, for help with BP and heart rate.   3. Acute on Chronic Mixed CHF: Continues diureses. Will order strict I/o and daily wts. She is overall better concerning volume overload since admission. She is on lasix 20 mg daily with low sodium diet. She remains on lisinopril recently started on admission. Creatinine 1.25 this am.   4.  Bilateral LEE Cellulitis: Followed by PCP team and currently on antibiotic therapy with wound care.   5. Hypertension: BP is not optimal for reduced EF. Adjustments as above.   Signed, Phill Myron. Lawrence DNP, ANP, AACC   08/05/2017, 8:32 AM    Doing well pain in legs better. Wounds look less erythematous. Telemetry review with bursts of atrial tachycardia That are asymptomatic Titrate beta blocker for now and consider Amiodarone if persistent after 2 weeks of higher dose beta blocker Ok to go to [facility] when bed available and we can arrange outpatient f/u with Dr Bronson Ing and possibly EP  Jenkins Rouge

## 2017-08-05 NOTE — Care Management Note (Signed)
Case Management Note  Patient Details  Name: [name] MRN: [id number] Date of Birth: January 21, 1966  Subjective/Objective:                  Admitted with wounds to BLE.  From home, active with [facility], wanting SNF at DC. PT has recommended SNF.    Action/Plan: Plan for DC to SNF. CSW has made placement arrangement. [facility] rep is aware of DC plan. CM signing off.   Expected Discharge Date:                  Expected Discharge Plan:  [place]  In-House Referral:  Clinical Social Work  Discharge planning Services  CM Consult  Post Acute Care Choice:  NA Choice offered to:  NA  Status of Service:  Completed, signed off  Sherald Barge, RN 08/05/2017, 3:23 PM

## 2017-08-06 ENCOUNTER — Inpatient Hospital Stay
Admission: RE | Admit: 2017-08-06 | Discharge: 2017-08-27 | Disposition: A | Discharge status: Home / Self Care | Payer: Medicare Other | Source: Ambulatory Visit | Attending: Internal Medicine | Admitting: Internal Medicine

## 2017-08-06 DIAGNOSIS — I6330 Cerebral infarction due to thrombosis of unspecified cerebral artery: Secondary | ICD-10-CM | POA: Diagnosis not present

## 2017-08-06 DIAGNOSIS — L97919 Non-pressure chronic ulcer of unspecified part of right lower leg with unspecified severity: Secondary | ICD-10-CM

## 2017-08-06 DIAGNOSIS — I340 Nonrheumatic mitral (valve) insufficiency: Secondary | ICD-10-CM | POA: Diagnosis not present

## 2017-08-06 DIAGNOSIS — R197 Diarrhea, unspecified: Secondary | ICD-10-CM | POA: Diagnosis not present

## 2017-08-06 DIAGNOSIS — I509 Heart failure, unspecified: Secondary | ICD-10-CM | POA: Diagnosis not present

## 2017-08-06 DIAGNOSIS — R000 Tachycardia, unspecified: Secondary | ICD-10-CM | POA: Diagnosis not present

## 2017-08-06 DIAGNOSIS — M79604 Pain in right leg: Secondary | ICD-10-CM | POA: Diagnosis not present

## 2017-08-06 DIAGNOSIS — S92514D Nondisplaced fracture of proximal phalanx of right lesser toe(s), subsequent encounter for fracture with routine healing: Secondary | ICD-10-CM | POA: Diagnosis not present

## 2017-08-06 DIAGNOSIS — L03115 Cellulitis of right lower limb: Secondary | ICD-10-CM | POA: Diagnosis not present

## 2017-08-06 DIAGNOSIS — I471 Supraventricular tachycardia: Secondary | ICD-10-CM | POA: Diagnosis not present

## 2017-08-06 DIAGNOSIS — D631 Anemia in chronic kidney disease: Secondary | ICD-10-CM | POA: Diagnosis not present

## 2017-08-06 DIAGNOSIS — I639 Cerebral infarction, unspecified: Secondary | ICD-10-CM | POA: Diagnosis not present

## 2017-08-06 DIAGNOSIS — I10 Essential (primary) hypertension: Secondary | ICD-10-CM | POA: Diagnosis not present

## 2017-08-06 DIAGNOSIS — R262 Difficulty in walking, not elsewhere classified: Secondary | ICD-10-CM | POA: Diagnosis not present

## 2017-08-06 DIAGNOSIS — R0602 Shortness of breath: Secondary | ICD-10-CM | POA: Diagnosis not present

## 2017-08-06 DIAGNOSIS — M6281 Muscle weakness (generalized): Secondary | ICD-10-CM | POA: Diagnosis not present

## 2017-08-06 DIAGNOSIS — F1910 Other psychoactive substance abuse, uncomplicated: Secondary | ICD-10-CM | POA: Diagnosis not present

## 2017-08-06 DIAGNOSIS — I110 Hypertensive heart disease with heart failure: Secondary | ICD-10-CM | POA: Diagnosis not present

## 2017-08-06 DIAGNOSIS — D473 Essential (hemorrhagic) thrombocythemia: Secondary | ICD-10-CM | POA: Diagnosis not present

## 2017-08-06 DIAGNOSIS — D649 Anemia, unspecified: Secondary | ICD-10-CM | POA: Diagnosis not present

## 2017-08-06 DIAGNOSIS — L03116 Cellulitis of left lower limb: Secondary | ICD-10-CM | POA: Diagnosis not present

## 2017-08-06 DIAGNOSIS — E162 Hypoglycemia, unspecified: Secondary | ICD-10-CM | POA: Diagnosis not present

## 2017-08-06 DIAGNOSIS — E785 Hyperlipidemia, unspecified: Secondary | ICD-10-CM | POA: Diagnosis not present

## 2017-08-06 DIAGNOSIS — J441 Chronic obstructive pulmonary disease with (acute) exacerbation: Secondary | ICD-10-CM | POA: Diagnosis not present

## 2017-08-06 DIAGNOSIS — N183 Chronic kidney disease, stage 3 (moderate): Secondary | ICD-10-CM | POA: Diagnosis not present

## 2017-08-06 DIAGNOSIS — I5021 Acute systolic (congestive) heart failure: Secondary | ICD-10-CM | POA: Diagnosis not present

## 2017-08-06 DIAGNOSIS — I5042 Chronic combined systolic (congestive) and diastolic (congestive) heart failure: Secondary | ICD-10-CM | POA: Diagnosis not present

## 2017-08-06 DIAGNOSIS — L97929 Non-pressure chronic ulcer of unspecified part of left lower leg with unspecified severity: Secondary | ICD-10-CM

## 2017-08-06 DIAGNOSIS — J449 Chronic obstructive pulmonary disease, unspecified: Secondary | ICD-10-CM | POA: Diagnosis not present

## 2017-08-06 DIAGNOSIS — E1129 Type 2 diabetes mellitus with other diabetic kidney complication: Secondary | ICD-10-CM | POA: Diagnosis not present

## 2017-08-06 LAB — GLUCOSE, CAPILLARY
Glucose-Capillary: 109 mg/dL — ABNORMAL HIGH (ref 65–99)
Glucose-Capillary: 129 mg/dL — ABNORMAL HIGH (ref 65–99)

## 2017-08-06 MED ORDER — ROSUVASTATIN CALCIUM 40 MG PO TABS: 40.0000 mg | ORAL_TABLET | Freq: Every day | ORAL | Status: DC

## 2017-08-06 MED ORDER — CARVEDILOL 25 MG PO TABS: 25.0000 mg | ORAL_TABLET | Freq: Two times a day (BID) | ORAL | Status: DC

## 2017-08-06 MED ORDER — AMIODARONE HCL 400 MG PO TABS
400.0000 mg | ORAL_TABLET | Freq: Two times a day (BID) | ORAL | 0 refills | Status: DC
Start: 2017-08-06 — End: 2017-08-20

## 2017-08-06 MED ORDER — AMIODARONE HCL 200 MG PO TABS
400.0000 mg | ORAL_TABLET | Freq: Two times a day (BID) | ORAL | Status: DC
  Administered 2017-08-06: 400 mg via ORAL
  Filled 2017-08-06: qty 2

## 2017-08-06 MED ORDER — OXYCODONE HCL 5 MG PO TABS
5.0000 mg | ORAL_TABLET | Freq: Once | ORAL | Status: AC
  Administered 2017-08-06: 5 mg via ORAL
  Filled 2017-08-06: qty 1

## 2017-08-06 MED ORDER — CLINDAMYCIN HCL 300 MG PO CAPS: 300.0000 mg | ORAL_CAPSULE | Freq: Four times a day (QID) | ORAL | 0 refills | Status: AC

## 2017-08-06 MED ORDER — AMIODARONE HCL 200 MG PO TABS: 200.0000 mg | ORAL_TABLET | Freq: Two times a day (BID) | ORAL | 2 refills | Status: DC

## 2017-08-06 MED ORDER — LISINOPRIL 5 MG PO TABS: 5.0000 mg | ORAL_TABLET | Freq: Every day | ORAL | Status: DC

## 2017-08-06 MED ORDER — OXYCODONE-ACETAMINOPHEN 5-325 MG PO TABS: 1.0000 | ORAL_TABLET | Freq: Four times a day (QID) | ORAL | 0 refills | Status: DC | PRN

## 2017-08-06 NOTE — Clinical Social Work Placement (Addendum)
   CLINICAL SOCIAL WORK PLACEMENT  NOTE  Date:  08/06/2017  Patient Details  Name: [name] MRN: [id number] Date of Birth: [birth year]  Clinical Social Work is seeking post-discharge placement for this patient at the [place] level of care (*CSW will initial, date and re-position this form in  chart as items are completed):  Yes   Patient/family provided with [place] Work Department's list of facilities offering this level of care within the geographic area requested by the patient (or if unable, by the patient's family).  Yes   Patient/family informed of their freedom to choose among providers that offer the needed level of care, that participate in Medicare, Medicaid or managed care program needed by the patient, have an available bed and are willing to accept the patient.  Yes   Patient/family informed of [facility]'s ownership interest in [facility] and [facility], as well as of the fact that they are under no obligation to receive care at these facilities.  PASRR submitted to EDS on       PASRR number received on       Existing PASRR number confirmed on 08/04/17     FL2 transmitted to all facilities in geographic area requested by pt/family on 08/04/17     FL2 transmitted to all facilities within larger geographic area on       Patient informed that his/her managed care company has contracts with or will negotiate with certain facilities, including the following:            Patient/family informed of bed offers received.  Patient chooses bed at [facility]     Physician recommends and patient chooses bed at      Patient to be transferred to [facility] on 08/06/17.  Patient to be transferred to facility by [facility] staff     Patient family notified on 08/06/17 of transfer.  Name of family member notified:  [name], sister, was at bedside assisting patient in getting ready.     PHYSICIAN        Additional Comment: Clinicals sent via [employer]. Facility aware. LCSW signing off.    _______________________________________________ Ihor Gully, LCSW 08/06/2017, 3:03 PM

## 2017-08-06 NOTE — Progress Notes (Signed)
0500 (daily) dressing changed as completed by this RN not completed. -patient's dressing last changed at 1330 on 10/11 Next shift will be alerted to the need for a dressing change to be completed at 1330 on today.

## 2017-08-06 NOTE — Progress Notes (Signed)
Progress Note  Patient Name: [name] Date of Encounter: 08/06/2017  Primary Cardiologist: Kate Sable MD  Subjective  Complaints of leg pain not controlled with medications. Still feels HR racing.    Inpatient Medications    Scheduled Meds: . albuterol  2.5 mg Nebulization TID  . aspirin EC  81 mg Oral Daily  . carvedilol  25 mg Oral BID WC  . cloNIDine  0.2 mg Oral QHS  . clopidogrel  75 mg Oral Q breakfast  . collagenase   Topical Daily  . ezetimibe  10 mg Oral Daily  . FLUoxetine  20 mg Oral Daily  . fluticasone  2 spray Each Nare QHS  . furosemide  20 mg Oral Daily  . gabapentin  100 mg Oral TID  . heparin  5,000 Units Subcutaneous Q8H  . insulin aspart  0-15 Units Subcutaneous TID WC  . insulin aspart  0-5 Units Subcutaneous QHS  . lisinopril  5 mg Oral Daily  . montelukast  10 mg Oral QHS  . oxyCODONE  5 mg Oral Once  . pantoprazole  40 mg Oral Daily  . potassium chloride SA  20 mEq Oral Daily  . rosuvastatin  40 mg Oral Daily  . tiotropium  18 mcg Inhalation Daily  . traZODone  50 mg Oral QHS   Continuous Infusions: . clindamycin (CLEOCIN) IV 600 mg (08/06/17 0615)   PRN Meds: ipratropium-albuterol, metoprolol tartrate, ondansetron **OR** ondansetron (ZOFRAN) IV, oxyCODONE-acetaminophen   Vital Signs    Vitals:   08/05/17 2010 08/05/17 2054 08/06/17 0528 08/06/17 0811  BP:  (!) 127/96 (!) 146/91   Pulse: (!) 107 (!) 112 97   Resp: 16 16 16    Temp:  97.9 F (36.6 C) 98.1 F (36.7 C)   TempSrc:  Oral Oral   SpO2: 98% 97% 98% (!) 63%  Weight:   132 lb 0.9 oz (59.9 kg)   Height:        Intake/Output Summary (Last 24 hours) at 08/06/17 0900 Last data filed at 08/05/17 2054  Gross per 24 hour  Intake                0 ml  Output              550 ml  Net             -550 ml   Filed Weights   08/02/17 1738 08/03/17 0125 08/06/17 0528  Weight: 133 lb (60.3 kg) 133 lb (60.3 kg) 132 lb 0.9 oz (59.9 kg)    Telemetry    SR with  episodes of atrial tachycardia, HR 150's.  Personally Reviewed  Physical Exam   GEN: No acute distress.   Neck: No JVD Cardiac: IRRR, with tachycardia, no murmurs, rubs, or gallops.  Respiratory: Clear to auscultation bilaterally. GI: Soft, nontender, non-distended  MS: Mild edema with less erythema around her LEE wounds. Painful to palpation.  Neuro:  Nonfocal  Psych: Normal affect   Labs    Chemistry Recent Labs Lab 08/03/17 0450 08/04/17 0958 08/05/17 0437  NA 141 143 139  K 3.5 4.5 4.6  CL 106 108 107  CO2 23 23 23   GLUCOSE 118* 155* 160*  BUN 20 22* 22*  CREATININE 1.21* 1.31* 1.25*  CALCIUM 8.8* 9.0 8.8*  PROT 6.3*  --   --   ALBUMIN 2.3*  --   --   AST 18  --   --   ALT 17  --   --  ALKPHOS 48  --   --   BILITOT 0.8  --   --   GFRNONAA 50* 46* 48*  GFRAA 58* 53* 56*  ANIONGAP 12 12 9      Hematology Recent Labs Lab 08/02/17 2108 08/03/17 0450 08/03/17 1437 08/04/17 0958  WBC 13.0* 10.5  --  9.4  RBC 3.41* 3.01* 3.17* 3.33*  HGB 10.4* 9.3*  --  10.2*  HCT 33.9* 29.8*  --  32.8*  MCV 99.4 99.0  --  98.5  MCH 30.5 30.9  --  30.6  MCHC 30.7 31.2  --  31.1  RDW 15.1 15.1  --  15.2  PLT 465* 401*  --  400     Radiology    US Arterial Abi (screening Lower Extremity)  Result Date: 08/04/2017 CLINICAL DATA:  51 year old female with a history of leg wounds. Cardiovascular risk factors include smoking, hypertension, hyperlipidemia, diabetes EXAM: NONINVASIVE PHYSIOLOGIC VASCULAR STUDY OF BILATERAL LOWER EXTREMITIES TECHNIQUE: Evaluation of both lower extremities was performed at rest, including calculation of ankle-brachial indices, multiple segmental pressure evaluation, segmental Doppler and segmental pulse volume recording. COMPARISON:  None. FINDINGS: Right ABI:  Not obtained Left ABI:  1.18 Right Lower Extremity: Segmental Doppler of the right lower extremity demonstrates probable monophasic signal Left Lower Extremity: Segmental Doppler of the left  lower extremity demonstrates probable monophasic signal IMPRESSION: Right: Resting ankle-brachial index on the right was not obtained. Segmental Doppler suggests monophasic signal at the ankle, indicating more proximal arterial occlusive disease. If there is concern for anatomic imaging, CT angiogram and runoff may be considered. Left: Resting ankle-brachial index within normal limits, though potentially falsely elevated secondary to noncompressible vessels, as the segmental Doppler suggests proximal vascular disease. Again, if there is concern for anatomic imaging, CT angiogram and runoff may be useful. Signed, Dulcy Fanny. Earleen Newport, DO [facility] Radiology Electronically Signed   By: Corrie Mckusick D.O.   On: 08/04/2017 15:36    Cardiac Studies   Echocardiogram8/28/18: Study Conclusions  - Left ventricle: The cavity size was normal. Wall thickness was increased in a pattern of moderate LVH. Systolic function was moderately to severely reduced. The estimated ejection fraction was in the range of 30% to 35%. Diffuse hypokinesis. Features are consistent with a pseudonormal left ventricular filling pattern, with concomitant abnormal relaxation and increased filling pressure (grade 2 diastolic dysfunction). - Mitral valve: There was moderate regurgitation directed posteriorly. - Left atrium: The atrium was mildly dilated.  Cardiac catheterization 07/2016:   LV end diastolic pressure is mildly elevated.  There is no aortic valve stenosis.  No angiographically apparent coronary artery disease.  Tortuous right subclavian requiring Glide wire. Would not use right radial approach if cath was needed in the future.  Continue aggressive medical therapy and blood pressure control.       Patient Profile     51 y.o. female hx of hypertensive cardiomyopathy, uncontrolled hypertension, medication noncompliance, normal coronary  arteries 07/2016,recent thalamic CVA, with OP rehab, CKD, type II diabetes, ETOH abuse, anxiety, depression, panic attacks, GIB, and COPDwho was admitted with leg cellulitis. Cardiology is following for medication adjustments in the setting of cardiomyopathy and recently documented atrial tachycardia. Echo demonstrated reduced EF to 35% compared to prior echo at 45%.  Assessment & Plan    1. Paroxysmal Atrial Tachycardia: Still having episodes of atrial tachycardia in bursts. She is currently on coreg 25 mg BID. Consider adding amiodarone 200 mg BID, low dose loading,  while admitted as she  is symptomatic with elevated HR to ascertain HR control before discharge.    2. Nonischemic Cardiomyopathy: Echo revealed EF of 35% which is down from prior study at 45%. BP is still elevated despite increased dose of BB. Not optimal with low EF. Increase lisinopril to 10 mg daily.  Consider Entresto. Affordability can be managed with medication assistance    3. Acute on Chronic Mixed CHF: Does not appear to be volume overloaded at this time. Some edema around her leg wounds only. She will continue on lasix. Creatinine 1.25.   4. HTN: Not optimal for reduced EF. Increasing lisinopril to 10 mg daily. Continue BB, clonidine. Follow with titrated doses of lisinopril.   5. Bilateral LEE Cellulitis:  Continues on IV Cleocin per PCP   Appointment has been made for follow up with our office in [place] for Aug 23, 2017. This is in discharge plan documentation. Baldwin [place]. Lawrence DNP, ANP, AACC   08/06/2017, 9:00 AM    Her baseline HR on telemetry is better in 90's For the most part she does not notice her atrial runs Reasonable to start low dose amiodarone and have her f/u with EP at [facility] post d/c Dr Lovena Le comes To office here. Wounds looking some better appears euvolemic. Cr stable Increase ACE today  Jenkins Rouge

## 2017-08-06 NOTE — Care Management Important Message (Signed)
Important Message  Patient Details  Name: [name] MRN: [id number] Date of Birth: October 29, 1965   Medicare Important Message Given:  Yes    Sherald Barge, RN 08/06/2017, 11:28 AM

## 2017-08-06 NOTE — Progress Notes (Signed)
PT Cancellation Note  Patient Details Name: LAURIEANNE GALLOWAY MRN: [id number] DOB: 1966/07/16   Cancelled Treatment:    Reason Eval/Treat Not Completed: Patient declined, no reason specified.  Patient declined therapy secondary to going to SNF today.   3:26 PM, 08/06/17 Lonell Grandchild, MPT Physical Therapist with [facility] [phone] office [phone] mobile phone

## 2017-08-06 NOTE — Discharge Summary (Signed)
Physician Discharge Summary  Barbara WOOLLARD WNI:[id number] DOB: 1965-12-16 DOA: 08/02/2017  PCP: [name], MD  Admit date: 08/02/2017 Discharge date: 08/06/2017  Time spent: 35 minutes  Recommendations for Outpatient Follow-up:  1. Follow-up cardiology on 08/20/2017 2. Amiodarone 400 mg by mouth twice daily for 1 week, stop on 08/13/2017 3. Amiodarone 200 mg by mouth twice a day from 08/14/2017   Wound care instructions  Recommend Xeroform gauze and Santyl ointment for enzymatic debridement. Also recommend patient would benefit from sharp debridement at outpatient wound care center.   Discharge Diagnoses:  Principal Problem:   Cellulitis of both lower extremities Active Problems:   HTN (hypertension), malignant   CKD (chronic kidney disease) stage 3, GFR 30-59 ml/min (HCC)   Anxiety and depression   DM (diabetes mellitus), type 2, uncontrolled, with renal complications (HCC)   Cerebral thrombosis with cerebral infarction   COPD (chronic obstructive pulmonary disease) (HCC)   Hyperlipidemia   Anemia   Thrombocytosis (HCC)   Tachyarrhythmia   Discharge Condition: Stable  Diet recommendation:  Carb modified diet  Filed Weights   08/02/17 1738 08/03/17 0125 08/06/17 0528  Weight: 60.3 kg (133 lb) 60.3 kg (133 lb) 59.9 kg (132 lb 0.9 oz)    History of present illness:  51 year old female with history of alcohol use, polysubstance use including cocaine and marijuana, history of anxiety, panic attacks and depression, chronic kidney disease, COPD, uncontrolled type 2 diabetes mellitus which are court foot and diabetic neuropathy, hypertension history of GI bleed and recent thalamic stroke and was discharged to rehabilitation and then sent home presented to the ED with bilateral lower leg blisters. X-ray of the foot showed extensive soft tissue swelling with degenerative changes of the foot. Also showed a nondisplaced fracture at the base of the proximal phallus of right  third toe. Patient admitted for bilateral cellulitis of the foot with hospital course prolonged due to tachyarrhythmia.  Hospital Course:  1. Cellulitis of both lower extremities-improved, patient was started on clindamycin. Wound care following.. Pain controlled with Percocet. Patient will be discharged on clindamycin 300 mg by mouth 4 times a day for 7 more days. Continue wound care. 2. Tachyarrhythmia- EKG showed atrial tachycardia, patient started on carvedilol. Cardiology recommends increasing the dose of carvedilol to 25 mg by mouth daily, also started on amiodarone 400 mg by mouth twice a day for 7 days stop on 08/13/2017, and start amiodarone 200 mg by mouth twice a day from 08/14/2017. 3. Acute on chronic combined systolic and diastolic CHF- currently euvolemic, continue Lasix 20 mg by mouth daily.  4. Hypertension- continue clonidine, carvedilol. 5. Nonischemic cardio myopathy- EF 35%, patient started on Coreg 25 mg by mouth twice a day. 6. CKD stage 3- creatinine is stable at 1.25. 7. Diabetes mellitus type 2 with renal complications-continue home regimen 8. Cerebral thrombosis with cerebral infarction-stable, continue low-dose aspirin and Plavix 9. COPD-stable continue DuoNeb's every 6 hours when necessary, Spiriva, Singulair 10. Chronic anemia-hemoglobin is 10.2, stable. Iron level 36, saturation is a 12% .  mild iron deficiency anemia. Stable.   Procedures:  *None  Consultations:  Cardiology  Discharge Exam: Vitals:   08/06/17 0811 08/06/17 1317  BP:    Pulse:    Resp:    Temp:    SpO2: 96% 90%    General: Appears in no acute distress Cardiovascular: S1-S2, regular Respiratory: Clear to auscultation bilaterally  Discharge Instructions   Discharge Instructions    AMB Referral to [facility]  Complete by:  As directed    Patient is currently active with [facility]. Please assign patient for community nurse to engage for transition of care calls  and evaluate for monthly home visits. For questions please contact:   Janci Minor RN, [facility] Liaison [phone])   Reason for consult:  Post hospital follow up with RN Community Case Manager   Expected date of contact:  1-3 days (reserved for hospital discharges)   Diet - low sodium heart healthy    Complete by:  As directed    Increase activity slowly    Complete by:  As directed      Current Discharge Medication List    START taking these medications   Details  !! amiodarone (PACERONE) 200 MG tablet Take 1 tablet (200 mg total) by mouth 2 (two) times daily. Qty: 60 tablet, Refills: 2    !! amiodarone (PACERONE) 400 MG tablet Take 1 tablet (400 mg total) by mouth 2 (two) times daily. Qty: 14 tablet, Refills: 0    carvedilol (COREG) 25 MG tablet Take 1 tablet (25 mg total) by mouth 2 (two) times daily with a meal.    clindamycin (CLEOCIN) 300 MG capsule Take 1 capsule (300 mg total) by mouth 4 (four) times daily. Qty: 28 capsule, Refills: 0    lisinopril (PRINIVIL,ZESTRIL) 5 MG tablet Take 1 tablet (5 mg total) by mouth daily.    oxyCODONE-acetaminophen (PERCOCET/ROXICET) 5-325 MG tablet Take 1 tablet by mouth every 6 (six) hours as needed for moderate pain. Qty: 30 tablet, Refills: 0     !! - Potential duplicate medications found. Please discuss with provider.    CONTINUE these medications which have CHANGED   Details  rosuvastatin (CRESTOR) 40 MG tablet Take 1 tablet (40 mg total) by mouth daily.      CONTINUE these medications which have NOT CHANGED   Details  aspirin EC 81 MG EC tablet Take 1 tablet (81 mg total) by mouth daily. Qty: 30 tablet, Refills: 0    Azelastine HCl (ASTEPRO) 0.15 % SOLN Place 2 sprays into the nose every 12 (twelve) hours as needed (for allergies).    cloNIDine (CATAPRES) 0.2 MG tablet TAKE (1) TABLET BY MOUTH AT BEDTIME FOR BLOOD PRESSURE. Qty: 90 tablet, Refills: 1    clopidogrel (PLAVIX) 75 MG tablet TAKE 1 TABLET BY  MOUTH DAILY WITH BREAKFAST. Qty: 30 tablet, Refills: 0    collagenase (SANTYL) ointment Apply 1 application topically daily. To affected area    ezetimibe (ZETIA) 10 MG tablet Take 1 tablet (10 mg total) by mouth daily. Qty: 90 tablet, Refills: 1    fenofibrate micronized (LOFIBRA) 200 MG capsule Take 200 mg by mouth daily before breakfast.    FLUoxetine (PROZAC) 20 MG capsule TAKE 1 CAPSULE BY MOUTH EVERY DAY. Qty: 30 capsule, Refills: 0    fluticasone (FLONASE) 50 MCG/ACT nasal spray Place 2 sprays into both nostrils daily. Qty: 48 g, Refills: 1    folic acid (FOLVITE) 1 MG tablet Take 1 tablet (1 mg total) by mouth daily. Qty: 30 tablet, Refills: 0    furosemide (LASIX) 20 MG tablet TAKE 1 TABLET BY MOUTH DAILY. Qty: 30 tablet, Refills: 0    gabapentin (NEURONTIN) 100 MG capsule TAKE 1 CAPSULE BY MOUTH THREE TIMES A DAY. Qty: 90 capsule, Refills: 0    hydrocortisone valerate cream (WESTCORT) 0.2 % Apply topically 2 (two) times daily. Qty: 45 g, Refills: 0    Insulin Glargine (LANTUS SOLOSTAR)  100 UNIT/ML Solostar Pen Inject 10 Units into the skin daily at 10 pm.    montelukast (SINGULAIR) 10 MG tablet TAKE (1) TABLET BY MOUTH AT BEDTIME. Qty: 30 tablet, Refills: 0    Multiple Vitamin (MULTIVITAMIN WITH MINERALS) TABS tablet Take 1 tablet by mouth daily. Qty: 30 tablet, Refills: 0    pantoprazole (PROTONIX) 40 MG tablet Take 1 tablet (40 mg total) by mouth daily. Qty: 90 tablet, Refills: 1    potassium chloride SA (K-DUR,KLOR-CON) 20 MEQ tablet TAKE 1&1/2 TABLET BY MOUTH DAILY. Qty: 45 tablet, Refills: 0    PROAIR HFA 108 (90 Base) MCG/ACT inhaler INHALE 2 PUFFS EVERY 6 HOURS AS NEEDED FOR SHORTNESS OF BREATH/WHEEZING. Qty: 8.5 g, Refills: 0    repaglinide (PRANDIN) 1 MG tablet TAKE 1 TABLET BY MOUTH 3 TIMES DAILY BEFORE MEALS. Qty: 270 tablet, Refills: 1   Associated Diagnoses: Diabetes mellitus type 2 in obese (HCC)    thiamine 100 MG tablet Take 1 tablet (100 mg  total) by mouth daily. Qty: 30 tablet, Refills: 0    TRADJENTA 5 MG TABS tablet TAKE 1 TABLET BY MOUTH ONCE A DAY. Qty: 30 tablet, Refills: 0    traZODone (DESYREL) 50 MG tablet TAKE (1) TABLET BY MOUTH AT BEDTIME. Qty: 30 tablet, Refills: 0    Vitamin D, Ergocalciferol, (DRISDOL) 50000 units CAPS capsule Take 1 capsule (50,000 Units total) by mouth once a week. Qty: 12 capsule, Refills: 1    insulin aspart (NOVOLOG) 100 UNIT/ML injection Inject 0-9 Units into the skin 3 (three) times daily with meals. Qty: 10 mL, Refills: 11    ipratropium-albuterol (DUONEB) 0.5-2.5 (3) MG/3ML SOLN Take 3 mLs by nebulization every 6 (six) hours as needed. Qty: 360 mL, Refills: 1      STOP taking these medications     amLODipine (NORVASC) 10 MG tablet      atorvastatin (LIPITOR) 80 MG tablet      bisoprolol (ZEBETA) 5 MG tablet      Choline Fenofibrate 135 MG capsule      SPIRIVA HANDIHALER 18 MCG inhalation capsule        No Known Allergies  Contact information for follow-up providers    Dunn, Dayna N, PA-C Follow up on 08/20/2017.   Specialties:  Cardiology, Radiology Why:  1:30 pm Bring all medications iwth you to appointment  Contact information: [place] [address] [phone]            Contact information for after-discharge care    [facility] Follow up.   Specialty:  Skilled Nursing Facility Contact information: [address] [place] [phone]                   The results of significant diagnostics from this hospitalization (including imaging, microbiology, ancillary and laboratory) are listed below for reference.    Significant Diagnostic Studies: US Arterial Abi (screening Lower Extremity)  Result Date: 08/04/2017 CLINICAL DATA:  50 year old female with a history of leg wounds. Cardiovascular risk factors include smoking, hypertension, hyperlipidemia, diabetes EXAM:  NONINVASIVE PHYSIOLOGIC VASCULAR STUDY OF BILATERAL LOWER EXTREMITIES TECHNIQUE: Evaluation of both lower extremities was performed at rest, including calculation of ankle-brachial indices, multiple segmental pressure evaluation, segmental Doppler and segmental pulse volume recording. COMPARISON:  None. FINDINGS: Right ABI:  Not obtained Left ABI:  1.18 Right Lower Extremity: Segmental Doppler of the right lower extremity demonstrates probable monophasic signal Left Lower Extremity: Segmental  Doppler of the left lower extremity demonstrates probable monophasic signal IMPRESSION: Right: Resting ankle-brachial index on the right was not obtained. Segmental Doppler suggests monophasic signal at the ankle, indicating more proximal arterial occlusive disease. If there is concern for anatomic imaging, CT angiogram and runoff may be considered. Left: Resting ankle-brachial index within normal limits, though potentially falsely elevated secondary to noncompressible vessels, as the segmental Doppler suggests proximal vascular disease. Again, if there is concern for anatomic imaging, CT angiogram and runoff may be useful. Signed, Dulcy Fanny. Earleen Newport, DO [facility] Electronically Signed   By: Corrie Mckusick D.O.   On: 08/04/2017 15:36   Dg Foot Complete Right  Result Date: 08/02/2017 CLINICAL DATA:  Multiple wounds on her legs, RIGHT foot has begun to have foul smell when home health nurse came to change dressings today EXAM: RIGHT FOOT COMPLETE - 3+ VIEW COMPARISON:  11/11/2009 FINDINGS: Mild diffuse osseous demineralization. Scattered soft tissue swelling. Pes planus. Small plantar calcaneal spur. Ossicle at dorsal margin of the tarsal navicular, appears corticated and old. Nondisplaced fracture identified at base of proximal phalanx third toe. No additional fracture, dislocation, or bone destruction. IMPRESSION: Extensive soft tissue swelling and pes planus with  midfoot degenerative changes. Nondisplaced fracture at base of proximal phalanx RIGHT third toe. Electronically Signed   By: Lavonia Dana M.D.   On: 08/02/2017 23:40    Microbiology: No results found for this or any previous visit (from the past 240 hour(s)).   Labs: Basic Metabolic Panel:  Recent Labs Lab 08/02/17 2108 08/03/17 0450 08/04/17 0958 08/05/17 0437  NA 144 141 143 139  K 3.9 3.5 4.5 4.6  CL 108 106 108 107  CO2 24 23 23 23   GLUCOSE 95 118* 155* 160*  BUN 21* 20 22* 22*  CREATININE 1.26* 1.21* 1.31* 1.25*  CALCIUM 9.5 8.8* 9.0 8.8*  MG 1.7  --   --   --    Liver Function Tests:  Recent Labs Lab 08/03/17 0450  AST 18  ALT 17  ALKPHOS 48  BILITOT 0.8  PROT 6.3*  ALBUMIN 2.3*   No results for input(s): LIPASE, AMYLASE in the last 168 hours. No results for input(s): AMMONIA in the last 168 hours. CBC:  Recent Labs Lab 08/02/17 2108 08/03/17 0450 08/04/17 0958  WBC 13.0* 10.5 9.4  NEUTROABS 10.6* 8.6* 7.6  HGB 10.4* 9.3* 10.2*  HCT 33.9* 29.8* 32.8*  MCV 99.4 99.0 98.5  PLT 465* 401* 400   Cardiac Enzymes: No results for input(s): CKTOTAL, CKMB, CKMBINDEX, TROPONINI in the last 168 hours. BNP: BNP (last 3 results)  Recent Labs  06/21/17 1637 06/22/17 0337  BNP 1,152.0* 835.0*    ProBNP (last 3 results) No results for input(s): PROBNP in the last 8760 hours.  CBG:  Recent Labs Lab 08/05/17 0742 08/05/17 1125 08/05/17 2054 08/06/17 0754 08/06/17 1117  GLUCAP 146* 248* 106* 109* 129*       Signed:  Oswald Hillock MD.  [facility] 08/06/2017, 2:34 PM

## 2017-08-09 ENCOUNTER — Other Ambulatory Visit (HOSPITAL_COMMUNITY)
Admission: RE | Admit: 2017-08-09 | Discharge: 2017-08-09 | Disposition: A | Discharge status: Home / Self Care | Payer: Medicare Other | Source: Skilled Nursing Facility | Attending: Internal Medicine | Admitting: Internal Medicine

## 2017-08-09 ENCOUNTER — Encounter: Payer: Self-pay | Admitting: Internal Medicine

## 2017-08-09 ENCOUNTER — Non-Acute Institutional Stay (SKILLED_NURSING_FACILITY): Payer: Medicare Other | Admitting: Internal Medicine

## 2017-08-09 DIAGNOSIS — F329 Major depressive disorder, single episode, unspecified: Secondary | ICD-10-CM

## 2017-08-09 DIAGNOSIS — N183 Chronic kidney disease, stage 3 unspecified: Secondary | ICD-10-CM

## 2017-08-09 DIAGNOSIS — R000 Tachycardia, unspecified: Secondary | ICD-10-CM | POA: Diagnosis not present

## 2017-08-09 DIAGNOSIS — D649 Anemia, unspecified: Secondary | ICD-10-CM | POA: Insufficient documentation

## 2017-08-09 DIAGNOSIS — J441 Chronic obstructive pulmonary disease with (acute) exacerbation: Secondary | ICD-10-CM

## 2017-08-09 DIAGNOSIS — E785 Hyperlipidemia, unspecified: Secondary | ICD-10-CM | POA: Diagnosis not present

## 2017-08-09 DIAGNOSIS — I5021 Acute systolic (congestive) heart failure: Secondary | ICD-10-CM

## 2017-08-09 DIAGNOSIS — F1910 Other psychoactive substance abuse, uncomplicated: Secondary | ICD-10-CM | POA: Diagnosis not present

## 2017-08-09 DIAGNOSIS — L03116 Cellulitis of left lower limb: Secondary | ICD-10-CM

## 2017-08-09 DIAGNOSIS — E1165 Type 2 diabetes mellitus with hyperglycemia: Secondary | ICD-10-CM

## 2017-08-09 DIAGNOSIS — F32A Depression, unspecified: Secondary | ICD-10-CM

## 2017-08-09 DIAGNOSIS — L97919 Non-pressure chronic ulcer of unspecified part of right lower leg with unspecified severity: Secondary | ICD-10-CM

## 2017-08-09 DIAGNOSIS — F419 Anxiety disorder, unspecified: Secondary | ICD-10-CM

## 2017-08-09 DIAGNOSIS — L03115 Cellulitis of right lower limb: Secondary | ICD-10-CM

## 2017-08-09 DIAGNOSIS — L97929 Non-pressure chronic ulcer of unspecified part of left lower leg with unspecified severity: Secondary | ICD-10-CM

## 2017-08-09 DIAGNOSIS — E1129 Type 2 diabetes mellitus with other diabetic kidney complication: Secondary | ICD-10-CM

## 2017-08-09 DIAGNOSIS — IMO0002 Reserved for concepts with insufficient information to code with codable children: Secondary | ICD-10-CM

## 2017-08-09 LAB — CBC WITH DIFFERENTIAL/PLATELET
Basophils Absolute: 0 10*3/uL (ref 0.0–0.1)
Basophils Relative: 0 %
Eosinophils Absolute: 0.1 10*3/uL (ref 0.0–0.7)
Eosinophils Relative: 1 %
HCT: 32.3 % — ABNORMAL LOW (ref 36.0–46.0)
Hemoglobin: 10 g/dL — ABNORMAL LOW (ref 12.0–15.0)
Lymphocytes Relative: 14 %
Lymphs Abs: 1.5 10*3/uL (ref 0.7–4.0)
MCH: 30.5 pg (ref 26.0–34.0)
MCHC: 31 g/dL (ref 30.0–36.0)
MCV: 98.5 fL (ref 78.0–100.0)
Monocytes Absolute: 0.8 10*3/uL (ref 0.1–1.0)
Monocytes Relative: 8 %
Neutro Abs: 8.2 10*3/uL — ABNORMAL HIGH (ref 1.7–7.7)
Neutrophils Relative %: 77 %
Platelets: 427 10*3/uL — ABNORMAL HIGH (ref 150–400)
RBC: 3.28 MIL/uL — ABNORMAL LOW (ref 3.87–5.11)
RDW: 15.9 % — ABNORMAL HIGH (ref 11.5–15.5)
WBC: 10.6 10*3/uL — ABNORMAL HIGH (ref 4.0–10.5)

## 2017-08-09 LAB — BASIC METABOLIC PANEL
Anion gap: 9 (ref 5–15)
BUN: 25 mg/dL — ABNORMAL HIGH (ref 6–20)
CO2: 28 mmol/L (ref 22–32)
Calcium: 8.9 mg/dL (ref 8.9–10.3)
Chloride: 102 mmol/L (ref 101–111)
Creatinine, Ser: 1.5 mg/dL — ABNORMAL HIGH (ref 0.44–1.00)
GFR calc Af Amer: 45 mL/min — ABNORMAL LOW (ref >60)
GFR calc non Af Amer: 39 mL/min — ABNORMAL LOW (ref >60)
Glucose, Bld: 152 mg/dL — ABNORMAL HIGH (ref 65–99)
Potassium: 4.1 mmol/L (ref 3.5–5.1)
Sodium: 139 mmol/L (ref 135–145)

## 2017-08-09 NOTE — Progress Notes (Addendum)
Provider:  Veleta Miners Location:   [facility] Room Number: 121/D Place of Service:  SNF (31)  PCP: [name], MD Patient Care Team: [name], MD as PCP - [name], DO (Optometry) Standley Brooking, LCSW as [facility] (Licensed Clinical Social Worker)  Extended Emergency Contact Information Primary Emergency Contact: Herbin,[name] Address: [address]          [place], [place] [address] [place] Phone: [phone] Mobile Phone: [phone] Relation: Son Secondary Emergency Contact: Herbin,[name] Address: [address]          [place], [place] [address] [place] Phone: [phone] Relation: Daughter  Code Status: Full Code Goals of Care: Advanced Directive information Advanced Directives 08/09/2017  Does Patient Have a Medical Advance Directive? Yes  Type of Advance Directive (No Data)  Does patient want to make changes to medical advance directive? No - Patient declined  Would patient like information on creating a medical advance directive? No - Patient declined  Pre-existing out of facility DNR order (yellow form or pink MOST form) -  Some encounter information is confidential and restricted. Go to Review Flowsheets activity to see all data.      Chief Complaint  Patient presents with  . New Admit To SNF    New Admission Visit    HPI: Patient is a 51 y.o. female seen today for admission to SNF for therapy and wound care.  She has h/o Alcohol Abuse, Polysubstance abuse, Hypertensive Cardiomyopathy with EF of 30-35% in 08/18, Recent Thalamic CVA in 08/18, Diabetes Type 2 uncontrolled with Neuropathy, COPD , Non Compliance and Depression, Chronic Kidney Disease with Baseline creat of 1.8, Hyperlipidemia  Patient with Number of medical issues. She was in the hospital from 06/21/17 with Respiratory Failure, Acute CVA, CHF. She was discharged to  SNF on 06/30/17. She does say she had stopped taking all her meds and was also abusing drugs in the motel she was living in.  . Patient says there she developed Swelling in her Legs and ulcers in SNF.  She was discharged home and Nurse at home noticed that she was developing Foul smell form her Wound. She was admitted to the Hospital on 08/02/17. She was treated with IV antibiotics and Wound care. She was eventually changed to PO antibiotics.  Her Hospital course was complicated with Tachyarrhythmia. She was started on Coreg. Loading dose of Amiodarone and Lisinopril for her Cardiomyopathy. She was discharged to the facility for Therapy and wound care.  Patient Doing well in facility. Her main complain is pain and tingling in her lega with inability to sleep. She also C/o SOB . Some cough with Productive sputum. No Fever or chills.  Patient was living with her son after discharge from SNF. She says she wants to eventually live by herself. She says she does not walk much at home. Was living in [place] before going to SNF.   Past Medical History:  Diagnosis Date  . Alcohol use   . Ankle fracture, lateral malleolus, closed 12/30/2011  . Anxiety   . Breast mass, left 12/15/2011  . CKD (chronic kidney disease)   . COPD (chronic obstructive pulmonary disease) ([facility])   . Diabetes mellitus, type 2 ([facility])   . Diabetic Charcot foot ([facility]) 12/30/2011  . Essential hypertension   . History of GI bleed 12/05/2009   Qualifier: Diagnosis of  By: Zeb Comfort    . Hyperlipidemia   .  Hypertensive cardiomyopathy ([facility]) 11/08/2012   a. 05/2016: echo showing EF of 20-25% with normal cors by cath in 07/2016.  Marland Kitchen Noncompliance with medication regimen   . Obesity   . Panic attacks   . Tobacco abuse    Past Surgical History:  Procedure Laterality Date  . BREAST BIOPSY    . CARDIAC CATHETERIZATION N/A 07/28/2016   Procedure: Left Heart Cath and Coronary Angiography;  Surgeon: Jettie Booze, MD;  Location: [facility];  Service: Cardiovascular;  Laterality: N/A;  . DILATION AND CURETTAGE OF UTERUS      reports that she has been smoking Cigarettes.  She has a 20.00 pack-year smoking history. She has never used smokeless tobacco. She reports that she uses drugs, including Marijuana and Cocaine, about 3 times per week. She reports that she does not drink alcohol. Social History   Social History  . Marital status: Widowed    Spouse name: N/A  . Number of children: 2  . Years of education: N/A   Occupational History  . Not on file.   Social History Main Topics  . Smoking status: Current Every Day Smoker    Packs/day: 1.00    Years: 20.00    Types: Cigarettes    Last attempt to quit: 06/03/2016  . Smokeless tobacco: Never Used  . Alcohol use No     Comment: Quit in 2017  . Drug use: Yes    Frequency: 3.0 times per week    Types: Marijuana, Cocaine  . Sexual activity: Not Currently    Birth control/ protection: None   Other Topics Concern  . Not on file   Social History Narrative   Currently living in a motel. Stopped taking all her medicines because she was "fed up"    Functional Status Survey:    Family History  Problem Relation Age of Onset  . Hypertension Mother   . Heart attack Mother   . Hypertension Father        CABG   . Hypertension Sister   . Hypertension Brother   . Hypertension Sister   . Cancer Sister        breast   . Arthritis Unknown   . Cancer Unknown   . Diabetes Unknown   . Asthma Unknown   . Hypertension Daughter   . Hypertension Son     Health Maintenance  Topic Date Due  . INFLUENZA VACCINE  09/25/2017 (Originally 05/26/2017)  . FOOT EXAM  09/25/2017 (Originally 06/18/2017)  . MAMMOGRAM  09/25/2017 (Originally 07/31/2016)  . URINE MICROALBUMIN  09/25/2017 (Originally 09/02/2016)  . COLONOSCOPY  09/25/2017 (Originally 08/23/2016)  . HEMOGLOBIN A1C  12/23/2017  . OPHTHALMOLOGY EXAM  03/16/2018  . PAP SMEAR  05/08/2019  . TETANUS/TDAP   03/15/2022  . PNEUMOCOCCAL POLYSACCHARIDE VACCINE  Completed  . Hepatitis C Screening  Completed  . HIV Screening  Completed    No Known Allergies  Outpatient Encounter Prescriptions as of 08/09/2017  Medication Sig  . [START ON 08/14/2017] amiodarone (PACERONE) 200 MG tablet Take 1 tablet (200 mg total) by mouth 2 (two) times daily.  Marland Kitchen amiodarone (PACERONE) 400 MG tablet Take 1 tablet (400 mg total) by mouth 2 (two) times daily.  Marland Kitchen aspirin EC 81 MG EC tablet Take 1 tablet (81 mg total) by mouth daily.  . Azelastine HCl (ASTEPRO) 0.15 % SOLN Place 2 sprays into the nose every 12 (twelve) hours as needed (for allergies).  . carvedilol (COREG) 25 MG tablet Take 1 tablet (  25 mg total) by mouth 2 (two) times daily with a meal.  . clindamycin (CLEOCIN) 300 MG capsule Take 1 capsule (300 mg total) by mouth 4 (four) times daily.  . cloNIDine (CATAPRES) 0.2 MG tablet TAKE (1) TABLET BY MOUTH AT BEDTIME FOR BLOOD PRESSURE.  . clopidogrel (PLAVIX) 75 MG tablet TAKE 1 TABLET BY MOUTH DAILY WITH BREAKFAST.  . collagenase (SANTYL) ointment Apply 1 application topically daily. To affected area  . ezetimibe (ZETIA) 10 MG tablet Take 1 tablet (10 mg total) by mouth daily.  . fenofibrate micronized (LOFIBRA) 200 MG capsule Take 200 mg by mouth daily before breakfast.  . FLUoxetine (PROZAC) 20 MG capsule TAKE 1 CAPSULE BY MOUTH EVERY DAY.  . fluticasone (FLONASE) 50 MCG/ACT nasal spray Place 2 sprays into both nostrils daily.  . folic acid (FOLVITE) 1 MG tablet Take 1 tablet (1 mg total) by mouth daily.  . furosemide (LASIX) 20 MG tablet TAKE 1 TABLET BY MOUTH DAILY.  Marland Kitchen gabapentin (NEURONTIN) 100 MG capsule TAKE 1 CAPSULE BY MOUTH THREE TIMES A DAY.  Marland Kitchen insulin aspart (NOVOLOG) 100 UNIT/ML injection Inject 0-9 Units into the skin 3 (three) times daily with meals.  . Insulin Glargine (LANTUS SOLOSTAR) 100 UNIT/ML Solostar Pen Inject 10 Units into the skin daily at 10 pm.  . ipratropium-albuterol (DUONEB)  0.5-2.5 (3) MG/3ML SOLN Take 3 mLs by nebulization every 6 (six) hours as needed.  Marland Kitchen lisinopril (PRINIVIL,ZESTRIL) 5 MG tablet Take 1 tablet (5 mg total) by mouth daily.  . montelukast (SINGULAIR) 10 MG tablet TAKE (1) TABLET BY MOUTH AT BEDTIME.  . Multiple Vitamin (MULTIVITAMIN WITH MINERALS) TABS tablet Take 1 tablet by mouth daily.  Marland Kitchen oxyCODONE-acetaminophen (PERCOCET/ROXICET) 5-325 MG tablet Take 1 tablet by mouth every 6 (six) hours as needed for moderate pain.  . pantoprazole (PROTONIX) 40 MG tablet Take 1 tablet (40 mg total) by mouth daily.  . potassium chloride SA (K-DUR,KLOR-CON) 20 MEQ tablet TAKE 1&1/2 TABLET BY MOUTH DAILY.  Marland Kitchen PROAIR HFA 108 (90 Base) MCG/ACT inhaler INHALE 2 PUFFS EVERY 6 HOURS AS NEEDED FOR SHORTNESS OF BREATH/WHEEZING.  . rosuvastatin (CRESTOR) 40 MG tablet Take 1 tablet (40 mg total) by mouth daily.  Marland Kitchen thiamine 100 MG tablet Take 1 tablet (100 mg total) by mouth daily.  . TRADJENTA 5 MG TABS tablet TAKE 1 TABLET BY MOUTH ONCE A DAY.  . traZODone (DESYREL) 50 MG tablet TAKE (1) TABLET BY MOUTH AT BEDTIME.  Marland Kitchen Vitamin D, Ergocalciferol, (DRISDOL) 50000 units CAPS capsule Take 1 capsule (50,000 Units total) by mouth once a week.  . [DISCONTINUED] hydrocortisone valerate cream (WESTCORT) 0.2 % Apply topically 2 (two) times daily.  . [DISCONTINUED] repaglinide (PRANDIN) 1 MG tablet TAKE 1 TABLET BY MOUTH 3 TIMES DAILY BEFORE MEALS. (Patient taking differently: Take 0.5 mg by mouth 3 (three) times daily before meals. )   No facility-administered encounter medications on file as of 08/09/2017.      Review of Systems  Constitutional: Positive for activity change and appetite change. Negative for chills.  HENT: Negative.   Eyes: Negative.   Respiratory: Positive for cough and shortness of breath.   Cardiovascular: Negative.   Gastrointestinal: Negative.   Genitourinary: Negative.   Musculoskeletal: Negative.   Neurological: Negative.   Psychiatric/Behavioral:  Positive for dysphoric mood and sleep disturbance. The patient is nervous/anxious.   All other systems reviewed and are negative.   Vitals:   08/09/17 1018  BP: (!) 142/74  Pulse: 69  Resp: 20  Temp: (!) 97.1 F (36.2 C)  TempSrc: Oral   There is no height or weight on file to calculate BMI. Physical Exam  Constitutional: She is oriented to person, place, and time. She appears well-developed and well-nourished.  HENT:  Head: Normocephalic.  Mouth/Throat: Oropharynx is clear and moist.  Eyes: Pupils are equal, round, and reactive to light.  Neck: Neck supple.  Cardiovascular: Normal rate and regular rhythm.   Murmur heard. Pulmonary/Chest: Effort normal and breath sounds normal. No respiratory distress. She has no wheezes. She has no rales.  Abdominal: Soft. Bowel sounds are normal. She exhibits no distension. There is no rebound.  Musculoskeletal:  Trace edema Bilateral  Neurological: She is alert and oriented to person, place, and time.  No focal deficits.   Skin: Skin is warm and dry.  Has multiple Venous stasis ulcers in both LE. In various stages of Healing. No Odor detected.  Psychiatric: She has a normal mood and affect. Her behavior is normal. Thought content normal.    Labs reviewed: Basic Metabolic Panel:  Recent Labs  06/28/17 0218 06/29/17 0540 06/30/17 0540  08/02/17 2108 08/03/17 0450 08/04/17 0958 08/05/17 0437  NA 140 142 141  --  144 141 143 139  K 4.3 4.2 4.1  --  3.9 3.5 4.5 4.6  CL 110 111 111  --  108 106 108 107  CO2 23 22 21*  --  24 23 23 23   GLUCOSE 151* 138* 121*  --  95 118* 155* 160*  BUN 26* 24* 22*  --  21* 20 22* 22*  CREATININE 1.83* 1.89* 1.97*  < > 1.26* 1.21* 1.31* 1.25*  CALCIUM 8.1* 8.5* 8.5*  --  9.5 8.8* 9.0 8.8*  MG 1.8 1.7 1.7  --  1.7  --   --   --   PHOS 2.5 2.5 2.7  --   --   --   --   --   < > = values in this interval not displayed. Liver Function Tests:  Recent Labs  06/29/17 0540 06/30/17 0540 08/03/17 0450    AST 30 33 18  ALT 24 27 17   ALKPHOS 125 108 48  BILITOT 0.5 0.9 0.8  PROT 5.2* 5.3* 6.3*  ALBUMIN 2.0* 2.0* 2.3*   No results for input(s): LIPASE, AMYLASE in the last 8760 hours.  Recent Labs  06/24/17 2156  AMMONIA 63*   CBC:  Recent Labs  08/02/17 2108 08/03/17 0450 08/04/17 0958  WBC 13.0* 10.5 9.4  NEUTROABS 10.6* 8.6* 7.6  HGB 10.4* 9.3* 10.2*  HCT 33.9* 29.8* 32.8*  MCV 99.4 99.0 98.5  PLT 465* 401* 400   Cardiac Enzymes:  Recent Labs  06/21/17 1637 06/21/17 2129 06/22/17 0337 06/23/17 0631  CKTOTAL 63  --   --  40  CKMB  --   --   --  5.2*  TROPONINI 0.32* 0.46* 0.41*  --    BNP: Invalid input(s): POCBNP Lab Results  Component Value Date   HGBA1C >15.5 (H) 06/25/2017   Lab Results  Component Value Date   TSH 1.49 02/28/2016   Lab Results  Component Value Date   VITAMINB12 433 08/03/2017   Lab Results  Component Value Date   FOLATE 21.6 08/03/2017   Lab Results  Component Value Date   IRON 36 08/03/2017   TIBC 297 08/03/2017   FERRITIN 634 (H) 08/03/2017    Imaging and Procedures obtained prior to SNF admission: No results found.  Assessment/Plan Cellulitis of both lower  extremities with Multiple Ulcers On Clindamycin. D/w The Wound care Nurse. Will continue Santyl for Debridement and Xeroform Dressing. Does not need surgical debridement right now. Cellulitis seems to have resolved. And swelling is better also.   DM (diabetes mellitus), type 2, uncontrolled, Patients A1C was more then 15 in the hospital in 08/18 She on Lantus and Tradejenta. BS mostly Less then 200.    Chronic systolic (congestive) heart failure  Continue on Lasix and Potassium. She is also on Coreg  Tachyarrhythmia On Loading dose of Amiodarone and Coreg. Follow up with Cardiology.  Essential Hypertensin BP mildly elevated with her Cardiomyopathy needs better control Continue to monitor. She is on Coreg and clonidine And lisinopril   S/P CVA on   On Aspirin and Plavix.DPAT for 3 months from 08/18 and then Plavix alone.  CKD (chronic kidney disease) stage 3, GFR 30-59 ml/min  Renal function stable  COPD  On Bronchodilators. Will restart her on Spiriva  Hyperlipidemia LDL goal <70 On Statin and Zetia and Fenofibrate.  Anxiety and depression Continuous to be in Prozac  Polysubstance abuse (including cocaine) And Non Compliance This is going to be problem at discharge. Not sure she would be able to go home. Will Need ? Assisted Living.  Pain Control Will continue Oxycodone. Will in crease Neurontin.   Family/ staff Communication:   Labs/tests ordered:  Total time spent in this patient care encounter was 45_ minutes; greater than 50% of the visit spent counseling patient, reviewing records , Labs and coordinating care for problems addressed at this encounter.

## 2017-08-10 ENCOUNTER — Ambulatory Visit: Payer: Self-pay | Admitting: Family Medicine

## 2017-08-11 ENCOUNTER — Other Ambulatory Visit: Payer: Self-pay

## 2017-08-11 ENCOUNTER — Other Ambulatory Visit: Payer: Self-pay | Admitting: *Deleted

## 2017-08-11 MED ORDER — OXYCODONE-ACETAMINOPHEN 5-325 MG PO TABS: 1.0000 | ORAL_TABLET | Freq: Four times a day (QID) | ORAL | 0 refills | Status: DC | PRN

## 2017-08-11 NOTE — Telephone Encounter (Signed)
RX Fax for [facility]@ [phone]  

## 2017-08-11 NOTE — Patient Outreach (Signed)
[facility]) Care Management  08/11/2017  [name] May 23, 1966 [id number]   CSW met with patient in her room at [facility] Room # 121. Patient reports that she has been doing well since being discharged from [facility] on Saturday, 10/6 and readmitting to [facility] on 10/8 after she was evaluated in her home by home health nurse, who felt that the wounds were infected, and suggested that she be brought to the hospital for evaluation. Patient is very grateful that [facility] had availability as her sister, [name] works as a Marine scientist here. Patient states that she plans to go to her son, [name]'s home when she is discharged but would still like to get her name on waiting lists for apartments because eventually she would like to live alone again. Patient reports that she had tried to call around to places but they require that she complete application in person or have someone go on her behalf. CSW called [facility] on [address] in [place] to inquire about application and whether it could be faxed to patient at [facility]. CSW left voicemail for [facility] & will await call back.   CSW will schedule routine SNF visit for 2 weeks.    Raynaldo Opitz, LCSW [facility]  Clinical Social Worker cell #: [phone]

## 2017-08-16 ENCOUNTER — Non-Acute Institutional Stay (SKILLED_NURSING_FACILITY): Payer: Medicare Other | Admitting: Internal Medicine

## 2017-08-16 ENCOUNTER — Encounter: Payer: Self-pay | Admitting: Internal Medicine

## 2017-08-16 ENCOUNTER — Encounter (HOSPITAL_COMMUNITY)
Admission: RE | Admit: 2017-08-16 | Discharge: 2017-08-16 | Disposition: A | Discharge status: Home / Self Care | Payer: Medicare Other | Source: Skilled Nursing Facility | Attending: Internal Medicine | Admitting: Internal Medicine

## 2017-08-16 DIAGNOSIS — F1010 Alcohol abuse, uncomplicated: Secondary | ICD-10-CM | POA: Insufficient documentation

## 2017-08-16 DIAGNOSIS — I5021 Acute systolic (congestive) heart failure: Secondary | ICD-10-CM

## 2017-08-16 DIAGNOSIS — E785 Hyperlipidemia, unspecified: Secondary | ICD-10-CM | POA: Insufficient documentation

## 2017-08-16 DIAGNOSIS — N183 Chronic kidney disease, stage 3 unspecified: Secondary | ICD-10-CM

## 2017-08-16 DIAGNOSIS — R197 Diarrhea, unspecified: Secondary | ICD-10-CM

## 2017-08-16 DIAGNOSIS — E162 Hypoglycemia, unspecified: Secondary | ICD-10-CM

## 2017-08-16 DIAGNOSIS — D631 Anemia in chronic kidney disease: Secondary | ICD-10-CM | POA: Insufficient documentation

## 2017-08-16 DIAGNOSIS — E1129 Type 2 diabetes mellitus with other diabetic kidney complication: Secondary | ICD-10-CM | POA: Insufficient documentation

## 2017-08-16 DIAGNOSIS — D473 Essential (hemorrhagic) thrombocythemia: Secondary | ICD-10-CM | POA: Insufficient documentation

## 2017-08-16 LAB — CBC
HCT: 34.6 % — ABNORMAL LOW (ref 36.0–46.0)
Hemoglobin: 10.5 g/dL — ABNORMAL LOW (ref 12.0–15.0)
MCH: 30 pg (ref 26.0–34.0)
MCHC: 30.3 g/dL (ref 30.0–36.0)
MCV: 98.9 fL (ref 78.0–100.0)
Platelets: 525 10*3/uL — ABNORMAL HIGH (ref 150–400)
RBC: 3.5 MIL/uL — ABNORMAL LOW (ref 3.87–5.11)
RDW: 15.7 % — ABNORMAL HIGH (ref 11.5–15.5)
WBC: 10.9 10*3/uL — ABNORMAL HIGH (ref 4.0–10.5)

## 2017-08-16 LAB — BASIC METABOLIC PANEL
Anion gap: 9 (ref 5–15)
BUN: 39 mg/dL — ABNORMAL HIGH (ref 6–20)
CO2: 27 mmol/L (ref 22–32)
Calcium: 9.1 mg/dL (ref 8.9–10.3)
Chloride: 101 mmol/L (ref 101–111)
Creatinine, Ser: 2.11 mg/dL — ABNORMAL HIGH (ref 0.44–1.00)
GFR calc Af Amer: 30 mL/min — ABNORMAL LOW (ref >60)
GFR calc non Af Amer: 26 mL/min — ABNORMAL LOW (ref >60)
Glucose, Bld: 48 mg/dL — ABNORMAL LOW (ref 65–99)
Potassium: 4.7 mmol/L (ref 3.5–5.1)
Sodium: 137 mmol/L (ref 135–145)

## 2017-08-16 NOTE — Progress Notes (Signed)
Location:   [facility] Room Number: 121/D Place of Service:  SNF (31) Provider:  Thurmond Butts, MD  Patient Care Team: [name], MD as PCP - [name], DO (Optometry) Standley Brooking, LCSW as [facility] [employer] (Licensed Clinical Social Worker)  Extended Emergency Contact Information Primary Emergency Contact: Herbin,[name] Address: [address]          [place], [place] [address] [place] Phone: [phone] Mobile Phone: [phone] Relation: Son Secondary Emergency Contact: Herbin,[name] Address: [address]          [place], [place] [address] [place] Phone: [phone] Relation: Daughter  Code Status:  Full Code Goals of care: Advanced Directive information Advanced Directives 08/16/2017  Does Patient Have a Medical Advance Directive? Yes  Type of Advance Directive (No Data)  Does patient want to make changes to medical advance directive? No - Patient declined  Would patient like information on creating a medical advance directive? No - Patient declined  Pre-existing out of facility DNR order (yellow form or pink MOST form) -  Some encounter information is confidential and restricted. Go to Review Flowsheets activity to see all data.     Chief Complaint  Patient presents with  . Acute Visit    Hypoglycemia, Diarrhea and Worsening renal Function    HPI:  Pt is a 51 y.o. female seen today for an acute visit for Hypoglycemia, Worsening Renal Function. She has h/o Alcohol Abuse, Polysubstance abuse, Hypertensive Cardiomyopathy with EF of 30-35% in 08/18, Recent Thalamic CVA in 08/18, Diabetes Type 2 uncontrolled with Neuropathy, COPD , Non Compliance and Depression, Chronic Kidney Disease with Baseline creat of 1.8, Hyperlipidemia  Patient admitted for treatment of Cellulitis and non Healing Ulcers in LE.  She had Blood work done this  morning and it showed BS of 48. Patient was asymptomatic. Also Her renal function showed Creat 2.11 which is worse then her baseline Creat of 1.8. Patient also is c/o Diarrhea. She thinks her Diarrhea started after she was started on Prostat supplement. She also continues to have pain in her leg which looks like Neuropathic Pain. Though per Wound care Nurse her wounds are looking better.  Past Medical History:  Diagnosis Date  . Alcohol use   . Ankle fracture, lateral malleolus, closed 12/30/2011  . Anxiety   . Breast mass, left 12/15/2011  . CKD (chronic kidney disease)   . COPD (chronic obstructive pulmonary disease) ([facility])   . Diabetes mellitus, type 2 ([facility])   . Diabetic Charcot foot ([facility]) 12/30/2011  . Essential hypertension   . History of GI bleed 12/05/2009   Qualifier: Diagnosis of  By: Zeb Comfort    . Hyperlipidemia   . Hypertensive cardiomyopathy ([facility]) 11/08/2012   a. 05/2016: echo showing EF of 20-25% with normal cors by cath in 07/2016.  Marland Kitchen Noncompliance with medication regimen   . Obesity   . Panic attacks   . Tobacco abuse    Past Surgical History:  Procedure Laterality Date  . BREAST BIOPSY    . CARDIAC CATHETERIZATION N/A 07/28/2016   Procedure: Left Heart Cath and Coronary Angiography;  Surgeon: Jettie Booze, MD;  Location: [facility];  Service: Cardiovascular;  Laterality: N/A;  . DILATION AND CURETTAGE OF UTERUS      No Known Allergies  Outpatient Encounter Prescriptions as of 08/16/2017  Medication Sig  . Amino Acids-Protein Hydrolys (FEEDING SUPPLEMENT, PRO-STAT  SUGAR FREE 64,) LIQD Take 30 mLs by mouth 2 (two) times daily.  Marland Kitchen amiodarone (PACERONE) 200 MG tablet Take 1 tablet (200 mg total) by mouth 2 (two) times daily.  Marland Kitchen aspirin EC 81 MG EC tablet Take 1 tablet (81 mg total) by mouth daily.  . Azelastine HCl (ASTEPRO) 0.15 % SOLN Place 2 sprays into the nose every 12 (twelve) hours as needed (for allergies).  . carvedilol (COREG) 25 MG tablet  Take 1 tablet (25 mg total) by mouth 2 (two) times daily with a meal.  . cloNIDine (CATAPRES) 0.2 MG tablet TAKE (1) TABLET BY MOUTH AT BEDTIME FOR BLOOD PRESSURE.  . clopidogrel (PLAVIX) 75 MG tablet TAKE 1 TABLET BY MOUTH DAILY WITH BREAKFAST.  . collagenase (SANTYL) ointment Apply 1 application topically daily. To affected area  . ezetimibe (ZETIA) 10 MG tablet Take 1 tablet (10 mg total) by mouth daily.  . fenofibrate micronized (LOFIBRA) 200 MG capsule Take 200 mg by mouth daily before breakfast.  . FLUoxetine (PROZAC) 20 MG capsule TAKE 1 CAPSULE BY MOUTH EVERY DAY.  . fluticasone (FLONASE) 50 MCG/ACT nasal spray Place 2 sprays into both nostrils daily.  . folic acid (FOLVITE) 1 MG tablet Take 1 tablet (1 mg total) by mouth daily.  . furosemide (LASIX) 20 MG tablet TAKE 1 TABLET BY MOUTH DAILY.  Marland Kitchen gabapentin (NEURONTIN) 100 MG capsule Take 200 mg by mouth at bedtime.  . gabapentin (NEURONTIN) 100 MG capsule Take 100 mg by mouth 2 (two) times daily.  . insulin aspart (NOVOLOG) 100 UNIT/ML injection Inject 0-9 Units into the skin 3 (three) times daily with meals.  . Insulin Glargine (LANTUS SOLOSTAR) 100 UNIT/ML Solostar Pen Inject 10 Units into the skin daily at 10 pm.  . ipratropium-albuterol (DUONEB) 0.5-2.5 (3) MG/3ML SOLN Take 3 mLs by nebulization every 6 (six) hours as needed.  Marland Kitchen lisinopril (PRINIVIL,ZESTRIL) 5 MG tablet Take 1 tablet (5 mg total) by mouth daily.  . montelukast (SINGULAIR) 10 MG tablet TAKE (1) TABLET BY MOUTH AT BEDTIME.  . Multiple Vitamin (MULTIVITAMIN WITH MINERALS) TABS tablet Take 1 tablet by mouth daily.  Marland Kitchen oxyCODONE-acetaminophen (PERCOCET/ROXICET) 5-325 MG tablet Take 1 tablet by mouth every 6 (six) hours as needed for moderate pain.  . pantoprazole (PROTONIX) 40 MG tablet Take 1 tablet (40 mg total) by mouth daily.  . potassium chloride SA (K-DUR,KLOR-CON) 20 MEQ tablet TAKE 1&1/2 TABLET BY MOUTH DAILY.  Marland Kitchen PROAIR HFA 108 (90 Base) MCG/ACT inhaler INHALE 2  PUFFS EVERY 6 HOURS AS NEEDED FOR SHORTNESS OF BREATH/WHEEZING.  . rosuvastatin (CRESTOR) 40 MG tablet Take 1 tablet (40 mg total) by mouth daily.  Marland Kitchen thiamine 100 MG tablet Take 1 tablet (100 mg total) by mouth daily.  Marland Kitchen tiotropium (SPIRIVA HANDIHALER) 18 MCG inhalation capsule Place 18 mcg into inhaler and inhale daily. 2 puffs  . TRADJENTA 5 MG TABS tablet TAKE 1 TABLET BY MOUTH ONCE A DAY.  . traZODone (DESYREL) 50 MG tablet TAKE (1) TABLET BY MOUTH AT BEDTIME.  Marland Kitchen Vitamin D, Ergocalciferol, (DRISDOL) 50000 units CAPS capsule Take 1 capsule (50,000 Units total) by mouth once a week.  Marland Kitchen amiodarone (PACERONE) 400 MG tablet Take 1 tablet (400 mg total) by mouth 2 (two) times daily.  . [DISCONTINUED] gabapentin (NEURONTIN) 100 MG capsule TAKE 1 CAPSULE BY MOUTH THREE TIMES A DAY.   No facility-administered encounter medications on file as of 08/16/2017.      Review of Systems  Constitutional: Positive for activity change, appetite  change and unexpected weight change. Negative for chills, fatigue and fever.  HENT: Negative.   Respiratory: Positive for apnea, cough and shortness of breath.   Cardiovascular: Negative.   Gastrointestinal: Positive for diarrhea.  Genitourinary: Negative.   Musculoskeletal: Positive for gait problem and myalgias. Negative for arthralgias.  Skin: Positive for wound.  Neurological: Positive for weakness.  Psychiatric/Behavioral: Negative.     Immunization History  Administered Date(s) Administered  . H1N1 11/07/2008  . Influenza Split 07/12/2012  . Influenza Whole 07/03/2007, 07/13/2007, 07/24/2008, 09/26/2009, 07/17/2010  . Influenza,inj,Quad PF,6+ Mos 08/28/2013, 09/10/2014, 09/03/2015, 08/31/2016  . Pneumococcal Polysaccharide-23 08/25/2006, 11/12/2012  . Td 09/08/2005  . Tdap 03/15/2012   Pertinent  Health Maintenance Due  Topic Date Due  . INFLUENZA VACCINE  09/25/2017 (Originally 05/26/2017)  . FOOT EXAM  09/25/2017 (Originally 06/18/2017)  .  MAMMOGRAM  09/25/2017 (Originally 07/31/2016)  . URINE MICROALBUMIN  09/25/2017 (Originally 09/02/2016)  . COLONOSCOPY  09/25/2017 (Originally 08/23/2016)  . HEMOGLOBIN A1C  12/23/2017  . OPHTHALMOLOGY EXAM  03/16/2018  . PAP SMEAR  05/08/2019   Fall Risk  07/14/2017 12/15/2016 06/24/2016 06/18/2016  Falls in the past year? Yes No No No  Number falls in past yr: 1 - - -  Injury with Fall? No - - -  Risk for fall due to : Impaired mobility - Medication side effect -  Follow up Falls prevention discussed - - -   Functional Status Survey:    Vitals:   08/16/17 2159  BP: 102/74  Pulse: 63  Resp: 18  Temp: (!) 97.1 F (36.2 C)   There is no height or weight on file to calculate BMI. Physical Exam  Constitutional: She is oriented to person, place, and time. She appears well-developed and well-nourished.  HENT:  Head: Normocephalic.  Mouth/Throat: Oropharynx is clear and moist.  Eyes: Pupils are equal, round, and reactive to light.  Neck: Neck supple.  Cardiovascular: Normal rate.   Murmur heard. Pulmonary/Chest: Effort normal. No respiratory distress. She has no wheezes.  Abdominal: Soft. Bowel sounds are normal. She exhibits no distension. There is no tenderness. There is no rebound.  Musculoskeletal: She exhibits no edema.  Neurological: She is alert and oriented to person, place, and time.  Skin: Skin is warm and dry.  Psychiatric: She has a normal mood and affect. Her behavior is normal. Thought content normal.    Labs reviewed:  Recent Labs  06/28/17 0218 06/29/17 0540 06/30/17 0540  08/02/17 2108  08/05/17 0437 08/09/17 1130 08/16/17 0700  NA 140 142 141  --  144  < > 139 139 137  K 4.3 4.2 4.1  --  3.9  < > 4.6 4.1 4.7  CL 110 111 111  --  108  < > 107 102 101  CO2 23 22 21*  --  24  < > 23 28 27   GLUCOSE 151* 138* 121*  --  95  < > 160* 152* 48*  BUN 26* 24* 22*  --  21*  < > 22* 25* 39*  CREATININE 1.83* 1.89* 1.97*  < > 1.26*  < > 1.25* 1.50* 2.11*  CALCIUM  8.1* 8.5* 8.5*  --  9.5  < > 8.8* 8.9 9.1  MG 1.8 1.7 1.7  --  1.7  --   --   --   --   PHOS 2.5 2.5 2.7  --   --   --   --   --   --   < > = values  in this interval not displayed.  Recent Labs  06/29/17 0540 06/30/17 0540 08/03/17 0450  AST 30 33 18  ALT 24 27 17   ALKPHOS 125 108 48  BILITOT 0.5 0.9 0.8  PROT 5.2* 5.3* 6.3*  ALBUMIN 2.0* 2.0* 2.3*    Recent Labs  08/03/17 0450 08/04/17 0958 08/09/17 1130 08/16/17 0700  WBC 10.5 9.4 10.6* 10.9*  NEUTROABS 8.6* 7.6 8.2*  --   HGB 9.3* 10.2* 10.0* 10.5*  HCT 29.8* 32.8* 32.3* 34.6*  MCV 99.0 98.5 98.5 98.9  PLT 401* 400 427* 525*   Lab Results  Component Value Date   TSH 1.49 02/28/2016   Lab Results  Component Value Date   HGBA1C >15.5 (H) 06/25/2017   Lab Results  Component Value Date   CHOL 164 06/24/2017   HDL 51 06/24/2017   LDLCALC 96 06/24/2017   LDLDIRECT 122 (H) 01/08/2010   TRIG 87 06/24/2017   CHOLHDL 3.2 06/24/2017    Significant Diagnostic Results in last 30 days:  US Arterial Abi (screening Lower Extremity)  Result Date: 08/04/2017 CLINICAL DATA:  51 year old female with a history of leg wounds. Cardiovascular risk factors include smoking, hypertension, hyperlipidemia, diabetes EXAM: NONINVASIVE PHYSIOLOGIC VASCULAR STUDY OF BILATERAL LOWER EXTREMITIES TECHNIQUE: Evaluation of both lower extremities was performed at rest, including calculation of ankle-brachial indices, multiple segmental pressure evaluation, segmental Doppler and segmental pulse volume recording. COMPARISON:  None. FINDINGS: Right ABI:  Not obtained Left ABI:  1.18 Right Lower Extremity: Segmental Doppler of the right lower extremity demonstrates probable monophasic signal Left Lower Extremity: Segmental Doppler of the left lower extremity demonstrates probable monophasic signal IMPRESSION: Right: Resting ankle-brachial index on the right was not obtained. Segmental Doppler suggests monophasic signal at the ankle, indicating more  proximal arterial occlusive disease. If there is concern for anatomic imaging, CT angiogram and runoff may be considered. Left: Resting ankle-brachial index within normal limits, though potentially falsely elevated secondary to noncompressible vessels, as the segmental Doppler suggests proximal vascular disease. Again, if there is concern for anatomic imaging, CT angiogram and runoff may be useful. Signed, Dulcy Fanny. Earleen Newport, DO [facility] Electronically Signed   By: Corrie Mckusick D.O.   On: 08/04/2017 15:36   Dg Foot Complete Right  Result Date: 08/02/2017 CLINICAL DATA:  Multiple wounds on her legs, RIGHT foot has begun to have foul smell when home health nurse came to change dressings today EXAM: RIGHT FOOT COMPLETE - 3+ VIEW COMPARISON:  11/11/2009 FINDINGS: Mild diffuse osseous demineralization. Scattered soft tissue swelling. Pes planus. Small plantar calcaneal spur. Ossicle at dorsal margin of the tarsal navicular, appears corticated and old. Nondisplaced fracture identified at base of proximal phalanx third toe. No additional fracture, dislocation, or bone destruction. IMPRESSION: Extensive soft tissue swelling and pes planus with midfoot degenerative changes. Nondisplaced fracture at base of proximal phalanx RIGHT third toe. Electronically Signed   By: Lavonia Dana M.D.   On: 08/02/2017 23:40    Assessment/Plan  Hypoglycemia Will decrease her Lantus to 5 units. Continue on Tradjenta Discontinue Sliding scale. Continue Accu chck  Cellulitis of both lower extremities with Multiple Ulcers Was treated with  Clindamycin. D/w The Wound care Nurse. Will continue Santyl for Debridement and Xeroform Dressing. Does not need surgical debridement right now. Cellulitis seems to have resolved. And swelling is better also.  CKD (chronic kidney disease) stage 3, GFR 30-59 ml/min  Worsening Renal function  Will discontinue Lisinopril for now Also  Hold Lasix and Potassium.  Repeat BMP.  Diarrhea AS she was on Clindamycin will Check Stool for C.Diff. Discontinue prostat  Chronic systolic (congestive) heart failure  Holding on Lasix and Potassium and Lisinopril. She is  on Coreg Will continue to follow her weight  Tachyarrhythmia On Loading dose of Amiodarone and Coreg. Follow up with Cardiology.  Essential Hypertensin BP is running low. Holding lasix and Lisinopril will help Will eventually discontinue Clonidine and try to restart Lisinopril when her Creat stabilize.  S/P CVA on  On Aspirin and Plavix.DPAT for 3 months from 08/18 and then Plavix alone.  COPD  On Bronchodilators.  on Spiriva  Hyperlipidemia LDL goal <70 On Statin and Zetia and Fenofibrate.  Anxiety and depression Continuous to be in Prozac  Polysubstance abuse (including cocaine) And Non Compliance This is going to be problem at discharge. Not sure she would be able to go home. Will Need ? Assisted Living.  Pain Control Will continue Oxycodone.and Neurontin.  Family/ staff Communication:   Labs/tests ordered:   Total time spent in this patient care encounter was 45_ minutes; greater than 50% of the visit spent counseling patient, reviewing records , Labs and coordinating care for problems addressed at this encounter.

## 2017-08-18 ENCOUNTER — Encounter (HOSPITAL_COMMUNITY)
Admission: AD | Admit: 2017-08-18 | Discharge: 2017-08-18 | Disposition: A | Discharge status: Home / Self Care | Payer: Medicare Other | Source: Skilled Nursing Facility

## 2017-08-18 ENCOUNTER — Encounter: Payer: Medicare Other | Admitting: Vascular Surgery

## 2017-08-18 LAB — URINALYSIS, ROUTINE W REFLEX MICROSCOPIC
Bilirubin Urine: NEGATIVE
Glucose, UA: NEGATIVE mg/dL
Ketones, ur: NEGATIVE mg/dL
Nitrite: NEGATIVE
Protein, ur: 100 mg/dL — AB
Specific Gravity, Urine: 1.012 (ref 1.005–1.030)
pH: 6 (ref 5.0–8.0)

## 2017-08-19 ENCOUNTER — Encounter (HOSPITAL_COMMUNITY)
Admission: RE | Admit: 2017-08-19 | Discharge: 2017-08-19 | Disposition: A | Discharge status: Home / Self Care | Payer: Medicare Other | Source: Skilled Nursing Facility | Attending: Internal Medicine | Admitting: Internal Medicine

## 2017-08-19 ENCOUNTER — Encounter: Payer: Self-pay | Admitting: Physician Assistant

## 2017-08-19 DIAGNOSIS — M6281 Muscle weakness (generalized): Secondary | ICD-10-CM | POA: Insufficient documentation

## 2017-08-19 DIAGNOSIS — L03115 Cellulitis of right lower limb: Secondary | ICD-10-CM | POA: Insufficient documentation

## 2017-08-19 DIAGNOSIS — R262 Difficulty in walking, not elsewhere classified: Secondary | ICD-10-CM | POA: Insufficient documentation

## 2017-08-19 DIAGNOSIS — I10 Essential (primary) hypertension: Secondary | ICD-10-CM | POA: Insufficient documentation

## 2017-08-19 DIAGNOSIS — N183 Chronic kidney disease, stage 3 (moderate): Secondary | ICD-10-CM | POA: Insufficient documentation

## 2017-08-19 DIAGNOSIS — L03116 Cellulitis of left lower limb: Secondary | ICD-10-CM | POA: Insufficient documentation

## 2017-08-19 LAB — C DIFFICILE QUICK SCREEN W PCR REFLEX
C Diff antigen: NEGATIVE
C Diff interpretation: NOT DETECTED
C Diff toxin: NEGATIVE

## 2017-08-19 LAB — BASIC METABOLIC PANEL
Anion gap: 8 (ref 5–15)
BUN: 31 mg/dL — ABNORMAL HIGH (ref 6–20)
CO2: 24 mmol/L (ref 22–32)
Calcium: 8.7 mg/dL — ABNORMAL LOW (ref 8.9–10.3)
Chloride: 106 mmol/L (ref 101–111)
Creatinine, Ser: 1.87 mg/dL — ABNORMAL HIGH (ref 0.44–1.00)
GFR calc Af Amer: 34 mL/min — ABNORMAL LOW (ref >60)
GFR calc non Af Amer: 30 mL/min — ABNORMAL LOW (ref >60)
Glucose, Bld: 168 mg/dL — ABNORMAL HIGH (ref 65–99)
Potassium: 5.1 mmol/L (ref 3.5–5.1)
Sodium: 138 mmol/L (ref 135–145)

## 2017-08-19 LAB — URINE CULTURE

## 2017-08-19 NOTE — Progress Notes (Signed)
Cardiology Office Note    Date:  08/20/2017  ID:  [name], DOB 18-Jun-1966, MRN [id number] PCP:  [name], MD  Cardiologist:  Dr. Bronson Ing  Chief Complaint: f/u CHF  History of Present Illness:  [name] is a 51 y.o. female with history of NICM with chronic combined CHF (EF 20-25% by echo 05/2016), normal coronary arteries 07/2016, stroke, CKD stage 3, severely uncontrolled DM, ETOH abuse, cocaine use, depression/anxiety/panic attacks, GIB, COPD, chronic anemia, paroxysmal atrial tachycardia (started on amiodarone 07/2017), mitral regurgitation who presents for post-hospital f/u.  She was initially diagnosed with new cardiomyopathy in 05/2016 and underwent cath 07/28/16 showing no apparent CAD; tortuous right subclavian requiring Glide wire, would not use radial approach in future if cath was needed. She was seen by cardiology for pre-op clearance in 05/2017 for back surgery at which time consult note indicated a "friend" had given her some cocaine to help deal with chronic back pain and that she was also drinking 2 pineapple rum drinks daily. She didn't end up going for surgery. However, she was admitted 06/2017 with hypoxia, confusion, AMS in the presence of recurrently positive UDS for cocaine and THC. She was also found to have an acute CVA felt due to hypotension, cannot rule out cardioembolic infarcts. Carotid duplex without significant stenosis (1-39%). Neurology recommended DAPT for 3 months then Plavix alone thereafter. A1c was >15. Cardiology recommended to consider OP event monitor but do not see this was ever ordered. Given poor compliance and living situation, Dr. Rayann Heman felt she would not be a candidate for implantable loop recorder. Echo 06/22/17 showed moderate LVH, EF 10-93%, grade 2 diastolic dysfunction, moderate mitral regurgitation, mildly dilated left atrium.   More recently she was admitted 10/8-10/12 with cellulitis of the lower extremities requiring  antibiotics and wound care. Imaging also showed nondisplaced fracture at the base of the proximal phallus of right third toe. She was also found to have recurrent atrial tachycardia and acute on chronic combined CHF, although majority of edema seemed to be localized to wounds. Bisoprolol was changed to carvedilol and she was started on amiodarone (400 mg by mouth twice a day for 7 days stop on 08/13/2017, and start amiodarone 200 mg by mouth twice a day from 08/14/2017. Lisinopril was increased. Cr was 2.11 on 10/22, then 1.87 on 10/25 with K 5.1. Labs also showed Hgb 10.5, Plt 525, Mg 1.7, albumin 2.3, normal AST/ALT. Last TSH in 2017 wnl. Due to rising Cr on 10/22, nursing home physician held Lasix, potassium, lisinopril. Prior baseline Cr appears to be 1.4-1.8.  She returns for follow-up today. Overall her swelling has improved but she continues to have significant leg pain as well as generalized pain all over. She doesn't feel like the Percocet are helping. It sounds like she was previously used to using a lot more. She denies any CP, SOB. She continues to feel occasional sensation of her heart speeding up. This resolves spontaneously. No syncope. Refused to weigh today.   Past Medical History:  Diagnosis Date  . Alcohol use   . Ankle fracture, lateral malleolus, closed 12/30/2011  . Anxiety   . Breast mass, left 12/15/2011  . Chronic anemia   . Chronic combined systolic and diastolic CHF (congestive heart failure) ([place])   . CKD (chronic kidney disease), stage III ([place])   . Cocaine abuse ([place])   . COPD (chronic obstructive pulmonary disease) ([place])   . Diabetes mellitus, type 2 ([place])   . Diabetic Charcot  foot ([place]) 12/30/2011  . Essential hypertension   . History of GI bleed 12/05/2009   Qualifier: Diagnosis of  By: Zeb Comfort    . Hyperlipidemia   . Hypertensive cardiomyopathy ([place]) 11/08/2012   a. 05/2016: echo showing EF of 20-25% with normal cors by cath in 07/2016.  [place] Kitchen Noncompliance  with medication regimen   . Obesity   . Panic attacks   . PAT (paroxysmal atrial tachycardia) ([place])    a. started on amiodarone 07/2017 for this.  . Tobacco abuse     Past Surgical History:  Procedure Laterality Date  . BREAST BIOPSY    . CARDIAC CATHETERIZATION N/A 07/28/2016   Procedure: Left Heart Cath and Coronary Angiography;  Surgeon: Jettie Booze, MD;  Location: [place] [facility];  Service: Cardiovascular;  Laterality: N/A;  . DILATION AND CURETTAGE OF UTERUS      Current Medications: Current Meds  Medication Sig  . Amino Acids-Protein Hydrolys (FEEDING SUPPLEMENT, PRO-STAT SUGAR FREE 64,) LIQD Take 30 mLs by mouth 2 (two) times daily.  [place] Kitchen amiodarone (PACERONE) 200 MG tablet Take 1 tablet (200 mg total) by mouth 2 (two) times daily.  Mar[place] Kitchen aspirin EC 81 MG EC tablet Take 1 tablet (81 mg total) by mouth daily.  . Azelastine HCl (ASTEPRO) 0.15 % SOLN Place 2 sprays into the nose every 12 (twelve) hours as needed (for allergies).  . carvedilol (COREG) 25 MG tablet Take 1 tablet (25 mg total) by mouth 2 (two) times daily with a meal.  . cloNIDine (CATAPRES) 0.2 MG tablet TAKE (1) TABLET BY MOUTH AT BEDTIME FOR BLOOD PRESSURE.  . clopidogrel (PLAVIX) 75 MG tablet TAKE 1 TABLET BY MOUTH DAILY WITH BREAKFAST.  . collagenase (SANTYL) ointment Apply 1 application topically daily. To affected area  . ezetimibe (ZETIA) 10 MG tablet Take 1 tablet (10 mg total) by mouth daily.  . fenofibrate micronized (LOFIBRA) 200 MG capsule Take 200 mg by mouth daily before breakfast.  . FLUoxetine (PROZAC) 20 MG capsule TAKE 1 CAPSULE BY MOUTH EVERY DAY.  . fluticasone (FLONASE) 50 MCG/ACT nasal spray Place 2 sprays into both nostrils daily.  . folic acid (FOLVITE) 1 MG tablet Take 1 tablet (1 mg total) by mouth daily.  . furosemide (LASIX) 20 MG tablet TAKE 1 TABLET BY MOUTH DAILY.  Mar[place] Kitchen gabapentin (NEURONTIN) 100 MG capsule Take 200 mg by mouth 3 (three) times daily.   . insulin aspart (NOVOLOG) 100  UNIT/ML injection Inject 0-9 Units into the skin 3 (three) times daily with meals.  . Insulin Glargine (LANTUS SOLOSTAR) 100 UNIT/ML Solostar Pen Inject 10 Units into the skin daily at 10 pm.  . lisinopril (PRINIVIL,ZESTRIL) 5 MG tablet Take 1 tablet (5 mg total) by mouth daily.  . montelukast (SINGULAIR) 10 MG tablet TAKE (1) TABLET BY MOUTH AT BEDTIME.  . Multiple Vitamin (MULTIVITAMIN WITH MINERALS) TABS tablet Take 1 tablet by mouth daily.  Mar[place] Kitchen oxyCODONE-acetaminophen (PERCOCET/ROXICET) 5-325 MG tablet Take 1 tablet by mouth every 6 (six) hours as needed for moderate pain.  . pantoprazole (PROTONIX) 40 MG tablet Take 1 tablet (40 mg total) by mouth daily.  . potassium chloride SA (K-DUR,KLOR-CON) 20 MEQ tablet TAKE 1&1/2 TABLET BY MOUTH DAILY.  Marland Kitchen PROAIR HFA 108 (90 Base) MCG/ACT inhaler INHALE 2 PUFFS EVERY 6 HOURS AS NEEDED FOR SHORTNESS OF BREATH/WHEEZING.  . rosuvastatin (CRESTOR) 40 MG tablet Take 1 tablet (40 mg total) by mouth daily.  Mar[place] Kitchen thiamine 100 MG tablet Take 1 tablet (100 mg total)  by mouth daily.  Mar[place] Kitchen tiotropium (SPIRIVA HANDIHALER) 18 MCG inhalation capsule Place 18 mcg into inhaler and inhale daily. 2 puffs  . TRADJENTA 5 MG TABS tablet TAKE 1 TABLET BY MOUTH ONCE A DAY.  . traZODone (DESYREL) 50 MG tablet TAKE (1) TABLET BY MOUTH AT BEDTIME.  Mar[place] Kitchen Vitamin D, Ergocalciferol, (DRISDOL) 50000 units CAPS capsule Take 1 capsule (50,000 Units total) by mouth once a week.  . [DISCONTINUED] ipratropium-albuterol (DUONEB) 0.5-2.5 (3) MG/3ML SOLN Take 3 mLs by nebulization every 6 (six) hours as needed.     Allergies:   Patient has no known allergies.   Social History   Social History  . Marital status: Widowed    Spouse name: N/A  . Number of children: 2  . Years of education: N/A   Social History Main Topics  . Smoking status: Current Every Day Smoker    Packs/day: 1.00    Years: 20.00    Types: Cigarettes    Last attempt to quit: 06/03/2016  . Smokeless tobacco: Never Used  .  Alcohol use No     Comment: Quit in 2017  . Drug use: Yes    Frequency: 3.0 times per week    Types: Marijuana, Cocaine  . Sexual activity: Not Currently    Birth control/ protection: None   Other Topics Concern  . None   Social History Narrative   Currently living in a motel. Stopped taking all her medicines because she was "fed up"     Family History:  Family History  Problem Relation Age of Onset  . Hypertension Mother   . Heart attack Mother   . Hypertension Father        CABG   . Hypertension Sister   . Hypertension Brother   . Hypertension Sister   . Cancer Sister        breast   . Arthritis Unknown   . Cancer Unknown   . Diabetes Unknown   . Asthma Unknown   . Hypertension Daughter   . Hypertension Son     ROS:   Please see the history of present illness.  All other systems are reviewed and otherwise negative.    PHYSICAL EXAM:   VS:  BP 122/74 (BP Location: Right Arm)   Pulse 72   Ht 5\' 1"  (1.549 m)   LMP 05/19/2016   SpO2 92%   BMI: pt refused to weigh GEN: Chronically ill appearing but well-made up AAF in no acute distress  HEENT: normocephalic, atraumatic Neck: no JVD, carotid bruits, or masses Cardiac:RRR; no murmurs, rubs, or gallops, wrapped BLE without significant edema; soft Respiratory:  clear to auscultation bilaterally, normal work of breathing GI: soft, nontender, nondistended, + BS MS: no deformity or atrophy  Skin: warm and dry Neuro:  Alert and Oriented x 3, Strength and sensation are intact, follows commands Psych: euthymic mood, full affect, Tangential, talkative,  Wt Readings from Last 3 Encounters:  08/06/17 132 lb 0.9 oz (59.9 kg)  06/30/17 174 lb 4.8 oz (79.1 kg)  12/15/16 184 lb 1.9 oz (83.5 kg)      Studies/Labs Reviewed:   EKG:  EKG was ordered today and personally reviewed by me and demonstrates NSR 75bpm, nonspecific St-T changes, QTc 438ms, no acute changes  Recent Labs: 06/22/2017: B Natriuretic Peptide  835.0 08/02/2017: Magnesium 1.7 08/03/2017: ALT 17 08/16/2017: Hemoglobin 10.5; Platelets 525 08/19/2017: BUN 31; Creatinine, Ser 1.87; Potassium 5.1; Sodium 138   Lipid Panel    Component Value Date/Time  CHOL 164 06/24/2017 2147   TRIG 87 06/24/2017 2147   HDL 51 06/24/2017 2147   CHOLHDL 3.2 06/24/2017 2147   VLDL 17 06/24/2017 2147   LDLCALC 96 06/24/2017 2147   LDLDIRECT 122 (H) 01/08/2010 0818    Additional studies/ records that were reviewed today include: Summarized above   ASSESSMENT & PLAN:   1. Chronic combined CHF/NICM - although she refused to weigh, she presently appears euvolemic. Lisinopril and Lasix are on hold for recent rise in creatinine to 2.11 and softer blood pressure. Creatinine has come back down to 1.87 by labs yesterday. Her prior baseline seemed to be 1.4-1.8. Would recommend to continue to follow for swelling at nursing home; further monitoring of renal function per primary doctor. It may be feasible to use Lasix PRN weight gain or increased swelling in the future. Given her potassium of 5.1, she would not be a candidate for resumption of lisinopril at this time, or addition of spironolactone. However, there is room to optimize her CHF regimen. Recommend to decrease clonidine to 0.1mg  qhs starting tonight and add hydralazine 12.5mg  TID tomorrow. Will have her f/u in 1 week. If able, would like to try to get her off clonidine and onto combination of nitrates/hydralazine. Eventually would need input from EP regarding ICD if EF remains low but at present time patient is adamant she does not want any invasive procedures. Recommend  2g sodium restriction, 2L fluid restriction, daily weights at nursing home. Please call if weight goes up by 3 lbs overnight or 5lbs in several days' time. 2. Paroxysmal atrial tachycardia - will place 30 day event monitor to assess arrhythmia burden. Continue amiodarone for now pending results. Refer to EP as recommended by Dr. Johnsie Cancel in  the hospital. Will also obtain baseline TSH/free T4. 3. Mitral regurgitation - follow clinically for now. 4. Polysubstance abuse - not sure what long-term plan is for her in terms of living situation, and she wasn't able to tell me this either. Abstinence of all abusive substances will be necessary for her survival. 5. Cellulitis - per primary care. Pt also has vascular appointment arranged. She's not been able to tolerate ABIs previously due to LE pain.  Disposition: F/u with APP in 1 week for med optimization visit, and also refer to EP per Dr. Kyla Balzarine recommendation.   Medication Adjustments/Labs and Tests Ordered: Current medicines are reviewed at length with the patient today.  Concerns regarding medicines are outlined above. Medication changes, Labs and Tests ordered today are summarized above and listed in the Patient Instructions accessible in Encounters.   Signed, Charlie Pitter, PA-C  08/20/2017 1:52 PM    [facility] in [place]. [place], [place] [address] Ph: [phone]; Fax [phone]

## 2017-08-20 ENCOUNTER — Ambulatory Visit (INDEPENDENT_AMBULATORY_CARE_PROVIDER_SITE_OTHER): Payer: Medicare Other | Admitting: Physician Assistant

## 2017-08-20 ENCOUNTER — Other Ambulatory Visit: Payer: Self-pay

## 2017-08-20 ENCOUNTER — Encounter: Payer: Self-pay | Admitting: Physician Assistant

## 2017-08-20 VITALS — BP 122/74 | HR 72 | Ht 61.0 in

## 2017-08-20 DIAGNOSIS — I5042 Chronic combined systolic (congestive) and diastolic (congestive) heart failure: Secondary | ICD-10-CM

## 2017-08-20 DIAGNOSIS — F1910 Other psychoactive substance abuse, uncomplicated: Secondary | ICD-10-CM | POA: Diagnosis not present

## 2017-08-20 DIAGNOSIS — I472 Ventricular tachycardia: Secondary | ICD-10-CM

## 2017-08-20 DIAGNOSIS — I471 Supraventricular tachycardia: Secondary | ICD-10-CM

## 2017-08-20 DIAGNOSIS — M79604 Pain in right leg: Secondary | ICD-10-CM | POA: Diagnosis not present

## 2017-08-20 DIAGNOSIS — I340 Nonrheumatic mitral (valve) insufficiency: Secondary | ICD-10-CM

## 2017-08-20 DIAGNOSIS — I4729 Other ventricular tachycardia: Secondary | ICD-10-CM

## 2017-08-20 DIAGNOSIS — M79605 Pain in left leg: Secondary | ICD-10-CM

## 2017-08-20 MED ORDER — HYDRALAZINE HCL 25 MG PO TABS: 12.5000 mg | ORAL_TABLET | Freq: Three times a day (TID) | ORAL | 3 refills | Status: DC

## 2017-08-20 MED ORDER — CLONIDINE HCL 0.1 MG PO TABS: 0.1000 mg | ORAL_TABLET | Freq: Every day | ORAL | 11 refills | Status: DC

## 2017-08-20 MED ORDER — CLONIDINE HCL 0.1 MG PO TABS: 0.1000 mg | ORAL_TABLET | Freq: Every day | ORAL | 0 refills | Status: DC

## 2017-08-20 MED ORDER — CLONIDINE HCL 0.1 MG PO TABS: 0.1000 mg | ORAL_TABLET | Freq: Every day | ORAL | 1 refills | Status: DC

## 2017-08-20 NOTE — Addendum Note (Signed)
Addended by: Charlie Pitter on: 08/20/2017 02:37 PM   Modules accepted: Orders

## 2017-08-20 NOTE — Patient Instructions (Addendum)
Medication Instructions:  DECREASE CLONIDINE TO 0.1 MG- ONCE DAILY AT BEDTIME START HYDRALAZINE 12.5 MG- THREE TIMES DAILY   (START TOMORROW)  STAY OFF LISINOPRIL AND FUROSEMIDE FOR NOW   Labwork: TODAY  TSH  FREE T4  Testing/Procedures: Your physician has recommended that you wear an event monitor. Event monitors are medical devices that record the heart's electrical activity. Doctors most often [name] these monitors to diagnose arrhythmias. Arrhythmias are problems with the speed or rhythm of the heartbeat. The monitor is a small, portable device. You can wear one while you do your normal daily activities. This is usually used to diagnose what is causing palpitations/syncope (passing out). [place]   Follow-Up: Your physician recommends that you schedule a follow-up appointment in: 1 WEEK    Any Other Special Instructions Will Be Listed Below (If Applicable). You have been referred to DR. Lovena Le, SOMEONE FROM SCHEDULING WILL CALL YOU TO [place].    For patients with congestive heart failure, we give them these special instructions:  1. Follow a low-salt diet - you are allowed no more than 2,000mg  of sodium per day. Watch your fluid intake. In general, you should not be taking in more than 2 liters of fluid per day (no more than 8 glasses per day). This includes sources of water in foods like soup, coffee, tea, milk, etc. 2. Weigh patient daily on the same scale at same time of day and keep a log. 3. PLEASE CALL THE OFFICE (Anytime you feel any of the following symptoms)  - 3lb weight gain overnight or 5lb within a few days - Shortness of breath, with or without a dry hacking cough  - Swelling in the hands, feet or stomach  - If you have to sleep on extra pillows at night in order to breathe     If you need a refill on your cardiac medications before your next appointment, please call your pharmacy.

## 2017-08-21 ENCOUNTER — Inpatient Hospital Stay (INDEPENDENT_AMBULATORY_CARE_PROVIDER_SITE_OTHER): Payer: Medicare Other

## 2017-08-21 DIAGNOSIS — I5042 Chronic combined systolic (congestive) and diastolic (congestive) heart failure: Secondary | ICD-10-CM

## 2017-08-21 DIAGNOSIS — I471 Supraventricular tachycardia: Secondary | ICD-10-CM

## 2017-08-21 DIAGNOSIS — R000 Tachycardia, unspecified: Secondary | ICD-10-CM | POA: Diagnosis not present

## 2017-08-24 ENCOUNTER — Other Ambulatory Visit: Payer: Self-pay | Admitting: *Deleted

## 2017-08-24 NOTE — Patient Outreach (Signed)
[facility]) Care Management  08/24/2017  [name] 23-Aug-1966 [id number]   CSW met with patient at [facility] to discuss housing options. Patient reports that her daughter has gotten an application for [place] apartments but has not brought it to patient to complete. CSW spoke with Dawn at [facility] who is mailing application to [place] will forward to patient. CSW left message for [facility] apartments to inquire about availability & [address] apartments as well. Patient states that she plans to stay with her son, [name] when she [place] but would ultimately like to return to living on her own.   CSW will check back with patient in 2 weeks.    Raynaldo Opitz, LCSW [facility]  Clinical Social Worker cell #: [phone]

## 2017-08-26 ENCOUNTER — Non-Acute Institutional Stay (SKILLED_NURSING_FACILITY): Payer: Medicare Other | Admitting: Internal Medicine

## 2017-08-26 ENCOUNTER — Encounter: Payer: Self-pay | Admitting: Internal Medicine

## 2017-08-26 DIAGNOSIS — L03115 Cellulitis of right lower limb: Secondary | ICD-10-CM | POA: Diagnosis not present

## 2017-08-26 DIAGNOSIS — I5042 Chronic combined systolic (congestive) and diastolic (congestive) heart failure: Secondary | ICD-10-CM

## 2017-08-26 DIAGNOSIS — L97919 Non-pressure chronic ulcer of unspecified part of right lower leg with unspecified severity: Secondary | ICD-10-CM

## 2017-08-26 DIAGNOSIS — L97929 Non-pressure chronic ulcer of unspecified part of left lower leg with unspecified severity: Secondary | ICD-10-CM

## 2017-08-26 DIAGNOSIS — L03116 Cellulitis of left lower limb: Secondary | ICD-10-CM

## 2017-08-26 DIAGNOSIS — R000 Tachycardia, unspecified: Secondary | ICD-10-CM

## 2017-08-26 NOTE — Progress Notes (Signed)
Location:   [facility] Room Number: 121/D Place of Service:  SNF (31)  Provider: Veleta Miners  PCP: [name], MD Patient Care Team: [name], MD as PCP - General Madelin Headings, DO (Optometry) Standley Brooking, LCSW as [place] [employer] (Licensed Clinical Social Worker)  Extended Emergency Contact Information Primary Emergency Contact: Herbin,[name] Address: [address]          [place], [place] [address] [place] Phone: [phone] Mobile Phone: [phone] Relation: Son Secondary Emergency Contact: Herbin,[name] Address: [address]          [place], [place] [address] [place] Phone: [phone] Relation: Daughter  Code Status: Full Code Goals of care:  Advanced Directive information Advanced Directives 08/26/2017  Does Patient Have a Medical Advance Directive? Yes  Type of Advance Directive (No Data)  Does patient want to make changes to medical advance directive? No - Patient declined  Would patient like information on creating a medical advance directive? No - Patient declined  Pre-existing out of facility DNR order (yellow form or pink MOST form) -  Some encounter information is confidential and restricted. Go to Review Flowsheets activity to see all data.     No Known Allergies  Chief Complaint  Patient presents with  . Discharge Note    Discharge Visit    HPI:  51 y.o. female  Seen today for Discharge from the facility.  She has h/o Alcohol Abuse, Polysubstance abuse, Hypertensive Cardiomyopathy with EF of 30-35% in 08/18, Recent Thalamic CVA in 08/18, Diabetes Type 2 uncontrolled with Neuropathy, COPD , Non Compliance and Depression, Chronic Kidney Disease with Baseline creat of 1.8, Hyperlipidemia  Patient  Was admitted initially  in the hospital on  06/21/17 after she had stopped all her meds with Respiratory Failure, Acute CVA, CHF. She was  discharged to  SNF on 06/30/17.   Patient says in that facility  she developed Swelling in her Legs and ulcers .   She was discharged home and Nurse at home noticed that she was developing Foul smell from  her Wound. She was admitted to the Hospital on 08/02/17. She was treated with IV antibiotics and Wound care. She was eventually changed to PO antibiotics.  Her Hospital course was complicated with Tachyarrhythmia. She was started on Coreg. Loading dose of Amiodarone and Lisinopril for her Cardiomyopathy. She was discharged to the facility for Therapy and wound care.  Patient has done well with therapy in the facility. Her wounds are still very Painful and needing Regular Dressing .She is walking with the walker and able to do her transports. She was taken off her Lasix and Lisinopril due to Creat increasing. Per cardiology to keep her off them and started on her on Hydralazine. She also has Holter for heart monitoring. Her Lantus was reduced also due to Hypoglycemia. Patient is planning to go with her Son and will be followed by Home health    Past Medical History:  Diagnosis Date  . Alcohol use   . Ankle fracture, lateral malleolus, closed 12/30/2011  . Anxiety   . Breast mass, left 12/15/2011  . Chronic anemia   . Chronic combined systolic and diastolic CHF (congestive heart failure) ([place])   . CKD (chronic kidney disease), stage III ([place])   . Cocaine abuse ([place])   . COPD (chronic obstructive pulmonary disease) ([place])   . Diabetes mellitus, type 2 ([place])   . Diabetic Charcot foot (  [place]) 12/30/2011  . Essential hypertension   . History of GI bleed 12/05/2009   Qualifier: Diagnosis of  By: Zeb Comfort    . Hyperlipidemia   . Hypertensive cardiomyopathy ([place]) 11/08/2012   a. 05/2016: echo showing EF of 20-25% with normal cors by cath in 07/2016.  Marland Kitchen Noncompliance with medication regimen   . Obesity   . Panic attacks   . PAT (paroxysmal atrial tachycardia) ([place])    a. started on  amiodarone 07/2017 for this.  . Tobacco abuse     Past Surgical History:  Procedure Laterality Date  . BREAST BIOPSY    . CARDIAC CATHETERIZATION N/A 07/28/2016   Procedure: Left Heart Cath and Coronary Angiography;  Surgeon: Jettie Booze, MD;  Location: [facility];  Service: Cardiovascular;  Laterality: N/A;  . DILATION AND CURETTAGE OF UTERUS        reports that she has been smoking Cigarettes.  She has a 20.00 pack-year smoking history. She has never used smokeless tobacco. She reports that she uses drugs, including Marijuana and Cocaine, about 3 times per week. She reports that she does not drink alcohol. Social History   Social History  . Marital status: Widowed    Spouse name: N/A  . Number of children: 2  . Years of education: N/A   Occupational History  . Not on file.   Social History Main Topics  . Smoking status: Current Every Day Smoker    Packs/day: 1.00    Years: 20.00    Types: Cigarettes    Last attempt to quit: 06/03/2016  . Smokeless tobacco: Never Used  . Alcohol use No     Comment: Quit in 2017  . Drug use: Yes    Frequency: 3.0 times per week    Types: Marijuana, Cocaine  . Sexual activity: Not Currently    Birth control/ protection: None   Other Topics Concern  . Not on file   Social History Narrative   Currently living in a motel. Stopped taking all her medicines because she was "fed up"   Functional Status Survey:    No Known Allergies  Pertinent  Health Maintenance Due  Topic Date Due  . INFLUENZA VACCINE  09/25/2017 (Originally 05/26/2017)  . FOOT EXAM  09/25/2017 (Originally 06/18/2017)  . MAMMOGRAM  09/25/2017 (Originally 07/31/2016)  . URINE MICROALBUMIN  09/25/2017 (Originally 09/02/2016)  . COLONOSCOPY  09/25/2017 (Originally 08/23/2016)  . HEMOGLOBIN A1C  12/23/2017  . OPHTHALMOLOGY EXAM  03/16/2018  . PAP SMEAR  05/08/2019    Medications: Outpatient Encounter Prescriptions as of 08/26/2017  Medication Sig  .  amiodarone (PACERONE) 200 MG tablet Take 1 tablet (200 mg total) by mouth 2 (two) times daily.  Marland Kitchen aspirin EC 81 MG EC tablet Take 1 tablet (81 mg total) by mouth daily.  . Azelastine HCl (ASTEPRO) 0.15 % SOLN Place 1 spray into the nose every 12 (twelve) hours as needed (for allergies).   . carvedilol (COREG) 25 MG tablet Take 1 tablet (25 mg total) by mouth 2 (two) times daily with a meal.  . cloNIDine (CATAPRES) 0.1 MG tablet Take 1 tablet (0.1 mg total) by mouth at bedtime.  . clopidogrel (PLAVIX) 75 MG tablet TAKE 1 TABLET BY MOUTH DAILY WITH BREAKFAST.  . collagenase (SANTYL) ointment Apply 1 application topically daily. To affected area  . ezetimibe (ZETIA) 10 MG tablet Take 1 tablet (10 mg total) by mouth daily.  . fenofibrate micronized (LOFIBRA) 200 MG capsule Take 200 mg by  mouth daily before breakfast.  . FLUoxetine (PROZAC) 20 MG capsule TAKE 1 CAPSULE BY MOUTH EVERY DAY.  . fluticasone (FLONASE) 50 MCG/ACT nasal spray Place 2 sprays into both nostrils daily.  . folic acid (FOLVITE) 1 MG tablet Take 1 tablet (1 mg total) by mouth daily.  Marland Kitchen gabapentin (NEURONTIN) 100 MG capsule Take 200 mg by mouth 3 (three) times daily.   Marland Kitchen guaiFENesin (MUCINEX) 600 MG 12 hr tablet Take 600 mg by mouth 2 (two) times daily.  . hydrALAZINE (APRESOLINE) 25 MG tablet Take 0.5 tablets (12.5 mg total) by mouth 3 (three) times daily.  . Insulin Glargine (LANTUS SOLOSTAR) 100 UNIT/ML Solostar Pen Inject 5 Units into the skin daily at 10 pm.   . ipratropium-albuterol (DUONEB) 0.5-2.5 (3) MG/3ML SOLN Take 3 mLs by nebulization every 6 (six) hours as needed.  . montelukast (SINGULAIR) 10 MG tablet TAKE (1) TABLET BY MOUTH AT BEDTIME.  . Multiple Vitamin (MULTIVITAMIN WITH MINERALS) TABS tablet Take 1 tablet by mouth daily.  Marland Kitchen oxyCODONE-acetaminophen (PERCOCET/ROXICET) 5-325 MG tablet Take 1 tablet by mouth every 6 (six) hours as needed for moderate pain.  . pantoprazole (PROTONIX) 40 MG tablet Take 1 tablet (40  mg total) by mouth daily.  . potassium chloride SA (K-DUR,KLOR-CON) 20 MEQ tablet TAKE 1&1/2 TABLET BY MOUTH DAILY.  Marland Kitchen PROAIR HFA 108 (90 Base) MCG/ACT inhaler INHALE 2 PUFFS EVERY 6 HOURS AS NEEDED FOR SHORTNESS OF BREATH/WHEEZING.  . rosuvastatin (CRESTOR) 40 MG tablet Take 1 tablet (40 mg total) by mouth daily.  Marland Kitchen thiamine 100 MG tablet Take 1 tablet (100 mg total) by mouth daily.  Marland Kitchen tiotropium (SPIRIVA HANDIHALER) 18 MCG inhalation capsule Place 18 mcg into inhaler and inhale daily. 2 puffs  . TRADJENTA 5 MG TABS tablet TAKE 1 TABLET BY MOUTH ONCE A DAY.  . traZODone (DESYREL) 50 MG tablet TAKE (1) TABLET BY MOUTH AT BEDTIME.  Marland Kitchen Vitamin D, Ergocalciferol, (DRISDOL) 50000 units CAPS capsule Take 1 capsule (50,000 Units total) by mouth once a week.  . [DISCONTINUED] Amino Acids-Protein Hydrolys (FEEDING SUPPLEMENT, PRO-STAT SUGAR FREE 64,) LIQD Take 30 mLs by mouth 2 (two) times daily.  . [DISCONTINUED] insulin aspart (NOVOLOG) 100 UNIT/ML injection Inject 0-9 Units into the skin 3 (three) times daily with meals.   No facility-administered encounter medications on file as of 08/26/2017.      Review of Systems  Vitals:   08/26/17 1033  BP: 138/61  Pulse: 75  Resp: 20  Temp: (!) 97.2 F (36.2 C)  TempSrc: Oral   There is no height or weight on file to calculate BMI. Physical Exam  Constitutional: She is oriented to person, place, and time. She appears well-developed.  HENT:  Head: Normocephalic.  Mouth/Throat: Oropharynx is clear and moist.  Eyes: Pupils are equal, round, and reactive to light.  Neck: Neck supple.  Cardiovascular: Normal rate.   Murmur heard. Pulmonary/Chest: Effort normal and breath sounds normal. No respiratory distress. She has no wheezes.  Abdominal: Soft. Bowel sounds are normal. She exhibits no distension. There is no tenderness. There is no rebound.  Musculoskeletal: She exhibits no edema.  Neurological: She is alert and oriented to person, place, and  time.  Skin: Skin is warm and dry.  Has venous stasis Wounds in Both her LE. Total of almost 9 wounds in different stages of healing with Necrosis.  Psychiatric: She has a normal mood and affect. Her behavior is normal. Thought content normal.    Labs reviewed: Basic Metabolic Panel:  Recent Labs  06/28/17 0218 06/29/17 0540 06/30/17 0540  08/02/17 2108  08/09/17 1130 08/16/17 0700 08/19/17 0704  NA 140 142 141  --  144  < > 139 137 138  K 4.3 4.2 4.1  --  3.9  < > 4.1 4.7 5.1  CL 110 111 111  --  108  < > 102 101 106  CO2 23 22 21*  --  24  < > 28 27 24   GLUCOSE 151* 138* 121*  --  95  < > 152* 48* 168*  BUN 26* 24* 22*  --  21*  < > 25* 39* 31*  CREATININE 1.83* 1.89* 1.97*  < > 1.26*  < > 1.50* 2.11* 1.87*  CALCIUM 8.1* 8.5* 8.5*  --  9.5  < > 8.9 9.1 8.7*  MG 1.8 1.7 1.7  --  1.7  --   --   --   --   PHOS 2.5 2.5 2.7  --   --   --   --   --   --   < > = values in this interval not displayed. Liver Function Tests:  Recent Labs  06/29/17 0540 06/30/17 0540 08/03/17 0450  AST 30 33 18  ALT 24 27 17   ALKPHOS 125 108 48  BILITOT 0.5 0.9 0.8  PROT 5.2* 5.3* 6.3*  ALBUMIN 2.0* 2.0* 2.3*   No results for input(s): LIPASE, AMYLASE in the last 8760 hours.  Recent Labs  06/24/17 2156  AMMONIA 63*   CBC:  Recent Labs  08/03/17 0450 08/04/17 0958 08/09/17 1130 08/16/17 0700  WBC 10.5 9.4 10.6* 10.9*  NEUTROABS 8.6* 7.6 8.2*  --   HGB 9.3* 10.2* 10.0* 10.5*  HCT 29.8* 32.8* 32.3* 34.6*  MCV 99.0 98.5 98.5 98.9  PLT 401* 400 427* 525*   Cardiac Enzymes:  Recent Labs  06/21/17 1637 06/21/17 2129 06/22/17 0337 06/23/17 0631  CKTOTAL 63  --   --  40  CKMB  --   --   --  5.2*  TROPONINI 0.32* 0.46* 0.41*  --    BNP: Invalid input(s): POCBNP CBG:  Recent Labs  08/05/17 2054 08/06/17 0754 08/06/17 1117  GLUCAP 106* 109* 129*    Procedures and Imaging Studies During Stay: US Arterial Abi (screening Lower Extremity)  Result Date:  08/04/2017 CLINICAL DATA:  51 year old female with a history of leg wounds. Cardiovascular risk factors include smoking, hypertension, hyperlipidemia, diabetes EXAM: NONINVASIVE PHYSIOLOGIC VASCULAR STUDY OF BILATERAL LOWER EXTREMITIES TECHNIQUE: Evaluation of both lower extremities was performed at rest, including calculation of ankle-brachial indices, multiple segmental pressure evaluation, segmental Doppler and segmental pulse volume recording. COMPARISON:  None. FINDINGS: Right ABI:  Not obtained Left ABI:  1.18 Right Lower Extremity: Segmental Doppler of the right lower extremity demonstrates probable monophasic signal Left Lower Extremity: Segmental Doppler of the left lower extremity demonstrates probable monophasic signal IMPRESSION: Right: Resting ankle-brachial index on the right was not obtained. Segmental Doppler suggests monophasic signal at the ankle, indicating more proximal arterial occlusive disease. If there is concern for anatomic imaging, CT angiogram and runoff may be considered. Left: Resting ankle-brachial index within normal limits, though potentially falsely elevated secondary to noncompressible vessels, as the segmental Doppler suggests proximal vascular disease. Again, if there is concern for anatomic imaging, CT angiogram and runoff may be useful. Signed, Dulcy Fanny. Earleen Newport, DO [facility] Electronically Signed   By: Corrie Mckusick D.O.   On: 08/04/2017 15:36   Dg  Foot Complete Right  Result Date: 08/02/2017 CLINICAL DATA:  Multiple wounds on her legs, RIGHT foot has begun to have foul smell when home health nurse came to change dressings today EXAM: RIGHT FOOT COMPLETE - 3+ VIEW COMPARISON:  11/11/2009 FINDINGS: Mild diffuse osseous demineralization. Scattered soft tissue swelling. Pes planus. Small plantar calcaneal spur. Ossicle at dorsal margin of the tarsal navicular, appears corticated and old. Nondisplaced fracture  identified at base of proximal phalanx third toe. No additional fracture, dislocation, or bone destruction. IMPRESSION: Extensive soft tissue swelling and pes planus with midfoot degenerative changes. Nondisplaced fracture at base of proximal phalanx RIGHT third toe. Electronically Signed   By: Lavonia Dana M.D.   On: 08/02/2017 23:40    Assessment/Plan:    Cellulitis of both lower extremities with Multiple Ulcers Was on Clindamycin.Finished course D/w The Wound care Nurse. Will continue Santyl for Debridement at home .Cellulitis seems to have resolved Does not need surgical debridement right now. . And swelling is resolved with Wrapping.  DM (diabetes mellitus), type 2, uncontrolled, Patients A1C was more then 15 in the hospital in 08/18 Her BS are running higher again as she is eating well. Will increase her Lantus to 8 units. Also on Tradjenta  Chronic systolic (congestive) heart failure  She is oN coreg. Lasix and Lisinopril discontinued due to worsening renal function. She will weigh hersef everyday and will report to Cardiology if her weight changes more then 3 lbs.  Tachyarrhythmia Amiodarone and Coreg. Follow up with Cardiology.Has Holter mOnitor  Essential Hypertensin Clonidine will be discontinued. On Hydralazine now.  S/P CVA on  On Aspirin and Plavix.DPAT for 3 months from 08/18 and then Plavix alone.  CKD (chronic kidney disease) stage 3, GFR 30-59 ml/min  Renal function stable Baseline Creat 1.8. Follow up labs in 1 week.  COPD  On Bronchodilators. She is on Spiriva and Proair  Hyperlipidemia LDL goal <70 On Statin and Zetia and Fenofibrate.  Anxiety and depression Continuous to be in Prozac and trazadone  Polysubstance abuse (including cocaine) And Non Compliance D/w Patient in detail. She is planning to go with her Son and says she will be more Compliant . Would not use any drugs anymore.  Pain Control On Oxycodone, Neurontin . Will give her  Prescription for Oxycodone 14 tbs  Future labs/tests needed:  BMP and CBC in am and repeat in 1 week.  Total time spent more then 30 min.

## 2017-08-27 ENCOUNTER — Encounter (HOSPITAL_COMMUNITY)
Admission: RE | Admit: 2017-08-27 | Discharge: 2017-08-27 | Disposition: A | Discharge status: Home / Self Care | Payer: Medicare Other | Source: Skilled Nursing Facility | Attending: Internal Medicine | Admitting: Internal Medicine

## 2017-08-27 DIAGNOSIS — L03115 Cellulitis of right lower limb: Secondary | ICD-10-CM | POA: Insufficient documentation

## 2017-08-27 DIAGNOSIS — L03116 Cellulitis of left lower limb: Secondary | ICD-10-CM | POA: Insufficient documentation

## 2017-08-27 DIAGNOSIS — N183 Chronic kidney disease, stage 3 (moderate): Secondary | ICD-10-CM | POA: Insufficient documentation

## 2017-08-27 DIAGNOSIS — R262 Difficulty in walking, not elsewhere classified: Secondary | ICD-10-CM | POA: Insufficient documentation

## 2017-08-27 DIAGNOSIS — M6281 Muscle weakness (generalized): Secondary | ICD-10-CM | POA: Insufficient documentation

## 2017-08-27 LAB — BASIC METABOLIC PANEL
Anion gap: 9 (ref 5–15)
BUN: 28 mg/dL — ABNORMAL HIGH (ref 6–20)
CO2: 24 mmol/L (ref 22–32)
Calcium: 9.1 mg/dL (ref 8.9–10.3)
Chloride: 106 mmol/L (ref 101–111)
Creatinine, Ser: 1.84 mg/dL — ABNORMAL HIGH (ref 0.44–1.00)
GFR calc Af Amer: 35 mL/min — ABNORMAL LOW (ref >60)
GFR calc non Af Amer: 30 mL/min — ABNORMAL LOW (ref >60)
Glucose, Bld: 145 mg/dL — ABNORMAL HIGH (ref 65–99)
Potassium: 4.7 mmol/L (ref 3.5–5.1)
Sodium: 139 mmol/L (ref 135–145)

## 2017-08-27 LAB — CBC WITH DIFFERENTIAL/PLATELET
Basophils Absolute: 0 10*3/uL (ref 0.0–0.1)
Basophils Relative: 0 %
Eosinophils Absolute: 0.2 10*3/uL (ref 0.0–0.7)
Eosinophils Relative: 2 %
HCT: 32.8 % — ABNORMAL LOW (ref 36.0–46.0)
Hemoglobin: 10.2 g/dL — ABNORMAL LOW (ref 12.0–15.0)
Lymphocytes Relative: 14 %
Lymphs Abs: 1.4 10*3/uL (ref 0.7–4.0)
MCH: 30.1 pg (ref 26.0–34.0)
MCHC: 31.1 g/dL (ref 30.0–36.0)
MCV: 96.8 fL (ref 78.0–100.0)
Monocytes Absolute: 0.6 10*3/uL (ref 0.1–1.0)
Monocytes Relative: 6 %
Neutro Abs: 7.4 10*3/uL (ref 1.7–7.7)
Neutrophils Relative %: 78 %
Platelets: 334 10*3/uL (ref 150–400)
RBC: 3.39 MIL/uL — ABNORMAL LOW (ref 3.87–5.11)
RDW: 15.8 % — ABNORMAL HIGH (ref 11.5–15.5)
WBC: 9.5 10*3/uL (ref 4.0–10.5)

## 2017-08-27 NOTE — Progress Notes (Signed)
Cardiology Office Note   Date:  08/30/2017   ID:  [name], DOB 20-Mar-1966, MRN [id number]  PCP:  [name], MD  Cardiologist: Kate Sable, MD  Chief Complaint  Patient presents with  . Cardiomyopathy  . Congestive Heart Failure      History of Present Illness: [name] is a 51 y.o. female who presents for ongoing assessment and management of nonischemic cardiomyopathy, chronic combined CHF (most recent echocardiogram revealing an EF of 20% to 25%), normal coronary arteries per catheterization October 2017, other history to include CVA, chronic kidney disease stage III, uncontrolled diabetes, history of polysubstance abuse with EtOH and cocaine, and multiple medical problems.  She was last seen in the office on 08/20/2017 by Melina Copa, PA.  He was seen on follow-up after medication changes in the setting of atrial tachycardia.  She had been placed by carvedilol and taken off of bisoprolol during recent hospitalization and also started on amiodarone 400 mg twice daily.  On that office visit, amiodarone was decreased to 200 mg daily, clonidine was decreased to 0.1 mg at at bedtime, and a hydralazine 12.5 mg 3 times daily was started.  The goal was to get her on a combination of nitrates and hydralazine for optimization of cardiac medications in the setting of significantly reduced LV systolic function and overall heart failure maintenance program. There was consideration for EP evaluation for ICD implantation, once she was medically compliant and stable  She is here today having not taken her medications in over 3 days.  She was released from the [facility] 3 days ago but has not been taking medications at home since.  Her son cares for her but has lost her medications as does not know where that they have been placed.  The patient is hypertensive, with some mild lower extremity edema, and having mild shortness of breath.  Her medications [facility] from [facility]" and  are in a bubble pack.  They have not been sent to their home as she has been in a skilled nursing facility.  Her sister who is with her, has been in contact with the medication distribution center who plans on sending out her medications today.  She is also being followed by home health nurse who is wrapping her legs.   Past Medical History:  Diagnosis Date  . Alcohol use   . Ankle fracture, lateral malleolus, closed 12/30/2011  . Anxiety   . Breast mass, left 12/15/2011  . Chronic anemia   . Chronic combined systolic and diastolic CHF (congestive heart failure) ([place])   . CKD (chronic kidney disease), stage III ([place])   . Cocaine abuse ([place])   . COPD (chronic obstructive pulmonary disease) ([place])   . Diabetes mellitus, type 2 ([place])   . Diabetic Charcot foot ([place]) 12/30/2011  . Essential hypertension   . History of GI bleed 12/05/2009   Qualifier: Diagnosis of  By: Zeb Comfort    . Hyperlipidemia   . Hypertensive cardiomyopathy ([place]) 11/08/2012   a. 05/2016: echo showing EF of 20-25% with normal cors by cath in 07/2016.  Marland Kitchen Noncompliance with medication regimen   . Obesity   . Panic attacks   . PAT (paroxysmal atrial tachycardia) ([place])    a. started on amiodarone 07/2017 for this.  . Tobacco abuse     Past Surgical History:  Procedure Laterality Date  . BREAST BIOPSY    . DILATION AND CURETTAGE OF UTERUS       Current  Outpatient Medications  Medication Sig Dispense Refill  . amiodarone (PACERONE) 200 MG tablet Take 1 tablet (200 mg total) by mouth 2 (two) times daily. (Patient not taking: Reported on 08/30/2017) 60 tablet 2  . aspirin EC 81 MG EC tablet Take 1 tablet (81 mg total) by mouth daily. (Patient not taking: Reported on 08/30/2017) 30 tablet 0  . Azelastine HCl (ASTEPRO) 0.15 % SOLN Place 1 spray into the nose every 12 (twelve) hours as needed (for allergies).     . carvedilol (COREG) 25 MG tablet Take 1 tablet (25 mg total) by mouth 2 (two) times daily with a meal.  (Patient not taking: Reported on 08/30/2017)    . clopidogrel (PLAVIX) 75 MG tablet TAKE 1 TABLET BY MOUTH DAILY WITH BREAKFAST. (Patient not taking: Reported on 08/30/2017) 30 tablet 0  . collagenase (SANTYL) ointment Apply 1 application topically daily. To affected area    . ezetimibe (ZETIA) 10 MG tablet Take 1 tablet (10 mg total) by mouth daily. (Patient not taking: Reported on 08/30/2017) 90 tablet 1  . fenofibrate micronized (LOFIBRA) 200 MG capsule Take 200 mg by mouth daily before breakfast.    . FLUoxetine (PROZAC) 20 MG capsule TAKE 1 CAPSULE BY MOUTH EVERY DAY. (Patient not taking: Reported on 08/30/2017) 30 capsule 0  . fluticasone (FLONASE) 50 MCG/ACT nasal spray Place 2 sprays into both nostrils daily. (Patient not taking: Reported on 17/03/1606) 48 g 1  . folic acid (FOLVITE) 1 MG tablet Take 1 tablet (1 mg total) by mouth daily. (Patient not taking: Reported on 08/30/2017) 30 tablet 0  . gabapentin (NEURONTIN) 100 MG capsule Take 200 mg by mouth 3 (three) times daily.     Marland Kitchen guaiFENesin (MUCINEX) 600 MG 12 hr tablet Take 600 mg by mouth 2 (two) times daily.    . hydrALAZINE (APRESOLINE) 25 MG tablet Take 0.5 tablets (12.5 mg total) by mouth 3 (three) times daily. (Patient not taking: Reported on 08/30/2017) 135 tablet 3  . Insulin Glargine (LANTUS SOLOSTAR) 100 UNIT/ML Solostar Pen Inject 5 Units into the skin daily at 10 pm.     . ipratropium-albuterol (DUONEB) 0.5-2.5 (3) MG/3ML SOLN Take 3 mLs by nebulization every 6 (six) hours as needed.    . montelukast (SINGULAIR) 10 MG tablet TAKE (1) TABLET BY MOUTH AT BEDTIME. (Patient not taking: Reported on 08/30/2017) 30 tablet 0  . Multiple Vitamin (MULTIVITAMIN WITH MINERALS) TABS tablet Take 1 tablet by mouth daily. (Patient not taking: Reported on 08/30/2017) 30 tablet 0  . oxyCODONE-acetaminophen (PERCOCET/ROXICET) 5-325 MG tablet Take 1 tablet by mouth every 6 (six) hours as needed for moderate pain. (Patient not taking: Reported on  08/30/2017) 60 tablet 0  . pantoprazole (PROTONIX) 40 MG tablet Take 1 tablet (40 mg total) by mouth daily. (Patient not taking: Reported on 08/30/2017) 90 tablet 1  . potassium chloride SA (K-DUR,KLOR-CON) 20 MEQ tablet TAKE 1&1/2 TABLET BY MOUTH DAILY. (Patient not taking: Reported on 08/30/2017) 45 tablet 0  . PROAIR HFA 108 (90 Base) MCG/ACT inhaler INHALE 2 PUFFS EVERY 6 HOURS AS NEEDED FOR SHORTNESS OF BREATH/WHEEZING. (Patient not taking: Reported on 08/30/2017) 8.5 g 0  . rosuvastatin (CRESTOR) 40 MG tablet Take 1 tablet (40 mg total) by mouth daily. (Patient not taking: Reported on 08/30/2017)    . thiamine 100 MG tablet Take 1 tablet (100 mg total) by mouth daily. (Patient not taking: Reported on 08/30/2017) 30 tablet 0  . tiotropium (SPIRIVA HANDIHALER) 18 MCG inhalation capsule Place 18 mcg  into inhaler and inhale daily. 2 puffs    . TRADJENTA 5 MG TABS tablet TAKE 1 TABLET BY MOUTH ONCE A DAY. (Patient not taking: Reported on 08/30/2017) 30 tablet 0  . traZODone (DESYREL) 50 MG tablet TAKE (1) TABLET BY MOUTH AT BEDTIME. (Patient not taking: Reported on 08/30/2017) 30 tablet 0  . Vitamin D, Ergocalciferol, (DRISDOL) 50000 units CAPS capsule Take 1 capsule (50,000 Units total) by mouth once a week. (Patient not taking: Reported on 08/30/2017) 12 capsule 1   No current facility-administered medications for this visit.     Allergies:   Patient has no known allergies.    Social History:  The patient  reports that she quit smoking about 2 months ago. Her smoking use included cigarettes. She has a 20.00 pack-year smoking history. she has never used smokeless tobacco. She reports that she uses drugs. Drugs: Marijuana and Cocaine. Frequency: 3.00 times per week. She reports that she does not drink alcohol.   Family History:  The patient's family history includes Arthritis in her unknown relative; Asthma in her unknown relative; Cancer in her sister and unknown relative; Diabetes in her unknown  relative; Heart attack in her mother; Hypertension in her brother, daughter, father, mother, sister, sister, and son.    ROS: All other systems are reviewed and negative. Unless otherwise mentioned in H&P    PHYSICAL EXAM: VS:  BP (!) 170/110   Pulse 92   Ht 5\' 1"  (1.549 m)   LMP 05/19/2016   SpO2 99%   BMI 24.95 kg/m  , BMI Body mass index is 24.95 kg/m. GEN: Well nourished, well developed, in no acute distress  HEENT: normal  Neck: no JVD, carotid bruits, or masses Cardiac: RRR; no murmurs, rubs, or gallops,no edema  Respiratory:  clear to auscultation bilaterally, normal work of breathing GI: soft, nontender, nondistended, + BS MS: no deformity or atrophy  Skin: warm and dry, no rash Neuro:  Strength and sensation are intact Psych: euthymic mood, full affect     Recent Labs: Jul 18, 2017: B Natriuretic Peptide 835.0 08/02/2017: Magnesium 1.7 08/03/2017: ALT 17 08/27/2017: BUN 28; Creatinine, Ser 1.84; Hemoglobin 10.2; Platelets 334; Potassium 4.7; Sodium 139    Lipid Panel    Component Value Date/Time   CHOL 164 06/24/2017 2147   TRIG 87 06/24/2017 2147   HDL 51 06/24/2017 2147   CHOLHDL 3.2 06/24/2017 2147   VLDL 17 06/24/2017 2147   LDLCALC 96 06/24/2017 2147   LDLDIRECT 122 (H) 01/08/2010 0818      Wt Readings from Last 3 Encounters:  08/06/17 132 lb 0.9 oz (59.9 kg)  06/30/17 174 lb 4.8 oz (79.1 kg)  12/15/16 184 lb 1.9 oz (83.5 kg)      Other studies Reviewed: Echocardiogram 18-Jul-2017 Study Conclusions  - Left ventricle: The cavity size was normal. Wall thickness was   increased in a pattern of moderate LVH. Systolic function was   moderately to severely reduced. The estimated ejection fraction   was in the range of 30% to 35%. Diffuse hypokinesis. Features are   consistent with a pseudonormal left ventricular filling pattern,   with concomitant abnormal relaxation and increased filling   pressure (grade 2 diastolic dysfunction). - Mitral valve:  There was moderate regurgitation directed   posteriorly. - Left atrium: The atrium was mildly dilated.  ASSESSMENT AND PLAN:  1.  Nonischemic cardiomyopathy: Most recent ejection fraction of 35%.  The patient has been without her medications for 3 days.  Apparently she has been  at the [facility] and was not receiving home medications as directed as a have been managed by her son.  Apparently her son has misplaced her medications.  [facility] has [facility] which does mail her her medications in a bubble pack.  She is on multiple medications.  We will call them to make sure that the medication she is taking on their list corresponds to the medication she should be on.  Her sister who is with her is also following up with phone calls  2.  Hypertension: She is hypertensive on this office visit.  As stated above she is not received medication for 3 days.  I have asked for [facility] to pay her a visit to evaluate her current living situation and need for assistance concerning medications.  Her sister who is with her he is trying to get her into [facility]  skilled nursing facility.  [facility] may be able to help facilitate this.  3.  History of CVA: Remains on dual antiplatelet therapy.  4.  History of paroxysmal atrial fibrillation: She remains on amiodarone 200 mg twice daily, should be on daily, but since she has not been taking her medications for over 3 days would continue her on twice daily dosing for another week, and then decrease to daily.  Current medicines are reviewed at length with the patient today.  She will need to be seen again in 1 month for ongoing management response to medications and compliance issues.  Labs/ tests ordered today include:  Phill Myron. [place] Pugh, ANP, AACC   08/30/2017 3:06 PM    [facility] [address], [place],  [address] Phone: [phone]; Fax: [phone]

## 2017-08-29 ENCOUNTER — Encounter: Payer: Self-pay | Admitting: Internal Medicine

## 2017-08-29 DIAGNOSIS — M48061 Spinal stenosis, lumbar region without neurogenic claudication: Secondary | ICD-10-CM | POA: Diagnosis not present

## 2017-08-29 DIAGNOSIS — L988 Other specified disorders of the skin and subcutaneous tissue: Secondary | ICD-10-CM | POA: Diagnosis not present

## 2017-08-29 DIAGNOSIS — E11622 Type 2 diabetes mellitus with other skin ulcer: Secondary | ICD-10-CM | POA: Diagnosis not present

## 2017-08-29 DIAGNOSIS — N183 Chronic kidney disease, stage 3 (moderate): Secondary | ICD-10-CM | POA: Diagnosis not present

## 2017-08-29 DIAGNOSIS — I5021 Acute systolic (congestive) heart failure: Secondary | ICD-10-CM | POA: Diagnosis not present

## 2017-08-29 DIAGNOSIS — L97321 Non-pressure chronic ulcer of left ankle limited to breakdown of skin: Secondary | ICD-10-CM | POA: Diagnosis not present

## 2017-08-29 DIAGNOSIS — G834 Cauda equina syndrome: Secondary | ICD-10-CM | POA: Diagnosis not present

## 2017-08-29 DIAGNOSIS — L97211 Non-pressure chronic ulcer of right calf limited to breakdown of skin: Secondary | ICD-10-CM | POA: Diagnosis not present

## 2017-08-29 DIAGNOSIS — L97821 Non-pressure chronic ulcer of other part of left lower leg limited to breakdown of skin: Secondary | ICD-10-CM | POA: Diagnosis not present

## 2017-08-29 DIAGNOSIS — I130 Hypertensive heart and chronic kidney disease with heart failure and stage 1 through stage 4 chronic kidney disease, or unspecified chronic kidney disease: Secondary | ICD-10-CM | POA: Diagnosis not present

## 2017-08-29 DIAGNOSIS — E1122 Type 2 diabetes mellitus with diabetic chronic kidney disease: Secondary | ICD-10-CM | POA: Diagnosis not present

## 2017-08-29 DIAGNOSIS — E1151 Type 2 diabetes mellitus with diabetic peripheral angiopathy without gangrene: Secondary | ICD-10-CM | POA: Diagnosis not present

## 2017-08-29 DIAGNOSIS — L97221 Non-pressure chronic ulcer of left calf limited to breakdown of skin: Secondary | ICD-10-CM | POA: Diagnosis not present

## 2017-08-30 ENCOUNTER — Telehealth: Payer: Self-pay | Admitting: Family Medicine

## 2017-08-30 ENCOUNTER — Other Ambulatory Visit: Payer: Self-pay | Admitting: *Deleted

## 2017-08-30 ENCOUNTER — Encounter: Payer: Self-pay | Admitting: Adult Health

## 2017-08-30 ENCOUNTER — Ambulatory Visit (INDEPENDENT_AMBULATORY_CARE_PROVIDER_SITE_OTHER): Payer: Medicare Other | Admitting: Adult Health

## 2017-08-30 VITALS — BP 170/110 | HR 92 | Ht 61.0 in

## 2017-08-30 DIAGNOSIS — I5042 Chronic combined systolic (congestive) and diastolic (congestive) heart failure: Secondary | ICD-10-CM | POA: Diagnosis not present

## 2017-08-30 DIAGNOSIS — I43 Cardiomyopathy in diseases classified elsewhere: Secondary | ICD-10-CM | POA: Diagnosis not present

## 2017-08-30 DIAGNOSIS — E785 Hyperlipidemia, unspecified: Secondary | ICD-10-CM | POA: Diagnosis not present

## 2017-08-30 DIAGNOSIS — I509 Heart failure, unspecified: Secondary | ICD-10-CM | POA: Diagnosis not present

## 2017-08-30 DIAGNOSIS — I10 Essential (primary) hypertension: Secondary | ICD-10-CM

## 2017-08-30 NOTE — Telephone Encounter (Signed)
[place] left message on nurse line requesting verbal orders for daily wound care with nursing and hopefully orders for social worker to transition patient into facility care- she believes home is not suitable for patient at this time  [phone]

## 2017-08-30 NOTE — Patient Outreach (Signed)
[facility]) Care Management  08/30/2017  PREZLEY QADIR [birth year] [id number]   [name] is referred to [facility] for nursing services after her recent discharge from [facility] on 08/26/17. She is also currently being followed by our social work team. [name] is a 51 year old patient familiar to our team from previous engagements.   [name] has a medical history which includes bilateral lower extremity cellulitis, DM type 2 - uncontrolled, chronic systolic heart failure, tachyarrythmia, essential hypertension, CVA, CKD, hyperlipidemia, anxiety and depression, and history of polysubstance abuse.   [name] was seen today in the cardiology office for follow up of her multiple cardiovascular problems. There is concern around medication management and adherence. This is an issue I will address with [name] if she is willing to engage with me.   Plan: I will reach out to [name] by phone tomorrow.    [facility]  [phone]

## 2017-08-30 NOTE — Patient Instructions (Signed)
Medication Instructions:  Your physician recommends that you continue on your current medications as directed. Please refer to the Current Medication list given to you today.   Labwork: NONE   Testing/Procedures: NONE   Follow-Up: Your physician recommends that you schedule a follow-up appointment in: 1 Month    Any Other Special Instructions Will Be Listed Below (If Applicable).  You have been referred to [facility]     If you need a refill on your cardiac medications before your next appointment, please call your pharmacy.  Thank you for choosing [facility]!

## 2017-08-31 ENCOUNTER — Encounter: Payer: Self-pay | Admitting: *Deleted

## 2017-08-31 ENCOUNTER — Other Ambulatory Visit: Payer: Self-pay | Admitting: *Deleted

## 2017-08-31 DIAGNOSIS — L97221 Non-pressure chronic ulcer of left calf limited to breakdown of skin: Secondary | ICD-10-CM | POA: Diagnosis not present

## 2017-08-31 DIAGNOSIS — N183 Chronic kidney disease, stage 3 (moderate): Secondary | ICD-10-CM | POA: Diagnosis not present

## 2017-08-31 DIAGNOSIS — L988 Other specified disorders of the skin and subcutaneous tissue: Secondary | ICD-10-CM | POA: Diagnosis not present

## 2017-08-31 DIAGNOSIS — E11622 Type 2 diabetes mellitus with other skin ulcer: Secondary | ICD-10-CM | POA: Diagnosis not present

## 2017-08-31 DIAGNOSIS — E1151 Type 2 diabetes mellitus with diabetic peripheral angiopathy without gangrene: Secondary | ICD-10-CM | POA: Diagnosis not present

## 2017-08-31 DIAGNOSIS — L97321 Non-pressure chronic ulcer of left ankle limited to breakdown of skin: Secondary | ICD-10-CM | POA: Diagnosis not present

## 2017-08-31 DIAGNOSIS — L97211 Non-pressure chronic ulcer of right calf limited to breakdown of skin: Secondary | ICD-10-CM | POA: Diagnosis not present

## 2017-08-31 DIAGNOSIS — I130 Hypertensive heart and chronic kidney disease with heart failure and stage 1 through stage 4 chronic kidney disease, or unspecified chronic kidney disease: Secondary | ICD-10-CM | POA: Diagnosis not present

## 2017-08-31 DIAGNOSIS — I5021 Acute systolic (congestive) heart failure: Secondary | ICD-10-CM | POA: Diagnosis not present

## 2017-08-31 DIAGNOSIS — L97821 Non-pressure chronic ulcer of other part of left lower leg limited to breakdown of skin: Secondary | ICD-10-CM | POA: Diagnosis not present

## 2017-08-31 DIAGNOSIS — E1122 Type 2 diabetes mellitus with diabetic chronic kidney disease: Secondary | ICD-10-CM | POA: Diagnosis not present

## 2017-08-31 DIAGNOSIS — G834 Cauda equina syndrome: Secondary | ICD-10-CM | POA: Diagnosis not present

## 2017-08-31 DIAGNOSIS — M48061 Spinal stenosis, lumbar region without neurogenic claudication: Secondary | ICD-10-CM | POA: Diagnosis not present

## 2017-08-31 NOTE — Telephone Encounter (Signed)
Do you agree with both recommendations?

## 2017-08-31 NOTE — Telephone Encounter (Signed)
Agree 

## 2017-08-31 NOTE — Patient Outreach (Signed)
[facility] Care Management  08/31/2017  [name] 1966/07/24 [id number]   Telephone outreach today to [name].   [name] is referred to [facility] for nursing services after her recent discharge from [facility] on 08/26/17. She is also currently being followed by our social work team.PDr. Jory Sims saw [name] in the office yesterday and Ms [name] stated she wishes to be placed at [facility] facility long term.    [name] is a 51 year old female patient living in [place] and has a medical history which includes bilateral lower extremity cellulitis, DM type 2 - uncontrolled, chronic systolic heart failure, tachyarrythmia, essential hypertension, CVA, CKD, hyperlipidemia, anxiety and depression, and history of polysubstance abuse.   [name] was seen yesterday in the cardiology office for follow up of her multiple cardiovascular problems. There is concern around medication management and adherence and her provider reports that [name] has not taken any medications since discharge from [facility]. Per Dr. Elna Breslow collaboration with pharmacy and family, no medications have been delivered as the pharmacy was unaware of her discharge to home. [name] sister and son are working together to get [name] medications and manage them for the short term.   Level of Care Concerns - I spoke with [name] by phone today. She is staying in an apartment in [place] ([address]) with her son  Temporarily. She says she does not want to stay there long term and does not want to stay with her daughter in [place], long term. She said she wanted to stay at [facility] as a long term resident but was told they did not have a long term bed available. She also told me that her Medicaid "ran out" and that she needs to reapply. [name] told me today that she would like to be placed somewhere local  and prefers [facility] if possible. I told her that I was aware our social worker is working on her case and I would collaborate with her re: any way I might support this healthcare related need.   Skin Care Needs - [name] home health nurse came on Sunday to open her case and performed a dressing change at that time. [name] told me she was expecting the home health nurse this morning but she had not arrived. I reached out to [facility] and spoke with the nursing supervisor [name]. [name] stated that she'd spoken with the nurse very recently and anticipated that BarbaraJames would be seen by her within the hour. I returned a call to [name] to notify her of the nurse's anticipated arrival time.   Medication Management Needs - [name] could not tell me if she is correctly taking or even has all of her medications today. She says her home health nurse has not yet arrived and she wanted her to go over her medications with her. As noted previously, the home health nurse is scheduled to arrive this afternoon.   [name]: I will collaborate with Raynaldo Opitz LCSW [facility]) re: [name] level of care needs. I will follow up with [name] over the next week re: ongoing dressings/skin care and medication management needs.    [facility]  [phone]

## 2017-09-01 ENCOUNTER — Telehealth: Payer: Self-pay | Admitting: Family Medicine

## 2017-09-01 DIAGNOSIS — G834 Cauda equina syndrome: Secondary | ICD-10-CM | POA: Diagnosis not present

## 2017-09-01 DIAGNOSIS — L988 Other specified disorders of the skin and subcutaneous tissue: Secondary | ICD-10-CM | POA: Diagnosis not present

## 2017-09-01 DIAGNOSIS — E11622 Type 2 diabetes mellitus with other skin ulcer: Secondary | ICD-10-CM | POA: Diagnosis not present

## 2017-09-01 DIAGNOSIS — L97211 Non-pressure chronic ulcer of right calf limited to breakdown of skin: Secondary | ICD-10-CM | POA: Diagnosis not present

## 2017-09-01 DIAGNOSIS — E1122 Type 2 diabetes mellitus with diabetic chronic kidney disease: Secondary | ICD-10-CM | POA: Diagnosis not present

## 2017-09-01 DIAGNOSIS — I130 Hypertensive heart and chronic kidney disease with heart failure and stage 1 through stage 4 chronic kidney disease, or unspecified chronic kidney disease: Secondary | ICD-10-CM | POA: Diagnosis not present

## 2017-09-01 DIAGNOSIS — M48061 Spinal stenosis, lumbar region without neurogenic claudication: Secondary | ICD-10-CM | POA: Diagnosis not present

## 2017-09-01 DIAGNOSIS — N183 Chronic kidney disease, stage 3 (moderate): Secondary | ICD-10-CM | POA: Diagnosis not present

## 2017-09-01 DIAGNOSIS — L97821 Non-pressure chronic ulcer of other part of left lower leg limited to breakdown of skin: Secondary | ICD-10-CM | POA: Diagnosis not present

## 2017-09-01 DIAGNOSIS — L97221 Non-pressure chronic ulcer of left calf limited to breakdown of skin: Secondary | ICD-10-CM | POA: Diagnosis not present

## 2017-09-01 DIAGNOSIS — E1151 Type 2 diabetes mellitus with diabetic peripheral angiopathy without gangrene: Secondary | ICD-10-CM | POA: Diagnosis not present

## 2017-09-01 DIAGNOSIS — I5021 Acute systolic (congestive) heart failure: Secondary | ICD-10-CM | POA: Diagnosis not present

## 2017-09-01 DIAGNOSIS — L97321 Non-pressure chronic ulcer of left ankle limited to breakdown of skin: Secondary | ICD-10-CM | POA: Diagnosis not present

## 2017-09-01 NOTE — Addendum Note (Signed)
Addended by: Levonne Hubert on: 09/01/2017 08:11 AM   Modules accepted: Orders

## 2017-09-01 NOTE — Telephone Encounter (Signed)
Doris, Education officer, museum with [facility], left message on nurse line regarding patient. She left the message yesterday stating that she would be seeing patient today to discuss placement in a short term rehab facility. She wants to know if Dr. Moshe Cipro will sign FL-2 for patient?  Callback# [phone]

## 2017-09-02 ENCOUNTER — Telehealth: Payer: Self-pay | Admitting: Family Medicine

## 2017-09-02 DIAGNOSIS — I130 Hypertensive heart and chronic kidney disease with heart failure and stage 1 through stage 4 chronic kidney disease, or unspecified chronic kidney disease: Secondary | ICD-10-CM | POA: Diagnosis not present

## 2017-09-02 DIAGNOSIS — G834 Cauda equina syndrome: Secondary | ICD-10-CM | POA: Diagnosis not present

## 2017-09-02 DIAGNOSIS — I5021 Acute systolic (congestive) heart failure: Secondary | ICD-10-CM | POA: Diagnosis not present

## 2017-09-02 DIAGNOSIS — E11622 Type 2 diabetes mellitus with other skin ulcer: Secondary | ICD-10-CM | POA: Diagnosis not present

## 2017-09-02 DIAGNOSIS — L988 Other specified disorders of the skin and subcutaneous tissue: Secondary | ICD-10-CM | POA: Diagnosis not present

## 2017-09-02 DIAGNOSIS — N183 Chronic kidney disease, stage 3 (moderate): Secondary | ICD-10-CM | POA: Diagnosis not present

## 2017-09-02 DIAGNOSIS — L97221 Non-pressure chronic ulcer of left calf limited to breakdown of skin: Secondary | ICD-10-CM | POA: Diagnosis not present

## 2017-09-02 DIAGNOSIS — L97821 Non-pressure chronic ulcer of other part of left lower leg limited to breakdown of skin: Secondary | ICD-10-CM | POA: Diagnosis not present

## 2017-09-02 DIAGNOSIS — L97211 Non-pressure chronic ulcer of right calf limited to breakdown of skin: Secondary | ICD-10-CM | POA: Diagnosis not present

## 2017-09-02 DIAGNOSIS — E1122 Type 2 diabetes mellitus with diabetic chronic kidney disease: Secondary | ICD-10-CM | POA: Diagnosis not present

## 2017-09-02 DIAGNOSIS — M48061 Spinal stenosis, lumbar region without neurogenic claudication: Secondary | ICD-10-CM | POA: Diagnosis not present

## 2017-09-02 DIAGNOSIS — E1151 Type 2 diabetes mellitus with diabetic peripheral angiopathy without gangrene: Secondary | ICD-10-CM | POA: Diagnosis not present

## 2017-09-02 DIAGNOSIS — L97321 Non-pressure chronic ulcer of left ankle limited to breakdown of skin: Secondary | ICD-10-CM | POA: Diagnosis not present

## 2017-09-02 NOTE — Telephone Encounter (Signed)
Patient called and asked for a refill for her tramadol. She states she is in a lot of pain in her legs/feet and she needs 'some good pain pills to get her through' she states the hydrocodone did nothing for her. Only wants tramadol. She states she has an appt here Monday

## 2017-09-03 ENCOUNTER — Other Ambulatory Visit: Payer: Self-pay | Admitting: Family Medicine

## 2017-09-03 ENCOUNTER — Encounter: Payer: Self-pay | Admitting: Vascular Surgery

## 2017-09-03 ENCOUNTER — Ambulatory Visit (INDEPENDENT_AMBULATORY_CARE_PROVIDER_SITE_OTHER): Payer: Medicare Other | Admitting: Vascular Surgery

## 2017-09-03 VITALS — BP 144/105 | HR 68 | Temp 97.8°F | Resp 20 | Ht 61.0 in | Wt 138.0 lb

## 2017-09-03 DIAGNOSIS — E1151 Type 2 diabetes mellitus with diabetic peripheral angiopathy without gangrene: Secondary | ICD-10-CM | POA: Diagnosis not present

## 2017-09-03 DIAGNOSIS — I130 Hypertensive heart and chronic kidney disease with heart failure and stage 1 through stage 4 chronic kidney disease, or unspecified chronic kidney disease: Secondary | ICD-10-CM | POA: Diagnosis not present

## 2017-09-03 DIAGNOSIS — L97211 Non-pressure chronic ulcer of right calf limited to breakdown of skin: Secondary | ICD-10-CM | POA: Diagnosis not present

## 2017-09-03 DIAGNOSIS — E1152 Type 2 diabetes mellitus with diabetic peripheral angiopathy with gangrene: Secondary | ICD-10-CM

## 2017-09-03 DIAGNOSIS — L97821 Non-pressure chronic ulcer of other part of left lower leg limited to breakdown of skin: Secondary | ICD-10-CM | POA: Diagnosis not present

## 2017-09-03 DIAGNOSIS — E11621 Type 2 diabetes mellitus with foot ulcer: Secondary | ICD-10-CM | POA: Diagnosis not present

## 2017-09-03 DIAGNOSIS — E11622 Type 2 diabetes mellitus with other skin ulcer: Secondary | ICD-10-CM | POA: Diagnosis not present

## 2017-09-03 DIAGNOSIS — I5021 Acute systolic (congestive) heart failure: Secondary | ICD-10-CM | POA: Diagnosis not present

## 2017-09-03 DIAGNOSIS — L97321 Non-pressure chronic ulcer of left ankle limited to breakdown of skin: Secondary | ICD-10-CM | POA: Diagnosis not present

## 2017-09-03 DIAGNOSIS — L97221 Non-pressure chronic ulcer of left calf limited to breakdown of skin: Secondary | ICD-10-CM | POA: Diagnosis not present

## 2017-09-03 DIAGNOSIS — L97509 Non-pressure chronic ulcer of other part of unspecified foot with unspecified severity: Secondary | ICD-10-CM | POA: Diagnosis not present

## 2017-09-03 DIAGNOSIS — E1122 Type 2 diabetes mellitus with diabetic chronic kidney disease: Secondary | ICD-10-CM | POA: Diagnosis not present

## 2017-09-03 DIAGNOSIS — M48061 Spinal stenosis, lumbar region without neurogenic claudication: Secondary | ICD-10-CM | POA: Diagnosis not present

## 2017-09-03 DIAGNOSIS — Z7689 Persons encountering health services in other specified circumstances: Secondary | ICD-10-CM | POA: Diagnosis not present

## 2017-09-03 DIAGNOSIS — N183 Chronic kidney disease, stage 3 (moderate): Secondary | ICD-10-CM | POA: Diagnosis not present

## 2017-09-03 DIAGNOSIS — L988 Other specified disorders of the skin and subcutaneous tissue: Secondary | ICD-10-CM | POA: Diagnosis not present

## 2017-09-03 DIAGNOSIS — G834 Cauda equina syndrome: Secondary | ICD-10-CM | POA: Diagnosis not present

## 2017-09-03 NOTE — Addendum Note (Signed)
Addended by: Lianne Cure A on: 09/03/2017 04:20 PM   Modules accepted: Orders

## 2017-09-03 NOTE — Progress Notes (Signed)
Requested by:  [name], MD [address], [place], [place] [address]  Reason for consultation: bilateral plantar foot ulcers   History of Present Illness   [name] is a 51 y.o. (1966/02/18) female with multiple active co-morbidities including polysubstance abuse who presents with chief complaint: bilateral foot ulcers.  Onset of symptoms occurred in early October when she started developing blistering in both feet (see H&P dated 08/02/17).  Pt reportedly was in rehab at that time.  She was admitted.  Inpatient Wound Care recommended outpatient debridement and longitudinal wound care.  Reportedly she has been getting wound care with home health.  Atherosclerotic risk factors include: DM, HLD, HTN, .  Past Medical History:  Diagnosis Date  . Alcohol use   . Ankle fracture, lateral malleolus, closed 12/30/2011  . Anxiety   . Breast mass, left 12/15/2011  . Chronic anemia   . Chronic combined systolic and diastolic CHF (congestive heart failure) ([place])   . CKD (chronic kidney disease), stage III ([place])   . Cocaine abuse ([place])   . COPD (chronic obstructive pulmonary disease) ([place])   . Diabetes mellitus, type 2 ([place])   . Diabetic Charcot foot ([place]) 12/30/2011  . Essential hypertension   . History of GI bleed 12/05/2009   Qualifier: Diagnosis of  By: Zeb Comfort    . Hyperlipidemia   . Hypertensive cardiomyopathy ([place]) 11/08/2012   a. 05/2016: echo showing EF of 20-25% with normal cors by cath in 07/2016.  Marland Kitchen Noncompliance with medication regimen   . Obesity   . Panic attacks   . PAT (paroxysmal atrial tachycardia) ([place])    a. started on amiodarone 07/2017 for this.  . Tobacco abuse     Past Surgical History:  Procedure Laterality Date  . BREAST BIOPSY    . DILATION AND CURETTAGE OF UTERUS      Social History   Socioeconomic History  . Marital status: Widowed    Spouse name: Not on file  . Number of children: 2  . Years of education: Not on file   . Highest education level: Not on file  Social Needs  . Financial resource strain: Not on file  . Food insecurity - worry: Not on file  . Food insecurity - inability: Not on file  . Transportation needs - medical: Not on file  . Transportation needs - non-medical: Not on file  Occupational History  . Not on file  Tobacco Use  . Smoking status: Former Smoker    Packs/day: 1.00    Years: 20.00    Pack years: 20.00    Types: Cigarettes    Last attempt to quit: 06/03/2017    Years since quitting: 0.2  . Smokeless tobacco: Never Used  Substance and Sexual Activity  . Alcohol use: No    Alcohol/week: 0.0 oz    Comment: Quit in 2017  . Drug use: Yes    Frequency: 3.0 times per week    Types: Marijuana, Cocaine  . Sexual activity: Not Currently    Birth control/protection: None  Other Topics Concern  . Not on file  Social History Narrative   Currently living in a motel. Stopped taking all her medicines because she was "fed up"    Family History  Problem Relation Age of Onset  . Hypertension Mother   . Heart attack Mother   . Hypertension Father        CABG   . Hypertension Sister   . Hypertension  Brother   . Hypertension Sister   . Cancer Sister        breast   . Arthritis Unknown   . Cancer Unknown   . Diabetes Unknown   . Asthma Unknown   . Hypertension Daughter   . Hypertension Son     Current Outpatient Medications  Medication Sig Dispense Refill  . amiodarone (PACERONE) 200 MG tablet Take 1 tablet (200 mg total) by mouth 2 (two) times daily. 60 tablet 2  . aspirin EC 81 MG EC tablet Take 1 tablet (81 mg total) by mouth daily. 30 tablet 0  . Azelastine HCl (ASTEPRO) 0.15 % SOLN Place 1 spray into the nose every 12 (twelve) hours as needed (for allergies).     . carvedilol (COREG) 25 MG tablet Take 1 tablet (25 mg total) by mouth 2 (two) times daily with a meal.    . clopidogrel (PLAVIX) 75 MG tablet TAKE 1 TABLET BY MOUTH DAILY WITH BREAKFAST. 30 tablet 0  .  collagenase (SANTYL) ointment Apply 1 application topically daily. To affected area    . ezetimibe (ZETIA) 10 MG tablet Take 1 tablet (10 mg total) by mouth daily. 90 tablet 1  . fenofibrate micronized (LOFIBRA) 200 MG capsule Take 200 mg by mouth daily before breakfast.    . FLUoxetine (PROZAC) 20 MG capsule TAKE 1 CAPSULE BY MOUTH EVERY DAY. 30 capsule 0  . gabapentin (NEURONTIN) 100 MG capsule Take 200 mg by mouth 3 (three) times daily.     Marland Kitchen guaiFENesin (MUCINEX) 600 MG 12 hr tablet Take 600 mg by mouth 2 (two) times daily.    . hydrALAZINE (APRESOLINE) 25 MG tablet Take 25 mg 3 (three) times daily by mouth.    . Insulin Glargine (LANTUS SOLOSTAR) 100 UNIT/ML Solostar Pen Inject 8 Units daily at 10 pm into the skin.     Marland Kitchen ipratropium-albuterol (DUONEB) 0.5-2.5 (3) MG/3ML SOLN Take 3 mLs by nebulization every 6 (six) hours as needed.    . montelukast (SINGULAIR) 10 MG tablet TAKE (1) TABLET BY MOUTH AT BEDTIME. 30 tablet 0  . Multiple Vitamin (MULTIVITAMIN WITH MINERALS) TABS tablet Take 1 tablet by mouth daily. 30 tablet 0  . pantoprazole (PROTONIX) 40 MG tablet Take 1 tablet (40 mg total) by mouth daily. 90 tablet 1  . potassium chloride SA (K-DUR,KLOR-CON) 20 MEQ tablet TAKE 1&1/2 TABLET BY MOUTH DAILY. 45 tablet 0  . PROAIR HFA 108 (90 Base) MCG/ACT inhaler INHALE 2 PUFFS EVERY 6 HOURS AS NEEDED FOR SHORTNESS OF BREATH/WHEEZING. 8.5 g 0  . rosuvastatin (CRESTOR) 40 MG tablet Take 1 tablet (40 mg total) by mouth daily.    Marland Kitchen thiamine 100 MG tablet Take 1 tablet (100 mg total) by mouth daily. 30 tablet 0  . tiotropium (SPIRIVA HANDIHALER) 18 MCG inhalation capsule Place 18 mcg into inhaler and inhale daily. 2 puffs    . TRADJENTA 5 MG TABS tablet TAKE 1 TABLET BY MOUTH ONCE A DAY. 30 tablet 0  . traZODone (DESYREL) 50 MG tablet TAKE (1) TABLET BY MOUTH AT BEDTIME. 30 tablet 0  . Vitamin D, Ergocalciferol, (DRISDOL) 50000 units CAPS capsule Take 1 capsule (50,000 Units total) by mouth once a  week. 12 capsule 1   No current facility-administered medications for this visit.     No Known Allergies  REVIEW OF SYSTEMS (negative unless checked):   Cardiac:  [x]  Chest pain or chest pressure? [x]  Shortness of breath upon activity? [x]  Shortness of breath when lying flat? [  x] Irregular heart rhythm?  Vascular:  [x]  Pain in calf, thigh, or hip brought on by walking? [x]  Pain in feet at night that wakes you up from your sleep? []  Blood clot in your veins? [x]  Leg swelling?  Pulmonary:  []  Oxygen at home? [x]  Productive cough? [x]  Wheezing?  Neurologic:  [x]  Sudden weakness in arms or legs? [x]  Sudden numbness in arms or legs? []  Sudden onset of difficult speaking or slurred speech? []  Temporary loss of vision in one eye? []  Problems with dizziness?  Gastrointestinal:  []  Blood in stool? []  Vomited blood?  Genitourinary:  []  Burning when urinating? []  Blood in urine?  Psychiatric:  []  Major depression  Hematologic:  []  Bleeding problems? []  Problems with blood clotting?  Dermatologic:  [x]  Rashes or ulcers?  Constitutional:  []  Fever or chills?  Ear/Nose/Throat:  []  Change in hearing? []  Nose bleeds? []  Sore throat?  Musculoskeletal:  []  Back pain? []  Joint pain? []  Muscle pain?   Physical Examination     Vitals:   09/03/17 1513  BP: (!) 144/105  Pulse: 68  Resp: 20  Temp: 97.8 F (36.6 C)  TempSrc: Oral  SpO2: 100%  Weight: 138 lb (62.6 kg)  Height: 5\' 1"  (1.549 m)   Body mass index is 26.07 kg/m.  General Alert, O x 3, WD, combative  Head [place]/AT,    Ear/Nose/ Throat Hearing grossly intact, nares without erythema or drainage,   Eyes PERRL, EOMI,   Neck Supple, mid-line trachea,    Pulmonary Sym exp, good B air movt, CTA B  Cardiac RRR, Nl S1, S2, no Murmurs, No rubs, No S3,S4  Vascular Vessel Right Left  Radial Palpable Palpable  Brachial Palpable Palpable  Carotid Palpable, No Bruit Palpable, No Bruit  Aorta Not palpable  N/A  Femoral Not palpable due to position in wheelchair Not palpable due to position in wheelchair  Popliteal Palpable Palpable  PT Palpable Palpable  DP Faintly palpable Palpable    Gastro- intestinal Limited testing due patient's wishes, NABS  Musculo- skeletal Limited M/S testing due to poor cooperation, multiple ulcers in both legs, R dorsal foot ulcer appears superficial to tendons, L dorsal foot ulcer appears more superficial than R, L superior shin ulcers with black eschar, R posterior calf ulcers not evaluated per pt's wishes  Neurologic Limited due to poor cooperation  Psychiatric Hostile, uncooperative, poor judgment  Dermatologic See M/S exam for extremity exam, No rashes otherwise noted  Lymphatic  Palpable lymph nodes: None   Laboratory   CBC CBC Latest Ref Rng & Units 08/27/2017 08/16/2017 08/09/2017  WBC 4.0 - 10.5 K/uL 9.5 10.9(H) 10.6(H)  Hemoglobin 12.0 - 15.0 g/dL 10.2(L) 10.5(L) 10.0(L)  Hematocrit 36.0 - 46.0 % 32.8(L) 34.6(L) 32.3(L)  Platelets 150 - 400 K/uL 334 525(H) 427(H)    BMP BMP Latest Ref Rng & Units 08/27/2017 08/19/2017 08/16/2017  Glucose 65 - 99 mg/dL 145(H) 168(H) 48(L)  BUN 6 - 20 mg/dL 28(H) 31(H) 39(H)  Creatinine 0.44 - 1.00 mg/dL 1.84(H) 1.87(H) 2.11(H)  Sodium 135 - 145 mmol/L 139 138 137  Potassium 3.5 - 5.1 mmol/L 4.7 5.1 4.7  Chloride 101 - 111 mmol/L 106 106 101  CO2 22 - 32 mmol/L 24 24 27   Calcium 8.9 - 10.3 mg/dL 9.1 8.7(L) 9.1    Coagulation Lab Results  Component Value Date   INR 0.86 07/23/2016   INR 0.96 11/07/2012   No results found for: PTT  Lipids    Component Value Date/Time  CHOL 164 06/24/2017 2147   TRIG 87 06/24/2017 2147   HDL 51 06/24/2017 2147   CHOLHDL 3.2 06/24/2017 2147   VLDL 17 06/24/2017 2147   LDLCALC 96 06/24/2017 2147   LDLDIRECT 122 (H) 01/08/2010 0818    HgA1c >15.5   Outside Studies/Documentation   2 pages of outside documents were reviewed including: outside BLE arterial  duplex.   Medical Decision Making   [name] is a 51 y.o. female who presents with: uncontrolled diabetes resulting in chronic kidney disease stage 2-3 and likely non-salvaged foot ulcers, multiple comorbidites.    This patient was hostile throughout her exam and history.  I suspect she probably is competing with her own care as evident on today's exam.  From a vascular surgical viewpoint, she has palpable pedal pulses so no vascular intervention is needed.  Would get a second opinion from Dr. Sharol Given in regards to the salvageability of the feet.  I would not be surprised if she will need B AKA.  Thank you for allowing [name] to participate in this patient's care.   Adele Barthel, MD, FACS [facility] Office: [phone] Pager: [phone]  09/03/2017, 3:43 PM

## 2017-09-04 DIAGNOSIS — L97321 Non-pressure chronic ulcer of left ankle limited to breakdown of skin: Secondary | ICD-10-CM | POA: Diagnosis not present

## 2017-09-04 DIAGNOSIS — N183 Chronic kidney disease, stage 3 (moderate): Secondary | ICD-10-CM | POA: Diagnosis not present

## 2017-09-04 DIAGNOSIS — I5021 Acute systolic (congestive) heart failure: Secondary | ICD-10-CM | POA: Diagnosis not present

## 2017-09-04 DIAGNOSIS — E1122 Type 2 diabetes mellitus with diabetic chronic kidney disease: Secondary | ICD-10-CM | POA: Diagnosis not present

## 2017-09-04 DIAGNOSIS — E11622 Type 2 diabetes mellitus with other skin ulcer: Secondary | ICD-10-CM | POA: Diagnosis not present

## 2017-09-04 DIAGNOSIS — E1151 Type 2 diabetes mellitus with diabetic peripheral angiopathy without gangrene: Secondary | ICD-10-CM | POA: Diagnosis not present

## 2017-09-04 DIAGNOSIS — M48061 Spinal stenosis, lumbar region without neurogenic claudication: Secondary | ICD-10-CM | POA: Diagnosis not present

## 2017-09-04 DIAGNOSIS — L97211 Non-pressure chronic ulcer of right calf limited to breakdown of skin: Secondary | ICD-10-CM | POA: Diagnosis not present

## 2017-09-04 DIAGNOSIS — G834 Cauda equina syndrome: Secondary | ICD-10-CM | POA: Diagnosis not present

## 2017-09-04 DIAGNOSIS — I130 Hypertensive heart and chronic kidney disease with heart failure and stage 1 through stage 4 chronic kidney disease, or unspecified chronic kidney disease: Secondary | ICD-10-CM | POA: Diagnosis not present

## 2017-09-04 DIAGNOSIS — L97821 Non-pressure chronic ulcer of other part of left lower leg limited to breakdown of skin: Secondary | ICD-10-CM | POA: Diagnosis not present

## 2017-09-04 DIAGNOSIS — L97221 Non-pressure chronic ulcer of left calf limited to breakdown of skin: Secondary | ICD-10-CM | POA: Diagnosis not present

## 2017-09-04 DIAGNOSIS — L988 Other specified disorders of the skin and subcutaneous tissue: Secondary | ICD-10-CM | POA: Diagnosis not present

## 2017-09-06 ENCOUNTER — Ambulatory Visit (INDEPENDENT_AMBULATORY_CARE_PROVIDER_SITE_OTHER): Payer: Medicare Other | Admitting: Family Medicine

## 2017-09-06 ENCOUNTER — Other Ambulatory Visit: Payer: Self-pay | Admitting: *Deleted

## 2017-09-06 ENCOUNTER — Telehealth: Payer: Self-pay | Admitting: Family Medicine

## 2017-09-06 ENCOUNTER — Encounter: Payer: Self-pay | Admitting: Family Medicine

## 2017-09-06 VITALS — BP 140/90 | HR 70 | Resp 16 | Ht 61.0 in

## 2017-09-06 DIAGNOSIS — Z09 Encounter for follow-up examination after completed treatment for conditions other than malignant neoplasm: Secondary | ICD-10-CM

## 2017-09-06 DIAGNOSIS — E1349 Other specified diabetes mellitus with other diabetic neurological complication: Secondary | ICD-10-CM

## 2017-09-06 DIAGNOSIS — E1165 Type 2 diabetes mellitus with hyperglycemia: Secondary | ICD-10-CM

## 2017-09-06 DIAGNOSIS — E1129 Type 2 diabetes mellitus with other diabetic kidney complication: Secondary | ICD-10-CM

## 2017-09-06 DIAGNOSIS — E1152 Type 2 diabetes mellitus with diabetic peripheral angiopathy with gangrene: Secondary | ICD-10-CM | POA: Diagnosis not present

## 2017-09-06 DIAGNOSIS — Z7689 Persons encountering health services in other specified circumstances: Secondary | ICD-10-CM | POA: Diagnosis not present

## 2017-09-06 DIAGNOSIS — L97509 Non-pressure chronic ulcer of other part of unspecified foot with unspecified severity: Secondary | ICD-10-CM | POA: Diagnosis not present

## 2017-09-06 DIAGNOSIS — E11621 Type 2 diabetes mellitus with foot ulcer: Secondary | ICD-10-CM | POA: Diagnosis not present

## 2017-09-06 DIAGNOSIS — IMO0002 Reserved for concepts with insufficient information to code with codable children: Secondary | ICD-10-CM

## 2017-09-06 DIAGNOSIS — I10 Essential (primary) hypertension: Secondary | ICD-10-CM | POA: Diagnosis not present

## 2017-09-06 MED ORDER — TRAMADOL HCL 50 MG PO TABS: ORAL_TABLET | ORAL | 2 refills | Status: DC

## 2017-09-06 NOTE — Telephone Encounter (Signed)
See previous message

## 2017-09-06 NOTE — Telephone Encounter (Signed)
Called back and will request orders for 3x a week wound care per nurse. Pt being seen in office this am

## 2017-09-06 NOTE — Telephone Encounter (Signed)
Anderson Malta, [facility], left message on nurse line regarding patient. She is requesting call back regarding patient's lab draw last Friday.   Callback# [phone]

## 2017-09-06 NOTE — Patient Outreach (Signed)
[facility]) Care Management  09/06/2017  KORRA CHRISTINE [birth year] [id number]  Care Coordination and collaboratoin re: Ms. Mianna Iezzi.   Level of Care Concerns -Ms. Makayela Secrest was referred to [facility] for nursing services after her recent discharge from [facility] on 08/26/17. She is also currently being followed by our social work team. Ms Tacker originally stated to [employer] LCSW [facility] CM) that she wished to move to an apartment complex. Ms. Aline Brochure was helping Ms Perren in those efforts. However, Ms. Sachdev told Jory Sims DNP and yesterday Dr. Tula Nakayama she wishes to be placed at [place] [place] facility long term.  I asked the office LPN, Kate Sable and Dr. Moshe Cipro for assistance with updating FL-2 and sending it via fax to [facility]. Ms. Picariello is currently staying in an apartment in [place] ([address]) with her son.   Skin Care Needs - Ms. Kittrell home health nurse is coming three times weekly to perform dressing changes.    Medication Management Needs - Ms. Soltys home health nurse reports reviewing medications with Ms Gockel and ensuring that she has all prescribed medications on hand.   Plan: I will collaborate with Raynaldo Opitz LCSW [facility]) re: Ms. Leamer level of care needs. I will follow up with Ms. Bergren over the next week re: ongoing dressings/skin care and medication management needs.   [facility] CM Care Plan Problem One     Most Recent Value  Care Plan Problem One  Level of Care Concerns with Care Coordination Needs and Associated Knowledge Deficits   Role Documenting the Problem One  Care Management Coordinator  Care Plan for Problem One  Active  THN Long Term Goal   Over the next 31 days, patient will work with LCSW and PCP office to explore long term care options  THN Long Term Goal Start Date  08/31/17  Interventions for Problem One Long Term Goal   collaboration with PCP office nurse and LCSW re: patient care needs,  RNCM requested updated FL-2 to be faxed to patient's preferred SNF facility  [facility] CM Short Term Goal #1   Over the next 30 days, patient will speak with LCSW by phone or in person to discuss options for long term care needs  [facility] CM Short Term Goal #1 Start Date  08/31/17  Interventions for Short Term Goal #1  confirmed that patient is scheduled to talk with LCSW by phone    [facility] CM Care Plan Problem Two     Most Recent Value  Care Plan Problem Two  Medication Management Adherence Deficits and Knowledge Deficits as evidenced by patient's voiced concerns and questions and query with pharmacy about fill dates  Role Documenting the Problem Two  Care Management [place] for Problem Two  Active  Interventions for Problem Two Long Term Goal   collaboration with [facility] re: medication management needs  THN Long Term Goal  Over the next 60 days, patient will verbalize understanding of medication regimen and will demonstrate adherence 75% of the time as evidenced by pill count and nursing observation  [place] Goal Start Date  08/31/17  THN CM Short Term Goal #1   Over the next 30 days, patient will work with [facility] 2x/weekly to address medication knowledge deficits  THN CM Short Term Goal #1 Start Date  08/31/17  Interventions for Short Term Goal #2   confirmed with home health agency that patient  is being seen by nurse now three times/weekly  THN CM Short Term Goal #2   Over the next 7 days, patient will confirm receipt of all prescribed medications and verbalize improvement in self administration/adherence  THN CM Short Term Goal #2 Start Date  08/31/17  Interventions for Short Term Goal #2  Utilizing teachback method, discussed with patient the importance of self activation and self advocacy regarding medication administration and self health management  THN CM Short Term Goal #3   Over the next 30 days, patient will call  pharmacy 7 days prior to end of any medication supply to request refill  THN CM Short Term Goal #3 Start Date  08/31/17  Interventions for Short Term Goal #3  to follow up next telephone visit    [place] Problem Three     Most Recent Value  Care Plan Problem Three  Knowledge Deficits related to Self Health management of lower extremity cellulitis  Role Documenting the Problem Three  Care Management [place] for Problem Three  Active  THN Long Term Goal   Over the next 60 days, patient will demonstrate understanding of plan of care for long term self health management of intermittent/chronic lower extremity cellulitis as evidenced by patient verbalization of daily care protocol, signs and symptoms warranting a call to the doctor, and how chronic disease management affects cellulitis management outcomes  THN Long Term Goal Start Date  08/31/17  Interventions for Problem Three Long Term Goal  confirmed receipt by [facility] agency of increased [place] needs related to education, care, and management of LE cellulitis and wound care  THN CM Short Term Goal #1   Over the next 30 days, patient will work with [facility] to establish firm understanding of daily care guidelines for self health mangaement of bilateral lower extremity cellulitis  THN CM Short Term Goal #1 Start Date  08/31/17  Interventions for Short Term Goal #1  confimred patient/HHRN collaboration/visits  THN CM Short Term Goal #2   Over the next 30 days, patient will verablize understanding of medications or treatments used for managemnet of bilateral lower extremity cellutisi  THN CM Short Term Goal #2 Start Date  08/31/17  Interventions for Short Term Goal #2  Discussed medication management related to treatment of cellultits  THN CM Short Term Goal #3   Over the next 30 days, patient will verbalize understanding of signs and symptoms of worsening cellulitis which warrant a call to provider  [facility]  CM Short Term Goal #3 Start Date   08/31/17  Interventions for Short Term Goal #3  Utllizing teachback method, reviewed signs adn symptoms of worsening cellulitis which warrant a call to provider: emmi educational materials prescribed      [place] Care Management  [phone]

## 2017-09-06 NOTE — Telephone Encounter (Signed)
Patient was seen this am for office visit

## 2017-09-06 NOTE — Patient Instructions (Addendum)
F/u in 3 months, call if you need me sooner  I will authorize weekly wound care visits for an additional 6 weeks  New is trmadol 50 mg one at bedtime for pain, continue gabapentin prescribed  I will be working with Education officer, museum to attempt to facilitate placement to another facility, as you are incapable of caring for yourself at home  HBA1C , lipid and hepatic panel past due and needs to be done

## 2017-09-07 ENCOUNTER — Other Ambulatory Visit: Payer: Self-pay | Admitting: *Deleted

## 2017-09-07 ENCOUNTER — Telehealth: Payer: Self-pay | Admitting: Family Medicine

## 2017-09-07 ENCOUNTER — Other Ambulatory Visit: Payer: Self-pay

## 2017-09-07 ENCOUNTER — Ambulatory Visit: Payer: Self-pay | Admitting: *Deleted

## 2017-09-07 DIAGNOSIS — E1151 Type 2 diabetes mellitus with diabetic peripheral angiopathy without gangrene: Secondary | ICD-10-CM | POA: Diagnosis not present

## 2017-09-07 DIAGNOSIS — N183 Chronic kidney disease, stage 3 (moderate): Secondary | ICD-10-CM | POA: Diagnosis not present

## 2017-09-07 DIAGNOSIS — L97211 Non-pressure chronic ulcer of right calf limited to breakdown of skin: Secondary | ICD-10-CM | POA: Diagnosis not present

## 2017-09-07 DIAGNOSIS — L97321 Non-pressure chronic ulcer of left ankle limited to breakdown of skin: Secondary | ICD-10-CM | POA: Diagnosis not present

## 2017-09-07 DIAGNOSIS — E1122 Type 2 diabetes mellitus with diabetic chronic kidney disease: Secondary | ICD-10-CM | POA: Diagnosis not present

## 2017-09-07 DIAGNOSIS — G834 Cauda equina syndrome: Secondary | ICD-10-CM | POA: Diagnosis not present

## 2017-09-07 DIAGNOSIS — I5021 Acute systolic (congestive) heart failure: Secondary | ICD-10-CM | POA: Diagnosis not present

## 2017-09-07 DIAGNOSIS — L97221 Non-pressure chronic ulcer of left calf limited to breakdown of skin: Secondary | ICD-10-CM | POA: Diagnosis not present

## 2017-09-07 DIAGNOSIS — L97821 Non-pressure chronic ulcer of other part of left lower leg limited to breakdown of skin: Secondary | ICD-10-CM | POA: Diagnosis not present

## 2017-09-07 DIAGNOSIS — M48061 Spinal stenosis, lumbar region without neurogenic claudication: Secondary | ICD-10-CM | POA: Diagnosis not present

## 2017-09-07 DIAGNOSIS — E11622 Type 2 diabetes mellitus with other skin ulcer: Secondary | ICD-10-CM | POA: Diagnosis not present

## 2017-09-07 DIAGNOSIS — L988 Other specified disorders of the skin and subcutaneous tissue: Secondary | ICD-10-CM | POA: Diagnosis not present

## 2017-09-07 DIAGNOSIS — I130 Hypertensive heart and chronic kidney disease with heart failure and stage 1 through stage 4 chronic kidney disease, or unspecified chronic kidney disease: Secondary | ICD-10-CM | POA: Diagnosis not present

## 2017-09-07 MED ORDER — ACCU-CHEK AVIVA PLUS W/DEVICE KIT: PACK | 0 refills | Status: DC

## 2017-09-07 MED ORDER — GLUCOSE BLOOD VI STRP: ORAL_STRIP | 5 refills | Status: DC

## 2017-09-07 MED ORDER — ACCU-CHEK FASTCLIX LANCETS MISC
5 refills | Status: DC
Start: 2017-09-07 — End: 2018-06-07

## 2017-09-07 NOTE — Telephone Encounter (Signed)
Sent in

## 2017-09-07 NOTE — Patient Outreach (Signed)
[facility]) Care Management  09/07/2017  [name] [birth year] [id number]   CSW had received message from [place] that patient was seen by Dr. Moshe Cipro yesterday & is requesting assistance with getting patient back into a SNF. Patient's current living conditions are, as always extremely unhealthy and she is a diabetic with open ulcers on her feet  Patient stated to Dr. Moshe Cipro that she is re applying for medicaid. CSW called & spoke with [name] with [facility]) to confirm that they have beds available but was concerned that patient did not have any UHC days left after her stay at [facility]. CSW attempted to reach patient at [phone] & [phone], but no answer & no option to leave a voicemail. CSW called & spoke with [name], patient's daughter whom she has listed on her [facility] consent form [phone] who states that she is unsure whether Medicaid application has been submitted but agrees that patient's current living conditions with her brother/patient's son, [name] is not the best. Patient was receiving CAP services prior to hospitalization, she had a CAP worker going out daily 7 hours a day during the week & 3 hours on the weekend.  Update: CSW received call back from patient [phone], patient's Medicaid is set to run out at the end of the month. Patient states that her sister, [name] & brother, [name] is working on getting patient's paperwork together to submit to DSS to get Medicaid. Patient informed CSW that she has not been receiving CAP services since she's returned home since her Medicaid has/is set to run out.   Patient's sister, [name] expressed concern about patient's wounds on her feet and states that she has an appointment that she has scheduled through [place] [facility] for this Friday, 11/16 at 12:30 with Dr. Christin Fudge but was unable to schedule transportation  through [facility] as they require a 5 day notice for out-of-county transportations. CSW called [facility] through patient's insurance [facility]) at [phone] and made arrangements for patient to be picked up Friday between 11-11:30 to be taken to the appointment (reservation #: [id number]). Patient & her sister, [name] made aware [name] to meet patient at appointment.   CSW encouraged patient to call CSW should she encounter any issues with Medicaid and to submit as quickly as possible. Patient & her sister were very appreciative of CSW support and guidance. CSW will check back with patient in 2 weeks.    Barbara Opitz, LCSW [facility]  Clinical Social Worker cell #: [phone]

## 2017-09-07 NOTE — Telephone Encounter (Signed)
Pt seen in office

## 2017-09-07 NOTE — Telephone Encounter (Signed)
Patient left message on nurse line requesting glucometer and supplies. [facility] for delivery.

## 2017-09-08 DIAGNOSIS — I5021 Acute systolic (congestive) heart failure: Secondary | ICD-10-CM | POA: Diagnosis not present

## 2017-09-08 DIAGNOSIS — L97221 Non-pressure chronic ulcer of left calf limited to breakdown of skin: Secondary | ICD-10-CM | POA: Diagnosis not present

## 2017-09-08 DIAGNOSIS — E11622 Type 2 diabetes mellitus with other skin ulcer: Secondary | ICD-10-CM | POA: Diagnosis not present

## 2017-09-08 DIAGNOSIS — E1151 Type 2 diabetes mellitus with diabetic peripheral angiopathy without gangrene: Secondary | ICD-10-CM | POA: Diagnosis not present

## 2017-09-08 DIAGNOSIS — E1122 Type 2 diabetes mellitus with diabetic chronic kidney disease: Secondary | ICD-10-CM | POA: Diagnosis not present

## 2017-09-08 DIAGNOSIS — I130 Hypertensive heart and chronic kidney disease with heart failure and stage 1 through stage 4 chronic kidney disease, or unspecified chronic kidney disease: Secondary | ICD-10-CM | POA: Diagnosis not present

## 2017-09-08 DIAGNOSIS — N183 Chronic kidney disease, stage 3 (moderate): Secondary | ICD-10-CM | POA: Diagnosis not present

## 2017-09-08 DIAGNOSIS — G834 Cauda equina syndrome: Secondary | ICD-10-CM | POA: Diagnosis not present

## 2017-09-08 DIAGNOSIS — L97211 Non-pressure chronic ulcer of right calf limited to breakdown of skin: Secondary | ICD-10-CM | POA: Diagnosis not present

## 2017-09-08 DIAGNOSIS — L988 Other specified disorders of the skin and subcutaneous tissue: Secondary | ICD-10-CM | POA: Diagnosis not present

## 2017-09-08 DIAGNOSIS — M48061 Spinal stenosis, lumbar region without neurogenic claudication: Secondary | ICD-10-CM | POA: Diagnosis not present

## 2017-09-08 DIAGNOSIS — L97821 Non-pressure chronic ulcer of other part of left lower leg limited to breakdown of skin: Secondary | ICD-10-CM | POA: Diagnosis not present

## 2017-09-08 DIAGNOSIS — L97321 Non-pressure chronic ulcer of left ankle limited to breakdown of skin: Secondary | ICD-10-CM | POA: Diagnosis not present

## 2017-09-09 ENCOUNTER — Telehealth: Payer: Self-pay | Admitting: *Deleted

## 2017-09-09 ENCOUNTER — Telehealth: Payer: Self-pay

## 2017-09-09 DIAGNOSIS — I130 Hypertensive heart and chronic kidney disease with heart failure and stage 1 through stage 4 chronic kidney disease, or unspecified chronic kidney disease: Secondary | ICD-10-CM | POA: Diagnosis not present

## 2017-09-09 DIAGNOSIS — L97821 Non-pressure chronic ulcer of other part of left lower leg limited to breakdown of skin: Secondary | ICD-10-CM | POA: Diagnosis not present

## 2017-09-09 DIAGNOSIS — L988 Other specified disorders of the skin and subcutaneous tissue: Secondary | ICD-10-CM | POA: Diagnosis not present

## 2017-09-09 DIAGNOSIS — I5021 Acute systolic (congestive) heart failure: Secondary | ICD-10-CM | POA: Diagnosis not present

## 2017-09-09 DIAGNOSIS — L97221 Non-pressure chronic ulcer of left calf limited to breakdown of skin: Secondary | ICD-10-CM | POA: Diagnosis not present

## 2017-09-09 DIAGNOSIS — M48061 Spinal stenosis, lumbar region without neurogenic claudication: Secondary | ICD-10-CM | POA: Diagnosis not present

## 2017-09-09 DIAGNOSIS — E1151 Type 2 diabetes mellitus with diabetic peripheral angiopathy without gangrene: Secondary | ICD-10-CM | POA: Diagnosis not present

## 2017-09-09 DIAGNOSIS — L97211 Non-pressure chronic ulcer of right calf limited to breakdown of skin: Secondary | ICD-10-CM | POA: Diagnosis not present

## 2017-09-09 DIAGNOSIS — E11622 Type 2 diabetes mellitus with other skin ulcer: Secondary | ICD-10-CM | POA: Diagnosis not present

## 2017-09-09 DIAGNOSIS — G834 Cauda equina syndrome: Secondary | ICD-10-CM | POA: Diagnosis not present

## 2017-09-09 DIAGNOSIS — L97321 Non-pressure chronic ulcer of left ankle limited to breakdown of skin: Secondary | ICD-10-CM | POA: Diagnosis not present

## 2017-09-09 DIAGNOSIS — N183 Chronic kidney disease, stage 3 (moderate): Secondary | ICD-10-CM | POA: Diagnosis not present

## 2017-09-09 DIAGNOSIS — E1122 Type 2 diabetes mellitus with diabetic chronic kidney disease: Secondary | ICD-10-CM | POA: Diagnosis not present

## 2017-09-09 NOTE — Telephone Encounter (Signed)
I received documentation that event monitor had not transmitted in 48 hrs.Patient states she doesn't know how to put on new leads.I told her if she comes into the office , I will show her how to place electrodes.Patient states she will come to office for help

## 2017-09-09 NOTE — Telephone Encounter (Signed)
[name] states they may only need the fl2 sent to them. They already have the [place] at [facility] so will try to get it sent to them

## 2017-09-09 NOTE — Telephone Encounter (Signed)
[name] called stating Dr Moshe Cipro needs to send something to [facility] for patient to have in facility care, the email that it needs to go to is [email or web address]. Per Junious Dresser. Junious Dresser states patient really needs in facility care.    Please call Junious Dresser at [phone].

## 2017-09-09 NOTE — Telephone Encounter (Signed)
Will check with caseworker and await response from her before sending to dr to see if this is needed

## 2017-09-10 ENCOUNTER — Encounter: Payer: Medicare Other | Attending: Surgery | Admitting: Surgery

## 2017-09-10 DIAGNOSIS — L97211 Non-pressure chronic ulcer of right calf limited to breakdown of skin: Secondary | ICD-10-CM | POA: Diagnosis not present

## 2017-09-10 DIAGNOSIS — L97523 Non-pressure chronic ulcer of other part of left foot with necrosis of muscle: Secondary | ICD-10-CM | POA: Diagnosis not present

## 2017-09-10 DIAGNOSIS — I5021 Acute systolic (congestive) heart failure: Secondary | ICD-10-CM | POA: Diagnosis not present

## 2017-09-10 DIAGNOSIS — I5042 Chronic combined systolic (congestive) and diastolic (congestive) heart failure: Secondary | ICD-10-CM | POA: Diagnosis not present

## 2017-09-10 DIAGNOSIS — E1122 Type 2 diabetes mellitus with diabetic chronic kidney disease: Secondary | ICD-10-CM | POA: Insufficient documentation

## 2017-09-10 DIAGNOSIS — M069 Rheumatoid arthritis, unspecified: Secondary | ICD-10-CM | POA: Diagnosis not present

## 2017-09-10 DIAGNOSIS — N183 Chronic kidney disease, stage 3 (moderate): Secondary | ICD-10-CM | POA: Insufficient documentation

## 2017-09-10 DIAGNOSIS — L97222 Non-pressure chronic ulcer of left calf with fat layer exposed: Secondary | ICD-10-CM | POA: Diagnosis not present

## 2017-09-10 DIAGNOSIS — F419 Anxiety disorder, unspecified: Secondary | ICD-10-CM | POA: Diagnosis not present

## 2017-09-10 DIAGNOSIS — L97821 Non-pressure chronic ulcer of other part of left lower leg limited to breakdown of skin: Secondary | ICD-10-CM | POA: Diagnosis not present

## 2017-09-10 DIAGNOSIS — I130 Hypertensive heart and chronic kidney disease with heart failure and stage 1 through stage 4 chronic kidney disease, or unspecified chronic kidney disease: Secondary | ICD-10-CM | POA: Diagnosis not present

## 2017-09-10 DIAGNOSIS — I252 Old myocardial infarction: Secondary | ICD-10-CM | POA: Insufficient documentation

## 2017-09-10 DIAGNOSIS — E11622 Type 2 diabetes mellitus with other skin ulcer: Secondary | ICD-10-CM | POA: Diagnosis not present

## 2017-09-10 DIAGNOSIS — L97819 Non-pressure chronic ulcer of other part of right lower leg with unspecified severity: Secondary | ICD-10-CM | POA: Diagnosis not present

## 2017-09-10 DIAGNOSIS — F17218 Nicotine dependence, cigarettes, with other nicotine-induced disorders: Secondary | ICD-10-CM | POA: Insufficient documentation

## 2017-09-10 DIAGNOSIS — L97212 Non-pressure chronic ulcer of right calf with fat layer exposed: Secondary | ICD-10-CM | POA: Insufficient documentation

## 2017-09-10 DIAGNOSIS — M48061 Spinal stenosis, lumbar region without neurogenic claudication: Secondary | ICD-10-CM | POA: Diagnosis not present

## 2017-09-10 DIAGNOSIS — G4730 Sleep apnea, unspecified: Secondary | ICD-10-CM | POA: Diagnosis not present

## 2017-09-10 DIAGNOSIS — E11621 Type 2 diabetes mellitus with foot ulcer: Secondary | ICD-10-CM | POA: Diagnosis not present

## 2017-09-10 DIAGNOSIS — Z794 Long term (current) use of insulin: Secondary | ICD-10-CM | POA: Diagnosis not present

## 2017-09-10 DIAGNOSIS — L97513 Non-pressure chronic ulcer of other part of right foot with necrosis of muscle: Secondary | ICD-10-CM | POA: Diagnosis not present

## 2017-09-10 DIAGNOSIS — L988 Other specified disorders of the skin and subcutaneous tissue: Secondary | ICD-10-CM | POA: Diagnosis not present

## 2017-09-10 DIAGNOSIS — E785 Hyperlipidemia, unspecified: Secondary | ICD-10-CM | POA: Diagnosis not present

## 2017-09-10 DIAGNOSIS — I43 Cardiomyopathy in diseases classified elsewhere: Secondary | ICD-10-CM | POA: Diagnosis not present

## 2017-09-10 DIAGNOSIS — J449 Chronic obstructive pulmonary disease, unspecified: Secondary | ICD-10-CM | POA: Insufficient documentation

## 2017-09-10 DIAGNOSIS — F1414 Cocaine abuse with cocaine-induced mood disorder: Secondary | ICD-10-CM | POA: Diagnosis not present

## 2017-09-10 DIAGNOSIS — L97822 Non-pressure chronic ulcer of other part of left lower leg with fat layer exposed: Secondary | ICD-10-CM | POA: Diagnosis not present

## 2017-09-10 DIAGNOSIS — E1151 Type 2 diabetes mellitus with diabetic peripheral angiopathy without gangrene: Secondary | ICD-10-CM | POA: Diagnosis not present

## 2017-09-10 DIAGNOSIS — I2510 Atherosclerotic heart disease of native coronary artery without angina pectoris: Secondary | ICD-10-CM | POA: Diagnosis not present

## 2017-09-10 DIAGNOSIS — L97221 Non-pressure chronic ulcer of left calf limited to breakdown of skin: Secondary | ICD-10-CM | POA: Diagnosis not present

## 2017-09-10 DIAGNOSIS — G834 Cauda equina syndrome: Secondary | ICD-10-CM | POA: Diagnosis not present

## 2017-09-10 DIAGNOSIS — L97321 Non-pressure chronic ulcer of left ankle limited to breakdown of skin: Secondary | ICD-10-CM | POA: Diagnosis not present

## 2017-09-11 NOTE — Progress Notes (Signed)
[name], [name] ([id number]) Visit Report for 09/10/2017 Abuse/Suicide Risk Screen Details Patient Name: [name] Date of Service: 09/10/2017 12:30 PM Medical Record Number: [id number] Patient Account Number: [id number] Date of Birth/Sex: 08/17/1966 (51 y.o. Female) Treating RN: Roger Shelter Primary Care Jabron Weese: Tula Nakayama Other Clinician: Referring Jaryiah Mehlman: Tula Nakayama Treating Yamili Lichtenwalner/Extender: Frann Rider in Treatment: 0 Abuse/Suicide Risk Screen Items Answer ABUSE/SUICIDE RISK SCREEN: Has anyone close to you tried to hurt or harm you recentlyo No Do you feel uncomfortable with anyone in your familyo No Has anyone forced you do things that you didnot want to doo No Do you have any thoughts of harming yourselfo No Patient displays signs or symptoms of abuse and/or neglect. No Electronic Signature(s) Signed: 09/10/2017 3:24:39 PM By: Roger Shelter Entered By: Roger Shelter on 09/10/2017 13:02:55 [name] ([id number]) -------------------------------------------------------------------------------- Activities of Daily Living Details Patient Name: [name] Date of Service: 09/10/2017 12:30 PM Medical Record Number: [id number] Patient Account Number: [id number] Date of Birth/Sex: 09/18/1966 (51 y.o. Female) Treating RN: Roger Shelter Primary Care Aramis Weil: Tula Nakayama Other Clinician: Referring Kely Dohn: Tula Nakayama Treating Kadijah Shamoon/Extender: Frann Rider in Treatment: 0 Activities of Daily Living Items Answer Activities of Daily Living (Please select one for each item) Drive Automobile Need Assistance Take Medications Need Assistance Use Telephone Completely Able Care for Appearance Need Assistance Use Toilet Need Assistance Bath / Shower Need Assistance Dress Self Need Assistance Feed Self Completely Able Walk Need Assistance Get In / Out Bed Need Assistance Housework Need  Assistance Prepare Meals Need Assistance Handle Money Need Assistance Shop for Self Need Assistance Electronic Signature(s) Signed: 09/10/2017 3:24:39 PM By: Roger Shelter Entered By: Roger Shelter on 09/10/2017 13:03:56 [name] ([id number]) -------------------------------------------------------------------------------- Education Assessment Details Patient Name: [name] Date of Service: 09/10/2017 12:30 PM Medical Record Number: [id number] Patient Account Number: [id number] Date of Birth/Sex: 1966/09/20 (51 y.o. Female) Treating RN: Roger Shelter Primary Care Vearl Allbaugh: Tula Nakayama Other Clinician: Referring Janiah Devinney: Tula Nakayama Treating Kenadie Royce/Extender: Frann Rider in Treatment: 0 Learning Preferences/Education Level/Primary Language Highest Education Level: Grade School Preferred Language: English Cognitive Barrier Assessment/Beliefs Language Barrier: No Translator Needed: No Memory Deficit: No Emotional Barrier: No Cultural/Religious Beliefs Affecting Medical Care: No Physical Barrier Assessment Impaired Vision: Yes Glasses Impaired Hearing: No Decreased Hand dexterity: No Knowledge/Comprehension Assessment Knowledge Level: Medium Comprehension Level: Medium Ability to understand written Medium instructions: Ability to understand verbal Medium instructions: Motivation Assessment Anxiety Level: Anxious Cooperation: Cooperative Education Importance: Acknowledges Need Interest in Health Problems: Asks Questions Perception: Coherent Willingness to Engage in Self- High Management Activities: Readiness to Engage in Self- High Management Activities: Electronic Signature(s) Signed: 09/10/2017 3:24:39 PM By: Roger Shelter Entered By: Roger Shelter on 09/10/2017 13:04:54 [name] ([id number]) -------------------------------------------------------------------------------- Fall Risk Assessment  Details Patient Name: [name] Date of Service: 09/10/2017 12:30 PM Medical Record Number: [id number] Patient Account Number: [id number] Date of Birth/Sex: 1966-08-10 (51 y.o. Female) Treating RN: Roger Shelter Primary Care Charlynn Salih: Tula Nakayama Other Clinician: Referring Mame Twombly: Tula Nakayama Treating Kentaro Alewine/Extender: Frann Rider in Treatment: 0 Fall Risk Assessment Items Have you had 2 or more falls in the last 12 monthso 0 Yes Have you had any fall that resulted in injury in the last 12 monthso 0 Yes FALL RISK ASSESSMENT: History of falling - immediate or within 3 months 0 No Secondary diagnosis 0 No Ambulatory aid None/bed rest/wheelchair/nurse 0 Yes Crutches/cane/walker 0 No Furniture 0 No IV Access/Saline Lock 0 No Gait/Training Normal/bed rest/immobile 0 Yes  Weak 10 Yes Impaired 20 Yes Mental Status Oriented to own ability 0 Yes Electronic Signature(s) Signed: 09/10/2017 3:24:39 PM By: Roger Shelter Entered By: Roger Shelter on 09/10/2017 13:06:12 [name] ([id number]) -------------------------------------------------------------------------------- Foot Assessment Details Patient Name: [name] Date of Service: 09/10/2017 12:30 PM Medical Record Number: [id number] Patient Account Number: [id number] Date of Birth/Sex: 1966/07/15 (51 y.o. Female) Treating RN: Roger Shelter Primary Care Robley Matassa: Tula Nakayama Other Clinician: Referring Lyndon Chenoweth: Tula Nakayama Treating Levy Cedano/Extender: Frann Rider in Treatment: 0 Foot Assessment Items Site Locations + = Sensation present, - = Sensation absent, C = Callus, U = Ulcer R = Redness, W = Warmth, M = Maceration, PU = Pre-ulcerative lesion F = Fissure, S = Swelling, [name] = Dryness Assessment Right: Left: Other Deformity: No No Prior Foot Ulcer: Yes No Prior Amputation: No No Charcot Joint: Yes Yes Ambulatory Status: Ambulatory With  Help Assistance Device: Wheelchair Gait: Buyer, retail Signature(s) Signed: 09/10/2017 3:24:39 PM By: Roger Shelter Entered By: Roger Shelter on 09/10/2017 13:11:07 [name] ([id number]) -------------------------------------------------------------------------------- Nutrition Risk Assessment Details Patient Name: [name] Date of Service: 09/10/2017 12:30 PM Medical Record Number: [id number] Patient Account Number: [id number] Date of Birth/Sex: Mar 19, 1966 (51 y.o. Female) Treating RN: Roger Shelter Primary Care Shyler Holzman: Tula Nakayama Other Clinician: Referring Maysie Parkhill: Tula Nakayama Treating Gable Odonohue/Extender: Frann Rider in Treatment: 0 Height (in): 61 Weight (lbs): 130 Body Mass Index (BMI): 24.6 Nutrition Risk Assessment Items NUTRITION RISK SCREEN: I have an illness or condition that made me change the kind and/or amount of 0 No food I eat I eat fewer than two meals per day 0 No I eat few fruits and vegetables, or milk products 0 No I have three or more drinks of beer, liquor or wine almost every day 0 No I have tooth or mouth problems that make it hard for me to eat 0 No I don't always have enough money to buy the food I need 0 No I eat alone most of the time 0 No I take three or more different prescribed or over-the-counter drugs a day 0 No Without wanting to, I have lost or gained 10 pounds in the last six months 0 No I am not always physically able to shop, cook and/or feed myself 0 No Nutrition Protocols Good Risk Protocol 0 No interventions needed Moderate Risk Protocol Electronic Signature(s) Signed: 09/10/2017 3:24:39 PM By: Roger Shelter Entered By: Roger Shelter on 09/10/2017 13:07:09

## 2017-09-11 NOTE — Progress Notes (Addendum)
LOWEN, MANSOURI ([id number]) Visit Report for 09/10/2017 Chief Complaint Document Details Patient Name: [name], [name] Date of Service: 09/10/2017 12:30 PM Medical Record Number: [id number] Patient Account Number: [id number] Date of Birth/Sex: Jun 16, 1966 (50 y.o. Female) Treating RN: Cornell Barman Primary Care Provider: Tula Nakayama Other Clinician: Referring Provider: Tula Nakayama Treating Provider/Extender: Frann Rider in Treatment: 0 Information Obtained from: Patient Chief Complaint Patients presents for treatment of an open diabetic ulcer to bilateral lower extremities on the cars and the dorsum of both feet for at least 3 months Electronic Signature(s) Signed: 09/10/2017 1:50:49 PM By: Christin Fudge MD, FACS Entered By: Christin Fudge on 09/10/2017 13:50:49 [name] ([id number]) -------------------------------------------------------------------------------- HPI Details Patient Name: [name] Date of Service: 09/10/2017 12:30 PM Medical Record Number: [id number] Patient Account Number: [id number] Date of Birth/Sex: 1966/09/20 (51 y.o. Female) Treating RN: Cornell Barman Primary Care Provider: Tula Nakayama Other Clinician: Referring Provider: Tula Nakayama Treating Provider/Extender: Frann Rider in Treatment: 0 History of Present Illness Location: Bilateral feet and both lower extremities, multiple ulcerations Quality: Patient reports experiencing a sharp pain to affected area(s). Severity: Patient states wound are getting worse. Duration: Patient has had the wound for > 3 months prior to seeking treatment at the wound center Timing: Pain in wound is constant (hurts all the time) Context: The wound would happen gradually Modifying Factors: Other treatment(s) tried include:inpatient treatment and is awaiting an orthopedic appointment with Dr. Sharol Given Associated Signs and Symptoms: Patient reports having foul odor. HPI  Description: 51 year old patient was recently seen by the vascular surgeon Dr. Bridgett Larsson, for bilateral foot ulcers. She has multiple health issues including polysubstance abuse and was noted to develop blisters about a month ago. She is known to have diabetes mellitus, hyperlipidemia, hypertension, alcohol abuse, chronic combined systolic and diastolic CHF, chronic kidney disease stage III, cocaine abuse,diabetic Charcot foot, hypertensive cardiomyopathy with ejection fraction being 20- 25% and tobacco abuse. During her recent hospitalization she was put on Santyl ointment and was asked to follow-up with the wound care. She was treated with clindamycin for cellulitis. On Dr. Lianne Moris office visit on 09/03/2017 he recommended no vascular intervention at the present time. Her most recent hemoglobin A1c was more than 15.5% Electronic Signature(s) Signed: 09/10/2017 1:50:55 PM By: Christin Fudge MD, FACS Previous Signature: 09/10/2017 1:47:52 PM Version By: Christin Fudge MD, FACS Previous Signature: 09/10/2017 1:27:09 PM Version By: Christin Fudge MD, FACS Previous Signature: 09/10/2017 12:49:16 PM Version By: Christin Fudge MD, FACS Previous Signature: 09/10/2017 12:43:43 PM Version By: Christin Fudge MD, FACS Entered By: Christin Fudge on 09/10/2017 13:50:55 [name] ([id number]) -------------------------------------------------------------------------------- Physical Exam Details Patient Name: [name] Date of Service: 09/10/2017 12:30 PM Medical Record Number: [id number] Patient Account Number: [id number] Date of Birth/Sex: 1966/04/02 (51 y.o. Female) Treating RN: Cornell Barman Primary Care Provider: Tula Nakayama Other Clinician: Referring Provider: Tula Nakayama Treating Provider/Extender: Frann Rider in Treatment: 0 Constitutional . Pulse regular. Respirations normal and unlabored. Afebrile. . Eyes Nonicteric. Reactive to light. Ears, Nose, Mouth, and  Throat Lips, teeth, and gums WNL.Marland Kitchen Moist mucosa without lesions. Neck supple and nontender. No palpable supraclavicular or cervical adenopathy. Normal sized without goiter. Respiratory WNL. No retractions.. Cardiovascular Pedal Pulses WNL. recent vascular consult noted. No clubbing, cyanosis or edema. Gastrointestinal (GI) Abdomen without masses or tenderness.. No liver or spleen enlargement or tenderness.. Lymphatic No adneopathy. No adenopathy. No adenopathy. Musculoskeletal Adexa without tenderness or enlargement.. Digits and nails w/o clubbing, cyanosis, infection, petechiae, ischemia, or inflammatory conditions.Marland Kitchen  Integumentary (Hair, Skin) No suspicious lesions. No crepitus or fluctuance. No peri-wound warmth or erythema. No masses.Marland Kitchen Psychiatric Judgement and insight Intact.. No evidence of depression, anxiety, or agitation.. Notes multiple necrotic large wounds both lower extremities on the carbs anterior posterior and on the dorsum of both feet with Charcot deformity of the feet. Extremely tender and no options to debride in the wound center Electronic Signature(s) Signed: 09/10/2017 1:52:14 PM By: Christin Fudge MD, FACS Entered By: Christin Fudge on 09/10/2017 13:52:14 [name] ([id number]) -------------------------------------------------------------------------------- Physician Orders Details Patient Name: [name] Date of Service: 09/10/2017 12:30 PM Medical Record Number: [id number] Patient Account Number: [id number] Date of Birth/Sex: [birth year] (51 y.o. Female) Treating RN: Roger Shelter Primary Care Provider: Tula Nakayama Other Clinician: Referring Provider: Tula Nakayama Treating Provider/Extender: Frann Rider in Treatment: 0 Verbal / Phone Orders: No Diagnosis Coding ICD-10 Coding Code Description E11.621 Type 2 diabetes mellitus with foot ulcer F17.218 Nicotine dependence, cigarettes, with other nicotine-induced  disorders F14.14 Cocaine abuse with cocaine-induced mood disorder Wound Cleansing Wound #1 Left,Proximal Lower Leg o Clean wound with Normal Saline. Wound #2 Left,Distal,Lateral Lower Leg o Clean wound with Normal Saline. Wound #3 Left,Dorsal Foot o Clean wound with Normal Saline. Wound #4 Right,Anterior Lower Leg o Clean wound with Normal Saline. Wound #5 Right,Dorsal Foot o Clean wound with Normal Saline. Wound #6 Left,Posterior Lower Leg o Clean wound with Normal Saline. Wound #7 Lower Leg o Clean wound with Normal Saline. Anesthetic Wound #1 Left,Proximal Lower Leg o Topical Lidocaine 4% cream applied to wound bed prior to debridement Wound #2 Left,Distal,Lateral Lower Leg o Topical Lidocaine 4% cream applied to wound bed prior to debridement Wound #3 Left,Dorsal Foot o Topical Lidocaine 4% cream applied to wound bed prior to debridement Wound #4 Right,Anterior Lower Leg o Topical Lidocaine 4% cream applied to wound bed prior to debridement Wound #5 Right,Dorsal Foot o Topical Lidocaine 4% cream applied to wound bed prior to debridement Wound #6 Left,Posterior Lower Leg LILJA, SOLAND ([id number]) o Topical Lidocaine 4% cream applied to wound bed prior to debridement Wound #7 Lower Leg o Topical Lidocaine 4% cream applied to wound bed prior to debridement Primary Wound Dressing Wound #1 Left,Proximal Lower Leg o Santyl Ointment Wound #2 Left,Distal,Lateral Lower Leg o Santyl Ointment Wound #3 Left,Dorsal Foot o Santyl Ointment Wound #4 Right,Anterior Lower Leg o Santyl Ointment Wound #5 Right,Dorsal Foot o Santyl Ointment Wound #6 Left,Posterior Lower Leg o Santyl Ointment Wound #7 Lower Leg o Santyl Ointment Secondary Dressing Wound #1 Left,Proximal Lower Leg o Other - telfa pads and wrap with gauze dressing Wound #2 Left,Distal,Lateral Lower Leg o Other - telfa pads and wrap with gauze dressing Wound #3  Left,Dorsal Foot o Other - telfa pads and wrap with gauze dressing Wound #4 Right,Anterior Lower Leg o Other - telfa pads and wrap with gauze dressing Wound #5 Right,Dorsal Foot o Other - telfa pads and wrap with gauze dressing Wound #6 Left,Posterior Lower Leg o Other - telfa pads and wrap with gauze dressing Wound #7 Lower Leg o Other - telfa pads and wrap with gauze dressing Dressing Change Frequency Wound #1 Left,Proximal Lower Leg o Change dressing every day. Wound #2 Left,Distal,Lateral Lower Leg o Change dressing every day. AUNESTI, PELLEGRINO ([id number]) Wound #3 Left,Dorsal Foot o Change dressing every day. Wound #4 Right,Anterior Lower Leg o Change dressing every day. Wound #5 Right,Dorsal Foot o Change dressing every day. Wound #6 Left,Posterior Lower Leg o Change dressing every day. Wound #  7 Lower Leg o Change dressing every day. Follow-up Appointments Wound #1 Left,Proximal Lower Leg o Return Appointment in 2 weeks. Wound #2 Left,Distal,Lateral Lower Leg o Return Appointment in 2 weeks. Wound #3 Left,Dorsal Foot o Return Appointment in 2 weeks. Wound #4 Right,Anterior Lower Leg o Return Appointment in 2 weeks. Wound #5 Right,Dorsal Foot o Return Appointment in 2 weeks. Wound #6 Left,Posterior Lower Leg o Return Appointment in 2 weeks. Wound #7 Lower Leg o Return Appointment in 2 weeks. Electronic Signature(s) Signed: 09/10/2017 3:24:39 PM By: Roger Shelter Signed: 09/10/2017 4:27:13 PM By: Christin Fudge MD, FACS Entered By: Roger Shelter on 09/10/2017 13:50:08 [name] ([id number]) -------------------------------------------------------------------------------- Problem List Details Patient Name: [name] Date of Service: 09/10/2017 12:30 PM Medical Record Number: [id number] Patient Account Number: [id number] Date of Birth/Sex: Jan 10, 1966 (51 y.o. 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 of 09/10/2017 Yes muscle L97.523 Non-pressure chronic ulcer of other part of left foot with necrosis of 09/10/2017 Yes muscle L97.212 Non-pressure chronic ulcer of right calf with fat layer exposed 09/10/2017 Yes L97.222 Non-pressure chronic ulcer of left calf with fat layer exposed 09/10/2017 Yes Inactive Problems Resolved Problems Electronic Signature(s) Signed: 09/10/2017 1:49:22 PM By: Christin Fudge MD, FACS Previous Signature: 09/10/2017 1:25:50 PM Version By: Christin Fudge MD, FACS Entered By: Christin Fudge on 09/10/2017 13:49:22 [name] ([id number]) -------------------------------------------------------------------------------- Progress Note Details Patient Name: [name] Date of Service: 09/10/2017 12:30 PM Medical Record Number: [id number] Patient Account Number: [id number] Date of Birth/Sex: 21-Nov-1965 (51 y.o. Female) Treating RN: Cornell Barman Primary Care Provider: Tula Nakayama Other Clinician: Referring Provider: Tula Nakayama Treating Provider/Extender: Frann Rider in Treatment: 0 Subjective Chief Complaint Information obtained from Patient Patients presents for treatment of an open diabetic ulcer to bilateral lower extremities on the cars and the dorsum of both feet for at least 3 months History of Present Illness (HPI) The following HPI elements were documented for the patient's wound: Location: Bilateral  feet and both lower extremities, multiple ulcerations Quality: Patient reports experiencing a sharp pain to affected area(s). Severity: Patient states wound are getting worse. Duration: Patient has had the wound for > 3 months prior to seeking treatment at the wound center Timing: Pain in wound is constant (hurts all the time) Context: The wound would happen gradually Modifying Factors: Other treatment(s) tried include:inpatient treatment and is awaiting an orthopedic appointment with Dr. Sharol Given Associated Signs and Symptoms: Patient reports having foul odor. 51 year old patient was recently seen by the vascular surgeon Dr. Bridgett Larsson, for bilateral foot ulcers. She has multiple health issues including polysubstance abuse and was noted to develop blisters about a month ago. She is known to have diabetes mellitus, hyperlipidemia, hypertension, alcohol abuse, chronic combined systolic and diastolic CHF, chronic kidney disease stage III, cocaine abuse,diabetic Charcot foot, hypertensive cardiomyopathy with ejection fraction being 20-25% and tobacco abuse. During her recent hospitalization she was put on Santyl ointment and was asked to follow-up with the wound care. She was treated with clindamycin for cellulitis. On Dr. Lianne Moris office visit on 09/03/2017 he recommended no vascular intervention at the present time. Her  most recent hemoglobin A1c was more than 15.5% Wound History Patient presents with 2 open wounds that have been present for approximately 05/2017. Patient has been treating wounds in the following manner: yes. Laboratory tests have not been performed in the last month. Patient reportedly has not tested positive for an antibiotic resistant organism. Patient reportedly has not tested positive for osteomyelitis. Patient reportedly has had testing performed to evaluate circulation in the legs. Patient experiences the following problems associated with their wounds: swelling. Patient  History Information obtained from Patient, Caregiver. Allergies No Known Drug Allergies Family History Cancer - Siblings, Heart Disease - Father,Siblings, Hypertension - Father,Siblings, Kidney Disease - Siblings, Seizures, No family history of Diabetes, Lung Disease, Stroke, Thyroid Problems, Tuberculosis. DAAIYAH, BAUMERT ([id number]) Social History Smoker, current status unknown, Marital Status - Widowed, Alcohol Use - Never, Drug Use - No History, Caffeine Use - Never. Medical History Eyes Denies history of Cataracts, Glaucoma, Optic Neuritis Ear/Nose/Mouth/Throat Denies history of Chronic sinus problems/congestion, Middle ear problems Hematologic/Lymphatic Patient has history of Anemia Denies history of Hemophilia, Human Immunodeficiency Virus, Lymphedema, Sickle Cell Disease Respiratory Patient has history of Chronic Obstructive Pulmonary Disease (COPD), Sleep Apnea Denies history of Aspiration, Asthma, Pneumothorax, Tuberculosis Cardiovascular Patient has history of Arrhythmia, Congestive Heart Failure, Coronary Artery Disease, Hypertension, Myocardial Infarction Denies history of Deep Vein Thrombosis, Peripheral Arterial Disease, Peripheral Venous Disease, Phlebitis, Vasculitis Gastrointestinal Denies history of Cirrhosis , Colitis, Crohn s, Hepatitis A Endocrine Patient has history of Type II Diabetes Denies history of Type I Diabetes Genitourinary Denies history of End Stage Renal Disease Immunological Denies history of Lupus Erythematosus, Raynaud s, Scleroderma Integumentary (Skin) Denies history of History of Burn, History of pressure wounds Musculoskeletal Patient has history of Rheumatoid Arthritis, Osteoarthritis Denies history of Gout, Osteomyelitis Neurologic Denies history of Dementia, Neuropathy, Quadriplegia, Paraplegia, Seizure Disorder Oncologic Denies history of Received Chemotherapy, Received Radiation Psychiatric Patient has history of  Confinement Anxiety Denies history of Anorexia/bulimia Patient is treated with Controlled Diet, Insulin, Oral Agents. Blood sugar is not tested. Review of Systems (ROS) Constitutional Symptoms (General Health) Denies complaints or symptoms of Fatigue, Fever, Chills, Marked Weight Change. Eyes Complains or has symptoms of Dry Eyes, Glasses / Contacts - glasses. Denies complaints or symptoms of Vision Changes. Ear/Nose/Mouth/Throat Denies complaints or symptoms of Difficult clearing ears, Sinusitis. Hematologic/Lymphatic Complains or has symptoms of Bleeding / Clotting Disorders - on plavix. Respiratory Complains or has symptoms of Chronic or frequent coughs, Shortness of Breath. Cardiovascular Complains or has symptoms of LE edema. Denies complaints or symptoms of Chest pain. Gastrointestinal Denies complaints or symptoms of Frequent diarrhea, Nausea, Vomiting. Endocrine QUINTAVIA, ROGSTAD ([id number]) Denies complaints or symptoms of Hepatitis, Thyroid disease, Polydypsia (Excessive Thirst). Genitourinary Complains or has symptoms of Incontinence/dribbling. Denies complaints or symptoms of Kidney failure/ Dialysis. Immunological Denies complaints or symptoms of Hives, Itching. Integumentary (Skin) Denies complaints or symptoms of Wounds, Bleeding or bruising tendency, Breakdown, Swelling. Musculoskeletal Complains or has symptoms of Muscle Pain - thighs bilateral. Denies complaints or symptoms of Muscle Weakness. Oncologic The patient has no complaints or symptoms. Psychiatric Complains or has symptoms of Anxiety - takes prozac, Claustrophobia. Medications amiodarone 200 mg tablet oral tablet oral Objective Constitutional Pulse regular. Respirations normal and unlabored. Afebrile. Vitals Time Taken: 12:48 PM, Height: 61 in, Source: Stated, Weight: 130 lbs, Source: Stated, BMI: 24.6, Temperature: 98.2  F, Pulse: 74 bpm, Respiratory Rate: 18 breaths/min, Blood Pressure:  161/93 mmHg. Eyes Nonicteric. Reactive to light. Ears, Nose, Mouth, and Throat Lips, teeth,  and gums WNL.Marland Kitchen Moist mucosa without lesions. Neck supple and nontender. No palpable supraclavicular or cervical adenopathy. Normal sized without goiter. Respiratory WNL. No retractions.. Cardiovascular Pedal Pulses WNL. recent vascular consult noted. No clubbing, cyanosis or edema. Gastrointestinal (GI) Abdomen without masses or tenderness.. No liver or spleen enlargement or tenderness.. Lymphatic No adneopathy. No adenopathy. No adenopathy. IREOLUWA, GORSLINE ([id number]) Musculoskeletal Adexa without tenderness or enlargement.. Digits and nails w/o clubbing, cyanosis, infection, petechiae, ischemia, or inflammatory conditions.Marland Kitchen Psychiatric Judgement and insight Intact.. No evidence of depression, anxiety, or agitation.. General Notes: multiple necrotic large wounds both lower extremities on the carbs anterior posterior and on the dorsum of both feet with Charcot deformity of the feet. Extremely tender and no options to debride in the wound center Integumentary (Hair, Skin) No suspicious lesions. No crepitus or fluctuance. No peri-wound warmth or erythema. No masses.. Wound #1 status is Open. Original cause of wound was Gradually Appeared. The wound is located on the Left,Proximal Lower Leg. The wound measures 4.5cm length x 4.5cm width x 0.5cm depth; 15.904cm^2 area and 7.952cm^3 volume. There is Fat Layer (Subcutaneous Tissue) Exposed exposed. There is no tunneling or undermining noted. There is a medium amount of purulent drainage noted. Foul odor after cleansing was noted. The wound margin is flat and intact. There is no granulation within the wound bed. There is a large (67-100%) amount of necrotic tissue within the wound bed including Eschar and Adherent Slough. The periwound skin appearance exhibited: Scarring, Ecchymosis. Periwound temperature was noted as No Abnormality. The periwound  has tenderness on palpation. Wound #2 status is Open. Original cause of wound was Gradually Appeared. The wound is located on the Left,Distal,Lateral Lower Leg. The wound measures 0.5cm length x 0.9cm width x 0.2cm depth; 0.353cm^2 area and 0.071cm^3 volume. There is no tunneling or undermining noted. There is a medium amount of sanguinous drainage noted. The wound margin is flat and intact. There is no granulation within the wound bed. There is a large (67-100%) amount of necrotic tissue within the wound bed including Eschar and Adherent Slough. The periwound skin appearance exhibited: Hemosiderin Staining. The periwound skin appearance did not exhibit: Callus, Crepitus, Excoriation, Induration, Rash, Scarring, Dry/Scaly, Maceration, Atrophie Blanche, Cyanosis, Ecchymosis, Mottled, Pallor, Rubor, Erythema. Wound #3 status is Open. Original cause of wound was Gradually Appeared. The wound is located on the Left,Dorsal Foot. The wound measures 5.5cm length x 4.5cm width x 0.3cm depth; 19.439cm^2 area and 5.832cm^3 volume. There is no tunneling or undermining noted. There is a medium amount of purulent drainage noted. The wound margin is flat and intact. There is small (1-33%) granulation within the wound bed. There is a large (67-100%) amount of necrotic tissue within the wound bed including Eschar and Adherent Slough. The periwound skin appearance exhibited: Scarring, Dry/Scaly, Ecchymosis, Hemosiderin Staining. The periwound skin appearance did not exhibit: Callus, Crepitus, Excoriation, Induration, Rash, Maceration, Atrophie Blanche, Cyanosis, Mottled, Pallor, Rubor, Erythema. Wound #4 status is Open. Original cause of wound was Gradually Appeared. The wound is located on the Right,Anterior Lower Leg. The wound measures 0.7cm length x 0.8cm width x 0.1cm depth; 0.44cm^2 area and 0.044cm^3 volume. There is a none present amount of drainage noted. The wound margin is flat and intact. There is no  granulation within the wound bed. There is a large (67-100%) amount of necrotic tissue within the wound bed including Eschar. The periwound skin appearance exhibited: Hemosiderin Staining. The periwound skin appearance did not exhibit: Callus, Crepitus, Excoriation, Induration, Rash, Scarring, Dry/Scaly, Maceration,  Atrophie Blanche, Cyanosis, Ecchymosis, Mottled, Pallor, Rubor, Erythema. Wound #5 status is Open. Original cause of wound was Gradually Appeared. The wound is located on the Right,Dorsal Foot. The wound measures 7.5cm length x 5.2cm width x 0.5cm depth; 30.631cm^2 area and 15.315cm^3 volume. There is no tunneling or undermining noted. There is a medium amount of serosanguineous drainage noted. Foul odor after cleansing was noted. The wound margin is flat and intact. There is no granulation within the wound bed. There is a large (67-100%) amount of necrotic tissue within the wound bed. The periwound skin appearance exhibited: Scarring. Wound #6 status is Open. Original cause of wound was Gradually Appeared. The wound is located on the Left,Posterior Lower Leg. The wound measures 12cm length x 7cm width x 0.3cm depth; 65.973cm^2 area and 19.792cm^3 volume. There is no tunneling or undermining noted. There is a small amount of serosanguineous drainage noted. The wound margin is flat and intact. There is small (1-33%) red granulation within the wound bed. There is a large (67-100%) amount of necrotic tissue within the wound bed including Eschar and Adherent Slough. The periwound skin appearance exhibited: Induration, Ecchymosis, Hemosiderin Staining. Wound #7 status is Open. Original cause of wound was Gradually Appeared. The wound is located on the Lower Leg. The ZAKIRAH, WEINGART ([id number]) wound measures 3.8cm length x 3cm width x 0.8cm depth; 8.954cm^2 area and 7.163cm^3 volume. There is Fat Layer (Subcutaneous Tissue) Exposed exposed. There is no tunneling or undermining noted.  There is a large amount of serosanguineous drainage noted. The wound margin is flat and intact. There is no granulation within the wound bed. There is a large (67-100%) amount of necrotic tissue within the wound bed including Eschar and Adherent Slough. The periwound skin appearance exhibited: Ecchymosis, Hemosiderin Staining. Assessment Active Problems ICD-10 E11.621 - Type 2 diabetes mellitus with foot ulcer F17.218 - Nicotine dependence, cigarettes, with other nicotine-induced disorders F14.14 - Cocaine abuse with cocaine-induced mood disorder L97.513 - Non-pressure chronic ulcer of other part of right foot with necrosis of muscle L97.523 - Non-pressure chronic ulcer of other part of left foot with necrosis of muscle L97.212 - Non-pressure chronic ulcer of right calf with fat layer exposed L97.222 - Non-pressure chronic ulcer of left calf with fat layer exposed this 51 year old diabetic with poorly controlled glucose and polysubstance drug abuse has not been very compliant with any of her treatment and at this stage has multiple painful ulcerations both lower extremities which cannot be treated at the outpatient wound center with any debridement. She is rather noncompliant and after much discussion my recommendations are: 1. Continue with Santyl ointment locally to be changed daily and wash the wounds with soap and water. 2. good control of her diabetes mellitus 3. Adequate protein, vitamin A, vitamin C and zinc 4. Follow-up with Dr. Sharol Given for an opinion regarding debridement in the OR 5. I have spent 3 minutes discussing with her the need to completely give up smoking and have discussed the risks benefits alternatives and all the possible methods 6. review at the wound clinic after her surgical opinion she and her sisters who are at the bedside had several questions which I have answered to their satisfaction Plan Wound Cleansing: Wound #1 Left,Proximal Lower Leg: Clean wound with  Normal Saline. Wound #2 Left,Distal,Lateral Lower Leg: Clean wound with Normal Saline. Wound #3 Left,Dorsal Foot: Clean wound with Normal Saline. Wound #4 Right,Anterior Lower Leg: Clean wound with Normal Saline. Wound #5 Right,Dorsal Foot: Clean wound with Normal Saline. Wound #6  Left,Posterior Lower Leg: YUKI, BRUNSMAN ([id number]) Clean wound with Normal Saline. Wound #7 Lower Leg: Clean wound with Normal Saline. Anesthetic: Wound #1 Left,Proximal Lower Leg: Topical Lidocaine 4% cream applied to wound bed prior to debridement Wound #2 Left,Distal,Lateral Lower Leg: Topical Lidocaine 4% cream applied to wound bed prior to debridement Wound #3 Left,Dorsal Foot: Topical Lidocaine 4% cream applied to wound bed prior to debridement Wound #4 Right,Anterior Lower Leg: Topical Lidocaine 4% cream applied to wound bed prior to debridement Wound #5 Right,Dorsal Foot: Topical Lidocaine 4% cream applied to wound bed prior to debridement Wound #6 Left,Posterior Lower Leg: Topical Lidocaine 4% cream applied to wound bed prior to debridement Wound #7 Lower Leg: Topical Lidocaine 4% cream applied to wound bed prior to debridement Primary Wound Dressing: Wound #1 Left,Proximal Lower Leg: Santyl Ointment Wound #2 Left,Distal,Lateral Lower Leg: Santyl Ointment Wound #3 Left,Dorsal Foot: Santyl Ointment Wound #4 Right,Anterior Lower Leg: Santyl Ointment Wound #5 Right,Dorsal Foot: Santyl Ointment Wound #6 Left,Posterior Lower Leg: Santyl Ointment Wound #7 Lower Leg: Santyl Ointment Secondary Dressing: Wound #1 Left,Proximal Lower Leg: Other - telfa pads and wrap with gauze dressing Wound #2 Left,Distal,Lateral Lower Leg: Other - telfa pads and wrap with gauze dressing Wound #3 Left,Dorsal Foot: Other - telfa pads and wrap with gauze dressing Wound #4 Right,Anterior Lower Leg: Other - telfa pads and wrap with gauze dressing Wound #5 Right,Dorsal Foot: Other - telfa pads and wrap  with gauze dressing Wound #6 Left,Posterior Lower Leg: Other - telfa pads and wrap with gauze dressing Wound #7 Lower Leg: Other - telfa pads and wrap with gauze dressing Dressing Change Frequency: Wound #1 Left,Proximal Lower Leg: Change dressing every day. Wound #2 Left,Distal,Lateral Lower Leg: Change dressing every day. Wound #3 Left,Dorsal Foot: Change dressing every day. Wound #4 Right,Anterior Lower Leg: Change dressing every day. Wound #5 Right,Dorsal Foot: Change dressing every day. Wound #6 Left,Posterior Lower Leg: DANNY, ZIMNY. ([id number]) Change dressing every day. Wound #7 Lower Leg: Change dressing every day. Follow-up Appointments: Wound #1 Left,Proximal Lower Leg: Return Appointment in 2 weeks. Wound #2 Left,Distal,Lateral Lower Leg: Return Appointment in 2 weeks. Wound #3 Left,Dorsal Foot: Return Appointment in 2 weeks. Wound #4 Right,Anterior Lower Leg: Return Appointment in 2 weeks. Wound #5 Right,Dorsal Foot: Return Appointment in 2 weeks. Wound #6 Left,Posterior Lower Leg: Return Appointment in 2 weeks. Wound #7 Lower Leg: Return Appointment in 2 weeks. this 51 year old diabetic with poorly controlled glucose and polysubstance drug abuse has not been very compliant with any of her treatment and at this stage has multiple painful ulcerations both lower extremities which cannot be treated at the outpatient wound center with any debridement. She is rather noncompliant and after much discussion my recommendations are: 1. Continue with Santyl ointment locally to be changed daily and wash the wounds with soap and water. 2. good control of her diabetes mellitus 3. Adequate protein, vitamin A, vitamin C and zinc 4. Follow-up with Dr. Sharol Given for an opinion regarding debridement in the OR 5. I have spent 3 minutes discussing with her the need to completely give up smoking and have discussed the risks benefits alternatives and all the possible methods 6.  review at the wound clinic after her surgical opinion she and her sisters who are at the bedside had several questions which I have answered to their satisfaction Electronic Signature(s) Signed: 09/10/2017 2:02:33 PM By: Christin Fudge MD, FACS Entered By: Christin Fudge on 09/10/2017 14:02:32 [name] ([id number]) -------------------------------------------------------------------------------- ROS/PFSH Details Patient  Name: [name] Date of Service: 09/10/2017 12:30 PM Medical Record Number: [id number] Patient Account Number: [id number] Date of Birth/Sex: 1966-10-04 (51 y.o. Female) Treating RN: Roger Shelter Primary Care Provider: Tula Nakayama Other Clinician: Referring Provider: Tula Nakayama Treating Provider/Extender: Frann Rider in Treatment: 0 Information Obtained From Patient Caregiver Wound History Do you currently have one or more open woundso Yes How many open wounds do you currently haveo 2 Approximately how long have you had your woundso 05/2017 How have you been treating your wound(s) until nowo yes Has your wound(s) ever healed and then re-openedo No Have you had any lab work done in the past montho No Have you tested positive for an antibiotic resistant organism (MRSA, VRE)o No Have you tested positive for osteomyelitis (bone infection)o No Have you had any tests for circulation on your legso Yes Who ordered the testo Dr.Black Have you had other problems associated with your woundso Swelling Constitutional Symptoms (General Health) Complaints and Symptoms: Negative for: Fatigue; Fever; Chills; Marked Weight Change Eyes Complaints and Symptoms: Positive for: Dry Eyes; Glasses / Contacts - glasses Negative for: Vision Changes Medical History: Negative for: Cataracts; Glaucoma; Optic Neuritis Ear/Nose/Mouth/Throat Complaints and Symptoms: Negative for: Difficult clearing ears; Sinusitis Medical History: Negative for: Chronic  sinus problems/congestion; Middle ear problems Hematologic/Lymphatic Complaints and Symptoms: Positive for: Bleeding / Clotting Disorders - on plavix Medical History: Positive for: Anemia Negative for: Hemophilia; Human Immunodeficiency Virus; Lymphedema; Sickle Cell Disease Respiratory MIKHALA, KENAN. ([id number]) Complaints and Symptoms: Positive for: Chronic or frequent coughs; Shortness of Breath Medical History: Positive for: Chronic Obstructive Pulmonary Disease (COPD); Sleep Apnea Negative for: Aspiration; Asthma; Pneumothorax; Tuberculosis Cardiovascular Complaints and Symptoms: Positive for: LE edema Negative for: Chest pain Medical History: Positive for: Arrhythmia; Congestive Heart Failure; Coronary Artery Disease; Hypertension; Myocardial Infarction Negative for: Deep Vein Thrombosis; Peripheral Arterial Disease; Peripheral Venous Disease; Phlebitis; Vasculitis Gastrointestinal Complaints and Symptoms: Negative for: Frequent diarrhea; Nausea; Vomiting Medical History: Negative for: Cirrhosis ; Colitis; Crohnos; Hepatitis A Endocrine Complaints and Symptoms: Negative for: Hepatitis; Thyroid disease; Polydypsia (Excessive Thirst) Medical History: Positive for: Type II Diabetes Negative for: Type I Diabetes Time with diabetes: 5 years Treated with: Insulin, Oral agents, Diet Blood sugar tested every day: No Genitourinary Complaints and Symptoms: Positive for: Incontinence/dribbling Negative for: Kidney failure/ Dialysis Medical History: Negative for: End Stage Renal Disease Immunological Complaints and Symptoms: Negative for: Hives; Itching Medical History: Negative for: Lupus Erythematosus; Raynaudos; Scleroderma Integumentary (Skin) Complaints and Symptoms: Negative for: Wounds; Bleeding or bruising tendency; Breakdown; Swelling ELAINAH, RHYNE ([id number]) Medical History: Negative for: History of Burn; History of pressure  wounds Musculoskeletal Complaints and Symptoms: Positive for: Muscle Pain - thighs bilateral Negative for: Muscle Weakness Medical History: Positive for: Rheumatoid Arthritis; Osteoarthritis Negative for: Gout; Osteomyelitis Psychiatric Complaints and Symptoms: Positive for: Anxiety - takes prozac; Claustrophobia Medical History: Positive for: Confinement Anxiety Negative for: Anorexia/bulimia Neurologic Medical History: Negative for: Dementia; Neuropathy; Quadriplegia; Paraplegia; Seizure Disorder Oncologic Complaints and Symptoms: No Complaints or Symptoms Medical History: Negative for: Received Chemotherapy; Received Radiation Immunizations Pneumococcal Vaccine: Received Pneumococcal Vaccination: No Implantable Devices Family and Social History Cancer: Yes - Siblings; Diabetes: No; Heart Disease: Yes - Father,Siblings; Hypertension: Yes - Father,Siblings; Kidney Disease: Yes - Siblings; Lung Disease: No; Seizures: Yes; Stroke: No; Thyroid Problems: No; Tuberculosis: No; Smoker, current status unknown; Marital Status - Widowed; Alcohol Use: Never; Drug Use: No History; Caffeine Use: Never; Advanced Directives: No; Patient does not want information on Advanced Directives; Do not resuscitate:  No; Living Will: No; Medical Power of Attorney: No Physician Affirmation I have reviewed and agree with the above information. Electronic Signature(s) Signed: 09/10/2017 3:24:39 PM By: Roger Shelter Signed: 09/10/2017 4:27:13 PM By: Christin Fudge MD, FACS Entered By: Christin Fudge on 09/10/2017 13:24:59 [name] ([id number]) -------------------------------------------------------------------------------- SuperBill Details Patient Name: [name] Date of Service: 09/10/2017 Medical Record Number: [id number] Patient Account Number: [id number] Date of Birth/Sex: 01-Dec-1965 (51 y.o. Female) Treating RN: Cornell Barman Primary Care Provider: Tula Nakayama Other  Clinician: Referring Provider: Tula Nakayama Treating Provider/Extender: Frann Rider in Treatment: 0 Diagnosis Coding ICD-10 Codes Code Description E11.621 Type 2 diabetes mellitus with foot ulcer F17.218 Nicotine dependence, cigarettes, with other nicotine-induced disorders F14.14 Cocaine abuse with cocaine-induced mood disorder L97.513 Non-pressure chronic ulcer of other part of right foot with necrosis of muscle L97.523 Non-pressure chronic ulcer of other part of left foot with necrosis of muscle L97.212 Non-pressure chronic ulcer of right calf with fat layer exposed L97.222 Non-pressure chronic ulcer of left calf with fat layer exposed Facility Procedures CPT4 Code: 97026378 Description: 58850 - WOUND CARE VISIT-LEV 3 EST PT Modifier: Quantity: 1 CPT4 Code: 27741287 Description: 99406-SMOKING CESSATION 3-10MINS ICD-10 Diagnosis Description E11.621 Type 2 diabetes mellitus with foot ulcer F17.218 Nicotine dependence, cigarettes, with other nicotine-induced di F14.14 Cocaine abuse with cocaine-induced mood disorder Modifier: sorders Quantity: 1 Physician Procedures CPT4 Code Description: 8676720 94709 - WC PHYS LEVEL 4 - NEW PT ICD-10 Diagnosis Description E11.621 Type 2 diabetes mellitus with foot ulcer L97.513 Non-pressure chronic ulcer of other part of right foot with necr L97.523 Non-pressure chronic ulcer of  other part of left foot with necro L97.222 Non-pressure chronic ulcer of left calf with fat layer exposed Modifier: osis of muscle sis of muscle Quantity: 1 CPT4 Code Description: 62836 62947- SMOKING CESSATION 3-10 MINS ICD-10 Diagnosis Description E11.621 Type 2 diabetes mellitus with foot ulcer F17.218 Nicotine dependence, cigarettes, with other nicotine-induced dis F14.14 Cocaine abuse with  cocaine-induced mood disorder Modifier: orders Quantity: 1 Electronic Signature(s) Signed: 09/10/2017 2:03:09 PM By: Christin Fudge MD, FACS [name]  ([id number]) Entered By: Christin Fudge on 09/10/2017 14:03:09

## 2017-09-12 DIAGNOSIS — I509 Heart failure, unspecified: Secondary | ICD-10-CM | POA: Diagnosis not present

## 2017-09-13 ENCOUNTER — Ambulatory Visit (INDEPENDENT_AMBULATORY_CARE_PROVIDER_SITE_OTHER): Payer: Medicare Other | Admitting: Orthopedic Surgery

## 2017-09-13 ENCOUNTER — Telehealth: Payer: Self-pay | Admitting: Family Medicine

## 2017-09-13 ENCOUNTER — Encounter (INDEPENDENT_AMBULATORY_CARE_PROVIDER_SITE_OTHER): Payer: Self-pay | Admitting: Orthopedic Surgery

## 2017-09-13 ENCOUNTER — Other Ambulatory Visit (INDEPENDENT_AMBULATORY_CARE_PROVIDER_SITE_OTHER): Payer: Self-pay

## 2017-09-13 VITALS — Ht 61.0 in | Wt 138.0 lb

## 2017-09-13 DIAGNOSIS — I70263 Atherosclerosis of native arteries of extremities with gangrene, bilateral legs: Secondary | ICD-10-CM

## 2017-09-13 MED ORDER — DOXYCYCLINE HYCLATE 100 MG PO TABS: 100.0000 mg | ORAL_TABLET | Freq: Two times a day (BID) | ORAL | 0 refills | Status: DC

## 2017-09-13 NOTE — Telephone Encounter (Signed)
Patient has been calling last week stating she needs a letter from you to be able to qualify for medicaid again before it runs out end of the month. A letter stating why she needs medicaid. (I don't know why they are needing this from you because nobody has sent anything to the office from [facility] requesting it)

## 2017-09-13 NOTE — Telephone Encounter (Signed)
Pls type a letter stating that patient has multiple chronic medical conditions that are uncontrolled and potentially life threatening. It is my understanding that she continues  To require both  both medicid and medicare as her health has not improved,  And to my knowledge  her income remains fixed

## 2017-09-13 NOTE — Telephone Encounter (Signed)
[name] left a message Friday regarding previous messages, per Junious Dresser they have not heard anything. Please call Junious Dresser [phone]

## 2017-09-13 NOTE — Telephone Encounter (Signed)
Patient is requesting a letter stating why she needs her medicaid, medicaid coverage ends at the end of November. Cb#: [phone]
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
## 2017-09-13 NOTE — Telephone Encounter (Signed)
[facility] Cb#: [phone]   Requesting verbal for orders:  1x  A week for 2 weeks 2x a week for  2 weeks

## 2017-09-13 NOTE — Telephone Encounter (Signed)
[name] occupational home health with [facility] called stating they need a order for OT once a week for 2 weeks and OT twice a week for 2 weeks. Please call [name] at [phone]

## 2017-09-14 ENCOUNTER — Encounter (HOSPITAL_COMMUNITY): Payer: Self-pay

## 2017-09-14 ENCOUNTER — Other Ambulatory Visit: Payer: Self-pay

## 2017-09-14 ENCOUNTER — Telehealth (INDEPENDENT_AMBULATORY_CARE_PROVIDER_SITE_OTHER): Payer: Self-pay

## 2017-09-14 ENCOUNTER — Emergency Department (HOSPITAL_COMMUNITY)
Admission: EM | Admit: 2017-09-14 | Discharge: 2017-09-14 | Disposition: A | Discharge status: Home / Self Care | Payer: Medicare Other | Attending: Emergency Medicine | Admitting: Emergency Medicine

## 2017-09-14 ENCOUNTER — Other Ambulatory Visit: Payer: Self-pay | Admitting: *Deleted

## 2017-09-14 DIAGNOSIS — Z7901 Long term (current) use of anticoagulants: Secondary | ICD-10-CM | POA: Diagnosis not present

## 2017-09-14 DIAGNOSIS — Z7982 Long term (current) use of aspirin: Secondary | ICD-10-CM | POA: Insufficient documentation

## 2017-09-14 DIAGNOSIS — Z87891 Personal history of nicotine dependence: Secondary | ICD-10-CM | POA: Diagnosis not present

## 2017-09-14 DIAGNOSIS — R42 Dizziness and giddiness: Secondary | ICD-10-CM | POA: Diagnosis not present

## 2017-09-14 DIAGNOSIS — Z79899 Other long term (current) drug therapy: Secondary | ICD-10-CM | POA: Insufficient documentation

## 2017-09-14 DIAGNOSIS — E785 Hyperlipidemia, unspecified: Secondary | ICD-10-CM | POA: Insufficient documentation

## 2017-09-14 DIAGNOSIS — Z743 Need for continuous supervision: Secondary | ICD-10-CM | POA: Diagnosis not present

## 2017-09-14 DIAGNOSIS — J449 Chronic obstructive pulmonary disease, unspecified: Secondary | ICD-10-CM | POA: Insufficient documentation

## 2017-09-14 DIAGNOSIS — Z794 Long term (current) use of insulin: Secondary | ICD-10-CM | POA: Diagnosis not present

## 2017-09-14 DIAGNOSIS — I130 Hypertensive heart and chronic kidney disease with heart failure and stage 1 through stage 4 chronic kidney disease, or unspecified chronic kidney disease: Secondary | ICD-10-CM | POA: Insufficient documentation

## 2017-09-14 DIAGNOSIS — I10 Essential (primary) hypertension: Secondary | ICD-10-CM

## 2017-09-14 DIAGNOSIS — I5042 Chronic combined systolic (congestive) and diastolic (congestive) heart failure: Secondary | ICD-10-CM | POA: Insufficient documentation

## 2017-09-14 DIAGNOSIS — R030 Elevated blood-pressure reading, without diagnosis of hypertension: Secondary | ICD-10-CM | POA: Diagnosis not present

## 2017-09-14 DIAGNOSIS — N183 Chronic kidney disease, stage 3 (moderate): Secondary | ICD-10-CM | POA: Diagnosis not present

## 2017-09-14 DIAGNOSIS — E119 Type 2 diabetes mellitus without complications: Secondary | ICD-10-CM | POA: Insufficient documentation

## 2017-09-14 DIAGNOSIS — Z9181 History of falling: Secondary | ICD-10-CM | POA: Diagnosis not present

## 2017-09-14 LAB — COMPREHENSIVE METABOLIC PANEL
ALT: 17 U/L (ref 14–54)
AST: 20 U/L (ref 15–41)
Albumin: 2.7 g/dL — ABNORMAL LOW (ref 3.5–5.0)
Alkaline Phosphatase: 42 U/L (ref 38–126)
Anion gap: 7 (ref 5–15)
BUN: 24 mg/dL — ABNORMAL HIGH (ref 6–20)
CO2: 23 mmol/L (ref 22–32)
Calcium: 9.6 mg/dL (ref 8.9–10.3)
Chloride: 112 mmol/L — ABNORMAL HIGH (ref 101–111)
Creatinine, Ser: 1.45 mg/dL — ABNORMAL HIGH (ref 0.44–1.00)
GFR calc Af Amer: 47 mL/min — ABNORMAL LOW (ref >60)
GFR calc non Af Amer: 40 mL/min — ABNORMAL LOW (ref >60)
Glucose, Bld: 129 mg/dL — ABNORMAL HIGH (ref 65–99)
Potassium: 4.2 mmol/L (ref 3.5–5.1)
Sodium: 142 mmol/L (ref 135–145)
Total Bilirubin: 0.6 mg/dL (ref 0.3–1.2)
Total Protein: 6.9 g/dL (ref 6.5–8.1)

## 2017-09-14 LAB — CBC WITH DIFFERENTIAL/PLATELET
Basophils Absolute: 0 10*3/uL (ref 0.0–0.1)
Basophils Relative: 1 %
Eosinophils Absolute: 0.1 10*3/uL (ref 0.0–0.7)
Eosinophils Relative: 1 %
HCT: 35.8 % — ABNORMAL LOW (ref 36.0–46.0)
Hemoglobin: 10.8 g/dL — ABNORMAL LOW (ref 12.0–15.0)
Lymphocytes Relative: 13 %
Lymphs Abs: 1 10*3/uL (ref 0.7–4.0)
MCH: 29.3 pg (ref 26.0–34.0)
MCHC: 30.2 g/dL (ref 30.0–36.0)
MCV: 97.3 fL (ref 78.0–100.0)
Monocytes Absolute: 0.5 10*3/uL (ref 0.1–1.0)
Monocytes Relative: 6 %
Neutro Abs: 6.6 10*3/uL (ref 1.7–7.7)
Neutrophils Relative %: 79 %
Platelets: 398 10*3/uL (ref 150–400)
RBC: 3.68 MIL/uL — ABNORMAL LOW (ref 3.87–5.11)
RDW: 17.4 % — ABNORMAL HIGH (ref 11.5–15.5)
WBC: 8.3 10*3/uL (ref 4.0–10.5)

## 2017-09-14 LAB — TROPONIN I: Troponin I: <0.03 ng/mL (ref <0.03)

## 2017-09-14 MED ORDER — CARVEDILOL 12.5 MG PO TABS
25.0000 mg | ORAL_TABLET | Freq: Two times a day (BID) | ORAL | Status: DC
  Administered 2017-09-14: 25 mg via ORAL
  Filled 2017-09-14: qty 2

## 2017-09-14 MED ORDER — MECLIZINE HCL 25 MG PO TABS: 25.0000 mg | ORAL_TABLET | Freq: Three times a day (TID) | ORAL | 0 refills | Status: DC | PRN

## 2017-09-14 MED ORDER — HYDRALAZINE HCL 25 MG PO TABS
25.0000 mg | ORAL_TABLET | Freq: Once | ORAL | Status: AC
  Administered 2017-09-14: 25 mg via ORAL
  Filled 2017-09-14: qty 1

## 2017-09-14 MED ORDER — MECLIZINE HCL 12.5 MG PO TABS
25.0000 mg | ORAL_TABLET | Freq: Once | ORAL | Status: AC
  Administered 2017-09-14: 25 mg via ORAL
  Filled 2017-09-14: qty 2

## 2017-09-14 MED ORDER — TRAMADOL HCL 50 MG PO TABS: 50.0000 mg | ORAL_TABLET | Freq: Four times a day (QID) | ORAL | 0 refills | Status: DC | PRN

## 2017-09-14 NOTE — Telephone Encounter (Signed)
Letter typed for dr to sign

## 2017-09-14 NOTE — ED Triage Notes (Addendum)
EMS called out due to high blood pressure. Home health nurse called Dr Moshe Cipro and was told to send to ED.. BP 240/140. Dizziness when raises her head up .denies CP, SOB, or HA. Pt currently on abt for ulcers on legs. Pt reports she only missed one dose of BP med

## 2017-09-14 NOTE — Telephone Encounter (Signed)
faxed

## 2017-09-14 NOTE — Discharge Instructions (Signed)
As we discussed, though you do have high blood pressure (hypertension), fortunately it is not immediately dangerous at this time and does not need emergency intervention or admission to the hospital.  If we add to or change your regular medications, we may cause more harm than good - it is more appropriate for your primary care doctor to evaluate you in clinic and decide if any medication changes are needed.  Please follow up in clinic as recommended in these papers.   ° °Return to the Emergency Department (ED) if you experience any worsening chest pain/pressure/tightness, difficulty breathing, or sudden sweating, or other symptoms that concern you. ° ° °Hypertension °Hypertension, commonly called high blood pressure, is when the force of blood pumping through your arteries is too strong. Your arteries are the blood vessels that carry blood from your heart throughout your body. A blood pressure reading consists of a higher number over a lower number, such as 110/72. The higher number (systolic) is the pressure inside your arteries when your heart pumps. The lower number (diastolic) is the pressure inside your arteries when your heart relaxes. Ideally you want your blood pressure below 120/80. °Hypertension forces your heart to work harder to pump blood. Your arteries may become narrow or stiff. Having hypertension puts you at risk for heart disease, stroke, and other problems.  °RISK FACTORS °Some risk factors for high blood pressure are controllable. Others are not.  °Risk factors you cannot control include:  °Race. You may be at higher risk if you are African American. °Age. Risk increases with age. °Gender. Men are at higher risk than women before age 45 years. After age 65, women are at higher risk than men. °Risk factors you can control include: °Not getting enough exercise or physical activity. °Being overweight. °Getting too much fat, sugar, calories, or salt in your diet. °Drinking too much alcohol. °SIGNS  AND SYMPTOMS °Hypertension does not usually cause signs or symptoms. Extremely high blood pressure (hypertensive crisis) may cause headache, anxiety, shortness of breath, and nosebleed. °DIAGNOSIS  °To check if you have hypertension, your health care provider will measure your blood pressure while you are seated, with your arm held at the level of your heart. It should be measured at least twice using the same arm. Certain conditions can cause a difference in blood pressure between your right and left arms. A blood pressure reading that is higher than normal on one occasion does not mean that you need treatment. If one blood pressure reading is high, ask your health care provider about having it checked again. °TREATMENT  °Treating high blood pressure includes making lifestyle changes and possibly taking medicine. Living a healthy lifestyle can help lower high blood pressure. You may need to change some of your habits. °Lifestyle changes may include: °Following the DASH diet. This diet is high in fruits, vegetables, and whole grains. It is low in salt, red meat, and added sugars. °Getting at least 2½ hours of brisk physical activity every week. °Losing weight if necessary. °Not smoking. °Limiting alcoholic beverages. °Learning ways to reduce stress. ° If lifestyle changes are not enough to get your blood pressure under control, your health care provider may prescribe medicine. You may need to take more than one. Work closely with your health care provider to understand the risks and benefits. °HOME CARE INSTRUCTIONS °Have your blood pressure rechecked as directed by your health care provider.   °Take medicines only as directed by your health care provider. Follow the directions carefully. Blood   pressure medicines must be taken as prescribed. The medicine does not work as well when you skip doses. Skipping doses also puts you at risk for problems.   °Do not smoke.   °Monitor your blood pressure at home as directed by  your health care provider.  °SEEK MEDICAL CARE IF:  °You think you are having a reaction to medicines taken. °You have recurrent headaches or feel dizzy. °You have swelling in your ankles. °You have trouble with your vision. °SEEK IMMEDIATE MEDICAL CARE IF: °You develop a severe headache or confusion. °You have unusual weakness, numbness, or feel faint. °You have severe chest or abdominal pain. °You vomit repeatedly. °You have trouble breathing. °MAKE SURE YOU:  °Understand these instructions. °Will watch your condition. °Will get help right away if you are not doing well or get worse. °Document Released: 10/12/2005 Document Revised: 02/26/2014 Document Reviewed: 08/04/2013 °ExitCare® Patient Information ©2015 [employer]. This information is not intended to replace advice given to you by your health care provider. Make sure you discuss any questions you have with your health care provider. ° °How to Take Your Blood Pressure °HOW DO I GET A BLOOD PRESSURE MACHINE? °You can buy an electronic home blood pressure machine at your local pharmacy. Insurance will sometimes cover the cost if you have a prescription. °Ask your doctor what type of machine is best for you. There are different machines for your arm and your wrist. °If you decide to buy a machine to check your blood pressure on your arm, first check the size of your arm so you can buy the right size cuff. To check the size of your arm:   °Use a measuring tape that shows both inches and centimeters.   °Wrap the measuring tape around the upper-middle part of your arm. You may need someone to help you measure.   °Write down your arm measurement in both inches and centimeters.   °To measure your blood pressure correctly, it is important to have the right size cuff.   °If your arm is up to 13 inches (up to 34 centimeters), get an adult cuff size. °If your arm is 13 to 17 inches (35 to 44 centimeters), get a large adult cuff size.   ° If your arm is 17 to 20 inches  (45 to 52 centimeters), get an adult thigh cuff.   °WHAT DO THE NUMBERS MEAN?  °There are two numbers that make up your blood pressure. For example: 120/80. °The first number (120 in our example) is called the "systolic pressure." It is a measure of the pressure in your blood vessels when your heart is pumping blood. °The second number (80 in our example) is called the "diastolic pressure." It is a measure of the pressure in your blood vessels when your heart is resting between beats. °Your doctor will tell you what your blood pressure should be. °WHAT SHOULD I DO BEFORE I CHECK MY BLOOD PRESSURE?  °Try to rest or relax for at least 30 minutes before you check your blood pressure. °Do not smoke. °Do not have any drinks with caffeine, such as: °Soda. °Coffee. °Tea. °Check your blood pressure in a quiet room. °Sit down and stretch out your arm on a table. Keep your arm at about the level of your heart. Let your arm relax. °Make sure that your legs are not crossed. °HOW DO I CHECK MY BLOOD PRESSURE? °Follow the directions that came with your machine. °Make sure you remove any tight-fighting clothing from your   arm or wrist. Wrap the cuff around your upper arm or wrist. You should be able to fit a finger between the cuff and your arm. If you cannot fit a finger between the cuff and your arm, it is too tight and should be removed and rewrapped. °Some units require you to manually pump up the arm cuff. °Automatic units inflate the cuff when you press a button. °Cuff deflation is automatic in both models. °After the cuff is inflated, the unit measures your blood pressure and pulse. The readings are shown on a monitor. Hold still and breathe normally while the cuff is inflated. °Getting a reading takes less than a minute. °Some models store readings in a memory. Some provide a printout of readings. If your machine does not store your readings, keep a written record. °Take readings with you to your next visit with your  doctor. °Document Released: 09/24/2008 Document Revised: 02/26/2014 Document Reviewed: 12/07/2013 °ExitCare® Patient Information ©2015 [employer]. This information is not intended to replace advice given to you by your health care provider. Make sure you discuss any questions you have with your health care provider. ° ° ° °
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
## 2017-09-14 NOTE — Telephone Encounter (Signed)
194/120 

## 2017-09-14 NOTE — Telephone Encounter (Signed)
Clare Gandy from [facility] a physical therapist called left message stating patient is running a high blood pressure. Please call Tom at [phone]

## 2017-09-14 NOTE — Telephone Encounter (Signed)
Rosalia Hammers RN Manger from [facility] called requesting to speak with [facility] regarding patient. [name] states they are having issues with the patient and Velna Hatchet has sent over new orders and at this point they have 3 doctors sending over orders with 3 different frequencies. [name] states she is unsure if they can manage patient because she is rude, yells at her staff and cusses them and hangs up. Please call [name] at [phone]

## 2017-09-14 NOTE — Patient Outreach (Signed)
[facility]) Care Management  09/14/2017  ELIZABELLE FITE 31-Mar-1966 [id number]  Noted Ms. Baade presentation to the ED after the home health nurse contacted Dr. Griffin Dakin office regarding symptomatic hypertension.   Our team has been working with Ms. Lockner in an effort to help with long term skilled nursing facility placement (see SW note 09/07/17). Ms. Span and her family have been working on required documentation for continuance of Medicaid. Ms. Esselman has been receiving frequent HHRN, [facility], and HHNA visits via [facility].   Plan: I will update Raynaldo Opitz LCSW re: Ms. Hinderliter hospital presentation and we will follow her progress closely. I will reach out to Ms. Burkman within 48 hours of hospital discharge should she admit.    [facility]  [phone]

## 2017-09-14 NOTE — Telephone Encounter (Signed)
Written order faxed.

## 2017-09-14 NOTE — Progress Notes (Signed)
SUTTON, PLAKE ([id number]) Visit Report for 09/10/2017 Allergy List Details Patient Name: [name], [name] Date of Service: 09/10/2017 12:30 PM Medical Record Number: [id number] Patient Account Number: [id number] Date of Birth/Sex: [birth year] (51 y.o. Female) Treating RN: Roger Shelter Primary Care Magaline Steinberg: Tula Nakayama Other Clinician: Referring Joren Rehm: Tula Nakayama Treating Larron Armor/Extender: Frann Rider in Treatment: 0 Allergies Active Allergies No Known Drug Allergies Allergy Notes Electronic Signature(s) Signed: 09/10/2017 3:24:39 PM By: Roger Shelter Entered By: Roger Shelter on 09/10/2017 12:50:01 [name] ([id number]) -------------------------------------------------------------------------------- Arrival Information Details Patient Name: [name] Date of Service: 09/10/2017 12:30 PM Medical Record Number: [id number] Patient Account Number: [id number] Date of Birth/Sex: 1966-10-20 (51 y.o. Female) Treating RN: Roger Shelter Primary Care Phi Avans: Tula Nakayama Other Clinician: Referring Hayle Parisi: Tula Nakayama Treating Giselle Brutus/Extender: Frann Rider in Treatment: 0 Visit Information Patient Arrived: Wheel Chair Arrival Time: 12:47 Accompanied By: sisters Transfer Assistance: None Patient Identification Verified: Yes Secondary Verification Process Completed: Yes Patient Requires Transmission-Based No Precautions: Patient Has Alerts: No Electronic Signature(s) Signed: 09/10/2017 3:24:39 PM By: Roger Shelter Entered By: Roger Shelter on 09/10/2017 12:47:26 [name] ([id number]) -------------------------------------------------------------------------------- Clinic Level of Care Assessment Details Patient Name: [name] Date of Service: 09/10/2017 12:30 PM Medical Record Number: [id number] Patient Account Number: [id number] Date of Birth/Sex: 17-Jul-1966 (51 y.o.  Female) Treating RN: Roger Shelter Primary Care Starlee Corralejo: Tula Nakayama Other Clinician: Referring Alexus Galka: Tula Nakayama Treating Livy Ross/Extender: Frann Rider in Treatment: 0 Clinic Level of Care Assessment Items TOOL 1 Quantity Score X - Use when EandM and Procedure is performed on INITIAL visit 1 0 ASSESSMENTS - Nursing Assessment / Reassessment X - General Physical Exam (combine w/ comprehensive assessment (listed just below) when 1 20 performed on new pt. evals) X- 1 25 Comprehensive Assessment (HX, ROS, Risk Assessments, Wounds Hx, etc.) ASSESSMENTS - Wound and Skin Assessment / Reassessment X - Dermatologic / Skin Assessment (not related to wound area) 1 10 ASSESSMENTS - Ostomy and/or Continence Assessment and Care []  - Incontinence Assessment and Management 0 []  - 0 Ostomy Care Assessment and Management (repouching, etc.) PROCESS - Coordination of Care []  - Simple Patient / Family Education for ongoing care 0 X- 1 20 Complex (extensive) Patient / Family Education for ongoing care X- 1 10 Staff obtains Programmer, systems, Records, Test Results / Process Orders []  - 0 Staff telephones HHA, Nursing Homes / Clarify orders / etc []  - 0 Routine Transfer to another Facility (non-emergent condition) []  - 0 Routine Hospital Admission (non-emergent condition) []  - 0 New Admissions / Biomedical engineer / Ordering NPWT, Apligraf, etc. []  - 0 Emergency Hospital Admission (emergent condition) PROCESS - Special Needs []  - Pediatric / Minor Patient Management 0 []  - 0 Isolation Patient Management []  - 0 Hearing / Language / Visual special needs []  - 0 Assessment of Community assistance (transportation, D/C planning, etc.) []  - 0 Additional assistance / Altered mentation []  - 0 Support Surface(s) Assessment (bed, cushion, seat, etc.) MIYO, AINA. ([id number]) INTERVENTIONS - Miscellaneous []  - External ear exam 0 []  - 0 Patient Transfer (multiple  staff / Civil Service fast streamer / Similar devices) []  - 0 Simple Staple / Suture removal (25 or less) X- 1 10 Complex Staple / Suture removal (26 or more) []  - 0 Hypo/Hyperglycemic Management (do not check if billed separately) []  - 0 Ankle / Brachial Index (ABI) - do not check if billed separately Has the patient been seen at the hospital within the last three years: Yes Total Score:  95 Level Of Care: New/Established - Level 3 Electronic Signature(s) Signed: 09/10/2017 3:24:39 PM By: Roger Shelter Entered By: Roger Shelter on 09/10/2017 13:51:29 [name] ([id number]) -------------------------------------------------------------------------------- Encounter Discharge Information Details Patient Name: [name] Date of Service: 09/10/2017 12:30 PM Medical Record Number: [id number] Patient Account Number: [id number] Date of Birth/Sex: 03/10/1966 (51 y.o. Female) Treating RN: Roger Shelter Primary Care Geovana Gebel: Tula Nakayama Other Clinician: Referring Katreena Schupp: Tula Nakayama Treating Jaunice Mirza/Extender: Frann Rider in Treatment: 0 Encounter Discharge Information Items Discharge Pain Level: 3 Discharge Condition: Stable Ambulatory Status: Wheelchair Discharge Destination: Home Transportation: Private Auto Accompanied By: sisters Schedule Follow-up Appointment: Yes Medication Reconciliation completed and No provided to Patient/Care Fonda Rochon: Provided on Clinical Summary of Care: 09/10/2017 Form Type Recipient Paper Patient BK Electronic Signature(s) Signed: 09/14/2017 8:35:27 AM By: Ruthine Dose Entered By: Ruthine Dose on 09/10/2017 13:55:30 [name] ([id number]) -------------------------------------------------------------------------------- Lower Extremity Assessment Details Patient Name: [name] Date of Service: 09/10/2017 12:30 PM Medical Record Number: [id number] Patient Account Number: [id number] Date of Birth/Sex:  Aug 16, 1966 (51 y.o. Female) Treating RN: Roger Shelter Primary Care Arantza Darrington: Tula Nakayama Other Clinician: Referring Karlos Scadden: Tula Nakayama Treating Haniyyah Sakuma/Extender: Frann Rider in Treatment: 0 Edema Assessment Assessed: [Left: No] [Right: No] Edema: [Left: Ye] [Right: s] Vascular Assessment Claudication: Claudication Assessment [Left:Intermittent] [Right:Intermittent] Pulses: Posterior Tibial Blood Pressure: Toe Nail Assessment Left: Right: Thick: Yes Yes Discolored: Yes Yes Deformed: Yes Yes Improper Length and Hygiene: Yes Yes Electronic Signature(s) Signed: 09/10/2017 3:24:39 PM By: Roger Shelter Entered By: Roger Shelter on 09/10/2017 13:48:12 [name] ([id number]) -------------------------------------------------------------------------------- Multi Wound Chart Details Patient Name: [name] Date of Service: 09/10/2017 12:30 PM Medical Record Number: [id number] Patient Account Number: [id number] Date of Birth/Sex: 11-14-1965 (51 y.o. Female) Treating RN: Cornell Barman Primary Care Caylan Chenard: Tula Nakayama Other Clinician: Referring Sheddrick Lattanzio: Tula Nakayama Treating Tzivia Oneil/Extender: Frann Rider in Treatment: 0 Vital Signs Height(in): 61 Pulse(bpm): 38 Weight(lbs): 130 Blood Pressure(mmHg): 161/93 Body Mass Index(BMI): 25 Temperature(F): 98.2 Respiratory Rate 18 (breaths/min): Photos: [1:No Photos] [2:No Photos] [3:No Photos] Wound Location: [1:Left Lower Leg - Proximal] [2:Left Lower Leg - Lateral, Distal Left Foot - Dorsal] Wounding Event: [1:Gradually Appeared] [2:Gradually Appeared] [3:Gradually Appeared] Primary Etiology: [1:Diabetic Wound/Ulcer of the Lower Extremity] [2:Diabetic Wound/Ulcer of the Diabetic Wound/Ulcer of the Lower Extremity] [3:Lower Extremity] Comorbid History: [1:Anemia, Chronic Obstructive Pulmonary Disease (COPD), Sleep Apnea, Arrhythmia, Congestive Heart Failure, Coronary  Artery Disease, Hypertension, Myocardial Infarction, Type II Diabetes, Rheumatoid Arthritis, Osteoarthritis,  Confinement Anxiety] [2:Anemia, Chronic Obstructive Anemia, Chronic Obstructive Pulmonary Disease (COPD), Pulmonary Disease (COPD), Sleep Apnea, Arrhythmia, Congestive Heart Failure, Coronary Artery Disease, Hypertension, Myocardial Infarction, Type II  Diabetes, Rheumatoid Arthritis, Osteoarthritis, Confinement Anxiety] [3:Sleep Apnea, Arrhythmia, Congestive Heart Failure, Coronary Artery Disease, Hypertension, Myocardial Infarction, Type II Diabetes, Rheumatoid Arthritis, Osteoarthritis, Confinement  Anxiety] Date Acquired: [1:06/10/2017] [2:06/10/2017] [3:06/10/2017] Weeks of Treatment: [1:0] [2:0] [3:0] Wound Status: [1:Open] [2:Open] [3:Open] Clustered Wound: [1:No] [2:No] [3:No] Clustered Quantity: [1:N/A] [2:N/A] [3:N/A] Measurements L x W x D [1:4.5x4.5x0.5] [2:0.5x0.9x0.2] [3:5.5x4.5x0.3] (cm) Area (cm) : [1:15.904] [2:0.353] [3:19.439] Volume (cm) : [1:7.952] [2:0.071] [3:5.832] Classification: [1:Grade 2] [2:Grade 2] [3:Grade 2] Exudate Amount: [1:Medium] [2:Medium] [3:Medium] Exudate Type: [1:Purulent] [2:Sanguinous] [3:Purulent] Exudate Color: [1:yellow, brown, green] [2:red] [3:yellow, brown, green] Foul Odor After Cleansing: [1:Yes] [2:No] [3:No] Odor Anticipated Due to [1:No] [2:N/A] [3:N/A] Product Use: Wound Margin: [1:Flat and Intact] [2:Flat and Intact] [3:Flat and Intact] Granulation Amount: [1:None Present (0%)] [2:None Present (0%)] [3:Small (1-33%)] Granulation Quality: [1:N/A] [2:N/A] [3:N/A] Necrotic Amount: [1:Large (  67-100%)] [2:Large (67-100%)] [3:Large (67-100%)] Necrotic Tissue: [1:Eschar, Adherent Slough] [2:Eschar, Adherent Slough] [3:Eschar, Adherent Slough] Exposed Structures: [3:N/A] Fat Layer (Subcutaneous Fascia: No Tissue) Exposed: Yes Fat Layer (Subcutaneous Fascia: No Tissue) Exposed: No Tendon: No Tendon: No Muscle: No Muscle:  No Joint: No Joint: No Bone: No Bone: No Epithelialization: None None None Periwound Skin Texture: Scarring: Yes Excoriation: No Scarring: Yes Induration: No Excoriation: No Callus: No Induration: No Crepitus: No Callus: No Rash: No Crepitus: No Scarring: No Rash: No Periwound Skin Moisture: No Abnormalities Noted Maceration: No Dry/Scaly: Yes Dry/Scaly: No Maceration: No Periwound Skin Color: Ecchymosis: Yes Hemosiderin Staining: Yes Ecchymosis: Yes Atrophie Blanche: No Hemosiderin Staining: Yes Cyanosis: No Atrophie Blanche: No Ecchymosis: No Cyanosis: No Erythema: No Erythema: No Mottled: No Mottled: No Pallor: No Pallor: No Rubor: No Rubor: No Temperature: No Abnormality N/A N/A Tenderness on Palpation: Yes No No Wound Preparation: Ulcer Cleansing: Ulcer Cleansing: Ulcer Cleansing: Rinsed/Irrigated with Saline Rinsed/Irrigated with Saline Rinsed/Irrigated with Saline Topical Anesthetic Applied: Topical Anesthetic Applied: Topical Anesthetic Applied: Other: lidocaine 4% Other: lidocaine 4% Other: lidocaine 4% Wound Number: [id number]  Photos: No Photos No Photos No Photos Wound Location: Right Lower Leg - Anterior Right Foot - Dorsal Left Lower Leg - Posterior Wounding Event: Gradually Appeared Gradually Appeared Gradually Appeared Primary Etiology: Diabetic Wound/Ulcer of the Diabetic Wound/Ulcer of the Diabetic Wound/Ulcer of the Lower Extremity Lower Extremity Lower Extremity Comorbid History: Anemia, Chronic Obstructive Anemia, Chronic Obstructive Anemia, Chronic Obstructive Pulmonary Disease (COPD), Pulmonary Disease (COPD), Pulmonary Disease (COPD), Sleep Apnea, Arrhythmia, Sleep Apnea, Arrhythmia, Sleep Apnea, Arrhythmia, Congestive Heart Failure, Congestive Heart Failure, Congestive Heart Failure, Coronary Artery Disease, Coronary Artery Disease, Coronary Artery Disease, Hypertension, Myocardial Hypertension, Myocardial Hypertension,  Myocardial Infarction, Type II Diabetes, Infarction, Type II Diabetes, Infarction, Type II Diabetes, Rheumatoid Arthritis, Rheumatoid Arthritis, Rheumatoid Arthritis, Osteoarthritis, Confinement Osteoarthritis, Confinement Osteoarthritis, Confinement Anxiety Anxiety Anxiety Date Acquired: 06/10/2017 06/10/2017 06/10/2017 Weeks of Treatment: 0 0 0 Wound Status: Open Open Open Clustered Wound: No No Yes Clustered Quantity: N/A N/A 4 Measurements L x W x D 0.7x0.8x0.1 7.5x5.2x0.5 12x7x0.3 (cm) Area (cm) : 0.44 30.631 65.973 Volume (cm) : 0.044 15.315 19.792 Classification: Grade 2 Grade 2 Grade 2 Noyes, Sidra H. ([id number]) Exudate Amount: None Present Medium Small Exudate Type: N/A Serosanguineous Serosanguineous Exudate Color: N/A red, brown red, brown Foul Odor After Cleansing: No Yes No Odor Anticipated Due to N/A No N/A Product Use: Wound Margin: Flat and Intact Flat and Intact Flat and Intact Granulation Amount: None Present (0%) None Present (0%) Small (1-33%) Granulation Quality: N/A N/A Red Necrotic Amount: Large (67-100%) Large (67-100%) Large (67-100%) Necrotic Tissue: Eschar N/A Eschar, Adherent Slough Exposed Structures: Fascia: No N/A Fascia: No Fat Layer (Subcutaneous Fat Layer (Subcutaneous Tissue) Exposed: No Tissue) Exposed: No Tendon: No Tendon: No Muscle: No Muscle: No Joint: No Joint: No Bone: No Bone: No Epithelialization: N/A None None Periwound Skin Texture: Excoriation: No Scarring: Yes Induration: Yes Induration: No Callus: No Crepitus: No Rash: No Scarring: No Periwound Skin Moisture: Maceration: No No Abnormalities Noted No Abnormalities Noted Dry/Scaly: No Periwound Skin Color: Hemosiderin Staining: Yes No Abnormalities Noted Ecchymosis: Yes Atrophie Blanche: No Hemosiderin Staining: Yes Cyanosis: No Ecchymosis: No Erythema: No Mottled: No Pallor: No Rubor: No Temperature: N/A N/A N/A Tenderness on Palpation: No No  No Wound Preparation: Ulcer Cleansing: Ulcer Cleansing: Ulcer Cleansing: Rinsed/Irrigated with Saline Rinsed/Irrigated with Saline Rinsed/Irrigated with Saline Topical Anesthetic Applied: Topical Anesthetic Applied: Topical Anesthetic Applied: Other: lidocaine 4% Other: lidocaine 4%  Other: lidocaine 4% Wound Number: 7 N/A N/A Photos: No Photos N/A N/A Wound Location: Lower Leg N/A N/A Wounding Event: Gradually Appeared N/A N/A Primary Etiology: Diabetic Wound/Ulcer of the N/A N/A Lower Extremity Comorbid History: Anemia, Chronic Obstructive N/A N/A Pulmonary Disease (COPD), Sleep Apnea, Arrhythmia, Congestive Heart Failure, Coronary Artery Disease, Hypertension, Myocardial Infarction, Type II Diabetes, Rheumatoid Arthritis, CHRISANDRA, WIEMERS ([id number]) Osteoarthritis, Confinement Anxiety Date Acquired: 06/10/2017 N/A N/A Weeks of Treatment: 0 N/A N/A Wound Status: Open N/A N/A Clustered Wound: No N/A N/A Clustered Quantity: N/A N/A N/A Measurements L x W x D 3.8x3x0.8 N/A N/A (cm) Area (cm) : 8.954 N/A N/A Volume (cm) : 7.163 N/A N/A Classification: Grade 2 N/A N/A Exudate Amount: Large N/A N/A Exudate Type: Serosanguineous N/A N/A Exudate Color: red, brown N/A N/A Foul Odor After Cleansing: No N/A N/A Odor Anticipated Due to N/A N/A N/A Product Use: Wound Margin: Flat and Intact N/A N/A Granulation Amount: None Present (0%) N/A N/A Granulation Quality: N/A N/A N/A Necrotic Amount: Large (67-100%) N/A N/A Necrotic Tissue: Eschar, Adherent Slough N/A N/A Exposed Structures: Fat Layer (Subcutaneous N/A N/A Tissue) Exposed: Yes Fascia: No Tendon: No Muscle: No Joint: No Bone: No Epithelialization: None N/A N/A Periwound Skin Texture: No Abnormalities Noted N/A N/A Periwound Skin Moisture: No Abnormalities Noted N/A N/A Periwound Skin Color: Ecchymosis: Yes N/A N/A Hemosiderin Staining: Yes Temperature: N/A N/A N/A Tenderness on Palpation: No N/A  N/A Wound Preparation: Ulcer Cleansing: N/A N/A Rinsed/Irrigated with Saline Topical Anesthetic Applied: Other: lidocaine 4% Treatment Notes Electronic Signature(s) Signed: 09/10/2017 1:50:22 PM By: Christin Fudge MD, FACS Entered By: Christin Fudge on 09/10/2017 13:50:22 [name] ([id number]) -------------------------------------------------------------------------------- Pain Assessment Details Patient Name: [name] Date of Service: 09/10/2017 12:30 PM Medical Record Number: [id number] Patient Account Number: [id number] Date of Birth/Sex: 26-Sep-1966 (51 y.o. Female) Treating RN: Roger Shelter Primary Care Abbye Lao: Tula Nakayama Other Clinician: Referring Navjot Pilgrim: Tula Nakayama Treating Kyran Connaughton/Extender: Frann Rider in Treatment: 0 Active Problems Location of Pain Severity and Description of Pain Patient Has Paino Yes Site Locations Pain Location: Pain in Ulcers Duration of the Pain. Constant / Intermittento Constant Rate the pain. Current Pain Level: 9 Character of Pain Describe the Pain: Stabbing, Throbbing Pain Management and Medication Current Pain Management: Electronic Signature(s) Signed: 09/10/2017 3:24:39 PM By: Roger Shelter Entered By: Roger Shelter on 09/10/2017 12:48:10 [name] ([id number]) -------------------------------------------------------------------------------- Patient/Caregiver Education Details Patient Name: [name] Date of Service: 09/10/2017 12:30 PM Medical Record Number: [id number] Patient Account Number: [id number] Date of Birth/Gender: 10-07-1966 (51 y.o. Female) Treating RN: Roger Shelter Primary Care Physician: Tula Nakayama Other Clinician: Referring Physician: Tula Nakayama Treating Physician/Extender: Frann Rider in Treatment: 0 Education Assessment Education Provided To: Patient and Caregiver Education Topics Provided Smoking and Wound  Healing: Handouts: Smoking and Wound Healing Methods: Explain/Verbal Responses: State content correctly [place]: Handouts: Welcome [place] Methods: Explain/Verbal Responses: State content correctly Wound Debridement: Handouts: Wound Debridement Methods: Explain/Verbal Responses: State content correctly Wound/Skin Impairment: Handouts: Caring for Your Ulcer Methods: Explain/Verbal Responses: State content correctly Notes to follow up with Dr. Gayla Doss Electronic Signature(s) Signed: 09/10/2017 3:24:39 PM By: Roger Shelter Entered By: Roger Shelter on 09/10/2017 13:56:08 [name] ([id number]) -------------------------------------------------------------------------------- Wound Assessment Details Patient Name: [name] Date of Service: 09/10/2017 12:30 PM Medical Record Number: [id number] Patient Account Number: [id number] Date of Birth/Sex: 1966/09/05 (51 y.o. Female) Treating RN: Roger Shelter Primary Care Amoree Newlon: Tula Nakayama Other  Clinician: Referring Vonte Rossin: Tula Nakayama Treating Rashon Rezek/Extender: Frann Rider in Treatment: 0 Wound Status Wound Number: 1 Primary Diabetic Wound/Ulcer of the Lower Extremity Etiology: Wound Location: Left Lower Leg - Proximal Wound Open Wounding Event: Gradually Appeared Status: Date Acquired: 06/10/2017 Comorbid Anemia, Chronic Obstructive Pulmonary Disease Weeks Of Treatment: 0 History: (COPD), Sleep Apnea, Arrhythmia, Congestive Clustered Wound: No Heart Failure, Coronary Artery Disease, Hypertension, Myocardial Infarction, Type II Diabetes, Rheumatoid Arthritis, Osteoarthritis, Confinement Anxiety Photos Photo Uploaded By: Roger Shelter on 09/10/2017 14:09:53 Wound Measurements Length: (cm) 4.5 Width: (cm) 4.5 Depth: (cm) 0.5 Area: (cm) 15.904 Volume: (cm) 7.952 % Reduction in Area: % Reduction in Volume: Epithelialization:  None Tunneling: No Undermining: No Wound Description Classification: Grade 2 Wound Margin: Flat and Intact Exudate Amount: Medium Exudate Type: Purulent Exudate Color: yellow, brown, green Foul Odor After Cleansing: Yes Due to Product Use: No Slough/Fibrino Yes Wound Bed Granulation Amount: None Present (0%) Exposed Structure Necrotic Amount: Large (67-100%) Fascia Exposed: No Necrotic Quality: Eschar, Adherent Slough Fat Layer (Subcutaneous Tissue) Exposed: Yes Tendon Exposed: No Muscle Exposed: No Joint Exposed: No Bone Exposed: No Periwound Skin Texture Bifulco, Consepcion H. ([id number]) Texture Color No Abnormalities Noted: No No Abnormalities Noted: No Scarring: Yes Ecchymosis: Yes Moisture Temperature / Pain No Abnormalities Noted: No Temperature: No Abnormality Tenderness on Palpation: Yes Wound Preparation Ulcer Cleansing: Rinsed/Irrigated with Saline Topical Anesthetic Applied: Other: lidocaine 4%, Treatment Notes Wound #1 (Left, Proximal Lower Leg) 1. Cleansed with: Clean wound with Normal Saline 2. Anesthetic Topical Lidocaine 4% cream to wound bed prior to debridement 4. Dressing Applied: Santyl Ointment Notes telfa pads on open wounds and wrap lower legs with gauze dressing Electronic Signature(s) Signed: 09/10/2017 3:24:39 PM By: Roger Shelter Entered By: Roger Shelter on 09/10/2017 13:15:52 [name] ([id number]) -------------------------------------------------------------------------------- Wound Assessment Details Patient Name: [name] Date of Service: 09/10/2017 12:30 PM Medical Record Number: [id number] Patient Account Number: [id number] Date of Birth/Sex: 11/19/1965 (51 y.o. Female) Treating RN: Roger Shelter Primary Care Kenzey Birkland: Tula Nakayama Other Clinician: Referring Makenze Ellett: Tula Nakayama Treating Havyn Ramo/Extender: Frann Rider in Treatment: 0 Wound Status Wound Number: 2 Primary Diabetic  Wound/Ulcer of the Lower Extremity Etiology: Wound Location: Left Lower Leg - Lateral, Distal Wound Open Wounding Event: Gradually Appeared Status: Date Acquired: 06/10/2017 Comorbid Anemia, Chronic Obstructive Pulmonary Disease Weeks Of Treatment: 0 History: (COPD), Sleep Apnea, Arrhythmia, Congestive Clustered Wound: No Heart Failure, Coronary Artery Disease, Hypertension, Myocardial Infarction, Type II Diabetes, Rheumatoid Arthritis, Osteoarthritis, Confinement Anxiety Photos Photo Uploaded By: Roger Shelter on 09/10/2017 14:10:21 Wound Measurements Length: (cm) 0.5 Width: (cm) 0.9 Depth: (cm) 0.2 Area: (cm) 0.353 Volume: (cm) 0.071 % Reduction in Area: % Reduction in Volume: Epithelialization: None Tunneling: No Undermining: No Wound Description Classification: Grade 2 Wound Margin: Flat and Intact Exudate Amount: Medium Exudate Type: Sanguinous Exudate Color: red Foul Odor After Cleansing: No Slough/Fibrino Yes Wound Bed Granulation Amount: None Present (0%) Exposed Structure Necrotic Amount: Large (67-100%) Fascia Exposed: No Necrotic Quality: Eschar, Adherent Slough Fat Layer (Subcutaneous Tissue) Exposed: No Tendon Exposed: No Muscle Exposed: No Joint Exposed: No Bone Exposed: No Periwound Skin Texture Dupree, Salaya H. ([id number]) Texture Color No Abnormalities Noted: No No Abnormalities Noted: No Callus: No Atrophie Blanche: No Crepitus: No Cyanosis: No Excoriation: No Ecchymosis: No Induration: No Erythema: No Rash: No Hemosiderin Staining: Yes Scarring: No Mottled: No Pallor: No Moisture Rubor: No No Abnormalities Noted: No Dry / Scaly: No Maceration: No Wound Preparation Ulcer Cleansing: Rinsed/Irrigated with Saline Topical Anesthetic Applied: Other:  lidocaine 4%, Treatment Notes Wound #2 (Left, Distal, Lateral Lower Leg) 1. Cleansed with: Clean wound with Normal Saline 2. Anesthetic Topical Lidocaine 4% cream to wound  bed prior to debridement 4. Dressing Applied: Santyl Ointment Notes telfa pads on open wounds and wrap lower legs with gauze dressing Electronic Signature(s) Signed: 09/10/2017 3:24:39 PM By: Roger Shelter Entered By: Roger Shelter on 09/10/2017 13:17:35 [name] ([id number]) -------------------------------------------------------------------------------- Wound Assessment Details Patient Name: [name] Date of Service: 09/10/2017 12:30 PM Medical Record Number: [id number] Patient Account Number: [id number] Date of Birth/Sex: 04-07-1966 (51 y.o. Female) Treating RN: Roger Shelter Primary Care Koury Roddy: Tula Nakayama Other Clinician: Referring Oliviagrace Crisanti: Tula Nakayama Treating Daeshawn Redmann/Extender: Frann Rider in Treatment: 0 Wound Status Wound Number: 3 Primary Diabetic Wound/Ulcer of the Lower Extremity Etiology: Wound Location: Left Foot - Dorsal Wound Open Wounding Event: Gradually Appeared Status: Date Acquired: 06/10/2017 Comorbid Anemia, Chronic Obstructive Pulmonary Disease Weeks Of Treatment: 0 History: (COPD), Sleep Apnea, Arrhythmia, Congestive Clustered Wound: No Heart Failure, Coronary Artery Disease, Hypertension, Myocardial Infarction, Type II Diabetes, Rheumatoid Arthritis, Osteoarthritis, Confinement Anxiety Photos Photo Uploaded By: Roger Shelter on 09/10/2017 14:10:21 Wound Measurements Length: (cm) 5.5 Width: (cm) 4.5 Depth: (cm) 0.3 Area: (cm) 19.439 Volume: (cm) 5.832 % Reduction in Area: % Reduction in Volume: Epithelialization: None Tunneling: No Undermining: No Wound Description Classification: Grade 2 Wound Margin: Flat and Intact Exudate Amount: Medium Exudate Type: Purulent Exudate Color: yellow, brown, green Foul Odor After Cleansing: No Slough/Fibrino Yes Wound Bed Granulation Amount: Small (1-33%) Necrotic Amount: Large (67-100%) Necrotic Quality: Eschar, Adherent Slough Periwound  Skin Texture Texture Color No Abnormalities Noted: No No Abnormalities Noted: No Callus: No Atrophie Blanche: No Crepitus: No Cyanosis: No DORYCE, MCGREGORY. ([id number]) Excoriation: No Ecchymosis: Yes Induration: No Erythema: No Rash: No Hemosiderin Staining: Yes Scarring: Yes Mottled: No Pallor: No Moisture Rubor: No No Abnormalities Noted: No Dry / Scaly: Yes Maceration: No Wound Preparation Ulcer Cleansing: Rinsed/Irrigated with Saline Topical Anesthetic Applied: Other: lidocaine 4%, Treatment Notes Wound #3 (Left, Dorsal Foot) 1. Cleansed with: Clean wound with Normal Saline 2. Anesthetic Topical Lidocaine 4% cream to wound bed prior to debridement 4. Dressing Applied: Santyl Ointment Notes telfa pads on open wounds and wrap lower legs with gauze dressing Electronic Signature(s) Signed: 09/10/2017 3:24:39 PM By: Roger Shelter Entered By: Roger Shelter on 09/10/2017 13:19:26 [name] ([id number]) -------------------------------------------------------------------------------- Wound Assessment Details Patient Name: [name] Date of Service: 09/10/2017 12:30 PM Medical Record Number: [id number] Patient Account Number: [id number] Date of Birth/Sex: [birth year] (51 y.o. Female) Treating RN: Roger Shelter Primary Care Kirkland Figg: Tula Nakayama Other Clinician: Referring Sopheap Basic: Tula Nakayama Treating Tramain Gershman/Extender: Frann Rider in Treatment: 0 Wound Status Wound Number: 4 Primary Diabetic Wound/Ulcer of the Lower Extremity Etiology: Wound Location: Right Lower Leg - Anterior Wound Open Wounding Event: Gradually Appeared Status: Date Acquired: 06/10/2017 Comorbid Anemia, Chronic Obstructive Pulmonary Disease Weeks Of Treatment: 0 History: (COPD), Sleep Apnea, Arrhythmia, Congestive Clustered Wound: No Heart Failure, Coronary Artery Disease, Hypertension, Myocardial Infarction, Type II Diabetes, Rheumatoid  Arthritis, Osteoarthritis, Confinement Anxiety Photos Photo Uploaded By: Roger Shelter on 09/10/2017 14:10:52 Wound Measurements Length: (cm) Width: (cm) Depth: (cm) Area: (cm) Volume: (cm) 0.7 % Reduction in Area: 0.8 % Reduction in Volume: 0.1 0.44 0.044 Wound Description Classification: Grade 2 Wound Margin: Flat and Intact Exudate Amount: None Present Foul Odor After Cleansing: No Slough/Fibrino No Wound Bed Granulation Amount: None Present (0%) Exposed Structure Necrotic Amount: Large (67-100%) Fascia Exposed: No Necrotic Quality: Eschar  Fat Layer (Subcutaneous Tissue) Exposed: No Tendon Exposed: No Muscle Exposed: No Joint Exposed: No Bone Exposed: No Periwound Skin Texture Texture Color No Abnormalities Noted: No No Abnormalities Noted: No LAVELLA, MYREN. ([id number]) Callus: No Atrophie Blanche: No Crepitus: No Cyanosis: No Excoriation: No Ecchymosis: No Induration: No Erythema: No Rash: No Hemosiderin Staining: Yes Scarring: No Mottled: No Pallor: No Moisture Rubor: No No Abnormalities Noted: No Dry / Scaly: No Maceration: No Wound Preparation Ulcer Cleansing: Rinsed/Irrigated with Saline Topical Anesthetic Applied: Other: lidocaine 4%, Treatment Notes Wound #4 (Right, Anterior Lower Leg) 1. Cleansed with: Clean wound with Normal Saline 2. Anesthetic Topical Lidocaine 4% cream to wound bed prior to debridement 4. Dressing Applied: Santyl Ointment Notes telfa pads on open wounds and wrap lower legs with gauze dressing Electronic Signature(s) Signed: 09/10/2017 3:24:39 PM By: Roger Shelter Entered By: Roger Shelter on 09/10/2017 13:21:00 [name] ([id number]) -------------------------------------------------------------------------------- Wound Assessment Details Patient Name: [name] Date of Service: 09/10/2017 12:30 PM Medical Record Number: [id number] Patient Account Number: [id number] Date of  Birth/Sex: 1965-12-28 (51 y.o. Female) Treating RN: Roger Shelter Primary Care Siddalee Vanderheiden: Tula Nakayama Other Clinician: Referring Deonna Krummel: Tula Nakayama Treating Damaso Laday/Extender: Frann Rider in Treatment: 0 Wound Status Wound Number: 5 Primary Diabetic Wound/Ulcer of the Lower Extremity Etiology: Wound Location: Right Foot - Dorsal Wound Open Wounding Event: Gradually Appeared Status: Date Acquired: 06/10/2017 Comorbid Anemia, Chronic Obstructive Pulmonary Disease Weeks Of Treatment: 0 History: (COPD), Sleep Apnea, Arrhythmia, Congestive Clustered Wound: No Heart Failure, Coronary Artery Disease, Hypertension, Myocardial Infarction, Type II Diabetes, Rheumatoid Arthritis, Osteoarthritis, Confinement Anxiety Photos Photo Uploaded By: Roger Shelter on 09/10/2017 14:10:52 Wound Measurements Length: (cm) 7.5 Width: (cm) 5.2 Depth: (cm) 0.5 Area: (cm) 30.631 Volume: (cm) 15.315 % Reduction in Area: % Reduction in Volume: Epithelialization: None Tunneling: No Undermining: No Wound Description Classification: Grade 2 Wound Margin: Flat and Intact Exudate Amount: Medium Exudate Type: Serosanguineous Exudate Color: red, brown Foul Odor After Cleansing: Yes Due to Product Use: No Slough/Fibrino Yes Wound Bed Granulation Amount: None Present (0%) Necrotic Amount: Large (67-100%) Periwound Skin Texture Texture Color No Abnormalities Noted: No No Abnormalities Noted: No Scarring: Yes Moisture No Abnormalities Noted: No NAHLIA, HELLMANN. ([id number]) Wound Preparation Ulcer Cleansing: Rinsed/Irrigated with Saline Topical Anesthetic Applied: Other: lidocaine 4%, Treatment Notes Wound #5 (Right, Dorsal Foot) 1. Cleansed with: Clean wound with Normal Saline 2. Anesthetic Topical Lidocaine 4% cream to wound bed prior to debridement 4. Dressing Applied: Santyl Ointment Notes telfa pads on open wounds and wrap lower legs with gauze  dressing Electronic Signature(s) Signed: 09/10/2017 3:24:39 PM By: Roger Shelter Entered By: Roger Shelter on 09/10/2017 13:22:23 [name] ([id number]) -------------------------------------------------------------------------------- Wound Assessment Details Patient Name: [name] Date of Service: 09/10/2017 12:30 PM Medical Record Number: [id number] Patient Account Number: [id number] Date of Birth/Sex: 30-Jan-1966 (51 y.o. Female) Treating RN: Roger Shelter Primary Care Ladana Chavero: Tula Nakayama Other Clinician: Referring Rolando Hessling: Tula Nakayama Treating Laquinta Hazell/Extender: Frann Rider in Treatment: 0 Wound Status Wound Number: 6 Primary Diabetic Wound/Ulcer of the Lower Extremity Etiology: Wound Location: Left Lower Leg - Posterior Wound Open Wounding Event: Gradually Appeared Status: Date Acquired: 06/10/2017 Comorbid Anemia, Chronic Obstructive Pulmonary Disease Weeks Of Treatment: 0 History: (COPD), Sleep Apnea, Arrhythmia, Congestive Clustered Wound: Yes Heart Failure, Coronary Artery Disease, Hypertension, Myocardial Infarction, Type II Diabetes, Rheumatoid Arthritis, Osteoarthritis, Confinement Anxiety Photos Photo Uploaded By: Roger Shelter on 09/10/2017 14:11:24 Wound Measurements Length: (cm) 12 Width: (cm) 7 Depth: (cm) 0.3 Clustered Quantity: 4  Area: (cm) 65.973 Volume: (cm) 19.792 % Reduction in Area: % Reduction in Volume: Epithelialization: None Tunneling: No Undermining: No Wound Description Classification: Grade 2 Wound Margin: Flat and Intact Exudate Amount: Small Exudate Type: Serosanguineous Exudate Color: red, brown Foul Odor After Cleansing: No Slough/Fibrino Yes Wound Bed Granulation Amount: Small (1-33%) Exposed Structure Granulation Quality: Red Fascia Exposed: No Necrotic Amount: Large (67-100%) Fat Layer (Subcutaneous Tissue) Exposed: No Necrotic Quality: Eschar, Adherent Slough Tendon  Exposed: No Muscle Exposed: No Joint Exposed: No Bone Exposed: No Love, Chowning Kaedance H. ([id number]) Periwound Skin Texture Texture Color No Abnormalities Noted: No No Abnormalities Noted: No Induration: Yes Ecchymosis: Yes Hemosiderin Staining: Yes Moisture No Abnormalities Noted: No Wound Preparation Ulcer Cleansing: Rinsed/Irrigated with Saline Topical Anesthetic Applied: Other: lidocaine 4%, Treatment Notes Wound #6 (Left, Posterior Lower Leg) 1. Cleansed with: Clean wound with Normal Saline 2. Anesthetic Topical Lidocaine 4% cream to wound bed prior to debridement 4. Dressing Applied: Santyl Ointment Notes telfa pads on open wounds and wrap lower legs with gauze dressing Electronic Signature(s) Signed: 09/10/2017 3:24:39 PM By: Roger Shelter Entered By: Roger Shelter on 09/10/2017 13:24:09 [name] ([id number]) -------------------------------------------------------------------------------- Wound Assessment Details Patient Name: [name] Date of Service: 09/10/2017 12:30 PM Medical Record Number: [id number] Patient Account Number: [id number] Date of Birth/Sex: 1965-12-18 (51 y.o. Female) Treating RN: Roger Shelter Primary Care Krishav Mamone: Tula Nakayama Other Clinician: Referring Carlise Stofer: Tula Nakayama Treating Viviann Broyles/Extender: Frann Rider in Treatment: 0 Wound Status Wound Number: 7 Primary Diabetic Wound/Ulcer of the Lower Extremity Etiology: Wound Location: Lower Leg Wound Open Wounding Event: Gradually Appeared Status: Date Acquired: 06/10/2017 Comorbid Anemia, Chronic Obstructive Pulmonary Disease Weeks Of Treatment: 0 History: (COPD), Sleep Apnea, Arrhythmia, Congestive Clustered Wound: No Heart Failure, Coronary Artery Disease, Hypertension, Myocardial Infarction, Type II Diabetes, Rheumatoid Arthritis, Osteoarthritis, Confinement Anxiety Photos Photo Uploaded By: Roger Shelter on 09/10/2017  14:11:24 Wound Measurements Length: (cm) 3.8 Width: (cm) 3 Depth: (cm) 0.8 Area: (cm) 8.954 Volume: (cm) 7.163 % Reduction in Area: % Reduction in Volume: Epithelialization: None Tunneling: No Undermining: No Wound Description Classification: Grade 2 Wound Margin: Flat and Intact Exudate Amount: Large Exudate Type: Serosanguineous Exudate Color: red, brown Foul Odor After Cleansing: No Slough/Fibrino Yes Wound Bed Granulation Amount: None Present (0%) Exposed Structure Necrotic Amount: Large (67-100%) Fascia Exposed: No Necrotic Quality: Eschar, Adherent Slough Fat Layer (Subcutaneous Tissue) Exposed: Yes Tendon Exposed: No Muscle Exposed: No Joint Exposed: No Bone Exposed: No Periwound Skin Texture Hopping, Kaleeyah H. ([id number]) Texture Color No Abnormalities Noted: No No Abnormalities Noted: No Ecchymosis: Yes Moisture Hemosiderin Staining: Yes No Abnormalities Noted: No Wound Preparation Ulcer Cleansing: Rinsed/Irrigated with Saline Topical Anesthetic Applied: Other: lidocaine 4%, Treatment Notes Wound #7 (Lower Leg) 1. Cleansed with: Clean wound with Normal Saline 2. Anesthetic Topical Lidocaine 4% cream to wound bed prior to debridement 4. Dressing Applied: Santyl Ointment Notes telfa pads on open wounds and wrap lower legs with gauze dressing Electronic Signature(s) Signed: 09/10/2017 3:24:39 PM By: Roger Shelter Entered By: Roger Shelter on 09/10/2017 13:25:43 [name] ([id number]) -------------------------------------------------------------------------------- Vitals Details Patient Name: [name] Date of Service: 09/10/2017 12:30 PM Medical Record Number: [id number] Patient Account Number: [id number] Date of Birth/Sex: 1966/06/25 (51 y.o. Female) Treating RN: Roger Shelter Primary Care Legend Pecore: Tula Nakayama Other Clinician: Referring Kamyah Wilhelmsen: Tula Nakayama Treating Tala Eber/Extender: Frann Rider in Treatment: 0 Vital Signs Time Taken: 12:48 Temperature (F): 98.2 Height (in): 61 Pulse (bpm): 74 Source: Stated Respiratory Rate (breaths/min): 18 Weight (lbs): 130 Blood  Pressure (mmHg): 161/93 Source: Stated Reference Range: 80 - 120 mg / dl Body Mass Index (BMI): 24.6 Electronic Signature(s) Signed: 09/10/2017 3:24:39 PM By: Roger Shelter Entered By: Roger Shelter on 09/10/2017 12:49:38

## 2017-09-15 ENCOUNTER — Telehealth: Payer: Self-pay | Admitting: Family Medicine

## 2017-09-15 ENCOUNTER — Other Ambulatory Visit: Payer: Self-pay | Admitting: *Deleted

## 2017-09-15 NOTE — Telephone Encounter (Signed)
Called tom bass and left a message that if her BP was 192/120 she needed to go to the ER

## 2017-09-15 NOTE — Telephone Encounter (Signed)
Mallory Parcell, occupational therapist with [facility], left message on nurse line regarding patient. She states that she has been waiting on a call back with verbal orders for patient since last Friday. She states she needs orders to see patient once a week for 2 weeks, then twice a week for 2 weeks. She states she has not been back out to see patient and can not go back out until the verbal orders are given.  Callback# [phone]   Clare Gandy, physical therapy assistant with [facility], also called and left message on nurse line regarding patient. He states he was out to see patient and she had a resting BP of 192/120. He states she was lying in bed and did not appear to be in any distress, but she was complaining of 10/10 bilateral lower leg pain.  Callback# [phone]

## 2017-09-15 NOTE — Telephone Encounter (Signed)
Called and left message.

## 2017-09-15 NOTE — Telephone Encounter (Signed)
Verbal order given for OT to go back out and see her

## 2017-09-15 NOTE — Patient Outreach (Addendum)
[name] [facility]) Care Management  09/15/2017  [name] Jan 12, 1966 [id number]  [name] was referred to [facility] for nursing services after her discharge from [facility] on [facility]/17. She is also currently being followed by our social work team.     [name] is a 51 year old female patient living in [place] and has a medical history which includes bilateral lower extremity cellulitis, DM type 2 - uncontrolled, chronic systolic heart failure, tachyarrythmia, essential hypertension, CVA, CKD, hyperlipidemia, anxiety and depression, and history of polysubstance abuse.   Acute/Chronic Health Condition (Hypertension) - [name] presented to the ED yesterday after her home health nurse measured and found her bp to be 194/120 and [name] demonstrated positional vertigo. [name] had NOT taken her antihypertensives yesterday. She was evaluated in the ED with the provider indicating no focal neuro deficits, low suspicion for central vertigo, and no evidence for end-organ damage. She was evaluated, [name] meclizine and hydralazine and discharged to home.   Today, [name] reports that she has not yet taken her prescription for Antivert to the drug store. Her sister is to take it for her today.    Skin Care Needs-  [name] is being seen at the [place] [facility] (last appointment 11/16 at 12:30 with Dr. Christin Fudge). THN CM [name] [name] contacted [facility] through patient's insurance [facility]) at [phone] and made arrangements for transportation. [name] says she saw Dr. Sharol [name] on 09/13/17 and he told her that he planned to take her to the OR the week after Thanksgiving to "clean out the wounds". Indeed, Dr. Jess [name] visit notes indicate that Ms. [name] has atherosclerosis of native arteries of extremities with gangrene, bilateral legs.   Dr. Jess [name] plan includes: "....debride ulcerations on  both legs and both feet apply a ver flow wound VAC with installation therapy anticipate approximatly 1 week of instillation therapy and application of skin graft and topical wound VAC dressings..." Dr. Sharol [name] also reviewed with Ms. [name] that she must stop smoking completely or it is likely that she will need a transtibial amputation. He recommended smoking cessation and a diet rich in greens, beans, fruits, and nuts. She was [name] a prescription for doxycycline and told to anticipate surgery on 09/22/17.   The medical record indicates the following upcoming appointments related to her wounds/wound care:   09/24/17 Dr. Ashley [name] 10:15am  ([facility]) 10/05/17 [name] 10:45am   Home Health Visits - [name] home health nurse has been scheduled to visit three times weekly to perform dressing changes. [name] says the home health nurse has not been to see her since last week. I reached out to [facility] and spoke with [name] in the nurse manager's office who spent a great deal of time reading through the nurses notes in [name] chart. She confirmed that nursing 3x/weekly was ordered and that the nursing staff had made attempts sometimes as frequently as every other day to make contact with [name] by phone or in person. [name] indicated that [name] has hung up on nursing staff trying to schedule visits, has not allowed nurses in the house when they arrive to visit, and has been "verbally abusive" to nursing staff at times. [name] indicated that they have dressing change orders from three different MD's and are concerned about conflicting orders. The most recent orders received are for Silvadene to affected  area followed by 4x4's then Kerlix then ACE bandage.   Level of Care Concerns -[name] is currently staying in an apartment in [place] ([address]) with her son. She has told her care team that she wishes to  be placed at [facility] facility long term.Her FL-2 was faxed to [employer] [facility] [employer].   CAP Services on Hold:  [name] WAS receiving CAP services daily x 7 hours/day during the week and 3 hours/day on the weekend but has NOT continued to receive this services in light of her Medicaid status.   Insurance Coverage/Needs - [name] (daughter, consented for contact and information sharing [phone]) has been helping [name] reapply for Medicaid which is set to run out at the end of this month. According to [name] today, she has completed and submitted all required documentation and understands that Dr. Moshe [name] sent documentation as requested to the [facility] worker.    Transportation - transportation services being used by [name] are as follows:   1) [facility] - 3 day notice required for local transport; 5 day notice required  for out-of-county transportation  2) [name] (through [facility] Medicare; [phone])    Plan:   [name] and I will continue to collaborate with Barbara Opitz [name] with [place] [facility] regarding level of care concerns. The most important factor determining patient's ability to transition to SNF care is ensured ongoing Medicaid status.    [name] will continue to work with her home health nurse and [place] providers to address her skin/wound care needs.   [name] will take all medications as prescribed DAILY.   [name] anticipates surgery on 09/24/17 and is making arrangements with her family to plan for a hospital admission.   [facility] CM Care Plan Problem One     Most Recent Value  Care Plan Problem One  Level of Care Concerns with Care Coordination Needs and Associated Knowledge Deficits   Role Documenting the Problem One  Care Management Coordinator  Care Plan for Problem One  Active  THN Long Term Goal   Over the next 31 days, patient will work with [name] and PCP office to explore long  term care options  THN Long Term Goal Start Date  08/31/17  Interventions for Problem One Long Term Goal  reviewed current status of Medicaid application in view of need for change in level of care,  collaboration with [name] and provider office staff  [facility] CM Short Term Goal #1   Over the next 30 days, patient will speak with [name] by phone or in person to discuss options for long term care needs  [facility] CM Short Term Goal #1 Start Date  08/31/17  Interventions for Short Term Goal #1  confirmed collaboration with patient/[name]    [facility] CM Care Plan Problem Two     Most Recent Value  Care Plan Problem Two  Medication Management Adherence Deficits and Knowledge Deficits as evidenced by patient's voiced concerns and questions and query with pharmacy about fill dates  Role Documenting the Problem Two  Care Management [place] for Problem Two  Active  Interventions for Problem Two Long Term Goal   medications reviewed,  discussed the importance of medication adherence in particular around HTN management  THN Long Term Goal  Over the next 60 days, patient will verbalize understanding of medication regimen and will demonstrate adherence 75% of the time as evidenced by pill count  and nursing observation  [place] Term Goal Start Date  08/31/17  THN CM Short Term Goal #1   Over the next 30 days, patient will work with [facility] 2x/weekly to address medication knowledge deficits  THN CM Short Term Goal #1 Start Date  08/31/17  Interventions for Short Term Goal #2   discussed with patient the [facility] visit schedule and offered to make additional visits between [facility] visits  THN CM Short Term Goal #2   Over the next 7 days, patient will confirm receipt of all prescribed medications and verbalize improvement in self administration/adherence  THN CM Short Term Goal #2 Start Date  08/31/17  [facility] CM Short Term Goal #2 Met Date  09/15/17  Interventions for Short Term Goal #2  confirmed by phone with patien that she  has all prescribed medications  THN CM Short Term Goal #3   Over the next 30 days, patient will call pharmacy 7 days prior to end of any medication supply to request refill  THN CM Short Term Goal #3 Start Date  08/31/17  Interventions for Short Term Goal #3  reminded patient via discussion that she is responsible for calling pharmacy for needed Rx refills 7 days prior to running out of medications    [facility] CM Care Plan Problem Three     Most Recent Value  Care Plan Problem Three  Knowledge Deficits related to Self Health management of lower extremity cellulitis  Role Documenting the Problem Three  Care Management [place] for Problem Three  Active  THN Long Term Goal   Over the next 60 days, patient will demonstrate understanding of plan of care for long term self health management of intermittent/chronic lower extremity cellulitis as evidenced by patient verbalization of daily care protocol, signs and symptoms warranting a call to the doctor, and how chronic disease management affects cellulitis management outcomes  THN Long Term Goal Start Date  08/31/17  Interventions for Problem Three Long Term Goal  discussed current plan of care and changes to plan of care for managmenet of lower extremity cellulitis and skin wounds  THN CM Short Term Goal #1   Over the next 30 days, patient will work with [facility] to establish firm understanding of daily care guidelines for self health mangaement of bilateral lower extremity cellulitis  THN CM Short Term Goal #1 Start Date  08/31/17  Interventions for Short Term Goal #1  ensured that patient is being visited by [facility] at least twice weekly  THN CM Short Term Goal #2   Over the next 30 days, patient will verablize understanding of medications or treatments used for managemnet of bilateral lower extremity cellutisi  THN CM Short Term Goal #2 Start Date  08/31/17  Interventions for Short Term Goal #2  discussed ongoing treatment interventions for  management of lower extremity cellulitis and wounds  THN CM Short Term Goal #3   Over the next 30 days, patient will verbalize understanding of signs and symptoms of worsening cellulitis which warrant a call to provider  [facility]  CM Short Term Goal #3 Start Date  08/31/17  Interventions for Short Term Goal #3  reviewed signs and symptoms of worsening lower extremity condition including swelling, increased redness, warmth, drainage, or bleeding      Janalyn Shy MHA,BSN,RN,CCM [facility] Care Management  [phone]

## 2017-09-18 DIAGNOSIS — L97221 Non-pressure chronic ulcer of left calf limited to breakdown of skin: Secondary | ICD-10-CM | POA: Diagnosis not present

## 2017-09-18 DIAGNOSIS — E11622 Type 2 diabetes mellitus with other skin ulcer: Secondary | ICD-10-CM | POA: Diagnosis not present

## 2017-09-18 DIAGNOSIS — L97211 Non-pressure chronic ulcer of right calf limited to breakdown of skin: Secondary | ICD-10-CM | POA: Diagnosis not present

## 2017-09-18 DIAGNOSIS — E1122 Type 2 diabetes mellitus with diabetic chronic kidney disease: Secondary | ICD-10-CM | POA: Diagnosis not present

## 2017-09-18 DIAGNOSIS — G834 Cauda equina syndrome: Secondary | ICD-10-CM | POA: Diagnosis not present

## 2017-09-18 DIAGNOSIS — I5021 Acute systolic (congestive) heart failure: Secondary | ICD-10-CM | POA: Diagnosis not present

## 2017-09-18 DIAGNOSIS — L97821 Non-pressure chronic ulcer of other part of left lower leg limited to breakdown of skin: Secondary | ICD-10-CM | POA: Diagnosis not present

## 2017-09-18 DIAGNOSIS — I130 Hypertensive heart and chronic kidney disease with heart failure and stage 1 through stage 4 chronic kidney disease, or unspecified chronic kidney disease: Secondary | ICD-10-CM | POA: Diagnosis not present

## 2017-09-18 DIAGNOSIS — E1151 Type 2 diabetes mellitus with diabetic peripheral angiopathy without gangrene: Secondary | ICD-10-CM | POA: Diagnosis not present

## 2017-09-18 DIAGNOSIS — N183 Chronic kidney disease, stage 3 (moderate): Secondary | ICD-10-CM | POA: Diagnosis not present

## 2017-09-18 DIAGNOSIS — L988 Other specified disorders of the skin and subcutaneous tissue: Secondary | ICD-10-CM | POA: Diagnosis not present

## 2017-09-18 DIAGNOSIS — L97321 Non-pressure chronic ulcer of left ankle limited to breakdown of skin: Secondary | ICD-10-CM | POA: Diagnosis not present

## 2017-09-18 DIAGNOSIS — M48061 Spinal stenosis, lumbar region without neurogenic claudication: Secondary | ICD-10-CM | POA: Diagnosis not present

## 2017-09-20 ENCOUNTER — Other Ambulatory Visit: Payer: Self-pay | Admitting: *Deleted

## 2017-09-20 ENCOUNTER — Telehealth (INDEPENDENT_AMBULATORY_CARE_PROVIDER_SITE_OTHER): Payer: Self-pay | Admitting: Radiology

## 2017-09-20 ENCOUNTER — Other Ambulatory Visit: Payer: Self-pay

## 2017-09-20 ENCOUNTER — Encounter (HOSPITAL_COMMUNITY): Payer: Self-pay | Admitting: *Deleted

## 2017-09-20 ENCOUNTER — Telehealth (INDEPENDENT_AMBULATORY_CARE_PROVIDER_SITE_OTHER): Payer: Self-pay | Admitting: Orthopedic Surgery

## 2017-09-20 DIAGNOSIS — L97211 Non-pressure chronic ulcer of right calf limited to breakdown of skin: Secondary | ICD-10-CM | POA: Diagnosis not present

## 2017-09-20 DIAGNOSIS — E11622 Type 2 diabetes mellitus with other skin ulcer: Secondary | ICD-10-CM | POA: Diagnosis not present

## 2017-09-20 DIAGNOSIS — G834 Cauda equina syndrome: Secondary | ICD-10-CM | POA: Diagnosis not present

## 2017-09-20 DIAGNOSIS — N183 Chronic kidney disease, stage 3 (moderate): Secondary | ICD-10-CM | POA: Diagnosis not present

## 2017-09-20 DIAGNOSIS — E1151 Type 2 diabetes mellitus with diabetic peripheral angiopathy without gangrene: Secondary | ICD-10-CM | POA: Diagnosis not present

## 2017-09-20 DIAGNOSIS — M48061 Spinal stenosis, lumbar region without neurogenic claudication: Secondary | ICD-10-CM | POA: Diagnosis not present

## 2017-09-20 DIAGNOSIS — E1122 Type 2 diabetes mellitus with diabetic chronic kidney disease: Secondary | ICD-10-CM | POA: Diagnosis not present

## 2017-09-20 DIAGNOSIS — L97221 Non-pressure chronic ulcer of left calf limited to breakdown of skin: Secondary | ICD-10-CM | POA: Diagnosis not present

## 2017-09-20 DIAGNOSIS — L97321 Non-pressure chronic ulcer of left ankle limited to breakdown of skin: Secondary | ICD-10-CM | POA: Diagnosis not present

## 2017-09-20 DIAGNOSIS — I5021 Acute systolic (congestive) heart failure: Secondary | ICD-10-CM | POA: Diagnosis not present

## 2017-09-20 DIAGNOSIS — I130 Hypertensive heart and chronic kidney disease with heart failure and stage 1 through stage 4 chronic kidney disease, or unspecified chronic kidney disease: Secondary | ICD-10-CM

## 2017-09-20 DIAGNOSIS — L97821 Non-pressure chronic ulcer of other part of left lower leg limited to breakdown of skin: Secondary | ICD-10-CM | POA: Diagnosis not present

## 2017-09-20 DIAGNOSIS — L988 Other specified disorders of the skin and subcutaneous tissue: Secondary | ICD-10-CM

## 2017-09-20 MED ORDER — SILVER SULFADIAZINE 1 % EX CREA: 1.0000 "application " | TOPICAL_CREAM | Freq: Every day | CUTANEOUS | 0 refills | Status: DC

## 2017-09-20 NOTE — Telephone Encounter (Signed)
Crystal calling [facility] about patient, patient does not have silvadene, has santyl currently, will use in interim. Rx for silvadene sent into [facility] at 1116am.

## 2017-09-20 NOTE — Patient Outreach (Signed)
[facility]) Care Management  09/20/2017  [name] May 13, 1966 [id number]  Message received from [place], nursing supervisor at [place] at [facility]. [name] states that when her staff nurse showed up to a scheduled appointment on Friday to do [name] dressing changes, [name] refused to allow the nurse to visit. [name] was placed back on the schedule for a visit on Saturday. I have not been able to speak with anyone at [facility] this morning to see if she was able to be seen and I have been unable to reach [name] this morning.   Plan: I will update/notify Dr. Moshe Cipro of the information as outlined above. [name] is scheduled for an appointment at the [facility] on Friday, 11/30. She was seen on 09/13/17 by Dr. Sharol Given. He gave her a prescription for doxycycline and was told to anticipate surgery (debridement) on 09/22/17.  I called the office of Dr. Sharol Given [facility],  [phone]) and left a message for Dr. Jess Barters nurse to see if she would return a call to me to discuss any care coordination needs re: the planned debridement on 09/22/17. When I spoke with [name] last week she said she was aware of this plan and awaited further instructions from Dr. Jess Barters office.  I will follow up with [name] by phone when I hear from Dr. Jess Barters office.   [facility] CM Care Plan Problem One     Most Recent Value  Care Plan Problem One  Level of Care Concerns with Care Coordination Needs and Associated Knowledge Deficits   Role Documenting the Problem One  Care Management Coordinator  Care Plan for Problem One  Active  THN Long Term Goal   Over the next 31 days, patient will work with LCSW and PCP office to explore long term care options  THN Long Term Goal Start Date  08/31/17  Interventions for Problem One Long Term Goal  update to PCP and LCSW re: patient status and potential plans for hospitlization with possible SNF admit    [facility] CM Care Plan  Problem Three     Most Recent Value  Care Plan Problem Three  Knowledge Deficits related to Self Health management of lower extremity cellulitis  Role Documenting the Problem Three  Care Management [place] for Problem Three  Active  THN Long Term Goal   Over the next 60 days, patient will demonstrate understanding of plan of care for long term self health management of intermittent/chronic lower extremity cellulitis as evidenced by patient verbalization of daily care protocol, signs and symptoms warranting a call to the doctor, and how chronic disease management affects cellulitis management outcomes  THN Long Term Goal Start Date  08/31/17  Interventions for Problem Three Long Term Goal  collaboration with home health, PCP, LCSW, orthopedist practice re: plan of care for LE cellulitis  THN CM Short Term Goal #1   Over the next 30 days, patient will work with [facility] to establish firm understanding of daily care guidelines for self health mangaement of bilateral lower extremity cellulitis  THN CM Short Term Goal #1 Start Date  08/31/17  Interventions for Short Term Goal #1  telephone collaboration with [facility] office [place] Care Management  [phone]

## 2017-09-20 NOTE — Progress Notes (Signed)
Spoke with pt's sister, Sonny Dandy for pre-op call. Pt handed the phone to her sister to talk to me. [name] states pt does not have a cardiac history, but has had several mini strokes in the past 2 years. Pt is a type 2 diabetic. Not sure what her A1C was and does not check her blood sugar at home. Instructed [name] to have pt take 1/2 of pt's regular dose of Lantus Insulin Tuesday evening, will take 4 units. Instructed [name] to have pt check her blood sugar Wednesday AM (pt has a meter) when she gets up and every 2 hours until she leaves for the hospital. If blood sugar is 70 or below, treat with 1/2 cup of clear juice (apple or cranberry) and recheck blood sugar 15 minutes after drinking juice. If blood sugar continues to be 70 or below, call the Short Stay department and ask to speak to a nurse. Sherry voiced understanding. Pt is on Plavix and Aspirin, [name] states no one instructed them to stop either one. I told her that I would call Dr. Jess Barters office in AM and check with them and let her know if she needs to hold it.

## 2017-09-20 NOTE — Telephone Encounter (Signed)
Janalyn Shy, Case Manager, with [place], left a message in regards to the patient's debreavement on 09/22/17.  She is wanting to know if there are any instructions and if we have been able to get in touch with her.  UD#[phone].  Thank you.

## 2017-09-21 ENCOUNTER — Other Ambulatory Visit (INDEPENDENT_AMBULATORY_CARE_PROVIDER_SITE_OTHER): Payer: Self-pay | Admitting: Orthopedic Surgery

## 2017-09-21 ENCOUNTER — Other Ambulatory Visit: Payer: Self-pay | Admitting: *Deleted

## 2017-09-21 ENCOUNTER — Encounter (HOSPITAL_COMMUNITY): Payer: Self-pay | Admitting: Emergency Medicine

## 2017-09-21 DIAGNOSIS — E1152 Type 2 diabetes mellitus with diabetic peripheral angiopathy with gangrene: Secondary | ICD-10-CM

## 2017-09-21 DIAGNOSIS — L97509 Non-pressure chronic ulcer of other part of unspecified foot with unspecified severity: Principal | ICD-10-CM

## 2017-09-21 DIAGNOSIS — E11621 Type 2 diabetes mellitus with foot ulcer: Secondary | ICD-10-CM

## 2017-09-21 NOTE — Telephone Encounter (Signed)
I called and spoke with [name], about this patient. As far as I know there has been no trouble in communication with patient. [name] is going to speak with family today, [name] patients sister about surgery date and time. Patient has had some difficulty with compliance, but hopefully is turning around as she is now allowing home health nursing to come out for dressing changes. [facility] is [name]. Advised patient will be discharged with veraflo wound vac and will most likely need home health services. She did provide me with [name]'s care managers number in case we would need anything from them [phone]. Or we can reach out to [facility] for other coordination of care needs.

## 2017-09-21 NOTE — Patient Outreach (Signed)
[facility]) Care Management  09/21/2017  JIAH BARI 1966-10-08 [id number]  Incoming call received from Dana Allan, Nursing Supervisor at [place] at [facility] [phone]) who reports that Ms. Votaw allowed the home health nurse to complete her visit (dressing changes) on Saturday and that Ms. Lavalle has agreed to allow this same nurse to continue making visits.   I reached out to the office of Dr. Sharol Given yesterday, leaving a message re: plans for debridement at the hospital tomorrow. I received a return call from Dr. Jess Barters assistant who indicated that Ms. Rozeboom sister Junious Dresser had been contacted about admission information for tomorrow's scheduled procedure and confirmed receipt and plans for Ms. Seaberry to be there.   Plan: I will notify the hospital case manager of our work with Ms.Cerros upon her admission and will continue to coordinate/collaborate with providers and home care agency and our LCSW re: Ms. Hipps progress.   [facility] CM Care Plan Problem One     Most Recent Value  Care Plan Problem One  Level of Care Concerns with Care Coordination Needs and Associated Knowledge Deficits   (Pended)   Role Documenting the Problem One  Care Management Coordinator  (Pended)   Care Plan for Problem One  Active  (Pended)   THN Long Term Goal   Over the next 31 days, patient will work with LCSW and PCP office to explore long term care options  (Pended)   THN Long Term Goal Start Date  08/31/17  (Pended)   Interventions for Problem One Long Term Goal  update to PCP and LCSW re: patient status and potential plans for hospitlization with possible SNF admit  (Pended)     THN CM Care Plan Problem Three     Most Recent Value  Care Plan Problem Three  Knowledge Deficits related to Self Health management of lower extremity cellulitis  (Pended)   Role Documenting the Problem Three  Care Management Coordinator  (Pended)   [place] for Problem Three  Active  (Pended)   THN Long  Term Goal   Over the next 60 days, patient will demonstrate understanding of plan of care for long term self health management of intermittent/chronic lower extremity cellulitis as evidenced by patient verbalization of daily care protocol, signs and symptoms warranting a call to the doctor, and how chronic disease management affects cellulitis management outcomes  (Pended)   [place] Term Goal Start Date  08/31/17  (Pended)   Interventions for Problem Three Long Term Goal  collaboration with home health, PCP, LCSW, orthopedist practice re: plan of care for LE cellulitis  (Pended)   THN CM Short Term Goal #1   Over the next 30 days, patient will work with [facility] to establish firm understanding of daily care guidelines for self health mangaement of bilateral lower extremity cellulitis  (Pended)   THN CM Short Term Goal #1 Start Date  08/31/17  (Pended)   Interventions for Short Term Goal #1  telephone collaboration with [facility] office supervivsor  (Pended)         [place] Management  [phone]

## 2017-09-21 NOTE — Patient Outreach (Signed)
[facility]) Care Management  09/21/2017  TOWANDA HORNSTEIN [birth year] [id number]   CSW attempted to reach patient via [phone] but phone kept ringing without an option to leave a voicemail. CSW spoke with patient's sister, Junious Dresser [phone] to confirm that patient is going to be admitted to [facility] tomorrow for debridement & wound vac placement. Junious Dresser informed CSW that she had missed a call from [place] worker, Tressie Stalker yesterday & attempted to call her back, but they are still waiting for her Medicaid to be approved. CSW left a voicemail for Hinton Dyer as well at [phone] to follow-up on status of Medicaid application. CSW will await call back from [facility] and follow-up with inpatient CSW once patient has been admitted.    Raynaldo Opitz, LCSW [facility]  Clinical Social Worker cell #: [phone]

## 2017-09-21 NOTE — Telephone Encounter (Signed)
See previous message. Left message that if BP was 180/120 again that she needed ER eval

## 2017-09-21 NOTE — Progress Notes (Signed)
Spoke with [name] Lyons at Dr. Jess Barters office. She states pt was not supposed to stop Plavix. Pt stated yesterday that she had not stopped Plavix.

## 2017-09-21 NOTE — Progress Notes (Signed)
Anesthesia Chart Review:  Pt is a same day work up.   Pt is a 51 year old female scheduled for B debridement leg/foot ulcers, apply veraflo wound vac on 09/22/2017 with Barbara Score, MD  - PCP is Barbara Nakayama, MD  - Cardiologist is Barbara Sable, MD. Last office visit 08/30/17 with Barbara Sims, NP.  F/u visit scheduled for 09/30/17.  Pt also has visit with EP cardiologist Barbara Peru, MD scheduled for 09/27/17  - Saw neurology while inpatient for CVAs, last progress note 06/25/17; pt was to f/u with neuro in 6 weeks but has not done so yet.   PMH includes:  Chronic combined systolic and diastolic HF, hypertensive cardiomyopathy, PAT, CVAs (06/24/17), HTN, DM, hyperlipidemia, COPD, OSA, diabetic Charcot foot, CKD (stage III), alcohol abuse, cocaine abuse. Former smoker (quit 06/03/17). BMI 24.5.  - ED visit 09/14/17 for uncontrolled HTN and dizziness. BP was 195/123.   - Hospitalized 10/8-12/18 for cellulitis BLE.   - Hospitalized 8/27-06/30/17 for hypoxia with confusion (thought due to cocaine and THC use), altered mental status, hypotension with periods of apnea, acute CVAs (bilateral watershed infarcts including b/l MCA/ACA, right MCA/PCA, right M1/M2, left thalamus and left caudate head; MRA head b/l MCA and b/l PCA stenosis; pt not a candidate for loop recorder due to poor compliance and living situation), elevated troponin (thought due to demand ischemia related to cocaine use), acute systolic CHF exacerbation, acute on chronic kidney disease, Outpatient sleep study recommended.   Medications include: Amiodarone, ASA 81 mg, carvedilol, Plavix, Zetia, fenofibrate, hydralazine, Lantus, DuoNeb, Protonix, potassium, albuterol, rosuvastatin, Spiriva  Labs 09/14/17 from ED reviewed. CMET and CBC acceptable for surgery; glucose was 129 - Last HbA1c was 15.5 on 06/25/17  1 view CXR 06/30/17:  - Lordotic positioning. No active disease. Cardiomegaly and aortic tortuosity.  EKG 09/14/17:  -  Sinus rhythm. Anteroseptal infarct, old  Carotid duplex 06/25/17:  - The vertebral arteries appear patent with antegrade flow. - Findings consistent with 1- 39 percent stenosis involving theright internal carotid artery and the left internal carotidartery.  Echo 06/22/17:  - Left ventricle: The cavity size was normal. Wall thickness was increased in a pattern of moderate LVH. Systolic function was moderately to severely reduced. The estimated ejection fraction was in the range of 30% to 35%. Diffuse hypokinesis. Features are consistent with a pseudonormal left ventricular filling pattern, with concomitant abnormal relaxation and increased filling pressure (grade 2 diastolic dysfunction). - Mitral valve: There was moderate regurgitation directed posteriorly. - Left atrium: The atrium was mildly dilated.  Cardiac cath 07/28/16:   LV end diastolic pressure is mildly elevated.  There is no aortic valve stenosis.  No angiographically apparent coronary artery disease.  Tortuous right subclavian requiring Glide wire. Would not use right radial approach if cath was needed in the future. - Continue aggressive medical therapy and blood pressure control.   As recently as last week, pt was not taking BP meds (was in ED 11/20 with a BP 195/123). It's not clear to me if cocaine use is ongoing.  Pt had multiple small strokes 06/24/17, was to be on plavix 75mg  and ASA 81mg  for 3 months, then continue plavix alone.  Surgery is 09/22/17, two days shy of 3 months, and pt is to continue ASA and plavix perioperatively.   Pt will need further assessment by assigned anesthesiologist day of surgery.  If BP and glucose acceptable, and no acute CV symptoms, I anticipate pt can proceed as scheduled.   Willeen Cass, FNP-BC [facility]/Anesthesiology Phone: [phone] 09/21/2017 4:31 PM

## 2017-09-22 ENCOUNTER — Encounter (HOSPITAL_COMMUNITY)
Admission: RE | Disposition: A | Discharge status: Skilled Nursing Facility | Payer: Self-pay | Source: Ambulatory Visit | Attending: Orthopedic Surgery

## 2017-09-22 ENCOUNTER — Inpatient Hospital Stay (HOSPITAL_COMMUNITY): Payer: Medicare Other | Admitting: Emergency Medicine

## 2017-09-22 ENCOUNTER — Encounter (HOSPITAL_COMMUNITY): Payer: Self-pay | Admitting: *Deleted

## 2017-09-22 ENCOUNTER — Inpatient Hospital Stay (HOSPITAL_COMMUNITY)
Admission: RE | Admit: 2017-09-22 | Discharge: 2017-10-01 | DRG: 264 | Disposition: A | Discharge status: Skilled Nursing Facility | Payer: Medicare Other | Source: Ambulatory Visit | Attending: Orthopedic Surgery | Admitting: Orthopedic Surgery

## 2017-09-22 ENCOUNTER — Other Ambulatory Visit: Payer: Self-pay

## 2017-09-22 DIAGNOSIS — L97523 Non-pressure chronic ulcer of other part of left foot with necrosis of muscle: Secondary | ICD-10-CM | POA: Diagnosis not present

## 2017-09-22 DIAGNOSIS — E11649 Type 2 diabetes mellitus with hypoglycemia without coma: Secondary | ICD-10-CM | POA: Diagnosis not present

## 2017-09-22 DIAGNOSIS — F1411 Cocaine abuse, in remission: Secondary | ICD-10-CM | POA: Diagnosis present

## 2017-09-22 DIAGNOSIS — Z8673 Personal history of transient ischemic attack (TIA), and cerebral infarction without residual deficits: Secondary | ICD-10-CM | POA: Diagnosis not present

## 2017-09-22 DIAGNOSIS — S81801A Unspecified open wound, right lower leg, initial encounter: Secondary | ICD-10-CM | POA: Diagnosis not present

## 2017-09-22 DIAGNOSIS — E11610 Type 2 diabetes mellitus with diabetic neuropathic arthropathy: Secondary | ICD-10-CM | POA: Diagnosis not present

## 2017-09-22 DIAGNOSIS — R2689 Other abnormalities of gait and mobility: Secondary | ICD-10-CM | POA: Diagnosis not present

## 2017-09-22 DIAGNOSIS — S81001A Unspecified open wound, right knee, initial encounter: Secondary | ICD-10-CM | POA: Diagnosis not present

## 2017-09-22 DIAGNOSIS — E559 Vitamin D deficiency, unspecified: Secondary | ICD-10-CM | POA: Diagnosis not present

## 2017-09-22 DIAGNOSIS — Z87891 Personal history of nicotine dependence: Secondary | ICD-10-CM

## 2017-09-22 DIAGNOSIS — I96 Gangrene, not elsewhere classified: Secondary | ICD-10-CM | POA: Diagnosis not present

## 2017-09-22 DIAGNOSIS — E11621 Type 2 diabetes mellitus with foot ulcer: Secondary | ICD-10-CM | POA: Diagnosis present

## 2017-09-22 DIAGNOSIS — D6489 Other specified anemias: Secondary | ICD-10-CM | POA: Diagnosis present

## 2017-09-22 DIAGNOSIS — F410 Panic disorder [episodic paroxysmal anxiety] without agoraphobia: Secondary | ICD-10-CM | POA: Diagnosis present

## 2017-09-22 DIAGNOSIS — R278 Other lack of coordination: Secondary | ICD-10-CM | POA: Diagnosis not present

## 2017-09-22 DIAGNOSIS — N179 Acute kidney failure, unspecified: Secondary | ICD-10-CM | POA: Diagnosis present

## 2017-09-22 DIAGNOSIS — N183 Chronic kidney disease, stage 3 unspecified: Secondary | ICD-10-CM | POA: Diagnosis present

## 2017-09-22 DIAGNOSIS — I5042 Chronic combined systolic (congestive) and diastolic (congestive) heart failure: Secondary | ICD-10-CM | POA: Diagnosis present

## 2017-09-22 DIAGNOSIS — E1140 Type 2 diabetes mellitus with diabetic neuropathy, unspecified: Secondary | ICD-10-CM | POA: Diagnosis not present

## 2017-09-22 DIAGNOSIS — L97509 Non-pressure chronic ulcer of other part of unspecified foot with unspecified severity: Secondary | ICD-10-CM

## 2017-09-22 DIAGNOSIS — S81839A Puncture wound without foreign body, unspecified lower leg, initial encounter: Secondary | ICD-10-CM | POA: Diagnosis not present

## 2017-09-22 DIAGNOSIS — Z8249 Family history of ischemic heart disease and other diseases of the circulatory system: Secondary | ICD-10-CM

## 2017-09-22 DIAGNOSIS — L03115 Cellulitis of right lower limb: Secondary | ICD-10-CM | POA: Diagnosis not present

## 2017-09-22 DIAGNOSIS — I160 Hypertensive urgency: Secondary | ICD-10-CM | POA: Diagnosis not present

## 2017-09-22 DIAGNOSIS — I43 Cardiomyopathy in diseases classified elsewhere: Secondary | ICD-10-CM | POA: Diagnosis not present

## 2017-09-22 DIAGNOSIS — E1122 Type 2 diabetes mellitus with diabetic chronic kidney disease: Secondary | ICD-10-CM | POA: Diagnosis not present

## 2017-09-22 DIAGNOSIS — L97513 Non-pressure chronic ulcer of other part of right foot with necrosis of muscle: Secondary | ICD-10-CM | POA: Diagnosis present

## 2017-09-22 DIAGNOSIS — E119 Type 2 diabetes mellitus without complications: Secondary | ICD-10-CM | POA: Diagnosis not present

## 2017-09-22 DIAGNOSIS — I10 Essential (primary) hypertension: Secondary | ICD-10-CM | POA: Diagnosis present

## 2017-09-22 DIAGNOSIS — F1010 Alcohol abuse, uncomplicated: Secondary | ICD-10-CM | POA: Diagnosis present

## 2017-09-22 DIAGNOSIS — E11622 Type 2 diabetes mellitus with other skin ulcer: Secondary | ICD-10-CM | POA: Diagnosis not present

## 2017-09-22 DIAGNOSIS — L97919 Non-pressure chronic ulcer of unspecified part of right lower leg with unspecified severity: Secondary | ICD-10-CM | POA: Diagnosis not present

## 2017-09-22 DIAGNOSIS — L97929 Non-pressure chronic ulcer of unspecified part of left lower leg with unspecified severity: Secondary | ICD-10-CM | POA: Diagnosis not present

## 2017-09-22 DIAGNOSIS — E785 Hyperlipidemia, unspecified: Secondary | ICD-10-CM | POA: Diagnosis present

## 2017-09-22 DIAGNOSIS — S81802A Unspecified open wound, left lower leg, initial encounter: Secondary | ICD-10-CM | POA: Diagnosis not present

## 2017-09-22 DIAGNOSIS — E1165 Type 2 diabetes mellitus with hyperglycemia: Secondary | ICD-10-CM | POA: Diagnosis not present

## 2017-09-22 DIAGNOSIS — E1152 Type 2 diabetes mellitus with diabetic peripheral angiopathy with gangrene: Principal | ICD-10-CM | POA: Diagnosis present

## 2017-09-22 DIAGNOSIS — E11628 Type 2 diabetes mellitus with other skin complications: Secondary | ICD-10-CM | POA: Diagnosis present

## 2017-09-22 DIAGNOSIS — I471 Supraventricular tachycardia: Secondary | ICD-10-CM | POA: Diagnosis not present

## 2017-09-22 DIAGNOSIS — J449 Chronic obstructive pulmonary disease, unspecified: Secondary | ICD-10-CM | POA: Diagnosis not present

## 2017-09-22 DIAGNOSIS — F322 Major depressive disorder, single episode, severe without psychotic features: Secondary | ICD-10-CM | POA: Diagnosis present

## 2017-09-22 DIAGNOSIS — I70263 Atherosclerosis of native arteries of extremities with gangrene, bilateral legs: Secondary | ICD-10-CM | POA: Diagnosis not present

## 2017-09-22 DIAGNOSIS — I130 Hypertensive heart and chronic kidney disease with heart failure and stage 1 through stage 4 chronic kidney disease, or unspecified chronic kidney disease: Secondary | ICD-10-CM | POA: Diagnosis present

## 2017-09-22 DIAGNOSIS — F329 Major depressive disorder, single episode, unspecified: Secondary | ICD-10-CM | POA: Diagnosis present

## 2017-09-22 DIAGNOSIS — M6281 Muscle weakness (generalized): Secondary | ICD-10-CM | POA: Diagnosis not present

## 2017-09-22 DIAGNOSIS — I739 Peripheral vascular disease, unspecified: Secondary | ICD-10-CM | POA: Diagnosis not present

## 2017-09-22 DIAGNOSIS — L97529 Non-pressure chronic ulcer of other part of left foot with unspecified severity: Secondary | ICD-10-CM | POA: Diagnosis not present

## 2017-09-22 DIAGNOSIS — Z79899 Other long term (current) drug therapy: Secondary | ICD-10-CM

## 2017-09-22 DIAGNOSIS — L8992 Pressure ulcer of unspecified site, stage 2: Secondary | ICD-10-CM | POA: Diagnosis not present

## 2017-09-22 DIAGNOSIS — I252 Old myocardial infarction: Secondary | ICD-10-CM

## 2017-09-22 DIAGNOSIS — G4730 Sleep apnea, unspecified: Secondary | ICD-10-CM | POA: Diagnosis present

## 2017-09-22 DIAGNOSIS — N1832 Chronic kidney disease, stage 3b: Secondary | ICD-10-CM | POA: Diagnosis present

## 2017-09-22 DIAGNOSIS — L97219 Non-pressure chronic ulcer of right calf with unspecified severity: Secondary | ICD-10-CM | POA: Diagnosis present

## 2017-09-22 DIAGNOSIS — D649 Anemia, unspecified: Secondary | ICD-10-CM | POA: Diagnosis not present

## 2017-09-22 DIAGNOSIS — F419 Anxiety disorder, unspecified: Secondary | ICD-10-CM | POA: Diagnosis not present

## 2017-09-22 DIAGNOSIS — E1129 Type 2 diabetes mellitus with other diabetic kidney complication: Secondary | ICD-10-CM | POA: Diagnosis not present

## 2017-09-22 DIAGNOSIS — Z803 Family history of malignant neoplasm of breast: Secondary | ICD-10-CM

## 2017-09-22 DIAGNOSIS — S91001A Unspecified open wound, right ankle, initial encounter: Secondary | ICD-10-CM

## 2017-09-22 DIAGNOSIS — F1410 Cocaine abuse, uncomplicated: Secondary | ICD-10-CM | POA: Diagnosis present

## 2017-09-22 DIAGNOSIS — L97519 Non-pressure chronic ulcer of other part of right foot with unspecified severity: Secondary | ICD-10-CM | POA: Diagnosis not present

## 2017-09-22 DIAGNOSIS — E1121 Type 2 diabetes mellitus with diabetic nephropathy: Secondary | ICD-10-CM | POA: Diagnosis not present

## 2017-09-22 DIAGNOSIS — R233 Spontaneous ecchymoses: Secondary | ICD-10-CM | POA: Diagnosis present

## 2017-09-22 DIAGNOSIS — I509 Heart failure, unspecified: Secondary | ICD-10-CM | POA: Diagnosis not present

## 2017-09-22 DIAGNOSIS — L089 Local infection of the skin and subcutaneous tissue, unspecified: Secondary | ICD-10-CM | POA: Diagnosis not present

## 2017-09-22 DIAGNOSIS — E1159 Type 2 diabetes mellitus with other circulatory complications: Secondary | ICD-10-CM | POA: Diagnosis present

## 2017-09-22 HISTORY — DX: Cerebral infarction, unspecified: I63.9

## 2017-09-22 HISTORY — PX: I & D EXTREMITY: SHX5045

## 2017-09-22 HISTORY — DX: Sleep apnea, unspecified: G47.30

## 2017-09-22 LAB — GLUCOSE, CAPILLARY
Glucose-Capillary: 78 mg/dL (ref 65–99)
Glucose-Capillary: 74 mg/dL (ref 65–99)
Glucose-Capillary: 75 mg/dL (ref 65–99)
Glucose-Capillary: 148 mg/dL — ABNORMAL HIGH (ref 65–99)

## 2017-09-22 SURGERY — IRRIGATION AND DEBRIDEMENT EXTREMITY
Anesthesia: General | Site: Leg Lower | Laterality: Bilateral

## 2017-09-22 MED ORDER — MONTELUKAST SODIUM 10 MG PO TABS
10.0000 mg | ORAL_TABLET | Freq: Every day | ORAL | Status: DC
  Administered 2017-09-22 – 2017-09-30 (×9): 10 mg via ORAL
  Filled 2017-09-22 (×9): qty 1

## 2017-09-22 MED ORDER — INSULIN GLARGINE 100 UNIT/ML ~~LOC~~ SOLN
8.0000 [IU] | Freq: Every day | SUBCUTANEOUS | Status: DC
  Administered 2017-09-22: 8 [IU] via SUBCUTANEOUS
  Filled 2017-09-22 (×4): qty 0.08

## 2017-09-22 MED ORDER — VANCOMYCIN HCL IN DEXTROSE 1-5 GM/200ML-% IV SOLN
1000.0000 mg | Freq: Once | INTRAVENOUS | Status: AC
  Administered 2017-09-22: 1000 mg via INTRAVENOUS
  Filled 2017-09-22: qty 200

## 2017-09-22 MED ORDER — AMIODARONE HCL 100 MG PO TABS
200.0000 mg | ORAL_TABLET | Freq: Two times a day (BID) | ORAL | Status: DC
  Administered 2017-09-22 – 2017-10-01 (×17): 200 mg via ORAL
  Filled 2017-09-22 (×17): qty 2

## 2017-09-22 MED ORDER — FENTANYL CITRATE (PF) 100 MCG/2ML IJ SOLN
50.0000 ug | Freq: Once | INTRAMUSCULAR | Status: AC
  Administered 2017-09-22: 50 ug via INTRAVENOUS

## 2017-09-22 MED ORDER — METOCLOPRAMIDE HCL 5 MG/ML IJ SOLN: 5.0000 mg | Freq: Three times a day (TID) | INTRAMUSCULAR | Status: DC | PRN

## 2017-09-22 MED ORDER — HYDROMORPHONE HCL 1 MG/ML IJ SOLN
1.0000 mg | INTRAMUSCULAR | Status: DC | PRN
  Administered 2017-09-23 – 2017-09-29 (×10): 1 mg via INTRAVENOUS
  Filled 2017-09-22 (×11): qty 1

## 2017-09-22 MED ORDER — POLYETHYLENE GLYCOL 3350 17 G PO PACK
17.0000 g | PACK | Freq: Every day | ORAL | Status: DC | PRN
  Administered 2017-09-24 – 2017-09-26 (×2): 17 g via ORAL
  Filled 2017-09-22 (×2): qty 1

## 2017-09-22 MED ORDER — TIOTROPIUM BROMIDE MONOHYDRATE 18 MCG IN CAPS
18.0000 ug | ORAL_CAPSULE | Freq: Every day | RESPIRATORY_TRACT | Status: DC
  Administered 2017-09-23 – 2017-10-01 (×8): 18 ug via RESPIRATORY_TRACT
  Filled 2017-09-22 (×3): qty 5

## 2017-09-22 MED ORDER — MECLIZINE HCL 25 MG PO TABS
25.0000 mg | ORAL_TABLET | Freq: Three times a day (TID) | ORAL | Status: DC | PRN
  Filled 2017-09-22: qty 1

## 2017-09-22 MED ORDER — MIDAZOLAM HCL 2 MG/2ML IJ SOLN
INTRAMUSCULAR | Status: DC | PRN
  Administered 2017-09-22: 0.5 mg via INTRAVENOUS

## 2017-09-22 MED ORDER — ROSUVASTATIN CALCIUM 10 MG PO TABS
40.0000 mg | ORAL_TABLET | Freq: Every day | ORAL | Status: DC
  Administered 2017-09-22 – 2017-09-30 (×8): 40 mg via ORAL
  Filled 2017-09-22 (×8): qty 4

## 2017-09-22 MED ORDER — PHENYLEPHRINE 40 MCG/ML (10ML) SYRINGE FOR IV PUSH (FOR BLOOD PRESSURE SUPPORT)
PREFILLED_SYRINGE | INTRAVENOUS | Status: DC | PRN
Start: 2017-09-22 — End: 2017-09-22
  Administered 2017-09-22 (×2): 80 ug via INTRAVENOUS
  Administered 2017-09-22: 120 ug via INTRAVENOUS

## 2017-09-22 MED ORDER — CHLORHEXIDINE GLUCONATE 4 % EX LIQD
60.0000 mL | Freq: Once | CUTANEOUS | Status: DC
Start: 2017-09-22 — End: 2017-09-22

## 2017-09-22 MED ORDER — INSULIN ASPART 100 UNIT/ML ~~LOC~~ SOLN
0.0000 [IU] | Freq: Three times a day (TID) | SUBCUTANEOUS | Status: DC
  Administered 2017-09-23: 2 [IU] via SUBCUTANEOUS
  Administered 2017-09-23 – 2017-09-27 (×6): 3 [IU] via SUBCUTANEOUS

## 2017-09-22 MED ORDER — FENTANYL CITRATE (PF) 250 MCG/5ML IJ SOLN
INTRAMUSCULAR | Status: AC
Start: 2017-09-22 — End: 2017-09-22
  Filled 2017-09-22: qty 5

## 2017-09-22 MED ORDER — CLOPIDOGREL BISULFATE 75 MG PO TABS
75.0000 mg | ORAL_TABLET | Freq: Every day | ORAL | Status: DC
  Administered 2017-09-23 – 2017-10-01 (×8): 75 mg via ORAL
  Filled 2017-09-22 (×8): qty 1

## 2017-09-22 MED ORDER — ONDANSETRON HCL 4 MG/2ML IJ SOLN
INTRAMUSCULAR | Status: DC | PRN
  Administered 2017-09-22: 4 mg via INTRAVENOUS

## 2017-09-22 MED ORDER — METHOCARBAMOL 500 MG PO TABS
500.0000 mg | ORAL_TABLET | Freq: Four times a day (QID) | ORAL | Status: DC | PRN
  Administered 2017-09-23 – 2017-09-27 (×13): 500 mg via ORAL
  Filled 2017-09-22 (×14): qty 1

## 2017-09-22 MED ORDER — POTASSIUM CHLORIDE CRYS ER 20 MEQ PO TBCR
20.0000 meq | EXTENDED_RELEASE_TABLET | Freq: Every day | ORAL | Status: DC
  Administered 2017-09-22 – 2017-10-01 (×9): 20 meq via ORAL
  Filled 2017-09-22 (×9): qty 1

## 2017-09-22 MED ORDER — OXYCODONE HCL 5 MG PO TABS
5.0000 mg | ORAL_TABLET | ORAL | Status: DC | PRN
  Administered 2017-09-26: 5 mg via ORAL
  Filled 2017-09-22 (×2): qty 1

## 2017-09-22 MED ORDER — PROPOFOL 10 MG/ML IV BOLUS
INTRAVENOUS | Status: AC
  Filled 2017-09-22: qty 20

## 2017-09-22 MED ORDER — MIDAZOLAM HCL 2 MG/2ML IJ SOLN
INTRAMUSCULAR | Status: AC
  Filled 2017-09-22: qty 2

## 2017-09-22 MED ORDER — METOCLOPRAMIDE HCL 5 MG PO TABS
5.0000 mg | ORAL_TABLET | Freq: Three times a day (TID) | ORAL | Status: DC | PRN
  Administered 2017-09-29: 10 mg via ORAL
  Filled 2017-09-22: qty 2

## 2017-09-22 MED ORDER — VANCOMYCIN HCL IN DEXTROSE 750-5 MG/150ML-% IV SOLN: 750.0000 mg | INTRAVENOUS | Status: DC

## 2017-09-22 MED ORDER — ACETAMINOPHEN 325 MG PO TABS
650.0000 mg | ORAL_TABLET | ORAL | Status: DC | PRN
  Administered 2017-09-23 – 2017-09-27 (×3): 650 mg via ORAL
  Filled 2017-09-22 (×3): qty 2

## 2017-09-22 MED ORDER — ONDANSETRON HCL 4 MG PO TABS
4.0000 mg | ORAL_TABLET | Freq: Four times a day (QID) | ORAL | Status: DC | PRN
  Filled 2017-09-22: qty 1

## 2017-09-22 MED ORDER — SODIUM CHLORIDE 0.9 % IR SOLN
Status: DC | PRN
  Administered 2017-09-22: 3000 mL

## 2017-09-22 MED ORDER — MAGNESIUM CITRATE PO SOLN: 1.0000 | Freq: Once | ORAL | Status: DC | PRN

## 2017-09-22 MED ORDER — PIPERACILLIN-TAZOBACTAM 3.375 G IVPB
3.3750 g | Freq: Three times a day (TID) | INTRAVENOUS | Status: DC
  Administered 2017-09-23 – 2017-09-27 (×13): 3.375 g via INTRAVENOUS
  Filled 2017-09-22 (×15): qty 50

## 2017-09-22 MED ORDER — HYDRALAZINE HCL 25 MG PO TABS
25.0000 mg | ORAL_TABLET | Freq: Three times a day (TID) | ORAL | Status: DC
  Administered 2017-09-22 – 2017-09-28 (×15): 25 mg via ORAL
  Filled 2017-09-22 (×17): qty 1

## 2017-09-22 MED ORDER — TRAZODONE HCL 50 MG PO TABS
50.0000 mg | ORAL_TABLET | Freq: Every evening | ORAL | Status: DC | PRN
  Administered 2017-09-22 – 2017-09-27 (×4): 50 mg via ORAL
  Filled 2017-09-22 (×5): qty 1

## 2017-09-22 MED ORDER — CEFAZOLIN SODIUM-DEXTROSE 2-4 GM/100ML-% IV SOLN
2.0000 g | INTRAVENOUS | Status: AC
  Administered 2017-09-22: 2 g via INTRAVENOUS
  Filled 2017-09-22: qty 100

## 2017-09-22 MED ORDER — CARVEDILOL 12.5 MG PO TABS
ORAL_TABLET | ORAL | Status: AC
Start: 2017-09-22 — End: 2017-09-22
  Filled 2017-09-22: qty 2

## 2017-09-22 MED ORDER — METHOCARBAMOL 1000 MG/10ML IJ SOLN
500.0000 mg | Freq: Four times a day (QID) | INTRAMUSCULAR | Status: DC | PRN
  Filled 2017-09-22: qty 5

## 2017-09-22 MED ORDER — OXYCODONE HCL 5 MG PO TABS
10.0000 mg | ORAL_TABLET | ORAL | Status: DC | PRN
  Administered 2017-09-22 – 2017-10-01 (×30): 10 mg via ORAL
  Filled 2017-09-22 (×30): qty 2

## 2017-09-22 MED ORDER — PIPERACILLIN-TAZOBACTAM 3.375 G IVPB 30 MIN
3.3750 g | Freq: Once | INTRAVENOUS | Status: AC
  Administered 2017-09-22: 3.375 g via INTRAVENOUS
  Filled 2017-09-22: qty 50

## 2017-09-22 MED ORDER — CARVEDILOL 25 MG PO TABS
25.0000 mg | ORAL_TABLET | Freq: Two times a day (BID) | ORAL | Status: DC
  Administered 2017-09-22 – 2017-10-01 (×17): 25 mg via ORAL
  Filled 2017-09-22 (×18): qty 1

## 2017-09-22 MED ORDER — FENTANYL CITRATE (PF) 100 MCG/2ML IJ SOLN
INTRAMUSCULAR | Status: AC
  Filled 2017-09-22: qty 2

## 2017-09-22 MED ORDER — FLUOXETINE HCL 20 MG PO CAPS
20.0000 mg | ORAL_CAPSULE | Freq: Every day | ORAL | Status: DC
  Administered 2017-09-22 – 2017-10-01 (×9): 20 mg via ORAL
  Filled 2017-09-22 (×9): qty 1

## 2017-09-22 MED ORDER — EZETIMIBE 10 MG PO TABS
10.0000 mg | ORAL_TABLET | Freq: Every day | ORAL | Status: DC
  Administered 2017-09-22 – 2017-10-01 (×9): 10 mg via ORAL
  Filled 2017-09-22 (×9): qty 1

## 2017-09-22 MED ORDER — FENTANYL CITRATE (PF) 100 MCG/2ML IJ SOLN
INTRAMUSCULAR | Status: DC | PRN
  Administered 2017-09-22 (×3): 50 ug via INTRAVENOUS

## 2017-09-22 MED ORDER — LACTATED RINGERS IV SOLN
INTRAVENOUS | Status: DC
  Administered 2017-09-22: 11:00:00 via INTRAVENOUS

## 2017-09-22 MED ORDER — DOCUSATE SODIUM 100 MG PO CAPS
100.0000 mg | ORAL_CAPSULE | Freq: Two times a day (BID) | ORAL | Status: DC
  Administered 2017-09-22 – 2017-10-01 (×12): 100 mg via ORAL
  Filled 2017-09-22 (×17): qty 1

## 2017-09-22 MED ORDER — INSULIN ASPART 100 UNIT/ML ~~LOC~~ SOLN
4.0000 [IU] | Freq: Three times a day (TID) | SUBCUTANEOUS | Status: DC
  Administered 2017-09-23 – 2017-09-27 (×7): 4 [IU] via SUBCUTANEOUS

## 2017-09-22 MED ORDER — BISACODYL 10 MG RE SUPP: 10.0000 mg | Freq: Every day | RECTAL | Status: DC | PRN

## 2017-09-22 MED ORDER — ONDANSETRON HCL 4 MG/2ML IJ SOLN: 4.0000 mg | Freq: Once | INTRAMUSCULAR | Status: DC | PRN

## 2017-09-22 MED ORDER — IPRATROPIUM-ALBUTEROL 0.5-2.5 (3) MG/3ML IN SOLN: 3.0000 mL | Freq: Four times a day (QID) | RESPIRATORY_TRACT | Status: DC | PRN

## 2017-09-22 MED ORDER — ONDANSETRON HCL 4 MG/2ML IJ SOLN: 4.0000 mg | Freq: Four times a day (QID) | INTRAMUSCULAR | Status: DC | PRN

## 2017-09-22 MED ORDER — FENTANYL CITRATE (PF) 100 MCG/2ML IJ SOLN
INTRAMUSCULAR | Status: AC
Start: 2017-09-22 — End: 2017-09-22
  Administered 2017-09-22: 50 ug via INTRAVENOUS
  Filled 2017-09-22: qty 2

## 2017-09-22 MED ORDER — 0.9 % SODIUM CHLORIDE (POUR BTL) OPTIME
TOPICAL | Status: DC | PRN
  Administered 2017-09-22: 1000 mL

## 2017-09-22 MED ORDER — ACETAMINOPHEN 650 MG RE SUPP: 650.0000 mg | RECTAL | Status: DC | PRN

## 2017-09-22 MED ORDER — CARVEDILOL 12.5 MG PO TABS
25.0000 mg | ORAL_TABLET | Freq: Once | ORAL | Status: AC
  Administered 2017-09-22: 25 mg via ORAL

## 2017-09-22 MED ORDER — PANTOPRAZOLE SODIUM 40 MG PO TBEC
40.0000 mg | DELAYED_RELEASE_TABLET | Freq: Every day | ORAL | Status: DC
  Administered 2017-09-22 – 2017-10-01 (×8): 40 mg via ORAL
  Filled 2017-09-22 (×8): qty 1

## 2017-09-22 MED ORDER — LINAGLIPTIN 5 MG PO TABS
5.0000 mg | ORAL_TABLET | Freq: Every day | ORAL | Status: DC
  Administered 2017-09-23 – 2017-09-25 (×3): 5 mg via ORAL
  Filled 2017-09-22 (×3): qty 1

## 2017-09-22 MED ORDER — PROPOFOL 10 MG/ML IV BOLUS
INTRAVENOUS | Status: DC | PRN
  Administered 2017-09-22: 100 mg via INTRAVENOUS

## 2017-09-22 MED ORDER — FENTANYL CITRATE (PF) 100 MCG/2ML IJ SOLN
25.0000 ug | INTRAMUSCULAR | Status: DC | PRN
  Administered 2017-09-22 (×2): 50 ug via INTRAVENOUS

## 2017-09-22 MED ORDER — GABAPENTIN 100 MG PO CAPS
100.0000 mg | ORAL_CAPSULE | Freq: Three times a day (TID) | ORAL | Status: DC
  Administered 2017-09-22 – 2017-10-01 (×24): 100 mg via ORAL
  Filled 2017-09-22 (×24): qty 1

## 2017-09-22 MED ORDER — DEXAMETHASONE SODIUM PHOSPHATE 10 MG/ML IJ SOLN
INTRAMUSCULAR | Status: DC | PRN
  Administered 2017-09-22: 10 mg via INTRAVENOUS

## 2017-09-22 MED ORDER — SODIUM CHLORIDE 0.9 % IV SOLN: INTRAVENOUS | Status: DC

## 2017-09-22 MED ORDER — LIDOCAINE 2% (20 MG/ML) 5 ML SYRINGE
INTRAMUSCULAR | Status: DC | PRN
  Administered 2017-09-22: 60 mg via INTRAVENOUS

## 2017-09-22 SURGICAL SUPPLY — 39 items
BLADE SURG 21 STRL SS (BLADE) ×4 IMPLANT
BNDG COHESIVE 6X5 TAN STRL LF (GAUZE/BANDAGES/DRESSINGS) ×3 IMPLANT
BNDG GAUZE ELAST 4 BULKY (GAUZE/BANDAGES/DRESSINGS) ×5 IMPLANT
CANISTER WOUND CARE 500ML ATS (WOUND CARE) ×2 IMPLANT
CASSETTE VERAFLO VERALINK (MISCELLANEOUS) ×2 IMPLANT
CONT SPEC 4OZ CLIKSEAL STRL BL (MISCELLANEOUS) ×1 IMPLANT
COVER MAYO STAND STRL (DRAPES) ×1 IMPLANT
COVER SURGICAL LIGHT HANDLE (MISCELLANEOUS) ×4 IMPLANT
DRAPE EXTREMITY BILATERAL (DRAPES) ×1 IMPLANT
DRAPE U-SHAPE 47X51 STRL (DRAPES) ×2 IMPLANT
DRESSING VERAFLO CLEANSE CC (GAUZE/BANDAGES/DRESSINGS) IMPLANT
DRSG ADAPTIC 3X8 NADH LF (GAUZE/BANDAGES/DRESSINGS) ×3 IMPLANT
DRSG VERAFLO CLEANSE CC (GAUZE/BANDAGES/DRESSINGS) ×4
DURAPREP 26ML APPLICATOR (WOUND CARE) ×3 IMPLANT
ELECT REM PT RETURN 9FT ADLT (ELECTROSURGICAL)
ELECTRODE REM PT RTRN 9FT ADLT (ELECTROSURGICAL) IMPLANT
GAUZE SPONGE 4X4 12PLY STRL (GAUZE/BANDAGES/DRESSINGS) ×2 IMPLANT
GAUZE SPONGE 4X4 12PLY STRL LF (GAUZE/BANDAGES/DRESSINGS) ×2 IMPLANT
GLOVE BIOGEL PI IND STRL 9 (GLOVE) ×1 IMPLANT
GLOVE BIOGEL PI INDICATOR 9 (GLOVE) ×1
GLOVE SURG ORTHO 9.0 STRL STRW (GLOVE) ×2 IMPLANT
GOWN STRL REUS W/ TWL XL LVL3 (GOWN DISPOSABLE) ×2 IMPLANT
GOWN STRL REUS W/TWL XL LVL3 (GOWN DISPOSABLE) ×4
HANDPIECE INTERPULSE COAX TIP (DISPOSABLE) ×2
KIT BASIN OR (CUSTOM PROCEDURE TRAY) ×2 IMPLANT
KIT ROOM TURNOVER OR (KITS) ×2 IMPLANT
MANIFOLD NEPTUNE II (INSTRUMENTS) ×2 IMPLANT
NS IRRIG 1000ML POUR BTL (IV SOLUTION) ×2 IMPLANT
PACK ORTHO EXTREMITY (CUSTOM PROCEDURE TRAY) ×2 IMPLANT
PAD ABD 8X10 STRL (GAUZE/BANDAGES/DRESSINGS) ×2 IMPLANT
PAD ARMBOARD 7.5X6 YLW CONV (MISCELLANEOUS) ×4 IMPLANT
SET HNDPC FAN SPRY TIP SCT (DISPOSABLE) IMPLANT
SPONGE LAP 18X18 X RAY DECT (DISPOSABLE) ×1 IMPLANT
STOCKINETTE IMPERVIOUS 9X36 MD (GAUZE/BANDAGES/DRESSINGS) ×2 IMPLANT
SWAB COLLECTION DEVICE MRSA (MISCELLANEOUS) ×1 IMPLANT
SWAB CULTURE ESWAB REG 1ML (MISCELLANEOUS) IMPLANT
TOWEL OR 17X26 10 PK STRL BLUE (TOWEL DISPOSABLE) ×2 IMPLANT
TUBE CONNECTING 12X1/4 (SUCTIONS) ×2 IMPLANT
YANKAUER SUCT BULB TIP NO VENT (SUCTIONS) ×2 IMPLANT

## 2017-09-22 NOTE — Progress Notes (Signed)
Pharmacy Antibiotic Note  [name] is a 51 y.o. female  with gangrenous ulcers on both lowe extremities. Pharmacy has been consulted for zosyn and vancomycin dosing. -SCr= 1.45 on 11/20, CrCl ~ 40  Plan: -Zosyn 3.375gm IV q8h -Vancomycin 1000mg  IV followed by 750mg  IV q24h -Will follow renal function, cultures and clinical progress -BMET in am   Height: 5\' 1"  (154.9 cm) Weight: 130 lb (59 kg) IBW/kg (Calculated) : 47.8  Temp (24hrs), Avg:97.8 F (36.6 C), Min:97.7 F (36.5 C), Max:97.8 F (36.6 C)  No results for input(s): WBC, CREATININE, LATICACIDVEN, VANCOTROUGH, VANCOPEAK, VANCORANDOM, GENTTROUGH, GENTPEAK, GENTRANDOM, TOBRATROUGH, TOBRAPEAK, TOBRARND, AMIKACINPEAK, AMIKACINTROU, AMIKACIN in the last 168 hours.  Estimated Creatinine Clearance: 37 mL/min (A) (by C-G formula based on SCr of 1.45 mg/dL (H)).    No Known Allergies  Antimicrobials this admission: 11/28 vanc>> 11/28 zosyn>>  Dose adjustments this admission:   Microbiology results: 11/28 wound  Thank you for allowing pharmacy to be a part of this patient's care.  Hildred Laser, Pharm D 09/22/2017 5:12 PM

## 2017-09-22 NOTE — Transfer of Care (Signed)
Immediate Anesthesia Transfer of Care Note  Patient: [name]  Procedure(s) Performed: BILATERAL DEBRIDEMENT LEG/FOOT ULCERS, APPLY VERAFLO WOUND VAC (Bilateral Leg Lower)  Patient Location: PACU  Anesthesia Type:General  Level of Consciousness: awake, alert  and oriented  Airway & Oxygen Therapy: Patient Spontanous Breathing and Patient connected to nasal cannula oxygen  Post-op Assessment: Report given to RN and Post -op Vital signs reviewed and stable  Post vital signs: Reviewed and stable  Last Vitals:  Vitals:   09/22/17 1025 09/22/17 1132  BP: (!) 156/94   Pulse: 63 63  Resp: 19   Temp: 36.5 C   SpO2: 100%     Last Pain:  Vitals:   09/22/17 1139  TempSrc:   PainSc: 5       Patients Stated Pain Goal: 2 (16/38/45 3646)  Complications: No apparent anesthesia complications

## 2017-09-22 NOTE — Anesthesia Preprocedure Evaluation (Addendum)
Anesthesia Evaluation  Patient identified by MRN, date of birth, ID band Patient awake    Reviewed: Allergy & Precautions, NPO status , Patient's Chart, lab work & pertinent test results, reviewed documented beta blocker date and time   Airway Mallampati: II  TM Distance: >3 FB Neck ROM: Full    Dental  (+) Edentulous Upper, Edentulous Lower   Pulmonary sleep apnea , COPD, former smoker,    Pulmonary exam normal breath sounds clear to auscultation       Cardiovascular hypertension, Pt. on home beta blockers and Pt. on medications + Past MI, + Peripheral Vascular Disease and +CHF  Normal cardiovascular exam+ dysrhythmias (Paroxysmal a-tach) + Valvular Problems/Murmurs MR  Rhythm:Regular Rate:Normal  HLD  TTE 2018 - Moderate LVH. Ejection fraction   was in the range of 30% to 35%. Diffuse hypokinesis. Grade 2 diastolic dysfunction. Moderate MR, mildly dilated LA.  Carotid US 2018 - b/l 1-39% ICAS  Cath - No evidence of CAD   Neuro/Psych Anxiety Depression CVA, Residual Symptoms    GI/Hepatic negative GI ROS, Neg liver ROS,   Endo/Other  diabetes, Type 2  Renal/GU CRFRenal disease  negative genitourinary   Musculoskeletal  (+) Arthritis ,   Abdominal   Peds  Hematology  (+) anemia ,   Anesthesia Other Findings   Reproductive/Obstetrics                            Anesthesia Physical Anesthesia Plan  ASA: III  Anesthesia Plan: General   Post-op Pain Management:    Induction: Intravenous  PONV Risk Score and Plan: 4 or greater and Treatment may vary due to age or medical condition, Ondansetron, Midazolam and Scopolamine patch - Pre-op  Airway Management Planned: LMA  Additional Equipment: None  Intra-op Plan:   Post-operative Plan: Extubation in OR  Informed Consent: I have reviewed the patients History and Physical, chart, labs and discussed the procedure including the risks,  benefits and alternatives for the proposed anesthesia with the patient or authorized representative who has indicated his/her understanding and acceptance.   Dental advisory given  Plan Discussed with: CRNA  Anesthesia Plan Comments:         Anesthesia Quick Evaluation

## 2017-09-22 NOTE — Op Note (Signed)
09/22/2017  5:28 PM  PATIENT:  [name]    PRE-OPERATIVE DIAGNOSIS:  Gangrenous Ulcers Bilateral Feet/Legs  POST-OPERATIVE DIAGNOSIS:  Same  PROCEDURE:  BILATERAL DEBRIDEMENT LEG/FOOT ULCERS, APPLY VERAFLO WOUND VAC  SURGEON:  Newt Minion, MD  PHYSICIAN ASSISTANT:None ANESTHESIA:   General  PREOPERATIVE INDICATIONS:  [name] is a  51 y.o. female with a diagnosis of Gangrenous Ulcers Bilateral Feet/Legs who failed conservative measures and elected for surgical management.    The risks benefits and alternatives were discussed with the patient preoperatively including but not limited to the risks of infection, bleeding, nerve injury, cardiopulmonary complications, the need for revision surgery, among others, and the patient was willing to proceed.  OPERATIVE IMPLANTS: Instillation wound vacs x2  OPERATIVE FINDINGS: Necrotic tissue involving the majority of the dorsum of both feet.  The remainder of the ulcers were superficial.  OPERATIVE PROCEDURE: Patient was brought to the operating room and underwent a general anesthetic.  After adequate levels of anesthesia were obtained both lower extremities were prepped using DuraPrep draped into a sterile field a timeout was called.  Attention was first focused on the left lower extremity.  Patient had a large ulcer over the patella tendon.  A 21 blade knife was used to debride the skin and soft tissue and tendon back to healthy viable bleeding tissue.  There is no abscess no necrotic tissue no communication with the joint.  This wound was approximately 5 cm in diameter.  Patient also had a large ulcer over the dorsum of the left foot a 21 blade knife is also used to debride skin and soft tissue tendon and muscle this was excised.  This wound was approximately 6 cm in diameter.  Attention was then focused on the right lower extremity patient had 4 calf ulcers which are all about 4 cm in diameter these were debrided of skin and soft  tissue with a 21 blade knife the dorsum of the foot was then debrided with a 21 blade knife tendon was excised muscle skin and soft tissue this wound was 10 x 6 cm this did have petechial bleeding but the majority of the wound bed was marginally viable tissue.  A installation wound VAC was applied to both feet and the remainder of the wounds were covered with a sterile dressing with Adaptic 4 x 4's and a co-band from the foot up to the knee.  Patient tolerated procedure well she was extubated taken to the PACU in stable condition.  Plan to return to the operating room for further debridement and skin grafting.  Discussed with the family that if the wounds do not show improvement with the installation wound VAC on the feet she may require a transtibial amputation on one or both legs.

## 2017-09-22 NOTE — Anesthesia Procedure Notes (Signed)
Procedure Name: LMA Insertion Date/Time: 09/22/2017 1:17 PM Performed by: Imagene Riches, CRNA Pre-anesthesia Checklist: Patient identified, Emergency Drugs available, Suction available and Patient being monitored Patient Re-evaluated:Patient Re-evaluated prior to induction Oxygen Delivery Method: Circle System Utilized Preoxygenation: Pre-oxygenation with 100% oxygen Induction Type: IV induction Ventilation: Mask ventilation without difficulty LMA: LMA inserted LMA Size: 4.0 Number of attempts: 1 Airway Equipment and Method: Bite block Placement Confirmation: positive ETCO2 Tube secured with: Tape Dental Injury: Teeth and Oropharynx as per pre-operative assessment

## 2017-09-22 NOTE — Progress Notes (Signed)
CBG 74- patient asymptomatic.  Dr. Fransisco Beau was called and no new orders given.

## 2017-09-22 NOTE — H&P (Signed)
[name] is an 51 y.o. female.   Chief Complaint: open wounds bilateral lower extremities HPI: Patient is a 51 year old woman who was seen in referral from vascular vein surgery.  Patient has necrotic gangrenous ulcers both lower extremities she has adequate circulation bilaterally and is seen for evaluation and treatment of her ulcers.  Patient has a history of hypertension GI bleed congestive heart failure type 2 diabetes renal disease COPD history of cocaine abuse alcohol use Plavix and currently a smoker.    Past Medical History:  Diagnosis Date  . Alcohol use   . Ankle fracture, lateral malleolus, closed 12/30/2011  . Anxiety   . Breast mass, left 12/15/2011  . Chronic anemia   . Chronic combined systolic and diastolic CHF (congestive heart failure) ([place])   . CKD (chronic kidney disease), stage III ([place])    pt denies  . Cocaine abuse ([place])   . COPD (chronic obstructive pulmonary disease) ([place])   . Diabetes mellitus, type 2 ([place])   . Diabetic Charcot foot ([place]) 12/30/2011  . Essential hypertension   . History of GI bleed 12/05/2009   Qualifier: Diagnosis of  By: Zeb Comfort    . Hyperlipidemia   . Hypertensive cardiomyopathy ([place]) 11/08/2012   a. 05/2016: echo showing EF of 20-25% with normal cors by cath in 07/2016.  Marland Kitchen Noncompliance with medication regimen   . Obesity   . Panic attacks   . PAT (paroxysmal atrial tachycardia) ([place])    a. started on amiodarone 07/2017 for this.  . Sleep apnea    does not use cpap  . Stroke ([place])    06/24/17  . Tobacco abuse     Past Surgical History:  Procedure Laterality Date  . BREAST BIOPSY    . CARDIAC CATHETERIZATION N/A 07/28/2016   Procedure: Left Heart Cath and Coronary Angiography;  Surgeon: Jettie Booze, MD;  Location: [facility];  Service: Cardiovascular;  Laterality: N/A;  . DILATION AND CURETTAGE OF UTERUS      Family History  Problem Relation Age of Onset  . Hypertension Mother   . Heart attack  Mother   . Hypertension Father        CABG   . Hypertension Sister   . Hypertension Brother   . Hypertension Sister   . Cancer Sister        breast   . Arthritis Unknown   . Cancer Unknown   . Diabetes Unknown   . Asthma Unknown   . Hypertension Daughter   . Hypertension Son    Social History:  reports that she quit smoking about 3 months ago. Her smoking use included cigarettes. She has a 20.00 pack-year smoking history. she has never used smokeless tobacco. She reports that she uses drugs. Drugs: Marijuana and Cocaine. Frequency: 3.00 times per week. She reports that she does not drink alcohol.  Allergies: No Known Allergies  No medications prior to admission.    No results found for this or any previous visit (from the past 48 hour(s)). No results found.  Review of Systems  All other systems reviewed and are negative.   Last menstrual period 05/19/2016. Physical Exam   Assessment/Plan 1. Atherosclerosis of native arteries of extremities with gangrene, bilateral legs ([place])     Plan: Discussed with the patient that with her adequate macro circulation it would be worth an effort for limb salvage.  Discussed that we would debride ulcerations on both legs and both feet apply a vera flow wound  VAC with installation therapy anticipate approximately 1 week of instillation therapy and application of skin graft and topical wound VAC dressings.  Discussed that patient must completely stop smoking.  Discussed that with tobacco use and smoking patient will not heal the ulcers and would require a transtibial amputation.  Discussed the importance of proper diet with smoking cessation with greens beans, nut and fruit.  Recommend against any type of vaping products or topical nicotine patches.   Discussed with her poor glucose control the chance of healing is remote.      Newt Minion, MD 09/22/2017, 6:39 AM

## 2017-09-22 NOTE — Anesthesia Postprocedure Evaluation (Signed)
Anesthesia Post Note  Patient: [name]  Procedure(s) Performed: BILATERAL DEBRIDEMENT LEG/FOOT ULCERS, APPLY VERAFLO WOUND VAC (Bilateral Leg Lower)     Patient location during evaluation: PACU Anesthesia Type: General Level of consciousness: awake and alert Pain management: pain level controlled Vital Signs Assessment: post-procedure vital signs reviewed and stable Respiratory status: spontaneous breathing, nonlabored ventilation, respiratory function stable and patient connected to nasal cannula oxygen Cardiovascular status: blood pressure returned to baseline and stable Postop Assessment: no apparent nausea or vomiting Anesthetic complications: no    Last Vitals:  Vitals:   09/22/17 1545 09/22/17 1546  BP:  (!) 156/102  Pulse: 61 62  Resp: (!) 32 13  Temp:    SpO2: 99% 100%    Last Pain:  Vitals:   09/22/17 1520  TempSrc:   PainSc: [place]

## 2017-09-22 NOTE — Progress Notes (Signed)
HR 59 Coreg not given at this time pt states she took Coreg yesterday.

## 2017-09-22 NOTE — Progress Notes (Signed)
Received verbal order to give 25 mg Coreg and 50 mcg Fentanyl.  Patient's HR 63 and Dr. Fransisco Beau made aware.  Will continue to monitor patient.

## 2017-09-22 NOTE — Progress Notes (Signed)
CBG 78 Dr Fransisco Beau called and informed no new orders at this time.

## 2017-09-23 ENCOUNTER — Other Ambulatory Visit: Payer: Self-pay | Admitting: *Deleted

## 2017-09-23 ENCOUNTER — Encounter (HOSPITAL_COMMUNITY): Payer: Self-pay | Admitting: Orthopedic Surgery

## 2017-09-23 LAB — BASIC METABOLIC PANEL
Anion gap: 8 (ref 5–15)
BUN: 46 mg/dL — ABNORMAL HIGH (ref 6–20)
CO2: 21 mmol/L — ABNORMAL LOW (ref 22–32)
Calcium: 8.7 mg/dL — ABNORMAL LOW (ref 8.9–10.3)
Chloride: 106 mmol/L (ref 101–111)
Creatinine, Ser: 2.55 mg/dL — ABNORMAL HIGH (ref 0.44–1.00)
GFR calc Af Amer: 24 mL/min — ABNORMAL LOW (ref >60)
GFR calc non Af Amer: 20 mL/min — ABNORMAL LOW (ref >60)
Glucose, Bld: 195 mg/dL — ABNORMAL HIGH (ref 65–99)
Potassium: 5 mmol/L (ref 3.5–5.1)
Sodium: 135 mmol/L (ref 135–145)

## 2017-09-23 LAB — GLUCOSE, CAPILLARY
Glucose-Capillary: 186 mg/dL — ABNORMAL HIGH (ref 65–99)
Glucose-Capillary: 124 mg/dL — ABNORMAL HIGH (ref 65–99)
Glucose-Capillary: 62 mg/dL — ABNORMAL LOW (ref 65–99)
Glucose-Capillary: 32 mg/dL — CL (ref 65–99)
Glucose-Capillary: 41 mg/dL — CL (ref 65–99)
Glucose-Capillary: 121 mg/dL — ABNORMAL HIGH (ref 65–99)

## 2017-09-23 MED ORDER — VANCOMYCIN HCL IN DEXTROSE 1-5 GM/200ML-% IV SOLN
1000.0000 mg | INTRAVENOUS | Status: DC
  Administered 2017-09-24 – 2017-09-26 (×2): 1000 mg via INTRAVENOUS
  Filled 2017-09-23 (×2): qty 200

## 2017-09-23 MED ORDER — GLUCOSE 40 % PO GEL
ORAL | Status: DC
Start: 2017-09-23 — End: 2017-09-23
  Filled 2017-09-23: qty 1

## 2017-09-23 MED ORDER — DEXTROSE 50 % IV SOLN
INTRAVENOUS | Status: AC
  Administered 2017-09-24
  Filled 2017-09-23: qty 50

## 2017-09-23 MED ORDER — GLUCOSE 40 % PO GEL
ORAL | Status: AC
  Administered 2017-09-24
  Filled 2017-09-23: qty 1

## 2017-09-23 NOTE — Patient Outreach (Signed)
[facility]) Care Management  09/23/2017  [name] 05-19-1966 [id number]  [name] is day 1 status post debridement of multiple ischemic ulcers bilateral lower extremities with instillation wound vacs. As per Dr. Jess Barters orders/plans, [name] will remain hospitalized until she is transitioned to a skilled nursing facility for ongoing care. I will collaborate with our LCSW re: the plan of care and will close [name] case to community case management/nursing care.   Plan: Case Closure.    [facility]  [phone]

## 2017-09-23 NOTE — Progress Notes (Signed)
Pharmacy Antibiotic Note  [name] is a 51 y.o. female  with gangrenous ulcers on both lowe extremities. Pharmacy has been consulted for zosyn and vancomycin dosing. Afebrile, SCr 2.55 on admit, CrCl~21.  Plan: -Zosyn 3.375gm IV q8h -Adjust vancomycin to 1g IV q48h for current renal function -Monitor clinical progress, c/s, renal function -F/u de-escalation plan/LOT, vancomycin trough as indicated    Height: 5\' 1"  (154.9 cm) Weight: 130 lb (59 kg) IBW/kg (Calculated) : 47.8  Temp (24hrs), Avg:97.8 F (36.6 C), Min:97.6 F (36.4 C), Max:98 F (36.7 C)  Recent Labs  Lab 09/23/17 0554  CREATININE 2.55*    Estimated Creatinine Clearance: 21.1 mL/min (A) (by C-G formula based on SCr of 2.55 mg/dL (H)).    No Known Allergies  Antimicrobials this admission: 11/28 vanc>> 11/28 zosyn>>  Dose adjustments this admission:  Microbiology results: 11/28 wound:  Elicia Lamp, PharmD, BCPS Clinical Pharmacist Rx Phone # for today: [phone] After 3:30PM, please call Main Rx: [phone] 09/23/2017 9:23 AM

## 2017-09-23 NOTE — Progress Notes (Signed)
Patient ID: [name], female   DOB: 06-Mar-1966, 51 y.o.   MRN: [id number] Postoperative day 1 status post debridement of multiple ischemic ulcers bilateral lower extremities the instillation wound vacs are functioning well.  Will try to leave the instillation vacs in place as long as they are functioning well.  We will plan to change these out with return to the operating room Tuesday or Wednesday of next week.  Patient does not have the resources at home to care for herself and anticipate discharge to skilled nursing.

## 2017-09-23 NOTE — Progress Notes (Signed)
PT Cancellation Note  Patient Details Name: [name] MRN: [id number] DOB: Jul 01, 1966   Cancelled Treatment:    Reason Eval/Treat Not Completed: Patient declined, no reason specified;Pain limiting ability to participate. Pt engaged at bedside, explain reason for visit. She responds with immediate refusal to mobilize at this time in spite of multiple points of encouragement and attempted compromise. Pt reports pain is 15/10 (confirmed after explaining how NPRS works). Will attempt again at later date/time.   11:48 AM, 09/23/17 Etta Grandchild, PT, DPT Relief Physical Therapist - [place] [phone] (Pager)  [phone] [facility])  [phone] (Office)      Sigifredo Pignato C 09/23/2017, 11:47 AM

## 2017-09-24 ENCOUNTER — Other Ambulatory Visit (INDEPENDENT_AMBULATORY_CARE_PROVIDER_SITE_OTHER): Payer: Self-pay | Admitting: Family

## 2017-09-24 ENCOUNTER — Ambulatory Visit: Payer: Self-pay | Admitting: Surgery

## 2017-09-24 LAB — GLUCOSE, CAPILLARY
Glucose-Capillary: 52 mg/dL — ABNORMAL LOW (ref 65–99)
Glucose-Capillary: 76 mg/dL (ref 65–99)
Glucose-Capillary: 117 mg/dL — ABNORMAL HIGH (ref 65–99)
Glucose-Capillary: 112 mg/dL — ABNORMAL HIGH (ref 65–99)
Glucose-Capillary: 118 mg/dL — ABNORMAL HIGH (ref 65–99)

## 2017-09-24 NOTE — Progress Notes (Signed)
Inpatient Diabetes Program Recommendations  AACE/ADA: New Consensus Statement on Inpatient Glycemic Control (2015)  Target Ranges:  Prepandial:   less than 140 mg/dL      Peak postprandial:   less than 180 mg/dL (1-2 hours)      Critically ill patients:  140 - 180 mg/dL   Lab Results  Component Value Date   GLUCAP 76 09/24/2017   HGBA1C >15.5 (H) 06/25/2017    Review of Glycemic Control Results for [name], [name] (MRN [id number]) as of 09/24/2017 10:48  Ref. Range 09/23/2017 21:14 09/23/2017 21:42 09/23/2017 22:34 09/24/2017 06:15 09/24/2017 07:02  Glucose-Capillary Latest Ref Range: 65 - 99 mg/dL 32 (LL) 41 (LL) 121 (H) 52 (L) 76   Diabetes history: DM Type 2, recently stopped taking medications because she was "fed up" Outpatient Diabetes medications: none Current orders for Inpatient glycemic control: Novolog 0-15 Moderate correction, Novolog 4 Units tid with meals, Lantus 8 Units HS, tradjenta 5 mg po.  Inpatient Diabetes Program Recommendations:     Noted admin of IV dextrose related to low blood sugars. Would consider decreasing Novolog to a sensitive scale of 0-9 Units given creatinine and last recorded weight. Decrease Lantus to 5 Units HS. Also, consider decreasing Novolog meal coverage to 3 Units TID with meals.    Lastly, didn't see A1C recorded, may consider obtaining this lab value for baseline.   On another note, on 09/23/17 creatinine was 2.55 and BUN 46. This is up from 09/14/17 where creatinine was 1.45 and BUN was 24 (seems closer to her baseline). Could this be a variable to the hypoglycemic episodes?  Thanks, Bronson Curb, MSN, RNC-OB Diabetes Coordinator [phone] (8a-5p)

## 2017-09-24 NOTE — Consult Note (Signed)
   [facility] Inpatient Consult   09/24/2017  [name] December 02, 1965 [id number]  Patient previously  active with [facility] for chronic disease management services.  Patient has been engaged by a [employer] and CSW.  Our community based plan of care has focused on disease management and community resource support. Will follow up for disposition and community care management needs.  Of note, [facility] Care Management services does not replace or interfere with any services that are needed or arranged by inpatient case management or social work.  For additional questions or referrals please contact:  Natividad Brood, RN BSN [facility] Liaison  [phone] business mobile phone Toll free office [phone]

## 2017-09-24 NOTE — Progress Notes (Signed)
Patient ID: [name], female   DOB: April 11, 1966, 51 y.o.   MRN: [id number] Postoperative day 2 irrigation debridement multiple ischemic ulcers bilateral lower extremities.  Installation wound vacs on both feet functioning well.  Plan for return to the operating room on Tuesday for split-thickness skin graft.

## 2017-09-24 NOTE — Progress Notes (Signed)
Hypoglycemic Event  CBG: 52  Treatment: 15 GM carbohydrate snack  Symptoms: None  Follow-up CBG: Time:0702 CBG Result:76  Possible Reasons for Event: Unknown  Comments/MD notified:no    [name]

## 2017-09-24 NOTE — Progress Notes (Signed)
PT Cancellation Note  Patient Details Name: [name] MRN: [id number] DOB: 1966/07/24   Cancelled Treatment:    Reason Eval/Treat Not Completed: Patient continues to decline PT services at this time. Pt was educated on benefits of mobility and max encouragement was provided for pt to participate. She continues to refuse. Will check back as schedule allows to complete PT eval when pt is agreeable.    Thelma Comp 09/24/2017, 12:00 PM   Rolinda Roan, PT, DPT Acute Rehabilitation Services Pager: [phone]

## 2017-09-24 NOTE — Progress Notes (Signed)
Hypoglycemic Event  CBG: 41  Treatment: D50 IV 25 mL  Symptoms: Nervous/irritable  Follow-up CBG: Time:2234 CBG Result:121  Possible Reasons for Event: Inadequate meal intake  Comments/MD notified:no    [name]

## 2017-09-24 NOTE — Evaluation (Signed)
Physical Therapy Evaluation Patient Details Name: [name] MRN: [id number] DOB: 02-24-1966 Today's Date: 09/24/2017   History of Present Illness  Pt is a 51 y/o female who presents with multiple ischemic ulcers to bilateral LE's. She is s/p I&D of all wounds with wound vacs applied to bilateral feet. As of this date, the plan is for return to OR 11/4 for split-thickness skin graft. PMH significant for CVA, paroxysmal strial tachycardia, hypertensive cardiomyopathy, diabetic charcot foot, DM, COPD, CKD.  Clinical Impression  Pt admitted with above diagnosis. Pt currently with functional limitations due to the deficits listed below (see PT Problem List). At the time of PT eval pt was able to perform transfer to/from EOB with min to mod assist and increased time for all aspects. Pt very painful but overall put forth a good rehab effort. We discussed the options for follow up therapy, and pt reports she does not have good support at home. Pt agreed SNF would be her best option to maximize functional independence and return to PLOF. Pt will benefit from skilled PT to increase their independence and safety with mobility to allow discharge to the venue listed below.      Follow Up Recommendations SNF;Supervision/Assistance - 24 hour    Equipment Recommendations  Other (comment)(TBD by next venue of care and progress with PT)    Recommendations for Other Services       Precautions / Restrictions Precautions Precautions: Fall Restrictions Weight Bearing Restrictions: Yes RLE Weight Bearing: Weight bearing as tolerated LLE Weight Bearing: Weight bearing as tolerated Other Position/Activity Restrictions: Per orders, pt is WBAT, however PT is to instruct on transfer training only, not gait training.       Mobility  Bed Mobility Overal bed mobility: Needs Assistance Bed Mobility: Supine to Sit;Sit to Supine     Supine to sit: Min assist Sit to supine: Mod assist   General bed  mobility comments: Assist to advance LE's towards EOB. Pt was able to scoot out most of the way but therapist assisted with bed pad the rest of the way. To return to supine pt required mod assist for trunk and LE support.   Transfers                 General transfer comment: Unable at this time.   Ambulation/Gait             General Gait Details: Deferred per MD request.  Stairs            Wheelchair Mobility    Modified Rankin (Stroke Patients Only)       Balance Overall balance assessment: Needs assistance Sitting-balance support: Feet supported;No upper extremity supported Sitting balance-Leahy Scale: Fair                                       Pertinent Vitals/Pain Pain Assessment: Faces Faces Pain Scale: Hurts even more Pain Location: BLE's Pain Descriptors / Indicators: Operative site guarding;Cramping Pain Intervention(s): Limited activity within patient's tolerance;Monitored during session;Repositioned    Home Living Family/patient expects to be discharged to:: Skilled nursing facility Living Arrangements: Children Available Help at Discharge: Family;Available PRN/intermittently           Home Equipment: Wheelchair - manual      Prior Function Level of Independence: Independent with assistive device(s)         Comments: Some aspects of history were unclear as  pt was providing conflicting details.      Hand Dominance   Dominant Hand: Right    Extremity/Trunk Assessment   Upper Extremity Assessment Upper Extremity Assessment: Defer to OT evaluation    Lower Extremity Assessment Lower Extremity Assessment: RLE deficits/detail;LLE deficits/detail RLE Deficits / Details: Acute pain, decreased strength and AROM consistent with above mentioned procedures. LLE Deficits / Details: Acute pain, decreased strength and AROM consistent with above mentioned procedures.    Cervical / Trunk Assessment Cervical / Trunk  Assessment: (Difficult to assess this session)  Communication   Communication: No difficulties  Cognition Arousal/Alertness: Awake/alert Behavior During Therapy: WFL for tasks assessed/performed Overall Cognitive Status: Within Functional Limits for tasks assessed                                        General Comments      Exercises     Assessment/Plan    PT Assessment Patient needs continued PT services  PT Problem List Decreased strength;Decreased range of motion;Decreased activity tolerance;Decreased balance;Decreased mobility;Decreased knowledge of use of DME;Decreased safety awareness;Decreased knowledge of precautions;Pain       PT Treatment Interventions DME instruction;Gait training;Functional mobility training;Therapeutic activities;Therapeutic exercise;Neuromuscular re-education;Patient/family education;Wheelchair mobility training    PT Goals (Current goals can be found in the Care Plan section)  Acute Rehab PT Goals Patient Stated Goal: Get stronger at rehab prior to return home PT Goal Formulation: With patient Time For Goal Achievement: 10/08/17 Potential to Achieve Goals: Good    Frequency Min 3X/week   Barriers to discharge Decreased caregiver support      Co-evaluation               AM-PAC PT "6 Clicks" Daily Activity  Outcome Measure Difficulty turning over in bed (including adjusting bedclothes, sheets and blankets)?: Unable Difficulty moving from lying on back to sitting on the side of the bed? : Unable Difficulty sitting down on and standing up from a chair with arms (e.g., wheelchair, bedside commode, etc,.)?: Unable Help needed moving to and from a bed to chair (including a wheelchair)?: Total Help needed walking in hospital room?: Total Help needed climbing 3-5 steps with a railing? : Total 6 Click Score: 6    End of Session   Activity Tolerance: Patient limited by pain Patient left: in bed;with call bell/phone within  reach;with family/visitor present Nurse Communication: Mobility status;Other (comment)(Purewick needs to be replaced) PT Visit Diagnosis: Muscle weakness (generalized) (M62.81);Pain;Difficulty in walking, not elsewhere classified (R26.2) Pain - Right/Left: (Bilateral) Pain - part of body: Ankle and joints of foot;Leg    Time: 8366-2947 PT Time Calculation (min) (ACUTE ONLY): 31 min   Charges:   PT Evaluation $PT Eval Moderate Complexity: 1 Mod PT Treatments $Therapeutic Activity: 8-22 mins   PT G Codes:        Rolinda Roan, PT, DPT Acute Rehabilitation Services Pager: [phone]   Thelma Comp 09/24/2017, 2:25 PM

## 2017-09-24 NOTE — Progress Notes (Signed)
Hypoglycemic Event  CBG: 32  Treatment: 1 tube instant glucose  Symptoms: Nervous/irritable  Follow-up CBG: Time:2142 CBG Result:41  Possible Reasons for Event: Inadequate meal intake  Comments/MD notified:no    [name]

## 2017-09-25 LAB — GLUCOSE, CAPILLARY
Glucose-Capillary: 162 mg/dL — ABNORMAL HIGH (ref 65–99)
Glucose-Capillary: 271 mg/dL — ABNORMAL HIGH (ref 65–99)
Glucose-Capillary: 54 mg/dL — ABNORMAL LOW (ref 65–99)
Glucose-Capillary: 65 mg/dL (ref 65–99)
Glucose-Capillary: 69 mg/dL (ref 65–99)
Glucose-Capillary: 72 mg/dL (ref 65–99)
Glucose-Capillary: 85 mg/dL (ref 65–99)

## 2017-09-25 LAB — BASIC METABOLIC PANEL
Anion gap: 7 (ref 5–15)
BUN: 42 mg/dL — ABNORMAL HIGH (ref 6–20)
CO2: 23 mmol/L (ref 22–32)
Calcium: 8.6 mg/dL — ABNORMAL LOW (ref 8.9–10.3)
Chloride: 109 mmol/L (ref 101–111)
Creatinine, Ser: 2.24 mg/dL — ABNORMAL HIGH (ref 0.44–1.00)
GFR calc Af Amer: 28 mL/min — ABNORMAL LOW (ref >60)
GFR calc non Af Amer: 24 mL/min — ABNORMAL LOW (ref >60)
Glucose, Bld: 84 mg/dL (ref 65–99)
Potassium: 4.7 mmol/L (ref 3.5–5.1)
Sodium: 139 mmol/L (ref 135–145)

## 2017-09-25 MED ORDER — GLUCOSE 40 % PO GEL
ORAL | Status: AC
  Filled 2017-09-25: qty 1

## 2017-09-25 MED ORDER — GLUCOSE 40 % PO GEL
1.0000 | Freq: Once | ORAL | Status: AC
  Administered 2017-09-25: 37.5 g via ORAL

## 2017-09-25 NOTE — Clinical Social Work Note (Signed)
CSW went by to meet with patient but she was getting patient care. Per CSW handoff, "Per chart review in November, patient is out of SNF Medicare days. Her Medicaid is being reapplied for (runs out at the end of the month and she no longer has CAP services)."   CSW will follow up tomorrow.  Dayton Scrape, [place]

## 2017-09-25 NOTE — Progress Notes (Signed)
Subjective: 3 Days Post-Op Procedure(s) (LRB): BILATERAL DEBRIDEMENT LEG/FOOT ULCERS, APPLY VERAFLO WOUND VAC (Bilateral) Patient reports pain as mild.    Objective: Vital signs in last 24 hours: Temp:  [97.3 F (36.3 C)-98.1 F (36.7 C)] 98.1 F (36.7 C) (12/01 0425) Pulse Rate:  [62-70] 70 (12/01 0914) Resp:  [18] 18 (12/01 0914) BP: (105-129)/(64-92) 110/69 (12/01 0425) SpO2:  [97 %-100 %] 97 % (12/01 0914)  Intake/Output from previous day: 11/30 0701 - 12/01 0700 In: 551.7 [P.O.:360; I.V.:91.7; IV Piggyback:50] Out: 4200 [Urine:3900; Drains:300] Intake/Output this shift: Total I/O In: 120 [P.O.:120] Out: -   No results for input(s): HGB in the last 72 hours. No results for input(s): WBC, RBC, HCT, PLT in the last 72 hours. Recent Labs    09/23/17 0554 09/25/17 0500  NA 135 139  K 5.0 4.7  CL 106 109  CO2 21* 23  BUN 46* 42*  CREATININE 2.55* 2.24*  GLUCOSE 195* 84  CALCIUM 8.7* 8.6*   No results for input(s): LABPT, INR in the last 72 hours.  ABD soft Compartment soft  Assessment/Plan: 3 Days Post-Op Procedure(s) (LRB): BILATERAL DEBRIDEMENT LEG/FOOT ULCERS, APPLY VERAFLO WOUND VAC (Bilateral) OR Tuesday for STSG  Vonna Kotyk [facility] 09/25/2017, 11:08 AM

## 2017-09-26 LAB — GLUCOSE, CAPILLARY
Glucose-Capillary: 119 mg/dL — ABNORMAL HIGH (ref 65–99)
Glucose-Capillary: 200 mg/dL — ABNORMAL HIGH (ref 65–99)
Glucose-Capillary: 180 mg/dL — ABNORMAL HIGH (ref 65–99)
Glucose-Capillary: 86 mg/dL (ref 65–99)

## 2017-09-26 NOTE — Progress Notes (Signed)
2Subjective: 4 Days Post-Op Procedure(s) (LRB): BILATERAL DEBRIDEMENT LEG/FOOT ULCERS, APPLY VERAFLO WOUND VAC (Bilateral) Patient reports pain as moderate.    Objective: Vital signs in last 24 hours: Temp:  [98.1 F (36.7 C)-98.7 F (37.1 C)] 98.2 F (36.8 C) (12/02 0436) Pulse Rate:  [65-70] 70 (12/02 0836) Resp:  [13-18] 18 (12/02 0836) BP: (116-141)/(80-90) 139/80 (12/02 0839) SpO2:  [97 %-100 %] 97 % (12/02 0930)  Intake/Output from previous day: 12/01 0701 - 12/02 0700 In: 1450 [P.O.:840; I.V.:240; IV Piggyback:350] Out: 1450 [Urine:1450] Intake/Output this shift: Total I/O In: 240 [P.O.:240] Out: -   No results for input(s): HGB in the last 72 hours. No results for input(s): WBC, RBC, HCT, PLT in the last 72 hours. Recent Labs    09/25/17 0500  NA 139  K 4.7  CL 109  CO2 23  BUN 42*  CREATININE 2.24*  GLUCOSE 84  CALCIUM 8.6*   No results for input(s): LABPT, INR in the last 72 hours.  extremely sensitive bnilaterally and does not want to be touched.  wound vacs intact   Assessment/Plan: 4 Days Post-Op Procedure(s) (LRB): BILATERAL DEBRIDEMENT LEG/FOOT ULCERS, APPLY VERAFLO WOUND VAC (Bilateral) For STSG on Tuesday Continue IV Zosyn  Biagio Borg 09/26/2017, 10:46 AM

## 2017-09-26 NOTE — Progress Notes (Signed)
Hypoglycemic Event  CBG: 69 at 2325  Treatment: 1 tube of glucose gel  Symptoms: Nervous but alert and oriented with no acute distress  Follow-up CBG: Time:2245   CBG Result:271  Possible Reasons for Event: Inadequate meal intake  Comments/MD notified:no

## 2017-09-26 NOTE — Progress Notes (Signed)
Pharmacy Antibiotic Note  [name] is a 51 y.o. female  with gangrenous ulcers on both lowe extremities. Pharmacy has been consulted for zosyn and vancomycin dosing. Afebrile, SCr 2.55 on admit, CrCl~21.  Plan: Continue Vanc 1gm IV Q48H for goal trough 10-15 mcg/mL Continue Zosyn EI 3.375gm IV Q8H Monitor renal fxn, clinical progress, vanc trough at Css Consider narrowing to Rocephin   Height: 5\' 1"  (154.9 cm) Weight: 130 lb (59 kg) IBW/kg (Calculated) : 47.8  Temp (24hrs), Avg:98.3 F (36.8 C), Min:98.1 F (36.7 C), Max:98.7 F (37.1 C)  Recent Labs  Lab 09/23/17 0554 09/25/17 0500  CREATININE 2.55* 2.24*    Estimated Creatinine Clearance: 24 mL/min (A) (by C-G formula based on SCr of 2.24 mg/dL (H)).    No Known Allergies   Vanc 11/28 >> Zosyn 11/28 >>  11/28 wound cx - Citrobacter koseri   Giannis Corpuz D. Mina Marble, PharmD, BCPS Pager:  [phone] 09/26/2017, 9:15 AM

## 2017-09-27 ENCOUNTER — Ambulatory Visit: Payer: Medicare Other | Admitting: Internal Medicine

## 2017-09-27 ENCOUNTER — Encounter: Payer: Self-pay | Admitting: Internal Medicine

## 2017-09-27 DIAGNOSIS — E1165 Type 2 diabetes mellitus with hyperglycemia: Secondary | ICD-10-CM

## 2017-09-27 DIAGNOSIS — L089 Local infection of the skin and subcutaneous tissue, unspecified: Secondary | ICD-10-CM

## 2017-09-27 DIAGNOSIS — N183 Chronic kidney disease, stage 3 (moderate): Secondary | ICD-10-CM

## 2017-09-27 DIAGNOSIS — E11628 Type 2 diabetes mellitus with other skin complications: Secondary | ICD-10-CM

## 2017-09-27 DIAGNOSIS — E1122 Type 2 diabetes mellitus with diabetic chronic kidney disease: Secondary | ICD-10-CM

## 2017-09-27 DIAGNOSIS — E1121 Type 2 diabetes mellitus with diabetic nephropathy: Secondary | ICD-10-CM

## 2017-09-27 LAB — BASIC METABOLIC PANEL
Anion gap: 7 (ref 5–15)
BUN: 27 mg/dL — ABNORMAL HIGH (ref 6–20)
CO2: 24 mmol/L (ref 22–32)
Calcium: 8.7 mg/dL — ABNORMAL LOW (ref 8.9–10.3)
Chloride: 109 mmol/L (ref 101–111)
Creatinine, Ser: 1.58 mg/dL — ABNORMAL HIGH (ref 0.44–1.00)
GFR calc Af Amer: 42 mL/min — ABNORMAL LOW (ref >60)
GFR calc non Af Amer: 36 mL/min — ABNORMAL LOW (ref >60)
Glucose, Bld: 134 mg/dL — ABNORMAL HIGH (ref 65–99)
Potassium: 4.4 mmol/L (ref 3.5–5.1)
Sodium: 140 mmol/L (ref 135–145)

## 2017-09-27 LAB — AEROBIC CULTURE W GRAM STAIN (SUPERFICIAL SPECIMEN)

## 2017-09-27 LAB — GLUCOSE, CAPILLARY
Glucose-Capillary: 136 mg/dL — ABNORMAL HIGH (ref 65–99)
Glucose-Capillary: 149 mg/dL — ABNORMAL HIGH (ref 65–99)
Glucose-Capillary: 158 mg/dL — ABNORMAL HIGH (ref 65–99)
Glucose-Capillary: 186 mg/dL — ABNORMAL HIGH (ref 65–99)
Glucose-Capillary: 28 mg/dL — CL (ref 65–99)

## 2017-09-27 MED ORDER — DEXTROSE 50 % IV SOLN
INTRAVENOUS | Status: AC
  Administered 2017-09-27: 50 mL
  Filled 2017-09-27: qty 50

## 2017-09-27 MED ORDER — LEVOFLOXACIN IN D5W 750 MG/150ML IV SOLN
750.0000 mg | Freq: Once | INTRAVENOUS | Status: DC
  Filled 2017-09-27: qty 150

## 2017-09-27 MED ORDER — METRONIDAZOLE 500 MG PO TABS
500.0000 mg | ORAL_TABLET | Freq: Three times a day (TID) | ORAL | Status: DC
  Administered 2017-09-27: 500 mg via ORAL
  Filled 2017-09-27: qty 1

## 2017-09-27 MED ORDER — DEXTROSE 5 % IV SOLN
1.0000 g | INTRAVENOUS | Status: DC
  Administered 2017-09-27 – 2017-09-30 (×4): 1 g via INTRAVENOUS
  Filled 2017-09-27 (×5): qty 10

## 2017-09-27 MED ORDER — CHLORHEXIDINE GLUCONATE 4 % EX LIQD: 60.0000 mL | Freq: Once | CUTANEOUS | Status: DC

## 2017-09-27 MED ORDER — SODIUM CHLORIDE 0.9 % IV SOLN
1.0000 g | Freq: Four times a day (QID) | INTRAVENOUS | Status: DC
  Administered 2017-09-27 – 2017-10-01 (×14): 1 g via INTRAVENOUS
  Filled 2017-09-27 (×18): qty 1000

## 2017-09-27 MED ORDER — LEVOFLOXACIN 500 MG PO TABS: 750.0000 mg | ORAL_TABLET | ORAL | Status: DC

## 2017-09-27 NOTE — Progress Notes (Signed)
Hypoglycemic Event  CBG: 26  Treatment: D50 IV 50 mL  Symptoms: Sweaty and Sleepy  Follow-up CBG: Time:2335 CBG Result:149  Possible Reasons for Event: Inadequate meal intake  Comments/MD notified:Duda, gave amp of D50, started d5 @ 50 ml, as per protocol    Ulanda Edison

## 2017-09-27 NOTE — Progress Notes (Signed)
Patient ID: [name], female   DOB: 05-04-1966, 51 y.o.   MRN: [id number] Patient alert this AM, plan for STSG tomorrow, discussed risk of ultimately requiring BKA due to PVD, NPO after MN, instillation VAC working well

## 2017-09-27 NOTE — Progress Notes (Addendum)
Pharmacy Antibiotic Note  [name] is a 51 y.o. female  with gangrenous ulcers on both lower extremities. Pharmacy has been consulted to narrow antibiotics to Levaquin for Citrobacter and Enterococcus growing in wound culture.  Patient's renal function is improving.  She remains afebrile and her WBC is WNL.  QTc < 500 ms.   Plan: Levaquin 750mg  IV x 1, then 750mg  PO Q48H Flagyl 500mg  PO Q8H Monitor renal fxn, micro data   Height: 5\' 1"  (154.9 cm) Weight: 130 lb (59 kg) IBW/kg (Calculated) : 47.8  Temp (24hrs), Avg:98.8 F (37.1 C), Min:98.3 F (36.8 C), Max:99.1 F (37.3 C)  Recent Labs  Lab 09/23/17 0554 09/25/17 0500 09/27/17 0532  CREATININE 2.55* 2.24* 1.58*    Estimated Creatinine Clearance: 34 mL/min (A) (by C-G formula based on SCr of 1.58 mg/dL (H)).    No Known Allergies  Vanc 11/28 >> 12/3 Zosyn 11/28 >> 12/3 LVQ 12/3 >> Flagyl 12/3 >>  11/28 wound cx - Citrobacter koseri (pan sensitive) + E.faecalis    Aowyn Rozeboom D. Mina Marble, PharmD, BCPS Pager:  [phone] 09/27/2017, 8:37 AM   =======================  Addendum: Culture finalized as VRE.  Spoke to Dr. Sharol Given, change LVQ to CTX to cover Citrobacter.  Will need Zyvox and Dr. Sharol Given to contact ID for a consult.  Could likely d/c Flagyl once Zyvox started.   Kyrus Hyde D. Mina Marble, PharmD, BCPS Pager:  [phone] 09/27/2017, 2:52 PM

## 2017-09-27 NOTE — H&P (View-Only) (Signed)
Patient ID: [name], female   DOB: 1966/07/27, 51 y.o.   MRN: [id number] Patient alert this AM, plan for STSG tomorrow, discussed risk of ultimately requiring BKA due to PVD, NPO after MN, instillation VAC working well

## 2017-09-27 NOTE — Care Management Important Message (Signed)
Important Message  Patient Details  Name: [name] MRN: [id number] Date of Birth: 08-05-1966   Medicare Important Message Given:  Yes    Khyree Carillo 09/27/2017, 2:04 PM

## 2017-09-27 NOTE — Progress Notes (Signed)
PT Cancellation Note  Patient Details Name: [name] MRN: [id number] DOB: 17-Nov-1965   Cancelled Treatment:     attempted to work with patient at 1210 today. Patient adamantly denying physical mobility citing not wishing to irritate legs before procedure scheduled tomorrow. Despite encouragement for low level activity, pt firmly denies wishing to work with therapy today until after her skin grafts are performed. Will continue to follow and check in 12/4.  Reinaldo Berber, PT, DPT Acute Rehab Services Pager: [phone]    Reinaldo Berber 09/27/2017, 12:34 PM

## 2017-09-27 NOTE — Patient Outreach (Signed)
[facility]) Care Management  09/27/2017  [name] 05/07/1966 [id number]   CSW spoke with patient's sister, [name] who informed CSW that patient's DSS caseworker is now [name] [phone]. CSW called & spoke with Dentia who states that she has received the application but has requested additional information (balance direct express card, bill of sale on property sold, recepits on how money was spent down, medical bills) along with forms to sign & return. CSW informed patient's sister of requested information and provided her with phone number to [facility] [phone] to provide medical bills.   CSW also touched base with inpatient CSW, Elissa Hefty [phone] to inform that patient had been at [facility] 9/5 - 10/6 (31 days) and then [facility] 10/12 - 11/2 (21 days) for a total of 52 days used, leaving her with 48 Medicare days left, though she is in her copay days for SNF - she & her family are working with DSS on getting Medicaid application approved. CSW informed inpatient CSW that patient had been requesting to go to [facility] and CSW has been in touch with Tammy at [facility] to give updates on patient.    Raynaldo Opitz, LCSW [facility]  Clinical Social Worker cell #: [phone]

## 2017-09-27 NOTE — Clinical Social Work Note (Signed)
Clinical Social Work Assessment  Patient Details  Name: [name] MRN: [id number] Date of Birth: 05/18/1966  Date of referral:  09/27/17               Reason for consult:  Facility Placement                Permission sought to share information with:  Facility Art therapist granted to share information::  Yes, Verbal Permission Granted  Name::        Agency::  SNF  Relationship::     Contact Information:     Housing/Transportation Living arrangements for the past 2 months:  No permanent address(Stays with family) Source of Information:  Patient Patient Interpreter Needed:  None Criminal Activity/Legal Involvement Pertinent to Current Situation/Hospitalization:  No - Comment as needed Significant Relationships:  Adult Children, Siblings Lives with:  Relatives Do you feel safe going back to the place where you live?  Yes Need for family participation in patient care:  No (Coment)  Care giving concerns:  Pt shared that she resides with family members. She sold her property to assist to get rid of that burden and her inability to manage. She has family support. She indicated that she has medicare and medicaid and her medicaid will end and she completed application application again but it is pending. Pt unable to be cared for by brother at his home and will need SNF placement. Pt indicated that she is interested in [facility] and [facility] for placement.  CSW received a phone call from Raynaldo Opitz at [place] advising that she has been following patient in the community for support. Claiborne Billings advised that patient has some SNF days available.  Social Worker assessment / plan:  CSW met with patient at bedside and discussed SNF placement. Pt concerned about medicaid process and CSW validated. CSW confirmed that patient has reapplied for medicaid as patient indicated family was f/u on proof that she no longer has the property that she had before.   CSW will  assist with disposition to SNF. FL2 pending as procedure scheduled for 12/4. CSW will continue to follow.  Employment status:  Disabled (Comment on whether or not currently receiving Disability) Insurance information:  Managed Medicare PT Recommendations:  [place] / Referral to community resources:  [place]  Patient/Family's Response to care:  Patient appreciative of CSW assistance with SNF placement. No issues or concerns identified at this time.  Patient/Family's Understanding of and Emotional Response to Diagnosis, Current Treatment, and Prognosis:  Patient has good understanding of diagnosis, current treatment and prognosis as she is aware of SNF she wants to go to and identifies that she will need SNF at discharge. Pt has family involved in her care and is assisting with psychosocial needs as she reports. No issues or concerns identified at this time.  Emotional Assessment Appearance:  Appears stated age Attitude/Demeanor/Rapport:  (Cooperative) Affect (typically observed):  Accepting, Appropriate Orientation:  Oriented to Situation, Oriented to  Time, Oriented to Place, Oriented to Self Alcohol / Substance use:  Not Applicable Psych involvement (Current and /or in the community):  No (Comment)  Discharge Needs  Concerns to be addressed:  Discharge Planning Concerns Readmission within the last 30 days:  Yes Current discharge risk:  Physical Impairment, Dependent with Mobility Barriers to Discharge:  No Barriers Identified   Normajean Baxter, LCSW 09/27/2017, 3:53 PM

## 2017-09-27 NOTE — Consult Note (Signed)
[facility] Infectious Disease    Date of Admission:  09/22/2017     Total days of antibiotics 5   Ceftriaxone 12/3   Vancomycin + Zosyn 11/29 - 1202               Reason for Consult: Gangrenous ulcers of bilateral lower extremities Referring Provider: Sharol Given Primary Care Provider: [name], MD   Assessment/Plan: [name] is a 51 y.o. diabetic female with multiple bilateral chronic ischemic ulcerations to lower extremities.  1. Diabetic Foot Infection: last A1C > 15.5%.   Agree with ceftriaxone. Will add ampicillin to cover VRE and some anaerobes. Zyvox would cover well also however she is at risk for serotonin syndrome with concurrent use of SSRIs. Also, linezolid can lower platelets and can be a significant expense for patients.   Length of therapy to be determined after surgery tomorrow as she may result in bilateral trans tibial amputation.   2. A/C CKD: creatinine elevated 2.5 with admission >> down to 1.58 today. Previously noted to be 1.5 - 2.0 in chart.  Continue to limit nephrotoxic agents.   Glycemic control per primary team.   She needs improved control of BP as well.   3. Tobacco Abuse: current everyday smoker.   Needs to stop smoking to help with her overall healing and risk for amputation.   4. Diabetes Mellitus, Uncontrolled: HgbA1C 15.5% in August.   Outpatient follow up needed for control.   She needs ophtho eval as outpt as well.    HPI: [name] is a 51 y.o. female with A PMH of tobacco use, stroke, diabetic Charcot foot, Type 2 DM, COPD, CKD Stage III, and cocaine abuse who was admitted to the hospital on 11/28 with necrotic gangrenous ulcers to her bilateral lower extremities.   Active Problems:   Wound, open, knee, lower leg, or ankle with complication, right, initial encounter  Hospital Course: Outpatient was referred to Dr. Sharol Given from vascular team due to necrotic/gangrenous ulcers on b/l lower extremities.  Vascular circulation is adequate based on recent evaluation - ABI 08/04/17 reveal monophasic signal on right indicating arterial occlusive disease and left resting normal although potentially falsely elevated. She was admitted 09/22/2017 by Dr. Sharol Given for limb-salvaging debridement efforts to b/l legs and feet. She had debridement in OR with vac application on 55/97.   Op Note 11/28: LLE >> large ulceration over patella tendon 5 cm in diameter; large ulcer over dorsum of left foot 6 cm in diameter; RLE >> 4 calf ulcers ~4cm in diameter each,  Excised to 10 x 6 cm wound with some petechial bleeding noted however majority of wound bed with only marginally viable tissue. Wound Cx from Right Foot Ulcer >> few VRE (S-ampicillin, gent, linezolid) and Citrobacter koseri (pan-sensitive). She has been afebrile throughout hospital stay. Last WBC 11/20 normal 8.3.   Plan is to go back to the OR tomorrow for further debridement and skin grafting to these multiple areas. She is at high risk for bilateral transtibial amputation.     Marland Kitchen amiodarone  200 mg Oral BID  . carvedilol  25 mg Oral BID WC  . chlorhexidine  60 mL Topical Once  . clopidogrel  75 mg Oral Q breakfast  . docusate sodium  100 mg Oral BID  . ezetimibe  10 mg Oral Daily  . FLUoxetine  20 mg Oral Daily  . gabapentin  100 mg Oral TID  . hydrALAZINE  25 mg Oral  TID  . insulin aspart  0-15 Units Subcutaneous TID WC  . insulin aspart  4 Units Subcutaneous TID WC  . metroNIDAZOLE  500 mg Oral Q8H  . montelukast  10 mg Oral QHS  . pantoprazole  40 mg Oral Daily  . potassium chloride SA  20 mEq Oral Daily  . rosuvastatin  40 mg Oral q1800  . tiotropium  18 mcg Inhalation Daily   Review of Systems: Review of Systems  Constitutional: Negative for chills and fever.  HENT: Negative for tinnitus.   Eyes: Negative for blurred vision and photophobia.  Respiratory: Negative for cough and sputum production.   Cardiovascular: Negative for chest pain.    Gastrointestinal: Negative for diarrhea, nausea and vomiting.  Genitourinary: Negative for dysuria.  Skin: Negative for rash.  Neurological: Negative for headaches.  Please see HPI. All other systems reviewed and negative.   Past Medical History:  Diagnosis Date  . Alcohol use   . Ankle fracture, lateral malleolus, closed 12/30/2011  . Anxiety   . Breast mass, left 12/15/2011  . Chronic anemia   . Chronic combined systolic and diastolic CHF (congestive heart failure) ([place])   . CKD (chronic kidney disease), stage III ([place])    pt denies  . Cocaine abuse ([place])   . COPD (chronic obstructive pulmonary disease) ([place])   . Diabetes mellitus, type 2 ([place])   . Diabetic Charcot foot ([place]) 12/30/2011  . Essential hypertension   . History of GI bleed 12/05/2009   Qualifier: Diagnosis of  By: Zeb Comfort    . Hyperlipidemia   . Hypertensive cardiomyopathy ([place]) 11/08/2012   a. 05/2016: echo showing EF of 20-25% with normal cors by cath in 07/2016.  Marland Kitchen Noncompliance with medication regimen   . Obesity   . Panic attacks   . PAT (paroxysmal atrial tachycardia) ([place])    a. started on amiodarone 07/2017 for this.  . Sleep apnea    does not use cpap  . Stroke ([place])    06/24/17  . Tobacco abuse     Social History   Tobacco Use  . Smoking status: Former Smoker    Packs/day: 1.00    Years: 20.00    Pack years: 20.00    Types: Cigarettes    Last attempt to quit: 06/03/2017    Years since quitting: 0.3  . Smokeless tobacco: Never Used  Substance Use Topics  . Alcohol use: No    Alcohol/week: 0.0 oz    Comment: Quit in 2017  . Drug use: Yes    Frequency: 3.0 times per week    Types: Marijuana, Cocaine    Comment: none since August 2018    Family History  Problem Relation Age of Onset  . Hypertension Mother   . Heart attack Mother   . Hypertension Father        CABG   . Hypertension Sister   . Hypertension Brother   . Hypertension Sister   . Cancer Sister        breast   .  Arthritis Unknown   . Cancer Unknown   . Diabetes Unknown   . Asthma Unknown   . Hypertension Daughter   . Hypertension Son     No Known Allergies  OBJECTIVE: Blood pressure (!) 170/93, pulse 65, temperature (!) 97.2 F (36.2 C), temperature source Oral, resp. rate 18, height 5\' 1"  (1.549 m), weight 130 lb (59 kg), last menstrual period 05/19/2016, SpO2 100 %.  Physical Exam  Lab Results Lab Results  Component Value Date   WBC 8.3 09/14/2017   HGB 10.8 (L) 09/14/2017   HCT 35.8 (L) 09/14/2017   MCV 97.3 09/14/2017   PLT 398 09/14/2017    Lab Results  Component Value Date   CREATININE 1.58 (H) 09/27/2017   BUN 27 (H) 09/27/2017   NA 140 09/27/2017   K 4.4 09/27/2017   CL 109 09/27/2017   CO2 24 09/27/2017    Lab Results  Component Value Date   ALT 17 09/14/2017   AST 20 09/14/2017   ALKPHOS 42 09/14/2017   BILITOT 0.6 09/14/2017     Microbiology: Recent Results (from the past 240 hour(s))  Anaerobic culture     Status: None (Preliminary result)   Collection Time: 09/22/17  1:39 PM  Result Value Ref Range Status   Specimen Description TISSUE RIGHT FOOT ULCER  Final   Special Requests NONE  Final   Culture HOLDING FOR POSSIBLE ANAEROBE  Final   Report Status PENDING  Incomplete  Aerobic Culture (superficial specimen)     Status: None   Collection Time: 09/22/17  1:39 PM  Result Value Ref Range Status   Specimen Description TISSUE RIGHT FOOT ULCER  Final   Special Requests NONE  Final   Gram Stain   Final    ABUNDANT WBC PRESENT, PREDOMINANTLY PMN MODERATE GRAM POSITIVE COCCI IN CLUSTERS MODERATE GRAM NEGATIVE RODS    Culture   Final    MODERATE CITROBACTER [place] RESISTANT ENTEROCOCCUS    Report Status 09/27/2017 FINAL  Final   Organism ID, Bacteria CITROBACTER KOSERI  Final   Organism ID, Bacteria VANCOMYCIN RESISTANT ENTEROCOCCUS  Final      Susceptibility   Citrobacter koseri - MIC*    CEFAZOLIN <=4 SENSITIVE Sensitive      CEFEPIME <=1 SENSITIVE Sensitive     CEFTAZIDIME <=1 SENSITIVE Sensitive     CEFTRIAXONE <=1 SENSITIVE Sensitive     CIPROFLOXACIN <=0.25 SENSITIVE Sensitive     GENTAMICIN <=1 SENSITIVE Sensitive     IMIPENEM <=0.25 SENSITIVE Sensitive     TRIMETH/SULFA <=20 SENSITIVE Sensitive     PIP/TAZO 16 SENSITIVE Sensitive     * MODERATE CITROBACTER KOSERI   Vancomycin resistant enterococcus - MIC*    AMPICILLIN <=2 SENSITIVE Sensitive     VANCOMYCIN >=32 RESISTANT Resistant     GENTAMICIN SYNERGY SENSITIVE Sensitive     LINEZOLID 2 SENSITIVE Sensitive     * FEW VANCOMYCIN RESISTANT ENTEROCOCCUS    Barbara Madeira, MSN, NP-C [facility] Cell: [phone] Pager: [phone]  09/27/2017  3:19 PM

## 2017-09-28 ENCOUNTER — Inpatient Hospital Stay (HOSPITAL_COMMUNITY): Payer: Medicare Other | Admitting: Certified Registered Nurse Anesthetist

## 2017-09-28 ENCOUNTER — Encounter (HOSPITAL_COMMUNITY)
Admission: RE | Disposition: A | Discharge status: Skilled Nursing Facility | Payer: Self-pay | Source: Ambulatory Visit | Attending: Orthopedic Surgery

## 2017-09-28 ENCOUNTER — Encounter (HOSPITAL_COMMUNITY): Payer: Self-pay | Admitting: Anesthesiology

## 2017-09-28 ENCOUNTER — Telehealth: Payer: Self-pay | Admitting: Family Medicine

## 2017-09-28 DIAGNOSIS — E1129 Type 2 diabetes mellitus with other diabetic kidney complication: Secondary | ICD-10-CM

## 2017-09-28 DIAGNOSIS — F419 Anxiety disorder, unspecified: Secondary | ICD-10-CM

## 2017-09-28 DIAGNOSIS — J449 Chronic obstructive pulmonary disease, unspecified: Secondary | ICD-10-CM

## 2017-09-28 DIAGNOSIS — I10 Essential (primary) hypertension: Secondary | ICD-10-CM

## 2017-09-28 DIAGNOSIS — I160 Hypertensive urgency: Secondary | ICD-10-CM | POA: Diagnosis present

## 2017-09-28 DIAGNOSIS — F329 Major depressive disorder, single episode, unspecified: Secondary | ICD-10-CM

## 2017-09-28 HISTORY — PX: SKIN SPLIT GRAFT: SHX444

## 2017-09-28 LAB — GLUCOSE, CAPILLARY
Glucose-Capillary: 196 mg/dL — ABNORMAL HIGH (ref 65–99)
Glucose-Capillary: 202 mg/dL — ABNORMAL HIGH (ref 65–99)
Glucose-Capillary: 147 mg/dL — ABNORMAL HIGH (ref 65–99)
Glucose-Capillary: 141 mg/dL — ABNORMAL HIGH (ref 65–99)
Glucose-Capillary: 139 mg/dL — ABNORMAL HIGH (ref 65–99)
Glucose-Capillary: 215 mg/dL — ABNORMAL HIGH (ref 65–99)

## 2017-09-28 SURGERY — APPLICATION, GRAFT, SKIN, SPLIT-THICKNESS
Anesthesia: General | Laterality: Bilateral

## 2017-09-28 MED ORDER — MIDAZOLAM HCL 5 MG/5ML IJ SOLN
INTRAMUSCULAR | Status: DC | PRN
  Administered 2017-09-28: 2 mg via INTRAVENOUS

## 2017-09-28 MED ORDER — POLYETHYLENE GLYCOL 3350 17 G PO PACK: 17.0000 g | PACK | Freq: Every day | ORAL | Status: DC | PRN

## 2017-09-28 MED ORDER — PROPOFOL 10 MG/ML IV BOLUS
INTRAVENOUS | Status: AC
  Filled 2017-09-28: qty 20

## 2017-09-28 MED ORDER — CEFAZOLIN SODIUM-DEXTROSE 2-4 GM/100ML-% IV SOLN
2.0000 g | INTRAVENOUS | Status: AC
  Administered 2017-09-28: 2 g via INTRAVENOUS
  Filled 2017-09-28: qty 100

## 2017-09-28 MED ORDER — METHOCARBAMOL 500 MG PO TABS
500.0000 mg | ORAL_TABLET | Freq: Four times a day (QID) | ORAL | Status: DC | PRN
  Administered 2017-09-28 – 2017-09-30 (×4): 500 mg via ORAL
  Filled 2017-09-28 (×4): qty 1

## 2017-09-28 MED ORDER — NEOSTIGMINE METHYLSULFATE 5 MG/5ML IV SOSY
PREFILLED_SYRINGE | INTRAVENOUS | Status: AC
  Filled 2017-09-28: qty 5

## 2017-09-28 MED ORDER — ONDANSETRON HCL 4 MG/2ML IJ SOLN: 4.0000 mg | Freq: Four times a day (QID) | INTRAMUSCULAR | Status: DC | PRN

## 2017-09-28 MED ORDER — METHOCARBAMOL 1000 MG/10ML IJ SOLN
500.0000 mg | Freq: Four times a day (QID) | INTRAMUSCULAR | Status: DC | PRN
Start: 2017-09-28 — End: 2017-10-01
  Filled 2017-09-28: qty 5

## 2017-09-28 MED ORDER — METOCLOPRAMIDE HCL 5 MG PO TABS: 5.0000 mg | ORAL_TABLET | Freq: Three times a day (TID) | ORAL | Status: DC | PRN

## 2017-09-28 MED ORDER — NALOXONE HCL 0.4 MG/ML IJ SOLN
INTRAMUSCULAR | Status: AC
  Filled 2017-09-28: qty 1

## 2017-09-28 MED ORDER — ACETAMINOPHEN 325 MG PO TABS: 650.0000 mg | ORAL_TABLET | ORAL | Status: DC | PRN

## 2017-09-28 MED ORDER — MIDAZOLAM HCL 2 MG/2ML IJ SOLN
INTRAMUSCULAR | Status: AC
  Filled 2017-09-28: qty 2

## 2017-09-28 MED ORDER — HYDROMORPHONE HCL 1 MG/ML IJ SOLN: 0.2500 mg | INTRAMUSCULAR | Status: DC | PRN

## 2017-09-28 MED ORDER — ONDANSETRON HCL 4 MG PO TABS: 4.0000 mg | ORAL_TABLET | Freq: Four times a day (QID) | ORAL | Status: DC | PRN

## 2017-09-28 MED ORDER — ONDANSETRON HCL 4 MG/2ML IJ SOLN
INTRAMUSCULAR | Status: AC
  Filled 2017-09-28: qty 6

## 2017-09-28 MED ORDER — HYDRALAZINE HCL 20 MG/ML IJ SOLN
10.0000 mg | INTRAMUSCULAR | Status: DC | PRN
  Administered 2017-09-29: 10 mg via INTRAVENOUS
  Filled 2017-09-28: qty 1

## 2017-09-28 MED ORDER — ONDANSETRON HCL 4 MG/2ML IJ SOLN
INTRAMUSCULAR | Status: DC | PRN
  Administered 2017-09-28: 4 mg via INTRAVENOUS

## 2017-09-28 MED ORDER — MAGNESIUM CITRATE PO SOLN: 1.0000 | Freq: Once | ORAL | Status: DC | PRN

## 2017-09-28 MED ORDER — PROMETHAZINE HCL 25 MG/ML IJ SOLN: 6.2500 mg | INTRAMUSCULAR | Status: DC | PRN

## 2017-09-28 MED ORDER — FENTANYL CITRATE (PF) 250 MCG/5ML IJ SOLN
INTRAMUSCULAR | Status: AC
  Filled 2017-09-28: qty 5

## 2017-09-28 MED ORDER — MUPIROCIN CALCIUM 2 % EX CREA
TOPICAL_CREAM | CUTANEOUS | Status: AC
  Filled 2017-09-28: qty 15

## 2017-09-28 MED ORDER — EPHEDRINE 5 MG/ML INJ
INTRAVENOUS | Status: AC
  Filled 2017-09-28: qty 10

## 2017-09-28 MED ORDER — LIDOCAINE 2% (20 MG/ML) 5 ML SYRINGE
INTRAMUSCULAR | Status: DC | PRN
  Administered 2017-09-28: 60 mg via INTRAVENOUS

## 2017-09-28 MED ORDER — SODIUM CHLORIDE 0.9 % IV SOLN: INTRAVENOUS | Status: DC

## 2017-09-28 MED ORDER — BISACODYL 10 MG RE SUPP: 10.0000 mg | Freq: Every day | RECTAL | Status: DC | PRN

## 2017-09-28 MED ORDER — ENSURE ENLIVE PO LIQD
237.0000 mL | Freq: Two times a day (BID) | ORAL | Status: DC
  Administered 2017-09-28 – 2017-10-01 (×7): 237 mL via ORAL

## 2017-09-28 MED ORDER — DOCUSATE SODIUM 100 MG PO CAPS: 100.0000 mg | ORAL_CAPSULE | Freq: Two times a day (BID) | ORAL | Status: DC

## 2017-09-28 MED ORDER — LIDOCAINE 2% (20 MG/ML) 5 ML SYRINGE
INTRAMUSCULAR | Status: AC
  Filled 2017-09-28: qty 20

## 2017-09-28 MED ORDER — METOCLOPRAMIDE HCL 5 MG/ML IJ SOLN: 5.0000 mg | Freq: Three times a day (TID) | INTRAMUSCULAR | Status: DC | PRN

## 2017-09-28 MED ORDER — METOPROLOL TARTRATE 5 MG/5ML IV SOLN
INTRAVENOUS | Status: DC | PRN
  Administered 2017-09-28: 1 mg via INTRAVENOUS
  Administered 2017-09-28: 2 mg via INTRAVENOUS

## 2017-09-28 MED ORDER — HYDROMORPHONE HCL 1 MG/ML IJ SOLN
INTRAMUSCULAR | Status: AC
  Administered 2017-09-28: 0.5 mg via INTRAVENOUS
  Filled 2017-09-28: qty 1

## 2017-09-28 MED ORDER — 0.9 % SODIUM CHLORIDE (POUR BTL) OPTIME
TOPICAL | Status: DC | PRN
  Administered 2017-09-28: 1000 mL

## 2017-09-28 MED ORDER — ACETAMINOPHEN 650 MG RE SUPP
650.0000 mg | RECTAL | Status: DC | PRN
Start: 2017-09-28 — End: 2017-09-28

## 2017-09-28 MED ORDER — LABETALOL HCL 5 MG/ML IV SOLN
INTRAVENOUS | Status: DC | PRN
  Administered 2017-09-28 (×2): 5 mg via INTRAVENOUS

## 2017-09-28 MED ORDER — PROPOFOL 10 MG/ML IV BOLUS
INTRAVENOUS | Status: DC | PRN
  Administered 2017-09-28: 140 mg via INTRAVENOUS

## 2017-09-28 MED ORDER — LACTATED RINGERS IV SOLN
INTRAVENOUS | Status: DC
  Administered 2017-09-28: 16:00:00 via INTRAVENOUS

## 2017-09-28 MED ORDER — MINERAL OIL LIGHT 100 % EX OIL
TOPICAL_OIL | CUTANEOUS | Status: AC
  Filled 2017-09-28: qty 25

## 2017-09-28 MED ORDER — SODIUM CHLORIDE 0.9 % IR SOLN
Status: DC | PRN
  Administered 2017-09-28: 3000 mL

## 2017-09-28 MED ORDER — FENTANYL CITRATE (PF) 100 MCG/2ML IJ SOLN
INTRAMUSCULAR | Status: DC | PRN
  Administered 2017-09-28 (×6): 50 ug via INTRAVENOUS

## 2017-09-28 MED ORDER — DEXAMETHASONE SODIUM PHOSPHATE 10 MG/ML IJ SOLN
INTRAMUSCULAR | Status: DC | PRN
  Administered 2017-09-28: 5 mg via INTRAVENOUS

## 2017-09-28 MED ORDER — INSULIN ASPART 100 UNIT/ML ~~LOC~~ SOLN
0.0000 [IU] | Freq: Three times a day (TID) | SUBCUTANEOUS | Status: DC
  Administered 2017-09-29: 2 [IU] via SUBCUTANEOUS
  Administered 2017-09-29 – 2017-09-30 (×2): 3 [IU] via SUBCUTANEOUS
  Administered 2017-09-30 – 2017-10-01 (×3): 2 [IU] via SUBCUTANEOUS

## 2017-09-28 MED ORDER — INSULIN ASPART 100 UNIT/ML ~~LOC~~ SOLN
3.0000 [IU] | Freq: Three times a day (TID) | SUBCUTANEOUS | Status: DC
  Administered 2017-09-29 – 2017-10-01 (×6): 3 [IU] via SUBCUTANEOUS

## 2017-09-28 MED ORDER — HYDROMORPHONE HCL 1 MG/ML IJ SOLN
0.2500 mg | INTRAMUSCULAR | Status: DC | PRN
Start: 2017-09-28 — End: 2017-09-28
  Administered 2017-09-28 (×2): 0.5 mg via INTRAVENOUS

## 2017-09-28 SURGICAL SUPPLY — 49 items
ALLOGRAFT SKIN MESHD 125 SQ CM (Tissue) ×2 IMPLANT
BNDG CMPR 9X4 STRL LF SNTH (GAUZE/BANDAGES/DRESSINGS) ×1
BNDG COHESIVE 6X5 TAN STRL LF (GAUZE/BANDAGES/DRESSINGS) IMPLANT
BNDG ESMARK 4X9 LF (GAUZE/BANDAGES/DRESSINGS) ×2 IMPLANT
BNDG GAUZE STRTCH 6 (GAUZE/BANDAGES/DRESSINGS) IMPLANT
CANISTER WOUNDNEG PRESSURE 500 (CANNISTER) ×1 IMPLANT
COVER SURGICAL LIGHT HANDLE (MISCELLANEOUS) ×4 IMPLANT
CUFF TOURNIQUET SINGLE 18IN (TOURNIQUET CUFF) IMPLANT
CUFF TOURNIQUET SINGLE 24IN (TOURNIQUET CUFF) IMPLANT
DERMACARRIERS GRAFT 1 TO 1.5 (DISPOSABLE)
DRAPE INCISE IOBAN 66X45 STRL (DRAPES) ×3 IMPLANT
DRAPE U-SHAPE 47X51 STRL (DRAPES) ×2 IMPLANT
DRESSING VERAFLO CLEANSE CC (GAUZE/BANDAGES/DRESSINGS) IMPLANT
DRSG ADAPTIC 3X8 NADH LF (GAUZE/BANDAGES/DRESSINGS) IMPLANT
DRSG MEPITEL 4X7.2 (GAUZE/BANDAGES/DRESSINGS) ×1 IMPLANT
DRSG VERAFLO CLEANSE CC (GAUZE/BANDAGES/DRESSINGS) ×4
DURAPREP 26ML APPLICATOR (WOUND CARE) ×1 IMPLANT
ELECT REM PT RETURN 9FT ADLT (ELECTROSURGICAL) ×2
ELECTRODE REM PT RTRN 9FT ADLT (ELECTROSURGICAL) ×1 IMPLANT
GAUZE SPONGE 4X4 12PLY STRL (GAUZE/BANDAGES/DRESSINGS) IMPLANT
GLOVE BIOGEL PI IND STRL 9 (GLOVE) ×1 IMPLANT
GLOVE BIOGEL PI INDICATOR 9 (GLOVE) ×1
GLOVE SURG ORTHO 9.0 STRL STRW (GLOVE) ×2 IMPLANT
GOWN STRL REUS W/ TWL XL LVL3 (GOWN DISPOSABLE) ×2 IMPLANT
GOWN STRL REUS W/TWL XL LVL3 (GOWN DISPOSABLE) ×4
GRAFT DERMACARRIERS 1 TO 1.5 (DISPOSABLE) IMPLANT
GRAFT TISS MESH 125 BURN (Tissue) IMPLANT
KIT BASIN OR (CUSTOM PROCEDURE TRAY) ×2 IMPLANT
KIT ROOM TURNOVER OR (KITS) ×2 IMPLANT
MANIFOLD NEPTUNE II (INSTRUMENTS) ×2 IMPLANT
NDL HYPO 25GX1X1/2 BEV (NEEDLE) IMPLANT
NEEDLE HYPO 25GX1X1/2 BEV (NEEDLE) IMPLANT
NS IRRIG 1000ML POUR BTL (IV SOLUTION) ×2 IMPLANT
PACK ORTHO EXTREMITY (CUSTOM PROCEDURE TRAY) ×2 IMPLANT
PAD ARMBOARD 7.5X6 YLW CONV (MISCELLANEOUS) ×4 IMPLANT
PAD CAST 4YDX4 CTTN HI CHSV (CAST SUPPLIES) IMPLANT
PAD NEG PRESSURE SENSATRAC (MISCELLANEOUS) ×2 IMPLANT
PADDING CAST COTTON 4X4 STRL (CAST SUPPLIES)
SKIN MESHED 125 SQ CM (Tissue) ×4 IMPLANT
STAPLER VISISTAT 35W (STAPLE) ×3 IMPLANT
SUCTION FRAZIER HANDLE 10FR (MISCELLANEOUS)
SUCTION TUBE FRAZIER 10FR DISP (MISCELLANEOUS) IMPLANT
SUT ETHILON 4 0 PS 2 18 (SUTURE) IMPLANT
SYR CONTROL 10ML LL (SYRINGE) IMPLANT
TOWEL OR 17X24 6PK STRL BLUE (TOWEL DISPOSABLE) ×2 IMPLANT
TOWEL OR 17X26 10 PK STRL BLUE (TOWEL DISPOSABLE) ×2 IMPLANT
TUBE CONNECTING 12X1/4 (SUCTIONS) IMPLANT
WATER STERILE IRR 1000ML POUR (IV SOLUTION) ×1 IMPLANT
WND VAC CANISTER 500ML (MISCELLANEOUS) ×1 IMPLANT

## 2017-09-28 NOTE — Progress Notes (Signed)
Patient has had elevated BP's throughout surgery and on arrival to PACU.  Received multiple doses of Metoprolol and Labetalol from CRNA.  Patient was medicated for pain with Dilaudid and now resting comfortably.  BP remains 190/95.  Dr. Tobias Alexander was made aware, no new orders, states patient may return to room.

## 2017-09-28 NOTE — Telephone Encounter (Signed)
I have not received anything.

## 2017-09-28 NOTE — Interval H&P Note (Signed)
History and Physical Interval Note:  09/28/2017 6:22 AM  [name]  has presented today for surgery, with the diagnosis of Wounds Bilateral Lower Extremities  The various methods of treatment have been discussed with the patient and family. After consideration of risks, benefits and other options for treatment, the patient has consented to  Procedure(s): BILATERAL SPLIT THICKNESS SKIN GRAFT LEGS/FEET AND APPLY VAC (Bilateral) as a surgical intervention .  The patient's history has been reviewed, patient examined, no change in status, stable for surgery.  I have reviewed the patient's chart and labs.  Questions were answered to the patient's satisfaction.     Newt Minion

## 2017-09-28 NOTE — Telephone Encounter (Signed)
Sonny Dandy (relative she says) called in stating that she has filed for FMLA to take care of patient, she spoke w/ a Rep. This morning that stated this claim may be denied. She is wanting to know if we have received fmla paperwork to be filled out by fax, and if there is anything else we can do.  Cb# [phone]

## 2017-09-28 NOTE — Progress Notes (Signed)
PT Cancellation Note  Patient Details Name: LISIA WESTBAY MRN: [id number] DOB: 1966/08/31   Cancelled Treatment:    Reason Eval/Treat Not Completed: Patient declined, no reason specified;Pain limiting ability to participate Patient adamantly declined mobilizing today. PT will continue to follow acutely.    Salina April, PTA Pager: [phone]   09/28/2017, 11:47 AM

## 2017-09-28 NOTE — Progress Notes (Signed)
Report  Given to OR

## 2017-09-28 NOTE — Progress Notes (Signed)
INFECTIOUS DISEASE PROGRESS NOTE  ID: [name] is a 51 y.o. female with  Active Problems:   Wound, open, knee, lower leg, or ankle with complication, right, initial encounter  Subjective: C/o tenderness in feet.   Abtx:  Anti-infectives (From admission, onward)   Start     Dose/Rate Route Frequency Ordered Stop   09/29/17 0900  levofloxacin (LEVAQUIN) tablet 750 mg  Status:  Discontinued     750 mg Oral Every 48 hours 09/27/17 0838 09/27/17 1258   09/27/17 1800  ampicillin (OMNIPEN) 1 g in sodium chloride 0.9 % 50 mL IVPB     1 g 150 mL/hr over 20 Minutes Intravenous Every 6 hours 09/27/17 1603     09/27/17 1300  cefTRIAXone (ROCEPHIN) 1 g in dextrose 5 % 50 mL IVPB     1 g 100 mL/hr over 30 Minutes Intravenous Every 24 hours 09/27/17 1258     09/27/17 0845  levofloxacin (LEVAQUIN) IVPB 750 mg  Status:  Discontinued     750 mg 100 mL/hr over 90 Minutes Intravenous  Once 09/27/17 0838 09/27/17 1258   09/27/17 0845  metroNIDAZOLE (FLAGYL) tablet 500 mg  Status:  Discontinued     500 mg Oral Every 8 hours 09/27/17 0844 09/27/17 1603   09/24/17 1900  vancomycin (VANCOCIN) IVPB 1000 mg/200 mL premix  Status:  Discontinued     1,000 mg 200 mL/hr over 60 Minutes Intravenous Every 48 hours 09/23/17 0924 09/27/17 0835   09/23/17 1800  vancomycin (VANCOCIN) IVPB 750 mg/150 ml premix  Status:  Discontinued     750 mg 150 mL/hr over 60 Minutes Intravenous Every 24 hours 09/22/17 1717 09/23/17 0924   09/23/17 0000  piperacillin-tazobactam (ZOSYN) IVPB 3.375 g  Status:  Discontinued     3.375 g 12.5 mL/hr over 240 Minutes Intravenous Every 8 hours 09/22/17 1717 09/27/17 0835   09/22/17 1800  piperacillin-tazobactam (ZOSYN) IVPB 3.375 g     3.375 g 100 mL/hr over 30 Minutes Intravenous  Once 09/22/17 1717 09/22/17 2145   09/22/17 1800  vancomycin (VANCOCIN) IVPB 1000 mg/200 mL premix     1,000 mg 200 mL/hr over 60 Minutes Intravenous  Once 09/22/17 1717 09/22/17 2145   09/22/17  1030  ceFAZolin (ANCEF) IVPB 2g/100 mL premix     2 g 200 mL/hr over 30 Minutes Intravenous On call to O.R. 09/22/17 1020 09/22/17 1314      Medications:  Scheduled: . amiodarone  200 mg Oral BID  . carvedilol  25 mg Oral BID WC  . chlorhexidine  60 mL Topical Once  . clopidogrel  75 mg Oral Q breakfast  . docusate sodium  100 mg Oral BID  . ezetimibe  10 mg Oral Daily  . feeding supplement (ENSURE ENLIVE)  237 mL Oral BID BM & HS  . FLUoxetine  20 mg Oral Daily  . gabapentin  100 mg Oral TID  . hydrALAZINE  25 mg Oral TID  . insulin aspart  0-15 Units Subcutaneous TID WC  . insulin aspart  4 Units Subcutaneous TID WC  . montelukast  10 mg Oral QHS  . pantoprazole  40 mg Oral Daily  . potassium chloride SA  20 mEq Oral Daily  . rosuvastatin  40 mg Oral q1800  . tiotropium  18 mcg Inhalation Daily    Objective: Vital signs in last 24 hours: Temp:  [97.2 F (36.2 C)-98 F (36.7 C)] 98 F (36.7 C) (12/04 0755) Pulse Rate:  [47-71] 71 (12/04  2841) Resp:  [16] 16 (12/04 0755) BP: (120-174)/(81-93) 174/91 (12/04 0755) SpO2:  [99 %-100 %] 100 % (12/04 0755)   General appearance: alert, cooperative and no distress Extremities: vac in place, both feet. slight tenderness.   Lab Results Recent Labs    09/27/17 0532  NA 140  K 4.4  CL 109  CO2 24  BUN 27*  CREATININE 1.58*   Liver Panel No results for input(s): PROT, ALBUMIN, AST, ALT, ALKPHOS, BILITOT, BILIDIR, IBILI in the last 72 hours. Sedimentation Rate No results for input(s): ESRSEDRATE in the last 72 hours. C-Reactive Protein No results for input(s): CRP in the last 72 hours.  Microbiology: Recent Results (from the past 240 hour(s))  Anaerobic culture     Status: None (Preliminary result)   Collection Time: 09/22/17  1:39 PM  Result Value Ref Range Status   Specimen Description TISSUE RIGHT FOOT ULCER  Final   Special Requests NONE  Final   Culture HOLDING FOR POSSIBLE ANAEROBE  Final   Report Status  PENDING  Incomplete  Aerobic Culture (superficial specimen)     Status: None   Collection Time: 09/22/17  1:39 PM  Result Value Ref Range Status   Specimen Description TISSUE RIGHT FOOT ULCER  Final   Special Requests NONE  Final   Gram Stain   Final    ABUNDANT WBC PRESENT, PREDOMINANTLY PMN MODERATE GRAM POSITIVE COCCI IN CLUSTERS MODERATE GRAM NEGATIVE RODS    Culture   Final    MODERATE CITROBACTER KOSERI FEW VANCOMYCIN RESISTANT ENTEROCOCCUS    Report Status 09/27/2017 FINAL  Final   Organism ID, Bacteria CITROBACTER KOSERI  Final   Organism ID, Bacteria VANCOMYCIN RESISTANT ENTEROCOCCUS  Final      Susceptibility   Citrobacter koseri - MIC*    CEFAZOLIN <=4 SENSITIVE Sensitive     CEFEPIME <=1 SENSITIVE Sensitive     CEFTAZIDIME <=1 SENSITIVE Sensitive     CEFTRIAXONE <=1 SENSITIVE Sensitive     CIPROFLOXACIN <=0.25 SENSITIVE Sensitive     GENTAMICIN <=1 SENSITIVE Sensitive     IMIPENEM <=0.25 SENSITIVE Sensitive     TRIMETH/SULFA <=20 SENSITIVE Sensitive     PIP/TAZO 16 SENSITIVE Sensitive     * MODERATE CITROBACTER KOSERI   Vancomycin resistant enterococcus - MIC*    AMPICILLIN <=2 SENSITIVE Sensitive     VANCOMYCIN >=32 RESISTANT Resistant     GENTAMICIN SYNERGY SENSITIVE Sensitive     LINEZOLID 2 SENSITIVE Sensitive     * FEW VANCOMYCIN RESISTANT ENTEROCOCCUS    Studies/Results: No results found.   Assessment/Plan: Diabetic foot ulcers (VRE, citrobacter)  Pending- Debridement, Skin grafting (12-4) CKD DM2  Total days of antibiotics:6  Ceftriaxone/ampicillin  Would continue her anbx for 4 weeks if she has above surgery If amputation, 5 days post-op.           Bobby Rumpf MD, FACP Infectious Diseases (pager) [phone] [email or web address] 09/28/2017, 12:56 PM  LOS: 6 days

## 2017-09-28 NOTE — Progress Notes (Signed)
[facility] Infusion Coordinator will follow pt with Dr. Johnnye Sima and the ID team to support IV ABX at DC as ordered/needed.  If patient discharges after hours, please call [phone].   Larry Sierras 09/28/2017, 9:47 PM

## 2017-09-28 NOTE — Anesthesia Preprocedure Evaluation (Signed)
Anesthesia Evaluation    Reviewed: Allergy & Precautions, Patient's Chart, lab work & pertinent test results, reviewed documented beta blocker date and time   Airway Mallampati: II  TM Distance: >3 FB Neck ROM: Full    Dental  (+) Edentulous Upper, Edentulous Lower   Pulmonary sleep apnea , COPD, former smoker,    Pulmonary exam normal breath sounds clear to auscultation       Cardiovascular hypertension, Pt. on home beta blockers and Pt. on medications + Past MI, + Peripheral Vascular Disease and +CHF  Normal cardiovascular exam+ dysrhythmias (Paroxysmal a-tach) + Valvular Problems/Murmurs MR  Rhythm:Regular Rate:Normal  HLD  TTE 2018 - Moderate LVH. Ejection fraction   was in the range of 30% to 35%. Diffuse hypokinesis. Grade 2 diastolic dysfunction. Moderate MR, mildly dilated LA.  Carotid US 2018 - b/l 1-39% ICAS  Cath - No evidence of CAD   Neuro/Psych Anxiety Depression CVA, Residual Symptoms    GI/Hepatic Neg liver ROS, GERD  Medicated,  Endo/Other  diabetes, Type 2, Insulin Dependent  Renal/GU CRFRenal disease  negative genitourinary   Musculoskeletal  (+) Arthritis , Osteoarthritis,    Abdominal   Peds  Hematology  (+) anemia ,   Anesthesia Other Findings   Reproductive/Obstetrics                             Anesthesia Physical  Anesthesia Plan  ASA: III  Anesthesia Plan: General   Post-op Pain Management:    Induction: Intravenous  PONV Risk Score and Plan: 4 or greater and Treatment may vary due to age or medical condition, Ondansetron, Midazolam and Scopolamine patch - Pre-op  Airway Management Planned: LMA  Additional Equipment: None  Intra-op Plan:   Post-operative Plan: Extubation in OR  Informed Consent: I have reviewed the patients History and Physical, chart, labs and discussed the procedure including the risks, benefits and alternatives for the proposed  anesthesia with the patient or authorized representative who has indicated his/her understanding and acceptance.   Dental advisory given  Plan Discussed with: CRNA  Anesthesia Plan Comments:         Anesthesia Quick Evaluation

## 2017-09-28 NOTE — Progress Notes (Signed)
Inpatient Diabetes Program Recommendations  AACE/ADA: New Consensus Statement on Inpatient Glycemic Control (2015)  Target Ranges:  Prepandial:   less than 140 mg/dL      Peak postprandial:   less than 180 mg/dL (1-2 hours)      Critically ill patients:  140 - 180 mg/dL   Lab Results  Component Value Date   GLUCAP 147 (H) 09/28/2017   HGBA1C >15.5 (H) 06/25/2017    Review of Glycemic Control  Diabetes history: DM Type 2, "fed up with taking meds" Outpatient Diabetes medications: Lantus 8 Units HS, Tradjenta 5mg  PO QD Current orders for Inpatient glycemic control: Novolog 0-15 Units Moderate Correction, Novolog 4 Units TID  Inpatient Diabetes Program Recommendations:    Noted low blood sugar of 28mg /dL following correction where patient ate  < 50% and current NPO status,  would recommend decreasing correction to sensitive scale Novolog 0-9 Units tid.  Thanks, Bronson Curb, MSN, RNC-OB Diabetes Coordinator [phone] (8a-5p)

## 2017-09-28 NOTE — Progress Notes (Signed)
When patient arrived to 5N26, BP was 215/120.  Patient comfortable.  Dr. Marlou Sa (on call) was notified and will consult medicine team to see patient.

## 2017-09-28 NOTE — Plan of Care (Signed)
  Progressing Health Behavior/Discharge Planning: Ability to manage health-related needs will improve 09/28/2017 1214 - Progressing by Rance Muir, RN Clinical Measurements: Ability to maintain clinical measurements within normal limits will improve 09/28/2017 1214 - Progressing by Rance Muir, RN Will remain free from infection 09/28/2017 1214 - Progressing by Rance Muir, RN Diagnostic test results will improve 09/28/2017 1214 - Progressing by Rance Muir, RN Respiratory complications will improve 09/28/2017 1214 - Progressing by Rance Muir, RN Cardiovascular complication will be avoided 09/28/2017 1214 - Progressing by Rance Muir, RN Activity: Risk for activity intolerance will decrease 09/28/2017 1214 - Progressing by Rance Muir, RN Nutrition: Adequate nutrition will be maintained 09/28/2017 1214 - Progressing by Rance Muir, RN Coping: Level of anxiety will decrease 09/28/2017 1214 - Progressing by Rance Muir, RN Elimination: Will not experience complications related to bowel motility 09/28/2017 1214 - Progressing by Rance Muir, RN Will not experience complications related to urinary retention 09/28/2017 1214 - Progressing by Rance Muir, RN Pain Managment: General experience of comfort will improve 09/28/2017 1214 - Progressing by Rance Muir, RN Skin Integrity: Risk for impaired skin integrity will decrease 09/28/2017 1214 - Progressing by Rance Muir, RN Clinical Measurements: Ability to maintain clinical measurements within normal limits will improve 09/28/2017 1214 - Progressing by Rance Muir, RN Postoperative complications will be avoided or minimized 09/28/2017 1214 - Progressing by Rance Muir, RN Skin Integrity: Demonstration of wound healing without infection will improve 09/28/2017 1214 - Progressing by Rance Muir, RN

## 2017-09-28 NOTE — Op Note (Signed)
09/28/2017  6:13 PM  PATIENT:  [name]    PRE-OPERATIVE DIAGNOSIS:  Wounds Bilateral Lower Extremities  POST-OPERATIVE DIAGNOSIS:  Same  PROCEDURE:  BILATERAL SPLIT THICKNESS SKIN GRAFT LEGS/FEET AND APPLY VAC  SURGEON:  Newt Minion, MD  PHYSICIAN ASSISTANT:None ANESTHESIA:   General  PREOPERATIVE INDICATIONS:  [name] is a  51 y.o. female with a diagnosis of Wounds Bilateral Lower Extremities who failed conservative measures and elected for surgical management.    The risks benefits and alternatives were discussed with the patient preoperatively including but not limited to the risks of infection, bleeding, nerve injury, cardiopulmonary complications, the need for revision surgery, among others, and the patient was willing to proceed.  OPERATIVE IMPLANTS: Allograft split-thickness skin graft 125 cm x3 units,  OPERATIVE FINDINGS: Good petechial bleeding on all wounds which were previously necrotic  OPERATIVE PROCEDURE: Patient was brought to the operating room and underwent a general anesthetic.  After adequate levels of anesthesia were obtained patient's both lower extremities were prepped using Betadine paint and draped into a sterile field a timeout was called.  A pulsatile lavage was used to debride all the wounds.  A rondure was used to remove exposed extensor tendons in both feet.  A rondure was used to further debride the other wounds of fibrinous exudate of tissue fascia and necrotic tissue.  All wounds were bleeding and had good granulation tissue after debridement.  The split-thickness skin graft was then applied to the wounds there was a wound over the patella tendon on the left which had excellent granulation tissue in the wound dorsally on the left foot which also had excellent granulation tissue.  The cleanse choice reticulated foam was placed over the wounds this was covered with the non reticulated foam and a bridge was placed this had a good suction fit.   Attention was then focused on the right side.  Skin graft was applied to the 3 wounds on the posterior calf as well as the dorsum of the right foot.  These were secured with staples all skin graft was secured these were then covered with the reticulated foam followed by the non-reticulated foam and a bridge was placed from the calf wounds to the foot wound this was covered with [place] this also had a good suction fit.  Patient was extubated taken to PACU in stable condition.  Total wound surface area 375 cm.   DISCHARGE PLANNING:  Antibiotic duration: 3 days postoperatively.  Weightbearing: Weightbearing as tolerated bilateral lower extremities.  Pain medication: Continue current care.  Dressing care/ Wound VAC: Wound vacs for 5 days.  Ambulatory devices: Walker.  Discharge to: Skilled nursing facility.  Follow-up: In the office 1 week post operative.

## 2017-09-28 NOTE — Transfer of Care (Signed)
Immediate Anesthesia Transfer of Care Note  Patient: [name]  Procedure(s) Performed: BILATERAL SPLIT THICKNESS SKIN GRAFT LEGS/FEET AND APPLY VAC (Bilateral )  Patient Location: PACU  Anesthesia Type:General  Level of Consciousness: awake, alert , oriented and patient cooperative  Airway & Oxygen Therapy: Patient Spontanous Breathing  Post-op Assessment: Report given to RN and Post -op Vital signs reviewed and stable  Post vital signs: Reviewed and stable  Last Vitals:  Vitals:   09/28/17 1605 09/28/17 1756  BP: (!) 168/106 (!) 191/115  Pulse:  76  Resp:  16  Temp:    SpO2:  99%    Last Pain:  Vitals:   09/28/17 1357  TempSrc: Oral  PainSc:       Patients Stated Pain Goal: 2 (01/75/10 2585)  Complications: No apparent anesthesia complications

## 2017-09-28 NOTE — Anesthesia Postprocedure Evaluation (Signed)
Anesthesia Post Note  Patient: ENNIFER HARSTON  Procedure(s) Performed: BILATERAL SPLIT THICKNESS SKIN GRAFT LEGS/FEET AND APPLY VAC (Bilateral )     Patient location during evaluation: PACU Anesthesia Type: General Level of consciousness: sedated Pain management: pain level controlled Vital Signs Assessment: post-procedure vital signs reviewed and stable Respiratory status: spontaneous breathing and respiratory function stable Cardiovascular status: stable Postop Assessment: no apparent nausea or vomiting Anesthetic complications: no    Last Vitals:  Vitals:   09/28/17 1910 09/28/17 1939  BP: (!) 194/98 (!) 190/95  Pulse: 67 66  Resp: 14 12  Temp:    SpO2: 99% 96%    Last Pain:  Vitals:   09/28/17 1939  TempSrc:   PainSc: Asleep                 Ercelle Winkles DANIEL

## 2017-09-28 NOTE — Anesthesia Procedure Notes (Signed)
Procedure Name: LMA Insertion Date/Time: 09/28/2017 4:58 PM Performed by: White, Amedeo Plenty, CRNA Pre-anesthesia Checklist: Patient identified, Emergency Drugs available, Suction available and Patient being monitored Patient Re-evaluated:Patient Re-evaluated prior to induction Oxygen Delivery Method: Circle System Utilized Preoxygenation: Pre-oxygenation with 100% oxygen Induction Type: IV induction Ventilation: Mask ventilation without difficulty LMA: LMA inserted LMA Size: 4.0 Number of attempts: 1 Airway Equipment and Method: Bite block Placement Confirmation: positive ETCO2 Tube secured with: Tape Dental Injury: Teeth and Oropharynx as per pre-operative assessment

## 2017-09-28 NOTE — Consult Note (Addendum)
Medical Consultation   [name]  QQV:[id number]  DOB: 06/22/1966  DOA: 09/22/2017  PCP: [name], MD   Outpatient Specialists: Dr. Sharol Given, orthopedic surgery   Requesting physician: Dr. Marlou Sa, orthopedic surgery  Reason for consultation: Hypertensive urgency    History of Present Illness: [name] is an 51 y.o. female alcohol and cocaine abuse in remission, malignant hypertension, chronic kidney disease stage III, COPD, insulin-dependent diabetes mellitus, and bilateral lower extremity diabetic foot wounds with necrosis, status post bilateral debridement a week ago and placement of skin grafts this evening, now noted to be markedly hypertensive.  Patient has uncontrolled diabetes with most recent A1c >15.5%, and has been followed by orthopedic surgery for management of chronic foot wounds bilaterally.  She was seen by vascular surgery, noted to have adequate circulation, underwent I&D bilaterally on 09/22/2017, is being treated with antibiotics at the direction of infectious disease consultant, then had skin grafting this evening.  She was reportedly hypertensive in the OR/PACU and given multiple doses of labetalol and Lopressor.  When she arrived back to the floor blood pressure was reportedly 215/120.  On-call orthopedist was notified and requests a medical consultation for further evaluation and management of this.    Patient complains of some pain in the bilateral feet, described as severe, throbbing, localized, and constant.  She denies any dyspnea, cough, headache, change in vision or hearing, focal numbness or weakness, chest pain, or palpitations.  She takes Coreg and hydralazine at home for her hypertension.  She had a hypoglycemic episode last night with CBG 28, and some lower values the day prior, but her hospital course is otherwise been largely uncomplicated.   Review of Systems:  As per HPI otherwise 10 point review of systems negative.     Past Medical History: Past Medical History:  Diagnosis Date  . Alcohol use   . Ankle fracture, lateral malleolus, closed 12/30/2011  . Anxiety   . Breast mass, left 12/15/2011  . Chronic anemia   . Chronic combined systolic and diastolic CHF (congestive heart failure) ([place])   . CKD (chronic kidney disease), stage III ([place])    pt denies  . Cocaine abuse ([place])   . COPD (chronic obstructive pulmonary disease) ([place])   . Diabetes mellitus, type 2 ([place])   . Diabetic Charcot foot ([place]) 12/30/2011  . Essential hypertension   . History of GI bleed 12/05/2009   Qualifier: Diagnosis of  By: Zeb Comfort    . Hyperlipidemia   . Hypertensive cardiomyopathy ([place]) 11/08/2012   a. 05/2016: echo showing EF of 20-25% with normal cors by cath in 07/2016.  Marland Kitchen Noncompliance with medication regimen   . Obesity   . Panic attacks   . PAT (paroxysmal atrial tachycardia) ([place])    a. started on amiodarone 07/2017 for this.  . Sleep apnea    does not use cpap  . Stroke ([place])    06/24/17  . Tobacco abuse     Past Surgical History: Past Surgical History:  Procedure Laterality Date  . BREAST BIOPSY    . CARDIAC CATHETERIZATION N/A 07/28/2016   Procedure: Left Heart Cath and Coronary Angiography;  Surgeon: Jettie Booze, MD;  Location: [facility];  Service: Cardiovascular;  Laterality: N/A;  . DILATION AND CURETTAGE OF UTERUS    . I&D EXTREMITY Bilateral 09/22/2017   Procedure: BILATERAL DEBRIDEMENT LEG/FOOT ULCERS, APPLY VERAFLO WOUND VAC;  Surgeon:  Newt Minion, MD;  Location: [facility];  Service: Orthopedics;  Laterality: Bilateral;     Allergies:  No Known Allergies   Social History:  reports that she quit smoking about 3 months ago. Her smoking use included cigarettes. She has a 20.00 pack-year smoking history. she has never used smokeless tobacco. She reports that she uses drugs. Drugs: Marijuana and Cocaine. Frequency: 3.00 times per week. She reports that she does not drink  alcohol.   Family History: Family History  Problem Relation Age of Onset  . Hypertension Mother   . Heart attack Mother   . Hypertension Father        CABG   . Hypertension Sister   . Hypertension Brother   . Hypertension Sister   . Cancer Sister        breast   . Arthritis Unknown   . Cancer Unknown   . Diabetes Unknown   . Asthma Unknown   . Hypertension Daughter   . Hypertension Son     Physical Exam: Vitals:   09/28/17 1825 09/28/17 1839 09/28/17 1910 09/28/17 1939  BP: (!) 187/111 (!) 190/95 (!) 194/98 (!) 190/95  Pulse: 69 67 67 66  Resp: 14 14 14 12   Temp:  97.8 F (36.6 C)    TempSrc:      SpO2: 98% 95% 99% 96%  Weight:      Height:        Constitutional: Alert and awake, oriented x3, not in any acute distress. Eyes: PERLA, EOMI, irises appear normal, anicteric sclera,  ENMT: external ears and nose appear normal             Lips appears normal, oropharynx mucosa, tongue, posterior pharynx appear normal  Neck: neck appears normal, no masses, normal ROM, no thyromegaly, no JVD  CVS: S1-S2 clear, no murmur rubs or gallops, no LE edema, normal pedal pulses  Respiratory:  clear to auscultation bilaterally, no wheezing, rales or rhonchi. Respiratory effort normal. No accessory muscle use.  Abdomen: soft nontender, nondistended, normal bowel sounds, no hepatosplenomegaly, no hernias  Musculoskeletal: : no cyanosis, clubbing or edema noted bilaterally Neuro: Cranial nerves II-XII intact, strength, sensation, reflexes Psych: judgement and insight appear normal, stable mood and affect, mental status Skin: Bilateral LE's with surgical dressings and wound vacs. Otherwise, warm, dry, and well-perfused.    Data reviewed:  I have personally reviewed following labs and imaging studies Labs:  CBC: No results for input(s): WBC, NEUTROABS, HGB, HCT, MCV, PLT in the last 168 hours.  Basic Metabolic Panel: Recent Labs  Lab 09/23/17 0554 09/25/17 0500 09/27/17 0532  NA  135 139 140  K 5.0 4.7 4.4  CL 106 109 109  CO2 21* 23 24  GLUCOSE 195* 84 134*  BUN 46* 42* 27*  CREATININE 2.55* 2.24* 1.58*  CALCIUM 8.7* 8.6* 8.7*   GFR Estimated Creatinine Clearance: 34 mL/min (A) (by C-G formula based on SCr of 1.58 mg/dL (H)). Liver Function Tests: No results for input(s): AST, ALT, ALKPHOS, BILITOT, PROT, ALBUMIN in the last 168 hours. No results for input(s): LIPASE, AMYLASE in the last 168 hours. No results for input(s): AMMONIA in the last 168 hours. Coagulation profile No results for input(s): INR, PROTIME in the last 168 hours.  Cardiac Enzymes: No results for input(s): CKTOTAL, CKMB, CKMBINDEX, TROPONINI in the last 168 hours. BNP: Invalid input(s): POCBNP CBG: Recent Labs  Lab 09/28/17 0210 09/28/17 0754 09/28/17 1145 09/28/17 1531 09/28/17 1847  GLUCAP 196* 202* 147* 141* 139*  D-Dimer No results for input(s): DDIMER in the last 72 hours. Hgb A1c No results for input(s): HGBA1C in the last 72 hours. Lipid Profile No results for input(s): CHOL, HDL, LDLCALC, TRIG, CHOLHDL, LDLDIRECT in the last 72 hours. Thyroid function studies No results for input(s): TSH, T4TOTAL, T3FREE, THYROIDAB in the last 72 hours.  Invalid input(s): FREET3 Anemia work up No results for input(s): VITAMINB12, FOLATE, FERRITIN, TIBC, IRON, RETICCTPCT in the last 72 hours. Urinalysis    Component Value Date/Time   COLORURINE YELLOW 08/18/2017 0210   APPEARANCEUR CLOUDY (A) 08/18/2017 0210   LABSPEC 1.012 08/18/2017 0210   PHURINE 6.0 08/18/2017 0210   GLUCOSEU NEGATIVE 08/18/2017 0210   HGBUR SMALL (A) 08/18/2017 0210   BILIRUBINUR NEGATIVE 08/18/2017 0210   KETONESUR NEGATIVE 08/18/2017 0210   PROTEINUR 100 (A) 08/18/2017 0210   UROBILINOGEN 0.2 09/11/2014 1319   NITRITE NEGATIVE 08/18/2017 0210   LEUKOCYTESUR LARGE (A) 08/18/2017 0210     Microbiology Recent Results (from the past 240 hour(s))  Anaerobic culture     Status: None (Preliminary  result)   Collection Time: 09/22/17  1:39 PM  Result Value Ref Range Status   Specimen Description TISSUE RIGHT FOOT ULCER  Final   Special Requests NONE  Final   Culture HOLDING FOR POSSIBLE ANAEROBE  Final   Report Status PENDING  Incomplete  Aerobic Culture (superficial specimen)     Status: None   Collection Time: 09/22/17  1:39 PM  Result Value Ref Range Status   Specimen Description TISSUE RIGHT FOOT ULCER  Final   Special Requests NONE  Final   Gram Stain   Final    ABUNDANT WBC PRESENT, PREDOMINANTLY PMN MODERATE GRAM POSITIVE COCCI IN CLUSTERS MODERATE GRAM NEGATIVE RODS    Culture   Final    MODERATE CITROBACTER KOSERI FEW VANCOMYCIN RESISTANT ENTEROCOCCUS    Report Status 09/27/2017 FINAL  Final   Organism ID, Bacteria CITROBACTER KOSERI  Final   Organism ID, Bacteria VANCOMYCIN RESISTANT ENTEROCOCCUS  Final      Susceptibility   Citrobacter koseri - MIC*    CEFAZOLIN <=4 SENSITIVE Sensitive     CEFEPIME <=1 SENSITIVE Sensitive     CEFTAZIDIME <=1 SENSITIVE Sensitive     CEFTRIAXONE <=1 SENSITIVE Sensitive     CIPROFLOXACIN <=0.25 SENSITIVE Sensitive     GENTAMICIN <=1 SENSITIVE Sensitive     IMIPENEM <=0.25 SENSITIVE Sensitive     TRIMETH/SULFA <=20 SENSITIVE Sensitive     PIP/TAZO 16 SENSITIVE Sensitive     * MODERATE CITROBACTER KOSERI   Vancomycin resistant enterococcus - MIC*    AMPICILLIN <=2 SENSITIVE Sensitive     VANCOMYCIN >=32 RESISTANT Resistant     GENTAMICIN SYNERGY SENSITIVE Sensitive     LINEZOLID 2 SENSITIVE Sensitive     * FEW VANCOMYCIN RESISTANT ENTEROCOCCUS       Inpatient Medications:   Scheduled Meds: . amiodarone  200 mg Oral BID  . carvedilol  25 mg Oral BID WC  . clopidogrel  75 mg Oral Q breakfast  . docusate sodium  100 mg Oral BID  . ezetimibe  10 mg Oral Daily  . feeding supplement (ENSURE ENLIVE)  237 mL Oral BID BM & HS  . FLUoxetine  20 mg Oral Daily  . gabapentin  100 mg Oral TID  . hydrALAZINE  25 mg Oral TID  .  insulin aspart  0-15 Units Subcutaneous TID WC  . insulin aspart  4 Units Subcutaneous TID WC  . montelukast  10  mg Oral QHS  . pantoprazole  40 mg Oral Daily  . potassium chloride SA  20 mEq Oral Daily  . rosuvastatin  40 mg Oral q1800  . tiotropium  18 mcg Inhalation Daily   Continuous Infusions: . sodium chloride 10 mL/hr at 09/25/17 0600  . sodium chloride 10 mL/hr at 09/28/17 1959  . ampicillin (OMNIPEN) IV 1 g (09/28/17 1205)  . cefTRIAXone (ROCEPHIN)  IV 1 g (09/28/17 1408)  . lactated ringers 10 mL/hr at 09/28/17 1600  . methocarbamol (ROBAXIN)  IV       Radiological Exams on Admission: No results found.  Impression/Recommendations  1. Hypertension; hypertensive urgency  - Pt went to OR for skin grafts to bilateral LE's this evening, returned to floor with BP 215/120 after reportedly receiving multiple IVP's labetalol and metoprolol in PACU   - [facility] was consulted for eval and mgmt  - An astute RN on floor removed pediatric BP cuff, placed appropriate-sized adult cuff, and documented BP of 156/93  - Patient complains of pain in bilateral feet, but denies HA, CP, or SOB  - Recommend continuing her home Coreg and hydralazine, continue pain-control, and use hydralazine IVP's prn   2. Diabetic foot wounds  - Status-post debridements bilaterally on 11/28 and skin grafts 12/4  - ID consulting; continue Rocephin and ampicillin; glycemic-control as below    3. Insulin-dependent DM  - A1c >15.5% in August 2018  - Had hypoglycemic event (CBG 28) yesterday evening of 12/3 - Will reduce insulin by ~25%, continue CBG's   4. COPD  - Stable, no wheeze, cough, or dyspnea  - Continue Spiriva and prn nebs    5. Depression, anxiety  - Stable, continue trazodone and Prozac    6. CKD stage III  - SCr 1.58 yesterday, consistent with apparent baseline  - Renally-dose medications, avoid nephrotoxins where possible   7. Tachyarrhythmia  - Stable, maintained on amiodarone, continued     Thank you for this consultation. Will not plan to follow, but please do not hesitate to contact [facility] as needed.   Time Spent: 60 minutes.  Ilene Qua Bayard More M.D. [facility] 09/28/2017, 9:16 PM

## 2017-09-28 NOTE — Progress Notes (Deleted)
Cardiology Office Note   Date:  09/28/2017   ID:  [name], DOB 13-Feb-1966, MRN [id number]  PCP:  [name], MD  Cardiologist:  *** No chief complaint on file.    History of Present Illness: [name] is a 51 y.o. female who presents for ***    Past Medical History:  Diagnosis Date  . Alcohol use   . Ankle fracture, lateral malleolus, closed 12/30/2011  . Anxiety   . Breast mass, left 12/15/2011  . Chronic anemia   . Chronic combined systolic and diastolic CHF (congestive heart failure) ([place])   . CKD (chronic kidney disease), stage III ([place])    pt denies  . Cocaine abuse ([place])   . COPD (chronic obstructive pulmonary disease) ([place])   . Diabetes mellitus, type 2 ([place])   . Diabetic Charcot foot ([place]) 12/30/2011  . Essential hypertension   . History of GI bleed 12/05/2009   Qualifier: Diagnosis of  By: Zeb Comfort    . Hyperlipidemia   . Hypertensive cardiomyopathy ([place]) 11/08/2012   a. 05/2016: echo showing EF of 20-25% with normal cors by cath in 07/2016.  Marland Kitchen Noncompliance with medication regimen   . Obesity   . Panic attacks   . PAT (paroxysmal atrial tachycardia) ([place])    a. started on amiodarone 07/2017 for this.  . Sleep apnea    does not use cpap  . Stroke ([place])    06/24/17  . Tobacco abuse     Past Surgical History:  Procedure Laterality Date  . BREAST BIOPSY    . CARDIAC CATHETERIZATION N/A 07/28/2016   Procedure: Left Heart Cath and Coronary Angiography;  Surgeon: Jettie Booze, MD;  Location: [facility];  Service: Cardiovascular;  Laterality: N/A;  . DILATION AND CURETTAGE OF UTERUS    . I&D EXTREMITY Bilateral 09/22/2017   Procedure: BILATERAL DEBRIDEMENT LEG/FOOT ULCERS, APPLY VERAFLO WOUND VAC;  Surgeon: Newt Minion, MD;  Location: [facility];  Service: Orthopedics;  Laterality: Bilateral;     No current facility-administered medications for this visit.    No current outpatient medications on file.    Facility-Administered Medications Ordered in Other Visits  Medication Dose Route Frequency Provider Last Rate Last Dose  . 0.9 %  sodium chloride infusion   Intravenous Continuous Newt Minion, MD 10 mL/hr at 09/25/17 0600    . [MAR Hold] acetaminophen (TYLENOL) tablet 650 mg  650 mg Oral Q4H PRN Newt Minion, MD   650 mg at 09/27/17 4098   Or  . [MAR Hold] acetaminophen (TYLENOL) suppository 650 mg  650 mg Rectal Q4H PRN Newt Minion, MD      . Doug Sou Hold] amiodarone (PACERONE) tablet 200 mg  200 mg Oral BID Newt Minion, MD   200 mg at 09/27/17 2152  . [MAR Hold] ampicillin (OMNIPEN) 1 g in sodium chloride 0.9 % 50 mL IVPB  1 g Intravenous Q6H Dixon, Melton Krebs, NP 150 mL/hr at 09/28/17 1205 1 g at 09/28/17 1205  . [MAR Hold] bisacodyl (DULCOLAX) suppository 10 mg  10 mg Rectal Daily PRN Newt Minion, MD      . Doug Sou Hold] carvedilol (COREG) tablet 25 mg  25 mg Oral BID WC Newt Minion, MD   25 mg at 09/28/17 0910  . [START ON 09/29/2017] ceFAZolin (ANCEF) IVPB 2g/100 mL premix  2 g Intravenous On Call to [place], NP      . Doug Sou Hold] cefTRIAXone (  ROCEPHIN) 1 g in dextrose 5 % 50 mL IVPB  1 g Intravenous Q24H Newt Minion, MD 100 mL/hr at 09/28/17 1408 1 g at 09/28/17 1408  . chlorhexidine (HIBICLENS) 4 % liquid 4 application  60 mL Topical Once Suzan Slick, NP      . Doug Sou Hold] clopidogrel (PLAVIX) tablet 75 mg  75 mg Oral Q breakfast Newt Minion, MD   75 mg at 09/27/17 0904  . [MAR Hold] docusate sodium (COLACE) capsule 100 mg  100 mg Oral BID Newt Minion, MD   100 mg at 09/27/17 9326  . [MAR Hold] ezetimibe (ZETIA) tablet 10 mg  10 mg Oral Daily Newt Minion, MD   10 mg at 09/27/17 0904  . [MAR Hold] feeding supplement (ENSURE ENLIVE) (ENSURE ENLIVE) liquid 237 mL  237 mL Oral BID BM & HS Newt Minion, MD      . Doug Sou Hold] FLUoxetine (PROZAC) capsule 20 mg  20 mg Oral Daily Newt Minion, MD   20 mg at 09/27/17 0904  . [MAR Hold] gabapentin (NEURONTIN)  capsule 100 mg  100 mg Oral TID Newt Minion, MD   100 mg at 09/27/17 2152  . [MAR Hold] hydrALAZINE (APRESOLINE) tablet 25 mg  25 mg Oral TID Newt Minion, MD   25 mg at 09/27/17 2200  . [MAR Hold] HYDROmorphone (DILAUDID) injection 1 mg  1 mg Intravenous Q2H PRN Newt Minion, MD   1 mg at 09/27/17 1613  . [MAR Hold] insulin aspart (novoLOG) injection 0-15 Units  0-15 Units Subcutaneous TID WC Newt Minion, MD   3 Units at 09/27/17 1730  . [MAR Hold] insulin aspart (novoLOG) injection 4 Units  4 Units Subcutaneous TID WC Newt Minion, MD   4 Units at 09/27/17 1735  . [MAR Hold] ipratropium-albuterol (DUONEB) 0.5-2.5 (3) MG/3ML nebulizer solution 3 mL  3 mL Nebulization Q6H PRN Newt Minion, MD      . lactated ringers infusion   Intravenous Continuous Josephine Igo, MD 10 mL/hr at 09/28/17 1600    . [MAR Hold] magnesium citrate solution 1 Bottle  1 Bottle Oral Once PRN Newt Minion, MD      . Doug Sou Hold] meclizine (ANTIVERT) tablet 25 mg  25 mg Oral TID PRN Newt Minion, MD      . Doug Sou Hold] methocarbamol (ROBAXIN) tablet 500 mg  500 mg Oral Q6H PRN Newt Minion, MD   500 mg at 09/27/17 2153   Or  . [MAR Hold] methocarbamol (ROBAXIN) 500 mg in dextrose 5 % 50 mL IVPB  500 mg Intravenous Q6H PRN Newt Minion, MD      . Doug Sou Hold] metoCLOPramide (REGLAN) tablet 5-10 mg  5-10 mg Oral Q8H PRN Newt Minion, MD       Or  . Doug Sou Hold] metoCLOPramide (REGLAN) injection 5-10 mg  5-10 mg Intravenous Q8H PRN Newt Minion, MD      . Doug Sou Hold] montelukast (SINGULAIR) tablet 10 mg  10 mg Oral QHS Newt Minion, MD   10 mg at 09/27/17 2153  . [MAR Hold] ondansetron (ZOFRAN) tablet 4 mg  4 mg Oral Q6H PRN Newt Minion, MD       Or  . Doug Sou Hold] ondansetron [facility]) injection 4 mg  4 mg Intravenous Q6H PRN Newt Minion, MD      . Doug Sou Hold] oxyCODONE (Oxy IR/ROXICODONE) immediate release tablet 10 mg  10  mg Oral Q3H PRN Newt Minion, MD   10 mg at 09/27/17 2154  . [MAR Hold]  oxyCODONE (Oxy IR/ROXICODONE) immediate release tablet 5 mg  5 mg Oral Q3H PRN Newt Minion, MD   5 mg at 09/26/17 1548  . [MAR Hold] pantoprazole (PROTONIX) EC tablet 40 mg  40 mg Oral Daily Newt Minion, MD   40 mg at 09/27/17 0904  . [MAR Hold] polyethylene glycol (MIRALAX / GLYCOLAX) packet 17 g  17 g Oral Daily PRN Newt Minion, MD   17 g at 09/26/17 1259  . [MAR Hold] potassium chloride SA (K-DUR,KLOR-CON) CR tablet 20 mEq  20 mEq Oral Daily Newt Minion, MD   20 mEq at 09/27/17 0904  . [MAR Hold] rosuvastatin (CRESTOR) tablet 40 mg  40 mg Oral q1800 Newt Minion, MD   40 mg at 09/27/17 2001  . [MAR Hold] tiotropium (SPIRIVA) inhalation capsule 18 mcg  18 mcg Inhalation Daily Newt Minion, MD   18 mcg at 09/27/17 0757  . [MAR Hold] traZODone (DESYREL) tablet 50 mg  50 mg Oral QHS PRN Newt Minion, MD   50 mg at 09/27/17 2200    Allergies:   Patient has no known allergies.    Social History:  The patient  reports that she quit smoking about 3 months ago. Her smoking use included cigarettes. She has a 20.00 pack-year smoking history. she has never used smokeless tobacco. She reports that she uses drugs. Drugs: Marijuana and Cocaine. Frequency: 3.00 times per week. She reports that she does not drink alcohol.   Family History:  The patient's family history includes Arthritis in her unknown relative; Asthma in her unknown relative; Cancer in her sister and unknown relative; Diabetes in her unknown relative; Heart attack in her mother; Hypertension in her brother, daughter, father, mother, sister, sister, and son.    ROS: All other systems are reviewed and negative. Unless otherwise mentioned in H&P    PHYSICAL EXAM: VS:  LMP 05/19/2016  , BMI There is no height or weight on file to calculate BMI. GEN: Well nourished, well developed, in no acute distress HEENT: normal Neck: no JVD, carotid bruits, or masses Cardiac: ***RRR; no murmurs, rubs, or gallops,no edema  Respiratory:   clear to auscultation bilaterally, normal work of breathing GI: soft, nontender, nondistended, + BS MS: no deformity or atrophy Skin: warm and dry, no rash Neuro:  Strength and sensation are intact Psych: euthymic mood, full affect   EKG:  EKG {ACTION; IS/IS [id number]} ordered today. The ekg ordered today demonstrates ***   Recent Labs: 06/22/2017: B Natriuretic Peptide 835.0 08/02/2017: Magnesium 1.7 09/14/2017: ALT 17; Hemoglobin 10.8; Platelets 398 09/27/2017: BUN 27; Creatinine, Ser 1.58; Potassium 4.4; Sodium 140    Lipid Panel    Component Value Date/Time   CHOL 164 06/24/2017 2147   TRIG 87 06/24/2017 2147   HDL 51 06/24/2017 2147   CHOLHDL 3.2 06/24/2017 2147   VLDL 17 06/24/2017 2147   LDLCALC 96 06/24/2017 2147   LDLDIRECT 122 (H) 01/08/2010 0818      Wt Readings from Last 3 Encounters:  09/22/17 130 lb (59 kg)  09/14/17 130 lb (59 kg)  09/13/17 138 lb (62.6 kg)      Other studies Reviewed: Additional studies/ records that were reviewed today include: ***. Review of the above records demonstrates: ***   ASSESSMENT AND PLAN:  1.  ***   Current medicines are reviewed at length with the  patient today.    Labs/ tests ordered today include: *** Phill Myron. [place] Pugh, ANP, AACC   09/28/2017 4:42 PM    [facility] [address], [place], [place] [address] Phone: [phone]; Fax: [phone]

## 2017-09-29 ENCOUNTER — Encounter (HOSPITAL_COMMUNITY): Payer: Self-pay | Admitting: Orthopedic Surgery

## 2017-09-29 LAB — GLUCOSE, CAPILLARY
Glucose-Capillary: 240 mg/dL — ABNORMAL HIGH (ref 65–99)
Glucose-Capillary: 200 mg/dL — ABNORMAL HIGH (ref 65–99)
Glucose-Capillary: 113 mg/dL — ABNORMAL HIGH (ref 65–99)
Glucose-Capillary: 136 mg/dL — ABNORMAL HIGH (ref 65–99)

## 2017-09-29 MED ORDER — HYDRALAZINE HCL 50 MG PO TABS
50.0000 mg | ORAL_TABLET | Freq: Three times a day (TID) | ORAL | Status: DC
  Administered 2017-09-29 – 2017-10-01 (×7): 50 mg via ORAL
  Filled 2017-09-29 (×7): qty 1

## 2017-09-29 NOTE — Progress Notes (Signed)
Patient ID: [name], female   DOB: 1966/06/15, 51 y.o.   MRN: [id number] Postoperative day 1 split-thickness skin graft bilateral lower extremities wound VAC is functioning well.  The wound beds had good granulation tissue no necrotic tissue no signs of infection.  Patient will need discharge to skilled nursing facility she is ready for discharge at this time she will need the wound VAC portable for a week.  Patient will need 4 weeks of oral antibiotics as per infectious disease.  Weightbearing as tolerated bilateral lower extremities.

## 2017-09-29 NOTE — Progress Notes (Signed)
PT Cancellation Note  Patient Details Name: [name] MRN: [id number] DOB: 01/26/1966   Cancelled Treatment:    Reason Eval/Treat Not Completed: Patient declined, no reason specified;Pain limiting ability to participate. Max encouragement provided for participation with continued refusal. Will continue to follow.   Thelma Comp 09/29/2017, 9:40 AM  Rolinda Roan, PT, DPT Acute Rehabilitation Services Pager: [phone]

## 2017-09-29 NOTE — Progress Notes (Signed)
Patient refuses PT. Educated patient about the benefits of PT and eating protein for healing. Pt verbalized understanding. Foley catheter removed. Patient tolerated well.
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
## 2017-09-29 NOTE — Progress Notes (Signed)
INFECTIOUS DISEASE PROGRESS NOTE  ID: [name] is a 51 y.o. female with  Principal Problem:   Hypertensive urgency Active Problems:   Alcohol abuse   HTN (hypertension), malignant   Diabetes mellitus with foot ulcer and gangrene (HCC)   CKD (chronic kidney disease) stage 3, GFR 30-59 ml/min (HCC)   Chronic combined systolic and diastolic CHF (congestive heart failure) (HCC)   Anxiety and depression   DM (diabetes mellitus), type 2, uncontrolled, with renal complications (HCC)   Cocaine abuse (HCC)   COPD (chronic obstructive pulmonary disease) (HCC)   Wound, open, knee, lower leg, or ankle with complication, right, initial encounter  Subjective: Continued pain in feet.   Abtx:  Anti-infectives (From admission, onward)   Start     Dose/Rate Route Frequency Ordered Stop   09/29/17 0900  levofloxacin (LEVAQUIN) tablet 750 mg  Status:  Discontinued     750 mg Oral Every 48 hours 09/27/17 0838 09/27/17 1258   09/29/17 0600  ceFAZolin (ANCEF) IVPB 2g/100 mL premix     2 g 200 mL/hr over 30 Minutes Intravenous On call to O.R. 09/28/17 1543 09/29/17 0515   09/27/17 1800  ampicillin (OMNIPEN) 1 g in sodium chloride 0.9 % 50 mL IVPB     1 g 150 mL/hr over 20 Minutes Intravenous Every 6 hours 09/27/17 1603     09/27/17 1300  cefTRIAXone (ROCEPHIN) 1 g in dextrose 5 % 50 mL IVPB     1 g 100 mL/hr over 30 Minutes Intravenous Every 24 hours 09/27/17 1258     09/27/17 0845  levofloxacin (LEVAQUIN) IVPB 750 mg  Status:  Discontinued     750 mg 100 mL/hr over 90 Minutes Intravenous  Once 09/27/17 0838 09/27/17 1258   09/27/17 0845  metroNIDAZOLE (FLAGYL) tablet 500 mg  Status:  Discontinued     500 mg Oral Every 8 hours 09/27/17 0844 09/27/17 1603   09/24/17 1900  vancomycin (VANCOCIN) IVPB 1000 mg/200 mL premix  Status:  Discontinued     1,000 mg 200 mL/hr over 60 Minutes Intravenous Every 48 hours 09/23/17 0924 09/27/17 0835   09/23/17 1800  vancomycin (VANCOCIN) IVPB 750  mg/150 ml premix  Status:  Discontinued     750 mg 150 mL/hr over 60 Minutes Intravenous Every 24 hours 09/22/17 1717 09/23/17 0924   09/23/17 0000  piperacillin-tazobactam (ZOSYN) IVPB 3.375 g  Status:  Discontinued     3.375 g 12.5 mL/hr over 240 Minutes Intravenous Every 8 hours 09/22/17 1717 09/27/17 0835   09/22/17 1800  piperacillin-tazobactam (ZOSYN) IVPB 3.375 g     3.375 g 100 mL/hr over 30 Minutes Intravenous  Once 09/22/17 1717 09/22/17 2145   09/22/17 1800  vancomycin (VANCOCIN) IVPB 1000 mg/200 mL premix     1,000 mg 200 mL/hr over 60 Minutes Intravenous  Once 09/22/17 1717 09/22/17 2145   09/22/17 1030  ceFAZolin (ANCEF) IVPB 2g/100 mL premix     2 g 200 mL/hr over 30 Minutes Intravenous On call to O.R. 09/22/17 1020 09/22/17 1314      Medications:  Scheduled: . amiodarone  200 mg Oral BID  . carvedilol  25 mg Oral BID WC  . clopidogrel  75 mg Oral Q breakfast  . docusate sodium  100 mg Oral BID  . ezetimibe  10 mg Oral Daily  . feeding supplement (ENSURE ENLIVE)  237 mL Oral BID BM & HS  . FLUoxetine  20 mg Oral Daily  . gabapentin  100 mg Oral  TID  . hydrALAZINE  50 mg Oral TID  . insulin aspart  0-9 Units Subcutaneous TID WC  . insulin aspart  3 Units Subcutaneous TID WC  . montelukast  10 mg Oral QHS  . pantoprazole  40 mg Oral Daily  . potassium chloride SA  20 mEq Oral Daily  . rosuvastatin  40 mg Oral q1800  . tiotropium  18 mcg Inhalation Daily    Objective: Vital signs in last 24 hours: Temp:  [97.7 F (36.5 C)-97.8 F (36.6 C)] 97.8 F (36.6 C) (12/04 1839) Pulse Rate:  [66-78] 78 (12/05 0411) Resp:  [12-17] 17 (12/05 0411) BP: (156-194)/(88-124) 174/97 (12/05 0759) SpO2:  [95 %-99 %] 99 % (12/05 0411)   General appearance: alert, cooperative and no distress Resp: clear to auscultation bilaterally Cardio: regular rate and rhythm GI: normal findings: bowel sounds normal and soft, non-tender Extremities: feet dressed, vac on.   Lab  Results Recent Labs    09/27/17 0532  NA 140  K 4.4  CL 109  CO2 24  BUN 27*  CREATININE 1.58*   Liver Panel No results for input(s): PROT, ALBUMIN, AST, ALT, ALKPHOS, BILITOT, BILIDIR, IBILI in the last 72 hours. Sedimentation Rate No results for input(s): ESRSEDRATE in the last 72 hours. C-Reactive Protein No results for input(s): CRP in the last 72 hours.  Microbiology: Recent Results (from the past 240 hour(s))  Anaerobic culture     Status: None (Preliminary result)   Collection Time: 09/22/17  1:39 PM  Result Value Ref Range Status   Specimen Description TISSUE RIGHT FOOT ULCER  Final   Special Requests NONE  Final   Culture HOLDING FOR POSSIBLE ANAEROBE  Final   Report Status PENDING  Incomplete  Aerobic Culture (superficial specimen)     Status: None   Collection Time: 09/22/17  1:39 PM  Result Value Ref Range Status   Specimen Description TISSUE RIGHT FOOT ULCER  Final   Special Requests NONE  Final   Gram Stain   Final    ABUNDANT WBC PRESENT, PREDOMINANTLY PMN MODERATE GRAM POSITIVE COCCI IN CLUSTERS MODERATE GRAM NEGATIVE RODS    Culture   Final    MODERATE CITROBACTER KOSERI FEW VANCOMYCIN RESISTANT ENTEROCOCCUS    Report Status 09/27/2017 FINAL  Final   Organism ID, Bacteria CITROBACTER KOSERI  Final   Organism ID, Bacteria VANCOMYCIN RESISTANT ENTEROCOCCUS  Final      Susceptibility   Citrobacter koseri - MIC*    CEFAZOLIN <=4 SENSITIVE Sensitive     CEFEPIME <=1 SENSITIVE Sensitive     CEFTAZIDIME <=1 SENSITIVE Sensitive     CEFTRIAXONE <=1 SENSITIVE Sensitive     CIPROFLOXACIN <=0.25 SENSITIVE Sensitive     GENTAMICIN <=1 SENSITIVE Sensitive     IMIPENEM <=0.25 SENSITIVE Sensitive     TRIMETH/SULFA <=20 SENSITIVE Sensitive     PIP/TAZO 16 SENSITIVE Sensitive     * MODERATE CITROBACTER KOSERI   Vancomycin resistant enterococcus - MIC*    AMPICILLIN <=2 SENSITIVE Sensitive     VANCOMYCIN >=32 RESISTANT Resistant     GENTAMICIN SYNERGY  SENSITIVE Sensitive     LINEZOLID 2 SENSITIVE Sensitive     * FEW VANCOMYCIN RESISTANT ENTEROCOCCUS    Studies/Results: No results found.   Assessment/Plan: Diabetic foot ulcers (VRE, citrobacter)             Debridement, Skin grafting (12-4) CKD DM2  Total days of antibiotics: 7  Ceftriaxone/ampicillin  Dr Sharol Given note noted, to SNF She can be  d/c on amoxil/cipro for 4 weeks total of anbx (she needs 21 more days at this point). Improved DM control will help with wound healing.   Would be glad to see in clinic as needed.  Available as needed.          Bobby Rumpf MD, FACP Infectious Diseases (pager) [phone] [email or web address] 09/29/2017, 9:31 AM  LOS: 7 days

## 2017-09-30 ENCOUNTER — Encounter: Payer: Self-pay | Admitting: Family Medicine

## 2017-09-30 ENCOUNTER — Ambulatory Visit: Payer: Medicare Other | Admitting: Adult Health

## 2017-09-30 LAB — GLUCOSE, CAPILLARY
Glucose-Capillary: 166 mg/dL — ABNORMAL HIGH (ref 65–99)
Glucose-Capillary: 118 mg/dL — ABNORMAL HIGH (ref 65–99)
Glucose-Capillary: 190 mg/dL — ABNORMAL HIGH (ref 65–99)
Glucose-Capillary: 237 mg/dL — ABNORMAL HIGH (ref 65–99)

## 2017-09-30 LAB — ANAEROBIC CULTURE

## 2017-09-30 MED ORDER — OXYCODONE-ACETAMINOPHEN 5-325 MG PO TABS: 1.0000 | ORAL_TABLET | Freq: Three times a day (TID) | ORAL | 0 refills | Status: DC | PRN

## 2017-09-30 NOTE — Progress Notes (Signed)
Patient ID: [name], female   DOB: 19-Aug-1966, 50 y.o.   MRN: [id number] Patient is status post split-thickness skin graft to both lower extremities with wound vacs.  The wound vacs are functioning well.  Patient is ready for discharge to skilled nursing, weightbearing as tolerated both lower extremities patient will need to be discharged with 2 of the portable Praveena pumps for both legs these will stay in place for 1 week postoperatively and then dressing changes.

## 2017-09-30 NOTE — Discharge Summary (Signed)
Discharge Diagnoses:  Principal Problem:   Hypertensive urgency Active Problems:   Alcohol abuse   HTN (hypertension), malignant   Diabetes mellitus with foot ulcer and gangrene (HCC)   CKD (chronic kidney disease) stage 3, GFR 30-59 ml/min (HCC)   Chronic combined systolic and diastolic CHF (congestive heart failure) (HCC)   Anxiety and depression   DM (diabetes mellitus), type 2, uncontrolled, with renal complications (HCC)   Cocaine abuse (HCC)   COPD (chronic obstructive pulmonary disease) ([place])   Wound, open, knee, lower leg, or ankle with complication, right, initial encounter   Surgeries: Procedure(s): BILATERAL SPLIT THICKNESS SKIN GRAFT LEGS/FEET AND APPLY VAC on 09/28/2017    Consultants:   Discharged Condition: Improved  Hospital Course: [name] is an 51 y.o. female who was admitted 09/22/2017 with a chief complaint of ischemic ulcers bilateral lower extremities, with a final diagnosis of Wounds Bilateral Lower Extremities.  Patient was brought to the operating room on 09/28/2017 and underwent Procedure(s): BILATERAL SPLIT THICKNESS SKIN GRAFT LEGS/FEET AND APPLY VAC.    Patient was given perioperative antibiotics:  Anti-infectives (From admission, onward)   Start     Dose/Rate Route Frequency Ordered Stop   09/29/17 0900  levofloxacin (LEVAQUIN) tablet 750 mg  Status:  Discontinued     750 mg Oral Every 48 hours 09/27/17 0838 09/27/17 1258   09/29/17 0600  ceFAZolin (ANCEF) IVPB 2g/100 mL premix     2 g 200 mL/hr over 30 Minutes Intravenous On call to O.R. 09/28/17 1543 09/29/17 0515   09/27/17 1800  ampicillin (OMNIPEN) 1 g in sodium chloride 0.9 % 50 mL IVPB     1 g 150 mL/hr over 20 Minutes Intravenous Every 6 hours 09/27/17 1603     09/27/17 1300  cefTRIAXone (ROCEPHIN) 1 g in dextrose 5 % 50 mL IVPB     1 g 100 mL/hr over 30 Minutes Intravenous Every 24 hours 09/27/17 1258     09/27/17 0845  levofloxacin (LEVAQUIN) IVPB 750 mg  Status:  Discontinued      750 mg 100 mL/hr over 90 Minutes Intravenous  Once 09/27/17 0838 09/27/17 1258   09/27/17 0845  metroNIDAZOLE (FLAGYL) tablet 500 mg  Status:  Discontinued     500 mg Oral Every 8 hours 09/27/17 0844 09/27/17 1603   09/24/17 1900  vancomycin (VANCOCIN) IVPB 1000 mg/200 mL premix  Status:  Discontinued     1,000 mg 200 mL/hr over 60 Minutes Intravenous Every 48 hours 09/23/17 0924 09/27/17 0835   09/23/17 1800  vancomycin (VANCOCIN) IVPB 750 mg/150 ml premix  Status:  Discontinued     750 mg 150 mL/hr over 60 Minutes Intravenous Every 24 hours 09/22/17 1717 09/23/17 0924   09/23/17 0000  piperacillin-tazobactam (ZOSYN) IVPB 3.375 g  Status:  Discontinued     3.375 g 12.5 mL/hr over 240 Minutes Intravenous Every 8 hours 09/22/17 1717 09/27/17 0835   09/22/17 1800  piperacillin-tazobactam (ZOSYN) IVPB 3.375 g     3.375 g 100 mL/hr over 30 Minutes Intravenous  Once 09/22/17 1717 09/22/17 2145   09/22/17 1800  vancomycin (VANCOCIN) IVPB 1000 mg/200 mL premix     1,000 mg 200 mL/hr over 60 Minutes Intravenous  Once 09/22/17 1717 09/22/17 2145   09/22/17 1030  ceFAZolin (ANCEF) IVPB 2g/100 mL premix     2 g 200 mL/hr over 30 Minutes Intravenous On call to O.R. 09/22/17 1020 09/22/17 1314    .  Patient was given sequential compression devices, early ambulation, and  aspirin for DVT prophylaxis.  Recent vital signs:  Patient Vitals for the past 24 hrs:  BP Temp Temp src Pulse Resp SpO2  09/30/17 0803 - - - - - 99 %  09/30/17 0436 (!) 156/86 98.1 F (36.7 C) Oral 61 16 100 %  09/29/17 2221 (!) 137/92 (!) 96.6 F (35.9 C) Oral (!) 58 16 100 %  09/29/17 1815 (!) 161/106 - - 64 18 95 %  09/29/17 1328 (!) 159/96 (!) 96.1 F (35.6 C) Oral 67 18 99 %  09/29/17 1000 - - - - - 98 %  .  Recent laboratory studies: No results found.  Discharge Medications:   Allergies as of 09/30/2017   No Known Allergies     Medication List    STOP taking these medications   collagenase  ointment Commonly known as:  SANTYL   doxycycline 100 MG tablet Commonly known as:  VIBRA-TABS   traMADol 50 MG tablet Commonly known as:  ULTRAM     TAKE these medications   ACCU-CHEK AVIVA PLUS w/Device Kit Four times daily testing dx E11.65   ACCU-CHEK FASTCLIX LANCETS Misc Test up to 4 times daily DX e11.65   amiodarone 200 MG tablet Commonly known as:  PACERONE Take 1 tablet (200 mg total) by mouth 2 (two) times daily.   aspirin 81 MG EC tablet Take 1 tablet (81 mg total) by mouth daily.   ASTEPRO 0.15 % Soln Generic drug:  Azelastine HCl Place 1 spray into the nose every 12 (twelve) hours as needed (for allergies).   carvedilol 25 MG tablet Commonly known as:  COREG Take 1 tablet (25 mg total) by mouth 2 (two) times daily with a meal.   clopidogrel 75 MG tablet Commonly known as:  PLAVIX TAKE 1 TABLET BY MOUTH DAILY WITH BREAKFAST.   ezetimibe 10 MG tablet Commonly known as:  ZETIA Take 1 tablet (10 mg total) by mouth daily.   fenofibrate micronized 200 MG capsule Commonly known as:  LOFIBRA Take 200 mg by mouth daily before breakfast.   FLUoxetine 20 MG capsule Commonly known as:  PROZAC TAKE 1 CAPSULE BY MOUTH EVERY DAY.   gabapentin 100 MG capsule Commonly known as:  NEURONTIN Take 100 mg by mouth 3 (three) times daily.   glucose blood test strip Commonly known as:  ACCU-CHEK AVIVA Use as instructed four times daily dx: E11.65   hydrALAZINE 25 MG tablet Commonly known as:  APRESOLINE Take 25 mg 3 (three) times daily by mouth.   ipratropium-albuterol 0.5-2.5 (3) MG/3ML Soln Commonly known as:  DUONEB Take 3 mLs by nebulization every 6 (six) hours as needed.   LANTUS SOLOSTAR 100 UNIT/ML Solostar Pen Generic drug:  Insulin Glargine Inject 8 Units daily at 10 pm into the skin.   meclizine 25 MG tablet Commonly known as:  ANTIVERT Take 1 tablet (25 mg total) by mouth 3 (three) times daily as needed for dizziness.   montelukast 10 MG  tablet Commonly known as:  SINGULAIR TAKE (1) TABLET BY MOUTH AT BEDTIME.   multivitamin with minerals Tabs tablet Take 1 tablet by mouth daily.   oxyCODONE-acetaminophen 5-325 MG tablet Commonly known as:  PERCOCET/ROXICET Take 1 tablet by mouth every 8 (eight) hours as needed for severe pain.   pantoprazole 40 MG tablet Commonly known as:  PROTONIX Take 1 tablet (40 mg total) by mouth daily.   potassium chloride SA 20 MEQ tablet Commonly known as:  K-DUR,KLOR-CON TAKE 1&1/2 TABLET BY MOUTH DAILY.  PROAIR HFA 108 (90 Base) MCG/ACT inhaler Generic drug:  albuterol INHALE 2 PUFFS EVERY 6 HOURS AS NEEDED FOR SHORTNESS OF BREATH/WHEEZING.   rosuvastatin 40 MG tablet Commonly known as:  CRESTOR Take 1 tablet (40 mg total) by mouth daily.   silver sulfADIAZINE 1 % cream Commonly known as:  SILVADENE Apply 1 application topically daily.   SPIRIVA HANDIHALER 18 MCG inhalation capsule Generic drug:  tiotropium Place 18 mcg into inhaler and inhale daily. 2 puffs   thiamine 100 MG tablet Take 1 tablet (100 mg total) by mouth daily.   TRADJENTA 5 MG Tabs tablet Generic drug:  linagliptin TAKE 1 TABLET BY MOUTH ONCE A DAY.   traZODone 50 MG tablet Commonly known as:  DESYREL TAKE (1) TABLET BY MOUTH AT BEDTIME.   Vitamin D (Ergocalciferol) 50000 units Caps capsule Commonly known as:  DRISDOL Take 1 capsule (50,000 Units total) by mouth once a week.       Diagnostic Studies: No results found.  Patient benefited maximally from their hospital stay and there were no complications.     Disposition: 01-Home or Self Care Discharge Instructions    Call MD / Call 911   Complete by:  As directed    If you experience chest pain or shortness of breath, CALL 911 and be transported to the hospital emergency room.  If you develope a fever above 101 F, pus (white drainage) or increased drainage or redness at the wound, or calf pain, call your surgeon's office.   Constipation  Prevention   Complete by:  As directed    Drink plenty of fluids.  Prune juice may be helpful.  You may use a stool softener, such as Colace (over the counter) 100 mg twice a day.  Use MiraLax (over the counter) for constipation as needed.   Diet - low sodium heart healthy   Complete by:  As directed    Increase activity slowly as tolerated   Complete by:  As directed    Negative Pressure Wound Therapy - Incisional   Complete by:  As directed    Obtained 2 portable Praveena wound VAC from the operating room and removed the hospital wound VAC pumps and attached to the portable Praveena pumps.  This will remain in place for 1 week postoperatively.     Follow-up Information    Newt Minion, MD Follow up in 1 week(s).   Specialty:  Orthopedic Surgery Contact information: [place] [address] [phone]            Signed: Newt Minion 09/30/2017, 8:23 AM

## 2017-09-30 NOTE — Social Work (Addendum)
CSW called [facility] a few times and they have declined to take patient despite patient adamant that they would.  SNF-[facility] rescinded bed offer due to substance use.  Pt d/n want [facility] SNF due to past experience.  CSW f/u with SNF [facility] that made a bed offer previously.  CSW f/u on [facility], [facility], [facility], and [facility] called back and declined patient due to substance use. 4:44pm [facility] declined patient.  CSW f/u on bed offers.  Elissa Hefty, LCSW Clinical Social Worker [phone]

## 2017-09-30 NOTE — Progress Notes (Signed)
PT Cancellation Note  Patient Details Name: [name] MRN: [id number] DOB: 26-Feb-1966   Cancelled Treatment:     Attempted to motivate patient to work with therapy this morning at 0808. Patient adamantly refusing any form of mobilization, citing strict religious views. "my family and I only move on the 6th day after a surgery, its worked for them and will work for me because the [name] is making the decisions". As a result,  patient sternly agrees to move out of bed on this upcoming Monday. Therapies have been  unable to convince patient otherwise despite many attempts. Will cont to follow  Reinaldo Berber, PT, DPT Acute Rehab Services Pager: [phone]    Reinaldo Berber 09/30/2017, 8:14 AM

## 2017-09-30 NOTE — Plan of Care (Signed)
  Adequate for Discharge Health Behavior/Discharge Planning: Ability to manage health-related needs will improve 09/30/2017 0826 - Adequate for Discharge by Rance Muir, RN Clinical Measurements: Ability to maintain clinical measurements within normal limits will improve 09/30/2017 0826 - Adequate for Discharge by Rance Muir, RN Will remain free from infection 09/30/2017 8850 - Adequate for Discharge by Rance Muir, RN Diagnostic test results will improve 09/30/2017 0826 - Adequate for Discharge by Rance Muir, RN Respiratory complications will improve 09/30/2017 0826 - Adequate for Discharge by Rance Muir, RN Cardiovascular complication will be avoided 09/30/2017 0826 - Adequate for Discharge by Rance Muir, RN Activity: Risk for activity intolerance will decrease 09/30/2017 0826 - Adequate for Discharge by Rance Muir, RN Nutrition: Adequate nutrition will be maintained 09/30/2017 0826 - Adequate for Discharge by Rance Muir, RN Coping: Level of anxiety will decrease 09/30/2017 0826 - Adequate for Discharge by Rance Muir, RN Elimination: Will not experience complications related to bowel motility 09/30/2017 0826 - Adequate for Discharge by Rance Muir, RN Will not experience complications related to urinary retention 09/30/2017 0826 - Adequate for Discharge by Rance Muir, RN Pain Managment: General experience of comfort will improve 09/30/2017 0826 - Adequate for Discharge by Rance Muir, RN Skin Integrity: Risk for impaired skin integrity will decrease 09/30/2017 0826 - Adequate for Discharge by Rance Muir, RN Clinical Measurements: Ability to maintain clinical measurements within normal limits will improve 09/30/2017 0826 - Adequate for Discharge by Rance Muir, RN Postoperative complications will be avoided or minimized 09/30/2017 0826 - Adequate for Discharge by Rance Muir, RN Skin Integrity: Demonstration of wound healing without infection will improve 09/30/2017 2774 - Adequate for Discharge by Rance Muir,  RN

## 2017-09-30 NOTE — Social Work (Signed)
CSW contacted [facility] to f/u as the SNF's declined patient. CSW f/u and [facility] declined patient due to substance use.  CSW advised patient of decline and patient indicated that [facility] has taken her in the past and CSW should speak with SW. CSW will f/u. CSW then advised patient that we will need to see if other SNF's can offer bed. CSW let her know that a back SNf is necessary as doctor has put in dc summary for today and we will need to transition patient to SNF.  Pt then indicated that she is not medically ready to leave hospital. CSW explored further and indicated that clinical feels that patient is medically ready to discharge to SNF today.  CSW will advise clinical staff to f/u and reiterate that patient dc today.  Elissa Hefty, LCSW Clinical Social Worker [phone]

## 2017-10-01 DIAGNOSIS — L97321 Non-pressure chronic ulcer of left ankle limited to breakdown of skin: Secondary | ICD-10-CM | POA: Diagnosis not present

## 2017-10-01 DIAGNOSIS — I70263 Atherosclerosis of native arteries of extremities with gangrene, bilateral legs: Secondary | ICD-10-CM | POA: Diagnosis not present

## 2017-10-01 DIAGNOSIS — Z7902 Long term (current) use of antithrombotics/antiplatelets: Secondary | ICD-10-CM | POA: Diagnosis not present

## 2017-10-01 DIAGNOSIS — Z79899 Other long term (current) drug therapy: Secondary | ICD-10-CM | POA: Diagnosis not present

## 2017-10-01 DIAGNOSIS — R278 Other lack of coordination: Secondary | ICD-10-CM | POA: Diagnosis not present

## 2017-10-01 DIAGNOSIS — I509 Heart failure, unspecified: Secondary | ICD-10-CM | POA: Diagnosis not present

## 2017-10-01 DIAGNOSIS — I130 Hypertensive heart and chronic kidney disease with heart failure and stage 1 through stage 4 chronic kidney disease, or unspecified chronic kidney disease: Secondary | ICD-10-CM | POA: Diagnosis not present

## 2017-10-01 DIAGNOSIS — E1140 Type 2 diabetes mellitus with diabetic neuropathy, unspecified: Secondary | ICD-10-CM | POA: Diagnosis not present

## 2017-10-01 DIAGNOSIS — I5042 Chronic combined systolic (congestive) and diastolic (congestive) heart failure: Secondary | ICD-10-CM | POA: Diagnosis not present

## 2017-10-01 DIAGNOSIS — Z7982 Long term (current) use of aspirin: Secondary | ICD-10-CM | POA: Diagnosis not present

## 2017-10-01 DIAGNOSIS — S81801A Unspecified open wound, right lower leg, initial encounter: Secondary | ICD-10-CM | POA: Diagnosis not present

## 2017-10-01 DIAGNOSIS — I10 Essential (primary) hypertension: Secondary | ICD-10-CM | POA: Diagnosis not present

## 2017-10-01 DIAGNOSIS — E11622 Type 2 diabetes mellitus with other skin ulcer: Secondary | ICD-10-CM | POA: Diagnosis not present

## 2017-10-01 DIAGNOSIS — I471 Supraventricular tachycardia: Secondary | ICD-10-CM | POA: Diagnosis not present

## 2017-10-01 DIAGNOSIS — S81839A Puncture wound without foreign body, unspecified lower leg, initial encounter: Secondary | ICD-10-CM | POA: Diagnosis not present

## 2017-10-01 DIAGNOSIS — E1122 Type 2 diabetes mellitus with diabetic chronic kidney disease: Secondary | ICD-10-CM | POA: Diagnosis not present

## 2017-10-01 DIAGNOSIS — R339 Retention of urine, unspecified: Secondary | ICD-10-CM | POA: Diagnosis not present

## 2017-10-01 DIAGNOSIS — L97211 Non-pressure chronic ulcer of right calf limited to breakdown of skin: Secondary | ICD-10-CM | POA: Diagnosis not present

## 2017-10-01 DIAGNOSIS — I160 Hypertensive urgency: Secondary | ICD-10-CM | POA: Diagnosis not present

## 2017-10-01 DIAGNOSIS — R330 Drug induced retention of urine: Secondary | ICD-10-CM | POA: Diagnosis not present

## 2017-10-01 DIAGNOSIS — E1169 Type 2 diabetes mellitus with other specified complication: Secondary | ICD-10-CM | POA: Diagnosis not present

## 2017-10-01 DIAGNOSIS — L03115 Cellulitis of right lower limb: Secondary | ICD-10-CM | POA: Diagnosis not present

## 2017-10-01 DIAGNOSIS — I739 Peripheral vascular disease, unspecified: Secondary | ICD-10-CM | POA: Diagnosis not present

## 2017-10-01 DIAGNOSIS — L97919 Non-pressure chronic ulcer of unspecified part of right lower leg with unspecified severity: Secondary | ICD-10-CM | POA: Diagnosis not present

## 2017-10-01 DIAGNOSIS — D649 Anemia, unspecified: Secondary | ICD-10-CM | POA: Diagnosis not present

## 2017-10-01 DIAGNOSIS — Z4889 Encounter for other specified surgical aftercare: Secondary | ICD-10-CM | POA: Diagnosis not present

## 2017-10-01 DIAGNOSIS — L03119 Cellulitis of unspecified part of limb: Secondary | ICD-10-CM | POA: Diagnosis not present

## 2017-10-01 DIAGNOSIS — L97821 Non-pressure chronic ulcer of other part of left lower leg limited to breakdown of skin: Secondary | ICD-10-CM | POA: Diagnosis not present

## 2017-10-01 DIAGNOSIS — Z87891 Personal history of nicotine dependence: Secondary | ICD-10-CM | POA: Diagnosis not present

## 2017-10-01 DIAGNOSIS — M6281 Muscle weakness (generalized): Secondary | ICD-10-CM | POA: Diagnosis not present

## 2017-10-01 DIAGNOSIS — R2689 Other abnormalities of gait and mobility: Secondary | ICD-10-CM | POA: Diagnosis not present

## 2017-10-01 DIAGNOSIS — S8990XA Unspecified injury of unspecified lower leg, initial encounter: Secondary | ICD-10-CM | POA: Diagnosis not present

## 2017-10-01 DIAGNOSIS — L97221 Non-pressure chronic ulcer of left calf limited to breakdown of skin: Secondary | ICD-10-CM | POA: Diagnosis not present

## 2017-10-01 DIAGNOSIS — E119 Type 2 diabetes mellitus without complications: Secondary | ICD-10-CM | POA: Diagnosis not present

## 2017-10-01 DIAGNOSIS — J449 Chronic obstructive pulmonary disease, unspecified: Secondary | ICD-10-CM | POA: Diagnosis not present

## 2017-10-01 DIAGNOSIS — S81802A Unspecified open wound, left lower leg, initial encounter: Secondary | ICD-10-CM | POA: Diagnosis not present

## 2017-10-01 DIAGNOSIS — L97929 Non-pressure chronic ulcer of unspecified part of left lower leg with unspecified severity: Secondary | ICD-10-CM | POA: Diagnosis not present

## 2017-10-01 DIAGNOSIS — N183 Chronic kidney disease, stage 3 (moderate): Secondary | ICD-10-CM | POA: Diagnosis not present

## 2017-10-01 DIAGNOSIS — L8992 Pressure ulcer of unspecified site, stage 2: Secondary | ICD-10-CM | POA: Diagnosis not present

## 2017-10-01 LAB — GLUCOSE, CAPILLARY
Glucose-Capillary: 187 mg/dL — ABNORMAL HIGH (ref 65–99)
Glucose-Capillary: 166 mg/dL — ABNORMAL HIGH (ref 65–99)

## 2017-10-01 NOTE — Progress Notes (Signed)
Patient ID: [name], female   DOB: Nov 12, 1965, 51 y.o.   MRN: [id number] Patient has had multiple skilled nursing facility bed options denied due to substance abuse.  Will plan for discharge to skilled nursing today.  No change in patient's status from discharge summary yesterday.

## 2017-10-01 NOTE — Progress Notes (Signed)
Report called and given to RN at [facility]

## 2017-10-01 NOTE — Social Work (Signed)
Clinical Social Worker facilitated patient discharge including contacting patient family and facility to confirm patient discharge plans.  Clinical information faxed to facility and family agreeable with plan.    CSW arranged ambulance transport via [facility] to [facility].    RN to call [phone] to give  report prior to discharge.  Clinical Social Worker will sign off for now as social work intervention is no longer needed. Please consult [name] again if new need arises.  Elissa Hefty, LCSW Clinical Social Worker [phone]

## 2017-10-01 NOTE — Progress Notes (Addendum)
Pt's belongings gather together and put in bags to be transported to [facility] via [facility]. [place] portable wound vacs applied bilaterally. Pt is no distress at time of discharge.  Attempted to call report to [facility], left message with RN's number. Will attempt to call again.

## 2017-10-01 NOTE — Clinical Social Work Placement (Signed)
   CLINICAL SOCIAL WORK PLACEMENT  NOTE  Date:  10/01/2017  Patient Details  Name: [name] MRN: [id number] Date of Birth: 1966/09/17  Clinical Social Work is seeking post-discharge placement for this patient at the [place] level of care (*CSW will initial, date and re-position this form in  chart as items are completed):  Yes   Patient/family provided with [facility] Work Department's list of facilities offering this level of care within the geographic area requested by the patient (or if unable, by the patient's family).  Yes   Patient/family informed of their freedom to choose among providers that offer the needed level of care, that participate in Medicare, Medicaid or managed care program needed by the patient, have an available bed and are willing to accept the patient.  Yes   Patient/family informed of [facility]'s ownership interest in [facility] and [facility], as well as of the fact that they are under no obligation to receive care at these facilities.  PASRR submitted to EDS on       PASRR number received on       Existing PASRR number confirmed on       FL2 transmitted to all facilities in geographic area requested by pt/family on       FL2 transmitted to all facilities within larger geographic area on       Patient informed that his/her managed care company has contracts with or will negotiate with certain facilities, including the following:        Yes   Patient/family informed of bed offers received.  Patient chooses bed at [facility]     Physician recommends and patient chooses bed at      Patient to be transferred to [facility] on 10/01/17.  Patient to be transferred to facility by [facility]     Patient family notified on 10/01/17 of transfer.  Name of family member notified:  pt responsbible for self     PHYSICIAN       Additional Comment:     _______________________________________________ Normajean Baxter, LCSW 10/01/2017, 10:42 AM

## 2017-10-01 NOTE — Discharge Summary (Signed)
Discharge Diagnoses:  Principal Problem:   Hypertensive urgency Active Problems:   Alcohol abuse   HTN (hypertension), malignant   Diabetes mellitus with foot ulcer and gangrene (HCC)   CKD (chronic kidney disease) stage 3, GFR 30-59 ml/min (HCC)   Chronic combined systolic and diastolic CHF (congestive heart failure) (HCC)   Anxiety and depression   DM (diabetes mellitus), type 2, uncontrolled, with renal complications (HCC)   Cocaine abuse (HCC)   COPD (chronic obstructive pulmonary disease) ([place])   Wound, open, knee, lower leg, or ankle with complication, right, initial encounter   Surgeries: Procedure(s): BILATERAL SPLIT THICKNESS SKIN GRAFT LEGS/FEET AND APPLY VAC on 09/28/2017    Consultants:   Discharged Condition: Improved  Hospital Course: [name] is an 51 y.o. female who was admitted 09/22/2017 with a chief complaint of ischemic wounds bilateral lower extremities, with a final diagnosis of Wounds Bilateral Lower Extremities.  Patient was brought to the operating room on 09/28/2017 and underwent Procedure(s): BILATERAL SPLIT THICKNESS SKIN GRAFT LEGS/FEET AND APPLY VAC.    Patient was given perioperative antibiotics:  Anti-infectives (From admission, onward)   Start     Dose/Rate Route Frequency Ordered Stop   09/29/17 0900  levofloxacin (LEVAQUIN) tablet 750 mg  Status:  Discontinued     750 mg Oral Every 48 hours 09/27/17 0838 09/27/17 1258   09/29/17 0600  ceFAZolin (ANCEF) IVPB 2g/100 mL premix     2 g 200 mL/hr over 30 Minutes Intravenous On call to O.R. 09/28/17 1543 09/29/17 0515   09/27/17 1800  ampicillin (OMNIPEN) 1 g in sodium chloride 0.9 % 50 mL IVPB     1 g 150 mL/hr over 20 Minutes Intravenous Every 6 hours 09/27/17 1603     09/27/17 1300  cefTRIAXone (ROCEPHIN) 1 g in dextrose 5 % 50 mL IVPB     1 g 100 mL/hr over 30 Minutes Intravenous Every 24 hours 09/27/17 1258     09/27/17 0845  levofloxacin (LEVAQUIN) IVPB 750 mg  Status:  Discontinued      750 mg 100 mL/hr over 90 Minutes Intravenous  Once 09/27/17 0838 09/27/17 1258   09/27/17 0845  metroNIDAZOLE (FLAGYL) tablet 500 mg  Status:  Discontinued     500 mg Oral Every 8 hours 09/27/17 0844 09/27/17 1603   09/24/17 1900  vancomycin (VANCOCIN) IVPB 1000 mg/200 mL premix  Status:  Discontinued     1,000 mg 200 mL/hr over 60 Minutes Intravenous Every 48 hours 09/23/17 0924 09/27/17 0835   09/23/17 1800  vancomycin (VANCOCIN) IVPB 750 mg/150 ml premix  Status:  Discontinued     750 mg 150 mL/hr over 60 Minutes Intravenous Every 24 hours 09/22/17 1717 09/23/17 0924   09/23/17 0000  piperacillin-tazobactam (ZOSYN) IVPB 3.375 g  Status:  Discontinued     3.375 g 12.5 mL/hr over 240 Minutes Intravenous Every 8 hours 09/22/17 1717 09/27/17 0835   09/22/17 1800  piperacillin-tazobactam (ZOSYN) IVPB 3.375 g     3.375 g 100 mL/hr over 30 Minutes Intravenous  Once 09/22/17 1717 09/22/17 2145   09/22/17 1800  vancomycin (VANCOCIN) IVPB 1000 mg/200 mL premix     1,000 mg 200 mL/hr over 60 Minutes Intravenous  Once 09/22/17 1717 09/22/17 2145   09/22/17 1030  ceFAZolin (ANCEF) IVPB 2g/100 mL premix     2 g 200 mL/hr over 30 Minutes Intravenous On call to O.R. 09/22/17 1020 09/22/17 1314    .  Patient was given sequential compression devices, early ambulation, and  aspirin for DVT prophylaxis.  Recent vital signs:  Patient Vitals for the past 24 hrs:  BP Temp Temp src Pulse Resp SpO2  10/01/17 0511 (!) 167/110 99 F (37.2 C) Oral 68 18 -  09/30/17 2308 (!) 157/88 - - - - -  09/30/17 2201 (!) 157/89 (!) 97.5 F (36.4 C) Oral 71 16 100 %  09/30/17 1521 (!) 146/85 98.7 F (37.1 C) Oral 67 17 98 %  09/30/17 0803 - - - - - 99 %  .  Recent laboratory studies: No results found.  Discharge Medications:   Allergies as of 10/01/2017   No Known Allergies     Medication List    STOP taking these medications   collagenase ointment Commonly known as:  SANTYL   doxycycline 100 MG  tablet Commonly known as:  VIBRA-TABS   traMADol 50 MG tablet Commonly known as:  ULTRAM     TAKE these medications   ACCU-CHEK AVIVA PLUS w/Device Kit Four times daily testing dx E11.65   ACCU-CHEK FASTCLIX LANCETS Misc Test up to 4 times daily DX e11.65   amiodarone 200 MG tablet Commonly known as:  PACERONE Take 1 tablet (200 mg total) by mouth 2 (two) times daily.   aspirin 81 MG EC tablet Take 1 tablet (81 mg total) by mouth daily.   ASTEPRO 0.15 % Soln Generic drug:  Azelastine HCl Place 1 spray into the nose every 12 (twelve) hours as needed (for allergies).   carvedilol 25 MG tablet Commonly known as:  COREG Take 1 tablet (25 mg total) by mouth 2 (two) times daily with a meal.   clopidogrel 75 MG tablet Commonly known as:  PLAVIX TAKE 1 TABLET BY MOUTH DAILY WITH BREAKFAST.   ezetimibe 10 MG tablet Commonly known as:  ZETIA Take 1 tablet (10 mg total) by mouth daily.   fenofibrate micronized 200 MG capsule Commonly known as:  LOFIBRA Take 200 mg by mouth daily before breakfast.   FLUoxetine 20 MG capsule Commonly known as:  PROZAC TAKE 1 CAPSULE BY MOUTH EVERY DAY.   gabapentin 100 MG capsule Commonly known as:  NEURONTIN Take 100 mg by mouth 3 (three) times daily.   glucose blood test strip Commonly known as:  ACCU-CHEK AVIVA Use as instructed four times daily dx: E11.65   hydrALAZINE 25 MG tablet Commonly known as:  APRESOLINE Take 25 mg 3 (three) times daily by mouth.   ipratropium-albuterol 0.5-2.5 (3) MG/3ML Soln Commonly known as:  DUONEB Take 3 mLs by nebulization every 6 (six) hours as needed.   LANTUS SOLOSTAR 100 UNIT/ML Solostar Pen Generic drug:  Insulin Glargine Inject 8 Units daily at 10 pm into the skin.   meclizine 25 MG tablet Commonly known as:  ANTIVERT Take 1 tablet (25 mg total) by mouth 3 (three) times daily as needed for dizziness.   montelukast 10 MG tablet Commonly known as:  SINGULAIR TAKE (1) TABLET BY MOUTH AT  BEDTIME.   multivitamin with minerals Tabs tablet Take 1 tablet by mouth daily.   oxyCODONE-acetaminophen 5-325 MG tablet Commonly known as:  PERCOCET/ROXICET Take 1 tablet by mouth every 8 (eight) hours as needed for severe pain.   pantoprazole 40 MG tablet Commonly known as:  PROTONIX Take 1 tablet (40 mg total) by mouth daily.   potassium chloride SA 20 MEQ tablet Commonly known as:  K-DUR,KLOR-CON TAKE 1&1/2 TABLET BY MOUTH DAILY.   PROAIR HFA 108 (90 Base) MCG/ACT inhaler Generic drug:  albuterol INHALE 2 PUFFS  EVERY 6 HOURS AS NEEDED FOR SHORTNESS OF BREATH/WHEEZING.   rosuvastatin 40 MG tablet Commonly known as:  CRESTOR Take 1 tablet (40 mg total) by mouth daily.   silver sulfADIAZINE 1 % cream Commonly known as:  SILVADENE Apply 1 application topically daily.   SPIRIVA HANDIHALER 18 MCG inhalation capsule Generic drug:  tiotropium Place 18 mcg into inhaler and inhale daily. 2 puffs   thiamine 100 MG tablet Take 1 tablet (100 mg total) by mouth daily.   TRADJENTA 5 MG Tabs tablet Generic drug:  linagliptin TAKE 1 TABLET BY MOUTH ONCE A DAY.   traZODone 50 MG tablet Commonly known as:  DESYREL TAKE (1) TABLET BY MOUTH AT BEDTIME.   Vitamin D (Ergocalciferol) 50000 units Caps capsule Commonly known as:  DRISDOL Take 1 capsule (50,000 Units total) by mouth once a week.       Diagnostic Studies: No results found.  Patient benefited maximally from their hospital stay and there were no complications.     Disposition: 01-Home or Self Care Discharge Instructions    Call MD / Call 911   Complete by:  As directed    If you experience chest pain or shortness of breath, CALL 911 and be transported to the hospital emergency room.  If you develope a fever above 101 F, pus (white drainage) or increased drainage or redness at the wound, or calf pain, call your surgeon's office.   Call MD / Call 911   Complete by:  As directed    If you experience chest pain or  shortness of breath, CALL 911 and be transported to the hospital emergency room.  If you develope a fever above 101 F, pus (white drainage) or increased drainage or redness at the wound, or calf pain, call your surgeon's office.   Constipation Prevention   Complete by:  As directed    Drink plenty of fluids.  Prune juice may be helpful.  You may use a stool softener, such as Colace (over the counter) 100 mg twice a day.  Use MiraLax (over the counter) for constipation as needed.   Constipation Prevention   Complete by:  As directed    Drink plenty of fluids.  Prune juice may be helpful.  You may use a stool softener, such as Colace (over the counter) 100 mg twice a day.  Use MiraLax (over the counter) for constipation as needed.   Diet - low sodium heart healthy   Complete by:  As directed    Diet - low sodium heart healthy   Complete by:  As directed    Increase activity slowly as tolerated   Complete by:  As directed    Increase activity slowly as tolerated   Complete by:  As directed    Negative Pressure Wound Therapy - Incisional   Complete by:  As directed    Obtained 2 portable Praveena wound VAC from the operating room and removed the hospital wound VAC pumps and attached to the portable Praveena pumps.  This will remain in place for 1 week postoperatively.     Follow-up Information    Newt Minion, MD Follow up in 1 week(s).   Specialty:  Orthopedic Surgery Contact information: [address] [place] [address] [phone]            Signed: Newt Minion 10/01/2017, 6:53 AM

## 2017-10-02 ENCOUNTER — Emergency Department (HOSPITAL_COMMUNITY)
Admission: EM | Admit: 2017-10-02 | Discharge: 2017-10-03 | Disposition: A | Discharge status: Home / Self Care | Payer: Medicare Other | Attending: Emergency Medicine | Admitting: Emergency Medicine

## 2017-10-02 ENCOUNTER — Encounter (HOSPITAL_COMMUNITY): Payer: Self-pay | Admitting: Emergency Medicine

## 2017-10-02 DIAGNOSIS — N183 Chronic kidney disease, stage 3 (moderate): Secondary | ICD-10-CM | POA: Diagnosis not present

## 2017-10-02 DIAGNOSIS — Z4889 Encounter for other specified surgical aftercare: Secondary | ICD-10-CM | POA: Diagnosis present

## 2017-10-02 DIAGNOSIS — Z79899 Other long term (current) drug therapy: Secondary | ICD-10-CM | POA: Diagnosis not present

## 2017-10-02 DIAGNOSIS — Z7982 Long term (current) use of aspirin: Secondary | ICD-10-CM | POA: Insufficient documentation

## 2017-10-02 DIAGNOSIS — I130 Hypertensive heart and chronic kidney disease with heart failure and stage 1 through stage 4 chronic kidney disease, or unspecified chronic kidney disease: Secondary | ICD-10-CM | POA: Diagnosis not present

## 2017-10-02 DIAGNOSIS — E1122 Type 2 diabetes mellitus with diabetic chronic kidney disease: Secondary | ICD-10-CM | POA: Insufficient documentation

## 2017-10-02 DIAGNOSIS — J449 Chronic obstructive pulmonary disease, unspecified: Secondary | ICD-10-CM | POA: Diagnosis not present

## 2017-10-02 DIAGNOSIS — Z87891 Personal history of nicotine dependence: Secondary | ICD-10-CM | POA: Diagnosis not present

## 2017-10-02 DIAGNOSIS — I5042 Chronic combined systolic (congestive) and diastolic (congestive) heart failure: Secondary | ICD-10-CM | POA: Insufficient documentation

## 2017-10-02 DIAGNOSIS — Z7902 Long term (current) use of antithrombotics/antiplatelets: Secondary | ICD-10-CM | POA: Insufficient documentation

## 2017-10-02 MED ORDER — OXYCODONE-ACETAMINOPHEN 5-325 MG PO TABS
1.0000 | ORAL_TABLET | Freq: Once | ORAL | Status: AC
  Administered 2017-10-02: 1 via ORAL
  Filled 2017-10-02: qty 1

## 2017-10-02 NOTE — ED Triage Notes (Signed)
Per EMS, pt from [facility], pt has bilateral LE wound vac's that were placed yesterday. Pt reports that the right wound vac is not functioning properly. VSS, no other complaints.
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
## 2017-10-03 NOTE — ED Notes (Signed)
Report given to [place], EMT

## 2017-10-03 NOTE — Discharge Instructions (Signed)
You will need to follow up with Dr. Sharol Given on Monday, or as soon as possible due to weather, for wound dressing and wound vac care. Return to the emergency department with any new concerns.

## 2017-10-04 ENCOUNTER — Ambulatory Visit: Payer: Self-pay | Admitting: Adult Health

## 2017-10-05 ENCOUNTER — Encounter (HOSPITAL_BASED_OUTPATIENT_CLINIC_OR_DEPARTMENT_OTHER): Payer: Medicare Other

## 2017-10-05 ENCOUNTER — Ambulatory Visit (INDEPENDENT_AMBULATORY_CARE_PROVIDER_SITE_OTHER): Payer: Medicare Other | Admitting: Orthopedic Surgery

## 2017-10-05 ENCOUNTER — Encounter (INDEPENDENT_AMBULATORY_CARE_PROVIDER_SITE_OTHER): Payer: Self-pay | Admitting: Orthopedic Surgery

## 2017-10-05 DIAGNOSIS — I70263 Atherosclerosis of native arteries of extremities with gangrene, bilateral legs: Secondary | ICD-10-CM | POA: Diagnosis not present

## 2017-10-05 DIAGNOSIS — E1169 Type 2 diabetes mellitus with other specified complication: Secondary | ICD-10-CM | POA: Diagnosis not present

## 2017-10-05 DIAGNOSIS — I471 Supraventricular tachycardia: Secondary | ICD-10-CM | POA: Diagnosis not present

## 2017-10-05 DIAGNOSIS — I160 Hypertensive urgency: Secondary | ICD-10-CM | POA: Diagnosis not present

## 2017-10-05 MED ORDER — SILVER SULFADIAZINE 1 % EX CREA: 1.0000 "application " | TOPICAL_CREAM | Freq: Every day | CUTANEOUS | 3 refills | Status: DC
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
## 2017-10-07 ENCOUNTER — Other Ambulatory Visit: Payer: Self-pay | Admitting: *Deleted

## 2017-10-07 DIAGNOSIS — L97321 Non-pressure chronic ulcer of left ankle limited to breakdown of skin: Secondary | ICD-10-CM

## 2017-10-07 DIAGNOSIS — L97221 Non-pressure chronic ulcer of left calf limited to breakdown of skin: Secondary | ICD-10-CM | POA: Diagnosis not present

## 2017-10-07 DIAGNOSIS — L97211 Non-pressure chronic ulcer of right calf limited to breakdown of skin: Secondary | ICD-10-CM | POA: Diagnosis not present

## 2017-10-07 DIAGNOSIS — E1151 Type 2 diabetes mellitus with diabetic peripheral angiopathy without gangrene: Secondary | ICD-10-CM

## 2017-10-07 DIAGNOSIS — L97821 Non-pressure chronic ulcer of other part of left lower leg limited to breakdown of skin: Secondary | ICD-10-CM | POA: Diagnosis not present

## 2017-10-07 DIAGNOSIS — E11622 Type 2 diabetes mellitus with other skin ulcer: Secondary | ICD-10-CM | POA: Diagnosis not present

## 2017-10-07 NOTE — Patient Outreach (Signed)
[facility]) Care Management  10/07/2017  PETREA FREDENBURG [birth year] [id number]   CSW attempted to call patient but no answer & kept ringing (ph#: [phone] & [phone]). CSW called & spoke with patient's sister, [name] (ph#: [phone]) to confirm that patient is currently at [facility] and has been pleased with the care she has been receiving. Patient has been told that once Medicaid has been approved, that she would be able to stay there long term. Patient's sister reports that she had dropped off requested items to DSS worker, [name] yesterday and is just waiting for a form to come in the mail from her life insurance.   CSW will check back with patient in 3 weeks to ensure that Medicaid has been completely submitted.    Raynaldo Opitz, LCSW [facility]  Clinical Social Worker cell #: [phone]

## 2017-10-11 DIAGNOSIS — Z09 Encounter for follow-up examination after completed treatment for conditions other than malignant neoplasm: Secondary | ICD-10-CM | POA: Insufficient documentation

## 2017-10-11 DIAGNOSIS — E1169 Type 2 diabetes mellitus with other specified complication: Secondary | ICD-10-CM | POA: Diagnosis not present

## 2017-10-11 DIAGNOSIS — I10 Essential (primary) hypertension: Secondary | ICD-10-CM | POA: Diagnosis not present

## 2017-10-11 DIAGNOSIS — R339 Retention of urine, unspecified: Secondary | ICD-10-CM | POA: Diagnosis not present

## 2017-10-11 DIAGNOSIS — I70263 Atherosclerosis of native arteries of extremities with gangrene, bilateral legs: Secondary | ICD-10-CM | POA: Diagnosis not present

## 2017-10-11 NOTE — Assessment & Plan Note (Signed)
Tramadol prescribed for pain at same dose she has been on in the past, no change

## 2017-10-11 NOTE — Assessment & Plan Note (Signed)
Sub optimal but improved control, medication adjustment at next visit,

## 2017-10-11 NOTE — Assessment & Plan Note (Signed)
Bilateral lower exremity cellulitis requiriing in patient treatment for 2 days, followed by SNF placement for for 30 days. Stil has lower open wounds and current living situation is far from ideal. Case management is working aggressively attempting to get her into a safer and healthier environment once more, which she is agreeable to
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
## 2017-10-11 NOTE — Assessment & Plan Note (Addendum)
[name] is reminded of the importance of commitment to daily physical activity for 30 minutes or more, as able and the need to limit carbohydrate intake to 30 to 60 grams per meal to help with blood sugar control.   The need to take medication as prescribed, test blood sugar as directed, and to call between visits if there is a concern that blood sugar is uncontrolled is also discussed.  UNCONTROLLED, non compliant, HBA1C over 15.5 in 07/2017, needs to be treated by endocrinology, pt has not been in to the office for 2018  [name] is reminded of the importance of daily foot exam, annual eye examination, and good blood sugar, blood pressure and cholesterol control.  Diabetic Labs Latest Ref Rng & Units 09/27/2017 09/25/2017 09/23/2017 09/14/2017 08/27/2017  HbA1c 4.8 - 5.6 % - - - - -  Microalbumin Not estab mg/dL - - - - -  Micro/Creat Ratio <30 mcg/mg creat - - - - -  Chol 0 - 200 mg/dL - - - - -  HDL >40 mg/dL - - - - -  Calc LDL 0 - 99 mg/dL - - - - -  Triglycerides <150 mg/dL - - - - -  Creatinine 0.44 - 1.00 mg/dL 1.58(H) 2.24(H) 2.55(H) 1.45(H) 1.84(H)  GFR >60.00 mL/min - - - - -   BP/Weight 10/03/2017 10/01/2017 09/22/2017 09/14/2017 09/13/2017 09/06/2017 35/01/5624  Systolic BP 638 937 - 342 - 876 811  Diastolic BP 97 572 - 620 - 90 105  Wt. (Lbs) - - 130 130 138 - 138  BMI - - 24.56 24.56 26.07 26.07 26.07  Some encounter information is confidential and restricted. Go to Review Flowsheets activity to see all data.   Foot/eye exam completion dates Latest Ref Rng & Units 03/16/2017 06/18/2016  Eye Exam No Retinopathy Retinopathy(A) -  Foot exam Order - - -  Foot Form Completion - - Done

## 2017-10-14 DIAGNOSIS — R330 Drug induced retention of urine: Secondary | ICD-10-CM | POA: Diagnosis not present

## 2017-10-15 ENCOUNTER — Encounter: Payer: Self-pay | Admitting: *Deleted

## 2017-10-21 DIAGNOSIS — I5042 Chronic combined systolic (congestive) and diastolic (congestive) heart failure: Secondary | ICD-10-CM | POA: Diagnosis not present

## 2017-10-21 DIAGNOSIS — I160 Hypertensive urgency: Secondary | ICD-10-CM | POA: Diagnosis not present

## 2017-10-21 DIAGNOSIS — R339 Retention of urine, unspecified: Secondary | ICD-10-CM | POA: Diagnosis not present

## 2017-10-21 DIAGNOSIS — I70263 Atherosclerosis of native arteries of extremities with gangrene, bilateral legs: Secondary | ICD-10-CM | POA: Diagnosis not present

## 2017-10-22 DIAGNOSIS — R330 Drug induced retention of urine: Secondary | ICD-10-CM | POA: Diagnosis not present

## 2017-10-25 ENCOUNTER — Encounter (HOSPITAL_COMMUNITY): Payer: Self-pay | Admitting: Orthopedic Surgery

## 2017-10-27 ENCOUNTER — Ambulatory Visit (INDEPENDENT_AMBULATORY_CARE_PROVIDER_SITE_OTHER): Payer: Self-pay | Admitting: Orthopedic Surgery

## 2017-10-27 ENCOUNTER — Telehealth (INDEPENDENT_AMBULATORY_CARE_PROVIDER_SITE_OTHER): Payer: Self-pay

## 2017-10-27 DIAGNOSIS — E785 Hyperlipidemia, unspecified: Secondary | ICD-10-CM | POA: Diagnosis not present

## 2017-10-27 DIAGNOSIS — J449 Chronic obstructive pulmonary disease, unspecified: Secondary | ICD-10-CM | POA: Diagnosis not present

## 2017-10-27 NOTE — Telephone Encounter (Signed)
Facility [name] is down and they are not able to make appt today. Verbal ok to remove stitches at facility and appt was made for 11/02/17 at 2:30 for follow up. Will call with questions.

## 2017-10-28 ENCOUNTER — Other Ambulatory Visit: Payer: Self-pay | Admitting: *Deleted

## 2017-10-28 DIAGNOSIS — J449 Chronic obstructive pulmonary disease, unspecified: Secondary | ICD-10-CM | POA: Diagnosis not present

## 2017-10-28 DIAGNOSIS — I10 Essential (primary) hypertension: Secondary | ICD-10-CM | POA: Diagnosis not present

## 2017-10-28 DIAGNOSIS — R339 Retention of urine, unspecified: Secondary | ICD-10-CM | POA: Diagnosis not present

## 2017-10-28 DIAGNOSIS — I70263 Atherosclerosis of native arteries of extremities with gangrene, bilateral legs: Secondary | ICD-10-CM | POA: Diagnosis not present

## 2017-10-28 NOTE — Patient Outreach (Signed)
[facility]) Care Management  10/28/2017  [name] Oct 30, 1965 [id number]   CSW called & spoke with patient's sister, [name] who informed CSW that she was able to get all the requested documents back to [place], [name] [phone]) and that the application is still pending. Patient's sister though is very hopeful that it will get approved as she has had Medicaid in the past but it had lapsed.   Patient sister states that she goes back & forth whether she wants to stay long term at [facility] or move into an apartment by herself but that patient is unable to take care of herself & was unhappy when living with her son, [name]. Patient's sister states that she feels that patient will stay at [facility] long term.   CSW will perform a case closure on patient, as all goals of treatment have been met from social work standpoint and no additional social work needs have been identified at this time.   CSW will fax an update to patient's Primary Care Physician, Dr. Tula Nakayama to ensure that they are aware of CSW's involvement with patient's plan of care. CSW will submit a case closure request to Verlon Setting, Care Management Assistant with [facility] in the form of an inbasket message.    Raynaldo Opitz, LCSW [facility]  Clinical Social Worker cell #: [phone]

## 2017-10-29 DIAGNOSIS — E46 Unspecified protein-calorie malnutrition: Secondary | ICD-10-CM | POA: Diagnosis not present

## 2017-10-29 DIAGNOSIS — S81802A Unspecified open wound, left lower leg, initial encounter: Secondary | ICD-10-CM | POA: Diagnosis not present

## 2017-10-29 DIAGNOSIS — I739 Peripheral vascular disease, unspecified: Secondary | ICD-10-CM | POA: Diagnosis not present

## 2017-10-29 DIAGNOSIS — I10 Essential (primary) hypertension: Secondary | ICD-10-CM | POA: Diagnosis not present

## 2017-10-29 DIAGNOSIS — S81801A Unspecified open wound, right lower leg, initial encounter: Secondary | ICD-10-CM | POA: Diagnosis not present

## 2017-10-30 DIAGNOSIS — I509 Heart failure, unspecified: Secondary | ICD-10-CM | POA: Diagnosis not present

## 2017-10-30 DIAGNOSIS — I10 Essential (primary) hypertension: Secondary | ICD-10-CM | POA: Diagnosis not present

## 2017-10-30 DIAGNOSIS — E785 Hyperlipidemia, unspecified: Secondary | ICD-10-CM | POA: Diagnosis not present

## 2017-11-02 ENCOUNTER — Encounter (INDEPENDENT_AMBULATORY_CARE_PROVIDER_SITE_OTHER): Payer: Self-pay | Admitting: Orthopedic Surgery

## 2017-11-02 ENCOUNTER — Telehealth (INDEPENDENT_AMBULATORY_CARE_PROVIDER_SITE_OTHER): Payer: Self-pay | Admitting: Orthopedic Surgery

## 2017-11-02 ENCOUNTER — Ambulatory Visit (INDEPENDENT_AMBULATORY_CARE_PROVIDER_SITE_OTHER): Payer: Medicare Other | Admitting: Orthopedic Surgery

## 2017-11-02 VITALS — Ht 61.0 in | Wt 130.0 lb

## 2017-11-02 DIAGNOSIS — I70263 Atherosclerosis of native arteries of extremities with gangrene, bilateral legs: Secondary | ICD-10-CM

## 2017-11-02 NOTE — Telephone Encounter (Signed)
Patient's sister [name] called concerning her sister's appointment today. Junious Dresser advised she could not be with her so she is asking if someone would call her and update her on the appointment today. The number to contact Junious Dresser is [phone]
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
## 2017-11-03 DIAGNOSIS — J449 Chronic obstructive pulmonary disease, unspecified: Secondary | ICD-10-CM | POA: Diagnosis not present

## 2017-11-03 DIAGNOSIS — I10 Essential (primary) hypertension: Secondary | ICD-10-CM | POA: Diagnosis not present

## 2017-11-03 DIAGNOSIS — I70263 Atherosclerosis of native arteries of extremities with gangrene, bilateral legs: Secondary | ICD-10-CM | POA: Diagnosis not present

## 2017-11-03 DIAGNOSIS — I5042 Chronic combined systolic (congestive) and diastolic (congestive) heart failure: Secondary | ICD-10-CM | POA: Diagnosis not present

## 2017-11-03 NOTE — Telephone Encounter (Signed)
Sister comes with pt to all appts and was not able to attend yesterday. Advised she is now to have adaptic and dry dressing applied to ble and that we will follow up with her in the office in 2 weeks.

## 2017-11-11 DIAGNOSIS — E1169 Type 2 diabetes mellitus with other specified complication: Secondary | ICD-10-CM | POA: Diagnosis not present

## 2017-11-11 DIAGNOSIS — R339 Retention of urine, unspecified: Secondary | ICD-10-CM | POA: Diagnosis not present

## 2017-11-11 DIAGNOSIS — I10 Essential (primary) hypertension: Secondary | ICD-10-CM | POA: Diagnosis not present

## 2017-11-11 DIAGNOSIS — I70263 Atherosclerosis of native arteries of extremities with gangrene, bilateral legs: Secondary | ICD-10-CM | POA: Diagnosis not present

## 2017-11-12 DIAGNOSIS — I509 Heart failure, unspecified: Secondary | ICD-10-CM | POA: Diagnosis not present

## 2017-11-18 ENCOUNTER — Encounter (INDEPENDENT_AMBULATORY_CARE_PROVIDER_SITE_OTHER): Payer: Self-pay | Admitting: Orthopedic Surgery

## 2017-11-18 ENCOUNTER — Ambulatory Visit (INDEPENDENT_AMBULATORY_CARE_PROVIDER_SITE_OTHER): Payer: Medicare Other | Admitting: Orthopedic Surgery

## 2017-11-18 VITALS — Ht 61.0 in | Wt 130.0 lb

## 2017-11-18 DIAGNOSIS — I70263 Atherosclerosis of native arteries of extremities with gangrene, bilateral legs: Secondary | ICD-10-CM

## 2017-11-18 DIAGNOSIS — J449 Chronic obstructive pulmonary disease, unspecified: Secondary | ICD-10-CM | POA: Diagnosis not present

## 2017-11-18 DIAGNOSIS — I10 Essential (primary) hypertension: Secondary | ICD-10-CM | POA: Diagnosis not present

## 2017-11-18 DIAGNOSIS — I5042 Chronic combined systolic (congestive) and diastolic (congestive) heart failure: Secondary | ICD-10-CM | POA: Diagnosis not present
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
## 2017-11-26 ENCOUNTER — Telehealth: Payer: Self-pay | Admitting: Family Medicine

## 2017-11-26 NOTE — Telephone Encounter (Signed)
Do we have this sister's  Name on her record at all?  If not please contact the name of the family  on record, verify that this is indeed the case and then OK to write a letter to this effect

## 2017-11-26 NOTE — Telephone Encounter (Signed)
Patient's sister [name] called in to request a new letter to be faxed to patients case work at [employer] Her medicaid claim was denied. They stated that is needs to say that it is detrimental to patients health not to have medicaid.  (the last letter written states that it is potentially life threatening)   [name] (case worker) Phone#: [phone] ext. [phone] Fax#: [phone]  [name] (sister) [phone]

## 2017-11-27 DIAGNOSIS — J449 Chronic obstructive pulmonary disease, unspecified: Secondary | ICD-10-CM | POA: Diagnosis not present

## 2017-11-29 NOTE — Telephone Encounter (Signed)
Faxed new letter

## 2017-11-30 DIAGNOSIS — E785 Hyperlipidemia, unspecified: Secondary | ICD-10-CM | POA: Diagnosis not present

## 2017-11-30 DIAGNOSIS — I10 Essential (primary) hypertension: Secondary | ICD-10-CM | POA: Diagnosis not present

## 2017-11-30 DIAGNOSIS — I509 Heart failure, unspecified: Secondary | ICD-10-CM | POA: Diagnosis not present

## 2017-12-02 ENCOUNTER — Encounter (INDEPENDENT_AMBULATORY_CARE_PROVIDER_SITE_OTHER): Payer: Self-pay | Admitting: Family

## 2017-12-02 ENCOUNTER — Ambulatory Visit (INDEPENDENT_AMBULATORY_CARE_PROVIDER_SITE_OTHER): Payer: Medicare Other | Admitting: Family

## 2017-12-02 ENCOUNTER — Ambulatory Visit (INDEPENDENT_AMBULATORY_CARE_PROVIDER_SITE_OTHER): Payer: Self-pay | Admitting: Orthopedic Surgery

## 2017-12-02 DIAGNOSIS — S91001A Unspecified open wound, right ankle, initial encounter: Secondary | ICD-10-CM

## 2017-12-02 DIAGNOSIS — L03116 Cellulitis of left lower limb: Secondary | ICD-10-CM

## 2017-12-02 DIAGNOSIS — L97929 Non-pressure chronic ulcer of unspecified part of left lower leg with unspecified severity: Secondary | ICD-10-CM

## 2017-12-02 DIAGNOSIS — S81001A Unspecified open wound, right knee, initial encounter: Secondary | ICD-10-CM

## 2017-12-02 DIAGNOSIS — L03115 Cellulitis of right lower limb: Secondary | ICD-10-CM

## 2017-12-02 DIAGNOSIS — S81801A Unspecified open wound, right lower leg, initial encounter: Secondary | ICD-10-CM

## 2017-12-02 DIAGNOSIS — L97919 Non-pressure chronic ulcer of unspecified part of right lower leg with unspecified severity: Secondary | ICD-10-CM
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
## 2017-12-03 DIAGNOSIS — E46 Unspecified protein-calorie malnutrition: Secondary | ICD-10-CM | POA: Diagnosis not present

## 2017-12-03 DIAGNOSIS — S81802A Unspecified open wound, left lower leg, initial encounter: Secondary | ICD-10-CM | POA: Diagnosis not present

## 2017-12-03 DIAGNOSIS — S81801A Unspecified open wound, right lower leg, initial encounter: Secondary | ICD-10-CM | POA: Diagnosis not present

## 2017-12-03 DIAGNOSIS — I509 Heart failure, unspecified: Secondary | ICD-10-CM | POA: Diagnosis not present

## 2017-12-03 DIAGNOSIS — R339 Retention of urine, unspecified: Secondary | ICD-10-CM | POA: Diagnosis not present

## 2017-12-12 DIAGNOSIS — I5042 Chronic combined systolic (congestive) and diastolic (congestive) heart failure: Secondary | ICD-10-CM | POA: Diagnosis not present

## 2017-12-12 DIAGNOSIS — I10 Essential (primary) hypertension: Secondary | ICD-10-CM | POA: Diagnosis not present

## 2017-12-12 DIAGNOSIS — I70263 Atherosclerosis of native arteries of extremities with gangrene, bilateral legs: Secondary | ICD-10-CM | POA: Diagnosis not present

## 2017-12-12 DIAGNOSIS — M25512 Pain in left shoulder: Secondary | ICD-10-CM | POA: Diagnosis not present

## 2017-12-13 ENCOUNTER — Telehealth: Payer: Self-pay | Admitting: Family Medicine

## 2017-12-13 DIAGNOSIS — I509 Heart failure, unspecified: Secondary | ICD-10-CM | POA: Diagnosis not present

## 2017-12-13 NOTE — Telephone Encounter (Signed)
[name] patients sister called in to verify that request for letter was completed.

## 2017-12-16 ENCOUNTER — Ambulatory Visit (INDEPENDENT_AMBULATORY_CARE_PROVIDER_SITE_OTHER): Payer: Medicare Other | Admitting: Orthopedic Surgery

## 2017-12-16 ENCOUNTER — Encounter (INDEPENDENT_AMBULATORY_CARE_PROVIDER_SITE_OTHER): Payer: Self-pay | Admitting: Orthopedic Surgery

## 2017-12-16 DIAGNOSIS — S81801A Unspecified open wound, right lower leg, initial encounter: Secondary | ICD-10-CM

## 2017-12-16 DIAGNOSIS — L97929 Non-pressure chronic ulcer of unspecified part of left lower leg with unspecified severity: Secondary | ICD-10-CM

## 2017-12-16 DIAGNOSIS — E1152 Type 2 diabetes mellitus with diabetic peripheral angiopathy with gangrene: Secondary | ICD-10-CM

## 2017-12-16 DIAGNOSIS — S81001A Unspecified open wound, right knee, initial encounter: Secondary | ICD-10-CM

## 2017-12-16 DIAGNOSIS — E11621 Type 2 diabetes mellitus with foot ulcer: Secondary | ICD-10-CM

## 2017-12-16 DIAGNOSIS — L97919 Non-pressure chronic ulcer of unspecified part of right lower leg with unspecified severity: Secondary | ICD-10-CM

## 2017-12-16 DIAGNOSIS — S91001A Unspecified open wound, right ankle, initial encounter: Secondary | ICD-10-CM

## 2017-12-16 DIAGNOSIS — L97509 Non-pressure chronic ulcer of other part of unspecified foot with unspecified severity: Secondary | ICD-10-CM

## 2017-12-16 IMAGING — CR DG CHEST 1V PORT
1 series · 1 of 1 positions shown · non-contrast
Comparison: Chest radiograph dated 04/03/2015

CLINICAL DATA: 50-year-old female with shortness of breath

EXAM:
PORTABLE CHEST 1 VIEW

[ap portable]
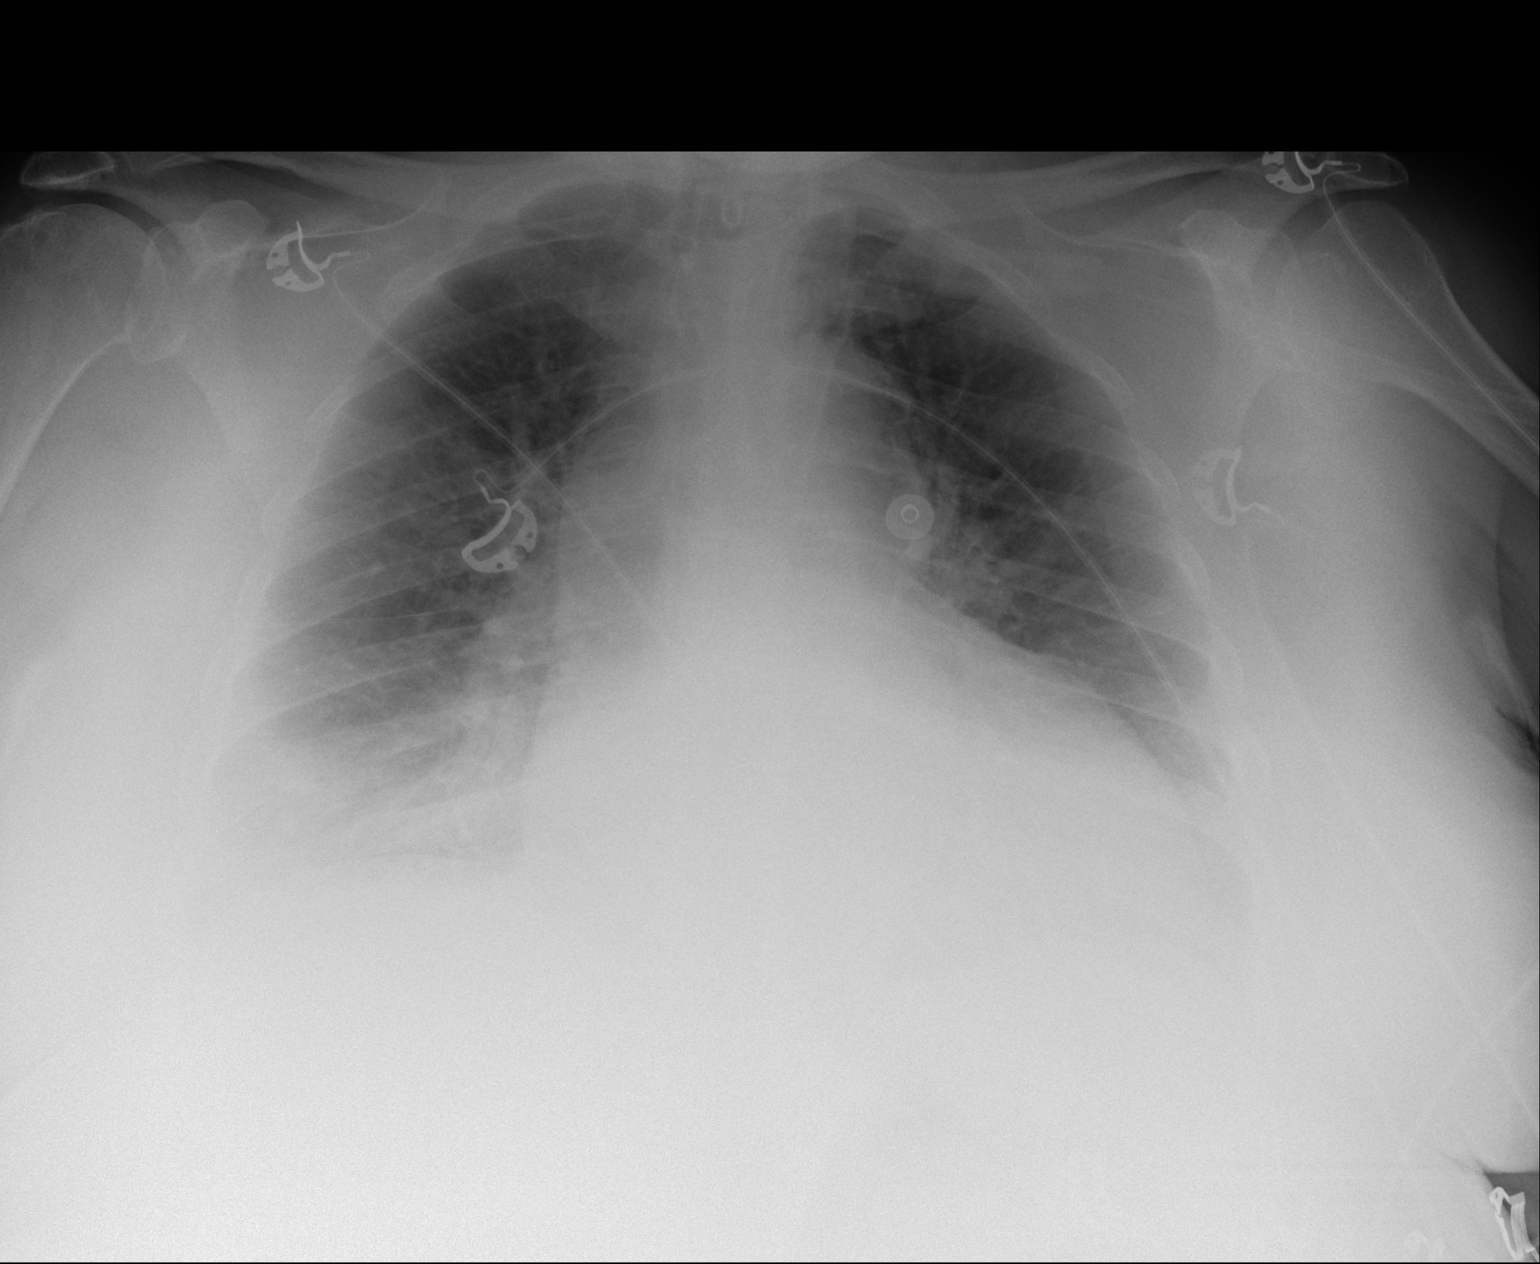

[1 of 1 positions shown; findings below may reference images not displayed]

FINDINGS: Shallow inspiration with bibasilar subsegmental atelectatic changes.
Pneumonia is less likely. Clinical correlation is recommended. No
focal consolidation, pleural effusion, or pneumothorax. Mild
cardiomegaly. No acute osseous pathology.
IMPRESSION: No acute cardiopulmonary process.

Mild cardiomegaly.
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
## 2017-12-20 ENCOUNTER — Telehealth: Payer: Self-pay | Admitting: Family Medicine

## 2017-12-20 NOTE — Telephone Encounter (Signed)
Patient called to talk about her medicaid approval, I let her know that Dr.Simpson has sent letters to [facility]. She wants to make sure that the letters say that she needs to be in a long term facility because she has no where else to go. She wants to speak to [facility]. Cb#: [phone]

## 2017-12-21 ENCOUNTER — Telehealth: Payer: Self-pay | Admitting: Family Medicine

## 2017-12-21 DIAGNOSIS — I10 Essential (primary) hypertension: Secondary | ICD-10-CM | POA: Diagnosis not present

## 2017-12-21 DIAGNOSIS — I70263 Atherosclerosis of native arteries of extremities with gangrene, bilateral legs: Secondary | ICD-10-CM | POA: Diagnosis not present

## 2017-12-21 DIAGNOSIS — E1169 Type 2 diabetes mellitus with other specified complication: Secondary | ICD-10-CM | POA: Diagnosis not present

## 2017-12-21 DIAGNOSIS — N183 Chronic kidney disease, stage 3 (moderate): Secondary | ICD-10-CM | POA: Diagnosis not present

## 2017-12-21 NOTE — Telephone Encounter (Signed)
The 2nd requested letter to [facility] was sent and now she is requesting letter stating that she needs to be in a long term facility (The previous letters were to qualify her medicaid approval) I don't think stating she needs to be in a long term facility would do any good for medicaid approval but I will type whatever you want me to.

## 2017-12-21 NOTE — Telephone Encounter (Signed)
Letter faxed.

## 2017-12-21 NOTE — Telephone Encounter (Signed)
Letter sent.

## 2017-12-21 NOTE — Telephone Encounter (Signed)
[name] - [phone] [phone]   Requesting a new letter for patient stating it is detremental to patients health to be without skilled nursing care.   She states she last letter did not have a doctors signature.

## 2017-12-21 NOTE — Telephone Encounter (Signed)
PLease do type a letter stating that she needs to be in a long term facility because of chronic medical conditions Thanks, I will sign

## 2017-12-22 ENCOUNTER — Ambulatory Visit: Payer: Self-pay | Admitting: Family Medicine

## 2017-12-24 DIAGNOSIS — S81802A Unspecified open wound, left lower leg, initial encounter: Secondary | ICD-10-CM | POA: Diagnosis not present

## 2017-12-24 DIAGNOSIS — I4891 Unspecified atrial fibrillation: Secondary | ICD-10-CM | POA: Diagnosis not present

## 2017-12-24 DIAGNOSIS — S81801A Unspecified open wound, right lower leg, initial encounter: Secondary | ICD-10-CM | POA: Diagnosis not present

## 2017-12-24 DIAGNOSIS — R531 Weakness: Secondary | ICD-10-CM | POA: Diagnosis not present

## 2017-12-24 DIAGNOSIS — I509 Heart failure, unspecified: Secondary | ICD-10-CM | POA: Diagnosis not present

## 2017-12-25 DIAGNOSIS — J449 Chronic obstructive pulmonary disease, unspecified: Secondary | ICD-10-CM | POA: Diagnosis not present

## 2017-12-28 DIAGNOSIS — I10 Essential (primary) hypertension: Secondary | ICD-10-CM | POA: Diagnosis not present

## 2017-12-28 DIAGNOSIS — E785 Hyperlipidemia, unspecified: Secondary | ICD-10-CM | POA: Diagnosis not present

## 2017-12-28 DIAGNOSIS — I509 Heart failure, unspecified: Secondary | ICD-10-CM | POA: Diagnosis not present

## 2018-01-04 DIAGNOSIS — I70263 Atherosclerosis of native arteries of extremities with gangrene, bilateral legs: Secondary | ICD-10-CM | POA: Diagnosis not present

## 2018-01-04 DIAGNOSIS — I10 Essential (primary) hypertension: Secondary | ICD-10-CM | POA: Diagnosis not present

## 2018-01-04 DIAGNOSIS — I5042 Chronic combined systolic (congestive) and diastolic (congestive) heart failure: Secondary | ICD-10-CM | POA: Diagnosis not present

## 2018-01-04 DIAGNOSIS — E1169 Type 2 diabetes mellitus with other specified complication: Secondary | ICD-10-CM | POA: Diagnosis not present

## 2018-01-06 ENCOUNTER — Ambulatory Visit (INDEPENDENT_AMBULATORY_CARE_PROVIDER_SITE_OTHER): Payer: Self-pay | Admitting: Orthopedic Surgery

## 2018-01-06 DIAGNOSIS — I10 Essential (primary) hypertension: Secondary | ICD-10-CM | POA: Diagnosis not present

## 2018-01-06 DIAGNOSIS — I70263 Atherosclerosis of native arteries of extremities with gangrene, bilateral legs: Secondary | ICD-10-CM | POA: Diagnosis not present

## 2018-01-06 DIAGNOSIS — E1169 Type 2 diabetes mellitus with other specified complication: Secondary | ICD-10-CM | POA: Diagnosis not present

## 2018-01-10 DIAGNOSIS — I509 Heart failure, unspecified: Secondary | ICD-10-CM | POA: Diagnosis not present

## 2018-01-14 ENCOUNTER — Telehealth: Payer: Self-pay | Admitting: Family Medicine

## 2018-01-14 MED ORDER — INSULIN DETEMIR 100 UNIT/ML ~~LOC~~ SOLN: 8.0000 [IU] | Freq: Every day | SUBCUTANEOUS | 0 refills | Status: DC

## 2018-01-14 NOTE — Telephone Encounter (Signed)
Patient called in requesting to switch her appt til Wednesday due to transportation. We do not have availability, she will call back with their # so we can fax a request to transportation that patient needs to keep this appt

## 2018-01-14 NOTE — Telephone Encounter (Signed)
Patient states she was discharged from [facility] in [place].  She is requesting her long last night time insulin. She only has the pen that they gave her from rehab.  Pharmacy: [facility] Cb#: [phone]

## 2018-01-18 ENCOUNTER — Telehealth: Payer: Self-pay | Admitting: Family Medicine

## 2018-01-18 ENCOUNTER — Ambulatory Visit: Payer: Self-pay | Admitting: Family Medicine

## 2018-01-18 NOTE — Telephone Encounter (Signed)
Patients family member called stating transportation did not show up to bring her to appt. Betsy faxed them letting them know we needed her here today and had to available appts for reschedule. He states she has not seen anyone since she left rehab and that she needs her wounds looked at. I advised her to call Dr.Duda for wound treatment because she had an appt on 01/06/18 that was a no show.  I let her know I would send a msg to you and Dr.Simpson with her concerns.

## 2018-01-18 NOTE — Telephone Encounter (Signed)
Called patient to offer appt at 2:20 due to cancellation .

## 2018-01-19 NOTE — Telephone Encounter (Signed)
No show twice yesterday

## 2018-01-20 ENCOUNTER — Encounter (INDEPENDENT_AMBULATORY_CARE_PROVIDER_SITE_OTHER): Payer: Self-pay | Admitting: Orthopedic Surgery

## 2018-01-20 ENCOUNTER — Ambulatory Visit (INDEPENDENT_AMBULATORY_CARE_PROVIDER_SITE_OTHER): Payer: Medicare Other | Admitting: Orthopedic Surgery

## 2018-01-20 VITALS — Ht 61.0 in | Wt 130.0 lb

## 2018-01-20 DIAGNOSIS — L97509 Non-pressure chronic ulcer of other part of unspecified foot with unspecified severity: Secondary | ICD-10-CM

## 2018-01-20 DIAGNOSIS — I70263 Atherosclerosis of native arteries of extremities with gangrene, bilateral legs: Secondary | ICD-10-CM

## 2018-01-20 DIAGNOSIS — E11621 Type 2 diabetes mellitus with foot ulcer: Secondary | ICD-10-CM

## 2018-01-20 DIAGNOSIS — E1152 Type 2 diabetes mellitus with diabetic peripheral angiopathy with gangrene: Secondary | ICD-10-CM

## 2018-01-20 MED ORDER — OXYCODONE-ACETAMINOPHEN 5-325 MG PO TABS: 1.0000 | ORAL_TABLET | Freq: Three times a day (TID) | ORAL | 0 refills | Status: DC | PRN
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
## 2018-01-25 DIAGNOSIS — J449 Chronic obstructive pulmonary disease, unspecified: Secondary | ICD-10-CM | POA: Diagnosis not present

## 2018-01-28 DIAGNOSIS — E785 Hyperlipidemia, unspecified: Secondary | ICD-10-CM | POA: Diagnosis not present

## 2018-01-28 DIAGNOSIS — I509 Heart failure, unspecified: Secondary | ICD-10-CM | POA: Diagnosis not present

## 2018-01-28 DIAGNOSIS — I10 Essential (primary) hypertension: Secondary | ICD-10-CM | POA: Diagnosis not present

## 2018-02-07 ENCOUNTER — Ambulatory Visit (INDEPENDENT_AMBULATORY_CARE_PROVIDER_SITE_OTHER): Payer: Medicare Other | Admitting: Family Medicine

## 2018-02-07 ENCOUNTER — Encounter: Payer: Self-pay | Admitting: Family Medicine

## 2018-02-07 ENCOUNTER — Other Ambulatory Visit: Payer: Self-pay

## 2018-02-07 VITALS — BP 136/84 | HR 77 | Resp 16 | Ht 61.0 in | Wt 143.0 lb

## 2018-02-07 DIAGNOSIS — E1129 Type 2 diabetes mellitus with other diabetic kidney complication: Secondary | ICD-10-CM | POA: Diagnosis not present

## 2018-02-07 DIAGNOSIS — Z9181 History of falling: Secondary | ICD-10-CM | POA: Diagnosis not present

## 2018-02-07 DIAGNOSIS — Z1231 Encounter for screening mammogram for malignant neoplasm of breast: Secondary | ICD-10-CM | POA: Diagnosis not present

## 2018-02-07 DIAGNOSIS — E1165 Type 2 diabetes mellitus with hyperglycemia: Secondary | ICD-10-CM | POA: Diagnosis not present

## 2018-02-07 DIAGNOSIS — R29898 Other symptoms and signs involving the musculoskeletal system: Secondary | ICD-10-CM | POA: Diagnosis not present

## 2018-02-07 DIAGNOSIS — I10 Essential (primary) hypertension: Secondary | ICD-10-CM | POA: Diagnosis not present

## 2018-02-07 DIAGNOSIS — IMO0002 Reserved for concepts with insufficient information to code with codable children: Secondary | ICD-10-CM

## 2018-02-07 DIAGNOSIS — E785 Hyperlipidemia, unspecified: Secondary | ICD-10-CM

## 2018-02-07 NOTE — Progress Notes (Signed)
[name]     MRN: [id number]      DOB: 10-13-1966   HPI [name] is here for follow up from [facility] when she was d/c on March 18 following hospitalization ending Deceber 2018 Was in [facility] faciliy, dx with hypertensive urgency Started first in August with CVA and MI and has been in facilities since that time. She has also had significant problems with bilateral gangrene of her legs due to arthrosclerosis and has been closely followed and managed by vascular surgeon and Podiatry Photo of feet in 12/2017 show healing ulcers and long toenails with onychomycosis She c/o bilateral lower extremity weakness and recurrent near falls requests pT to reduce fall risk and improve safety and independence , this will be for in home as she needs to be physically transported to visits  Denies polyuria, polydipsia, blurred vision , or hypoglycemic episodes. Denies nicotine or any substance use ROS Denies recent fever or chills. Denies sinus pressure, nasal congestion, ear pain or sore throat. Denies chest congestion, productive cough or wheezing. Denies chest pains, palpitations and leg swelling Denies abdominal pain, nausea, vomiting,diarrhea or constipation.   Denies dysuria, frequency, hesitancy or incontinence.  Denies headaches, seizures, chronic  Denies uncontrolled depression, anxiety or insomnia.    PE  BP 136/84   Pulse 77   Resp 16   Ht 5\' 1"  (1.549 m)   Wt 143 lb (64.9 kg)   LMP 05/19/2016   SpO2 98%   BMI 27.02 kg/m   Patient alert and oriented and in no cardiopulmonary distress.  HEENT: No facial asymmetry, EOMI,   oropharynx pink and moist.  Neck supple no JVD, no mass.  Chest: Clear to auscultation bilaterally.  CVS: S1, S2 no murmurs, no S3.Regular rate.  ABD: Soft non tender.   Ext: trace edema  OE:UMPNTIRWE   ROM spine, shoulders, hips and knees.  Skin:  ulcerations of dorsum of both feet noted in  photod Psych: Good eye contact, normal affect. Memory intact not anxious or depressed appearing.  CNS: CN 2-12 intact, grade 4 power in lower extremities with decreased sensation Assessment & Plan  HTN (hypertension), malignant Controlled, needs to continue current medication, need to verify with pharmacy what she is current;ly taking , she has not brought her meidcations to the visit  DM (diabetes mellitus), type 2, uncontrolled, with renal complications ([place]) Controlled, no change in medication, need to verify current medication [name] is reminded of the importance of commitment to daily physical activity for 30 minutes or more, as able and the need to limit carbohydrate intake to 30 to 60 grams per meal to help with blood sugar control.   The need to take medication as prescribed, test blood sugar as directed, and to call between visits if there is a concern that blood sugar is uncontrolled is also discussed.   [name] is reminded of the importance of daily foot exam, annual eye examination, and good blood sugar, blood pressure and cholesterol control.  Diabetic Labs Latest Ref Rng & Units 02/09/2018 09/27/2017 09/25/2017 09/23/2017 09/14/2017  HbA1c <5.7 % of total Hgb 7.2(H) - - - -  Microalbumin Not estab mg/dL - - - - -  Micro/Creat Ratio <30 mcg/mg creat - - - - -  Chol <200 mg/dL 141 - - - -  HDL >50 mg/dL 47(L) - - - -  Calc LDL mg/dL (calc) 74 - - - -  Triglycerides <150 mg/dL 110 - - - -  Creatinine 0.50 - 1.05 mg/dL 1.60(H) 1.58(H) 2.24(H) 2.55(H) 1.45(H)  GFR >60.00 mL/min - - - - -   BP/Weight 02/07/2018 01/20/2018 11/18/2017 11/02/2017 10/03/2017 10/01/2017 09/98/3382  Systolic BP 505 - - - 397 673 -  Diastolic BP 84 - - - 97 419 -  Wt. (Lbs) 143 130 130 130 - - 130  BMI 27.02 24.56 24.56 24.56 - - 24.56  Some encounter information is confidential and restricted. Go to Review Flowsheets activity to see all data.   Foot/eye exam completion dates Latest Ref Rng & Units  03/16/2017 06/18/2016  Eye Exam No Retinopathy Retinopathy(A) -  Foot exam Order - - -  Foot Form Completion - - Done        Hyperlipidemia LDL goal <70 Controlled, no change in medication Need to verify current medication. Hyperlipidemia:Low fat diet discussed and encouraged.   Lipid Panel  Lab Results  Component Value Date   CHOL 141 02/09/2018   HDL 47 (L) 02/09/2018   LDLCALC 74 02/09/2018   LDLDIRECT 122 (H) 01/08/2010   TRIG 110 02/09/2018   CHOLHDL 3.0 02/09/2018       At high risk for falls Increased fall risk due to lower extremity weakness and peripheral neuropathy refer for physical therapy twice weekly for 6 weeks  Lower extremity weakness Bilateral lower extremity weakness due to prolonged institutionalization will benefit from physical therapy for strngthening

## 2018-02-07 NOTE — Patient Instructions (Addendum)
Wellness with nurse past due, please schedule   F/U with MD in 2 months  Fasting lipid, cmp and EGFR, HBA1C, TSH , CBC , and vit D in am an microa   Please schedule mammogram at checkout, last done in 2015  2 handicap stickers  Temporary will be provided  I will refer you for PT  Woll work on test supplies, and see if you eill need DrNida based on lab rsslt  Congrats on NORMAL blood pressure, MUST REMAIN DRUG FREE

## 2018-02-08 ENCOUNTER — Telehealth: Payer: Self-pay | Admitting: Family Medicine

## 2018-02-08 NOTE — Telephone Encounter (Signed)
Called patient to let her know her disability parking application is signed and ready for pickup.

## 2018-02-09 ENCOUNTER — Other Ambulatory Visit: Payer: Self-pay

## 2018-02-09 DIAGNOSIS — E1152 Type 2 diabetes mellitus with diabetic peripheral angiopathy with gangrene: Secondary | ICD-10-CM | POA: Diagnosis not present

## 2018-02-09 DIAGNOSIS — E11621 Type 2 diabetes mellitus with foot ulcer: Secondary | ICD-10-CM | POA: Diagnosis not present

## 2018-02-09 DIAGNOSIS — L97509 Non-pressure chronic ulcer of other part of unspecified foot with unspecified severity: Secondary | ICD-10-CM | POA: Diagnosis not present

## 2018-02-09 MED ORDER — POTASSIUM CHLORIDE CRYS ER 20 MEQ PO TBCR: EXTENDED_RELEASE_TABLET | ORAL | 2 refills | Status: DC

## 2018-02-09 MED ORDER — EZETIMIBE 10 MG PO TABS: 10.0000 mg | ORAL_TABLET | Freq: Every day | ORAL | 2 refills | Status: DC

## 2018-02-09 MED ORDER — GABAPENTIN 100 MG PO CAPS: 100.0000 mg | ORAL_CAPSULE | Freq: Three times a day (TID) | ORAL | 2 refills | Status: DC

## 2018-02-09 MED ORDER — TRAZODONE HCL 50 MG PO TABS: ORAL_TABLET | ORAL | 2 refills | Status: DC

## 2018-02-09 MED ORDER — ALBUTEROL SULFATE HFA 108 (90 BASE) MCG/ACT IN AERS: INHALATION_SPRAY | RESPIRATORY_TRACT | 2 refills | Status: DC

## 2018-02-09 MED ORDER — MONTELUKAST SODIUM 10 MG PO TABS: ORAL_TABLET | ORAL | 2 refills | Status: DC

## 2018-02-09 MED ORDER — PANTOPRAZOLE SODIUM 40 MG PO TBEC: 40.0000 mg | DELAYED_RELEASE_TABLET | Freq: Every day | ORAL | 1 refills | Status: DC

## 2018-02-09 MED ORDER — FLUOXETINE HCL 20 MG PO CAPS: ORAL_CAPSULE | ORAL | 2 refills | Status: DC

## 2018-02-09 MED ORDER — CLOPIDOGREL BISULFATE 75 MG PO TABS: 75.0000 mg | ORAL_TABLET | Freq: Every day | ORAL | 2 refills | Status: DC

## 2018-02-09 MED ORDER — FUROSEMIDE 20 MG PO TABS: 20.0000 mg | ORAL_TABLET | Freq: Every day | ORAL | 2 refills | Status: DC

## 2018-02-09 MED ORDER — FENOFIBRATE MICRONIZED 200 MG PO CAPS: 200.0000 mg | ORAL_CAPSULE | Freq: Every day | ORAL | 2 refills | Status: DC

## 2018-02-09 NOTE — Telephone Encounter (Signed)
Done

## 2018-02-09 NOTE — Telephone Encounter (Signed)
[facility] faxed over her refills but they didn't start back refilling meds until March and they only dispensed the list of 4 that I put in your box.

## 2018-02-09 NOTE — Telephone Encounter (Signed)
Patient left voicemail 02/08/18 that she needs all prescriptions [facility]. Says her legs are swollen and she needs fluid pill. I called her to find out details but there was no answer, I left a voicemail requesting a call back.

## 2018-02-09 NOTE — Telephone Encounter (Signed)
pls send in 1 month with 3 refills of highlighted meds as we discussed  ?? Please ask

## 2018-02-10 ENCOUNTER — Telehealth (INDEPENDENT_AMBULATORY_CARE_PROVIDER_SITE_OTHER): Payer: Self-pay | Admitting: Orthopedic Surgery

## 2018-02-10 ENCOUNTER — Ambulatory Visit (INDEPENDENT_AMBULATORY_CARE_PROVIDER_SITE_OTHER): Payer: Self-pay | Admitting: Orthopedic Surgery

## 2018-02-10 ENCOUNTER — Other Ambulatory Visit (INDEPENDENT_AMBULATORY_CARE_PROVIDER_SITE_OTHER): Payer: Self-pay | Admitting: Orthopedic Surgery

## 2018-02-10 DIAGNOSIS — I509 Heart failure, unspecified: Secondary | ICD-10-CM | POA: Diagnosis not present

## 2018-02-10 LAB — HEPATIC FUNCTION PANEL
AG Ratio: 1.2 (calc) (ref 1.0–2.5)
ALT: 23 U/L (ref 6–29)
AST: 26 U/L (ref 10–35)
Albumin: 3.5 g/dL — ABNORMAL LOW (ref 3.6–5.1)
Alkaline phosphatase (APISO): 81 U/L (ref 33–130)
Bilirubin, Direct: 0.1 mg/dL (ref 0.0–0.2)
Globulin: 2.9 g/dL (calc) (ref 1.9–3.7)
Indirect Bilirubin: 0.2 mg/dL (calc) (ref 0.2–1.2)
Total Bilirubin: 0.3 mg/dL (ref 0.2–1.2)
Total Protein: 6.4 g/dL (ref 6.1–8.1)

## 2018-02-10 LAB — BASIC METABOLIC PANEL WITH GFR
BUN/Creatinine Ratio: 20 (calc) (ref 6–22)
BUN: 32 mg/dL — ABNORMAL HIGH (ref 7–25)
CO2: 18 mmol/L — ABNORMAL LOW (ref 20–32)
Calcium: 9.7 mg/dL (ref 8.6–10.4)
Chloride: 111 mmol/L — ABNORMAL HIGH (ref 98–110)
Creat: 1.6 mg/dL — ABNORMAL HIGH (ref 0.50–1.05)
GFR, Est African American: 42 mL/min/{1.73_m2} — ABNORMAL LOW (ref >=60)
GFR, Est Non African American: 36 mL/min/{1.73_m2} — ABNORMAL LOW (ref >=60)
Glucose, Bld: 183 mg/dL — ABNORMAL HIGH (ref 65–99)
Potassium: 4.9 mmol/L (ref 3.5–5.3)
Sodium: 144 mmol/L (ref 135–146)

## 2018-02-10 LAB — LIPID PANEL
Cholesterol: 141 mg/dL (ref <200)
HDL: 47 mg/dL — ABNORMAL LOW (ref >50)
LDL Cholesterol (Calc): 74 mg/dL (calc)
Non-HDL Cholesterol (Calc): 94 mg/dL (calc) (ref <130)
Total CHOL/HDL Ratio: 3 (calc) (ref <5.0)
Triglycerides: 110 mg/dL (ref <150)

## 2018-02-10 LAB — HEMOGLOBIN A1C
Hgb A1c MFr Bld: 7.2 % of total Hgb — ABNORMAL HIGH (ref <5.7)
Mean Plasma Glucose: 160 (calc)
eAG (mmol/L): 8.9 (calc)

## 2018-02-10 MED ORDER — OXYCODONE-ACETAMINOPHEN 5-325 MG PO TABS: 1.0000 | ORAL_TABLET | Freq: Three times a day (TID) | ORAL | 0 refills | Status: DC | PRN

## 2018-02-10 NOTE — Telephone Encounter (Signed)
rx written

## 2018-02-10 NOTE — Telephone Encounter (Signed)
Called pt no answer and no vm available. Will hold and try again later.

## 2018-02-10 NOTE — Telephone Encounter (Signed)
Patient had to reschedule appt today due to not having any transportation, she rescheduled for 4/23.   She is requesting a refill on her percocet.

## 2018-02-10 NOTE — Telephone Encounter (Signed)
09/2017 s/p bilateral LE I&D and STSG pt is requesting refill on percocet last refill 01/20/18 #30

## 2018-02-10 NOTE — Telephone Encounter (Signed)
I tried to call pt and no answer and no voicemail will hold and try again.

## 2018-02-11 ENCOUNTER — Encounter: Payer: Self-pay | Admitting: Family Medicine

## 2018-02-15 ENCOUNTER — Encounter: Payer: Self-pay | Admitting: Family Medicine

## 2018-02-15 ENCOUNTER — Other Ambulatory Visit: Payer: Self-pay

## 2018-02-15 ENCOUNTER — Telehealth: Payer: Self-pay

## 2018-02-15 ENCOUNTER — Encounter (INDEPENDENT_AMBULATORY_CARE_PROVIDER_SITE_OTHER): Payer: Self-pay | Admitting: Orthopedic Surgery

## 2018-02-15 ENCOUNTER — Ambulatory Visit (INDEPENDENT_AMBULATORY_CARE_PROVIDER_SITE_OTHER): Payer: Medicare Other | Admitting: Orthopedic Surgery

## 2018-02-15 DIAGNOSIS — Z9181 History of falling: Secondary | ICD-10-CM | POA: Insufficient documentation

## 2018-02-15 DIAGNOSIS — L97919 Non-pressure chronic ulcer of unspecified part of right lower leg with unspecified severity: Secondary | ICD-10-CM

## 2018-02-15 DIAGNOSIS — L97929 Non-pressure chronic ulcer of unspecified part of left lower leg with unspecified severity: Secondary | ICD-10-CM

## 2018-02-15 NOTE — Assessment & Plan Note (Signed)
Controlled, no change in medication Need to verify current medication. Hyperlipidemia:Low fat diet discussed and encouraged.   Lipid Panel  Lab Results  Component Value Date   CHOL 141 02/09/2018   HDL 47 (L) 02/09/2018   LDLCALC 74 02/09/2018   LDLDIRECT 122 (H) 01/08/2010   TRIG 110 02/09/2018   CHOLHDL 3.0 02/09/2018

## 2018-02-15 NOTE — Assessment & Plan Note (Signed)
Controlled, no change in medication, need to verify current medication [name] is reminded of the importance of commitment to daily physical activity for 30 minutes or more, as able and the need to limit carbohydrate intake to 30 to 60 grams per meal to help with blood sugar control.   The need to take medication as prescribed, test blood sugar as directed, and to call between visits if there is a concern that blood sugar is uncontrolled is also discussed.   [name] is reminded of the importance of daily foot exam, annual eye examination, and good blood sugar, blood pressure and cholesterol control.  Diabetic Labs Latest Ref Rng & Units 02/09/2018 09/27/2017 09/25/2017 09/23/2017 09/14/2017  HbA1c <5.7 % of total Hgb 7.2(H) - - - -  Microalbumin Not estab mg/dL - - - - -  Micro/Creat Ratio <30 mcg/mg creat - - - - -  Chol <200 mg/dL 141 - - - -  HDL >50 mg/dL 47(L) - - - -  Calc LDL mg/dL (calc) 74 - - - -  Triglycerides <150 mg/dL 110 - - - -  Creatinine 0.50 - 1.05 mg/dL 1.60(H) 1.58(H) 2.24(H) 2.55(H) 1.45(H)  GFR >60.00 mL/min - - - - -   BP/Weight 02/07/2018 01/20/2018 11/18/2017 11/02/2017 10/03/2017 10/01/2017 76/73/4193  Systolic BP 790 - - - 240 973 -  Diastolic BP 84 - - - 97 532 -  Wt. (Lbs) 143 130 130 130 - - 130  BMI 27.02 24.56 24.56 24.56 - - 24.56  Some encounter information is confidential and restricted. Go to Review Flowsheets activity to see all data.   Foot/eye exam completion dates Latest Ref Rng & Units 03/16/2017 06/18/2016  Eye Exam No Retinopathy Retinopathy(A) -  Foot exam Order - - -  Foot Form Completion - - Done

## 2018-02-15 NOTE — Telephone Encounter (Signed)
Patient states she is needing her novolog refilled [facility]. I'm not sure what directions to send in.

## 2018-02-15 NOTE — Assessment & Plan Note (Signed)
Bilateral lower extremity weakness due to prolonged institutionalization will benefit from physical therapy for strngthening

## 2018-02-15 NOTE — Assessment & Plan Note (Signed)
Controlled, needs to continue current medication, need to verify with pharmacy what she is current;ly taking , she has not brought her meidcations to the visit

## 2018-02-15 NOTE — Assessment & Plan Note (Signed)
Increased fall risk due to lower extremity weakness and peripheral neuropathy refer for physical therapy twice weekly for 6 weeks
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
## 2018-02-15 NOTE — Telephone Encounter (Signed)
Pt in office for appt today and picked up rx.

## 2018-02-16 ENCOUNTER — Ambulatory Visit (INDEPENDENT_AMBULATORY_CARE_PROVIDER_SITE_OTHER): Payer: Medicare Other

## 2018-02-16 ENCOUNTER — Telehealth: Payer: Self-pay | Admitting: Family Medicine

## 2018-02-16 VITALS — BP 136/70 | HR 82 | Temp 97.7°F | Resp 14 | Ht 61.0 in | Wt 149.0 lb

## 2018-02-16 DIAGNOSIS — Z0000 Encounter for general adult medical examination without abnormal findings: Secondary | ICD-10-CM | POA: Diagnosis not present

## 2018-02-16 NOTE — Telephone Encounter (Signed)
Beth from [facility] left a voicemail confiming transportation for patients appt on 02/16/18

## 2018-02-16 NOTE — Progress Notes (Signed)
Subjective:   [name] is a 52 y.o. female who presents for Medicare Annual (Subsequent) preventive examination.  Review of Systems:     Objective:     Vitals: BP 136/70 (BP Location: Left Arm, Patient Position: Sitting, Cuff Size: Normal)   Pulse 82   Temp 97.7 F (36.5 C) (Oral)   Resp 14   Ht _0  (1.549 m)   Wt 149 lb 0.6 oz (67.6 kg)   LMP 05/19/2016   SpO2 97%   BMI 28.16 kg/m   Body mass index is 28.16 kg/m.  Advanced Directives 02/16/2018 10/02/2017 09/27/2017 09/14/2017 08/26/2017 08/16/2017 08/09/2017  Does Patient Have a Medical Advance Directive? No No No No Yes Yes Yes  Type of Advance Directive - - - - (No Data) (No Data) (No Data)  Does patient want to make changes to medical advance directive? - - - - No - Patient declined No - Patient declined No - Patient declined  Would patient like information on creating a medical advance directive? Yes (MAU/Ambulatory/Procedural Areas - Information given) No - Patient declined No - Patient declined - No - Patient declined No - Patient declined No - Patient declined  Pre-existing out of facility DNR order (yellow form or pink MOST form) - - - - - - -  Some encounter information is confidential and restricted. Go to Review Flowsheets activity to see all data.    Tobacco Social History   Tobacco Use  Smoking Status Former Smoker  . Packs/day: 1.00  . Years: 20.00  . Pack years: 20.00  . Types: Cigarettes  . Last attempt to quit: 06/03/2017  . Years since quitting: 0.7  Smokeless Tobacco Never Used     Counseling given: Yes   Clinical Intake:    Past Medical History:  Diagnosis Date  . Alcohol use   . Ankle fracture, lateral malleolus, closed 12/30/2011  . Anxiety   . Breast mass, left 12/15/2011  . Chronic anemia   . Chronic combined systolic and diastolic CHF (congestive heart failure) ([place])   . CKD (chronic kidney disease), stage III ([place])    pt denies  . Cocaine abuse ([place])   . COPD (chronic  obstructive pulmonary disease) ([place])   . Diabetes mellitus, type 2 ([place])   . Diabetic Charcot foot ([place]) 12/30/2011  . Essential hypertension   . History of GI bleed 12/05/2009   Qualifier: Diagnosis of  By: Zeb Comfort    . Hyperlipidemia   . Hypertensive cardiomyopathy ([place]) 11/08/2012   a. 05/2016: echo showing EF of 20-25% with normal cors by cath in 07/2016.  Marland Kitchen Noncompliance with medication regimen   . Obesity   . Panic attacks   . PAT (paroxysmal atrial tachycardia) ([place])    a. started on amiodarone 07/2017 for this.  . Sleep apnea    does not use cpap  . Stroke ([place])    06/24/17  . Tobacco abuse    Past Surgical History:  Procedure Laterality Date  . BREAST BIOPSY    . CARDIAC CATHETERIZATION N/A 07/28/2016   Procedure: Left Heart Cath and Coronary Angiography;  Surgeon: Jettie Booze, MD;  Location: [facility];  Service: Cardiovascular;  Laterality: N/A;  . DILATION AND CURETTAGE OF UTERUS    . I&D EXTREMITY Bilateral 09/22/2017   Procedure: BILATERAL DEBRIDEMENT LEG/FOOT ULCERS, APPLY VERAFLO WOUND VAC;  Surgeon: Newt Minion, MD;  Location: [facility];  Service: Orthopedics;  Laterality: Bilateral;  . SKIN SPLIT GRAFT Bilateral 09/28/2017  Procedure: BILATERAL SPLIT THICKNESS SKIN GRAFT LEGS/FEET AND APPLY VAC;  Surgeon: Newt Minion, MD;  Location: [facility];  Service: Orthopedics;  Laterality: Bilateral;   Family History  Problem Relation Age of Onset  . Hypertension Mother   . Heart attack Mother   . Hypertension Father        CABG   . Hypertension Sister   . Hypertension Brother   . Hypertension Sister   . Cancer Sister        breast   . Arthritis Unknown   . Cancer Unknown   . Diabetes Unknown   . Asthma Unknown   . Hypertension Daughter   . Hypertension Son    Social History   Socioeconomic History  . Marital status: Widowed    Spouse name: Not on file  . Number of children: 2  . Years of education: Not on file  . Highest education  level: Not on file  Occupational History  . Not on file  Social Needs  . Financial resource strain: Not hard at all  . Food insecurity:    Worry: Never true    Inability: Never true  . Transportation needs:    Medical: No    Non-medical: No  Tobacco Use  . Smoking status: Former Smoker    Packs/day: 1.00    Years: 20.00    Pack years: 20.00    Types: Cigarettes    Last attempt to quit: 06/03/2017    Years since quitting: 0.7  . Smokeless tobacco: Never Used  Substance and Sexual Activity  . Alcohol use: No    Alcohol/week: 0.0 oz    Comment: Quit in 2017  . Drug use: Not Currently    Frequency: 3.0 times per week    Types: Marijuana, Cocaine    Comment: none since August 2018  . Sexual activity: Not Currently    Birth control/protection: None  Lifestyle  . Physical activity:    Days per week: 7 days    Minutes per session: 60 min  . Stress: Not at all  Relationships  . Social connections:    Talks on phone: More than three times a week    Gets together: More than three times a week    Attends religious service: More than 4 times per year    Active member of club or organization: Yes    Attends meetings of clubs or organizations: More than 4 times per year    Relationship status: Widowed  Other Topics Concern  . Not on file  Social History Narrative   Now has an apartment and lives with her dog. Is going to church and stopped all the "bad" stuff. Is "living for the [place]". Doing much better. Has a great outlook on life.Is much happier now.     Outpatient Encounter Medications as of 02/16/2018  Medication Sig  . ACCU-CHEK FASTCLIX LANCETS MISC Test up to 4 times daily DX e11.65  . albuterol (PROAIR HFA) 108 (90 Base) MCG/ACT inhaler INHALE 2 PUFFS EVERY 6 HOURS AS NEEDED FOR SHORTNESS OF BREATH/WHEEZING.  Marland Kitchen amiodarone (PACERONE) 200 MG tablet Take 1 tablet (200 mg total) by mouth 2 (two) times daily.  Marland Kitchen aspirin EC 81 MG EC tablet Take 1 tablet (81 mg total) by mouth  daily.  . Azelastine HCl (ASTEPRO) 0.15 % SOLN Place 1 spray into the nose every 12 (twelve) hours as needed (for allergies).   . Blood Glucose Monitoring Suppl (ACCU-CHEK AVIVA PLUS) w/Device KIT Four times daily  testing dx E11.65  . carvedilol (COREG) 25 MG tablet Take 1 tablet (25 mg total) by mouth 2 (two) times daily with a meal.  . clopidogrel (PLAVIX) 75 MG tablet Take 1 tablet (75 mg total) by mouth daily with breakfast.  . Cranberry 400 MG TABS Take 1 tablet by mouth daily.  Marland Kitchen ezetimibe (ZETIA) 10 MG tablet Take 1 tablet (10 mg total) by mouth daily.  . fenofibrate micronized (LOFIBRA) 200 MG capsule Take 1 capsule (200 mg total) by mouth daily before breakfast.  . FLUoxetine (PROZAC) 20 MG capsule TAKE 1 CAPSULE BY MOUTH EVERY DAY.  . furosemide (LASIX) 20 MG tablet Take 1 tablet (20 mg total) by mouth daily.  Marland Kitchen gabapentin (NEURONTIN) 100 MG capsule Take 1 capsule (100 mg total) by mouth 3 (three) times daily.  Marland Kitchen glucose blood (ACCU-CHEK AVIVA) test strip Use as instructed four times daily dx: E11.65  . hydrALAZINE (APRESOLINE) 25 MG tablet Take 25 mg 3 (three) times daily by mouth.  . insulin detemir (LEVEMIR) 100 UNIT/ML injection Inject 0.08 mLs (8 Units total) into the skin at bedtime.  Marland Kitchen ipratropium-albuterol (DUONEB) 0.5-2.5 (3) MG/3ML SOLN Take 3 mLs by nebulization every 6 (six) hours as needed (wheezes or SOB).   . meclizine (ANTIVERT) 25 MG tablet Take 1 tablet (25 mg total) by mouth 3 (three) times daily as needed for dizziness.  . montelukast (SINGULAIR) 10 MG tablet TAKE (1) TABLET BY MOUTH AT BEDTIME.  . Multiple Vitamin (MULTIVITAMIN WITH MINERALS) TABS tablet Take 1 tablet by mouth daily.  Marland Kitchen oxyCODONE-acetaminophen (PERCOCET/ROXICET) 5-325 MG tablet Take 1 tablet by mouth every 8 (eight) hours as needed for severe pain.  Marland Kitchen oxyCODONE-acetaminophen (PERCOCET/ROXICET) 5-325 MG tablet Take 1 tablet by mouth every 8 (eight) hours as needed for severe pain.  . pantoprazole  (PROTONIX) 40 MG tablet Take 1 tablet (40 mg total) by mouth daily.  . potassium chloride SA (K-DUR,KLOR-CON) 20 MEQ tablet TAKE 1&1/2 TABLET BY MOUTH DAILY.  . rosuvastatin (CRESTOR) 40 MG tablet Take 1 tablet (40 mg total) by mouth daily.  . silver sulfADIAZINE (SILVADENE) 1 % cream Apply 1 application topically daily. Apply to affected area daily plus dry dressing  . thiamine 100 MG tablet Take 1 tablet (100 mg total) by mouth daily.  Marland Kitchen tiotropium (SPIRIVA HANDIHALER) 18 MCG inhalation capsule Place 18 mcg into inhaler and inhale daily. 2 puffs  . traZODone (DESYREL) 50 MG tablet TAKE (1) TABLET BY MOUTH AT BEDTIME.  Marland Kitchen Vitamin D, Ergocalciferol, (DRISDOL) 50000 units CAPS capsule Take 1 capsule (50,000 Units total) by mouth once a week.  . silver sulfADIAZINE (SILVADENE) 1 % cream Apply 1 application topically daily. (Patient not taking: Reported on 02/16/2018)   No facility-administered encounter medications on file as of 02/16/2018.     Activities of Daily Living In your present state of health, do you have any difficulty performing the following activities: 02/16/2018 09/27/2017  Hearing? N -  Vision? N -  Difficulty concentrating or making decisions? N -  Walking or climbing stairs? Y -  Dressing or bathing? Y -  Doing errands, shopping? N Y  Conservation officer, nature and eating ? Y -  Using the Toilet? N -  In the past six months, have you accidently leaked urine? Y -  Do you have problems with loss of bowel control? N -  Managing your Medications? N -  Managing your Finances? N -  Housekeeping or managing your Housekeeping? Y -  Some recent data might be  hidden    Patient Care Team: [name], MD as PCP - General Madelin Headings, DO (Optometry) Garwin Brothers, MD as Physician Assistant (Internal Medicine)    Assessment:   This is a routine wellness examination for [place].  Exercise Activities and Dietary recommendations Current Exercise Habits: Home exercise routine, Type of  exercise: Other - see comments, Time (Minutes): 60, Frequency (Times/Week): 7, Weekly Exercise (Minutes/Week): 420, Intensity: Mild, Exercise limited by: orthopedic condition(s)  Goals    . Exercise 3x per week (30 min per time)     Recommend exercising 3 times a week for at least 30 minutes at a time.        Fall Risk Fall Risk  02/16/2018 02/16/2018 08/31/2017 07/14/2017 12/15/2016  Falls in the past year? No No No Yes No  Number falls in past yr: - - - 1 -  Injury with Fall? - - - No -  Risk for fall due to : - - Impaired balance/gait;Impaired mobility Impaired mobility -  Follow up - - - Falls prevention discussed -   Is the patient's home free of loose throw rugs in walkways, pet beds, electrical cords, etc?   yes      Grab bars in the bathroom? yes      Handrails on the stairs?   no (single level home)      Adequate lighting?   yes  Depression Screen PHQ 2/9 Scores 02/16/2018 02/16/2018 02/07/2018 07/14/2017  PHQ - 2 Score 0 0 3 0  PHQ- 9 Score _0 -     Cognitive Function     6CIT Screen 02/16/2018 12/15/2016  What Year? 0 points 0 points  What month? 0 points 0 points  What time? 0 points 0 points  Count back from 20 0 points 0 points  Months in reverse 0 points 0 points  Repeat phrase 0 points 0 points  Total Score 0 0    Immunization History  Administered Date(s) Administered  . H1N1 11/07/2008  . Influenza Split 07/12/2012  . Influenza Whole 07/03/2007, 07/13/2007, 07/24/2008, 09/26/2009, 07/17/2010  . Influenza,inj,Quad PF,6+ Mos 08/28/2013, 09/10/2014, 09/03/2015, 08/31/2016  . Pneumococcal Polysaccharide-23 08/25/2006, 11/12/2012  . Td 09/08/2005  . Tdap 03/15/2012    Screening Tests Health Maintenance  Topic Date Due  . MAMMOGRAM  07/31/2016  . COLONOSCOPY  08/23/2016  . URINE MICROALBUMIN  09/02/2016  . FOOT EXAM  06/18/2017  . OPHTHALMOLOGY EXAM  03/16/2018  . INFLUENZA VACCINE  05/26/2018  . HEMOGLOBIN A1C  08/11/2018  . PAP SMEAR  05/08/2019  .  TETANUS/TDAP  03/15/2022  . PNEUMOCOCCAL POLYSACCHARIDE VACCINE  Completed  . Hepatitis C Screening  Completed  . HIV Screening  Completed    Cancer Screenings: Lung: Low Dose CT Chest recommended if Age 57-80 years, 30 pack-year currently smoking OR have quit w/in 15years. Patient does not qualify. Breast:  Up to date on Mammogram? No  Patient is scheduled to have mammogram 02/17/18 at 11:00am at [facility]:   I have personally reviewed and noted the following in the patient's chart:   . Medical and social history . Use of alcohol, tobacco or illicit drugs  . Current medications and supplements . Functional ability and status . Nutritional status . Physical activity . Advanced directives . List of other physicians . Hospitalizations, surgeries, and ER visits in previous 12 months . Vitals . Screenings to include cognitive, depression, and falls . Referrals and appointments  In addition,  I have reviewed and discussed with patient certain preventive protocols, quality metrics, and best practice recommendations. A written personalized care plan for preventive services as well as general preventive health recommendations were provided to patient.     Tod Persia, [place]  02/16/2018

## 2018-02-16 NOTE — Telephone Encounter (Signed)
She states she uses usually 3 units (per sliding scale) If you think she needs to discontinue this just let her know

## 2018-02-16 NOTE — Patient Instructions (Signed)
[name] , Thank you for taking time to come for your Medicare Wellness Visit. I appreciate your ongoing commitment to your health goals. Please review the following plan we discussed and let me know if I can assist you in the future.   Screening recommendations/referrals: Colonoscopy: Please schedule Mammogram: 02/17/18 at 11:00am [name] Recommended yearly ophthalmology/optometry visit for glaucoma screening and checkup: Eye exam due in May 2019 Recommended yearly dental visit for hygiene and checkup  Vaccinations: Influenza vaccine: Due August 2019 Pneumococcal vaccine: UTD Tdap vaccine:Due 02/2022  Advanced directives:We will you information about this  Conditions/risks identified: I will let Brandi and Dr.Simpson know about this issues we discussed during your visit.   Next appointment: April 11, 2018 at 2:20pm with Dr.Simpson  Preventive Care 40-64 Years, Female Preventive care refers to lifestyle choices and visits with your health care provider that can promote health and wellness. What does preventive care include?  A yearly physical exam. This is also called an annual well check.  Dental exams once or twice a year.  Routine eye exams. Ask your health care provider how often you should have your eyes checked.  Personal lifestyle choices, including:  Daily care of your teeth and gums.  Regular physical activity.  Eating a healthy diet.  Avoiding tobacco and drug use.  Limiting alcohol use.  Practicing safe sex.  Taking low-dose aspirin daily starting at age 64.  Taking vitamin and mineral supplements as recommended by your health care provider. What happens during an annual well check? The services and screenings done by your health care provider during your annual well check will depend on your age, overall health, lifestyle risk factors, and family history of disease. Counseling  Your health care provider may ask you questions about your:  Alcohol  use.  Tobacco use.  Drug use.  Emotional well-being.  Home and relationship well-being.  Sexual activity.  Eating habits.  Work and work Statistician.  Method of birth control.  Menstrual cycle.  Pregnancy history. Screening  You may have the following tests or measurements:  Height, weight, and BMI.  Blood pressure.  Lipid and cholesterol levels. These may be checked every 5 years, or more frequently if you are over 34 years old.  Skin check.  Lung cancer screening. You may have this screening every year starting at age 84 if you have a 30-pack-year history of smoking and currently smoke or have quit within the past 15 years.  Fecal occult blood test (FOBT) of the stool. You may have this test every year starting at age 21.  Flexible sigmoidoscopy or colonoscopy. You may have a sigmoidoscopy every 5 years or a colonoscopy every 10 years starting at age 73.  Hepatitis C blood test.  Hepatitis B blood test.  Sexually transmitted disease (STD) testing.  Diabetes screening. This is done by checking your blood sugar (glucose) after you have not eaten for a while (fasting). You may have this done every 1-3 years.  Mammogram. This may be done every 1-2 years. Talk to your health care provider about when you should start having regular mammograms. This may depend on whether you have a family history of breast cancer.  BRCA-related cancer screening. This may be done if you have a family history of breast, ovarian, tubal, or peritoneal cancers.  Pelvic exam and Pap test. This may be done every 3 years starting at age 47. Starting at age 1, this may be done every 5 years if you have a Pap test  in combination with an HPV test.  Bone density scan. This is done to screen for osteoporosis. You may have this scan if you are at high risk for osteoporosis. Discuss your test results, treatment options, and if necessary, the need for more tests with your health care  provider. Vaccines  Your health care provider may recommend certain vaccines, such as:  Influenza vaccine. This is recommended every year.  Tetanus, diphtheria, and acellular pertussis (Tdap, Td) vaccine. You may need a Td booster every 10 years.  Zoster vaccine. You may need this after age 60.  Pneumococcal 13-valent conjugate (PCV13) vaccine. You may need this if you have certain conditions and were not previously vaccinated.  Pneumococcal polysaccharide (PPSV23) vaccine. You may need one or two doses if you smoke cigarettes or if you have certain conditions. Talk to your health care provider about which screenings and vaccines you need and how often you need them. This information is not intended to replace advice given to you by your health care provider. Make sure you discuss any questions you have with your health care provider. Document Released: 11/08/2015 Document Revised: 07/01/2016 Document Reviewed: 08/13/2015 Elsevier Interactive Patient Education  2017 Elsevier Inc.    Fall Prevention in the Home Falls can cause injuries. They can happen to people of all ages. There are many things you can do to make your home safe and to help prevent falls. What can I do on the outside of my home?  Regularly fix the edges of walkways and driveways and fix any cracks.  Remove anything that might make you trip as you walk through a door, such as a raised step or threshold.  Trim any bushes or trees on the path to your home.  Use bright outdoor lighting.  Clear any walking paths of anything that might make someone trip, such as rocks or tools.  Regularly check to see if handrails are loose or broken. Make sure that both sides of any steps have handrails.  Any raised decks and porches should have guardrails on the edges.  Have any leaves, snow, or ice cleared regularly.  Use sand or salt on walking paths during winter.  Clean up any spills in your garage right away. This includes  oil or grease spills. What can I do in the bathroom?  Use night lights.  Install grab bars by the toilet and in the tub and shower. Do not use towel bars as grab bars.  Use non-skid mats or decals in the tub or shower.  If you need to sit down in the shower, use a plastic, non-slip stool.  Keep the floor dry. Clean up any water that spills on the floor as soon as it happens.  Remove soap buildup in the tub or shower regularly.  Attach bath mats securely with double-sided non-slip rug tape.  Do not have throw rugs and other things on the floor that can make you trip. What can I do in the bedroom?  Use night lights.  Make sure that you have a light by your bed that is easy to reach.  Do not use any sheets or blankets that are too big for your bed. They should not hang down onto the floor.  Have a firm chair that has side arms. You can use this for support while you get dressed.  Do not have throw rugs and other things on the floor that can make you trip. What can I do in the kitchen?  Clean up any   spills right away.  Avoid walking on wet floors.  Keep items that you use a lot in easy-to-reach places.  If you need to reach something above you, use a strong step stool that has a grab bar.  Keep electrical cords out of the way.  Do not use floor polish or wax that makes floors slippery. If you must use wax, use non-skid floor wax.  Do not have throw rugs and other things on the floor that can make you trip. What can I do with my stairs?  Do not leave any items on the stairs.  Make sure that there are handrails on both sides of the stairs and use them. Fix handrails that are broken or loose. Make sure that handrails are as long as the stairways.  Check any carpeting to make sure that it is firmly attached to the stairs. Fix any carpet that is loose or worn.  Avoid having throw rugs at the top or bottom of the stairs. If you do have throw rugs, attach them to the floor  with carpet tape.  Make sure that you have a light switch at the top of the stairs and the bottom of the stairs. If you do not have them, ask someone to add them for you. What else can I do to help prevent falls?  Wear shoes that:  Do not have high heels.  Have rubber bottoms.  Are comfortable and fit you well.  Are closed at the toe. Do not wear sandals.  If you use a stepladder:  Make sure that it is fully opened. Do not climb a closed stepladder.  Make sure that both sides of the stepladder are locked into place.  Ask someone to hold it for you, if possible.  Clearly mark and make sure that you can see:  Any grab bars or handrails.  First and last steps.  Where the edge of each step is.  Use tools that help you move around (mobility aids) if they are needed. These include:  Canes.  Walkers.  Scooters.  Crutches.  Turn on the lights when you go into a dark area. Replace any light bulbs as soon as they burn out.  Set up your furniture so you have a clear path. Avoid moving your furniture around.  If any of your floors are uneven, fix them.  If there are any pets around you, be aware of where they are.  Review your medicines with your doctor. Some medicines can make you feel dizzy. This can increase your chance of falling. Ask your doctor what other things that you can do to help prevent falls. This information is not intended to replace advice given to you by your health care provider. Make sure you discuss any questions you have with your health care provider. Document Released: 08/08/2009 Document Revised: 03/19/2016 Document Reviewed: 11/16/2014 Elsevier Interactive Patient Education  2017 Elsevier Inc.  

## 2018-02-17 ENCOUNTER — Telehealth: Payer: Self-pay | Admitting: Family Medicine

## 2018-02-17 ENCOUNTER — Encounter: Payer: Self-pay | Admitting: Family Medicine

## 2018-02-17 ENCOUNTER — Other Ambulatory Visit: Payer: Self-pay | Admitting: Family Medicine

## 2018-02-17 ENCOUNTER — Ambulatory Visit (HOSPITAL_COMMUNITY)
Admission: RE | Admit: 2018-02-17 | Discharge: 2018-02-17 | Disposition: A | Discharge status: Home / Self Care | Payer: Medicare Other | Source: Ambulatory Visit | Attending: Family Medicine | Admitting: Family Medicine

## 2018-02-17 ENCOUNTER — Other Ambulatory Visit: Payer: Self-pay

## 2018-02-17 DIAGNOSIS — Z1231 Encounter for screening mammogram for malignant neoplasm of breast: Secondary | ICD-10-CM | POA: Diagnosis not present

## 2018-02-17 MED ORDER — UNABLE TO FIND: 11 refills | Status: DC

## 2018-02-17 MED ORDER — UNABLE TO FIND: 0 refills | Status: DC

## 2018-02-17 MED ORDER — ROSUVASTATIN CALCIUM 10 MG PO TABS: 10.0000 mg | ORAL_TABLET | Freq: Every day | ORAL | 3 refills | Status: DC

## 2018-02-17 MED ORDER — LINAGLIPTIN 5 MG PO TABS: 5.0000 mg | ORAL_TABLET | Freq: Every day | ORAL | 5 refills | Status: DC

## 2018-02-17 NOTE — Telephone Encounter (Signed)
Patient left message on nurse line stating that she is returning [place] and Dr. Griffin Dakin call regarding incontinence supplies

## 2018-02-17 NOTE — Telephone Encounter (Signed)
Iam not putting her on short acting insulin, I have resumed tradjenta and sent to [facility]. When she is to get the nextserty of medication , I would like to start her on crestor 10 mg one daily and stop the fenofibrate 200 mg can we let them know that , she just got 1 month sent in Also she says she needs incontinence supplies wetting on herself and also a shower chair , she has medicaid Also she is asking for help getting the cAP aid, I do not know there is anything we can do, if we have already filled out and signed the form it is for her family to follow through, if we have not , can we please fil out the form

## 2018-02-17 NOTE — Telephone Encounter (Signed)
done

## 2018-02-24 DIAGNOSIS — J449 Chronic obstructive pulmonary disease, unspecified: Secondary | ICD-10-CM | POA: Diagnosis not present

## 2018-02-25 ENCOUNTER — Telehealth (INDEPENDENT_AMBULATORY_CARE_PROVIDER_SITE_OTHER): Payer: Self-pay | Admitting: Orthopedic Surgery

## 2018-02-25 NOTE — Telephone Encounter (Signed)
Patient called needing Rx refilled (Oxycodone) The number to contact patient is [phone]

## 2018-02-25 NOTE — Telephone Encounter (Signed)
Pt is calling for refill on her percocet. Last refill 02/10/18 qty # 30. Will hold message for Dr. Sharol Given Monday.

## 2018-02-27 DIAGNOSIS — I509 Heart failure, unspecified: Secondary | ICD-10-CM | POA: Diagnosis not present

## 2018-02-27 DIAGNOSIS — I10 Essential (primary) hypertension: Secondary | ICD-10-CM | POA: Diagnosis not present

## 2018-02-27 DIAGNOSIS — E785 Hyperlipidemia, unspecified: Secondary | ICD-10-CM | POA: Diagnosis not present

## 2018-02-28 ENCOUNTER — Other Ambulatory Visit (INDEPENDENT_AMBULATORY_CARE_PROVIDER_SITE_OTHER): Payer: Self-pay | Admitting: Orthopedic Surgery

## 2018-02-28 MED ORDER — OXYCODONE-ACETAMINOPHEN 5-325 MG PO TABS: 1.0000 | ORAL_TABLET | Freq: Three times a day (TID) | ORAL | 0 refills | Status: DC | PRN

## 2018-02-28 NOTE — Telephone Encounter (Signed)
Called pt no answer and voicemail not available.

## 2018-02-28 NOTE — Telephone Encounter (Signed)
rx written

## 2018-02-28 NOTE — Telephone Encounter (Signed)
Tried to call pt no answer

## 2018-02-28 NOTE — Telephone Encounter (Signed)
I tried to reach pt and non answer voicemail not set up yet. I will try pt again later.

## 2018-02-28 NOTE — Telephone Encounter (Signed)
See blow and advise.

## 2018-02-28 NOTE — Telephone Encounter (Signed)
I called pt and no answer no vm set up will hold and try again later.

## 2018-03-01 ENCOUNTER — Other Ambulatory Visit: Payer: Self-pay

## 2018-03-01 NOTE — Patient Outreach (Signed)
[facility]) Care Management  03/01/2018  [name] 19-Jan-1966 [id number]   Medication Adherence call to [name] patient is showing past due under [facility] Ins.on Rosuvastatin 40 mg spoke with patient she is no longer taking this medication she said she is now taking Tricor instead.  [place] Management Direct Dial [phone]  Fax [phone] [email or web address]

## 2018-03-01 NOTE — Telephone Encounter (Signed)
This has been taken care of.

## 2018-03-01 NOTE — Telephone Encounter (Signed)
I have tried to reach pt numerous times without success. Message states voicemail not set up yet. Signing off on message. rx is logged in at the front desk.

## 2018-03-12 DIAGNOSIS — I509 Heart failure, unspecified: Secondary | ICD-10-CM | POA: Diagnosis not present

## 2018-03-15 ENCOUNTER — Telehealth (INDEPENDENT_AMBULATORY_CARE_PROVIDER_SITE_OTHER): Payer: Self-pay | Admitting: Orthopedic Surgery

## 2018-03-15 ENCOUNTER — Other Ambulatory Visit (INDEPENDENT_AMBULATORY_CARE_PROVIDER_SITE_OTHER): Payer: Self-pay | Admitting: Orthopedic Surgery

## 2018-03-15 MED ORDER — OXYCODONE-ACETAMINOPHEN 5-325 MG PO TABS: 1.0000 | ORAL_TABLET | Freq: Three times a day (TID) | ORAL | 0 refills | Status: DC | PRN

## 2018-03-15 NOTE — Telephone Encounter (Signed)
Patient called requesting RX for refill for Oxycodone.  Please call patient to advise.

## 2018-03-15 NOTE — Telephone Encounter (Signed)
rx written

## 2018-03-15 NOTE — Telephone Encounter (Signed)
Called pt to advise that rx is available for pick up. Voicemail box has not bee set up yet called x 3 will hold message and try again later.

## 2018-03-16 NOTE — Telephone Encounter (Signed)
I have tried to reach the pt multiple time without success. There is no voicemail available. The rx has been logged at the desk and is available for pick up should the pt call back she can be advised.

## 2018-03-22 ENCOUNTER — Ambulatory Visit (INDEPENDENT_AMBULATORY_CARE_PROVIDER_SITE_OTHER): Payer: Self-pay | Admitting: Orthopedic Surgery

## 2018-03-25 ENCOUNTER — Other Ambulatory Visit: Payer: Self-pay | Admitting: Family Medicine

## 2018-03-25 DIAGNOSIS — E119 Type 2 diabetes mellitus without complications: Principal | ICD-10-CM

## 2018-03-25 DIAGNOSIS — L608 Other nail disorders: Secondary | ICD-10-CM

## 2018-03-25 DIAGNOSIS — IMO0001 Reserved for inherently not codable concepts without codable children: Secondary | ICD-10-CM

## 2018-03-25 DIAGNOSIS — E11610 Type 2 diabetes mellitus with diabetic neuropathic arthropathy: Secondary | ICD-10-CM

## 2018-03-25 DIAGNOSIS — Z794 Long term (current) use of insulin: Principal | ICD-10-CM

## 2018-03-25 NOTE — Progress Notes (Signed)
amb pod  

## 2018-03-27 DIAGNOSIS — J449 Chronic obstructive pulmonary disease, unspecified: Secondary | ICD-10-CM | POA: Diagnosis not present

## 2018-03-30 DIAGNOSIS — I10 Essential (primary) hypertension: Secondary | ICD-10-CM | POA: Diagnosis not present

## 2018-03-30 DIAGNOSIS — I509 Heart failure, unspecified: Secondary | ICD-10-CM | POA: Diagnosis not present

## 2018-03-30 DIAGNOSIS — E785 Hyperlipidemia, unspecified: Secondary | ICD-10-CM | POA: Diagnosis not present

## 2018-04-01 ENCOUNTER — Other Ambulatory Visit: Payer: Self-pay | Admitting: Family Medicine

## 2018-04-04 ENCOUNTER — Ambulatory Visit (INDEPENDENT_AMBULATORY_CARE_PROVIDER_SITE_OTHER): Payer: Medicare Other | Admitting: Orthopedic Surgery

## 2018-04-04 ENCOUNTER — Encounter (INDEPENDENT_AMBULATORY_CARE_PROVIDER_SITE_OTHER): Payer: Self-pay | Admitting: Orthopedic Surgery

## 2018-04-04 DIAGNOSIS — L97929 Non-pressure chronic ulcer of unspecified part of left lower leg with unspecified severity: Secondary | ICD-10-CM | POA: Diagnosis not present

## 2018-04-04 DIAGNOSIS — L97919 Non-pressure chronic ulcer of unspecified part of right lower leg with unspecified severity: Secondary | ICD-10-CM

## 2018-04-04 MED ORDER — OXYCODONE-ACETAMINOPHEN 5-325 MG PO TABS: 1.0000 | ORAL_TABLET | Freq: Three times a day (TID) | ORAL | 0 refills | Status: DC | PRN
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
## 2018-04-11 ENCOUNTER — Ambulatory Visit: Payer: Self-pay | Admitting: Family Medicine

## 2018-04-12 DIAGNOSIS — I509 Heart failure, unspecified: Secondary | ICD-10-CM | POA: Diagnosis not present

## 2018-04-13 ENCOUNTER — Telehealth (INDEPENDENT_AMBULATORY_CARE_PROVIDER_SITE_OTHER): Payer: Self-pay | Admitting: Orthopedic Surgery

## 2018-04-13 NOTE — Telephone Encounter (Signed)
Med Refill  Oxycodone- acetaminophen(Percocet/Roxicet)5-325 mg tablet

## 2018-04-14 NOTE — Telephone Encounter (Signed)
I called pt and advised that we can not give rx for percocet she received one on 04/04/18 for #20 and also on the same day from another doctor for tramadol #30. Too soon to fill. Advised to elevate higher than heart while at rest, use compression socks and to walk.

## 2018-04-22 IMAGING — DX DG HUMERUS 2V *L*
2 series · 2 of 2 positions shown · non-contrast
Comparison: Chest x-ray April 03, 2015

CLINICAL DATA: Patient heard a popping sound and her left arm and
woke up with bruising and pain to the left upper arm.

EXAM:
LEFT HUMERUS - 2+ VIEW

[humerus ap]
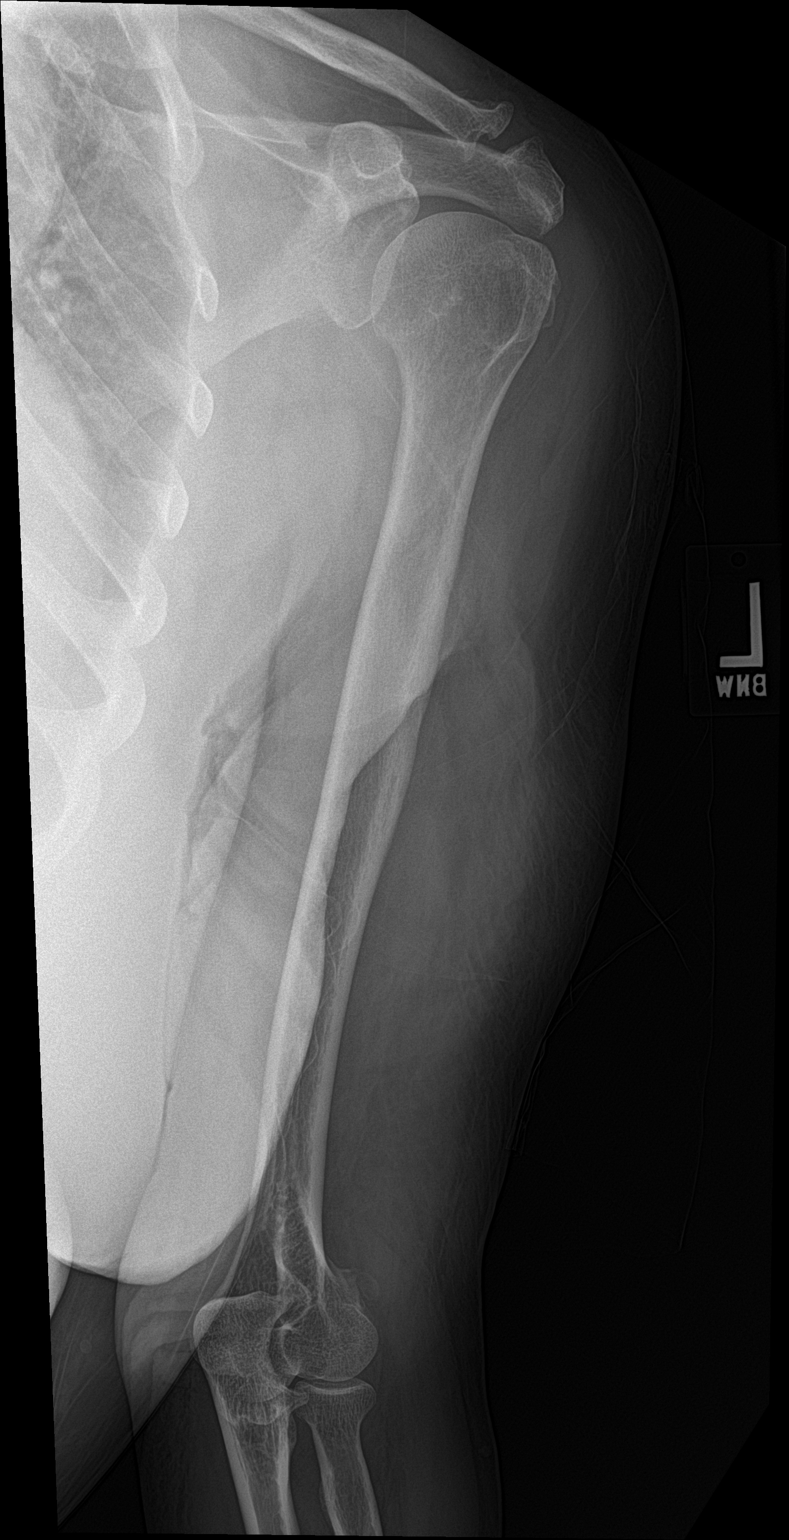

[humerus lat]
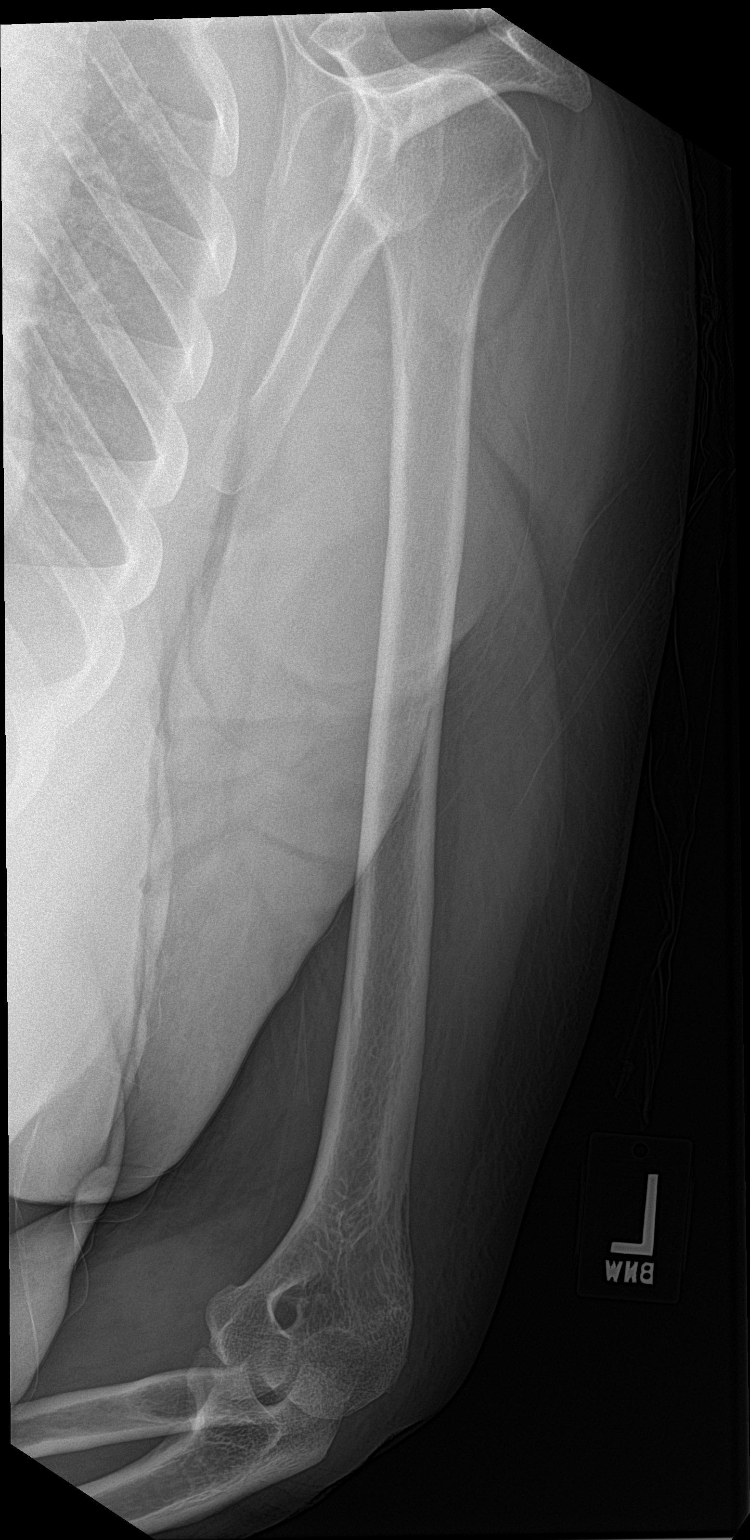

[2 of 2 positions shown; findings below may reference images not displayed]

FINDINGS: There is no evidence of fracture or dislocation. Widening of the
acromial clavicular joint space is unchanged compared to prior chest
x-ray April 03, 2015. Soft tissues are unremarkable.
IMPRESSION: No acute fracture or dislocation.

## 2018-04-26 DIAGNOSIS — J449 Chronic obstructive pulmonary disease, unspecified: Secondary | ICD-10-CM | POA: Diagnosis not present

## 2018-04-29 DIAGNOSIS — I509 Heart failure, unspecified: Secondary | ICD-10-CM | POA: Diagnosis not present

## 2018-04-29 DIAGNOSIS — E785 Hyperlipidemia, unspecified: Secondary | ICD-10-CM | POA: Diagnosis not present

## 2018-04-29 DIAGNOSIS — I10 Essential (primary) hypertension: Secondary | ICD-10-CM | POA: Diagnosis not present

## 2018-05-02 ENCOUNTER — Ambulatory Visit (INDEPENDENT_AMBULATORY_CARE_PROVIDER_SITE_OTHER): Payer: Medicare Other | Admitting: Orthopedic Surgery

## 2018-05-02 ENCOUNTER — Other Ambulatory Visit: Payer: Self-pay | Admitting: Family Medicine

## 2018-05-02 ENCOUNTER — Encounter (INDEPENDENT_AMBULATORY_CARE_PROVIDER_SITE_OTHER): Payer: Self-pay | Admitting: Orthopedic Surgery

## 2018-05-02 VITALS — Ht 61.0 in | Wt 149.0 lb

## 2018-05-02 DIAGNOSIS — B351 Tinea unguium: Secondary | ICD-10-CM

## 2018-05-02 DIAGNOSIS — L97919 Non-pressure chronic ulcer of unspecified part of right lower leg with unspecified severity: Secondary | ICD-10-CM | POA: Diagnosis not present

## 2018-05-02 DIAGNOSIS — L97929 Non-pressure chronic ulcer of unspecified part of left lower leg with unspecified severity: Secondary | ICD-10-CM
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
## 2018-05-03 ENCOUNTER — Telehealth: Payer: Self-pay | Admitting: Family Medicine

## 2018-05-03 NOTE — Telephone Encounter (Signed)
Do you want to refill this? Looks like [name] printed it yesterday but I never received it back to fax.

## 2018-05-03 NOTE — Telephone Encounter (Signed)
Anderson Malta from [facility] cb# [phone] Tramadol 50mg  was denied but they never received new rx to follow. She is requesting a call back to discuss.

## 2018-05-04 NOTE — Telephone Encounter (Signed)
Has appt in am needs to keepm appt

## 2018-05-05 ENCOUNTER — Ambulatory Visit (INDEPENDENT_AMBULATORY_CARE_PROVIDER_SITE_OTHER): Payer: Medicare Other | Admitting: Family Medicine

## 2018-05-05 VITALS — BP 178/98 | HR 106 | Resp 16 | Ht 61.0 in | Wt 164.0 lb

## 2018-05-05 DIAGNOSIS — L97509 Non-pressure chronic ulcer of other part of unspecified foot with unspecified severity: Secondary | ICD-10-CM

## 2018-05-05 DIAGNOSIS — E1165 Type 2 diabetes mellitus with hyperglycemia: Secondary | ICD-10-CM

## 2018-05-05 DIAGNOSIS — E1349 Other specified diabetes mellitus with other diabetic neurological complication: Secondary | ICD-10-CM | POA: Diagnosis not present

## 2018-05-05 DIAGNOSIS — E1152 Type 2 diabetes mellitus with diabetic peripheral angiopathy with gangrene: Secondary | ICD-10-CM

## 2018-05-05 DIAGNOSIS — E785 Hyperlipidemia, unspecified: Secondary | ICD-10-CM | POA: Diagnosis not present

## 2018-05-05 DIAGNOSIS — E11621 Type 2 diabetes mellitus with foot ulcer: Secondary | ICD-10-CM

## 2018-05-05 DIAGNOSIS — E1129 Type 2 diabetes mellitus with other diabetic kidney complication: Secondary | ICD-10-CM

## 2018-05-05 DIAGNOSIS — M48061 Spinal stenosis, lumbar region without neurogenic claudication: Secondary | ICD-10-CM

## 2018-05-05 DIAGNOSIS — I10 Essential (primary) hypertension: Secondary | ICD-10-CM

## 2018-05-05 DIAGNOSIS — M5441 Lumbago with sciatica, right side: Secondary | ICD-10-CM

## 2018-05-05 DIAGNOSIS — G8929 Other chronic pain: Secondary | ICD-10-CM

## 2018-05-05 DIAGNOSIS — M5442 Lumbago with sciatica, left side: Secondary | ICD-10-CM

## 2018-05-05 DIAGNOSIS — IMO0002 Reserved for concepts with insufficient information to code with codable children: Secondary | ICD-10-CM

## 2018-05-05 MED ORDER — TRAMADOL HCL 50 MG PO TABS: ORAL_TABLET | ORAL | 4 refills | Status: DC

## 2018-05-05 MED ORDER — AMLODIPINE BESYLATE 5 MG PO TABS: 5.0000 mg | ORAL_TABLET | Freq: Every day | ORAL | 3 refills | Status: DC

## 2018-05-05 MED ORDER — OLMESARTAN MEDOXOMIL 40 MG PO TABS: 40.0000 mg | ORAL_TABLET | Freq: Every day | ORAL | 3 refills | Status: DC

## 2018-05-05 NOTE — Assessment & Plan Note (Signed)
Chronic back pain with disc disease rated at a 9 , confirmed with an MRI in 2018, refer to Dr Merlene Laughter for painmanagement

## 2018-05-05 NOTE — Patient Instructions (Signed)
Annual physical exam in 2 months to 10 weeks , call if you need me before  Two new medications for blood pressure  I have started medication at bedtime for your pain and I will be in touch with Dr Sharol Given and Dr Merlene Laughter also about pain management  You are referred to Dr Dr Merlene Laughter re chronic back pain, Once he accepts you you need only ONE prescriber for pain   Microalb, hBA1C , chem 7 and EGFr and Fasting lipid 1 week before follow up

## 2018-05-05 NOTE — Progress Notes (Signed)
[name]     MRN: [id number]      DOB: Mar 24, 1966   HPI [name] is here for follow up and re-evaluation of chronic medical conditions, medication management and review of any available recent lab and radiology data.  Her main complaint is of uncontrolled generalized pain in feet, band back, requesting improved pain management.  On record review, she has recently been prescribed both tramadol and hydrocodone from Dr Sharol Given who has been managing her feet for months Denies any drug or alcohol use, now lives on her own, blood pressure is elevated at this visit. Denies polyuria, polydipsia, blurred vision , or hypoglycemic episodes.    ROS Denies recent fever or chills. Denies sinus pressure, nasal congestion, ear pain or sore throat. Denies chest congestion, productive cough or wheezing. Denies chest pains, palpitations and leg swelling Denies abdominal pain, nausea, vomiting,diarrhea or constipation.   Denies dysuria, frequency, hesitancy c/o incontinence.  Denies headaches, seizures,c/o  numbness, and  tingling. Denies depression, anxiety or insomnia. Chronic ulcers of feet improving PE  BP (!) 178/98   Pulse (!) 106   Resp 16   Ht 5\' 1"  (1.549 m)   Wt 164 lb (74.4 kg)   LMP 05/19/2016   SpO2 92%   BMI 30.99 kg/m   Patient alert and oriented and in no cardiopulmonary distress.  HEENT: No facial asymmetry, EOMI,   oropharynx pink and moist.  Neck supple no JVD, no mass.  Chest: Clear to auscultation bilaterally.  CVS: S1, S2 no murmurs, no S3.Regular rate.  ABD: Soft non tender.   Ext: No edema  MS: decreased  ROM spine, shoulders, hips and knees.  Skin: healing ulcers on both lower extremities  Psych: Good eye contact, normal affect. Memory intact not anxious or depressed appearing.  CNS: CN 2-12 intact, power,  normal throughout.no focal deficits noted.   Assessment & Plan  Diabetes mellitus with foot ulcer and gangrene ([place]) Chronic bilateral foot  pain, with charcot disease continue bedtime tramadol asbefore  Backache Chronic back pain with disc disease rated at a 9 , confirmed with an MRI in 2018, refer to Dr Merlene Laughter for painmanagement  HTN (hypertension), malignant Uncontrolled medication adjustments made and the importance of compliance is stressed DASH diet and commitment to daily physical activity for a minimum of 30 minutes discussed and encouraged, as a part of hypertension management. The importance of attaining a healthy weight is also discussed.  BP/Weight 05/05/2018 05/02/2018 02/16/2018 02/07/2018 01/20/2018 6/37/8588 5/0/2774  Systolic BP 128 - 786 767 - - -  Diastolic BP 98 - 70 84 - - -  Wt. (Lbs) 164 149 149.04 143 130 130 130  BMI 30.99 28.15 28.16 27.02 24.56 24.56 24.56  Some encounter information is confidential and restricted. Go to Review Flowsheets activity to see all data.        Diabetic neuropathy Treated with gabapentin  Spinal stenosis of lumbar region Reports uncontrolled chronic back pain with established arthritis and disc disease in the spine Chronic pain management needs to be though a Pain clinic , has h/o alcohol and drug dependence and also has diabetic neuropathic pain, she is referred  Once established with a pain center , I will no longer prescribe tramadol      DM (diabetes mellitus), type 2, uncontrolled, with renal complications [facility]) [name] is reminded of the importance of commitment to daily physical activity for 30 minutes or more, as able and the need to limit carbohydrate intake to 30  to 60 grams per meal to help with blood sugar control.   The need to take medication as prescribed, test blood sugar as directed, and to call between visits if there is a concern that blood sugar is uncontrolled is also discussed.   [name] is reminded of the importance of daily foot exam, annual eye examination, and good blood sugar, blood pressure and cholesterol control. Updated lab  needed at/ before next visit.   Diabetic Labs Latest Ref Rng & Units 02/09/2018 09/27/2017 09/25/2017 09/23/2017 09/14/2017  HbA1c <5.7 % of total Hgb 7.2(H) - - - -  Microalbumin Not estab mg/dL - - - - -  Micro/Creat Ratio <30 mcg/mg creat - - - - -  Chol <200 mg/dL 141 - - - -  HDL >50 mg/dL 47(L) - - - -  Calc LDL mg/dL (calc) 74 - - - -  Triglycerides <150 mg/dL 110 - - - -  Creatinine 0.50 - 1.05 mg/dL 1.60(H) 1.58(H) 2.24(H) 2.55(H) 1.45(H)  GFR >60.00 mL/min - - - - -   BP/Weight 05/05/2018 05/02/2018 02/16/2018 02/07/2018 01/20/2018 12/02/2180 06/02/3373  Systolic BP 451 - 460 479 - - -  Diastolic BP 98 - 70 84 - - -  Wt. (Lbs) 164 149 149.04 143 130 130 130  BMI 30.99 28.15 28.16 27.02 24.56 24.56 24.56  Some encounter information is confidential and restricted. Go to Review Flowsheets activity to see all data.   Foot/eye exam completion dates Latest Ref Rng & Units 03/16/2017 06/18/2016  Eye Exam No Retinopathy Retinopathy(A) -  Foot exam Order - - -  Foot Form Completion - - Done

## 2018-05-05 NOTE — Assessment & Plan Note (Signed)
Chronic bilateral foot pain, with charcot disease continue bedtime tramadol asbefore

## 2018-05-11 ENCOUNTER — Telehealth: Payer: Self-pay | Admitting: Family Medicine

## 2018-05-11 NOTE — Telephone Encounter (Signed)
Faxed [facility] to [facility]xl appt for Pt on 05-12-18 due to Emergency at the office

## 2018-05-12 ENCOUNTER — Ambulatory Visit: Payer: Self-pay

## 2018-05-12 DIAGNOSIS — I509 Heart failure, unspecified: Secondary | ICD-10-CM | POA: Diagnosis not present

## 2018-05-15 ENCOUNTER — Encounter: Payer: Self-pay | Admitting: Family Medicine

## 2018-05-15 NOTE — Assessment & Plan Note (Signed)
Uncontrolled medication adjustments made and the importance of compliance is stressed DASH diet and commitment to daily physical activity for a minimum of 30 minutes discussed and encouraged, as a part of hypertension management. The importance of attaining a healthy weight is also discussed.  BP/Weight 05/05/2018 05/02/2018 02/16/2018 02/07/2018 01/20/2018 11/24/2907 0/12/147  Systolic BP 969 - 249 324 - - -  Diastolic BP 98 - 70 84 - - -  Wt. (Lbs) 164 149 149.04 143 130 130 130  BMI 30.99 28.15 28.16 27.02 24.56 24.56 24.56  Some encounter information is confidential and restricted. Go to Review Flowsheets activity to see all data.

## 2018-05-15 NOTE — Assessment & Plan Note (Signed)
[name] is reminded of the importance of commitment to daily physical activity for 30 minutes or more, as able and the need to limit carbohydrate intake to 30 to 60 grams per meal to help with blood sugar control.   The need to take medication as prescribed, test blood sugar as directed, and to call between visits if there is a concern that blood sugar is uncontrolled is also discussed.   [name] is reminded of the importance of daily foot exam, annual eye examination, and good blood sugar, blood pressure and cholesterol control. Updated lab needed at/ before next visit.   Diabetic Labs Latest Ref Rng & Units 02/09/2018 09/27/2017 09/25/2017 09/23/2017 09/14/2017  HbA1c <5.7 % of total Hgb 7.2(H) - - - -  Microalbumin Not estab mg/dL - - - - -  Micro/Creat Ratio <30 mcg/mg creat - - - - -  Chol <200 mg/dL 141 - - - -  HDL >50 mg/dL 47(L) - - - -  Calc LDL mg/dL (calc) 74 - - - -  Triglycerides <150 mg/dL 110 - - - -  Creatinine 0.50 - 1.05 mg/dL 1.60(H) 1.58(H) 2.24(H) 2.55(H) 1.45(H)  GFR >60.00 mL/min - - - - -   BP/Weight 05/05/2018 05/02/2018 02/16/2018 02/07/2018 01/20/2018 2/95/6213 0/05/6577  Systolic BP 469 - 629 528 - - -  Diastolic BP 98 - 70 84 - - -  Wt. (Lbs) 164 149 149.04 143 130 130 130  BMI 30.99 28.15 28.16 27.02 24.56 24.56 24.56  Some encounter information is confidential and restricted. Go to Review Flowsheets activity to see all data.   Foot/eye exam completion dates Latest Ref Rng & Units 03/16/2017 06/18/2016  Eye Exam No Retinopathy Retinopathy(A) -  Foot exam Order - - -  Foot Form Completion - - Done

## 2018-05-15 NOTE — Assessment & Plan Note (Signed)
Reports uncontrolled chronic back pain with established arthritis and disc disease in the spine Chronic pain management needs to be though a Pain clinic , has h/o alcohol and drug dependence and also has diabetic neuropathic pain, she is referred  Once established with a pain center , I will no longer prescribe tramadol

## 2018-05-15 NOTE — Assessment & Plan Note (Signed)
Treated with gabapentin

## 2018-05-16 ENCOUNTER — Telehealth: Payer: Self-pay | Admitting: Family Medicine

## 2018-05-16 NOTE — Telephone Encounter (Signed)
Patient called in to let Dr.Simpson know that she things her blood pressure medication is to strong. She states she can barely use her walker. Cb#: [phone]

## 2018-05-17 NOTE — Telephone Encounter (Signed)
She will need to have her blood pressure re evaluated in the office before that can be determined , pls arrange MD blood pressure visit within the next 1 week

## 2018-05-26 ENCOUNTER — Ambulatory Visit (INDEPENDENT_AMBULATORY_CARE_PROVIDER_SITE_OTHER): Payer: Medicare Other | Admitting: Family Medicine

## 2018-05-26 ENCOUNTER — Encounter: Payer: Self-pay | Admitting: Family Medicine

## 2018-05-26 ENCOUNTER — Other Ambulatory Visit: Payer: Self-pay

## 2018-05-26 VITALS — BP 110/80 | HR 100 | Resp 12 | Ht 61.0 in | Wt 152.1 lb

## 2018-05-26 DIAGNOSIS — I10 Essential (primary) hypertension: Secondary | ICD-10-CM | POA: Diagnosis not present

## 2018-05-26 DIAGNOSIS — Z1211 Encounter for screening for malignant neoplasm of colon: Secondary | ICD-10-CM

## 2018-05-26 MED ORDER — AMLODIPINE BESYLATE 2.5 MG PO TABS: 2.5000 mg | ORAL_TABLET | Freq: Every day | ORAL | 5 refills | Status: DC

## 2018-05-26 NOTE — Patient Instructions (Signed)
F/u as as before   You are referred for colonoscopy   New lower dose of amlodipine top 2.5 mg one daily instead of 5 mg daily when you next get your medication

## 2018-05-26 NOTE — Progress Notes (Signed)
   [name]     MRN: [id number]      DOB: 1966-06-06   HPI [name] is here for follow up of blood pressure. She c/o feeling light headed on current medication dose, so is here for assesment ROS Denies recent fever or chills. Denies sinus pressure, nasal congestion, ear pain or sore throat. Denies chest congestion, productive cough or wheezing. C/o chronic  joint pain, swelling and limitation in mobility.  Denies uncontrolled depression, anxiety or insomnia. Denies skin break down or rash.   PE  BP 110/80   Pulse 100   Resp 12   Ht 5\' 1"  (1.549 m)   Wt 152 lb 1.9 oz (69 kg)   LMP 05/19/2016   SpO2 100%   BMI 28.74 kg/m   Patient alert and oriented and in no cardiopulmonary distress.  HEENT: No facial asymmetry, EOMI,   oropharynx pink and moist.  Neck supple no JVD, no mass.  Chest: Clear to auscultation bilaterally.  CVS: S1, S2 no murmurs, no S3.Regular rate.  ABD: Soft non tender.   Ext: No edema  MS: decreased  ROM spine, shoulders, hips and knees.  Skin: Intact, no ulcerations or rash noted.     Assessment & Plan  HTN (hypertension), malignant Over corrected reduyce amlodipine to 2.5 mg dose  DASH diet and commitment to daily physical activity for a minimum of 30 minutes discussed and encouraged, as a part of hypertension management. The importance of attaining a healthy weight is also discussed.  BP/Weight 05/26/2018 05/05/2018 05/02/2018 02/16/2018 02/07/2018 01/20/2018 12/18/3610  Systolic BP 244 975 - 300 511 - -  Diastolic BP 80 98 - 70 84 - -  Wt. (Lbs) 152.12 164 149 149.04 143 130 130  BMI 28.74 30.99 28.15 28.16 27.02 24.56 24.56  Some encounter information is confidential and restricted. Go to Review Flowsheets activity to see all data.

## 2018-05-26 NOTE — Assessment & Plan Note (Signed)
Over corrected reduyce amlodipine to 2.5 mg dose  DASH diet and commitment to daily physical activity for a minimum of 30 minutes discussed and encouraged, as a part of hypertension management. The importance of attaining a healthy weight is also discussed.  BP/Weight 05/26/2018 05/05/2018 05/02/2018 02/16/2018 02/07/2018 01/20/2018 6/78/9381  Systolic BP 017 510 - 258 527 - -  Diastolic BP 80 98 - 70 84 - -  Wt. (Lbs) 152.12 164 149 149.04 143 130 130  BMI 28.74 30.99 28.15 28.16 27.02 24.56 24.56  Some encounter information is confidential and restricted. Go to Review Flowsheets activity to see all data.

## 2018-05-27 ENCOUNTER — Other Ambulatory Visit: Payer: Self-pay | Admitting: Family Medicine

## 2018-05-27 DIAGNOSIS — J449 Chronic obstructive pulmonary disease, unspecified: Secondary | ICD-10-CM | POA: Diagnosis not present

## 2018-05-30 ENCOUNTER — Encounter (INDEPENDENT_AMBULATORY_CARE_PROVIDER_SITE_OTHER): Payer: Self-pay | Admitting: Orthopedic Surgery

## 2018-05-30 ENCOUNTER — Ambulatory Visit (INDEPENDENT_AMBULATORY_CARE_PROVIDER_SITE_OTHER): Payer: Medicare Other | Admitting: Orthopedic Surgery

## 2018-05-30 ENCOUNTER — Other Ambulatory Visit: Payer: Self-pay | Admitting: Family Medicine

## 2018-05-30 DIAGNOSIS — E785 Hyperlipidemia, unspecified: Secondary | ICD-10-CM | POA: Diagnosis not present

## 2018-05-30 DIAGNOSIS — L97919 Non-pressure chronic ulcer of unspecified part of right lower leg with unspecified severity: Secondary | ICD-10-CM

## 2018-05-30 DIAGNOSIS — L97929 Non-pressure chronic ulcer of unspecified part of left lower leg with unspecified severity: Secondary | ICD-10-CM

## 2018-05-30 DIAGNOSIS — I509 Heart failure, unspecified: Secondary | ICD-10-CM | POA: Diagnosis not present

## 2018-05-30 DIAGNOSIS — I10 Essential (primary) hypertension: Secondary | ICD-10-CM | POA: Diagnosis not present

## 2018-05-30 MED ORDER — GABAPENTIN 300 MG PO CAPS: 300.0000 mg | ORAL_CAPSULE | Freq: Three times a day (TID) | ORAL | 3 refills | Status: DC
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
## 2018-06-02 ENCOUNTER — Encounter: Payer: Self-pay | Admitting: Gastroenterology

## 2018-06-03 ENCOUNTER — Telehealth: Payer: Self-pay | Admitting: Family Medicine

## 2018-06-03 NOTE — Telephone Encounter (Signed)
Do you remember what was done with this? We were trying to see who we could fax it to before I was out unexpectedly

## 2018-06-03 NOTE — Telephone Encounter (Signed)
Patient calling to check the status of her homehealth referral due to her stroke. Cb#: [phone]/ [phone]

## 2018-06-04 ENCOUNTER — Other Ambulatory Visit: Payer: Self-pay

## 2018-06-04 ENCOUNTER — Encounter (HOSPITAL_COMMUNITY): Payer: Self-pay | Admitting: Emergency Medicine

## 2018-06-04 ENCOUNTER — Emergency Department (HOSPITAL_COMMUNITY): Payer: Medicare Other

## 2018-06-04 ENCOUNTER — Inpatient Hospital Stay (HOSPITAL_COMMUNITY)
Admission: EM | Admit: 2018-06-04 | Discharge: 2018-06-20 | DRG: 871 | Disposition: A | Discharge status: Skilled Nursing Facility | Payer: Medicare Other | Attending: Internal Medicine | Admitting: Internal Medicine

## 2018-06-04 DIAGNOSIS — A419 Sepsis, unspecified organism: Secondary | ICD-10-CM | POA: Diagnosis present

## 2018-06-04 DIAGNOSIS — E860 Dehydration: Secondary | ICD-10-CM | POA: Diagnosis not present

## 2018-06-04 DIAGNOSIS — Z79891 Long term (current) use of opiate analgesic: Secondary | ICD-10-CM

## 2018-06-04 DIAGNOSIS — R6889 Other general symptoms and signs: Secondary | ICD-10-CM | POA: Diagnosis not present

## 2018-06-04 DIAGNOSIS — I480 Paroxysmal atrial fibrillation: Secondary | ICD-10-CM | POA: Diagnosis present

## 2018-06-04 DIAGNOSIS — F1410 Cocaine abuse, uncomplicated: Secondary | ICD-10-CM | POA: Diagnosis present

## 2018-06-04 DIAGNOSIS — J449 Chronic obstructive pulmonary disease, unspecified: Secondary | ICD-10-CM | POA: Diagnosis not present

## 2018-06-04 DIAGNOSIS — N183 Chronic kidney disease, stage 3 unspecified: Secondary | ICD-10-CM | POA: Diagnosis present

## 2018-06-04 DIAGNOSIS — R188 Other ascites: Secondary | ICD-10-CM | POA: Diagnosis present

## 2018-06-04 DIAGNOSIS — E876 Hypokalemia: Secondary | ICD-10-CM | POA: Diagnosis present

## 2018-06-04 DIAGNOSIS — E11610 Type 2 diabetes mellitus with diabetic neuropathic arthropathy: Secondary | ICD-10-CM | POA: Diagnosis not present

## 2018-06-04 DIAGNOSIS — Z8249 Family history of ischemic heart disease and other diseases of the circulatory system: Secondary | ICD-10-CM

## 2018-06-04 DIAGNOSIS — I481 Persistent atrial fibrillation: Secondary | ICD-10-CM | POA: Diagnosis not present

## 2018-06-04 DIAGNOSIS — E785 Hyperlipidemia, unspecified: Secondary | ICD-10-CM | POA: Diagnosis present

## 2018-06-04 DIAGNOSIS — N3001 Acute cystitis with hematuria: Secondary | ICD-10-CM | POA: Diagnosis present

## 2018-06-04 DIAGNOSIS — L8992 Pressure ulcer of unspecified site, stage 2: Secondary | ICD-10-CM | POA: Diagnosis present

## 2018-06-04 DIAGNOSIS — F1910 Other psychoactive substance abuse, uncomplicated: Secondary | ICD-10-CM | POA: Diagnosis present

## 2018-06-04 DIAGNOSIS — J440 Chronic obstructive pulmonary disease with acute lower respiratory infection: Secondary | ICD-10-CM | POA: Diagnosis not present

## 2018-06-04 DIAGNOSIS — D72828 Other elevated white blood cell count: Secondary | ICD-10-CM | POA: Diagnosis not present

## 2018-06-04 DIAGNOSIS — Z8673 Personal history of transient ischemic attack (TIA), and cerebral infarction without residual deficits: Secondary | ICD-10-CM

## 2018-06-04 DIAGNOSIS — I4891 Unspecified atrial fibrillation: Secondary | ICD-10-CM | POA: Diagnosis not present

## 2018-06-04 DIAGNOSIS — B9620 Unspecified Escherichia coli [E. coli] as the cause of diseases classified elsewhere: Secondary | ICD-10-CM | POA: Diagnosis not present

## 2018-06-04 DIAGNOSIS — J189 Pneumonia, unspecified organism: Secondary | ICD-10-CM

## 2018-06-04 DIAGNOSIS — G894 Chronic pain syndrome: Secondary | ICD-10-CM | POA: Diagnosis not present

## 2018-06-04 DIAGNOSIS — M6281 Muscle weakness (generalized): Secondary | ICD-10-CM | POA: Diagnosis not present

## 2018-06-04 DIAGNOSIS — W1830XA Fall on same level, unspecified, initial encounter: Secondary | ICD-10-CM | POA: Diagnosis not present

## 2018-06-04 DIAGNOSIS — R0602 Shortness of breath: Secondary | ICD-10-CM | POA: Diagnosis not present

## 2018-06-04 DIAGNOSIS — R198 Other specified symptoms and signs involving the digestive system and abdomen: Secondary | ICD-10-CM

## 2018-06-04 DIAGNOSIS — R310 Gross hematuria: Secondary | ICD-10-CM | POA: Diagnosis not present

## 2018-06-04 DIAGNOSIS — E1159 Type 2 diabetes mellitus with other circulatory complications: Secondary | ICD-10-CM | POA: Diagnosis present

## 2018-06-04 DIAGNOSIS — R300 Dysuria: Secondary | ICD-10-CM | POA: Diagnosis present

## 2018-06-04 DIAGNOSIS — E875 Hyperkalemia: Secondary | ICD-10-CM | POA: Diagnosis not present

## 2018-06-04 DIAGNOSIS — A4189 Other specified sepsis: Secondary | ICD-10-CM | POA: Diagnosis not present

## 2018-06-04 DIAGNOSIS — A4151 Sepsis due to Escherichia coli [E. coli]: Principal | ICD-10-CM | POA: Diagnosis present

## 2018-06-04 DIAGNOSIS — J181 Lobar pneumonia, unspecified organism: Secondary | ICD-10-CM | POA: Diagnosis present

## 2018-06-04 DIAGNOSIS — K651 Peritoneal abscess: Secondary | ICD-10-CM

## 2018-06-04 DIAGNOSIS — F1010 Alcohol abuse, uncomplicated: Secondary | ICD-10-CM | POA: Diagnosis present

## 2018-06-04 DIAGNOSIS — E1165 Type 2 diabetes mellitus with hyperglycemia: Secondary | ICD-10-CM | POA: Diagnosis not present

## 2018-06-04 DIAGNOSIS — Z87891 Personal history of nicotine dependence: Secondary | ICD-10-CM

## 2018-06-04 DIAGNOSIS — N189 Chronic kidney disease, unspecified: Secondary | ICD-10-CM

## 2018-06-04 DIAGNOSIS — Z7982 Long term (current) use of aspirin: Secondary | ICD-10-CM

## 2018-06-04 DIAGNOSIS — N179 Acute kidney failure, unspecified: Secondary | ICD-10-CM | POA: Diagnosis not present

## 2018-06-04 DIAGNOSIS — I509 Heart failure, unspecified: Secondary | ICD-10-CM | POA: Diagnosis not present

## 2018-06-04 DIAGNOSIS — D62 Acute posthemorrhagic anemia: Secondary | ICD-10-CM | POA: Diagnosis not present

## 2018-06-04 DIAGNOSIS — N3000 Acute cystitis without hematuria: Secondary | ICD-10-CM

## 2018-06-04 DIAGNOSIS — E1129 Type 2 diabetes mellitus with other diabetic kidney complication: Secondary | ICD-10-CM | POA: Diagnosis not present

## 2018-06-04 DIAGNOSIS — Y92238 Other place in hospital as the place of occurrence of the external cause: Secondary | ICD-10-CM | POA: Diagnosis not present

## 2018-06-04 DIAGNOSIS — I10 Essential (primary) hypertension: Secondary | ICD-10-CM | POA: Diagnosis not present

## 2018-06-04 DIAGNOSIS — I5042 Chronic combined systolic (congestive) and diastolic (congestive) heart failure: Secondary | ICD-10-CM | POA: Diagnosis not present

## 2018-06-04 DIAGNOSIS — Z9114 Patient's other noncompliance with medication regimen: Secondary | ICD-10-CM

## 2018-06-04 DIAGNOSIS — Z9119 Patient's noncompliance with other medical treatment and regimen: Secondary | ICD-10-CM

## 2018-06-04 DIAGNOSIS — I130 Hypertensive heart and chronic kidney disease with heart failure and stage 1 through stage 4 chronic kidney disease, or unspecified chronic kidney disease: Secondary | ICD-10-CM | POA: Diagnosis not present

## 2018-06-04 DIAGNOSIS — N390 Urinary tract infection, site not specified: Secondary | ICD-10-CM

## 2018-06-04 DIAGNOSIS — I428 Other cardiomyopathies: Secondary | ICD-10-CM | POA: Diagnosis not present

## 2018-06-04 DIAGNOSIS — I43 Cardiomyopathy in diseases classified elsewhere: Secondary | ICD-10-CM | POA: Diagnosis present

## 2018-06-04 DIAGNOSIS — K750 Abscess of liver: Secondary | ICD-10-CM | POA: Diagnosis not present

## 2018-06-04 DIAGNOSIS — L89152 Pressure ulcer of sacral region, stage 2: Secondary | ICD-10-CM | POA: Diagnosis not present

## 2018-06-04 DIAGNOSIS — R109 Unspecified abdominal pain: Secondary | ICD-10-CM | POA: Diagnosis not present

## 2018-06-04 DIAGNOSIS — I482 Chronic atrial fibrillation: Secondary | ICD-10-CM | POA: Diagnosis not present

## 2018-06-04 DIAGNOSIS — Z6831 Body mass index (BMI) 31.0-31.9, adult: Secondary | ICD-10-CM

## 2018-06-04 DIAGNOSIS — N739 Female pelvic inflammatory disease, unspecified: Secondary | ICD-10-CM

## 2018-06-04 DIAGNOSIS — E118 Type 2 diabetes mellitus with unspecified complications: Secondary | ICD-10-CM | POA: Diagnosis not present

## 2018-06-04 DIAGNOSIS — I471 Supraventricular tachycardia: Secondary | ICD-10-CM | POA: Diagnosis present

## 2018-06-04 DIAGNOSIS — Z91199 Patient's noncompliance with other medical treatment and regimen due to unspecified reason: Secondary | ICD-10-CM

## 2018-06-04 DIAGNOSIS — N170 Acute kidney failure with tubular necrosis: Secondary | ICD-10-CM | POA: Diagnosis not present

## 2018-06-04 DIAGNOSIS — E872 Acidosis: Secondary | ICD-10-CM | POA: Diagnosis not present

## 2018-06-04 DIAGNOSIS — Z7902 Long term (current) use of antithrombotics/antiplatelets: Secondary | ICD-10-CM

## 2018-06-04 DIAGNOSIS — F410 Panic disorder [episodic paroxysmal anxiety] without agoraphobia: Secondary | ICD-10-CM | POA: Diagnosis present

## 2018-06-04 DIAGNOSIS — N2589 Other disorders resulting from impaired renal tubular function: Secondary | ICD-10-CM | POA: Diagnosis not present

## 2018-06-04 DIAGNOSIS — J9601 Acute respiratory failure with hypoxia: Secondary | ICD-10-CM | POA: Diagnosis present

## 2018-06-04 DIAGNOSIS — E1122 Type 2 diabetes mellitus with diabetic chronic kidney disease: Secondary | ICD-10-CM | POA: Diagnosis not present

## 2018-06-04 DIAGNOSIS — M549 Dorsalgia, unspecified: Secondary | ICD-10-CM | POA: Diagnosis present

## 2018-06-04 DIAGNOSIS — L8990 Pressure ulcer of unspecified site, unspecified stage: Secondary | ICD-10-CM

## 2018-06-04 DIAGNOSIS — K5900 Constipation, unspecified: Secondary | ICD-10-CM | POA: Diagnosis present

## 2018-06-04 DIAGNOSIS — R51 Headache: Secondary | ICD-10-CM | POA: Diagnosis not present

## 2018-06-04 DIAGNOSIS — D72829 Elevated white blood cell count, unspecified: Secondary | ICD-10-CM

## 2018-06-04 DIAGNOSIS — G4730 Sleep apnea, unspecified: Secondary | ICD-10-CM | POA: Diagnosis present

## 2018-06-04 DIAGNOSIS — N1832 Chronic kidney disease, stage 3b: Secondary | ICD-10-CM | POA: Diagnosis present

## 2018-06-04 DIAGNOSIS — R262 Difficulty in walking, not elsewhere classified: Secondary | ICD-10-CM | POA: Diagnosis not present

## 2018-06-04 DIAGNOSIS — E669 Obesity, unspecified: Secondary | ICD-10-CM | POA: Diagnosis present

## 2018-06-04 DIAGNOSIS — Z79899 Other long term (current) drug therapy: Secondary | ICD-10-CM

## 2018-06-04 DIAGNOSIS — Z794 Long term (current) use of insulin: Secondary | ICD-10-CM

## 2018-06-04 DIAGNOSIS — D631 Anemia in chronic kidney disease: Secondary | ICD-10-CM | POA: Diagnosis present

## 2018-06-04 HISTORY — DX: Urinary tract infection, site not specified: N39.0

## 2018-06-04 HISTORY — DX: Acute kidney failure, unspecified: N17.9

## 2018-06-04 LAB — BASIC METABOLIC PANEL
Anion gap: 15 (ref 5–15)
BUN: 100 mg/dL — ABNORMAL HIGH (ref 6–20)
CO2: 18 mmol/L — ABNORMAL LOW (ref 22–32)
Calcium: 8.6 mg/dL — ABNORMAL LOW (ref 8.9–10.3)
Chloride: 104 mmol/L (ref 98–111)
Creatinine, Ser: 6.32 mg/dL — ABNORMAL HIGH (ref 0.44–1.00)
GFR calc Af Amer: 8 mL/min — ABNORMAL LOW (ref >60)
GFR calc non Af Amer: 7 mL/min — ABNORMAL LOW (ref >60)
Glucose, Bld: 164 mg/dL — ABNORMAL HIGH (ref 70–99)
Potassium: 6.5 mmol/L (ref 3.5–5.1)
Sodium: 137 mmol/L (ref 135–145)

## 2018-06-04 LAB — APTT: aPTT: 36 seconds (ref 24–36)

## 2018-06-04 LAB — RAPID URINE DRUG SCREEN, HOSP PERFORMED
Amphetamines: NOT DETECTED
Barbiturates: NOT DETECTED
Benzodiazepines: NOT DETECTED
Cocaine: NOT DETECTED
Opiates: NOT DETECTED
Tetrahydrocannabinol: NOT DETECTED

## 2018-06-04 LAB — CBC WITH DIFFERENTIAL/PLATELET
Basophils Absolute: 0 10*3/uL (ref 0.0–0.1)
Basophils Relative: 0 %
Eosinophils Absolute: 0 10*3/uL (ref 0.0–0.7)
Eosinophils Relative: 0 %
HCT: 35.3 % — ABNORMAL LOW (ref 36.0–46.0)
Hemoglobin: 11.4 g/dL — ABNORMAL LOW (ref 12.0–15.0)
Lymphocytes Relative: 6 %
Lymphs Abs: 0.6 10*3/uL — ABNORMAL LOW (ref 0.7–4.0)
MCH: 28.8 pg (ref 26.0–34.0)
MCHC: 32.3 g/dL (ref 30.0–36.0)
MCV: 89.1 fL (ref 78.0–100.0)
Monocytes Absolute: 0.2 10*3/uL (ref 0.1–1.0)
Monocytes Relative: 2 %
Neutro Abs: 9.4 10*3/uL — ABNORMAL HIGH (ref 1.7–7.7)
Neutrophils Relative %: 92 %
Platelets: 358 10*3/uL (ref 150–400)
RBC: 3.96 MIL/uL (ref 3.87–5.11)
RDW: 16 % — ABNORMAL HIGH (ref 11.5–15.5)
WBC: 10.2 10*3/uL (ref 4.0–10.5)

## 2018-06-04 LAB — I-STAT CG4 LACTIC ACID, ED: Lactic Acid, Venous: 2.44 mmol/L (ref 0.5–1.9)

## 2018-06-04 LAB — URINALYSIS, ROUTINE W REFLEX MICROSCOPIC
Bacteria, UA: NONE SEEN
Bilirubin Urine: NEGATIVE
Glucose, UA: NEGATIVE mg/dL
Ketones, ur: NEGATIVE mg/dL
Nitrite: NEGATIVE
Protein, ur: 100 mg/dL — AB
Specific Gravity, Urine: 1.014 (ref 1.005–1.030)
WBC, UA: >50 WBC/hpf — ABNORMAL HIGH (ref 0–5)
pH: 5 (ref 5.0–8.0)

## 2018-06-04 LAB — COMPREHENSIVE METABOLIC PANEL
ALT: 10 U/L (ref 0–44)
AST: 14 U/L — ABNORMAL LOW (ref 15–41)
Albumin: 2.4 g/dL — ABNORMAL LOW (ref 3.5–5.0)
Alkaline Phosphatase: 87 U/L (ref 38–126)
Anion gap: 18 — ABNORMAL HIGH (ref 5–15)
BUN: 106 mg/dL — ABNORMAL HIGH (ref 6–20)
CO2: 16 mmol/L — ABNORMAL LOW (ref 22–32)
Calcium: 9.2 mg/dL (ref 8.9–10.3)
Chloride: 101 mmol/L (ref 98–111)
Creatinine, Ser: 6.85 mg/dL — ABNORMAL HIGH (ref 0.44–1.00)
GFR calc Af Amer: 7 mL/min — ABNORMAL LOW (ref >60)
GFR calc non Af Amer: 6 mL/min — ABNORMAL LOW (ref >60)
Glucose, Bld: 175 mg/dL — ABNORMAL HIGH (ref 70–99)
Potassium: 6.6 mmol/L (ref 3.5–5.1)
Sodium: 135 mmol/L (ref 135–145)
Total Bilirubin: 1 mg/dL (ref 0.3–1.2)
Total Protein: 8.1 g/dL (ref 6.5–8.1)

## 2018-06-04 LAB — STREP PNEUMONIAE URINARY ANTIGEN: Strep Pneumo Urinary Antigen: NEGATIVE

## 2018-06-04 LAB — PROCALCITONIN: Procalcitonin: 58.72 ng/mL

## 2018-06-04 LAB — LIPASE, BLOOD: Lipase: 17 U/L (ref 11–51)

## 2018-06-04 LAB — MRSA PCR SCREENING: MRSA by PCR: NEGATIVE

## 2018-06-04 LAB — PROTIME-INR
INR: 1.41
Prothrombin Time: 17.2 seconds — ABNORMAL HIGH (ref 11.4–15.2)

## 2018-06-04 LAB — LACTIC ACID, PLASMA
Lactic Acid, Venous: 1.9 mmol/L (ref 0.5–1.9)
Lactic Acid, Venous: 1.7 mmol/L (ref 0.5–1.9)

## 2018-06-04 LAB — ETHANOL: Alcohol, Ethyl (B): <10 mg/dL (ref <10)

## 2018-06-04 LAB — TSH: TSH: 0.694 u[IU]/mL (ref 0.350–4.500)

## 2018-06-04 LAB — TROPONIN I: Troponin I: <0.03 ng/mL (ref <0.03)

## 2018-06-04 MED ORDER — THIAMINE HCL 100 MG/ML IJ SOLN
Freq: Once | INTRAVENOUS | Status: AC
  Administered 2018-06-04: 23:00:00 via INTRAVENOUS
  Filled 2018-06-04: qty 1000

## 2018-06-04 MED ORDER — SODIUM CHLORIDE 0.9 % IV SOLN
500.0000 mg | Freq: Once | INTRAVENOUS | Status: AC
  Administered 2018-06-04: 500 mg via INTRAVENOUS
  Filled 2018-06-04: qty 500

## 2018-06-04 MED ORDER — TIOTROPIUM BROMIDE MONOHYDRATE 18 MCG IN CAPS
18.0000 ug | ORAL_CAPSULE | Freq: Every day | RESPIRATORY_TRACT | Status: DC
  Administered 2018-06-05 – 2018-06-20 (×16): 18 ug via RESPIRATORY_TRACT
  Filled 2018-06-04 (×5): qty 5

## 2018-06-04 MED ORDER — SODIUM CHLORIDE 0.9 % IV SOLN
2.0000 g | INTRAVENOUS | Status: DC
  Administered 2018-06-04 – 2018-06-06 (×3): 2 g via INTRAVENOUS
  Filled 2018-06-04 (×3): qty 20
  Filled 2018-06-04: qty 2

## 2018-06-04 MED ORDER — SODIUM CHLORIDE 0.9 % IV BOLUS
1000.0000 mL | Freq: Once | INTRAVENOUS | Status: AC
  Administered 2018-06-04: 1000 mL via INTRAVENOUS

## 2018-06-04 MED ORDER — AMIODARONE HCL 200 MG PO TABS
200.0000 mg | ORAL_TABLET | Freq: Two times a day (BID) | ORAL | Status: DC
  Administered 2018-06-04 – 2018-06-06 (×4): 200 mg via ORAL
  Filled 2018-06-04 (×4): qty 1

## 2018-06-04 MED ORDER — FENTANYL CITRATE (PF) 100 MCG/2ML IJ SOLN
50.0000 ug | Freq: Once | INTRAMUSCULAR | Status: AC
  Administered 2018-06-04: 50 ug via INTRAVENOUS
  Filled 2018-06-04: qty 2

## 2018-06-04 MED ORDER — SODIUM CHLORIDE 0.9 % IV BOLUS (SEPSIS): 250.0000 mL | Freq: Once | INTRAVENOUS | Status: DC

## 2018-06-04 MED ORDER — VITAMIN B-1 100 MG PO TABS
100.0000 mg | ORAL_TABLET | Freq: Every day | ORAL | Status: DC
  Administered 2018-06-05 – 2018-06-20 (×16): 100 mg via ORAL
  Filled 2018-06-04 (×17): qty 1

## 2018-06-04 MED ORDER — TRAMADOL HCL 50 MG PO TABS
50.0000 mg | ORAL_TABLET | Freq: Four times a day (QID) | ORAL | Status: DC | PRN
  Administered 2018-06-04 – 2018-06-19 (×22): 50 mg via ORAL
  Filled 2018-06-04 (×22): qty 1

## 2018-06-04 MED ORDER — CLOPIDOGREL BISULFATE 75 MG PO TABS
75.0000 mg | ORAL_TABLET | Freq: Every day | ORAL | Status: DC
  Administered 2018-06-05 – 2018-06-06 (×2): 75 mg via ORAL
  Filled 2018-06-04: qty 1

## 2018-06-04 MED ORDER — SODIUM CHLORIDE 0.9 % IV BOLUS (SEPSIS)
1000.0000 mL | Freq: Once | INTRAVENOUS | Status: AC
  Administered 2018-06-04: 1000 mL via INTRAVENOUS

## 2018-06-04 MED ORDER — ADENOSINE 6 MG/2ML IV SOLN: 12.0000 mg | Freq: Once | INTRAVENOUS | Status: DC

## 2018-06-04 MED ORDER — SODIUM POLYSTYRENE SULFONATE 15 GM/60ML PO SUSP
30.0000 g | Freq: Once | ORAL | Status: AC
Start: 2018-06-04 — End: 2018-06-04
  Administered 2018-06-04: 30 g via ORAL
  Filled 2018-06-04: qty 120

## 2018-06-04 MED ORDER — SODIUM CHLORIDE 0.9 % IV SOLN
1.0000 g | INTRAVENOUS | Status: DC
  Filled 2018-06-04 (×2): qty 10

## 2018-06-04 MED ORDER — ADENOSINE 6 MG/2ML IV SOLN
INTRAVENOUS | Status: AC
  Filled 2018-06-04: qty 6

## 2018-06-04 MED ORDER — ADULT MULTIVITAMIN W/MINERALS CH
1.0000 | ORAL_TABLET | Freq: Every day | ORAL | Status: DC
  Administered 2018-06-04 – 2018-06-20 (×17): 1 via ORAL
  Filled 2018-06-04 (×17): qty 1

## 2018-06-04 MED ORDER — DILTIAZEM HCL-DEXTROSE 100-5 MG/100ML-% IV SOLN (PREMIX)
5.0000 mg/h | INTRAVENOUS | Status: DC
  Administered 2018-06-04: 5 mg/h via INTRAVENOUS
  Administered 2018-06-04: 15 mg/h via INTRAVENOUS
  Administered 2018-06-05: 12.5 mg/h via INTRAVENOUS
  Administered 2018-06-05: 7.5 mg/h via INTRAVENOUS
  Administered 2018-06-06 (×2): 15 mg/h via INTRAVENOUS
  Filled 2018-06-04 (×6): qty 100

## 2018-06-04 MED ORDER — FOLIC ACID 5 MG/ML IJ SOLN
INTRAMUSCULAR | Status: AC
  Filled 2018-06-04: qty 0.2

## 2018-06-04 MED ORDER — DILTIAZEM LOAD VIA INFUSION
20.0000 mg | Freq: Once | INTRAVENOUS | Status: DC
  Administered 2018-06-04: 20 mg via INTRAVENOUS
  Filled 2018-06-04: qty 20

## 2018-06-04 MED ORDER — THIAMINE HCL 100 MG/ML IJ SOLN
100.0000 mg | Freq: Every day | INTRAMUSCULAR | Status: DC
  Administered 2018-06-04: 100 mg via INTRAVENOUS
  Filled 2018-06-04 (×6): qty 2

## 2018-06-04 MED ORDER — IPRATROPIUM-ALBUTEROL 0.5-2.5 (3) MG/3ML IN SOLN
3.0000 mL | Freq: Four times a day (QID) | RESPIRATORY_TRACT | Status: DC | PRN
  Administered 2018-06-05: 3 mL via RESPIRATORY_TRACT
  Filled 2018-06-04: qty 3

## 2018-06-04 MED ORDER — SODIUM BICARBONATE 8.4 % IV SOLN
50.0000 meq | Freq: Once | INTRAVENOUS | Status: AC
  Administered 2018-06-04: 50 meq via INTRAVENOUS
  Filled 2018-06-04: qty 50

## 2018-06-04 MED ORDER — M.V.I. ADULT IV INJ
INJECTION | INTRAVENOUS | Status: AC
  Filled 2018-06-04: qty 10

## 2018-06-04 MED ORDER — LORAZEPAM 1 MG PO TABS: 1.0000 mg | ORAL_TABLET | Freq: Four times a day (QID) | ORAL | Status: AC | PRN

## 2018-06-04 MED ORDER — DILTIAZEM HCL 25 MG/5ML IV SOLN
10.0000 mg | Freq: Once | INTRAVENOUS | Status: AC
  Administered 2018-06-04: 10 mg via INTRAVENOUS

## 2018-06-04 MED ORDER — THIAMINE HCL 100 MG/ML IJ SOLN
INTRAMUSCULAR | Status: AC
  Filled 2018-06-04: qty 2

## 2018-06-04 MED ORDER — SODIUM CHLORIDE 0.9 % IV SOLN
500.0000 mg | INTRAVENOUS | Status: DC
  Administered 2018-06-04 – 2018-06-06 (×3): 500 mg via INTRAVENOUS
  Filled 2018-06-04 (×5): qty 500

## 2018-06-04 MED ORDER — ASPIRIN EC 81 MG PO TBEC
81.0000 mg | DELAYED_RELEASE_TABLET | Freq: Every day | ORAL | Status: DC
  Administered 2018-06-04 – 2018-06-11 (×8): 81 mg via ORAL
  Filled 2018-06-04 (×19): qty 1

## 2018-06-04 MED ORDER — FOLIC ACID 1 MG PO TABS
1.0000 mg | ORAL_TABLET | Freq: Every day | ORAL | Status: DC
  Administered 2018-06-04 – 2018-06-20 (×17): 1 mg via ORAL
  Filled 2018-06-04 (×17): qty 1

## 2018-06-04 MED ORDER — LORAZEPAM 2 MG/ML IJ SOLN
1.0000 mg | Freq: Four times a day (QID) | INTRAMUSCULAR | Status: AC | PRN
  Administered 2018-06-04: 1 mg via INTRAVENOUS
  Filled 2018-06-04: qty 1

## 2018-06-04 NOTE — ED Triage Notes (Addendum)
Patient c/o constipation and abd distension x5 days. Denies any nausea or vomiting. Per patient has had a bowel blockage in past, no surgery. Patient took [place] and Mirilax yesterday with no relief. Patient has some confusion today and generalized weakness per family. Family member denies any confusion yesterday. [place] 10:30pm 8/9
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
## 2018-06-04 NOTE — H&P (Signed)
History and Physical    [name] AUQ:[id number] DOB: Dec 04, 1965 DOA: 06/04/2018  PCP: [name], MD  Patient coming from: Home  Chief Complaint: Weakness and abdominal pain  HPI: [name] is a 52 y.o. female with medical history significant of polysubstance abuse has been sober for a year per her report, chronic systolic and diastolic congestive heart failure, chronic kidney disease with baseline creatinine less than 2, COPD, diabetes, hypertension, proximal atrial tachycardia comes in for several days of not feeling well and getting progressively weak.  Patient reports that for over 48 hours she has been having a lot of dysuria and since this time she has had markedly decreased urinary output.  She been having some lower abdominal cramps associated with this.  She is been very nauseous and very very weak.  She denies any fevers.  She denies any vomiting.  She denies any diarrhea.  She denies any cough.  She has been short of breath.  On arrival to the ED her heart rate was in the 638L and her systolic blood pressure in the 70s.  She is been given several liters of IV fluid and a Cardizem push in her heart rate and blood pressure have responded very well.  She is found to have a UTI with acute kidney injury with a creatinine of 7 and a potassium level 6.6.  She is also been found to have an infiltrate on chest x-ray.  Patient is being referred for admission for sepsis secondary to UTI pneumonia.  Patient reports she is feeling much better after the 2 L she is received in the ED but nowhere near normal.   Review of Systems: As per HPI otherwise 10 point review of systems negative.   Past Medical History:  Diagnosis Date  . Alcohol use   . Ankle fracture, lateral malleolus, closed 12/30/2011  . Anxiety   . Breast mass, left 12/15/2011  . Chronic anemia   . Chronic combined systolic and diastolic CHF (congestive heart failure) ([place])   . CKD (chronic kidney disease), stage  III ([place])    pt denies  . Cocaine abuse ([place])   . COPD (chronic obstructive pulmonary disease) ([place])   . Diabetes mellitus, type 2 ([place])   . Diabetic Charcot foot ([place]) 12/30/2011  . Essential hypertension   . History of GI bleed 12/05/2009   Qualifier: Diagnosis of  By: Zeb Comfort    . Hyperlipidemia   . Hypertensive cardiomyopathy ([place]) 11/08/2012   a. 05/2016: echo showing EF of 20-25% with normal cors by cath in 07/2016.  Marland Kitchen Noncompliance with medication regimen   . Obesity   . Panic attacks   . PAT (paroxysmal atrial tachycardia) ([place])    a. started on amiodarone 07/2017 for this.  . Sleep apnea    does not use cpap  . Stroke ([place])    06/24/17  . Tobacco abuse   . Urinary incontinence     Past Surgical History:  Procedure Laterality Date  . BREAST BIOPSY    . CARDIAC CATHETERIZATION N/A 07/28/2016   Procedure: Left Heart Cath and Coronary Angiography;  Surgeon: Jettie Booze, MD;  Location: [facility];  Service: Cardiovascular;  Laterality: N/A;  . DILATION AND CURETTAGE OF UTERUS    . I&D EXTREMITY Bilateral 09/22/2017   Procedure: BILATERAL DEBRIDEMENT LEG/FOOT ULCERS, APPLY VERAFLO WOUND VAC;  Surgeon: Newt Minion, MD;  Location: [facility];  Service: Orthopedics;  Laterality: Bilateral;  . SKIN SPLIT GRAFT  Bilateral 09/28/2017   Procedure: BILATERAL SPLIT THICKNESS SKIN GRAFT LEGS/FEET AND APPLY VAC;  Surgeon: Newt Minion, MD;  Location: [facility];  Service: Orthopedics;  Laterality: Bilateral;     reports that she quit smoking about a year ago. Her smoking use included cigarettes. She has a 20.00 pack-year smoking history. She has never used smokeless tobacco. She reports that she has current or past drug history. Drugs: Marijuana and Cocaine. Frequency: 3.00 times per week. She reports that she does not drink alcohol.  No Known Allergies  Family History  Problem Relation Age of Onset  . Hypertension Mother   . Heart attack Mother   . Hypertension  Father        CABG   . Hypertension Sister   . Hypertension Brother   . Hypertension Sister   . Cancer Sister        breast   . Arthritis Unknown   . Cancer Unknown   . Diabetes Unknown   . Asthma Unknown   . Hypertension Daughter   . Hypertension Son     Prior to Admission medications   Medication Sig Start Date End Date Taking? Authorizing Provider  ACCU-CHEK FASTCLIX LANCETS MISC Test up to 4 times daily DX e11.65 09/07/17   [name], MD  albuterol [facility] HFA) 108 (90 Base) MCG/ACT inhaler INHALE 2 PUFFS EVERY 6 HOURS AS NEEDED FOR SHORTNESS OF BREATH/WHEEZING. 02/09/18   [name], MD  amiodarone (PACERONE) 200 MG tablet Take 1 tablet (200 mg total) by mouth 2 (two) times daily. 08/14/17   Oswald Hillock, MD  amLODipine (NORVASC) 2.5 MG tablet Take 1 tablet (2.5 mg total) by mouth daily. 05/26/18   [name], MD  aspirin EC 81 MG EC tablet Take 1 tablet (81 mg total) by mouth daily. 07/01/17   Mikhail, Velta Addison, DO  Azelastine HCl (ASTEPRO) 0.15 % SOLN Place 1 spray into the nose every 12 (twelve) hours as needed (for allergies).     [provider]  Blood Glucose Monitoring Suppl (ACCU-CHEK AVIVA PLUS) w/Device KIT Four times daily testing dx E11.65 09/07/17   [name], MD  carvedilol (COREG) 25 MG tablet Take 1 tablet (25 mg total) by mouth 2 (two) times daily with a meal. 08/06/17   Oswald Hillock, MD  clopidogrel (PLAVIX) 75 MG tablet TAKE 1 TABLET BY MOUTH DAILY WITH BREAKFAST. 05/27/18   [name], MD  Cranberry 400 MG TABS Take 1 tablet by mouth daily.    [provider]  ezetimibe (ZETIA) 10 MG tablet TAKE 1 TABLET BY MOUTH ONCE A DAY. 05/27/18   [name], MD  FLUoxetine (PROZAC) 20 MG capsule TAKE 1 CAPSULE BY MOUTH EVERY DAY. 05/27/18   [name], MD  furosemide (LASIX) 20 MG tablet TAKE 1 TABLET BY MOUTH ONCE A DAY. 05/27/18   [name], MD  gabapentin (NEURONTIN) 100 MG capsule TAKE 1  CAPSULE BY MOUTH THREE TIMES A DAY. 05/27/18   [name], MD  gabapentin (NEURONTIN) 300 MG capsule Take 1 capsule (300 mg total) by mouth 3 (three) times daily. 3 times a day when necessary neuropathy pain 05/30/18   Newt Minion, MD  glucose blood (ACCU-CHEK AVIVA) test strip Use as instructed four times daily dx: E11.65 09/07/17   [name], MD  hydrALAZINE (APRESOLINE) 25 MG tablet Take 25 mg 3 (three) times daily by mouth.    [provider]  insulin detemir (  LEVEMIR) 100 UNIT/ML injection Inject 0.08 mLs (8 Units total) into the skin at bedtime. 01/14/18   [name], MD  ipratropium-albuterol (DUONEB) 0.5-2.5 (3) MG/3ML SOLN Take 3 mLs by nebulization every 6 (six) hours as needed (wheezes or SOB).     [provider]  linagliptin (TRADJENTA) 5 MG TABS tablet Take 1 tablet (5 mg total) by mouth daily. 02/17/18   [name], MD  meclizine (ANTIVERT) 25 MG tablet Take 1 tablet (25 mg total) by mouth 3 (three) times daily as needed for dizziness. 09/14/17   Long, Wonda Olds, MD  montelukast (SINGULAIR) 10 MG tablet TAKE (1) TABLET BY MOUTH AT BEDTIME. 05/27/18   [name], MD  Multiple Vitamin (MULTIVITAMIN WITH MINERALS) TABS tablet Take 1 tablet by mouth daily. 07/01/17   Mikhail, Velta Addison, DO  olmesartan (BENICAR) 40 MG tablet Take 1 tablet (40 mg total) by mouth daily. 05/05/18   [name], MD  pantoprazole (PROTONIX) 40 MG tablet Take 1 tablet (40 mg total) by mouth daily. 02/09/18   [name], MD  potassium chloride SA (K-DUR,KLOR-CON) 20 MEQ tablet TAKE 1&1/2 TABLET BY MOUTH DAILY. 02/09/18   [name], MD  potassium chloride SA (K-DUR,KLOR-CON) 20 MEQ tablet TAKE 1&1/2 TABLET BY MOUTH DAILY. 05/27/18   [name], MD  rosuvastatin (CRESTOR) 10 MG tablet TAKE 1 TABLET BY MOUTH ONCE A DAY. 05/30/18   [name], MD  silver sulfADIAZINE (SILVADENE) 1 % cream Apply 1 application topically daily.  09/20/17   Newt Minion, MD  thiamine 100 MG tablet Take 1 tablet (100 mg total) by mouth daily. 07/01/17   Mikhail, Velta Addison, DO  tiotropium (SPIRIVA HANDIHALER) 18 MCG inhalation capsule Place 18 mcg into inhaler and inhale daily. 2 puffs    [provider]  traMADol (ULTRAM) 50 MG tablet One tablet at bedtime for bilateral foot pain 05/05/18   [name], MD  traZODone (DESYREL) 50 MG tablet TAKE (1) TABLET BY MOUTH AT BEDTIME. 02/09/18   [name], MD  traZODone (DESYREL) 50 MG tablet TAKE (1) TABLET BY MOUTH AT BEDTIME. 05/27/18   [name], MD  UNABLE TO [place] chair x 1 02/17/18   [name], MD  UNABLE TO FIND Incontinence pullups and supplies Use daily as needed DX: urinary incontinence 02/17/18   [name], MD  Vitamin D, Ergocalciferol, (DRISDOL) 50000 units CAPS capsule Take 1 capsule (50,000 Units total) by mouth once a week. 03/02/17   [name], MD    Physical Exam: Vitals:   06/04/18 1536 06/04/18 1540 06/04/18 1544 06/04/18 1545  BP: 101/66 91/67  98/80  Pulse:      Resp: (!) 24 (!) 24 (!) 23 (!) 27  Temp:      SpO2:      Height:          Constitutional: NAD, calm, comfortable Vitals:   06/04/18 1536 06/04/18 1540 06/04/18 1544 06/04/18 1545  BP: 101/66 91/67  98/80  Pulse:      Resp: (!) 24 (!) 24 (!) 23 (!) 27  Temp:      SpO2:      Height:       Eyes: PERRL, lids and conjunctivae normal ENMT: Mucous membranes are moist. Posterior pharynx clear of any exudate or lesions.Normal dentition.  Neck: normal, supple, no masses, no thyromegaly Respiratory: clear to auscultation bilaterally, no wheezing, no crackles. Normal respiratory effort. No accessory muscle use.  Cardiovascular: Regular rate and rhythm, no murmurs / rubs / gallops. No extremity edema. 2+ pedal pulses. No carotid bruits.  Abdomen: no tenderness, no masses palpated. No hepatosplenomegaly. Bowel sounds positive.  Urine cath has been  placed and is very dark urine with less than 200 cc in bag since placed in the ED Musculoskeletal: no clubbing / cyanosis. No joint deformity upper and lower extremities. Good ROM, no contractures. Normal muscle tone.  Skin: no rashes, lesions, ulcers. No induration Neurologic: CN 2-12 grossly intact. Sensation intact, DTR normal. Strength 5/5 in all 4.  Psychiatric: Normal judgment and insight. Alert and oriented x 3. Normal mood.    Labs on Admission: I have personally reviewed following labs and imaging studies  CBC: Recent Labs  Lab 06/04/18 1428  WBC 10.2  NEUTROABS 9.4*  HGB 11.4*  HCT 35.3*  MCV 89.1  PLT 631   Basic Metabolic Panel: Recent Labs  Lab 06/04/18 1428  NA 135  K 6.6*  CL 101  CO2 16*  GLUCOSE 175*  BUN 106*  CREATININE 6.85*  CALCIUM 9.2   GFR: Estimated Creatinine Clearance: 8.3 mL/min (A) (by C-G formula based on SCr of 6.85 mg/dL (H)). Liver Function Tests: Recent Labs  Lab 06/04/18 1428  AST 14*  ALT 10  ALKPHOS 87  BILITOT 1.0  PROT 8.1  ALBUMIN 2.4*   Recent Labs  Lab 06/04/18 1428  LIPASE 17   No results for input(s): AMMONIA in the last 168 hours. Coagulation Profile: Recent Labs  Lab 06/04/18 1428  INR 1.41   Cardiac Enzymes: Recent Labs  Lab 06/04/18 1428  TROPONINI <0.03   BNP (last 3 results) No results for input(s): PROBNP in the last 8760 hours. HbA1C: No results for input(s): HGBA1C in the last 72 hours. CBG: No results for input(s): GLUCAP in the last 168 hours. Lipid Profile: No results for input(s): CHOL, HDL, LDLCALC, TRIG, CHOLHDL, LDLDIRECT in the last 72 hours. Thyroid Function Tests: Recent Labs    06/04/18 1428  TSH 0.694   Anemia Panel: No results for input(s): VITAMINB12, FOLATE, FERRITIN, TIBC, IRON, RETICCTPCT in the last 72 hours. Urine analysis:    Component Value Date/Time   COLORURINE YELLOW 06/04/2018 1407   APPEARANCEUR TURBID (A) 06/04/2018 1407   LABSPEC 1.014 06/04/2018 1407     PHURINE 5.0 06/04/2018 1407   GLUCOSEU NEGATIVE 06/04/2018 1407   HGBUR MODERATE (A) 06/04/2018 1407   BILIRUBINUR NEGATIVE 06/04/2018 1407   KETONESUR NEGATIVE 06/04/2018 1407   PROTEINUR 100 (A) 06/04/2018 1407   UROBILINOGEN 0.2 09/11/2014 1319   NITRITE NEGATIVE 06/04/2018 1407   LEUKOCYTESUR MODERATE (A) 06/04/2018 1407   Sepsis Labs: !!!!!!!!!!!!!!!!!!!!!!!!!!!!!!!!!!!!!!!!!!!! '@[email or web address]' (procalcitonin:4,lacticidven:4) ) Recent Results (from the past 240 hour(s))  Blood Culture (routine x 2)     Status: None (Preliminary result)   Collection Time: 06/04/18  2:47 PM  Result Value Ref Range Status   Specimen Description BLOOD RIGHT HAND  Final   Special Requests   Final    BOTTLES DRAWN AEROBIC ONLY Blood Culture adequate volume Performed at [facility], [address], [place], [place] [address]    Culture PENDING  Incomplete   Report Status PENDING  Incomplete  Blood Culture (routine x 2)     Status: None (Preliminary result)   Collection Time: 06/04/18  2:55 PM  Result Value Ref Range Status   Specimen Description RIGHT ANTECUBITAL  Final   Special Requests   Final    BOTTLES DRAWN AEROBIC AND ANAEROBIC Blood Culture adequate  volume Performed at [facility], [address] [place], [place] [address]    Culture PENDING  Incomplete   Report Status PENDING  Incomplete     Radiological Exams on Admission: Dg Chest Port 1 View  Result Date: 06/04/2018 CLINICAL DATA:  Tachycardia, upper abdomen pain and constipation for 5 days. EXAM: PORTABLE CHEST 1 VIEW COMPARISON:  June 30, 2017 FINDINGS: The mediastinal contour is normal. Heart size is enlarged. Consolidation of right lung base is identified. There is mild central pulmonary vascular congestion. There is elevation of the right hemidiaphragm unchanged. The visualized skeletal structures are stable. IMPRESSION: Right lung base pneumonia.  Central pulmonary vascular congestion. Electronically Signed   By: Abelardo Diesel  M.D.   On: 06/04/2018 14:43    EKG: Independently reviewed.  A. fib with RVR Old chart reviewed Case discussed with Dr. Sabra Heck in the ED Chest x-ray reviewed right lower lobe infiltrate  Assessment/Plan 52 year old female with sepsis secondary to UTI and pneumonia with acute kidney injury Principal Problem:   Sepsis (HCC)-patient has both UTI and pneumonia.  Placed on IV Rocephin and azithromycin.  Obtain blood cultures and sputum culture and urine culture.  Treat infection and treat renal failure as below.  Lactic acid mildly elevated at 2.4 will serial lesion to they normalize.  We will also check baseline procalcitonin level at this time.  Blood pressure is much improved in the 90s now after 2 L of IV fluid.  Heart rate is also down into the 100 range.  Active Problems:   CAP (community acquired pneumonia)-IV Rocephin and azithromycin.  Obtain blood cultures and sputum cultures.    Hyperkalemia-potassium 6.6 on arrival with a creatinine of 7.  This is been appropriately treated.  Repeat studies are pending right now and will provide further treatment pending these results.  Patient taking potassium pills at home obviously will stop this.    AKI (acute kidney injury) (HCC)-creatinine again bumped up to 7.  IV fluids.  Treat infection.  Hold all diuretics and blood pressure meds at this time.  Monitor urinary output closely.  Hold Benicar.    Acute lower UTI-IV Rocephin and treat urine culture.  Monitor urinary output closely.    Alcohol abuse-reports sober at this time.  Provide banana bag x1.    CKD (chronic kidney disease) stage 3, GFR 30-59 ml/min (HCC)-noted    Polysubstance abuse (including cocaine)-patient reports being sober for a year.  Check urine drug screen.    Chronic pain syndrome-resume home meds.    Chronic combined systolic and diastolic CHF (congestive heart failure) (HCC)-currently stable at this time.  Holding all blood pressure meds and diuretics at this time.     DM (diabetes mellitus), type 2, uncontrolled, with renal complications (HCC)-placed on sliding scale insulin    COPD (chronic obstructive pulmonary disease) (HCC)-lungs clear continue home nebs currently compensated and stable at this time    A-fib (HCC)-patient was now converted to normal sinus rhythm with a rate around 100.  She is been given diltiazem in the emergency department.  She has been on amiodarone.  We will continue this.  She is also been on Coreg  we will hold this secondary to her hypotension on arrival.    Med requisition is pending   DVT prophylaxis: SCDs Code Status: Full Family Communication: None Disposition Plan: Days Consults called: None Admission status: Admission   Ethelyn Cerniglia A MD [facility]  If 7PM-7AM, please contact night-coverage [email or web address] Password [email or web address]  06/04/2018, 4:16 PM

## 2018-06-04 NOTE — ED Notes (Signed)
CRITICAL VALUE ALERT  Critical Value: Kt 6.6  Date & Time Notied: 5916 06/04/18  Provider Notified:Miller Orders Received/Actions taken:

## 2018-06-04 NOTE — Progress Notes (Signed)
A-fib rate 140-150's continues while on Cardizem gtt @ 15 mg/hr. Pt denies CP or SOB. Mid level paged.

## 2018-06-05 ENCOUNTER — Inpatient Hospital Stay (HOSPITAL_COMMUNITY): Payer: Medicare Other

## 2018-06-05 DIAGNOSIS — N3000 Acute cystitis without hematuria: Secondary | ICD-10-CM

## 2018-06-05 DIAGNOSIS — J449 Chronic obstructive pulmonary disease, unspecified: Secondary | ICD-10-CM

## 2018-06-05 DIAGNOSIS — J181 Lobar pneumonia, unspecified organism: Secondary | ICD-10-CM

## 2018-06-05 LAB — BASIC METABOLIC PANEL
Anion gap: 14 (ref 5–15)
BUN: 94 mg/dL — ABNORMAL HIGH (ref 6–20)
CO2: 18 mmol/L — ABNORMAL LOW (ref 22–32)
Calcium: 8.1 mg/dL — ABNORMAL LOW (ref 8.9–10.3)
Chloride: 106 mmol/L (ref 98–111)
Creatinine, Ser: 5.32 mg/dL — ABNORMAL HIGH (ref 0.44–1.00)
GFR calc Af Amer: 10 mL/min — ABNORMAL LOW (ref >60)
GFR calc non Af Amer: 8 mL/min — ABNORMAL LOW (ref >60)
Glucose, Bld: 203 mg/dL — ABNORMAL HIGH (ref 70–99)
Potassium: 6 mmol/L — ABNORMAL HIGH (ref 3.5–5.1)
Sodium: 138 mmol/L (ref 135–145)

## 2018-06-05 LAB — CBC WITH DIFFERENTIAL/PLATELET
Basophils Absolute: 0 10*3/uL (ref 0.0–0.1)
Basophils Relative: 0 %
Eosinophils Absolute: 0.1 10*3/uL (ref 0.0–0.7)
Eosinophils Relative: 1 %
HCT: 29.1 % — ABNORMAL LOW (ref 36.0–46.0)
Hemoglobin: 9.3 g/dL — ABNORMAL LOW (ref 12.0–15.0)
Lymphocytes Relative: 7 %
Lymphs Abs: 0.6 10*3/uL — ABNORMAL LOW (ref 0.7–4.0)
MCH: 28.1 pg (ref 26.0–34.0)
MCHC: 32 g/dL (ref 30.0–36.0)
MCV: 87.9 fL (ref 78.0–100.0)
Monocytes Absolute: 0.3 10*3/uL (ref 0.1–1.0)
Monocytes Relative: 4 %
Neutro Abs: 7.7 10*3/uL (ref 1.7–7.7)
Neutrophils Relative %: 88 %
Platelets: 297 10*3/uL (ref 150–400)
RBC: 3.31 MIL/uL — ABNORMAL LOW (ref 3.87–5.11)
RDW: 16.1 % — ABNORMAL HIGH (ref 11.5–15.5)
WBC: 8.7 10*3/uL (ref 4.0–10.5)

## 2018-06-05 LAB — PROCALCITONIN: Procalcitonin: 59.18 ng/mL

## 2018-06-05 LAB — GLUCOSE, CAPILLARY
Glucose-Capillary: 300 mg/dL — ABNORMAL HIGH (ref 70–99)
Glucose-Capillary: 222 mg/dL — ABNORMAL HIGH (ref 70–99)

## 2018-06-05 LAB — HIV ANTIBODY (ROUTINE TESTING W REFLEX): HIV Screen 4th Generation wRfx: NONREACTIVE

## 2018-06-05 MED ORDER — HEPARIN SODIUM (PORCINE) 5000 UNIT/ML IJ SOLN
5000.0000 [IU] | Freq: Three times a day (TID) | INTRAMUSCULAR | Status: DC
  Administered 2018-06-05 – 2018-06-06 (×3): 5000 [IU] via SUBCUTANEOUS
  Filled 2018-06-05 (×3): qty 1

## 2018-06-05 MED ORDER — SODIUM POLYSTYRENE SULFONATE 15 GM/60ML PO SUSP
30.0000 g | Freq: Once | ORAL | Status: AC
  Administered 2018-06-05: 30 g via ORAL
  Filled 2018-06-05: qty 120

## 2018-06-05 MED ORDER — INSULIN ASPART 100 UNIT/ML ~~LOC~~ SOLN
0.0000 [IU] | Freq: Every day | SUBCUTANEOUS | Status: DC
  Administered 2018-06-05: 2 [IU] via SUBCUTANEOUS

## 2018-06-05 MED ORDER — SODIUM CHLORIDE 0.9 % IV BOLUS
500.0000 mL | Freq: Once | INTRAVENOUS | Status: AC
  Administered 2018-06-05: 500 mL via INTRAVENOUS

## 2018-06-05 MED ORDER — INSULIN ASPART 100 UNIT/ML ~~LOC~~ SOLN
0.0000 [IU] | Freq: Three times a day (TID) | SUBCUTANEOUS | Status: DC
  Administered 2018-06-05: 5 [IU] via SUBCUTANEOUS
  Administered 2018-06-05 – 2018-06-06 (×2): 2 [IU] via SUBCUTANEOUS

## 2018-06-05 MED ORDER — METOPROLOL TARTRATE 5 MG/5ML IV SOLN
2.5000 mg | Freq: Once | INTRAVENOUS | Status: AC
  Administered 2018-06-05: 2.5 mg via INTRAVENOUS
  Filled 2018-06-05: qty 5

## 2018-06-05 MED ORDER — INSULIN DETEMIR 100 UNIT/ML ~~LOC~~ SOLN
10.0000 [IU] | Freq: Every day | SUBCUTANEOUS | Status: DC
  Administered 2018-06-05 – 2018-06-18 (×9): 10 [IU] via SUBCUTANEOUS
  Filled 2018-06-05 (×16): qty 0.1

## 2018-06-05 MED ORDER — PATIROMER SORBITEX CALCIUM 8.4 G PO PACK
25.2000 g | PACK | Freq: Every day | ORAL | Status: DC
  Filled 2018-06-05 (×2): qty 3

## 2018-06-05 NOTE — Progress Notes (Addendum)
Shift event: Notified by RN regarding pt's persistent increased HR 130-150's (A-Fib) despite being at max dose of Cardizem qtt. Pt asymptomatic. BP's have started to trend down as well since Cardizem increased to maximum dose at approx 2100. Discussed pt briefly with Dr Charissa Bash w/ cardiology service who recommended small dose of either po Metoprolol or IV Lopressor and IVF bolus. Relayed info to RN. Orders placed for NS 500 cc IVFB and Lopressor 2.5 mg IV. RN to f/u w/ [facility] on call MD for AP tonight if no improvement. Will continue to monitor closely in ICU.  Jeryl [place], NP [facility] Pager [phone]

## 2018-06-05 NOTE — Consult Note (Signed)
Reason for Consult: Acute kidney injury superimposed on chronic and hyperkalemia Referring Physician: Dr. Lucillie Garfinkel is an 52 y.o. female.  HPI: She is a patient who has history of diabetes, hypertension, COPD, paroxysmal atrial tachycardia, history of chronic renal failure presently came to the emergency room with complaints of weakness, abdominal pain.  Also she has nausea for the last couple of days but no vomiting.  She denies any diarrhea.  Complains of decreased urine output.  When she was evaluated in the emergency room patient was found to have hyperkalemia: Worsening of renal failure, atrial fibrillation with heart rate of 180 and hypotensive.  Presently patient is feeling somewhat better.  States she has some nausea but no vomiting.  Presently denies any difficulty breathing.  Past Medical History:  Diagnosis Date  . Alcohol use   . Ankle fracture, lateral malleolus, closed 12/30/2011  . Anxiety   . Breast mass, left 12/15/2011  . Chronic anemia   . Chronic combined systolic and diastolic CHF (congestive heart failure) ([place])   . CKD (chronic kidney disease), stage III ([place])    pt denies  . Cocaine abuse ([place])   . COPD (chronic obstructive pulmonary disease) ([place])   . Diabetes mellitus, type 2 ([place])   . Diabetic Charcot foot ([place]) 12/30/2011  . Essential hypertension   . History of GI bleed 12/05/2009   Qualifier: Diagnosis of  By: Zeb Comfort    . Hyperlipidemia   . Hypertensive cardiomyopathy ([place]) 11/08/2012   a. 05/2016: echo showing EF of 20-25% with normal cors by cath in 07/2016.  Marland Kitchen Noncompliance with medication regimen   . Obesity   . Panic attacks   . PAT (paroxysmal atrial tachycardia) ([place])    a. started on amiodarone 07/2017 for this.  . Sleep apnea    does not use cpap  . Stroke ([place])    06/24/17  . Tobacco abuse   . Urinary incontinence     Past Surgical History:  Procedure Laterality Date  . BREAST BIOPSY    . CARDIAC CATHETERIZATION N/A  07/28/2016   Procedure: Left Heart Cath and Coronary Angiography;  Surgeon: Jettie Booze, MD;  Location: [facility];  Service: Cardiovascular;  Laterality: N/A;  . DILATION AND CURETTAGE OF UTERUS    . I&D EXTREMITY Bilateral 09/22/2017   Procedure: BILATERAL DEBRIDEMENT LEG/FOOT ULCERS, APPLY VERAFLO WOUND VAC;  Surgeon: Newt Minion, MD;  Location: [facility];  Service: Orthopedics;  Laterality: Bilateral;  . SKIN SPLIT GRAFT Bilateral 09/28/2017   Procedure: BILATERAL SPLIT THICKNESS SKIN GRAFT LEGS/FEET AND APPLY VAC;  Surgeon: Newt Minion, MD;  Location: [facility];  Service: Orthopedics;  Laterality: Bilateral;    Family History  Problem Relation Age of Onset  . Hypertension Mother   . Heart attack Mother   . Hypertension Father        CABG   . Hypertension Sister   . Hypertension Brother   . Hypertension Sister   . Cancer Sister        breast   . Arthritis Unknown   . Cancer Unknown   . Diabetes Unknown   . Asthma Unknown   . Hypertension Daughter   . Hypertension Son     Social History:  reports that she quit smoking about a year ago. Her smoking use included cigarettes. She has a 20.00 pack-year smoking history. She has never used smokeless tobacco. She reports that she has current or past drug history. Drugs: Marijuana  and Cocaine. Frequency: 3.00 times per week. She reports that she does not drink alcohol.  Allergies: No Known Allergies  Medications: I have reviewed the patient's current medications.  Results for orders placed or performed during the hospital encounter of 06/04/18 (from the past 48 hour(s))  Urinalysis, Routine w reflex microscopic     Status: Abnormal   Collection Time: 06/04/18  2:07 PM  Result Value Ref Range   Color, Urine YELLOW YELLOW   APPearance TURBID (A) CLEAR   Specific Gravity, Urine 1.014 1.005 - 1.030   pH 5.0 5.0 - 8.0   Glucose, UA NEGATIVE NEGATIVE mg/dL   Hgb urine dipstick MODERATE (A) NEGATIVE   Bilirubin Urine  NEGATIVE NEGATIVE   Ketones, ur NEGATIVE NEGATIVE mg/dL   Protein, ur 100 (A) NEGATIVE mg/dL   Nitrite NEGATIVE NEGATIVE   Leukocytes, UA MODERATE (A) NEGATIVE   RBC / HPF 21-50 0 - 5 RBC/hpf   WBC, UA >50 (H) 0 - 5 WBC/hpf   Bacteria, UA NONE SEEN NONE SEEN   WBC Clumps PRESENT     Comment: Performed at [facility], [address] [place], [place] [address]  Urine rapid drug screen (hosp performed)     Status: None   Collection Time: 06/04/18  2:07 PM  Result Value Ref Range   Opiates NONE DETECTED NONE DETECTED   Cocaine NONE DETECTED NONE DETECTED   Benzodiazepines NONE DETECTED NONE DETECTED   Amphetamines NONE DETECTED NONE DETECTED   Tetrahydrocannabinol NONE DETECTED NONE DETECTED   Barbiturates NONE DETECTED NONE DETECTED    Comment: (NOTE) DRUG SCREEN FOR MEDICAL PURPOSES ONLY.  IF CONFIRMATION IS NEEDED FOR ANY PURPOSE, NOTIFY LAB WITHIN 5 DAYS. LOWEST DETECTABLE LIMITS FOR URINE DRUG SCREEN Drug Class                     Cutoff (ng/mL) Amphetamine and metabolites    1000 Barbiturate and metabolites    200 Benzodiazepine                 527 Tricyclics and metabolites     300 Opiates and metabolites        300 Cocaine and metabolites        300 THC                            50 Performed at [facility], [address], [place], [place] [address]   Strep pneumoniae urinary antigen     Status: None   Collection Time: 06/04/18  2:07 PM  Result Value Ref Range   Strep Pneumo Urinary Antigen NEGATIVE NEGATIVE    Comment:        Infection due to S. pneumoniae cannot be absolutely ruled out since the antigen present may be below the detection limit of the test.   CBC with Differential/Platelet     Status: Abnormal   Collection Time: 06/04/18  2:28 PM  Result Value Ref Range   WBC 10.2 4.0 - 10.5 K/uL   RBC 3.96 3.87 - 5.11 MIL/uL   Hemoglobin 11.4 (L) 12.0 - 15.0 g/dL   HCT 35.3 (L) 36.0 - 46.0 %   MCV 89.1 78.0 - 100.0 fL   MCH 28.8 26.0 - 34.0 pg   MCHC  32.3 30.0 - 36.0 g/dL   RDW 16.0 (H) 11.5 - 15.5 %   Platelets 358 150 - 400 K/uL   Neutrophils Relative % 92 %   Lymphocytes  Relative 6 %   Monocytes Relative 2 %   Eosinophils Relative 0 %   Basophils Relative 0 %   Neutro Abs 9.4 (H) 1.7 - 7.7 K/uL   Lymphs Abs 0.6 (L) 0.7 - 4.0 K/uL   Monocytes Absolute 0.2 0.1 - 1.0 K/uL   Eosinophils Absolute 0.0 0.0 - 0.7 K/uL   Basophils Absolute 0.0 0.0 - 0.1 K/uL   WBC Morphology DOHLE BODIES     Comment: MILD LEFT SHIFT (1-5% METAS, OCC MYELO, OCC BANDS) Performed at [facility], [address], [place], [place] [address]   Comprehensive metabolic panel     Status: Abnormal   Collection Time: 06/04/18  2:28 PM  Result Value Ref Range   Sodium 135 135 - 145 mmol/L   Potassium 6.6 (HH) 3.5 - 5.1 mmol/L    Comment: CRITICAL RESULT CALLED TO, READ BACK BY AND VERIFIED WITH: ROBYN @ 1962 ON [id number] BY HENDERSON L.    Chloride 101 98 - 111 mmol/L   CO2 16 (L) 22 - 32 mmol/L   Glucose, Bld 175 (H) 70 - 99 mg/dL   BUN 106 (H) 6 - 20 mg/dL   Creatinine, Ser 6.85 (H) 0.44 - 1.00 mg/dL   Calcium 9.2 8.9 - 10.3 mg/dL   Total Protein 8.1 6.5 - 8.1 g/dL   Albumin 2.4 (L) 3.5 - 5.0 g/dL   AST 14 (L) 15 - 41 U/L   ALT 10 0 - 44 U/L   Alkaline Phosphatase 87 38 - 126 U/L   Total Bilirubin 1.0 0.3 - 1.2 mg/dL   GFR calc non Af Amer 6 (L) >60 mL/min   GFR calc Af Amer 7 (L) >60 mL/min    Comment: (NOTE) The eGFR has been calculated using the CKD EPI equation. This calculation has not been validated in all clinical situations. eGFR's persistently <60 mL/min signify possible Chronic Kidney Disease.    Anion gap 18 (H) 5 - 15    Comment: Performed at [facility], [address], [place], [place] [address]  Lipase, blood     Status: None   Collection Time: 06/04/18  2:28 PM  Result Value Ref Range   Lipase 17 11 - 51 U/L    Comment: Performed at [facility], [address] [place], [place] [address]  TSH     Status: None   Collection Time:  06/04/18  2:28 PM  Result Value Ref Range   TSH 0.694 0.350 - 4.500 uIU/mL    Comment: Performed by a 3rd Generation assay with a functional sensitivity of <=0.01 uIU/mL. Performed at [facility], [address] [place], [place] [address]   Ethanol     Status: None   Collection Time: 06/04/18  2:28 PM  Result Value Ref Range   Alcohol, Ethyl (B) <10 <10 mg/dL    Comment: (NOTE) Lowest detectable limit for serum alcohol is 10 mg/dL. For medical purposes only. Performed at [facility], [address] [place], [place] [address]   Protime-INR     Status: Abnormal   Collection Time: 06/04/18  2:28 PM  Result Value Ref Range   Prothrombin Time 17.2 (H) 11.4 - 15.2 seconds   INR 1.41     Comment: Performed at [facility], [address] [place], [place] [address]  APTT     Status: None   Collection Time: 06/04/18  2:28 PM  Result Value Ref Range   aPTT 36 24 - 36 seconds    Comment: Performed at [facility], [address], [place], [place] [address]  Troponin I     Status: None   Collection Time: 06/04/18  2:28 PM  Result Value Ref Range   Troponin I <0.03 <0.03 ng/mL    Comment: Performed at [facility], [address], [place], [place] [address]  HIV antibody (Routine Screening)     Status: None   Collection Time: 06/04/18  2:28 PM  Result Value Ref Range   HIV Screen 4th Generation wRfx Non Reactive Non Reactive    Comment: (NOTE) Performed At: [facility] [place], [place] [id number] Rush Farmer MD [id number]   Procalcitonin - Baseline     Status: None   Collection Time: 06/04/18  2:28 PM  Result Value Ref Range   Procalcitonin 58.72 ng/mL    Comment:        Interpretation: PCT >= 10 ng/mL: Important systemic inflammatory response, almost exclusively due to severe bacterial sepsis or septic shock. (NOTE)       Sepsis PCT Algorithm           Lower Respiratory Tract                                      Infection PCT Algorithm     ----------------------------     ----------------------------         PCT < 0.25 ng/mL                PCT < 0.10 ng/mL         Strongly encourage             Strongly discourage   discontinuation of antibiotics    initiation of antibiotics    ----------------------------     -----------------------------       PCT 0.25 - 0.50 ng/mL            PCT 0.10 - 0.25 ng/mL               OR       >80% decrease in PCT            Discourage initiation of                                            antibiotics      Encourage discontinuation           of antibiotics    ----------------------------     -----------------------------         PCT >= 0.50 ng/mL              PCT 0.26 - 0.50 ng/mL                AND       <80% decrease in PCT             Encourage initiation of                                             antibiotics       Encourage continuation           of antibiotics    ----------------------------     -----------------------------  PCT >= 0.50 ng/mL                  PCT > 0.50 ng/mL               AND         increase in PCT                  Strongly encourage                                      initiation of antibiotics    Strongly encourage escalation           of antibiotics                                     -----------------------------                                           PCT <= 0.25 ng/mL                                                 OR                                        > 80% decrease in PCT                                     Discontinue / Do not initiate                                             antibiotics Performed at [facility], [address] [place], [place] [address]   Blood Culture (routine x 2)     Status: None (Preliminary result)   Collection Time: 06/04/18  2:47 PM  Result Value Ref Range   Specimen Description BLOOD RIGHT HAND    Special Requests      BOTTLES DRAWN AEROBIC ONLY Blood Culture adequate volume Performed at [facility], [address] [place], [place] [address]    Culture PENDING    Report Status PENDING   I-Stat CG4 Lactic Acid, ED     Status: Abnormal   Collection Time: 06/04/18  2:50 PM  Result Value Ref Range   Lactic Acid, Venous 2.44 (HH) 0.5 - 1.9 mmol/L   Comment NOTIFIED PHYSICIAN   Blood Culture (routine x 2)     Status: None (Preliminary result)   Collection Time: 06/04/18  2:55 PM  Result Value Ref Range   Specimen Description RIGHT ANTECUBITAL    Special Requests      BOTTLES DRAWN AEROBIC AND ANAEROBIC Blood Culture adequate volume Performed at [facility], [address], [place], [place] [address]    Culture PENDING    Report Status PENDING   Basic metabolic panel  Status: Abnormal   Collection Time: 06/04/18  4:30 PM  Result Value Ref Range   Sodium 137 135 - 145 mmol/L   Potassium 6.5 (HH) 3.5 - 5.1 mmol/L    Comment: CRITICAL RESULT CALLED TO, READ BACK BY AND VERIFIED WITH: HYLTON L. @ 1716 ON [id number] BY HENDERSON L    Chloride 104 98 - 111 mmol/L   CO2 18 (L) 22 - 32 mmol/L   Glucose, Bld 164 (H) 70 - 99 mg/dL   BUN 100 (H) 6 - 20 mg/dL   Creatinine, Ser 6.32 (H) 0.44 - 1.00 mg/dL   Calcium 8.6 (L) 8.9 - 10.3 mg/dL   GFR calc non Af Amer 7 (L) >60 mL/min   GFR calc Af Amer 8 (L) >60 mL/min    Comment: (NOTE) The eGFR has been calculated using the CKD EPI equation. This calculation has not been validated in all clinical situations. eGFR's persistently <60 mL/min signify possible Chronic Kidney Disease.    Anion gap 15 5 - 15    Comment: Performed at [facility], [address] [place], [place] [address]  Lactic acid, plasma     Status: None   Collection Time: 06/04/18  4:31 PM  Result Value Ref Range   Lactic Acid, Venous 1.9 0.5 - 1.9 mmol/L    Comment: Performed at [facility], [address] [place], [place] [address]  MRSA PCR Screening     Status: None   Collection Time: 06/04/18  5:52 PM  Result Value Ref Range   MRSA by PCR NEGATIVE NEGATIVE     Comment:        The GeneXpert MRSA Assay (FDA approved for NASAL specimens only), is one component of a comprehensive MRSA colonization surveillance program. It is not intended to diagnose MRSA infection nor to guide or monitor treatment for MRSA infections. Performed at [facility], [address], [place], [place] [address]   Lactic acid, plasma     Status: None   Collection Time: 06/04/18  6:27 PM  Result Value Ref Range   Lactic Acid, Venous 1.7 0.5 - 1.9 mmol/L    Comment: Performed at [facility], [address], [place], [place] [address]  Basic metabolic panel     Status: Abnormal   Collection Time: 06/05/18  4:19 AM  Result Value Ref Range   Sodium 138 135 - 145 mmol/L   Potassium 6.0 (H) 3.5 - 5.1 mmol/L   Chloride 106 98 - 111 mmol/L   CO2 18 (L) 22 - 32 mmol/L   Glucose, Bld 203 (H) 70 - 99 mg/dL   BUN 94 (H) 6 - 20 mg/dL   Creatinine, Ser 5.32 (H) 0.44 - 1.00 mg/dL   Calcium 8.1 (L) 8.9 - 10.3 mg/dL   GFR calc non Af Amer 8 (L) >60 mL/min   GFR calc Af Amer 10 (L) >60 mL/min    Comment: (NOTE) The eGFR has been calculated using the CKD EPI equation. This calculation has not been validated in all clinical situations. eGFR's persistently <60 mL/min signify possible Chronic Kidney Disease.    Anion gap 14 5 - 15    Comment: Performed at [facility], [address], [place], [place] [address]  CBC WITH DIFFERENTIAL     Status: Abnormal   Collection Time: 06/05/18  4:19 AM  Result Value Ref Range   WBC 8.7 4.0 - 10.5 K/uL   RBC 3.31 (L) 3.87 - 5.11 MIL/uL   Hemoglobin 9.3 (L) 12.0 - 15.0 g/dL  HCT 29.1 (L) 36.0 - 46.0 %   MCV 87.9 78.0 - 100.0 fL   MCH 28.1 26.0 - 34.0 pg   MCHC 32.0 30.0 - 36.0 g/dL   RDW 16.1 (H) 11.5 - 15.5 %   Platelets 297 150 - 400 K/uL   Neutrophils Relative % 88 %   Lymphocytes Relative 7 %   Monocytes Relative 4 %   Eosinophils Relative 1 %   Basophils Relative 0 %   Neutro Abs 7.7 1.7 - 7.7 K/uL   Lymphs Abs 0.6 (L) 0.7 - 4.0  K/uL   Monocytes Absolute 0.3 0.1 - 1.0 K/uL   Eosinophils Absolute 0.1 0.0 - 0.7 K/uL   Basophils Absolute 0.0 0.0 - 0.1 K/uL   WBC Morphology MILD LEFT SHIFT (1-5% METAS, OCC MYELO, OCC BANDS)     Comment: DOHLE BODIES Performed at [facility], [address] [place], [place] [address]   Procalcitonin     Status: None   Collection Time: 06/05/18  4:19 AM  Result Value Ref Range   Procalcitonin 59.18 ng/mL    Comment:        Interpretation: PCT >= 10 ng/mL: Important systemic inflammatory response, almost exclusively due to severe bacterial sepsis or septic shock. (NOTE)       Sepsis PCT Algorithm           Lower Respiratory Tract                                      Infection PCT Algorithm    ----------------------------     ----------------------------         PCT < 0.25 ng/mL                PCT < 0.10 ng/mL         Strongly encourage             Strongly discourage   discontinuation of antibiotics    initiation of antibiotics    ----------------------------     -----------------------------       PCT 0.25 - 0.50 ng/mL            PCT 0.10 - 0.25 ng/mL               OR       >80% decrease in PCT            Discourage initiation of                                            antibiotics      Encourage discontinuation           of antibiotics    ----------------------------     -----------------------------         PCT >= 0.50 ng/mL              PCT 0.26 - 0.50 ng/mL                AND       <80% decrease in PCT             Encourage initiation of  antibiotics       Encourage continuation           of antibiotics    ----------------------------     -----------------------------        PCT >= 0.50 ng/mL                  PCT > 0.50 ng/mL               AND         increase in PCT                  Strongly encourage                                      initiation of antibiotics    Strongly encourage escalation           of  antibiotics                                     -----------------------------                                           PCT <= 0.25 ng/mL                                                 OR                                        > 80% decrease in PCT                                     Discontinue / Do not initiate                                             antibiotics Performed at [facility], [address], [place], [place] [address]     Dg Chest Port 1 View  Result Date: 06/04/2018 CLINICAL DATA:  Tachycardia, upper abdomen pain and constipation for 5 days. EXAM: PORTABLE CHEST 1 VIEW COMPARISON:  June 30, 2017 FINDINGS: The mediastinal contour is normal. Heart size is enlarged. Consolidation of right lung base is identified. There is mild central pulmonary vascular congestion. There is elevation of the right hemidiaphragm unchanged. The visualized skeletal structures are stable. IMPRESSION: Right lung base pneumonia.  Central pulmonary vascular congestion. Electronically Signed   By: Abelardo Diesel M.D.   On: 06/04/2018 14:43    Review of Systems  Constitutional: Negative for chills.  Respiratory: Positive for cough and sputum production. Negative for shortness of breath.   Cardiovascular: Negative for orthopnea.  Gastrointestinal: Positive for abdominal pain and nausea. Negative for diarrhea and vomiting.       Dry mouth  Genitourinary: Positive for dysuria.       Decrease urine  output  Endo/Heme/Allergies:       Feels thirsty   Blood pressure (!) 88/66, pulse 86, temperature 98.2 F (36.8 C), temperature source Axillary, resp. rate 12, height _0  (1.549 m), weight 72.1 kg, last menstrual period 05/19/2016, SpO2 91 %. Physical Exam  Constitutional: No distress.  Eyes: Left eye exhibits no discharge.  Neck: No JVD present.  Cardiovascular: Normal rate and regular rhythm.  No murmur heard. Respiratory: No respiratory distress. She has no wheezes. She has no rales.  GI:  She exhibits no distension. There is no tenderness. There is no rebound.  Musculoskeletal: She exhibits no edema.  Neurological:  Patient is somnolent but arousable.  She answers questions very slowly.  Patient presently seems to be disoriented and does not know where she is.    Assessment/Plan: Acute kidney injury superimposed on chronic.  The present etiology for her acute kidney injury could be secondary to prerenal syndrome versus ATN.  Her renal function is improving. 2] history of chronic renal failure.  Patient creatinine was 0.87 on 10/30/2013.  She was admitted to the hospital on 08/2014 because of hyperkalemia and acute kidney injury.  The etiology for her acute renal failure was thought to be secondary to ATN/ACE/NSAIDS/cocaine abuse.  Work-up during that time was negative for other causes.  Her renal function recovered before her discharge from the hospital.  Since then her creatinine was 1.14 on 04/03/2015                                        1.431 07/23/2016                                         1.58 on twelve 12/2016                                         1.60 on 02/09/2018 with EGFR of 42 cc/min hence stage III.  Hence patient has underlying stage III chronic renal failure with etiology could be from recurrent acute kidney injury/diabetes/hypertension. 3] hyperkalemia: Possibly from high potassium intake/worsening of renal failure/type IV RTA.  Her potassium is 6 and improving.  Patient was given a dose of Kayexalate. 4] diabetes 5] atrial fibrillation: Presently on Cardizem.  Her heart rate is controlled 6] sepsis: Possibly from pneumonia/UTI.  Her blood pressure is improving but remains low normal.  Patient is on antibiotics. 7] COPD 8] history of sleep apnea: Not on CPAP 9] low CO2: Possibly metabolic. Plan: 1] we will continue with hydration 2] we will do ultrasound of the kidneys 3] we will give her Veltassa 25.2 g 1 dose 4] we will check her renal panel in the  morning.  Khaleem Burchill S 06/05/2018, 8:57 AM

## 2018-06-05 NOTE — Progress Notes (Signed)
PROGRESS NOTE    [name]  BZJ:[id number] DOB: 05-06-1966 DOA: 06/04/2018 PCP: [name], MD    Brief Narrative:  52 year old female with a history of COPD, combined CHF, chronic kidney disease stage III, paroxysmal atrial tachycardia, presents to the hospital with several days of feeling unwell and generally weak.  She has had some abdominal cramping, but no vomiting.  She had noted decreased urine output.  She was noted to be hypotensive and tachycardic on arrival.  She was found to have acute kidney injury and chronic kidney disease with a creatinine of 7 and a potassium of 6.6.  She also had evidence of UTI and possible pneumonia.  She is been started on intravenous antibiotics as well as IV fluids.  Nephrology is following for renal failure and her creatinine is slowly improving.  She is being hydrated.  She is been started on a Cardizem infusion for atrial tachyarrhythmias.  She has since converted back to sinus rhythm and Cardizem is being weaned off.  She is being watched closely in the stepdown unit.   Assessment & Plan:   Principal Problem:   Sepsis ([place]) Active Problems:   Alcohol abuse   Medical non-compliance   CAP (community acquired pneumonia)   CKD (chronic kidney disease) stage 3, GFR 30-59 ml/min (HCC)   Polysubstance abuse (including cocaine)   Hyperkalemia   Chronic pain syndrome   Chronic combined systolic and diastolic CHF (congestive heart failure) ([place])   DM (diabetes mellitus), type 2, uncontrolled, with renal complications (HCC)   COPD (chronic obstructive pulmonary disease) ([place])   AKI (acute kidney injury) ([place])   Acute lower UTI   A-fib ([place])   1. Sepsis.  Possible sources include pneumonia versus urinary tract infection.  Currently on intravenous ceftriaxone and azithromycin.  Cultures have been sent.  She was initially tachycardic and hypotensive on arrival, but these are improving.  Continue current treatments. 2. Acute kidney injury  on chronic kidney disease stage III.  Likely prerenal due to volume depletion.  She is receiving IV fluids.  ARB has been held.  Diuretics currently on hold.  Nephrology following.  She has had some improvement of renal function since being started on IV fluids.  Renal ultrasound has been ordered.  Continue to follow urine output. 3. Hyperkalemia.  Related to acute kidney injury.  Improving with hydration.  She did receive 1 dose of Kayexalate. 4. Community-acquired pneumonia.  Currently on Rocephin and azithromycin.  Cultures are in process. 5. Acute respiratory failure with hypoxia.  Currently on 3 L.  Does appear to be somewhat short of breath.  Initial chest x-ray did indicate underlying pneumonia.  She also has COPD and heart failure.  We will repeat chest x-ray today. 6. Chronic combined CHF.  Ejection fraction of 30 to 35%.  Will need to monitor volume status closely since she is being hydrated. 7. COPD.  No evidence of wheezing at this time.  Continue bronchodilators as needed. 8. History of paroxysmal atrial tachycardia.  Chronically on Coreg and amiodarone.  Coreg currently on hold since she was hypotensive on arrival.  Will try and wean off Cardizem infusion and restart Coreg as blood pressure will allow.  Continue amiodarone. 9. History of cerebral infarction.  Continue on aspirin Plavix. 10. Insulin-dependent diabetes.  Linagliptin currently on hold.  Continue on Levemir.  Start on sliding scale insulin.   DVT prophylaxis: Heparin Code Status: Full code Family Communication: Discussed with sister at the bedside Disposition Plan: Discharge home  once improved continue to monitor in the stepdown unit since she is still on intravenous Cardizem   Consultants:   Nephrology  Procedures:     Antimicrobials:   Ceftriaxone 8/10 >  Azithromycin 8/10 >   Subjective: Denies any shortness of breath.  Says she is feeling better since yesterday.  Still has some abdominal pain/cramping.   No vomiting or diarrhea.  Objective: Vitals:   06/05/18 0630 06/05/18 0645 06/05/18 0756 06/05/18 1031  BP:  (!) 88/66    Pulse: 85 86    Resp: 11 12    Temp:   98.2 F (36.8 C)   TempSrc:   Axillary   SpO2: 91% 91%  99%  Weight:      Height:        Intake/Output Summary (Last 24 hours) at 06/05/2018 1102 Last data filed at 06/05/2018 0921 Gross per 24 hour  Intake 3860.56 ml  Output 825 ml  Net 3035.56 ml   Filed Weights   06/04/18 1752 06/05/18 0519  Weight: 70.7 kg 72.1 kg    Examination:  General exam: Appears calm and comfortable  Respiratory system: Clear to auscultation. Respiratory effort normal. Cardiovascular system: S1 & S2 heard, RRR. No JVD, murmurs, rubs, gallops or clicks. No pedal edema. Gastrointestinal system: Abdomen is nondistended, soft and periumbilical tenderness. No organomegaly or masses felt. Normal bowel sounds heard. Central nervous system: Alert and oriented. No focal neurological deficits. Extremities: Symmetric 5 x 5 power. Skin: No rashes, lesions or ulcers Psychiatry: Judgement and insight appear normal. Mood & affect appropriate.     Data Reviewed: I have personally reviewed following labs and imaging studies  CBC: Recent Labs  Lab 06/04/18 1428 06/05/18 0419  WBC 10.2 8.7  NEUTROABS 9.4* 7.7  HGB 11.4* 9.3*  HCT 35.3* 29.1*  MCV 89.1 87.9  PLT 358 161   Basic Metabolic Panel: Recent Labs  Lab 06/04/18 1428 06/04/18 1630 06/05/18 0419  NA 135 137 138  K 6.6* 6.5* 6.0*  CL 101 104 106  CO2 16* 18* 18*  GLUCOSE 175* 164* 203*  BUN 106* 100* 94*  CREATININE 6.85* 6.32* 5.32*  CALCIUM 9.2 8.6* 8.1*   GFR: Estimated Creatinine Clearance: 11 mL/min (A) (by C-G formula based on SCr of 5.32 mg/dL (H)). Liver Function Tests: Recent Labs  Lab 06/04/18 1428  AST 14*  ALT 10  ALKPHOS 87  BILITOT 1.0  PROT 8.1  ALBUMIN 2.4*   Recent Labs  Lab 06/04/18 1428  LIPASE 17   No results for input(s): AMMONIA in the  last 168 hours. Coagulation Profile: Recent Labs  Lab 06/04/18 1428  INR 1.41   Cardiac Enzymes: Recent Labs  Lab 06/04/18 1428  TROPONINI <0.03   BNP (last 3 results) No results for input(s): PROBNP in the last 8760 hours. HbA1C: No results for input(s): HGBA1C in the last 72 hours. CBG: No results for input(s): GLUCAP in the last 168 hours. Lipid Profile: No results for input(s): CHOL, HDL, LDLCALC, TRIG, CHOLHDL, LDLDIRECT in the last 72 hours. Thyroid Function Tests: Recent Labs    06/04/18 1428  TSH 0.694   Anemia Panel: No results for input(s): VITAMINB12, FOLATE, FERRITIN, TIBC, IRON, RETICCTPCT in the last 72 hours. Sepsis Labs: Recent Labs  Lab 06/04/18 1428 06/04/18 1450 06/04/18 1631 06/04/18 1827 06/05/18 0419  PROCALCITON 58.72  --   --   --  59.18  LATICACIDVEN  --  2.44* 1.9 1.7  --     Recent Results (from the past  240 hour(s))  Blood Culture (routine x 2)     Status: None (Preliminary result)   Collection Time: 06/04/18  2:47 PM  Result Value Ref Range Status   Specimen Description BLOOD RIGHT HAND  Final   Special Requests   Final    BOTTLES DRAWN AEROBIC ONLY Blood Culture adequate volume Performed at [facility], [address], [place], [place] [address]    Culture PENDING  Incomplete   Report Status PENDING  Incomplete  Blood Culture (routine x 2)     Status: None (Preliminary result)   Collection Time: 06/04/18  2:55 PM  Result Value Ref Range Status   Specimen Description RIGHT ANTECUBITAL  Final   Special Requests   Final    BOTTLES DRAWN AEROBIC AND ANAEROBIC Blood Culture adequate volume Performed at [facility], [address] [place], [place] [address]    Culture PENDING  Incomplete   Report Status PENDING  Incomplete  MRSA PCR Screening     Status: None   Collection Time: 06/04/18  5:52 PM  Result Value Ref Range Status   MRSA by PCR NEGATIVE NEGATIVE Final    Comment:        The GeneXpert MRSA Assay (FDA approved for  NASAL specimens only), is one component of a comprehensive MRSA colonization surveillance program. It is not intended to diagnose MRSA infection nor to guide or monitor treatment for MRSA infections. Performed at [facility], [address], [place], [place] [address]          Radiology Studies: Dg Chest [facility] 1 View  Result Date: 06/04/2018 CLINICAL DATA:  Tachycardia, upper abdomen pain and constipation for 5 days. EXAM: PORTABLE CHEST 1 VIEW COMPARISON:  June 30, 2017 FINDINGS: The mediastinal contour is normal. Heart size is enlarged. Consolidation of right lung base is identified. There is mild central pulmonary vascular congestion. There is elevation of the right hemidiaphragm unchanged. The visualized skeletal structures are stable. IMPRESSION: Right lung base pneumonia.  Central pulmonary vascular congestion. Electronically Signed   By: Abelardo Diesel M.D.   On: 06/04/2018 14:43        Scheduled Meds: . amiodarone  200 mg Oral BID  . aspirin EC  81 mg Oral Daily  . clopidogrel  75 mg Oral Q breakfast  . folic acid  1 mg Oral Daily  . multivitamin with minerals  1 tablet Oral Daily  . sodium polystyrene  30 g Oral Once  . thiamine  100 mg Oral Daily   Or  . thiamine  100 mg Intravenous Daily  . tiotropium  18 mcg Inhalation Daily   Continuous Infusions: . azithromycin Stopped (06/04/18 2348)  . cefTRIAXone (ROCEPHIN)  IV Stopped (06/04/18 1542)  . diltiazem (CARDIZEM) infusion 7.5 mg/hr (06/05/18 0647)  . sodium chloride       LOS: 1 day    Time spent: 80mins    Kathie Dike, MD [facility] Pager [phone]  If 7PM-7AM, please contact night-coverage [email or web address] Password [email or web address] 06/05/2018, 11:02 AM

## 2018-06-06 DIAGNOSIS — N390 Urinary tract infection, site not specified: Secondary | ICD-10-CM

## 2018-06-06 DIAGNOSIS — I5042 Chronic combined systolic (congestive) and diastolic (congestive) heart failure: Secondary | ICD-10-CM

## 2018-06-06 DIAGNOSIS — N183 Chronic kidney disease, stage 3 (moderate): Secondary | ICD-10-CM

## 2018-06-06 DIAGNOSIS — I4891 Unspecified atrial fibrillation: Secondary | ICD-10-CM

## 2018-06-06 DIAGNOSIS — J189 Pneumonia, unspecified organism: Secondary | ICD-10-CM

## 2018-06-06 DIAGNOSIS — A419 Sepsis, unspecified organism: Secondary | ICD-10-CM

## 2018-06-06 DIAGNOSIS — N179 Acute kidney failure, unspecified: Secondary | ICD-10-CM

## 2018-06-06 DIAGNOSIS — I471 Supraventricular tachycardia: Secondary | ICD-10-CM

## 2018-06-06 LAB — PROCALCITONIN: Procalcitonin: 35.75 ng/mL

## 2018-06-06 LAB — RENAL FUNCTION PANEL
Albumin: 1.8 g/dL — ABNORMAL LOW (ref 3.5–5.0)
Anion gap: 8 (ref 5–15)
BUN: 79 mg/dL — ABNORMAL HIGH (ref 6–20)
CO2: 21 mmol/L — ABNORMAL LOW (ref 22–32)
Calcium: 8.6 mg/dL — ABNORMAL LOW (ref 8.9–10.3)
Chloride: 107 mmol/L (ref 98–111)
Creatinine, Ser: 3.23 mg/dL — ABNORMAL HIGH (ref 0.44–1.00)
GFR calc Af Amer: 18 mL/min — ABNORMAL LOW (ref >60)
GFR calc non Af Amer: 15 mL/min — ABNORMAL LOW (ref >60)
Glucose, Bld: 180 mg/dL — ABNORMAL HIGH (ref 70–99)
Phosphorus: 3.5 mg/dL (ref 2.5–4.6)
Potassium: 4.8 mmol/L (ref 3.5–5.1)
Sodium: 136 mmol/L (ref 135–145)

## 2018-06-06 LAB — CBC
HCT: 29.3 % — ABNORMAL LOW (ref 36.0–46.0)
Hemoglobin: 9.1 g/dL — ABNORMAL LOW (ref 12.0–15.0)
MCH: 27.6 pg (ref 26.0–34.0)
MCHC: 31.1 g/dL (ref 30.0–36.0)
MCV: 88.8 fL (ref 78.0–100.0)
Platelets: 279 10*3/uL (ref 150–400)
RBC: 3.3 MIL/uL — ABNORMAL LOW (ref 3.87–5.11)
RDW: 16.3 % — ABNORMAL HIGH (ref 11.5–15.5)
WBC: 10.8 10*3/uL — ABNORMAL HIGH (ref 4.0–10.5)

## 2018-06-06 LAB — GLUCOSE, CAPILLARY
Glucose-Capillary: 149 mg/dL — ABNORMAL HIGH (ref 70–99)
Glucose-Capillary: 190 mg/dL — ABNORMAL HIGH (ref 70–99)
Glucose-Capillary: 165 mg/dL — ABNORMAL HIGH (ref 70–99)
Glucose-Capillary: 161 mg/dL — ABNORMAL HIGH (ref 70–99)
Glucose-Capillary: 133 mg/dL — ABNORMAL HIGH (ref 70–99)

## 2018-06-06 MED ORDER — CARVEDILOL 3.125 MG PO TABS
6.2500 mg | ORAL_TABLET | Freq: Two times a day (BID) | ORAL | Status: DC
  Administered 2018-06-06 – 2018-06-07 (×3): 6.25 mg via ORAL
  Filled 2018-06-06 (×3): qty 2

## 2018-06-06 MED ORDER — AMIODARONE HCL IN DEXTROSE 360-4.14 MG/200ML-% IV SOLN
60.0000 mg/h | INTRAVENOUS | Status: AC
Start: 2018-06-06 — End: 2018-06-06
  Administered 2018-06-06 (×2): 60 mg/h via INTRAVENOUS
  Filled 2018-06-06: qty 400
  Filled 2018-06-06: qty 200

## 2018-06-06 MED ORDER — INSULIN ASPART 100 UNIT/ML ~~LOC~~ SOLN: 0.0000 [IU] | Freq: Every day | SUBCUTANEOUS | Status: DC

## 2018-06-06 MED ORDER — AMIODARONE HCL IN DEXTROSE 360-4.14 MG/200ML-% IV SOLN
30.0000 mg/h | INTRAVENOUS | Status: DC
  Administered 2018-06-07 – 2018-06-08 (×5): 30 mg/h via INTRAVENOUS
  Filled 2018-06-06 (×4): qty 200

## 2018-06-06 MED ORDER — INSULIN ASPART 100 UNIT/ML ~~LOC~~ SOLN
3.0000 [IU] | Freq: Three times a day (TID) | SUBCUTANEOUS | Status: DC
  Administered 2018-06-07 – 2018-06-11 (×5): 3 [IU] via SUBCUTANEOUS

## 2018-06-06 MED ORDER — AMIODARONE LOAD VIA INFUSION
150.0000 mg | Freq: Once | INTRAVENOUS | Status: AC
  Administered 2018-06-06: 150 mg via INTRAVENOUS
  Filled 2018-06-06: qty 83.34

## 2018-06-06 MED ORDER — INSULIN ASPART 100 UNIT/ML ~~LOC~~ SOLN
0.0000 [IU] | Freq: Three times a day (TID) | SUBCUTANEOUS | Status: DC
  Administered 2018-06-06 (×2): 3 [IU] via SUBCUTANEOUS
  Administered 2018-06-07: 2 [IU] via SUBCUTANEOUS
  Administered 2018-06-07: 3 [IU] via SUBCUTANEOUS
  Administered 2018-06-09 (×3): 2 [IU] via SUBCUTANEOUS
  Administered 2018-06-10: 3 [IU] via SUBCUTANEOUS
  Administered 2018-06-10 – 2018-06-14 (×3): 2 [IU] via SUBCUTANEOUS

## 2018-06-06 MED ORDER — APIXABAN 2.5 MG PO TABS
2.5000 mg | ORAL_TABLET | Freq: Two times a day (BID) | ORAL | Status: DC
  Administered 2018-06-06 – 2018-06-08 (×6): 2.5 mg via ORAL
  Filled 2018-06-06 (×6): qty 1

## 2018-06-06 NOTE — Telephone Encounter (Signed)
I faxed it to a Homehealth agency--dont remember which one.

## 2018-06-06 NOTE — Consult Note (Addendum)
Cardiology Consultation:   Patient ID: ARDELL AARONSON; [id number]; [birth year]   Admit date: 06/04/2018 Date of Consult: 06/06/2018  Primary Care Provider: [name], MD Primary Cardiologist: Kate Sable, MD  Primary Electrophysiologist:  NA   Patient Profile:   [name] is a 52 y.o. female with a hx of NICM EF 20-25% who is being seen today for the evaluation of Atrial fib with RVR at the request of Dr. Manuella Ghazi.  History of Present Illness:   [name] is a 52 year old female patient with a nonischemic cardiomyopathy ejection fraction 20 to 25% in the past, normal coronary arteries on cath 07/2016, history of CVA on Plavix and aspirin, hypertension, CKD stage III, DM, history of polysubstance abuse with EtOH and cocaine.  Was last seen in our office 08/2017.  She was placed on amiodarone and carvedilol 07/2017 for PAT.  LVEF had improved to 30 to 35% on echo 05/2017.  Patient was admitted with sepsis secondary to UTI.  She went into A. fib with RVR yesterday.  She is still on amiodarone but carvedilol was discontinued because of low blood pressures.  Heart rate is still fast on IV diltiazem. Crt 3.23 today, on admission Crt 6.85 and K 6.6 which has normalized. Troponin negative. CHADSVASC=6(female, HTN, CHF, CVA, DM).  Patient states she no longer does any substance abuse.  She lives alone in apartment and gets around with a walker.  She is disabled from her stroke.  Denies any chest pain, palpitations, dyspnea, dyspnea on exertion.  She does not feel her heart racing.  She is just very weak.  Past Medical History:  Diagnosis Date  . Alcohol use   . Ankle fracture, lateral malleolus, closed 12/30/2011  . Anxiety   . Breast mass, left 12/15/2011  . Chronic anemia   . Chronic combined systolic and diastolic CHF (congestive heart failure) ([place])   . CKD (chronic kidney disease), stage III ([place])    pt denies  . Cocaine abuse ([place])   . COPD (chronic obstructive pulmonary  disease) ([place])   . Diabetes mellitus, type 2 ([place])   . Diabetic Charcot foot ([place]) 12/30/2011  . Essential hypertension   . History of GI bleed 12/05/2009   Qualifier: Diagnosis of  By: Zeb Comfort    . Hyperlipidemia   . Hypertensive cardiomyopathy ([place]) 11/08/2012   a. 05/2016: echo showing EF of 20-25% with normal cors by cath in 07/2016.  Marland Kitchen Noncompliance with medication regimen   . Obesity   . Panic attacks   . PAT (paroxysmal atrial tachycardia) ([place])    a. started on amiodarone 07/2017 for this.  . Sleep apnea    does not use cpap  . Stroke ([place])    06/24/17  . Tobacco abuse   . Urinary incontinence     Past Surgical History:  Procedure Laterality Date  . BREAST BIOPSY    . CARDIAC CATHETERIZATION N/A 07/28/2016   Procedure: Left Heart Cath and Coronary Angiography;  Surgeon: Jettie Booze, MD;  Location: [facility];  Service: Cardiovascular;  Laterality: N/A;  . DILATION AND CURETTAGE OF UTERUS    . I&D EXTREMITY Bilateral 09/22/2017   Procedure: BILATERAL DEBRIDEMENT LEG/FOOT ULCERS, APPLY VERAFLO WOUND VAC;  Surgeon: Newt Minion, MD;  Location: [facility];  Service: Orthopedics;  Laterality: Bilateral;  . SKIN SPLIT GRAFT Bilateral 09/28/2017   Procedure: BILATERAL SPLIT THICKNESS SKIN GRAFT LEGS/FEET AND APPLY VAC;  Surgeon: Newt Minion, MD;  Location: [facility];  Service: Orthopedics;  Laterality: Bilateral;     Home Medications:  Prior to Admission medications   Medication Sig Start Date End Date Taking? Authorizing Provider  ACCU-CHEK FASTCLIX LANCETS MISC Test up to 4 times daily DX e11.65 09/07/17   [name], MD  albuterol [facility] 121 HFA) 108 (90 Base) MCG/ACT inhaler INHALE 2 PUFFS EVERY 6 HOURS AS NEEDED FOR SHORTNESS OF BREATH/WHEEZING. 02/09/18   [name], MD  amiodarone (PACERONE) 200 MG tablet Take 1 tablet (200 mg total) by mouth 2 (two) times daily. 08/14/17   Oswald Hillock, MD  amLODipine (NORVASC) 2.5 MG tablet Take 1 tablet  (2.5 mg total) by mouth daily. 05/26/18   [name], MD  aspirin EC 81 MG EC tablet Take 1 tablet (81 mg total) by mouth daily. 07/01/17   Mikhail, Velta Addison, DO  Azelastine HCl (ASTEPRO) 0.15 % SOLN Place 1 spray into the nose every 12 (twelve) hours as needed (for allergies).     [provider]  Blood Glucose Monitoring Suppl (ACCU-CHEK AVIVA PLUS) w/Device KIT Four times daily testing dx E11.65 09/07/17   [name], MD  carvedilol (COREG) 25 MG tablet Take 1 tablet (25 mg total) by mouth 2 (two) times daily with a meal. 08/06/17   Oswald Hillock, MD  clopidogrel (PLAVIX) 75 MG tablet TAKE 1 TABLET BY MOUTH DAILY WITH BREAKFAST. 05/27/18   [name], MD  Cranberry 400 MG TABS Take 1 tablet by mouth daily.    [provider]  ezetimibe (ZETIA) 10 MG tablet TAKE 1 TABLET BY MOUTH ONCE A DAY. 05/27/18   [name], MD  FLUoxetine (PROZAC) 20 MG capsule TAKE 1 CAPSULE BY MOUTH EVERY DAY. 05/27/18   [name], MD  furosemide (LASIX) 20 MG tablet TAKE 1 TABLET BY MOUTH ONCE A DAY. 05/27/18   [name], MD  gabapentin (NEURONTIN) 100 MG capsule TAKE 1 CAPSULE BY MOUTH THREE TIMES A DAY. 05/27/18   [name], MD  gabapentin (NEURONTIN) 300 MG capsule Take 1 capsule (300 mg total) by mouth 3 (three) times daily. 3 times a day when necessary neuropathy pain 05/30/18   Newt Minion, MD  glucose blood (ACCU-CHEK AVIVA) test strip Use as instructed four times daily dx: E11.65 09/07/17   [name], MD  hydrALAZINE (APRESOLINE) 25 MG tablet Take 25 mg 3 (three) times daily by mouth.    [provider]  insulin detemir (LEVEMIR) 100 UNIT/ML injection Inject 0.08 mLs (8 Units total) into the skin at bedtime. 01/14/18   [name], MD  ipratropium-albuterol (DUONEB) 0.5-2.5 (3) MG/3ML SOLN Take 3 mLs by nebulization every 6 (six) hours as needed (wheezes or SOB).     [provider]  linagliptin (TRADJENTA) 5  MG TABS tablet Take 1 tablet (5 mg total) by mouth daily. 02/17/18   [name], MD  meclizine (ANTIVERT) 25 MG tablet Take 1 tablet (25 mg total) by mouth 3 (three) times daily as needed for dizziness. 09/14/17   Long, Wonda Olds, MD  montelukast (SINGULAIR) 10 MG tablet TAKE (1) TABLET BY MOUTH AT BEDTIME. 05/27/18   [name], MD  Multiple Vitamin (MULTIVITAMIN WITH MINERALS) TABS tablet Take 1 tablet by mouth daily. 07/01/17   Mikhail, Velta Addison, DO  olmesartan (BENICAR) 40 MG tablet Take 1 tablet (40 mg total) by mouth daily. 05/05/18   [name], MD  pantoprazole (PROTONIX) 40 MG tablet Take 1 tablet (40  mg total) by mouth daily. 02/09/18   [name], MD  potassium chloride SA (K-DUR,KLOR-CON) 20 MEQ tablet TAKE 1&1/2 TABLET BY MOUTH DAILY. 02/09/18   [name], MD  potassium chloride SA (K-DUR,KLOR-CON) 20 MEQ tablet TAKE 1&1/2 TABLET BY MOUTH DAILY. 05/27/18   [name], MD  rosuvastatin (CRESTOR) 10 MG tablet TAKE 1 TABLET BY MOUTH ONCE A DAY. 05/30/18   [name], MD  silver sulfADIAZINE (SILVADENE) 1 % cream Apply 1 application topically daily. 09/20/17   Newt Minion, MD  thiamine 100 MG tablet Take 1 tablet (100 mg total) by mouth daily. 07/01/17   Mikhail, Velta Addison, DO  tiotropium (SPIRIVA HANDIHALER) 18 MCG inhalation capsule Place 18 mcg into inhaler and inhale daily. 2 puffs    [provider]  traMADol (ULTRAM) 50 MG tablet One tablet at bedtime for bilateral foot pain 05/05/18   [name], MD  traZODone (DESYREL) 50 MG tablet TAKE (1) TABLET BY MOUTH AT BEDTIME. 02/09/18   [name], MD  traZODone (DESYREL) 50 MG tablet TAKE (1) TABLET BY MOUTH AT BEDTIME. 05/27/18   [name], MD  UNABLE TO [place] chair x 1 02/17/18   [name], MD  UNABLE TO FIND Incontinence pullups and supplies Use daily as needed DX: urinary incontinence 02/17/18   [name], MD  Vitamin D,  Ergocalciferol, (DRISDOL) 50000 units CAPS capsule Take 1 capsule (50,000 Units total) by mouth once a week. 03/02/17   [name], MD    Inpatient Medications: Scheduled Meds: . amiodarone  200 mg Oral BID  . aspirin EC  81 mg Oral Daily  . clopidogrel  75 mg Oral Q breakfast  . folic acid  1 mg Oral Daily  . heparin injection (subcutaneous)  5,000 Units Subcutaneous Q8H  . insulin aspart  0-15 Units Subcutaneous TID WC  . insulin aspart  0-5 Units Subcutaneous QHS  . insulin aspart  3 Units Subcutaneous TID WC  . insulin detemir  10 Units Subcutaneous QHS  . multivitamin with minerals  1 tablet Oral Daily  . thiamine  100 mg Oral Daily   Or  . thiamine  100 mg Intravenous Daily  . tiotropium  18 mcg Inhalation Daily   Continuous Infusions: . azithromycin Stopped (06/05/18 1955)  . cefTRIAXone (ROCEPHIN)  IV Stopped (06/05/18 1525)  . diltiazem (CARDIZEM) infusion 15 mg/hr (06/06/18 0752)  . sodium chloride     PRN Meds: ipratropium-albuterol, LORazepam **OR** LORazepam, traMADol  Allergies:   No Known Allergies  Social History:   Social History   Socioeconomic History  . Marital status: Widowed    Spouse name: Not on file  . Number of children: 2  . Years of education: Not on file  . Highest education level: Not on file  Occupational History  . Not on file  Social Needs  . Financial resource strain: Not hard at all  . Food insecurity:    Worry: Never true    Inability: Never true  . Transportation needs:    Medical: No    Non-medical: No  Tobacco Use  . Smoking status: Former Smoker    Packs/day: 1.00    Years: 20.00    Pack years: 20.00    Types: Cigarettes    Last attempt to quit: 06/03/2017    Years since quitting: 1.0  . Smokeless tobacco: Never Used  Substance and Sexual Activity  . Alcohol use: No    Alcohol/week:  0.0 standard drinks    Comment: Quit in 2017  . Drug use: Not Currently    Frequency: 3.0 times per week    Types: Marijuana,  Cocaine    Comment: none since August 2018  . Sexual activity: Not Currently    Birth control/protection: None  Lifestyle  . Physical activity:    Days per week: 7 days    Minutes per session: 60 min  . Stress: Not at all  Relationships  . Social connections:    Talks on phone: More than three times a week    Gets together: More than three times a week    Attends religious service: More than 4 times per year    Active member of club or organization: Yes    Attends meetings of clubs or organizations: More than 4 times per year    Relationship status: Widowed  . Intimate partner violence:    Fear of current or ex partner: No    Emotionally abused: No    Physically abused: No    Forced sexual activity: No  Other Topics Concern  . Not on file  Social History Narrative   Now has an apartment and lives with her dog. Is going to church and stopped all the "bad" stuff. Is "living for the [place]". Doing much better. Has a great outlook on life.Is much happier now.     Family History:    Family History  Problem Relation Age of Onset  . Hypertension Mother   . Heart attack Mother   . Hypertension Father        CABG   . Hypertension Sister   . Hypertension Brother   . Hypertension Sister   . Cancer Sister        breast   . Arthritis Unknown   . Cancer Unknown   . Diabetes Unknown   . Asthma Unknown   . Hypertension Daughter   . Hypertension Son      ROS:  Please see the history of present illness.  Review of Systems  Constitution: Positive for malaise/fatigue.  HENT: Negative.   Eyes: Negative.   Cardiovascular: Negative.   Respiratory: Negative.   Hematologic/Lymphatic: Negative.   Musculoskeletal: Positive for muscle weakness. Negative for joint pain.  Gastrointestinal: Positive for abdominal pain.  Genitourinary: Negative.   Neurological: Positive for difficulty with concentration, loss of balance and weakness.    All other ROS reviewed and negative.     Physical  Exam/Data:   Vitals:   06/06/18 0200 06/06/18 0400 06/06/18 0500 06/06/18 0757  BP: 93/71     Pulse: 98     Resp: 14     Temp:  98.5 F (36.9 C)    TempSrc:  Oral    SpO2: 99%   99%  Weight:   73.5 kg   Height:        Intake/Output Summary (Last 24 hours) at 06/06/2018 0842 Last data filed at 06/06/2018 0500 Gross per 24 hour  Intake 16.67 ml  Output 950 ml  Net -933.33 ml   Filed Weights   06/04/18 1752 06/05/18 0519 06/06/18 0500  Weight: 70.7 kg 72.1 kg 73.5 kg   Body mass index is 30.62 kg/m.  General:  Well nourished, well developed, in no acute distress HEENT: normal Lymph: no adenopathy Neck: Slight increase in JVD Endocrine:  No thryomegaly Vascular: No carotid bruits; FA pulses 2+ bilaterally without bruits  Cardiac:  normal S1, S2; irregular irregular with 1/6 systolic murmur at the  left sternal border Lungs: Decreased breath sounds with crackles at the bases Abd: soft, nontender, no hepatomegaly  Ext: no edema Musculoskeletal:  No deformities, BUE and BLE strength normal and equal Skin: warm and dry  Neuro:  CNs 2-12 intact, no focal abnormalities noted Psych:  Normal affect   EKG:  The EKG was personally reviewed and demonstrates: Atrial fibrillation at 160 bpm Telemetry:  Telemetry was personally reviewed and demonstrates: Atrial fibrillation at 130  Relevant CV Studies: Echocardiogram 06/22/2017 Study Conclusions   - Left ventricle: The cavity size was normal. Wall thickness was   increased in a pattern of moderate LVH. Systolic function was   moderately to severely reduced. The estimated ejection fraction   was in the range of 30% to 35%. Diffuse hypokinesis. Features are   consistent with a pseudonormal left ventricular filling pattern,   with concomitant abnormal relaxation and increased filling   pressure (grade 2 diastolic dysfunction). - Mitral valve: There was moderate regurgitation directed   posteriorly. - Left atrium: The atrium was  mildly dilated.   Cardiac catheterization 0626  LV end diastolic pressure is mildly elevated.  There is no aortic valve stenosis.  No angiographically apparent coronary artery disease.  Tortuous right subclavian requiring Glide wire. Would not use right radial approach if cath was needed in the future.   Continue aggressive medical therapy and blood pressure control.        Laboratory Data:  Chemistry Recent Labs  Lab 06/04/18 1630 Jun 12, 2018 0419 06/06/18 0429  NA 137 138 136  K 6.5* 6.0* 4.8  CL 104 106 107  CO2 18* 18* 21*  GLUCOSE 164* 203* 180*  BUN 100* 94* 79*  CREATININE 6.32* 5.32* 3.23*  CALCIUM 8.6* 8.1* 8.6*  GFRNONAA 7* 8* 15*  GFRAA 8* 10* 18*  ANIONGAP '15 14 8    ' Recent Labs  Lab 06/04/18 1428 06/06/18 0429  PROT 8.1  --   ALBUMIN 2.4* 1.8*  AST 14*  --   ALT 10  --   ALKPHOS 87  --   BILITOT 1.0  --    Hematology Recent Labs  Lab 06/04/18 1428 06-12-18 0419 06/06/18 0429  WBC 10.2 8.7 10.8*  RBC 3.96 3.31* 3.30*  HGB 11.4* 9.3* 9.1*  HCT 35.3* 29.1* 29.3*  MCV 89.1 87.9 88.8  MCH 28.8 28.1 27.6  MCHC 32.3 32.0 31.1  RDW 16.0* 16.1* 16.3*  PLT 358 297 279   Cardiac Enzymes Recent Labs  Lab 06/04/18 1428  TROPONINI <0.03   No results for input(s): TROPIPOC in the last 168 hours.  BNPNo results for input(s): BNP, PROBNP in the last 168 hours.  DDimer No results for input(s): DDIMER in the last 168 hours.  Radiology/Studies:  US Renal  Result Date: June 12, 2018 CLINICAL DATA:  Acute kidney injury EXAM: RENAL / URINARY TRACT ULTRASOUND COMPLETE COMPARISON:  None. FINDINGS: Right Kidney: Length: 9.3 cm.  Echogenic renal parenchyma.  No hydronephrosis. Left Kidney: Length: 9.9 cm.  Echogenic renal parenchyma.  No hydronephrosis. Bladder: Not discretely visualized. IMPRESSION: Echogenic renal parenchyma bilaterally, suggesting medical renal disease. No hydronephrosis. Electronically Signed   By: Julian Hy M.D.   On: Jun 12, 2018  12:44   Dg Chest Port 1 View  Result Date: 2018-06-12 CLINICAL DATA:  Shortness of breath, generalized weakness EXAM: PORTABLE CHEST 1 VIEW COMPARISON:  06/04/2018 FINDINGS: Low lung volumes. Cardiomegaly with pulmonary vascular congestion and possible mild perihilar edema. Mild patchy bilateral lower lobe opacities, likely atelectasis. No definite pleural effusions. No  pneumothorax. IMPRESSION: Cardiomegaly with possible mild perihilar edema. Mild patchy bilateral lower lobe opacities, likely atelectasis. Electronically Signed   By: Julian Hy M.D.   On: 06/05/2018 12:43   Dg Chest Port 1 View  Result Date: 06/04/2018 CLINICAL DATA:  Tachycardia, upper abdomen pain and constipation for 5 days. EXAM: PORTABLE CHEST 1 VIEW COMPARISON:  June 30, 2017 FINDINGS: The mediastinal contour is normal. Heart size is enlarged. Consolidation of right lung base is identified. There is mild central pulmonary vascular congestion. There is elevation of the right hemidiaphragm unchanged. The visualized skeletal structures are stable. IMPRESSION: Right lung base pneumonia.  Central pulmonary vascular congestion. Electronically Signed   By: Abelardo Diesel M.D.   On: 06/04/2018 14:43    Assessment and Plan:   1. Atrial fibrillation with RVR on amiodarone and IV diltiazem at 15/h.  Previously on Coreg as an outpatient but held because of soft blood pressures.  Prior history of PAT but not atrial fibrillation.  CHA2DS2-VASc equals 6.  Will need anticoagulated.  On Plavix and aspirin for CVA.  Did have acute renal failure on admission but creatinine coming down to 3.23 today.  Was on Coreg 25 mg twice daily prior to admission.  We will add low-dose Coreg and try to back off on diltiazem drip 2. Nonischemic cardiomyopathy ejection fraction 30 to 35% 05/2017.  No heart failure on exam.  Normal coronary arteries on cath in 2017 3. Hypertension, but now hypotensive-was on hydralazine 25 mg 3 times daily, Lasix 20,  amlodipine 2.5 4. Acute renal failure improving 5. Sepsis secondary to UTI and question of pneumonia 6. COPD quit smoking 7. History of substance abuse has quit 8. Prior CVA on Plavix and aspirin   For questions or updates, please contact [place] Please consult [email or web address] for contact info under Cardiology/STEMI.   Signed, Ermalinda Barrios, PA-C  06/06/2018 8:42 AM   The patient was seen and examined, and I agree with the history, physical exam, assessment and plan as documented above, with modifications as noted below. I have also personally reviewed all relevant documentation, old records, labs, and both radiographic and cardiovascular studies. I have also independently interpreted old and new ECG's.  52 yr old woman whom I last evaluated in 06/2016 and has been seen by several of my colleagues since that time. She has a nonischemic (presumed hypertensive) cardiomyopathy, LVEF 30-35% in 05/2017, CKD stage III, paroxysmal atrial tachycardia, COPD, and HTN admitted with urosepsis and acute on chronic renal failure, possibly pneumonia, and found to be in rapid atrial fibrillation. When she was seen by Melina Copa PA-C it was reinforced she should be evaluated by EP but it does not appear this ever occurred. She has been maintained on amiodarone and was instructed to take 200 mg daily eventually, but it appears she remains on 200 mg bid. Last event monitor in 08/2017 showed no arrhythmias.  Currently on ASA and Plavix.  While hospitalized she has been administered IV diltiazem. Carvedilol was withheld due to hypotension (takes 25 mg bid at home). She has also been given IV fluids as she was prerenal due to volume depletion and was hyperkalemic.  While she has generalized weakness, she denies shortness of breath and palpitations. She does talk about an urge to cough.  Recommendations: Will need to wean off diltiazem altogether and attempt to advance carvedilol given its negative inotropic  effects. As she has also been receiving IV fluids, this puts her at risk for the development of decompensated heart  failure.  Rapid atrial fibrillation likely triggered by urosepsis, acute on chronic renal failure, and possibly pneumonia. She has been on amiodarone 200 mg bid for some time and this will need to be reduced to 200 mg daily at the time of discharge. Can continue higher dose for now for attempts at rate control with Coreg. She also requires anticoagulation but has acute on chronic renal failure and while creatinine is trending down, it is 3.23 today. I will stop Plavix and institute apixaban 2.5 mg bid. GFR 18 ml/min today. Hgb 9.1 likely from chronic disease. This will also need further monitoring.   Kate Sable, MD, [facility]  06/06/2018 9:36 AM

## 2018-06-06 NOTE — Care Management Important Message (Signed)
Important Message  Patient Details  Name: [name] MRN: [id number] Date of Birth: 09/30/1966   Medicare Important Message Given:  Yes    Shelda Altes 06/06/2018, 11:26 AM

## 2018-06-06 NOTE — Progress Notes (Signed)
PROGRESS NOTE    [name]  PJA:[id number] DOB: 1966/06/14 DOA: 06/04/2018 PCP: [name], MD   Brief Narrative:   52 year old female with a history of COPD, combined CHF, chronic kidney disease stage III, paroxysmal atrial tachycardia, presents to the hospital with several days of feeling unwell and generally weak.  She has had some abdominal cramping, but no vomiting.  She had noted decreased urine output.  She was noted to be hypotensive and tachycardic on arrival.  She was found to have acute kidney injury and chronic kidney disease with a creatinine of 7 and a potassium of 6.6.  She also had evidence of UTI and possible pneumonia.  She is been started on intravenous antibiotics as well as IV fluids.  Nephrology is following for renal failure and her creatinine is slowly improving.  She is being hydrated.  She is been started on a Cardizem infusion for atrial tachyarrhythmias.  Unfortunately, atrial tachyarrhythmias continue to persist and patient remains weak.  Will consult cardiology today for further assistance in management.  Assessment & Plan:   Principal Problem:   Sepsis ([place]) Active Problems:   Alcohol abuse   Medical non-compliance   CAP (community acquired pneumonia)   CKD (chronic kidney disease) stage 3, GFR 30-59 ml/min (HCC)   Polysubstance abuse (including cocaine)   Hyperkalemia   Chronic pain syndrome   Chronic combined systolic and diastolic CHF (congestive heart failure) ([place])   DM (diabetes mellitus), type 2, uncontrolled, with renal complications (HCC)   COPD (chronic obstructive pulmonary disease) ([place])   AKI (acute kidney injury) ([place])   Acute lower UTI   A-fib ([place])   1. Sepsis-persistent likely secondary to E. coli UTI.  Continue azithromycin and Rocephin for now.  Chest x-ray reviewed from 8/11 that appears to reveal more atelectasis than pneumonia at this time.  No symptoms consistent with pneumonia at this time.  Await further  sensitivities. 2. Persistent atrial tachyarrhythmia-worsening.  Patient has history of paroxysmal atrial tachycardia and Coreg has been withheld due to soft blood pressure readings.  Continued on amiodarone.  Unfortunately, this remains persistent and Cardizem infusion has been increased this morning.  LVEF noted to be 30 to 35% in the past.  Will consult cardiology for assistance in management.  Possible need for anticoagulation, patient is currently on aspirin and Plavix. 3. Acute kidney injury on chronic kidney disease stage III-improving.  Likely prerenal due to volume depletion.  She is receiving IV fluids.  ARB has been held.  Diuretics currently on hold.  Nephrology following.    She has had improving renal function. 4. Hyperkalemia-resolved.  Related to acute kidney injury.    Received Kayexalate yesterday. 5. Acute respiratory failure with hypoxia.  Currently on 2.5 L.  Continue to wean as tolerated.  Will likely need to get heart rate better controlled first. 6. Chronic combined CHF-stable.  Ejection fraction of 30 to 35%.  Will need to monitor volume status closely since she is being hydrated. 7. COPD.  No evidence of wheezing at this time.  Continue bronchodilators as needed. 8. History of cerebral infarction.  Continue on aspirin and Plavix. 9. Insulin-dependent diabetes.  Linagliptin currently on hold.  Continue on Levemir.    Increase sliding scale insulin to moderate as diet is being advanced to soft today.   DVT prophylaxis:Heparin Code Status: Full Family Communication: Son on phone Disposition Plan: Cardiology consultation for atrial fibrillation with poor control. Continue IV antibiotics. Appreciate ongoing Renal evaluation.   Consultants:  Nephrology  Cardiology  Procedures:   None  Antimicrobials:   Rocephin and Azithromycin 8/10->   Subjective: Patient seen and evaluated today with no new acute complaints or concerns. No acute concerns or events noted  overnight.  She continues to have weakness and has had to have her Cardizem IV drip rate increased this morning due to her elevated heart rate which has remained persistent.  Objective: Vitals:   06/06/18 0100 06/06/18 0200 06/06/18 0400 06/06/18 0500  BP: 101/71 93/71    Pulse: 99 98    Resp: 17 14    Temp:   98.5 F (36.9 C)   TempSrc:   Oral   SpO2: 99% 99%    Weight:    73.5 kg  Height:        Intake/Output Summary (Last 24 hours) at 06/06/2018 0751 Last data filed at 06/06/2018 0500 Gross per 24 hour  Intake 16.67 ml  Output 950 ml  Net -933.33 ml   Filed Weights   06/04/18 1752 06/05/18 0519 06/06/18 0500  Weight: 70.7 kg 72.1 kg 73.5 kg    Examination:  General exam: Appears calm and comfortable  Respiratory system: Clear to auscultation. Respiratory effort normal. On 2L [place]. Cardiovascular system: S1 & S2 heard, irregular with tachycardia. No JVD, murmurs, rubs, gallops or clicks. No pedal edema. Gastrointestinal system: Abdomen is nondistended, soft and nontender. No organomegaly or masses felt. Normal bowel sounds heard. Central nervous system: Alert and oriented. No focal neurological deficits. Extremities: Symmetric 5 x 5 power. Skin: No rashes, lesions or ulcers Psychiatry: Judgement and insight appear normal. Mood & affect appropriate.     Data Reviewed: I have personally reviewed following labs and imaging studies  CBC: Recent Labs  Lab 06/04/18 1428 06/05/18 0419 06/06/18 0429  WBC 10.2 8.7 10.8*  NEUTROABS 9.4* 7.7  --   HGB 11.4* 9.3* 9.1*  HCT 35.3* 29.1* 29.3*  MCV 89.1 87.9 88.8  PLT 358 297 672   Basic Metabolic Panel: Recent Labs  Lab 06/04/18 1428 06/04/18 1630 06/05/18 0419 06/06/18 0429  NA 135 137 138 136  K 6.6* 6.5* 6.0* 4.8  CL 101 104 106 107  CO2 16* 18* 18* 21*  GLUCOSE 175* 164* 203* 180*  BUN 106* 100* 94* 79*  CREATININE 6.85* 6.32* 5.32* 3.23*  CALCIUM 9.2 8.6* 8.1* 8.6*  PHOS  --   --   --  3.5    GFR: Estimated Creatinine Clearance: 18.3 mL/min (A) (by C-G formula based on SCr of 3.23 mg/dL (H)). Liver Function Tests: Recent Labs  Lab 06/04/18 1428 06/06/18 0429  AST 14*  --   ALT 10  --   ALKPHOS 87  --   BILITOT 1.0  --   PROT 8.1  --   ALBUMIN 2.4* 1.8*   Recent Labs  Lab 06/04/18 1428  LIPASE 17   No results for input(s): AMMONIA in the last 168 hours. Coagulation Profile: Recent Labs  Lab 06/04/18 1428  INR 1.41   Cardiac Enzymes: Recent Labs  Lab 06/04/18 1428  TROPONINI <0.03   BNP (last 3 results) No results for input(s): PROBNP in the last 8760 hours. HbA1C: No results for input(s): HGBA1C in the last 72 hours. CBG: Recent Labs  Lab 06/05/18 1729 06/05/18 2059  GLUCAP 300* 222*   Lipid Profile: No results for input(s): CHOL, HDL, LDLCALC, TRIG, CHOLHDL, LDLDIRECT in the last 72 hours. Thyroid Function Tests: Recent Labs    06/04/18 1428  TSH 0.694   Anemia  Panel: No results for input(s): VITAMINB12, FOLATE, FERRITIN, TIBC, IRON, RETICCTPCT in the last 72 hours. Sepsis Labs: Recent Labs  Lab 06/04/18 1428 06/04/18 1450 06/04/18 1631 06/04/18 1827 06/05/18 0419 06/06/18 0429  PROCALCITON 58.72  --   --   --  59.18 35.75  LATICACIDVEN  --  2.44* 1.9 1.7  --   --     Recent Results (from the past 240 hour(s))  Urine Culture     Status: Abnormal (Preliminary result)   Collection Time: 06/04/18  2:07 PM  Result Value Ref Range Status   Specimen Description   Final    URINE, CLEAN CATCH Performed at [facility], [address] [place], [place] [address]    Special Requests   Final    NONE Performed at [facility], [address], [place], [place] [address]    Culture (A)  Final    >=100,000 COLONIES/mL ESCHERICHIA COLI SUSCEPTIBILITIES TO FOLLOW Performed at [facility], [place] [address] [place], [place] [address]    Report Status PENDING  Incomplete  Blood Culture (routine x 2)     Status: None (Preliminary  result)   Collection Time: 06/04/18  2:47 PM  Result Value Ref Range Status   Specimen Description BLOOD RIGHT HAND  Final   Special Requests   Final    BOTTLES DRAWN AEROBIC ONLY Blood Culture adequate volume   Culture   Final    NO GROWTH 2 DAYS Performed at [facility], [address], [place], [place] [address]    Report Status PENDING  Incomplete  Blood Culture (routine x 2)     Status: None (Preliminary result)   Collection Time: 06/04/18  2:55 PM  Result Value Ref Range Status   Specimen Description RIGHT ANTECUBITAL  Final   Special Requests   Final    BOTTLES DRAWN AEROBIC AND ANAEROBIC Blood Culture adequate volume   Culture   Final    NO GROWTH 2 DAYS Performed at [facility], [address], [place], [place] [address]    Report Status PENDING  Incomplete  MRSA PCR Screening     Status: None   Collection Time: 06/04/18  5:52 PM  Result Value Ref Range Status   MRSA by PCR NEGATIVE NEGATIVE Final    Comment:        The GeneXpert MRSA Assay (FDA approved for NASAL specimens only), is one component of a comprehensive MRSA colonization surveillance program. It is not intended to diagnose MRSA infection nor to guide or monitor treatment for MRSA infections. Performed at [facility], [address], [place], [place] [address]          Radiology Studies: US Renal  Result Date: 06/05/2018 CLINICAL DATA:  Acute kidney injury EXAM: RENAL / URINARY TRACT ULTRASOUND COMPLETE COMPARISON:  None. FINDINGS: Right Kidney: Length: 9.3 cm.  Echogenic renal parenchyma.  No hydronephrosis. Left Kidney: Length: 9.9 cm.  Echogenic renal parenchyma.  No hydronephrosis. Bladder: Not discretely visualized. IMPRESSION: Echogenic renal parenchyma bilaterally, suggesting medical renal disease. No hydronephrosis. Electronically Signed   By: Julian Hy M.D.   On: 06/05/2018 12:44   Dg Chest Port 1 View  Result Date: 06/05/2018 CLINICAL DATA:  Shortness of breath, generalized  weakness EXAM: PORTABLE CHEST 1 VIEW COMPARISON:  06/04/2018 FINDINGS: Low lung volumes. Cardiomegaly with pulmonary vascular congestion and possible mild perihilar edema. Mild patchy bilateral lower lobe opacities, likely atelectasis. No definite pleural effusions. No pneumothorax. IMPRESSION: Cardiomegaly with possible mild perihilar edema. Mild patchy  bilateral lower lobe opacities, likely atelectasis. Electronically Signed   By: Julian Hy M.D.   On: 06/05/2018 12:43   Dg Chest Port 1 View  Result Date: 06/04/2018 CLINICAL DATA:  Tachycardia, upper abdomen pain and constipation for 5 days. EXAM: PORTABLE CHEST 1 VIEW COMPARISON:  June 30, 2017 FINDINGS: The mediastinal contour is normal. Heart size is enlarged. Consolidation of right lung base is identified. There is mild central pulmonary vascular congestion. There is elevation of the right hemidiaphragm unchanged. The visualized skeletal structures are stable. IMPRESSION: Right lung base pneumonia.  Central pulmonary vascular congestion. Electronically Signed   By: Abelardo Diesel M.D.   On: 06/04/2018 14:43        Scheduled Meds: . amiodarone  200 mg Oral BID  . aspirin EC  81 mg Oral Daily  . clopidogrel  75 mg Oral Q breakfast  . folic acid  1 mg Oral Daily  . heparin injection (subcutaneous)  5,000 Units Subcutaneous Q8H  . insulin aspart  0-5 Units Subcutaneous QHS  . insulin aspart  0-9 Units Subcutaneous TID WC  . insulin detemir  10 Units Subcutaneous QHS  . multivitamin with minerals  1 tablet Oral Daily  . thiamine  100 mg Oral Daily   Or  . thiamine  100 mg Intravenous Daily  . tiotropium  18 mcg Inhalation Daily   Continuous Infusions: . azithromycin Stopped (06/05/18 1955)  . cefTRIAXone (ROCEPHIN)  IV Stopped (06/05/18 1525)  . diltiazem (CARDIZEM) infusion 10 mg/hr (06/06/18 0610)  . sodium chloride       LOS: 2 days    Time spent: 30 minutes     Darleen Crocker, DO [facility] Pager  [phone]  If 7PM-7AM, please contact night-coverage [email or web address] Password [facility] 06/06/2018, 7:51 AM

## 2018-06-06 NOTE — Telephone Encounter (Signed)
She was calling to check the status of it. I didn't know what to tell her

## 2018-06-06 NOTE — Progress Notes (Signed)
[name]  MRN: [id number]  DOB/AGE: 54-Nov-1967 54 y.o.  Primary Care Physician:Simpson, Norwood Levo, MD  Admit date: 06/04/2018  Chief Complaint:  Chief Complaint  Patient presents with  . Constipation    S-Pt presented on  06/04/2018 with  Chief Complaint  Patient presents with  . Constipation  .    Pt today feels better  Meds . amiodarone  200 mg Oral BID  . aspirin EC  81 mg Oral Daily  . carvedilol  6.25 mg Oral BID WC  . clopidogrel  75 mg Oral Q breakfast  . folic acid  1 mg Oral Daily  . heparin injection (subcutaneous)  5,000 Units Subcutaneous Q8H  . insulin aspart  0-15 Units Subcutaneous TID WC  . insulin aspart  0-5 Units Subcutaneous QHS  . insulin aspart  3 Units Subcutaneous TID WC  . insulin detemir  10 Units Subcutaneous QHS  . multivitamin with minerals  1 tablet Oral Daily  . thiamine  100 mg Oral Daily   Or  . thiamine  100 mg Intravenous Daily  . tiotropium  18 mcg Inhalation Daily       Physical Exam: Vital signs in last 24 hours: Temp:  [97.4 F (36.3 C)-99 F (37.2 C)] 98.1 F (36.7 C) (08/12 0805) Pulse Rate:  [89-119] 105 (08/12 0915) Resp:  [14-24] 18 (08/12 0915) BP: (92-113)/(68-83) 93/76 (08/12 0915) SpO2:  [96 %-99 %] 97 % (08/12 0915) Weight:  [73.5 kg] 73.5 kg (08/12 0500) Weight change: 2.8 kg Last BM Date: 06/05/18  Intake/Output from previous day: 08/11 0701 - 08/12 0700 In: 16.7 [IV Piggyback:16.7] Out: 950 [Urine:950] Total I/O In: 34 [I.V.:34] Out: 1 [Stool:1]   Physical Exam: General- pt is awake,alert, oriented to time place and person Resp- No acute REsp distress,  Rhonchi+ CVS- S1S2 irregular in rate and rhythm GIT- BS+, soft, NT, ND EXT- NO LE Edema, Cyanosis   Lab Results: CBC Recent Labs    06/05/18 0419 06/06/18 0429  WBC 8.7 10.8*  HGB 9.3* 9.1*  HCT 29.1* 29.3*  PLT 297 279    BMET Recent Labs    06/05/18 0419 06/06/18 0429  NA 138 136  K 6.0* 4.8  CL 106 107  CO2 18* 21*   GLUCOSE 203* 180*  BUN 94* 79*  CREATININE 5.32* 3.23*  CALCIUM 8.1* 8.6*   Creat trend 2019 6.85=>3.23    1.6 Baseline before admission 2018 1.4--2.5 2017 1.4--1.7 2015 1.3--3.4 2014 0.8--1.6 2012 0.6--3.0 2008 1.23  MICRO Recent Results (from the past 240 hour(s))  Urine Culture     Status: Abnormal (Preliminary result)   Collection Time: 06/04/18  2:07 PM  Result Value Ref Range Status   Specimen Description   Final    URINE, CLEAN CATCH Performed at [facility], [address] [place], [place] [address]    Special Requests   Final    NONE Performed at [facility], [address], [place], [place] [address]    Culture (A)  Final    >=100,000 COLONIES/mL ESCHERICHIA COLI SUSCEPTIBILITIES TO FOLLOW Performed at [facility], [place] [address] [place], [place] [address]    Report Status PENDING  Incomplete  Blood Culture (routine x 2)     Status: None (Preliminary result)   Collection Time: 06/04/18  2:47 PM  Result Value Ref Range Status   Specimen Description BLOOD RIGHT HAND  Final   Special Requests   Final    BOTTLES DRAWN AEROBIC ONLY Blood  Culture adequate volume   Culture   Final    NO GROWTH 2 DAYS Performed at [facility], [address] [place], [place] [address]    Report Status PENDING  Incomplete  Blood Culture (routine x 2)     Status: None (Preliminary result)   Collection Time: 06/04/18  2:55 PM  Result Value Ref Range Status   Specimen Description RIGHT ANTECUBITAL  Final   Special Requests   Final    BOTTLES DRAWN AEROBIC AND ANAEROBIC Blood Culture adequate volume   Culture   Final    NO GROWTH 2 DAYS Performed at [facility], [address] [place], [place] [address]    Report Status PENDING  Incomplete  MRSA PCR Screening     Status: None   Collection Time: 06/04/18  5:52 PM  Result Value Ref Range Status   MRSA by PCR NEGATIVE NEGATIVE Final    Comment:        The GeneXpert MRSA Assay (FDA approved for NASAL  specimens only), is one component of a comprehensive MRSA colonization surveillance program. It is not intended to diagnose MRSA infection nor to guide or monitor treatment for MRSA infections. Performed at [facility], [address], [place], [place] [address]       Lab Results  Component Value Date   PTH 139 (H) 09/13/2014   CALCIUM 8.6 (L) 06/06/2018   CAION 1.25 (H) 09/11/2014   PHOS 3.5 06/06/2018         Impression: 1)Renal  AKI secondary to Prerenal/ATN                AKI sec to Hypovolemia/Hypotension/SEpsis               AKI on CKD               CKD stage 3 .               CKD since 2017               CKD secondary to DM/HTN/Hx of Multiple AKI                Progression of CKD marked with AKI                2)CVS Hypotension bp fragile Not on pressors  Afib  on amiodarone   -Cardizem drip   3)Anemia HGb at goal (9--11)   4)CKD Mineral-Bone Disorder Secondary Hyperparathyroidism present . Phosphorus at goal.   5)ID-admitted with sepsis  PMD following  6)Electrolytes  Normokalemic   Was hyperkalemic earlier  NOrmonatremic   7)Acid base Co2 at goal     Plan:  Will continue current care     [place] S 06/06/2018, 9:46 AM

## 2018-06-07 DIAGNOSIS — I4891 Unspecified atrial fibrillation: Secondary | ICD-10-CM

## 2018-06-07 DIAGNOSIS — A4151 Sepsis due to Escherichia coli [E. coli]: Principal | ICD-10-CM

## 2018-06-07 LAB — URINE CULTURE: Culture: >=100000 — AB

## 2018-06-07 LAB — BASIC METABOLIC PANEL
Anion gap: 9 (ref 5–15)
BUN: 78 mg/dL — ABNORMAL HIGH (ref 6–20)
CO2: 22 mmol/L (ref 22–32)
Calcium: 9 mg/dL (ref 8.9–10.3)
Chloride: 107 mmol/L (ref 98–111)
Creatinine, Ser: 2.47 mg/dL — ABNORMAL HIGH (ref 0.44–1.00)
GFR calc Af Amer: 24 mL/min — ABNORMAL LOW (ref >60)
GFR calc non Af Amer: 21 mL/min — ABNORMAL LOW (ref >60)
Glucose, Bld: 178 mg/dL — ABNORMAL HIGH (ref 70–99)
Potassium: 4.3 mmol/L (ref 3.5–5.1)
Sodium: 138 mmol/L (ref 135–145)

## 2018-06-07 LAB — CBC
HCT: 34.2 % — ABNORMAL LOW (ref 36.0–46.0)
Hemoglobin: 10.9 g/dL — ABNORMAL LOW (ref 12.0–15.0)
MCH: 28.2 pg (ref 26.0–34.0)
MCHC: 31.9 g/dL (ref 30.0–36.0)
MCV: 88.6 fL (ref 78.0–100.0)
Platelets: 262 10*3/uL (ref 150–400)
RBC: 3.86 MIL/uL — ABNORMAL LOW (ref 3.87–5.11)
RDW: 16.3 % — ABNORMAL HIGH (ref 11.5–15.5)
WBC: 13.5 10*3/uL — ABNORMAL HIGH (ref 4.0–10.5)

## 2018-06-07 LAB — GLUCOSE, CAPILLARY
Glucose-Capillary: 159 mg/dL — ABNORMAL HIGH (ref 70–99)
Glucose-Capillary: 126 mg/dL — ABNORMAL HIGH (ref 70–99)
Glucose-Capillary: 118 mg/dL — ABNORMAL HIGH (ref 70–99)
Glucose-Capillary: 112 mg/dL — ABNORMAL HIGH (ref 70–99)

## 2018-06-07 LAB — MAGNESIUM: Magnesium: 2.9 mg/dL — ABNORMAL HIGH (ref 1.7–2.4)

## 2018-06-07 MED ORDER — METOPROLOL SUCCINATE ER 25 MG PO TB24
25.0000 mg | ORAL_TABLET | Freq: Two times a day (BID) | ORAL | Status: DC
  Administered 2018-06-07: 25 mg via ORAL
  Filled 2018-06-07: qty 1

## 2018-06-07 MED ORDER — SODIUM CHLORIDE 0.9 % IV SOLN
1.0000 g | INTRAVENOUS | Status: DC
  Administered 2018-06-07 – 2018-06-09 (×3): 1 g via INTRAVENOUS
  Filled 2018-06-07: qty 1
  Filled 2018-06-07: qty 10
  Filled 2018-06-07: qty 1
  Filled 2018-06-07: qty 10
  Filled 2018-06-07: qty 1
  Filled 2018-06-07: qty 10

## 2018-06-07 MED ORDER — AMIODARONE LOAD VIA INFUSION
150.0000 mg | Freq: Once | INTRAVENOUS | Status: AC
  Administered 2018-06-07: 150 mg via INTRAVENOUS
  Filled 2018-06-07: qty 83.34

## 2018-06-07 NOTE — Progress Notes (Signed)
PROGRESS NOTE    ANNEKA STUDER  BTD:[id number] DOB: 1966/10/22 DOA: 06/04/2018 PCP: [name], MD   Brief Narrative:   52 year old female with a history of COPD, combined CHF, chronic kidney disease stage III, paroxysmal atrial tachycardia, presents to the hospital with several days of feeling unwell and generally weak. She has had some abdominal cramping, but no vomiting. She had noted decreased urine output. She was noted to be hypotensive and tachycardic on arrival. She was found to have acute kidney injury and chronic kidney disease with a creatinine of 7 and a potassium of 6.6. She also had evidence of E. coli UTI and possible pneumonia. She is been started on intravenous antibiotics as well as IV fluids. Nephrology is following for renal failure and her creatinine is slowly improving. She was initially started on a Cardizem infusion for her atrial tachyarrhythmias with no improvement and therefore cardiology had been consulted for evaluation on 8/12.  She unfortunately continued to have no improvement despite weaning of Cardizem drip and initiation of Coreg and was initiated on amiodarone drip yesterday evening.  She continues to have persistent tachyarrhythmias this morning.   Assessment & Plan:   Principal Problem:   Sepsis ([place]) Active Problems:   Alcohol abuse   Medical non-compliance   CAP (community acquired pneumonia)   CKD (chronic kidney disease) stage 3, GFR 30-59 ml/min (HCC)   Polysubstance abuse (including cocaine)   Hyperkalemia   Chronic pain syndrome   Chronic combined systolic and diastolic CHF (congestive heart failure) ([place])   DM (diabetes mellitus), type 2, uncontrolled, with renal complications (HCC)   COPD (chronic obstructive pulmonary disease) ([place])   AKI (acute kidney injury) ([name])   Acute lower UTI   A-fib ([place])   1. Sepsis-persistent likely secondary to E. coli UTI.  Continue azithromycin and Rocephin for now with urine  sensitivities pending.  Chest x-ray reviewed from 8/11 that appears to reveal more atelectasis than pneumonia at this time.  No symptoms consistent with pneumonia at this time.  Await further sensitivities and de-escalate antibiotics as appropriate. 2. Persistent atrial tachyarrhythmia-worsening.  Patient has history of paroxysmal atrial tachycardia and is being followed by cardiology.  She was initiated on IV amiodarone drip yesterday evening with brief conversion to sinus rhythm overnight, but has returned to her atrial tachyarrhythmia this morning. LVEF noted to be 30 to 35% in the past.  She remains on aspirin and Plavix has been discontinued by cardiology with initiation of Eliquis for anticoagulation. 3. Acute kidney injury on chronic kidney disease stage III-improving. Likely prerenal due to volume depletion. Nephrology following.  IV fluids have been withheld at this time.  Negative fluid balance noted over the last 2 days.  Urine output remains satisfactory. 4. Hyperkalemia-resolved. Related to acute kidney injury.   Received Kayexalate on 8/11.  Continue to monitor renal panel in a.m.  Magnesium elevated. 5. Acute respiratory failure with hypoxia-persistent. Currently on 2.5 L.  Continue to wean as tolerated.  Will likely need to get heart rate better controlled first. 6. Chronic combined CHF-stable. Ejection fraction of 30 to 35%. Negative fluid balance noted over the last 2 days. 7. COPD. No evidence of wheezing at this time. Continue bronchodilators as needed. 8. History of cerebral infarction. Continue on aspirin  and Plavix has been discontinued by cardiology due to initiation of Eliquis. 9. Insulin-dependent diabetes. Linagliptin currently on hold. Continue on Levemir. SSI increased yesterday with good improvement. Continue q6 hour accuchecks for now. On soft diet and  patient has poor appetite currently.   DVT prophylaxis:Heparin Code Status: Full Family Communication: Son  on phone Disposition Plan:  Rate and rhythm control with cardiology following while patient on amiodarone drip.  Appreciate further nephrology recommendations for AKI which is improving.  Monitor sensitivities.  Continue antibiotics.   Consultants:   Nephrology  Cardiology  Procedures:   None  Antimicrobials:   Rocephin and Azithromycin 8/10->  Subjective: Patient seen and evaluated today with no new acute complaints or concerns.  She did briefly convert to sinus rhythm overnight, but remains in atrial tachyarrhythmias this morning with rate 140-150 range.  She continues to complain of some weakness and poor appetite.  She did have bowel movement yesterday.  Objective: Vitals:   06/07/18 0615 06/07/18 0630 06/07/18 0645 06/07/18 0700  BP: 106/77 102/77 96/85 103/83  Pulse:  (!) 148 (!) 254 (!) 145  Resp: 17 17 17 19   Temp:      TempSrc:      SpO2:  100% 100% 99%  Weight:      Height:        Intake/Output Summary (Last 24 hours) at 06/07/2018 0711 Last data filed at 06/07/2018 0402 Gross per 24 hour  Intake 337.34 ml  Output 801 ml  Net -463.66 ml   Filed Weights   06/05/18 0519 06/06/18 0500 06/07/18 0400  Weight: 72.1 kg 73.5 kg 82.4 kg    Examination:  General exam: Appears calm and comfortable  Respiratory system: Clear to auscultation. Respiratory effort normal.  Currently on 2.5 L nasal cannula Cardiovascular system: S1 & S2 heard, tachycardic and irregular. No JVD, murmurs, rubs, gallops or clicks. No pedal edema. Gastrointestinal system: Abdomen is nondistended, soft and nontender. No organomegaly or masses felt. Normal bowel sounds heard. Central nervous system: Alert and oriented. No focal neurological deficits. Extremities: Symmetric 5 x 5 power. Skin: No rashes, lesions or ulcers Psychiatry: Judgement and insight appear normal. Mood & affect appropriate.     Data Reviewed: I have personally reviewed following labs and imaging  studies  CBC: Recent Labs  Lab 06/04/18 1428 06/05/18 0419 06/06/18 0429 06/07/18 0348  WBC 10.2 8.7 10.8* 13.5*  NEUTROABS 9.4* 7.7  --   --   HGB 11.4* 9.3* 9.1* 10.9*  HCT 35.3* 29.1* 29.3* 34.2*  MCV 89.1 87.9 88.8 88.6  PLT 358 297 279 630   Basic Metabolic Panel: Recent Labs  Lab 06/04/18 1428 06/04/18 1630 06/05/18 0419 06/06/18 0429 06/07/18 0348  NA 135 137 138 136 138  K 6.6* 6.5* 6.0* 4.8 4.3  CL 101 104 106 107 107  CO2 16* 18* 18* 21* 22  GLUCOSE 175* 164* 203* 180* 178*  BUN 106* 100* 94* 79* 78*  CREATININE 6.85* 6.32* 5.32* 3.23* 2.47*  CALCIUM 9.2 8.6* 8.1* 8.6* 9.0  MG  --   --   --   --  2.9*  PHOS  --   --   --  3.5  --    GFR: Estimated Creatinine Clearance: 25.3 mL/min (A) (by C-G formula based on SCr of 2.47 mg/dL (H)). Liver Function Tests: Recent Labs  Lab 06/04/18 1428 06/06/18 0429  AST 14*  --   ALT 10  --   ALKPHOS 87  --   BILITOT 1.0  --   PROT 8.1  --   ALBUMIN 2.4* 1.8*   Recent Labs  Lab 06/04/18 1428  LIPASE 17   No results for input(s): AMMONIA in the last 168 hours. Coagulation Profile: Recent Labs  Lab 06/04/18 1428  INR 1.41   Cardiac Enzymes: Recent Labs  Lab 06/04/18 1428  TROPONINI <0.03   BNP (last 3 results) No results for input(s): PROBNP in the last 8760 hours. HbA1C: No results for input(s): HGBA1C in the last 72 hours. CBG: Recent Labs  Lab 06/05/18 2059 06/06/18 0812 06/06/18 1200 06/06/18 1614 06/06/18 2103  GLUCAP 222* 190* 165* 161* 133*   Lipid Profile: No results for input(s): CHOL, HDL, LDLCALC, TRIG, CHOLHDL, LDLDIRECT in the last 72 hours. Thyroid Function Tests: Recent Labs    06/04/18 1428  TSH 0.694   Anemia Panel: No results for input(s): VITAMINB12, FOLATE, FERRITIN, TIBC, IRON, RETICCTPCT in the last 72 hours. Sepsis Labs: Recent Labs  Lab 06/04/18 1428 06/04/18 1450 06/04/18 1631 06/04/18 1827 06/05/18 0419 06/06/18 0429  PROCALCITON 58.72  --   --   --   59.18 35.75  LATICACIDVEN  --  2.44* 1.9 1.7  --   --     Recent Results (from the past 240 hour(s))  Urine Culture     Status: Abnormal   Collection Time: 06/04/18  2:07 PM  Result Value Ref Range Status   Specimen Description   Final    URINE, CLEAN CATCH Performed at [facility], [address], [place], [place] [address]    Special Requests   Final    NONE Performed at [facility], [address], [place], [place] [address]    Culture >=100,000 COLONIES/mL ESCHERICHIA COLI (A)  Final   Report Status 06/07/2018 FINAL  Final   Organism ID, Bacteria ESCHERICHIA COLI (A)  Final      Susceptibility   Escherichia coli - MIC*    AMPICILLIN 4 SENSITIVE Sensitive     CEFAZOLIN <=4 SENSITIVE Sensitive     CEFTRIAXONE <=1 SENSITIVE Sensitive     CIPROFLOXACIN <=0.25 SENSITIVE Sensitive     GENTAMICIN <=1 SENSITIVE Sensitive     IMIPENEM <=0.25 SENSITIVE Sensitive     NITROFURANTOIN <=16 SENSITIVE Sensitive     TRIMETH/SULFA <=20 SENSITIVE Sensitive     AMPICILLIN/SULBACTAM <=2 SENSITIVE Sensitive     PIP/TAZO <=4 SENSITIVE Sensitive     Extended ESBL NEGATIVE Sensitive     * >=100,000 COLONIES/mL ESCHERICHIA COLI  Blood Culture (routine x 2)     Status: None (Preliminary result)   Collection Time: 06/04/18  2:47 PM  Result Value Ref Range Status   Specimen Description BLOOD RIGHT HAND  Final   Special Requests   Final    BOTTLES DRAWN AEROBIC ONLY Blood Culture adequate volume   Culture   Final    NO GROWTH 2 DAYS Performed at [facility], [address], [place], [place] [address]    Report Status PENDING  Incomplete  Blood Culture (routine x 2)     Status: None (Preliminary result)   Collection Time: 06/04/18  2:55 PM  Result Value Ref Range Status   Specimen Description RIGHT ANTECUBITAL  Final   Special Requests   Final    BOTTLES DRAWN AEROBIC AND ANAEROBIC Blood Culture adequate volume   Culture   Final    NO GROWTH 2 DAYS Performed at [facility], [address] [place], [place] [address]    Report Status PENDING  Incomplete  MRSA PCR Screening     Status: None   Collection Time: 06/04/18  5:52 PM  Result Value Ref Range Status   MRSA by PCR NEGATIVE NEGATIVE Final    Comment:  The GeneXpert MRSA Assay (FDA approved for NASAL specimens only), is one component of a comprehensive MRSA colonization surveillance program. It is not intended to diagnose MRSA infection nor to guide or monitor treatment for MRSA infections. Performed at [facility], [address] [place], [place] [address]          Radiology Studies: US Renal  Result Date: 06/05/2018 CLINICAL DATA:  Acute kidney injury EXAM: RENAL / URINARY TRACT ULTRASOUND COMPLETE COMPARISON:  None. FINDINGS: Right Kidney: Length: 9.3 cm.  Echogenic renal parenchyma.  No hydronephrosis. Left Kidney: Length: 9.9 cm.  Echogenic renal parenchyma.  No hydronephrosis. Bladder: Not discretely visualized. IMPRESSION: Echogenic renal parenchyma bilaterally, suggesting medical renal disease. No hydronephrosis. Electronically Signed   By: Julian Hy M.D.   On: 06/05/2018 12:44   Dg Chest Port 1 View  Result Date: 06/05/2018 CLINICAL DATA:  Shortness of breath, generalized weakness EXAM: PORTABLE CHEST 1 VIEW COMPARISON:  06/04/2018 FINDINGS: Low lung volumes. Cardiomegaly with pulmonary vascular congestion and possible mild perihilar edema. Mild patchy bilateral lower lobe opacities, likely atelectasis. No definite pleural effusions. No pneumothorax. IMPRESSION: Cardiomegaly with possible mild perihilar edema. Mild patchy bilateral lower lobe opacities, likely atelectasis. Electronically Signed   By: Julian Hy M.D.   On: 06/05/2018 12:43        Scheduled Meds: . apixaban  2.5 mg Oral BID  . aspirin EC  81 mg Oral Daily  . carvedilol  6.25 mg Oral BID WC  . folic acid  1 mg Oral Daily  . insulin aspart  0-15 Units Subcutaneous TID WC  . insulin aspart  0-5 Units Subcutaneous  QHS  . insulin aspart  3 Units Subcutaneous TID WC  . insulin detemir  10 Units Subcutaneous QHS  . multivitamin with minerals  1 tablet Oral Daily  . thiamine  100 mg Oral Daily   Or  . thiamine  100 mg Intravenous Daily  . tiotropium  18 mcg Inhalation Daily   Continuous Infusions: . amiodarone 30 mg/hr (06/07/18 0428)  . azithromycin Stopped (06/06/18 2050)  . cefTRIAXone (ROCEPHIN)  IV Stopped (06/06/18 1449)  . sodium chloride       LOS: 3 days    Time spent: 30 minutes    Zamier Eggebrecht Darleen Crocker, DO [facility] Pager [phone]  If 7PM-7AM, please contact night-coverage [email or web address] Password [facility] 06/07/2018, 7:11 AM

## 2018-06-07 NOTE — Progress Notes (Signed)
Subjective: Interval History: has complaints weakness.  She complains of also some constipation.  She did not have any nausea or vomiting.  Appetite is poor..  Objective: Vital signs in last 24 hours: Temp:  [97.6 F (36.4 C)-98.1 F (36.7 C)] 98.1 F (36.7 C) (08/13 0719) Pulse Rate:  [49-292] 141 (08/13 0719) Resp:  [14-31] 19 (08/13 0719) BP: (80-129)/(51-117) 103/83 (08/13 0700) SpO2:  [93 %-100 %] 100 % (08/13 0750) Weight:  [82.4 kg] 82.4 kg (08/13 0400) Weight change: 8.9 kg  Intake/Output from previous day: 08/12 0701 - 08/13 0700 In: 337.3 [I.V.:237.3; IV Piggyback:100] Out: 294 [Urine:800; Stool:1] Intake/Output this shift: No intake/output data recorded.  General appearance: alert, cooperative and no distress Resp: clear to auscultation bilaterally Cardio: regular rate and rhythm GI: soft, non-tender; bowel sounds normal; no masses,  no organomegaly Extremities: No edema  Lab Results: Recent Labs    06/06/18 0429 06/07/18 0348  WBC 10.8* 13.5*  HGB 9.1* 10.9*  HCT 29.3* 34.2*  PLT 279 262   BMET:  Recent Labs    06/06/18 0429 06/07/18 0348  NA 136 138  K 4.8 4.3  CL 107 107  CO2 21* 22  GLUCOSE 180* 178*  BUN 79* 78*  CREATININE 3.23* 2.47*  CALCIUM 8.6* 9.0   No results for input(s): PTH in the last 72 hours. Iron Studies: No results for input(s): IRON, TIBC, TRANSFERRIN, FERRITIN in the last 72 hours.  Studies/Results: US Renal  Result Date: 06/05/2018 CLINICAL DATA:  Acute kidney injury EXAM: RENAL / URINARY TRACT ULTRASOUND COMPLETE COMPARISON:  None. FINDINGS: Right Kidney: Length: 9.3 cm.  Echogenic renal parenchyma.  No hydronephrosis. Left Kidney: Length: 9.9 cm.  Echogenic renal parenchyma.  No hydronephrosis. Bladder: Not discretely visualized. IMPRESSION: Echogenic renal parenchyma bilaterally, suggesting medical renal disease. No hydronephrosis. Electronically Signed   By: Julian Hy M.D.   On: 06/05/2018 12:44   Dg Chest Port  1 View  Result Date: 06/05/2018 CLINICAL DATA:  Shortness of breath, generalized weakness EXAM: PORTABLE CHEST 1 VIEW COMPARISON:  06/04/2018 FINDINGS: Low lung volumes. Cardiomegaly with pulmonary vascular congestion and possible mild perihilar edema. Mild patchy bilateral lower lobe opacities, likely atelectasis. No definite pleural effusions. No pneumothorax. IMPRESSION: Cardiomegaly with possible mild perihilar edema. Mild patchy bilateral lower lobe opacities, likely atelectasis. Electronically Signed   By: Julian Hy M.D.   On: 06/05/2018 12:43    I have reviewed the patient's current medications.  Assessment/Plan: 1] acute kidney injury superimposed on chronic.  Etiology was thought to be secondary to ATN/prerenal.  Presently her renal function is improving.  She is nonoliguric.  Patient with underlying stage III chronic renal failure. 2] hyperkalemia: Her potassium has corrected 3] sepsis: Thought to be secondary to UTI and pneumonia.  She is on antibiotics and improving. 4] diabetes 5] bone and mineral disorder: Her calcium and phosphorus is a range 6] anemia: Her hemoglobin is within our target goal. Plan: 1] we will continue his present management 2] we will check her renal panel in the morning.    LOS: 3 days   Avinash Maltos S 06/07/2018,8:00 AM

## 2018-06-07 NOTE — Progress Notes (Signed)
Progress Note  Patient Name: [name] Date of Encounter: 06/07/2018  Primary Cardiologist: Kate Sable, MD   Subjective   Feels the same as yesterday. Denies chest pain. Has to clear throat.  Inpatient Medications    Scheduled Meds: . apixaban  2.5 mg Oral BID  . aspirin EC  81 mg Oral Daily  . carvedilol  6.25 mg Oral BID WC  . folic acid  1 mg Oral Daily  . insulin aspart  0-15 Units Subcutaneous TID WC  . insulin aspart  0-5 Units Subcutaneous QHS  . insulin aspart  3 Units Subcutaneous TID WC  . insulin detemir  10 Units Subcutaneous QHS  . multivitamin with minerals  1 tablet Oral Daily  . thiamine  100 mg Oral Daily   Or  . thiamine  100 mg Intravenous Daily  . tiotropium  18 mcg Inhalation Daily   Continuous Infusions: . amiodarone 30 mg/hr (06/07/18 0428)  . azithromycin Stopped (06/06/18 2050)  . cefTRIAXone (ROCEPHIN)  IV Stopped (06/06/18 1449)  . sodium chloride     PRN Meds: ipratropium-albuterol, LORazepam **OR** LORazepam, traMADol   Vital Signs    Vitals:   06/07/18 0700 06/07/18 0719 06/07/18 0750 06/07/18 0806  BP: 103/83   106/86  Pulse: (!) 145 (!) 141  (!) 140  Resp: 19 19    Temp:  98.1 F (36.7 C)    TempSrc:  Oral    SpO2: 99% 100% 100%   Weight:      Height:        Intake/Output Summary (Last 24 hours) at 06/07/2018 0855 Last data filed at 06/07/2018 0402 Gross per 24 hour  Intake 337.34 ml  Output 801 ml  Net -463.66 ml   Filed Weights   06/05/18 0519 06/06/18 0500 06/07/18 0400  Weight: 72.1 kg 73.5 kg 82.4 kg    Telemetry    PAT, PAC's, PVC's - Personally Reviewed  ECG    NA - Personally Reviewed  Physical Exam   GEN: No acute distress.   Neck: No JVD Cardiac: Tachycardic, regular, no murmurs, rubs, or gallops.  Respiratory: Clear to auscultation bilaterally. GI: Soft, nontender, non-distended  MS: No edema; No deformity. Neuro:  Nonfocal  Psych: Normal affect   Labs    Chemistry Recent  Labs  Lab 06/04/18 1428  06/05/18 0419 06/06/18 0429 06/07/18 0348  NA 135   < > 138 136 138  K 6.6*   < > 6.0* 4.8 4.3  CL 101   < > 106 107 107  CO2 16*   < > 18* 21* 22  GLUCOSE 175*   < > 203* 180* 178*  BUN 106*   < > 94* 79* 78*  CREATININE 6.85*   < > 5.32* 3.23* 2.47*  CALCIUM 9.2   < > 8.1* 8.6* 9.0  PROT 8.1  --   --   --   --   ALBUMIN 2.4*  --   --  1.8*  --   AST 14*  --   --   --   --   ALT 10  --   --   --   --   ALKPHOS 87  --   --   --   --   BILITOT 1.0  --   --   --   --   GFRNONAA 6*   < > 8* 15* 21*  GFRAA 7*   < > 10* 18* 24*  ANIONGAP 18*   < >  14 8 9    < > = values in this interval not displayed.     Hematology Recent Labs  Lab 06/05/18 0419 06/06/18 0429 06/07/18 0348  WBC 8.7 10.8* 13.5*  RBC 3.31* 3.30* 3.86*  HGB 9.3* 9.1* 10.9*  HCT 29.1* 29.3* 34.2*  MCV 87.9 88.8 88.6  MCH 28.1 27.6 28.2  MCHC 32.0 31.1 31.9  RDW 16.1* 16.3* 16.3*  PLT 297 279 262    Cardiac Enzymes Recent Labs  Lab 06/04/18 1428  TROPONINI <0.03   No results for input(s): TROPIPOC in the last 168 hours.   BNPNo results for input(s): BNP, PROBNP in the last 168 hours.   DDimer No results for input(s): DDIMER in the last 168 hours.   Radiology    US Renal  Result Date: 06/05/2018 CLINICAL DATA:  Acute kidney injury EXAM: RENAL / URINARY TRACT ULTRASOUND COMPLETE COMPARISON:  None. FINDINGS: Right Kidney: Length: 9.3 cm.  Echogenic renal parenchyma.  No hydronephrosis. Left Kidney: Length: 9.9 cm.  Echogenic renal parenchyma.  No hydronephrosis. Bladder: Not discretely visualized. IMPRESSION: Echogenic renal parenchyma bilaterally, suggesting medical renal disease. No hydronephrosis. Electronically Signed   By: Julian Hy M.D.   On: 06/05/2018 12:44   Dg Chest Port 1 View  Result Date: 06/05/2018 CLINICAL DATA:  Shortness of breath, generalized weakness EXAM: PORTABLE CHEST 1 VIEW COMPARISON:  06/04/2018 FINDINGS: Low lung volumes. Cardiomegaly with  pulmonary vascular congestion and possible mild perihilar edema. Mild patchy bilateral lower lobe opacities, likely atelectasis. No definite pleural effusions. No pneumothorax. IMPRESSION: Cardiomegaly with possible mild perihilar edema. Mild patchy bilateral lower lobe opacities, likely atelectasis. Electronically Signed   By: Julian Hy M.D.   On: 06/05/2018 12:43    Cardiac Studies   None this admission  Patient Profile     52 y.o. female with a nonischemic cardiomyopathy ejection fraction 20 to 25% in the past, normal coronary arteries on cath 07/2016, history of CVA on Plavix and aspirin, hypertension, CKD stage III, DM, history of polysubstance abuse with EtOH and cocaine hospitalized with urosepsis whom we are asked to evaluate for atrial tachyarrhythmias.  Assessment & Plan    1. Rapid atrial fibrillation/atrial tachycardia: She appears to be in atrial tachycardia, for which EP evaluation was recommended in 2018 but she never pursued it. I stopped IV diltiazem yesterday due to negative inotropic effects in the context of LV dysfunction and started IV amiodarone. BP is low normal. I will switch carvedilol to Toprol-XL in the hopes of achieving better rate control with less effects on BP. I will also rebolus IV amiodarone 150 mg. I started apixaban 2.5 mg bid on 8/12.  2. Nonischemic cardiomyopathy/chronic systolic heart failure: Does not appear decompensated. Will switch carvedilol to Toprol-XL as noted above.   3. HTN: BP low normal. Will switch carvedilol to Toprol-XL as noted above.   4. Acute on chronic renal failure/CKD stage 3: SCr down to 2.47, 3.23 on 8/12.  5. Urosepsis: WBC's increasing from yesterday, up to 13.5. Blood cultures normal. UC positive for pan-sensitive E. Coli. Afebrile. Currently on azithro and ceftriaxone. Will defer de-escalation of antimicrobial therapy to internal medicine.  6. History of CVA: On ASA. I dc Plavix and started apixaban 2.5 mg bid given  atrial fib on 8/12. On Crestor at home.   For questions or updates, please contact [place] Please consult [email or web address] for contact info under Cardiology/STEMI.      Signed, Kate Sable, MD  06/07/2018, 8:55 AM

## 2018-06-08 DIAGNOSIS — D72828 Other elevated white blood cell count: Secondary | ICD-10-CM

## 2018-06-08 DIAGNOSIS — B9620 Unspecified Escherichia coli [E. coli] as the cause of diseases classified elsewhere: Secondary | ICD-10-CM

## 2018-06-08 DIAGNOSIS — N170 Acute kidney failure with tubular necrosis: Secondary | ICD-10-CM

## 2018-06-08 DIAGNOSIS — I428 Other cardiomyopathies: Secondary | ICD-10-CM

## 2018-06-08 DIAGNOSIS — D62 Acute posthemorrhagic anemia: Secondary | ICD-10-CM

## 2018-06-08 DIAGNOSIS — D72829 Elevated white blood cell count, unspecified: Secondary | ICD-10-CM

## 2018-06-08 DIAGNOSIS — E876 Hypokalemia: Secondary | ICD-10-CM

## 2018-06-08 DIAGNOSIS — I10 Essential (primary) hypertension: Secondary | ICD-10-CM

## 2018-06-08 DIAGNOSIS — Z8673 Personal history of transient ischemic attack (TIA), and cerebral infarction without residual deficits: Secondary | ICD-10-CM

## 2018-06-08 DIAGNOSIS — R310 Gross hematuria: Secondary | ICD-10-CM

## 2018-06-08 DIAGNOSIS — N3001 Acute cystitis with hematuria: Secondary | ICD-10-CM

## 2018-06-08 DIAGNOSIS — R6889 Other general symptoms and signs: Secondary | ICD-10-CM

## 2018-06-08 DIAGNOSIS — I481 Persistent atrial fibrillation: Secondary | ICD-10-CM

## 2018-06-08 LAB — RENAL FUNCTION PANEL
Albumin: 1.7 g/dL — ABNORMAL LOW (ref 3.5–5.0)
Anion gap: 11 (ref 5–15)
BUN: 57 mg/dL — ABNORMAL HIGH (ref 6–20)
CO2: 22 mmol/L (ref 22–32)
Calcium: 8.6 mg/dL — ABNORMAL LOW (ref 8.9–10.3)
Chloride: 106 mmol/L (ref 98–111)
Creatinine, Ser: 1.65 mg/dL — ABNORMAL HIGH (ref 0.44–1.00)
GFR calc Af Amer: 40 mL/min — ABNORMAL LOW (ref >60)
GFR calc non Af Amer: 34 mL/min — ABNORMAL LOW (ref >60)
Glucose, Bld: 114 mg/dL — ABNORMAL HIGH (ref 70–99)
Phosphorus: 3.3 mg/dL (ref 2.5–4.6)
Potassium: 3.5 mmol/L (ref 3.5–5.1)
Sodium: 139 mmol/L (ref 135–145)

## 2018-06-08 LAB — CBC
HCT: 32.5 % — ABNORMAL LOW (ref 36.0–46.0)
Hemoglobin: 10.4 g/dL — ABNORMAL LOW (ref 12.0–15.0)
MCH: 28.1 pg (ref 26.0–34.0)
MCHC: 32 g/dL (ref 30.0–36.0)
MCV: 87.8 fL (ref 78.0–100.0)
Platelets: 280 10*3/uL (ref 150–400)
RBC: 3.7 MIL/uL — ABNORMAL LOW (ref 3.87–5.11)
RDW: 16.3 % — ABNORMAL HIGH (ref 11.5–15.5)
WBC: 15.2 10*3/uL — ABNORMAL HIGH (ref 4.0–10.5)

## 2018-06-08 LAB — GLUCOSE, CAPILLARY
Glucose-Capillary: 91 mg/dL (ref 70–99)
Glucose-Capillary: 109 mg/dL — ABNORMAL HIGH (ref 70–99)
Glucose-Capillary: 91 mg/dL (ref 70–99)
Glucose-Capillary: 107 mg/dL — ABNORMAL HIGH (ref 70–99)

## 2018-06-08 MED ORDER — METOPROLOL TARTRATE 5 MG/5ML IV SOLN
5.0000 mg | Freq: Once | INTRAVENOUS | Status: AC
  Administered 2018-06-08: 5 mg via INTRAVENOUS
  Filled 2018-06-08: qty 5

## 2018-06-08 MED ORDER — METOPROLOL SUCCINATE ER 50 MG PO TB24
50.0000 mg | ORAL_TABLET | Freq: Two times a day (BID) | ORAL | Status: DC
  Administered 2018-06-08 – 2018-06-20 (×25): 50 mg via ORAL
  Filled 2018-06-08 (×26): qty 1

## 2018-06-08 NOTE — Progress Notes (Signed)
Pts HR back up to 145 and sustaining. Pt is still on Amiodarone gtt. Dr. Olevia Bowens paged and made aware. Pt asymptomatic- BP-117/94, O2-97% on 2L O2 per [place]. Pt resting quietly not c/o CP. Waiting for orders/call back. Will continue to monitor pt

## 2018-06-08 NOTE — Progress Notes (Signed)
Progress Note  Patient Name: [name] Date of Encounter: 06/08/2018  Primary Cardiologist: Kate Sable, MD   Subjective   Has some back discomfort. Unable to get comfortable.  Inpatient Medications    Scheduled Meds: . apixaban  2.5 mg Oral BID  . aspirin EC  81 mg Oral Daily  . folic acid  1 mg Oral Daily  . insulin aspart  0-15 Units Subcutaneous TID WC  . insulin aspart  0-5 Units Subcutaneous QHS  . insulin aspart  3 Units Subcutaneous TID WC  . insulin detemir  10 Units Subcutaneous QHS  . metoprolol succinate  50 mg Oral BID  . multivitamin with minerals  1 tablet Oral Daily  . thiamine  100 mg Oral Daily   Or  . thiamine  100 mg Intravenous Daily  . tiotropium  18 mcg Inhalation Daily   Continuous Infusions: . amiodarone 30 mg/hr (06/08/18 0600)  . cefTRIAXone (ROCEPHIN)  IV Stopped (06/07/18 1637)  . sodium chloride     PRN Meds: ipratropium-albuterol, traMADol   Vital Signs    Vitals:   06/08/18 0400 06/08/18 0500 06/08/18 0600 06/08/18 0723  BP: (!) 128/97 116/76 129/84 (!) 120/105  Pulse: 92 84 88 (!) 117  Resp: 16 20 19  (!) 21  Temp: 98.5 F (36.9 C)     TempSrc: Oral     SpO2: 100% 93% 98% 97%  Weight:      Height:        Intake/Output Summary (Last 24 hours) at 06/08/2018 0837 Last data filed at 06/08/2018 0600 Gross per 24 hour  Intake 1135.93 ml  Output 1600 ml  Net -464.07 ml   Filed Weights   06/05/18 0519 06/06/18 0500 06/07/18 0400  Weight: 72.1 kg 73.5 kg 82.4 kg    Telemetry    Currently in a fib, episodes of PAT as well - Personally Reviewed  ECG    NA - Personally Reviewed  Physical Exam   GEN: No acute distress.   Neck: No JVD Cardiac: Tachycardic, irregular, no murmurs, rubs, or gallops.  Respiratory: Clear to auscultation bilaterally. GI: Soft, nontender, non-distended  MS: No edema; No deformity. Neuro:  Nonfocal  Psych: Normal affect   Labs    Chemistry Recent Labs  Lab 06/04/18 1428   06/06/18 0429 06/07/18 0348 06/08/18 0408  NA 135   < > 136 138 139  K 6.6*   < > 4.8 4.3 3.5  CL 101   < > 107 107 106  CO2 16*   < > 21* 22 22  GLUCOSE 175*   < > 180* 178* 114*  BUN 106*   < > 79* 78* 57*  CREATININE 6.85*   < > 3.23* 2.47* 1.65*  CALCIUM 9.2   < > 8.6* 9.0 8.6*  PROT 8.1  --   --   --   --   ALBUMIN 2.4*  --  1.8*  --  1.7*  AST 14*  --   --   --   --   ALT 10  --   --   --   --   ALKPHOS 87  --   --   --   --   BILITOT 1.0  --   --   --   --   GFRNONAA 6*   < > 15* 21* 34*  GFRAA 7*   < > 18* 24* 40*  ANIONGAP 18*   < > 8 9 11    < > =  values in this interval not displayed.     Hematology Recent Labs  Lab 06/06/18 0429 06/07/18 0348 06/08/18 0408  WBC 10.8* 13.5* 15.2*  RBC 3.30* 3.86* 3.70*  HGB 9.1* 10.9* 10.4*  HCT 29.3* 34.2* 32.5*  MCV 88.8 88.6 87.8  MCH 27.6 28.2 28.1  MCHC 31.1 31.9 32.0  RDW 16.3* 16.3* 16.3*  PLT 279 262 280    Cardiac Enzymes Recent Labs  Lab 06/04/18 1428  TROPONINI <0.03   No results for input(s): TROPIPOC in the last 168 hours.   BNPNo results for input(s): BNP, PROBNP in the last 168 hours.   DDimer No results for input(s): DDIMER in the last 168 hours.   Radiology    No results found.  Cardiac Studies   None this admission  Patient Profile     52 y.o. female with a nonischemic cardiomyopathy ejection fraction 20 to 25% in the past, normal coronary arteries on cath 07/2016, history of CVA on Plavix and aspirin, hypertension, CKD stage III, DM, history of polysubstance abuse with EtOH and cocaine hospitalized with urosepsis whom we are asked to evaluate for atrial tachyarrhythmias.  Assessment & Plan    1. Rapid atrial fibrillation/atrial tachycardia: She has had recurrent atrial tachycardia, for which EP evaluation was recommended in 2018 but she never pursued it. Presently in rapid atrial fibrillation. I stopped IV diltiazem on 8/12 due to negative inotropic effects in the context of LV dysfunction  and started IV amiodarone. BP is stable. I switched carvedilol to Toprol-XL in the hopes of achieving better rate control with less effects on BP on 8/13. I will increase to 50 mg bid. I started apixaban 2.5 mg bid on 8/12. She will need follow up with EP for ablation consideration.  2. Nonischemic cardiomyopathy/chronic systolic heart failure: Does not appear decompensated. Increase Toprol-XL as noted above.   3. HTN: BP stable. Will monitor given increase in Toprol XL dose as noted above.  4. Acute on chronic renal failure/CKD stage 3: SCr down to 1.65, 2.47 on 8/13.  5. Urosepsis: WBC's increasing from yesterday, up to 15.2. Blood cultures normal. UC positive for pan-sensitive E. Coli. Afebrile. Currently on azithro and ceftriaxone. Will defer de-escalation of antimicrobial therapy to internal medicine.  6. History of CVA: On ASA. I dc Plavix and started apixaban 2.5 mg bid given atrial fib on 8/12. On Crestor at home.  For questions or updates, please contact [place] Please consult [email or web address] for contact info under Cardiology/STEMI.      Signed, Kate Sable, MD  06/08/2018, 8:37 AM

## 2018-06-08 NOTE — Care Management Important Message (Signed)
Important Message  Patient Details  Name: [name] MRN: [id number] Date of Birth: 01-25-1966   Medicare Important Message Given:  Yes    Shelda Altes 06/08/2018, 12:04 PM

## 2018-06-08 NOTE — Progress Notes (Signed)
RN adv Dr. Olevia Bowens verbally of pts HR sustaining in the 140's and on Amiodarone gtt- also receives Metoprolol BID po and had to get a 150mg  bolus of Amiodarone during day shift. Pts leads were changed out x2 to make HR was reading correctly BP- WNL.   New order for Metoprolol 5mg  IV x. Will continue to monitor pt and let Dr. Olevia Bowens know if HR sustains.

## 2018-06-08 NOTE — Progress Notes (Signed)
PROGRESS NOTE                                                                                                                                                                                                             Patient Demographics:    [name], is a 52 y.o. female, DOB - 1966-03-07, MOQ:[id number]  Admit date - 06/04/2018   Admitting Physician Phillips Grout, MD  Outpatient Primary MD for the patient is [name], MD  LOS - 4  Outpatient Specialists: none  Chief Complaint  Patient presents with  . Constipation       Brief Narrative   52 year old female with history of  COPD, nonischemic cardiomyopathy, chronic kidney disease stage III, paroxysmal atrial tachycardia, presented to the hospital with generalized weakness for past several associated with abdominal cramping and decreased urine output.  She was found to have ATN, was hypotensive and tachycardic on presentation. She was also septic with Escherichia coli UTI and possible pneumonia. Hospital course prolonged with persistent A. Fib requiring amiodarone drip.   Subjective:    patient reports feeling weak. Heart rate persistently elevated while on amiodarone drip.   Assessment  & Plan :   Principal problem Atrial tachycardia/A. Fib with RVR ([place]) Persistent without improvement with Cardizem drip(was discontinued on 8/12 given LV dysfunction). Now on and amiodarone drip (given bolus dose yesterday). Heart rate in 120s this morning. Coreg switched to Toprol by cardiology yesterday and was in 50 mg twice a day this morning. EF of 30-35% on echo. Monitor K and mag. Patient started on eliquis this admission. Plavix discontinued. Cardiology recommends outpatient follow-up with EP for ablation.  Active problems Sepsis ([place]) Secondary to pansensitive Escherichia coli UTI. Continue empiric Rocephin. Sepsis currently resolved. WBC elevated to 15.2  but remains afebrile. Continue on current antibiotics.   Acute kidney injury on chronic kidney disease stage III(HCC) Renal function improving to baseline. ATN likely prerenal with dehydration. Avoid nephrotoxins. Nephrology consult appreciated.   Acute respiratory failure with hypoxia(HCC) Persistent and requiring 2.5 L oxygen. Wean as tolerated. As nonischemic myopathy without acute volume overload.  Nonischemic cardiomyopathy with chronic systolic and diastolic CHF Euvolemic. Toprol dose increased for better heart rate controlled.  Hyperkalemia Secondary to ATN. Now resolved.  History of CVA Patient was on aspirin and Plavix. Started on eliquis and Plavix  discontinued.  Insulin-dependent diabetes mellitus Continue Levemir with sliding scale coverage. lingliptin on hold.   Essential hypertension Stable.  Chronic back pain Reports taking tramadol at home. Resumed.  COPD Stable. Continue as need    Code Status : full code  Family Communication  : none at bedside  Disposition Plan  : pending clinical improvement, remains inpatient for now  Barriers For Discharge : active symptoms  Consults  : cardiology/renal  Procedures  : 2-D echo  DVT Prophylaxis  :  Lovenox -  Lab Results  Component Value Date   PLT 280 06/08/2018    Antibiotics  :    Anti-infectives (From admission, onward)   Start     Dose/Rate Route Frequency Ordered Stop   06/07/18 1500  cefTRIAXone (ROCEPHIN) 1 g in sodium chloride 0.9 % 100 mL IVPB     1 g 200 mL/hr over 30 Minutes Intravenous Every 24 hours 06/07/18 1041     06/04/18 1930  azithromycin (ZITHROMAX) 500 mg in sodium chloride 0.9 % 250 mL IVPB  Status:  Discontinued     500 mg 250 mL/hr over 60 Minutes Intravenous Every 24 hours 06/04/18 1622 06/07/18 1027   06/04/18 1630  cefTRIAXone (ROCEPHIN) 1 g in sodium chloride 0.9 % 100 mL IVPB  Status:  Discontinued     1 g 200 mL/hr over 30 Minutes Intravenous Every 24 hours 06/04/18  1622 06/05/18 0844   06/04/18 1530  azithromycin (ZITHROMAX) 500 mg in sodium chloride 0.9 % 250 mL IVPB     500 mg 250 mL/hr over 60 Minutes Intravenous  Once 06/04/18 1523 06/04/18 1759   06/04/18 1500  cefTRIAXone (ROCEPHIN) 2 g in sodium chloride 0.9 % 100 mL IVPB  Status:  Discontinued     2 g 200 mL/hr over 30 Minutes Intravenous Every 24 hours 06/04/18 1456 06/07/18 1041        Objective:   Vitals:   06/08/18 1045 06/08/18 1100 06/08/18 1106 06/08/18 1115  BP: (!) 125/103 (!) 116/103 118/84 122/90  Pulse: 86 82 85 78  Resp: (!) 21 (!) 23 (!) 22 20  Temp:      TempSrc:      SpO2: 99% 98% 99% 99%  Weight:      Height:        Wt Readings from Last 3 Encounters:  06/07/18 82.4 kg  05/26/18 69 kg  05/05/18 74.4 kg     Intake/Output Summary (Last 24 hours) at 06/08/2018 1120 Last data filed at 06/08/2018 1047 Gross per 24 hour  Intake 1265.91 ml  Output 1600 ml  Net -334.09 ml     Physical Exam  Gen: not in distress fatigue HEENT: moist mucosa, supple neck Chest: clear b/l, no added sounds CVS: s1 and S2 irregularly irregular, no murmurs GI: soft, NT, ND, BS+ , Foley catheter + Musculoskeletal: warm, no edema     Data Review:    CBC Recent Labs  Lab 06/04/18 1428 06/05/18 0419 06/06/18 0429 06/07/18 0348 06/08/18 0408  WBC 10.2 8.7 10.8* 13.5* 15.2*  HGB 11.4* 9.3* 9.1* 10.9* 10.4*  HCT 35.3* 29.1* 29.3* 34.2* 32.5*  PLT 358 297 279 262 280  MCV 89.1 87.9 88.8 88.6 87.8  MCH 28.8 28.1 27.6 28.2 28.1  MCHC 32.3 32.0 31.1 31.9 32.0  RDW 16.0* 16.1* 16.3* 16.3* 16.3*  LYMPHSABS 0.6* 0.6*  --   --   --   MONOABS 0.2 0.3  --   --   --   EOSABS 0.0  0.1  --   --   --   BASOSABS 0.0 0.0  --   --   --     Chemistries  Recent Labs  Lab 06/04/18 1428 06/04/18 1630 06/05/18 0419 06/06/18 0429 06/07/18 0348 06/08/18 0408  NA 135 137 138 136 138 139  K 6.6* 6.5* 6.0* 4.8 4.3 3.5  CL 101 104 106 107 107 106  CO2 16* 18* 18* 21* 22 22  GLUCOSE  175* 164* 203* 180* 178* 114*  BUN 106* 100* 94* 79* 78* 57*  CREATININE 6.85* 6.32* 5.32* 3.23* 2.47* 1.65*  CALCIUM 9.2 8.6* 8.1* 8.6* 9.0 8.6*  MG  --   --   --   --  2.9*  --   AST 14*  --   --   --   --   --   ALT 10  --   --   --   --   --   ALKPHOS 87  --   --   --   --   --   BILITOT 1.0  --   --   --   --   --    ------------------------------------------------------------------------------------------------------------------ No results for input(s): CHOL, HDL, LDLCALC, TRIG, CHOLHDL, LDLDIRECT in the last 72 hours.  Lab Results  Component Value Date   HGBA1C 7.2 (H) 02/09/2018   ------------------------------------------------------------------------------------------------------------------ No results for input(s): TSH, T4TOTAL, T3FREE, THYROIDAB in the last 72 hours.  Invalid input(s): FREET3 ------------------------------------------------------------------------------------------------------------------ No results for input(s): VITAMINB12, FOLATE, FERRITIN, TIBC, IRON, RETICCTPCT in the last 72 hours.  Coagulation profile Recent Labs  Lab 06/04/18 1428  INR 1.41    No results for input(s): DDIMER in the last 72 hours.  Cardiac Enzymes Recent Labs  Lab 06/04/18 1428  TROPONINI <0.03   ------------------------------------------------------------------------------------------------------------------    Component Value Date/Time   BNP 835.0 (H) 06/22/2017 1610    Inpatient Medications  Scheduled Meds: . apixaban  2.5 mg Oral BID  . aspirin EC  81 mg Oral Daily  . folic acid  1 mg Oral Daily  . insulin aspart  0-15 Units Subcutaneous TID WC  . insulin aspart  0-5 Units Subcutaneous QHS  . insulin aspart  3 Units Subcutaneous TID WC  . insulin detemir  10 Units Subcutaneous QHS  . metoprolol succinate  50 mg Oral BID  . multivitamin with minerals  1 tablet Oral Daily  . thiamine  100 mg Oral Daily   Or  . thiamine  100 mg Intravenous Daily  .  tiotropium  18 mcg Inhalation Daily   Continuous Infusions: . amiodarone 30 mg/hr (06/08/18 1047)  . cefTRIAXone (ROCEPHIN)  IV Stopped (06/07/18 1637)  . sodium chloride     PRN Meds:.ipratropium-albuterol, traMADol  Micro Results Recent Results (from the past 240 hour(s))  Urine Culture     Status: Abnormal   Collection Time: 06/04/18  2:07 PM  Result Value Ref Range Status   Specimen Description   Final    URINE, CLEAN CATCH Performed at [facility], [address] [place], [place] [address]    Special Requests   Final    NONE Performed at [facility], [address] [place], [place] [address]    Culture >=100,000 COLONIES/mL ESCHERICHIA COLI (A)  Final   Report Status 06/07/2018 FINAL  Final   Organism ID, Bacteria ESCHERICHIA COLI (A)  Final      Susceptibility   Escherichia coli - MIC*    AMPICILLIN 4 SENSITIVE Sensitive     CEFAZOLIN <=  4 SENSITIVE Sensitive     CEFTRIAXONE <=1 SENSITIVE Sensitive     CIPROFLOXACIN <=0.25 SENSITIVE Sensitive     GENTAMICIN <=1 SENSITIVE Sensitive     IMIPENEM <=0.25 SENSITIVE Sensitive     NITROFURANTOIN <=16 SENSITIVE Sensitive     TRIMETH/SULFA <=20 SENSITIVE Sensitive     AMPICILLIN/SULBACTAM <=2 SENSITIVE Sensitive     PIP/TAZO <=4 SENSITIVE Sensitive     Extended ESBL NEGATIVE Sensitive     * >=100,000 COLONIES/mL ESCHERICHIA COLI  Blood Culture (routine x 2)     Status: None (Preliminary result)   Collection Time: 06/04/18  2:47 PM  Result Value Ref Range Status   Specimen Description BLOOD RIGHT HAND  Final   Special Requests   Final    BOTTLES DRAWN AEROBIC ONLY Blood Culture adequate volume   Culture   Final    NO GROWTH 4 DAYS Performed at [facility], [address], [place], [place] [address]    Report Status PENDING  Incomplete  Blood Culture (routine x 2)     Status: None (Preliminary result)   Collection Time: 06/04/18  2:55 PM  Result Value Ref Range Status   Specimen Description RIGHT ANTECUBITAL  Final    Special Requests   Final    BOTTLES DRAWN AEROBIC AND ANAEROBIC Blood Culture adequate volume   Culture   Final    NO GROWTH 4 DAYS Performed at [facility], [address], [place], [place] [address]    Report Status PENDING  Incomplete  MRSA PCR Screening     Status: None   Collection Time: 06/04/18  5:52 PM  Result Value Ref Range Status   MRSA by PCR NEGATIVE NEGATIVE Final    Comment:        The GeneXpert MRSA Assay (FDA approved for NASAL specimens only), is one component of a comprehensive MRSA colonization surveillance program. It is not intended to diagnose MRSA infection nor to guide or monitor treatment for MRSA infections. Performed at [facility], [address], [place],  [address]     Radiology Reports US Renal  Result Date: 06/05/2018 CLINICAL DATA:  Acute kidney injury EXAM: RENAL / URINARY TRACT ULTRASOUND COMPLETE COMPARISON:  None. FINDINGS: Right Kidney: Length: 9.3 cm.  Echogenic renal parenchyma.  No hydronephrosis. Left Kidney: Length: 9.9 cm.  Echogenic renal parenchyma.  No hydronephrosis. Bladder: Not discretely visualized. IMPRESSION: Echogenic renal parenchyma bilaterally, suggesting medical renal disease. No hydronephrosis. Electronically Signed   By: Julian Hy M.D.   On: 06/05/2018 12:44   Dg Chest Port 1 View  Result Date: 06/05/2018 CLINICAL DATA:  Shortness of breath, generalized weakness EXAM: PORTABLE CHEST 1 VIEW COMPARISON:  06/04/2018 FINDINGS: Low lung volumes. Cardiomegaly with pulmonary vascular congestion and possible mild perihilar edema. Mild patchy bilateral lower lobe opacities, likely atelectasis. No definite pleural effusions. No pneumothorax. IMPRESSION: Cardiomegaly with possible mild perihilar edema. Mild patchy bilateral lower lobe opacities, likely atelectasis. Electronically Signed   By: Julian Hy M.D.   On: 06/05/2018 12:43   Dg Chest Port 1 View  Result Date: 06/04/2018 CLINICAL DATA:  Tachycardia,  upper abdomen pain and constipation for 5 days. EXAM: PORTABLE CHEST 1 VIEW COMPARISON:  June 30, 2017 FINDINGS: The mediastinal contour is normal. Heart size is enlarged. Consolidation of right lung base is identified. There is mild central pulmonary vascular congestion. There is elevation of the right hemidiaphragm unchanged. The visualized skeletal structures are stable. IMPRESSION: Right lung base pneumonia.  Central pulmonary vascular congestion. Electronically Signed  By: Abelardo Diesel M.D.   On: 06/04/2018 14:43    Time Spent in minutes  35  Eliah Marquard M.D on 06/08/2018 at 11:20 AM  Between 7am to 7pm - Pager - [phone]  After 7pm go to [email or web address] - password [facility]  [facility] -  Office  [phone]

## 2018-06-08 NOTE — Progress Notes (Signed)
[name]  MRN: [id number]  DOB/AGE: 1966/04/28 52 y.o.  Primary Care Physician:Simpson, Norwood Levo, MD  Admit date: 06/04/2018  Chief Complaint:  Chief Complaint  Patient presents with  . Constipation    S-Pt presented on  06/04/2018 with  Chief Complaint  Patient presents with  . Constipation  .    Pt offers no new concerns.   Meds . apixaban  2.5 mg Oral BID  . aspirin EC  81 mg Oral Daily  . folic acid  1 mg Oral Daily  . insulin aspart  0-15 Units Subcutaneous TID WC  . insulin aspart  0-5 Units Subcutaneous QHS  . insulin aspart  3 Units Subcutaneous TID WC  . insulin detemir  10 Units Subcutaneous QHS  . metoprolol succinate  50 mg Oral BID  . multivitamin with minerals  1 tablet Oral Daily  . thiamine  100 mg Oral Daily   Or  . thiamine  100 mg Intravenous Daily  . tiotropium  18 mcg Inhalation Daily       Physical Exam: Vital signs in last 24 hours: Temp:  [98.3 F (36.8 C)-99.7 F (37.6 C)] 98.5 F (36.9 C) (08/14 0400) Pulse Rate:  [84-145] 90 (08/14 0900) Resp:  [16-25] 16 (08/14 0900) BP: (105-137)/(76-105) 123/96 (08/14 0900) SpO2:  [91 %-100 %] 99 % (08/14 0918) Weight change:  Last BM Date: 06/07/18  Intake/Output from previous day: 08/13 0701 - 08/14 0700 In: 1135.9 [P.O.:480; I.V.:532.6; IV Piggyback:123.3] Out: 1600 [Urine:1600] Total I/O In: 100.2 [I.V.:100.2] Out: -    Physical Exam: General- pt is awake,alert, oriented to time place and person Resp- No acute REsp distress,  Rhonchi+ CVS- S1S2 irregular in rate and rhythm GIT- BS+, soft, NT, ND EXT- NO LE Edema, Cyanosis   Lab Results: CBC Recent Labs    06/07/18 0348 06/08/18 0408  WBC 13.5* 15.2*  HGB 10.9* 10.4*  HCT 34.2* 32.5*  PLT 262 280    BMET Recent Labs    06/07/18 0348 06/08/18 0408  NA 138 139  K 4.3 3.5  CL 107 106  CO2 22 22  GLUCOSE 178* 114*  BUN 78* 57*  CREATININE 2.47* 1.65*  CALCIUM 9.0 8.6*   Creat trend 2019  6.85=>3.23=>1.64    1.6 Baseline before admission 2018 1.4--2.5 2017 1.4--1.7 2015 1.3--3.4 2014 0.8--1.6 2012 0.6--3.0 2008 1.23  MICRO Recent Results (from the past 240 hour(s))  Urine Culture     Status: Abnormal   Collection Time: 06/04/18  2:07 PM  Result Value Ref Range Status   Specimen Description   Final    URINE, CLEAN CATCH Performed at [facility], [address], [place], [place] [address]    Special Requests   Final    NONE Performed at [facility], [address] [place], [place] [address]    Culture >=100,000 COLONIES/mL ESCHERICHIA COLI (A)  Final   Report Status 06/07/2018 FINAL  Final   Organism ID, Bacteria ESCHERICHIA COLI (A)  Final      Susceptibility   Escherichia coli - MIC*    AMPICILLIN 4 SENSITIVE Sensitive     CEFAZOLIN <=4 SENSITIVE Sensitive     CEFTRIAXONE <=1 SENSITIVE Sensitive     CIPROFLOXACIN <=0.25 SENSITIVE Sensitive     GENTAMICIN <=1 SENSITIVE Sensitive     IMIPENEM <=0.25 SENSITIVE Sensitive     NITROFURANTOIN <=16 SENSITIVE Sensitive     TRIMETH/SULFA <=20 SENSITIVE Sensitive     AMPICILLIN/SULBACTAM <=2 SENSITIVE Sensitive  PIP/TAZO <=4 SENSITIVE Sensitive     Extended ESBL NEGATIVE Sensitive     * >=100,000 COLONIES/mL ESCHERICHIA COLI  Blood Culture (routine x 2)     Status: None (Preliminary result)   Collection Time: 06/04/18  2:47 PM  Result Value Ref Range Status   Specimen Description BLOOD RIGHT HAND  Final   Special Requests   Final    BOTTLES DRAWN AEROBIC ONLY Blood Culture adequate volume   Culture   Final    NO GROWTH 4 DAYS Performed at [facility], [address], [place], [place] [address]    Report Status PENDING  Incomplete  Blood Culture (routine x 2)     Status: None (Preliminary result)   Collection Time: 06/04/18  2:55 PM  Result Value Ref Range Status   Specimen Description RIGHT ANTECUBITAL  Final   Special Requests   Final    BOTTLES DRAWN AEROBIC AND ANAEROBIC Blood Culture adequate volume    Culture   Final    NO GROWTH 4 DAYS Performed at [facility], [address], [place], [place] [address]    Report Status PENDING  Incomplete  MRSA PCR Screening     Status: None   Collection Time: 06/04/18  5:52 PM  Result Value Ref Range Status   MRSA by PCR NEGATIVE NEGATIVE Final    Comment:        The GeneXpert MRSA Assay (FDA approved for NASAL specimens only), is one component of a comprehensive MRSA colonization surveillance program. It is not intended to diagnose MRSA infection nor to guide or monitor treatment for MRSA infections. Performed at [facility], [address], [place], [place] [address]       Lab Results  Component Value Date   PTH 139 (H) 09/13/2014   CALCIUM 8.6 (L) 06/08/2018   CAION 1.25 (H) 09/11/2014   PHOS 3.3 06/08/2018   Alb 1.7 Corrected calcium  8.6+ 1.8=10.4      Impression: 1)Renal  AKI secondary to Prerenal/ATN                AKI sec to Hypovolemia/Hypotension/SEpsis               AKI on CKD               CKD stage 3 .               CKD since 2017               CKD secondary to DM/HTN/Hx of Multiple AKI                Progression of CKD marked with AKI                2)CVS Hypotension bp fragile Not on pressors  Afib  on metoprolol 3)Anemia HGb at goal (9--11)   4)CKD Mineral-Bone Disorder Secondary Hyperparathyroidism present . Phosphorus at goal. Calcium when corrected for low albumin is near to goal  5)ID-admitted with sepsis  PMD following  6)Electrolytes  Normokalemic   Was hyperkalemic earlier  NOrmonatremic   7)Acid base Co2 at goal     Plan:  Will continue current care     Chester S 06/08/2018, 10:07 AM

## 2018-06-09 ENCOUNTER — Inpatient Hospital Stay (HOSPITAL_COMMUNITY): Payer: Medicare Other

## 2018-06-09 LAB — CBC
HCT: 32.3 % — ABNORMAL LOW (ref 36.0–46.0)
Hemoglobin: 10.3 g/dL — ABNORMAL LOW (ref 12.0–15.0)
MCH: 28.1 pg (ref 26.0–34.0)
MCHC: 31.9 g/dL (ref 30.0–36.0)
MCV: 88.3 fL (ref 78.0–100.0)
Platelets: 277 10*3/uL (ref 150–400)
RBC: 3.66 MIL/uL — ABNORMAL LOW (ref 3.87–5.11)
RDW: 16.2 % — ABNORMAL HIGH (ref 11.5–15.5)
WBC: 22.5 10*3/uL — ABNORMAL HIGH (ref 4.0–10.5)

## 2018-06-09 LAB — BASIC METABOLIC PANEL
Anion gap: 13 (ref 5–15)
BUN: 43 mg/dL — ABNORMAL HIGH (ref 6–20)
CO2: 21 mmol/L — ABNORMAL LOW (ref 22–32)
Calcium: 8.6 mg/dL — ABNORMAL LOW (ref 8.9–10.3)
Chloride: 105 mmol/L (ref 98–111)
Creatinine, Ser: 1.43 mg/dL — ABNORMAL HIGH (ref 0.44–1.00)
GFR calc Af Amer: 47 mL/min — ABNORMAL LOW (ref >60)
GFR calc non Af Amer: 41 mL/min — ABNORMAL LOW (ref >60)
Glucose, Bld: 135 mg/dL — ABNORMAL HIGH (ref 70–99)
Potassium: 3.1 mmol/L — ABNORMAL LOW (ref 3.5–5.1)
Sodium: 139 mmol/L (ref 135–145)

## 2018-06-09 LAB — CULTURE, BLOOD (ROUTINE X 2)
Culture: NO GROWTH
Culture: NO GROWTH
Special Requests: ADEQUATE
Special Requests: ADEQUATE

## 2018-06-09 LAB — GLUCOSE, CAPILLARY
Glucose-Capillary: 122 mg/dL — ABNORMAL HIGH (ref 70–99)
Glucose-Capillary: 144 mg/dL — ABNORMAL HIGH (ref 70–99)
Glucose-Capillary: 125 mg/dL — ABNORMAL HIGH (ref 70–99)
Glucose-Capillary: 132 mg/dL — ABNORMAL HIGH (ref 70–99)

## 2018-06-09 MED ORDER — APIXABAN 5 MG PO TABS
5.0000 mg | ORAL_TABLET | Freq: Two times a day (BID) | ORAL | Status: DC
  Administered 2018-06-09 – 2018-06-11 (×5): 5 mg via ORAL
  Filled 2018-06-09 (×5): qty 1

## 2018-06-09 MED ORDER — POTASSIUM CHLORIDE IN NACL 20-0.45 MEQ/L-% IV SOLN
INTRAVENOUS | Status: DC
  Administered 2018-06-09 – 2018-06-10 (×2): via INTRAVENOUS
  Filled 2018-06-09 (×8): qty 1000

## 2018-06-09 MED ORDER — POTASSIUM CHLORIDE CRYS ER 20 MEQ PO TBCR
40.0000 meq | EXTENDED_RELEASE_TABLET | Freq: Once | ORAL | Status: AC
  Administered 2018-06-09: 40 meq via ORAL
  Filled 2018-06-09: qty 2

## 2018-06-09 MED ORDER — AMIODARONE HCL 200 MG PO TABS
200.0000 mg | ORAL_TABLET | Freq: Every day | ORAL | Status: DC
  Administered 2018-06-09 – 2018-06-20 (×12): 200 mg via ORAL
  Filled 2018-06-09 (×12): qty 1

## 2018-06-09 NOTE — Telephone Encounter (Signed)
Send this to Merrilee Seashore, the pt has [facility] Medicare, and see if he can assist with getting her set up

## 2018-06-09 NOTE — Progress Notes (Signed)
Subjective: Interval History: She is feeling much better however her appetite remains poor.  She denies any nausea or vomiting.  Denies also any difficulty breathing.  Objective: Vital signs in last 24 hours: Temp:  [98 F (36.7 C)-99.7 F (37.6 C)] 98.1 F (36.7 C) (08/15 0400) Pulse Rate:  [76-138] 81 (08/15 0530) Resp:  [15-33] 16 (08/15 0600) BP: (103-154)/(65-108) 131/88 (08/15 0600) SpO2:  [73 %-100 %] 100 % (08/15 0600) Weight:  [76.6 kg] 76.6 kg (08/15 0500) Weight change:   Intake/Output from previous day: 08/14 0701 - 08/15 0700 In: 250.3 [I.V.:150.3; IV Piggyback:100] Out: 8657 [Urine:1250; Stool:1] Intake/Output this shift: No intake/output data recorded.  General appearance: alert, cooperative and no distress Resp: clear to auscultation bilaterally Cardio: regular rate and rhythm GI: soft, non-tender; bowel sounds normal; no masses,  no organomegaly Extremities: No edema  Lab Results: Recent Labs    06/08/18 0408 06/09/18 0634  WBC 15.2* 22.5*  HGB 10.4* 10.3*  HCT 32.5* 32.3*  PLT 280 277   BMET:  Recent Labs    06/08/18 0408 06/09/18 0634  NA 139 139  K 3.5 3.1*  CL 106 105  CO2 22 21*  GLUCOSE 114* 135*  BUN 57* 43*  CREATININE 1.65* 1.43*  CALCIUM 8.6* 8.6*   No results for input(s): PTH in the last 72 hours. Iron Studies: No results for input(s): IRON, TIBC, TRANSFERRIN, FERRITIN in the last 72 hours.  Studies/Results: No results found.  I have reviewed the patient's current medications.  Assessment/Plan: 1] acute on chronic kidney injury.  Presently her creatinine is 1.43 progressively improving.  Renal function seems to be returning to her baseline.  Patient however still has a poor appetite.  She is none oliguric 2] hypokalemia: Patient was admitted with hyperkalemia.  Presently her potassium has gone down.  Most likely from poor appetite. 3] sepsis: Thought to be secondary to UTI and pneumonia.  She is a febrile but her white blood  cell count remains high.  Her blood pressure also has improved. 4] diabetes: Her blood sugar is reasonably controlled 5] bone and mineral disorder: Her calcium and phosphorus is a range 6] anemia: Her hemoglobin is within our target goal. Plan: 1] we will start patient on half-normal saline with 20 mEq of KCl at 75 cc/h. 2] we will check her renal panel in the morning.    LOS: 5 days   Toan Mort S 06/09/2018,7:49 AM

## 2018-06-09 NOTE — Progress Notes (Signed)
Progress Note  Patient Name: [name] Date of Encounter: 06/09/2018  Primary Cardiologist: Kate Sable, MD   Subjective   Requests outpatient meds for anxiety. Denies palpitations.  Inpatient Medications    Scheduled Meds: . amiodarone  200 mg Oral Daily  . apixaban  2.5 mg Oral BID  . aspirin EC  81 mg Oral Daily  . folic acid  1 mg Oral Daily  . insulin aspart  0-15 Units Subcutaneous TID WC  . insulin aspart  0-5 Units Subcutaneous QHS  . insulin aspart  3 Units Subcutaneous TID WC  . insulin detemir  10 Units Subcutaneous QHS  . metoprolol succinate  50 mg Oral BID  . multivitamin with minerals  1 tablet Oral Daily  . thiamine  100 mg Oral Daily   Or  . thiamine  100 mg Intravenous Daily  . tiotropium  18 mcg Inhalation Daily   Continuous Infusions: . 0.45 % NaCl with KCl 20 mEq / L    . cefTRIAXone (ROCEPHIN)  IV Stopped (06/08/18 1504)  . sodium chloride     PRN Meds: ipratropium-albuterol, traMADol   Vital Signs    Vitals:   06/09/18 0730 06/09/18 0745 06/09/18 0800 06/09/18 0815  BP: (!) 133/91 134/87 (!) 143/92 (!) 137/93  Pulse: 83 82 82   Resp: 19 (!) 25 (!) 24 (!) 21  Temp:      TempSrc:      SpO2: 95% 90% (!) 88%   Weight:      Height:        Intake/Output Summary (Last 24 hours) at 06/09/2018 0850 Last data filed at 06/09/2018 0800 Gross per 24 hour  Intake 584.3 ml  Output 1251 ml  Net -666.7 ml   Filed Weights   06/06/18 0500 06/07/18 0400 06/09/18 0500  Weight: 73.5 kg 82.4 kg 76.6 kg    Telemetry    Currently in sinus rhythm, prior PAC's, PVC's, brief atrial runs - Personally Reviewed  ECG     NA- Personally Reviewed  Physical Exam   GEN: No acute distress.   Neck: No JVD Cardiac: RRR, no murmurs, rubs, or gallops.  Respiratory: Clear to auscultation bilaterally. GI: Soft, nontender, non-distended  MS: No edema; No deformity. Neuro:  Nonfocal  Psych: Normal affect   Labs    Chemistry Recent Labs    Lab 06/04/18 1428  06/06/18 0429 06/07/18 0348 06/08/18 0408 06/09/18 0634  NA 135   < > 136 138 139 139  K 6.6*   < > 4.8 4.3 3.5 3.1*  CL 101   < > 107 107 106 105  CO2 16*   < > 21* 22 22 21*  GLUCOSE 175*   < > 180* 178* 114* 135*  BUN 106*   < > 79* 78* 57* 43*  CREATININE 6.85*   < > 3.23* 2.47* 1.65* 1.43*  CALCIUM 9.2   < > 8.6* 9.0 8.6* 8.6*  PROT 8.1  --   --   --   --   --   ALBUMIN 2.4*  --  1.8*  --  1.7*  --   AST 14*  --   --   --   --   --   ALT 10  --   --   --   --   --   ALKPHOS 87  --   --   --   --   --   BILITOT 1.0  --   --   --   --   --  GFRNONAA 6*   < > 15* 21* 34* 41*  GFRAA 7*   < > 18* 24* 40* 47*  ANIONGAP 18*   < > 8 9 11 13    < > = values in this interval not displayed.     Hematology Recent Labs  Lab 06/07/18 0348 06/08/18 0408 06/09/18 0634  WBC 13.5* 15.2* 22.5*  RBC 3.86* 3.70* 3.66*  HGB 10.9* 10.4* 10.3*  HCT 34.2* 32.5* 32.3*  MCV 88.6 87.8 88.3  MCH 28.2 28.1 28.1  MCHC 31.9 32.0 31.9  RDW 16.3* 16.3* 16.2*  PLT 262 280 277    Cardiac Enzymes Recent Labs  Lab 06/04/18 1428  TROPONINI <0.03   No results for input(s): TROPIPOC in the last 168 hours.   BNPNo results for input(s): BNP, PROBNP in the last 168 hours.   DDimer No results for input(s): DDIMER in the last 168 hours.   Radiology    No results found.  Cardiac Studies   None this admission  Patient Profile     52 y.o. female with a nonischemic cardiomyopathy ejection fraction 20 to 25% in the past, normal coronary arteries on cath 07/2016, history of CVA on Plavix and aspirin, hypertension, CKD stage III, DM, history of polysubstance abuse with EtOH and cocainehospitalized with urosepsis whom we are asked to evaluate for atrial tachyarrhythmias.  Assessment & Plan    1. Rapid atrial fibrillation/atrial tachycardia: She is currently in sinus rhythm. She has had recurrent atrial tachycardia, for which EP evaluation was recommended in 2018 but she never  pursued it. I will arrange for outpatient follow up with EP to assess ablation candidacy. I will discontinue IV amiodarone and start oral 200 mg daily.  I switched carvedilol to Toprol-XL in the hopes of achieving better rate control with less effects on BP on 8/13. I will continue 50 mg bid. I started apixaban 2.5 mg bid on 8/12. As creatinine continues to improve, I will increase to 5 mg bid.  2. Nonischemic cardiomyopathy/chronic systolic heart failure: Does not appear decompensated. Continue Toprol XL.   3. HTN: BP stable. No changes to therapy.  4. Acute on chronic renal failure/CKD stage 3: SCr down to 1.43.  5. Urosepsis: WBC's increasing from yesterday, up to 22.5. Blood cultures normal. UC positive for pan-sensitive E. Coli. Afebrile. Currently on ceftriaxone. Will defer management to internal medicine.  6. History of CVA: On ASA. I dc Plavix and started apixaban 2.5 mg bid given atrial fib on 8/12. On Crestor at home. I will increase apixaban to 5 mg bid as creatinine continues to improve.   [facility] will sign off.   Medication Recommendations:  Continue amiodarone 200 mg daily, Toprol XL 50 mg bid, and apixaban 5 mg bid. Other recommendations (labs, testing, etc):  None Follow up as an outpatient:  Outpatient follow up with EP which we will arrange.  For questions or updates, please contact [place] Please consult [email or web address] for contact info under Cardiology/STEMI.      Signed, Kate Sable, MD  06/09/2018, 8:50 AM

## 2018-06-09 NOTE — Progress Notes (Signed)
PROGRESS NOTE                                                                                                                                                                                                             Patient Demographics:    [name], is a 52 y.o. female, DOB - 02-26-1966, GMW:[id number]  Admit date - 06/04/2018   Admitting Physician Phillips Grout, MD  Outpatient Primary MD for the patient is [name], MD  LOS - 5  Outpatient Specialists: none  Chief Complaint  Patient presents with  . Constipation       Brief Narrative   52 year old female with history of  COPD, nonischemic cardiomyopathy, chronic kidney disease stage III, paroxysmal atrial tachycardia, presented to the hospital with generalized weakness for past several associated with abdominal cramping and decreased urine output.  She was found to have ATN, was hypotensive and tachycardic on presentation. She was also septic with Escherichia coli UTI and possible pneumonia. Hospital course prolonged with persistent A. Fib requiring amiodarone drip.   Subjective:   Heart rate overall improved, was elevated to 130s yesterday when family visited her.  Reports that she was stressed seeing her grandchildren coming to see her.  Had low urine output after Foley removed yesterday and had gross hematuria this morning.  Complains of some lower abdominal pain.   Assessment  & Plan :   Principal problem Atrial tachycardia/A. Fib with RVR ([place]) Persistent without improvement with Cardizem drip(was discontinued on 8/12 given LV dysfunction).  Required amiodarone drip and heart rate now much improved.  Transition to p.o. amiodarone by cardiology today.  Coreg switched to Toprol and dose increased. 2D echo with EF of 30-35%. Electrolytes stable.  Patient started on Eliquis.( CHADS2 vasc of 6).   Active problems Sepsis ([place]) Secondary to  pansensitive Escherichia coli UTI. Continue empiric Rocephin.  May dynamically stable but has progressively elevated WBC, 22.5K today.  On empiric Rocephin. WBC elevated to 15.2 but remains afebrile. Continue on current antibiotics.  Check repeat blood culture and chest x-ray. Patient has been complaining of abdominal pain.  If x-ray and blood culture negative I will obtain CT of the abdomen without contrast.  Acute kidney injury on chronic kidney disease stage III(HCC) Renal function improving to baseline. ATN likely prerenal with dehydration. Avoid nephrotoxins. Nephrology consult  appreciated.  Place her on gentle hydration.   Acute respiratory failure with hypoxia(HCC) Persistent and requiring 2.5 L oxygen. Wean as tolerated.  No signs of volume overload.  Gross hematuria. Noted this a.m.  Patient was having very low urine output yesterday after Foley discontinued.  Check UA. Renal ultrasound on admission showing medical renal disease only.  If chest x-ray and blood culture negative for infection will obtain CT of the abdomen and pelvis.  Nonischemic cardiomyopathy with chronic systolic and diastolic CHF Euvolemic. Toprol dose increased and heart rate much better controlled.  Hyperkalemia Secondary to ATN.  Hypokalemic today.  Will replenish.  History of CVA Patient was on aspirin and Plavix. Started on eliquis and Plavix discontinued.  Insulin-dependent diabetes mellitus Continue Levemir with sliding scale coverage. lingliptin on hold.   Essential hypertension Stable.  Chronic back pain Reports taking tramadol at home. Resumed.  COPD Stable.  On O2.  Continue as needed inhalers.    Code Status : full code  Family Communication  : none at bedside  Disposition Plan  : pending clinical improvement, remains inpatient for now  Barriers For Discharge : active symptoms  Consults  : cardiology/renal  Procedures  : 2-D echo, renal ultrasound  DVT Prophylaxis  :  Lovenox  -  Lab Results  Component Value Date   PLT 277 06/09/2018    Antibiotics  :    Anti-infectives (From admission, onward)   Start     Dose/Rate Route Frequency Ordered Stop   06/07/18 1500  cefTRIAXone (ROCEPHIN) 1 g in sodium chloride 0.9 % 100 mL IVPB     1 g 200 mL/hr over 30 Minutes Intravenous Every 24 hours 06/07/18 1041     06/04/18 1930  azithromycin (ZITHROMAX) 500 mg in sodium chloride 0.9 % 250 mL IVPB  Status:  Discontinued     500 mg 250 mL/hr over 60 Minutes Intravenous Every 24 hours 06/04/18 1622 06/07/18 1027   06/04/18 1630  cefTRIAXone (ROCEPHIN) 1 g in sodium chloride 0.9 % 100 mL IVPB  Status:  Discontinued     1 g 200 mL/hr over 30 Minutes Intravenous Every 24 hours 06/04/18 1622 06/05/18 0844   06/04/18 1530  azithromycin (ZITHROMAX) 500 mg in sodium chloride 0.9 % 250 mL IVPB     500 mg 250 mL/hr over 60 Minutes Intravenous  Once 06/04/18 1523 06/04/18 1759   06/04/18 1500  cefTRIAXone (ROCEPHIN) 2 g in sodium chloride 0.9 % 100 mL IVPB  Status:  Discontinued     2 g 200 mL/hr over 30 Minutes Intravenous Every 24 hours 06/04/18 1456 06/07/18 1041        Objective:   Vitals:   06/09/18 0815 06/09/18 0830 06/09/18 0845 06/09/18 0853  BP: (!) 137/93 137/88 126/89   Pulse:  79 79   Resp: (!) 21 20 20    Temp:      TempSrc:      SpO2:  94% 96% 99%  Weight:      Height:        Wt Readings from Last 3 Encounters:  06/09/18 76.6 kg  05/26/18 69 kg  05/05/18 74.4 kg     Intake/Output Summary (Last 24 hours) at 06/09/2018 0958 Last data filed at 06/09/2018 0800 Gross per 24 hour  Intake 484.1 ml  Output 1251 ml  Net -766.9 ml    Physical exam Not in distress HEENT: Moist mucosa, supple neck Chest: Clear bilaterally CVS: S1 and S2 irregular, no murmurs GI: Soft, nondistended,, bowel  sounds present, mild left lower quadrant tenderness Musculoskeletal: Warm, no edema      Data Review:    CBC Recent Labs  Lab 06/04/18 1428 06/05/18 0419  06/06/18 0429 06/07/18 0348 06/08/18 0408 06/09/18 0634  WBC 10.2 8.7 10.8* 13.5* 15.2* 22.5*  HGB 11.4* 9.3* 9.1* 10.9* 10.4* 10.3*  HCT 35.3* 29.1* 29.3* 34.2* 32.5* 32.3*  PLT 358 297 279 262 280 277  MCV 89.1 87.9 88.8 88.6 87.8 88.3  MCH 28.8 28.1 27.6 28.2 28.1 28.1  MCHC 32.3 32.0 31.1 31.9 32.0 31.9  RDW 16.0* 16.1* 16.3* 16.3* 16.3* 16.2*  LYMPHSABS 0.6* 0.6*  --   --   --   --   MONOABS 0.2 0.3  --   --   --   --   EOSABS 0.0 0.1  --   --   --   --   BASOSABS 0.0 0.0  --   --   --   --     Chemistries  Recent Labs  Lab 06/04/18 1428  06/05/18 0419 06/06/18 0429 06/07/18 0348 06/08/18 0408 06/09/18 0634  NA 135   < > 138 136 138 139 139  K 6.6*   < > 6.0* 4.8 4.3 3.5 3.1*  CL 101   < > 106 107 107 106 105  CO2 16*   < > 18* 21* 22 22 21*  GLUCOSE 175*   < > 203* 180* 178* 114* 135*  BUN 106*   < > 94* 79* 78* 57* 43*  CREATININE 6.85*   < > 5.32* 3.23* 2.47* 1.65* 1.43*  CALCIUM 9.2   < > 8.1* 8.6* 9.0 8.6* 8.6*  MG  --   --   --   --  2.9*  --   --   AST 14*  --   --   --   --   --   --   ALT 10  --   --   --   --   --   --   ALKPHOS 87  --   --   --   --   --   --   BILITOT 1.0  --   --   --   --   --   --    < > = values in this interval not displayed.   ------------------------------------------------------------------------------------------------------------------ No results for input(s): CHOL, HDL, LDLCALC, TRIG, CHOLHDL, LDLDIRECT in the last 72 hours.  Lab Results  Component Value Date   HGBA1C 7.2 (H) 02/09/2018   ------------------------------------------------------------------------------------------------------------------ No results for input(s): TSH, T4TOTAL, T3FREE, THYROIDAB in the last 72 hours.  Invalid input(s): FREET3 ------------------------------------------------------------------------------------------------------------------ No results for input(s): VITAMINB12, FOLATE, FERRITIN, TIBC, IRON, RETICCTPCT in the last 72  hours.  Coagulation profile Recent Labs  Lab 06/04/18 1428  INR 1.41    No results for input(s): DDIMER in the last 72 hours.  Cardiac Enzymes Recent Labs  Lab 06/04/18 1428  TROPONINI <0.03   ------------------------------------------------------------------------------------------------------------------    Component Value Date/Time   BNP 835.0 (H) 06/22/2017 0337    Inpatient Medications  Scheduled Meds: . amiodarone  200 mg Oral Daily  . apixaban  5 mg Oral BID  . aspirin EC  81 mg Oral Daily  . folic acid  1 mg Oral Daily  . insulin aspart  0-15 Units Subcutaneous TID WC  . insulin aspart  0-5 Units Subcutaneous QHS  . insulin aspart  3 Units Subcutaneous TID WC  . insulin detemir  10 Units Subcutaneous  QHS  . metoprolol succinate  50 mg Oral BID  . multivitamin with minerals  1 tablet Oral Daily  . thiamine  100 mg Oral Daily   Or  . thiamine  100 mg Intravenous Daily  . tiotropium  18 mcg Inhalation Daily   Continuous Infusions: . 0.45 % NaCl with KCl 20 mEq / L 75 mL/hr at 06/09/18 0933  . cefTRIAXone (ROCEPHIN)  IV Stopped (06/08/18 1504)  . sodium chloride     PRN Meds:.ipratropium-albuterol, traMADol  Micro Results Recent Results (from the past 240 hour(s))  Urine Culture     Status: Abnormal   Collection Time: 06/04/18  2:07 PM  Result Value Ref Range Status   Specimen Description   Final    URINE, CLEAN CATCH Performed at [facility], [address], [place], [place] [address]    Special Requests   Final    NONE Performed at [facility], [address], [place], [place] [address]    Culture >=100,000 COLONIES/mL ESCHERICHIA COLI (A)  Final   Report Status 06/07/2018 FINAL  Final   Organism ID, Bacteria ESCHERICHIA COLI (A)  Final      Susceptibility   Escherichia coli - MIC*    AMPICILLIN 4 SENSITIVE Sensitive     CEFAZOLIN <=4 SENSITIVE Sensitive     CEFTRIAXONE <=1 SENSITIVE Sensitive     CIPROFLOXACIN <=0.25 SENSITIVE Sensitive      GENTAMICIN <=1 SENSITIVE Sensitive     IMIPENEM <=0.25 SENSITIVE Sensitive     NITROFURANTOIN <=16 SENSITIVE Sensitive     TRIMETH/SULFA <=20 SENSITIVE Sensitive     AMPICILLIN/SULBACTAM <=2 SENSITIVE Sensitive     PIP/TAZO <=4 SENSITIVE Sensitive     Extended ESBL NEGATIVE Sensitive     * >=100,000 COLONIES/mL ESCHERICHIA COLI  Blood Culture (routine x 2)     Status: None   Collection Time: 06/04/18  2:47 PM  Result Value Ref Range Status   Specimen Description BLOOD RIGHT HAND  Final   Special Requests   Final    BOTTLES DRAWN AEROBIC ONLY Blood Culture adequate volume   Culture   Final    NO GROWTH 5 DAYS Performed at [facility], [address] [place], [place] [address]    Report Status 06/09/2018 FINAL  Final  Blood Culture (routine x 2)     Status: None   Collection Time: 06/04/18  2:55 PM  Result Value Ref Range Status   Specimen Description RIGHT ANTECUBITAL  Final   Special Requests   Final    BOTTLES DRAWN AEROBIC AND ANAEROBIC Blood Culture adequate volume   Culture   Final    NO GROWTH 5 DAYS Performed at [facility], [address] [place], [place] [address]    Report Status 06/09/2018 FINAL  Final  MRSA PCR Screening     Status: None   Collection Time: 06/04/18  5:52 PM  Result Value Ref Range Status   MRSA by PCR NEGATIVE NEGATIVE Final    Comment:        The GeneXpert MRSA Assay (FDA approved for NASAL specimens only), is one component of a comprehensive MRSA colonization surveillance program. It is not intended to diagnose MRSA infection nor to guide or monitor treatment for MRSA infections. Performed at [facility], [address] [place], [place] [address]     Radiology Reports US Renal  Result Date: 06/05/2018 CLINICAL DATA:  Acute kidney injury EXAM: RENAL / URINARY TRACT ULTRASOUND COMPLETE COMPARISON:  None. FINDINGS: Right Kidney: Length: 9.3  cm.  Echogenic renal parenchyma.  No hydronephrosis. Left Kidney: Length: 9.9 cm.  Echogenic  renal parenchyma.  No hydronephrosis. Bladder: Not discretely visualized. IMPRESSION: Echogenic renal parenchyma bilaterally, suggesting medical renal disease. No hydronephrosis. Electronically Signed   By: Julian Hy M.D.   On: 06/05/2018 12:44   Dg Chest Port 1 View  Result Date: 06/05/2018 CLINICAL DATA:  Shortness of breath, generalized weakness EXAM: PORTABLE CHEST 1 VIEW COMPARISON:  06/04/2018 FINDINGS: Low lung volumes. Cardiomegaly with pulmonary vascular congestion and possible mild perihilar edema. Mild patchy bilateral lower lobe opacities, likely atelectasis. No definite pleural effusions. No pneumothorax. IMPRESSION: Cardiomegaly with possible mild perihilar edema. Mild patchy bilateral lower lobe opacities, likely atelectasis. Electronically Signed   By: Julian Hy M.D.   On: 06/05/2018 12:43   Dg Chest Port 1 View  Result Date: 06/04/2018 CLINICAL DATA:  Tachycardia, upper abdomen pain and constipation for 5 days. EXAM: PORTABLE CHEST 1 VIEW COMPARISON:  June 30, 2017 FINDINGS: The mediastinal contour is normal. Heart size is enlarged. Consolidation of right lung base is identified. There is mild central pulmonary vascular congestion. There is elevation of the right hemidiaphragm unchanged. The visualized skeletal structures are stable. IMPRESSION: Right lung base pneumonia.  Central pulmonary vascular congestion. Electronically Signed   By: Abelardo Diesel M.D.   On: 06/04/2018 14:43    Time Spent in minutes  35  Arlie Riker M.D on 06/09/2018 at 9:58 AM  Between 7am to 7pm - Pager - [phone]  After 7pm go to [email or web address] - password [facility]  [facility] -  Office  [phone]

## 2018-06-10 ENCOUNTER — Inpatient Hospital Stay (HOSPITAL_COMMUNITY): Payer: Medicare Other

## 2018-06-10 DIAGNOSIS — N3000 Acute cystitis without hematuria: Secondary | ICD-10-CM

## 2018-06-10 DIAGNOSIS — I4891 Unspecified atrial fibrillation: Secondary | ICD-10-CM

## 2018-06-10 DIAGNOSIS — R198 Other specified symptoms and signs involving the digestive system and abdomen: Secondary | ICD-10-CM

## 2018-06-10 DIAGNOSIS — R6889 Other general symptoms and signs: Secondary | ICD-10-CM

## 2018-06-10 DIAGNOSIS — D72829 Elevated white blood cell count, unspecified: Secondary | ICD-10-CM

## 2018-06-10 LAB — RENAL FUNCTION PANEL
Albumin: 1.8 g/dL — ABNORMAL LOW (ref 3.5–5.0)
Anion gap: 11 (ref 5–15)
BUN: 37 mg/dL — ABNORMAL HIGH (ref 6–20)
CO2: 23 mmol/L (ref 22–32)
Calcium: 8.8 mg/dL — ABNORMAL LOW (ref 8.9–10.3)
Chloride: 107 mmol/L (ref 98–111)
Creatinine, Ser: 1.31 mg/dL — ABNORMAL HIGH (ref 0.44–1.00)
GFR calc Af Amer: 52 mL/min — ABNORMAL LOW (ref >60)
GFR calc non Af Amer: 45 mL/min — ABNORMAL LOW (ref >60)
Glucose, Bld: 168 mg/dL — ABNORMAL HIGH (ref 70–99)
Phosphorus: 3.1 mg/dL (ref 2.5–4.6)
Potassium: 3.5 mmol/L (ref 3.5–5.1)
Sodium: 141 mmol/L (ref 135–145)

## 2018-06-10 LAB — CBC
HCT: 29.6 % — ABNORMAL LOW (ref 36.0–46.0)
Hemoglobin: 9.3 g/dL — ABNORMAL LOW (ref 12.0–15.0)
MCH: 27.8 pg (ref 26.0–34.0)
MCHC: 31.4 g/dL (ref 30.0–36.0)
MCV: 88.6 fL (ref 78.0–100.0)
Platelets: 293 10*3/uL (ref 150–400)
RBC: 3.34 MIL/uL — ABNORMAL LOW (ref 3.87–5.11)
RDW: 16.1 % — ABNORMAL HIGH (ref 11.5–15.5)
WBC: 23.6 10*3/uL — ABNORMAL HIGH (ref 4.0–10.5)

## 2018-06-10 LAB — GLUCOSE, CAPILLARY
Glucose-Capillary: 157 mg/dL — ABNORMAL HIGH (ref 70–99)
Glucose-Capillary: 136 mg/dL — ABNORMAL HIGH (ref 70–99)
Glucose-Capillary: 88 mg/dL (ref 70–99)
Glucose-Capillary: 88 mg/dL (ref 70–99)

## 2018-06-10 MED ORDER — IOPAMIDOL (ISOVUE-300) INJECTION 61%
30.0000 mL | Freq: Once | INTRAVENOUS | Status: AC | PRN
  Administered 2018-06-10: 30 mL via ORAL

## 2018-06-10 MED ORDER — POLYETHYLENE GLYCOL 3350 17 G PO PACK
17.0000 g | PACK | Freq: Two times a day (BID) | ORAL | Status: DC
  Administered 2018-06-10 – 2018-06-14 (×5): 17 g via ORAL
  Filled 2018-06-10 (×16): qty 1

## 2018-06-10 MED ORDER — PIPERACILLIN-TAZOBACTAM 3.375 G IVPB
3.3750 g | Freq: Three times a day (TID) | INTRAVENOUS | Status: DC
  Administered 2018-06-10 – 2018-06-16 (×18): 3.375 g via INTRAVENOUS
  Filled 2018-06-10 (×18): qty 50

## 2018-06-10 NOTE — Progress Notes (Addendum)
Patient ID: [name], female   DOB: 1966/09/05, 52 y.o.   MRN: [id number]   Dr Clementeen Graham requesting review of CT imaging IR possible aspiration/drain of mesentery fluid collection  Discussed with Dr Anselm Pancoast He has reviewed imaging  CT 1. Wall thickening in the urinary bladder suggesting cystitis. 2. There appears to be a diverticulum along the roof of the urinary bladder. There is also an adjacent 140 cubic cm loculated fluid collection along the central mesentery adjacent to the urinary bladder and a smaller loculated fluid collection eccentric to the right in the pelvic mesentery. The loculated appearing nature of these collections is of uncertain significance but infection is not entirely excluded. 3. Third spacing of fluid with small pleural effusions, subcutaneous edema, mesenteric edema, and ascites   Feels NO role for IR No window for access to collection Consider Urology consult; Cystoscopy  Dr Clementeen Graham aware  .

## 2018-06-10 NOTE — Progress Notes (Signed)
Subjective: Interval History: Patient complains of constipation otherwise feels okay.  She denies any difficulty breathing.  Objective: Vital signs in last 24 hours: Temp:  [97.5 F (36.4 C)-99.2 F (37.3 C)] 98.2 F (36.8 C) (08/16 0400) Pulse Rate:  [80-93] 89 (08/16 0300) Resp:  [15-30] 23 (08/16 0300) BP: (128-154)/(85-110) 146/110 (08/16 0300) SpO2:  [81 %-100 %] 96 % (08/16 0300) Weight:  [76.8 kg] 76.8 kg (08/16 0500) Weight change: 0.2 kg  Intake/Output from previous day: 08/15 0701 - 08/16 0700 In: 1881.3 [I.V.:1681.3; IV Piggyback:200] Out: 375 [Urine:375] Intake/Output this shift: No intake/output data recorded.  General appearance: alert, cooperative and no distress Resp: clear to auscultation bilaterally Cardio: regular rate and rhythm GI: soft, non-tender; bowel sounds normal; no masses,  no organomegaly Extremities: No edema  Lab Results: Recent Labs    06/09/18 0634 06/10/18 0624  WBC 22.5* 23.6*  HGB 10.3* 9.3*  HCT 32.3* 29.6*  PLT 277 293   BMET:  Recent Labs    06/09/18 0634 06/10/18 0624  NA 139 141  K 3.1* 3.5  CL 105 107  CO2 21* 23  GLUCOSE 135* 168*  BUN 43* 37*  CREATININE 1.43* 1.31*  CALCIUM 8.6* 8.8*   No results for input(s): PTH in the last 72 hours. Iron Studies: No results for input(s): IRON, TIBC, TRANSFERRIN, FERRITIN in the last 72 hours.  Studies/Results: Dg Chest Port 1 View  Result Date: 06/09/2018 CLINICAL DATA:  Leukocytosis EXAM: PORTABLE CHEST 1 VIEW COMPARISON:  06/05/2018, 06/04/2018, 06/30/2017 FINDINGS: Low lung volumes. Slightly improved aeration of the bases with mild residual airspace disease. Stable cardiomegaly. Decreased central vascular congestion. No pneumothorax. IMPRESSION: 1. Cardiomegaly with improved vascular congestion 2. Improved aeration at the bilateral lung bases with minimal residual airspace disease, likely atelectasis. 3. No new focal pulmonary opacity. Electronically Signed   By: Donavan Foil M.D.   On: 06/09/2018 14:26    I have reviewed the patient's current medications.  Assessment/Plan: 1] acute on chronic kidney injury.  Patient denies any nausea or vomiting.  Her renal function continue to improve presently has returned to her baseline. 2] hypokalemia: Patient on potassium supplement and her potassium is normal. 3] sepsis: Thought to be secondary to UTI and pneumonia.  Patient progressively improving. 4] diabetes: Her blood sugar is reasonably controlled 5] bone and mineral disorder: Her calcium and phosphorus is a range 6] anemia: Her hemoglobin is within our target goal. Plan: 1] we will DC IV fluid with potassium. 2] since her renal function has improved and returned to her baseline I will sign off.  Thank you for letting me participate in her care.  I will see patient in 4 weeks as an outpatient after she is discharged.    LOS: 6 days   Stefan Markarian S 06/10/2018,8:48 AM

## 2018-06-10 NOTE — Care Management (Signed)
Patient new to Eliquis. 30 day free coupon given.

## 2018-06-10 NOTE — Progress Notes (Addendum)
PROGRESS NOTE                                                                                                                                                                                                             Patient Demographics:    [name], is a 52 y.o. female, DOB - [birth year], XQJ:[id number]  Admit date - 06/04/2018   Admitting Physician Phillips Grout, MD  Outpatient Primary MD for the patient is [name], MD  LOS - 6  Outpatient Specialists: none  Chief Complaint  Patient presents with  . Constipation       Brief Narrative   52 year old female with history of  COPD, nonischemic cardiomyopathy, chronic kidney disease stage III, paroxysmal atrial tachycardia, presented to the hospital with generalized weakness for past several associated with abdominal cramping and decreased urine output.  She was found to have ATN, was hypotensive and tachycardic on presentation. She was also septic with Escherichia coli UTI and possible pneumonia. Hospital course prolonged with persistent A. Fib requiring amiodarone drip.   Subjective:   Heart rate has remained stable past 24 hours.  Still complaining of lower abdominal discomfort and not having bowel movement for several days.   Assessment  & Plan :   Principal problem Atrial tachycardia/A. Fib with RVR (HCC) Required amiodarone drip, now switched to p.o. Coreg switched to Toprol-XL and dose adjusted.  Heart rate remains stable.   2D echo with EF of 30-35%. Electrolytes stable.  Patient started on Eliquis.( CHADS2 vasc of 6).   Active problems Sepsis ([place]) Secondary to pansensitive Escherichia coli UTI.  On empiric Rocephin.  Now has persistently elevated WBC 20 3K today.  Remains afebrile but complains of lower abdominal pain and constipation. Chest x-ray and repeat blood culture unremarkable. - will obtain CT of the abdomen and pelvis without  contrast.  Cystitis with left pelvic fluid collection CT abdomen findings reviewed and discussed with IR and urology on call.  Placed Foley after discussion with urology and escalated antibiotics to empiric Zosyn.  Fall on 8/16. Patient fell on the floor while at the CT and hit her head this afternoon.  No loss of consciousness or injury sustained.  Head CT done since patient is on anticoagulation and was negative for bleeding or acute findings.  Acute kidney injury on chronic kidney disease  stage III(HCC) Renal function improving to baseline. ATN likely prerenal with dehydration.  Renal consult appreciated.  Discontinued IV fluids today.   Acute respiratory failure with hypoxia(HCC) Persistent and requiring 2.5 L oxygen. Wean as tolerated.  No signs of volume overload.  Gross hematuria. Noted on 8/15.  No further symptoms. Renal ultrasound on admission showing medical renal disease only.  UA on admission showed hematuria.  Follow CT abdomen.  Nonischemic cardiomyopathy with chronic systolic and diastolic CHF Euvolemic. Toprol dose increased and heart rate much better controlled.  Hypokalemia Replenished  History of CVA Patient was on aspirin and Plavix. Started on eliquis and Plavix discontinued.  Insulin-dependent diabetes mellitus Continue Levemir with sliding scale coverage. lingliptin on hold.   Essential hypertension Stable.  Chronic back pain Reports taking tramadol at home. Resumed.  COPD Stable.  On O2.  Continue as needed inhalers.  Constipation Add MiraLAX.  Code Status : full code  Family Communication  : none at bedside  Disposition Plan  : Transfer to telemetry.  PT eval in a.m.  Possibly discharge in the next 48 hours if no signs of further infection and WBC improved  Barriers For Discharge : active symptoms  Consults  : cardiology/renal  Procedures  : 2-D echo, renal ultrasound CT abdomen and pelvis  DVT Prophylaxis  :  Lovenox -  Lab Results    Component Value Date   PLT 293 06/10/2018    Antibiotics  :    Anti-infectives (From admission, onward)   Start     Dose/Rate Route Frequency Ordered Stop   06/07/18 1500  cefTRIAXone (ROCEPHIN) 1 g in sodium chloride 0.9 % 100 mL IVPB     1 g 200 mL/hr over 30 Minutes Intravenous Every 24 hours 06/07/18 1041     06/04/18 1930  azithromycin (ZITHROMAX) 500 mg in sodium chloride 0.9 % 250 mL IVPB  Status:  Discontinued     500 mg 250 mL/hr over 60 Minutes Intravenous Every 24 hours 06/04/18 1622 06/07/18 1027   06/04/18 1630  cefTRIAXone (ROCEPHIN) 1 g in sodium chloride 0.9 % 100 mL IVPB  Status:  Discontinued     1 g 200 mL/hr over 30 Minutes Intravenous Every 24 hours 06/04/18 1622 06/05/18 0844   06/04/18 1530  azithromycin (ZITHROMAX) 500 mg in sodium chloride 0.9 % 250 mL IVPB     500 mg 250 mL/hr over 60 Minutes Intravenous  Once 06/04/18 1523 06/04/18 1759   06/04/18 1500  cefTRIAXone (ROCEPHIN) 2 g in sodium chloride 0.9 % 100 mL IVPB  Status:  Discontinued     2 g 200 mL/hr over 30 Minutes Intravenous Every 24 hours 06/04/18 1456 06/07/18 1041        Objective:   Vitals:   06/10/18 0300 06/10/18 0400 06/10/18 0500 06/10/18 1000  BP: (!) 146/110   (!) 128/103  Pulse: 89     Resp: (!) 23   18  Temp:  98.2 F (36.8 C)    TempSrc:  Oral    SpO2: 96%     Weight:   76.8 kg   Height:        Wt Readings from Last 3 Encounters:  06/10/18 76.8 kg  05/26/18 69 kg  05/05/18 74.4 kg     Intake/Output Summary (Last 24 hours) at 06/10/2018 1047 Last data filed at 06/10/2018 0500 Gross per 24 hour  Intake 1468.75 ml  Output 375 ml  Net 1093.75 ml    Physical exam Not in distress HEENT: Moist  mucosa, supple neck Chest: Clear bilaterally CVS: S1 and S2 irregular, no murmurs GI: Soft, nondistended,, bowel sounds present, mild left lower quadrant tenderness Musculoskeletal: Warm, no edema      Data Review:    CBC Recent Labs  Lab 06/04/18 1428  06/05/18 0419 06/06/18 0429 06/07/18 0348 06/08/18 0408 06/09/18 0634 06/10/18 0624  WBC 10.2 8.7 10.8* 13.5* 15.2* 22.5* 23.6*  HGB 11.4* 9.3* 9.1* 10.9* 10.4* 10.3* 9.3*  HCT 35.3* 29.1* 29.3* 34.2* 32.5* 32.3* 29.6*  PLT 358 297 279 262 280 277 293  MCV 89.1 87.9 88.8 88.6 87.8 88.3 88.6  MCH 28.8 28.1 27.6 28.2 28.1 28.1 27.8  MCHC 32.3 32.0 31.1 31.9 32.0 31.9 31.4  RDW 16.0* 16.1* 16.3* 16.3* 16.3* 16.2* 16.1*  LYMPHSABS 0.6* 0.6*  --   --   --   --   --   MONOABS 0.2 0.3  --   --   --   --   --   EOSABS 0.0 0.1  --   --   --   --   --   BASOSABS 0.0 0.0  --   --   --   --   --     Chemistries  Recent Labs  Lab 06/04/18 1428  06/06/18 0429 06/07/18 0348 06/08/18 0408 06/09/18 0634 06/10/18 0624  NA 135   < > 136 138 139 139 141  K 6.6*   < > 4.8 4.3 3.5 3.1* 3.5  CL 101   < > 107 107 106 105 107  CO2 16*   < > 21* 22 22 21* 23  GLUCOSE 175*   < > 180* 178* 114* 135* 168*  BUN 106*   < > 79* 78* 57* 43* 37*  CREATININE 6.85*   < > 3.23* 2.47* 1.65* 1.43* 1.31*  CALCIUM 9.2   < > 8.6* 9.0 8.6* 8.6* 8.8*  MG  --   --   --  2.9*  --   --   --   AST 14*  --   --   --   --   --   --   ALT 10  --   --   --   --   --   --   ALKPHOS 87  --   --   --   --   --   --   BILITOT 1.0  --   --   --   --   --   --    < > = values in this interval not displayed.   ------------------------------------------------------------------------------------------------------------------ No results for input(s): CHOL, HDL, LDLCALC, TRIG, CHOLHDL, LDLDIRECT in the last 72 hours.  Lab Results  Component Value Date   HGBA1C 7.2 (H) 02/09/2018   ------------------------------------------------------------------------------------------------------------------ No results for input(s): TSH, T4TOTAL, T3FREE, THYROIDAB in the last 72 hours.  Invalid input(s): FREET3 ------------------------------------------------------------------------------------------------------------------ No results  for input(s): VITAMINB12, FOLATE, FERRITIN, TIBC, IRON, RETICCTPCT in the last 72 hours.  Coagulation profile Recent Labs  Lab 06/04/18 1428  INR 1.41    No results for input(s): DDIMER in the last 72 hours.  Cardiac Enzymes Recent Labs  Lab 06/04/18 1428  TROPONINI <0.03   ------------------------------------------------------------------------------------------------------------------    Component Value Date/Time   BNP 835.0 (H) 06/22/2017 0337    Inpatient Medications  Scheduled Meds: . amiodarone  200 mg Oral Daily  . apixaban  5 mg Oral BID  . aspirin EC  81 mg Oral Daily  . folic acid  1 mg  Oral Daily  . insulin aspart  0-15 Units Subcutaneous TID WC  . insulin aspart  0-5 Units Subcutaneous QHS  . insulin aspart  3 Units Subcutaneous TID WC  . insulin detemir  10 Units Subcutaneous QHS  . metoprolol succinate  50 mg Oral BID  . multivitamin with minerals  1 tablet Oral Daily  . thiamine  100 mg Oral Daily   Or  . thiamine  100 mg Intravenous Daily  . tiotropium  18 mcg Inhalation Daily   Continuous Infusions: . cefTRIAXone (ROCEPHIN)  IV Stopped (06/09/18 1522)  . sodium chloride     PRN Meds:.ipratropium-albuterol, traMADol  Micro Results Recent Results (from the past 240 hour(s))  Urine Culture     Status: Abnormal   Collection Time: 06/04/18  2:07 PM  Result Value Ref Range Status   Specimen Description   Final    URINE, CLEAN CATCH Performed at [facility], [address], [place], [place] [address]    Special Requests   Final    NONE Performed at [facility], [address] [place], [place] [address]    Culture >=100,000 COLONIES/mL ESCHERICHIA COLI (A)  Final   Report Status 06/07/2018 FINAL  Final   Organism ID, Bacteria ESCHERICHIA COLI (A)  Final      Susceptibility   Escherichia coli - MIC*    AMPICILLIN 4 SENSITIVE Sensitive     CEFAZOLIN <=4 SENSITIVE Sensitive     CEFTRIAXONE <=1 SENSITIVE Sensitive     CIPROFLOXACIN <=0.25  SENSITIVE Sensitive     GENTAMICIN <=1 SENSITIVE Sensitive     IMIPENEM <=0.25 SENSITIVE Sensitive     NITROFURANTOIN <=16 SENSITIVE Sensitive     TRIMETH/SULFA <=20 SENSITIVE Sensitive     AMPICILLIN/SULBACTAM <=2 SENSITIVE Sensitive     PIP/TAZO <=4 SENSITIVE Sensitive     Extended ESBL NEGATIVE Sensitive     * >=100,000 COLONIES/mL ESCHERICHIA COLI  Blood Culture (routine x 2)     Status: None   Collection Time: 06/04/18  2:47 PM  Result Value Ref Range Status   Specimen Description BLOOD RIGHT HAND  Final   Special Requests   Final    BOTTLES DRAWN AEROBIC ONLY Blood Culture adequate volume   Culture   Final    NO GROWTH 5 DAYS Performed at [facility], [address] [place], [place] [address]    Report Status 06/09/2018 FINAL  Final  Blood Culture (routine x 2)     Status: None   Collection Time: 06/04/18  2:55 PM  Result Value Ref Range Status   Specimen Description RIGHT ANTECUBITAL  Final   Special Requests   Final    BOTTLES DRAWN AEROBIC AND ANAEROBIC Blood Culture adequate volume   Culture   Final    NO GROWTH 5 DAYS Performed at [facility], [address] [place], [place] [address]    Report Status 06/09/2018 FINAL  Final  MRSA PCR Screening     Status: None   Collection Time: 06/04/18  5:52 PM  Result Value Ref Range Status   MRSA by PCR NEGATIVE NEGATIVE Final    Comment:        The GeneXpert MRSA Assay (FDA approved for NASAL specimens only), is one component of a comprehensive MRSA colonization surveillance program. It is not intended to diagnose MRSA infection nor to guide or monitor treatment for MRSA infections. Performed at [facility], [address] [place], [place] [address]   Culture, blood (routine x 2)  Status: None (Preliminary result)   Collection Time: 06/09/18  8:51 AM  Result Value Ref Range Status   Specimen Description LEFT ANTECUBITAL DRAWN BY RN  Final   Special Requests   Final    BOTTLES DRAWN AEROBIC AND ANAEROBIC Blood  Culture adequate volume Performed at [facility], [address], [place], [place] [address]    Culture PENDING  Incomplete   Report Status PENDING  Incomplete  Culture, blood (routine x 2)     Status: None (Preliminary result)   Collection Time: 06/09/18  8:56 AM  Result Value Ref Range Status   Specimen Description BLOOD LEFT HAND DRAWN BY RN  Final   Special Requests   Final    BOTTLES DRAWN AEROBIC AND ANAEROBIC Blood Culture adequate volume Performed at [facility], [address] [place], [place] [address]    Culture PENDING  Incomplete   Report Status PENDING  Incomplete    Radiology Reports US Renal  Result Date: 06/05/2018 CLINICAL DATA:  Acute kidney injury EXAM: RENAL / URINARY TRACT ULTRASOUND COMPLETE COMPARISON:  None. FINDINGS: Right Kidney: Length: 9.3 cm.  Echogenic renal parenchyma.  No hydronephrosis. Left Kidney: Length: 9.9 cm.  Echogenic renal parenchyma.  No hydronephrosis. Bladder: Not discretely visualized. IMPRESSION: Echogenic renal parenchyma bilaterally, suggesting medical renal disease. No hydronephrosis. Electronically Signed   By: Julian Hy M.D.   On: 06/05/2018 12:44   Dg Chest Port 1 View  Result Date: 06/09/2018 CLINICAL DATA:  Leukocytosis EXAM: PORTABLE CHEST 1 VIEW COMPARISON:  06/05/2018, 06/04/2018, 06/30/2017 FINDINGS: Low lung volumes. Slightly improved aeration of the bases with mild residual airspace disease. Stable cardiomegaly. Decreased central vascular congestion. No pneumothorax. IMPRESSION: 1. Cardiomegaly with improved vascular congestion 2. Improved aeration at the bilateral lung bases with minimal residual airspace disease, likely atelectasis. 3. No new focal pulmonary opacity. Electronically Signed   By: Donavan Foil M.D.   On: 06/09/2018 14:26   Dg Chest Port 1 View  Result Date: 06/05/2018 CLINICAL DATA:  Shortness of breath, generalized weakness EXAM: PORTABLE CHEST 1 VIEW COMPARISON:  06/04/2018 FINDINGS: Low lung  volumes. Cardiomegaly with pulmonary vascular congestion and possible mild perihilar edema. Mild patchy bilateral lower lobe opacities, likely atelectasis. No definite pleural effusions. No pneumothorax. IMPRESSION: Cardiomegaly with possible mild perihilar edema. Mild patchy bilateral lower lobe opacities, likely atelectasis. Electronically Signed   By: Julian Hy M.D.   On: 06/05/2018 12:43   Dg Chest Port 1 View  Result Date: 06/04/2018 CLINICAL DATA:  Tachycardia, upper abdomen pain and constipation for 5 days. EXAM: PORTABLE CHEST 1 VIEW COMPARISON:  June 30, 2017 FINDINGS: The mediastinal contour is normal. Heart size is enlarged. Consolidation of right lung base is identified. There is mild central pulmonary vascular congestion. There is elevation of the right hemidiaphragm unchanged. The visualized skeletal structures are stable. IMPRESSION: Right lung base pneumonia.  Central pulmonary vascular congestion. Electronically Signed   By: Abelardo Diesel M.D.   On: 06/04/2018 14:43    Time Spent in minutes  25  Melvin Whiteford M.D on 06/10/2018 at 10:47 AM  Between 7am to 7pm - Pager - [phone]  After 7pm go to [email or web address] - password [facility]  [facility] -  Office  [phone]

## 2018-06-10 NOTE — Progress Notes (Signed)
CT abdomen showed thickened urinary bladder suggestive of cystitis.  There was also diverticulum roof of the urinary bladder with 150 cc located fluid collection along the central mesentery and some located fluid in the right pelvic mesentery.  There is also a lot of third spacing fluid in the pleural space, ascites and mesenteric edema. Discussed with IR who recommended there is no window for access to collection.  Suggested they could have been a bladder leak. I spoke with urology on-call Dr. Tresa Moore who reviewed the images and suggested placing Foley catheter with plan on prolonged catheter use.  Recommended to call back if symptoms worsen in 24 hours. Ordered for Foley catheter placement and antibiotic advanced to empiric IV Zosyn.

## 2018-06-10 NOTE — Progress Notes (Signed)
Pharmacy Antibiotic Note  [name] is a 52 y.o. female admitted on 06/04/2018 with intra-abdominal infection.  Pharmacy has been consulted for Zosyn dosing.  Plan: Start Zosyn 3.375g IV q8h (4-hr infusion) Pharmacy will continue to monitor patient's clinical progress and renal function.   Height: 5\' 1"  (154.9 cm) Weight: 169 lb 5 oz (76.8 kg) IBW/kg (Calculated) : 47.8  Temp (24hrs), Avg:98.3 F (36.8 C), Min:97.5 F (36.4 C), Max:99.2 F (37.3 C)  Recent Labs  Lab 06/04/18 1450  06/04/18 1631 06/04/18 1827  06/06/18 0429 06/07/18 0348 06/08/18 0408 06/09/18 0634 06/10/18 0624  WBC  --   --   --   --    < > 10.8* 13.5* 15.2* 22.5* 23.6*  CREATININE  --    < >  --   --    < > 3.23* 2.47* 1.65* 1.43* 1.31*  LATICACIDVEN 2.44*  --  1.9 1.7  --   --   --   --   --   --    < > = values in this interval not displayed.    Estimated Creatinine Clearance: 46 mL/min (A) (by C-G formula based on SCr of 1.31 mg/dL (H)).    No Known Allergies  Antimicrobials this admission: 8/16 Zosyn >>   Microbiology results: 8/15 BCx2: NG x1 day 8/10 UCx:  E.  Coli (pan sensitive)  8/10 BC x2: NG x5 days 8/10 MRSA PCR: negative  Thank you for allowing pharmacy to be a part of this patient's care.  Despina Pole, Pharm. D. Clinical Pharmacist 06/10/2018 4:35 PM

## 2018-06-11 LAB — URINALYSIS, ROUTINE W REFLEX MICROSCOPIC
Bilirubin Urine: NEGATIVE
Glucose, UA: NEGATIVE mg/dL
Ketones, ur: NEGATIVE mg/dL
Nitrite: NEGATIVE
Protein, ur: 30 mg/dL — AB
RBC / HPF: >50 RBC/hpf — ABNORMAL HIGH (ref 0–5)
Specific Gravity, Urine: 1.013 (ref 1.005–1.030)
pH: 6 (ref 5.0–8.0)

## 2018-06-11 LAB — CBC
HCT: 27 % — ABNORMAL LOW (ref 36.0–46.0)
Hemoglobin: 8.2 g/dL — ABNORMAL LOW (ref 12.0–15.0)
MCH: 27.3 pg (ref 26.0–34.0)
MCHC: 30.4 g/dL (ref 30.0–36.0)
MCV: 90 fL (ref 78.0–100.0)
Platelets: 312 10*3/uL (ref 150–400)
RBC: 3 MIL/uL — ABNORMAL LOW (ref 3.87–5.11)
RDW: 16.1 % — ABNORMAL HIGH (ref 11.5–15.5)
WBC: 23 10*3/uL — ABNORMAL HIGH (ref 4.0–10.5)

## 2018-06-11 LAB — BASIC METABOLIC PANEL
Anion gap: 10 (ref 5–15)
BUN: 31 mg/dL — ABNORMAL HIGH (ref 6–20)
CO2: 23 mmol/L (ref 22–32)
Calcium: 8.7 mg/dL — ABNORMAL LOW (ref 8.9–10.3)
Chloride: 107 mmol/L (ref 98–111)
Creatinine, Ser: 1.23 mg/dL — ABNORMAL HIGH (ref 0.44–1.00)
GFR calc Af Amer: 57 mL/min — ABNORMAL LOW (ref >60)
GFR calc non Af Amer: 49 mL/min — ABNORMAL LOW (ref >60)
Glucose, Bld: 146 mg/dL — ABNORMAL HIGH (ref 70–99)
Potassium: 3.8 mmol/L (ref 3.5–5.1)
Sodium: 140 mmol/L (ref 135–145)

## 2018-06-11 LAB — GLUCOSE, CAPILLARY
Glucose-Capillary: 140 mg/dL — ABNORMAL HIGH (ref 70–99)
Glucose-Capillary: 119 mg/dL — ABNORMAL HIGH (ref 70–99)
Glucose-Capillary: 107 mg/dL — ABNORMAL HIGH (ref 70–99)
Glucose-Capillary: 137 mg/dL — ABNORMAL HIGH (ref 70–99)

## 2018-06-11 MED ORDER — BISACODYL 10 MG RE SUPP
10.0000 mg | Freq: Once | RECTAL | Status: AC
  Administered 2018-06-11: 10 mg via RECTAL
  Filled 2018-06-11: qty 1

## 2018-06-11 MED ORDER — BISACODYL 10 MG RE SUPP: 10.0000 mg | Freq: Every day | RECTAL | Status: DC | PRN
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
## 2018-06-12 LAB — BASIC METABOLIC PANEL
Anion gap: 8 (ref 5–15)
BUN: 25 mg/dL — ABNORMAL HIGH (ref 6–20)
CO2: 26 mmol/L (ref 22–32)
Calcium: 8.5 mg/dL — ABNORMAL LOW (ref 8.9–10.3)
Chloride: 107 mmol/L (ref 98–111)
Creatinine, Ser: 1.13 mg/dL — ABNORMAL HIGH (ref 0.44–1.00)
GFR calc Af Amer: >60 mL/min (ref >60)
GFR calc non Af Amer: 54 mL/min — ABNORMAL LOW (ref >60)
Glucose, Bld: 119 mg/dL — ABNORMAL HIGH (ref 70–99)
Potassium: 3.9 mmol/L (ref 3.5–5.1)
Sodium: 141 mmol/L (ref 135–145)

## 2018-06-12 LAB — GLUCOSE, CAPILLARY
Glucose-Capillary: 97 mg/dL (ref 70–99)
Glucose-Capillary: 92 mg/dL (ref 70–99)
Glucose-Capillary: 102 mg/dL — ABNORMAL HIGH (ref 70–99)
Glucose-Capillary: 97 mg/dL (ref 70–99)

## 2018-06-12 LAB — CBC
HCT: 26.1 % — ABNORMAL LOW (ref 36.0–46.0)
Hemoglobin: 7.8 g/dL — ABNORMAL LOW (ref 12.0–15.0)
MCH: 27.3 pg (ref 26.0–34.0)
MCHC: 29.9 g/dL — ABNORMAL LOW (ref 30.0–36.0)
MCV: 91.3 fL (ref 78.0–100.0)
Platelets: 337 10*3/uL (ref 150–400)
RBC: 2.86 MIL/uL — ABNORMAL LOW (ref 3.87–5.11)
RDW: 16 % — ABNORMAL HIGH (ref 11.5–15.5)
WBC: 22.1 10*3/uL — ABNORMAL HIGH (ref 4.0–10.5)

## 2018-06-12 MED ORDER — GLYCERIN LIQD
960.0000 mL | Freq: Once | Status: AC
Start: 2018-06-12 — End: 2018-06-12
  Administered 2018-06-12: 960 mL via RECTAL
  Filled 2018-06-12: qty 473

## 2018-06-12 NOTE — Plan of Care (Signed)
Administered sorbitol enema ordered, and patient had large bowel movement immediately after administration. Patient states she feels relief. Report given to Amy, RN and care resumed by Amy, RN.
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
## 2018-06-13 ENCOUNTER — Inpatient Hospital Stay (HOSPITAL_COMMUNITY): Payer: Medicare Other

## 2018-06-13 LAB — CBC WITH DIFFERENTIAL/PLATELET
Basophils Absolute: 0 10*3/uL (ref 0.0–0.1)
Basophils Relative: 0 %
Eosinophils Absolute: 0.2 10*3/uL (ref 0.0–0.7)
Eosinophils Relative: 1 %
HCT: 28 % — ABNORMAL LOW (ref 36.0–46.0)
Hemoglobin: 8.3 g/dL — ABNORMAL LOW (ref 12.0–15.0)
Lymphocytes Relative: 6 %
Lymphs Abs: 1.5 10*3/uL (ref 0.7–4.0)
MCH: 27.5 pg (ref 26.0–34.0)
MCHC: 29.6 g/dL — ABNORMAL LOW (ref 30.0–36.0)
MCV: 92.7 fL (ref 78.0–100.0)
Monocytes Absolute: 1.1 10*3/uL — ABNORMAL HIGH (ref 0.1–1.0)
Monocytes Relative: 5 %
Neutro Abs: 20.6 10*3/uL — ABNORMAL HIGH (ref 1.7–7.7)
Neutrophils Relative %: 88 %
Platelets: 370 10*3/uL (ref 150–400)
RBC: 3.02 MIL/uL — ABNORMAL LOW (ref 3.87–5.11)
RDW: 16.1 % — ABNORMAL HIGH (ref 11.5–15.5)
WBC: 23.3 10*3/uL — ABNORMAL HIGH (ref 4.0–10.5)

## 2018-06-13 LAB — BASIC METABOLIC PANEL
Anion gap: 13 (ref 5–15)
BUN: 18 mg/dL (ref 6–20)
CO2: 23 mmol/L (ref 22–32)
Calcium: 8.7 mg/dL — ABNORMAL LOW (ref 8.9–10.3)
Chloride: 107 mmol/L (ref 98–111)
Creatinine, Ser: 1.08 mg/dL — ABNORMAL HIGH (ref 0.44–1.00)
GFR calc Af Amer: >60 mL/min (ref >60)
GFR calc non Af Amer: 57 mL/min — ABNORMAL LOW (ref >60)
Glucose, Bld: 122 mg/dL — ABNORMAL HIGH (ref 70–99)
Potassium: 3.9 mmol/L (ref 3.5–5.1)
Sodium: 143 mmol/L (ref 135–145)

## 2018-06-13 LAB — GLUCOSE, CAPILLARY
Glucose-Capillary: 103 mg/dL — ABNORMAL HIGH (ref 70–99)
Glucose-Capillary: 110 mg/dL — ABNORMAL HIGH (ref 70–99)
Glucose-Capillary: 95 mg/dL (ref 70–99)
Glucose-Capillary: 79 mg/dL (ref 70–99)

## 2018-06-13 LAB — CBC
HCT: 28.3 % — ABNORMAL LOW (ref 36.0–46.0)
Hemoglobin: 8.3 g/dL — ABNORMAL LOW (ref 12.0–15.0)
MCH: 27.3 pg (ref 26.0–34.0)
MCHC: 29.3 g/dL — ABNORMAL LOW (ref 30.0–36.0)
MCV: 93.1 fL (ref 78.0–100.0)
Platelets: 390 10*3/uL (ref 150–400)
RBC: 3.04 MIL/uL — ABNORMAL LOW (ref 3.87–5.11)
RDW: 16.2 % — ABNORMAL HIGH (ref 11.5–15.5)
WBC: 24 10*3/uL — ABNORMAL HIGH (ref 4.0–10.5)

## 2018-06-13 MED ORDER — IOPAMIDOL (ISOVUE-300) INJECTION 61%
100.0000 mL | Freq: Once | INTRAVENOUS | Status: AC | PRN
  Administered 2018-06-13: 100 mL via INTRAVENOUS

## 2018-06-13 NOTE — Progress Notes (Signed)
Pharmacy Antibiotic Note  [name] is a 52 y.o. female admitted on 06/04/2018 with intra-abdominal infection.  Pharmacy has been consulted for Zosyn dosing.  Plan: Continue Zosyn 3.375g IV q8h (4-hr infusion) Pharmacy will continue to monitor patient's clinical progress and renal function.   Height: 5\' 1"  (154.9 cm) Weight: 169 lb 5 oz (76.8 kg) IBW/kg (Calculated) : 47.8  Temp (24hrs), Avg:98.5 F (36.9 C), Min:98.5 F (36.9 C), Max:98.5 F (36.9 C)  Recent Labs  Lab 06/09/18 0634 06/10/18 0624 06/11/18 0730 06/12/18 0514 06/13/18 0547 06/13/18 0734  WBC 22.5* 23.6* 23.0* 22.1* 24.0* 23.3*  CREATININE 1.43* 1.31* 1.23* 1.13* 1.08*  --     Estimated Creatinine Clearance: 55.8 mL/min (A) (by C-G formula based on SCr of 1.08 mg/dL (H)).    No Known Allergies  Antimicrobials this admission: Zosyn 8/16 >> Ceftriaxone 8/10 >> 8/13 Azithromycin 8/10 >> 8/13    Microbiology results: 8/15 BCx2: ngtd 8/10 UCx:  E.  Coli (pan sensitive)  8/10 BC x2: ng final 8/10 MRSA PCR: negative  Thank you for allowing pharmacy to be a part of this patient's care.   Margot Ables, PharmD Clinical Pharmacist 06/13/2018 1:24 PM

## 2018-06-13 NOTE — Progress Notes (Signed)
Patient called nurses station approximately 0430 requesting bed pan. When tech entered room, patient stated that she had called nurses station multiple times and was covered in stool. No nurse or tech reports answering call light for this patient. Patient had been asleep during hourly rounds all night. Patient did not have any stool on her, bed pad and sheets clean as well. Tech placed bedpan under patient with no results. Charge nurse informed of patient complaint and spoke with patient, educated on use of call light. Call light left in patient's reach, bed in lowest position with bed alarm on. Will continue to educate and monitor.

## 2018-06-13 NOTE — Care Management Important Message (Signed)
Important Message  Patient Details  Name: [name] MRN: [id number] Date of Birth: [birth year]   Medicare Important Message Given:  Yes    Shelda Altes 06/13/2018, 10:44 AM

## 2018-06-13 NOTE — Evaluation (Signed)
Physical Therapy Evaluation Patient Details Name: [name] MRN: [id number] DOB: May 04, 1966 Today's Date: 06/13/2018   History of Present Illness  [name] is a 52 y.o. female with medical history significant of polysubstance abuse has been sober for a year per her report, chronic systolic and diastolic congestive heart failure, chronic kidney disease with baseline creatinine less than 2, COPD, diabetes, hypertension, proximal atrial tachycardia comes in for several days of not feeling well and getting progressively weak.  Patient reports that for over 48 hours she has been having a lot of dysuria and since this time she has had markedly decreased urinary output.  She been having some lower abdominal cramps associated with this.  She is been very nauseous and very very weak.  She denies any fevers.  She denies any vomiting.  She denies any diarrhea.  She denies any cough.  She has been short of breath.  On arrival to the ED her heart rate was in the 818E and her systolic blood pressure in the 70s.  She is been given several liters of IV fluid and a Cardizem push in her heart rate and blood pressure have responded very well.  She is found to have a UTI with acute kidney injury with a creatinine of 7 and a potassium level 6.6.  She is also been found to have an infiltrate on chest x-ray.  Patient is being referred for admission for sepsis secondary to UTI pneumonia.  Patient reports she is feeling much better after the 2 L she is received in the ED but nowhere near normal.    Clinical Impression  Patient limited to a few side steps at bedside due to BLE weakness and poor standing balance, unsafe to attempt walking away from bedside.  Patient required assistance to move BLE during bed mobility and required Min/mod assist to reposition in bed.  Patient will benefit from continued physical therapy in hospital and recommended venue below to increase strength, balance, endurance for safe ADLs and  gait.    Follow Up Recommendations SNF;Supervision/Assistance - 24 hour    Equipment Recommendations  None recommended by PT    Recommendations for Other Services       Precautions / Restrictions Precautions Precautions: Fall Restrictions Weight Bearing Restrictions: No      Mobility  Bed Mobility Overal bed mobility: Needs Assistance Bed Mobility: Supine to Sit;Sit to Supine     Supine to sit: Mod assist Sit to supine: Mod assist   General bed mobility comments: slow labored movement  Transfers Overall transfer level: Needs assistance Equipment used: Rolling walker (2 wheeled) Transfers: Sit to/from Stand Sit to Stand: Mod assist         General transfer comment: BLE weakness  Ambulation/Gait Ambulation/Gait assistance: Max assist Gait Distance (Feet): 2 Feet Assistive device: Rolling walker (2 wheeled) Gait Pattern/deviations: Decreased step length - right;Decreased step length - left;Decreased stride length Gait velocity: slow   General Gait Details: limited to 4-5 unsteady labored slow side steps due to BLE weakness  Stairs            Wheelchair Mobility    Modified Rankin (Stroke Patients Only)       Balance Overall balance assessment: Needs assistance Sitting-balance support: Feet supported;No upper extremity supported Sitting balance-Leahy Scale: Good     Standing balance support: Bilateral upper extremity supported;During functional activity Standing balance-Leahy Scale: Poor Standing balance comment: fair/poor with RW  Pertinent Vitals/Pain Pain Assessment: 0-10 Pain Score: 8  Pain Location: low back Pain Descriptors / Indicators: Aching Pain Intervention(s): Limited activity within patient's tolerance;Monitored during session    [place] expects to be discharged to:: Private residence Living Arrangements: Alone Available Help at Discharge: Family;Available  PRN/intermittently Type of Home: House Home Access: Ramped entrance     Home Layout: One level Home Equipment: Bedside commode;Cane - single point;Walker - 2 wheels;Wheelchair - manual      Prior Function Level of Independence: Independent with assistive device(s)         Comments: household & short distanced community ambulator with RW or wheelchair     Hand Dominance   Dominant Hand: Right    Extremity/Trunk Assessment   Upper Extremity Assessment Upper Extremity Assessment: Generalized weakness    Lower Extremity Assessment Lower Extremity Assessment: Generalized weakness    Cervical / Trunk Assessment Cervical / Trunk Assessment: Normal  Communication   Communication: No difficulties  Cognition Arousal/Alertness: Awake/alert Behavior During Therapy: WFL for tasks assessed/performed Overall Cognitive Status: Within Functional Limits for tasks assessed                                        General Comments      Exercises     Assessment/Plan    PT Assessment Patient needs continued PT services  PT Problem List Decreased strength;Decreased activity tolerance;Decreased balance;Decreased mobility       PT Treatment Interventions Gait training;Functional mobility training;Therapeutic activities;Therapeutic exercise;Stair training;Wheelchair mobility training;Patient/family education    PT Goals (Current goals can be found in the Care Plan section)  Acute Rehab PT Goals Patient Stated Goal: return home with assistance PT Goal Formulation: With patient Time For Goal Achievement: 06/27/18 Potential to Achieve Goals: Good    Frequency Min 3X/week   Barriers to discharge        Co-evaluation               AM-PAC PT "6 Clicks" Daily Activity  Outcome Measure Difficulty turning over in bed (including adjusting bedclothes, sheets and blankets)?: A Little Difficulty moving from lying on back to sitting on the side of the bed? : A  Lot Difficulty sitting down on and standing up from a chair with arms (e.g., wheelchair, bedside commode, etc,.)?: A Lot Help needed moving to and from a bed to chair (including a wheelchair)?: A Lot Help needed walking in hospital room?: Total Help needed climbing 3-5 steps with a railing? : Total 6 Click Score: 11    End of Session   Activity Tolerance: Patient tolerated treatment well;Patient limited by fatigue Patient left: in bed;with call bell/phone within reach Nurse Communication: Mobility status PT Visit Diagnosis: Unsteadiness on feet (R26.81);Other abnormalities of gait and mobility (R26.89);Muscle weakness (generalized) (M62.81)    Time: 6295-2841 PT Time Calculation (min) (ACUTE ONLY): 24 min   Charges:   PT Evaluation $PT Eval Moderate Complexity: 1 Mod PT Treatments $Therapeutic Activity: 23-37 mins        4:19 PM, 06/13/18 Lonell Grandchild, MPT Physical Therapist with [facility] [phone] office [phone] mobile phone

## 2018-06-13 NOTE — Plan of Care (Signed)
  Problem: Acute Rehab PT Goals(only PT should resolve) Goal: Pt Will Go Supine/Side To Sit Outcome: Progressing Flowsheets (Taken 06/13/2018 1620) Pt will go Supine/Side to Sit: with minimal assist Goal: Pt Will Go Sit To Supine/Side Outcome: Progressing Flowsheets (Taken 06/13/2018 1620) Pt will go Sit to Supine/Side: with minimal assist Goal: Patient Will Transfer Sit To/From Stand Outcome: Progressing Flowsheets (Taken 06/13/2018 1620) Patient will transfer sit to/from stand: with minimal assist Goal: Pt Will Transfer Bed To Chair/Chair To Bed Outcome: Progressing Flowsheets (Taken 06/13/2018 1620) Pt will Transfer Bed to Chair/Chair to Bed: with min assist Goal: Pt Will Ambulate Outcome: Progressing Flowsheets (Taken 06/13/2018 1620) Pt will Ambulate: 25 feet; with moderate assist; with rolling walker   4:20 PM, 06/13/18 Lonell Grandchild, MPT Physical Therapist with [facility] [phone] office [phone] mobile phone
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
## 2018-06-14 DIAGNOSIS — K651 Peritoneal abscess: Secondary | ICD-10-CM

## 2018-06-14 LAB — PROTIME-INR
INR: 1.33
Prothrombin Time: 16.3 seconds — ABNORMAL HIGH (ref 11.4–15.2)

## 2018-06-14 LAB — OCCULT BLOOD X 1 CARD TO LAB, STOOL: Fecal Occult Bld: POSITIVE — AB

## 2018-06-14 LAB — CULTURE, BLOOD (ROUTINE X 2)
Culture: NO GROWTH
Culture: NO GROWTH
Special Requests: ADEQUATE
Special Requests: ADEQUATE

## 2018-06-14 LAB — CBC
HCT: 26.5 % — ABNORMAL LOW (ref 36.0–46.0)
Hemoglobin: 7.6 g/dL — ABNORMAL LOW (ref 12.0–15.0)
MCH: 27.2 pg (ref 26.0–34.0)
MCHC: 28.7 g/dL — ABNORMAL LOW (ref 30.0–36.0)
MCV: 95 fL (ref 78.0–100.0)
Platelets: 415 10*3/uL — ABNORMAL HIGH (ref 150–400)
RBC: 2.79 MIL/uL — ABNORMAL LOW (ref 3.87–5.11)
RDW: 15.9 % — ABNORMAL HIGH (ref 11.5–15.5)
WBC: 25.8 10*3/uL — ABNORMAL HIGH (ref 4.0–10.5)

## 2018-06-14 LAB — GLUCOSE, CAPILLARY
Glucose-Capillary: 96 mg/dL (ref 70–99)
Glucose-Capillary: 93 mg/dL (ref 70–99)
Glucose-Capillary: 145 mg/dL — ABNORMAL HIGH (ref 70–99)
Glucose-Capillary: 104 mg/dL — ABNORMAL HIGH (ref 70–99)

## 2018-06-14 NOTE — Progress Notes (Signed)
Patient's sister [name] visiting this evening. Requested update on plan of care. Patient stated her sister Junious Dresser could receive updates. Notified Dr. Clementeen Graham of request for update and he called to speak with her. Patient's sister [name] and daughter Percell Locus also called this afternoon to check on her. Patient stated both [name] and Percell Locus may receive updates as well. Donavan Foil, RN

## 2018-06-14 NOTE — Care Management Note (Signed)
Case Management Note  Patient Details  Name: [name] MRN: [id number] Date of Birth: 02-26-1966  If discussed at Long Length of Stay Meetings, dates discussed:  06/14/18  Additional Comments:  Sherald Barge, RN 06/14/2018, 1:04 PM

## 2018-06-14 NOTE — Consult Note (Signed)
Reason for Consult: Intra-abdominal abscesses Referring Physician: Dr. Raynald Kemp is an 52 y.o. female.  HPI: Patient is a 52 year old black female who was referred to my care by Dr. Cindie Laroche for evaluation of multiple fluid collections within the abdomen.  She has been in the hospital since 06/06/2018.  She recently developed worsening abdominal pain and the CT scan of the abdomen and pelvis was repeated and shows multiple large fluid collections within the abdominal cavity which are concerning for possible development of abscesses.  Initially it was thought that the patient had a bladder leak.  She states her abdominal pain is diffuse and sporadic in nature.  She denies any fevers.  She does have multiple medical problems including a 30% cardiac ejection fraction.  She is able to move around the bed without causing too much abdominal pain.  She denies any diarrhea or constipation.  Past Medical History:  Diagnosis Date  . Alcohol use   . Ankle fracture, lateral malleolus, closed 12/30/2011  . Anxiety   . Breast mass, left 12/15/2011  . Chronic anemia   . Chronic combined systolic and diastolic CHF (congestive heart failure) ([place])   . CKD (chronic kidney disease), stage III ([place])    pt denies  . Cocaine abuse ([place])   . COPD (chronic obstructive pulmonary disease) ([place])   . Diabetes mellitus, type 2 ([place])   . Diabetic Charcot foot ([place]) 12/30/2011  . Essential hypertension   . History of GI bleed 12/05/2009   Qualifier: Diagnosis of  By: Zeb Comfort    . Hyperlipidemia   . Hypertensive cardiomyopathy ([place]) 11/08/2012   a. 05/2016: echo showing EF of 20-25% with normal cors by cath in 07/2016.  Marland Kitchen Noncompliance with medication regimen   . Obesity   . Panic attacks   . PAT (paroxysmal atrial tachycardia) ([place])    a. started on amiodarone 07/2017 for this.  . Sleep apnea    does not use cpap  . Stroke ([place])    06/24/17  . Tobacco abuse   . Urinary incontinence     Past  Surgical History:  Procedure Laterality Date  . BREAST BIOPSY    . CARDIAC CATHETERIZATION N/A 07/28/2016   Procedure: Left Heart Cath and Coronary Angiography;  Surgeon: Jettie Booze, MD;  Location: [facility];  Service: Cardiovascular;  Laterality: N/A;  . DILATION AND CURETTAGE OF UTERUS    . I&D EXTREMITY Bilateral 09/22/2017   Procedure: BILATERAL DEBRIDEMENT LEG/FOOT ULCERS, APPLY VERAFLO WOUND VAC;  Surgeon: Newt Minion, MD;  Location: [facility];  Service: Orthopedics;  Laterality: Bilateral;  . SKIN SPLIT GRAFT Bilateral 09/28/2017   Procedure: BILATERAL SPLIT THICKNESS SKIN GRAFT LEGS/FEET AND APPLY VAC;  Surgeon: Newt Minion, MD;  Location: [facility];  Service: Orthopedics;  Laterality: Bilateral;    Family History  Problem Relation Age of Onset  . Hypertension Mother   . Heart attack Mother   . Hypertension Father        CABG   . Hypertension Sister   . Hypertension Brother   . Hypertension Sister   . Cancer Sister        breast   . Arthritis Unknown   . Cancer Unknown   . Diabetes Unknown   . Asthma Unknown   . Hypertension Daughter   . Hypertension Son     Social History:  reports that she quit smoking about a year ago. Her smoking use included cigarettes. She has a 20.00  pack-year smoking history. She has never used smokeless tobacco. She reports that she has current or past drug history. Drugs: Marijuana and Cocaine. Frequency: 3.00 times per week. She reports that she does not drink alcohol.  Allergies: No Known Allergies  Medications: I have reviewed the patient's current medications.  Results for orders placed or performed during the hospital encounter of 06/04/18 (from the past 48 hour(s))  Glucose, capillary     Status: Abnormal   Collection Time: 06/12/18  4:26 PM  Result Value Ref Range   Glucose-Capillary 102 (H) 70 - 99 mg/dL  Glucose, capillary     Status: None   Collection Time: 06/12/18  9:40 PM  Result Value Ref Range    Glucose-Capillary 97 70 - 99 mg/dL   Comment 1 Notify RN    Comment 2 Document in Chart   CBC     Status: Abnormal   Collection Time: 06/13/18  5:47 AM  Result Value Ref Range   WBC 24.0 (H) 4.0 - 10.5 K/uL   RBC 3.04 (L) 3.87 - 5.11 MIL/uL   Hemoglobin 8.3 (L) 12.0 - 15.0 g/dL   HCT 28.3 (L) 36.0 - 46.0 %   MCV 93.1 78.0 - 100.0 fL   MCH 27.3 26.0 - 34.0 pg   MCHC 29.3 (L) 30.0 - 36.0 g/dL   RDW 16.2 (H) 11.5 - 15.5 %   Platelets 390 150 - 400 K/uL    Comment: Performed at [facility], [address], [place], [place] [address]  Basic metabolic panel     Status: Abnormal   Collection Time: 06/13/18  5:47 AM  Result Value Ref Range   Sodium 143 135 - 145 mmol/L   Potassium 3.9 3.5 - 5.1 mmol/L   Chloride 107 98 - 111 mmol/L   CO2 23 22 - 32 mmol/L   Glucose, Bld 122 (H) 70 - 99 mg/dL   BUN 18 6 - 20 mg/dL   Creatinine, Ser 1.08 (H) 0.44 - 1.00 mg/dL   Calcium 8.7 (L) 8.9 - 10.3 mg/dL   GFR calc non Af Amer 57 (L) >60 mL/min   GFR calc Af Amer >60 >60 mL/min    Comment: (NOTE) The eGFR has been calculated using the CKD EPI equation. This calculation has not been validated in all clinical situations. eGFR's persistently <60 mL/min signify possible Chronic Kidney Disease.    Anion gap 13 5 - 15    Comment: Performed at [facility], [address], [place], [place] [address]  CBC with Differential/Platelet     Status: Abnormal   Collection Time: 06/13/18  7:34 AM  Result Value Ref Range   WBC 23.3 (H) 4.0 - 10.5 K/uL   RBC 3.02 (L) 3.87 - 5.11 MIL/uL   Hemoglobin 8.3 (L) 12.0 - 15.0 g/dL   HCT 28.0 (L) 36.0 - 46.0 %   MCV 92.7 78.0 - 100.0 fL   MCH 27.5 26.0 - 34.0 pg   MCHC 29.6 (L) 30.0 - 36.0 g/dL   RDW 16.1 (H) 11.5 - 15.5 %   Platelets 370 150 - 400 K/uL   Neutrophils Relative % 88 %   Neutro Abs 20.6 (H) 1.7 - 7.7 K/uL   Lymphocytes Relative 6 %   Lymphs Abs 1.5 0.7 - 4.0 K/uL   Monocytes Relative 5 %   Monocytes Absolute 1.1 (H) 0.1 - 1.0 K/uL   Eosinophils  Relative 1 %   Eosinophils Absolute 0.2 0.0 - 0.7 K/uL   Basophils Relative 0 %  Basophils Absolute 0.0 0.0 - 0.1 K/uL    Comment: Performed at [facility], [address] [place], [place] [address]  Glucose, capillary     Status: Abnormal   Collection Time: 06/13/18  7:37 AM  Result Value Ref Range   Glucose-Capillary 103 (H) 70 - 99 mg/dL  Glucose, capillary     Status: Abnormal   Collection Time: 06/13/18 11:53 AM  Result Value Ref Range   Glucose-Capillary 110 (H) 70 - 99 mg/dL  Glucose, capillary     Status: None   Collection Time: 06/13/18  4:39 PM  Result Value Ref Range   Glucose-Capillary 95 70 - 99 mg/dL  Glucose, capillary     Status: None   Collection Time: 06/13/18  8:59 PM  Result Value Ref Range   Glucose-Capillary 79 70 - 99 mg/dL  CBC     Status: Abnormal   Collection Time: 06/14/18  5:50 AM  Result Value Ref Range   WBC 25.8 (H) 4.0 - 10.5 K/uL   RBC 2.79 (L) 3.87 - 5.11 MIL/uL   Hemoglobin 7.6 (L) 12.0 - 15.0 g/dL   HCT 26.5 (L) 36.0 - 46.0 %   MCV 95.0 78.0 - 100.0 fL   MCH 27.2 26.0 - 34.0 pg   MCHC 28.7 (L) 30.0 - 36.0 g/dL   RDW 15.9 (H) 11.5 - 15.5 %   Platelets 415 (H) 150 - 400 K/uL    Comment: Performed at [facility], [address], [place], [place] [address]  Glucose, capillary     Status: None   Collection Time: 06/14/18  7:43 AM  Result Value Ref Range   Glucose-Capillary 96 70 - 99 mg/dL  Glucose, capillary     Status: None   Collection Time: 06/14/18 11:27 AM  Result Value Ref Range   Glucose-Capillary 93 70 - 99 mg/dL   Comment 1 Notify RN    Comment 2 Document in Chart     Ct Abdomen Pelvis W Contrast  Result Date: 06/13/2018 CLINICAL DATA:  Abdominal pain, weakness, leukocytosis, UTI EXAM: CT ABDOMEN AND PELVIS WITH CONTRAST TECHNIQUE: Multidetector CT imaging of the abdomen and pelvis was performed using the standard protocol following bolus administration of intravenous contrast. CONTRAST:  130m ISOVUE-300 IOPAMIDOL  (ISOVUE-300) INJECTION 61% COMPARISON:  06/10/2018 FINDINGS: Motion degraded images. Lower chest: Moderate right pleural effusion with associated right lower lobe atelectasis. Cardiomegaly. Hepatobiliary: Liver is within normal limits. Gallbladder is unremarkable. No intrahepatic or extrahepatic ductal dilatation. Pancreas: Within normal limits. Spleen: Within normal limits. Adrenals/Urinary Tract: Mild thickening of the bilateral renal gland without discrete mass. Kidneys are within normal limits.  No hydronephrosis. Bladder is thick-walled although decompressed by indwelling Foley catheter. Stomach/Bowel: Stomach is within normal limits. No evidence of bowel obstruction. Mildly prominent loops small bowel in the left mid abdomen with thickened fold, suggesting reactive changes versus small bowel enteritis. Normal appendix (series 2/image 63). Mild rectal wall thickening, (series 2/image 67), new, favored to be reactive. No pneumatosis. Vascular/Lymphatic: No evidence of abdominal aortic aneurysm. Atherosclerotic calcifications of the abdominal aorta and branch vessels. No suspicious abdominopelvic lymphadenopathy. Reproductive: Heterogeneous uterus, displaced to the left. Left ovary is notable for mild adnexal fluid. Right ovary is grossly unremarkable. Other: 5.7 x 7.2 cm mesenteric fluid collection in the anterior lower abdomen (series 2/image 63), with rim enhancement, worrisome for developing abscess. Direct communication with small bowel is not apparent. This collection abuts the bladder dome (sagittal image 80). Additional 4.1 x 7.4 cm fluid collection in the  pelvic cul-de-sac (series 2/image 69), with rim enhancement, worrisome for developing abscess. Additional mild perihepatic ascites (series 2/image 14). Additional mesenteric/abdominal fluid collections (series 2/images 61 and 71) and left adnexal fluid collections (series 2/images 67 and 68). No free air. Musculoskeletal: Degenerative changes of the  visualized thoracolumbar spine. IMPRESSION: Multiple abdominopelvic fluid collections, including a dominant 5.7 x 7.2 cm mesenteric fluid collection in the anterior lower abdomen and a dominant 4.1 x 7.4 cm fluid collection in the pelvic cul-de-sac, worrisome for developing abscesses. Reactive inflammatory changes involving small bowel and rectum, as described above. No pneumatosis or free air. Bladder is thick-walled although decompressed by indwelling Foley catheter. Bladder dome abuts the dominant lower anterior abdominal fluid collection noted above. Moderate right pleural effusion. No evidence of bowel obstruction.  Normal appendix. Electronically Signed   By: Julian Hy M.D.   On: 06/13/2018 19:18    ROS:  Pertinent items are noted in HPI.  Blood pressure 133/80, pulse 78, temperature 98.6 F (37 C), temperature source Oral, resp. rate 18, height '5\' 1"'  (1.549 m), weight 75.2 kg, last menstrual period 05/19/2016, SpO2 96 %. Physical Exam: Pleasant black female in no acute distress Head is normocephalic, atraumatic Lungs are clear to auscultation with equal breath sounds bilaterally Heart examination reveals a regular rate and rhythm without S3, S4, murmurs Abdomen is soft and not particularly distended.  No point tenderness is noted.  No rigidity is noted.  She does have some discomfort to deep palpation in various areas of the abdomen which are migratory in nature. CT scan images personally reviewed  Assessment/Plan: Impression: Intra-abdominal fluid collections with leukocytosis, probable developing abscesses.  Patient is a poor surgical candidate for any intervention. Plan: Agree with getting interventional radiology to drain the fluid collections.  This may help delineate the etiology.  Further management is pending those results.  This was discussed with Dr. Clementeen Graham.  Aviva Signs 06/14/2018, 12:09 PM

## 2018-06-14 NOTE — Progress Notes (Signed)
Patient to go to [facility] IR tomorrow for drain placement and remain NPO after midnight per Jannifer Franklin, PA. She states patient may resume diet for today if okay with Dr. Clementeen Graham. Text paged MD to notify. Donavan Foil, RN
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
## 2018-06-14 NOTE — Progress Notes (Addendum)
Patient ID: [name], female   DOB: [birth year], 52 y.o.   MRN: [id number]   Pelvic abscess drain placement and possible cul de sac collection aspiration Scheduled at [facility] tomorrow  Pt to be at [place] am via ambulance 8/21 RN aware  Pt consents self

## 2018-06-14 NOTE — Progress Notes (Signed)
PT Cancellation Note  Patient Details Name: [name] MRN: [id number] DOB: 02-22-1966   Cancelled Treatment:    Reason Eval/Treat Not Completed: Pain limiting ability to participate.  Pt refused over abd pain including just bed exercises over her pain issues.  Will try again after her procedure tomorrow   Ramond Dial 06/14/2018, 2:41 PM   Mee Hives, PT MS Acute Rehab Dept. Number: [place] and [place]

## 2018-06-14 NOTE — Progress Notes (Signed)
Rounding completed. Patient sleeping in supine position. No distress noted. Call bell within reach, bed in low position. Will continue to monitor.

## 2018-06-14 NOTE — Clinical Social Work Note (Signed)
Patient states that she now has her "own place" and she would rather have HHPT services than to go to a SNF.  LCSW will follow up with patient again to identify if she had reconsidered.   Yousef Huge, Clydene Pugh, LCSW

## 2018-06-15 ENCOUNTER — Encounter (HOSPITAL_COMMUNITY): Payer: Self-pay | Admitting: Radiology

## 2018-06-15 ENCOUNTER — Ambulatory Visit (HOSPITAL_COMMUNITY)
Admit: 2018-06-15 | Discharge: 2018-06-15 | Disposition: A | Discharge status: Home / Self Care | Payer: Medicare Other | Attending: Internal Medicine | Admitting: Internal Medicine

## 2018-06-15 DIAGNOSIS — E875 Hyperkalemia: Secondary | ICD-10-CM

## 2018-06-15 DIAGNOSIS — N739 Female pelvic inflammatory disease, unspecified: Secondary | ICD-10-CM

## 2018-06-15 DIAGNOSIS — E1165 Type 2 diabetes mellitus with hyperglycemia: Secondary | ICD-10-CM

## 2018-06-15 DIAGNOSIS — I480 Paroxysmal atrial fibrillation: Secondary | ICD-10-CM

## 2018-06-15 DIAGNOSIS — G894 Chronic pain syndrome: Secondary | ICD-10-CM

## 2018-06-15 DIAGNOSIS — E1129 Type 2 diabetes mellitus with other diabetic kidney complication: Secondary | ICD-10-CM

## 2018-06-15 DIAGNOSIS — R188 Other ascites: Secondary | ICD-10-CM | POA: Diagnosis not present

## 2018-06-15 LAB — GRAM STAIN

## 2018-06-15 LAB — GLUCOSE, CAPILLARY
Glucose-Capillary: 83 mg/dL (ref 70–99)
Glucose-Capillary: 55 mg/dL — ABNORMAL LOW (ref 70–99)
Glucose-Capillary: 111 mg/dL — ABNORMAL HIGH (ref 70–99)
Glucose-Capillary: 139 mg/dL — ABNORMAL HIGH (ref 70–99)

## 2018-06-15 LAB — BASIC METABOLIC PANEL
Anion gap: 11 (ref 5–15)
BUN: 10 mg/dL (ref 6–20)
CO2: 27 mmol/L (ref 22–32)
Calcium: 8.7 mg/dL — ABNORMAL LOW (ref 8.9–10.3)
Chloride: 105 mmol/L (ref 98–111)
Creatinine, Ser: 1.01 mg/dL — ABNORMAL HIGH (ref 0.44–1.00)
GFR calc Af Amer: >60 mL/min (ref >60)
GFR calc non Af Amer: >60 mL/min (ref >60)
Glucose, Bld: 104 mg/dL — ABNORMAL HIGH (ref 70–99)
Potassium: 3.5 mmol/L (ref 3.5–5.1)
Sodium: 143 mmol/L (ref 135–145)

## 2018-06-15 LAB — CBC
HCT: 27.2 % — ABNORMAL LOW (ref 36.0–46.0)
Hemoglobin: 8.1 g/dL — ABNORMAL LOW (ref 12.0–15.0)
MCH: 27.8 pg (ref 26.0–34.0)
MCHC: 29.8 g/dL — ABNORMAL LOW (ref 30.0–36.0)
MCV: 93.5 fL (ref 78.0–100.0)
Platelets: 446 10*3/uL — ABNORMAL HIGH (ref 150–400)
RBC: 2.91 MIL/uL — ABNORMAL LOW (ref 3.87–5.11)
RDW: 16.2 % — ABNORMAL HIGH (ref 11.5–15.5)
WBC: 22.1 10*3/uL — ABNORMAL HIGH (ref 4.0–10.5)

## 2018-06-15 MED ORDER — SODIUM CHLORIDE 0.9% FLUSH
5.0000 mL | Freq: Three times a day (TID) | INTRAVENOUS | Status: DC
  Administered 2018-06-15 – 2018-06-20 (×12): 5 mL

## 2018-06-15 MED ORDER — FENTANYL CITRATE (PF) 100 MCG/2ML IJ SOLN
INTRAMUSCULAR | Status: AC
  Filled 2018-06-15: qty 4

## 2018-06-15 MED ORDER — MIDAZOLAM HCL 2 MG/2ML IJ SOLN
INTRAMUSCULAR | Status: AC | PRN
  Administered 2018-06-15 (×3): 1 mg via INTRAVENOUS

## 2018-06-15 MED ORDER — LIDOCAINE HCL 1 % IJ SOLN
INTRAMUSCULAR | Status: AC
  Filled 2018-06-15: qty 20

## 2018-06-15 MED ORDER — FENTANYL CITRATE (PF) 100 MCG/2ML IJ SOLN
INTRAMUSCULAR | Status: AC | PRN
  Administered 2018-06-15 (×3): 50 ug via INTRAVENOUS

## 2018-06-15 MED ORDER — MIDAZOLAM HCL 2 MG/2ML IJ SOLN
INTRAMUSCULAR | Status: AC
  Filled 2018-06-15: qty 6

## 2018-06-15 MED ORDER — DEXTROSE 50 % IV SOLN
INTRAVENOUS | Status: AC
  Administered 2018-06-15: 50 mL
  Filled 2018-06-15: qty 50

## 2018-06-15 NOTE — Sedation Documentation (Signed)
Patient is resting comfortably. 
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
## 2018-06-15 NOTE — Sedation Documentation (Signed)
o2 2L applied for sats 86% R AIR

## 2018-06-15 NOTE — Progress Notes (Signed)
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 device. FINDINGS: Approximately 25 mL turbid ascitic fluid aspirated from the right perihepatic fluid collection. The intra mesenteric fluid collection is not amenable to percutaneous aspiration or drainage at this time. IMPRESSION: 1. Successful placement of a 12 French drainage catheter in the right upper quadrant perihepatic fluid collection with aspiration of turbid ascitic fluid. A sample was sent for Gram stain and culture. 2. Unsuccessful attempted aspiration/drain placement of the mesenteric fluid collection. There  is no safe window to proceed. Signed, Criselda Peaches, MD [facility] Radiology Electronically Signed   By: Jacqulynn Cadet M.D.   On: 06/15/2018 17:22    Time Spent: 30 minutes.  Barton Dubois M.D on 06/15/2018 at 9:35 PM  Between 7am to 7pm - Pager - [phone]  After 7pm go to [email or web address] - password [facility]  [facility] -  Office  [phone]

## 2018-06-15 NOTE — Procedures (Signed)
Interventional Radiology Procedure Note  Procedure:  1.) Placement of a 71F drain into the perihepatic fluid collection via RUQ.  Aspirated 25 mL turbid ascities.  2.) Attempted aspiration of mesenteric fluid collection aborted due to lack of safe window.  Complications: None  Estimated Blood Loss: None  Recommendations: - Drain to JP bulb - Follow out-put - Cultures pending   Signed,  Criselda Peaches, MD

## 2018-06-16 DIAGNOSIS — L8990 Pressure ulcer of unspecified site, unspecified stage: Secondary | ICD-10-CM

## 2018-06-16 LAB — GLUCOSE, CAPILLARY
Glucose-Capillary: 118 mg/dL — ABNORMAL HIGH (ref 70–99)
Glucose-Capillary: 110 mg/dL — ABNORMAL HIGH (ref 70–99)
Glucose-Capillary: 118 mg/dL — ABNORMAL HIGH (ref 70–99)
Glucose-Capillary: 95 mg/dL (ref 70–99)

## 2018-06-16 MED ORDER — AMOXICILLIN-POT CLAVULANATE 875-125 MG PO TABS
1.0000 | ORAL_TABLET | Freq: Two times a day (BID) | ORAL | Status: DC
  Administered 2018-06-16 – 2018-06-20 (×9): 1 via ORAL
  Filled 2018-06-16 (×9): qty 1

## 2018-06-16 NOTE — Progress Notes (Signed)
Patient called nurse and charge to room stating "I feel like something isn't on me right." She could not describe how or where she felt that the problem was. She finally stated that her pain was a 10 and it was "all over." We checked her foley, her JP drain, slid her up in bed, repositioned and gave her pain meds. No issues were noted with equipment. Patient stated that she felt better after repositioning her. Will continue to monitor.

## 2018-06-16 NOTE — Progress Notes (Signed)
Patient ID: [name], female   DOB: 26-Jul-1966, 52 y.o.   MRN: [id number]   Perihepatic abscess drain placed in IR 8/21  Doing well per RN Draining well Flushes easily OP 70 cc yesterday OP purulent NGTD  Minimal soreness at site No bleeding Skin is clean and dry  Will follow by phone  Follow notes in chart  Will need to flush daily at home 5-10 cc sterile saline Record output Will see IR OP Clinic 7-10 days  Will hear from scheduler for time and date

## 2018-06-16 NOTE — Progress Notes (Addendum)
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 Cook 12 [place] all-purpose drainage catheter was then modified with additional sideholes, advanced over the wire and formed. Aspiration yields approximately 25 mL  of turbid serosanguineous ascitic fluid. Post drain placement CT imaging demonstrates a well-positioned drainage catheter lateral and posterior to the right hepatic lobe within the fluid collection. The drain was connected to JP bulb suction and secured to the skin with 0 Prolene suture and an adhesive fixation device. FINDINGS: Approximately 25 mL turbid ascitic fluid aspirated from the right perihepatic fluid collection. The intra mesenteric fluid collection is not amenable to percutaneous aspiration or drainage at this time. IMPRESSION: 1. Successful placement of a 12 French drainage catheter in the right upper quadrant perihepatic fluid collection with aspiration of turbid ascitic fluid. A sample was  sent for Gram stain and culture. 2. Unsuccessful attempted aspiration/drain placement of the mesenteric fluid collection. There is no safe window to proceed. Signed, Criselda Peaches, MD [facility] Electronically Signed   By: Jacqulynn Cadet M.D.   On: 06/15/2018 17:22    Time Spent: 30 minutes.  Barton Dubois M.D on 06/16/2018 at 5:32 PM  Between 7am to 7pm - Pager - [phone]  After 7pm go to [email or web address] - password [facility]  [facility] -  Office  [phone]

## 2018-06-16 NOTE — Care Management Note (Signed)
Case Management Note  Patient Details  Name: [name] MRN: [id number] Date of Birth: Mar 24, 1966  Subjective/Objective:       From home with abdominal abscess. Pt has drain in place. Pt lives alone, ind with ADL's. Pt says she will have no support after DC. Refusing SNF but agreeable to [facility].               Action/Plan: DC home tomorrow with [facility] services. Pt has used [facility] in the past. Pt aware HH will have 48 hrs to make first visit. Tim, [facility] rep, given referral.   Expected Discharge Date:        06/17/2018          Expected Discharge Plan:  Home with [facility]  In-House Referral:  NA  Discharge planning Services  CM Consult  Post Acute Care Choice:  Home Health Choice offered to:  Patient  DME Arranged:    DME Agency:     HH Arranged:  RN, PT, Nurse's Aide [place] Agency:  [facility] ([facility]  Status of Service:  Completed, signed off  If discussed at [employer] of Stay Meetings, dates discussed: 06/16/18   Additional Comments:  Sherald Barge, RN 06/16/2018, 1:13 PM

## 2018-06-16 NOTE — Progress Notes (Signed)
Subjective: Patient states she has abdominal pain at her catheter drainage site.  Otherwise, she feels better and is asking when she will be discharged.  Objective: Vital signs in last 24 hours: Temp:  [98.4 F (36.9 C)-99.6 F (37.6 C)] 98.5 F (36.9 C) (08/22 0605) Pulse Rate:  [68-79] 75 (08/22 0605) Resp:  [13-26] 20 (08/22 0605) BP: (134-170)/(78-97) 147/97 (08/22 0605) SpO2:  [86 %-100 %] 94 % (08/22 0605) Last BM Date: 06/15/18  Intake/Output from previous day: 08/21 0701 - 08/22 0700 In: 135 [P.O.:120; I.V.:10] Out: 570 [Urine:500; Drains:70] Intake/Output this shift: No intake/output data recorded.  General appearance: alert, cooperative and no distress GI: Soft with some tenderness noted around the catheter exit site along the right side of the abdomen.  No rigidity is noted.  Bowel sounds are present.  Lab Results:  Recent Labs    06/14/18 0550 06/15/18 0551  WBC 25.8* 22.1*  HGB 7.6* 8.1*  HCT 26.5* 27.2*  PLT 415* 446*   BMET Recent Labs    06/15/18 0551  NA 143  K 3.5  CL 105  CO2 27  GLUCOSE 104*  BUN 10  CREATININE 1.01*  CALCIUM 8.7*   PT/INR Recent Labs    06/14/18 1733  LABPROT 16.3*  INR 1.33    Studies/Results: Ct Image Guided Drainage By Percutaneous Catheter  Result Date: 06/15/2018 INDICATION: 52 year old female with multiple intra-abdominal fluid collections from a presumed gastrointestinal source. She presents for attempted CT-guided drain placement versus aspiration. EXAM: CT GUIDED DRAINAGE OF perihepatic ABSCESS MEDICATIONS: The patient is currently admitted to the hospital and receiving intravenous antibiotics. The antibiotics were administered within an appropriate time frame prior to the initiation of the procedure. ANESTHESIA/SEDATION: 3 mg IV Versed 150 mcg IV Fentanyl Moderate Sedation Time:  30 minutes The patient was continuously monitored during the procedure by the interventional radiology nurse under my direct  supervision. COMPLICATIONS: None immediate. TECHNIQUE: Informed written consent was obtained from the patient after a thorough discussion of the procedural risks, benefits and alternatives. All questions were addressed. Maximal Sterile Barrier Technique was utilized including caps, mask, sterile gowns, sterile gloves, sterile drape, hand hygiene and skin antiseptic. A timeout was performed prior to the initiation of the procedure. PROCEDURE: The operative field was prepped with chlorhexidine in a sterile fashion, and a sterile drape was applied covering the operative field. A sterile gown and sterile gloves were used for the procedure. Local anesthesia was provided with 1% Lidocaine. During the procedure it was noted that the patient uses accessory abdominal muscles during breathing. Her stomach heaves with every breath. This makes localization of her intra-abdominal fluid collection somewhat challenging. On the initial imaging, there was a questionable very small window to the mesenteric fluid collection. Local anesthesia was attained by infiltration with 1% lidocaine a small dermatotomy was made. An attempt was made to guide an 18 gauge trocar needle from the skin surface into this fluid collection, however during intermittent CT imaging, it was evident that there was significant motion of the overlying small bowel resulting in only in intermittent window. That combined with the significant heaving of the abdominal wall resulted in a combination of factors that was too dangerous to proceed. This portion of the procedure was aborted. Attention was turned to the right upper quadrant perihepatic fluid collection. Repeat CT imaging was performed. A suitable skin entry site was identified and marked. The skin was again prepped and draped in standard fashion using chlorhexidine skin prep. Local anesthesia was attained by  infiltration with 1% lidocaine. A small dermatotomy was made. An 18 gauge trocar needle was advanced  into the fluid collection under intermittent CT guidance. Once within the fluid collection, a 0.035 wire was advanced in the fluid collection, the needle was removed and the skin tract was dilated to 12 [place]. A Cook 12 [place] all-purpose drainage catheter was then modified with additional sideholes, advanced over the wire and formed. Aspiration yields approximately 25 mL of turbid serosanguineous ascitic fluid. Post drain placement CT imaging demonstrates a well-positioned drainage catheter lateral and posterior to the right hepatic lobe within the fluid collection. The drain was connected to JP bulb suction and secured to the skin with 0 Prolene suture and an adhesive fixation device. FINDINGS: Approximately 25 mL turbid ascitic fluid aspirated from the right perihepatic fluid collection. The intra mesenteric fluid collection is not amenable to percutaneous aspiration or drainage at this time. IMPRESSION: 1. Successful placement of a 12 French drainage catheter in the right upper quadrant perihepatic fluid collection with aspiration of turbid ascitic fluid. A sample was sent for Gram stain and culture. 2. Unsuccessful attempted aspiration/drain placement of the mesenteric fluid collection. There is no safe window to proceed. Signed, Criselda Peaches, MD [facility] Electronically Signed   By: Jacqulynn Cadet M.D.   On: 06/15/2018 17:22    Anti-infectives: Anti-infectives (From admission, onward)   Start     Dose/Rate Route Frequency Ordered Stop   06/10/18 1700  piperacillin-tazobactam (ZOSYN) IVPB 3.375 g     3.375 g 12.5 mL/hr over 240 Minutes Intravenous Every 8 hours 06/10/18 1629     06/07/18 1500  cefTRIAXone (ROCEPHIN) 1 g in sodium chloride 0.9 % 100 mL IVPB  Status:  Discontinued     1 g 200 mL/hr over 30 Minutes Intravenous Every 24 hours 06/07/18 1041 06/10/18 1625   06/04/18 1930  azithromycin (ZITHROMAX) 500 mg in sodium  chloride 0.9 % 250 mL IVPB  Status:  Discontinued     500 mg 250 mL/hr over 60 Minutes Intravenous Every 24 hours 06/04/18 1622 06/07/18 1027   06/04/18 1630  cefTRIAXone (ROCEPHIN) 1 g in sodium chloride 0.9 % 100 mL IVPB  Status:  Discontinued     1 g 200 mL/hr over 30 Minutes Intravenous Every 24 hours 06/04/18 1622 06/05/18 0844   06/04/18 1530  azithromycin (ZITHROMAX) 500 mg in sodium chloride 0.9 % 250 mL IVPB     500 mg 250 mL/hr over 60 Minutes Intravenous  Once 06/04/18 1523 06/04/18 1759   06/04/18 1500  cefTRIAXone (ROCEPHIN) 2 g in sodium chloride 0.9 % 100 mL IVPB  Status:  Discontinued     2 g 200 mL/hr over 30 Minutes Intravenous Every 24 hours 06/04/18 1456 06/07/18 1041      Assessment/Plan: Impression: Intra-abdominal fluid collections.  Initial Gram stain negative for bacteria.  Final cultures pending.  Repeat white blood cell count pending.  No need for acute surgical intervention at this time.    LOS: 12 days    Aviva Signs 06/16/2018

## 2018-06-17 DIAGNOSIS — L89152 Pressure ulcer of sacral region, stage 2: Secondary | ICD-10-CM

## 2018-06-17 LAB — GLUCOSE, CAPILLARY
Glucose-Capillary: 50 mg/dL — ABNORMAL LOW (ref 70–99)
Glucose-Capillary: 98 mg/dL (ref 70–99)
Glucose-Capillary: 69 mg/dL — ABNORMAL LOW (ref 70–99)
Glucose-Capillary: 76 mg/dL (ref 70–99)
Glucose-Capillary: 103 mg/dL — ABNORMAL HIGH (ref 70–99)
Glucose-Capillary: 118 mg/dL — ABNORMAL HIGH (ref 70–99)
Glucose-Capillary: 130 mg/dL — ABNORMAL HIGH (ref 70–99)

## 2018-06-17 LAB — CBC
HCT: 27.1 % — ABNORMAL LOW (ref 36.0–46.0)
Hemoglobin: 8 g/dL — ABNORMAL LOW (ref 12.0–15.0)
MCH: 27.9 pg (ref 26.0–34.0)
MCHC: 29.5 g/dL — ABNORMAL LOW (ref 30.0–36.0)
MCV: 94.4 fL (ref 78.0–100.0)
Platelets: 388 10*3/uL (ref 150–400)
RBC: 2.87 MIL/uL — ABNORMAL LOW (ref 3.87–5.11)
RDW: 16.4 % — ABNORMAL HIGH (ref 11.5–15.5)
WBC: 13.3 10*3/uL — ABNORMAL HIGH (ref 4.0–10.5)

## 2018-06-17 LAB — BASIC METABOLIC PANEL
Anion gap: 7 (ref 5–15)
BUN: 7 mg/dL (ref 6–20)
CO2: 28 mmol/L (ref 22–32)
Calcium: 8.4 mg/dL — ABNORMAL LOW (ref 8.9–10.3)
Chloride: 108 mmol/L (ref 98–111)
Creatinine, Ser: 0.78 mg/dL (ref 0.44–1.00)
GFR calc Af Amer: >60 mL/min (ref >60)
GFR calc non Af Amer: >60 mL/min (ref >60)
Glucose, Bld: 80 mg/dL (ref 70–99)
Potassium: 3.2 mmol/L — ABNORMAL LOW (ref 3.5–5.1)
Sodium: 143 mmol/L (ref 135–145)

## 2018-06-17 MED ORDER — METOPROLOL SUCCINATE ER 50 MG PO TB24: 50.0000 mg | ORAL_TABLET | Freq: Two times a day (BID) | ORAL | 0 refills | Status: DC

## 2018-06-17 MED ORDER — AMOXICILLIN-POT CLAVULANATE 875-125 MG PO TABS: 1.0000 | ORAL_TABLET | Freq: Two times a day (BID) | ORAL | Status: DC

## 2018-06-17 MED ORDER — APIXABAN 5 MG PO TABS: 5.0000 mg | ORAL_TABLET | Freq: Two times a day (BID) | ORAL | Status: DC

## 2018-06-17 MED ORDER — DEXTROSE 50 % IV SOLN
INTRAVENOUS | Status: AC
  Administered 2018-06-17: 50 mL
  Filled 2018-06-17: qty 50

## 2018-06-17 MED ORDER — ASPIRIN EC 81 MG PO TBEC: 81.0000 mg | DELAYED_RELEASE_TABLET | Freq: Every day | ORAL | 2 refills | Status: AC

## 2018-06-17 MED ORDER — TRAMADOL HCL 50 MG PO TABS: 50.0000 mg | ORAL_TABLET | Freq: Four times a day (QID) | ORAL | 0 refills | Status: DC | PRN

## 2018-06-17 NOTE — Discharge Summary (Addendum)
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 to the mesenteric fluid collection. Local anesthesia was attained by infiltration with 1% lidocaine a small dermatotomy was made. An attempt was made to guide an 18 gauge trocar needle from the skin surface into this fluid collection, however during intermittent CT imaging, it was evident that there was significant motion of the overlying small bowel resulting in only in intermittent window. That combined with the significant heaving of the abdominal wall resulted in a combination of factors that was too dangerous to proceed. This portion of the procedure was aborted. Attention was turned to the right upper quadrant perihepatic fluid collection. Repeat CT imaging was performed. A suitable skin entry site was identified and marked. The skin was again prepped and draped in standard fashion using chlorhexidine skin prep. Local anesthesia was attained by infiltration with 1% lidocaine. A small dermatotomy  was made. An 18 gauge trocar needle was advanced into the fluid collection under intermittent CT guidance. Once within the fluid collection, a 0.035 wire was advanced in the fluid collection, the needle was removed and the skin tract was dilated to 12 [place]. A Cook 12 [place] all-purpose drainage catheter was then modified with additional sideholes, advanced over the wire and formed. Aspiration yields approximately 25 mL of turbid serosanguineous ascitic fluid. Post drain placement CT imaging demonstrates a well-positioned drainage catheter lateral and posterior to the right hepatic lobe within the fluid collection. The drain was connected to JP bulb suction and secured to the skin with 0 Prolene suture and an adhesive fixation device. FINDINGS: Approximately 25 mL turbid ascitic fluid aspirated from the right perihepatic fluid collection. The intra mesenteric fluid collection is not amenable to percutaneous aspiration or drainage at this time. IMPRESSION: 1. Successful placement of a 12 French drainage catheter in the right upper quadrant perihepatic fluid collection with aspiration of turbid ascitic fluid. A sample was sent for Gram stain and culture. 2. Unsuccessful attempted aspiration/drain placement of the mesenteric fluid collection. There is no safe window to proceed. Signed, Criselda Peaches, MD [facility] Electronically Signed   By: Jacqulynn Cadet M.D.   On: 06/15/2018 17:22    Microbiology: Recent Results (from the past 240 hour(s))  Culture, blood (  routine x 2)     Status: None   Collection Time: 06/09/18  8:51 AM  Result Value Ref Range Status   Specimen Description LEFT ANTECUBITAL DRAWN BY RN  Final   Special Requests   Final    BOTTLES DRAWN AEROBIC AND ANAEROBIC Blood Culture adequate volume   Culture   Final    NO GROWTH 5 DAYS Performed at [facility], [address] [place], [place] [address]    Report Status  06/14/2018 FINAL  Final  Culture, blood (routine x 2)     Status: None   Collection Time: 06/09/18  8:56 AM  Result Value Ref Range Status   Specimen Description BLOOD LEFT HAND DRAWN BY RN  Final   Special Requests   Final    BOTTLES DRAWN AEROBIC AND ANAEROBIC Blood Culture adequate volume   Culture   Final    NO GROWTH 5 DAYS Performed at [facility], [address] [place], [place] [address]    Report Status 06/14/2018 FINAL  Final  Culture, body fluid-bottle     Status: None (Preliminary result)   Collection Time: 06/15/18  4:04 PM  Result Value Ref Range Status   Specimen Description FLUID  Final   Special Requests BOTTLES DRAWN AEROBIC AND ANAEROBIC PERIHEPATIC  Final   Culture   Final    NO GROWTH 2 DAYS Performed at [facility], [place] [address] [place], [place] [address]    Report Status PENDING  Incomplete  Gram stain     Status: None   Collection Time: 06/15/18  4:04 PM  Result Value Ref Range Status   Specimen Description FLUID  Final   Special Requests PERIHEPATIC  Final   Gram Stain   Final    MODERATE WBC PRESENT, PREDOMINANTLY PMN NO ORGANISMS SEEN Performed at [facility], [place] [address], [place], [place] [address]    Report Status 06/15/2018 FINAL  Final     Labs: Basic Metabolic Panel: Recent Labs  Lab 06/11/18 0730 06/12/18 0514 06/13/18 0547 06/15/18 0551 06/17/18 0943  NA 140 141 143 143 143  K 3.8 3.9 3.9 3.5 3.2*  CL 107 107 107 105 108  CO2 23 26 23 27 28   GLUCOSE 146* 119* 122* 104* 80  BUN 31* 25* 18 10 7   CREATININE 1.23* 1.13* 1.08* 1.01* 0.78  CALCIUM 8.7* 8.5* 8.7* 8.7* 8.4*   CBC: Recent Labs  Lab 06/13/18 0547 06/13/18 0734 06/14/18 0550 06/15/18 0551 06/17/18 0943  WBC 24.0* 23.3* 25.8* 22.1* 13.3*  NEUTROABS  --  20.6*  --   --   --   HGB 8.3* 8.3* 7.6* 8.1* 8.0*  HCT 28.3* 28.0* 26.5* 27.2* 27.1*  MCV 93.1 92.7 95.0 93.5 94.4  PLT 390 370 415* 446* 388   BNP (last 3 results) Recent Labs     06/21/17 1637 06/22/17 0337  BNP 1,152.0* 835.0*   CBG: Recent Labs  Lab 06/17/18 0757 06/17/18 0956 06/17/18 1150 06/17/18 1309 06/17/18 1543  GLUCAP 50* 98 69* 76 103*   Signed:  Barton Dubois MD.  [facility] 06/17/2018, 4:51 PM

## 2018-06-17 NOTE — Progress Notes (Signed)
Patient with perihepatic drain placed 06/15/18.   Called to [facility], discussed with RN. Patient stable. Remains afebrile, but refusing labs.  Attempting CBC today if patient allows.   Drain remains in place without issue.  Flushes easily per RN.  Mostly serous-appearing output.  45 mL recorded yesterday.   Continue current care plan.  May d/c to SNF.  Have placed follow-up and care orders.   Brynda Greathouse, MS RD PA-C 11:10 AM

## 2018-06-17 NOTE — Progress Notes (Signed)
Blood glucose 50.  Alert and oriented.  Gave D50.  Refusing lab draws.  Texted Dr. Dyann Kief with this information.
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
## 2018-06-17 NOTE — Progress Notes (Signed)
Inpatient Diabetes Program Recommendations  AACE/ADA: New Consensus Statement on Inpatient Glycemic Control (2015)  Target Ranges:  Prepandial:   less than 140 mg/dL      Peak postprandial:   less than 180 mg/dL (1-2 hours)      Critically ill patients:  140 - 180 mg/dL   Lab Results  Component Value Date   GLUCAP 98 06/17/2018   HGBA1C 7.2 (H) 02/09/2018    Review of Glycemic Control  Diabetes history: DM 2 Outpatient Diabetes medications: Levemir 8 units daily + Tradjenta 5 mg Current orders for Inpatient glycemic control: Levemir 10 units + Novolog moderate correction scale  Inpatient Diabetes Program Recommendations:   -Decrease Levemir to 6 units daily -Decrease Novolog correction scale to sensitive tid  Thank you, Nani Gasser. Benyamin Jeff, RN, MSN, CDE  Diabetes Coordinator Inpatient Glycemic Control Team Team Pager [phone] (8am-5pm) 06/17/2018 10:39 AM

## 2018-06-17 NOTE — Clinical Social Work Note (Addendum)
Patient has now decided that she does want SNF rehab. Met with her this AM to discuss. Pt requests referrals to [facility] and [facility]. Will start referrals and follow for dc planning.  2:53pm: Pt accepts bed offer from [facility]. They need authorization from pt's insurance before they can take her. If not received today, weekend csw can assist if received over the weekend. Otherwise, covering LCSW will follow up Monday. Updated RN CM and MD.

## 2018-06-18 LAB — GLUCOSE, CAPILLARY
Glucose-Capillary: 69 mg/dL — ABNORMAL LOW (ref 70–99)
Glucose-Capillary: 76 mg/dL (ref 70–99)
Glucose-Capillary: 94 mg/dL (ref 70–99)
Glucose-Capillary: 127 mg/dL — ABNORMAL HIGH (ref 70–99)

## 2018-06-18 NOTE — Progress Notes (Signed)
Pt refusing breakfast and supplements but has agreed to an [place] Ice: 21g carbs, 90 calories, 17g sugars.  Pt states no one has asked her about what she likes or would choose for her meal trays.  There is evidence in the room that dietary has been by and left information for her including a phone number to reach the dietary ambassador.

## 2018-06-18 NOTE — Progress Notes (Signed)
PROGRESS NOTE    [name]  JIR:[id number] DOB: July 14, 1966 DOA: 06/04/2018 PCP: [name], MD   Assessment & Plan:   Principal Problem:   Sepsis [facility]) Active Problems:   Alcohol abuse   Medical non-compliance   CAP (community acquired pneumonia)   CKD (chronic kidney disease) stage 3, GFR 30-59 ml/min (HCC)   Polysubstance abuse (including cocaine)   Hyperkalemia   Acute on chronic renal failure (HCC)   Chronic pain syndrome   Chronic combined systolic and diastolic CHF (congestive heart failure) ([place])   DM (diabetes mellitus), type 2, uncontrolled, with renal complications (HCC)   COPD (chronic obstructive pulmonary disease) (HCC)   AKI (acute kidney injury) ([place])   Acute lower UTI   A-fib (HCC)   Atrial fibrillation with rapid ventricular response ([place])   Acute cystitis without hematuria   Pelvic adnexal fluid collection   Atrial fibrillation, controlled ([place])   Leukocytosis   Intra-abdominal abscess (HCC)   Pressure injury of skin   1. Persistent leukocytosis with pelvic abscess. S/p IR drain placement on 8/21 for perihepatic abscess. She is on oral augmentin and has been stable.  2. A fib with RVR. Heart rate is currently stable. Continue on toprol and amiodarone. Resume eliquis once cleared by IR 3. Sepsis. Secondary to E coli UTI. Appropriately treated with antibiotics 4. Chronic combined CHF. EF 30-35%. euvolemic at this time. Continue to monitor  Patient remains stable and waiting on transfer to SNF. Please refer to Discharge summary done on 8/23 by Dr. Dyann Kief for further details. On change in plan of care since last summary   DVT prophylaxis: SCDs Code Status: full code Family Communication: no family present Disposition Plan: awaiting bed placement at SNF   Subjective: No new complaints. No worsening shortness of breath or nausea and vomiting.  Objective: Vitals:   06/17/18 2145 06/18/18 0448 06/18/18 0801 06/18/18 1419  BP: (!) 164/88  (!) 165/87  (!) 177/89  Pulse: 74 72  72  Resp: 20 14    Temp: 98.4 F (36.9 C) 98.4 F (36.9 C)  99.8 F (37.7 C)  TempSrc: Oral Oral  Oral  SpO2: 100% 100% 100% 98%  Weight:      Height:        Intake/Output Summary (Last 24 hours) at 06/18/2018 1744 Last data filed at 06/18/2018 1423 Gross per 24 hour  Intake 207 ml  Output 810 ml  Net -603 ml   Filed Weights   06/09/18 0500 06/10/18 0500 06/14/18 0607  Weight: 76.6 kg 76.8 kg 75.2 kg    Examination:  General exam: Appears calm and comfortable  Respiratory system: Clear to auscultation. Respiratory effort normal. Cardiovascular system: S1 & S2 heard, RRR. No JVD, murmurs, rubs, gallops or clicks. No pedal edema. Gastrointestinal system: Abdomen is nondistended, soft and nontender. No organomegaly or masses felt. Normal bowel sounds heard. Central nervous system: Alert and oriented. No focal neurological deficits. Extremities: Symmetric 5 x 5 power. Skin: No rashes, lesions or ulcers Psychiatry: Judgement and insight appear normal. Mood & affect appropriate.     Data Reviewed: I have personally reviewed following labs and imaging studies  CBC: Recent Labs  Lab 06/13/18 0547 06/13/18 0734 06/14/18 0550 06/15/18 0551 06/17/18 0943  WBC 24.0* 23.3* 25.8* 22.1* 13.3*  NEUTROABS  --  20.6*  --   --   --   HGB 8.3* 8.3* 7.6* 8.1* 8.0*  HCT 28.3* 28.0* 26.5* 27.2* 27.1*  MCV 93.1 92.7 95.0 93.5 94.4  PLT 390 370 415* 446* 702   Basic Metabolic Panel: Recent Labs  Lab 06/12/18 0514 06/13/18 0547 06/15/18 0551 06/17/18 0943  NA 141 143 143 143  K 3.9 3.9 3.5 3.2*  CL 107 107 105 108  CO2 26 23 27 28   GLUCOSE 119* 122* 104* 80  BUN 25* 18 10 7   CREATININE 1.13* 1.08* 1.01* 0.78  CALCIUM 8.5* 8.7* 8.7* 8.4*   GFR: Estimated Creatinine Clearance: 74.6 mL/min (by C-G formula based on SCr of 0.78 mg/dL). Liver Function Tests: No results for input(s): AST, ALT, ALKPHOS, BILITOT, PROT, ALBUMIN in the last 168  hours. No results for input(s): LIPASE, AMYLASE in the last 168 hours. No results for input(s): AMMONIA in the last 168 hours. Coagulation Profile: Recent Labs  Lab 06/14/18 1733  INR 1.33   Cardiac Enzymes: No results for input(s): CKTOTAL, CKMB, CKMBINDEX, TROPONINI in the last 168 hours. BNP (last 3 results) No results for input(s): PROBNP in the last 8760 hours. HbA1C: No results for input(s): HGBA1C in the last 72 hours. CBG: Recent Labs  Lab 06/17/18 1715 06/17/18 2130 06/18/18 0805 06/18/18 1230 06/18/18 1643  GLUCAP 118* 130* 69* 76 94   Lipid Profile: No results for input(s): CHOL, HDL, LDLCALC, TRIG, CHOLHDL, LDLDIRECT in the last 72 hours. Thyroid Function Tests: No results for input(s): TSH, T4TOTAL, FREET4, T3FREE, THYROIDAB in the last 72 hours. Anemia Panel: No results for input(s): VITAMINB12, FOLATE, FERRITIN, TIBC, IRON, RETICCTPCT in the last 72 hours. Sepsis Labs: No results for input(s): PROCALCITON, LATICACIDVEN in the last 168 hours.  Recent Results (from the past 240 hour(s))  Culture, blood (routine x 2)     Status: None   Collection Time: 06/09/18  8:51 AM  Result Value Ref Range Status   Specimen Description LEFT ANTECUBITAL DRAWN BY RN  Final   Special Requests   Final    BOTTLES DRAWN AEROBIC AND ANAEROBIC Blood Culture adequate volume   Culture   Final    NO GROWTH 5 DAYS Performed at [facility], [address] [place], [place] [address]    Report Status 06/14/2018 FINAL  Final  Culture, blood (routine x 2)     Status: None   Collection Time: 06/09/18  8:56 AM  Result Value Ref Range Status   Specimen Description BLOOD LEFT HAND DRAWN BY RN  Final   Special Requests   Final    BOTTLES DRAWN AEROBIC AND ANAEROBIC Blood Culture adequate volume   Culture   Final    NO GROWTH 5 DAYS Performed at [facility], [address], [place], [place] [address]    Report Status 06/14/2018 FINAL  Final  Culture, body fluid-bottle     Status:  None (Preliminary result)   Collection Time: 06/15/18  4:04 PM  Result Value Ref Range Status   Specimen Description FLUID  Final   Special Requests BOTTLES DRAWN AEROBIC AND ANAEROBIC PERIHEPATIC  Final   Culture   Final    NO GROWTH 3 DAYS Performed at [facility], [place] [address] [place],  [address]    Report Status PENDING  Incomplete  Gram stain     Status: None   Collection Time: 06/15/18  4:04 PM  Result Value Ref Range Status   Specimen Description FLUID  Final   Special Requests PERIHEPATIC  Final   Gram Stain   Final    MODERATE WBC PRESENT, PREDOMINANTLY PMN NO ORGANISMS SEEN Performed at [facility], [place]  [address] [place], [place] [address]    Report Status 06/15/2018 FINAL  Final         Radiology Studies: No results found.      Scheduled Meds: . amiodarone  200 mg Oral Daily  . amoxicillin-clavulanate  1 tablet Oral Q12H  . folic acid  1 mg Oral Daily  . insulin aspart  0-15 Units Subcutaneous TID WC  . insulin aspart  0-5 Units Subcutaneous QHS  . insulin detemir  10 Units Subcutaneous QHS  . metoprolol succinate  50 mg Oral BID  . multivitamin with minerals  1 tablet Oral Daily  . polyethylene glycol  17 g Oral BID  . sodium chloride flush  5 mL Intracatheter Q8H  . thiamine  100 mg Oral Daily   Or  . thiamine  100 mg Intravenous Daily  . tiotropium  18 mcg Inhalation Daily   Continuous Infusions: . sodium chloride       LOS: 14 days    Time spent: 32mins    Kathie Dike, MD [facility] Pager [phone]  If 7PM-7AM, please contact night-coverage [email or web address] Password [facility] 06/18/2018, 5:44 PM

## 2018-06-19 LAB — GLUCOSE, CAPILLARY
Glucose-Capillary: 59 mg/dL — ABNORMAL LOW (ref 70–99)
Glucose-Capillary: 61 mg/dL — ABNORMAL LOW (ref 70–99)
Glucose-Capillary: 100 mg/dL — ABNORMAL HIGH (ref 70–99)
Glucose-Capillary: 84 mg/dL (ref 70–99)
Glucose-Capillary: 77 mg/dL (ref 70–99)
Glucose-Capillary: 88 mg/dL (ref 70–99)

## 2018-06-19 MED ORDER — HYDRALAZINE HCL 25 MG PO TABS
25.0000 mg | ORAL_TABLET | Freq: Four times a day (QID) | ORAL | Status: DC
  Administered 2018-06-19 – 2018-06-20 (×4): 25 mg via ORAL
  Filled 2018-06-19 (×4): qty 1

## 2018-06-19 MED ORDER — HYDROXYZINE HCL 10 MG PO TABS
10.0000 mg | ORAL_TABLET | Freq: Three times a day (TID) | ORAL | Status: DC | PRN
  Administered 2018-06-19 (×2): 10 mg via ORAL
  Filled 2018-06-19 (×2): qty 1

## 2018-06-19 NOTE — Progress Notes (Signed)
Patient has not received authorization.  CSW will continue to follow up with facility.  Reed Breech LCSWA [phone]

## 2018-06-19 NOTE — Progress Notes (Signed)
PT refusing to turn, help pull self up, refuses to reach for personal items, only lays on R side. PT educated on independence and importance of moving and using strength and muscles to return to her baseline for rehab. PT irritable and agitated

## 2018-06-19 NOTE — Progress Notes (Signed)
Patient ID: [name], female   DOB: Sep 21, 1966, 52 y.o.   MRN: [id number]   Perihepatic abscess drain placed in IR 8/21  Wbc now 13 today Afeb OP of drain minimal per chart  Pt has IR Drain Clinic orders in place Will need to flush drain 10 cc daily at SNF Record OP  Will plan for CT and drain eval approx 1 week  Spoke to [employer]

## 2018-06-19 NOTE — Progress Notes (Addendum)
Lab called and gram pos cocci clusters from body fluid sample.  Patient alert and oriented today, more friendly and talking but still not wanting to eat. Texted DR. [place] concerning lab

## 2018-06-19 NOTE — Progress Notes (Signed)
PROGRESS NOTE    [name]  PPJ:[id number] DOB: 05-Sep-1966 DOA: 06/04/2018 PCP: [name], MD   Assessment & Plan:   Principal Problem:   Sepsis [facility]) Active Problems:   Alcohol abuse   Medical non-compliance   CAP (community acquired pneumonia)   CKD (chronic kidney disease) stage 3, GFR 30-59 ml/min (HCC)   Polysubstance abuse (including cocaine)   Hyperkalemia   Acute on chronic renal failure (HCC)   Chronic pain syndrome   Chronic combined systolic and diastolic CHF (congestive heart failure) ([place])   DM (diabetes mellitus), type 2, uncontrolled, with renal complications (HCC)   COPD (chronic obstructive pulmonary disease) (HCC)   AKI (acute kidney injury) ([place])   Acute lower UTI   A-fib (HCC)   Atrial fibrillation with rapid ventricular response ([place])   Acute cystitis without hematuria   Pelvic adnexal fluid collection   Atrial fibrillation, controlled ([place])   Leukocytosis   Intra-abdominal abscess (HCC)   Pressure injury of skin   1. Persistent leukocytosis with pelvic abscess. S/p IR drain placement on 8/21 for perihepatic abscess.  Patient continued to tolerate oral antibiotics and has remained stable.  Denies any nausea vomiting.  Abdominal pain continued to improve.   2. A fib with RVR. Heart rate is currently stable and controlled. Continue on toprol and amiodarone. Resume eliquis at discharge. 3. Sepsis. Secondary to E coli UTI. Appropriately treated with antibiotics 4. Chronic combined CHF. EF 30-35%. euvolemic at this time. Continue to monitor. Advised to follow low sodium diet. Continue daily weights.   Patient remains stable and waiting on transfer to SNF. Please refer to Discharge summary done on 8/23 by myself for further details. No changes in plan of care since last summary. Awaiting insurance authorization.   DVT prophylaxis: SCDs Code Status: full code Family Communication: No family at bedside. Disposition Plan: awaiting bed  placement at SNF   Subjective: No fever, no chest pain, no shortness of breath.  Patient denies any nausea vomiting.  Objective: Vitals:   06/18/18 1419 06/18/18 2213 06/19/18 0533 06/19/18 0816  BP: (!) 177/89 (!) 167/95 (!) 185/76   Pulse: 72 80 70   Resp:  16 16   Temp: 99.8 F (37.7 C) 99.3 F (37.4 C) 98.8 F (37.1 C)   TempSrc: Oral Oral Oral   SpO2: 98% 97% 95% 93%  Weight:      Height:        Intake/Output Summary (Last 24 hours) at 06/19/2018 1321 Last data filed at 06/19/2018 1300 Gross per 24 hour  Intake 1385 ml  Output 606 ml  Net 779 ml   Filed Weights   06/09/18 0500 06/10/18 0500 06/14/18 0607  Weight: 76.6 kg 76.8 kg 75.2 kg    Examination: General exam: Alert, awake, oriented x 3; in no acute distress.  Patient is afebrile and overall appears comfortable.  She reports having some decreased appetite and not liking hospital food. Respiratory system: Clear to auscultation. Respiratory effort normal. Cardiovascular system:RRR. No murmurs, rubs, gallops. Gastrointestinal system: Abdomen is nondistended, soft and with just mild tenderness around place drain in her right upper quadrant.  No organomegaly or masses felt. Normal bowel sounds heard. Central nervous system: Alert and oriented. No focal neurological deficits. Extremities: No C/C/E, +pedal pulses Skin: No rashes, lesions or ulcers.  Right upper quadrant drain in place without any signs of superimposed infection. Psychiatry: Judgement and insight appear normal. Mood & affect appropriate.    Data Reviewed: I have personally reviewed  following labs and imaging studies  CBC: Recent Labs  Lab 06/13/18 0547 06/13/18 0734 06/14/18 0550 06/15/18 0551 06/17/18 0943  WBC 24.0* 23.3* 25.8* 22.1* 13.3*  NEUTROABS  --  20.6*  --   --   --   HGB 8.3* 8.3* 7.6* 8.1* 8.0*  HCT 28.3* 28.0* 26.5* 27.2* 27.1*  MCV 93.1 92.7 95.0 93.5 94.4  PLT 390 370 415* 446* 161   Basic Metabolic Panel: Recent Labs    Lab 06/13/18 0547 06/15/18 0551 06/17/18 0943  NA 143 143 143  K 3.9 3.5 3.2*  CL 107 105 108  CO2 23 27 28   GLUCOSE 122* 104* 80  BUN 18 10 7   CREATININE 1.08* 1.01* 0.78  CALCIUM 8.7* 8.7* 8.4*   GFR: Estimated Creatinine Clearance: 74.6 mL/min (by C-G formula based on SCr of 0.78 mg/dL).   Recent Labs  Lab 06/14/18 1733  INR 1.33   CBG: Recent Labs  Lab 06/18/18 2132 06/19/18 0743 06/19/18 0809 06/19/18 1002 06/19/18 1119  GLUCAP 127* 61* 59* 100* 84    Recent Results (from the past 240 hour(s))  Culture, body fluid-bottle     Status: None   Collection Time: 06/15/18  4:04 PM  Result Value Ref Range Status   Specimen Description FLUID  Final   Special Requests BOTTLES DRAWN AEROBIC AND ANAEROBIC PERIHEPATIC  Final   Gram Stain   Final    FEW WBC PRESENT, PREDOMINANTLY PMN MODERATE GRAM POSITIVE COCCI IN CLUSTERS    Culture   Final    NO GROWTH 3 DAYS Performed at [facility], [place] [address] [place], [place] [address]    Report Status 06/19/2018 FINAL  Final  Gram stain     Status: None   Collection Time: 06/15/18  4:04 PM  Result Value Ref Range Status   Specimen Description FLUID  Final   Special Requests PERIHEPATIC  Final   Gram Stain   Final    MODERATE WBC PRESENT, PREDOMINANTLY PMN NO ORGANISMS SEEN Performed at [facility], [place] [address], [place], [place] [address]    Report Status 06/15/2018 FINAL  Final     Radiology Studies: No results found.   Scheduled Meds: . amiodarone  200 mg Oral Daily  . amoxicillin-clavulanate  1 tablet Oral Q12H  . folic acid  1 mg Oral Daily  . hydrALAZINE  25 mg Oral Q6H  . insulin aspart  0-15 Units Subcutaneous TID WC  . insulin aspart  0-5 Units Subcutaneous QHS  . insulin detemir  10 Units Subcutaneous QHS  . metoprolol succinate  50 mg Oral BID  . multivitamin with minerals  1 tablet Oral Daily  . polyethylene glycol  17 g Oral BID  . sodium chloride flush  5 mL Intracatheter  Q8H  . thiamine  100 mg Oral Daily   Or  . thiamine  100 mg Intravenous Daily  . tiotropium  18 mcg Inhalation Daily   Continuous Infusions: . sodium chloride       LOS: 15 days    Time spent: 15 mins    Barton Dubois, MD [facility] Pager [phone]  If 7PM-7AM, please contact night-coverage [email or web address] Password [email or web address] 06/19/2018, 1:21 PM

## 2018-06-20 DIAGNOSIS — I5020 Unspecified systolic (congestive) heart failure: Secondary | ICD-10-CM | POA: Diagnosis not present

## 2018-06-20 DIAGNOSIS — I959 Hypotension, unspecified: Secondary | ICD-10-CM | POA: Diagnosis not present

## 2018-06-20 DIAGNOSIS — M6281 Muscle weakness (generalized): Secondary | ICD-10-CM | POA: Diagnosis not present

## 2018-06-20 DIAGNOSIS — D72829 Elevated white blood cell count, unspecified: Secondary | ICD-10-CM | POA: Diagnosis not present

## 2018-06-20 DIAGNOSIS — L98499 Non-pressure chronic ulcer of skin of other sites with unspecified severity: Secondary | ICD-10-CM | POA: Diagnosis not present

## 2018-06-20 DIAGNOSIS — F332 Major depressive disorder, recurrent severe without psychotic features: Secondary | ICD-10-CM | POA: Diagnosis present

## 2018-06-20 DIAGNOSIS — G894 Chronic pain syndrome: Secondary | ICD-10-CM | POA: Diagnosis not present

## 2018-06-20 DIAGNOSIS — Z794 Long term (current) use of insulin: Secondary | ICD-10-CM | POA: Diagnosis not present

## 2018-06-20 DIAGNOSIS — I482 Chronic atrial fibrillation: Secondary | ICD-10-CM | POA: Diagnosis not present

## 2018-06-20 DIAGNOSIS — N183 Chronic kidney disease, stage 3 (moderate): Secondary | ICD-10-CM | POA: Diagnosis not present

## 2018-06-20 DIAGNOSIS — Z7982 Long term (current) use of aspirin: Secondary | ICD-10-CM | POA: Diagnosis not present

## 2018-06-20 DIAGNOSIS — K651 Peritoneal abscess: Secondary | ICD-10-CM | POA: Diagnosis not present

## 2018-06-20 DIAGNOSIS — A4151 Sepsis due to Escherichia coli [E. coli]: Secondary | ICD-10-CM | POA: Diagnosis not present

## 2018-06-20 DIAGNOSIS — J449 Chronic obstructive pulmonary disease, unspecified: Secondary | ICD-10-CM | POA: Diagnosis not present

## 2018-06-20 DIAGNOSIS — I480 Paroxysmal atrial fibrillation: Secondary | ICD-10-CM | POA: Diagnosis not present

## 2018-06-20 DIAGNOSIS — N1330 Unspecified hydronephrosis: Secondary | ICD-10-CM | POA: Diagnosis not present

## 2018-06-20 DIAGNOSIS — E876 Hypokalemia: Secondary | ICD-10-CM | POA: Diagnosis not present

## 2018-06-20 DIAGNOSIS — A4189 Other specified sepsis: Secondary | ICD-10-CM | POA: Diagnosis not present

## 2018-06-20 DIAGNOSIS — E1140 Type 2 diabetes mellitus with diabetic neuropathy, unspecified: Secondary | ICD-10-CM | POA: Diagnosis not present

## 2018-06-20 DIAGNOSIS — E785 Hyperlipidemia, unspecified: Secondary | ICD-10-CM | POA: Diagnosis not present

## 2018-06-20 DIAGNOSIS — F1410 Cocaine abuse, uncomplicated: Secondary | ICD-10-CM | POA: Diagnosis present

## 2018-06-20 DIAGNOSIS — D649 Anemia, unspecified: Secondary | ICD-10-CM | POA: Diagnosis not present

## 2018-06-20 DIAGNOSIS — E118 Type 2 diabetes mellitus with unspecified complications: Secondary | ICD-10-CM | POA: Diagnosis not present

## 2018-06-20 DIAGNOSIS — D62 Acute posthemorrhagic anemia: Secondary | ICD-10-CM | POA: Diagnosis not present

## 2018-06-20 DIAGNOSIS — Z87891 Personal history of nicotine dependence: Secondary | ICD-10-CM | POA: Diagnosis not present

## 2018-06-20 DIAGNOSIS — I428 Other cardiomyopathies: Secondary | ICD-10-CM | POA: Diagnosis not present

## 2018-06-20 DIAGNOSIS — Z79899 Other long term (current) drug therapy: Secondary | ICD-10-CM | POA: Diagnosis not present

## 2018-06-20 DIAGNOSIS — N189 Chronic kidney disease, unspecified: Secondary | ICD-10-CM | POA: Diagnosis not present

## 2018-06-20 DIAGNOSIS — N179 Acute kidney failure, unspecified: Secondary | ICD-10-CM | POA: Diagnosis not present

## 2018-06-20 DIAGNOSIS — I10 Essential (primary) hypertension: Secondary | ICD-10-CM | POA: Diagnosis not present

## 2018-06-20 DIAGNOSIS — R262 Difficulty in walking, not elsewhere classified: Secondary | ICD-10-CM | POA: Diagnosis not present

## 2018-06-20 DIAGNOSIS — E1122 Type 2 diabetes mellitus with diabetic chronic kidney disease: Secondary | ICD-10-CM | POA: Diagnosis not present

## 2018-06-20 DIAGNOSIS — N390 Urinary tract infection, site not specified: Secondary | ICD-10-CM | POA: Diagnosis not present

## 2018-06-20 DIAGNOSIS — N2589 Other disorders resulting from impaired renal tubular function: Secondary | ICD-10-CM | POA: Diagnosis not present

## 2018-06-20 DIAGNOSIS — R338 Other retention of urine: Secondary | ICD-10-CM | POA: Diagnosis not present

## 2018-06-20 DIAGNOSIS — I5042 Chronic combined systolic (congestive) and diastolic (congestive) heart failure: Secondary | ICD-10-CM | POA: Diagnosis not present

## 2018-06-20 DIAGNOSIS — F1910 Other psychoactive substance abuse, uncomplicated: Secondary | ICD-10-CM | POA: Diagnosis not present

## 2018-06-20 DIAGNOSIS — R310 Gross hematuria: Secondary | ICD-10-CM | POA: Diagnosis not present

## 2018-06-20 DIAGNOSIS — K551 Chronic vascular disorders of intestine: Secondary | ICD-10-CM | POA: Diagnosis not present

## 2018-06-20 DIAGNOSIS — Z8673 Personal history of transient ischemic attack (TIA), and cerebral infarction without residual deficits: Secondary | ICD-10-CM | POA: Diagnosis not present

## 2018-06-20 DIAGNOSIS — K650 Generalized (acute) peritonitis: Secondary | ICD-10-CM | POA: Diagnosis not present

## 2018-06-20 DIAGNOSIS — D500 Iron deficiency anemia secondary to blood loss (chronic): Secondary | ICD-10-CM | POA: Diagnosis not present

## 2018-06-20 DIAGNOSIS — E875 Hyperkalemia: Secondary | ICD-10-CM | POA: Diagnosis not present

## 2018-06-20 DIAGNOSIS — I509 Heart failure, unspecified: Secondary | ICD-10-CM | POA: Diagnosis not present

## 2018-06-20 DIAGNOSIS — I130 Hypertensive heart and chronic kidney disease with heart failure and stage 1 through stage 4 chronic kidney disease, or unspecified chronic kidney disease: Secondary | ICD-10-CM | POA: Diagnosis not present

## 2018-06-20 DIAGNOSIS — J189 Pneumonia, unspecified organism: Secondary | ICD-10-CM | POA: Diagnosis not present

## 2018-06-20 DIAGNOSIS — I5030 Unspecified diastolic (congestive) heart failure: Secondary | ICD-10-CM | POA: Diagnosis not present

## 2018-06-20 DIAGNOSIS — Z7901 Long term (current) use of anticoagulants: Secondary | ICD-10-CM | POA: Diagnosis not present

## 2018-06-20 DIAGNOSIS — I4891 Unspecified atrial fibrillation: Secondary | ICD-10-CM | POA: Diagnosis not present

## 2018-06-20 LAB — GLUCOSE, CAPILLARY
Glucose-Capillary: 113 mg/dL — ABNORMAL HIGH (ref 70–99)
Glucose-Capillary: 171 mg/dL — ABNORMAL HIGH (ref 70–99)

## 2018-06-20 LAB — BASIC METABOLIC PANEL
Anion gap: 8 (ref 5–15)
BUN: 7 mg/dL (ref 6–20)
CO2: 30 mmol/L (ref 22–32)
Calcium: 8.2 mg/dL — ABNORMAL LOW (ref 8.9–10.3)
Chloride: 107 mmol/L (ref 98–111)
Creatinine, Ser: 0.93 mg/dL (ref 0.44–1.00)
GFR calc Af Amer: >60 mL/min (ref >60)
GFR calc non Af Amer: >60 mL/min (ref >60)
Glucose, Bld: 133 mg/dL — ABNORMAL HIGH (ref 70–99)
Potassium: 3.2 mmol/L — ABNORMAL LOW (ref 3.5–5.1)
Sodium: 145 mmol/L (ref 135–145)

## 2018-06-20 LAB — CBC
HCT: 26.9 % — ABNORMAL LOW (ref 36.0–46.0)
Hemoglobin: 7.7 g/dL — ABNORMAL LOW (ref 12.0–15.0)
MCH: 27.5 pg (ref 26.0–34.0)
MCHC: 28.6 g/dL — ABNORMAL LOW (ref 30.0–36.0)
MCV: 96.1 fL (ref 78.0–100.0)
Platelets: 433 10*3/uL — ABNORMAL HIGH (ref 150–400)
RBC: 2.8 MIL/uL — ABNORMAL LOW (ref 3.87–5.11)
RDW: 16.4 % — ABNORMAL HIGH (ref 11.5–15.5)
WBC: 10.2 10*3/uL (ref 4.0–10.5)

## 2018-06-20 MED ORDER — AMOXICILLIN-POT CLAVULANATE 875-125 MG PO TABS: 1.0000 | ORAL_TABLET | Freq: Two times a day (BID) | ORAL | 0 refills | Status: AC

## 2018-06-20 NOTE — Clinical Social Work Placement (Signed)
   CLINICAL SOCIAL WORK PLACEMENT  NOTE  Date:  06/20/2018  Patient Details  Name: [name] MRN: [id number] Date of Birth: January 21, 1966  Clinical Social Work is seeking post-discharge placement for this patient at the [place] level of care (*CSW will initial, date and re-position this form in  chart as items are completed):  Yes   Patient/family provided with [facility] Work Department's list of facilities offering this level of care within the geographic area requested by the patient (or if unable, by the patient's family).  Yes   Patient/family informed of their freedom to choose among providers that offer the needed level of care, that participate in Medicare, Medicaid or managed care program needed by the patient, have an available bed and are willing to accept the patient.  Yes   Patient/family informed of [facility]'s ownership interest in [facility] and [facility], as well as of the fact that they are under no obligation to receive care at these facilities.  PASRR submitted to EDS on       PASRR number received on       Existing PASRR number confirmed on 06/17/18     FL2 transmitted to all facilities in geographic area requested by pt/family on 06/17/18     FL2 transmitted to all facilities within larger geographic area on       Patient informed that his/her managed care company has contracts with or will negotiate with certain facilities, including the following:        Yes   Patient/family informed of bed offers received.  Patient chooses bed at Other - please specify in the comment section below:([facility]is)     Physician recommends and patient chooses bed at      Patient to be transferred to Other - please specify in the comment section below:([facility]is) on 06/20/18.  Patient to be transferred to facility by Wynonia Lawman     Patient family notified on 06/20/18 of transfer.  Name of family member notified:  Sister, [name]      PHYSICIAN       Additional Comment:  Facility received authorization. Updated discharge clinicals sent to facility. LCSW signing off.   _______________________________________________ Ihor Gully, LCSW 06/20/2018, 3:32 PM

## 2018-06-20 NOTE — Progress Notes (Signed)
PROGRESS NOTE    [name]  HWE:[id number] DOB: [birth year] DOA: 06/04/2018 PCP: [name], MD   Assessment & Plan:   Principal Problem:   Sepsis [facility]) Active Problems:   Alcohol abuse   Medical non-compliance   CAP (community acquired pneumonia)   CKD (chronic kidney disease) stage 3, GFR 30-59 ml/min (HCC)   Polysubstance abuse (including cocaine)   Hyperkalemia   Acute on chronic renal failure (HCC)   Chronic pain syndrome   Chronic combined systolic and diastolic CHF (congestive heart failure) ([place])   DM (diabetes mellitus), type 2, uncontrolled, with renal complications (HCC)   COPD (chronic obstructive pulmonary disease) (HCC)   AKI (acute kidney injury) ([place])   Acute lower UTI   A-fib (HCC)   Atrial fibrillation with rapid ventricular response ([place])   Acute cystitis without hematuria   Pelvic adnexal fluid collection   Atrial fibrillation, controlled ([place])   Leukocytosis   Intra-abdominal abscess (HCC)   Pressure injury of skin   1. Persistent leukocytosis with pelvic abscess. S/p IR drain placement on 8/21 for perihepatic abscess.  Patient continued to tolerate oral antibiotics and has remained stable.  Denies any nausea vomiting.  Abdominal pain continued to improve.  Patient will follow-up with IR as an outpatient with intention to repeat CT scan in 1 week to provide definitive decisions about when to pull drain. 2. A fib with RVR. Heart rate is currently stable and controlled. Continue on toprol and amiodarone. Resume eliquis at discharge. 3. Sepsis. Secondary to E coli UTI. Appropriately treated with antibiotics 4. Chronic combined CHF. EF 30-35%. euvolemic at this time. Continue to monitor. Advised to follow low sodium diet. Continue daily weights.  5. Stage I-II pressure injury: Appreciated in her sacrum area.  Continue preventive measurements.  No signs of superimposed infection.  Patient remains stable and waiting on transfer to SNF. Please  refer to Discharge summary done on 8/23 by myself for further details. No changes in plan of care since last summary. Awaiting insurance authorization.   DVT prophylaxis: SCDs Code Status: full code Family Communication: No family at bedside. Disposition Plan: Received authorization from her insurance and will be discharged to skilled nursing facility for further care and rehabilitation.   Subjective: No fever, no chest pain, no shortness of breath.  Patient denies any nausea vomiting.  Objective: Vitals:   06/19/18 1354 06/19/18 1853 06/19/18 2229 06/20/18 0600  BP: (!) 162/105 (!) 155/90 (!) 149/82 (!) 146/84  Pulse: 75 70 70 71  Resp: 16  17   Temp: 98.9 F (37.2 C)  98.4 F (36.9 C)   TempSrc: Oral  Oral   SpO2: 100%  95%   Weight:      Height:        Intake/Output Summary (Last 24 hours) at 06/20/2018 1520 Last data filed at 06/20/2018 1000 Gross per 24 hour  Intake 385 ml  Output 150 ml  Net 235 ml   Filed Weights   06/09/18 0500 06/10/18 0500 06/14/18 0607  Weight: 76.6 kg 76.8 kg 75.2 kg    Examination: General exam: Alert, awake, oriented x 3, afebrile, no acute distress.  Patient appears comfortable reports just some mild abdominal discomfort around placed drain. Respiratory system: Clear to auscultation. Respiratory effort normal. Cardiovascular system:RRR. No murmurs, rubs, gallops. Gastrointestinal system: Abdomen is nondistended, soft and with just mild tenderness around place drain in her right upper quadrant. No organomegaly or masses felt. Normal bowel sounds heard. Central nervous system: Alert and  oriented. No focal neurological deficits. Extremities: No C/C/E, +pedal pulses Skin: Stage I-II pressure injury in her sacral area, without signs of superimposed infection. Psychiatry: Judgement and insight appear normal. Mood & affect appropriate.   Data Reviewed: I have personally reviewed following labs and imaging studies  CBC: Recent Labs  Lab  06/14/18 0550 06/15/18 0551 06/17/18 0943 06/20/18 0550  WBC 25.8* 22.1* 13.3* 10.2  HGB 7.6* 8.1* 8.0* 7.7*  HCT 26.5* 27.2* 27.1* 26.9*  MCV 95.0 93.5 94.4 96.1  PLT 415* 446* 388 332*   Basic Metabolic Panel: Recent Labs  Lab 06/15/18 0551 06/17/18 0943 06/20/18 0550  NA 143 143 145  K 3.5 3.2* 3.2*  CL 105 108 107  CO2 27 28 30   GLUCOSE 104* 80 133*  BUN 10 7 7   CREATININE 1.01* 0.78 0.93  CALCIUM 8.7* 8.4* 8.2*   GFR: Estimated Creatinine Clearance: 64.2 mL/min (by C-G formula based on SCr of 0.93 mg/dL).   Recent Labs  Lab 06/14/18 1733  INR 1.33   CBG: Recent Labs  Lab 06/19/18 1002 06/19/18 1119 06/19/18 1641 06/19/18 2231 06/20/18 0805  GLUCAP 100* 84 77 88 113*    Recent Results (from the past 240 hour(s))  Culture, body fluid-bottle     Status: None (Preliminary result)   Collection Time: 06/15/18  4:04 PM  Result Value Ref Range Status   Specimen Description FLUID  Final   Special Requests BOTTLES DRAWN AEROBIC AND ANAEROBIC PERIHEPATIC  Final   Gram Stain   Final    FEW WBC PRESENT, PREDOMINANTLY PMN MODERATE GRAM POSITIVE COCCI IN CLUSTERS Gram Stain Report Called to,Read Back By and Verified With: JACKSON,N. AT 9518 ON 06/19/2018 BY EVA Performed at [facility], [address], [place], [place] [address]    Culture [facility] POSITIVE COCCI  Final   Report Status PENDING  Incomplete  Gram stain     Status: None   Collection Time: 06/15/18  4:04 PM  Result Value Ref Range Status   Specimen Description FLUID  Final   Special Requests PERIHEPATIC  Final   Gram Stain   Final    MODERATE WBC PRESENT, PREDOMINANTLY PMN NO ORGANISMS SEEN Performed at [facility], [address], [place], [place] [address]    Report Status 06/15/2018 FINAL  Final     Radiology Studies: No results found.   Scheduled Meds: . amiodarone  200 mg Oral Daily  . amoxicillin-clavulanate  1 tablet Oral Q12H  . folic acid  1 mg Oral Daily  . hydrALAZINE   25 mg Oral Q6H  . insulin aspart  0-15 Units Subcutaneous TID WC  . insulin aspart  0-5 Units Subcutaneous QHS  . insulin detemir  10 Units Subcutaneous QHS  . metoprolol succinate  50 mg Oral BID  . multivitamin with minerals  1 tablet Oral Daily  . polyethylene glycol  17 g Oral BID  . sodium chloride flush  5 mL Intracatheter Q8H  . thiamine  100 mg Oral Daily   Or  . thiamine  100 mg Intravenous Daily  . tiotropium  18 mcg Inhalation Daily   Continuous Infusions: . sodium chloride       LOS: 16 days    Time spent: 15 mins    Barton Dubois, MD [facility] Pager [phone]  If 7PM-7AM, please contact night-coverage [email or web address] Password [email or web address] 06/20/2018, 3:20 PM

## 2018-06-20 NOTE — Clinical Social Work Note (Signed)
Facility still does not have authorization for patient. Facility sent the insurance company additional clinicals.   [name], Clydene Pugh, LCSW

## 2018-06-20 NOTE — Progress Notes (Signed)
PT Cancellation Note  Patient Details Name: SELMA MINK MRN: [id number] DOB: July 31, 1966   Cancelled Treatment:    Reason Eval/Treat Not Completed: Patient declined, no reason specified.  Patient declined therapy secondary to c/o fatigue - RN notified.   3:26 PM, 06/20/18 Lonell Grandchild, MPT Physical Therapist with [facility] 336 [phone] office [phone] mobile phone

## 2018-06-20 NOTE — Care Management Important Message (Signed)
Important Message  Patient Details  Name: KANIA REGNIER MRN: [id number] Date of Birth: [birth year]   Medicare Important Message Given:  Yes    Shelda Altes 06/20/2018, 11:35 AM

## 2018-06-21 ENCOUNTER — Other Ambulatory Visit: Payer: Self-pay | Admitting: General Surgery

## 2018-06-21 DIAGNOSIS — K651 Peritoneal abscess: Secondary | ICD-10-CM

## 2018-06-21 LAB — CULTURE, BODY FLUID W GRAM STAIN -BOTTLE

## 2018-06-22 ENCOUNTER — Telehealth: Payer: Self-pay | Admitting: *Deleted

## 2018-06-22 NOTE — Telephone Encounter (Signed)
Called [facility] in [place] and s/w Ramona made her aware of Ms. Mattila drain f/u appontment. Also faxed the info to Ramona./vm

## 2018-06-24 ENCOUNTER — Encounter: Payer: Self-pay | Admitting: Cardiology

## 2018-06-28 ENCOUNTER — Encounter: Payer: Self-pay | Admitting: *Deleted

## 2018-06-28 ENCOUNTER — Institutional Professional Consult (permissible substitution): Payer: Self-pay | Admitting: Cardiology

## 2018-06-28 DIAGNOSIS — I10 Essential (primary) hypertension: Secondary | ICD-10-CM | POA: Diagnosis not present

## 2018-06-28 DIAGNOSIS — I5030 Unspecified diastolic (congestive) heart failure: Secondary | ICD-10-CM | POA: Diagnosis not present

## 2018-06-28 DIAGNOSIS — I5020 Unspecified systolic (congestive) heart failure: Secondary | ICD-10-CM | POA: Diagnosis not present

## 2018-06-28 DIAGNOSIS — I428 Other cardiomyopathies: Secondary | ICD-10-CM | POA: Diagnosis not present

## 2018-06-28 NOTE — Progress Notes (Deleted)
Electrophysiology Office Note   Date:  06/28/2018   ID:  [name], DOB 12/05/1965, MRN [id number]  PCP:  Barbara Helper, MD  Cardiologist:  [name] Primary Electrophysiologist:  Barbara Fohl Meredith Leeds, MD    No chief complaint on file.    History of Present Illness: [name] is a 52 y.o. female who is being seen today for the evaluation of CHF at the request of Barbara Commons, MD. Presenting today for electrophysiology evaluation.  She has a history of nonischemic cardiomyopathy, CHF with an EF of 20 to 25%, CVA, CKD stage III, diabetes, polysubstance abuse with alcohol and cocaine, COPD, sleep apnea not on CPAP, and hypertension.   Today, she denies*** symptoms of palpitations, chest pain, shortness of breath, orthopnea, PND, lower extremity edema, claudication, dizziness, presyncope, syncope, bleeding, or neurologic sequela. The patient is tolerating medications without difficulties.    Past Medical History:  Diagnosis Date  . Alcohol use   . Ankle fracture, lateral malleolus, closed 12/30/2011  . Anxiety   . Breast mass, left 12/15/2011  . Chronic anemia   . Chronic combined systolic and diastolic CHF (congestive heart failure) ([place])   . CKD (chronic kidney disease), stage III ([place])    pt denies  . Cocaine abuse ([place])   . COPD (chronic obstructive pulmonary disease) ([place])   . Diabetes mellitus, type 2 ([place])   . Diabetic Charcot foot ([place]) 12/30/2011  . Essential hypertension   . History of GI bleed 12/05/2009   Qualifier: Diagnosis of  By: Zeb Comfort    . Hyperlipidemia   . Hypertensive cardiomyopathy ([place]) 11/08/2012   a. 05/2016: echo showing EF of 20-25% with normal cors by cath in 07/2016.  Marland Kitchen Noncompliance with medication regimen   . Obesity   . Panic attacks   . PAT (paroxysmal atrial tachycardia) ([place])    a. started on amiodarone 07/2017 for this.  . Sleep apnea    does not use cpap  . Stroke ([place])    06/24/17  . Tobacco abuse   .  Urinary incontinence    Past Surgical History:  Procedure Laterality Date  . BREAST BIOPSY    . CARDIAC CATHETERIZATION N/A 07/28/2016   Procedure: Left Heart Cath and Coronary Angiography;  Surgeon: Jettie Booze, MD;  Location: [facility];  Service: Cardiovascular;  Laterality: N/A;  . DILATION AND CURETTAGE OF UTERUS    . I&D EXTREMITY Bilateral 09/22/2017   Procedure: BILATERAL DEBRIDEMENT LEG/FOOT ULCERS, APPLY VERAFLO WOUND VAC;  Surgeon: Newt Minion, MD;  Location: [facility];  Service: Orthopedics;  Laterality: Bilateral;  . SKIN SPLIT GRAFT Bilateral 09/28/2017   Procedure: BILATERAL SPLIT THICKNESS SKIN GRAFT LEGS/FEET AND APPLY VAC;  Surgeon: Newt Minion, MD;  Location: [facility];  Service: Orthopedics;  Laterality: Bilateral;     Current Outpatient Medications  Medication Sig Dispense Refill  . albuterol (PROAIR HFA) 108 (90 Base) MCG/ACT inhaler INHALE 2 PUFFS EVERY 6 HOURS AS NEEDED FOR SHORTNESS OF BREATH/WHEEZING. 8.5 g 2  . amiodarone (PACERONE) 200 MG tablet Take 1 tablet (200 mg total) by mouth 2 (two) times daily. (Patient not taking: Reported on 06/08/2018) 60 tablet 2  . amLODipine (NORVASC) 2.5 MG tablet Take 1 tablet (2.5 mg total) by mouth daily. 30 tablet 5  . apixaban (ELIQUIS) 5 MG TABS tablet Take 1 tablet (5 mg total) by mouth 2 (two) times daily.    Marland Kitchen aspirin EC 81 MG tablet Take 1 tablet (81  mg total) by mouth daily. 150 tablet 2  . ezetimibe (ZETIA) 10 MG tablet TAKE 1 TABLET BY MOUTH ONCE A DAY. 30 tablet 6  . FLUoxetine (PROZAC) 20 MG capsule TAKE 1 CAPSULE BY MOUTH EVERY DAY. 30 capsule 0  . furosemide (LASIX) 20 MG tablet TAKE 1 TABLET BY MOUTH ONCE A DAY. 30 tablet 0  . gabapentin (NEURONTIN) 300 MG capsule Take 1 capsule (300 mg total) by mouth 3 (three) times daily. 3 times a day when necessary neuropathy pain (Patient taking differently: Take by mouth 3 (three) times daily. ) 90 capsule 3  . insulin detemir (LEVEMIR) 100 UNIT/ML injection  Inject 0.08 mLs (8 Units total) into the skin at bedtime. 10 mL 0  . ipratropium-albuterol (DUONEB) 0.5-2.5 (3) MG/3ML SOLN Take 3 mLs by nebulization every 6 (six) hours as needed (wheezes or SOB).     Marland Kitchen linagliptin (TRADJENTA) 5 MG TABS tablet Take 1 tablet (5 mg total) by mouth daily. 30 tablet 5  . metoprolol succinate (TOPROL-XL) 50 MG 24 hr tablet Take 1 tablet (50 mg total) by mouth 2 (two) times daily. Take with or immediately following a meal. 60 tablet 0  . montelukast (SINGULAIR) 10 MG tablet TAKE (1) TABLET BY MOUTH AT BEDTIME. (Patient taking differently: Take 10 mg by mouth at bedtime. TAKE (1) TABLET BY MOUTH AT BEDTIME.) 30 tablet 6  . pantoprazole (PROTONIX) 40 MG tablet Take 1 tablet (40 mg total) by mouth daily. 90 tablet 1  . potassium chloride SA (K-DUR,KLOR-CON) 20 MEQ tablet TAKE 1&1/2 TABLET BY MOUTH DAILY. (Patient taking differently: Take 30 mEq by mouth daily. ) 45 tablet 6  . rosuvastatin (CRESTOR) 10 MG tablet TAKE 1 TABLET BY MOUTH ONCE A DAY. 30 tablet 6  . tiotropium (SPIRIVA HANDIHALER) 18 MCG inhalation capsule Place 18 mcg into inhaler and inhale daily. 2 puffs    . traMADol (ULTRAM) 50 MG tablet Take 1 tablet (50 mg total) by mouth every 6 (six) hours as needed for severe pain. 20 tablet 0  . traZODone (DESYREL) 50 MG tablet TAKE (1) TABLET BY MOUTH AT BEDTIME. 30 tablet 2   No current facility-administered medications for this visit.     Allergies:   Patient has no known allergies.   Social History:  The patient  reports that she quit smoking about 12 months ago. Her smoking use included cigarettes. She has a 20.00 pack-year smoking history. She has never used smokeless tobacco. She reports that she has current or past drug history. Drugs: Marijuana and Cocaine. Frequency: 3.00 times per week. She reports that she does not drink alcohol.   Family History:  The patient's family history includes Arthritis in her unknown relative; Asthma in her unknown relative;  Cancer in her sister and unknown relative; Diabetes in her unknown relative; Heart attack in her mother; Hypertension in her brother, daughter, father, mother, sister, sister, and son.    ROS:  Please see the history of present illness.   Otherwise, review of systems is positive for ***.   All other systems are reviewed and negative.    PHYSICAL EXAM: VS:  LMP 05/19/2016  , BMI There is no height or weight on file to calculate BMI. GEN: Well nourished, well developed, in no acute distress  HEENT: normal  Neck: no JVD, carotid bruits, or masses Cardiac: ***RRR; no murmurs, rubs, or gallops,no edema  Respiratory:  clear to auscultation bilaterally, normal work of breathing GI: soft, nontender, nondistended, + BS MS: no deformity  or atrophy  Skin: warm and dry Neuro:  Strength and sensation are intact Psych: euthymic mood, full affect  EKG:  EKG {ACTION; IS/IS IFO:[id number]} ordered today. Personal review of the ekg ordered shows ***  Recent Labs: 06/04/2018: ALT 10; TSH 0.694 06/07/2018: Magnesium 2.9 06/20/2018: BUN 7; Creatinine, Ser 0.93; Hemoglobin 7.7; Platelets 433; Potassium 3.2; Sodium 145    Lipid Panel     Component Value Date/Time   CHOL 141 02/09/2018 0842   TRIG 110 02/09/2018 0842   HDL 47 (L) 02/09/2018 0842   CHOLHDL 3.0 02/09/2018 0842   VLDL 17 06/24/2017 2147   LDLCALC 74 02/09/2018 0842   LDLDIRECT 122 (H) 01/08/2010 0818     Wt Readings from Last 3 Encounters:  06/14/18 165 lb 12.6 oz (75.2 kg)  05/26/18 152 lb 1.9 oz (69 kg)  05/05/18 164 lb (74.4 kg)      Other studies Reviewed: Additional studies/ records that were reviewed today include: TTE 06/22/17  Review of the above records today demonstrates:  - Left ventricle: The cavity size was normal. Wall thickness was   increased in a pattern of moderate LVH. Systolic function was   moderately to severely reduced. The estimated ejection fraction   was in the range of 30% to 35%. Diffuse hypokinesis.  Features are   consistent with a pseudonormal left ventricular filling pattern,   with concomitant abnormal relaxation and increased filling   pressure (grade 2 diastolic dysfunction). - Mitral valve: There was moderate regurgitation directed   posteriorly. - Left atrium: The atrium was mildly dilated.   ASSESSMENT AND PLAN:  1.  Rapid atrial fibrillation/atrial tachycardia: Currently on amiodarone and Eliquis.***  This patients CHA2DS2-VASc Score and unadjusted Ischemic Stroke Rate (% per year) is equal to 9.7 % stroke rate/year from a score of 6  Above score calculated as 1 point each if present [CHF, HTN, DM, Vascular=MI/PAD/Aortic Plaque, Age if 65-74, or Female] Above score calculated as 2 points each if present [Age > 75, or Stroke/TIA/TE]   2.  Nonischemic cardiomyopathy: Currently on optimal medical therapy.***  3.  Hypertension:***  4.  History of CVA: Currently on Eliquis for atrial fibrillation.    Current medicines are reviewed at length with the patient today.   The patient {ACTIONS; HAS/DOES NOT HAVE:19233} concerns regarding her medicines.  The following changes were made today:  {NONE DEFAULTED:18576::"none"}  Labs/ tests ordered today include: *** No orders of the defined types were placed in this encounter.    Disposition:   FU with Carely Nappier {gen number [id number]} {Days to years:10300}  Signed, Barbara Poth Meredith Leeds, MD  06/28/2018 8:29 AM     [facility] [address] [place] [address] [phone] (office) [phone] (fax)'

## 2018-06-29 ENCOUNTER — Inpatient Hospital Stay: Admission: RE | Admit: 2018-06-29 | Payer: Self-pay | Source: Ambulatory Visit

## 2018-06-29 ENCOUNTER — Encounter: Payer: Self-pay | Admitting: Internal Medicine

## 2018-06-29 ENCOUNTER — Other Ambulatory Visit: Payer: Self-pay

## 2018-06-29 NOTE — Telephone Encounter (Signed)
States that only medicaid can get aids

## 2018-06-30 DIAGNOSIS — I130 Hypertensive heart and chronic kidney disease with heart failure and stage 1 through stage 4 chronic kidney disease, or unspecified chronic kidney disease: Secondary | ICD-10-CM | POA: Diagnosis not present

## 2018-06-30 DIAGNOSIS — I4891 Unspecified atrial fibrillation: Secondary | ICD-10-CM | POA: Diagnosis not present

## 2018-06-30 DIAGNOSIS — I5042 Chronic combined systolic (congestive) and diastolic (congestive) heart failure: Secondary | ICD-10-CM | POA: Diagnosis not present

## 2018-06-30 DIAGNOSIS — K650 Generalized (acute) peritonitis: Secondary | ICD-10-CM | POA: Diagnosis not present

## 2018-07-05 ENCOUNTER — Ambulatory Visit
Admission: RE | Admit: 2018-07-05 | Discharge: 2018-07-05 | Disposition: A | Discharge status: Home / Self Care | Payer: Medicare Other | Source: Ambulatory Visit | Attending: General Surgery | Admitting: General Surgery

## 2018-07-05 ENCOUNTER — Ambulatory Visit
Admission: RE | Admit: 2018-07-05 | Discharge: 2018-07-05 | Disposition: A | Discharge status: Home / Self Care | Payer: Medicare Other | Source: Ambulatory Visit | Attending: Student | Admitting: Student

## 2018-07-05 ENCOUNTER — Encounter: Payer: Self-pay | Admitting: *Deleted

## 2018-07-05 DIAGNOSIS — K651 Peritoneal abscess: Secondary | ICD-10-CM

## 2018-07-05 DIAGNOSIS — K551 Chronic vascular disorders of intestine: Secondary | ICD-10-CM | POA: Diagnosis not present

## 2018-07-05 HISTORY — PX: IR RADIOLOGIST EVAL & MGMT: IMG5224

## 2018-07-05 MED ORDER — IOPAMIDOL (ISOVUE-300) INJECTION 61%
100.0000 mL | Freq: Once | INTRAVENOUS | Status: AC | PRN
  Administered 2018-07-05: 100 mL via INTRAVENOUS

## 2018-07-05 NOTE — Progress Notes (Signed)
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 0.93  GFRNONAA 57* >60 >60 >60  GFRAA >60 >60 >60 >60    LIVER FUNCTION TESTS: Recent Labs    08/03/17 0450 09/14/17 1750 02/09/18 0842 06/04/18 1428 06/06/18 0429 06/08/18 0408 06/10/18 0624  BILITOT 0.8 0.6 0.3 1.0  --   --   --   AST 18 20 26  14*  --   --   --   ALT 17 17 23 10   --   --   --   ALKPHOS 48 42  --  87  --   --   --   PROT 6.3* 6.9 6.4 8.1  --   --   --   ALBUMIN 2.3* 2.7*  --  2.4* 1.8* 1.7* 1.8*    TUMOR MARKERS: No results for input(s): AFPTM, CEA, CA199, CHROMGRNA in the last 8760 hours.  Assessment and Plan:  My impression is that the patient's subphrenic right collection has largely resolved after percutaneous drain catheter placement.  The catheter has partially withdrawn and is no longer in a  functional position.  The residual loculated component is medial to the drain catheter.  We will plan to go ahead and take out the catheter at this point.  As the other untreated collection in the lower abdomen  has demonstrated significant improvement without  percutaneous intervention, I suspect this small residual right upper quadrant perihepatic collection may continue to resolve on its own as well.   Findings were reviewed with the patient.  The percutaneous change drain catheter was removed uneventfully.  Site covered with sterile gauze dressing. Thank you for this interesting consult.  I greatly enjoyed meeting [employer] and look forward to participating in their care.  A copy of this report was sent to the requesting provider on this date.  Electronically Signed: Rickard Rhymes 07/05/2018, 3:48 PM   I spent a total of    25 Minutes in face to face in clinical consultation, greater than 50% of which was counseling/coordinating care for right upper quadrant abscess, post percutaneous drainage.

## 2018-07-06 DIAGNOSIS — L98499 Non-pressure chronic ulcer of skin of other sites with unspecified severity: Secondary | ICD-10-CM | POA: Diagnosis not present

## 2018-07-07 DIAGNOSIS — E875 Hyperkalemia: Secondary | ICD-10-CM | POA: Diagnosis not present

## 2018-07-11 ENCOUNTER — Encounter (HOSPITAL_COMMUNITY): Payer: Self-pay | Admitting: Emergency Medicine

## 2018-07-11 ENCOUNTER — Inpatient Hospital Stay (HOSPITAL_COMMUNITY): Payer: Medicare Other

## 2018-07-11 ENCOUNTER — Inpatient Hospital Stay (HOSPITAL_COMMUNITY)
Admission: EM | Admit: 2018-07-11 | Discharge: 2018-07-14 | DRG: 683 | Disposition: A | Discharge status: Home Health | Payer: Medicare Other | Attending: Internal Medicine | Admitting: Internal Medicine

## 2018-07-11 DIAGNOSIS — I5042 Chronic combined systolic (congestive) and diastolic (congestive) heart failure: Secondary | ICD-10-CM | POA: Diagnosis present

## 2018-07-11 DIAGNOSIS — E876 Hypokalemia: Secondary | ICD-10-CM | POA: Diagnosis not present

## 2018-07-11 DIAGNOSIS — E785 Hyperlipidemia, unspecified: Secondary | ICD-10-CM | POA: Diagnosis not present

## 2018-07-11 DIAGNOSIS — N179 Acute kidney failure, unspecified: Principal | ICD-10-CM | POA: Diagnosis present

## 2018-07-11 DIAGNOSIS — I480 Paroxysmal atrial fibrillation: Secondary | ICD-10-CM | POA: Diagnosis present

## 2018-07-11 DIAGNOSIS — N1330 Unspecified hydronephrosis: Secondary | ICD-10-CM | POA: Diagnosis not present

## 2018-07-11 DIAGNOSIS — Z743 Need for continuous supervision: Secondary | ICD-10-CM | POA: Diagnosis not present

## 2018-07-11 DIAGNOSIS — I10 Essential (primary) hypertension: Secondary | ICD-10-CM | POA: Diagnosis present

## 2018-07-11 DIAGNOSIS — D62 Acute posthemorrhagic anemia: Secondary | ICD-10-CM | POA: Diagnosis not present

## 2018-07-11 DIAGNOSIS — N132 Hydronephrosis with renal and ureteral calculous obstruction: Secondary | ICD-10-CM | POA: Diagnosis not present

## 2018-07-11 DIAGNOSIS — Z79899 Other long term (current) drug therapy: Secondary | ICD-10-CM

## 2018-07-11 DIAGNOSIS — D72829 Elevated white blood cell count, unspecified: Secondary | ICD-10-CM | POA: Diagnosis present

## 2018-07-11 DIAGNOSIS — Z794 Long term (current) use of insulin: Secondary | ICD-10-CM | POA: Diagnosis not present

## 2018-07-11 DIAGNOSIS — I482 Chronic atrial fibrillation: Secondary | ICD-10-CM | POA: Diagnosis not present

## 2018-07-11 DIAGNOSIS — F1410 Cocaine abuse, uncomplicated: Secondary | ICD-10-CM | POA: Diagnosis present

## 2018-07-11 DIAGNOSIS — Z7901 Long term (current) use of anticoagulants: Secondary | ICD-10-CM

## 2018-07-11 DIAGNOSIS — I959 Hypotension, unspecified: Secondary | ICD-10-CM | POA: Diagnosis not present

## 2018-07-11 DIAGNOSIS — F333 Major depressive disorder, recurrent, severe with psychotic symptoms: Secondary | ICD-10-CM | POA: Diagnosis present

## 2018-07-11 DIAGNOSIS — D649 Anemia, unspecified: Secondary | ICD-10-CM | POA: Diagnosis not present

## 2018-07-11 DIAGNOSIS — N183 Chronic kidney disease, stage 3 unspecified: Secondary | ICD-10-CM | POA: Diagnosis present

## 2018-07-11 DIAGNOSIS — Z8673 Personal history of transient ischemic attack (TIA), and cerebral infarction without residual deficits: Secondary | ICD-10-CM

## 2018-07-11 DIAGNOSIS — E1140 Type 2 diabetes mellitus with diabetic neuropathy, unspecified: Secondary | ICD-10-CM | POA: Diagnosis present

## 2018-07-11 DIAGNOSIS — N1832 Chronic kidney disease, stage 3b: Secondary | ICD-10-CM | POA: Diagnosis present

## 2018-07-11 DIAGNOSIS — Z87891 Personal history of nicotine dependence: Secondary | ICD-10-CM | POA: Diagnosis not present

## 2018-07-11 DIAGNOSIS — E1122 Type 2 diabetes mellitus with diabetic chronic kidney disease: Secondary | ICD-10-CM | POA: Diagnosis not present

## 2018-07-11 DIAGNOSIS — Z7401 Bed confinement status: Secondary | ICD-10-CM | POA: Diagnosis not present

## 2018-07-11 DIAGNOSIS — J449 Chronic obstructive pulmonary disease, unspecified: Secondary | ICD-10-CM | POA: Diagnosis not present

## 2018-07-11 DIAGNOSIS — F332 Major depressive disorder, recurrent severe without psychotic features: Secondary | ICD-10-CM | POA: Diagnosis present

## 2018-07-11 DIAGNOSIS — D500 Iron deficiency anemia secondary to blood loss (chronic): Secondary | ICD-10-CM | POA: Diagnosis not present

## 2018-07-11 DIAGNOSIS — I130 Hypertensive heart and chronic kidney disease with heart failure and stage 1 through stage 4 chronic kidney disease, or unspecified chronic kidney disease: Secondary | ICD-10-CM | POA: Diagnosis not present

## 2018-07-11 DIAGNOSIS — I509 Heart failure, unspecified: Secondary | ICD-10-CM | POA: Diagnosis not present

## 2018-07-11 DIAGNOSIS — I4891 Unspecified atrial fibrillation: Secondary | ICD-10-CM | POA: Diagnosis present

## 2018-07-11 DIAGNOSIS — F1910 Other psychoactive substance abuse, uncomplicated: Secondary | ICD-10-CM | POA: Diagnosis present

## 2018-07-11 DIAGNOSIS — R338 Other retention of urine: Secondary | ICD-10-CM | POA: Diagnosis not present

## 2018-07-11 DIAGNOSIS — Z7982 Long term (current) use of aspirin: Secondary | ICD-10-CM

## 2018-07-11 DIAGNOSIS — R310 Gross hematuria: Secondary | ICD-10-CM | POA: Diagnosis not present

## 2018-07-11 DIAGNOSIS — R5381 Other malaise: Secondary | ICD-10-CM | POA: Diagnosis not present

## 2018-07-11 LAB — PROTIME-INR
INR: 1.61
Prothrombin Time: 19 seconds — ABNORMAL HIGH (ref 11.4–15.2)

## 2018-07-11 LAB — GLUCOSE, CAPILLARY: Glucose-Capillary: 87 mg/dL (ref 70–99)

## 2018-07-11 LAB — COMPREHENSIVE METABOLIC PANEL
ALT: 7 U/L (ref 0–44)
AST: 10 U/L — ABNORMAL LOW (ref 15–41)
Albumin: 2.4 g/dL — ABNORMAL LOW (ref 3.5–5.0)
Alkaline Phosphatase: 72 U/L (ref 38–126)
Anion gap: 5 (ref 5–15)
BUN: 39 mg/dL — ABNORMAL HIGH (ref 6–20)
CO2: 28 mmol/L (ref 22–32)
Calcium: 8.2 mg/dL — ABNORMAL LOW (ref 8.9–10.3)
Chloride: 105 mmol/L (ref 98–111)
Creatinine, Ser: 2.39 mg/dL — ABNORMAL HIGH (ref 0.44–1.00)
GFR calc Af Amer: 25 mL/min — ABNORMAL LOW (ref >60)
GFR calc non Af Amer: 22 mL/min — ABNORMAL LOW (ref >60)
Glucose, Bld: 193 mg/dL — ABNORMAL HIGH (ref 70–99)
Potassium: 3.2 mmol/L — ABNORMAL LOW (ref 3.5–5.1)
Sodium: 138 mmol/L (ref 135–145)
Total Bilirubin: 0.5 mg/dL (ref 0.3–1.2)
Total Protein: 7.4 g/dL (ref 6.5–8.1)

## 2018-07-11 LAB — RETICULOCYTES
RBC.: 2.26 MIL/uL — ABNORMAL LOW (ref 3.87–5.11)
Retic Count, Absolute: 137.9 10*3/uL (ref 19.0–186.0)
Retic Ct Pct: 6.1 % — ABNORMAL HIGH (ref 0.4–3.1)

## 2018-07-11 LAB — URINALYSIS, ROUTINE W REFLEX MICROSCOPIC
Bilirubin Urine: NEGATIVE
Glucose, UA: 50 mg/dL — AB
Ketones, ur: NEGATIVE mg/dL
Nitrite: NEGATIVE
Protein, ur: 100 mg/dL — AB
RBC / HPF: >50 RBC/hpf — ABNORMAL HIGH (ref 0–5)
Specific Gravity, Urine: 1.013 (ref 1.005–1.030)
pH: 6 (ref 5.0–8.0)

## 2018-07-11 LAB — LIPASE, BLOOD: Lipase: 21 U/L (ref 11–51)

## 2018-07-11 LAB — CBC WITH DIFFERENTIAL/PLATELET
Basophils Absolute: 0.1 10*3/uL (ref 0.0–0.1)
Basophils Relative: 1 %
Eosinophils Absolute: 0.3 10*3/uL (ref 0.0–0.7)
Eosinophils Relative: 3 %
HCT: 21.7 % — ABNORMAL LOW (ref 36.0–46.0)
Hemoglobin: 6.2 g/dL — CL (ref 12.0–15.0)
Lymphocytes Relative: 17 %
Lymphs Abs: 2.1 10*3/uL (ref 0.7–4.0)
MCH: 27.4 pg (ref 26.0–34.0)
MCHC: 28.6 g/dL — ABNORMAL LOW (ref 30.0–36.0)
MCV: 96 fL (ref 78.0–100.0)
Monocytes Absolute: 0.9 10*3/uL (ref 0.1–1.0)
Monocytes Relative: 7 %
Neutro Abs: 9.1 10*3/uL — ABNORMAL HIGH (ref 1.7–7.7)
Neutrophils Relative %: 72 %
Platelets: 397 10*3/uL (ref 150–400)
RBC: 2.26 MIL/uL — ABNORMAL LOW (ref 3.87–5.11)
RDW: 18.9 % — ABNORMAL HIGH (ref 11.5–15.5)
WBC: 12.5 10*3/uL — ABNORMAL HIGH (ref 4.0–10.5)

## 2018-07-11 LAB — IRON AND TIBC
Iron: 31 ug/dL (ref 28–170)
Saturation Ratios: 12 % (ref 10.4–31.8)
TIBC: 269 ug/dL (ref 250–450)
UIBC: 238 ug/dL

## 2018-07-11 LAB — FOLATE: Folate: 20.7 ng/mL (ref >5.9)

## 2018-07-11 LAB — VITAMIN B12: Vitamin B-12: 526 pg/mL (ref 180–914)

## 2018-07-11 LAB — FERRITIN: Ferritin: 225 ng/mL (ref 11–307)

## 2018-07-11 LAB — PREPARE RBC (CROSSMATCH)

## 2018-07-11 LAB — MAGNESIUM: Magnesium: 1.7 mg/dL (ref 1.7–2.4)

## 2018-07-11 LAB — POC OCCULT BLOOD, ED: Fecal Occult Bld: NEGATIVE

## 2018-07-11 MED ORDER — TRAZODONE HCL 50 MG PO TABS
50.0000 mg | ORAL_TABLET | Freq: Every day | ORAL | Status: DC
  Administered 2018-07-11 – 2018-07-13 (×3): 50 mg via ORAL
  Filled 2018-07-11 (×3): qty 1

## 2018-07-11 MED ORDER — TIOTROPIUM BROMIDE MONOHYDRATE 18 MCG IN CAPS
18.0000 ug | ORAL_CAPSULE | Freq: Every day | RESPIRATORY_TRACT | Status: DC
  Administered 2018-07-12 – 2018-07-13 (×2): 18 ug via RESPIRATORY_TRACT
  Filled 2018-07-11: qty 5

## 2018-07-11 MED ORDER — SODIUM CHLORIDE 0.9 % IV BOLUS: 500.0000 mL | Freq: Once | INTRAVENOUS | Status: DC

## 2018-07-11 MED ORDER — LINAGLIPTIN 5 MG PO TABS
5.0000 mg | ORAL_TABLET | Freq: Every day | ORAL | Status: DC
  Administered 2018-07-12: 5 mg via ORAL
  Filled 2018-07-11: qty 1

## 2018-07-11 MED ORDER — IPRATROPIUM-ALBUTEROL 0.5-2.5 (3) MG/3ML IN SOLN: 3.0000 mL | Freq: Four times a day (QID) | RESPIRATORY_TRACT | Status: DC | PRN

## 2018-07-11 MED ORDER — POTASSIUM CHLORIDE IN NACL 20-0.9 MEQ/L-% IV SOLN
INTRAVENOUS | Status: AC
  Administered 2018-07-11: 21:00:00 via INTRAVENOUS

## 2018-07-11 MED ORDER — SODIUM CHLORIDE 0.9 % IV BOLUS
250.0000 mL | Freq: Once | INTRAVENOUS | Status: AC
  Administered 2018-07-11: 250 mL via INTRAVENOUS

## 2018-07-11 MED ORDER — MONTELUKAST SODIUM 10 MG PO TABS
10.0000 mg | ORAL_TABLET | Freq: Every day | ORAL | Status: DC
  Administered 2018-07-11 – 2018-07-13 (×3): 10 mg via ORAL
  Filled 2018-07-11 (×3): qty 1

## 2018-07-11 MED ORDER — SODIUM CHLORIDE 0.9 % IV SOLN
10.0000 mL/h | Freq: Once | INTRAVENOUS | Status: AC
  Administered 2018-07-11: 10 mL/h via INTRAVENOUS

## 2018-07-11 MED ORDER — FLUOXETINE HCL 20 MG PO CAPS
20.0000 mg | ORAL_CAPSULE | Freq: Every day | ORAL | Status: DC
  Administered 2018-07-12 – 2018-07-13 (×2): 20 mg via ORAL
  Filled 2018-07-11 (×3): qty 1

## 2018-07-11 MED ORDER — INSULIN ASPART 100 UNIT/ML ~~LOC~~ SOLN
0.0000 [IU] | Freq: Three times a day (TID) | SUBCUTANEOUS | Status: DC
  Administered 2018-07-12: 2 [IU] via SUBCUTANEOUS
  Administered 2018-07-13: 1 [IU] via SUBCUTANEOUS
  Administered 2018-07-13: 2 [IU] via SUBCUTANEOUS
  Administered 2018-07-14: 1 [IU] via SUBCUTANEOUS

## 2018-07-11 MED ORDER — POTASSIUM CHLORIDE CRYS ER 20 MEQ PO TBCR
30.0000 meq | EXTENDED_RELEASE_TABLET | Freq: Every day | ORAL | Status: DC
  Administered 2018-07-12 – 2018-07-13 (×2): 30 meq via ORAL
  Filled 2018-07-11 (×3): qty 1

## 2018-07-11 MED ORDER — EZETIMIBE 10 MG PO TABS
10.0000 mg | ORAL_TABLET | Freq: Every day | ORAL | Status: DC
  Administered 2018-07-12 – 2018-07-13 (×2): 10 mg via ORAL
  Filled 2018-07-11 (×3): qty 1

## 2018-07-11 MED ORDER — ONDANSETRON HCL 4 MG PO TABS: 4.0000 mg | ORAL_TABLET | Freq: Four times a day (QID) | ORAL | Status: DC | PRN

## 2018-07-11 MED ORDER — METOPROLOL SUCCINATE ER 50 MG PO TB24
50.0000 mg | ORAL_TABLET | Freq: Two times a day (BID) | ORAL | Status: DC
  Administered 2018-07-11 – 2018-07-13 (×5): 50 mg via ORAL
  Filled 2018-07-11 (×6): qty 1

## 2018-07-11 MED ORDER — POTASSIUM CHLORIDE CRYS ER 20 MEQ PO TBCR
40.0000 meq | EXTENDED_RELEASE_TABLET | Freq: Once | ORAL | Status: AC
  Administered 2018-07-11: 40 meq via ORAL
  Filled 2018-07-11: qty 2

## 2018-07-11 MED ORDER — AMIODARONE HCL 200 MG PO TABS
200.0000 mg | ORAL_TABLET | Freq: Two times a day (BID) | ORAL | Status: DC
  Administered 2018-07-11 – 2018-07-13 (×5): 200 mg via ORAL
  Filled 2018-07-11 (×6): qty 1

## 2018-07-11 MED ORDER — ONDANSETRON HCL 4 MG/2ML IJ SOLN: 4.0000 mg | Freq: Four times a day (QID) | INTRAMUSCULAR | Status: DC | PRN

## 2018-07-11 MED ORDER — ROSUVASTATIN CALCIUM 10 MG PO TABS
10.0000 mg | ORAL_TABLET | Freq: Every day | ORAL | Status: DC
  Administered 2018-07-12 – 2018-07-13 (×2): 10 mg via ORAL
  Filled 2018-07-11 (×5): qty 1

## 2018-07-11 MED ORDER — PANTOPRAZOLE SODIUM 40 MG PO TBEC
40.0000 mg | DELAYED_RELEASE_TABLET | Freq: Every day | ORAL | Status: DC
  Administered 2018-07-12 – 2018-07-13 (×2): 40 mg via ORAL
  Filled 2018-07-11 (×3): qty 1

## 2018-07-11 NOTE — ED Triage Notes (Signed)
Pt sent from [facility] for evaluation of rectal bleeding and low hgb.

## 2018-07-11 NOTE — H&P (Addendum)
History and Physical    [name] GQQ:[id number] DOB: 1966/10/02 DOA: 07/11/2018  PCP: [name], MD   Patient coming from: [facility] home  Chief Complaint: Low Hemoglobin, diarrhea  HPI: [name] is a 51 y.o. female with medical history significant for CKD, 3, DM2, CVA, polysubstance abuse alcohol and cocaine, systolic and diastolic CHF, COPD, atrial fibrillation.  Patient reports seen blood in her diapers multiple times since hospital discharge, she is unsure where it is coming from.  At the nursing home her hemoglobin was being monitored closely, dropped gradually to 6.2 today, she was subsequently sent to the ED. Patient reports multiple episodes of watery stools since discharge, multiple times a day.  She denies abdominal pain.  No nausea no vomiting.  Endorses okay p.o. Intake.  No shortness of breath no chest pain  Recent prolonged hospital admission 8/10- 06/20/18, for sepsis secondary to E. coli UTI, also pelvic and perihepatic abscesses requiring IR drain placements 8/21.  Patient discharged to complete a course of Augmentin.  Followed up with IR 07/05/18, catheter will was in a nonfunctional position, but was sticking out, as collection had largely resolved on repeat imaging- ct ABD/PELVIS w contrast, the pelvic abscess which was untreated demonstrated significant improvement with her percutaneous intervention.  Also had gross hematuria with drop in hemoglobin, urology suggested this was secondary to severe cystitis and to discharge patient on Foley.   ED Course: Stable vitals.  Hemoglobin 6.2.  Creatinine elevated 2.39.  K low 3.2.  Mild leukocytosis 12.5.  EKG-no significant change from prior.  UA-large hemoglobin, trace leuks rare bacteria.  Stool FOBT negative.  1 units PRBC ordered, 250 bolus normal saline given.  Hospitalist called to admit for anemia and AKI.  Review of Systems: As per HPI all other systems reviewed and negative.  Past Medical History:   Diagnosis Date  . Alcohol use   . Ankle fracture, lateral malleolus, closed 12/30/2011  . Anxiety   . Breast mass, left 12/15/2011  . Chronic anemia   . Chronic combined systolic and diastolic CHF (congestive heart failure) ([place])   . CKD (chronic kidney disease), stage III ([place])    pt denies  . Cocaine abuse ([place])   . COPD (chronic obstructive pulmonary disease) ([place])   . Diabetes mellitus, type 2 ([place])   . Diabetic Charcot foot ([place]) 12/30/2011  . Essential hypertension   . History of GI bleed 12/05/2009   Qualifier: Diagnosis of  By: Zeb Comfort    . Hyperlipidemia   . Hypertensive cardiomyopathy ([place]) 11/08/2012   a. 05/2016: echo showing EF of 20-25% with normal cors by cath in 07/2016.  Marland Kitchen Noncompliance with medication regimen   . Obesity   . Panic attacks   . PAT (paroxysmal atrial tachycardia) ([place])    a. started on amiodarone 07/2017 for this.  . Sleep apnea    does not use cpap  . Stroke ([place])    06/24/17  . Tobacco abuse   . Urinary incontinence     Past Surgical History:  Procedure Laterality Date  . BREAST BIOPSY    . CARDIAC CATHETERIZATION N/A 07/28/2016   Procedure: Left Heart Cath and Coronary Angiography;  Surgeon: Jettie Booze, MD;  Location: [facility];  Service: Cardiovascular;  Laterality: N/A;  . DILATION AND CURETTAGE OF UTERUS    . I&D EXTREMITY Bilateral 09/22/2017   Procedure: BILATERAL DEBRIDEMENT LEG/FOOT ULCERS, APPLY VERAFLO WOUND VAC;  Surgeon: Newt Minion, MD;  Location:  [facility];  Service: Orthopedics;  Laterality: Bilateral;  . IR RADIOLOGIST EVAL & MGMT  07/05/2018  . SKIN SPLIT GRAFT Bilateral 09/28/2017   Procedure: BILATERAL SPLIT THICKNESS SKIN GRAFT LEGS/FEET AND APPLY VAC;  Surgeon: Newt Minion, MD;  Location: [facility];  Service: Orthopedics;  Laterality: Bilateral;     reports that she quit smoking about 13 months ago. Her smoking use included cigarettes. She has a 20.00 pack-year smoking history. She has never used  smokeless tobacco. She reports that she has current or past drug history. Drugs: Marijuana and Cocaine. Frequency: 3.00 times per week. She reports that she does not drink alcohol.  No Known Allergies  Family History  Problem Relation Age of Onset  . Hypertension Mother   . Heart attack Mother   . Hypertension Father        CABG   . Hypertension Sister   . Hypertension Brother   . Hypertension Sister   . Cancer Sister        breast   . Arthritis Unknown   . Cancer Unknown   . Diabetes Unknown   . Asthma Unknown   . Hypertension Daughter   . Hypertension Son     Prior to Admission medications   Medication Sig Start Date End Date Taking? Authorizing Provider  albuterol (PROAIR HFA) 108 (90 Base) MCG/ACT inhaler INHALE 2 PUFFS EVERY 6 HOURS AS NEEDED FOR SHORTNESS OF BREATH/WHEEZING. Patient taking differently: Inhale 2 puffs into the lungs every 6 (six) hours as needed for wheezing or shortness of breath.  02/09/18  Yes [name], MD  amiodarone (PACERONE) 200 MG tablet Take 1 tablet (200 mg total) by mouth 2 (two) times daily. 08/14/17  Yes Oswald Hillock, MD  amLODipine (NORVASC) 2.5 MG tablet Take 1 tablet (2.5 mg total) by mouth daily. 05/26/18  Yes [name], MD  apixaban (ELIQUIS) 5 MG TABS tablet Take 1 tablet (5 mg total) by mouth 2 (two) times daily. 06/17/18  Yes Barton Dubois, MD  aspirin EC 81 MG tablet Take 1 tablet (81 mg total) by mouth daily. 06/17/18 06/17/19 Yes Barton Dubois, MD  ezetimibe (ZETIA) 10 MG tablet TAKE 1 TABLET BY MOUTH ONCE A DAY. Patient taking differently: Take 10 mg by mouth daily.  05/27/18  Yes [name], MD  FLUoxetine (PROZAC) 20 MG capsule TAKE 1 CAPSULE BY MOUTH EVERY DAY. Patient taking differently: Take 20 mg by mouth daily. TAKE 1 CAPSULE BY MOUTH EVERY DAY. 05/27/18  Yes [name], MD  furosemide (LASIX) 20 MG tablet TAKE 1 TABLET BY MOUTH ONCE A DAY. Patient taking differently: Take 20 mg by mouth daily.   05/27/18  Yes [name], MD  gabapentin (NEURONTIN) 300 MG capsule Take 1 capsule (300 mg total) by mouth 3 (three) times daily. 3 times a day when necessary neuropathy pain Patient taking differently: Take 300 mg by mouth every 8 (eight) hours.  05/30/18  Yes Newt Minion, MD  insulin detemir (LEVEMIR) 100 UNIT/ML injection Inject 0.08 mLs (8 Units total) into the skin at bedtime. Patient taking differently: Inject 8 Units into the skin daily.  01/14/18  Yes [name], MD  ipratropium-albuterol (DUONEB) 0.5-2.5 (3) MG/3ML SOLN Take 3 mLs by nebulization every 6 (six) hours as needed (wheezes or SOB).    Yes [provider]  linagliptin (TRADJENTA) 5 MG TABS tablet Take 1 tablet (5 mg total) by mouth daily. 02/17/18  Yes [name], MD  metoprolol succinate (TOPROL-XL) 50 MG 24 hr tablet Take 1 tablet (50 mg total) by mouth 2 (two) times daily. Take with or immediately following a meal. 06/17/18  Yes Barton Dubois, MD  montelukast (SINGULAIR) 10 MG tablet TAKE (1) TABLET BY MOUTH AT BEDTIME. Patient taking differently: Take 10 mg by mouth at bedtime. TAKE (1) TABLET BY MOUTH AT BEDTIME. 05/27/18  Yes [name], MD  pantoprazole (PROTONIX) 40 MG tablet Take 1 tablet (40 mg total) by mouth daily. 02/09/18  Yes [name], MD  potassium chloride SA (K-DUR,KLOR-CON) 20 MEQ tablet TAKE 1&1/2 TABLET BY MOUTH DAILY. Patient taking differently: Take 30 mEq by mouth daily.  05/27/18  Yes [name], MD  rosuvastatin (CRESTOR) 10 MG tablet TAKE 1 TABLET BY MOUTH ONCE A DAY. Patient taking differently: Take 10 mg by mouth every evening.  05/30/18  Yes [name], MD  tiotropium (SPIRIVA HANDIHALER) 18 MCG inhalation capsule Place 18 mcg into inhaler and inhale daily. 2 puffs   Yes [provider]  traMADol (ULTRAM) 50 MG tablet Take 1 tablet (50 mg total) by mouth every 6 (six) hours as needed for severe pain. 06/17/18  Yes Barton Dubois,  MD  traZODone (DESYREL) 50 MG tablet TAKE (1) TABLET BY MOUTH AT BEDTIME. Patient taking differently: Take 50 mg by mouth at bedtime.  02/09/18  Yes [name], MD    Physical Exam: Vitals:   07/11/18 1530 07/11/18 1600 07/11/18 1630 07/11/18 1715  BP:  117/73 108/70 111/81  Pulse:  79 80 80  Resp: 18 20 19  (!) 22  SpO2:  94% 95% 97%  Weight:      Height:        Constitutional: NAD, calm, comfortable Vitals:   07/11/18 1530 07/11/18 1600 07/11/18 1630 07/11/18 1715  BP:  117/73 108/70 111/81  Pulse:  79 80 80  Resp: 18 20 19  (!) 22  SpO2:  94% 95% 97%  Weight:      Height:       Eyes: PERRL, lids and conjunctivae normal ENMT: Mucous membranes are moist. Posterior pharynx clear of any exudate or lesions. Neck: normal, supple, no masses, no thyromegaly Respiratory: clear to auscultation bilaterally, no wheezing.  Crackles- right lung base. Normal respiratory effort. No accessory muscle use.  Cardiovascular: Regular rate and rhythm, no murmurs / rubs / gallops. Trace bilat pitting extremity edema. 2+ pedal pulses.  Abdomen: Full, mild diffuse tenderness, no masses palpated. No hepatosplenomegaly. Bowel sounds positive.  Musculoskeletal: no clubbing / cyanosis. No joint deformity upper and lower extremities. Good ROM, no contractures. Normal muscle tone.  Skin: no rashes, lesions, ulcers. No induration Neurologic: CN 2-12 grossly intact. Strength 5/5 in all 4.  Psychiatric: Normal judgment and insight. Alert and oriented x 3. Normal mood.   Labs on Admission: I have personally reviewed following labs and imaging studies  CBC: Recent Labs  Lab 07/11/18 1550  WBC 12.5*  NEUTROABS 9.1*  HGB 6.2*  HCT 21.7*  MCV 96.0  PLT 542   Basic Metabolic Panel: Recent Labs  Lab 07/11/18 1550  NA 138  K 3.2*  CL 105  CO2 28  GLUCOSE 193*  BUN 39*  CREATININE 2.39*  CALCIUM 8.2*   GFR: Estimated Creatinine Clearance: 25.2 mL/min (A) (by C-G formula based on SCr of  2.39 mg/dL (H)). Liver Function Tests: Recent Labs  Lab 07/11/18 1550  AST 10*  ALT 7  ALKPHOS 72  BILITOT 0.5  PROT 7.4  ALBUMIN 2.4*   Recent Labs  Lab 07/11/18 1550  LIPASE 21   Coagulation Profile: Recent Labs  Lab 07/11/18 1550  INR 1.61   Anemia Panel: Recent Labs    07/11/18 1550  VITAMINB12 526  FOLATE 20.7  FERRITIN 225  TIBC 269  IRON 31  RETICCTPCT 6.1*   Urine analysis:    Component Value Date/Time   COLORURINE AMBER (A) 07/11/2018 1609   APPEARANCEUR CLOUDY (A) 07/11/2018 1609   LABSPEC 1.013 07/11/2018 1609   PHURINE 6.0 07/11/2018 1609   GLUCOSEU 50 (A) 07/11/2018 1609   HGBUR LARGE (A) 07/11/2018 1609   BILIRUBINUR NEGATIVE 07/11/2018 1609   KETONESUR NEGATIVE 07/11/2018 1609   PROTEINUR 100 (A) 07/11/2018 1609   UROBILINOGEN 0.2 09/11/2014 1319   NITRITE NEGATIVE 07/11/2018 1609   LEUKOCYTESUR TRACE (A) 07/11/2018 1609    Radiological Exams on Admission: No results found.  EKG: Independently reviewed.  QTc 483.  Nonspecific T wave abnormalities inferior lateral leads.  Assessment/Plan Principal Problem:   AKI (acute kidney injury) ([place]) Active Problems:   HTN (hypertension), malignant   Diabetic neuropathy (HCC)   CKD (chronic kidney disease) stage 3, GFR 30-59 ml/min (HCC)   Severe recurrent major depression without psychotic features ([place])   Polysubstance abuse (including cocaine)   Chronic combined systolic and diastolic CHF (congestive heart failure) (HCC)   COPD (chronic obstructive pulmonary disease) (HCC)   Anemia   A-fib (HCC)  Acute on chronic anemia-hgb 6.2, 7.7 on discharge 06/20/18. Likely secondary to hematuria while on Eliquis.  Patient has not seen her period in at least a year.  FOBT negative.  Anemia panel shows normal ferritin, iron and TIBC.  Gross hematuria on recent hospitalization, thought 2/2 to severe cystitis at that time. - Transfuse 1 unit - Urology consult in a.m- order placed - Post Transfusion and  CBC in a.m. - NPO midnight pending urology eval.   Acute kidney injury on CKD3- Cr 2.39, recent baseline 0.7-1.  Likely prerenal with elevated BUN 39, 2/2 diarrhea 2 weeks, low-dose Lasix.  250 bolus given the ED. Weights today unchanged from recent discharge weights -Gentle hydration N/s +20kcl 100cc/hr x 12 hrs - BMP a.m  Diarrhea-with abdominal fullness and tenderness diffuse.  WBC 12.5.  Recent perihepatic and pelvic abscesses.  Recent prolonged antibiotic exposure, hospital admission and nursing home resident. -CT abdomen and pelvis without contrast -Stool For C. difficile GI pathogen panel  Hypokalemia- 3.2.  -Check Magnesium -Replete, BMP a.m.  Diastolic and systolic CHF-stable with AKI requiring hydration at this time.  05/2017 Echo 30 to 35% EF, G2DD. -Continue home metoprolol, ezetimebe -Hold aspirin  DM-glucose 193. Hgba1c- 01/2018- 7.2. -Continue home linagliptin, hold home Levemir - SSI  Atrial fibrillation-rate controlled, on chronic anticoagulation with Eliquis - Cont  home metoprolol and amiodarone -Hold Eliquis  COPD-stable -Cont home bronchodilators  DVT prophylaxis: Scds Code Status: Full Family Communication: None at bedside Disposition Plan: Per rounding team Consults called: None Admission status: Inpt, tele   Bethena Roys MD [facility] Pager [phone] From 6PM-2AM.  Otherwise please contact night-coverage [email or web address] Password [email or web address]  07/11/2018, 5:25 PM
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
## 2018-07-12 ENCOUNTER — Other Ambulatory Visit: Payer: Self-pay

## 2018-07-12 DIAGNOSIS — N1330 Unspecified hydronephrosis: Secondary | ICD-10-CM

## 2018-07-12 DIAGNOSIS — N179 Acute kidney failure, unspecified: Secondary | ICD-10-CM

## 2018-07-12 DIAGNOSIS — J449 Chronic obstructive pulmonary disease, unspecified: Secondary | ICD-10-CM

## 2018-07-12 DIAGNOSIS — I10 Essential (primary) hypertension: Secondary | ICD-10-CM

## 2018-07-12 DIAGNOSIS — D500 Iron deficiency anemia secondary to blood loss (chronic): Secondary | ICD-10-CM

## 2018-07-12 DIAGNOSIS — R338 Other retention of urine: Secondary | ICD-10-CM

## 2018-07-12 DIAGNOSIS — R310 Gross hematuria: Secondary | ICD-10-CM

## 2018-07-12 DIAGNOSIS — F1910 Other psychoactive substance abuse, uncomplicated: Secondary | ICD-10-CM

## 2018-07-12 DIAGNOSIS — I480 Paroxysmal atrial fibrillation: Secondary | ICD-10-CM

## 2018-07-12 LAB — MRSA PCR SCREENING: MRSA by PCR: NEGATIVE

## 2018-07-12 LAB — C DIFFICILE QUICK SCREEN W PCR REFLEX
C Diff antigen: NEGATIVE
C Diff interpretation: NOT DETECTED
C Diff toxin: NEGATIVE

## 2018-07-12 LAB — CBC
HCT: 26.9 % — ABNORMAL LOW (ref 36.0–46.0)
Hemoglobin: 8.4 g/dL — ABNORMAL LOW (ref 12.0–15.0)
MCH: 29.1 pg (ref 26.0–34.0)
MCHC: 31.2 g/dL (ref 30.0–36.0)
MCV: 93.1 fL (ref 78.0–100.0)
Platelets: 316 10*3/uL (ref 150–400)
RBC: 2.89 MIL/uL — ABNORMAL LOW (ref 3.87–5.11)
RDW: 16.6 % — ABNORMAL HIGH (ref 11.5–15.5)
WBC: 10.6 10*3/uL — ABNORMAL HIGH (ref 4.0–10.5)

## 2018-07-12 LAB — BASIC METABOLIC PANEL
Anion gap: 9 (ref 5–15)
BUN: 37 mg/dL — ABNORMAL HIGH (ref 6–20)
CO2: 23 mmol/L (ref 22–32)
Calcium: 8.4 mg/dL — ABNORMAL LOW (ref 8.9–10.3)
Chloride: 108 mmol/L (ref 98–111)
Creatinine, Ser: 2.2 mg/dL — ABNORMAL HIGH (ref 0.44–1.00)
GFR calc Af Amer: 28 mL/min — ABNORMAL LOW (ref >60)
GFR calc non Af Amer: 24 mL/min — ABNORMAL LOW (ref >60)
Glucose, Bld: 110 mg/dL — ABNORMAL HIGH (ref 70–99)
Potassium: 3.2 mmol/L — ABNORMAL LOW (ref 3.5–5.1)
Sodium: 140 mmol/L (ref 135–145)

## 2018-07-12 LAB — GLUCOSE, CAPILLARY
Glucose-Capillary: 105 mg/dL — ABNORMAL HIGH (ref 70–99)
Glucose-Capillary: 108 mg/dL — ABNORMAL HIGH (ref 70–99)
Glucose-Capillary: 183 mg/dL — ABNORMAL HIGH (ref 70–99)
Glucose-Capillary: 151 mg/dL — ABNORMAL HIGH (ref 70–99)

## 2018-07-12 MED ORDER — TRAMADOL HCL 50 MG PO TABS
50.0000 mg | ORAL_TABLET | Freq: Four times a day (QID) | ORAL | Status: DC | PRN
  Administered 2018-07-12 – 2018-07-13 (×3): 50 mg via ORAL
  Filled 2018-07-12 (×3): qty 1

## 2018-07-12 MED ORDER — METRONIDAZOLE IN NACL 5-0.79 MG/ML-% IV SOLN
500.0000 mg | Freq: Three times a day (TID) | INTRAVENOUS | Status: DC
  Administered 2018-07-12 – 2018-07-14 (×7): 500 mg via INTRAVENOUS
  Filled 2018-07-12 (×7): qty 100

## 2018-07-12 MED ORDER — CIPROFLOXACIN IN D5W 400 MG/200ML IV SOLN
400.0000 mg | INTRAVENOUS | Status: DC
  Administered 2018-07-12 – 2018-07-13 (×2): 400 mg via INTRAVENOUS
  Filled 2018-07-12 (×2): qty 200

## 2018-07-12 NOTE — Progress Notes (Signed)
Foley cath still showing as active in patients chart, but not present on admission.  Foley cath removed to de activate its presence for charting.  Unsure of true removal date

## 2018-07-12 NOTE — Progress Notes (Signed)
Late entry  Patient's abdominal CT resulted, MD made aware of results.

## 2018-07-12 NOTE — Progress Notes (Signed)
Pharmacy Antibiotic Note  [name] is a 52 y.o. female admitted on 07/11/2018 with intra-abdominal infection.  Pharmacy has been consulted for Ciprofloxacin dosing.  Plan: Ciprofloxacin 400 mg IV every 24 hours  Flagyl 500 mg IV every 8 hours Monitor labs, c/s, and patient improvement  Height: 5\' 1"  (154.9 cm) Weight: 169 lb (76.7 kg) IBW/kg (Calculated) : 47.8  Temp (24hrs), Avg:99 F (37.2 C), Min:98.4 F (36.9 C), Max:99.3 F (37.4 C)  Recent Labs  Lab 07/11/18 1550 07/12/18 0816  WBC 12.5* 10.6*  CREATININE 2.39* 2.20*    Estimated Creatinine Clearance: 27.4 mL/min (A) (by C-G formula based on SCr of 2.2 mg/dL (H)).    No Known Allergies  Antimicrobials this admission: Cipro 9/17 >>  Flagyl 9/17 >>   Dose adjustments this admission: Cipro  Microbiology results: 9/16 BCx: pending 9/16 UCx: pending  9/16 MRSA PCR: pending 9/16 C Diff: pending  Thank you for allowing pharmacy to be a part of this patient's care.  Ramond Craver 07/12/2018 11:21 AM

## 2018-07-12 NOTE — Progress Notes (Signed)
PROGRESS NOTE  [name]  MLY:[id number]  DOB: Jan 22, 1966  DOA: 07/11/2018 PCP: [name], MD   Brief Admission Hx: [name] is a 52 y.o. female with medical history significant for CKD, 3, DM2, CVA, polysubstance abuse alcohol and cocaine, systolic and diastolic CHF, COPD, atrial fibrillation.  Patient reports seen blood in her diapers multiple times since hospital discharge.  She was noted to have gross hematuria and blood loss anemia.  Summary from recent hospitalizations: Recent prolonged hospital admission 8/10- 06/20/18, for sepsis secondary to E. coli UTI, also pelvic and perihepatic abscesses requiring IR drain placements 8/21.  Patient discharged to complete a course of Augmentin.  Followed up with IR 07/05/18, catheter will was in a nonfunctional position, but was sticking out, as collection had largely resolved on repeat imaging- ct ABD/PELVIS w contrast, the pelvic abscess which was untreated demonstrated significant improvement with her percutaneous intervention.  Also had gross hematuria with drop in hemoglobin, urology suggested this was secondary to severe cystitis and to discharge patient on Foley.     MDM/Assessment & Plan:   1. Acute blood loss anemia- hemoglobin of 6.2 transfused with 1 unit PRBC, repeat hemoglobin is now 8.4.  The patient has gross hematuria and the CT scan is suggesting hemorrhage in the bladder.  Urology consultation has been requested.  The patient is hemodynamically stable at this time. 2. Acute kidney injury on CKD stage III- patient status post bolus of fluids given in the ED.  She has received some gentle hydration and her creatinine has slight improvement.  We will continue to follow. 3. Gross hematuria - CT reveals bilateral hydronephrosis and hydroureter and distended bladder with possible bladder hemorrhage.  Urology consultation is pending.  4. Anterior pelvic fluid collection - may represent abscess, will await surgeon  recommendations, may need to have drained.  5. Hypokalemia-oral replacement and check a magnesium level. 6. Diarrhea - Follow C diff and GI pathogen panel.  CT scan suggesting pelvic area bowel thickening which may be enteritis.  Obtain blood cultures x 2 and start cipro/flagyl.   7. Leukocytosis - WBC coming down which is reassuring.   8. Chronic systolic and diastolic CHF - EF 81-27% and grade 2 DD from echo 08/18.  Continue home metoprolol.   9. Type 2 DM on insulin - Pt on sliding scale coverage at this time. Levemir on hold for now.  10. Chronic atrial fibrillation -continue amiodarone and metoprolol for rate and rhythm control and apixaban for anticoagulation is currently being held due to the gross hematuria. 11. COPD - resume home bronchodilators. Currently stable.    DVT prophylaxis: SCDs Code Status: Full  Family Communication: pt updated at bedside Disposition Plan: further inpatient workup and subspecialty consultation  Consultants:  urology  Procedures:  TBD  Subjective: Pt says that she is hungry.  She has been NPO pending surgical consultation.   Objective: Vitals:   07/12/18 0147 07/12/18 0224 07/12/18 0550 07/12/18 0855  BP: 111/86 110/80 110/75   Pulse: 80 80 73   Resp: 16 16 16    Temp: 99.3 F (37.4 C) 98.8 F (37.1 C) 98.7 F (37.1 C)   TempSrc: Oral Oral Oral   SpO2: 96% 100% 97% 95%  Weight:      Height:        Intake/Output Summary (Last 24 hours) at 07/12/2018 1000 Last data filed at 07/12/2018 0500 Gross per 24 hour  Intake 918.31 ml  Output 400 ml  Net 518.31 ml  Filed Weights   07/11/18 1518  Weight: 76.7 kg   REVIEW OF SYSTEMS  As per history otherwise all reviewed and reported negative  Exam:  General exam: awake, alert, NAD, cooperative.   Respiratory system: Clear. No increased work of breathing. Cardiovascular system: S1 & S2 heard, RRR. No JVD, murmurs, gallops, clicks or pedal edema. Gastrointestinal system: Abdomen is  mildly distended, soft and generalized tenderness with light palpation, no guarding. Normal bowel sounds heard. Central nervous system: Alert and oriented. No focal neurological deficits. Extremities: no cyanosis or clubbing.  Data Reviewed: Basic Metabolic Panel: Recent Labs  Lab 07/11/18 1550 07/12/18 0816  NA 138 140  K 3.2* 3.2*  CL 105 108  CO2 28 23  GLUCOSE 193* 110*  BUN 39* 37*  CREATININE 2.39* 2.20*  CALCIUM 8.2* 8.4*  MG 1.7  --    Liver Function Tests: Recent Labs  Lab 07/11/18 1550  AST 10*  ALT 7  ALKPHOS 72  BILITOT 0.5  PROT 7.4  ALBUMIN 2.4*   Recent Labs  Lab 07/11/18 1550  LIPASE 21   No results for input(s): AMMONIA in the last 168 hours. CBC: Recent Labs  Lab 07/11/18 1550 07/12/18 0816  WBC 12.5* 10.6*  NEUTROABS 9.1*  --   HGB 6.2* 8.4*  HCT 21.7* 26.9*  MCV 96.0 93.1  PLT 397 316   Cardiac Enzymes: No results for input(s): CKTOTAL, CKMB, CKMBINDEX, TROPONINI in the last 168 hours. CBG (last 3)  Recent Labs    07/11/18 2029 07/12/18 0817  GLUCAP 87 105*   Recent Results (from the past 240 hour(s))  MRSA PCR Screening     Status: None   Collection Time: 07/11/18 11:57 PM  Result Value Ref Range Status   MRSA by PCR NEGATIVE NEGATIVE Final    Comment:        The GeneXpert MRSA Assay (FDA approved for NASAL specimens only), is one component of a comprehensive MRSA colonization surveillance program. It is not intended to diagnose MRSA infection nor to guide or monitor treatment for MRSA infections. Performed at [facility], [address], [place], [place] [address]      Studies: Ct Abdomen Pelvis Wo Contrast  Result Date: 07/12/2018 CLINICAL DATA:  Rectal bleeding and low hemoglobin. No pain. History of chronic kidney disease, congestive heart failure, COPD, hypertension, diabetes. EXAM: CT ABDOMEN AND PELVIS WITHOUT CONTRAST TECHNIQUE: Multidetector CT imaging of the abdomen and pelvis was performed following the  standard protocol without IV contrast. COMPARISON:  07/05/2018 FINDINGS: Lower chest: Atelectasis or consolidation in the right lung base with small pleural effusion, improving since previous study. Hepatobiliary: No focal liver lesions identified. Probable sludge in the gallbladder. No bile duct dilatation. There is a right subdiaphragmatic fluid collection with small gas bubble. The collection measures about 2.1 x 7.1 cm in diameter. No significant change in size of the collection since previous study. A pigtail drainage catheter seen previously has been removed in the interval. Pancreas: Unremarkable. No pancreatic ductal dilatation or surrounding inflammatory changes. Spleen: Normal in size without focal abnormality. Adrenals/Urinary Tract: No adrenal gland nodules. Bilateral hydronephrosis and hydroureter with distended bladder, possibly representing reflux disease. Urine in the bladder is somewhat increased in density which may indicate bladder hemorrhage or concentrated urine. No bladder wall thickening. No renal or ureteral stones are seen. Stomach/Bowel: Increasing size of anterior pelvic fluid collections, largest now measuring about 7.5 x 11.9 cm in diameter. This could represent abscess or hematoma. Additional smaller fluid collections also  demonstrated. Bowel are compressed and displaced by the fluid collections. Stomach, small bowel, and colon are not abnormally distended. Contrast material flows through the colon to the rectum without evidence of obstruction. Pelvic small bowel suggest wall thickening which may indicate enteritis. Vascular/Lymphatic: Aortic atherosclerosis. No enlarged abdominal or pelvic lymph nodes. Reproductive: Uterus and bilateral adnexa are unremarkable. Other: Small amount of free fluid in the abdomen and pelvis. No free air. Abdominal wall musculature appears intact. Musculoskeletal: Degenerative changes in the spine. No destructive bone lesions. IMPRESSION: 1. Right  subdiaphragmatic fluid collection with small gas bubble consistent with abscess. This is unchanged in size since previous study. Drainage catheter was removed in the interval. 2. Increasing size of anterior pelvic fluid collections, largest measuring 7.5 x 11.9 cm in diameter. This could represent abscess or hematoma. 3. Bilateral hydronephrosis and hydroureter with distended bladder, possibly representing reflux disease. Increased density of urine may indicate bladder hemorrhage. Correlate with urinalysis. 4. Small amount of free fluid in the abdomen and pelvis. 5. Atelectasis or consolidation in the right lung base with small pleural effusion, improving since previous study. Aortic Atherosclerosis (ICD10-I70.0). These results will be called to the ordering clinician or representative by the Radiologist Assistant, and communication documented in the PACS or zVision Dashboard. Electronically Signed   By: Lucienne Capers M.D.   On: 07/12/2018 01:53     Scheduled Meds: . amiodarone  200 mg Oral BID  . ezetimibe  10 mg Oral Daily  . FLUoxetine  20 mg Oral Daily  . insulin aspart  0-9 Units Subcutaneous TID WC  . linagliptin  5 mg Oral Daily  . metoprolol succinate  50 mg Oral BID  . montelukast  10 mg Oral QHS  . pantoprazole  40 mg Oral Daily  . potassium chloride SA  30 mEq Oral Daily  . rosuvastatin  10 mg Oral Daily  . tiotropium  18 mcg Inhalation Daily  . traZODone  50 mg Oral QHS   Continuous Infusions:  Principal Problem:   AKI (acute kidney injury) ([place]) Active Problems:   HTN (hypertension), malignant   Diabetic neuropathy (HCC)   CKD (chronic kidney disease) stage 3, GFR 30-59 ml/min ([facility])   Severe recurrent major depression without psychotic features ([place])   Polysubstance abuse (including cocaine)   Chronic combined systolic and diastolic CHF (congestive heart failure) ([facility])   COPD (chronic obstructive pulmonary disease) ([place])   Anemia   A-fib ([place])   Time spent:    Irwin Brakeman, MD, FAAFP [facility] Pager [phone]  If 7PM-7AM, please contact night-coverage [email or web address] Password [email or web address] 07/12/2018, 10:00 AM    LOS: 1 day

## 2018-07-12 NOTE — Consult Note (Signed)
Urology Consult   Physician requesting consult: Murvin Natal, MD  Reason for consult: Gross hematuria, retention  History of Present Illness: [name] is a 52 y.o. female with a significant medical history despite her age of 70.  She is admitted with gross hematuria as well as anemia.  The patient is on Eliquis for atrial fibrillation.  She recently started having gross hematuria and presented to the emergency room with this.  In the emergency room, creatinine was 2.39, hemoglobin 6.2.  She was admitted and is received 2 units packed red blood cells.  She is no longer on Eliquis.  Recent hospitalization approximately 1 month ago, with a 2-week inpatient stay was performed for sepsis secondary to E. coli UTI, as well as abscesses in her pelvis and perihepatic areas.  These were drained percutaneously.  CT scan recently revealed significant distended bladder, bilateral hydroureteronephrosis and a large pelvic fluid collection on the left.  Urologic consultation is requested for the hematuria, as well as hydronephrosis/urinary retention.  The patient over the past year or 2 has had problems with urinary control.  She ha is s not presented for urologic consultation.    Past Medical History:  Diagnosis Date  . Alcohol use   . Ankle fracture, lateral malleolus, closed 12/30/2011  . Anxiety   . Breast mass, left 12/15/2011  . Chronic anemia   . Chronic combined systolic and diastolic CHF (congestive heart failure) ([place])   . CKD (chronic kidney disease), stage III ([place])    pt denies  . Cocaine abuse ([place])   . COPD (chronic obstructive pulmonary disease) ([place])   . Diabetes mellitus, type 2 ([place])   . Diabetic Charcot foot ([place]) 12/30/2011  . Essential hypertension   . History of GI bleed 12/05/2009   Qualifier: Diagnosis of  By: Zeb Comfort    . Hyperlipidemia   . Hypertensive cardiomyopathy ([place]) 11/08/2012   a. 05/2016: echo showing EF of 20-25% with normal cors by cath in 07/2016.   Marland Kitchen Noncompliance with medication regimen   . Obesity   . Panic attacks   . PAT (paroxysmal atrial tachycardia) ([place])    a. started on amiodarone 07/2017 for this.  . Sleep apnea    does not use cpap  . Stroke ([place])    06/24/17  . Tobacco abuse   . Urinary incontinence     Past Surgical History:  Procedure Laterality Date  . BREAST BIOPSY    . CARDIAC CATHETERIZATION N/A 07/28/2016   Procedure: Left Heart Cath and Coronary Angiography;  Surgeon: Jettie Booze, MD;  Location: [facility];  Service: Cardiovascular;  Laterality: N/A;  . DILATION AND CURETTAGE OF UTERUS    . I&D EXTREMITY Bilateral 09/22/2017   Procedure: BILATERAL DEBRIDEMENT LEG/FOOT ULCERS, APPLY VERAFLO WOUND VAC;  Surgeon: Newt Minion, MD;  Location: [facility];  Service: Orthopedics;  Laterality: Bilateral;  . IR RADIOLOGIST EVAL & MGMT  07/05/2018  . SKIN SPLIT GRAFT Bilateral 09/28/2017   Procedure: BILATERAL SPLIT THICKNESS SKIN GRAFT LEGS/FEET AND APPLY VAC;  Surgeon: Newt Minion, MD;  Location: [facility];  Service: Orthopedics;  Laterality: Bilateral;     Current Hospital Medications: Scheduled Meds: . amiodarone  200 mg Oral BID  . ezetimibe  10 mg Oral Daily  . FLUoxetine  20 mg Oral Daily  . insulin aspart  0-9 Units Subcutaneous TID WC  . metoprolol succinate  50 mg Oral BID  . montelukast  10 mg Oral QHS  .  pantoprazole  40 mg Oral Daily  . potassium chloride SA  30 mEq Oral Daily  . rosuvastatin  10 mg Oral Daily  . tiotropium  18 mcg Inhalation Daily  . traZODone  50 mg Oral QHS   Continuous Infusions: . ciprofloxacin    . metronidazole     PRN Meds:.ipratropium-albuterol, ondansetron **OR** ondansetron (ZOFRAN) IV  Allergies: No Known Allergies  Family History  Problem Relation Age of Onset  . Hypertension Mother   . Heart attack Mother   . Hypertension Father        CABG   . Hypertension Sister   . Hypertension Brother   . Hypertension Sister   . Cancer Sister         breast   . Arthritis Unknown   . Cancer Unknown   . Diabetes Unknown   . Asthma Unknown   . Hypertension Daughter   . Hypertension Son     Social History:  reports that she quit smoking about 13 months ago. Her smoking use included cigarettes. She has a 20.00 pack-year smoking history. She has never used smokeless tobacco. She reports that she has current or past drug history. Drugs: Marijuana and Cocaine. Frequency: 3.00 times per week. She reports that she does not drink alcohol.  ROS: A complete review of systems was performed.  She has had poor urinary control for some time.  2 days of gross hematuria.  No significant dysuria.  Lower abdominal distention/discomfort.  No fevers, no chills.  Physical Exam:  Vital signs in last 24 hours: Temp:  [98.4 F (36.9 C)-99.3 F (37.4 C)] 98.7 F (37.1 C) (09/17 0550) Pulse Rate:  [73-91] 73 (09/17 0550) Resp:  [16-22] 16 (09/17 0550) BP: (107-136)/(70-91) 110/75 (09/17 0550) SpO2:  [94 %-100 %] 95 % (09/17 0855) Weight:  [76.7 kg] 76.7 kg (09/16 1518) General:  Alert and oriented, No acute distress HEENT: Normocephalic, atraumatic Neck: No JVD or lymphadenopathy Cardiovascular: Regular rate  Lungs: Normal inspiratory/expiratory excursion Abdomen: Distended, mild left lower quadrant tenderness. Back: No CVA tenderness Extremities: No edema Neurologic: Grossly intact  Laboratory Data:  Recent Labs    07/11/18 1550 07/12/18 0816  WBC 12.5* 10.6*  HGB 6.2* 8.4*  HCT 21.7* 26.9*  PLT 397 316    Recent Labs    07/11/18 1550 07/12/18 0816  NA 138 140  K 3.2* 3.2*  CL 105 108  GLUCOSE 193* 110*  BUN 39* 37*  CALCIUM 8.2* 8.4*  CREATININE 2.39* 2.20*     Results for orders placed or performed during the hospital encounter of 07/11/18 (from the past 24 hour(s))  Comprehensive metabolic panel     Status: Abnormal   Collection Time: 07/11/18  3:50 PM  Result Value Ref Range   Sodium 138 135 - 145 mmol/L   Potassium 3.2  (L) 3.5 - 5.1 mmol/L   Chloride 105 98 - 111 mmol/L   CO2 28 22 - 32 mmol/L   Glucose, Bld 193 (H) 70 - 99 mg/dL   BUN 39 (H) 6 - 20 mg/dL   Creatinine, Ser 2.39 (H) 0.44 - 1.00 mg/dL   Calcium 8.2 (L) 8.9 - 10.3 mg/dL   Total Protein 7.4 6.5 - 8.1 g/dL   Albumin 2.4 (L) 3.5 - 5.0 g/dL   AST 10 (L) 15 - 41 U/L   ALT 7 0 - 44 U/L   Alkaline Phosphatase 72 38 - 126 U/L   Total Bilirubin 0.5 0.3 - 1.2 mg/dL   GFR calc  non Af Amer 22 (L) >60 mL/min   GFR calc Af Amer 25 (L) >60 mL/min   Anion gap 5 5 - 15  Lipase, blood     Status: None   Collection Time: 07/11/18  3:50 PM  Result Value Ref Range   Lipase 21 11 - 51 U/L  CBC with Differential     Status: Abnormal   Collection Time: 07/11/18  3:50 PM  Result Value Ref Range   WBC 12.5 (H) 4.0 - 10.5 K/uL   RBC 2.26 (L) 3.87 - 5.11 MIL/uL   Hemoglobin 6.2 (LL) 12.0 - 15.0 g/dL   HCT 21.7 (L) 36.0 - 46.0 %   MCV 96.0 78.0 - 100.0 fL   MCH 27.4 26.0 - 34.0 pg   MCHC 28.6 (L) 30.0 - 36.0 g/dL   RDW 18.9 (H) 11.5 - 15.5 %   Platelets 397 150 - 400 K/uL   Neutrophils Relative % 72 %   Neutro Abs 9.1 (H) 1.7 - 7.7 K/uL   Lymphocytes Relative 17 %   Lymphs Abs 2.1 0.7 - 4.0 K/uL   Monocytes Relative 7 %   Monocytes Absolute 0.9 0.1 - 1.0 K/uL   Eosinophils Relative 3 %   Eosinophils Absolute 0.3 0.0 - 0.7 K/uL   Basophils Relative 1 %   Basophils Absolute 0.1 0.0 - 0.1 K/uL  Protime-INR     Status: Abnormal   Collection Time: 07/11/18  3:50 PM  Result Value Ref Range   Prothrombin Time 19.0 (H) 11.4 - 15.2 seconds   INR 1.61   Type and screen [facility]     Status: None (Preliminary result)   Collection Time: 07/11/18  3:50 PM  Result Value Ref Range   ABO/RH(D) O POS    Antibody Screen NEG    Sample Expiration 07/14/2018    Unit Number [id number]    Blood Component Type RED CELLS,LR    Unit division 00    Status of Unit ISSUED,FINAL    Transfusion Status OK TO TRANSFUSE    Crossmatch Result       Compatible Performed at [facility], [address], [place], [place] [address]    Unit Number [id number]    Blood Component Type RBC, LR IRR    Unit division 00    Status of Unit ISSUED    Transfusion Status OK TO TRANSFUSE    Crossmatch Result Compatible   Vitamin B12     Status: None   Collection Time: 07/11/18  3:50 PM  Result Value Ref Range   Vitamin B-12 526 180 - 914 pg/mL  Folate     Status: None   Collection Time: 07/11/18  3:50 PM  Result Value Ref Range   Folate 20.7 >5.9 ng/mL  Iron and TIBC     Status: None   Collection Time: 07/11/18  3:50 PM  Result Value Ref Range   Iron 31 28 - 170 ug/dL   TIBC 269 250 - 450 ug/dL   Saturation Ratios 12 10.4 - 31.8 %   UIBC 238 ug/dL  Ferritin     Status: None   Collection Time: 07/11/18  3:50 PM  Result Value Ref Range   Ferritin 225 11 - 307 ng/mL  Reticulocytes     Status: Abnormal   Collection Time: 07/11/18  3:50 PM  Result Value Ref Range   Retic Ct Pct 6.1 (H) 0.4 - 3.1 %   RBC. 2.26 (L) 3.87 - 5.11 MIL/uL   Retic Count,  Absolute 137.9 19.0 - 186.0 K/uL  Magnesium     Status: None   Collection Time: 07/11/18  3:50 PM  Result Value Ref Range   Magnesium 1.7 1.7 - 2.4 mg/dL  POC occult blood, ED Provider will collect     Status: None   Collection Time: 07/11/18  3:59 PM  Result Value Ref Range   Fecal Occult Bld NEGATIVE NEGATIVE  Urinalysis, Routine w reflex microscopic     Status: Abnormal   Collection Time: 07/11/18  4:09 PM  Result Value Ref Range   Color, Urine AMBER (A) YELLOW   APPearance CLOUDY (A) CLEAR   Specific Gravity, Urine 1.013 1.005 - 1.030   pH 6.0 5.0 - 8.0   Glucose, UA 50 (A) NEGATIVE mg/dL   Hgb urine dipstick LARGE (A) NEGATIVE   Bilirubin Urine NEGATIVE NEGATIVE   Ketones, ur NEGATIVE NEGATIVE mg/dL   Protein, ur 100 (A) NEGATIVE mg/dL   Nitrite NEGATIVE NEGATIVE   Leukocytes, UA TRACE (A) NEGATIVE   RBC / HPF >50 (H) 0 - 5 RBC/hpf   WBC, UA 0-5 0 - 5 WBC/hpf   Bacteria, UA  RARE (A) NONE SEEN  Prepare RBC     Status: None   Collection Time: 07/11/18  4:23 PM  Result Value Ref Range   Order Confirmation      ORDER PROCESSED BY BLOOD BANK Performed at [facility], [address], [place],  [address]   Glucose, capillary     Status: None   Collection Time: 07/11/18  8:29 PM  Result Value Ref Range   Glucose-Capillary 87 70 - 99 mg/dL   Comment 1 Notify RN    Comment 2 Document in Chart   MRSA PCR Screening     Status: None   Collection Time: 07/11/18 11:57 PM  Result Value Ref Range   MRSA by PCR NEGATIVE NEGATIVE  Basic metabolic panel     Status: Abnormal   Collection Time: 07/12/18  8:16 AM  Result Value Ref Range   Sodium 140 135 - 145 mmol/L   Potassium 3.2 (L) 3.5 - 5.1 mmol/L   Chloride 108 98 - 111 mmol/L   CO2 23 22 - 32 mmol/L   Glucose, Bld 110 (H) 70 - 99 mg/dL   BUN 37 (H) 6 - 20 mg/dL   Creatinine, Ser 2.20 (H) 0.44 - 1.00 mg/dL   Calcium 8.4 (L) 8.9 - 10.3 mg/dL   GFR calc non Af Amer 24 (L) >60 mL/min   GFR calc Af Amer 28 (L) >60 mL/min   Anion gap 9 5 - 15  CBC     Status: Abnormal   Collection Time: 07/12/18  8:16 AM  Result Value Ref Range   WBC 10.6 (H) 4.0 - 10.5 K/uL   RBC 2.89 (L) 3.87 - 5.11 MIL/uL   Hemoglobin 8.4 (L) 12.0 - 15.0 g/dL   HCT 26.9 (L) 36.0 - 46.0 %   MCV 93.1 78.0 - 100.0 fL   MCH 29.1 26.0 - 34.0 pg   MCHC 31.2 30.0 - 36.0 g/dL   RDW 16.6 (H) 11.5 - 15.5 %   Platelets 316 150 - 400 K/uL  Glucose, capillary     Status: Abnormal   Collection Time: 07/12/18  8:17 AM  Result Value Ref Range   Glucose-Capillary 105 (H) 70 - 99 mg/dL  Glucose, capillary     Status: Abnormal   Collection Time: 07/12/18 11:36 AM  Result Value Ref Range   Glucose-Capillary 108 (  H) 70 - 99 mg/dL   Recent Results (from the past 240 hour(s))  MRSA PCR Screening     Status: None   Collection Time: 07/11/18 11:57 PM  Result Value Ref Range Status   MRSA by PCR NEGATIVE NEGATIVE Final    Comment:        The  GeneXpert MRSA Assay (FDA approved for NASAL specimens only), is one component of a comprehensive MRSA colonization surveillance program. It is not intended to diagnose MRSA infection nor to guide or monitor treatment for MRSA infections. Performed at [facility], [address] [place], [place] [address]     Renal Function: Recent Labs    07/11/18 1550 07/12/18 0816  CREATININE 2.39* 2.20*   Estimated Creatinine Clearance: 27.4 mL/min (A) (by C-G formula based on SCr of 2.2 mg/dL (H)).  Radiologic Imaging: Ct Abdomen Pelvis Wo Contrast  Result Date: 07/12/2018 CLINICAL DATA:  Rectal bleeding and low hemoglobin. No pain. History of chronic kidney disease, congestive heart failure, COPD, hypertension, diabetes. EXAM: CT ABDOMEN AND PELVIS WITHOUT CONTRAST TECHNIQUE: Multidetector CT imaging of the abdomen and pelvis was performed following the standard protocol without IV contrast. COMPARISON:  07/05/2018 FINDINGS: Lower chest: Atelectasis or consolidation in the right lung base with small pleural effusion, improving since previous study. Hepatobiliary: No focal liver lesions identified. Probable sludge in the gallbladder. No bile duct dilatation. There is a right subdiaphragmatic fluid collection with small gas bubble. The collection measures about 2.1 x 7.1 cm in diameter. No significant change in size of the collection since previous study. A pigtail drainage catheter seen previously has been removed in the interval. Pancreas: Unremarkable. No pancreatic ductal dilatation or surrounding inflammatory changes. Spleen: Normal in size without focal abnormality. Adrenals/Urinary Tract: No adrenal gland nodules. Bilateral hydronephrosis and hydroureter with distended bladder, possibly representing reflux disease. Urine in the bladder is somewhat increased in density which may indicate bladder hemorrhage or concentrated urine. No bladder wall thickening. No renal or ureteral stones are seen.  Stomach/Bowel: Increasing size of anterior pelvic fluid collections, largest now measuring about 7.5 x 11.9 cm in diameter. This could represent abscess or hematoma. Additional smaller fluid collections also demonstrated. Bowel are compressed and displaced by the fluid collections. Stomach, small bowel, and colon are not abnormally distended. Contrast material flows through the colon to the rectum without evidence of obstruction. Pelvic small bowel suggest wall thickening which may indicate enteritis. Vascular/Lymphatic: Aortic atherosclerosis. No enlarged abdominal or pelvic lymph nodes. Reproductive: Uterus and bilateral adnexa are unremarkable. Other: Small amount of free fluid in the abdomen and pelvis. No free air. Abdominal wall musculature appears intact. Musculoskeletal: Degenerative changes in the spine. No destructive bone lesions. IMPRESSION: 1. Right subdiaphragmatic fluid collection with small gas bubble consistent with abscess. This is unchanged in size since previous study. Drainage catheter was removed in the interval. 2. Increasing size of anterior pelvic fluid collections, largest measuring 7.5 x 11.9 cm in diameter. This could represent abscess or hematoma. 3. Bilateral hydronephrosis and hydroureter with distended bladder, possibly representing reflux disease. Increased density of urine may indicate bladder hemorrhage. Correlate with urinalysis. 4. Small amount of free fluid in the abdomen and pelvis. 5. Atelectasis or consolidation in the right lung base with small pleural effusion, improving since previous study. Aortic Atherosclerosis (ICD10-I70.0). These results will be called to the ordering clinician or representative by the Radiologist Assistant, and communication documented in the PACS or zVision Dashboard. Electronically Signed   By: Oren Beckmann.D.  On: 07/12/2018 01:53    I independently reviewed the above imaging studies.  Impression/Assessment:  1.  Gross hematuria.   This may be related to hematuria associated with urinary tract infection.  This was present a month ago during hospitalization  2.  Urinary retention with significantly distended bladder and resultant bilateral hydronephrosis with associated acute renal failure  Plan:  1.  I have ordered a catheter be placed.  This can be irrigated on a regular basis.  More than likely, adequate bladder drainage will allow for better upper tract drainage and thus hopefully improve urinary function  2.  Follow-up regular creatinines, and if no significant improvement over the next couple of days, follow-up renal ultrasound  3.  For now, I strongly recommend that the catheter be left in until further urologic evaluation can be performed on an outpatient basis.  This more than likely will require urodynamic study  4.  We will follow her during her hospitalization

## 2018-07-12 NOTE — Progress Notes (Signed)
24 Fr 3 way cath placed without difficulty.  Dark red bloody urine immediately filled bag completely, approx 2000 cc.  Irrigation started and urine quickly faded to light pink.  No clots noted.  Patient with CBI at slow rate - urine remaining light pink with no clots.

## 2018-07-13 ENCOUNTER — Encounter (HOSPITAL_COMMUNITY): Payer: Self-pay | Admitting: *Deleted

## 2018-07-13 DIAGNOSIS — I5042 Chronic combined systolic (congestive) and diastolic (congestive) heart failure: Secondary | ICD-10-CM

## 2018-07-13 DIAGNOSIS — R310 Gross hematuria: Secondary | ICD-10-CM

## 2018-07-13 DIAGNOSIS — I482 Chronic atrial fibrillation: Secondary | ICD-10-CM

## 2018-07-13 DIAGNOSIS — D649 Anemia, unspecified: Secondary | ICD-10-CM

## 2018-07-13 DIAGNOSIS — N179 Acute kidney failure, unspecified: Principal | ICD-10-CM

## 2018-07-13 DIAGNOSIS — N1330 Unspecified hydronephrosis: Secondary | ICD-10-CM

## 2018-07-13 DIAGNOSIS — N183 Chronic kidney disease, stage 3 (moderate): Secondary | ICD-10-CM

## 2018-07-13 DIAGNOSIS — R338 Other retention of urine: Secondary | ICD-10-CM

## 2018-07-13 LAB — GASTROINTESTINAL PANEL BY PCR, STOOL (REPLACES STOOL CULTURE)

## 2018-07-13 LAB — GLUCOSE, CAPILLARY
Glucose-Capillary: 162 mg/dL — ABNORMAL HIGH (ref 70–99)
Glucose-Capillary: 132 mg/dL — ABNORMAL HIGH (ref 70–99)
Glucose-Capillary: 121 mg/dL — ABNORMAL HIGH (ref 70–99)

## 2018-07-13 LAB — URINE CULTURE
Culture: NO GROWTH
Special Requests: NORMAL

## 2018-07-13 LAB — RENAL FUNCTION PANEL
Albumin: 2 g/dL — ABNORMAL LOW (ref 3.5–5.0)
Anion gap: 6 (ref 5–15)
BUN: 25 mg/dL — ABNORMAL HIGH (ref 6–20)
CO2: 25 mmol/L (ref 22–32)
Calcium: 8.5 mg/dL — ABNORMAL LOW (ref 8.9–10.3)
Chloride: 113 mmol/L — ABNORMAL HIGH (ref 98–111)
Creatinine, Ser: 1.38 mg/dL — ABNORMAL HIGH (ref 0.44–1.00)
GFR calc Af Amer: 49 mL/min — ABNORMAL LOW (ref >60)
GFR calc non Af Amer: 42 mL/min — ABNORMAL LOW (ref >60)
Glucose, Bld: 144 mg/dL — ABNORMAL HIGH (ref 70–99)
Phosphorus: 3.1 mg/dL (ref 2.5–4.6)
Potassium: 3.1 mmol/L — ABNORMAL LOW (ref 3.5–5.1)
Sodium: 144 mmol/L (ref 135–145)

## 2018-07-13 LAB — BPAM RBC
Blood Product Expiration Date: 201910012359
Blood Product Expiration Date: 201910162359
ISSUE DATE / TIME: 201909162253
ISSUE DATE / TIME: 201909170204
Unit Type and Rh: 5100
Unit Type and Rh: 5100

## 2018-07-13 LAB — TYPE AND SCREEN
ABO/RH(D): O POS
Antibody Screen: NEGATIVE
Unit division: 0
Unit division: 0

## 2018-07-13 LAB — MAGNESIUM: Magnesium: 1.6 mg/dL — ABNORMAL LOW (ref 1.7–2.4)

## 2018-07-13 LAB — CBC WITH DIFFERENTIAL/PLATELET
Basophils Absolute: 0 10*3/uL (ref 0.0–0.1)
Basophils Relative: 0 %
Eosinophils Absolute: 0.3 10*3/uL (ref 0.0–0.7)
Eosinophils Relative: 3 %
HCT: 28.5 % — ABNORMAL LOW (ref 36.0–46.0)
Hemoglobin: 8.6 g/dL — ABNORMAL LOW (ref 12.0–15.0)
Lymphocytes Relative: 14 %
Lymphs Abs: 1.3 10*3/uL (ref 0.7–4.0)
MCH: 28.3 pg (ref 26.0–34.0)
MCHC: 30.2 g/dL (ref 30.0–36.0)
MCV: 93.8 fL (ref 78.0–100.0)
Monocytes Absolute: 0.4 10*3/uL (ref 0.1–1.0)
Monocytes Relative: 4 %
Neutro Abs: 7.1 10*3/uL (ref 1.7–7.7)
Neutrophils Relative %: 79 %
Platelets: 348 10*3/uL (ref 150–400)
RBC: 3.04 MIL/uL — ABNORMAL LOW (ref 3.87–5.11)
RDW: 17.3 % — ABNORMAL HIGH (ref 11.5–15.5)
WBC: 9.1 10*3/uL (ref 4.0–10.5)

## 2018-07-13 MED ORDER — CIPROFLOXACIN IN D5W 400 MG/200ML IV SOLN
400.0000 mg | Freq: Two times a day (BID) | INTRAVENOUS | Status: DC
  Administered 2018-07-13 – 2018-07-14 (×2): 400 mg via INTRAVENOUS
  Filled 2018-07-13 (×2): qty 200

## 2018-07-13 NOTE — Progress Notes (Signed)
PROGRESS NOTE    [name]  VFI:[id number] DOB: 1965/11/09 DOA: 07/11/2018 PCP: [name], MD     Brief Narrative:  52 year old woman admitted from SNF on 9/16 due to hematuria and blood loss anemia.  She has a history significant for chronic kidney disease stage III, diabetes, prior CVA, combined heart failure, atrial fibrillation, COPD, polysubstance abuse, alcohol abuse and cocaine use.   Assessment & Plan:   Principal Problem:   AKI (acute kidney injury) ([place]) Active Problems:   HTN (hypertension), malignant   Diabetic neuropathy (HCC)   CKD (chronic kidney disease) stage 3, GFR 30-59 ml/min (HCC)   Severe recurrent major depression without psychotic features ([place])   Polysubstance abuse (including cocaine)   Chronic combined systolic and diastolic CHF (congestive heart failure) (HCC)   COPD (chronic obstructive pulmonary disease) (HCC)   Anemia   A-fib (HCC)   Acute blood loss anemia -Due to hematuria. -Hemoglobin was 6.2 on admission was transfused 1 unit of PRBCs with a hemoglobin of 8.4.  Hematuria -Gross hematuria has resolved.  Seen by urology today who recommends discontinuing CBI.  Acute urinary retention -With bilateral hydronephrosis on CT scan on admission. -Urology recommends keeping Foley catheter in place upon discharge, he also believes patient needs a repeat ultrasound tomorrow to ensure resolution of hydronephrosis prior to discharge home.  Diarrhea -Has had none since admission, C. difficile and GI pathogen panel have resulted negative.  -Discontinue enteric precautions.  Chronic combined heart failure -Echo from August 2018 with ejection fraction of 30 to 35% and grade 2 diastolic dysfunction. -Has been stable this hospitalization.  Type 2 diabetes, insulin-dependent -Fair control.  Chronic atrial fibrillation -Continue amiodarone and metoprolol. -We will need to determine when safe to resume apixaban, will discuss with urology  in a.m.   DVT prophylaxis: SCDs Code Status: Full code Family Communication: Patient only Disposition Plan: Likely discharge home in 24 hours  Consultants:   Urology  Procedures:   None  Antimicrobials:  Anti-infectives (From admission, onward)   Start     Dose/Rate Route Frequency Ordered Stop   07/14/18 0000  ciprofloxacin (CIPRO) IVPB 400 mg     400 mg 200 mL/hr over 60 Minutes Intravenous Every 12 hours 07/13/18 1247     07/12/18 1200  ciprofloxacin (CIPRO) IVPB 400 mg  Status:  Discontinued     400 mg 200 mL/hr over 60 Minutes Intravenous Every 24 hours 07/12/18 1121 07/13/18 1247   07/12/18 1130  metroNIDAZOLE (FLAGYL) IVPB 500 mg     500 mg 100 mL/hr over 60 Minutes Intravenous Every 8 hours 07/12/18 1117         Subjective: Lying in bed, very anxious to be discharged home, does not want to go back to SNF.  Objective: Vitals:   07/12/18 1742 07/12/18 2157 07/13/18 0643 07/13/18 1600  BP: 124/85 127/82 (!) 151/95 (!) 154/75  Pulse: 80 80 74 73  Resp: 20 18 17    Temp: 98.1 F (36.7 C) 98.2 F (36.8 C) 98.4 F (36.9 C) 98.1 F (36.7 C)  TempSrc:  Oral Oral Oral  SpO2: 97% 96% 98% 97%  Weight:      Height:        Intake/Output Summary (Last 24 hours) at 07/13/2018 1638 Last data filed at 07/13/2018 1542 Gross per 24 hour  Intake 10555.24 ml  Output 18700 ml  Net -8144.76 ml   Filed Weights   07/11/18 1518  Weight: 76.7 kg    Examination:  General exam:  Alert, awake, oriented x 3, obese Respiratory system: Clear to auscultation. Respiratory effort normal. Cardiovascular system:RRR. No murmurs, rubs, gallops. Gastrointestinal system: Abdomen is obese, nondistended, soft and nontender. No organomegaly or masses felt. Normal bowel sounds heard. Central nervous system: Alert and oriented. No focal neurological deficits. Extremities: No C/C/E, +pedal pulses Skin: No rashes, lesions or ulcers Psychiatry: Judgement and insight appear normal. Mood &  affect appropriate.     Data Reviewed: I have personally reviewed following labs and imaging studies  CBC: Recent Labs  Lab 07/11/18 1550 07/12/18 0816 07/13/18 1030  WBC 12.5* 10.6* 9.1  NEUTROABS 9.1*  --  7.1  HGB 6.2* 8.4* 8.6*  HCT 21.7* 26.9* 28.5*  MCV 96.0 93.1 93.8  PLT 397 316 939   Basic Metabolic Panel: Recent Labs  Lab 07/11/18 1550 07/12/18 0816 07/13/18 1030  NA 138 140 144  K 3.2* 3.2* 3.1*  CL 105 108 113*  CO2 28 23 25   GLUCOSE 193* 110* 144*  BUN 39* 37* 25*  CREATININE 2.39* 2.20* 1.38*  CALCIUM 8.2* 8.4* 8.5*  MG 1.7  --  1.6*  PHOS  --   --  3.1   GFR: Estimated Creatinine Clearance: 43.7 mL/min (A) (by C-G formula based on SCr of 1.38 mg/dL (H)). Liver Function Tests: Recent Labs  Lab 07/11/18 1550 07/13/18 1030  AST 10*  --   ALT 7  --   ALKPHOS 72  --   BILITOT 0.5  --   PROT 7.4  --   ALBUMIN 2.4* 2.0*   Recent Labs  Lab 07/11/18 1550  LIPASE 21   No results for input(s): AMMONIA in the last 168 hours. Coagulation Profile: Recent Labs  Lab 07/11/18 1550  INR 1.61   Cardiac Enzymes: No results for input(s): CKTOTAL, CKMB, CKMBINDEX, TROPONINI in the last 168 hours. BNP (last 3 results) No results for input(s): PROBNP in the last 8760 hours. HbA1C: No results for input(s): HGBA1C in the last 72 hours. CBG: Recent Labs  Lab 07/12/18 1136 07/12/18 1651 07/12/18 2224 07/13/18 0905 07/13/18 1149  GLUCAP 108* 183* 151* 162* 132*   Lipid Profile: No results for input(s): CHOL, HDL, LDLCALC, TRIG, CHOLHDL, LDLDIRECT in the last 72 hours. Thyroid Function Tests: No results for input(s): TSH, T4TOTAL, FREET4, T3FREE, THYROIDAB in the last 72 hours. Anemia Panel: Recent Labs    07/11/18 1550  VITAMINB12 526  FOLATE 20.7  FERRITIN 225  TIBC 269  IRON 31  RETICCTPCT 6.1*   Urine analysis:    Component Value Date/Time   COLORURINE AMBER (A) 07/11/2018 1609   APPEARANCEUR CLOUDY (A) 07/11/2018 1609   LABSPEC  1.013 07/11/2018 1609   PHURINE 6.0 07/11/2018 1609   GLUCOSEU 50 (A) 07/11/2018 1609   HGBUR LARGE (A) 07/11/2018 1609   BILIRUBINUR NEGATIVE 07/11/2018 1609   KETONESUR NEGATIVE 07/11/2018 1609   PROTEINUR 100 (A) 07/11/2018 1609   UROBILINOGEN 0.2 09/11/2014 1319   NITRITE NEGATIVE 07/11/2018 1609   LEUKOCYTESUR TRACE (A) 07/11/2018 1609   Sepsis Labs: @LABRCNTIP (procalcitonin:4,lacticidven:4)  ) Recent Results (from the past 240 hour(s))  Urine culture     Status: None   Collection Time: 07/11/18  4:09 PM  Result Value Ref Range Status   Specimen Description   Final    URINE, CATHETERIZED Performed at [facility], [address], [place], [place] [address]    Special Requests   Final    Normal Performed at [facility], [address], [place], [place] [address]  Culture   Final    NO GROWTH Performed at [facility], [place] [address] [place], [place] [address]    Report Status 07/13/2018 FINAL  Final  MRSA PCR Screening     Status: None   Collection Time: 07/11/18 11:57 PM  Result Value Ref Range Status   MRSA by PCR NEGATIVE NEGATIVE Final    Comment:        The GeneXpert MRSA Assay (FDA approved for NASAL specimens only), is one component of a comprehensive MRSA colonization surveillance program. It is not intended to diagnose MRSA infection nor to guide or monitor treatment for MRSA infections. Performed at [facility], [address] [place], [place] [address]   Culture, blood (Routine X 2) w Reflex to ID Panel     Status: None (Preliminary result)   Collection Time: 07/12/18  1:11 PM  Result Value Ref Range Status   Specimen Description BLOOD LEFT ANTECUBITAL  Final   Special Requests   Final    BOTTLES DRAWN AEROBIC AND ANAEROBIC Blood Culture adequate volume   Culture   Final    NO GROWTH < 24 HOURS Performed at [facility], [address] [place], [place] [address]    Report Status PENDING  Incomplete  Culture, blood (Routine X 2) w  Reflex to ID Panel     Status: None (Preliminary result)   Collection Time: 07/12/18  1:13 PM  Result Value Ref Range Status   Specimen Description BLOOD LEFT WRIST  Final   Special Requests   Final    BOTTLES DRAWN AEROBIC AND ANAEROBIC Blood Culture adequate volume   Culture   Final    NO GROWTH < 24 HOURS Performed at [facility], [address], [place], [place] [address]    Report Status PENDING  Incomplete  C difficile quick scan w PCR reflex     Status: None   Collection Time: 07/12/18  1:14 PM  Result Value Ref Range Status   C Diff antigen NEGATIVE NEGATIVE Final   C Diff toxin NEGATIVE NEGATIVE Final   C Diff interpretation No C. difficile detected.  Final    Comment: Performed at [facility], [address], [place], [place] [address]  Gastrointestinal Panel by PCR , Stool     Status: None   Collection Time: 07/12/18  1:14 PM  Result Value Ref Range Status   Campylobacter species NOT DETECTED NOT DETECTED Final   Plesimonas shigelloides NOT DETECTED NOT DETECTED Final   Salmonella species NOT DETECTED NOT DETECTED Final   Yersinia enterocolitica NOT DETECTED NOT DETECTED Final   Vibrio species NOT DETECTED NOT DETECTED Final   Vibrio cholerae NOT DETECTED NOT DETECTED Final   Enteroaggregative E coli (EAEC) NOT DETECTED NOT DETECTED Final   Enteropathogenic E coli (EPEC) NOT DETECTED NOT DETECTED Final   Enterotoxigenic E coli (ETEC) NOT DETECTED NOT DETECTED Final   Shiga like toxin producing E coli (STEC) NOT DETECTED NOT DETECTED Final   Shigella/Enteroinvasive E coli (EIEC) NOT DETECTED NOT DETECTED Final   Cryptosporidium NOT DETECTED NOT DETECTED Final   Cyclospora cayetanensis NOT DETECTED NOT DETECTED Final   Entamoeba histolytica NOT DETECTED NOT DETECTED Final   Giardia lamblia NOT DETECTED NOT DETECTED Final   Adenovirus F40/41 NOT DETECTED NOT DETECTED Final   Astrovirus NOT DETECTED NOT DETECTED Final   Norovirus GI/GII NOT DETECTED NOT DETECTED Final    Rotavirus A NOT DETECTED NOT DETECTED Final   Sapovirus (I, II, IV, and V) NOT  DETECTED NOT DETECTED Final    Comment: Performed at [facility], [address], [place], [place] [address]         Radiology Studies: Ct Abdomen Pelvis Wo Contrast  Result Date: 07/12/2018 CLINICAL DATA:  Rectal bleeding and low hemoglobin. No pain. History of chronic kidney disease, congestive heart failure, COPD, hypertension, diabetes. EXAM: CT ABDOMEN AND PELVIS WITHOUT CONTRAST TECHNIQUE: Multidetector CT imaging of the abdomen and pelvis was performed following the standard protocol without IV contrast. COMPARISON:  07/05/2018 FINDINGS: Lower chest: Atelectasis or consolidation in the right lung base with small pleural effusion, improving since previous study. Hepatobiliary: No focal liver lesions identified. Probable sludge in the gallbladder. No bile duct dilatation. There is a right subdiaphragmatic fluid collection with small gas bubble. The collection measures about 2.1 x 7.1 cm in diameter. No significant change in size of the collection since previous study. A pigtail drainage catheter seen previously has been removed in the interval. Pancreas: Unremarkable. No pancreatic ductal dilatation or surrounding inflammatory changes. Spleen: Normal in size without focal abnormality. Adrenals/Urinary Tract: No adrenal gland nodules. Bilateral hydronephrosis and hydroureter with distended bladder, possibly representing reflux disease. Urine in the bladder is somewhat increased in density which may indicate bladder hemorrhage or concentrated urine. No bladder wall thickening. No renal or ureteral stones are seen. Stomach/Bowel: Increasing size of anterior pelvic fluid collections, largest now measuring about 7.5 x 11.9 cm in diameter. This could represent abscess or hematoma. Additional smaller fluid collections also demonstrated. Bowel are compressed and displaced by the fluid collections. Stomach, small bowel,  and colon are not abnormally distended. Contrast material flows through the colon to the rectum without evidence of obstruction. Pelvic small bowel suggest wall thickening which may indicate enteritis. Vascular/Lymphatic: Aortic atherosclerosis. No enlarged abdominal or pelvic lymph nodes. Reproductive: Uterus and bilateral adnexa are unremarkable. Other: Small amount of free fluid in the abdomen and pelvis. No free air. Abdominal wall musculature appears intact. Musculoskeletal: Degenerative changes in the spine. No destructive bone lesions. IMPRESSION: 1. Right subdiaphragmatic fluid collection with small gas bubble consistent with abscess. This is unchanged in size since previous study. Drainage catheter was removed in the interval. 2. Increasing size of anterior pelvic fluid collections, largest measuring 7.5 x 11.9 cm in diameter. This could represent abscess or hematoma. 3. Bilateral hydronephrosis and hydroureter with distended bladder, possibly representing reflux disease. Increased density of urine may indicate bladder hemorrhage. Correlate with urinalysis. 4. Small amount of free fluid in the abdomen and pelvis. 5. Atelectasis or consolidation in the right lung base with small pleural effusion, improving since previous study. Aortic Atherosclerosis (ICD10-I70.0). These results will be called to the ordering clinician or representative by the Radiologist Assistant, and communication documented in the PACS or zVision Dashboard. Electronically Signed   By: Lucienne Capers M.D.   On: 07/12/2018 01:53        Scheduled Meds: . amiodarone  200 mg Oral BID  . ezetimibe  10 mg Oral Daily  . FLUoxetine  20 mg Oral Daily  . insulin aspart  0-9 Units Subcutaneous TID WC  . metoprolol succinate  50 mg Oral BID  . montelukast  10 mg Oral QHS  . pantoprazole  40 mg Oral Daily  . potassium chloride SA  30 mEq Oral Daily  . rosuvastatin  10 mg Oral Daily  . tiotropium  18 mcg Inhalation Daily  .  traZODone  50 mg Oral QHS   Continuous Infusions: . [START ON 07/14/2018] ciprofloxacin    .  metronidazole 500 mg (07/13/18 1154)     LOS: 2 days    Time spent: 35 minutes. Greater than 50% of this time was spent in direct contact with the patient, coordinating care and discussing relevant ongoing clinical issues, including plan to discontinue CBI, keep Foley catheter in place after discharge with outpatient urology follow-up, plan to repeat renal ultrasound in a.m.Marland Kitchen     Lelon Frohlich, MD [facility] Pager [phone]  If 7PM-7AM, please contact night-coverage [email or web address] Password [facility] 07/13/2018, 4:38 PM

## 2018-07-13 NOTE — Care Management Note (Signed)
Case Management Note  Patient Details  Name: DAVION MEARA MRN: [id number] Date of Birth: 01-15-1966  Subjective/Objective:       AKI, hematuria. From [place], there for rehab. Patient reports she only had one day left at [facility] and prefers to go home. She had been set up with [facility] previously. Will refer again. Patient reports she has RW at home. She has PCP-Dr. Moshe Cipro, and insurance with prescription coverage.              Action/Plan: DC home with home health. Tim of [facility] made aware and will obtain orders via Epic. Potential DC today.   Expected Discharge Date:     07/13/18             Expected Discharge Plan:  [place]  In-House Referral:  Clinical Social Work  Discharge planning Services  CM Consult  Post Acute Care Choice:  Home Health Choice offered to:  Patient  DME Arranged:    DME Agency:     HH Arranged:  PT, RN [place] Agency:  [facility] (formerly [employer])  Status of Service:  Completed, signed off  If discussed at [employer], dates discussed:    Additional Comments:  Natania Finigan, Chauncey [place], RN 07/13/2018, 2:54 PM

## 2018-07-13 NOTE — Care Management Important Message (Signed)
Important Message  Patient Details  Name: [name] MRN: [id number] Date of Birth: 1966-10-01   Medicare Important Message Given:  Yes Patient on precautions.; gave letter  to NCM.   Holli Humbles Smith 07/13/2018, 10:56 AM

## 2018-07-13 NOTE — Progress Notes (Signed)
PT Cancellation Note  Patient Details Name: [name] MRN: [id number] DOB: September 07, 1966   Cancelled Treatment:    Reason Eval/Treat Not Completed: Patient declined, no reason specified.  Initially patient refused therapy secondary to c/o back pain, given pain medication by RN and later refused therapy again stating that she did all her therapy at [facility] and is going home after discharge from the hospital, also states she discussed this with her doctor, but unable to specify which doctor - RN, case manger, [place] notified.   2:03 PM, 07/13/18 Lonell Grandchild, MPT Physical Therapist with [facility] [phone] office [phone] mobile phone

## 2018-07-13 NOTE — Progress Notes (Signed)
Subjective: Urine cleared overnight. CBI very slow drip. Creatinine 1.38 today. No pain  Objective: Vital signs in last 24 hours: Temp:  [98.1 F (36.7 C)-98.4 F (36.9 C)] 98.4 F (36.9 C) (09/18 0643) Pulse Rate:  [74-80] 74 (09/18 0643) Resp:  [17-20] 17 (09/18 0643) BP: (124-151)/(82-95) 151/95 (09/18 0643) SpO2:  [96 %-98 %] 98 % (09/18 0643)  Intake/Output from previous day: 09/17 0701 - 09/18 0700 In: 17039.8 [P.O.:720; IV Piggyback:469.8] Out: 19700 [Urine:19700] Intake/Output this shift: Total I/O In: 55.5 [IV Piggyback:55.5] Out: 4150 [Urine:4150]  Physical Exam:  General:alert, cooperative and appears stated age GI: soft, non tender, normal bowel sounds, no palpable masses, no organomegaly, no inguinal hernia Female genitalia: not done Extremities: edema 2+  Lab Results: Recent Labs    07/11/18 1550 07/12/18 0816 07/13/18 1030  HGB 6.2* 8.4* 8.6*  HCT 21.7* 26.9* 28.5*   BMET Recent Labs    07/12/18 0816 07/13/18 1030  NA 140 144  K 3.2* 3.1*  CL 108 113*  CO2 23 25  GLUCOSE 110* 144*  BUN 37* 25*  CREATININE 2.20* 1.38*  CALCIUM 8.4* 8.5*   Recent Labs    07/11/18 1550  INR 1.61   No results for input(s): LABURIN in the last 72 hours. Results for orders placed or performed during the hospital encounter of 07/11/18  Urine culture     Status: None   Collection Time: 07/11/18  4:09 PM  Result Value Ref Range Status   Specimen Description   Final    URINE, CATHETERIZED Performed at [facility], [address], [place], [place] [address]    Special Requests   Final    Normal Performed at [facility], [address] [place], [place] [address]    Culture   Final    NO GROWTH Performed at [facility], [place] [address], [place], [place] [address]    Report Status 07/13/2018 FINAL  Final  MRSA PCR Screening     Status: None   Collection Time: 07/11/18 11:57 PM  Result Value Ref Range Status   MRSA by PCR NEGATIVE NEGATIVE Final     Comment:        The GeneXpert MRSA Assay (FDA approved for NASAL specimens only), is one component of a comprehensive MRSA colonization surveillance program. It is not intended to diagnose MRSA infection nor to guide or monitor treatment for MRSA infections. Performed at [facility], [address], [place], [place] [address]   Culture, blood (Routine X 2) w Reflex to ID Panel     Status: None (Preliminary result)   Collection Time: 07/12/18  1:11 PM  Result Value Ref Range Status   Specimen Description BLOOD LEFT ANTECUBITAL  Final   Special Requests   Final    BOTTLES DRAWN AEROBIC AND ANAEROBIC Blood Culture adequate volume   Culture   Final    NO GROWTH < 24 HOURS Performed at [facility], [address] [place], [place] [address]    Report Status PENDING  Incomplete  Culture, blood (Routine X 2) w Reflex to ID Panel     Status: None (Preliminary result)   Collection Time: 07/12/18  1:13 PM  Result Value Ref Range Status   Specimen Description BLOOD LEFT WRIST  Final   Special Requests   Final    BOTTLES DRAWN AEROBIC AND ANAEROBIC Blood Culture adequate volume   Culture   Final    NO GROWTH < 24 HOURS Performed at [facility], [place]  [address], [place], [place] [address]    Report Status PENDING  Incomplete  C difficile quick scan w PCR reflex     Status: None   Collection Time: 07/12/18  1:14 PM  Result Value Ref Range Status   C Diff antigen NEGATIVE NEGATIVE Final   C Diff toxin NEGATIVE NEGATIVE Final   C Diff interpretation No C. difficile detected.  Final    Comment: Performed at [facility], [address] [place], [place] [address]  Gastrointestinal Panel by PCR , Stool     Status: None   Collection Time: 07/12/18  1:14 PM  Result Value Ref Range Status   Campylobacter species NOT DETECTED NOT DETECTED Final   Plesimonas shigelloides NOT DETECTED NOT DETECTED Final   Salmonella species NOT DETECTED NOT DETECTED Final   Yersinia enterocolitica NOT  DETECTED NOT DETECTED Final   Vibrio species NOT DETECTED NOT DETECTED Final   Vibrio cholerae NOT DETECTED NOT DETECTED Final   Enteroaggregative E coli (EAEC) NOT DETECTED NOT DETECTED Final   Enteropathogenic E coli (EPEC) NOT DETECTED NOT DETECTED Final   Enterotoxigenic E coli (ETEC) NOT DETECTED NOT DETECTED Final   Shiga like toxin producing E coli (STEC) NOT DETECTED NOT DETECTED Final   Shigella/Enteroinvasive E coli (EIEC) NOT DETECTED NOT DETECTED Final   Cryptosporidium NOT DETECTED NOT DETECTED Final   Cyclospora cayetanensis NOT DETECTED NOT DETECTED Final   Entamoeba histolytica NOT DETECTED NOT DETECTED Final   Giardia lamblia NOT DETECTED NOT DETECTED Final   Adenovirus F40/41 NOT DETECTED NOT DETECTED Final   Astrovirus NOT DETECTED NOT DETECTED Final   Norovirus GI/GII NOT DETECTED NOT DETECTED Final   Rotavirus A NOT DETECTED NOT DETECTED Final   Sapovirus (I, II, IV, and V) NOT DETECTED NOT DETECTED Final    Comment: Performed at [facility], [address] [place],  [address]    Studies/Results: Ct Abdomen Pelvis Wo Contrast  Result Date: 07/12/2018 CLINICAL DATA:  Rectal bleeding and low hemoglobin. No pain. History of chronic kidney disease, congestive heart failure, COPD, hypertension, diabetes. EXAM: CT ABDOMEN AND PELVIS WITHOUT CONTRAST TECHNIQUE: Multidetector CT imaging of the abdomen and pelvis was performed following the standard protocol without IV contrast. COMPARISON:  07/05/2018 FINDINGS: Lower chest: Atelectasis or consolidation in the right lung base with small pleural effusion, improving since previous study. Hepatobiliary: No focal liver lesions identified. Probable sludge in the gallbladder. No bile duct dilatation. There is a right subdiaphragmatic fluid collection with small gas bubble. The collection measures about 2.1 x 7.1 cm in diameter. No significant change in size of the collection since previous study. A pigtail drainage  catheter seen previously has been removed in the interval. Pancreas: Unremarkable. No pancreatic ductal dilatation or surrounding inflammatory changes. Spleen: Normal in size without focal abnormality. Adrenals/Urinary Tract: No adrenal gland nodules. Bilateral hydronephrosis and hydroureter with distended bladder, possibly representing reflux disease. Urine in the bladder is somewhat increased in density which may indicate bladder hemorrhage or concentrated urine. No bladder wall thickening. No renal or ureteral stones are seen. Stomach/Bowel: Increasing size of anterior pelvic fluid collections, largest now measuring about 7.5 x 11.9 cm in diameter. This could represent abscess or hematoma. Additional smaller fluid collections also demonstrated. Bowel are compressed and displaced by the fluid collections. Stomach, small bowel, and colon are not abnormally distended. Contrast material flows through the colon to the rectum without evidence of obstruction. Pelvic small bowel suggest wall thickening which may indicate enteritis. Vascular/Lymphatic: Aortic atherosclerosis. No enlarged  abdominal or pelvic lymph nodes. Reproductive: Uterus and bilateral adnexa are unremarkable. Other: Small amount of free fluid in the abdomen and pelvis. No free air. Abdominal wall musculature appears intact. Musculoskeletal: Degenerative changes in the spine. No destructive bone lesions. IMPRESSION: 1. Right subdiaphragmatic fluid collection with small gas bubble consistent with abscess. This is unchanged in size since previous study. Drainage catheter was removed in the interval. 2. Increasing size of anterior pelvic fluid collections, largest measuring 7.5 x 11.9 cm in diameter. This could represent abscess or hematoma. 3. Bilateral hydronephrosis and hydroureter with distended bladder, possibly representing reflux disease. Increased density of urine may indicate bladder hemorrhage. Correlate with urinalysis. 4. Small amount of free  fluid in the abdomen and pelvis. 5. Atelectasis or consolidation in the right lung base with small pleural effusion, improving since previous study. Aortic Atherosclerosis (ICD10-I70.0). These results will be called to the ordering clinician or representative by the Radiologist Assistant, and communication documented in the PACS or zVision Dashboard. Electronically Signed   By: Lucienne Capers M.D.   On: 07/12/2018 01:53    Assessment/Plan: 52yo with urinary retention, bilateral hydronephrosis and gross hematuria  1. Gross hematuria: Resolved. Please discontinue CBI 2. Urinary retention: Please continue indwelling foley catheter. She should be discharged with the foley and followup at [facility] in 1 week 3. Bilateral hydronephrosis: It is likely related to an overly distended bladder. creatinine is improving with an indwelling foley. Please repeat renal US tomorrow to ensure resolution of the hydronephrosis   LOS: 2 days   Nicolette Bang 07/13/2018, 1:39 PM

## 2018-07-14 ENCOUNTER — Inpatient Hospital Stay (HOSPITAL_COMMUNITY): Payer: Medicare Other

## 2018-07-14 LAB — CBC
HCT: 27.3 % — ABNORMAL LOW (ref 36.0–46.0)
Hemoglobin: 8.4 g/dL — ABNORMAL LOW (ref 12.0–15.0)
MCH: 28.9 pg (ref 26.0–34.0)
MCHC: 30.8 g/dL (ref 30.0–36.0)
MCV: 93.8 fL (ref 78.0–100.0)
Platelets: 364 10*3/uL (ref 150–400)
RBC: 2.91 MIL/uL — ABNORMAL LOW (ref 3.87–5.11)
RDW: 17.2 % — ABNORMAL HIGH (ref 11.5–15.5)
WBC: 9.7 10*3/uL (ref 4.0–10.5)

## 2018-07-14 LAB — GLUCOSE, CAPILLARY
Glucose-Capillary: 116 mg/dL — ABNORMAL HIGH (ref 70–99)
Glucose-Capillary: 135 mg/dL — ABNORMAL HIGH (ref 70–99)
Glucose-Capillary: 105 mg/dL — ABNORMAL HIGH (ref 70–99)

## 2018-07-14 NOTE — Clinical Social Work Note (Signed)
Late Entry for 07/13/18  [name] at [facility] indicated that patient was scheduled to discharge on 07/14/18 as PT had discharged her due to being back at baseline.   Per Physical Therapist, patient is going to go home at discharge and does not plan on returning to SNF.   Case management is aware and will arrange [facility] services.      [name], Clydene Pugh, LCSW

## 2018-07-14 NOTE — Care Management (Signed)
DC home today with HH. [facility], aware of DC.

## 2018-07-14 NOTE — Discharge Summary (Signed)
Physician Discharge Summary  [name] TDH:[id number] DOB: 07-01-1966 DOA: 07/11/2018  PCP: [name], MD  Admit date: 07/11/2018 Discharge date: 07/14/2018  Time spent: 45 minutes  Recommendations for Outpatient Follow-up:  -Will be discharged home today. -Will follow up with urology in 1 week for voiding trials (will leave with catheter in place). -Advised to hold Eliquis until Monday (48 hours) per urology recommendations. If urine is not red, may resume then. If bloody urine knows to contact urology prior to eliquis resumption.   Discharge Diagnoses:  Principal Problem:   AKI (acute kidney injury) ([place]) Active Problems:   HTN (hypertension), malignant   Diabetic neuropathy (HCC)   CKD (chronic kidney disease) stage 3, GFR 30-59 ml/min (HCC)   Severe recurrent major depression without psychotic features ([place])   Polysubstance abuse (including cocaine)   Chronic combined systolic and diastolic CHF (congestive heart failure) (HCC)   COPD (chronic obstructive pulmonary disease) ([place])   Anemia   A-fib ([place])   Discharge Condition: Stable and improved  Filed Weights   07/11/18 1518  Weight: 76.7 kg    History of present illness:  As per Dr. Denton Brick on 9/16: [name] is a 52 y.o. female with medical history significant for CKD, 3, DM2, CVA, polysubstance abuse alcohol and cocaine, systolic and diastolic CHF, COPD, atrial fibrillation.  Patient reports seen blood in her diapers multiple times since hospital discharge, she is unsure where it is coming from.  At the nursing home her hemoglobin was being monitored closely, dropped gradually to 6.2 today, she was subsequently sent to the ED. Patient reports multiple episodes of watery stools since discharge, multiple times a day.  She denies abdominal pain.  No nausea no vomiting.  Endorses okay p.o. Intake.  No shortness of breath no chest pain  Recent prolonged hospital admission 8/10- 06/20/18, for sepsis  secondary to E. coli UTI, also pelvic and perihepatic abscesses requiring IR drain placements 8/21.  Patient discharged to complete a course of Augmentin.  Followed up with IR 07/05/18, catheter will was in a nonfunctional position, but was sticking out, as collection had largely resolved on repeat imaging- ct ABD/PELVIS w contrast, the pelvic abscess which was untreated demonstrated significant improvement with her percutaneous intervention.  Also had gross hematuria with drop in hemoglobin, urology suggested this was secondary to severe cystitis and to discharge patient on Foley.   ED Course: Stable vitals.  Hemoglobin 6.2.  Creatinine elevated 2.39.  K low 3.2.  Mild leukocytosis 12.5.  EKG-no significant change from prior.  UA-large hemoglobin, trace leuks rare bacteria.  Stool FOBT negative.  1 units PRBC ordered, 250 bolus normal saline given.  Hospitalist called to admit for anemia and AKI.  Hospital Course:   Acute blood loss anemia -Due to hematuria. -Hemoglobin was 6.2 on admission was transfused 1 unit of PRBCs with a hemoglobin of 8.4. -Hb has remained stable since transfusion.  Hematuria -Gross hematuria has resolved.   -CBI was discontinued on 9/18. -Per GU recommendations, will hold Eliquis for 48 hours and she has been instructed to resume in that time frame as long as no significant hematuria is present.  Acute urinary retention -With bilateral hydronephrosis on CT scan on admission. -Urology recommends keeping Foley catheter in place upon discharge. -US shows improvement in hydronephrosis, now with only mild right hydro.  Diarrhea -Has had none since admission, C. difficile and GI pathogen panel have resulted negative.    Chronic combined heart failure -Echo from  August 2018 with ejection fraction of 30 to 35% and grade 2 diastolic dysfunction. -Has been stable this hospitalization.  Type 2 diabetes, insulin-dependent -Fair control.  Chronic atrial  fibrillation -Continue amiodarone and metoprolol. -Holding Eliquis, with instructions to resume as above.  Multiple Pelvic Abscesses -Not new and unchanged per CT scan. -During prior hospitalization in August 2019 she was determined to be a high-risk surgical candidate and because of that an IR approach was undertaken. A drain was placed, she completed abx therapy. -She states she has follow up with surgery in 2 weeks. -No need for further abx as no signs of active infection.  Procedures:  None   Consultations:  Urology  Discharge Instructions  Discharge Instructions    Diet - low sodium heart healthy   Complete by:  As directed    Increase activity slowly   Complete by:  As directed    Nursing communication   Complete by:  As directed    Keep foley in on DC     Allergies as of 07/14/2018   No Known Allergies     Medication List    TAKE these medications   albuterol 108 (90 Base) MCG/ACT inhaler Commonly known as:  PROVENTIL HFA;VENTOLIN HFA INHALE 2 PUFFS EVERY 6 HOURS AS NEEDED FOR SHORTNESS OF BREATH/WHEEZING. What changed:    how much to take  how to take this  when to take this  reasons to take this  additional instructions   amiodarone 200 MG tablet Commonly known as:  PACERONE Take 1 tablet (200 mg total) by mouth 2 (two) times daily.   amLODipine 2.5 MG tablet Commonly known as:  NORVASC Take 1 tablet (2.5 mg total) by mouth daily.   apixaban 5 MG Tabs tablet Commonly known as:  ELIQUIS Take 1 tablet (5 mg total) by mouth 2 (two) times daily.   aspirin EC 81 MG tablet Take 1 tablet (81 mg total) by mouth daily.   ezetimibe 10 MG tablet Commonly known as:  ZETIA TAKE 1 TABLET BY MOUTH ONCE A DAY.   FLUoxetine 20 MG capsule Commonly known as:  PROZAC TAKE 1 CAPSULE BY MOUTH EVERY DAY. What changed:    how much to take  additional instructions   furosemide 20 MG tablet Commonly known as:  LASIX TAKE 1 TABLET BY MOUTH ONCE A DAY.    gabapentin 300 MG capsule Commonly known as:  NEURONTIN Take 1 capsule (300 mg total) by mouth 3 (three) times daily. 3 times a day when necessary neuropathy pain What changed:    when to take this  additional instructions   insulin detemir 100 UNIT/ML injection Commonly known as:  LEVEMIR Inject 0.08 mLs (8 Units total) into the skin at bedtime. What changed:  when to take this   ipratropium-albuterol 0.5-2.5 (3) MG/3ML Soln Commonly known as:  DUONEB Take 3 mLs by nebulization every 6 (six) hours as needed (wheezes or SOB).   linagliptin 5 MG Tabs tablet Commonly known as:  TRADJENTA Take 1 tablet (5 mg total) by mouth daily.   metoprolol succinate 50 MG 24 hr tablet Commonly known as:  TOPROL-XL Take 1 tablet (50 mg total) by mouth 2 (two) times daily. Take with or immediately following a meal.   montelukast 10 MG tablet Commonly known as:  SINGULAIR TAKE (1) TABLET BY MOUTH AT BEDTIME. What changed:  See the new instructions.   pantoprazole 40 MG tablet Commonly known as:  PROTONIX Take 1 tablet (40 mg total) by mouth  daily.   potassium chloride SA 20 MEQ tablet Commonly known as:  K-DUR,KLOR-CON TAKE 1&1/2 TABLET BY MOUTH DAILY. What changed:  See the new instructions.   rosuvastatin 10 MG tablet Commonly known as:  CRESTOR TAKE 1 TABLET BY MOUTH ONCE A DAY. What changed:  when to take this   SPIRIVA HANDIHALER 18 MCG inhalation capsule Generic drug:  tiotropium Place 18 mcg into inhaler and inhale daily. 2 puffs   traMADol 50 MG tablet Commonly known as:  ULTRAM Take 1 tablet (50 mg total) by mouth every 6 (six) hours as needed for severe pain.   traZODone 50 MG tablet Commonly known as:  DESYREL TAKE (1) TABLET BY MOUTH AT BEDTIME. What changed:    how much to take  how to take this  when to take this  additional instructions      No Known Allergies Follow-up Information    [name], MD. Schedule an appointment as soon as  possible for a visit in 2 week(s).   Specialty:  Family Medicine Contact information: [address] [place] [address] [phone]        Herminio Commons, MD .   Specialty:  Cardiology Contact information: [place] [address] [phone]        Cleon Gustin, MD. Schedule an appointment as soon as possible for a visit in 1 week(s).   Specialty:  Urology Contact information: [address] [place]  [address] [phone]            The results of significant diagnostics from this hospitalization (including imaging, microbiology, ancillary and laboratory) are listed below for reference.    Significant Diagnostic Studies: Ct Abdomen Pelvis Wo Contrast  Result Date: 07/12/2018 CLINICAL DATA:  Rectal bleeding and low hemoglobin. No pain. History of chronic kidney disease, congestive heart failure, COPD, hypertension, diabetes. EXAM: CT ABDOMEN AND PELVIS WITHOUT CONTRAST TECHNIQUE: Multidetector CT imaging of the abdomen and pelvis was performed following the standard protocol without IV contrast. COMPARISON:  07/05/2018 FINDINGS: Lower chest: Atelectasis or consolidation in the right lung base with small pleural effusion, improving since previous study. Hepatobiliary: No focal liver lesions identified. Probable sludge in the gallbladder. No bile duct dilatation. There is a right subdiaphragmatic fluid collection with small gas bubble. The collection measures about 2.1 x 7.1 cm in diameter. No significant change in size of the collection since previous study. A pigtail drainage catheter seen previously has been removed in the interval. Pancreas: Unremarkable. No pancreatic ductal dilatation or surrounding inflammatory changes. Spleen: Normal in size without focal abnormality. Adrenals/Urinary Tract: No adrenal gland nodules. Bilateral hydronephrosis and hydroureter with distended bladder, possibly representing reflux disease. Urine  in the bladder is somewhat increased in density which may indicate bladder hemorrhage or concentrated urine. No bladder wall thickening. No renal or ureteral stones are seen. Stomach/Bowel: Increasing size of anterior pelvic fluid collections, largest now measuring about 7.5 x 11.9 cm in diameter. This could represent abscess or hematoma. Additional smaller fluid collections also demonstrated. Bowel are compressed and displaced by the fluid collections. Stomach, small bowel, and colon are not abnormally distended. Contrast material flows through the colon to the rectum without evidence of obstruction. Pelvic small bowel suggest wall thickening which may indicate enteritis. Vascular/Lymphatic: Aortic atherosclerosis. No enlarged abdominal or pelvic lymph nodes. Reproductive: Uterus and bilateral adnexa are unremarkable. Other: Small amount of free fluid in the abdomen and pelvis. No free air. Abdominal wall musculature  appears intact. Musculoskeletal: Degenerative changes in the spine. No destructive bone lesions. IMPRESSION: 1. Right subdiaphragmatic fluid collection with small gas bubble consistent with abscess. This is unchanged in size since previous study. Drainage catheter was removed in the interval. 2. Increasing size of anterior pelvic fluid collections, largest measuring 7.5 x 11.9 cm in diameter. This could represent abscess or hematoma. 3. Bilateral hydronephrosis and hydroureter with distended bladder, possibly representing reflux disease. Increased density of urine may indicate bladder hemorrhage. Correlate with urinalysis. 4. Small amount of free fluid in the abdomen and pelvis. 5. Atelectasis or consolidation in the right lung base with small pleural effusion, improving since previous study. Aortic Atherosclerosis (ICD10-I70.0). These results will be called to the ordering clinician or representative by the Radiologist Assistant, and communication documented in the PACS or zVision Dashboard.  Electronically Signed   By: Lucienne Capers M.D.   On: 07/12/2018 01:53   Ct Abdomen Pelvis W Contrast  Result Date: 07/05/2018 CLINICAL DATA:  Abdominal abscess, post percutaneous drain catheter placement 06/15/2018 EXAM: CT ABDOMEN AND PELVIS WITH CONTRAST TECHNIQUE: Multidetector CT imaging of the abdomen and pelvis was performed using the standard protocol following bolus administration of intravenous contrast. CONTRAST:  121mL ISOVUE-300 IOPAMIDOL (ISOVUE-300) INJECTION 61% COMPARISON:  06/13/2018 FINDINGS: Lower chest: Slight improvement in small right pleural effusion. Persistent consolidation/atelectasis posteriorly in the visualized right lung base. Hepatobiliary: No focal liver abnormality is seen. No gallstones, gallbladder wall thickening, or biliary dilatation. Pancreas: Unremarkable. No pancreatic ductal dilatation or surrounding inflammatory changes. Spleen: Normal in size without focal abnormality. Adrenals/Urinary Tract: Adrenal glands are unremarkable. Kidneys are normal, without renal calculi, focal lesion, or hydronephrosis. Bladder is unremarkable. Stomach/Bowel: Stomach is incompletely distended by ingested material. Small bowel nondilated. Normal appendix. Moderate proximal colonic fecal material without dilatation. Distal colon unremarkable. Vascular/Lymphatic: Scattered aortoiliac arterial calcifications without aneurysm. Portal vein patent. No abdominal or pelvic adenopathy localized. Reproductive: Uterus and bilateral adnexa are unremarkable. Other: Right upper quadrant percutaneous drain catheter has partially withdrawn. Near complete interval resolution of the subphrenic collection seen previously. There is a residual loculated fluid collection measuring 5.8 x 2.3 cm anterior to the right lobe of the liver, medial to the drain catheter. Interval decrease in size of the interloop collection in the low peritoneal cavity, measuring 4.7 x 1.9 cm maximum transverse dimensions, previously  7.2 x 5.7 cm. The previously seen loculated cul-de-sac fluid collection has resolved. There is a small amount of ascites in the lower abdomen and pelvis, without evident loculation or peripheral enhancement. Musculoskeletal: Degenerative disc disease in the lower thoracic and lumbar spine, most severe L3-S1. No fracture or worrisome bone lesion. IMPRESSION: 1. Significant resolution of the right subphrenic collection post drain catheter placement, with a residual loculated component anterior to the right lobe of the liver, medial to the drain catheter. 2. Significant decrease in size of lower peritoneal mid loop fluid collection. 3. Interval resolution of cul-de-sac fluid collection 4. Small amount of lower abdominal and pelvic ascites. 5. Stable  right pleural effusion. Electronically Signed   By: Lucrezia Europe M.D.   On: 07/05/2018 13:56   US Renal  Result Date: 07/14/2018 CLINICAL DATA:  Patient with hydronephrosis on recent CT EXAM: RENAL / URINARY TRACT ULTRASOUND COMPLETE COMPARISON:  CT abdomen pelvis 07/12/2018 FINDINGS: Right Kidney: Length: 10.5 cm. Normal renal cortical thickness and echogenicity. Mild hydronephrosis. Small amount of fluid about the right kidney. Left Kidney: Length: 10.3 cm. Normal renal cortical thickness and echogenicity. Mild pelviectasis. Bladder: Decompressed with Foley  catheter IMPRESSION: Mild right hydronephrosis and left pelviectasis. Electronically Signed   By: Lovey Newcomer M.D.   On: 07/14/2018 12:08   Ct Image Guided Drainage By Percutaneous Catheter  Result Date: 06/15/2018 INDICATION: 52 year old female with multiple intra-abdominal fluid collections from a presumed gastrointestinal source. She presents for attempted CT-guided drain placement versus aspiration. EXAM: CT GUIDED DRAINAGE OF perihepatic ABSCESS MEDICATIONS: The patient is currently admitted to the hospital and receiving intravenous antibiotics. The antibiotics were administered within an appropriate time  frame prior to the initiation of the procedure. ANESTHESIA/SEDATION: 3 mg IV Versed 150 mcg IV Fentanyl Moderate Sedation Time:  30 minutes The patient was continuously monitored during the procedure by the interventional radiology nurse under my direct supervision. COMPLICATIONS: None immediate. TECHNIQUE: Informed written consent was obtained from the patient after a thorough discussion of the procedural risks, benefits and alternatives. All questions were addressed. Maximal Sterile Barrier Technique was utilized including caps, mask, sterile gowns, sterile gloves, sterile drape, hand hygiene and skin antiseptic. A timeout was performed prior to the initiation of the procedure. PROCEDURE: The operative field was prepped with chlorhexidine in a sterile fashion, and a sterile drape was applied covering the operative field. A sterile gown and sterile gloves were used for the procedure. Local anesthesia was provided with 1% Lidocaine. During the procedure it was noted that the patient uses accessory abdominal muscles during breathing. Her stomach heaves with every breath. This makes localization of her intra-abdominal fluid collection somewhat challenging. On the initial imaging, there was a questionable very small window to the mesenteric fluid collection. Local anesthesia was attained by infiltration with 1% lidocaine a small dermatotomy was made. An attempt was made to guide an 18 gauge trocar needle from the skin surface into this fluid collection, however during intermittent CT imaging, it was evident that there was significant motion of the overlying small bowel resulting in only in intermittent window. That combined with the significant heaving of the abdominal wall resulted in a combination of factors that was too dangerous to proceed. This portion of the procedure was aborted. Attention was turned to the right upper quadrant perihepatic fluid collection. Repeat CT imaging was performed. A suitable skin entry  site was identified and marked. The skin was again prepped and draped in standard fashion using chlorhexidine skin prep. Local anesthesia was attained by infiltration with 1% lidocaine. A small dermatotomy was made. An 18 gauge trocar needle was advanced into the fluid collection under intermittent CT guidance. Once within the fluid collection, a 0.035 wire was advanced in the fluid collection, the needle was removed and the skin tract was dilated to 12 [place]. A Cook 12 [place] all-purpose drainage catheter was then modified with additional sideholes, advanced over the wire and formed. Aspiration yields approximately 25 mL of turbid serosanguineous ascitic fluid. Post drain placement CT imaging demonstrates a well-positioned drainage catheter lateral and posterior to the right hepatic lobe within the fluid collection. The drain was connected to JP bulb suction and secured to the skin with 0 Prolene suture and an adhesive fixation device. FINDINGS: Approximately 25 mL turbid ascitic fluid aspirated from the right perihepatic fluid collection. The intra mesenteric fluid collection is not amenable to percutaneous aspiration or drainage at this time. IMPRESSION: 1. Successful placement of a 12 French drainage catheter in the right upper quadrant perihepatic fluid collection with aspiration of turbid ascitic fluid. A sample was sent for Gram stain and culture. 2. Unsuccessful attempted aspiration/drain placement of the mesenteric fluid collection. There  is no safe window to proceed. Signed, Criselda Peaches, MD [facility] Electronically Signed   By: Jacqulynn Cadet M.D.   On: 06/15/2018 17:22   Ir Radiologist Eval & Mgmt  Result Date: 07/05/2018 Please refer to notes tab for details about interventional procedure. (Op Note)   Microbiology: Recent Results (from the past 240 hour(s))  Urine culture     Status: None   Collection Time: 07/11/18  4:09  PM  Result Value Ref Range Status   Specimen Description   Final    URINE, CATHETERIZED Performed at [facility], [address], [place], [place] [address]    Special Requests   Final    Normal Performed at [facility], [address], [place], [place] [address]    Culture   Final    NO GROWTH Performed at [facility], [place] [address] [place], [place] [address]    Report Status 07/13/2018 FINAL  Final  MRSA PCR Screening     Status: None   Collection Time: 07/11/18 11:57 PM  Result Value Ref Range Status   MRSA by PCR NEGATIVE NEGATIVE Final    Comment:        The GeneXpert MRSA Assay (FDA approved for NASAL specimens only), is one component of a comprehensive MRSA colonization surveillance program. It is not intended to diagnose MRSA infection nor to guide or monitor treatment for MRSA infections. Performed at [facility], [address] [place], [place] [address]   Culture, blood (Routine X 2) w Reflex to ID Panel     Status: None (Preliminary result)   Collection Time: 07/12/18  1:11 PM  Result Value Ref Range Status   Specimen Description BLOOD LEFT ANTECUBITAL  Final   Special Requests   Final    BOTTLES DRAWN AEROBIC AND ANAEROBIC Blood Culture adequate volume   Culture   Final    NO GROWTH 2 DAYS Performed at [facility], [address], [place], [place] [address]    Report Status PENDING  Incomplete  Culture, blood (Routine X 2) w Reflex to ID Panel     Status: None (Preliminary result)   Collection Time: 07/12/18  1:13 PM  Result Value Ref Range Status   Specimen Description BLOOD LEFT WRIST  Final   Special Requests   Final    BOTTLES DRAWN AEROBIC AND ANAEROBIC Blood Culture adequate volume   Culture   Final    NO GROWTH 2 DAYS Performed at [facility], [address] [place], [place] [address]    Report Status PENDING  Incomplete  C difficile quick scan w PCR reflex     Status: None   Collection Time: 07/12/18  1:14 PM  Result Value Ref  Range Status   C Diff antigen NEGATIVE NEGATIVE Final   C Diff toxin NEGATIVE NEGATIVE Final   C Diff interpretation No C. difficile detected.  Final    Comment: Performed at [facility], [address] [place], [place] [address]  Gastrointestinal Panel by PCR , Stool     Status: None   Collection Time: 07/12/18  1:14 PM  Result Value Ref Range Status   Campylobacter species NOT DETECTED NOT DETECTED Final   Plesimonas shigelloides NOT DETECTED NOT DETECTED Final   Salmonella species NOT DETECTED NOT DETECTED Final   Yersinia enterocolitica NOT DETECTED NOT DETECTED Final   Vibrio species NOT DETECTED NOT DETECTED Final   Vibrio cholerae NOT DETECTED NOT DETECTED Final  Enteroaggregative E coli (EAEC) NOT DETECTED NOT DETECTED Final   Enteropathogenic E coli (EPEC) NOT DETECTED NOT DETECTED Final   Enterotoxigenic E coli (ETEC) NOT DETECTED NOT DETECTED Final   Shiga like toxin producing E coli (STEC) NOT DETECTED NOT DETECTED Final   Shigella/Enteroinvasive E coli (EIEC) NOT DETECTED NOT DETECTED Final   Cryptosporidium NOT DETECTED NOT DETECTED Final   Cyclospora cayetanensis NOT DETECTED NOT DETECTED Final   Entamoeba histolytica NOT DETECTED NOT DETECTED Final   Giardia lamblia NOT DETECTED NOT DETECTED Final   Adenovirus F40/41 NOT DETECTED NOT DETECTED Final   Astrovirus NOT DETECTED NOT DETECTED Final   Norovirus GI/GII NOT DETECTED NOT DETECTED Final   Rotavirus A NOT DETECTED NOT DETECTED Final   Sapovirus (I, II, IV, and V) NOT DETECTED NOT DETECTED Final    Comment: Performed at [facility], [address] [place], [place] [address]     Labs: Basic Metabolic Panel: Recent Labs  Lab 07/11/18 1550 07/12/18 0816 07/13/18 1030  NA 138 140 144  K 3.2* 3.2* 3.1*  CL 105 108 113*  CO2 28 23 25   GLUCOSE 193* 110* 144*  BUN 39* 37* 25*  CREATININE 2.39* 2.20* 1.38*  CALCIUM 8.2* 8.4* 8.5*  MG 1.7  --  1.6*  PHOS  --   --  3.1   Liver Function  Tests: Recent Labs  Lab 07/11/18 1550 07/13/18 1030  AST 10*  --   ALT 7  --   ALKPHOS 72  --   BILITOT 0.5  --   PROT 7.4  --   ALBUMIN 2.4* 2.0*   Recent Labs  Lab 07/11/18 1550  LIPASE 21   No results for input(s): AMMONIA in the last 168 hours. CBC: Recent Labs  Lab 07/11/18 1550 07/12/18 0816 07/13/18 1030 07/14/18 0449  WBC 12.5* 10.6* 9.1 9.7  NEUTROABS 9.1*  --  7.1  --   HGB 6.2* 8.4* 8.6* 8.4*  HCT 21.7* 26.9* 28.5* 27.3*  MCV 96.0 93.1 93.8 93.8  PLT 397 316 348 364   Cardiac Enzymes: No results for input(s): CKTOTAL, CKMB, CKMBINDEX, TROPONINI in the last 168 hours. BNP: BNP (last 3 results) No results for input(s): BNP in the last 8760 hours.  ProBNP (last 3 results) No results for input(s): PROBNP in the last 8760 hours.  CBG: Recent Labs  Lab 07/13/18 0905 07/13/18 1149 07/13/18 2128 07/14/18 0802 07/14/18 1141  GLUCAP 162* 132* 121* 116* 135*       Signed:  Lelon Frohlich  [facility] Pager: [phone] 07/14/2018, 1:39 PM

## 2018-07-14 NOTE — Progress Notes (Signed)
Upon morning assessment, pt declined morning medications and stated "she wanted to wait until later." RN will check with patient later and see if she has changed her mind.

## 2018-07-14 NOTE — Discharge Instructions (Signed)
Hold Eliquis until Monday. If urine clear, resume your Eliquis. If urine is bloody, call Urology office for further recommendations.

## 2018-07-14 NOTE — Progress Notes (Signed)
IV removed, WNL. D/C instructions given to pt. Verbalized understanding. EMS to transport patient home. Pt to have home health services when discharged.

## 2018-07-17 DIAGNOSIS — Z7982 Long term (current) use of aspirin: Secondary | ICD-10-CM | POA: Diagnosis not present

## 2018-07-17 DIAGNOSIS — Z466 Encounter for fitting and adjustment of urinary device: Secondary | ICD-10-CM | POA: Diagnosis not present

## 2018-07-17 DIAGNOSIS — I429 Cardiomyopathy, unspecified: Secondary | ICD-10-CM | POA: Diagnosis not present

## 2018-07-17 DIAGNOSIS — G4730 Sleep apnea, unspecified: Secondary | ICD-10-CM | POA: Diagnosis not present

## 2018-07-17 DIAGNOSIS — Z794 Long term (current) use of insulin: Secondary | ICD-10-CM | POA: Diagnosis not present

## 2018-07-17 DIAGNOSIS — N183 Chronic kidney disease, stage 3 (moderate): Secondary | ICD-10-CM | POA: Diagnosis not present

## 2018-07-17 DIAGNOSIS — E1140 Type 2 diabetes mellitus with diabetic neuropathy, unspecified: Secondary | ICD-10-CM | POA: Diagnosis not present

## 2018-07-17 DIAGNOSIS — I700 Atherosclerosis of aorta: Secondary | ICD-10-CM | POA: Diagnosis not present

## 2018-07-17 DIAGNOSIS — E1122 Type 2 diabetes mellitus with diabetic chronic kidney disease: Secondary | ICD-10-CM | POA: Diagnosis not present

## 2018-07-17 DIAGNOSIS — I130 Hypertensive heart and chronic kidney disease with heart failure and stage 1 through stage 4 chronic kidney disease, or unspecified chronic kidney disease: Secondary | ICD-10-CM | POA: Diagnosis not present

## 2018-07-17 DIAGNOSIS — Z853 Personal history of malignant neoplasm of breast: Secondary | ICD-10-CM | POA: Diagnosis not present

## 2018-07-17 DIAGNOSIS — I482 Chronic atrial fibrillation, unspecified: Secondary | ICD-10-CM | POA: Diagnosis not present

## 2018-07-17 DIAGNOSIS — Z8673 Personal history of transient ischemic attack (TIA), and cerebral infarction without residual deficits: Secondary | ICD-10-CM | POA: Diagnosis not present

## 2018-07-17 DIAGNOSIS — Z87440 Personal history of urinary (tract) infections: Secondary | ICD-10-CM | POA: Diagnosis not present

## 2018-07-17 DIAGNOSIS — I5042 Chronic combined systolic (congestive) and diastolic (congestive) heart failure: Secondary | ICD-10-CM | POA: Diagnosis not present

## 2018-07-17 DIAGNOSIS — E785 Hyperlipidemia, unspecified: Secondary | ICD-10-CM | POA: Diagnosis not present

## 2018-07-17 DIAGNOSIS — J449 Chronic obstructive pulmonary disease, unspecified: Secondary | ICD-10-CM | POA: Diagnosis not present

## 2018-07-17 DIAGNOSIS — R339 Retention of urine, unspecified: Secondary | ICD-10-CM | POA: Diagnosis not present

## 2018-07-17 DIAGNOSIS — E11610 Type 2 diabetes mellitus with diabetic neuropathic arthropathy: Secondary | ICD-10-CM | POA: Diagnosis not present

## 2018-07-17 LAB — CULTURE, BLOOD (ROUTINE X 2)
Culture: NO GROWTH
Culture: NO GROWTH
Special Requests: ADEQUATE
Special Requests: ADEQUATE

## 2018-07-18 ENCOUNTER — Other Ambulatory Visit: Payer: Self-pay | Admitting: Family Medicine

## 2018-07-18 ENCOUNTER — Telehealth: Payer: Self-pay

## 2018-07-18 NOTE — Telephone Encounter (Signed)
Transition Care Management Follow-up Telephone Call   Date discharged?      9/20          How have you been since you were released from the hospital? so so . Just stressed because I have a catheter in and I'm in a wheel chair.   Do you understand why you were in the hospital? I couldn't use the bathroom and I had a leakage in my stomach    Do you understand the discharge instructions? yes   Where were you discharged to? home   Items Reviewed:  Medications reviewed: yes  Allergies reviewed: yes  Dietary changes reviewed: yes  Referrals reviewed: yes   Functional Questionnaire:   Activities of Daily Living (ADLs):  Needs assistance. Would like for Dr.Simpson to help    Any transportation issues/concerns?:    Any patient concerns? Unable to do ADL's. Needs assistance.     Confirmed importance and date/time of follow-up visits scheduled yes. Jul 26, 2018 @ 2:20pm      Confirmed with patient if condition begins to worsen call PCP or go to the ER.  Patient was given the office number and encouraged to call back with question or concerns. Yes, with verbal understanding.

## 2018-07-19 ENCOUNTER — Telehealth: Payer: Self-pay | Admitting: Family Medicine

## 2018-07-19 DIAGNOSIS — N183 Chronic kidney disease, stage 3 (moderate): Secondary | ICD-10-CM | POA: Diagnosis not present

## 2018-07-19 DIAGNOSIS — E785 Hyperlipidemia, unspecified: Secondary | ICD-10-CM | POA: Diagnosis not present

## 2018-07-19 DIAGNOSIS — Z466 Encounter for fitting and adjustment of urinary device: Secondary | ICD-10-CM | POA: Diagnosis not present

## 2018-07-19 DIAGNOSIS — E1140 Type 2 diabetes mellitus with diabetic neuropathy, unspecified: Secondary | ICD-10-CM | POA: Diagnosis not present

## 2018-07-19 DIAGNOSIS — J449 Chronic obstructive pulmonary disease, unspecified: Secondary | ICD-10-CM | POA: Diagnosis not present

## 2018-07-19 DIAGNOSIS — E11610 Type 2 diabetes mellitus with diabetic neuropathic arthropathy: Secondary | ICD-10-CM | POA: Diagnosis not present

## 2018-07-19 DIAGNOSIS — E1122 Type 2 diabetes mellitus with diabetic chronic kidney disease: Secondary | ICD-10-CM | POA: Diagnosis not present

## 2018-07-19 DIAGNOSIS — Z7982 Long term (current) use of aspirin: Secondary | ICD-10-CM | POA: Diagnosis not present

## 2018-07-19 DIAGNOSIS — I482 Chronic atrial fibrillation, unspecified: Secondary | ICD-10-CM | POA: Diagnosis not present

## 2018-07-19 DIAGNOSIS — I130 Hypertensive heart and chronic kidney disease with heart failure and stage 1 through stage 4 chronic kidney disease, or unspecified chronic kidney disease: Secondary | ICD-10-CM | POA: Diagnosis not present

## 2018-07-19 DIAGNOSIS — I429 Cardiomyopathy, unspecified: Secondary | ICD-10-CM | POA: Diagnosis not present

## 2018-07-19 DIAGNOSIS — Z853 Personal history of malignant neoplasm of breast: Secondary | ICD-10-CM | POA: Diagnosis not present

## 2018-07-19 DIAGNOSIS — G4730 Sleep apnea, unspecified: Secondary | ICD-10-CM | POA: Diagnosis not present

## 2018-07-19 DIAGNOSIS — I5042 Chronic combined systolic (congestive) and diastolic (congestive) heart failure: Secondary | ICD-10-CM | POA: Diagnosis not present

## 2018-07-19 DIAGNOSIS — R339 Retention of urine, unspecified: Secondary | ICD-10-CM | POA: Diagnosis not present

## 2018-07-19 DIAGNOSIS — Z794 Long term (current) use of insulin: Secondary | ICD-10-CM | POA: Diagnosis not present

## 2018-07-19 DIAGNOSIS — I700 Atherosclerosis of aorta: Secondary | ICD-10-CM | POA: Diagnosis not present

## 2018-07-19 DIAGNOSIS — Z87440 Personal history of urinary (tract) infections: Secondary | ICD-10-CM | POA: Diagnosis not present

## 2018-07-19 DIAGNOSIS — Z8673 Personal history of transient ischemic attack (TIA), and cerebral infarction without residual deficits: Secondary | ICD-10-CM | POA: Diagnosis not present

## 2018-07-19 NOTE — Telephone Encounter (Signed)
Estill Bamberg from [facility] left voicemail requesting verbal for nursing & homehealth orders for 8 weeks, and occupation therapy eval. Cb#: [phone]/ [phone]

## 2018-07-19 NOTE — Telephone Encounter (Signed)
Left generic message requesting call back.  

## 2018-07-20 NOTE — Telephone Encounter (Signed)
Gave verbal order

## 2018-07-21 DIAGNOSIS — E1122 Type 2 diabetes mellitus with diabetic chronic kidney disease: Secondary | ICD-10-CM | POA: Diagnosis not present

## 2018-07-21 DIAGNOSIS — Z7982 Long term (current) use of aspirin: Secondary | ICD-10-CM | POA: Diagnosis not present

## 2018-07-21 DIAGNOSIS — Z466 Encounter for fitting and adjustment of urinary device: Secondary | ICD-10-CM | POA: Diagnosis not present

## 2018-07-21 DIAGNOSIS — Z794 Long term (current) use of insulin: Secondary | ICD-10-CM | POA: Diagnosis not present

## 2018-07-21 DIAGNOSIS — J449 Chronic obstructive pulmonary disease, unspecified: Secondary | ICD-10-CM | POA: Diagnosis not present

## 2018-07-21 DIAGNOSIS — G4730 Sleep apnea, unspecified: Secondary | ICD-10-CM | POA: Diagnosis not present

## 2018-07-21 DIAGNOSIS — E1140 Type 2 diabetes mellitus with diabetic neuropathy, unspecified: Secondary | ICD-10-CM | POA: Diagnosis not present

## 2018-07-21 DIAGNOSIS — Z8673 Personal history of transient ischemic attack (TIA), and cerebral infarction without residual deficits: Secondary | ICD-10-CM | POA: Diagnosis not present

## 2018-07-21 DIAGNOSIS — I130 Hypertensive heart and chronic kidney disease with heart failure and stage 1 through stage 4 chronic kidney disease, or unspecified chronic kidney disease: Secondary | ICD-10-CM | POA: Diagnosis not present

## 2018-07-21 DIAGNOSIS — I482 Chronic atrial fibrillation, unspecified: Secondary | ICD-10-CM | POA: Diagnosis not present

## 2018-07-21 DIAGNOSIS — E11610 Type 2 diabetes mellitus with diabetic neuropathic arthropathy: Secondary | ICD-10-CM | POA: Diagnosis not present

## 2018-07-21 DIAGNOSIS — I700 Atherosclerosis of aorta: Secondary | ICD-10-CM | POA: Diagnosis not present

## 2018-07-21 DIAGNOSIS — N183 Chronic kidney disease, stage 3 (moderate): Secondary | ICD-10-CM | POA: Diagnosis not present

## 2018-07-21 DIAGNOSIS — E785 Hyperlipidemia, unspecified: Secondary | ICD-10-CM | POA: Diagnosis not present

## 2018-07-21 DIAGNOSIS — Z853 Personal history of malignant neoplasm of breast: Secondary | ICD-10-CM | POA: Diagnosis not present

## 2018-07-21 DIAGNOSIS — Z87440 Personal history of urinary (tract) infections: Secondary | ICD-10-CM | POA: Diagnosis not present

## 2018-07-21 DIAGNOSIS — I429 Cardiomyopathy, unspecified: Secondary | ICD-10-CM | POA: Diagnosis not present

## 2018-07-21 DIAGNOSIS — I5042 Chronic combined systolic (congestive) and diastolic (congestive) heart failure: Secondary | ICD-10-CM | POA: Diagnosis not present

## 2018-07-21 DIAGNOSIS — R339 Retention of urine, unspecified: Secondary | ICD-10-CM | POA: Diagnosis not present

## 2018-07-22 DIAGNOSIS — R339 Retention of urine, unspecified: Secondary | ICD-10-CM | POA: Diagnosis not present

## 2018-07-22 DIAGNOSIS — E1140 Type 2 diabetes mellitus with diabetic neuropathy, unspecified: Secondary | ICD-10-CM | POA: Diagnosis not present

## 2018-07-22 DIAGNOSIS — I429 Cardiomyopathy, unspecified: Secondary | ICD-10-CM | POA: Diagnosis not present

## 2018-07-22 DIAGNOSIS — Z466 Encounter for fitting and adjustment of urinary device: Secondary | ICD-10-CM | POA: Diagnosis not present

## 2018-07-22 DIAGNOSIS — Z7982 Long term (current) use of aspirin: Secondary | ICD-10-CM | POA: Diagnosis not present

## 2018-07-22 DIAGNOSIS — Z853 Personal history of malignant neoplasm of breast: Secondary | ICD-10-CM | POA: Diagnosis not present

## 2018-07-22 DIAGNOSIS — G4730 Sleep apnea, unspecified: Secondary | ICD-10-CM | POA: Diagnosis not present

## 2018-07-22 DIAGNOSIS — I700 Atherosclerosis of aorta: Secondary | ICD-10-CM | POA: Diagnosis not present

## 2018-07-22 DIAGNOSIS — E785 Hyperlipidemia, unspecified: Secondary | ICD-10-CM | POA: Diagnosis not present

## 2018-07-22 DIAGNOSIS — J449 Chronic obstructive pulmonary disease, unspecified: Secondary | ICD-10-CM | POA: Diagnosis not present

## 2018-07-22 DIAGNOSIS — I5042 Chronic combined systolic (congestive) and diastolic (congestive) heart failure: Secondary | ICD-10-CM | POA: Diagnosis not present

## 2018-07-22 DIAGNOSIS — Z8673 Personal history of transient ischemic attack (TIA), and cerebral infarction without residual deficits: Secondary | ICD-10-CM | POA: Diagnosis not present

## 2018-07-22 DIAGNOSIS — Z794 Long term (current) use of insulin: Secondary | ICD-10-CM | POA: Diagnosis not present

## 2018-07-22 DIAGNOSIS — Z87440 Personal history of urinary (tract) infections: Secondary | ICD-10-CM | POA: Diagnosis not present

## 2018-07-22 DIAGNOSIS — I482 Chronic atrial fibrillation, unspecified: Secondary | ICD-10-CM | POA: Diagnosis not present

## 2018-07-22 DIAGNOSIS — E1122 Type 2 diabetes mellitus with diabetic chronic kidney disease: Secondary | ICD-10-CM | POA: Diagnosis not present

## 2018-07-22 DIAGNOSIS — I130 Hypertensive heart and chronic kidney disease with heart failure and stage 1 through stage 4 chronic kidney disease, or unspecified chronic kidney disease: Secondary | ICD-10-CM | POA: Diagnosis not present

## 2018-07-22 DIAGNOSIS — N183 Chronic kidney disease, stage 3 (moderate): Secondary | ICD-10-CM | POA: Diagnosis not present

## 2018-07-22 DIAGNOSIS — E11610 Type 2 diabetes mellitus with diabetic neuropathic arthropathy: Secondary | ICD-10-CM | POA: Diagnosis not present

## 2018-07-23 DIAGNOSIS — N183 Chronic kidney disease, stage 3 (moderate): Secondary | ICD-10-CM | POA: Diagnosis not present

## 2018-07-23 DIAGNOSIS — Z87440 Personal history of urinary (tract) infections: Secondary | ICD-10-CM | POA: Diagnosis not present

## 2018-07-23 DIAGNOSIS — Z8673 Personal history of transient ischemic attack (TIA), and cerebral infarction without residual deficits: Secondary | ICD-10-CM | POA: Diagnosis not present

## 2018-07-23 DIAGNOSIS — E785 Hyperlipidemia, unspecified: Secondary | ICD-10-CM | POA: Diagnosis not present

## 2018-07-23 DIAGNOSIS — R339 Retention of urine, unspecified: Secondary | ICD-10-CM | POA: Diagnosis not present

## 2018-07-23 DIAGNOSIS — I700 Atherosclerosis of aorta: Secondary | ICD-10-CM | POA: Diagnosis not present

## 2018-07-23 DIAGNOSIS — J449 Chronic obstructive pulmonary disease, unspecified: Secondary | ICD-10-CM | POA: Diagnosis not present

## 2018-07-23 DIAGNOSIS — I429 Cardiomyopathy, unspecified: Secondary | ICD-10-CM | POA: Diagnosis not present

## 2018-07-23 DIAGNOSIS — G4730 Sleep apnea, unspecified: Secondary | ICD-10-CM | POA: Diagnosis not present

## 2018-07-23 DIAGNOSIS — I482 Chronic atrial fibrillation, unspecified: Secondary | ICD-10-CM | POA: Diagnosis not present

## 2018-07-23 DIAGNOSIS — Z853 Personal history of malignant neoplasm of breast: Secondary | ICD-10-CM | POA: Diagnosis not present

## 2018-07-23 DIAGNOSIS — Z794 Long term (current) use of insulin: Secondary | ICD-10-CM | POA: Diagnosis not present

## 2018-07-23 DIAGNOSIS — E1122 Type 2 diabetes mellitus with diabetic chronic kidney disease: Secondary | ICD-10-CM | POA: Diagnosis not present

## 2018-07-23 DIAGNOSIS — I130 Hypertensive heart and chronic kidney disease with heart failure and stage 1 through stage 4 chronic kidney disease, or unspecified chronic kidney disease: Secondary | ICD-10-CM | POA: Diagnosis not present

## 2018-07-23 DIAGNOSIS — Z466 Encounter for fitting and adjustment of urinary device: Secondary | ICD-10-CM | POA: Diagnosis not present

## 2018-07-23 DIAGNOSIS — E11610 Type 2 diabetes mellitus with diabetic neuropathic arthropathy: Secondary | ICD-10-CM | POA: Diagnosis not present

## 2018-07-23 DIAGNOSIS — E1140 Type 2 diabetes mellitus with diabetic neuropathy, unspecified: Secondary | ICD-10-CM | POA: Diagnosis not present

## 2018-07-23 DIAGNOSIS — I5042 Chronic combined systolic (congestive) and diastolic (congestive) heart failure: Secondary | ICD-10-CM | POA: Diagnosis not present

## 2018-07-23 DIAGNOSIS — Z7982 Long term (current) use of aspirin: Secondary | ICD-10-CM | POA: Diagnosis not present

## 2018-07-25 DIAGNOSIS — E785 Hyperlipidemia, unspecified: Secondary | ICD-10-CM | POA: Diagnosis not present

## 2018-07-25 DIAGNOSIS — Z7982 Long term (current) use of aspirin: Secondary | ICD-10-CM | POA: Diagnosis not present

## 2018-07-25 DIAGNOSIS — E11610 Type 2 diabetes mellitus with diabetic neuropathic arthropathy: Secondary | ICD-10-CM | POA: Diagnosis not present

## 2018-07-25 DIAGNOSIS — I5042 Chronic combined systolic (congestive) and diastolic (congestive) heart failure: Secondary | ICD-10-CM | POA: Diagnosis not present

## 2018-07-25 DIAGNOSIS — Z87440 Personal history of urinary (tract) infections: Secondary | ICD-10-CM | POA: Diagnosis not present

## 2018-07-25 DIAGNOSIS — G4730 Sleep apnea, unspecified: Secondary | ICD-10-CM | POA: Diagnosis not present

## 2018-07-25 DIAGNOSIS — I700 Atherosclerosis of aorta: Secondary | ICD-10-CM | POA: Diagnosis not present

## 2018-07-25 DIAGNOSIS — E1140 Type 2 diabetes mellitus with diabetic neuropathy, unspecified: Secondary | ICD-10-CM | POA: Diagnosis not present

## 2018-07-25 DIAGNOSIS — R339 Retention of urine, unspecified: Secondary | ICD-10-CM | POA: Diagnosis not present

## 2018-07-25 DIAGNOSIS — Z466 Encounter for fitting and adjustment of urinary device: Secondary | ICD-10-CM | POA: Diagnosis not present

## 2018-07-25 DIAGNOSIS — J449 Chronic obstructive pulmonary disease, unspecified: Secondary | ICD-10-CM | POA: Diagnosis not present

## 2018-07-25 DIAGNOSIS — E1122 Type 2 diabetes mellitus with diabetic chronic kidney disease: Secondary | ICD-10-CM | POA: Diagnosis not present

## 2018-07-25 DIAGNOSIS — I130 Hypertensive heart and chronic kidney disease with heart failure and stage 1 through stage 4 chronic kidney disease, or unspecified chronic kidney disease: Secondary | ICD-10-CM | POA: Diagnosis not present

## 2018-07-25 DIAGNOSIS — N183 Chronic kidney disease, stage 3 (moderate): Secondary | ICD-10-CM | POA: Diagnosis not present

## 2018-07-25 DIAGNOSIS — Z853 Personal history of malignant neoplasm of breast: Secondary | ICD-10-CM | POA: Diagnosis not present

## 2018-07-25 DIAGNOSIS — Z794 Long term (current) use of insulin: Secondary | ICD-10-CM | POA: Diagnosis not present

## 2018-07-25 DIAGNOSIS — Z8673 Personal history of transient ischemic attack (TIA), and cerebral infarction without residual deficits: Secondary | ICD-10-CM | POA: Diagnosis not present

## 2018-07-25 DIAGNOSIS — I482 Chronic atrial fibrillation, unspecified: Secondary | ICD-10-CM | POA: Diagnosis not present

## 2018-07-25 DIAGNOSIS — I429 Cardiomyopathy, unspecified: Secondary | ICD-10-CM | POA: Diagnosis not present

## 2018-07-26 ENCOUNTER — Ambulatory Visit (INDEPENDENT_AMBULATORY_CARE_PROVIDER_SITE_OTHER): Payer: Medicare Other | Admitting: Urology

## 2018-07-26 ENCOUNTER — Ambulatory Visit (INDEPENDENT_AMBULATORY_CARE_PROVIDER_SITE_OTHER): Payer: Medicare Other | Admitting: Family Medicine

## 2018-07-26 ENCOUNTER — Encounter: Payer: Self-pay | Admitting: Family Medicine

## 2018-07-26 VITALS — BP 180/98 | HR 75 | Resp 16 | Ht 61.0 in | Wt 159.0 lb

## 2018-07-26 DIAGNOSIS — Z09 Encounter for follow-up examination after completed treatment for conditions other than malignant neoplasm: Secondary | ICD-10-CM

## 2018-07-26 DIAGNOSIS — R130 Aphagia: Secondary | ICD-10-CM | POA: Diagnosis not present

## 2018-07-26 DIAGNOSIS — R338 Other retention of urine: Secondary | ICD-10-CM | POA: Diagnosis not present

## 2018-07-26 DIAGNOSIS — E1165 Type 2 diabetes mellitus with hyperglycemia: Secondary | ICD-10-CM

## 2018-07-26 DIAGNOSIS — E11621 Type 2 diabetes mellitus with foot ulcer: Secondary | ICD-10-CM | POA: Diagnosis not present

## 2018-07-26 DIAGNOSIS — I10 Essential (primary) hypertension: Secondary | ICD-10-CM

## 2018-07-26 DIAGNOSIS — E1122 Type 2 diabetes mellitus with diabetic chronic kidney disease: Secondary | ICD-10-CM

## 2018-07-26 DIAGNOSIS — L97509 Non-pressure chronic ulcer of other part of unspecified foot with unspecified severity: Secondary | ICD-10-CM

## 2018-07-26 DIAGNOSIS — F332 Major depressive disorder, recurrent severe without psychotic features: Secondary | ICD-10-CM

## 2018-07-26 DIAGNOSIS — N183 Chronic kidney disease, stage 3 unspecified: Secondary | ICD-10-CM

## 2018-07-26 DIAGNOSIS — Z23 Encounter for immunization: Secondary | ICD-10-CM

## 2018-07-26 DIAGNOSIS — E1129 Type 2 diabetes mellitus with other diabetic kidney complication: Secondary | ICD-10-CM

## 2018-07-26 DIAGNOSIS — E1152 Type 2 diabetes mellitus with diabetic peripheral angiopathy with gangrene: Secondary | ICD-10-CM

## 2018-07-26 DIAGNOSIS — IMO0002 Reserved for concepts with insufficient information to code with codable children: Secondary | ICD-10-CM

## 2018-07-26 MED ORDER — AMLODIPINE BESYLATE 5 MG PO TABS: 5.0000 mg | ORAL_TABLET | Freq: Every day | ORAL | 3 refills | Status: DC

## 2018-07-26 MED ORDER — FLUOXETINE HCL 40 MG PO CAPS: 40.0000 mg | ORAL_CAPSULE | Freq: Every day | ORAL | 3 refills | Status: DC

## 2018-07-26 NOTE — Progress Notes (Signed)
   KAMILLE TOOMEY     MRN: [id number]      DOB: 26-Jun-1966   HPI Ms. Djordjevic is here for follow up from recent discharge from SNF ( [facility]) where she  Was since 9/19 until 07/21/2018 ROS Denies recent fever or chills. Denies sinus pressure, nasal congestion, ear pain or sore throat. Denies chest congestion, productive cough or wheezing. Denies chest pains, palpitations and leg swelling Denies abdominal pain, nausea, vomiting,diarrhea or constipation.   Denies dysuria, frequency, hesitancy or incontinence. C/o chronic  joint pain,  and limitation in mobility. Denies headaches, seizures, numbness, or tingling. C/o depression, anxiety or insomnia. Denies skin break down or rash.   PE  BP (!) 180/98   Pulse 75   Resp 16   Ht 5\' 1"  (1.549 m)   Wt 159 lb (72.1 kg)   LMP 05/19/2016   SpO2 100%   BMI 30.04 kg/m   Patient alert and oriented and in no cardiopulmonary distress.  HEENT: No facial asymmetry, EOMI,   oropharynx pink and moist.  Neck supple no JVD, no mass.  Chest: Clear to auscultation bilaterally.  CVS: S1, S2 no murmurs, no S3.Regular rate.  ABD: Soft non tender.   Ext: No edema  MS: Adequate though reduced  ROM spine, shoulders, hips and knees.  Skin: Intact, onychomycosis and tinea pedis bilaterally.  Psych: Good eye contact, normal affect. Memory intact n depressed appearing.  CNS: CN 2-12 intact, power,  normal throughout.no focal deficits noted.   Assessment & Plan  HTN (hypertension), malignant Uncontrolled , dose increase in medciation and f/u in 6 to 8 weeks DASH diet and commitment to daily physical activity for a minimum of 30 minutes discussed and encouraged, as a part of hypertension management. The importance of attaining a healthy weight is also discussed.  BP/Weight 07/26/2018 07/14/2018 07/11/2018 06/20/2018 06/15/2018 4/96/7591 03/28/8465  Systolic BP 599 357 - 017 793 - 903  Diastolic BP 98 009 - 88 96 - 80  Wt. (Lbs) 159 - 169 - - 165.79  152.12  BMI 30.04 - 31.93 - - 31.32 28.74  Some encounter information is confidential and restricted. Go to Review Flowsheets activity to see all data.       Severe recurrent major depression without psychotic features ([place]) PHQ 9 score of 21 on 07/26/2018, not suicidal or homicidal Increase fluoxetine to 40 mg and refer to psychiatry  Hospital discharge follow-up Hospital and Nursing home records reviewed with patient Blood pressure is el;evated and medication adjusted.\ Updated hBA1C is normal and her kidney function is impaired.cBC is normal  Need for immunization against influenza After obtaining informed consent, the vaccine is  administered by LPN.

## 2018-07-26 NOTE — Patient Instructions (Addendum)
F/U first week in Novemeber, call if you need me before.  Flu vaccine today  Foot exam today and you need to see Podiatrist for nail care and shoes  HBA1C, cmp and EGFr and CBC today at [facility]  Blood pressure is high, take an additional amlodipine tablet 5 mg one daily  ( total 7.5 mg daily)   Depression medication fluoxetine is increased to 40 mg and you are referred to Psychiatry next door      

## 2018-07-27 ENCOUNTER — Other Ambulatory Visit: Payer: Self-pay | Admitting: Family Medicine

## 2018-07-27 LAB — COMPLETE METABOLIC PANEL WITH GFR
AG Ratio: 0.9 (calc) — ABNORMAL LOW (ref 1.0–2.5)
ALT: 7 U/L (ref 6–29)
AST: 13 U/L (ref 10–35)
Albumin: 3.9 g/dL (ref 3.6–5.1)
Alkaline phosphatase (APISO): 113 U/L (ref 33–130)
BUN/Creatinine Ratio: 11 (calc) (ref 6–22)
BUN: 17 mg/dL (ref 7–25)
CO2: 25 mmol/L (ref 20–32)
Calcium: 10 mg/dL (ref 8.6–10.4)
Chloride: 101 mmol/L (ref 98–110)
Creat: 1.56 mg/dL — ABNORMAL HIGH (ref 0.50–1.05)
GFR, Est African American: 43 mL/min/{1.73_m2} — ABNORMAL LOW (ref >=60)
GFR, Est Non African American: 37 mL/min/{1.73_m2} — ABNORMAL LOW (ref >=60)
Globulin: 4.5 g/dL (calc) — ABNORMAL HIGH (ref 1.9–3.7)
Glucose, Bld: 96 mg/dL (ref 65–139)
Potassium: 4.8 mmol/L (ref 3.5–5.3)
Sodium: 140 mmol/L (ref 135–146)
Total Bilirubin: 0.5 mg/dL (ref 0.2–1.2)
Total Protein: 8.4 g/dL — ABNORMAL HIGH (ref 6.1–8.1)

## 2018-07-27 LAB — CBC
HCT: 37.9 % (ref 35.0–45.0)
Hemoglobin: 12 g/dL (ref 11.7–15.5)
MCH: 28 pg (ref 27.0–33.0)
MCHC: 31.7 g/dL — ABNORMAL LOW (ref 32.0–36.0)
MCV: 88.6 fL (ref 80.0–100.0)
MPV: 10.7 fL (ref 7.5–12.5)
Platelets: 445 10*3/uL — ABNORMAL HIGH (ref 140–400)
RBC: 4.28 10*6/uL (ref 3.80–5.10)
RDW: 15.4 % — ABNORMAL HIGH (ref 11.0–15.0)
WBC: 8.7 10*3/uL (ref 3.8–10.8)

## 2018-07-27 LAB — HEMOGLOBIN A1C
Hgb A1c MFr Bld: 5.3 % of total Hgb (ref <5.7)
Mean Plasma Glucose: 105 (calc)
eAG (mmol/L): 5.8 (calc)

## 2018-07-28 DIAGNOSIS — E785 Hyperlipidemia, unspecified: Secondary | ICD-10-CM | POA: Diagnosis not present

## 2018-07-28 DIAGNOSIS — I429 Cardiomyopathy, unspecified: Secondary | ICD-10-CM | POA: Diagnosis not present

## 2018-07-28 DIAGNOSIS — R339 Retention of urine, unspecified: Secondary | ICD-10-CM | POA: Diagnosis not present

## 2018-07-28 DIAGNOSIS — E11610 Type 2 diabetes mellitus with diabetic neuropathic arthropathy: Secondary | ICD-10-CM | POA: Diagnosis not present

## 2018-07-28 DIAGNOSIS — Z794 Long term (current) use of insulin: Secondary | ICD-10-CM | POA: Diagnosis not present

## 2018-07-28 DIAGNOSIS — I130 Hypertensive heart and chronic kidney disease with heart failure and stage 1 through stage 4 chronic kidney disease, or unspecified chronic kidney disease: Secondary | ICD-10-CM | POA: Diagnosis not present

## 2018-07-28 DIAGNOSIS — Z8673 Personal history of transient ischemic attack (TIA), and cerebral infarction without residual deficits: Secondary | ICD-10-CM | POA: Diagnosis not present

## 2018-07-28 DIAGNOSIS — I482 Chronic atrial fibrillation, unspecified: Secondary | ICD-10-CM | POA: Diagnosis not present

## 2018-07-28 DIAGNOSIS — G4730 Sleep apnea, unspecified: Secondary | ICD-10-CM | POA: Diagnosis not present

## 2018-07-28 DIAGNOSIS — Z853 Personal history of malignant neoplasm of breast: Secondary | ICD-10-CM | POA: Diagnosis not present

## 2018-07-28 DIAGNOSIS — Z7982 Long term (current) use of aspirin: Secondary | ICD-10-CM | POA: Diagnosis not present

## 2018-07-28 DIAGNOSIS — J449 Chronic obstructive pulmonary disease, unspecified: Secondary | ICD-10-CM | POA: Diagnosis not present

## 2018-07-28 DIAGNOSIS — I700 Atherosclerosis of aorta: Secondary | ICD-10-CM | POA: Diagnosis not present

## 2018-07-28 DIAGNOSIS — I5042 Chronic combined systolic (congestive) and diastolic (congestive) heart failure: Secondary | ICD-10-CM | POA: Diagnosis not present

## 2018-07-28 DIAGNOSIS — E1140 Type 2 diabetes mellitus with diabetic neuropathy, unspecified: Secondary | ICD-10-CM | POA: Diagnosis not present

## 2018-07-28 DIAGNOSIS — Z466 Encounter for fitting and adjustment of urinary device: Secondary | ICD-10-CM | POA: Diagnosis not present

## 2018-07-28 DIAGNOSIS — E1122 Type 2 diabetes mellitus with diabetic chronic kidney disease: Secondary | ICD-10-CM | POA: Diagnosis not present

## 2018-07-28 DIAGNOSIS — N183 Chronic kidney disease, stage 3 (moderate): Secondary | ICD-10-CM | POA: Diagnosis not present

## 2018-07-28 DIAGNOSIS — Z87440 Personal history of urinary (tract) infections: Secondary | ICD-10-CM | POA: Diagnosis not present

## 2018-07-29 ENCOUNTER — Ambulatory Visit: Payer: Self-pay | Admitting: Urology

## 2018-07-29 ENCOUNTER — Other Ambulatory Visit: Payer: Self-pay | Admitting: Family Medicine

## 2018-07-29 DIAGNOSIS — E785 Hyperlipidemia, unspecified: Secondary | ICD-10-CM | POA: Diagnosis not present

## 2018-07-29 DIAGNOSIS — I482 Chronic atrial fibrillation, unspecified: Secondary | ICD-10-CM | POA: Diagnosis not present

## 2018-07-29 DIAGNOSIS — E1140 Type 2 diabetes mellitus with diabetic neuropathy, unspecified: Secondary | ICD-10-CM | POA: Diagnosis not present

## 2018-07-29 DIAGNOSIS — I429 Cardiomyopathy, unspecified: Secondary | ICD-10-CM | POA: Diagnosis not present

## 2018-07-29 DIAGNOSIS — E11610 Type 2 diabetes mellitus with diabetic neuropathic arthropathy: Secondary | ICD-10-CM | POA: Diagnosis not present

## 2018-07-29 DIAGNOSIS — R339 Retention of urine, unspecified: Secondary | ICD-10-CM | POA: Diagnosis not present

## 2018-07-29 DIAGNOSIS — I700 Atherosclerosis of aorta: Secondary | ICD-10-CM | POA: Diagnosis not present

## 2018-07-29 DIAGNOSIS — Z7982 Long term (current) use of aspirin: Secondary | ICD-10-CM | POA: Diagnosis not present

## 2018-07-29 DIAGNOSIS — N183 Chronic kidney disease, stage 3 (moderate): Secondary | ICD-10-CM | POA: Diagnosis not present

## 2018-07-29 DIAGNOSIS — Z853 Personal history of malignant neoplasm of breast: Secondary | ICD-10-CM | POA: Diagnosis not present

## 2018-07-29 DIAGNOSIS — Z8673 Personal history of transient ischemic attack (TIA), and cerebral infarction without residual deficits: Secondary | ICD-10-CM | POA: Diagnosis not present

## 2018-07-29 DIAGNOSIS — G4730 Sleep apnea, unspecified: Secondary | ICD-10-CM | POA: Diagnosis not present

## 2018-07-29 DIAGNOSIS — E1122 Type 2 diabetes mellitus with diabetic chronic kidney disease: Secondary | ICD-10-CM | POA: Diagnosis not present

## 2018-07-29 DIAGNOSIS — I130 Hypertensive heart and chronic kidney disease with heart failure and stage 1 through stage 4 chronic kidney disease, or unspecified chronic kidney disease: Secondary | ICD-10-CM | POA: Diagnosis not present

## 2018-07-29 DIAGNOSIS — Z87440 Personal history of urinary (tract) infections: Secondary | ICD-10-CM | POA: Diagnosis not present

## 2018-07-29 DIAGNOSIS — I5042 Chronic combined systolic (congestive) and diastolic (congestive) heart failure: Secondary | ICD-10-CM | POA: Diagnosis not present

## 2018-07-29 DIAGNOSIS — J449 Chronic obstructive pulmonary disease, unspecified: Secondary | ICD-10-CM | POA: Diagnosis not present

## 2018-07-29 DIAGNOSIS — Z794 Long term (current) use of insulin: Secondary | ICD-10-CM | POA: Diagnosis not present

## 2018-07-29 DIAGNOSIS — Z466 Encounter for fitting and adjustment of urinary device: Secondary | ICD-10-CM | POA: Diagnosis not present

## 2018-07-29 MED ORDER — SOLIFENACIN SUCCINATE 5 MG PO TABS: 5.0000 mg | ORAL_TABLET | Freq: Every day | ORAL | 5 refills | Status: DC

## 2018-07-29 NOTE — Progress Notes (Signed)
vesicare 

## 2018-08-01 DIAGNOSIS — Z8673 Personal history of transient ischemic attack (TIA), and cerebral infarction without residual deficits: Secondary | ICD-10-CM | POA: Diagnosis not present

## 2018-08-01 DIAGNOSIS — I130 Hypertensive heart and chronic kidney disease with heart failure and stage 1 through stage 4 chronic kidney disease, or unspecified chronic kidney disease: Secondary | ICD-10-CM | POA: Diagnosis not present

## 2018-08-01 DIAGNOSIS — Z853 Personal history of malignant neoplasm of breast: Secondary | ICD-10-CM | POA: Diagnosis not present

## 2018-08-01 DIAGNOSIS — E785 Hyperlipidemia, unspecified: Secondary | ICD-10-CM | POA: Diagnosis not present

## 2018-08-01 DIAGNOSIS — J449 Chronic obstructive pulmonary disease, unspecified: Secondary | ICD-10-CM | POA: Diagnosis not present

## 2018-08-01 DIAGNOSIS — I482 Chronic atrial fibrillation, unspecified: Secondary | ICD-10-CM | POA: Diagnosis not present

## 2018-08-01 DIAGNOSIS — E1122 Type 2 diabetes mellitus with diabetic chronic kidney disease: Secondary | ICD-10-CM | POA: Diagnosis not present

## 2018-08-01 DIAGNOSIS — R339 Retention of urine, unspecified: Secondary | ICD-10-CM | POA: Diagnosis not present

## 2018-08-01 DIAGNOSIS — Z466 Encounter for fitting and adjustment of urinary device: Secondary | ICD-10-CM | POA: Diagnosis not present

## 2018-08-01 DIAGNOSIS — G4730 Sleep apnea, unspecified: Secondary | ICD-10-CM | POA: Diagnosis not present

## 2018-08-01 DIAGNOSIS — E11610 Type 2 diabetes mellitus with diabetic neuropathic arthropathy: Secondary | ICD-10-CM | POA: Diagnosis not present

## 2018-08-01 DIAGNOSIS — I700 Atherosclerosis of aorta: Secondary | ICD-10-CM | POA: Diagnosis not present

## 2018-08-01 DIAGNOSIS — N183 Chronic kidney disease, stage 3 (moderate): Secondary | ICD-10-CM | POA: Diagnosis not present

## 2018-08-01 DIAGNOSIS — Z7982 Long term (current) use of aspirin: Secondary | ICD-10-CM | POA: Diagnosis not present

## 2018-08-01 DIAGNOSIS — Z87440 Personal history of urinary (tract) infections: Secondary | ICD-10-CM | POA: Diagnosis not present

## 2018-08-01 DIAGNOSIS — Z794 Long term (current) use of insulin: Secondary | ICD-10-CM | POA: Diagnosis not present

## 2018-08-01 DIAGNOSIS — I5042 Chronic combined systolic (congestive) and diastolic (congestive) heart failure: Secondary | ICD-10-CM | POA: Diagnosis not present

## 2018-08-01 DIAGNOSIS — E1140 Type 2 diabetes mellitus with diabetic neuropathy, unspecified: Secondary | ICD-10-CM | POA: Diagnosis not present

## 2018-08-01 DIAGNOSIS — I429 Cardiomyopathy, unspecified: Secondary | ICD-10-CM | POA: Diagnosis not present

## 2018-08-02 ENCOUNTER — Telehealth: Payer: Self-pay | Admitting: Family Medicine

## 2018-08-02 DIAGNOSIS — I700 Atherosclerosis of aorta: Secondary | ICD-10-CM | POA: Diagnosis not present

## 2018-08-02 DIAGNOSIS — E1140 Type 2 diabetes mellitus with diabetic neuropathy, unspecified: Secondary | ICD-10-CM | POA: Diagnosis not present

## 2018-08-02 DIAGNOSIS — R339 Retention of urine, unspecified: Secondary | ICD-10-CM | POA: Diagnosis not present

## 2018-08-02 DIAGNOSIS — E11610 Type 2 diabetes mellitus with diabetic neuropathic arthropathy: Secondary | ICD-10-CM | POA: Diagnosis not present

## 2018-08-02 DIAGNOSIS — J449 Chronic obstructive pulmonary disease, unspecified: Secondary | ICD-10-CM | POA: Diagnosis not present

## 2018-08-02 DIAGNOSIS — Z87440 Personal history of urinary (tract) infections: Secondary | ICD-10-CM | POA: Diagnosis not present

## 2018-08-02 DIAGNOSIS — N183 Chronic kidney disease, stage 3 (moderate): Secondary | ICD-10-CM | POA: Diagnosis not present

## 2018-08-02 DIAGNOSIS — I5042 Chronic combined systolic (congestive) and diastolic (congestive) heart failure: Secondary | ICD-10-CM | POA: Diagnosis not present

## 2018-08-02 DIAGNOSIS — G4730 Sleep apnea, unspecified: Secondary | ICD-10-CM | POA: Diagnosis not present

## 2018-08-02 DIAGNOSIS — I130 Hypertensive heart and chronic kidney disease with heart failure and stage 1 through stage 4 chronic kidney disease, or unspecified chronic kidney disease: Secondary | ICD-10-CM | POA: Diagnosis not present

## 2018-08-02 DIAGNOSIS — Z853 Personal history of malignant neoplasm of breast: Secondary | ICD-10-CM | POA: Diagnosis not present

## 2018-08-02 DIAGNOSIS — E785 Hyperlipidemia, unspecified: Secondary | ICD-10-CM | POA: Diagnosis not present

## 2018-08-02 DIAGNOSIS — E1122 Type 2 diabetes mellitus with diabetic chronic kidney disease: Secondary | ICD-10-CM | POA: Diagnosis not present

## 2018-08-02 DIAGNOSIS — I429 Cardiomyopathy, unspecified: Secondary | ICD-10-CM | POA: Diagnosis not present

## 2018-08-02 DIAGNOSIS — I482 Chronic atrial fibrillation, unspecified: Secondary | ICD-10-CM | POA: Diagnosis not present

## 2018-08-02 DIAGNOSIS — Z7982 Long term (current) use of aspirin: Secondary | ICD-10-CM | POA: Diagnosis not present

## 2018-08-02 DIAGNOSIS — Z466 Encounter for fitting and adjustment of urinary device: Secondary | ICD-10-CM | POA: Diagnosis not present

## 2018-08-02 DIAGNOSIS — Z794 Long term (current) use of insulin: Secondary | ICD-10-CM | POA: Diagnosis not present

## 2018-08-02 DIAGNOSIS — Z8673 Personal history of transient ischemic attack (TIA), and cerebral infarction without residual deficits: Secondary | ICD-10-CM | POA: Diagnosis not present

## 2018-08-02 NOTE — Telephone Encounter (Signed)
[name] w/ [facility]  Called to report blood pressure: 160/100, no other symptoms, just protocol. Cb#: [phone]

## 2018-08-02 NOTE — Telephone Encounter (Signed)
[name] is calling to advise that Right Arm BP reading is 160/100 Left arm BP is 150/110 Pt states no headaches

## 2018-08-02 NOTE — Telephone Encounter (Signed)
Do you want to do anything different with her blood pressure medication?

## 2018-08-02 NOTE — Telephone Encounter (Signed)
Noted. A message has been sent to dr from previous message

## 2018-08-03 DIAGNOSIS — Z853 Personal history of malignant neoplasm of breast: Secondary | ICD-10-CM | POA: Diagnosis not present

## 2018-08-03 DIAGNOSIS — I130 Hypertensive heart and chronic kidney disease with heart failure and stage 1 through stage 4 chronic kidney disease, or unspecified chronic kidney disease: Secondary | ICD-10-CM | POA: Diagnosis not present

## 2018-08-03 DIAGNOSIS — G4730 Sleep apnea, unspecified: Secondary | ICD-10-CM | POA: Diagnosis not present

## 2018-08-03 DIAGNOSIS — I429 Cardiomyopathy, unspecified: Secondary | ICD-10-CM | POA: Diagnosis not present

## 2018-08-03 DIAGNOSIS — I5042 Chronic combined systolic (congestive) and diastolic (congestive) heart failure: Secondary | ICD-10-CM | POA: Diagnosis not present

## 2018-08-03 DIAGNOSIS — R339 Retention of urine, unspecified: Secondary | ICD-10-CM | POA: Diagnosis not present

## 2018-08-03 DIAGNOSIS — E785 Hyperlipidemia, unspecified: Secondary | ICD-10-CM | POA: Diagnosis not present

## 2018-08-03 DIAGNOSIS — Z7982 Long term (current) use of aspirin: Secondary | ICD-10-CM | POA: Diagnosis not present

## 2018-08-03 DIAGNOSIS — Z466 Encounter for fitting and adjustment of urinary device: Secondary | ICD-10-CM | POA: Diagnosis not present

## 2018-08-03 DIAGNOSIS — E11610 Type 2 diabetes mellitus with diabetic neuropathic arthropathy: Secondary | ICD-10-CM | POA: Diagnosis not present

## 2018-08-03 DIAGNOSIS — Z8673 Personal history of transient ischemic attack (TIA), and cerebral infarction without residual deficits: Secondary | ICD-10-CM | POA: Diagnosis not present

## 2018-08-03 DIAGNOSIS — I482 Chronic atrial fibrillation, unspecified: Secondary | ICD-10-CM | POA: Diagnosis not present

## 2018-08-03 DIAGNOSIS — E1122 Type 2 diabetes mellitus with diabetic chronic kidney disease: Secondary | ICD-10-CM | POA: Diagnosis not present

## 2018-08-03 DIAGNOSIS — N183 Chronic kidney disease, stage 3 (moderate): Secondary | ICD-10-CM | POA: Diagnosis not present

## 2018-08-03 DIAGNOSIS — E1140 Type 2 diabetes mellitus with diabetic neuropathy, unspecified: Secondary | ICD-10-CM | POA: Diagnosis not present

## 2018-08-03 DIAGNOSIS — I700 Atherosclerosis of aorta: Secondary | ICD-10-CM | POA: Diagnosis not present

## 2018-08-03 DIAGNOSIS — Z794 Long term (current) use of insulin: Secondary | ICD-10-CM | POA: Diagnosis not present

## 2018-08-03 DIAGNOSIS — J449 Chronic obstructive pulmonary disease, unspecified: Secondary | ICD-10-CM | POA: Diagnosis not present

## 2018-08-03 DIAGNOSIS — Z87440 Personal history of urinary (tract) infections: Secondary | ICD-10-CM | POA: Diagnosis not present

## 2018-08-04 ENCOUNTER — Other Ambulatory Visit: Payer: Self-pay | Admitting: Urology

## 2018-08-04 DIAGNOSIS — G4730 Sleep apnea, unspecified: Secondary | ICD-10-CM | POA: Diagnosis not present

## 2018-08-04 DIAGNOSIS — I130 Hypertensive heart and chronic kidney disease with heart failure and stage 1 through stage 4 chronic kidney disease, or unspecified chronic kidney disease: Secondary | ICD-10-CM | POA: Diagnosis not present

## 2018-08-04 DIAGNOSIS — Z7982 Long term (current) use of aspirin: Secondary | ICD-10-CM | POA: Diagnosis not present

## 2018-08-04 DIAGNOSIS — I482 Chronic atrial fibrillation, unspecified: Secondary | ICD-10-CM | POA: Diagnosis not present

## 2018-08-04 DIAGNOSIS — I5042 Chronic combined systolic (congestive) and diastolic (congestive) heart failure: Secondary | ICD-10-CM | POA: Diagnosis not present

## 2018-08-04 DIAGNOSIS — I429 Cardiomyopathy, unspecified: Secondary | ICD-10-CM | POA: Diagnosis not present

## 2018-08-04 DIAGNOSIS — E11610 Type 2 diabetes mellitus with diabetic neuropathic arthropathy: Secondary | ICD-10-CM | POA: Diagnosis not present

## 2018-08-04 DIAGNOSIS — J449 Chronic obstructive pulmonary disease, unspecified: Secondary | ICD-10-CM | POA: Diagnosis not present

## 2018-08-04 DIAGNOSIS — Z87440 Personal history of urinary (tract) infections: Secondary | ICD-10-CM | POA: Diagnosis not present

## 2018-08-04 DIAGNOSIS — Z8673 Personal history of transient ischemic attack (TIA), and cerebral infarction without residual deficits: Secondary | ICD-10-CM | POA: Diagnosis not present

## 2018-08-04 DIAGNOSIS — I700 Atherosclerosis of aorta: Secondary | ICD-10-CM | POA: Diagnosis not present

## 2018-08-04 DIAGNOSIS — E1122 Type 2 diabetes mellitus with diabetic chronic kidney disease: Secondary | ICD-10-CM | POA: Diagnosis not present

## 2018-08-04 DIAGNOSIS — R339 Retention of urine, unspecified: Secondary | ICD-10-CM | POA: Diagnosis not present

## 2018-08-04 DIAGNOSIS — E785 Hyperlipidemia, unspecified: Secondary | ICD-10-CM | POA: Diagnosis not present

## 2018-08-04 DIAGNOSIS — Z853 Personal history of malignant neoplasm of breast: Secondary | ICD-10-CM | POA: Diagnosis not present

## 2018-08-04 DIAGNOSIS — Z466 Encounter for fitting and adjustment of urinary device: Secondary | ICD-10-CM | POA: Diagnosis not present

## 2018-08-04 DIAGNOSIS — N130 Hydronephrosis with ureteropelvic junction obstruction: Secondary | ICD-10-CM

## 2018-08-04 DIAGNOSIS — N183 Chronic kidney disease, stage 3 (moderate): Secondary | ICD-10-CM | POA: Diagnosis not present

## 2018-08-04 DIAGNOSIS — Z794 Long term (current) use of insulin: Secondary | ICD-10-CM | POA: Diagnosis not present

## 2018-08-04 DIAGNOSIS — E1140 Type 2 diabetes mellitus with diabetic neuropathy, unspecified: Secondary | ICD-10-CM | POA: Diagnosis not present

## 2018-08-05 NOTE — Telephone Encounter (Signed)
At visit when it was high, her amlodipine was increased from 5 mg to 5 plus 2.5  , total of 7.5 mg daily Since calling in still concerned,dose change has not had sufficient time, encourage her to ensure reduction in sodium intake and take meds as prescribed, has Nov follow up, keep this pls!

## 2018-08-08 DIAGNOSIS — I130 Hypertensive heart and chronic kidney disease with heart failure and stage 1 through stage 4 chronic kidney disease, or unspecified chronic kidney disease: Secondary | ICD-10-CM | POA: Diagnosis not present

## 2018-08-08 DIAGNOSIS — G4730 Sleep apnea, unspecified: Secondary | ICD-10-CM | POA: Diagnosis not present

## 2018-08-08 DIAGNOSIS — I5042 Chronic combined systolic (congestive) and diastolic (congestive) heart failure: Secondary | ICD-10-CM | POA: Diagnosis not present

## 2018-08-08 DIAGNOSIS — Z853 Personal history of malignant neoplasm of breast: Secondary | ICD-10-CM | POA: Diagnosis not present

## 2018-08-08 DIAGNOSIS — N183 Chronic kidney disease, stage 3 (moderate): Secondary | ICD-10-CM | POA: Diagnosis not present

## 2018-08-08 DIAGNOSIS — R339 Retention of urine, unspecified: Secondary | ICD-10-CM | POA: Diagnosis not present

## 2018-08-08 DIAGNOSIS — E1140 Type 2 diabetes mellitus with diabetic neuropathy, unspecified: Secondary | ICD-10-CM | POA: Diagnosis not present

## 2018-08-08 DIAGNOSIS — E785 Hyperlipidemia, unspecified: Secondary | ICD-10-CM | POA: Diagnosis not present

## 2018-08-08 DIAGNOSIS — I4891 Unspecified atrial fibrillation: Secondary | ICD-10-CM | POA: Diagnosis not present

## 2018-08-08 DIAGNOSIS — E11610 Type 2 diabetes mellitus with diabetic neuropathic arthropathy: Secondary | ICD-10-CM | POA: Diagnosis not present

## 2018-08-08 DIAGNOSIS — Z466 Encounter for fitting and adjustment of urinary device: Secondary | ICD-10-CM | POA: Diagnosis not present

## 2018-08-08 DIAGNOSIS — I429 Cardiomyopathy, unspecified: Secondary | ICD-10-CM | POA: Diagnosis not present

## 2018-08-08 DIAGNOSIS — Z87440 Personal history of urinary (tract) infections: Secondary | ICD-10-CM | POA: Diagnosis not present

## 2018-08-08 DIAGNOSIS — Z7982 Long term (current) use of aspirin: Secondary | ICD-10-CM | POA: Diagnosis not present

## 2018-08-08 DIAGNOSIS — I700 Atherosclerosis of aorta: Secondary | ICD-10-CM | POA: Diagnosis not present

## 2018-08-08 DIAGNOSIS — J449 Chronic obstructive pulmonary disease, unspecified: Secondary | ICD-10-CM | POA: Diagnosis not present

## 2018-08-08 DIAGNOSIS — E1122 Type 2 diabetes mellitus with diabetic chronic kidney disease: Secondary | ICD-10-CM | POA: Diagnosis not present

## 2018-08-08 DIAGNOSIS — Z794 Long term (current) use of insulin: Secondary | ICD-10-CM | POA: Diagnosis not present

## 2018-08-08 DIAGNOSIS — Z8673 Personal history of transient ischemic attack (TIA), and cerebral infarction without residual deficits: Secondary | ICD-10-CM | POA: Diagnosis not present

## 2018-08-09 DIAGNOSIS — I700 Atherosclerosis of aorta: Secondary | ICD-10-CM | POA: Diagnosis not present

## 2018-08-09 DIAGNOSIS — Z466 Encounter for fitting and adjustment of urinary device: Secondary | ICD-10-CM | POA: Diagnosis not present

## 2018-08-09 DIAGNOSIS — E1140 Type 2 diabetes mellitus with diabetic neuropathy, unspecified: Secondary | ICD-10-CM | POA: Diagnosis not present

## 2018-08-09 DIAGNOSIS — Z853 Personal history of malignant neoplasm of breast: Secondary | ICD-10-CM | POA: Diagnosis not present

## 2018-08-09 DIAGNOSIS — I4891 Unspecified atrial fibrillation: Secondary | ICD-10-CM | POA: Diagnosis not present

## 2018-08-09 DIAGNOSIS — G4730 Sleep apnea, unspecified: Secondary | ICD-10-CM | POA: Diagnosis not present

## 2018-08-09 DIAGNOSIS — E785 Hyperlipidemia, unspecified: Secondary | ICD-10-CM | POA: Diagnosis not present

## 2018-08-09 DIAGNOSIS — J449 Chronic obstructive pulmonary disease, unspecified: Secondary | ICD-10-CM | POA: Diagnosis not present

## 2018-08-09 DIAGNOSIS — I5042 Chronic combined systolic (congestive) and diastolic (congestive) heart failure: Secondary | ICD-10-CM | POA: Diagnosis not present

## 2018-08-09 DIAGNOSIS — Z8673 Personal history of transient ischemic attack (TIA), and cerebral infarction without residual deficits: Secondary | ICD-10-CM | POA: Diagnosis not present

## 2018-08-09 DIAGNOSIS — I429 Cardiomyopathy, unspecified: Secondary | ICD-10-CM | POA: Diagnosis not present

## 2018-08-09 DIAGNOSIS — Z87440 Personal history of urinary (tract) infections: Secondary | ICD-10-CM | POA: Diagnosis not present

## 2018-08-09 DIAGNOSIS — I130 Hypertensive heart and chronic kidney disease with heart failure and stage 1 through stage 4 chronic kidney disease, or unspecified chronic kidney disease: Secondary | ICD-10-CM | POA: Diagnosis not present

## 2018-08-09 DIAGNOSIS — E1122 Type 2 diabetes mellitus with diabetic chronic kidney disease: Secondary | ICD-10-CM | POA: Diagnosis not present

## 2018-08-09 DIAGNOSIS — N183 Chronic kidney disease, stage 3 (moderate): Secondary | ICD-10-CM | POA: Diagnosis not present

## 2018-08-09 DIAGNOSIS — Z794 Long term (current) use of insulin: Secondary | ICD-10-CM | POA: Diagnosis not present

## 2018-08-09 DIAGNOSIS — E11610 Type 2 diabetes mellitus with diabetic neuropathic arthropathy: Secondary | ICD-10-CM | POA: Diagnosis not present

## 2018-08-09 DIAGNOSIS — Z7982 Long term (current) use of aspirin: Secondary | ICD-10-CM | POA: Diagnosis not present

## 2018-08-09 DIAGNOSIS — R339 Retention of urine, unspecified: Secondary | ICD-10-CM | POA: Diagnosis not present

## 2018-08-09 NOTE — Telephone Encounter (Signed)
Patient aware.

## 2018-08-10 DIAGNOSIS — E1122 Type 2 diabetes mellitus with diabetic chronic kidney disease: Secondary | ICD-10-CM | POA: Diagnosis not present

## 2018-08-10 DIAGNOSIS — E11610 Type 2 diabetes mellitus with diabetic neuropathic arthropathy: Secondary | ICD-10-CM | POA: Diagnosis not present

## 2018-08-10 DIAGNOSIS — N183 Chronic kidney disease, stage 3 (moderate): Secondary | ICD-10-CM | POA: Diagnosis not present

## 2018-08-10 DIAGNOSIS — Z87440 Personal history of urinary (tract) infections: Secondary | ICD-10-CM | POA: Diagnosis not present

## 2018-08-10 DIAGNOSIS — Z853 Personal history of malignant neoplasm of breast: Secondary | ICD-10-CM | POA: Diagnosis not present

## 2018-08-10 DIAGNOSIS — Z8673 Personal history of transient ischemic attack (TIA), and cerebral infarction without residual deficits: Secondary | ICD-10-CM | POA: Diagnosis not present

## 2018-08-10 DIAGNOSIS — R339 Retention of urine, unspecified: Secondary | ICD-10-CM | POA: Diagnosis not present

## 2018-08-10 DIAGNOSIS — I130 Hypertensive heart and chronic kidney disease with heart failure and stage 1 through stage 4 chronic kidney disease, or unspecified chronic kidney disease: Secondary | ICD-10-CM | POA: Diagnosis not present

## 2018-08-10 DIAGNOSIS — I4891 Unspecified atrial fibrillation: Secondary | ICD-10-CM | POA: Diagnosis not present

## 2018-08-10 DIAGNOSIS — Z7982 Long term (current) use of aspirin: Secondary | ICD-10-CM | POA: Diagnosis not present

## 2018-08-10 DIAGNOSIS — I429 Cardiomyopathy, unspecified: Secondary | ICD-10-CM | POA: Diagnosis not present

## 2018-08-10 DIAGNOSIS — Z794 Long term (current) use of insulin: Secondary | ICD-10-CM | POA: Diagnosis not present

## 2018-08-10 DIAGNOSIS — J449 Chronic obstructive pulmonary disease, unspecified: Secondary | ICD-10-CM | POA: Diagnosis not present

## 2018-08-10 DIAGNOSIS — E785 Hyperlipidemia, unspecified: Secondary | ICD-10-CM | POA: Diagnosis not present

## 2018-08-10 DIAGNOSIS — Z466 Encounter for fitting and adjustment of urinary device: Secondary | ICD-10-CM | POA: Diagnosis not present

## 2018-08-10 DIAGNOSIS — I700 Atherosclerosis of aorta: Secondary | ICD-10-CM | POA: Diagnosis not present

## 2018-08-10 DIAGNOSIS — E1140 Type 2 diabetes mellitus with diabetic neuropathy, unspecified: Secondary | ICD-10-CM | POA: Diagnosis not present

## 2018-08-10 DIAGNOSIS — G4730 Sleep apnea, unspecified: Secondary | ICD-10-CM | POA: Diagnosis not present

## 2018-08-10 DIAGNOSIS — I5042 Chronic combined systolic (congestive) and diastolic (congestive) heart failure: Secondary | ICD-10-CM | POA: Diagnosis not present

## 2018-08-11 ENCOUNTER — Ambulatory Visit: Payer: Medicare Other | Admitting: Gastroenterology

## 2018-08-11 DIAGNOSIS — Z466 Encounter for fitting and adjustment of urinary device: Secondary | ICD-10-CM | POA: Diagnosis not present

## 2018-08-11 DIAGNOSIS — Z87440 Personal history of urinary (tract) infections: Secondary | ICD-10-CM | POA: Diagnosis not present

## 2018-08-11 DIAGNOSIS — I130 Hypertensive heart and chronic kidney disease with heart failure and stage 1 through stage 4 chronic kidney disease, or unspecified chronic kidney disease: Secondary | ICD-10-CM | POA: Diagnosis not present

## 2018-08-11 DIAGNOSIS — Z794 Long term (current) use of insulin: Secondary | ICD-10-CM | POA: Diagnosis not present

## 2018-08-11 DIAGNOSIS — I700 Atherosclerosis of aorta: Secondary | ICD-10-CM | POA: Diagnosis not present

## 2018-08-11 DIAGNOSIS — I429 Cardiomyopathy, unspecified: Secondary | ICD-10-CM | POA: Diagnosis not present

## 2018-08-11 DIAGNOSIS — E11610 Type 2 diabetes mellitus with diabetic neuropathic arthropathy: Secondary | ICD-10-CM | POA: Diagnosis not present

## 2018-08-11 DIAGNOSIS — Z7982 Long term (current) use of aspirin: Secondary | ICD-10-CM | POA: Diagnosis not present

## 2018-08-11 DIAGNOSIS — N183 Chronic kidney disease, stage 3 (moderate): Secondary | ICD-10-CM | POA: Diagnosis not present

## 2018-08-11 DIAGNOSIS — I5042 Chronic combined systolic (congestive) and diastolic (congestive) heart failure: Secondary | ICD-10-CM | POA: Diagnosis not present

## 2018-08-11 DIAGNOSIS — R339 Retention of urine, unspecified: Secondary | ICD-10-CM | POA: Diagnosis not present

## 2018-08-11 DIAGNOSIS — J449 Chronic obstructive pulmonary disease, unspecified: Secondary | ICD-10-CM | POA: Diagnosis not present

## 2018-08-11 DIAGNOSIS — E1140 Type 2 diabetes mellitus with diabetic neuropathy, unspecified: Secondary | ICD-10-CM | POA: Diagnosis not present

## 2018-08-11 DIAGNOSIS — Z8673 Personal history of transient ischemic attack (TIA), and cerebral infarction without residual deficits: Secondary | ICD-10-CM | POA: Diagnosis not present

## 2018-08-11 DIAGNOSIS — E785 Hyperlipidemia, unspecified: Secondary | ICD-10-CM | POA: Diagnosis not present

## 2018-08-11 DIAGNOSIS — G4730 Sleep apnea, unspecified: Secondary | ICD-10-CM | POA: Diagnosis not present

## 2018-08-11 DIAGNOSIS — Z853 Personal history of malignant neoplasm of breast: Secondary | ICD-10-CM | POA: Diagnosis not present

## 2018-08-11 DIAGNOSIS — E1122 Type 2 diabetes mellitus with diabetic chronic kidney disease: Secondary | ICD-10-CM | POA: Diagnosis not present

## 2018-08-11 DIAGNOSIS — I4891 Unspecified atrial fibrillation: Secondary | ICD-10-CM | POA: Diagnosis not present

## 2018-08-12 ENCOUNTER — Other Ambulatory Visit: Payer: Self-pay | Admitting: Family Medicine

## 2018-08-12 DIAGNOSIS — I429 Cardiomyopathy, unspecified: Secondary | ICD-10-CM | POA: Diagnosis not present

## 2018-08-12 DIAGNOSIS — J449 Chronic obstructive pulmonary disease, unspecified: Secondary | ICD-10-CM | POA: Diagnosis not present

## 2018-08-12 DIAGNOSIS — Z8673 Personal history of transient ischemic attack (TIA), and cerebral infarction without residual deficits: Secondary | ICD-10-CM | POA: Diagnosis not present

## 2018-08-12 DIAGNOSIS — E785 Hyperlipidemia, unspecified: Secondary | ICD-10-CM | POA: Diagnosis not present

## 2018-08-12 DIAGNOSIS — N183 Chronic kidney disease, stage 3 (moderate): Secondary | ICD-10-CM | POA: Diagnosis not present

## 2018-08-12 DIAGNOSIS — I5042 Chronic combined systolic (congestive) and diastolic (congestive) heart failure: Secondary | ICD-10-CM | POA: Diagnosis not present

## 2018-08-12 DIAGNOSIS — Z466 Encounter for fitting and adjustment of urinary device: Secondary | ICD-10-CM | POA: Diagnosis not present

## 2018-08-12 DIAGNOSIS — Z7982 Long term (current) use of aspirin: Secondary | ICD-10-CM | POA: Diagnosis not present

## 2018-08-12 DIAGNOSIS — R339 Retention of urine, unspecified: Secondary | ICD-10-CM | POA: Diagnosis not present

## 2018-08-12 DIAGNOSIS — G4730 Sleep apnea, unspecified: Secondary | ICD-10-CM | POA: Diagnosis not present

## 2018-08-12 DIAGNOSIS — E1122 Type 2 diabetes mellitus with diabetic chronic kidney disease: Secondary | ICD-10-CM | POA: Diagnosis not present

## 2018-08-12 DIAGNOSIS — I4891 Unspecified atrial fibrillation: Secondary | ICD-10-CM | POA: Diagnosis not present

## 2018-08-12 DIAGNOSIS — E1140 Type 2 diabetes mellitus with diabetic neuropathy, unspecified: Secondary | ICD-10-CM | POA: Diagnosis not present

## 2018-08-12 DIAGNOSIS — I509 Heart failure, unspecified: Secondary | ICD-10-CM | POA: Diagnosis not present

## 2018-08-12 DIAGNOSIS — I700 Atherosclerosis of aorta: Secondary | ICD-10-CM | POA: Diagnosis not present

## 2018-08-12 DIAGNOSIS — E11610 Type 2 diabetes mellitus with diabetic neuropathic arthropathy: Secondary | ICD-10-CM | POA: Diagnosis not present

## 2018-08-12 DIAGNOSIS — Z794 Long term (current) use of insulin: Secondary | ICD-10-CM | POA: Diagnosis not present

## 2018-08-12 DIAGNOSIS — I130 Hypertensive heart and chronic kidney disease with heart failure and stage 1 through stage 4 chronic kidney disease, or unspecified chronic kidney disease: Secondary | ICD-10-CM | POA: Diagnosis not present

## 2018-08-12 DIAGNOSIS — Z853 Personal history of malignant neoplasm of breast: Secondary | ICD-10-CM | POA: Diagnosis not present

## 2018-08-12 DIAGNOSIS — Z87440 Personal history of urinary (tract) infections: Secondary | ICD-10-CM | POA: Diagnosis not present

## 2018-08-15 ENCOUNTER — Telehealth: Payer: Self-pay | Admitting: Family Medicine

## 2018-08-15 ENCOUNTER — Ambulatory Visit (HOSPITAL_COMMUNITY): Payer: Medicare Other

## 2018-08-15 DIAGNOSIS — E1122 Type 2 diabetes mellitus with diabetic chronic kidney disease: Secondary | ICD-10-CM | POA: Diagnosis not present

## 2018-08-15 DIAGNOSIS — E11610 Type 2 diabetes mellitus with diabetic neuropathic arthropathy: Secondary | ICD-10-CM

## 2018-08-15 DIAGNOSIS — J449 Chronic obstructive pulmonary disease, unspecified: Secondary | ICD-10-CM

## 2018-08-15 DIAGNOSIS — I130 Hypertensive heart and chronic kidney disease with heart failure and stage 1 through stage 4 chronic kidney disease, or unspecified chronic kidney disease: Secondary | ICD-10-CM | POA: Diagnosis not present

## 2018-08-15 DIAGNOSIS — I5042 Chronic combined systolic (congestive) and diastolic (congestive) heart failure: Secondary | ICD-10-CM | POA: Diagnosis not present

## 2018-08-15 DIAGNOSIS — N183 Chronic kidney disease, stage 3 (moderate): Secondary | ICD-10-CM | POA: Diagnosis not present

## 2018-08-15 DIAGNOSIS — E1140 Type 2 diabetes mellitus with diabetic neuropathy, unspecified: Secondary | ICD-10-CM

## 2018-08-15 DIAGNOSIS — I4891 Unspecified atrial fibrillation: Secondary | ICD-10-CM

## 2018-08-15 NOTE — Telephone Encounter (Signed)
Please call the pt regarding Cath Bags

## 2018-08-16 DIAGNOSIS — I429 Cardiomyopathy, unspecified: Secondary | ICD-10-CM | POA: Diagnosis not present

## 2018-08-16 DIAGNOSIS — E1140 Type 2 diabetes mellitus with diabetic neuropathy, unspecified: Secondary | ICD-10-CM | POA: Diagnosis not present

## 2018-08-16 DIAGNOSIS — R338 Other retention of urine: Secondary | ICD-10-CM | POA: Diagnosis not present

## 2018-08-16 DIAGNOSIS — E1122 Type 2 diabetes mellitus with diabetic chronic kidney disease: Secondary | ICD-10-CM | POA: Diagnosis not present

## 2018-08-16 DIAGNOSIS — Z794 Long term (current) use of insulin: Secondary | ICD-10-CM | POA: Diagnosis not present

## 2018-08-16 DIAGNOSIS — J449 Chronic obstructive pulmonary disease, unspecified: Secondary | ICD-10-CM | POA: Diagnosis not present

## 2018-08-16 DIAGNOSIS — Z7982 Long term (current) use of aspirin: Secondary | ICD-10-CM | POA: Diagnosis not present

## 2018-08-16 DIAGNOSIS — N183 Chronic kidney disease, stage 3 (moderate): Secondary | ICD-10-CM | POA: Diagnosis not present

## 2018-08-16 DIAGNOSIS — Z8673 Personal history of transient ischemic attack (TIA), and cerebral infarction without residual deficits: Secondary | ICD-10-CM | POA: Diagnosis not present

## 2018-08-16 DIAGNOSIS — I130 Hypertensive heart and chronic kidney disease with heart failure and stage 1 through stage 4 chronic kidney disease, or unspecified chronic kidney disease: Secondary | ICD-10-CM | POA: Diagnosis not present

## 2018-08-16 DIAGNOSIS — G4730 Sleep apnea, unspecified: Secondary | ICD-10-CM | POA: Diagnosis not present

## 2018-08-16 DIAGNOSIS — Z853 Personal history of malignant neoplasm of breast: Secondary | ICD-10-CM | POA: Diagnosis not present

## 2018-08-16 DIAGNOSIS — E11610 Type 2 diabetes mellitus with diabetic neuropathic arthropathy: Secondary | ICD-10-CM | POA: Diagnosis not present

## 2018-08-16 DIAGNOSIS — E785 Hyperlipidemia, unspecified: Secondary | ICD-10-CM | POA: Diagnosis not present

## 2018-08-16 DIAGNOSIS — Z87440 Personal history of urinary (tract) infections: Secondary | ICD-10-CM | POA: Diagnosis not present

## 2018-08-16 DIAGNOSIS — I700 Atherosclerosis of aorta: Secondary | ICD-10-CM | POA: Diagnosis not present

## 2018-08-16 DIAGNOSIS — I5042 Chronic combined systolic (congestive) and diastolic (congestive) heart failure: Secondary | ICD-10-CM | POA: Diagnosis not present

## 2018-08-16 DIAGNOSIS — I4891 Unspecified atrial fibrillation: Secondary | ICD-10-CM | POA: Diagnosis not present

## 2018-08-16 DIAGNOSIS — Z466 Encounter for fitting and adjustment of urinary device: Secondary | ICD-10-CM | POA: Diagnosis not present

## 2018-08-16 DIAGNOSIS — R339 Retention of urine, unspecified: Secondary | ICD-10-CM | POA: Diagnosis not present

## 2018-08-16 NOTE — Telephone Encounter (Signed)
She needs to contact urology (dr Beatrix Fetters if asking about cath bags)

## 2018-08-17 DIAGNOSIS — Z7982 Long term (current) use of aspirin: Secondary | ICD-10-CM | POA: Diagnosis not present

## 2018-08-17 DIAGNOSIS — I4891 Unspecified atrial fibrillation: Secondary | ICD-10-CM | POA: Diagnosis not present

## 2018-08-17 DIAGNOSIS — I429 Cardiomyopathy, unspecified: Secondary | ICD-10-CM | POA: Diagnosis not present

## 2018-08-17 DIAGNOSIS — J449 Chronic obstructive pulmonary disease, unspecified: Secondary | ICD-10-CM | POA: Diagnosis not present

## 2018-08-17 DIAGNOSIS — Z794 Long term (current) use of insulin: Secondary | ICD-10-CM | POA: Diagnosis not present

## 2018-08-17 DIAGNOSIS — Z8673 Personal history of transient ischemic attack (TIA), and cerebral infarction without residual deficits: Secondary | ICD-10-CM | POA: Diagnosis not present

## 2018-08-17 DIAGNOSIS — Z466 Encounter for fitting and adjustment of urinary device: Secondary | ICD-10-CM | POA: Diagnosis not present

## 2018-08-17 DIAGNOSIS — R339 Retention of urine, unspecified: Secondary | ICD-10-CM | POA: Diagnosis not present

## 2018-08-17 DIAGNOSIS — G4730 Sleep apnea, unspecified: Secondary | ICD-10-CM | POA: Diagnosis not present

## 2018-08-17 DIAGNOSIS — N183 Chronic kidney disease, stage 3 (moderate): Secondary | ICD-10-CM | POA: Diagnosis not present

## 2018-08-17 DIAGNOSIS — I130 Hypertensive heart and chronic kidney disease with heart failure and stage 1 through stage 4 chronic kidney disease, or unspecified chronic kidney disease: Secondary | ICD-10-CM | POA: Diagnosis not present

## 2018-08-17 DIAGNOSIS — E1122 Type 2 diabetes mellitus with diabetic chronic kidney disease: Secondary | ICD-10-CM | POA: Diagnosis not present

## 2018-08-17 DIAGNOSIS — E11610 Type 2 diabetes mellitus with diabetic neuropathic arthropathy: Secondary | ICD-10-CM | POA: Diagnosis not present

## 2018-08-17 DIAGNOSIS — Z853 Personal history of malignant neoplasm of breast: Secondary | ICD-10-CM | POA: Diagnosis not present

## 2018-08-17 DIAGNOSIS — Z87440 Personal history of urinary (tract) infections: Secondary | ICD-10-CM | POA: Diagnosis not present

## 2018-08-17 DIAGNOSIS — E785 Hyperlipidemia, unspecified: Secondary | ICD-10-CM | POA: Diagnosis not present

## 2018-08-17 DIAGNOSIS — I700 Atherosclerosis of aorta: Secondary | ICD-10-CM | POA: Diagnosis not present

## 2018-08-17 DIAGNOSIS — E1140 Type 2 diabetes mellitus with diabetic neuropathy, unspecified: Secondary | ICD-10-CM | POA: Diagnosis not present

## 2018-08-17 DIAGNOSIS — I5042 Chronic combined systolic (congestive) and diastolic (congestive) heart failure: Secondary | ICD-10-CM | POA: Diagnosis not present

## 2018-08-18 NOTE — Telephone Encounter (Signed)
I advised --thank you

## 2018-08-23 ENCOUNTER — Ambulatory Visit (HOSPITAL_COMMUNITY): Payer: Medicare Other

## 2018-08-24 DIAGNOSIS — N183 Chronic kidney disease, stage 3 (moderate): Secondary | ICD-10-CM | POA: Diagnosis not present

## 2018-08-24 DIAGNOSIS — Z8673 Personal history of transient ischemic attack (TIA), and cerebral infarction without residual deficits: Secondary | ICD-10-CM | POA: Diagnosis not present

## 2018-08-24 DIAGNOSIS — J449 Chronic obstructive pulmonary disease, unspecified: Secondary | ICD-10-CM | POA: Diagnosis not present

## 2018-08-24 DIAGNOSIS — Z853 Personal history of malignant neoplasm of breast: Secondary | ICD-10-CM | POA: Diagnosis not present

## 2018-08-24 DIAGNOSIS — Z466 Encounter for fitting and adjustment of urinary device: Secondary | ICD-10-CM | POA: Diagnosis not present

## 2018-08-24 DIAGNOSIS — I130 Hypertensive heart and chronic kidney disease with heart failure and stage 1 through stage 4 chronic kidney disease, or unspecified chronic kidney disease: Secondary | ICD-10-CM | POA: Diagnosis not present

## 2018-08-24 DIAGNOSIS — Z87440 Personal history of urinary (tract) infections: Secondary | ICD-10-CM | POA: Diagnosis not present

## 2018-08-24 DIAGNOSIS — G4730 Sleep apnea, unspecified: Secondary | ICD-10-CM | POA: Diagnosis not present

## 2018-08-24 DIAGNOSIS — I700 Atherosclerosis of aorta: Secondary | ICD-10-CM | POA: Diagnosis not present

## 2018-08-24 DIAGNOSIS — Z7982 Long term (current) use of aspirin: Secondary | ICD-10-CM | POA: Diagnosis not present

## 2018-08-24 DIAGNOSIS — I5042 Chronic combined systolic (congestive) and diastolic (congestive) heart failure: Secondary | ICD-10-CM | POA: Diagnosis not present

## 2018-08-24 DIAGNOSIS — R339 Retention of urine, unspecified: Secondary | ICD-10-CM | POA: Diagnosis not present

## 2018-08-24 DIAGNOSIS — E1122 Type 2 diabetes mellitus with diabetic chronic kidney disease: Secondary | ICD-10-CM | POA: Diagnosis not present

## 2018-08-24 DIAGNOSIS — Z87891 Personal history of nicotine dependence: Secondary | ICD-10-CM | POA: Diagnosis not present

## 2018-08-24 DIAGNOSIS — E11610 Type 2 diabetes mellitus with diabetic neuropathic arthropathy: Secondary | ICD-10-CM | POA: Diagnosis not present

## 2018-08-24 DIAGNOSIS — E1142 Type 2 diabetes mellitus with diabetic polyneuropathy: Secondary | ICD-10-CM | POA: Diagnosis not present

## 2018-08-24 DIAGNOSIS — I429 Cardiomyopathy, unspecified: Secondary | ICD-10-CM | POA: Diagnosis not present

## 2018-08-24 DIAGNOSIS — Z794 Long term (current) use of insulin: Secondary | ICD-10-CM | POA: Diagnosis not present

## 2018-08-24 DIAGNOSIS — I4891 Unspecified atrial fibrillation: Secondary | ICD-10-CM | POA: Diagnosis not present

## 2018-08-25 ENCOUNTER — Telehealth: Payer: Self-pay | Admitting: Family Medicine

## 2018-08-25 DIAGNOSIS — Z794 Long term (current) use of insulin: Secondary | ICD-10-CM | POA: Diagnosis not present

## 2018-08-25 DIAGNOSIS — G4730 Sleep apnea, unspecified: Secondary | ICD-10-CM | POA: Diagnosis not present

## 2018-08-25 DIAGNOSIS — Z87440 Personal history of urinary (tract) infections: Secondary | ICD-10-CM | POA: Diagnosis not present

## 2018-08-25 DIAGNOSIS — E1142 Type 2 diabetes mellitus with diabetic polyneuropathy: Secondary | ICD-10-CM | POA: Diagnosis not present

## 2018-08-25 DIAGNOSIS — I700 Atherosclerosis of aorta: Secondary | ICD-10-CM | POA: Diagnosis not present

## 2018-08-25 DIAGNOSIS — N183 Chronic kidney disease, stage 3 (moderate): Secondary | ICD-10-CM | POA: Diagnosis not present

## 2018-08-25 DIAGNOSIS — E1122 Type 2 diabetes mellitus with diabetic chronic kidney disease: Secondary | ICD-10-CM | POA: Diagnosis not present

## 2018-08-25 DIAGNOSIS — I429 Cardiomyopathy, unspecified: Secondary | ICD-10-CM | POA: Diagnosis not present

## 2018-08-25 DIAGNOSIS — R339 Retention of urine, unspecified: Secondary | ICD-10-CM | POA: Diagnosis not present

## 2018-08-25 DIAGNOSIS — J449 Chronic obstructive pulmonary disease, unspecified: Secondary | ICD-10-CM | POA: Diagnosis not present

## 2018-08-25 DIAGNOSIS — Z8673 Personal history of transient ischemic attack (TIA), and cerebral infarction without residual deficits: Secondary | ICD-10-CM | POA: Diagnosis not present

## 2018-08-25 DIAGNOSIS — Z466 Encounter for fitting and adjustment of urinary device: Secondary | ICD-10-CM | POA: Diagnosis not present

## 2018-08-25 DIAGNOSIS — Z853 Personal history of malignant neoplasm of breast: Secondary | ICD-10-CM | POA: Diagnosis not present

## 2018-08-25 DIAGNOSIS — I5042 Chronic combined systolic (congestive) and diastolic (congestive) heart failure: Secondary | ICD-10-CM | POA: Diagnosis not present

## 2018-08-25 DIAGNOSIS — E11610 Type 2 diabetes mellitus with diabetic neuropathic arthropathy: Secondary | ICD-10-CM | POA: Diagnosis not present

## 2018-08-25 DIAGNOSIS — Z87891 Personal history of nicotine dependence: Secondary | ICD-10-CM | POA: Diagnosis not present

## 2018-08-25 DIAGNOSIS — I130 Hypertensive heart and chronic kidney disease with heart failure and stage 1 through stage 4 chronic kidney disease, or unspecified chronic kidney disease: Secondary | ICD-10-CM | POA: Diagnosis not present

## 2018-08-25 DIAGNOSIS — Z7982 Long term (current) use of aspirin: Secondary | ICD-10-CM | POA: Diagnosis not present

## 2018-08-25 DIAGNOSIS — I4891 Unspecified atrial fibrillation: Secondary | ICD-10-CM | POA: Diagnosis not present

## 2018-08-25 NOTE — Telephone Encounter (Signed)
Pt is requesting her Tramadol be increased to 2 or 3 times a day.

## 2018-08-26 DIAGNOSIS — R339 Retention of urine, unspecified: Secondary | ICD-10-CM | POA: Diagnosis not present

## 2018-08-26 DIAGNOSIS — N183 Chronic kidney disease, stage 3 (moderate): Secondary | ICD-10-CM | POA: Diagnosis not present

## 2018-08-26 DIAGNOSIS — Z8673 Personal history of transient ischemic attack (TIA), and cerebral infarction without residual deficits: Secondary | ICD-10-CM | POA: Diagnosis not present

## 2018-08-26 DIAGNOSIS — Z853 Personal history of malignant neoplasm of breast: Secondary | ICD-10-CM | POA: Diagnosis not present

## 2018-08-26 DIAGNOSIS — I4891 Unspecified atrial fibrillation: Secondary | ICD-10-CM | POA: Diagnosis not present

## 2018-08-26 DIAGNOSIS — I429 Cardiomyopathy, unspecified: Secondary | ICD-10-CM | POA: Diagnosis not present

## 2018-08-26 DIAGNOSIS — G4730 Sleep apnea, unspecified: Secondary | ICD-10-CM | POA: Diagnosis not present

## 2018-08-26 DIAGNOSIS — Z466 Encounter for fitting and adjustment of urinary device: Secondary | ICD-10-CM | POA: Diagnosis not present

## 2018-08-26 DIAGNOSIS — I700 Atherosclerosis of aorta: Secondary | ICD-10-CM | POA: Diagnosis not present

## 2018-08-26 DIAGNOSIS — E11610 Type 2 diabetes mellitus with diabetic neuropathic arthropathy: Secondary | ICD-10-CM | POA: Diagnosis not present

## 2018-08-26 DIAGNOSIS — Z87891 Personal history of nicotine dependence: Secondary | ICD-10-CM | POA: Diagnosis not present

## 2018-08-26 DIAGNOSIS — Z87440 Personal history of urinary (tract) infections: Secondary | ICD-10-CM | POA: Diagnosis not present

## 2018-08-26 DIAGNOSIS — Z794 Long term (current) use of insulin: Secondary | ICD-10-CM | POA: Diagnosis not present

## 2018-08-26 DIAGNOSIS — I5042 Chronic combined systolic (congestive) and diastolic (congestive) heart failure: Secondary | ICD-10-CM | POA: Diagnosis not present

## 2018-08-26 DIAGNOSIS — Z7982 Long term (current) use of aspirin: Secondary | ICD-10-CM | POA: Diagnosis not present

## 2018-08-26 DIAGNOSIS — E1122 Type 2 diabetes mellitus with diabetic chronic kidney disease: Secondary | ICD-10-CM | POA: Diagnosis not present

## 2018-08-26 DIAGNOSIS — E1142 Type 2 diabetes mellitus with diabetic polyneuropathy: Secondary | ICD-10-CM | POA: Diagnosis not present

## 2018-08-26 DIAGNOSIS — I130 Hypertensive heart and chronic kidney disease with heart failure and stage 1 through stage 4 chronic kidney disease, or unspecified chronic kidney disease: Secondary | ICD-10-CM | POA: Diagnosis not present

## 2018-08-26 DIAGNOSIS — J449 Chronic obstructive pulmonary disease, unspecified: Secondary | ICD-10-CM | POA: Diagnosis not present

## 2018-08-29 ENCOUNTER — Ambulatory Visit (HOSPITAL_COMMUNITY)
Admission: RE | Admit: 2018-08-29 | Discharge: 2018-08-29 | Disposition: A | Discharge status: Home / Self Care | Payer: Medicare Other | Source: Ambulatory Visit | Attending: Urology | Admitting: Urology

## 2018-08-29 DIAGNOSIS — N130 Hydronephrosis with ureteropelvic junction obstruction: Secondary | ICD-10-CM | POA: Diagnosis not present

## 2018-08-29 DIAGNOSIS — N1339 Other hydronephrosis: Secondary | ICD-10-CM | POA: Diagnosis not present

## 2018-08-29 NOTE — Telephone Encounter (Signed)
sHE HAS BEEN REFERRED FOR PAIN MANAGEMENT TO dR dOONQUAH, SHE HAS BEEN IN THE PAST GETTING PAIN MEDCIS FRON HER FOOT dOC, SHE NEEDS TO CONTACT ONE OR THE OTHER FOR PAIN MANGEMENT, I WILL REFER HER AGAIN TO DR DOONQUAH FOR PAIN MAANGEMENT IF NEED BE

## 2018-08-30 ENCOUNTER — Telehealth: Payer: Self-pay | Admitting: Family Medicine

## 2018-08-30 ENCOUNTER — Ambulatory Visit (INDEPENDENT_AMBULATORY_CARE_PROVIDER_SITE_OTHER): Payer: Self-pay | Admitting: Orthopedic Surgery

## 2018-08-30 ENCOUNTER — Ambulatory Visit (INDEPENDENT_AMBULATORY_CARE_PROVIDER_SITE_OTHER): Payer: Medicare Other | Admitting: Urology

## 2018-08-30 DIAGNOSIS — N130 Hydronephrosis with ureteropelvic junction obstruction: Secondary | ICD-10-CM | POA: Diagnosis not present

## 2018-08-30 NOTE — Telephone Encounter (Signed)
Thank you :)

## 2018-08-30 NOTE — Telephone Encounter (Signed)
I see historic duoneb on her list. Ok to refill?

## 2018-08-30 NOTE — Telephone Encounter (Signed)
Pt came in the office today asking about the medication, I advised the pt of Dr Camillia Herter comments. Pt states she was in the hosp in Aug when her Appt was with [facility], and she wasn't aware, pt did not follow up and Doonquahs office didn't follow up.  I called [facility] office, made an appt for Pt for Nov 22nd @ 11:15.   Can you call her in something until she can get into see [place]. I have STRESSED the importance to keep this appointment

## 2018-08-30 NOTE — Telephone Encounter (Signed)
Pt needs medication called in for Nebulizer --To [facility]

## 2018-08-31 DIAGNOSIS — E1142 Type 2 diabetes mellitus with diabetic polyneuropathy: Secondary | ICD-10-CM

## 2018-08-31 DIAGNOSIS — I4891 Unspecified atrial fibrillation: Secondary | ICD-10-CM

## 2018-08-31 DIAGNOSIS — N183 Chronic kidney disease, stage 3 (moderate): Secondary | ICD-10-CM | POA: Diagnosis not present

## 2018-08-31 DIAGNOSIS — R339 Retention of urine, unspecified: Secondary | ICD-10-CM

## 2018-08-31 DIAGNOSIS — I130 Hypertensive heart and chronic kidney disease with heart failure and stage 1 through stage 4 chronic kidney disease, or unspecified chronic kidney disease: Secondary | ICD-10-CM | POA: Diagnosis not present

## 2018-08-31 DIAGNOSIS — E1122 Type 2 diabetes mellitus with diabetic chronic kidney disease: Secondary | ICD-10-CM | POA: Diagnosis not present

## 2018-08-31 DIAGNOSIS — I5042 Chronic combined systolic (congestive) and diastolic (congestive) heart failure: Secondary | ICD-10-CM | POA: Diagnosis not present

## 2018-09-05 ENCOUNTER — Encounter: Payer: Self-pay | Admitting: Family Medicine

## 2018-09-05 ENCOUNTER — Telehealth: Payer: Self-pay | Admitting: Family Medicine

## 2018-09-05 ENCOUNTER — Ambulatory Visit: Payer: Medicare Other | Admitting: Family Medicine

## 2018-09-05 ENCOUNTER — Ambulatory Visit (INDEPENDENT_AMBULATORY_CARE_PROVIDER_SITE_OTHER): Payer: Medicare Other | Admitting: Family Medicine

## 2018-09-05 VITALS — BP 141/97 | HR 86 | Resp 12 | Ht 61.0 in | Wt 158.0 lb

## 2018-09-05 DIAGNOSIS — E1159 Type 2 diabetes mellitus with other circulatory complications: Secondary | ICD-10-CM

## 2018-09-05 DIAGNOSIS — I10 Essential (primary) hypertension: Secondary | ICD-10-CM

## 2018-09-05 DIAGNOSIS — E559 Vitamin D deficiency, unspecified: Secondary | ICD-10-CM

## 2018-09-05 DIAGNOSIS — N183 Chronic kidney disease, stage 3 unspecified: Secondary | ICD-10-CM

## 2018-09-05 DIAGNOSIS — F332 Major depressive disorder, recurrent severe without psychotic features: Secondary | ICD-10-CM

## 2018-09-05 MED ORDER — AMLODIPINE BESYLATE 10 MG PO TABS: 10.0000 mg | ORAL_TABLET | Freq: Every day | ORAL | 5 refills | Status: DC

## 2018-09-05 MED ORDER — ERGOCALCIFEROL 1.25 MG (50000 UT) PO CAPS: 50000.0000 [IU] | ORAL_CAPSULE | ORAL | 1 refills | Status: DC

## 2018-09-05 NOTE — Progress Notes (Signed)
Fill Date ID Written Drug Qty Days Prescriber Rx # Pharmacy Refill Daily Dose * Pymt Type PMP  08/12/2018  3   05/05/2018  Tramadol Hcl 50 Mg Tablet  30.00 30 Ma Sim  [id number]  Rxc ([id number])  0/1 5.00 MME Medicare  [place]  07/16/2018  3   05/05/2018  Tramadol Hcl 50 Mg Tablet  30.00 30 Ma Sim  [id number]  Car (7078)  0/2 5.00 MME Medicare  [place]  06/01/2018  3   05/05/2018  Tramadol Hcl 50 Mg Tablet  30.00 30 Ma Sim  [id number]  Rxc (9167)  1/4 5.00 MME Medicare  [place]  05/06/2018  3   05/05/2018  Tramadol Hcl 50 Mg Tablet  30.00 30 Ma Sim  [id number]  Rxc (9167)  0/4 5.00 MME Medicare  [place]  04/04/2018  3   04/04/2018  Oxycodone-Acetaminophen 5-325  20.00 7 Ma Dud  [id number]  Car (9744)  0/0 21.43 MME Comm Ins  [place]  04/04/2018  3   04/01/2018  Tramadol Hcl 50 Mg Tablet  30.00 30 Ma Sim  [id number]  Rxc (3094)  0/0 5.00 MME Medicare  [place]  03/22/2018  3   03/15/2018  Oxycodone-Acetaminophen 5-325  20.00 7 Ma Dud  [id number]  Car (1031)  0/0 21.43 MME Comm Ins  [place]  03/01/2018  3   02/28/2018  Oxycodone-Acetaminophen 5-325  30.00 10 Ma Dud  [id number]  Car (9744)  0/0 22.50 MME Comm Ins  [place]  02/15/2018  3   02/10/2018  Oxycodone-Acetaminophen 5-325  30.00 10 Ma Dud  [id number]  Rxc (9167)  0/0 22.50 MME Medicare  [place]  01/20/2018  3   01/20/2018  Oxycodone-Acetaminophen 5-325  30.00 10 Ma Dud  [id number]  Car (5790)  0/0 22.50 MME Medicare  [place]  01/11/2018  3   01/10/2018  Oxycodone-Acetaminophen 5-325  30.00 5 Ri Aro  [id number]  Car (9191)  0/0 45.00 MME Medicare  [place]  09/15/2017  2   09/14/2017  Tramadol Hcl 50 Mg Tablet  12.00 3 Jo Lon  [id number]  Car (9744)  0/0 20.00 MME Medicare  [place]  09/06/2017  2   09/06/2017  Tramadol Hcl 50 Mg Tablet  23.00 23 Ma Sim  [id number]  Rxc (3953)  0/2 5.00 MME Medicare  [place]  08/28/2017  2   08/26/2017  Oxycodone-Acetaminophen 5-325  14.00 4 Ar Las  [id number]  Car [phone])  0/0

## 2018-09-05 NOTE — Assessment & Plan Note (Addendum)
Hospital and Nursing home records reviewed with patient Blood pressure is el;evated and medication adjusted.\ Updated hBA1C is normal and her kidney function is impaired.cBC is normal

## 2018-09-05 NOTE — Telephone Encounter (Signed)
Yes pls refill

## 2018-09-05 NOTE — Patient Instructions (Addendum)
F/U with MD in mid January , call if you need me before  [facility] with nurse due April 2020-  Fasting lipid, cmp and eGFR, HBA1c and Vit D 1 week before follow up after Jan 11  Start weekly vit D , I have prescribed  Amlodipine dose is increased to 10 mg daily  I will check on tramadol, if none will prescribe 1 at bedtime until you see Neurology  Thank you  for choosing [facility]. We consider it a privelige to serve you.  Delivering excellent health care in a caring and  compassionate way is our goal.  Partnering with you,  so that together we can achieve this goal is our strategy.

## 2018-09-05 NOTE — Assessment & Plan Note (Signed)
After obtaining informed consent, the vaccine is  administered by LPN.  

## 2018-09-05 NOTE — Telephone Encounter (Signed)
Per VM- will be discharged as patient has Refused Both appointments.

## 2018-09-05 NOTE — Assessment & Plan Note (Signed)
Uncontrolled , dose increase in medciation and f/u in 6 to 8 weeks DASH diet and commitment to daily physical activity for a minimum of 30 minutes discussed and encouraged, as a part of hypertension management. The importance of attaining a healthy weight is also discussed.  BP/Weight 07/26/2018 07/14/2018 07/11/2018 06/20/2018 06/15/2018 2/56/1548 05/29/5732  Systolic BP 448 301 - 599 689 - 570  Diastolic BP 98 220 - 88 96 - 80  Wt. (Lbs) 159 - 169 - - 165.79 152.12  BMI 30.04 - 31.93 - - 31.32 28.74  Some encounter information is confidential and restricted. Go to Review Flowsheets activity to see all data.

## 2018-09-05 NOTE — Addendum Note (Signed)
Addended by: Tula Nakayama E on: 09/05/2018 12:53 PM   Modules accepted: Orders

## 2018-09-05 NOTE — Assessment & Plan Note (Signed)
PHQ 9 score of 21 on 07/26/2018, not suicidal or homicidal Increase fluoxetine to 40 mg and refer to psychiatry

## 2018-09-06 ENCOUNTER — Other Ambulatory Visit: Payer: Self-pay

## 2018-09-06 ENCOUNTER — Telehealth: Payer: Self-pay | Admitting: Family Medicine

## 2018-09-06 ENCOUNTER — Other Ambulatory Visit: Payer: Self-pay | Admitting: Family Medicine

## 2018-09-06 ENCOUNTER — Telehealth: Payer: Self-pay | Admitting: *Deleted

## 2018-09-06 MED ORDER — IPRATROPIUM-ALBUTEROL 0.5-2.5 (3) MG/3ML IN SOLN: 3.0000 mL | Freq: Four times a day (QID) | RESPIRATORY_TRACT | 1 refills | Status: DC | PRN

## 2018-09-06 MED ORDER — TRAMADOL HCL 50 MG PO TABS: ORAL_TABLET | ORAL | 0 refills | Status: DC

## 2018-09-06 NOTE — Telephone Encounter (Signed)
Pt LVM about medicines that were called in yesterday. Medicines are not covered by medicaid and she needs something called in that will be covered by medicaid.

## 2018-09-06 NOTE — Telephone Encounter (Signed)
Refill x1 

## 2018-09-06 NOTE — Telephone Encounter (Signed)
Spoke with [name] to clarify message, she states that she has been taking the Tramadol 2-3 times a day and is now out of medicine, and [facility] will not fill it again until Thursday, when its due. She wants you to rewrite the prescription to state that she can take it 2-3 times per day, so she can get it now. Then she states that medicare will not pay for the vitamin D, and wants a substitute for that so Medicare will pay for it.

## 2018-09-06 NOTE — Telephone Encounter (Signed)
I spoke with the patient she is aware that tramadol is sent for 1 month

## 2018-09-06 NOTE — Telephone Encounter (Signed)
[name] is calling requesting a refill on traMADol (ULTRAM) 50 MG tablet  Please advise?

## 2018-09-07 DIAGNOSIS — G4730 Sleep apnea, unspecified: Secondary | ICD-10-CM | POA: Diagnosis not present

## 2018-09-07 DIAGNOSIS — E1142 Type 2 diabetes mellitus with diabetic polyneuropathy: Secondary | ICD-10-CM | POA: Diagnosis not present

## 2018-09-07 DIAGNOSIS — I700 Atherosclerosis of aorta: Secondary | ICD-10-CM | POA: Diagnosis not present

## 2018-09-07 DIAGNOSIS — Z853 Personal history of malignant neoplasm of breast: Secondary | ICD-10-CM | POA: Diagnosis not present

## 2018-09-07 DIAGNOSIS — J449 Chronic obstructive pulmonary disease, unspecified: Secondary | ICD-10-CM | POA: Diagnosis not present

## 2018-09-07 DIAGNOSIS — N183 Chronic kidney disease, stage 3 (moderate): Secondary | ICD-10-CM | POA: Diagnosis not present

## 2018-09-07 DIAGNOSIS — Z8673 Personal history of transient ischemic attack (TIA), and cerebral infarction without residual deficits: Secondary | ICD-10-CM | POA: Diagnosis not present

## 2018-09-07 DIAGNOSIS — E1122 Type 2 diabetes mellitus with diabetic chronic kidney disease: Secondary | ICD-10-CM | POA: Diagnosis not present

## 2018-09-07 DIAGNOSIS — I5042 Chronic combined systolic (congestive) and diastolic (congestive) heart failure: Secondary | ICD-10-CM | POA: Diagnosis not present

## 2018-09-07 DIAGNOSIS — I429 Cardiomyopathy, unspecified: Secondary | ICD-10-CM | POA: Diagnosis not present

## 2018-09-07 DIAGNOSIS — E11610 Type 2 diabetes mellitus with diabetic neuropathic arthropathy: Secondary | ICD-10-CM | POA: Diagnosis not present

## 2018-09-07 DIAGNOSIS — Z87891 Personal history of nicotine dependence: Secondary | ICD-10-CM | POA: Diagnosis not present

## 2018-09-07 DIAGNOSIS — Z466 Encounter for fitting and adjustment of urinary device: Secondary | ICD-10-CM | POA: Diagnosis not present

## 2018-09-07 DIAGNOSIS — I130 Hypertensive heart and chronic kidney disease with heart failure and stage 1 through stage 4 chronic kidney disease, or unspecified chronic kidney disease: Secondary | ICD-10-CM | POA: Diagnosis not present

## 2018-09-07 DIAGNOSIS — Z7982 Long term (current) use of aspirin: Secondary | ICD-10-CM | POA: Diagnosis not present

## 2018-09-07 DIAGNOSIS — R339 Retention of urine, unspecified: Secondary | ICD-10-CM | POA: Diagnosis not present

## 2018-09-07 DIAGNOSIS — Z87440 Personal history of urinary (tract) infections: Secondary | ICD-10-CM | POA: Diagnosis not present

## 2018-09-07 DIAGNOSIS — Z794 Long term (current) use of insulin: Secondary | ICD-10-CM | POA: Diagnosis not present

## 2018-09-07 DIAGNOSIS — I4891 Unspecified atrial fibrillation: Secondary | ICD-10-CM | POA: Diagnosis not present

## 2018-09-07 NOTE — Telephone Encounter (Signed)
Patient was upset because you will not prescribe more pain medication. Stated "I cant believe she is only gonna give me one little M&M for my pain, I'm sorry but I'm gonna find someone else to give me something to help my pain!" I asked her if she wanted a referral to a pain specialist and patient hung up on me.

## 2018-09-07 NOTE — Telephone Encounter (Signed)
PLEASE EXPLAIN TO PT THAT I REVIEWED HOW I HAVE BEEN PRESCRIBING AND HOW SHE HAS BEEN FILLING THE TRAMADOL THAT I PRESCRIBE. tHIS IS one DAILY, AND THIS IS HOW I AM KEEPING THE PRESCRIPTION, WHICH IS WHY SHE NEEDS TO SEE A PAIN SPECIALIST

## 2018-09-07 NOTE — Telephone Encounter (Signed)
noted 

## 2018-09-08 ENCOUNTER — Other Ambulatory Visit: Payer: Self-pay | Admitting: Family Medicine

## 2018-09-08 DIAGNOSIS — I429 Cardiomyopathy, unspecified: Secondary | ICD-10-CM | POA: Diagnosis not present

## 2018-09-08 DIAGNOSIS — I4891 Unspecified atrial fibrillation: Secondary | ICD-10-CM | POA: Diagnosis not present

## 2018-09-08 DIAGNOSIS — I130 Hypertensive heart and chronic kidney disease with heart failure and stage 1 through stage 4 chronic kidney disease, or unspecified chronic kidney disease: Secondary | ICD-10-CM | POA: Diagnosis not present

## 2018-09-08 DIAGNOSIS — N183 Chronic kidney disease, stage 3 (moderate): Secondary | ICD-10-CM | POA: Diagnosis not present

## 2018-09-08 DIAGNOSIS — E1122 Type 2 diabetes mellitus with diabetic chronic kidney disease: Secondary | ICD-10-CM | POA: Diagnosis not present

## 2018-09-08 DIAGNOSIS — Z7982 Long term (current) use of aspirin: Secondary | ICD-10-CM | POA: Diagnosis not present

## 2018-09-08 DIAGNOSIS — Z466 Encounter for fitting and adjustment of urinary device: Secondary | ICD-10-CM | POA: Diagnosis not present

## 2018-09-08 DIAGNOSIS — Z853 Personal history of malignant neoplasm of breast: Secondary | ICD-10-CM | POA: Diagnosis not present

## 2018-09-08 DIAGNOSIS — I700 Atherosclerosis of aorta: Secondary | ICD-10-CM | POA: Diagnosis not present

## 2018-09-08 DIAGNOSIS — E1142 Type 2 diabetes mellitus with diabetic polyneuropathy: Secondary | ICD-10-CM | POA: Diagnosis not present

## 2018-09-08 DIAGNOSIS — Z8673 Personal history of transient ischemic attack (TIA), and cerebral infarction without residual deficits: Secondary | ICD-10-CM | POA: Diagnosis not present

## 2018-09-08 DIAGNOSIS — E11610 Type 2 diabetes mellitus with diabetic neuropathic arthropathy: Secondary | ICD-10-CM | POA: Diagnosis not present

## 2018-09-08 DIAGNOSIS — I5042 Chronic combined systolic (congestive) and diastolic (congestive) heart failure: Secondary | ICD-10-CM | POA: Diagnosis not present

## 2018-09-08 DIAGNOSIS — Z794 Long term (current) use of insulin: Secondary | ICD-10-CM | POA: Diagnosis not present

## 2018-09-08 DIAGNOSIS — G4730 Sleep apnea, unspecified: Secondary | ICD-10-CM | POA: Diagnosis not present

## 2018-09-08 DIAGNOSIS — Z87440 Personal history of urinary (tract) infections: Secondary | ICD-10-CM | POA: Diagnosis not present

## 2018-09-08 DIAGNOSIS — R339 Retention of urine, unspecified: Secondary | ICD-10-CM | POA: Diagnosis not present

## 2018-09-08 DIAGNOSIS — Z87891 Personal history of nicotine dependence: Secondary | ICD-10-CM | POA: Diagnosis not present

## 2018-09-08 DIAGNOSIS — J449 Chronic obstructive pulmonary disease, unspecified: Secondary | ICD-10-CM | POA: Diagnosis not present

## 2018-09-12 DIAGNOSIS — I509 Heart failure, unspecified: Secondary | ICD-10-CM | POA: Diagnosis not present

## 2018-09-15 ENCOUNTER — Ambulatory Visit (INDEPENDENT_AMBULATORY_CARE_PROVIDER_SITE_OTHER): Payer: Medicare Other | Admitting: Physician Assistant

## 2018-09-15 ENCOUNTER — Encounter (INDEPENDENT_AMBULATORY_CARE_PROVIDER_SITE_OTHER): Payer: Self-pay | Admitting: Orthopedic Surgery

## 2018-09-15 VITALS — Ht 61.0 in | Wt 158.0 lb

## 2018-09-15 DIAGNOSIS — I87323 Chronic venous hypertension (idiopathic) with inflammation of bilateral lower extremity: Secondary | ICD-10-CM

## 2018-09-15 DIAGNOSIS — B351 Tinea unguium: Secondary | ICD-10-CM | POA: Diagnosis not present

## 2018-09-15 DIAGNOSIS — Z945 Skin transplant status: Secondary | ICD-10-CM | POA: Diagnosis not present
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
## 2018-09-16 ENCOUNTER — Encounter (INDEPENDENT_AMBULATORY_CARE_PROVIDER_SITE_OTHER): Payer: Self-pay | Admitting: Physician Assistant

## 2018-09-16 DIAGNOSIS — R2689 Other abnormalities of gait and mobility: Secondary | ICD-10-CM | POA: Diagnosis not present

## 2018-09-16 DIAGNOSIS — M545 Low back pain: Secondary | ICD-10-CM | POA: Diagnosis not present

## 2018-09-16 DIAGNOSIS — I10 Essential (primary) hypertension: Secondary | ICD-10-CM | POA: Diagnosis not present

## 2018-09-16 DIAGNOSIS — Z79891 Long term (current) use of opiate analgesic: Secondary | ICD-10-CM | POA: Diagnosis not present

## 2018-09-16 DIAGNOSIS — M79673 Pain in unspecified foot: Secondary | ICD-10-CM | POA: Diagnosis not present

## 2018-09-20 DIAGNOSIS — R338 Other retention of urine: Secondary | ICD-10-CM | POA: Diagnosis not present

## 2018-09-21 ENCOUNTER — Telehealth: Payer: Self-pay | Admitting: Family Medicine

## 2018-09-21 NOTE — Assessment & Plan Note (Signed)
Marked improvement on medication, still needs Psych management longterm

## 2018-09-21 NOTE — Assessment & Plan Note (Signed)
[name] is reminded of the importance of commitment to daily physical activity for 30 minutes or more, as able and the need to limit carbohydrate intake to 30 to 60 grams per meal to help with blood sugar control.     [name] is reminded of the importance of daily foot exam, annual eye examination, and good blood sugar, blood pressure and cholesterol control.  Diabetic Labs Latest Ref Rng & Units 07/26/2018 07/13/2018 07/12/2018 07/11/2018 06/20/2018  HbA1c <5.7 % of total Hgb 5.3 - - - -  Microalbumin Not estab mg/dL - - - - -  Micro/Creat Ratio <30 mcg/mg creat - - - - -  Chol <200 mg/dL - - - - -  HDL >50 mg/dL - - - - -  Calc LDL mg/dL (calc) - - - - -  Triglycerides <150 mg/dL - - - - -  Creatinine 0.50 - 1.05 mg/dL 1.56(H) 1.38(H) 2.20(H) 2.39(H) 0.93  GFR >60.00 mL/min - - - - -   BP/Weight 09/15/2018 09/05/2018 07/26/2018 07/14/2018 07/11/2018 06/20/2018 6/77/0340  Systolic BP - 352 481 859 - 093 112  Diastolic BP - 97 98 162 - 88 96  Wt. (Lbs) 158 158 159 - 169 - -  BMI 29.85 29.85 30.04 - 31.93 - -  Some encounter information is confidential and restricted. Go to Review Flowsheets activity to see all data.   Foot/eye exam completion dates Latest Ref Rng & Units 07/26/2018 03/16/2017  Eye Exam No Retinopathy - Retinopathy(A)  Foot exam Order - - -  Foot Form Completion - Done -

## 2018-09-21 NOTE — Telephone Encounter (Signed)
Please let pt knoew that she will need to Psychiatrist other than at [facility]. After record review MD sttaes she will not accept her as a pt, she will need to call [facility] for appt, we do not refer

## 2018-09-21 NOTE — Assessment & Plan Note (Signed)
Uncontrolled but improved, increase dose of clonidine DASH diet and commitment to daily physical activity for a minimum of 30 minutes discussed and encouraged, as a part of hypertension management. The importance of attaining a healthy weight is also discussed.  BP/Weight 09/15/2018 09/05/2018 07/26/2018 07/14/2018 07/11/2018 06/20/2018 4/45/8483  Systolic BP - 507 573 225 - 672 091  Diastolic BP - 97 98 980 - 88 96  Wt. (Lbs) 158 158 159 - 169 - -  BMI 29.85 29.85 30.04 - 31.93 - -  Some encounter information is confidential and restricted. Go to Review Flowsheets activity to see all data.

## 2018-09-21 NOTE — Progress Notes (Signed)
[name]     MRN: [id number]      DOB: Jun 25, 1966   HPI [name] is here for follow up and re-evaluation of chronic medical conditions, medication management and review of any available recent lab and radiology data.  Preventive health is updated, specifically  Cancer screening and Immunization.   Questions or concerns regarding consultations or procedures which the PT has had in the interim are  addressed. The PT denies any adverse reactions to current medications since the last visit.  C/o chronic and I uncontrolled back pain, understands she nmeeds pain management and is requesting medication to tide her through until that appointment Denies polyuria, polydipsia, blurred vision , or hypoglycemic episodes.   ROS Denies recent fever or chills. Denies sinus pressure, nasal congestion, ear pain or sore throat. Denies chest congestion, productive cough or wheezing. Denies chest pains, palpitations and leg swelling Denies abdominal pain, nausea, vomiting,diarrhea or constipation.   Denies dysuria, frequency, hesitancy or incontinence.  Denies headaches, seizures, numbness, or tingling. Denies uncontrolled  depression, anxiety or insomnia.much improved on medication but still needs and wants treatment through Psych. Not suicidal or homicidal, and denies drug use in past 1 yearDenies skin break down or rash.   PE  BP (!) 141/97 (BP Location: Right Arm, Patient Position: Sitting, Cuff Size: Normal)   Pulse 86   Resp 12   Ht 5\' 1"  (1.549 m)   Wt 158 lb (71.7 kg)   LMP 05/19/2016   SpO2 94%   BMI 29.85 kg/m   Patient alert and oriented and in no cardiopulmonary distress.  HEENT: No facial asymmetry, EOMI,   oropharynx pink and moist.  Neck supple no JVD, no mass.  Chest: Clear to auscultation bilaterally.decreased air entry  CVS: S1, S2 no murmurs, no S3.Regular rate.  ABD: Soft non tender.   Ext: No edema  MS: Decreased ROM spine, shoulders, hips and  knees.  Skin: Intact, no ulcerations or rash noted.  Psych: Good eye contact, normal affect. Memory intact not anxious or depressed appearing.  CNS: CN 2-12 intact, power,  normal throughout.no focal deficits noted.   Assessment & Plan  HTN (hypertension), malignant Uncontrolled but improved, increase dose of clonidine DASH diet and commitment to daily physical activity for a minimum of 30 minutes discussed and encouraged, as a part of hypertension management. The importance of attaining a healthy weight is also discussed.  BP/Weight 09/15/2018 09/05/2018 07/26/2018 07/14/2018 07/11/2018 06/20/2018 6/44/0347  Systolic BP - 425 956 387 - 564 332  Diastolic BP - 97 98 951 - 88 96  Wt. (Lbs) 158 158 159 - 169 - -  BMI 29.85 29.85 30.04 - 31.93 - -  Some encounter information is confidential and restricted. Go to Review Flowsheets activity to see all data.       Severe recurrent major depression without psychotic features ([place]) Marked improvement on medication, still needs Psych management longterm  Type 2 diabetes mellitus with vascular disease ([place]) [name] is reminded of the importance of commitment to daily physical activity for 30 minutes or more, as able and the need to limit carbohydrate intake to 30 to 60 grams per meal to help with blood sugar control.     [name] is reminded of the importance of daily foot exam, annual eye examination, and good blood sugar, blood pressure and cholesterol control.  Diabetic Labs Latest Ref Rng & Units 07/26/2018 07/13/2018 07/12/2018 07/11/2018 06/20/2018  HbA1c <5.7 % of total Hgb 5.3 - - - -  Microalbumin Not estab mg/dL - - - - -  Micro/Creat Ratio <30 mcg/mg creat - - - - -  Chol <200 mg/dL - - - - -  HDL >50 mg/dL - - - - -  Calc LDL mg/dL (calc) - - - - -  Triglycerides <150 mg/dL - - - - -  Creatinine 0.50 - 1.05 mg/dL 1.56(H) 1.38(H) 2.20(H) 2.39(H) 0.93  GFR >60.00 mL/min - - - - -   BP/Weight 09/15/2018 09/05/2018 07/26/2018  07/14/2018 07/11/2018 06/20/2018 6/76/7209  Systolic BP - 470 962 836 - 629 476  Diastolic BP - 97 98 546 - 88 96  Wt. (Lbs) 158 158 159 - 169 - -  BMI 29.85 29.85 30.04 - 31.93 - -  Some encounter information is confidential and restricted. Go to Review Flowsheets activity to see all data.   Foot/eye exam completion dates Latest Ref Rng & Units 07/26/2018 03/16/2017  Eye Exam No Retinopathy - Retinopathy(A)  Foot exam Order - - -  Foot Form Completion - Done -

## 2018-09-26 NOTE — Telephone Encounter (Signed)
States she needs help with bathing, dressing, housekeeping, grocery shopping, transportation to appointments, she is in wheelchair, also help preparing food

## 2018-09-26 NOTE — Telephone Encounter (Signed)
pls specifically ask what her needs ae and document why she is requesting te service for me to ascertain tha she qualifies, thanks

## 2018-09-26 NOTE — Telephone Encounter (Signed)
Called patient and let her know about [employer]. She then asked if you would write her a prescription for a home health nurse.

## 2018-09-27 ENCOUNTER — Telehealth: Payer: Self-pay | Admitting: Family Medicine
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
## 2018-09-28 NOTE — Telephone Encounter (Signed)
Pt is calling back wanting a paper filled out by Friday stating stroke symptoms.

## 2018-09-28 NOTE — Telephone Encounter (Signed)
Will need formal evaluation by neuro psychiatry to be evaluated for the symptoms she is reporting, pls explain and refer to psych in [place] for this , local Psych has refused to see her , so that is the response

## 2018-09-29 NOTE — Telephone Encounter (Signed)
This will have to be provided by a psychiatrist. Dr Moshe Cipro will refer her to psych office in [place] if she agrees

## 2018-09-30 DIAGNOSIS — R338 Other retention of urine: Secondary | ICD-10-CM | POA: Diagnosis not present

## 2018-10-03 ENCOUNTER — Other Ambulatory Visit: Payer: Self-pay | Admitting: Family Medicine

## 2018-10-05 NOTE — Telephone Encounter (Signed)
She does not have medicaid to qualify for [facility] services and her medicare will not pay for this. She has been told that we have checked into this and she would have to pay out of pocket unless she has medicaid. She has medicare.

## 2018-10-05 NOTE — Telephone Encounter (Signed)
pls help with this and follow through on the request pt has for assistance at home, thanks

## 2018-10-11 ENCOUNTER — Encounter (HOSPITAL_COMMUNITY)
Admission: EM | Disposition: A | Discharge status: Home Health | Payer: Self-pay | Source: Home / Self Care | Attending: Orthopedic Surgery

## 2018-10-11 ENCOUNTER — Inpatient Hospital Stay (HOSPITAL_COMMUNITY)
Admission: EM | Admit: 2018-10-11 | Discharge: 2018-10-15 | DRG: 580 | Disposition: A | Discharge status: Home Health | Payer: Medicare Other | Attending: Orthopedic Surgery | Admitting: Orthopedic Surgery

## 2018-10-11 ENCOUNTER — Encounter (HOSPITAL_COMMUNITY): Payer: Self-pay | Admitting: Emergency Medicine

## 2018-10-11 ENCOUNTER — Emergency Department (HOSPITAL_COMMUNITY): Payer: Medicare Other

## 2018-10-11 ENCOUNTER — Emergency Department (HOSPITAL_COMMUNITY): Payer: Medicare Other | Admitting: Anesthesiology

## 2018-10-11 ENCOUNTER — Inpatient Hospital Stay (HOSPITAL_COMMUNITY)
Admission: RE | Admit: 2018-10-11 | Payer: Medicare Other | Source: Other Acute Inpatient Hospital | Admitting: Orthopedic Surgery

## 2018-10-11 ENCOUNTER — Ambulatory Visit: Admit: 2018-10-11 | Payer: Medicare Other | Admitting: Orthopedic Surgery

## 2018-10-11 ENCOUNTER — Ambulatory Visit (INDEPENDENT_AMBULATORY_CARE_PROVIDER_SITE_OTHER): Payer: Medicare Other | Admitting: Urology

## 2018-10-11 ENCOUNTER — Other Ambulatory Visit: Payer: Self-pay

## 2018-10-11 DIAGNOSIS — I509 Heart failure, unspecified: Secondary | ICD-10-CM | POA: Diagnosis not present

## 2018-10-11 DIAGNOSIS — S61451A Open bite of right hand, initial encounter: Secondary | ICD-10-CM | POA: Diagnosis present

## 2018-10-11 DIAGNOSIS — I10 Essential (primary) hypertension: Secondary | ICD-10-CM | POA: Diagnosis not present

## 2018-10-11 DIAGNOSIS — Z8249 Family history of ischemic heart disease and other diseases of the circulatory system: Secondary | ICD-10-CM | POA: Diagnosis not present

## 2018-10-11 DIAGNOSIS — Z87891 Personal history of nicotine dependence: Secondary | ICD-10-CM

## 2018-10-11 DIAGNOSIS — N183 Chronic kidney disease, stage 3 unspecified: Secondary | ICD-10-CM | POA: Diagnosis present

## 2018-10-11 DIAGNOSIS — G8929 Other chronic pain: Secondary | ICD-10-CM | POA: Diagnosis present

## 2018-10-11 DIAGNOSIS — Z6832 Body mass index (BMI) 32.0-32.9, adult: Secondary | ICD-10-CM

## 2018-10-11 DIAGNOSIS — Z7901 Long term (current) use of anticoagulants: Secondary | ICD-10-CM

## 2018-10-11 DIAGNOSIS — D631 Anemia in chronic kidney disease: Secondary | ICD-10-CM | POA: Diagnosis present

## 2018-10-11 DIAGNOSIS — Z825 Family history of asthma and other chronic lower respiratory diseases: Secondary | ICD-10-CM

## 2018-10-11 DIAGNOSIS — N1832 Chronic kidney disease, stage 3b: Secondary | ICD-10-CM | POA: Diagnosis present

## 2018-10-11 DIAGNOSIS — I482 Chronic atrial fibrillation, unspecified: Secondary | ICD-10-CM | POA: Diagnosis not present

## 2018-10-11 DIAGNOSIS — Z833 Family history of diabetes mellitus: Secondary | ICD-10-CM | POA: Diagnosis not present

## 2018-10-11 DIAGNOSIS — J449 Chronic obstructive pulmonary disease, unspecified: Secondary | ICD-10-CM | POA: Diagnosis not present

## 2018-10-11 DIAGNOSIS — E1122 Type 2 diabetes mellitus with diabetic chronic kidney disease: Secondary | ICD-10-CM | POA: Diagnosis present

## 2018-10-11 DIAGNOSIS — I130 Hypertensive heart and chronic kidney disease with heart failure and stage 1 through stage 4 chronic kidney disease, or unspecified chronic kidney disease: Secondary | ICD-10-CM | POA: Diagnosis not present

## 2018-10-11 DIAGNOSIS — R0902 Hypoxemia: Secondary | ICD-10-CM | POA: Diagnosis not present

## 2018-10-11 DIAGNOSIS — G4730 Sleep apnea, unspecified: Secondary | ICD-10-CM | POA: Diagnosis present

## 2018-10-11 DIAGNOSIS — I5042 Chronic combined systolic (congestive) and diastolic (congestive) heart failure: Secondary | ICD-10-CM | POA: Diagnosis present

## 2018-10-11 DIAGNOSIS — W540XXA Bitten by dog, initial encounter: Secondary | ICD-10-CM | POA: Diagnosis present

## 2018-10-11 DIAGNOSIS — Z8673 Personal history of transient ischemic attack (TIA), and cerebral infarction without residual deficits: Secondary | ICD-10-CM | POA: Diagnosis not present

## 2018-10-11 DIAGNOSIS — L089 Local infection of the skin and subcutaneous tissue, unspecified: Secondary | ICD-10-CM | POA: Diagnosis not present

## 2018-10-11 DIAGNOSIS — L02511 Cutaneous abscess of right hand: Secondary | ICD-10-CM | POA: Diagnosis not present

## 2018-10-11 DIAGNOSIS — E785 Hyperlipidemia, unspecified: Secondary | ICD-10-CM | POA: Diagnosis not present

## 2018-10-11 DIAGNOSIS — E669 Obesity, unspecified: Secondary | ICD-10-CM | POA: Diagnosis present

## 2018-10-11 DIAGNOSIS — E1159 Type 2 diabetes mellitus with other circulatory complications: Secondary | ICD-10-CM | POA: Diagnosis present

## 2018-10-11 DIAGNOSIS — E11610 Type 2 diabetes mellitus with diabetic neuropathic arthropathy: Secondary | ICD-10-CM | POA: Diagnosis present

## 2018-10-11 DIAGNOSIS — I43 Cardiomyopathy in diseases classified elsewhere: Secondary | ICD-10-CM | POA: Diagnosis not present

## 2018-10-11 DIAGNOSIS — Z803 Family history of malignant neoplasm of breast: Secondary | ICD-10-CM | POA: Diagnosis not present

## 2018-10-11 DIAGNOSIS — F410 Panic disorder [episodic paroxysmal anxiety] without agoraphobia: Secondary | ICD-10-CM | POA: Diagnosis present

## 2018-10-11 DIAGNOSIS — S61254A Open bite of right ring finger without damage to nail, initial encounter: Secondary | ICD-10-CM | POA: Diagnosis not present

## 2018-10-11 DIAGNOSIS — N184 Chronic kidney disease, stage 4 (severe): Secondary | ICD-10-CM | POA: Diagnosis present

## 2018-10-11 DIAGNOSIS — L03011 Cellulitis of right finger: Secondary | ICD-10-CM | POA: Diagnosis not present

## 2018-10-11 DIAGNOSIS — R338 Other retention of urine: Secondary | ICD-10-CM

## 2018-10-11 DIAGNOSIS — M65841 Other synovitis and tenosynovitis, right hand: Secondary | ICD-10-CM | POA: Diagnosis not present

## 2018-10-11 DIAGNOSIS — M65141 Other infective (teno)synovitis, right hand: Secondary | ICD-10-CM | POA: Diagnosis not present

## 2018-10-11 DIAGNOSIS — A280 Pasteurellosis: Secondary | ICD-10-CM | POA: Diagnosis present

## 2018-10-11 DIAGNOSIS — W5581XA Bitten by other mammals, initial encounter: Secondary | ICD-10-CM | POA: Diagnosis not present

## 2018-10-11 DIAGNOSIS — I4891 Unspecified atrial fibrillation: Secondary | ICD-10-CM | POA: Diagnosis not present

## 2018-10-11 DIAGNOSIS — R52 Pain, unspecified: Secondary | ICD-10-CM | POA: Diagnosis not present

## 2018-10-11 DIAGNOSIS — Z7401 Bed confinement status: Secondary | ICD-10-CM | POA: Diagnosis not present

## 2018-10-11 DIAGNOSIS — N179 Acute kidney failure, unspecified: Secondary | ICD-10-CM | POA: Diagnosis present

## 2018-10-11 DIAGNOSIS — W540XXD Bitten by dog, subsequent encounter: Secondary | ICD-10-CM | POA: Diagnosis not present

## 2018-10-11 DIAGNOSIS — S61204A Unspecified open wound of right ring finger without damage to nail, initial encounter: Secondary | ICD-10-CM | POA: Diagnosis not present

## 2018-10-11 DIAGNOSIS — M549 Dorsalgia, unspecified: Secondary | ICD-10-CM | POA: Diagnosis present

## 2018-10-11 DIAGNOSIS — M1990 Unspecified osteoarthritis, unspecified site: Secondary | ICD-10-CM | POA: Diagnosis present

## 2018-10-11 HISTORY — PX: I & D EXTREMITY: SHX5045

## 2018-10-11 LAB — CBC WITH DIFFERENTIAL/PLATELET
Abs Immature Granulocytes: 0.04 10*3/uL (ref 0.00–0.07)
Basophils Absolute: 0.1 10*3/uL (ref 0.0–0.1)
Basophils Relative: 1 %
Eosinophils Absolute: 0.3 10*3/uL (ref 0.0–0.5)
Eosinophils Relative: 3 %
HCT: 40 % (ref 36.0–46.0)
Hemoglobin: 12 g/dL (ref 12.0–15.0)
Immature Granulocytes: 0 %
Lymphocytes Relative: 14 %
Lymphs Abs: 1.4 10*3/uL (ref 0.7–4.0)
MCH: 27.4 pg (ref 26.0–34.0)
MCHC: 30 g/dL (ref 30.0–36.0)
MCV: 91.3 fL (ref 80.0–100.0)
Monocytes Absolute: 0.6 10*3/uL (ref 0.1–1.0)
Monocytes Relative: 6 %
Neutro Abs: 7.4 10*3/uL (ref 1.7–7.7)
Neutrophils Relative %: 76 %
Platelets: 233 10*3/uL (ref 150–400)
RBC: 4.38 MIL/uL (ref 3.87–5.11)
RDW: 15 % (ref 11.5–15.5)
WBC: 9.7 10*3/uL (ref 4.0–10.5)
nRBC: 0 % (ref 0.0–0.2)

## 2018-10-11 LAB — COMPREHENSIVE METABOLIC PANEL
ALT: 30 U/L (ref 0–44)
AST: 21 U/L (ref 15–41)
Albumin: 3.4 g/dL — ABNORMAL LOW (ref 3.5–5.0)
Alkaline Phosphatase: 107 U/L (ref 38–126)
Anion gap: 9 (ref 5–15)
BUN: 26 mg/dL — ABNORMAL HIGH (ref 6–20)
CO2: 23 mmol/L (ref 22–32)
Calcium: 9.5 mg/dL (ref 8.9–10.3)
Chloride: 108 mmol/L (ref 98–111)
Creatinine, Ser: 1.37 mg/dL — ABNORMAL HIGH (ref 0.44–1.00)
GFR calc Af Amer: 51 mL/min — ABNORMAL LOW (ref >60)
GFR calc non Af Amer: 44 mL/min — ABNORMAL LOW (ref >60)
Glucose, Bld: 97 mg/dL (ref 70–99)
Potassium: 4.2 mmol/L (ref 3.5–5.1)
Sodium: 140 mmol/L (ref 135–145)
Total Bilirubin: 0.7 mg/dL (ref 0.3–1.2)
Total Protein: 7.9 g/dL (ref 6.5–8.1)

## 2018-10-11 LAB — GLUCOSE, CAPILLARY: Glucose-Capillary: 85 mg/dL (ref 70–99)

## 2018-10-11 SURGERY — IRRIGATION AND DEBRIDEMENT EXTREMITY
Anesthesia: General | Site: Hand | Laterality: Right

## 2018-10-11 MED ORDER — DOCUSATE SODIUM 100 MG PO CAPS
100.0000 mg | ORAL_CAPSULE | Freq: Two times a day (BID) | ORAL | Status: DC
  Administered 2018-10-12 – 2018-10-15 (×7): 100 mg via ORAL
  Filled 2018-10-11 (×7): qty 1

## 2018-10-11 MED ORDER — BISACODYL 10 MG RE SUPP: 10.0000 mg | Freq: Every day | RECTAL | Status: DC | PRN

## 2018-10-11 MED ORDER — ONDANSETRON HCL 4 MG/2ML IJ SOLN
INTRAMUSCULAR | Status: DC | PRN
  Administered 2018-10-11: 4 mg via INTRAVENOUS

## 2018-10-11 MED ORDER — AMLODIPINE BESYLATE 10 MG PO TABS
10.0000 mg | ORAL_TABLET | Freq: Every day | ORAL | Status: DC
  Administered 2018-10-12 – 2018-10-15 (×4): 10 mg via ORAL
  Filled 2018-10-11 (×4): qty 1

## 2018-10-11 MED ORDER — FENTANYL CITRATE (PF) 250 MCG/5ML IJ SOLN
INTRAMUSCULAR | Status: DC | PRN
  Administered 2018-10-11 (×3): 50 ug via INTRAVENOUS

## 2018-10-11 MED ORDER — PROMETHAZINE HCL 12.5 MG RE SUPP
12.5000 mg | Freq: Four times a day (QID) | RECTAL | Status: DC | PRN
  Filled 2018-10-11: qty 1

## 2018-10-11 MED ORDER — SODIUM CHLORIDE 0.9 % IV SOLN
3.0000 g | INTRAVENOUS | Status: AC
  Administered 2018-10-11: 3 g via INTRAVENOUS
  Filled 2018-10-11 (×2): qty 3

## 2018-10-11 MED ORDER — POTASSIUM CHLORIDE IN NACL 20-0.45 MEQ/L-% IV SOLN
INTRAVENOUS | Status: DC
  Administered 2018-10-12: 02:00:00 via INTRAVENOUS
  Filled 2018-10-11: qty 1000

## 2018-10-11 MED ORDER — EZETIMIBE 10 MG PO TABS
10.0000 mg | ORAL_TABLET | Freq: Every day | ORAL | Status: DC
  Administered 2018-10-12 – 2018-10-15 (×3): 10 mg via ORAL
  Filled 2018-10-11 (×4): qty 1

## 2018-10-11 MED ORDER — ONDANSETRON HCL 4 MG PO TABS: 4.0000 mg | ORAL_TABLET | Freq: Four times a day (QID) | ORAL | Status: DC | PRN

## 2018-10-11 MED ORDER — MIDAZOLAM HCL 2 MG/2ML IJ SOLN
INTRAMUSCULAR | Status: DC | PRN
  Administered 2018-10-11: 1 mg via INTRAVENOUS

## 2018-10-11 MED ORDER — DARIFENACIN HYDROBROMIDE ER 7.5 MG PO TB24
7.5000 mg | ORAL_TABLET | Freq: Every day | ORAL | Status: DC
  Administered 2018-10-12 – 2018-10-15 (×3): 7.5 mg via ORAL
  Filled 2018-10-11 (×4): qty 1

## 2018-10-11 MED ORDER — LACTATED RINGERS IV SOLN
INTRAVENOUS | Status: DC | PRN
  Administered 2018-10-11: 21:00:00 via INTRAVENOUS

## 2018-10-11 MED ORDER — METHOCARBAMOL 500 MG PO TABS
500.0000 mg | ORAL_TABLET | Freq: Four times a day (QID) | ORAL | Status: DC | PRN
  Administered 2018-10-12 (×2): 500 mg via ORAL
  Filled 2018-10-11 (×2): qty 1

## 2018-10-11 MED ORDER — PROPOFOL 10 MG/ML IV BOLUS
INTRAVENOUS | Status: DC | PRN
  Administered 2018-10-11: 100 mg via INTRAVENOUS

## 2018-10-11 MED ORDER — ROSUVASTATIN CALCIUM 5 MG PO TABS
10.0000 mg | ORAL_TABLET | Freq: Every day | ORAL | Status: DC
  Administered 2018-10-12 – 2018-10-15 (×3): 10 mg via ORAL
  Filled 2018-10-11 (×3): qty 2

## 2018-10-11 MED ORDER — LABETALOL HCL 5 MG/ML IV SOLN
10.0000 mg | INTRAVENOUS | Status: DC | PRN
  Administered 2018-10-11: 10 mg via INTRAVENOUS
  Filled 2018-10-11: qty 4

## 2018-10-11 MED ORDER — LIDOCAINE HCL (CARDIAC) PF 100 MG/5ML IV SOSY
PREFILLED_SYRINGE | INTRAVENOUS | Status: DC | PRN
  Administered 2018-10-11: 60 mg via INTRATRACHEAL

## 2018-10-11 MED ORDER — LABETALOL HCL 5 MG/ML IV SOLN
INTRAVENOUS | Status: AC
  Filled 2018-10-11: qty 4

## 2018-10-11 MED ORDER — MORPHINE SULFATE (PF) 2 MG/ML IV SOLN
2.0000 mg | INTRAVENOUS | Status: DC | PRN
  Administered 2018-10-12: 4 mg via INTRAVENOUS
  Administered 2018-10-12 (×2): 2 mg via INTRAVENOUS
  Administered 2018-10-13 – 2018-10-14 (×3): 4 mg via INTRAVENOUS
  Administered 2018-10-15: 2 mg via INTRAVENOUS
  Filled 2018-10-11 (×3): qty 2
  Filled 2018-10-11: qty 1
  Filled 2018-10-11: qty 2
  Filled 2018-10-11: qty 1
  Filled 2018-10-11 (×2): qty 2

## 2018-10-11 MED ORDER — OXYCODONE HCL 5 MG PO TABS
5.0000 mg | ORAL_TABLET | ORAL | Status: DC | PRN
  Administered 2018-10-12 – 2018-10-15 (×11): 10 mg via ORAL
  Filled 2018-10-11 (×11): qty 2

## 2018-10-11 MED ORDER — POLYETHYLENE GLYCOL 3350 17 G PO PACK: 17.0000 g | PACK | Freq: Every day | ORAL | Status: DC | PRN

## 2018-10-11 MED ORDER — FAMOTIDINE 20 MG PO TABS: 20.0000 mg | ORAL_TABLET | Freq: Two times a day (BID) | ORAL | Status: DC | PRN

## 2018-10-11 MED ORDER — ASPIRIN EC 81 MG PO TBEC
81.0000 mg | DELAYED_RELEASE_TABLET | Freq: Every day | ORAL | Status: DC
  Administered 2018-10-12 – 2018-10-15 (×3): 81 mg via ORAL
  Filled 2018-10-11 (×3): qty 1

## 2018-10-11 MED ORDER — VITAMIN C 500 MG PO TABS
1000.0000 mg | ORAL_TABLET | Freq: Every day | ORAL | Status: DC
  Administered 2018-10-12 – 2018-10-15 (×3): 1000 mg via ORAL
  Filled 2018-10-11 (×3): qty 2

## 2018-10-11 MED ORDER — SENNA 8.6 MG PO TABS
1.0000 | ORAL_TABLET | Freq: Two times a day (BID) | ORAL | Status: DC
  Administered 2018-10-12 – 2018-10-15 (×7): 8.6 mg via ORAL
  Filled 2018-10-11 (×6): qty 1

## 2018-10-11 MED ORDER — IPRATROPIUM-ALBUTEROL 0.5-2.5 (3) MG/3ML IN SOLN: 3.0000 mL | Freq: Four times a day (QID) | RESPIRATORY_TRACT | Status: DC | PRN

## 2018-10-11 MED ORDER — MONTELUKAST SODIUM 10 MG PO TABS
10.0000 mg | ORAL_TABLET | Freq: Every day | ORAL | Status: DC
  Administered 2018-10-12 – 2018-10-14 (×4): 10 mg via ORAL
  Filled 2018-10-11 (×4): qty 1

## 2018-10-11 MED ORDER — UMECLIDINIUM BROMIDE 62.5 MCG/INH IN AEPB
1.0000 | INHALATION_SPRAY | Freq: Every day | RESPIRATORY_TRACT | Status: DC
  Administered 2018-10-13: 1 via RESPIRATORY_TRACT
  Filled 2018-10-11: qty 7

## 2018-10-11 MED ORDER — SUCCINYLCHOLINE 20MG/ML (10ML) SYRINGE FOR MEDFUSION PUMP - OPTIME
INTRAMUSCULAR | Status: DC | PRN
  Administered 2018-10-11: 100 mg via INTRAVENOUS

## 2018-10-11 MED ORDER — 0.9 % SODIUM CHLORIDE (POUR BTL) OPTIME
TOPICAL | Status: DC | PRN
  Administered 2018-10-11: 1000 mL

## 2018-10-11 MED ORDER — LINAGLIPTIN 5 MG PO TABS: 5.0000 mg | ORAL_TABLET | Freq: Every day | ORAL | Status: DC

## 2018-10-11 MED ORDER — METHOCARBAMOL 1000 MG/10ML IJ SOLN: 500.0000 mg | Freq: Four times a day (QID) | INTRAVENOUS | Status: DC | PRN

## 2018-10-11 MED ORDER — MAGNESIUM CITRATE PO SOLN: 1.0000 | Freq: Once | ORAL | Status: DC | PRN

## 2018-10-11 MED ORDER — TRAZODONE HCL 50 MG PO TABS
50.0000 mg | ORAL_TABLET | Freq: Every day | ORAL | Status: DC
  Administered 2018-10-12 – 2018-10-14 (×4): 50 mg via ORAL
  Filled 2018-10-11 (×4): qty 1

## 2018-10-11 MED ORDER — ONDANSETRON HCL 4 MG/2ML IJ SOLN
4.0000 mg | Freq: Four times a day (QID) | INTRAMUSCULAR | Status: DC | PRN
  Administered 2018-10-12 – 2018-10-13 (×2): 4 mg via INTRAVENOUS
  Filled 2018-10-11: qty 2

## 2018-10-11 MED ORDER — FENTANYL CITRATE (PF) 100 MCG/2ML IJ SOLN
25.0000 ug | INTRAMUSCULAR | Status: DC | PRN
  Administered 2018-10-13: 100 ug via INTRAVENOUS

## 2018-10-11 SURGICAL SUPPLY — 41 items
BANDAGE ACE 4X5 VEL STRL LF (GAUZE/BANDAGES/DRESSINGS) ×2 IMPLANT
BANDAGE ELASTIC 4 VELCRO ST LF (GAUZE/BANDAGES/DRESSINGS) ×1 IMPLANT
BNDG CONFORM 2 STRL LF (GAUZE/BANDAGES/DRESSINGS) IMPLANT
BNDG GAUZE ELAST 4 BULKY (GAUZE/BANDAGES/DRESSINGS) ×4 IMPLANT
CORDS BIPOLAR (ELECTRODE) ×2 IMPLANT
COVER SURGICAL LIGHT HANDLE (MISCELLANEOUS) ×2 IMPLANT
COVER WAND RF STERILE (DRAPES) ×2 IMPLANT
CUFF TOURNIQUET SINGLE 18IN (TOURNIQUET CUFF) ×2 IMPLANT
CUFF TOURNIQUET SINGLE 24IN (TOURNIQUET CUFF) IMPLANT
DRSG ADAPTIC 3X8 NADH LF (GAUZE/BANDAGES/DRESSINGS) ×2 IMPLANT
GAUZE SPONGE 4X4 12PLY STRL (GAUZE/BANDAGES/DRESSINGS) ×2 IMPLANT
GAUZE XEROFORM 1X8 LF (GAUZE/BANDAGES/DRESSINGS) ×2 IMPLANT
GLOVE BIOGEL M 8.0 STRL (GLOVE) ×2 IMPLANT
GLOVE SS BIOGEL STRL SZ 8 (GLOVE) ×1 IMPLANT
GLOVE SUPERSENSE BIOGEL SZ 8 (GLOVE) ×1
GOWN STRL REUS W/ TWL LRG LVL3 (GOWN DISPOSABLE) ×1 IMPLANT
GOWN STRL REUS W/ TWL XL LVL3 (GOWN DISPOSABLE) ×2 IMPLANT
GOWN STRL REUS W/TWL LRG LVL3 (GOWN DISPOSABLE) ×2
GOWN STRL REUS W/TWL XL LVL3 (GOWN DISPOSABLE) ×4
KIT BASIN OR (CUSTOM PROCEDURE TRAY) ×2 IMPLANT
KIT TURNOVER KIT B (KITS) ×2 IMPLANT
MANIFOLD NEPTUNE II (INSTRUMENTS) ×2 IMPLANT
NDL HYPO 25GX1X1/2 BEV (NEEDLE) IMPLANT
NEEDLE HYPO 25GX1X1/2 BEV (NEEDLE) IMPLANT
NS IRRIG 1000ML POUR BTL (IV SOLUTION) ×2 IMPLANT
PACK ORTHO EXTREMITY (CUSTOM PROCEDURE TRAY) ×2 IMPLANT
PAD ARMBOARD 7.5X6 YLW CONV (MISCELLANEOUS) ×2 IMPLANT
PAD CAST 4YDX4 CTTN HI CHSV (CAST SUPPLIES) ×1 IMPLANT
PADDING CAST COTTON 4X4 STRL (CAST SUPPLIES) ×2
SCRUB BETADINE 4OZ XXX (MISCELLANEOUS) ×2 IMPLANT
SET CYSTO W/LG BORE CLAMP LF (SET/KITS/TRAYS/PACK) ×2 IMPLANT
SOL PREP POV-IOD 4OZ 10% (MISCELLANEOUS) ×2 IMPLANT
SPLINT FIBERGLASS 3X12 (CAST SUPPLIES) ×1 IMPLANT
SPONGE LAP 4X18 RFD (DISPOSABLE) ×1 IMPLANT
SWAB CULTURE ESWAB REG 1ML (MISCELLANEOUS) ×1 IMPLANT
SYR CONTROL 10ML LL (SYRINGE) IMPLANT
TOWEL OR 17X24 6PK STRL BLUE (TOWEL DISPOSABLE) ×2 IMPLANT
TOWEL OR 17X26 10 PK STRL BLUE (TOWEL DISPOSABLE) ×2 IMPLANT
TUBE CONNECTING 12X1/4 (SUCTIONS) ×2 IMPLANT
WATER STERILE IRR 1000ML POUR (IV SOLUTION) ×2 IMPLANT
YANKAUER SUCT BULB TIP NO VENT (SUCTIONS) ×2 IMPLANT

## 2018-10-11 NOTE — ED Notes (Signed)
[facility] at bedside to get pt.

## 2018-10-11 NOTE — Anesthesia Preprocedure Evaluation (Signed)
Anesthesia Evaluation  Patient identified by MRN, date of birth, ID band Patient awake    Reviewed: Allergy & Precautions, NPO status , Patient's Chart, lab work & pertinent test results  Airway Mallampati: II  TM Distance: >3 FB Neck ROM: Full    Dental   Pulmonary sleep apnea , COPD, former smoker,    breath sounds clear to auscultation       Cardiovascular hypertension, + Peripheral Vascular Disease and +CHF   Rhythm:Regular Rate:Normal     Neuro/Psych  Neuromuscular disease CVA    GI/Hepatic negative GI ROS, Neg liver ROS,   Endo/Other  diabetes  Renal/GU Renal InsufficiencyRenal disease     Musculoskeletal   Abdominal   Peds  Hematology negative hematology ROS (+)   Anesthesia Other Findings   Reproductive/Obstetrics                             Lab Results  Component Value Date   WBC 9.7 10/11/2018   HGB 12.0 10/11/2018   HCT 40.0 10/11/2018   MCV 91.3 10/11/2018   PLT 233 10/11/2018   Lab Results  Component Value Date   CREATININE 1.37 (H) 10/11/2018   BUN 26 (H) 10/11/2018   NA 140 10/11/2018   K 4.2 10/11/2018   CL 108 10/11/2018   CO2 23 10/11/2018    Anesthesia Physical Anesthesia Plan  ASA: III and emergent  Anesthesia Plan: General   Post-op Pain Management:    Induction: Intravenous  PONV Risk Score and Plan: 3 and Ondansetron, Dexamethasone and Treatment may vary due to age or medical condition  Airway Management Planned: LMA and Oral ETT  Additional Equipment:   Intra-op Plan:   Post-operative Plan: Extubation in OR  Informed Consent: I have reviewed the patients History and Physical, chart, labs and discussed the procedure including the risks, benefits and alternatives for the proposed anesthesia with the patient or authorized representative who has indicated his/her understanding and acceptance.   Dental advisory given  Plan Discussed with:  CRNA  Anesthesia Plan Comments:         Anesthesia Quick Evaluation

## 2018-10-11 NOTE — H&P (Signed)
[name] is an 52 y.o. female.   Chief Complaint: Infected dog bite right hand including ring finger HPI: Patient presents for evaluation treatment of an infected dog bite to the right hand.  She was bit 3 days ago she has swelling pain inability to use the hand and finger and ascending erythema.  She was transferred from [place].  She notes no other wounds.  She is a diabetic and is currently not taking insulin.  In addition to this she has a history of COPD and is on Eliquis as a blood thinner.  Past Medical History:  Diagnosis Date  . Alcohol use   . Ankle fracture, lateral malleolus, closed 12/30/2011  . Anxiety   . Breast mass, left 12/15/2011  . Chronic anemia   . Chronic combined systolic and diastolic CHF (congestive heart failure) ([place])   . CKD (chronic kidney disease), stage III ([place])    pt denies  . Cocaine abuse ([place])   . COPD (chronic obstructive pulmonary disease) ([place])   . Diabetes mellitus, type 2 ([place])   . Diabetic Charcot foot ([place]) 12/30/2011  . Essential hypertension   . History of GI bleed 12/05/2009   Qualifier: Diagnosis of  By: Zeb Comfort    . Hyperlipidemia   . Hypertensive cardiomyopathy ([place]) 11/08/2012   a. 05/2016: echo showing EF of 20-25% with normal cors by cath in 07/2016.  Marland Kitchen Noncompliance with medication regimen   . Obesity   . Panic attacks   . PAT (paroxysmal atrial tachycardia) ([place])    a. started on amiodarone 07/2017 for this.  . Sleep apnea    does not use cpap  . Stroke ([place])    06/24/17  . Tobacco abuse   . Urinary incontinence     Past Surgical History:  Procedure Laterality Date  . BREAST BIOPSY    . CARDIAC CATHETERIZATION N/A 07/28/2016   Procedure: Left Heart Cath and Coronary Angiography;  Surgeon: Jettie Booze, MD;  Location: [facility];  Service: Cardiovascular;  Laterality: N/A;  . DILATION AND CURETTAGE OF UTERUS    . I&D EXTREMITY Bilateral 09/22/2017   Procedure: BILATERAL  DEBRIDEMENT LEG/FOOT ULCERS, APPLY VERAFLO WOUND VAC;  Surgeon: Newt Minion, MD;  Location: [facility];  Service: Orthopedics;  Laterality: Bilateral;  . IR RADIOLOGIST EVAL & MGMT  07/05/2018  . SKIN SPLIT GRAFT Bilateral 09/28/2017   Procedure: BILATERAL SPLIT THICKNESS SKIN GRAFT LEGS/FEET AND APPLY VAC;  Surgeon: Newt Minion, MD;  Location: [facility];  Service: Orthopedics;  Laterality: Bilateral;    Family History  Problem Relation Age of Onset  . Hypertension Mother   . Heart attack Mother   . Hypertension Father        CABG   . Hypertension Sister   . Hypertension Brother   . Hypertension Sister   . Cancer Sister        breast   . Arthritis Other   . Cancer Other   . Diabetes Other   . Asthma Other   . Hypertension Daughter   . Hypertension Son    Social History:  reports that she quit smoking about 16 months ago. Her smoking use included cigarettes. She has a 20.00 pack-year smoking history. She has never used smokeless tobacco. She reports previous drug use. Frequency: 3.00 times per week. Drugs: Marijuana and Cocaine. She reports that she does not drink alcohol.  Allergies: No Known Allergies  (Not in a hospital admission)  Results for orders placed or performed during the hospital encounter of 10/11/18 (from the past 48 hour(s))  CBC with Differential/Platelet     Status: None   Collection Time: 10/11/18  5:13 PM  Result Value Ref Range   WBC 9.7 4.0 - 10.5 K/uL   RBC 4.38 3.87 - 5.11 MIL/uL   Hemoglobin 12.0 12.0 - 15.0 g/dL   HCT 40.0 36.0 - 46.0 %   MCV 91.3 80.0 - 100.0 fL   MCH 27.4 26.0 - 34.0 pg   MCHC 30.0 30.0 - 36.0 g/dL   RDW 15.0 11.5 - 15.5 %   Platelets 233 150 - 400 K/uL   nRBC 0.0 0.0 - 0.2 %   Neutrophils Relative % 76 %   Neutro Abs 7.4 1.7 - 7.7 K/uL   Lymphocytes Relative 14 %   Lymphs Abs 1.4 0.7 - 4.0 K/uL   Monocytes Relative 6 %   Monocytes Absolute 0.6 0.1 - 1.0 K/uL   Eosinophils Relative 3 %   Eosinophils Absolute 0.3 0.0 - 0.5  K/uL   Basophils Relative 1 %   Basophils Absolute 0.1 0.0 - 0.1 K/uL   Immature Granulocytes 0 %   Abs Immature Granulocytes 0.04 0.00 - 0.07 K/uL    Comment: Performed at [facility], [address] [place], [place] [address]  Comprehensive metabolic panel     Status: Abnormal   Collection Time: 10/11/18  5:13 PM  Result Value Ref Range   Sodium 140 135 - 145 mmol/L   Potassium 4.2 3.5 - 5.1 mmol/L   Chloride 108 98 - 111 mmol/L   CO2 23 22 - 32 mmol/L   Glucose, Bld 97 70 - 99 mg/dL   BUN 26 (H) 6 - 20 mg/dL   Creatinine, Ser 1.37 (H) 0.44 - 1.00 mg/dL   Calcium 9.5 8.9 - 10.3 mg/dL   Total Protein 7.9 6.5 - 8.1 g/dL   Albumin 3.4 (L) 3.5 - 5.0 g/dL   AST 21 15 - 41 U/L   ALT 30 0 - 44 U/L   Alkaline Phosphatase 107 38 - 126 U/L   Total Bilirubin 0.7 0.3 - 1.2 mg/dL   GFR calc non Af Amer 44 (L) >60 mL/min   GFR calc Af Amer 51 (L) >60 mL/min   Anion gap 9 5 - 15    Comment: Performed at [facility], [address] [place], [place] [address]   Dg Hand Complete Right  Result Date: 10/11/2018 CLINICAL DATA:  Dog bite. EXAM: RIGHT HAND - COMPLETE 3+ VIEW COMPARISON:  None. FINDINGS: Generalized osteopenia. No acute fracture or dislocation. Mild osteoarthritis of the first [place] joint. Mild osteoarthritis of the first IP joint. No soft tissue abnormality. IMPRESSION: No acute osseous injury of the right hand. Electronically Signed   By: Kathreen Devoid   On: 10/11/2018 16:05    Review of Systems  Respiratory: Negative.   Genitourinary: Negative.     Blood pressure 124/74, pulse 78, temperature 97.9 F (36.6 C), temperature source Temporal, resp. rate 14, height 5\' 1"  (1.549 m), weight 78 kg, last menstrual period 05/19/2016, SpO2 99 %. Physical Exam  Infected dog bite right ring finger and hand with ascending erythema and an area that appears to be nearly open.  This will require an irrigation and debridement and repair reconstruction.    She has generally poor circulation in  the lower extremities and a history of a skin graft but no open wounds.  There is no signs of  DVT.  Legs are stable to ligamentous and muscular exam.  Chest has equal expansion abdomen is nontender.  She denies shortness of breath or other abnormality tonight.   Assessment/Plan Infected dog bite right hand.  I would recommend irrigation debridement and repair reconstruction as soon as possible.  Although she is on blood thinners the infection is of paramount importance and thus I would recommend moving forward as soon as possible.  She understands risk of bleeding and other issues.  We will plan for admission and hospitalist consult.  We are planning surgery for your upper extremity. The risk and benefits of surgery to include risk of bleeding, infection, anesthesia,  damage to normal structures and failure of the surgery to accomplish its intended goals of relieving symptoms and restoring function have been discussed in detail. With this in mind we plan to proceed. I have specifically discussed with the patient the pre-and postoperative regime and the dos and don'ts and risk and benefits in great detail. Risk and benefits of surgery also include risk of dystrophy(CRPS), chronic nerve pain, failure of the healing process to go onto completion and other inherent risks of surgery The relavent the pathophysiology of the disease/injury process, as well as the alternatives for treatment and postoperative course of action has been discussed in great detail with the patient who desires to proceed.  We will do everything in our power to help you (the patient) restore function to the upper extremity. It is a pleasure to see this patient today.   Willa Frater III, MD 10/11/2018, 8:58 PM

## 2018-10-11 NOTE — Progress Notes (Signed)
Pharmacy Antibiotic Note  [name] is a 52 y.o. female admitted on 10/11/2018 with infected dog bite, now s/p I&D w/ open wound, awaiting repeat washout and possible closure.  Pharmacy has been consulted for Unasyn dosing.  Plan: Unasyn 3g IV Q8H.  Height: 5\' 1"  (154.9 cm) Weight: 172 lb (78 kg) IBW/kg (Calculated) : 47.8  Temp (24hrs), Avg:97.5 F (36.4 C), Min:97.3 F (36.3 C), Max:97.9 F (36.6 C)  Recent Labs  Lab 10/11/18 1713  WBC 9.7  CREATININE 1.37*    Estimated Creatinine Clearance: 44.4 mL/min (A) (by C-G formula based on SCr of 1.37 mg/dL (H)).    No Known Allergies   Thank you for allowing pharmacy to be a part of this patient's care.  Wynona Neat, PharmD, BCPS  10/11/2018 11:56 PM

## 2018-10-11 NOTE — ED Triage Notes (Signed)
Pt state her dog bit her finger on her right hand.  Swelling and redness to her finger. Dog is up to date on all shots

## 2018-10-11 NOTE — Transfer of Care (Signed)
Immediate Anesthesia Transfer of Care Note  Patient: [name]  Procedure(s) Performed: IRRIGATION AND DEBRIDEMENT RIGHT HAND (Right Hand)  Patient Location: PACU  Anesthesia Type:General  Level of Consciousness: awake, alert  and oriented  Airway & Oxygen Therapy: Patient connected to face mask oxygen  Post-op Assessment: Report given to RN and Post -op Vital signs reviewed and stable  Post vital signs: Reviewed and stable  Last Vitals:  Vitals Value Taken Time  BP 178/107 10/11/2018 10:24 PM  Temp    Pulse 93 10/11/2018 10:26 PM  Resp 15 10/11/2018 10:26 PM  SpO2 92 % 10/11/2018 10:26 PM  Vitals shown include unvalidated device data.  Last Pain:  Vitals:   10/11/18 1429  TempSrc: Temporal  PainSc:          Complications: No apparent anesthesia complications
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
## 2018-10-11 NOTE — Op Note (Signed)
See full dictation [id number]   Status post infected dog bite requiring irrigation debridement radical flexor and extensor tenosynovectomy and repair reconstruction.  The wound was left open and we will plan for return visit in 48 hours (Thursday evening) for repeat washout and possible closure.  I would recommend Unasyn initially.  I have contacted the hospitalist who are nice enough to see her.  I would leave her medical management and medicines to their discretion given her multiple medical problems.  Laconda Basich MD

## 2018-10-11 NOTE — Anesthesia Procedure Notes (Signed)
Procedure Name: Intubation Date/Time: 10/11/2018 9:34 PM Performed by: Valetta Fuller, CRNA Pre-anesthesia Checklist: Patient identified, Emergency Drugs available, Suction available and Patient being monitored Patient Re-evaluated:Patient Re-evaluated prior to induction Oxygen Delivery Method: Circle system utilized Preoxygenation: Pre-oxygenation with 100% oxygen Induction Type: IV induction and Rapid sequence Laryngoscope Size: Mac and 3 Grade View: Grade II Tube type: Oral Tube size: 7.5 mm Number of attempts: 2 Airway Equipment and Method: Stylet Placement Confirmation: ETT inserted through vocal cords under direct vision,  positive ETCO2 and breath sounds checked- equal and bilateral Secured at: 21 cm Tube secured with: Tape Dental Injury: Teeth and Oropharynx as per pre-operative assessment  Comments: 1st DL with Miller 2 blade; unable to visualize cords d/t large tongue; 2nd DL with Mac 3, easy intubation without trauma.

## 2018-10-12 ENCOUNTER — Ambulatory Visit: Payer: Self-pay | Admitting: Urology

## 2018-10-12 ENCOUNTER — Encounter (HOSPITAL_COMMUNITY): Payer: Self-pay | Admitting: Internal Medicine

## 2018-10-12 DIAGNOSIS — I10 Essential (primary) hypertension: Secondary | ICD-10-CM

## 2018-10-12 DIAGNOSIS — W540XXA Bitten by dog, initial encounter: Secondary | ICD-10-CM

## 2018-10-12 DIAGNOSIS — I4891 Unspecified atrial fibrillation: Secondary | ICD-10-CM

## 2018-10-12 DIAGNOSIS — E1159 Type 2 diabetes mellitus with other circulatory complications: Secondary | ICD-10-CM

## 2018-10-12 DIAGNOSIS — I5042 Chronic combined systolic (congestive) and diastolic (congestive) heart failure: Secondary | ICD-10-CM

## 2018-10-12 LAB — CBC WITH DIFFERENTIAL/PLATELET
Abs Immature Granulocytes: 0.03 10*3/uL (ref 0.00–0.07)
Basophils Absolute: 0.1 10*3/uL (ref 0.0–0.1)
Basophils Relative: 0 %
Eosinophils Absolute: 0.2 10*3/uL (ref 0.0–0.5)
Eosinophils Relative: 2 %
HCT: 35.8 % — ABNORMAL LOW (ref 36.0–46.0)
Hemoglobin: 10.9 g/dL — ABNORMAL LOW (ref 12.0–15.0)
Immature Granulocytes: 0 %
Lymphocytes Relative: 14 %
Lymphs Abs: 1.6 10*3/uL (ref 0.7–4.0)
MCH: 27.5 pg (ref 26.0–34.0)
MCHC: 30.4 g/dL (ref 30.0–36.0)
MCV: 90.2 fL (ref 80.0–100.0)
Monocytes Absolute: 0.8 10*3/uL (ref 0.1–1.0)
Monocytes Relative: 6 %
Neutro Abs: 9.1 10*3/uL — ABNORMAL HIGH (ref 1.7–7.7)
Neutrophils Relative %: 78 %
Platelets: 203 10*3/uL (ref 150–400)
RBC: 3.97 MIL/uL (ref 3.87–5.11)
RDW: 15 % (ref 11.5–15.5)
WBC: 11.7 10*3/uL — ABNORMAL HIGH (ref 4.0–10.5)
nRBC: 0 % (ref 0.0–0.2)

## 2018-10-12 LAB — BASIC METABOLIC PANEL
Anion gap: 12 (ref 5–15)
BUN: 22 mg/dL — ABNORMAL HIGH (ref 6–20)
CO2: 20 mmol/L — ABNORMAL LOW (ref 22–32)
Calcium: 9.3 mg/dL (ref 8.9–10.3)
Chloride: 107 mmol/L (ref 98–111)
Creatinine, Ser: 1.43 mg/dL — ABNORMAL HIGH (ref 0.44–1.00)
GFR calc Af Amer: 48 mL/min — ABNORMAL LOW (ref >60)
GFR calc non Af Amer: 41 mL/min — ABNORMAL LOW (ref >60)
Glucose, Bld: 145 mg/dL — ABNORMAL HIGH (ref 70–99)
Potassium: 3.9 mmol/L (ref 3.5–5.1)
Sodium: 139 mmol/L (ref 135–145)

## 2018-10-12 LAB — GLUCOSE, CAPILLARY
Glucose-Capillary: 157 mg/dL — ABNORMAL HIGH (ref 70–99)
Glucose-Capillary: 163 mg/dL — ABNORMAL HIGH (ref 70–99)
Glucose-Capillary: 138 mg/dL — ABNORMAL HIGH (ref 70–99)
Glucose-Capillary: 111 mg/dL — ABNORMAL HIGH (ref 70–99)

## 2018-10-12 MED ORDER — METOPROLOL SUCCINATE ER 50 MG PO TB24
50.0000 mg | ORAL_TABLET | Freq: Every day | ORAL | Status: DC
  Administered 2018-10-12 – 2018-10-15 (×4): 50 mg via ORAL
  Filled 2018-10-12 (×4): qty 1

## 2018-10-12 MED ORDER — METHOCARBAMOL 500 MG PO TABS
500.0000 mg | ORAL_TABLET | Freq: Four times a day (QID) | ORAL | Status: DC | PRN
  Administered 2018-10-12 – 2018-10-15 (×6): 500 mg via ORAL
  Filled 2018-10-12 (×6): qty 1

## 2018-10-12 MED ORDER — INSULIN ASPART 100 UNIT/ML ~~LOC~~ SOLN
0.0000 [IU] | Freq: Three times a day (TID) | SUBCUTANEOUS | Status: DC
  Administered 2018-10-12: 2 [IU] via SUBCUTANEOUS
  Administered 2018-10-12 – 2018-10-13 (×3): 1 [IU] via SUBCUTANEOUS
  Administered 2018-10-14: 7 [IU] via SUBCUTANEOUS
  Administered 2018-10-14: 3 [IU] via SUBCUTANEOUS
  Administered 2018-10-14 – 2018-10-15 (×3): 2 [IU] via SUBCUTANEOUS

## 2018-10-12 MED ORDER — HYDRALAZINE HCL 20 MG/ML IJ SOLN: 10.0000 mg | INTRAMUSCULAR | Status: DC | PRN

## 2018-10-12 MED ORDER — AMOXICILLIN-POT CLAVULANATE 875-125 MG PO TABS
1.0000 | ORAL_TABLET | Freq: Two times a day (BID) | ORAL | Status: DC
  Administered 2018-10-12: 1 via ORAL
  Filled 2018-10-12: qty 1

## 2018-10-12 MED ORDER — DULOXETINE HCL 30 MG PO CPEP
30.0000 mg | ORAL_CAPSULE | Freq: Every day | ORAL | Status: DC
  Administered 2018-10-12 – 2018-10-15 (×3): 30 mg via ORAL
  Filled 2018-10-12 (×4): qty 1

## 2018-10-12 MED ORDER — SODIUM CHLORIDE 0.9 % IV SOLN
3.0000 g | Freq: Three times a day (TID) | INTRAVENOUS | Status: DC
  Administered 2018-10-12: 3 g via INTRAVENOUS
  Filled 2018-10-12 (×2): qty 3

## 2018-10-12 MED ORDER — SODIUM CHLORIDE 0.9 % IV SOLN
3.0000 g | Freq: Four times a day (QID) | INTRAVENOUS | Status: DC
  Administered 2018-10-12 – 2018-10-15 (×11): 3 g via INTRAVENOUS
  Filled 2018-10-12 (×15): qty 3

## 2018-10-12 NOTE — Progress Notes (Signed)
[facility] consult follow-up  [name] IRS:[id number] DOB: 1966/10/26 DOA: 10/11/2018 PCP: [name], MD   Narrative: 52 year old female prior nursing home resident CKD 3, DM TY 2, prior CVA, prior polysubstance abuse and cocaine use, bimodal systolic diastolic heart failure in the past, COPD, chronic atrial fibrillation chads score >6, chronic back pain, HTN  Came to & emergency room with the right ring finger bite from a dog-transferred to emergency room at [facility] and seen by hand surgeon Dr. Jones Skene  A & Plan Dog bite status post surgery and wound debridement on Unasyn-follow wound cultures] showing moderate gram-positive cocci] defer to hand surgeon Dr. Amedeo Plenty further management Diabetes mellitus type 2-usually on Tradjenta, currently on sliding scale-sugars were slightly low 85-1 45 still monitoring the same continue sliding scale would not use long-acting at this stage HTN-moderately controlled only, continue amlodipine 10, hydralazine as needed 10 mg-adding back metoprolol XL 50 daily-is not compliant with this in the outpatient Atrial fibrillation chronic [place] score >6-continue apixaban when stabilized and cleared by surgery-rate control as above with metoprolol Prior CVA can resume Plavix 75 as per general surgery Chronic bimodal heart failure in the past BMI 32 Stage II pressure ulcer  We will follow and assist were needed again tomorrow please contact me directly if any questions  DVT prophylaxis: Lovenox code Status: Full family Communication: None disposition Plan: Inpatient for now   Verlon Au, MD  [facility] Direct contact: [phone] --Via amion app OR  --[email or web address]; password [email or web address]  7PM-7AM contact night coverage as above 10/12/2018, 7:56 AM  LOS: 1 day   Consultants:  We are consulting  Procedures:  I&D 12/17  Antimicrobials:  Unasyn 12/17-->Augmentin  Interval history/Subjective: claims pain is 9/10 however  sitting comfortably in bed eating meal and does not appear to have any objective visual signs of distress No other complaints  Objective:  Vitals:  Vitals:   10/12/18 0001 10/12/18 0423  BP: (!) 155/94 127/88  Pulse: 87 96  Resp: 15 18  Temp: 98.7 F (37.1 C) 98.6 F (37 C)  SpO2: 100% 97%    Exam:  EOMI NCAT no pallor no icterus looks older than stated age chest clear no icterus no pallor S1-S2 no murmur Abdomen soft Postop changes to both feet Neurologically intact Edentulous Psych euthymic   I have personally reviewed the following:  DATA   Labs:  BUN/creatinine about the same as prior 22/1.4  WBC 11.7 up from 9    Scheduled Meds: . amLODipine  10 mg Oral Daily  . aspirin EC  81 mg Oral Daily  . darifenacin  7.5 mg Oral Daily  . docusate sodium  100 mg Oral BID  . ezetimibe  10 mg Oral Daily  . insulin aspart  0-9 Units Subcutaneous TID WC  . montelukast  10 mg Oral QHS  . rosuvastatin  10 mg Oral Daily  . senna  1 tablet Oral BID  . traZODone  50 mg Oral QHS  . umeclidinium bromide  1 puff Inhalation Daily  . vitamin C  1,000 mg Oral Daily   Continuous Infusions: . ampicillin-sulbactam (UNASYN) IV 3 g (10/12/18 0602)  . methocarbamol (ROBAXIN) IV      Active Problems:   HTN (hypertension), malignant   CKD (chronic kidney disease) stage 3, GFR 30-59 ml/min (HCC)   Chronic combined systolic and diastolic CHF (congestive heart failure) (HCC)   Type 2 diabetes mellitus with vascular disease (HCC)   COPD (chronic obstructive pulmonary disease) ([place])  Atrial fibrillation, controlled ([place])   Dog bite of hand, right, initial encounter   Dog bite   LOS: 1 day

## 2018-10-12 NOTE — Progress Notes (Signed)
Patient ID: KRYSTYN PICKING, female   DOB: April 10, 1966, 52 y.o.   MRN: [id number] Discussed with patient at length Plan repeat I&D tomorrow NPO after breakfast We have discussed with the patient the issues regarding their infection to the extremity. We will continue antibiotics and await culture results. Often times it will take 3-5 days for cultures to become final. During this time we will typically have the patient on intravenous antibiotics until we can find a parenteral route of antibiotic regime specific for the bacteria or organism isolated. We have discussed with the patient the need for daily irrigation and debridement as well as therapy to the area. We have discussed with the patient the necessity of range of motion to the involved joints as discussed today. We have discussed with the patient the unpredictability of infections at times. We'll continue to work towards good pain control and restoration of function. The patient understands the need for meticulous wound care and the necessity of proper followup.  The possible complications of stiffness (loss of motion), resistant infection, possible deep bone infection, possible chronic pain issues, possible need for multiple surgeries and even amputation.  With this in mind the patient understands our goal is to eradicate the infection to quiesence. We will continue to work towards these goals.  Aisley Whan MD

## 2018-10-12 NOTE — Consult Note (Addendum)
Reason for Consult: Medical management of patient's diabetes and hypertension and CHF. Referring Physician: Dr. Amedeo Plenty.  [name] is an 52 y.o. female.  HPI: 52 year old female with known history of diabetes mellitus, chronic combined systolic and diastolic CHF, atrial fibrillation, hypertension had a dog bite 3 days ago following which patient's hand started having increasing pain and swelling and came to the ER at [facility].  Denies any fever chills chest pain or shortness of breath.  Patient was transferred to [facility] and was taken to the OR by Dr. Amedeo Plenty hand surgeon and had wound debridement done.  I examined the patient following the surgery and at this time patient is having her dinner and denies any chest pain shortness of breath in the right upper extremities under dressing.  Patient states she has been compliant with her medications including apixaban.  Past Medical History:  Diagnosis Date  . Alcohol use   . Ankle fracture, lateral malleolus, closed 12/30/2011  . Anxiety   . Breast mass, left 12/15/2011  . Chronic anemia   . Chronic combined systolic and diastolic CHF (congestive heart failure) ([place])   . CKD (chronic kidney disease), stage III ([place])    pt denies  . Cocaine abuse ([place])   . COPD (chronic obstructive pulmonary disease) ([place])   . Diabetes mellitus, type 2 ([place])   . Diabetic Charcot foot ([place]) 12/30/2011  . Essential hypertension   . History of GI bleed 12/05/2009   Qualifier: Diagnosis of  By: Zeb Comfort    . Hyperlipidemia   . Hypertensive cardiomyopathy ([place]) 11/08/2012   a. 05/2016: echo showing EF of 20-25% with normal cors by cath in 07/2016.  Marland Kitchen Noncompliance with medication regimen   . Obesity   . Panic attacks   . PAT (paroxysmal atrial tachycardia) ([place])    a. started on amiodarone 07/2017 for this.  . Sleep apnea    does not use cpap  . Stroke ([place])    06/24/17  . Tobacco abuse   . Urinary incontinence     Past  Surgical History:  Procedure Laterality Date  . BREAST BIOPSY    . CARDIAC CATHETERIZATION N/A 07/28/2016   Procedure: Left Heart Cath and Coronary Angiography;  Surgeon: Jettie Booze, MD;  Location: [facility];  Service: Cardiovascular;  Laterality: N/A;  . DILATION AND CURETTAGE OF UTERUS    . I&D EXTREMITY Bilateral 09/22/2017   Procedure: BILATERAL DEBRIDEMENT LEG/FOOT ULCERS, APPLY VERAFLO WOUND VAC;  Surgeon: Newt Minion, MD;  Location: [facility];  Service: Orthopedics;  Laterality: Bilateral;  . IR RADIOLOGIST EVAL & MGMT  07/05/2018  . SKIN SPLIT GRAFT Bilateral 09/28/2017   Procedure: BILATERAL SPLIT THICKNESS SKIN GRAFT LEGS/FEET AND APPLY VAC;  Surgeon: Newt Minion, MD;  Location: [facility];  Service: Orthopedics;  Laterality: Bilateral;    Family History  Problem Relation Age of Onset  . Hypertension Mother   . Heart attack Mother   . Hypertension Father        CABG   . Hypertension Sister   . Hypertension Brother   . Hypertension Sister   . Cancer Sister        breast   . Arthritis Other   . Cancer Other   . Diabetes Other   . Asthma Other   . Hypertension Daughter   . Hypertension Son     Social History:  reports that she quit smoking about 16 months ago. Her smoking use included  cigarettes. She has a 20.00 pack-year smoking history. She has never used smokeless tobacco. She reports previous drug use. Frequency: 3.00 times per week. Drugs: Marijuana and Cocaine. She reports that she does not drink alcohol.  Allergies: No Known Allergies  Medications: I have reviewed the patient's current medications.  Results for orders placed or performed during the hospital encounter of 10/11/18 (from the past 48 hour(s))  CBC with Differential/Platelet     Status: None   Collection Time: 10/11/18  5:13 PM  Result Value Ref Range   WBC 9.7 4.0 - 10.5 K/uL   RBC 4.38 3.87 - 5.11 MIL/uL   Hemoglobin 12.0 12.0 - 15.0 g/dL   HCT 40.0 36.0 - 46.0 %   MCV 91.3 80.0 -  100.0 fL   MCH 27.4 26.0 - 34.0 pg   MCHC 30.0 30.0 - 36.0 g/dL   RDW 15.0 11.5 - 15.5 %   Platelets 233 150 - 400 K/uL   nRBC 0.0 0.0 - 0.2 %   Neutrophils Relative % 76 %   Neutro Abs 7.4 1.7 - 7.7 K/uL   Lymphocytes Relative 14 %   Lymphs Abs 1.4 0.7 - 4.0 K/uL   Monocytes Relative 6 %   Monocytes Absolute 0.6 0.1 - 1.0 K/uL   Eosinophils Relative 3 %   Eosinophils Absolute 0.3 0.0 - 0.5 K/uL   Basophils Relative 1 %   Basophils Absolute 0.1 0.0 - 0.1 K/uL   Immature Granulocytes 0 %   Abs Immature Granulocytes 0.04 0.00 - 0.07 K/uL    Comment: Performed at [facility], [address] [place], [place] [address]  Comprehensive metabolic panel     Status: Abnormal   Collection Time: 10/11/18  5:13 PM  Result Value Ref Range   Sodium 140 135 - 145 mmol/L   Potassium 4.2 3.5 - 5.1 mmol/L   Chloride 108 98 - 111 mmol/L   CO2 23 22 - 32 mmol/L   Glucose, Bld 97 70 - 99 mg/dL   BUN 26 (H) 6 - 20 mg/dL   Creatinine, Ser 1.37 (H) 0.44 - 1.00 mg/dL   Calcium 9.5 8.9 - 10.3 mg/dL   Total Protein 7.9 6.5 - 8.1 g/dL   Albumin 3.4 (L) 3.5 - 5.0 g/dL   AST 21 15 - 41 U/L   ALT 30 0 - 44 U/L   Alkaline Phosphatase 107 38 - 126 U/L   Total Bilirubin 0.7 0.3 - 1.2 mg/dL   GFR calc non Af Amer 44 (L) >60 mL/min   GFR calc Af Amer 51 (L) >60 mL/min   Anion gap 9 5 - 15    Comment: Performed at [facility], [address], [place], [place] [address]  Glucose, capillary     Status: None   Collection Time: 10/11/18 10:25 PM  Result Value Ref Range   Glucose-Capillary 85 70 - 99 mg/dL    Dg Hand Complete Right  Result Date: 10/11/2018 CLINICAL DATA:  Dog bite. EXAM: RIGHT HAND - COMPLETE 3+ VIEW COMPARISON:  None. FINDINGS: Generalized osteopenia. No acute fracture or dislocation. Mild osteoarthritis of the first [place] joint. Mild osteoarthritis of the first IP joint. No soft tissue abnormality. IMPRESSION: No acute osseous injury of the right hand. Electronically Signed   By: Kathreen Devoid    On: 10/11/2018 16:05    Review of Systems  Constitutional: Negative.   HENT: Negative.   Eyes: Negative.   Respiratory: Negative.   Cardiovascular: Negative.   Gastrointestinal: Negative.  Genitourinary: Negative.   Musculoskeletal:       Pain in the right hand from dog bite.  Skin:       Dog bite to the right hand.  Neurological: Negative.   Endo/Heme/Allergies: Negative.   Psychiatric/Behavioral: Negative.    Blood pressure (!) 155/94, pulse 87, temperature 98.7 F (37.1 C), temperature source Oral, resp. rate 15, height 5\' 1"  (1.549 m), weight 78 kg, last menstrual period 05/19/2016, SpO2 100 %. Physical Exam  Constitutional: She is oriented to person, place, and time. She appears well-developed and well-nourished. No distress.  HENT:  Head: Normocephalic and atraumatic.  Eyes: Pupils are equal, round, and reactive to light. Right eye exhibits no discharge. Left eye exhibits no discharge.  Neck: Normal range of motion. Neck supple.  Cardiovascular: Normal rate and regular rhythm.  Respiratory: Effort normal and breath sounds normal.  GI: Soft. Bowel sounds are normal.  Musculoskeletal:     Comments: Right upper extremity is in dressing.  Neurological: She is alert and oriented to person, place, and time. No cranial nerve deficit.  Skin: She is not diaphoretic.  Right upper extremity is in dressing.  Psychiatric: She has a normal mood and affect. Her behavior is normal.    Assessment/Plan: #1.  Diabetes mellitus type 2 -patient usually takes Tradjenta but for now we will keep patient on sliding scale coverage.  Closely follow CBGs. #2.  Chronic combined systolic and diastolic heart failure last EF measured in August 2018 was 30 to 35% with grade 1 diastolic dysfunction -diuretics on hold.  ARB is also on hold.  Restart once patient is stable in the morning.  Appears compensated at this time. #3.  Hypertension -continue amlodipine and PRN IV hydralazine.  Restart ARB if  patient continues to be stable. #4.  History of atrial fibrillation presently rate controlled.  Apixaban on hold as discussed with surgeon since patient may need further debridement. #5.  Status post dog bite and wound debridement by Dr. Amedeo Plenty.  On Unasyn. #6.  Chronic kidney disease stage III-IV creatinine appears to be at baseline.  Patient usually takes apixaban which is on hold.  Presently on SCDs for DVT prophylaxis.  Restarting apixaban will be determined by Dr. Amedeo Plenty surgeon as discussed.   Thank you for involving [name] in patient's care we will be following along with you.  Rise Patience 10/12/2018, 12:46 AM

## 2018-10-12 NOTE — Progress Notes (Signed)
Pt arrived to floor A&Ox4; No distress. Able to communicate effectively. Very pleasant.

## 2018-10-12 NOTE — Care Management Important Message (Signed)
Important Message  Patient Details  Name: [name] MRN: [id number] Date of Birth: 20-Apr-1966   Medicare Important Message Given:  Yes    [name] 10/12/2018, 4:15 PM

## 2018-10-12 NOTE — Anesthesia Postprocedure Evaluation (Signed)
Anesthesia Post Note  Patient: [name]  Procedure(s) Performed: IRRIGATION AND DEBRIDEMENT RIGHT HAND (Right Hand)     Anesthesia Post Evaluation  Last Vitals:  Vitals:   10/12/18 0001 10/12/18 0423  BP: (!) 155/94 127/88  Pulse: 87 96  Resp: 15 18  Temp: 37.1 C 37 C  SpO2: 100% 97%    Last Pain:  Vitals:   10/12/18 0423  TempSrc: Oral  PainSc:                  Tiajuana Amass

## 2018-10-12 NOTE — Op Note (Signed)
NAME: [name], IVEY MEDICAL RECORD [id number] ACCOUNT [id number] DATE OF BIRTH:Jan 15, 1966 FACILITY: [facility] LOCATION: [facility] PHYSICIAN:Neomia Herbel M. Lonzell Dorris, MD  OPERATIVE REPORT  DATE OF PROCEDURE:  10/11/2018  PREOPERATIVE DIAGNOSIS:  Right hand and ring finger infection secondary to a dog bite 3 days ago with infectious tenosynovitis and a collar button abscess noted.  POSTOPERATIVE DIAGNOSIS:  Right hand and ring finger infection secondary to a dog bite 3 days ago with infectious tenosynovitis and a collar button abscess noted.  PROCEDURE: 1.  Irrigation and debridement deep abscess ring finger and hand.  This was a deep abscess irrigation and debridement. 2.  Radical extensor tenolysis tenosynovectomy secondary to necrotic infectious tenosynovitis. 3.  Radial digital nerve neurolysis, extensive in nature, right ring finger. 4.  Flexor tenolysis, tenosynovectomy, right ring finger and hand.  SURGEON:  [name], MD  ASSISTANT:  None.  COMPLICATIONS:  None.  ANESTHESIA:  General.  TOURNIQUET TIME:  Less than an hour.  INDICATIONS:  A 52 year old female who was brought down from [name] due to infection in her hand.  She understands risks and benefits of surgery and desires to proceed.  All questions have been encouraged and answered preoperatively.  DESCRIPTION OF PROCEDURE:  The patient was seen by myself and anesthesia.  She was thoroughly counseled and marked.  A tourniquet placed.  General anesthesia induced and arm was scrubbed with Hibiclens scrub followed by Betadine scrub and paint.  Once  this was complete, the patient underwent an incision of foul smelling purulence was removed.  Cultures aerobic and anaerobic were taken and following this, the patient underwent extension of the incision as there was very advanced and a radical  tenosynovitis of an infectious nature.  We performed the initial irrigation and then dissected down to the collar button  area and made an extension.  At this time, I performed an extensive neurolysis of the digital nerve.  This was the radial digital nerve and the radial digital artery was kept intact.  The neurovascular structures were very carefully dealt with and mobilized.  Following this, we then performed very careful and cautious approach to the extremity with radical tenolysis, tenosynovectomy of the extensor apparatus.  Following this, I  dissected down and performed a radical tenolysis, tenosynovectomy of the flexor apparatus.  FDP, FDS tenolysis tenosynovectomy was accomplished.  There was purulence in this area.  The extensor tendon was more involved and underwent a very radical  tenolysis tenosynovectomy as well.  Both the top and bottom tendons underwent aggressive debridement.  Following this, I then irrigated with 3 L of saline.  The patient tolerated this well.  Tissue cultures were also sent.  The patient had the wound covered with Adaptic and tourniquet was deflated with hemostasis being secured.  Following this, we dressed her with a sterile bandage and volar splint.  I will return to the operative theater in 48 hours for repeat irrigation  and debridement and reconstruction efforts.  We will place her on Augmentin due to the nature of the infection being a canine bite.  These notes have been discussed and all questions have been encouraged and answered.  We will ask the hospitalist to see  her given her multiple medical problems and move forward accordingly.  TN/NUANCE  D:10/11/2018 T:10/12/2018 JOB:[id number]

## 2018-10-13 ENCOUNTER — Inpatient Hospital Stay (HOSPITAL_COMMUNITY): Payer: Medicare Other | Admitting: Certified Registered Nurse Anesthetist

## 2018-10-13 ENCOUNTER — Encounter (HOSPITAL_COMMUNITY): Payer: Self-pay | Admitting: Certified Registered Nurse Anesthetist

## 2018-10-13 ENCOUNTER — Encounter (HOSPITAL_COMMUNITY)
Admission: EM | Disposition: A | Discharge status: Home Health | Payer: Self-pay | Source: Home / Self Care | Attending: Orthopedic Surgery

## 2018-10-13 HISTORY — PX: I & D EXTREMITY: SHX5045

## 2018-10-13 LAB — CBC WITH DIFFERENTIAL/PLATELET
Abs Immature Granulocytes: 0.02 10*3/uL (ref 0.00–0.07)
Basophils Absolute: 0.1 10*3/uL (ref 0.0–0.1)
Basophils Relative: 1 %
Eosinophils Absolute: 0.4 10*3/uL (ref 0.0–0.5)
Eosinophils Relative: 4 %
HCT: 35.4 % — ABNORMAL LOW (ref 36.0–46.0)
Hemoglobin: 10.5 g/dL — ABNORMAL LOW (ref 12.0–15.0)
Immature Granulocytes: 0 %
Lymphocytes Relative: 19 %
Lymphs Abs: 1.6 10*3/uL (ref 0.7–4.0)
MCH: 26.9 pg (ref 26.0–34.0)
MCHC: 29.7 g/dL — ABNORMAL LOW (ref 30.0–36.0)
MCV: 90.8 fL (ref 80.0–100.0)
Monocytes Absolute: 0.8 10*3/uL (ref 0.1–1.0)
Monocytes Relative: 9 %
Neutro Abs: 5.7 10*3/uL (ref 1.7–7.7)
Neutrophils Relative %: 67 %
Platelets: 191 10*3/uL (ref 150–400)
RBC: 3.9 MIL/uL (ref 3.87–5.11)
RDW: 14.9 % (ref 11.5–15.5)
WBC: 8.5 10*3/uL (ref 4.0–10.5)
nRBC: 0 % (ref 0.0–0.2)

## 2018-10-13 LAB — BASIC METABOLIC PANEL
Anion gap: 9 (ref 5–15)
BUN: 20 mg/dL (ref 6–20)
CO2: 26 mmol/L (ref 22–32)
Calcium: 8.8 mg/dL — ABNORMAL LOW (ref 8.9–10.3)
Chloride: 107 mmol/L (ref 98–111)
Creatinine, Ser: 1.44 mg/dL — ABNORMAL HIGH (ref 0.44–1.00)
GFR calc Af Amer: 48 mL/min — ABNORMAL LOW (ref >60)
GFR calc non Af Amer: 41 mL/min — ABNORMAL LOW (ref >60)
Glucose, Bld: 140 mg/dL — ABNORMAL HIGH (ref 70–99)
Potassium: 4 mmol/L (ref 3.5–5.1)
Sodium: 142 mmol/L (ref 135–145)

## 2018-10-13 LAB — GLUCOSE, CAPILLARY
Glucose-Capillary: 111 mg/dL — ABNORMAL HIGH (ref 70–99)
Glucose-Capillary: 149 mg/dL — ABNORMAL HIGH (ref 70–99)
Glucose-Capillary: 90 mg/dL (ref 70–99)
Glucose-Capillary: 82 mg/dL (ref 70–99)

## 2018-10-13 SURGERY — IRRIGATION AND DEBRIDEMENT EXTREMITY
Anesthesia: General | Site: Hand | Laterality: Right

## 2018-10-13 MED ORDER — LACTATED RINGERS IV SOLN
INTRAVENOUS | Status: DC
  Administered 2018-10-13 (×2): via INTRAVENOUS

## 2018-10-13 MED ORDER — FENTANYL CITRATE (PF) 250 MCG/5ML IJ SOLN
INTRAMUSCULAR | Status: AC
  Filled 2018-10-13: qty 5

## 2018-10-13 MED ORDER — PROPOFOL 10 MG/ML IV BOLUS
INTRAVENOUS | Status: DC | PRN
  Administered 2018-10-13: 100 mg via INTRAVENOUS

## 2018-10-13 MED ORDER — MIDAZOLAM HCL 2 MG/2ML IJ SOLN
INTRAMUSCULAR | Status: AC
  Filled 2018-10-13: qty 2

## 2018-10-13 MED ORDER — LACTATED RINGERS IV SOLN
INTRAVENOUS | Status: DC
  Administered 2018-10-14: 23:00:00 via INTRAVENOUS

## 2018-10-13 MED ORDER — DEXAMETHASONE SODIUM PHOSPHATE 10 MG/ML IJ SOLN
INTRAMUSCULAR | Status: AC
  Filled 2018-10-13: qty 1

## 2018-10-13 MED ORDER — DEXAMETHASONE SODIUM PHOSPHATE 4 MG/ML IJ SOLN
INTRAMUSCULAR | Status: DC | PRN
  Administered 2018-10-13: 5 mg via INTRAVENOUS

## 2018-10-13 MED ORDER — PROMETHAZINE HCL 25 MG/ML IJ SOLN: 6.2500 mg | INTRAMUSCULAR | Status: DC | PRN

## 2018-10-13 MED ORDER — PROPOFOL 10 MG/ML IV BOLUS
INTRAVENOUS | Status: AC
  Filled 2018-10-13: qty 20

## 2018-10-13 MED ORDER — MIDAZOLAM HCL 5 MG/5ML IJ SOLN
INTRAMUSCULAR | Status: DC | PRN
  Administered 2018-10-13: 2 mg via INTRAVENOUS

## 2018-10-13 MED ORDER — 0.9 % SODIUM CHLORIDE (POUR BTL) OPTIME
TOPICAL | Status: DC | PRN
  Administered 2018-10-13: 1000 mL

## 2018-10-13 MED ORDER — SUCCINYLCHOLINE CHLORIDE 20 MG/ML IJ SOLN
INTRAMUSCULAR | Status: DC | PRN
  Administered 2018-10-13: 100 mg via INTRAVENOUS

## 2018-10-13 MED ORDER — FENTANYL CITRATE (PF) 100 MCG/2ML IJ SOLN: 25.0000 ug | INTRAMUSCULAR | Status: DC | PRN

## 2018-10-13 MED ORDER — ONDANSETRON HCL 4 MG/2ML IJ SOLN
INTRAMUSCULAR | Status: AC
  Filled 2018-10-13: qty 2

## 2018-10-13 MED ORDER — LIDOCAINE HCL (CARDIAC) PF 100 MG/5ML IV SOSY
PREFILLED_SYRINGE | INTRAVENOUS | Status: DC | PRN
  Administered 2018-10-13: 100 mg via INTRAVENOUS

## 2018-10-13 MED ORDER — PHENYLEPHRINE HCL 10 MG/ML IJ SOLN
INTRAMUSCULAR | Status: DC | PRN
  Administered 2018-10-13: 80 ug via INTRAVENOUS

## 2018-10-13 MED ORDER — MEPERIDINE HCL 50 MG/ML IJ SOLN: 6.2500 mg | INTRAMUSCULAR | Status: DC | PRN

## 2018-10-13 MED ORDER — SODIUM CHLORIDE 0.9 % IR SOLN
Status: DC | PRN
  Administered 2018-10-13: 6000 mL

## 2018-10-13 SURGICAL SUPPLY — 44 items
BANDAGE ACE 4X5 VEL STRL LF (GAUZE/BANDAGES/DRESSINGS) ×2 IMPLANT
BNDG CONFORM 2 STRL LF (GAUZE/BANDAGES/DRESSINGS) IMPLANT
BNDG GAUZE ELAST 4 BULKY (GAUZE/BANDAGES/DRESSINGS) ×4 IMPLANT
CORDS BIPOLAR (ELECTRODE) ×2 IMPLANT
COVER SURGICAL LIGHT HANDLE (MISCELLANEOUS) ×2 IMPLANT
COVER WAND RF STERILE (DRAPES) ×1 IMPLANT
CUFF TOURNIQUET SINGLE 18IN (TOURNIQUET CUFF) ×2 IMPLANT
CUFF TOURNIQUET SINGLE 24IN (TOURNIQUET CUFF) IMPLANT
DRSG ADAPTIC 3X8 NADH LF (GAUZE/BANDAGES/DRESSINGS) ×2 IMPLANT
GAUZE SPONGE 4X4 12PLY STRL (GAUZE/BANDAGES/DRESSINGS) ×1 IMPLANT
GAUZE SPONGE 4X4 12PLY STRL LF (GAUZE/BANDAGES/DRESSINGS) ×1 IMPLANT
GAUZE XEROFORM 1X8 LF (GAUZE/BANDAGES/DRESSINGS) ×1 IMPLANT
GAUZE XEROFORM 5X9 LF (GAUZE/BANDAGES/DRESSINGS) ×1 IMPLANT
GLOVE BIOGEL M 8.0 STRL (GLOVE) ×1 IMPLANT
GLOVE SS BIOGEL STRL SZ 8 (GLOVE) ×1 IMPLANT
GLOVE SUPERSENSE BIOGEL SZ 8 (GLOVE) ×1
GOWN STRL REUS W/ TWL LRG LVL3 (GOWN DISPOSABLE) ×1 IMPLANT
GOWN STRL REUS W/ TWL XL LVL3 (GOWN DISPOSABLE) ×2 IMPLANT
GOWN STRL REUS W/TWL LRG LVL3 (GOWN DISPOSABLE) ×2
GOWN STRL REUS W/TWL XL LVL3 (GOWN DISPOSABLE) ×2
KIT BASIN OR (CUSTOM PROCEDURE TRAY) ×2 IMPLANT
KIT TURNOVER KIT B (KITS) ×2 IMPLANT
MANIFOLD NEPTUNE II (INSTRUMENTS) ×2 IMPLANT
NDL HYPO 25GX1X1/2 BEV (NEEDLE) IMPLANT
NEEDLE HYPO 25GX1X1/2 BEV (NEEDLE) IMPLANT
NS IRRIG 1000ML POUR BTL (IV SOLUTION) ×2 IMPLANT
PACK ORTHO EXTREMITY (CUSTOM PROCEDURE TRAY) ×2 IMPLANT
PAD ARMBOARD 7.5X6 YLW CONV (MISCELLANEOUS) ×3 IMPLANT
PAD CAST 4YDX4 CTTN HI CHSV (CAST SUPPLIES) ×1 IMPLANT
PADDING CAST COTTON 4X4 STRL (CAST SUPPLIES) ×2
SCRUB BETADINE 4OZ XXX (MISCELLANEOUS) ×2 IMPLANT
SET CYSTO W/LG BORE CLAMP LF (SET/KITS/TRAYS/PACK) ×1 IMPLANT
SET IRRIG Y TYPE TUR BLADDER L (SET/KITS/TRAYS/PACK) ×1 IMPLANT
SOL PREP POV-IOD 4OZ 10% (MISCELLANEOUS) ×2 IMPLANT
SPONGE LAP 4X18 RFD (DISPOSABLE) ×1 IMPLANT
SUT CHROMIC 4 0 PS 2 18 (SUTURE) ×1 IMPLANT
SUT PROLENE 4 0 PS 2 18 (SUTURE) ×1 IMPLANT
SWAB CULTURE ESWAB REG 1ML (MISCELLANEOUS) IMPLANT
SYR CONTROL 10ML LL (SYRINGE) IMPLANT
TOWEL OR 17X24 6PK STRL BLUE (TOWEL DISPOSABLE) ×1 IMPLANT
TOWEL OR 17X26 10 PK STRL BLUE (TOWEL DISPOSABLE) ×2 IMPLANT
TUBE CONNECTING 12X1/4 (SUCTIONS) ×2 IMPLANT
WATER STERILE IRR 1000ML POUR (IV SOLUTION) ×1 IMPLANT
YANKAUER SUCT BULB TIP NO VENT (SUCTIONS) ×1 IMPLANT

## 2018-10-13 NOTE — Anesthesia Preprocedure Evaluation (Addendum)
Anesthesia Evaluation  Patient identified by MRN, date of birth, ID band Patient awake    Reviewed: Allergy & Precautions, NPO status , Patient's Chart, lab work & pertinent test results  Airway Mallampati: II  TM Distance: >3 FB Neck ROM: Full    Dental   Pulmonary sleep apnea , COPD, former smoker,    breath sounds clear to auscultation       Cardiovascular hypertension, Pt. on medications + Peripheral Vascular Disease and +CHF   Rhythm:Regular Rate:Normal  Echo 05/2017 - Left ventricle: The cavity size was normal. Wall thickness was increased in a pattern of moderate LVH. Systolic function was moderately to severely reduced. The estimated ejection fraction was in the range of 30% to 35%. Diffuse hypokinesis. Features are consistent with a pseudonormal left ventricular filling pattern, with concomitant abnormal relaxation and increased filling pressure (grade 2 diastolic dysfunction). - Mitral valve: There was moderate regurgitation directed posteriorly. - Left atrium: The atrium was mildly dilated.   Neuro/Psych PSYCHIATRIC DISORDERS Anxiety Depression  Neuromuscular disease CVA    GI/Hepatic negative GI ROS, Neg liver ROS,   Endo/Other  diabetes, Type 2  Renal/GU Renal InsufficiencyRenal disease     Musculoskeletal  (+) Arthritis ,   Abdominal   Peds  Hematology  (+) anemia ,   Anesthesia Other Findings   Reproductive/Obstetrics                            Lab Results  Component Value Date   WBC 8.5 10/13/2018   HGB 10.5 (L) 10/13/2018   HCT 35.4 (L) 10/13/2018   MCV 90.8 10/13/2018   PLT 191 10/13/2018   Lab Results  Component Value Date   CREATININE 1.44 (H) 10/13/2018   BUN 20 10/13/2018   NA 142 10/13/2018   K 4.0 10/13/2018   CL 107 10/13/2018   CO2 26 10/13/2018    Anesthesia Physical  Anesthesia Plan  ASA: III and emergent  Anesthesia Plan: General   Post-op Pain  Management:    Induction: Intravenous  PONV Risk Score and Plan: 3 and Ondansetron, Dexamethasone and Treatment may vary due to age or medical condition  Airway Management Planned: Oral ETT  Additional Equipment: None  Intra-op Plan:   Post-operative Plan: Extubation in OR  Informed Consent: I have reviewed the patients History and Physical, chart, labs and discussed the procedure including the risks, benefits and alternatives for the proposed anesthesia with the patient or authorized representative who has indicated his/her understanding and acceptance.   Dental advisory given  Plan Discussed with: CRNA  Anesthesia Plan Comments:         Anesthesia Quick Evaluation

## 2018-10-13 NOTE — Op Note (Signed)
See dictation#[id number]  Status post irrigation debridement.  Wound conditions were improved.  The patient tolerated this well.  We will continue elevation range of motion and begin PT hydrotherapy Saturday to teach wet-to-dry dressing changes in hopes for this charge from the hospital over the weekend.  Syerra Abdelrahman MD

## 2018-10-13 NOTE — Transfer of Care (Signed)
Immediate Anesthesia Transfer of Care Note  Patient: [name]  Procedure(s) Performed: REPEAT IRRIGATION AND DEBRIDEMENT RIGHT HAND (Right Hand)  Patient Location: PACU  Anesthesia Type:General  Level of Consciousness: awake, alert , oriented and patient cooperative  Airway & Oxygen Therapy: Patient Spontanous Breathing  Post-op Assessment: Report given to RN and Post -op Vital signs reviewed and stable  Post vital signs: Reviewed and stable  Last Vitals:  Vitals Value Taken Time  BP 146/99 10/13/2018  7:44 PM  Temp    Pulse 47 10/13/2018  7:44 PM  Resp 14 10/13/2018  7:45 PM  SpO2 95 % 10/13/2018  7:44 PM  Vitals shown include unvalidated device data.  Last Pain:  Vitals:   10/13/18 1538  TempSrc:   PainSc: 0-No pain      Patients Stated Pain Goal: 2 (34/03/52 4818)  Complications: No apparent anesthesia complications

## 2018-10-13 NOTE — Progress Notes (Signed)
[facility] consult follow-up  [name] POE:[id number] DOB: 18-Jun-1966 DOA: 10/11/2018 PCP: [name], MD   Narrative: 52 year old female prior nursing home resident CKD 3, DM TY 2, prior CVA, prior polysubstance abuse and cocaine use, bimodal systolic diastolic heart failure in the past, COPD, chronic atrial fibrillation chads score >6, chronic back pain, HTN  Came to & emergency room with the right ring finger bite from a dog-transferred to emergency room at [facility] and seen by hand surgeon Dr. Jones Skene  A & Plan Dog bite status post surgery and wound debridement on Unasyn-per hand surgeon Dr. 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-usually on Tradjenta, currently on sliding scale-sugars controlled as below continue sliding scale would not use long-acting at this stage--resume Tradjenta and gabapentin on d/c HTN-moderately controlled only, continue amlodipine 10, hydralazine as needed 10 mg-adding back metoprolol XL 50 daily--have d/c ARB-reasonably controlled Atrial fibrillation chronic [place] score >6-continue apixaban when stabilized and cleared by surgery-rate control as above with metoprolol Prior CVA can resume Plavix 75 as per  surgery Chronic bimodal heart failure in the past-euvolemic in no overload-lasix from admission has been held BMI 32 Bipolar-continue  Stage II pressure ulcer  We will follow and assist were needed again tomorrow please contact me directly if any questions  DVT prophylaxis: Lovenox code Status: Full family Communication: None disposition Plan: Inpatient for now   Verlon Au, MD  [facility] Direct contact: [phone] --Via amion app OR  --[email or web address]; password [email or web address]  7PM-7AM contact night coverage as above 10/13/2018, 12:29 PM  LOS: 2 days   Consultants:  We are consulting  Procedures:  I&D 12/17  Antimicrobials:  Unasyn 12/17-->Augmentin  Interval  history/Subjective:  Pain controlled no new issues Eating and drinking In nad No fever  Objective:  Vitals:  Vitals:   10/13/18 0835 10/13/18 1034  BP:  118/89  Pulse:  75  Resp:    Temp:    SpO2: 98%     Exam:  EOMI NCAT no pallor no icterus looks older than stated age chest clear no icterus no pallor edentulous S1-S2 no murmur Abdomen soft Postop changes to both feet Neurologically intact Psych euthymic   I have personally reviewed the following:  DATA   Labs:  BUN/creatinine about the same as prior 22/1.4  WBC 11.7 up from 9-->8.5    Scheduled Meds: . amLODipine  10 mg Oral Daily  . aspirin EC  81 mg Oral Daily  . darifenacin  7.5 mg Oral Daily  . docusate sodium  100 mg Oral BID  . DULoxetine  30 mg Oral Daily  . ezetimibe  10 mg Oral Daily  . insulin aspart  0-9 Units Subcutaneous TID WC  . metoprolol succinate  50 mg Oral Daily  . montelukast  10 mg Oral QHS  . rosuvastatin  10 mg Oral Daily  . senna  1 tablet Oral BID  . traZODone  50 mg Oral QHS  . umeclidinium bromide  1 puff Inhalation Daily  . vitamin C  1,000 mg Oral Daily   Continuous Infusions: . ampicillin-sulbactam (UNASYN) IV 3 g (10/13/18 0640)  . methocarbamol (ROBAXIN) IV      Active Problems:   HTN (hypertension), malignant   CKD (chronic kidney disease) stage 3, GFR 30-59 ml/min (HCC)   Chronic combined systolic and diastolic CHF (congestive heart failure) (HCC)   Type 2 diabetes mellitus with vascular disease (HCC)   COPD (chronic obstructive pulmonary disease) (HCC)   Atrial  fibrillation, controlled ([place])   Dog bite of hand, right, initial encounter   Dog bite   LOS: 2 days

## 2018-10-13 NOTE — Anesthesia Procedure Notes (Signed)
Procedure Name: Intubation Date/Time: 10/13/2018 7:03 PM Performed by: Oletta Lamas, CRNA Pre-anesthesia Checklist: Patient identified, Emergency Drugs available, Suction available and Patient being monitored Patient Re-evaluated:Patient Re-evaluated prior to induction Oxygen Delivery Method: Circle System Utilized Preoxygenation: Pre-oxygenation with 100% oxygen Induction Type: IV induction Ventilation: Mask ventilation without difficulty Laryngoscope Size: Miller and 2 Grade View: Grade I Tube type: Oral Number of attempts: 1 Airway Equipment and Method: Stylet Placement Confirmation: ETT inserted through vocal cords under direct vision,  positive ETCO2 and breath sounds checked- equal and bilateral Secured at: 21 cm Tube secured with: Tape Dental Injury: Teeth and Oropharynx as per pre-operative assessment

## 2018-10-14 ENCOUNTER — Encounter (HOSPITAL_COMMUNITY): Payer: Self-pay | Admitting: Orthopedic Surgery

## 2018-10-14 LAB — GLUCOSE, CAPILLARY
Glucose-Capillary: 197 mg/dL — ABNORMAL HIGH (ref 70–99)
Glucose-Capillary: 311 mg/dL — ABNORMAL HIGH (ref 70–99)
Glucose-Capillary: 211 mg/dL — ABNORMAL HIGH (ref 70–99)
Glucose-Capillary: 210 mg/dL — ABNORMAL HIGH (ref 70–99)

## 2018-10-14 NOTE — Progress Notes (Signed)
Patient has been consistently stable during this hospital stay  Would resume all home meds including plavix asa eliquis except Olmesartan - change lasix to every other day on eventual discharge with follow up as per Dr. Amedeo Plenty for hand review  I am signing off-thanks for consult  Verneita Griffes, MD [facility] 7:47 AM

## 2018-10-14 NOTE — Op Note (Signed)
NAME: FLORINA, GLAS MEDICAL RECORD DE:[id number] ACCOUNT [id number] DATE OF BIRTH:Jan 09, 1966 FACILITY: [facility] LOCATION: [facility] PHYSICIAN:Severina Sykora M. 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  PROCEDURE: 1.  Irrigation and debridement of skin, subcutaneous tissue, tendon and muscle. 2.  Radial digital nerve neurolysis, right ring finger. 3.  Radical extensor tenolysis tenosynovectomy finger and hand, right ring finger. 4.  Flexor tenolysis, tenosynovectomy, right ring finger.  SURGEON:  [name], MD  ASSISTANT:  None.  COMPLICATIONS:  None.  ANESTHESIA:  General.  TOURNIQUET TIME:  Less than an hour.  INDICATIONS:  The patient is a 52 year old female who looks older than her stated age and has significant infectious process about the hand.  She notes no locking, popping, catching, numbness or tingling.  She and I have discussed all issues at length.   She has swelling and a deep abscess with purulent extensor and flexor tenosynovitis noted.  She is here today for repeat washout.  DESCRIPTION OF PROCEDURE:  The patient was seen by myself and anesthesia given a general anesthetic, prepped and draped in usual sterile fashion with Hibiclens scrub followed by a 10-minute surgical Betadine scrub and paint.  The patient had improved  wound conditions.  At this time, I performed irrigation and debridement of skin and subcutaneous tissue, muscle tissue and tendinous tissue.  This was an excisional debridement with curette, scalpel, and knife blade, 3 x 2 cm in nature.  Following this,  I performed a radical extensor tenolysis  tenosynovectomy and removed nonviable tissue and synovitis/tenosynovitic reaccumulation.  Following this, I exposed the radial digital nerve and performed a neurolysis of the radial digital nerve as it was encased in inflammatory tissue.  The radial digital artery was also preserved.  The patient then had attention drawn towards the flexor  apparatus, which was opened and irrigation and debridement ensued.  Following this, we performed a copious irrigation process with 6 liters of fluid through the area.  Wound conditions were stable and thus I performed a far-near-near-far and horizontal mattress closure, leaving a portion of the wound opened where she had necrosis and was previously debrided.  She tolerated this well.  Good refill.  No tourniquet time was used.  All questions were addressed preoperatively and will monitor postoperatively.  I would recommend Unasyn to be continued and plan for transition to Augmentin as an outpatient.  I would  recommend whirlpool/hydrotherapy beginning Saturday and teach her wet-to-dry dressing changes in hopes that she can perform this at home once she is able to demonstrate competency in the hospital setting.  All questions have been encouraged and answered.  TN/NUANCE  D:10/13/2018 T:10/14/2018 JOB:[id number]

## 2018-10-14 NOTE — Anesthesia Postprocedure Evaluation (Signed)
Anesthesia Post Note  Patient: [name]  Procedure(s) Performed: REPEAT IRRIGATION AND DEBRIDEMENT RIGHT HAND (Right Hand)     Patient location during evaluation: PACU Anesthesia Type: General Level of consciousness: sedated and patient cooperative Pain management: pain level controlled Vital Signs Assessment: post-procedure vital signs reviewed and stable Respiratory status: spontaneous breathing Cardiovascular status: stable Anesthetic complications: no    Last Vitals:  Vitals:   10/13/18 2050 10/14/18 0611  BP: (!) 149/92 (!) 157/99  Pulse: 61 66  Resp: 18 20  Temp: 36.6 C 36.8 C  SpO2: 94% 93%    Last Pain:  Vitals:   10/14/18 0637  TempSrc:   PainSc: [place]

## 2018-10-14 NOTE — Progress Notes (Signed)
Hydrotherapy Note  Patient Details Name: [name] MRN: [id number] DOB: 13-Sep-1966   Cancelled Treatment:    Reason Eval/Treat Not Completed: Other (comment). Hydrotherapy to start Saturday.   Shary Decamp [facility] 10/14/2018, 10:50 AM [place] Pager [phone] Office [phone]

## 2018-10-14 NOTE — Progress Notes (Signed)
Patient ID: [name], female   DOB: 1966-03-13, 52 y.o.   MRN: [id number] Stable Plan dressing change Saturday Rashaan Wyles md

## 2018-10-14 NOTE — Consult Note (Signed)
   [facility] Inpatient Consult   10/14/2018  CARYN GIENGER 06-23-1966 [id number]    Patient screened for potential [facility] Care Management services due to unplanned readmission risk score of 43% (extreme).  Went to bedside to speak with Ms. Medders about [facility] Management program services. She is agreeable and [facility] written consent obtained.   Ms. Rayson endorses she lives alone. States she uses Medicaid transportation for local appointments. States she has partial Medicaid.Therefore, she reports, she is unable to use Medicaid transportation to MD appointments in [place]. She lives in [place]. Denies having any concerns with medications.  Confirmed Primary Care MD is Dr. Moshe Cipro [facility] is listed as doing toc).   Confirmed best contact number for Ms.Tipler as [phone].  Ms. Frink is agreeable to [facility] Management follow up for complex case management. She has a history of DM, COPD, AFIB, HTN.  Will make inpatient RNCM aware [facility] to follow post hospital discharge.    Marthenia Rolling, MSN-Ed, RN,BSN [facility] Liaison [phone]

## 2018-10-15 LAB — GLUCOSE, CAPILLARY
Glucose-Capillary: 198 mg/dL — ABNORMAL HIGH (ref 70–99)
Glucose-Capillary: 168 mg/dL — ABNORMAL HIGH (ref 70–99)

## 2018-10-15 MED ORDER — OXYCODONE HCL 5 MG PO TABS: 5.0000 mg | ORAL_TABLET | Freq: Four times a day (QID) | ORAL | 0 refills | Status: DC | PRN

## 2018-10-15 MED ORDER — AMOXICILLIN-POT CLAVULANATE 875-125 MG PO TABS: 1.0000 | ORAL_TABLET | Freq: Two times a day (BID) | ORAL | 0 refills | Status: AC

## 2018-10-15 NOTE — Discharge Instructions (Signed)
Please make sure to change her bandage daily.  You will have dressing supplies to change her bandage daily as instructed by your physician.  Please perform a wet-to-dry bandage change to the affected area and please be sure to see your doctor in 14 days.  We have phoned into your pharmacy a prescription for antibiotics to take for 14 days and a pain medicine to take is necessary.  Please call for any problems.  Please resume all of your regular medicines  We recommend that you to take vitamin C 1000 mg a day to promote healing. We also recommend that if you require  pain medicine that you take a stool softener to prevent constipation as most pain medicines will have constipation side effects. We recommend either Peri-Colace or Senokot and recommend that you also consider adding MiraLAX as well to prevent the constipation affects from pain medicine if you are required to use them. These medicines are over the counter and may be purchased at a local pharmacy. A cup of yogurt and a probiotic can also be helpful during the recovery process as the medicines can disrupt your intestinal environment. Keep bandage clean and dry.  Call for any problems.  No smoking.  Criteria for driving a car: you should be off your pain medicine for 7-8 hours, able to drive one handed(confident), thinking clearly and feeling able in your judgement to drive. Continue elevation as it will decrease swelling.  If instructed by MD move your fingers within the confines of the bandage/splint.  Use ice if instructed by your MD. Call immediately for any sudden loss of feeling in your hand/arm or change in functional abilities of the extremity.

## 2018-10-15 NOTE — Discharge Summary (Signed)
Physician Discharge Summary  Patient ID: [name] MRN: [id number] DOB/AGE: 1965-12-28 52 y.o.  Admit date: 10/11/2018 Discharge date:   Admission Diagnoses: INFECTED DOG BITE RIGHT HAND Past Medical History:  Diagnosis Date  . Alcohol use   . Ankle fracture, lateral malleolus, closed 12/30/2011  . Anxiety   . Breast mass, left 12/15/2011  . Chronic anemia   . Chronic combined systolic and diastolic CHF (congestive heart failure) ([place])   . CKD (chronic kidney disease), stage III ([place])    pt denies  . Cocaine abuse ([place])   . COPD (chronic obstructive pulmonary disease) ([place])   . Diabetes mellitus, type 2 ([place])   . Diabetic Charcot foot ([place]) 12/30/2011  . Essential hypertension   . History of GI bleed 12/05/2009   Qualifier: Diagnosis of  By: Zeb Comfort    . Hyperlipidemia   . Hypertensive cardiomyopathy ([place]) 11/08/2012   a. 05/2016: echo showing EF of 20-25% with normal cors by cath in 07/2016.  Marland Kitchen Noncompliance with medication regimen   . Obesity   . Panic attacks   . PAT (paroxysmal atrial tachycardia) ([place])    a. started on amiodarone 07/2017 for this.  . Sleep apnea    does not use cpap  . Stroke ([place])    06/24/17  . Tobacco abuse   . Urinary incontinence     Discharge Diagnoses:  Active Problems:   HTN (hypertension), malignant   CKD (chronic kidney disease) stage 3, GFR 30-59 ml/min (HCC)   Chronic combined systolic and diastolic CHF (congestive heart failure) (HCC)   Type 2 diabetes mellitus with vascular disease (HCC)   COPD (chronic obstructive pulmonary disease) (HCC)   Atrial fibrillation, controlled ([place])   Dog bite of hand, right, initial encounter   Dog bite   Surgeries: Procedure(s): REPEAT IRRIGATION AND DEBRIDEMENT RIGHT HAND on 10/13/2018    Consultants:   Discharged Condition: Improved  Hospital Course: [name] is an 52 y.o. female who was admitted 10/11/2018 with a chief complaint of  Chief Complaint  Patient presents  with  . Animal Bite  , and found to have a diagnosis of INFECTED DOG BITE RIGHT HAND.  They were brought to the operating room on 10/13/2018 and underwent Procedure(s): REPEAT IRRIGATION AND DEBRIDEMENT RIGHT HAND.    They were given perioperative antibiotics:  Anti-infectives (From admission, onward)   Start     Dose/Rate Route Frequency Ordered Stop   10/15/18 0000  amoxicillin-clavulanate (AUGMENTIN) 875-125 MG tablet     1 tablet Oral 2 times daily 10/15/18 0916 10/29/18 2359   10/12/18 1930  Ampicillin-Sulbactam (UNASYN) 3 g in sodium chloride 0.9 % 100 mL IVPB     3 g 200 mL/hr over 30 Minutes Intravenous Every 6 hours 10/12/18 1930     10/12/18 1000  amoxicillin-clavulanate (AUGMENTIN) 875-125 MG per tablet 1 tablet  Status:  Discontinued     1 tablet Oral Every 12 hours 10/12/18 0905 10/12/18 1930   10/12/18 0600  Ampicillin-Sulbactam (UNASYN) 3 g in sodium chloride 0.9 % 100 mL IVPB  Status:  Discontinued     3 g 200 mL/hr over 30 Minutes Intravenous Every 8 hours 10/12/18 0008 10/12/18 0905    .  They were given sequential compression devices, early ambulation, and Other (comment) for DVT prophylaxis.  Recent vital signs:  Patient Vitals for the past 24 hrs:  BP Temp Temp src Pulse Resp SpO2  10/15/18 0605 (!) 138/92 98.1 F (36.7 C) Oral 69 16 99 %  10/14/18 2131 (!) 141/87 98.6 F (37 C) Oral 76 16 97 %  10/14/18 1411 (!) 137/97 98 F (36.7 C) Oral 78 18 100 %  .  Recent laboratory studies: No results found.  Discharge Medications:   Allergies as of 10/15/2018   No Known Allergies     Medication List    TAKE these medications   albuterol 108 (90 Base) MCG/ACT inhaler Commonly known as:  PROAIR HFA INHALE 2 PUFFS EVERY 6 HOURS AS NEEDED FOR SHORTNESS OF BREATH/WHEEZING. What changed:    how much to take  how to take this  when to take this  reasons to take this  additional instructions   amiodarone 200 MG tablet Commonly known as:  PACERONE Take  1 tablet (200 mg total) by mouth 2 (two) times daily.   amLODipine 10 MG tablet Commonly known as:  NORVASC Take 1 tablet (10 mg total) by mouth daily.   amoxicillin-clavulanate 875-125 MG tablet Commonly known as:  AUGMENTIN Take 1 tablet by mouth 2 (two) times daily for 14 days.   apixaban 5 MG Tabs tablet Commonly known as:  ELIQUIS Take 1 tablet (5 mg total) by mouth 2 (two) times daily.   aspirin EC 81 MG tablet Take 1 tablet (81 mg total) by mouth daily.   clopidogrel 75 MG tablet Commonly known as:  PLAVIX   ergocalciferol 1.25 MG (50000 UT) capsule Commonly known as:  VITAMIN D2 Take 1 capsule (50,000 Units total) by mouth once a week. One capsule once weekly What changed:  additional instructions   ezetimibe 10 MG tablet Commonly known as:  ZETIA TAKE 1 TABLET BY MOUTH ONCE A DAY.   FLUoxetine 40 MG capsule Commonly known as:  PROZAC Take 1 capsule (40 mg total) by mouth daily.   furosemide 20 MG tablet Commonly known as:  LASIX TAKE 1 TABLET BY MOUTH ONCE A DAY.   gabapentin 300 MG capsule Commonly known as:  NEURONTIN Take 1 capsule (300 mg total) by mouth 3 (three) times daily. 3 times a day when necessary neuropathy pain What changed:  additional instructions   ipratropium-albuterol 0.5-2.5 (3) MG/3ML Soln Commonly known as:  DUONEB Take 3 mLs by nebulization every 6 (six) hours as needed (wheezes or SOB).   metoprolol succinate 50 MG 24 hr tablet Commonly known as:  TOPROL-XL Take 1 tablet (50 mg total) by mouth 2 (two) times daily. Take with or immediately following a meal.   montelukast 10 MG tablet Commonly known as:  SINGULAIR TAKE (1) TABLET BY MOUTH AT BEDTIME. What changed:  See the new instructions.   olmesartan 40 MG tablet Commonly known as:  BENICAR TAKE 1 TABLET BY MOUTH ONCE A DAY.   oxyCODONE 5 MG immediate release tablet Commonly known as:  ROXICODONE Take 1 tablet (5 mg total) by mouth every 6 (six) hours as needed.    pantoprazole 40 MG tablet Commonly known as:  PROTONIX TAKE 1 TABLET BY MOUTH ONCE A DAY.   potassium chloride SA 20 MEQ tablet Commonly known as:  K-DUR,KLOR-CON TAKE 1&1/2 TABLET BY MOUTH DAILY. What changed:  See the new instructions.   rosuvastatin 10 MG tablet Commonly known as:  CRESTOR TAKE 1 TABLET BY MOUTH ONCE A DAY. What changed:  when to take this   solifenacin 5 MG tablet Commonly known as:  VESICARE Take 1 tablet (5 mg total) by mouth daily.   SPIRIVA HANDIHALER 18 MCG inhalation capsule Generic drug:  tiotropium Place 18 mcg into inhaler  and inhale daily. 2 puffs   TRADJENTA 5 MG Tabs tablet Generic drug:  linagliptin TAKE 1 TABLET BY MOUTH ONCE DAILY. What changed:  how much to take   traMADol 50 MG tablet Commonly known as:  ULTRAM TAKE (1) TABLET BY MOUTH AT BEDTIME FOR PAIN. What changed:    how much to take  how to take this  when to take this  reasons to take this  additional instructions   traZODone 50 MG tablet Commonly known as:  DESYREL Take 50 mg by mouth at bedtime.       Diagnostic Studies: Dg Hand Complete Right  Result Date: 10/11/2018 CLINICAL DATA:  Dog bite. EXAM: RIGHT HAND - COMPLETE 3+ VIEW COMPARISON:  None. FINDINGS: Generalized osteopenia. No acute fracture or dislocation. Mild osteoarthritis of the first [place] joint. Mild osteoarthritis of the first IP joint. No soft tissue abnormality. IMPRESSION: No acute osseous injury of the right hand. Electronically Signed   By: Kathreen Devoid   On: 10/11/2018 16:05    They benefited maximally from their hospital stay and there were no complications.     Disposition: Discharge disposition: 01-Home or Self Care      Discharge Instructions    AMB Referral to [facility] Management   Complete by:  As directed    Please assign to [facility] for complex case management and DM and COPD. Please assign to [place] Worker for potential transportation assistance and community  resources. Has partial Medicaid. Written consent obtained. Currently at [facility]. Please call with questions. Unplanned readmission risk score of 43% (extreme), multiple hospital admissions. [facility] is listed as doing toc. Thanks. Marthenia Rolling, [place], [facility] [phone]   Reason for consult:  Please assign to [facility] Social Worker   Diagnoses of:   Diabetes COPD/ Pneumonia     Expected date of contact:  1-3 days (reserved for hospital discharges)   Call MD / Call 911   Complete by:  As directed    If you experience chest pain or shortness of breath, CALL 911 and be transported to the hospital emergency room.  If you develope a fever above 101 F, pus (white drainage) or increased drainage or redness at the wound, or calf pain, call your surgeon's office.   Constipation Prevention   Complete by:  As directed    Drink plenty of fluids.  Prune juice may be helpful.  You may use a stool softener, such as Colace (over the counter) 100 mg twice a day.  Use MiraLax (over the counter) for constipation as needed.   Diet - low sodium heart healthy   Complete by:  As directed    Increase activity slowly as tolerated   Complete by:  As directed      Follow-up Information    [name], MD Follow up in 14 day(s).   Specialty:  Orthopedic Surgery Why:  Please call to see Dr. Amedeo Plenty in 14 days Contact information: [address] [place] [address] [phone]           Patient looks quite well at bedside today.  I removed the bandage.  She has improved wound conditions no foul discharge and improved wound parameters.  She has a small opening at the distal portion of her wound and I would recommend a small wet-to-dry dressing change daily to this area.  I would like her to have a small wet-to-dry dressing change daily  and to be placed on Augmentin for 14 days.  I discussed with her these issues and  will give her GI precautions.  She does not live with any family member and states she has minimal ability to change the dressing nurse will try to arrange for home health to come out to her house for dressing changes.  Once again this will be a dressing change daily small wet-to-dry to the affected area.  Only a very small piece of wet gauze followed by dry and a wrap of Kerlix and Kling this should be performed daily.  I discussed these issues with patient.  She may resume all of her regular medicines.  She will follow-up with my office in 10 to 14 days.  At the time of discharge she was awake alert and oriented.  No signs of DVT or urinary tract infection.  No signs of complication.  She was awake alert oriented and tolerating her diet nicely.  All questions have been addressed.  Signed: Satira Anis Stefanie Hodgens III 10/15/2018, 9:18 AM

## 2018-10-15 NOTE — Progress Notes (Signed)
[name] to be D/C'd  per MD order. Discussed with the patient and all questions fully answered.  VSS, Skin clean, dry and intact without evidence of skin break down, no evidence of skin tears noted.  IV catheter discontinued intact. Site without signs and symptoms of complications. Dressing and pressure applied.  An After Visit Summary was printed and given to the patient. Patient informed where to pickup prescription.  D/c education completed with patient/family including follow up instructions, medication list, d/c activities limitations if indicated, with other d/c instructions as indicated by MD - patient able to verbalize understanding, all questions fully answered.   Patient instructed to return to ED, call 911, or call MD for any changes in condition.   Patient to be escorted via [place], and D/C home via private auto.

## 2018-10-15 NOTE — Progress Notes (Signed)
Patient ID: [name], female   DOB: April 07, 1966, 52 y.o.   MRN: [id number] Patient looks quite well at bedside today.  I removed the bandage.  She has improved wound conditions no foul discharge and improved wound parameters.  She has a small opening at the distal portion of her wound and I would recommend a small wet-to-dry dressing change daily to this area.  I would like her to have a small wet-to-dry dressing change daily and to be placed on Augmentin for 14 days.  I discussed with her these issues and will give her GI precautions.  She does not live with any family member and states she has minimal ability to change the dressing nurse will try to arrange for home health to come out to her house for dressing changes.  Once again this will be a dressing change daily small wet-to-dry to the affected area.  Only a very small piece of wet gauze followed by dry and a wrap of Kerlix and Kling this should be performed daily.  I discussed these issues with patient.  She may resume all of her regular medicines.  She will follow-up with my office in 10 to 14 days.  At the time of discharge she was awake alert and oriented.  No signs of DVT or urinary tract infection.  No signs of complication.  She was awake alert oriented and tolerating her diet nicely.  All questions have been addressed.   Louie Flenner MD

## 2018-10-15 NOTE — Care Management Note (Addendum)
Case Management Note  Patient Details  Name: ORIT SANVILLE MRN: [id number] Date of Birth: 1966-04-11  Subjective/Objective:        Pt presented for infected dog bite and had surgical intervention.  Pt to return home alone and needs [facility] for dressing changes.  Pt aware that RN will visit 3x's/week and dressing changes are recommended daily.  Pt states her sister is a CNA and will help on days the RN doesn't visit.    Pt has used [facility] in the past and wishes to use again.           Action/Plan: Pt presented with Medicare [place] list.  Referral called to [facility] with [facility].  [facility] doesn't have RN available until 10/26/18.  Pt advised and chooses [facility].  Referral called to [facility] with [facility].  [facility] Monday.  RN will give patient dressing change supplies to sustain her until Monday.    Expected Discharge Date:  10/15/18               Expected Discharge Plan:  [place]  In-House Referral:     Discharge planning Services  CM Consult  Post Acute Care Choice:  Home Health Choice offered to:  Patient  DME Arranged:  N/A DME Agency:  NA  HH Arranged:  RN [place] Agency:  [facility] [facility] [employer]  Status of Service:  Completed, signed off  If discussed at [employer], dates discussed:    Additional Comments:  Claudie Leach, RN 10/15/2018, 12:48 PM

## 2018-10-15 NOTE — Progress Notes (Signed)
PT Cancellation Note  Patient Details Name: [name] MRN: [id number] DOB: [birth year]   Cancelled Treatment:    Reason Eval/Treat Not Completed: Other (comment).  PT tech was setting up for hydro evaluation at 9 am and physician was in room completing dressing change.  Per physician hydrotherapy was no longer needed and he was d/c pt home (and he just changed the dressing and we were not going to take it off to do hydro and re-dress it).  He agreed that she would not need our session.  Thanks,  [name]. Naraya Stoneberg, PT, DPT  Acute Rehabilitation [phone] pager [phone]) [phone] office     Wells Guiles B Jedd Schulenburg 10/15/2018, 12:55 PM

## 2018-10-16 LAB — AEROBIC/ANAEROBIC CULTURE W GRAM STAIN (SURGICAL/DEEP WOUND)

## 2018-10-17 ENCOUNTER — Other Ambulatory Visit: Payer: Self-pay

## 2018-10-17 ENCOUNTER — Telehealth: Payer: Self-pay

## 2018-10-17 LAB — AEROBIC/ANAEROBIC CULTURE W GRAM STAIN (SURGICAL/DEEP WOUND)

## 2018-10-17 NOTE — Patient Outreach (Signed)
[facility]) Care Management  10/17/2018  [name] Oct 18, 1966 [id number]   Referral received from [place] for complex case management.   Initial outreach unsuccessful. Unable to leave voice message. Per contact instructions, [name] brother was to be notified if she could not be reached. Contacted member's brother [name] at [phone]. Provided RNCM contact information for member to return call.  PLAN Will follow up in 3-4 business days.  [place] [phone]

## 2018-10-17 NOTE — Patient Outreach (Addendum)
[facility]) Care Management  10/17/2018  [name] 08-26-1966 [id number]   Unsuccessful outreach to [name] regarding social work referral for transportation resources.  Number listed in Epic for patient is not valid number.  Per contact instructions, BSW attempted to reach her brother but had to leave voicemail message.  Unsuccessful outreach letter mailed by [facility], Donald Siva, today.  Addendum: BSW received return call from patient's brother, [name].  [name] reported that he is working to get phone service for [name].  He said that he would pass the message to her to call me back regarding referral.  BSW will attempt to reach her again next week if no return call.  Ronn Melena, BSW Social Worker [phone]

## 2018-10-17 NOTE — Telephone Encounter (Signed)
Transition Care Management Follow-up Telephone Call   Date discharged? 10/15/18               How have you been since you were released from the hospital? I'm in a shape! The surgeon's office was supposed to sent someone out here to clean my wound and they haven't    Do you understand why you were in the hospital? Yes, my dog my bit me and it got infected   Do you understand the discharge instructions? Yes but I can't do the dressing change    Where were you discharged to? Home    Items Reviewed:  Medications reviewed: Yes   Allergies reviewed: Yes   Dietary changes reviewed: heart healthy, low sodium   Referrals reviewed: Yes    Functional Questionnaire:   Activities of Daily Living (ADLs):   I cant do anything right now and need them to send someone to clean my wound    Any transportation issues/concerns?: Yes, I have to use [facility]   Any patient concerns? Getting some help with my wound    Confirmed importance and date/time of follow-up visits scheduled Yes      Confirmed with patient if condition begins to worsen call PCP or go to the ER.  Patient was given the office number and encouraged to call back with question or concerns.  :

## 2018-10-20 ENCOUNTER — Other Ambulatory Visit: Payer: Self-pay

## 2018-10-20 NOTE — Patient Outreach (Signed)
[facility]) Care Management  10/20/2018  [name] 09-05-1966 [id number]   Successful outreach with [name]. Reported that she was doing well but preparing for an appointment with Dr. Amedeo Plenty at the time of the call. Contact information provided. Member agreeable to outreach on tomorrow.   PLAN Will attempt outreach on 10/21/18.   [place] [phone]

## 2018-10-21 ENCOUNTER — Telehealth: Payer: Self-pay | Admitting: Family Medicine

## 2018-10-21 ENCOUNTER — Other Ambulatory Visit: Payer: Self-pay

## 2018-10-21 NOTE — Telephone Encounter (Signed)
Pt is calling in she states that she is having a hard time getting back in touch with [name] with she thought was [facility], and she needed home health to come out and help her with the wound changes. I suggested that she reach out to Dr Amedeo Plenty as that was the Dr for the surgery, and if she couldn't get assistance to call us back

## 2018-10-21 NOTE — Care Management (Signed)
Dr Amedeo Plenty [phone] called. Patient has not received home health visit. Per note referral given to [facility] with [place]. Dierdre Searles he will research case and call NCM back.   Magdalen Spatz RN BSN [phone]

## 2018-10-21 NOTE — Patient Outreach (Signed)
[name] Care Management  10/21/2018  Barbara [name] [birth year] [id number]  Voicemail received from Barbara [name] prior to scheduled outreach. Reported she was attempting to reach Barbara [name] regarding services. Reported having a lengthy discussion with home health [name] regarding needs in the home and her daughter's ability to assist with dressing changes. Reported requesting orders for another agency if services and requested assistance could not be provided. She was unable to recall the name of the [name] and could not locate the contact number but stated she would follow up with another member from the agency Barbara [name]).  Discussed referral, [facility] services and care management needs. Member informed that [facility] is not affiliated with [place] and does not replace services provided by the agency. Barbara [name] stated that she does not need care management services. Reported that she no longer requires insulin and her diabetes is well controlled with Tradjenta. Reported that she does not require assistance managing COPD or other illnesses. Stated her shortness of breath was due to smoking and being overweight. Stated she no longer smokes, does not require oxygen therapy and has not been short of breath. Discussed medication needs. Barbara [name] stated that she receives prepackaged doses from [facility] and takes medication as prescribed. Denied concerns regarding medication management or affordability. Requested that [facility] contact pharmacy to ensure pain medications would be refilled. She declined need for [facility] [name] care management services but stated that she will need assistance with transportation. Member informed of pending outreach from [facility] to discuss transportation needs.  FOLLOW-UP Follow-up call placed due to Barbara [name] leaving additional messages for [name] on RNCM's voicemail. Barbara [name] was very apologetic but still unable to recall the  name of the [name] that contacted her. Informed that RNCM would contact agency to inform staff that she was attempting to return a call.    PLAN Will follow up with [facility]. Will update PCP.   [name] [phone]

## 2018-10-21 NOTE — Care Management (Signed)
Spoke with Dr Brigid Re regarding home health. Per note patient was set up with [facility] at discharge. Spoke with Brenton Grills with [facility] he will have home health RN visit today.   Called Dr Brigid Re [phone] and left message.  Magdalen Spatz RN BSN [phone]

## 2018-10-24 ENCOUNTER — Other Ambulatory Visit: Payer: Self-pay

## 2018-10-24 ENCOUNTER — Ambulatory Visit: Payer: Self-pay

## 2018-10-24 NOTE — Patient Outreach (Signed)
[facility]) Care Management  10/24/2018  [name] May 12, 1966 [id number]  Successful outreach to [name] regarding social work referral for transportation resources.  [name] reported that she typically utilizes [facility] but they will not transport to [facility].  She has an upcoming appointment with Dr. Amedeo Plenty on 10/27/18 for stitch removal.  Her daughter is unable to provide transportation for this appointment.  BSW agreed to arrange transportation via [place] for this appointment.  [name] confirmed that she will utilize [facility] for next appointment with Dr. Moshe Cipro on 11/07/18.   [name] biggest concern during our conversation was when [place] will be at her home to assist with wound dressing.  BSW contacted [place] regarding this order.  Per [facility], [name] refused services and reported that she was going to utilize another agency.  BSW contacted RNCM, Neldon [name], about this.  [name] agreed to contact [name] provider for a new order to be issued.  BSW called [name] back to provide pick up time for appointment on 10/27/18 and to inform her that she will be contacted by her provider or [place] regarding dressing changes.  Ronn Melena, BSW Social Worker [phone]

## 2018-10-24 NOTE — Patient Outreach (Signed)
[facility]) Care Management  10/24/2018  [name] [birth year] [id number]    Discipline closure complete. Closure notification sent to [facility] PCP. [facility] updated.    [place] [phone]

## 2018-10-27 DIAGNOSIS — Z5189 Encounter for other specified aftercare: Secondary | ICD-10-CM | POA: Diagnosis not present

## 2018-10-28 ENCOUNTER — Other Ambulatory Visit: Payer: Self-pay

## 2018-10-28 NOTE — Patient Outreach (Signed)
[facility]) Care Management  10/28/2018  YESMIN MUTCH [birth year] [id number]   Incoming call from Ms. Piech who reported that Dr. Amedeo Plenty wants to see her "every day" starting Monday, 10/31/18 for hand wound.  She reported not having transportation.  BSW could not find 1/6 appointment in [place].  Called placed to Dr. Vanetta Shawl office for clarification.  10/31/18 appointment was confirmed but BSW is supposed to receive a return call regarding future appointments.  Transportation arranged via [place] for appointment on Monday.  Will need to seek approval from leadership if transportation to multiple future appointments is needed.   Ronn Melena, BSW Social Worker [phone]

## 2018-11-01 ENCOUNTER — Other Ambulatory Visit: Payer: Self-pay

## 2018-11-01 DIAGNOSIS — S61401A Unspecified open wound of right hand, initial encounter: Secondary | ICD-10-CM | POA: Diagnosis not present

## 2018-11-01 NOTE — Patient Outreach (Signed)
[facility]) Care Management  11/01/2018  [name] 12-13-1965 [id number]   BSW received communication from [facility], Neldon [name], regarding [facility] services and transportation for [name].  Per [name], an order was placed to [place] yesterday by Dr. Amedeo Plenty for dressing change and OT.  [facility] denied this request.  Dr. Amedeo Plenty will place another order today for just dressing change.  BSW arranged transportation via [place] to appointment with Dr. Amedeo Plenty.  BSW will follow up with Dr. Amedeo Plenty or Neldon [name] tomorrow to determine status of request for [facility].   BSW contacted [name] to inform her of pick up time and that another order for [facility] is being placed.    Ronn Melena, BSW Social Worker [phone]

## 2018-11-04 ENCOUNTER — Other Ambulatory Visit: Payer: Self-pay

## 2018-11-04 NOTE — Patient Outreach (Signed)
[facility]) Care Management  11/04/2018  [name] 24-Feb-1966 [id number]   Transportation arranged via [place] for appointment with Dr. Amedeo Plenty on 11/07/18 @ 12:30 PM.  Per Neldon [name], [place] denied the request for [facility] and a request has been sent to [facility].   BSW will follow up next week regarding status of [place] services.  Ronn Melena, BSW Social Worker [phone]

## 2018-11-07 ENCOUNTER — Ambulatory Visit: Payer: Medicare Other | Admitting: Family Medicine

## 2018-11-07 DIAGNOSIS — S61451D Open bite of right hand, subsequent encounter: Secondary | ICD-10-CM | POA: Diagnosis not present

## 2018-11-09 ENCOUNTER — Other Ambulatory Visit: Payer: Self-pay

## 2018-11-09 ENCOUNTER — Ambulatory Visit: Payer: Medicare Other | Admitting: Gastroenterology

## 2018-11-09 NOTE — Patient Outreach (Signed)
[facility]) Care Management  11/09/2018  JHADA RISK 1965/12/10 [id number]    BSW contacted [employer] to determine if Ms. Millirons has transportation benefit and was informed that she does.   BSW contacted Ms. Pawling to inform her of this.  BSW agreed to arrange transportation for appointment tomorrow because [employer] requires 3 days notice. Transportation arranged via [place] for appointment with Dr. Amedeo Plenty on 11/10/18 @ 3:30 PM. BSW informed Ms. Favela that she should utilize [facility] benefit for future appointments.  BSW provided her with contact number for [employer]. Ms. Pesch is supposed to see Dr. Amedeo Plenty every three days for the next three weeks so BSW encouraged her to call after each appointment to schedule next transport.  BSW informed her that this is very important because [facility] cannot continue to provide transportation to these appointments.   BSW will follow up with Ms. Weisheit again within the next three weeks.   Ronn Melena, BSW Social Worker [phone]

## 2018-11-10 DIAGNOSIS — Z5189 Encounter for other specified aftercare: Secondary | ICD-10-CM | POA: Diagnosis not present

## 2018-11-16 ENCOUNTER — Other Ambulatory Visit: Payer: Self-pay | Admitting: Family Medicine

## 2018-11-16 ENCOUNTER — Ambulatory Visit: Payer: Self-pay | Admitting: Family Medicine

## 2018-11-16 DIAGNOSIS — E559 Vitamin D deficiency, unspecified: Secondary | ICD-10-CM

## 2018-11-16 DIAGNOSIS — R6889 Other general symptoms and signs: Secondary | ICD-10-CM | POA: Diagnosis not present

## 2018-11-16 DIAGNOSIS — Z5189 Encounter for other specified aftercare: Secondary | ICD-10-CM | POA: Diagnosis not present

## 2018-11-21 ENCOUNTER — Other Ambulatory Visit (INDEPENDENT_AMBULATORY_CARE_PROVIDER_SITE_OTHER): Payer: Self-pay

## 2018-11-21 ENCOUNTER — Telehealth (INDEPENDENT_AMBULATORY_CARE_PROVIDER_SITE_OTHER): Payer: Self-pay | Admitting: Orthopedic Surgery

## 2018-11-21 DIAGNOSIS — Z5189 Encounter for other specified aftercare: Secondary | ICD-10-CM | POA: Diagnosis not present

## 2018-11-21 DIAGNOSIS — R6889 Other general symptoms and signs: Secondary | ICD-10-CM | POA: Diagnosis not present

## 2018-11-21 MED ORDER — GABAPENTIN 300 MG PO CAPS: 300.0000 mg | ORAL_CAPSULE | Freq: Three times a day (TID) | ORAL | 3 refills | Status: DC

## 2018-11-21 NOTE — Telephone Encounter (Signed)
[facility]  [phone]    Medication refill  Gabapentin

## 2018-11-21 NOTE — Telephone Encounter (Signed)
Ok refill per Dr. Sharol Given this was entered and sent to pharm.

## 2018-11-22 ENCOUNTER — Encounter (HOSPITAL_COMMUNITY)
Admission: AD | Disposition: A | Discharge status: Home Health | Payer: Self-pay | Source: Home / Self Care | Attending: Orthopedic Surgery

## 2018-11-22 ENCOUNTER — Inpatient Hospital Stay (HOSPITAL_COMMUNITY)
Admission: AD | Admit: 2018-11-22 | Discharge: 2018-11-26 | DRG: 577 | Disposition: A | Discharge status: Home Health | Payer: Medicare Other | Attending: Orthopedic Surgery | Admitting: Orthopedic Surgery

## 2018-11-22 ENCOUNTER — Inpatient Hospital Stay (HOSPITAL_COMMUNITY): Payer: Medicare Other | Admitting: Anesthesiology

## 2018-11-22 ENCOUNTER — Other Ambulatory Visit: Payer: Self-pay

## 2018-11-22 ENCOUNTER — Encounter (HOSPITAL_COMMUNITY): Payer: Self-pay | Admitting: Orthopedic Surgery

## 2018-11-22 DIAGNOSIS — E669 Obesity, unspecified: Secondary | ICD-10-CM | POA: Diagnosis present

## 2018-11-22 DIAGNOSIS — N183 Chronic kidney disease, stage 3 (moderate): Secondary | ICD-10-CM | POA: Diagnosis present

## 2018-11-22 DIAGNOSIS — Z7901 Long term (current) use of anticoagulants: Secondary | ICD-10-CM

## 2018-11-22 DIAGNOSIS — Z87828 Personal history of other (healed) physical injury and trauma: Secondary | ICD-10-CM

## 2018-11-22 DIAGNOSIS — E11610 Type 2 diabetes mellitus with diabetic neuropathic arthropathy: Secondary | ICD-10-CM | POA: Diagnosis present

## 2018-11-22 DIAGNOSIS — E785 Hyperlipidemia, unspecified: Secondary | ICD-10-CM | POA: Diagnosis present

## 2018-11-22 DIAGNOSIS — I973 Postprocedural hypertension: Secondary | ICD-10-CM | POA: Diagnosis not present

## 2018-11-22 DIAGNOSIS — I480 Paroxysmal atrial fibrillation: Secondary | ICD-10-CM | POA: Diagnosis present

## 2018-11-22 DIAGNOSIS — I5042 Chronic combined systolic (congestive) and diastolic (congestive) heart failure: Secondary | ICD-10-CM | POA: Diagnosis present

## 2018-11-22 DIAGNOSIS — Z833 Family history of diabetes mellitus: Secondary | ICD-10-CM | POA: Diagnosis not present

## 2018-11-22 DIAGNOSIS — Z8249 Family history of ischemic heart disease and other diseases of the circulatory system: Secondary | ICD-10-CM | POA: Diagnosis not present

## 2018-11-22 DIAGNOSIS — F329 Major depressive disorder, single episode, unspecified: Secondary | ICD-10-CM | POA: Diagnosis present

## 2018-11-22 DIAGNOSIS — E1122 Type 2 diabetes mellitus with diabetic chronic kidney disease: Secondary | ICD-10-CM | POA: Diagnosis present

## 2018-11-22 DIAGNOSIS — Z6832 Body mass index (BMI) 32.0-32.9, adult: Secondary | ICD-10-CM

## 2018-11-22 DIAGNOSIS — F410 Panic disorder [episodic paroxysmal anxiety] without agoraphobia: Secondary | ICD-10-CM | POA: Diagnosis present

## 2018-11-22 DIAGNOSIS — G4730 Sleep apnea, unspecified: Secondary | ICD-10-CM | POA: Diagnosis present

## 2018-11-22 DIAGNOSIS — S61254S Open bite of right ring finger without damage to nail, sequela: Secondary | ICD-10-CM | POA: Diagnosis present

## 2018-11-22 DIAGNOSIS — Z825 Family history of asthma and other chronic lower respiratory diseases: Secondary | ICD-10-CM | POA: Diagnosis not present

## 2018-11-22 DIAGNOSIS — Z8673 Personal history of transient ischemic attack (TIA), and cerebral infarction without residual deficits: Secondary | ICD-10-CM | POA: Diagnosis not present

## 2018-11-22 DIAGNOSIS — Z87891 Personal history of nicotine dependence: Secondary | ICD-10-CM | POA: Diagnosis not present

## 2018-11-22 DIAGNOSIS — W540XXS Bitten by dog, sequela: Secondary | ICD-10-CM

## 2018-11-22 DIAGNOSIS — F411 Generalized anxiety disorder: Secondary | ICD-10-CM | POA: Diagnosis present

## 2018-11-22 DIAGNOSIS — I509 Heart failure, unspecified: Secondary | ICD-10-CM | POA: Diagnosis not present

## 2018-11-22 DIAGNOSIS — S61204A Unspecified open wound of right ring finger without damage to nail, initial encounter: Secondary | ICD-10-CM | POA: Diagnosis not present

## 2018-11-22 DIAGNOSIS — L03011 Cellulitis of right finger: Secondary | ICD-10-CM | POA: Diagnosis not present

## 2018-11-22 DIAGNOSIS — I130 Hypertensive heart and chronic kidney disease with heart failure and stage 1 through stage 4 chronic kidney disease, or unspecified chronic kidney disease: Secondary | ICD-10-CM | POA: Diagnosis present

## 2018-11-22 DIAGNOSIS — W540XXD Bitten by dog, subsequent encounter: Secondary | ICD-10-CM | POA: Diagnosis not present

## 2018-11-22 DIAGNOSIS — S63284A Dislocation of proximal interphalangeal joint of right ring finger, initial encounter: Secondary | ICD-10-CM | POA: Diagnosis not present

## 2018-11-22 HISTORY — PX: I & D EXTREMITY: SHX5045

## 2018-11-22 HISTORY — PX: SKIN SPLIT GRAFT: SHX444

## 2018-11-22 HISTORY — DX: Personal history of other (healed) physical injury and trauma: Z87.828

## 2018-11-22 LAB — GLUCOSE, CAPILLARY
Glucose-Capillary: 100 mg/dL — ABNORMAL HIGH (ref 70–99)
Glucose-Capillary: 100 mg/dL — ABNORMAL HIGH (ref 70–99)
Glucose-Capillary: 137 mg/dL — ABNORMAL HIGH (ref 70–99)

## 2018-11-22 LAB — POCT I-STAT 4, (NA,K, GLUC, HGB,HCT)
Glucose, Bld: 104 mg/dL — ABNORMAL HIGH (ref 70–99)
HCT: 38 % (ref 36.0–46.0)
Hemoglobin: 12.9 g/dL (ref 12.0–15.0)
Potassium: 3.6 mmol/L (ref 3.5–5.1)
Sodium: 143 mmol/L (ref 135–145)

## 2018-11-22 SURGERY — IRRIGATION AND DEBRIDEMENT EXTREMITY
Anesthesia: General | Site: Hand | Laterality: Right

## 2018-11-22 MED ORDER — CEFAZOLIN SODIUM-DEXTROSE 1-4 GM/50ML-% IV SOLN
1.0000 g | INTRAVENOUS | Status: AC
  Administered 2018-11-23: 1 g via INTRAVENOUS
  Filled 2018-11-22: qty 50

## 2018-11-22 MED ORDER — MORPHINE SULFATE (PF) 2 MG/ML IV SOLN
1.0000 mg | INTRAVENOUS | Status: DC | PRN
  Administered 2018-11-23 – 2018-11-24 (×4): 1 mg via INTRAVENOUS
  Filled 2018-11-22 (×4): qty 1

## 2018-11-22 MED ORDER — DEXAMETHASONE SODIUM PHOSPHATE 10 MG/ML IJ SOLN
INTRAMUSCULAR | Status: DC | PRN
  Administered 2018-11-22: 5 mg via INTRAVENOUS

## 2018-11-22 MED ORDER — LACTATED RINGERS IV SOLN
INTRAVENOUS | Status: DC | PRN
  Administered 2018-11-22 (×3): via INTRAVENOUS

## 2018-11-22 MED ORDER — UMECLIDINIUM BROMIDE 62.5 MCG/INH IN AEPB
1.0000 | INHALATION_SPRAY | Freq: Every day | RESPIRATORY_TRACT | Status: DC
  Administered 2018-11-23 – 2018-11-25 (×2): 1 via RESPIRATORY_TRACT
  Filled 2018-11-22: qty 7

## 2018-11-22 MED ORDER — FENTANYL CITRATE (PF) 100 MCG/2ML IJ SOLN
INTRAMUSCULAR | Status: AC
  Administered 2018-11-22: 50 ug via INTRAVENOUS
  Filled 2018-11-22: qty 2

## 2018-11-22 MED ORDER — OXYCODONE HCL 5 MG/5ML PO SOLN: 5.0000 mg | Freq: Once | ORAL | Status: AC | PRN

## 2018-11-22 MED ORDER — DIPHENHYDRAMINE HCL 50 MG/ML IJ SOLN
INTRAMUSCULAR | Status: AC
Start: 2018-11-22 — End: 2018-11-23
  Filled 2018-11-22: qty 1

## 2018-11-22 MED ORDER — PROPOFOL 10 MG/ML IV BOLUS
INTRAVENOUS | Status: DC | PRN
  Administered 2018-11-22: 120 mg via INTRAVENOUS

## 2018-11-22 MED ORDER — ADULT MULTIVITAMIN W/MINERALS CH
1.0000 | ORAL_TABLET | Freq: Every day | ORAL | Status: DC
  Administered 2018-11-23 – 2018-11-26 (×4): 1 via ORAL
  Filled 2018-11-22 (×4): qty 1

## 2018-11-22 MED ORDER — IPRATROPIUM-ALBUTEROL 0.5-2.5 (3) MG/3ML IN SOLN: 3.0000 mL | Freq: Four times a day (QID) | RESPIRATORY_TRACT | Status: DC | PRN

## 2018-11-22 MED ORDER — MONTELUKAST SODIUM 10 MG PO TABS
10.0000 mg | ORAL_TABLET | Freq: Every day | ORAL | Status: DC
  Administered 2018-11-23 – 2018-11-25 (×4): 10 mg via ORAL
  Filled 2018-11-22 (×4): qty 1

## 2018-11-22 MED ORDER — OXYCODONE HCL 5 MG PO TABS
5.0000 mg | ORAL_TABLET | ORAL | Status: DC | PRN
  Administered 2018-11-23 (×2): 10 mg via ORAL
  Administered 2018-11-23: 5 mg via ORAL
  Administered 2018-11-23 – 2018-11-24 (×6): 10 mg via ORAL
  Administered 2018-11-25: 5 mg via ORAL
  Administered 2018-11-25 (×3): 10 mg via ORAL
  Administered 2018-11-26: 5 mg via ORAL
  Administered 2018-11-26 (×3): 10 mg via ORAL
  Filled 2018-11-22 (×18): qty 2

## 2018-11-22 MED ORDER — SUCCINYLCHOLINE CHLORIDE 20 MG/ML IJ SOLN
INTRAMUSCULAR | Status: DC | PRN
  Administered 2018-11-22: 100 mg via INTRAVENOUS

## 2018-11-22 MED ORDER — DOCUSATE SODIUM 100 MG PO CAPS
100.0000 mg | ORAL_CAPSULE | Freq: Two times a day (BID) | ORAL | Status: DC
  Administered 2018-11-23 – 2018-11-26 (×8): 100 mg via ORAL
  Filled 2018-11-22 (×8): qty 1

## 2018-11-22 MED ORDER — INSULIN ASPART 100 UNIT/ML ~~LOC~~ SOLN
0.0000 [IU] | Freq: Three times a day (TID) | SUBCUTANEOUS | Status: DC
  Administered 2018-11-23: 3 [IU] via SUBCUTANEOUS
  Administered 2018-11-23: 7 [IU] via SUBCUTANEOUS

## 2018-11-22 MED ORDER — POTASSIUM CHLORIDE CRYS ER 20 MEQ PO TBCR
30.0000 meq | EXTENDED_RELEASE_TABLET | Freq: Every day | ORAL | Status: DC
  Administered 2018-11-23 – 2018-11-26 (×4): 30 meq via ORAL
  Filled 2018-11-22 (×4): qty 1

## 2018-11-22 MED ORDER — VITAMIN C 500 MG PO TABS
1000.0000 mg | ORAL_TABLET | Freq: Every day | ORAL | Status: DC
  Administered 2018-11-23 – 2018-11-26 (×4): 1000 mg via ORAL
  Filled 2018-11-22 (×4): qty 2

## 2018-11-22 MED ORDER — SODIUM CHLORIDE 0.9 % IR SOLN
Status: DC | PRN
  Administered 2018-11-22: 3000 mL

## 2018-11-22 MED ORDER — ASPIRIN EC 81 MG PO TBEC
81.0000 mg | DELAYED_RELEASE_TABLET | Freq: Every day | ORAL | Status: DC
  Administered 2018-11-23 – 2018-11-26 (×4): 81 mg via ORAL
  Filled 2018-11-22 (×4): qty 1

## 2018-11-22 MED ORDER — ONDANSETRON HCL 4 MG/2ML IJ SOLN: 4.0000 mg | Freq: Four times a day (QID) | INTRAMUSCULAR | Status: DC | PRN

## 2018-11-22 MED ORDER — FENTANYL CITRATE (PF) 250 MCG/5ML IJ SOLN
INTRAMUSCULAR | Status: AC
  Filled 2018-11-22: qty 5

## 2018-11-22 MED ORDER — FENTANYL CITRATE (PF) 100 MCG/2ML IJ SOLN
INTRAMUSCULAR | Status: AC
  Filled 2018-11-22: qty 2

## 2018-11-22 MED ORDER — MINERAL OIL LIGHT 100 % EX OIL
TOPICAL_OIL | CUTANEOUS | Status: DC | PRN
  Administered 2018-11-22 (×2): 1 via TOPICAL

## 2018-11-22 MED ORDER — FENTANYL CITRATE (PF) 100 MCG/2ML IJ SOLN
INTRAMUSCULAR | Status: DC | PRN
  Administered 2018-11-22: 50 ug via INTRAVENOUS
  Administered 2018-11-22: 100 ug via INTRAVENOUS

## 2018-11-22 MED ORDER — ONDANSETRON HCL 4 MG PO TABS: 4.0000 mg | ORAL_TABLET | Freq: Four times a day (QID) | ORAL | Status: DC | PRN

## 2018-11-22 MED ORDER — FAMOTIDINE 20 MG PO TABS: 20.0000 mg | ORAL_TABLET | Freq: Two times a day (BID) | ORAL | Status: DC | PRN

## 2018-11-22 MED ORDER — ROSUVASTATIN CALCIUM 5 MG PO TABS
10.0000 mg | ORAL_TABLET | Freq: Every day | ORAL | Status: DC
  Administered 2018-11-23 – 2018-11-26 (×4): 10 mg via ORAL
  Filled 2018-11-22 (×4): qty 2

## 2018-11-22 MED ORDER — LIDOCAINE HCL (CARDIAC) PF 100 MG/5ML IV SOSY
PREFILLED_SYRINGE | INTRAVENOUS | Status: DC | PRN
  Administered 2018-11-22: 60 mg via INTRAVENOUS

## 2018-11-22 MED ORDER — LINAGLIPTIN 5 MG PO TABS: 5.0000 mg | ORAL_TABLET | Freq: Every day | ORAL | Status: DC

## 2018-11-22 MED ORDER — FENTANYL CITRATE (PF) 100 MCG/2ML IJ SOLN
25.0000 ug | INTRAMUSCULAR | Status: DC | PRN
  Administered 2018-11-22 (×3): 50 ug via INTRAVENOUS

## 2018-11-22 MED ORDER — LABETALOL HCL 5 MG/ML IV SOLN
10.0000 mg | INTRAVENOUS | Status: AC | PRN
  Administered 2018-11-22 (×2): 10 mg via INTRAVENOUS

## 2018-11-22 MED ORDER — ALBUTEROL SULFATE HFA 108 (90 BASE) MCG/ACT IN AERS: 2.0000 | INHALATION_SPRAY | Freq: Four times a day (QID) | RESPIRATORY_TRACT | Status: DC | PRN

## 2018-11-22 MED ORDER — METOPROLOL SUCCINATE ER 50 MG PO TB24
50.0000 mg | ORAL_TABLET | Freq: Two times a day (BID) | ORAL | Status: DC
  Administered 2018-11-23 – 2018-11-26 (×8): 50 mg via ORAL
  Filled 2018-11-22 (×8): qty 1

## 2018-11-22 MED ORDER — LABETALOL HCL 5 MG/ML IV SOLN
INTRAVENOUS | Status: DC | PRN
  Administered 2018-11-22 (×2): 5 mg via INTRAVENOUS

## 2018-11-22 MED ORDER — METOPROLOL SUCCINATE ER 25 MG PO TB24
ORAL_TABLET | ORAL | Status: AC
  Administered 2018-11-22: 50 mg via ORAL
  Filled 2018-11-22: qty 2

## 2018-11-22 MED ORDER — AMLODIPINE BESYLATE 10 MG PO TABS
10.0000 mg | ORAL_TABLET | Freq: Every day | ORAL | Status: DC
  Administered 2018-11-23 – 2018-11-26 (×4): 10 mg via ORAL
  Filled 2018-11-22 (×5): qty 1

## 2018-11-22 MED ORDER — DULOXETINE HCL 60 MG PO CPEP
60.0000 mg | ORAL_CAPSULE | Freq: Every day | ORAL | Status: DC
  Administered 2018-11-23 – 2018-11-26 (×4): 60 mg via ORAL
  Filled 2018-11-22 (×4): qty 1

## 2018-11-22 MED ORDER — FENTANYL CITRATE (PF) 100 MCG/2ML IJ SOLN
50.0000 ug | Freq: Once | INTRAMUSCULAR | Status: AC
  Administered 2018-11-22: 50 ug via INTRAVENOUS

## 2018-11-22 MED ORDER — AMIODARONE HCL 200 MG PO TABS
200.0000 mg | ORAL_TABLET | Freq: Two times a day (BID) | ORAL | Status: DC
  Administered 2018-11-23 – 2018-11-26 (×8): 200 mg via ORAL
  Filled 2018-11-22 (×9): qty 1

## 2018-11-22 MED ORDER — SUCCINYLCHOLINE 20MG/ML (10ML) SYRINGE FOR MEDFUSION PUMP - OPTIME
INTRAMUSCULAR | Status: DC | PRN
  Administered 2018-11-22: 100 mg via INTRAVENOUS

## 2018-11-22 MED ORDER — LABETALOL HCL 5 MG/ML IV SOLN
INTRAVENOUS | Status: AC
  Filled 2018-11-22: qty 4

## 2018-11-22 MED ORDER — 0.9 % SODIUM CHLORIDE (POUR BTL) OPTIME
TOPICAL | Status: DC | PRN
  Administered 2018-11-22: 1000 mL

## 2018-11-22 MED ORDER — DARIFENACIN HYDROBROMIDE ER 7.5 MG PO TB24
7.5000 mg | ORAL_TABLET | Freq: Every day | ORAL | Status: DC
  Administered 2018-11-23 – 2018-11-26 (×4): 7.5 mg via ORAL
  Filled 2018-11-22 (×4): qty 1

## 2018-11-22 MED ORDER — LACTATED RINGERS IV SOLN
INTRAVENOUS | Status: DC
  Administered 2018-11-22: 19:00:00 via INTRAVENOUS

## 2018-11-22 MED ORDER — DIPHENHYDRAMINE HCL 50 MG/ML IJ SOLN
12.5000 mg | Freq: Once | INTRAMUSCULAR | Status: AC
  Administered 2018-11-22: 12.5 mg via INTRAVENOUS

## 2018-11-22 MED ORDER — BUPIVACAINE HCL (PF) 0.25 % IJ SOLN
INTRAMUSCULAR | Status: AC
  Filled 2018-11-22: qty 20

## 2018-11-22 MED ORDER — TRAZODONE HCL 50 MG PO TABS
50.0000 mg | ORAL_TABLET | Freq: Every day | ORAL | Status: DC
  Administered 2018-11-23 – 2018-11-25 (×4): 50 mg via ORAL
  Filled 2018-11-22 (×4): qty 1

## 2018-11-22 MED ORDER — HYDRALAZINE HCL 20 MG/ML IJ SOLN: 10.0000 mg | INTRAMUSCULAR | Status: DC | PRN

## 2018-11-22 MED ORDER — CEFAZOLIN SODIUM-DEXTROSE 2-4 GM/100ML-% IV SOLN
2.0000 g | Freq: Once | INTRAVENOUS | Status: AC
  Administered 2018-11-22: 2 g via INTRAVENOUS
  Filled 2018-11-22: qty 100

## 2018-11-22 MED ORDER — OXYCODONE HCL 5 MG PO TABS
5.0000 mg | ORAL_TABLET | Freq: Once | ORAL | Status: AC | PRN
  Administered 2018-11-22: 5 mg via ORAL

## 2018-11-22 MED ORDER — AMOXICILLIN-POT CLAVULANATE 875-125 MG PO TABS
1.0000 | ORAL_TABLET | Freq: Two times a day (BID) | ORAL | Status: DC
  Administered 2018-11-23 – 2018-11-26 (×7): 1 via ORAL
  Filled 2018-11-22 (×7): qty 1

## 2018-11-22 MED ORDER — CLOPIDOGREL BISULFATE 75 MG PO TABS
75.0000 mg | ORAL_TABLET | Freq: Every day | ORAL | Status: DC
  Administered 2018-11-23: 75 mg via ORAL
  Filled 2018-11-22: qty 1

## 2018-11-22 MED ORDER — ONDANSETRON HCL 4 MG/2ML IJ SOLN
INTRAMUSCULAR | Status: DC | PRN
  Administered 2018-11-22: 4 mg via INTRAVENOUS

## 2018-11-22 MED ORDER — OXYCODONE HCL 5 MG PO TABS
ORAL_TABLET | ORAL | Status: AC
Start: 2018-11-22 — End: 2018-11-23
  Filled 2018-11-22: qty 1

## 2018-11-22 MED ORDER — MINERAL OIL LIGHT 100 % EX OIL
TOPICAL_OIL | Freq: Once | CUTANEOUS | Status: DC
  Filled 2018-11-22 (×2): qty 25

## 2018-11-22 MED ORDER — METOPROLOL SUCCINATE ER 50 MG PO TB24
50.0000 mg | ORAL_TABLET | Freq: Once | ORAL | Status: AC
  Administered 2018-11-22: 50 mg via ORAL
  Filled 2018-11-22: qty 1

## 2018-11-22 MED ORDER — PANTOPRAZOLE SODIUM 40 MG PO TBEC
40.0000 mg | DELAYED_RELEASE_TABLET | Freq: Every day | ORAL | Status: DC
  Administered 2018-11-23 – 2018-11-26 (×4): 40 mg via ORAL
  Filled 2018-11-22 (×4): qty 1

## 2018-11-22 MED ORDER — EZETIMIBE 10 MG PO TABS
10.0000 mg | ORAL_TABLET | Freq: Every day | ORAL | Status: DC
  Administered 2018-11-23 – 2018-11-26 (×4): 10 mg via ORAL
  Filled 2018-11-22 (×4): qty 1

## 2018-11-22 MED ORDER — LACTATED RINGERS IV SOLN
INTRAVENOUS | Status: DC
  Administered 2018-11-22: via INTRAVENOUS

## 2018-11-22 MED ORDER — CEFAZOLIN SODIUM-DEXTROSE 1-4 GM/50ML-% IV SOLN
1.0000 g | Freq: Three times a day (TID) | INTRAVENOUS | Status: DC
  Administered 2018-11-23 – 2018-11-24 (×3): 1 g via INTRAVENOUS
  Filled 2018-11-22 (×4): qty 50

## 2018-11-22 SURGICAL SUPPLY — 65 items
BANDAGE ACE 4X5 VEL STRL LF (GAUZE/BANDAGES/DRESSINGS) ×2 IMPLANT
BANDAGE ACE 6X5 VEL STRL LF (GAUZE/BANDAGES/DRESSINGS) IMPLANT
BLADE CLIPPER SURG (BLADE) IMPLANT
BLADE DERMATONE (BLADE) IMPLANT
BNDG CONFORM 2 STRL LF (GAUZE/BANDAGES/DRESSINGS) IMPLANT
BNDG GAUZE ELAST 4 BULKY (GAUZE/BANDAGES/DRESSINGS) ×5 IMPLANT
CORDS BIPOLAR (ELECTRODE) ×2 IMPLANT
COVER SURGICAL LIGHT HANDLE (MISCELLANEOUS) ×2 IMPLANT
COVER WAND RF STERILE (DRAPES) ×2 IMPLANT
CUFF TOURNIQUET SINGLE 18IN (TOURNIQUET CUFF) ×2 IMPLANT
CUFF TOURNIQUET SINGLE 24IN (TOURNIQUET CUFF) IMPLANT
DERMACARRIERS GRAFT 1 TO 1.5 (DISPOSABLE)
DRSG ADAPTIC 3X8 NADH LF (GAUZE/BANDAGES/DRESSINGS) ×2 IMPLANT
DRSG MEPITEL 4X7.2 (GAUZE/BANDAGES/DRESSINGS) ×2 IMPLANT
DRSG PAD ABDOMINAL 8X10 ST (GAUZE/BANDAGES/DRESSINGS) IMPLANT
DRSG XEROFORM 1X8 (GAUZE/BANDAGES/DRESSINGS) ×1 IMPLANT
ELECT REM PT RETURN 9FT ADLT (ELECTROSURGICAL)
ELECTRODE REM PT RTRN 9FT ADLT (ELECTROSURGICAL) IMPLANT
GAUZE SPONGE 4X4 12PLY STRL (GAUZE/BANDAGES/DRESSINGS) ×2 IMPLANT
GAUZE SPONGE 4X4 16PLY XRAY LF (GAUZE/BANDAGES/DRESSINGS) ×1 IMPLANT
GAUZE XEROFORM 1X8 LF (GAUZE/BANDAGES/DRESSINGS) ×2 IMPLANT
GAUZE XEROFORM 5X9 LF (GAUZE/BANDAGES/DRESSINGS) ×1 IMPLANT
GLOVE BIOGEL M 8.0 STRL (GLOVE) ×2 IMPLANT
GLOVE SS BIOGEL STRL SZ 8 (GLOVE) ×1 IMPLANT
GLOVE SUPERSENSE BIOGEL SZ 8 (GLOVE) ×1
GOWN STRL REUS W/ TWL LRG LVL3 (GOWN DISPOSABLE) ×1 IMPLANT
GOWN STRL REUS W/ TWL XL LVL3 (GOWN DISPOSABLE) ×2 IMPLANT
GOWN STRL REUS W/TWL LRG LVL3 (GOWN DISPOSABLE) ×2
GOWN STRL REUS W/TWL XL LVL3 (GOWN DISPOSABLE) ×4
GRAFT DERMACARRIERS 1 TO 1.5 (DISPOSABLE) IMPLANT
KIT BASIN OR (CUSTOM PROCEDURE TRAY) ×2 IMPLANT
KIT TURNOVER KIT B (KITS) ×2 IMPLANT
MANIFOLD NEPTUNE II (INSTRUMENTS) ×2 IMPLANT
NDL HYPO 25GX1X1/2 BEV (NEEDLE) IMPLANT
NEEDLE HYPO 25GX1X1/2 BEV (NEEDLE) IMPLANT
NS IRRIG 1000ML POUR BTL (IV SOLUTION) ×2 IMPLANT
PACK ORTHO EXTREMITY (CUSTOM PROCEDURE TRAY) ×2 IMPLANT
PAD ARMBOARD 7.5X6 YLW CONV (MISCELLANEOUS) ×4 IMPLANT
PAD CAST 4YDX4 CTTN HI CHSV (CAST SUPPLIES) ×1 IMPLANT
PADDING CAST COTTON 4X4 STRL (CAST SUPPLIES) ×2
PADDING CAST COTTON 6X4 STRL (CAST SUPPLIES) IMPLANT
PADDING CAST SYNTHETIC 4 (CAST SUPPLIES) ×1
PADDING CAST SYNTHETIC 4X4 STR (CAST SUPPLIES) IMPLANT
SCRUB BETADINE 4OZ XXX (MISCELLANEOUS) ×2 IMPLANT
SET CYSTO W/LG BORE CLAMP LF (SET/KITS/TRAYS/PACK) ×2 IMPLANT
SOL PREP POV-IOD 4OZ 10% (MISCELLANEOUS) ×2 IMPLANT
SPLINT PLASTER EXTRA FAST 3X15 (CAST SUPPLIES) ×1
SPLINT PLASTER GYPS XFAST 3X15 (CAST SUPPLIES) IMPLANT
SPONGE LAP 18X18 X RAY DECT (DISPOSABLE) ×6 IMPLANT
SPONGE LAP 4X18 RFD (DISPOSABLE) ×2 IMPLANT
STAPLER VISISTAT 35W (STAPLE) IMPLANT
SUT CHROMIC 4 0 P 3 18 (SUTURE) ×2 IMPLANT
SUT CHROMIC 5 0 P 3 (SUTURE) ×1 IMPLANT
SUT PROLENE 4 0 P 3 18 (SUTURE) ×2 IMPLANT
SUT PROLENE 4 0 PS 2 18 (SUTURE) ×2 IMPLANT
SUT PROLENE 5 0 P 3 (SUTURE) ×1 IMPLANT
SWAB CULTURE ESWAB REG 1ML (MISCELLANEOUS) IMPLANT
SYR CONTROL 10ML LL (SYRINGE) IMPLANT
TOWEL OR 17X24 6PK STRL BLUE (TOWEL DISPOSABLE) ×2 IMPLANT
TOWEL OR 17X26 10 PK STRL BLUE (TOWEL DISPOSABLE) ×2 IMPLANT
TUBE CONNECTING 12X1/4 (SUCTIONS) ×2 IMPLANT
TUBING TUR DISP (UROLOGICAL SUPPLIES) IMPLANT
UNDERPAD 30X30 (UNDERPADS AND DIAPERS) ×2 IMPLANT
WATER STERILE IRR 1000ML POUR (IV SOLUTION) ×2 IMPLANT
YANKAUER SUCT BULB TIP NO VENT (SUCTIONS) ×2 IMPLANT

## 2018-11-22 NOTE — Transfer of Care (Signed)
Immediate Anesthesia Transfer of Care Note  Patient: [name]  Procedure(s) Performed: IRRIGATION AND DEBRIDEMENT AND PINNING RIGHT HAND (Right Hand) SKIN GRAFT SPLIT THICKNESS (Right Hand)  Patient Location: PACU  Anesthesia Type:General  Level of Consciousness: awake, alert , oriented and patient cooperative  Airway & Oxygen Therapy: Patient Spontanous Breathing  Post-op Assessment: Report given to RN and Post -op Vital signs reviewed and stable  Post vital signs: Reviewed and stable  Last Vitals:  Vitals Value Taken Time  BP 174/104 11/22/2018  9:40 PM  Temp    Pulse 64 11/22/2018  9:44 PM  Resp 29 11/22/2018  9:44 PM  SpO2 97 % 11/22/2018  9:44 PM  Vitals shown include unvalidated device data.  Last Pain:  Vitals:   11/22/18 1943  TempSrc:   PainSc: 8       Patients Stated Pain Goal: 3 (86/75/44 9201)  Complications: No apparent anesthesia complications

## 2018-11-22 NOTE — Consult Note (Signed)
Medical Consultation   [name]  KWI:[id number]  DOB: 08-21-1966  DOA: 11/22/2018  PCP: [name], MD   Requesting physician:  Dr. [name], Orthopedics  Reason for consultation: Medical management patient's CHF, diabetes, hypertension, atrial fibrillation   History of Present Illness: ICHELLE HARRAL is an 53 y.o. female with medical history significant for chronic combined systolic and diastolic CHF (EF 24-26%, S3MH, diffuse hypokinesis by TTE 05/2017), Atrial Fibrillation, COPD, T2DM, HTN, HLD, CKD Stage III who is admitted for repeat irrigation and debridement and pinning right hand wound with surgical skin grafting performed by orthopedics, Dr. Amedeo Plenty.   Initial wound occurred by dog bite approximately 6 weeks ago and underwent I&D on 10/11/2018 and 10/13/2018 and has been on wound VAC as an outpatient prior to this procedure.  Patient seen by me in the PACU postop.  She currently denies any fevers, chills, diaphoresis, chest pain, palpitations, diaphoresis, abdominal pain, diarrhea or constipation, dysuria, or peripheral edema.  She states that she has been taking her outpatient medications regularly episode for today prior to her procedure.   Review of Systems:  As per HPI otherwise 10 point review of systems negative.   Past Medical History: Past Medical History:  Diagnosis Date  . Alcohol use   . Ankle fracture, lateral malleolus, closed 12/30/2011  . Anxiety   . Breast mass, left 12/15/2011  . Chronic anemia   . Chronic combined systolic and diastolic CHF (congestive heart failure) ([place])   . CKD (chronic kidney disease), stage III ([place])    pt denies  . Cocaine abuse ([place])   . COPD (chronic obstructive pulmonary disease) ([place])   . Diabetes mellitus, type 2 ([place])   . Diabetic Charcot foot ([place]) 12/30/2011  . Essential hypertension   . History of GI bleed 12/05/2009   Qualifier: Diagnosis of  By: Zeb Comfort    . Hyperlipidemia   .  Hypertensive cardiomyopathy ([place]) 11/08/2012   a. 05/2016: echo showing EF of 20-25% with normal cors by cath in 07/2016.  [place] Noncompliance with medication regimen   . Obesity   . Panic attacks   . PAT (paroxysmal atrial tachycardia) ([place])    a. started on amiodarone 07/2017 for this.  . Sleep apnea    does not use cpap  . Stroke ([place])    06/24/17  . Tobacco abuse   . Urinary incontinence     Past Surgical History: Past Surgical History:  Procedure Laterality Date  . BREAST BIOPSY    . CARDIAC CATHETERIZATION N/A 07/28/2016   Procedure: Left Heart Cath and Coronary Angiography;  Surgeon: Jettie Booze, MD;  Location: [facility];  Service: Cardiovascular;  Laterality: N/A;  . DILATION AND CURETTAGE OF UTERUS    . I&D EXTREMITY Bilateral 09/22/2017   Procedure: BILATERAL DEBRIDEMENT LEG/FOOT ULCERS, APPLY VERAFLO WOUND VAC;  Surgeon: Newt Minion, MD;  Location: [facility];  Service: Orthopedics;  Laterality: Bilateral;  . I&D EXTREMITY Right 10/11/2018   Procedure: IRRIGATION AND DEBRIDEMENT RIGHT HAND;  Surgeon: [name], MD;  Location: [place];  Service: Orthopedics;  Laterality: Right;  . I&D EXTREMITY Right 10/13/2018   Procedure: REPEAT IRRIGATION AND DEBRIDEMENT RIGHT HAND;  Surgeon: [name], MD;  Location: [facility];  Service: Orthopedics;  Laterality: Right;  . IR RADIOLOGIST EVAL & MGMT  07/05/2018  . SKIN SPLIT GRAFT Bilateral 09/28/2017   Procedure: BILATERAL SPLIT THICKNESS  SKIN GRAFT LEGS/FEET AND APPLY VAC;  Surgeon: Newt Minion, MD;  Location: [facility];  Service: Orthopedics;  Laterality: Bilateral;     Allergies:  No Known Allergies   Social History:  reports that she quit smoking about 17 months ago. Her smoking use included cigarettes. She has a 20.00 pack-year smoking history. She has never used smokeless tobacco. She reports previous drug use. Frequency: 3.00 times per week. Drugs: Marijuana and Cocaine. She reports that she does not drink  alcohol.   Family History: Family History  Problem Relation Age of Onset  . Hypertension Mother   . Heart attack Mother   . Hypertension Father        CABG   . Hypertension Sister   . Hypertension Brother   . Hypertension Sister   . Cancer Sister        breast   . Arthritis Other   . Cancer Other   . Diabetes Other   . Asthma Other   . Hypertension Daughter   . Hypertension Son       Physical Exam: Vitals:   11/22/18 2226 11/22/18 2241 11/22/18 2255 11/22/18 2257  BP: (!) 184/112 (!) 191/108 (!) 164/137 (!) 152/121  Pulse:  78 74 82  Resp: 19 16 20 17   Temp:      TempSrc:      SpO2: 100% 100% 100% 99%  Weight:      Height:        Constitutional: Obese woman resting supine in bed, alert and awake, oriented x3, not in any acute distress. Eyes: PERRL, EOMI, anicteric sclera,  ENMT: Lips appears normal, oropharynx mucosa, tongue, posterior pharynx appear normal  Neck: neck appears normal, no masses, normal ROM CVS: S1-S2 clear with regular rhythm, no murmur rubs or gallops, no LE edema, normal pedal pulses  Respiratory:  clear to auscultation bilaterally, no wheezing, rales or rhonchi. Respiratory effort normal. No accessory muscle use.  Abdomen: soft nontender, nondistended, normal bowel sounds, no hepatosplenomegaly Musculoskeletal: right hand surgically wrapped, moving all extremities Neuro: Cranial nerves II-XII intact, strength and sensation intact Psych: Alert and oriented x3, judgement and insight appear normal, stable mood and affect Skin: no apparent rashes or lesions or ulcers, no induration or nodules    Data reviewed:  I have personally reviewed following labs and imaging studies Labs:  CBC: Recent Labs  Lab 11/22/18 1900  HGB 12.9  HCT 34.1    Basic Metabolic Panel: Recent Labs  Lab 11/22/18 1900  NA 143  K 3.6  GLUCOSE 104*   GFR CrCl cannot be calculated (Patient's most recent lab result is older than the maximum 21 days  allowed.). Liver Function Tests: No results for input(s): AST, ALT, ALKPHOS, BILITOT, PROT, ALBUMIN in the last 168 hours. No results for input(s): LIPASE, AMYLASE in the last 168 hours. No results for input(s): AMMONIA in the last 168 hours. Coagulation profile No results for input(s): INR, PROTIME in the last 168 hours.  Cardiac Enzymes: No results for input(s): CKTOTAL, CKMB, CKMBINDEX, TROPONINI in the last 168 hours. BNP: Invalid input(s): POCBNP CBG: Recent Labs  Lab 11/22/18 1850 11/22/18 2142  GLUCAP 100* 100*   D-Dimer No results for input(s): DDIMER in the last 72 hours. Hgb A1c No results for input(s): HGBA1C in the last 72 hours. Lipid Profile No results for input(s): CHOL, HDL, LDLCALC, TRIG, CHOLHDL, LDLDIRECT in the last 72 hours. Thyroid function studies No results for input(s): TSH, T4TOTAL, T3FREE, THYROIDAB in the last 72 hours.  Invalid input(s): FREET3 Anemia work up No results for input(s): VITAMINB12, FOLATE, FERRITIN, TIBC, IRON, RETICCTPCT in the last 72 hours. Urinalysis    Component Value Date/Time   COLORURINE AMBER (A) 07/11/2018 1609   APPEARANCEUR CLOUDY (A) 07/11/2018 1609   LABSPEC 1.013 07/11/2018 1609   PHURINE 6.0 07/11/2018 1609   GLUCOSEU 50 (A) 07/11/2018 1609   HGBUR LARGE (A) 07/11/2018 1609   BILIRUBINUR NEGATIVE 07/11/2018 1609   KETONESUR NEGATIVE 07/11/2018 1609   PROTEINUR 100 (A) 07/11/2018 1609   UROBILINOGEN 0.2 09/11/2014 1319   NITRITE NEGATIVE 07/11/2018 1609   LEUKOCYTESUR TRACE (A) 07/11/2018 1609     Microbiology No results found for this or any previous visit (from the past 240 hour(s)).     Inpatient Medications:   Scheduled Meds: . amiodarone  200 mg Oral BID  . [START ON 11/23/2018] amLODipine  10 mg Oral Daily  . amoxicillin-clavulanate  1 tablet Oral BID  . [START ON 11/23/2018] aspirin EC  81 mg Oral Daily  . [START ON 11/23/2018] clopidogrel  75 mg Oral Daily  . [START ON 11/23/2018] darifenacin   7.5 mg Oral Daily  . diphenhydrAMINE      . docusate sodium  100 mg Oral BID  . [START ON 11/23/2018] DULoxetine  60 mg Oral Daily  . [START ON 11/23/2018] ezetimibe  10 mg Oral Daily  . fentaNYL      . fentaNYL      . [START ON 11/23/2018] insulin aspart  0-9 Units Subcutaneous TID WC  . labetalol      . metoprolol succinate  50 mg Oral BID  . montelukast  10 mg Oral QHS  . [START ON 11/23/2018] multivitamin with minerals  1 tablet Oral Daily  . oxyCODONE      . [START ON 11/23/2018] pantoprazole  40 mg Oral Daily  . [START ON 11/23/2018] potassium chloride SA  30 mEq Oral Daily  . [START ON 11/23/2018] rosuvastatin  10 mg Oral Daily  . traZODone  50 mg Oral QHS  . [START ON 11/23/2018] vitamin C  1,000 mg Oral Daily   Continuous Infusions: .  ceFAZolin (ANCEF) IV    . [START ON 11/23/2018]  ceFAZolin (ANCEF) IV    . lactated ringers       Radiological Exams on Admission: No results found.  Impression/Recommendations Active Problems:   H/O open hand wound VERMELLE CAMMARATA is an 53 y.o. female with medical history significant for chronic combined systolic and diastolic CHF (EF 23-76%, E8BT, diffuse hypokinesis by TTE 05/2017), Atrial Fibrillation, COPD, T2DM, HTN, HLD, CKD Stage III who is admitted for repeat irrigation and debridement and pinning right hand wound with surgical skin grafting performed by orthopedics, Dr. Amedeo Plenty.  Type 2 diabetes:  On Tradjenta as an outpatient.  Last A1c 5.3 07/26/2018. -Will hold Tradjenta for now and keep on sensitive SSI with CBG checks while inpatient  Chronic combined systolic and diastolic CHF:  EF 51-76%, G2DD, diffuse hypokinesis by TTE 05/2017.  She is euvolemic on exam with no signs of acute CHF exacerbation. -Continue Toprol-XL, can likely resume home Lasix tomorrow -Monitor I/O's and daily weights  Hypertension: She is hypertensive postop. -Continue home amlodipine, Toprol-XL; will add as needed IV hydralazine  Paroxysmal atrial  fibrillation: Chadsvasc score at least 6.  Home apixaban on hold, can likely resume after 1 day postop as long as maintains hemostasis.  Rate controlled on exam, continue Toprol-XL.  Open right hand wound status post I&D and surgical skin  grafting by Dr. Amedeo Plenty 11/22/18: Currently on Augmentin, continue postop care per orthopedics.  CKD stage III: Will obtain basic metabolic panel in a.m.  Hx of CVA: Continue aspirin and Plavix.  Depression/GAD: Continue Cymbalta.  COPD: Currently stable.  Continue Spiriva, Singulair, and as needed nebulizers.  HLD: Continue Crestor and Zetia.  Thank you for this consultation.  Our [facility] hospitalist team will follow the patient with you.  Zada Finders M.D. [facility] 11/22/2018, 11:24 PM [phone]

## 2018-11-22 NOTE — Op Note (Signed)
See dictation#[id number] Amedeo Plenty MD

## 2018-11-22 NOTE — H&P (Signed)
[name] is an 53 y.o. female.   Chief Complaint: Chronic open wound right finger as sequelae secondary to infection.  Patient now has a granulation bed which is ready and acceptable for surgical skin grafting. HPI: We are planning surgery for your upper extremity. The risk and benefits of surgery to include risk of bleeding, infection, anesthesia,  damage to normal structures and failure of the surgery to accomplish its intended goals of relieving symptoms and restoring function have been discussed in detail. With this in mind we plan to proceed. I have specifically discussed with the patient the pre-and postoperative regime and the dos and don'ts and risk and benefits in great detail. Risk and benefits of surgery also include risk of dystrophy(CRPS), chronic nerve pain, failure of the healing process to go onto completion and other inherent risks of surgery The relavent the pathophysiology of the disease/injury process, as well as the alternatives for treatment and postoperative course of action has been discussed in great detail with the patient who desires to proceed.  We will do everything in our power to help you (the patient) restore function to the upper extremity. It is a pleasure to see this patient today.  Patient presents for evaluation and treatment of the of their upper extremity predicament. The patient denies neck, back, chest or  abdominal pain. The patient notes that they have no lower extremity problems. The patients primary complaint is noted. We are planning surgical care pathway for the upper extremity.  Past Medical History:  Diagnosis Date  . Alcohol use   . Ankle fracture, lateral malleolus, closed 12/30/2011  . Anxiety   . Breast mass, left 12/15/2011  . Chronic anemia   . Chronic combined systolic and diastolic CHF (congestive heart failure) ([place])   . CKD (chronic kidney disease), stage III ([place])    pt denies  . Cocaine abuse ([place])   . COPD (chronic obstructive  pulmonary disease) ([place])   . Diabetes mellitus, type 2 ([place])   . Diabetic Charcot foot ([place]) 12/30/2011  . Essential hypertension   . History of GI bleed 12/05/2009   Qualifier: Diagnosis of  By: Zeb Comfort    . Hyperlipidemia   . Hypertensive cardiomyopathy ([place]) 11/08/2012   a. 05/2016: echo showing EF of 20-25% with normal cors by cath in 07/2016.  Marland Kitchen Noncompliance with medication regimen   . Obesity   . Panic attacks   . PAT (paroxysmal atrial tachycardia) ([place])    a. started on amiodarone 07/2017 for this.  . Sleep apnea    does not use cpap  . Stroke ([place])    06/24/17  . Tobacco abuse   . Urinary incontinence     Past Surgical History:  Procedure Laterality Date  . BREAST BIOPSY    . CARDIAC CATHETERIZATION N/A 07/28/2016   Procedure: Left Heart Cath and Coronary Angiography;  Surgeon: Jettie Booze, MD;  Location: [facility];  Service: Cardiovascular;  Laterality: N/A;  . DILATION AND CURETTAGE OF UTERUS    . I&D EXTREMITY Bilateral 09/22/2017   Procedure: BILATERAL DEBRIDEMENT LEG/FOOT ULCERS, APPLY VERAFLO WOUND VAC;  Surgeon: Newt Minion, MD;  Location: [facility];  Service: Orthopedics;  Laterality: Bilateral;  . I&D EXTREMITY Right 10/11/2018   Procedure: IRRIGATION AND DEBRIDEMENT RIGHT HAND;  Surgeon: [name], MD;  Location: [facility];  Service: Orthopedics;  Laterality: Right;  . I&D EXTREMITY Right 10/13/2018   Procedure: REPEAT IRRIGATION AND DEBRIDEMENT RIGHT HAND;  Surgeon: [name], MD;  Location: [facility];  Service: Orthopedics;  Laterality: Right;  . IR RADIOLOGIST EVAL & MGMT  07/05/2018  . SKIN SPLIT GRAFT Bilateral 09/28/2017   Procedure: BILATERAL SPLIT THICKNESS SKIN GRAFT LEGS/FEET AND APPLY VAC;  Surgeon: Newt Minion, MD;  Location: [facility];  Service: Orthopedics;  Laterality: Bilateral;    Family History  Problem Relation Age of Onset  . Hypertension Mother   . Heart attack Mother   . Hypertension Father        CABG   .  Hypertension Sister   . Hypertension Brother   . Hypertension Sister   . Cancer Sister        breast   . Arthritis Other   . Cancer Other   . Diabetes Other   . Asthma Other   . Hypertension Daughter   . Hypertension Son    Social History:  reports that she quit smoking about 17 months ago. Her smoking use included cigarettes. She has a 20.00 pack-year smoking history. She has never used smokeless tobacco. She reports previous drug use. Frequency: 3.00 times per week. Drugs: Marijuana and Cocaine. She reports that she does not drink alcohol.  Allergies: No Known Allergies  Medications Prior to Admission  Medication Sig Dispense Refill  . albuterol (PROAIR HFA) 108 (90 Base) MCG/ACT inhaler INHALE 2 PUFFS EVERY 6 HOURS AS NEEDED FOR SHORTNESS OF BREATH/WHEEZING. (Patient taking differently: Inhale 2 puffs into the lungs every 6 (six) hours as needed for wheezing or shortness of breath. ) 8.5 g 2  . amiodarone (PACERONE) 200 MG tablet Take 1 tablet (200 mg total) by mouth 2 (two) times daily. (Patient not taking: Reported on 10/11/2018) 60 tablet 2  . amLODipine (NORVASC) 10 MG tablet Take 1 tablet (10 mg total) by mouth daily. 30 tablet 5  . apixaban (ELIQUIS) 5 MG TABS tablet Take 1 tablet (5 mg total) by mouth 2 (two) times daily. (Patient not taking: Reported on 10/11/2018)    . aspirin EC 81 MG tablet Take 1 tablet (81 mg total) by mouth daily. (Patient not taking: Reported on 10/11/2018) 150 tablet 2  . clopidogrel (PLAVIX) 75 MG tablet     . ezetimibe (ZETIA) 10 MG tablet TAKE 1 TABLET BY MOUTH ONCE A DAY. (Patient taking differently: Take 10 mg by mouth daily. ) 30 tablet 6  . FLUoxetine (PROZAC) 40 MG capsule Take 1 capsule (40 mg total) by mouth daily. (Patient not taking: Reported on 10/11/2018) 30 capsule 3  . furosemide (LASIX) 20 MG tablet TAKE 1 TABLET BY MOUTH ONCE A DAY. (Patient taking differently: Take 20 mg by mouth daily. ) 30 tablet 5  . gabapentin (NEURONTIN) 300 MG  capsule Take 1 capsule (300 mg total) by mouth 3 (three) times daily. 3 times a day when necessary neuropathy pain 90 capsule 3  . ipratropium-albuterol (DUONEB) 0.5-2.5 (3) MG/3ML SOLN Take 3 mLs by nebulization every 6 (six) hours as needed (wheezes or SOB). 360 mL 1  . metoprolol succinate (TOPROL-XL) 50 MG 24 hr tablet Take 1 tablet (50 mg total) by mouth 2 (two) times daily. Take with or immediately following a meal. (Patient not taking: Reported on 10/11/2018) 60 tablet 0  . montelukast (SINGULAIR) 10 MG tablet TAKE (1) TABLET BY MOUTH AT BEDTIME. (Patient taking differently: Take 10 mg by mouth at bedtime. TAKE (1) TABLET BY MOUTH AT BEDTIME.) 30 tablet 6  . olmesartan (BENICAR) 40 MG tablet TAKE 1 TABLET BY MOUTH ONCE A DAY. 30 tablet 0  .  oxyCODONE (ROXICODONE) 5 MG immediate release tablet Take 1 tablet (5 mg total) by mouth every 6 (six) hours as needed. 30 tablet 0  . pantoprazole (PROTONIX) 40 MG tablet TAKE 1 TABLET BY MOUTH ONCE A DAY. (Patient taking differently: Take 40 mg by mouth daily. ) 30 tablet 4  . potassium chloride SA (K-DUR,KLOR-CON) 20 MEQ tablet TAKE 1&1/2 TABLET BY MOUTH DAILY. (Patient taking differently: Take 30 mEq by mouth daily. ) 45 tablet 6  . rosuvastatin (CRESTOR) 10 MG tablet TAKE 1 TABLET BY MOUTH ONCE A DAY. (Patient taking differently: Take 10 mg by mouth every evening. ) 30 tablet 6  . solifenacin (VESICARE) 5 MG tablet Take 1 tablet (5 mg total) by mouth daily. 30 tablet 5  . tiotropium (SPIRIVA HANDIHALER) 18 MCG inhalation capsule Place 18 mcg into inhaler and inhale daily. 2 puffs    . TRADJENTA 5 MG TABS tablet TAKE 1 TABLET BY MOUTH ONCE DAILY. 30 tablet 0  . traMADol (ULTRAM) 50 MG tablet TAKE (1) TABLET BY MOUTH AT BEDTIME FOR PAIN. (Patient taking differently: Take 50 mg by mouth every 8 (eight) hours as needed for moderate pain or severe pain. ) 30 tablet 0  . traZODone (DESYREL) 50 MG tablet Take 50 mg by mouth at bedtime.    . Vitamin D,  Ergocalciferol, (DRISDOL) 1.25 MG (50000 UT) CAPS capsule TAKE 1 CAPSULE BY MOUTH ONCE A WEEK. 12 capsule 0    No results found for this or any previous visit (from the past 48 hour(s)). No results found.  ROS  Last menstrual period 05/19/2016. Physical Exam open wound right ring finger and third webspace.  We will plan for irrigation debridement and repair is necessary.The patient is alert and oriented in no acute distress. The patient complains of pain in the affected upper extremity.  The patient is noted to have a normal HEENT exam. Lung fields show equal chest expansion and no shortness of breath. Abdomen exam is nontender without distention. Lower extremity examination does not show any fracture dislocation or blood clot symptoms. Pelvis is stable and the neck and back are stable and nontender.  Assessment/Plan We are planning surgery for your upper extremity. The risk and benefits of surgery to include risk of bleeding, infection, anesthesia,  damage to normal structures and failure of the surgery to accomplish its intended goals of relieving symptoms and restoring function have been discussed in detail. With this in mind we plan to proceed. I have specifically discussed with the patient the pre-and postoperative regime and the dos and don'ts and risk and benefits in great detail. Risk and benefits of surgery also include risk of dystrophy(CRPS), chronic nerve pain, failure of the healing process to go onto completion and other inherent risks of surgery The relavent the pathophysiology of the disease/injury process, as well as the alternatives for treatment and postoperative course of action has been discussed in great detail with the patient who desires to proceed.  We will do everything in our power to help you (the patient) restore function to the upper extremity. It is a pleasure to see this patient today. We will plan for irrigation debridement skin grafting right ring finger and  webspace with donor right forearm  Willa Frater III, MD 11/22/2018, 5:15 PM

## 2018-11-22 NOTE — Anesthesia Preprocedure Evaluation (Signed)
Anesthesia Evaluation  Patient identified by MRN, date of birth, ID band Patient awake    Reviewed: Allergy & Precautions, H&P , NPO status , Patient's Chart, lab work & pertinent test results  Airway Mallampati: II   Neck ROM: full    Dental   Pulmonary sleep apnea , COPD, former smoker,    breath sounds clear to auscultation       Cardiovascular hypertension, + Peripheral Vascular Disease and +CHF  + dysrhythmias Atrial Fibrillation  Rhythm:regular Rate:Normal     Neuro/Psych PSYCHIATRIC DISORDERS Anxiety Depression  Neuromuscular disease CVA    GI/Hepatic   Endo/Other  diabetes, Type 2obese  Renal/GU      Musculoskeletal  (+) Arthritis ,   Abdominal   Peds  Hematology   Anesthesia Other Findings   Reproductive/Obstetrics                             Anesthesia Physical Anesthesia Plan  ASA: III  Anesthesia Plan: General   Post-op Pain Management:    Induction: Intravenous  PONV Risk Score and Plan: 3 and Ondansetron and Dexamethasone  Airway Management Planned: Oral ETT  Additional Equipment:   Intra-op Plan:   Post-operative Plan: Extubation in OR  Informed Consent: I have reviewed the patients History and Physical, chart, labs and discussed the procedure including the risks, benefits and alternatives for the proposed anesthesia with the patient or authorized representative who has indicated his/her understanding and acceptance.       Plan Discussed with: CRNA, Anesthesiologist and Surgeon  Anesthesia Plan Comments:         Anesthesia Quick Evaluation

## 2018-11-23 ENCOUNTER — Encounter (HOSPITAL_COMMUNITY): Payer: Self-pay | Admitting: Orthopedic Surgery

## 2018-11-23 ENCOUNTER — Other Ambulatory Visit: Payer: Self-pay

## 2018-11-23 DIAGNOSIS — Z87828 Personal history of other (healed) physical injury and trauma: Secondary | ICD-10-CM

## 2018-11-23 LAB — GLUCOSE, CAPILLARY
Glucose-Capillary: 305 mg/dL — ABNORMAL HIGH (ref 70–99)
Glucose-Capillary: 239 mg/dL — ABNORMAL HIGH (ref 70–99)
Glucose-Capillary: 144 mg/dL — ABNORMAL HIGH (ref 70–99)
Glucose-Capillary: 199 mg/dL — ABNORMAL HIGH (ref 70–99)

## 2018-11-23 LAB — BASIC METABOLIC PANEL
Anion gap: 13 (ref 5–15)
BUN: 21 mg/dL — ABNORMAL HIGH (ref 6–20)
CO2: 23 mmol/L (ref 22–32)
Calcium: 9 mg/dL (ref 8.9–10.3)
Chloride: 104 mmol/L (ref 98–111)
Creatinine, Ser: 1.34 mg/dL — ABNORMAL HIGH (ref 0.44–1.00)
GFR calc Af Amer: 52 mL/min — ABNORMAL LOW (ref >60)
GFR calc non Af Amer: 45 mL/min — ABNORMAL LOW (ref >60)
Glucose, Bld: 243 mg/dL — ABNORMAL HIGH (ref 70–99)
Potassium: 3.8 mmol/L (ref 3.5–5.1)
Sodium: 140 mmol/L (ref 135–145)

## 2018-11-23 LAB — CBC
HCT: 35.8 % — ABNORMAL LOW (ref 36.0–46.0)
Hemoglobin: 10.8 g/dL — ABNORMAL LOW (ref 12.0–15.0)
MCH: 27.2 pg (ref 26.0–34.0)
MCHC: 30.2 g/dL (ref 30.0–36.0)
MCV: 90.2 fL (ref 80.0–100.0)
Platelets: 223 10*3/uL (ref 150–400)
RBC: 3.97 MIL/uL (ref 3.87–5.11)
RDW: 15.9 % — ABNORMAL HIGH (ref 11.5–15.5)
WBC: 6.9 10*3/uL (ref 4.0–10.5)
nRBC: 0 % (ref 0.0–0.2)

## 2018-11-23 LAB — HEMOGLOBIN A1C
Hgb A1c MFr Bld: 7 % — ABNORMAL HIGH (ref 4.8–5.6)
Mean Plasma Glucose: 154.2 mg/dL

## 2018-11-23 MED ORDER — APIXABAN 5 MG PO TABS
5.0000 mg | ORAL_TABLET | Freq: Two times a day (BID) | ORAL | Status: DC
  Administered 2018-11-23 – 2018-11-26 (×6): 5 mg via ORAL
  Filled 2018-11-23 (×6): qty 1

## 2018-11-23 MED ORDER — INSULIN ASPART 100 UNIT/ML ~~LOC~~ SOLN: 0.0000 [IU] | Freq: Every day | SUBCUTANEOUS | Status: DC

## 2018-11-23 MED ORDER — INSULIN GLARGINE 100 UNIT/ML ~~LOC~~ SOLN
5.0000 [IU] | Freq: Every day | SUBCUTANEOUS | Status: DC
  Administered 2018-11-23 – 2018-11-26 (×4): 5 [IU] via SUBCUTANEOUS
  Filled 2018-11-23 (×4): qty 0.05

## 2018-11-23 MED ORDER — INSULIN ASPART 100 UNIT/ML ~~LOC~~ SOLN
0.0000 [IU] | Freq: Three times a day (TID) | SUBCUTANEOUS | Status: DC
  Administered 2018-11-23 – 2018-11-24 (×2): 1 [IU] via SUBCUTANEOUS
  Administered 2018-11-24: 2 [IU] via SUBCUTANEOUS
  Administered 2018-11-24 – 2018-11-25 (×3): 1 [IU] via SUBCUTANEOUS
  Administered 2018-11-25: 2 [IU] via SUBCUTANEOUS
  Administered 2018-11-26: 1 [IU] via SUBCUTANEOUS
  Administered 2018-11-26: 2 [IU] via SUBCUTANEOUS
  Administered 2018-11-26: 1 [IU] via SUBCUTANEOUS

## 2018-11-23 MED ORDER — FUROSEMIDE 40 MG PO TABS
40.0000 mg | ORAL_TABLET | Freq: Every day | ORAL | Status: DC
  Administered 2018-11-23 – 2018-11-26 (×4): 40 mg via ORAL
  Filled 2018-11-23 (×4): qty 1

## 2018-11-23 NOTE — Consult Note (Signed)
   [facility] Inpatient Consult   11/23/2018  [name] Jun 27, 1966 [id number]   Patient screened for unplanned readmission score of 45%, extreme. Patient is currently active with [facility] social worker for transportation needs. Patient had been active with [facility] and, per chart review,community RN follow up was discontinued at patient's request.  Martin Majestic to bedside to speak with [name] to review [facility] CM services and assess continued community needs. Patient asleep at this time. Will continue to follow for progression and disposition plans.  Of note, [facility] Care Management services does not replace or interfere with any services that are needed or arranged by inpatient case management or social work.    Netta Cedars, MSN, [facility] Liaison Nurse Mobile Phone [phone]  Toll free office [phone]

## 2018-11-23 NOTE — Op Note (Signed)
NAME: EDIT, RICCIARDELLI MEDICAL RECORD EY:[id number] ACCOUNT [id number] DATE OF BIRTH:07-24-1966 FACILITY: [facility] LOCATION: [facility] PHYSICIAN:Syerra Abdelrahman M. Carnesha Maravilla, MD  OPERATIVE REPORT  DATE OF PROCEDURE:  11/22/2018  PREOPERATIVE DIAGNOSIS:  Right hand third webspace and ring finger soft tissue defect with open proximal interphalangeal joint and stable/improving her parameters and status post assisted closure device placement and aggressive treatment of infection in  the past.  POSTOPERATIVE DIAGNOSIS:  Right hand third webspace and ring finger soft tissue defect with open proximal interphalangeal joint and stable/improving her parameters and status post assisted closure device placement and aggressive treatment of infection in  the past.  PROCEDURE: 1.  Irrigation and debridement of skin, subcutaneous tissue, hand and right ring finger. 2.  Preparation of skin bed for skin grafting. 3.  Open arthrotomy and synovectomy proximal interphalangeal  joint, right ring finger. 4.  Pinning right ring finger proximal interphalangeal  joint. 5.  A cm x 2 cm full-thickness skin graft, right ring finger and web space.  SURGEON:  [name], MD  ASSISTANT:  None.  COMPLICATIONS:  None.  ANESTHESIA:  General.  TOURNIQUET TIME:  Zero.  INDICATIONS:  A 53 year old female who desires operative procedure for finger.  She has had multiple washouts and aggressive treatment for infection.  The infection has resolved and she has a large defect.  We have planned pinning a vacuum-assisted  closure device on this to try and help her get to a stable state.  She appears to have a stable state of affairs that would be amenable to salvage and pinning as well as grafting and she desires.  This is a post-amputation which I offered her as well.  OPERATIVE PROCEDURE:  The patient was seen by myself and anesthesia and taken to the operative theater and underwent smooth induction of general anesthetic, prepped and  draped in usual sterile fashion with Hibiclens scrub followed by Betadine scrub and  paint.  She was prepped and draped in the usual sterile fashion with Betadine scrub and paint as mention and following this, timeout was called.  Irrigation and debridement of skin, subcutaneous tissue was accomplished.  The joint underwent an arthrotomy  and synovectomy.  This was a right ring finger PIP arthrotomy and synovectomy and debridement.  I then reduced the joint and pinned it with a 0.045 K-wire.  Stress radiography was accomplished, AP, lateral and oblique x-rays were performed examined and  interpreted by myself.  Following skin and bed preparation for skin grafting as well as the PIP arthrotomy and synovectomy and pinning, the patient had a full-thickness skin graft,  3 cm x 2 cm harvested from the forearm and closed primarily.  All parameters looked good and by  reducing the joint, we had full coverage and good bed for vascular intake of the graft.  At this time, I placed the graft in a sterile environment, defatted the area, peppered the central portion to allow for the aggression of any fluid and then sutured  in with a cotton bolster dressing and a large tight tension to a mild to moderate degree, so as to promote vascular ingrowth to the full thickness skin graft.  The patient looked well.  There was good refill.  No complications.  She was dressed with  cotton bolster dressing.  I initially placed Mepitel, Xeroform and a then the cotton ball and oversewed the sutures over so that they were tied down.    The patient tolerated this well.  There were no complicating features.  She  was placed in a sterile dressing and volar plaster splint.  There were no complications.  Following this, she was taken to recovery room in stable condition.    DISPOSITION:  She will be admitted for IV antibiotics, general postop observation and elevation should any problems occur, she will notify me.  I would recommend  home health and hospitalist consult so that we can try and give her the best opportunity to recover.  I would give her a guarded prognosis.  If this fails, I would certainly once again revisited amputation and she understands this.  All  questions have been encouraged and answered.  AN/NUANCE  D:11/22/2018 T:11/23/2018 JOB:[id number]

## 2018-11-23 NOTE — Progress Notes (Signed)
Inpatient Diabetes Program Recommendations  AACE/ADA: New Consensus Statement on Inpatient Glycemic Control (2015)  Target Ranges:  Prepandial:   less than 140 mg/dL      Peak postprandial:   less than 180 mg/dL (1-2 hours)      Critically ill patients:  140 - 180 mg/dL   Lab Results  Component Value Date   GLUCAP 305 (H) 11/23/2018   HGBA1C 5.3 07/26/2018    Review of Glycemic Control Results for [name], [name] (MRN [id number]) as of 11/23/2018 09:32  Ref. Range 11/22/2018 23:35 11/23/2018 06:27  Glucose-Capillary Latest Ref Range: 70 - 99 mg/dL 137 (H) 305 (H)   Diabetes history: DM 2 Outpatient Diabetes medications: Tradjenta 5 mg daily Current orders for Inpatient glycemic control:  Novolog sensitive tid with meals   Inpatient Diabetes Program Recommendations:    Blood sugars increased this AM, possibly due to Decadron 5 mg x1 patient received on 11/22/18.   Please add A1C to current labs to assess glycemic control for past 2-3 months.  Thanks,  Adah Perl, RN, BC-ADM Inpatient Diabetes Coordinator Pager [phone] (8a-5p)

## 2018-11-23 NOTE — Evaluation (Signed)
Physical Therapy Evaluation Patient Details Name: [name] MRN: [id number] DOB: [birth year] Today's Date: 11/23/2018   History of Present Illness  Patient admitted for I & D and right ring finger pinning, wound due to dog bite 6 weeks ago. PMH to include: ETOH, anxiety, COPD, DM, HTN, HLD, CHF, afib.    Clinical Impression  Patient received in bed, agreeable to PT evaluation. Reports significant pain in right hand, requests pain med, RN notified. Patient demonstrates modified independence with bed mobility and transfers. Using rails on bed. Patient able to ambulate 50 feet in room with rw and min guard assist. Patient will benefit from skilled PT while here at [facility] to improve functional independence, improve strength for return home at discharge.        Follow Up Recommendations Home health PT    Equipment Recommendations  None recommended by PT    Recommendations for Other Services OT consult     Precautions / Restrictions Precautions Precautions: Fall Required Braces or Orthoses: Splint/Cast Splint/Cast: R hand Restrictions Weight Bearing Restrictions: No      Mobility  Bed Mobility Overal bed mobility: Modified Independent             General bed mobility comments: use of bedrails   Transfers Overall transfer level: Modified independent Equipment used: Rolling walker (2 wheeled)                Ambulation/Gait Ambulation/Gait assistance: Modified independent (Device/Increase time) Gait Distance (Feet): 50 Feet Assistive device: Rolling walker (2 wheeled) Gait Pattern/deviations: Step-through pattern Gait velocity: WNL      Stairs            Wheelchair Mobility    Modified Rankin (Stroke Patients Only)       Balance Overall balance assessment: Modified Independent                                           Pertinent Vitals/Pain Pain Assessment: 0-10 Pain Score: 8  Pain Descriptors / Indicators:  Aching;Constant;Operative site guarding Pain Intervention(s): Monitored during session;Patient requesting pain meds-RN notified    Home Living Family/patient expects to be discharged to:: Private residence Living Arrangements: Alone Available Help at Discharge: Available PRN/intermittently Type of Home: House Home Access: [place]: One [place]: Bedside commode;Cane - single point;Walker - 2 wheels;Wheelchair - manual      Prior Function Level of Independence: Independent with assistive device(s)         Comments: household & short distanced community ambulator with RW or wheelchair     Hand Dominance   Dominant Hand: Right    Extremity/Trunk Assessment   Upper Extremity Assessment Upper Extremity Assessment: Overall WFL for tasks assessed;RUE deficits/detail RUE Deficits / Details: splint on right hand- patient not to use/move fingers RUE Sensation: decreased light touch RUE Coordination: decreased fine motor    Lower Extremity Assessment Lower Extremity Assessment: Overall WFL for tasks assessed       Communication   Communication: No difficulties  Cognition Arousal/Alertness: Awake/alert Behavior During Therapy: WFL for tasks assessed/performed Overall Cognitive Status: Within Functional Limits for tasks assessed                                        General Comments  Exercises     Assessment/Plan    PT Assessment Patient needs continued PT services  PT Problem List Decreased strength;Decreased mobility;Pain       PT Treatment Interventions Functional mobility training;Patient/family education;Gait training;Therapeutic activities;Neuromuscular re-education;Therapeutic exercise    PT Goals (Current goals can be found in the Care Plan section)  Acute Rehab PT Goals Patient Stated Goal: to return home with [facility] PT Goal Formulation: With patient Time For Goal Achievement: 11/30/18 Potential to  Achieve Goals: Good    Frequency Min 3X/week   Barriers to discharge Decreased caregiver support      Co-evaluation               AM-PAC PT "6 Clicks" Mobility  Outcome Measure Help needed turning from your back to your side while in a flat bed without using bedrails?: A Little Help needed moving from lying on your back to sitting on the side of a flat bed without using bedrails?: A Little Help needed moving to and from a bed to a chair (including a wheelchair)?: A Little Help needed standing up from a chair using your arms (e.g., wheelchair or bedside chair)?: A Little Help needed to walk in hospital room?: A Little Help needed climbing 3-5 steps with a railing? : A Lot 6 Click Score: 17    End of Session Equipment Utilized During Treatment: Gait belt Activity Tolerance: Patient tolerated treatment well Patient left: in bed;with call bell/phone within reach Nurse Communication: Mobility status;Patient requests pain meds PT Visit Diagnosis: Other abnormalities of gait and mobility (R26.89)    Time: 7741-4239 PT Time Calculation (min) (ACUTE ONLY): 12 min   Charges:   PT Evaluation $PT Eval Low Complexity: 1 Low PT Treatments $Gait Training: 8-22 mins        Sherwood Castilla, PT, GCS 11/23/18,2:57 PM

## 2018-11-23 NOTE — Progress Notes (Signed)
Called and talked to Dr. Marcie Bal about Mrs. Freitas BP=181/103 after receiving 20mg  of labetalol. No new orders where giving at this time.

## 2018-11-23 NOTE — Progress Notes (Addendum)
Consult NOTE    [name]  ZOX:[id number] DOB: 1966/04/24 DOA: 11/22/2018 PCP: [name], MD   Brief Narrative: [name] is an 53 y.o. female with medical history significant for chronic combined systolic and diastolic CHF (EF 81-19%, J4NW, diffuse hypokinesis by TTE 05/2017), Atrial Fibrillation, COPD, T2DM, HTN, HLD, CKD Stage III who is admitted for repeat irrigation and debridement and pinning right hand wound with surgical skin grafting performed by orthopedics, Dr. Amedeo Plenty.   Initial wound occurred by dog bite approximately 6 weeks ago and underwent I&D on 10/11/2018 and 10/13/2018 and has been on wound VAC as an outpatient prior to this procedure.  Patient seen by me in the PACU postop.  She currently denies any fevers, chills, diaphoresis, chest pain, palpitations, diaphoresis, abdominal pain, diarrhea or constipation, dysuria, or peripheral edema.  She states that she has been taking her outpatient medications regularly episode for today prior to her procedure.  Assessment & Plan:   Active Problems:   H/O open hand wound  [name] is an 53 y.o. female with medical history significant for chronic combined systolic and diastolic CHF (EF 29-56%, O1HY, diffuse hypokinesis by TTE 05/2017), Atrial Fibrillation, COPD, T2DM, HTN, HLD, CKD Stage III who is admitted for repeat irrigation and debridement and pinning right hand wound with surgical skin grafting performed by orthopedics, Dr. Amedeo Plenty.  Type 2 diabetes:  On Tradjenta as an outpatient.  Last A1c 7 11/23/2018. Decadron 5 mg x 1, 1/28 Start lantus 5 units, continue SSI ACHS Suspect some hyperglycemia in the setting of steroids  Chronic combined systolic and diastolic CHF:  EF 86-57%, G2DD, diffuse hypokinesis by TTE 05/2017.  She is euvolemic on exam with no signs of acute CHF exacerbation. -Continue Toprol-XL - resume lasix -Monitor I/O's and daily weights  Hypertension: Remains  hypertensive -Continue home amlodipine, Toprol-XL.  Lasix has been resumed as noted above. prn hydral ordered.  Paroxysmal atrial fibrillation: Chadsvasc score at least 6.   Home apixaban on hold, will discuss resumption with Dr. Amedeo Plenty today.  She's on DAPT as well, will need to follow up with her primary prescriber to see whether this should be continued long term. Rate controlled on exam, continue Toprol-XL. Addendum: Regarding her anticoagulation with apixiban, on discussion with pharmacy, she is not receiving this in her pill pack as an outpatient.  Pt unable to say what "blood thinners" she's taking.  On review of previous notes, it looks like she was seen by Dr. Bronson Ing on 06/08/2018 who d/c'd plavix and started apixiban and continue aspirin.  It seems like she should be on aspirin and eliquis only.  Will resume eliquis today.  Will hold plavix for now.  Will try to reach one of her primary docs for additional clarification.  She'll need eliquis prescription at discharge.    Open right hand wound status post I&D and surgical skin grafting by Dr. Amedeo Plenty 11/22/18: Currently on Augmentin, continue postop care per orthopedics.  CKD stage III: Will obtain basic metabolic panel in [name].m.  Hx of CVA: Continue aspirin and Plavix.  She's on DAPT and eliquis, will need to discuss long term plan for this with her PCP.  Depression/GAD: Continue Cymbalta.  COPD: Currently stable.  Continue Spiriva, Singulair, and as needed nebulizers.  HLD: Continue Crestor and Zetia.   DVT prophylaxis: SCD Code Status: full  Family Communication: daugther at bedside Disposition Plan: per surgery   Consultants:   Orthopedics is primary  Procedures:   I&D of  skin, subcutaneous tissue, hand, and right ring finger  Preparation of skin bed for skin grafting  Open arthrotomy and synovectomy proximal interphalangeal joint, R ring finger  Pinning R ring finger proximal interphalangeal joint  Full  thickness skin graft, right ring finger and web space  1/28  Antimicrobials:  Anti-infectives (From admission, onward)   Start     Dose/Rate Route Frequency Ordered Stop   11/23/18 1000  amoxicillin-clavulanate (AUGMENTIN) 875-125 MG per tablet 1 tablet    Note to Pharmacy:  Started 14 day course on 11-16-17     1 tablet Oral 2 times daily 11/22/18 2305     11/23/18 1000  ceFAZolin (ANCEF) IVPB 1 g/50 mL premix     1 g 100 mL/hr over 30 Minutes Intravenous Every 8 hours 11/22/18 2305     11/23/18 0200  ceFAZolin (ANCEF) IVPB 1 g/50 mL premix     1 g 100 mL/hr over 30 Minutes Intravenous NOW 11/22/18 2305 11/23/18 0216   11/22/18 1845  ceFAZolin (ANCEF) IVPB 2g/100 mL premix     2 g 200 mL/hr over 30 Minutes Intravenous  Once 11/22/18 1834 11/22/18 2030         Subjective: Pain is ok  Objective: Vitals:   11/22/18 2326 11/23/18 0104 11/23/18 0524 11/23/18 1337  BP: (!) 172/117 (!) 152/100 (!) 177/94 (!) 164/99  Pulse: 79 76 79 73  Resp: 16  16 17   Temp: 97.7 F (36.5 C)  97.6 F (36.4 C) 99.2 F (37.3 C)  TempSrc: Oral  Oral Oral  SpO2: 99%  98% 99%  Weight:      Height:        Intake/Output Summary (Last 24 hours) at 11/23/2018 1449 Last data filed at 11/23/2018 1337 Gross per 24 hour  Intake 2060 ml  Output 1220 ml  Net 840 ml   Filed Weights   11/22/18 1806  Weight: 78 kg    Examination:  General exam: Appears calm and comfortable  Respiratory system: Clear to auscultation. Respiratory effort normal. Cardiovascular system: S1 & S2 heard, RRR.  Gastrointestinal system: Abdomen is nondistended, soft and nontender. Central nervous system: Alert and oriented. No focal neurological deficits. Extremities: R hand in dressing Skin: No rashes, lesions or ulcers Psychiatry: Judgement and insight appear normal. Mood & affect appropriate.     Data Reviewed: I have personally reviewed following labs and imaging studies  CBC: Recent Labs  Lab 11/22/18 1900  11/23/18 0303  WBC  --  6.9  HGB 12.9 10.8*  HCT 38.0 35.8*  MCV  --  90.2  PLT  --  409   Basic Metabolic Panel: Recent Labs  Lab 11/22/18 1900 11/23/18 0303  NA 143 140  K 3.6 3.8  CL  --  104  CO2  --  23  GLUCOSE 104* 243*  BUN  --  21*  CREATININE  --  1.34*  CALCIUM  --  9.0   GFR: Estimated Creatinine Clearance: 45.4 mL/min ([name]) (by C-G formula based on SCr of 1.34 mg/dL (H)). Liver Function Tests: No results for input(s): AST, ALT, ALKPHOS, BILITOT, PROT, ALBUMIN in the last 168 hours. No results for input(s): LIPASE, AMYLASE in the last 168 hours. No results for input(s): AMMONIA in the last 168 hours. Coagulation Profile: No results for input(s): INR, PROTIME in the last 168 hours. Cardiac Enzymes: No results for input(s): CKTOTAL, CKMB, CKMBINDEX, TROPONINI in the last 168 hours. BNP (last 3 results) No results for input(s): PROBNP in the last  8760 hours. HbA1C: Recent Labs    11/23/18 0303  HGBA1C 7.0*   CBG: Recent Labs  Lab 11/22/18 1850 11/22/18 2142 11/22/18 2335 11/23/18 0627 11/23/18 1107  GLUCAP 100* 100* 137* 305* 239*   Lipid Profile: No results for input(s): CHOL, HDL, LDLCALC, TRIG, CHOLHDL, LDLDIRECT in the last 72 hours. Thyroid Function Tests: No results for input(s): TSH, T4TOTAL, FREET4, T3FREE, THYROIDAB in the last 72 hours. Anemia Panel: No results for input(s): VITAMINB12, FOLATE, FERRITIN, TIBC, IRON, RETICCTPCT in the last 72 hours. Sepsis Labs: No results for input(s): PROCALCITON, LATICACIDVEN in the last 168 hours.  No results found for this or any previous visit (from the past 240 hour(s)).       Radiology Studies: No results found.      Scheduled Meds: . amiodarone  200 mg Oral BID  . amLODipine  10 mg Oral Daily  . amoxicillin-clavulanate  1 tablet Oral BID  . aspirin EC  81 mg Oral Daily  . clopidogrel  75 mg Oral Daily  . darifenacin  7.5 mg Oral Daily  . docusate sodium  100 mg Oral BID  .  DULoxetine  60 mg Oral Daily  . ezetimibe  10 mg Oral Daily  . insulin aspart  0-9 Units Subcutaneous TID WC  . metoprolol succinate  50 mg Oral BID  . montelukast  10 mg Oral QHS  . multivitamin with minerals  1 tablet Oral Daily  . pantoprazole  40 mg Oral Daily  . potassium chloride SA  30 mEq Oral Daily  . rosuvastatin  10 mg Oral Daily  . traZODone  50 mg Oral QHS  . umeclidinium bromide  1 puff Inhalation Daily  . vitamin C  1,000 mg Oral Daily   Continuous Infusions: .  ceFAZolin (ANCEF) IV 1 g (11/23/18 0903)  . lactated ringers Stopped (11/23/18 1140)     LOS: 1 day    Time spent: over 30 min    [name], MD [facility] Pager AMION  If 7PM-7AM, please contact night-coverage [email or web address] Password [facility] 11/23/2018, 2:49 PM

## 2018-11-23 NOTE — Progress Notes (Signed)
Patient ID: [name], female   DOB: [birth year], 53 y.o.   MRN: [id number] Patient is stable postop.  Patient has no signs of infection or vascular compromise.  I went over all issues with her at length.  We will plan for continued elevation.  We will request consult for home health and aid in her discharge planning.  Overall she is in good spirits.  She is tolerating a regular diet.  We will continue the antibiotics elevation and other measures.  The patient is alert and oriented in no acute distress. The patient complains of pain in the affected upper extremity.  The patient is noted to have a normal HEENT exam. Lung fields show equal chest expansion and no shortness of breath. Abdomen exam is nontender without distention. Lower extremity examination does not show any fracture dislocation or blood clot symptoms. Pelvis is stable and the neck and back are stable and nontender.  Status post irrigation debridement and skin grafting with pinning of the right hand and ring finger  Shian Goodnow MD

## 2018-11-24 LAB — CBC
HCT: 35.6 % — ABNORMAL LOW (ref 36.0–46.0)
Hemoglobin: 10.8 g/dL — ABNORMAL LOW (ref 12.0–15.0)
MCH: 27.3 pg (ref 26.0–34.0)
MCHC: 30.3 g/dL (ref 30.0–36.0)
MCV: 90.1 fL (ref 80.0–100.0)
Platelets: 227 10*3/uL (ref 150–400)
RBC: 3.95 MIL/uL (ref 3.87–5.11)
RDW: 15.9 % — ABNORMAL HIGH (ref 11.5–15.5)
WBC: 8 10*3/uL (ref 4.0–10.5)
nRBC: 0 % (ref 0.0–0.2)

## 2018-11-24 LAB — BASIC METABOLIC PANEL
Anion gap: 8 (ref 5–15)
BUN: 24 mg/dL — ABNORMAL HIGH (ref 6–20)
CO2: 25 mmol/L (ref 22–32)
Calcium: 8.7 mg/dL — ABNORMAL LOW (ref 8.9–10.3)
Chloride: 107 mmol/L (ref 98–111)
Creatinine, Ser: 1.48 mg/dL — ABNORMAL HIGH (ref 0.44–1.00)
GFR calc Af Amer: 46 mL/min — ABNORMAL LOW (ref >60)
GFR calc non Af Amer: 40 mL/min — ABNORMAL LOW (ref >60)
Glucose, Bld: 148 mg/dL — ABNORMAL HIGH (ref 70–99)
Potassium: 3.9 mmol/L (ref 3.5–5.1)
Sodium: 140 mmol/L (ref 135–145)

## 2018-11-24 LAB — GLUCOSE, CAPILLARY
Glucose-Capillary: 137 mg/dL — ABNORMAL HIGH (ref 70–99)
Glucose-Capillary: 142 mg/dL — ABNORMAL HIGH (ref 70–99)
Glucose-Capillary: 141 mg/dL — ABNORMAL HIGH (ref 70–99)
Glucose-Capillary: 158 mg/dL — ABNORMAL HIGH (ref 70–99)
Glucose-Capillary: 126 mg/dL — ABNORMAL HIGH (ref 70–99)

## 2018-11-24 LAB — MAGNESIUM: Magnesium: 1.8 mg/dL (ref 1.7–2.4)

## 2018-11-24 NOTE — Progress Notes (Signed)
Consult NOTE    [name]  HGD:[id number] DOB: 1966-07-25 DOA: 11/22/2018 PCP: Barbara Helper, MD   Brief Narrative: [name] is an 53 y.o. female with medical history significant for chronic combined systolic and diastolic CHF (EF 96-22%, W9NL, diffuse hypokinesis by TTE 05/2017), Atrial Fibrillation, COPD, T2DM, HTN, HLD, CKD Stage III who is admitted for repeat irrigation and debridement and pinning right hand wound with surgical skin grafting performed by orthopedics, Dr. Amedeo [name].   Initial wound occurred by dog bite approximately 6 weeks ago and underwent I&D on 10/11/2018 and 10/13/2018 and has been on wound VAC as an outpatient prior to this procedure.  Patient seen by me in the PACU postop.  She currently denies any fevers, chills, diaphoresis, chest pain, palpitations, diaphoresis, abdominal pain, diarrhea or constipation, dysuria, or peripheral edema.  She states that she has been taking her outpatient medications regularly episode for today prior to her procedure.  Assessment & Plan:   Active Problems:   H/O open hand wound  [name] is an 53 y.o. female with medical history significant for chronic combined systolic and diastolic CHF (EF 89-21%, J9ER, diffuse hypokinesis by TTE 05/2017), Atrial Fibrillation, COPD, T2DM, HTN, HLD, CKD Stage III who is admitted for repeat irrigation and debridement and pinning right hand wound with surgical skin grafting performed by orthopedics, Dr. Amedeo [name].  Type 2 diabetes:  On Tradjenta as an outpatient.  Last A1c 7 11/23/2018. Decadron 5 mg x 1, 1/28 lantus 5 units, continue SSI ACHS Suspect some hyperglycemia in the setting of steroids BG's improved today  Chronic combined systolic and diastolic CHF:  EF 74-08%, G2DD, diffuse hypokinesis by TTE 05/2017.  She is euvolemic on exam with no signs of acute CHF exacerbation. -Continue Toprol-XL -resume lasix -Monitor I/O's and daily weights  Hypertension: Remains  hypertensive -Continue home amlodipine, Toprol-XL.  Lasix has been resumed as noted above. prn hydral ordered.  Paroxysmal atrial fibrillation: Chadsvasc score at least 6.   Eliquis has been resumed Not clear why she's on DAPT as well, but as noted yesterday, in previous hospitalization, plavix was discontinued.  I checked with [name] who hasn't seen her since that hospitalization.  Not clear why she's currently on DAPT, denies recent stent or hospitalization anywhere other than [facility].  Will d/c plavix, continue aspirin.  Needs close outpatient follow up. Rate controled, continue metop  Open right hand wound status post I&D and surgical skin grafting by Dr. Amedeo [name] 11/22/18: Currently on Augmentin, continue postop care per orthopedics.  CKD stage III: Will obtain basic metabolic panel in [name].  Hx of CVA: Continue aspirin and d/c Plavix as noted above.   Depression/GAD: Continue Cymbalta.  COPD: Currently stable.  Continue Spiriva, Singulair, and as needed nebulizers.  HLD: Continue Crestor and Zetia.   DVT prophylaxis: SCD Code Status: full  Family Communication: none at bedside Disposition Plan: per surgery   Consultants:   Orthopedics is primary  Procedures:   I&D of skin, subcutaneous tissue, hand, and right ring finger  Preparation of skin bed for skin grafting  Open arthrotomy and synovectomy proximal interphalangeal joint, R ring finger  Pinning R ring finger proximal interphalangeal joint  Full thickness skin graft, right ring finger and web space  1/28  Antimicrobials:  Anti-infectives (From admission, onward)   Start     Dose/Rate Route Frequency Ordered Stop   11/23/18 1000  amoxicillin-clavulanate (AUGMENTIN) 875-125 MG per tablet 1 tablet    Note to Pharmacy:  Started 14 day course on 11-16-17     1 tablet Oral 2 times daily 11/22/18 2305     11/23/18 1000  ceFAZolin (ANCEF) IVPB 1 g/50 mL premix  Status:  Discontinued     1  g 100 mL/hr over 30 Minutes Intravenous Every 8 hours 11/22/18 2305 11/24/18 1005   11/23/18 0200  ceFAZolin (ANCEF) IVPB 1 g/50 mL premix     1 g 100 mL/hr over 30 Minutes Intravenous NOW 11/22/18 2305 11/23/18 0216   11/22/18 1845  ceFAZolin (ANCEF) IVPB 2g/100 mL premix     2 g 200 mL/hr over 30 Minutes Intravenous  Once 11/22/18 1834 11/22/18 2030         Subjective: Doing ok. No complaints.  Objective: Vitals:   11/23/18 2152 11/24/18 0438 11/24/18 0830 11/24/18 1033  BP: (!) 153/103 (!) 158/88 (!) 138/94 (!) 136/100  Pulse: 71 63 65 64  Resp:  20 20   Temp:  98.2 F (36.8 C) 98.6 F (37 C) (!) 97.5 F (36.4 C)  TempSrc:  Oral Oral Oral  SpO2:   100% 98%  Weight:      Height:        Intake/Output Summary (Last 24 hours) at 11/24/2018 1539 Last data filed at 11/24/2018 1300 Gross per 24 hour  Intake 1397.24 ml  Output 2950 ml  Net -1552.76 ml   Filed Weights   11/22/18 1806  Weight: 78 kg    Examination:  General: No acute distress. Cardiovascular: Heart sounds show [name] regular rate, and rhythm Lungs: Clear to auscultation bilaterally Abdomen: Soft, nontender, nondistended  Neurological: Alert and oriented 3. Moves all extremities 4 . Cranial nerves II through XII grossly intact. Extremities: R arm in cast Psychiatric: Mood and affect are normal. Insight and judgment are appropriate.    Data Reviewed: I have personally reviewed following labs and imaging studies  CBC: Recent Labs  Lab 11/22/18 1900 11/23/18 0303 11/24/18 0241  WBC  --  6.9 8.0  HGB 12.9 10.8* 10.8*  HCT 38.0 35.8* 35.6*  MCV  --  90.2 90.1  PLT  --  223 993   Basic Metabolic Panel: Recent Labs  Lab 11/22/18 1900 11/23/18 0303 11/24/18 0241  NA 143 140 140  K 3.6 3.8 3.9  CL  --  104 107  CO2  --  23 25  GLUCOSE 104* 243* 148*  BUN  --  21* 24*  CREATININE  --  1.34* 1.48*  CALCIUM  --  9.0 8.7*  MG  --   --  1.8   GFR: Estimated Creatinine Clearance: 41.1  mL/min ([name]) (by C-G formula based on SCr of 1.48 mg/dL (H)). Liver Function Tests: No results for input(s): AST, ALT, ALKPHOS, BILITOT, PROT, ALBUMIN in the last 168 hours. No results for input(s): LIPASE, AMYLASE in the last 168 hours. No results for input(s): AMMONIA in the last 168 hours. Coagulation Profile: No results for input(s): INR, PROTIME in the last 168 hours. Cardiac Enzymes: No results for input(s): CKTOTAL, CKMB, CKMBINDEX, TROPONINI in the last 168 hours. BNP (last 3 results) No results for input(s): PROBNP in the last 8760 hours. HbA1C: Recent Labs    11/23/18 0303  HGBA1C 7.0*   CBG: Recent Labs  Lab 11/23/18 1715 11/23/18 2112 11/24/18 0417 11/24/18 0634 11/24/18 1154  GLUCAP 144* 199* 137* 142* 141*   Lipid Profile: No results for input(s): CHOL, HDL, LDLCALC, TRIG, CHOLHDL, LDLDIRECT in the last 72 hours. Thyroid Function Tests: No results for  input(s): TSH, T4TOTAL, FREET4, T3FREE, THYROIDAB in the last 72 hours. Anemia Panel: No results for input(s): VITAMINB12, FOLATE, FERRITIN, TIBC, IRON, RETICCTPCT in the last 72 hours. Sepsis Labs: No results for input(s): PROCALCITON, LATICACIDVEN in the last 168 hours.  No results found for this or any previous visit (from the past 240 hour(s)).       Radiology Studies: No results found.      Scheduled Meds: . amiodarone  200 mg Oral BID  . amLODipine  10 mg Oral Daily  . amoxicillin-clavulanate  1 tablet Oral BID  . apixaban  5 mg Oral BID  . aspirin EC  81 mg Oral Daily  . darifenacin  7.5 mg Oral Daily  . docusate sodium  100 mg Oral BID  . DULoxetine  60 mg Oral Daily  . ezetimibe  10 mg Oral Daily  . furosemide  40 mg Oral Daily  . insulin aspart  0-5 Units Subcutaneous QHS  . insulin aspart  0-9 Units Subcutaneous TID WC  . insulin glargine  5 Units Subcutaneous Daily  . metoprolol succinate  50 mg Oral BID  . montelukast  10 mg Oral QHS  . multivitamin with minerals  1 tablet Oral  Daily  . pantoprazole  40 mg Oral Daily  . potassium chloride SA  30 mEq Oral Daily  . rosuvastatin  10 mg Oral Daily  . traZODone  50 mg Oral QHS  . umeclidinium bromide  1 puff Inhalation Daily  . vitamin C  1,000 mg Oral Daily   Continuous Infusions: . lactated ringers Stopped (11/23/18 0146)     LOS: 2 days    Time spent: over 30 min    Barbara Helper, MD [facility] Pager AMION  If 7PM-7AM, please contact night-coverage [email or web address] Password [facility] 11/24/2018, 3:39 PM

## 2018-11-24 NOTE — Anesthesia Postprocedure Evaluation (Signed)
Anesthesia Post Note  Patient: [name]  Procedure(s) Performed: IRRIGATION AND DEBRIDEMENT AND PINNING RIGHT HAND (Right Hand) SKIN GRAFT SPLIT THICKNESS (Right Hand)     Patient location during evaluation: PACU Anesthesia Type: General Level of consciousness: awake and alert Pain management: pain level controlled Vital Signs Assessment: post-procedure vital signs reviewed and stable Respiratory status: spontaneous breathing, nonlabored ventilation, respiratory function stable and patient connected to nasal cannula oxygen Cardiovascular status: blood pressure returned to baseline and stable Postop Assessment: no apparent nausea or vomiting Anesthetic complications: no    Last Vitals:  Vitals:   11/23/18 2152 11/24/18 0438  BP: (!) 153/103 (!) 158/88  Pulse: 71 63  Resp:  20  Temp:  36.8 C  SpO2:      Last Pain:  Vitals:   11/24/18 0438  TempSrc: Oral  PainSc:                  [place]

## 2018-11-24 NOTE — Progress Notes (Signed)
Patient ID: [name], female   DOB: [birth year], 53 y.o.   MRN: [id number] Stable at present time.  Overall the patient is looking quite well.  I discussed with her relevant issues concerns and other aspects of her care.  I would recommend home health and we will order this today.  I feel she is ready to transition home likely this weekend but would like to have some home health to help her with her activities.  She has ambulatory and upper extremity limitations.  Chest is clear.  Heart regular rate.  Abdomen is nontender and she is tolerating her regular diet.  She has been placed back on her blood thinner.  I appreciate help of medicine in regards to her progress.  Once again I do feel it is a bit unclear how she will do long-term with the hand and finger but are aggressive attempts at salvage have been performed and we will have to see how she heals at this point.  We will plan for pin removal at 3 to 4 weeks based upon her progress or lack thereof and look at the skin graft in 10 to 14 days.  All questions have been addressed.  Darrow Barreiro MD

## 2018-11-25 LAB — CBC
HCT: 35.5 % — ABNORMAL LOW (ref 36.0–46.0)
Hemoglobin: 11 g/dL — ABNORMAL LOW (ref 12.0–15.0)
MCH: 28.3 pg (ref 26.0–34.0)
MCHC: 31 g/dL (ref 30.0–36.0)
MCV: 91.3 fL (ref 80.0–100.0)
Platelets: 221 10*3/uL (ref 150–400)
RBC: 3.89 MIL/uL (ref 3.87–5.11)
RDW: 16 % — ABNORMAL HIGH (ref 11.5–15.5)
WBC: 7.8 10*3/uL (ref 4.0–10.5)
nRBC: 0 % (ref 0.0–0.2)

## 2018-11-25 LAB — GLUCOSE, CAPILLARY
Glucose-Capillary: 150 mg/dL — ABNORMAL HIGH (ref 70–99)
Glucose-Capillary: 141 mg/dL — ABNORMAL HIGH (ref 70–99)
Glucose-Capillary: 167 mg/dL — ABNORMAL HIGH (ref 70–99)
Glucose-Capillary: 192 mg/dL — ABNORMAL HIGH (ref 70–99)

## 2018-11-25 LAB — BASIC METABOLIC PANEL
Anion gap: 9 (ref 5–15)
BUN: 33 mg/dL — ABNORMAL HIGH (ref 6–20)
CO2: 25 mmol/L (ref 22–32)
Calcium: 8.9 mg/dL (ref 8.9–10.3)
Chloride: 106 mmol/L (ref 98–111)
Creatinine, Ser: 1.41 mg/dL — ABNORMAL HIGH (ref 0.44–1.00)
GFR calc Af Amer: 49 mL/min — ABNORMAL LOW (ref >60)
GFR calc non Af Amer: 42 mL/min — ABNORMAL LOW (ref >60)
Glucose, Bld: 180 mg/dL — ABNORMAL HIGH (ref 70–99)
Potassium: 4.1 mmol/L (ref 3.5–5.1)
Sodium: 140 mmol/L (ref 135–145)

## 2018-11-25 LAB — MAGNESIUM: Magnesium: 1.7 mg/dL (ref 1.7–2.4)

## 2018-11-25 NOTE — Progress Notes (Signed)
Consult NOTE    [name]  IPJ:[id number] DOB: 1966/08/23 DOA: 11/22/2018 PCP: [name], MD   Brief Narrative: [name] is an 53 y.o. female with medical history significant for chronic combined systolic and diastolic CHF (EF 73-41%, P3XT, diffuse hypokinesis by TTE 05/2017), Atrial Fibrillation, COPD, T2DM, HTN, HLD, CKD Stage III who is admitted for repeat irrigation and debridement and pinning right hand wound with surgical skin grafting performed by orthopedics, Dr. Amedeo Plenty.   Initial wound occurred by dog bite approximately 6 weeks ago and underwent I&D on 10/11/2018 and 10/13/2018 and has been on wound VAC as an outpatient prior to this procedure.  Patient seen by me in the PACU postop.  She currently denies any fevers, chills, diaphoresis, chest pain, palpitations, diaphoresis, abdominal pain, diarrhea or constipation, dysuria, or peripheral edema.  She states that she has been taking her outpatient medications regularly episode for today prior to her procedure.  Assessment & Plan:   Active Problems:   H/O open hand wound  [name] is an 53 y.o. female with medical history significant for chronic combined systolic and diastolic CHF (EF 02-40%, X7DZ, diffuse hypokinesis by TTE 05/2017), Atrial Fibrillation, COPD, T2DM, HTN, HLD, CKD Stage III who is admitted for repeat irrigation and debridement and pinning right hand wound with surgical skin grafting performed by orthopedics, Dr. Amedeo Plenty.  Recommendations for discharge when ready per orthopedics: Please discontinue plavix at discharge and prescribe eliquis.  Continue ASA. Patient not taking prozac, please discontinue this on discharge. She needs to follow up with cardiology outpatient. Please don't hesitate to call if assistance needed. Will continue to follow for now.  Type 2 diabetes:  On Tradjenta as an outpatient.  Last A1c 7 11/23/2018. Decadron 5 mg x 1, 1/28 lantus 5 units, continue SSI  ACHS Suspect some hyperglycemia in the setting of steroids BG's improved today  Chronic combined systolic and diastolic CHF:  EF 32-99%, G2DD, diffuse hypokinesis by TTE 05/2017.  She is euvolemic on exam with no signs of acute CHF exacerbation. -Continue Toprol-XL -resume lasix -Monitor I/O's and daily weights  Hypertension: Remains hypertensive -Continue home amlodipine, Toprol-XL.  Benicar currently on hold.  Lasix has been resumed as noted above. prn hydral ordered.  Paroxysmal atrial fibrillation: Chadsvasc score at least 6.   Eliquis has been resumed Not clear why she's on DAPT as well, but as noted yesterday, in previous hospitalization, plavix was discontinued.  I checked with Dr. Bronson Ing who hasn't seen her since that hospitalization.  Not clear why she's currently on DAPT, denies recent stent or hospitalization anywhere other than [facility].  Will d/c plavix, continue aspirin.  Needs close outpatient follow up. Rate controled, continue metop  Open right hand wound status post I&D and surgical skin grafting by Dr. Amedeo Plenty 11/22/18: Currently on Augmentin, continue postop care per orthopedics.  CKD stage III: Will obtain basic metabolic panel in [name].m.  Hx of CVA: Continue aspirin and d/c Plavix as noted above.   Depression/GAD: Continue Cymbalta.  COPD: Currently stable.  Continue Spiriva, Singulair, and as needed nebulizers.  HLD: Continue Crestor and Zetia.   DVT prophylaxis: SCD Code Status: full  Family Communication: none at bedside Disposition Plan: per surgery   Consultants:   Orthopedics is primary  Procedures:   I&D of skin, subcutaneous tissue, hand, and right ring finger  Preparation of skin bed for skin grafting  Open arthrotomy and synovectomy proximal interphalangeal joint, R ring finger  Pinning R ring finger  proximal interphalangeal joint  Full thickness skin graft, right ring finger and web space  1/28  Antimicrobials:   Anti-infectives (From admission, onward)   Start     Dose/Rate Route Frequency Ordered Stop   11/23/18 1000  amoxicillin-clavulanate (AUGMENTIN) 875-125 MG per tablet 1 tablet    Note to Pharmacy:  Started 14 day course on 11-16-17     1 tablet Oral 2 times daily 11/22/18 2305     11/23/18 1000  ceFAZolin (ANCEF) IVPB 1 g/50 mL premix  Status:  Discontinued     1 g 100 mL/hr over 30 Minutes Intravenous Every 8 hours 11/22/18 2305 11/24/18 1005   11/23/18 0200  ceFAZolin (ANCEF) IVPB 1 g/50 mL premix     1 g 100 mL/hr over 30 Minutes Intravenous NOW 11/22/18 2305 11/23/18 0216   11/22/18 1845  ceFAZolin (ANCEF) IVPB 2g/100 mL premix     2 g 200 mL/hr over 30 Minutes Intravenous  Once 11/22/18 1834 11/22/18 2030         Subjective: No concerns  Objective: Vitals:   11/24/18 2039 11/25/18 0346 11/25/18 0904 11/25/18 1022  BP: (!) 141/86 (!) 132/92  (!) 132/92  Pulse: 64 99 68 68  Resp: 16 16 16    Temp: 98 F (36.7 C) 98.2 F (36.8 C)    TempSrc: Oral Oral    SpO2: 91% 96% 97%   Weight:      Height:        Intake/Output Summary (Last 24 hours) at 11/25/2018 1037 Last data filed at 11/24/2018 1700 Gross per 24 hour  Intake 600 ml  Output 1850 ml  Net -1250 ml   Filed Weights   11/22/18 1806  Weight: 78 kg    Examination:  General: No acute distress. Cardiovascular: Heart sounds show [name] regular rate, and rhythm Lungs: Clear to auscultation bilaterally  Abdomen: Soft, nontender, nondistended  Neurological: Alert and oriented 3. Moves all extremities 4. Cranial nerves II through XII grossly intact. Skin: Warm and dry. No rashes or lesions. Extremities: R arm with intact dressing Psychiatric: Mood and affect are normal. Insight and judgment are appropriate.   Data Reviewed: I have personally reviewed following labs and imaging studies  CBC: Recent Labs  Lab 11/22/18 1900 11/23/18 0303 11/24/18 0241 11/25/18 0542  WBC  --  6.9 8.0 7.8  HGB 12.9 10.8* 10.8*  11.0*  HCT 38.0 35.8* 35.6* 35.5*  MCV  --  90.2 90.1 91.3  PLT  --  223 227 102   Basic Metabolic Panel: Recent Labs  Lab 11/22/18 1900 11/23/18 0303 11/24/18 0241 11/25/18 0542  NA 143 140 140 140  K 3.6 3.8 3.9 4.1  CL  --  104 107 106  CO2  --  23 25 25   GLUCOSE 104* 243* 148* 180*  BUN  --  21* 24* 33*  CREATININE  --  1.34* 1.48* 1.41*  CALCIUM  --  9.0 8.7* 8.9  MG  --   --  1.8 1.7   GFR: Estimated Creatinine Clearance: 43.1 mL/min ([name]) (by C-G formula based on SCr of 1.41 mg/dL (H)). Liver Function Tests: No results for input(s): AST, ALT, ALKPHOS, BILITOT, PROT, ALBUMIN in the last 168 hours. No results for input(s): LIPASE, AMYLASE in the last 168 hours. No results for input(s): AMMONIA in the last 168 hours. Coagulation Profile: No results for input(s): INR, PROTIME in the last 168 hours. Cardiac Enzymes: No results for input(s): CKTOTAL, CKMB, CKMBINDEX, TROPONINI in the last 168 hours.  BNP (last 3 results) No results for input(s): PROBNP in the last 8760 hours. HbA1C: Recent Labs    11/23/18 0303  HGBA1C 7.0*   CBG: Recent Labs  Lab 11/24/18 0634 11/24/18 1154 11/24/18 1635 11/24/18 2147 11/25/18 0626  GLUCAP 142* 141* 158* 126* 150*   Lipid Profile: No results for input(s): CHOL, HDL, LDLCALC, TRIG, CHOLHDL, LDLDIRECT in the last 72 hours. Thyroid Function Tests: No results for input(s): TSH, T4TOTAL, FREET4, T3FREE, THYROIDAB in the last 72 hours. Anemia Panel: No results for input(s): VITAMINB12, FOLATE, FERRITIN, TIBC, IRON, RETICCTPCT in the last 72 hours. Sepsis Labs: No results for input(s): PROCALCITON, LATICACIDVEN in the last 168 hours.  No results found for this or any previous visit (from the past 240 hour(s)).       Radiology Studies: No results found.      Scheduled Meds: . amiodarone  200 mg Oral BID  . amLODipine  10 mg Oral Daily  . amoxicillin-clavulanate  1 tablet Oral BID  . apixaban  5 mg Oral BID  .  aspirin EC  81 mg Oral Daily  . darifenacin  7.5 mg Oral Daily  . docusate sodium  100 mg Oral BID  . DULoxetine  60 mg Oral Daily  . ezetimibe  10 mg Oral Daily  . furosemide  40 mg Oral Daily  . insulin aspart  0-5 Units Subcutaneous QHS  . insulin aspart  0-9 Units Subcutaneous TID WC  . insulin glargine  5 Units Subcutaneous Daily  . metoprolol succinate  50 mg Oral BID  . montelukast  10 mg Oral QHS  . multivitamin with minerals  1 tablet Oral Daily  . pantoprazole  40 mg Oral Daily  . potassium chloride SA  30 mEq Oral Daily  . rosuvastatin  10 mg Oral Daily  . traZODone  50 mg Oral QHS  . umeclidinium bromide  1 puff Inhalation Daily  . vitamin C  1,000 mg Oral Daily   Continuous Infusions: . lactated ringers Stopped (11/23/18 0146)     LOS: 3 days    Time spent: over 30 min    [name], MD [facility] Pager AMION  If 7PM-7AM, please contact night-coverage [email or web address] Password [facility] 11/25/2018, 10:37 AM

## 2018-11-25 NOTE — Consult Note (Addendum)
   [facility] Inpatient Consult   11/25/2018  [name] 11-26-1965 [id number]     [facility] hospital liaison follow up.  Met with [name] at bedside to discuss ongoing [facility] services. She remains agreeable. [name] states she is interested in restarting services with [facility] for chronic disease management. States " I need someone to help stay on my p's and q's."    [name] reports she is now receiving transportation assistance thru [facility]. [facility] Social Worker has been working closely with [name] for transportation needs.   Discussed [place] follow up for medication review due to medication changes. She is agreeable to this referral as well.  [name] expressed appreciation of the visit.  Will make referral to [facility] Pharmacist for follow up post hospital discharge.  Patient's PCP office is listed as doing transition of care call.   Explained to [name] that [facility] will not interfere or replace any services provided by home health.  Unplanned readmission risk score is 45% (extreme). [name] also has a medical history of CHF, CVA, COPD, HLD, DM, PAF, HTN.  Made inpatient RNCM aware [facility] is active.  Confirmed best contact number for [name] as [phone].   Marthenia Rolling, MSN-Ed, RN,BSN [facility] Liaison [phone]

## 2018-11-25 NOTE — Progress Notes (Signed)
Patient ID: [name], female   DOB: [birth year], 53 y.o.   MRN: [id number] Patient is here for evaluation.  Patient is doing quite well.  Vital signs stable Tolerating regular diet.  She is ambulatory and has no dysuria signs of DVT or other complications.  Certainly appreciate the help of the consultants.  At present time we will plan for transition to home tomorrow.  She will need home health for ambulation, home needs, and other measures to include dressing changes to the proximal skin graft site of harvest.  We will leave the finger completely alone and not manipulated.  Patient not had a long discussion about many and all issues.  At present time we will do our best to salvage her finger.  I feel this is the best and most optimal time to try and afford salvage.  If the skin graft can take and we keep her still for 3 to 4 weeks hopefully will see improvement and a definitive salvage.  If she fails one would certainly revisit the amputation options.  She understands this.  She is in good spirits and ready for discharge.  I certainly appreciate the help of Manuela Schwartz and our care management team in regards to the home health needs and issues.  Jamichael Knotts MD

## 2018-11-25 NOTE — Care Management Important Message (Signed)
Important Message  Patient Details  Name: [name] MRN: [id number] Date of Birth: [birth year]   Medicare Important Message Given:  Yes    [name] 11/25/2018, 2:44 PM

## 2018-11-25 NOTE — Care Management Note (Signed)
Case Management Note  Patient Details  Name: [name] MRN: [id number] Date of Birth: 06/17/1966  Subjective/Objective:     53 yr old female admitted with right hand open wound. Underwent I &D with skin Irrigation and debridement of skin, subcutaneous tissue, hand and right ring finger. 2.  Preparation of skin bed for skin grafting. 3.  Open arthrotomy and synovectomy proximal interphalangeal  joint, right ring finger. 4.  Pinning right ring finger proximal interphalangeal  joint. 5.  A cm x 2 cm full-thickness skin graft, right ring finger and web space.            Action/Plan: Case manager spoke with patient concerning discharge plan and choice for Home Health agency. Patient has had in depth discussion with Dr. Amedeo Plenty, CM and Marthenia Rolling, [facility] CM concerning her needs and importance of follow through. Patient very pleasant and expresses gratitude for getting the help she needs. Referral for Home Health called to Neoma Laming, [place] Liaison. Patient lives alone and will need aide assistance as well. CM notified [place] arrangements.    Expected Discharge Date:     Pending.             Expected Discharge Plan:  [place]  In-House Referral:  [facility]  Discharge planning Services  CM Consult  Post Acute Care Choice:  Home Health Choice offered to:  Patient  DME Arranged:  N/A DME Agency:  NA  HH Arranged:  RN, PT, Nurse's Aide [place] Agency:  [place]  Status of Service:  Completed, signed off  If discussed at [place] of Stay Meetings, dates discussed:    Additional Comments:  Ninfa Meeker, RN 11/25/2018, 1:03 PM

## 2018-11-25 NOTE — Care Management (Signed)
Case manager spoke with patient concerning her Eliquis. Patient has been taking this medication and it is covered by her medicaid. CM informed Dr. Florene Glen of same.

## 2018-11-26 LAB — GLUCOSE, CAPILLARY
Glucose-Capillary: 141 mg/dL — ABNORMAL HIGH (ref 70–99)
Glucose-Capillary: 169 mg/dL — ABNORMAL HIGH (ref 70–99)
Glucose-Capillary: 141 mg/dL — ABNORMAL HIGH (ref 70–99)

## 2018-11-26 MED ORDER — APIXABAN 5 MG PO TABS: 5.0000 mg | ORAL_TABLET | Freq: Two times a day (BID) | ORAL | 0 refills | Status: DC

## 2018-11-26 MED ORDER — HYDROCODONE-ACETAMINOPHEN 5-325 MG PO TABS: 1.0000 | ORAL_TABLET | Freq: Four times a day (QID) | ORAL | 0 refills | Status: DC | PRN

## 2018-11-26 NOTE — Plan of Care (Signed)
Problem: Education: Goal: Knowledge of General Education information will improve Description Including pain rating scale, medication(s)/side effects and non-pharmacologic comfort measures Outcome: Progressing   Problem: Health Behavior/Discharge Planning: Goal: Ability to manage health-related needs will improve Outcome: Progressing   Problem: Clinical Measurements: Goal: Respiratory complications will improve Outcome: Progressing Goal: Cardiovascular complication will be avoided Outcome: Progressing   Problem: Nutrition: Goal: Adequate nutrition will be maintained Outcome: Progressing   Problem: Coping: Goal: Level of anxiety will decrease Outcome: Progressing   Problem: Elimination: Goal: Will not experience complications related to urinary retention Outcome: Progressing   Problem: Pain Managment: Goal: General experience of comfort will improve Outcome: Progressing   Problem: Safety: Goal: Ability to remain free from injury will improve Outcome: Progressing   Problem: Skin Integrity: Goal: Risk for impaired skin integrity will decrease Outcome: Progressing   

## 2018-11-26 NOTE — Progress Notes (Signed)
PT Cancellation Note  Patient Details Name: [name] MRN: [id number] DOB: [birth year]   Cancelled Treatment:    Reason Eval/Treat Not Completed: Patient declined, no reason specified Patient declined mobility despite encouragement. I suspect she will need a platform for her RW (pt has RW from home in room) due to R wrist/hand splint. PT will continue to follow acutely.    Salina April, PTA Acute Rehabilitation Services Pager: [phone] Office: [phone]   11/26/2018, 11:04 AM

## 2018-11-26 NOTE — Progress Notes (Signed)
Consult NOTE    [name]  ZOX:[id number] DOB: 08/12/1966 DOA: 11/22/2018 PCP: Barbara Helper, MD   Brief Narrative: [name] is an 53 y.o. female with medical history significant for chronic combined systolic and diastolic CHF (EF 81-19%, J4NW, diffuse hypokinesis by TTE 05/2017), Atrial Fibrillation, COPD, T2DM, HTN, HLD, CKD Stage III who is admitted for repeat irrigation and debridement and pinning right hand wound with surgical skin grafting performed by orthopedics, [name].   Initial wound occurred by dog bite approximately 6 weeks ago and underwent I&D on 10/11/2018 and 10/13/2018 and has been on wound VAC as an outpatient prior to this procedure.  Patient seen by me in the PACU postop.  She currently denies any fevers, chills, diaphoresis, chest pain, palpitations, diaphoresis, abdominal pain, diarrhea or constipation, dysuria, or peripheral edema.  She states that she has been taking her outpatient medications regularly episode for today prior to her procedure.  Assessment & Plan:   Active Problems:   H/O open hand wound  [name] is an 53 y.o. female with medical history significant for chronic combined systolic and diastolic CHF (EF 29-56%, O1HY, diffuse hypokinesis by TTE 05/2017), Atrial Fibrillation, COPD, T2DM, HTN, HLD, CKD Stage III who is admitted for repeat irrigation and debridement and pinning right hand wound with surgical skin grafting performed by orthopedics, [name].  Recommendations for discharge when ready per orthopedics: Please discontinue plavix at discharge and prescribe eliquis.  Continue ASA. Patient not taking prozac, please discontinue this on discharge. She needs to follow up with cardiology outpatient. Please don't hesitate to call if assistance needed. Will sign off as pt being discharged today.  Type 2 diabetes:  On Tradjenta as an outpatient.  Last A1c 7 11/23/2018. Decadron 5 mg x 1, 1/28 lantus 5 units while  inpatient, continue SSI ACHS Suspect some hyperglycemia in the setting of steroids BG's improved today  Chronic combined systolic and diastolic CHF:  EF 86-57%, G2DD, diffuse hypokinesis by TTE 05/2017.  She is euvolemic on exam with no signs of acute CHF exacerbation. -Continue Toprol-XL -resume lasix -Monitor I/O's and daily weights  Hypertension: Remains hypertensive -Continue home amlodipine, Toprol-XL.  Benicar currently on hold.  Lasix has been resumed as noted above. prn hydral ordered.  Paroxysmal atrial fibrillation: Chadsvasc score at least 6.   Eliquis has been resumed Not clear why she's on DAPT as well, but as noted previously, in previous hospitalization, plavix was discontinued.  I checked with [name] who hasn't seen her since that hospitalization.  Not clear why she's currently on DAPT, denies recent stent or hospitalization anywhere other than [facility].  Will d/c plavix, continue aspirin.  Needs close outpatient follow up with cardiology. Rate controled, continue metop  Open right hand wound status post I&D and surgical skin grafting by [name] 11/22/18: Currently on Augmentin, continue postop care per orthopedics.  CKD stage III: Will obtain basic metabolic panel in [name].m.  Hx of CVA: Continue aspirin and d/c Plavix as noted above.   Depression/GAD: Continue Cymbalta.  COPD: Currently stable.  Continue Spiriva, Singulair, and as needed nebulizers.  HLD: Continue Crestor and Zetia.   DVT prophylaxis: SCD Code Status: full  Family Communication: none at bedside Disposition Plan: per surgery   Consultants:   Orthopedics is primary  Procedures:   I&D of skin, subcutaneous tissue, hand, and right ring finger  Preparation of skin bed for skin grafting  Open arthrotomy and synovectomy proximal interphalangeal joint, R ring  finger  Pinning R ring finger proximal interphalangeal joint  Full thickness skin graft, right ring finger  and web space  1/28  Antimicrobials:  Anti-infectives (From admission, onward)   Start     Dose/Rate Route Frequency Ordered Stop   11/23/18 1000  amoxicillin-clavulanate (AUGMENTIN) 875-125 MG per tablet 1 tablet    Note to Pharmacy:  Started 14 day course on 11-16-17     1 tablet Oral 2 times daily 11/22/18 2305     11/23/18 1000  ceFAZolin (ANCEF) IVPB 1 g/50 mL premix  Status:  Discontinued     1 g 100 mL/hr over 30 Minutes Intravenous Every 8 hours 11/22/18 2305 11/24/18 1005   11/23/18 0200  ceFAZolin (ANCEF) IVPB 1 g/50 mL premix     1 g 100 mL/hr over 30 Minutes Intravenous NOW 11/22/18 2305 11/23/18 0216   11/22/18 1845  ceFAZolin (ANCEF) IVPB 2g/100 mL premix     2 g 200 mL/hr over 30 Minutes Intravenous  Once 11/22/18 1834 11/22/18 2030         Subjective: Doing well Looking forward to discharge  Objective: Vitals:   11/25/18 1022 11/25/18 1356 11/25/18 2019 11/26/18 0440  BP: (!) 132/92 119/90 124/89 111/79  Pulse: 68 64 64 60  Resp:  18 16 20   Temp:   97.9 F (36.6 C) 98 F (36.7 C)  TempSrc:   Oral Oral  SpO2:  100% 100% 100%  Weight:      Height:        Intake/Output Summary (Last 24 hours) at 11/26/2018 0839 Last data filed at 11/25/2018 1700 Gross per 24 hour  Intake 720 ml  Output 1000 ml  Net -280 ml   Filed Weights   11/22/18 1806  Weight: 78 kg    Examination:  General: No acute distress. Cardiovascular: Heart sounds show [name] regular rate, and rhythm.  Lungs: Clear to auscultation bilaterally  Abdomen: Soft, nontender, nondistended Neurological: Alert and oriented 3. Moves all extremities 4. Cranial nerves II through XII grossly intact. Skin: Warm and dry. No rashes or lesions. Extremities: RUE with dressing in place Psychiatric: Mood and affect are normal. Insight and judgment are appropriate.   Data Reviewed: I have personally reviewed following labs and imaging studies  CBC: Recent Labs  Lab 11/22/18 1900 11/23/18 0303  11/24/18 0241 11/25/18 0542  WBC  --  6.9 8.0 7.8  HGB 12.9 10.8* 10.8* 11.0*  HCT 38.0 35.8* 35.6* 35.5*  MCV  --  90.2 90.1 91.3  PLT  --  223 227 503   Basic Metabolic Panel: Recent Labs  Lab 11/22/18 1900 11/23/18 0303 11/24/18 0241 11/25/18 0542  NA 143 140 140 140  K 3.6 3.8 3.9 4.1  CL  --  104 107 106  CO2  --  23 25 25   GLUCOSE 104* 243* 148* 180*  BUN  --  21* 24* 33*  CREATININE  --  1.34* 1.48* 1.41*  CALCIUM  --  9.0 8.7* 8.9  MG  --   --  1.8 1.7   GFR: Estimated Creatinine Clearance: 43.1 mL/min (Adilynn Bessey) (by C-G formula based on SCr of 1.41 mg/dL (H)). Liver Function Tests: No results for input(s): AST, ALT, ALKPHOS, BILITOT, PROT, ALBUMIN in the last 168 hours. No results for input(s): LIPASE, AMYLASE in the last 168 hours. No results for input(s): AMMONIA in the last 168 hours. Coagulation Profile: No results for input(s): INR, PROTIME in the last 168 hours. Cardiac Enzymes: No results for input(s): CKTOTAL,  CKMB, CKMBINDEX, TROPONINI in the last 168 hours. BNP (last 3 results) No results for input(s): PROBNP in the last 8760 hours. HbA1C: No results for input(s): HGBA1C in the last 72 hours. CBG: Recent Labs  Lab 11/25/18 0626 11/25/18 1126 11/25/18 1637 11/25/18 2231 11/26/18 0602  GLUCAP 150* 141* 167* 192* 141*   Lipid Profile: No results for input(s): CHOL, HDL, LDLCALC, TRIG, CHOLHDL, LDLDIRECT in the last 72 hours. Thyroid Function Tests: No results for input(s): TSH, T4TOTAL, FREET4, T3FREE, THYROIDAB in the last 72 hours. Anemia Panel: No results for input(s): VITAMINB12, FOLATE, FERRITIN, TIBC, IRON, RETICCTPCT in the last 72 hours. Sepsis Labs: No results for input(s): PROCALCITON, LATICACIDVEN in the last 168 hours.  No results found for this or any previous visit (from the past 240 hour(s)).       Radiology Studies: No results found.      Scheduled Meds: . amiodarone  200 mg Oral BID  . amLODipine  10 mg Oral Daily  .  amoxicillin-clavulanate  1 tablet Oral BID  . apixaban  5 mg Oral BID  . aspirin EC  81 mg Oral Daily  . darifenacin  7.5 mg Oral Daily  . docusate sodium  100 mg Oral BID  . DULoxetine  60 mg Oral Daily  . ezetimibe  10 mg Oral Daily  . furosemide  40 mg Oral Daily  . insulin aspart  0-5 Units Subcutaneous QHS  . insulin aspart  0-9 Units Subcutaneous TID WC  . insulin glargine  5 Units Subcutaneous Daily  . metoprolol succinate  50 mg Oral BID  . montelukast  10 mg Oral QHS  . multivitamin with minerals  1 tablet Oral Daily  . pantoprazole  40 mg Oral Daily  . potassium chloride SA  30 mEq Oral Daily  . rosuvastatin  10 mg Oral Daily  . traZODone  50 mg Oral QHS  . umeclidinium bromide  1 puff Inhalation Daily  . vitamin C  1,000 mg Oral Daily   Continuous Infusions: . lactated ringers Stopped (11/23/18 0146)     LOS: 4 days    Time spent: over 30 min    Barbara Helper, MD [facility] Pager AMION  If 7PM-7AM, please contact night-coverage [email or web address] Password [facility] 11/26/2018, 8:39 AM

## 2018-11-26 NOTE — Care Management (Signed)
[facility] does not have platform for walker available until Monday.

## 2018-11-26 NOTE — Discharge Summary (Signed)
[name] was admitted postoperatively after skin grafting procedure.  The patient was originally admitted for a salvage attempt for finger due to a severe infection. She was sent down from [facility]. The patient underwent multiple irrigation and debridement and then was transitioned to home. We had great difficulty obtaining home health during this time and ultimately the patient had frequent office visits and application of a vacuum-assisted closure device. She reached the point where we could attempt skin grafting.  She underwent skin grafting 4 days ago and has done relatively well.  I discussed with patient that hopefully this skin graft will take if it does not would once again revisit the options of an amputation and she understands this.  She was admitted Tuesday night and had no complications.  Medicine service was kind enough to see her and manage her medically.  At the time discharge he was rec Lommended she follow-up with cardiology and it was also recommended she stop Plavix and go back on her eloquis. She was informed of this.  Patient was stable awake and alert at the time of discharge without complications.  No UTI, DVT or other hospital complicating features.  Discharge diet regular  Follow-up in my office February 10 as outlined in the discharge notes Physician Discharge Summary  Patient ID: MIQUELA COSTABILE MRN: [id number] DOB/AGE: [birth year] 53 y.o.  Admit date: 11/22/2018 Discharge date:   Admission Diagnoses: Right Ring Finger Wound Past Medical History:  Diagnosis Date  . Alcohol use   . Ankle fracture, lateral malleolus, closed 12/30/2011  . Anxiety   . Breast mass, left 12/15/2011  . Chronic anemia   . Chronic combined systolic and diastolic CHF (congestive heart failure) ([place])   . CKD (chronic kidney disease), stage III ([place])    pt denies  . Cocaine abuse ([place])   . COPD (chronic obstructive pulmonary disease) ([place])   . Diabetes  mellitus, type 2 ([place])   . Diabetic Charcot foot ([place]) 12/30/2011  . Essential hypertension   . History of GI bleed 12/05/2009   Qualifier: Diagnosis of  By: Zeb Comfort    . Hyperlipidemia   . Hypertensive cardiomyopathy ([place]) 11/08/2012   a. 05/2016: echo showing EF of 20-25% with normal cors by cath in 07/2016.  Marland Kitchen Noncompliance with medication regimen   . Obesity   . Panic attacks   . PAT (paroxysmal atrial tachycardia) ([place])    a. started on amiodarone 07/2017 for this.  . Sleep apnea    does not use cpap  . Stroke ([place])    06/24/17  . Tobacco abuse   . Urinary incontinence     Discharge Diagnoses:  Active Problems:   H/O open hand wound   Surgeries: Procedure(s): IRRIGATION AND DEBRIDEMENT AND PINNING RIGHT HAND SKIN GRAFT SPLIT THICKNESS on 11/22/2018    Consultants:   Discharged Condition: Improved  Hospital Course: THARA SEARING is an 53 y.o. female who was admitted 11/22/2018 with a chief complaint of No chief complaint on file. , and found to have a diagnosis of Right Ring Finger Wound.  They were brought to the operating room on 11/22/2018 and underwent Procedure(s): IRRIGATION AND DEBRIDEMENT AND PINNING RIGHT HAND SKIN GRAFT SPLIT THICKNESS.    They were given perioperative antibiotics:  Anti-infectives (From admission, onward)   Start     Dose/Rate Route Frequency Ordered Stop   11/23/18 1000  amoxicillin-clavulanate (AUGMENTIN) 875-125 MG per tablet 1 tablet    Note to Pharmacy:  Started 14  day course on 11-16-17     1 tablet Oral 2 times daily 11/22/18 2305     11/23/18 1000  ceFAZolin (ANCEF) IVPB 1 g/50 mL premix  Status:  Discontinued     1 g 100 mL/hr over 30 Minutes Intravenous Every 8 hours 11/22/18 2305 11/24/18 1005   11/23/18 0200  ceFAZolin (ANCEF) IVPB 1 g/50 mL premix     1 g 100 mL/hr over 30 Minutes Intravenous NOW 11/22/18 2305 11/23/18 0216   11/22/18 1845  ceFAZolin (ANCEF) IVPB 2g/100 mL premix     2 g 200 mL/hr over 30 Minutes  Intravenous  Once 11/22/18 1834 11/22/18 2030    .  They were given sequential compression devices, early ambulation, and Other (comment) for DVT prophylaxis.  Recent vital signs:  Patient Vitals for the past 24 hrs:  BP Temp Temp src Pulse Resp SpO2  11/26/18 0440 111/79 98 F (36.7 C) Oral 60 20 100 %  11/25/18 2019 124/89 97.9 F (36.6 C) Oral 64 16 100 %  11/25/18 1356 119/90 - - 64 18 100 %  11/25/18 1022 (!) 132/92 - - 68 - -  11/25/18 0904 - - - 68 16 97 %  .  Recent laboratory studies: No results found.  Discharge Medications:   Allergies as of 11/26/2018   No Known Allergies     Medication List    STOP taking these medications   clopidogrel 75 MG tablet Commonly known as:  PLAVIX   FLUoxetine 40 MG capsule Commonly known as:  PROZAC     TAKE these medications   albuterol 108 (90 Base) MCG/ACT inhaler Commonly known as:  PROAIR HFA INHALE 2 PUFFS EVERY 6 HOURS AS NEEDED FOR SHORTNESS OF BREATH/WHEEZING. What changed:    how much to take  how to take this  when to take this  reasons to take this  additional instructions   amiodarone 200 MG tablet Commonly known as:  PACERONE Take 1 tablet (200 mg total) by mouth 2 (two) times daily.   amLODipine 10 MG tablet Commonly known as:  NORVASC Take 1 tablet (10 mg total) by mouth daily.   amoxicillin-clavulanate 875-125 MG tablet Commonly known as:  AUGMENTIN Take 1 tablet by mouth 2 (two) times daily. Started 14 day course on 11-16-17   apixaban 5 MG Tabs tablet Commonly known as:  ELIQUIS Take 1 tablet (5 mg total) by mouth 2 (two) times daily for 30 days.   aspirin EC 81 MG tablet Take 1 tablet (81 mg total) by mouth daily.   DULoxetine 30 MG capsule Commonly known as:  CYMBALTA Take 60 mg by mouth daily.   ezetimibe 10 MG tablet Commonly known as:  ZETIA TAKE 1 TABLET BY MOUTH ONCE A DAY.   furosemide 20 MG tablet Commonly known as:  LASIX TAKE 1 TABLET BY MOUTH ONCE A DAY. What  changed:  how much to take   gabapentin 300 MG capsule Commonly known as:  NEURONTIN Take 1 capsule (300 mg total) by mouth 3 (three) times daily. 3 times a day when necessary neuropathy pain What changed:  additional instructions   HYDROcodone-acetaminophen 5-325 MG tablet Commonly known as:  NORCO/VICODIN Take 1 tablet by mouth every 6 (six) hours as needed for pain. What changed:  Another medication with the same name was added. Make sure you understand how and when to take each.   HYDROcodone-acetaminophen 5-325 MG tablet Commonly known as:  NORCO Take 1 tablet by mouth every 6 (six)  hours as needed for moderate pain. What changed:  You were already taking a medication with the same name, and this prescription was added. Make sure you understand how and when to take each.   ipratropium-albuterol 0.5-2.5 (3) MG/3ML Soln Commonly known as:  DUONEB Take 3 mLs by nebulization every 6 (six) hours as needed (wheezes or SOB).   metoprolol succinate 50 MG 24 hr tablet Commonly known as:  TOPROL-XL Take 1 tablet (50 mg total) by mouth 2 (two) times daily. Take with or immediately following a meal.   montelukast 10 MG tablet Commonly known as:  SINGULAIR TAKE (1) TABLET BY MOUTH AT BEDTIME. What changed:  See the new instructions.   olmesartan 40 MG tablet Commonly known as:  BENICAR TAKE 1 TABLET BY MOUTH ONCE A DAY.   pantoprazole 40 MG tablet Commonly known as:  PROTONIX TAKE 1 TABLET BY MOUTH ONCE A DAY.   potassium chloride SA 20 MEQ tablet Commonly known as:  K-DUR,KLOR-CON TAKE 1&1/2 TABLET BY MOUTH DAILY. What changed:  See the new instructions.   rosuvastatin 10 MG tablet Commonly known as:  CRESTOR TAKE 1 TABLET BY MOUTH ONCE A DAY. What changed:  when to take this   solifenacin 5 MG tablet Commonly known as:  VESICARE Take 1 tablet (5 mg total) by mouth daily.   SPIRIVA HANDIHALER 18 MCG inhalation capsule Generic drug:  tiotropium Place 18 mcg into inhaler  and inhale daily as needed (SHORTNESS OF BREATH).   TRADJENTA 5 MG Tabs tablet Generic drug:  linagliptin TAKE 1 TABLET BY MOUTH ONCE DAILY. What changed:  how much to take   traMADol 50 MG tablet Commonly known as:  ULTRAM TAKE (1) TABLET BY MOUTH AT BEDTIME FOR PAIN. What changed:    how much to take  how to take this  when to take this  reasons to take this  additional instructions   traZODone 50 MG tablet Commonly known as:  DESYREL Take 50 mg by mouth at bedtime.   Vitamin D (Ergocalciferol) 1.25 MG (50000 UT) Caps capsule Commonly known as:  DRISDOL TAKE 1 CAPSULE BY MOUTH ONCE A WEEK. What changed:    when to take this  additional instructions       Diagnostic Studies: No results found.  They benefited maximally from their hospital stay and there were no complications.     Disposition: Discharge disposition: 01-Home or Self Care      Discharge Instructions    AMB Referral to [place] Management   Complete by:  As directed    Please assign to [facility] for complex case management and for multiple co-morbidities. [facility] Pharmacist for medication review/education/adherence due to med changes. Unplanned readmission risk score 45% (extreme). Currently active with [facility] BSW. Currently at [facility]. PCP office ([place]) listed as doing toc. Please call with questions. Marthenia Rolling, [place], [facility] [phone]   Reason for consult:  Please assign to [place] [facility] Pharmacist   Diagnoses of:   Heart Failure COPD/ Pneumonia Diabetes     Expected date of contact:  1-3 days (reserved for hospital discharges)   Call MD / Call 911   Complete by:  As directed    If you experience chest pain or shortness of breath, CALL 911 and be transported to the hospital emergency room.  If you develope a fever above 101 F, pus (white drainage) or increased drainage or redness at the wound, or calf pain,  call  your surgeon's office.   Constipation Prevention   Complete by:  As directed    Drink plenty of fluids.  Prune juice may be helpful.  You may use a stool softener, such as Colace (over the counter) 100 mg twice a day.  Use MiraLax (over the counter) for constipation as needed.   Diet - low sodium heart healthy   Complete by:  As directed    Increase activity slowly as tolerated   Complete by:  As directed      Follow-up Information    [facility].   Specialty:  [place] Why:  A representative from [place] will contact you to arrange start date and time for your services. Contact information: [place] [address] [phone]        [name], MD Follow up in 10 day(s).   Specialty:  Orthopedic Surgery Why:  we will see you monday Feb 10th at 3 pm Contact information: [address] [place] [address] [phone]            Signed: Willa Frater III 11/26/2018, 9:00 AM

## 2018-11-26 NOTE — Progress Notes (Signed)
AVS given and reviewed with pt. Medications reviewed and discussed. All questions answered to satisfaction. Per CM, DME paper provided for pt to pick up platform piece on Monday 11/28/2018 for pt's personal walker. Pt verbalized understanding. Pt escorted off the unit via wheelchair by this RN.

## 2018-11-26 NOTE — Progress Notes (Signed)
Physical Therapy Treatment Patient Details Name: [name] MRN: [id number] DOB: May 12, 1966 Today's Date: 11/26/2018    History of Present Illness Patient admitted for I & D and right ring finger pinning, wound due to dog bite 6 weeks ago. PMH to include: ETOH, anxiety, COPD, DM, HTN, HLD, CHF, afib.      PT Comments    Patient seen for mobility progression. Pt tolerated gait distance of 200 ft with use of R platform RW. Overall pt is mod I/Supervision for mobility. Pt does demonstrate generalized weakness and educated on importance of increasing activity at home as pt's daughter and pt report she mainly uses w/c to mobilize in home. Pt educated to ambulate every hour in her home to increase strength and aid in healing. Continue to progress as tolerated.   Follow Up Recommendations  Home health PT     Equipment Recommendations  Other (comment)(R platform for RW (pt has RW already))    Recommendations for Other Services       Precautions / Restrictions Precautions Precautions: Fall Required Braces or Orthoses: Splint/Cast Splint/Cast: R hand Restrictions Weight Bearing Restrictions: No    Mobility  Bed Mobility               General bed mobility comments: pt up in chair upon arrival with family present  Transfers Overall transfer level: Needs assistance Equipment used: Right platform walker Transfers: Sit to/from Stand Sit to Stand: Supervision         General transfer comment: cues for safe hand placement  Ambulation/Gait Ambulation/Gait assistance: Supervision Gait Distance (Feet): 200 Feet Assistive device: Right platform walker Gait Pattern/deviations: Step-through pattern;Trunk flexed     General Gait Details: cues for posture   Stairs             Wheelchair Mobility    Modified Rankin (Stroke Patients Only)       Balance Overall balance assessment: Mild deficits observed, not formally tested                                           Cognition Arousal/Alertness: Awake/alert Behavior During Therapy: WFL for tasks assessed/performed Overall Cognitive Status: Within Functional Limits for tasks assessed                                        Exercises      General Comments        Pertinent Vitals/Pain Pain Assessment: Faces Faces Pain Scale: No hurt Pain Intervention(s): Monitored during session    Home Living                      Prior Function            PT Goals (current goals can now be found in the care plan section) Progress towards PT goals: Progressing toward goals    Frequency    Min 3X/week      PT Plan Current plan remains appropriate    Co-evaluation              AM-PAC PT "6 Clicks" Mobility   Outcome Measure  Help needed turning from your back to your side while in a flat bed without using bedrails?: None Help needed moving from lying on your back to sitting on  the side of a flat bed without using bedrails?: A Little Help needed moving to and from a bed to a chair (including a wheelchair)?: A Little Help needed standing up from a chair using your arms (e.g., wheelchair or bedside chair)?: A Little Help needed to walk in hospital room?: A Little Help needed climbing 3-5 steps with a railing? : A Little 6 Click Score: 19    End of Session Equipment Utilized During Treatment: Gait belt Activity Tolerance: Patient tolerated treatment well Patient left: with call bell/phone within reach;in chair;with family/visitor present Nurse Communication: Mobility status PT Visit Diagnosis: Other abnormalities of gait and mobility (R26.89)     Time: 1355-1416 PT Time Calculation (min) (ACUTE ONLY): 21 min  Charges:  $Gait Training: 8-22 mins                     Earney Navy, PTA Acute Rehabilitation Services Pager: [phone] Office: [phone]     Darliss Cheney 11/26/2018, 2:23 PM

## 2018-11-28 ENCOUNTER — Telehealth: Payer: Self-pay

## 2018-11-28 NOTE — Telephone Encounter (Signed)
Transition Care Management Follow-up Telephone Call   Date discharged?   Home              How have you been since you were released from the hospital? I am better, just all bandaged up    Do you understand why you were in the hospital? Wound care and debris of wound     Do you understand the discharge instructions? Yes I do   Where were you discharged to? Home   Items Reviewed:  Medications reviewed: Yes   Allergies reviewed: Yes   Dietary changes reviewed: Regular diet  Referrals reviewed: Yes   Functional Questionnaire:   Activities of Daily Living (ADLs):   I do need help with daily activities, would like for Dr. Moshe Cipro to see if she can get me some help around my house   Any transportation issues/concerns?: Have to travel with [facility]    Any patient concerns? Just concerned about having help   Confirmed importance and date/time of follow-up visits scheduled Yes has an appointment on Feb.12, 2020     Confirmed with patient if condition begins to worsen call PCP or go to the ER.  Patient was given the office number and encouraged to call back with question or concerns.  : Yes

## 2018-11-29 ENCOUNTER — Other Ambulatory Visit: Payer: Self-pay

## 2018-11-29 NOTE — Patient Outreach (Signed)
[facility]) Care Management  11/29/2018  ASTHA PROBASCO 06-11-1966 [id number]   Successful outreach to Ms. Godette post hospital discharge on 11/26/18.  Ms. Teodoro was previously referred to social work for transportation assistance.  She is aware that she has transportation benefits through [facility] [facility] [employer] and has utilized the service.  BSW provided her with [employer] contact information again today.   Ms. Wulf inquired about the status of [place] services through [place].  BSW agreed to check on status for her.  BSW contacted [facility] and [place] services were denied because she is not "homebound" and can be seen on an outpatient level.  BSW communicated with RNCM, [facility], about this.  [name] is awaiting a return call from Dr. Amedeo Plenty regarding treatment plan for Ms. Mckneely.  BSW will follow up with Ms. Mcgurn after hearing back about plan.  BSW will provide her with contact information for [place] if she would like to pursue [place] since [facility] services were denied.    Ronn Melena, BSW Social Worker [phone]

## 2018-11-30 ENCOUNTER — Other Ambulatory Visit: Payer: Self-pay

## 2018-11-30 ENCOUNTER — Ambulatory Visit: Payer: Self-pay | Admitting: Pharmacist

## 2018-11-30 ENCOUNTER — Other Ambulatory Visit: Payer: Self-pay | Admitting: Pharmacist

## 2018-11-30 NOTE — Patient Outreach (Addendum)
[facility]) Care Management  11/30/2018  [name] 02/27/1966 [id number]   Referral received for complex case management and multiple co-morbidities.   Successful outreach with [name]. HIPAA verifiers obtained. Reviewed reason for recent referral. Member very appreciative of outreach but reported that she needed Home Health services and assistance in the home. Reiterated scope of services and informed member that home health and personal care services were not provided through [facility]. Informed member that per recent discussion with home health staff the referral was denied. Discussed possible options for wound care and personal care services in [facility]. She reported being assured that home health services would be available after discharge. Also reported requesting assistance with personal care and housecleaning. Stated "I need help around the house" and expressed concerns regarding the cost of services. Member inquired about her sister [name]) receiving funding to serve as her primary caretaker. Agreeable to further discussion with [facility] SW. Reported that she would also discuss with her PCP.  [name] is pending post-op follow up with Dr. Amedeo Plenty and has a pending PCP appointment on 12/07/18. Member aware of pending outreach from [facility] Pharmacist and agreeable to f/u call from [facility] to confirm plan for wound care.   PLAN Will follow-up with Dr. Amedeo Plenty and his assigned nurse Evelena Peat. Will update [facility] care team. Will follow-up with member within a week.   [place] [phone]

## 2018-12-01 ENCOUNTER — Ambulatory Visit: Payer: Self-pay | Admitting: Pharmacist

## 2018-12-01 DIAGNOSIS — S61401A Unspecified open wound of right hand, initial encounter: Secondary | ICD-10-CM | POA: Diagnosis not present

## 2018-12-05 ENCOUNTER — Other Ambulatory Visit: Payer: Self-pay | Admitting: Pharmacist

## 2018-12-05 DIAGNOSIS — M79641 Pain in right hand: Secondary | ICD-10-CM | POA: Diagnosis not present

## 2018-12-05 DIAGNOSIS — R6889 Other general symptoms and signs: Secondary | ICD-10-CM | POA: Diagnosis not present

## 2018-12-05 NOTE — Patient Outreach (Signed)
[facility]) Care Management  [place]  12/05/2018  NATHANIEL YADEN 01/25/1966 [id number]   Reason for call: medication management  Unsuccessful telephone call attempt #1 to patient.   Unable to leave message. No VM set up. Rang>20 times.  Plan:  I will make another outreach attempt to patient within 3-4 business days  Will stay in contact with [place]  Regina Eck, PharmD, [place]  [phone]

## 2018-12-07 ENCOUNTER — Other Ambulatory Visit: Payer: Self-pay

## 2018-12-07 ENCOUNTER — Encounter: Payer: Self-pay | Admitting: Family Medicine

## 2018-12-07 ENCOUNTER — Ambulatory Visit: Payer: Self-pay

## 2018-12-07 ENCOUNTER — Ambulatory Visit (INDEPENDENT_AMBULATORY_CARE_PROVIDER_SITE_OTHER): Payer: Medicare Other | Admitting: Family Medicine

## 2018-12-07 ENCOUNTER — Other Ambulatory Visit: Payer: Self-pay | Admitting: Family Medicine

## 2018-12-07 VITALS — BP 162/108 | HR 92 | Resp 12 | Ht 61.0 in | Wt 166.0 lb

## 2018-12-07 DIAGNOSIS — R29898 Other symptoms and signs involving the musculoskeletal system: Secondary | ICD-10-CM | POA: Diagnosis not present

## 2018-12-07 DIAGNOSIS — I4891 Unspecified atrial fibrillation: Secondary | ICD-10-CM

## 2018-12-07 DIAGNOSIS — E785 Hyperlipidemia, unspecified: Secondary | ICD-10-CM | POA: Diagnosis not present

## 2018-12-07 DIAGNOSIS — E559 Vitamin D deficiency, unspecified: Secondary | ICD-10-CM | POA: Diagnosis not present

## 2018-12-07 DIAGNOSIS — R3981 Functional urinary incontinence: Secondary | ICD-10-CM

## 2018-12-07 DIAGNOSIS — L299 Pruritus, unspecified: Secondary | ICD-10-CM

## 2018-12-07 DIAGNOSIS — R6889 Other general symptoms and signs: Secondary | ICD-10-CM | POA: Diagnosis not present

## 2018-12-07 DIAGNOSIS — E1159 Type 2 diabetes mellitus with other circulatory complications: Secondary | ICD-10-CM

## 2018-12-07 DIAGNOSIS — I10 Essential (primary) hypertension: Secondary | ICD-10-CM | POA: Diagnosis not present

## 2018-12-07 MED ORDER — HYDROXYZINE HCL 25 MG PO TABS: ORAL_TABLET | ORAL | 3 refills | Status: DC

## 2018-12-07 MED ORDER — CLONIDINE HCL 0.3 MG PO TABS: ORAL_TABLET | ORAL | 3 refills | Status: DC

## 2018-12-07 NOTE — Progress Notes (Signed)
[name]     MRN: [id number]      DOB: Sep 22, 1966   HPI [name] is here for follow up and re-evaluation of chronic medical conditions, medication management and review of any available recent lab and radiology data.  Preventive health is updated, specifically  Cancer screening and Immunization.   Hospitalized 12/17 to 10/15/2018 with infected dog bite, and again from 1/28 to 11/26/2018 for skin grafting of right ring finger  Reports responding well, however requests and needs in home help due to falls, states fell 1 week ago, reports that legs feel weak, requests help with bathing and fixing in food and eating as right hand has been in a cast , initially long now has a short cast  Needs platform for walker Wants less costly vit D ROS Denies recent fever or chills. Denies sinus pressure, nasal congestion, ear pain or sore throat. Denies chest congestion, productive cough or wheezing. Denies chest pains, palpitations and leg swelling Denies abdominal pain, nausea, vomiting,diarrhea or constipation.   Denies dysuria, frequency, hesitancy e. . Denies headaches, seizures,  Denies uncontrolled  depression, anxiety or insomnia.  PE  BP (!) 162/108   Pulse 92   Resp 12   Ht 5\' 1"  (1.549 m)   Wt 166 lb 0.6 oz (75.3 kg)   LMP 05/19/2016   SpO2 98% Comment: room air  BMI 31.37 kg/m   Patient alert and oriented and in no cardiopulmonary distress.  HEENT: No facial asymmetry, EOMI,   oropharynx pink and moist.  Neck supple no JVD, no mass.  Chest: Clear to auscultation bilaterally.  CVS: S1, S2 no murmurs, no S3.Regular rate.  ABD: Soft non tender.   Ext: No edema  MS: decreased  ROM spine, shoulders, hips and knees.  Skin: Intact,right upper extremity in cast to elbow.  Psych: Good eye contact, normal affect.  not anxious or depressed appearing.  CNS: CN 2-12 intact, reduced power,  and tone in all extremities with muscle wasting noted.   Assessment &  Plan  HTN (hypertension), malignant Uncontrolled, add bedtime clonidine DASH diet and commitment to daily physical activity for a minimum of 30 minutes discussed and encouraged, as a part of hypertension management. The importance of attaining a healthy weight is also discussed.  BP/Weight 12/07/2018 11/26/2018 11/22/2018 10/15/2018 10/13/2018 10/11/2018 41/63/8453  Systolic BP 646 803 - 212 - - -  Diastolic BP 248 250 - 037 - - -  Wt. (Lbs) 166.04 - 172 - 171.96 - 158  BMI 31.37 - 32.5 - - 32.49 29.85  Some encounter information is confidential and restricted. Go to Review Flowsheets activity to see all data.       Hyperlipidemia LDL goal <70 Hyperlipidemia:Low fat diet discussed and encouraged.   Lipid Panel  Lab Results  Component Value Date   CHOL 144 12/07/2018   HDL 55 12/07/2018   LDLCALC 73 12/07/2018   LDLDIRECT 122 (H) 01/08/2010   TRIG 79 12/07/2018   CHOLHDL 2.6 12/07/2018   Updated lab has excellent LDL of 70 and hepatic panel is normal    Lower extremity weakness Reports lower extremity weakness  with recurrent near falls and instability, unable to care for herself without assistance and requests CAP, qualifies based on her level of debility, will look into this further with her partial medicaid coverage and get back in touch  Atrial fibrillation with rapid ventricular response ([place]) Rate currently controled and she is maintained on anticoagullant  Weakness of right upper extremity  RUE incapable of normal function due to dog bite followed by significant complications, recently hospitalized for skin grafting  Incapable of self care independently, including bathing, dressing , toileting, needs in home help, will request CAp services Reports needing specific durable equipment, ? Elevation on walker to accomodate injured hand States Ortho requested this but supplier has not filled requesting help  Type 2 diabetes mellitus with vascular disease [facility]) [name]  is reminded of the importance of commitment to daily physical activity for 30 minutes or more, as able and the need to limit carbohydrate intake to 30 to 60 grams per meal to help with blood sugar control.   The need to take medication as prescribed, test blood sugar as directed, and to call between visits if there is a concern that blood sugar is uncontrolled is also discussed.   [name] is reminded of the importance of daily foot exam, annual eye examination, and good blood sugar, blood pressure and cholesterol control. Controlled, no change in medication   Diabetic Labs Latest Ref Rng & Units 12/07/2018 11/25/2018 11/24/2018 11/23/2018 10/13/2018  HbA1c 4.8 - 5.6 % - - - 7.0(H) -  Microalbumin Not estab mg/dL - - - - -  Micro/Creat Ratio <30 mcg/mg creat - - - - -  Chol <200 mg/dL 144 - - - -  HDL > OR = 50 mg/dL 55 - - - -  Calc LDL mg/dL (calc) 73 - - - -  Triglycerides <150 mg/dL 79 - - - -  Creatinine 0.44 - 1.00 mg/dL - 1.41(H) 1.48(H) 1.34(H) 1.44(H)  GFR >60.00 mL/min - - - - -   BP/Weight 12/07/2018 11/26/2018 11/22/2018 10/15/2018 10/13/2018 10/11/2018 44/12/4740  Systolic BP 595 638 - 756 - - -  Diastolic BP 433 295 - 188 - - -  Wt. (Lbs) 166.04 - 172 - 171.96 - 158  BMI 31.37 - 32.5 - - 32.49 29.85  Some encounter information is confidential and restricted. Go to Review Flowsheets activity to see all data.   Foot/eye exam completion dates Latest Ref Rng & Units 07/26/2018 03/16/2017  Eye Exam No Retinopathy - Retinopathy(A)  Foot exam Order - - -  Foot Form Completion - Done -        Urinary incontinence Not well Controlled, despite medication still needs incontinence garments, marked safe mobility is a major problem.No change in medication   Pruritus Start bed time hydroxyzine

## 2018-12-07 NOTE — Patient Instructions (Addendum)
F/U in 5 to 6 weeks, call if you need me sooner  New medication  For blood pressure is clonidine one  At bedtime  New is hydroxyzine 1 at night for itching  Please get vit D, hepatic, lipid panel today  We will reach out for CAP services on your behalf ( you have partial medicaid)  We will send for / ask for platrform for walker

## 2018-12-08 ENCOUNTER — Other Ambulatory Visit: Payer: Self-pay | Admitting: Pharmacist

## 2018-12-08 ENCOUNTER — Other Ambulatory Visit: Payer: Self-pay | Admitting: Family Medicine

## 2018-12-08 LAB — HEPATIC FUNCTION PANEL
AG Ratio: 1.2 (calc) (ref 1.0–2.5)
ALT: 9 U/L (ref 6–29)
AST: 12 U/L (ref 10–35)
Albumin: 3.9 g/dL (ref 3.6–5.1)
Alkaline phosphatase (APISO): 134 U/L (ref 37–153)
Bilirubin, Direct: 0.1 mg/dL (ref 0.0–0.2)
Globulin: 3.2 g/dL (calc) (ref 1.9–3.7)
Indirect Bilirubin: 0.3 mg/dL (calc) (ref 0.2–1.2)
Total Bilirubin: 0.4 mg/dL (ref 0.2–1.2)
Total Protein: 7.1 g/dL (ref 6.1–8.1)

## 2018-12-08 LAB — LIPID PANEL
Cholesterol: 144 mg/dL (ref <200)
HDL: 55 mg/dL (ref >=50)
LDL Cholesterol (Calc): 73 mg/dL (calc)
Non-HDL Cholesterol (Calc): 89 mg/dL (calc) (ref <130)
Total CHOL/HDL Ratio: 2.6 (calc) (ref <5.0)
Triglycerides: 79 mg/dL (ref <150)

## 2018-12-08 LAB — VITAMIN D 25 HYDROXY (VIT D DEFICIENCY, FRACTURES): Vit D, 25-Hydroxy: 23 ng/mL — ABNORMAL LOW (ref 30–100)

## 2018-12-08 NOTE — Patient Outreach (Signed)
[place] [facility]) Care Management   [name] 1966/04/27 [id number]   Attempted follow up outreach with [name]. Outreach unsuccessful. Phone rang for over one minute without option to leave voice message. PLAN Will follow-up within 3-4 business days.  [place] [phone]

## 2018-12-08 NOTE — Patient Outreach (Addendum)
[facility] [facility]) Care Management  [place]  12/08/2018  [name] January 25, 1966 [id number]   Reason for call: medication assistance   Unsuccessful telephone call attempt #2 to patient.   Unable to leave message  Plan:  I will make another outreach attempt to patient within 3-4 business days   Regina Eck, PharmD, [place]  [phone]

## 2018-12-09 ENCOUNTER — Encounter: Payer: Self-pay | Admitting: Family Medicine

## 2018-12-11 ENCOUNTER — Telehealth: Payer: Self-pay | Admitting: Family Medicine

## 2018-12-11 ENCOUNTER — Encounter: Payer: Self-pay | Admitting: Family Medicine

## 2018-12-11 DIAGNOSIS — R29898 Other symptoms and signs involving the musculoskeletal system: Secondary | ICD-10-CM | POA: Insufficient documentation

## 2018-12-11 MED ORDER — SOLIFENACIN SUCCINATE 5 MG PO TABS: 5.0000 mg | ORAL_TABLET | Freq: Every day | ORAL | 5 refills | Status: DC

## 2018-12-11 NOTE — Assessment & Plan Note (Signed)
Uncontrolled, add bedtime clonidine DASH diet and commitment to daily physical activity for a minimum of 30 minutes discussed and encouraged, as a part of hypertension management. The importance of attaining a healthy weight is also discussed.  BP/Weight 12/07/2018 11/26/2018 11/22/2018 10/15/2018 10/13/2018 10/11/2018 08/19/8526  Systolic BP 782 423 - 536 - - -  Diastolic BP 144 315 - 400 - - -  Wt. (Lbs) 166.04 - 172 - 171.96 - 158  BMI 31.37 - 32.5 - - 32.49 29.85  Some encounter information is confidential and restricted. Go to Review Flowsheets activity to see all data.

## 2018-12-11 NOTE — Assessment & Plan Note (Signed)
Start bed time hydroxyzine

## 2018-12-11 NOTE — Assessment & Plan Note (Signed)
RUE incapable of normal function due to dog bite followed by significant complications, recently hospitalized for skin grafting  Incapable of self care independently, including bathing, dressing , toileting, needs in home help, will request CAp services Reports needing specific durable equipment, ? Elevation on walker to accomodate injured hand States Ortho requested this but supplier has not filled requesting help

## 2018-12-11 NOTE — Assessment & Plan Note (Signed)
Rate currently controled and she is maintained on anticoagullant

## 2018-12-11 NOTE — Assessment & Plan Note (Signed)
[name] is reminded of the importance of commitment to daily physical activity for 30 minutes or more, as able and the need to limit carbohydrate intake to 30 to 60 grams per meal to help with blood sugar control.   The need to take medication as prescribed, test blood sugar as directed, and to call between visits if there is a concern that blood sugar is uncontrolled is also discussed.   [name] is reminded of the importance of daily foot exam, annual eye examination, and good blood sugar, blood pressure and cholesterol control. Controlled, no change in medication   Diabetic Labs Latest Ref Rng & Units 12/07/2018 11/25/2018 11/24/2018 11/23/2018 10/13/2018  HbA1c 4.8 - 5.6 % - - - 7.0(H) -  Microalbumin Not estab mg/dL - - - - -  Micro/Creat Ratio <30 mcg/mg creat - - - - -  Chol <200 mg/dL 144 - - - -  HDL > OR = 50 mg/dL 55 - - - -  Calc LDL mg/dL (calc) 73 - - - -  Triglycerides <150 mg/dL 79 - - - -  Creatinine 0.44 - 1.00 mg/dL - 1.41(H) 1.48(H) 1.34(H) 1.44(H)  GFR >60.00 mL/min - - - - -   BP/Weight 12/07/2018 11/26/2018 11/22/2018 10/15/2018 10/13/2018 10/11/2018 58/59/2924  Systolic BP 462 863 - 817 - - -  Diastolic BP 711 657 - 903 - - -  Wt. (Lbs) 166.04 - 172 - 171.96 - 158  BMI 31.37 - 32.5 - - 32.49 29.85  Some encounter information is confidential and restricted. Go to Review Flowsheets activity to see all data.   Foot/eye exam completion dates Latest Ref Rng & Units 07/26/2018 03/16/2017  Eye Exam No Retinopathy - Retinopathy(A)  Foot exam Order - - -  Foot Form Completion - Done -

## 2018-12-11 NOTE — Telephone Encounter (Signed)
Pls  Refer for CAP services based on high fall risk with lower extremity weakness and RUE weakness/ due to recent complicated dog bite, need to see if her " partial medicaid" which she reports will cover Also IF ABLE try to see if modification of walker with ?? Elevated arm rest , I think is what she is describing can be obtained, if not let her know she will have to continue to address this with the Ortho Dr Amedeo Plenty) who she states had recommended this, he can best prescribe the specialized equipment she needs Thanks

## 2018-12-11 NOTE — Assessment & Plan Note (Signed)
Reports lower extremity weakness  with recurrent near falls and instability, unable to care for herself without assistance and requests CAP, qualifies based on her level of debility, will look into this further with her partial medicaid coverage and get back in touch

## 2018-12-11 NOTE — Assessment & Plan Note (Signed)
Hyperlipidemia:Low fat diet discussed and encouraged.   Lipid Panel  Lab Results  Component Value Date   CHOL 144 12/07/2018   HDL 55 12/07/2018   LDLCALC 73 12/07/2018   LDLDIRECT 122 (H) 01/08/2010   TRIG 79 12/07/2018   CHOLHDL 2.6 12/07/2018   Updated lab has excellent LDL of 70 and hepatic panel is normal

## 2018-12-11 NOTE — Assessment & Plan Note (Addendum)
Not well Controlled, despite medication still needs incontinence garments, marked safe mobility is a major problem.No change in medication

## 2018-12-12 ENCOUNTER — Other Ambulatory Visit: Payer: Self-pay

## 2018-12-12 ENCOUNTER — Other Ambulatory Visit: Payer: Self-pay | Admitting: Pharmacist

## 2018-12-12 DIAGNOSIS — L089 Local infection of the skin and subcutaneous tissue, unspecified: Secondary | ICD-10-CM

## 2018-12-12 DIAGNOSIS — S61259A Open bite of unspecified finger without damage to nail, initial encounter: Principal | ICD-10-CM

## 2018-12-12 NOTE — Patient Outreach (Signed)
[facility]) Care Management  12/12/2018  [name] Nov 09, 1965 [id number]   Successful outreach with [name]. Reported feeling well but still concerned that she did not receive home health services as expected. Wound care is currently being provided at Dr. Vanetta Shawl office, but she is willing to be seen in [facility] if care can be transferred. Reported taking medications as prescribed and attending appointments as scheduled. Discussed needs regarding chronic disease management. Reported that her diabetes is well controlled, and she does not require assistance managing health conditions. Expressed that her primary concern was getting help around her home. Reported that she still intends to pursue options for her sister serving as the primary caretaker. [name] was informed of pending feedback from Dr. Amedeo Plenty regarding wound care. Agreeable to follow up outreach later this week. PLAN Will complete discipline closure after plan for wound care is confirmed.  [place] [phone]

## 2018-12-12 NOTE — Telephone Encounter (Signed)
Already referred for CAP and Rena given the number to [facility] to call for update. Also sent over order for walker platform to [facility]

## 2018-12-13 NOTE — Patient Outreach (Signed)
[place] [facility]) Care Management  [place]   12/13/2018  [name] [birth year] [id number]   Successful telephonic outreach to [name] with HIPAA identifiers verified.  Patient states that she has now been prescribed to take a vitamin D supplement that she cannot afford.  Patient has a $50 quarterly OTC benefit available to purchase these items.  I will mail patient a [employer] OTC benefits catalog to order these items as needed.  Successful incoming call from Dr. Maryanna Shape office Evelena Peat RN) stating that patient is okay to restart Plavix at this time if PCP and Hospitalist is okay with this.  Plavix RX continued by [facility].  Will reach out to PCP to make her aware  PLAN: - I will close this pharmacy case as goals have been achieved.  Encouraged patient to reach out to [facility] Pharmacist if additional needs arise.   Regina Eck, PharmD, [place]  [phone]

## 2018-12-13 NOTE — Patient Outreach (Addendum)
[facility]) Care Management  [place]   12/08/2018  ALSACE [name] 1966-01-05 [id number]  Reason for referral: Medication Management  Referral source: [place]  PMHx includes but not limited to:  HTN, Afib, T2DM, GERD, HLD, COPD  Outreach:  Successful telephone call with [name].  HIPAA identifiers verified.  Patient states she does not need pharmacy services at this time.   She reports 100% compliance with medications and does not wish to review them at this time.  She continues to receive medications via [facility]/compliance packaging, which has been great for her as she is on multiple medications taken at different times per day.  Medications were reviewed by [facility] PharmD via Sheliah Plane and MD notes.  Patient did state that she has continued to take Plavix and that her compliance packaging medications have not changed since discharge from ortho admission.  [facility] PharmD to investigate.  Patient denies S/Sx of bleeding.  She states she needs help in the home with ADLs and cleaning.    Objective: Lab Results  Component Value Date   CREATININE 1.41 (H) 11/25/2018   CREATININE 1.48 (H) 11/24/2018   CREATININE 1.34 (H) 11/23/2018    Lab Results  Component Value Date   HGBA1C 7.0 (H) 11/23/2018    Lipid Panel     Component Value Date/Time   CHOL 144 12/07/2018 1543   TRIG 79 12/07/2018 1543   HDL 55 12/07/2018 1543   CHOLHDL 2.6 12/07/2018 1543   VLDL 17 06/24/2017 2147   LDLCALC 73 12/07/2018 1543   LDLDIRECT 122 (H) 01/08/2010 0818    BP Readings from Last 3 Encounters:  12/07/18 (!) 162/108  11/26/18 (!) 143/117  10/15/18 (!) 142/101    No Known Allergies  Medications Reviewed Today    Reviewed by Lavera Guise, [place] (Pharmacist) on 12/13/18 at 1058  Med List Status: <None>  Medication Order Taking? Sig Documenting Provider Last Dose Status Informant  albuterol (PROAIR HFA) 108 (90 Base) MCG/ACT inhaler  [id number] Yes INHALE 2 PUFFS EVERY 6 HOURS AS NEEDED FOR SHORTNESS OF BREATH/WHEEZING.  Patient taking differently:  Inhale 2 puffs into the lungs every 6 (six) hours as needed for wheezing or shortness of breath.    [name], MD Taking Active Self  amiodarone (PACERONE) 200 MG tablet [id number] Yes Take 1 tablet (200 mg total) by mouth 2 (two) times daily. Oswald Hillock, MD Taking Active Self           Med Note Lynnae Prude Jul 11, 2018  4:36 PM)    amLODipine (NORVASC) 10 MG tablet [id number] Yes Take 1 tablet (10 mg total) by mouth daily. [name], MD Taking Active Self        Discontinued 12/13/18 1057 (Completed Course)   apixaban (ELIQUIS) 5 MG TABS tablet [id number] Yes Take 1 tablet (5 mg total) by mouth 2 (two) times daily for 30 days. [name]., MD Taking Active   aspirin EC 81 MG tablet [id number] Yes Take 1 tablet (81 mg total) by mouth daily. Barton Dubois, MD Taking Active Self  cloNIDine (CATAPRES) 0.3 MG tablet [id number] Yes One tablet at bedtime for blood pressure [name], MD Taking Active   DULoxetine (CYMBALTA) 30 MG capsule [id number] Yes Take 60 mg by mouth daily.  [provider] Taking Active Self  ezetimibe (ZETIA) 10 MG tablet [id number] Yes TAKE 1 TABLET BY MOUTH ONCE A DAY.  Patient taking differently:  Take 10 mg by mouth daily.    [name], MD Taking Active Self  furosemide (LASIX) 20 MG tablet [id number] Yes TAKE 1 TABLET BY MOUTH ONCE A DAY.  Patient taking differently:  Take 40 mg by mouth daily.    [name], MD Taking Active Self  gabapentin (NEURONTIN) 300 MG capsule [id number] Yes Take 1 capsule (300 mg total) by mouth 3 (three) times daily. 3 times a day when necessary neuropathy pain  Patient taking differently:  Take 300 mg by mouth 3 (three) times daily.    Newt Minion, MD Taking Active Self  HYDROcodone-acetaminophen [facility]) 5-325 MG tablet [id number] Yes Take 1 tablet by mouth  every 6 (six) hours as needed for moderate pain. [name], MD Taking Active   hydrOXYzine (ATARAX/VISTARIL) 25 MG tablet [id number] Yes Take one tablet at bedtime for itching and for sleep [name], MD Taking Active   ipratropium-albuterol (DUONEB) 0.5-2.5 (3) MG/3ML SOLN [id number] Yes Take 3 mLs by nebulization every 6 (six) hours as needed (wheezes or SOB). [name], MD Taking Active Self  linagliptin (TRADJENTA) 5 MG TABS tablet [id number] Yes Take 1 tablet (5 mg total) by mouth daily. [name], MD Taking Active   metoprolol succinate (TOPROL-XL) 50 MG 24 hr tablet [id number] Yes Take 1 tablet (50 mg total) by mouth 2 (two) times daily. Take with or immediately following a meal. Barton Dubois, MD Taking Active Self  montelukast (SINGULAIR) 10 MG tablet [id number] Yes TAKE (1) TABLET BY MOUTH AT BEDTIME.  Patient taking differently:  Take 10 mg by mouth at bedtime.    [name], MD Taking Active Self  olmesartan [facility]) 40 MG tablet [id number] Yes Take 1 tablet (40 mg total) by mouth daily. [name], MD Taking Active   pantoprazole (PROTONIX) 40 MG tablet [id number] Yes TAKE 1 TABLET BY MOUTH ONCE A DAY.  Patient taking differently:  Take 40 mg by mouth daily.    [name], MD Taking Active Self  potassium chloride SA (K-DUR,KLOR-CON) 20 MEQ tablet [id number] Yes TAKE 1&1/2 TABLET BY MOUTH DAILY.  Patient taking differently:  Take 30 mEq by mouth daily.    [name], MD Taking Active Self  rosuvastatin (CRESTOR) 10 MG tablet [id number] Yes TAKE 1 TABLET BY MOUTH ONCE A DAY.  Patient taking differently:  Take 10 mg by mouth every evening.    [name], MD Taking Active Self  solifenacin (VESICARE) 5 MG tablet [id number] Yes Take 1 tablet (5 mg total) by mouth daily. [name], MD Taking Active   tiotropium [facility] HANDIHALER) 18 MCG inhalation capsule [id number] Yes Place 18 mcg into inhaler and inhale  daily as needed (SHORTNESS OF BREATH).  [provider] Taking Active Self           Med Note Lynnae Prude Jul 11, 2018  4:35 PM)    traZODone (DESYREL) 50 MG tablet [id number] Yes Take 50 mg by mouth at bedtime. [provider] Taking Active Self          Assessment:  Drugs sorted by system:  Neurologic/Psychologic: duloxetine, gabapentin, hydroxyzine, trazodone  Cardiovascular: apixaban, clopidogrel, ASA, amiodarone, clonidine, amlodipine, olmesartan, metoprolol, ezetimibe, furosemide, rosuvastatin  Pulmonary/Allergy: albuterol, montelukast, tiotropium  Gastrointestinal: pantoprazole  Endocrine: linagliptin  Pain: hydrocodone/APAP  Genitourinary: solifenacin  Vitamins/Minerals/Supplements: potassium  Medication Review Findings:  . Patient still taking Plavix as no d/c order was sent to  [facility] upon discharge from the hospital.  Will confirm with Dr. Maryanna Shape office about Plavix (d/c vs okay to continue) . Call placed to [place] that packs Ms. Whisner medications.  New medications have already been updated from latest PCP visit.   Plan: - I will follow up once Ortho has responded about Plavix  - I will follow up with the patient once information is received.   Regina Eck, PharmD, [place]  [phone]

## 2018-12-14 ENCOUNTER — Other Ambulatory Visit: Payer: Medicare Other

## 2018-12-14 ENCOUNTER — Other Ambulatory Visit: Payer: Self-pay

## 2018-12-14 NOTE — Patient Outreach (Signed)
[facility] [facility]) Care Management  12/14/2018  ANESHA HACKERT 1965/12/20 [id number]   Discussion with Dr. Amedeo Plenty regarding plan for wound care. He expressed concerns regarding Ms. Wamble not receiving anticipated home health services and prefers not to transfer her wound care at this time. Reported that Ms. Udovich has a pending follow up on 12/15/18 and he would be available from 9am-12pm. He will determine the need for follow up visits after evaluating her wound. RNCM contacted Ms. Matchett regarding plan and pending appointment. Ms. Swim indicated that she did not have transportation. Call placed to [facility] regarding options for assistance. PLAN Will follow up with [place] Manager [phone]

## 2018-12-14 NOTE — Patient Outreach (Signed)
[facility]) Care Management  12/14/2018  TOMASA DOBRANSKY 03/28/1966 [id number]   Incoming call from [facility], Neldon [name], reporting the Dr. Amedeo Plenty would like to see patient tomorrow between 9:00 and 12:00 for wound care but patient does not have transportation.   [facility] was unable to assist due to not having enough notice of appointment and Ms. Abramovich has exhausted her rides through [employer].  Per [name], Dr. Amedeo Plenty is not comfortable with transitioning Ms. Vanstone's wound care to another provider at this time. BSW called Ms. Weinert to inform her that we are unable to assist with transportation tomorrow.  Ms. Tanksley because upset during the call and call was disconnected.  BSW will staff case with Surveyor, quantity of Care Management, Bary Castilla, tomorrow and call Ms. Boran again regarding transportation.  Ronn Melena, BSW Social Worker [phone]

## 2018-12-15 ENCOUNTER — Other Ambulatory Visit: Payer: Self-pay

## 2018-12-15 DIAGNOSIS — M79641 Pain in right hand: Secondary | ICD-10-CM | POA: Diagnosis not present

## 2018-12-15 DIAGNOSIS — S61451A Open bite of right hand, initial encounter: Secondary | ICD-10-CM

## 2018-12-15 DIAGNOSIS — W540XXA Bitten by dog, initial encounter: Principal | ICD-10-CM

## 2018-12-15 NOTE — Patient Outreach (Signed)
[place] [facility]) Care Management  12/15/2018  [name] Oct 28, 1965 [id number]  [name] is scheduled to see Dr. Amedeo Plenty today at 12:00PM.  When talking with her yesterday, she reported that she did not have transportation to this appointment.  BSW staffed case with Assistant Clinical Director of Care Management, Bary Castilla, due to patient's ongoing transportation needs.  It was decided that [facility] could provide transportation to one more appointment with Dr. Amedeo Plenty but will be unable to provide additional rides. BSW called [name] today to inform her of this and she reported that her brother will be taking her to the appointment.  BSW informed [name] that [facility] has exhausted transportation services.  Dr. Amedeo Plenty will be informed of this as well. BSW closing case at this time.  Ronn Melena, BSW Social Worker [phone]

## 2018-12-15 NOTE — Patient Outreach (Signed)
[place] [facility]) Care Management  12/15/2018  HAYDYN LIDDELL 03/22/1966 [id number]    Case Closure -Follow up discussion with [facility] SW and Dr. Amedeo Plenty. -Letter faxed to Dr. Moshe Cipro. -Case Closure complete.    [place] [phone]

## 2018-12-19 ENCOUNTER — Ambulatory Visit (INDEPENDENT_AMBULATORY_CARE_PROVIDER_SITE_OTHER): Payer: Self-pay | Admitting: Orthopedic Surgery

## 2018-12-19 ENCOUNTER — Encounter (INDEPENDENT_AMBULATORY_CARE_PROVIDER_SITE_OTHER): Payer: Self-pay | Admitting: Orthopedic Surgery

## 2018-12-19 ENCOUNTER — Ambulatory Visit (INDEPENDENT_AMBULATORY_CARE_PROVIDER_SITE_OTHER): Payer: Medicare Other | Admitting: Physician Assistant

## 2018-12-19 ENCOUNTER — Ambulatory Visit (INDEPENDENT_AMBULATORY_CARE_PROVIDER_SITE_OTHER): Payer: Medicare Other

## 2018-12-19 VITALS — Ht 61.0 in | Wt 166.0 lb

## 2018-12-19 DIAGNOSIS — M25562 Pain in left knee: Secondary | ICD-10-CM | POA: Diagnosis not present

## 2018-12-19 DIAGNOSIS — S80212A Abrasion, left knee, initial encounter: Secondary | ICD-10-CM

## 2018-12-19 DIAGNOSIS — R6889 Other general symptoms and signs: Secondary | ICD-10-CM | POA: Diagnosis not present

## 2018-12-19 MED ORDER — MUPIROCIN 2 % EX OINT: 1.0000 "application " | TOPICAL_OINTMENT | Freq: Every day | CUTANEOUS | 0 refills | Status: DC
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
## 2018-12-21 ENCOUNTER — Telehealth: Payer: Self-pay | Admitting: Family Medicine

## 2018-12-21 DIAGNOSIS — Z87828 Personal history of other (healed) physical injury and trauma: Secondary | ICD-10-CM

## 2018-12-21 NOTE — Telephone Encounter (Signed)
I understand they are telling her at the insurance company that she is eligible and a referral form has already been faxed to [facility] for a home health aide and I have even given Valynn the number to reach out to them and check the status.

## 2018-12-21 NOTE — Telephone Encounter (Signed)
I tried to call her- no answer. She has already been referred to [facility] and I will refax the form.  The only other way to get an aide is to already be receiving home health nursing or physical therapy. Once they are receiving services- the home health agency can provide aide services. She can let us know when/if she is under home health services and this will be arranged through them.

## 2018-12-21 NOTE — Telephone Encounter (Signed)
Pt is calling regarding Home Health, Her sister called the insurance and she is eligible for it, please reach out if any questions.

## 2018-12-22 ENCOUNTER — Telehealth: Payer: Self-pay | Admitting: Family Medicine

## 2018-12-22 NOTE — Addendum Note (Signed)
Addended by: Eual Fines on: 12/22/2018 05:12 PM   Modules accepted: Orders

## 2018-12-22 NOTE — Telephone Encounter (Signed)
Pt needs someone for Wound Care, Her bandage should be changed everyday, it hasnt been changed in three days,  Can we refer to Wound Care she can get some one to take her, if no one will come to the house.  I think she is confused with what she is asking, as one minute she needs wound care, and then she wants an aide to help around the house.   I advised that I would send a note for a referral to Wound Care .

## 2018-12-22 NOTE — Telephone Encounter (Signed)
Pt is calling in stating that her insurance is changing to cover everything on 3-1

## 2018-12-22 NOTE — Telephone Encounter (Signed)
Aware we can try to re refer [facility] to [facility] then

## 2018-12-22 NOTE — Telephone Encounter (Signed)
Yes please arrange for wound care for her hand as soon as possible wherever is most convenient for her/ likely to be effective

## 2018-12-22 NOTE — Telephone Encounter (Signed)
Is a referral for home health wound care ok or would you want her to be seen at facility?

## 2018-12-22 NOTE — Telephone Encounter (Signed)
PT lvm for someone to call her regarding Home Health nurse

## 2018-12-23 NOTE — Addendum Note (Signed)
Addended by: Eual Fines on: 12/23/2018 08:14 AM   Modules accepted: Orders

## 2018-12-26 ENCOUNTER — Telehealth: Payer: Self-pay | Admitting: Family Medicine

## 2018-12-26 ENCOUNTER — Telehealth: Payer: Self-pay

## 2018-12-26 DIAGNOSIS — I6330 Cerebral infarction due to thrombosis of unspecified cerebral artery: Secondary | ICD-10-CM

## 2018-12-26 DIAGNOSIS — I5042 Chronic combined systolic (congestive) and diastolic (congestive) heart failure: Secondary | ICD-10-CM

## 2018-12-26 NOTE — Telephone Encounter (Signed)
Called [name] with [facility] coordinator and she stated that [facility] was taking over [name] case to provide care, but that this would not start until this first week of March. She also states that Dr. Amedeo Plenty was more than willing to handle writing the orders needed for this wound care. [name] also reports that Ms. [name] is aware of all of this.

## 2018-12-26 NOTE — Telephone Encounter (Signed)
Phone message generated for this message

## 2018-12-26 NOTE — Telephone Encounter (Signed)
[facility] called and LMOM that she has not had wound care and she needs it asap.  She said she called the "wound care people" and they did not know anything about it.  Please call asap. Sent to [facility] as Urgent mail Monday am.  I will also ask her to follow up asap.

## 2018-12-28 ENCOUNTER — Telehealth: Payer: Self-pay | Admitting: Family Medicine

## 2018-12-28 NOTE — Telephone Encounter (Signed)
Please send in the forms to [facility] -for assistance

## 2018-12-29 NOTE — Addendum Note (Signed)
Addended by: Eual Fines on: 12/29/2018 03:46 PM   Modules accepted: Orders

## 2018-12-29 NOTE — Telephone Encounter (Signed)
Will send once dr simpson signs

## 2018-12-30 NOTE — Addendum Note (Signed)
Addended by: Eual Fines on: 12/30/2018 10:11 AM   Modules accepted: Orders

## 2019-01-04 ENCOUNTER — Telehealth: Payer: Self-pay | Admitting: *Deleted

## 2019-01-04 NOTE — Telephone Encounter (Signed)
Pt called in wondering about home health. Gave her the recommendation of [facility] [id number] as it was recommended for [name].

## 2019-01-10 ENCOUNTER — Other Ambulatory Visit: Payer: Self-pay | Admitting: Family Medicine

## 2019-01-10 DIAGNOSIS — I10 Essential (primary) hypertension: Secondary | ICD-10-CM

## 2019-01-11 DIAGNOSIS — I509 Heart failure, unspecified: Secondary | ICD-10-CM | POA: Diagnosis not present

## 2019-01-11 DIAGNOSIS — R062 Wheezing: Secondary | ICD-10-CM | POA: Diagnosis not present

## 2019-01-12 IMAGING — DX DG LUMBAR SPINE COMPLETE 4+V
5 series · 5 of 5 positions shown · non-contrast
Comparison: 05/09/2015 and prior studies

CLINICAL DATA: Acute low back pain for 3 days.

EXAM:
LUMBAR SPINE - COMPLETE 4+ VIEW

[l-spine ap]
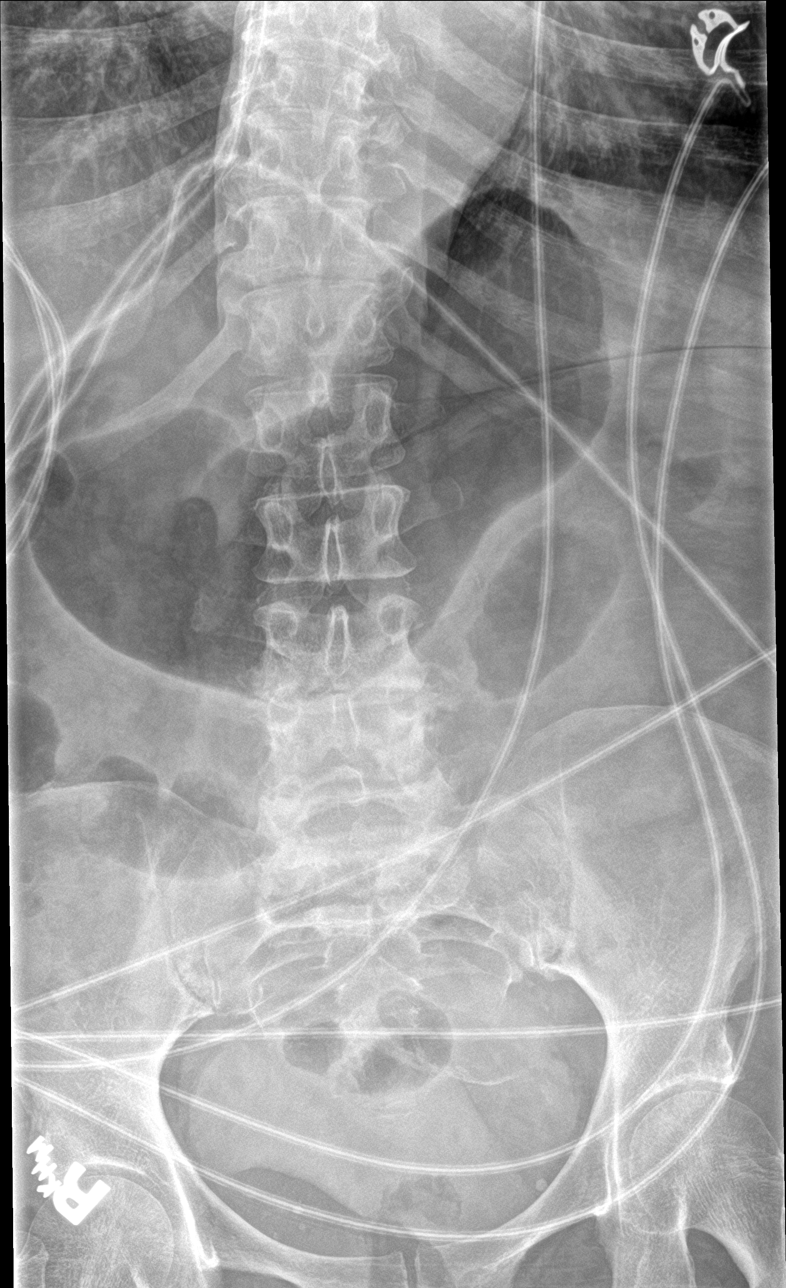

[l-spine obl (1 of 2)]
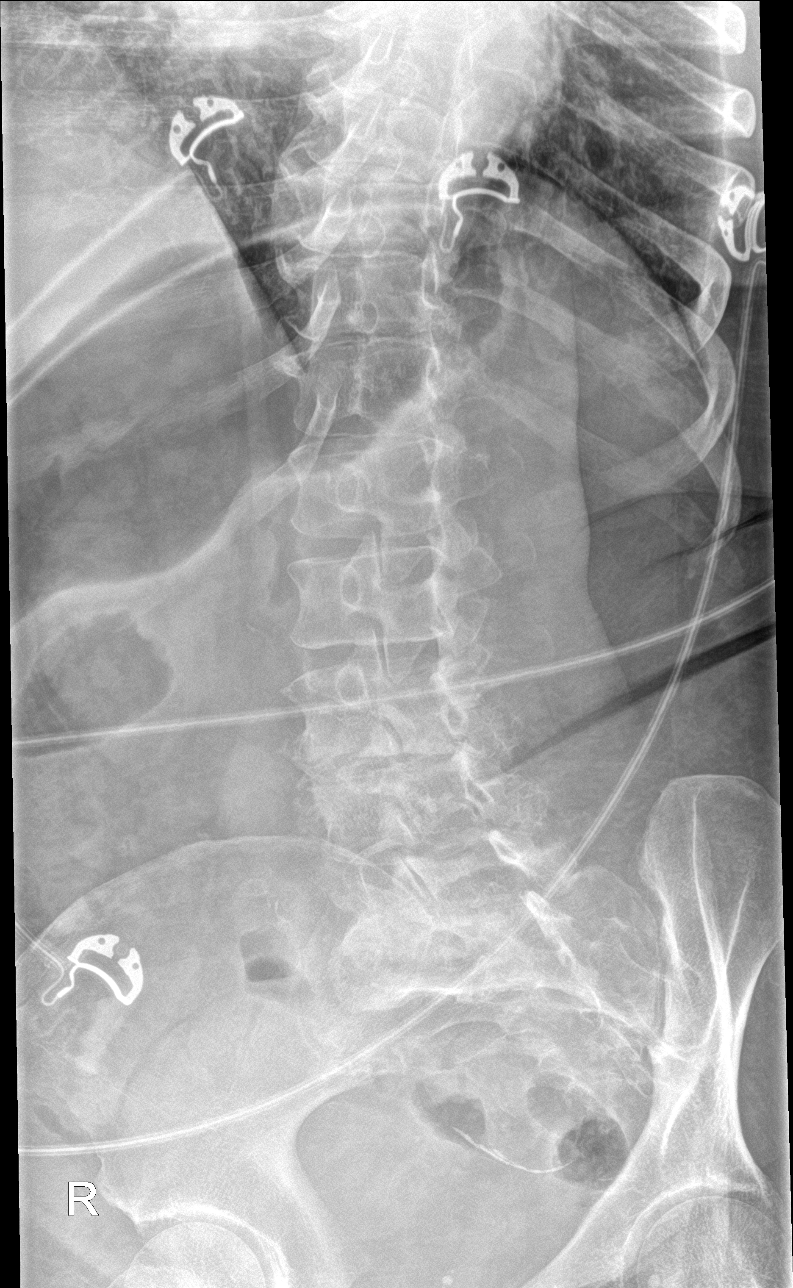

[l-spine obl (2 of 2)]
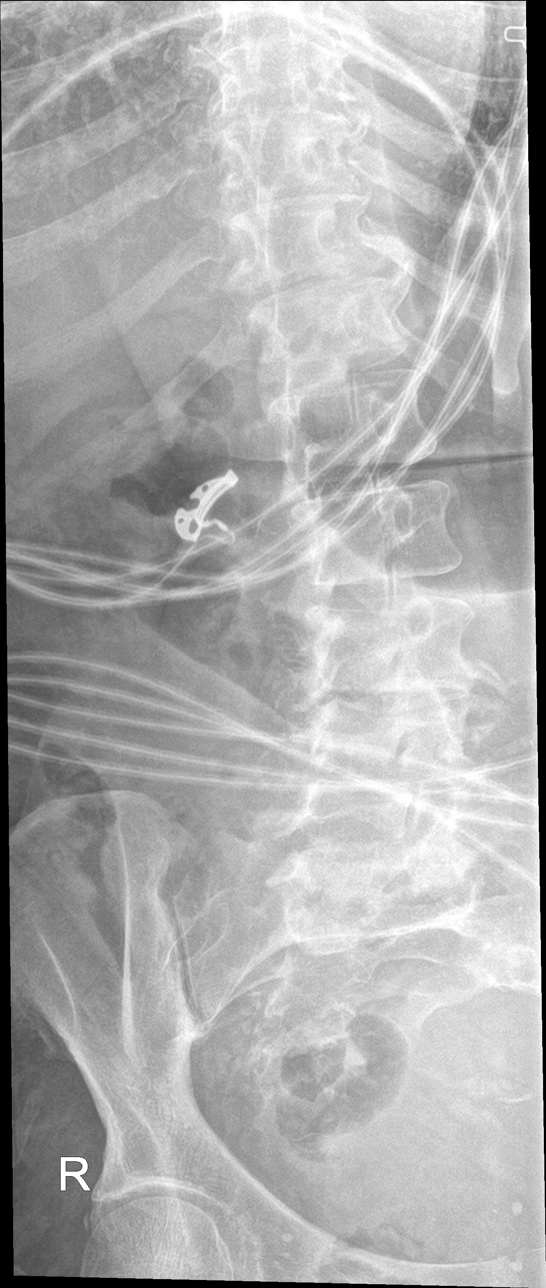

[l-spine lat]
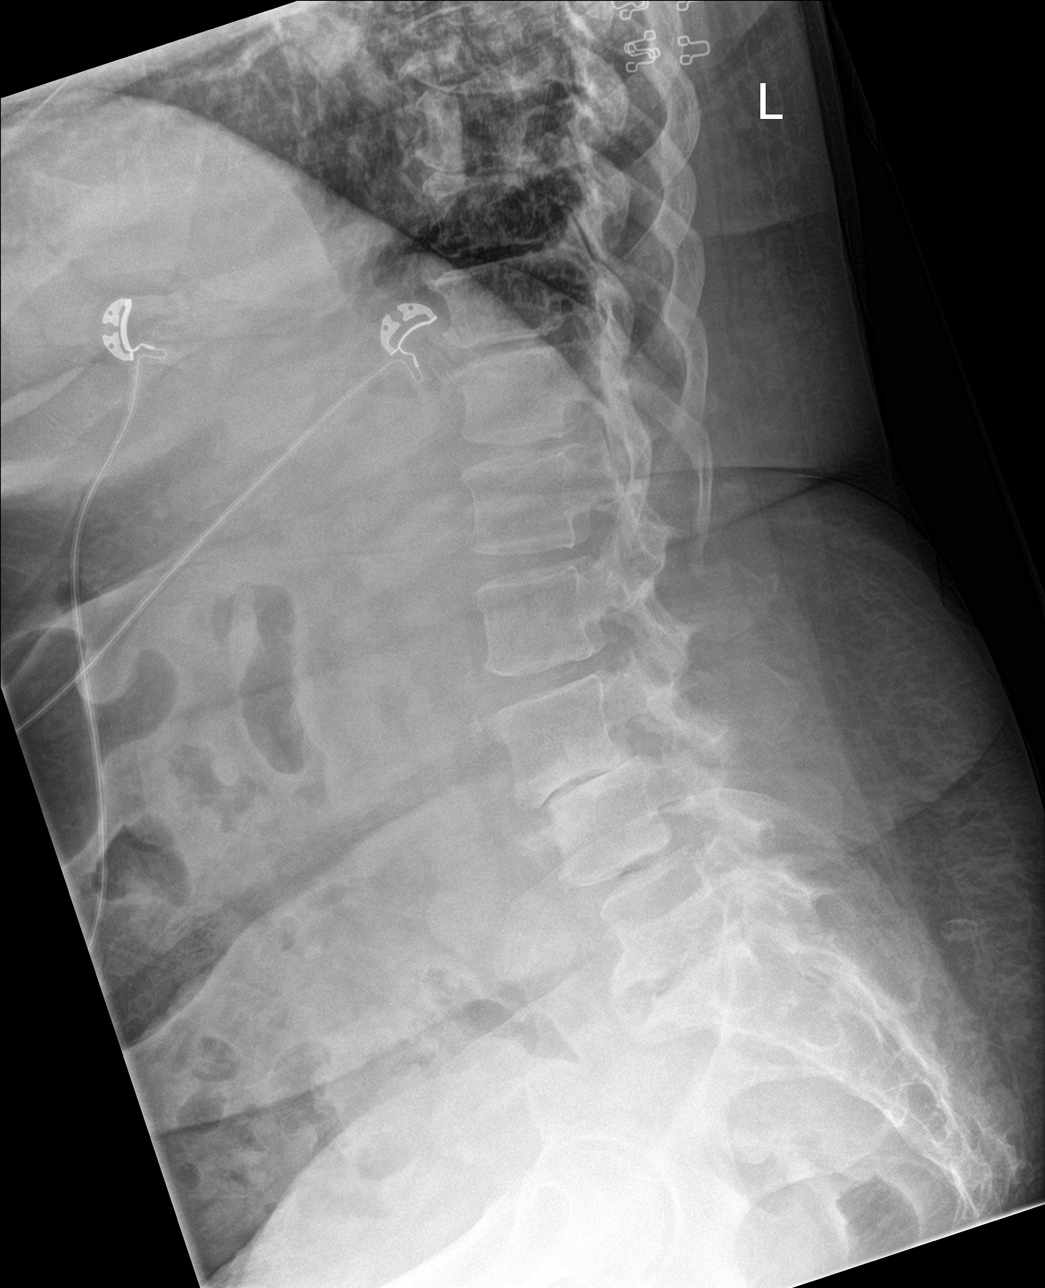

[l-spine spot]
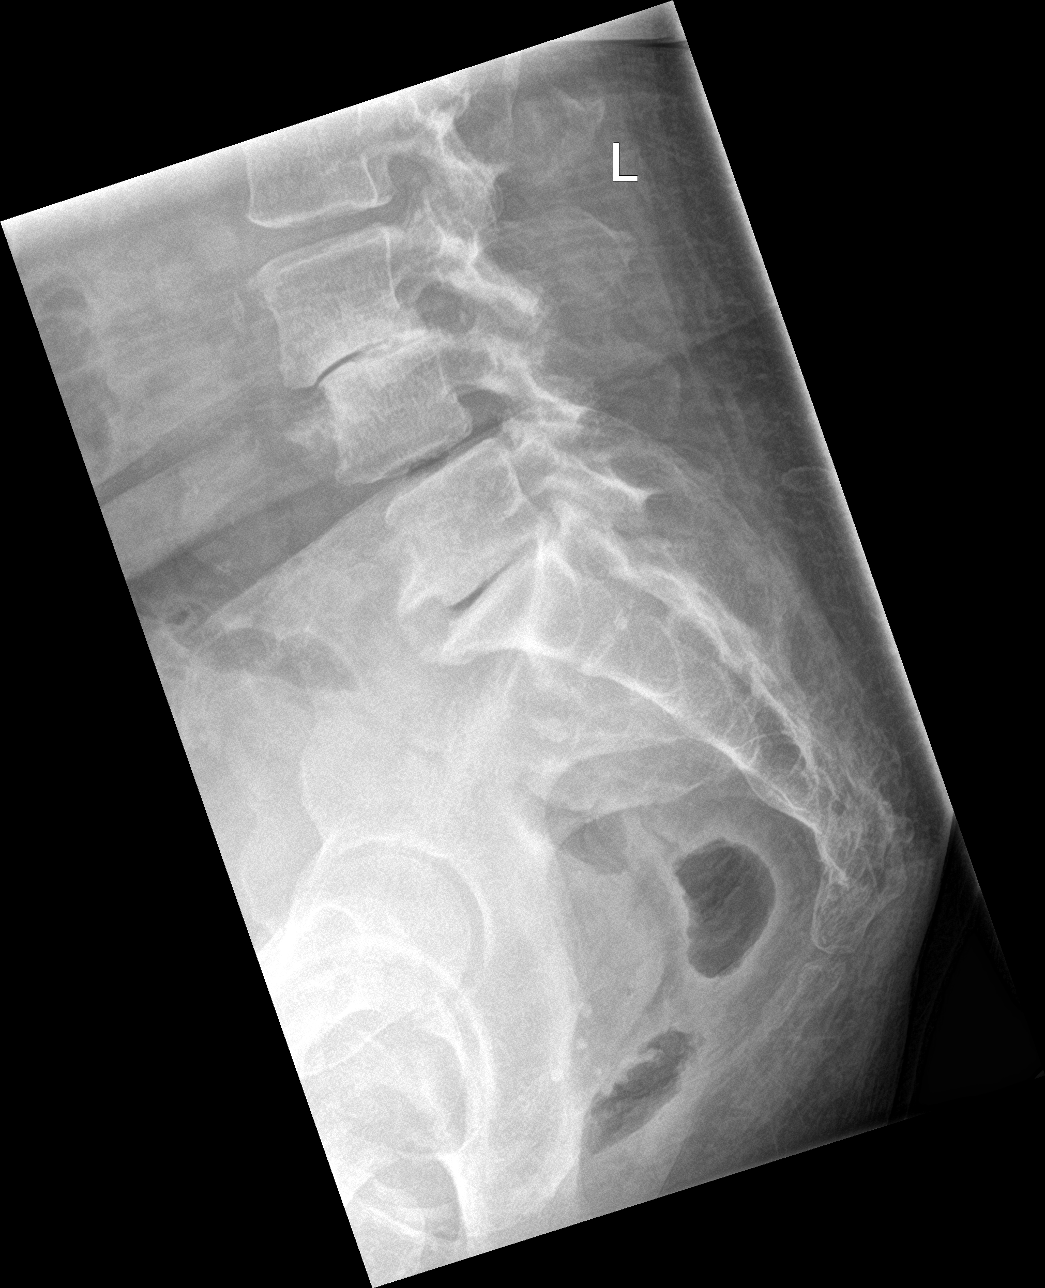

[5 of 5 positions shown; findings below may reference images not displayed]

FINDINGS: No acute fracture noted.

Increased severe disc space loss at L3-4 noted with 6 mm
anterolisthesis of L3 on L4, new since 05/09/2015.

Moderate to severe degenerative disc disease and spondylosis at L4-5
and L5-S1 again identified.

Mild multilevel degenerative disc disease otherwise noted.

No suspicious focal bony lesion or spondylolysis identified.
IMPRESSION: 1. Increased severe degenerative disc disease at L3-4 with new grade
1 anterolisthesis at this level.
2. Moderate to severe degenerative changes at L4-5 and L5-S1 again
identified.

## 2019-01-12 IMAGING — MR MR LUMBAR SPINE W/O CM
4 of 5 series · 12 of 48 positions shown · non-contrast
Comparison: Lumbar spine radiographs June 21, 2017 at 7757 hours
and MRI of lumbar spine May 09, 2015

CLINICAL DATA: Weakness for 1 week. History of diabetes. Assess for
cauda equina syndrome.

EXAM:
MRI LUMBAR SPINE WITHOUT CONTRAST
TECHNIQUE: Multiplanar, multisequence MR imaging of the lumbar spine was
performed. No intravenous contrast was administered.

[Series 3: T2 · sagittal · 4.0mm · 0.40mm/px · 3 of 13 slices shown (1 of 3)]
[im 1/13]
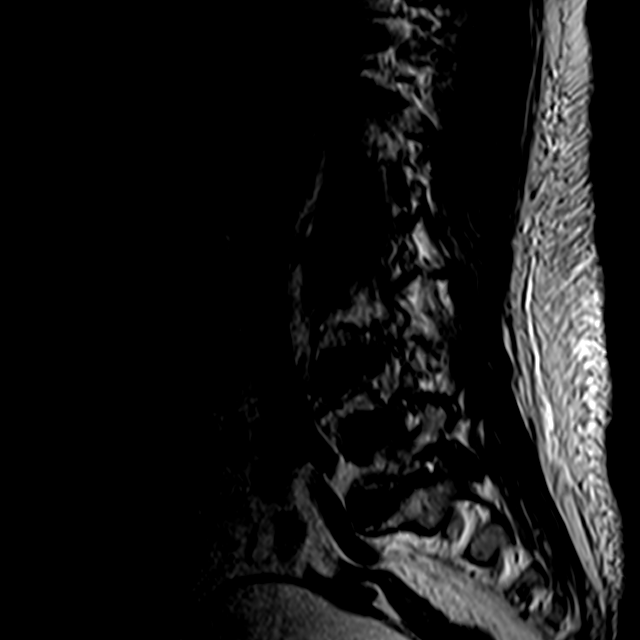
[im 7/13]
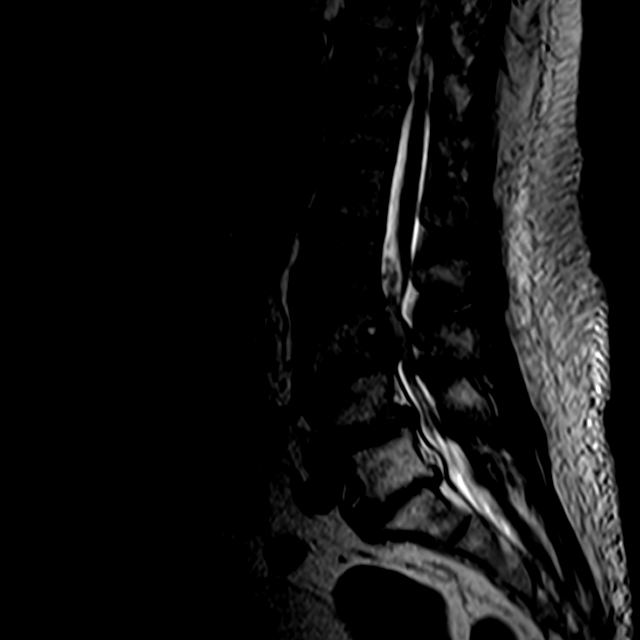
[im 13/13]
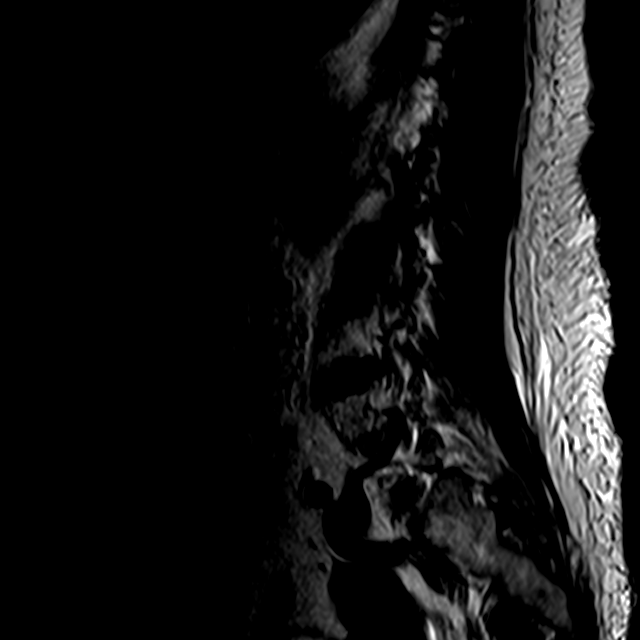

[Series 4: T1 · sagittal · 4.0mm · 0.40mm/px · 3 of 13 slices shown]
[im 1/13]
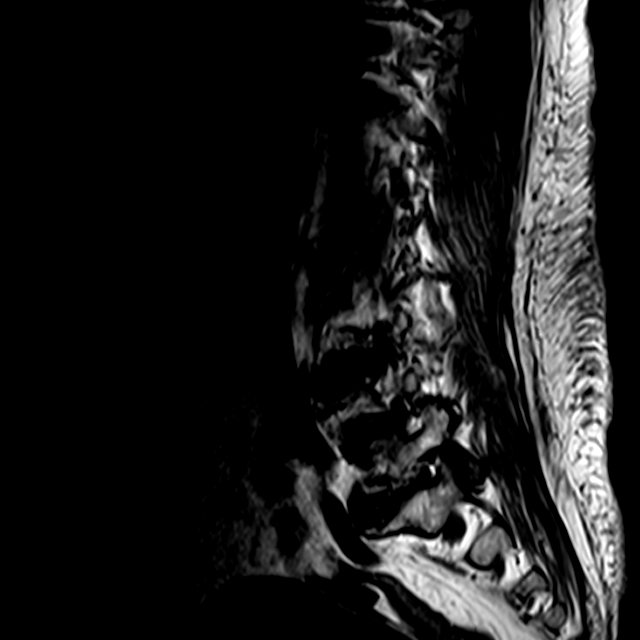
[im 7/13]
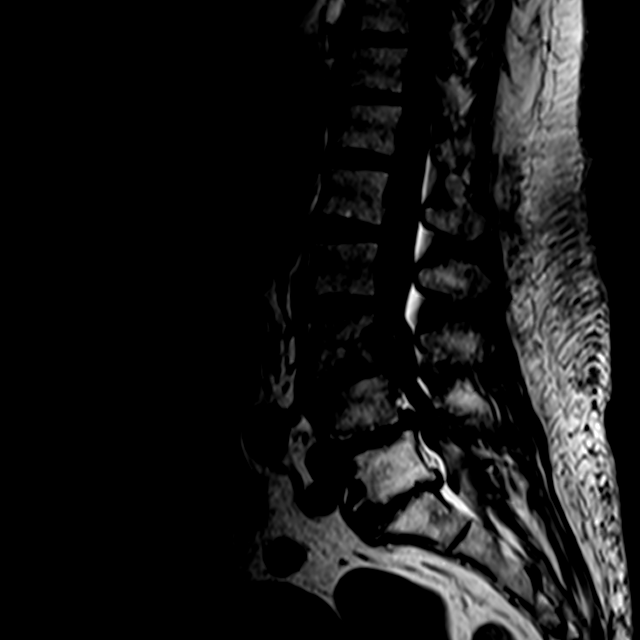
[im 13/13]
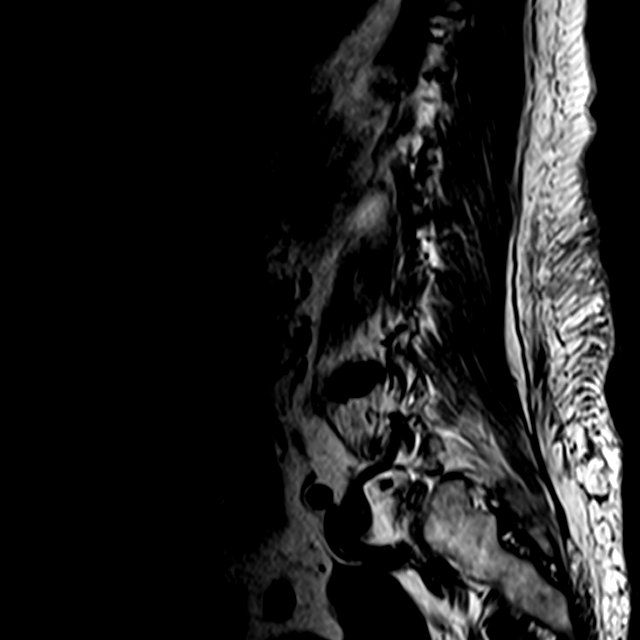

[Series 5: T2 · sagittal · 4.0mm · 0.40mm/px · 3 of 16 slices shown (2 of 3)]
[im 4/16]
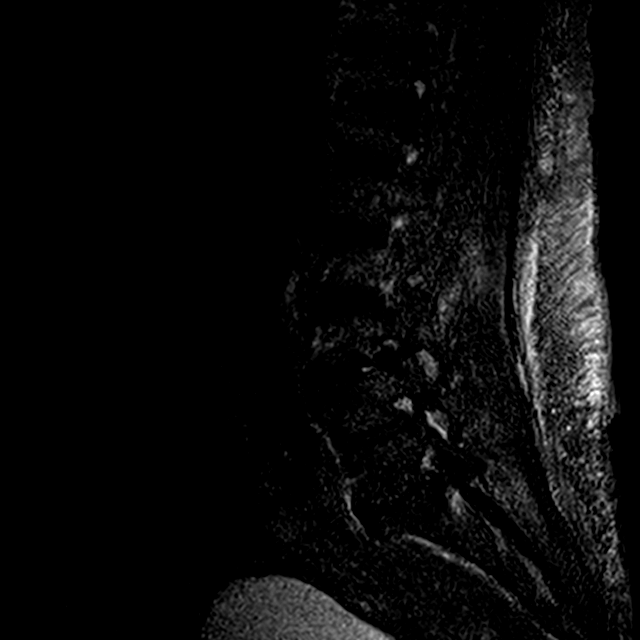
[im 10/16]
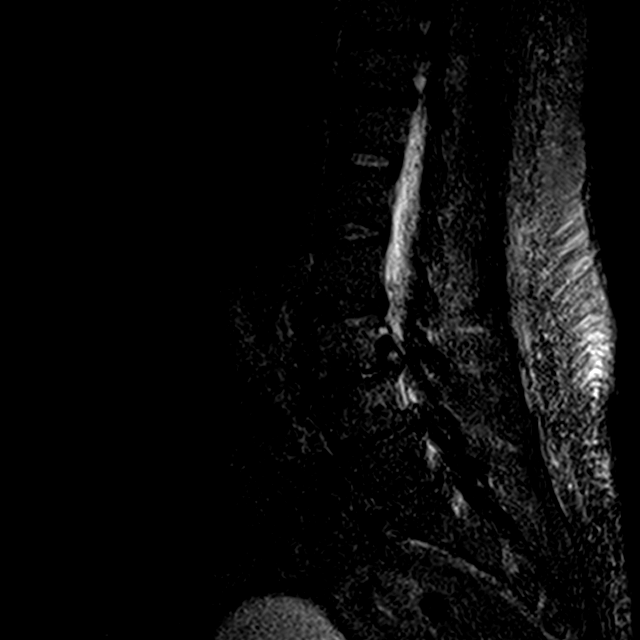
[im 16/16]
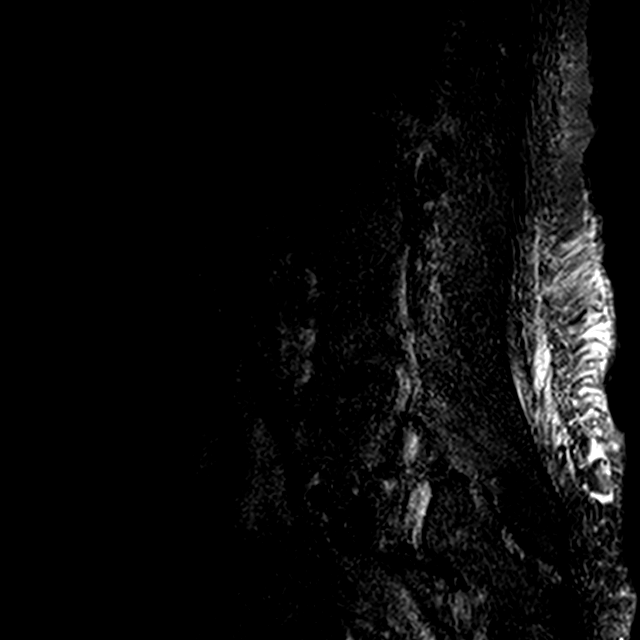

[Series 6: T2 · axial · 4.0mm · 0.26mm/px · z∈[-111,+69]mm · 3 of 45 slices shown (3 of 3)]
[im 6/45]
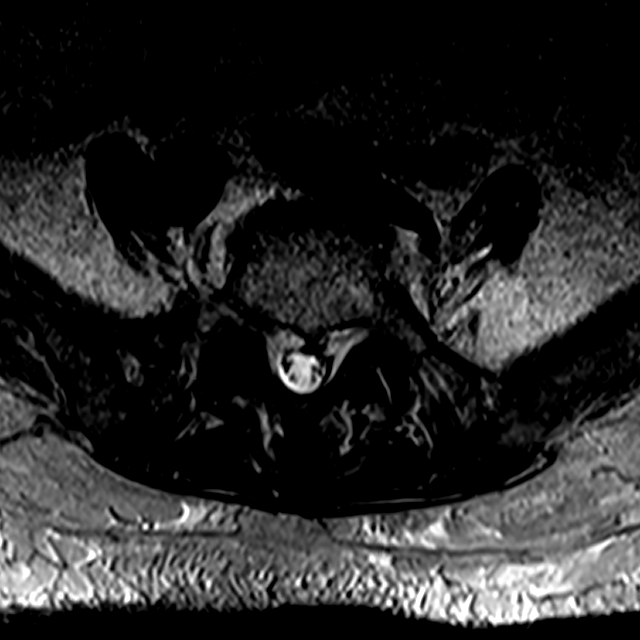
[im 24/45]
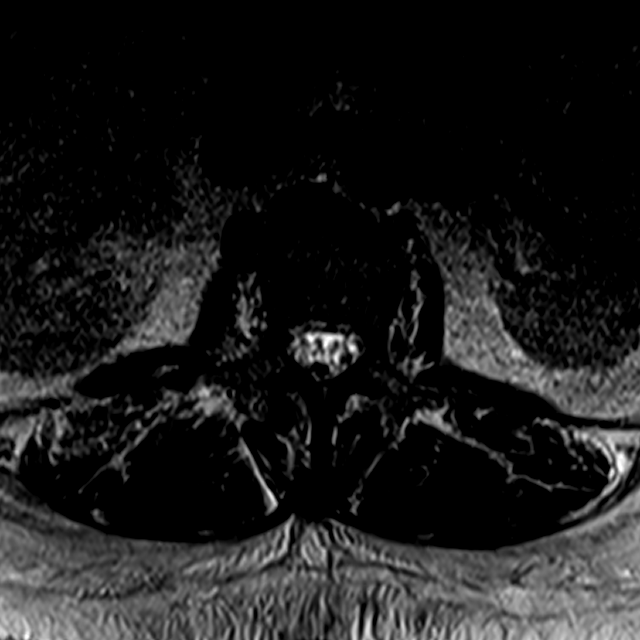
[im 39/45]
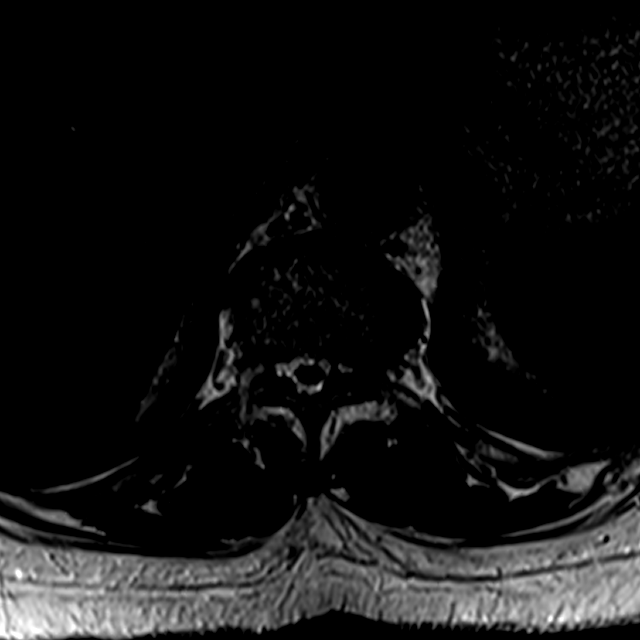

[12 of 48 positions shown; findings below may reference images not displayed]

FINDINGS: SEGMENTATION: For the purposes of this report, the last well-formed
intervertebral disc will be reported as L5-S1.

ALIGNMENT: Maintained lumbar lordosis. Grade 1 L3-4 anterolisthesis.
Minimal grade 1 L4-5 anterolisthesis. Minimal grade 1 L5-S1
retrolisthesis. No spondylolysis.

VERTEBRAE:Vertebral bodies are intact. Severe L3-4 and L5-S1 disc
height loss progressed from prior examination. Moderate L4-5 disc
height loss with disc desiccation all levels compatible with
degenerative discs. Focal edema L3-4 disc. Moderate to severe
subacute or chronic discogenic endplate changes L5-S1. Mild L3-4 and
L4-5 acute on chronic discogenic endplate changes with trace acute
component L3-4. Scalloping posterior L3 vertebral body associated
with low T1 and bright T2 signal within Batson's plexus. Minimal
bone marrow edema L3-4 facet partially volumes with effusion.

CONUS MEDULLARIS: Conus medullaris terminates at L1-2 and
demonstrates normal morphology and signal characteristics. Central
displacement of the cauda equina with disorganized appearance.

PARASPINAL AND SOFT TISSUES: Included prevertebral and paraspinal
soft tissues are nonacute. Multiple small low signal masses within
the uterus compatible with leiomyoma. Trace L3-4 interspinous fluid
signal.

DISC LEVELS:

T11-12: Small broad-based disc bulge, mild facet arthropathy and
ligamentum flavum redundancy. Mild canal stenosis without neural
foraminal narrowing.

T12-L1, L1-2: No disc bulge, canal stenosis nor neural foraminal
narrowing.

L2-3: Small broad-based disc bulge. Minimal facet arthropathy
without canal stenosis. No neural foraminal narrowing.

L3-4: Anterolisthesis. Large broad-based disc bulge with
superimposed LEFT central 5 x 6 mm disc extrusion, 12 mm superior
migration. Moderate facet arthropathy and ligamentum flavum
redundancy with trace facet effusions. Severe canal stenosis. Severe
bilateral neural foraminal narrowing and, encroaching upon the
exited L 3 nerve. Lateral recess narrowing may affect the traversing
L4 nerves.

L4-5: Anterolisthesis. Moderate broad-based disc bulge. Moderate to
severe facet arthropathy and ligamentum flavum redundancy with trace
facet effusions which are likely reactive. Mild canal stenosis
including partially effaced LEFT and RIGHT lateral recesses which
may affect the traversing L5 nerves. Moderate to severe RIGHT,
severe LEFT neural foraminal narrowing. Encroachment upon at LEFT
greater than RIGHT exited L4 nerve.

L5-S1: Small to moderate broad-based disc bulge asymmetric to the
RIGHT. Mild facet arthropathy. No canal stenosis. Severe bilateral
neural foraminal narrowing.
IMPRESSION: 1. Grade 1 L3-4 and L4-5 anterolisthesis. Grade 1 L5-S1
retrolisthesis. No spondylolysis.
2. Progressed degenerative change of lumbar spine. Superimposed 5 x
6 x 12 mm L3-4 disc extrusion.
3. Severe canal stenosis L3-4 with resultant disorganized cauda
equina. Mild canal stenosis L4-5.
4. Severe neural foraminal narrowing L3-4 through L5-S1.
5. L3-4 inflammatory facet arthropathy.
6. Progressive scalloping of L3 posterior vertebral body suggesting
chronic venous changes. Findings would be better characterized on
contrast-enhanced images.

## 2019-01-13 IMAGING — CT CT HEAD W/O CM
3 series · 15 of 47 positions shown, 18 images · non-contrast
Comparison: 04/03/2015

CLINICAL DATA: Left lower extremity weakness beginning yesterday.

EXAM:
CT HEAD WITHOUT CONTRAST
TECHNIQUE: Contiguous axial images were obtained from the base of the skull
through the vertex without intravenous contrast.

[Series 2: head trauma wo · axial · 0.44mm/px · z∈[+41,+166]mm · 9 of 30 slices shown, 12 images]
[im 3/30  brain]
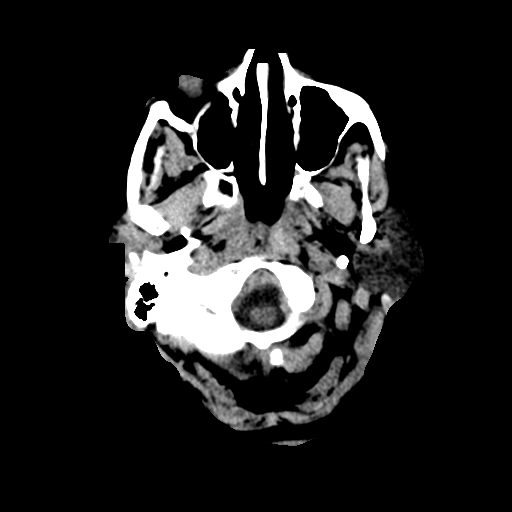
[im 3/30  bone]
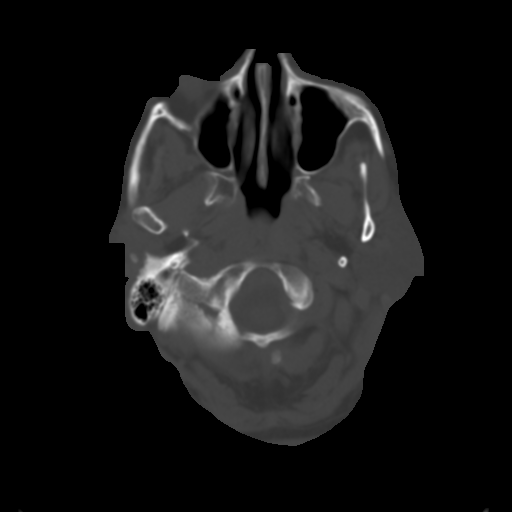
[im 6/30  brain]
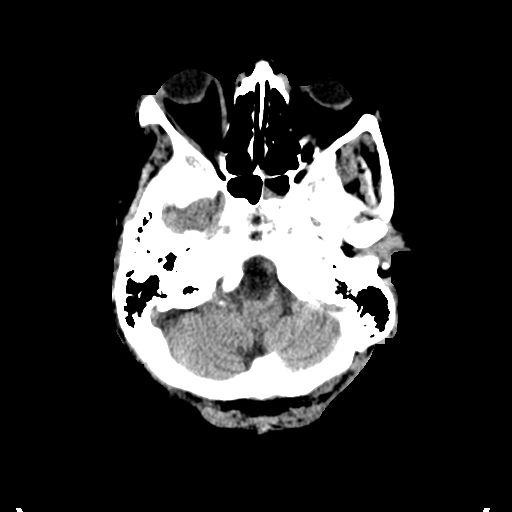
[im 9/30  brain]
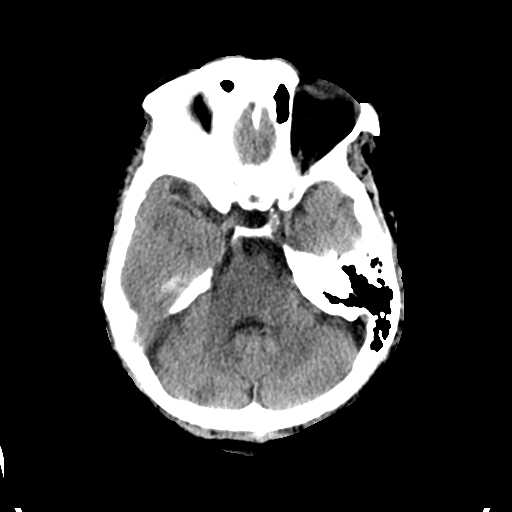
[im 12/30  brain]
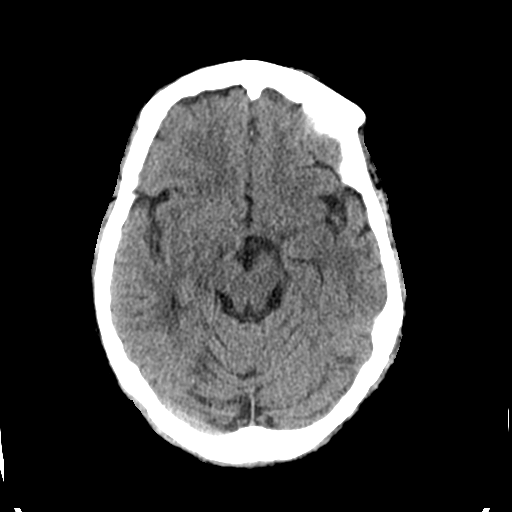
[im 16/30  brain]
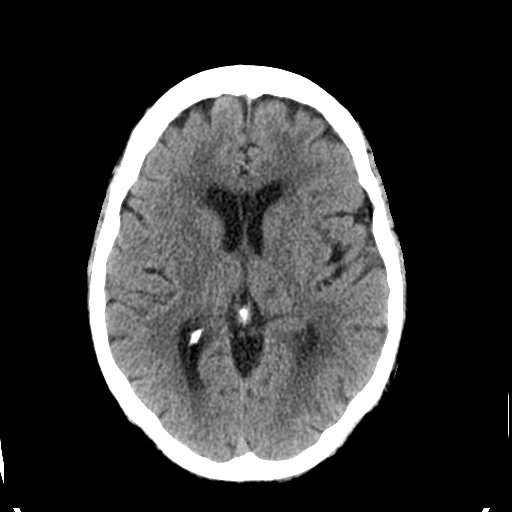
[im 16/30  bone]
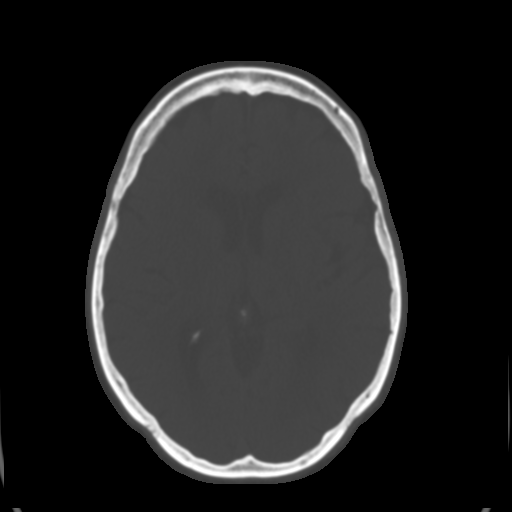
[im 19/30  brain]
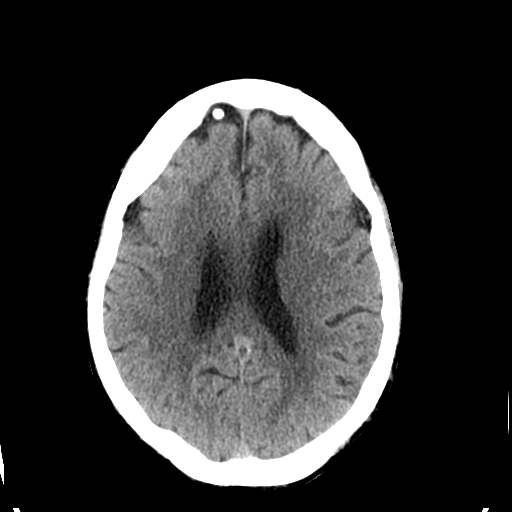
[im 22/30  brain]
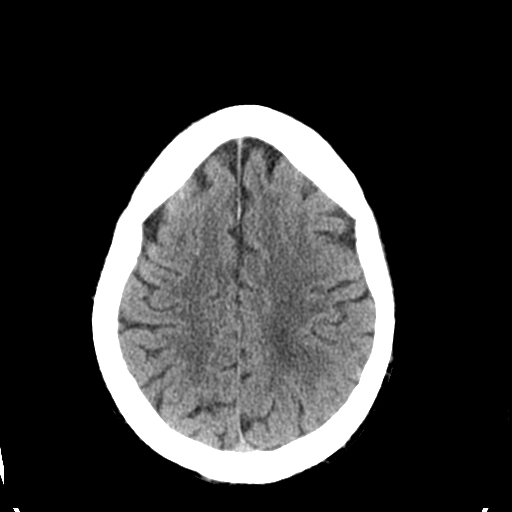
[im 25/30  brain]
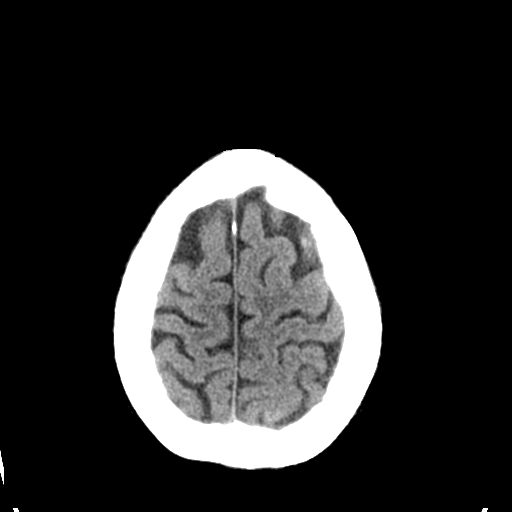
[im 28/30  brain]
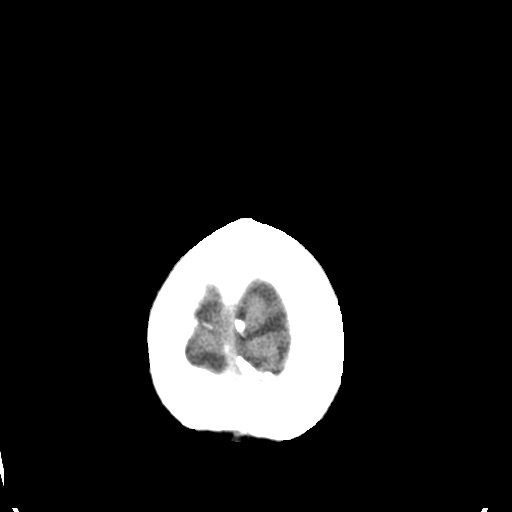
[im 28/30  bone]
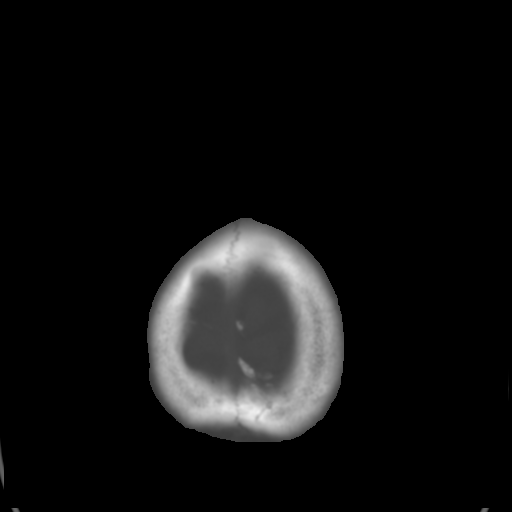

[Series 4: coronal soft tissue · coronal · 0.32mm/px · 3 of 64 slices shown]
[im 22/64  brain]
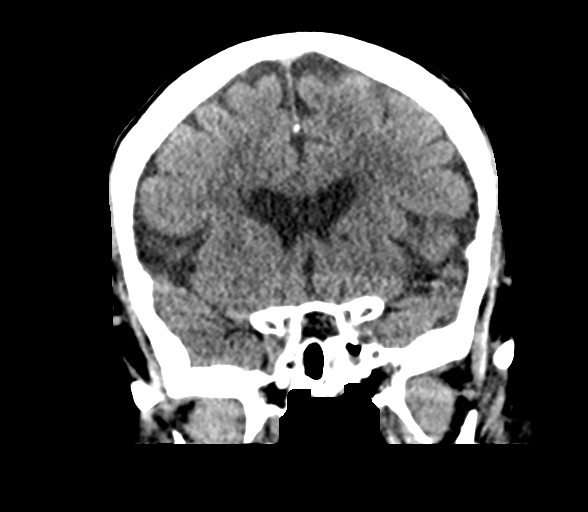
[im 29/64  brain]
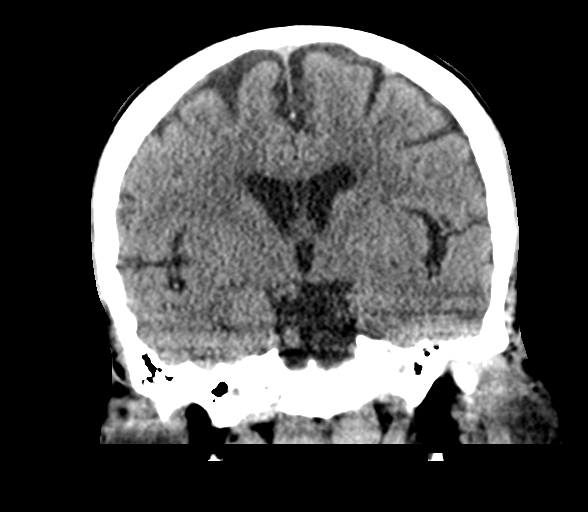
[im 36/64  brain]
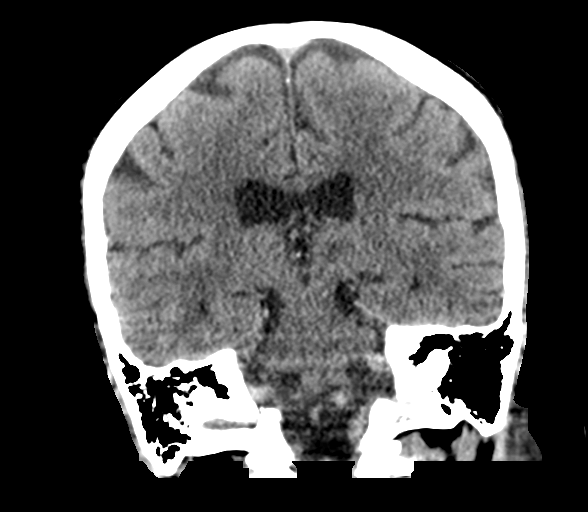

[Series 5: sagittal soft tissue · sagittal · 0.32mm/px · 3 of 55 slices shown]
[im 19/55  brain]
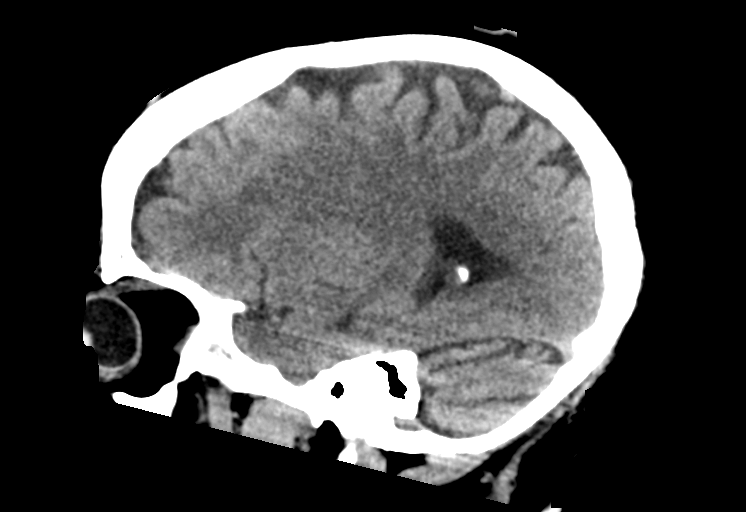
[im 28/55  brain]
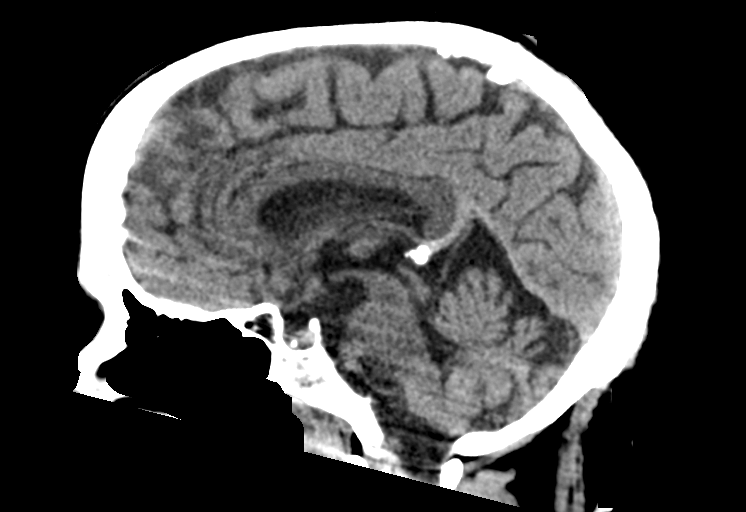
[im 37/55  brain]
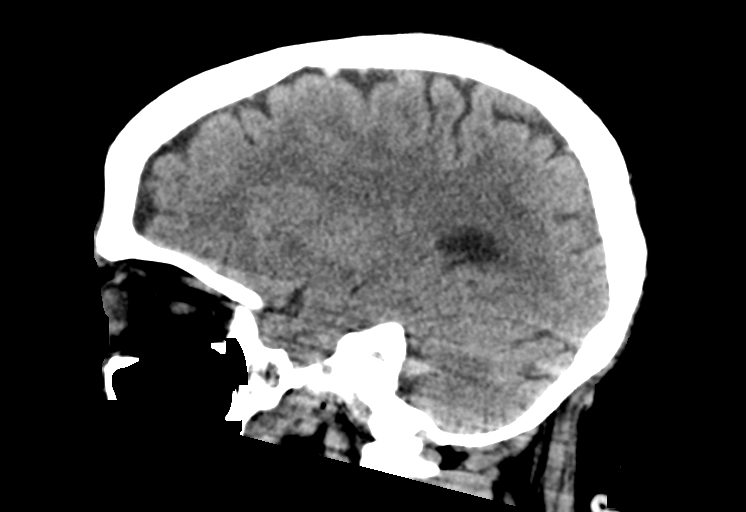

[15 of 47 positions shown; findings below may reference images not displayed]

FINDINGS: Brain: Premature chronic small vessel ischemia with remote lacunar
infarcts in the bilateral thalamus and left caudate head. Small
remote bilateral cerebellar infarcts. Ischemic gliosis in the
cerebral white matter.

No evidence of acute infarct, hemorrhage, hydrocephalus, or masslike
findings.

Vascular: Vertebrobasilar tortuosity. Atherosclerotic calcification.
No hyperdense vessel.

Skull: No acute or aggressive finding

Sinuses/Orbits: Remote blowout fracture of the medial wall left
orbit.
IMPRESSION: 1. No acute finding.
2. Premature chronic small vessel ischemia with remote lacunar
infarcts, as described.

## 2019-01-14 IMAGING — CT CT ANGIO CHEST
2 of 9 series · 18 of 46 positions shown · IV contrast (isovue)
Comparison: CT chest 11/08/2012

CLINICAL DATA: Dyspnea, question pulmonary embolism, history type
II diabetes mellitus, smoking, COPD, essential hypertension, chronic
kidney disease, hypertensive cardiomyopathy

EXAM:
CT ANGIOGRAPHY CHEST WITH CONTRAST
TECHNIQUE: Multidetector CT imaging of the chest was performed using the
standard protocol during bolus administration of intravenous
contrast. Multiplanar CT image reconstructions and MIPs were
obtained to evaluate the vascular anatomy.
CONTRAST:  80 cc Isovue 370 IV

[Series 9: thins · axial · 0.74mm/px · z∈[-594,-345]mm · 15 of 281 slices shown]
[im 16/281  lung]
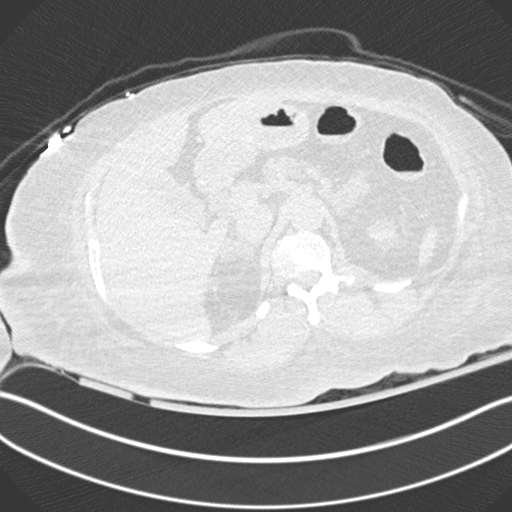
[im 32/281  soft-tissue]
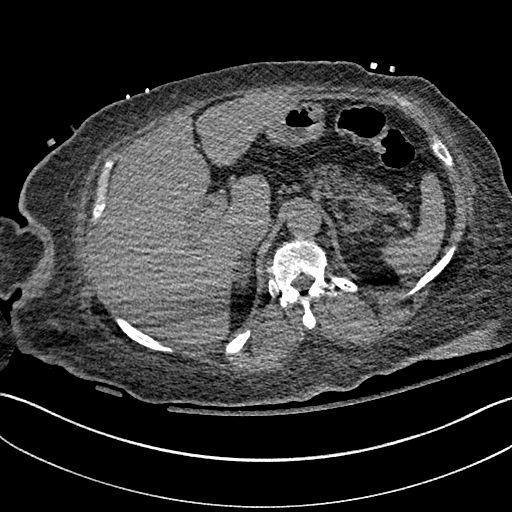
[im 47/281  lung]
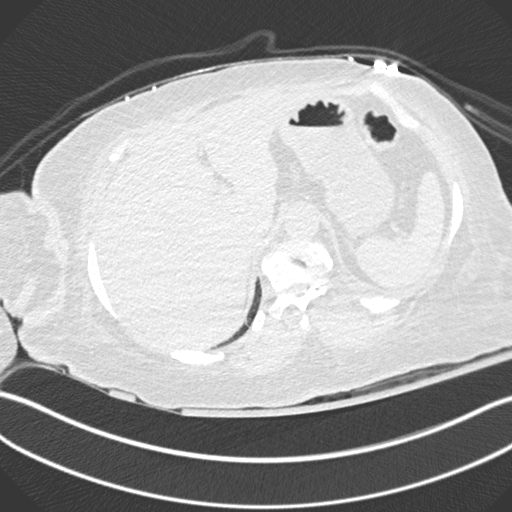
[im 63/281  soft-tissue]
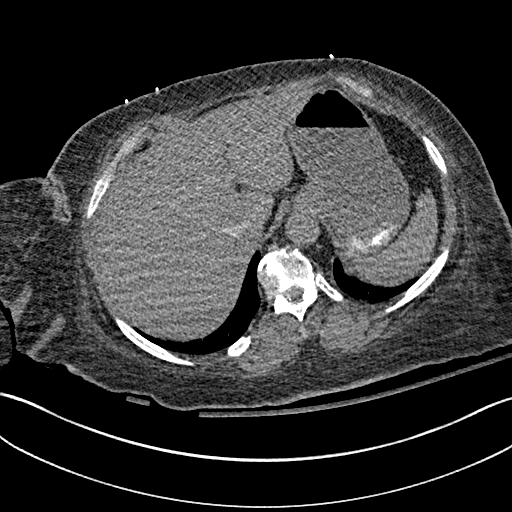
[im 94/281  lung]
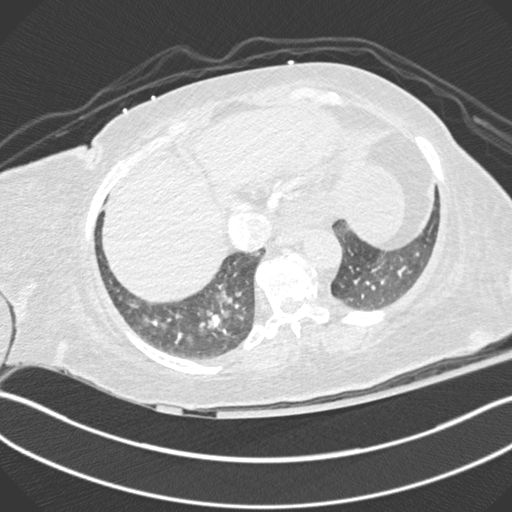
[im 109/281  soft-tissue]
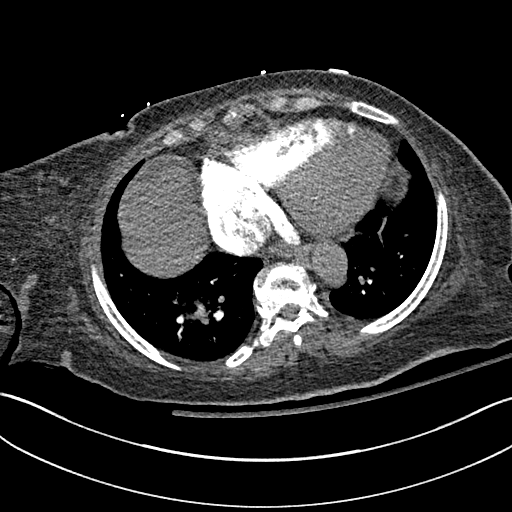
[im 125/281  lung]
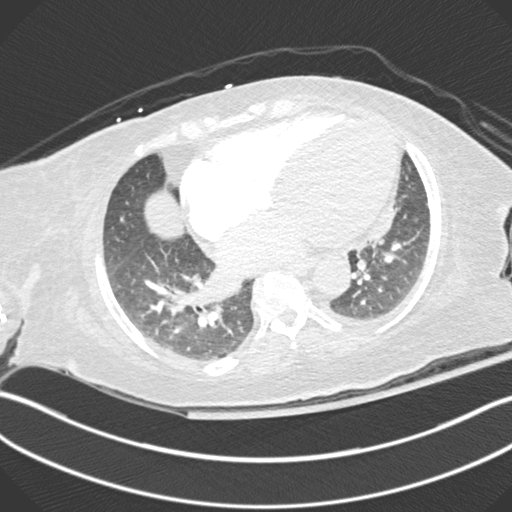
[im 141/281  soft-tissue]
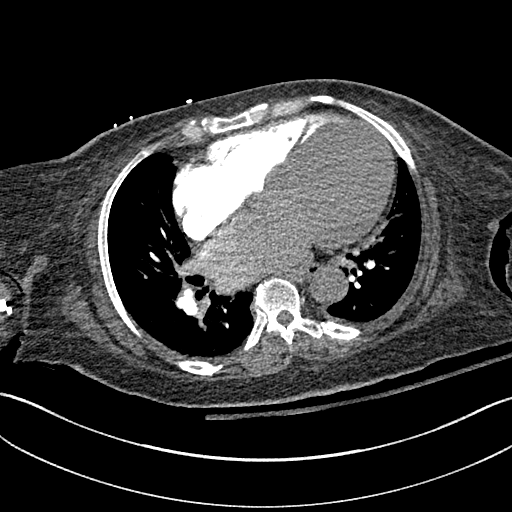
[im 156/281  lung]
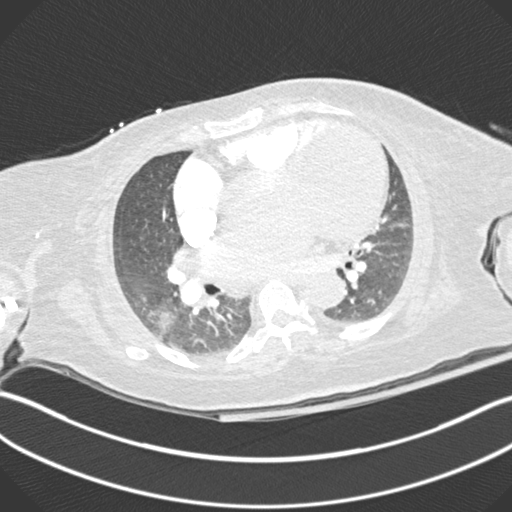
[im 172/281  soft-tissue]
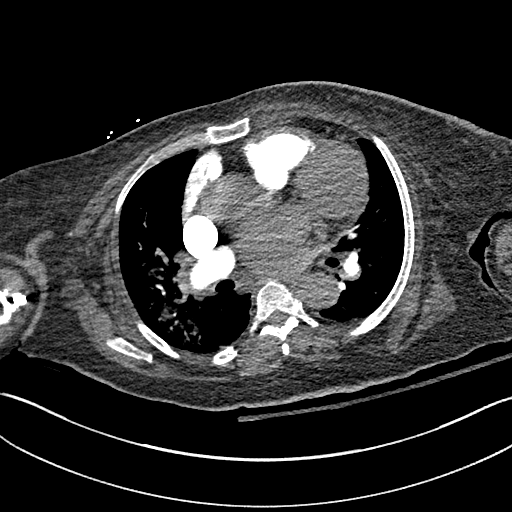
[im 187/281  lung]
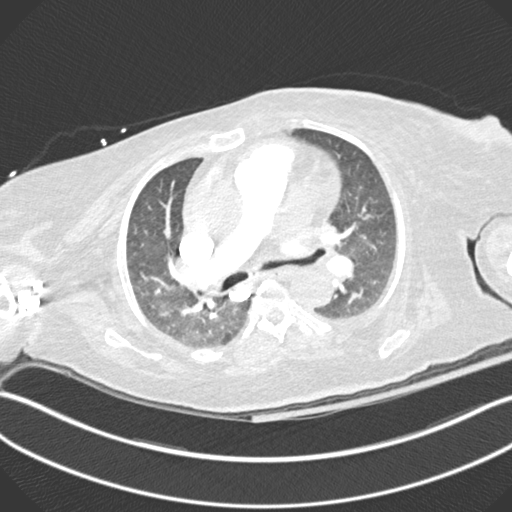
[im 218/281  soft-tissue]
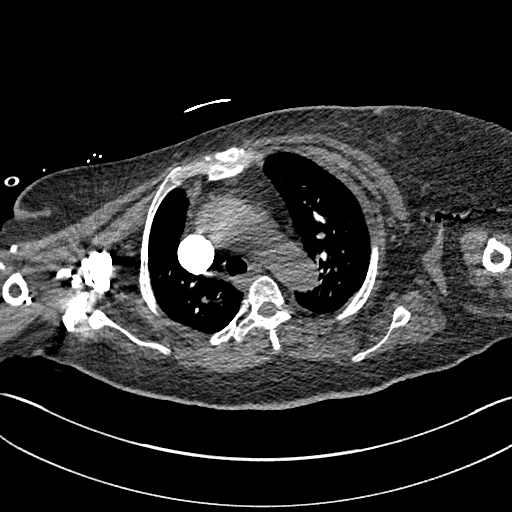
[im 234/281  lung]
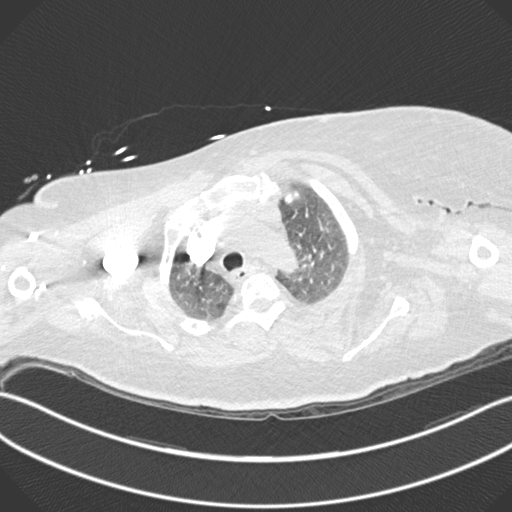
[im 249/281  soft-tissue]
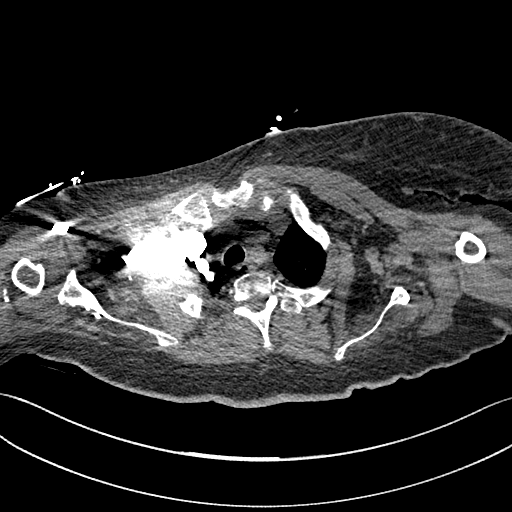
[im 265/281  lung]
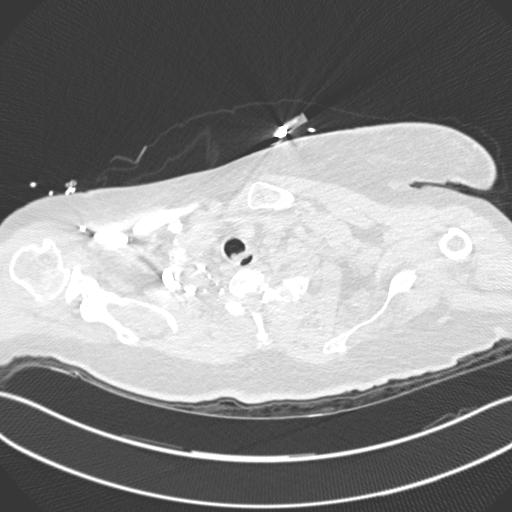

[Series 11: coronal mpr · coronal · 0.59mm/px · 3 of 151 slices shown]
[im 38/151  soft-tissue]
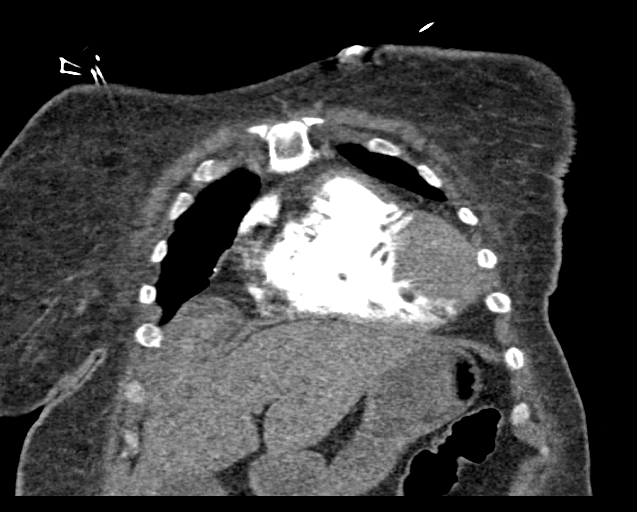
[im 76/151  soft-tissue]
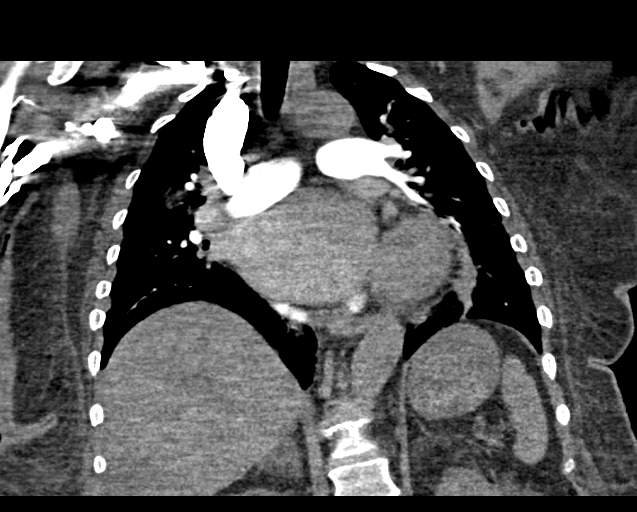
[im 113/151  soft-tissue]
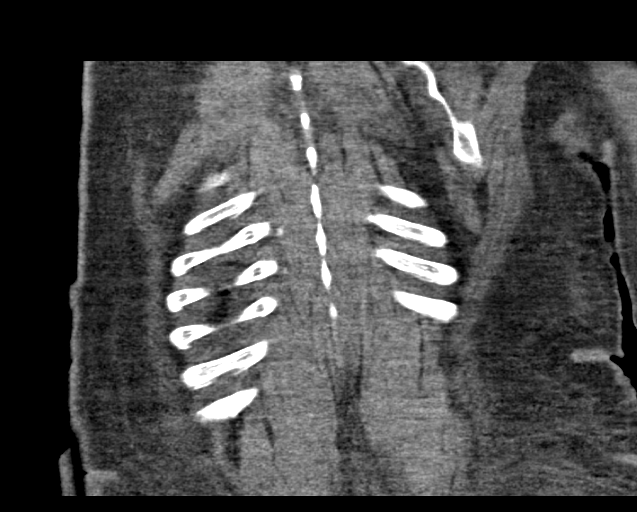

[18 of 46 positions shown; findings below may reference images not displayed]

FINDINGS: Cardiovascular: Motion artifacts limit assessment of the ascending
aorta. Ascending aorta appears upper normal caliber without
aneurysm. Suboptimal assessment for dissection due to lack of
adequate aortic enhancement. Pulmonary arteries well opacified with
mild dilatation of the main pulmonary artery 3.5 cm diameter. No
evidence of pulmonary embolism. No pericardial effusion.

Mediastinum/Nodes: Esophagus unremarkable. Base of cervical region
normal appearance. No thoracic adenopathy.

Lungs/Pleura: Patchy infiltrates throughout RIGHT upper and RIGHT
lower lobes, minimally in RIGHT middle lobe question pneumonia.
Subsegmental atelectasis in the anterior portion of the LEFT lower
lobe adjacent to the basilar portion of the major fissure. No
pleural effusion or pneumothorax.

Upper Abdomen: BILATERAL adrenal gland thickening without discrete
nodule. Remaining visualized upper abdomen unremarkable.

Musculoskeletal: No acute osseous findings.

Review of the MIP images confirms the above findings.
IMPRESSION: No evidence of pulmonary embolism.

Patchy infiltrates in RIGHT lung question pneumonia versus
asymmetric edema.

Subsegmental atelectasis LEFT lower lobe.

Enlargement of cardiac chambers with upper normal caliber of the
ascending thoracic aorta.

## 2019-01-15 IMAGING — DX DG CHEST 1V
1 series · 1 of 1 positions shown · non-contrast
Comparison: CT scan of the chest and chest x-ray June 23, 2017

CLINICAL DATA: Shortness of breath, acute respiratory failure with
hypoxia, chronic renal insufficiency, cardiomyopathy, COPD, current
smoker.

EXAM:
CHEST 1 VIEW

[chest ap]
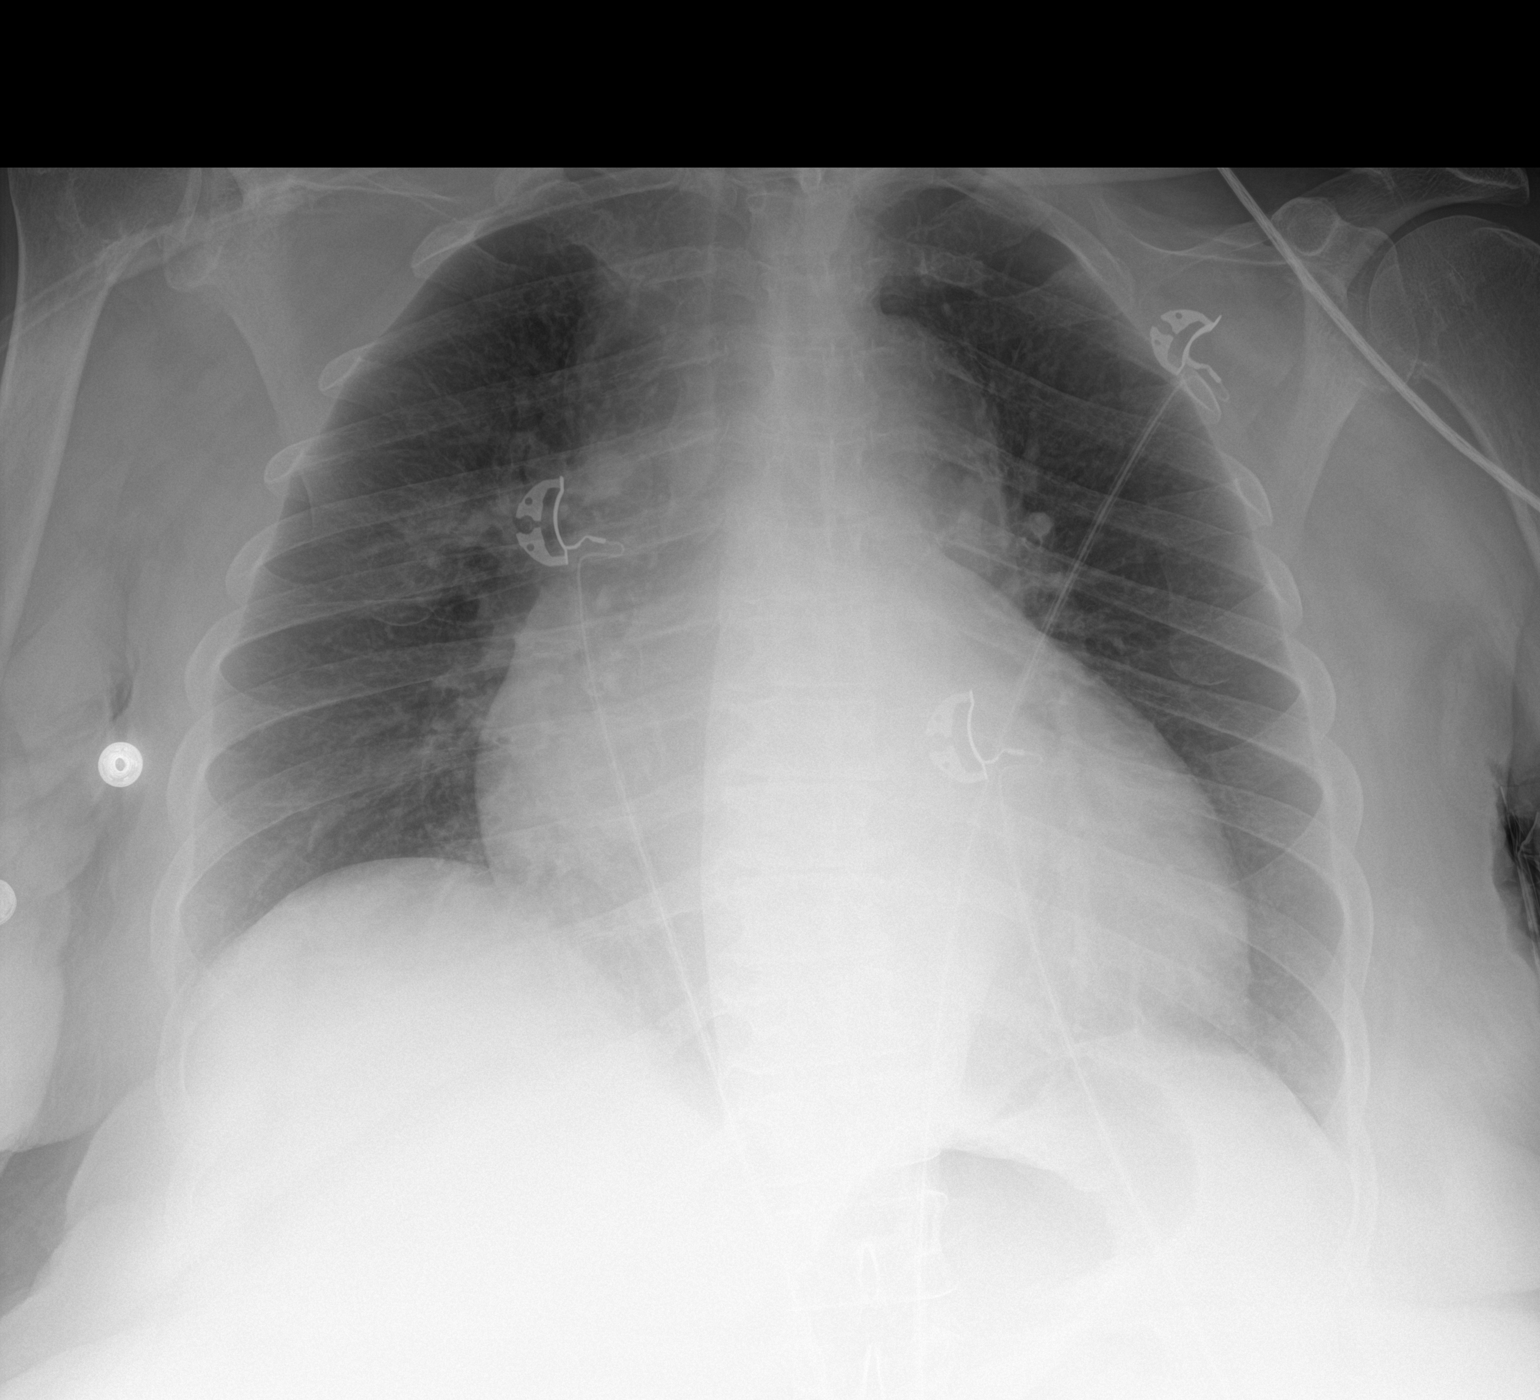

[1 of 1 positions shown; findings below may reference images not displayed]

FINDINGS: The lungs are mildly hyperinflated despite the apical lordotic
positioning. Hazy increased density in the right perihilar region
persists. The retrocardiac region on the left exhibits increased
density. There is no pleural effusion or pneumothorax. The cardiac
silhouette is enlarged. The pulmonary vascularity is not engorged.
The bony thorax is unremarkable.
IMPRESSION: Hyperinflation consistent with COPD. Right perihilar and left
retrocardiac interstitial density compatible with atelectasis or
pneumonia. Cardiomegaly without significant pulmonary vascular
congestion or pulmonary edema. No pleural effusion.

## 2019-01-15 IMAGING — MR MR MRA HEAD W/O CM
9 of 11 series · 30 of 48 positions shown · non-contrast
Comparison: Prior CT from 06/23/2017.

CLINICAL DATA: Initial evaluation for acute unresponsiveness,
stroke follow-up.

EXAM:
MRI HEAD WITHOUT CONTRAST
MRA HEAD WITHOUT CONTRAST
TECHNIQUE: Multiplanar, multiecho pulse sequences of the brain and surrounding
structures were obtained without intravenous contrast. Angiographic
images of the head were obtained using MRA technique without
contrast.

[Series 3: DWI · axial · 3.0mm · 1.09mm/px · z∈[-96,+51]mm · 7 of 100 slices shown (1 of 4)]
[im 1/100]
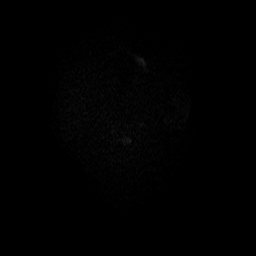
[im 17/100]
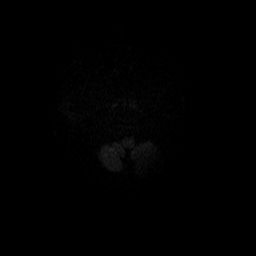
[im 34/100]
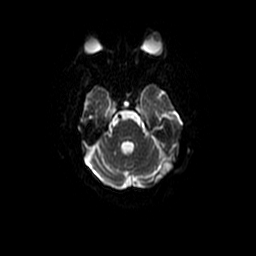
[im 50/100]
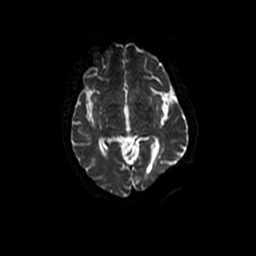
[im 67/100]
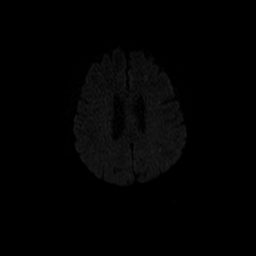
[im 83/100]
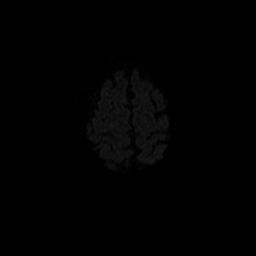
[im 100/100]
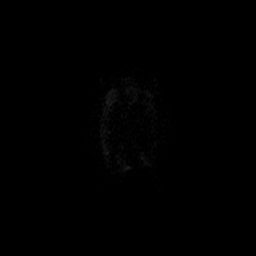

[Series 4: (id) mt fs · axial · 1.4mm · 0.43mm/px · z∈[-75,-64]mm · 2 of 152 slices shown]
[im 1/152]
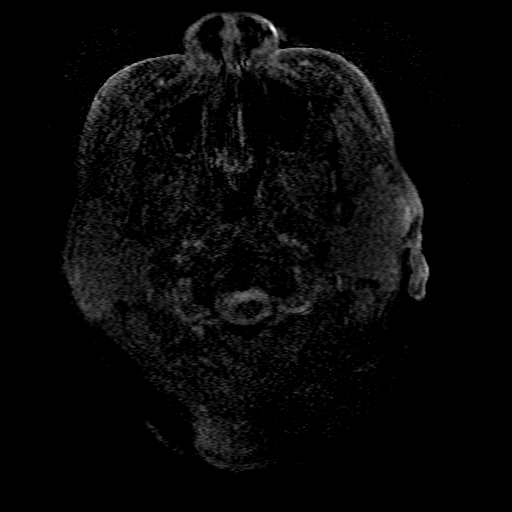
[im 17/152]
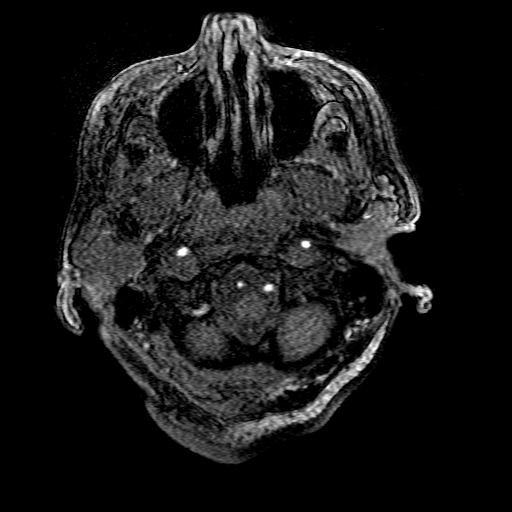

[Series 5: DWI · coronal · 5.0mm · 1.09mm/px · 6 of 74 slices shown (2 of 4)]
[im 1/74]
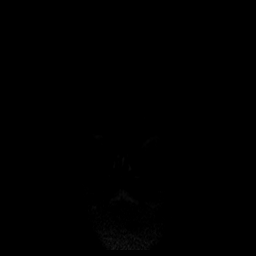
[im 15/74]
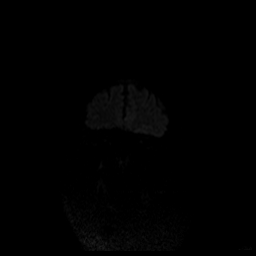
[im 30/74]
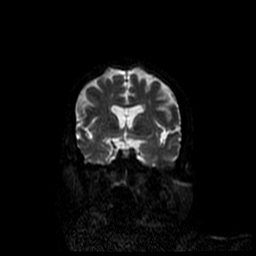
[im 44/74]
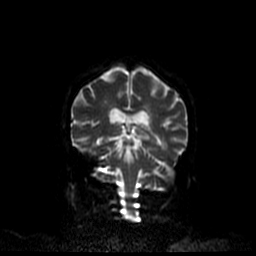
[im 59/74]
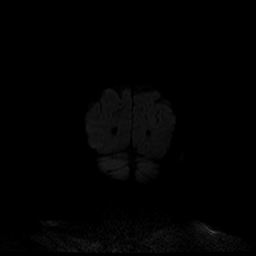
[im 74/74]
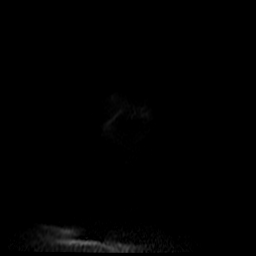

[Series 6: T1 · sagittal · 5.0mm · 0.47mm/px · 2 of 24 slices shown]
[im 1/24]
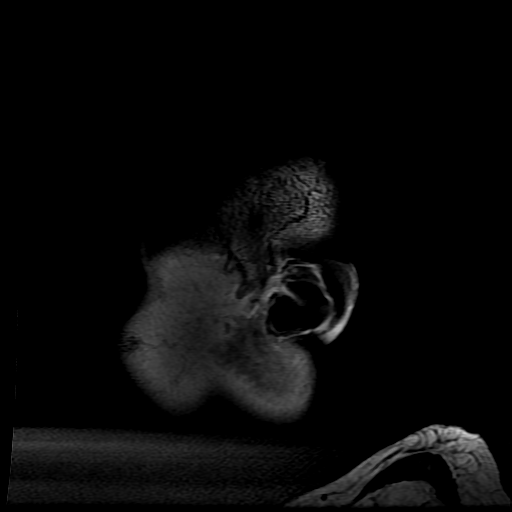
[im 24/24]
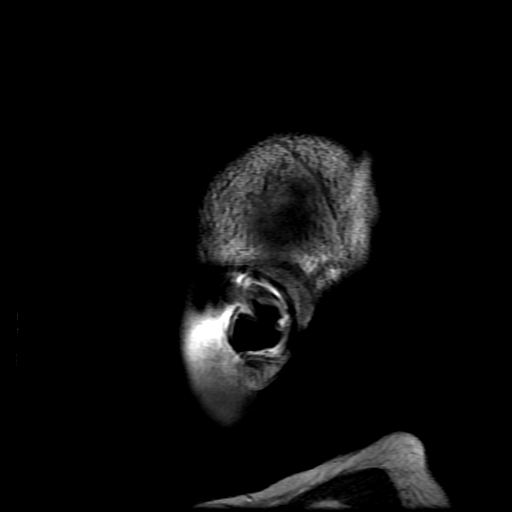

[Series 7: T2 · axial · 5.0mm · 0.43mm/px · z∈[-80,+58]mm · 2 of 24 slices shown (1 of 2)]
[im 1/24]
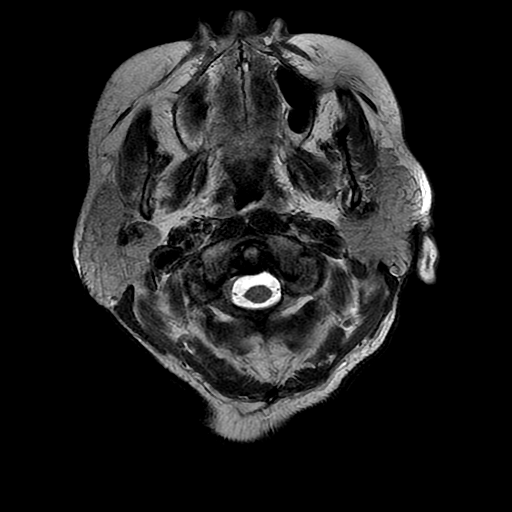
[im 24/24]
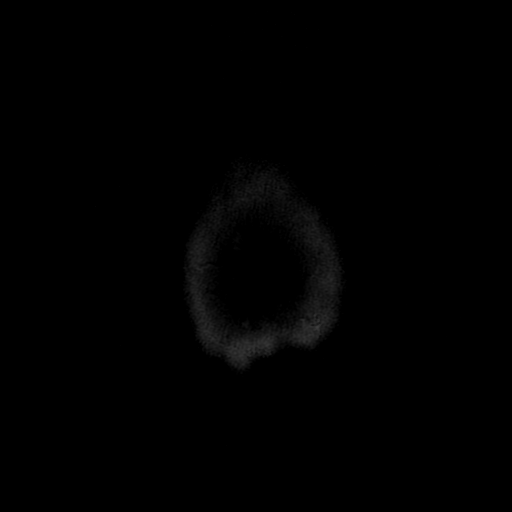

[Series 8: FLAIR · axial · 5.0mm · 0.43mm/px · z∈[-80,+58]mm · 2 of 24 slices shown]
[im 1/24]
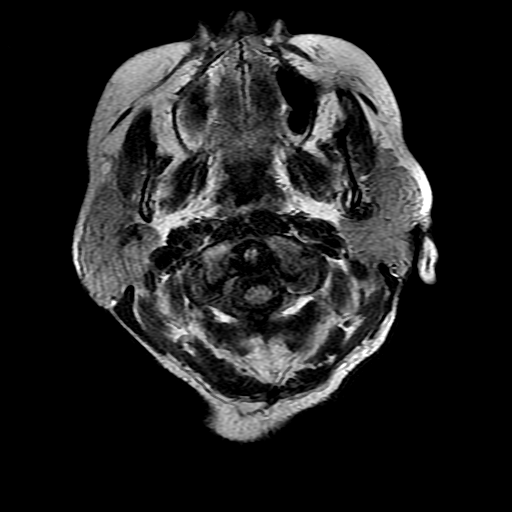
[im 24/24]
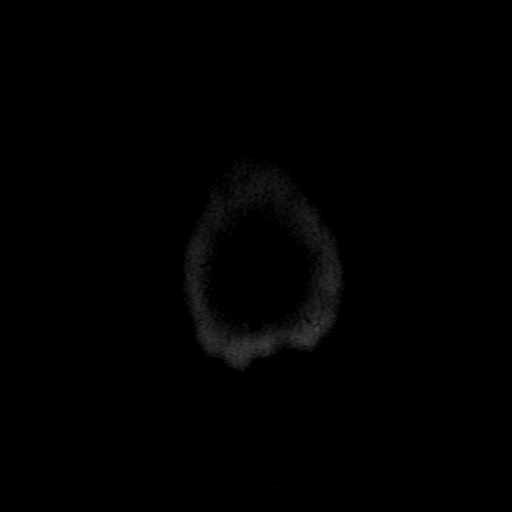

[Series 11: T2 · coronal · 5.0mm · 0.39mm/px · 2 of 29 slices shown (2 of 2)]
[im 1/29]
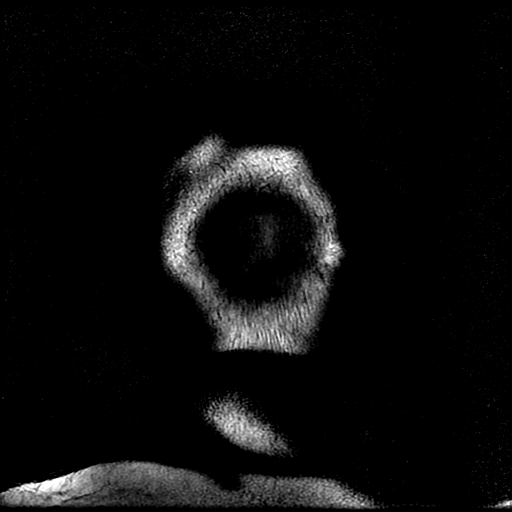
[im 29/29]
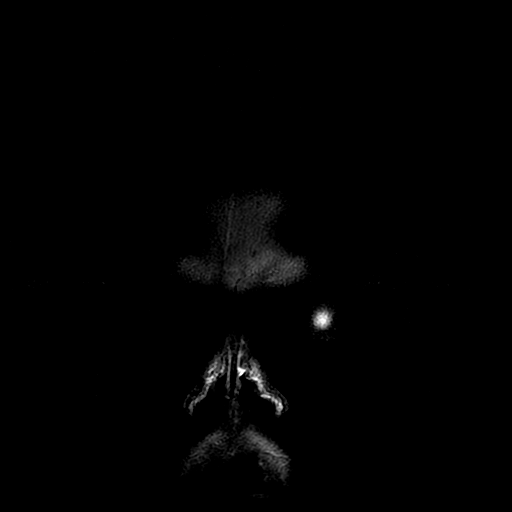

[Series 300: DWI · axial · 3.0mm · 1.09mm/px · z∈[-96,+51]mm · 4 of 50 slices shown (3 of 4)]
[im 1/50]
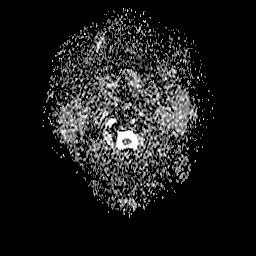
[im 17/50]
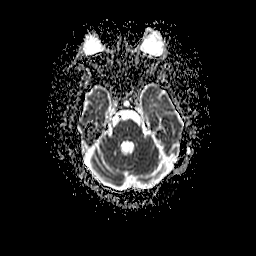
[im 33/50]
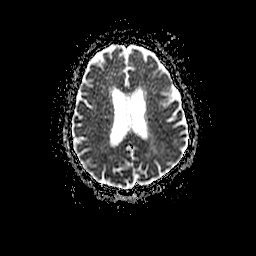
[im 50/50]
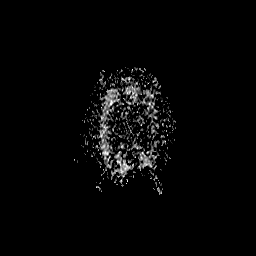

[Series 500: DWI · coronal · 5.0mm · 1.09mm/px · 3 of 37 slices shown (4 of 4)]
[im 1/37]
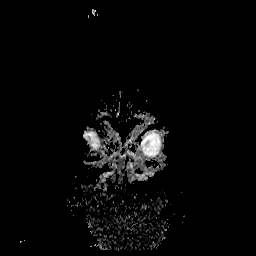
[im 19/37]
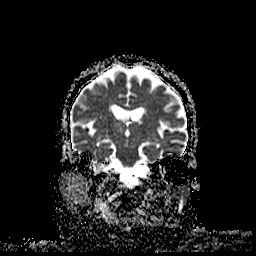
[im 37/37]
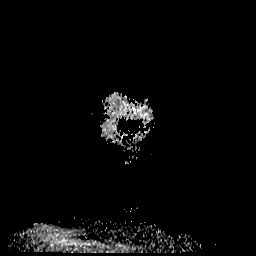

[30 of 48 positions shown; findings below may reference images not displayed]

FINDINGS: MRI HEAD FINDINGS

Brain: Diffuse prominence of the CSF containing spaces compatible
with generalized cerebral atrophy. Patchy and confluent T2/FLAIR
hyperintensity within the periventricular and deep white matter both
cerebral hemispheres most compatible chronic small vessel ischemic
disease, moderate nature. Remote lacunar infarcts present within the
left thalamus and left basal ganglia. Scattered small remote
bilateral cerebellar infarcts present.

There are scattered subcentimeter foci of diffusion abnormality
involving the bilateral cerebral hemispheres, consistent with small
acute ischemic infarcts. These predominantly involve the deep white
matter of both cerebral hemispheres, although there is patchy
involvement within the deep gray nuclei as well. Reference purposes,
largest focus on the right is positioned within the posterior right
centrum semi ovale and measures 6 mm (series 3, image 37). Largest
focus on the left is positioned within the lateral left thalamus and
measures 7 mm (series 3, image 28). No associated mass effect or
hemorrhage. Sparing of the brainstem and cerebellum noted.

Few punctate foci susceptibility fact noted within the left
thalamus, consistent with tiny chronic micro hemorrhages, likely
hypertensive in nature. No other evidence for acute or chronic
intracranial hemorrhage.

No mass lesion, midline shift or mass effect. No hydrocephalus. No
extra-axial fluid collection. Major dural sinuses are grossly
patent.

Incidental note made of a partially empty sella. Suprasellar region
normal.

Vascular: Major intracranial vascular flow voids are maintained.

Skull and upper cervical spine: Craniocervical junction within
normal limits. Visualized upper cervical spine unremarkable. Bone
marrow signal intensity within normal limits. No scalp soft tissue
abnormality.

Sinuses/Orbits: Globes and orbital soft tissues within normal
limits. Remote posttraumatic defect noted at the left lamina
papyracea. Paranasal sinuses are clear. Small left mastoid effusion
noted, of doubtful significance. Inner ear structures grossly
normal.

MRA HEAD FINDINGS

ANTERIOR CIRCULATION:

Study degraded by motion artifact.

Distal cervical segments of the internal carotid artery is are
patent with antegrade flow. Petrous segments patent bilaterally
without flow-limiting stenosis. Atheromatous irregularity within the
cavernous ICAs without high-grade stenosis. ICA termini widely
patent. A1 segments patent. Anterior communicating artery normal.
Anterior cerebral arteries demonstrate multifocal atheromatous
irregularity but are patent to their distal aspects.

Atheromatous irregularity within the left M1 segment without
flow-limiting stenosis. Smooth tapering of the distal right MCA with
moderate to severe narrowing. MCA bifurcations within normal limits.
Proximal M2 vessels not well evaluated due to motion, although did
do not appear occluded. Atheromatous irregularity within the distal
MCA branches bilaterally.

POSTERIOR CIRCULATION:

Vertebral arteries patent to the vertebrobasilar junction without
flow-limiting stenosis. Left vertebral artery slightly dominant.
Posterior inferior cerebral artery is not visualized on this exam.
Basilar artery tortuous but widely patent to its distal aspect
without flow-limiting stenosis. Superior cerebral arteries patent
proximally. Both of the posterior cerebral artery supplied via the
basilar and are patent to their distal aspects. Multifocal
atheromatous irregularity within the PCAs bilaterally without
flow-limiting stenosis.

No aneurysm or vascular malformation.
IMPRESSION: MRI HEAD IMPRESSION:

1. Multifocal subcentimeter acute ischemic infarcts involving the
bilateral cerebral hemispheres as above. Findings are most likely
embolic in nature. No associated hemorrhage or mass effect.
2. Moderate chronic microvascular ischemic disease with additional
remote infarcts involving the left basal ganglia, left thalamus, and
bilateral cerebellar hemispheres.

MRA HEAD IMPRESSION:

1. Negative intracranial MRA for large vessel occlusion.
2. Moderate atherosclerotic disease involving the anterior posterior
circulation as above, most notable at the distal right M1 segment.

## 2019-01-17 IMAGING — DX DG CHEST 1V PORT
1 series · 1 of 1 positions shown · non-contrast
Comparison: 06/24/2017, 06/23/2017

CLINICAL DATA: Cough, shortness of breath, respiratory failure

EXAM:
PORTABLE CHEST 1 VIEW

[chest ap]
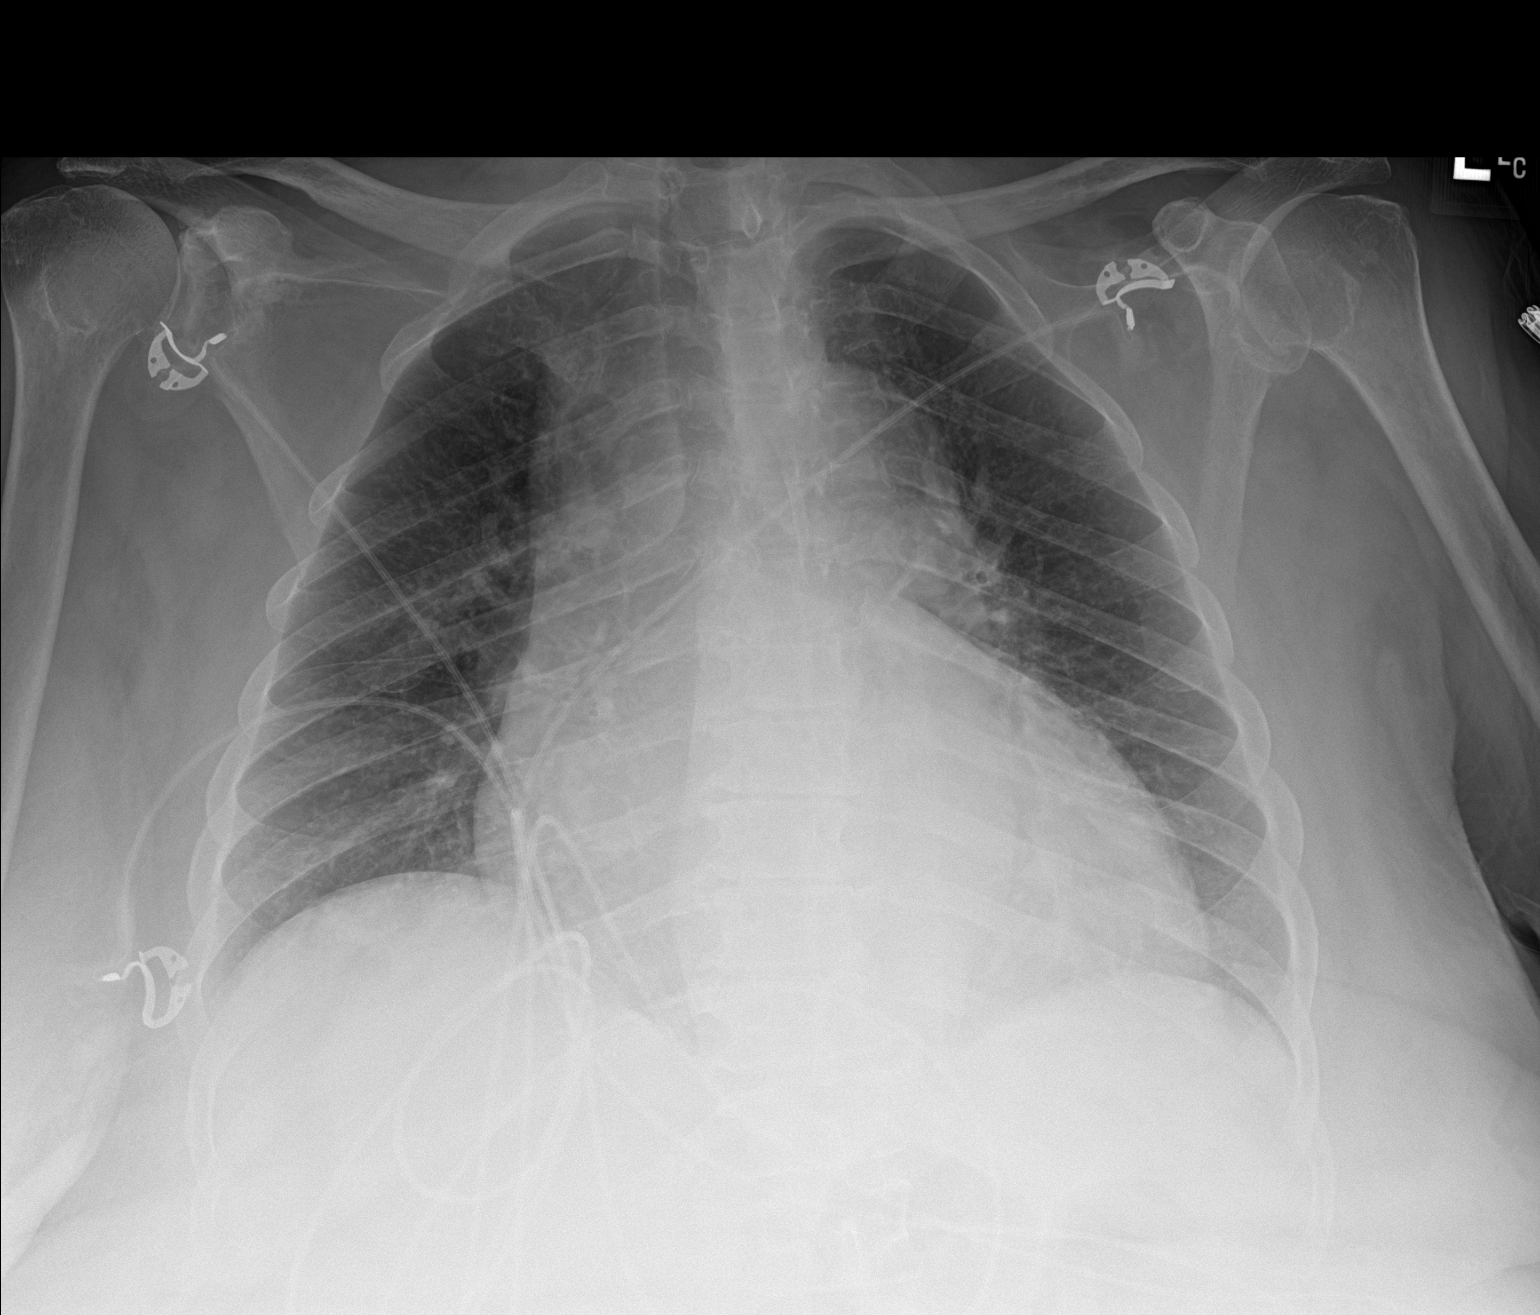

[1 of 1 positions shown; findings below may reference images not displayed]

FINDINGS: Stable cardiomegaly without edema, significant collapse or
consolidation by plain radiography. No effusion or pneumothorax.
Trachea is midline. Aorta is atherosclerotic and ectatic. No
significant interval change. Stable mild hyperinflation.
IMPRESSION: Stable cardiomegaly without significant interval change

## 2019-01-18 ENCOUNTER — Ambulatory Visit: Payer: Self-pay | Admitting: Family Medicine

## 2019-01-19 IMAGING — DX DG ANKLE COMPLETE 3+V*R*
3 series · 3 of 3 positions shown · non-contrast
Comparison: 11/11/2009

CLINICAL DATA: Fall, broke right ankle

EXAM:
RIGHT ANKLE - COMPLETE 3+ VIEW

[ankle ap]
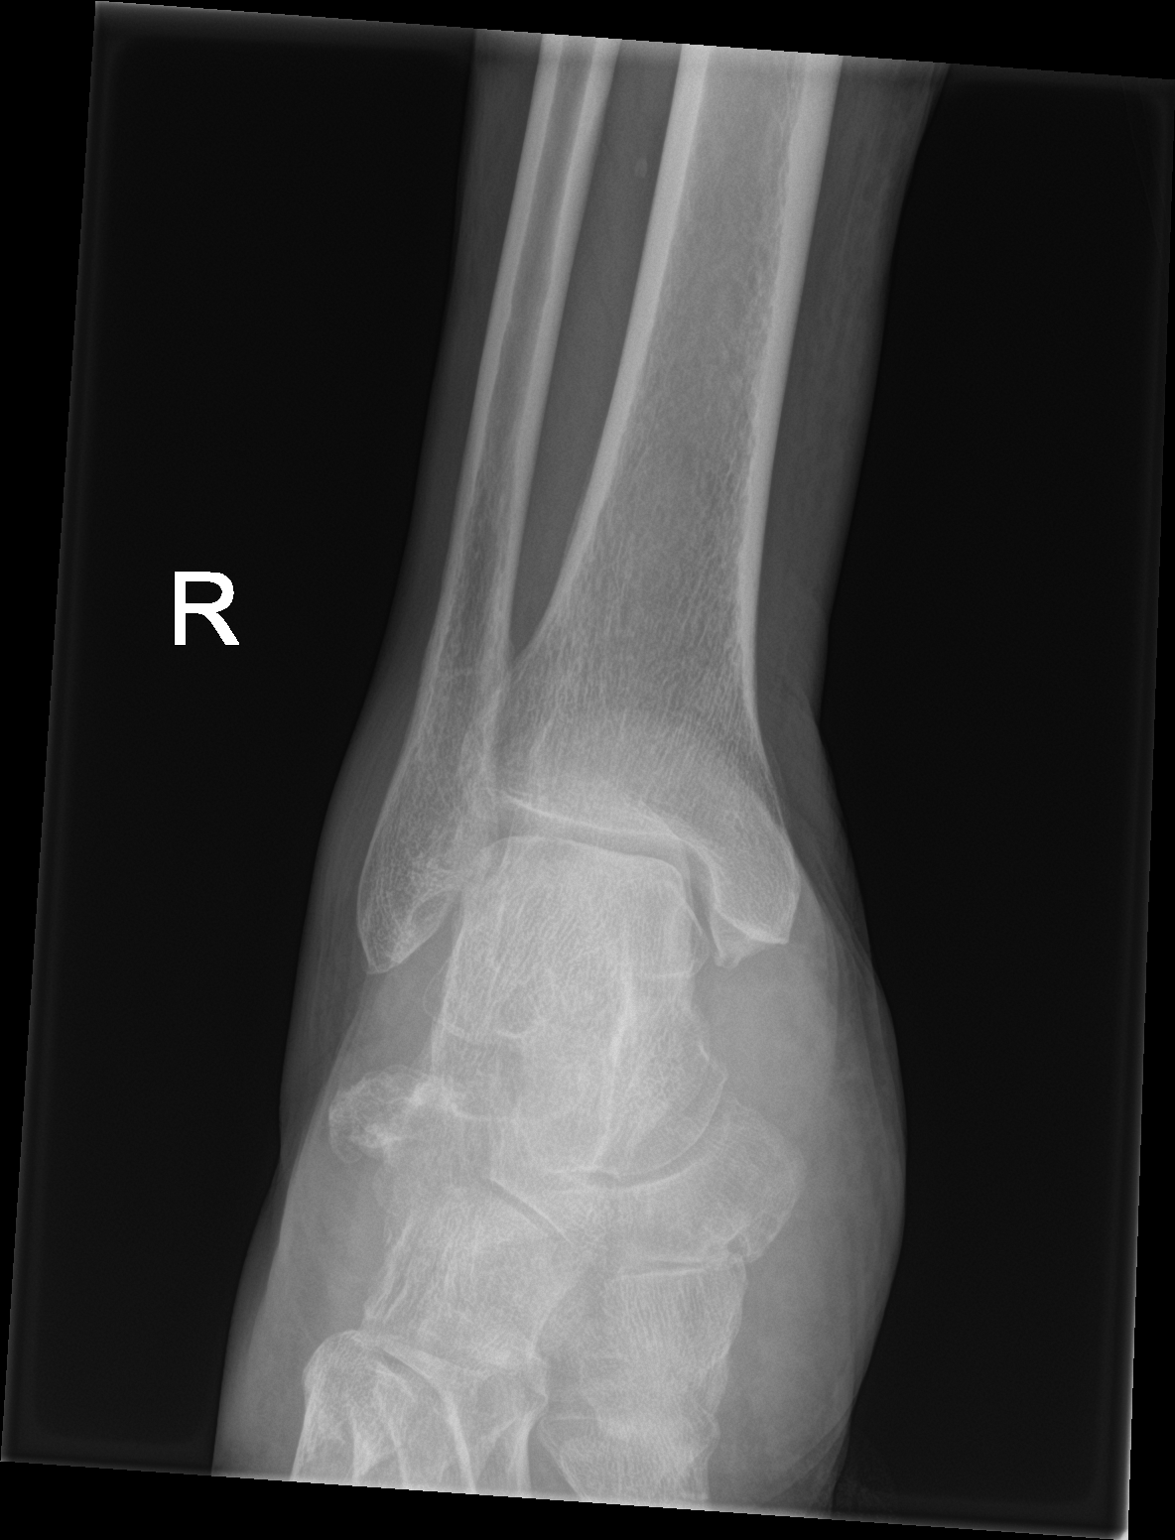

[ankle obl]
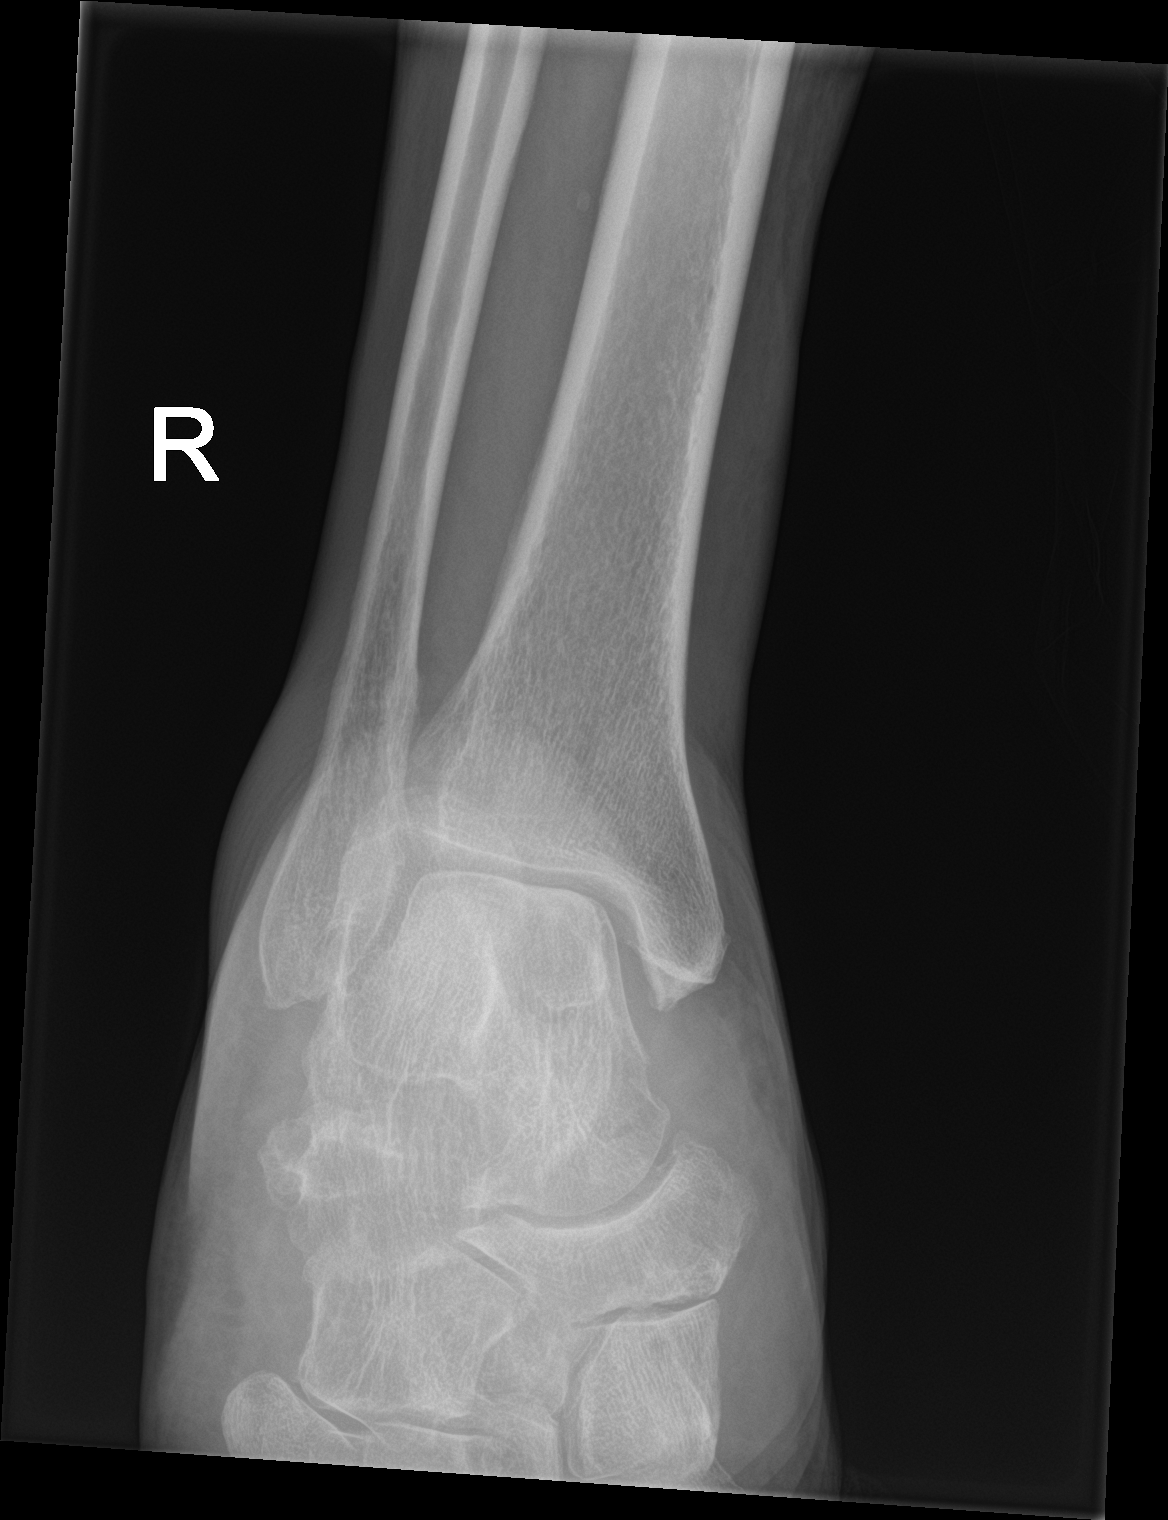

[ankle lat]
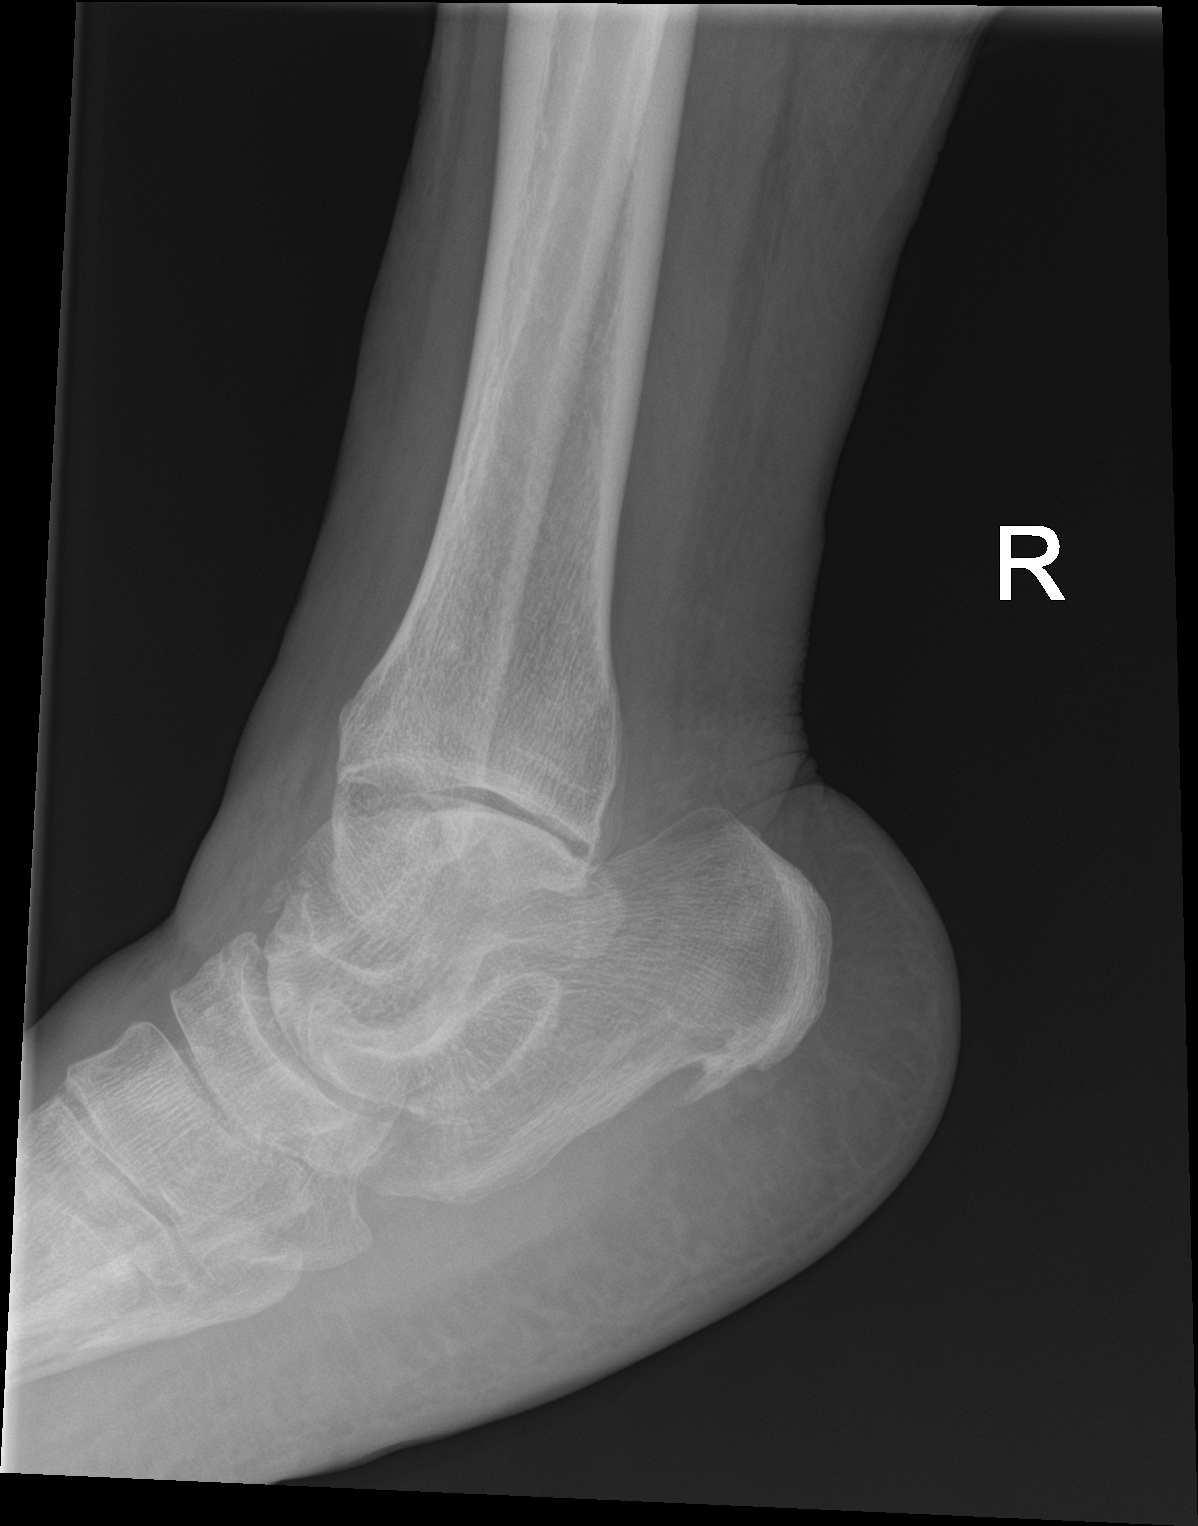

[3 of 3 positions shown; findings below may reference images not displayed]

FINDINGS: No fracture or malalignment is seen. The ankle mortise is symmetric.
Soft tissue swelling medially. Large plantar calcaneal spur.
Probable old cortical avulsion is a anterior to the talus.
IMPRESSION: 1. Soft tissue swelling. No definite acute osseous abnormality.
Possible old cortical avulsion anterior surface of the talus

## 2019-01-21 IMAGING — DX DG CHEST 1V PORT
1 series · 1 of 1 positions shown · non-contrast
Comparison: 06/26/2017

CLINICAL DATA: Altered mental status.  Shortness of breath.

EXAM:
PORTABLE CHEST 1 VIEW

[chest ap]
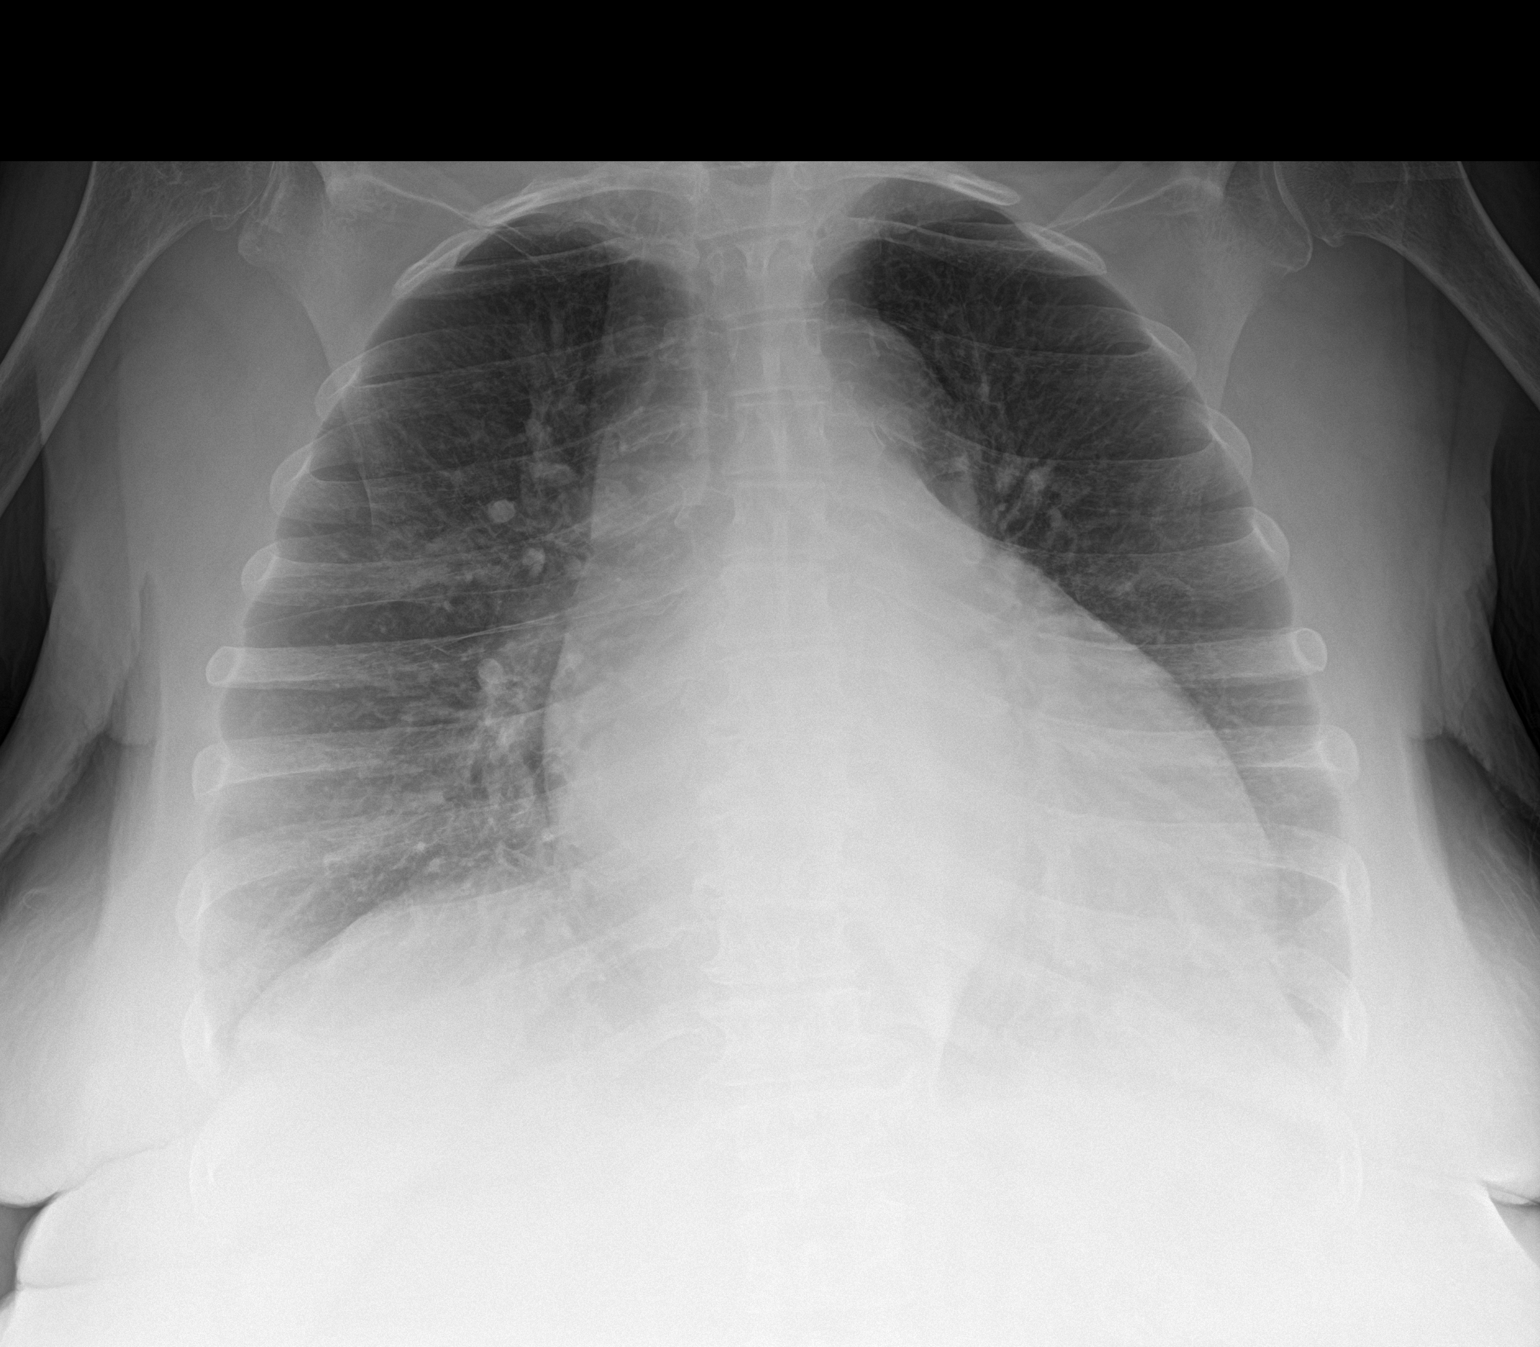

[1 of 1 positions shown; findings below may reference images not displayed]

FINDINGS: Lordotic positioning. Chronic cardiomegaly. Unfolded aorta.
Vascularity is normal. Lungs are clear. No visible effusions. No
acute bone finding.
IMPRESSION: Lordotic positioning. No active disease. Cardiomegaly and aortic
tortuosity.

## 2019-01-24 ENCOUNTER — Encounter: Payer: Self-pay | Admitting: *Deleted

## 2019-01-25 ENCOUNTER — Encounter: Payer: Self-pay | Admitting: Gastroenterology

## 2019-01-25 ENCOUNTER — Encounter

## 2019-01-25 ENCOUNTER — Ambulatory Visit (INDEPENDENT_AMBULATORY_CARE_PROVIDER_SITE_OTHER): Payer: Medicare Other | Admitting: Gastroenterology

## 2019-01-25 DIAGNOSIS — Z1211 Encounter for screening for malignant neoplasm of colon: Secondary | ICD-10-CM | POA: Diagnosis not present

## 2019-01-25 DIAGNOSIS — M79641 Pain in right hand: Secondary | ICD-10-CM | POA: Diagnosis not present

## 2019-01-25 DIAGNOSIS — Z5189 Encounter for other specified aftercare: Secondary | ICD-10-CM | POA: Diagnosis not present

## 2019-01-25 DIAGNOSIS — T148XXD Other injury of unspecified body region, subsequent encounter: Secondary | ICD-10-CM | POA: Diagnosis not present

## 2019-01-25 MED ORDER — OMEPRAZOLE 20 MG PO CPDR: DELAYED_RELEASE_CAPSULE | ORAL | 3 refills | Status: DC

## 2019-01-25 NOTE — Progress Notes (Signed)
Subjective:    Patient ID: [name], female    DOB: May 24, 1966, 53 y.o.   MRN: [id number]  [name], MD  HPI SINCE 2007 HAD STROKE AND IS IN A WHEELCHAIR. BOYFRIEND USED TO DRESS UP  AS A WOMAN. NO RECENT CVA. GOT BIT BY DOG COUPLE MOS AGO AND HAD TO GET A SKIN GRAFT ON R HAND RING FINGER. NO LONGER DRINKING ETOH OR COCAINE. NOT FEELING HEART RACING OR SKIPPING. WENT TO SEE HAND SURGERY. BMs: Q3 DAYS. CAN WALK SOME. DOESN'T HAVE FAMILY TO HELP AND GETS BACK AND FORTH VIA SAFE HANDS. HAS FAMILY THAT CHECKS ON HER AND CAN SIGN HER OUT.   PT DENIES FEVER, CHILLS, HEMATOCHEZIA, HEMATEMESIS, nausea, vomiting, melena, diarrhea, CHEST PAIN, SHORTNESS OF BREATH, CHANGE IN BOWEL IN HABITS, constipation, abdominal pain, problems swallowing, problems with sedation, OR heartburn or indigestion.  Past Medical History:  Diagnosis Date  . Alcohol use   . Ankle fracture, lateral malleolus, closed 12/30/2011  . Anxiety   . Breast mass, left 12/15/2011  . Chronic anemia   . Chronic combined systolic and diastolic CHF (congestive heart failure) ([place])   . CKD (chronic kidney disease), stage III ([place])    pt denies  . Cocaine abuse ([place])   . COPD (chronic obstructive pulmonary disease) ([place])   . Diabetes mellitus, type 2 ([place])   . Diabetic Charcot foot ([place]) 12/30/2011  . Essential hypertension   . History of GI bleed 12/05/2009   Qualifier: Diagnosis of  By: Zeb Comfort    . Hyperlipidemia   . Hypertensive cardiomyopathy ([place]) 11/08/2012   a. 05/2016: echo showing EF of 20-25% with normal cors by cath in 07/2016.  . Morbid obesity ([place]) 01/31/2008   Qualifier: Diagnosis of  By: Lenn Cal    . Noncompliance with medication regimen   . Obesity   . Panic attacks   . PAT (paroxysmal atrial tachycardia) ([place])    a. started on amiodarone 07/2017 for this.  . Sleep apnea    does not use cpap  . Stroke ([place])    06/24/17  . Tobacco abuse   . Urinary incontinence     Past Surgical  History:  Procedure Laterality Date  . BREAST BIOPSY    . CARDIAC CATHETERIZATION N/A 07/28/2016   Procedure: Left Heart Cath and Coronary Angiography;  Surgeon: Jettie Booze, MD;  Location: [facility];  Service: Cardiovascular;  Laterality: N/A;  . DILATION AND CURETTAGE OF UTERUS    . I&D EXTREMITY Bilateral 09/22/2017   Procedure: BILATERAL DEBRIDEMENT LEG/FOOT ULCERS, APPLY VERAFLO WOUND VAC;  Surgeon: Newt Minion, MD;  Location: [facility];  Service: Orthopedics;  Laterality: Bilateral;  . I&D EXTREMITY Right 10/11/2018   Procedure: IRRIGATION AND DEBRIDEMENT RIGHT HAND;  Surgeon: [name], MD;  Location: [facility];  Service: Orthopedics;  Laterality: Right;  . I&D EXTREMITY Right 10/13/2018   Procedure: REPEAT IRRIGATION AND DEBRIDEMENT RIGHT HAND;  Surgeon: [name], MD;  Location: [facility];  Service: Orthopedics;  Laterality: Right;  . I&D EXTREMITY Right 11/22/2018   Procedure: IRRIGATION AND DEBRIDEMENT AND PINNING RIGHT HAND;  Surgeon: [name], MD;  Location: [facility];  Service: Orthopedics;  Laterality: Right;  . IR RADIOLOGIST EVAL & MGMT  07/05/2018  . SKIN SPLIT GRAFT Bilateral 09/28/2017   Procedure: BILATERAL SPLIT THICKNESS SKIN GRAFT LEGS/FEET AND APPLY VAC;  Surgeon: Newt Minion, MD;  Location: [facility];  Service: Orthopedics;  Laterality: Bilateral;  . SKIN SPLIT  GRAFT Right 11/22/2018   Procedure: SKIN GRAFT SPLIT THICKNESS;  Surgeon: [name], MD;  Location: [facility];  Service: Orthopedics;  Laterality: Right;    No Known Allergies  Current Outpatient Medications  Medication Sig    . albuterol (PROAIR HFA) 108 (90 Base) MCG/ACT inhaler INHALE 2 PUFFS EVERY 6 HOURS AS NEEDED FOR SHORTNESS OF BREATH/WHEEZING. (Patient taking differently: Inhale 2 puffs into the lungs every 6 (six) hours as needed for wheezing or shortness of breath. )    . amiodarone (PACERONE) 200 MG tablet Take 1 tablet (200 mg total) by mouth 2 (two) times daily.    Marland Kitchen  amLODipine (NORVASC) 10 MG tablet TAKE 1 TABLET BY MOUTH ONCE A DAY.    Marland Kitchen apixaban (ELIQUIS) 5 MG TABS tablet Take 1 tablet (5 mg total) by mouth 2 (two) times daily for 30 days.    Marland Kitchen aspirin EC 81 MG tablet Take 1 tablet (81 mg total) by mouth daily.    . cloNIDine (CATAPRES) 0.3 MG tablet One tablet at bedtime for blood pressure    . DULoxetine (CYMBALTA) 30 MG capsule Take 60 mg by mouth daily.     Marland Kitchen ezetimibe (ZETIA) 10 MG tablet Take 1 tablet (10 mg total) by mouth daily.    . furosemide (LASIX) 20 MG tablet Take 2 tablets (40 mg total) by mouth daily.    Marland Kitchen gabapentin (NEURONTIN) 300 MG capsule  Take 300 mg by mouth 3 (three) times daily.     Marland Kitchen HYDROcodone-acetaminophen (NORCO) 5-325 MG tablet Take 1 tablet by mouth every 6 (six) hours as needed for moderate pain.    . hydrOXYzine (ATARAX/VISTARIL) 25 MG tablet Take one tablet at bedtime for itching and for sleep    . ipratropium-albuterol (DUONEB) 0.5-2.5 (3) MG/3ML SOLN Take 3 mLs by nebulization every 6 (six) hours as needed (wheezes or SOB).    Marland Kitchen linagliptin (TRADJENTA) 5 MG TABS tablet Take 1 tablet (5 mg total) by mouth daily.    . metoprolol succinate (TOPROL-XL) 50 MG 24 hr tablet Take 1 tablet (50 mg total) by mouth 2 (two) times daily. Take with or immediately following a meal.    . montelukast (SINGULAIR) 10 MG tablet Take 1 tablet (10 mg total) by mouth at bedtime.    . mupirocin ointment (BACTROBAN) 2 % Apply 1 application topically daily. To left knee abrasion    . olmesartan (BENICAR) 40 MG tablet Take 1 tablet (40 mg total) by mouth daily.    . pantoprazole (PROTONIX) 40 MG tablet Take 1 tablet (40 mg total) by mouth daily.    . potassium chloride SA (K-DUR,KLOR-CON) 20 MEQ tablet Take 1.5 tablets (30 mEq total) by mouth daily.    . rosuvastatin10 MG tablet TAKE 1 TABLET BY MOUTH ONCE A DAY.     . solifenacin (VESICARE) 5 MG tablet Take 1 tablet (5 mg total) by mouth daily.    Marland Kitchen tiotropium18 MCG inhalation capsule Place 18  mcg into inhaler and inhale daily PRN SHORTNESS OF BREATH).     Marland Kitchen traZODone (DESYREL) 50 MG tablet TAKE (1) TABLET BY MOUTH AT BEDTIME.    Marland Kitchen        Review of Systems PER HPI OTHERWISE ALL SYSTEMS ARE NEGATIVE.    Objective:   Physical Exam  TELEPHONE VISIT DUE TO COVID 19, VISIT IS CONDUCTED VIRTUALLY AND WAS REQUESTED BY PATIENT.      Assessment & Plan:

## 2019-01-25 NOTE — Assessment & Plan Note (Addendum)
AVERAGE RISK-VISIT REQUIRED BECAUSE PT IS ON ELIQUIS.NO WARNING SIGNS/SYMPTOMS  PT NEEDS ADMISSION FOR COLONOSCOPY IN JUL 2020. NEED PHENERGAN 12.5 MG IV IN PREOP. 2 DAYS PRIOR TO COLONOSCOPY: HOLD ELIQUIS  START OF A FULL LIQUID DIET. ONE DAY BEFORE COLONOSCOPY: 1. FOLLOW A CLEAR LIQUID DIET. 2. START SUPREP BOWEL PREP.  ON THE DAY OF THE COLONOSCOPY: 1. HOLD TRADJENTA.  DISCUSSED PROCEDURE, BENEFITS, & RISKS: < 1% chance of medication reaction, bleeding, perforation, ASPIRATION, or rupture of spleen/liver requiring surgery to fix it and missed polyps < 1 cm 10-20% of the time. YOU DO NOT HAVE TO STOP ASA FOR THE COLONOSCOPY. REVIEWED EMR: LABS/IMAGING 2019 TO 2020 FOLLOW UP IN THE OFFICE WILL BE SCHEDULED IF NEEDED AFTER ENDOSCOPY

## 2019-01-25 NOTE — Patient Instructions (Addendum)
PT NEEDS ADMISSION FOR COLONOSCOPY IN JUL 2020.   2 DAYS PRIOR TO COLONOSCOPY: HOLD ELIQUIS  START OF A FULL LIQUID DIET. ONE DAY BEFORE COLONOSCOPY: 1. FOLLOW A CLEAR LIQUID DIET. 2. START SUPREP BOWEL PREP.   ON THE DAY OF THE COLONOSCOPY: 1. HOLD TRADJENTA. THERE IS NO NEED TO STOP ASPIRIN FOR YOUR COLONOSCOPY.    TO PREVENT ULCERS AND GASTRITIS DUE TO DAILY ASPIRIN USE, CONTINUE OMEPRAZOLE.  TAKE 30 MINUTES PRIOR TO YOUR FIRST MEAL.  FOLLOW UP IN THE OFFICE WILL BE SCHEDULED IF NEEDED AFTER ENDOSCOPY.

## 2019-01-26 ENCOUNTER — Other Ambulatory Visit: Payer: Self-pay

## 2019-01-26 ENCOUNTER — Ambulatory Visit (INDEPENDENT_AMBULATORY_CARE_PROVIDER_SITE_OTHER): Payer: Medicare Other | Admitting: Family Medicine

## 2019-01-26 ENCOUNTER — Telehealth: Payer: Self-pay

## 2019-01-26 VITALS — BP 160/100 | Ht 61.0 in | Wt 166.0 lb

## 2019-01-26 DIAGNOSIS — R29898 Other symptoms and signs involving the musculoskeletal system: Secondary | ICD-10-CM

## 2019-01-26 DIAGNOSIS — R296 Repeated falls: Secondary | ICD-10-CM

## 2019-01-26 DIAGNOSIS — E785 Hyperlipidemia, unspecified: Secondary | ICD-10-CM

## 2019-01-26 DIAGNOSIS — I10 Essential (primary) hypertension: Secondary | ICD-10-CM | POA: Diagnosis not present

## 2019-01-26 DIAGNOSIS — Z1231 Encounter for screening mammogram for malignant neoplasm of breast: Secondary | ICD-10-CM

## 2019-01-26 DIAGNOSIS — E1159 Type 2 diabetes mellitus with other circulatory complications: Secondary | ICD-10-CM

## 2019-01-26 NOTE — Telephone Encounter (Signed)
Received a call from [facility]. Pt was prescribed Omeprazole 20 mg yesterday and pt is already taking Pantoprazole 40 mg from another provider. Do you want to switch pt to the Omeprazole 20mg ?

## 2019-01-26 NOTE — Patient Instructions (Addendum)
F/U in office with blood pressure and lab re eval end May, cal if you need me sooner  Mammogram is scheduled, appointment info enclosed.  Non fasting  cmp and EGFr and HBA1C 1 week before May visit.  You are being referred for in home PT twice weekly through [facility]  for 6 weeks due to report of recurrent falls and unsteady gait  Prescription for wheelchair with bench is sent to [facility] your supplier  Social distancing. Frequent hand washing with soap and water Keeping your hands off of your face. These 3 practices will help to keep both you and your community healthy during this time. Please practice them faithfully!   Thanks for choosing [facility], we consider it a privelige to serve you.

## 2019-01-26 NOTE — Progress Notes (Signed)
Virtual Visit via Telephone Note  I connected with [name] on 01/26/19 at  1:00 PM EDT by telephone and verified that I am speaking with the correct person using two identifiers.   I discussed the limitations, risks, security and privacy concerns of performing an evaluation and management service by telephone and the availability of in person appointments. I also discussed with the patient that there may be a patient responsible charge related to this service. The patient expressed understanding and agreed to proceed. Pt in hr home and I am in my  House, visual contact with the patient desired but not possible   History of Present Illness: F/u chronic conditions  Still trying to get full medicaid, states  she brings in $100 over what the amount is before she can qualify, and without this , she does not qualify for in home asistance States she has a form for completion,patient states if she can get in home  PT , she will get some help short term, states she can walk when she gets out of wheelchair with the walker has had for 1 to 2 year, states she has fallen 2 times this year, most recent approx 1 month ago, legs are weak and both just give out is what se reports Has in home PT in August 2019, benefited from and needs this a she repeatedly falls  Denies polyuria, polydipsia, blurred vision , or hypoglycemic episodes. Does not test blood sugar Observations/Objective:  BP (!) 160/100   Ht 5\' 1"  (1.549 m) Comment: extrapolated from recent data  Wt 166 lb (75.3 kg)   LMP 05/19/2016   BMI 31.37 kg/m  Extrapolated from most recent actual physical checks, pt was unable to weigh at home Assessment and Plan: HTN (hypertension), malignant Uncontrolled when checked most recently, repoorts med compliance, f/u in 6 weeks in office for re eval DASH diet and commitment to daily physical activity for a minimum of 30 minutes discussed and encouraged, as a part of hypertension management. The  importance of attaining a healthy weight is also discussed.  BP/Weight 01/26/2019 12/19/2018 12/07/2018 11/26/2018 11/22/2018 10/15/2018 52/84/1324  Systolic BP 401 - 027 253 - 664 -  Diastolic BP 403 - 474 259 - 101 -  Wt. (Lbs) 166 166 166.04 - 172 - 171.96  BMI 31.37 31.37 31.37 - 32.5 - -  Some encounter information is confidential and restricted. Go to Review Flowsheets activity to see all data.       Type 2 diabetes mellitus with vascular disease ([place]) Updated lab needed at/ before next visit. [name] is reminded of the importance of commitment to daily physical activity for 30 minutes or more, as able and the need to limit carbohydrate intake to 30 to 60 grams per meal to help with blood sugar control.   The need to take medication as prescribed, test blood sugar as directed, and to call between visits if there is a concern that blood sugar is uncontrolled is also discussed.   [name] is reminded of the importance of daily foot exam, annual eye examination, and good blood sugar, blood pressure and cholesterol control.  Diabetic Labs Latest Ref Rng & Units 12/07/2018 11/25/2018 11/24/2018 11/23/2018 10/13/2018  HbA1c 4.8 - 5.6 % - - - 7.0(H) -  Microalbumin Not estab mg/dL - - - - -  Micro/Creat Ratio <30 mcg/mg creat - - - - -  Chol <200 mg/dL 144 - - - -  HDL > OR = 50 mg/dL 55 - - - -  Calc LDL mg/dL (calc) 73 - - - -  Triglycerides <150 mg/dL 79 - - - -  Creatinine 0.44 - 1.00 mg/dL - 1.41(H) 1.48(H) 1.34(H) 1.44(H)  GFR >60.00 mL/min - - - - -   BP/Weight 01/26/2019 12/19/2018 12/07/2018 11/26/2018 11/22/2018 10/15/2018 86/76/1950  Systolic BP 932 - 671 245 - 809 -  Diastolic BP 983 - 382 505 - 101 -  Wt. (Lbs) 166 166 166.04 - 172 - 171.96  BMI 31.37 31.37 31.37 - 32.5 - -  Some encounter information is confidential and restricted. Go to Review Flowsheets activity to see all data.   Foot/eye exam completion dates Latest Ref Rng & Units 07/26/2018 03/16/2017  Eye Exam No  Retinopathy - Retinopathy(A)  Foot exam Order - - -  Foot Form Completion - Done -        Hyperlipidemia LDL goal <70 Hyperlipidemia:Low fat diet discussed and encouraged.   Lipid Panel  Lab Results  Component Value Date   CHOL 144 12/07/2018   HDL 55 12/07/2018   LDLCALC 73 12/07/2018   LDLDIRECT 122 (H) 01/08/2010   TRIG 79 12/07/2018   CHOLHDL 2.6 12/07/2018   Controlled, no change in medication     Lower extremity weakness Recurrent falls reported 2 in past 3 months , in her home. Walker dependent for safe mobility in her home. Needs in home PT for 6 weeks due to frequent falls, also needs walker with seat , will request both  Recurrent falls while walking Reports 2 falls in her home, states legs are weak and give out. Needs in home PT twice weekly for 6 weeks Home safety reviewed during visit also    Follow Up Instructions:    I discussed the assessment and treatment plan with the patient. The patient was provided an opportunity to ask questions and all were answered. The patient agreed with the plan and demonstrated an understanding of the instructions.   The patient was advised to call back or seek an in-person evaluation if the symptoms worsen or if the condition fails to improve as anticipated.  I provided 25 minutes of non-face-to-face time during this encounter.   Tula Nakayama, MD

## 2019-01-27 ENCOUNTER — Encounter: Payer: Self-pay | Admitting: Family Medicine

## 2019-01-27 DIAGNOSIS — R296 Repeated falls: Secondary | ICD-10-CM | POA: Insufficient documentation

## 2019-01-27 NOTE — Assessment & Plan Note (Signed)
Uncontrolled when checked most recently, repoorts med compliance, f/u in 6 weeks in office for re eval DASH diet and commitment to daily physical activity for a minimum of 30 minutes discussed and encouraged, as a part of hypertension management. The importance of attaining a healthy weight is also discussed.  BP/Weight 01/26/2019 12/19/2018 12/07/2018 11/26/2018 11/22/2018 10/15/2018 75/02/1070  Systolic BP 252 - 479 980 - 012 -  Diastolic BP 393 - 594 090 - 101 -  Wt. (Lbs) 166 166 166.04 - 172 - 171.96  BMI 31.37 31.37 31.37 - 32.5 - -  Some encounter information is confidential and restricted. Go to Review Flowsheets activity to see all data.

## 2019-01-27 NOTE — Assessment & Plan Note (Signed)
Updated lab needed at/ before next visit. [name] is reminded of the importance of commitment to daily physical activity for 30 minutes or more, as able and the need to limit carbohydrate intake to 30 to 60 grams per meal to help with blood sugar control.   The need to take medication as prescribed, test blood sugar as directed, and to call between visits if there is a concern that blood sugar is uncontrolled is also discussed.   [name] is reminded of the importance of daily foot exam, annual eye examination, and good blood sugar, blood pressure and cholesterol control.  Diabetic Labs Latest Ref Rng & Units 12/07/2018 11/25/2018 11/24/2018 11/23/2018 10/13/2018  HbA1c 4.8 - 5.6 % - - - 7.0(H) -  Microalbumin Not estab mg/dL - - - - -  Micro/Creat Ratio <30 mcg/mg creat - - - - -  Chol <200 mg/dL 144 - - - -  HDL > OR = 50 mg/dL 55 - - - -  Calc LDL mg/dL (calc) 73 - - - -  Triglycerides <150 mg/dL 79 - - - -  Creatinine 0.44 - 1.00 mg/dL - 1.41(H) 1.48(H) 1.34(H) 1.44(H)  GFR >60.00 mL/min - - - - -   BP/Weight 01/26/2019 12/19/2018 12/07/2018 11/26/2018 11/22/2018 10/15/2018 42/68/3419  Systolic BP 622 - 297 989 - 211 -  Diastolic BP 941 - 740 814 - 101 -  Wt. (Lbs) 166 166 166.04 - 172 - 171.96  BMI 31.37 31.37 31.37 - 32.5 - -  Some encounter information is confidential and restricted. Go to Review Flowsheets activity to see all data.   Foot/eye exam completion dates Latest Ref Rng & Units 07/26/2018 03/16/2017  Eye Exam No Retinopathy - Retinopathy(A)  Foot exam Order - - -  Foot Form Completion - Done -

## 2019-01-27 NOTE — Addendum Note (Signed)
Addended by: Eual Fines on: 01/27/2019 11:17 AM   Modules accepted: Orders

## 2019-01-27 NOTE — Telephone Encounter (Signed)
PLEASE CALL PT. She was unaware she was taking PANTOPRAZOLE. CONFIRM WITH PT THAT SHE IS TAKING EITHER OMEPRAZOLE OR PANTOPRAZOLE.

## 2019-01-27 NOTE — Assessment & Plan Note (Signed)
Reports 2 falls in her home, states legs are weak and give out. Needs in home PT twice weekly for 6 weeks Home safety reviewed during visit also

## 2019-01-27 NOTE — Assessment & Plan Note (Signed)
Recurrent falls reported 2 in past 3 months , in her home. Walker dependent for safe mobility in her home. Needs in home PT for 6 weeks due to frequent falls, also needs walker with seat , will request both

## 2019-01-27 NOTE — Assessment & Plan Note (Signed)
Hyperlipidemia:Low fat diet discussed and encouraged.   Lipid Panel  Lab Results  Component Value Date   CHOL 144 12/07/2018   HDL 55 12/07/2018   LDLCALC 73 12/07/2018   LDLDIRECT 122 (H) 01/08/2010   TRIG 79 12/07/2018   CHOLHDL 2.6 12/07/2018   Controlled, no change in medication

## 2019-01-30 NOTE — Telephone Encounter (Signed)
Spoke with pt. She decided to keep her medication the way it is. She will take Pantoprazole 40 mg, [facility] is aware.

## 2019-02-01 ENCOUNTER — Telehealth: Payer: Self-pay

## 2019-02-01 NOTE — Telephone Encounter (Signed)
Called pt, TCS w/SL[facility] scheduled for 05/10/19. She will be admitted 05/09/19 for assistance with bowel prep. Pt aware she will need to arrive at approx 8:00am but bed placement will call her when bed is ready. Instructions mailed.  Endo scheduler informed. Procedure at 1:00pm per endo scheduler.

## 2019-02-03 ENCOUNTER — Other Ambulatory Visit: Payer: Self-pay | Admitting: Family Medicine

## 2019-02-08 ENCOUNTER — Other Ambulatory Visit: Payer: Self-pay | Admitting: Family Medicine

## 2019-02-08 ENCOUNTER — Other Ambulatory Visit (INDEPENDENT_AMBULATORY_CARE_PROVIDER_SITE_OTHER): Payer: Self-pay | Admitting: Orthopedic Surgery

## 2019-02-11 DIAGNOSIS — I509 Heart failure, unspecified: Secondary | ICD-10-CM | POA: Diagnosis not present

## 2019-02-11 DIAGNOSIS — R062 Wheezing: Secondary | ICD-10-CM | POA: Diagnosis not present

## 2019-02-13 ENCOUNTER — Ambulatory Visit: Payer: Self-pay

## 2019-02-14 NOTE — Telephone Encounter (Signed)
Pt called office, she will pick up sample prep prior to admission.

## 2019-02-14 NOTE — Telephone Encounter (Signed)
[facility] wants pt to have Suprep for TCS. Pt will need to come by office to pick up Suprep sample and take prep with her to admission. Tried to call pt, no answer, unable to leave voicemail.

## 2019-02-21 ENCOUNTER — Encounter: Payer: Self-pay | Admitting: Family Medicine

## 2019-02-21 ENCOUNTER — Ambulatory Visit (INDEPENDENT_AMBULATORY_CARE_PROVIDER_SITE_OTHER): Payer: Medicare Other | Admitting: Family Medicine

## 2019-02-21 ENCOUNTER — Other Ambulatory Visit: Payer: Self-pay

## 2019-02-21 DIAGNOSIS — I872 Venous insufficiency (chronic) (peripheral): Secondary | ICD-10-CM | POA: Diagnosis not present

## 2019-02-21 DIAGNOSIS — Z7902 Long term (current) use of antithrombotics/antiplatelets: Secondary | ICD-10-CM | POA: Diagnosis not present

## 2019-02-21 DIAGNOSIS — Z0000 Encounter for general adult medical examination without abnormal findings: Secondary | ICD-10-CM

## 2019-02-21 DIAGNOSIS — Z7984 Long term (current) use of oral hypoglycemic drugs: Secondary | ICD-10-CM | POA: Diagnosis not present

## 2019-02-21 DIAGNOSIS — I10 Essential (primary) hypertension: Secondary | ICD-10-CM | POA: Diagnosis not present

## 2019-02-21 DIAGNOSIS — R296 Repeated falls: Secondary | ICD-10-CM | POA: Diagnosis not present

## 2019-02-21 DIAGNOSIS — M6281 Muscle weakness (generalized): Secondary | ICD-10-CM | POA: Diagnosis not present

## 2019-02-21 DIAGNOSIS — Z9181 History of falling: Secondary | ICD-10-CM | POA: Diagnosis not present

## 2019-02-21 DIAGNOSIS — E1151 Type 2 diabetes mellitus with diabetic peripheral angiopathy without gangrene: Secondary | ICD-10-CM | POA: Diagnosis not present

## 2019-02-21 NOTE — Patient Instructions (Signed)
[name] , Thank you for taking time to come for your Medicare Wellness Visit. I appreciate your ongoing commitment to your health goals. Please review the following plan we discussed and let me know if I can assist you in the future.   Screening recommendations/referrals: Colonoscopy: July 2020 Mammogram: Due 2020 Recommended yearly ophthalmology/optometry visit for glaucoma screening and checkup Recommended yearly dental visit for hygiene and checkup  Vaccinations: Influenza vaccine: Due in Oct 2020 Pneumococcal vaccine: Discuss with Dr Moshe Cipro Tdap vaccine: Up to date Shingles vaccine: Discuss time with Dr Moshe Cipro   Preventive Care 40-64 Years, Female Preventive care refers to lifestyle choices and visits with your health care provider that can promote health and wellness. What does preventive care include?  A yearly physical exam. This is also called an annual well check.  Dental exams once or twice a year.  Routine eye exams. Ask your health care provider how often you should have your eyes checked.  Personal lifestyle choices, including:  Daily care of your teeth and gums.  Regular physical activity.  Eating a healthy diet.  Avoiding tobacco and drug use.  Limiting alcohol use.  Practicing safe sex.  Taking low-dose aspirin daily starting at age 58.  Taking vitamin and mineral supplements as recommended by your health care provider. What happens during an annual well check? The services and screenings done by your health care provider during your annual well check will depend on your age, overall health, lifestyle risk factors, and family history of disease. Counseling  Your health care provider may ask you questions about your:  Alcohol use.  Tobacco use.  Drug use.  Emotional well-being.  Home and relationship well-being.  Sexual activity.  Eating habits.  Work and work Statistician.  Method of birth control.  Menstrual cycle.  Pregnancy  history. Screening  You may have the following tests or measurements:  Height, weight, and BMI.  Blood pressure.  Lipid and cholesterol levels. These may be checked every 5 years, or more frequently if you are over 33 years old.  Skin check.  Lung cancer screening. You may have this screening every year starting at age 63 if you have a 30-pack-year history of smoking and currently smoke or have quit within the past 15 years.  Fecal occult blood test (FOBT) of the stool. You may have this test every year starting at age 73.  Flexible sigmoidoscopy or colonoscopy. You may have a sigmoidoscopy every 5 years or a colonoscopy every 10 years starting at age 51.  Hepatitis C blood test.  Hepatitis B blood test.  Sexually transmitted disease (STD) testing.  Diabetes screening. This is done by checking your blood sugar (glucose) after you have not eaten for a while (fasting). You may have this done every 1-3 years.  Mammogram. This may be done every 1-2 years. Talk to your health care provider about when you should start having regular mammograms. This may depend on whether you have a family history of breast cancer.  BRCA-related cancer screening. This may be done if you have a family history of breast, ovarian, tubal, or peritoneal cancers.  Pelvic exam and Pap test. This may be done every 3 years starting at age 42. Starting at age 70, this may be done every 5 years if you have a Pap test in combination with an HPV test.  Bone density scan. This is done to screen for osteoporosis. You may have this scan if you are at high risk for osteoporosis. Discuss your  test results, treatment options, and if necessary, the need for more tests with your health care provider. Vaccines  Your health care provider may recommend certain vaccines, such as:  Influenza vaccine. This is recommended every year.  Tetanus, diphtheria, and acellular pertussis (Tdap, Td) vaccine. You may need a Td booster  every 10 years.  Zoster vaccine. You may need this after age 58.  Pneumococcal 13-valent conjugate (PCV13) vaccine. You may need this if you have certain conditions and were not previously vaccinated.  Pneumococcal polysaccharide (PPSV23) vaccine. You may need one or two doses if you smoke cigarettes or if you have certain conditions. Talk to your health care provider about which screenings and vaccines you need and how often you need them. This information is not intended to replace advice given to you by your health care provider. Make sure you discuss any questions you have with your health care provider. Document Released: 11/08/2015 Document Revised: 07/01/2016 Document Reviewed: 08/13/2015 Elsevier Interactive Patient Education  2017 [employer] Prevention in the Home Falls can cause injuries. They can happen to people of all ages. There are many things you can do to make your home safe and to help prevent falls. What can I do on the outside of my home?  Regularly fix the edges of walkways and driveways and fix any cracks.  Remove anything that might make you trip as you walk through a door, such as a raised step or threshold.  Trim any bushes or trees on the path to your home.  Use bright outdoor lighting.  Clear any walking paths of anything that might make someone trip, such as rocks or tools.  Regularly check to see if handrails are loose or broken. Make sure that both sides of any steps have handrails.  Any raised decks and porches should have guardrails on the edges.  Have any leaves, snow, or ice cleared regularly.  Use sand or salt on walking paths during winter.  Clean up any spills in your garage right away. This includes oil or grease spills. What can I do in the bathroom?  Use night lights.  Install grab bars by the toilet and in the tub and shower. Do not use towel bars as grab bars.  Use non-skid mats or decals in the tub or shower.  If you  need to sit down in the shower, use a plastic, non-slip stool.  Keep the floor dry. Clean up any water that spills on the floor as soon as it happens.  Remove soap buildup in the tub or shower regularly.  Attach bath mats securely with double-sided non-slip rug tape.  Do not have throw rugs and other things on the floor that can make you trip. What can I do in the bedroom?  Use night lights.  Make sure that you have a light by your bed that is easy to reach.  Do not use any sheets or blankets that are too big for your bed. They should not hang down onto the floor.  Have a firm chair that has side arms. You can use this for support while you get dressed.  Do not have throw rugs and other things on the floor that can make you trip. What can I do in the kitchen?  Clean up any spills right away.  Avoid walking on wet floors.  Keep items that you use a lot in easy-to-reach places.  If you need to reach something above you, use a strong  step stool that has a grab bar.  Keep electrical cords out of the way.  Do not use floor polish or wax that makes floors slippery. If you must use wax, use non-skid floor wax.  Do not have throw rugs and other things on the floor that can make you trip. What can I do with my stairs?  Do not leave any items on the stairs.  Make sure that there are handrails on both sides of the stairs and use them. Fix handrails that are broken or loose. Make sure that handrails are as long as the stairways.  Check any carpeting to make sure that it is firmly attached to the stairs. Fix any carpet that is loose or worn.  Avoid having throw rugs at the top or bottom of the stairs. If you do have throw rugs, attach them to the floor with carpet tape.  Make sure that you have a light switch at the top of the stairs and the bottom of the stairs. If you do not have them, ask someone to add them for you. What else can I do to help prevent falls?  Wear shoes that:   Do not have high heels.  Have rubber bottoms.  Are comfortable and fit you well.  Are closed at the toe. Do not wear sandals.  If you use a stepladder:  Make sure that it is fully opened. Do not climb a closed stepladder.  Make sure that both sides of the stepladder are locked into place.  Ask someone to hold it for you, if possible.  Clearly mark and make sure that you can see:  Any grab bars or handrails.  First and last steps.  Where the edge of each step is.  Use tools that help you move around (mobility aids) if they are needed. These include:  Canes.  Walkers.  Scooters.  Crutches.  Turn on the lights when you go into a dark area. Replace any light bulbs as soon as they burn out.  Set up your furniture so you have a clear path. Avoid moving your furniture around.  If any of your floors are uneven, fix them.  If there are any pets around you, be aware of where they are.  Review your medicines with your doctor. Some medicines can make you feel dizzy. This can increase your chance of falling. Ask your doctor what other things that you can do to help prevent falls. This information is not intended to replace advice given to you by your health care provider. Make sure you discuss any questions you have with your health care provider. Document Released: 08/08/2009 Document Revised: 03/19/2016 Document Reviewed: 11/16/2014 Elsevier Interactive Patient Education  2017 [employer].

## 2019-02-21 NOTE — Progress Notes (Signed)
Subjective:   [name] is a 53 y.o. female who presents for Medicare Annual (Subsequent) preventive examination.   Location of Patient: Home Location of Provider: Telehealth Consent was obtain for visit to be over via telehealth. I verified that I am speaking with the correct person using two identifiers.  Review of Systems:   Cardiac Risk Factors include: Other (see comment), Risk factor comments: orthopedic conditions     Objective:     Vitals: LMP 05/19/2016   There is no height or weight on file to calculate BMI.  Advanced Directives 11/23/2018 11/23/2018 10/13/2018 10/11/2018 07/12/2018 07/11/2018 06/04/2018  Does Patient Have a Medical Advance Directive? No No No No No No No  Type of Advance Directive - - - - - - -  Does patient want to make changes to medical advance directive? - - - - - - -  Would patient like information on creating a medical advance directive? No - Patient declined No - Patient declined No - Patient declined No - Patient declined No - Patient declined No - Patient declined No - Patient declined  Pre-existing out of facility DNR order (yellow form or pink MOST form) - - - - - - -  Some encounter information is confidential and restricted. Go to Review Flowsheets activity to see all data.    Tobacco Social History   Tobacco Use  Smoking Status Former Smoker  . Packs/day: 1.00  . Years: 20.00  . Pack years: 20.00  . Types: Cigarettes  . Last attempt to quit: 06/03/2017  . Years since quitting: 1.7  Smokeless Tobacco Never Used     Counseling given: Not Answered   Clinical Intake:  Pre-visit preparation completed: Yes  Pain : No/denies pain     Nutritional Risks: None Diabetes: Yes CBG done?: No Did pt. bring in CBG monitor from home?: No  How often do you need to have someone help you when you read instructions, pamphlets, or other written materials from your doctor or pharmacy?: 1 - Never What is the last grade level you completed  in school?: 9  Interpreter Needed?: No     Past Medical History:  Diagnosis Date  . Alcohol use   . Ankle fracture, lateral malleolus, closed 12/30/2011  . Anxiety   . Breast mass, left 12/15/2011  . Chronic anemia   . Chronic combined systolic and diastolic CHF (congestive heart failure) ([place])   . CKD (chronic kidney disease), stage III ([place])    pt denies  . Cocaine abuse ([place])   . COPD (chronic obstructive pulmonary disease) ([place])   . Diabetes mellitus, type 2 ([place])   . Diabetic Charcot foot ([place]) 12/30/2011  . Essential hypertension   . History of GI bleed 12/05/2009   Qualifier: Diagnosis of  By: Zeb Comfort    . Hyperlipidemia   . Hypertensive cardiomyopathy ([place]) 11/08/2012   a. 05/2016: echo showing EF of 20-25% with normal cors by cath in 07/2016.  . Morbid obesity ([place]) 01/31/2008   Qualifier: Diagnosis of  By: Lenn Cal    . Noncompliance with medication regimen   . Obesity   . Panic attacks   . PAT (paroxysmal atrial tachycardia) ([place])    a. started on amiodarone 07/2017 for this.  . Sleep apnea    does not use cpap  . Stroke ([place])    06/24/17  . Tobacco abuse   . Urinary incontinence    Past Surgical History:  Procedure Laterality Date  . BREAST BIOPSY    .  CARDIAC CATHETERIZATION N/A 07/28/2016   Procedure: Left Heart Cath and Coronary Angiography;  Surgeon: Jettie Booze, MD;  Location: [facility];  Service: Cardiovascular;  Laterality: N/A;  . DILATION AND CURETTAGE OF UTERUS    . I&D EXTREMITY Bilateral 09/22/2017   Procedure: BILATERAL DEBRIDEMENT LEG/FOOT ULCERS, APPLY VERAFLO WOUND VAC;  Surgeon: Newt Minion, MD;  Location: [facility];  Service: Orthopedics;  Laterality: Bilateral;  . I&D EXTREMITY Right 10/11/2018   Procedure: IRRIGATION AND DEBRIDEMENT RIGHT HAND;  Surgeon: [name], MD;  Location: [facility];  Service: Orthopedics;  Laterality: Right;  . I&D EXTREMITY Right 10/13/2018   Procedure: REPEAT IRRIGATION AND DEBRIDEMENT  RIGHT HAND;  Surgeon: [name], MD;  Location: [facility];  Service: Orthopedics;  Laterality: Right;  . I&D EXTREMITY Right 11/22/2018   Procedure: IRRIGATION AND DEBRIDEMENT AND PINNING RIGHT HAND;  Surgeon: [name], MD;  Location: [facility];  Service: Orthopedics;  Laterality: Right;  . IR RADIOLOGIST EVAL & MGMT  07/05/2018  . SKIN SPLIT GRAFT Bilateral 09/28/2017   Procedure: BILATERAL SPLIT THICKNESS SKIN GRAFT LEGS/FEET AND APPLY VAC;  Surgeon: Newt Minion, MD;  Location: [facility];  Service: Orthopedics;  Laterality: Bilateral;  . SKIN SPLIT GRAFT Right 11/22/2018   Procedure: SKIN GRAFT SPLIT THICKNESS;  Surgeon: [name], MD;  Location: [facility];  Service: Orthopedics;  Laterality: Right;   Family History  Problem Relation Age of Onset  . Hypertension Mother   . Heart attack Mother   . Hypertension Father        CABG   . Hypertension Sister   . Hypertension Brother   . Hypertension Sister   . Cancer Sister        breast   . Arthritis Other   . Cancer Other   . Diabetes Other   . Asthma Other   . Hypertension Daughter   . Hypertension Son    Social History   Socioeconomic History  . Marital status: Widowed    Spouse name: Not on file  . Number of children: 2  . Years of education: Not on file  . Highest education level: Not on file  Occupational History  . Not on file  Social Needs  . Financial resource strain: Not hard at all  . Food insecurity:    Worry: Never true    Inability: Never true  . Transportation needs:    Medical: No    Non-medical: No  Tobacco Use  . Smoking status: Former Smoker    Packs/day: 1.00    Years: 20.00    Pack years: 20.00    Types: Cigarettes    Last attempt to quit: 06/03/2017    Years since quitting: 1.7  . Smokeless tobacco: Never Used  Substance and Sexual Activity  . Alcohol use: No    Alcohol/week: 0.0 standard drinks    Comment: Quit in 2017  . Drug use: Not Currently    Frequency: 3.0 times per week    Types:  Marijuana, Cocaine    Comment: none since August 2018  . Sexual activity: Not Currently    Birth control/protection: None  Lifestyle  . Physical activity:    Days per week: 7 days    Minutes per session: 60 min  . Stress: Not at all  Relationships  . Social connections:    Talks on phone: More than three times a week    Gets together: More than three times a week    Attends religious  service: More than 4 times per year    Active member of club or organization: Yes    Attends meetings of clubs or organizations: More than 4 times per year    Relationship status: Widowed  Other Topics Concern  . Not on file  Social History Narrative   Now has an apartment and lives with her dog. Is going to church and stopped all the "bad" stuff. Is "living for the [place]". Doing much better. Has a great outlook on life.Is much happier now.     Outpatient Encounter Medications as of 02/21/2019  Medication Sig  . albuterol (PROAIR HFA) 108 (90 Base) MCG/ACT inhaler INHALE 2 PUFFS EVERY 6 HOURS AS NEEDED FOR SHORTNESS OF BREATH/WHEEZING. (Patient taking differently: Inhale 2 puffs into the lungs every 6 (six) hours as needed for wheezing or shortness of breath. )  . amiodarone (PACERONE) 200 MG tablet Take 1 tablet (200 mg total) by mouth 2 (two) times daily.  Marland Kitchen amLODipine (NORVASC) 10 MG tablet TAKE 1 TABLET BY MOUTH ONCE A DAY.  Marland Kitchen aspirin EC 81 MG tablet Take 1 tablet (81 mg total) by mouth daily.  . cloNIDine (CATAPRES) 0.3 MG tablet TAKE 1 TABLET BY MOUTH AT BEDTIME FOR BLOOD PRESSURE  . DULoxetine (CYMBALTA) 30 MG capsule Take 60 mg by mouth daily.   Marland Kitchen ezetimibe (ZETIA) 10 MG tablet Take 1 tablet (10 mg total) by mouth daily.  . furosemide (LASIX) 20 MG tablet TAKE 1 TABLET BY MOUTH ONCE A DAY.  Marland Kitchen gabapentin (NEURONTIN) 300 MG capsule TAKE 1 CAPSULE BY MOUTH THREE TIMES A DAY.  Marland Kitchen HYDROcodone-acetaminophen (NORCO) 5-325 MG tablet Take 1 tablet by mouth every 6 (six) hours as needed for moderate pain.   . hydrOXYzine (ATARAX/VISTARIL) 25 MG tablet Take one tablet at bedtime for itching and for sleep  . ipratropium-albuterol (DUONEB) 0.5-2.5 (3) MG/3ML SOLN Take 3 mLs by nebulization every 6 (six) hours as needed (wheezes or SOB).  Marland Kitchen linagliptin (TRADJENTA) 5 MG TABS tablet Take 1 tablet (5 mg total) by mouth daily.  . metoprolol succinate (TOPROL-XL) 50 MG 24 hr tablet Take 1 tablet (50 mg total) by mouth 2 (two) times daily. Take with or immediately following a meal.  . montelukast (SINGULAIR) 10 MG tablet Take 1 tablet (10 mg total) by mouth at bedtime.  . mupirocin ointment (BACTROBAN) 2 % Apply 1 application topically daily. To left knee abrasion  . olmesartan (BENICAR) 40 MG tablet Take 1 tablet (40 mg total) by mouth daily.  Marland Kitchen omeprazole (PRILOSEC) 20 MG capsule 1 PO 30 MINS PRIOR TO BREAKFAST.  . pantoprazole (PROTONIX) 40 MG tablet Take 1 tablet (40 mg total) by mouth daily.  . potassium chloride SA (K-DUR,KLOR-CON) 20 MEQ tablet Take 1.5 tablets (30 mEq total) by mouth daily.  . rosuvastatin (CRESTOR) 10 MG tablet TAKE 1 TABLET BY MOUTH ONCE A DAY. (Patient taking differently: Take 10 mg by mouth every evening. )  . solifenacin (VESICARE) 5 MG tablet Take 1 tablet (5 mg total) by mouth daily.  Marland Kitchen tiotropium (SPIRIVA HANDIHALER) 18 MCG inhalation capsule Place 18 mcg into inhaler and inhale daily as needed (SHORTNESS OF BREATH).   Marland Kitchen traZODone (DESYREL) 50 MG tablet TAKE (1) TABLET BY MOUTH AT BEDTIME.  Marland Kitchen apixaban (ELIQUIS) 5 MG TABS tablet Take 1 tablet (5 mg total) by mouth 2 (two) times daily for 30 days.   No facility-administered encounter medications on file as of 02/21/2019.     Activities of Daily Living In  your present state of health, do you have any difficulty performing the following activities: 02/21/2019 11/23/2018  Hearing? N N  Vision? Y N  Difficulty concentrating or making decisions? N N  Walking or climbing stairs? Y Y  Dressing or bathing? Y N  Doing errands,  shopping? [name]  Preparing Food and eating ? Y -  Using the Toilet? Y -  In the past six months, have you accidently leaked urine? Y -  Do you have problems with loss of bowel control? Y -  Managing your Medications? N -  Managing your Finances? N -  Housekeeping or managing your Housekeeping? Y -  Some recent data might be hidden    Patient Care Team: [name], MD as PCP - General Herminio Commons, MD as PCP - Cardiology (Cardiology) Madelin Headings, DO (Optometry) Garwin Brothers, MD as Physician Assistant (Internal Medicine) [name], MD as Consulting Physician (Orthopedic Surgery)    Assessment:   This is a routine wellness examination for [place].  Exercise Activities and Dietary recommendations Current Exercise Habits: The patient does not participate in regular exercise at present, Exercise limited by: orthopedic condition(s)  Goals    . <enter goal here>     "my main problems are my breathing and my diabetes"    . Exercise 3x per week (30 min per time)     Recommend exercising 3 times a week for at least 30 minutes at a time.        Fall Risk Fall Risk  02/21/2019 01/27/2019 01/26/2019 12/07/2018 12/07/2018  Falls in the past year? 0 1 0 1 0  Number falls in past yr: - 1 0 1 -  Injury with Fall? 0 1 0 1 0  Risk for fall due to : - History of fall(s);Impaired balance/gait;Impaired mobility - History of fall(s);Impaired balance/gait -  Follow up - - - - -   Is the patient's home free of loose throw rugs in walkways, pet beds, electrical cords, etc?   yes      Grab bars in the bathroom? yes      Handrails on the stairs?   no      Adequate lighting?   yes  Timed Get Up and Go performed: unable to assess telephone visit  Depression Screen PHQ 2/9 Scores 02/21/2019 09/05/2018 07/26/2018 07/26/2018  PHQ - 2 Score 3 2 6  0  PHQ- 9 Score 18 4 21  -     Cognitive Function     6CIT Screen 02/21/2019 02/16/2018 12/15/2016  What Year? 0 points 0 points 0 points   What month? 0 points 0 points 0 points  What time? 0 points 0 points 0 points  Count back from 20 0 points 0 points 0 points  Months in reverse 0 points 0 points 0 points  Repeat phrase 0 points 0 points 0 points  Total Score 0 0 0    Immunization History  Administered Date(s) Administered  . H1N1 11/07/2008  . Influenza Split 07/12/2012  . Influenza Whole 07/03/2007, 07/13/2007, 07/24/2008, 09/26/2009, 07/17/2010  . Influenza,inj,Quad PF,6+ Mos 08/28/2013, 09/10/2014, 09/03/2015, 08/31/2016, 07/26/2018  . Pneumococcal Polysaccharide-23 08/25/2006, 11/12/2012  . Td 09/08/2005  . Tdap 03/15/2012    Qualifies for Shingles Vaccine? Will discuss with Dr Moshe Cipro when ready.  Screening Tests Health Maintenance  Topic Date Due  . OPHTHALMOLOGY EXAM  03/16/2018  . COLONOSCOPY  11/26/2019 (Originally 08/23/2016)  . PAP SMEAR-Modifier  05/08/2019  . HEMOGLOBIN A1C  05/24/2019  . INFLUENZA  VACCINE  05/27/2019  . FOOT EXAM  09/06/2019  . MAMMOGRAM  02/18/2020  . TETANUS/TDAP  03/15/2022  . PNEUMOCOCCAL POLYSACCHARIDE VACCINE AGE 19-64 HIGH RISK  Completed  . Hepatitis C Screening  Completed  . HIV Screening  Completed    Cancer Screenings: Lung: Low Dose CT Chest recommended if Age 2-80 years, 30 pack-year currently smoking OR have quit w/in 15years. Patient does not qualify. Breast:  Up to date on Mammogram? Yes   Up to date of Bone Density/Dexa? Dr Moshe Cipro will discuss when it is time Colorectal: July 2020  Additional Screenings: : Hepatitis C Screening: completed     Plan:  1. Encounter for Medicare annual wellness exam  I have personally reviewed and noted the following in the patient's chart:   . Medical and social history . Use of alcohol, tobacco or illicit drugs  . Current medications and supplements . Functional ability and status . Nutritional status . Physical activity . Advanced directives . List of other physicians . Hospitalizations, surgeries, and ER  visits in previous 12 months . Vitals . Screenings to include cognitive, depression, and falls . Referrals and appointments  In addition, I have reviewed and discussed with patient certain preventive protocols, quality metrics, and best practice recommendations. A written personalized care plan for preventive services as well as general preventive health recommendations were provided to patient.   I provided 20 minutes of non-face-to-face time during this encounter.  Perlie Mayo, NP  02/21/2019

## 2019-02-23 DIAGNOSIS — M6281 Muscle weakness (generalized): Secondary | ICD-10-CM | POA: Diagnosis not present

## 2019-02-23 DIAGNOSIS — Z9181 History of falling: Secondary | ICD-10-CM | POA: Diagnosis not present

## 2019-02-23 DIAGNOSIS — Z7902 Long term (current) use of antithrombotics/antiplatelets: Secondary | ICD-10-CM | POA: Diagnosis not present

## 2019-02-23 DIAGNOSIS — Z7984 Long term (current) use of oral hypoglycemic drugs: Secondary | ICD-10-CM | POA: Diagnosis not present

## 2019-02-23 DIAGNOSIS — R296 Repeated falls: Secondary | ICD-10-CM | POA: Diagnosis not present

## 2019-02-23 DIAGNOSIS — E1151 Type 2 diabetes mellitus with diabetic peripheral angiopathy without gangrene: Secondary | ICD-10-CM | POA: Diagnosis not present

## 2019-02-23 DIAGNOSIS — I872 Venous insufficiency (chronic) (peripheral): Secondary | ICD-10-CM | POA: Diagnosis not present

## 2019-02-23 DIAGNOSIS — I10 Essential (primary) hypertension: Secondary | ICD-10-CM | POA: Diagnosis not present

## 2019-02-24 DIAGNOSIS — I10 Essential (primary) hypertension: Secondary | ICD-10-CM | POA: Diagnosis not present

## 2019-02-24 DIAGNOSIS — Z7902 Long term (current) use of antithrombotics/antiplatelets: Secondary | ICD-10-CM | POA: Diagnosis not present

## 2019-02-24 DIAGNOSIS — R296 Repeated falls: Secondary | ICD-10-CM | POA: Diagnosis not present

## 2019-02-24 DIAGNOSIS — Z9181 History of falling: Secondary | ICD-10-CM | POA: Diagnosis not present

## 2019-02-24 DIAGNOSIS — I872 Venous insufficiency (chronic) (peripheral): Secondary | ICD-10-CM | POA: Diagnosis not present

## 2019-02-24 DIAGNOSIS — E1151 Type 2 diabetes mellitus with diabetic peripheral angiopathy without gangrene: Secondary | ICD-10-CM | POA: Diagnosis not present

## 2019-02-24 DIAGNOSIS — Z7984 Long term (current) use of oral hypoglycemic drugs: Secondary | ICD-10-CM | POA: Diagnosis not present

## 2019-02-24 DIAGNOSIS — M6281 Muscle weakness (generalized): Secondary | ICD-10-CM | POA: Diagnosis not present

## 2019-03-01 ENCOUNTER — Ambulatory Visit (HOSPITAL_COMMUNITY): Payer: Self-pay

## 2019-03-02 DIAGNOSIS — R296 Repeated falls: Secondary | ICD-10-CM | POA: Diagnosis not present

## 2019-03-02 DIAGNOSIS — M6281 Muscle weakness (generalized): Secondary | ICD-10-CM | POA: Diagnosis not present

## 2019-03-02 DIAGNOSIS — Z7984 Long term (current) use of oral hypoglycemic drugs: Secondary | ICD-10-CM | POA: Diagnosis not present

## 2019-03-02 DIAGNOSIS — E1151 Type 2 diabetes mellitus with diabetic peripheral angiopathy without gangrene: Secondary | ICD-10-CM | POA: Diagnosis not present

## 2019-03-02 DIAGNOSIS — Z7902 Long term (current) use of antithrombotics/antiplatelets: Secondary | ICD-10-CM | POA: Diagnosis not present

## 2019-03-02 DIAGNOSIS — I10 Essential (primary) hypertension: Secondary | ICD-10-CM | POA: Diagnosis not present

## 2019-03-02 DIAGNOSIS — I872 Venous insufficiency (chronic) (peripheral): Secondary | ICD-10-CM | POA: Diagnosis not present

## 2019-03-02 DIAGNOSIS — Z9181 History of falling: Secondary | ICD-10-CM | POA: Diagnosis not present

## 2019-03-03 DIAGNOSIS — I10 Essential (primary) hypertension: Secondary | ICD-10-CM | POA: Diagnosis not present

## 2019-03-03 DIAGNOSIS — Z7984 Long term (current) use of oral hypoglycemic drugs: Secondary | ICD-10-CM | POA: Diagnosis not present

## 2019-03-03 DIAGNOSIS — M6281 Muscle weakness (generalized): Secondary | ICD-10-CM | POA: Diagnosis not present

## 2019-03-03 DIAGNOSIS — Z9181 History of falling: Secondary | ICD-10-CM | POA: Diagnosis not present

## 2019-03-03 DIAGNOSIS — I872 Venous insufficiency (chronic) (peripheral): Secondary | ICD-10-CM | POA: Diagnosis not present

## 2019-03-03 DIAGNOSIS — Z7902 Long term (current) use of antithrombotics/antiplatelets: Secondary | ICD-10-CM | POA: Diagnosis not present

## 2019-03-03 DIAGNOSIS — R296 Repeated falls: Secondary | ICD-10-CM | POA: Diagnosis not present

## 2019-03-03 DIAGNOSIS — E1151 Type 2 diabetes mellitus with diabetic peripheral angiopathy without gangrene: Secondary | ICD-10-CM | POA: Diagnosis not present

## 2019-03-06 ENCOUNTER — Other Ambulatory Visit: Payer: Self-pay | Admitting: Family Medicine

## 2019-03-06 DIAGNOSIS — Z7902 Long term (current) use of antithrombotics/antiplatelets: Secondary | ICD-10-CM | POA: Diagnosis not present

## 2019-03-06 DIAGNOSIS — M6281 Muscle weakness (generalized): Secondary | ICD-10-CM | POA: Diagnosis not present

## 2019-03-06 DIAGNOSIS — I10 Essential (primary) hypertension: Secondary | ICD-10-CM | POA: Diagnosis not present

## 2019-03-06 DIAGNOSIS — I872 Venous insufficiency (chronic) (peripheral): Secondary | ICD-10-CM | POA: Diagnosis not present

## 2019-03-06 DIAGNOSIS — Z7984 Long term (current) use of oral hypoglycemic drugs: Secondary | ICD-10-CM | POA: Diagnosis not present

## 2019-03-06 DIAGNOSIS — Z9181 History of falling: Secondary | ICD-10-CM | POA: Diagnosis not present

## 2019-03-06 DIAGNOSIS — E1151 Type 2 diabetes mellitus with diabetic peripheral angiopathy without gangrene: Secondary | ICD-10-CM | POA: Diagnosis not present

## 2019-03-06 DIAGNOSIS — R296 Repeated falls: Secondary | ICD-10-CM | POA: Diagnosis not present

## 2019-03-07 DIAGNOSIS — M6281 Muscle weakness (generalized): Secondary | ICD-10-CM | POA: Diagnosis not present

## 2019-03-07 DIAGNOSIS — I872 Venous insufficiency (chronic) (peripheral): Secondary | ICD-10-CM | POA: Diagnosis not present

## 2019-03-07 DIAGNOSIS — Z7984 Long term (current) use of oral hypoglycemic drugs: Secondary | ICD-10-CM | POA: Diagnosis not present

## 2019-03-07 DIAGNOSIS — R296 Repeated falls: Secondary | ICD-10-CM | POA: Diagnosis not present

## 2019-03-07 DIAGNOSIS — Z9181 History of falling: Secondary | ICD-10-CM | POA: Diagnosis not present

## 2019-03-07 DIAGNOSIS — I10 Essential (primary) hypertension: Secondary | ICD-10-CM | POA: Diagnosis not present

## 2019-03-07 DIAGNOSIS — Z7902 Long term (current) use of antithrombotics/antiplatelets: Secondary | ICD-10-CM | POA: Diagnosis not present

## 2019-03-07 DIAGNOSIS — E1151 Type 2 diabetes mellitus with diabetic peripheral angiopathy without gangrene: Secondary | ICD-10-CM | POA: Diagnosis not present

## 2019-03-09 DIAGNOSIS — Z7984 Long term (current) use of oral hypoglycemic drugs: Secondary | ICD-10-CM | POA: Diagnosis not present

## 2019-03-09 DIAGNOSIS — Z9181 History of falling: Secondary | ICD-10-CM | POA: Diagnosis not present

## 2019-03-09 DIAGNOSIS — I872 Venous insufficiency (chronic) (peripheral): Secondary | ICD-10-CM | POA: Diagnosis not present

## 2019-03-09 DIAGNOSIS — M6281 Muscle weakness (generalized): Secondary | ICD-10-CM | POA: Diagnosis not present

## 2019-03-09 DIAGNOSIS — I10 Essential (primary) hypertension: Secondary | ICD-10-CM | POA: Diagnosis not present

## 2019-03-09 DIAGNOSIS — Z7902 Long term (current) use of antithrombotics/antiplatelets: Secondary | ICD-10-CM | POA: Diagnosis not present

## 2019-03-09 DIAGNOSIS — R296 Repeated falls: Secondary | ICD-10-CM | POA: Diagnosis not present

## 2019-03-09 DIAGNOSIS — E1151 Type 2 diabetes mellitus with diabetic peripheral angiopathy without gangrene: Secondary | ICD-10-CM | POA: Diagnosis not present

## 2019-03-13 DIAGNOSIS — M6281 Muscle weakness (generalized): Secondary | ICD-10-CM | POA: Diagnosis not present

## 2019-03-13 DIAGNOSIS — Z7902 Long term (current) use of antithrombotics/antiplatelets: Secondary | ICD-10-CM | POA: Diagnosis not present

## 2019-03-13 DIAGNOSIS — R296 Repeated falls: Secondary | ICD-10-CM | POA: Diagnosis not present

## 2019-03-13 DIAGNOSIS — I509 Heart failure, unspecified: Secondary | ICD-10-CM | POA: Diagnosis not present

## 2019-03-13 DIAGNOSIS — I10 Essential (primary) hypertension: Secondary | ICD-10-CM | POA: Diagnosis not present

## 2019-03-13 DIAGNOSIS — Z9181 History of falling: Secondary | ICD-10-CM | POA: Diagnosis not present

## 2019-03-13 DIAGNOSIS — Z7984 Long term (current) use of oral hypoglycemic drugs: Secondary | ICD-10-CM | POA: Diagnosis not present

## 2019-03-13 DIAGNOSIS — E1151 Type 2 diabetes mellitus with diabetic peripheral angiopathy without gangrene: Secondary | ICD-10-CM | POA: Diagnosis not present

## 2019-03-13 DIAGNOSIS — I872 Venous insufficiency (chronic) (peripheral): Secondary | ICD-10-CM | POA: Diagnosis not present

## 2019-03-13 DIAGNOSIS — R062 Wheezing: Secondary | ICD-10-CM | POA: Diagnosis not present

## 2019-03-14 ENCOUNTER — Other Ambulatory Visit: Payer: Self-pay | Admitting: Family Medicine

## 2019-03-14 ENCOUNTER — Ambulatory Visit: Payer: Medicare Other | Admitting: Family Medicine

## 2019-03-14 ENCOUNTER — Telehealth: Payer: Self-pay | Admitting: Family Medicine

## 2019-03-14 NOTE — Telephone Encounter (Signed)
I spoke with her and she is aware of the need to bring in all her meds

## 2019-03-14 NOTE — Telephone Encounter (Signed)
Cazt with [facility] that pt BP was high 170/120. (Monday 5-18 afternoon)  I called spoke with pt on Tuesday 5-19 , she advised that the BP came down she was stressed and running around the house.  Made pt appointment to see Dr Moshe Cipro on 5-20 in the office

## 2019-03-15 ENCOUNTER — Ambulatory Visit (HOSPITAL_COMMUNITY): Payer: Medicare Other

## 2019-03-15 ENCOUNTER — Ambulatory Visit (INDEPENDENT_AMBULATORY_CARE_PROVIDER_SITE_OTHER): Payer: Medicare Other | Admitting: Family Medicine

## 2019-03-15 ENCOUNTER — Encounter: Payer: Self-pay | Admitting: Family Medicine

## 2019-03-15 ENCOUNTER — Other Ambulatory Visit: Payer: Self-pay

## 2019-03-15 VITALS — BP 160/112 | HR 84 | Temp 98.3°F | Resp 15 | Ht 61.0 in | Wt 183.0 lb

## 2019-03-15 DIAGNOSIS — Z7984 Long term (current) use of oral hypoglycemic drugs: Secondary | ICD-10-CM | POA: Diagnosis not present

## 2019-03-15 DIAGNOSIS — E1151 Type 2 diabetes mellitus with diabetic peripheral angiopathy without gangrene: Secondary | ICD-10-CM | POA: Diagnosis not present

## 2019-03-15 DIAGNOSIS — L97509 Non-pressure chronic ulcer of other part of unspecified foot with unspecified severity: Secondary | ICD-10-CM

## 2019-03-15 DIAGNOSIS — E11621 Type 2 diabetes mellitus with foot ulcer: Secondary | ICD-10-CM | POA: Diagnosis not present

## 2019-03-15 DIAGNOSIS — I5042 Chronic combined systolic (congestive) and diastolic (congestive) heart failure: Secondary | ICD-10-CM

## 2019-03-15 DIAGNOSIS — R296 Repeated falls: Secondary | ICD-10-CM | POA: Diagnosis not present

## 2019-03-15 DIAGNOSIS — I872 Venous insufficiency (chronic) (peripheral): Secondary | ICD-10-CM | POA: Diagnosis not present

## 2019-03-15 DIAGNOSIS — M6281 Muscle weakness (generalized): Secondary | ICD-10-CM | POA: Diagnosis not present

## 2019-03-15 DIAGNOSIS — E1152 Type 2 diabetes mellitus with diabetic peripheral angiopathy with gangrene: Secondary | ICD-10-CM

## 2019-03-15 DIAGNOSIS — I10 Essential (primary) hypertension: Secondary | ICD-10-CM | POA: Diagnosis not present

## 2019-03-15 DIAGNOSIS — R29898 Other symptoms and signs involving the musculoskeletal system: Secondary | ICD-10-CM

## 2019-03-15 DIAGNOSIS — Z9181 History of falling: Secondary | ICD-10-CM | POA: Diagnosis not present

## 2019-03-15 DIAGNOSIS — Z7902 Long term (current) use of antithrombotics/antiplatelets: Secondary | ICD-10-CM | POA: Diagnosis not present

## 2019-03-15 DIAGNOSIS — F322 Major depressive disorder, single episode, severe without psychotic features: Secondary | ICD-10-CM

## 2019-03-15 DIAGNOSIS — Z7189 Other specified counseling: Secondary | ICD-10-CM

## 2019-03-15 MED ORDER — METOPROLOL SUCCINATE ER 50 MG PO TB24: ORAL_TABLET | ORAL | 3 refills | Status: DC

## 2019-03-15 MED ORDER — LINAGLIPTIN 5 MG PO TABS: 5.0000 mg | ORAL_TABLET | Freq: Every day | ORAL | 3 refills | Status: DC

## 2019-03-15 NOTE — Patient Instructions (Addendum)
F/U in June as before  New additional med for blood pressure os metoprolol 50 mg one twice daily, continue same dose of amlodipine, 10 mg once daily  You are referred to cardiology  Labs needed 5 days before f/u , non fasting cmp and eGFR, hBA1C and tSH  You are referred to Cardiology  You are referred to Neurology as legs give pout  Application for CAP will be completed  Social distancing. Frequent hand washing with soap and water Keeping your hands off of your face. These 3 practices will help to keep both you and your community healthy during this time. Please practice them faithfully!

## 2019-03-15 NOTE — Progress Notes (Signed)
[name]     MRN: [id number]      DOB: 06/11/1966   HPI [name] is here for follow up and re-evaluation of chronic medical conditions, medication management and review of any available recent lab and radiology data.  Preventive health is updated, specifically  Cancer screening and Immunization.   Questions or concerns regarding consultations or procedures which the PT has had in the interim are  addressed. The PT denies any adverse reactions to current medications since the last visit.  In for evaluation of uncontrolled hypertension Requests assistance with getting helpin her home as she is at high fall risk due to lower extremity weakness   ROS Denies recent fever or chills. Denies sinus pressure, nasal congestion, ear pain or sore throat. Denies chest congestion, productive cough or wheezing. Denies chest pains, palpitations and leg swelling Denies abdominal pain, nausea, vomiting,diarrhea or constipation.   Denies dysuria, frequency, hesitancy or incontinence. States that she has significant weakness in her legs which cause her to fall. Requesting assistance in her home with ADL's , and also needs Neurology o eval muscle weakness . Denies headaches, seizures, Denies depression, anxiety or insomnia. Denies skin break down or rash.   PE  BP (!) 168/112   Pulse 84   Temp 98.3 F (36.8 C) (Temporal)   Resp 15   Ht 5\' 1"  (1.549 m)   Wt 183 lb (83 kg)   LMP 05/19/2016   SpO2 100%   BMI 34.58 kg/m   Patient alert and oriented and in no cardiopulmonary distress.  HEENT: No facial asymmetry, EOMI,   oropharynx pink and moist.  Neck supple no JVD, no mass.  Chest: Clear to auscultation bilaterally.  CVS: S1, S2 no murmurs, no S3.Regular rate.  ABD: Soft non tender.   Ext: No edema  MS: decreased  ROM spine, shoulders, hips and knees.  Skin: Intact, no ulcerations or rash noted.  Psych: Good eye contact, normal affect. Memory intact not anxious or depressed  appearing.  CNS: CN 2-12 intact, power,  normal throughout.no focal deficits noted.   Assessment & Plan  HTN (hypertension), malignant Uncontrolled additional medication prescribed wih close follow up DASH diet and commitment to daily physical activity for a minimum of 30 minutes discussed and encouraged, as a part of hypertension management. The importance of attaining a healthy weight is also discussed.  BP/Weight 03/15/2019 01/26/2019 12/19/2018 12/07/2018 11/26/2018 11/22/2018 14/97/0263  Systolic BP 785 885 - 027 741 - 287  Diastolic BP 867 672 - 094 117 - 101  Wt. (Lbs) 183 166 166 166.04 - 172 -  BMI 34.58 31.37 31.37 31.37 - 32.5 -  Some encounter information is confidential and restricted. Go to Review Flowsheets activity to see all data.       Lower extremity weakness Bilatera llower extremity weakness with instability and high fall risk, appears to have muscle wasting also Refer to Neurology for evaluation , complete form for CAP assistance with ADL's  Chronic combined systolic and diastolic CHF (congestive heart failure) ([place]) Uncontrolled malignant hypertension and combined heart failure, needs cardiology follow up, will refer  Depression, major, single episode, severe ([place]) Aneeds treatment through Psychiatry will refer if she agrees  Educated About Covid-19 Virus Infection Covid-19 Education  The signs and symptoms of of COVID -19 were discussed with the patient and how to seek care for testing. ( follow up with PCP or arrange  E-visit) The importance of social  distancing is discussed today.

## 2019-03-16 DIAGNOSIS — E1151 Type 2 diabetes mellitus with diabetic peripheral angiopathy without gangrene: Secondary | ICD-10-CM | POA: Diagnosis not present

## 2019-03-16 DIAGNOSIS — R296 Repeated falls: Secondary | ICD-10-CM | POA: Diagnosis not present

## 2019-03-16 DIAGNOSIS — M6281 Muscle weakness (generalized): Secondary | ICD-10-CM | POA: Diagnosis not present

## 2019-03-16 DIAGNOSIS — I872 Venous insufficiency (chronic) (peripheral): Secondary | ICD-10-CM | POA: Diagnosis not present

## 2019-03-16 DIAGNOSIS — I10 Essential (primary) hypertension: Secondary | ICD-10-CM | POA: Diagnosis not present

## 2019-03-16 DIAGNOSIS — Z7984 Long term (current) use of oral hypoglycemic drugs: Secondary | ICD-10-CM | POA: Diagnosis not present

## 2019-03-16 DIAGNOSIS — Z9181 History of falling: Secondary | ICD-10-CM | POA: Diagnosis not present

## 2019-03-16 DIAGNOSIS — Z7902 Long term (current) use of antithrombotics/antiplatelets: Secondary | ICD-10-CM | POA: Diagnosis not present

## 2019-03-17 ENCOUNTER — Telehealth: Payer: Self-pay | Admitting: Family Medicine

## 2019-03-17 NOTE — Telephone Encounter (Signed)
Orders given.  

## 2019-03-17 NOTE — Telephone Encounter (Signed)
[name] is calling wanting Physical Therapy extended for PT  You can leave the ok on her answering machine

## 2019-03-20 DIAGNOSIS — Z7189 Other specified counseling: Secondary | ICD-10-CM | POA: Insufficient documentation

## 2019-03-20 NOTE — Assessment & Plan Note (Signed)
Uncontrolled malignant hypertension and combined heart failure, needs cardiology follow up, will refer

## 2019-03-20 NOTE — Assessment & Plan Note (Signed)
Covid-19 Education  The signs and symptoms of of COVID -19 were discussed with the patient and how to seek care for testing. ( follow up with PCP or arrange  E-visit) The importance of social  distancing is discussed today.  

## 2019-03-20 NOTE — Assessment & Plan Note (Signed)
Uncontrolled additional medication prescribed wih close follow up DASH diet and commitment to daily physical activity for a minimum of 30 minutes discussed and encouraged, as a part of hypertension management. The importance of attaining a healthy weight is also discussed.  BP/Weight 03/15/2019 01/26/2019 12/19/2018 12/07/2018 11/26/2018 11/22/2018 14/43/6016  Systolic BP 580 063 - 494 944 - 739  Diastolic BP 584 417 - 127 117 - 101  Wt. (Lbs) 183 166 166 166.04 - 172 -  BMI 34.58 31.37 31.37 31.37 - 32.5 -  Some encounter information is confidential and restricted. Go to Review Flowsheets activity to see all data.

## 2019-03-20 NOTE — Assessment & Plan Note (Addendum)
Aneeds treatment through Psychiatry will refer if she agrees

## 2019-03-20 NOTE — Assessment & Plan Note (Signed)
Bilatera llower extremity weakness with instability and high fall risk, appears to have muscle wasting also Refer to Neurology for evaluation , complete form for CAP assistance with ADL's

## 2019-03-22 ENCOUNTER — Ambulatory Visit: Payer: Self-pay | Admitting: Family Medicine

## 2019-03-22 DIAGNOSIS — E1151 Type 2 diabetes mellitus with diabetic peripheral angiopathy without gangrene: Secondary | ICD-10-CM | POA: Diagnosis not present

## 2019-03-22 DIAGNOSIS — I10 Essential (primary) hypertension: Secondary | ICD-10-CM | POA: Diagnosis not present

## 2019-03-22 DIAGNOSIS — Z9181 History of falling: Secondary | ICD-10-CM | POA: Diagnosis not present

## 2019-03-22 DIAGNOSIS — I872 Venous insufficiency (chronic) (peripheral): Secondary | ICD-10-CM | POA: Diagnosis not present

## 2019-03-22 DIAGNOSIS — M6281 Muscle weakness (generalized): Secondary | ICD-10-CM | POA: Diagnosis not present

## 2019-03-22 DIAGNOSIS — R296 Repeated falls: Secondary | ICD-10-CM | POA: Diagnosis not present

## 2019-03-22 DIAGNOSIS — Z7984 Long term (current) use of oral hypoglycemic drugs: Secondary | ICD-10-CM | POA: Diagnosis not present

## 2019-03-22 DIAGNOSIS — Z7902 Long term (current) use of antithrombotics/antiplatelets: Secondary | ICD-10-CM | POA: Diagnosis not present

## 2019-03-23 DIAGNOSIS — Z7984 Long term (current) use of oral hypoglycemic drugs: Secondary | ICD-10-CM | POA: Diagnosis not present

## 2019-03-23 DIAGNOSIS — I872 Venous insufficiency (chronic) (peripheral): Secondary | ICD-10-CM | POA: Diagnosis not present

## 2019-03-23 DIAGNOSIS — E1151 Type 2 diabetes mellitus with diabetic peripheral angiopathy without gangrene: Secondary | ICD-10-CM | POA: Diagnosis not present

## 2019-03-23 DIAGNOSIS — I10 Essential (primary) hypertension: Secondary | ICD-10-CM | POA: Diagnosis not present

## 2019-03-23 DIAGNOSIS — M6281 Muscle weakness (generalized): Secondary | ICD-10-CM | POA: Diagnosis not present

## 2019-03-23 DIAGNOSIS — Z7902 Long term (current) use of antithrombotics/antiplatelets: Secondary | ICD-10-CM | POA: Diagnosis not present

## 2019-03-23 DIAGNOSIS — R296 Repeated falls: Secondary | ICD-10-CM | POA: Diagnosis not present

## 2019-03-23 DIAGNOSIS — Z9181 History of falling: Secondary | ICD-10-CM | POA: Diagnosis not present

## 2019-03-24 DIAGNOSIS — M6281 Muscle weakness (generalized): Secondary | ICD-10-CM | POA: Diagnosis not present

## 2019-03-24 DIAGNOSIS — Z7984 Long term (current) use of oral hypoglycemic drugs: Secondary | ICD-10-CM

## 2019-03-24 DIAGNOSIS — R296 Repeated falls: Secondary | ICD-10-CM | POA: Diagnosis not present

## 2019-03-24 DIAGNOSIS — Z7902 Long term (current) use of antithrombotics/antiplatelets: Secondary | ICD-10-CM

## 2019-03-24 DIAGNOSIS — Z9181 History of falling: Secondary | ICD-10-CM

## 2019-03-24 DIAGNOSIS — I10 Essential (primary) hypertension: Secondary | ICD-10-CM

## 2019-03-24 DIAGNOSIS — I872 Venous insufficiency (chronic) (peripheral): Secondary | ICD-10-CM | POA: Diagnosis not present

## 2019-03-24 DIAGNOSIS — E1151 Type 2 diabetes mellitus with diabetic peripheral angiopathy without gangrene: Secondary | ICD-10-CM | POA: Diagnosis not present

## 2019-03-27 ENCOUNTER — Encounter: Payer: Self-pay | Admitting: Family Medicine

## 2019-03-27 ENCOUNTER — Other Ambulatory Visit: Payer: Self-pay

## 2019-03-27 ENCOUNTER — Ambulatory Visit (INDEPENDENT_AMBULATORY_CARE_PROVIDER_SITE_OTHER): Payer: Medicare Other | Admitting: Family Medicine

## 2019-03-27 VITALS — BP 180/118 | HR 69 | Resp 12 | Ht 61.0 in | Wt 179.0 lb

## 2019-03-27 DIAGNOSIS — Z1231 Encounter for screening mammogram for malignant neoplasm of breast: Secondary | ICD-10-CM | POA: Diagnosis not present

## 2019-03-27 DIAGNOSIS — F5105 Insomnia due to other mental disorder: Secondary | ICD-10-CM

## 2019-03-27 DIAGNOSIS — E1159 Type 2 diabetes mellitus with other circulatory complications: Secondary | ICD-10-CM | POA: Diagnosis not present

## 2019-03-27 DIAGNOSIS — F99 Mental disorder, not otherwise specified: Secondary | ICD-10-CM

## 2019-03-27 DIAGNOSIS — R296 Repeated falls: Secondary | ICD-10-CM

## 2019-03-27 DIAGNOSIS — E785 Hyperlipidemia, unspecified: Secondary | ICD-10-CM | POA: Diagnosis not present

## 2019-03-27 DIAGNOSIS — I10 Essential (primary) hypertension: Secondary | ICD-10-CM | POA: Diagnosis not present

## 2019-03-27 DIAGNOSIS — J449 Chronic obstructive pulmonary disease, unspecified: Secondary | ICD-10-CM | POA: Diagnosis not present

## 2019-03-27 DIAGNOSIS — I5042 Chronic combined systolic (congestive) and diastolic (congestive) heart failure: Secondary | ICD-10-CM

## 2019-03-27 NOTE — Patient Instructions (Addendum)
Annual physical exam July 20 or after, call if you need me before  You NEED to take your BP meds so that this is controlled , on SCHEDULE, today blood pressure is very high, which is not good for your health  labs tomorrow/ today, lipid, hBA1C, cmp and EGFR   You are referred for a mammogram  Thanks for choosing [facility], we consider it a privelige to serve you.   Social distancing. Frequent hand washing with soap and water Keeping your hands off of your face. These 3 practices will help to keep both you and your community healthy during this time. Please practice them faithfully!

## 2019-03-29 DIAGNOSIS — Z9181 History of falling: Secondary | ICD-10-CM | POA: Diagnosis not present

## 2019-03-29 DIAGNOSIS — Z7984 Long term (current) use of oral hypoglycemic drugs: Secondary | ICD-10-CM | POA: Diagnosis not present

## 2019-03-29 DIAGNOSIS — Z7902 Long term (current) use of antithrombotics/antiplatelets: Secondary | ICD-10-CM | POA: Diagnosis not present

## 2019-03-29 DIAGNOSIS — R296 Repeated falls: Secondary | ICD-10-CM | POA: Diagnosis not present

## 2019-03-29 DIAGNOSIS — M6281 Muscle weakness (generalized): Secondary | ICD-10-CM | POA: Diagnosis not present

## 2019-03-29 DIAGNOSIS — I10 Essential (primary) hypertension: Secondary | ICD-10-CM | POA: Diagnosis not present

## 2019-03-29 DIAGNOSIS — I872 Venous insufficiency (chronic) (peripheral): Secondary | ICD-10-CM | POA: Diagnosis not present

## 2019-03-29 DIAGNOSIS — E1151 Type 2 diabetes mellitus with diabetic peripheral angiopathy without gangrene: Secondary | ICD-10-CM | POA: Diagnosis not present

## 2019-03-30 DIAGNOSIS — I872 Venous insufficiency (chronic) (peripheral): Secondary | ICD-10-CM | POA: Diagnosis not present

## 2019-03-30 DIAGNOSIS — I10 Essential (primary) hypertension: Secondary | ICD-10-CM | POA: Diagnosis not present

## 2019-03-30 DIAGNOSIS — E1151 Type 2 diabetes mellitus with diabetic peripheral angiopathy without gangrene: Secondary | ICD-10-CM | POA: Diagnosis not present

## 2019-03-30 DIAGNOSIS — Z9181 History of falling: Secondary | ICD-10-CM | POA: Diagnosis not present

## 2019-03-30 DIAGNOSIS — R296 Repeated falls: Secondary | ICD-10-CM | POA: Diagnosis not present

## 2019-03-30 DIAGNOSIS — Z7984 Long term (current) use of oral hypoglycemic drugs: Secondary | ICD-10-CM | POA: Diagnosis not present

## 2019-03-30 DIAGNOSIS — M6281 Muscle weakness (generalized): Secondary | ICD-10-CM | POA: Diagnosis not present

## 2019-03-30 DIAGNOSIS — Z7902 Long term (current) use of antithrombotics/antiplatelets: Secondary | ICD-10-CM | POA: Diagnosis not present

## 2019-03-31 DIAGNOSIS — E1151 Type 2 diabetes mellitus with diabetic peripheral angiopathy without gangrene: Secondary | ICD-10-CM | POA: Diagnosis not present

## 2019-03-31 DIAGNOSIS — M6281 Muscle weakness (generalized): Secondary | ICD-10-CM | POA: Diagnosis not present

## 2019-03-31 DIAGNOSIS — I872 Venous insufficiency (chronic) (peripheral): Secondary | ICD-10-CM | POA: Diagnosis not present

## 2019-03-31 DIAGNOSIS — I10 Essential (primary) hypertension: Secondary | ICD-10-CM | POA: Diagnosis not present

## 2019-03-31 DIAGNOSIS — Z9181 History of falling: Secondary | ICD-10-CM | POA: Diagnosis not present

## 2019-03-31 DIAGNOSIS — R296 Repeated falls: Secondary | ICD-10-CM | POA: Diagnosis not present

## 2019-03-31 DIAGNOSIS — Z7902 Long term (current) use of antithrombotics/antiplatelets: Secondary | ICD-10-CM | POA: Diagnosis not present

## 2019-03-31 DIAGNOSIS — Z7984 Long term (current) use of oral hypoglycemic drugs: Secondary | ICD-10-CM | POA: Diagnosis not present

## 2019-04-04 ENCOUNTER — Other Ambulatory Visit (INDEPENDENT_AMBULATORY_CARE_PROVIDER_SITE_OTHER): Payer: Self-pay | Admitting: Orthopedic Surgery

## 2019-04-04 DIAGNOSIS — I10 Essential (primary) hypertension: Secondary | ICD-10-CM | POA: Diagnosis not present

## 2019-04-04 DIAGNOSIS — Z7984 Long term (current) use of oral hypoglycemic drugs: Secondary | ICD-10-CM | POA: Diagnosis not present

## 2019-04-04 DIAGNOSIS — M6281 Muscle weakness (generalized): Secondary | ICD-10-CM | POA: Diagnosis not present

## 2019-04-04 DIAGNOSIS — R296 Repeated falls: Secondary | ICD-10-CM | POA: Diagnosis not present

## 2019-04-04 DIAGNOSIS — I872 Venous insufficiency (chronic) (peripheral): Secondary | ICD-10-CM | POA: Diagnosis not present

## 2019-04-04 DIAGNOSIS — Z7902 Long term (current) use of antithrombotics/antiplatelets: Secondary | ICD-10-CM | POA: Diagnosis not present

## 2019-04-04 DIAGNOSIS — E1151 Type 2 diabetes mellitus with diabetic peripheral angiopathy without gangrene: Secondary | ICD-10-CM | POA: Diagnosis not present

## 2019-04-04 DIAGNOSIS — Z9181 History of falling: Secondary | ICD-10-CM | POA: Diagnosis not present

## 2019-04-06 DIAGNOSIS — I872 Venous insufficiency (chronic) (peripheral): Secondary | ICD-10-CM | POA: Diagnosis not present

## 2019-04-06 DIAGNOSIS — Z7984 Long term (current) use of oral hypoglycemic drugs: Secondary | ICD-10-CM | POA: Diagnosis not present

## 2019-04-06 DIAGNOSIS — M6281 Muscle weakness (generalized): Secondary | ICD-10-CM | POA: Diagnosis not present

## 2019-04-06 DIAGNOSIS — Z9181 History of falling: Secondary | ICD-10-CM | POA: Diagnosis not present

## 2019-04-06 DIAGNOSIS — I10 Essential (primary) hypertension: Secondary | ICD-10-CM | POA: Diagnosis not present

## 2019-04-06 DIAGNOSIS — E1151 Type 2 diabetes mellitus with diabetic peripheral angiopathy without gangrene: Secondary | ICD-10-CM | POA: Diagnosis not present

## 2019-04-06 DIAGNOSIS — R296 Repeated falls: Secondary | ICD-10-CM | POA: Diagnosis not present

## 2019-04-06 DIAGNOSIS — Z7902 Long term (current) use of antithrombotics/antiplatelets: Secondary | ICD-10-CM | POA: Diagnosis not present

## 2019-04-07 DIAGNOSIS — R296 Repeated falls: Secondary | ICD-10-CM | POA: Diagnosis not present

## 2019-04-07 DIAGNOSIS — Z7902 Long term (current) use of antithrombotics/antiplatelets: Secondary | ICD-10-CM | POA: Diagnosis not present

## 2019-04-07 DIAGNOSIS — M6281 Muscle weakness (generalized): Secondary | ICD-10-CM | POA: Diagnosis not present

## 2019-04-07 DIAGNOSIS — I10 Essential (primary) hypertension: Secondary | ICD-10-CM | POA: Diagnosis not present

## 2019-04-07 DIAGNOSIS — I872 Venous insufficiency (chronic) (peripheral): Secondary | ICD-10-CM | POA: Diagnosis not present

## 2019-04-07 DIAGNOSIS — E1151 Type 2 diabetes mellitus with diabetic peripheral angiopathy without gangrene: Secondary | ICD-10-CM | POA: Diagnosis not present

## 2019-04-07 DIAGNOSIS — Z9181 History of falling: Secondary | ICD-10-CM | POA: Diagnosis not present

## 2019-04-07 DIAGNOSIS — Z7984 Long term (current) use of oral hypoglycemic drugs: Secondary | ICD-10-CM | POA: Diagnosis not present

## 2019-04-13 ENCOUNTER — Encounter: Payer: Self-pay | Admitting: Family Medicine

## 2019-04-13 DIAGNOSIS — R062 Wheezing: Secondary | ICD-10-CM | POA: Diagnosis not present

## 2019-04-13 DIAGNOSIS — I509 Heart failure, unspecified: Secondary | ICD-10-CM | POA: Diagnosis not present

## 2019-04-13 NOTE — Assessment & Plan Note (Signed)
Clinically stable with no current decompensation despite markedly elevated SBP

## 2019-04-13 NOTE — Assessment & Plan Note (Signed)
[name] is reminded of the importance of commitment to daily physical activity for 30 minutes or more, as able and the need to limit carbohydrate intake to 30 to 60 grams per meal to help with blood sugar control.   The need to take medication as prescribed, test blood sugar as directed, and to call between visits if there is a concern that blood sugar is uncontrolled is also discussed.   [name] is reminded of the importance of daily foot exam, annual eye examination, and good blood sugar, blood pressure and cholesterol control.  Diabetic Labs Latest Ref Rng & Units 12/07/2018 11/25/2018 11/24/2018 11/23/2018 10/13/2018  HbA1c 4.8 - 5.6 % - - - 7.0(H) -  Microalbumin Not estab mg/dL - - - - -  Micro/Creat Ratio <30 mcg/mg creat - - - - -  Chol <200 mg/dL 144 - - - -  HDL > OR = 50 mg/dL 55 - - - -  Calc LDL mg/dL (calc) 73 - - - -  Triglycerides <150 mg/dL 79 - - - -  Creatinine 0.44 - 1.00 mg/dL - 1.41(H) 1.48(H) 1.34(H) 1.44(H)  GFR >60.00 mL/min - - - - -   BP/Weight 03/27/2019 03/15/2019 01/26/2019 12/19/2018 12/07/2018 11/26/2018 05/01/6150  Systolic BP 834 373 578 - 978 478 -  Diastolic BP 412 820 813 - 108 117 -  Wt. (Lbs) 179 183 166 166 166.04 - 172  BMI 33.82 34.58 31.37 31.37 31.37 - 32.5  Some encounter information is confidential and restricted. Go to Review Flowsheets activity to see all data.   Foot/eye exam completion dates Latest Ref Rng & Units 07/26/2018 03/16/2017  Eye Exam No Retinopathy - Retinopathy(A)  Foot exam Order - - -  Foot Form Completion - Done -      Updated lab needed

## 2019-04-13 NOTE — Progress Notes (Signed)
VERGIA CHEA     MRN: [id number]      DOB: 04-11-1966   HPI Ms. Moncrief is here for follow up and re-evaluation of chronic medical conditions,in particular uncontrolled malignant hypetension medication management and review of any available recent lab and radiology data. Labs are overdue and are needed Preventive health is updated, specifically  Cancer screening and Immunization.    The PT denies any adverse reactions to current medications since the last visit.  There are no new concerns.  There are no specific complaints  Did not take medication for her blood pressure on the day of the visit  ROS Denies recent fever or chills. Denies sinus pressure, nasal congestion, ear pain or sore throat. Denies chest congestion, productive cough or wheezing. Denies chest pains, palpitations and leg swelling Denies abdominal pain, nausea, vomiting,diarrhea or constipation.   Denies dysuria, frequency, c/o  incontinence. C/o chronic c/o unsteady gait and recurrent falls/ near falls  C/o joint pain, swelling and limitation in mobility. Denies headaches, seizures, numbness, or tingling. c/o   depression, denies anxiety or insomnia.Not suicidal or homicidal Denies skin break down or rash. Denies polyuria, polydipsia, blurred vision , or hypoglycemic episodes.    PE  BP (!) 180/118   Pulse 69   Resp 12   Ht 5\' 1"  (1.549 m)   Wt 179 lb (81.2 kg)   LMP 05/19/2016   SpO2 97%   BMI 33.82 kg/m   Patient alert and oriented  HEENT: No facial asymmetry, EOMI,   oropharynx pink and moist.  Neck supple no JVD, no mass.  Chest: Clear to auscultation bilaterally.  CVS: S1, S2 no murmurs, no S3.Regular rate.  ABD: Soft non tender.   Ext: No edema  MS: Decreased  ROM spine, shoulders, hips and knees.  Skin: Intact, no ulcerations or rash noted.  Psych: Good eye contact, normal affect. Memory intact not anxious or depressed appearing.  CNS: CN 2-12 intact,.no focal deficits noted.    Assessment & Plan  HTN (hypertension), malignant Uncontrolled due to non compliance , importance of same is again stressed DASH diet and commitment to daily physical activity for a minimum of 30 minutes discussed and encouraged, as a part of hypertension management. The importance of attaining a healthy weight is also discussed.  BP/Weight 03/27/2019 03/15/2019 01/26/2019 12/19/2018 12/07/2018 11/26/2018 05/03/6282  Systolic BP 662 947 654 - 650 354 -  Diastolic BP 656 812 751 - 108 117 -  Wt. (Lbs) 179 183 166 166 166.04 - 172  BMI 33.82 34.58 31.37 31.37 31.37 - 32.5  Some encounter information is confidential and restricted. Go to Review Flowsheets activity to see all data.       Recurrent falls while walking Application for assistance in home completed, unsteady on her feet and incapable of ADL's without assistance  Type 2 diabetes mellitus with vascular disease ([place]) Ms. Matsuo is reminded of the importance of commitment to daily physical activity for 30 minutes or more, as able and the need to limit carbohydrate intake to 30 to 60 grams per meal to help with blood sugar control.   The need to take medication as prescribed, test blood sugar as directed, and to call between visits if there is a concern that blood sugar is uncontrolled is also discussed.   Ms. Mcgirr is reminded of the importance of daily foot exam, annual eye examination, and good blood sugar, blood pressure and cholesterol control.  Diabetic Labs Latest Ref Rng & Units 12/07/2018 11/25/2018  11/24/2018 11/23/2018 10/13/2018  HbA1c 4.8 - 5.6 % - - - 7.0(H) -  Microalbumin Not estab mg/dL - - - - -  Micro/Creat Ratio <30 mcg/mg creat - - - - -  Chol <200 mg/dL 144 - - - -  HDL > OR = 50 mg/dL 55 - - - -  Calc LDL mg/dL (calc) 73 - - - -  Triglycerides <150 mg/dL 79 - - - -  Creatinine 0.44 - 1.00 mg/dL - 1.41(H) 1.48(H) 1.34(H) 1.44(H)  GFR >60.00 mL/min - - - - -   BP/Weight 03/27/2019 03/15/2019 01/26/2019 12/19/2018  12/07/2018 11/26/2018 2/48/1859  Systolic BP 093 112 162 - 446 950 -  Diastolic BP 722 575 051 - 108 117 -  Wt. (Lbs) 179 183 166 166 166.04 - 172  BMI 33.82 34.58 31.37 31.37 31.37 - 32.5  Some encounter information is confidential and restricted. Go to Review Flowsheets activity to see all data.   Foot/eye exam completion dates Latest Ref Rng & Units 07/26/2018 03/16/2017  Eye Exam No Retinopathy - Retinopathy(A)  Foot exam Order - - -  Foot Form Completion - Done -      Updated lab needed    INSOMNIA Sleep hygiene reviewed and written information offered also. Prescription sent for  medication needed.   Hyperlipidemia LDL goal <70 Hyperlipidemia:Low fat diet discussed and encouraged.   Lipid Panel  Lab Results  Component Value Date   CHOL 144 12/07/2018   HDL 55 12/07/2018   LDLCALC 73 12/07/2018   LDLDIRECT 122 (H) 01/08/2010   TRIG 79 12/07/2018   CHOLHDL 2.6 12/07/2018   Not at goal Updated lab needed at/ before next visit.     Chronic combined systolic and diastolic CHF (congestive heart failure) (HCC) Clinically stable with no current decompensation despite markedly elevated SBP  COPD (chronic obstructive pulmonary disease) (HCC) Stable and controlled on current medication, no recent flare

## 2019-04-13 NOTE — Assessment & Plan Note (Signed)
Uncontrolled due to non compliance , importance of same is again stressed DASH diet and commitment to daily physical activity for a minimum of 30 minutes discussed and encouraged, as a part of hypertension management. The importance of attaining a healthy weight is also discussed.  BP/Weight 03/27/2019 03/15/2019 01/26/2019 12/19/2018 12/07/2018 11/26/2018 8/68/2574  Systolic BP 935 521 747 - 159 539 -  Diastolic BP 672 897 915 - 108 117 -  Wt. (Lbs) 179 183 166 166 166.04 - 172  BMI 33.82 34.58 31.37 31.37 31.37 - 32.5  Some encounter information is confidential and restricted. Go to Review Flowsheets activity to see all data.

## 2019-04-13 NOTE — Assessment & Plan Note (Signed)
Stable and controlled on current medication, no recent flare

## 2019-04-13 NOTE — Assessment & Plan Note (Signed)
Application for assistance in home completed, unsteady on her feet and incapable of ADL's without assistance

## 2019-04-13 NOTE — Assessment & Plan Note (Signed)
Hyperlipidemia:Low fat diet discussed and encouraged.   Lipid Panel  Lab Results  Component Value Date   CHOL 144 12/07/2018   HDL 55 12/07/2018   LDLCALC 73 12/07/2018   LDLDIRECT 122 (H) 01/08/2010   TRIG 79 12/07/2018   CHOLHDL 2.6 12/07/2018   Not at goal Updated lab needed at/ before next visit.

## 2019-04-13 NOTE — Assessment & Plan Note (Signed)
Sleep hygiene reviewed and written information offered also. Prescription sent for  medication needed.  

## 2019-04-24 ENCOUNTER — Other Ambulatory Visit: Payer: Self-pay | Admitting: Family Medicine

## 2019-04-25 ENCOUNTER — Telehealth: Payer: Self-pay | Admitting: *Deleted

## 2019-04-25 ENCOUNTER — Other Ambulatory Visit: Payer: Self-pay | Admitting: *Deleted

## 2019-04-25 DIAGNOSIS — Z1211 Encounter for screening for malignant neoplasm of colon: Secondary | ICD-10-CM

## 2019-04-25 NOTE — Telephone Encounter (Signed)
Orders entered for covid testing and patient already scheduled to go for this lab

## 2019-04-25 NOTE — Telephone Encounter (Signed)
Called patient. She is aware she will need COVID-19 testing prior to procedure/admission. She is scheduled for 7/10 at 9:00am. Patient aware where to go for testing. Also aware she needs to come by and pick up prep sample to take to hospital for her admission. Aware needs to be quarantined after testing. Spoke with endo regarding which test order to use since patient does not have surgery orders placed yet. Per melanie in endo call next week regarding this.

## 2019-05-02 ENCOUNTER — Other Ambulatory Visit: Payer: Self-pay | Admitting: *Deleted

## 2019-05-02 NOTE — Patient Outreach (Signed)
[facility]) Care Management  05/02/2019   [name] 12/05/1965 [id number]  RN Health Coach telephone call to patient.  [employer] compliance verified. Per patient she has no one to help her. Her family and friend stops by to see her. Patient stated she did not want to go back to a skilled nursing facility. Patient BP was 180/118 last Dr visit. Per patient stated that her blood pressure is elevated due to the stress she is under needing someone to help her at home. Patient stated she is in a wheelchair. Her legs are weak. When she sits in a regular chair she can't get up. Patient stated she needs a lift chair. Patient stated her wheelchair is wobbly. Her walker is shaky. Per patient the bathroom bars she was told was put up wrong. Patient does have a wheelchair ramp at her duplex.  Per patient she has received a scale and a tablet from [facility] but is unable to put it together. RN told her to call the number that came with the equipment.Patient stated she does have a lot of swelling in her legs. Per patient she has a lot of pain in her back. She takes tramadol for the pain but it doesn't really help.  Patient is a diabetic and her A1C is 7.0. Patient does not have a meter or check her blood sugars. Patient is requesting assistance with her care, education on all her co morbidities especially blood pressure. Patient is going for COVID 19 test and then be admitted for prep for colonoscopy on Friday. .   Current Medications:  Current Outpatient Medications  Medication Sig Dispense Refill  . albuterol (PROAIR HFA) 108 (90 Base) MCG/ACT inhaler INHALE 2 PUFFS EVERY 6 HOURS AS NEEDED FOR SHORTNESS OF BREATH/WHEEZING. 8.5 g 0  . amLODipine (NORVASC) 10 MG tablet TAKE 1 TABLET BY MOUTH ONCE A DAY. 30 tablet 5  . aspirin EC 81 MG tablet Take 1 tablet (81 mg total) by mouth daily. 150 tablet 2  . cloNIDine (CATAPRES) 0.3 MG tablet TAKE 1 TABLET BY MOUTH AT BEDTIME FOR BLOOD PRESSURE  30 tablet 5  . clopidogrel (PLAVIX) 75 MG tablet TAKE 1 TABLET BY MOUTH DAILY WITH BREAKFAST. 30 tablet 5  . DULoxetine (CYMBALTA) 30 MG capsule Take 60 mg by mouth daily.     Marland Kitchen ezetimibe (ZETIA) 10 MG tablet TAKE 1 TABLET BY MOUTH ONCE A DAY. 30 tablet 5  . furosemide (LASIX) 20 MG tablet TAKE 1 TABLET BY MOUTH ONCE A DAY. 30 tablet 3  . gabapentin (NEURONTIN) 300 MG capsule TAKE 1 CAPSULE BY MOUTH THREE TIMES A DAY. 90 capsule 0  . hydrOXYzine (ATARAX/VISTARIL) 25 MG tablet TAKE 1 TABLET BY MOUTH AT BEDTIME FOR ITCHING AND SLEEP. 30 tablet 0  . ipratropium-albuterol (DUONEB) 0.5-2.5 (3) MG/3ML SOLN Take 3 mLs by nebulization every 6 (six) hours as needed (wheezes or SOB). 360 mL 1  . linagliptin (TRADJENTA) 5 MG TABS tablet Take 1 tablet (5 mg total) by mouth daily. 30 tablet 3  . metoprolol succinate (TOPROL-XL) 50 MG 24 hr tablet Take one tablet two times daily by mouth 60 tablet 3  . montelukast (SINGULAIR) 10 MG tablet TAKE (1) TABLET BY MOUTH AT BEDTIME. 30 tablet 5  . mupirocin ointment (BACTROBAN) 2 % Apply 1 application topically daily. To left knee abrasion 22 g 0  . olmesartan (BENICAR) 40 MG tablet TAKE 1 TABLET BY MOUTH ONCE A DAY. 30 tablet 0  . omeprazole (  PRILOSEC) 20 MG capsule 1 PO 30 MINS PRIOR TO BREAKFAST. 90 capsule 3  . pantoprazole (PROTONIX) 40 MG tablet TAKE 1 TABLET BY MOUTH ONCE A DAY. 30 tablet 5  . potassium chloride SA (K-DUR) 20 MEQ tablet TAKE 1&1/2 TABLET BY MOUTH DAILY. 45 tablet 5  . rosuvastatin (CRESTOR) 10 MG tablet TAKE 1 TABLET BY MOUTH ONCE A DAY. (Patient taking differently: Take 10 mg by mouth every evening. ) 30 tablet 6  . solifenacin (VESICARE) 5 MG tablet Take 1 tablet (5 mg total) by mouth daily. 30 tablet 5  . tiotropium (SPIRIVA HANDIHALER) 18 MCG inhalation capsule Place 18 mcg into inhaler and inhale daily as needed (SHORTNESS OF BREATH).     Marland Kitchen traMADol (ULTRAM) 50 MG tablet Take by mouth every 8 (eight) hours as needed.    . traZODone  (DESYREL) 50 MG tablet TAKE (1) TABLET BY MOUTH AT BEDTIME. 30 tablet 5   No current facility-administered medications for this visit.     Functional Status:  In your present state of health, do you have any difficulty performing the following activities: 02/21/2019 11/23/2018  Hearing? N N  Vision? Y N  Difficulty concentrating or making decisions? N N  Walking or climbing stairs? Y Y  Dressing or bathing? Y N  Doing errands, shopping? [name]  Preparing Food and eating ? Y -  Using the Toilet? Y -  In the past six months, have you accidently leaked urine? Y -  Do you have problems with loss of bowel control? Y -  Managing your Medications? N -  Managing your Finances? N -  Housekeeping or managing your Housekeeping? Y -  Some recent data might be hidden    Fall/Depression Screening: Fall Risk  03/27/2019 03/15/2019 02/21/2019  Falls in the past year? 1 0 0  Number falls in past yr: 0 0 -  Injury with Fall? 1 0 0  Risk for fall due to : - - -  Follow up - - -   PHQ 2/9 Scores 05/02/2019 03/27/2019 02/21/2019 09/05/2018 07/26/2018 07/26/2018 05/26/2018  PHQ - 2 Score 1 0 3 2 6  0 0  PHQ- 9 Score - 0 18 4 21  - -   Assessment Patient is in Wheelchair Hypertension uncontrolled Lower extremity edema Patient requesting care giver Patient requested medical equipment Patient is having colonoscopy Friday Patient needs health care education Patient needs care coordination   Plan: RN will refer this for Complex Care community management for coordination of care.  [facility] [phone]

## 2019-05-04 ENCOUNTER — Telehealth: Payer: Self-pay | Admitting: Family Medicine

## 2019-05-04 NOTE — Telephone Encounter (Signed)
Called pt, reminded her to come by office tomorrow when she goes for COVID test to pick up sample prep and instructions. Reminded her she will need to start holding Eliquis 05/08/19.

## 2019-05-05 ENCOUNTER — Other Ambulatory Visit (HOSPITAL_COMMUNITY)
Admission: RE | Admit: 2019-05-05 | Discharge: 2019-05-05 | Disposition: A | Discharge status: Home / Self Care | Payer: Medicare Other | Source: Ambulatory Visit | Attending: Gastroenterology | Admitting: Gastroenterology

## 2019-05-05 ENCOUNTER — Other Ambulatory Visit: Payer: Self-pay

## 2019-05-05 DIAGNOSIS — Z1159 Encounter for screening for other viral diseases: Secondary | ICD-10-CM | POA: Diagnosis not present

## 2019-05-05 DIAGNOSIS — Z01812 Encounter for preprocedural laboratory examination: Secondary | ICD-10-CM | POA: Insufficient documentation

## 2019-05-05 DIAGNOSIS — Z1211 Encounter for screening for malignant neoplasm of colon: Secondary | ICD-10-CM

## 2019-05-05 LAB — SARS CORONAVIRUS 2 (TAT 6-24 HRS): SARS Coronavirus 2: NEGATIVE

## 2019-05-05 NOTE — Telephone Encounter (Signed)
Pt's sister Sonny Dandy) picked up [employer] and instructions. Instructions reviewed w/her.

## 2019-05-08 ENCOUNTER — Telehealth: Payer: Self-pay

## 2019-05-08 ENCOUNTER — Other Ambulatory Visit (INDEPENDENT_AMBULATORY_CARE_PROVIDER_SITE_OTHER): Payer: Self-pay | Admitting: Orthopedic Surgery

## 2019-05-08 ENCOUNTER — Other Ambulatory Visit: Payer: Self-pay | Admitting: Family Medicine

## 2019-05-08 NOTE — Progress Notes (Signed)
CC'D TO PCP °

## 2019-05-08 NOTE — Telephone Encounter (Signed)
Dr. Oneida Alar spoke to hospitalist (Dr. Dyann Kief) and accepted admission for TCS prep assistance. Pt to be admitted to [facility] tomorrow morning. Called bed placement and spoke to [place]. Informed Shawn of need for admission and request for pt to be admitted by 8:00am tomorrow. Shawn said if pt hasn't heard from bed placement by 7:30am she needs to call [phone], ext [phone].  Called and informed pt. Reminded her she can have full liquid diet today and start clear liquid diet tomorrow. Take Suprep sample to the hospital. Reminded to hold Eliquis starting today. Pt has meds packaged and not sure which pill is Eliquis. Advised her to call pharmacist to find out which pill is the Eliquis. She verbalized understanding.

## 2019-05-08 NOTE — Telephone Encounter (Signed)
Ok to rf? 

## 2019-05-09 ENCOUNTER — Other Ambulatory Visit: Payer: Self-pay

## 2019-05-09 ENCOUNTER — Observation Stay (HOSPITAL_COMMUNITY)
Admission: RE | Admit: 2019-05-09 | Discharge: 2019-05-10 | Disposition: A | Discharge status: Home / Self Care | Payer: Medicare Other | Source: Ambulatory Visit | Attending: Internal Medicine | Admitting: Internal Medicine

## 2019-05-09 ENCOUNTER — Encounter (HOSPITAL_COMMUNITY): Payer: Self-pay | Admitting: *Deleted

## 2019-05-09 DIAGNOSIS — E1140 Type 2 diabetes mellitus with diabetic neuropathy, unspecified: Secondary | ICD-10-CM | POA: Diagnosis present

## 2019-05-09 DIAGNOSIS — I130 Hypertensive heart and chronic kidney disease with heart failure and stage 1 through stage 4 chronic kidney disease, or unspecified chronic kidney disease: Secondary | ICD-10-CM | POA: Insufficient documentation

## 2019-05-09 DIAGNOSIS — D120 Benign neoplasm of cecum: Secondary | ICD-10-CM | POA: Diagnosis not present

## 2019-05-09 DIAGNOSIS — I679 Cerebrovascular disease, unspecified: Secondary | ICD-10-CM | POA: Insufficient documentation

## 2019-05-09 DIAGNOSIS — K648 Other hemorrhoids: Secondary | ICD-10-CM | POA: Insufficient documentation

## 2019-05-09 DIAGNOSIS — I5042 Chronic combined systolic (congestive) and diastolic (congestive) heart failure: Secondary | ICD-10-CM | POA: Diagnosis present

## 2019-05-09 DIAGNOSIS — Z1211 Encounter for screening for malignant neoplasm of colon: Principal | ICD-10-CM

## 2019-05-09 DIAGNOSIS — I482 Chronic atrial fibrillation, unspecified: Secondary | ICD-10-CM

## 2019-05-09 DIAGNOSIS — G8190 Hemiplegia, unspecified affecting unspecified side: Secondary | ICD-10-CM | POA: Insufficient documentation

## 2019-05-09 DIAGNOSIS — N183 Chronic kidney disease, stage 3 unspecified: Secondary | ICD-10-CM | POA: Diagnosis present

## 2019-05-09 DIAGNOSIS — Z7982 Long term (current) use of aspirin: Secondary | ICD-10-CM | POA: Diagnosis not present

## 2019-05-09 DIAGNOSIS — E1159 Type 2 diabetes mellitus with other circulatory complications: Secondary | ICD-10-CM | POA: Diagnosis present

## 2019-05-09 DIAGNOSIS — Z7902 Long term (current) use of antithrombotics/antiplatelets: Secondary | ICD-10-CM | POA: Insufficient documentation

## 2019-05-09 DIAGNOSIS — N1832 Chronic kidney disease, stage 3b: Secondary | ICD-10-CM | POA: Diagnosis present

## 2019-05-09 DIAGNOSIS — E1121 Type 2 diabetes mellitus with diabetic nephropathy: Secondary | ICD-10-CM | POA: Diagnosis not present

## 2019-05-09 DIAGNOSIS — Z7901 Long term (current) use of anticoagulants: Secondary | ICD-10-CM | POA: Diagnosis not present

## 2019-05-09 DIAGNOSIS — I480 Paroxysmal atrial fibrillation: Secondary | ICD-10-CM | POA: Diagnosis present

## 2019-05-09 DIAGNOSIS — Z79899 Other long term (current) drug therapy: Secondary | ICD-10-CM | POA: Diagnosis not present

## 2019-05-09 DIAGNOSIS — E782 Mixed hyperlipidemia: Secondary | ICD-10-CM | POA: Diagnosis present

## 2019-05-09 DIAGNOSIS — I4891 Unspecified atrial fibrillation: Secondary | ICD-10-CM | POA: Insufficient documentation

## 2019-05-09 DIAGNOSIS — N179 Acute kidney failure, unspecified: Secondary | ICD-10-CM | POA: Diagnosis present

## 2019-05-09 DIAGNOSIS — K644 Residual hemorrhoidal skin tags: Secondary | ICD-10-CM | POA: Diagnosis not present

## 2019-05-09 DIAGNOSIS — I5040 Unspecified combined systolic (congestive) and diastolic (congestive) heart failure: Secondary | ICD-10-CM | POA: Insufficient documentation

## 2019-05-09 DIAGNOSIS — R29898 Other symptoms and signs involving the musculoskeletal system: Secondary | ICD-10-CM | POA: Diagnosis present

## 2019-05-09 DIAGNOSIS — E785 Hyperlipidemia, unspecified: Secondary | ICD-10-CM

## 2019-05-09 DIAGNOSIS — I43 Cardiomyopathy in diseases classified elsewhere: Secondary | ICD-10-CM | POA: Insufficient documentation

## 2019-05-09 DIAGNOSIS — I6330 Cerebral infarction due to thrombosis of unspecified cerebral artery: Secondary | ICD-10-CM | POA: Diagnosis present

## 2019-05-09 DIAGNOSIS — F329 Major depressive disorder, single episode, unspecified: Secondary | ICD-10-CM

## 2019-05-09 DIAGNOSIS — Q438 Other specified congenital malformations of intestine: Secondary | ICD-10-CM | POA: Diagnosis not present

## 2019-05-09 DIAGNOSIS — E1122 Type 2 diabetes mellitus with diabetic chronic kidney disease: Secondary | ICD-10-CM | POA: Insufficient documentation

## 2019-05-09 DIAGNOSIS — Z8673 Personal history of transient ischemic attack (TIA), and cerebral infarction without residual deficits: Secondary | ICD-10-CM

## 2019-05-09 DIAGNOSIS — I10 Essential (primary) hypertension: Secondary | ICD-10-CM | POA: Diagnosis present

## 2019-05-09 DIAGNOSIS — Z7984 Long term (current) use of oral hypoglycemic drugs: Secondary | ICD-10-CM | POA: Insufficient documentation

## 2019-05-09 LAB — CBC
HCT: 44.5 % (ref 36.0–46.0)
Hemoglobin: 14 g/dL (ref 12.0–15.0)
MCH: 29.1 pg (ref 26.0–34.0)
MCHC: 31.5 g/dL (ref 30.0–36.0)
MCV: 92.5 fL (ref 80.0–100.0)
Platelets: 200 10*3/uL (ref 150–400)
RBC: 4.81 MIL/uL (ref 3.87–5.11)
RDW: 14.2 % (ref 11.5–15.5)
WBC: 6.1 10*3/uL (ref 4.0–10.5)
nRBC: 0 % (ref 0.0–0.2)

## 2019-05-09 LAB — COMPREHENSIVE METABOLIC PANEL
ALT: 12 U/L (ref 0–44)
AST: 13 U/L — ABNORMAL LOW (ref 15–41)
Albumin: 3.4 g/dL — ABNORMAL LOW (ref 3.5–5.0)
Alkaline Phosphatase: 121 U/L (ref 38–126)
Anion gap: 7 (ref 5–15)
BUN: 24 mg/dL — ABNORMAL HIGH (ref 6–20)
CO2: 25 mmol/L (ref 22–32)
Calcium: 9.3 mg/dL (ref 8.9–10.3)
Chloride: 110 mmol/L (ref 98–111)
Creatinine, Ser: 1.3 mg/dL — ABNORMAL HIGH (ref 0.44–1.00)
GFR calc Af Amer: 54 mL/min — ABNORMAL LOW (ref >60)
GFR calc non Af Amer: 46 mL/min — ABNORMAL LOW (ref >60)
Glucose, Bld: 181 mg/dL — ABNORMAL HIGH (ref 70–99)
Potassium: 4.1 mmol/L (ref 3.5–5.1)
Sodium: 142 mmol/L (ref 135–145)
Total Bilirubin: 0.5 mg/dL (ref 0.3–1.2)
Total Protein: 7.1 g/dL (ref 6.5–8.1)

## 2019-05-09 LAB — GLUCOSE, CAPILLARY
Glucose-Capillary: 154 mg/dL — ABNORMAL HIGH (ref 70–99)
Glucose-Capillary: 207 mg/dL — ABNORMAL HIGH (ref 70–99)
Glucose-Capillary: 232 mg/dL — ABNORMAL HIGH (ref 70–99)

## 2019-05-09 LAB — TSH: TSH: 1.393 u[IU]/mL (ref 0.350–4.500)

## 2019-05-09 LAB — HEMOGLOBIN A1C
Hgb A1c MFr Bld: 8.3 % — ABNORMAL HIGH (ref 4.8–5.6)
Mean Plasma Glucose: 191.51 mg/dL

## 2019-05-09 LAB — MAGNESIUM: Magnesium: 1.8 mg/dL (ref 1.7–2.4)

## 2019-05-09 MED ORDER — AMLODIPINE BESYLATE 5 MG PO TABS
10.0000 mg | ORAL_TABLET | Freq: Every day | ORAL | Status: DC
  Administered 2019-05-09 – 2019-05-10 (×2): 10 mg via ORAL
  Filled 2019-05-09 (×2): qty 2

## 2019-05-09 MED ORDER — MONTELUKAST SODIUM 10 MG PO TABS
5.0000 mg | ORAL_TABLET | Freq: Every day | ORAL | Status: DC
  Administered 2019-05-09: 5 mg via ORAL
  Filled 2019-05-09: qty 1

## 2019-05-09 MED ORDER — ASPIRIN EC 81 MG PO TBEC
81.0000 mg | DELAYED_RELEASE_TABLET | Freq: Every day | ORAL | Status: DC
  Administered 2019-05-09 – 2019-05-10 (×2): 81 mg via ORAL
  Filled 2019-05-09 (×2): qty 1

## 2019-05-09 MED ORDER — PANTOPRAZOLE SODIUM 40 MG PO TBEC
40.0000 mg | DELAYED_RELEASE_TABLET | Freq: Every day | ORAL | Status: DC
  Administered 2019-05-09 – 2019-05-10 (×2): 40 mg via ORAL
  Filled 2019-05-09 (×2): qty 1

## 2019-05-09 MED ORDER — ONDANSETRON HCL 4 MG PO TABS: 4.0000 mg | ORAL_TABLET | Freq: Four times a day (QID) | ORAL | Status: DC | PRN

## 2019-05-09 MED ORDER — IPRATROPIUM-ALBUTEROL 0.5-2.5 (3) MG/3ML IN SOLN: 3.0000 mL | Freq: Four times a day (QID) | RESPIRATORY_TRACT | Status: DC | PRN

## 2019-05-09 MED ORDER — CLONIDINE HCL 0.2 MG PO TABS
0.2000 mg | ORAL_TABLET | Freq: Two times a day (BID) | ORAL | Status: DC
  Administered 2019-05-09 – 2019-05-10 (×3): 0.2 mg via ORAL
  Filled 2019-05-09 (×3): qty 1

## 2019-05-09 MED ORDER — PEG 3350-KCL-NA BICARB-NACL 420 G PO SOLR: 4000.0000 mL | Freq: Once | ORAL | Status: DC

## 2019-05-09 MED ORDER — METOPROLOL SUCCINATE ER 50 MG PO TB24
50.0000 mg | ORAL_TABLET | Freq: Every day | ORAL | Status: DC
  Administered 2019-05-09 – 2019-05-10 (×2): 50 mg via ORAL
  Filled 2019-05-09 (×3): qty 1

## 2019-05-09 MED ORDER — DARIFENACIN HYDROBROMIDE ER 7.5 MG PO TB24
7.5000 mg | ORAL_TABLET | Freq: Every day | ORAL | Status: DC
  Administered 2019-05-09 – 2019-05-10 (×2): 7.5 mg via ORAL
  Filled 2019-05-09 (×2): qty 1

## 2019-05-09 MED ORDER — SODIUM CHLORIDE 0.9 % IV SOLN: 250.0000 mL | INTRAVENOUS | Status: DC | PRN

## 2019-05-09 MED ORDER — SODIUM CHLORIDE 0.9% FLUSH: 3.0000 mL | INTRAVENOUS | Status: DC | PRN

## 2019-05-09 MED ORDER — GABAPENTIN 300 MG PO CAPS
300.0000 mg | ORAL_CAPSULE | Freq: Three times a day (TID) | ORAL | Status: DC
  Administered 2019-05-09 – 2019-05-10 (×4): 300 mg via ORAL
  Filled 2019-05-09 (×4): qty 1

## 2019-05-09 MED ORDER — NA SULFATE-K SULFATE-MG SULF 17.5-3.13-1.6 GM/177ML PO SOLN: 177.0000 mL | Freq: Once | ORAL | Status: DC

## 2019-05-09 MED ORDER — EZETIMIBE 10 MG PO TABS
10.0000 mg | ORAL_TABLET | Freq: Every day | ORAL | Status: DC
  Administered 2019-05-09 – 2019-05-10 (×2): 10 mg via ORAL
  Filled 2019-05-09 (×3): qty 1

## 2019-05-09 MED ORDER — INSULIN ASPART 100 UNIT/ML ~~LOC~~ SOLN
0.0000 [IU] | Freq: Three times a day (TID) | SUBCUTANEOUS | Status: DC
  Administered 2019-05-09: 2 [IU] via SUBCUTANEOUS
  Administered 2019-05-09: 3 [IU] via SUBCUTANEOUS
  Administered 2019-05-10: 1 [IU] via SUBCUTANEOUS
  Administered 2019-05-10 (×2): 2 [IU] via SUBCUTANEOUS

## 2019-05-09 MED ORDER — ACETAMINOPHEN 650 MG RE SUPP: 650.0000 mg | Freq: Four times a day (QID) | RECTAL | Status: DC | PRN

## 2019-05-09 MED ORDER — SODIUM CHLORIDE 0.9% FLUSH
3.0000 mL | Freq: Two times a day (BID) | INTRAVENOUS | Status: DC
  Administered 2019-05-09 – 2019-05-10 (×2): 3 mL via INTRAVENOUS

## 2019-05-09 MED ORDER — ROSUVASTATIN CALCIUM 10 MG PO TABS
10.0000 mg | ORAL_TABLET | Freq: Every evening | ORAL | Status: DC
  Administered 2019-05-09 – 2019-05-10 (×2): 10 mg via ORAL
  Filled 2019-05-09 (×3): qty 1

## 2019-05-09 MED ORDER — TRAZODONE HCL 50 MG PO TABS
50.0000 mg | ORAL_TABLET | Freq: Every day | ORAL | Status: DC
  Administered 2019-05-09: 50 mg via ORAL
  Filled 2019-05-09: qty 1

## 2019-05-09 MED ORDER — INSULIN ASPART 100 UNIT/ML ~~LOC~~ SOLN
0.0000 [IU] | Freq: Every day | SUBCUTANEOUS | Status: DC
  Administered 2019-05-09: 2 [IU] via SUBCUTANEOUS

## 2019-05-09 MED ORDER — ACETAMINOPHEN 325 MG PO TABS: 650.0000 mg | ORAL_TABLET | Freq: Four times a day (QID) | ORAL | Status: DC | PRN

## 2019-05-09 MED ORDER — HYDRALAZINE HCL 20 MG/ML IJ SOLN: 10.0000 mg | Freq: Three times a day (TID) | INTRAMUSCULAR | Status: DC | PRN

## 2019-05-09 MED ORDER — DULOXETINE HCL 60 MG PO CPEP
60.0000 mg | ORAL_CAPSULE | Freq: Every day | ORAL | Status: DC
  Administered 2019-05-09 – 2019-05-10 (×2): 60 mg via ORAL
  Filled 2019-05-09 (×2): qty 1

## 2019-05-09 MED ORDER — ONDANSETRON HCL 4 MG/2ML IJ SOLN: 4.0000 mg | Freq: Four times a day (QID) | INTRAMUSCULAR | Status: DC | PRN

## 2019-05-09 MED ORDER — POTASSIUM CHLORIDE IN NACL 20-0.9 MEQ/L-% IV SOLN
INTRAVENOUS | Status: DC
  Administered 2019-05-09: 10:00:00 via INTRAVENOUS

## 2019-05-09 NOTE — Progress Notes (Addendum)
    Subjective: [name] is a 53 y.o. year old female who is presenting for admission for assistance with bowel prep for colonoscopy on 7/15. Patient has no acute complaints today. Started her liquid diet yesterday and started holding Eliquis yesterday. No upper or lower GI complaints. No chest pain, heart palpitation, shortness of breath, cough, fever, or chills.   Patient brought suprep with her today for her bowel prep.  Objective: Vital signs in last 24 hours: Temp:  [98.1 F (36.7 C)] 98.1 F (36.7 C) (07/14 0940) Pulse Rate:  [74] 74 (07/14 0940) Resp:  [18] 18 (07/14 0940) BP: (176)/(120) 176/120 (07/14 0940) SpO2:  [100 %] 100 % (07/14 0940) Weight:  [83.3 kg] 83.3 kg (07/14 0940)   General:   Alert and oriented, pleasant Head:  Normocephalic and atraumatic. Eyes:  No icterus, sclera clear. Conjuctiva pink.  Mouth:  Without lesions, mucosa pink and moist.  Heart:  S1, S2 present, no murmurs noted.  Lungs: Clear to auscultation bilaterally, without wheezing, rales, or rhonchi.  Abdomen:  Bowel sounds present, soft, non-tender, non-distended. No HSM or hernias noted. No rebound or guarding. No masses appreciated  Extremities:  Without clubbing or edema. Patients right thumb nail is coming off due to a burn.  Neurologic:  Alert and  oriented x4;  grossly normal neurologically. Skin:  Warm and dry, intact.   Psych:  Alert and cooperative. Normal mood and affect.  Lab Results: Recent Labs    05/09/19 0956  WBC 6.1  HGB 14.0  HCT 44.5  PLT 200   Assessment: 53 y.o. female with past medical history significant for CVA, hypertensive cardiomyopathy, combined CHF, HLD, HTN, T2 DM, COPD, CKD, and chronic anemia presenting today for admission for bowel prep assistance with colonoscopy scheduled for tomorrow. Patient has no acute complaints at this time and is in her usual state of health. Started liquid diet and holding eliquis yesterday. Of note patients BP is elevated;  however, she has not taken her BP medications this morning. Nursing staff was to discuss with Dr. Dyann Kief.   Patient brought Suprep with her today for her bowel prep. Will let nusing staff know patient can use suprep rather than GoLytely. Plans to start bowel prep today and proceed with TCS with Dr. Oneida Alar tomorrow.      Plan: Suprep today.  Tap water enemas tomorrow morning.  Clear liquids today NPO after midnight except for sips with meds.  Continue to hold Eliquis Hold  Tradjenta tomorrow Proceed with TCS with Dr. Oneida Alar tomorrow. The risks, benefits, and alternatives have been discussed in detail with patient. They have stated understanding and desire to proceed.    LOS: 0 days    05/09/2019, 10:23 AM   Aliene Altes, PA-C [facility]

## 2019-05-09 NOTE — H&P (Signed)
History and Physical    [name]:[id number] DOB: 05-16-1966 DOA: 05/09/2019  Referring MD/NP/PA: Dr. Oneida Alar PCP: [name], MD  Patient coming from: Home  Chief Complaint: Bowel preparation for a screening colonoscopy on 05/10/2019.  HPI: [name] is a 53 y.o. 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  Patient reports no chest pain, no palpitations, no shortness of breath, no nausea, no vomiting, no dysuria, no hematuria, no melena, no hematochezia, no new focal neurologic deficits or any other complaints. Given patient physical limitations bowel preparation will have been unsuccessful as an outpatient and she was directly admitted to accomplish that while in the hospital.  GI service is on board and colonoscopy is planned for 7/15.  Past Medical/Surgical History: Past Medical History:  Diagnosis Date  . Alcohol use   . Ankle fracture, lateral malleolus, closed 12/30/2011  . Anxiety   . Breast mass, left 12/15/2011  . Chronic anemia   . Chronic combined systolic and diastolic CHF (congestive heart failure) ([place])   . CKD (chronic kidney disease), stage III ([place])    pt denies  . Cocaine abuse ([place])   . COPD (chronic obstructive pulmonary disease) ([place])   . Diabetes mellitus, type 2 ([place])   . Diabetic Charcot foot ([place]) 12/30/2011  . Essential hypertension   . History of GI bleed 12/05/2009   Qualifier: Diagnosis of  By: Zeb Comfort    . Hyperlipidemia   . Hypertensive cardiomyopathy ([place]) 11/08/2012   a. 05/2016:  echo showing EF of 20-25% with normal cors by cath in 07/2016.  . Morbid obesity ([place]) 01/31/2008   Qualifier: Diagnosis of  By: Lenn Cal    . Noncompliance with medication regimen   . Obesity   . Panic attacks   . PAT (paroxysmal atrial tachycardia) ([place])    a. started on amiodarone 07/2017 for this.  . Sleep apnea    does not use cpap  . Stroke ([place])    06/24/17  . Tobacco abuse   . Urinary incontinence     Past Surgical History:  Procedure Laterality Date  . BREAST BIOPSY    . CARDIAC CATHETERIZATION N/A 07/28/2016   Procedure: Left Heart Cath and Coronary Angiography;  Surgeon: Jettie Booze, MD;  Location: [facility];  Service: Cardiovascular;  Laterality: N/A;  . DILATION AND CURETTAGE OF UTERUS    . I&D EXTREMITY Bilateral 09/22/2017   Procedure: BILATERAL DEBRIDEMENT LEG/FOOT ULCERS, APPLY VERAFLO WOUND VAC;  Surgeon: Newt Minion, MD;  Location: [facility];  Service: Orthopedics;  Laterality: Bilateral;  . I&D EXTREMITY Right 10/11/2018   Procedure: IRRIGATION AND DEBRIDEMENT RIGHT HAND;  Surgeon: [name], MD;  Location: [facility];  Service: Orthopedics;  Laterality: Right;  . I&D EXTREMITY Right 10/13/2018   Procedure: REPEAT IRRIGATION AND DEBRIDEMENT RIGHT HAND;  Surgeon: [name], MD;  Location: [facility];  Service: Orthopedics;  Laterality: Right;  . I&D EXTREMITY Right 11/22/2018   Procedure: IRRIGATION AND DEBRIDEMENT AND PINNING RIGHT HAND;  Surgeon: [name], MD;  Location: [facility];  Service: Orthopedics;  Laterality: Right;  . IR RADIOLOGIST EVAL & MGMT  07/05/2018  .  SKIN SPLIT GRAFT Bilateral 09/28/2017   Procedure: BILATERAL SPLIT THICKNESS SKIN GRAFT LEGS/FEET AND APPLY VAC;  Surgeon: Newt Minion, MD;  Location: [facility];  Service: Orthopedics;  Laterality: Bilateral;  . SKIN SPLIT GRAFT Right 11/22/2018   Procedure: SKIN GRAFT SPLIT THICKNESS;  Surgeon: [name], MD;  Location: [facility];  Service: Orthopedics;  Laterality: Right;     Social History:  reports that she quit smoking about 23 months ago. Her smoking use included cigarettes. She has a 20.00 pack-year smoking history. She has never used smokeless tobacco. She reports previous drug use. Frequency: 3.00 times per week. Drugs: Marijuana and Cocaine. She reports that she does not drink alcohol.  Allergies: No Known Allergies  Family History:  Family History  Problem Relation Age of Onset  . Hypertension Mother   . Heart attack Mother   . Hypertension Father        CABG   . Hypertension Sister   . Hypertension Brother   . Hypertension Sister   . Cancer Sister        breast   . Arthritis Other   . Cancer Other   . Diabetes Other   . Asthma Other   . Hypertension Daughter   . Hypertension Son     Prior to Admission medications   Medication Sig Start Date End Date Taking? Authorizing Provider  albuterol (PROAIR HFA) 108 (90 Base) MCG/ACT inhaler INHALE 2 PUFFS EVERY 6 HOURS AS NEEDED FOR SHORTNESS OF BREATH/WHEEZING. 04/25/19  Yes Perlie Mayo, NP  amLODipine (NORVASC) 10 MG tablet TAKE 1 TABLET BY MOUTH ONCE A DAY. 03/06/19  Yes [name], MD  aspirin EC 81 MG tablet Take 1 tablet (81 mg total) by mouth daily. 06/17/18 06/17/19 Yes Barton Dubois, MD  cloNIDine (CATAPRES) 0.3 MG tablet TAKE 1 TABLET BY MOUTH AT BEDTIME FOR BLOOD PRESSURE 02/09/19  Yes [name], MD  clopidogrel (PLAVIX) 75 MG tablet TAKE 1 TABLET BY MOUTH DAILY WITH BREAKFAST. 03/06/19  Yes [name], MD  DULoxetine (CYMBALTA) 30 MG capsule Take 60 mg by mouth daily.  11/21/18  Yes [provider]  ezetimibe (ZETIA) 10 MG tablet TAKE 1 TABLET BY MOUTH ONCE A DAY. 03/06/19  Yes [name], MD  furosemide (LASIX) 20 MG tablet TAKE 1 TABLET BY MOUTH ONCE A DAY. 02/07/19  Yes [name], MD  gabapentin (NEURONTIN) 300 MG capsule TAKE 1 CAPSULE BY MOUTH THREE TIMES A DAY. 05/09/19  Yes Dondra Prader R, NP  hydrOXYzine (ATARAX/VISTARIL) 25 MG  tablet TAKE 1 TABLET BY MOUTH AT BEDTIME FOR ITCHING AND SLEEP. 03/15/19  Yes [name], MD  ipratropium-albuterol (DUONEB) 0.5-2.5 (3) MG/3ML SOLN Take 3 mLs by nebulization every 6 (six) hours as needed (wheezes or SOB). 09/06/18  Yes [name], MD  metoprolol succinate (TOPROL-XL) 50 MG 24 hr tablet Take one tablet two times daily by mouth 03/15/19  Yes [name], MD  montelukast (SINGULAIR) 10 MG tablet TAKE (1) TABLET BY MOUTH AT BEDTIME. 03/06/19  Yes [name], MD  olmesartan (BENICAR) 40 MG tablet TAKE 1 TABLET BY MOUTH ONCE A DAY. 05/09/19  Yes [name], MD  pantoprazole (PROTONIX) 40 MG tablet TAKE 1 TABLET BY MOUTH ONCE A DAY. 03/06/19  Yes [name], MD  potassium chloride SA (K-DUR) 20 MEQ tablet TAKE 1&1/2 TABLET BY MOUTH DAILY. 03/06/19  Yes [name], MD  rosuvastatin (CRESTOR) 10 MG tablet TAKE 1  TABLET BY MOUTH ONCE A DAY. Patient taking differently: Take 10 mg by mouth every evening.  05/30/18  Yes [name], MD  solifenacin (VESICARE) 5 MG tablet Take 1 tablet (5 mg total) by mouth daily. 12/11/18  Yes [name], MD  tiotropium (SPIRIVA HANDIHALER) 18 MCG inhalation capsule Place 18 mcg into inhaler and inhale daily as needed (SHORTNESS OF BREATH).    Yes [provider]  TRADJENTA 5 MG TABS tablet TAKE 1 TABLET BY MOUTH ONCE DAILY. 05/09/19  Yes [name], MD  traMADol (ULTRAM) 50 MG tablet Take by mouth every 8 (eight) hours as needed.   Yes [provider]  traZODone (DESYREL) 50 MG tablet TAKE (1) TABLET BY MOUTH AT BEDTIME. 03/06/19  Yes [name], MD  amiodarone (PACERONE) 200 MG tablet Take 1 tablet (200 mg total) by mouth 2 (two) times daily. 08/14/17 03/15/19  Oswald Hillock, MD    Review of Systems:  Negative except as otherwise mentioned in HPI.   Physical Exam: Vitals:   05/09/19 0940 05/09/19 1600  BP: (!) 176/120 (!) 151/100  Pulse: 74   Resp:  18   Temp: 98.1 F (36.7 C)   TempSrc: Oral   SpO2: 100%   Weight: 83.3 kg   Height: 5\' 1"  (1.549 m)     Constitutional: NAD, calm, comfortable; denies chest pain, palpitations, nausea, vomiting, shortness of breath, bright red blood per rectum or melanotic stools. Eyes: PERRL, lids and conjunctivae normal, no icterus, no nystagmus. ENMT: Mucous membranes are moist. Posterior pharynx clear of any exudate or lesions. Neck: normal, supple, no masses, no thyromegaly Respiratory: clear to auscultation bilaterally, no wheezing, no crackles. Normal respiratory effort. No accessory muscle use.  Cardiovascular: Regular rate, no murmurs / rubs / gallops. No extremity edema. 2+ pedal pulses. No carotid bruits.  Abdomen: no tenderness, no masses palpated. No hepatosplenomegaly. Bowel sounds positive.  Musculoskeletal: no clubbing / cyanosis. No joint deformity upper and lower extremities. Good ROM, no contractures. Normal muscle tone.  Skin: no rashes, lesions, ulcers. No induration Neurologic: CN 2-12 grossly intact. Sensation intact, DTR normal.  No new focal deficit.  Patient with chronic lower extremity decrease strength as a residual effect from previous stroke; also expressed poor balance. Psychiatric: Normal judgment and insight. Alert and oriented x 3. Normal mood.    Labs on Admission: I have personally reviewed the following labs and imaging studies  CBC: Recent Labs  Lab 05/09/19 0956  WBC 6.1  HGB 14.0  HCT 44.5  MCV 92.5  PLT 765   Basic Metabolic Panel: Recent Labs  Lab 05/09/19 0956  NA 142  K 4.1  CL 110  CO2 25  GLUCOSE 181*  BUN 24*  CREATININE 1.30*  CALCIUM 9.3  MG 1.8   GFR: Estimated Creatinine Clearance: 48.4 mL/min (A) (by C-G formula based on SCr of 1.3 mg/dL (H)).   Liver Function Tests: Recent Labs  Lab 05/09/19 0956  AST 13*  ALT 12  ALKPHOS 121  BILITOT 0.5  PROT 7.1  ALBUMIN 3.4*   CBG: Recent Labs  Lab 05/09/19 1114 05/09/19 1605   GLUCAP 154* 207*   Thyroid Function Tests: Recent Labs    05/09/19 0956  TSH 1.393   Urine analysis:    Component Value Date/Time   COLORURINE AMBER (A) 07/11/2018 1609   APPEARANCEUR CLOUDY (A) 07/11/2018 1609   LABSPEC 1.013 07/11/2018 1609   PHURINE 6.0 07/11/2018 1609   GLUCOSEU 50 (A) 07/11/2018 1609  HGBUR LARGE (A) 07/11/2018 1609   BILIRUBINUR NEGATIVE 07/11/2018 1609   KETONESUR NEGATIVE 07/11/2018 1609   PROTEINUR 100 (A) 07/11/2018 1609   UROBILINOGEN 0.2 09/11/2014 1319   NITRITE NEGATIVE 07/11/2018 1609   LEUKOCYTESUR TRACE (A) 07/11/2018 1609    Recent Results (from the past 240 hour(s))  SARS Coronavirus 2 (Performed in [facility] hospital lab)     Status: None   Collection Time: 05/05/19  6:54 AM   Specimen: Nasal Swab  Result Value Ref Range Status   SARS Coronavirus 2 NEGATIVE NEGATIVE Final    Comment: (NOTE) SARS-CoV-2 target nucleic acids are NOT DETECTED. The SARS-CoV-2 RNA is generally detectable in upper and lower respiratory specimens during the acute phase of infection. Negative results do not preclude SARS-CoV-2 infection, do not rule out co-infections with other pathogens, and should not be used as the sole basis for treatment or other patient management decisions. Negative results must be combined with clinical observations, patient history, and epidemiological information. The expected result is Negative. Fact Sheet for Patients: SugarRoll.be Fact Sheet for Healthcare Providers: https://www.woods-mathews.com/ This test is not yet approved or cleared by the [place] FDA and  has been authorized for detection and/or diagnosis of SARS-CoV-2 by FDA under an Emergency Use Authorization (EUA). This EUA will remain  in effect (meaning this test can be used) for the duration of the COVID-19 declaration under Section 56 4(b)(1) of the Act, 21 U.S.C. section 360bbb-3(b)(1), unless the authorization  is terminated or revoked sooner. Performed at [facility], [place] [address], [place], [place] [address]      Radiological Exams on Admission: No results found.  EKG: none   Assessment/Plan 1-encounter for screening colonoscopy/bowel preparation -Patient with significant physical this capacity after a stroke making unable safe bowel prep as an outpatient. -Patient has been admitted for bowel preparation and colonoscopy screening -We will follow GI recommendations -Anticipated colonoscopy on 05/10/2019. -Patient reports no lightheadedness, no shortness of breath, no chest pain and no bloody or melanotic stools.   2-essential hypertension -Will continue the use of Norvasc, metoprolol and clonidine -Hold lisinopril and also diuretics while acutely providing bowel preparation for colonoscopy to minimize the chances of renal insufficiency and electrolyte disturbances. -PRN hydralazine has been ordered  3-type 2 diabetes mellitus -Hold Tradjenta while inpatient -Modified carbohydrate diet has been ordered -Will use sliding scale insulin and follow patient CBGs -Repeat A1c.  4-hyperlipidemia -Continue Zetia and Crestor  5-diabetic neuropathy -Continue the use of Neurontin.  6-depression -No suicidal ideation or hallucinations currently -Continue the use of nightly trazodone and daily Cymbalta  7-chronic kidney disease stage III -Renal function at baseline (creatinine 1.3). -Minimize nephrotoxic agents -Maintain adequate hydration.  8-chronic combined heart failure -Compensated -Follow daily weights and strict I's and O's -Holding diuretics overnight while acutely providing bowel prep for screening colonoscopy -Heart healthy diet has been ordered. -Continue beta-blocker  9-history of atrial fibrillation -Rate control -Eliquis for secondary prevention on hold since 08/08/2019 in anticipation for colonoscopy procedure -Safe to continue the use of aspirin. -Will  follow GI service recommendations for resumption of anticoagulation.  10-history of stroke -No new focal deficit appreciated -Continue risk factor modifications -Continue home health physical therapy as previously arranged.  11-history of COPD/asthma -Good oxygen saturation on room air -No wheezing on examination. -Continue PRN DuoNeb and the use of Singulair.   DVT prophylaxis: SCDs Code Status: Full code Family Communication: No family at bedside Disposition Plan: Anticipate discharge back home once colonoscopy screening completed  on 05/10/2019 Consults called: GI service Admission status: Observation, telemetry, length of stay less than 2 midnights.   Time Spent: 60 minutes  Barton Dubois MD [facility] Pager [phone]  05/09/2019, 5:48 PM

## 2019-05-09 NOTE — Telephone Encounter (Signed)
Tried to call pt to see if she had heard from bed placement, no answer. Called bed placement and spoke to [place]. She stated bed placement spoke to pt approx 7:00am and she was going to get ready and go to hospital. She will be going to room 309. Informed AB.

## 2019-05-09 NOTE — H&P (View-Only) (Signed)
    Subjective: [name] is a 53 y.o. year old female who is presenting for admission for assistance with bowel prep for colonoscopy on 7/15. Patient has no acute complaints today. Started her liquid diet yesterday and started holding Eliquis yesterday. No upper or lower GI complaints. No chest pain, heart palpitation, shortness of breath, cough, fever, or chills.   Patient brought suprep with her today for her bowel prep.  Objective: Vital signs in last 24 hours: Temp:  [98.1 F (36.7 C)] 98.1 F (36.7 C) (07/14 0940) Pulse Rate:  [74] 74 (07/14 0940) Resp:  [18] 18 (07/14 0940) BP: (176)/(120) 176/120 (07/14 0940) SpO2:  [100 %] 100 % (07/14 0940) Weight:  [83.3 kg] 83.3 kg (07/14 0940)   General:   Alert and oriented, pleasant Head:  Normocephalic and atraumatic. Eyes:  No icterus, sclera clear. Conjuctiva pink.  Mouth:  Without lesions, mucosa pink and moist.  Heart:  S1, S2 present, no murmurs noted.  Lungs: Clear to auscultation bilaterally, without wheezing, rales, or rhonchi.  Abdomen:  Bowel sounds present, soft, non-tender, non-distended. No HSM or hernias noted. No rebound or guarding. No masses appreciated  Extremities:  Without clubbing or edema. Patients right thumb nail is coming off due to a burn.  Neurologic:  Alert and  oriented x4;  grossly normal neurologically. Skin:  Warm and dry, intact.   Psych:  Alert and cooperative. Normal mood and affect.  Lab Results: Recent Labs    05/09/19 0956  WBC 6.1  HGB 14.0  HCT 44.5  PLT 200   Assessment: 53 y.o. female with past medical history significant for CVA, hypertensive cardiomyopathy, combined CHF, HLD, HTN, T2 DM, COPD, CKD, and chronic anemia presenting today for admission for bowel prep assistance with colonoscopy scheduled for tomorrow. Patient has no acute complaints at this time and is in her usual state of health. Started liquid diet and holding eliquis yesterday. Of note patients BP is elevated;  however, she has not taken her BP medications this morning. Nursing staff was to discuss with Dr. Dyann Kief.   Patient brought Suprep with her today for her bowel prep. Will let nusing staff know patient can use suprep rather than GoLytely. Plans to start bowel prep today and proceed with TCS with Dr. Oneida Alar tomorrow.      Plan: Suprep today.  Tap water enemas tomorrow morning.  Clear liquids today NPO after midnight except for sips with meds.  Continue to hold Eliquis Hold  Tradjenta tomorrow Proceed with TCS with Dr. Oneida Alar tomorrow. The risks, benefits, and alternatives have been discussed in detail with patient. They have stated understanding and desire to proceed.    LOS: 0 days    05/09/2019, 10:23 AM   Aliene Altes, PA-C [facility]

## 2019-05-10 ENCOUNTER — Encounter (HOSPITAL_COMMUNITY)
Admission: RE | Disposition: A | Discharge status: Home / Self Care | Payer: Self-pay | Source: Ambulatory Visit | Attending: Internal Medicine

## 2019-05-10 DIAGNOSIS — K648 Other hemorrhoids: Secondary | ICD-10-CM | POA: Diagnosis not present

## 2019-05-10 DIAGNOSIS — K644 Residual hemorrhoidal skin tags: Secondary | ICD-10-CM | POA: Diagnosis not present

## 2019-05-10 DIAGNOSIS — E785 Hyperlipidemia, unspecified: Secondary | ICD-10-CM | POA: Diagnosis not present

## 2019-05-10 DIAGNOSIS — E1159 Type 2 diabetes mellitus with other circulatory complications: Secondary | ICD-10-CM | POA: Diagnosis not present

## 2019-05-10 DIAGNOSIS — I5042 Chronic combined systolic (congestive) and diastolic (congestive) heart failure: Secondary | ICD-10-CM | POA: Diagnosis not present

## 2019-05-10 DIAGNOSIS — G8190 Hemiplegia, unspecified affecting unspecified side: Secondary | ICD-10-CM | POA: Diagnosis not present

## 2019-05-10 DIAGNOSIS — I130 Hypertensive heart and chronic kidney disease with heart failure and stage 1 through stage 4 chronic kidney disease, or unspecified chronic kidney disease: Secondary | ICD-10-CM | POA: Diagnosis not present

## 2019-05-10 DIAGNOSIS — E1121 Type 2 diabetes mellitus with diabetic nephropathy: Secondary | ICD-10-CM | POA: Diagnosis not present

## 2019-05-10 DIAGNOSIS — I4891 Unspecified atrial fibrillation: Secondary | ICD-10-CM | POA: Diagnosis not present

## 2019-05-10 DIAGNOSIS — N183 Chronic kidney disease, stage 3 (moderate): Secondary | ICD-10-CM | POA: Diagnosis not present

## 2019-05-10 DIAGNOSIS — Q438 Other specified congenital malformations of intestine: Secondary | ICD-10-CM | POA: Diagnosis not present

## 2019-05-10 DIAGNOSIS — E1140 Type 2 diabetes mellitus with diabetic neuropathy, unspecified: Secondary | ICD-10-CM | POA: Diagnosis not present

## 2019-05-10 DIAGNOSIS — Z7984 Long term (current) use of oral hypoglycemic drugs: Secondary | ICD-10-CM | POA: Diagnosis not present

## 2019-05-10 DIAGNOSIS — Z1211 Encounter for screening for malignant neoplasm of colon: Secondary | ICD-10-CM | POA: Diagnosis not present

## 2019-05-10 DIAGNOSIS — D120 Benign neoplasm of cecum: Secondary | ICD-10-CM | POA: Diagnosis not present

## 2019-05-10 DIAGNOSIS — Z7982 Long term (current) use of aspirin: Secondary | ICD-10-CM | POA: Diagnosis not present

## 2019-05-10 DIAGNOSIS — Z79899 Other long term (current) drug therapy: Secondary | ICD-10-CM | POA: Diagnosis not present

## 2019-05-10 DIAGNOSIS — K635 Polyp of colon: Secondary | ICD-10-CM | POA: Diagnosis not present

## 2019-05-10 DIAGNOSIS — E1122 Type 2 diabetes mellitus with diabetic chronic kidney disease: Secondary | ICD-10-CM | POA: Diagnosis not present

## 2019-05-10 DIAGNOSIS — Z7901 Long term (current) use of anticoagulants: Secondary | ICD-10-CM | POA: Diagnosis not present

## 2019-05-10 DIAGNOSIS — I679 Cerebrovascular disease, unspecified: Secondary | ICD-10-CM | POA: Diagnosis not present

## 2019-05-10 DIAGNOSIS — Z7902 Long term (current) use of antithrombotics/antiplatelets: Secondary | ICD-10-CM | POA: Diagnosis not present

## 2019-05-10 DIAGNOSIS — I43 Cardiomyopathy in diseases classified elsewhere: Secondary | ICD-10-CM | POA: Diagnosis not present

## 2019-05-10 DIAGNOSIS — I5040 Unspecified combined systolic (congestive) and diastolic (congestive) heart failure: Secondary | ICD-10-CM | POA: Diagnosis not present

## 2019-05-10 HISTORY — PX: COLONOSCOPY: SHX5424

## 2019-05-10 HISTORY — PX: POLYPECTOMY: SHX5525

## 2019-05-10 LAB — GLUCOSE, CAPILLARY
Glucose-Capillary: 159 mg/dL — ABNORMAL HIGH (ref 70–99)
Glucose-Capillary: 169 mg/dL — ABNORMAL HIGH (ref 70–99)
Glucose-Capillary: 167 mg/dL — ABNORMAL HIGH (ref 70–99)
Glucose-Capillary: 123 mg/dL — ABNORMAL HIGH (ref 70–99)

## 2019-05-10 SURGERY — COLONOSCOPY
Anesthesia: Moderate Sedation

## 2019-05-10 MED ORDER — MIDAZOLAM HCL 5 MG/5ML IJ SOLN
INTRAMUSCULAR | Status: DC | PRN
  Administered 2019-05-10: 2 mg via INTRAVENOUS

## 2019-05-10 MED ORDER — SODIUM CHLORIDE 0.9 % IV SOLN
INTRAVENOUS | Status: DC
  Administered 2019-05-10: 14:00:00 via INTRAVENOUS

## 2019-05-10 MED ORDER — PROMETHAZINE HCL 25 MG/ML IJ SOLN
INTRAMUSCULAR | Status: AC
  Filled 2019-05-10: qty 1

## 2019-05-10 MED ORDER — MEPERIDINE HCL 100 MG/ML IJ SOLN
INTRAMUSCULAR | Status: AC
  Filled 2019-05-10: qty 2

## 2019-05-10 MED ORDER — MEPERIDINE HCL 100 MG/ML IJ SOLN
INTRAMUSCULAR | Status: DC | PRN
  Administered 2019-05-10: 50 mg

## 2019-05-10 MED ORDER — MIDAZOLAM HCL 5 MG/5ML IJ SOLN
INTRAMUSCULAR | Status: AC
  Filled 2019-05-10: qty 10

## 2019-05-10 MED ORDER — PROMETHAZINE HCL 25 MG/ML IJ SOLN
INTRAMUSCULAR | Status: DC | PRN
  Administered 2019-05-10: 12.5 mg via INTRAVENOUS

## 2019-05-10 MED ORDER — SODIUM CHLORIDE 0.9% FLUSH
INTRAVENOUS | Status: AC
  Filled 2019-05-10: qty 10

## 2019-05-10 NOTE — Discharge Instructions (Signed)
ONE small polyp removed. SHE HAS internal and external hemorrhoids.     Colonoscopy Care After Read the instructions outlined below and refer to this sheet in the next week. These discharge instructions provide you with general information on caring for yourself after you leave the hospital. While your treatment has been planned according to the most current medical practices available, unavoidable complications occasionally occur. If you have any problems or questions after discharge, call DR. Ether Goebel, [phone].  ACTIVITY  You may resume your regular activity, but move at a slower pace for the next 24 hours.   Take frequent rest periods for the next 24 hours.   Walking will help get rid of the air and reduce the bloated feeling in your belly (abdomen).   No driving for 24 hours (because of the medicine (anesthesia) used during the test).   You may shower.   Do not sign any important legal documents or operate any machinery for 24 hours (because of the anesthesia used during the test).    NUTRITION  Drink plenty of fluids.   You may resume your normal diet as instructed by your doctor.   Begin with a light meal and progress to your normal diet. Heavy or fried foods are harder to digest and may make you feel sick to your stomach (nauseated).   Avoid alcoholic beverages for 24 hours or as instructed.    MEDICATIONS  You may resume your normal medications.   WHAT YOU CAN EXPECT TODAY  Some feelings of bloating in the abdomen.   Passage of more gas than usual.   Spotting of blood in your stool or on the toilet paper  .  IF YOU HAD POLYPS REMOVED DURING THE COLONOSCOPY:  Eat a soft diet IF YOU HAVE NAUSEA, BLOATING, ABDOMINAL PAIN, OR VOMITING.    FINDING OUT THE RESULTS OF YOUR TEST Not all test results are available during your visit. DR. Oneida Alar WILL CALL YOU WITHIN 14 DAYS OF YOUR PROCEDUE WITH YOUR RESULTS. Do not assume everything is normal if you have not  heard from DR. Lilibeth Opie, CALL HER OFFICE AT [phone].  SEEK IMMEDIATE MEDICAL ATTENTION AND CALL THE OFFICE: [phone] IF:  You have more than a spotting of blood in your stool.   Your belly is swollen (abdominal distention).   You are nauseated or vomiting.   You have a temperature over 101F.   You have abdominal pain or discomfort that is severe or gets worse throughout the day.   High-Fiber Diet A high-fiber diet changes your normal diet to include more whole grains, legumes, fruits, and vegetables. Changes in the diet involve replacing refined carbohydrates with unrefined foods. The calorie level of the diet is essentially unchanged. The Dietary Reference Intake (recommended amount) for adult males is 38 grams per day. For adult females, it is 25 grams per day. Pregnant and lactating women should consume 28 grams of fiber per day. Fiber is the intact part of a plant that is not broken down during digestion. Functional fiber is fiber that has been isolated from the plant to provide a beneficial effect in the body. PURPOSE  Increase stool bulk.   Ease and regulate bowel movements.   Lower cholesterol.   REDUCE RISK OF COLON CANCER  INDICATIONS THAT YOU NEED MORE FIBER  Constipation and hemorrhoids.   Uncomplicated diverticulosis (intestine condition) and irritable bowel syndrome.   Weight management.   As a protective measure against hardening of the arteries (atherosclerosis), diabetes, and cancer.  GUIDELINES FOR INCREASING FIBER IN THE DIET  Start adding fiber to the diet slowly. A gradual increase of about 5 more grams (2 slices of whole-wheat bread, 2 servings of most fruits or vegetables, or 1 bowl of high-fiber cereal) per day is best. Too rapid an increase in fiber may result in constipation, flatulence, and bloating.   Drink enough water and fluids to keep your urine clear or pale yellow. Water, juice, or caffeine-free drinks are recommended. Not drinking  enough fluid may cause constipation.   Eat a variety of high-fiber foods rather than one type of fiber.   Try to increase your intake of fiber through using high-fiber foods rather than fiber pills or supplements that contain small amounts of fiber.   The goal is to change the types of food eaten. Do not supplement your present diet with high-fiber foods, but replace foods in your present diet.   INCLUDE A VARIETY OF FIBER SOURCES  Replace refined and processed grains with whole grains, canned fruits with fresh fruits, and incorporate other fiber sources. White rice, white breads, and most bakery goods contain little or no fiber.   Brown whole-grain rice, buckwheat oats, and many fruits and vegetables are all good sources of fiber. These include: broccoli, Brussels sprouts, cabbage, cauliflower, beets, sweet potatoes, white potatoes (skin on), carrots, tomatoes, eggplant, squash, berries, fresh fruits, and dried fruits.   Cereals appear to be the richest source of fiber. Cereal fiber is found in whole grains and bran. Bran is the fiber-rich outer coat of cereal grain, which is largely removed in refining. In whole-grain cereals, the bran remains. In breakfast cereals, the largest amount of fiber is found in those with "bran" in their names. The fiber content is sometimes indicated on the label.   You may need to include additional fruits and vegetables each day.   In baking, for 1 cup white flour, you may use the following substitutions:   1 cup whole-wheat flour minus 2 tablespoons.   1/2 cup white flour plus 1/2 cup whole-wheat flour.   Polyps, Colon  A polyp is extra tissue that grows inside your body. Colon polyps grow in the large intestine. The large intestine, also called the colon, is part of your digestive system. It is a long, hollow tube at the end of your digestive tract where your body makes and stores stool. Most polyps are not dangerous. They are benign. This means they are  not cancerous. But over time, some types of polyps can turn into cancer. Polyps that are smaller than a pea are usually not harmful. But larger polyps could someday become or may already be cancerous. To be safe, doctors remove all polyps and test them.   PREVENTION There is not one sure way to prevent polyps. You might be able to lower your risk of getting them if you:  Eat more fruits and vegetables and less fatty food.   Do not smoke.   Avoid alcohol.   Exercise every day.   Lose weight if you are overweight.   Eating more calcium and folate can also lower your risk of getting polyps. Some foods that are rich in calcium are milk, cheese, and broccoli. Some foods that are rich in folate are chickpeas, kidney beans, and spinach.

## 2019-05-10 NOTE — Discharge Summary (Signed)
Physician Discharge Summary  [name] DXI:[id number] DOB: Jan 13, 1966 DOA: 05/09/2019  PCP: [name], MD  Admit date: 05/09/2019 Discharge date: 05/10/2019  Admitted From: Home Disposition:  Home   Recommendations for Outpatient Follow-up:  1. Follow up with PCP in 1-2 weeks 2. Please obtain BMP/CBC in one week     Discharge Condition: Stable CODE STATUS: FULL Diet recommendation: Heart Healthy   Brief/Interim Summary: 53 year old female with a history of stroke, systolic and diastolic CHF, hyperlipidemia, hypertension, diabetes mellitus type 2, COPD, CKD stage III, paroxysmal atrial fibrillation on apixaban presenting for admission secondary to needing assistance with bowel prep for colonoscopy on 05/10/2019.  The patient's apixaban has been held since 05/08/2019 and she was maintained on a clear liquid diet since on 08/08/2019.  She underwent a bowel prep and subsequently had a colonoscopy on 05/10/2019.  The colonoscopy showed a single polyp at the ileocecal valve which was removed with a cold snare.  There was a tortuous colon with internal and external hemorrhoids.  Otherwise it was unremarkable.  The case was discussed with Dr. Oneida Alar who cleared the patient for discharge after colonoscopy.  Discharge Diagnoses:  encounter for screening colonoscopy/bowel preparation -Patient with significant physical this capacity after a stroke making unable safe bowel prep as an outpatient. -Patient has been admitted for bowel preparation and colonoscopy screening -colonoscopy on 05/10/2019-- single polyp at the ileocecal valve which was removed with a cold snare.  There was a tortuous colon with internal and external hemorrhoids.  Otherwise it was unremarkable.   -Patient reports no lightheadedness, no shortness of breath, no chest pain and no bloody or melanotic stools.   essential hypertension -continue the use of Norvasc, metoprolol and clonidine -Hold lisinopril and also  diuretics while acutely providing bowel preparation for colonoscopy to minimize the chances of renal insufficiency and electrolyte disturbances. -PRN hydralazine has been ordered  type 2 diabetes mellitus -Hold Tradjenta while inpatient -Modified carbohydrate diet has been ordered -Will use sliding scale insulin and follow patient CBGs -Repeat A1c-8.3 -f/u with PCP for continued adjustment of diabetic regimen  hyperlipidemia -Continue Zetia and Crestor  diabetic neuropathy -Continue the use of Neurontin.  depression -No suicidal ideation or hallucinations currently -Continue the use of nightly trazodone and daily Cymbalta  chronic kidney disease stage III -Renal function at baseline (creatinine 1.3-1.4). -Minimize nephrotoxic agents -Maintain adequate hydration.  chronic combined heart failure -Compensated -Follow daily weights and strict I's and O's -Holding diuretics overnight while acutely providing bowel prep for screening colonoscopy -Heart healthy diet has been ordered. -Continue beta-blocker  atrial fibrillation--type unspecified -Rate control -Eliquis for secondary prevention on hold since 08/08/2019 in anticipation for colonoscopy procedure--resume after discharge -Safe to continue the use of aspirin.  history of stroke -No new focal deficit appreciated -Continue risk factor modifications -Continue home health physical therapy as previously arranged.  COPD/asthma -Good oxygen saturation on room air -No wheezing on examination. -Continue PRN DuoNeb and the use of Singulair.   Discharge Instructions   Allergies as of 05/10/2019   No Known Allergies     Medication List    TAKE these medications   albuterol 108 (90 Base) MCG/ACT inhaler Commonly known as: ProAir HFA INHALE 2 PUFFS EVERY 6 HOURS AS NEEDED FOR SHORTNESS OF BREATH/WHEEZING.   amLODipine 10 MG tablet Commonly known as: NORVASC TAKE 1 TABLET BY MOUTH ONCE A DAY.   aspirin EC 81  MG tablet Take 1 tablet (81 mg total) by mouth daily.   cloNIDine 0.3  MG tablet Commonly known as: CATAPRES TAKE 1 TABLET BY MOUTH AT BEDTIME FOR BLOOD PRESSURE   clopidogrel 75 MG tablet Commonly known as: PLAVIX TAKE 1 TABLET BY MOUTH DAILY WITH BREAKFAST.   DULoxetine 30 MG capsule Commonly known as: CYMBALTA Take 60 mg by mouth daily.   ezetimibe 10 MG tablet Commonly known as: ZETIA TAKE 1 TABLET BY MOUTH ONCE A DAY.   furosemide 20 MG tablet Commonly known as: LASIX TAKE 1 TABLET BY MOUTH ONCE A DAY.   gabapentin 300 MG capsule Commonly known as: NEURONTIN TAKE 1 CAPSULE BY MOUTH THREE TIMES A DAY.   hydrOXYzine 25 MG tablet Commonly known as: ATARAX/VISTARIL TAKE 1 TABLET BY MOUTH AT BEDTIME FOR ITCHING AND SLEEP.   ipratropium-albuterol 0.5-2.5 (3) MG/3ML Soln Commonly known as: DUONEB Take 3 mLs by nebulization every 6 (six) hours as needed (wheezes or SOB).   metoprolol succinate 50 MG 24 hr tablet Commonly known as: TOPROL-XL Take one tablet two times daily by mouth   montelukast 10 MG tablet Commonly known as: SINGULAIR TAKE (1) TABLET BY MOUTH AT BEDTIME.   olmesartan 40 MG tablet Commonly known as: BENICAR TAKE 1 TABLET BY MOUTH ONCE A DAY.   pantoprazole 40 MG tablet Commonly known as: PROTONIX TAKE 1 TABLET BY MOUTH ONCE A DAY.   potassium chloride SA 20 MEQ tablet Commonly known as: K-DUR TAKE 1&1/2 TABLET BY MOUTH DAILY.   rosuvastatin 10 MG tablet Commonly known as: CRESTOR TAKE 1 TABLET BY MOUTH ONCE A DAY. What changed: when to take this   solifenacin 5 MG tablet Commonly known as: VESIcare Take 1 tablet (5 mg total) by mouth daily.   Spiriva HandiHaler 18 MCG inhalation capsule Generic drug: tiotropium Place 18 mcg into inhaler and inhale daily as needed (SHORTNESS OF BREATH).   Tradjenta 5 MG Tabs tablet Generic drug: linagliptin TAKE 1 TABLET BY MOUTH ONCE DAILY.   traMADol 50 MG tablet Commonly known as: ULTRAM Take by  mouth every 8 (eight) hours as needed.   traZODone 50 MG tablet Commonly known as: DESYREL TAKE (1) TABLET BY MOUTH AT BEDTIME.       No Known Allergies  Consultations:  GI   Procedures/Studies: No results found.      Discharge Exam: Vitals:   05/10/19 1515 05/10/19 1520  BP:  (!) 127/96  Pulse:  60  Resp: 19 12  Temp:    SpO2:  97%   Vitals:   05/10/19 1505 05/10/19 1510 05/10/19 1515 05/10/19 1520  BP: (!) 142/102 (!) 132/92  (!) 127/96  Pulse: 66 66  60  Resp: 12 11 19 12   Temp:      TempSrc:      SpO2: 100% 100%  97%  Weight:      Height:        General: Pt is alert, awake, not in acute distress Cardiovascular: RRR, S1/S2 +, no rubs, no gallops Respiratory: CTA bilaterally, no wheezing, no rhonchi Abdominal: Soft, NT, ND, bowel sounds + Extremities: no edema, no cyanosis   The results of significant diagnostics from this hospitalization (including imaging, microbiology, ancillary and laboratory) are listed below for reference.    Significant Diagnostic Studies: No results found.   Microbiology: Recent Results (from the past 240 hour(s))  SARS Coronavirus 2 (Performed in [facility] hospital lab)     Status: None   Collection Time: 05/05/19  6:54 AM   Specimen: Nasal Swab  Result Value Ref Range Status   SARS Coronavirus  2 NEGATIVE NEGATIVE Final    Comment: (NOTE) SARS-CoV-2 target nucleic acids are NOT DETECTED. The SARS-CoV-2 RNA is generally detectable in upper and lower respiratory specimens during the acute phase of infection. Negative results do not preclude SARS-CoV-2 infection, do not rule out co-infections with other pathogens, and should not be used as the sole basis for treatment or other patient management decisions. Negative results must be combined with clinical observations, patient history, and epidemiological information. The expected result is Negative. Fact Sheet for Patients: SugarRoll.be  Fact Sheet for Healthcare Providers: https://www.woods-mathews.com/ This test is not yet approved or cleared by the [place] FDA and  has been authorized for detection and/or diagnosis of SARS-CoV-2 by FDA under an Emergency Use Authorization (EUA). This EUA will remain  in effect (meaning this test can be used) for the duration of the COVID-19 declaration under Section 56 4(b)(1) of the Act, 21 U.S.C. section 360bbb-3(b)(1), unless the authorization is terminated or revoked sooner. Performed at [facility], [place] [address] [place], [place] [address]      Labs: Basic Metabolic Panel: Recent Labs  Lab 05/09/19 0956  NA 142  K 4.1  CL 110  CO2 25  GLUCOSE 181*  BUN 24*  CREATININE 1.30*  CALCIUM 9.3  MG 1.8   Liver Function Tests: Recent Labs  Lab 05/09/19 0956  AST 13*  ALT 12  ALKPHOS 121  BILITOT 0.5  PROT 7.1  ALBUMIN 3.4*   No results for input(s): LIPASE, AMYLASE in the last 168 hours. No results for input(s): AMMONIA in the last 168 hours. CBC: Recent Labs  Lab 05/09/19 0956  WBC 6.1  HGB 14.0  HCT 44.5  MCV 92.5  PLT 200   Cardiac Enzymes: No results for input(s): CKTOTAL, CKMB, CKMBINDEX, TROPONINI in the last 168 hours. BNP: Invalid input(s): POCBNP CBG: Recent Labs  Lab 05/09/19 1605 05/09/19 2041 05/10/19 0747 05/10/19 1131 05/10/19 1358  GLUCAP 207* 232* 159* 169* 167*    Time coordinating discharge:  36 minutes  Signed:  Orson Eva, DO [facility] Pager: [phone] 05/10/2019, 3:31 PM

## 2019-05-10 NOTE — Progress Notes (Signed)
Patient tolerated bowel prep. She does not know if she is clear.  According to documentation she had [place] 7 clear stool last night. Nurse to report tap water enema results. Plans for colonoscopy today.   Laureen Ochs. Bernarda Caffey [facility] [phone] 7/15/20209:03 AM

## 2019-05-10 NOTE — Care Management Obs Status (Signed)
[facility] NOTIFICATION   Patient Details  Name: [name] MRN: [id number] Date of Birth: 1966-02-08   Medicare Observation Status Notification Given:  Yes    Ihor Gully, LCSW 05/10/2019, 4:13 PM

## 2019-05-10 NOTE — Op Note (Signed)
[facility] Patient Name: [name] Procedure Date: 05/10/2019 2:17 PM MRN: [id number] Date of Birth: [birth year] Attending MD: Barney Drain MD, MD CSN: [id number] Age: 53 Admit Type: Inpatient Procedure:                Colonoscopy WITH COLD SNARE POLYPECTOMY Indications:              Screening for colorectal malignant neoplasm Providers:                Barney Drain MD, MD, Janeece Riggers, RN, Nelma Rothman,                            Technician Referring MD:             Norwood Levo. Simpson MD, MD Medicines:                Promethazine 12.5 mg IV, Meperidine 50 mg IV,                            Midazolam 2 mg IV Complications:            No immediate complications. Estimated Blood Loss:     Estimated blood loss was minimal. Procedure:                Pre-Anesthesia Assessment:                           - Prior to the procedure, a History and Physical                            was performed, and patient medications and                            allergies were reviewed. The patient's tolerance of                            previous anesthesia was also reviewed. The risks                            and benefits of the procedure and the sedation                            options and risks were discussed with the patient.                            All questions were answered, and informed consent                            was obtained. Prior Anticoagulants: The patient has                            taken Plavix (clopidogrel), last dose was 2 days                            prior to procedure. ASA Grade Assessment: II - A  patient with mild systemic disease. After reviewing                            the risks and benefits, the patient was deemed in                            satisfactory condition to undergo the procedure.                            After obtaining informed consent, the colonoscope                            was passed under direct vision.  Throughout the                            procedure, the patient's blood pressure, pulse, and                            oxygen saturations were monitored continuously. The                            [id number]-[id number] ([id number]) scope was introduced through                            the anus and advanced to the the cecum, identified                            by appendiceal orifice and ileocecal valve. The                            colonoscopy was technically difficult and complex                            due to significant looping and a tortuous colon.                            Successful completion of the procedure was aided by                            straightening and shortening the scope to obtain                            bowel loop reduction and COLOWRAP. The patient                            tolerated the procedure well. The quality of the                            bowel preparation was good. The ileocecal valve,                            appendiceal orifice, and rectum were photographed. Scope In: 2:42:59 PM Scope Out: 3:06:20 PM Scope Withdrawal Time: 0 hours  16 minutes 42 seconds  Total Procedure Duration: 0 hours 23 minutes 21 seconds  Findings:      A 4 mm polyp was found in the ileocecal valve. The polyp was sessile.       The polyp was removed with a cold snare. Resection and retrieval were       complete.      The recto-sigmoid colon, sigmoid colon, descending colon and splenic       flexure were moderately tortuous.      External and internal hemorrhoids were found. Impression:               - One 4 mm polyp at the ileocecal valve, removed                            with a cold snare. Resected and retrieved.                           - Tortuous colon.                           - External and internal hemorrhoids. Moderate Sedation:      Moderate (conscious) sedation was administered by the endoscopy nurse       and supervised by the endoscopist. The following  parameters were       monitored: oxygen saturation, heart rate, blood pressure, and response       to care. Total physician intraservice time was 36 minutes. Recommendation:           - Patient has a contact number available for                            emergencies. The signs and symptoms of potential                            delayed complications were discussed with the                            patient. Return to normal activities tomorrow.                            Written discharge instructions were provided to the                            patient.                           - High fiber diet.                           - Continue present medications.                           - Await pathology results.                           - Repeat colonoscopy in 5-10 years for surveillance. Procedure Code(s):        --- Professional ---  [phone], Colonoscopy, flexible; with removal of                            tumor(s), polyp(s), or other lesion(s) by snare                            technique                           99153, Moderate sedation; each additional 15                            minutes intraservice time                           G0500, Moderate sedation services provided by the                            same physician or other qualified health care                            professional performing a gastrointestinal                            endoscopic service that sedation supports,                            requiring the presence of an independent trained                            observer to assist in the monitoring of the                            patient's level of consciousness and physiological                            status; initial 15 minutes of intra-service time;                            patient age 65 years or older (additional time may                            be reported with [phone], as appropriate) Diagnosis Code(s):         --- Professional ---                           Z12.11, Encounter for screening for malignant                            neoplasm of colon                           K63.5, Polyp of colon                           K64.8, Other hemorrhoids  Q43.8, Other specified congenital malformations of                            intestine CPT copyright 2019 American Medical Association. All rights reserved. The codes documented in this report are preliminary and upon coder review may  be revised to meet current compliance requirements. Barney Drain, MD Barney Drain MD, MD 05/10/2019 3:20:04 PM This report has been signed electronically. Number of Addenda: 0

## 2019-05-10 NOTE — Interval H&P Note (Signed)
History and Physical Interval Note: NO CHANGE. DISCUSSED PROCEDURE, BENEFITS, & RISKS: < 1% chance of medication reaction, bleeding, perforation, ASPIRATION, or rupture of spleen/liver requiring surgery to fix it and missed polyps < 1 cm 10-20% of the time.  05/10/2019 2:20 PM  [name]  has presented today for surgery, with the diagnosis of screening for colon cancer.  The various methods of treatment have been discussed with the patient and family. After consideration of risks, benefits and other options for treatment, the patient has consented to  Procedure(s) with comments: COLONOSCOPY (N/A) - Phenergan 12.5 mg IV in pre-op as a surgical intervention.  The patient's history has been reviewed, patient examined, no change in status, stable for surgery.  I have reviewed the patient's chart and labs.  Questions were answered to the patient's satisfaction.     [employer]

## 2019-05-11 ENCOUNTER — Telehealth: Payer: Self-pay | Admitting: *Deleted

## 2019-05-11 NOTE — Telephone Encounter (Signed)
Melanie with [facility] called said Myrene had reached out to [facility] to get home health services. [facility] needed more clinical information on this as to what she would need and if there was clinical documentation to support this. She can be reached at [phone]

## 2019-05-12 ENCOUNTER — Other Ambulatory Visit: Payer: Self-pay | Admitting: Family Medicine

## 2019-05-12 NOTE — Telephone Encounter (Signed)
Information faxed to [facility]

## 2019-05-13 DIAGNOSIS — I509 Heart failure, unspecified: Secondary | ICD-10-CM | POA: Diagnosis not present

## 2019-05-13 DIAGNOSIS — R062 Wheezing: Secondary | ICD-10-CM | POA: Diagnosis not present

## 2019-05-15 ENCOUNTER — Encounter (HOSPITAL_COMMUNITY): Payer: Self-pay | Admitting: Gastroenterology

## 2019-05-15 NOTE — Progress Notes (Signed)
Called, many rings and no answer.

## 2019-05-16 NOTE — Progress Notes (Signed)
Pt is aware.  

## 2019-05-17 ENCOUNTER — Encounter: Payer: Medicare Other | Admitting: Family Medicine

## 2019-05-18 ENCOUNTER — Other Ambulatory Visit: Payer: Self-pay

## 2019-05-18 ENCOUNTER — Telehealth: Payer: Self-pay | Admitting: *Deleted

## 2019-05-18 DIAGNOSIS — I10 Essential (primary) hypertension: Secondary | ICD-10-CM

## 2019-05-18 DIAGNOSIS — E1159 Type 2 diabetes mellitus with other circulatory complications: Secondary | ICD-10-CM

## 2019-05-18 DIAGNOSIS — Z7409 Other reduced mobility: Secondary | ICD-10-CM

## 2019-05-18 MED ORDER — HYDROXYZINE HCL 25 MG PO TABS: ORAL_TABLET | ORAL | 0 refills | Status: DC

## 2019-05-18 NOTE — Telephone Encounter (Signed)
Misty with [facility] in [place] called needing a refill for Ms.Neely on hydroxyzine 25 mg 1 tablet at bedtime

## 2019-05-18 NOTE — Telephone Encounter (Signed)
Hydroxyzine sent in to [facility]

## 2019-05-19 ENCOUNTER — Telehealth: Payer: Self-pay | Admitting: *Deleted

## 2019-05-19 NOTE — Patient Outreach (Signed)
[name] Care Management  05/19/2019  [name] 06/18/1966 [id number]   CSW had received referral from [place], [place] for assistance with in-home care. CSW called & spoke with patient who is very familiar to this CSW. Patient states that she lives alone with her [name] and is very happy to not be in a SNF but would like some assistance. CSW spoke with patient about [facility] and [facility] ([facility]), patient states that she had heard of [facility] and expressed interest in CSW making referral. CSW called & spoke with Noemi at [facility] to make the referral and she will reach out to patient. CSW will follow-up in 2 weeks to ensure that contact has been made.    Raynaldo Opitz, LCSW [facility]  Clinical Social Worker cell #: [phone]

## 2019-05-24 ENCOUNTER — Other Ambulatory Visit: Payer: Self-pay

## 2019-05-24 ENCOUNTER — Ambulatory Visit (INDEPENDENT_AMBULATORY_CARE_PROVIDER_SITE_OTHER): Payer: Medicare Other | Admitting: Family Medicine

## 2019-05-24 ENCOUNTER — Encounter: Payer: Self-pay | Admitting: Family Medicine

## 2019-05-24 VITALS — BP 160/88 | HR 100 | Temp 98.6°F | Resp 12 | Ht 61.0 in | Wt 186.0 lb

## 2019-05-24 DIAGNOSIS — W540XXA Bitten by dog, initial encounter: Secondary | ICD-10-CM

## 2019-05-24 DIAGNOSIS — Z972 Presence of dental prosthetic device (complete) (partial): Secondary | ICD-10-CM

## 2019-05-24 DIAGNOSIS — Z9989 Dependence on other enabling machines and devices: Secondary | ICD-10-CM

## 2019-05-24 DIAGNOSIS — Z7409 Other reduced mobility: Secondary | ICD-10-CM | POA: Diagnosis not present

## 2019-05-24 DIAGNOSIS — Z0000 Encounter for general adult medical examination without abnormal findings: Secondary | ICD-10-CM | POA: Diagnosis not present

## 2019-05-24 DIAGNOSIS — I10 Essential (primary) hypertension: Secondary | ICD-10-CM

## 2019-05-24 DIAGNOSIS — R29898 Other symptoms and signs involving the musculoskeletal system: Secondary | ICD-10-CM | POA: Diagnosis not present

## 2019-05-24 DIAGNOSIS — L602 Onychogryphosis: Secondary | ICD-10-CM

## 2019-05-24 DIAGNOSIS — E1159 Type 2 diabetes mellitus with other circulatory complications: Secondary | ICD-10-CM | POA: Diagnosis not present

## 2019-05-24 MED ORDER — AMOXICILLIN-POT CLAVULANATE 875-125 MG PO TABS: 1.0000 | ORAL_TABLET | Freq: Two times a day (BID) | ORAL | 0 refills | Status: AC

## 2019-05-24 NOTE — Progress Notes (Signed)
Health Maintenance reviewed -   Immunization History  Administered Date(s) Administered  . H1N1 11/07/2008  . Influenza Split 07/12/2012  . Influenza Whole 07/03/2007, 07/13/2007, 07/24/2008, 09/26/2009, 07/17/2010  . Influenza,inj,Quad PF,6+ Mos 08/28/2013, 09/10/2014, 09/03/2015, 08/31/2016, 07/26/2018  . Pneumococcal Polysaccharide-23 08/25/2006, 11/12/2012  . Td 09/08/2005  . Tdap 03/15/2012   Last Pap smear: Due 2020 Last mammogram: Due 2020 Last colonoscopy: Due 2030  Last DEXA: no at age yet Dentist:  Needs to, has dentures (needs referral) Ophtho: Due 2020 Exercise: Uses walker    Other doctors caring for patient include:  Patient Care Team: [name], MD as PCP - General Herminio Commons, MD as PCP - Cardiology (Cardiology) Madelin Headings, DO (Optometry) Garwin Brothers, MD as Physician Assistant (Internal Medicine) [name], MD as Consulting Physician (Orthopedic Surgery) Neldon [name], RN as [place], Kelly F, LCSW as [place] [facility] (Licensed Clinical Social Worker)  End of Life Discussion:  Patient does not have a living will and medical power of attorney    Code Status: Prior   Subjective:   HPI  [name] is a 53 y.o. female who presents for annual wellness visit and follow-up on chronic medical conditions.  She has the following concerns: dog bite from her dog, and thumb nail falling off.  Finger (bite from dog, Daniel) Left index finger, bite was about 2 weeks ago. Mild pain with bending or stretching out. Bleed a lot. Treated with Bactroban. Now has a white blister like lesion with dark spot in the center. Denies warmth. Denies pus prior to blister. Mild swelling and "tight feeling".  Right thumb nail is falling off after she burned herself. Unsure of when she did it, it has been several weeks.  She was trying to take a pan off the stove. Has poor hand  grip in right hand.  Additionally is in need of a rolling walker with seat due to progress decline in mobility.  Reports taking medications as directed and without issue. Was recently seen by Dr Moshe Cipro in June. Has history of non adherence to medications.   Today patient denies signs and symptoms of COVID 19 infection including fever, chills, cough, shortness of breath, and headache.  Past Medical, Surgical, Social History, Allergies, and Medications have been Reviewed.  Review Of Systems  Review of Systems  Constitutional: Negative for activity change, appetite change, chills and fever.  HENT: Negative.   Eyes: Negative.   Respiratory: Negative.   Cardiovascular: Negative.   Gastrointestinal: Negative.   Endocrine: Negative.   Genitourinary: Negative.   Musculoskeletal: Positive for gait problem and myalgias.  Skin: Positive for wound.  Allergic/Immunologic: Negative.   Hematological: Negative.   Psychiatric/Behavioral: Negative.   All other systems reviewed and are negative.   Objective:   PHYSICAL EXAM:  BP (!) 178/119   Pulse 100   Temp 98.6 F (37 C) (Temporal)   Resp 12   Ht 5\' 1"  (1.549 m)   Wt 186 lb 0.6 oz (84.4 kg)   LMP 05/19/2016   SpO2 99%   BMI 35.15 kg/m   Physical Exam Vitals signs and nursing note reviewed.  Constitutional:      Appearance: Normal appearance. She is normal weight.  HENT:     Head: Normocephalic and atraumatic.     Right Ear: Tympanic membrane, ear canal and external ear normal.     Left Ear: Tympanic membrane, ear canal and external ear normal.  Nose: Nose normal. No congestion or rhinorrhea.     Mouth/Throat:     Mouth: Mucous membranes are moist.     Dentition: Has dentures.     Pharynx: Oropharynx is clear. No posterior oropharyngeal erythema.  Eyes:     General: No scleral icterus.       Right eye: No discharge.        Left eye: No discharge.     Extraocular Movements: Extraocular movements intact.      Conjunctiva/sclera: Conjunctivae normal.     Pupils: Pupils are equal, round, and reactive to light.  Neck:     Musculoskeletal: Normal range of motion and neck supple.  Cardiovascular:     Rate and Rhythm: Normal rate and regular rhythm.     Pulses: Normal pulses.          Radial pulses are 2+ on the right side and 2+ on the left side.       Dorsalis pedis pulses are 2+ on the right side and 2+ on the left side.     Heart sounds: Normal heart sounds.  Pulmonary:     Effort: Pulmonary effort is normal.     Breath sounds: Normal breath sounds.  Abdominal:     General: Abdomen is flat. Bowel sounds are normal.     Palpations: Abdomen is soft.  Musculoskeletal: Normal range of motion.     Right lower leg: No edema.     Left lower leg: No edema.     Right foot: Deformity present.     Left foot: Deformity present.  Feet:     Right foot:     Skin integrity: Skin integrity normal.     Toenail Condition: Right toenails are abnormally thick and long.     Left foot:     Skin integrity: Skin integrity normal.     Toenail Condition: Left toenails are abnormally thick and long.     Comments: Healed surgical scars on both feet  Skin:    General: Skin is warm and dry.     Capillary Refill: Capillary refill takes less than 2 seconds.     Comments: multiple previously healed wounds on legs. Noted bite mark and purlent blister to left index finger Noted right thumb nail partial off from burn  Noted small bite mark, raised bump on lateral aspect of lower right leg.   Neurological:     General: No focal deficit present.     Mental Status: She is alert and oriented to person, place, and time. Mental status is at baseline.     Cranial Nerves: Cranial nerves are intact.     Sensory: Sensory deficit present.     Motor: Weakness present.     Coordination: Coordination abnormal. Heel to Shin Test abnormal.     Gait: Gait abnormal.  Psychiatric:        Attention and Perception: Attention normal.         Mood and Affect: Mood and affect normal.        Speech: Speech normal.        Behavior: Behavior normal. Behavior is cooperative.        Thought Content: Thought content normal.        Cognition and Memory: Cognition and memory normal.        Judgment: Judgment normal.    Depression Screening  Depression screen [facility] 2/9 05/24/2019 05/02/2019 03/27/2019 02/21/2019 09/05/2018  Decreased Interest 0 0 0 0 1  Down, Depressed, Hopeless 3  1 0 3 1  PHQ - 2 Score 3 1 0 3 2  Altered sleeping 0 - 0 3 0  Tired, decreased energy 0 - 0 3 2  Change in appetite 0 - 0 3 0  Feeling bad or failure about yourself  2 - 0 3 0  Trouble concentrating 0 - 0 3 0  Moving slowly or fidgety/restless 0 - 0 0 0  Suicidal thoughts 0 - 0 0 0  PHQ-9 Score 5 - 0 18 4  Difficult doing work/chores - - - Somewhat difficult Not difficult at all  Some recent data might be hidden   Procedure: I&D of left index finger and Cut nail of right thumb  Site were cleaned with alcohol. Sterile scalp was used to make a 3 mm incision in the index finger at the site of infection. Purulent drainage immediate evacuated the site. Once the drainage stopped, wound was cleaned with wound cleaner and dressing of dry gauze and tape was applied.  Site was cleaned with alcohol. Cleaned nail clippers were used to trim Right thumb nail back to prevent it from ripping off due to it partial being burned off. A dressing of dry gauze and tape was applied to protect the site from further injury.   Assessment & Plan:   1. Annual physical exam Discussed monthly self breast exams and yearly mammograms; at least 30 minutes of aerobic activity at least 5 days/week and weight-bearing exercise 2x/week; proper sunscreen use reviewed; healthy diet, including goals of calcium and vitamin D intake and alcohol recommendations (less than or equal to 1 drink/day) reviewed; regular seatbelt use; changing batteries in smoke detectors.  Immunization recommendations  discussed.  Colonoscopy recommendations reviewed.  2. Weakness of both lower extremities Worsening leg weakness and foot deformity make walking difficult. Needs a all wheel rolling walker with seat. Also needs home health to eval and tx as appropriate.  - For home use only DME 4 wheeled rolling walker with seat ([id number]) - Ambulatory referral to [place]  3. Poor mobility Ongoing trouble with walking. Trouble lifting and rolling current walker as it is a two wheel. She also gets tired after 100 feet and needs to sit. A wheeled walker with seat is recommended and ordered today  - For home use only DME 4 wheeled rolling walker with seat ([id number]) - Ambulatory referral to [place]  4. Dog bite, initial encounter Swollen, pus filled bite mark on Left index finger. Incision and drainage in the office today. See note above. Needs coverage for infection. Augmentin given for 7 days  As she has hx of septic infection with previous dog bite.  -incision and drainage in office - amoxicillin-clavulanate (AUGMENTIN) 875-125 MG tablet; Take 1 tablet by mouth 2 (two) times daily for 7 days.  Dispense: 14 tablet; Refill: 0  5. Type 2 diabetes mellitus with vascular disease ([place]) MARIANITA BOTKIN is encouraged to continue current medications. Is on statin as well. Educated on importance of maintain a well balanced diabetic friendly diet. Written materials provided today. She is reminded the importance of maintaining  good blood sugars,  taking medications as directed, daily foot care, annual eye exams. Additionally educated about keeping good control over blood pressure and cholesterol as well. Referral for nutrition ordered today.  - Amb Referral to Nutrition and Diabetic E  6. Thickened nail Needs help cutting her toe mails. They are thick and she cant reach or grasp well with right hand.  - Ambulatory referral to  Podiatry  7. Wears dentures Needs to be seen as she reports trouble  with dentures.  - Ambulatory referral to Dentistry  8. Uses walker Poor mobility. On going new walker ordered and home health   9. Weakness of right upper extremity In ability to close right hand due to previous injury. Has a hard time holding things.  - Ambulatory referral to Home Health  10. HTN (hypertension), malignant Uncontrolled due to non compliance , importance of same is again stressed DASH diet and commitment to daily physical activity for a minimum of 30 minutes discussed and encouraged (though she has a difficulty time walking. The importance of attaining a healthy weight is also discussed.  Medicare Attestation I have personally reviewed: The patient's medical and social history Their use of alcohol, tobacco or illicit drugs Their current medications and supplements The patient's functional ability including ADLs,fall risks, home safety risks, cognitive, and hearing and visual impairment Diet and physical activities Evidence for depression or mood disorders  The patient's weight, height, BMI, and visual acuity have been recorded in the chart.  I have made referrals, counseling, and provided education to the patient based on review of the above and I have provided the patient with a written personalized care plan for preventive services.     Perlie Mayo, NP   05/24/2019

## 2019-05-24 NOTE — Patient Instructions (Addendum)
Thank you for coming into the office today. I appreciate the opportunity to provide you with the care for your health and wellness. Today we discussed:  Overall health  Follow Up: Next Tuesday for dog bite follow up and Nov for DM, foot exam follow up  No labs today.   Referrals for dentis, nutrition and podiatry made today.  I have sent in medication for the infection of your finger. Please take the full course of this and come back Tuesday for me to re-exam and make sure it is not worsening.   Please continue to work on diet for diabetes. I have provided information about this for you to read.  I will order your rolling walker with seat and home health RN as well.  Please continue to practice social distancing to keep you, your family, and our community safe.  If you must go out, please wear a Mask and practice good handwashing.  [place] YOUR HANDS WELL AND FREQUENTLY. AVOID TOUCHING YOUR FACE, UNLESS YOUR HANDS ARE FRESHLY WASHED.  GET FRESH AIR DAILY. STAY HYDRATED WITH WATER.   It was a pleasure to see you and I look forward to continuing to work together on your health and well-being. Please do not hesitate to call the office if you need care or have questions about your care.  Have a wonderful day and week. With [place]at[place], Cherly Beach, DNP, AGNP-BC   Diabetes Mellitus and Nutrition, Adult When you have diabetes (diabetes mellitus), it is very important to have healthy eating habits because your blood sugar (glucose) levels are greatly affected by what you eat and drink. Eating healthy foods in the appropriate amounts, at about the same times every day, can help you:  Control your blood glucose.  Lower your risk of heart disease.  Improve your blood pressure.  Reach or maintain a healthy weight. Every person with diabetes is different, and each person has different needs for a meal plan. Your health care provider may recommend that you work with a diet and  nutrition specialist (dietitian) to make a meal plan that is best for you. Your meal plan may vary depending on factors such as:  The calories you need.  The medicines you take.  Your weight.  Your blood glucose, blood pressure, and cholesterol levels.  Your activity level.  Other health conditions you have, such as heart or kidney disease. How do carbohydrates affect me? Carbohydrates, also called carbs, affect your blood glucose level more than any other type of food. Eating carbs naturally raises the amount of glucose in your blood. Carb counting is a method for keeping track of how many carbs you eat. Counting carbs is important to keep your blood glucose at a healthy level, especially if you use insulin or take certain oral diabetes medicines. It is important to know how many carbs you can safely have in each meal. This is different for every person. Your dietitian can help you calculate how many carbs you should have at each meal and for each snack. Foods that contain carbs include:  Bread, cereal, rice, pasta, and crackers.  Potatoes and corn.  Peas, beans, and lentils.  Milk and yogurt.  Fruit and juice.  Desserts, such as cakes, cookies, ice cream, and candy. How does alcohol affect me? Alcohol can cause a sudden decrease in blood glucose (hypoglycemia), especially if you use insulin or take certain oral diabetes medicines. Hypoglycemia can be a life-threatening condition. Symptoms of hypoglycemia (sleepiness, dizziness, and confusion) are  similar to symptoms of having too much alcohol. If your health care provider says that alcohol is safe for you, follow these guidelines:  Limit alcohol intake to no more than 1 drink per day for nonpregnant women and 2 drinks per day for men. One drink equals 12 oz of beer, 5 oz of wine, or 1 oz of hard liquor.  Do not drink on an empty stomach.  Keep yourself hydrated with water, diet soda, or unsweetened iced tea.  Keep in mind  that regular soda, juice, and other mixers may contain a lot of sugar and must be counted as carbs. What are tips for following this plan?  Reading food labels  Start by checking the serving size on the "Nutrition Facts" label of packaged foods and drinks. The amount of calories, carbs, fats, and other nutrients listed on the label is based on one serving of the item. Many items contain more than one serving per package.  Check the total grams (g) of carbs in one serving. You can calculate the number of servings of carbs in one serving by dividing the total carbs by 15. For example, if a food has 30 g of total carbs, it would be equal to 2 servings of carbs.  Check the number of grams (g) of saturated and trans fats in one serving. Choose foods that have low or no amount of these fats.  Check the number of milligrams (mg) of salt (sodium) in one serving. Most people should limit total sodium intake to less than 2,300 mg per day.  Always check the nutrition information of foods labeled as "low-fat" or "nonfat". These foods may be higher in added sugar or refined carbs and should be avoided.  Talk to your dietitian to identify your daily goals for nutrients listed on the label. Shopping  Avoid buying canned, premade, or processed foods. These foods tend to be high in fat, sodium, and added sugar.  Shop around the outside edge of the grocery store. This includes fresh fruits and vegetables, bulk grains, fresh meats, and fresh dairy. Cooking  Use low-heat cooking methods, such as baking, instead of high-heat cooking methods like deep frying.  Cook using healthy oils, such as olive, canola, or sunflower oil.  Avoid cooking with butter, cream, or high-fat meats. Meal planning  Eat meals and snacks regularly, preferably at the same times every day. Avoid going long periods of time without eating.  Eat foods high in fiber, such as fresh fruits, vegetables, beans, and whole grains. Talk to  your dietitian about how many servings of carbs you can eat at each meal.  Eat 4-6 ounces (oz) of lean protein each day, such as lean meat, chicken, fish, eggs, or tofu. One oz of lean protein is equal to: ? 1 oz of meat, chicken, or fish. ? 1 egg. ?  cup of tofu.  Eat some foods each day that contain healthy fats, such as avocado, nuts, seeds, and fish. Lifestyle  Check your blood glucose regularly.  Exercise regularly as told by your health care provider. This may include: ? 150 minutes of moderate-intensity or vigorous-intensity exercise each week. This could be brisk walking, biking, or water aerobics. ? Stretching and doing strength exercises, such as yoga or weightlifting, at least 2 times a week.  Take medicines as told by your health care provider.  Do not use any products that contain nicotine or tobacco, such as cigarettes and e-cigarettes. If you need help quitting, ask your health care provider.  Work with a Social worker or diabetes educator to identify strategies to manage stress and any emotional and social challenges. Questions to ask a health care provider  Do I need to meet with a diabetes educator?  Do I need to meet with a dietitian?  What number can I call if I have questions?  When are the best times to check my blood glucose? Where to find more information:  American Diabetes Association: diabetes.org  Academy of Nutrition and Dietetics: www.eatright.[employer] of Diabetes and Digestive and Kidney Diseases (NIH): [employer] Summary  A healthy meal plan will help you control your blood glucose and maintain a healthy lifestyle.  Working with a diet and nutrition specialist (dietitian) can help you make a meal plan that is best for you.  Keep in mind that carbohydrates (carbs) and alcohol have immediate effects on your blood glucose levels. It is important to count carbs and to use alcohol carefully. This information is not intended to  replace advice given to you by your health care provider. Make sure you discuss any questions you have with your health care provider. Document Released: 07/09/2005 Document Revised: 09/24/2017 Document Reviewed: 11/16/2016 Elsevier Patient Education  2020 [employer].

## 2019-05-25 ENCOUNTER — Other Ambulatory Visit: Payer: Self-pay

## 2019-05-25 NOTE — Patient Outreach (Signed)
[facility] Care Management  05/25/2019  SITLALI KOERNER 25-Sep-1966 [id number]    Current Outpatient Medications on File Prior to Visit  Medication Sig Dispense Refill  . albuterol (PROAIR HFA) 108 (90 Base) MCG/ACT inhaler INHALE 2 PUFFS EVERY 6 HOURS AS NEEDED FOR SHORTNESS OF BREATH/WHEEZING. 8.5 g 0  . amLODipine (NORVASC) 10 MG tablet TAKE 1 TABLET BY MOUTH ONCE A DAY. 30 tablet 5  . amoxicillin-clavulanate (AUGMENTIN) 875-125 MG tablet Take 1 tablet by mouth 2 (two) times daily for 7 days. 14 tablet 0  . aspirin EC 81 MG tablet Take 1 tablet (81 mg total) by mouth daily. 150 tablet 2  . cloNIDine (CATAPRES) 0.3 MG tablet TAKE 1 TABLET BY MOUTH AT BEDTIME FOR BLOOD PRESSURE 30 tablet 5  . clopidogrel (PLAVIX) 75 MG tablet TAKE 1 TABLET BY MOUTH DAILY WITH BREAKFAST. 30 tablet 5  . DULoxetine (CYMBALTA) 30 MG capsule Take 60 mg by mouth daily.     Marland Kitchen ezetimibe (ZETIA) 10 MG tablet TAKE 1 TABLET BY MOUTH ONCE A DAY. 30 tablet 5  . furosemide (LASIX) 20 MG tablet TAKE 1 TABLET BY MOUTH ONCE A DAY. 30 tablet 0  . gabapentin (NEURONTIN) 300 MG capsule TAKE 1 CAPSULE BY MOUTH THREE TIMES A DAY. 90 capsule 0  . hydrOXYzine (ATARAX/VISTARIL) 25 MG tablet TAKE 1 TABLET BY MOUTH AT BEDTIME FOR ITCHING AND SLEEP. 30 tablet 0  . ipratropium-albuterol (DUONEB) 0.5-2.5 (3) MG/3ML SOLN Take 3 mLs by nebulization every 6 (six) hours as needed (wheezes or SOB). 360 mL 1  . metoprolol succinate (TOPROL-XL) 50 MG 24 hr tablet TAKE 1 TABLET BY MOUTH TWICE A DAY. 60 tablet 0  . montelukast (SINGULAIR) 10 MG tablet TAKE (1) TABLET BY MOUTH AT BEDTIME. 30 tablet 5  . olmesartan (BENICAR) 40 MG tablet TAKE 1 TABLET BY MOUTH ONCE A DAY. 30 tablet 5  . pantoprazole (PROTONIX) 40 MG tablet TAKE 1 TABLET BY MOUTH ONCE A DAY. 30 tablet 5  . potassium chloride SA (K-DUR) 20 MEQ tablet TAKE 1&1/2 TABLET BY MOUTH DAILY. 45 tablet 5  . rosuvastatin (CRESTOR) 10 MG tablet TAKE 1 TABLET BY MOUTH ONCE  A DAY. (Patient taking differently: Take 10 mg by mouth every evening. ) 30 tablet 6  . solifenacin (VESICARE) 5 MG tablet Take 1 tablet (5 mg total) by mouth daily. 30 tablet 5  . tiotropium (SPIRIVA HANDIHALER) 18 MCG inhalation capsule Place 18 mcg into inhaler and inhale daily as needed (SHORTNESS OF BREATH).     . TRADJENTA 5 MG TABS tablet TAKE 1 TABLET BY MOUTH ONCE DAILY. 30 tablet 5  . traMADol (ULTRAM) 50 MG tablet Take by mouth every 8 (eight) hours as needed.    . traZODone (DESYREL) 50 MG tablet TAKE (1) TABLET BY MOUTH AT BEDTIME. 30 tablet 5  . [DISCONTINUED] amiodarone (PACERONE) 200 MG tablet Take 1 tablet (200 mg total) by mouth 2 (two) times daily. 60 tablet 2   No current facility-administered medications on file prior to visit.     Brief outreach with Ms. Liera. She is pending telephonic outreach with [facility] [place] Coord[facility]. She is agreeable to completing a telephonic assessment on next week.  She denies urgent needs but remains eager to obtain in-home services. Encouraged to contact if needed prior to the scheduled outreach.  PLAN -Will f/u for update from [place]. -Will f/u with Ms. Manger on next week.   [place] (  [phone]  

## 2019-05-29 DIAGNOSIS — M6281 Muscle weakness (generalized): Secondary | ICD-10-CM | POA: Diagnosis not present

## 2019-05-29 DIAGNOSIS — R269 Unspecified abnormalities of gait and mobility: Secondary | ICD-10-CM | POA: Diagnosis not present

## 2019-05-29 DIAGNOSIS — J441 Chronic obstructive pulmonary disease with (acute) exacerbation: Secondary | ICD-10-CM | POA: Diagnosis not present

## 2019-05-30 ENCOUNTER — Ambulatory Visit: Payer: Medicare Other | Admitting: Family Medicine

## 2019-06-01 ENCOUNTER — Other Ambulatory Visit: Payer: Self-pay

## 2019-06-01 ENCOUNTER — Telehealth: Payer: Self-pay | Admitting: *Deleted

## 2019-06-01 DIAGNOSIS — I130 Hypertensive heart and chronic kidney disease with heart failure and stage 1 through stage 4 chronic kidney disease, or unspecified chronic kidney disease: Secondary | ICD-10-CM | POA: Diagnosis not present

## 2019-06-01 DIAGNOSIS — D649 Anemia, unspecified: Secondary | ICD-10-CM | POA: Diagnosis not present

## 2019-06-01 DIAGNOSIS — E785 Hyperlipidemia, unspecified: Secondary | ICD-10-CM | POA: Diagnosis not present

## 2019-06-01 DIAGNOSIS — E559 Vitamin D deficiency, unspecified: Secondary | ICD-10-CM | POA: Diagnosis not present

## 2019-06-01 DIAGNOSIS — J449 Chronic obstructive pulmonary disease, unspecified: Secondary | ICD-10-CM | POA: Diagnosis not present

## 2019-06-01 DIAGNOSIS — Z7902 Long term (current) use of antithrombotics/antiplatelets: Secondary | ICD-10-CM | POA: Diagnosis not present

## 2019-06-01 DIAGNOSIS — Z9119 Patient's noncompliance with other medical treatment and regimen: Secondary | ICD-10-CM | POA: Diagnosis not present

## 2019-06-01 DIAGNOSIS — E1151 Type 2 diabetes mellitus with diabetic peripheral angiopathy without gangrene: Secondary | ICD-10-CM | POA: Diagnosis not present

## 2019-06-01 DIAGNOSIS — E1122 Type 2 diabetes mellitus with diabetic chronic kidney disease: Secondary | ICD-10-CM | POA: Diagnosis not present

## 2019-06-01 DIAGNOSIS — I4891 Unspecified atrial fibrillation: Secondary | ICD-10-CM | POA: Diagnosis not present

## 2019-06-01 DIAGNOSIS — G894 Chronic pain syndrome: Secondary | ICD-10-CM | POA: Diagnosis not present

## 2019-06-01 DIAGNOSIS — N183 Chronic kidney disease, stage 3 (moderate): Secondary | ICD-10-CM | POA: Diagnosis not present

## 2019-06-01 DIAGNOSIS — I5042 Chronic combined systolic (congestive) and diastolic (congestive) heart failure: Secondary | ICD-10-CM | POA: Diagnosis not present

## 2019-06-01 DIAGNOSIS — Z7409 Other reduced mobility: Secondary | ICD-10-CM | POA: Diagnosis not present

## 2019-06-01 DIAGNOSIS — E1140 Type 2 diabetes mellitus with diabetic neuropathy, unspecified: Secondary | ICD-10-CM | POA: Diagnosis not present

## 2019-06-01 NOTE — Patient Outreach (Signed)
[facility]) Care Management  [place]  06/01/2019   [name] [birth year] [id number]  Subjective:  Outreach for completion of telephonic assessment.  Objective:  Encounter Medications:  Outpatient Encounter Medications as of 06/01/2019  Medication Sig  . albuterol (PROAIR HFA) 108 (90 Base) MCG/ACT inhaler INHALE 2 PUFFS EVERY 6 HOURS AS NEEDED FOR SHORTNESS OF BREATH/WHEEZING.  Marland Kitchen amLODipine (NORVASC) 10 MG tablet TAKE 1 TABLET BY MOUTH ONCE A DAY.  Marland Kitchen aspirin EC 81 MG tablet Take 1 tablet (81 mg total) by mouth daily.  . cloNIDine (CATAPRES) 0.3 MG tablet TAKE 1 TABLET BY MOUTH AT BEDTIME FOR BLOOD PRESSURE  . clopidogrel (PLAVIX) 75 MG tablet TAKE 1 TABLET BY MOUTH DAILY WITH BREAKFAST.  . DULoxetine (CYMBALTA) 30 MG capsule Take 60 mg by mouth daily.   Marland Kitchen ezetimibe (ZETIA) 10 MG tablet TAKE 1 TABLET BY MOUTH ONCE A DAY.  . furosemide (LASIX) 20 MG tablet TAKE 1 TABLET BY MOUTH ONCE A DAY.  Marland Kitchen gabapentin (NEURONTIN) 300 MG capsule TAKE 1 CAPSULE BY MOUTH THREE TIMES A DAY.  . hydrOXYzine (ATARAX/VISTARIL) 25 MG tablet TAKE 1 TABLET BY MOUTH AT BEDTIME FOR ITCHING AND SLEEP.  Marland Kitchen ipratropium-albuterol (DUONEB) 0.5-2.5 (3) MG/3ML SOLN Take 3 mLs by nebulization every 6 (six) hours as needed (wheezes or SOB).  . metoprolol succinate (TOPROL-XL) 50 MG 24 hr tablet TAKE 1 TABLET BY MOUTH TWICE A DAY.  . montelukast (SINGULAIR) 10 MG tablet TAKE (1) TABLET BY MOUTH AT BEDTIME.  Marland Kitchen olmesartan (BENICAR) 40 MG tablet TAKE 1 TABLET BY MOUTH ONCE A DAY.  . pantoprazole (PROTONIX) 40 MG tablet TAKE 1 TABLET BY MOUTH ONCE A DAY.  Marland Kitchen potassium chloride SA (K-DUR) 20 MEQ tablet TAKE 1&1/2 TABLET BY MOUTH DAILY.  . rosuvastatin (CRESTOR) 10 MG tablet TAKE 1 TABLET BY MOUTH ONCE A DAY. (Patient taking differently: Take 10 mg by mouth every evening. )  . solifenacin (VESICARE) 5 MG tablet Take 1 tablet (5 mg total) by mouth daily.  Marland Kitchen tiotropium (SPIRIVA HANDIHALER) 18 MCG  inhalation capsule Place 18 mcg into inhaler and inhale daily as needed (SHORTNESS OF BREATH).   . TRADJENTA 5 MG TABS tablet TAKE 1 TABLET BY MOUTH ONCE DAILY.  . traMADol (ULTRAM) 50 MG tablet Take by mouth every 8 (eight) hours as needed.  . traZODone (DESYREL) 50 MG tablet TAKE (1) TABLET BY MOUTH AT BEDTIME.  . [DISCONTINUED] amiodarone (PACERONE) 200 MG tablet Take 1 tablet (200 mg total) by mouth 2 (two) times daily.   No facility-administered encounter medications on file as of 06/01/2019.     Functional Status:  In your present state of health, do you have any difficulty performing the following activities: 06/01/2019 05/09/2019  Hearing? N -  Vision? N -  Difficulty concentrating or making decisions? N -  Walking or climbing stairs? Y -  Dressing or bathing? Y -  Comment Reports occassionally requiring assistance. -  Doing errands, shopping? Y N  Preparing Food and eating ? Y -  Comment Preparing food. -  Using the Toilet? Y -  In the past six months, have you accidently leaked urine? Y -  Do you have problems with loss of bowel control? Y -  Managing your Medications? N -  Managing your Finances? N -  Housekeeping or managing your Housekeeping? Y -  Some recent data might be hidden    Fall/Depression Screening: Fall Risk  06/01/2019 05/24/2019 03/27/2019  Falls in the past year?  1 1 1   Number falls in past yr: 1 1 0  Injury with Fall? 0 0 1  Risk for fall due to : Impaired balance/gait;Medication side effect - -  Follow up Falls prevention discussed - -   PHQ 2/9 Scores 06/01/2019 05/24/2019 05/02/2019 03/27/2019 02/21/2019 09/05/2018 07/26/2018  PHQ - 2 Score 1 3 1  0 3 2 6   PHQ- 9 Score - 5 - 0 18 4 21     Assessment:  Successful outreach with [name]. Telephonic assessment complete. She confirmed start of skilled nursing services with [facility]. Reports feeling well today.  Medications reviewed. [name] reports taking medications as prescribed. She reports receiving  mail ordered compliance packages via [name]. Denies concerns regarding prescription costs.  She reports monitoring her weight and blood pressure routinely. Strongly encouraged to monitor BP daily and maintain a log. Per chart review, her readings have been elevated over the past few months. She denies worsening symptoms associated with elevated BP and verbalizes knowledge of indications for seeking immediate medical attention. She reports not monitoring her blood glucose levels due to not having a glucometer.   Reports the following readings on 05/31/19: Weight-186lbs     BP-160/80 (obtained by [facility] nurse)  Discussed care management needs. [name] lives alone. She reports that obtaining assistance in the home is her primary concern. She is aware of the pending outreach with [name], [name]. Her mobility is limited, but she denies recent falls. She does express concerns regarding nutrition and is agreeable to a referral for free meal deliveries.  She is aware that the program is temporary and will follow-up with [name] if a long-term solution is needed. She declines current need for transportation assistance. [facility] CM Care Plan Problem One     Most Recent Value  Care Plan Problem One  Risk for Admission  Role Documenting the Problem One  Care Management [place] for Problem One  Active  THN Long Term Goal   Patient will not be hospitalized over the next 90 days.  THN Long Term Goal Start Date  06/01/19  Interventions for Problem One Long Term Goal  Reviewed medications, nutrition, BP ranges and compliance with treatment recommendations. Encouraged to obtain glucometer and monitor FBS levels. Discussed safety and fall prevention.  [facility] CM Short Term Goal #1   Over the next 30 days, patient will take all medications as prescribed.  [facility] CM Short Term Goal #1 Start Date  06/01/19  Interventions for Short Term Goal #1  Reviewed medications and discussed indications for use.  Patient encouraged to take as prescribed and notify MD with concerns regarding medication tolerance.  [Denied concerns regarding prescription cost.]  [facility] CM Short Term Goal #2   Over the next 30 days, patient will attend MD appointments as scheduled.  [facility] CM Short Term Goal #2 Start Date  06/01/19  Interventions for Short Term Goal #2  Reviewed pending appointments. Patient encouraged to attend all appointments as scheduled to prevent care/treatment delays. Discussed options for transportation. [Declined need for transportation assistance.]  [facility] CM Short Term Goal #3  Over the next 30 days, patient will monitor BP daily and maintain a log.  [facility] CM Short Term Goal #3 Start Date  06/01/19  Interventions for Short Tern Goal #3  Discussed importance of monitoring BP daily and maintaining a log to identify trends. Provided education regarding s/sx that require immediate medical attention.  [Reports reading of 160/80 on 05/31/19. Obtained by [facility]  nurse.]  [facility] CM Short Term Goal #4  Over the next 30 days, patient will obtain glucometer and monitor blood glucose levels daily.  [facility]N CM Short Term Goal #4 Start Date  06/01/19  Interventions for Short Term Goal #4  Reviewed s/sx of hypoglycemia and hyperglycemia along with recommended interventions. Patient reports not monitoring blood glucose levels due to not having a glucometer. Encouraged to monitor daily and maintain log once device is obtained. [Pending order for glucometer.]    [facility] CM Care Plan Problem Two     Most Recent Value  Care Plan Problem Two  High Risk for Falls  Role Documenting the Problem Two  Care Management Coordinator  Care Plan for Problem Two  Active  Interventions for Problem Two Long Term Goal   Provided education regarding home safety and fall prevention measures. [THN [name] to assist with obtaining in-home assistance.]  THN Long Term Goal  Patient will not experience falls over the next 60 days.  THN Long Term Goal Start Date  06/01/19   THN CM Short Term Goal #1   Over the next 30 days, patient will use assistive device when attempting to ambulate in the home.  [facility]N CM Short Term Goal #1 Start Date  06/01/19  Interventions for Short Term Goal #2   Provided education regarding fall prevention. Patient recently obtained a rollator walker. Encouraged to use the device when ambulating in the home.   [facility] CM Short Term Goal #2   Over the next 30 days, patient will practice home safety measures to prevent injuries.   [facility] CM Short Term Goal #2 Start Date  06/01/19  Interventions for Short Term Goal #2  Patient encouraged to ensure pathways are clear prior to attempting to ambulate. Discussed potential trip hazards(including her small dog) and measures to prevent falls.       PLAN -Will update THN [name]. -Will submit referral for meal delivery program. -Will contact MD regarding glucometer. -Will follow-up with Ms. Borneman within two weeks.    [place] [phone]

## 2019-06-01 NOTE — Telephone Encounter (Signed)
Spoke with Jenny and gave verbal orders 

## 2019-06-01 NOTE — Telephone Encounter (Signed)
[name] with [facility] called needing start of care orders for [name]. She can be reached at [phone]

## 2019-06-02 ENCOUNTER — Other Ambulatory Visit: Payer: Self-pay | Admitting: *Deleted

## 2019-06-02 NOTE — Patient Outreach (Signed)
[facility]) Care Management  06/02/2019  [name] [birth year] [id number]   CSW called & spoke with patient about housing & food concerns. Patient reports that her light bill was too high that she couldn't pay her rent. CSW provided patient with the phone number to [employer] (ph#: [phone]) and [employer] (ph#: [phone]) to see if they were aware of any additional resources in [place] asked patient about PACE, patient states that she has spoken with someone from [facility] and they are waiting for a nurse to come out to do her assessment but is anticipating that she'll be able to come out next week. CSW also asked about meals on wheels, patient agreed to be put on their waiting list. CSW left voicemail for Bonnetta Barry at [facility], [place]. CSW also submitted referral for [facility] Mobile [employer]als through [place] for temporary 14 meals a week/3 months as patient qualifies due to diagnosis of CHF. CSW will also mail to patient a list of food pantries. Patient states that he sister would be able to take her when needed.   CSW will follow-up with patient in 2 weeks to ensure start of mobile meals and see how PACE assessment went.    Raynaldo Opitz, LCSW [facility]  Clinical Social Worker cell #: [phone]

## 2019-06-05 ENCOUNTER — Telehealth: Payer: Self-pay | Admitting: *Deleted

## 2019-06-05 NOTE — Telephone Encounter (Signed)
Spoke with patient and gave her the number to [employer] to call and see if they could help her with what she is needing help with.

## 2019-06-05 NOTE — Telephone Encounter (Signed)
Pt called saying [facility] was sent to her home by Dr. Moshe Cipro however [place] told her she was eligible for someone to come out and help her with the cleaning of her home. She was wondering why no one had contacted her to set this up.

## 2019-06-06 ENCOUNTER — Telehealth: Payer: Self-pay | Admitting: *Deleted

## 2019-06-06 ENCOUNTER — Telehealth: Payer: Self-pay

## 2019-06-06 DIAGNOSIS — E1122 Type 2 diabetes mellitus with diabetic chronic kidney disease: Secondary | ICD-10-CM | POA: Diagnosis not present

## 2019-06-06 DIAGNOSIS — I5042 Chronic combined systolic (congestive) and diastolic (congestive) heart failure: Secondary | ICD-10-CM | POA: Diagnosis not present

## 2019-06-06 DIAGNOSIS — Z9119 Patient's noncompliance with other medical treatment and regimen: Secondary | ICD-10-CM | POA: Diagnosis not present

## 2019-06-06 DIAGNOSIS — I130 Hypertensive heart and chronic kidney disease with heart failure and stage 1 through stage 4 chronic kidney disease, or unspecified chronic kidney disease: Secondary | ICD-10-CM | POA: Diagnosis not present

## 2019-06-06 DIAGNOSIS — I4891 Unspecified atrial fibrillation: Secondary | ICD-10-CM | POA: Diagnosis not present

## 2019-06-06 DIAGNOSIS — E1140 Type 2 diabetes mellitus with diabetic neuropathy, unspecified: Secondary | ICD-10-CM | POA: Diagnosis not present

## 2019-06-06 DIAGNOSIS — E1159 Type 2 diabetes mellitus with other circulatory complications: Secondary | ICD-10-CM

## 2019-06-06 DIAGNOSIS — E559 Vitamin D deficiency, unspecified: Secondary | ICD-10-CM | POA: Diagnosis not present

## 2019-06-06 DIAGNOSIS — Z7409 Other reduced mobility: Secondary | ICD-10-CM | POA: Diagnosis not present

## 2019-06-06 DIAGNOSIS — Z7902 Long term (current) use of antithrombotics/antiplatelets: Secondary | ICD-10-CM | POA: Diagnosis not present

## 2019-06-06 DIAGNOSIS — E1151 Type 2 diabetes mellitus with diabetic peripheral angiopathy without gangrene: Secondary | ICD-10-CM | POA: Diagnosis not present

## 2019-06-06 DIAGNOSIS — G894 Chronic pain syndrome: Secondary | ICD-10-CM | POA: Diagnosis not present

## 2019-06-06 DIAGNOSIS — E785 Hyperlipidemia, unspecified: Secondary | ICD-10-CM | POA: Diagnosis not present

## 2019-06-06 DIAGNOSIS — N183 Chronic kidney disease, stage 3 (moderate): Secondary | ICD-10-CM | POA: Diagnosis not present

## 2019-06-06 DIAGNOSIS — D649 Anemia, unspecified: Secondary | ICD-10-CM | POA: Diagnosis not present

## 2019-06-06 DIAGNOSIS — J449 Chronic obstructive pulmonary disease, unspecified: Secondary | ICD-10-CM | POA: Diagnosis not present

## 2019-06-06 MED ORDER — BLOOD GLUCOSE METER KIT: PACK | 0 refills | Status: DC

## 2019-06-06 NOTE — Telephone Encounter (Signed)
Glucometer and kit sent into [facility] for patient to test once daily

## 2019-06-06 NOTE — Telephone Encounter (Signed)
Spoke with Janett Billow and let her know glucometer and kit has been sent in and patient should have it by tomorrow.

## 2019-06-06 NOTE — Telephone Encounter (Signed)
Jessica with [facility] LVM to see if Kela Millin glucose meter had been sent to the pharmacy as while they were out they could teach her how to use it. Gracynn told her Dr. Georgiana Spinner been calling her one in

## 2019-06-09 DIAGNOSIS — G894 Chronic pain syndrome: Secondary | ICD-10-CM | POA: Diagnosis not present

## 2019-06-09 DIAGNOSIS — E1140 Type 2 diabetes mellitus with diabetic neuropathy, unspecified: Secondary | ICD-10-CM | POA: Diagnosis not present

## 2019-06-09 DIAGNOSIS — Z7409 Other reduced mobility: Secondary | ICD-10-CM | POA: Diagnosis not present

## 2019-06-09 DIAGNOSIS — E785 Hyperlipidemia, unspecified: Secondary | ICD-10-CM | POA: Diagnosis not present

## 2019-06-09 DIAGNOSIS — D649 Anemia, unspecified: Secondary | ICD-10-CM | POA: Diagnosis not present

## 2019-06-09 DIAGNOSIS — I5042 Chronic combined systolic (congestive) and diastolic (congestive) heart failure: Secondary | ICD-10-CM | POA: Diagnosis not present

## 2019-06-09 DIAGNOSIS — N183 Chronic kidney disease, stage 3 (moderate): Secondary | ICD-10-CM | POA: Diagnosis not present

## 2019-06-09 DIAGNOSIS — E1151 Type 2 diabetes mellitus with diabetic peripheral angiopathy without gangrene: Secondary | ICD-10-CM | POA: Diagnosis not present

## 2019-06-09 DIAGNOSIS — J449 Chronic obstructive pulmonary disease, unspecified: Secondary | ICD-10-CM | POA: Diagnosis not present

## 2019-06-09 DIAGNOSIS — Z9119 Patient's noncompliance with other medical treatment and regimen: Secondary | ICD-10-CM | POA: Diagnosis not present

## 2019-06-09 DIAGNOSIS — Z7902 Long term (current) use of antithrombotics/antiplatelets: Secondary | ICD-10-CM | POA: Diagnosis not present

## 2019-06-09 DIAGNOSIS — E559 Vitamin D deficiency, unspecified: Secondary | ICD-10-CM | POA: Diagnosis not present

## 2019-06-09 DIAGNOSIS — E1122 Type 2 diabetes mellitus with diabetic chronic kidney disease: Secondary | ICD-10-CM | POA: Diagnosis not present

## 2019-06-09 DIAGNOSIS — I130 Hypertensive heart and chronic kidney disease with heart failure and stage 1 through stage 4 chronic kidney disease, or unspecified chronic kidney disease: Secondary | ICD-10-CM | POA: Diagnosis not present

## 2019-06-09 DIAGNOSIS — I4891 Unspecified atrial fibrillation: Secondary | ICD-10-CM | POA: Diagnosis not present

## 2019-06-12 ENCOUNTER — Other Ambulatory Visit: Payer: Self-pay | Admitting: Family Medicine

## 2019-06-12 ENCOUNTER — Telehealth: Payer: Self-pay | Admitting: *Deleted

## 2019-06-12 DIAGNOSIS — N183 Chronic kidney disease, stage 3 (moderate): Secondary | ICD-10-CM | POA: Diagnosis not present

## 2019-06-12 DIAGNOSIS — J449 Chronic obstructive pulmonary disease, unspecified: Secondary | ICD-10-CM | POA: Diagnosis not present

## 2019-06-12 DIAGNOSIS — Z7902 Long term (current) use of antithrombotics/antiplatelets: Secondary | ICD-10-CM | POA: Diagnosis not present

## 2019-06-12 DIAGNOSIS — G894 Chronic pain syndrome: Secondary | ICD-10-CM | POA: Diagnosis not present

## 2019-06-12 DIAGNOSIS — I4891 Unspecified atrial fibrillation: Secondary | ICD-10-CM | POA: Diagnosis not present

## 2019-06-12 DIAGNOSIS — E1151 Type 2 diabetes mellitus with diabetic peripheral angiopathy without gangrene: Secondary | ICD-10-CM | POA: Diagnosis not present

## 2019-06-12 DIAGNOSIS — E559 Vitamin D deficiency, unspecified: Secondary | ICD-10-CM | POA: Diagnosis not present

## 2019-06-12 DIAGNOSIS — I5042 Chronic combined systolic (congestive) and diastolic (congestive) heart failure: Secondary | ICD-10-CM | POA: Diagnosis not present

## 2019-06-12 DIAGNOSIS — D649 Anemia, unspecified: Secondary | ICD-10-CM | POA: Diagnosis not present

## 2019-06-12 DIAGNOSIS — E1140 Type 2 diabetes mellitus with diabetic neuropathy, unspecified: Secondary | ICD-10-CM | POA: Diagnosis not present

## 2019-06-12 DIAGNOSIS — I130 Hypertensive heart and chronic kidney disease with heart failure and stage 1 through stage 4 chronic kidney disease, or unspecified chronic kidney disease: Secondary | ICD-10-CM | POA: Diagnosis not present

## 2019-06-12 DIAGNOSIS — Z7409 Other reduced mobility: Secondary | ICD-10-CM | POA: Diagnosis not present

## 2019-06-12 DIAGNOSIS — Z9119 Patient's noncompliance with other medical treatment and regimen: Secondary | ICD-10-CM | POA: Diagnosis not present

## 2019-06-12 DIAGNOSIS — E1122 Type 2 diabetes mellitus with diabetic chronic kidney disease: Secondary | ICD-10-CM | POA: Diagnosis not present

## 2019-06-12 DIAGNOSIS — E785 Hyperlipidemia, unspecified: Secondary | ICD-10-CM | POA: Diagnosis not present

## 2019-06-12 NOTE — Telephone Encounter (Signed)
States that BP was 160/110 before she left (after she took her medication) States patient doesn't seen very compliant and hasn't been checking her sugar even though the nurse showed her how to use it last week. No chest pain or headache- nurse just wanted you to be aware

## 2019-06-12 NOTE — Telephone Encounter (Signed)
Janett Billow with [facility] was calling about Ms. Bloodworth. She went out to see her today and her bp was 190/130. She had not taken her bp meds as she was just gettiing up. She had no chest pains just a headache. Would like a return call back at [phone]

## 2019-06-13 DIAGNOSIS — I509 Heart failure, unspecified: Secondary | ICD-10-CM | POA: Diagnosis not present

## 2019-06-13 DIAGNOSIS — R062 Wheezing: Secondary | ICD-10-CM | POA: Diagnosis not present

## 2019-06-13 NOTE — Telephone Encounter (Signed)
Needs in office oV scheduled with medications , to addressed BP and get flu vaccine

## 2019-06-13 NOTE — Telephone Encounter (Signed)
Nothing else we can do

## 2019-06-13 NOTE — Telephone Encounter (Signed)
We have been trying for the past 6 months to get her assistance with housekeeping but the personal care aids only help with ADLs and that does not include housekeeping. I don't know what else to do about this

## 2019-06-13 NOTE — Telephone Encounter (Signed)
Called pt she has an appt to come in next week. She wanted to let Dr. Moshe Cipro know that the nurses she sent out are great but she still needs someone to help her with light house keeping

## 2019-06-14 ENCOUNTER — Other Ambulatory Visit (INDEPENDENT_AMBULATORY_CARE_PROVIDER_SITE_OTHER): Payer: Self-pay | Admitting: Family

## 2019-06-15 ENCOUNTER — Other Ambulatory Visit: Payer: Self-pay

## 2019-06-15 DIAGNOSIS — Z9119 Patient's noncompliance with other medical treatment and regimen: Secondary | ICD-10-CM | POA: Diagnosis not present

## 2019-06-15 DIAGNOSIS — E785 Hyperlipidemia, unspecified: Secondary | ICD-10-CM | POA: Diagnosis not present

## 2019-06-15 DIAGNOSIS — D649 Anemia, unspecified: Secondary | ICD-10-CM | POA: Diagnosis not present

## 2019-06-15 DIAGNOSIS — G894 Chronic pain syndrome: Secondary | ICD-10-CM | POA: Diagnosis not present

## 2019-06-15 DIAGNOSIS — E1122 Type 2 diabetes mellitus with diabetic chronic kidney disease: Secondary | ICD-10-CM | POA: Diagnosis not present

## 2019-06-15 DIAGNOSIS — E559 Vitamin D deficiency, unspecified: Secondary | ICD-10-CM | POA: Diagnosis not present

## 2019-06-15 DIAGNOSIS — Z7902 Long term (current) use of antithrombotics/antiplatelets: Secondary | ICD-10-CM | POA: Diagnosis not present

## 2019-06-15 DIAGNOSIS — I130 Hypertensive heart and chronic kidney disease with heart failure and stage 1 through stage 4 chronic kidney disease, or unspecified chronic kidney disease: Secondary | ICD-10-CM | POA: Diagnosis not present

## 2019-06-15 DIAGNOSIS — E1151 Type 2 diabetes mellitus with diabetic peripheral angiopathy without gangrene: Secondary | ICD-10-CM | POA: Diagnosis not present

## 2019-06-15 DIAGNOSIS — I5042 Chronic combined systolic (congestive) and diastolic (congestive) heart failure: Secondary | ICD-10-CM | POA: Diagnosis not present

## 2019-06-15 DIAGNOSIS — E1140 Type 2 diabetes mellitus with diabetic neuropathy, unspecified: Secondary | ICD-10-CM | POA: Diagnosis not present

## 2019-06-15 DIAGNOSIS — N183 Chronic kidney disease, stage 3 (moderate): Secondary | ICD-10-CM | POA: Diagnosis not present

## 2019-06-15 DIAGNOSIS — J449 Chronic obstructive pulmonary disease, unspecified: Secondary | ICD-10-CM | POA: Diagnosis not present

## 2019-06-15 DIAGNOSIS — Z7409 Other reduced mobility: Secondary | ICD-10-CM | POA: Diagnosis not present

## 2019-06-15 DIAGNOSIS — I4891 Unspecified atrial fibrillation: Secondary | ICD-10-CM | POA: Diagnosis not present

## 2019-06-15 NOTE — Patient Outreach (Signed)
[place] [facility]) Care Management  06/15/2019  [name] 1966/07/18 [id number]   [facility] CM Care Plan Problem One     Most Recent Value  Care Plan Problem One  Risk for Admission  Role Documenting the Problem One  Care Management [facility] for Problem One  Active  [facility]N Long Term Goal   Patient will not be hospitalized over the next 90 days.  THN Long Term Goal Start Date  06/01/19  Interventions for Problem One Long Term Goal  Reinforced instructions regarding medication adherence and treatment recommendations.  [facility] CM Short Term Goal #1   Over the next 30 days, patient will take all medications as prescribed.  [facility]N CM Short Term Goal #1 Start Date  06/01/19  Interventions for Short Term Goal #1  Encouraged to take medications as prescribed. BP noted to be elevated during a recent visit with [facility] nurse. Patient admits to not always taking morning medications as prescribed.  [Using med adherence packages via [facility].]  [facility] CM Short Term Goal #2   Over the next 30 days, patient will attend MD appointments as scheduled.  [facility]N CM Short Term Goal #2 Start Date  06/01/19  Interventions for Short Term Goal #2  Discussed transportation. Patient denies current transportation needs.  Reminded of pending appointments.  Encouraged to attend as scheduled to prevent delays in care. [Pending office visits with Primary Care and Cardiology.]  [facility]N CM Short Term Goal #3  Over the next 30 days, patient will monitor BP daily and maintain a log.  THN CM Short Term Goal #3 Start Date  06/01/19  Interventions for Short Tern Goal #3  Strongly encouraged to monitor BP daily and maintain log to identify trends. Reports not monitoring daily d/t [facility] nurse monitoring during visits. Discussed ideal BP range and indications for seeking medical attention.  [facility]N CM Short Term Goal #4  Over the next 30 days, patient will obtain glucometer and monitor blood glucose levels daily.  THN CM Short Term Goal  #4 Start Date  06/01/19  Interventions for Short Term Goal #4  Patient confirmed receipt of glucometer. Reports not monitoring daily since receiving the device. Encouraged to monitor daily and record readings.    [facility] CM Care Plan Problem Two     Most Recent Value  Care Plan Problem Two  High Risk for Falls  Role Documenting the Problem Two  Care Management Coordinator  Care Plan for Problem Two  Active  Interventions for Problem Two Long Term Goal   Reviewed fall prevention measures.  THN Long Term Goal  Patient will not experience falls over the next 60 days.  THN Long Term Goal Start Date  06/01/19  THN CM Short Term Goal #1   Over the next 30 days, patient will use assistive device when attempting to ambulate in the home.  [facility]N CM Short Term Goal #1 Start Date  06/01/19  Interventions for Short Term Goal #2   Reviewed home safey and fall prevention measures. Patient reports using rollator when ambulating in the home. Encouraged to continue using assistive device to prevent falls.  [facility]N CM Short Term Goal #2   Over the next 30 days, patient will practice home safety measures to prevent injuries.   [facility]N CM Short Term Goal #2 Start Date  06/01/19  Interventions for Short Term Goal #2  Reviewed home safety measures.        PLAN -[name] is anticipating first meal delivery later today. -  Will follow-up next week to review medications, BP readings and FBS ranges.   [place] Care Management [phone]

## 2019-06-16 ENCOUNTER — Other Ambulatory Visit: Payer: Self-pay | Admitting: *Deleted

## 2019-06-19 NOTE — Patient Outreach (Signed)
[place] [facility]) Care Management  06/19/2019  MIKENNA BRY 09-12-1966 [id number]   CSW called & spoke with patient who states that she declined PACE, that she has a very good relationship with Dr. Moshe Cipro and does not want to change primary care providers. States she just needs help cleaning her house due to mobility limitations. She is aware that these services are not provided by the home health CNAs due to their focus on personal assistance with bathing, dressing. CSW provided patient with the phone number to [facility] (ph#: [phone]) for private duty care. CSW also provided the phone number for [facility] program through [facility] [employer] [employer] [facility] (ph#: [phone]). Patient claims that she was able to use Medicaid in the past to pay for in-home services. Patient also asked about getting a "government phone" CSW provided patient with the phone number to [facility] (ph#: [phone]). Patient states that she has no further CSW needs at this time. CSW will sign off but encouraged patient to call back if any needs come up to call back.    Raynaldo Opitz, LCSW [facility]  Clinical Social Worker cell #: [phone]

## 2019-06-21 ENCOUNTER — Ambulatory Visit: Payer: Medicare Other | Admitting: Family Medicine

## 2019-06-23 ENCOUNTER — Other Ambulatory Visit: Payer: Self-pay

## 2019-06-23 NOTE — Patient Outreach (Signed)
[place] [facility]) Care Management  06/23/2019  ILIMA GRITZ [birth year] [id number]    Follow-up outreach with Ms. Ayad. She reports feeling pretty good today. No complaints of shortness of breath. Denies chest pain or palpitations. Denies headaches over the past week.  She reports compliance with taking medications as prescribed. She is currently using an alarm to ensure medications are taken on time.   Reports improvement with daily BP monitoring. She did not have her BP log at the time of the call but reports "normal" readings over the past week. Reports a morning BP of 120/82.   Discussed care management needs. Her nutritional concerns were addressed. She confirmed receipt of [facility] meals. She declined need for personal care but will require assistance with housekeeping and light tasks in the home. She discussed these concerns with Raynaldo Opitz, LCSW. She intends to contact the Companion Care coordinator next week to discuss available services.   Pending Cardiology outreach on 06/26/19. She declined need for transportation assistance.  PLAN -Will follow-up within two weeks.  [place] Care Management [phone]

## 2019-06-26 ENCOUNTER — Ambulatory Visit: Payer: Medicare Other | Admitting: Cardiovascular Disease

## 2019-06-26 DIAGNOSIS — I739 Peripheral vascular disease, unspecified: Secondary | ICD-10-CM | POA: Diagnosis not present

## 2019-06-26 DIAGNOSIS — E1140 Type 2 diabetes mellitus with diabetic neuropathy, unspecified: Secondary | ICD-10-CM | POA: Diagnosis not present

## 2019-06-26 DIAGNOSIS — M79672 Pain in left foot: Secondary | ICD-10-CM | POA: Diagnosis not present

## 2019-06-26 DIAGNOSIS — M79671 Pain in right foot: Secondary | ICD-10-CM | POA: Diagnosis not present

## 2019-06-27 ENCOUNTER — Encounter: Payer: Self-pay | Admitting: Cardiovascular Disease

## 2019-07-07 ENCOUNTER — Other Ambulatory Visit: Payer: Self-pay

## 2019-07-07 DIAGNOSIS — I5042 Chronic combined systolic (congestive) and diastolic (congestive) heart failure: Secondary | ICD-10-CM

## 2019-07-07 DIAGNOSIS — E1159 Type 2 diabetes mellitus with other circulatory complications: Secondary | ICD-10-CM

## 2019-07-07 DIAGNOSIS — I10 Essential (primary) hypertension: Secondary | ICD-10-CM

## 2019-07-07 DIAGNOSIS — I4891 Unspecified atrial fibrillation: Secondary | ICD-10-CM

## 2019-07-07 NOTE — Patient Outreach (Signed)
[place] [facility]) Care Management  07/07/2019  [name] [birth year] [id number]    Follow-up outreach with [name]. She reports being very tired but otherwise feeling well. Denies chest pain or palpitations. No complaints of shortness of breath. Reports ambulating well with rollator walker. Denies falls.  She continues to use an alarm to assist with medication adherence. Reports taking medications as prescribed for the past two weeks.  Reports but not attending the scheduled Cardiology appointment on 06/26/19. Agreeable to contacting the clinic today to schedule a new appointment.  She attempts to monitor her blood pressure daily. Reports not having her log at the time of the call. Unable to recall exact BP but reports a "normal" reading. Recalls systolic ranges in the 416'L. Reports diastolic ranges in the 84'T and 90's.  She admits to not monitoring her weight. She doubts that she will remember to weigh daily but is agreeable to weighing at least once a week. Reviewed CHF weight parameters and indications for notifying MD for weight gain greater than 5lbs in one week.   Reports not monitoring her FBS due to discomfort with the current lancets. She is aware of s/sx of hypoglycemia and hyperglycemia along with recommended interventions. I will follow-up with the pharmacy to discuss options.  Current Outpatient Medications on File Prior to Visit  Medication Sig Dispense Refill  . albuterol (PROAIR HFA) 108 (90 Base) MCG/ACT inhaler INHALE 2 PUFFS EVERY 6 HOURS AS NEEDED FOR SHORTNESS OF BREATH/WHEEZING. 8.5 g 0  . amLODipine (NORVASC) 10 MG tablet TAKE 1 TABLET BY MOUTH ONCE A DAY. 30 tablet 5  . blood glucose meter kit and supplies Dispense based on patient and insurance preference. Use to test blood sugar once daily (FOR ICD-10 E10.9, E11.9). 1 each 0  . cloNIDine (CATAPRES) 0.3 MG tablet TAKE 1 TABLET BY MOUTH AT BEDTIME FOR BLOOD PRESSURE 30 tablet 5  . clopidogrel  (PLAVIX) 75 MG tablet TAKE 1 TABLET BY MOUTH DAILY WITH BREAKFAST. 30 tablet 5  . DULoxetine (CYMBALTA) 30 MG capsule Take 60 mg by mouth daily.     Marland Kitchen ezetimibe (ZETIA) 10 MG tablet TAKE 1 TABLET BY MOUTH ONCE A DAY. 30 tablet 5  . furosemide (LASIX) 20 MG tablet TAKE 1 TABLET BY MOUTH ONCE A DAY. 30 tablet 0  . gabapentin (NEURONTIN) 300 MG capsule TAKE 1 CAPSULE BY MOUTH THREE TIMES A DAY. 90 capsule 0  . hydrOXYzine (ATARAX/VISTARIL) 25 MG tablet TAKE 1 TABLET BY MOUTH AT BEDTIME FOR ITCHING AND SLEEP. 30 tablet 2  . ipratropium-albuterol (DUONEB) 0.5-2.5 (3) MG/3ML SOLN Take 3 mLs by nebulization every 6 (six) hours as needed (wheezes or SOB). 360 mL 1  . metoprolol succinate (TOPROL-XL) 50 MG 24 hr tablet TAKE 1 TABLET BY MOUTH TWICE A DAY. 60 tablet 0  . montelukast (SINGULAIR) 10 MG tablet TAKE (1) TABLET BY MOUTH AT BEDTIME. 30 tablet 5  . olmesartan (BENICAR) 40 MG tablet TAKE 1 TABLET BY MOUTH ONCE A DAY. 30 tablet 5  . pantoprazole (PROTONIX) 40 MG tablet TAKE 1 TABLET BY MOUTH ONCE A DAY. 30 tablet 5  . potassium chloride SA (K-DUR) 20 MEQ tablet TAKE 1&1/2 TABLET BY MOUTH DAILY. 45 tablet 5  . rosuvastatin (CRESTOR) 10 MG tablet TAKE 1 TABLET BY MOUTH ONCE A DAY. 30 tablet 2  . solifenacin (VESICARE) 5 MG tablet TAKE 1 TABLET BY MOUTH ONCE DAILY. 30 tablet 2  . tiotropium (SPIRIVA HANDIHALER) 18 MCG inhalation capsule Place 18 mcg  into inhaler and inhale daily as needed (SHORTNESS OF BREATH).     . TRADJENTA 5 MG TABS tablet TAKE 1 TABLET BY MOUTH ONCE DAILY. 30 tablet 5  . traMADol (ULTRAM) 50 MG tablet Take by mouth every 8 (eight) hours as needed.    . traZODone (DESYREL) 50 MG tablet TAKE (1) TABLET BY MOUTH AT BEDTIME. 30 tablet 5  . [DISCONTINUED] amiodarone (PACERONE) 200 MG tablet Take 1 tablet (200 mg total) by mouth 2 (two) times daily. 60 tablet 2   No current facility-administered medications on file prior to visit.     [name] states her primary concern is  obtaining assistance in the home. She also expressed concerns regarding housing. She was previously receiving assistance from [employer] [place]. Per chart review, her case was closed on 06/16/19. She is requesting to reengage.    PLAN -Will contact Cardiology to schedule follow-up. -Will submit new referral for [facility] LCSW. -Will follow-up with [name] within two weeks.   [place] Care Management [phone]

## 2019-07-10 ENCOUNTER — Encounter: Payer: Self-pay | Admitting: Cardiovascular Disease

## 2019-07-10 ENCOUNTER — Telehealth (INDEPENDENT_AMBULATORY_CARE_PROVIDER_SITE_OTHER): Payer: Medicare Other | Admitting: Cardiovascular Disease

## 2019-07-10 ENCOUNTER — Other Ambulatory Visit: Payer: Self-pay

## 2019-07-10 VITALS — Ht 61.0 in | Wt 182.0 lb

## 2019-07-10 DIAGNOSIS — I471 Supraventricular tachycardia: Secondary | ICD-10-CM

## 2019-07-10 DIAGNOSIS — I5042 Chronic combined systolic (congestive) and diastolic (congestive) heart failure: Secondary | ICD-10-CM

## 2019-07-10 DIAGNOSIS — I43 Cardiomyopathy in diseases classified elsewhere: Secondary | ICD-10-CM | POA: Diagnosis not present

## 2019-07-10 DIAGNOSIS — I480 Paroxysmal atrial fibrillation: Secondary | ICD-10-CM

## 2019-07-10 DIAGNOSIS — N183 Chronic kidney disease, stage 3 unspecified: Secondary | ICD-10-CM

## 2019-07-10 NOTE — Progress Notes (Signed)
Virtual Visit via Telephone Note   This visit type was conducted due to national recommendations for restrictions regarding the COVID-19 Pandemic (e.g. social distancing) in an effort to limit this patient's exposure and mitigate transmission in our community.  Due to her co-morbid illnesses, this patient is at least at moderate risk for complications without adequate follow up.  This format is felt to be most appropriate for this patient at this time.  The patient did not have access to video technology/had technical difficulties with video requiring transitioning to audio format only (telephone).  All issues noted in this document were discussed and addressed.  No physical exam could be performed with this format.  Please refer to the patient's chart for her  consent to telehealth for [facility].   Date:  07/10/2019   ID:  [name], DOB 09-04-1966, MRN [id number]  Patient Location: Home Provider Location: Office  PCP:  [name], MD  Cardiologist:  Kate Sable, MD  Electrophysiologist:  None   Evaluation Performed:  Follow-Up Visit  Chief Complaint: Atrial fibrillation  History of Present Illness:    [name] is a 53 y.o. female with a history of paroxysmal atrial fibrillation and atrial tachycardia.  She also has a nonischemic cardiomyopathy.  I last saw her while she was hospitalized in August 2019.  She neither followed up with general cardiology nor EP.  She underwent a colonoscopy in July 2020.  A single polyp at the ileocecal valve was removed.  There were internal and external hemorrhoids.  The patient denies any symptoms of chest pain, palpitations, shortness of breath, lightheadedness, dizziness, leg swelling, orthopnea, PND, and syncope.  The patient does not have symptoms concerning for COVID-19 infection (fever, chills, cough, or new shortness of breath).    Past Medical History:  Diagnosis Date  . Alcohol use   . Ankle fracture,  lateral malleolus, closed 12/30/2011  . Anxiety   . Breast mass, left 12/15/2011  . Chronic anemia   . Chronic combined systolic and diastolic CHF (congestive heart failure) ([place])   . CKD (chronic kidney disease), stage III ([place])    pt denies  . Cocaine abuse ([place])   . COPD (chronic obstructive pulmonary disease) ([place])   . Diabetes mellitus, type 2 ([place])   . Diabetic Charcot foot ([place]) 12/30/2011  . Essential hypertension   . History of GI bleed 12/05/2009   Qualifier: Diagnosis of  By: Zeb Comfort    . Hyperlipidemia   . Hypertensive cardiomyopathy ([place]) 11/08/2012   a. 05/2016: echo showing EF of 20-25% with normal cors by cath in 07/2016.  . Morbid obesity ([place]) 01/31/2008   Qualifier: Diagnosis of  By: Lenn Cal    . Noncompliance with medication regimen   . Obesity   . Panic attacks   . PAT (paroxysmal atrial tachycardia) ([place])    a. started on amiodarone 07/2017 for this.  . Sleep apnea    does not use cpap  . Stroke ([place])    06/24/17  . Tobacco abuse   . Urinary incontinence    Past Surgical History:  Procedure Laterality Date  . BREAST BIOPSY    . CARDIAC CATHETERIZATION N/A 07/28/2016   Procedure: Left Heart Cath and Coronary Angiography;  Surgeon: Jettie Booze, MD;  Location: [place] [facility];  Service: Cardiovascular;  Laterality: N/A;  . COLONOSCOPY N/A 05/10/2019   Procedure: COLONOSCOPY;  Surgeon: Danie Binder, MD;  [facility];  Service: Endoscopy;  Laterality: N/A;  Phenergan 12.5 mg IV in pre-op  . DILATION AND CURETTAGE OF UTERUS    . I&D EXTREMITY Bilateral 09/22/2017   Procedure: BILATERAL DEBRIDEMENT LEG/FOOT ULCERS, APPLY VERAFLO WOUND VAC;  Surgeon: Newt Minion, MD;  Location: [place];  Service: Orthopedics;  Laterality: Bilateral;  . I&D EXTREMITY Right 10/11/2018   Procedure: IRRIGATION AND DEBRIDEMENT RIGHT HAND;  Surgeon: [name], MD;  Location: [place];  Service: Orthopedics;  Laterality: Right;  . I&D EXTREMITY Right  10/13/2018   Procedure: REPEAT IRRIGATION AND DEBRIDEMENT RIGHT HAND;  Surgeon: [name], MD;  Location: [place];  Service: Orthopedics;  Laterality: Right;  . I&D EXTREMITY Right 11/22/2018   Procedure: IRRIGATION AND DEBRIDEMENT AND PINNING RIGHT HAND;  Surgeon: [name], MD;  Location: [place];  Service: Orthopedics;  Laterality: Right;  . IR RADIOLOGIST EVAL & MGMT  07/05/2018  . POLYPECTOMY  05/10/2019   Procedure: POLYPECTOMY;  Surgeon: Danie Binder, MD;  [facility];  Service: Endoscopy;;  . SKIN SPLIT GRAFT Bilateral 09/28/2017   Procedure: BILATERAL SPLIT THICKNESS SKIN GRAFT LEGS/FEET AND APPLY VAC;  Surgeon: Newt Minion, MD;  Location: [place];  Service: Orthopedics;  Laterality: Bilateral;  . SKIN SPLIT GRAFT Right 11/22/2018   Procedure: SKIN GRAFT SPLIT THICKNESS;  Surgeon: [name], MD;  Location: [place];  Service: Orthopedics;  Laterality: Right;     Current Meds  Medication Sig  . albuterol (PROAIR HFA) 108 (90 Base) MCG/ACT inhaler INHALE 2 PUFFS EVERY 6 HOURS AS NEEDED FOR SHORTNESS OF BREATH/WHEEZING.  Marland Kitchen amLODipine (NORVASC) 10 MG tablet TAKE 1 TABLET BY MOUTH ONCE A DAY.  . blood glucose meter kit and supplies Dispense based on patient and insurance preference. Use to test blood sugar once daily (FOR ICD-10 E10.9, E11.9).  . cloNIDine (CATAPRES) 0.3 MG tablet TAKE 1 TABLET BY MOUTH AT BEDTIME FOR BLOOD PRESSURE  . clopidogrel (PLAVIX) 75 MG tablet TAKE 1 TABLET BY MOUTH DAILY WITH BREAKFAST.  . DULoxetine (CYMBALTA) 30 MG capsule Take 60 mg by mouth daily.   Marland Kitchen ezetimibe (ZETIA) 10 MG tablet TAKE 1 TABLET BY MOUTH ONCE A DAY.  . furosemide (LASIX) 20 MG tablet TAKE 1 TABLET BY MOUTH ONCE A DAY.  Marland Kitchen gabapentin (NEURONTIN) 300 MG capsule TAKE 1 CAPSULE BY MOUTH THREE TIMES A DAY.  . hydrOXYzine (ATARAX/VISTARIL) 25 MG tablet TAKE 1 TABLET BY MOUTH AT BEDTIME FOR ITCHING AND SLEEP.  Marland Kitchen ipratropium-albuterol (DUONEB) 0.5-2.5 (3) MG/3ML SOLN Take 3 mLs  by nebulization every 6 (six) hours as needed (wheezes or SOB).  . metoprolol succinate (TOPROL-XL) 50 MG 24 hr tablet TAKE 1 TABLET BY MOUTH TWICE A DAY.  . montelukast (SINGULAIR) 10 MG tablet TAKE (1) TABLET BY MOUTH AT BEDTIME.  Marland Kitchen olmesartan (BENICAR) 40 MG tablet TAKE 1 TABLET BY MOUTH ONCE A DAY.  . pantoprazole (PROTONIX) 40 MG tablet TAKE 1 TABLET BY MOUTH ONCE A DAY.  Marland Kitchen potassium chloride SA (K-DUR) 20 MEQ tablet TAKE 1&1/2 TABLET BY MOUTH DAILY.  . rosuvastatin (CRESTOR) 10 MG tablet TAKE 1 TABLET BY MOUTH ONCE A DAY.  . solifenacin (VESICARE) 5 MG tablet TAKE 1 TABLET BY MOUTH ONCE DAILY.  Marland Kitchen tiotropium (SPIRIVA HANDIHALER) 18 MCG inhalation capsule Place 18 mcg into inhaler and inhale daily as needed (SHORTNESS OF BREATH).   . TRADJENTA 5 MG TABS tablet TAKE 1 TABLET BY MOUTH ONCE DAILY.  . traMADol (ULTRAM) 50 MG tablet Take by mouth every 8 (eight) hours  as needed.  . traZODone (DESYREL) 50 MG tablet TAKE (1) TABLET BY MOUTH AT BEDTIME.  . [DISCONTINUED] amiodarone (PACERONE) 200 MG tablet Take 1 tablet (200 mg total) by mouth 2 (two) times daily.     Allergies:   Patient has no known allergies.   Social History   Tobacco Use  . Smoking status: Former Smoker    Packs/day: 1.00    Years: 20.00    Pack years: 20.00    Types: Cigarettes    Quit date: 06/03/2017    Years since quitting: 2.1  . Smokeless tobacco: Never Used  Substance Use Topics  . Alcohol use: No    Alcohol/week: 0.0 standard drinks    Comment: Quit in 2017  . Drug use: Not Currently    Frequency: 3.0 times per week    Types: Marijuana, Cocaine    Comment: none since August 2018     Family Hx: The patient's family history includes Arthritis in an other family member; Asthma in an other family member; Cancer in her sister and another family member; Diabetes in an other family member; Heart attack in her mother; Hypertension in her brother, daughter, father, mother, sister, sister, and son.  ROS:    Please see the history of present illness.     All other systems reviewed and are negative.   Prior CV studies:   The following studies were reviewed today:  Echocardiogram 06/22/2017:  - Left ventricle: The cavity size was normal. Wall thickness was   increased in a pattern of moderate LVH. Systolic function was   moderately to severely reduced. The estimated ejection fraction   was in the range of 30% to 35%. Diffuse hypokinesis. Features are   consistent with a pseudonormal left ventricular filling pattern,   with concomitant abnormal relaxation and increased filling   pressure (grade 2 diastolic dysfunction). - Mitral valve: There was moderate regurgitation directed   posteriorly. - Left atrium: The atrium was mildly dilated.  Event monitor 08/23/2017: Sinus rhythm with no arrhythmias  Labs/Other Tests and Data Reviewed:    EKG:  No ECG reviewed.  Recent Labs: 05/09/2019: ALT 12; BUN 24; Creatinine, Ser 1.30; Hemoglobin 14.0; Magnesium 1.8; Platelets 200; Potassium 4.1; Sodium 142; TSH 1.393   Recent Lipid Panel Lab Results  Component Value Date/Time   CHOL 144 12/07/2018 03:43 PM   TRIG 79 12/07/2018 03:43 PM   HDL 55 12/07/2018 03:43 PM   CHOLHDL 2.6 12/07/2018 03:43 PM   LDLCALC 73 12/07/2018 03:43 PM   LDLDIRECT 122 (H) 01/08/2010 08:18 AM    Wt Readings from Last 3 Encounters:  07/10/19 182 lb (82.6 kg)  05/24/19 186 lb 0.6 oz (84.4 kg)  05/10/19 186 lb 11.7 oz (84.7 kg)     Objective:    Vital Signs:  Ht '5\' 1"'  (1.549 m)   Wt 182 lb (82.6 kg)   LMP 05/19/2016   BMI 34.39 kg/m    VITAL SIGNS:  reviewed  ASSESSMENT & PLAN:    1.  Paroxysmal atrial fibrillation/atrial tachycardia: Symptomatically stable.  She is on Toprol-XL. Anticoagulated with Eliquis. Hemoglobin and platelets normal on 05/09/2019.  2.  Nonischemic cardiomyopathy/chronic systolic heart failure: Currently on Toprol-XL and Benicar.  No symptoms suggestive of hypervolemia.  Currently on  Lasix 20 mg daily.  3.  Chronic kidney disease stage III: Creatinine 1.3 on 05/09/2019.     COVID-19 Education: The signs and symptoms of COVID-19 were discussed with the patient and how to seek care for testing (  follow up with PCP or arrange E-visit).  The importance of social distancing was discussed today.  Time:   Today, I have spent 5 minutes with the patient with telehealth technology discussing the above problems.     Medication Adjustments/Labs and Tests Ordered: Current medicines are reviewed at length with the patient today.  Concerns regarding medicines are outlined above.   Tests Ordered: No orders of the defined types were placed in this encounter.   Medication Changes: No orders of the defined types were placed in this encounter.   Follow Up:  Virtual Visit or In Person in 6 month(s)  Signed, Kate Sable, MD  07/10/2019 8:27 AM    [place]

## 2019-07-10 NOTE — Addendum Note (Signed)
Addended by: [name] A on: 07/10/2019 08:40 AM   Modules accepted: Orders

## 2019-07-10 NOTE — Patient Outreach (Signed)
[place] [facility]) Care Management  07/10/2019  [name] 17-Feb-1966 [id number]   High priority referral received on Friday, 07/07/19 from [facility], [employer].  "Patient requesting outreach with [facility] LCSW to discuss concerns regarding housing and assistance in the home. She reports contacting [facility] services as instructed by LCSW and being informed that they do not accept her insurance coverage. She is requesting to follow-up and discuss other options. Also reports needing an update regarding housing and status of referral to the [employer]. Reports that she has not been able to pay her rent for several months and is concerned that she will be without housing very soon."  Successful outreach to patient today.  Inquired about resources that patient has attempted to use for rent assistance.  Patient reports that she has requested assistance through her church and the [employer] but they were not able to assist.   Patient was not familiar with referral being submitted to [employer].   Patient provided with contact information for the following: [place] Will submit referral to [place] [facility] via [employer] [name] platform.  Patient reports that she needs assistance in the home with housekeeping and meal preparation.  Patient has [place] Medicaid which only assists with Medicare premiums therefore she is not eligible for personal care services.  Patient reports that [facility] has paid for in-home assistance in the past.  Informed her that aide services are only covered if in conjunction with a skilled service.  Unfortunately, only option for in-home services is private pay which patient cannot afford.  Patient inquired about [employer] delivery being extended.  Was able to get approval for this and informed her that 4 weeks is the maximum extension.  She is currently on the wait list  for Meals on Wheels.   Will follow up with patient next week regarding resources provided and referral to [place] [place] 2-1-1.    Ronn Melena, BSW Social Worker [phone]

## 2019-07-10 NOTE — Patient Instructions (Signed)
Medication Instructions: Your physician recommends that you continue on your current medications as directed. Please refer to the Current Medication list given to you today.   Labwork: None  Procedures/Testing: None  Follow-Up: 6 months with Physician Assistant  Any Additional Special Instructions Will Be Listed Below (If Applicable).     If you need a refill on your cardiac medications before your next appointment, please call your pharmacy.      Thank you for choosing [facility] !

## 2019-07-11 ENCOUNTER — Other Ambulatory Visit: Payer: Self-pay | Admitting: Family Medicine

## 2019-07-11 ENCOUNTER — Other Ambulatory Visit (INDEPENDENT_AMBULATORY_CARE_PROVIDER_SITE_OTHER): Payer: Self-pay | Admitting: Orthopedic Surgery

## 2019-07-12 ENCOUNTER — Other Ambulatory Visit (INDEPENDENT_AMBULATORY_CARE_PROVIDER_SITE_OTHER): Payer: Self-pay | Admitting: Orthopedic Surgery

## 2019-07-13 DIAGNOSIS — N183 Chronic kidney disease, stage 3 (moderate): Secondary | ICD-10-CM

## 2019-07-13 DIAGNOSIS — D649 Anemia, unspecified: Secondary | ICD-10-CM

## 2019-07-13 DIAGNOSIS — J449 Chronic obstructive pulmonary disease, unspecified: Secondary | ICD-10-CM

## 2019-07-13 DIAGNOSIS — G894 Chronic pain syndrome: Secondary | ICD-10-CM

## 2019-07-13 DIAGNOSIS — E1140 Type 2 diabetes mellitus with diabetic neuropathy, unspecified: Secondary | ICD-10-CM

## 2019-07-13 DIAGNOSIS — F329 Major depressive disorder, single episode, unspecified: Secondary | ICD-10-CM

## 2019-07-13 DIAGNOSIS — E559 Vitamin D deficiency, unspecified: Secondary | ICD-10-CM

## 2019-07-13 DIAGNOSIS — I5042 Chronic combined systolic (congestive) and diastolic (congestive) heart failure: Secondary | ICD-10-CM

## 2019-07-13 DIAGNOSIS — I4891 Unspecified atrial fibrillation: Secondary | ICD-10-CM

## 2019-07-13 DIAGNOSIS — Z9119 Patient's noncompliance with other medical treatment and regimen: Secondary | ICD-10-CM

## 2019-07-13 DIAGNOSIS — Z7902 Long term (current) use of antithrombotics/antiplatelets: Secondary | ICD-10-CM

## 2019-07-13 DIAGNOSIS — F1410 Cocaine abuse, uncomplicated: Secondary | ICD-10-CM

## 2019-07-13 DIAGNOSIS — E1151 Type 2 diabetes mellitus with diabetic peripheral angiopathy without gangrene: Secondary | ICD-10-CM

## 2019-07-13 DIAGNOSIS — F332 Major depressive disorder, recurrent severe without psychotic features: Secondary | ICD-10-CM

## 2019-07-13 DIAGNOSIS — E785 Hyperlipidemia, unspecified: Secondary | ICD-10-CM

## 2019-07-13 DIAGNOSIS — E1122 Type 2 diabetes mellitus with diabetic chronic kidney disease: Secondary | ICD-10-CM

## 2019-07-13 DIAGNOSIS — Z7409 Other reduced mobility: Secondary | ICD-10-CM

## 2019-07-14 DIAGNOSIS — I509 Heart failure, unspecified: Secondary | ICD-10-CM | POA: Diagnosis not present

## 2019-07-14 DIAGNOSIS — R062 Wheezing: Secondary | ICD-10-CM | POA: Diagnosis not present

## 2019-07-17 ENCOUNTER — Other Ambulatory Visit: Payer: Self-pay

## 2019-07-17 NOTE — Patient Outreach (Signed)
[place] [facility]) Care Management  07/17/2019  [name] [birth year] [id number]   Follow up call to patient today regarding financial resources that were provided to possibly assist with rent payment.  Patient was provided with contact information for the following on 07/10/19:  [place] As of today, patient reports that she has not attempted to contact these agencies regarding assistance.  Informed patient that referral submitted to [place] [facility] was accepted on 07/14/19 which means representative is exploring resources and will contact her to follow up.   [facility] BSW closing case at this time due to requested resources being provided.  [name], BSW Social Worker [phone]

## 2019-07-21 ENCOUNTER — Other Ambulatory Visit: Payer: Self-pay

## 2019-07-21 NOTE — Patient Outreach (Signed)
[facility]) Care Management  07/21/2019  [name] 07-02-1966 [id number]   Follow-up outreach with [name]. Reports feeling very good today. She reports not weighing, monitoring her blood pressure or blood glucose for over a week. Reports not having her log available and could not recall last readings. Reports taking medications as prescribed.  Current Outpatient Medications on File Prior to Visit  Medication Sig Dispense Refill  . albuterol (PROAIR HFA) 108 (90 Base) MCG/ACT inhaler INHALE 2 PUFFS EVERY 6 HOURS AS NEEDED FOR SHORTNESS OF BREATH/WHEEZING. 8.5 g 0  . amLODipine (NORVASC) 10 MG tablet TAKE 1 TABLET BY MOUTH ONCE A DAY. 30 tablet 5  . apixaban (ELIQUIS) 5 MG TABS tablet Take 5 mg by mouth 2 (two) times daily.    . blood glucose meter kit and supplies Dispense based on patient and insurance preference. Use to test blood sugar once daily (FOR ICD-10 E10.9, E11.9). 1 each 0  . cloNIDine (CATAPRES) 0.3 MG tablet TAKE 1 TABLET BY MOUTH AT BEDTIME FOR BLOOD PRESSURE 30 tablet 5  . DULoxetine (CYMBALTA) 30 MG capsule Take 60 mg by mouth daily.     Marland Kitchen ezetimibe (ZETIA) 10 MG tablet TAKE 1 TABLET BY MOUTH ONCE A DAY. 30 tablet 5  . furosemide (LASIX) 20 MG tablet TAKE 1 TABLET BY MOUTH ONCE A DAY. 30 tablet 0  . gabapentin (NEURONTIN) 300 MG capsule TAKE 1 CAPSULE BY MOUTH THREE TIMES A DAY. 90 capsule 0  . hydrOXYzine (ATARAX/VISTARIL) 25 MG tablet TAKE 1 TABLET BY MOUTH AT BEDTIME FOR ITCHING AND SLEEP. 30 tablet 2  . ipratropium-albuterol (DUONEB) 0.5-2.5 (3) MG/3ML SOLN Take 3 mLs by nebulization every 6 (six) hours as needed (wheezes or SOB). 360 mL 1  . metoprolol succinate (TOPROL-XL) 50 MG 24 hr tablet TAKE 1 TABLET BY MOUTH TWICE A DAY. 60 tablet 0  . montelukast (SINGULAIR) 10 MG tablet TAKE (1) TABLET BY MOUTH AT BEDTIME. 30 tablet 5  . olmesartan (BENICAR) 40 MG tablet TAKE 1 TABLET BY MOUTH ONCE A DAY. 30 tablet 5  . pantoprazole (PROTONIX) 40 MG  tablet TAKE 1 TABLET BY MOUTH ONCE A DAY. 30 tablet 5  . potassium chloride SA (K-DUR) 20 MEQ tablet TAKE 1&1/2 TABLET BY MOUTH DAILY. 45 tablet 5  . rosuvastatin (CRESTOR) 10 MG tablet TAKE 1 TABLET BY MOUTH ONCE A DAY. 30 tablet 2  . solifenacin (VESICARE) 5 MG tablet TAKE 1 TABLET BY MOUTH ONCE DAILY. 30 tablet 2  . tiotropium (SPIRIVA HANDIHALER) 18 MCG inhalation capsule Place 18 mcg into inhaler and inhale daily as needed (SHORTNESS OF BREATH).     . TRADJENTA 5 MG TABS tablet TAKE 1 TABLET BY MOUTH ONCE DAILY. 30 tablet 5  . traMADol (ULTRAM) 50 MG tablet Take by mouth every 8 (eight) hours as needed.    . traZODone (DESYREL) 50 MG tablet TAKE (1) TABLET BY MOUTH AT BEDTIME. 30 tablet 5  . [DISCONTINUED] amiodarone (PACERONE) 200 MG tablet Take 1 tablet (200 mg total) by mouth 2 (two) times daily. 60 tablet 2   No current facility-administered medications on file prior to visit.     PLAN -Will follow-up next month to review BP, FBS and weight ranges.   [place] Care Management [phone]

## 2019-08-03 ENCOUNTER — Other Ambulatory Visit: Payer: Self-pay

## 2019-08-03 ENCOUNTER — Ambulatory Visit (INDEPENDENT_AMBULATORY_CARE_PROVIDER_SITE_OTHER): Payer: Medicare Other

## 2019-08-03 DIAGNOSIS — Z23 Encounter for immunization: Secondary | ICD-10-CM | POA: Diagnosis not present

## 2019-08-10 ENCOUNTER — Other Ambulatory Visit: Payer: Self-pay

## 2019-08-10 NOTE — Patient Outreach (Signed)
[place]) Care Management  08/10/2019  [name] 19-Feb-1966 [id number]   Follow-up outreach with Ms. Sheen. She reports doing well today. She did not recall her exact blood pressure and blood glucose readings. States "everything has been normal."  Discussed transition to a [place] to assist with long term self-management. She declined, but agreed to follow-up if additional assistance is needed.  Per chart review, the [facility] SW has provided information regarding community and housing resources. Will plan to complete case closure Current Outpatient Medications on File Prior to Visit  Medication Sig Dispense Refill  . albuterol (PROAIR HFA) 108 (90 Base) MCG/ACT inhaler INHALE 2 PUFFS EVERY 6 HOURS AS NEEDED FOR SHORTNESS OF BREATH/WHEEZING. 8.5 g 0  . amLODipine (NORVASC) 10 MG tablet TAKE 1 TABLET BY MOUTH ONCE A DAY. 30 tablet 5  . apixaban (ELIQUIS) 5 MG TABS tablet Take 5 mg by mouth 2 (two) times daily.    . blood glucose meter kit and supplies Dispense based on patient and insurance preference. Use to test blood sugar once daily (FOR ICD-10 E10.9, E11.9). 1 each 0  . cloNIDine (CATAPRES) 0.3 MG tablet TAKE 1 TABLET BY MOUTH AT BEDTIME FOR BLOOD PRESSURE 30 tablet 5  . DULoxetine (CYMBALTA) 30 MG capsule Take 60 mg by mouth daily.     Marland Kitchen ezetimibe (ZETIA) 10 MG tablet TAKE 1 TABLET BY MOUTH ONCE A DAY. 30 tablet 5  . furosemide (LASIX) 20 MG tablet TAKE 1 TABLET BY MOUTH ONCE A DAY. 30 tablet 0  . gabapentin (NEURONTIN) 300 MG capsule TAKE 1 CAPSULE BY MOUTH THREE TIMES A DAY. 90 capsule 0  . hydrOXYzine (ATARAX/VISTARIL) 25 MG tablet TAKE 1 TABLET BY MOUTH AT BEDTIME FOR ITCHING AND SLEEP. 30 tablet 2  . ipratropium-albuterol (DUONEB) 0.5-2.5 (3) MG/3ML SOLN Take 3 mLs by nebulization every 6 (six) hours as needed (wheezes or SOB). 360 mL 1  . metoprolol succinate (TOPROL-XL) 50 MG 24 hr tablet TAKE 1 TABLET BY MOUTH TWICE A DAY. 60 tablet 0  .  montelukast (SINGULAIR) 10 MG tablet TAKE (1) TABLET BY MOUTH AT BEDTIME. 30 tablet 5  . olmesartan (BENICAR) 40 MG tablet TAKE 1 TABLET BY MOUTH ONCE A DAY. 30 tablet 5  . pantoprazole (PROTONIX) 40 MG tablet TAKE 1 TABLET BY MOUTH ONCE A DAY. 30 tablet 5  . potassium chloride SA (K-DUR) 20 MEQ tablet TAKE 1&1/2 TABLET BY MOUTH DAILY. 45 tablet 5  . rosuvastatin (CRESTOR) 10 MG tablet TAKE 1 TABLET BY MOUTH ONCE A DAY. 30 tablet 2  . solifenacin (VESICARE) 5 MG tablet TAKE 1 TABLET BY MOUTH ONCE DAILY. 30 tablet 2  . tiotropium (SPIRIVA HANDIHALER) 18 MCG inhalation capsule Place 18 mcg into inhaler and inhale daily as needed (SHORTNESS OF BREATH).     . TRADJENTA 5 MG TABS tablet TAKE 1 TABLET BY MOUTH ONCE DAILY. 30 tablet 5  . traMADol (ULTRAM) 50 MG tablet Take by mouth every 8 (eight) hours as needed.    . traZODone (DESYREL) 50 MG tablet TAKE (1) TABLET BY MOUTH AT BEDTIME. 30 tablet 5  . [DISCONTINUED] amiodarone (PACERONE) 200 MG tablet Take 1 tablet (200 mg total) by mouth 2 (two) times daily. 60 tablet 2   No current facility-administered medications on file prior to visit.        [facility] CM Care Plan Problem One     Most Recent Value  Care Plan Problem One  Risk for Admission  Role  Documenting the Problem One  Care Management [place] for Problem One  Active  [facility]N Long Term Goal   Patient will not be hospitalized over the next 90 days.  [facility] Long Term Goal Start Date  06/01/19  Interventions for Problem One Long Term Goal  Encouraged ongoing outreach with a Health Coach to better assist with management of DM and HTN. Patient declined. Reports "normal" BP ranges but unable to recall exact readings.   [facility] CM Short Term Goal #1   Over the next 30 days, patient will take all medications as prescribed.  [facility] CM Short Term Goal #1 Start Date  06/01/19  [facility] CM Short Term Goal #1 Met Date  06/22/19  [facility] CM Short Term Goal #2   Over the next 30 days, patient will attend MD  appointments as scheduled.  [facility] CM Short Term Goal #2 Start Date  06/01/19  [facility] CM Short Term Goal #3  Over the next 30 days, patient will monitor BP daily and maintain a log.  THN CM Short Term Goal #3 Start Date  06/01/19  [facility] CM Short Term Goal #3 Met Date  06/22/19  [facility] CM Short Term Goal #4  Over the next 30 days, patient will obtain glucometer and monitor blood glucose levels daily.  [facility] CM Short Term Goal #4 Start Date  06/01/19  Interventions for Short Term Goal #4  Patient reports not monitoring her glucose levels for over a week. Encouraged to monitor FBS daily and log readings. Verbalized knowledge of s/sx of hypoglycemia and hyperglycemia. Reviewed worsening s/sx and indications for seeking medical attention.    [facility] CM Care Plan Problem Two     Most Recent Value  Care Plan Problem Two  High Risk for Falls  Role Documenting the Problem Two  Care Management Coordinator  Care Plan for Problem Two  Active  THN Long Term Goal  Patient will not experience falls over the next 60 days.  THN Long Term Goal Start Date  06/01/19  THN Long Term Goal Met Date  07/21/19  [facility] CM Short Term Goal #1   Over the next 30 days, patient will use assistive device when attempting to ambulate in the home.  [facility] CM Short Term Goal #1 Start Date  06/01/19  [facility] CM Short Term Goal #1 Met Date   06/22/19  [facility] CM Short Term Goal #2   Over the next 30 days, patient will practice home safety measures to prevent injuries.   [facility] CM Short Term Goal #2 Start Date  06/01/19  [facility] CM Short Term Goal #2 Met Date  06/22/19      PLAN -Will complete case closure.   [place] Care Management [phone]

## 2019-08-11 ENCOUNTER — Other Ambulatory Visit (INDEPENDENT_AMBULATORY_CARE_PROVIDER_SITE_OTHER): Payer: Self-pay | Admitting: Orthopedic Surgery

## 2019-08-11 ENCOUNTER — Other Ambulatory Visit: Payer: Self-pay | Admitting: Family Medicine

## 2019-08-11 DIAGNOSIS — I10 Essential (primary) hypertension: Secondary | ICD-10-CM

## 2019-08-11 NOTE — Telephone Encounter (Signed)
Attempted to call and notify patient. Phone only rang, no voicemail set up

## 2019-08-11 NOTE — Telephone Encounter (Signed)
Please advise 

## 2019-08-13 DIAGNOSIS — I509 Heart failure, unspecified: Secondary | ICD-10-CM | POA: Diagnosis not present

## 2019-08-13 DIAGNOSIS — R062 Wheezing: Secondary | ICD-10-CM | POA: Diagnosis not present

## 2019-08-22 IMAGING — US US RENAL
1 series · 14 of 25 positions shown · non-contrast
Comparison: None.

CLINICAL DATA: Acute kidney injury

EXAM:
RENAL / URINARY TRACT ULTRASOUND COMPLETE

[Series 1: us renal · 0.28mm/px · 14 of 25 slices shown]
[im 1/25]
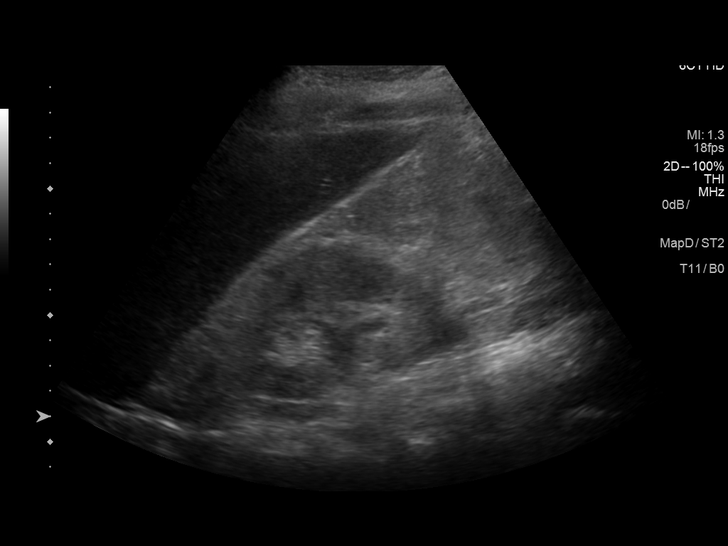
[im 3/25]
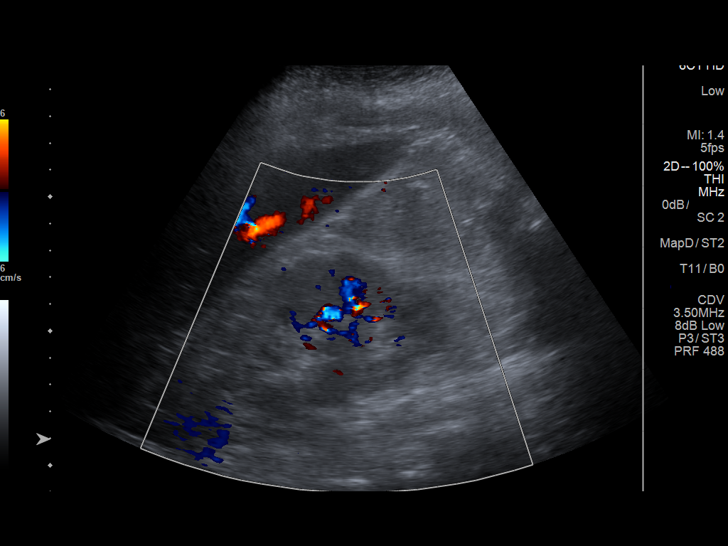
[im 5/25]
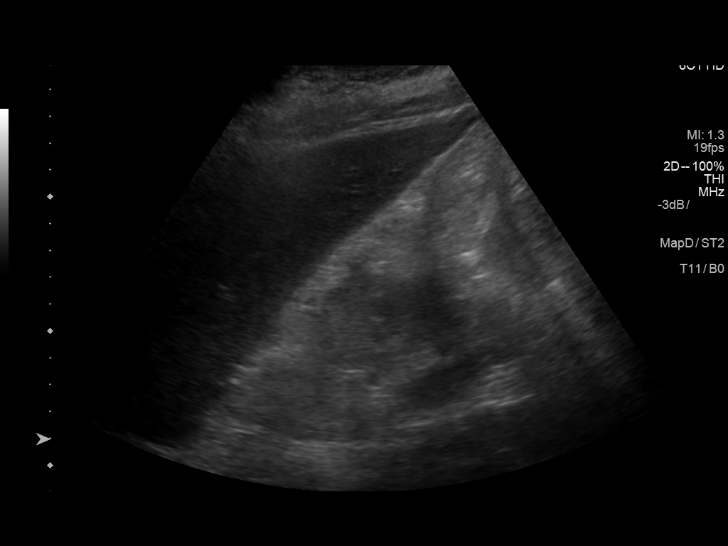
[im 7/25]
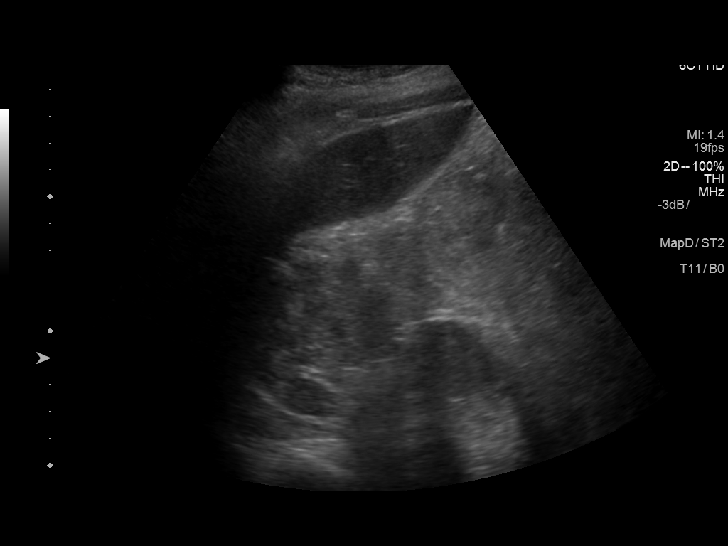
[im 9/25]
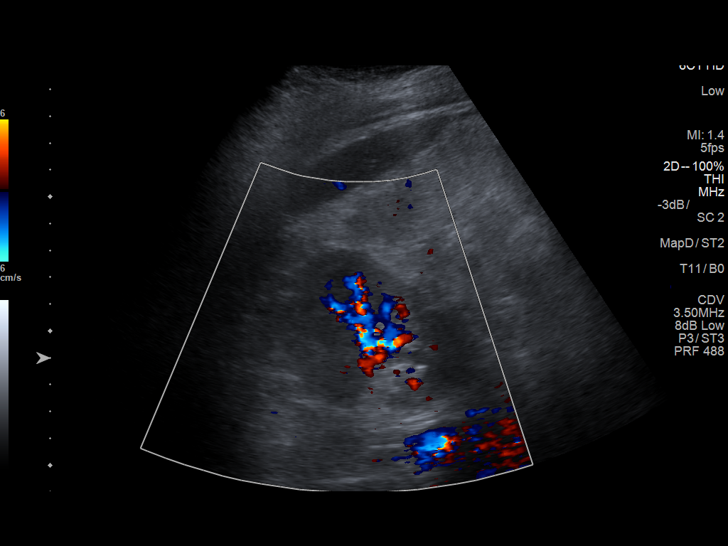
[im 10/25]
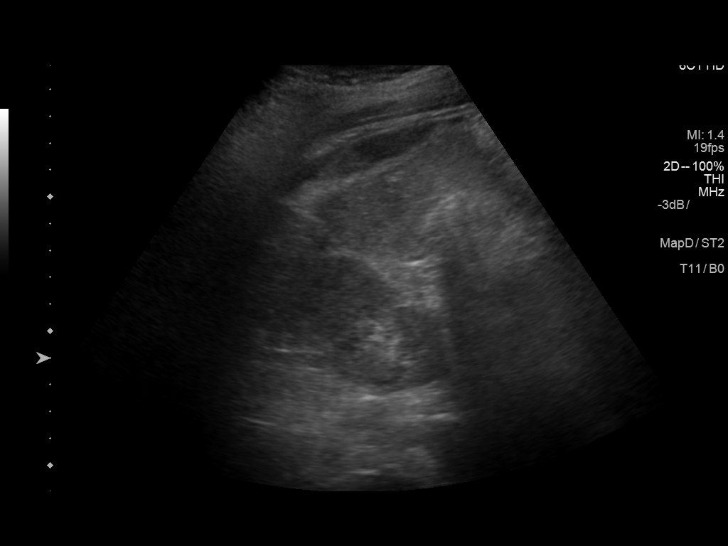
[im 12/25]
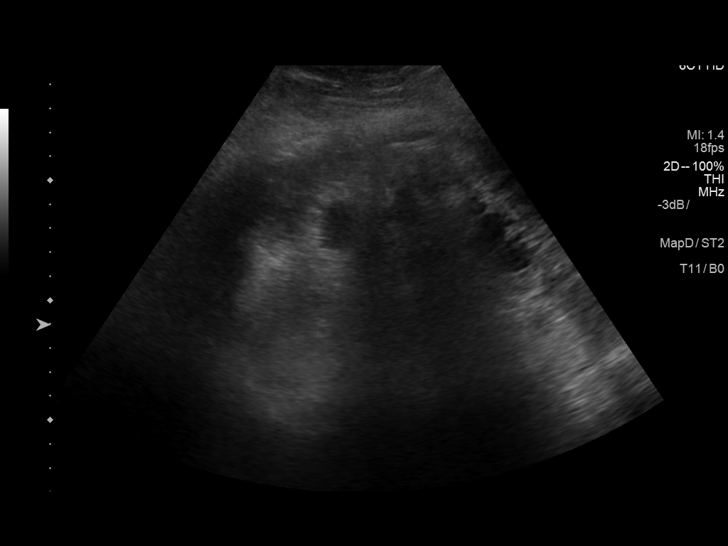
[im 14/25]
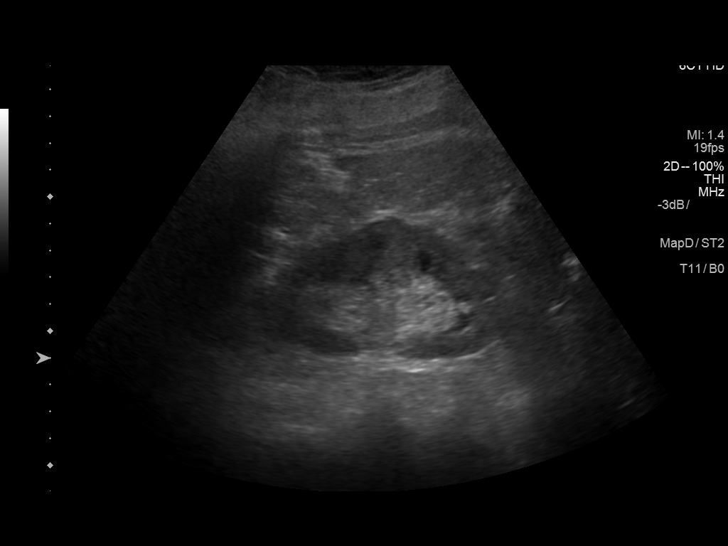
[im 16/25]
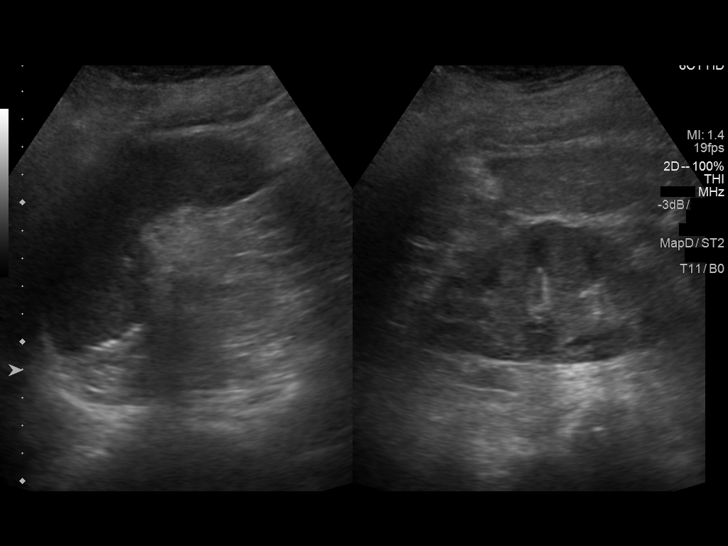
[im 17/25]
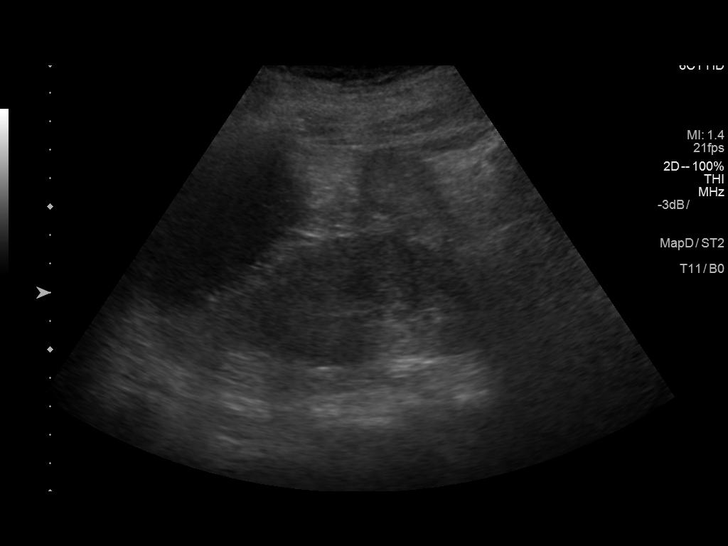
[im 19/25]
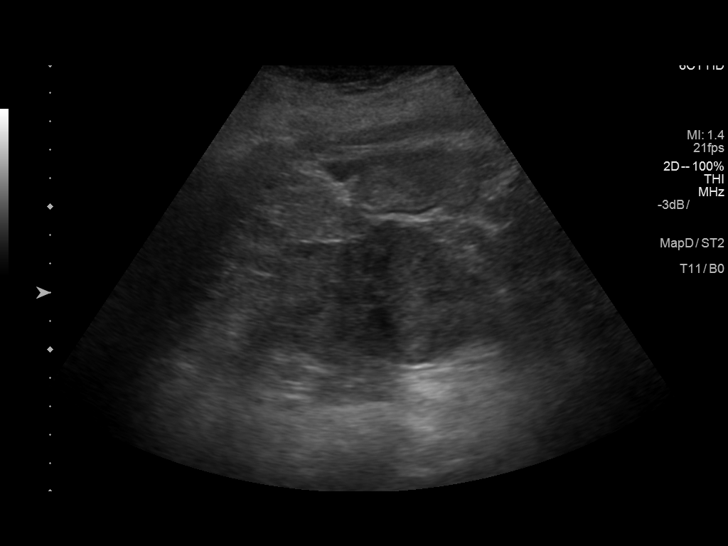
[im 21/25]
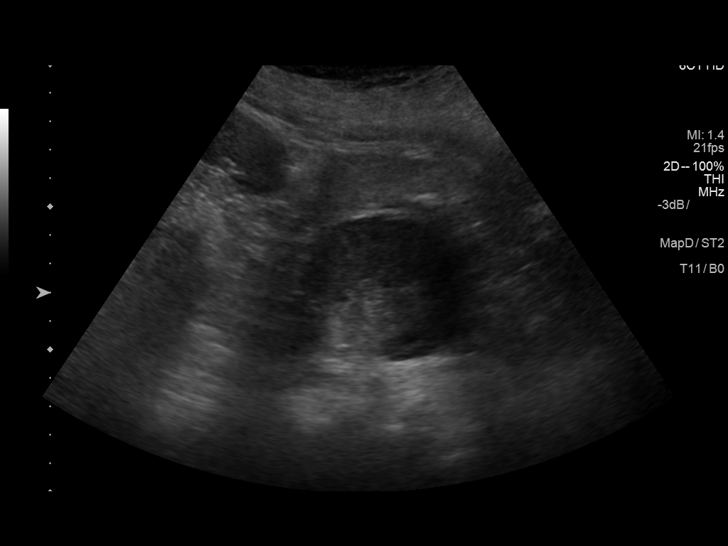
[im 23/25]
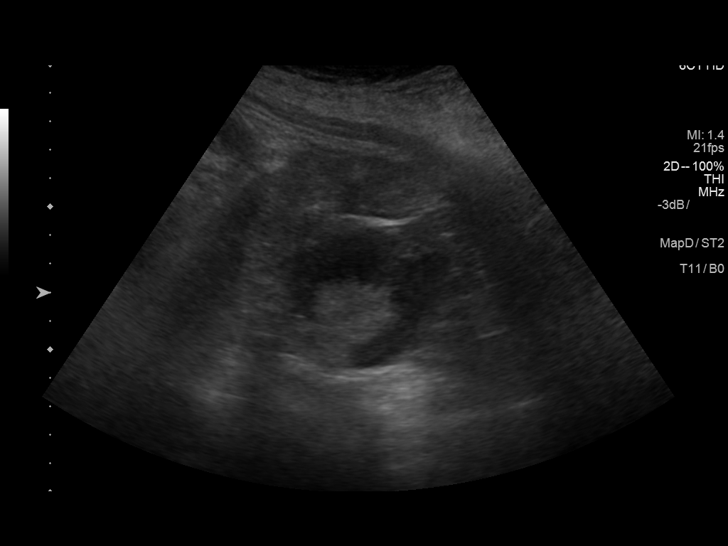
[im 25/25]
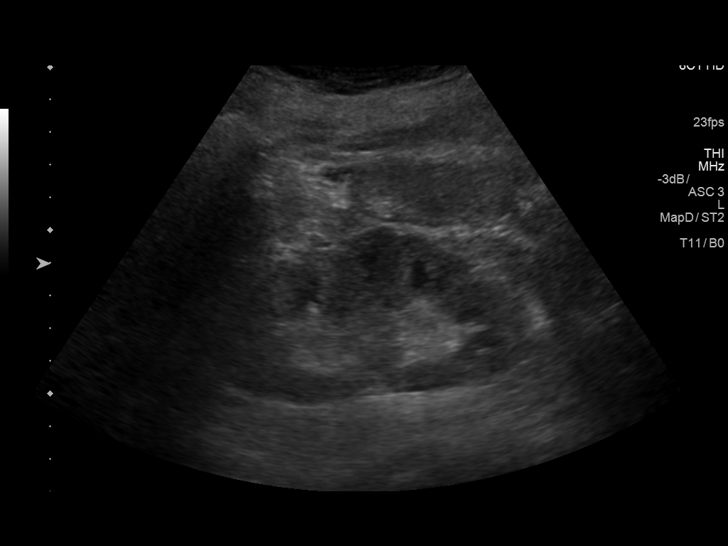

[14 of 25 positions shown; findings below may reference images not displayed]

FINDINGS: Right Kidney:

Length: 9.3 cm.  Echogenic renal parenchyma.  No hydronephrosis.

Left Kidney:

Length: 9.9 cm.  Echogenic renal parenchyma.  No hydronephrosis.

Bladder:

Not discretely visualized.
IMPRESSION: Echogenic renal parenchyma bilaterally, suggesting medical renal
disease.

No hydronephrosis.

## 2019-08-28 ENCOUNTER — Telehealth: Payer: Self-pay

## 2019-08-28 ENCOUNTER — Other Ambulatory Visit: Payer: Self-pay | Admitting: Family Medicine

## 2019-08-28 NOTE — Telephone Encounter (Signed)
Gets tramadol from [name] to call her  Also needs in office physical and pap with me, overdue, and likely needs updated labs. I see her on sched with h [place] for the foot exam, may want to change that as she needs a pap,

## 2019-08-28 NOTE — Telephone Encounter (Signed)
Pt would like a refill of the Tramodal

## 2019-08-28 NOTE — Telephone Encounter (Signed)
Please advise 

## 2019-09-01 NOTE — Telephone Encounter (Signed)
Advised 

## 2019-09-08 ENCOUNTER — Other Ambulatory Visit: Payer: Self-pay | Admitting: Family Medicine

## 2019-09-08 ENCOUNTER — Other Ambulatory Visit (INDEPENDENT_AMBULATORY_CARE_PROVIDER_SITE_OTHER): Payer: Self-pay | Admitting: Orthopedic Surgery

## 2019-09-11 ENCOUNTER — Other Ambulatory Visit: Payer: Self-pay | Admitting: Family Medicine

## 2019-09-11 ENCOUNTER — Telehealth: Payer: Self-pay | Admitting: *Deleted

## 2019-09-11 DIAGNOSIS — I6330 Cerebral infarction due to thrombosis of unspecified cerebral artery: Secondary | ICD-10-CM

## 2019-09-11 NOTE — Telephone Encounter (Signed)
Pt called said she was receiving some help from [employer] for in home services however this was discontinued and she still is unable to cook for herself. They were bringing her meals and wanted to see if Dr Moshe Cipro could start this back up for her.

## 2019-09-13 ENCOUNTER — Ambulatory Visit: Payer: Medicare Other | Admitting: Family Medicine

## 2019-09-13 ENCOUNTER — Encounter: Payer: Self-pay | Admitting: Family Medicine

## 2019-09-13 ENCOUNTER — Telehealth: Payer: Self-pay

## 2019-09-13 ENCOUNTER — Other Ambulatory Visit: Payer: Self-pay

## 2019-09-13 ENCOUNTER — Ambulatory Visit (INDEPENDENT_AMBULATORY_CARE_PROVIDER_SITE_OTHER): Payer: Medicare Other | Admitting: Family Medicine

## 2019-09-13 DIAGNOSIS — R05 Cough: Secondary | ICD-10-CM

## 2019-09-13 DIAGNOSIS — R059 Cough, unspecified: Secondary | ICD-10-CM

## 2019-09-13 DIAGNOSIS — R3981 Functional urinary incontinence: Secondary | ICD-10-CM | POA: Diagnosis not present

## 2019-09-13 DIAGNOSIS — R062 Wheezing: Secondary | ICD-10-CM | POA: Diagnosis not present

## 2019-09-13 DIAGNOSIS — I509 Heart failure, unspecified: Secondary | ICD-10-CM | POA: Diagnosis not present

## 2019-09-13 MED ORDER — DEXTROMETHORPHAN POLISTIREX ER 30 MG/5ML PO SUER: 30.0000 mg | ORAL | 0 refills | Status: DC | PRN

## 2019-09-13 MED ORDER — BENZONATATE 100 MG PO CAPS: 100.0000 mg | ORAL_CAPSULE | Freq: Three times a day (TID) | ORAL | 0 refills | Status: DC | PRN

## 2019-09-13 NOTE — Progress Notes (Signed)
Virtual Visit via Telephone Note   This visit type was conducted due to national recommendations for restrictions regarding the COVID-19 Pandemic (e.g. social distancing) in an effort to limit this patient's exposure and mitigate transmission in our community.  Due to her co-morbid illnesses, this patient is at least at moderate risk for complications without adequate follow up.  This format is felt to be most appropriate for this patient at this time.  The patient did not have access to video technology/had technical difficulties with video requiring transitioning to audio format only (telephone).  All issues noted in this document were discussed and addressed.  No physical exam could be performed with this format.  Evaluation Performed:  Follow-up visit  Date:  09/13/2019   ID:  AMMIE WARRICK, DOB 07-11-1966, MRN [id number]  Patient Location: Home Provider Location: Office  Location of Patient: Home Location of Provider: Telehealth Consent was obtain for visit to be over via telehealth. I verified that I am speaking with the correct person using two identifiers.  PCP:  [name], MD   Chief Complaint: Cough  History of Present Illness:    REDINA ZELLER is a 53 y.o. female with extensive history including anxiety, CKD, cocaine abuse, COPD, type 2 diabetes, hypertension, hyperlipidemia, obesity and medication. Reports today that she has had a cough for 4 days, nonproductive.  Has not been exposed to Covid.  Denies fevers chills, shortness of breath, sinus congestion or chest congestion, sore throat, ear pain, headache.  Just reports an ongoing cough.  Additionally reports that she has some urinary incontinence that is continuing even though she is on Vesicare has upcoming appointment.  The patient does not have symptoms concerning for COVID-19 infection (fever, chills, cough, or new shortness of breath).   Past Medical, Surgical, Social History, Allergies, and  Medications have been Reviewed.  Past Medical History:  Diagnosis Date  . Alcohol use   . Ankle fracture, lateral malleolus, closed 12/30/2011  . Anxiety   . Breast mass, left 12/15/2011  . Chronic anemia   . Chronic combined systolic and diastolic CHF (congestive heart failure) ([place])   . CKD (chronic kidney disease), stage III    pt denies  . Cocaine abuse ([place])   . COPD (chronic obstructive pulmonary disease) ([place])   . Diabetes mellitus, type 2 ([place])   . Diabetic Charcot foot ([place]) 12/30/2011  . Essential hypertension   . History of GI bleed 12/05/2009   Qualifier: Diagnosis of  By: Zeb Comfort    . Hyperlipidemia   . Hypertensive cardiomyopathy ([place]) 11/08/2012   a. 05/2016: echo showing EF of 20-25% with normal cors by cath in 07/2016.  . Morbid obesity ([place]) 01/31/2008   Qualifier: Diagnosis of  By: Lenn Cal    . Noncompliance with medication regimen   . Obesity   . Panic attacks   . PAT (paroxysmal atrial tachycardia) ([place])    a. started on amiodarone 07/2017 for this.  . Sleep apnea    does not use cpap  . Stroke ([place])    06/24/17  . Tobacco abuse   . Urinary incontinence    Past Surgical History:  Procedure Laterality Date  . BREAST BIOPSY    . CARDIAC CATHETERIZATION N/A 07/28/2016   Procedure: Left Heart Cath and Coronary Angiography;  Surgeon: Jettie Booze, MD;  Location: [facility];  Service: Cardiovascular;  Laterality: N/A;  . COLONOSCOPY N/A 05/10/2019   Procedure: COLONOSCOPY;  Surgeon: Oneida Alar,  Marga Melnick, MD;  [facility];  Service: Endoscopy;  Laterality: N/A;  Phenergan 12.5 mg IV in pre-op  . DILATION AND CURETTAGE OF UTERUS    . I&D EXTREMITY Bilateral 09/22/2017   Procedure: BILATERAL DEBRIDEMENT LEG/FOOT ULCERS, APPLY VERAFLO WOUND VAC;  Surgeon: Newt Minion, MD;  Location: [facility];  Service: Orthopedics;  Laterality: Bilateral;  . I&D EXTREMITY Right 10/11/2018   Procedure: IRRIGATION AND DEBRIDEMENT RIGHT HAND;  Surgeon:  [name], MD;  Location: [facility];  Service: Orthopedics;  Laterality: Right;  . I&D EXTREMITY Right 10/13/2018   Procedure: REPEAT IRRIGATION AND DEBRIDEMENT RIGHT HAND;  Surgeon: [name], MD;  Location: [facility];  Service: Orthopedics;  Laterality: Right;  . I&D EXTREMITY Right 11/22/2018   Procedure: IRRIGATION AND DEBRIDEMENT AND PINNING RIGHT HAND;  Surgeon: [name], MD;  Location: [facility];  Service: Orthopedics;  Laterality: Right;  . IR RADIOLOGIST EVAL & MGMT  07/05/2018  . POLYPECTOMY  05/10/2019   Procedure: POLYPECTOMY;  Surgeon: Danie Binder, MD;  [facility];  Service: Endoscopy;;  . SKIN SPLIT GRAFT Bilateral 09/28/2017   Procedure: BILATERAL SPLIT THICKNESS SKIN GRAFT LEGS/FEET AND APPLY VAC;  Surgeon: Newt Minion, MD;  Location: [facility];  Service: Orthopedics;  Laterality: Bilateral;  . SKIN SPLIT GRAFT Right 11/22/2018   Procedure: SKIN GRAFT SPLIT THICKNESS;  Surgeon: [name], MD;  Location: [facility];  Service: Orthopedics;  Laterality: Right;     Current Meds  Medication Sig  . albuterol (PROAIR HFA) 108 (90 Base) MCG/ACT inhaler INHALE 2 PUFFS EVERY 6 HOURS AS NEEDED FOR SHORTNESS OF BREATH/WHEEZING.  Marland Kitchen amLODipine (NORVASC) 10 MG tablet TAKE 1 TABLET BY MOUTH ONCE A DAY.  Marland Kitchen apixaban (ELIQUIS) 5 MG TABS tablet Take 5 mg by mouth 2 (two) times daily.  . blood glucose meter kit and supplies Dispense based on patient and insurance preference. Use to test blood sugar once daily (FOR ICD-10 E10.9, E11.9).  . cloNIDine (CATAPRES) 0.3 MG tablet TAKE 1 TABLET BY MOUTH AT BEDTIME FOR BLOOD PRESSURE  . clopidogrel (PLAVIX) 75 MG tablet TAKE 1 TABLET BY MOUTH DAILY WITH BREAKFAST.  . DULoxetine (CYMBALTA) 30 MG capsule Take 60 mg by mouth daily.   Marland Kitchen ezetimibe (ZETIA) 10 MG tablet TAKE 1 TABLET BY MOUTH ONCE A DAY.  . furosemide (LASIX) 20 MG tablet TAKE 1 TABLET BY MOUTH ONCE A DAY.  Marland Kitchen gabapentin (NEURONTIN) 300 MG capsule TAKE 1 CAPSULE BY MOUTH THREE  TIMES A DAY.  . hydrOXYzine (ATARAX/VISTARIL) 25 MG tablet TAKE 1 TABLET BY MOUTH AT BEDTIME FOR ITCHING AND SLEEP.  Marland Kitchen ipratropium-albuterol (DUONEB) 0.5-2.5 (3) MG/3ML SOLN Take 3 mLs by nebulization every 6 (six) hours as needed (wheezes or SOB).  . metoprolol succinate (TOPROL-XL) 50 MG 24 hr tablet TAKE 1 TABLET BY MOUTH TWICE A DAY.  . montelukast (SINGULAIR) 10 MG tablet TAKE (1) TABLET BY MOUTH AT BEDTIME.  Marland Kitchen olmesartan (BENICAR) 40 MG tablet TAKE 1 TABLET BY MOUTH ONCE A DAY.  . pantoprazole (PROTONIX) 40 MG tablet TAKE 1 TABLET BY MOUTH ONCE A DAY.  Marland Kitchen potassium chloride SA (KLOR-CON) 20 MEQ tablet TAKE 1&1/2 TABLET BY MOUTH DAILY.  . rosuvastatin (CRESTOR) 10 MG tablet TAKE 1 TABLET BY MOUTH ONCE A DAY.  . solifenacin (VESICARE) 5 MG tablet TAKE 1 TABLET BY MOUTH ONCE DAILY.  Marland Kitchen tiotropium (SPIRIVA HANDIHALER) 18 MCG inhalation capsule Place 18 mcg into inhaler and inhale daily as needed (SHORTNESS OF BREATH).   Marland Kitchen  TRADJENTA 5 MG TABS tablet TAKE 1 TABLET BY MOUTH ONCE DAILY.  . traMADol (ULTRAM) 50 MG tablet Take by mouth every 8 (eight) hours as needed.  . traZODone (DESYREL) 50 MG tablet TAKE (1) TABLET BY MOUTH AT BEDTIME.     Allergies:   Patient has no known allergies.   Social History   Tobacco Use  . Smoking status: Former Smoker    Packs/day: 1.00    Years: 20.00    Pack years: 20.00    Types: Cigarettes    Quit date: 06/03/2017    Years since quitting: 2.2  . Smokeless tobacco: Never Used  Substance Use Topics  . Alcohol use: No    Alcohol/week: 0.0 standard drinks    Comment: Quit in 2017  . Drug use: Not Currently    Frequency: 3.0 times per week    Types: Marijuana, Cocaine    Comment: none since August 2018     Family Hx: The patient's family history includes Arthritis in an other family member; Asthma in an other family member; Cancer in her sister and another family member; Diabetes in an other family member; Heart attack in her mother; Hypertension in her  brother, daughter, father, mother, sister, sister, and son.  ROS:   Please see the history of present illness.    All other systems reviewed and are negative.   Labs/Other Tests and Data Reviewed:    Recent Labs: 05/09/2019: ALT 12; BUN 24; Creatinine, Ser 1.30; Hemoglobin 14.0; Magnesium 1.8; Platelets 200; Potassium 4.1; Sodium 142; TSH 1.393   Recent Lipid Panel Lab Results  Component Value Date/Time   CHOL 144 12/07/2018 03:43 PM   TRIG 79 12/07/2018 03:43 PM   HDL 55 12/07/2018 03:43 PM   CHOLHDL 2.6 12/07/2018 03:43 PM   LDLCALC 73 12/07/2018 03:43 PM   LDLDIRECT 122 (H) 01/08/2010 08:18 AM    Wt Readings from Last 3 Encounters:  07/10/19 182 lb (82.6 kg)  05/24/19 186 lb 0.6 oz (84.4 kg)  05/10/19 186 lb 11.7 oz (84.7 kg)     Objective:    Vital Signs:  LMP 05/19/2016    GEN:  alert and oriented RESPIRATORY:  no shortness of breath in conversation PSYCH:  normal affect and mood  ASSESSMENT & PLAN:    1. Cough Nonproductive cough, prescribed Tessalon Perles and cough syrup.  Encouraged to stay hydrated if signs and symptoms change and she seems like she may be developing a chest cold and/or infection she is advised to call back. - benzonatate (TESSALON) 100 MG capsule; Take 1 capsule (100 mg total) by mouth 3 (three) times daily as needed for cough.  Dispense: 30 capsule; Refill: 0 - dextromethorphan (DELSYM) 30 MG/5ML liquid; Take 5 mLs (30 mg total) by mouth as needed for cough.  Dispense: 148 mL; Refill: 0  2. Functional urinary incontinence Reports ongoing urinary incontinence.  Reports taking all her medications as directed.  Does have upcoming appointment can address this in more detail if needed.   Time:   Today, I have spent 10 minutes with the patient with telehealth technology discussing the above problems.     Medication Adjustments/Labs and Tests Ordered: Current medicines are reviewed at length with the patient today.  Concerns regarding  medicines are outlined above.   Tests Ordered: No orders of the defined types were placed in this encounter.   Medication Changes: Meds ordered this encounter  Medications  . benzonatate (TESSALON) 100 MG capsule    Sig: Take 1  capsule (100 mg total) by mouth 3 (three) times daily as needed for cough.    Dispense:  30 capsule    Refill:  0    Order Specific Question:   Supervising Provider    Answer:   SIMPSON, MARGARET E [[id number]]  . dextromethorphan (DELSYM) 30 MG/5ML liquid    Sig: Take 5 mLs (30 mg total) by mouth as needed for cough.    Dispense:  148 mL    Refill:  0    Order Specific Question:   Supervising Provider    Answer:   [name] [[id number]]    Disposition:  Follow up as scheduled Signed, Perlie Mayo, NP  09/13/2019 2:22 PM     [facility]

## 2019-09-13 NOTE — Patient Instructions (Addendum)
  I appreciate the opportunity to provide you with care for your health and wellness. Today we discussed: cough   Follow up: 09/27/2019 as scheduled  No labs or referrals today  I hope that the Tessalon Perles and cough syrup help with your cough.  If you continue to have urinary incontinence please speak with [name] at your next appointment.  I hope you have a wonderful, happy, safe, and healthy Holiday Season!  Please continue to practice social distancing to keep you, your family, and our community safe.  If you must go out, please wear a mask and practice good handwashing.  It was a pleasure to see you and I look forward to continuing to work together on your health and well-being. Please do not hesitate to call the office if you need care or have questions about your care.  Have a wonderful day and week. With Gratitude, Cherly Beach, DNP, AGNP-BC

## 2019-09-13 NOTE — Telephone Encounter (Signed)
Spoke with patient and advised her insurance doesn't pay for prescription cough medications. She could pay out of pocket or buy a cough med otc. She asked if the provider could call something else in. I advised her yes she could call something else in, but more than likely that wasn't going to be covered either. She thanked me for returning the call.

## 2019-09-13 NOTE — Telephone Encounter (Signed)
Returned patients call to let her know that most insurances wont cover cough medications at all. No answer, no vm.

## 2019-09-13 NOTE — Telephone Encounter (Signed)
Patient called and said her insurance would not cover the pearls and cough med you called in .  She asked for something else.  I asked her did she know what her insurance would cover and she said no.

## 2019-09-19 NOTE — Telephone Encounter (Signed)
Referred to [facility] again per pt

## 2019-09-20 ENCOUNTER — Other Ambulatory Visit: Payer: Self-pay

## 2019-09-20 NOTE — Patient Outreach (Signed)
[facility]) Care Management  09/20/2019  [name] 1966/04/22 [id number]   Social work referral received from [employer], [employer].   "Reason for consult patient states she was receiving meals through [facility] but it was stopped and she wanted to know how she could get this again or resources to provide meals for her " Successful outreach to patient today.  Patient reports that she is unable to prepare meals for herself.  She is currently on the wait list for Meals on Wheels and awaiting a return call from program about current status. Patient has been linked to Mom's Meals program through [facility] before and was granted a four week extension of delivery in September. Will request another extension but informed patient that it may not be granted.  Inquired about patient's for obtaining meals after 30 day extension if it is granted.  Patient stated, "I am trying to get somebody to help me in my home." Per patient, she MQ[place] Medicaid which only assists with Medicare premiums.  She stated that she has been in communication with caseworker at [facility] to see if she can get approved for full Medicaid.  If approved, she can then request pcs.  Request sent to Matilde Bash, Quality Performance Evaluation Specialist, for extension of meal delivery.  Will follow up with patient when response is received.   Ronn Melena, BSW Social Worker [phone]

## 2019-09-25 ENCOUNTER — Other Ambulatory Visit: Payer: Self-pay

## 2019-09-25 NOTE — Patient Outreach (Signed)
[facility]) Care Management  09/25/2019  [name] 03-09-1966 [id number]   Received approval from Matilde Bash, [facility] Quality Performance Evaluation Specialist, for one more extension of meal delivery through [place].  Contacted patient today to inform her that she will receive two more deliveries; the first being on 09/29/19.   Patient was informed of option to continue meal delivery after this extension if she is able to pay for cost of meals.  Still waiting for response about exact cost for this.  Closing social work case at this time but will call patient to inform of cost for meals once confirmed.   Ronn Melena, BSW Social Worker [phone]

## 2019-09-27 ENCOUNTER — Ambulatory Visit: Payer: Medicare Other | Admitting: Family Medicine

## 2019-09-30 IMAGING — US US RENAL
1 series · 14 of 25 positions shown · non-contrast
Comparison: CT abdomen pelvis 07/12/2018

CLINICAL DATA: Patient with hydronephrosis on recent CT

EXAM:
RENAL / URINARY TRACT ULTRASOUND COMPLETE

[Series 1: us renal · 0.24mm/px · 14 of 95 slices shown]
[im 1/95]
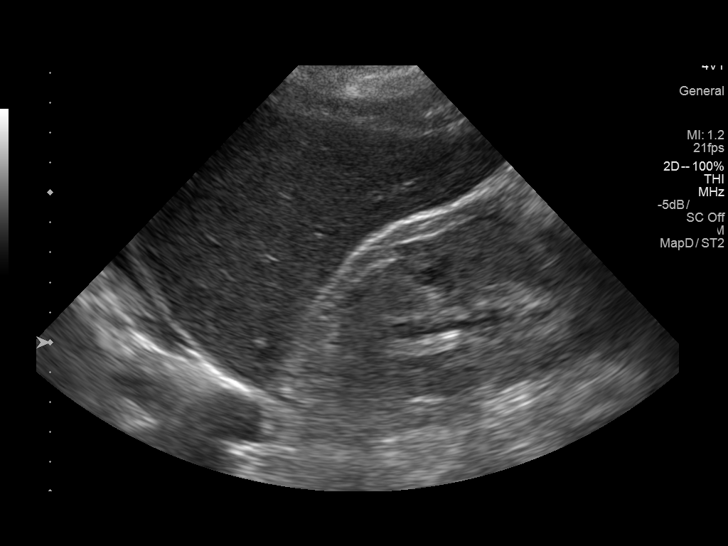
[im 8/95]
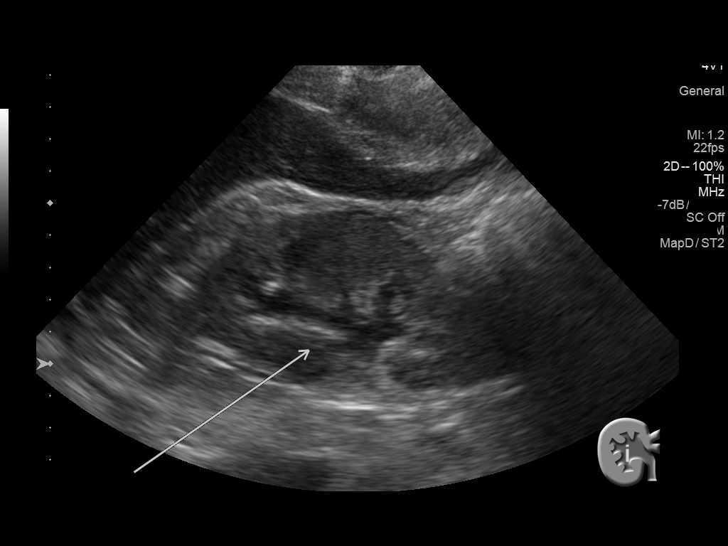
[im 16/95]
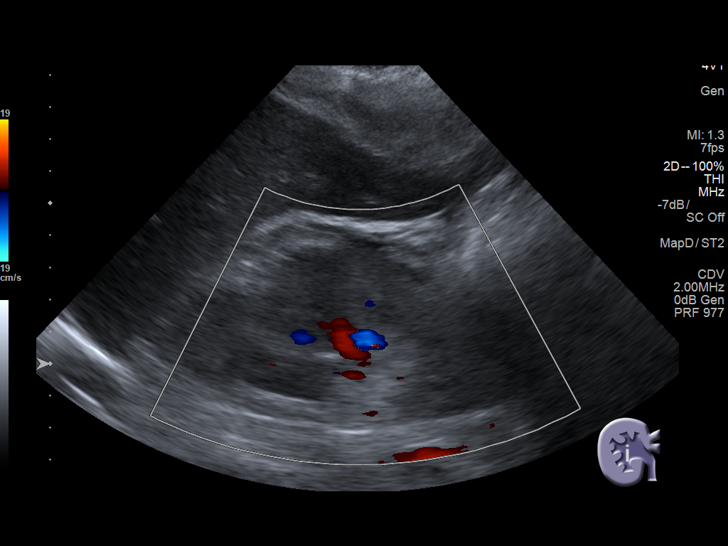
[im 24/95]
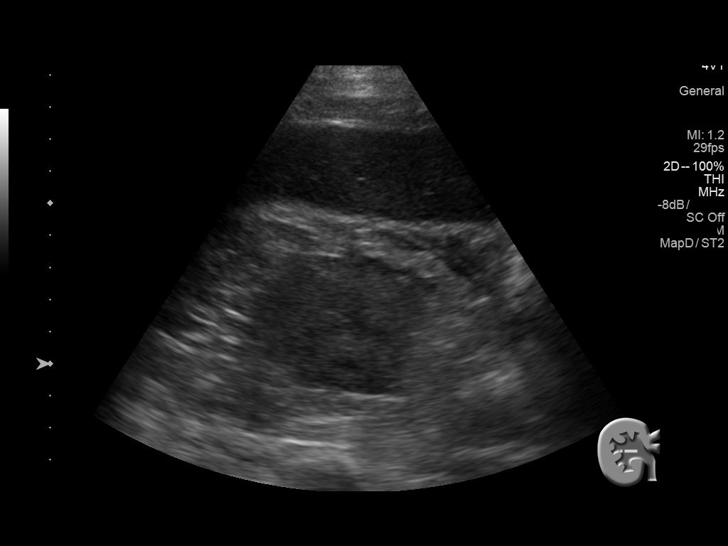
[im 32/95]
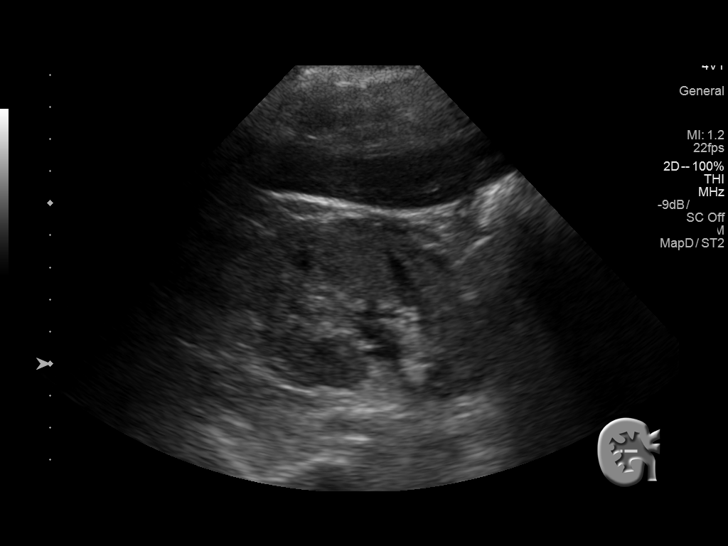
[im 36/95]
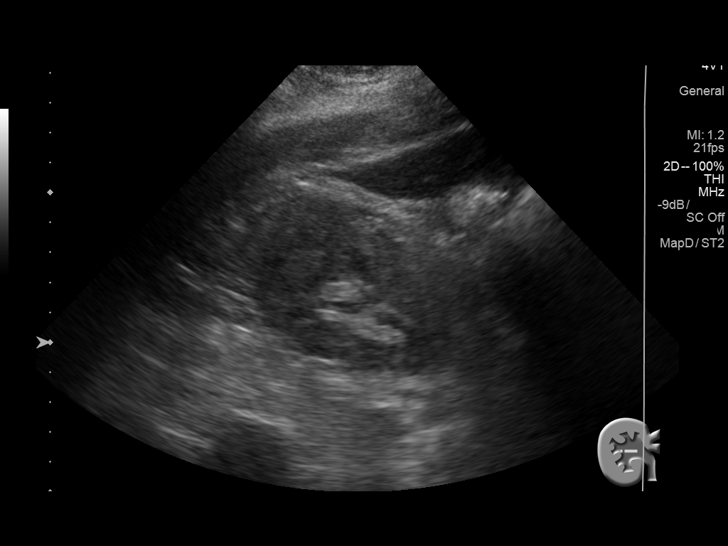
[im 44/95]
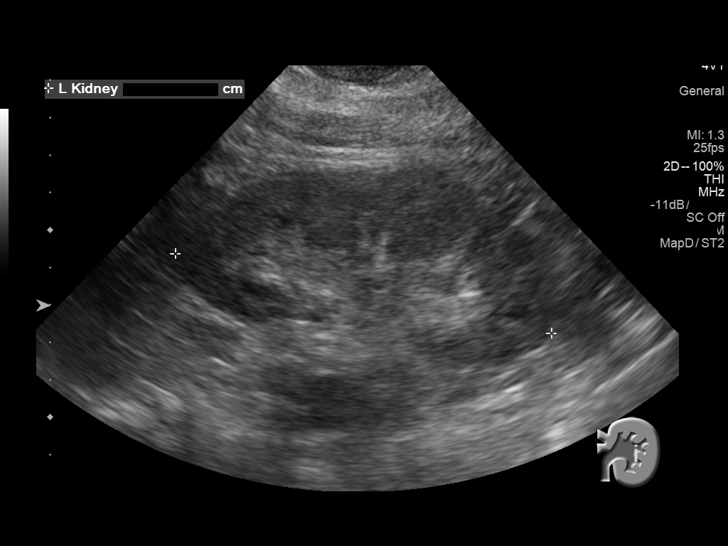
[im 51/95]
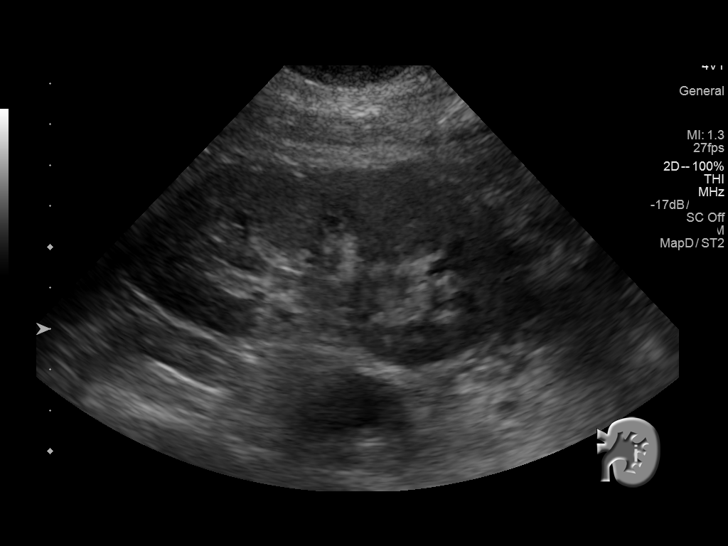
[im 59/95]
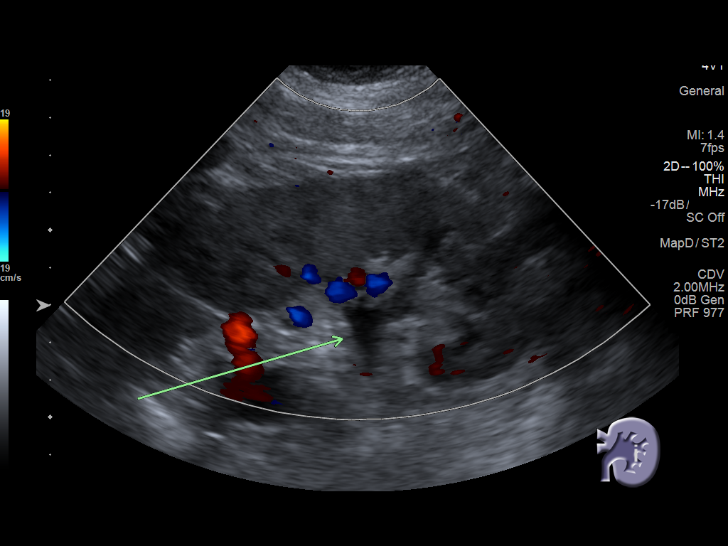
[im 63/95]
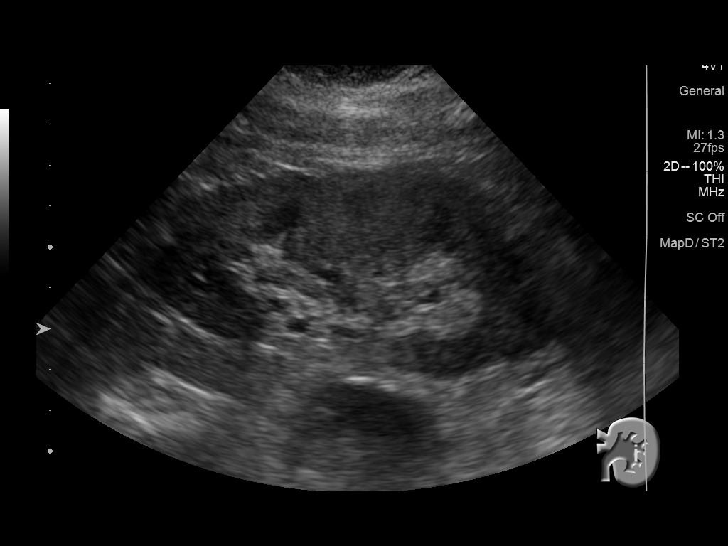
[im 71/95]
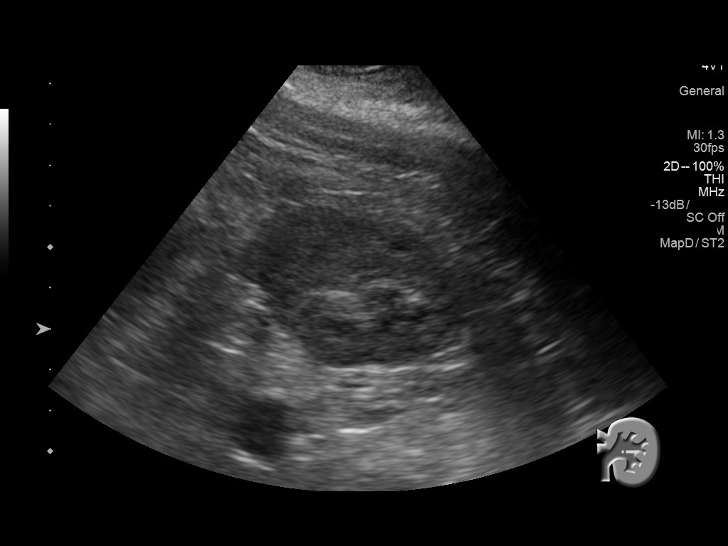
[im 79/95]
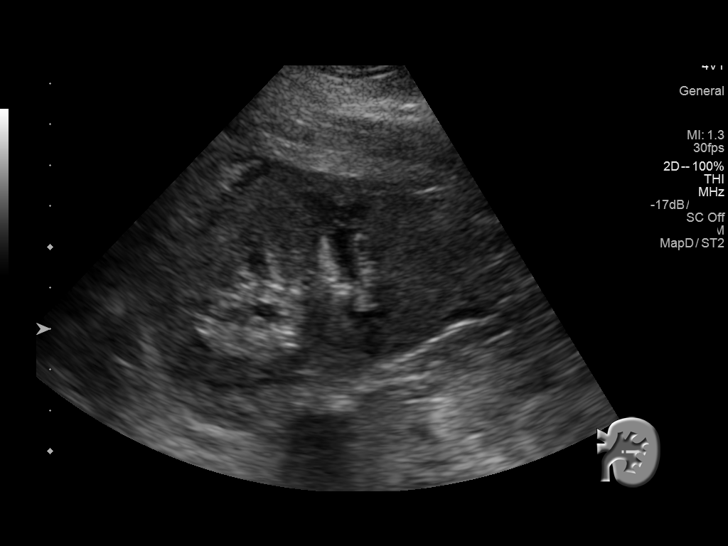
[im 87/95]
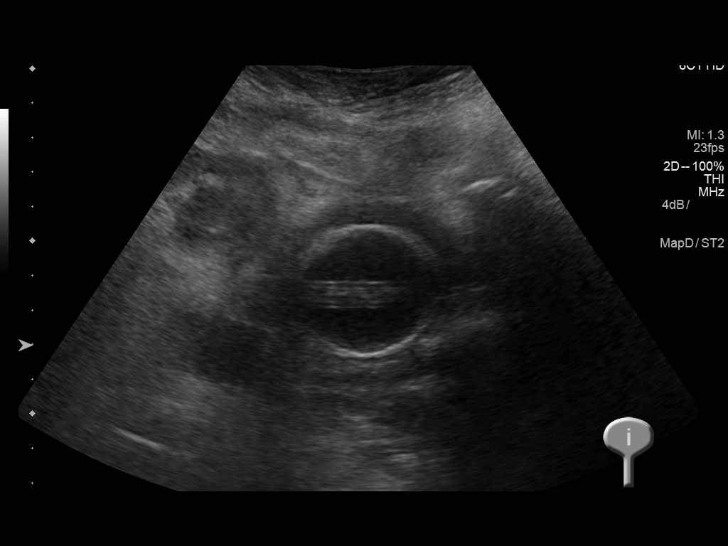
[im 95/95]
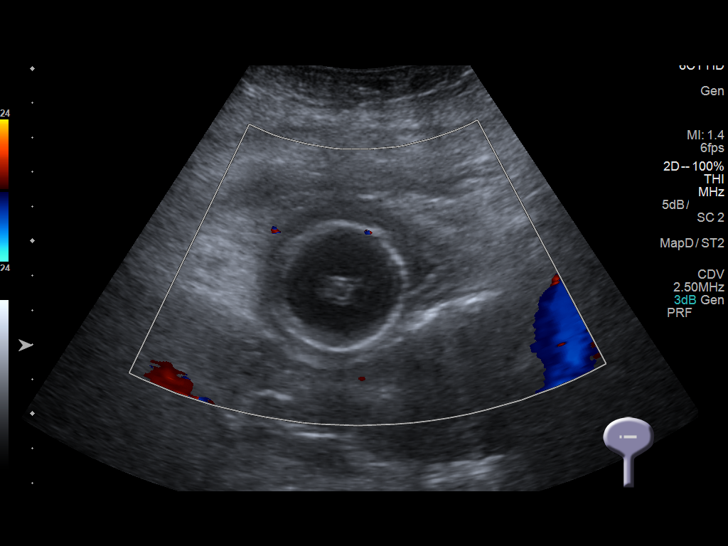

[14 of 25 positions shown; findings below may reference images not displayed]

FINDINGS: Right Kidney:

Length: 10.5 cm. Normal renal cortical thickness and echogenicity.
Mild hydronephrosis. Small amount of fluid about the right kidney.

Left Kidney:

Length: 10.3 cm. Normal renal cortical thickness and echogenicity.
Mild pelviectasis.

Bladder:

Decompressed with Foley catheter
IMPRESSION: Mild right hydronephrosis and left pelviectasis.

## 2019-10-04 ENCOUNTER — Ambulatory Visit: Payer: Medicare Other | Admitting: Family Medicine

## 2019-10-13 DIAGNOSIS — I509 Heart failure, unspecified: Secondary | ICD-10-CM | POA: Diagnosis not present

## 2019-10-13 DIAGNOSIS — R062 Wheezing: Secondary | ICD-10-CM | POA: Diagnosis not present

## 2019-10-17 ENCOUNTER — Other Ambulatory Visit: Payer: Self-pay | Admitting: Family Medicine

## 2019-10-17 ENCOUNTER — Other Ambulatory Visit (INDEPENDENT_AMBULATORY_CARE_PROVIDER_SITE_OTHER): Payer: Self-pay | Admitting: Orthopedic Surgery

## 2019-10-17 DIAGNOSIS — I10 Essential (primary) hypertension: Secondary | ICD-10-CM

## 2019-10-24 ENCOUNTER — Other Ambulatory Visit: Payer: Self-pay

## 2019-11-13 DIAGNOSIS — I509 Heart failure, unspecified: Secondary | ICD-10-CM | POA: Diagnosis not present

## 2019-11-13 DIAGNOSIS — R062 Wheezing: Secondary | ICD-10-CM | POA: Diagnosis not present

## 2019-11-14 ENCOUNTER — Other Ambulatory Visit (INDEPENDENT_AMBULATORY_CARE_PROVIDER_SITE_OTHER): Payer: Self-pay | Admitting: Orthopedic Surgery

## 2019-11-14 ENCOUNTER — Other Ambulatory Visit: Payer: Self-pay | Admitting: Family Medicine

## 2019-11-15 ENCOUNTER — Other Ambulatory Visit: Payer: Self-pay | Admitting: Family Medicine

## 2019-12-11 ENCOUNTER — Telehealth: Payer: Self-pay

## 2019-12-11 NOTE — Telephone Encounter (Signed)

## 2019-12-12 ENCOUNTER — Other Ambulatory Visit (INDEPENDENT_AMBULATORY_CARE_PROVIDER_SITE_OTHER): Payer: Self-pay | Admitting: Orthopedic Surgery

## 2019-12-12 ENCOUNTER — Other Ambulatory Visit: Payer: Self-pay | Admitting: Family Medicine

## 2019-12-14 DIAGNOSIS — I509 Heart failure, unspecified: Secondary | ICD-10-CM | POA: Diagnosis not present

## 2019-12-14 DIAGNOSIS — R062 Wheezing: Secondary | ICD-10-CM | POA: Diagnosis not present

## 2019-12-15 ENCOUNTER — Encounter: Payer: Self-pay | Admitting: Cardiovascular Disease

## 2019-12-15 ENCOUNTER — Telehealth (INDEPENDENT_AMBULATORY_CARE_PROVIDER_SITE_OTHER): Payer: Medicare Other | Admitting: Cardiovascular Disease

## 2019-12-15 VITALS — Ht 61.0 in

## 2019-12-15 DIAGNOSIS — F32A Depression, unspecified: Secondary | ICD-10-CM

## 2019-12-15 DIAGNOSIS — N183 Chronic kidney disease, stage 3 unspecified: Secondary | ICD-10-CM

## 2019-12-15 DIAGNOSIS — F329 Major depressive disorder, single episode, unspecified: Secondary | ICD-10-CM

## 2019-12-15 DIAGNOSIS — I5042 Chronic combined systolic (congestive) and diastolic (congestive) heart failure: Secondary | ICD-10-CM

## 2019-12-15 DIAGNOSIS — I480 Paroxysmal atrial fibrillation: Secondary | ICD-10-CM

## 2019-12-15 DIAGNOSIS — Z8673 Personal history of transient ischemic attack (TIA), and cerebral infarction without residual deficits: Secondary | ICD-10-CM

## 2019-12-15 DIAGNOSIS — I43 Cardiomyopathy in diseases classified elsewhere: Secondary | ICD-10-CM

## 2019-12-15 DIAGNOSIS — I471 Supraventricular tachycardia: Secondary | ICD-10-CM

## 2019-12-15 NOTE — Patient Instructions (Signed)
Medication Instructions:   Your physician has recommended you make the following change in your medication:   Stop clopidogrel (plavix)  Continue other medications the same  Labwork:  NONE  Testing/Procedures:  NONE  Follow-Up:  Your physician recommends that you schedule a follow-up appointment in: 1 year (virtual) with an extender. You will receive a reminder letter in the mail in about 10 months reminding you to call and schedule your appointment. If you don't receive this letter, please contact our office.  Any Other Special Instructions Will Be Listed Below (If Applicable).  If you need a refill on your cardiac medications before your next appointment, please call your pharmacy.

## 2019-12-15 NOTE — Progress Notes (Signed)
Virtual Visit via Telephone Note   This visit type was conducted due to national recommendations for restrictions regarding the COVID-19 Pandemic (e.g. social distancing) in an effort to limit this patient's exposure and mitigate transmission in our community.  Due to her co-morbid illnesses, this patient is at least at moderate risk for complications without adequate follow up.  This format is felt to be most appropriate for this patient at this time.  The patient did not have access to video technology/had technical difficulties with video requiring transitioning to audio format only (telephone).  All issues noted in this document were discussed and addressed.  No physical exam could be performed with this format.  Please refer to the patient's chart for her  consent to telehealth for [facility].   Date:  12/15/2019   ID:  [name], DOB May 07, 1966, MRN [id number]  Patient Location: Home Provider Location: Home  PCP:  [name], MD  Cardiologist:  Kate Sable, MD  Electrophysiologist:  None   Evaluation Performed:  Follow-Up Visit  Chief Complaint:  Atrial fibrillation  History of Present Illness:    [name] is a 54 y.o. female with a history of paroxysmal atrial fibrillation and atrial tachycardia.  She also has a nonischemic cardiomyopathy.  I last saw her while she was hospitalized in August 2019.  She neither followed up with general cardiology nor EP.  She underwent a colonoscopy in July 2020.  A single polyp at the ileocecal valve was removed.  There were internal and external hemorrhoids.  The patient denies any symptoms of chest pain, palpitations, shortness of breath, lightheadedness, dizziness, leg swelling, orthopnea, PND, and syncope.  The patient does not have symptoms concerning for COVID-19 infection (fever, chills, cough, or new shortness of breath).   She says she feels depressed because she can't walk.   Past Medical History:   Diagnosis Date  . Alcohol use   . Ankle fracture, lateral malleolus, closed 12/30/2011  . Anxiety   . Breast mass, left 12/15/2011  . Chronic anemia   . Chronic combined systolic and diastolic CHF (congestive heart failure) ([place])   . CKD (chronic kidney disease), stage III    pt denies  . Cocaine abuse ([place])   . COPD (chronic obstructive pulmonary disease) ([place])   . Diabetes mellitus, type 2 ([place])   . Diabetic Charcot foot ([place]) 12/30/2011  . Essential hypertension   . History of GI bleed 12/05/2009   Qualifier: Diagnosis of  By: Zeb Comfort    . Hyperlipidemia   . Hypertensive cardiomyopathy ([place]) 11/08/2012   a. 05/2016: echo showing EF of 20-25% with normal cors by cath in 07/2016.  . Morbid obesity ([place]) 01/31/2008   Qualifier: Diagnosis of  By: Lenn Cal    . Noncompliance with medication regimen   . Obesity   . Panic attacks   . PAT (paroxysmal atrial tachycardia) ([place])    a. started on amiodarone 07/2017 for this.  . Sleep apnea    does not use cpap  . Stroke ([place])    06/24/17  . Tobacco abuse   . Urinary incontinence    Past Surgical History:  Procedure Laterality Date  . BREAST BIOPSY    . CARDIAC CATHETERIZATION N/A 07/28/2016   Procedure: Left Heart Cath and Coronary Angiography;  Surgeon: Jettie Booze, MD;  Location: [place] [facility];  Service: Cardiovascular;  Laterality: N/A;  . COLONOSCOPY N/A 05/10/2019   Procedure: COLONOSCOPY;  Surgeon: Danie Binder,  MD;  [facility];  Service: Endoscopy;  Laterality: N/A;  Phenergan 12.5 mg IV in pre-op  . DILATION AND CURETTAGE OF UTERUS    . I & D EXTREMITY Bilateral 09/22/2017   Procedure: BILATERAL DEBRIDEMENT LEG/FOOT ULCERS, APPLY VERAFLO WOUND VAC;  Surgeon: Newt Minion, MD;  Location: [place];  Service: Orthopedics;  Laterality: Bilateral;  . I & D EXTREMITY Right 10/11/2018   Procedure: IRRIGATION AND DEBRIDEMENT RIGHT HAND;  Surgeon: [name], MD;  Location: [place];  Service:  Orthopedics;  Laterality: Right;  . I & D EXTREMITY Right 10/13/2018   Procedure: REPEAT IRRIGATION AND DEBRIDEMENT RIGHT HAND;  Surgeon: [name], MD;  Location: [place];  Service: Orthopedics;  Laterality: Right;  . I & D EXTREMITY Right 11/22/2018   Procedure: IRRIGATION AND DEBRIDEMENT AND PINNING RIGHT HAND;  Surgeon: [name], MD;  Location: [place];  Service: Orthopedics;  Laterality: Right;  . IR RADIOLOGIST EVAL & MGMT  07/05/2018  . POLYPECTOMY  05/10/2019   Procedure: POLYPECTOMY;  Surgeon: Danie Binder, MD;  [facility];  Service: Endoscopy;;  . SKIN SPLIT GRAFT Bilateral 09/28/2017   Procedure: BILATERAL SPLIT THICKNESS SKIN GRAFT LEGS/FEET AND APPLY VAC;  Surgeon: Newt Minion, MD;  Location: [place];  Service: Orthopedics;  Laterality: Bilateral;  . SKIN SPLIT GRAFT Right 11/22/2018   Procedure: SKIN GRAFT SPLIT THICKNESS;  Surgeon: [name], MD;  Location: [place];  Service: Orthopedics;  Laterality: Right;     Current Meds  Medication Sig  . albuterol (PROAIR HFA) 108 (90 Base) MCG/ACT inhaler INHALE 2 PUFFS EVERY 6 HOURS AS NEEDED FOR SHORTNESS OF BREATH/WHEEZING.  Marland Kitchen amLODipine (NORVASC) 10 MG tablet TAKE 1 TABLET BY MOUTH ONCE A DAY.  Marland Kitchen apixaban (ELIQUIS) 5 MG TABS tablet Take 5 mg by mouth 2 (two) times daily.  . cloNIDine (CATAPRES) 0.3 MG tablet TAKE 1 TABLET BY MOUTH AT BEDTIME FOR BLOOD PRESSURE  . clopidogrel (PLAVIX) 75 MG tablet TAKE 1 TABLET BY MOUTH DAILY WITH BREAKFAST.  . DULoxetine (CYMBALTA) 30 MG capsule TAKE 2 CAPSULES BY MOUTH ONCE DAILY.  Marland Kitchen ezetimibe (ZETIA) 10 MG tablet TAKE 1 TABLET BY MOUTH ONCE A DAY.  . furosemide (LASIX) 20 MG tablet TAKE 1 TABLET BY MOUTH ONCE A DAY.  Marland Kitchen gabapentin (NEURONTIN) 300 MG capsule TAKE 1 CAPSULE BY MOUTH THREE TIMES A DAY.  . hydrOXYzine (ATARAX/VISTARIL) 25 MG tablet TAKE 1 TABLET BY MOUTH AT BEDTIME FOR ITCHING AND SLEEP.  Marland Kitchen metoprolol succinate (TOPROL-XL) 50 MG 24 hr tablet TAKE 1 TABLET BY  MOUTH TWICE A DAY.  . montelukast (SINGULAIR) 10 MG tablet TAKE (1) TABLET BY MOUTH AT BEDTIME.  Marland Kitchen olmesartan (BENICAR) 40 MG tablet TAKE 1 TABLET BY MOUTH ONCE A DAY.  . pantoprazole (PROTONIX) 40 MG tablet TAKE 1 TABLET BY MOUTH ONCE A DAY.  Marland Kitchen potassium chloride SA (KLOR-CON) 20 MEQ tablet TAKE 1&1/2 TABLET BY MOUTH DAILY.  . rosuvastatin (CRESTOR) 10 MG tablet TAKE 1 TABLET BY MOUTH ONCE A DAY.  . solifenacin (VESICARE) 5 MG tablet TAKE 1 TABLET BY MOUTH ONCE DAILY.  Marland Kitchen TRADJENTA 5 MG TABS tablet TAKE 1 TABLET BY MOUTH ONCE DAILY.  . traMADol (ULTRAM) 50 MG tablet Take by mouth every 8 (eight) hours as needed.  . traZODone (DESYREL) 50 MG tablet TAKE (1) TABLET BY MOUTH AT BEDTIME.     Allergies:   Patient has no known allergies.   Social History   Tobacco Use  .  Smoking status: Current Every Day Smoker    Packs/day: 1.00    Years: 20.00    Pack years: 20.00    Types: Cigarettes    Last attempt to quit: 06/03/2017    Years since quitting: 2.5  . Smokeless tobacco: Never Used  Substance Use Topics  . Alcohol use: No    Alcohol/week: 0.0 standard drinks    Comment: Quit in 2017  . Drug use: Not Currently    Frequency: 3.0 times per week    Types: Marijuana, Cocaine    Comment: none since August 2018     Family Hx: The patient's family history includes Arthritis in an other family member; Asthma in an other family member; Cancer in her sister and another family member; Diabetes in an other family member; Heart attack in her mother; Hypertension in her brother, daughter, father, mother, sister, sister, and son.  ROS:   Please see the history of present illness.     All other systems reviewed and are negative.   Prior CV studies:   The following studies were reviewed today:  Echocardiogram 06/22/2017:  - Left ventricle: The cavity size was normal. Wall thickness was increased in a pattern of moderate LVH. Systolic function was moderately to severely reduced. The  estimated ejection fraction was in the range of 30% to 35%. Diffuse hypokinesis. Features are consistent with a pseudonormal left ventricular filling pattern, with concomitant abnormal relaxation and increased filling pressure (grade 2 diastolic dysfunction). - Mitral valve: There was moderate regurgitation directed posteriorly. - Left atrium: The atrium was mildly dilated.  Event monitor 08/23/2017: Sinus rhythm with no arrhythmias  Labs/Other Tests and Data Reviewed:    EKG:  No ECG reviewed.  Recent Labs: 05/09/2019: ALT 12; BUN 24; Creatinine, Ser 1.30; Hemoglobin 14.0; Magnesium 1.8; Platelets 200; Potassium 4.1; Sodium 142; TSH 1.393   Recent Lipid Panel Lab Results  Component Value Date/Time   CHOL 144 12/07/2018 03:43 PM   TRIG 79 12/07/2018 03:43 PM   HDL 55 12/07/2018 03:43 PM   CHOLHDL 2.6 12/07/2018 03:43 PM   LDLCALC 73 12/07/2018 03:43 PM   LDLDIRECT 122 (H) 01/08/2010 08:18 AM    Wt Readings from Last 3 Encounters:  07/10/19 182 lb (82.6 kg)  05/24/19 186 lb 0.6 oz (84.4 kg)  05/10/19 186 lb 11.7 oz (84.7 kg)     Objective:    Vital Signs:  Ht 5\' 1"  (1.549 m)   LMP 05/19/2016   BMI 34.39 kg/m    VITAL SIGNS:  reviewed  ASSESSMENT & PLAN:    1.  Paroxysmal atrial fibrillation/atrial tachycardia: Symptomatically stable.  She is on Toprol-XL. Anticoagulated with Eliquis. Hemoglobin and platelets normal on 05/09/2019.  2.  Nonischemic cardiomyopathy/chronic systolic heart failure: Currently on Toprol-XL and Benicar.  No symptoms suggestive of hypervolemia.  Currently on Lasix 20 mg daily.  3.  Chronic kidney disease stage III: Creatinine 1.3 on 05/09/2019.   4. History of CVA: Currently on clopidogrel and apixaban. I previously stopped clopidogrel. I will stop it. Also on statin therapy.  5. Depression: She told me she was told to see behavioral health by her PCP. She asked if I would prescribe a medication but I encouraged her to speak with  her PCP or behavioral health. She felt better on Xanax.   COVID-19 Education: The signs and symptoms of COVID-19 were discussed with the patient and how to seek care for testing (follow up with PCP or arrange E-visit).  The importance of social distancing  was discussed today.  Time:   Today, I have spent 10 minutes with the patient with telehealth technology discussing the above problems.     Medication Adjustments/Labs and Tests Ordered: Current medicines are reviewed at length with the patient today.  Concerns regarding medicines are outlined above.   Tests Ordered: No orders of the defined types were placed in this encounter.   Medication Changes: No orders of the defined types were placed in this encounter.   Follow Up:  Virtual Visit  in 1 year(s)  Signed, Kate Sable, MD  12/15/2019 10:42 AM    [place]

## 2019-12-15 NOTE — Addendum Note (Signed)
Addended by: Merlene Laughter on: 12/15/2019 10:59 AM   Modules accepted: Orders

## 2019-12-27 IMAGING — CR DG CHEST 1V PORT
1 series · 1 of 1 positions shown · non-contrast
Comparison: 06/04/2018

CLINICAL DATA: Shortness of breath, generalized weakness

EXAM:
PORTABLE CHEST 1 VIEW

[portable]
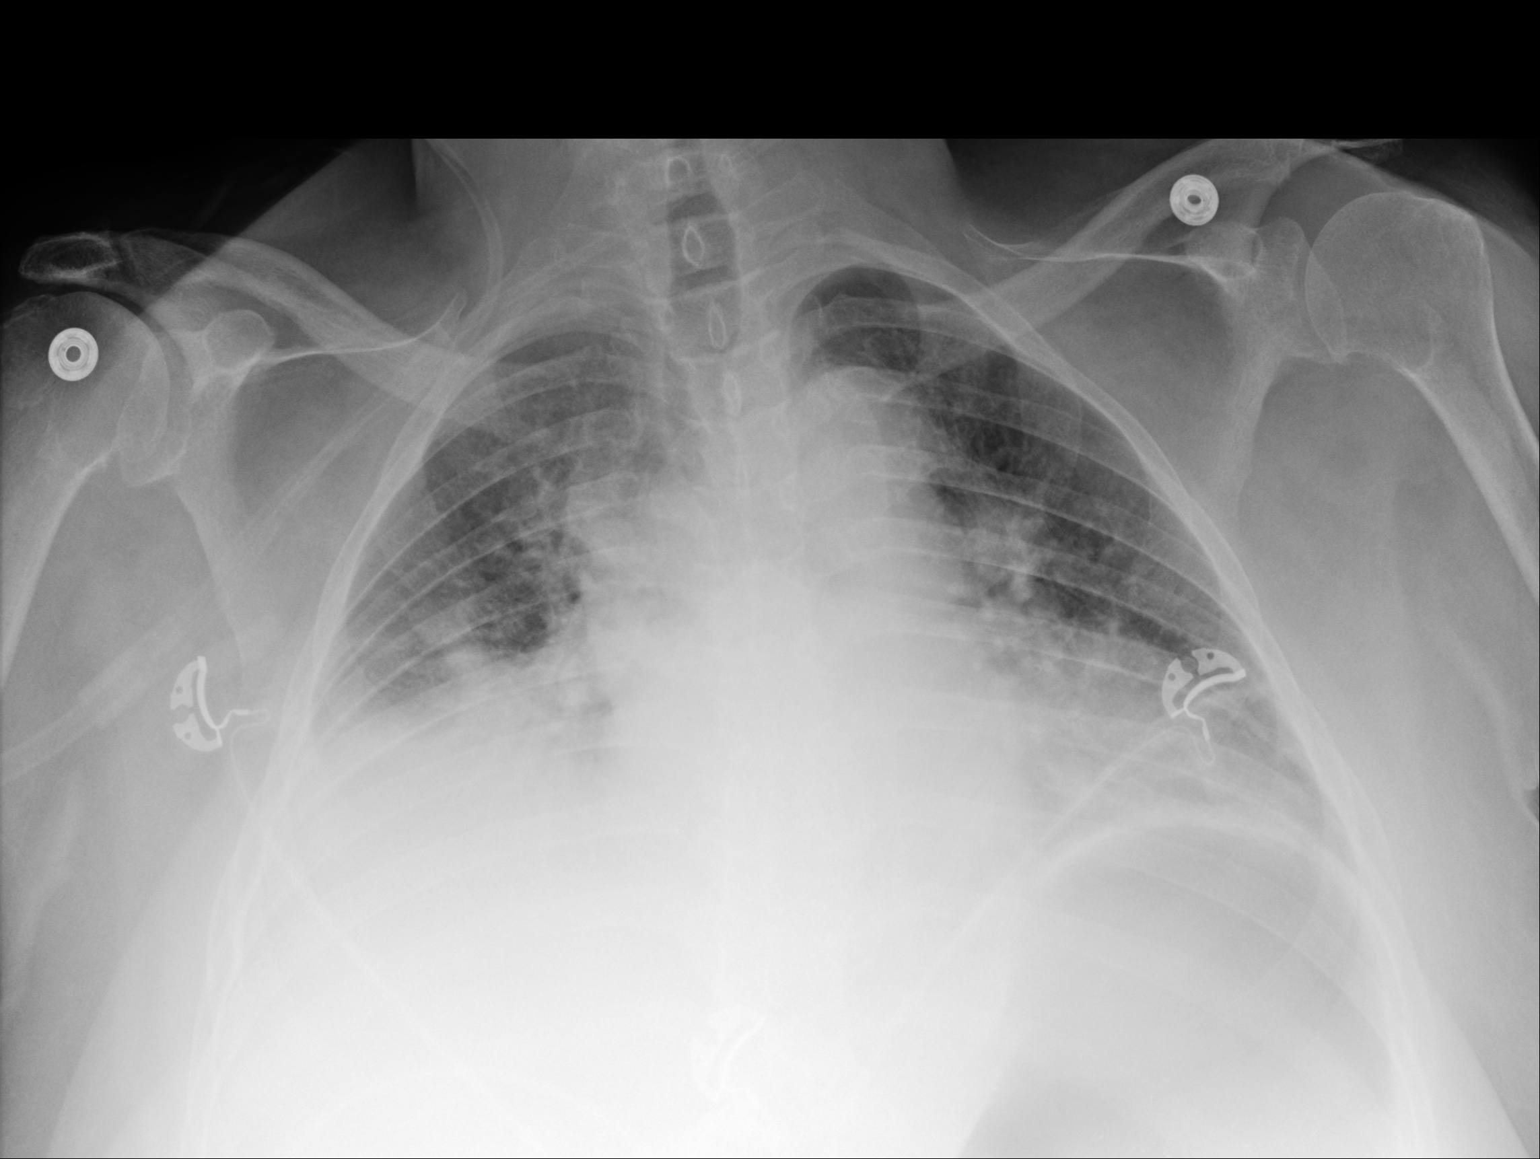

[1 of 1 positions shown; findings below may reference images not displayed]

FINDINGS: Low lung volumes.

Cardiomegaly with pulmonary vascular congestion and possible mild
perihilar edema. Mild patchy bilateral lower lobe opacities, likely
atelectasis. No definite pleural effusions. No pneumothorax.
IMPRESSION: Cardiomegaly with possible mild perihilar edema.

Mild patchy bilateral lower lobe opacities, likely atelectasis.

## 2019-12-31 IMAGING — CR DG CHEST 1V PORT
1 series · 1 of 1 positions shown · non-contrast
Comparison: 06/05/2018, 06/04/2018, 06/30/2017

CLINICAL DATA: Leukocytosis

EXAM:
PORTABLE CHEST 1 VIEW

[portable]
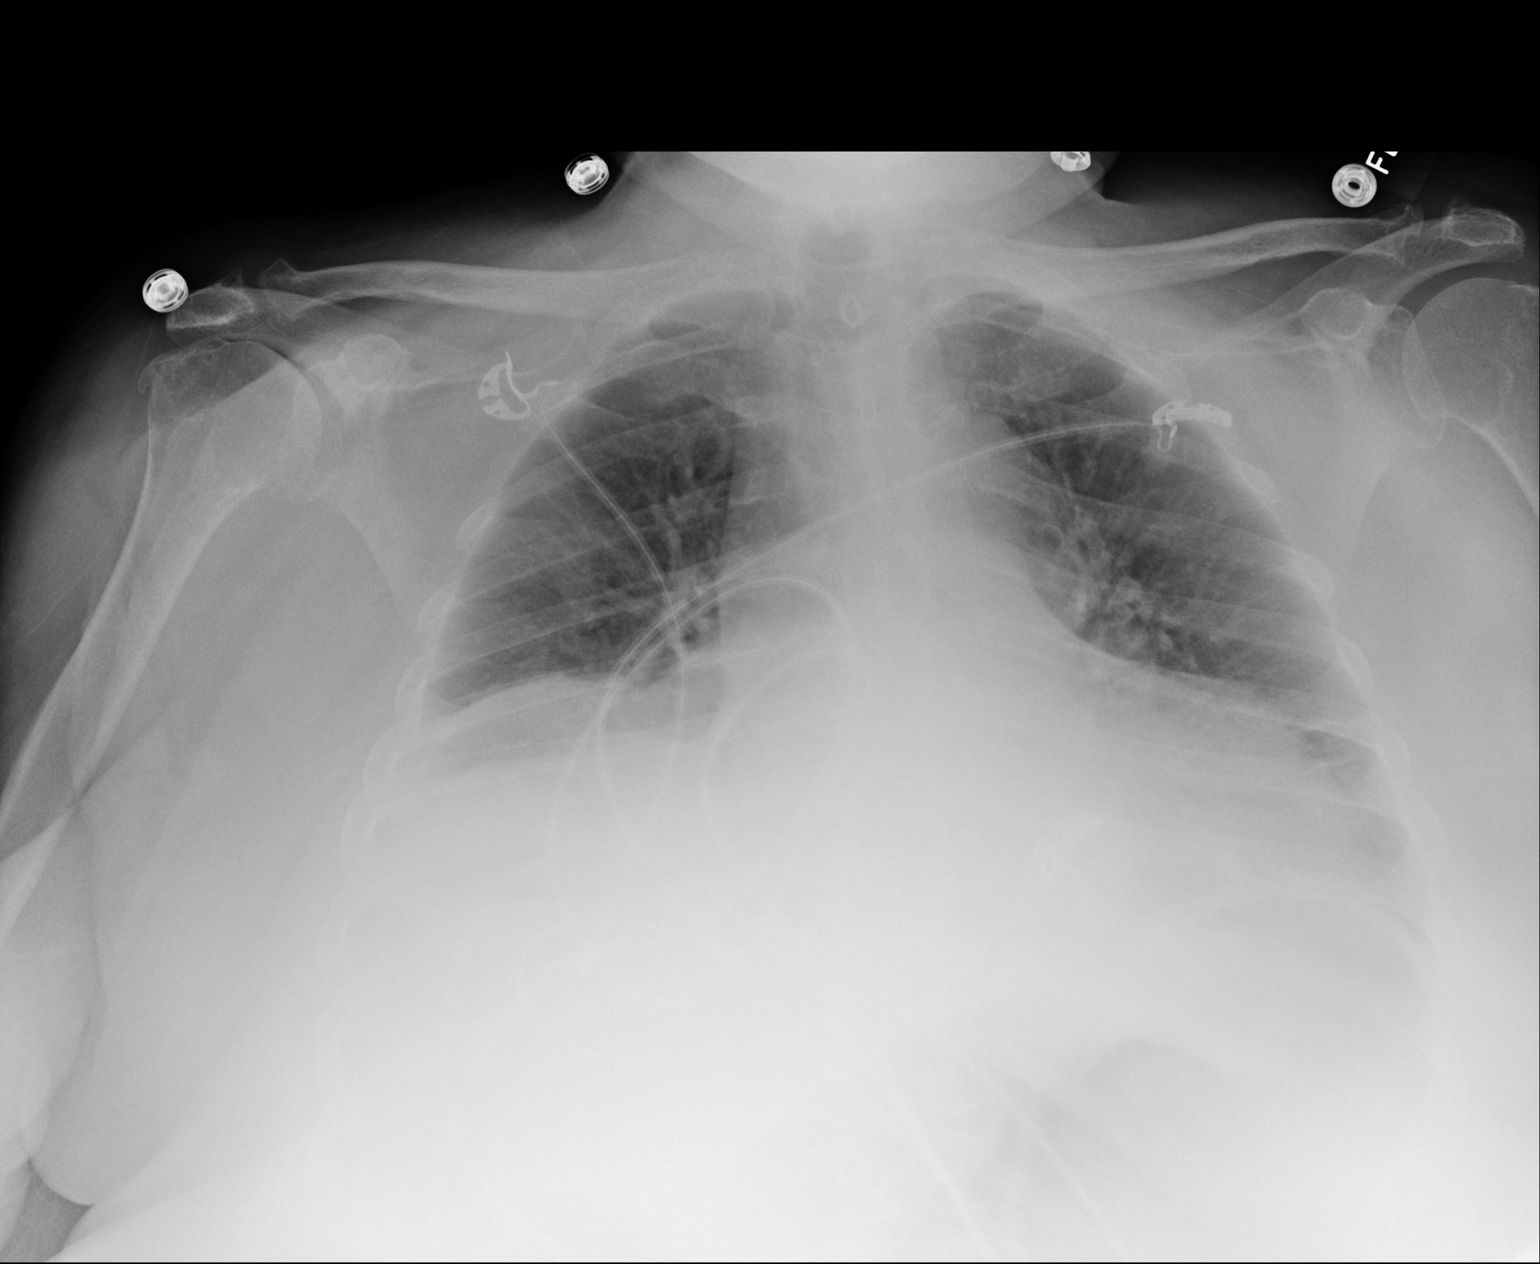

[1 of 1 positions shown; findings below may reference images not displayed]

FINDINGS: Low lung volumes. Slightly improved aeration of the bases with mild
residual airspace disease. Stable cardiomegaly. Decreased central
vascular congestion. No pneumothorax.
IMPRESSION: 1. Cardiomegaly with improved vascular congestion
2. Improved aeration at the bilateral lung bases with minimal
residual airspace disease, likely atelectasis.
3. No new focal pulmonary opacity.

## 2020-01-01 IMAGING — CT CT ABD-PELV W/O CM
2 of 4 series · 15 of 46 positions shown, 17 images · non-contrast
Comparison: Ultrasound dated 06/05/2018. MRI dated 05/21/2005.

CLINICAL DATA: Leukocytosis, urinary tract infection. Abdominal
pain.

EXAM:
CT ABDOMEN AND PELVIS WITHOUT CONTRAST
TECHNIQUE: Multidetector CT imaging of the abdomen and pelvis was performed
following the standard protocol without IV contrast.

[Series 2: axial st · axial · 0.81mm/px · z∈[+819,+1254]mm · 12 of 95 slices shown, 14 images]
[im 4/95  soft-tissue]
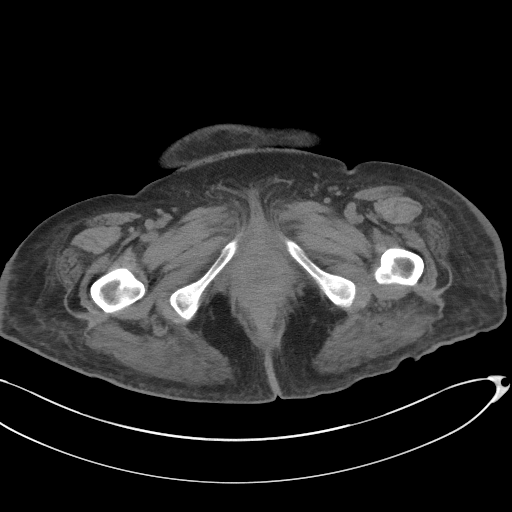
[im 4/95  bone]
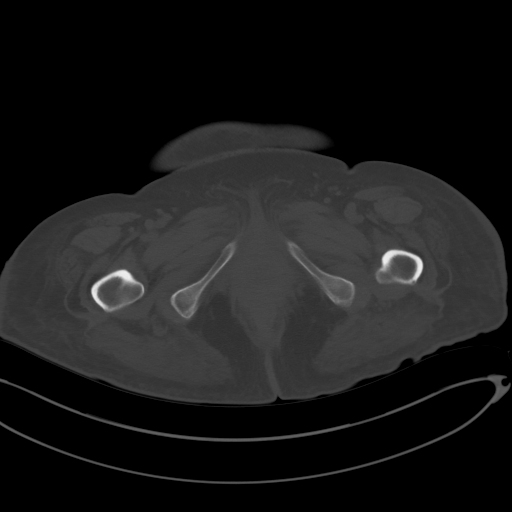
[im 12/95  soft-tissue]
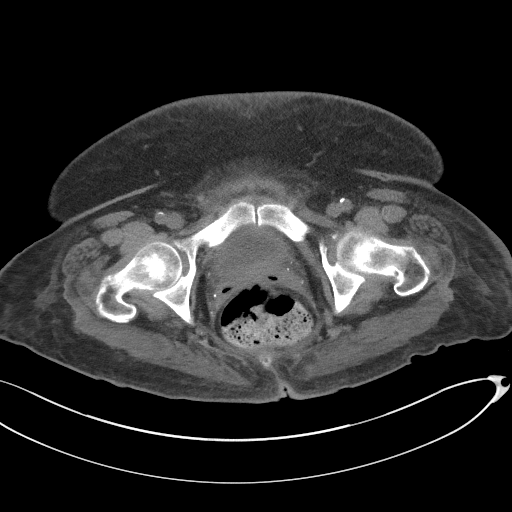
[im 20/95  soft-tissue]
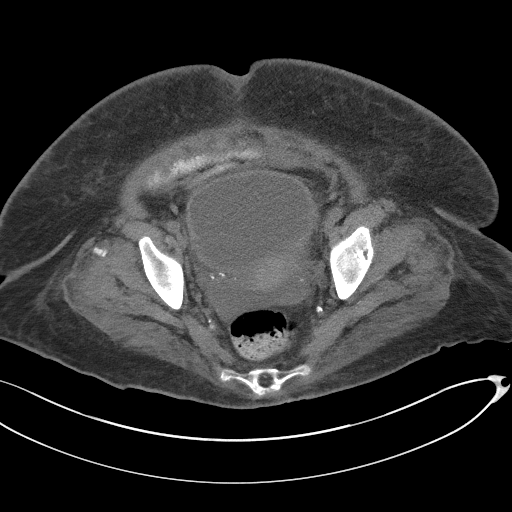
[im 28/95  soft-tissue]
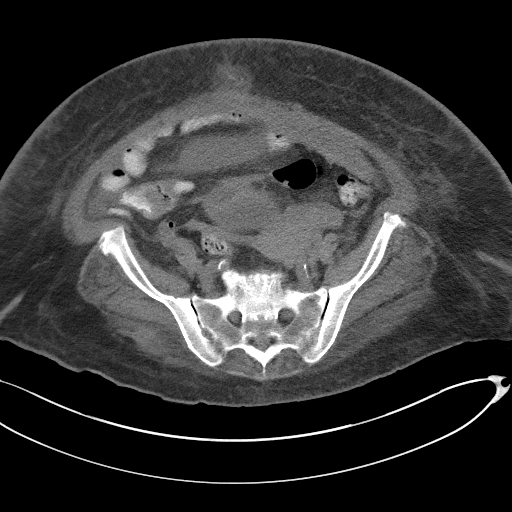
[im 36/95  soft-tissue]
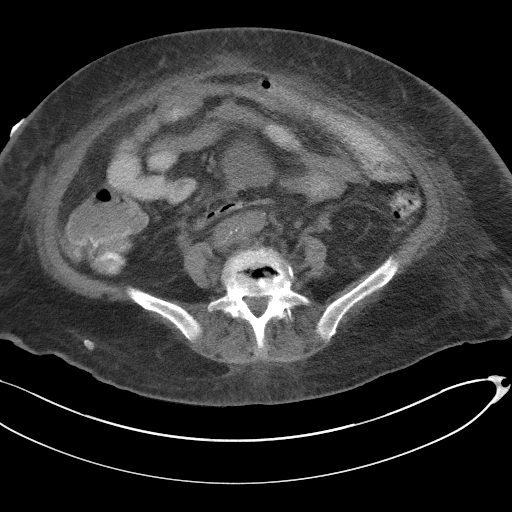
[im 44/95  soft-tissue]
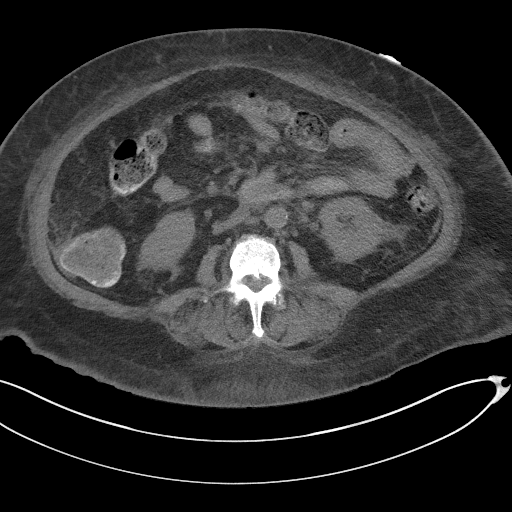
[im 51/95  soft-tissue]
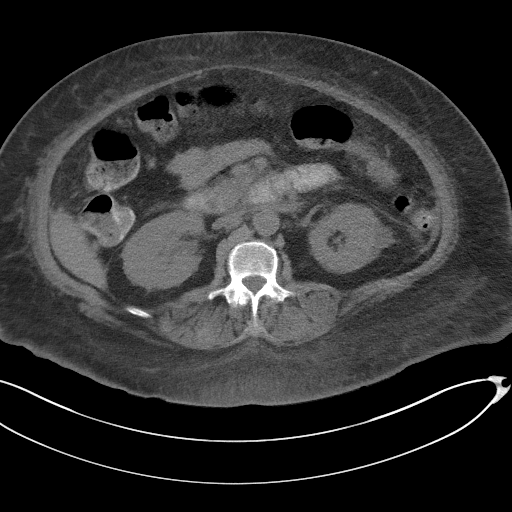
[im 59/95  soft-tissue]
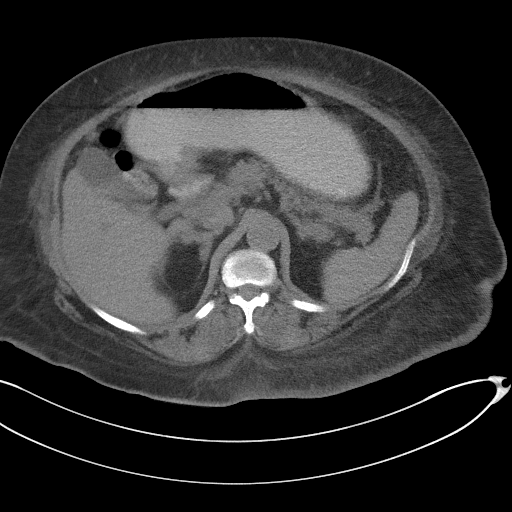
[im 67/95  soft-tissue]
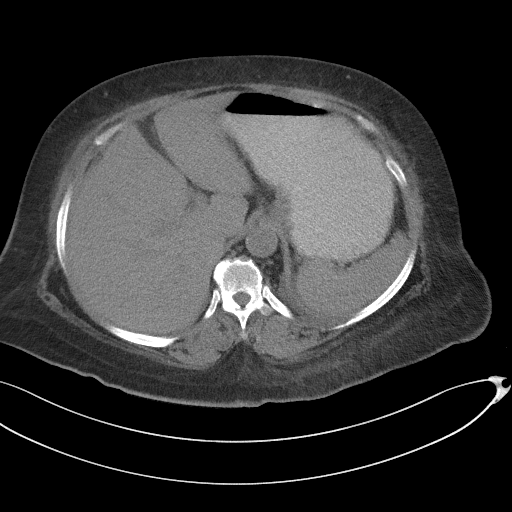
[im 67/95  bone]
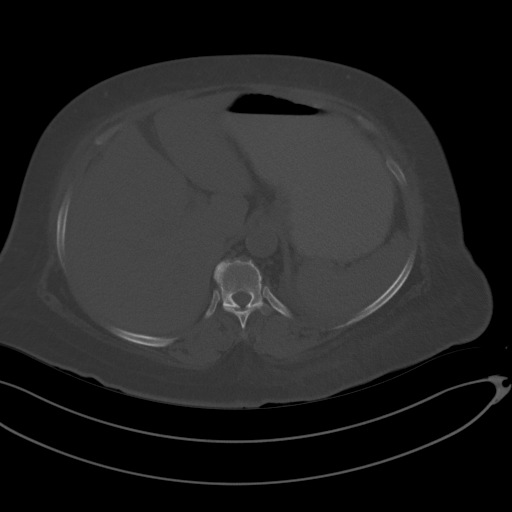
[im 75/95  soft-tissue]
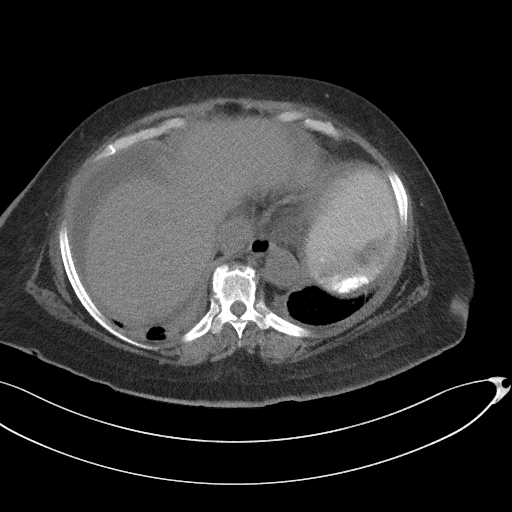
[im 83/95  soft-tissue]
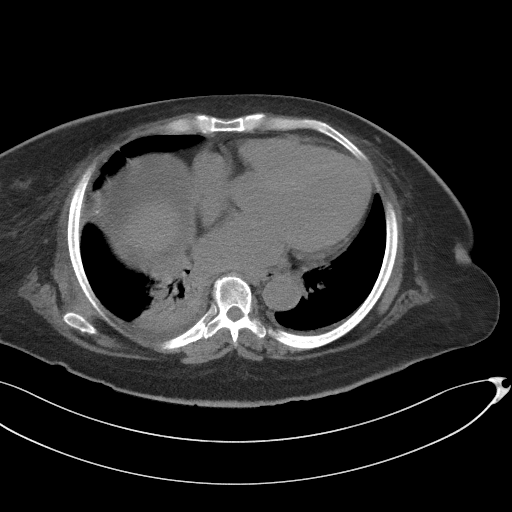
[im 91/95  soft-tissue]
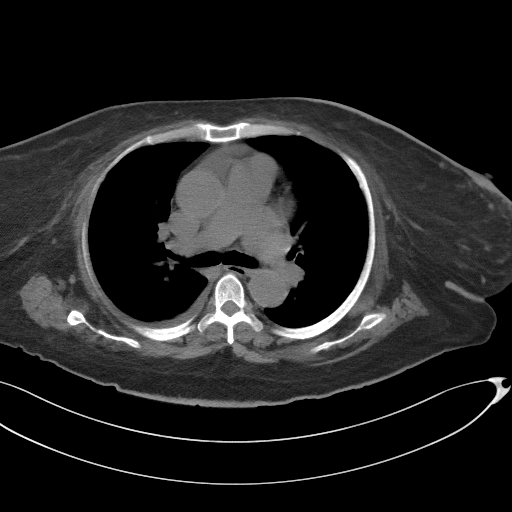

[Series 5: coronal st · coronal · 0.92mm/px · 3 of 98 slices shown]
[im 33/98  soft-tissue]
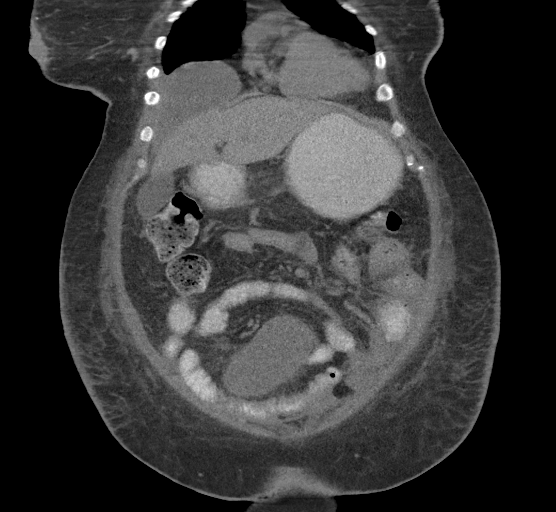
[im 44/98  soft-tissue]
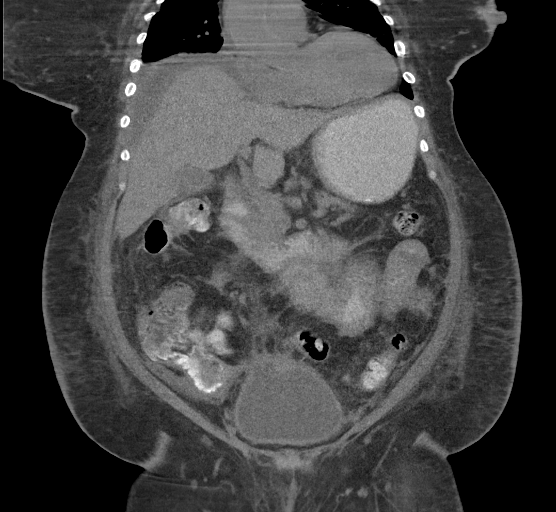
[im 54/98  soft-tissue]
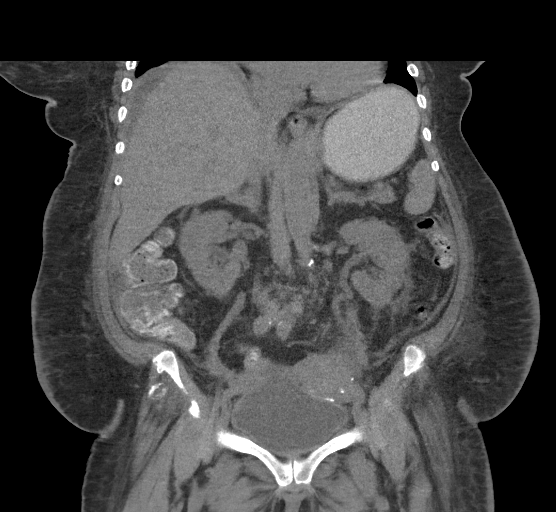

[15 of 46 positions shown; findings below may reference images not displayed]

FINDINGS: Lower chest: Atelectasis of the right middle lobe and right lower
lobe with some patchy ground-glass opacities anteriorly in the right
upper lobe. No truncation of the tracheobronchial tree is
identified. Small right and trace left pleural effusions. Moderate
cardiomegaly.

Hepatobiliary: On the MRI from 05/21/2005 there was a oval-shaped
enhancing lesion in the posterior caudate lobe common not readily
apparent on today's noncontrast CT. Dependent density in the
gallbladder suggesting sludge or gallstones.

Pancreas: Unremarkable

Spleen: Unremarkable

Adrenals/Urinary Tract: Bilateral perirenal stranding left greater
than right. No appreciable urinary tract calculi. Wall thickening in
the urinary bladder with lobularity along the anterior upper margin
of the urinary bladder which is probably from a diverticulum. No gas
in the urinary tract identified.

Stomach/Bowel: Orally administered contrast medium extends through
to the rectum. No appreciable dilated bowel. Appendix normal.

Vascular/Lymphatic: Aortoiliac atherosclerotic vascular disease.
Small retroperitoneal lymph nodes are not pathologically enlarged by
size criteria.

Reproductive: Grossly unremarkable contour of the uterus and adnexa.

Other: Perihepatic ascites with a small amount of ascites in both
paracolic gutters and a small but abnormal amount of ascites in the
pelvis. In addition, there are 2 unusual and somewhat circumscribed
fluid collections along the pelvic mesentery, one measuring 10.0 by
4.6 by 5.8 cm (volume = 140 cm^3) extending down to the apparent
diverticulum along the dome of the urinary bladder, and another
measuring 2.4 by 2.3 by 2.8 cm (volume = 8.1 cm^3) eccentric to
the right. These are unusually contained which for free fluid, and
could reflect loculated fluid collections. No internal gas or
appreciable abnormal surrounding wall thickening to further suggest
abscesses.

Musculoskeletal: Subcutaneous and diffuse mesenteric edema
compatible with third spacing fluid.

Lumbar spondylosis and degenerative disc disease but with
impingement at L3-4, L4-5, and L5-S1. Grade 1 degenerative
anterolisthesis at L3-4.
IMPRESSION: 1. Wall thickening in the urinary bladder suggesting cystitis.
2. There appears to be a diverticulum along the roof of the urinary
bladder. There is also an adjacent 140 cubic cm loculated fluid
collection along the central mesentery adjacent to the urinary
bladder and a smaller loculated fluid collection eccentric to the
right in the pelvic mesentery. The loculated appearing nature of
these collections is of uncertain significance but infection is not
entirely excluded.
3. Third spacing of fluid with small pleural effusions, subcutaneous
edema, mesenteric edema, and ascites.
4. Atelectasis of the right middle lobe and right lower lobe without
obvious truncation of the tracheobronchial tree to further explain.
There is some patchy ground-glass opacities anteriorly in the right
upper lobe potentially from alveolitis.
5. Moderate cardiomegaly.
6. Sludge versus stones in the gallbladder.
7.  Aortic Atherosclerosis ([id number]).
8. Multilevel lower lumbar impingement.
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
## 2020-01-04 IMAGING — CT CT ABD-PELV W/ CM
2 of 5 series · 15 of 46 positions shown, 17 images · IV contrast (Isovue)
Comparison: 06/10/2018

CLINICAL DATA: Abdominal pain, weakness, leukocytosis, UTI

EXAM:
CT ABDOMEN AND PELVIS WITH CONTRAST
TECHNIQUE: Multidetector CT imaging of the abdomen and pelvis was performed
using the standard protocol following bolus administration of
intravenous contrast.
CONTRAST:  100mL [id number] IOPAMIDOL ([id number]) INJECTION 61%

[Series 2: axial st · axial · 0.86mm/px · z∈[+901,+1276]mm · 12 of 87 slices shown, 14 images]
[im 6/87  soft-tissue]
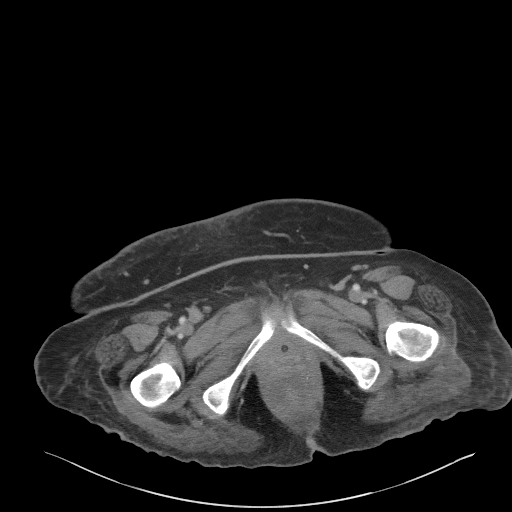
[im 6/87  bone]
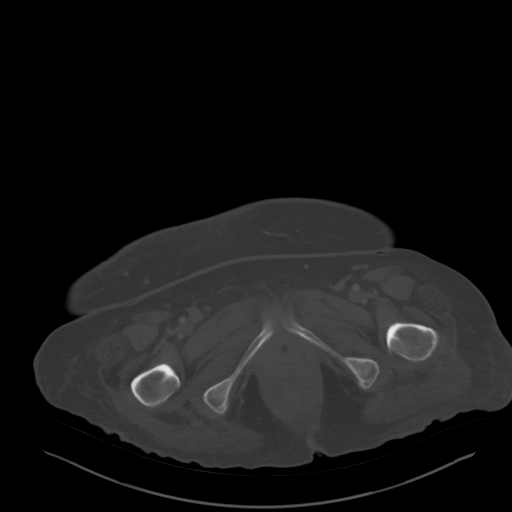
[im 11/87  soft-tissue]
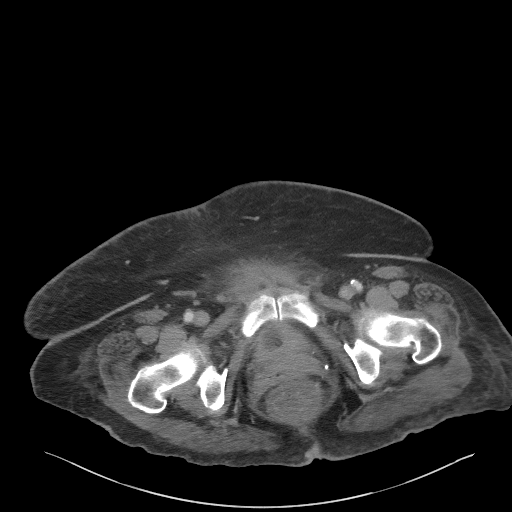
[im 22/87  soft-tissue]
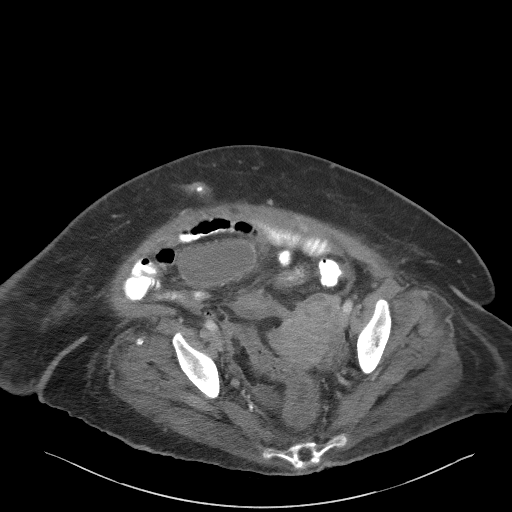
[im 27/87  soft-tissue]
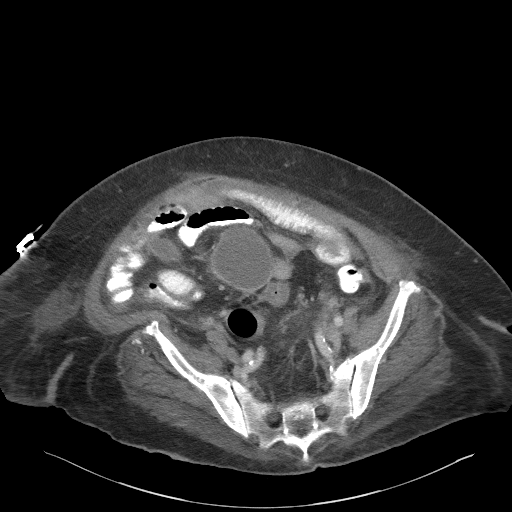
[im 33/87  soft-tissue]
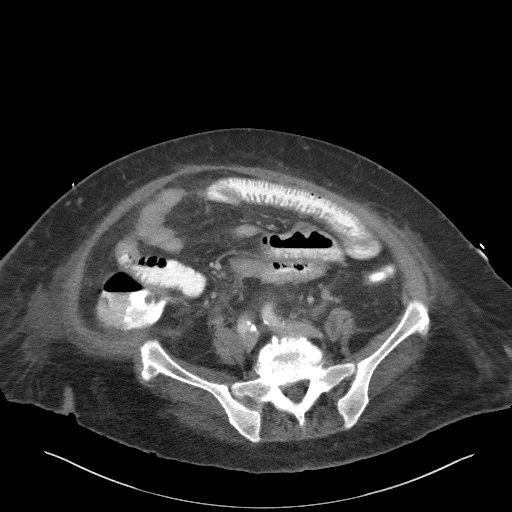
[im 38/87  soft-tissue]
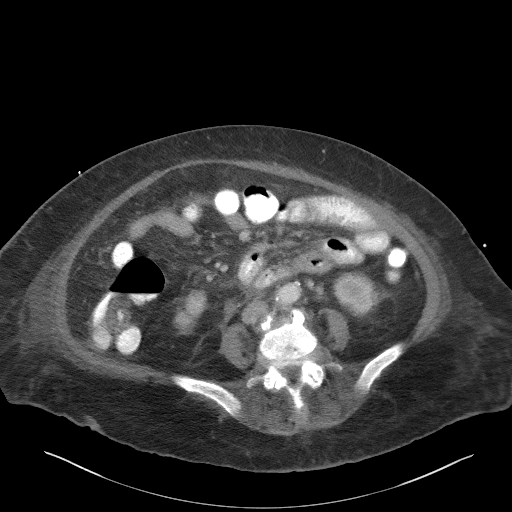
[im 49/87  soft-tissue]
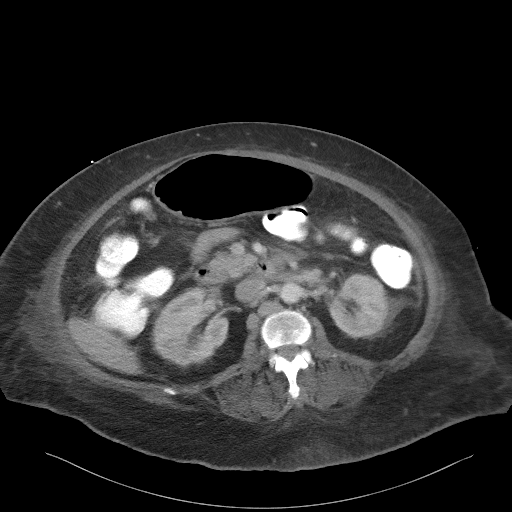
[im 54/87  soft-tissue]
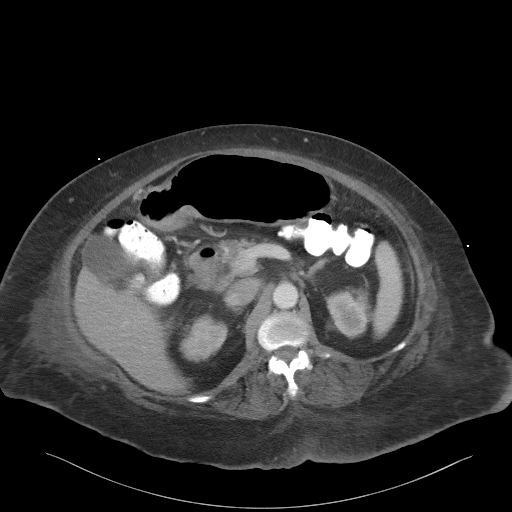
[im 60/87  soft-tissue]
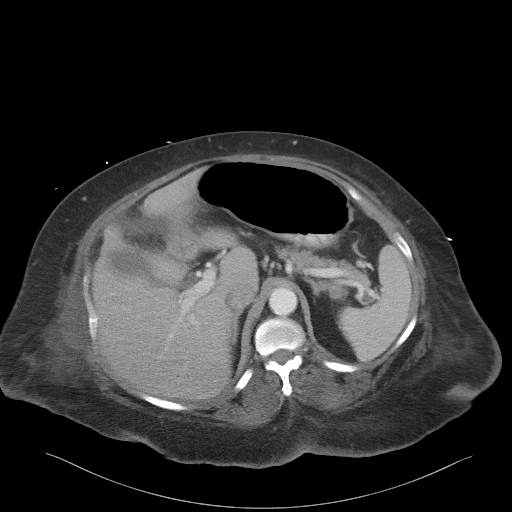
[im 60/87  bone]
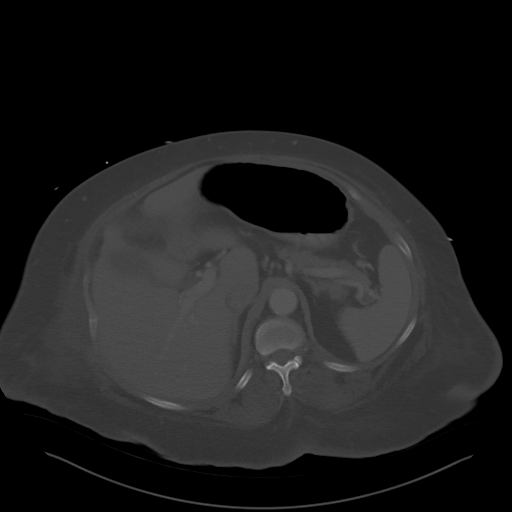
[im 65/87  soft-tissue]
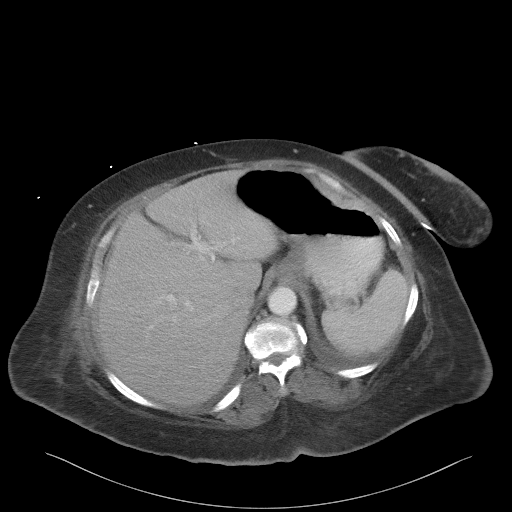
[im 76/87  soft-tissue]
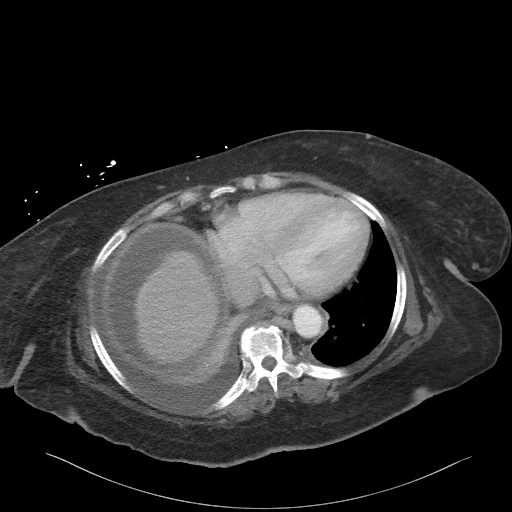
[im 81/87  soft-tissue]
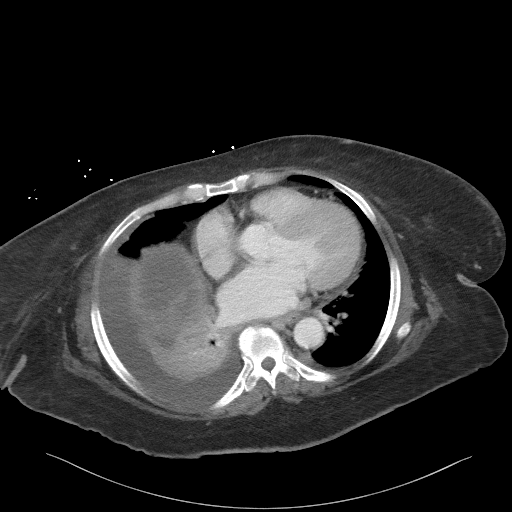

[Series 6: coronal st · coronal · 0.80mm/px · 3 of 103 slices shown]
[im 35/103  soft-tissue]
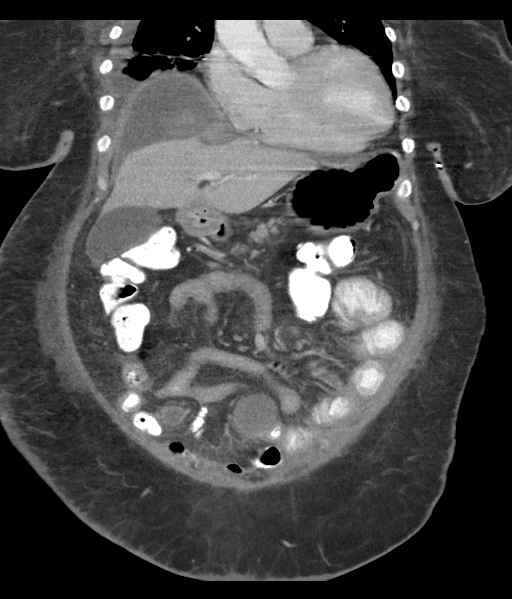
[im 46/103  soft-tissue]
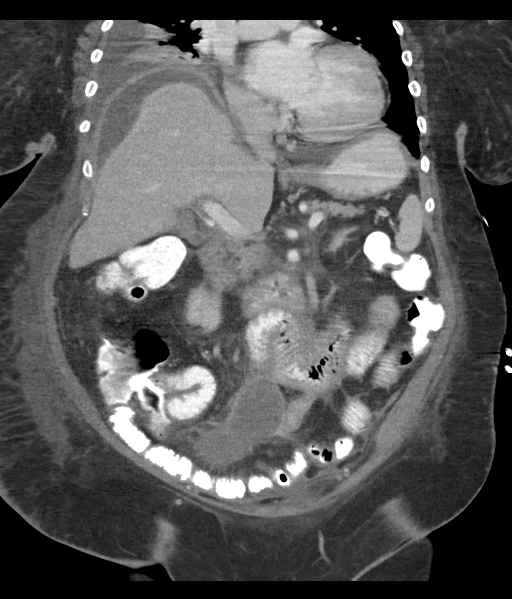
[im 57/103  soft-tissue]
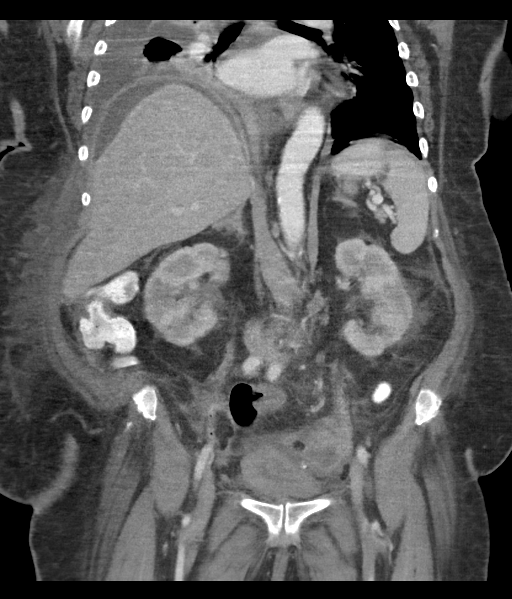

[15 of 46 positions shown; findings below may reference images not displayed]

FINDINGS: Motion degraded images.

Lower chest: Moderate right pleural effusion with associated right
lower lobe atelectasis. Cardiomegaly.

Hepatobiliary: Liver is within normal limits.

Gallbladder is unremarkable. No intrahepatic or extrahepatic ductal
dilatation.

Pancreas: Within normal limits.

Spleen: Within normal limits.

Adrenals/Urinary Tract: Mild thickening of the bilateral renal gland
without discrete mass.

Kidneys are within normal limits.  No hydronephrosis.

Bladder is thick-walled although decompressed by indwelling Foley
catheter.

Stomach/Bowel: Stomach is within normal limits.

No evidence of bowel obstruction. Mildly prominent loops small bowel
in the left mid abdomen with thickened fold, suggesting reactive
changes versus small bowel enteritis.

Normal appendix (series 2/image 63).

Mild rectal wall thickening, (series 2/image 67), new, favored to be
reactive. No pneumatosis.

Vascular/Lymphatic: No evidence of abdominal aortic aneurysm.

Atherosclerotic calcifications of the abdominal aorta and branch
vessels.

No suspicious abdominopelvic lymphadenopathy.

Reproductive: Heterogeneous uterus, displaced to the left.

Left ovary is notable for mild adnexal fluid.

Right ovary is grossly unremarkable.

Other: 5.7 x 7.2 cm mesenteric fluid collection in the anterior
lower abdomen (series 2/image 63), with rim enhancement, worrisome
for developing abscess. Direct communication with small bowel is not
apparent. This collection abuts the bladder dome (sagittal image
80).

Additional 4.1 x 7.4 cm fluid collection in the pelvic cul-de-sac
(series 2/image 69), with rim enhancement, worrisome for developing
abscess.

Additional mild perihepatic ascites (series 2/image 14).

Additional mesenteric/abdominal fluid collections (series 2/images
61 and 71) and left adnexal fluid collections (series 2/images 67
and 68).

No free air.

Musculoskeletal: Degenerative changes of the visualized
thoracolumbar spine.
IMPRESSION: Multiple abdominopelvic fluid collections, including a dominant
x 7.2 cm mesenteric fluid collection in the anterior lower abdomen
and a dominant 4.1 x 7.4 cm fluid collection in the pelvic
cul-de-sac, worrisome for developing abscesses.

Reactive inflammatory changes involving small bowel and rectum, as
described above. No pneumatosis or free air.

Bladder is thick-walled although decompressed by indwelling Foley
catheter. Bladder dome abuts the dominant lower anterior abdominal
fluid collection noted above.

Moderate right pleural effusion.

No evidence of bowel obstruction.  Normal appendix.

## 2020-01-06 IMAGING — CT CT IMAGE GUIDED DRAINAGE BY PERCUTANEOUS CATHETER
2 of 5 series · 11 of 32 positions shown, 17 images · non-contrast
Comparison: none

INDICATION: 52-year-old female with multiple intra-abdominal fluid collections
from a presumed gastrointestinal source. She presents for attempted
CT-guided drain placement versus aspiration.
TECHNIQUE: Informed written consent was obtained from the patient after a
thorough discussion of the procedural risks, benefits and
alternatives. All questions were addressed. Maximal Sterile Barrier
Technique was utilized including caps, mask, sterile gowns, sterile
gloves, sterile drape, hand hygiene and skin antiseptic. A timeout
was performed prior to the initiation of the procedure.

[Series 2: i-spiral 5.0 b40f · axial · 0.89mm/px · z∈[+734,+1052]mm · 9 of 115 slices shown, 15 images (1 of 2)]
[im 12/115  soft-tissue]
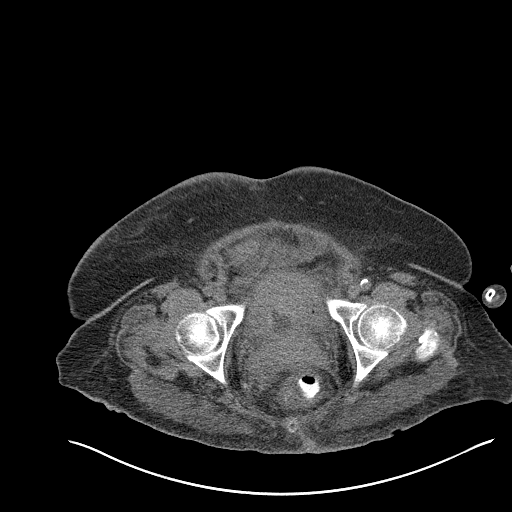
[im 12/115  bone]
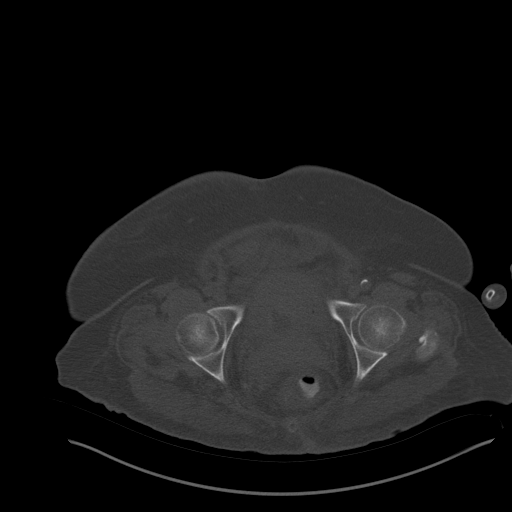
[im 23/115  soft-tissue]
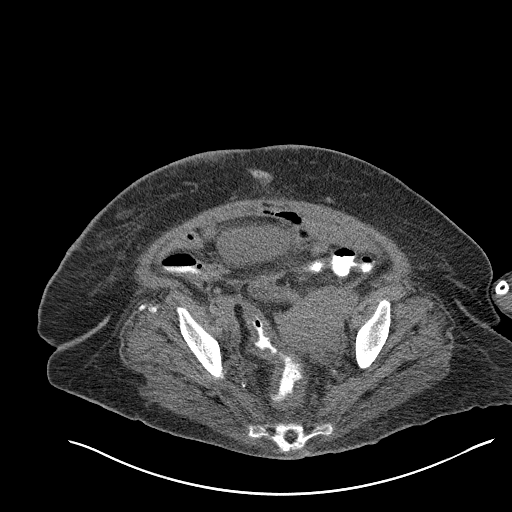
[im 35/115  soft-tissue]
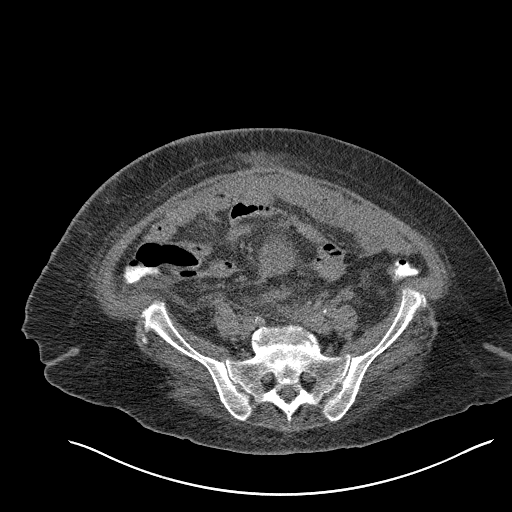
[im 46/115  soft-tissue]
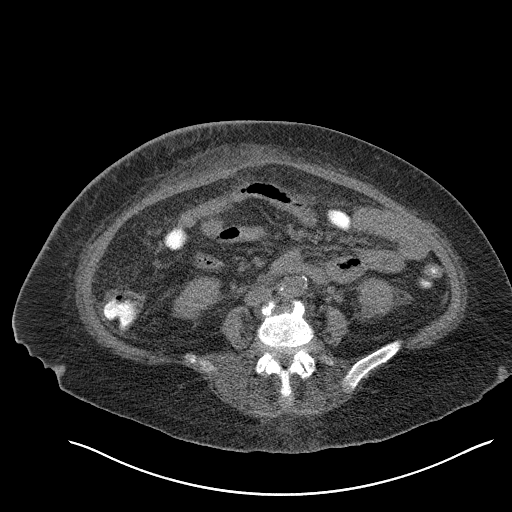
[im 58/115  soft-tissue]
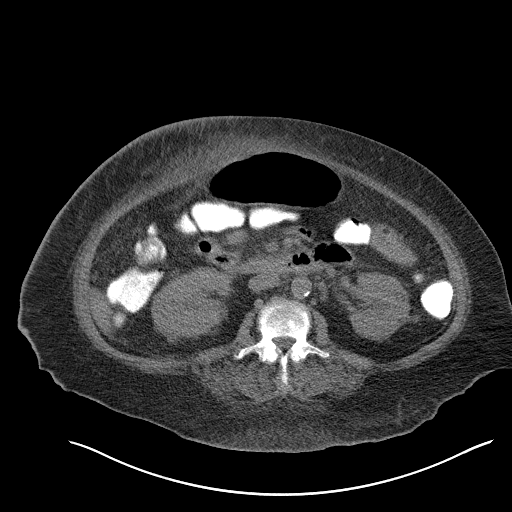
[im 69/115  soft-tissue]
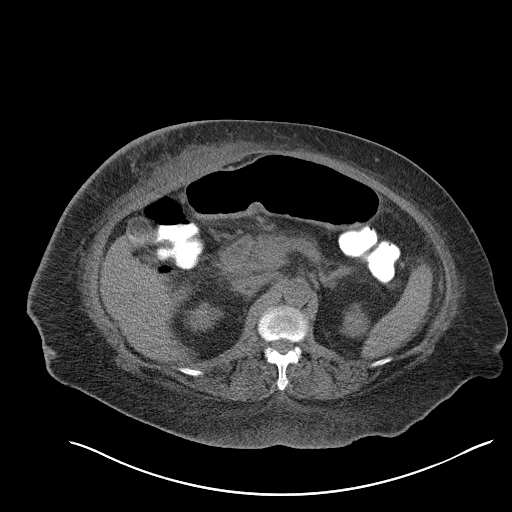
[im 69/115  lung]
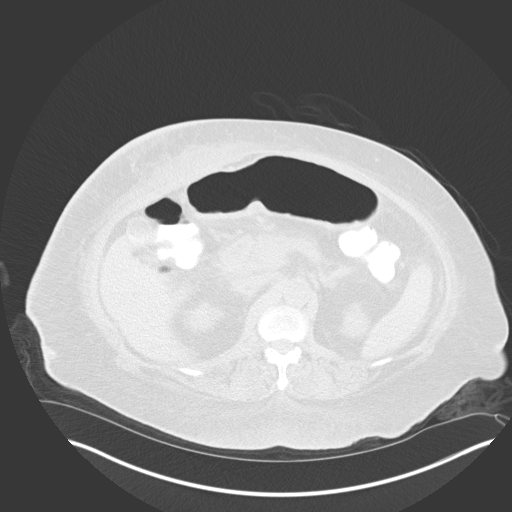
[im 80/115  soft-tissue]
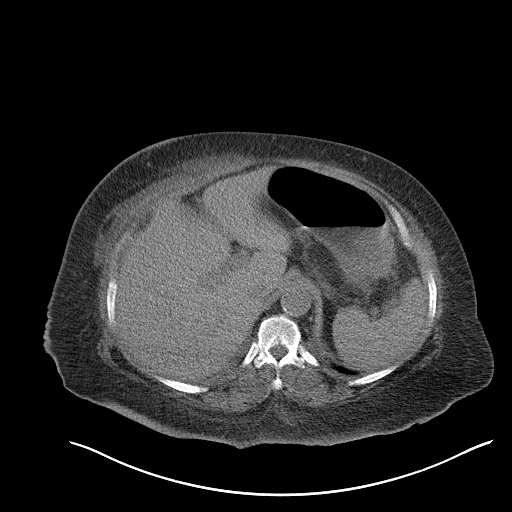
[im 80/115  lung]
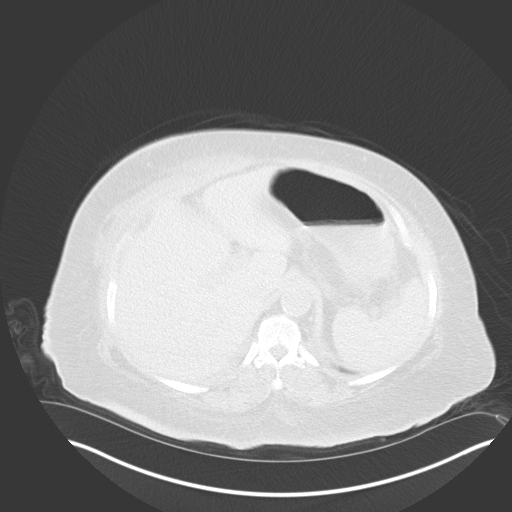
[im 92/115  soft-tissue]
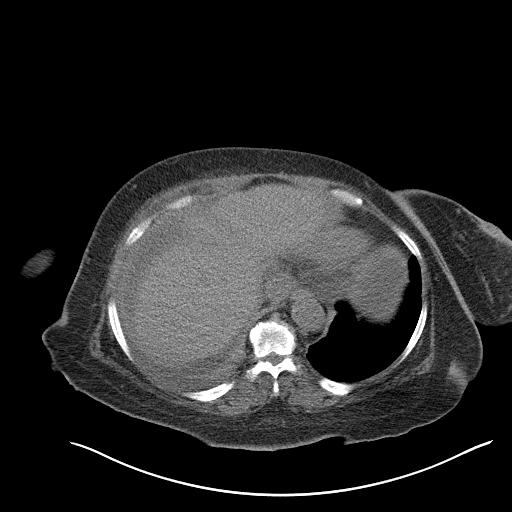
[im 92/115  lung]
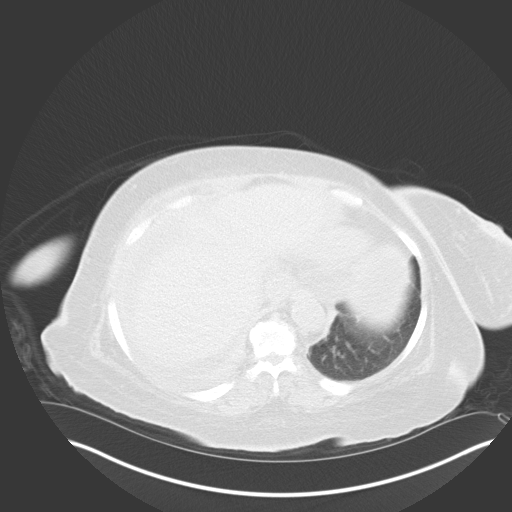
[im 103/115  soft-tissue]
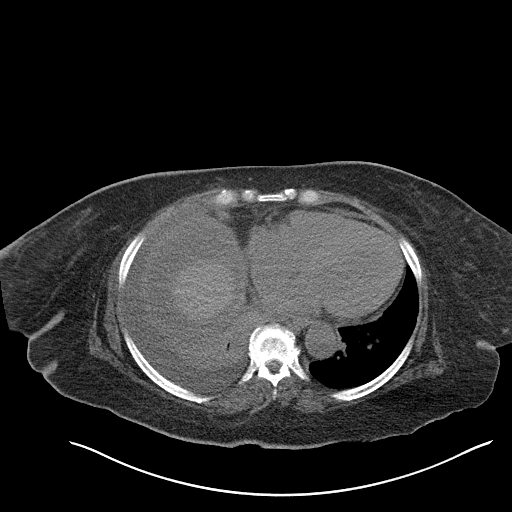
[im 103/115  lung]
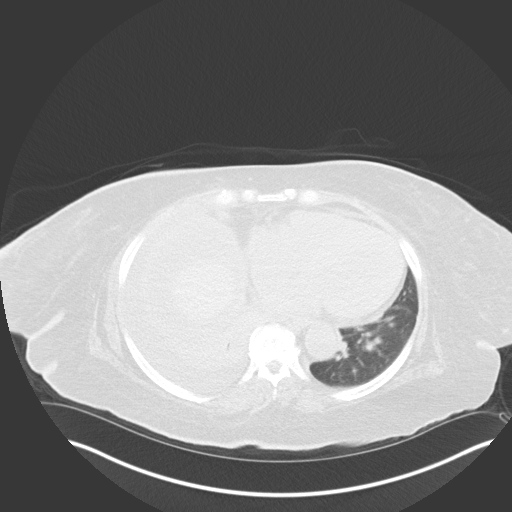
[im 103/115  bone]
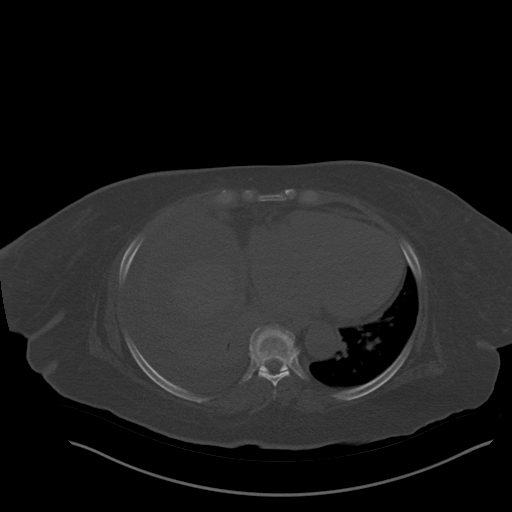

[Series 6: i-spiral 5.0 b40f · axial · 0.90mm/px · z∈[+1016,+1072]mm · 2 of 49 slices shown (2 of 2)]
[im 17/49  soft-tissue]
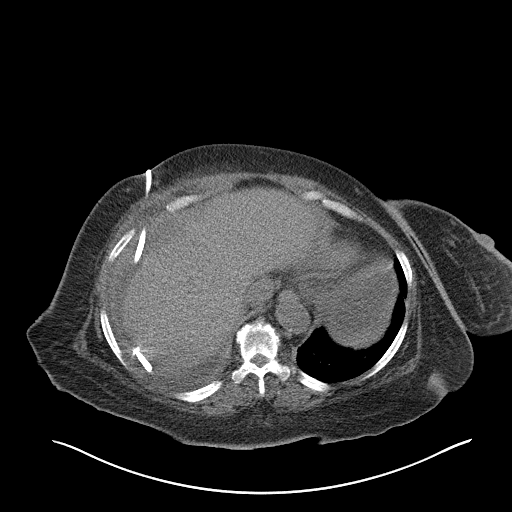
[im 33/49  soft-tissue]
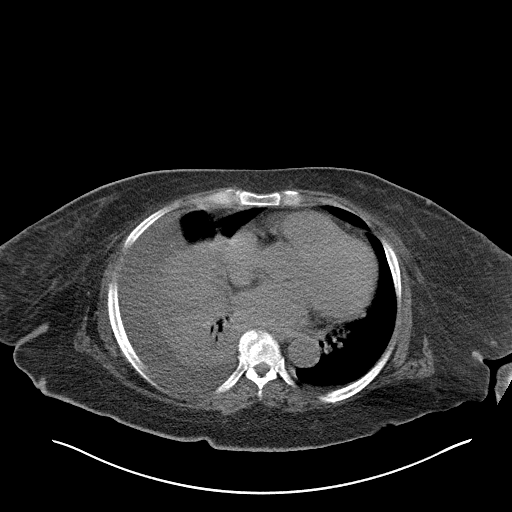

[11 of 32 positions shown; findings below may reference images not displayed]

EXAM:
CT GUIDED DRAINAGE OF perihepatic ABSCESS

MEDICATIONS:
The patient is currently admitted to the hospital and receiving
intravenous antibiotics. The antibiotics were administered within an
appropriate time frame prior to the initiation of the procedure.

ANESTHESIA/SEDATION:
3 mg IV Versed 150 mcg IV Fentanyl

Moderate Sedation Time:  30 minutes

The patient was continuously monitored during the procedure by the
interventional radiology nurse under my direct supervision.

COMPLICATIONS:
None immediate.
PROCEDURE:
The operative field was prepped with chlorhexidine in a sterile
fashion, and a sterile drape was applied covering the operative
field. A sterile gown and sterile gloves were used for the
procedure. Local anesthesia was provided with 1% Lidocaine.

During the procedure it was noted that the patient uses accessory
abdominal muscles during breathing. Her stomach heaves with every
breath. This makes localization of her intra-abdominal fluid
collection somewhat challenging. On the initial imaging, there was a
questionable very small window to the mesenteric fluid collection.
Local anesthesia was attained by infiltration with 1% lidocaine a
small dermatotomy was made. An attempt was made to guide an 18 gauge
trocar needle from the skin surface into this fluid collection,
however during intermittent CT imaging, it was evident that there
was significant motion of the overlying small bowel resulting in
only in intermittent window. That combined with the significant
heaving of the abdominal wall resulted in a combination of factors
that was too dangerous to proceed. This portion of the procedure was
aborted.

Attention was turned to the right upper quadrant perihepatic fluid
collection. Repeat CT imaging was performed. A suitable skin entry
site was identified and marked. The skin was again prepped and
draped in standard fashion using chlorhexidine skin prep. Local
anesthesia was attained by infiltration with 1% lidocaine. A small
dermatotomy was made. An 18 gauge trocar needle was advanced into
the fluid collection under intermittent CT guidance. Once within the
fluid collection, a 0.035 wire was advanced in the fluid collection,
the needle was removed and the skin tract was dilated to 12 French.
[REDACTED] French all-purpose drainage catheter was then modified
with additional sideholes, advanced over the wire and formed.
Aspiration yields approximately 25 mL of turbid serosanguineous
ascitic fluid.

Post drain placement CT imaging demonstrates a well-positioned
drainage catheter lateral and posterior to the right hepatic lobe
within the fluid collection. The drain was connected to JP bulb
suction and secured to the skin with 0 Prolene suture and an
adhesive fixation device.
FINDINGS: Approximately 25 mL turbid ascitic fluid aspirated from the right
perihepatic fluid collection.

The intra mesenteric fluid collection is not amenable to
percutaneous aspiration or drainage at this time.
IMPRESSION: 1. Successful placement of a 12 French drainage catheter in the
right upper quadrant perihepatic fluid collection with aspiration of
turbid ascitic fluid. A sample was sent for Gram stain and culture.
2. Unsuccessful attempted aspiration/drain placement of the
mesenteric fluid collection. There is no safe window to proceed.

## 2020-01-11 ENCOUNTER — Other Ambulatory Visit: Payer: Self-pay | Admitting: Family Medicine

## 2020-01-11 DIAGNOSIS — I10 Essential (primary) hypertension: Secondary | ICD-10-CM

## 2020-01-11 DIAGNOSIS — R062 Wheezing: Secondary | ICD-10-CM | POA: Diagnosis not present

## 2020-01-11 DIAGNOSIS — I509 Heart failure, unspecified: Secondary | ICD-10-CM | POA: Diagnosis not present

## 2020-02-02 IMAGING — CT CT ABD-PELV W/O CM
2 of 4 series · 15 of 46 positions shown, 17 images · non-contrast
Comparison: 07/05/2018

CLINICAL DATA: Rectal bleeding and low hemoglobin. No pain. History
of chronic kidney disease, congestive heart failure, COPD,
hypertension, diabetes.

EXAM:
CT ABDOMEN AND PELVIS WITHOUT CONTRAST
TECHNIQUE: Multidetector CT imaging of the abdomen and pelvis was performed
following the standard protocol without IV contrast.

[Series 2: axial st · axial · 0.98mm/px · z∈[+918,+1328]mm · 12 of 92 slices shown, 14 images]
[im 5/92  soft-tissue]
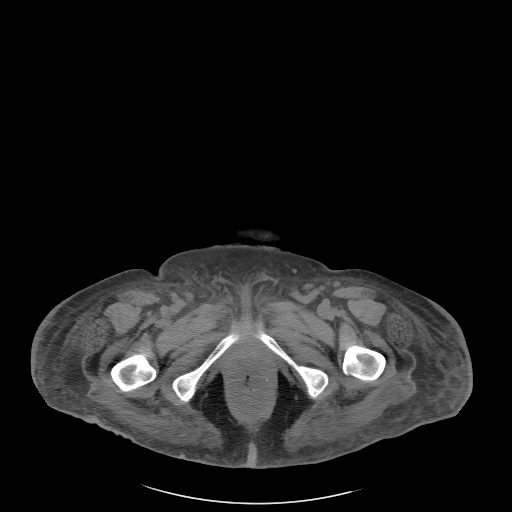
[im 5/92  bone]
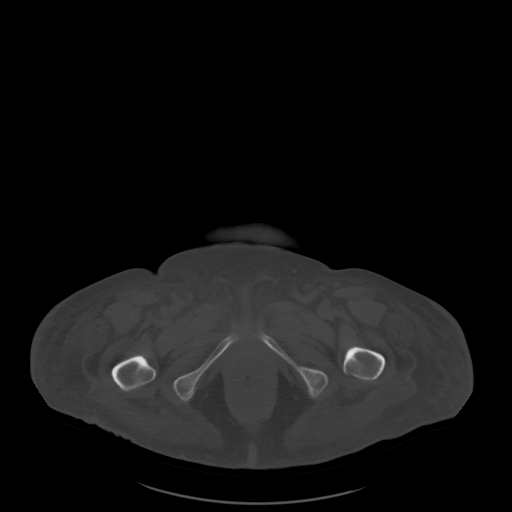
[im 13/92  soft-tissue]
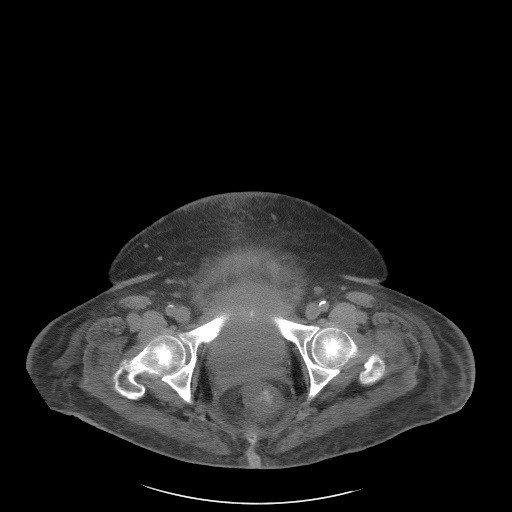
[im 21/92  soft-tissue]
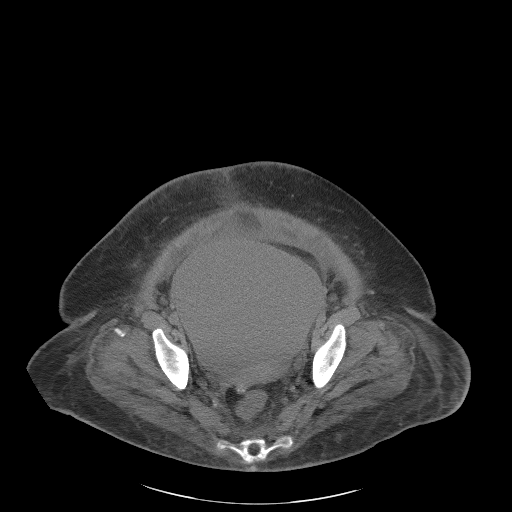
[im 29/92  soft-tissue]
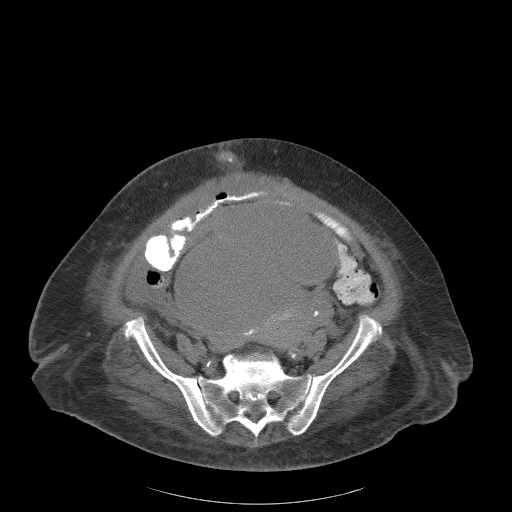
[im 34/92  soft-tissue]
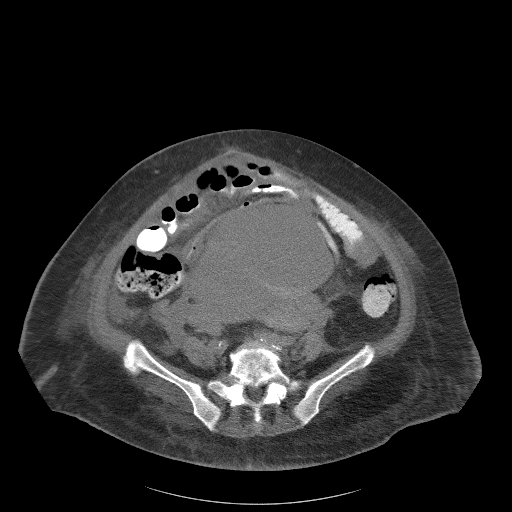
[im 42/92  soft-tissue]
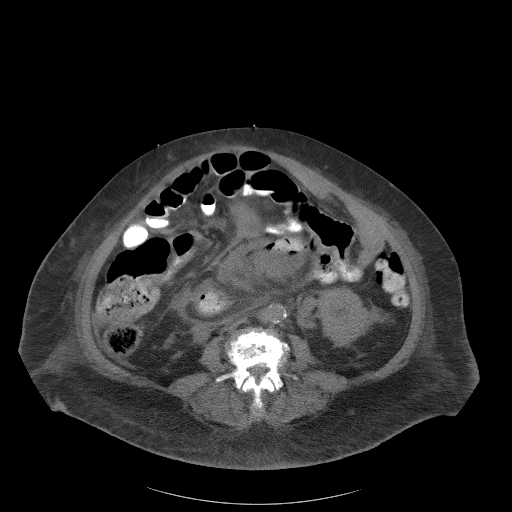
[im 50/92  soft-tissue]
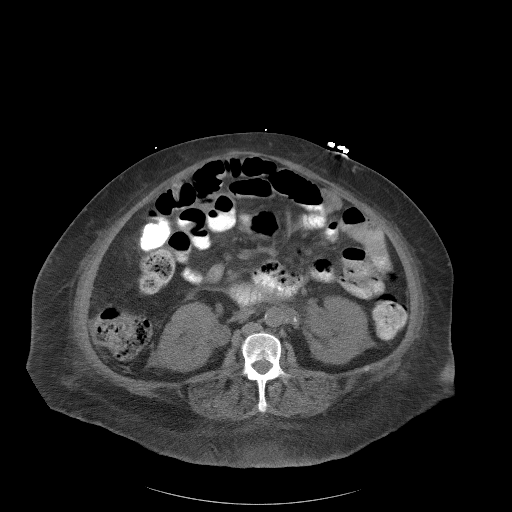
[im 58/92  soft-tissue]
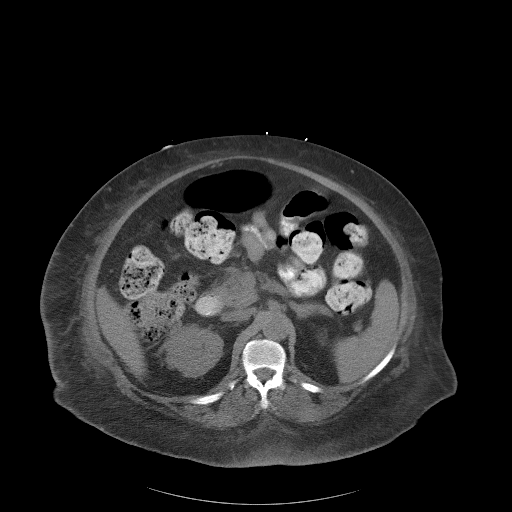
[im 63/92  soft-tissue]
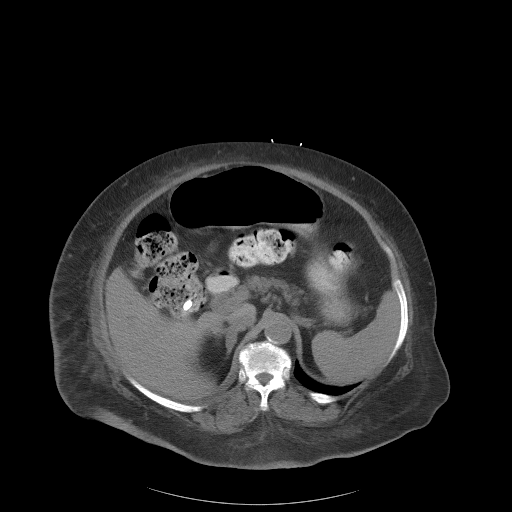
[im 63/92  bone]
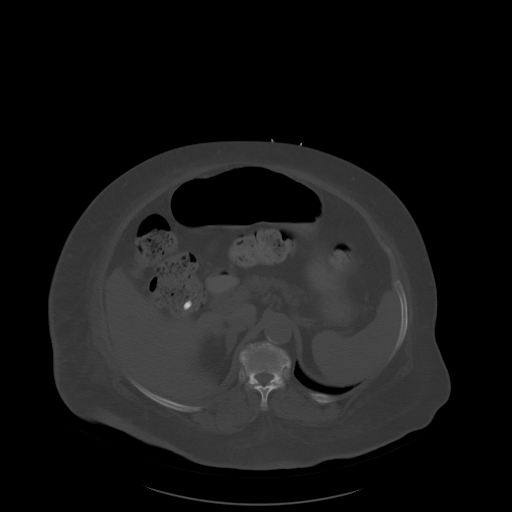
[im 71/92  soft-tissue]
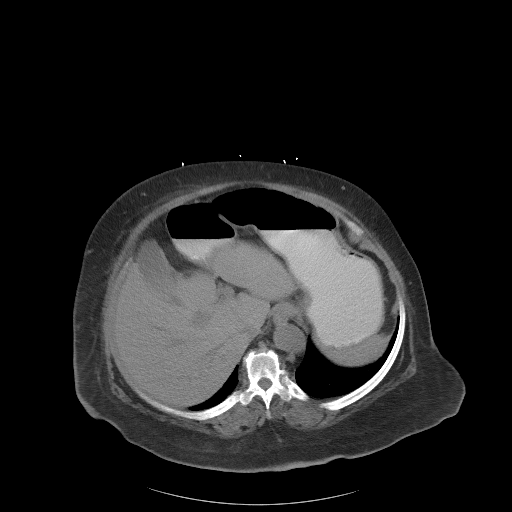
[im 79/92  soft-tissue]
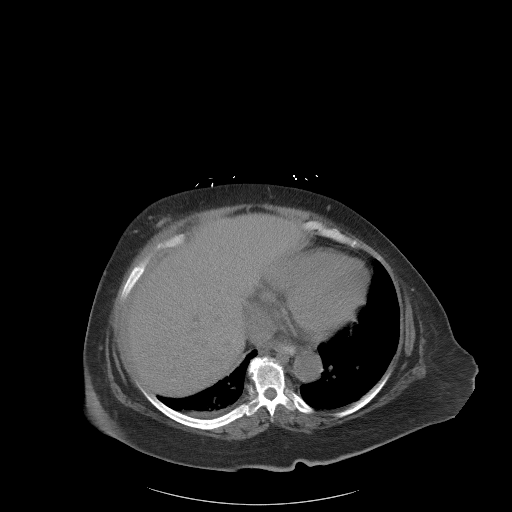
[im 87/92  soft-tissue]
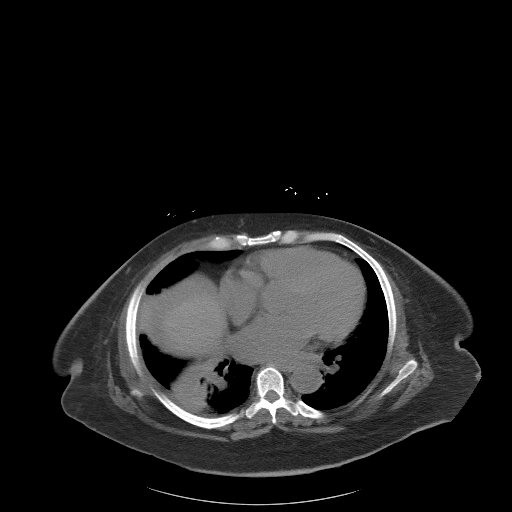

[Series 5: coronal st · coronal · 0.89mm/px · 3 of 115 slices shown]
[im 39/115  soft-tissue]
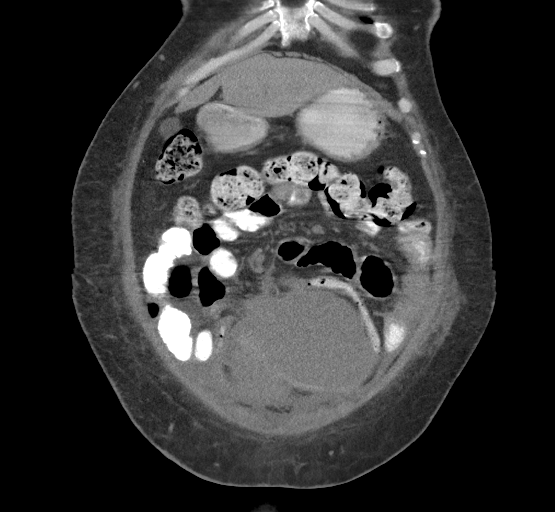
[im 51/115  soft-tissue]
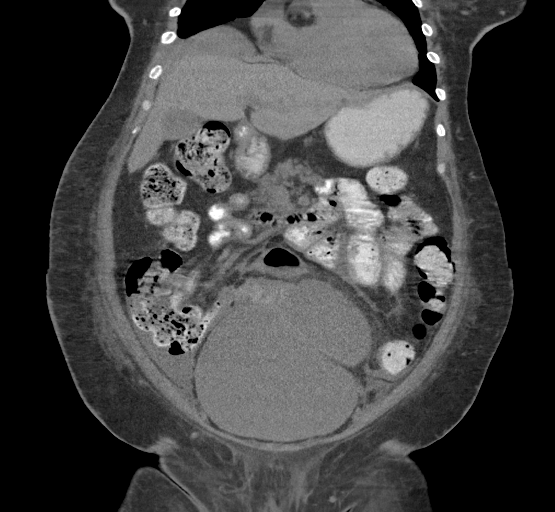
[im 64/115  soft-tissue]
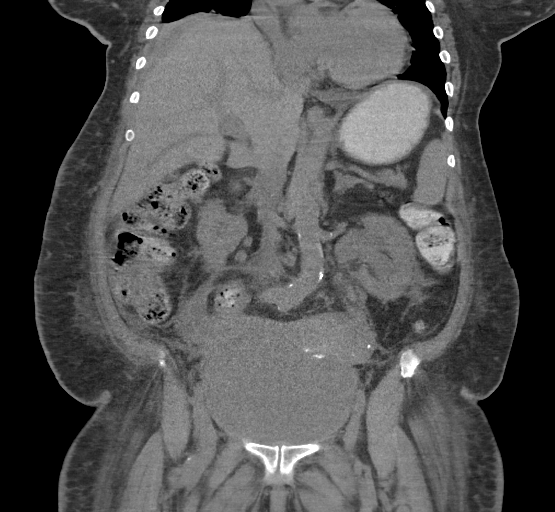

[15 of 46 positions shown; findings below may reference images not displayed]

FINDINGS: Lower chest: Atelectasis or consolidation in the right lung base
with small pleural effusion, improving since previous study.

Hepatobiliary: No focal liver lesions identified. Probable sludge in
the gallbladder. No bile duct dilatation. There is a right
subdiaphragmatic fluid collection with small gas bubble. The
collection measures about 2.1 x 7.1 cm in diameter. No significant
change in size of the collection since previous study. A pigtail
drainage catheter seen previously has been removed in the interval.

Pancreas: Unremarkable. No pancreatic ductal dilatation or
surrounding inflammatory changes.

Spleen: Normal in size without focal abnormality.

Adrenals/Urinary Tract: No adrenal gland nodules. Bilateral
hydronephrosis and hydroureter with distended bladder, possibly
representing reflux disease. Urine in the bladder is somewhat
increased in density which may indicate bladder hemorrhage or
concentrated urine. No bladder wall thickening. No renal or ureteral
stones are seen.

Stomach/Bowel: Increasing size of anterior pelvic fluid collections,
largest now measuring about 7.5 x 11.9 cm in diameter. This could
represent abscess or hematoma. Additional smaller fluid collections
also demonstrated. Bowel are compressed and displaced by the fluid
collections. Stomach, small bowel, and colon are not abnormally
distended. Contrast material flows through the colon to the rectum
without evidence of obstruction. Pelvic small bowel suggest wall
thickening which may indicate enteritis.

Vascular/Lymphatic: Aortic atherosclerosis. No enlarged abdominal or
pelvic lymph nodes.

Reproductive: Uterus and bilateral adnexa are unremarkable.

Other: Small amount of free fluid in the abdomen and pelvis. No free
air. Abdominal wall musculature appears intact.

Musculoskeletal: Degenerative changes in the spine. No destructive
bone lesions.
IMPRESSION: 1. Right subdiaphragmatic fluid collection with small gas bubble
consistent with abscess. This is unchanged in size since previous
study. Drainage catheter was removed in the interval.
2. Increasing size of anterior pelvic fluid collections, largest
measuring 7.5 x 11.9 cm in diameter. This could represent abscess or
hematoma.
3. Bilateral hydronephrosis and hydroureter with distended bladder,
possibly representing reflux disease. Increased density of urine may
indicate bladder hemorrhage. Correlate with urinalysis.
4. Small amount of free fluid in the abdomen and pelvis.
5. Atelectasis or consolidation in the right lung base with small
pleural effusion, improving since previous study.

Aortic Atherosclerosis ([id number]).

These results will be called to the ordering clinician or
representative by the Radiologist Assistant, and communication
documented in the PACS or zVision Dashboard.

## 2020-02-11 DIAGNOSIS — I509 Heart failure, unspecified: Secondary | ICD-10-CM | POA: Diagnosis not present

## 2020-02-11 DIAGNOSIS — R062 Wheezing: Secondary | ICD-10-CM | POA: Diagnosis not present

## 2020-02-13 ENCOUNTER — Other Ambulatory Visit: Payer: Self-pay | Admitting: Family Medicine

## 2020-02-13 DIAGNOSIS — R059 Cough, unspecified: Secondary | ICD-10-CM

## 2020-02-13 DIAGNOSIS — R05 Cough: Secondary | ICD-10-CM

## 2020-03-07 ENCOUNTER — Telehealth: Payer: Self-pay

## 2020-03-07 NOTE — Telephone Encounter (Signed)
Pt is requesting A1C - scheduled for Monday 5/17 - called and did not get answer or machine - mailed appt reminder to come to the office on Monday 5/17

## 2020-03-11 ENCOUNTER — Ambulatory Visit: Payer: Medicare Other

## 2020-03-12 DIAGNOSIS — I509 Heart failure, unspecified: Secondary | ICD-10-CM | POA: Diagnosis not present

## 2020-03-12 DIAGNOSIS — R062 Wheezing: Secondary | ICD-10-CM | POA: Diagnosis not present

## 2020-03-21 IMAGING — US US RENAL
1 series · 14 of 25 positions shown · non-contrast
Comparison: Ultrasound 07/14/2018, CT 07/12/2018

CLINICAL DATA: Hydronephrosis

EXAM:
RENAL / URINARY TRACT ULTRASOUND COMPLETE

[Series 1: us renal · 14 of 29 slices shown]
[im 1/29]
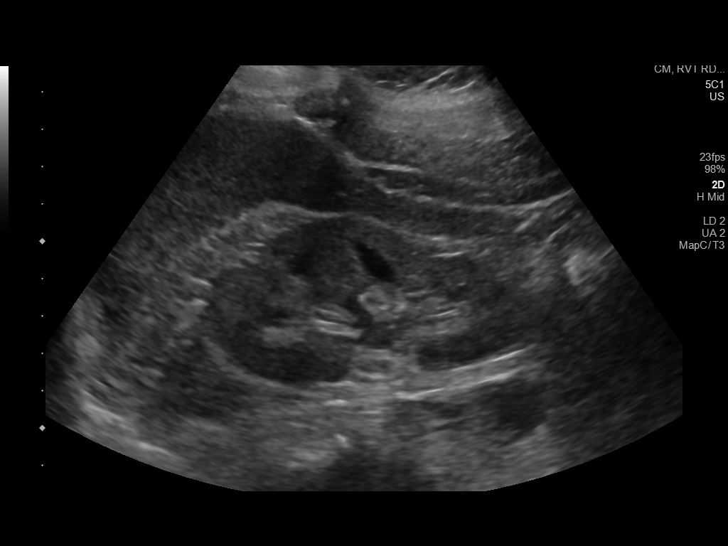
[im 3/29]
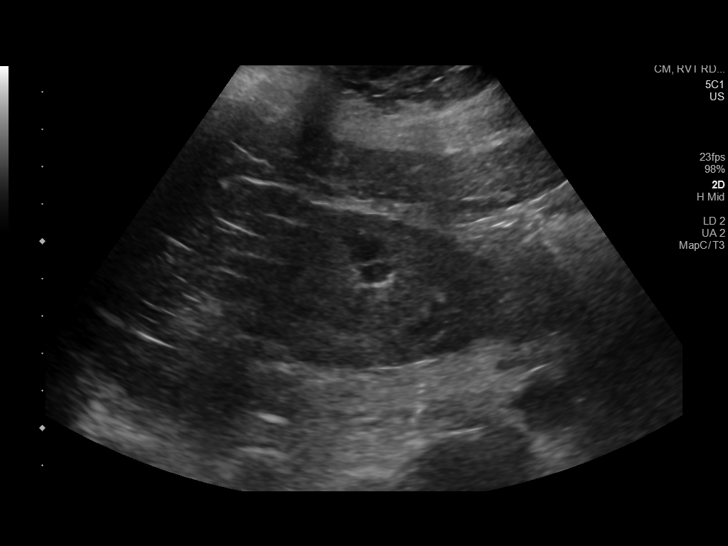
[im 5/29]
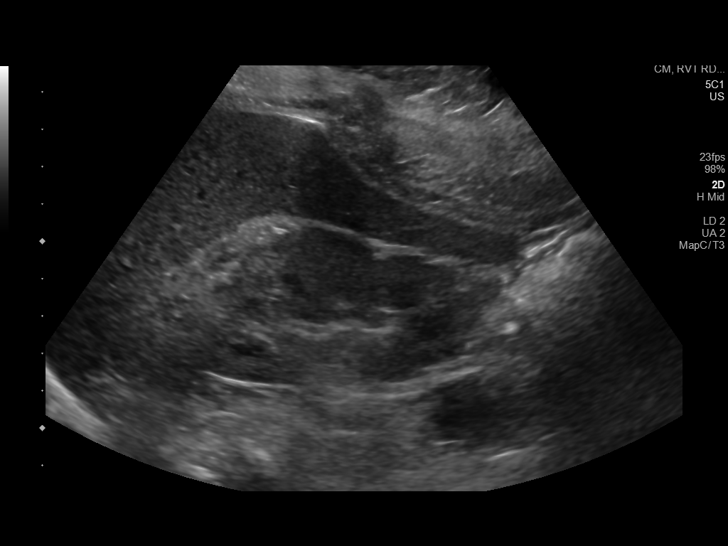
[im 8/29]
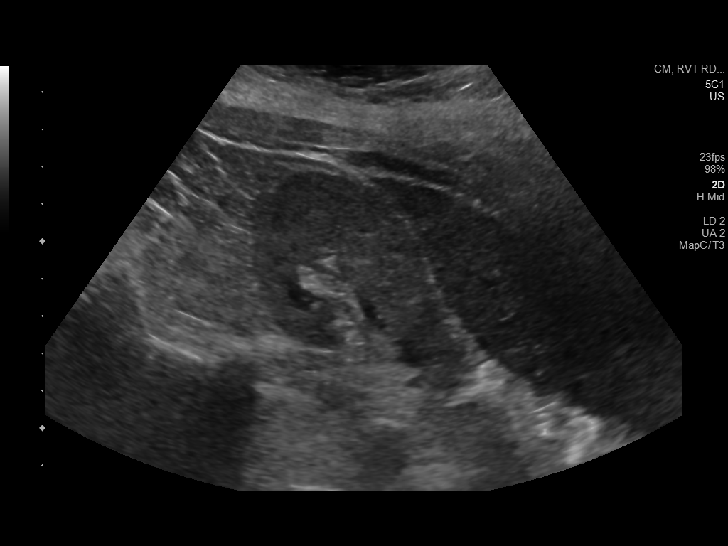
[im 10/29]
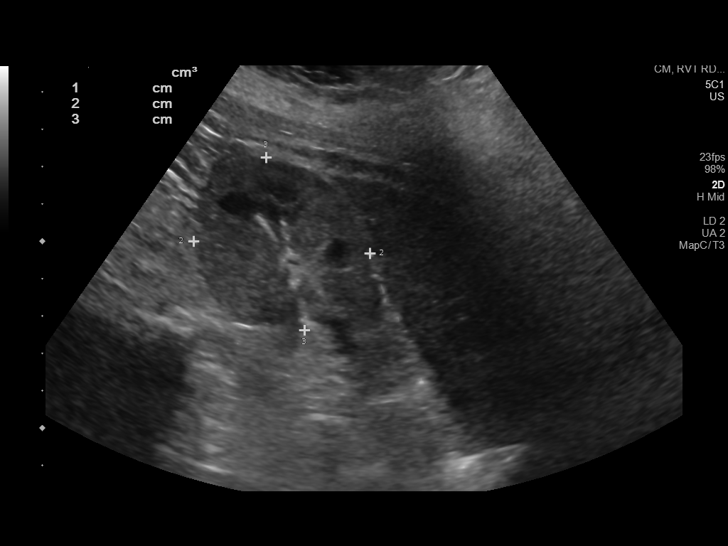
[im 11/29]
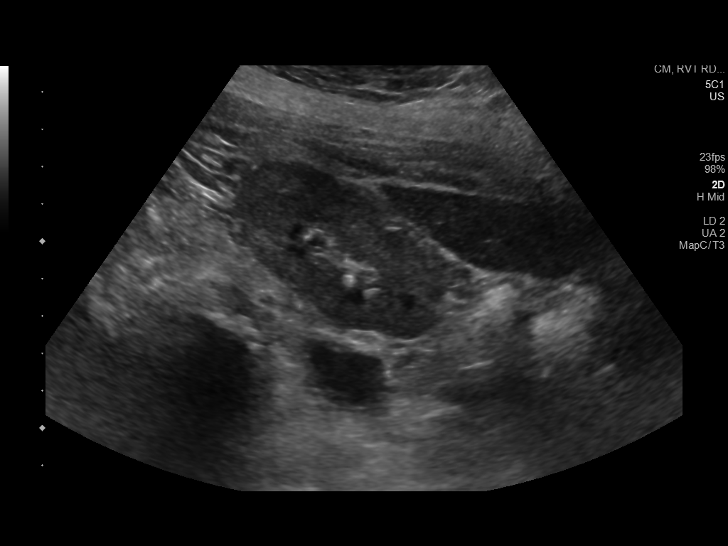
[im 13/29]
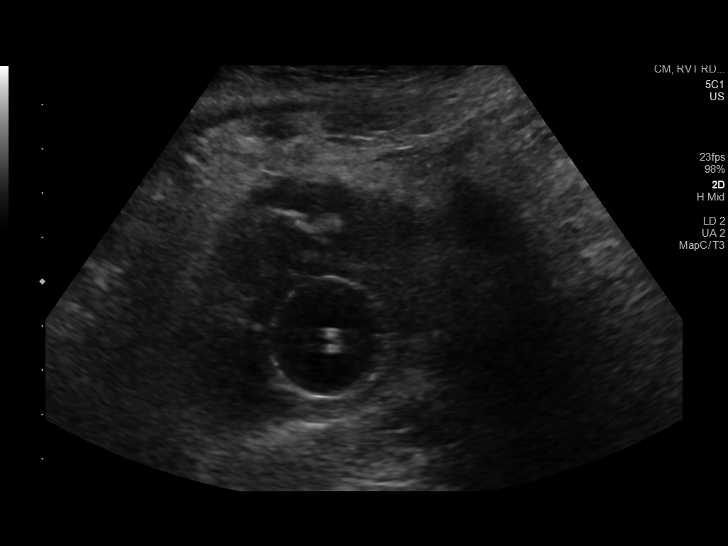
[im 16/29]
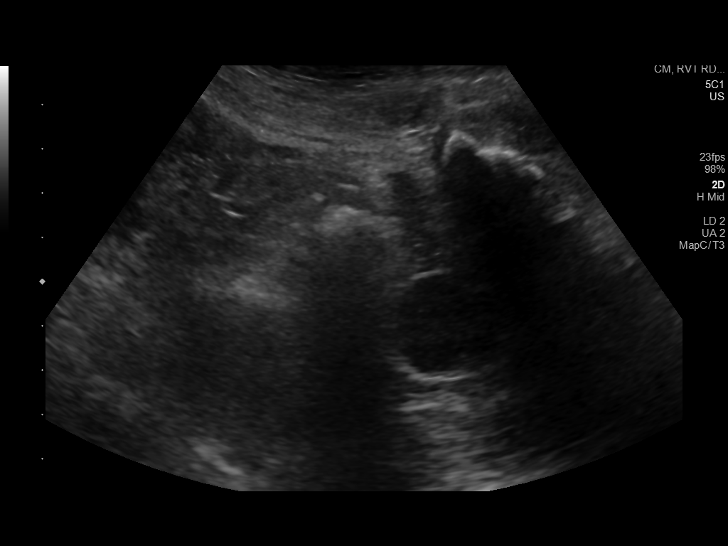
[im 18/29]
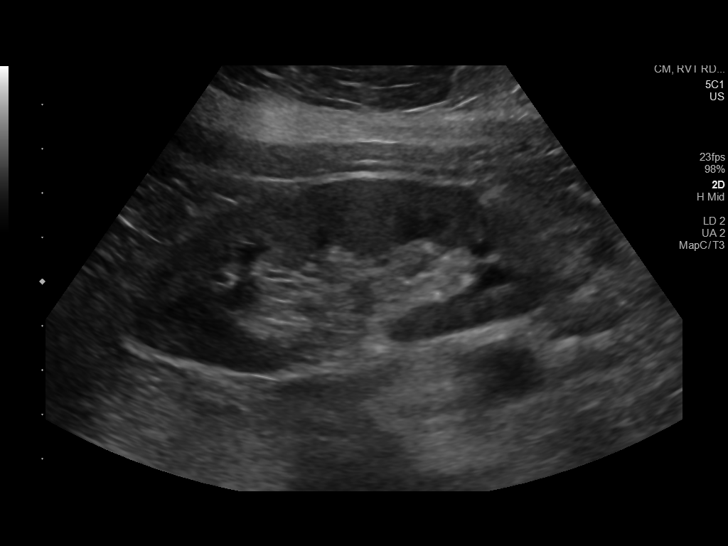
[im 19/29]
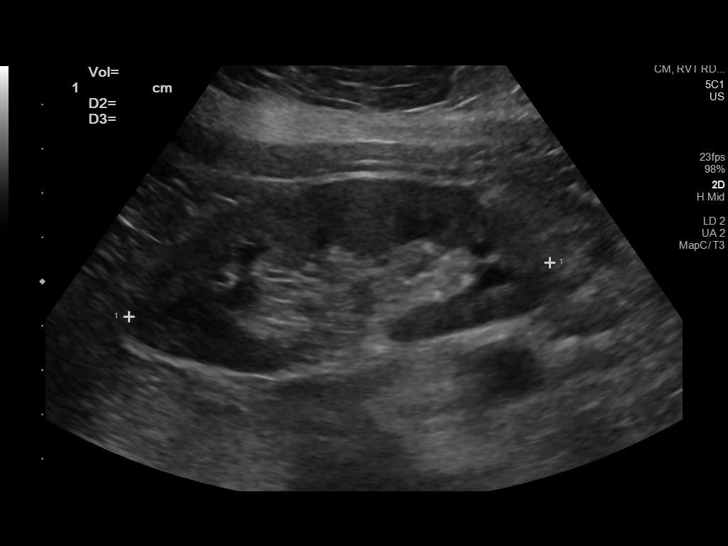
[im 22/29]
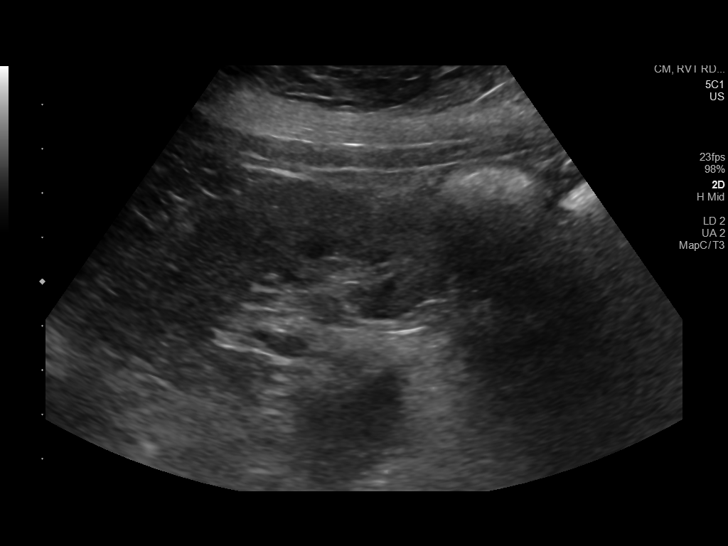
[im 24/29]
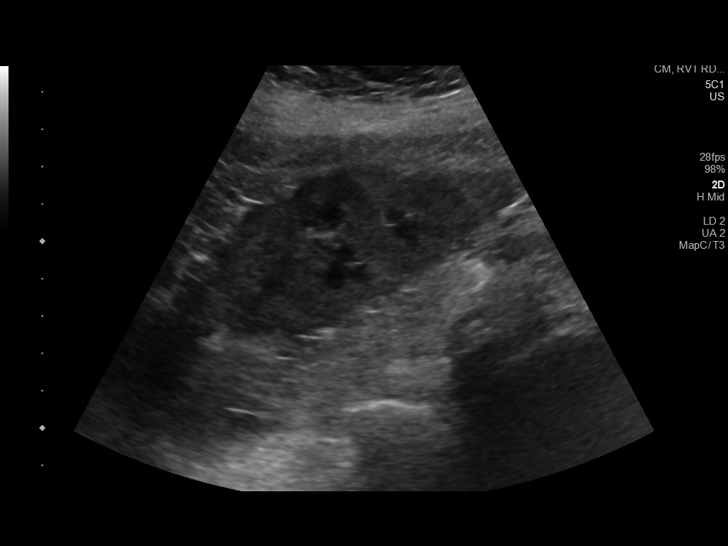
[im 26/29]
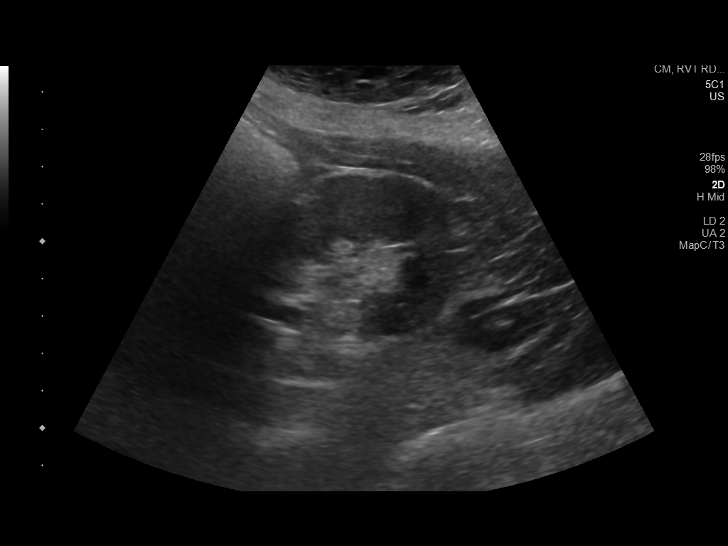
[im 29/29]
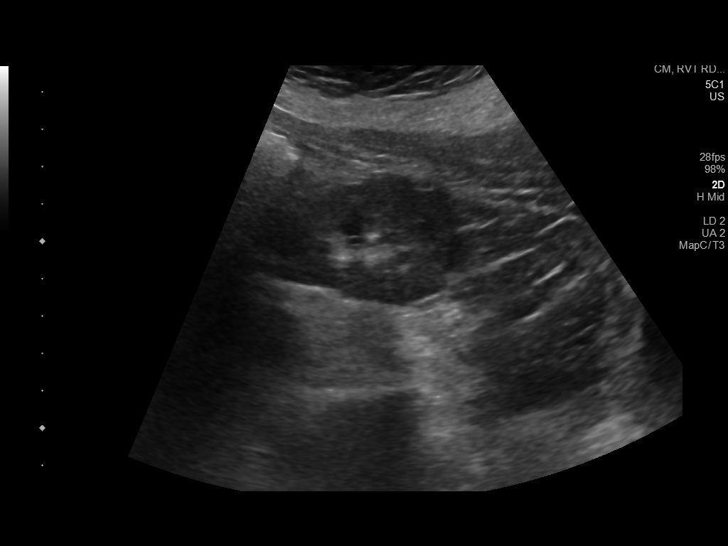

[14 of 25 positions shown; findings below may reference images not displayed]

FINDINGS: Right Kidney:

Renal measurements: 9.5 cm length by 4.7 cm height by 4.7 cm wide =
volume: 111.3 mL. Cortical echogenicity within normal limits.
Decreased right hydronephrosis with minimal right renal pelvis
dilatation.

Left Kidney:

Renal measurements: 9.6 cm length by 5 cm height by 4.7 cm wide =
volume: 118.9 mL. Cortical echogenicity within normal limits. No
hydronephrosis. No focal abnormality.

Bladder:

Foley catheter in the bladder
IMPRESSION: 1. Decreased hydronephrosis on the right with minimal residual right
renal pelvis dilatation
2. No significant left hydronephrosis

## 2020-04-12 ENCOUNTER — Other Ambulatory Visit: Payer: Self-pay | Admitting: Family Medicine

## 2020-04-12 DIAGNOSIS — R062 Wheezing: Secondary | ICD-10-CM | POA: Diagnosis not present

## 2020-04-12 DIAGNOSIS — I509 Heart failure, unspecified: Secondary | ICD-10-CM | POA: Diagnosis not present

## 2020-04-18 ENCOUNTER — Telehealth (INDEPENDENT_AMBULATORY_CARE_PROVIDER_SITE_OTHER): Payer: Medicare Other | Admitting: Family Medicine

## 2020-04-18 ENCOUNTER — Encounter: Payer: Self-pay | Admitting: Family Medicine

## 2020-04-18 ENCOUNTER — Other Ambulatory Visit: Payer: Self-pay

## 2020-04-18 VITALS — BP 160/88 | Ht 61.0 in | Wt 182.0 lb

## 2020-04-18 DIAGNOSIS — Z0000 Encounter for general adult medical examination without abnormal findings: Secondary | ICD-10-CM

## 2020-04-18 DIAGNOSIS — Z122 Encounter for screening for malignant neoplasm of respiratory organs: Secondary | ICD-10-CM | POA: Diagnosis not present

## 2020-04-18 NOTE — Patient Instructions (Signed)
[name] , Thank you for taking time to come for your Medicare Wellness Visit. I appreciate your ongoing commitment to your health goals. Please review the following plan we discussed and let me know if I can assist you in the future.   Please continue to practice social distancing to keep you, your family, and our community safe.  If you must go out, please wear a Mask and practice good handwashing.  Screening recommendations/referrals: Colonoscopy: up to date Mammogram: up to date Bone Density: n/a, due at age 75 Recommended yearly ophthalmology/optometry visit for glaucoma screening and checkup Recommended yearly dental visit for hygiene and checkup  Vaccinations: Influenza vaccine: Up to date Pneumococcal vaccine: up to date Tdap vaccine:  Up to date Shingles vaccine: Call Insurance/Pharmacy about this  Advanced directives: Mailed today  Conditions/risks identified: Falls, Smoking  Next appointment: In office visit with Dr [name] and 1 year for AWV  Preventive Care 40-64 Years, Female Preventive care refers to lifestyle choices and visits with your health care provider that can promote health and wellness. What does preventive care include?  A yearly physical exam. This is also called an annual well check.  Dental exams once or twice a year.  Routine eye exams. Ask your health care provider how often you should have your eyes checked.  Personal lifestyle choices, including:  Daily care of your teeth and gums.  Regular physical activity.  Eating a healthy diet.  Avoiding tobacco and drug use.  Limiting alcohol use.  Practicing safe sex.  Taking low-dose aspirin daily starting at age 67.  Taking vitamin and mineral supplements as recommended by your health care provider. What happens during an annual well check? The services and screenings done by your health care provider during your annual well check will depend on your age, overall health, lifestyle risk  factors, and family history of disease. Counseling  Your health care provider may ask you questions about your:  Alcohol use.  Tobacco use.  Drug use.  Emotional well-being.  Home and relationship well-being.  Sexual activity.  Eating habits.  Work and work Astronomer.  Method of birth control.  Menstrual cycle.  Pregnancy history. Screening  You may have the following tests or measurements:  Height, weight, and BMI.  Blood pressure.  Lipid and cholesterol levels. These may be checked every 5 years, or more frequently if you are over 78 years old.  Skin check.  Lung cancer screening. You may have this screening every year starting at age 51 if you have a 30-pack-year history of smoking and currently smoke or have quit within the past 15 years.  Fecal occult blood test (FOBT) of the stool. You may have this test every year starting at age 49.  Flexible sigmoidoscopy or colonoscopy. You may have a sigmoidoscopy every 5 years or a colonoscopy every 10 years starting at age 42.  Hepatitis C blood test.  Hepatitis B blood test.  Sexually transmitted disease (STD) testing.  Diabetes screening. This is done by checking your blood sugar (glucose) after you have not eaten for a while (fasting). You may have this done every 1-3 years.  Mammogram. This may be done every 1-2 years. Talk to your health care provider about when you should start having regular mammograms. This may depend on whether you have a family history of breast cancer.  BRCA-related cancer screening. This may be done if you have a family history of breast, ovarian, tubal, or peritoneal cancers.  Pelvic exam and Pap test.  This may be done every 3 years starting at age 46. Starting at age 62, this may be done every 5 years if you have a Pap test in combination with an HPV test.  Bone density scan. This is done to screen for osteoporosis. You may have this scan if you are at high risk for  osteoporosis. Discuss your test results, treatment options, and if necessary, the need for more tests with your health care provider. Vaccines  Your health care provider may recommend certain vaccines, such as:  Influenza vaccine. This is recommended every year.  Tetanus, diphtheria, and acellular pertussis (Tdap, Td) vaccine. You may need a Td booster every 10 years.  Zoster vaccine. You may need this after age 24.  Pneumococcal 13-valent conjugate (PCV13) vaccine. You may need this if you have certain conditions and were not previously vaccinated.  Pneumococcal polysaccharide (PPSV23) vaccine. You may need one or two doses if you smoke cigarettes or if you have certain conditions. Talk to your health care provider about which screenings and vaccines you need and how often you need them. This information is not intended to replace advice given to you by your health care provider. Make sure you discuss any questions you have with your health care provider. Document Released: 11/08/2015 Document Revised: 07/01/2016 Document Reviewed: 08/13/2015 Elsevier Interactive Patient Education  2017 [employer] Prevention in the Home Falls can cause injuries. They can happen to people of all ages. There are many things you can do to make your home safe and to help prevent falls. What can I do on the outside of my home?  Regularly fix the edges of walkways and driveways and fix any cracks.  Remove anything that might make you trip as you walk through a door, such as a raised step or threshold.  Trim any bushes or trees on the path to your home.  Use bright outdoor lighting.  Clear any walking paths of anything that might make someone trip, such as rocks or tools.  Regularly check to see if handrails are loose or broken. Make sure that both sides of any steps have handrails.  Any raised decks and porches should have guardrails on the edges.  Have any leaves, snow, or ice cleared  regularly.  Use sand or salt on walking paths during winter.  Clean up any spills in your garage right away. This includes oil or grease spills. What can I do in the bathroom?  Use night lights.  Install grab bars by the toilet and in the tub and shower. Do not use towel bars as grab bars.  Use non-skid mats or decals in the tub or shower.  If you need to sit down in the shower, use a plastic, non-slip stool.  Keep the floor dry. Clean up any water that spills on the floor as soon as it happens.  Remove soap buildup in the tub or shower regularly.  Attach bath mats securely with double-sided non-slip rug tape.  Do not have throw rugs and other things on the floor that can make you trip. What can I do in the bedroom?  Use night lights.  Make sure that you have a light by your bed that is easy to reach.  Do not use any sheets or blankets that are too big for your bed. They should not hang down onto the floor.  Have a firm chair that has side arms. You can use this for support while you get dressed.  Do not have throw rugs and other things on the floor that can make you trip. What can I do in the kitchen?  Clean up any spills right away.  Avoid walking on wet floors.  Keep items that you use a lot in easy-to-reach places.  If you need to reach something above you, use a strong step stool that has a grab bar.  Keep electrical cords out of the way.  Do not use floor polish or wax that makes floors slippery. If you must use wax, use non-skid floor wax.  Do not have throw rugs and other things on the floor that can make you trip. What can I do with my stairs?  Do not leave any items on the stairs.  Make sure that there are handrails on both sides of the stairs and use them. Fix handrails that are broken or loose. Make sure that handrails are as long as the stairways.  Check any carpeting to make sure that it is firmly attached to the stairs. Fix any carpet that is loose  or worn.  Avoid having throw rugs at the top or bottom of the stairs. If you do have throw rugs, attach them to the floor with carpet tape.  Make sure that you have a light switch at the top of the stairs and the bottom of the stairs. If you do not have them, ask someone to add them for you. What else can I do to help prevent falls?  Wear shoes that:  Do not have high heels.  Have rubber bottoms.  Are comfortable and fit you well.  Are closed at the toe. Do not wear sandals.  If you use a stepladder:  Make sure that it is fully opened. Do not climb a closed stepladder.  Make sure that both sides of the stepladder are locked into place.  Ask someone to hold it for you, if possible.  Clearly mark and make sure that you can see:  Any grab bars or handrails.  First and last steps.  Where the edge of each step is.  Use tools that help you move around (mobility aids) if they are needed. These include:  Canes.  Walkers.  Scooters.  Crutches.  Turn on the lights when you go into a dark area. Replace any light bulbs as soon as they burn out.  Set up your furniture so you have a clear path. Avoid moving your furniture around.  If any of your floors are uneven, fix them.  If there are any pets around you, be aware of where they are.  Review your medicines with your doctor. Some medicines can make you feel dizzy. This can increase your chance of falling. Ask your doctor what other things that you can do to help prevent falls. This information is not intended to replace advice given to you by your health care provider. Make sure you discuss any questions you have with your health care provider. Document Released: 08/08/2009 Document Revised: 03/19/2016 Document Reviewed: 11/16/2014 Elsevier Interactive Patient Education  2017 [employer].

## 2020-04-18 NOTE — Progress Notes (Signed)
Subjective:   [name] is a 54 y.o. female who presents for Medicare Annual (Subsequent) preventive examination.  Location of Patient: Home Location of Provider: Telehealth Consent was obtain for visit to be over via telehealth. I verified that I am speaking with the correct person using two identifiers.   Review of Systems    Cardiac Risk Factors include: hypertension;diabetes mellitus;sedentary lifestyle;smoking/ tobacco exposure     Objective:    Today's Vitals   04/18/20 1442 04/18/20 1443  BP: (!) 160/88   Weight: 182 lb (82.6 kg)   Height: '5\' 1"'  (1.549 m)   PainSc: 0-No pain 0-No pain   Body mass index is 34.39 kg/m.  Advanced Directives 04/18/2020 05/09/2019 11/23/2018 11/23/2018 10/13/2018 10/11/2018 07/12/2018  Does Patient Have a Medical Advance Directive? No No No No No No No  Type of Advance Directive - - - - - - -  Does patient want to make changes to medical advance directive? - - - - - - -  Would patient like information on creating a medical advance directive? Yes (MAU/Ambulatory/Procedural Areas - Information given) No - Patient declined No - Patient declined No - Patient declined No - Patient declined No - Patient declined No - Patient declined  Pre-existing out of facility DNR order (yellow form or pink MOST form) - - - - - - -  Some encounter information is confidential and restricted. Go to Review Flowsheets activity to see all data.    Current Medications (verified) Outpatient Encounter Medications as of 04/18/2020  Medication Sig  . albuterol (PROAIR HFA) 108 (90 Base) MCG/ACT inhaler INHALE 2 PUFFS EVERY 6 HOURS AS NEEDED FOR SHORTNESS OF BREATH/WHEEZING.  Marland Kitchen amLODipine (NORVASC) 10 MG tablet TAKE 1 TABLET BY MOUTH ONCE A DAY.  Marland Kitchen apixaban (ELIQUIS) 5 MG TABS tablet Take 5 mg by mouth 2 (two) times daily.  . blood glucose meter kit and supplies Dispense based on patient and insurance preference. Use to test blood sugar once daily (FOR ICD-10  E10.9, E11.9).  . cloNIDine (CATAPRES) 0.3 MG tablet TAKE 1 TABLET BY MOUTH AT BEDTIME FOR BLOOD PRESSURE  . clopidogrel (PLAVIX) 75 MG tablet TAKE 1 TABLET BY MOUTH DAILY WITH BREAKFAST.  . DULoxetine (CYMBALTA) 30 MG capsule TAKE 2 CAPSULES BY MOUTH ONCE DAILY.  Marland Kitchen ezetimibe (ZETIA) 10 MG tablet TAKE 1 TABLET BY MOUTH ONCE A DAY.  . furosemide (LASIX) 20 MG tablet TAKE 1 TABLET BY MOUTH ONCE A DAY.  Marland Kitchen gabapentin (NEURONTIN) 300 MG capsule TAKE 1 CAPSULE BY MOUTH THREE TIMES A DAY.  . hydrOXYzine (ATARAX/VISTARIL) 25 MG tablet TAKE 1 TABLET BY MOUTH AT BEDTIME FOR ITCHING AND SLEEP.  Marland Kitchen metoprolol succinate (TOPROL-XL) 50 MG 24 hr tablet TAKE 1 TABLET BY MOUTH TWICE A DAY.  . montelukast (SINGULAIR) 10 MG tablet TAKE (1) TABLET BY MOUTH AT BEDTIME.  Marland Kitchen olmesartan (BENICAR) 40 MG tablet TAKE 1 TABLET BY MOUTH ONCE A DAY.  . pantoprazole (PROTONIX) 40 MG tablet TAKE 1 TABLET BY MOUTH ONCE A DAY.  Marland Kitchen potassium chloride SA (KLOR-CON) 20 MEQ tablet TAKE 1&1/2 TABLET BY MOUTH DAILY.  . rosuvastatin (CRESTOR) 10 MG tablet TAKE 1 TABLET BY MOUTH ONCE A DAY.  . solifenacin (VESICARE) 5 MG tablet TAKE 1 TABLET BY MOUTH ONCE DAILY.  Marland Kitchen tiotropium (SPIRIVA HANDIHALER) 18 MCG inhalation capsule Place 18 mcg into inhaler and inhale daily as needed (SHORTNESS OF BREATH).   . TRADJENTA 5 MG TABS tablet TAKE 1 TABLET BY MOUTH ONCE  DAILY.  . traZODone (DESYREL) 50 MG tablet TAKE (1) TABLET BY MOUTH AT BEDTIME.  . [DISCONTINUED] benzonatate (TESSALON) 100 MG capsule Take 1 capsule (100 mg total) by mouth 3 (three) times daily as needed for cough.  . [DISCONTINUED] dextromethorphan (DELSYM) 30 MG/5ML liquid Take 5 mLs (30 mg total) by mouth as needed for cough. (Patient not taking: Reported on 04/18/2020)  . [DISCONTINUED] ipratropium-albuterol (DUONEB) 0.5-2.5 (3) MG/3ML SOLN Take 3 mLs by nebulization every 6 (six) hours as needed (wheezes or SOB). (Patient not taking: Reported on 04/18/2020)  . [DISCONTINUED] traMADol  (ULTRAM) 50 MG tablet Take by mouth every 8 (eight) hours as needed. (Patient not taking: Reported on 04/18/2020)   No facility-administered encounter medications on file as of 04/18/2020.    Allergies (verified) Patient has no known allergies.   History: Past Medical History:  Diagnosis Date  . Alcohol use   . Ankle fracture, lateral malleolus, closed 12/30/2011  . Anxiety   . Breast mass, left 12/15/2011  . Chronic anemia   . Chronic combined systolic and diastolic CHF (congestive heart failure) ([place])   . CKD (chronic kidney disease), stage III    pt denies  . Cocaine abuse ([place])   . COPD (chronic obstructive pulmonary disease) ([place])   . Diabetes mellitus, type 2 ([place])   . Diabetic Charcot foot ([place]) 12/30/2011  . Essential hypertension   . History of GI bleed 12/05/2009   Qualifier: Diagnosis of  By: Zeb Comfort    . Hyperlipidemia   . Hypertensive cardiomyopathy ([place]) 11/08/2012   a. 05/2016: echo showing EF of 20-25% with normal cors by cath in 07/2016.  . Morbid obesity ([place]) 01/31/2008   Qualifier: Diagnosis of  By: Lenn Cal    . Noncompliance with medication regimen   . Obesity   . Panic attacks   . PAT (paroxysmal atrial tachycardia) ([place])    a. started on amiodarone 07/2017 for this.  . Sleep apnea    does not use cpap  . Stroke ([place])    06/24/17  . Tobacco abuse   . Urinary incontinence    Past Surgical History:  Procedure Laterality Date  . BREAST BIOPSY    . CARDIAC CATHETERIZATION N/A 07/28/2016   Procedure: Left Heart Cath and Coronary Angiography;  Surgeon: Jettie Booze, MD;  Location: [facility];  Service: Cardiovascular;  Laterality: N/A;  . COLONOSCOPY N/A 05/10/2019   Procedure: COLONOSCOPY;  Surgeon: Danie Binder, MD;  [facility];  Service: Endoscopy;  Laterality: N/A;  Phenergan 12.5 mg IV in pre-op  . DILATION AND CURETTAGE OF UTERUS    . I & D EXTREMITY Bilateral 09/22/2017   Procedure: BILATERAL DEBRIDEMENT  LEG/FOOT ULCERS, APPLY VERAFLO WOUND VAC;  Surgeon: Newt Minion, MD;  Location: [facility];  Service: Orthopedics;  Laterality: Bilateral;  . I & D EXTREMITY Right 10/11/2018   Procedure: IRRIGATION AND DEBRIDEMENT RIGHT HAND;  Surgeon: [name], MD;  Location: [facility];  Service: Orthopedics;  Laterality: Right;  . I & D EXTREMITY Right 10/13/2018   Procedure: REPEAT IRRIGATION AND DEBRIDEMENT RIGHT HAND;  Surgeon: [name], MD;  Location: [facility];  Service: Orthopedics;  Laterality: Right;  . I & D EXTREMITY Right 11/22/2018   Procedure: IRRIGATION AND DEBRIDEMENT AND PINNING RIGHT HAND;  Surgeon: [name], MD;  Location: [facility];  Service: Orthopedics;  Laterality: Right;  . IR RADIOLOGIST EVAL & MGMT  07/05/2018  . POLYPECTOMY  05/10/2019   Procedure: POLYPECTOMY;  Surgeon: Danie Binder, MD;  [facility];  Service: Endoscopy;;  . SKIN SPLIT GRAFT Bilateral 09/28/2017   Procedure: BILATERAL SPLIT THICKNESS SKIN GRAFT LEGS/FEET AND APPLY VAC;  Surgeon: Newt Minion, MD;  Location: [facility];  Service: Orthopedics;  Laterality: Bilateral;  . SKIN SPLIT GRAFT Right 11/22/2018   Procedure: SKIN GRAFT SPLIT THICKNESS;  Surgeon: [name], MD;  Location: [facility];  Service: Orthopedics;  Laterality: Right;   Family History  Problem Relation Age of Onset  . Hypertension Mother   . Heart attack Mother   . Hypertension Father        CABG   . Hypertension Sister   . Hypertension Brother   . Hypertension Sister   . Cancer Sister        breast   . Arthritis Other   . Cancer Other   . Diabetes Other   . Asthma Other   . Hypertension Daughter   . Hypertension Son    Social History   Socioeconomic History  . Marital status: Widowed    Spouse name: Not on file  . Number of children: 2  . Years of education: 9  . Highest education level: 9th grade  Occupational History  . Not on file  Tobacco Use  . Smoking status: Current Every Day Smoker    Packs/day: 1.00     Years: 20.00    Pack years: 20.00    Types: Cigarettes    Last attempt to quit: 06/03/2017    Years since quitting: 2.8  . Smokeless tobacco: Never Used  Vaping Use  . Vaping Use: Never used  Substance and Sexual Activity  . Alcohol use: No    Alcohol/week: 0.0 standard drinks    Comment: Quit in 2017  . Drug use: Not Currently    Frequency: 3.0 times per week    Types: Marijuana, Cocaine    Comment: none since August 2018  . Sexual activity: Not Currently    Birth control/protection: None  Other Topics Concern  . Not on file  Social History Narrative   Now has an apartment and lives with her dog. Is going to church and stopped all the "bad" stuff. Is "living for the [place]". Doing much better. Has a great outlook on life.Is much happier now.    Social Determinants of Health   Financial Resource Strain: High Risk  . Difficulty of Paying Living Expenses: Very hard  Food Insecurity: Food Insecurity Present  . Worried About Charity fundraiser in the Last Year: Sometimes true  . Ran Out of Food in the Last Year: Sometimes true  Transportation Needs: Unmet Transportation Needs  . Lack of Transportation (Medical): Yes  . Lack of Transportation (Non-Medical): Yes  Physical Activity: Inactive  . Days of Exercise per Week: 0 days  . Minutes of Exercise per Session: 0 min  Stress: Stress Concern Present  . Feeling of Stress : Rather much  Social Connections: Moderately Isolated  . Frequency of Communication with Friends and Family: More than three times a week  . Frequency of Social Gatherings with Friends and Family: More than three times a week  . Attends Religious Services: More than 4 times per year  . Active Member of Clubs or Organizations: No  . Attends Archivist Meetings: Never  . Marital Status: Widowed    Tobacco Counseling Ready to quit: Yes Counseling given: Yes   Clinical Intake:  Pre-visit preparation completed: Yes  Pain : No/denies pain  Pain  Score: 0-No pain     Nutritional Risks: None Diabetes: Yes CBG done?: No Did pt. bring in CBG monitor from home?: No  How often do you need to have someone help you when you read instructions, pamphlets, or other written materials from your doctor or pharmacy?: 1 - Never What is the last grade level you completed in school?: 9  Diabetic  Interpreter Needed?: No      Activities of Daily Living In your present state of health, do you have any difficulty performing the following activities: 04/18/2020 06/01/2019  Hearing? N N  Vision? N N  Difficulty concentrating or making decisions? N N  Walking or climbing stairs? Y Y  Dressing or bathing? Y Y  Comment - Reports occassionally requiring assistance.  Doing errands, shopping? [name]  Preparing Food and eating ? [name]  Comment - Preparing food.  Using the Toilet? N Y  In the past six months, have you accidently leaked urine? Y Y  Do you have problems with loss of bowel control? N Y  Managing your Medications? Y N  Comment doespill pack -  Managing your Finances? N N  Housekeeping or managing your Housekeeping? [name]  Some recent data might be hidden    Patient Care Team: [name], MD as PCP - General Herminio Commons, MD as PCP - Cardiology (Cardiology) Madelin Headings, DO (Optometry) Garwin Brothers, MD as Physician Assistant (Internal Medicine)  Indicate any recent Medical Services you may have received from other than [facility] providers in the past year (date may be approximate).     Assessment:   This is a routine wellness examination for [place].  Hearing/Vision screen No exam data present  Dietary issues and exercise activities discussed: Current Exercise Habits: The patient does not participate in regular exercise at present, Exercise limited by: Other - see comments (pt would like to do physical therapy)  Goals    . <enter goal here>     "my main problems are my breathing and my diabetes"    . Exercise 3x  per week (30 min per time)     Recommend exercising 3 times a week for at least 30 minutes at a time.     . Increase physical activity     Wants to be independent and do physical therapy in the future    . Quit Smoking     Would like to quit smoking this year       Depression Screen PHQ 2/9 Scores 04/18/2020 09/13/2019 07/07/2019 06/01/2019 05/24/2019 05/02/2019 03/27/2019  PHQ - 2 Score '6 3 1 1 3 1 ' 0  PHQ- 9 Score 11 6 - - 5 - 0    Fall Risk Fall Risk  04/18/2020 09/13/2019 06/01/2019 05/24/2019 03/27/2019  Falls in the past year? 0 '1 1 1 1  ' Number falls in past yr: 0 '1 1 1 ' 0  Injury with Fall? 0 0 0 0 1  Risk for fall due to : No Fall Risks - Impaired balance/gait;Medication side effect - -  Follow up Falls evaluation completed - Falls prevention discussed - -    Any stairs in or around the home? No  If so, are there any without handrails? Yes  Home free of loose throw rugs in walkways, pet beds, electrical cords, etc? No  Adequate lighting in your home to reduce risk of falls? Yes   ASSISTIVE DEVICES UTILIZED TO PREVENT FALLS:  Life alert? Yes  Use of a cane,  walker or w/c? Yes  Grab bars in the bathroom? Yes  Shower chair or bench in shower? Yes  Elevated toilet seat or a handicapped toilet? No   TIMED UP AND GO:  Was the test performed? n/a Length of time to ambulate 10 feet: sec.    Cognitive Function:     6CIT Screen 04/18/2020 02/21/2019 02/16/2018 12/15/2016  What Year? 0 points 0 points 0 points 0 points  What month? 0 points 0 points 0 points 0 points  What time? 0 points 0 points 0 points 0 points  Count back from 20 0 points 0 points 0 points 0 points  Months in reverse 0 points 0 points 0 points 0 points  Repeat phrase 0 points 0 points 0 points 0 points  Total Score 0 0 0 0    Immunizations Immunization History  Administered Date(s) Administered  . H1N1 11/07/2008  . Influenza Split 07/12/2012  . Influenza Whole 07/03/2007, 07/13/2007, 07/24/2008, 09/26/2009,  07/17/2010  . Influenza,inj,Quad PF,6+ Mos 08/28/2013, 09/10/2014, 09/03/2015, 08/31/2016, 07/26/2018, 08/03/2019  . Pneumococcal Polysaccharide-23 08/25/2006, 11/12/2012  . Td 09/08/2005  . Tdap 03/15/2012    TDAP status: Up to date Flu Vaccine status: Up to date Pneumococcal vaccine status: Up to date Covid-19 vaccine status: Declined, Education has been provided regarding the importance of this vaccine but patient still declined. Advised may receive this vaccine at local pharmacy or Health Dept.or vaccine clinic. Aware to provide a copy of the vaccination record if obtained from local pharmacy or Health Dept. Verbalized acceptance and understanding.  Qualifies for Shingles Vaccine? Yes   Zostavax completed No   Shingrix Completed?: No.    Education has been provided regarding the importance of this vaccine. Patient has been advised to call insurance company to determine out of pocket expense if they have not yet received this vaccine. Advised may also receive vaccine at local pharmacy or Health Dept. Verbalized acceptance and understanding.  Screening Tests Health Maintenance  Topic Date Due  . COVID-19 Vaccine (1) Never done  . OPHTHALMOLOGY EXAM  03/16/2018  . PAP SMEAR-Modifier  05/08/2019  . FOOT EXAM  09/06/2019  . HEMOGLOBIN A1C  11/09/2019  . MAMMOGRAM  02/18/2020  . INFLUENZA VACCINE  05/26/2020  . TETANUS/TDAP  03/15/2022  . COLONOSCOPY  05/09/2029  . PNEUMOCOCCAL POLYSACCHARIDE VACCINE AGE 77-64 HIGH RISK  Completed  . Hepatitis C Screening  Completed  . HIV Screening  Completed    Health Maintenance  Health Maintenance Due  Topic Date Due  . COVID-19 Vaccine (1) Never done  . OPHTHALMOLOGY EXAM  03/16/2018  . PAP SMEAR-Modifier  05/08/2019  . FOOT EXAM  09/06/2019  . HEMOGLOBIN A1C  11/09/2019  . MAMMOGRAM  02/18/2020    Colorectal cancer screening: Completed 04/2019. Repeat every 10 years Mammogram status: Completed 01/2020. Repeat every year   Lung Cancer  Screening: (Low Dose CT Chest recommended if Age 74-80 years, 30 pack-year currently smoking OR have quit w/in 15years.) does qualify.   Lung Cancer Screening Referral:   Additional Screening:  Hepatitis C Screening: does qualify; Completed   Vision Screening: Recommended annual ophthalmology exams for early detection of glaucoma and other disorders of the eye. Is the patient up to date with their annual eye exam?  No needs appt will call  Who is the provider or what is the name of the office in which the patient attends annual eye exams? [facility] If pt is not established with a provider, would they like to be  referred to a provider to establish care? No .   Dental Screening: Recommended annual dental exams for proper oral hygiene  Community Resource Referral / Chronic Care Management: CRR required this visit?  No   CCM required this visit?  No      Plan:     1. Encounter for Medicare annual wellness exam   I have personally reviewed and noted the following in the patient's chart:   . Medical and social history . Use of alcohol, tobacco or illicit drugs  . Current medications and supplements . Functional ability and status . Nutritional status . Physical activity . Advanced directives . List of other physicians . Hospitalizations, surgeries, and ER visits in previous 12 months . Vitals . Screenings to include cognitive, depression, and falls . Referrals and appointments  In addition, I have reviewed and discussed with patient certain preventive protocols, quality metrics, and best practice recommendations. A written personalized care plan for preventive services as well as general preventive health recommendations were provided to patient.    I provided 20 minutes of non-face-to-face time during this encounter.    Perlie Mayo, NP   04/18/2020   Nurse Notes: n/a

## 2020-04-18 NOTE — Progress Notes (Deleted)
Subjective:   [name] is a 54 y.o. female who presents for Medicare Annual (Subsequent) preventive examination.  Review of Systems    *** Cardiac Risk Factors include: hypertension;diabetes mellitus;sedentary lifestyle;smoking/ tobacco exposure     Objective:    Today's Vitals   04/18/20 1442 04/18/20 1443  BP: (!) 160/88   Weight: 182 lb (82.6 kg)   Height: '5\' 1"'  (1.549 m)   PainSc: 0-No pain 0-No pain   Body mass index is 34.39 kg/m.  Advanced Directives 04/18/2020 05/09/2019 11/23/2018 11/23/2018 10/13/2018 10/11/2018 07/12/2018  Does Patient Have a Medical Advance Directive? No No No No No No No  Type of Advance Directive - - - - - - -  Does patient want to make changes to medical advance directive? - - - - - - -  Would patient like information on creating a medical advance directive? Yes (MAU/Ambulatory/Procedural Areas - Information given) No - Patient declined No - Patient declined No - Patient declined No - Patient declined No - Patient declined No - Patient declined  Pre-existing out of facility DNR order (yellow form or pink MOST form) - - - - - - -  Some encounter information is confidential and restricted. Go to Review Flowsheets activity to see all data.    Current Medications (verified) Outpatient Encounter Medications as of 04/18/2020  Medication Sig  . albuterol (PROAIR HFA) 108 (90 Base) MCG/ACT inhaler INHALE 2 PUFFS EVERY 6 HOURS AS NEEDED FOR SHORTNESS OF BREATH/WHEEZING.  Marland Kitchen amLODipine (NORVASC) 10 MG tablet TAKE 1 TABLET BY MOUTH ONCE A DAY.  Marland Kitchen apixaban (ELIQUIS) 5 MG TABS tablet Take 5 mg by mouth 2 (two) times daily.  . blood glucose meter kit and supplies Dispense based on patient and insurance preference. Use to test blood sugar once daily (FOR ICD-10 E10.9, E11.9).  . cloNIDine (CATAPRES) 0.3 MG tablet TAKE 1 TABLET BY MOUTH AT BEDTIME FOR BLOOD PRESSURE  . clopidogrel (PLAVIX) 75 MG tablet TAKE 1 TABLET BY MOUTH DAILY WITH BREAKFAST.  .  DULoxetine (CYMBALTA) 30 MG capsule TAKE 2 CAPSULES BY MOUTH ONCE DAILY.  Marland Kitchen ezetimibe (ZETIA) 10 MG tablet TAKE 1 TABLET BY MOUTH ONCE A DAY.  . furosemide (LASIX) 20 MG tablet TAKE 1 TABLET BY MOUTH ONCE A DAY.  Marland Kitchen gabapentin (NEURONTIN) 300 MG capsule TAKE 1 CAPSULE BY MOUTH THREE TIMES A DAY.  . hydrOXYzine (ATARAX/VISTARIL) 25 MG tablet TAKE 1 TABLET BY MOUTH AT BEDTIME FOR ITCHING AND SLEEP.  Marland Kitchen metoprolol succinate (TOPROL-XL) 50 MG 24 hr tablet TAKE 1 TABLET BY MOUTH TWICE A DAY.  . montelukast (SINGULAIR) 10 MG tablet TAKE (1) TABLET BY MOUTH AT BEDTIME.  Marland Kitchen olmesartan (BENICAR) 40 MG tablet TAKE 1 TABLET BY MOUTH ONCE A DAY.  . pantoprazole (PROTONIX) 40 MG tablet TAKE 1 TABLET BY MOUTH ONCE A DAY.  Marland Kitchen potassium chloride SA (KLOR-CON) 20 MEQ tablet TAKE 1&1/2 TABLET BY MOUTH DAILY.  . rosuvastatin (CRESTOR) 10 MG tablet TAKE 1 TABLET BY MOUTH ONCE A DAY.  . solifenacin (VESICARE) 5 MG tablet TAKE 1 TABLET BY MOUTH ONCE DAILY.  Marland Kitchen tiotropium (SPIRIVA HANDIHALER) 18 MCG inhalation capsule Place 18 mcg into inhaler and inhale daily as needed (SHORTNESS OF BREATH).   . TRADJENTA 5 MG TABS tablet TAKE 1 TABLET BY MOUTH ONCE DAILY.  . traZODone (DESYREL) 50 MG tablet TAKE (1) TABLET BY MOUTH AT BEDTIME.  . [DISCONTINUED] benzonatate (TESSALON) 100 MG capsule Take 1 capsule (100 mg total) by mouth 3 (  three) times daily as needed for cough.  . [DISCONTINUED] dextromethorphan (DELSYM) 30 MG/5ML liquid Take 5 mLs (30 mg total) by mouth as needed for cough. (Patient not taking: Reported on 04/18/2020)  . [DISCONTINUED] ipratropium-albuterol (DUONEB) 0.5-2.5 (3) MG/3ML SOLN Take 3 mLs by nebulization every 6 (six) hours as needed (wheezes or SOB). (Patient not taking: Reported on 04/18/2020)  . [DISCONTINUED] traMADol (ULTRAM) 50 MG tablet Take by mouth every 8 (eight) hours as needed. (Patient not taking: Reported on 04/18/2020)   No facility-administered encounter medications on file as of 04/18/2020.     Allergies (verified) Patient has no known allergies.   History: Past Medical History:  Diagnosis Date  . Alcohol use   . Ankle fracture, lateral malleolus, closed 12/30/2011  . Anxiety   . Breast mass, left 12/15/2011  . Chronic anemia   . Chronic combined systolic and diastolic CHF (congestive heart failure) ([place])   . CKD (chronic kidney disease), stage III    pt denies  . Cocaine abuse ([place])   . COPD (chronic obstructive pulmonary disease) ([place])   . Diabetes mellitus, type 2 ([place])   . Diabetic Charcot foot ([place]) 12/30/2011  . Essential hypertension   . History of GI bleed 12/05/2009   Qualifier: Diagnosis of  By: Zeb Comfort    . Hyperlipidemia   . Hypertensive cardiomyopathy ([place]) 11/08/2012   a. 05/2016: echo showing EF of 20-25% with normal cors by cath in 07/2016.  . Morbid obesity ([place]) 01/31/2008   Qualifier: Diagnosis of  By: Lenn Cal    . Noncompliance with medication regimen   . Obesity   . Panic attacks   . PAT (paroxysmal atrial tachycardia) ([place])    a. started on amiodarone 07/2017 for this.  . Sleep apnea    does not use cpap  . Stroke ([place])    06/24/17  . Tobacco abuse   . Urinary incontinence    Past Surgical History:  Procedure Laterality Date  . BREAST BIOPSY    . CARDIAC CATHETERIZATION N/A 07/28/2016   Procedure: Left Heart Cath and Coronary Angiography;  Surgeon: Jettie Booze, MD;  Location: [facility];  Service: Cardiovascular;  Laterality: N/A;  . COLONOSCOPY N/A 05/10/2019   Procedure: COLONOSCOPY;  Surgeon: Danie Binder, MD;  [facility];  Service: Endoscopy;  Laterality: N/A;  Phenergan 12.5 mg IV in pre-op  . DILATION AND CURETTAGE OF UTERUS    . I & D EXTREMITY Bilateral 09/22/2017   Procedure: BILATERAL DEBRIDEMENT LEG/FOOT ULCERS, APPLY VERAFLO WOUND VAC;  Surgeon: Newt Minion, MD;  Location: [facility];  Service: Orthopedics;  Laterality: Bilateral;  . I & D EXTREMITY Right 10/11/2018   Procedure:  IRRIGATION AND DEBRIDEMENT RIGHT HAND;  Surgeon: [name], MD;  Location: [facility];  Service: Orthopedics;  Laterality: Right;  . I & D EXTREMITY Right 10/13/2018   Procedure: REPEAT IRRIGATION AND DEBRIDEMENT RIGHT HAND;  Surgeon: [name], MD;  Location: [facility];  Service: Orthopedics;  Laterality: Right;  . I & D EXTREMITY Right 11/22/2018   Procedure: IRRIGATION AND DEBRIDEMENT AND PINNING RIGHT HAND;  Surgeon: [name], MD;  Location: [facility];  Service: Orthopedics;  Laterality: Right;  . IR RADIOLOGIST EVAL & MGMT  07/05/2018  . POLYPECTOMY  05/10/2019   Procedure: POLYPECTOMY;  Surgeon: Danie Binder, MD;  [facility];  Service: Endoscopy;;  . SKIN SPLIT GRAFT Bilateral 09/28/2017   Procedure: BILATERAL SPLIT THICKNESS SKIN GRAFT LEGS/FEET AND APPLY  VAC;  Surgeon: Newt Minion, MD;  Location: [facility];  Service: Orthopedics;  Laterality: Bilateral;  . SKIN SPLIT GRAFT Right 11/22/2018   Procedure: SKIN GRAFT SPLIT THICKNESS;  Surgeon: [name], MD;  Location: [facility];  Service: Orthopedics;  Laterality: Right;   Family History  Problem Relation Age of Onset  . Hypertension Mother   . Heart attack Mother   . Hypertension Father        CABG   . Hypertension Sister   . Hypertension Brother   . Hypertension Sister   . Cancer Sister        breast   . Arthritis Other   . Cancer Other   . Diabetes Other   . Asthma Other   . Hypertension Daughter   . Hypertension Son    Social History   Socioeconomic History  . Marital status: Widowed    Spouse name: Not on file  . Number of children: 2  . Years of education: 9  . Highest education level: 9th grade  Occupational History  . Not on file  Tobacco Use  . Smoking status: Current Every Day Smoker    Packs/day: 1.00    Years: 20.00    Pack years: 20.00    Types: Cigarettes    Last attempt to quit: 06/03/2017    Years since quitting: 2.8  . Smokeless tobacco: Never Used  Vaping Use  . Vaping Use:  Never used  Substance and Sexual Activity  . Alcohol use: No    Alcohol/week: 0.0 standard drinks    Comment: Quit in 2017  . Drug use: Not Currently    Frequency: 3.0 times per week    Types: Marijuana, Cocaine    Comment: none since August 2018  . Sexual activity: Not Currently    Birth control/protection: None  Other Topics Concern  . Not on file  Social History Narrative   Now has an apartment and lives with her dog. Is going to church and stopped all the "bad" stuff. Is "living for the [place]". Doing much better. Has a great outlook on life.Is much happier now.    Social Determinants of Health   Financial Resource Strain: High Risk  . Difficulty of Paying Living Expenses: Very hard  Food Insecurity: Food Insecurity Present  . Worried About Charity fundraiser in the Last Year: Sometimes true  . Ran Out of Food in the Last Year: Sometimes true  Transportation Needs: Unmet Transportation Needs  . Lack of Transportation (Medical): Yes  . Lack of Transportation (Non-Medical): Yes  Physical Activity: Inactive  . Days of Exercise per Week: 0 days  . Minutes of Exercise per Session: 0 min  Stress: Stress Concern Present  . Feeling of Stress : Rather much  Social Connections: Moderately Isolated  . Frequency of Communication with Friends and Family: More than three times a week  . Frequency of Social Gatherings with Friends and Family: More than three times a week  . Attends Religious Services: More than 4 times per year  . Active Member of Clubs or Organizations: No  . Attends Archivist Meetings: Never  . Marital Status: Widowed    Tobacco Counseling Ready to quit: Yes Counseling given: Yes   Clinical Intake:  Pre-visit preparation completed: Yes  Pain : No/denies pain Pain Score: 0-No pain     Nutritional Risks: None Diabetes: Yes CBG done?: No Did pt. bring in CBG monitor from home?: No  How often do you need  to have someone help you when you read  instructions, pamphlets, or other written materials from your doctor or pharmacy?: 1 - Never What is the last grade level you completed in school?: 9  Diabetic?***  Interpreter Needed?: No      Activities of Daily Living In your present state of health, do you have any difficulty performing the following activities: 04/18/2020 06/01/2019  Hearing? N N  Vision? N N  Difficulty concentrating or making decisions? N N  Walking or climbing stairs? Y Y  Dressing or bathing? Y Y  Comment - Reports occassionally requiring assistance.  Doing errands, shopping? [name]  Preparing Food and eating ? [name]  Comment - Preparing food.  Using the Toilet? N Y  In the past six months, have you accidently leaked urine? Y Y  Do you have problems with loss of bowel control? N Y  Managing your Medications? Y N  Comment doespill pack -  Managing your Finances? N N  Housekeeping or managing your Housekeeping? [name]  Some recent data might be hidden    Patient Care Team: [name], MD as PCP - General Herminio Commons, MD as PCP - Cardiology (Cardiology) Madelin Headings, DO (Optometry) Garwin Brothers, MD as Physician Assistant (Internal Medicine)  Indicate any recent Medical Services you may have received from other than [facility] providers in the past year (date may be approximate).     Assessment:   This is a routine wellness examination for [place].  Hearing/Vision screen No exam data present  Dietary issues and exercise activities discussed: Current Exercise Habits: The patient does not participate in regular exercise at present, Exercise limited by: Other - see comments (pt would like to do physical therapy)  Goals    . <enter goal here>     "my main problems are my breathing and my diabetes"    . Exercise 3x per week (30 min per time)     Recommend exercising 3 times a week for at least 30 minutes at a time.     . Increase physical activity     Wants to be independent and do physical  therapy in the future    . Quit Smoking     Would like to quit smoking this year       Depression Screen PHQ 2/9 Scores 04/18/2020 09/13/2019 07/07/2019 06/01/2019 05/24/2019 05/02/2019 03/27/2019  PHQ - 2 Score '6 3 1 1 3 1 ' 0  PHQ- 9 Score 11 6 - - 5 - 0    Fall Risk Fall Risk  04/18/2020 09/13/2019 06/01/2019 05/24/2019 03/27/2019  Falls in the past year? 0 '1 1 1 1  ' Number falls in past yr: 0 '1 1 1 ' 0  Injury with Fall? 0 0 0 0 1  Risk for fall due to : No Fall Risks - Impaired balance/gait;Medication side effect - -  Follow up Falls evaluation completed - Falls prevention discussed - -    Any stairs in or around the home? {YES/NO:21197} If so, are there any without handrails? {YES/NO:21197} Home free of loose throw rugs in walkways, pet beds, electrical cords, etc? {YES/NO:21197} Adequate lighting in your home to reduce risk of falls? {YES/NO:21197}  ASSISTIVE DEVICES UTILIZED TO PREVENT FALLS:  Life alert? {YES/NO:21197} Use of a cane, walker or w/c? {YES/NO:21197} Grab bars in the bathroom? {YES/NO:21197} Shower chair or bench in shower? {YES/NO:21197} Elevated toilet seat or a handicapped toilet? {YES/NO:21197}  TIMED UP AND GO:  Was the test performed? {YES/NO:21197}.  Length of time to ambulate 10 feet: *** sec.   {Appearance of CLEX:5170017}  Cognitive Function:     6CIT Screen 04/18/2020 02/21/2019 02/16/2018 12/15/2016  What Year? 0 points 0 points 0 points 0 points  What month? 0 points 0 points 0 points 0 points  What time? 0 points 0 points 0 points 0 points  Count back from 20 0 points 0 points 0 points 0 points  Months in reverse 0 points 0 points 0 points 0 points  Repeat phrase 0 points 0 points 0 points 0 points  Total Score 0 0 0 0    Immunizations Immunization History  Administered Date(s) Administered  . H1N1 11/07/2008  . Influenza Split 07/12/2012  . Influenza Whole 07/03/2007, 07/13/2007, 07/24/2008, 09/26/2009, 07/17/2010  . Influenza,inj,Quad PF,6+  Mos 08/28/2013, 09/10/2014, 09/03/2015, 08/31/2016, 07/26/2018, 08/03/2019  . Pneumococcal Polysaccharide-23 08/25/2006, 11/12/2012  . Td 09/08/2005  . Tdap 03/15/2012    {TDAP status:2101805} {Flu Vaccine status:2101806} {Pneumococcal vaccine status:2101807} {Covid-19 vaccine status:2101808}  Qualifies for Shingles Vaccine? {YES/NO:21197}  Zostavax completed {YES/NO:21197}  {Shingrix Completed?:2101804}  Screening Tests Health Maintenance  Topic Date Due  . COVID-19 Vaccine (1) Never done  . OPHTHALMOLOGY EXAM  03/16/2018  . PAP SMEAR-Modifier  05/08/2019  . FOOT EXAM  09/06/2019  . HEMOGLOBIN A1C  11/09/2019  . MAMMOGRAM  02/18/2020  . INFLUENZA VACCINE  05/26/2020  . TETANUS/TDAP  03/15/2022  . COLONOSCOPY  05/09/2029  . PNEUMOCOCCAL POLYSACCHARIDE VACCINE AGE 40-64 HIGH RISK  Completed  . Hepatitis C Screening  Completed  . HIV Screening  Completed    Health Maintenance  Health Maintenance Due  Topic Date Due  . COVID-19 Vaccine (1) Never done  . OPHTHALMOLOGY EXAM  03/16/2018  . PAP SMEAR-Modifier  05/08/2019  . FOOT EXAM  09/06/2019  . HEMOGLOBIN A1C  11/09/2019  . MAMMOGRAM  02/18/2020    {Colorectal cancer screening:2101809} {Mammogram status:21018020} {Bone Density status:21018021}  Lung Cancer Screening: (Low Dose CT Chest recommended if Age 2-80 years, 30 pack-year currently smoking OR have quit w/in 15years.) {DOES NOT does:27190::"does not"} qualify.   Lung Cancer Screening Referral: ***  Additional Screening:  Hepatitis C Screening: {DOES NOT does:27190::"does not"} qualify; Completed ***  Vision Screening: Recommended annual ophthalmology exams for early detection of glaucoma and other disorders of the eye. Is the patient up to date with their annual eye exam?  {YES/NO:21197} Who is the provider or what is the name of the office in which the patient attends annual eye exams? *** If pt is not established with a provider, would they like to be  referred to a provider to establish care? {YES/NO:21197}.   Dental Screening: Recommended annual dental exams for proper oral hygiene  Community Resource Referral / Chronic Care Management: CRR required this visit?  {YES/NO:21197}  CCM required this visit?  {YES/NO:21197}     Plan:     I have personally reviewed and noted the following in the patient's chart:   . Medical and social history . Use of alcohol, tobacco or illicit drugs  . Current medications and supplements . Functional ability and status . Nutritional status . Physical activity . Advanced directives . List of other physicians . Hospitalizations, surgeries, and ER visits in previous 12 months . Vitals . Screenings to include cognitive, depression, and falls . Referrals and appointments  In addition, I have reviewed and discussed with patient certain preventive protocols, quality metrics, and best practice recommendations. A written personalized care plan for preventive services as well as general  preventive health recommendations were provided to patient.     Shelda Altes, CMA   04/18/2020   Nurse Notes: ***

## 2020-05-03 IMAGING — DX DG HAND COMPLETE 3+V*R*
3 series · 3 of 3 positions shown · non-contrast
Comparison: None.

CLINICAL DATA: Dog bite.

EXAM:
RIGHT HAND - COMPLETE 3+ VIEW

[hand pa]
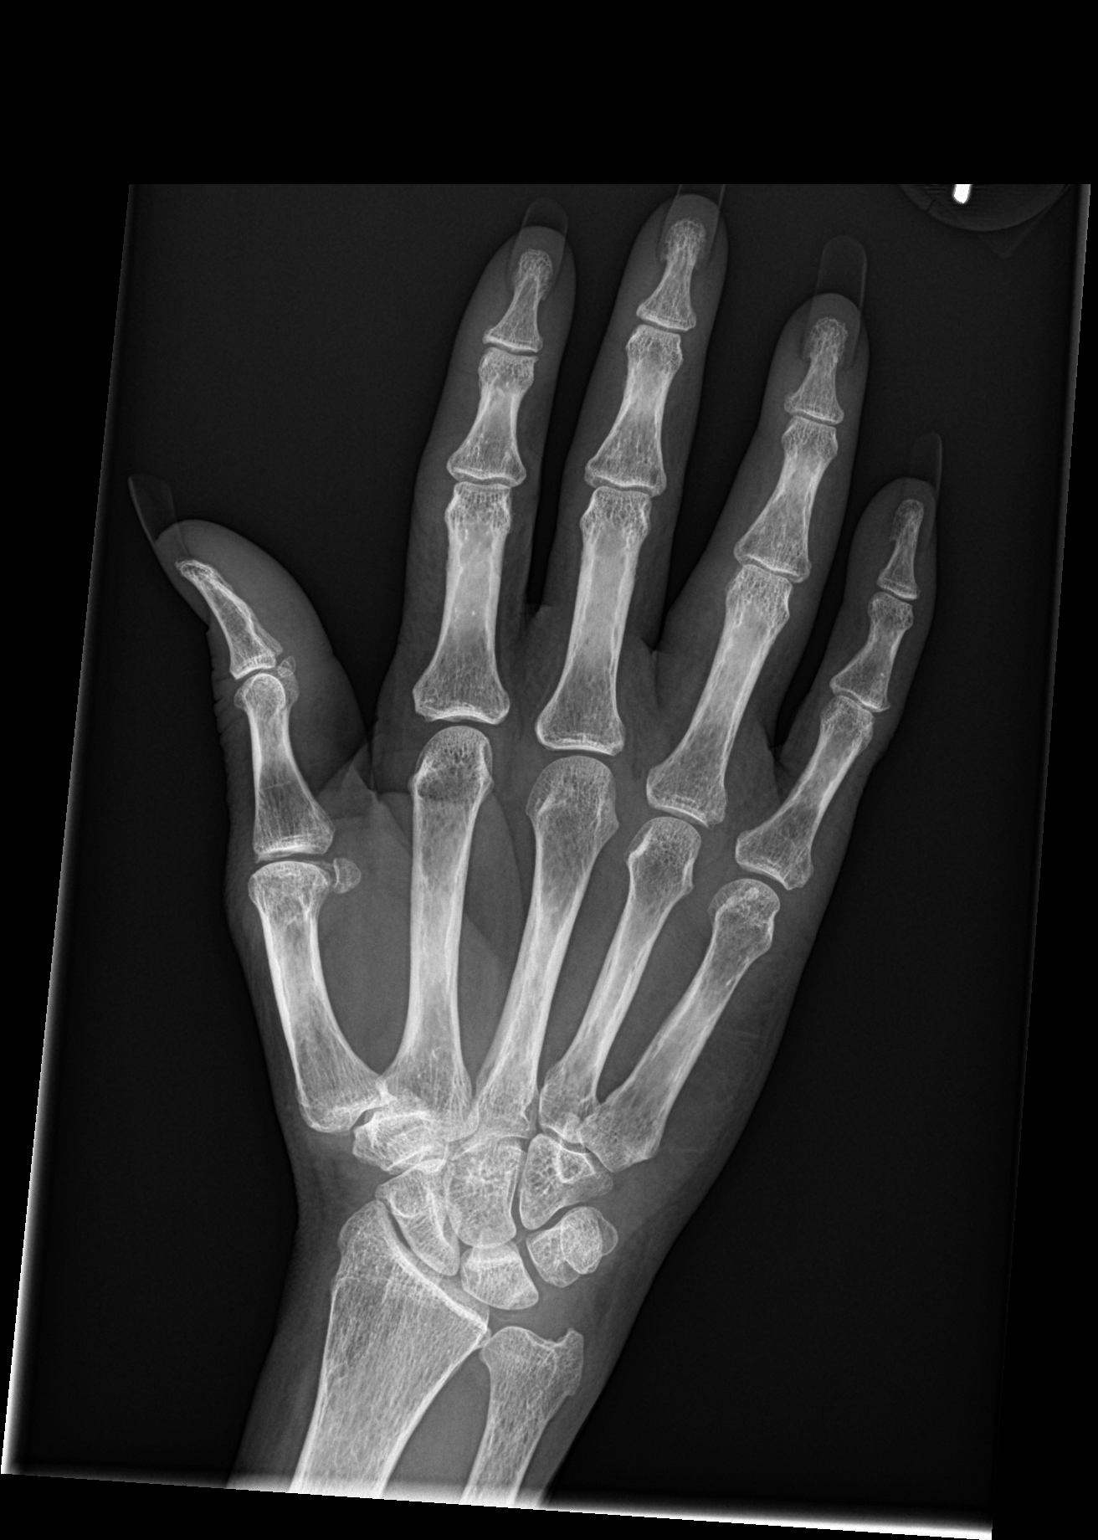

[hand obl]
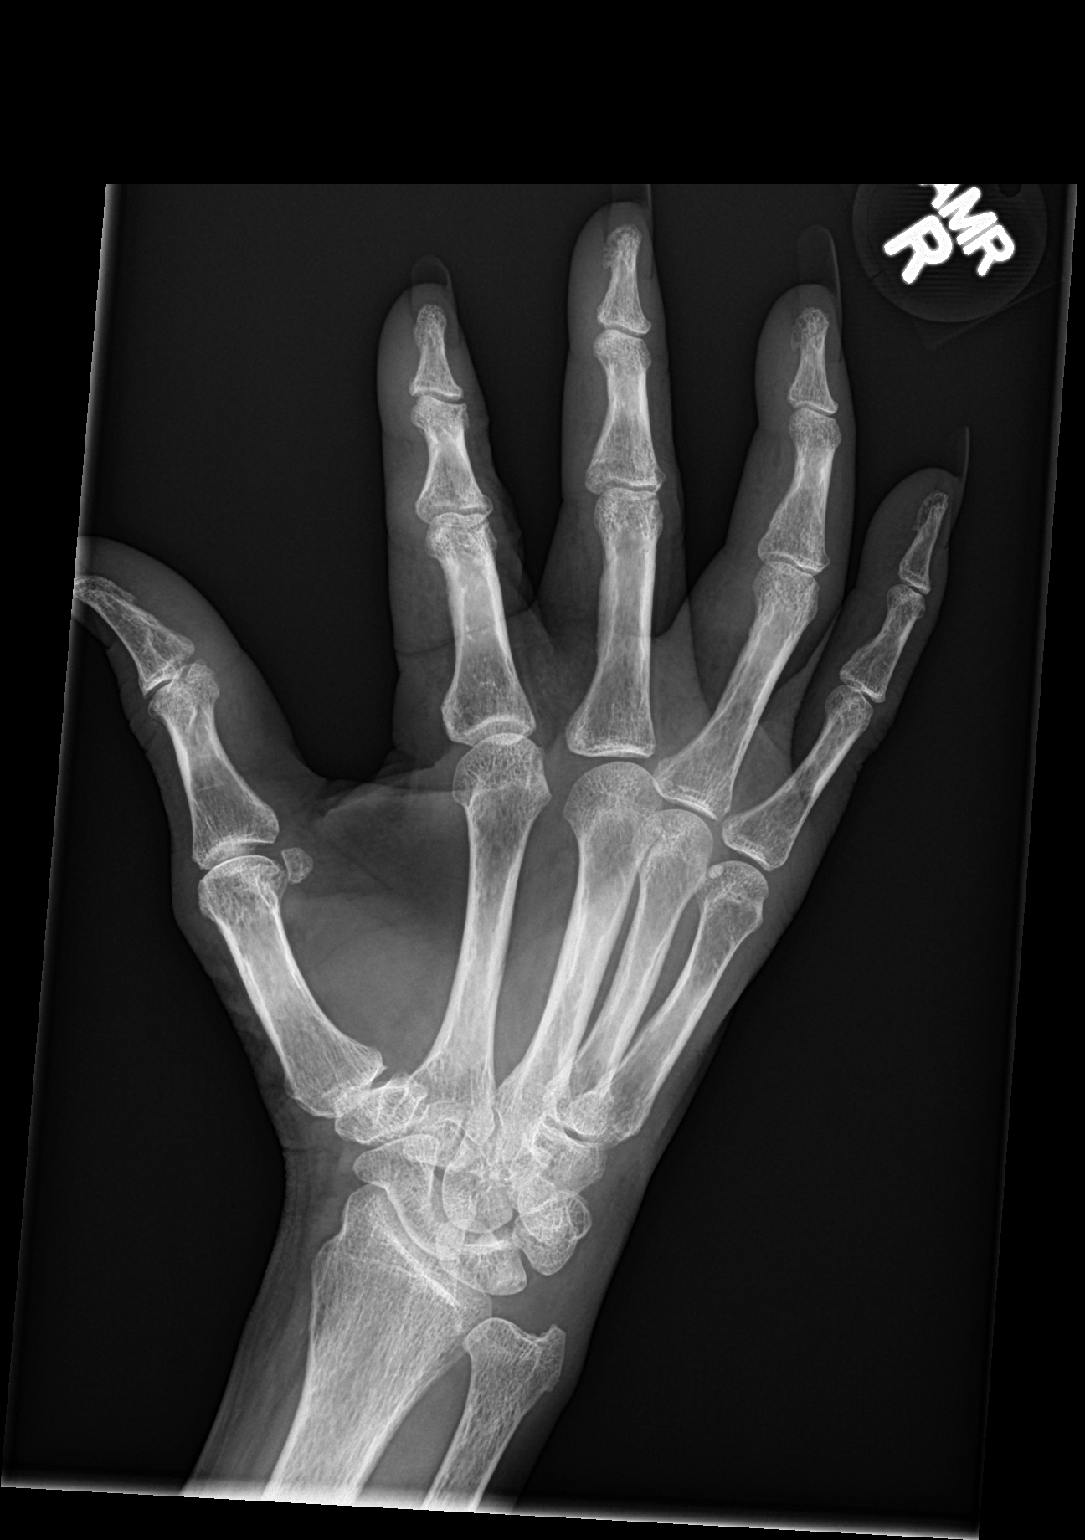

[hand lat]
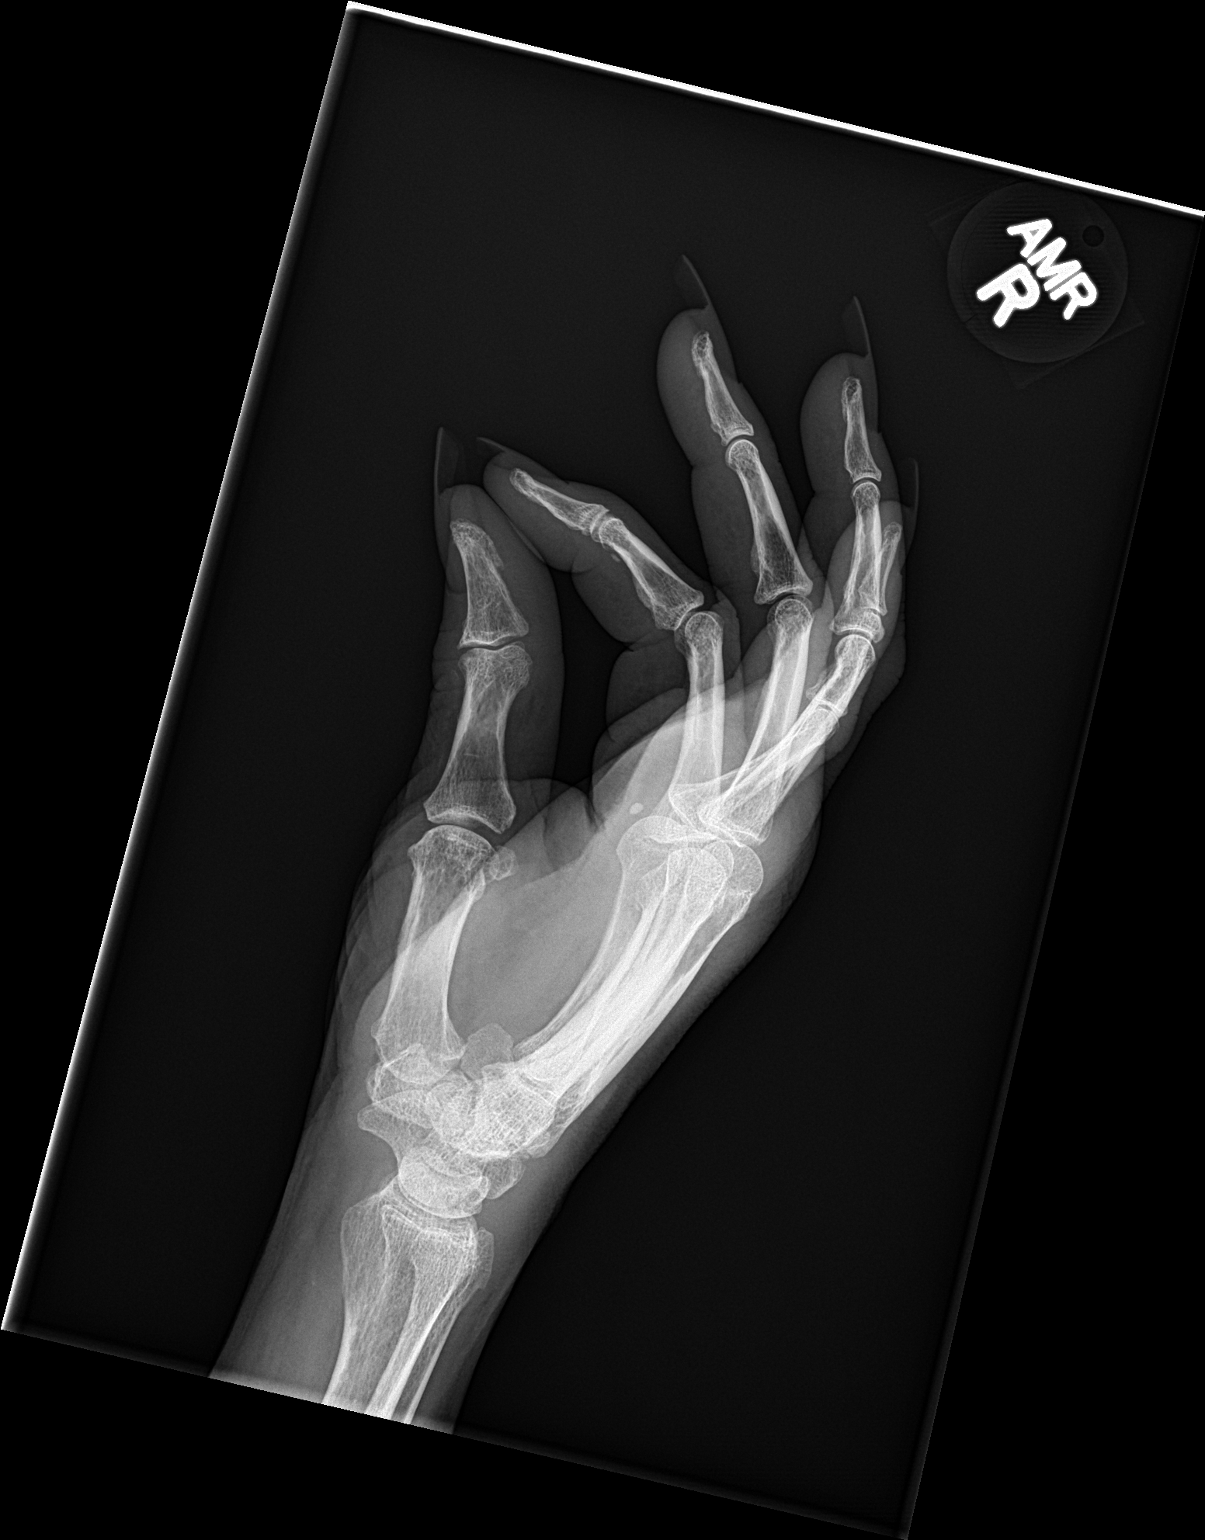

[3 of 3 positions shown; findings below may reference images not displayed]

FINDINGS: Generalized osteopenia. No acute fracture or dislocation. Mild
osteoarthritis of the first CMC joint. Mild osteoarthritis of the
first IP joint. No soft tissue abnormality.
IMPRESSION: No acute osseous injury of the right hand.

## 2020-05-08 ENCOUNTER — Other Ambulatory Visit: Payer: Self-pay | Admitting: Family Medicine

## 2020-05-12 DIAGNOSIS — I509 Heart failure, unspecified: Secondary | ICD-10-CM | POA: Diagnosis not present

## 2020-05-12 DIAGNOSIS — R062 Wheezing: Secondary | ICD-10-CM | POA: Diagnosis not present

## 2020-05-14 ENCOUNTER — Encounter (HOSPITAL_COMMUNITY): Payer: Self-pay

## 2020-05-14 ENCOUNTER — Other Ambulatory Visit (HOSPITAL_COMMUNITY): Payer: Self-pay

## 2020-05-14 DIAGNOSIS — Z87891 Personal history of nicotine dependence: Secondary | ICD-10-CM

## 2020-05-14 DIAGNOSIS — Z122 Encounter for screening for malignant neoplasm of respiratory organs: Secondary | ICD-10-CM

## 2020-05-14 NOTE — Progress Notes (Signed)
Orders placed for LDCT 

## 2020-05-14 NOTE — Progress Notes (Signed)
Received referral for initial lung cancer screening scan. Contacted patient and obtained smoking history (started age 54 smoking 0.25ppd until age 8 when she smoked 0.5ppd until age 12 when she started smoking 1ppd until the age of 79, since the age of 3 she has smoked 1.5ppd, current smoker, 42.5 pack year history) as well as answering questions related to the screening process. Patient denies signs and symptoms of lung cancer such as weight loss or hemoptysis. Patient denies comorbidity that would prevent curative treatment if lung cancer were to be found. Patient is scheduled for shared decision making visit and CT scan on 05/31/2020 at 1030.

## 2020-05-14 NOTE — Progress Notes (Signed)
Patient was referred to Lung Cancer Screening Program by Cherly Beach, NP. Attempted to contact patient today but her phone is not in service. Left a message with her sister, Junious Dresser, for her to call us.

## 2020-05-31 ENCOUNTER — Ambulatory Visit (HOSPITAL_COMMUNITY): Payer: Medicare Other

## 2020-05-31 ENCOUNTER — Ambulatory Visit (HOSPITAL_COMMUNITY): Payer: Medicare Other | Admitting: Nurse Practitioner

## 2020-06-05 ENCOUNTER — Encounter (HOSPITAL_COMMUNITY): Payer: Self-pay

## 2020-06-05 ENCOUNTER — Other Ambulatory Visit (HOSPITAL_COMMUNITY): Payer: Self-pay

## 2020-06-05 NOTE — Progress Notes (Signed)
I called and spoke with the patient today about the Lung Cancer Screening Program. Patient states that she was unable to make her appt due to transportation issues. The patient goes on to state that she is not interested in rescheduling at this time and will reach out should she change her mind. Dr. Moshe Cipro, referring provider, made aware. I will close the referral at this time.

## 2020-06-12 DIAGNOSIS — I509 Heart failure, unspecified: Secondary | ICD-10-CM | POA: Diagnosis not present

## 2020-06-12 DIAGNOSIS — R062 Wheezing: Secondary | ICD-10-CM | POA: Diagnosis not present

## 2020-06-24 ENCOUNTER — Other Ambulatory Visit: Payer: Self-pay | Admitting: Family Medicine

## 2020-06-24 ENCOUNTER — Telehealth: Payer: Medicare Other

## 2020-06-27 ENCOUNTER — Encounter: Payer: Medicare Other | Admitting: Family Medicine

## 2020-07-13 DIAGNOSIS — I509 Heart failure, unspecified: Secondary | ICD-10-CM | POA: Diagnosis not present

## 2020-07-13 DIAGNOSIS — R062 Wheezing: Secondary | ICD-10-CM | POA: Diagnosis not present

## 2020-07-15 ENCOUNTER — Telehealth: Payer: Medicare Other

## 2020-07-17 ENCOUNTER — Other Ambulatory Visit: Payer: Self-pay | Admitting: Family Medicine

## 2020-07-17 DIAGNOSIS — I10 Essential (primary) hypertension: Secondary | ICD-10-CM

## 2020-07-30 ENCOUNTER — Encounter: Payer: Medicare Other | Admitting: Family Medicine

## 2020-08-05 ENCOUNTER — Telehealth: Payer: Medicare Other

## 2020-08-05 ENCOUNTER — Other Ambulatory Visit: Payer: Self-pay

## 2020-08-05 ENCOUNTER — Telehealth: Payer: Self-pay | Admitting: Family Medicine

## 2020-08-05 ENCOUNTER — Telehealth: Payer: Self-pay

## 2020-08-05 VITALS — BP 160/88 | Ht 61.0 in | Wt 182.0 lb

## 2020-08-05 DIAGNOSIS — Z122 Encounter for screening for malignant neoplasm of respiratory organs: Secondary | ICD-10-CM

## 2020-08-05 DIAGNOSIS — Z0000 Encounter for general adult medical examination without abnormal findings: Secondary | ICD-10-CM

## 2020-08-05 DIAGNOSIS — Z1231 Encounter for screening mammogram for malignant neoplasm of breast: Secondary | ICD-10-CM

## 2020-08-05 MED ORDER — ALBUTEROL SULFATE HFA 108 (90 BASE) MCG/ACT IN AERS: INHALATION_SPRAY | RESPIRATORY_TRACT | 2 refills | Status: DC

## 2020-08-05 NOTE — Progress Notes (Signed)
Subjective:   [name] is a 54 y.o. female who presents for Medicare Annual (Subsequent) preventive examination.  Review of Systems    Cardiac Risk Factors include: diabetes mellitus;hypertension;obesity (BMI >30kg/m2);smoking/ tobacco exposure     Objective:    Today's Vitals   08/05/20 1354 08/05/20 1456  BP: (!) 160/88   Weight: 182 lb (82.6 kg)   Height: '5\' 1"'  (1.549 m)   PainSc: 0-No pain 0-No pain   Body mass index is 34.39 kg/m.  Advanced Directives 08/05/2020 04/18/2020 05/09/2019 11/23/2018 11/23/2018 10/13/2018 10/11/2018  Does Patient Have a Medical Advance Directive? No No No No No No No  Type of Advance Directive - - - - - - -  Does patient want to make changes to medical advance directive? - - - - - - -  Would patient like information on creating a medical advance directive? No - Patient declined Yes (MAU/Ambulatory/Procedural Areas - Information given) No - Patient declined No - Patient declined No - Patient declined No - Patient declined No - Patient declined  Pre-existing out of facility DNR order (yellow form or pink MOST form) - - - - - - -  Some encounter information is confidential and restricted. Go to Review Flowsheets activity to see all data.    Current Medications (verified) Outpatient Encounter Medications as of 08/05/2020  Medication Sig  . albuterol (PROAIR HFA) 108 (90 Base) MCG/ACT inhaler INHALE 2 PUFFS EVERY 6 HOURS AS NEEDED FOR SHORTNESS OF BREATH/WHEEZING.  Marland Kitchen amLODipine (NORVASC) 10 MG tablet TAKE 1 TABLET BY MOUTH ONCE A DAY.  Marland Kitchen apixaban (ELIQUIS) 5 MG TABS tablet Take 5 mg by mouth 2 (two) times daily.  . cloNIDine (CATAPRES) 0.3 MG tablet TAKE 1 TABLET BY MOUTH AT BEDTIME FOR BLOOD PRESSURE  . clopidogrel (PLAVIX) 75 MG tablet TAKE 1 TABLET BY MOUTH DAILY WITH BREAKFAST.  . DULoxetine (CYMBALTA) 30 MG capsule TAKE 2 CAPSULES BY MOUTH ONCE DAILY.  Marland Kitchen ezetimibe (ZETIA) 10 MG tablet TAKE 1 TABLET BY MOUTH ONCE A DAY.  . furosemide  (LASIX) 20 MG tablet TAKE 1 TABLET BY MOUTH ONCE A DAY.  Marland Kitchen gabapentin (NEURONTIN) 300 MG capsule TAKE 1 CAPSULE BY MOUTH THREE TIMES A DAY.  . hydrOXYzine (ATARAX/VISTARIL) 25 MG tablet TAKE 1 TABLET BY MOUTH AT BEDTIME FOR ITCHING AND SLEEP.  Marland Kitchen metoprolol succinate (TOPROL-XL) 50 MG 24 hr tablet TAKE 1 TABLET BY MOUTH TWICE A DAY.  . montelukast (SINGULAIR) 10 MG tablet TAKE (1) TABLET BY MOUTH AT BEDTIME.  Marland Kitchen olmesartan (BENICAR) 40 MG tablet TAKE 1 TABLET BY MOUTH ONCE A DAY.  . pantoprazole (PROTONIX) 40 MG tablet TAKE 1 TABLET BY MOUTH ONCE A DAY.  Marland Kitchen potassium chloride SA (KLOR-CON) 20 MEQ tablet TAKE 1&1/2 TABLETS (30MEQ) BY MOUTH DAILY.  . rosuvastatin (CRESTOR) 10 MG tablet TAKE 1 TABLET BY MOUTH ONCE A DAY.  . solifenacin (VESICARE) 5 MG tablet TAKE 1 TABLET BY MOUTH ONCE DAILY.  Marland Kitchen tiotropium (SPIRIVA HANDIHALER) 18 MCG inhalation capsule Place 18 mcg into inhaler and inhale daily as needed (SHORTNESS OF BREATH).   . TRADJENTA 5 MG TABS tablet TAKE 1 TABLET BY MOUTH ONCE DAILY.  . traZODone (DESYREL) 50 MG tablet TAKE (1) TABLET BY MOUTH AT BEDTIME.  . [DISCONTINUED] blood glucose meter kit and supplies Dispense based on patient and insurance preference. Use to test blood sugar once daily (FOR ICD-10 E10.9, E11.9). (Patient not taking: Reported on 08/05/2020)   No facility-administered encounter medications on file as  of 08/05/2020.    Allergies (verified) Patient has no known allergies.   History: Past Medical History:  Diagnosis Date  . Alcohol use   . Ankle fracture, lateral malleolus, closed 12/30/2011  . Anxiety   . Breast mass, left 12/15/2011  . Chronic anemia   . Chronic combined systolic and diastolic CHF (congestive heart failure) ([place])   . CKD (chronic kidney disease), stage III ([place])    pt denies  . Cocaine abuse ([place])   . COPD (chronic obstructive pulmonary disease) ([place])   . Diabetes mellitus, type 2 ([place])   . Diabetic Charcot foot ([place]) 12/30/2011  . Essential  hypertension   . History of GI bleed 12/05/2009   Qualifier: Diagnosis of  By: Zeb Comfort    . Hyperlipidemia   . Hypertensive cardiomyopathy ([place]) 11/08/2012   a. 05/2016: echo showing EF of 20-25% with normal cors by cath in 07/2016.  . Morbid obesity ([place]) 01/31/2008   Qualifier: Diagnosis of  By: Lenn Cal    . Noncompliance with medication regimen   . Obesity   . Panic attacks   . PAT (paroxysmal atrial tachycardia) ([place])    a. started on amiodarone 07/2017 for this.  . Sleep apnea    does not use cpap  . Stroke ([place])    06/24/17  . Tobacco abuse   . Urinary incontinence    Past Surgical History:  Procedure Laterality Date  . BREAST BIOPSY    . CARDIAC CATHETERIZATION N/A 07/28/2016   Procedure: Left Heart Cath and Coronary Angiography;  Surgeon: Jettie Booze, MD;  Location: [facility];  Service: Cardiovascular;  Laterality: N/A;  . COLONOSCOPY N/A 05/10/2019   Procedure: COLONOSCOPY;  Surgeon: Danie Binder, MD;  [facility];  Service: Endoscopy;  Laterality: N/A;  Phenergan 12.5 mg IV in pre-op  . DILATION AND CURETTAGE OF UTERUS    . I & D EXTREMITY Bilateral 09/22/2017   Procedure: BILATERAL DEBRIDEMENT LEG/FOOT ULCERS, APPLY VERAFLO WOUND VAC;  Surgeon: Newt Minion, MD;  Location: [facility];  Service: Orthopedics;  Laterality: Bilateral;  . I & D EXTREMITY Right 10/11/2018   Procedure: IRRIGATION AND DEBRIDEMENT RIGHT HAND;  Surgeon: [name], MD;  Location: [facility];  Service: Orthopedics;  Laterality: Right;  . I & D EXTREMITY Right 10/13/2018   Procedure: REPEAT IRRIGATION AND DEBRIDEMENT RIGHT HAND;  Surgeon: [name], MD;  Location: [facility];  Service: Orthopedics;  Laterality: Right;  . I & D EXTREMITY Right 11/22/2018   Procedure: IRRIGATION AND DEBRIDEMENT AND PINNING RIGHT HAND;  Surgeon: [name], MD;  Location: [facility];  Service: Orthopedics;  Laterality: Right;  . IR RADIOLOGIST EVAL & MGMT  07/05/2018  . POLYPECTOMY   05/10/2019   Procedure: POLYPECTOMY;  Surgeon: Danie Binder, MD;  [facility];  Service: Endoscopy;;  . SKIN SPLIT GRAFT Bilateral 09/28/2017   Procedure: BILATERAL SPLIT THICKNESS SKIN GRAFT LEGS/FEET AND APPLY VAC;  Surgeon: Newt Minion, MD;  Location: [facility];  Service: Orthopedics;  Laterality: Bilateral;  . SKIN SPLIT GRAFT Right 11/22/2018   Procedure: SKIN GRAFT SPLIT THICKNESS;  Surgeon: [name], MD;  Location: [facility];  Service: Orthopedics;  Laterality: Right;   Family History  Problem Relation Age of Onset  . Hypertension Mother   . Heart attack Mother   . Hypertension Father        CABG   . Hypertension Sister   . Hypertension Brother   . Hypertension  Sister   . Cancer Sister        breast   . Arthritis Other   . Cancer Other   . Diabetes Other   . Asthma Other   . Hypertension Daughter   . Hypertension Son    Social History   Socioeconomic History  . Marital status: Widowed    Spouse name: Not on file  . Number of children: 2  . Years of education: 9  . Highest education level: 9th grade  Occupational History  . Not on file  Tobacco Use  . Smoking status: Current Every Day Smoker    Packs/day: 1.00    Years: 20.00    Pack years: 20.00    Types: Cigarettes    Last attempt to quit: 06/03/2017    Years since quitting: 3.1  . Smokeless tobacco: Never Used  Vaping Use  . Vaping Use: Never used  Substance and Sexual Activity  . Alcohol use: No    Alcohol/week: 0.0 standard drinks    Comment: Quit in 2017  . Drug use: Not Currently    Frequency: 3.0 times per week    Types: Marijuana, Cocaine    Comment: none since August 2018  . Sexual activity: Not Currently    Birth control/protection: None  Other Topics Concern  . Not on file  Social History Narrative   Now has an apartment and lives with her dog. Is going to church and stopped all the "bad" stuff. Is "living for the [place]". Doing much better. Has a great outlook on life.Is much  happier now.    Social Determinants of Health   Financial Resource Strain: Low Risk   . Difficulty of Paying Living Expenses: Not hard at all  Food Insecurity: No Food Insecurity  . Worried About Charity fundraiser in the Last Year: Never true  . Ran Out of Food in the Last Year: Never true  Transportation Needs: Unmet Transportation Needs  . Lack of Transportation (Medical): Yes  . Lack of Transportation (Non-Medical): Yes  Physical Activity: Insufficiently Active  . Days of Exercise per Week: 7 days  . Minutes of Exercise per Session: 20 min  Stress: No Stress Concern Present  . Feeling of Stress : Not at all  Social Connections: Moderately Isolated  . Frequency of Communication with Friends and Family: More than three times a week  . Frequency of Social Gatherings with Friends and Family: Twice a week  . Attends Religious Services: More than 4 times per year  . Active Member of Clubs or Organizations: No  . Attends Archivist Meetings: Never  . Marital Status: Widowed    Tobacco Counseling Ready to quit: Yes Counseling given: Yes   Clinical Intake:  Pre-visit preparation completed: Yes  Pain : No/denies pain Pain Score: 0-No pain     BMI - recorded: 34.41 Nutritional Status: BMI > 30  Obese Nutritional Risks: None Diabetes: Yes CBG done?: No Did pt. bring in CBG monitor from home?: No  How often do you need to have someone help you when you read instructions, pamphlets, or other written materials from your doctor or pharmacy?: 4 - Often What is the last grade level you completed in school?: 9th  Diabetic?yes   Interpreter Needed?: No      Activities of Daily Living In your present state of health, do you have any difficulty performing the following activities: 08/05/2020 04/18/2020  Hearing? N N  Vision? N N  Difficulty concentrating or making  decisions? N N  Walking or climbing stairs? Y Y  Dressing or bathing? Y Y  Doing errands,  shopping? [name]  Preparing Food and eating ? Y Y  Using the Toilet? Y N  In the past six months, have you accidently leaked urine? N Y  Do you have problems with loss of bowel control? N N  Managing your Medications? Y Y  Comment - doespill pack  Managing your Finances? N N  Housekeeping or managing your Housekeeping? [name]  Some recent data might be hidden    Patient Care Team: [name], MD as PCP - General Herminio Commons, MD (Inactive) as PCP - Cardiology (Cardiology) Madelin Headings, DO (Optometry) Garwin Brothers, MD as Physician Assistant (Internal Medicine)  Indicate any recent Medical Services you may have received from other than [facility] providers in the past year (date may be approximate).     Assessment:   This is a routine wellness examination for [place].  Hearing/Vision screen No exam data present  Dietary issues and exercise activities discussed: Exercise limited by: orthopedic condition(s)  Goals    . <enter goal here>     "my main problems are my breathing and my diabetes"    . Exercise 3x per week (30 min per time)     Recommend exercising 3 times a week for at least 30 minutes at a time.     . Increase physical activity     Wants to be independent and do physical therapy in the future    . Increase physical activity     She wants to become more independent.    . Quit Smoking     Would like to quit smoking this year       Depression Screen PHQ 2/9 Scores 08/05/2020 08/05/2020 04/18/2020 09/13/2019 07/07/2019 06/01/2019 05/24/2019  PHQ - 2 Score 0 '2 6 3 1 1 3  ' PHQ- 9 Score 0 '2 11 6 ' - - 5    Fall Risk Fall Risk  08/05/2020 04/18/2020 09/13/2019 06/01/2019 05/24/2019  Falls in the past year? 0 0 '1 1 1  ' Number falls in past yr: 0 0 '1 1 1  ' Injury with Fall? 0 0 0 0 0  Risk for fall due to : No Fall Risks No Fall Risks - Impaired balance/gait;Medication side effect -  Follow up Falls evaluation completed Falls evaluation completed - Falls prevention  discussed -    Any stairs in or around the home? Yes  If so, are there any without handrails? No  Home free of loose throw rugs in walkways, pet beds, electrical cords, etc? Yes  Adequate lighting in your home to reduce risk of falls? Yes   ASSISTIVE DEVICES UTILIZED TO PREVENT FALLS:  Life alert? No  Use of a cane, walker or w/c? Yes  Grab bars in the bathroom? Yes  Shower chair or bench in shower? Yes  Elevated toilet seat or a handicapped toilet? No   TIMED UP AND GO:  Was the test performed? No .  Length of time to ambulate n/a    Cognitive Function:     6CIT Screen 08/05/2020 04/18/2020 02/21/2019 02/16/2018 12/15/2016  What Year? 0 points 0 points 0 points 0 points 0 points  What month? 0 points 0 points 0 points 0 points 0 points  What time? 0 points 0 points 0 points 0 points 0 points  Count back from 20 0 points 0 points 0 points 0 points 0 points  Months in  reverse 0 points 0 points 0 points 0 points 0 points  Repeat phrase 0 points 0 points 0 points 0 points 0 points  Total Score 0 0 0 0 0    Immunizations Immunization History  Administered Date(s) Administered  . H1N1 11/07/2008  . Influenza Split 07/12/2012  . Influenza Whole 07/03/2007, 07/13/2007, 07/24/2008, 09/26/2009, 07/17/2010  . Influenza,inj,Quad PF,6+ Mos 08/28/2013, 09/10/2014, 09/03/2015, 08/31/2016, 07/26/2018, 08/03/2019  . Pneumococcal Polysaccharide-23 08/25/2006, 11/12/2012  . Td 09/08/2005  . Tdap 03/15/2012    TDAP status: Up to date Flu Vaccine status: Declined, Education has been provided regarding the importance of this vaccine but patient still declined. Advised may receive this vaccine at local pharmacy or Health Dept. Aware to provide a copy of the vaccination record if obtained from local pharmacy or Health Dept. Verbalized acceptance and understanding. Pneumococcal vaccine status: Up to date Covid-19 vaccine status: Declined, Education has been provided regarding the importance of  this vaccine but patient still declined. Advised may receive this vaccine at local pharmacy or Health Dept.or vaccine clinic. Aware to provide a copy of the vaccination record if obtained from local pharmacy or Health Dept. Verbalized acceptance and understanding.  Qualifies for Shingles Vaccine? Yes   Zostavax completed No   Shingrix Completed?: No.    Education has been provided regarding the importance of this vaccine. Patient has been advised to call insurance company to determine out of pocket expense if they have not yet received this vaccine. Advised may also receive vaccine at local pharmacy or Health Dept. Verbalized acceptance and understanding.  Screening Tests Health Maintenance  Topic Date Due  . COVID-19 Vaccine (1) Never done  . OPHTHALMOLOGY EXAM  03/16/2018  . PAP SMEAR-Modifier  05/08/2019  . FOOT EXAM  09/06/2019  . HEMOGLOBIN A1C  11/09/2019  . MAMMOGRAM  02/18/2020  . INFLUENZA VACCINE  05/26/2020  . TETANUS/TDAP  03/15/2022  . COLONOSCOPY  05/09/2029  . PNEUMOCOCCAL POLYSACCHARIDE VACCINE AGE 3-64 HIGH RISK  Completed  . Hepatitis C Screening  Completed  . HIV Screening  Completed    Health Maintenance  Health Maintenance Due  Topic Date Due  . COVID-19 Vaccine (1) Never done  . OPHTHALMOLOGY EXAM  03/16/2018  . PAP SMEAR-Modifier  05/08/2019  . FOOT EXAM  09/06/2019  . HEMOGLOBIN A1C  11/09/2019  . MAMMOGRAM  02/18/2020  . INFLUENZA VACCINE  05/26/2020    Colorectal cancer screening: Completed 05/10/19. Repeat every 10 years Mammogram status: Ordered 08/05/20. Pt provided with contact info and advised to call to schedule appt.  Bone Density n/a  Lung Cancer Screening: (Low Dose CT Chest recommended if Age 52-80 years, 30 pack-year currently smoking OR have quit w/in 15years.) does qualify.   Lung Cancer Screening Referral: yes  Additional Screening:  Hepatitis C Screening: does not qualify; Completed n/a  Vision Screening: Recommended annual  ophthalmology exams for early detection of glaucoma and other disorders of the eye. Is the patient up to date with their annual eye exam?  no Who is the provider or what is the name of the office in which the patient attends annual eye exams? N/a If pt is not established with a provider, would they like to be referred to a provider to establish care? No   Dental Screening: Recommended annual dental exams for proper oral hygiene  Community Resource Referral / Chronic Care Management: CRR required this visit?  No   CCM required this visit?  No      Plan:  I have personally reviewed and noted the following in the patient's chart:   . Medical and social history . Use of alcohol, tobacco or illicit drugs  . Current medications and supplements . Functional ability and status . Nutritional status . Physical activity . Advanced directives . List of other physicians . Hospitalizations, surgeries, and ER visits in previous 12 months . Vitals . Screenings to include cognitive, depression, and falls . Referrals and appointments  In addition, I have reviewed and discussed with patient certain preventive protocols, quality metrics, and best practice recommendations. A written personalized care plan for preventive services as well as general preventive health recommendations were provided to patient.     Laretta Bolster, [place]   02/72/5366   Nurse Notes:

## 2020-08-05 NOTE — Telephone Encounter (Signed)
Prescription refilled.

## 2020-08-05 NOTE — Telephone Encounter (Signed)
Please advise on referral

## 2020-08-05 NOTE — Telephone Encounter (Signed)
error 

## 2020-08-05 NOTE — Telephone Encounter (Signed)
Patient wants a referral for her foot/toenails clipped and wants her albuterol sent to [facility] in [place] p# [phone]

## 2020-08-06 NOTE — Telephone Encounter (Signed)
Called patient. No answer. Vm not set up so unable to leave message.

## 2020-08-06 NOTE — Telephone Encounter (Signed)
Pt has 6 care gaps and needs an in office visit, had one that she recently missed. Pls explain the importance of coming in, referral will be done at that visit, pls reschedule an appt for physical with pap for her and she nEEDS to come in

## 2020-08-12 ENCOUNTER — Other Ambulatory Visit: Payer: Self-pay | Admitting: Family Medicine

## 2020-08-12 DIAGNOSIS — I509 Heart failure, unspecified: Secondary | ICD-10-CM | POA: Diagnosis not present

## 2020-08-12 DIAGNOSIS — R062 Wheezing: Secondary | ICD-10-CM | POA: Diagnosis not present

## 2020-08-13 ENCOUNTER — Emergency Department (HOSPITAL_COMMUNITY): Payer: Medicare Other

## 2020-08-13 ENCOUNTER — Inpatient Hospital Stay (HOSPITAL_COMMUNITY)
Admission: EM | Admit: 2020-08-13 | Discharge: 2020-08-28 | DRG: 064 | Disposition: A | Discharge status: Skilled Nursing Facility | Payer: Medicare Other | Attending: Internal Medicine | Admitting: Internal Medicine

## 2020-08-13 ENCOUNTER — Emergency Department (HOSPITAL_COMMUNITY)
Admission: EM | Admit: 2020-08-13 | Discharge: 2020-08-13 | Disposition: A | Discharge status: Home / Self Care | Payer: Self-pay | Attending: Emergency Medicine | Admitting: Emergency Medicine

## 2020-08-13 ENCOUNTER — Other Ambulatory Visit: Payer: Self-pay | Admitting: Family Medicine

## 2020-08-13 ENCOUNTER — Other Ambulatory Visit: Payer: Self-pay

## 2020-08-13 ENCOUNTER — Encounter (HOSPITAL_COMMUNITY): Payer: Self-pay

## 2020-08-13 DIAGNOSIS — I517 Cardiomegaly: Secondary | ICD-10-CM | POA: Diagnosis not present

## 2020-08-13 DIAGNOSIS — I480 Paroxysmal atrial fibrillation: Secondary | ICD-10-CM | POA: Diagnosis present

## 2020-08-13 DIAGNOSIS — Z978 Presence of other specified devices: Secondary | ICD-10-CM

## 2020-08-13 DIAGNOSIS — I4891 Unspecified atrial fibrillation: Secondary | ICD-10-CM | POA: Diagnosis not present

## 2020-08-13 DIAGNOSIS — F32A Depression, unspecified: Secondary | ICD-10-CM | POA: Diagnosis not present

## 2020-08-13 DIAGNOSIS — I6381 Other cerebral infarction due to occlusion or stenosis of small artery: Secondary | ICD-10-CM | POA: Diagnosis not present

## 2020-08-13 DIAGNOSIS — J9601 Acute respiratory failure with hypoxia: Secondary | ICD-10-CM | POA: Diagnosis present

## 2020-08-13 DIAGNOSIS — J9690 Respiratory failure, unspecified, unspecified whether with hypoxia or hypercapnia: Secondary | ICD-10-CM | POA: Diagnosis present

## 2020-08-13 DIAGNOSIS — R569 Unspecified convulsions: Secondary | ICD-10-CM | POA: Diagnosis not present

## 2020-08-13 DIAGNOSIS — Z1231 Encounter for screening mammogram for malignant neoplasm of breast: Secondary | ICD-10-CM

## 2020-08-13 DIAGNOSIS — I340 Nonrheumatic mitral (valve) insufficiency: Secondary | ICD-10-CM | POA: Diagnosis not present

## 2020-08-13 DIAGNOSIS — L89150 Pressure ulcer of sacral region, unstageable: Secondary | ICD-10-CM | POA: Diagnosis present

## 2020-08-13 DIAGNOSIS — J449 Chronic obstructive pulmonary disease, unspecified: Secondary | ICD-10-CM | POA: Diagnosis not present

## 2020-08-13 DIAGNOSIS — E11649 Type 2 diabetes mellitus with hypoglycemia without coma: Secondary | ICD-10-CM | POA: Diagnosis not present

## 2020-08-13 DIAGNOSIS — I1310 Hypertensive heart and chronic kidney disease without heart failure, with stage 1 through stage 4 chronic kidney disease, or unspecified chronic kidney disease: Secondary | ICD-10-CM | POA: Diagnosis not present

## 2020-08-13 DIAGNOSIS — E785 Hyperlipidemia, unspecified: Secondary | ICD-10-CM | POA: Diagnosis not present

## 2020-08-13 DIAGNOSIS — Z4659 Encounter for fitting and adjustment of other gastrointestinal appliance and device: Secondary | ICD-10-CM

## 2020-08-13 DIAGNOSIS — G9341 Metabolic encephalopathy: Secondary | ICD-10-CM | POA: Diagnosis not present

## 2020-08-13 DIAGNOSIS — N1831 Chronic kidney disease, stage 3a: Secondary | ICD-10-CM | POA: Diagnosis not present

## 2020-08-13 DIAGNOSIS — G0490 Encephalitis and encephalomyelitis, unspecified: Secondary | ICD-10-CM | POA: Diagnosis not present

## 2020-08-13 DIAGNOSIS — G40401 Other generalized epilepsy and epileptic syndromes, not intractable, with status epilepticus: Secondary | ICD-10-CM | POA: Diagnosis present

## 2020-08-13 DIAGNOSIS — G9340 Encephalopathy, unspecified: Secondary | ICD-10-CM

## 2020-08-13 DIAGNOSIS — R410 Disorientation, unspecified: Secondary | ICD-10-CM | POA: Diagnosis not present

## 2020-08-13 DIAGNOSIS — Z8673 Personal history of transient ischemic attack (TIA), and cerebral infarction without residual deficits: Secondary | ICD-10-CM

## 2020-08-13 DIAGNOSIS — I639 Cerebral infarction, unspecified: Secondary | ICD-10-CM | POA: Diagnosis not present

## 2020-08-13 DIAGNOSIS — Z7901 Long term (current) use of anticoagulants: Secondary | ICD-10-CM

## 2020-08-13 DIAGNOSIS — L89320 Pressure ulcer of left buttock, unstageable: Secondary | ICD-10-CM | POA: Diagnosis not present

## 2020-08-13 DIAGNOSIS — Z833 Family history of diabetes mellitus: Secondary | ICD-10-CM

## 2020-08-13 DIAGNOSIS — M6281 Muscle weakness (generalized): Secondary | ICD-10-CM | POA: Diagnosis not present

## 2020-08-13 DIAGNOSIS — K219 Gastro-esophageal reflux disease without esophagitis: Secondary | ICD-10-CM | POA: Diagnosis present

## 2020-08-13 DIAGNOSIS — E1122 Type 2 diabetes mellitus with diabetic chronic kidney disease: Secondary | ICD-10-CM | POA: Diagnosis present

## 2020-08-13 DIAGNOSIS — I499 Cardiac arrhythmia, unspecified: Secondary | ICD-10-CM | POA: Diagnosis not present

## 2020-08-13 DIAGNOSIS — N1832 Chronic kidney disease, stage 3b: Secondary | ICD-10-CM | POA: Diagnosis not present

## 2020-08-13 DIAGNOSIS — Z23 Encounter for immunization: Secondary | ICD-10-CM | POA: Diagnosis not present

## 2020-08-13 DIAGNOSIS — Z01818 Encounter for other preprocedural examination: Secondary | ICD-10-CM

## 2020-08-13 DIAGNOSIS — F410 Panic disorder [episodic paroxysmal anxiety] without agoraphobia: Secondary | ICD-10-CM | POA: Diagnosis present

## 2020-08-13 DIAGNOSIS — R4781 Slurred speech: Secondary | ICD-10-CM | POA: Diagnosis not present

## 2020-08-13 DIAGNOSIS — F1410 Cocaine abuse, uncomplicated: Secondary | ICD-10-CM | POA: Diagnosis present

## 2020-08-13 DIAGNOSIS — F419 Anxiety disorder, unspecified: Secondary | ICD-10-CM | POA: Diagnosis present

## 2020-08-13 DIAGNOSIS — E11610 Type 2 diabetes mellitus with diabetic neuropathic arthropathy: Secondary | ICD-10-CM | POA: Diagnosis present

## 2020-08-13 DIAGNOSIS — D631 Anemia in chronic kidney disease: Secondary | ICD-10-CM | POA: Diagnosis present

## 2020-08-13 DIAGNOSIS — R509 Fever, unspecified: Secondary | ICD-10-CM | POA: Diagnosis not present

## 2020-08-13 DIAGNOSIS — Z6841 Body Mass Index (BMI) 40.0 and over, adult: Secondary | ICD-10-CM | POA: Diagnosis not present

## 2020-08-13 DIAGNOSIS — I428 Other cardiomyopathies: Secondary | ICD-10-CM | POA: Diagnosis present

## 2020-08-13 DIAGNOSIS — R262 Difficulty in walking, not elsewhere classified: Secondary | ICD-10-CM | POA: Diagnosis not present

## 2020-08-13 DIAGNOSIS — R6889 Other general symptoms and signs: Secondary | ICD-10-CM | POA: Diagnosis not present

## 2020-08-13 DIAGNOSIS — R000 Tachycardia, unspecified: Secondary | ICD-10-CM | POA: Diagnosis not present

## 2020-08-13 DIAGNOSIS — I130 Hypertensive heart and chronic kidney disease with heart failure and stage 1 through stage 4 chronic kidney disease, or unspecified chronic kidney disease: Secondary | ICD-10-CM | POA: Diagnosis not present

## 2020-08-13 DIAGNOSIS — J9600 Acute respiratory failure, unspecified whether with hypoxia or hypercapnia: Secondary | ICD-10-CM | POA: Diagnosis present

## 2020-08-13 DIAGNOSIS — E118 Type 2 diabetes mellitus with unspecified complications: Secondary | ICD-10-CM | POA: Diagnosis not present

## 2020-08-13 DIAGNOSIS — E1110 Type 2 diabetes mellitus with ketoacidosis without coma: Secondary | ICD-10-CM | POA: Diagnosis not present

## 2020-08-13 DIAGNOSIS — R404 Transient alteration of awareness: Secondary | ICD-10-CM | POA: Diagnosis not present

## 2020-08-13 DIAGNOSIS — I10 Essential (primary) hypertension: Secondary | ICD-10-CM | POA: Diagnosis not present

## 2020-08-13 DIAGNOSIS — Z7984 Long term (current) use of oral hypoglycemic drugs: Secondary | ICD-10-CM

## 2020-08-13 DIAGNOSIS — I5042 Chronic combined systolic (congestive) and diastolic (congestive) heart failure: Secondary | ICD-10-CM | POA: Diagnosis not present

## 2020-08-13 DIAGNOSIS — R1310 Dysphagia, unspecified: Secondary | ICD-10-CM | POA: Diagnosis not present

## 2020-08-13 DIAGNOSIS — Z4682 Encounter for fitting and adjustment of non-vascular catheter: Secondary | ICD-10-CM | POA: Diagnosis not present

## 2020-08-13 DIAGNOSIS — E861 Hypovolemia: Secondary | ICD-10-CM | POA: Diagnosis present

## 2020-08-13 DIAGNOSIS — I9589 Other hypotension: Secondary | ICD-10-CM | POA: Diagnosis present

## 2020-08-13 DIAGNOSIS — L8990 Pressure ulcer of unspecified site, unspecified stage: Secondary | ICD-10-CM | POA: Diagnosis present

## 2020-08-13 DIAGNOSIS — I69354 Hemiplegia and hemiparesis following cerebral infarction affecting left non-dominant side: Secondary | ICD-10-CM | POA: Diagnosis not present

## 2020-08-13 DIAGNOSIS — G894 Chronic pain syndrome: Secondary | ICD-10-CM | POA: Diagnosis not present

## 2020-08-13 DIAGNOSIS — N179 Acute kidney failure, unspecified: Secondary | ICD-10-CM | POA: Diagnosis present

## 2020-08-13 DIAGNOSIS — R2681 Unsteadiness on feet: Secondary | ICD-10-CM | POA: Diagnosis not present

## 2020-08-13 DIAGNOSIS — G928 Other toxic encephalopathy: Secondary | ICD-10-CM | POA: Diagnosis present

## 2020-08-13 DIAGNOSIS — Z7401 Bed confinement status: Secondary | ICD-10-CM | POA: Diagnosis not present

## 2020-08-13 DIAGNOSIS — R739 Hyperglycemia, unspecified: Secondary | ICD-10-CM | POA: Diagnosis not present

## 2020-08-13 DIAGNOSIS — I471 Supraventricular tachycardia: Secondary | ICD-10-CM | POA: Diagnosis present

## 2020-08-13 DIAGNOSIS — Z9114 Patient's other noncompliance with medication regimen: Secondary | ICD-10-CM

## 2020-08-13 DIAGNOSIS — E872 Acidosis, unspecified: Secondary | ICD-10-CM

## 2020-08-13 DIAGNOSIS — Z8249 Family history of ischemic heart disease and other diseases of the circulatory system: Secondary | ICD-10-CM

## 2020-08-13 DIAGNOSIS — G40409 Other generalized epilepsy and epileptic syndromes, not intractable, without status epilepticus: Secondary | ICD-10-CM | POA: Diagnosis not present

## 2020-08-13 DIAGNOSIS — E878 Other disorders of electrolyte and fluid balance, not elsewhere classified: Secondary | ICD-10-CM | POA: Diagnosis present

## 2020-08-13 DIAGNOSIS — G629 Polyneuropathy, unspecified: Secondary | ICD-10-CM | POA: Diagnosis not present

## 2020-08-13 DIAGNOSIS — E876 Hypokalemia: Secondary | ICD-10-CM | POA: Diagnosis present

## 2020-08-13 DIAGNOSIS — F17210 Nicotine dependence, cigarettes, uncomplicated: Secondary | ICD-10-CM | POA: Diagnosis present

## 2020-08-13 DIAGNOSIS — Z743 Need for continuous supervision: Secondary | ICD-10-CM | POA: Diagnosis not present

## 2020-08-13 DIAGNOSIS — M2550 Pain in unspecified joint: Secondary | ICD-10-CM | POA: Diagnosis not present

## 2020-08-13 DIAGNOSIS — E1111 Type 2 diabetes mellitus with ketoacidosis with coma: Secondary | ICD-10-CM | POA: Diagnosis not present

## 2020-08-13 DIAGNOSIS — Z7902 Long term (current) use of antithrombotics/antiplatelets: Secondary | ICD-10-CM

## 2020-08-13 DIAGNOSIS — Z20822 Contact with and (suspected) exposure to covid-19: Secondary | ICD-10-CM | POA: Diagnosis present

## 2020-08-13 DIAGNOSIS — G40901 Epilepsy, unspecified, not intractable, with status epilepticus: Secondary | ICD-10-CM | POA: Diagnosis not present

## 2020-08-13 DIAGNOSIS — Z79899 Other long term (current) drug therapy: Secondary | ICD-10-CM

## 2020-08-13 DIAGNOSIS — I69054 Hemiplegia and hemiparesis following nontraumatic subarachnoid hemorrhage affecting left non-dominant side: Secondary | ICD-10-CM | POA: Diagnosis not present

## 2020-08-13 DIAGNOSIS — J9811 Atelectasis: Secondary | ICD-10-CM | POA: Diagnosis not present

## 2020-08-13 LAB — CBC WITH DIFFERENTIAL/PLATELET
Abs Immature Granulocytes: 0.04 10*3/uL (ref 0.00–0.07)
Basophils Absolute: 0.1 10*3/uL (ref 0.0–0.1)
Basophils Relative: 1 %
Eosinophils Absolute: 0.1 10*3/uL (ref 0.0–0.5)
Eosinophils Relative: 1 %
HCT: 47.1 % — ABNORMAL HIGH (ref 36.0–46.0)
Hemoglobin: 15 g/dL (ref 12.0–15.0)
Immature Granulocytes: 0 %
Lymphocytes Relative: 21 %
Lymphs Abs: 2.3 10*3/uL (ref 0.7–4.0)
MCH: 29.5 pg (ref 26.0–34.0)
MCHC: 31.8 g/dL (ref 30.0–36.0)
MCV: 92.5 fL (ref 80.0–100.0)
Monocytes Absolute: 0.4 10*3/uL (ref 0.1–1.0)
Monocytes Relative: 4 %
Neutro Abs: 8 10*3/uL — ABNORMAL HIGH (ref 1.7–7.7)
Neutrophils Relative %: 73 %
Platelets: 241 10*3/uL (ref 150–400)
RBC: 5.09 MIL/uL (ref 3.87–5.11)
RDW: 13.2 % (ref 11.5–15.5)
WBC: 10.9 10*3/uL — ABNORMAL HIGH (ref 4.0–10.5)
nRBC: 0 % (ref 0.0–0.2)

## 2020-08-13 LAB — BLOOD GAS, ARTERIAL
Acid-base deficit: 0.3 mmol/L (ref 0.0–2.0)
Bicarbonate: 23.8 mmol/L (ref 20.0–28.0)
Drawn by: 35043
FIO2: 50
MECHVT: 380 mL
O2 Saturation: 99.9 %
PEEP: 5 cmH2O
Patient temperature: 37
RATE: 16 resp/min
pCO2 arterial: 46 mmHg (ref 32.0–48.0)
pH, Arterial: 7.348 — ABNORMAL LOW (ref 7.350–7.450)
pO2, Arterial: 205 mmHg — ABNORMAL HIGH (ref 83.0–108.0)

## 2020-08-13 LAB — COMPREHENSIVE METABOLIC PANEL
ALT: 16 U/L (ref 0–44)
AST: 19 U/L (ref 15–41)
Albumin: 3.1 g/dL — ABNORMAL LOW (ref 3.5–5.0)
Alkaline Phosphatase: 112 U/L (ref 38–126)
Anion gap: >20 — ABNORMAL HIGH (ref 5–15)
BUN: 14 mg/dL (ref 6–20)
CO2: 20 mmol/L — ABNORMAL LOW (ref 22–32)
Calcium: 8.9 mg/dL (ref 8.9–10.3)
Chloride: 94 mmol/L — ABNORMAL LOW (ref 98–111)
Creatinine, Ser: 1.88 mg/dL — ABNORMAL HIGH (ref 0.44–1.00)
GFR, Estimated: 29 mL/min — ABNORMAL LOW (ref >60)
Glucose, Bld: 772 mg/dL (ref 70–99)
Potassium: 2.6 mmol/L — CL (ref 3.5–5.1)
Sodium: 135 mmol/L (ref 135–145)
Total Bilirubin: 0.9 mg/dL (ref 0.3–1.2)
Total Protein: 6.9 g/dL (ref 6.5–8.1)

## 2020-08-13 LAB — RAPID URINE DRUG SCREEN, HOSP PERFORMED
Amphetamines: NOT DETECTED
Barbiturates: NOT DETECTED
Benzodiazepines: NOT DETECTED
Cocaine: POSITIVE — AB
Opiates: NOT DETECTED
Tetrahydrocannabinol: NOT DETECTED

## 2020-08-13 LAB — URINALYSIS, ROUTINE W REFLEX MICROSCOPIC
Bilirubin Urine: NEGATIVE
Glucose, UA: >=500 mg/dL — AB
Ketones, ur: NEGATIVE mg/dL
Leukocytes,Ua: NEGATIVE
Nitrite: NEGATIVE
Protein, ur: >300 mg/dL — AB
Specific Gravity, Urine: 1.015 (ref 1.005–1.030)
pH: 7 (ref 5.0–8.0)

## 2020-08-13 LAB — LACTIC ACID, PLASMA: Lactic Acid, Venous: 10.1 mmol/L (ref 0.5–1.9)

## 2020-08-13 LAB — URINALYSIS, MICROSCOPIC (REFLEX)

## 2020-08-13 LAB — BETA-HYDROXYBUTYRIC ACID: Beta-Hydroxybutyric Acid: 0.2 mmol/L (ref 0.05–0.27)

## 2020-08-13 LAB — APTT: aPTT: <20 seconds — ABNORMAL LOW (ref 24–36)

## 2020-08-13 LAB — TROPONIN I (HIGH SENSITIVITY)
Troponin I (High Sensitivity): 41 ng/L — ABNORMAL HIGH (ref <18)
Troponin I (High Sensitivity): 39 ng/L — ABNORMAL HIGH (ref <18)

## 2020-08-13 LAB — RESPIRATORY PANEL BY RT PCR (FLU A&B, COVID)
Influenza A by PCR: NEGATIVE
Influenza B by PCR: NEGATIVE
SARS Coronavirus 2 by RT PCR: NEGATIVE

## 2020-08-13 LAB — CBG MONITORING, ED
Glucose-Capillary: >600 mg/dL (ref 70–99)
Glucose-Capillary: >600 mg/dL (ref 70–99)

## 2020-08-13 LAB — PROTIME-INR
INR: 1 (ref 0.8–1.2)
Prothrombin Time: 12.3 seconds (ref 11.4–15.2)

## 2020-08-13 LAB — ETHANOL: Alcohol, Ethyl (B): <10 mg/dL (ref <10)

## 2020-08-13 MED ORDER — POTASSIUM CHLORIDE 10 MEQ/100ML IV SOLN
10.0000 meq | INTRAVENOUS | Status: AC
  Administered 2020-08-13 – 2020-08-14 (×4): 10 meq via INTRAVENOUS
  Filled 2020-08-13 (×4): qty 100

## 2020-08-13 MED ORDER — SODIUM CHLORIDE 0.9 % IV BOLUS
1000.0000 mL | Freq: Once | INTRAVENOUS | Status: AC
  Administered 2020-08-13: 1000 mL via INTRAVENOUS

## 2020-08-13 MED ORDER — METRONIDAZOLE IN NACL 5-0.79 MG/ML-% IV SOLN
500.0000 mg | Freq: Once | INTRAVENOUS | Status: AC
  Administered 2020-08-14: 500 mg via INTRAVENOUS
  Filled 2020-08-13: qty 100

## 2020-08-13 MED ORDER — LACTATED RINGERS IV SOLN: INTRAVENOUS | Status: DC

## 2020-08-13 MED ORDER — LEVETIRACETAM IN NACL 1000 MG/100ML IV SOLN
1000.0000 mg | Freq: Two times a day (BID) | INTRAVENOUS | Status: DC
  Administered 2020-08-14 – 2020-08-15 (×3): 1000 mg via INTRAVENOUS
  Filled 2020-08-13 (×3): qty 100

## 2020-08-13 MED ORDER — VANCOMYCIN HCL 1500 MG/300ML IV SOLN
1500.0000 mg | Freq: Once | INTRAVENOUS | Status: AC
  Administered 2020-08-14: 1500 mg via INTRAVENOUS
  Filled 2020-08-13: qty 300

## 2020-08-13 MED ORDER — LORAZEPAM 2 MG/ML IJ SOLN
2.0000 mg | Freq: Once | INTRAMUSCULAR | Status: AC
  Administered 2020-08-13: 2 mg via INTRAVENOUS
  Filled 2020-08-13: qty 1

## 2020-08-13 MED ORDER — INSULIN REGULAR(HUMAN) IN NACL 100-0.9 UT/100ML-% IV SOLN
INTRAVENOUS | Status: DC
  Administered 2020-08-13: 4.6 [IU]/h via INTRAVENOUS
  Administered 2020-08-14: 0.8 [IU]/h via INTRAVENOUS
  Filled 2020-08-13: qty 100

## 2020-08-13 MED ORDER — ACETAMINOPHEN 650 MG RE SUPP
650.0000 mg | Freq: Once | RECTAL | Status: AC
  Administered 2020-08-13: 650 mg via RECTAL
  Filled 2020-08-13: qty 1

## 2020-08-13 MED ORDER — LEVETIRACETAM IN NACL 1500 MG/100ML IV SOLN
1500.0000 mg | Freq: Once | INTRAVENOUS | Status: AC
  Administered 2020-08-13: 1500 mg via INTRAVENOUS
  Filled 2020-08-13: qty 100

## 2020-08-13 MED ORDER — NALOXONE HCL 2 MG/2ML IJ SOSY
1.0000 mg | PREFILLED_SYRINGE | Freq: Once | INTRAMUSCULAR | Status: AC
  Administered 2020-08-13: 1 mg via INTRAVENOUS

## 2020-08-13 MED ORDER — VANCOMYCIN HCL IN DEXTROSE 1-5 GM/200ML-% IV SOLN: 1000.0000 mg | Freq: Once | INTRAVENOUS | Status: DC

## 2020-08-13 MED ORDER — DEXTROSE 50 % IV SOLN
0.0000 mL | INTRAVENOUS | Status: DC | PRN
  Administered 2020-08-18: 50 mL via INTRAVENOUS
  Filled 2020-08-13: qty 50

## 2020-08-13 MED ORDER — SUCCINYLCHOLINE CHLORIDE 20 MG/ML IJ SOLN
100.0000 mg | Freq: Once | INTRAMUSCULAR | Status: AC
  Administered 2020-08-13: 100 mg via INTRAVENOUS

## 2020-08-13 MED ORDER — LACTATED RINGERS IV BOLUS
20.0000 mL/kg | Freq: Once | INTRAVENOUS | Status: AC
  Administered 2020-08-13: 1652 mL via INTRAVENOUS

## 2020-08-13 MED ORDER — DEXTROSE IN LACTATED RINGERS 5 % IV SOLN: INTRAVENOUS | Status: DC

## 2020-08-13 MED ORDER — SODIUM CHLORIDE 0.9 % IV SOLN
2.0000 g | Freq: Once | INTRAVENOUS | Status: AC
  Administered 2020-08-13: 2 g via INTRAVENOUS
  Filled 2020-08-13: qty 2

## 2020-08-13 MED ORDER — PROPOFOL 1000 MG/100ML IV EMUL
5.0000 ug/kg/min | INTRAVENOUS | Status: DC
  Administered 2020-08-13: 10 ug/kg/min via INTRAVENOUS
  Administered 2020-08-14: 35 ug/kg/min via INTRAVENOUS
  Administered 2020-08-14: 30 ug/kg/min via INTRAVENOUS
  Administered 2020-08-14: 25 ug/kg/min via INTRAVENOUS
  Administered 2020-08-14: 55 ug/kg/min via INTRAVENOUS
  Administered 2020-08-14 (×2): 30 ug/kg/min via INTRAVENOUS
  Administered 2020-08-15 (×2): 40 ug/kg/min via INTRAVENOUS
  Administered 2020-08-15: 30 ug/kg/min via INTRAVENOUS
  Filled 2020-08-13 (×6): qty 100
  Filled 2020-08-13: qty 200
  Filled 2020-08-13 (×2): qty 100

## 2020-08-13 MED ORDER — SODIUM CHLORIDE 0.9 % IV BOLUS
500.0000 mL | Freq: Once | INTRAVENOUS | Status: AC
  Administered 2020-08-13: 500 mL via INTRAVENOUS

## 2020-08-13 MED ORDER — LORAZEPAM 2 MG/ML IJ SOLN
INTRAMUSCULAR | Status: AC
  Filled 2020-08-13: qty 1

## 2020-08-13 MED ORDER — LORAZEPAM 2 MG/ML IJ SOLN
1.0000 mg | Freq: Once | INTRAMUSCULAR | Status: AC
  Administered 2020-08-13: 1 mg via INTRAVENOUS

## 2020-08-13 MED ORDER — PROPOFOL 10 MG/ML IV BOLUS
1.0000 mg/kg | Freq: Once | INTRAVENOUS | Status: AC
  Administered 2020-08-13: 82.6 mg via INTRAVENOUS
  Filled 2020-08-13: qty 20

## 2020-08-13 NOTE — ED Notes (Signed)
CRITICAL VALUE ALERT  Critical Value:  K+ 2.6 & Glucose 772  Date & Time Notied:  08/13/2020 2227  Provider Notified: Dr. Laverta Baltimore  Orders Received/Actions taken: see chart

## 2020-08-13 NOTE — Consult Note (Signed)
[facility] TeleNeurology Consult Services  Stat Consult  Date of Service:   08/13/2020 23:25:14  Impression:     .  G40.901 - Nonintractable epilepsy with status epilepticus, unspecified epilepsy type ([place])     .  R73.9 - Hyperglycaemia, unspecified  Comments/Sign-Out: 54 YO F with h/o HTN, DM, Afib, HLD and seizures presented with seizure activity. likely breakthrough seizure in patient with h/o seizures and not on any AEDs. Hyperglycemia of 772 can also cause seizure.  CT HEAD: Showed No Acute Hemorrhage or Acute Core Infarct  Our recommendations are outlined below.  Diagnostic Studies: MRI brain w/wo contrast Routine EEG  Laboratory Studies: Comprehensive Metabolic profile  Nursing Recommendations: Maintain Euglycemia and [place] Neuro checks q1-2 hrs during ICU stay if critical  Seizure precautions: Seizure precautions including no driving for state mandated time frame were discussed with patient with clear understanding  DVT Prophylaxis: Choice of Primary Team  Disposition: Neurology will follow  Free Text: Keppra 1gm BID  Additional Recommendations::     Metrics: [facility] Notification Time: 08/13/2020 23:23:09 Stamp Time: 08/13/2020 23:25:14 Callback Response Time: 08/13/2020 23:25:44   ----------------------------------------------------------------------------------------------------  Chief Complaint: found unresponsive, seizure activity  History of Present Illness: Patient is a 54 year old Female.  54 YO F with h/o HTN, DM, Afib, HLD and seizures presented with seizure activity. She has prior h/o drinking but has slowed down per son and does not drink much any more. She has had seizures before but son is not sure if she takes any medications. He states that she is not compliant with her meds. Today she was found unresponsive and had seizure en route and on arrival had another seizure and was intubated. her BGL was 772  on arrival.    Past Medical History:     . Hypertension     . Diabetes Mellitus     . Hyperlipidemia     . Atrial Fibrillation  Anticoagulant use:  Eliquis  Antiplatelet use: plavix     Examination:  Neuro Exam:     Patient intubated sedated and paralyzed. so unable to do a neuro exam.   Patient/Family was informed the Neurology Consult would occur via TeleHealth consult by way of interactive audio and video telecommunications and consented to receiving care in this manner.  Patient is being evaluated for possible acute neurologic impairment and high probability of imminent or life-threatening deterioration. I spent total of 25 minutes providing care to this patient, including time for face to face visit via telemedicine, review of medical records, imaging studies and discussion of findings with providers, the patient and/or family.   Dr Beau Fanny   [facility] [phone]  Case [id number]

## 2020-08-13 NOTE — ED Triage Notes (Signed)
Pt arrives via [facility], called out for seizures. On arrival, pt lying on floor in home, incontinent of urine, unresponsive. Had episodes of apnea and seizure activity. IV established. Hypertensive en route.

## 2020-08-13 NOTE — Telephone Encounter (Signed)
Patient has been scheduled for 11/4 at 1:20pm

## 2020-08-13 NOTE — Progress Notes (Signed)
Referral received for Lung Cancer Screening Program from Dr. Moshe Cipro. Patient states that she is not able to do this at this time. Referring provider aware.

## 2020-08-13 NOTE — Progress Notes (Signed)
Transported patient to and from CT without incident.  Suctioned before and after trip.

## 2020-08-13 NOTE — Progress Notes (Signed)
Moved tube from 26 at lip to 25 at lip after CT view per MD order.

## 2020-08-13 NOTE — ED Notes (Signed)
Pt noted to have generalized seizure activity lasting several minutes. EDP notified. Will prepare for intubation per EDP

## 2020-08-13 NOTE — ED Notes (Signed)
Preparing for intubation
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
## 2020-08-14 ENCOUNTER — Inpatient Hospital Stay (HOSPITAL_COMMUNITY): Payer: Medicare Other

## 2020-08-14 ENCOUNTER — Inpatient Hospital Stay (HOSPITAL_COMMUNITY)
Admit: 2020-08-14 | Discharge: 2020-08-14 | Disposition: A | Discharge status: Home / Self Care | Payer: Medicare Other | Attending: Internal Medicine | Admitting: Internal Medicine

## 2020-08-14 DIAGNOSIS — G40901 Epilepsy, unspecified, not intractable, with status epilepticus: Secondary | ICD-10-CM

## 2020-08-14 DIAGNOSIS — I4891 Unspecified atrial fibrillation: Secondary | ICD-10-CM

## 2020-08-14 DIAGNOSIS — I340 Nonrheumatic mitral (valve) insufficiency: Secondary | ICD-10-CM | POA: Diagnosis not present

## 2020-08-14 DIAGNOSIS — J9600 Acute respiratory failure, unspecified whether with hypoxia or hypercapnia: Secondary | ICD-10-CM

## 2020-08-14 DIAGNOSIS — N179 Acute kidney failure, unspecified: Secondary | ICD-10-CM | POA: Diagnosis not present

## 2020-08-14 DIAGNOSIS — R509 Fever, unspecified: Secondary | ICD-10-CM | POA: Diagnosis present

## 2020-08-14 DIAGNOSIS — E872 Acidosis: Secondary | ICD-10-CM

## 2020-08-14 DIAGNOSIS — G40309 Generalized idiopathic epilepsy and epileptic syndromes, not intractable, without status epilepticus: Secondary | ICD-10-CM | POA: Insufficient documentation

## 2020-08-14 DIAGNOSIS — G40409 Other generalized epilepsy and epileptic syndromes, not intractable, without status epilepticus: Secondary | ICD-10-CM | POA: Diagnosis present

## 2020-08-14 LAB — ECHOCARDIOGRAM COMPLETE
Area-P 1/2: 2.75 cm2
S' Lateral: 3.05 cm

## 2020-08-14 LAB — LACTIC ACID, PLASMA
Lactic Acid, Venous: 5.2 mmol/L (ref 0.5–1.9)
Lactic Acid, Venous: 3 mmol/L (ref 0.5–1.9)
Lactic Acid, Venous: 2.5 mmol/L (ref 0.5–1.9)

## 2020-08-14 LAB — BASIC METABOLIC PANEL
Anion gap: 11 (ref 5–15)
Anion gap: 14 (ref 5–15)
BUN: 14 mg/dL (ref 6–20)
BUN: 15 mg/dL (ref 6–20)
CO2: 24 mmol/L (ref 22–32)
CO2: 23 mmol/L (ref 22–32)
Calcium: 8.3 mg/dL — ABNORMAL LOW (ref 8.9–10.3)
Calcium: 8.4 mg/dL — ABNORMAL LOW (ref 8.9–10.3)
Chloride: 105 mmol/L (ref 98–111)
Chloride: 104 mmol/L (ref 98–111)
Creatinine, Ser: 1.69 mg/dL — ABNORMAL HIGH (ref 0.44–1.00)
Creatinine, Ser: 1.66 mg/dL — ABNORMAL HIGH (ref 0.44–1.00)
GFR, Estimated: 33 mL/min — ABNORMAL LOW (ref >60)
GFR, Estimated: 34 mL/min — ABNORMAL LOW (ref >60)
Glucose, Bld: 392 mg/dL — ABNORMAL HIGH (ref 70–99)
Glucose, Bld: 233 mg/dL — ABNORMAL HIGH (ref 70–99)
Potassium: 3.1 mmol/L — ABNORMAL LOW (ref 3.5–5.1)
Potassium: 3 mmol/L — ABNORMAL LOW (ref 3.5–5.1)
Sodium: 140 mmol/L (ref 135–145)
Sodium: 141 mmol/L (ref 135–145)

## 2020-08-14 LAB — CBG MONITORING, ED
Glucose-Capillary: 143 mg/dL — ABNORMAL HIGH (ref 70–99)
Glucose-Capillary: 166 mg/dL — ABNORMAL HIGH (ref 70–99)
Glucose-Capillary: 178 mg/dL — ABNORMAL HIGH (ref 70–99)
Glucose-Capillary: 179 mg/dL — ABNORMAL HIGH (ref 70–99)
Glucose-Capillary: 188 mg/dL — ABNORMAL HIGH (ref 70–99)
Glucose-Capillary: 214 mg/dL — ABNORMAL HIGH (ref 70–99)
Glucose-Capillary: 216 mg/dL — ABNORMAL HIGH (ref 70–99)
Glucose-Capillary: 254 mg/dL — ABNORMAL HIGH (ref 70–99)
Glucose-Capillary: 256 mg/dL — ABNORMAL HIGH (ref 70–99)
Glucose-Capillary: 463 mg/dL — ABNORMAL HIGH (ref 70–99)
Glucose-Capillary: 520 mg/dL (ref 70–99)
Glucose-Capillary: 529 mg/dL (ref 70–99)

## 2020-08-14 LAB — GLUCOSE, CAPILLARY
Glucose-Capillary: 121 mg/dL — ABNORMAL HIGH (ref 70–99)
Glucose-Capillary: 109 mg/dL — ABNORMAL HIGH (ref 70–99)

## 2020-08-14 LAB — MAGNESIUM: Magnesium: 1.7 mg/dL (ref 1.7–2.4)

## 2020-08-14 LAB — MRSA PCR SCREENING: MRSA by PCR: NEGATIVE

## 2020-08-14 LAB — APTT
aPTT: 25 seconds (ref 24–36)
aPTT: 63 seconds — ABNORMAL HIGH (ref 24–36)

## 2020-08-14 LAB — TSH: TSH: 0.356 u[IU]/mL (ref 0.350–4.500)

## 2020-08-14 LAB — PROCALCITONIN: Procalcitonin: 0.15 ng/mL

## 2020-08-14 LAB — BETA-HYDROXYBUTYRIC ACID
Beta-Hydroxybutyric Acid: 0.09 mmol/L (ref 0.05–0.27)
Beta-Hydroxybutyric Acid: 0.1 mmol/L (ref 0.05–0.27)

## 2020-08-14 LAB — HEPARIN LEVEL (UNFRACTIONATED)
Heparin Unfractionated: <0.1 IU/mL — ABNORMAL LOW (ref 0.30–0.70)
Heparin Unfractionated: 0.38 IU/mL (ref 0.30–0.70)

## 2020-08-14 MED ORDER — CHLORHEXIDINE GLUCONATE CLOTH 2 % EX PADS
6.0000 | MEDICATED_PAD | Freq: Every day | CUTANEOUS | Status: DC
  Administered 2020-08-14 – 2020-08-24 (×12): 6 via TOPICAL

## 2020-08-14 MED ORDER — ORAL CARE MOUTH RINSE
15.0000 mL | OROMUCOSAL | Status: DC
  Administered 2020-08-14 – 2020-08-20 (×59): 15 mL via OROMUCOSAL

## 2020-08-14 MED ORDER — INSULIN ASPART 100 UNIT/ML ~~LOC~~ SOLN
0.0000 [IU] | SUBCUTANEOUS | Status: DC
  Administered 2020-08-14: 2 [IU] via SUBCUTANEOUS
  Administered 2020-08-14: 5 [IU] via SUBCUTANEOUS
  Administered 2020-08-14 – 2020-08-15 (×2): 2 [IU] via SUBCUTANEOUS
  Administered 2020-08-15: 3 [IU] via SUBCUTANEOUS
  Administered 2020-08-16: 5 [IU] via SUBCUTANEOUS
  Administered 2020-08-16 (×2): 8 [IU] via SUBCUTANEOUS
  Administered 2020-08-16: 3 [IU] via SUBCUTANEOUS
  Administered 2020-08-16: 8 [IU] via SUBCUTANEOUS
  Administered 2020-08-17 (×2): 3 [IU] via SUBCUTANEOUS
  Administered 2020-08-17 (×2): 5 [IU] via SUBCUTANEOUS
  Administered 2020-08-17: 8 [IU] via SUBCUTANEOUS
  Administered 2020-08-18: 15 [IU] via SUBCUTANEOUS
  Administered 2020-08-18: 5 [IU] via SUBCUTANEOUS
  Administered 2020-08-18: 3 [IU] via SUBCUTANEOUS
  Administered 2020-08-18: 8 [IU] via SUBCUTANEOUS
  Administered 2020-08-19: 3 [IU] via SUBCUTANEOUS
  Administered 2020-08-19: 8 [IU] via SUBCUTANEOUS
  Administered 2020-08-19: 5 [IU] via SUBCUTANEOUS
  Administered 2020-08-19: 11 [IU] via SUBCUTANEOUS
  Administered 2020-08-19: 5 [IU] via SUBCUTANEOUS
  Administered 2020-08-19: 8 [IU] via SUBCUTANEOUS
  Administered 2020-08-20: 3 [IU] via SUBCUTANEOUS
  Administered 2020-08-20: 5 [IU] via SUBCUTANEOUS
  Administered 2020-08-20: 8 [IU] via SUBCUTANEOUS
  Filled 2020-08-14 (×2): qty 1

## 2020-08-14 MED ORDER — ACETAMINOPHEN 650 MG RE SUPP
650.0000 mg | Freq: Once | RECTAL | Status: AC
  Administered 2020-08-14: 650 mg via RECTAL
  Filled 2020-08-14: qty 1

## 2020-08-14 MED ORDER — PANTOPRAZOLE SODIUM 40 MG IV SOLR
40.0000 mg | INTRAVENOUS | Status: DC
  Administered 2020-08-14 – 2020-08-15 (×2): 40 mg via INTRAVENOUS
  Filled 2020-08-14 (×2): qty 40

## 2020-08-14 MED ORDER — AMIODARONE LOAD VIA INFUSION
150.0000 mg | Freq: Once | INTRAVENOUS | Status: AC
  Administered 2020-08-14: 150 mg via INTRAVENOUS
  Filled 2020-08-14: qty 83.34

## 2020-08-14 MED ORDER — VANCOMYCIN HCL 1250 MG/250ML IV SOLN
1250.0000 mg | INTRAVENOUS | Status: DC
  Administered 2020-08-15: 1250 mg via INTRAVENOUS
  Filled 2020-08-14: qty 250

## 2020-08-14 MED ORDER — SODIUM CHLORIDE 0.9 % IV BOLUS
1000.0000 mL | Freq: Once | INTRAVENOUS | Status: AC
  Administered 2020-08-14: 1000 mL via INTRAVENOUS

## 2020-08-14 MED ORDER — SODIUM CHLORIDE 0.9 % IV SOLN
600.0000 mg | Freq: Once | INTRAVENOUS | Status: DC
  Filled 2020-08-14: qty 6

## 2020-08-14 MED ORDER — METOPROLOL TARTRATE 5 MG/5ML IV SOLN
5.0000 mg | Freq: Four times a day (QID) | INTRAVENOUS | Status: DC
  Administered 2020-08-14 – 2020-08-15 (×2): 5 mg via INTRAVENOUS
  Filled 2020-08-14 (×2): qty 5

## 2020-08-14 MED ORDER — INSULIN ASPART 100 UNIT/ML ~~LOC~~ SOLN: 0.0000 [IU] | Freq: Three times a day (TID) | SUBCUTANEOUS | Status: DC

## 2020-08-14 MED ORDER — METOPROLOL TARTRATE 5 MG/5ML IV SOLN: 5.0000 mg | Freq: Four times a day (QID) | INTRAVENOUS | Status: DC

## 2020-08-14 MED ORDER — ORAL CARE MOUTH RINSE: 15.0000 mL | OROMUCOSAL | Status: DC

## 2020-08-14 MED ORDER — SODIUM CHLORIDE 0.9 % IV SOLN
2.0000 g | Freq: Two times a day (BID) | INTRAVENOUS | Status: DC
  Administered 2020-08-14 (×2): 2 g via INTRAVENOUS
  Filled 2020-08-14 (×2): qty 2

## 2020-08-14 MED ORDER — AMIODARONE HCL IN DEXTROSE 360-4.14 MG/200ML-% IV SOLN
30.0000 mg/h | INTRAVENOUS | Status: DC
  Administered 2020-08-14 (×2): 30 mg/h via INTRAVENOUS
  Filled 2020-08-14 (×3): qty 200

## 2020-08-14 MED ORDER — THIAMINE HCL 100 MG/ML IJ SOLN
100.0000 mg | Freq: Every day | INTRAMUSCULAR | Status: DC
  Administered 2020-08-14 – 2020-08-15 (×2): 100 mg via INTRAVENOUS
  Filled 2020-08-14 (×2): qty 2

## 2020-08-14 MED ORDER — MAGNESIUM SULFATE IN D5W 1-5 GM/100ML-% IV SOLN
1.0000 g | Freq: Once | INTRAVENOUS | Status: AC
  Administered 2020-08-14: 1 g via INTRAVENOUS
  Filled 2020-08-14: qty 100

## 2020-08-14 MED ORDER — CHLORHEXIDINE GLUCONATE 0.12% ORAL RINSE (MEDLINE KIT)
15.0000 mL | Freq: Two times a day (BID) | OROMUCOSAL | Status: DC
  Administered 2020-08-14 – 2020-08-24 (×21): 15 mL via OROMUCOSAL

## 2020-08-14 MED ORDER — POTASSIUM CHLORIDE 20 MEQ/15ML (10%) PO SOLN
40.0000 meq | Freq: Once | ORAL | Status: AC
  Administered 2020-08-14: 40 meq
  Filled 2020-08-14: qty 30

## 2020-08-14 MED ORDER — AMIODARONE HCL IN DEXTROSE 360-4.14 MG/200ML-% IV SOLN
60.0000 mg/h | INTRAVENOUS | Status: AC
  Administered 2020-08-14 (×2): 60 mg/h via INTRAVENOUS

## 2020-08-14 MED ORDER — POTASSIUM CHLORIDE 10 MEQ/100ML IV SOLN
10.0000 meq | INTRAVENOUS | Status: AC
  Administered 2020-08-14 (×4): 10 meq via INTRAVENOUS
  Filled 2020-08-14 (×4): qty 100

## 2020-08-14 MED ORDER — IBUPROFEN 100 MG/5ML PO SUSP
600.0000 mg | Freq: Once | ORAL | Status: AC
  Administered 2020-08-14: 600 mg
  Filled 2020-08-14: qty 30

## 2020-08-14 MED ORDER — CHLORHEXIDINE GLUCONATE 0.12% ORAL RINSE (MEDLINE KIT): 15.0000 mL | Freq: Two times a day (BID) | OROMUCOSAL | Status: DC

## 2020-08-14 MED ORDER — HEPARIN (PORCINE) 25000 UT/250ML-% IV SOLN
1000.0000 [IU]/h | INTRAVENOUS | Status: DC
  Administered 2020-08-14: 1000 [IU]/h via INTRAVENOUS
  Filled 2020-08-14: qty 250

## 2020-08-14 MED ORDER — METRONIDAZOLE IN NACL 5-0.79 MG/ML-% IV SOLN
500.0000 mg | Freq: Three times a day (TID) | INTRAVENOUS | Status: DC
  Administered 2020-08-14 – 2020-08-15 (×2): 500 mg via INTRAVENOUS
  Filled 2020-08-14 (×2): qty 100

## 2020-08-14 NOTE — ED Notes (Signed)
Date and time results received: 08/14/20 0136   Test: Lactic acid Critical Value: 2.5  Name of Provider Notified: Audria Nine  Orders Received? Or Actions Taken? NA

## 2020-08-14 NOTE — Progress Notes (Signed)
*  PRELIMINARY RESULTS* Echocardiogram 2D Echocardiogram has been performed.  [name] 08/14/2020, 11:58 AM

## 2020-08-14 NOTE — Progress Notes (Signed)
Report given to Fredric Mare, RN on 2W at [facility].

## 2020-08-14 NOTE — Consult Note (Addendum)
NAME:  [name], MRN:  [id number], DOB:  April 21, 1966, LOS: 1 ADMISSION DATE:  08/13/2020, CONSULTATION DATE:  08/14/20 REFERRING MD:  edp, CHIEF COMPLAINT:  Acute resp    Brief History   54 yobf smoker with HBP no known sz disorder to ER Pm 10/20 with ams then sz with w/u pos for AG met acidosis/ fever to 102 ? Source and rec to trx to [facility] when bed available / PCCM service at [facility] asked to consult awaiting trx.   History of present illness    54 y.o. female   presented to the emergency department 8:30 pm  after being found unresponsive by her boyfriend.  He last saw her well earlier in the evening of 10/19.  He states he went to get something to eat and then came back and found her unresponsive.  EMS arrived to find the patient with seizure activity which was intermittent.  They established IV access and transported her immediately to the emergency department.  She had nonrebreather on arrival.  She would awaken to pain but not provide any verbal response.  Unknown history of seizure.  Boyfriend and family are unsure of the patient's exact medical history. Pt started gm sz on arrival and was intubated and placed on diprovan drip with labs c/w Met acidosis ? dka > CT head neg and arranged for tx to [facility] pm 10/20 with no furhter sz and temp to 102 s source identified.  Past Medical History   Alcohol use  Anxiety Chronic anemia Chronic combined systolic and diastolic CHF (congestive heart failure) (HCC)  CKD (chronic kidney disease), stage III (HCC)  Cocaine abuse (HCC) COPD (chronic obstructive pulmonary disease) ([place]) Diabetes mellitus, type 2 ([place])  Diabetic Charcot foot ([place])  Essential hypertension  Morbid obesity ([place]) (01/31/2008)  Noncompliance with medication regimen PAT (paroxysmal atrial tachycardia) (HCC) Sleep apnea Stroke ([place]), Tobacco abuse Urinary incontinence.    Significant Hospital Events   Afib 10/19 pm > amiodarone rx   Consults:  Neurology  10/19 PCCM         10/20   Procedures:    Significant Diagnostic Tests:  CT head 10/19  Neg UDS   10/19  Pos cocaine, etoh  < 10    Micro Data:  Covid 19  PCR   10/19   Neg Flu  A/B   PCR    10/19   Neg  BC x 2    10/19 neg  U/a  Neg leuk/nitries    Antimicrobials:  Cefepime  10/19>>> Flagyl  10/19 >>> Vanc    10/19    Scheduled Meds: . insulin aspart  0-15 Units Subcutaneous Q4H  . pantoprazole (PROTONIX) IV  40 mg Intravenous Q24H   Continuous Infusions: . amiodarone 30 mg/hr (08/14/20 1226)  . ceFEPime (MAXIPIME) IV 2 g (08/14/20 1023)  . dextrose 5% lactated ringers 75 mL/hr at 08/14/20 1150  . heparin    . lactated ringers 125 mL/hr at 08/14/20 1100  . levETIRAcetam Stopped (08/14/20 0950)  . metronidazole 500 mg (08/14/20 0910)  . potassium chloride 10 mEq (08/14/20 1334)  . propofol (DIPRIVAN) infusion 30 mcg/kg/min (08/14/20 1339)  . [START ON 08/15/2020] vancomycin     PRN Meds:.dextrose   Interim history/subjective:  Sedated on vent / no further sz   Objective   Blood pressure 98/84, pulse 89, temperature 99.1 F (37.3 C), resp. rate (!) 21, height '5\' 1"'  (1.549 m), weight 82.6 kg, last menstrual period 05/19/2016, SpO2 100 %.  Vent Mode: PRVC FiO2 (%):  [30 %-50 %] 30 % Set Rate:  [16 bmp] 16 bmp Vt Set:  [380 mL] 380 mL PEEP:  [5 cmH20] 5 cmH20 Plateau Pressure:  [14 cmH20-17 cmH20] 16 cmH20   Intake/Output Summary (Last 24 hours) at 08/14/2020 1356 Last data filed at 08/14/2020 1333 Gross per 24 hour  Intake 4759.15 ml  Output 600 ml  Net 4159.15 ml   Filed Weights   08/14/20 1300  Weight: 82.6 kg    Examination: Tmax 102.2  General: sedated on vent / chemosis L >R  HENT: ET in place Lungs: clear to A and P Cardiovascular: RRR no S3 Abdomen: obese soft Extremities: warm s edema Neuro: sedated, no nystagmus   I personally reviewed images and agree with radiology impression as follows:  CXR:  08/12/20   Et too low/ bilateral lower lobe atx  changes     Resolved Hospital Problem list      Assessment & Plan:  1) Acute resp failure in setting of new onset sz/ mild dka - ET a bit low, rec pull back 2 cm at lip / present settings ok and we'll  - not air trapping on present settings > no changes needed   2) New onset SZ  Assoc with febrile illness ? Source with UDS pos cocaine in pt with etoh hx with none detectable on admit >>>  already on broad abx - need to consider meningitis in ddx but practically would be very difficult to LP so just rx empirically for now  - rx kepra per Neuro initiaed 10/19  - rx thiamine in case this was etoh related sz / continue diprovan  3) Atrial fibrillation  - amiodarone started / full anticoagulation ordered  4) Lactic acidosis the major cause for AG acidosis and since not in shock likely this was sz related and clearing on its own nicely, a sign of a healthy liver and adequate perfusion so plan to keep hydrated in absence of pulmonary edema on cxr.   5) CRI  Lab Results  Component Value Date   CREATININE 1.66 (H) 08/14/2020   CREATININE 1.69 (H) 08/14/2020   CREATININE 1.88 (H) 08/13/2020        Creat                                                             1.41                                  11/25/2018  6) Hypokalemia  - Replace per [facility]    Best practice:  Diet: npo Pain/Anxiety/Delirium protocol (if indicated): diprovan VAP protocol (if indicated):  DVT prophylaxis: Hep IV  GI prophylaxis: ppi Glucose control: per [facility] Mobility: sbr Code Status: full code Family Communication: per [facility] Disposition: to [facility]   Labs   CBC: Recent Labs  Lab 08/13/20 2044  WBC 10.9*  NEUTROABS 8.0*  HGB 15.0  HCT 47.1*  MCV 92.5  PLT 176    Basic Metabolic Panel: Recent Labs  Lab 08/13/20 2044 08/14/20 0253 08/14/20 0618  NA 135 140 141  K 2.6* 3.1* 3.0*  CL 94* 105 104  CO2 20* 24 23  GLUCOSE 772* 392* 233*  BUN '14 14 15  ' CREATININE 1.88* 1.69* 1.66*  CALCIUM 8.9  8.3* 8.4*  MG  --   --  1.7   GFR: Estimated Creatinine Clearance: 36.9 mL/min (A) (by C-G formula based on SCr of 1.66 mg/dL (H)). Recent Labs  Lab 08/13/20 2044 08/14/20 0002 08/14/20 0940 08/14/20 1223  WBC 10.9*  --   --   --   LATICACIDVEN 10.1* 5.2* 3.0* 2.5*    Liver Function Tests: Recent Labs  Lab 08/13/20 2044  AST 19  ALT 16  ALKPHOS 112  BILITOT 0.9  PROT 6.9  ALBUMIN 3.1*   No results for input(s): LIPASE, AMYLASE in the last 168 hours. No results for input(s): AMMONIA in the last 168 hours.  ABG    Component Value Date/Time   PHART 7.348 (L) 08/13/2020 2256   PCO2ART 46.0 08/13/2020 2256   PO2ART 205 (H) 08/13/2020 2256   HCO3 23.8 08/13/2020 2256   TCO2 17 09/11/2014 1152   ACIDBASEDEF 0.3 08/13/2020 2256   O2SAT 99.9 08/13/2020 2256     Coagulation Profile: Recent Labs  Lab 08/13/20 2044  INR 1.0    Cardiac Enzymes: No results for input(s): CKTOTAL, CKMB, CKMBINDEX, TROPONINI in the last 168 hours.  HbA1C: Hgb A1c MFr Bld  Date/Time Value Ref Range Status  05/09/2019 09:56 AM 8.3 (H) 4.8 - 5.6 % Final    Comment:    (NOTE) Pre diabetes:          5.7%-6.4% Diabetes:              >6.4% Glycemic control for   <7.0% adults with diabetes   11/23/2018 03:03 AM 7.0 (H) 4.8 - 5.6 % Final    Comment:    (NOTE) Pre diabetes:          5.7%-6.4% Diabetes:              >6.4% Glycemic control for   <7.0% adults with diabetes     CBG: Recent Labs  Lab 08/14/20 0531 08/14/20 0642 08/14/20 0744 08/14/20 1110 08/14/20 1203  GLUCAP 179* 178* 166* 216* 188*      Surgical History    Past Surgical History:  Procedure Laterality Date  . BREAST BIOPSY    . CARDIAC CATHETERIZATION N/A 07/28/2016   Procedure: Left Heart Cath and Coronary Angiography;  Surgeon: Jettie Booze, MD;  Location: [place] [facility];  Service: Cardiovascular;  Laterality: N/A;  . COLONOSCOPY N/A 05/10/2019   Procedure: COLONOSCOPY;  Surgeon: Danie Binder, MD;  [facility];  Service: Endoscopy;  Laterality: N/A;  Phenergan 12.5 mg IV in pre-op  . DILATION AND CURETTAGE OF UTERUS    . I & D EXTREMITY Bilateral 09/22/2017   Procedure: BILATERAL DEBRIDEMENT LEG/FOOT ULCERS, APPLY VERAFLO WOUND VAC;  Surgeon: Newt Minion, MD;  Location: [place];  Service: Orthopedics;  Laterality: Bilateral;  . I & D EXTREMITY Right 10/11/2018   Procedure: IRRIGATION AND DEBRIDEMENT RIGHT HAND;  Surgeon: [name], MD;  Location: [place];  Service: Orthopedics;  Laterality: Right;  . I & D EXTREMITY Right 10/13/2018   Procedure: REPEAT IRRIGATION AND DEBRIDEMENT RIGHT HAND;  Surgeon: [name], MD;  Location: [place];  Service: Orthopedics;  Laterality: Right;  . I & D EXTREMITY Right 11/22/2018   Procedure: IRRIGATION AND DEBRIDEMENT AND PINNING RIGHT HAND;  Surgeon: [name], MD;  Location: [place];  Service: Orthopedics;  Laterality: Right;  . IR RADIOLOGIST EVAL & MGMT  07/05/2018  .  POLYPECTOMY  05/10/2019   Procedure: POLYPECTOMY;  Surgeon: Danie Binder, MD;  [facility];  Service: Endoscopy;;  . SKIN SPLIT GRAFT Bilateral 09/28/2017   Procedure: BILATERAL SPLIT THICKNESS SKIN GRAFT LEGS/FEET AND APPLY VAC;  Surgeon: Newt Minion, MD;  Location: [place];  Service: Orthopedics;  Laterality: Bilateral;  . SKIN SPLIT GRAFT Right 11/22/2018   Procedure: SKIN GRAFT SPLIT THICKNESS;  Surgeon: [name], MD;  Location: [place];  Service: Orthopedics;  Laterality: Right;     Social History   reports that she has been smoking cigarettes. She has a 20.00 pack-year smoking history. She has never used smokeless tobacco. She reports previous drug use. Frequency: 3.00 times per week. Drugs: Marijuana and Cocaine. She reports that she does not drink alcohol.   Family History   Her family history includes Arthritis in an other family member; Asthma in an other family member; Cancer in her sister and another family member; Diabetes in  an other family member; Heart attack in her mother; Hypertension in her brother, daughter, father, mother, sister, sister, and son.   Allergies No Known Allergies   Home Medications  Prior to Admission medications   Medication Sig Start Date End Date Taking? Authorizing Provider  albuterol (PROAIR HFA) 108 (90 Base) MCG/ACT inhaler INHALE 2 PUFFS EVERY 6 HOURS AS NEEDED FOR SHORTNESS OF BREATH/WHEEZING. Patient taking differently: Inhale 1-2 puffs into the lungs every 6 (six) hours as needed for shortness of breath.  08/05/20  Yes [name], MD  amLODipine (NORVASC) 10 MG tablet TAKE 1 TABLET BY MOUTH ONCE A DAY. Patient taking differently: Take 10 mg by mouth daily.  07/19/20  Yes [name], MD  cloNIDine (CATAPRES) 0.3 MG tablet TAKE 1 TABLET BY MOUTH AT BEDTIME FOR BLOOD PRESSURE Patient taking differently: Take 0.3 mg by mouth at bedtime.  07/19/20  Yes [name], MD  clopidogrel (PLAVIX) 75 MG tablet TAKE 1 TABLET BY MOUTH DAILY WITH BREAKFAST. Patient taking differently: Take 75 mg by mouth daily.  07/19/20  Yes [name], MD  Dextromethorphan-guaiFENesin (DELSYM COUGH/CHEST CONGEST DM PO) Take 5 mLs by mouth every 12 (twelve) hours as needed (cough).   Yes [provider]  DULoxetine (CYMBALTA) 30 MG capsule TAKE 2 CAPSULES BY MOUTH ONCE DAILY. Patient taking differently: Take 60 mg by mouth daily.  07/19/20  Yes [name], MD  ezetimibe (ZETIA) 10 MG tablet TAKE 1 TABLET BY MOUTH ONCE A DAY. Patient taking differently: Take 10 mg by mouth daily.  07/19/20  Yes [name], MD  furosemide (LASIX) 20 MG tablet TAKE 1 TABLET BY MOUTH ONCE A DAY. Patient taking differently: Take 20 mg by mouth daily.  07/19/20  Yes [name], MD  gabapentin (NEURONTIN) 300 MG capsule TAKE 1 CAPSULE BY MOUTH THREE TIMES A DAY. Patient taking differently: Take 300 mg by mouth 3 (three) times daily.  11/14/19  Yes Newt Minion, MD    hydrOXYzine (ATARAX/VISTARIL) 25 MG tablet TAKE 1 TABLET BY MOUTH AT BEDTIME FOR ITCHING AND SLEEP. Patient taking differently: Take 25 mg by mouth at bedtime.  07/19/20  Yes [name], MD  metoprolol succinate (TOPROL-XL) 50 MG 24 hr tablet TAKE 1 TABLET BY MOUTH TWICE A DAY. Patient taking differently: Take 50 mg by mouth in the morning and at bedtime.  08/13/20  Yes [name], MD  montelukast (SINGULAIR) 10 MG tablet TAKE (1) TABLET BY MOUTH AT BEDTIME. Patient  taking differently: Take 10 mg by mouth at bedtime.  07/19/20  Yes [name], MD  olmesartan (BENICAR) 40 MG tablet TAKE 1 TABLET BY MOUTH ONCE A DAY. Patient taking differently: Take 40 mg by mouth daily.  08/13/20  Yes [name], MD  pantoprazole (PROTONIX) 40 MG tablet TAKE 1 TABLET BY MOUTH ONCE A DAY. Patient taking differently: Take 40 mg by mouth daily.  07/19/20  Yes [name], MD  potassium chloride SA (KLOR-CON) 20 MEQ tablet TAKE 1&1/2 TABLETS (30MEQ) BY MOUTH DAILY. Patient taking differently: Take 30 mEq by mouth daily.  07/19/20  Yes [name], MD  rosuvastatin (CRESTOR) 10 MG tablet TAKE 1 TABLET BY MOUTH ONCE A DAY. Patient taking differently: Take 10 mg by mouth daily.  07/19/20  Yes [name], MD  solifenacin (VESICARE) 5 MG tablet TAKE 1 TABLET BY MOUTH ONCE DAILY. Patient taking differently: Take 5 mg by mouth daily.  07/19/20  Yes [name], MD  TRADJENTA 5 MG TABS tablet TAKE 1 TABLET BY MOUTH ONCE DAILY. Patient taking differently: Take 5 mg by mouth daily.  08/13/20  Yes [name], MD  traZODone (DESYREL) 50 MG tablet TAKE (1) TABLET BY MOUTH AT BEDTIME. Patient taking differently: Take 50 mg by mouth at bedtime.  07/19/20  Yes [name], MD  apixaban (ELIQUIS) 5 MG TABS tablet Take 5 mg by mouth 2 (two) times daily. Patient not taking: Reported on 08/14/2020    [provider]  tiotropium (SPIRIVA  HANDIHALER) 18 MCG inhalation capsule Place 18 mcg into inhaler and inhale daily as needed (Shortness of breath).  Patient not taking: Reported on 08/14/2020    [provider]        The patient is critically ill with multiple organ systems failure and requires high complexity decision making for assessment and support, frequent evaluation and titration of therapies, application of advanced monitoring technologies and extensive interpretation of multiple databases. Critical Care Time devoted to patient care services described in this note is 45 minutes.    Christinia Gully, MD [facility] [place] [phone]   After 7:00 pm call Elink  [phone]

## 2020-08-14 NOTE — ED Provider Notes (Signed)
Signout from Dr. Tomi Bamberger.  54 year old diabetic presented last evening with seizures altered mental status fever.  Rapid A. fib.  DKA.  Currently intubated on propofol for sedation.  Insulin drip has closed her gap and have just ordered her to turn off the insulin.  Remains on amiodarone for rapid A. fib and heart rate around 100.  Blood pressure soft in the 90s.  Has had no further seizure activity.  Potassium and magnesium low when oral and IV of been ordered. Physical Exam  BP 94/78   Pulse 82   Temp (!) 100.9 F (38.3 C)   Resp (!) 22   LMP 05/19/2016   SpO2 100%   Physical Exam  ED Course/Procedures   Clinical Course as of Aug 14 817  Tue Aug 13, 2020  2328 CBG monitoring, ED(!!) [JL]    Clinical Course User Index [JL] Long, Wonda Olds, MD    .Critical Care Performed by: Hayden Rasmussen, MD Authorized by: Hayden Rasmussen, MD   Critical care provider statement:    Critical care time (minutes):  45   Critical care time was exclusive of:  Separately billable procedures and treating other patients   Critical care was necessary to treat or prevent imminent or life-threatening deterioration of the following conditions:  CNS failure or compromise and metabolic crisis   Critical care was time spent personally by me on the following activities:  Discussions with consultants, evaluation of patient's response to treatment, examination of patient, ordering and performing treatments and interventions, ordering and review of laboratory studies, ordering and review of radiographic studies, pulse oximetry, re-evaluation of patient's condition, obtaining history from patient or surrogate and review of old charts    MDM  Discussed with critical care Dr. Melvyn Novas.  He feels that she does not need transfer to [facility] at this time and would recommend getting her admitted here at [facility].  He said he will be available after 12:00 to help consult with the hospitalist.  Patient has been mostly  normothermic during my shift.  I asked the hospitalist to see her so we could still have some continuity of care with getting labs.  I checked with pharmacy and got her antibiotics ordered.  Dr. Melvyn Novas rounded on her also.  Hopefully she will get a bed at [facility] soon.      Hayden Rasmussen, MD 08/14/20 1806

## 2020-08-14 NOTE — ED Provider Notes (Addendum)
11:45 PM nurses state patient heart rate has gone up to 150.  They states she had low-grade temperature of 100.  She has been already getting IV fluids because of her DKA.  Patient had already been ordered to have rectal Tylenol.  She is also being started on antibiotics.  ED ECG REPORT   Date: 08/14/2020  Rate: 152  Rhythm: sinus tachycardia  QRS Axis: normal  Intervals: normal and QT prolonged  ST/T Wave abnormalities: nonspecific ST/T changes  Conduction Disutrbances:none  Narrative Interpretation:   Old EKG Reviewed: none available  I have personally reviewed the EKG tracing and agree with the computerized printout as noted.      12:15 AM nurse reports heart rate has come back down to around 109.  Her first lactic acid has come back at 10.  We will see what the repeat shows hopefully with the IV fluids will be improved.  Her repeat lactic acid improved to 5.2 with IV fluids.  5:00 AM nurse had me recheck patient, her heart rate varies from 105 up to 150s and is irregular like atrial fibrillation.  Her blood pressure when I entered the room was 88/56 however when we rechecked it it was 100/61.  Her oxygen is 100% on the vent.  Her temperature is still 101.8 despite getting acetaminophen rectally (second dose) almost an hour before.  Her CBG now is down to 214 and her potassium has improved up to 3.1.  I am going to speak to the critical care about how to manage her atrial fib.  I am afraid if we put her on anything to manage her rate she is to be going to become more hypotensive.  She still has a significant fever after getting rectal Tylenol, I will go ahead and try IV Motrin.  5:50 AM patient was discussed with Dr. Oletta Darter, critical care.  He states he started on amiodarone bolus and drip for her atrial fib.  This will be less likely to make her hypotensive.  Due to her DKA while I was talking to her her blood pressure dropped again and he recommended another liter of normal saline.   Her potassium is still low at 3.1.  We discussed putting medications down her NG tube and she was given potassium 40 mEq per NG tube.  I was going to give her IV Motrin however he suggested given her Motrin through her NG tube and that was done.  He wants the magnesium to be checked and wants the magnesium over 2 and her potassium over 4 is at goal.  If she does not respond to the fluid bolus he recommends using phenyl ephedrine peripherally.  6:45 AM temperature 101.5, heart rate 92, blood pressure 102/73, pulse ox 98% on the ventilator.  Patient CBG was 178.  Her magnesium was 1.7, Dr. Emmit Alexanders wanted her magnesium to be over 2.  She was given 1 g IV.  Pt turned over to Dr Melina Copa at change of shift.   CRITICAL CARE Performed by: Jaimarie Rapozo L Jeiden Daughtridge Total critical care time: 45 minutes Critical care time was exclusive of separately billable procedures and treating other patients. Critical care was necessary to treat or prevent imminent or life-threatening deterioration. Critical care was time spent personally by me on the following activities: development of treatment plan with patient and/or surrogate as well as nursing, discussions with consultants, evaluation of patient's response to treatment, examination of patient, obtaining history from patient or surrogate, ordering and performing treatments and interventions, ordering and  review of laboratory studies, ordering and review of radiographic studies, pulse oximetry and re-evaluation of patient's condition.  Diagnoses that have been ruled out:  None  Diagnoses that are still under consideration:  None  Final diagnoses:  Diabetic ketoacidosis with coma associated with type 2 diabetes mellitus ([place])  Status epilepticus ([place])  Fever, unspecified fever cause  Lactic acid acidosis  Hypokalemia  Atrial fibrillation with rapid ventricular response ([place])  Hypotension due to hypovolemia   Plan admission waiting for a bed in [place], [place]  Rolland Porter,  MD, Barbette Or, MD 08/14/20 0746    Rolland Porter, MD 08/14/20 0730

## 2020-08-14 NOTE — ED Notes (Signed)
ED Provider at bedside- updated on pt current bp, intermittent increased hr (afib 150's, that last briefly then returns to 110's 120's), temp, decreased sedation.

## 2020-08-14 NOTE — Consult Note (Addendum)
Medical Consultation   [name]  DIY:[id number]  DOB: 23-Jun-1966  DOA: 08/13/2020  PCP: [name], MD   Requesting physician: Dr. Melina Copa  Reason for consultation: Medical Management    History of Present Illness: [name] is an 54 y.o. female with history of hypertension, type 2 diabetes, paroxysmal atrial fibrillation on Eliquis, history of seizures, tobacco/polysubstance abuse, nonischemic cardiomyopathy with LVEF 30-35% and grade 2 diastolic dysfunction, CKD stage IIIa, depression, prior CVA, and GERD who presented to the ED after she was found unresponsive by her boyfriend.  She was last seen well earlier in the evening of 10/19.  He apparently went to get something to eat when he came back and found her unresponsive.  EMS was called and they had arrived to find her with seizure activity that was intermittent.  IV access was established and she was transported immediately to the emergency department for further assessment.  Given her condition, she underwent rapid sequence intubation and has been placed on the ventilator with propofol for sedation.  Her labs have returned demonstrating blood glucose in the 700 range with signs of DKA noted for which she was started on IV fluid and insulin drip.  She was also noted to have an elevated temperature and lactic acidosis and was started on vancomycin, cefepime, and Flagyl.  Additionally, she was noted to have atrial fibrillation with RVR on EKG with associated soft blood pressure readings for which she was started on amiodarone drip.  PCCM was called and has accepted patient for admission to their service, unfortunately but has not opened up as of yet and she continues to wait in the [facility], ED.  Teleneurology has been consulted with recommendations to continue Keppra 1 g twice daily and to obtain routine EEG as well as MRI brain with and without contrast.  CT of the head performed with no acute findings  noted.  Her blood pressures continue to remain soft, but heart rates are stable and she is sedated on the ventilator.  She is unresponsive.  She remains mildly febrile with temperature around 100.8 Fahrenheit.  Review of Systems:  ROS Cannot be obtained given current patient condition.   Past Medical History: Past Medical History:  Diagnosis Date  . Alcohol use   . Ankle fracture, lateral malleolus, closed 12/30/2011  . Anxiety   . Breast mass, left 12/15/2011  . Chronic anemia   . Chronic combined systolic and diastolic CHF (congestive heart failure) ([place])   . CKD (chronic kidney disease), stage III ([place])    pt denies  . Cocaine abuse ([place])   . COPD (chronic obstructive pulmonary disease) ([place])   . Diabetes mellitus, type 2 ([place])   . Diabetic Charcot foot ([place]) 12/30/2011  . Essential hypertension   . History of GI bleed 12/05/2009   Qualifier: Diagnosis of  By: Zeb Comfort    . Hyperlipidemia   . Hypertensive cardiomyopathy ([place]) 11/08/2012   a. 05/2016: echo showing EF of 20-25% with normal cors by cath in 07/2016.  . Morbid obesity ([place]) 01/31/2008   Qualifier: Diagnosis of  By: Lenn Cal    . Noncompliance with medication regimen   . Obesity   . Panic attacks   . PAT (paroxysmal atrial tachycardia) ([place])    a. started on amiodarone 07/2017 for this.  . Sleep apnea    does not use cpap  . Stroke [facility])  06/24/17  . Tobacco abuse   . Urinary incontinence     Past Surgical History: Past Surgical History:  Procedure Laterality Date  . BREAST BIOPSY    . CARDIAC CATHETERIZATION N/A 07/28/2016   Procedure: Left Heart Cath and Coronary Angiography;  Surgeon: Jettie Booze, MD;  Location: [place] CV LAB;  Service: Cardiovascular;  Laterality: N/A;  . COLONOSCOPY N/A 05/10/2019   Procedure: COLONOSCOPY;  Surgeon: Danie Binder, MD;  [facility];  Service: Endoscopy;  Laterality: N/A;  Phenergan 12.5 mg IV in pre-op  . DILATION AND CURETTAGE OF  UTERUS    . I & D EXTREMITY Bilateral 09/22/2017   Procedure: BILATERAL DEBRIDEMENT LEG/FOOT ULCERS, APPLY VERAFLO WOUND VAC;  Surgeon: Newt Minion, MD;  Location: [place];  Service: Orthopedics;  Laterality: Bilateral;  . I & D EXTREMITY Right 10/11/2018   Procedure: IRRIGATION AND DEBRIDEMENT RIGHT HAND;  Surgeon: [name], MD;  Location: [place];  Service: Orthopedics;  Laterality: Right;  . I & D EXTREMITY Right 10/13/2018   Procedure: REPEAT IRRIGATION AND DEBRIDEMENT RIGHT HAND;  Surgeon: [name], MD;  Location: [place];  Service: Orthopedics;  Laterality: Right;  . I & D EXTREMITY Right 11/22/2018   Procedure: IRRIGATION AND DEBRIDEMENT AND PINNING RIGHT HAND;  Surgeon: [name], MD;  Location: [place];  Service: Orthopedics;  Laterality: Right;  . IR RADIOLOGIST EVAL & MGMT  07/05/2018  . POLYPECTOMY  05/10/2019   Procedure: POLYPECTOMY;  Surgeon: Danie Binder, MD;  [facility];  Service: Endoscopy;;  . SKIN SPLIT GRAFT Bilateral 09/28/2017   Procedure: BILATERAL SPLIT THICKNESS SKIN GRAFT LEGS/FEET AND APPLY VAC;  Surgeon: Newt Minion, MD;  Location: [place];  Service: Orthopedics;  Laterality: Bilateral;  . SKIN SPLIT GRAFT Right 11/22/2018   Procedure: SKIN GRAFT SPLIT THICKNESS;  Surgeon: [name], MD;  Location: [place];  Service: Orthopedics;  Laterality: Right;    Allergies:  No Known Allergies   Social History:  reports that she has been smoking cigarettes. She has a 20.00 pack-year smoking history. She has never used smokeless tobacco. She reports previous drug use. Frequency: 3.00 times per week. Drugs: Marijuana and Cocaine. She reports that she does not drink alcohol.   Family History: Family History  Problem Relation Age of Onset  . Hypertension Mother   . Heart attack Mother   . Hypertension Father        CABG   . Hypertension Sister   . Hypertension Brother   . Hypertension Sister   . Cancer Sister        breast   .  Arthritis Other   . Cancer Other   . Diabetes Other   . Asthma Other   . Hypertension Daughter   . Hypertension Son     Physical Exam: Vitals:   08/14/20 0800 08/14/20 0820 08/14/20 0830 08/14/20 0900  BP: 94/78  100/80 99/73  Pulse: 82   90  Resp: (!) 22   (!) 22  Temp: (!) 100.9 F (38.3 C)  (!) 100.8 F (38.2 C) (!) 100.8 F (38.2 C)  SpO2: 100% 100%  100%    Constitutional: Intubated and sedated on mechanical ventilator Neck: neck appears normal CVS: S1-S2 clear  Respiratory:  clear to auscultation bilaterally, currently intubated Abdomen: soft nontender, nondistended, normal bowel sounds, no hepatosplenomegaly, no hernias  Musculoskeletal: : no cyanosis, clubbing or edema noted bilaterally Neuro: Sedated on ventilator Psych: Cannot be assessed Skin: no  rashes or lesions or ulcers, no induration or nodules   Data reviewed:  I have personally reviewed following labs and imaging studies Labs:  CBC: Recent Labs  Lab 08/13/20 2044  WBC 10.9*  NEUTROABS 8.0*  HGB 15.0  HCT 47.1*  MCV 92.5  PLT 102    Basic Metabolic Panel: Recent Labs  Lab 08/13/20 2044 08/13/20 2044 08/14/20 0253 08/14/20 0618  NA 135  --  140 141  K 2.6*   < > 3.1* 3.0*  CL 94*  --  105 104  CO2 20*  --  24 23  GLUCOSE 772*  --  392* 233*  BUN 14  --  14 15  CREATININE 1.88*  --  1.69* 1.66*  CALCIUM 8.9  --  8.3* 8.4*  MG  --   --   --  1.7   < > = values in this interval not displayed.   GFR Estimated Creatinine Clearance: 36.9 mL/min (A) (by C-G formula based on SCr of 1.66 mg/dL (H)). Liver Function Tests: Recent Labs  Lab 08/13/20 2044  AST 19  ALT 16  ALKPHOS 112  BILITOT 0.9  PROT 6.9  ALBUMIN 3.1*   No results for input(s): LIPASE, AMYLASE in the last 168 hours. No results for input(s): AMMONIA in the last 168 hours. Coagulation profile Recent Labs  Lab 08/13/20 2044  INR 1.0    Cardiac Enzymes: No results for input(s): CKTOTAL, CKMB, CKMBINDEX, TROPONINI  in the last 168 hours. BNP: Invalid input(s): POCBNP CBG: Recent Labs  Lab 08/14/20 0254 08/14/20 0417 08/14/20 0531 08/14/20 0642 08/14/20 0744  GLUCAP 256* 214* 179* 178* 166*   D-Dimer No results for input(s): DDIMER in the last 72 hours. Hgb A1c No results for input(s): HGBA1C in the last 72 hours. Lipid Profile No results for input(s): CHOL, HDL, LDLCALC, TRIG, CHOLHDL, LDLDIRECT in the last 72 hours. Thyroid function studies No results for input(s): TSH, T4TOTAL, T3FREE, THYROIDAB in the last 72 hours.  Invalid input(s): FREET3 Anemia work up No results for input(s): VITAMINB12, FOLATE, FERRITIN, TIBC, IRON, RETICCTPCT in the last 72 hours. Urinalysis    Component Value Date/Time   COLORURINE STRAW (A) 08/13/2020 2027   APPEARANCEUR CLEAR 08/13/2020 2027   LABSPEC 1.015 08/13/2020 2027   PHURINE 7.0 08/13/2020 2027   GLUCOSEU >=500 (A) 08/13/2020 2027   HGBUR SMALL (A) 08/13/2020 2027   BILIRUBINUR NEGATIVE 08/13/2020 2027   KETONESUR NEGATIVE 08/13/2020 2027   PROTEINUR >300 (A) 08/13/2020 2027   UROBILINOGEN 0.2 09/11/2014 1319   NITRITE NEGATIVE 08/13/2020 2027   LEUKOCYTESUR NEGATIVE 08/13/2020 2027     Microbiology Recent Results (from the past 240 hour(s))  Respiratory Panel by RT PCR (Flu A&B, Covid) - Nasopharyngeal Swab     Status: None   Collection Time: 08/13/20  8:44 PM   Specimen: Nasopharyngeal Swab  Result Value Ref Range Status   SARS Coronavirus 2 by RT PCR NEGATIVE NEGATIVE Final    Comment: (NOTE) SARS-CoV-2 target nucleic acids are NOT DETECTED.  The SARS-CoV-2 RNA is generally detectable in upper respiratoy specimens during the acute phase of infection. The lowest concentration of SARS-CoV-2 viral copies this assay can detect is 131 copies/mL. A negative result does not preclude SARS-Cov-2 infection and should not be used as the sole basis for treatment or other patient management decisions. A negative result may occur with    improper specimen collection/handling, submission of specimen other than nasopharyngeal swab, presence of viral mutation(s) within the areas targeted by this  assay, and inadequate number of viral copies (<131 copies/mL). A negative result must be combined with clinical observations, patient history, and epidemiological information. The expected result is Negative.  Fact Sheet for Patients:  PinkCheek.be  Fact Sheet for Healthcare Providers:  GravelBags.it  This test is no t yet approved or cleared by the [place] FDA and  has been authorized for detection and/or diagnosis of SARS-CoV-2 by FDA under an Emergency Use Authorization (EUA). This EUA will remain  in effect (meaning this test can be used) for the duration of the COVID-19 declaration under Section 564(b)(1) of the Act, 21 U.S.C. section 360bbb-3(b)(1), unless the authorization is terminated or revoked sooner.     Influenza A by PCR NEGATIVE NEGATIVE Final   Influenza B by PCR NEGATIVE NEGATIVE Final    Comment: (NOTE) The Xpert Xpress SARS-CoV-2/FLU/RSV assay is intended as an aid in  the diagnosis of influenza from Nasopharyngeal swab specimens and  should not be used as a sole basis for treatment. Nasal washings and  aspirates are unacceptable for Xpert Xpress SARS-CoV-2/FLU/RSV  testing.  Fact Sheet for Patients: PinkCheek.be  Fact Sheet for Healthcare Providers: GravelBags.it  This test is not yet approved or cleared by the [place] FDA and  has been authorized for detection and/or diagnosis of SARS-CoV-2 by  FDA under an Emergency Use Authorization (EUA). This EUA will remain  in effect (meaning this test can be used) for the duration of the  Covid-19 declaration under Section 564(b)(1) of the Act, 21  U.S.C. section 360bbb-3(b)(1), unless the authorization is  terminated or  revoked. Performed at [facility], [address] [place], [place] [address]   Blood Culture (routine x 2)     Status: None (Preliminary result)   Collection Time: 08/13/20 11:50 PM   Specimen: BLOOD RIGHT HAND  Result Value Ref Range Status   Specimen Description BLOOD RIGHT HAND  Final   Special Requests   Final    AEROBIC BOTTLE ONLY Blood Culture results may not be optimal due to an inadequate volume of blood received in culture bottles   Culture   Final    NO GROWTH < 12 HOURS Performed at [facility], [address], [place], [place] [address]    Report Status PENDING  Incomplete  Blood Culture (routine x 2)     Status: None (Preliminary result)   Collection Time: 08/14/20 12:02 AM   Specimen: BLOOD RIGHT HAND  Result Value Ref Range Status   Specimen Description BLOOD RIGHT HAND  Final   Special Requests   Final    AEROBIC BOTTLE ONLY Blood Culture results may not be optimal due to an inadequate volume of blood received in culture bottles   Culture   Final    NO GROWTH < 12 HOURS Performed at [facility], [address] [place], [place] [address]    Report Status PENDING  Incomplete       Inpatient Medications:   Scheduled Meds: . insulin aspart  0-9 Units Subcutaneous TID WC   Continuous Infusions: . amiodarone 60 mg/hr (08/14/20 0906)   Followed by  . amiodarone    . ceFEPime (MAXIPIME) IV 2 g (08/14/20 1023)  . dextrose 5% lactated ringers Stopped (08/14/20 0856)  . lactated ringers Stopped (08/14/20 0425)  . levETIRAcetam Stopped (08/14/20 0950)  . metronidazole 500 mg (08/14/20 0910)  . potassium chloride    . propofol (DIPRIVAN) infusion 35 mcg/kg/min (08/14/20 1027)  . [START ON 08/15/2020] vancomycin  Radiological Exams on Admission: CT Head Wo Contrast  Result Date: 08/13/2020 CLINICAL DATA:  54 year old female found down, unresponsive. Reportedly had seizure activity, apnea. EXAM: CT HEAD WITHOUT CONTRAST TECHNIQUE: Contiguous axial  images were obtained from the base of the skull through the vertex without intravenous contrast. COMPARISON:  Brain MRI 06/24/2017.  Head CT 06/10/2018. FINDINGS: Brain: Stable cerebral volume. No midline shift, ventriculomegaly, mass effect, evidence of mass lesion, intracranial hemorrhage or evidence of cortically based acute infarction. Chronic lacunar infarct of the left thalamus. Stable patchy bilateral white matter hypodensity. No cortical encephalomalacia identified. Chronic cerebellar infarcts better demonstrated by MRI. Vascular: Calcified atherosclerosis at the skull base. No suspicious intracranial vascular hyperdensity. Skull: Chronic left lamina papyracea fracture. No acute osseous abnormality identified. Sinuses/Orbits: Visualized paranasal sinuses and mastoids are stable and well pneumatized. Other: No acute orbit or scalp soft tissue finding. Fluid in the visible pharynx related to intubation. IMPRESSION: 1. No acute intracranial abnormality identified. 2. Stable non contrast CT appearance of chronic small vessel disease since 2019. Electronically Signed   By: Genevie Ann M.D.   On: 08/13/2020 21:29   DG Chest Portable 1 View  Result Date: 08/13/2020 CLINICAL DATA:  Altered mental status following intubation EXAM: PORTABLE CHEST 1 VIEW COMPARISON:  06/09/2018 FINDINGS: Cardiac shadow is mildly enlarged but stable. Endotracheal tube is noted within the right mainstem bronchus and should be withdrawn 3-4 cm. Gastric catheter extends into the stomach although the tip is not visualized on this film. Lungs are clear bilaterally. No bony abnormality is seen. IMPRESSION: Endotracheal tube as described. This should be withdrawn 3-4 cm. Gastric catheter extends into the stomach. No other focal abnormality is noted. Critical Value/emergent results were called by telephone at the time of interpretation on 08/13/2020 at 9:14 pm to Dr. Nanda [name] , who verbally acknowledged these results. Electronically Signed    By: Inez Catalina M.D.   On: 08/13/2020 21:22   DG Chest Port 1V same Day  Result Date: 08/13/2020 CLINICAL DATA:  Endotracheal tube and enteric tube placements. EXAM: PORTABLE CHEST 1 VIEW COMPARISON:  08/13/2020 FINDINGS: Endotracheal tube with tip measuring 1.9 cm above the carina. Enteric tube tip is in the right upper quadrant consistent with location in the distal stomach. Shallow inspiration. Cardiac enlargement. Atelectasis or infiltration in the left lung base behind the heart. No pleural effusions. No pneumothorax. Mediastinal contours appear intact. IMPRESSION: Appliances appear in satisfactory location. Cardiac enlargement. Shallow inspiration with atelectasis or infiltration in the left lung base behind the heart. Electronically Signed   By: Lucienne Capers M.D.   On: 08/13/2020 22:14    Impression/Recommendations Active Problems:   Respiratory failure (HCC)   Status epilepticus -Likely related to hyperglycemia per teleneurology evaluation, but noted prior history of CVA -Prior history of seizures and noted to be on gabapentin -Head CT with cerebral atrophy and no other acute findings noted -Patient will need EEG monitoring and MRI with and without contrast which will be ordered -Continue on Keppra 1 g twice daily as recommended by neurology -Holding home gabapentin for now -Status post intubation for airway protection, pulmonology consulted for ventilator management  DKA in setting of type 2 diabetes-resolved -Hemoglobin A1c 8.3% on 04/2019 -Blood glucose now in the 150-200 range with acidosis resolved and anion gap closed -Patient now to SSI, which I will schedule every 4 hours -Decrease D5 LR to 75 mL/hour -May resume diet once extubated  Severe lactic acidosis-downtrending with possible severe sepsis -Continue monitor lactic acid levels which  have been downtrending, continue IV fluid and antibiotics as ordered -Covid testing negative -Urinalysis without any signs of  UTI -Chest x-ray with no signs of pneumonia -Blood cultures with no growth in the last 12 hours -Continue antibiotics with vancomycin, cefepime, and Flagyl as ordered until cultures return for now  Atrial fibrillation with RVR -Continue on amiodarone drip for rate control in setting of hypotension -Likely related to above factors -Plan to obtain repeat 2D echocardiogram as prior was 05/2017 with noted LVEF 30-35% and grade 2 diastolic dysfunction -Check TSH -Holding home Eliquis for now and maintaining on heparin drip  AKI on CKD stage IIIa -Baseline creatinine 1.3-1.4 -Current creatinine 1.66, will need continued monitoring -Continue IV fluid -Avoid nephrotoxic agents and check urine electrolytes  Hypokalemia -Repeat IV supplementation and monitor in a.m. along with magnesium  History of nonischemic cardiomyopathy with systolic and diastolic CHF -2D echocardiogram as noted above -Currently without any signs of volume overload -Holding home Lasix and beta-blocker  History of prior CVA -Brain MRI pending as noted above -Hold statin and Plavix for now  History of hypertension -Hold metoprolol and olmesartan as well as clonidine given soft blood pressure readings -Holding amlodipine  History of type 2 diabetes -On SSI as noted above, holding home oral agents  History of GERD -PPI IV daily   History of depression -Holding Cymbalta and trazodone Thank you for this consultation.  Our [facility] hospitalist team will follow the patient with you.   Discussed with brother Dwana Curd on phone.  Time Spent: 55 minutes  Manika Hast D Astryd Pearcy DO [facility] 08/14/2020, 10:38 AM

## 2020-08-14 NOTE — Progress Notes (Signed)
EEG completed, results pending. 

## 2020-08-14 NOTE — Progress Notes (Signed)
PCCM Progress Note  Arrived to [facility]. Transferred for presumed evaluation of seizure likely provoked by febrile illness and hyperglycemia in patient with reported h/o seizure disorder.  Appears stable. Vitals ok. Ventilator with minimal settings, breathing over set rate of 12. Does not arouse to sternal rub, Small amount of blood from nares and seen in OG tube to suction.  Plan: Continue vent Heparin drip for Afib, if epistaxis worsens d/c Stop amio, HR in 80s, IV metop for now (BP robust), resume oral in AM Keppra for seizure, will reduce sedatives in morning in order to try and get exam, if non-focal MRI unlikely to be helpful Cont broad spectrum abx for fever, UA clean, CXR largely clear, blood cult pending  CRITICAL CARE Performed by: Lanier Clam   Total critical care time: 30 minutes  Critical care time was exclusive of separately billable procedures and treating other patients.  Critical care was necessary to treat or prevent imminent or life-threatening deterioration.  Critical care was time spent personally by me on the following activities: development of treatment plan with patient and/or surrogate as well as nursing, discussions with consultants, evaluation of patient's response to treatment, examination of patient, obtaining history from patient or surrogate, ordering and performing treatments and interventions, ordering and review of laboratory studies, ordering and review of radiographic studies, pulse oximetry and re-evaluation of patient's condition.

## 2020-08-14 NOTE — Progress Notes (Signed)
Pharmacy Antibiotic Note  [name] is a 54 y.o. female admitted on 08/13/2020 with sepsis.  Pharmacy has been consulted for Vancomycin dosing.  Plan: Vancomycin 1500mg  loading dose given, 1250mg  IV every 24 hours.  Goal trough 15-20 mcg/mL.  Cefepime 2gm IV q12h F/U cxs and clinical progress Monitor V/S, labs and levels as indicated   Temp (24hrs), Avg:101.2 F (38.4 C), Min:98.6 F (37 C), Max:102.2 F (39 C)  Recent Labs  Lab 08/13/20 2044 08/14/20 0002 08/14/20 0253 08/14/20 0618  WBC 10.9*  --   --   --   CREATININE 1.88*  --  1.69* 1.66*  LATICACIDVEN 10.1* 5.2*  --   --     Estimated Creatinine Clearance: 36.9 mL/min (A) (by C-G formula based on SCr of 1.66 mg/dL (H)).    No Known Allergies  Antimicrobials this admission: Cefepime 10/20 >>  Vancomycin 10/20 >>  Flagyl 10/20>> Dose adjustments this admission: vanc/cefepime  Microbiology results: 10/19 BCx: pending  MRSA PCR:   Thank you for allowing pharmacy to be a part of this patient's care.  Isac Sarna, BS Pharm D, [place] Clinical Pharmacist Pager [phone] 08/14/2020 8:53 AM

## 2020-08-14 NOTE — Progress Notes (Signed)
ANTICOAGULATION CONSULT NOTE - Initial Consult  Pharmacy Consult for Heparin Indication: atrial fibrillation  No Known Allergies  Patient Measurements: Height: 5\' 1"  (154.9 cm) Weight: 82.6 kg (182 lb 1.6 oz) IBW/kg (Calculated) : 47.8 HEPARIN DW (KG): 66.6  Vital Signs: Temp: 99.3 F (37.4 C) (10/20 1300) Temp Source: Core (10/20 1204) BP: 103/88 (10/20 1300) Pulse Rate: 89 (10/20 1204)  Labs: Recent Labs    08/13/20 2044 08/13/20 2221 08/14/20 0253 08/14/20 0618  HGB 15.0  --   --   --   HCT 47.1*  --   --   --   PLT 241  --   --   --   APTT <20*  --   --   --   LABPROT 12.3  --   --   --   INR 1.0  --   --   --   CREATININE 1.88*  --  1.69* 1.66*  TROPONINIHS 41* 39*  --   --     Estimated Creatinine Clearance: 36.9 mL/min (A) (by C-G formula based on SCr of 1.66 mg/dL (H)).   Medical History: Past Medical History:  Diagnosis Date  . Alcohol use   . Ankle fracture, lateral malleolus, closed 12/30/2011  . Anxiety   . Breast mass, left 12/15/2011  . Chronic anemia   . Chronic combined systolic and diastolic CHF (congestive heart failure) ([place])   . CKD (chronic kidney disease), stage III ([place])    pt denies  . Cocaine abuse ([place])   . COPD (chronic obstructive pulmonary disease) ([place])   . Diabetes mellitus, type 2 ([place])   . Diabetic Charcot foot ([place]) 12/30/2011  . Essential hypertension   . History of GI bleed 12/05/2009   Qualifier: Diagnosis of  By: Zeb Comfort    . Hyperlipidemia   . Hypertensive cardiomyopathy ([place]) 11/08/2012   a. 05/2016: echo showing EF of 20-25% with normal cors by cath in 07/2016.  . Morbid obesity ([place]) 01/31/2008   Qualifier: Diagnosis of  By: Lenn Cal    . Noncompliance with medication regimen   . Obesity   . Panic attacks   . PAT (paroxysmal atrial tachycardia) ([place])    a. started on amiodarone 07/2017 for this.  . Sleep apnea    does not use cpap  . Stroke ([place])    06/24/17  . Tobacco abuse   . Urinary incontinence      Medications:  See med rec  Assessment: Patient was brought to the ED after she was found unresponsive by her boyfriend. She was intubated. Patient with DKA and elevated temperature. Empiric antibiotics started. Patient has history of afib and supposedly on eliquis for anticoagulation but patient unable to tell usher last dose. Baseline anti Xa level is 1.0, and APTT <20. Unsure is patient compliant. Will not load and follow both APTT and anti-Xa levels until they correlate. Most likely last dose was yesterday, so will initiate heparin now.   Goal of Therapy:  Heparin level 0.3-0.7 units/ml aPTT 66-102 seconds Monitor platelets by anticoagulation protocol: Yes   Plan:  Start heparin infusion at 1000 units/hr Check APTT and anti-Xa level in ~6 hours hours and daily while on heparin Continue to monitor H&H and platelets  Isac Sarna, BS [place], BCPS Clinical Pharmacist Pager [phone] 08/14/2020,1:36 PM

## 2020-08-14 NOTE — Procedures (Signed)
Patient Name: [name]  MRN: [id number]  Epilepsy Attending: Lora Havens  Referring Physician/Provider: Dr. Heath Lark Date: 08/14/2020 Duration: 25.01 minutes  Patient history: 54 year old female with history of epilepsy who had a breakthrough seizure, in the setting of DKA with blood glucose in the 700 range.  EEG to evaluate for seizures.  Level of alertness: comatose  AEDs during EEG study: Keppra, propofol  Technical aspects: This EEG study was done with scalp electrodes positioned according to the 10-20 International system of electrode placement. Electrical activity was acquired at a sampling rate of 500Hz  and reviewed with a high frequency filter of 70Hz  and a low frequency filter of 1Hz . EEG data were recorded continuously and digitally stored.   Description: EEG showed continuous generalized 3 to 5 Hz theta-delta slowing with frontocentral 13 to 15 Hz beta activity.  Hyperventilation and photic stimulation were not performed.     ABNORMALITY -Continuous slow, generalized  IMPRESSION: This study is suggestive of severe diffuse encephalopathy, nonspecific etiology but likely related to sedation. No seizures or epileptiform discharges were seen throughout the recording.  Quinisha Mould Barbra Sarks

## 2020-08-14 NOTE — Progress Notes (Signed)
ANTICOAGULATION CONSULT NOTE - Initial Consult  Pharmacy Consult for Heparin Indication: atrial fibrillation  No Known Allergies  Patient Measurements: Height: 5\' 1"  (154.9 cm) Weight: 82.6 kg (182 lb 1.6 oz) IBW/kg (Calculated) : 47.8 HEPARIN DW (KG): 66.6  Vital Signs: Temp: 99.1 F (37.3 C) (10/20 1330) Temp Source: Core (10/20 1204) BP: 98/84 (10/20 1330) Pulse Rate: 89 (10/20 1204)  Labs: Recent Labs    08/13/20 2044 08/13/20 2221 08/14/20 0253 08/14/20 0618 08/14/20 1223  HGB 15.0  --   --   --   --   HCT 47.1*  --   --   --   --   PLT 241  --   --   --   --   APTT <20*  --   --   --  25  LABPROT 12.3  --   --   --   --   INR 1.0  --   --   --   --   HEPARINUNFRC  --   --   --   --  <0.10*  CREATININE 1.88*  --  1.69* 1.66*  --   TROPONINIHS 41* 39*  --   --   --     Estimated Creatinine Clearance: 36.9 mL/min (A) (by C-G formula based on SCr of 1.66 mg/dL (H)).   Medical History: Past Medical History:  Diagnosis Date  . Alcohol use   . Ankle fracture, lateral malleolus, closed 12/30/2011  . Anxiety   . Breast mass, left 12/15/2011  . Chronic anemia   . Chronic combined systolic and diastolic CHF (congestive heart failure) ([place])   . CKD (chronic kidney disease), stage III ([place])    pt denies  . Cocaine abuse ([place])   . COPD (chronic obstructive pulmonary disease) ([place])   . Diabetes mellitus, type 2 ([place])   . Diabetic Charcot foot ([place]) 12/30/2011  . Essential hypertension   . History of GI bleed 12/05/2009   Qualifier: Diagnosis of  By: Zeb Comfort    . Hyperlipidemia   . Hypertensive cardiomyopathy ([place]) 11/08/2012   a. 05/2016: echo showing EF of 20-25% with normal cors by cath in 07/2016.  . Morbid obesity ([place]) 01/31/2008   Qualifier: Diagnosis of  By: Lenn Cal    . Noncompliance with medication regimen   . Obesity   . Panic attacks   . PAT (paroxysmal atrial tachycardia) ([place])    a. started on amiodarone 07/2017 for this.  . Sleep apnea     does not use cpap  . Stroke ([place])    06/24/17  . Tobacco abuse   . Urinary incontinence     Medications:  See med rec  Assessment: Patient was brought to the ED after she was found unresponsive by her boyfriend. She was intubated. Patient with DKA and elevated temperature. Empiric antibiotics started. Patient has history of afib and supposedly on eliquis for anticoagulation but patient unable to tell usher last dose. Refill records from pharmacy show that last fill was in 2020.  Baseline anti Xa level is <0.1, and APTT <20. Unsure if patient compliant. will initiate heparin now.  May stop APTT levels if next set of levels show that patient was non-compliant and may consider bolus if subtherapeutic.  Goal of Therapy:  Heparin level 0.3-0.7 units/ml aPTT 66-102 seconds Monitor platelets by anticoagulation protocol: Yes   Plan:  Start heparin infusion at 1000 units/hr Check APTT and anti-Xa level in ~6 hours hours and daily while on heparin.  Continue to monitor H&H and platelets  Isac Sarna, BS Vena [place], BCPS Clinical Pharmacist Pager [phone] 08/14/2020,1:59 PM

## 2020-08-14 NOTE — ED Notes (Signed)
[facility] transporting patient to [facility].

## 2020-08-14 NOTE — Progress Notes (Signed)
Patient received from [facility] at this time.

## 2020-08-14 NOTE — Progress Notes (Signed)
ANTICOAGULATION CONSULT NOTE -  Pharmacy Consult for Heparin Indication: atrial fibrillation  No Known Allergies  Patient Measurements: Height: 5\' 1"  (154.9 cm) Weight: 82.6 kg (182 lb 1.6 oz) IBW/kg (Calculated) : 47.8 HEPARIN DW (KG): 66.6  Vital Signs: Temp: 99 F (37.2 C) (10/20 2200) Temp Source: Core (10/20 1204) BP: 145/94 (10/20 2200) Pulse Rate: 70 (10/20 2200)  Labs: Recent Labs    08/13/20 2044 08/13/20 2221 08/14/20 0253 08/14/20 0618 08/14/20 1223 08/14/20 2137  HGB 15.0  --   --   --   --   --   HCT 47.1*  --   --   --   --   --   PLT 241  --   --   --   --   --   APTT <20*  --   --   --  25 63*  LABPROT 12.3  --   --   --   --   --   INR 1.0  --   --   --   --   --   HEPARINUNFRC  --   --   --   --  <0.10* 0.38  CREATININE 1.88*  --  1.69* 1.66*  --   --   TROPONINIHS 41* 39*  --   --   --   --     Estimated Creatinine Clearance: 36.9 mL/min (A) (by C-G formula based on SCr of 1.66 mg/dL (H)).   Medical History: Past Medical History:  Diagnosis Date  . Alcohol use   . Ankle fracture, lateral malleolus, closed 12/30/2011  . Anxiety   . Breast mass, left 12/15/2011  . Chronic anemia   . Chronic combined systolic and diastolic CHF (congestive heart failure) ([name])   . CKD (chronic kidney disease), stage III ([place])    pt denies  . Cocaine abuse ([place])   . COPD (chronic obstructive pulmonary disease) ([place])   . Diabetes mellitus, type 2 ([place])   . Diabetic Charcot foot ([place]) 12/30/2011  . Essential hypertension   . History of GI bleed 12/05/2009   Qualifier: Diagnosis of  By: Zeb Comfort    . Hyperlipidemia   . Hypertensive cardiomyopathy ([place]) 11/08/2012   a. 05/2016: echo showing EF of 20-25% with normal cors by cath in 07/2016.  . Morbid obesity ([place]) 01/31/2008   Qualifier: Diagnosis of  By: Lenn Cal    . Noncompliance with medication regimen   . Obesity   . Panic attacks   . PAT (paroxysmal atrial tachycardia) ([place])    a. started on  amiodarone 07/2017 for this.  . Sleep apnea    does not use cpap  . Stroke ([place])    06/24/17  . Tobacco abuse   . Urinary incontinence     Medications:  See med rec  Assessment: Patient was brought to the ED after she was found unresponsive by her boyfriend. She was intubated. Patient with DKA and elevated temperature. Empiric antibiotics started. Patient has history of afib and supposedly on eliquis for anticoagulation but patient unable to tell usher last dose. Refill records from pharmacy show that last fill was in 2020.  Baseline anti Xa level is <0.1, and APTT <20. Unsure if patient compliant. will initiate heparin now.  May stop APTT levels if next set of levels show that patient was non-compliant and may consider bolus if subtherapeutic.  08/14/2020 2137 HL 0.38 therapeutic APTT 63  Per MD note: Small amount of blood from nares and seen  in OG tube to suction.  Goal of Therapy:  Heparin level 0.3-0.7 units/ml aPTT 66-102 seconds Monitor platelets by anticoagulation protocol: Yes   Plan:  continue heparin infusion at 1000 units/hr Will use only heparin levels going forward Check anti-Xa level with am labs Continue to monitor H&H and platelets  Dolly Rias RPh 08/14/2020, 10:38 PM

## 2020-08-15 ENCOUNTER — Inpatient Hospital Stay (HOSPITAL_COMMUNITY): Payer: Medicare Other

## 2020-08-15 DIAGNOSIS — I4891 Unspecified atrial fibrillation: Secondary | ICD-10-CM | POA: Diagnosis not present

## 2020-08-15 DIAGNOSIS — J9600 Acute respiratory failure, unspecified whether with hypoxia or hypercapnia: Secondary | ICD-10-CM | POA: Diagnosis not present

## 2020-08-15 DIAGNOSIS — G0490 Encephalitis and encephalomyelitis, unspecified: Secondary | ICD-10-CM

## 2020-08-15 DIAGNOSIS — E872 Acidosis: Secondary | ICD-10-CM | POA: Diagnosis not present

## 2020-08-15 DIAGNOSIS — G40409 Other generalized epilepsy and epileptic syndromes, not intractable, without status epilepticus: Secondary | ICD-10-CM | POA: Diagnosis not present

## 2020-08-15 DIAGNOSIS — J9601 Acute respiratory failure with hypoxia: Secondary | ICD-10-CM

## 2020-08-15 DIAGNOSIS — N179 Acute kidney failure, unspecified: Secondary | ICD-10-CM

## 2020-08-15 LAB — BASIC METABOLIC PANEL
Anion gap: 11 (ref 5–15)
BUN: 17 mg/dL (ref 6–20)
CO2: 14 mmol/L — ABNORMAL LOW (ref 22–32)
Calcium: 7.9 mg/dL — ABNORMAL LOW (ref 8.9–10.3)
Chloride: 115 mmol/L — ABNORMAL HIGH (ref 98–111)
Creatinine, Ser: 1.93 mg/dL — ABNORMAL HIGH (ref 0.44–1.00)
GFR, Estimated: 28 mL/min — ABNORMAL LOW (ref >60)
Glucose, Bld: 93 mg/dL (ref 70–99)
Potassium: 5.7 mmol/L — ABNORMAL HIGH (ref 3.5–5.1)
Sodium: 140 mmol/L (ref 135–145)

## 2020-08-15 LAB — CBC
HCT: 43 % (ref 36.0–46.0)
Hemoglobin: 13.8 g/dL (ref 12.0–15.0)
MCH: 29.7 pg (ref 26.0–34.0)
MCHC: 32.1 g/dL (ref 30.0–36.0)
MCV: 92.5 fL (ref 80.0–100.0)
Platelets: 174 10*3/uL (ref 150–400)
RBC: 4.65 MIL/uL (ref 3.87–5.11)
RDW: 14.1 % (ref 11.5–15.5)
WBC: 8.5 10*3/uL (ref 4.0–10.5)
nRBC: 0 % (ref 0.0–0.2)

## 2020-08-15 LAB — CREATININE, SERUM
Creatinine, Ser: 1.82 mg/dL — ABNORMAL HIGH (ref 0.44–1.00)
GFR, Estimated: 32 mL/min — ABNORMAL LOW (ref >60)

## 2020-08-15 LAB — GLUCOSE, CAPILLARY
Glucose-Capillary: 83 mg/dL (ref 70–99)
Glucose-Capillary: 91 mg/dL (ref 70–99)
Glucose-Capillary: 101 mg/dL — ABNORMAL HIGH (ref 70–99)
Glucose-Capillary: 89 mg/dL (ref 70–99)
Glucose-Capillary: 149 mg/dL — ABNORMAL HIGH (ref 70–99)
Glucose-Capillary: 153 mg/dL — ABNORMAL HIGH (ref 70–99)

## 2020-08-15 LAB — CSF CELL COUNT WITH DIFFERENTIAL
RBC Count, CSF: 8 /mm3 — ABNORMAL HIGH
Tube #: 4
WBC, CSF: 10 /mm3 — ABNORMAL HIGH (ref 0–5)

## 2020-08-15 LAB — HEMOGLOBIN A1C
Hgb A1c MFr Bld: 13.8 % — ABNORMAL HIGH (ref 4.8–5.6)
Mean Plasma Glucose: 349 mg/dL

## 2020-08-15 LAB — GLUCOSE, CSF: Glucose, CSF: 64 mg/dL (ref 40–70)

## 2020-08-15 LAB — HEPARIN LEVEL (UNFRACTIONATED): Heparin Unfractionated: <0.1 IU/mL — ABNORMAL LOW (ref 0.30–0.70)

## 2020-08-15 LAB — PROTEIN, CSF: Total  Protein, CSF: 86 mg/dL — ABNORMAL HIGH (ref 15–45)

## 2020-08-15 MED ORDER — MIDAZOLAM HCL 2 MG/2ML IJ SOLN
1.0000 mg | Freq: Once | INTRAMUSCULAR | Status: AC
  Administered 2020-08-15: 1 mg via INTRAVENOUS
  Filled 2020-08-15: qty 2

## 2020-08-15 MED ORDER — METOPROLOL TARTRATE 25 MG/10 ML ORAL SUSPENSION
12.5000 mg | Freq: Four times a day (QID) | ORAL | Status: DC
  Administered 2020-08-15 – 2020-08-17 (×9): 12.5 mg
  Filled 2020-08-15 (×12): qty 5

## 2020-08-15 MED ORDER — GADOBUTROL 1 MMOL/ML IV SOLN
8.0000 mL | Freq: Once | INTRAVENOUS | Status: AC | PRN
  Administered 2020-08-15: 8 mL via INTRAVENOUS

## 2020-08-15 MED ORDER — HEPARIN SODIUM (PORCINE) 5000 UNIT/ML IJ SOLN
5000.0000 [IU] | Freq: Three times a day (TID) | INTRAMUSCULAR | Status: AC
  Administered 2020-08-15 – 2020-08-16 (×3): 5000 [IU] via SUBCUTANEOUS
  Filled 2020-08-15 (×3): qty 1

## 2020-08-15 MED ORDER — FENTANYL CITRATE (PF) 100 MCG/2ML IJ SOLN
100.0000 ug | Freq: Once | INTRAMUSCULAR | Status: DC
  Filled 2020-08-15: qty 2

## 2020-08-15 MED ORDER — CHLORHEXIDINE GLUCONATE 0.12 % MT SOLN
OROMUCOSAL | Status: AC
  Filled 2020-08-15: qty 15

## 2020-08-15 MED ORDER — SODIUM CHLORIDE 0.9 % IV SOLN
2.0000 g | Freq: Two times a day (BID) | INTRAVENOUS | Status: DC
  Administered 2020-08-15 – 2020-08-16 (×3): 2 g via INTRAVENOUS
  Filled 2020-08-15: qty 2
  Filled 2020-08-15 (×2): qty 20

## 2020-08-15 MED ORDER — DEXTROSE 5 % IV SOLN
600.0000 mg | Freq: Two times a day (BID) | INTRAVENOUS | Status: DC
  Administered 2020-08-15 – 2020-08-18 (×7): 600 mg via INTRAVENOUS
  Filled 2020-08-15 (×8): qty 12

## 2020-08-15 MED ORDER — FENTANYL CITRATE (PF) 100 MCG/2ML IJ SOLN
50.0000 ug | Freq: Once | INTRAMUSCULAR | Status: AC
  Administered 2020-08-15: 50 ug via INTRAVENOUS
  Filled 2020-08-15: qty 2

## 2020-08-15 MED ORDER — SODIUM CHLORIDE 0.9 % IV BOLUS
500.0000 mL | Freq: Once | INTRAVENOUS | Status: AC
  Administered 2020-08-15: 500 mL via INTRAVENOUS

## 2020-08-15 MED ORDER — METOPROLOL TARTRATE 12.5 MG HALF TABLET
12.5000 mg | ORAL_TABLET | Freq: Four times a day (QID) | ORAL | Status: DC
  Administered 2020-08-15: 12.5 mg via ORAL
  Filled 2020-08-15: qty 1

## 2020-08-15 MED ORDER — SODIUM CHLORIDE 0.9 % IV SOLN
750.0000 mg | Freq: Two times a day (BID) | INTRAVENOUS | Status: DC
  Administered 2020-08-15 – 2020-08-16 (×2): 750 mg via INTRAVENOUS
  Filled 2020-08-15 (×2): qty 7.5

## 2020-08-15 MED ORDER — SODIUM ZIRCONIUM CYCLOSILICATE 10 G PO PACK
10.0000 g | PACK | Freq: Three times a day (TID) | ORAL | Status: DC
  Administered 2020-08-15 (×2): 10 g via ORAL
  Filled 2020-08-15 (×2): qty 1

## 2020-08-15 MED ORDER — SODIUM ZIRCONIUM CYCLOSILICATE 10 G PO PACK
10.0000 g | PACK | Freq: Three times a day (TID) | ORAL | Status: DC
  Administered 2020-08-15: 10 g
  Filled 2020-08-15: qty 1

## 2020-08-15 MED ORDER — SODIUM CHLORIDE 0.9 % IV SOLN
2.0000 g | Freq: Four times a day (QID) | INTRAVENOUS | Status: DC
  Administered 2020-08-15 – 2020-08-16 (×5): 2 g via INTRAVENOUS
  Filled 2020-08-15: qty 2000
  Filled 2020-08-15 (×3): qty 2
  Filled 2020-08-15: qty 2000

## 2020-08-15 NOTE — Procedures (Signed)
Lumbar Puncture Procedure Note  [name]  [id number]  1966/08/26  Date:08/15/20  Time:5:15 PM   Provider Performing:Trevyn Lumpkin Posey Pronto   Procedure: Fluoroscopic Guided Lumbar Puncture   Indication(s) Rule out meningitis  Consent  Obtained by phone from brother.   Anesthesia Topical only with 1% lidocaine    Time Out Verified patient identification, verified procedure, site/side was marked, verified correct patient position, special equipment/implants available, medications/allergies/relevant history reviewed, required imaging and test results available.   Sterile Technique Maximal sterile technique including sterile barrier drape, hand hygiene, sterile gown, sterile gloves, mask, hair covering.    Procedure Description Using palpation, approximate location of L3-L4 space identified.   Lidocaine used to anesthetize skin and subcutaneous tissue overlying this area.  A 20g spinal needle was then used to access the subarachnoid space. Opening pressure: Normal. 9 mL clear CSF obtained.  Complications/Tolerance None; patient tolerated the procedure well.   EBL Minimal   Specimen(s) CSF

## 2020-08-15 NOTE — Consult Note (Signed)
NAME:  TEONNA COONAN, MRN:  [id number], DOB:  02/01/1966, LOS: 2 ADMISSION DATE:  08/13/2020, CONSULTATION DATE:  08/14/20 REFERRING MD:  edp, CHIEF COMPLAINT:  Acute resp    Brief History   54 yobf smoker with HBP no known sz disorder to ER Pm 10/20 with ams then sz with w/u pos for AG met acidosis/ fever to 102 ? Source and rec to trx to [facility] when bed available / PCCM service at [facility] asked to consult awaiting trx.   History of present illness    54 y.o. female   presented to the emergency department 8:30 pm  after being found unresponsive by her boyfriend.  He last saw her well earlier in the evening of 10/19.  He states he went to get something to eat and then came back and found her unresponsive.  EMS arrived to find the patient with seizure activity which was intermittent.  They established IV access and transported her immediately to the emergency department.  She had nonrebreather on arrival.  She would awaken to pain but not provide any verbal response.  Unknown history of seizure.  Boyfriend and family are unsure of the patient's exact medical history. Pt started gm sz on arrival and was intubated and placed on diprovan drip with labs c/w Met acidosis ? dka > CT head neg and arranged for tx to [facility] pm 10/20 with no furhter sz and temp to 102 s source identified.  Past Medical History   Alcohol use  Anxiety Chronic anemia Chronic combined systolic and diastolic CHF (congestive heart failure) (HCC)  CKD (chronic kidney disease), stage III (HCC)  Cocaine abuse (HCC) COPD (chronic obstructive pulmonary disease) ([place]) Diabetes mellitus, type 2 ([place])  Diabetic Charcot foot ([place])  Essential hypertension  Morbid obesity ([place]) (01/31/2008)  Noncompliance with medication regimen PAT (paroxysmal atrial tachycardia) (HCC) Sleep apnea Stroke ([place]), Tobacco abuse Urinary incontinence.    Significant Hospital Events   Afib 10/19 pm > amiodarone rx   Consults:  Neurology  10/19 PCCM         10/20   Procedures:    Significant Diagnostic Tests:  CT head 10/19  Neg UDS   10/19  Pos cocaine, etoh  < 10    Micro Data:  Covid 19  PCR   10/19   Neg Flu  A/B   PCR    10/19   Neg  BC x 2    10/19 neg  U/a  Neg leuk/nitries    Antimicrobials:  Cefepime  10/19>>> Flagyl  10/19 >>>10/21 Vanc    10/19 >>10/21 Ampicillin 10/21>> Acyclovir 10/21>>   Scheduled Meds: . chlorhexidine gluconate (MEDLINE KIT)  15 mL Mouth Rinse BID  . Chlorhexidine Gluconate Cloth  6 each Topical Daily  . heparin  5,000 Units Subcutaneous Q8H  . insulin aspart  0-15 Units Subcutaneous Q4H  . mouth rinse  15 mL Mouth Rinse 10 times per day  . metoprolol tartrate  12.5 mg Per Tube Q6H  . pantoprazole (PROTONIX) IV  40 mg Intravenous Q24H  . sodium zirconium cyclosilicate  10 g Oral TID  . thiamine injection  100 mg Intravenous Daily   Continuous Infusions: . acyclovir Stopped (08/15/20 8502)  . ampicillin (OMNIPEN) IV Stopped (08/15/20 1320)  . cefTRIAXone (ROCEPHIN)  IV Stopped (08/15/20 0710)  . lactated ringers 125 mL/hr at 08/15/20 1444  . levETIRAcetam    . propofol (DIPRIVAN) infusion 50 mcg/kg/min (08/15/20 1444)   PRN Meds:.dextrose   Interim history/subjective:  Sedated on vent / no further sz /   Objective   Blood pressure (!) 159/125, pulse 80, temperature 98.8 F (37.1 C), resp. rate 18, height _0  (1.549 m), weight 82.6 kg, last menstrual period 05/19/2016, SpO2 100 %.    Vent Mode: PRVC FiO2 (%):  [30 %] 30 % Set Rate:  [12 bmp-16 bmp] 12 bmp Vt Set:  [380 mL] 380 mL PEEP:  [5 cmH20] 5 cmH20 Pressure Support:  [10 cmH20] 10 cmH20 Plateau Pressure:  [14 cmH20-15 cmH20] 14 cmH20   Intake/Output Summary (Last 24 hours) at 08/15/2020 1456 Last data filed at 08/15/2020 1444 Gross per 24 hour  Intake 3597.93 ml  Output 2875 ml  Net 722.93 ml   Filed Weights   08/14/20 1300  Weight: 82.6 kg    Examination: Tmax 102.2  General: sedated on vent / chemosis L  >R  HENT: ET in place Lungs: clear to A and P Cardiovascular: RRR no S3 Abdomen: obese soft Extremities: warm s edema Neuro: sedated, no nystagmus   Labs and Studies personally reviewed MRI with R acute/subacute infarct in basal ganglia, old strokes likely due to HTN     Resolved Hospital Problem list      Assessment & Plan:  Acute resp failure in setting of new onset sz/ mild dka - minimal vent settings - VAP protocol - Wean sedation after diagnostic studies complete - prop gtt, fentanyl PRN  New onset SZ  Assoc with hyperglycemia, acute R basal ganglia stroke  - keppra - BG control  Toxic Metabolic encephalopathy: Multifactorial and related to stroke, seizure, hyperglycemia. -Vent as above -On abx for meningitis, felt less likely, awaiting LP  Atrial fibrillation  - NSR on metop - hep gtt on pause for LP  Lactic acidosis: Due to seizure, AG better.   AKI: Cr elevated, in setting of critical illness. Suspect hypovolemia as not getting TFs. Possible progression of CKD in setting of drug use, HTN.  --CTM --TFs after returns from LP   Best practice:  Diet: npo, TFs this evening Pain/Anxiety/Delirium protocol (if indicated): prop,  VAP protocol (if indicated): per protocol DVT prophylaxis: Hep IV  GI prophylaxis: ppi Glucose control: SSI Mobility: sbr Code Status: full code Family Communication: will attempt to update Disposition: ICU  Labs   CBC: Recent Labs  Lab 08/13/20 2044 08/15/20 0647  WBC 10.9* 8.5  NEUTROABS 8.0*  --   HGB 15.0 13.8  HCT 47.1* 43.0  MCV 92.5 92.5  PLT 241 638    Basic Metabolic Panel: Recent Labs  Lab 08/13/20 2044 08/14/20 0253 08/14/20 0618 08/15/20 0320 08/15/20 0647  NA 135 140 141 140  --   K 2.6* 3.1* 3.0* 5.7*  --   CL 94* 105 104 115*  --   CO2 20* 24 23 14*  --   GLUCOSE 772* 392* 233* 93  --   BUN _1 --   CREATININE 1.88* 1.69* 1.66* 1.93* 1.82*  CALCIUM 8.9 8.3* 8.4* 7.9*  --   MG  --   --   1.7  --   --    GFR: Estimated Creatinine Clearance: 33.6 mL/min (A) (by C-G formula based on SCr of 1.82 mg/dL (H)). Recent Labs  Lab 08/13/20 2044 08/14/20 0002 08/14/20 0940 08/14/20 1223 08/15/20 0647  PROCALCITON  --   --   --  0.15  --   WBC 10.9*  --   --   --  8.5  LATICACIDVEN 10.1* 5.2*  3.0* 2.5*  --     Liver Function Tests: Recent Labs  Lab 08/13/20 2044  AST 19  ALT 16  ALKPHOS 112  BILITOT 0.9  PROT 6.9  ALBUMIN 3.1*   No results for input(s): LIPASE, AMYLASE in the last 168 hours. No results for input(s): AMMONIA in the last 168 hours.  ABG    Component Value Date/Time   PHART 7.348 (L) 08/13/2020 2256   PCO2ART 46.0 08/13/2020 2256   PO2ART 205 (H) 08/13/2020 2256   HCO3 23.8 08/13/2020 2256   TCO2 17 09/11/2014 1152   ACIDBASEDEF 0.3 08/13/2020 2256   O2SAT 99.9 08/13/2020 2256     Coagulation Profile: Recent Labs  Lab 08/13/20 2044  INR 1.0    Cardiac Enzymes: No results for input(s): CKTOTAL, CKMB, CKMBINDEX, TROPONINI in the last 168 hours.  HbA1C: Hgb A1c MFr Bld  Date/Time Value Ref Range Status  08/14/2020 12:23 PM 13.8 (H) 4.8 - 5.6 % Final    Comment:    (NOTE)         Prediabetes: 5.7 - 6.4         Diabetes: >6.4         Glycemic control for adults with diabetes: <7.0   05/09/2019 09:56 AM 8.3 (H) 4.8 - 5.6 % Final    Comment:    (NOTE) Pre diabetes:          5.7%-6.4% Diabetes:              >6.4% Glycemic control for   <7.0% adults with diabetes     CBG: Recent Labs  Lab 08/14/20 2003 08/14/20 2314 08/15/20 0318 08/15/20 0805 08/15/20 1206  GLUCAP 121* 109* 83 91 101*      Surgical History    Past Surgical History:  Procedure Laterality Date  . BREAST BIOPSY    . CARDIAC CATHETERIZATION N/A 07/28/2016   Procedure: Left Heart Cath and Coronary Angiography;  Surgeon: Jettie Booze, MD;  Location: [place] [facility];  Service: Cardiovascular;  Laterality: N/A;  . COLONOSCOPY N/A 05/10/2019    Procedure: COLONOSCOPY;  Surgeon: Danie Binder, MD;  [facility];  Service: Endoscopy;  Laterality: N/A;  Phenergan 12.5 mg IV in pre-op  . DILATION AND CURETTAGE OF UTERUS    . I & D EXTREMITY Bilateral 09/22/2017   Procedure: BILATERAL DEBRIDEMENT LEG/FOOT ULCERS, APPLY VERAFLO WOUND VAC;  Surgeon: Newt Minion, MD;  Location: [place];  Service: Orthopedics;  Laterality: Bilateral;  . I & D EXTREMITY Right 10/11/2018   Procedure: IRRIGATION AND DEBRIDEMENT RIGHT HAND;  Surgeon: [name], MD;  Location: [place];  Service: Orthopedics;  Laterality: Right;  . I & D EXTREMITY Right 10/13/2018   Procedure: REPEAT IRRIGATION AND DEBRIDEMENT RIGHT HAND;  Surgeon: [name], MD;  Location: [place];  Service: Orthopedics;  Laterality: Right;  . I & D EXTREMITY Right 11/22/2018   Procedure: IRRIGATION AND DEBRIDEMENT AND PINNING RIGHT HAND;  Surgeon: [name], MD;  Location: [place];  Service: Orthopedics;  Laterality: Right;  . IR RADIOLOGIST EVAL & MGMT  07/05/2018  . POLYPECTOMY  05/10/2019   Procedure: POLYPECTOMY;  Surgeon: Danie Binder, MD;  [facility];  Service: Endoscopy;;  . SKIN SPLIT GRAFT Bilateral 09/28/2017   Procedure: BILATERAL SPLIT THICKNESS SKIN GRAFT LEGS/FEET AND APPLY VAC;  Surgeon: Newt Minion, MD;  Location: [place];  Service: Orthopedics;  Laterality: Bilateral;  . SKIN SPLIT GRAFT Right 11/22/2018   Procedure:  SKIN GRAFT SPLIT THICKNESS;  Surgeon: [name], MD;  Location: [place];  Service: Orthopedics;  Laterality: Right;     Social History   reports that she has been smoking cigarettes. She has a 20.00 pack-year smoking history. She has never used smokeless tobacco. She reports previous drug use. Frequency: 3.00 times per week. Drugs: Marijuana and Cocaine. She reports that she does not drink alcohol.   Family History   Her family history includes Arthritis in an other family member; Asthma in an other family member; Cancer in  her sister and another family member; Diabetes in an other family member; Heart attack in her mother; Hypertension in her brother, daughter, father, mother, sister, sister, and son.   Allergies No Known Allergies   Home Medications  Prior to Admission medications   Medication Sig Start Date End Date Taking? Authorizing Provider  albuterol (PROAIR HFA) 108 (90 Base) MCG/ACT inhaler INHALE 2 PUFFS EVERY 6 HOURS AS NEEDED FOR SHORTNESS OF BREATH/WHEEZING. Patient taking differently: Inhale 1-2 puffs into the lungs every 6 (six) hours as needed for shortness of breath.  08/05/20  Yes [name], MD  amLODipine (NORVASC) 10 MG tablet TAKE 1 TABLET BY MOUTH ONCE A DAY. Patient taking differently: Take 10 mg by mouth daily.  07/19/20  Yes [name], MD  cloNIDine (CATAPRES) 0.3 MG tablet TAKE 1 TABLET BY MOUTH AT BEDTIME FOR BLOOD PRESSURE Patient taking differently: Take 0.3 mg by mouth at bedtime.  07/19/20  Yes [name], MD  clopidogrel (PLAVIX) 75 MG tablet TAKE 1 TABLET BY MOUTH DAILY WITH BREAKFAST. Patient taking differently: Take 75 mg by mouth daily.  07/19/20  Yes [name], MD  Dextromethorphan-guaiFENesin (DELSYM COUGH/CHEST CONGEST DM PO) Take 5 mLs by mouth every 12 (twelve) hours as needed (cough).   Yes [provider]  DULoxetine (CYMBALTA) 30 MG capsule TAKE 2 CAPSULES BY MOUTH ONCE DAILY. Patient taking differently: Take 60 mg by mouth daily.  07/19/20  Yes [name], MD  ezetimibe (ZETIA) 10 MG tablet TAKE 1 TABLET BY MOUTH ONCE A DAY. Patient taking differently: Take 10 mg by mouth daily.  07/19/20  Yes [name], MD  furosemide (LASIX) 20 MG tablet TAKE 1 TABLET BY MOUTH ONCE A DAY. Patient taking differently: Take 20 mg by mouth daily.  07/19/20  Yes [name], MD  gabapentin (NEURONTIN) 300 MG capsule TAKE 1 CAPSULE BY MOUTH THREE TIMES A DAY. Patient taking differently: Take 300 mg by mouth 3 (three)  times daily.  11/14/19  Yes Newt Minion, MD  hydrOXYzine (ATARAX/VISTARIL) 25 MG tablet TAKE 1 TABLET BY MOUTH AT BEDTIME FOR ITCHING AND SLEEP. Patient taking differently: Take 25 mg by mouth at bedtime.  07/19/20  Yes [name], MD  metoprolol succinate (TOPROL-XL) 50 MG 24 hr tablet TAKE 1 TABLET BY MOUTH TWICE A DAY. Patient taking differently: Take 50 mg by mouth in the morning and at bedtime.  08/13/20  Yes [name], MD  montelukast (SINGULAIR) 10 MG tablet TAKE (1) TABLET BY MOUTH AT BEDTIME. Patient taking differently: Take 10 mg by mouth at bedtime.  07/19/20  Yes [name], MD  olmesartan (BENICAR) 40 MG tablet TAKE 1 TABLET BY MOUTH ONCE A DAY. Patient taking differently: Take 40 mg by mouth daily.  08/13/20  Yes [name], MD  pantoprazole (PROTONIX) 40 MG tablet TAKE 1 TABLET BY MOUTH ONCE A DAY. Patient taking differently: Take 40  mg by mouth daily.  07/19/20  Yes [name], MD  potassium chloride SA (KLOR-CON) 20 MEQ tablet TAKE 1&1/2 TABLETS (30MEQ) BY MOUTH DAILY. Patient taking differently: Take 30 mEq by mouth daily.  07/19/20  Yes [name], MD  rosuvastatin (CRESTOR) 10 MG tablet TAKE 1 TABLET BY MOUTH ONCE A DAY. Patient taking differently: Take 10 mg by mouth daily.  07/19/20  Yes [name], MD  solifenacin (VESICARE) 5 MG tablet TAKE 1 TABLET BY MOUTH ONCE DAILY. Patient taking differently: Take 5 mg by mouth daily.  07/19/20  Yes [name], MD  TRADJENTA 5 MG TABS tablet TAKE 1 TABLET BY MOUTH ONCE DAILY. Patient taking differently: Take 5 mg by mouth daily.  08/13/20  Yes [name], MD  traZODone (DESYREL) 50 MG tablet TAKE (1) TABLET BY MOUTH AT BEDTIME. Patient taking differently: Take 50 mg by mouth at bedtime.  07/19/20  Yes [name], MD  apixaban (ELIQUIS) 5 MG TABS tablet Take 5 mg by mouth 2 (two) times daily. Patient not taking: Reported on 08/14/2020    [provider]  tiotropium (SPIRIVA HANDIHALER) 18 MCG inhalation capsule Place 18 mcg into inhaler and inhale daily as needed (Shortness of breath).  Patient not taking: Reported on 08/14/2020    [provider]       CRITICAL CARE Performed by: Bonna Gains [place]   Total critical care time: 32 minutes  Critical care time was exclusive of separately billable procedures and treating other patients.  Critical care was necessary to treat or prevent imminent or life-threatening deterioration.  Critical care was time spent personally by me on the following activities: development of treatment plan with patient and/or surrogate as well as nursing, discussions with consultants, evaluation of patient's response to treatment, examination of patient, obtaining history from patient or surrogate, ordering and performing treatments and interventions, ordering and review of laboratory studies, ordering and review of radiographic studies, pulse oximetry and re-evaluation of patient's condition.

## 2020-08-15 NOTE — Progress Notes (Signed)
Pt transported to and from MRI with no complications noted. Vitals stable at this time, RT will continue to monitor.

## 2020-08-15 NOTE — Progress Notes (Signed)
PHARMACY NOTE:  ANTIMICROBIAL RENAL DOSAGE ADJUSTMENT  Current antimicrobial regimen includes a mismatch between antimicrobial dosage and estimated renal function.  As per policy approved by the Pharmacy & Therapeutics and Medical Executive Committees, the antimicrobial dosage will be adjusted accordingly.  Current antimicrobial dosage:  Ampicillin 2gm IV q4h  Indication: meningitis  Renal Function:  Estimated Creatinine Clearance: 31.7 mL/min (A) (by C-G formula based on SCr of 1.93 mg/dL (H)). []      On intermittent HD, scheduled: []      On CRRT    Antimicrobial dosage has been changed to:  Ampicillin 2gm q6h  Additional comments:   Thank you for allowing pharmacy to be a part of this patient's care.  Dolly Rias RPh 08/15/2020, 5:54 AM

## 2020-08-15 NOTE — Progress Notes (Addendum)
Chart review note MRI brain reviewed.  Subtle acute/early subacute infarct in the right basal ganglia-in my opinion likely incidental  Formal report below  IMPRESSION: 1. Small acute/early subacute infarct within the right basal ganglia 2. Scattered T2/FLAIR hyperintensities within the white matter, most likely secondary to age advanced chronic microvascular ischemic disease given clinical risk factors. 3. Numerous bilateral prior basal ganglia and thalamic microhemorrhages with additional larger area of prior hemorrhage in the right frontal lobe. While nonspecific, the distribution suggests prior hypertensive hemorrhages. 4. Small remote bilateral cerebellar lacunar infarcts.   Patient also underwent spinal tap-results pending.  I would not really be worried about that stroke-that is a very small and likely incidental stroke. I would focus on getting the results of the spinal tap and we will continue to follow with you tomorrow.  In the meantime, continue empiric antibiotic and antiviral coverage.  -- Amie Portland, MD [facility] Pager: [phone] If 7pm to 7am, please call on call as listed on AMION.   ADDENDUM LP with elevated protein and 10 WBCs. Normal glucose and 10 RBCs. Await CSF PCR for HSV. Less likely to be bacteria. Meningitic coverage can be discontinued and antibiotics can be used for systemic reasons if desired. Continue acyclovir. -- Amie Portland, MD [facility] Pager: [phone] If 7pm to 7am, please call on call as listed on AMION.

## 2020-08-15 NOTE — Progress Notes (Signed)
Initial Nutrition Assessment  RD working remotely.  DOCUMENTATION CODES:   Obesity unspecified  INTERVENTION:  - if patient to remain intubated another >/= 24 hours and OGT determined to be in an appropriate position, recommend initiation of TF via OGT.  - recommend Vital High Protein with 45 ml Prosource TF once/day. - this regimen + kcal from current propofol rate will provide 1316 kcal, 105 grams protein, and 903 ml free water.    NUTRITION DIAGNOSIS:   Inadequate oral intake related to inability to eat as evidenced by NPO status.  GOAL:   Provide needs based on ASPEN/SCCM guidelines  MONITOR:   Vent status, Labs, Weight trends  REASON FOR ASSESSMENT:   Ventilator  ASSESSMENT:   54 y.o. female with medical history of HTN, type 2 DM, afib on Eliquis, seizures, tobacco/polysubstance abuse, non-ischemic cardiomyopathy, stage 3 CKD, depression, prior CVA, and GERD. She presented to the ED after being found unresponsive on 10/19 night by her boyfriend. EMS was called and she was intubated after arrival to the [facility] ED before transferring to [facility].  Patient remains intubated with OGT to LIS with 400 ml output documented from night shift. No abdominal xray available for review.   Weight yesterday was 182 lb and weight has been stable for the past 13 months. Non-pitting edema to BUE documented in the flow sheet.   Neuro is following and recommendations made for LP for meningitis r/o and MRI brain with and without contrast. Concern for seizure activity--EEG was negative.    Patient is currently intubated on ventilator support MV: 10.2 L/min Temp (24hrs), Avg:99.1 F (37.3 C), Min:98.2 F (36.8 C), Max:99.5 F (37.5 C) Propofol: 7.43 ml/hr (196 kcal)  Labs reviewed; CBGs: 83 and 91 mg/dl, K: 5.7 mmol/l, Cl: 115 mmol/l, creatinine: 1.82 mg/dl, Ca: 7.9 mg/dl, GFR: 32 ml/min.  Medications reviewed; sliding scale novolog, 40 mg IV protonix/day, 10 mEq IV KCl x4 runs  10/20, 100 mg IV thiamine/day, 10 g lokelma x3 doses 10/21 and x3 doses 10/22.  IVF; LR @ 125 ml/hr.     NUTRITION - FOCUSED PHYSICAL EXAM:  unable to complete at this time.   Diet Order:   Diet Order            Diet NPO time specified  Diet effective now                 EDUCATION NEEDS:   No education needs have been identified at this time  Skin:  Skin Assessment: Reviewed RN Assessment  Last BM:  PTA/unknown  Height:   Ht Readings from Last 1 Encounters:  08/14/20 5\' 1"  (1.549 m)    Weight:   Wt Readings from Last 1 Encounters:  08/14/20 82.6 kg     Estimated Nutritional Needs:  Kcal:  2536-6440 kcal Protein:  >/= 95 grams Fluid:  >/= 1.8 L/day      Jarome Matin, MS, RD, LDN, CNSC Inpatient Clinical Dietitian RD pager # available in AMION  After hours/weekend pager # available in [facility]

## 2020-08-15 NOTE — Consult Note (Signed)
Neurology Consultation Reason for Consult: Concern for seizures Referring Physician: Larey Days  CC: n/a due to intubated/sedated  History is obtained from: Chart review; unable to reach family overnight   HPI: [name] is a 54 y.o. female with past medical history significant for polysubstance abuse (alcohol, cocaine, tobacco, marijuana), diabetes, hypertension, sleep apnea, CKD, obesity, prior strokes, COPD, congestive heart failure, atrial fibrillation (prescribed but likely not taking apixaban) and medication noncompliance.  She presented after being found down by her boyfriend at 8:30 PM on 10/19.  There was concern for seizure activity and unclear history of possible prior seizures (noted by teleconsultant but not noted in any previous notes or in the chart, not on any antiseizure medications at home per chart review)  She was intubated for mental status, found to be in DKA with BG in the 700s, AG > 20 on presentation, and transported from [facility] to [facility] for further medical care.  Notes in the chart indicate that family stated she had not been taking any of her medications recently.  Her urine tox was notably positive for cocaine.  She was started on keppra 1000 mg BID for seizure-like activity; spot EEG without epileptiform activity Hospital course additionally notable for fever (T-max 39 at 330 on 10/19), started on broad-spectrum coverage with cefepime, metronidazole and vancomycin for presumed aspiration pneumonia, blood culture obtained negative to date. She was additionally started on amiodarone and heparin drip for atrial fibrillation  LKW: Unclear tPA given?: No  ROS: Unable to obtain due to altered mental status.   Past Medical History:  Diagnosis Date  . Alcohol use   . Ankle fracture, lateral malleolus, closed 12/30/2011  . Anxiety   . Breast mass, left 12/15/2011  . Chronic anemia   . Chronic combined systolic and diastolic CHF (congestive heart  failure) ([place])   . CKD (chronic kidney disease), stage III ([place])    pt denies  . Cocaine abuse ([place])   . COPD (chronic obstructive pulmonary disease) ([place])   . Diabetes mellitus, type 2 ([place])   . Diabetic Charcot foot ([place]) 12/30/2011  . Essential hypertension   . History of GI bleed 12/05/2009   Qualifier: Diagnosis of  By: Zeb Comfort    . Hyperlipidemia   . Hypertensive cardiomyopathy ([place]) 11/08/2012   a. 05/2016: echo showing EF of 20-25% with normal cors by cath in 07/2016.  . Morbid obesity ([place]) 01/31/2008   Qualifier: Diagnosis of  By: Lenn Cal    . Noncompliance with medication regimen   . Obesity   . Panic attacks   . PAT (paroxysmal atrial tachycardia) ([place])    a. started on amiodarone 07/2017 for this.  . Sleep apnea    does not use cpap  . Stroke ([place])    06/24/17  . Tobacco abuse   . Urinary incontinence    Past Surgical History:  Procedure Laterality Date  . BREAST BIOPSY    . CARDIAC CATHETERIZATION N/A 07/28/2016   Procedure: Left Heart Cath and Coronary Angiography;  Surgeon: Jettie Booze, MD;  Location: [facility];  Service: Cardiovascular;  Laterality: N/A;  . COLONOSCOPY N/A 05/10/2019   Procedure: COLONOSCOPY;  Surgeon: Danie Binder, MD;  [facility];  Service: Endoscopy;  Laterality: N/A;  Phenergan 12.5 mg IV in pre-op  . DILATION AND CURETTAGE OF UTERUS    . I & D EXTREMITY Bilateral 09/22/2017   Procedure: BILATERAL DEBRIDEMENT LEG/FOOT ULCERS, APPLY VERAFLO WOUND VAC;  Surgeon:  Newt Minion, MD;  Location: [facility];  Service: Orthopedics;  Laterality: Bilateral;  . I & D EXTREMITY Right 10/11/2018   Procedure: IRRIGATION AND DEBRIDEMENT RIGHT HAND;  Surgeon: [name], MD;  Location: [place];  Service: Orthopedics;  Laterality: Right;  . I & D EXTREMITY Right 10/13/2018   Procedure: REPEAT IRRIGATION AND DEBRIDEMENT RIGHT HAND;  Surgeon: [name], MD;  Location: [place] [facility];  Service: Orthopedics;  Laterality:  Right;  . I & D EXTREMITY Right 11/22/2018   Procedure: IRRIGATION AND DEBRIDEMENT AND PINNING RIGHT HAND;  Surgeon: [name], MD;  Location: [place];  Service: Orthopedics;  Laterality: Right;  . IR RADIOLOGIST EVAL & MGMT  07/05/2018  . POLYPECTOMY  05/10/2019   Procedure: POLYPECTOMY;  Surgeon: Danie Binder, MD;  [facility];  Service: Endoscopy;;  . SKIN SPLIT GRAFT Bilateral 09/28/2017   Procedure: BILATERAL SPLIT THICKNESS SKIN GRAFT LEGS/FEET AND APPLY VAC;  Surgeon: Newt Minion, MD;  Location: [place];  Service: Orthopedics;  Laterality: Bilateral;  . SKIN SPLIT GRAFT Right 11/22/2018   Procedure: SKIN GRAFT SPLIT THICKNESS;  Surgeon: [name], MD;  Location: [place];  Service: Orthopedics;  Laterality: Right;   Current Outpatient Medications  Medication Instructions  . albuterol (PROAIR HFA) 108 (90 Base) MCG/ACT inhaler INHALE 2 PUFFS EVERY 6 HOURS AS NEEDED FOR SHORTNESS OF BREATH/WHEEZING.  Marland Kitchen amLODipine (NORVASC) 10 MG tablet TAKE 1 TABLET BY MOUTH ONCE A DAY.  . cloNIDine (CATAPRES) 0.3 MG tablet TAKE 1 TABLET BY MOUTH AT BEDTIME FOR BLOOD PRESSURE  . clopidogrel (PLAVIX) 75 MG tablet TAKE 1 TABLET BY MOUTH DAILY WITH BREAKFAST.  . DULoxetine (CYMBALTA) 30 MG capsule TAKE 2 CAPSULES BY MOUTH ONCE DAILY.  Marland Kitchen Eliquis 5 mg, 2 times daily  . ezetimibe (ZETIA) 10 MG tablet TAKE 1 TABLET BY MOUTH ONCE A DAY.  . furosemide (LASIX) 20 MG tablet TAKE 1 TABLET BY MOUTH ONCE A DAY.  Marland Kitchen gabapentin (NEURONTIN) 300 MG capsule TAKE 1 CAPSULE BY MOUTH THREE TIMES A DAY.  . hydrOXYzine (ATARAX/VISTARIL) 25 MG tablet TAKE 1 TABLET BY MOUTH AT BEDTIME FOR ITCHING AND SLEEP.  Marland Kitchen metoprolol succinate (TOPROL-XL) 50 MG 24 hr tablet TAKE 1 TABLET BY MOUTH TWICE A DAY.  . montelukast (SINGULAIR) 10 MG tablet TAKE (1) TABLET BY MOUTH AT BEDTIME.  Marland Kitchen olmesartan (BENICAR) 40 MG tablet TAKE 1 TABLET BY MOUTH ONCE A DAY.  . pantoprazole (PROTONIX) 40 MG tablet TAKE 1 TABLET BY MOUTH ONCE A  DAY.  Marland Kitchen potassium chloride SA (KLOR-CON) 20 MEQ tablet TAKE 1&1/2 TABLETS (30MEQ) BY MOUTH DAILY.  . rosuvastatin (CRESTOR) 10 MG tablet TAKE 1 TABLET BY MOUTH ONCE A DAY.  . solifenacin (VESICARE) 5 MG tablet TAKE 1 TABLET BY MOUTH ONCE DAILY.  Marland Kitchen Spiriva HandiHaler 18 mcg, Daily PRN  . TRADJENTA 5 MG TABS tablet TAKE 1 TABLET BY MOUTH ONCE DAILY.  . traZODone (DESYREL) 50 MG tablet TAKE (1) TABLET BY MOUTH AT BEDTIME.     Family History  Problem Relation Age of Onset  . Hypertension Mother   . Heart attack Mother   . Hypertension Father        CABG   . Hypertension Sister   . Hypertension Brother   . Hypertension Sister   . Cancer Sister        breast   . Arthritis Other   . Cancer Other   . Diabetes Other   . Asthma Other   .  Hypertension Daughter   . Hypertension Son     Social History:  reports that she has been smoking cigarettes. She has a 20.00 pack-year smoking history. She has never used smokeless tobacco. She reports previous drug use. Frequency: 3.00 times per week. Drugs: Marijuana and Cocaine. She reports that she does not drink alcohol.   Exam: Current vital signs: BP (!) 145/87   Pulse 67   Temp 98.8 F (37.1 C)   Resp 16   Ht 5\' 1"  (1.549 m)   Wt 82.6 kg   LMP 05/19/2016   SpO2 98%   BMI 34.41 kg/m  Vital signs in last 24 hours: Temp:  [98.8 F (37.1 C)-102.2 F (39 C)] 98.8 F (37.1 C) (10/20 2300) Pulse Rate:  [31-113] 67 (10/20 2300) Resp:  [16-29] 16 (10/20 2300) BP: (88-150)/(52-108) 145/87 (10/20 2300) SpO2:  [94 %-100 %] 98 % (10/20 2300) FiO2 (%):  [30 %] 30 % (10/20 2131) Weight:  [82.6 kg] 82.6 kg (10/20 1300)   Physical Exam  Constitutional: Appears chronically ill, comfortable in bed Psych: Unresponsive Eyes: Scleral edema left greater than right  HENT: ET tube in place, tongue protruding MSK: Charcot deformities of bilateral feet.  Cardiovascular: Palpable radial pulses bilaterally Respiratory: Breathing comfortably on the  ventilator GI: Soft.  No distension. There is no tenderness.  Skin: Warm dry and intact visible skin  Neuro: Mental Status/sensorimotor: After propofol was paused for about 30 minutes the patient was able to wiggle bilateral feet to command.  She did not have any significant movement in the bilateral upper extremities, though she did grimace to pain more on the left upper extremity than the right upper extremity Cranial Nerves: II: No blink to threat.  Pupils are 1.5 to 1 mm sluggishly reactive bilaterally III,IV, VI: Minimal VOR with lateral head rotation though somewhat limited by scleral edema V/VII: Brisk corneals bilaterally VIII: No response to voice X/XI: Intact cough and gag Deep Tendon Reflexes: 2+ and symmetric in the biceps and patellae, absent brachioradialis.  Plantars: Toes are downgoing bilaterally. Cerebellar: Unable to assess secondary to patient's mental status   I have reviewed labs in epic and the results pertinent to this consultation are: Glucose normalized to 83, Ag 11,   I have personally reviewed the images obtained: Head CT without acute intracranial process, chronic microvascular disease   Impression: This is a 54 year old woman with past medical history of polysubstance use including cocaine and marijuana, medication noncompliance and multiple vascular risk factors as above including atrial fibrillation not on anticoagulation, presenting with fever and altered mental status, with concern for seizures on presentation.  Given that she does appear to be reliably wiggling her feet when they are tapped and she is asked to wiggle them, I do not think she is currently in status.   Seizures are likely provoked in the setting of cocaine use, hyperglycemia, and may be the etiology of her initial fever, however infection should be carefully excluded including meningitis.  Additionally she has multiple risk factors for stroke including her cocaine use which can cause  vasospasm, as well as potential embolic phenomenon from atrial fibrillation off of anticoagulation. MRI brain will be a more sensitive test to evaluate for acute stroke as CT can be negative in the first few hours post event.  PRES/RCVS are also on the differential as these can be triggered by cocaine use as well; her blood pressures have been labile  Recommendations:  #Fever and altered mental status -Meningitis coverage, have asked  CCM to consider changing cefepime to ceftriaxone and adding ampicillin -Pharmacy consult for acyclovir dosing in the setting of renal failure for meningitis coverage -Lumbar puncture after heparin drip has been stopped for at least 6 hours (stopped at approximately 3 AM) -MRI brain with and without contrast if GFR greater than 30 to assess for infection as well as possible stroke (which may mention up on the initial head CT due to acuity) -Appreciate CCM investigation for other sources of infection  #Concern for seizure -Possibly provoked in the setting of cocaine, DKA, -EEG negative -Clinical exam appears to be improving -Continue to monitor clinically, does not need long-term monitoring if her mental status continues to improve -Wean sedation after MRI and lumbar puncture is completed -Okay to continue Keppra for now, renally dose adjust as renal function improves or if it substantially worsens   Lesleigh Noe MD-PhD [facility] [phone]

## 2020-08-15 NOTE — Progress Notes (Signed)
[facility] Progress Note Patient Name: [name] DOB: 1965/11/06 MRN: [id number]   Date of Service  08/15/2020  HPI/Events of Note  Nursing reports cool hands with weak and thready bilateral radial pulses. Patient on Heparin IV infusion. BP = 115/84. LVEF =  50-55%.   eICU Interventions  Plan: 1. Bolus with 0.9 NxCl 1 liter IV over 1 hour now.      Intervention Category Major Interventions: Other:  Lysle Dingwall 08/15/2020, 12:10 AM

## 2020-08-15 NOTE — Progress Notes (Signed)
Pharmacy Antibiotic Note  [name] is a 54 y.o. female admitted on 08/13/2020 having transferred from [facility] for presumed evaluation of seizure likely provoked by febrile illness and hyperglycemia in patient with reported h/o seizure disorder.   Pharmacy has been consulted for acyclovir dosing for herpes encephalitis.  Plan: Acyclovir 600mg  IV q12h (adjusted body wt and renally dosed) Follow renal function  Height: 5\' 1"  (154.9 cm) Weight: 82.6 kg (182 lb 1.6 oz) IBW/kg (Calculated) : 47.8  Temp (24hrs), Avg:99.9 F (37.7 C), Min:98.2 F (36.8 C), Max:101.8 F (38.8 C)  Recent Labs  Lab 08/13/20 2044 08/14/20 0002 08/14/20 0253 08/14/20 0618 08/14/20 0940 08/14/20 1223 08/15/20 0320  WBC 10.9*  --   --   --   --   --   --   CREATININE 1.88*  --  1.69* 1.66*  --   --  1.93*  LATICACIDVEN 10.1* 5.2*  --   --  3.0* 2.5*  --     Estimated Creatinine Clearance: 31.7 mL/min (A) (by C-G formula based on SCr of 1.93 mg/dL (H)).    No Known Allergies   Thank you for allowing pharmacy to be a part of this patient's care.  Dolly Rias RPh 08/15/2020, 5:05 AM

## 2020-08-16 DIAGNOSIS — R509 Fever, unspecified: Secondary | ICD-10-CM

## 2020-08-16 DIAGNOSIS — G9340 Encephalopathy, unspecified: Secondary | ICD-10-CM

## 2020-08-16 DIAGNOSIS — I4891 Unspecified atrial fibrillation: Secondary | ICD-10-CM | POA: Diagnosis not present

## 2020-08-16 DIAGNOSIS — G40409 Other generalized epilepsy and epileptic syndromes, not intractable, without status epilepticus: Secondary | ICD-10-CM | POA: Diagnosis not present

## 2020-08-16 DIAGNOSIS — G0490 Encephalitis and encephalomyelitis, unspecified: Secondary | ICD-10-CM | POA: Diagnosis not present

## 2020-08-16 DIAGNOSIS — J9600 Acute respiratory failure, unspecified whether with hypoxia or hypercapnia: Secondary | ICD-10-CM | POA: Diagnosis not present

## 2020-08-16 DIAGNOSIS — N179 Acute kidney failure, unspecified: Secondary | ICD-10-CM | POA: Diagnosis not present

## 2020-08-16 LAB — CBC
HCT: 40 % (ref 36.0–46.0)
Hemoglobin: 12.8 g/dL (ref 12.0–15.0)
MCH: 29.9 pg (ref 26.0–34.0)
MCHC: 32 g/dL (ref 30.0–36.0)
MCV: 93.5 fL (ref 80.0–100.0)
Platelets: 162 10*3/uL (ref 150–400)
RBC: 4.28 MIL/uL (ref 3.87–5.11)
RDW: 14.5 % (ref 11.5–15.5)
WBC: 7.9 10*3/uL (ref 4.0–10.5)
nRBC: 0 % (ref 0.0–0.2)

## 2020-08-16 LAB — AMMONIA: Ammonia: 37 umol/L — ABNORMAL HIGH (ref 9–35)

## 2020-08-16 LAB — GLUCOSE, CAPILLARY
Glucose-Capillary: 117 mg/dL — ABNORMAL HIGH (ref 70–99)
Glucose-Capillary: 170 mg/dL — ABNORMAL HIGH (ref 70–99)
Glucose-Capillary: 211 mg/dL — ABNORMAL HIGH (ref 70–99)
Glucose-Capillary: 251 mg/dL — ABNORMAL HIGH (ref 70–99)
Glucose-Capillary: 254 mg/dL — ABNORMAL HIGH (ref 70–99)
Glucose-Capillary: 265 mg/dL — ABNORMAL HIGH (ref 70–99)

## 2020-08-16 LAB — COMPREHENSIVE METABOLIC PANEL
ALT: 9 U/L (ref 0–44)
AST: 15 U/L (ref 15–41)
Albumin: 2.2 g/dL — ABNORMAL LOW (ref 3.5–5.0)
Alkaline Phosphatase: 77 U/L (ref 38–126)
Anion gap: 14 (ref 5–15)
BUN: 17 mg/dL (ref 6–20)
CO2: 18 mmol/L — ABNORMAL LOW (ref 22–32)
Calcium: 8.2 mg/dL — ABNORMAL LOW (ref 8.9–10.3)
Chloride: 114 mmol/L — ABNORMAL HIGH (ref 98–111)
Creatinine, Ser: 1.95 mg/dL — ABNORMAL HIGH (ref 0.44–1.00)
GFR, Estimated: 30 mL/min — ABNORMAL LOW (ref >60)
Glucose, Bld: 175 mg/dL — ABNORMAL HIGH (ref 70–99)
Potassium: 3.3 mmol/L — ABNORMAL LOW (ref 3.5–5.1)
Sodium: 146 mmol/L — ABNORMAL HIGH (ref 135–145)
Total Bilirubin: 0.8 mg/dL (ref 0.3–1.2)
Total Protein: 5.3 g/dL — ABNORMAL LOW (ref 6.5–8.1)

## 2020-08-16 LAB — PHOSPHORUS
Phosphorus: 3.2 mg/dL (ref 2.5–4.6)
Phosphorus: 3.2 mg/dL (ref 2.5–4.6)

## 2020-08-16 LAB — TRIGLYCERIDES: Triglycerides: 213 mg/dL — ABNORMAL HIGH (ref <150)

## 2020-08-16 LAB — MAGNESIUM
Magnesium: 1.7 mg/dL (ref 1.7–2.4)
Magnesium: 1.6 mg/dL — ABNORMAL LOW (ref 1.7–2.4)

## 2020-08-16 LAB — PROCALCITONIN: Procalcitonin: 0.1 ng/mL

## 2020-08-16 MED ORDER — LEVETIRACETAM 100 MG/ML PO SOLN
750.0000 mg | Freq: Two times a day (BID) | ORAL | Status: DC
  Administered 2020-08-16 – 2020-08-20 (×8): 750 mg
  Filled 2020-08-16 (×9): qty 7.5

## 2020-08-16 MED ORDER — FREE WATER
200.0000 mL | Freq: Four times a day (QID) | Status: DC
  Administered 2020-08-16 – 2020-08-19 (×12): 200 mL

## 2020-08-16 MED ORDER — PROSOURCE TF PO LIQD
45.0000 mL | Freq: Two times a day (BID) | ORAL | Status: DC
  Administered 2020-08-16 – 2020-08-20 (×9): 45 mL
  Filled 2020-08-16 (×9): qty 45

## 2020-08-16 MED ORDER — APIXABAN 5 MG PO TABS
5.0000 mg | ORAL_TABLET | Freq: Two times a day (BID) | ORAL | Status: DC
  Administered 2020-08-16 – 2020-08-20 (×8): 5 mg
  Filled 2020-08-16 (×8): qty 1

## 2020-08-16 MED ORDER — APIXABAN 5 MG PO TABS: 5.0000 mg | ORAL_TABLET | Freq: Two times a day (BID) | ORAL | Status: DC

## 2020-08-16 MED ORDER — THIAMINE HCL 100 MG PO TABS
100.0000 mg | ORAL_TABLET | Freq: Every day | ORAL | Status: DC
  Administered 2020-08-16 – 2020-08-18 (×3): 100 mg
  Filled 2020-08-16 (×3): qty 1

## 2020-08-16 MED ORDER — VITAL AF 1.2 CAL PO LIQD
1000.0000 mL | ORAL | Status: DC
  Administered 2020-08-16 – 2020-08-19 (×4): 1000 mL

## 2020-08-16 MED ORDER — DEXMEDETOMIDINE HCL IN NACL 200 MCG/50ML IV SOLN
0.4000 ug/kg/h | INTRAVENOUS | Status: DC
  Administered 2020-08-16: 0.8 ug/kg/h via INTRAVENOUS
  Administered 2020-08-16 (×4): 0.4 ug/kg/h via INTRAVENOUS
  Administered 2020-08-16: 0.6 ug/kg/h via INTRAVENOUS
  Administered 2020-08-17 (×3): 1.2 ug/kg/h via INTRAVENOUS
  Administered 2020-08-17 (×2): 1 ug/kg/h via INTRAVENOUS
  Filled 2020-08-16: qty 100
  Filled 2020-08-16 (×9): qty 50

## 2020-08-16 MED ORDER — POTASSIUM CHLORIDE 20 MEQ/15ML (10%) PO SOLN
40.0000 meq | Freq: Once | ORAL | Status: AC
  Administered 2020-08-16: 40 meq via ORAL
  Filled 2020-08-16: qty 30

## 2020-08-16 MED ORDER — VITAL HIGH PROTEIN PO LIQD: 1000.0000 mL | ORAL | Status: DC

## 2020-08-16 MED ORDER — PANTOPRAZOLE SODIUM 40 MG PO PACK
40.0000 mg | PACK | Freq: Every day | ORAL | Status: DC
  Administered 2020-08-16 – 2020-08-20 (×5): 40 mg
  Filled 2020-08-16 (×5): qty 20

## 2020-08-16 MED ORDER — INSULIN GLARGINE 100 UNIT/ML ~~LOC~~ SOLN
10.0000 [IU] | Freq: Every day | SUBCUTANEOUS | Status: DC
  Administered 2020-08-16 – 2020-08-18 (×3): 10 [IU] via SUBCUTANEOUS
  Filled 2020-08-16 (×3): qty 0.1

## 2020-08-16 NOTE — Progress Notes (Signed)
CRITICAL VALUE ALERT  Critical Value:  K+3.3  Date & Time Notied:  10/16/20 0900  Provider Notified: Dr. Silas Flood  Orders Received/Actions taken: Lokelma d/c oral K+ order placed

## 2020-08-16 NOTE — Progress Notes (Signed)
NAME:  MISTI TOWLE, MRN:  [id number], DOB:  [birth year], LOS: 3 ADMISSION DATE:  08/13/2020, CONSULTATION DATE:  08/14/20 REFERRING MD:  edp, CHIEF COMPLAINT:  Acute resp    Brief History   54 yobf smoker with HBP no known sz disorder to ER Pm 10/20 with ams then sz with w/u pos for AG met acidosis/ fever to 102 ? Source and rec to trx to [facility] when bed available / PCCM service at [facility] asked to consult awaiting trx.   History of present illness    54 y.o. female   presented to the emergency department 8:30 pm  after being found unresponsive by her boyfriend.  He last saw her well earlier in the evening of 10/19.  He states he went to get something to eat and then came back and found her unresponsive.  EMS arrived to find the patient with seizure activity which was intermittent.  They established IV access and transported her immediately to the emergency department.  She had nonrebreather on arrival.  She would awaken to pain but not provide any verbal response.  Unknown history of seizure.  Boyfriend and family are unsure of the patient's exact medical history. Pt started gm sz on arrival and was intubated and placed on diprovan drip with labs c/w Met acidosis ? dka > CT head neg and arranged for tx to [facility] pm 10/20 with no furhter sz and temp to 102 s source identified.  Past Medical History   Alcohol use  Anxiety Chronic anemia Chronic combined systolic and diastolic CHF (congestive heart failure) (HCC)  CKD (chronic kidney disease), stage III (HCC)  Cocaine abuse (HCC) COPD (chronic obstructive pulmonary disease) ([place]) Diabetes mellitus, type 2 ([place])  Diabetic Charcot foot ([place])  Essential hypertension  Morbid obesity ([place]) (01/31/2008)  Noncompliance with medication regimen PAT (paroxysmal atrial tachycardia) (HCC) Sleep apnea Stroke ([place]), Tobacco abuse Urinary incontinence.    Significant Hospital Events   Afib 10/19 pm > amiodarone rx   Consults:  Neurology  10/19 PCCM         10/20   Procedures:    Significant Diagnostic Tests:  CT head 10/19  Neg UDS   10/19  Pos cocaine, etoh  < 10    Micro Data:  Covid 19  PCR   10/19   Neg Flu  A/B   PCR    10/19   Neg  BC x 2    10/19 neg  U/a  Neg leuk/nitries    Antimicrobials:  Cefepime  10/19>>> Flagyl  10/19 >>>10/21 Vanc    10/19 >>10/21 Ampicillin 10/21>> Acyclovir 10/21>>   Scheduled Meds: . chlorhexidine gluconate (MEDLINE KIT)  15 mL Mouth Rinse BID  . Chlorhexidine Gluconate Cloth  6 each Topical Daily  . heparin  5,000 Units Subcutaneous Q8H  . insulin aspart  0-15 Units Subcutaneous Q4H  . mouth rinse  15 mL Mouth Rinse 10 times per day  . metoprolol tartrate  12.5 mg Per Tube Q6H  . pantoprazole (PROTONIX) IV  40 mg Intravenous Q24H  . sodium zirconium cyclosilicate  10 g Per Tube TID  . thiamine injection  100 mg Intravenous Daily   Continuous Infusions: . acyclovir 600 mg (08/16/20 0827)  . dexmedetomidine (PRECEDEX) IV infusion 0.4 mcg/kg/hr (08/16/20 0811)  . lactated ringers 125 mL/hr at 08/16/20 0754  . levETIRAcetam 750 mg (08/16/20 0828)   PRN Meds:.dextrose   Interim history/subjective:  Sedation off, moves all extremities spontaneously. Not reliably following commands, Very tachypneic  on PSV.   Objective   Blood pressure (!) 139/101, pulse 93, temperature 99.8 F (37.7 C), temperature source Axillary, resp. rate (!) 25, height '5\' 1"'  (1.549 m), weight 82.6 kg, last menstrual period 05/19/2016, SpO2 97 %.    Vent Mode: PRVC FiO2 (%):  [30 %] 30 % Set Rate:  [12 bmp] 12 bmp Vt Set:  [380 mL] 380 mL PEEP:  [5 cmH20] 5 cmH20 Plateau Pressure:  [11 cmH20-18 cmH20] 18 cmH20   Intake/Output Summary (Last 24 hours) at 08/16/2020 0849 Last data filed at 08/16/2020 0754 Gross per 24 hour  Intake 3433.01 ml  Output 2800 ml  Net 633.01 ml   Filed Weights   08/14/20 1300  Weight: 82.6 kg    Examination: General: awake, not oriented or alert HENT: ET in place Lungs:  clear, mechanical breath sounds Cardiovascular: RRR, no murmur Abdomen: obese, soft Extremities: warm, no edema Neuro: sedated, no nystagmus   Labs and Studies personally reviewed MRI with R acute/subacute infarct in basal ganglia, old strokes likely due to HTN     Resolved Hospital Problem list      Assessment & Plan:  Acute resp failure in setting of new onset sz/ mild dka - minimal vent settings - VAP protocol - Weaned sedation, moves extremities, not following commands, thick secretions, failed SBT - precedex gtt (very sensitive to prop) to try to aid vent wean, fentanyl PRN  New onset SZ  Assoc with hyperglycemia, acute R basal ganglia stroke  - keppra - BG control  Toxic Metabolic encephalopathy: Multifactorial and related to stroke, seizure, hyperglycemia, possible viral encephalitis. -Vent as above -LP with 10 wbcs, glucose ok, awaiting HSV/VZV PCR -Continue acyclovir  Atrial fibrillation  - NSR on metop - holding anticoagulation for now, resume Eliquis 10/22 PM  Lactic acidosis: Due to seizure, AG better.   AKI: Cr elevated, in setting of critical illness. Suspect hypovolemia as not getting TFs. Possible progression of CKD in setting of drug use, HTN.  --CTM --TFs   Best practice:  Diet: TFs Pain/Anxiety/Delirium protocol (if indicated): precedex, prn fentanyl  VAP protocol (if indicated): per protocol DVT prophylaxis: Eliquis GI prophylaxis: ppi Glucose control: SSI Mobility: sbr Code Status: full code Family Communication: updated brother 10/21 Disposition: ICU  Labs   CBC: Recent Labs  Lab 08/13/20 2044 08/15/20 0647 08/16/20 0811  WBC 10.9* 8.5 7.9  NEUTROABS 8.0*  --   --   HGB 15.0 13.8 12.8  HCT 47.1* 43.0 40.0  MCV 92.5 92.5 93.5  PLT 241 174 008    Basic Metabolic Panel: Recent Labs  Lab 08/13/20 2044 08/14/20 0253 08/14/20 0618 08/15/20 0320 08/15/20 0647  NA 135 140 141 140  --   K 2.6* 3.1* 3.0* 5.7*  --   CL 94* 105  104 115*  --   CO2 20* 24 23 14*  --   GLUCOSE 772* 392* 233* 93  --   BUN '14 14 15 17  ' --   CREATININE 1.88* 1.69* 1.66* 1.93* 1.82*  CALCIUM 8.9 8.3* 8.4* 7.9*  --   MG  --   --  1.7  --   --    GFR: Estimated Creatinine Clearance: 33.6 mL/min (A) (by C-G formula based on SCr of 1.82 mg/dL (H)). Recent Labs  Lab 08/13/20 2044 08/14/20 0002 08/14/20 0940 08/14/20 1223 08/15/20 0647 08/16/20 0301 08/16/20 0811  PROCALCITON  --   --   --  0.15  --  0.10  --   WBC  10.9*  --   --   --  8.5  --  7.9  LATICACIDVEN 10.1* 5.2* 3.0* 2.5*  --   --   --     Liver Function Tests: Recent Labs  Lab 08/13/20 2044  AST 19  ALT 16  ALKPHOS 112  BILITOT 0.9  PROT 6.9  ALBUMIN 3.1*   No results for input(s): LIPASE, AMYLASE in the last 168 hours. No results for input(s): AMMONIA in the last 168 hours.  ABG    Component Value Date/Time   PHART 7.348 (L) 08/13/2020 2256   PCO2ART 46.0 08/13/2020 2256   PO2ART 205 (H) 08/13/2020 2256   HCO3 23.8 08/13/2020 2256   TCO2 17 09/11/2014 1152   ACIDBASEDEF 0.3 08/13/2020 2256   O2SAT 99.9 08/13/2020 2256     Coagulation Profile: Recent Labs  Lab 08/13/20 2044  INR 1.0    Cardiac Enzymes: No results for input(s): CKTOTAL, CKMB, CKMBINDEX, TROPONINI in the last 168 hours.  HbA1C: Hgb A1c MFr Bld  Date/Time Value Ref Range Status  08/14/2020 12:23 PM 13.8 (H) 4.8 - 5.6 % Final    Comment:    (NOTE)         Prediabetes: 5.7 - 6.4         Diabetes: >6.4         Glycemic control for adults with diabetes: <7.0   05/09/2019 09:56 AM 8.3 (H) 4.8 - 5.6 % Final    Comment:    (NOTE) Pre diabetes:          5.7%-6.4% Diabetes:              >6.4% Glycemic control for   <7.0% adults with diabetes     CBG: Recent Labs  Lab 08/15/20 1522 08/15/20 2007 08/15/20 2329 08/16/20 0258 08/16/20 0826  GLUCAP 89 149* 153* 117* 170*      Surgical History    Past Surgical History:  Procedure Laterality Date  . BREAST BIOPSY     . CARDIAC CATHETERIZATION N/A 07/28/2016   Procedure: Left Heart Cath and Coronary Angiography;  Surgeon: Jettie Booze, MD;  Location: [place] [facility];  Service: Cardiovascular;  Laterality: N/A;  . COLONOSCOPY N/A 05/10/2019   Procedure: COLONOSCOPY;  Surgeon: Danie Binder, MD;  [facility];  Service: Endoscopy;  Laterality: N/A;  Phenergan 12.5 mg IV in pre-op  . DILATION AND CURETTAGE OF UTERUS    . I & D EXTREMITY Bilateral 09/22/2017   Procedure: BILATERAL DEBRIDEMENT LEG/FOOT ULCERS, APPLY VERAFLO WOUND VAC;  Surgeon: Newt Minion, MD;  Location: [place];  Service: Orthopedics;  Laterality: Bilateral;  . I & D EXTREMITY Right 10/11/2018   Procedure: IRRIGATION AND DEBRIDEMENT RIGHT HAND;  Surgeon: [name], MD;  Location: [place];  Service: Orthopedics;  Laterality: Right;  . I & D EXTREMITY Right 10/13/2018   Procedure: REPEAT IRRIGATION AND DEBRIDEMENT RIGHT HAND;  Surgeon: [name], MD;  Location: [place];  Service: Orthopedics;  Laterality: Right;  . I & D EXTREMITY Right 11/22/2018   Procedure: IRRIGATION AND DEBRIDEMENT AND PINNING RIGHT HAND;  Surgeon: [name], MD;  Location: [place];  Service: Orthopedics;  Laterality: Right;  . IR RADIOLOGIST EVAL & MGMT  07/05/2018  . POLYPECTOMY  05/10/2019   Procedure: POLYPECTOMY;  Surgeon: Danie Binder, MD;  [facility];  Service: Endoscopy;;  . SKIN SPLIT GRAFT Bilateral 09/28/2017   Procedure: BILATERAL SPLIT THICKNESS SKIN GRAFT LEGS/FEET AND APPLY VAC;  Surgeon: Newt Minion, MD;  Location: [place];  Service: Orthopedics;  Laterality: Bilateral;  . SKIN SPLIT GRAFT Right 11/22/2018   Procedure: SKIN GRAFT SPLIT THICKNESS;  Surgeon: [name], MD;  Location: [place];  Service: Orthopedics;  Laterality: Right;     Social History   reports that she has been smoking cigarettes. She has a 20.00 pack-year smoking history. She has never used smokeless tobacco. She reports previous drug  use. Frequency: 3.00 times per week. Drugs: Marijuana and Cocaine. She reports that she does not drink alcohol.   Family History   Her family history includes Arthritis in an other family member; Asthma in an other family member; Cancer in her sister and another family member; Diabetes in an other family member; Heart attack in her mother; Hypertension in her brother, daughter, father, mother, sister, sister, and son.   Allergies No Known Allergies   Home Medications  Prior to Admission medications   Medication Sig Start Date End Date Taking? Authorizing Provider  albuterol (PROAIR HFA) 108 (90 Base) MCG/ACT inhaler INHALE 2 PUFFS EVERY 6 HOURS AS NEEDED FOR SHORTNESS OF BREATH/WHEEZING. Patient taking differently: Inhale 1-2 puffs into the lungs every 6 (six) hours as needed for shortness of breath.  08/05/20  Yes [name], MD  amLODipine (NORVASC) 10 MG tablet TAKE 1 TABLET BY MOUTH ONCE A DAY. Patient taking differently: Take 10 mg by mouth daily.  07/19/20  Yes [name], MD  cloNIDine (CATAPRES) 0.3 MG tablet TAKE 1 TABLET BY MOUTH AT BEDTIME FOR BLOOD PRESSURE Patient taking differently: Take 0.3 mg by mouth at bedtime.  07/19/20  Yes [name], MD  clopidogrel (PLAVIX) 75 MG tablet TAKE 1 TABLET BY MOUTH DAILY WITH BREAKFAST. Patient taking differently: Take 75 mg by mouth daily.  07/19/20  Yes [name], MD  Dextromethorphan-guaiFENesin (DELSYM COUGH/CHEST CONGEST DM PO) Take 5 mLs by mouth every 12 (twelve) hours as needed (cough).   Yes [provider]  DULoxetine (CYMBALTA) 30 MG capsule TAKE 2 CAPSULES BY MOUTH ONCE DAILY. Patient taking differently: Take 60 mg by mouth daily.  07/19/20  Yes [name], MD  ezetimibe (ZETIA) 10 MG tablet TAKE 1 TABLET BY MOUTH ONCE A DAY. Patient taking differently: Take 10 mg by mouth daily.  07/19/20  Yes [name], MD  furosemide (LASIX) 20 MG tablet TAKE 1 TABLET BY MOUTH ONCE A  DAY. Patient taking differently: Take 20 mg by mouth daily.  07/19/20  Yes [name], MD  gabapentin (NEURONTIN) 300 MG capsule TAKE 1 CAPSULE BY MOUTH THREE TIMES A DAY. Patient taking differently: Take 300 mg by mouth 3 (three) times daily.  11/14/19  Yes Newt Minion, MD  hydrOXYzine (ATARAX/VISTARIL) 25 MG tablet TAKE 1 TABLET BY MOUTH AT BEDTIME FOR ITCHING AND SLEEP. Patient taking differently: Take 25 mg by mouth at bedtime.  07/19/20  Yes [name], MD  metoprolol succinate (TOPROL-XL) 50 MG 24 hr tablet TAKE 1 TABLET BY MOUTH TWICE A DAY. Patient taking differently: Take 50 mg by mouth in the morning and at bedtime.  08/13/20  Yes [name], MD  montelukast (SINGULAIR) 10 MG tablet TAKE (1) TABLET BY MOUTH AT BEDTIME. Patient taking differently: Take 10 mg by mouth at bedtime.  07/19/20  Yes [name], MD  olmesartan (BENICAR) 40 MG tablet TAKE 1 TABLET BY MOUTH ONCE A DAY. Patient taking differently: Take 40 mg by mouth daily.  08/13/20  Yes [name], MD  pantoprazole (PROTONIX) 40 MG tablet TAKE 1 TABLET BY MOUTH ONCE A DAY. Patient taking differently: Take 40 mg by mouth daily.  07/19/20  Yes [name], MD  potassium chloride SA (KLOR-CON) 20 MEQ tablet TAKE 1&1/2 TABLETS (30MEQ) BY MOUTH DAILY. Patient taking differently: Take 30 mEq by mouth daily.  07/19/20  Yes [name], MD  rosuvastatin (CRESTOR) 10 MG tablet TAKE 1 TABLET BY MOUTH ONCE A DAY. Patient taking differently: Take 10 mg by mouth daily.  07/19/20  Yes [name], MD  solifenacin (VESICARE) 5 MG tablet TAKE 1 TABLET BY MOUTH ONCE DAILY. Patient taking differently: Take 5 mg by mouth daily.  07/19/20  Yes [name], MD  TRADJENTA 5 MG TABS tablet TAKE 1 TABLET BY MOUTH ONCE DAILY. Patient taking differently: Take 5 mg by mouth daily.  08/13/20  Yes [name], MD  traZODone (DESYREL) 50 MG tablet TAKE (1) TABLET BY MOUTH AT  BEDTIME. Patient taking differently: Take 50 mg by mouth at bedtime.  07/19/20  Yes [name], MD  apixaban (ELIQUIS) 5 MG TABS tablet Take 5 mg by mouth 2 (two) times daily. Patient not taking: Reported on 08/14/2020    [provider]  tiotropium (SPIRIVA HANDIHALER) 18 MCG inhalation capsule Place 18 mcg into inhaler and inhale daily as needed (Shortness of breath).  Patient not taking: Reported on 08/14/2020    [provider]       CRITICAL CARE Performed by: Bonna Gains [place]   Total critical care time: 35 minutes  Critical care time was exclusive of separately billable procedures and treating other patients.  Critical care was necessary to treat or prevent imminent or life-threatening deterioration.  Critical care was time spent personally by me on the following activities: development of treatment plan with patient and/or surrogate as well as nursing, discussions with consultants, evaluation of patient's response to treatment, examination of patient, obtaining history from patient or surrogate, ordering and performing treatments and interventions, ordering and review of laboratory studies, ordering and review of radiographic studies, pulse oximetry and re-evaluation of patient's condition.

## 2020-08-16 NOTE — Progress Notes (Addendum)
Nutrition Follow-up  DOCUMENTATION CODES:   Obesity unspecified  INTERVENTION:  - will order TF regimen of Vital AF 1.2 @ 45 ml/hr with 45 ml Prosource TF BID. - this regimen will provide 1376 kcal, 103 grams protein, and 876 ml free water.  - order in place for 200 ml free water QID (800 ml/day).  NUTRITION DIAGNOSIS:   Inadequate oral intake related to inability to eat as evidenced by NPO status. -ongoing  GOAL:   Provide needs based on ASPEN/SCCM guidelines -unmet at this time  MONITOR:   Vent status, TF tolerance, Labs, Weight trends  REASON FOR ASSESSMENT:   Consult Enteral/tube feeding initiation and management  ASSESSMENT:   54 y.o. female with medical history of HTN, type 2 DM, afib on Eliquis, seizures, tobacco/polysubstance abuse, non-ischemic cardiomyopathy, stage 3 CKD, depression, prior CVA, and GERD. She presented to the ED after being found unresponsive on 10/19 night by her boyfriend. EMS was called and she was intubated after arrival to the [facility] ED before transferring to [facility].  Patient remains intubated with OGT in place. Patient discussed in rounds this AM. No family/visitors present at this time. No weight since admission (10/20). Non-pitting edema to BUE documented in the flow sheet.   Per notes: - acute respiratory failure in the setting of new onset seizures  - acure R basal ganglia stroke - toxic metabolic encephalopathy - AKI   Patient is currently intubated on ventilator support MV: 6.9 L/min Temp (24hrs), Avg:98.4 F (36.9 C), Min:97.2 F (36.2 C), Max:99.8 F (37.7 C) Propofol: none BP: 111/80 and MAP: 89   Labs reviewed; CBGs: 117, 170, 211 mg/dl, Na: 146 mmol/l, K: 3.3 mmol/l, Cl: 114 mmol/l, creatinine: 1.95 mg/dl, Ca: 8.2 mg/dl, GFR: 30 ml/min.  Medications reviewed; sliding scale novolog, 40 mg protonix per OGT/day, 40 mEq KCl per OGT x1 dose 10/22, 100 mg thiamine/day.  IVF; LR @ 125 ml/hr.  Drip; precedex @ 0.7  mcg/kg/hr.   Diet Order:   Diet Order            Diet NPO time specified  Diet effective now                 EDUCATION NEEDS:   No education needs have been identified at this time  Skin:  Skin Assessment: Reviewed RN Assessment  Last BM:  PTA/unknown  Height:   Ht Readings from Last 1 Encounters:  08/14/20 5\' 1"  (1.549 m)    Weight:   Wt Readings from Last 1 Encounters:  08/14/20 82.6 kg     Estimated Nutritional Needs:  Kcal:  6144-3154 kcal Protein:  >/= 95 grams Fluid:  >/= 1.8 L/day     Jarome Matin, MS, RD, LDN, CNSC Inpatient Clinical Dietitian RD pager # available in [facility]  After hours/weekend pager # available in [facility]

## 2020-08-16 NOTE — Progress Notes (Signed)
Neurology Progress Note   S:// Patient seen and examined. According to the nurse, she would spontaneously move her upper extremities randomly but not to command.   O:// Current vital signs: BP 111/80   Pulse 71   Temp 98.5 F (36.9 C) (Axillary)   Resp 15   Ht $R'5\' 1"'lX$  (1.549 m)   Wt 82.6 kg   LMP 05/19/2016   SpO2 99%   BMI 34.41 kg/m  Vital signs in last 24 hours: Temp:  [97.2 F (36.2 C)-99.8 F (37.7 C)] 98.5 F (36.9 C) (10/22 1200) Pulse Rate:  [67-93] 71 (10/22 1203) Resp:  [14-25] 15 (10/22 1200) BP: (95-178)/(62-125) 111/80 (10/22 1203) SpO2:  [96 %-100 %] 99 % (10/22 1200) FiO2 (%):  [30 %] 30 % (10/22 0835) Neurologic exam Continues to be sedated and intubated-sedated on Precedex which was held for the exam No spontaneous movements Pupils equal round reactive light No gaze preference or deviation Corneals present bilaterally Weak cough and gag No movement to noxious stimulation No withdrawal to noxious stimulation She did open eyes and was able to attend to voice by moving her eyes but did not follow any commands.  Respiratory: Vented CVS: Irregularly irregular Extremities: Warm well perfused with trace edema   Medications  Current Facility-Administered Medications:  .  acyclovir (ZOVIRAX) 600 mg in dextrose 5 % 100 mL IVPB, 600 mg, Intravenous, Q12H, Angela Adam, RPH, Stopped at 08/16/20 0930 .  apixaban (ELIQUIS) tablet 5 mg, 5 mg, Per Tube, BID, Hunsucker, Bonna Gains, MD .  chlorhexidine gluconate (MEDLINE KIT) (PERIDEX) 0.12 % solution 15 mL, 15 mL, Mouth Rinse, BID, Hunsucker, Bonna Gains, MD, 15 mL at 08/16/20 0829 .  Chlorhexidine Gluconate Cloth 2 % PADS 6 each, 6 each, Topical, Daily, Hunsucker, Bonna Gains, MD, 6 each at 08/16/20 [phone] .  dexmedetomidine (PRECEDEX) 200 MCG/50ML (4 mcg/mL) infusion, 0.4-1.2 mcg/kg/hr, Intravenous, Titrated, Hunsucker, Bonna Gains, MD, Last Rate: 8.26 mL/hr at 08/16/20 0952, 0.4 mcg/kg/hr at 08/16/20 0952 .   dextrose 50 % solution 0-50 mL, 0-50 mL, Intravenous, PRN, Long, Wonda Olds, MD .  feeding supplement (PROSource TF) liquid 45 mL, 45 mL, Per Tube, BID, Hunsucker, Bonna Gains, MD .  feeding supplement (VITAL AF 1.2 CAL) liquid 1,000 mL, 1,000 mL, Per Tube, Continuous, Hunsucker, Bonna Gains, MD .  free water 200 mL, 200 mL, Per Tube, Q6H, Hunsucker, Bonna Gains, MD, 200 mL at 08/16/20 1209 .  heparin injection 5,000 Units, 5,000 Units, Subcutaneous, Q8H, Hunsucker, Bonna Gains, MD, 5,000 Units at 08/16/20 0551 .  insulin aspart (novoLOG) injection 0-15 Units, 0-15 Units, Subcutaneous, Q4H, Shah, Pratik D, DO, 5 Units at 08/16/20 1201 .  lactated ringers infusion, , Intravenous, Continuous, Long, Wonda Olds, MD, Last Rate: 125 mL/hr at 08/16/20 0754, Rate Verify at 08/16/20 0754 .  levETIRAcetam (KEPPRA) 100 MG/ML solution 750 mg, 750 mg, Per Tube, BID, Amie Portland, MD .  MEDLINE mouth rinse, 15 mL, Mouth Rinse, 10 times per day, Hunsucker, Bonna Gains, MD, 15 mL at 08/16/20 1210 .  metoprolol tartrate (LOPRESSOR) 25 mg/10 mL oral suspension 12.5 mg, 12.5 mg, Per Tube, Q6H, Hunsucker, Bonna Gains, MD, 12.5 mg at 08/16/20 1203 .  pantoprazole sodium (PROTONIX) 40 mg/20 mL oral suspension 40 mg, 40 mg, Per Tube, Daily, Eudelia Bunch, RPH, 40 mg at 08/16/20 0952 .  thiamine tablet 100 mg, 100 mg, Per Tube, Daily, Eudelia Bunch, RPH, 100 mg at 08/16/20 0881 Labs CBC    Component Value Date/Time  WBC 7.9 08/16/2020 0811   RBC 4.28 08/16/2020 0811   HGB 12.8 08/16/2020 0811   HCT 40.0 08/16/2020 0811   PLT 162 08/16/2020 0811   MCV 93.5 08/16/2020 0811   MCH 29.9 08/16/2020 0811   MCHC 32.0 08/16/2020 0811   RDW 14.5 08/16/2020 0811   LYMPHSABS 2.3 08/13/2020 2044   MONOABS 0.4 08/13/2020 2044   EOSABS 0.1 08/13/2020 2044   BASOSABS 0.1 08/13/2020 2044    CMP     Component Value Date/Time   NA 146 (H) 08/16/2020 0811   K 3.3 (L) 08/16/2020 0811   CL 114 (H) 08/16/2020 0811   CO2 18 (L)  08/16/2020 0811   GLUCOSE 175 (H) 08/16/2020 0811   BUN 17 08/16/2020 0811   CREATININE 1.95 (H) 08/16/2020 0811   CREATININE 1.56 (H) 07/26/2018 1513   CALCIUM 8.2 (L) 08/16/2020 0811   CALCIUM 8.9 09/13/2014 0703   PROT 5.3 (L) 08/16/2020 0811   ALBUMIN 2.2 (L) 08/16/2020 0811   AST 15 08/16/2020 0811   ALT 9 08/16/2020 0811   ALKPHOS 77 08/16/2020 0811   BILITOT 0.8 08/16/2020 0811   GFRNONAA 30 (L) 08/16/2020 0811   GFRNONAA 37 (L) 07/26/2018 1513   GFRAA 54 (L) 05/09/2019 0956   GFRAA 43 (L) 07/26/2018 1513  CSF cultures negative thus far Blood cultures negative thus far  Imaging/diagnostics I have reviewed images in epic and the results pertinent to this consultation are: MRI exam of the brain with a very small acute/early subacute infarct in the right basal ganglia which is nearly punctate. Scattered FLAIR hyperintensities nonspecific.  Numerous prior bilateral basal ganglia and thalamic microhemorrhages likely sequelae of hypertensive hemorrhages.  Small bilateral lacunar infarcts that also appear remote.   Spot EEG with slowing.  No focality  2D echocardiogram LVEF 50 to 55%, moderate left ventricular hypertrophy.  Left atrial size moderately dilated.  Mitral valve normal.     Assessment:  54 year old past history of polysubstance abuse including cocaine and marijuana, medication noncompliance, and other risk factors including atrial fibrillation not on anticoagulation presenting with fever and altered mental status with concern for seizures on presentation.  Initially she was wiggling her feet on tapping but today she did not perform much-also because of the sedation on board. EEG negative for seizure/status. Noted to have blood sugars in high 700s. Sugars have not been normalized Underwent LP due to no other source of infection and fever.  CSF showed10 white cells and elevated protein-could be secondary to the seizure, secondary to diabetes or due to HSV encephalitis.   Less likely bacterial. Imaging negative for posterior reversible encephalopathy syndrome or reversible cerebral vasoconstriction syndrome which were higher on the differentials given her cocaine use, medication noncompliance etc. Impression: New onset seizure-like activity and Fever of unknown source-concern for HSV encephalitis Hyperglycemia Polysubstance abuse  Recommendations: Continue acyclovir-if the HSV PCR is negative, can discontinue at that point. Can discontinue antibiotics for meningitis coverage - can use for empiric systemic infection coverage per primary team. Continue Keppra Attempt to wean sedation as tolerated Management of DKA per primary team as you are We will reassess her with large sedation and update recommendations. If mentation does not improve, might need transfer to [facility] for continuous EEG. Plan discussed with Dr. Larey Days, PCCM on the unit -- Amie Portland, MD [facility] Pager: [phone] If 7pm to 7am, please call on call as listed on AMION.  CRITICAL CARE ATTESTATION Performed by: Amie Portland, MD Total critical care  time: 33 minutes Critical care time was exclusive of separately billable procedures and treating other patients and/or supervising APPs/Residents/Students Critical care was necessary to treat or prevent imminent or life-threatening deterioration due to altered mental status, fever, concern for encephalitis. This patient is critically ill and at significant risk for neurological worsening and/or death and care requires constant monitoring. Critical care was time spent personally by me on the following activities: development of treatment plan with patient and/or surrogate as well as nursing, discussions with consultants, evaluation of patient's response to treatment, examination of patient, obtaining history from patient or surrogate, ordering and performing treatments and interventions, ordering and review of  laboratory studies, ordering and review of radiographic studies, pulse oximetry, re-evaluation of patient's condition, participation in multidisciplinary rounds and medical decision making of high complexity in the care of this patient.

## 2020-08-16 NOTE — Progress Notes (Signed)
MD Hunsucker notified of 14 beat run of vtach and reported to RN that the potassium should help since hers was low at 3.3

## 2020-08-17 DIAGNOSIS — J9601 Acute respiratory failure with hypoxia: Secondary | ICD-10-CM | POA: Diagnosis not present

## 2020-08-17 DIAGNOSIS — G0490 Encephalitis and encephalomyelitis, unspecified: Secondary | ICD-10-CM | POA: Diagnosis not present

## 2020-08-17 DIAGNOSIS — I4891 Unspecified atrial fibrillation: Secondary | ICD-10-CM | POA: Diagnosis not present

## 2020-08-17 DIAGNOSIS — J9600 Acute respiratory failure, unspecified whether with hypoxia or hypercapnia: Secondary | ICD-10-CM | POA: Diagnosis not present

## 2020-08-17 DIAGNOSIS — N179 Acute kidney failure, unspecified: Secondary | ICD-10-CM | POA: Diagnosis not present

## 2020-08-17 LAB — BASIC METABOLIC PANEL
Anion gap: 12 (ref 5–15)
BUN: 20 mg/dL (ref 6–20)
CO2: 19 mmol/L — ABNORMAL LOW (ref 22–32)
Calcium: 8.5 mg/dL — ABNORMAL LOW (ref 8.9–10.3)
Chloride: 116 mmol/L — ABNORMAL HIGH (ref 98–111)
Creatinine, Ser: 1.86 mg/dL — ABNORMAL HIGH (ref 0.44–1.00)
GFR, Estimated: 31 mL/min — ABNORMAL LOW (ref >60)
Glucose, Bld: 201 mg/dL — ABNORMAL HIGH (ref 70–99)
Potassium: 3.1 mmol/L — ABNORMAL LOW (ref 3.5–5.1)
Sodium: 147 mmol/L — ABNORMAL HIGH (ref 135–145)

## 2020-08-17 LAB — GLUCOSE, CAPILLARY
Glucose-Capillary: 158 mg/dL — ABNORMAL HIGH (ref 70–99)
Glucose-Capillary: 228 mg/dL — ABNORMAL HIGH (ref 70–99)
Glucose-Capillary: 166 mg/dL — ABNORMAL HIGH (ref 70–99)
Glucose-Capillary: 254 mg/dL — ABNORMAL HIGH (ref 70–99)
Glucose-Capillary: 177 mg/dL — ABNORMAL HIGH (ref 70–99)
Glucose-Capillary: 203 mg/dL — ABNORMAL HIGH (ref 70–99)

## 2020-08-17 LAB — CBC
HCT: 41.5 % (ref 36.0–46.0)
Hemoglobin: 13.1 g/dL (ref 12.0–15.0)
MCH: 29.3 pg (ref 26.0–34.0)
MCHC: 31.6 g/dL (ref 30.0–36.0)
MCV: 92.8 fL (ref 80.0–100.0)
Platelets: 162 10*3/uL (ref 150–400)
RBC: 4.47 MIL/uL (ref 3.87–5.11)
RDW: 14.2 % (ref 11.5–15.5)
WBC: 9.3 10*3/uL (ref 4.0–10.5)
nRBC: 0 % (ref 0.0–0.2)

## 2020-08-17 LAB — HSV 1/2 PCR, CSF
HSV-1 DNA: NEGATIVE
HSV-2 DNA: NEGATIVE

## 2020-08-17 LAB — PHOSPHORUS
Phosphorus: 3 mg/dL (ref 2.5–4.6)
Phosphorus: 2.4 mg/dL — ABNORMAL LOW (ref 2.5–4.6)

## 2020-08-17 LAB — MAGNESIUM
Magnesium: 1.8 mg/dL (ref 1.7–2.4)
Magnesium: 1.9 mg/dL (ref 1.7–2.4)

## 2020-08-17 MED ORDER — POTASSIUM CHLORIDE 10 MEQ/100ML IV SOLN
10.0000 meq | INTRAVENOUS | Status: AC
  Administered 2020-08-17 (×2): 10 meq via INTRAVENOUS
  Filled 2020-08-17 (×2): qty 100

## 2020-08-17 MED ORDER — FENTANYL 2500MCG IN NS 250ML (10MCG/ML) PREMIX INFUSION
0.0000 ug/h | INTRAVENOUS | Status: DC
  Administered 2020-08-17: 200 ug/h via INTRAVENOUS
  Administered 2020-08-17: 25 ug/h via INTRAVENOUS
  Administered 2020-08-18: 225 ug/h via INTRAVENOUS
  Administered 2020-08-19: 250 ug/h via INTRAVENOUS
  Administered 2020-08-19: 125 ug/h via INTRAVENOUS
  Filled 2020-08-17 (×6): qty 250

## 2020-08-17 MED ORDER — DEXMEDETOMIDINE HCL IN NACL 400 MCG/100ML IV SOLN
0.4000 ug/kg/h | INTRAVENOUS | Status: DC
  Administered 2020-08-17 – 2020-08-20 (×17): 1.2 ug/kg/h via INTRAVENOUS
  Filled 2020-08-17 (×17): qty 100

## 2020-08-17 MED ORDER — MAGNESIUM SULFATE 2 GM/50ML IV SOLN
2.0000 g | Freq: Once | INTRAVENOUS | Status: AC
  Administered 2020-08-17: 2 g via INTRAVENOUS

## 2020-08-17 MED ORDER — METOPROLOL TARTRATE 25 MG/10 ML ORAL SUSPENSION
25.0000 mg | Freq: Four times a day (QID) | ORAL | Status: DC
  Administered 2020-08-17 – 2020-08-20 (×12): 25 mg
  Filled 2020-08-17 (×14): qty 10

## 2020-08-17 MED ORDER — POTASSIUM CHLORIDE 20 MEQ/15ML (10%) PO SOLN
20.0000 meq | ORAL | Status: AC
  Administered 2020-08-17 (×2): 20 meq
  Filled 2020-08-17 (×2): qty 15

## 2020-08-17 MED ORDER — FENTANYL CITRATE (PF) 100 MCG/2ML IJ SOLN
25.0000 ug | INTRAMUSCULAR | Status: DC | PRN
  Administered 2020-08-17 – 2020-08-18 (×2): 50 ug via INTRAVENOUS
  Administered 2020-08-18 (×2): 100 ug via INTRAVENOUS
  Filled 2020-08-17 (×2): qty 2

## 2020-08-17 MED ORDER — LORAZEPAM 2 MG/ML IJ SOLN
1.0000 mg | INTRAMUSCULAR | Status: DC | PRN
  Administered 2020-08-17 – 2020-08-19 (×5): 1 mg via INTRAVENOUS
  Filled 2020-08-17 (×4): qty 1

## 2020-08-17 MED ORDER — METOPROLOL TARTRATE 5 MG/5ML IV SOLN
INTRAVENOUS | Status: AC
  Filled 2020-08-17: qty 5

## 2020-08-17 MED ORDER — LORAZEPAM 2 MG/ML IJ SOLN
INTRAMUSCULAR | Status: AC
  Filled 2020-08-17: qty 1

## 2020-08-17 MED ORDER — CLONIDINE HCL 0.1 MG PO TABS
0.2000 mg | ORAL_TABLET | Freq: Three times a day (TID) | ORAL | Status: DC
  Administered 2020-08-17 – 2020-08-20 (×9): 0.2 mg
  Filled 2020-08-17 (×9): qty 2

## 2020-08-17 NOTE — Progress Notes (Signed)
[facility] Progress Note Patient Name: [name] DOB: 04-13-1966 MRN: [id number]   Date of Service  08/17/2020  HPI/Events of Note  Patient continues to be very agitated and dyssynchronous of PRN Fentanyl boluses.  eICU Interventions  Fentanyl infusion ordered.        Kerry Kass Savas Elvin 08/17/2020, 5:04 AM

## 2020-08-17 NOTE — Progress Notes (Signed)
Neurology Progress Note   S:// Patient seen and examined. Following more commands than yesterday. Requires sedation otherwise becomes extremely agitated.  On sedation, blood pressures have been soft.   O:// Current vital signs: BP (!) 164/99   Pulse (!) 115   Temp 99.7 F (37.6 C) (Axillary)   Resp 15   Ht 5\' 1"  (1.549 m)   Wt 82.6 kg   LMP 05/19/2016   SpO2 100%   BMI 34.41 kg/m  Vital signs in last 24 hours: Temp:  [98.8 F (37.1 C)-99.7 F (37.6 C)] 99.7 F (37.6 C) (10/23 1217) Pulse Rate:  [61-115] 115 (10/23 1148) Resp:  [15-27] 15 (10/23 1137) BP: (110-183)/(79-119) 164/99 (10/23 1148) SpO2:  [98 %-100 %] 100 % (10/23 1140) FiO2 (%):  [30 %] 30 % (10/23 1140) Neurologic exam Mental status: Received fentanyl prior to examination.  Was awake. Remains intubated Speech and language: Able to nod yes and no to most questions appropriately Follows some commands-was able to close her eyes to command, and raise both arms to command but did not wiggle her toes or move her feet to command. Cranial nerves: Pupils are equal round react light, extraocular movements intact, visual field full, facial symmetry difficult to ascertain Motor exam: Is able to raise both arms somewhat against gravity symmetrically.  Did not move lower extremities volitionally or to noxious stimulation. Sensory exam: As above Coordination and gait cannot be tested  Respiratory: Vented CVS: Irregularly irregular Extremities: Warm well perfused with trace edema   Medications  Current Facility-Administered Medications:  .  metoprolol tartrate (LOPRESSOR) 5 MG/5ML injection, , , ,  .  acyclovir (ZOVIRAX) 600 mg in dextrose 5 % 100 mL IVPB, 600 mg, Intravenous, Q12H, 08-13-1977, RPH, Stopped at 08/17/20 0600 .  apixaban (ELIQUIS) tablet 5 mg, 5 mg, Per Tube, BID, Hunsucker, 08/19/20, MD, 5 mg at 08/17/20 0950 .  chlorhexidine gluconate (MEDLINE KIT) (PERIDEX) 0.12 % solution 15 mL, 15 mL,  Mouth Rinse, BID, Hunsucker, 08/19/20, MD, 15 mL at 08/17/20 0755 .  Chlorhexidine Gluconate Cloth 2 % PADS 6 each, 6 each, Topical, Daily, Hunsucker, 08/19/20, MD, 6 each at 08/17/20 [phone] .  cloNIDine (CATAPRES) tablet 0.2 mg, 0.2 mg, Per Tube, TID, 2616, MD .  dexmedetomidine (PRECEDEX) 400 MCG/100ML (4 mcg/mL) infusion, 0.4-1.2 mcg/kg/hr, Intravenous, Titrated, Hunsucker, Nyoka Cowden, MD, Last Rate: 24.8 mL/hr at 08/17/20 1000, 1.2 mcg/kg/hr at 08/17/20 1000 .  dextrose 50 % solution 0-50 mL, 0-50 mL, Intravenous, PRN, Long, 10-07-1976, MD .  feeding supplement (PROSource TF) liquid 45 mL, 45 mL, Per Tube, BID, Hunsucker, Arlyss Repress, MD, 45 mL at 08/17/20 0950 .  feeding supplement (VITAL AF 1.2 CAL) liquid 1,000 mL, 1,000 mL, Per Tube, Continuous, Hunsucker, 08/19/20, MD, Last Rate: 45 mL/hr at 08/17/20 0627, Rate Change at 08/17/20 0627 .  fentaNYL (SUBLIMAZE) injection 25-100 mcg, 25-100 mcg, Intravenous, Q2H PRN, 08/19/20, MD, 50 mcg at 08/17/20 0417 .  fentaNYL 08/19/20 in NS (56mcg/ml) infusion-PREMIX, 0-400 mcg/hr, Intravenous, Continuous, Ogan, Okoronkwo U, MD, Last Rate: 10 mL/hr at 08/17/20 1000, 100 mcg/hr at 08/17/20 1000 .  free water 200 mL, 200 mL, Per Tube, Q6H, Hunsucker, 08/19/20, MD, 200 mL at 08/17/20 1206 .  insulin aspart (novoLOG) injection 0-15 Units, 0-15 Units, Subcutaneous, Q4H, Shah, Pratik D, DO, 3 Units at 08/17/20 1205 .  insulin glargine (LANTUS) injection 10 Units, 10 Units, Subcutaneous, QHS, Hunsucker, 08/19/20, MD, 10 Units at  08/16/20 2100 .  lactated ringers infusion, , Intravenous, Continuous, Long, Wonda Olds, MD, Last Rate: 125 mL/hr at 08/17/20 1326, New Bag at 08/17/20 1326 .  levETIRAcetam (KEPPRA) 100 MG/ML solution 750 mg, 750 mg, Per Tube, BID, Amie Portland, MD, 750 mg at 08/17/20 0950 .  MEDLINE mouth rinse, 15 mL, Mouth Rinse, 10 times per day, Hunsucker, Bonna Gains, MD, 15 mL at 08/17/20 1328 .  metoprolol tartrate (LOPRESSOR)  25 mg/10 mL oral suspension 12.5 mg, 12.5 mg, Per Tube, Q6H, Hunsucker, Bonna Gains, MD, 12.5 mg at 08/17/20 1201 .  pantoprazole sodium (PROTONIX) 40 mg/20 mL oral suspension 40 mg, 40 mg, Per Tube, Daily, Eudelia Bunch, RPH, 40 mg at 08/17/20 0951 .  thiamine tablet 100 mg, 100 mg, Per Tube, Daily, Eudelia Bunch, RPH, 100 mg at 08/17/20 0950 Labs CBC    Component Value Date/Time   WBC 9.3 08/17/2020 0253   RBC 4.47 08/17/2020 0253   HGB 13.1 08/17/2020 0253   HCT 41.5 08/17/2020 0253   PLT 162 08/17/2020 0253   MCV 92.8 08/17/2020 0253   MCH 29.3 08/17/2020 0253   MCHC 31.6 08/17/2020 0253   RDW 14.2 08/17/2020 0253   LYMPHSABS 2.3 08/13/2020 2044   MONOABS 0.4 08/13/2020 2044   EOSABS 0.1 08/13/2020 2044   BASOSABS 0.1 08/13/2020 2044    CMP     Component Value Date/Time   NA 147 (H) 08/17/2020 0253   K 3.1 (L) 08/17/2020 0253   CL 116 (H) 08/17/2020 0253   CO2 19 (L) 08/17/2020 0253   GLUCOSE 201 (H) 08/17/2020 0253   BUN 20 08/17/2020 0253   CREATININE 1.86 (H) 08/17/2020 0253   CREATININE 1.56 (H) 07/26/2018 1513   CALCIUM 8.5 (L) 08/17/2020 0253   CALCIUM 8.9 09/13/2014 0703   PROT 5.3 (L) 08/16/2020 0811   ALBUMIN 2.2 (L) 08/16/2020 0811   AST 15 08/16/2020 0811   ALT 9 08/16/2020 0811   ALKPHOS 77 08/16/2020 0811   BILITOT 0.8 08/16/2020 0811   GFRNONAA 31 (L) 08/17/2020 0253   GFRNONAA 37 (L) 07/26/2018 1513   GFRAA 54 (L) 05/09/2019 0956   GFRAA 43 (L) 07/26/2018 1513  CSF cultures negative thus far Blood cultures negative thus far HSV PCR pending  Imaging/diagnostics I have reviewed images in epic and the results pertinent to this consultation are: MRI exam of the brain with a very small acute/early subacute infarct in the right basal ganglia which is nearly punctate. Scattered FLAIR hyperintensities nonspecific.  Numerous prior bilateral basal ganglia and thalamic microhemorrhages likely sequelae of hypertensive hemorrhages.  Small bilateral  lacunar infarcts that also appear remote.   Spot EEG with slowing.  No focality  2D echocardiogram LVEF 50 to 55%, moderate left ventricular hypertrophy.  Left atrial size moderately dilated.  Mitral valve normal.     Assessment:  54 year old past history of polysubstance abuse including cocaine and marijuana, medication noncompliance, and other risk factors including atrial fibrillation not on anticoagulation presenting with fever and altered mental status with concern for seizures on presentation.  More awake today than yesterday.  Follows most simple commands that she did yesterday. EEG negative for seizure/status. Noted to have blood sugars in high 700s on admission-normalized now. Underwent LP due to no other source of infection and fever.  CSF showed10 white cells and elevated protein-could be secondary to the seizure, secondary to diabetes or due to HSV encephalitis.  Less likely bacterial.  HSV PCR pending. Imaging negative for posterior reversible encephalopathy  syndrome or reversible cerebral vasoconstriction syndrome which were higher on the differentials given her cocaine use, medication noncompliance etc.  Impression: New onset seizure-like activity and Fever of unknown source-concern for HSV encephalitis Hyperglycemia Polysubstance abuse  Recommendations: Continue acyclovir-if the HSV PCR is negative, can discontinue at that point. Continue Keppra Attempt to wean sedation as tolerated Management of DKA per primary team as you are Do not see a need for LTM as she is following commands.  Plan discussed with Dr.  Melvyn Novas, PCCM on the unit -- Amie Portland, MD [facility] Pager: [phone] If 7pm to 7am, please call on call as listed on AMION.  CRITICAL CARE ATTESTATION Performed by: Amie Portland, MD Total critical care time: 33 minutes Critical care time was exclusive of separately billable procedures and treating other patients and/or supervising  APPs/Residents/Students Critical care was necessary to treat or prevent imminent or life-threatening deterioration due to altered mental status, fever, concern for encephalitis. This patient is critically ill and at significant risk for neurological worsening and/or death and care requires constant monitoring. Critical care was time spent personally by me on the following activities: development of treatment plan with patient and/or surrogate as well as nursing, discussions with consultants, evaluation of patient's response to treatment, examination of patient, obtaining history from patient or surrogate, ordering and performing treatments and interventions, ordering and review of laboratory studies, ordering and review of radiographic studies, pulse oximetry, re-evaluation of patient's condition, participation in multidisciplinary rounds and medical decision making of high complexity in the care of this patient.

## 2020-08-17 NOTE — Progress Notes (Signed)
[facility] Progress Note Patient Name: [name] DOB: 10/03/1966 MRN: [id number]   Date of Service  08/17/2020  HPI/Events of Note  K+ 3.1, Mg+ 1.8.  eICU Interventions  Adult electrolyte replacement protocol orders for K+ and Mg+ entered.        Kerry Kass Bao Bazen 08/17/2020, 7:06 AM

## 2020-08-17 NOTE — Progress Notes (Signed)
0400- E-link notified of increased agitation, restlessness, attempting to remove ETT. precedex at max rate, prn fentanyl  Fentanyl ordered.  0500- E-link notified of no result from prn fentanyl. Fentanyl drip ordered.  77 E-link notified of intermittent arrhythmias occurring throughout the shift, with increasing frequency the last 2 hours. EKG resulted with sinus rhythm with marked sinus arrhythmia. Also notified of abnormal am labs including potassium level 3.1, sodium level 147.

## 2020-08-17 NOTE — Progress Notes (Signed)
Pt HR spiked to 201 after sedation titrated down. RRT flipped pt back to full support on vent, fentanyl was increased. Pt was wide eyed and appeared to be panicked during the event. HR resolved shortly after fentanyl was increased. Pt is now in normal sinus to low sinus tach

## 2020-08-17 NOTE — Progress Notes (Signed)
NAME:  [name], MRN:  [id number], DOB:  Mar 12, 1966, LOS: 4 ADMISSION DATE:  08/13/2020, CONSULTATION DATE:  08/14/20 REFERRING MD:  edp, CHIEF COMPLAINT:  Acute resp    Brief History   54 yobf smoker with HBP no known sz disorder to ER Pm 10/20 with ams then sz with w/u pos for AG met acidosis/ fever to 102 ? Source and rec to trx to [facility] when bed available / PCCM service at [facility] asked to consult awaiting trx.   History of present illness    53 y.o. female   presented to the emergency department 8:30 pm  after being found unresponsive by her boyfriend.  He last saw her well earlier in the evening of 10/19.  He states he went to get something to eat and then came back and found her unresponsive.  EMS arrived to find the patient with seizure activity which was intermittent.  They established IV access and transported her immediately to the emergency department.  She had nonrebreather on arrival.  She would awaken to pain but not provide any verbal response.  Unknown history of seizure.  Boyfriend and family are unsure of the patient's exact medical history. Pt started gm sz on arrival and was intubated and placed on diprovan drip with labs c/w Met acidosis ? dka > CT head neg and arranged for tx to [facility] pm 10/20 with no furhter sz and temp to 102 s source identified.  Past Medical History   Alcohol use  Anxiety Chronic anemia Chronic combined systolic and diastolic CHF (congestive heart failure) (HCC)  CKD (chronic kidney disease), stage III (HCC)  Cocaine abuse (HCC) COPD (chronic obstructive pulmonary disease) ([place]) Diabetes mellitus, type 2 ([place])  Diabetic Charcot foot ([place])  Essential hypertension  Morbid obesity ([place]) (01/31/2008)  Noncompliance with medication regimen PAT (paroxysmal atrial tachycardia) (HCC) Sleep apnea Stroke ([place]), Tobacco abuse Urinary incontinence.    Significant Hospital Events   Afib 10/19 pm > amiodarone rx acutely, d/c'd in ER   Consults:    Neurology  10/19 PCCM        10/20   Procedures:    Significant Diagnostic Tests:  CT head 10/19  Neg UDS   10/19  Pos cocaine, etoh  < 10  MRI  10/21  1. Small acute/early subacute infarct within the right basal ganglia 2. Scattered T2/FLAIR hyperintensities within the white matter, most likely secondary to age advanced chronic microvascular ischemic disease given clinical risk factors. 3. Numerous bilateral prior basal ganglia and thalamic microhemorrhages with additional larger area of prior hemorrhage in the right frontal lobe. While nonspecific, the distribution suggests prior hypertensive hemorrhages. 4. Small remote bilateral cerebellar lacunar infarcts.   Micro Data:  U/A   10/19  Neg leuk/nitrites  Covid 19  PCR   10/19   Neg Flu  A/B    PCR    10/19   Neg  BC x 2  10/19 >>> BC x 2  10/20 >>> MRSA  PCR 10/20 >>>neg  LP   10/21 >  Wbc's no org seen >>>   Antimicrobials:  Cefepime  10/19 >>> 10/20 Flagyl  10/19 >>> 10/20 Vanc    10/19 >> 10/20 Rocephin 10/20 > 10/21  Ampicillin 10/20>> Acyclovir 10/20  >>   Scheduled Meds: . metoprolol tartrate      . apixaban  5 mg Per Tube BID  . chlorhexidine gluconate (MEDLINE KIT)  15 mL Mouth Rinse BID  . Chlorhexidine Gluconate Cloth  6 each  Topical Daily  . cloNIDine  0.2 mg Per Tube TID  . feeding supplement (PROSource TF)  45 mL Per Tube BID  . free water  200 mL Per Tube Q6H  . insulin aspart  0-15 Units Subcutaneous Q4H  . insulin glargine  10 Units Subcutaneous QHS  . levETIRAcetam  750 mg Per Tube BID  . mouth rinse  15 mL Mouth Rinse 10 times per day  . metoprolol tartrate  12.5 mg Per Tube Q6H  . pantoprazole sodium  40 mg Per Tube Daily  . thiamine  100 mg Per Tube Daily   Continuous Infusions: . acyclovir Stopped (08/17/20 0600)  . dexmedetomidine (PRECEDEX) IV infusion 1.2 mcg/kg/hr (08/17/20 1000)  . feeding supplement (VITAL AF 1.2 CAL) 45 mL/hr at 08/17/20 0627  . fentaNYL infusion INTRAVENOUS  100 mcg/hr (08/17/20 1000)  . lactated ringers 125 mL/hr at 08/17/20 1326   PRN Meds:.dextrose, fentaNYL (SUBLIMAZE) injection   Interim history/subjective:  initermittently agitated, hypertensive and one short run Afib with RVR spont converted back to sr with sedation   Objective   Blood pressure (!) 164/99, pulse (!) 115, temperature 99.7 F (37.6 C), temperature source Axillary, resp. rate 15, height '5\' 1"'  (1.549 m), weight 82.6 kg, last menstrual period 05/19/2016, SpO2 100 %.    Vent Mode: PRVC FiO2 (%):  [30 %] 30 % Set Rate:  [12 bmp] 12 bmp Vt Set:  [380 mL] 380 mL PEEP:  [5 cmH20] 5 cmH20 Pressure Support:  [10 cmH20] 10 cmH20 Plateau Pressure:  [16 cmH20-18 cmH20] 16 cmH20   Intake/Output Summary (Last 24 hours) at 08/17/2020 1550 Last data filed at 08/17/2020 1000 Gross per 24 hour  Intake 2773.61 ml  Output 1250 ml  Net 1523.61 ml   Filed Weights   08/14/20 1300  Weight: 82.6 kg    Examination: Tmax 99.7  Pt beginning to follow some simple commands but easily agitated when sedation reduced Oropharynx et in place, big tongue Neck supple Lungs with a few scattered exp > insp rhonchi bilaterally RRR no s3 or or sign murmur Abd obese with nl  excursion  Extr warm with no edema or clubbing noted Neuro   no apparent motor deficits        Resolved Hospital Problem list      Assessment & Plan:  Acute resp failure in setting of new onset sz/ mild dka - minimal vent settings - VAP protocol - will need to be more controlled in terms of agitation before attempt extubation ? Am 10/24    New onset SZ  Assoc with hyperglycemia, acute R basal ganglia stroke  - keppra - BG control - discussed progress wit hDr Rory Percy this am   Toxic Metabolic encephalopathy: Multifactorial and related to stroke, seizure, hyperglycemia, possible viral encephalitis. -Vent as above -LP with 10 wbcs, glucose ok, awaiting HSV/VZV PCR -Continue acyclovir per neuro/ per  flowsheet  Atrial fibrillation  - NSR on metop - holding anticoagulation for now, resume Eliquis 10/22 PM  Lactic acidosis: Due to seizure, AG resolved   AKI: Lab Results  Component Value Date   CREATININE 1.86 (H) 08/17/2020   CREATININE 1.95 (H) 08/16/2020   CREATININE 1.82 (H) 08/15/2020    Cr elevated, in setting of critical illness. Suspect hypovolemia as not getting TFs. Possible progression of CKD in setting of drug use, HTN.      Best practice:  Diet: TFs Pain/Anxiety/Delirium protocol (if indicated): precedex, prn fentanyl  VAP protocol (if indicated): per protocol DVT  prophylaxis: Eliquis GI prophylaxis: ppi Glucose control: SSI Mobility: sbr Code Status: full code Family Communication:  Brother updated 10/21 Disposition: ICU  Labs   CBC: Recent Labs  Lab 08/13/20 2044 08/15/20 0647 08/16/20 0811 08/17/20 0253  WBC 10.9* 8.5 7.9 9.3  NEUTROABS 8.0*  --   --   --   HGB 15.0 13.8 12.8 13.1  HCT 47.1* 43.0 40.0 41.5  MCV 92.5 92.5 93.5 92.8  PLT 241 174 162 948    Basic Metabolic Panel: Recent Labs  Lab 08/14/20 0253 08/14/20 0253 08/14/20 0618 08/15/20 0320 08/15/20 0647 08/16/20 0811 08/16/20 1430 08/16/20 1636 08/17/20 0253  NA 140  --  141 140  --  146*  --   --  147*  K 3.1*  --  3.0* 5.7*  --  3.3*  --   --  3.1*  CL 105  --  104 115*  --  114*  --   --  116*  CO2 24  --  23 14*  --  18*  --   --  19*  GLUCOSE 392*  --  233* 93  --  175*  --   --  201*  BUN 14  --  15 17  --  17  --   --  20  CREATININE 1.69*   < > 1.66* 1.93* 1.82* 1.95*  --   --  1.86*  CALCIUM 8.3*  --  8.4* 7.9*  --  8.2*  --   --  8.5*  MG  --   --  1.7  --   --   --  1.7 1.6* 1.8  PHOS  --   --   --   --   --   --  3.2 3.2 3.0   < > = values in this interval not displayed.   GFR: Estimated Creatinine Clearance: 32.9 mL/min (A) (by C-G formula based on SCr of 1.86 mg/dL (H)). Recent Labs  Lab 08/13/20 2044 08/14/20 0002 08/14/20 0940 08/14/20 1223  08/15/20 0647 08/16/20 0301 08/16/20 0811 08/17/20 0253  PROCALCITON  --   --   --  0.15  --  0.10  --   --   WBC 10.9*  --   --   --  8.5  --  7.9 9.3  LATICACIDVEN 10.1* 5.2* 3.0* 2.5*  --   --   --   --     Liver Function Tests: Recent Labs  Lab 08/13/20 2044 08/16/20 0811  AST 19 15  ALT 16 9  ALKPHOS 112 77  BILITOT 0.9 0.8  PROT 6.9 5.3*  ALBUMIN 3.1* 2.2*   No results for input(s): LIPASE, AMYLASE in the last 168 hours. Recent Labs  Lab 08/16/20 1430  AMMONIA 37*    ABG    Component Value Date/Time   PHART 7.348 (L) 08/13/2020 2256   PCO2ART 46.0 08/13/2020 2256   PO2ART 205 (H) 08/13/2020 2256   HCO3 23.8 08/13/2020 2256   TCO2 17 09/11/2014 1152   ACIDBASEDEF 0.3 08/13/2020 2256   O2SAT 99.9 08/13/2020 2256     Coagulation Profile: Recent Labs  Lab 08/13/20 2044  INR 1.0    Cardiac Enzymes: No results for input(s): CKTOTAL, CKMB, CKMBINDEX, TROPONINI in the last 168 hours.  HbA1C: Hgb A1c MFr Bld  Date/Time Value Ref Range Status  08/14/2020 12:23 PM 13.8 (H) 4.8 - 5.6 % Final    Comment:    (NOTE)         Prediabetes:  5.7 - 6.4         Diabetes: >6.4         Glycemic control for adults with diabetes: <7.0   05/09/2019 09:56 AM 8.3 (H) 4.8 - 5.6 % Final    Comment:    (NOTE) Pre diabetes:          5.7%-6.4% Diabetes:              >6.4% Glycemic control for   <7.0% adults with diabetes     CBG: Recent Labs  Lab 08/16/20 2010 08/16/20 2340 08/17/20 0355 08/17/20 0749 08/17/20 1142  GLUCAP 254* 265* 158* 228* 166*       The patient is critically ill with multiple organ systems failure and requires high complexity decision making for assessment and support, frequent evaluation and titration of therapies, application of advanced monitoring technologies and extensive interpretation of multiple databases. Critical Care Time devoted to patient care services described in this note is 35 minutes.    Christinia Gully, MD Pulmonary and  [place] [phone]   After 7:00 pm call Elink  [phone]

## 2020-08-17 NOTE — Progress Notes (Signed)
[facility] Progress Note Patient Name: [name] DOB: Mar 24, 1966 MRN: [id number]   Date of Service  08/17/2020  HPI/Events of Note  Patient needs a PRN fentanyl order to optimize sedation on the ventilator.  eICU Interventions  PRN Fentanyl order entered.        Kerry Kass Lashunda Greis 08/17/2020, 4:04 AM

## 2020-08-18 DIAGNOSIS — N179 Acute kidney failure, unspecified: Secondary | ICD-10-CM | POA: Diagnosis not present

## 2020-08-18 DIAGNOSIS — J9600 Acute respiratory failure, unspecified whether with hypoxia or hypercapnia: Secondary | ICD-10-CM | POA: Diagnosis not present

## 2020-08-18 DIAGNOSIS — J9601 Acute respiratory failure with hypoxia: Secondary | ICD-10-CM | POA: Diagnosis not present

## 2020-08-18 DIAGNOSIS — G0490 Encephalitis and encephalomyelitis, unspecified: Secondary | ICD-10-CM | POA: Diagnosis not present

## 2020-08-18 DIAGNOSIS — I4891 Unspecified atrial fibrillation: Secondary | ICD-10-CM | POA: Diagnosis not present

## 2020-08-18 LAB — GLUCOSE, CAPILLARY
Glucose-Capillary: 117 mg/dL — ABNORMAL HIGH (ref 70–99)
Glucose-Capillary: 168 mg/dL — ABNORMAL HIGH (ref 70–99)
Glucose-Capillary: 211 mg/dL — ABNORMAL HIGH (ref 70–99)
Glucose-Capillary: 297 mg/dL — ABNORMAL HIGH (ref 70–99)
Glucose-Capillary: 348 mg/dL — ABNORMAL HIGH (ref 70–99)
Glucose-Capillary: 371 mg/dL — ABNORMAL HIGH (ref 70–99)
Glucose-Capillary: 58 mg/dL — ABNORMAL LOW (ref 70–99)
Glucose-Capillary: 87 mg/dL (ref 70–99)

## 2020-08-18 MED ORDER — HYDRALAZINE HCL 20 MG/ML IJ SOLN
10.0000 mg | Freq: Four times a day (QID) | INTRAMUSCULAR | Status: DC | PRN
  Administered 2020-08-18: 10 mg via INTRAVENOUS
  Administered 2020-08-21 – 2020-08-24 (×4): 20 mg via INTRAVENOUS
  Administered 2020-08-24: 10 mg via INTRAVENOUS
  Filled 2020-08-18 (×6): qty 1

## 2020-08-18 MED ORDER — FUROSEMIDE 10 MG/ML IJ SOLN
40.0000 mg | Freq: Once | INTRAMUSCULAR | Status: AC
  Administered 2020-08-18: 40 mg via INTRAVENOUS
  Filled 2020-08-18: qty 4

## 2020-08-18 NOTE — Progress Notes (Addendum)
Neurology Progress Note   S:// Patient seen and examined. She was on fentanyl, Precedex and had been given Ativan few minutes prior to my examination as she was getting very agitated. Prior to that, according to the RN, she was following commands.  She has been requiring sedation also for her vitals being extremely unstable off of sedation.   O:// Current vital signs: BP (!) 189/118 (BP Location: Right Arm)   Pulse 78   Temp 99.5 F (37.5 C) (Oral)   Resp 12   Ht '5\' 1"'  (1.549 m)   Wt 98.5 kg   LMP 05/19/2016   SpO2 97%   BMI 41.03 kg/m  Vital signs in last 24 hours: Temp:  [99.5 F (37.5 C)-100.8 F (38.2 C)] 99.5 F (37.5 C) (10/24 0832) Pulse Rate:  [46-80] 78 (10/24 0900) Resp:  [9-15] 12 (10/24 0900) BP: (125-244)/(82-173) 189/118 (10/24 0900) SpO2:  [95 %-100 %] 97 % (10/24 0900) FiO2 (%):  [30 %] 30 % (10/24 1210) Weight:  [98.5 kg] 98.5 kg (10/24 0500) Neurologic exam Mental status: Received Ativan prior to examination.  Very difficult to arouse, did not open eyes to voice or noxious stimulation. Remains intubated. Cranial nerves: Pupils are round reactive to light, has intact corneals. Motor exam: Could not get any movement in any of the extremities to noxious stimulation-had just received Ativan and is currently also on Precedex and fentanyl.  I did hold the Precedex for the exam but it was still extremely poor Sensory exam: As above Coordination and gait cannot be tested  Respiratory: Vented CVS: Irregularly irregular Extremities: Warm well perfused with trace edema   Medications  Current Facility-Administered Medications:  .  apixaban (ELIQUIS) tablet 5 mg, 5 mg, Per Tube, BID, Hunsucker, Bonna Gains, MD, 5 mg at 08/18/20 0919 .  chlorhexidine gluconate (MEDLINE KIT) (PERIDEX) 0.12 % solution 15 mL, 15 mL, Mouth Rinse, BID, Hunsucker, Bonna Gains, MD, 15 mL at 08/18/20 0801 .  Chlorhexidine Gluconate Cloth 2 % PADS 6 each, 6 each, Topical, Daily, Hunsucker,  Bonna Gains, MD, 6 each at 08/18/20 [phone] .  cloNIDine (CATAPRES) tablet 0.2 mg, 0.2 mg, Per Tube, TID, Tanda Rockers, MD, 0.2 mg at 08/18/20 0919 .  dexmedetomidine (PRECEDEX) 400 MCG/100ML (4 mcg/mL) infusion, 0.4-1.2 mcg/kg/hr, Intravenous, Titrated, Hunsucker, Bonna Gains, MD, Last Rate: 24.8 mL/hr at 08/18/20 0955, 1.2 mcg/kg/hr at 08/18/20 0955 .  dextrose 50 % solution 0-50 mL, 0-50 mL, Intravenous, PRN, Long, Wonda Olds, MD, 50 mL at 08/18/20 0424 .  feeding supplement (PROSource TF) liquid 45 mL, 45 mL, Per Tube, BID, Hunsucker, Bonna Gains, MD, 45 mL at 08/18/20 0918 .  feeding supplement (VITAL AF 1.2 CAL) liquid 1,000 mL, 1,000 mL, Per Tube, Continuous, Hunsucker, Bonna Gains, MD, Last Rate: 45 mL/hr at 08/18/20 0000, Restarted at 08/18/20 0000 .  fentaNYL (SUBLIMAZE) injection 25-100 mcg, 25-100 mcg, Intravenous, Q2H PRN, Frederik Pear, MD, 100 mcg at 08/18/20 0835 .  fentaNYL 2543mg in NS 2567m(1040mml) infusion-PREMIX, 0-400 mcg/hr, Intravenous, Continuous, Ogan, Okoronkwo U, MD, Last Rate: 22.5 mL/hr at 08/18/20 1146, 225 mcg/hr at 08/18/20 1146 .  free water 200 mL, 200 mL, Per Tube, Q6H, Hunsucker, MatBonna GainsD, 200 mL at 08/18/20 1143 .  furosemide (LASIX) injection 40 mg, 40 mg, Intravenous, Once, WerChristinia Gully MD .  insulin aspart (novoLOG) injection 0-15 Units, 0-15 Units, Subcutaneous, Q4H, Shah, Pratik D, DO, 5 Units at 08/18/20 1216 .  insulin glargine (LANTUS) injection 10 Units, 10 Units, Subcutaneous,  Wayna Chalet, Bonna Gains, MD, 10 Units at 08/17/20 2100 .  lactated ringers infusion, , Intravenous, Continuous, Tanda Rockers, MD, Last Rate: 125 mL/hr at 08/18/20 0615, Rate Verify at 08/18/20 0615 .  levETIRAcetam (KEPPRA) 100 MG/ML solution 750 mg, 750 mg, Per Tube, BID, Amie Portland, MD, 750 mg at 08/18/20 0919 .  LORazepam (ATIVAN) injection 1 mg, 1 mg, Intravenous, Q4H PRN, Tanda Rockers, MD, 1 mg at 08/18/20 1015 .  MEDLINE mouth rinse, 15 mL, Mouth Rinse, 10  times per day, Hunsucker, Bonna Gains, MD, 15 mL at 08/18/20 1143 .  metoprolol tartrate (LOPRESSOR) 25 mg/10 mL oral suspension 25 mg, 25 mg, Per Tube, Q6H, Tanda Rockers, MD, 25 mg at 08/18/20 1143 .  pantoprazole sodium (PROTONIX) 40 mg/20 mL oral suspension 40 mg, 40 mg, Per Tube, Daily, Eudelia Bunch, RPH, 40 mg at 08/18/20 0920 Labs CBC    Component Value Date/Time   WBC 9.3 08/17/2020 0253   RBC 4.47 08/17/2020 0253   HGB 13.1 08/17/2020 0253   HCT 41.5 08/17/2020 0253   PLT 162 08/17/2020 0253   MCV 92.8 08/17/2020 0253   MCH 29.3 08/17/2020 0253   MCHC 31.6 08/17/2020 0253   RDW 14.2 08/17/2020 0253   LYMPHSABS 2.3 08/13/2020 2044   MONOABS 0.4 08/13/2020 2044   EOSABS 0.1 08/13/2020 2044   BASOSABS 0.1 08/13/2020 2044    CMP     Component Value Date/Time   NA 147 (H) 08/17/2020 0253   K 3.1 (L) 08/17/2020 0253   CL 116 (H) 08/17/2020 0253   CO2 19 (L) 08/17/2020 0253   GLUCOSE 201 (H) 08/17/2020 0253   BUN 20 08/17/2020 0253   CREATININE 1.86 (H) 08/17/2020 0253   CREATININE 1.56 (H) 07/26/2018 1513   CALCIUM 8.5 (L) 08/17/2020 0253   CALCIUM 8.9 09/13/2014 0703   PROT 5.3 (L) 08/16/2020 0811   ALBUMIN 2.2 (L) 08/16/2020 0811   AST 15 08/16/2020 0811   ALT 9 08/16/2020 0811   ALKPHOS 77 08/16/2020 0811   BILITOT 0.8 08/16/2020 0811   GFRNONAA 31 (L) 08/17/2020 0253   GFRNONAA 37 (L) 07/26/2018 1513   GFRAA 54 (L) 05/09/2019 0956   GFRAA 43 (L) 07/26/2018 1513  CSF cultures negative thus far Blood cultures negative thus far HSV PCR pending  Imaging/diagnostics I have reviewed images in epic and the results pertinent to this consultation are: MRI exam of the brain with a very small acute/early subacute infarct in the right basal ganglia which is nearly punctate. Scattered FLAIR hyperintensities nonspecific.  Numerous prior bilateral basal ganglia and thalamic microhemorrhages likely sequelae of hypertensive hemorrhages.  Small bilateral lacunar infarcts  that also appear remote.   Spot EEG with slowing.  No focality  2D echocardiogram LVEF 50 to 55%, moderate left ventricular hypertrophy.  Left atrial size moderately dilated.  Mitral valve normal.     Assessment:  54 year old past history of polysubstance abuse including cocaine and marijuana, medication noncompliance, and other risk factors including atrial fibrillation not on anticoagulation presenting with fever and altered mental status with concern for seizures on presentation.  More awake today than yesterday.  Follows most simple commands that she did yesterday. EEG negative for seizure/status. Noted to have blood sugars in high 700s on admission-normalized now. Underwent LP due to no other source of infection and fever.  CSF showed10 white cells and elevated protein-differentials included secondary to the seizure, secondary to diabetes or due to HSV encephalitis.  Less likely bacterial.  HSV PCR came back negative and acyclovir was discontinued this morning. Imaging negative for posterior reversible encephalopathy syndrome or reversible cerebral vasoconstriction syndrome which were higher on the differentials given her cocaine use, medication noncompliance etc. Suspect multifactorial toxic metabolic encephalopathy and will require time and supportive care for her to return to her normal mentation. My examination was very limited and she had just received bolus of sedation and was on ongoing sedation as well.  I also question if there is a component of withdrawal from alcohol and substances which might be causing her autonomic instability.  Impression: New onset seizure-like activity and Fever of unknown source-HSV encephalitis not likely HSV PCR is negative. Hyperglycemia Polysubstance abuse-  ?Withdrawal  Recommendations: Discontinue acyclovir Continue Keppra Attempt to wean sedation as tolerated Management of DKA per primary team as you are If her mentation worsens and she  is at a point not following commands, will consider repeat EEG. We will follow her again with you tomorrow-probably will have to hold sedation for a longer duration prior to examination.  I will sign out to my oncoming partner to coordinate this with the ICU. Of note: clinical exam can lag improvement after lab improvement.  -- Amie Portland, MD [facility] Pager: [phone] If 7pm to 7am, please call on call as listed on AMION.

## 2020-08-18 NOTE — Progress Notes (Addendum)
Throughout the day, pt continued to have increased BP regardless of scheduled BP medications. Increased BP and HR typically correlated with increased agitation. Sedation increased back to previous dose. MD aware and sedation bolus given. MD also ordered PRN hydralazine.   Educated family on the importance of keeping a calm environment.

## 2020-08-18 NOTE — Progress Notes (Signed)
HSV 1 and 2 PCR negative in CSF. Can discontinue acyclovir.  Amie Portland, MD Neurology

## 2020-08-18 NOTE — Progress Notes (Signed)
NAME:  [name], MRN:  [id number], DOB:  09-24-1966, LOS: 5 ADMISSION DATE:  08/13/2020, CONSULTATION DATE:  08/14/20 REFERRING MD:  edp, CHIEF COMPLAINT:  AMS then sz Fransisca Connors failure  Brief History   21 yobf smoker with HBP no known sz disorder to ER Pm 10/20 with ams then sz with w/u pos for AG met acidosis/ fever to 102 ? Source and rec to trx to [facility] when bed available / PCCM service at [facility] asked to consult awaiting trx.   History of present illness    54 y.o. female   presented to the emergency department 8:30 pm  after being found unresponsive by her boyfriend.  He last saw her well earlier in the evening of 10/19.  He states he went to get something to eat and then came back and found her unresponsive.  EMS arrived to find the patient with seizure activity which was intermittent.  They established IV access and transported her immediately to the emergency department.  She had nonrebreather on arrival.  She would awaken to pain but not provide any verbal response.  Unknown history of seizure.  Boyfriend and family are unsure of the patient's exact medical history. Pt started gm sz on arrival and was intubated and placed on diprovan drip with labs c/w Met acidosis ? dka > CT head neg and arranged for tx to [facility] pm 10/20 with no furhter sz and temp to 102 s source identified.  Past Medical History   Alcohol use  Anxiety Chronic anemia Chronic combined systolic and diastolic CHF (congestive heart failure) (HCC)  CKD (chronic kidney disease), stage III (HCC)  Cocaine abuse (HCC) COPD (chronic obstructive pulmonary disease) ([place]) Diabetes mellitus, type 2 ([place])  Diabetic Charcot foot ([place])  Essential hypertension  Morbid obesity ([place]) (01/31/2008)  Noncompliance with medication regimen PAT (paroxysmal atrial tachycardia) (HCC) Sleep apnea Stroke ([place]), Tobacco abuse Urinary incontinence.    Significant Hospital Events   Afib 10/19 pm > amiodarone rx acutely, d/c'd in ER  10/24  changed to PCV to see if more synchronous and less sedation needed  Consults:  Neurology  10/19 PCCM        10/20   Procedures:    Significant Diagnostic Tests:  CT head 10/19  Neg UDS   10/19  Pos cocaine, etoh  < 10  MRI  10/21  1. Small acute/early subacute infarct within the right basal ganglia 2. Scattered T2/FLAIR hyperintensities within the white matter, most likely secondary to age advanced chronic microvascular ischemic disease given clinical risk factors. 3. Numerous bilateral prior basal ganglia and thalamic microhemorrhages with additional larger area of prior hemorrhage in the right frontal lobe. While nonspecific, the distribution suggests prior hypertensive hemorrhages. 4. Small remote bilateral cerebellar lacunar infarcts.   Micro Data:  U/A   10/19  Neg leuk/nitrites  Covid 19  PCR   10/19   Neg Flu  A/B    PCR    10/19   Neg  BC x 2  10/19 >>> BC x 2  10/20 >>> MRSA  PCR 10/20 >>>neg  LP   10/21 >  Wbc's no org seen >>>   Antimicrobials:  Cefepime  10/19 >>> 10/20 Flagyl  10/19 >>> 10/20 Vanc    10/19 >> 10/20 Rocephin 10/20 > 10/21  Ampicillin 10/20 >>>10/21 Acyclovir 10/20  >>>   Scheduled Meds: . apixaban  5 mg Per Tube BID  . chlorhexidine gluconate (MEDLINE KIT)  15 mL Mouth Rinse BID  .  Chlorhexidine Gluconate Cloth  6 each Topical Daily  . cloNIDine  0.2 mg Per Tube TID  . feeding supplement (PROSource TF)  45 mL Per Tube BID  . free water  200 mL Per Tube Q6H  . insulin aspart  0-15 Units Subcutaneous Q4H  . insulin glargine  10 Units Subcutaneous QHS  . levETIRAcetam  750 mg Per Tube BID  . mouth rinse  15 mL Mouth Rinse 10 times per day  . metoprolol tartrate  25 mg Per Tube Q6H  . pantoprazole sodium  40 mg Per Tube Daily  . thiamine  100 mg Per Tube Daily   Continuous Infusions: . dexmedetomidine (PRECEDEX) IV infusion 1.2 mcg/kg/hr (08/18/20 0955)  . feeding supplement (VITAL AF 1.2 CAL) 45 mL/hr at 08/18/20 0000  . fentaNYL  infusion INTRAVENOUS 225 mcg/hr (08/18/20 1146)  . lactated ringers 125 mL/hr at 08/18/20 0615   PRN Meds:.dextrose, fentaNYL (SUBLIMAZE) injection, LORazepam   Interim history/subjective:  Needing more sedation to control agitation and hbp ? If related to very low VT (ARDS protocol which is not needed here)   Objective   Blood pressure (!) 189/118, pulse 78, temperature 99.5 F (37.5 C), temperature source Oral, resp. rate 12, height '5\' 1"'  (1.549 m), weight 98.5 kg, last menstrual period 05/19/2016, SpO2 97 %.    Vent Mode: PRVC FiO2 (%):  [30 %] 30 % Set Rate:  [12 bmp] 12 bmp Vt Set:  [380 mL] 380 mL PEEP:  [5 cmH20] 5 cmH20 Plateau Pressure:  [13 cmH20-19 cmH20] 14 cmH20   Intake/Output Summary (Last 24 hours) at 08/18/2020 1244 Last data filed at 08/18/2020 1142 Gross per 24 hour  Intake 3999.42 ml  Output 3075 ml  Net 924.42 ml   Filed Weights   08/14/20 1300 08/17/20 0500 08/18/20 0500  Weight: 82.6 kg 98.5 kg 98.5 kg    Examination: Tmax  100.8 trending up a bit off abx Pt sedated No jvd Oropharynx oral ET, big tongue noted  Neck supple Lungs with a few scattered exp > insp rhonchi bilaterally RRR no s3 or or sign murmur Abd obese with limited excursion  Extr warm with no edema or clubbing noted        Resolved Hospital Problem list      Assessment & Plan:  Acute resp failure in setting of new onset sz/ mild dka  - VAP protocol - will need to be more controlled in terms of agitation before attempt extubation  > try change to PCV to see if can improve vent synchrony   New onset SZ  Assoc with hyperglycemia, acute R basal ganglia stroke  - keppra - BG control - discussed progress wit hDr Rory Percy 34/19   Toxic Metabolic encephalopathy: Multifactorial and related to stroke, seizure, hyperglycemia, possible viral encephalitis. -Vent as above -LP with 10 wbcs, glucose ok, awaiting HSV/VZV PCR -Continue acyclovir per neuro/ per flowsheet  Atrial  fibrillation  - NSR on metop - resumed  Eliquis 10/22 PM  Lactic acidosis: Due to seizure, AG resolved   AKI: Lab Results  Component Value Date   CREATININE 1.86 (H) 08/17/2020   CREATININE 1.95 (H) 08/16/2020   CREATININE 1.82 (H) 08/15/2020  Cr elevated, in setting of critical illness.  Marland Kitchen Possible progression of CKD in setting of drug use, HTN.    HBP  - requiring clonidine, lopressor, and sedation as of 08/13/2020  rec  Try diuresis / change to PCV for synchrony   Best practice:  Diet: TFs Pain/Anxiety/Delirium  protocol (if indicated): precedex, prn fentanyl  VAP protocol (if indicated): per protocol DVT prophylaxis: Eliquis GI prophylaxis: ppi Glucose control: SSI Mobility: sbr Code Status: full code Family Communication:  Brother updated 10/21 Disposition: ICU  Labs   CBC: Recent Labs  Lab 08/13/20 2044 08/15/20 0647 08/16/20 0811 08/17/20 0253  WBC 10.9* 8.5 7.9 9.3  NEUTROABS 8.0*  --   --   --   HGB 15.0 13.8 12.8 13.1  HCT 47.1* 43.0 40.0 41.5  MCV 92.5 92.5 93.5 92.8  PLT 241 174 162 638    Basic Metabolic Panel: Recent Labs  Lab 08/14/20 0253 08/14/20 0253 08/14/20 0618 08/15/20 0320 08/15/20 0647 08/16/20 0811 08/16/20 1430 08/16/20 1636 08/17/20 0253 08/17/20 1656  NA 140  --  141 140  --  146*  --   --  147*  --   K 3.1*  --  3.0* 5.7*  --  3.3*  --   --  3.1*  --   CL 105  --  104 115*  --  114*  --   --  116*  --   CO2 24  --  23 14*  --  18*  --   --  19*  --   GLUCOSE 392*  --  233* 93  --  175*  --   --  201*  --   BUN 14  --  15 17  --  17  --   --  20  --   CREATININE 1.69*   < > 1.66* 1.93* 1.82* 1.95*  --   --  1.86*  --   CALCIUM 8.3*  --  8.4* 7.9*  --  8.2*  --   --  8.5*  --   MG  --   --  1.7  --   --   --  1.7 1.6* 1.8 1.9  PHOS  --   --   --   --   --   --  3.2 3.2 3.0 2.4*   < > = values in this interval not displayed.   GFR: Estimated Creatinine Clearance: 36.3 mL/min (A) (by C-G formula based on SCr of 1.86  mg/dL (H)). Recent Labs  Lab 08/13/20 2044 08/14/20 0002 08/14/20 0940 08/14/20 1223 08/15/20 0647 08/16/20 0301 08/16/20 0811 08/17/20 0253  PROCALCITON  --   --   --  0.15  --  0.10  --   --   WBC 10.9*  --   --   --  8.5  --  7.9 9.3  LATICACIDVEN 10.1* 5.2* 3.0* 2.5*  --   --   --   --     Liver Function Tests: Recent Labs  Lab 08/13/20 2044 08/16/20 0811  AST 19 15  ALT 16 9  ALKPHOS 112 77  BILITOT 0.9 0.8  PROT 6.9 5.3*  ALBUMIN 3.1* 2.2*   No results for input(s): LIPASE, AMYLASE in the last 168 hours. Recent Labs  Lab 08/16/20 1430  AMMONIA 37*    ABG    Component Value Date/Time   PHART 7.348 (L) 08/13/2020 2256   PCO2ART 46.0 08/13/2020 2256   PO2ART 205 (H) 08/13/2020 2256   HCO3 23.8 08/13/2020 2256   TCO2 17 09/11/2014 1152   ACIDBASEDEF 0.3 08/13/2020 2256   O2SAT 99.9 08/13/2020 2256     Coagulation Profile: Recent Labs  Lab 08/13/20 2044  INR 1.0    Cardiac Enzymes: No results for input(s): CKTOTAL, CKMB, CKMBINDEX, TROPONINI in the last  168 hours.  HbA1C: Hgb A1c MFr Bld  Date/Time Value Ref Range Status  08/14/2020 12:23 PM 13.8 (H) 4.8 - 5.6 % Final    Comment:    (NOTE)         Prediabetes: 5.7 - 6.4         Diabetes: >6.4         Glycemic control for adults with diabetes: <7.0   05/09/2019 09:56 AM 8.3 (H) 4.8 - 5.6 % Final    Comment:    (NOTE) Pre diabetes:          5.7%-6.4% Diabetes:              >6.4% Glycemic control for   <7.0% adults with diabetes     CBG: Recent Labs  Lab 08/18/20 0003 08/18/20 0414 08/18/20 0509 08/18/20 0759 08/18/20 1210  GLUCAP 87 58* 117* 168* 211*      The patient is critically ill with multiple organ systems failure and requires high complexity decision making for assessment and support, frequent evaluation and titration of therapies, application of advanced monitoring technologies and extensive interpretation of multiple databases. Critical Care Time devoted to patient care  services described in this note is 35 minutes.    Christinia Gully, MD [facility] [place] [phone]   After 7:00 pm call Elink  [phone]

## 2020-08-18 NOTE — Progress Notes (Signed)
Precedex and Fentanyl drips decreased by half for daily awakening trial.

## 2020-08-19 ENCOUNTER — Inpatient Hospital Stay (HOSPITAL_COMMUNITY)
Admit: 2020-08-19 | Discharge: 2020-08-19 | Disposition: A | Discharge status: Home / Self Care | Payer: Medicare Other | Attending: Neurology | Admitting: Neurology

## 2020-08-19 ENCOUNTER — Inpatient Hospital Stay (HOSPITAL_COMMUNITY): Payer: Medicare Other

## 2020-08-19 DIAGNOSIS — I4891 Unspecified atrial fibrillation: Secondary | ICD-10-CM | POA: Diagnosis not present

## 2020-08-19 DIAGNOSIS — J9600 Acute respiratory failure, unspecified whether with hypoxia or hypercapnia: Secondary | ICD-10-CM | POA: Diagnosis not present

## 2020-08-19 DIAGNOSIS — R739 Hyperglycemia, unspecified: Secondary | ICD-10-CM

## 2020-08-19 DIAGNOSIS — G40901 Epilepsy, unspecified, not intractable, with status epilepticus: Secondary | ICD-10-CM | POA: Diagnosis not present

## 2020-08-19 DIAGNOSIS — G9341 Metabolic encephalopathy: Secondary | ICD-10-CM

## 2020-08-19 DIAGNOSIS — G0490 Encephalitis and encephalomyelitis, unspecified: Secondary | ICD-10-CM | POA: Diagnosis not present

## 2020-08-19 DIAGNOSIS — N179 Acute kidney failure, unspecified: Secondary | ICD-10-CM | POA: Diagnosis not present

## 2020-08-19 DIAGNOSIS — G40409 Other generalized epilepsy and epileptic syndromes, not intractable, without status epilepticus: Secondary | ICD-10-CM | POA: Diagnosis not present

## 2020-08-19 LAB — CSF CULTURE W GRAM STAIN
Culture: NO GROWTH
Gram Stain: NONE SEEN

## 2020-08-19 LAB — GLUCOSE, CAPILLARY
Glucose-Capillary: 177 mg/dL — ABNORMAL HIGH (ref 70–99)
Glucose-Capillary: 209 mg/dL — ABNORMAL HIGH (ref 70–99)
Glucose-Capillary: 220 mg/dL — ABNORMAL HIGH (ref 70–99)
Glucose-Capillary: 261 mg/dL — ABNORMAL HIGH (ref 70–99)
Glucose-Capillary: 273 mg/dL — ABNORMAL HIGH (ref 70–99)
Glucose-Capillary: 275 mg/dL — ABNORMAL HIGH (ref 70–99)
Glucose-Capillary: 317 mg/dL — ABNORMAL HIGH (ref 70–99)

## 2020-08-19 LAB — BASIC METABOLIC PANEL
Anion gap: 10 (ref 5–15)
BUN: 22 mg/dL — ABNORMAL HIGH (ref 6–20)
CO2: 20 mmol/L — ABNORMAL LOW (ref 22–32)
Calcium: 8.4 mg/dL — ABNORMAL LOW (ref 8.9–10.3)
Chloride: 114 mmol/L — ABNORMAL HIGH (ref 98–111)
Creatinine, Ser: 1.61 mg/dL — ABNORMAL HIGH (ref 0.44–1.00)
GFR, Estimated: 37 mL/min — ABNORMAL LOW (ref >60)
Glucose, Bld: 303 mg/dL — ABNORMAL HIGH (ref 70–99)
Potassium: 3.1 mmol/L — ABNORMAL LOW (ref 3.5–5.1)
Sodium: 144 mmol/L (ref 135–145)

## 2020-08-19 LAB — CBC
HCT: 43.1 % (ref 36.0–46.0)
Hemoglobin: 13.4 g/dL (ref 12.0–15.0)
MCH: 29.7 pg (ref 26.0–34.0)
MCHC: 31.1 g/dL (ref 30.0–36.0)
MCV: 95.6 fL (ref 80.0–100.0)
Platelets: 179 10*3/uL (ref 150–400)
RBC: 4.51 MIL/uL (ref 3.87–5.11)
RDW: 13.8 % (ref 11.5–15.5)
WBC: 10.7 10*3/uL — ABNORMAL HIGH (ref 4.0–10.5)
nRBC: 0 % (ref 0.0–0.2)

## 2020-08-19 LAB — CULTURE, BLOOD (ROUTINE X 2)
Culture: NO GROWTH
Culture: NO GROWTH

## 2020-08-19 MED ORDER — POTASSIUM CHLORIDE 20 MEQ/15ML (10%) PO SOLN
40.0000 meq | ORAL | Status: AC
  Administered 2020-08-19 (×2): 40 meq
  Filled 2020-08-19 (×2): qty 30

## 2020-08-19 MED ORDER — ACETAMINOPHEN 160 MG/5ML PO SOLN
650.0000 mg | Freq: Once | ORAL | Status: AC
  Administered 2020-08-19: 650 mg
  Filled 2020-08-19: qty 20.3

## 2020-08-19 MED ORDER — INSULIN ASPART 100 UNIT/ML ~~LOC~~ SOLN
5.0000 [IU] | SUBCUTANEOUS | Status: DC
  Administered 2020-08-19 – 2020-08-20 (×7): 5 [IU] via SUBCUTANEOUS

## 2020-08-19 MED ORDER — FREE WATER
250.0000 mL | Freq: Four times a day (QID) | Status: DC
  Administered 2020-08-19 – 2020-08-22 (×7): 250 mL

## 2020-08-19 MED ORDER — LINAGLIPTIN 5 MG PO TABS
5.0000 mg | ORAL_TABLET | Freq: Every day | ORAL | Status: DC
  Administered 2020-08-19 – 2020-08-26 (×5): 5 mg
  Filled 2020-08-19 (×5): qty 1

## 2020-08-19 MED ORDER — AMLODIPINE BESYLATE 10 MG PO TABS
10.0000 mg | ORAL_TABLET | Freq: Every day | ORAL | Status: DC
  Administered 2020-08-19 – 2020-08-26 (×5): 10 mg
  Filled 2020-08-19 (×6): qty 1

## 2020-08-19 MED ORDER — FUROSEMIDE 10 MG/ML IJ SOLN
40.0000 mg | Freq: Once | INTRAMUSCULAR | Status: AC
  Administered 2020-08-19: 40 mg via INTRAVENOUS
  Filled 2020-08-19: qty 4

## 2020-08-19 MED ORDER — GABAPENTIN 250 MG/5ML PO SOLN
300.0000 mg | Freq: Three times a day (TID) | ORAL | Status: DC
  Administered 2020-08-19 – 2020-08-27 (×10): 300 mg
  Filled 2020-08-19 (×27): qty 6

## 2020-08-19 MED ORDER — INSULIN GLARGINE 100 UNIT/ML ~~LOC~~ SOLN
15.0000 [IU] | Freq: Two times a day (BID) | SUBCUTANEOUS | Status: DC
  Administered 2020-08-19 – 2020-08-22 (×6): 15 [IU] via SUBCUTANEOUS
  Filled 2020-08-19 (×8): qty 0.15

## 2020-08-19 MED ORDER — TRAZODONE HCL 50 MG PO TABS
50.0000 mg | ORAL_TABLET | Freq: Every day | ORAL | Status: DC
  Administered 2020-08-19 – 2020-08-26 (×4): 50 mg
  Filled 2020-08-19 (×4): qty 1

## 2020-08-19 MED ORDER — POTASSIUM CHLORIDE 10 MEQ/100ML IV SOLN
10.0000 meq | INTRAVENOUS | Status: AC
  Administered 2020-08-19 (×3): 10 meq via INTRAVENOUS
  Filled 2020-08-19 (×3): qty 100

## 2020-08-19 NOTE — Progress Notes (Addendum)
NEUROLOGY PROGRESS NOTE  Subjective: Patient seen. She remains on fentanyl, Precedex.  Weaning off of Precedex at this point time. PCCM NP stated she was following commands for him however she would not follow commands for me. Currently remains tachycardic at 120 bpm.  Exam: Vitals:   08/19/20 0748 08/19/20 0821  BP:    Pulse:    Resp:    Temp:  99.3 F (37.4 C)  SpO2: 100%       Neuro:  Mental Status: Eyes open, clench his eyes shut when attempting to look at pupils, nurse at bedside attempting to brush teeth patient moving head back and forth.  Remains intubated.  Not following verbal commands Cranial Nerves: II: Blinks to threat bilaterally III,IV, VI: Eyes deviated to the left and could not get to cross over to the right with doll's maneuver.  Pupils 1 mm.  Attempts to clench eyes shut while attempting to see pupils V,VII: Winces to noxious stimuli on face Motor: To noxious stimuli patient was not moving extremities however she did wince.  While watching patient during mouth care she did move bilateral legs but again did not notice any arm movement. Sensory: Winces to noxious stimuli Deep Tendon Reflexes: 2+ upper extremity Plantars: Right: downgoing   Left: downgoing   Medications:  Scheduled: . apixaban  5 mg Per Tube BID  . chlorhexidine gluconate (MEDLINE KIT)  15 mL Mouth Rinse BID  . Chlorhexidine Gluconate Cloth  6 each Topical Daily  . cloNIDine  0.2 mg Per Tube TID  . feeding supplement (PROSource TF)  45 mL Per Tube BID  . free water  250 mL Per Tube Q6H  . gabapentin  300 mg Per Tube Q8H  . insulin aspart  0-15 Units Subcutaneous Q4H  . insulin aspart  5 Units Subcutaneous Q4H  . insulin glargine  15 Units Subcutaneous BID  . levETIRAcetam  750 mg Per Tube BID  . mouth rinse  15 mL Mouth Rinse 10 times per day  . metoprolol tartrate  25 mg Per Tube Q6H  . pantoprazole sodium  40 mg Per Tube Daily  . potassium chloride  40 mEq Per Tube Q4H  .  traZODone  50 mg Per Tube QHS   Continuous: . dexmedetomidine (PRECEDEX) IV infusion 1.2 mcg/kg/hr (08/19/20 0900)  . feeding supplement (VITAL AF 1.2 CAL) 1,000 mL (08/18/20 1546)  . fentaNYL infusion INTRAVENOUS 125 mcg/hr (08/19/20 0900)  . lactated ringers Stopped (08/19/20 0855)  . potassium chloride 100 mL/hr at 08/19/20 0900   PRN:dextrose, fentaNYL (SUBLIMAZE) injection, hydrALAZINE, LORazepam  Pertinent Labs/Diagnostics: As stated prior MRI exam of the brain with a very small acute/early subacute infarct in the right basal ganglia which is nearly punctate.  Scattered FLAIR hyperintensities nonspecific.  Numerous prior bilateral basal ganglia and thalamic microhemorrhages likely sequela of hypertensive hemorrhages.  Small bilateral lacunar infarcts that appear remote.  - EEG with slowing but no focality  - 2D echo shows left ventricular ejection fraction of 50 to 55% moderate left ventricle hypertrophy.  Left atrial size moderately dilated.  - Initial glucose on 10/19 was 772 which has been brought down to 177 today 10/25  DG Chest Port 1 View  Result Date: 08/19/2020 CLINICAL DATA:  Intubation EXAM: PORTABLE CHEST 1 VIEW COMPARISON:  Six days ago FINDINGS: Endotracheal tube with tip 18 mm above the carina. The enteric tube reaches the stomach. Cardiomegaly and vascular pedicle widening. Low volume chest with mild streaky density. No Kerley lines, effusion, or pneumothorax. IMPRESSION:   Stable hardware positioning, cardiomegaly, and atelectasis. Electronically Signed   By: Jonathon  Watts M.D.   On: 08/19/2020 05:53     Assessment: 54-year-old past history of polysubstance abuse including cocaine and marijuana, medication noncompliance, and other risk factors including atrial fibrillation not on anticoagulation presenting with fever and altered mental status with concern for seizures on presentation.    Per previous note, Initially she was wiggling her feet on tapping but today she  did not perform much-possibly because of the sedation on board.  Today's exam shows patient clearly not wanting mouth care, wincing to pain, moving lower extremities however her gaze is slightly deviated to the left which I cannot get to cross midline with doll's maneuver.  That said she is on Precedex and fentanyl which is being weaned off. EEG negative for seizure/status. Noted to have blood sugars in high 700s initially however now has been decreased to 170 Sugars have not been normalized Underwent LP due to no other source of infection and fever.  CSF showed10 white cells and elevated protein-could be secondary to the seizure, secondary to diabetes or due to HSV encephalitis.  Less likely bacterial. Imaging negative for posterior reversible encephalopathy syndrome or reversible cerebral vasoconstriction syndrome which were higher on the differentials given her cocaine use, medication noncompliance etc.  Impression: -New onset seizure-likely activity and fever of unknown source/hypoglycemia/polysubstance abuse  Recommendations: -Continue to normalize blood glucose -Continue to wean off of Precedex -Repeat EEG -Continue Keppra 750 mg twice daily -We will continue to follow   David Smith PA-C [facility] [phone] 08/19/2020, 9:30 AM  I have seen the patient reviewed the above note.  Persistent encephalopathy in the setting of multiple metabolic issues.  The mild elevation in WBC is of uncertain significance and can be seen in the setting of seizure, and acyclovir has been discontinued now that HSV PCR is negative.  We will repeat her EEG today and continue Keppra.   , MD [facility] [phone]  If 7pm- 7am, please page neurology on call as listed in AMION.  

## 2020-08-19 NOTE — Progress Notes (Addendum)
NAME:  CLARITY CISZEK, MRN:  [id number], DOB:  04/28/1966, LOS: 6 ADMISSION DATE:  08/13/2020, CONSULTATION DATE:  08/14/20 REFERRING MD:  edp, CHIEF COMPLAINT:  AMS then sz Fransisca Connors failure  Brief History   9 yobf smoker with HBP no known sz disorder to ER Pm 10/20 with ams then sz with w/u pos for AG met acidosis/ fever to 102 ? Source and rec to trx to [facility] when bed available / PCCM service at [facility] asked to consult awaiting trx.   Past Medical History   Alcohol use  Anxiety Chronic anemia Chronic combined systolic and diastolic CHF (congestive heart failure) (HCC)  CKD (chronic kidney disease), stage III (HCC)  Cocaine abuse (HCC) COPD (chronic obstructive pulmonary disease) ([place]) Diabetes mellitus, type 2 ([place])  Diabetic Charcot foot ([place])  Essential hypertension  Morbid obesity ([place]) (01/31/2008)  Noncompliance with medication regimen PAT (paroxysmal atrial tachycardia) (HCC) Sleep apnea Stroke ([place]), Tobacco abuse Urinary incontinence.    Significant Hospital Events   Afib 10/19 pm > amiodarone rx acutely, d/c'd in ER  10/24 changed to PCV to see if more synchronous and less sedation needed 10/25 adjusting glycemic control. Adding home meds back. Looks comfortable on PCV. Weaning sedation w/ goal to start PSV. Tongue swelling preventing extubation  Consults:  Neurology  10/19 PCCM        10/20   Procedures:    Significant Diagnostic Tests:  CT head 10/19  Neg UDS   10/19  Pos cocaine, etoh  < 10  MRI  10/21  1. Small acute/early subacute infarct within the right basal ganglia 2. Scattered T2/FLAIR hyperintensities within the white matter, most likely secondary to age advanced chronic microvascular ischemic disease given clinical risk factors. 3. Numerous bilateral prior basal ganglia and thalamic microhemorrhages with additional larger area of prior hemorrhage in the right frontal lobe. While nonspecific, the distribution suggests prior hypertensive  hemorrhages. 4. Small remote bilateral cerebellar lacunar infarcts.   Micro Data:  U/A   10/19  Neg leuk/nitrites  Covid 19  PCR   10/19   Neg Flu  A/B    PCR    10/19   Neg  BC x 2  10/19 >>> BC x 2  10/20 >>> MRSA  PCR 10/20 >>>neg  LP   10/21 >  Wbc's no org seen >>>neg    Antimicrobials:  Cefepime  10/19 >>> 10/20 Flagyl  10/19 >>> 10/20 Vanc    10/19 >> 10/20 Rocephin 10/20 > 10/21  Ampicillin 10/20 >>>10/21 Acyclovir 10/20  >>>HSV neg    Interim history/subjective:   54 year old obese female currently sedated on pressure control Infusions of Precedex at 1.2 mcg/h, and fentanyl at 125 mcg/h. Objective   Blood pressure 114/83, pulse 66, temperature 98.1 F (36.7 C), temperature source Oral, resp. rate 16, height '5\' 1"'  (1.549 m), weight 99.7 kg, last menstrual period 05/19/2016, SpO2 100 %.    Vent Mode: PCV FiO2 (%):  [30 %] 30 % Set Rate:  [12 bmp-16 bmp] 16 bmp Vt Set:  [380 mL] 380 mL PEEP:  [5 cmH20] 5 cmH20 Plateau Pressure:  [14 cmH20-21 cmH20] 21 cmH20   Intake/Output Summary (Last 24 hours) at 08/19/2020 0757 Last data filed at 08/18/2020 1858 Gross per 24 hour  Intake 2265.34 ml  Output 1025 ml  Net 1240.34 ml   Filed Weights   08/17/20 0500 08/18/20 0500 08/19/20 0600  Weight: 98.5 kg 98.5 kg 99.7 kg    Examination: General 54 year old female sedated on both  fentanyl and Precedex infusions HEENT normocephalic atraumatic tongue is protruding, does not appear to have angioedema, looks more like resolving trauma perhaps from seizure.  Endotracheal tube unremarkable no JVD Pulmonary: Scattered rhonchi bilaterally equal chest rise.  Currently pressure control 20 PEEP 5, current tidal volumes in the 420 range with minute ventilation less than 7 saturations 100% on 30% FiO2 Cardiac: Regular rate and rhythm normal sinus, currently heart rate in the 60s Abdomen: Soft nontender no organomegaly Extremities: Warm dry brisk capillary refill, there is trace  lower extremity edema Neuro: Sedated as mentioned above.  She will open her eyes, she will follow commands.  Resolved Hospital Problem list   Lactic acidosis secondary to seizures with associated anion gap metabolic acidosis.  This is resolved  Assessment & Plan:  Acute resp failure in setting of new onset sz/ mild dka  -Portable chest x-ray personally reviewed: Endotracheal tube in satisfactory position, could be pulled back possibly 1 cm.  Cardiomegaly, low volume, elevated right hemidiaphragm.  Mild atelectasis -Sounds as though primary barrier to progression has been agitated delirium -Her tongue appears swollen, it somewhat edematous and protrudes from the mouth with difficulty in oral secretion management some of this may have been some traumatic injury from previous seizure but there is no lesions noted.  Plan Continue to titrate sedating medications for RASS goal of zero to -1 Initiate pressure support attempts VAP bundle A.m. chest x-ray  New onset SZ  Assoc with hyperglycemia, acute R basal ganglia stroke  -Last EEG was negative Plan Keppra  Toxic Metabolic encephalopathy: Multifactorial and related to stroke, seizure, hyperglycemia, also consider possibility of withdrawal -LP with 10 wbcs, glucose ok, acyclovir was discontinued after HSV PCR was negative Plan  Continue Keppra Wean sedating meds as able Metabolic support avoiding hyperglycemia Additional recommendations per neurology Resume Cymbalta (when can take PO) also resume neurontin as can wd from this  Resume HS trazedone Hopefully w/ resuming some of these home meds we can stop fent gtt  Atrial fibrillation  NSR on metop - resumed  Eliquis 10/22 PM Plan Continue telemetry monitoring Continue beta-blockade   AKI:May be element of CKD at baseline.  Serum creatinine slowly improving, looks like peaked at 1.95, now down to 1.96. Plan Blood pressure control Avoid nephrotoxins Strict intake output A.m.  chemistry.  Fluid electrolyte balance: Hypokalemia, hyperchloremia, metabolic acidosis/nonanion gap Plan Replace potassium Keep euvolemic A.m. chemistry  Hypertension Plan Continue clonidine, Lopressor and diuretics as BUN and creatinine tolerate  Diabetes with hyperglycemia.  Glucose was in 700s on admission, likely element of DKA, -Her glycemic control is still poor Plan Increase Lantus to 15 units twice daily Add tube feed coverage at 5 units every 4 hours Best practice:  Diet: TFs Pain/Anxiety/Delirium protocol (if indicated): precedex, prn fentanyl  VAP protocol (if indicated): per protocol DVT prophylaxis: Eliquis GI prophylaxis: ppi Glucose control: SSI Mobility: sbr Code Status: full code Family Communication:  Brother updated 10/21 Disposition: ICU  My cct 34 minutes.   Erick Colace ACNP-BC [place] Pager # [phone] OR # [phone] if no answer

## 2020-08-19 NOTE — Progress Notes (Signed)
EEG complete - results pending 

## 2020-08-19 NOTE — Procedures (Signed)
Patient Name: [name]  MRN: [id number]  Epilepsy Attending: Lora Havens  Referring Physician/Provider: Etta Quill, PA Date: 08/19/2020 Duration: 27.01 mins  Patient history:  54 year old female with history of epilepsy who had a breakthrough seizure, in the setting of DKA with blood glucose in the 700 range.  EEG to evaluate for seizures.  Level of alertness: Comatose  AEDs during EEG study: Gabapentin, Keppra  Technical aspects: This EEG study was done with scalp electrodes positioned according to the 10-20 International system of electrode placement. Electrical activity was acquired at a sampling rate of 500Hz  and reviewed with a high frequency filter of 70Hz  and a low frequency filter of 1Hz . EEG data were recorded continuously and digitally stored.   Description: EEG showed continuous generalized 3 to 6 Hz theta-delta slowing.  EEG was reactive to noxious stimulation. Hyperventilation and photic stimulation were not performed.     ABNORMALITY -Continuous slow, generalized  IMPRESSION: This study is suggestive of severe diffuse encephalopathy, nonspecific etiology.  No seizures or epileptiform discharges were seen throughout the recording.   Jakerria Kingbird Barbra Sarks

## 2020-08-19 NOTE — Progress Notes (Signed)
Inpatient Diabetes Program Recommendations  AACE/ADA: New Consensus Statement on Inpatient Glycemic Control (2015)  Target Ranges:  Prepandial:   less than 140 mg/dL      Peak postprandial:   less than 180 mg/dL (1-2 hours)      Critically ill patients:  140 - 180 mg/dL   Results for ARORA, COAKLEY (MRN [id number]) as of 08/19/2020 07:35  Ref. Range 08/18/2020 00:03 08/18/2020 04:14 08/18/2020 05:09 08/18/2020 07:59 08/18/2020 12:10 08/18/2020 16:49 08/18/2020 20:36 08/18/2020 20:38  Glucose-Capillary Latest Ref Range: 70 - 99 mg/dL 87  Tube Feeds were on hold from 8pm until 12am 58 (L)   117 (H) 168 (H)  3 units NOVOLOG  211 (H)  5 units NOVOLOG  297 (H)  8 units NOVOLOG  348 (H) 371 (H)  15 units NOVOLOG +  10 units LANTUS   Results for RONELL, BOLDIN (MRN [id number]) as of 08/19/2020 07:35  Ref. Range 08/19/2020 00:03 08/19/2020 05:24  Glucose-Capillary Latest Ref Range: 70 - 99 mg/dL 317 (H)  11 units NOVOLOG  220 (H)  5 units NOVOLOG    Results for SHYENNE, MAGGARD (MRN [id number]) as of 08/19/2020 07:35  Ref. Range 08/14/2020 12:23  Hemoglobin A1C Latest Ref Range: 4.8 - 5.6 % 13.8 (H)  (349 mg/dl)    Admit with: Acute resp failure in setting of new onset seizures/ Mild DKA/ New onset Seizures Assoc with hyperglycemia, acute R basal ganglia stroke   History: DM, CHF, CKD, Cocaine Abuse, COPD  Home DM Meds: Tradjenta 5 mg Daily  Current Orders: Lantus 10 units QHS      Novolog Moderate Correction Scale/ SSI (0-15 units) Q4 hours    Tube Feeds running 45cc/hr  Remains on Vent     MD- Please consider the following:  1. Change/Increase Lantus to 8 units BID (0.15 units/kg)  2. Start Novolog Tube Feed Coverage: Novolog 4 units Q4 hours  HOLD if tube feeds HELD for any reason    --Will follow patient during hospitalization--  Wyn Quaker RN, MSN, CDE Diabetes Coordinator Inpatient Glycemic Control Team Team Pager: [phone]  (8a-5p)
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
## 2020-08-19 NOTE — Progress Notes (Signed)
[place] Progress Note Patient Name: TUCKER STEEDLEY DOB: 1966/04/29 MRN: [id number]   Date of Service  08/19/2020  HPI/Events of Note  K at 3.1, Cr 1.6 Cocaine abuse.   eICU Interventions  Kcl 10 meq q1 hr intravenously  x 3 doses ordered     Intervention Category Intermediate Interventions: Electrolyte abnormality - evaluation and management  Elmer Sow 08/19/2020, 5:47 AM

## 2020-08-20 DIAGNOSIS — N179 Acute kidney failure, unspecified: Secondary | ICD-10-CM | POA: Diagnosis not present

## 2020-08-20 DIAGNOSIS — I4891 Unspecified atrial fibrillation: Secondary | ICD-10-CM | POA: Diagnosis not present

## 2020-08-20 DIAGNOSIS — J9600 Acute respiratory failure, unspecified whether with hypoxia or hypercapnia: Secondary | ICD-10-CM | POA: Diagnosis not present

## 2020-08-20 DIAGNOSIS — G40409 Other generalized epilepsy and epileptic syndromes, not intractable, without status epilepticus: Secondary | ICD-10-CM | POA: Diagnosis not present

## 2020-08-20 LAB — COMPREHENSIVE METABOLIC PANEL
ALT: 7 U/L (ref 0–44)
AST: 8 U/L — ABNORMAL LOW (ref 15–41)
Albumin: 1.8 g/dL — ABNORMAL LOW (ref 3.5–5.0)
Alkaline Phosphatase: 56 U/L (ref 38–126)
Anion gap: 11 (ref 5–15)
BUN: 26 mg/dL — ABNORMAL HIGH (ref 6–20)
CO2: 20 mmol/L — ABNORMAL LOW (ref 22–32)
Calcium: 8.6 mg/dL — ABNORMAL LOW (ref 8.9–10.3)
Chloride: 113 mmol/L — ABNORMAL HIGH (ref 98–111)
Creatinine, Ser: 1.7 mg/dL — ABNORMAL HIGH (ref 0.44–1.00)
GFR, Estimated: 35 mL/min — ABNORMAL LOW (ref >60)
Glucose, Bld: 263 mg/dL — ABNORMAL HIGH (ref 70–99)
Potassium: 4.2 mmol/L (ref 3.5–5.1)
Sodium: 144 mmol/L (ref 135–145)
Total Bilirubin: 0.5 mg/dL (ref 0.3–1.2)
Total Protein: 5.2 g/dL — ABNORMAL LOW (ref 6.5–8.1)

## 2020-08-20 LAB — GLUCOSE, CAPILLARY
Glucose-Capillary: 100 mg/dL — ABNORMAL HIGH (ref 70–99)
Glucose-Capillary: 110 mg/dL — ABNORMAL HIGH (ref 70–99)
Glucose-Capillary: 119 mg/dL — ABNORMAL HIGH (ref 70–99)
Glucose-Capillary: 175 mg/dL — ABNORMAL HIGH (ref 70–99)
Glucose-Capillary: 229 mg/dL — ABNORMAL HIGH (ref 70–99)
Glucose-Capillary: 95 mg/dL (ref 70–99)

## 2020-08-20 LAB — CBC
HCT: 42 % (ref 36.0–46.0)
Hemoglobin: 13 g/dL (ref 12.0–15.0)
MCH: 29.5 pg (ref 26.0–34.0)
MCHC: 31 g/dL (ref 30.0–36.0)
MCV: 95.5 fL (ref 80.0–100.0)
Platelets: 148 10*3/uL — ABNORMAL LOW (ref 150–400)
RBC: 4.4 MIL/uL (ref 3.87–5.11)
RDW: 14 % (ref 11.5–15.5)
WBC: 9.8 10*3/uL (ref 4.0–10.5)
nRBC: 0 % (ref 0.0–0.2)

## 2020-08-20 LAB — ANAEROBIC CULTURE

## 2020-08-20 MED ORDER — INSULIN ASPART 100 UNIT/ML ~~LOC~~ SOLN
8.0000 [IU] | SUBCUTANEOUS | Status: DC
  Administered 2020-08-20: 8 [IU] via SUBCUTANEOUS

## 2020-08-20 MED ORDER — PANTOPRAZOLE SODIUM 40 MG IV SOLR
40.0000 mg | INTRAVENOUS | Status: DC
  Administered 2020-08-21 – 2020-08-28 (×8): 40 mg via INTRAVENOUS
  Filled 2020-08-20 (×7): qty 40

## 2020-08-20 MED ORDER — PROSOURCE TF PO LIQD
45.0000 mL | Freq: Two times a day (BID) | ORAL | Status: DC
  Administered 2020-08-21 – 2020-08-27 (×3): 45 mL
  Filled 2020-08-20 (×6): qty 45

## 2020-08-20 MED ORDER — HEPARIN (PORCINE) 25000 UT/250ML-% IV SOLN
1000.0000 [IU]/h | INTRAVENOUS | Status: AC
  Administered 2020-08-20: 1000 [IU]/h via INTRAVENOUS
  Filled 2020-08-20: qty 250

## 2020-08-20 MED ORDER — INSULIN ASPART 100 UNIT/ML ~~LOC~~ SOLN
0.0000 [IU] | SUBCUTANEOUS | Status: DC
  Administered 2020-08-21 (×2): 4 [IU] via SUBCUTANEOUS
  Administered 2020-08-22: 7 [IU] via SUBCUTANEOUS
  Administered 2020-08-22: 3 [IU] via SUBCUTANEOUS
  Administered 2020-08-22: 4 [IU] via SUBCUTANEOUS
  Administered 2020-08-22: 7 [IU] via SUBCUTANEOUS
  Administered 2020-08-23: 3 [IU] via SUBCUTANEOUS

## 2020-08-20 MED ORDER — VITAL AF 1.2 CAL PO LIQD: 1000.0000 mL | ORAL | Status: DC

## 2020-08-20 MED ORDER — INSULIN ASPART 100 UNIT/ML ~~LOC~~ SOLN: 8.0000 [IU] | SUBCUTANEOUS | Status: DC

## 2020-08-20 MED ORDER — DEXTROSE 10 % IV SOLN: INTRAVENOUS | Status: DC

## 2020-08-20 MED ORDER — CLONIDINE HCL 0.2 MG/24HR TD PTWK
0.2000 mg | MEDICATED_PATCH | TRANSDERMAL | Status: DC
  Administered 2020-08-21: 0.2 mg via TRANSDERMAL
  Filled 2020-08-20: qty 1

## 2020-08-20 MED ORDER — METOPROLOL TARTRATE 5 MG/5ML IV SOLN
5.0000 mg | Freq: Four times a day (QID) | INTRAVENOUS | Status: DC
  Administered 2020-08-20 – 2020-08-21 (×3): 5 mg via INTRAVENOUS
  Filled 2020-08-20 (×3): qty 5

## 2020-08-20 MED ORDER — SODIUM CHLORIDE 0.9 % IV SOLN
750.0000 mg | Freq: Two times a day (BID) | INTRAVENOUS | Status: DC
  Administered 2020-08-20 – 2020-08-26 (×13): 750 mg via INTRAVENOUS
  Filled 2020-08-20 (×14): qty 7.5

## 2020-08-20 MED ORDER — ACETAMINOPHEN 160 MG/5ML PO SOLN
650.0000 mg | Freq: Once | ORAL | Status: AC
  Administered 2020-08-20: 650 mg
  Filled 2020-08-20: qty 20.3

## 2020-08-20 NOTE — Progress Notes (Signed)
[place] Progress Note Patient Name: GRACIELA PLATO DOB: February 11, 1966 MRN: [id number]   Date of Service  08/20/2020  HPI/Events of Note  Nursing unable to place NGT after 3 attempts. Not able to convert Amlodipine, Eliquis, Trazodone and Gabapentin to IV equivalents.   eICU Interventions  Plan: 1. D10W to run at 45 mL/hour. 2. D/C tube feeding Novolog coverage.  3. Change Catapres per tube to Catapres patch 0.2 mg to skin Q week. 4. Change Protonix per tube to Protonix IV.  5. Hepatin IV infusion per pharmacy.      Intervention Category Major Interventions: Other:  Lysle Dingwall 08/20/2020, 11:02 PM

## 2020-08-20 NOTE — Progress Notes (Signed)
More than 3 attempts by 3 different RNs including charge RN to place NG tube with blood noted. Smallest NG tube was 94F. Notified Dr. Oletta Darter. Will continue to monitor.

## 2020-08-20 NOTE — Progress Notes (Signed)
[place] Progress Note Patient Name: [name] DOB: 02-08-1966 MRN: [id number]   Date of Service  08/20/2020  HPI/Events of Note  Patient extubated and gastric tube pulled out as well. Patient is on enteral feeds, Eliquis and Trazodone. Patient is NPO.   eICU Interventions  Plan: 1. Replace NGT.  2. Portable abdominal film s/p NGT replacement.  3. Resume tube feeds and enteral medications s/p replacement of NGT.      Intervention Category Major Interventions: Other:  Lysle Dingwall 08/20/2020, 8:46 PM

## 2020-08-20 NOTE — Progress Notes (Signed)
NAME:  [name], MRN:  [id number], DOB:  [birth year], LOS: 7 ADMISSION DATE:  08/13/2020, CONSULTATION DATE:  08/14/20 REFERRING MD:  edp, CHIEF COMPLAINT:  AMS then sz Fransisca Connors failure  Brief History   9 yobf smoker with HBP no known sz disorder to ER Pm 10/20 with ams then sz with w/u pos for AG met acidosis/ fever to 102 ? Source and rec to trx to [facility] when bed available / PCCM service at [facility] asked to consult awaiting trx.   Past Medical History   Alcohol use  Anxiety Chronic anemia Chronic combined systolic and diastolic CHF (congestive heart failure) (HCC)  CKD (chronic kidney disease), stage III (HCC)  Cocaine abuse (HCC) COPD (chronic obstructive pulmonary disease) ([place]) Diabetes mellitus, type 2 ([place])  Diabetic Charcot foot ([place])  Essential hypertension  Morbid obesity ([place]) (01/31/2008)  Noncompliance with medication regimen PAT (paroxysmal atrial tachycardia) (HCC) Sleep apnea Stroke ([place]), Tobacco abuse Urinary incontinence.    Significant Hospital Events   Afib 10/19 pm > amiodarone rx acutely, d/c'd in ER  10/24 changed to PCV to see if more synchronous and less sedation needed 10/25 adjusting glycemic control. Adding home meds back. Looks comfortable on PCV. Weaning sedation w/ goal to start PSV. Tongue swelling preventing extubation  Consults:  Neurology  10/19 PCCM        10/20   Procedures:    Significant Diagnostic Tests:  CT head 10/19  Neg UDS   10/19  Pos cocaine, etoh  < 10  MRI  10/21  1. Small acute/early subacute infarct within the right basal ganglia 2. Scattered T2/FLAIR hyperintensities within the white matter, most likely secondary to age advanced chronic microvascular ischemic disease given clinical risk factors. 3. Numerous bilateral prior basal ganglia and thalamic microhemorrhages with additional larger area of prior hemorrhage in the right frontal lobe. While nonspecific, the distribution suggests prior hypertensive  hemorrhages. 4. Small remote bilateral cerebellar lacunar infarcts.   Micro Data:  U/A   10/19  Neg leuk/nitrites  Covid 19  PCR   10/19   Neg Flu  A/B    PCR    10/19   Neg  BC x 2  10/19 >>> BC x 2  10/20 >>> MRSA  PCR 10/20 >>>neg  LP   10/21 >  Wbc's no org seen >>>neg    Antimicrobials:  Cefepime  10/19 >>> 10/20 Flagyl  10/19 >>> 10/20 Vanc    10/19 >> 10/20 Rocephin 10/20 > 10/21  Ampicillin 10/20 >>>10/21 Acyclovir 10/20  >>>HSV neg    Interim history/subjective:   54 year old obese female currently sedated on pressure control On fentanyl and precedex. Febrile.  Objective   Blood pressure 119/79, pulse 68, temperature (!) 101.2 F (38.4 C), temperature source Axillary, resp. rate 19, height '5\' 1"'  (1.549 m), weight 100.1 kg, last menstrual period 05/19/2016, SpO2 97 %.    Vent Mode: PSV;CPAP FiO2 (%):  [30 %] 30 % Set Rate:  [16 bmp] 16 bmp PEEP:  [5 cmH20] 5 cmH20 Pressure Support:  [5 cmH20] 5 cmH20 Plateau Pressure:  [21 cmH20-22 cmH20] 21 cmH20   Intake/Output Summary (Last 24 hours) at 08/20/2020 1007 Last data filed at 08/20/2020 9753 Gross per 24 hour  Intake 2868.78 ml  Output 1200 ml  Net 1668.78 ml   Filed Weights   08/18/20 0500 08/19/20 0600 08/20/20 0445  Weight: 98.5 kg 99.7 kg 100.1 kg    Examination: General 54 year old female sedated on both fentanyl and Precedex infusions HEENT  ETT to vent. Tongue swollen Pulmonary: Scattered rhonchi bilaterally equal chest rise.   Cardiac: Regular rate and rhythm normal sinus, currently heart rate in the 60s Abdomen: Soft nontender no organomegaly Extremities: Warm dry brisk capillary refill, there is trace lower extremity edema Neuro: Sedated as mentioned above.    Resolved Hospital Problem list   Lactic acidosis secondary to seizures with associated anion gap metabolic acidosis.  Assessment & Plan:  Acute resp failure in setting of new onset sz/ mild dka  - barrier to extubation has been  agitated delirium - tongue is swollen possible from trauma from seizure. Plan Continue to titrate sedating medications for RASS goal of zero to -1 Initiate pressure support attempts VAP bundle A.m. chest x-ray Try SBT/SAT this morning  New onset SZ  Assoc with hyperglycemia, acute R basal ganglia stroke  -Last EEG was negative Plan Keppra  Toxic Metabolic encephalopathy: Multifactorial and related to stroke, seizure, hyperglycemia, also consider possibility of withdrawal -LP with 10 wbcs, glucose ok, acyclovir was discontinued after HSV PCR was negative Plan  Continue Keppra Wean sedating meds as able Metabolic support avoiding hyperglycemia Additional recommendations per neurology Continue home Trazodone, cymbalta, neurontin Will wean continuous sedation and attempt SBT/SAT today  Atrial fibrillation  NSR on metop Plan Continue eliquis Continue telemetry monitoring Continue beta-blockade  AKI Plan Stabilizing. She does have some CKD baseline Cr around 1.4  Blood pressure control Avoid nephrotoxins Strict intake output A.m. chemistry.  Fluid electrolyte balance: Hypokalemia, hyperchloremia, metabolic acidosis/nonanion gap Plan Replace potassium Keep euvolemic A.m. chemistry  Hypertension Plan Continue clonidine, Lopressor and diuretics as BUN and creatinine tolerate Added amlodipine home med today  Diabetes with hyperglycemia -Her glycemic control is still poor Plan continue Lantus to 15 units twice daily Increase tube feed coverage at 5 units every 4 hours Increase to resistant SSI  Best practice:  Diet: TFs Pain/Anxiety/Delirium protocol (if indicated): precedex, prn fentanyl  VAP protocol (if indicated): per protocol DVT prophylaxis: Eliquis GI prophylaxis: ppi Glucose control: SSI Mobility: sbr Code Status: full code Family Communication:  Will update family today. Disposition: ICU  The patient is critically ill with multiple organ systems  failure and requires high complexity decision making for assessment and support, frequent evaluation and titration of therapies, application of advanced monitoring technologies and extensive interpretation of multiple databases.   Critical Care Time devoted to patient care services described in this note is 33 minutes. This time reflects time of care of this [place] . This critical care time does not reflect separately billable procedures or procedure time, teaching time or supervisory time of PA/NP/Med student/Med Resident etc but could involve care discussion time.  Leone [facility] Pulmonary [facility] Critical Care Medicine 08/20/2020 10:07 AM  Pager: [phone] After hours pager: [phone]

## 2020-08-20 NOTE — Procedures (Signed)
Extubation Procedure Note  Patient Details:   Name: [name] DOB: Jul 17, 1966 MRN: [id number]   Airway Documentation:  Airway 7.5 mm (Active)  Secured at (cm) 22 cm 08/20/20 1156  Measured From Lips 08/20/20 [place] 08/20/20 1156  Secured By [employer] 08/20/20 1156  Tube Holder Repositioned Yes 08/20/20 1156  Cuff Pressure (cm H2O) 26 cm H2O 08/19/20 2100  Site Condition Dry 08/20/20 1156   Vent end date: (not recorded) Vent end time: (not recorded)   Evaluation  O2 sats: stable throughout Complications: No apparent complications Patient did tolerate procedure well. Bilateral Breath Sounds: Diminished   Yes  Lamonte Sakai 08/20/2020, 3:26 PM
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
## 2020-08-21 ENCOUNTER — Inpatient Hospital Stay (HOSPITAL_COMMUNITY): Payer: Medicare Other

## 2020-08-21 DIAGNOSIS — G9341 Metabolic encephalopathy: Secondary | ICD-10-CM | POA: Diagnosis not present

## 2020-08-21 DIAGNOSIS — J9600 Acute respiratory failure, unspecified whether with hypoxia or hypercapnia: Secondary | ICD-10-CM | POA: Diagnosis not present

## 2020-08-21 DIAGNOSIS — R569 Unspecified convulsions: Secondary | ICD-10-CM

## 2020-08-21 DIAGNOSIS — N179 Acute kidney failure, unspecified: Secondary | ICD-10-CM | POA: Diagnosis not present

## 2020-08-21 LAB — MAGNESIUM: Magnesium: 1.9 mg/dL (ref 1.7–2.4)

## 2020-08-21 LAB — GLUCOSE, CAPILLARY
Glucose-Capillary: 154 mg/dL — ABNORMAL HIGH (ref 70–99)
Glucose-Capillary: 187 mg/dL — ABNORMAL HIGH (ref 70–99)
Glucose-Capillary: 206 mg/dL — ABNORMAL HIGH (ref 70–99)
Glucose-Capillary: 69 mg/dL — ABNORMAL LOW (ref 70–99)
Glucose-Capillary: 75 mg/dL (ref 70–99)
Glucose-Capillary: 79 mg/dL (ref 70–99)
Glucose-Capillary: 91 mg/dL (ref 70–99)

## 2020-08-21 LAB — CBC
HCT: 41.8 % (ref 36.0–46.0)
Hemoglobin: 12.8 g/dL (ref 12.0–15.0)
MCH: 29.4 pg (ref 26.0–34.0)
MCHC: 30.6 g/dL (ref 30.0–36.0)
MCV: 95.9 fL (ref 80.0–100.0)
Platelets: 181 10*3/uL (ref 150–400)
RBC: 4.36 MIL/uL (ref 3.87–5.11)
RDW: 14 % (ref 11.5–15.5)
WBC: 12.6 10*3/uL — ABNORMAL HIGH (ref 4.0–10.5)
nRBC: 0 % (ref 0.0–0.2)

## 2020-08-21 LAB — HSV(HERPES SMPLX VRS)ABS-I+II(IGG)-CSF: HSV Type I/II Ab, IgG CSF: 7.27 IV — ABNORMAL HIGH (ref <=0.89)

## 2020-08-21 LAB — BASIC METABOLIC PANEL
Anion gap: 9 (ref 5–15)
BUN: 29 mg/dL — ABNORMAL HIGH (ref 6–20)
CO2: 21 mmol/L — ABNORMAL LOW (ref 22–32)
Calcium: 8.7 mg/dL — ABNORMAL LOW (ref 8.9–10.3)
Chloride: 114 mmol/L — ABNORMAL HIGH (ref 98–111)
Creatinine, Ser: 1.52 mg/dL — ABNORMAL HIGH (ref 0.44–1.00)
GFR, Estimated: 40 mL/min — ABNORMAL LOW (ref >60)
Glucose, Bld: 83 mg/dL (ref 70–99)
Potassium: 4.6 mmol/L (ref 3.5–5.1)
Sodium: 144 mmol/L (ref 135–145)

## 2020-08-21 LAB — HEPARIN LEVEL (UNFRACTIONATED): Heparin Unfractionated: 1.94 IU/mL — ABNORMAL HIGH (ref 0.30–0.70)

## 2020-08-21 LAB — APTT: aPTT: 52 seconds — ABNORMAL HIGH (ref 24–36)

## 2020-08-21 MED ORDER — ENOXAPARIN SODIUM 100 MG/ML ~~LOC~~ SOLN
1.0000 mg/kg | Freq: Two times a day (BID) | SUBCUTANEOUS | Status: DC
  Administered 2020-08-21 – 2020-08-27 (×13): 100 mg via SUBCUTANEOUS
  Filled 2020-08-21 (×13): qty 1

## 2020-08-21 MED ORDER — METOPROLOL TARTRATE 5 MG/5ML IV SOLN
10.0000 mg | Freq: Four times a day (QID) | INTRAVENOUS | Status: DC
  Administered 2020-08-21 – 2020-08-22 (×7): 10 mg via INTRAVENOUS
  Filled 2020-08-21 (×8): qty 10

## 2020-08-21 MED ORDER — DEXTROSE 50 % IV SOLN
12.5000 g | INTRAVENOUS | Status: AC
  Administered 2020-08-21: 12.5 g via INTRAVENOUS
  Filled 2020-08-21: qty 50

## 2020-08-21 MED ORDER — NICOTINE 21 MG/24HR TD PT24
21.0000 mg | MEDICATED_PATCH | Freq: Every day | TRANSDERMAL | Status: DC
  Administered 2020-08-21 – 2020-08-28 (×8): 21 mg via TRANSDERMAL
  Filled 2020-08-21 (×8): qty 1

## 2020-08-21 MED ORDER — OSMOLITE 1.2 CAL PO LIQD
1000.0000 mL | ORAL | Status: DC
  Administered 2020-08-21 (×2): 1000 mL

## 2020-08-21 NOTE — Progress Notes (Addendum)
Patient HR elevates to 130s-150s for a short period and then returns to 110s or SR. She is having increased frequency of these occurrences. Patient has not c/o pain.   Spoke to [employer], Therapist, sports at [employer]. She suggested morning labs be drawn immediately. Notified phlebotomy to draw labs STAT. Will continue to monitor.

## 2020-08-21 NOTE — Progress Notes (Signed)
Nutrition Follow-up  DOCUMENTATION CODES:   Obesity unspecified  INTERVENTION:  - will order TF via small bore NGT: Osmolite 1.2 @ 40 ml/hr to advance by 10 ml every 8 hours to reach goal rate of 60 ml/hr with 45 ml Prosource TF BID. - at goal rate, this regimen will provide 1808 kcal, 102 grams protein, and 1168 ml free water.  - free water flush to continue to be per CCM.   NUTRITION DIAGNOSIS:   Inadequate oral intake related to inability to eat as evidenced by NPO status. -ongoing  GOAL:   Patient will meet greater than or equal to 90% of their needs -unmet/unable to meet at this time  MONITOR:   Diet advancement, TF tolerance, Labs, Weight trends  ASSESSMENT:   54 y.o. female with medical history of HTN, type 2 DM, afib on Eliquis, seizures, tobacco/polysubstance abuse, non-ischemic cardiomyopathy, stage 3 CKD, depression, prior CVA, and GERD. She presented to the ED after being found unresponsive on 10/19 night by her boyfriend. EMS was called and she was intubated after arrival to the [facility] ED before transferring to [facility].  Significant Events:  10/20- intubation in the [facility] ED and transfer to [facility]; OGT placed 10/21- initial RD assessment 10/22- TF initiation  10/26- extubation and OGT removal   She is noted to be a/o to self only. Patient was extubated yesterday at ~1525. She remains NPO at this time. SLP saw patient earlier this AM; able to talk with SLP who reports patient is unable to manage her secretions and NPO status remains appropriate.  Per review of notes, more than 3 attempts by 3 different RNs made to place NGT at bedside. Due to attempts being unsuccessful, plan is for small bore NGT placement which was able to be done earlier this AM.   Visualized picture from bedside abdominal xray. Small bore NGT in the R nare and initial xray image showed tip at about the GE junction with recommendation for advancement; RN currently advancing tube and then to  repeat abdominal xray.   Will order TF as ordered above. Order remains in place at this time for 250 ml free water QID (1000 ml/day). Estimated nutrition needs updated and based on weight on 10/20 (82.6 kg) as weight has been up since that time. Generalized mild pitting edema and mild pitting edema to BUE documented in the flow sheet.   Per notes: - new onset seizures and acute R basal ganglia stroke - toxic metabolic encephalopathy   Labs reviewed; CBGs: 75, 69, 91 mg/dl, Cl: 114 mmol/l, BUN: 29 mg/dl, Ca: 8.7 mg/dl, GFR: 40 ml/min.  Medications reviewed; sliding scale novolog, 15 units lantus BID, 5 mg tradjenta/day, 40 mg IV protonix/day.  IVF; D10 @ 45 ml/hr (367 kcal).   Diet Order:   Diet Order            Diet NPO time specified  Diet effective now                 EDUCATION NEEDS:   No education needs have been identified at this time  Skin:  Skin Assessment: Skin Integrity Issues: Skin Integrity Issues:: Stage II, Other (Comment) Stage II: sacrum (newly documented 10/26) Other: skin tear to sacrum  Last BM:  10/26 (type 5 x1)  Height:   Ht Readings from Last 1 Encounters:  08/14/20 5\' 1"  (1.549 m)    Weight:   Wt Readings from Last 1 Encounters:  08/20/20 100.1 kg    Estimated Nutritional  Needs:  Kcal:  1820-2065 kcal Protein:  90-105 grams Fluid:  >/= 1.8 L/day     Jarome Matin, MS, RD, LDN, CNSC Inpatient Clinical Dietitian RD pager # available in [facility]  After hours/weekend pager # available in [facility]

## 2020-08-21 NOTE — TOC Progression Note (Signed)
Transition of Care [facility]) - Progression Note    Patient Details  Name: [name] MRN: [id number] Date of Birth: 02/24/1966  [facility]) CM/SW Contact  Leeroy Cha, RN Phone Number: 08/21/2020, 7:41 AM  Clinical Narrative:    Extubated on [phone] at 1526 to 4l [place], temp 101.2, iv precededx,iv fentanyl, d10 at 34ml/hr, iv heparin, iv keppra, wbc=12.6   Expected Discharge Plan: [place] Barriers to Discharge: Barriers Unresolved (comment) (ventilation)  Expected Discharge Plan and Services Expected Discharge Plan: [place] arrangements for the past 2 months: Single Family Home                                       Social Determinants of Health (SDOH) Interventions    Readmission Risk Interventions No flowsheet data found.

## 2020-08-21 NOTE — Progress Notes (Signed)
Hypoglycemic Event  CBG: 69  Treatment: 12.5 g D50  Symptoms: None  Follow-up CBG: Time:0900 CBG Result:91  Possible Reasons for Event: NPO  Comments/MD notified: MD Rolm Gala

## 2020-08-21 NOTE — Progress Notes (Signed)
NAME:  [name], MRN:  [id number], DOB:  [birth year], LOS: 8 ADMISSION DATE:  08/13/2020, CONSULTATION DATE:  08/14/20 REFERRING MD:  edp, CHIEF COMPLAINT:  AMS then sz Fransisca Connors failure  Brief History   57 yobf smoker with HBP no known sz disorder to ER Pm 10/20 with ams then sz with w/u pos for AG met acidosis/ fever to 102 ? Source and rec to trx to [facility] when bed available / PCCM service at [facility] asked to consult awaiting trx.   Past Medical History   Alcohol use  Anxiety Chronic anemia Chronic combined systolic and diastolic CHF (congestive heart failure) (HCC)  CKD (chronic kidney disease), stage III (HCC)  Cocaine abuse (HCC) COPD (chronic obstructive pulmonary disease) ([place]) Diabetes mellitus, type 2 ([place])  Diabetic Charcot foot ([place])  Essential hypertension  Morbid obesity ([place]) (01/31/2008)  Noncompliance with medication regimen PAT (paroxysmal atrial tachycardia) (HCC) Sleep apnea Stroke ([place]), Tobacco abuse Urinary incontinence.    Significant Hospital Events   Afib 10/19 pm > amiodarone rx acutely, d/c'd in ER  10/24 changed to PCV to see if more synchronous and less sedation needed 10/25 adjusting glycemic control. Adding home meds back. Looks comfortable on PCV. Weaning sedation w/ goal to start PSV. Tongue swelling preventing extubation  Consults:  Neurology  10/19 PCCM        10/20   Procedures:    Significant Diagnostic Tests:  CT head 10/19  Neg UDS   10/19  Pos cocaine, etoh  < 10  MRI  10/21  1. Small acute/early subacute infarct within the right basal ganglia 2. Scattered T2/FLAIR hyperintensities within the white matter, most likely secondary to age advanced chronic microvascular ischemic disease given clinical risk factors. 3. Numerous bilateral prior basal ganglia and thalamic microhemorrhages with additional larger area of prior hemorrhage in the right frontal lobe. While nonspecific, the distribution suggests prior hypertensive hemorrhages.  4. Small remote bilateral cerebellar lacunar infarcts.   Micro Data:  U/A   10/19  Neg leuk/nitrites  Covid 19  PCR   10/19   Neg Flu  A/B    PCR    10/19   Neg  BC x 2  10/19 >>> BC x 2  10/20 >>> MRSA  PCR 10/20 >>>neg  LP   10/21 >  Wbc's no org seen >>>neg    Antimicrobials:  Cefepime  10/19 >>> 10/20 Flagyl  10/19 >>> 10/20 Vanc    10/19 >> 10/20 Rocephin 10/20 > 10/21  Ampicillin 10/20 >>>10/21 Acyclovir 10/20  >>>HSV neg    Interim history/subjective:   Extubated yesterday. Today AOx4. Son chris at bedside. Speech garbled. Multiple attempts for NG tube last night failed.  Objective   Blood pressure (!) 150/86, pulse (!) 102, temperature 99.8 F (37.7 C), temperature source Axillary, resp. rate 18, height '5\' 1"'  (1.549 m), weight 100.1 kg, last menstrual period 05/19/2016, SpO2 95 %.    Vent Mode: PSV;CPAP FiO2 (%):  [30 %] 30 % PEEP:  [5 cmH20] 5 cmH20 Pressure Support:  [5 cmH20] 5 cmH20   Intake/Output Summary (Last 24 hours) at 08/21/2020 0842 Last data filed at 08/21/2020 0300 Gross per 24 hour  Intake 845.6 ml  Output 1050 ml  Net -204.4 ml   Filed Weights   08/18/20 0500 08/19/20 0600 08/20/20 0445  Weight: 98.5 kg 99.7 kg 100.1 kg    Examination: General appears older than stated age. HEENTTongue swollen Pulmonary: Scattered rhonchi bilaterally equal chest rise.   Cardiac: tachycardic, regularl  Abdomen: Soft nontender no organomegaly Extremities: Warm dry brisk capillary refill, there is trace lower extremity edema Neuro: AOx4. Speech garbled.  Resolved Hospital Problem list   Lactic acidosis secondary to seizures with associated anion gap metabolic acidosis. AKI  Assessment & Plan:    New onset SZ  Assoc with hyperglycemia, acute R basal ganglia stroke  -Last EEG was negative Plan Keppra Will consult PT/OT/SLP.   Toxic Metabolic encephalopathy: Multifactorial and related to stroke, seizure, hyperglycemia, also consider possibility of  withdrawal -LP with 10 wbcs, glucose ok, acyclovir was discontinued after HSV PCR was negative Plan  Continue Keppra Improved today. Po meds held due to concern for dysphagia. Will obtain SLP evaluation. Otherwise may need NG tube in radiology as multiple attempts failed at bedside by nursing yesterday  Acute resp failure in setting of new onset sz/ mild dka  - tongue is swollen possible from trauma from seizure. Plan Extubated 10/26  Atrial fibrillation tachycardic Plan Continue eliquis  Continue telemetry monitoring Increase metoprolol IV for now  Fluid electrolyte balance: Hypokalemia, hyperchloremia, metabolic acidosis/nonanion gap Plan Replace potassium Keep euvolemic A.m. chemistry  Hypertension Plan Continue clonidine patch, metoprolol IV. Need to add back other home meds once able to take pills or NG tube.  Diabetes with hyperglycemia Plan continue Lantus to 15 units twice daily Increase tube feed coverage at 5 units every 4 hours Increase to resistant SSI Currently NPO, monitor for hypoglycemia  Best practice:  Diet: NPO - will obtain SLP eval Pain/Anxiety/Delirium protocol (if indicated): n/a VAP protocol (if indicated): per protocol DVT prophylaxis: Eliquis (currently held due to po status. GI prophylaxis: ppi Glucose control: SSI Mobility: sbr Code Status: full code Family Communication:  Son chris updated at bedside. Disposition: can go to step down. Will sign out to [facility] to assume care on 10/28.   Leone [facility] Pulmonary and Critical Care Medicine 08/21/2020 8:42 AM  Pager: [phone] After hours pager: [phone]

## 2020-08-21 NOTE — Evaluation (Signed)
Clinical/Bedside Swallow Evaluation Patient Details  Name: [name] MRN: [id number] Date of Birth: [birth year]  Today's Date: 08/21/2020 Time: SLP Start Time (ACUTE ONLY): 25 SLP Stop Time (ACUTE ONLY): 0945 SLP Time Calculation (min) (ACUTE ONLY): 25 min  Past Medical History:  Past Medical History:  Diagnosis Date  . Alcohol use   . Ankle fracture, lateral malleolus, closed 12/30/2011  . Anxiety   . Breast mass, left 12/15/2011  . Chronic anemia   . Chronic combined systolic and diastolic CHF (congestive heart failure) ([place])   . CKD (chronic kidney disease), stage III ([place])    pt denies  . Cocaine abuse ([place])   . COPD (chronic obstructive pulmonary disease) ([place])   . Diabetes mellitus, type 2 ([place])   . Diabetic Charcot foot ([place]) 12/30/2011  . Essential hypertension   . History of GI bleed 12/05/2009   Qualifier: Diagnosis of  By: Zeb Comfort    . Hyperlipidemia   . Hypertensive cardiomyopathy ([place]) 11/08/2012   a. 05/2016: echo showing EF of 20-25% with normal cors by cath in 07/2016.  . Morbid obesity ([place]) 01/31/2008   Qualifier: Diagnosis of  By: Lenn Cal    . Noncompliance with medication regimen   . Obesity   . Panic attacks   . PAT (paroxysmal atrial tachycardia) ([place])    a. started on amiodarone 07/2017 for this.  . Sleep apnea    does not use cpap  . Stroke ([place])    06/24/17  . Tobacco abuse   . Urinary incontinence    Past Surgical History:  Past Surgical History:  Procedure Laterality Date  . BREAST BIOPSY    . CARDIAC CATHETERIZATION N/A 07/28/2016   Procedure: Left Heart Cath and Coronary Angiography;  Surgeon: Jettie Booze, MD;  Location: [place] [facility];  Service: Cardiovascular;  Laterality: N/A;  . COLONOSCOPY N/A 05/10/2019   Procedure: COLONOSCOPY;  Surgeon: Danie Binder, MD;  [facility];  Service: Endoscopy;  Laterality: N/A;  Phenergan 12.5 mg IV in pre-op  . DILATION AND CURETTAGE OF UTERUS    . I & D  EXTREMITY Bilateral 09/22/2017   Procedure: BILATERAL DEBRIDEMENT LEG/FOOT ULCERS, APPLY VERAFLO WOUND VAC;  Surgeon: Newt Minion, MD;  Location: [place];  Service: Orthopedics;  Laterality: Bilateral;  . I & D EXTREMITY Right 10/11/2018   Procedure: IRRIGATION AND DEBRIDEMENT RIGHT HAND;  Surgeon: [name], MD;  Location: [place];  Service: Orthopedics;  Laterality: Right;  . I & D EXTREMITY Right 10/13/2018   Procedure: REPEAT IRRIGATION AND DEBRIDEMENT RIGHT HAND;  Surgeon: [name], MD;  Location: [place];  Service: Orthopedics;  Laterality: Right;  . I & D EXTREMITY Right 11/22/2018   Procedure: IRRIGATION AND DEBRIDEMENT AND PINNING RIGHT HAND;  Surgeon: [name], MD;  Location: [place];  Service: Orthopedics;  Laterality: Right;  . IR RADIOLOGIST EVAL & MGMT  07/05/2018  . POLYPECTOMY  05/10/2019   Procedure: POLYPECTOMY;  Surgeon: Danie Binder, MD;  [facility];  Service: Endoscopy;;  . SKIN SPLIT GRAFT Bilateral 09/28/2017   Procedure: BILATERAL SPLIT THICKNESS SKIN GRAFT LEGS/FEET AND APPLY VAC;  Surgeon: Newt Minion, MD;  Location: [place];  Service: Orthopedics;  Laterality: Bilateral;  . SKIN SPLIT GRAFT Right 11/22/2018   Procedure: SKIN GRAFT SPLIT THICKNESS;  Surgeon: [name], MD;  Location: [place];  Service: Orthopedics;  Laterality: Right;   HPI:  54yo female admitted 08/14/20 with seizures, found unresponsive. PMH:  alcohol/cocaine use, anxiety, anemia, CHF, CKD, COPD, DM, essential HTN, GIB, HLD, cardiomyopathy, obesity, PAT (paroxysmal atrial tachycardia), OSA, CVA (2018).  Intubated 10/19-26/21. MRI = subtle acute/early subacute infarct Right basal ganglia. Numerous bilateral prior basal ganglia and thalamic microhemorrhages with additional larger area of prior hemorrhage. Small remote bilateral cerebellar lacunar infarcts.   Assessment / Plan / Recommendation Clinical Impression  Pt seen at bedside to determine readiness for PO intake after  7 days intubated. Pt has a history of CVA. She is edentulous - son reports she has upper and lower dentures, and tolerated regular solids and thin liquids with dentures in place prior to admit. Pt tongue appears to be swollen, and pt's son indicates her lower lip appears to be swollen as well. Pt demonstrates generalized oral weakness, and her speech is difficult to understand. This is likely due to a combination of factors, including being edentulous, lip and tongue swelling, and poor secretion management. Pt's voice quality is wet and she is unable to clear her throat or cough to effectively manage her secretions. Despite this, she constantly requests for me to feed her. Oral care was completed with suction, with removal of bloody secretions. This improved voice quality for a few minutes only. Further requests to clear her throat or cough were unsuccessful. Following oral care, pt accepted a single ice chip. Poor oral manipulation and right anterior leakage was observed, with immediate cough response after the swallow. Recommend continuing NPO status at this time, with consideration of nonoral feeding method. Pt/son were encouraged to work on throat clear and cough to facilitate secretion management, which is imperative prior to safe PO intake. Incentive spirometer may also be helpful to encourage deep breathing and stronger cough. RN and MD informed. SLP will continue to follow to assess pt readiness to begin PO intake, and/or complete instrumental study given length of intubation.   SLP Visit Diagnosis: Dysphagia, unspecified (R13.10)    Aspiration Risk  Severe aspiration risk;Risk for inadequate nutrition/hydration    Diet Recommendation NPO   Medication Administration: Via alternative means    Other  Recommendations Oral Care Recommendations: Oral care QID;Staff/trained caregiver to provide oral care Other Recommendations: Have oral suction available   Follow up Recommendations Other (comment)  (TBD)      Frequency and Duration min 2x/week  1 week;2 weeks       Prognosis Prognosis for Safe Diet Advancement: Fair Barriers to Reach Goals: Severity of deficits      Swallow Study   General Date of Onset: 08/13/20 HPI: 54yo female admitted 08/14/20 with seizures, found unresponsive. PMH: alcohol/cocaine use, anxiety, anemia, CHF, CKD, COPD, DM, essential HTN, GIB, HLD, cardiomyopathy, obesity, PAT (paroxysmal atrial tachycardia), OSA, CVA (2018).  Intubated 10/19-26/21. MRI = subtle acute/early subacute infarct Right basal ganglia. Numerous bilateral prior basal ganglia and thalamic microhemorrhages with additional larger area of prior hemorrhage. Small remote bilateral cerebellar lacunar infarcts. Type of Study: Bedside Swallow Evaluation Previous Swallow Assessment: no prior BSE found. SLE 2018 revealed mild-mod dysarthria and cognitive deficits in attention, orientation, recall, and executive functions. Diet Prior to this Study: NPO Temperature Spikes Noted: No (low grade @ 99) Respiratory Status: Nasal cannula History of Recent Intubation: Yes Length of Intubations (days): 7 days Date extubated: 08/20/20 Behavior/Cognition: Alert;Cooperative;Pleasant mood Oral Cavity Assessment: Edema;Excessive secretions Oral Care Completed by SLP: Yes Oral Cavity - Dentition: Edentulous Vision: Functional for self-feeding Self-Feeding Abilities: Total assist Patient Positioning: Upright in bed Baseline Vocal Quality: Wet Volitional Cough: Weak;Wet;Congested Volitional Swallow: Able to  elicit    Oral/Motor/Sensory Function Overall Oral Motor/Sensory Function: Generalized oral weakness (lingual and lower lip swelling) Lingual ROM:  (reduced) Lingual Strength: Reduced Lingual Sensation: Within Functional Limits Velum:  (unable to visualize due to swollen tongue)   Ice Chips Ice chips: Impaired Presentation: Spoon Oral Phase Impairments: Reduced labial seal;Reduced lingual  movement/coordination;Poor awareness of bolus Oral Phase Functional Implications: Right anterior spillage;Oral residue Pharyngeal Phase Impairments: Suspected delayed Swallow;Cough - Delayed    Lizett Chowning B. [name] Ore, [facility], [place] Speech Language Pathologist Office: [phone] Pager: [phone]  Shonna Chock 08/21/2020,10:30 AM

## 2020-08-22 ENCOUNTER — Encounter (HOSPITAL_COMMUNITY): Payer: Self-pay | Admitting: Critical Care Medicine

## 2020-08-22 ENCOUNTER — Other Ambulatory Visit: Payer: Self-pay

## 2020-08-22 ENCOUNTER — Encounter (HOSPITAL_COMMUNITY): Payer: Self-pay

## 2020-08-22 DIAGNOSIS — J9600 Acute respiratory failure, unspecified whether with hypoxia or hypercapnia: Secondary | ICD-10-CM | POA: Diagnosis not present

## 2020-08-22 DIAGNOSIS — G0490 Encephalitis and encephalomyelitis, unspecified: Secondary | ICD-10-CM | POA: Diagnosis not present

## 2020-08-22 LAB — GLUCOSE, CAPILLARY
Glucose-Capillary: 103 mg/dL — ABNORMAL HIGH (ref 70–99)
Glucose-Capillary: 105 mg/dL — ABNORMAL HIGH (ref 70–99)
Glucose-Capillary: 123 mg/dL — ABNORMAL HIGH (ref 70–99)
Glucose-Capillary: 195 mg/dL — ABNORMAL HIGH (ref 70–99)
Glucose-Capillary: 227 mg/dL — ABNORMAL HIGH (ref 70–99)
Glucose-Capillary: 73 mg/dL (ref 70–99)
Glucose-Capillary: 95 mg/dL (ref 70–99)

## 2020-08-22 LAB — CBC
HCT: 41.2 % (ref 36.0–46.0)
Hemoglobin: 12.6 g/dL (ref 12.0–15.0)
MCH: 29.4 pg (ref 26.0–34.0)
MCHC: 30.6 g/dL (ref 30.0–36.0)
MCV: 96.3 fL (ref 80.0–100.0)
Platelets: 247 10*3/uL (ref 150–400)
RBC: 4.28 MIL/uL (ref 3.87–5.11)
RDW: 14.4 % (ref 11.5–15.5)
WBC: 9.6 10*3/uL (ref 4.0–10.5)
nRBC: 0.2 % (ref 0.0–0.2)

## 2020-08-22 MED ORDER — PNEUMOCOCCAL VAC POLYVALENT 25 MCG/0.5ML IJ INJ
0.5000 mL | INJECTION | INTRAMUSCULAR | Status: DC
  Filled 2020-08-22: qty 0.5

## 2020-08-22 MED ORDER — INFLUENZA VAC SPLIT QUAD 0.5 ML IM SUSY
0.5000 mL | PREFILLED_SYRINGE | INTRAMUSCULAR | Status: AC
  Administered 2020-08-23: 0.5 mL via INTRAMUSCULAR
  Filled 2020-08-22: qty 0.5

## 2020-08-22 MED ORDER — DEXMEDETOMIDINE HCL IN NACL 200 MCG/50ML IV SOLN
0.2000 ug/kg/h | INTRAVENOUS | Status: DC
  Administered 2020-08-22: 0.4 ug/kg/h via INTRAVENOUS
  Administered 2020-08-22: 0.6 ug/kg/h via INTRAVENOUS
  Administered 2020-08-22: 0.2 ug/kg/h via INTRAVENOUS
  Filled 2020-08-22 (×3): qty 50

## 2020-08-22 MED ORDER — INSULIN GLARGINE 100 UNIT/ML ~~LOC~~ SOLN
5.0000 [IU] | Freq: Two times a day (BID) | SUBCUTANEOUS | Status: DC
  Filled 2020-08-22: qty 0.05

## 2020-08-22 NOTE — Progress Notes (Signed)
SLP Cancellation Note  Patient Details Name: [name] MRN: [id number] DOB: [birth year]   Cancelled treatment:       Reason Eval/Treat Not Completed: Other (comment) Pt currently sedated on precedex. RN reports pt pulled NG tube, so continues to have no source of nutrition. SLP will follow up 08/23/20 to determine readiness for PO intake.  Tobin Cadiente B. [name] Ore, [facility], [place] Speech Language Pathologist Office: [phone] Pager: [phone]  Shonna Chock 08/22/2020, 10:18 AM

## 2020-08-22 NOTE — Progress Notes (Signed)
Inpatient Diabetes Program Recommendations  AACE/ADA: New Consensus Statement on Inpatient Glycemic Control (2015)  Target Ranges:  Prepandial:   less than 140 mg/dL      Peak postprandial:   less than 180 mg/dL (1-2 hours)      Critically ill patients:  140 - 180 mg/dL   Lab Results  Component Value Date   GLUCAP 123 (H) 08/22/2020   HGBA1C 13.8 (H) 08/14/2020    Review of Glycemic Control Results for AVEREY, KONING (MRN [id number]) as of 08/22/2020 12:24  Ref. Range 08/21/2020 23:32 08/22/2020 03:21 08/22/2020 07:35 08/22/2020 11:55  Glucose-Capillary Latest Ref Range: 70 - 99 mg/dL 206 (H) 227 (H) 195 (H) 123 (H)   Diabetes history: Type 2 DM Outpatient Diabetes medications: Tradjenta 5 mg QD Current orders for Inpatient glycemic control: Lantus 15 units BID, Novolog 0-20 units Q4H, Tradjenta 5 mg QD NG pulled out.  Inpatient Diabetes Program Recommendations:   Noted glucose trends increase considerably with tube feeds. Now that NG removed: Consider reducing Lantus to 10 units BID.   Thanks, Bronson Curb, MSN, RNC-OB Diabetes Coordinator [phone] (8a-5p)

## 2020-08-22 NOTE — Progress Notes (Signed)
  Speech Language Pathology Treatment: Dysphagia  Patient Details Name: [name] MRN: [id number] DOB: [birth year] Today's Date: 08/22/2020 Time: 3710-6269 SLP Time Calculation (min) (ACUTE ONLY): 25 min  Assessment / Plan / Recommendation Clinical Impression  Pt seen at bedside to assess readiness for PO intake. No family present at this time. Oral care was completed with suction. Intermittent wet voice noted prior to, during, and after oral care. Pt continues to exhibit poor oral awareness and control of ice chip, with immediate cough response elicited. Pt appears to be managing her secretions slightly better today - wet voice quality is intermittent now. Her speech is more dysarthric today, however, which may be due to lethargy/medication or generalized weakness.   Pt reporting pain in her bottom (7/10) despite repositioning. RN and MD informed. SLP will continue to follow to assess readiness for po intake.   HPI HPI: 54yo female admitted 08/14/20 with seizures, found unresponsive. PMH: alcohol/cocaine use, anxiety, anemia, CHF, CKD, COPD, DM, essential HTN, GIB, HLD, cardiomyopathy, obesity, PAT (paroxysmal atrial tachycardia), OSA, CVA (2018).  Intubated 10/19-26/21. MRI = subtle acute/early subacute infarct Right basal ganglia. Numerous bilateral prior basal ganglia and thalamic microhemorrhages with additional larger area of prior hemorrhage. Small remote bilateral cerebellar lacunar infarcts.      SLP Plan  Continue with current plan of care       Recommendations  Diet recommendations: NPO Medication Administration: Via alternative means                Oral Care Recommendations: Oral care QID;Staff/trained caregiver to provide oral care Follow up Recommendations: Other (comment) (TBD) SLP Visit Diagnosis: Dysphagia, unspecified (R13.10) Plan: Continue with current plan of care       [place]. [name] Ore, [facility], [place] Speech Language  Pathologist Office: [phone] Pager: [phone]  Shonna Chock 08/22/2020, 2:16 PM

## 2020-08-22 NOTE — Progress Notes (Signed)
PT Cancellation Note  Patient Details Name: [name] MRN: [id number] DOB: Mar 16, 1966   Cancelled Treatment:    Reason Eval/Treat Not Completed: Patient's level of consciousness, Per RN, patient is not responsive. Will check back another day.   Claretha Cooper 08/22/2020, 11:49 AM [place] Pager [phone] Office [phone]

## 2020-08-22 NOTE — Progress Notes (Signed)
PROGRESS NOTE  AMA MCMASTER RCB:[id number] DOB: [birth year] DOA: 08/13/2020 PCP: [name], MD  HPI/Recap of past 26 hours: 54 year old African-American female with past medical history significant for essential hypertension, chronic anxiety/depression, asthma, hyperlipidemia, type 2 diabetes, polyneuropathy, paroxysmal A. fib on Eliquis, chronic combined diastolic and systolic CHF, prior CVA, CKD stage III A, polysubstance abuse including cocaine who presented to [name] ED after she was found unresponsive by her boyfriend. She was last seen well earlier in the evening of 08/13/2020. EMS was called and found her with seizure activity that was intermittent. In the ED she was intubated to protect her airway. She was seen by teleneurology with recommendation to continue Keppra 1 g twice daily and to obtain routine EEG as well of MRI brain with and without contrast. CT head without contrast showed no acute findings. PCCM was called and accepted the patient to their service.  No beds at [facility].  She was subsequently transferred to [facility] long for further management.   UDS + cocaine 08/13/20.  Extubated on 08/20/20.  NG tube placed due to concern for dysphagia.  Patient became very agitated on 08/22/20 removed her NG tube.  She was placed on Precedex briefly due to severe agitation.  Precedex is currently off.  Assessment/Plan: Active Problems:   Acute respiratory failure requiring reintubation (HCC)   Lactic acid acidosis   AKI (acute kidney injury) ([place])   Atrial fibrillation with rapid ventricular response (HCC)   Pressure injury of skin   Respiratory failure ([place])   Grand mal seizure (HCC)   Fever  Toxic metabolic encephalopathy likely multifactorial secondary to seizure, hypoglycemia, acute right basal ganglia stroke, cocaine Required Precedex overnight, currently off Closely monitor off Precedex Continue Keppra Reorient as needed Delirium precautions  New onset  seizure in the setting of acute right basal ganglia stroke Seen by neurology Continue Keppra Continue seizure precautions  Acute right basal ganglia stroke PT OT/speech consulted and following Fall and aspiration precautions  Paroxysmal A. fib On Eliquis prior to admission Currently on full dose subcu Lovenox twice daily  Essential hypertension BP is not at goal Continue home oral antihypertensives Continue to monitor vital signs  Type 2 diabetes Obtain hemoglobin A1c Insulin sliding scale  Polysubstance abuse UDS positive for cocaine 08/13/2020 Tobacco use disorder Polysubstance abuse cessation counseling when more alert  Physical debility PT OT to assess Fall precautions  Code Status: Full code  Family Communication: Daughter at bedside  Disposition Plan: Likely to home with home health   Consultants:  Neurology  PCCM  Procedures:  None  Antimicrobials:  None  DVT prophylaxis:  Subcu Lovenox daily  Status is: Inpatient    Dispo:  Patient From: Home  Planned Disposition: Home with Health Care Svc  Expected discharge date: 08/24/20  Medically stable for discharge: No, ongoing management of new onset seizure.         Objective: Vitals:   08/22/20 0735 08/22/20 0800 08/22/20 0900 08/22/20 1000  BP:  94/67 104/73 130/84  Pulse:  76 83 83  Resp:  '18 17 17  ' Temp: 98.7 F (37.1 C)     TempSrc: Axillary     SpO2:  100% 98% 99%  Weight:      Height:        Intake/Output Summary (Last 24 hours) at 08/22/2020 1144 Last data filed at 08/22/2020 1000 Gross per 24 hour  Intake 1793.63 ml  Output 2000 ml  Net -206.37 ml   [employer]  08/19/20 0600 08/20/20 0445 08/22/20 0500  Weight: 99.7 kg 100.1 kg 100.8 kg    Exam:  . General: 54 y.o. year-old female well developed well nourished in no acute distress.  Somnolent on Precedex. . Cardiovascular: Regular rate and rhythm with no rubs or gallops.  No thyromegaly or JVD noted.     Marland Kitchen Respiratory: Clear to auscultation with no wheezes or rales. Good inspiratory effort. . Abdomen: Soft nontender nondistended with normal bowel sounds x4 quadrants. . Musculoskeletal: No lower extremity edema bilateral. . Psychiatry: Mood is appropriate for condition and setting   Data Reviewed: CBC: Recent Labs  Lab 08/17/20 0253 08/19/20 0301 08/20/20 0304 08/21/20 0234 08/22/20 0226  WBC 9.3 10.7* 9.8 12.6* 9.6  HGB 13.1 13.4 13.0 12.8 12.6  HCT 41.5 43.1 42.0 41.8 41.2  MCV 92.8 95.6 95.5 95.9 96.3  PLT 162 179 148* 181 299   Basic Metabolic Panel: Recent Labs  Lab 08/16/20 0811 08/16/20 1430 08/16/20 1636 08/17/20 0253 08/17/20 1656 08/19/20 0301 08/20/20 0304 08/21/20 0228  NA 146*  --   --  147*  --  144 144 144  K 3.3*  --   --  3.1*  --  3.1* 4.2 4.6  CL 114*  --   --  116*  --  114* 113* 114*  CO2 18*  --   --  19*  --  20* 20* 21*  GLUCOSE 175*  --   --  201*  --  303* 263* 83  BUN 17  --   --  20  --  22* 26* 29*  CREATININE 1.95*  --   --  1.86*  --  1.61* 1.70* 1.52*  CALCIUM 8.2*  --   --  8.5*  --  8.4* 8.6* 8.7*  MG  --  1.7 1.6* 1.8 1.9  --   --  1.9  PHOS  --  3.2 3.2 3.0 2.4*  --   --   --    GFR: Estimated Creatinine Clearance: 45 mL/min (A) (by C-G formula based on SCr of 1.52 mg/dL (H)). Liver Function Tests: Recent Labs  Lab 08/16/20 0811 08/20/20 0304  AST 15 8*  ALT 9 7  ALKPHOS 77 56  BILITOT 0.8 0.5  PROT 5.3* 5.2*  ALBUMIN 2.2* 1.8*   No results for input(s): LIPASE, AMYLASE in the last 168 hours. Recent Labs  Lab 08/16/20 1430  AMMONIA 37*   Coagulation Profile: No results for input(s): INR, PROTIME in the last 168 hours. Cardiac Enzymes: No results for input(s): CKTOTAL, CKMB, CKMBINDEX, TROPONINI in the last 168 hours. BNP (last 3 results) No results for input(s): PROBNP in the last 8760 hours. HbA1C: No results for input(s): HGBA1C in the last 72 hours. CBG: Recent Labs  Lab 08/21/20 1557 08/21/20 1925  08/21/20 2332 08/22/20 0321 08/22/20 0735  GLUCAP 154* 187* 206* 227* 195*   Lipid Profile: No results for input(s): CHOL, HDL, LDLCALC, TRIG, CHOLHDL, LDLDIRECT in the last 72 hours. Thyroid Function Tests: No results for input(s): TSH, T4TOTAL, FREET4, T3FREE, THYROIDAB in the last 72 hours. Anemia Panel: No results for input(s): VITAMINB12, FOLATE, FERRITIN, TIBC, IRON, RETICCTPCT in the last 72 hours. Urine analysis:    Component Value Date/Time   COLORURINE STRAW (A) 08/13/2020 2027   APPEARANCEUR CLEAR 08/13/2020 2027   LABSPEC 1.015 08/13/2020 2027   PHURINE 7.0 08/13/2020 2027   GLUCOSEU >=500 (A) 08/13/2020 2027   HGBUR SMALL (A) 08/13/2020 2027   BILIRUBINUR NEGATIVE 08/13/2020 2027  KETONESUR NEGATIVE 08/13/2020 2027   PROTEINUR >300 (A) 08/13/2020 2027   UROBILINOGEN 0.2 09/11/2014 1319   NITRITE NEGATIVE 08/13/2020 2027   LEUKOCYTESUR NEGATIVE 08/13/2020 2027   Sepsis Labs: '@LABRCNTIP' (procalcitonin:4,lacticidven:4)  ) Recent Results (from the past 240 hour(s))  Respiratory Panel by RT PCR (Flu A&B, Covid) - Nasopharyngeal Swab     Status: None   Collection Time: 08/13/20  8:44 PM   Specimen: Nasopharyngeal Swab  Result Value Ref Range Status   SARS Coronavirus 2 by RT PCR NEGATIVE NEGATIVE Final    Comment: (NOTE) SARS-CoV-2 target nucleic acids are NOT DETECTED.  The SARS-CoV-2 RNA is generally detectable in upper respiratoy specimens during the acute phase of infection. The lowest concentration of SARS-CoV-2 viral copies this assay can detect is 131 copies/mL. A negative result does not preclude SARS-Cov-2 infection and should not be used as the sole basis for treatment or other patient management decisions. A negative result may occur with  improper specimen collection/handling, submission of specimen other than nasopharyngeal swab, presence of viral mutation(s) within the areas targeted by this assay, and inadequate number of viral copies (<131  copies/mL). A negative result must be combined with clinical observations, patient history, and epidemiological information. The expected result is Negative.  Fact Sheet for Patients:  PinkCheek.be  Fact Sheet for Healthcare Providers:  GravelBags.it  This test is no t yet approved or cleared by the [place] FDA and  has been authorized for detection and/or diagnosis of SARS-CoV-2 by FDA under an Emergency Use Authorization (EUA). This EUA will remain  in effect (meaning this test can be used) for the duration of the COVID-19 declaration under Section 564(b)(1) of the Act, 21 U.S.C. section 360bbb-3(b)(1), unless the authorization is terminated or revoked sooner.     Influenza A by PCR NEGATIVE NEGATIVE Final   Influenza B by PCR NEGATIVE NEGATIVE Final    Comment: (NOTE) The Xpert Xpress SARS-CoV-2/FLU/RSV assay is intended as an aid in  the diagnosis of influenza from Nasopharyngeal swab specimens and  should not be used as a sole basis for treatment. Nasal washings and  aspirates are unacceptable for Xpert Xpress SARS-CoV-2/FLU/RSV  testing.  Fact Sheet for Patients: PinkCheek.be  Fact Sheet for Healthcare Providers: GravelBags.it  This test is not yet approved or cleared by the [place] FDA and  has been authorized for detection and/or diagnosis of SARS-CoV-2 by  FDA under an Emergency Use Authorization (EUA). This EUA will remain  in effect (meaning this test can be used) for the duration of the  Covid-19 declaration under Section 564(b)(1) of the Act, 21  U.S.C. section 360bbb-3(b)(1), unless the authorization is  terminated or revoked. Performed at [facility], [address], [place], [place] [address]   Blood Culture (routine x 2)     Status: None   Collection Time: 08/13/20 11:50 PM   Specimen: BLOOD RIGHT HAND  Result Value Ref Range  Status   Specimen Description BLOOD RIGHT HAND  Final   Special Requests   Final    AEROBIC BOTTLE ONLY Blood Culture results may not be optimal due to an inadequate volume of blood received in culture bottles   Culture   Final    NO GROWTH 5 DAYS Performed at [facility], [address] [place], [place] [address]    Report Status 08/19/2020 FINAL  Final  Blood Culture (routine x 2)     Status: None   Collection Time: 08/14/20 12:02 AM   Specimen: BLOOD RIGHT HAND  Result Value Ref Range Status   Specimen Description BLOOD RIGHT HAND  Final   Special Requests   Final    AEROBIC BOTTLE ONLY Blood Culture results may not be optimal due to an inadequate volume of blood received in culture bottles   Culture   Final    NO GROWTH 5 DAYS Performed at [facility], [address] [place], [place] [address]    Report Status 08/19/2020 FINAL  Final  MRSA PCR Screening     Status: None   Collection Time: 08/14/20  6:54 PM   Specimen: Nasal Mucosa; Nasopharyngeal  Result Value Ref Range Status   MRSA by PCR NEGATIVE NEGATIVE Final    Comment:        The GeneXpert MRSA Assay (FDA approved for NASAL specimens only), is one component of a comprehensive MRSA colonization surveillance program. It is not intended to diagnose MRSA infection nor to guide or monitor treatment for MRSA infections. Performed at [facility], [place] [address], [place], [place] [address]   Anaerobic culture     Status: None   Collection Time: 08/15/20  5:30 PM   Specimen: PATH Cytology CSF; Cerebrospinal Fluid  Result Value Ref Range Status   Specimen Description   Final    CSF Performed at [place] [address] [place], [place] [address]    Special Requests   Final    NONE Performed at [facility], [place] [address], [place], [place] [address]    Gram Stain   Final    WBC PRESENT,BOTH PMN AND MONONUCLEAR RED BLOOD CELLS PRESENT NO ORGANISMS  SEEN CYTOSPIN SMEAR    Culture   Final    NO ANAEROBES ISOLATED Performed at [facility], [address], [place], [place] [address]    Report Status 08/20/2020 FINAL  Final  CSF culture     Status: None   Collection Time: 08/15/20  5:30 PM   Specimen: PATH Cytology CSF; Cerebrospinal Fluid  Result Value Ref Range Status   Specimen Description   Final    CSF Performed at [place] [address] [place], [place] [address]    Special Requests   Final    NONE Performed at [facility], [place] [address] [place], [place] [address]    Gram Stain   Final    NO ORGANISMS SEEN Gram Stain Report Called to,Read Back By and Verified With: [facility] RN AT [place] ON 08/15/20 BY S.VANHOORNE Performed at [facility], [place] [address], [place], [place] [address]    Culture   Final    NO GROWTH 3 DAYS Performed at [facility], [place] [address], [place], [place] [address]    Report Status 08/19/2020 FINAL  Final  Culture, fungus without smear     Status: None (Preliminary result)   Collection Time: 08/15/20  5:30 PM   Specimen: PATH Cytology CSF; Cerebrospinal Fluid  Result Value Ref Range Status   Specimen Description   Final    CSF Performed at [place] [address] [place], [place] [address]    Special Requests   Final    NONE Performed at [facility], [place] [address], [place], [place] [address]    Culture   Final    NO FUNGUS ISOLATED AFTER 6 DAYS Performed at [facility], [place] [address] [place], [place] [address]    Report Status PENDING  Incomplete      Studies: No results found.  Scheduled Meds: . amLODipine  10 mg Per Tube Daily  . chlorhexidine gluconate (MEDLINE KIT)  15 mL Mouth Rinse BID  . Chlorhexidine Gluconate Cloth  6 each Topical Daily  . cloNIDine  0.2 mg Transdermal Weekly  . enoxaparin (LOVENOX) injection  1 mg/kg Subcutaneous Q12H  .  feeding supplement (PROSource TF)  45 mL Per Tube BID  . free water  250 mL Per Tube Q6H  . gabapentin  300 mg Per Tube Q8H  . insulin aspart  0-20 Units Subcutaneous Q4H  . insulin glargine  15 Units Subcutaneous BID  . linagliptin  5 mg Per Tube Daily  . metoprolol tartrate  10 mg Intravenous Q6H  . nicotine  21 mg Transdermal Daily  . pantoprazole (PROTONIX) IV  40 mg Intravenous Q24H  . traZODone  50 mg Per Tube QHS    Continuous Infusions: . dexmedetomidine (PRECEDEX) IV infusion 0.4 mcg/kg/hr (08/22/20 0923)  . dextrose 45 mL/hr at 08/22/20 0300  . feeding supplement (OSMOLITE 1.2 CAL) Stopped (08/22/20 0300)  . lactated ringers Stopped (08/20/20 2303)  . levETIRAcetam 750 mg (08/22/20 0925)     LOS: 9 days     Kayleen Memos, MD [facility] Pager [phone]  If 7PM-7AM, please contact night-coverage [email or web address] Password [facility] 08/22/2020, 11:44 AM

## 2020-08-22 NOTE — Progress Notes (Signed)
[facility] Progress Note Patient Name: YERALDINE FORNEY DOB: [birth year] MRN: [id number]   Date of Service  08/22/2020  HPI/Events of Note  Patient with hypoglycemia on a D 10  % infusion @ 45 ml / hour.  eICU Interventions  D 10 % infusion rate increased to 75 ml / hour.        Kerry Kass Sadae Arrazola 08/22/2020, 8:36 PM

## 2020-08-22 NOTE — Progress Notes (Addendum)
NEUROLOGY PROGRESS NOTE   Subjective: Patient is extubated.  She shows frustration when her feet are tickled.  Daughter is in the room and noted that earlier she was laughing with her.  And today she is following some commands.  Exam: Vitals:   08/22/20 0900 08/22/20 1000  BP: 104/73 130/84  Pulse: 83 83  Resp: 17 17  Temp:    SpO2: 98% 99%     Neuro:  Mental Status: Very lethargic at this time, does show frustration when you tickle her feet.  Does follow commands such as looking left and right in addition to squeezing right hand and sticking her tongue out.  She even tried to vocalize stop when her toes were to cold. Cranial Nerves: II: Blinks to threat bilaterally III,IV, VI: EOMI intact to lateral gaze, bilaterally pupils equal, round, reactive to light and accommodation V,VII: Face symmetric VIII: hearing normal bilaterally    Medications:  Scheduled: . amLODipine  10 mg Per Tube Daily  . chlorhexidine gluconate (MEDLINE KIT)  15 mL Mouth Rinse BID  . Chlorhexidine Gluconate Cloth  6 each Topical Daily  . cloNIDine  0.2 mg Transdermal Weekly  . enoxaparin (LOVENOX) injection  1 mg/kg Subcutaneous Q12H  . feeding supplement (PROSource TF)  45 mL Per Tube BID  . free water  250 mL Per Tube Q6H  . gabapentin  300 mg Per Tube Q8H  . insulin aspart  0-20 Units Subcutaneous Q4H  . insulin glargine  15 Units Subcutaneous BID  . linagliptin  5 mg Per Tube Daily  . metoprolol tartrate  10 mg Intravenous Q6H  . nicotine  21 mg Transdermal Daily  . pantoprazole (PROTONIX) IV  40 mg Intravenous Q24H  . traZODone  50 mg Per Tube QHS   Continuous: . dexmedetomidine (PRECEDEX) IV infusion 0.4 mcg/kg/hr (08/22/20 0923)  . dextrose 45 mL/hr at 08/22/20 0300  . feeding supplement (OSMOLITE 1.2 CAL) Stopped (08/22/20 0300)  . lactated ringers Stopped (08/20/20 2303)  . levETIRAcetam 750 mg (08/22/20 0925)   VHQ:IONGEXBM, hydrALAZINE  Pertinent Labs/Diagnostics:   DG Abd 1  View  Result Date: 08/21/2020 CLINICAL DATA:  NG tube placement EXAM: ABDOMEN - 1 VIEW COMPARISON:  None. FINDINGS: Feeding tube extends the stomach. Tip appears in the first portion the duodenum. IMPRESSION: NG tube in stomach. Feeding tube with tip in the first portion the duodenum. Electronically Signed   By: Suzy Bouchard M.D.   On: 08/21/2020 11:28     Etta Quill PA-C [facility] [phone]  I have seen the patient and reviewed the above note.  Her exam today is markedly better than what I have seen previously, following commands and able to answer simple questions.  Assessment:  54 year old with encephalopathy in the setting of hyperglycemia, new onset seizure his exam is gradually improving.  The significance of her mild pleocytosis is unclear and this degree of pleocytosis can be seen seizure.  Though it is possible that her seizure was provoked, given her protracted course I would favor continuing antiepileptic therapy for now and repeat EEG in 1 to 2 months, if negative at that time, could wean as an outpatient.  I suspect that her tiny basal ganglia infarct was likely secondary to cocaine use, though would consider antiplatelet therapy and statin.  Impression: -New onset seizure-likely activity and fever of unknown source/hypoglycemia/polysubstance abuse  Recommendations: -Continue to normalize blood glucose -Continue Keppra 750 mg twice daily -Counseled cocaine cessation -Would favor antiplatelet therapy with aspirin, though stroke is  likely purely incidental, also would recommend starting statin if her LDL is greater than 70. -Neurology will be available on an as-needed basis moving forward, please call with further questions or concerns.   Roland Rack, MD [facility] [phone]  If 7pm- 7am, please page neurology on call as listed in [place].   08/22/2020, 11:10 AM

## 2020-08-22 NOTE — Progress Notes (Addendum)
[facility] Progress Note Patient Name: LORETTA DOUTT DOB: 11-23-1965 MRN: [id number]   Date of Service  08/22/2020  HPI/Events of Note  Agitation - Patient yelling out and pulled out NGT. BP = 179/114.  eICU Interventions  Plan: 1. Low dose Precedex IV infusion (0.2-0.6 mcg/kg/hour). Titrate to RASS = 0.  2. Follow BP with sedation before starting another agent.      Intervention Category Major Interventions: Delirium, psychosis, severe agitation - evaluation and management  Isolde Skaff [place] 08/22/2020, 2:01 AM

## 2020-08-23 ENCOUNTER — Inpatient Hospital Stay (HOSPITAL_COMMUNITY): Payer: Medicare Other

## 2020-08-23 DIAGNOSIS — J9600 Acute respiratory failure, unspecified whether with hypoxia or hypercapnia: Secondary | ICD-10-CM | POA: Diagnosis not present

## 2020-08-23 LAB — CBC
HCT: 40.9 % (ref 36.0–46.0)
Hemoglobin: 12.5 g/dL (ref 12.0–15.0)
MCH: 29.4 pg (ref 26.0–34.0)
MCHC: 30.6 g/dL (ref 30.0–36.0)
MCV: 96.2 fL (ref 80.0–100.0)
Platelets: 246 10*3/uL (ref 150–400)
RBC: 4.25 MIL/uL (ref 3.87–5.11)
RDW: 14.1 % (ref 11.5–15.5)
WBC: 8.1 10*3/uL (ref 4.0–10.5)
nRBC: 0 % (ref 0.0–0.2)

## 2020-08-23 LAB — BASIC METABOLIC PANEL
Anion gap: 11 (ref 5–15)
BUN: 24 mg/dL — ABNORMAL HIGH (ref 6–20)
CO2: 22 mmol/L (ref 22–32)
Calcium: 8.6 mg/dL — ABNORMAL LOW (ref 8.9–10.3)
Chloride: 110 mmol/L (ref 98–111)
Creatinine, Ser: 1.33 mg/dL — ABNORMAL HIGH (ref 0.44–1.00)
GFR, Estimated: 47 mL/min — ABNORMAL LOW (ref >60)
Glucose, Bld: 146 mg/dL — ABNORMAL HIGH (ref 70–99)
Potassium: 4.1 mmol/L (ref 3.5–5.1)
Sodium: 143 mmol/L (ref 135–145)

## 2020-08-23 LAB — GLUCOSE, CAPILLARY
Glucose-Capillary: 132 mg/dL — ABNORMAL HIGH (ref 70–99)
Glucose-Capillary: 131 mg/dL — ABNORMAL HIGH (ref 70–99)
Glucose-Capillary: 167 mg/dL — ABNORMAL HIGH (ref 70–99)
Glucose-Capillary: 161 mg/dL — ABNORMAL HIGH (ref 70–99)
Glucose-Capillary: 188 mg/dL — ABNORMAL HIGH (ref 70–99)

## 2020-08-23 LAB — LIPID PANEL
Cholesterol: 240 mg/dL — ABNORMAL HIGH (ref 0–200)
HDL: 34 mg/dL — ABNORMAL LOW (ref >40)
LDL Cholesterol: 180 mg/dL — ABNORMAL HIGH (ref 0–99)
Total CHOL/HDL Ratio: 7.1 RATIO
Triglycerides: 131 mg/dL (ref <150)
VLDL: 26 mg/dL (ref 0–40)

## 2020-08-23 MED ORDER — METOPROLOL TARTRATE 5 MG/5ML IV SOLN
5.0000 mg | Freq: Four times a day (QID) | INTRAVENOUS | Status: DC | PRN
  Administered 2020-08-23 – 2020-08-24 (×5): 5 mg via INTRAVENOUS
  Filled 2020-08-23 (×6): qty 5

## 2020-08-23 MED ORDER — MONTELUKAST SODIUM 10 MG PO TABS
10.0000 mg | ORAL_TABLET | Freq: Every day | ORAL | Status: DC
  Administered 2020-08-25 – 2020-08-27 (×3): 10 mg via ORAL
  Filled 2020-08-23 (×3): qty 1

## 2020-08-23 MED ORDER — ROSUVASTATIN CALCIUM 10 MG PO TABS
10.0000 mg | ORAL_TABLET | Freq: Every day | ORAL | Status: DC
  Administered 2020-08-23 – 2020-08-28 (×4): 10 mg via ORAL
  Filled 2020-08-23 (×4): qty 1

## 2020-08-23 MED ORDER — HYDROXYZINE HCL 25 MG PO TABS
25.0000 mg | ORAL_TABLET | Freq: Every day | ORAL | Status: DC
  Administered 2020-08-25 – 2020-08-26 (×2): 25 mg via ORAL
  Filled 2020-08-23 (×2): qty 1

## 2020-08-23 MED ORDER — METOPROLOL SUCCINATE ER 25 MG PO TB24
50.0000 mg | ORAL_TABLET | Freq: Two times a day (BID) | ORAL | Status: DC
  Administered 2020-08-23: 50 mg via ORAL
  Filled 2020-08-23: qty 2

## 2020-08-23 MED ORDER — EZETIMIBE 10 MG PO TABS
10.0000 mg | ORAL_TABLET | Freq: Every day | ORAL | Status: DC
  Administered 2020-08-23 – 2020-08-28 (×4): 10 mg via ORAL
  Filled 2020-08-23 (×6): qty 1

## 2020-08-23 NOTE — Progress Notes (Signed)
Attempted to place panda x2. Pt refusing and shaking head. MD made aware

## 2020-08-23 NOTE — Progress Notes (Addendum)
PROGRESS NOTE  [name] VEL:[id number] DOB: 12/09/1965 DOA: 08/13/2020 PCP: [name], MD  HPI/Recap of past 18 hours: 54 year old African-American female with past medical history significant for essential hypertension, chronic anxiety/depression, asthma, hyperlipidemia, type 2 diabetes, polyneuropathy, paroxysmal A. fib on Eliquis, chronic combined diastolic and systolic CHF, prior CVA, CKD stage III A, polysubstance abuse including cocaine who presented to [name] ED after she was found unresponsive by her boyfriend. She was last seen well earlier in the evening of 08/13/2020. EMS was called and found her with seizure activity that was intermittent. In the ED she was intubated to protect her airway. She was seen by teleneurology with recommendation to continue Keppra 1 g twice daily and to obtain routine EEG as well of MRI brain with and without contrast. CT head without contrast showed no acute findings. PCCM was called and accepted the patient to their service.  No beds at [facility].  She was subsequently transferred to [facility] long for further management.   UDS + cocaine 08/13/20.  Extubated on 08/20/20.  NG tube placed due to concern for dysphagia.  Patient became very agitated on 08/22/20 removed her NG tube.  She was placed on Precedex briefly due to severe agitation.  Precedex is currently off.  08/23/20: Patient seen and examined at bedside.  No acute events overnight.  She is much calmer today.  She failed her swallow evaluation by speech therapist.  MBS completed on 08/23/2020, high risk for aspiration, recommendation made for n.p.o. from speech therapist.  Attempted to contact family via phone several times unsuccessfully.  Will likely require PEG tube placement.  Assessment/Plan: Active Problems:   Acute respiratory failure requiring reintubation (HCC)   Lactic acid acidosis   AKI (acute kidney injury) ([place])   Atrial fibrillation with rapid ventricular response  (HCC)   Pressure injury of skin   Respiratory failure ([place])   Grand mal seizure (HCC)   Fever  Improving toxic metabolic encephalopathy likely multifactorial secondary to seizure, hypoglycemia, acute right basal ganglia stroke, cocaine Required Precedex on 08/22/2020, currently off Currently off Precedex. Continue Keppra Reorient as needed Delirium precautions in place.  Severe dysphagia Failed bedside swallow evaluation by speech therapy MBS completed on 08/23/2020, recommendation for n.p.o. due to high risk for aspiration Continue aspiration precautions. [facility] team consulted 08/23/20  Hypoglycemia due to poor oral intake Currently n.p.o. due to severe dysphagia She has been hypoglycemic and requiring D10 infusion Continue to monitor CBG every 4 hours  New onset seizure in the setting of acute right basal ganglia stroke Seen by neurology Continue Keppra Continue seizure precautions  Acute right basal ganglia stroke PT OT/speech consulted and following Fall and aspiration precautions in place.  Paroxysmal A. fib On Eliquis prior to admission, n.p.o. Currently on full dose subcu Lovenox twice daily  Essential hypertension BP is not at goal Continue home oral antihypertensives Continue to monitor vital signs  Type 2 diabetes, currently hypoglycemic requiring D10 infusion Hemoglobin A1c 13.8 on 08/14/2020. DC insulin for now  Polysubstance abuse UDS positive for cocaine 08/13/2020 Tobacco use disorder Polysubstance abuse cessation counseling when more alert  Physical debility PT OT recommended SNF Fall precautions TOC consulted to assist with SNF placement.  Code Status: Full code  Family Communication: Daughter at bedside on 08/22/2020.  Called her brother and sister with numbers in the chart on 08/23/2020, no answer.  Disposition Plan: SNF when medically  stable   Consultants:  Neurology  PCCM  Procedures: Intubation Extubation MBS  Antimicrobials:  None  DVT prophylaxis:  Full dose Lovenox subcu twice daily.  Status is: Inpatient    Dispo:  Patient From: Home  Planned Disposition:SNF.              Expected discharge date: 08/25/20  Medically stable for discharge: No, ongoing management of new onset seizure and severe dysphagia.         Objective: Vitals:   08/23/20 0724 08/23/20 1153 08/23/20 1200 08/23/20 1247  BP:  (!) 202/109 (!) 150/93   Pulse:   (!) 102   Resp:      Temp: 98.9 F (37.2 C)   98.5 F (36.9 C)  TempSrc: Oral   Oral  SpO2:      Weight:      Height:        Intake/Output Summary (Last 24 hours) at 08/23/2020 1435 Last data filed at 08/23/2020 1330 Gross per 24 hour  Intake 926.11 ml  Output 1700 ml  Net -773.89 ml   Filed Weights   08/20/20 0445 08/22/20 0500 08/23/20 0426  Weight: 100.1 kg 100.8 kg 101 kg    Exam:  . General: 54 y.o. year-old female pleasant well-developed well-nourished in no acute distress.  Alert and interactive.   . Cardiovascular: Regular rate and rhythm no rubs or gallops.   Marland Kitchen Respiratory: Mild rales at bases no wheezing noted.  Poor inspiratory effort.   . Abdomen: Soft nontender normal bowel sounds present.  . Musculoskeletal: No lower extremity edema bilaterally. Marland Kitchen Psychiatry: Mood is appropriate for condition and setting.  Data Reviewed: CBC: Recent Labs  Lab 08/19/20 0301 08/20/20 0304 08/21/20 0234 08/22/20 0226 08/23/20 0947  WBC 10.7* 9.8 12.6* 9.6 8.1  HGB 13.4 13.0 12.8 12.6 12.5  HCT 43.1 42.0 41.8 41.2 40.9  MCV 95.6 95.5 95.9 96.3 96.2  PLT 179 148* 181 247 607   Basic Metabolic Panel: Recent Labs  Lab 08/16/20 1636 08/17/20 0253 08/17/20 1656 08/19/20 0301 08/20/20 0304 08/21/20 0228 08/23/20 0947  NA  --  147*  --  144 144 144 143  K  --  3.1*  --  3.1* 4.2 4.6 4.1  CL  --  116*  --  114* 113* 114* 110  CO2  --   19*  --  20* 20* 21* 22  GLUCOSE  --  201*  --  303* 263* 83 146*  BUN  --  20  --  22* 26* 29* 24*  CREATININE  --  1.86*  --  1.61* 1.70* 1.52* 1.33*  CALCIUM  --  8.5*  --  8.4* 8.6* 8.7* 8.6*  MG 1.6* 1.8 1.9  --   --  1.9  --   PHOS 3.2 3.0 2.4*  --   --   --   --    GFR: Estimated Creatinine Clearance: 51.5 mL/min (A) (by C-G formula based on SCr of 1.33 mg/dL (H)). Liver Function Tests: Recent Labs  Lab 08/20/20 0304  AST 8*  ALT 7  ALKPHOS 56  BILITOT 0.5  PROT 5.2*  ALBUMIN 1.8*   No results for input(s): LIPASE, AMYLASE in the last 168 hours. No results for input(s): AMMONIA in the last 168 hours. Coagulation Profile: No results for input(s): INR, PROTIME in the last 168 hours. Cardiac Enzymes: No results for input(s): CKTOTAL, CKMB, CKMBINDEX, TROPONINI in the last 168 hours. BNP (last 3 results) No results for input(s): PROBNP in the last 8760 hours. HbA1C: No results for input(s): HGBA1C in the last 72  hours. CBG: Recent Labs  Lab 08/22/20 2206 08/22/20 2344 08/23/20 0346 08/23/20 0724 08/23/20 1247  GLUCAP 103* 105* 132* 131* 167*   Lipid Profile: Recent Labs    08/23/20 0248  CHOL 240*  HDL 34*  LDLCALC 180*  TRIG 131  CHOLHDL 7.1   Thyroid Function Tests: No results for input(s): TSH, T4TOTAL, FREET4, T3FREE, THYROIDAB in the last 72 hours. Anemia Panel: No results for input(s): VITAMINB12, FOLATE, FERRITIN, TIBC, IRON, RETICCTPCT in the last 72 hours. Urine analysis:    Component Value Date/Time   COLORURINE STRAW (A) 08/13/2020 2027   APPEARANCEUR CLEAR 08/13/2020 2027   LABSPEC 1.015 08/13/2020 2027   PHURINE 7.0 08/13/2020 2027   GLUCOSEU >=500 (A) 08/13/2020 2027   HGBUR SMALL (A) 08/13/2020 2027   BILIRUBINUR NEGATIVE 08/13/2020 2027   KETONESUR NEGATIVE 08/13/2020 2027   PROTEINUR >300 (A) 08/13/2020 2027   UROBILINOGEN 0.2 09/11/2014 1319   NITRITE NEGATIVE 08/13/2020 2027   LEUKOCYTESUR NEGATIVE 08/13/2020 2027   Sepsis  Labs: '@LABRCNTIP' (procalcitonin:4,lacticidven:4)  ) Recent Results (from the past 240 hour(s))  Respiratory Panel by RT PCR (Flu A&B, Covid) - Nasopharyngeal Swab     Status: None   Collection Time: 08/13/20  8:44 PM   Specimen: Nasopharyngeal Swab  Result Value Ref Range Status   SARS Coronavirus 2 by RT PCR NEGATIVE NEGATIVE Final    Comment: (NOTE) SARS-CoV-2 target nucleic acids are NOT DETECTED.  The SARS-CoV-2 RNA is generally detectable in upper respiratoy specimens during the acute phase of infection. The lowest concentration of SARS-CoV-2 viral copies this assay can detect is 131 copies/mL. A negative result does not preclude SARS-Cov-2 infection and should not be used as the sole basis for treatment or other patient management decisions. A negative result may occur with  improper specimen collection/handling, submission of specimen other than nasopharyngeal swab, presence of viral mutation(s) within the areas targeted by this assay, and inadequate number of viral copies (<131 copies/mL). A negative result must be combined with clinical observations, patient history, and epidemiological information. The expected result is Negative.  Fact Sheet for Patients:  PinkCheek.be  Fact Sheet for Healthcare Providers:  GravelBags.it  This test is no t yet approved or cleared by the [place] FDA and  has been authorized for detection and/or diagnosis of SARS-CoV-2 by FDA under an Emergency Use Authorization (EUA). This EUA will remain  in effect (meaning this test can be used) for the duration of the COVID-19 declaration under Section 564(b)(1) of the Act, 21 U.S.C. section 360bbb-3(b)(1), unless the authorization is terminated or revoked sooner.     Influenza A by PCR NEGATIVE NEGATIVE Final   Influenza B by PCR NEGATIVE NEGATIVE Final    Comment: (NOTE) The Xpert Xpress SARS-CoV-2/FLU/RSV assay is intended as an  aid in  the diagnosis of influenza from Nasopharyngeal swab specimens and  should not be used as a sole basis for treatment. Nasal washings and  aspirates are unacceptable for Xpert Xpress SARS-CoV-2/FLU/RSV  testing.  Fact Sheet for Patients: PinkCheek.be  Fact Sheet for Healthcare Providers: GravelBags.it  This test is not yet approved or cleared by the [place] FDA and  has been authorized for detection and/or diagnosis of SARS-CoV-2 by  FDA under an Emergency Use Authorization (EUA). This EUA will remain  in effect (meaning this test can be used) for the duration of the  Covid-19 declaration under Section 564(b)(1) of the Act, 21  U.S.C. section 360bbb-3(b)(1), unless the authorization is  terminated or revoked. Performed  at [facility], [address] [place], [place] [address]   Blood Culture (routine x 2)     Status: None   Collection Time: 08/13/20 11:50 PM   Specimen: BLOOD RIGHT HAND  Result Value Ref Range Status   Specimen Description BLOOD RIGHT HAND  Final   Special Requests   Final    AEROBIC BOTTLE ONLY Blood Culture results may not be optimal due to an inadequate volume of blood received in culture bottles   Culture   Final    NO GROWTH 5 DAYS Performed at [facility], [address] [place], [place] [address]    Report Status 08/19/2020 FINAL  Final  Blood Culture (routine x 2)     Status: None   Collection Time: 08/14/20 12:02 AM   Specimen: BLOOD RIGHT HAND  Result Value Ref Range Status   Specimen Description BLOOD RIGHT HAND  Final   Special Requests   Final    AEROBIC BOTTLE ONLY Blood Culture results may not be optimal due to an inadequate volume of blood received in culture bottles   Culture   Final    NO GROWTH 5 DAYS Performed at [facility], [address] [place], [place] [address]    Report Status 08/19/2020 FINAL  Final  MRSA PCR Screening     Status: None   Collection Time:  08/14/20  6:54 PM   Specimen: Nasal Mucosa; Nasopharyngeal  Result Value Ref Range Status   MRSA by PCR NEGATIVE NEGATIVE Final    Comment:        The GeneXpert MRSA Assay (FDA approved for NASAL specimens only), is one component of a comprehensive MRSA colonization surveillance program. It is not intended to diagnose MRSA infection nor to guide or monitor treatment for MRSA infections. Performed at [facility], [place] [address], [place], [place] [address]   Anaerobic culture     Status: None   Collection Time: 08/15/20  5:30 PM   Specimen: PATH Cytology CSF; Cerebrospinal Fluid  Result Value Ref Range Status   Specimen Description   Final    CSF Performed at [facility] [address], [place], [place] [address]    Special Requests   Final    NONE Performed at [facility], [place] [address], [place], [place] [address]    Gram Stain   Final    WBC PRESENT,BOTH PMN AND MONONUCLEAR RED BLOOD CELLS PRESENT NO ORGANISMS SEEN CYTOSPIN SMEAR    Culture   Final    NO ANAEROBES ISOLATED Performed at [facility], [address] [place], [place] [address]    Report Status 08/20/2020 FINAL  Final  CSF culture     Status: None   Collection Time: 08/15/20  5:30 PM   Specimen: PATH Cytology CSF; Cerebrospinal Fluid  Result Value Ref Range Status   Specimen Description   Final    CSF Performed at [facility] [address] [place], [place] [address]    Special Requests   Final    NONE Performed at [facility], [place] [address], [place], [place] [address]    Gram Stain   Final    NO ORGANISMS SEEN Gram Stain Report Called to,Read Back By and Verified With: [facility] RN AT [place] ON 08/15/20 BY S.VANHOORNE Performed at [facility], [place] [address], [place], [place] [address]    Culture   Final    NO GROWTH 3 DAYS Performed  at [facility], [place]  [address] [place], [place] [address]    Report Status 08/19/2020 FINAL  Final  Culture, fungus without smear     Status: None (Preliminary result)   Collection Time: 08/15/20  5:30 PM   Specimen: PATH Cytology CSF; Cerebrospinal Fluid  Result Value Ref Range Status   Specimen Description   Final    CSF Performed at [place] [address], [place], [place] [address]    Special Requests   Final    NONE Performed at [facility], [place] [address], [place], [place] [address]    Culture   Final    NO FUNGUS ISOLATED AFTER 7 DAYS Performed at [facility], [place] [address] [place], [place] [address]    Report Status PENDING  Incomplete      Studies: DG Swallowing Func-Speech Pathology  Result Date: 08/23/2020 Objective Swallowing Evaluation: Type of Study: MBS-Modified Barium Swallow Study  Patient Details Name: [name] MRN: [id number] Date of Birth: [birth year] Today's Date: 08/23/2020 Time: SLP Start Time (ACUTE ONLY): 1315 -SLP Stop Time (ACUTE ONLY): 1345 SLP Time Calculation (min) (ACUTE ONLY): 30 min Past Medical History: Past Medical History: Diagnosis Date . Alcohol use  . Ankle fracture, lateral malleolus, closed 12/30/2011 . Anxiety  . Breast mass, left 12/15/2011 . Chronic anemia  . Chronic combined systolic and diastolic CHF (congestive heart failure) ([place])  . CKD (chronic kidney disease), stage III ([place])   pt denies . Cocaine abuse ([place])  . COPD (chronic obstructive pulmonary disease) ([place])  . Diabetes mellitus, type 2 ([place])  . Diabetic Charcot foot ([place]) 12/30/2011 . Essential hypertension  . History of GI bleed 12/05/2009  Qualifier: Diagnosis of  By: Zeb Comfort   . Hyperlipidemia  . Hypertensive cardiomyopathy ([place]) 11/08/2012  a. 05/2016: echo showing EF of 20-25% with normal cors by cath in 07/2016. . Morbid obesity ([place]) 01/31/2008  Qualifier: Diagnosis of  By: Lenn Cal   . Noncompliance with medication regimen  . Obesity  . Panic  attacks  . PAT (paroxysmal atrial tachycardia) ([place])   a. started on amiodarone 07/2017 for this. . Sleep apnea   does not use cpap . Stroke ([place])   06/24/17 . Tobacco abuse  . Urinary incontinence  Past Surgical History: Past Surgical History: Procedure Laterality Date . BREAST BIOPSY   . CARDIAC CATHETERIZATION N/A 07/28/2016  Procedure: Left Heart Cath and Coronary Angiography;  Surgeon: Jettie Booze, MD;  Location: [facility];  Service: Cardiovascular;  Laterality: N/A; . COLONOSCOPY N/A 05/10/2019  Procedure: COLONOSCOPY;  Surgeon: Danie Binder, MD;  [facility];  Service: Endoscopy;  Laterality: N/A;  Phenergan 12.5 mg IV in pre-op . DILATION AND CURETTAGE OF UTERUS   . I & D EXTREMITY Bilateral 09/22/2017  Procedure: BILATERAL DEBRIDEMENT LEG/FOOT ULCERS, APPLY VERAFLO WOUND VAC;  Surgeon: Newt Minion, MD;  Location: [facility];  Service: Orthopedics;  Laterality: Bilateral; . I & D EXTREMITY Right 10/11/2018  Procedure: IRRIGATION AND DEBRIDEMENT RIGHT HAND;  Surgeon: [name], MD;  Location: [facility];  Service: Orthopedics;  Laterality: Right; . I & D EXTREMITY Right 10/13/2018  Procedure: REPEAT IRRIGATION AND DEBRIDEMENT RIGHT HAND;  Surgeon: [name], MD;  Location: [facility];  Service: Orthopedics;  Laterality: Right; . I & D EXTREMITY Right 11/22/2018  Procedure: IRRIGATION AND DEBRIDEMENT AND PINNING RIGHT HAND;  Surgeon: [name], MD;  Location: [facility];  Service: Orthopedics;  Laterality: Right; . IR RADIOLOGIST EVAL & MGMT  07/05/2018 . POLYPECTOMY  05/10/2019  Procedure: POLYPECTOMY;  Surgeon: Danie Binder, MD;  [facility];  Service: Endoscopy;; . SKIN SPLIT GRAFT Bilateral 09/28/2017  Procedure: BILATERAL SPLIT THICKNESS SKIN GRAFT LEGS/FEET AND APPLY VAC;  Surgeon: Newt Minion, MD;  Location: [facility];  Service: Orthopedics;  Laterality: Bilateral; . SKIN SPLIT GRAFT Right 11/22/2018  Procedure: SKIN GRAFT SPLIT THICKNESS;  Surgeon: [name], MD;  Location: [facility];  Service: Orthopedics;  Laterality: Right; HPI: 54yo female admitted 08/14/20 with seizures, found unresponsive. PMH: alcohol/cocaine use, anxiety, anemia, CHF, CKD, COPD, DM, essential HTN, GIB, HLD, cardiomyopathy, obesity, PAT (paroxysmal atrial tachycardia), OSA, CVA (2018).  Intubated 10/19-26/21. MRI = subtle acute/early subacute infarct Right basal ganglia. Numerous bilateral prior basal ganglia and thalamic microhemorrhages with additional larger area of prior hemorrhage. Small remote bilateral cerebellar lacunar infarcts.  Subjective: Pt seen in radiology for MBS Assessment / Plan / Recommendation CHL IP CLINICAL IMPRESSIONS 08/23/2020 Clinical Impression Pt seen for MBS to objectively assess swallow function and safety, and to identify least restrictive diet. Pt was presented with trials of nectar thick liquid, honey thick liquid, and puree. ORAL: Pt presents with functional oral swallow, for the consistencies tested. No anterior leakage or oral residue on any texture given. Solid textures were not assessed at this time, given high aspiration risk. PHARYNGEAL: Pt exhibits moderate to severe pharyngeal dysphagia. Swallow reflex was noted to occur at the vallecular sinus across consistencies. Poor epiglottic inversion was seen, which resulted in post swallow residue in the vallecular sinus. This was then observed to spill over into the laryngeal vestibule after the swallow. Wet voice quality was noted after a slight delay, with pt then producing a weak, inefficient throat clear. Volitional cough was delayed, likely after aspiration had occurred. Pt required cues to dry swallow to decrease post-swallow vallecular residue. Pt risk of aspiration of a full meal's worth of puree and even thickened liquid is significantly high. She was unable to demonstrate volitional cough effective enough to clear penetrate. At this time, NPO status continues to be recommended, with consideration  of short term nonoral feeding method. SLP will continue to follow for dysphagia therapy, PO trials, and to determine readiness for PO intake and/or repeat instrumental assessment. MD and RN notified of results and recommendations.  SLP Visit Diagnosis Dysphagia, pharyngeal phase (R13.13)     Impact on safety and function Severe aspiration risk;Risk for inadequate nutrition/hydration   CHL IP TREATMENT RECOMMENDATION 08/23/2020 Treatment Recommendations Therapy as outlined in treatment plan below   Prognosis 08/23/2020 Prognosis for Safe Diet Advancement Fair Barriers to Reach Goals Severity of deficits   CHL IP DIET RECOMMENDATION 08/23/2020 SLP Diet Recommendations NPO   Medication Administration Via alternative means   CHL IP OTHER RECOMMENDATIONS 08/23/2020   Oral Care Recommendations Oral care QID Other Recommendations Have oral suction available   CHL IP FOLLOW UP RECOMMENDATIONS 08/23/2020 Follow up Recommendations 24 hour supervision/assistance   CHL IP FREQUENCY AND DURATION 08/23/2020 Speech Therapy Frequency (ACUTE ONLY) min 2x/week Treatment Duration 1 week;2 weeks      CHL IP ORAL PHASE 08/23/2020 Oral Phase WFL for thickened liquids and puree textures    CHL IP PHARYNGEAL PHASE 08/23/2020 Pharyngeal Phase Impaired  Swallow reflex trigger at vallecular sinus Decreased epiglottic inversion Vallecular residue Penetration after the swallow from vallecular residue Reflexive cough delayed - suspect after aspiration had occurred.   CHL IP CERVICAL ESOPHAGEAL PHASE 08/23/2020 Cervical  Esophageal Phase [facility]   Celia B. [name] Ore, [facility], [place] Speech Language Pathologist Office: [phone] Pager: [phone] Shonna Chock 08/23/2020, 2:06 PM               Scheduled Meds: . amLODipine  10 mg Per Tube Daily  . chlorhexidine gluconate (MEDLINE KIT)  15 mL Mouth Rinse BID  . Chlorhexidine Gluconate Cloth  6 each Topical Daily  . cloNIDine  0.2 mg Transdermal Weekly  . enoxaparin (LOVENOX) injection  1  mg/kg Subcutaneous Q12H  . ezetimibe  10 mg Oral Daily  . feeding supplement (PROSource TF)  45 mL Per Tube BID  . free water  250 mL Per Tube Q6H  . gabapentin  300 mg Per Tube Q8H  . hydrOXYzine  25 mg Oral QHS  . linagliptin  5 mg Per Tube Daily  . metoprolol succinate  50 mg Oral BID  . montelukast  10 mg Oral QHS  . nicotine  21 mg Transdermal Daily  . pantoprazole (PROTONIX) IV  40 mg Intravenous Q24H  . pneumococcal 23 valent vaccine  0.5 mL Intramuscular Tomorrow-1000  . rosuvastatin  10 mg Oral Daily  . traZODone  50 mg Per Tube QHS    Continuous Infusions: . dexmedetomidine (PRECEDEX) IV infusion Stopped (08/22/20 1011)  . dextrose 75 mL/hr at 08/23/20 0400  . feeding supplement (OSMOLITE 1.2 CAL) Stopped (08/22/20 0300)  . lactated ringers Stopped (08/20/20 2303)  . levETIRAcetam Stopped (08/23/20 1250)     LOS: 10 days     Kayleen Memos, MD [facility] Pager [phone]  If 7PM-7AM, please contact night-coverage [email or web address] Password [email or web address] 08/23/2020, 2:35 PM

## 2020-08-23 NOTE — Progress Notes (Signed)
  Speech Language Pathology Treatment: Dysphagia  Patient Details Name: [name] MRN: [id number] DOB: 02/16/1966 Today's Date: 08/23/2020 Time: 1130-1150 SLP Time Calculation (min) (ACUTE ONLY): 20 min  Assessment / Plan / Recommendation Clinical Impression  Pt seen at bedside to determine readiness for PO intake. Pt more alert, and voice quality continues to improve. However, pt's speech is significantly dysarthric, raising concern for oral swallow deficits. Wet voice and cough response noted after ice chips and thin liquid via cup. Puree texture appeared to be tolerated well, however, pt is at very high risk of aspiration given length of intubation, decreased secretion management, and dysarthric speech. Instrumental assessment via MBS is recommended prior to recommendation of PO diet. Will complete in radiology this date. RN and MD informed.    HPI HPI: 54yo female admitted 08/14/20 with seizures, found unresponsive. PMH: alcohol/cocaine use, anxiety, anemia, CHF, CKD, COPD, DM, essential HTN, GIB, HLD, cardiomyopathy, obesity, PAT (paroxysmal atrial tachycardia), OSA, CVA (2018).  Intubated 10/19-26/21. MRI = subtle acute/early subacute infarct Right basal ganglia. Numerous bilateral prior basal ganglia and thalamic microhemorrhages with additional larger area of prior hemorrhage. Small remote bilateral cerebellar lacunar infarcts.      SLP Plan  MBS       Recommendations  Diet recommendations: NPO Medication Administration: Via alternative means                Oral Care Recommendations: Oral care QID;Staff/trained caregiver to provide oral care Follow up Recommendations: Other (comment) (TBD) SLP Visit Diagnosis: Dysphagia, unspecified (R13.10) Plan: MBS       GO               Hovanes Hymas B. [name] Ore, [facility], [place] Speech Language Pathologist Office: [phone] Pager: [phone]  Shonna Chock 08/23/2020, 11:52 AM

## 2020-08-23 NOTE — Evaluation (Signed)
Physical Therapy Evaluation Patient Details Name: JOSEPH JOHNS MRN: [id number] DOB: 1966/07/18 Today's Date: 08/23/2020   History of Present Illness  HPI: 54yo female admitted 08/14/20 with seizures, found unresponsive. PMH: alcohol/cocaine use, anxiety, anemia, CHF, CKD, COPD, DM, essential HTN, GIB, HLD, cardiomyopathy, obesity, PAT (paroxysmal atrial tachycardia), OSA, CVA (2018).  Intubated 10/19-26/21. MRI = subtle acute/early subacute infarct Right basal ganglia. Numerous bilateral prior basal ganglia and thalamic microhemorrhages with additional larger area of prior hemorrhage. Small remote bilateral cerebellar lacunar infarcts.  Clinical Impression  The patient presents with L hemiparesis, noted active movement in U and LE but\ very minimal, Patient does endorse LT on LU/LE.Marland Kitchen Patient also noted to have right gaze preference but will attend to left if cued to do so.Patient participated in assisted sitting with bed placed in bed/chair position. Patient's HR ranged from 99-156 intermittent. BP in semi sitting 184/117, RN in room and aware. Placed HOB down and BP  198/109. SPO2 on RA 93% . Pt admitted with above diagnosis.   Pt currently with functional limitations due to the deficits listed below (see PT Problem List). Pt will benefit from skilled PT to increase their independence and safety with mobility to allow discharge to the venue listed below.       Follow Up Recommendations SNF;Supervision/Assistance - 24 hour    Equipment Recommendations  None recommended by PT    Recommendations for Other Services       Precautions / Restrictions Precautions Precautions: Fall Precaution Comments: L hemiparesis      Mobility  Bed Mobility Overal bed mobility: Needs Assistance Bed Mobility: Rolling Rolling: Total assist;+2 for physical assistance;+2 for safety/equipment         General bed mobility comments: placed Bed in chair position. Assisted patient to lean forward, HOB  lowered and patient able to remain sitting forward x 1 minute. Patient reaching towards right foot activiely, required max assistance to reach with left UE.    Transfers                 General transfer comment: NT will need mechanical lift  Ambulation/Gait                Stairs            Wheelchair Mobility    Modified Rankin (Stroke Patients Only)       Balance Overall balance assessment: Needs assistance     Sitting balance - Comments: in bed chair position with max assist to lean forward, able to maintain x 1 minute with no back support Postural control: Right lateral lean                                   Pertinent Vitals/Pain Pain Assessment: No/denies pain Pain Score: 8  Pain Location: butt Pain Descriptors / Indicators: Discomfort Pain Intervention(s): Limited activity within patient's tolerance;Monitored during session;Repositioned    Home Living Family/patient expects to be discharged to:: Private residence Living Arrangements: Spouse/significant other     Home Access: Stairs to enter   Technical brewer of Steps: 4 Home Layout: One level Home Equipment: Walker - 4 wheels Additional Comments: pt. provided some info, ? if accurate.    Prior Function           Comments: pt reports using rollator and gets  help from family , likes to cook     Hand Dominance   Dominant Hand: Right  Extremity/Trunk Assessment   Upper Extremity Assessment Upper Extremity Assessment: Defer to OT evaluation RUE Deficits / Details: grossly trace for finger and wrist flex and ext, noted active elb flex and ext,and shoulder flexion. LT is intact.    Lower Extremity Assessment Lower Extremity Assessment: LLE deficits/detail LLE Deficits / Details: trace toe flex and ext, ankle dorsiflex /plantar flexion,  2+ knee ext and 2+ hip flexion and int rotation. LLE Sensation: WNL    Cervical / Trunk Assessment Cervical / Trunk  Assessment:  (right gaze/head turn to right preference, can rotate and look to left with cues)  Communication   Communication: Expressive difficulties (speech is slurred)  Cognition Arousal/Alertness: Awake/alert Behavior During Therapy: WFL for tasks assessed/performed Overall Cognitive Status: Impaired/Different from baseline Area of Impairment: Orientation;Attention;Memory;Following commands;Awareness                 Orientation Level: Place;Time Current Attention Level: Focused Memory: Decreased short-term memory Following Commands: Follows one step commands consistently   Awareness: Intellectual   General Comments: Oriented  to" I had a stroke", not place or time      General Comments      Exercises     Assessment/Plan    PT Assessment Patient needs continued PT services  PT Problem List Decreased strength;Decreased cognition;Decreased activity tolerance;Decreased safety awareness;Decreased knowledge of precautions;Decreased balance;Decreased mobility;Cardiopulmonary status limiting activity       PT Treatment Interventions Functional mobility training;Therapeutic activities;Therapeutic exercise;Patient/family education;Cognitive remediation;Neuromuscular re-education;Balance training    PT Goals (Current goals can be found in the Care Plan section)  Acute Rehab PT Goals Patient Stated Goal: i want to get up and go PT Goal Formulation: Patient unable to participate in goal setting Time For Goal Achievement: 09/06/20 Potential to Achieve Goals: Fair    Frequency Min 2X/week   Barriers to discharge        Co-evaluation               AM-PAC PT "6 Clicks" Mobility  Outcome Measure Help needed turning from your back to your side while in a flat bed without using bedrails?: Total Help needed moving from lying on your back to sitting on the side of a flat bed without using bedrails?: Total Help needed moving to and from a bed to a chair (including a  wheelchair)?: Total Help needed standing up from a chair using your arms (e.g., wheelchair or bedside chair)?: Total Help needed to walk in hospital room?: Total Help needed climbing 3-5 steps with a railing? : Total 6 Click Score: 6    End of Session   Activity Tolerance: Patient tolerated treatment well Patient left: in bed;with call bell/phone within reach Nurse Communication: Mobility status;Need for lift equipment PT Visit Diagnosis: Other symptoms and signs involving the nervous system (R29.898);Hemiplegia and hemiparesis Hemiplegia - Right/Left: Left Hemiplegia - dominant/non-dominant: Non-dominant Hemiplegia - caused by: Nontraumatic intracerebral hemorrhage;Cerebral infarction    Time: 1694-5038 PT Time Calculation (min) (ACUTE ONLY): 41 min   Charges:   PT Evaluation $PT Eval Moderate Complexity: 1 Mod PT Treatments $Neuromuscular Re-education: 23-37 mins        Tresa Endo PT Acute Rehabilitation Services Pager [phone] Office [phone]   Claretha Cooper 08/23/2020, 1:07 PM

## 2020-08-23 NOTE — Progress Notes (Signed)
Modified Barium Swallow Progress Note  Patient Details  Name: [name] MRN: [id number] Date of Birth: Oct 30, 1965  Today's Date: 08/23/2020  Modified Barium Swallow completed.  Full report located under Chart Review in the Imaging Section.  Brief recommendations include the following:  Clinical Impression Pt seen for MBS to objectively assess swallow function and safety, and to identify least restrictive diet. Pt was presented with trials of nectar thick liquid, honey thick liquid, and puree.  ORAL: Pt presents with functional oral swallow, for the consistencies tested. No anterior leakage or oral residue on any texture given. Solid textures were not assessed at this time, given high aspiration risk.  PHARYNGEAL: Pt exhibits moderate to severe pharyngeal dysphagia. Swallow reflex was noted to occur at the vallecular sinus across consistencies. Poor epiglottic inversion was seen, which resulted in post swallow residue in the vallecular sinus. This was then observed to spill over into the laryngeal vestibule after the swallow across consistencies. Wet voice quality was noted after a slight delay, with pt then producing a weak, inefficient throat clear. Volitional cough was delayed, likely after aspiration had occurred. Pt required cues to dry swallow to decrease post-swallow vallecular residue.  Pt risk of aspiration of a full meal's worth of puree and even thickened liquid is significantly high. She was unable to demonstrate volitional cough effective enough to clear penetrate. At this time, NPO status continues to be recommended, with consideration of short term nonoral feeding method. SLP will continue to follow for dysphagia therapy, PO trials, and to determine readiness for PO intake and/or repeat instrumental assessment. MD and RN notified of results and recommendations.    Swallow Evaluation Recommendations  SLP Diet Recommendations: NPO  Medication Administration: Via alternative  means   Oral Care Recommendations: Oral care QID   Other Recommendations: Have oral suction available   Paulett Kaufhold B. [name] Ore, [facility], [place] Speech Language Pathologist Office: [phone] Pager: [phone]  Shonna Chock 08/23/2020,2:18 PM

## 2020-08-24 DIAGNOSIS — J9600 Acute respiratory failure, unspecified whether with hypoxia or hypercapnia: Secondary | ICD-10-CM | POA: Diagnosis not present

## 2020-08-24 LAB — BASIC METABOLIC PANEL
Anion gap: 12 (ref 5–15)
BUN: 21 mg/dL — ABNORMAL HIGH (ref 6–20)
CO2: 22 mmol/L (ref 22–32)
Calcium: 8.6 mg/dL — ABNORMAL LOW (ref 8.9–10.3)
Chloride: 109 mmol/L (ref 98–111)
Creatinine, Ser: 1.31 mg/dL — ABNORMAL HIGH (ref 0.44–1.00)
GFR, Estimated: 48 mL/min — ABNORMAL LOW (ref >60)
Glucose, Bld: 228 mg/dL — ABNORMAL HIGH (ref 70–99)
Potassium: 4.5 mmol/L (ref 3.5–5.1)
Sodium: 143 mmol/L (ref 135–145)

## 2020-08-24 LAB — CBC
HCT: 36.5 % (ref 36.0–46.0)
Hemoglobin: 11.4 g/dL — ABNORMAL LOW (ref 12.0–15.0)
MCH: 29.1 pg (ref 26.0–34.0)
MCHC: 31.2 g/dL (ref 30.0–36.0)
MCV: 93.1 fL (ref 80.0–100.0)
Platelets: 239 10*3/uL (ref 150–400)
RBC: 3.92 MIL/uL (ref 3.87–5.11)
RDW: 14.3 % (ref 11.5–15.5)
WBC: 8.4 10*3/uL (ref 4.0–10.5)
nRBC: 0 % (ref 0.0–0.2)

## 2020-08-24 LAB — GLUCOSE, CAPILLARY
Glucose-Capillary: 199 mg/dL — ABNORMAL HIGH (ref 70–99)
Glucose-Capillary: 226 mg/dL — ABNORMAL HIGH (ref 70–99)
Glucose-Capillary: 200 mg/dL — ABNORMAL HIGH (ref 70–99)
Glucose-Capillary: 107 mg/dL — ABNORMAL HIGH (ref 70–99)
Glucose-Capillary: 203 mg/dL — ABNORMAL HIGH (ref 70–99)
Glucose-Capillary: 194 mg/dL — ABNORMAL HIGH (ref 70–99)

## 2020-08-24 MED ORDER — METOPROLOL TARTRATE 5 MG/5ML IV SOLN
2.5000 mg | Freq: Three times a day (TID) | INTRAVENOUS | Status: DC
  Administered 2020-08-24 (×2): 2.5 mg via INTRAVENOUS
  Filled 2020-08-24 (×2): qty 5

## 2020-08-24 MED ORDER — DEXTROSE-NACL 5-0.45 % IV SOLN: INTRAVENOUS | Status: DC

## 2020-08-24 MED ORDER — LORAZEPAM 2 MG/ML IJ SOLN
2.0000 mg | Freq: Once | INTRAMUSCULAR | Status: AC
  Administered 2020-08-24: 2 mg via INTRAVENOUS
  Filled 2020-08-24: qty 1

## 2020-08-24 MED ORDER — METOPROLOL TARTRATE 5 MG/5ML IV SOLN
5.0000 mg | Freq: Three times a day (TID) | INTRAVENOUS | Status: DC
  Administered 2020-08-24 – 2020-08-28 (×11): 5 mg via INTRAVENOUS
  Filled 2020-08-24 (×11): qty 5
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
## 2020-08-24 NOTE — Progress Notes (Signed)
OT  Note  Patient Details Name: [name] MRN: [id number] DOB: 1966/08/20   OT order received. Will perform OT eval next day  Kari Baars, [place] [phone] Office- [phone], Thereasa Parkin 08/24/2020, 2:38 PM
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
## 2020-08-24 NOTE — Progress Notes (Signed)
Patient agitated, constantly yelling out. Much different from previous shifts where patient was drowsy, sometimes difficult to arouse. Dr. [place] Crane notified of change in mentation and was asked whether or not we should reassess her swallow evaluation. Instructed to perform nurse-led bedside swallow evaluation. Bedside swallow evaluation performed. Patient passed study. Advised by Dr. [place] Crane to advance her to small sips. Will continue to monitor at this time.

## 2020-08-24 NOTE — Progress Notes (Signed)
PROGRESS NOTE  [name] AGT:[id number] DOB: [birth year] DOA: 08/13/2020 PCP: [name], MD  HPI/Recap of past 34 hours: 54 year old African-American female with past medical history significant for essential hypertension, chronic anxiety/depression, asthma, hyperlipidemia, type 2 diabetes, polyneuropathy, paroxysmal A. fib on Eliquis, chronic combined diastolic and systolic CHF, prior CVA, CKD stage III A, polysubstance abuse including cocaine who presented to [name] ED after she was found unresponsive by her boyfriend. She was last seen well earlier in the evening of 08/13/2020. EMS was called and found her with seizure activity that was intermittent. In the ED she was intubated to protect her airway. She was seen by teleneurology with recommendation to continue Keppra 1 g twice daily and to obtain routine EEG as well of MRI brain with and without contrast. CT head without contrast showed no acute findings. PCCM was called and accepted the patient to their service.  No beds at [facility].  She was subsequently transferred to [facility] long for further management.   UDS + cocaine 08/13/20.  Extubated on 08/20/20.  NG tube placed due to concern for dysphagia.  Patient became very agitated on 08/22/20 removed her NG tube.  She was placed on Precedex briefly due to severe agitation.  Precedex is currently off.  She failed her swallow evaluation by speech therapist on 08/23/20.  MBS completed on 08/23/2020, high risk for aspiration, recommendation made for n.p.o. from speech therapist.  Attempted Panda tube placement at bedside but unsuccessful, patient declined 3rd Trial.  08/24/20: Somnolent this morning, on IV Keppra.  Vital signs are stable on the monitor in the room.  Assessment/Plan: Active Problems:   Acute respiratory failure requiring reintubation (HCC)   Lactic acid acidosis   AKI (acute kidney injury) ([place])   Atrial fibrillation with rapid ventricular response (HCC)    Pressure injury of skin   Respiratory failure ([place])   Grand mal seizure (HCC)   Fever  Improving toxic metabolic encephalopathy likely multifactorial secondary to seizure, hypoglycemia, acute right basal ganglia stroke, cocaine Required Precedex on 08/22/2020, currently off Continue Keppra IV fluids since n.p.o. Reorient as needed Delirium precautions in place.  Severe dysphagia Failed bedside swallow evaluation by speech therapy MBS completed on 08/23/2020, recommendation for n.p.o. due to high risk for aspiration She failed her swallow evaluation by speech therapist on 08/23/20.  MBS completed on 08/23/2020, high risk for aspiration, recommendation made for n.p.o. from speech therapist.  Attempted Panda tube placement at bedside but unsuccessful, patient declined 3rd Trial. Continue aspiration precautions.  Resolved hypoglycemia, currently on D5W Previously received D10, which was switched to D5W due to hyperglycemia. Continue to hold off insulin for now  New onset seizure in the setting of acute right basal ganglia stroke Seen by neurology Continue IV Keppra Continue seizure precautions  Acute right basal ganglia stroke PT OT recommending SNF Speech therapy following Continue fall and aspiration precautions  Paroxysmal A. fib On Eliquis prior to admission, n.p.o. Currently on full dose subcu Lovenox twice daily due to n.p.o.  Essential hypertension BP is not at goal Continue IV Lopressor scheduled, clonidine patch and as needed IV antihypertensives with parameters. Continue to monitor vital signs  Type 2 diabetes, currently hypoglycemic requiring IV dextrose infusion  Hemoglobin A1c 13.8 on 08/14/2020. Hold off insulin for now  Polysubstance abuse UDS positive for cocaine 08/13/2020 Tobacco use disorder Polysubstance abuse cessation counseling when more alert  Physical debility PT OT recommended SNF Fall precautions TOC consulted to assist with SNF  placement.  Code Status: Full code  Family Communication: Updated her brother via phone on 08/24/2020.  Disposition Plan: SNF when medically stable   Consultants:  Neurology  PCCM  Procedures: Intubation Extubation MBS  Antimicrobials:  None  DVT prophylaxis:  Full dose Lovenox subcu twice daily.  Status is: Inpatient    Dispo:  Patient From: Home  Planned Disposition:SNF.              Expected discharge date: 08/27/20  Medically stable for discharge: No, ongoing management of new onset seizure and severe dysphagia, currently n.p.o with no established means of nutrition.         Objective: Vitals:   08/24/20 1100 08/24/20 1200 08/24/20 1211 08/24/20 1300  BP: (!) 172/101  (!) 184/117 (!) 163/117  Pulse:    87  Resp: 17  (!) 21 (!) 25  Temp:  98.9 F (37.2 C)    TempSrc:  Oral    SpO2:    100%  Weight:      Height:        Intake/Output Summary (Last 24 hours) at 08/24/2020 1332 Last data filed at 08/24/2020 1200 Gross per 24 hour  Intake 2000.31 ml  Output 1200 ml  Net 800.31 ml   Filed Weights   08/22/20 0500 08/23/20 0426 08/24/20 0425  Weight: 100.8 kg 101 kg 97 kg    Exam:  . General: 54 y.o. year-old female well-developed well-nourished somnolent in no acute distress. . Cardiovascular: Regular rate and rhythm no rubs or gallops. Marland Kitchen Respiratory: Clear to auscultation no wheezing noted.  Poor inspiratory effort.   . Abdomen: Soft nontender normal bowel sounds present. . Musculoskeletal: No lower extremity edema bilaterally. Marland Kitchen Psychiatry: Unable to assess mood due to somnolence.   Data Reviewed: CBC: Recent Labs  Lab 08/20/20 0304 08/21/20 0234 08/22/20 0226 08/23/20 0947 08/24/20 0240  WBC 9.8 12.6* 9.6 8.1 8.4  HGB 13.0 12.8 12.6 12.5 11.4*  HCT 42.0 41.8 41.2 40.9 36.5  MCV 95.5 95.9 96.3 96.2 93.1  PLT 148* 181 247 246 709   Basic Metabolic Panel: Recent Labs  Lab 08/17/20 1656 08/19/20 0301 08/20/20 0304  08/21/20 0228 08/23/20 0947 08/24/20 0240  NA  --  144 144 144 143 143  K  --  3.1* 4.2 4.6 4.1 4.5  CL  --  114* 113* 114* 110 109  CO2  --  20* 20* 21* 22 22  GLUCOSE  --  303* 263* 83 146* 228*  BUN  --  22* 26* 29* 24* 21*  CREATININE  --  1.61* 1.70* 1.52* 1.33* 1.31*  CALCIUM  --  8.4* 8.6* 8.7* 8.6* 8.6*  MG 1.9  --   --  1.9  --   --   PHOS 2.4*  --   --   --   --   --    GFR: Estimated Creatinine Clearance: 51.1 mL/min (A) (by C-G formula based on SCr of 1.31 mg/dL (H)). Liver Function Tests: Recent Labs  Lab 08/20/20 0304  AST 8*  ALT 7  ALKPHOS 56  BILITOT 0.5  PROT 5.2*  ALBUMIN 1.8*   No results for input(s): LIPASE, AMYLASE in the last 168 hours. No results for input(s): AMMONIA in the last 168 hours. Coagulation Profile: No results for input(s): INR, PROTIME in the last 168 hours. Cardiac Enzymes: No results for input(s): CKTOTAL, CKMB, CKMBINDEX, TROPONINI in the last 168 hours. BNP (last 3 results) No results for input(s): PROBNP in the last 8760 hours. HbA1C: No  results for input(s): HGBA1C in the last 72 hours. CBG: Recent Labs  Lab 08/23/20 1951 08/24/20 0116 08/24/20 0427 08/24/20 0749 08/24/20 1211  GLUCAP 188* 199* 226* 200* 107*   Lipid Profile: Recent Labs    08/23/20 0248  CHOL 240*  HDL 34*  LDLCALC 180*  TRIG 131  CHOLHDL 7.1   Thyroid Function Tests: No results for input(s): TSH, T4TOTAL, FREET4, T3FREE, THYROIDAB in the last 72 hours. Anemia Panel: No results for input(s): VITAMINB12, FOLATE, FERRITIN, TIBC, IRON, RETICCTPCT in the last 72 hours. Urine analysis:    Component Value Date/Time   COLORURINE STRAW (A) 08/13/2020 2027   APPEARANCEUR CLEAR 08/13/2020 2027   LABSPEC 1.015 08/13/2020 2027   PHURINE 7.0 08/13/2020 2027   GLUCOSEU >=500 (A) 08/13/2020 2027   HGBUR SMALL (A) 08/13/2020 2027   BILIRUBINUR NEGATIVE 08/13/2020 2027   KETONESUR NEGATIVE 08/13/2020 2027   PROTEINUR >300 (A) 08/13/2020 2027    UROBILINOGEN 0.2 09/11/2014 1319   NITRITE NEGATIVE 08/13/2020 2027   LEUKOCYTESUR NEGATIVE 08/13/2020 2027   Sepsis Labs: '@LABRCNTIP' (procalcitonin:4,lacticidven:4)  ) Recent Results (from the past 240 hour(s))  MRSA PCR Screening     Status: None   Collection Time: 08/14/20  6:54 PM   Specimen: Nasal Mucosa; Nasopharyngeal  Result Value Ref Range Status   MRSA by PCR NEGATIVE NEGATIVE Final    Comment:        The GeneXpert MRSA Assay (FDA approved for NASAL specimens only), is one component of a comprehensive MRSA colonization surveillance program. It is not intended to diagnose MRSA infection nor to guide or monitor treatment for MRSA infections. Performed at [facility], [place] [address], [place], [place] [address]   Anaerobic culture     Status: None   Collection Time: 08/15/20  5:30 PM   Specimen: PATH Cytology CSF; Cerebrospinal Fluid  Result Value Ref Range Status   Specimen Description   Final    CSF Performed at [place] [address] [place], [place] [address]    Special Requests   Final    NONE Performed at [facility], [place] [address] [place], [place] [address]    Gram Stain   Final    WBC PRESENT,BOTH PMN AND MONONUCLEAR RED BLOOD CELLS PRESENT NO ORGANISMS SEEN CYTOSPIN SMEAR    Culture   Final    NO ANAEROBES ISOLATED Performed at [facility], [address], [place], [place] [address]    Report Status 08/20/2020 FINAL  Final  CSF culture     Status: None   Collection Time: 08/15/20  5:30 PM   Specimen: PATH Cytology CSF; Cerebrospinal Fluid  Result Value Ref Range Status   Specimen Description   Final    CSF Performed at [place] [address] [place], [place] [address]    Special Requests   Final    NONE Performed at [facility], [place] [address] [place], [place] [address]    Gram Stain   Final    NO ORGANISMS SEEN Gram  Stain Report Called to,Read Back By and Verified With: [facility] RN AT [place] ON 08/15/20 BY S.VANHOORNE Performed at [facility], [place] [address], [place], [place] [address]    Culture   Final    NO GROWTH 3 DAYS Performed at [facility], [place] [address] [place], [place] [address]    Report Status 08/19/2020 FINAL  Final  Culture, fungus without smear  Status: None (Preliminary result)   Collection Time: 08/15/20  5:30 PM   Specimen: PATH Cytology CSF; Cerebrospinal Fluid  Result Value Ref Range Status   Specimen Description   Final    CSF Performed at [place] [address] [place], [place] [address]    Special Requests   Final    NONE Performed at [facility], [place] [address] [place], [place] [address]    Culture   Final    NO FUNGUS ISOLATED AFTER 9 DAYS Performed at [facility], [place] [address], [place],  [address]    Report Status PENDING  Incomplete      Studies: DG Swallowing Func-Speech Pathology  Result Date: 08/23/2020 Objective Swallowing Evaluation: Type of Study: MBS-Modified Barium Swallow Study  Patient Details Name: CHEREESE CILENTO MRN: [id number] Date of Birth: [birth year] Today's Date: 08/23/2020 Time: SLP Start Time (ACUTE ONLY): 1315 -SLP Stop Time (ACUTE ONLY): 1345 SLP Time Calculation (min) (ACUTE ONLY): 30 min Past Medical History: Past Medical History: Diagnosis Date . Alcohol use  . Ankle fracture, lateral malleolus, closed 12/30/2011 . Anxiety  . Breast mass, left 12/15/2011 . Chronic anemia  . Chronic combined systolic and diastolic CHF (congestive heart failure) ([place])  . CKD (chronic kidney disease), stage III ([place])   pt denies . Cocaine abuse ([place])  . COPD (chronic obstructive pulmonary disease) ([place])  . Diabetes mellitus, type 2 ([place])  . Diabetic Charcot foot ([place]) 12/30/2011 . Essential hypertension  . History of GI bleed 12/05/2009  Qualifier: Diagnosis of  By: Zeb Comfort   . Hyperlipidemia  . Hypertensive cardiomyopathy ([place]) 11/08/2012  a. 05/2016: echo showing EF of 20-25% with normal cors by cath in 07/2016. . Morbid obesity ([place]) 01/31/2008  Qualifier: Diagnosis of  By: Lenn Cal   . Noncompliance with medication regimen  . Obesity  . Panic attacks  . PAT (paroxysmal atrial tachycardia) ([place])   a. started on amiodarone 07/2017 for this. . Sleep apnea   does not use cpap . Stroke ([place])   06/24/17 . Tobacco abuse  . Urinary incontinence  Past Surgical History: Past Surgical History: Procedure Laterality Date . BREAST BIOPSY   . CARDIAC CATHETERIZATION N/A 07/28/2016  Procedure: Left Heart Cath and Coronary Angiography;  Surgeon: Jettie Booze, MD;  Location: [facility];  Service: Cardiovascular;  Laterality: N/A; . COLONOSCOPY N/A 05/10/2019  Procedure: COLONOSCOPY;  Surgeon: Danie Binder, MD;  [facility];  Service: Endoscopy;  Laterality: N/A;  Phenergan 12.5 mg IV in pre-op . DILATION AND CURETTAGE OF UTERUS   . I & D EXTREMITY Bilateral 09/22/2017  Procedure: BILATERAL DEBRIDEMENT LEG/FOOT ULCERS, APPLY VERAFLO WOUND VAC;  Surgeon: [place] Minion, MD;  Location: [facility];  Service: Orthopedics;  Laterality: Bilateral; . I & D EXTREMITY Right 10/11/2018  Procedure: IRRIGATION AND DEBRIDEMENT RIGHT HAND;  Surgeon: [name], MD;  Location: [place] [facility];  Service: Orthopedics;  Laterality: Right; . I & D EXTREMITY Right 10/13/2018  Procedure: REPEAT IRRIGATION AND DEBRIDEMENT RIGHT HAND;  Surgeon: [name], MD;  Location: [place];  Service: Orthopedics;  Laterality: Right; . I & D EXTREMITY Right 11/22/2018  Procedure: IRRIGATION AND DEBRIDEMENT AND PINNING RIGHT HAND;  Surgeon: [name], MD;  Location: [facility];  Service: Orthopedics;  Laterality: Right; . IR RADIOLOGIST EVAL & MGMT  07/05/2018 . POLYPECTOMY  05/10/2019  Procedure: POLYPECTOMY;  Surgeon: Danie Binder, MD;  [facility];  Service: Endoscopy;; .  SKIN SPLIT GRAFT  Bilateral 09/28/2017  Procedure: BILATERAL SPLIT THICKNESS SKIN GRAFT LEGS/FEET AND APPLY VAC;  Surgeon: Newt Minion, MD;  Location: [place] [facility];  Service: Orthopedics;  Laterality: Bilateral; . SKIN SPLIT GRAFT Right 11/22/2018  Procedure: SKIN GRAFT SPLIT THICKNESS;  Surgeon: [name], MD;  Location: [place] [facility];  Service: Orthopedics;  Laterality: Right; HPI: 54yo female admitted 08/14/20 with seizures, found unresponsive. PMH: alcohol/cocaine use, anxiety, anemia, CHF, CKD, COPD, DM, essential HTN, GIB, HLD, cardiomyopathy, obesity, PAT (paroxysmal atrial tachycardia), OSA, CVA (2018).  Intubated 10/19-26/21. MRI = subtle acute/early subacute infarct Right basal ganglia. Numerous bilateral prior basal ganglia and thalamic microhemorrhages with additional larger area of prior hemorrhage. Small remote bilateral cerebellar lacunar infarcts.  Subjective: Pt seen in radiology for MBS Assessment / Plan / Recommendation CHL IP CLINICAL IMPRESSIONS 08/23/2020 Clinical Impression Pt seen for MBS to objectively assess swallow function and safety, and to identify least restrictive diet. Pt was presented with trials of nectar thick liquid, honey thick liquid, and puree. ORAL: Pt presents with functional oral swallow, for the consistencies tested. No anterior leakage or oral residue on any texture given. Solid textures were not assessed at this time, given high aspiration risk. PHARYNGEAL: Pt exhibits moderate to severe pharyngeal dysphagia. Swallow reflex was noted to occur at the vallecular sinus across consistencies. Poor epiglottic inversion was seen, which resulted in post swallow residue in the vallecular sinus. This was then observed to spill over into the laryngeal vestibule after the swallow. Wet voice quality was noted after a slight delay, with pt then producing a weak, inefficient throat clear. Volitional cough was delayed, likely after aspiration had occurred. Pt required cues to dry swallow to decrease post-swallow  vallecular residue. Pt risk of aspiration of a full meal's worth of puree and even thickened liquid is significantly high. She was unable to demonstrate volitional cough effective enough to clear penetrate. At this time, NPO status continues to be recommended, with consideration of short term nonoral feeding method. SLP will continue to follow for dysphagia therapy, PO trials, and to determine readiness for PO intake and/or repeat instrumental assessment. MD and RN notified of results and recommendations.  SLP Visit Diagnosis Dysphagia, pharyngeal phase (R13.13)     Impact on safety and function Severe aspiration risk;Risk for inadequate nutrition/hydration   CHL IP TREATMENT RECOMMENDATION 08/23/2020 Treatment Recommendations Therapy as outlined in treatment plan below   Prognosis 08/23/2020 Prognosis for Safe Diet Advancement Fair Barriers to Reach Goals Severity of deficits   CHL IP DIET RECOMMENDATION 08/23/2020 SLP Diet Recommendations NPO   Medication Administration Via alternative means   CHL IP OTHER RECOMMENDATIONS 08/23/2020   Oral Care Recommendations Oral care QID Other Recommendations Have oral suction available   CHL IP FOLLOW UP RECOMMENDATIONS 08/23/2020 Follow up Recommendations 24 hour supervision/assistance   CHL IP FREQUENCY AND DURATION 08/23/2020 Speech Therapy Frequency (ACUTE ONLY) min 2x/week Treatment Duration 1 week;2 weeks      CHL IP ORAL PHASE 08/23/2020 Oral Phase WFL for thickened liquids and puree textures    CHL IP PHARYNGEAL PHASE 08/23/2020 Pharyngeal Phase Impaired  Swallow reflex trigger at vallecular sinus Decreased epiglottic inversion Vallecular residue Penetration after the swallow from vallecular residue Reflexive cough delayed - suspect after aspiration had occurred.   CHL IP CERVICAL ESOPHAGEAL PHASE 08/23/2020 Cervical Esophageal Phase [facility]   Celia B. [name] Ore, [facility], [place] Speech Language Pathologist Office: [phone] Pager: [phone] Shonna Chock 08/23/2020,  2:06 PM  Scheduled Meds: . amLODipine  10 mg Per Tube Daily  . chlorhexidine gluconate (MEDLINE KIT)  15 mL Mouth Rinse BID  . Chlorhexidine Gluconate Cloth  6 each Topical Daily  . cloNIDine  0.2 mg Transdermal Weekly  . enoxaparin (LOVENOX) injection  1 mg/kg Subcutaneous Q12H  . ezetimibe  10 mg Oral Daily  . feeding supplement (PROSource TF)  45 mL Per Tube BID  . free water  250 mL Per Tube Q6H  . gabapentin  300 mg Per Tube Q8H  . hydrOXYzine  25 mg Oral QHS  . linagliptin  5 mg Per Tube Daily  . metoprolol tartrate  2.5 mg Intravenous Q8H  . montelukast  10 mg Oral QHS  . nicotine  21 mg Transdermal Daily  . pantoprazole (PROTONIX) IV  40 mg Intravenous Q24H  . pneumococcal 23 valent vaccine  0.5 mL Intramuscular Tomorrow-1000  . rosuvastatin  10 mg Oral Daily  . traZODone  50 mg Per Tube QHS    Continuous Infusions: . dexmedetomidine (PRECEDEX) IV infusion Stopped (08/22/20 1011)  . dextrose 5 % and 0.45% NaCl 30 mL/hr at 08/24/20 1200  . feeding supplement (OSMOLITE 1.2 CAL) Stopped (08/22/20 0300)  . lactated ringers Stopped (08/20/20 2303)  . levETIRAcetam Stopped (08/24/20 1041)     LOS: 11 days     Kayleen Memos, MD [facility] Pager [phone]  If 7PM-7AM, please contact night-coverage [email or web address] Password [email or web address] 08/24/2020, 1:32 PM

## 2020-08-25 DIAGNOSIS — J9600 Acute respiratory failure, unspecified whether with hypoxia or hypercapnia: Secondary | ICD-10-CM | POA: Diagnosis not present

## 2020-08-25 LAB — GLUCOSE, CAPILLARY
Glucose-Capillary: 174 mg/dL — ABNORMAL HIGH (ref 70–99)
Glucose-Capillary: 165 mg/dL — ABNORMAL HIGH (ref 70–99)
Glucose-Capillary: 190 mg/dL — ABNORMAL HIGH (ref 70–99)

## 2020-08-25 LAB — BASIC METABOLIC PANEL
Anion gap: 10 (ref 5–15)
BUN: 16 mg/dL (ref 6–20)
CO2: 22 mmol/L (ref 22–32)
Calcium: 8.3 mg/dL — ABNORMAL LOW (ref 8.9–10.3)
Chloride: 111 mmol/L (ref 98–111)
Creatinine, Ser: 1.32 mg/dL — ABNORMAL HIGH (ref 0.44–1.00)
GFR, Estimated: 47 mL/min — ABNORMAL LOW (ref >60)
Glucose, Bld: 203 mg/dL — ABNORMAL HIGH (ref 70–99)
Potassium: 4.8 mmol/L (ref 3.5–5.1)
Sodium: 143 mmol/L (ref 135–145)

## 2020-08-25 LAB — CBC
HCT: 33.8 % — ABNORMAL LOW (ref 36.0–46.0)
Hemoglobin: 10.5 g/dL — ABNORMAL LOW (ref 12.0–15.0)
MCH: 30.3 pg (ref 26.0–34.0)
MCHC: 31.1 g/dL (ref 30.0–36.0)
MCV: 97.4 fL (ref 80.0–100.0)
Platelets: 236 10*3/uL (ref 150–400)
RBC: 3.47 MIL/uL — ABNORMAL LOW (ref 3.87–5.11)
RDW: 14.1 % (ref 11.5–15.5)
WBC: 7.8 10*3/uL (ref 4.0–10.5)
nRBC: 0 % (ref 0.0–0.2)

## 2020-08-25 MED ORDER — CLOTRIMAZOLE 1 % VA CREA
1.0000 | TOPICAL_CREAM | Freq: Every day | VAGINAL | Status: DC
  Administered 2020-08-26 – 2020-08-27 (×2): 1 via VAGINAL
  Filled 2020-08-25 (×2): qty 45

## 2020-08-25 MED ORDER — LORAZEPAM 2 MG/ML IJ SOLN
2.0000 mg | Freq: Once | INTRAMUSCULAR | Status: DC
  Filled 2020-08-25: qty 1

## 2020-08-25 NOTE — Progress Notes (Signed)
Spoke with Pt's son multiple times and updated.

## 2020-08-25 NOTE — Progress Notes (Signed)
PROGRESS NOTE  [name] EXB:[id number] DOB: Mar 06, 1966 DOA: 08/13/2020 PCP: [name], MD  HPI/Recap of past 64 hours: 54 year old African-American female with past medical history significant for essential hypertension, chronic anxiety/depression, asthma, hyperlipidemia, type 2 diabetes, polyneuropathy, paroxysmal A. fib on Eliquis, chronic combined diastolic and systolic CHF, prior CVA, CKD stage III A, polysubstance abuse including cocaine who presented to [name] ED after she was found unresponsive by her boyfriend. She was last seen well earlier in the evening of 08/13/2020. EMS was called and found her with seizure activity that was intermittent. In the ED she was intubated to protect her airway. She was seen by teleneurology with recommendation to continue Keppra 1 g twice daily and to obtain routine EEG as well of MRI brain with and without contrast. CT head without contrast showed no acute findings. PCCM was called and accepted the patient to their service.  No beds at [facility].  She was subsequently transferred to [facility] long for further management.   UDS + cocaine 08/13/20.  Extubated on 08/20/20.  NG tube placed due to concern for dysphagia.  Patient became very agitated on 08/22/20 removed her NG tube.  She was placed on Precedex briefly due to severe agitation.  Precedex is currently off.  She failed her swallow evaluation by speech therapist on 08/23/20.  MBS completed on 08/23/2020, high risk for aspiration, recommendation made for n.p.o. from speech therapist.  Attempted Panda tube placement at bedside but unsuccessful, patient declined 3rd Trial.  08/25/20: No new complaints. Mainly wants to eat.  Speech assessment today.  Updated daughter via phone.  Assessment/Plan: Active Problems:   Acute respiratory failure requiring reintubation (HCC)   Lactic acid acidosis   AKI (acute kidney injury) ([place])   Atrial fibrillation with rapid ventricular response  (HCC)   Pressure injury of skin   Respiratory failure ([place])   Grand mal seizure (HCC)   Fever  Improving toxic metabolic encephalopathy likely multifactorial secondary to seizure, hypoglycemia, acute right basal ganglia stroke, cocaine Required Precedex on 08/22/2020, currently off Continue Keppra IV fluids since n.p.o. Reorient as needed Delirium precautions in place.  Severe dysphagia Failed bedside swallow evaluation by speech therapy MBS completed on 08/23/2020, recommendation for n.p.o. due to high risk for aspiration She failed her swallow evaluation by speech therapist on 08/23/20.  MBS completed on 08/23/2020, high risk for aspiration, recommendation made for n.p.o. from speech therapist.  Attempted Panda tube placement at bedside but unsuccessful, patient declined 3rd Trial. Continue aspiration precautions.  Resolved hypoglycemia, currently on D5W Previously received D10, which was switched to D5W due to hyperglycemia. Continue to hold off insulin for now  New onset seizure in the setting of acute right basal ganglia stroke Seen by neurology Continue IV Keppra Continue seizure precautions  Acute right basal ganglia stroke PT OT recommending SNF Speech therapy following Continue fall and aspiration precautions  Paroxysmal A. fib On Eliquis prior to admission, n.p.o. Currently on full dose subcu Lovenox twice daily due to n.p.o.  Essential hypertension BP is not at goal Continue IV Lopressor scheduled, clonidine patch and as needed IV antihypertensives with parameters. Continue to monitor vital signs  Type 2 diabetes, currently hypoglycemic requiring IV dextrose infusion  Hemoglobin A1c 13.8 on 08/14/2020. Hold off insulin for now  Polysubstance abuse UDS positive for cocaine 08/13/2020 Tobacco use disorder Polysubstance abuse cessation counseling when more alert  Physical debility PT OT recommended SNF Fall precautions TOC consulted to assist with SNF  placement.  Code Status: Full code  Family Communication: Updated her brother via phone on 08/24/2020.  Disposition Plan: SNF when medically stable   Consultants:  Neurology  PCCM  Procedures: Intubation Extubation MBS  Antimicrobials:  None  DVT prophylaxis:  Full dose Lovenox subcu twice daily.  Status is: Inpatient    Dispo:  Patient From: Home  Planned Disposition:SNF.              Expected discharge date: 08/27/20  Medically stable for discharge: No, ongoing management of new onset seizure and severe dysphagia, currently n.p.o with no established means of nutrition.         Objective: Vitals:   08/25/20 0800 08/25/20 1000 08/25/20 1200 08/25/20 1414  BP: (!) 166/88  (!) 167/97 (!) 165/108  Pulse: 74 75 81 85  Resp: '16 17 19 16  ' Temp: 98.3 F (36.8 C)  97.9 F (36.6 C)   TempSrc: Oral  Oral   SpO2: 99% 99% 94% 98%  Weight:      Height:        Intake/Output Summary (Last 24 hours) at 08/25/2020 1707 Last data filed at 08/25/2020 1400 Gross per 24 hour  Intake 259.18 ml  Output 600 ml  Net -340.82 ml   Filed Weights   08/22/20 0500 08/23/20 0426 08/24/20 0425  Weight: 100.8 kg 101 kg 97 kg    Exam: No significant changes from prior exam.  . General: 55 y.o. year-old female well-developed well-nourished somnolent in no acute distress. . Cardiovascular: Regular rate and rhythm no rubs or gallops. Marland Kitchen Respiratory: Clear to auscultation no wheezing noted.  Poor inspiratory effort.   . Abdomen: Soft nontender normal bowel sounds present. . Musculoskeletal: No lower extremity edema bilaterally. Marland Kitchen Psychiatry: Unable to assess mood due to somnolence.   Data Reviewed: CBC: Recent Labs  Lab 08/21/20 0234 08/22/20 0226 08/23/20 0947 08/24/20 0240 08/25/20 0312  WBC 12.6* 9.6 8.1 8.4 7.8  HGB 12.8 12.6 12.5 11.4* 10.5*  HCT 41.8 41.2 40.9 36.5 33.8*  MCV 95.9 96.3 96.2 93.1 97.4  PLT 181 247 246 239 102   Basic Metabolic  Panel: Recent Labs  Lab 08/20/20 0304 08/21/20 0228 08/23/20 0947 08/24/20 0240 08/25/20 0312  NA 144 144 143 143 143  K 4.2 4.6 4.1 4.5 4.8  CL 113* 114* 110 109 111  CO2 20* 21* '22 22 22  ' GLUCOSE 263* 83 146* 228* 203*  BUN 26* 29* 24* 21* 16  CREATININE 1.70* 1.52* 1.33* 1.31* 1.32*  CALCIUM 8.6* 8.7* 8.6* 8.6* 8.3*  MG  --  1.9  --   --   --    GFR: Estimated Creatinine Clearance: 50.7 mL/min (A) (by C-G formula based on SCr of 1.32 mg/dL (H)). Liver Function Tests: Recent Labs  Lab 08/20/20 0304  AST 8*  ALT 7  ALKPHOS 56  BILITOT 0.5  PROT 5.2*  ALBUMIN 1.8*   No results for input(s): LIPASE, AMYLASE in the last 168 hours. No results for input(s): AMMONIA in the last 168 hours. Coagulation Profile: No results for input(s): INR, PROTIME in the last 168 hours. Cardiac Enzymes: No results for input(s): CKTOTAL, CKMB, CKMBINDEX, TROPONINI in the last 168 hours. BNP (last 3 results) No results for input(s): PROBNP in the last 8760 hours. HbA1C: No results for input(s): HGBA1C in the last 72 hours. CBG: Recent Labs  Lab 08/24/20 1551 08/24/20 2111 08/25/20 0107 08/25/20 0332 08/25/20 0738  GLUCAP 203* 194* 174* 165* 190*   Lipid Profile: Recent Labs  08/23/20 0248  CHOL 240*  HDL 34*  LDLCALC 180*  TRIG 131  CHOLHDL 7.1   Thyroid Function Tests: No results for input(s): TSH, T4TOTAL, FREET4, T3FREE, THYROIDAB in the last 72 hours. Anemia Panel: No results for input(s): VITAMINB12, FOLATE, FERRITIN, TIBC, IRON, RETICCTPCT in the last 72 hours. Urine analysis:    Component Value Date/Time   COLORURINE STRAW (A) 08/13/2020 2027   APPEARANCEUR CLEAR 08/13/2020 2027   LABSPEC 1.015 08/13/2020 2027   PHURINE 7.0 08/13/2020 2027   GLUCOSEU >=500 (A) 08/13/2020 2027   HGBUR SMALL (A) 08/13/2020 2027   BILIRUBINUR NEGATIVE 08/13/2020 2027   KETONESUR NEGATIVE 08/13/2020 2027   PROTEINUR >300 (A) 08/13/2020 2027   UROBILINOGEN 0.2 09/11/2014 1319    NITRITE NEGATIVE 08/13/2020 2027   LEUKOCYTESUR NEGATIVE 08/13/2020 2027   Sepsis Labs: '@LABRCNTIP' (procalcitonin:4,lacticidven:4)  ) Recent Results (from the past 240 hour(s))  Anaerobic culture     Status: None   Collection Time: 08/15/20  5:30 PM   Specimen: PATH Cytology CSF; Cerebrospinal Fluid  Result Value Ref Range Status   Specimen Description   Final    CSF Performed at [facility], [place] [address], [place], [place] [address]    Special Requests   Final    NONE Performed at [facility], [place] [address], [place], [place] [address]    Gram Stain   Final    WBC PRESENT,BOTH PMN AND MONONUCLEAR RED BLOOD CELLS PRESENT NO ORGANISMS SEEN CYTOSPIN SMEAR    Culture   Final    NO ANAEROBES ISOLATED Performed at [facility], [address] [place], [place] [address]    Report Status 08/20/2020 FINAL  Final  CSF culture     Status: None   Collection Time: 08/15/20  5:30 PM   Specimen: PATH Cytology CSF; Cerebrospinal Fluid  Result Value Ref Range Status   Specimen Description   Final    CSF Performed at [place] [address], [place], [place] [address]    Special Requests   Final    NONE Performed at [facility], [place] [address], [place], [place] [address]    Gram Stain   Final    NO ORGANISMS SEEN Gram Stain Report Called to,Read Back By and Verified With: [facility] RN AT [place] ON 08/15/20 BY S.VANHOORNE Performed at [facility], [place] [address] [place], [place] [address]    Culture   Final    NO GROWTH 3 DAYS Performed at [facility], [place] [address], [place], [place] [address]    Report Status 08/19/2020 FINAL  Final  Culture, fungus without smear     Status: None (Preliminary result)   Collection Time: 08/15/20  5:30 PM   Specimen: PATH Cytology CSF; Cerebrospinal Fluid  Result Value Ref Range Status   Specimen Description   Final     CSF Performed at [place] [address] [place], [place] [address]    Special Requests   Final    NONE Performed at [facility], [place] [address], [place], [place] [address]    Culture   Final    NO FUNGUS ISOLATED AFTER 10 DAYS Performed at [facility], [place] [address], [place],  [address]    Report Status PENDING  Incomplete      Studies: No results found.  Scheduled Meds: . amLODipine  10 mg Per Tube Daily  . chlorhexidine gluconate (MEDLINE  KIT)  15 mL Mouth Rinse BID  . Chlorhexidine Gluconate Cloth  6 each Topical Daily  . cloNIDine  0.2 mg Transdermal Weekly  . enoxaparin (LOVENOX) injection  1 mg/kg Subcutaneous Q12H  . ezetimibe  10 mg Oral Daily  . feeding supplement (PROSource TF)  45 mL Per Tube BID  . free water  250 mL Per Tube Q6H  . gabapentin  300 mg Per Tube Q8H  . hydrOXYzine  25 mg Oral QHS  . linagliptin  5 mg Per Tube Daily  . LORazepam  2 mg Intravenous Once  . metoprolol tartrate  5 mg Intravenous Q8H  . montelukast  10 mg Oral QHS  . nicotine  21 mg Transdermal Daily  . pantoprazole (PROTONIX) IV  40 mg Intravenous Q24H  . pneumococcal 23 valent vaccine  0.5 mL Intramuscular Tomorrow-1000  . rosuvastatin  10 mg Oral Daily  . traZODone  50 mg Per Tube QHS    Continuous Infusions: . dextrose 5 % and 0.45% NaCl 30 mL/hr at 08/24/20 1800  . lactated ringers Stopped (08/20/20 2303)  . levETIRAcetam Stopped (08/25/20 1125)     LOS: 12 days     Kayleen Memos, MD [facility] Pager [phone]  If 7PM-7AM, please contact night-coverage [email or web address] Password [email or web address] 08/25/2020, 5:07 PM

## 2020-08-25 NOTE — Progress Notes (Signed)
  Speech Language Pathology Treatment: Dysphagia  Patient Details Name: [name] MRN: [id number] DOB: [birth year] Today's Date: 08/25/2020 Time: 2355-7322 SLP Time Calculation (min) (ACUTE ONLY): 35 min  Assessment / Plan / Recommendation Clinical Impression  Notified by ICU RN that NG tube was not in place and there was no method by which to deliver meds.  Pt pleasant upon my arrival, saying "I've been waiting for you" and expressing gratitude.  MBS from 10/29 reviewed - pt's clinical presentation is improved today.  Speech remains dysarthric s/p basal ganglia CVA, but voice is stronger and of better quality. Bedside trials of thin liquids did not lead to frequent throat clearing as noted during prior session.  She demonstrated adequate attention and no overt coughing concerning for abject aspiration.    Swallowing function has likely improved since Friday's MBS, however recommend repeating MBS next date to identify degree of pathophysiology.  In there meantime, after oral care and when sitting upright, allow ice chips, small sips of water.  Pt may have a container of applesauce tonight, fed slowly.  Will f/u next date for MBS. D/W Dr. [place] Crane and RN.  Pt left in bed listening to gospel music and resting quietly.     HPI HPI: 54yo female admitted 08/14/20 with seizures, found unresponsive. PMH: alcohol/cocaine use, anxiety, anemia, CHF, CKD, COPD, DM, essential HTN, GIB, HLD, cardiomyopathy, obesity, PAT (paroxysmal atrial tachycardia), OSA, CVA (2018).  Intubated 10/19-26/21. MRI = subtle acute/early subacute infarct Right basal ganglia. Numerous bilateral prior basal ganglia and thalamic microhemorrhages with additional larger area of prior hemorrhage. Small remote bilateral cerebellar lacunar infarcts.      SLP Plan  Continue with current plan of care       Recommendations  Diet recommendations: NPO (except sips of water, ice chips, applesauce fr floor stock) Liquids provided via:  Cup;Straw Medication Administration: Crushed with puree Supervision: Staff to assist with self feeding Postural Changes and/or Swallow Maneuvers: Seated upright 90 degrees                Oral Care Recommendations: Oral care QID;Oral care prior to ice chip/H20 Follow up Recommendations: 24 hour supervision/assistance Plan: Continue with current plan of care       GO                Juan Quam Laurice 08/25/2020, 5:44 PM  Aoki Wedemeyer L. Tivis Ringer, [place] Office number [phone] Pager [phone]

## 2020-08-25 NOTE — Progress Notes (Signed)
OT Cancellation Note  Patient Details Name: [name] MRN: [id number] DOB: [birth year]   Cancelled Treatment:    Reason Eval/Treat Not Completed: Other (comment)  OT checked on pt earlier in the day and pt busy with nursing. OT then checked on pt in afternoon and pt getting ready to be moved to another room on 4th floor. Will check on pt later this day or next day Kari Baars, [place] [phone] Office- [phone], Thereasa Parkin 08/25/2020, 3:25 PM

## 2020-08-25 NOTE — Progress Notes (Signed)
Numerous phone calls received from family members this morning with concerns for patient nutrition.  Discussed with family that patient is not safe to drink and eat at this time and that we must wait for clearance from our speech thearpist who has been notified and  and will come to beside to evaluate.  Patient yelling out that she is thirsty, attempted mouth care but patient only wants "something to drink".    Daughter Darnell Level wants to be her contact person her number is [phone].  She has medical training and does not feel like the others are understanding the updates they are receiving.

## 2020-08-26 ENCOUNTER — Inpatient Hospital Stay (HOSPITAL_COMMUNITY): Payer: Medicare Other

## 2020-08-26 DIAGNOSIS — J9600 Acute respiratory failure, unspecified whether with hypoxia or hypercapnia: Secondary | ICD-10-CM | POA: Diagnosis not present

## 2020-08-26 LAB — CBC
HCT: 34.9 % — ABNORMAL LOW (ref 36.0–46.0)
Hemoglobin: 10.9 g/dL — ABNORMAL LOW (ref 12.0–15.0)
MCH: 29.7 pg (ref 26.0–34.0)
MCHC: 31.2 g/dL (ref 30.0–36.0)
MCV: 95.1 fL (ref 80.0–100.0)
Platelets: 268 10*3/uL (ref 150–400)
RBC: 3.67 MIL/uL — ABNORMAL LOW (ref 3.87–5.11)
RDW: 13.3 % (ref 11.5–15.5)
WBC: 8.1 10*3/uL (ref 4.0–10.5)
nRBC: 0 % (ref 0.0–0.2)

## 2020-08-26 LAB — BASIC METABOLIC PANEL
Anion gap: 13 (ref 5–15)
BUN: 14 mg/dL (ref 6–20)
CO2: 19 mmol/L — ABNORMAL LOW (ref 22–32)
Calcium: 8.5 mg/dL — ABNORMAL LOW (ref 8.9–10.3)
Chloride: 111 mmol/L (ref 98–111)
Creatinine, Ser: 1.22 mg/dL — ABNORMAL HIGH (ref 0.44–1.00)
GFR, Estimated: 52 mL/min — ABNORMAL LOW (ref >60)
Glucose, Bld: 190 mg/dL — ABNORMAL HIGH (ref 70–99)
Potassium: 4.1 mmol/L (ref 3.5–5.1)
Sodium: 143 mmol/L (ref 135–145)

## 2020-08-26 MED ORDER — SODIUM CHLORIDE 0.9 % IV SOLN
INTRAVENOUS | Status: DC | PRN
  Administered 2020-08-26: 250 mL via INTRAVENOUS

## 2020-08-26 MED ORDER — COLLAGENASE 250 UNIT/GM EX OINT
TOPICAL_OINTMENT | Freq: Every day | CUTANEOUS | Status: DC
  Administered 2020-08-26: 1 via TOPICAL
  Filled 2020-08-26: qty 30

## 2020-08-26 NOTE — Progress Notes (Signed)
PROGRESS NOTE  [name] IBB:[id number] DOB: May 27, 1966 DOA: 08/13/2020 PCP: [name], MD  HPI/Recap of past 70 hours: 54 year old African-American female with past medical history significant for essential hypertension, chronic anxiety/depression, asthma, hyperlipidemia, type 2 diabetes, polyneuropathy, paroxysmal A. fib on Eliquis, chronic combined diastolic and systolic CHF, prior CVA, CKD stage III A, polysubstance abuse including cocaine who presented to [name], ED after she was found unresponsive by her boyfriend. She was last seen well earlier in the evening of 08/13/2020. EMS was called and found her with seizure activity that was intermittent.  In the ED she was intubated to protect her airway. She was seen by teleneurology with recommendation to continue Keppra 1 g twice daily and to obtain routine EEG as well of MRI brain with and without contrast. CT head without contrast showed no acute findings. PCCM was called and accepted the patient to their service.  Due to no beds availability at [facility], she was transferred to [facility] long for further management.   UDS + cocaine 08/13/20.  Extubated on 08/20/20.  NG tube placed due to concern for dysphagia.  Patient became very agitated on 08/22/20 removed her NG tube.  She was placed on Precedex briefly due to severe agitation then it was turned off in less than 24 hours.  She failed her swallow evaluation by speech therapist on 08/23/20.  MBS completed on 08/23/2020, high risk for aspiration with recommendation for n.p.o. from speech therapist.  Attempted Panda tube placement at bedside but unsuccessful, patient declined 3rd Trial.  Reassessed by speech therapist on 08/25/20 who noted improvement in her dysphagia.  On 08/26/20 seen by speech therapist who recommended dysphagia 1 diet.  08/26/20: Seen and examined at her bedside.  She has no new complaints.  No acute events overnight.  She passed a swallow evaluation today  with recommendation for dysphagia 1 diet.  Continue aspiration precautions.  Assessment/Plan: Active Problems:   Acute respiratory failure requiring reintubation (HCC)   Lactic acid acidosis   AKI (acute kidney injury) ([place])   Atrial fibrillation with rapid ventricular response (HCC)   Pressure injury of skin   Respiratory failure ([place])   Grand mal seizure (HCC)   Fever  Resolved toxic metabolic encephalopathy likely multifactorial secondary to seizure, hypoglycemia, acute right basal ganglia stroke, cocaine abuse Required Precedex on 08/22/2020, off Precedex within 24-hour. Switch IV Keppra to p.o. Keppra 750 mg twice daily once able to tolerate oral intake. Continue delirium precaution Continue to reorient as needed. Continue fall precautions  Improving severe dysphagia Reassessed by speech therapist on 08/25/20 who noted improvement in her dysphagia.  On 08/26/20 seen by speech therapist who recommended dysphagia 1 diet. Continue aspiration precautions.  Resolved hypoglycemia, D5W discontinued on 08/26/2020 Insulin on hold due to recent severe hypoglycemia.  New onset seizure in the setting of acute right basal ganglia stroke Seen by neurology Continue AEDs Continue seizure precautions  Acute right basal ganglia stroke PT OT recommending SNF Speech therapy following Continue fall and aspiration precautions  Paroxysmal A. fib On Eliquis prior to admission, resume Eliquis once able to tolerate oral intake. Currently on full dose subcu Lovenox twice daily Continue to monitor on telemetry  Essential hypertension BP is not at goal Resume home oral antihypertensives IV antihypertensives as needed with parameters.  Type 2 diabetes, with recent severe hypoglycemia requiring D10 and D5W, hypoglycemia has resolved. Hemoglobin A1c 13.8 on 08/14/2020. Insulin currently on hold, will resume when can tolerate oral intake.  Polysubstance  abuse UDS positive for cocaine  08/13/2020 Tobacco use disorder Polysubstance abuse cessation counseling   Physical debility PT OT recommended SNF Fall precautions TOC consulted to assist with SNF placement.  Code Status: Full code  Family Communication: Updated her daughter via phone on 08/25/2020.  Disposition Plan: SNF when medically stable   Consultants:  Neurology  PCCM  Procedures: Intubation Extubation MBS  Antimicrobials:  None  DVT prophylaxis:  Full dose Lovenox subcu twice daily.  Status is: Inpatient    Dispo:  Patient From: Home  Planned Disposition:SNF.              Expected discharge date: 08/27/20  Medically stable for discharge: No, ongoing management of new onset seizure and dysphagia, restarted on a diet on 08/26/2020 after passing her swallow evaluation.        Objective: Vitals:   08/25/20 1414 08/25/20 2030 08/26/20 0442 08/26/20 0948  BP: (!) 165/108  (!) 176/99   Pulse: 85 84 81   Resp: 16 20 (!) 23   Temp: 98.2 F (36.8 C) 98.5 F (36.9 C) 99.7 F (37.6 C)   TempSrc: Oral Oral Oral   SpO2: 98% 94% 95%   Weight:    94.8 kg  Height:        Intake/Output Summary (Last 24 hours) at 08/26/2020 1002 Last data filed at 08/26/2020 0900 Gross per 24 hour  Intake 944.1 ml  Output 1900 ml  Net -955.9 ml   Filed Weights   08/23/20 0426 08/24/20 0425 08/26/20 0948  Weight: 101 kg 97 kg 94.8 kg    Exam:  . General: 54 y.o. year-old female pleasant well-developed well-nourished no acute distress.  Alert and interactive.  . Cardiovascular: Regular rate and rhythm no rubs or gallops. Marland Kitchen Respiratory: Clear to auscultation no wheezes no rales.  . Abdomen: Soft nontender normal bowel sounds present. . Musculoskeletal: No lower extremity edema bilaterally.  Marland Kitchen Psychiatry: Mood is appropriate for condition and setting.     Data Reviewed: CBC: Recent Labs  Lab 08/22/20 0226 08/23/20 0947 08/24/20 0240 08/25/20 0312 08/26/20 0610  WBC 9.6 8.1 8.4 7.8  8.1  HGB 12.6 12.5 11.4* 10.5* 10.9*  HCT 41.2 40.9 36.5 33.8* 34.9*  MCV 96.3 96.2 93.1 97.4 95.1  PLT 247 246 239 236 633   Basic Metabolic Panel: Recent Labs  Lab 08/21/20 0228 08/23/20 0947 08/24/20 0240 08/25/20 0312 08/26/20 0455  NA 144 143 143 143 143  K 4.6 4.1 4.5 4.8 4.1  CL 114* 110 109 111 111  CO2 21* '22 22 22 ' 19*  GLUCOSE 83 146* 228* 203* 190*  BUN 29* 24* 21* 16 14  CREATININE 1.52* 1.33* 1.31* 1.32* 1.22*  CALCIUM 8.7* 8.6* 8.6* 8.3* 8.5*  MG 1.9  --   --   --   --    GFR: Estimated Creatinine Clearance: 54.1 mL/min (A) (by C-G formula based on SCr of 1.22 mg/dL (H)). Liver Function Tests: Recent Labs  Lab 08/20/20 0304  AST 8*  ALT 7  ALKPHOS 56  BILITOT 0.5  PROT 5.2*  ALBUMIN 1.8*   No results for input(s): LIPASE, AMYLASE in the last 168 hours. No results for input(s): AMMONIA in the last 168 hours. Coagulation Profile: No results for input(s): INR, PROTIME in the last 168 hours. Cardiac Enzymes: No results for input(s): CKTOTAL, CKMB, CKMBINDEX, TROPONINI in the last 168 hours. BNP (last 3 results) No results for input(s): PROBNP in the last 8760 hours. HbA1C: No results for input(s): HGBA1C  in the last 72 hours. CBG: Recent Labs  Lab 08/24/20 1551 08/24/20 2111 08/25/20 0107 08/25/20 0332 08/25/20 0738  GLUCAP 203* 194* 174* 165* 190*   Lipid Profile: No results for input(s): CHOL, HDL, LDLCALC, TRIG, CHOLHDL, LDLDIRECT in the last 72 hours. Thyroid Function Tests: No results for input(s): TSH, T4TOTAL, FREET4, T3FREE, THYROIDAB in the last 72 hours. Anemia Panel: No results for input(s): VITAMINB12, FOLATE, FERRITIN, TIBC, IRON, RETICCTPCT in the last 72 hours. Urine analysis:    Component Value Date/Time   COLORURINE STRAW (A) 08/13/2020 2027   APPEARANCEUR CLEAR 08/13/2020 2027   LABSPEC 1.015 08/13/2020 2027   PHURINE 7.0 08/13/2020 2027   GLUCOSEU >=500 (A) 08/13/2020 2027   HGBUR SMALL (A) 08/13/2020 2027    BILIRUBINUR NEGATIVE 08/13/2020 2027   KETONESUR NEGATIVE 08/13/2020 2027   PROTEINUR >300 (A) 08/13/2020 2027   UROBILINOGEN 0.2 09/11/2014 1319   NITRITE NEGATIVE 08/13/2020 2027   LEUKOCYTESUR NEGATIVE 08/13/2020 2027   Sepsis Labs: '@LABRCNTIP' (procalcitonin:4,lacticidven:4)  ) No results found for this or any previous visit (from the past 240 hour(s)).    Studies: No results found.  Scheduled Meds: . amLODipine  10 mg Per Tube Daily  . chlorhexidine gluconate (MEDLINE KIT)  15 mL Mouth Rinse BID  . cloNIDine  0.2 mg Transdermal Weekly  . clotrimazole  1 Applicatorful Vaginal QHS  . enoxaparin (LOVENOX) injection  1 mg/kg Subcutaneous Q12H  . ezetimibe  10 mg Oral Daily  . feeding supplement (PROSource TF)  45 mL Per Tube BID  . gabapentin  300 mg Per Tube Q8H  . hydrOXYzine  25 mg Oral QHS  . linagliptin  5 mg Per Tube Daily  . LORazepam  2 mg Intravenous Once  . metoprolol tartrate  5 mg Intravenous Q8H  . montelukast  10 mg Oral QHS  . nicotine  21 mg Transdermal Daily  . pantoprazole (PROTONIX) IV  40 mg Intravenous Q24H  . pneumococcal 23 valent vaccine  0.5 mL Intramuscular Tomorrow-1000  . rosuvastatin  10 mg Oral Daily  . traZODone  50 mg Per Tube QHS    Continuous Infusions: . sodium chloride 250 mL (08/26/20 0939)  . lactated ringers Stopped (08/20/20 2303)  . levETIRAcetam 750 mg (08/26/20 0944)     LOS: 13 days     Kayleen Memos, MD [facility] Pager [phone]  If 7PM-7AM, please contact night-coverage [email or web address] Password [email or web address] 08/26/2020, 10:02 AM

## 2020-08-26 NOTE — Care Management Important Message (Signed)
Important Message  Patient Details IM Letter given to the Patient Name: [name] MRN: [id number] Date of Birth: 08-14-1966   Medicare Important Message Given:  Yes     Kerin Salen 08/26/2020, 11:20 AM

## 2020-08-26 NOTE — Progress Notes (Signed)
Per report from Nurse [facility]-  Patient received room service tray. Positioned w/ HOB fully upright, tray in reach. Patient demonstrated w/ NT in room ability to pick up and drink from cup w/ straw and use spoon to feed herself. NT verbalized to patient she would return to patient's room soon to check on her and proceeded to another patient's room. Patient became agitated, yelling about being unable to feed herself and proceeded to push her tray of food onto the floor. Educated patient about use of call bell for assistance, inappropriate behaviors and new POC. Verbalized to patient and family, whom she had called after throwing tray, that during meals patient will be expected to perform as much as she's able but staff will remain present to assist as needed until meal is complete. Everyone involved verbalized understanding. New meal tray ordered and intake completed without any more behaviors or disturbances.

## 2020-08-26 NOTE — Progress Notes (Signed)
During shift, patient able to move and turn well in bed. Fully incontinent of bladder resulting in several episodes of dressing changes and bed pad changes. Purewick in place w/ sanitary pad and mesh panties. Dressing CDI at this time. Will cont to monitor.

## 2020-08-26 NOTE — Consult Note (Signed)
[facility] Nurse wound consult note Consultation was completed by review of records, images and assistance from the bedside nurse/clinical staff.  Reason for Consult: worsening pressure injury Wound type: Unstageable Pressure Injury Pressure Injury POA: No Measurement:6.8cm x 3.5cm x 0.2cm (per nursing flow sheets) Wound bed: 80% yellow fibrinous/20% purple ruddy and open Drainage (amount, consistency, odor) scant, no odor Periwound: intact; fissured area in the apex of the gluteal cleft from moisture; MASD Dressing procedure/placement/frequency: Add enzymatic debridement ointment daily to the sacral wound, top with saline moist gauze dressing, ABD pads and secure with tape.  Low air loss mattress requested for moisture management and pressure redistribution. Unable to swallow at current, she is NPO would need to maximize nutrition for wound healing at some point.  [place] Nurse team will follow with you and see patient within 10 days for wound assessments.  Please notify [facility] nurses of any acute changes in the wounds or any new areas of concern [place] MSN, [place], [place], [place]

## 2020-08-26 NOTE — Progress Notes (Signed)
Physical Therapy Treatment Patient Details Name: [name] MRN: [id number] DOB: 12/13/1965 Today's Date: 08/26/2020    History of Present Illness HPI: 54yo female admitted 08/14/20 with seizures, found unresponsive. PMH: alcohol/cocaine use, anxiety, anemia, CHF, CKD, COPD, DM, essential HTN, GIB, HLD, cardiomyopathy, obesity, PAT (paroxysmal atrial tachycardia), OSA, CVA (2018).  Intubated 10/19-26/21. MRI = subtle acute/early subacute infarct Right basal ganglia. Numerous bilateral prior basal ganglia and thalamic microhemorrhages with additional larger area of prior hemorrhage. Small remote bilateral cerebellar lacunar infarcts.    PT Comments    Pt in bed on arrival.  Required + 2 Total Assist for all mobility.  General bed mobility comments: pt required + 2 Total Assist for all bed mobility.  Assisted to EOB present with poor dynamic and static balance. poor collapsed posture with flaccid L LE and poor inability to maintain midline.General transfer comment: "Bear Hug" partial SPS 1/4 turn from bed to recliner with inability to support her own weight and total buckle L LE.  [place] for nursing to return pt back to bed.  Positioned in recliner with pillow support L UE .    Follow Up Recommendations  SNF     Equipment Recommendations  None recommended by PT    Recommendations for Other Services       Precautions / Restrictions Precautions Precautions: Fall Precaution Comments: L hemiparesis (L UE flaccid) Restrictions Weight Bearing Restrictions: No    Mobility  Bed Mobility Overal bed mobility: Needs Assistance Bed Mobility: Rolling;Supine to Sit Rolling: Total assist;+2 for physical assistance;+2 for safety/equipment   Supine to sit: Total assist;+2 for physical assistance;+2 for safety/equipment     General bed mobility comments: pt required + 2 Total Assist for all bed mobility.  Assisted to EOB present with poor dynamic and static balance. poor collapsed  posture with flaccid L LE and poor inability to maintain midline.  Transfers Overall transfer level: Needs assistance Equipment used: None Transfers: Sit to/from Stand           General transfer comment: "Bear Hug" partial SPS 1/4 turn from bed to recliner with inability to support her own weight and total buckle L LE.  [place] for nursing to return pt back to bed.  Ambulation/Gait             General Gait Details: non amb at this time due to Hemiparesis   Stairs             Wheelchair Mobility    Modified Rankin (Stroke Patients Only)       Balance       Sitting balance - Comments: Unable to fully assess as pt refused to transition to EOB due to feeling tired.                                    Cognition Arousal/Alertness: Awake/alert (groggy) Behavior During Therapy: Flat affect Overall Cognitive Status: Impaired/Different from baseline Area of Impairment: Orientation;Attention;Memory;Following commands;Awareness;Safety/judgement                 Orientation Level: Disoriented to;Time Current Attention Level: Focused Memory: Decreased short-term memory Following Commands: Follows one step commands consistently Safety/Judgement: Decreased awareness of safety;Decreased awareness of deficits Awareness: Intellectual   General Comments: Oriented  to" I had a stroke", not place or time and quick to say "I can't"      Exercises  General Comments        Pertinent Vitals/Pain Pain Assessment: Faces Faces Pain Scale: Hurts even more Pain Location: buttocks/back Pain Descriptors / Indicators: Grimacing Pain Intervention(s): Monitored during session;Repositioned    Home Living Family/patient expects to be discharged to:: Private residence Living Arrangements: Children Available Help at Discharge: Available PRN/intermittently Type of Home: House Home Access: Stairs to enter Entrance Stairs-Rails: None Home  Layout: One level Home Equipment: Environmental consultant - 4 wheels;Shower seat;Bedside commode;Wheelchair - manual Additional Comments: All information confirmed with pt's brother over phone, as pt was showing signs of confusion and disorientation for OT evaluation.    Prior Function Level of Independence: Needs assistance    ADL's / Homemaking Assistance Needed: Pt received assitance at baseline since her CVA in 2018 with most ADLs including toileting, bathing, and dressing.  Pt reports that her 29 year son works days and helps when home, otherwise pt's female friend vs boyfriend assists during the day.  Pt reports being dependent for housekeeping, meals and laundry.  Pt reports that she rarely ambulates, and uses her WC, but pt's brother stated that as of 2 weeks ago, pt was ambulating Mod I in her home with use of Rollator.     PT Goals (current goals can now be found in the care plan section) Acute Rehab PT Goals Patient Stated Goal: To get rehab, but not to have therapy before Noon. Progress towards PT goals: Progressing toward goals    Frequency    Min 2X/week      PT Plan Current plan remains appropriate    Co-evaluation              AM-PAC PT "6 Clicks" Mobility   Outcome Measure  Help needed turning from your back to your side while in a flat bed without using bedrails?: Total Help needed moving from lying on your back to sitting on the side of a flat bed without using bedrails?: Total Help needed moving to and from a bed to a chair (including a wheelchair)?: Total Help needed standing up from a chair using your arms (e.g., wheelchair or bedside chair)?: Total Help needed to walk in hospital room?: Total Help needed climbing 3-5 steps with a railing? : Total 6 Click Score: 6    End of Session Equipment Utilized During Treatment: Gait belt Activity Tolerance: Patient limited by fatigue Patient left: in chair;with chair alarm set;with call bell/phone within reach Nurse  Communication: Mobility status;Need for lift equipment PT Visit Diagnosis: Other symptoms and signs involving the nervous system (R29.898);Hemiplegia and hemiparesis Hemiplegia - Right/Left: Left Hemiplegia - dominant/non-dominant: Non-dominant Hemiplegia - caused by: Nontraumatic intracerebral hemorrhage;Cerebral infarction     Time: 5465-0354 PT Time Calculation (min) (ACUTE ONLY): 31 min  Charges:  $Therapeutic Activity: 23-37 mins                     Rica Koyanagi  PTA Acute  Rehabilitation Services Pager      [phone] Office      [phone]

## 2020-08-26 NOTE — Progress Notes (Signed)
Modified Barium Swallow Progress Note  Patient Details  Name: [name] MRN: [id number] Date of Birth: 10-12-1966  Today's Date: 08/26/2020  Modified Barium Swallow completed.  Full report located under Chart Review in the Imaging Section.  Brief recommendations include the following:  Clinical Impression  Patient presents with improved swallow function as compared to previous MBS on 10/29. She did exhibit oral control delays with all tested boluses leading to premature spillage, but with full clearance of oral residuals. Swallow initiation at level of vallecular sinus with puree solids and nectar thick liquids. Puree solids transited orally and pharyngeally without difficulty and without residuals. Patient exhibited delay of swallow intiaition with thin liquids at level of vallecular sinus and trace penetration during the swallow. Penetrate would transit out of laryngeal vestibule but would not fully transit and this continued during exam. Aspiration did not occur at any time during this swallow study and amount of penetrate remained trace throughout. Thin liquids resulted in trace amount of vallecular, pyriform and posterior pharyngeal wall residuals. Patient is safe to initiate PO diet of Dys 1, thin liquids. She reported she is unable to masticate solids without dentures, which are not available here at the hospital.   Swallow Evaluation Recommendations       SLP Diet Recommendations: Thin liquid;Dysphagia 1 (Puree) solids   Liquid Administration via: Cup;Straw   Medication Administration: Whole meds with puree   Supervision: Full assist for feeding;Staff to assist with self feeding;Full supervision/cueing for compensatory strategies   Compensations: Slow rate;Small sips/bites;Minimize environmental distractions       Oral Care Recommendations: Oral care QID   Other Recommendations: Have oral suction available   Sonia Baller, MA, CCC-SLP Speech Therapy

## 2020-08-26 NOTE — Consult Note (Signed)
I have placed a request via Secure Chat to Dr. Dr. Nevada Crane  requesting photos of the wound areas of concern to be placed in the EMR.    [place], [place], [place]

## 2020-08-26 NOTE — Progress Notes (Signed)
Inpatient Diabetes Program Recommendations  AACE/ADA: New Consensus Statement on Inpatient Glycemic Control (2015)  Target Ranges:  Prepandial:   less than 140 mg/dL      Peak postprandial:   less than 180 mg/dL (1-2 hours)      Critically ill patients:  140 - 180 mg/dL   Lab Results  Component Value Date   GLUCAP 190 (H) 08/25/2020   HGBA1C 13.8 (H) 08/14/2020    Review of Glycemic Control  Diabetes history: DM2 Outpatient Diabetes medications: tradjenta 5 mg QD Current orders for Inpatient glycemic control: tradjenta 5 mg QD Diet - dysphagia 1.  Insulin on Hold d/t recent hypoglycemia. Blood sugar 190 mg/dL today.  Inpatient Diabetes Program Recommendations:     Novolog 0-15 units tidwc and hs Just started Dysphagia diet.  Pt will likely be d/ced to SNF.  Continue to follow.  Thank you. Lorenda Peck, RD, LDN, CDE Inpatient Diabetes Coordinator [phone]

## 2020-08-26 NOTE — Evaluation (Signed)
Occupational Therapy Evaluation Patient Details Name: [name] MRN: [id number] DOB: 05/12/1966 Today's Date: 08/26/2020    History of Present Illness HPI: 54yo female admitted 08/14/20 with seizures, found unresponsive. PMH: alcohol/cocaine use, anxiety, anemia, CHF, CKD, COPD, DM, essential HTN, GIB, HLD, cardiomyopathy, obesity, PAT (paroxysmal atrial tachycardia), OSA, CVA (2018).  Intubated 10/19-26/21. MRI = subtle acute/early subacute infarct Right basal ganglia. Numerous bilateral prior basal ganglia and thalamic microhemorrhages with additional larger area of prior hemorrhage. Small remote bilateral cerebellar lacunar infarcts.   Clinical Impression   Patient is currently requiring assistance with all ADLs including total assist with Bed level Bathing, dressing, toileting and grooming, and at least moderate assist with eating, all of which is below patient's typical baseline at home where pt was able to ambulate with a Rollator according to pt's brother on phone, although pt and brother both endorsed need of assistance with most ADLs since previous CVA in 2018.   During this evaluation, patient was limited by lethargy and LT sided hemiparesis to LLE and hemiplegia to LUE as well as LT hand dependent edema, all of which has the potential to impact patient's safety and independence during functional mobility, as well as performance for ADLs. [place] "6-clicks" Daily Activity Inpatient Short Form score of 8/24 indicates 85.69% ADL impairment this session. Patient lives with her 93 y/o son, who is able to provide PRN supervision and assistance.  Patient demonstrates fair rehab potential, and could benefit from continued skilled occupational therapy services while in acute care to maximize safety, independence and quality of life at home.  Continued occupational therapy services in a SNF setting prior to return home is recommended.  ?     Follow Up Recommendations     SNF-24 hour supervision   Equipment Recommendations    May need [employer].   Recommendations for Other Services       Precautions / Restrictions Precautions Precautions: Fall Precaution Comments: L hemiparesis Restrictions Weight Bearing Restrictions: No      Mobility Bed Mobility Overal bed mobility: Needs Assistance Bed Mobility: Rolling           General bed mobility comments: Total assist to scoot to [facility] in supine.  Pt did demonstrate x2 pulling her upper body off of the bed and turning Upper body, reaching with RT UE across midline for LT bed rail to push herself up briefly. Pt did this volitionally and not to command.    Transfers Overall transfer level: Needs assistance               General transfer comment: NT will need mechanical lift    Balance       Sitting balance - Comments: Unable to fully assess as pt refused to transition to EOB due to feeling tired.                                   ADL either performed or assessed with clinical judgement   ADL Overall ADL's : Needs assistance/impaired Eating/Feeding: Moderate assistance Eating/Feeding Details (indicate cue type and reason): Not observed. Anticipate need of assistance due to cognitive impairment and RUE weakness with LUE flaccidity. Grooming: Total assistance;Wash/dry hands;Bed level Grooming Details (indicate cue type and reason): Pt found with blood between RT hand 2nd/3rd digits and on palm of unknown origin. RN notified. Pt required total assist to perform RT hand hygiene and pt pulled hand away before completed,  stating, "that's good enough." Upper Body Bathing: Bed level;Total assistance Upper Body Bathing Details (indicate cue type and reason): Pt not engaging in ADLs, and requires Total assist for bed level UE sponge bathing. Lower Body Bathing: Bed level;+2 for physical assistance;Total assistance Lower Body Bathing Details (indicate cue type and reason): Per general  assessment. Upper Body Dressing : Maximal assistance   Lower Body Dressing: Bed level;Total assistance;+2 for physical assistance     Toilet Transfer Details (indicate cue type and reason): Unable to assess. Pt refused to transition to EOB. Toileting- Clothing Manipulation and Hygiene: Total assistance;+2 for physical assistance;Bed level       Functional mobility during ADLs: Total assistance;+2 for physical assistance General ADL Comments: Total assist of 2 for supine scoot in bed.     Vision Baseline Vision/History:  (Giving preference to RT gaze. Will not turn head to LT despite cues.) Patient Visual Report: Other (comment) Vision Assessment?: Vision impaired- to be further tested in functional context     Perception     Praxis      Pertinent Vitals/Pain Pain Assessment: No/denies pain     Hand Dominance Right   Extremity/Trunk Assessment Upper Extremity Assessment Upper Extremity Assessment: RUE deficits/detail;LUE deficits/detail RUE Deficits / Details: AROM: WFL, MMT: Shoulder 3-/5, elbow: 3+/5, grip: 3/5 RUE Coordination: decreased fine motor;decreased gross motor LUE Deficits / Details: PROM: WFL. MMT: Shoulder 0/5, elbow: flexion 2/5, extension 2-/5, wrist 0/5, grip: 0/5 with dorsal edema. LUE Coordination: decreased fine motor;decreased gross motor   Lower Extremity Assessment Lower Extremity Assessment: Defer to PT evaluation       Communication Communication Communication: Expressive difficulties (Dysarthric)   Cognition Arousal/Alertness: Lethargic (More alert towards end of evaluation.) Behavior During Therapy: Agitated;Impulsive Overall Cognitive Status: Impaired/Different from baseline Area of Impairment: Orientation;Attention;Memory;Following commands;Awareness;Safety/judgement                 Orientation Level: Disoriented to;Time   Memory: Decreased short-term memory Following Commands: Follows one step commands  consistently Safety/Judgement: Decreased awareness of safety;Decreased awareness of deficits         General Comments       Exercises     Shoulder Instructions      Home Living Family/patient expects to be discharged to:: Private residence Living Arrangements: Children Available Help at Discharge: Available PRN/intermittently Type of Home: House Home Access: Stairs to enter [employer] of Steps: 3 Entrance Stairs-Rails: None Home Layout: One level     Bathroom Shower/Tub: Corporate investment banker: Standard     Home Equipment: Environmental consultant - 4 wheels;Shower seat;Bedside commode;Wheelchair - manual   Additional Comments: All information confirmed with pt's brother over phone, as pt was showing signs of confusion and disorientation for OT evaluation.      Prior Functioning/Environment Level of Independence: Needs assistance    ADL's / Homemaking Assistance Needed: Pt received assitance at baseline since her CVA in 2018 with most ADLs including toileting, bathing, and dressing.  Pt reports that her 15 year son works days and helps when home, otherwise pt's female friend vs boyfriend assists during the day.  Pt reports being dependent for housekeeping, meals and laundry.  Pt reports that she rarely ambulates, and uses her WC, but pt's brother stated that as of 2 weeks ago, pt was ambulating Mod I in her home with use of Rollator. Communication / Swallowing Assistance Needed: Dysarthric. Pt being followed by SLP          OT Problem List: Decreased strength;Decreased coordination;Decreased  range of motion;Decreased cognition;Impaired sensation;Increased edema;Decreased activity tolerance;Decreased safety awareness;Impaired tone;Obesity;Decreased knowledge of use of DME or AE;Impaired balance (sitting and/or standing);Impaired vision/perception;Decreased knowledge of precautions;Impaired UE functional use      OT Treatment/Interventions: Self-care/ADL  training;Therapeutic exercise;Therapeutic activities;Splinting;Neuromuscular education;Cognitive remediation/compensation;Patient/family education;Visual/perceptual remediation/compensation;DME and/or AE instruction;Manual therapy;Balance training    OT Goals(Current goals can be found in the care plan section) Acute Rehab OT Goals Patient Stated Goal: To get rehab, but not to have therapy before Noon. OT Goal Formulation: With patient Time For Goal Achievement: 09/09/20 Potential to Achieve Goals: Fair ADL Goals Pt Will Perform Eating: with min assist;sitting;bed level (Using dominant RT UE) Pt Will Perform Grooming: bed level;sitting;with mod assist Pt Will Perform Upper Body Dressing: with mod assist;bed level;sitting;with adaptive equipment;with caregiver independent in assisting Pt Will Perform Lower Body Dressing: with caregiver independent in assisting;bed level;with mod assist Pt Will Transfer to Toilet: with mod assist;with +2 assist;bedside commode (Drop arm BSC using LRAD) Pt/caregiver will Perform Home Exercise Program: Increased ROM;Increased strength;Both right and left upper extremity;With minimal assist (Using NMR to LUE) Additional ADL Goal #1: Pt will demonstrate correct positioning of LUE in bed and in recliner in order to manage dependent hand edema, and protect limb from injury. Additional ADL Goal #2: Pt will sit at EOB for 10 min with at least fair sitting balance in order to increase safety for seated ADLs.  OT Frequency: Min 2X/week   Barriers to D/C:    3 steps to enter home.       Co-evaluation              AM-PAC OT "6 Clicks" Daily Activity     Outcome Measure Help from another person eating meals?: A Lot Help from another person taking care of personal grooming?: Total Help from another person toileting, which includes using toliet, bedpan, or urinal?: Total Help from another person bathing (including washing, rinsing, drying)?: Total Help from  another person to put on and taking off regular upper body clothing?: A Lot Help from another person to put on and taking off regular lower body clothing?: Total 6 Click Score: 8   End of Session Nurse Communication: Mobility status  Activity Tolerance: Patient limited by fatigue Patient left: in bed;with call bell/phone within reach;with bed alarm set;with nursing/sitter in room  OT Visit Diagnosis: Hemiplegia and hemiparesis;Muscle weakness (generalized) (M62.81);History of falling (Z91.81);Other symptoms and signs involving cognitive function Hemiplegia - Right/Left: Left Hemiplegia - dominant/non-dominant: Non-Dominant Hemiplegia - caused by: Cerebral infarction                Time: 8828-0034 OT Time Calculation (min): 16 min Charges:  OT General Charges $OT Visit: 1 Visit OT Evaluation $OT Eval Low Complexity: 1 Low  Kirtis Challis, OT Acute Rehab Services Office: [phone] 08/26/2020  Julien Girt 08/26/2020, 11:31 AM

## 2020-08-26 NOTE — TOC Progression Note (Signed)
Transition of Care [facility]) - Progression Note    Patient Details  Name: [name] MRN: [id number] Date of Birth: October 18, 1966  [facility] CM/SW Contact  Shade Flood, LCSW Phone Number: 08/26/2020, 11:08 AM  Clinical Narrative:     TOC following. Spoke with pt's brother to update on SNF bed offers and [facility] in [place] is selected. SLP notes indicate pt is currently NPO due to inability to swallow and concern for aspiration. Unclear when pt will be medically ready for dc.  Updated [name] at [facility] of pt's acceptance of bed offer. TOC will start insurance authorization when pt's dc timeframe is determined.  Expected Discharge Plan: [place] Barriers to Discharge: Continued Medical Work up, SNF Pending bed offer  Expected Discharge Plan and Services Expected Discharge Plan: [place] In-house Referral: Clinical Social Work   Post Acute Care Choice: [place] Living arrangements for the past 2 months: Single Family Home                                       Social Determinants of Health (SDOH) Interventions    Readmission Risk Interventions No flowsheet data found.

## 2020-08-27 DIAGNOSIS — J9600 Acute respiratory failure, unspecified whether with hypoxia or hypercapnia: Secondary | ICD-10-CM | POA: Diagnosis not present

## 2020-08-27 LAB — GLUCOSE, CAPILLARY
Glucose-Capillary: 145 mg/dL — ABNORMAL HIGH (ref 70–99)
Glucose-Capillary: 173 mg/dL — ABNORMAL HIGH (ref 70–99)
Glucose-Capillary: 161 mg/dL — ABNORMAL HIGH (ref 70–99)
Glucose-Capillary: 191 mg/dL — ABNORMAL HIGH (ref 70–99)

## 2020-08-27 LAB — BASIC METABOLIC PANEL
Anion gap: 9 (ref 5–15)
BUN: 11 mg/dL (ref 6–20)
CO2: 24 mmol/L (ref 22–32)
Calcium: 8.1 mg/dL — ABNORMAL LOW (ref 8.9–10.3)
Chloride: 110 mmol/L (ref 98–111)
Creatinine, Ser: 1.2 mg/dL — ABNORMAL HIGH (ref 0.44–1.00)
GFR, Estimated: 53 mL/min — ABNORMAL LOW (ref >60)
Glucose, Bld: 181 mg/dL — ABNORMAL HIGH (ref 70–99)
Potassium: 3.6 mmol/L (ref 3.5–5.1)
Sodium: 143 mmol/L (ref 135–145)

## 2020-08-27 LAB — SARS CORONAVIRUS 2 BY RT PCR (HOSPITAL ORDER, PERFORMED IN ~~LOC~~ HOSPITAL LAB): SARS Coronavirus 2: NEGATIVE

## 2020-08-27 MED ORDER — TRAZODONE HCL 50 MG PO TABS
50.0000 mg | ORAL_TABLET | Freq: Every day | ORAL | Status: DC
  Administered 2020-08-27: 50 mg via ORAL
  Filled 2020-08-27: qty 1

## 2020-08-27 MED ORDER — CLONIDINE HCL 0.2 MG PO TABS
0.3000 mg | ORAL_TABLET | Freq: Every day | ORAL | Status: DC
  Administered 2020-08-27: 0.3 mg via ORAL
  Filled 2020-08-27: qty 1

## 2020-08-27 MED ORDER — GABAPENTIN 300 MG PO CAPS
300.0000 mg | ORAL_CAPSULE | Freq: Three times a day (TID) | ORAL | Status: DC
  Administered 2020-08-27 – 2020-08-28 (×4): 300 mg via ORAL
  Filled 2020-08-27 (×4): qty 1

## 2020-08-27 MED ORDER — DULOXETINE HCL 60 MG PO CPEP
60.0000 mg | ORAL_CAPSULE | Freq: Every day | ORAL | Status: DC
  Administered 2020-08-27 – 2020-08-28 (×2): 60 mg via ORAL
  Filled 2020-08-27 (×2): qty 1

## 2020-08-27 MED ORDER — CLOPIDOGREL BISULFATE 75 MG PO TABS
75.0000 mg | ORAL_TABLET | Freq: Every day | ORAL | Status: DC
  Administered 2020-08-27 – 2020-08-28 (×2): 75 mg via ORAL
  Filled 2020-08-27 (×2): qty 1

## 2020-08-27 MED ORDER — DARIFENACIN HYDROBROMIDE ER 7.5 MG PO TB24
7.5000 mg | ORAL_TABLET | Freq: Every day | ORAL | Status: DC
  Administered 2020-08-27 – 2020-08-28 (×2): 7.5 mg via ORAL
  Filled 2020-08-27 (×2): qty 1

## 2020-08-27 MED ORDER — LINAGLIPTIN 5 MG PO TABS
5.0000 mg | ORAL_TABLET | Freq: Every day | ORAL | Status: DC
  Administered 2020-08-27 – 2020-08-28 (×2): 5 mg via ORAL
  Filled 2020-08-27 (×2): qty 1

## 2020-08-27 MED ORDER — INSULIN ASPART 100 UNIT/ML ~~LOC~~ SOLN
0.0000 [IU] | Freq: Three times a day (TID) | SUBCUTANEOUS | Status: DC
  Administered 2020-08-27: 2 [IU] via SUBCUTANEOUS
  Administered 2020-08-27: 3 [IU] via SUBCUTANEOUS
  Administered 2020-08-28 (×2): 5 [IU] via SUBCUTANEOUS

## 2020-08-27 MED ORDER — IRBESARTAN 300 MG PO TABS
300.0000 mg | ORAL_TABLET | Freq: Every day | ORAL | Status: DC
  Administered 2020-08-27 – 2020-08-28 (×2): 300 mg via ORAL
  Filled 2020-08-27 (×2): qty 1

## 2020-08-27 MED ORDER — INSULIN ASPART 100 UNIT/ML ~~LOC~~ SOLN: 0.0000 [IU] | Freq: Every day | SUBCUTANEOUS | Status: DC

## 2020-08-27 MED ORDER — LEVETIRACETAM 500 MG PO TABS
750.0000 mg | ORAL_TABLET | Freq: Two times a day (BID) | ORAL | Status: DC
  Administered 2020-08-27 – 2020-08-28 (×3): 750 mg via ORAL
  Filled 2020-08-27 (×3): qty 1

## 2020-08-27 MED ORDER — AMLODIPINE BESYLATE 10 MG PO TABS
10.0000 mg | ORAL_TABLET | Freq: Every day | ORAL | Status: DC
  Administered 2020-08-27 – 2020-08-28 (×2): 10 mg via ORAL
  Filled 2020-08-27 (×2): qty 1

## 2020-08-27 MED ORDER — GABAPENTIN 250 MG/5ML PO SOLN: 300.0000 mg | Freq: Three times a day (TID) | ORAL | Status: DC

## 2020-08-27 MED ORDER — APIXABAN 5 MG PO TABS
5.0000 mg | ORAL_TABLET | Freq: Two times a day (BID) | ORAL | Status: DC
  Administered 2020-08-27 – 2020-08-28 (×2): 5 mg via ORAL
  Filled 2020-08-27 (×2): qty 1

## 2020-08-27 NOTE — Progress Notes (Signed)
  Speech Language Pathology Treatment: Dysphagia  Patient Details Name: [name] MRN: [id number] DOB: 05-Jul-1966 Today's Date: 08/27/2020 Time: 4562-5638 SLP Time Calculation (min) (ACUTE ONLY): 20 min  Assessment / Plan / Recommendation Clinical Impression  Pt was seen for follow up after PO diet of puree solids and thin liquids was initiated 08/26/20. Pt was seen during lunch, self feeding puree textures and drinking thin liquids via straw. No overt s/s aspiration observed, and no wet voice quality noted. Nursing reports no difficulty. Pt was adamant that she was unable to feed herself. SLP encouraged pt that this was a good way to build endurance and strength. Will continue dys 1 diet and thin liquids at this time, and will follow for readiness to advance solids. Safe swallow precautions posted at [facility].    HPI HPI: 54yo female admitted 08/14/20 with seizures, found unresponsive. PMH: alcohol/cocaine use, anxiety, anemia, CHF, CKD, COPD, DM, essential HTN, GIB, HLD, cardiomyopathy, obesity, PAT (paroxysmal atrial tachycardia), OSA, CVA (2018).  Intubated 10/19-26/21. MRI = subtle acute/early subacute infarct Right basal ganglia. Numerous bilateral prior basal ganglia and thalamic microhemorrhages with additional larger area of prior hemorrhage. Small remote bilateral cerebellar lacunar infarcts.      SLP Plan  Continue with current plan of care       Recommendations  Diet recommendations: Dysphagia 1 (puree);Thin liquid Liquids provided via: Cup;Straw Medication Administration: Crushed with puree Supervision: Staff to assist with self feeding Compensations: Slow rate;Small sips/bites;Minimize environmental distractions Postural Changes and/or Swallow Maneuvers: Seated upright 90 degrees                Oral Care Recommendations: Oral care BID Follow up Recommendations: 24 hour supervision/assistance;Skilled Nursing facility;Inpatient Rehab SLP Visit Diagnosis: Dysphagia,  oropharyngeal phase (R13.12) Plan: Continue with current plan of care       GO               Kaspian Muccio B. [name] Ore, [facility], [place] Speech Language Pathologist Office: [phone] Pager: [phone]  Shonna Chock 08/27/2020, 2:54 PM

## 2020-08-27 NOTE — Progress Notes (Signed)
PROGRESS NOTE  [name] [id number]:[id number] DOB: 24-Apr-1966 DOA: 08/13/2020 PCP: [name], MD  HPI/Recap of past 47 hours: 54 year old African-American female with past medical history significant for essential hypertension, chronic anxiety/depression, asthma, hyperlipidemia, type 2 diabetes, polyneuropathy, paroxysmal A. fib on Eliquis, chronic combined diastolic and systolic CHF, prior CVA, CKD stage III A, polysubstance abuse including cocaine who presented to [name] ED after she was found unresponsive by her boyfriend. She was last seen well earlier in the evening of 08/13/2020. EMS was called and found her with seizure activity.  In the ED she was intubated to protect her airway. She was seen by teleneurology who recommended to continue Keppra 1 g twice daily and to obtain routine EEG as well of MRI brain with and without contrast. CT head without contrast showed no acute findings. PCCM was called and accepted the patient to their service.  Due to no beds availability at [facility], the patient was transferred to [facility] long for further management.   UDS + cocaine 08/13/20.  Extubated on 08/20/20.  NG tube placed due to concern for dysphagia.  Patient became very agitated on 08/22/20 removed her NG tube.  She was placed on Precedex briefly due to severe agitation then the Precedex was turned off in less than 24 hours.  She failed her swallow evaluation by speech therapist on 08/23/20.  MBS was completed on 08/23/2020, revealing high risk for aspiration with recommendation for n.p.o. from speech therapist.  Attempted Panda tube placement at bedside but unsuccessful, patient declined 3rd Trial.  Reassessed by speech therapist on 08/25/20 who noted improvement in her dysphagia.  On 08/26/20 seen by speech therapist who recommended dysphagia 1 diet.  Tolerating well.  Aspiration precautions in place.  08/27/20: No new complaints this morning.  All her medications have been switched  back to p.o, including Keppra.  She is currently on dysphagia 1 pure, thin liquid diet.  Tolerating well.  Working on SNF placement.   Assessment/Plan: Active Problems:   Acute respiratory failure requiring reintubation (HCC)   Lactic acid acidosis   AKI (acute kidney injury) ([place])   Atrial fibrillation with rapid ventricular response (HCC)   Pressure injury of skin   Respiratory failure ([place])   Grand mal seizure (HCC)   Fever  Resolved toxic metabolic encephalopathy likely multifactorial secondary to new onset seizure, hypoglycemia, acute right basal ganglia stroke, cocaine abuse Briefly required Precedex on 08/22/2020 due to severe agitation, off Precedex within 24-hour of starting the infusion. Switched IV Keppra to p.o. Keppra 750 mg twice daily as the patient is tolerating oral intake well.   Continue fall precautions and reorient as needed.  Improving severe dysphagia Reassessed by speech therapist on 08/25/20 who noted improvement in her dysphagia.  On 08/26/20 speech therapist recommended dysphagia 1 diet, thin liquid. Continue aspiration precautions.  Resolved hypoglycemia, D5W discontinued on 08/26/2020  Type 2 diabetes, with resolved recent severe hypoglycemia requiring D10 and D5W Hemoglobin A1c 13.8 on 08/14/2020. Continue Tradjenta Restarted insulin sliding scale, moderate. CBGs have been stable in the 140s to 170s.  New onset seizure in the setting of acute right basal ganglia stroke Seen by neurology Continue AEDs, p.o. Keppra. Continue seizure precautions Will need to follow-up with neurology outpatient  Acute right basal ganglia stroke PT OT recommending SNF Speech therapy following Continue fall and aspiration precautions  Paroxysmal A. Fib Currently rate controlled On Eliquis prior to admission Restart home Eliquis and DC full dose Lovenox.  Essential hypertension BP  is not at goal Resume home oral antihypertensives IV antihypertensives as needed  with parameters.  Polysubstance abuse UDS positive for cocaine 08/13/2020 Tobacco use disorder Polysubstance abuse cessation counseling   Physical debility PT OT recommended SNF Fall precautions Continue PT OT with assistance of upper question TOC assisting with SNF placement.  Code Status: Full code  Family Communication: Updated her daughter via phone on 08/25/2020.  Disposition Plan: SNF when bed is available.   Consultants:  Neurology  PCCM  Procedures: Intubation Extubation MBS  Antimicrobials:  None  DVT prophylaxis:  Full dose Lovenox subcu twice daily.  Status is: Inpatient    Dispo:  Patient From: Home  Planned Disposition:SNF.              Expected discharge date: 08/28/20  Medically stable for discharge: Yes, pending bed placement.       Objective: Vitals:   08/26/20 2047 08/27/20 0500 08/27/20 0537 08/27/20 1338  BP: (!) 162/86  (!) 160/95 (!) 145/102  Pulse: 95  87 96  Resp: '16  18 18  ' Temp: 97.8 F (36.6 C)  98.7 F (37.1 C) 99.5 F (37.5 C)  TempSrc: Oral  Oral Oral  SpO2: 98%  (!) 86% 97%  Weight:  98.9 kg    Height:        Intake/Output Summary (Last 24 hours) at 08/27/2020 1706 Last data filed at 08/27/2020 1300 Gross per 24 hour  Intake 100 ml  Output --  Net 100 ml   Filed Weights   08/24/20 0425 08/26/20 0948 08/27/20 0500  Weight: 97 kg 94.8 kg 98.9 kg    Exam:  . General: 54 y.o. year-old female pleasant well-developed well-nourished in no acute distress.  Alert and interactive.  . Cardiovascular: Regular rate and rhythm no rubs or gallops. Marland Kitchen Respiratory: Clear to auscultation no wheezes no rales. . Abdomen: Soft nontender normal bowel sounds present. . Musculoskeletal: No lower extremity edema bilaterally. Marland Kitchen Psychiatry: Mood is appropriate for condition and setting.        08/26/20 pressure sacral wound    Data Reviewed: CBC: Recent Labs  Lab 08/22/20 0226 08/23/20 0947 08/24/20 0240  08/25/20 0312 08/26/20 0610  WBC 9.6 8.1 8.4 7.8 8.1  HGB 12.6 12.5 11.4* 10.5* 10.9*  HCT 41.2 40.9 36.5 33.8* 34.9*  MCV 96.3 96.2 93.1 97.4 95.1  PLT 247 246 239 236 539   Basic Metabolic Panel: Recent Labs  Lab 08/21/20 0228 08/21/20 0228 08/23/20 0947 08/24/20 0240 08/25/20 0312 08/26/20 0455 08/27/20 0735  NA 144   < > 143 143 143 143 143  K 4.6   < > 4.1 4.5 4.8 4.1 3.6  CL 114*   < > 110 109 111 111 110  CO2 21*   < > '22 22 22 ' 19* 24  GLUCOSE 83   < > 146* 228* 203* 190* 181*  BUN 29*   < > 24* 21* '16 14 11  ' CREATININE 1.52*   < > 1.33* 1.31* 1.32* 1.22* 1.20*  CALCIUM 8.7*   < > 8.6* 8.6* 8.3* 8.5* 8.1*  MG 1.9  --   --   --   --   --   --    < > = values in this interval not displayed.   GFR: Estimated Creatinine Clearance: 56.4 mL/min (A) (by C-G formula based on SCr of 1.2 mg/dL (H)). Liver Function Tests: No results for input(s): AST, ALT, ALKPHOS, BILITOT, PROT, ALBUMIN in the last 168 hours. No results for input(s):  LIPASE, AMYLASE in the last 168 hours. No results for input(s): AMMONIA in the last 168 hours. Coagulation Profile: No results for input(s): INR, PROTIME in the last 168 hours. Cardiac Enzymes: No results for input(s): CKTOTAL, CKMB, CKMBINDEX, TROPONINI in the last 168 hours. BNP (last 3 results) No results for input(s): PROBNP in the last 8760 hours. HbA1C: No results for input(s): HGBA1C in the last 72 hours. CBG: Recent Labs  Lab 08/25/20 0332 08/25/20 0738 08/27/20 0727 08/27/20 1119 08/27/20 1612  GLUCAP 165* 190* 145* 173* 161*   Lipid Profile: No results for input(s): CHOL, HDL, LDLCALC, TRIG, CHOLHDL, LDLDIRECT in the last 72 hours. Thyroid Function Tests: No results for input(s): TSH, T4TOTAL, FREET4, T3FREE, THYROIDAB in the last 72 hours. Anemia Panel: No results for input(s): VITAMINB12, FOLATE, FERRITIN, TIBC, IRON, RETICCTPCT in the last 72 hours. Urine analysis:    Component Value Date/Time   COLORURINE STRAW  (A) 08/13/2020 2027   APPEARANCEUR CLEAR 08/13/2020 2027   LABSPEC 1.015 08/13/2020 2027   PHURINE 7.0 08/13/2020 2027   GLUCOSEU >=500 (A) 08/13/2020 2027   HGBUR SMALL (A) 08/13/2020 2027   BILIRUBINUR NEGATIVE 08/13/2020 2027   KETONESUR NEGATIVE 08/13/2020 2027   PROTEINUR >300 (A) 08/13/2020 2027   UROBILINOGEN 0.2 09/11/2014 1319   NITRITE NEGATIVE 08/13/2020 2027   LEUKOCYTESUR NEGATIVE 08/13/2020 2027   Sepsis Labs: '@LABRCNTIP' (procalcitonin:4,lacticidven:4)  ) Recent Results (from the past 240 hour(s))  SARS Coronavirus 2 by RT PCR (hospital order, performed in [place] hospital lab) Nasopharyngeal Nasopharyngeal Swab     Status: None   Collection Time: 08/27/20  1:42 PM   Specimen: Nasopharyngeal Swab  Result Value Ref Range Status   SARS Coronavirus 2 NEGATIVE NEGATIVE Final    Comment: (NOTE) SARS-CoV-2 target nucleic acids are NOT DETECTED.  The SARS-CoV-2 RNA is generally detectable in upper and lower respiratory specimens during the acute phase of infection. The lowest concentration of SARS-CoV-2 viral copies this assay can detect is 250 copies / mL. A negative result does not preclude SARS-CoV-2 infection and should not be used as the sole basis for treatment or other patient management decisions.  A negative result may occur with improper specimen collection / handling, submission of specimen other than nasopharyngeal swab, presence of viral mutation(s) within the areas targeted by this assay, and inadequate number of viral copies (<250 copies / mL). A negative result must be combined with clinical observations, patient history, and epidemiological information.  Fact Sheet for Patients:   StrictlyIdeas.no  Fact Sheet for Healthcare Providers: BankingDealers.co.za  This test is not yet approved or  cleared by the [place] FDA and has been authorized for detection and/or diagnosis of SARS-CoV-2 by FDA  under an Emergency Use Authorization (EUA).  This EUA will remain in effect (meaning this test can be used) for the duration of the COVID-19 declaration under Section 564(b)(1) of the Act, 21 U.S.C. section 360bbb-3(b)(1), unless the authorization is terminated or revoked sooner.  Performed at [facility], [place] [address], [place], [place] [address]       Studies: No results found.  Scheduled Meds: . amLODipine  10 mg Oral Daily  . chlorhexidine gluconate (MEDLINE KIT)  15 mL Mouth Rinse BID  . cloNIDine  0.3 mg Oral QHS  . clopidogrel  75 mg Oral Q breakfast  . clotrimazole  1 Applicatorful Vaginal QHS  . collagenase   Topical Daily  . darifenacin  7.5 mg Oral Daily  . DULoxetine  60 mg Oral  Daily  . enoxaparin (LOVENOX) injection  1 mg/kg Subcutaneous Q12H  . ezetimibe  10 mg Oral Daily  . gabapentin  300 mg Oral TID  . insulin aspart  0-15 Units Subcutaneous TID WC  . insulin aspart  0-5 Units Subcutaneous QHS  . irbesartan  300 mg Oral Daily  . levETIRAcetam  750 mg Oral BID  . linagliptin  5 mg Oral Daily  . metoprolol tartrate  5 mg Intravenous Q8H  . montelukast  10 mg Oral QHS  . nicotine  21 mg Transdermal Daily  . pantoprazole (PROTONIX) IV  40 mg Intravenous Q24H  . pneumococcal 23 valent vaccine  0.5 mL Intramuscular Tomorrow-1000  . rosuvastatin  10 mg Oral Daily  . traZODone  50 mg Oral QHS    Continuous Infusions: . sodium chloride Stopped (08/26/20 0944)  . lactated ringers Stopped (08/20/20 2303)     LOS: 14 days     Kayleen Memos, MD [facility] Pager [phone]  If 7PM-7AM, please contact night-coverage [email or web address] Password [facility] 08/27/2020, 5:06 PM

## 2020-08-27 NOTE — TOC Progression Note (Addendum)
Transition of Care [facility]) - Progression Note    Patient Details  Name: [name] MRN: [id number] Date of Birth: 1966/10/26  Transition of Care [facility] CM/SW Contact  Lura Falor, Juliann Pulse, RN Phone Number: 08/27/2020, 10:20 AM  Clinical Narrative: [name] w/[facility] AF#[id number] Josem Kaufmann. Faxed clinicals w/confirmation. SNF [name] rep following.Will need covid ordered in am.  Patient declines covid vaccine. 12:21p-Received Josem Kaufmann [id number] 11/2-11/4 NRD 11/4 fax clinicals [phone].covid to be ordered today.    Expected Discharge Plan: Skilled Nursing Facility Barriers to Discharge: Insurance Authorization  Expected Discharge Plan and Services Expected Discharge Plan: [place] In-house Referral: Clinical Social Work   Post Acute Care Choice: [place] Living arrangements for the past 2 months: Single Family Home                                       Social Determinants of Health (SDOH) Interventions    Readmission Risk Interventions No flowsheet data found.

## 2020-08-27 NOTE — Plan of Care (Signed)
  Problem: Clinical Measurements: Goal: Respiratory complications will improve Outcome: Progressing   Problem: Nutrition: Goal: Adequate nutrition will be maintained Outcome: Progressing   

## 2020-08-27 NOTE — Progress Notes (Signed)
Nutrition Follow-up  DOCUMENTATION CODES:   Obesity unspecified  INTERVENTION:  - continue diet per SLP recommendations. - Magic Cup TID with meals per diet order protocol, each supplement provides 290 kcal and 9 grams of protein.   NUTRITION DIAGNOSIS:   Inadequate oral intake related to lethargy/confusion, other (see comment) (recently advanced diet) as evidenced by other (comment) (flow sheet and RN report). -revised  GOAL:   Patient will meet greater than or equal to 90% of their needs -unmet at this time.  MONITOR:   PO intake, Supplement acceptance, Labs, Weight trends  ASSESSMENT:   54 y.o. female with medical history of HTN, type 2 DM, afib on Eliquis, seizures, tobacco/polysubstance abuse, non-ischemic cardiomyopathy, stage 3 CKD, depression, prior CVA, and GERD. She presented to the ED after being found unresponsive on 10/19 night by her boyfriend. EMS was called and she was intubated after arrival to the [facility] ED before transferring to [facility].  Significant Events:  10/20- intubation in the [facility] ED and transfer to [facility]; OGT placed 10/21- initial RD assessment 10/22- TF initiation  10/26- extubation and OGT removed 10/27- small bore NGT placement; re-initiation of TF 10/28- small bore NGT removed 11/1- diet advanced to Dysphagia 1, thin liquid s/p MBS   Weight has been fluctuating slightly throughout admission. Able to talk with RN. She reports that patient has been very drowsy this AM, mainly unarousable. She states patient will open here eyes and then promptly fall back to sleep. RN confirms that patient has not had breakfast or anything PO this AM in light of this.   Per flow sheet documentation, she ate 50% of lunch yesterday (457 kcal, 24 grams protein).   Labs reviewed; CBGs: 174, 165, 190, 145, 173 mg/dl, creatinine: 1.2 mg/dl, Ca: 8.1 mg/dl, GFR: 53 ml/min. Medications reviewed; sliding scale novolog, 40 mg IV protonix/day.   Diet Order:   Diet  Order            DIET - DYS 1 Room service appropriate? Yes with Assist; Fluid consistency: Thin  Diet effective now                 EDUCATION NEEDS:   No education needs have been identified at this time  Skin:  Skin Assessment: Skin Integrity Issues: Skin Integrity Issues:: Stage II, Other (Comment) Stage II: sacrum (newly documented 10/26) Other: skin tear to sacrum  Last BM:  10/30  Height:   Ht Readings from Last 1 Encounters:  08/14/20 5\' 1"  (1.549 m)    Weight:   Wt Readings from Last 1 Encounters:  08/27/20 98.9 kg     Estimated Nutritional Needs:  Kcal:  1820-2065 kcal Protein:  90-105 grams Fluid:  >/= 1.8 L/day     Jarome Matin, MS, RD, LDN, CNSC Inpatient Clinical Dietitian RD pager # available in AMION  After hours/weekend pager # available in [facility]

## 2020-08-27 NOTE — Consult Note (Signed)
   [facility] Inpatient Consult   08/27/2020  [name] 1966/09/16 [id number]  Patient screened for high risk score for unplanned readmission (36%). Chart reviewed to assess for potential [facility] community service needs. Per review, current disposition plan is for skilled nursing facility. No [facility] CM identifiable needs at this time.  Netta Cedars, MSN, [facility] Liaison Nurse Mobile Phone [phone]  Toll free office [phone]

## 2020-08-28 DIAGNOSIS — E11622 Type 2 diabetes mellitus with other skin ulcer: Secondary | ICD-10-CM | POA: Diagnosis not present

## 2020-08-28 DIAGNOSIS — M6281 Muscle weakness (generalized): Secondary | ICD-10-CM | POA: Diagnosis not present

## 2020-08-28 DIAGNOSIS — L89320 Pressure ulcer of left buttock, unstageable: Secondary | ICD-10-CM | POA: Diagnosis not present

## 2020-08-28 DIAGNOSIS — G894 Chronic pain syndrome: Secondary | ICD-10-CM | POA: Diagnosis not present

## 2020-08-28 DIAGNOSIS — G629 Polyneuropathy, unspecified: Secondary | ICD-10-CM | POA: Diagnosis not present

## 2020-08-28 DIAGNOSIS — R062 Wheezing: Secondary | ICD-10-CM | POA: Diagnosis not present

## 2020-08-28 DIAGNOSIS — G619 Inflammatory polyneuropathy, unspecified: Secondary | ICD-10-CM | POA: Diagnosis not present

## 2020-08-28 DIAGNOSIS — Z23 Encounter for immunization: Secondary | ICD-10-CM | POA: Diagnosis not present

## 2020-08-28 DIAGNOSIS — Z7401 Bed confinement status: Secondary | ICD-10-CM | POA: Diagnosis not present

## 2020-08-28 DIAGNOSIS — R262 Difficulty in walking, not elsewhere classified: Secondary | ICD-10-CM | POA: Diagnosis not present

## 2020-08-28 DIAGNOSIS — R2681 Unsteadiness on feet: Secondary | ICD-10-CM | POA: Diagnosis not present

## 2020-08-28 DIAGNOSIS — I10 Essential (primary) hypertension: Secondary | ICD-10-CM | POA: Diagnosis not present

## 2020-08-28 DIAGNOSIS — I639 Cerebral infarction, unspecified: Secondary | ICD-10-CM | POA: Diagnosis not present

## 2020-08-28 DIAGNOSIS — R4781 Slurred speech: Secondary | ICD-10-CM | POA: Diagnosis not present

## 2020-08-28 DIAGNOSIS — I480 Paroxysmal atrial fibrillation: Secondary | ICD-10-CM | POA: Diagnosis not present

## 2020-08-28 DIAGNOSIS — I69054 Hemiplegia and hemiparesis following nontraumatic subarachnoid hemorrhage affecting left non-dominant side: Secondary | ICD-10-CM | POA: Diagnosis not present

## 2020-08-28 DIAGNOSIS — E785 Hyperlipidemia, unspecified: Secondary | ICD-10-CM | POA: Diagnosis not present

## 2020-08-28 DIAGNOSIS — R2689 Other abnormalities of gait and mobility: Secondary | ICD-10-CM | POA: Diagnosis not present

## 2020-08-28 DIAGNOSIS — I69354 Hemiplegia and hemiparesis following cerebral infarction affecting left non-dominant side: Secondary | ICD-10-CM | POA: Diagnosis not present

## 2020-08-28 DIAGNOSIS — I509 Heart failure, unspecified: Secondary | ICD-10-CM | POA: Diagnosis not present

## 2020-08-28 DIAGNOSIS — G9340 Encephalopathy, unspecified: Secondary | ICD-10-CM | POA: Diagnosis not present

## 2020-08-28 DIAGNOSIS — J9601 Acute respiratory failure with hypoxia: Secondary | ICD-10-CM | POA: Diagnosis not present

## 2020-08-28 DIAGNOSIS — I1310 Hypertensive heart and chronic kidney disease without heart failure, with stage 1 through stage 4 chronic kidney disease, or unspecified chronic kidney disease: Secondary | ICD-10-CM | POA: Diagnosis not present

## 2020-08-28 DIAGNOSIS — I5030 Unspecified diastolic (congestive) heart failure: Secondary | ICD-10-CM | POA: Diagnosis not present

## 2020-08-28 DIAGNOSIS — N1832 Chronic kidney disease, stage 3b: Secondary | ICD-10-CM | POA: Diagnosis not present

## 2020-08-28 DIAGNOSIS — G9341 Metabolic encephalopathy: Secondary | ICD-10-CM | POA: Diagnosis not present

## 2020-08-28 DIAGNOSIS — M2550 Pain in unspecified joint: Secondary | ICD-10-CM | POA: Diagnosis not present

## 2020-08-28 DIAGNOSIS — E118 Type 2 diabetes mellitus with unspecified complications: Secondary | ICD-10-CM | POA: Diagnosis not present

## 2020-08-28 DIAGNOSIS — E119 Type 2 diabetes mellitus without complications: Secondary | ICD-10-CM | POA: Diagnosis not present

## 2020-08-28 DIAGNOSIS — Z743 Need for continuous supervision: Secondary | ICD-10-CM | POA: Diagnosis not present

## 2020-08-28 DIAGNOSIS — J449 Chronic obstructive pulmonary disease, unspecified: Secondary | ICD-10-CM | POA: Diagnosis not present

## 2020-08-28 DIAGNOSIS — R1310 Dysphagia, unspecified: Secondary | ICD-10-CM | POA: Diagnosis not present

## 2020-08-28 DIAGNOSIS — I5042 Chronic combined systolic (congestive) and diastolic (congestive) heart failure: Secondary | ICD-10-CM | POA: Diagnosis not present

## 2020-08-28 LAB — CBC
HCT: 33.8 % — ABNORMAL LOW (ref 36.0–46.0)
Hemoglobin: 10.4 g/dL — ABNORMAL LOW (ref 12.0–15.0)
MCH: 30 pg (ref 26.0–34.0)
MCHC: 30.8 g/dL (ref 30.0–36.0)
MCV: 97.4 fL (ref 80.0–100.0)
Platelets: 274 10*3/uL (ref 150–400)
RBC: 3.47 MIL/uL — ABNORMAL LOW (ref 3.87–5.11)
RDW: 13.3 % (ref 11.5–15.5)
WBC: 7.3 10*3/uL (ref 4.0–10.5)
nRBC: 0 % (ref 0.0–0.2)

## 2020-08-28 LAB — BASIC METABOLIC PANEL
Anion gap: 7 (ref 5–15)
BUN: 12 mg/dL (ref 6–20)
CO2: 26 mmol/L (ref 22–32)
Calcium: 8.1 mg/dL — ABNORMAL LOW (ref 8.9–10.3)
Chloride: 112 mmol/L — ABNORMAL HIGH (ref 98–111)
Creatinine, Ser: 1.29 mg/dL — ABNORMAL HIGH (ref 0.44–1.00)
GFR, Estimated: 49 mL/min — ABNORMAL LOW (ref >60)
Glucose, Bld: 221 mg/dL — ABNORMAL HIGH (ref 70–99)
Potassium: 3.5 mmol/L (ref 3.5–5.1)
Sodium: 145 mmol/L (ref 135–145)

## 2020-08-28 LAB — GLUCOSE, CAPILLARY
Glucose-Capillary: 210 mg/dL — ABNORMAL HIGH (ref 70–99)
Glucose-Capillary: 219 mg/dL — ABNORMAL HIGH (ref 70–99)

## 2020-08-28 MED ORDER — COLLAGENASE 250 UNIT/GM EX OINT
TOPICAL_OINTMENT | Freq: Every day | CUTANEOUS | 0 refills | Status: DC
Start: 2020-08-28 — End: 2020-10-09

## 2020-08-28 MED ORDER — LEVETIRACETAM 750 MG PO TABS
750.0000 mg | ORAL_TABLET | Freq: Two times a day (BID) | ORAL | 0 refills | Status: DC
Start: 2020-08-28 — End: 2021-01-30

## 2020-08-28 MED ORDER — CLOTRIMAZOLE 1 % VA CREA: 1.0000 | TOPICAL_CREAM | Freq: Every day | VAGINAL | 0 refills | Status: AC

## 2020-08-28 NOTE — Progress Notes (Signed)
[name], [name] she was discharged to [place]. Report given to nurse at the facility.

## 2020-08-28 NOTE — Discharge Summary (Signed)
Physician Discharge Summary  [name] QQI:[id number] DOB: Dec 08, 1965 DOA: 08/13/2020  PCP: [name], MD  Admit date: 08/13/2020 Discharge date: 08/28/2020  Admitted From: Home Disposition: SNF  Recommendations for Outpatient Follow-up:  1. Follow up with SNF provider at earliest convenience with repeat CBC/BMP in the next few days 2. Outpatient follow-up with neurology 3. Patient needs to be compliant with medications and follow-up. 4. She needs to abstain from illicit drug use 5. Follow up in ED if symptoms worsen or new appear   Home Health: No Equipment/Devices: None  Discharge Condition: Guarded CODE STATUS: Full Diet recommendation: Heart healthy/carb modified Diet as per SLP recommendations: Dysphagia 1 (puree);Thin liquid Liquids provided via: Cup;Straw Medication Administration: Crushed with puree Supervision: Staff to assist with self feeding Compensations: Slow rate;Small sips/bites;Minimize environmental distractions Postural Changes and/or Swallow Maneuvers: Seated upright 90 degrees   Brief/Interim Summary: 54 year old African-American female with past medical history significant for essential hypertension, chronic anxiety/depression, asthma, hyperlipidemia, type 2 diabetes, polyneuropathy, paroxysmal A. fib on Eliquis, chronic combined diastolic and systolic CHF, prior CVA, CKD stage III A, polysubstance abuse including cocaine who presented to [name] ED after she was found unresponsive by her boyfriend. She was last seen well earlier in the evening of 08/13/2020. EMS was called and found her with seizure activity.  In the ED she was intubated to protect her airway. She was seen by teleneurology who recommended to continue Keppra twice daily and to obtain routine EEG as well of MRI brain with and without contrast. CT head without contrast showed no acute findings. PCCM was called and accepted the patient to their service.  Due to no beds  availability at [facility], the patient was transferred to [facility] long for further management.   UDS + cocaine 08/13/20.  Extubated on 08/20/20.  NG tube placed due to concern for dysphagia.  Patient became very agitated on 08/22/20 removed her NG tube.  She was placed on Precedex briefly due to severe agitation then the Precedex was turned off in less than 24 hours.  She failed her swallow evaluation by speech therapist on 08/23/20.  MBS was completed on 08/23/2020, revealing high risk for aspiration with recommendation for n.p.o. from speech therapist.  Attempted Panda tube placement at bedside but unsuccessful, patient declined 3rd Trial.  Reassessed by speech therapist on 08/25/20 who noted improvement in her dysphagia.  On 08/26/20 seen by speech therapist who recommended dysphagia 1 diet.  Tolerating well.  Her meds have been switched to oral.  PT recommended SNF placement.  She will be discharged to SNF once bed is available.   Discharge Diagnoses:   Resolved toxic metabolic encephalopathy likely multifactorial secondary to new onset seizure, hypoglycemia, acute right basal ganglia stroke, cocaine abuse Briefly required Precedex on 08/22/2020 due to severe agitation, off Precedex within 24-hour of starting the infusion. Switched IV Keppra to p.o. Keppra 750 mg twice daily as the patient is tolerating oral intake well.   Outpatient follow-up with neurology.  Continue statin and Eliquis  Improving severe dysphagia Reassessed by speech therapist on 08/25/20 who noted improvement in her dysphagia.  On 08/26/20 speech therapist recommended dysphagia 1 diet, thin liquid. Continue aspiration precautions. -Outpatient follow-up  Resolved hypoglycemia, D5W discontinued on 08/26/2020  Type 2 diabetes, with resolved recent severe hypoglycemia requiring D10 and D5W Hemoglobin A1c 13.8 on 08/14/2020. Continue Tradjenta Carb modified diet.  Outpatient follow-up.  New onset seizure in the  setting of acute right basal ganglia stroke Seen by neurology  Continue AEDs, p.o. Keppra. Continue seizure precautions Will need to follow-up with neurology outpatient  Acute right basal ganglia stroke PT OT recommending SNF Diet as per SLP recommendations Continue statin and Eliquis.  Outpatient follow-up with neurology.  Paroxysmal A. Fib Currently rate controlled On Eliquis prior to admission Continue Eliquis on discharge.  Essential hypertension BP still on the higher side.  Continue current antihypertensive regimen; Lasix still on hold.  Outpatient follow-up.  Polysubstance abuse UDS positive for cocaine 08/13/2020 Tobacco use disorder She will need to abstain from illicit drug abuse.  Physical debility PT OT recommended SNF Fall precautions  Unstageable sacral pressure ulcer: Present on admission Continue wound care as per wound care recommendations.  Outpatient follow-up    Discharge Instructions  Discharge Instructions    Ambulatory referral to Neurology   Complete by: As directed    An appointment is requested in approximately: 1-2 weeks for hospital followup of seizures   Diet - low sodium heart healthy   Complete by: As directed    Diet Carb Modified   Complete by: As directed    Discharge wound care:   Complete by: As directed    Apply 1/4" thick layer of Santyl to the sacral wound, top with small gauze moist with saline, top with ABD pad, secure with tape. Change daily.   Increase activity slowly   Complete by: As directed      Allergies as of 08/28/2020   No Known Allergies     Medication List    STOP taking these medications   furosemide 20 MG tablet Commonly known as: LASIX   hydrOXYzine 25 MG tablet Commonly known as: ATARAX/VISTARIL   potassium chloride SA 20 MEQ tablet Commonly known as: KLOR-CON     TAKE these medications   albuterol 108 (90 Base) MCG/ACT inhaler Commonly known as: ProAir HFA INHALE 2 PUFFS EVERY 6 HOURS AS  NEEDED FOR SHORTNESS OF BREATH/WHEEZING. What changed:   how much to take  how to take this  when to take this  reasons to take this  additional instructions   amLODipine 10 MG tablet Commonly known as: NORVASC TAKE 1 TABLET BY MOUTH ONCE A DAY.   cloNIDine 0.3 MG tablet Commonly known as: CATAPRES TAKE 1 TABLET BY MOUTH AT BEDTIME FOR BLOOD PRESSURE What changed: See the new instructions.   clopidogrel 75 MG tablet Commonly known as: PLAVIX TAKE 1 TABLET BY MOUTH DAILY WITH BREAKFAST. What changed:   when to take this  additional instructions   clotrimazole 1 % vaginal cream Commonly known as: GYNE-LOTRIMIN Place 1 Applicatorful vaginally at bedtime for 4 days.   collagenase ointment Commonly known as: SANTYL Apply topically daily. Apply to sacral wound daily   DULoxetine 30 MG capsule Commonly known as: CYMBALTA TAKE 2 CAPSULES BY MOUTH ONCE DAILY.   Eliquis 5 MG Tabs tablet Generic drug: apixaban Take 5 mg by mouth 2 (two) times daily.   ezetimibe 10 MG tablet Commonly known as: ZETIA TAKE 1 TABLET BY MOUTH ONCE A DAY.   gabapentin 300 MG capsule Commonly known as: NEURONTIN TAKE 1 CAPSULE BY MOUTH THREE TIMES A DAY.   levETIRAcetam 750 MG tablet Commonly known as: KEPPRA Take 1 tablet (750 mg total) by mouth 2 (two) times daily.   metoprolol succinate 50 MG 24 hr tablet Commonly known as: TOPROL-XL TAKE 1 TABLET BY MOUTH TWICE A DAY. What changed: when to take this   montelukast 10 MG tablet Commonly known as: SINGULAIR TAKE (1) TABLET  BY MOUTH AT BEDTIME. What changed: See the new instructions.   olmesartan 40 MG tablet Commonly known as: BENICAR TAKE 1 TABLET BY MOUTH ONCE A DAY.   pantoprazole 40 MG tablet Commonly known as: PROTONIX TAKE 1 TABLET BY MOUTH ONCE A DAY.   rosuvastatin 10 MG tablet Commonly known as: CRESTOR TAKE 1 TABLET BY MOUTH ONCE A DAY.   solifenacin 5 MG tablet Commonly known as: VESICARE TAKE 1 TABLET BY  MOUTH ONCE DAILY.   Spiriva HandiHaler 18 MCG inhalation capsule Generic drug: tiotropium Place 18 mcg into inhaler and inhale daily as needed (Shortness of breath).   Tradjenta 5 MG Tabs tablet Generic drug: linagliptin TAKE 1 TABLET BY MOUTH ONCE DAILY. What changed: how much to take   traZODone 50 MG tablet Commonly known as: DESYREL TAKE (1) TABLET BY MOUTH AT BEDTIME. What changed: See the new instructions.            Discharge Care Instructions  (From admission, onward)         Start     Ordered   08/28/20 0000  Discharge wound care:       Comments: Apply 1/4" thick layer of Santyl to the sacral wound, top with small gauze moist with saline, top with ABD pad, secure with tape. Change daily.   08/28/20 0931          Contact information for follow-up providers    [name], MD. Schedule an appointment as soon as possible for a visit in 1 week(s).   Specialty: Family Medicine Contact information: [address], [place] [address] [phone]        Herminio Commons, MD .   Specialty: Cardiology Contact information: [place] [address] [phone]            Contact information for after-discharge care    [facility] .   Service: Skilled Nursing Contact information: [address] [place] [phone]                 No Known Allergies  Consultations: Neurology/PCCM  Procedures/Studies: EEG  Result Date: 08/19/2020 Lora Havens, MD     08/19/2020  4:33 PM Patient Name: [name] MRN: [id number] Epilepsy Attending: Lora Havens Referring Physician/Provider: Etta Quill, PA Date: 08/19/2020 Duration: 27.01 mins Patient history:  54 year old female with history of epilepsy who had a breakthrough seizure, in the setting of DKA with blood glucose in the 700 range.  EEG to evaluate for seizures. Level of  alertness: Comatose AEDs during EEG study: Gabapentin, Keppra Technical aspects: This EEG study was done with scalp electrodes positioned according to the 10-20 International system of electrode placement. Electrical activity was acquired at a sampling rate of 500Hz  and reviewed with a high frequency filter of 70Hz  and a low frequency filter of 1Hz . EEG data were recorded continuously and digitally stored. Description: EEG showed continuous generalized 3 to 6 Hz theta-delta slowing.  EEG was reactive to noxious stimulation. Hyperventilation and photic stimulation were not performed.   ABNORMALITY -Continuous slow, generalized IMPRESSION: This study is suggestive of severe diffuse encephalopathy, nonspecific etiology.  No seizures or epileptiform discharges were seen throughout the recording. Lora Havens   DG Abd 1 View  Result Date: 08/21/2020 CLINICAL DATA:  NG tube placement EXAM: ABDOMEN - 1 VIEW COMPARISON:  None. FINDINGS: Feeding tube extends the stomach. Tip appears in the  first portion the duodenum. IMPRESSION: NG tube in stomach. Feeding tube with tip in the first portion the duodenum. Electronically Signed   By: Suzy Bouchard M.D.   On: 08/21/2020 11:28   CT Head Wo Contrast  Result Date: 08/13/2020 CLINICAL DATA:  54 year old female found down, unresponsive. Reportedly had seizure activity, apnea. EXAM: CT HEAD WITHOUT CONTRAST TECHNIQUE: Contiguous axial images were obtained from the base of the skull through the vertex without intravenous contrast. COMPARISON:  Brain MRI 06/24/2017.  Head CT 06/10/2018. FINDINGS: Brain: Stable cerebral volume. No midline shift, ventriculomegaly, mass effect, evidence of mass lesion, intracranial hemorrhage or evidence of cortically based acute infarction. Chronic lacunar infarct of the left thalamus. Stable patchy bilateral white matter hypodensity. No cortical encephalomalacia identified. Chronic cerebellar infarcts better demonstrated by MRI.  Vascular: Calcified atherosclerosis at the skull base. No suspicious intracranial vascular hyperdensity. Skull: Chronic left lamina papyracea fracture. No acute osseous abnormality identified. Sinuses/Orbits: Visualized paranasal sinuses and mastoids are stable and well pneumatized. Other: No acute orbit or scalp soft tissue finding. Fluid in the visible pharynx related to intubation. IMPRESSION: 1. No acute intracranial abnormality identified. 2. Stable non contrast CT appearance of chronic small vessel disease since 2019. Electronically Signed   By: Genevie Ann M.D.   On: 08/13/2020 21:29   MR BRAIN W WO CONTRAST  Result Date: 08/15/2020 CLINICAL DATA:  Delirium. Reportedly had seizure activity. History of polysubstance abuse, diabetes, hypertension. EXAM: MRI HEAD WITHOUT AND WITH CONTRAST TECHNIQUE: Multiplanar, multiecho pulse sequences of the brain and surrounding structures were obtained without and with intravenous contrast. CONTRAST:  57mL GADAVIST GADOBUTROL 1 MMOL/ML IV SOLN COMPARISON:  MRI 06/24/2017, CT head 08/13/2020 FINDINGS: Brain: Small focus of apparent restricted diffusion in the right basal ganglia (series 5, image 77), compatible with small acute/early subacute infarct. No hydrocephalus. No acute hemorrhage. No mass lesion or abnormal mass effect. No abnormal enhancement. There are numerous punctate areas of susceptibility artifact throughout bilateral basal ganglia and thalami, compatible with prior hemorrhage. Larger focus of susceptibility artifact in the right frontal lobe, also compatible with prior hemorrhage. Scattered T2/FLAIR hyperintensities within the white matter, age advanced in number. Small bilateral remote cerebellar loop canal or infarcts. Vascular: Major proximal arterial flow voids are maintained at the skull base. Skull and upper cervical spine: Normal marrow signal. Degenerative changes of the imaged upper cervical spine with likely moderate canal stenosis at multiple  levels. Sinuses/Orbits: Moderate paranasal sinus mucosal thickening throughout the sinuses. Other: Small bilateral mastoid effusions. IMPRESSION: 1. Small acute/early subacute infarct within the right basal ganglia 2. Scattered T2/FLAIR hyperintensities within the white matter, most likely secondary to age advanced chronic microvascular ischemic disease given clinical risk factors. 3. Numerous bilateral prior basal ganglia and thalamic microhemorrhages with additional larger area of prior hemorrhage in the right frontal lobe. While nonspecific, the distribution suggests prior hypertensive hemorrhages. 4. Small remote bilateral cerebellar lacunar infarcts. Electronically Signed   By: Margaretha Sheffield MD   On: 08/15/2020 12:26   DG Chest Port 1 View  Result Date: 08/19/2020 CLINICAL DATA:  Intubation EXAM: PORTABLE CHEST 1 VIEW COMPARISON:  Six days ago FINDINGS: Endotracheal tube with tip 18 mm above the carina. The enteric tube reaches the stomach. Cardiomegaly and vascular pedicle widening. Low volume chest with mild streaky density. No Kerley lines, effusion, or pneumothorax. IMPRESSION: Stable hardware positioning, cardiomegaly, and atelectasis. Electronically Signed   By: Monte Fantasia M.D.   On: 08/19/2020 05:53   DG Chest Portable 1 View  Result Date: 08/13/2020 CLINICAL DATA:  Altered mental status following intubation EXAM: PORTABLE CHEST 1 VIEW COMPARISON:  06/09/2018 FINDINGS: Cardiac shadow is mildly enlarged but stable. Endotracheal tube is noted within the right mainstem bronchus and should be withdrawn 3-4 cm. Gastric catheter extends into the stomach although the tip is not visualized on this film. Lungs are clear bilaterally. No bony abnormality is seen. IMPRESSION: Endotracheal tube as described. This should be withdrawn 3-4 cm. Gastric catheter extends into the stomach. No other focal abnormality is noted. Critical Value/emergent results were called by telephone at the time of  interpretation on 08/13/2020 at 9:14 pm to Dr. Nanda [name] , who verbally acknowledged these results. Electronically Signed   By: Inez Catalina M.D.   On: 08/13/2020 21:22   DG Chest Port 1V same Day  Result Date: 08/13/2020 CLINICAL DATA:  Endotracheal tube and enteric tube placements. EXAM: PORTABLE CHEST 1 VIEW COMPARISON:  08/13/2020 FINDINGS: Endotracheal tube with tip measuring 1.9 cm above the carina. Enteric tube tip is in the right upper quadrant consistent with location in the distal stomach. Shallow inspiration. Cardiac enlargement. Atelectasis or infiltration in the left lung base behind the heart. No pleural effusions. No pneumothorax. Mediastinal contours appear intact. IMPRESSION: Appliances appear in satisfactory location. Cardiac enlargement. Shallow inspiration with atelectasis or infiltration in the left lung base behind the heart. Electronically Signed   By: Lucienne Capers M.D.   On: 08/13/2020 22:14   DG Swallowing Func-Speech Pathology  Result Date: 08/26/2020 Objective Swallowing Evaluation: Type of Study: MBS-Modified Barium Swallow Study  Patient Details Name: BRIANNE MAINA MRN: [id number] Date of Birth: 09-15-1966 Today's Date: 08/26/2020 Time: SLP Start Time (ACUTE ONLY): 1310 -SLP Stop Time (ACUTE ONLY): 1330 SLP Time Calculation (min) (ACUTE ONLY): 20 min Past Medical History: Past Medical History: Diagnosis Date . Alcohol use  . Ankle fracture, lateral malleolus, closed 12/30/2011 . Anxiety  . Breast mass, left 12/15/2011 . Chronic anemia  . Chronic combined systolic and diastolic CHF (congestive heart failure) ([place])  . CKD (chronic kidney disease), stage III ([place])   pt denies . Cocaine abuse ([place])  . COPD (chronic obstructive pulmonary disease) ([place])  . Diabetes mellitus, type 2 ([place])  . Diabetic Charcot foot ([place]) 12/30/2011 . Essential hypertension  . History of GI bleed 12/05/2009  Qualifier: Diagnosis of  By: Zeb Comfort   . Hyperlipidemia  . Hypertensive cardiomyopathy  ([place]) 11/08/2012  a. 05/2016: echo showing EF of 20-25% with normal cors by cath in 07/2016. . Morbid obesity ([place]) 01/31/2008  Qualifier: Diagnosis of  By: Lenn Cal   . Noncompliance with medication regimen  . Obesity  . Panic attacks  . PAT (paroxysmal atrial tachycardia) ([place])   a. started on amiodarone 07/2017 for this. . Sleep apnea   does not use cpap . Stroke ([place])   06/24/17 . Tobacco abuse  . Urinary incontinence  Past Surgical History: Past Surgical History: Procedure Laterality Date . BREAST BIOPSY   . CARDIAC CATHETERIZATION N/A 07/28/2016  Procedure: Left Heart Cath and Coronary Angiography;  Surgeon: Jettie Booze, MD;  Location: [facility];  Service: Cardiovascular;  Laterality: N/A; . COLONOSCOPY N/A 05/10/2019  Procedure: COLONOSCOPY;  Surgeon: Danie Binder, MD;  [facility];  Service: Endoscopy;  Laterality: N/A;  Phenergan 12.5 mg IV in pre-op . DILATION AND CURETTAGE OF UTERUS   . I & D EXTREMITY Bilateral 09/22/2017  Procedure: BILATERAL DEBRIDEMENT LEG/FOOT ULCERS, APPLY VERAFLO WOUND VAC;  Surgeon: Meridee Score  V, MD;  Location: [facility];  Service: Orthopedics;  Laterality: Bilateral; . I & D EXTREMITY Right 10/11/2018  Procedure: IRRIGATION AND DEBRIDEMENT RIGHT HAND;  Surgeon: [name], MD;  Location: [facility];  Service: Orthopedics;  Laterality: Right; . I & D EXTREMITY Right 10/13/2018  Procedure: REPEAT IRRIGATION AND DEBRIDEMENT RIGHT HAND;  Surgeon: [name], MD;  Location: [facility];  Service: Orthopedics;  Laterality: Right; . I & D EXTREMITY Right 11/22/2018  Procedure: IRRIGATION AND DEBRIDEMENT AND PINNING RIGHT HAND;  Surgeon: [name], MD;  Location: [facility];  Service: Orthopedics;  Laterality: Right; . IR RADIOLOGIST EVAL & MGMT  07/05/2018 . POLYPECTOMY  05/10/2019  Procedure: POLYPECTOMY;  Surgeon: Danie Binder, MD;  [facility];  Service: Endoscopy;; . SKIN SPLIT GRAFT Bilateral 09/28/2017  Procedure: BILATERAL SPLIT THICKNESS  SKIN GRAFT LEGS/FEET AND APPLY VAC;  Surgeon: Newt Minion, MD;  Location: [facility];  Service: Orthopedics;  Laterality: Bilateral; . SKIN SPLIT GRAFT Right 11/22/2018  Procedure: SKIN GRAFT SPLIT THICKNESS;  Surgeon: [name], MD;  Location: [facility];  Service: Orthopedics;  Laterality: Right; HPI: 54yo female admitted 08/14/20 with seizures, found unresponsive. PMH: alcohol/cocaine use, anxiety, anemia, CHF, CKD, COPD, DM, essential HTN, GIB, HLD, cardiomyopathy, obesity, PAT (paroxysmal atrial tachycardia), OSA, CVA (2018).  Intubated 10/19-26/21. MRI = subtle acute/early subacute infarct Right basal ganglia. Numerous bilateral prior basal ganglia and thalamic microhemorrhages with additional larger area of prior hemorrhage. Small remote bilateral cerebellar lacunar infarcts.  Subjective: pleasant, alert Assessment / Plan / Recommendation CHL IP CLINICAL IMPRESSIONS 08/26/2020 Clinical Impression Patient presents with improved swallow function as compared to previous MBS on 10/29. She did exhibit oral control delays with all tested boluses leading to premature spillage, but with full clearance of oral residuals. Swallow initiation at level of vallecular sinus with puree solids and nectar thick liquids. Puree solids transited orally and pharyngeally without difficulty and without residuals. Patient exhibited delay of swallow intiaition with thin liquids at level of vallecular sinus and trace penetration during the swallow. Penetrate would transit out of laryngeal vestibule but would not fully transit and this continued during exam. Aspiration did not occur at any time during this swallow study and amount of penetrate remained trace throughout. Thin liquids resulted in trace amount of vallecular, pyriform and posterior pharyngeal wall residuals. Patient is safe to initiate PO diet of Dys 1, thin liquids. She reported she is unable to masticate solids without dentures, which are not available here at the hospital.  SLP Visit Diagnosis Dysphagia, oropharyngeal phase (R13.12) Attention and concentration deficit following -- Frontal lobe and executive function deficit following -- Impact on safety and function Mild aspiration risk   CHL IP TREATMENT RECOMMENDATION 08/26/2020 Treatment Recommendations Therapy as outlined in treatment plan below   Prognosis 08/26/2020 Prognosis for Safe Diet Advancement Good Barriers to Reach Goals -- Barriers/Prognosis Comment -- CHL IP DIET RECOMMENDATION 08/26/2020 SLP Diet Recommendations Thin liquid;Dysphagia 1 (Puree) solids Liquid Administration via Cup;Straw Medication Administration Whole meds with puree Compensations Slow rate;Small sips/bites;Minimize environmental distractions Postural Changes --   CHL IP OTHER RECOMMENDATIONS 08/26/2020 Recommended Consults -- Oral Care Recommendations Oral care QID Other Recommendations Have oral suction available   CHL IP FOLLOW UP RECOMMENDATIONS 08/26/2020 Follow up Recommendations 24 hour supervision/assistance;Skilled Nursing facility;Inpatient Rehab   CHL IP FREQUENCY AND DURATION 08/23/2020 Speech Therapy Frequency (ACUTE ONLY) min 2x/week Treatment Duration 1 week;2 weeks      CHL IP ORAL PHASE 08/26/2020 Oral Phase Impaired Oral -  Pudding Teaspoon -- Oral - Pudding Cup -- Oral - Honey Teaspoon -- Oral - Honey Cup -- Oral - Nectar Teaspoon -- Oral - Nectar Cup Reduced posterior propulsion;Weak lingual manipulation;Delayed oral transit;Premature spillage Oral - Nectar Straw -- Oral - Thin Teaspoon -- Oral - Thin Cup Weak lingual manipulation;Reduced posterior propulsion;Delayed oral transit;Premature spillage Oral - Thin Straw Delayed oral transit;Weak lingual manipulation;Reduced posterior propulsion;Premature spillage Oral - Puree Premature spillage Oral - Mech Soft -- Oral - Regular -- Oral - Multi-Consistency -- Oral - Pill Decreased bolus cohesion;Weak lingual manipulation;Reduced posterior propulsion Oral Phase - Comment --  CHL IP PHARYNGEAL  PHASE 08/26/2020 Pharyngeal Phase Impaired Pharyngeal- Pudding Teaspoon -- Pharyngeal -- Pharyngeal- Pudding Cup -- Pharyngeal -- Pharyngeal- Honey Teaspoon -- Pharyngeal -- Pharyngeal- Honey Cup -- Pharyngeal -- Pharyngeal- Nectar Teaspoon -- Pharyngeal -- Pharyngeal- Nectar Cup Delayed swallow initiation-vallecula;Pharyngeal residue - valleculae Pharyngeal -- Pharyngeal- Nectar Straw -- Pharyngeal -- Pharyngeal- Thin Teaspoon -- Pharyngeal -- Pharyngeal- Thin Cup Delayed swallow initiation-pyriform sinuses;Reduced epiglottic inversion;Penetration/Aspiration during swallow;Pharyngeal residue - valleculae;Pharyngeal residue - pyriform Pharyngeal Material enters airway, remains ABOVE vocal cords and not ejected out Pharyngeal- Thin Straw Delayed swallow initiation-pyriform sinuses;Penetration/Aspiration during swallow;Pharyngeal residue - pyriform;Pharyngeal residue - posterior pharnyx Pharyngeal Material enters airway, remains ABOVE vocal cords and not ejected out Pharyngeal- Puree Delayed swallow initiation-vallecula Pharyngeal -- Pharyngeal- Mechanical Soft -- Pharyngeal -- Pharyngeal- Regular -- Pharyngeal -- Pharyngeal- Multi-consistency -- Pharyngeal -- Pharyngeal- Pill Delayed swallow initiation-vallecula Pharyngeal -- Pharyngeal Comment --  CHL IP CERVICAL ESOPHAGEAL PHASE 08/26/2020 Cervical Esophageal Phase WFL Pudding Teaspoon -- Pudding Cup -- Honey Teaspoon -- Honey Cup -- Nectar Teaspoon -- Nectar Cup -- Nectar Straw -- Thin Teaspoon -- Thin Cup -- Thin Straw -- Puree -- Mechanical Soft -- Regular -- Multi-consistency -- Pill -- Cervical Esophageal Comment -- Sonia Baller, MA, CCC-SLP Speech Therapy             DG Swallowing Func-Speech Pathology  Result Date: 08/23/2020 Objective Swallowing Evaluation: Type of Study: MBS-Modified Barium Swallow Study  Patient Details Name: [name] MRN: [id number] Date of Birth: 03-14-1966 Today's Date: 08/23/2020 Time: SLP Start Time (ACUTE ONLY): 1315  -SLP Stop Time (ACUTE ONLY): 1345 SLP Time Calculation (min) (ACUTE ONLY): 30 min Past Medical History: Past Medical History: Diagnosis Date . Alcohol use  . Ankle fracture, lateral malleolus, closed 12/30/2011 . Anxiety  . Breast mass, left 12/15/2011 . Chronic anemia  . Chronic combined systolic and diastolic CHF (congestive heart failure) ([place])  . CKD (chronic kidney disease), stage III ([place])   pt denies . Cocaine abuse ([place])  . COPD (chronic obstructive pulmonary disease) ([place])  . Diabetes mellitus, type 2 ([place])  . Diabetic Charcot foot ([place]) 12/30/2011 . Essential hypertension  . History of GI bleed 12/05/2009  Qualifier: Diagnosis of  By: Zeb Comfort   . Hyperlipidemia  . Hypertensive cardiomyopathy ([place]) 11/08/2012  a. 05/2016: echo showing EF of 20-25% with normal cors by cath in 07/2016. . Morbid obesity ([place]) 01/31/2008  Qualifier: Diagnosis of  By: Lenn Cal   . Noncompliance with medication regimen  . Obesity  . Panic attacks  . PAT (paroxysmal atrial tachycardia) ([place])   a. started on amiodarone 07/2017 for this. . Sleep apnea   does not use cpap . Stroke ([place])   06/24/17 . Tobacco abuse  . Urinary incontinence  Past Surgical History: Past Surgical History: Procedure Laterality Date . BREAST BIOPSY   . CARDIAC CATHETERIZATION N/A 07/28/2016  Procedure: Left Heart Cath  and Coronary Angiography;  Surgeon: Jettie Booze, MD;  Location: [facility];  Service: Cardiovascular;  Laterality: N/A; . COLONOSCOPY N/A 05/10/2019  Procedure: COLONOSCOPY;  Surgeon: Danie Binder, MD;  [facility];  Service: Endoscopy;  Laterality: N/A;  Phenergan 12.5 mg IV in pre-op . DILATION AND CURETTAGE OF UTERUS   . I & D EXTREMITY Bilateral 09/22/2017  Procedure: BILATERAL DEBRIDEMENT LEG/FOOT ULCERS, APPLY VERAFLO WOUND VAC;  Surgeon: Newt Minion, MD;  Location: [facility];  Service: Orthopedics;  Laterality: Bilateral; . I & D EXTREMITY Right 10/11/2018  Procedure: IRRIGATION AND DEBRIDEMENT RIGHT HAND;   Surgeon: [name], MD;  Location: [facility];  Service: Orthopedics;  Laterality: Right; . I & D EXTREMITY Right 10/13/2018  Procedure: REPEAT IRRIGATION AND DEBRIDEMENT RIGHT HAND;  Surgeon: [name], MD;  Location: [facility];  Service: Orthopedics;  Laterality: Right; . I & D EXTREMITY Right 11/22/2018  Procedure: IRRIGATION AND DEBRIDEMENT AND PINNING RIGHT HAND;  Surgeon: [name], MD;  Location: [facility];  Service: Orthopedics;  Laterality: Right; . IR RADIOLOGIST EVAL & MGMT  07/05/2018 . POLYPECTOMY  05/10/2019  Procedure: POLYPECTOMY;  Surgeon: Danie Binder, MD;  [facility];  Service: Endoscopy;; . SKIN SPLIT GRAFT Bilateral 09/28/2017  Procedure: BILATERAL SPLIT THICKNESS SKIN GRAFT LEGS/FEET AND APPLY VAC;  Surgeon: Newt Minion, MD;  Location: [facility];  Service: Orthopedics;  Laterality: Bilateral; . SKIN SPLIT GRAFT Right 11/22/2018  Procedure: SKIN GRAFT SPLIT THICKNESS;  Surgeon: [name], MD;  Location: [facility];  Service: Orthopedics;  Laterality: Right; HPI: 54yo female admitted 08/14/20 with seizures, found unresponsive. PMH: alcohol/cocaine use, anxiety, anemia, CHF, CKD, COPD, DM, essential HTN, GIB, HLD, cardiomyopathy, obesity, PAT (paroxysmal atrial tachycardia), OSA, CVA (2018).  Intubated 10/19-26/21. MRI = subtle acute/early subacute infarct Right basal ganglia. Numerous bilateral prior basal ganglia and thalamic microhemorrhages with additional larger area of prior hemorrhage. Small remote bilateral cerebellar lacunar infarcts.  Subjective: Pt seen in radiology for MBS Assessment / Plan / Recommendation CHL IP CLINICAL IMPRESSIONS 08/23/2020 Clinical Impression Pt seen for MBS to objectively assess swallow function and safety, and to identify least restrictive diet. Pt was presented with trials of nectar thick liquid, honey thick liquid, and puree. ORAL: Pt presents with functional oral swallow, for the consistencies tested. No anterior leakage or oral residue on  any texture given. Solid textures were not assessed at this time, given high aspiration risk. PHARYNGEAL: Pt exhibits moderate to severe pharyngeal dysphagia. Swallow reflex was noted to occur at the vallecular sinus across consistencies. Poor epiglottic inversion was seen, which resulted in post swallow residue in the vallecular sinus. This was then observed to spill over into the laryngeal vestibule after the swallow. Wet voice quality was noted after a slight delay, with pt then producing a weak, inefficient throat clear. Volitional cough was delayed, likely after aspiration had occurred. Pt required cues to dry swallow to decrease post-swallow vallecular residue. Pt risk of aspiration of a full meal's worth of puree and even thickened liquid is significantly high. She was unable to demonstrate volitional cough effective enough to clear penetrate. At this time, NPO status continues to be recommended, with consideration of short term nonoral feeding method. SLP will continue to follow for dysphagia therapy, PO trials, and to determine readiness for PO intake and/or repeat instrumental assessment. MD and RN notified of results and recommendations.  SLP Visit Diagnosis Dysphagia, pharyngeal phase (R13.13)     Impact on safety  and function Severe aspiration risk;Risk for inadequate nutrition/hydration   CHL IP TREATMENT RECOMMENDATION 08/23/2020 Treatment Recommendations Therapy as outlined in treatment plan below   Prognosis 08/23/2020 Prognosis for Safe Diet Advancement Fair Barriers to Reach Goals Severity of deficits   CHL IP DIET RECOMMENDATION 08/23/2020 SLP Diet Recommendations NPO   Medication Administration Via alternative means   CHL IP OTHER RECOMMENDATIONS 08/23/2020   Oral Care Recommendations Oral care QID Other Recommendations Have oral suction available   CHL IP FOLLOW UP RECOMMENDATIONS 08/23/2020 Follow up Recommendations 24 hour supervision/assistance   CHL IP FREQUENCY AND DURATION 08/23/2020  Speech Therapy Frequency (ACUTE ONLY) min 2x/week Treatment Duration 1 week;2 weeks      CHL IP ORAL PHASE 08/23/2020 Oral Phase WFL for thickened liquids and puree textures    CHL IP PHARYNGEAL PHASE 08/23/2020 Pharyngeal Phase Impaired  Swallow reflex trigger at vallecular sinus Decreased epiglottic inversion Vallecular residue Penetration after the swallow from vallecular residue Reflexive cough delayed - suspect after aspiration had occurred.   CHL IP CERVICAL ESOPHAGEAL PHASE 08/23/2020 Cervical Esophageal Phase [facility]   Celia B. [name] Ore, [facility], [place] Speech Language Pathologist Office: [phone] Pager: [phone] Shonna Chock 08/23/2020, 2:06 PM              EEG adult  Result Date: 08/14/2020 Lora Havens, MD     08/14/2020  4:05 PM Patient Name: [name] MRN: [id number] Epilepsy Attending: Lora Havens Referring Physician/Provider: Dr. Heath Lark Date: 08/14/2020 Duration: 25.01 minutes Patient history: 54 year old female with history of epilepsy who had a breakthrough seizure, in the setting of DKA with blood glucose in the 700 range.  EEG to evaluate for seizures. Level of alertness: comatose AEDs during EEG study: Keppra, propofol Technical aspects: This EEG study was done with scalp electrodes positioned according to the 10-20 International system of electrode placement. Electrical activity was acquired at a sampling rate of 500Hz  and reviewed with a high frequency filter of 70Hz  and a low frequency filter of 1Hz . EEG data were recorded continuously and digitally stored. Description: EEG showed continuous generalized 3 to 5 Hz theta-delta slowing with frontocentral 13 to 15 Hz beta activity.  Hyperventilation and photic stimulation were not performed.   ABNORMALITY -Continuous slow, generalized IMPRESSION: This study is suggestive of severe diffuse encephalopathy, nonspecific etiology but likely related to sedation. No seizures or epileptiform discharges were seen throughout  the recording. Lora Havens   ECHOCARDIOGRAM COMPLETE  Result Date: 08/14/2020    ECHOCARDIOGRAM REPORT   Patient Name:   [name] Date of Exam: 08/14/2020 Medical Rec #:  [id number]        Height:       61.0 in Accession #:    [id number]       Weight:       182.0 lb Date of Birth:  02-23-1966        BSA:          1.815 m Patient Age:    65 years         BP:           99/73 mmHg Patient Gender: F                HR:           102 bpm. Exam Location:  [name] Procedure: 2D Echo Indications:    Atrial Fibrillation 427.31 / I48.91  History:        Patient has prior history of Echocardiogram examinations,  most                 recent 06/22/2017. Arrythmias:Atrial Fibrillation; Risk                 Factors:Hypertension, Current Smoker, Diabetes and Dyslipidemia.                 Respiratory Failure, Polysubstance Abuse.  Sonographer:    Leavy Cella RDCS (AE) Referring Phys: [phone] Royanne Foots [place]  1. Left ventricular ejection fraction, by estimation, is 50 to 55%. The left ventricle has low normal function. The left ventricle has no regional wall motion abnormalities. There is moderate left ventricular hypertrophy. Left ventricular diastolic parameters are indeterminate.  2. Right ventricular systolic function is normal. The right ventricular size is normal. There is normal pulmonary artery systolic pressure. The estimated right ventricular systolic pressure is 53.6 mmHg.  3. Left atrial size was mild to moderately dilated.  4. The mitral valve is grossly normal. Mild mitral valve regurgitation.  5. The aortic valve is tricuspid. Aortic valve regurgitation is not visualized.  6. The inferior vena cava is normal in size with greater than 50% respiratory variability, suggesting right atrial pressure of 3 mmHg. FINDINGS  Left Ventricle: Left ventricular ejection fraction, by estimation, is 50 to 55%. The left ventricle has low normal function. The left ventricle has no regional wall motion  abnormalities. The left ventricular internal cavity size was normal in size. There is moderate left ventricular hypertrophy. Left ventricular diastolic parameters are indeterminate. Right Ventricle: The right ventricular size is normal. No increase in right ventricular wall thickness. Right ventricular systolic function is normal. There is normal pulmonary artery systolic pressure. The tricuspid regurgitant velocity is 2.20 m/s, and  with an assumed right atrial pressure of 3 mmHg, the estimated right ventricular systolic pressure is 14.4 mmHg. Left Atrium: Left atrial size was mild to moderately dilated. Right Atrium: Right atrial size was normal in size. Pericardium: There is no evidence of pericardial effusion. Mitral Valve: The mitral valve is grossly normal. Mild mitral valve regurgitation. Tricuspid Valve: The tricuspid valve is grossly normal. Tricuspid valve regurgitation is trivial. Aortic Valve: The aortic valve is tricuspid. Aortic valve regurgitation is not visualized. Pulmonic Valve: The pulmonic valve was grossly normal. Pulmonic valve regurgitation is trivial. Aorta: The aortic root is normal in size and structure. Venous: The inferior vena cava is normal in size with greater than 50% respiratory variability, suggesting right atrial pressure of 3 mmHg. IAS/Shunts: No atrial level shunt detected by color flow Doppler.  LEFT VENTRICLE PLAX 2D LVIDd:         4.51 cm  Diastology LVIDs:         3.05 cm  LV e' medial:    3.59 cm/s LV PW:         1.80 cm  LV E/e' medial:  15.4 LV IVS:        1.34 cm  LV e' lateral:   4.24 cm/s LVOT diam:     2.10 cm  LV E/e' lateral: 13.0 LVOT Area:     3.46 cm  RIGHT VENTRICLE TAPSE (M-mode): 1.5 cm LEFT ATRIUM              Index       RIGHT ATRIUM           Index LA diam:        3.80 cm  2.09 cm/m  RA Area:     13.20 cm LA Vol (A2C):  118.0 ml 65.03 ml/m RA Volume:   33.30 ml  18.35 ml/m LA Vol (A4C):   58.1 ml  32.02 ml/m LA Biplane Vol: 83.4 ml  45.96 ml/m    AORTA Ao Root diam: 3.20 cm MITRAL VALVE               TRICUSPID VALVE MV Area (PHT): 2.75 cm    TR Peak grad:   19.4 mmHg MV Decel Time: 276 msec    TR Vmax:        220.00 cm/s MV E velocity: 55.30 cm/s MV A velocity: 49.70 cm/s  SHUNTS MV E/A ratio:  1.11        Systemic Diam: 2.10 cm Rozann Lesches MD Electronically signed by Rozann Lesches MD Signature Date/Time: 08/14/2020/4:15:30 PM    Final    DG FLUORO GUIDED LOC OF NEEDLE/CATH TIP FOR SPINAL INJECT LT  Result Date: 08/16/2020 CLINICAL DATA:  Altered mental status, possible meningitis EXAM: DIAGNOSTIC LUMBAR PUNCTURE UNDER FLUOROSCOPIC GUIDANCE FLUOROSCOPY TIME:  Fluoroscopy Time:  18 seconds Radiation Exposure Index (if provided by the fluoroscopic device): 26.8 mGy PROCEDURE: Informed consent was obtained from the patient prior to the procedure, including potential complications of headache, allergy, and pain. With the patient prone, the lower back was prepped with Betadine. 1% Lidocaine was used for local anesthesia. Lumbar puncture was performed at the L2-L3 level using a 22 gauge needle with return of clear CSF with normal opening pressure. 9 mL of CSF were obtained for laboratory studies. The patient tolerated the procedure well and there were no apparent complications. IMPRESSION: Technically successful fluoroscopic guided lumbar puncture. Electronically Signed   By: Macy Mis M.D.   On: 08/16/2020 08:10       Subjective: Patient seen and examined at bedside.  She is sleepy, wakes up slightly, very slow to respond to questions but states the current year and month appropriately.  No overnight fever, vomiting or seizures reported by nursing staff.  Discharge Exam: Vitals:   08/27/20 2026 08/28/20 0559  BP: 137/86 (!) 144/91  Pulse: 86 89  Resp: 18 20  Temp: 98.6 F (37 C) 97.8 F (36.6 C)  SpO2: 92% 92%    General: Pt is sleepy, wakes up slightly, very slow to respond to questions but states the current year and month  appropriately.  Poor historian. Cardiovascular: rate controlled, S1/S2 + Respiratory: bilateral decreased breath sounds at bases with some scattered crackles Abdominal: Soft, NT, ND, bowel sounds + Extremities: Trace lower extremity edema; no cyanosis    The results of significant diagnostics from this hospitalization (including imaging, microbiology, ancillary and laboratory) are listed below for reference.     Microbiology: Recent Results (from the past 240 hour(s))  SARS Coronavirus 2 by RT PCR (hospital order, performed in [facility] hospital lab) Nasopharyngeal Nasopharyngeal Swab     Status: None   Collection Time: 08/27/20  1:42 PM   Specimen: Nasopharyngeal Swab  Result Value Ref Range Status   SARS Coronavirus 2 NEGATIVE NEGATIVE Final    Comment: (NOTE) SARS-CoV-2 target nucleic acids are NOT DETECTED.  The SARS-CoV-2 RNA is generally detectable in upper and lower respiratory specimens during the acute phase of infection. The lowest concentration of SARS-CoV-2 viral copies this assay can detect is 250 copies / mL. A negative result does not preclude SARS-CoV-2 infection and should not be used as the sole basis for treatment or other patient management decisions.  A negative result may occur with improper specimen collection / handling, submission  of specimen other than nasopharyngeal swab, presence of viral mutation(s) within the areas targeted by this assay, and inadequate number of viral copies (<250 copies / mL). A negative result must be combined with clinical observations, patient history, and epidemiological information.  Fact Sheet for Patients:   StrictlyIdeas.no  Fact Sheet for Healthcare Providers: BankingDealers.co.za  This test is not yet approved or  cleared by the [place] FDA and has been authorized for detection and/or diagnosis of SARS-CoV-2 by FDA under an Emergency Use Authorization (EUA).  This  EUA will remain in effect (meaning this test can be used) for the duration of the COVID-19 declaration under Section 564(b)(1) of the Act, 21 U.S.C. section 360bbb-3(b)(1), unless the authorization is terminated or revoked sooner.  Performed at [facility], [place] [address], [place], [place] [address]      Labs: BNP (last 3 results) No results for input(s): BNP in the last 8760 hours. Basic Metabolic Panel: Recent Labs  Lab 08/24/20 0240 08/25/20 0312 08/26/20 0455 08/27/20 0735 08/28/20 0516  NA 143 143 143 143 145  K 4.5 4.8 4.1 3.6 3.5  CL 109 111 111 110 112*  CO2 22 22 19* 24 26  GLUCOSE 228* 203* 190* 181* 221*  BUN 21* 16 14 11 12   CREATININE 1.31* 1.32* 1.22* 1.20* 1.29*  CALCIUM 8.6* 8.3* 8.5* 8.1* 8.1*   Liver Function Tests: No results for input(s): AST, ALT, ALKPHOS, BILITOT, PROT, ALBUMIN in the last 168 hours. No results for input(s): LIPASE, AMYLASE in the last 168 hours. No results for input(s): AMMONIA in the last 168 hours. CBC: Recent Labs  Lab 08/23/20 0947 08/24/20 0240 08/25/20 0312 08/26/20 0610 08/28/20 0516  WBC 8.1 8.4 7.8 8.1 7.3  HGB 12.5 11.4* 10.5* 10.9* 10.4*  HCT 40.9 36.5 33.8* 34.9* 33.8*  MCV 96.2 93.1 97.4 95.1 97.4  PLT 246 239 236 268 274   Cardiac Enzymes: No results for input(s): CKTOTAL, CKMB, CKMBINDEX, TROPONINI in the last 168 hours. BNP: Invalid input(s): POCBNP CBG: Recent Labs  Lab 08/27/20 0727 08/27/20 1119 08/27/20 1612 08/27/20 2031 08/28/20 0750  GLUCAP 145* 173* 161* 191* 210*   D-Dimer No results for input(s): DDIMER in the last 72 hours. Hgb A1c No results for input(s): HGBA1C in the last 72 hours. Lipid Profile No results for input(s): CHOL, HDL, LDLCALC, TRIG, CHOLHDL, LDLDIRECT in the last 72 hours. Thyroid function studies No results for input(s): TSH, T4TOTAL, T3FREE, THYROIDAB in the last 72 hours.  Invalid input(s): FREET3 Anemia work up No results for input(s):  VITAMINB12, FOLATE, FERRITIN, TIBC, IRON, RETICCTPCT in the last 72 hours. Urinalysis    Component Value Date/Time   COLORURINE STRAW (A) 08/13/2020 2027   APPEARANCEUR CLEAR 08/13/2020 2027   LABSPEC 1.015 08/13/2020 2027   PHURINE 7.0 08/13/2020 2027   GLUCOSEU >=500 (A) 08/13/2020 2027   HGBUR SMALL (A) 08/13/2020 2027   BILIRUBINUR NEGATIVE 08/13/2020 2027   KETONESUR NEGATIVE 08/13/2020 2027   PROTEINUR >300 (A) 08/13/2020 2027   UROBILINOGEN 0.2 09/11/2014 1319   NITRITE NEGATIVE 08/13/2020 2027   LEUKOCYTESUR NEGATIVE 08/13/2020 2027   Sepsis Labs Invalid input(s): PROCALCITONIN,  WBC,  LACTICIDVEN Microbiology Recent Results (from the past 240 hour(s))  SARS Coronavirus 2 by RT PCR (hospital order, performed in [place] hospital lab) Nasopharyngeal Nasopharyngeal Swab     Status: None   Collection Time: 08/27/20  1:42 PM   Specimen: Nasopharyngeal Swab  Result Value Ref Range Status   SARS Coronavirus 2  NEGATIVE NEGATIVE Final    Comment: (NOTE) SARS-CoV-2 target nucleic acids are NOT DETECTED.  The SARS-CoV-2 RNA is generally detectable in upper and lower respiratory specimens during the acute phase of infection. The lowest concentration of SARS-CoV-2 viral copies this assay can detect is 250 copies / mL. A negative result does not preclude SARS-CoV-2 infection and should not be used as the sole basis for treatment or other patient management decisions.  A negative result may occur with improper specimen collection / handling, submission of specimen other than nasopharyngeal swab, presence of viral mutation(s) within the areas targeted by this assay, and inadequate number of viral copies (<250 copies / mL). A negative result must be combined with clinical observations, patient history, and epidemiological information.  Fact Sheet for Patients:   StrictlyIdeas.no  Fact Sheet for Healthcare  Providers: BankingDealers.co.za  This test is not yet approved or  cleared by the [place] FDA and has been authorized for detection and/or diagnosis of SARS-CoV-2 by FDA under an Emergency Use Authorization (EUA).  This EUA will remain in effect (meaning this test can be used) for the duration of the COVID-19 declaration under Section 564(b)(1) of the Act, 21 U.S.C. section 360bbb-3(b)(1), unless the authorization is terminated or revoked sooner.  Performed at [facility], [place] [address], [place], [place] [address]      Time coordinating discharge: 35 minutes  SIGNED:   Aline August, MD  [facility] 08/28/2020, 9:33 AM

## 2020-08-28 NOTE — TOC Transition Note (Signed)
Transition of Care [facility]) - CM/SW Discharge Note   Patient Details  Name: [name] MRN: [id number] Date of Birth: [birth year]  [facility]) CM/SW Contact:  Dessa Phi, RN Phone Number: 08/28/2020, 10:36 AM   Clinical Narrative: d/c today to [facility] aware-d/c summary sent via hub-going to [id number] call report tel#[phone]-PTAR called. No further CM needs.      Final next level of care: Skilled Nursing Facility Barriers to Discharge: No Barriers Identified   Patient Goals and CMS Choice Patient states their goals for this hospitalization and ongoing recovery are:: SNF placement in [place] area, near family. CMS Medicare.gov Compare Post Acute Care list provided to:: Other (Comment Required) Judd Lien, son [phone]) Choice offered to / list presented to : Adult Children  Discharge Placement PASRR number recieved: 08/25/20            Patient chooses bed at:  ([place]) Patient to be transferred to facility by: [place] Name of family member notified: [name](son) [phone] Patient and family notified of of transfer: 08/28/20  Discharge Plan and Services In-house Referral: Clinical Social Work   Post Acute Care Choice: [place]                               Social Determinants of Health (SDOH) Interventions     Readmission Risk Interventions Readmission Risk Prevention Plan 08/27/2020  Transportation Screening Complete  Medication Review Press photographer) Complete  PCP or Specialist appointment within 3-5 days of discharge Complete  HRI or [place] Complete  SW Recovery Care/Counseling Consult Complete  Palliative Care Screening Not [place] Complete  Some recent data might be hidden

## 2020-08-29 ENCOUNTER — Encounter: Payer: Medicare Other | Admitting: Family Medicine

## 2020-08-29 DIAGNOSIS — E119 Type 2 diabetes mellitus without complications: Secondary | ICD-10-CM | POA: Diagnosis not present

## 2020-08-29 DIAGNOSIS — I639 Cerebral infarction, unspecified: Secondary | ICD-10-CM | POA: Diagnosis not present

## 2020-08-29 DIAGNOSIS — I10 Essential (primary) hypertension: Secondary | ICD-10-CM | POA: Diagnosis not present

## 2020-08-29 DIAGNOSIS — I5030 Unspecified diastolic (congestive) heart failure: Secondary | ICD-10-CM | POA: Diagnosis not present

## 2020-08-29 DIAGNOSIS — J449 Chronic obstructive pulmonary disease, unspecified: Secondary | ICD-10-CM | POA: Diagnosis not present

## 2020-09-02 DIAGNOSIS — G9341 Metabolic encephalopathy: Secondary | ICD-10-CM | POA: Diagnosis not present

## 2020-09-03 DIAGNOSIS — I639 Cerebral infarction, unspecified: Secondary | ICD-10-CM | POA: Diagnosis not present

## 2020-09-03 DIAGNOSIS — G9340 Encephalopathy, unspecified: Secondary | ICD-10-CM | POA: Diagnosis not present

## 2020-09-04 DIAGNOSIS — R2689 Other abnormalities of gait and mobility: Secondary | ICD-10-CM | POA: Diagnosis not present

## 2020-09-04 DIAGNOSIS — E11622 Type 2 diabetes mellitus with other skin ulcer: Secondary | ICD-10-CM | POA: Diagnosis not present

## 2020-09-04 DIAGNOSIS — G619 Inflammatory polyneuropathy, unspecified: Secondary | ICD-10-CM | POA: Diagnosis not present

## 2020-09-04 DIAGNOSIS — L89320 Pressure ulcer of left buttock, unstageable: Secondary | ICD-10-CM | POA: Diagnosis not present

## 2020-09-05 LAB — CULTURE, FUNGUS WITHOUT SMEAR

## 2020-09-11 DIAGNOSIS — G619 Inflammatory polyneuropathy, unspecified: Secondary | ICD-10-CM | POA: Diagnosis not present

## 2020-09-11 DIAGNOSIS — L89320 Pressure ulcer of left buttock, unstageable: Secondary | ICD-10-CM | POA: Diagnosis not present

## 2020-09-11 DIAGNOSIS — E11622 Type 2 diabetes mellitus with other skin ulcer: Secondary | ICD-10-CM | POA: Diagnosis not present

## 2020-09-11 DIAGNOSIS — R2689 Other abnormalities of gait and mobility: Secondary | ICD-10-CM | POA: Diagnosis not present

## 2020-09-12 DIAGNOSIS — I509 Heart failure, unspecified: Secondary | ICD-10-CM | POA: Diagnosis not present

## 2020-09-12 DIAGNOSIS — R062 Wheezing: Secondary | ICD-10-CM | POA: Diagnosis not present

## 2020-09-18 DIAGNOSIS — R2689 Other abnormalities of gait and mobility: Secondary | ICD-10-CM | POA: Diagnosis not present

## 2020-09-18 DIAGNOSIS — G619 Inflammatory polyneuropathy, unspecified: Secondary | ICD-10-CM | POA: Diagnosis not present

## 2020-09-18 DIAGNOSIS — L89320 Pressure ulcer of left buttock, unstageable: Secondary | ICD-10-CM | POA: Diagnosis not present

## 2020-09-18 DIAGNOSIS — E11622 Type 2 diabetes mellitus with other skin ulcer: Secondary | ICD-10-CM | POA: Diagnosis not present

## 2020-09-23 DIAGNOSIS — J449 Chronic obstructive pulmonary disease, unspecified: Secondary | ICD-10-CM | POA: Diagnosis not present

## 2020-09-23 DIAGNOSIS — E119 Type 2 diabetes mellitus without complications: Secondary | ICD-10-CM | POA: Diagnosis not present

## 2020-09-23 DIAGNOSIS — I10 Essential (primary) hypertension: Secondary | ICD-10-CM | POA: Diagnosis not present

## 2020-09-23 DIAGNOSIS — E785 Hyperlipidemia, unspecified: Secondary | ICD-10-CM | POA: Diagnosis not present

## 2020-09-23 DIAGNOSIS — I5030 Unspecified diastolic (congestive) heart failure: Secondary | ICD-10-CM | POA: Diagnosis not present

## 2020-09-25 DIAGNOSIS — L89320 Pressure ulcer of left buttock, unstageable: Secondary | ICD-10-CM | POA: Diagnosis not present

## 2020-09-25 DIAGNOSIS — R2689 Other abnormalities of gait and mobility: Secondary | ICD-10-CM | POA: Diagnosis not present

## 2020-09-25 DIAGNOSIS — G619 Inflammatory polyneuropathy, unspecified: Secondary | ICD-10-CM | POA: Diagnosis not present

## 2020-09-25 DIAGNOSIS — E11622 Type 2 diabetes mellitus with other skin ulcer: Secondary | ICD-10-CM | POA: Diagnosis not present

## 2020-09-29 DIAGNOSIS — R2681 Unsteadiness on feet: Secondary | ICD-10-CM | POA: Diagnosis not present

## 2020-09-30 DIAGNOSIS — I10 Essential (primary) hypertension: Secondary | ICD-10-CM | POA: Diagnosis not present

## 2020-09-30 DIAGNOSIS — J449 Chronic obstructive pulmonary disease, unspecified: Secondary | ICD-10-CM | POA: Diagnosis not present

## 2020-09-30 DIAGNOSIS — E119 Type 2 diabetes mellitus without complications: Secondary | ICD-10-CM | POA: Diagnosis not present

## 2020-09-30 DIAGNOSIS — I5030 Unspecified diastolic (congestive) heart failure: Secondary | ICD-10-CM | POA: Diagnosis not present

## 2020-09-30 DIAGNOSIS — E785 Hyperlipidemia, unspecified: Secondary | ICD-10-CM | POA: Diagnosis not present

## 2020-10-01 ENCOUNTER — Other Ambulatory Visit: Payer: Self-pay

## 2020-10-01 ENCOUNTER — Ambulatory Visit (INDEPENDENT_AMBULATORY_CARE_PROVIDER_SITE_OTHER): Payer: Medicare Other | Admitting: Neurology

## 2020-10-01 ENCOUNTER — Encounter: Payer: Self-pay | Admitting: Neurology

## 2020-10-01 VITALS — BP 148/90 | HR 73 | Ht 61.0 in | Wt 174.0 lb

## 2020-10-01 DIAGNOSIS — R569 Unspecified convulsions: Secondary | ICD-10-CM

## 2020-10-01 DIAGNOSIS — I6330 Cerebral infarction due to thrombosis of unspecified cerebral artery: Secondary | ICD-10-CM

## 2020-10-01 DIAGNOSIS — G40909 Epilepsy, unspecified, not intractable, without status epilepticus: Secondary | ICD-10-CM | POA: Insufficient documentation

## 2020-10-01 HISTORY — DX: Unspecified convulsions: R56.9

## 2020-10-01 NOTE — Patient Instructions (Signed)
Continue the Keppra for now, we will consider discontinuance in the future, if she does well over the next 6 months.

## 2020-10-01 NOTE — Progress Notes (Signed)
Reason for visit: Seizures, stroke  Referring physician: [facility] [name] is a 54 y.o. female  History of present illness:  [name] is a 54 year old right-handed black female with a history of diabetes, tobacco abuse, cocaine abuse, chronic renal insufficiency, atrial fibrillation, congestive heart failure, and recent onset of seizure.  The patient was admitted to the hospital on 08/13/2020, the patient claims that she was found at home having a seizure.  The patient was found to have diabetic ketoacidosis with a blood sugar of 772 on admission.  The patient admits to eating pure starch on a regular basis.  She was placed on Keppra in the hospital, at least 2 EEG studies were done, both showing generalized slowing consistent with a metabolic encephalopathy.  The patient underwent lumbar puncture showing 10 white cells with a normal glucose level and a slightly elevated protein level, no evidence of meningitis or herpes encephalitis was identified.  The patient has been noted to have a small subcortical stroke in the right basal ganglia area, she has had some left-sided weakness.  Since the hospitalization she has not been able to walk with some left-sided weakness.  The patient can take a few steps with a walker.  She wants to go home but she does not have any caretakers at home and she would require 24/7 assistance.  The patient initially had significant dysphagia but this is now improving.  The patient claims that her blood sugar remains in the 400 range but then admits to eating snacks that she gets from her roommate in the evening.  The patient does have urinary frequency and some urinary incontinence.  She has occasional headaches, she denies any vision changes.  She has not had any further seizures since coming out of the hospital.  She comes to this office for an evaluation.  Past Medical History:  Diagnosis Date  . Alcohol use   . Ankle fracture, lateral malleolus, closed  12/30/2011  . Anxiety   . Breast mass, left 12/15/2011  . Chronic anemia   . Chronic combined systolic and diastolic CHF (congestive heart failure) ([place])   . CKD (chronic kidney disease), stage III ([place])    pt denies  . Cocaine abuse ([place])   . COPD (chronic obstructive pulmonary disease) ([place])   . Diabetes mellitus, type 2 ([place])   . Diabetic Charcot foot ([place]) 12/30/2011  . Essential hypertension   . History of GI bleed 12/05/2009   Qualifier: Diagnosis of  By: Zeb Comfort    . Hyperlipidemia   . Hypertensive cardiomyopathy ([place]) 11/08/2012   a. 05/2016: echo showing EF of 20-25% with normal cors by cath in 07/2016.  . Morbid obesity ([place]) 01/31/2008   Qualifier: Diagnosis of  By: Lenn Cal    . Noncompliance with medication regimen   . Obesity   . Panic attacks   . PAT (paroxysmal atrial tachycardia) ([place])    a. started on amiodarone 07/2017 for this.  . Sleep apnea    does not use cpap  . Stroke ([place])    06/24/17  . Tobacco abuse   . Urinary incontinence     Past Surgical History:  Procedure Laterality Date  . BREAST BIOPSY    . CARDIAC CATHETERIZATION N/A 07/28/2016   Procedure: Left Heart Cath and Coronary Angiography;  Surgeon: Jettie Booze, MD;  Location: [facility];  Service: Cardiovascular;  Laterality: N/A;  . COLONOSCOPY N/A 05/10/2019   Procedure: COLONOSCOPY;  Surgeon: Oneida Alar,  Marga Melnick, MD;  [facility];  Service: Endoscopy;  Laterality: N/A;  Phenergan 12.5 mg IV in pre-op  . DILATION AND CURETTAGE OF UTERUS    . I & D EXTREMITY Bilateral 09/22/2017   Procedure: BILATERAL DEBRIDEMENT LEG/FOOT ULCERS, APPLY VERAFLO WOUND VAC;  Surgeon: Newt Minion, MD;  Location: [facility];  Service: Orthopedics;  Laterality: Bilateral;  . I & D EXTREMITY Right 10/11/2018   Procedure: IRRIGATION AND DEBRIDEMENT RIGHT HAND;  Surgeon: [name], MD;  Location: [employer];  Service: Orthopedics;  Laterality: Right;  . I & D EXTREMITY Right 10/13/2018    Procedure: REPEAT IRRIGATION AND DEBRIDEMENT RIGHT HAND;  Surgeon: [name], MD;  Location: [facility];  Service: Orthopedics;  Laterality: Right;  . I & D EXTREMITY Right 11/22/2018   Procedure: IRRIGATION AND DEBRIDEMENT AND PINNING RIGHT HAND;  Surgeon: [name], MD;  Location: [facility];  Service: Orthopedics;  Laterality: Right;  . IR RADIOLOGIST EVAL & MGMT  07/05/2018  . POLYPECTOMY  05/10/2019   Procedure: POLYPECTOMY;  Surgeon: Danie Binder, MD;  [facility];  Service: Endoscopy;;  . SKIN SPLIT GRAFT Bilateral 09/28/2017   Procedure: BILATERAL SPLIT THICKNESS SKIN GRAFT LEGS/FEET AND APPLY VAC;  Surgeon: Newt Minion, MD;  Location: [facility];  Service: Orthopedics;  Laterality: Bilateral;  . SKIN SPLIT GRAFT Right 11/22/2018   Procedure: SKIN GRAFT SPLIT THICKNESS;  Surgeon: [name], MD;  Location: [facility];  Service: Orthopedics;  Laterality: Right;    Family History  Problem Relation Age of Onset  . Hypertension Mother   . Heart attack Mother   . Hypertension Father        CABG   . Hypertension Sister   . Hypertension Brother   . Hypertension Sister   . Cancer Sister        breast   . Arthritis Other   . Cancer Other   . Diabetes Other   . Asthma Other   . Hypertension Daughter   . Hypertension Son     Social history:  reports that she has been smoking cigarettes. She has a 20.00 pack-year smoking history. She has never used smokeless tobacco. She reports previous drug use. Frequency: 3.00 times per week. Drugs: Marijuana and Cocaine. She reports that she does not drink alcohol.  Medications:  Prior to Admission medications   Medication Sig Start Date End Date Taking? Authorizing Provider  albuterol (PROAIR HFA) 108 (90 Base) MCG/ACT inhaler INHALE 2 PUFFS EVERY 6 HOURS AS NEEDED FOR SHORTNESS OF BREATH/WHEEZING. Patient taking differently: Inhale 2 puffs into the lungs every 6 (six) hours as needed for wheezing or shortness of breath.  08/05/20  Yes  [name], MD  amLODipine (NORVASC) 10 MG tablet TAKE 1 TABLET BY MOUTH ONCE A DAY. Patient taking differently: Take 10 mg by mouth daily.  07/19/20  Yes [name], MD  apixaban (ELIQUIS) 5 MG TABS tablet Take 5 mg by mouth 2 (two) times daily.    Yes [provider]  cloNIDine (CATAPRES) 0.3 MG tablet TAKE 1 TABLET BY MOUTH AT BEDTIME FOR BLOOD PRESSURE Patient taking differently: Take 0.3 mg by mouth at bedtime.  07/19/20  Yes [name], MD  clopidogrel (PLAVIX) 75 MG tablet TAKE 1 TABLET BY MOUTH DAILY WITH BREAKFAST. Patient taking differently: Take 75 mg by mouth daily. With breakfast 07/19/20  Yes [name], MD  collagenase (SANTYL) ointment Apply topically daily. Apply to sacral wound daily  08/28/20  Yes Aline August, MD  DULoxetine (CYMBALTA) 30 MG capsule TAKE 2 CAPSULES BY MOUTH ONCE DAILY. Patient taking differently: Take 60 mg by mouth daily.  07/19/20  Yes [name], MD  ezetimibe (ZETIA) 10 MG tablet TAKE 1 TABLET BY MOUTH ONCE A DAY. Patient taking differently: Take 10 mg by mouth daily.  07/19/20  Yes [name], MD  gabapentin (NEURONTIN) 300 MG capsule TAKE 1 CAPSULE BY MOUTH THREE TIMES A DAY. 11/14/19  Yes Newt Minion, MD  levETIRAcetam (KEPPRA) 750 MG tablet Take 1 tablet (750 mg total) by mouth 2 (two) times daily. 08/28/20  Yes Aline August, MD  metoprolol succinate (TOPROL-XL) 50 MG 24 hr tablet TAKE 1 TABLET BY MOUTH TWICE A DAY. Patient taking differently: Take 50 mg by mouth in the morning and at bedtime.  08/13/20  Yes [name], MD  montelukast (SINGULAIR) 10 MG tablet TAKE (1) TABLET BY MOUTH AT BEDTIME. Patient taking differently: Take 10 mg by mouth at bedtime.  07/19/20  Yes [name], MD  olmesartan (BENICAR) 40 MG tablet TAKE 1 TABLET BY MOUTH ONCE A DAY. Patient taking differently: Take 40 mg by mouth daily.  08/13/20  Yes [name], MD  oxybutynin (DITROPAN-XL) 5  MG 24 hr tablet Take 5 mg by mouth at bedtime.   Yes [provider]  pantoprazole (PROTONIX) 40 MG tablet TAKE 1 TABLET BY MOUTH ONCE A DAY. Patient taking differently: Take 40 mg by mouth daily.  07/19/20  Yes [name], MD  rosuvastatin (CRESTOR) 10 MG tablet TAKE 1 TABLET BY MOUTH ONCE A DAY. Patient taking differently: Take 10 mg by mouth daily.  07/19/20  Yes [name], MD  sitaGLIPtin (JANUVIA) 100 MG tablet Take 100 mg by mouth daily.   Yes [provider]  solifenacin (VESICARE) 5 MG tablet TAKE 1 TABLET BY MOUTH ONCE DAILY. Patient taking differently: Take 5 mg by mouth daily.  07/19/20  Yes [name], MD  tiotropium (SPIRIVA HANDIHALER) 18 MCG inhalation capsule Place 18 mcg into inhaler and inhale daily as needed (Shortness of breath).    Yes [provider]  TRADJENTA 5 MG TABS tablet TAKE 1 TABLET BY MOUTH ONCE DAILY. Patient taking differently: Take 5 mg by mouth daily.  08/13/20  Yes [name], MD  traZODone (DESYREL) 50 MG tablet TAKE (1) TABLET BY MOUTH AT BEDTIME. Patient taking differently: Take 50 mg by mouth at bedtime.  07/19/20  Yes [name], MD     No Known Allergies  ROS:  Out of a complete 14 system review of symptoms, the patient complains only of the following symptoms, and all other reviewed systems are negative.  Weakness Walking difficulty   Blood pressure (!) 148/90, pulse 73, height 5\' 1"  (1.549 m), weight 174 lb (78.9 kg), last menstrual period 05/19/2016.  Physical Exam  General: The patient is alert and cooperative at the time of the examination.  The patient is moderately obese.  Eyes: Pupils are equal, round, and reactive to light. Discs are flat bilaterally.  Neck: The neck is supple, no carotid bruits are noted.  Respiratory: The respiratory examination is clear.  Cardiovascular: The cardiovascular examination reveals a regular rate and rhythm, no obvious murmurs or  rubs are noted.  Skin: Extremities are without significant edema.  Neurologic Exam  Mental status: The patient is alert and oriented x 3 at the time of the examination. The patient has apparent  normal recent and remote memory, with an apparently normal attention span and concentration ability.  Cranial nerves: Facial symmetry is present. There is good sensation of the face to pinprick and soft touch bilaterally. The strength of the facial muscles and the muscles to head turning and shoulder shrug are normal bilaterally. Speech is slightly dysarthric, not aphasic. Extraocular movements are full. Visual fields are full. The tongue is midline, and the patient has symmetric elevation of the soft palate. No obvious hearing deficits are noted.  Motor: The motor testing reveals 5 over 5 strength of the right extremities.  Slight weakness of the intrinsic muscles of the left hand is noted, good upper arm strength is noted.  The patient has 4/5 strength with hip flexion on the left, good distal strength of the leg.  Good symmetric motor tone is noted throughout.  Sensory: Sensory testing is intact to pinprick, soft touch, vibration sensation, and position sense on all 4 extremities. No evidence of extinction is noted.  Coordination: Cerebellar testing reveals good finger-nose-finger and heel-to-shin bilaterally.  Gait and station: Gait is very limited, the patient requires some assistance with standing, once up, she is unable to stand independently.  She can take a step or 2 with the examiner but is quite unsteady with a wide-based gait.  Reflexes: Deep tendon reflexes are symmetric, but are depressed bilaterally. Toes are downgoing bilaterally.   Assessment/Plan:  1.  Symptomatic seizures in setting of diabetic ketoacidosis  2.  History of cocaine abuse and tobacco abuse  3.  Cerebrovascular disease, recent right brain stroke, left hemiparesis  4.  Gait disturbance  5.  Diabetes with poor  compliance with diet  The patient appears to have a lot of self-destructive behaviors.  She had a seizure in the context of severely elevated blood sugar levels, she may not require long-term anticonvulsant therapy.  The patient will follow up in about 4 months, we will check an EEG study at that time and consider a taper off of the [place].  The patient has a significant gait disorder, she requires 24/7 assistance at this time.  She is on anticoagulant therapy and Plavix.   Jill Alexanders MD 10/01/2020 10:17 AM  [facility] [address] [place], [place] [address]  Phone [phone] Fax [phone]

## 2020-10-04 ENCOUNTER — Inpatient Hospital Stay (HOSPITAL_COMMUNITY): Admit: 2020-10-04 | Payer: Medicare Other

## 2020-10-09 ENCOUNTER — Encounter: Payer: Self-pay | Admitting: Family Medicine

## 2020-10-09 ENCOUNTER — Ambulatory Visit (INDEPENDENT_AMBULATORY_CARE_PROVIDER_SITE_OTHER): Payer: Medicare Other | Admitting: Family Medicine

## 2020-10-09 ENCOUNTER — Other Ambulatory Visit: Payer: Self-pay

## 2020-10-09 VITALS — BP 164/90 | HR 90 | Resp 16 | Ht 61.0 in | Wt 175.0 lb

## 2020-10-09 DIAGNOSIS — Z7409 Other reduced mobility: Secondary | ICD-10-CM | POA: Diagnosis not present

## 2020-10-09 DIAGNOSIS — L98421 Non-pressure chronic ulcer of back limited to breakdown of skin: Secondary | ICD-10-CM | POA: Diagnosis not present

## 2020-10-09 DIAGNOSIS — I6330 Cerebral infarction due to thrombosis of unspecified cerebral artery: Secondary | ICD-10-CM

## 2020-10-09 DIAGNOSIS — I10 Essential (primary) hypertension: Secondary | ICD-10-CM

## 2020-10-09 DIAGNOSIS — E1159 Type 2 diabetes mellitus with other circulatory complications: Secondary | ICD-10-CM

## 2020-10-09 DIAGNOSIS — E1165 Type 2 diabetes mellitus with hyperglycemia: Secondary | ICD-10-CM | POA: Diagnosis not present

## 2020-10-09 DIAGNOSIS — Z1231 Encounter for screening mammogram for malignant neoplasm of breast: Secondary | ICD-10-CM | POA: Diagnosis not present

## 2020-10-09 DIAGNOSIS — Z794 Long term (current) use of insulin: Secondary | ICD-10-CM

## 2020-10-09 DIAGNOSIS — R29898 Other symptoms and signs involving the musculoskeletal system: Secondary | ICD-10-CM

## 2020-10-09 DIAGNOSIS — L89152 Pressure ulcer of sacral region, stage 2: Secondary | ICD-10-CM

## 2020-10-09 MED ORDER — CLONIDINE HCL 0.3 MG PO TABS: 0.3000 mg | ORAL_TABLET | Freq: Two times a day (BID) | ORAL | 2 refills | Status: DC

## 2020-10-09 NOTE — Patient Instructions (Addendum)
Annual exam with pap with mD in 4 to 6 weeks, call if you need me sooner  Please schedule mammogram at checkout Increase clonidine to 0.3mg  two times daily   You are referred to Dr Dorris Fetch , soonest available for diabetes management  You are referred to Podiatry for foot and toenail care  We will send for wheelchair in the next week to [place] visit for sacral ulcer twice weekly till healed.  Barrier cream and tagaderm to be ordered

## 2020-10-09 NOTE — Progress Notes (Signed)
AYEISHA LINDENBERGER     MRN: [id number]      DOB: 1966/09/18   HPI Ms. Ackley is here for follow up and re-evaluation of chronic medical conditions, medication management and review of any available recent lab and radiology data.  Discharged from [place] around 40/98/1191 following hospiatlization from 10/19 to11/12/2019 C/o ulcer on buttocks for weeks C/o limitation in mobility, due to back pain and lower extremity weakness, requests wheelchair for use both inside and outside of her home C/o excessively long and curved toenails , needs Podiatry for foot care ROS Denies recent fever or chills. Denies sinus pressure, nasal congestion, ear pain or sore throat. Denies chest congestion, productive cough or wheezing. Denies chest pains, palpitations and leg swelling Denies abdominal pain, nausea, vomiting,diarrhea or constipation.   Denies dysuria, frequency, hesitancy c/o incontinence. C/o  joint pain, swelling and limitation in mobility. Denies headaches, seizures, numbness, or tingling. Denies depression, anxiety or insomnia.  PE  BP (!) 164/90   Pulse 90   Resp 16   Ht 5\' 1"  (1.549 m)   Wt 175 lb 0.6 oz (79.4 kg)   LMP 05/19/2016   SpO2 99%   BMI 33.07 kg/m    Patient alert and oriented and in no cardiopulmonary distress.  HEENT: No facial asymmetry, EOMI,     Neck supple .  Chest: Clear to auscultation bilaterally.  CVS: S1, S2 no murmurs, no S3.Regular rate.  ABD: Soft non tender.   Ext: No edema  MS: decreased ROM spine,  hips and knees.  Skin: stage 1 sacral ulcer , max diameter approx 2.5 in  Psych: Good eye contact, normal affect. Memory intact not anxious or depressed appearing.  CNS: CN 2-12 intact, grade 4 power in lower extremity  Assessment & Plan  HTN (hypertension), malignant Uncontrolled , increase in medication dose and re evaluate  DASH diet and commitment to daily physical activity for a minimum of 30 minutes discussed and encouraged, as a part  of hypertension management. The importance of attaining a healthy weight is also discussed.  BP/Weight 10/09/2020 10/01/2020 08/28/2020 08/13/2020 08/05/2020 04/18/2020 4/78/2956  Systolic BP 213 086 578 - 469 629 -  Diastolic BP 90 90 528 - 88 88 -  Wt. (Lbs) 175.04 174 207 - 182 182 -  BMI 33.07 32.88 - 39.11 34.39 34.39 34.39  Some encounter information is confidential and restricted. Go to Review Flowsheets activity to see all data.       Lower extremity weakness S/p CVA wit diabetic neuropathy and spine disease. Bilateral lower etrmity weakness with gait disturbance, has had toe amputated also Wheelchair, light weight for safe and improved mobility  Poor mobility Standard wheelchair will improve safe mobility, it is needed  Type 2 diabetes mellitus with vascular disease ([place]) Ms. Litsey is reminded of the importance of commitment to daily physical activity for 30 minutes or more, as able and the need to limit carbohydrate intake to 30 to 60 grams per meal to help with blood sugar control.   The need to take medication as prescribed, test blood sugar as directed, and to call between visits if there is a concern that blood sugar is uncontrolled is also discussed.   Ms. Ashworth is reminded of the importance of daily foot exam, annual eye examination, and good blood sugar, blood pressure and cholesterol control.uncontrolled, glyclhB oer 13 in [place], refer endo asap  Diabetic Labs Latest Ref Rng & Units 08/28/2020 08/27/2020 08/26/2020 08/25/2020 08/24/2020  HbA1c 4.8 - 5.6 % - - - - -  Microalbumin Not estab mg/dL - - - - -  Micro/Creat Ratio <30 mcg/mg creat - - - - -  Chol 0 - 200 mg/dL - - - - -  HDL >40 mg/dL - - - - -  Calc LDL 0 - 99 mg/dL - - - - -  Triglycerides <150 mg/dL - - - - -  Creatinine 0.44 - 1.00 mg/dL 1.29(H) 1.20(H) 1.22(H) 1.32(H) 1.31(H)  GFR >60.00 mL/min - - - - -   BP/Weight 10/09/2020 10/01/2020 08/28/2020 08/13/2020 08/05/2020 04/18/2020 5/79/0383   Systolic BP 338 329 191 - 660 600 -  Diastolic BP 90 90 459 - 88 88 -  Wt. (Lbs) 175.04 174 207 - 182 182 -  BMI 33.07 32.88 - 39.11 34.39 34.39 34.39  Some encounter information is confidential and restricted. Go to Review Flowsheets activity to see all data.   Foot/eye exam completion dates Latest Ref Rng & Units 07/26/2018 03/16/2017  Eye Exam No Retinopathy - Retinopathy(A)  Foot exam Order - - -  Foot Form Completion - Done -        Sacral ulcer, limited to breakdown of skin (HCC) Max diameter approx 3 cm, tagaderm prescribed and refer to home health for twice weekly dressing till healed

## 2020-10-12 DIAGNOSIS — R062 Wheezing: Secondary | ICD-10-CM | POA: Diagnosis not present

## 2020-10-12 DIAGNOSIS — I509 Heart failure, unspecified: Secondary | ICD-10-CM | POA: Diagnosis not present

## 2020-10-13 ENCOUNTER — Encounter: Payer: Self-pay | Admitting: Family Medicine

## 2020-10-13 ENCOUNTER — Telehealth: Payer: Self-pay | Admitting: Family Medicine

## 2020-10-13 DIAGNOSIS — L98421 Non-pressure chronic ulcer of back limited to breakdown of skin: Secondary | ICD-10-CM | POA: Insufficient documentation

## 2020-10-13 NOTE — Assessment & Plan Note (Signed)
Uncontrolled , increase in medication dose and re evaluate  DASH diet and commitment to daily physical activity for a minimum of 30 minutes discussed and encouraged, as a part of hypertension management. The importance of attaining a healthy weight is also discussed.  BP/Weight 10/09/2020 10/01/2020 08/28/2020 08/13/2020 08/05/2020 04/18/2020 6/38/4665  Systolic BP 993 570 177 - 939 030 -  Diastolic BP 90 90 092 - 88 88 -  Wt. (Lbs) 175.04 174 207 - 182 182 -  BMI 33.07 32.88 - 39.11 34.39 34.39 34.39  Some encounter information is confidential and restricted. Go to Review Flowsheets activity to see all data.

## 2020-10-13 NOTE — Assessment & Plan Note (Signed)
Standard wheelchair will improve safe mobility, it is needed

## 2020-10-13 NOTE — Assessment & Plan Note (Signed)
Max diameter approx 3 cm, tagaderm prescribed and refer to home health for twice weekly dressing till healed

## 2020-10-13 NOTE — Telephone Encounter (Signed)
On record review, I am adding lantus to current diabetic management as well as referring her to [facility], please send off the lantus 10 units daily I entered after you speak with her. If I have overlooked any insulin she may be on let me know, I see none, thanks Also visit ready for sending for wheelchair , closed, thanks!

## 2020-10-13 NOTE — Assessment & Plan Note (Addendum)
[name] is reminded of the importance of commitment to daily physical activity for 30 minutes or more, as able and the need to limit carbohydrate intake to 30 to 60 grams per meal to help with blood sugar control.   The need to take medication as prescribed, test blood sugar as directed, and to call between visits if there is a concern that blood sugar is uncontrolled is also discussed.   [name] is reminded of the importance of daily foot exam, annual eye examination, and good blood sugar, blood pressure and cholesterol control.uncontrolled, glyclhB oer 13 in [place], refer endo asap add lantus 10 units daily  Diabetic Labs Latest Ref Rng & Units 08/28/2020 08/27/2020 08/26/2020 08/25/2020 08/24/2020  HbA1c 4.8 - 5.6 % - - - - -  Microalbumin Not estab mg/dL - - - - -  Micro/Creat Ratio <30 mcg/mg creat - - - - -  Chol 0 - 200 mg/dL - - - - -  HDL >40 mg/dL - - - - -  Calc LDL 0 - 99 mg/dL - - - - -  Triglycerides <150 mg/dL - - - - -  Creatinine 0.44 - 1.00 mg/dL 1.29(H) 1.20(H) 1.22(H) 1.32(H) 1.31(H)  GFR >60.00 mL/min - - - - -   BP/Weight 10/09/2020 10/01/2020 08/28/2020 08/13/2020 08/05/2020 04/18/2020 1/59/4707  Systolic BP 615 183 437 - 357 897 -  Diastolic BP 90 90 847 - 88 88 -  Wt. (Lbs) 175.04 174 207 - 182 182 -  BMI 33.07 32.88 - 39.11 34.39 34.39 34.39  Some encounter information is confidential and restricted. Go to Review Flowsheets activity to see all data.   Foot/eye exam completion dates Latest Ref Rng & Units 07/26/2018 03/16/2017  Eye Exam No Retinopathy - Retinopathy(A)  Foot exam Order - - -  Foot Form Completion - Done -

## 2020-10-13 NOTE — Assessment & Plan Note (Signed)
S/p CVA wit diabetic neuropathy and spine disease. Bilateral lower etrmity weakness with gait disturbance, has had toe amputated also Wheelchair, light weight for safe and improved mobility

## 2020-10-14 ENCOUNTER — Encounter: Payer: Self-pay | Admitting: *Deleted

## 2020-10-14 ENCOUNTER — Other Ambulatory Visit: Payer: Self-pay | Admitting: *Deleted

## 2020-10-14 NOTE — Patient Outreach (Signed)
[facility]) Care Management  10/14/2020  [name] 02/26/1971 [id number]  Telephone outreach for possible placement. Pt has hx of diabetes and CVA. She has multiple other chronic diseases also.  Talked with Ms. Carra widowed. She states she needs someone to help her in the house. She resides in a duplex and is by herself the majority of the time. She can barely use her walker. Dr. Moshe Cipro has ordered a wheelchair for her. She resides alone. Her 47 year old grandson stays with her on the weekends and helps out some. She depends on her sister to do her grocery shopping. She does get $70 + food stamps but says it is difficult to buy the food she needs on her budget.  She reports she falls a lot.  She does not have a HCPOA.  We agreed on the following 3 items for intervention:  1)She needs to apply for CAP.  2)She needs help with food.  3)She needs a HCPOA.  Plan: Refer for Social Work Once those items are initially addressed will work with pt on HF, HTN.  Outpatient Encounter Medications as of 10/14/2020  Medication Sig Note  . albuterol (PROAIR HFA) 108 (90 Base) MCG/ACT inhaler INHALE 2 PUFFS EVERY 6 HOURS AS NEEDED FOR SHORTNESS OF BREATH/WHEEZING. (Patient taking differently: Inhale 2 puffs into the lungs every 6 (six) hours as needed for wheezing or shortness of breath.)   . amLODipine (NORVASC) 10 MG tablet TAKE 1 TABLET BY MOUTH ONCE A DAY. (Patient taking differently: Take 10 mg by mouth daily.) 08/14/2020: LF :07/19/20  . apixaban (ELIQUIS) 5 MG TABS tablet Take 5 mg by mouth 2 (two) times daily.   . cloNIDine (CATAPRES) 0.3 MG tablet Take 1 tablet (0.3 mg total) by mouth 2 (two) times daily.   . clopidogrel (PLAVIX) 75 MG tablet TAKE 1 TABLET BY MOUTH DAILY WITH BREAKFAST. (Patient taking differently: Take 75 mg by mouth daily. With breakfast) 08/14/2020: LF :07/19/20  . DULoxetine (CYMBALTA) 30 MG capsule TAKE 2 CAPSULES BY MOUTH ONCE DAILY. (Patient  taking differently: Take 60 mg by mouth daily.) 08/14/2020: LF : 07/19/20  . gabapentin (NEURONTIN) 300 MG capsule TAKE 1 CAPSULE BY MOUTH THREE TIMES A DAY.   . levETIRAcetam (KEPPRA) 750 MG tablet Take 1 tablet (750 mg total) by mouth 2 (two) times daily.   . metoprolol succinate (TOPROL-XL) 50 MG 24 hr tablet TAKE 1 TABLET BY MOUTH TWICE A DAY. (Patient taking differently: Take 50 mg by mouth in the morning and at bedtime.) 08/14/2020: LF : 07/19/20  . montelukast (SINGULAIR) 10 MG tablet TAKE (1) TABLET BY MOUTH AT BEDTIME. (Patient taking differently: Take 10 mg by mouth at bedtime.) 08/14/2020: LF : 07/19/20  . olmesartan (BENICAR) 40 MG tablet TAKE 1 TABLET BY MOUTH ONCE A DAY. (Patient taking differently: Take 40 mg by mouth daily.) 08/14/2020: LF : 07/19/20  . oxybutynin (DITROPAN-XL) 5 MG 24 hr tablet Take 5 mg by mouth at bedtime.   . rosuvastatin (CRESTOR) 10 MG tablet TAKE 1 TABLET BY MOUTH ONCE A DAY. (Patient taking differently: Take 10 mg by mouth daily.) 08/14/2020: LF : 07/19/20  . sitaGLIPtin (JANUVIA) 100 MG tablet Take 100 mg by mouth daily.   Marland Kitchen tiotropium (SPIRIVA HANDIHALER) 18 MCG inhalation capsule Place 18 mcg into inhaler and inhale daily as needed (Shortness of breath).    . traZODone (DESYREL) 50 MG tablet TAKE (1) TABLET BY MOUTH AT BEDTIME. (Patient taking differently: Take 50 mg by mouth  at bedtime.) 08/14/2020: Lf: 07/19/20  . insulin glargine (LANTUS) 100 unit/mL SOPN Inject 10 Units into the skin daily. (Patient not taking: Reported on 10/14/2020)    No facility-administered encounter medications on file as of 10/14/2020.   Fall Risk  10/14/2020 10/09/2020 08/05/2020 04/18/2020 09/13/2019  Falls in the past year? 1 1 0 0 1  Number falls in past yr: 1 1 0 0 1  Injury with Fall? 1 1 0 0 0  Risk for fall due to : History of fall(s);Impaired balance/gait;Medication side effect;Impaired mobility - No Fall Risks No Fall Risks -  Follow up Falls evaluation  completed;Falls prevention discussed - Falls evaluation completed Falls evaluation completed -   Depression screen [facility] 2/9 10/09/2020 08/05/2020 08/05/2020 04/18/2020 09/13/2019  Decreased Interest 0 0 1 3 0  Down, Depressed, Hopeless 3 0 '1 3 3  ' PHQ - 2 Score 3 0 '2 6 3  ' Altered sleeping 3 0 0 0 0  Tired, decreased energy 3 0 0 3 3  Change in appetite 0 0 0 0 0  Feeling bad or failure about yourself  3 0 0 2 0  Trouble concentrating 3 0 0 0 0  Moving slowly or fidgety/restless 0 0 0 0 0  Suicidal thoughts 0 0 0 0 0  PHQ-9 Score 15 0 '2 11 6  ' Difficult doing work/chores - Not difficult at all - Very difficult -  Some recent data might be hidden   Goals established today: Goals Addressed            This Visit's Progress   . COMPLETED: <enter goal here>       "my main problems are my breathing and my diabetes"    . Find Help in My Community       Timeframe:  Short term Priority:  High Start Date:     10/14/20                        Expected End Date:   11/25/20                    Follow Up Date 11/01/20  - follow-up on any referrals for help I am given    Why is this important?    Knowing how and where to find help for yourself or family in your neighborhood and community is an important skill.   You will want to take some steps to learn how.    Notes: Needs LCSW referral to apply for CAP program and provide food resources. Also needs HCPOA completed.    . Lifestyle Change-Hypertension       Timeframe:  Long-Range Goal Priority:  Medium Start Date: 10/14/20                             Expected End Date:   01/23/21                    Follow Up Date: 1/31  - agree to work together to make changes - have a family meeting to talk about healthy habits - learn about high blood pressure    Why is this important?    The changes that you are asked to make may be hard to do.   This is especially true when the changes are life-long.   Knowing why it is important to you is the  first step.  Working on the change with your family or support person helps you not feel alone.   Reward yourself and family or support person when goals are met. This can be an activity you choose like bowling, hiking, biking, swimming or shooting hoops.     Notes:     . Track and Manage My Blood Pressure-Hypertension       Timeframe:  Long-Range Goal Priority:  Medium Start Date:   10/14/20                          Expected End Date: 01/23/21                       Follow Up Date 11/25/20   - check blood pressure daily - write blood pressure results in a log or diary    Why is this important?    You won't feel high blood pressure, but it can still hurt your blood vessels.   High blood pressure can cause heart or kidney problems. It can also cause a stroke.   Making lifestyle changes like losing a little weight or eating less salt will help.   Checking your blood pressure at home and at different times of the day can help to control blood pressure.   If the doctor prescribes medicine remember to take it the way the doctor ordered.   Call the office if you cannot afford the medicine or if there are questions about it.     Notes:     . Track and Manage Symptoms-Heart Failure       Timeframe:  Long-Range Goal Priority:  Medium Start Date:       10/14/20                      Expected End Date:   01/23/21                    Follow Up Date 11/25/20   - begin a heart failure diary - develop a rescue plan - follow rescue plan if symptoms flare-up - know when to call the doctor    Why is this important?    You will be able to handle your symptoms better if you keep track of them.   Making some simple changes to your lifestyle will help.   Eating healthy is one thing you can do to take good care of yourself.    Notes:      We will talk again in the first week of January.  Eulah Pont. Myrtie Neither, MSN, [facility] Gerontological Nurse Practitioner [facility] Care  Management [phone]

## 2020-10-15 ENCOUNTER — Ambulatory Visit: Payer: Medicare Other | Admitting: Nurse Practitioner

## 2020-10-15 ENCOUNTER — Encounter: Payer: Self-pay | Admitting: *Deleted

## 2020-10-15 ENCOUNTER — Other Ambulatory Visit: Payer: Self-pay | Admitting: *Deleted

## 2020-10-15 NOTE — Patient Outreach (Signed)
[facility]) Care Management  10/15/2020  [name] April 16, 1966 [id number]  CSW was able to make initial contact with patient today to perform phone assessment, as well as assess and assist with social work needs and services.  CSW introduced self, explained role and types of services provided through [facility] ([place]).  CSW further explained to patient that CSW works with patient's Geriatric Nurse Practitioner, also with [facility], [name].  CSW then explained the reason for the call, indicating that [name] thought that patient would benefit from social work services and resources to assist with arranging transportation to and from physician appointments, arranging in-home care services, arranging prepared meal services, and assistance with completing her Advanced Directives ([place] documents).  CSW obtained two HIPAA compliant identifiers from patient, which included patient's name and date of birth.  Patient reported that she recently had a Stroke and had to spend time in a skilled nursing facility to receive short-term rehabilitative services, prior to returning home to live alone.  Patient admitted that she definitely needs assistance with all of the above named services and resources, also indicating that she needs assistance obtaining grief counseling, having recently lost her [place] of 15 years.  CSW voiced understanding, agreeing to place all of the following resource information and applications in the mail to patient today: PCS ([place]) Instructions; PCS Application; List of PCS Providers; In-Home Care and Respite Agencies; [place]; SCAT Education administrator Area Transportation) Application ([place]); Transportation Options; Resources for Seniors; [employer]; Family Services of the [place]; [place]; Caregiver Support Groups; [place]; Grief Share Support Groups; Therapists and Psychiatrists in [place], [place]; 12 Common Symptoms of Depression; Relaxation Techniques for Depression; Garment/textile technologist ([name]); [employer] in [place]; [employer] [place], Marketing executive and [employer].  CSW acknowledged that patient will be receiving quite a bit of information from CSW, agreeing to review the contents of the resource packet with patient, once received, to answer any questions that she may have, as well as to assist with application completion and submission, if necessary.  Patient is aware that CSW has already placed a referral for her to Meals on Wheels, through [employer] [facility], per her request and verbal consent.  Patient is also aware that she will be receiving a call directly from a representative with Meals on Wheels to perform the initial phone assessment to determine eligibility.  CSW offered to assist patient with completion of her Advanced Directives, explaining to her that there is a list of [place] affiliated facilities that can assist her with notarizing the documents, included in the Advanced Directives packet.  Patient was encouraged to provide her Primary Care Physician, Dr. Tula Nakayama with a copy to scan into her electronic medical record in Epic, keeping the original copy for herself and providing her appointed healthcare agents with a copy.  CSW reminded patient that she has two transportation benefits through [employer] and Adult Medicaid.  CSW first explained to patient that she is entitled to 24-one-way rides or 12-roundtrip rides, all free of charge, through [employer], providing her with the contact number, encouraging her to contact [employer]  at least 72 hours in advance to schedule a ride.  In addition, CSW explained to patient that she is now eligible to receive Medicaid Transportation, as of October 26, 2020, now that she is a Medicaid recipient.  CSW provided patient with the contact information and encouraged her to contact her Medicaid case worker, at the [place], to schedule her transport.  CSW offered to provide free telephonic counseling services to patient, at least until CSW is able to assist patient with getting established with a community therapist and/or psychiatrist.  Patient admitted to taking her psychotropic medications exactly as prescribed.  Patient was agreeable to having CSW contact her again next week, on Wednesday, October 23, 2020, around [place], BSW, MSW, [employer]  Licensed Clinical Social Worker  [place]  Mailing [place] [address], [place], [place] [address] Physical Address-[address], [place], [place] [address] Toll Free Main # [phone] Fax # [phone] Cell # [phone]  [email or web address]

## 2020-10-15 NOTE — Patient Instructions (Incomplete)

## 2020-10-16 ENCOUNTER — Encounter: Payer: Self-pay | Admitting: *Deleted

## 2020-10-17 ENCOUNTER — Inpatient Hospital Stay (HOSPITAL_COMMUNITY): Admission: RE | Admit: 2020-10-17 | Payer: Medicare Other | Source: Ambulatory Visit

## 2020-10-17 DIAGNOSIS — R269 Unspecified abnormalities of gait and mobility: Secondary | ICD-10-CM | POA: Diagnosis not present

## 2020-10-17 DIAGNOSIS — M6281 Muscle weakness (generalized): Secondary | ICD-10-CM | POA: Diagnosis not present

## 2020-10-17 DIAGNOSIS — J441 Chronic obstructive pulmonary disease with (acute) exacerbation: Secondary | ICD-10-CM | POA: Diagnosis not present

## 2020-10-17 DIAGNOSIS — I639 Cerebral infarction, unspecified: Secondary | ICD-10-CM | POA: Diagnosis not present

## 2020-10-21 NOTE — Telephone Encounter (Signed)
Left message

## 2020-10-22 NOTE — Telephone Encounter (Signed)
OV notes sent to [facility]. Wheelchair RX sent last week

## 2020-10-23 ENCOUNTER — Other Ambulatory Visit: Payer: Self-pay | Admitting: *Deleted

## 2020-10-23 NOTE — Patient Outreach (Signed)
[facility]) Care Management  10/23/2020  [name] August 29, 1966 [id number]  CSW was able to make contact with patient today to follow-up regarding social work services and resources, as well as to ensure that she received the packet of resource information mailed to her home by CSW last week.  Patient confirmed receipt, admitting that she gave the entire packet to her sister, [name] [id number]), who agreed to review the contents, as well as assist with completion and submission of applications.  CSW obtained verbal consent from patient to contact [name] to offer assistance with completion of the applications for CAPS (Community Alternative Program Services) and PCS ([place]), as well as answer any questions that she may have pertaining to the resource information received.  CSW made an attempt to try and contact [name] today; however, she was unavailable at the time of CSW's call.  CSW left a HIPAA compliant message on voicemail for [name] and is currently awaiting a return call.  CSW also encouraged patient to provide [name] with CSW's contact information, and to have her contact CSW at her earliest convenience, first confirming that she has the correct contact information for CSW.  Patient was agreeable to this plan, admitting that she would really like for CSW to continue to communicate directly with [name], as she is currently handling all of patient's affairs until she has recovered from her stroke, "at least to the best of her ability".  Patient's goal is to become completely independent again.   Patient denied having received a call from [facility] of [facility] regarding the referral that [name] placed for her to their Meals on [employer].  CSW reminded patient that there is currently an extensive waiting list for prepared meal and delivery services, encouraging her to utilize the list of food banks, food  pantries and soup kitchens that CSW provided to her in her resource packet.  CSW also provided patient with the contact number for the Meals on Wheels program, encouraging her to contact them periodically to check the status of her application for services.  Patient admitted that she has "plenty of food" at present, declining CSW's offer to have several bags of food delivered directly to her home.  Patient declined CSW's offer to assist her with completion of her Advanced Directives (Living Will and [place] documents), reporting that [name] will also assist her with this process.  CSW encouraged patient to contact CSW if she changes her mind about wanting to receive assistance with completion, of if specific questions arise during the completion process.  CSW tried to offer counseling and supportive services to patient today, to help her better cope with her symptoms of anxiety and depression; however, she reported that she was "not in the mood to talk today".  CSW voiced understanding, explaining to patient that we can try again during our next scheduled telephonic session.   Patient verbalized that she will continue to utilize [employer] to get to and from her physician appointments, and is aware that her 24-one-way rides, or 12-round-trip rides, will be reinstated on Saturday, October 26, 2020, for fiscal year 2022.  CSW reminded patient that she is also eligible to receive [employer], after Friday, October 25, 2020, as patient's Adult Medicaid coverage will go into effect on Saturday, October 26, 2020.  Patient was agreeable to having CSW contact her again in two weeks, on Wednesday,  November 06, 2020, around 10:00AM, unless CSW is able to make contact with [name] in the meantime.  [name], BSW, MSW, LCSW  Licensed Restaurant manager, fast food Health  System  Mailing [place] [address], [place], [place] [address] Physical Address-[address] [place], [place] [address] Toll Free Main # [phone] Fax # [phone] Cell # [phone]  [name]

## 2020-10-28 ENCOUNTER — Telehealth: Payer: Medicare Other | Admitting: Nurse Practitioner

## 2020-10-28 ENCOUNTER — Other Ambulatory Visit: Payer: Self-pay

## 2020-10-29 ENCOUNTER — Other Ambulatory Visit: Payer: Self-pay | Admitting: *Deleted

## 2020-10-29 ENCOUNTER — Encounter (HOSPITAL_COMMUNITY): Payer: Self-pay | Admitting: *Deleted

## 2020-10-29 ENCOUNTER — Inpatient Hospital Stay (HOSPITAL_COMMUNITY)
Admission: EM | Admit: 2020-10-29 | Discharge: 2020-11-14 | DRG: 637 | Disposition: A | Discharge status: Skilled Nursing Facility | Payer: Medicare Other | Attending: Family Medicine | Admitting: Family Medicine

## 2020-10-29 ENCOUNTER — Emergency Department (HOSPITAL_COMMUNITY): Payer: Medicare Other

## 2020-10-29 ENCOUNTER — Other Ambulatory Visit: Payer: Self-pay

## 2020-10-29 DIAGNOSIS — R6889 Other general symptoms and signs: Secondary | ICD-10-CM | POA: Diagnosis not present

## 2020-10-29 DIAGNOSIS — E1165 Type 2 diabetes mellitus with hyperglycemia: Secondary | ICD-10-CM

## 2020-10-29 DIAGNOSIS — N179 Acute kidney failure, unspecified: Secondary | ICD-10-CM

## 2020-10-29 DIAGNOSIS — K2091 Esophagitis, unspecified with bleeding: Secondary | ICD-10-CM | POA: Diagnosis not present

## 2020-10-29 DIAGNOSIS — F410 Panic disorder [episodic paroxysmal anxiety] without agoraphobia: Secondary | ICD-10-CM | POA: Diagnosis present

## 2020-10-29 DIAGNOSIS — R569 Unspecified convulsions: Secondary | ICD-10-CM | POA: Diagnosis not present

## 2020-10-29 DIAGNOSIS — J9811 Atelectasis: Secondary | ICD-10-CM | POA: Diagnosis not present

## 2020-10-29 DIAGNOSIS — E118 Type 2 diabetes mellitus with unspecified complications: Secondary | ICD-10-CM | POA: Diagnosis not present

## 2020-10-29 DIAGNOSIS — I10 Essential (primary) hypertension: Secondary | ICD-10-CM | POA: Diagnosis not present

## 2020-10-29 DIAGNOSIS — D649 Anemia, unspecified: Secondary | ICD-10-CM | POA: Diagnosis not present

## 2020-10-29 DIAGNOSIS — R079 Chest pain, unspecified: Secondary | ICD-10-CM | POA: Diagnosis not present

## 2020-10-29 DIAGNOSIS — Z9181 History of falling: Secondary | ICD-10-CM

## 2020-10-29 DIAGNOSIS — I517 Cardiomegaly: Secondary | ICD-10-CM | POA: Diagnosis not present

## 2020-10-29 DIAGNOSIS — N183 Chronic kidney disease, stage 3 unspecified: Secondary | ICD-10-CM | POA: Diagnosis not present

## 2020-10-29 DIAGNOSIS — Z8249 Family history of ischemic heart disease and other diseases of the circulatory system: Secondary | ICD-10-CM

## 2020-10-29 DIAGNOSIS — I69354 Hemiplegia and hemiparesis following cerebral infarction affecting left non-dominant side: Secondary | ICD-10-CM | POA: Diagnosis not present

## 2020-10-29 DIAGNOSIS — G629 Polyneuropathy, unspecified: Secondary | ICD-10-CM | POA: Diagnosis not present

## 2020-10-29 DIAGNOSIS — I5042 Chronic combined systolic (congestive) and diastolic (congestive) heart failure: Secondary | ICD-10-CM | POA: Diagnosis not present

## 2020-10-29 DIAGNOSIS — G40909 Epilepsy, unspecified, not intractable, without status epilepticus: Secondary | ICD-10-CM

## 2020-10-29 DIAGNOSIS — I480 Paroxysmal atrial fibrillation: Secondary | ICD-10-CM

## 2020-10-29 DIAGNOSIS — F419 Anxiety disorder, unspecified: Secondary | ICD-10-CM | POA: Diagnosis present

## 2020-10-29 DIAGNOSIS — K922 Gastrointestinal hemorrhage, unspecified: Secondary | ICD-10-CM

## 2020-10-29 DIAGNOSIS — I1310 Hypertensive heart and chronic kidney disease without heart failure, with stage 1 through stage 4 chronic kidney disease, or unspecified chronic kidney disease: Secondary | ICD-10-CM | POA: Diagnosis not present

## 2020-10-29 DIAGNOSIS — F32A Depression, unspecified: Secondary | ICD-10-CM | POA: Diagnosis present

## 2020-10-29 DIAGNOSIS — R112 Nausea with vomiting, unspecified: Secondary | ICD-10-CM | POA: Diagnosis not present

## 2020-10-29 DIAGNOSIS — K269 Duodenal ulcer, unspecified as acute or chronic, without hemorrhage or perforation: Secondary | ICD-10-CM | POA: Diagnosis not present

## 2020-10-29 DIAGNOSIS — U071 COVID-19: Secondary | ICD-10-CM | POA: Diagnosis not present

## 2020-10-29 DIAGNOSIS — E860 Dehydration: Secondary | ICD-10-CM | POA: Diagnosis not present

## 2020-10-29 DIAGNOSIS — M47817 Spondylosis without myelopathy or radiculopathy, lumbosacral region: Secondary | ICD-10-CM | POA: Diagnosis not present

## 2020-10-29 DIAGNOSIS — K3189 Other diseases of stomach and duodenum: Secondary | ICD-10-CM | POA: Diagnosis not present

## 2020-10-29 DIAGNOSIS — Z833 Family history of diabetes mellitus: Secondary | ICD-10-CM

## 2020-10-29 DIAGNOSIS — K209 Esophagitis, unspecified without bleeding: Secondary | ICD-10-CM | POA: Diagnosis not present

## 2020-10-29 DIAGNOSIS — G4700 Insomnia, unspecified: Secondary | ICD-10-CM | POA: Diagnosis present

## 2020-10-29 DIAGNOSIS — K264 Chronic or unspecified duodenal ulcer with hemorrhage: Secondary | ICD-10-CM | POA: Diagnosis not present

## 2020-10-29 DIAGNOSIS — E785 Hyperlipidemia, unspecified: Secondary | ICD-10-CM | POA: Diagnosis present

## 2020-10-29 DIAGNOSIS — N1832 Chronic kidney disease, stage 3b: Secondary | ICD-10-CM | POA: Diagnosis present

## 2020-10-29 DIAGNOSIS — B373 Candidiasis of vulva and vagina: Secondary | ICD-10-CM | POA: Diagnosis present

## 2020-10-29 DIAGNOSIS — E559 Vitamin D deficiency, unspecified: Secondary | ICD-10-CM | POA: Diagnosis present

## 2020-10-29 DIAGNOSIS — R5381 Other malaise: Secondary | ICD-10-CM | POA: Diagnosis not present

## 2020-10-29 DIAGNOSIS — R579 Shock, unspecified: Secondary | ICD-10-CM

## 2020-10-29 DIAGNOSIS — G4730 Sleep apnea, unspecified: Secondary | ICD-10-CM | POA: Diagnosis present

## 2020-10-29 DIAGNOSIS — Z01810 Encounter for preprocedural cardiovascular examination: Secondary | ICD-10-CM | POA: Diagnosis not present

## 2020-10-29 DIAGNOSIS — N3289 Other specified disorders of bladder: Secondary | ICD-10-CM | POA: Diagnosis not present

## 2020-10-29 DIAGNOSIS — E1159 Type 2 diabetes mellitus with other circulatory complications: Secondary | ICD-10-CM

## 2020-10-29 DIAGNOSIS — Z789 Other specified health status: Secondary | ICD-10-CM

## 2020-10-29 DIAGNOSIS — M6281 Muscle weakness (generalized): Secondary | ICD-10-CM | POA: Diagnosis not present

## 2020-10-29 DIAGNOSIS — J440 Chronic obstructive pulmonary disease with acute lower respiratory infection: Secondary | ICD-10-CM | POA: Diagnosis not present

## 2020-10-29 DIAGNOSIS — E1110 Type 2 diabetes mellitus with ketoacidosis without coma: Secondary | ICD-10-CM | POA: Diagnosis not present

## 2020-10-29 DIAGNOSIS — G894 Chronic pain syndrome: Secondary | ICD-10-CM | POA: Diagnosis not present

## 2020-10-29 DIAGNOSIS — I482 Chronic atrial fibrillation, unspecified: Secondary | ICD-10-CM | POA: Diagnosis not present

## 2020-10-29 DIAGNOSIS — Z743 Need for continuous supervision: Secondary | ICD-10-CM | POA: Diagnosis not present

## 2020-10-29 DIAGNOSIS — R062 Wheezing: Secondary | ICD-10-CM | POA: Diagnosis not present

## 2020-10-29 DIAGNOSIS — Z20822 Contact with and (suspected) exposure to covid-19: Secondary | ICD-10-CM | POA: Diagnosis present

## 2020-10-29 DIAGNOSIS — R739 Hyperglycemia, unspecified: Secondary | ICD-10-CM

## 2020-10-29 DIAGNOSIS — Z87891 Personal history of nicotine dependence: Secondary | ICD-10-CM

## 2020-10-29 DIAGNOSIS — I130 Hypertensive heart and chronic kidney disease with heart failure and stage 1 through stage 4 chronic kidney disease, or unspecified chronic kidney disease: Secondary | ICD-10-CM | POA: Diagnosis present

## 2020-10-29 DIAGNOSIS — I4891 Unspecified atrial fibrillation: Secondary | ICD-10-CM | POA: Diagnosis not present

## 2020-10-29 DIAGNOSIS — E1122 Type 2 diabetes mellitus with diabetic chronic kidney disease: Secondary | ICD-10-CM | POA: Diagnosis not present

## 2020-10-29 DIAGNOSIS — A0472 Enterocolitis due to Clostridium difficile, not specified as recurrent: Secondary | ICD-10-CM | POA: Diagnosis not present

## 2020-10-29 DIAGNOSIS — R578 Other shock: Secondary | ICD-10-CM | POA: Diagnosis present

## 2020-10-29 DIAGNOSIS — J449 Chronic obstructive pulmonary disease, unspecified: Secondary | ICD-10-CM | POA: Diagnosis not present

## 2020-10-29 DIAGNOSIS — R1110 Vomiting, unspecified: Secondary | ICD-10-CM | POA: Diagnosis not present

## 2020-10-29 DIAGNOSIS — Z7902 Long term (current) use of antithrombotics/antiplatelets: Secondary | ICD-10-CM

## 2020-10-29 DIAGNOSIS — R000 Tachycardia, unspecified: Secondary | ICD-10-CM | POA: Diagnosis not present

## 2020-10-29 DIAGNOSIS — K920 Hematemesis: Secondary | ICD-10-CM | POA: Diagnosis not present

## 2020-10-29 DIAGNOSIS — R531 Weakness: Secondary | ICD-10-CM | POA: Diagnosis not present

## 2020-10-29 DIAGNOSIS — Z79899 Other long term (current) drug therapy: Secondary | ICD-10-CM

## 2020-10-29 DIAGNOSIS — K2981 Duodenitis with bleeding: Secondary | ICD-10-CM | POA: Diagnosis not present

## 2020-10-29 DIAGNOSIS — Z7901 Long term (current) use of anticoagulants: Secondary | ICD-10-CM

## 2020-10-29 DIAGNOSIS — E119 Type 2 diabetes mellitus without complications: Secondary | ICD-10-CM | POA: Diagnosis not present

## 2020-10-29 DIAGNOSIS — A419 Sepsis, unspecified organism: Secondary | ICD-10-CM | POA: Diagnosis not present

## 2020-10-29 DIAGNOSIS — R2681 Unsteadiness on feet: Secondary | ICD-10-CM | POA: Diagnosis not present

## 2020-10-29 DIAGNOSIS — M47816 Spondylosis without myelopathy or radiculopathy, lumbar region: Secondary | ICD-10-CM | POA: Diagnosis not present

## 2020-10-29 DIAGNOSIS — I499 Cardiac arrhythmia, unspecified: Secondary | ICD-10-CM | POA: Diagnosis not present

## 2020-10-29 DIAGNOSIS — Z794 Long term (current) use of insulin: Secondary | ICD-10-CM

## 2020-10-29 DIAGNOSIS — I639 Cerebral infarction, unspecified: Secondary | ICD-10-CM | POA: Diagnosis not present

## 2020-10-29 DIAGNOSIS — I509 Heart failure, unspecified: Secondary | ICD-10-CM | POA: Diagnosis not present

## 2020-10-29 DIAGNOSIS — Z6832 Body mass index (BMI) 32.0-32.9, adult: Secondary | ICD-10-CM

## 2020-10-29 DIAGNOSIS — I69054 Hemiplegia and hemiparesis following nontraumatic subarachnoid hemorrhage affecting left non-dominant side: Secondary | ICD-10-CM | POA: Diagnosis not present

## 2020-10-29 DIAGNOSIS — T699XXA Effect of reduced temperature, unspecified, initial encounter: Secondary | ICD-10-CM | POA: Diagnosis not present

## 2020-10-29 DIAGNOSIS — I110 Hypertensive heart disease with heart failure: Secondary | ICD-10-CM | POA: Diagnosis not present

## 2020-10-29 DIAGNOSIS — Z825 Family history of asthma and other chronic lower respiratory diseases: Secondary | ICD-10-CM

## 2020-10-29 HISTORY — DX: Type 2 diabetes mellitus with ketoacidosis without coma: E11.10

## 2020-10-29 HISTORY — DX: Patient's noncompliance with other medical treatment and regimen due to unspecified reason: Z91.199

## 2020-10-29 HISTORY — DX: Patient's noncompliance with other medical treatment and regimen: Z91.19

## 2020-10-29 HISTORY — DX: Personal history of other diseases of the circulatory system: Z86.79

## 2020-10-29 HISTORY — DX: Shock, unspecified: R57.9

## 2020-10-29 LAB — CBG MONITORING, ED
Glucose-Capillary: >600 mg/dL (ref 70–99)
Glucose-Capillary: >600 mg/dL (ref 70–99)
Glucose-Capillary: 563 mg/dL (ref 70–99)
Glucose-Capillary: 478 mg/dL — ABNORMAL HIGH (ref 70–99)
Glucose-Capillary: 409 mg/dL — ABNORMAL HIGH (ref 70–99)
Glucose-Capillary: 427 mg/dL — ABNORMAL HIGH (ref 70–99)
Glucose-Capillary: 333 mg/dL — ABNORMAL HIGH (ref 70–99)
Glucose-Capillary: 402 mg/dL — ABNORMAL HIGH (ref 70–99)
Glucose-Capillary: 299 mg/dL — ABNORMAL HIGH (ref 70–99)
Glucose-Capillary: 214 mg/dL — ABNORMAL HIGH (ref 70–99)

## 2020-10-29 LAB — RESP PANEL BY RT-PCR (FLU A&B, COVID) ARPGX2
Influenza A by PCR: NEGATIVE
Influenza B by PCR: NEGATIVE
SARS Coronavirus 2 by RT PCR: NEGATIVE

## 2020-10-29 LAB — LACTIC ACID, PLASMA
Lactic Acid, Venous: 2.3 mmol/L (ref 0.5–1.9)
Lactic Acid, Venous: 1.9 mmol/L (ref 0.5–1.9)

## 2020-10-29 LAB — BASIC METABOLIC PANEL
Anion gap: 15 (ref 5–15)
BUN: 53 mg/dL — ABNORMAL HIGH (ref 6–20)
CO2: 20 mmol/L — ABNORMAL LOW (ref 22–32)
Calcium: 7.8 mg/dL — ABNORMAL LOW (ref 8.9–10.3)
Chloride: 105 mmol/L (ref 98–111)
Creatinine, Ser: 1.83 mg/dL — ABNORMAL HIGH (ref 0.44–1.00)
GFR, Estimated: 32 mL/min — ABNORMAL LOW (ref >60)
Glucose, Bld: 320 mg/dL — ABNORMAL HIGH (ref 70–99)
Potassium: 3.5 mmol/L (ref 3.5–5.1)
Sodium: 140 mmol/L (ref 135–145)

## 2020-10-29 LAB — CBC WITH DIFFERENTIAL/PLATELET
Abs Immature Granulocytes: 0.06 10*3/uL (ref 0.00–0.07)
Basophils Absolute: 0.1 10*3/uL (ref 0.0–0.1)
Basophils Relative: 0 %
Eosinophils Absolute: 0 10*3/uL (ref 0.0–0.5)
Eosinophils Relative: 0 %
HCT: 53.9 % — ABNORMAL HIGH (ref 36.0–46.0)
Hemoglobin: 16.6 g/dL — ABNORMAL HIGH (ref 12.0–15.0)
Immature Granulocytes: 0 %
Lymphocytes Relative: 5 %
Lymphs Abs: 0.8 10*3/uL (ref 0.7–4.0)
MCH: 29.2 pg (ref 26.0–34.0)
MCHC: 30.8 g/dL (ref 30.0–36.0)
MCV: 94.9 fL (ref 80.0–100.0)
Monocytes Absolute: 0.5 10*3/uL (ref 0.1–1.0)
Monocytes Relative: 3 %
Neutro Abs: 15.7 10*3/uL — ABNORMAL HIGH (ref 1.7–7.7)
Neutrophils Relative %: 92 %
Platelets: 327 10*3/uL (ref 150–400)
RBC: 5.68 MIL/uL — ABNORMAL HIGH (ref 3.87–5.11)
RDW: 14.9 % (ref 11.5–15.5)
WBC: 17.1 10*3/uL — ABNORMAL HIGH (ref 4.0–10.5)
nRBC: 0 % (ref 0.0–0.2)

## 2020-10-29 LAB — URINALYSIS, ROUTINE W REFLEX MICROSCOPIC
Bilirubin Urine: NEGATIVE
Glucose, UA: >=500 mg/dL — AB
Ketones, ur: 20 mg/dL — AB
Nitrite: NEGATIVE
Protein, ur: >=300 mg/dL — AB
Specific Gravity, Urine: 1.016 (ref 1.005–1.030)
WBC, UA: >50 WBC/hpf — ABNORMAL HIGH (ref 0–5)
pH: 5 (ref 5.0–8.0)

## 2020-10-29 LAB — APTT: aPTT: 28 seconds (ref 24–36)

## 2020-10-29 LAB — BLOOD GAS, VENOUS
Acid-base deficit: 12.9 mmol/L — ABNORMAL HIGH (ref 0.0–2.0)
Bicarbonate: 14.2 mmol/L — ABNORMAL LOW (ref 20.0–28.0)
FIO2: 21
O2 Saturation: 87.2 %
Patient temperature: 34.7
pCO2, Ven: 31.1 mmHg — ABNORMAL LOW (ref 44.0–60.0)
pH, Ven: 7.242 — ABNORMAL LOW (ref 7.250–7.430)
pO2, Ven: 61.5 mmHg — ABNORMAL HIGH (ref 32.0–45.0)

## 2020-10-29 LAB — PROTIME-INR
INR: 1.1 (ref 0.8–1.2)
Prothrombin Time: 13.9 seconds (ref 11.4–15.2)

## 2020-10-29 LAB — COMPREHENSIVE METABOLIC PANEL
ALT: 18 U/L (ref 0–44)
AST: 7 U/L — ABNORMAL LOW (ref 15–41)
Albumin: 3.2 g/dL — ABNORMAL LOW (ref 3.5–5.0)
Alkaline Phosphatase: 151 U/L — ABNORMAL HIGH (ref 38–126)
Anion gap: 25 — ABNORMAL HIGH (ref 5–15)
BUN: 61 mg/dL — ABNORMAL HIGH (ref 6–20)
CO2: 17 mmol/L — ABNORMAL LOW (ref 22–32)
Calcium: 8.8 mg/dL — ABNORMAL LOW (ref 8.9–10.3)
Chloride: 91 mmol/L — ABNORMAL LOW (ref 98–111)
Creatinine, Ser: 2.47 mg/dL — ABNORMAL HIGH (ref 0.44–1.00)
GFR, Estimated: 22 mL/min — ABNORMAL LOW (ref >60)
Glucose, Bld: 874 mg/dL (ref 70–99)
Potassium: 4.6 mmol/L (ref 3.5–5.1)
Sodium: 133 mmol/L — ABNORMAL LOW (ref 135–145)
Total Bilirubin: 1.3 mg/dL — ABNORMAL HIGH (ref 0.3–1.2)
Total Protein: 7.5 g/dL (ref 6.5–8.1)

## 2020-10-29 LAB — BETA-HYDROXYBUTYRIC ACID
Beta-Hydroxybutyric Acid: 7.16 mmol/L — ABNORMAL HIGH (ref 0.05–0.27)
Beta-Hydroxybutyric Acid: 0.95 mmol/L — ABNORMAL HIGH (ref 0.05–0.27)

## 2020-10-29 LAB — POC SARS CORONAVIRUS 2 AG -  ED: SARS Coronavirus 2 Ag: NEGATIVE

## 2020-10-29 LAB — MAGNESIUM: Magnesium: 2.2 mg/dL (ref 1.7–2.4)

## 2020-10-29 LAB — OSMOLALITY: Osmolality: 353 mOsm/kg (ref 275–295)

## 2020-10-29 LAB — TSH: TSH: 0.519 u[IU]/mL (ref 0.350–4.500)

## 2020-10-29 MED ORDER — SODIUM CHLORIDE 0.9 % IV BOLUS (SEPSIS)
2400.0000 mL | Freq: Once | INTRAVENOUS | Status: AC
  Administered 2020-10-29: 2400 mL via INTRAVENOUS

## 2020-10-29 MED ORDER — ONDANSETRON HCL 4 MG/2ML IJ SOLN
INTRAMUSCULAR | Status: AC
  Administered 2020-10-29: 4 mg via INTRAVENOUS
  Filled 2020-10-29: qty 2

## 2020-10-29 MED ORDER — INSULIN REGULAR(HUMAN) IN NACL 100-0.9 UT/100ML-% IV SOLN
INTRAVENOUS | Status: DC
  Administered 2020-10-29: 4.6 [IU]/h via INTRAVENOUS
  Administered 2020-10-29: 10 [IU]/h via INTRAVENOUS
  Filled 2020-10-29 (×3): qty 100

## 2020-10-29 MED ORDER — PIPERACILLIN-TAZOBACTAM 3.375 G IVPB
3.3750 g | Freq: Three times a day (TID) | INTRAVENOUS | Status: DC
  Administered 2020-10-30 (×2): 3.375 g via INTRAVENOUS
  Filled 2020-10-29 (×2): qty 50

## 2020-10-29 MED ORDER — DEXTROSE IN LACTATED RINGERS 5 % IV SOLN
INTRAVENOUS | Status: DC
Start: 2020-10-29 — End: 2020-10-29

## 2020-10-29 MED ORDER — PIPERACILLIN-TAZOBACTAM 3.375 G IVPB 30 MIN
3.3750 g | Freq: Once | INTRAVENOUS | Status: AC
  Administered 2020-10-29: 3.375 g via INTRAVENOUS
  Filled 2020-10-29: qty 50

## 2020-10-29 MED ORDER — FENTANYL CITRATE (PF) 100 MCG/2ML IJ SOLN
50.0000 ug | Freq: Once | INTRAMUSCULAR | Status: AC
Start: 2020-10-29 — End: 2020-10-29
  Administered 2020-10-29: 50 ug via INTRAVENOUS
  Filled 2020-10-29: qty 2

## 2020-10-29 MED ORDER — PIPERACILLIN-TAZOBACTAM 3.375 G IVPB 30 MIN: 3.3750 g | Freq: Once | INTRAVENOUS | Status: DC

## 2020-10-29 MED ORDER — DEXTROSE 50 % IV SOLN: 0.0000 mL | INTRAVENOUS | Status: DC | PRN

## 2020-10-29 MED ORDER — APIXABAN 5 MG PO TABS
5.0000 mg | ORAL_TABLET | Freq: Two times a day (BID) | ORAL | Status: DC
Start: 2020-10-29 — End: 2020-11-10
  Administered 2020-10-30 – 2020-11-10 (×21): 5 mg via ORAL
  Filled 2020-10-29 (×23): qty 1

## 2020-10-29 MED ORDER — AMLODIPINE BESYLATE 5 MG PO TABS
10.0000 mg | ORAL_TABLET | Freq: Every day | ORAL | Status: DC
  Administered 2020-10-31 – 2020-11-14 (×13): 10 mg via ORAL
  Filled 2020-10-29 (×15): qty 2

## 2020-10-29 MED ORDER — LACTATED RINGERS IV SOLN: INTRAVENOUS | Status: DC

## 2020-10-29 MED ORDER — LACTATED RINGERS IV BOLUS
20.0000 mL/kg | Freq: Once | INTRAVENOUS | Status: AC
  Administered 2020-10-29: 1578 mL via INTRAVENOUS

## 2020-10-29 MED ORDER — ROSUVASTATIN CALCIUM 10 MG PO TABS
10.0000 mg | ORAL_TABLET | Freq: Every day | ORAL | Status: DC
  Administered 2020-10-31 – 2020-11-14 (×12): 10 mg via ORAL
  Filled 2020-10-29 (×17): qty 1

## 2020-10-29 MED ORDER — MIDAZOLAM HCL 5 MG/5ML IJ SOLN
INTRAMUSCULAR | Status: AC
  Administered 2020-10-29: 4 mg via INTRAVENOUS
  Filled 2020-10-29: qty 5

## 2020-10-29 MED ORDER — POTASSIUM CHLORIDE 10 MEQ/100ML IV SOLN
10.0000 meq | INTRAVENOUS | Status: AC
  Administered 2020-10-29 (×2): 10 meq via INTRAVENOUS
  Filled 2020-10-29 (×2): qty 100

## 2020-10-29 MED ORDER — METOPROLOL SUCCINATE ER 50 MG PO TB24
50.0000 mg | ORAL_TABLET | Freq: Two times a day (BID) | ORAL | Status: DC
  Administered 2020-10-30 – 2020-11-14 (×29): 50 mg via ORAL
  Filled 2020-10-29 (×31): qty 1

## 2020-10-29 MED ORDER — POTASSIUM CHLORIDE 10 MEQ/100ML IV SOLN: 10.0000 meq | INTRAVENOUS | Status: DC

## 2020-10-29 MED ORDER — VANCOMYCIN HCL 1500 MG/300ML IV SOLN
1500.0000 mg | Freq: Once | INTRAVENOUS | Status: AC
  Administered 2020-10-29: 1500 mg via INTRAVENOUS
  Filled 2020-10-29: qty 300

## 2020-10-29 MED ORDER — ONDANSETRON HCL 4 MG/2ML IJ SOLN
4.0000 mg | Freq: Four times a day (QID) | INTRAMUSCULAR | Status: DC | PRN
  Administered 2020-10-29 – 2020-11-11 (×7): 4 mg via INTRAVENOUS
  Filled 2020-10-29 (×8): qty 2

## 2020-10-29 MED ORDER — MIDAZOLAM HCL 2 MG/2ML IJ SOLN: 4.0000 mg | Freq: Once | INTRAMUSCULAR | Status: AC

## 2020-10-29 MED ORDER — DEXTROSE IN LACTATED RINGERS 5 % IV SOLN: INTRAVENOUS | Status: DC

## 2020-10-29 MED ORDER — VANCOMYCIN HCL IN DEXTROSE 1-5 GM/200ML-% IV SOLN
1000.0000 mg | Freq: Once | INTRAVENOUS | Status: DC
Start: 2020-10-29 — End: 2020-10-29

## 2020-10-29 MED ORDER — ONDANSETRON HCL 4 MG/2ML IJ SOLN: 4.0000 mg | Freq: Once | INTRAMUSCULAR | Status: AC

## 2020-10-29 MED ORDER — VANCOMYCIN HCL IN DEXTROSE 1-5 GM/200ML-% IV SOLN: 1000.0000 mg | INTRAVENOUS | Status: DC

## 2020-10-29 NOTE — H&P (Addendum)
[facility] History & Physical  Rob Calcium 10/29/2020  Chief Complaint:  HPI: The patient is a 55 y.o. year-old w/ hx of CVA, seizures, obesity, tobacco abuse, CM EF 25%, HL, hx GIB, HTN, diabetes on insulin, Charcot foot, COPD, cocaine abuse, CKD 3, chronic syst/ diast CHF, anemia, etoh use presented to ED by EMS w/ c/o laying in bed for 2 days w/o medications.  She had reportedly been vomiting and having diarrhea and has not been able to keep down her medication. EMS reported pt's boyfriend left 2 days ago and did not return, we has helping to take care of her and give her her medications. EMS BP 80/60, temp 94.5, HR 160 and CBG 414. In ED RR 31, temp 94.8, HR 145 - 162, RA 97% sat, BP 70/30 >> 150/70 after cardioversion. BS > 600, Na 133 CO2 17  AG 25  Glu 874  BUN 61  Cr 2.47  Ca 8.8  Alb 3.2  LFT's okay, egfr 22  WBC 17K  Hb 16.6  plt 327  Lactic 2.3 > 1.9  Beta-hydroxy ^ 7.16, tsh 0.5   CXR showed LLL atelectasis, no consolidation. EKG showed wide-complex tachycardia at 181 bpm afib w/ RVR, QRS 125 msec. Repeat ekg after cardioversion x 2 (120J, 150J) showed sinus tach at 104 bpm.  In ED she was cardioverted as above and started on IV insulin drip and received IV vanc 1500 mg x 1, IV zosyn also ordered. Got 4 L total bolus IVF"s. Blood and urine cx's sent.  Asked to see for admission.   Pt seen in room, lives alone, uses a walker or wheelchair mostly, needs help caring for herself.  Denies any recent CP or abd pain, +n/v and diarrhea, no fevers or chills.  Vague historian.      ROS  denies CP  no joint pain   no HA  no blurry vision  no rash  no dysuria  no difficulty voiding  no change in urine color   Past Medical History  Past Medical History:  Diagnosis Date  . Alcohol use   . Ankle fracture, lateral malleolus, closed 12/30/2011  . Anxiety   . Breast mass, left 12/15/2011  . Chronic anemia   . Chronic combined systolic and diastolic CHF  (congestive heart failure) ([place])   . CKD (chronic kidney disease), stage III ([place])    pt denies  . Cocaine abuse ([place])   . COPD (chronic obstructive pulmonary disease) ([place])   . Diabetes mellitus, type 2 ([place])   . Diabetic Charcot foot ([place]) 12/30/2011  . Essential hypertension   . History of GI bleed 12/05/2009   Qualifier: Diagnosis of  By: Zeb Comfort    . Hyperlipidemia   . Hypertensive cardiomyopathy ([place]) 11/08/2012   a. 05/2016: echo showing EF of 20-25% with normal cors by cath in 07/2016.  . Morbid obesity ([place]) 01/31/2008   Qualifier: Diagnosis of  By: Lenn Cal    . Noncompliance with medication regimen   . Obesity   . Panic attacks   . PAT (paroxysmal atrial tachycardia) ([place])    a. started on amiodarone 07/2017 for this.  . Seizures ([place]) 10/01/2020  . Sleep apnea    does not use cpap  . Stroke ([place])    06/24/17  . Tobacco abuse   . Urinary incontinence    Past Surgical History  Past Surgical History:  Procedure Laterality Date  . BREAST BIOPSY    .  CARDIAC CATHETERIZATION N/A 07/28/2016   Procedure: Left Heart Cath and Coronary Angiography;  Surgeon: Jettie Booze, MD;  Location: [facility];  Service: Cardiovascular;  Laterality: N/A;  . COLONOSCOPY N/A 05/10/2019   Procedure: COLONOSCOPY;  Surgeon: Danie Binder, MD;  [facility];  Service: Endoscopy;  Laterality: N/A;  Phenergan 12.5 mg IV in pre-op  . DILATION AND CURETTAGE OF UTERUS    . I & D EXTREMITY Bilateral 09/22/2017   Procedure: BILATERAL DEBRIDEMENT LEG/FOOT ULCERS, APPLY VERAFLO WOUND VAC;  Surgeon: Newt Minion, MD;  Location: [facility];  Service: Orthopedics;  Laterality: Bilateral;  . I & D EXTREMITY Right 10/11/2018   Procedure: IRRIGATION AND DEBRIDEMENT RIGHT HAND;  Surgeon: [name], MD;  Location: [facility];  Service: Orthopedics;  Laterality: Right;  . I & D EXTREMITY Right 10/13/2018   Procedure: REPEAT IRRIGATION AND DEBRIDEMENT RIGHT HAND;  Surgeon: [name], MD;  Location: [facility];  Service: Orthopedics;  Laterality: Right;  . I & D EXTREMITY Right 11/22/2018   Procedure: IRRIGATION AND DEBRIDEMENT AND PINNING RIGHT HAND;  Surgeon: [name], MD;  Location: [facility];  Service: Orthopedics;  Laterality: Right;  . IR RADIOLOGIST EVAL & MGMT  07/05/2018  . POLYPECTOMY  05/10/2019   Procedure: POLYPECTOMY;  Surgeon: Danie Binder, MD;  [facility];  Service: Endoscopy;;  . SKIN SPLIT GRAFT Bilateral 09/28/2017   Procedure: BILATERAL SPLIT THICKNESS SKIN GRAFT LEGS/FEET AND APPLY VAC;  Surgeon: Newt Minion, MD;  Location: [facility];  Service: Orthopedics;  Laterality: Bilateral;  . SKIN SPLIT GRAFT Right 11/22/2018   Procedure: SKIN GRAFT SPLIT THICKNESS;  Surgeon: [name], MD;  Location: [facility];  Service: Orthopedics;  Laterality: Right;   Family History  Family History  Problem Relation Age of Onset  . Hypertension Mother   . Heart attack Mother   . Hypertension Father        CABG   . Hypertension Sister   . Hypertension Brother   . Hypertension Sister   . Cancer Sister        breast   . Arthritis Other   . Cancer Other   . Diabetes Other   . Asthma Other   . Hypertension Daughter   . Hypertension Son    Social History  reports that she quit smoking about 3 years ago. Her smoking use included cigarettes. She has a 20.00 pack-year smoking history. She has never used smokeless tobacco. She reports previous alcohol use. She reports previous drug use. Frequency: 3.00 times per week. Drugs: Marijuana and Cocaine. Allergies No Known Allergies Home medications Prior to Admission medications   Medication Sig Start Date End Date Taking? Authorizing Provider  albuterol (PROAIR HFA) 108 (90 Base) MCG/ACT inhaler INHALE 2 PUFFS EVERY 6 HOURS AS NEEDED FOR SHORTNESS OF BREATH/WHEEZING. Patient taking differently: Inhale 2 puffs into the lungs every 6 (six) hours as needed for wheezing or shortness of breath. 08/05/20  Yes  [name], MD  amLODipine (NORVASC) 10 MG tablet TAKE 1 TABLET BY MOUTH ONCE A DAY. Patient taking differently: Take 10 mg by mouth daily. 07/19/20  Yes [name], MD  apixaban (ELIQUIS) 5 MG TABS tablet Take 5 mg by mouth 2 (two) times daily.   Yes [provider]  cloNIDine (CATAPRES) 0.3 MG tablet Take 1 tablet (0.3 mg total) by mouth 2 (two) times daily. 10/09/20  Yes [name], MD  clopidogrel (PLAVIX) 75 MG  tablet TAKE 1 TABLET BY MOUTH DAILY WITH BREAKFAST. Patient taking differently: Take 75 mg by mouth daily. With breakfast 07/19/20  Yes [name], MD  DULoxetine (CYMBALTA) 30 MG capsule TAKE 2 CAPSULES BY MOUTH ONCE DAILY. Patient taking differently: Take 60 mg by mouth daily. 07/19/20  Yes [name], MD  gabapentin (NEURONTIN) 300 MG capsule TAKE 1 CAPSULE BY MOUTH THREE TIMES A DAY. 11/14/19  Yes Newt Minion, MD  levETIRAcetam (KEPPRA) 750 MG tablet Take 1 tablet (750 mg total) by mouth 2 (two) times daily. 08/28/20  Yes Aline August, MD  metoprolol succinate (TOPROL-XL) 50 MG 24 hr tablet TAKE 1 TABLET BY MOUTH TWICE A DAY. Patient taking differently: Take 50 mg by mouth in the morning and at bedtime. 08/13/20  Yes [name], MD  montelukast (SINGULAIR) 10 MG tablet TAKE (1) TABLET BY MOUTH AT BEDTIME. Patient taking differently: Take 10 mg by mouth at bedtime. 07/19/20  Yes [name], MD  olmesartan (BENICAR) 40 MG tablet TAKE 1 TABLET BY MOUTH ONCE A DAY. Patient taking differently: Take 40 mg by mouth daily. 08/13/20  Yes [name], MD  oxybutynin (DITROPAN-XL) 5 MG 24 hr tablet Take 5 mg by mouth at bedtime.   Yes [provider]  rosuvastatin (CRESTOR) 10 MG tablet TAKE 1 TABLET BY MOUTH ONCE A DAY. Patient taking differently: Take 10 mg by mouth daily. 07/19/20  Yes [name], MD  sitaGLIPtin (JANUVIA) 100 MG tablet Take 100 mg by mouth daily.   Yes [provider]   tiotropium (SPIRIVA HANDIHALER) 18 MCG inhalation capsule Place 18 mcg into inhaler and inhale daily as needed (Shortness of breath).    Yes [provider]  traZODone (DESYREL) 50 MG tablet TAKE (1) TABLET BY MOUTH AT BEDTIME. Patient taking differently: Take 50 mg by mouth at bedtime. 07/19/20  Yes [name], MD  insulin glargine (LANTUS) 100 unit/mL SOPN Inject 10 Units into the skin daily. Patient not taking: No sig reported 10/14/20   [name], MD       Exam Gen lying flat on stretcher w/ warming blanket, not in distress Chronically ill appearing, tired No rash, cyanosis or gangrene Sclera anicteric, throat clear and slightly moist  No jvd or bruits Chest clear bilat to bases, no rales or wheezing RRR no MRG Abd soft ntnd no mass or ascites +bs obese GU defer MS no joint effusions or deformity Ext no leg or UE edema, no wounds or ulcers Neuro is alert, Ox 3, moves all ext, did not get her up    Home meds:  - olmesartan 40/ metoprolol xl 50 hs/ clonidine 0.3 bid/ norvasc 10  - eliquis 5 bid/ crestor 10 qd/ plavix 75  - insulin lantus 10u qd/ januvia 100 qd  - neurontin 300 tid/ keppra 750 bid/ cymbalta 60 qd/ trazodone 50 hs  - singulair 10 hs/ spiriva 18ug qd  - prn's/ vitamins/ supplements     RR 31, temp 94.8, HR 145 - 162, RA 97% sat, BP 70/30 >> 150/70 after cardioversion. BS > 600, Na 133 CO2 17  AG 25  Glu 874  BUN 61  Cr 2.47  Ca 8.8  Alb 3.2  LFT's okay, egfr 22  WBC 17K  Hb 16.6  plt 327  Lactic 2.3 > 1.9  Beta-hydroxy ^ 7.16, tsh 0.5     CXR showed LLL atelectasis, no consolidation   EKG #1 wide-complex tachycardia 181 bpm afib  w/ RVR, QRS 125 msec    EKG #2 after cardioversion x 2 (120J, 150J) showed sinus tach 104 bpm     Assessment/ Plan: 1. Atrial fib w/ RVR - cardioversion x 2 in ED , successfully returned to sinus tach. Hx of afib, cont BB and eliquis.  2. Hypotension / shock - vol depletion from DKA and /or sepsis and/or  afib w/ RVR.  Much better in ED after cardioversion, 4 L bolus and insulin. Cont IV vanc/ zosyn started in ED. F/u cx's.  3. IDDM w/ DKA - insulin infusion protocol. Continue.  4. AKI on CKD III- b/l creat 1.2- 1.6, up mid 2's here, likely due to DKA/ dehydration+ ARB effect. Plan cont IVF"s , treat DKA, hold ARB.  5. HTN - BP's low , now high after cardioverted to sinus rhythm. Will cont home metoprolol and norvasc, hold others for now.  6. Debility - states uses walker and WC at home 7. Chronic combined syst /diast CHF - pt looks dry, not on diuretic per home med list though pt states she is on a fluid pill. Cont IVF"s overnight, watch vol status closely.  8. Vol depletion - 4 L bolus + DKA IVF"s going 9. Seizure d/o - cont keppra, she says she is taking it 10. COPD - cont home meds 11. Depression - resume meds soon      Kelly Splinter  MD 10/29/2020, 6:46 PM

## 2020-10-29 NOTE — ED Triage Notes (Signed)
Pt brought in by [facility] from home with c/o laying unclothed in her bed x 2 days without meds. EMS reports pt's boyfriend left 2 days ago and hasn't returned. He usually helps take of her and give her medications which she now hasn't had in 2 days. EMS reports BP 80/60, temp 94.5, HR 160, CBG 414.

## 2020-10-29 NOTE — Patient Outreach (Signed)
[facility]) Care Management  10/29/2020  [name] 18-May-1966 [id number]   CSW received an incoming call from patient's sister, [name], in response to the HIPAA compliant message left on voicemail for [name] by CSW last week, per patient's request and verbal consent.  [name] confirmed receipt of the resources, indicating that she has already begun completion of the applications, but wanted to know if she should hold off, now that patient has been admitted into the hospital.  [name] went on to explain that patient now requires more care and supervision then what [employer] manager) and/or P[facility] (Personal Care [facility]) can offer.  CSW voiced understanding, encouraging [name] to hold off on application completion for now.  CSW then notified the hospital liaisons, as well as patient's geriatric nurse practitioner, all with [facility] Care Management, in addition to patient's Primary Care Physician, Dr. Syliva Overman, of patient's current location and medical status.  CSW emphasized the importance of patient receiving long-term care placement into an assisted living facility, as she is no longer able to perform activities of daily living independently.  CSW will continue to follow and resume social work services once patient is discharged back into the community.  Danford Bad, BSW, MSW, LCSW  Licensed [employer] manager, [employer]  Mailing [place] [address], [place], [place] [address] Physical Address-[address], [place], [place] [address] Toll Free Main # [phone] Fax # [phone] Cell # [phone]  [email or web address]

## 2020-10-29 NOTE — Sedation Documentation (Signed)
Defib at 120J by Dr. Bernette Mayers.
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
## 2020-10-29 NOTE — Patient Outreach (Signed)
[facility]) Care Management  10/29/2020  [name] [birth year] [phone]  Telephone outreach for follow up on progress of applying for community resources.  Mrs. Delprado reports she is not well today. She says she has been vomiting black emesis for 2 days, has not been able to eat, she is in bed and is unassisted. She has never had this problem before and denies hx of GI bleeding.  She has not had a BM in 3 or more days. She denies abdominal pain and bloating.  She is coughing admits to congestion. Denies exposure to COVID 19.  She asks if NP will call her brother to advise of problems and that she needs assistance.  Advised will call Dr. [name] to see if she can be seen.  Called primary care office and Brandi the triage nurse said that pt had called yesterday and left a message and when the staff returned her call, she would not talk to them stating that she was sleeping.  Brandi, transferred me back to the front desk to see if an appt can be scheduled today. Left a message to return my call.  NP is fearful that pt may be headed for an ED visit that could be prevented.  Pt does have transportation problems but has benefit with [employer].  Called pt brother, he will go to see his sister. He definitely feels she cannot take care of herself and needs placement.  Notified by [facility] LCSW she saw that pt was in the ED at [facility].  [facility] team following.  Zara Council. Burgess Estelle, MSN, [facility] Gerontological Nurse Practitioner [facility] Care Management [phone]

## 2020-10-29 NOTE — ED Notes (Addendum)
1700 dose of Zosyn will be given late due to limited IV compatibility. This was passed on to [employer], oncoming night shift RN.

## 2020-10-29 NOTE — ED Provider Notes (Signed)
Patient seen and examined, agree with assessment and plan by APP. Patient with confusion, not taking meds, noted to be hypotensive and tachycardic. EKG shows rapid afib, history of same, talking eliquis. Proceed with emergent cardioversion, patient gives verbal consent.   Physical Exam Constitutional:      Appearance: She is well-developed and well-nourished.  HENT:     Head: Normocephalic and atraumatic.     Mouth/Throat:     Mouth: Mucous membranes are dry.  Cardiovascular:     Rate and Rhythm: Tachycardia present. Rhythm irregular.  Pulmonary:     Effort: Pulmonary effort is normal.  Musculoskeletal:     Cervical back: Neck supple.  Neurological:     Mental Status: She is alert and oriented to person, place, and time.     Cranial Nerves: No cranial nerve deficit.  Psychiatric:        Mood and Affect: Mood and affect normal.        Behavior: Behavior normal.    .Critical Care Performed by: Pollyann Savoy, MD Authorized by: Pollyann Savoy, MD   Critical care provider statement:    Critical care time (minutes):  45   Critical care time was exclusive of:  Separately billable procedures and treating other patients   Critical care was necessary to treat or prevent imminent or life-threatening deterioration of the following conditions:  Circulatory failure and endocrine crisis   Critical care was time spent personally by me on the following activities:  Discussions with consultants, evaluation of patient's response to treatment, examination of patient, ordering and performing treatments and interventions, ordering and review of laboratory studies, ordering and review of radiographic studies, pulse oximetry, re-evaluation of patient's condition, obtaining history from patient or surrogate and review of old charts .Cardioversion  Date/Time: 10/29/2020 2:31 PM Performed by: Pollyann Savoy, MD Authorized by: Pollyann Savoy, MD   Consent:    Consent obtained:  Verbal    Consent given by:  Patient Pre-procedure details:    Cardioversion basis:  Emergent   Rhythm:  Atrial fibrillation   Electrode placement:  Anterior-posterior Patient sedated: Yes. Refer to sedation procedure documentation for details of sedation.  Attempt one:    Cardioversion mode:  Synchronous   Waveform:  Biphasic   Shock (Joules):  120   Cardioversion outcome attempt one: improved rate, still rapid and low BP. Attempt two:    Cardioversion mode:  Synchronous   Waveform:  Biphasic   Shock (Joules):  150   Cardioversion outcome attempt two: sinus tachycardia. Post-procedure details:    Patient status:  Alert   Patient tolerance of procedure:  Tolerated well, no immediate complications .Sedation  Date/Time: 10/29/2020 2:32 PM Performed by: Pollyann Savoy, MD Authorized by: Pollyann Savoy, MD   Consent:    Consent obtained:  Verbal   Consent given by:  Patient   Risks discussed:  Nausea, prolonged hypoxia resulting in organ damage, prolonged sedation necessitating reversal, vomiting and respiratory compromise necessitating ventilatory assistance and intubation   Alternatives discussed:  Analgesia without sedation Universal protocol:    Immediately prior to procedure, a time out was called: yes     Patient identity confirmed:  Verbally with patient Indications:    Procedure performed:  Cardioversion   Procedure necessitating sedation performed by:  Physician performing sedation Pre-sedation assessment:    Time since last food or drink:  24 hours   ASA classification: class 3 - patient with severe systemic disease     Mouth opening:  3 or more finger widths   Thyromental distance:  4 finger widths   Mallampati score:  I - soft palate, uvula, fauces, pillars visible   Neck mobility: normal     Pre-sedation assessments completed and reviewed: airway patency, cardiovascular function, hydration status, mental status, nausea/vomiting, pain level, respiratory function and  temperature     Pre-sedation assessment completed:  10/29/2020 2:00 PM Immediate pre-procedure details:    Reassessment: Patient reassessed immediately prior to procedure     Reviewed: vital signs, relevant labs/tests and NPO status     Verified: bag valve mask available, emergency equipment available, intubation equipment available, IV patency confirmed, oxygen available and suction available   Procedure details (see MAR for exact dosages):    Preoxygenation:  Nasal cannula   Sedation:  Midazolam   Intended level of sedation: moderate (conscious sedation)   Analgesia:  Fentanyl   Intra-procedure monitoring:  Blood pressure monitoring, cardiac monitor, continuous pulse oximetry, frequent LOC assessments, frequent vital sign checks and continuous capnometry   Intra-procedure events: none     Intra-procedure events comment:  Sonorous respirations but no hypoxia   Intra-procedure management:  Supplemental oxygen   Total Provider sedation time (minutes):  10 Post-procedure details:    Post-sedation assessment completed:  10/29/2020 2:34 PM   Attendance: Constant attendance by certified staff until patient recovered     Recovery: Patient returned to pre-procedure baseline     Post-sedation assessments completed and reviewed: airway patency, cardiovascular function, hydration status, mental status, nausea/vomiting, pain level, respiratory function and temperature     Patient is stable for discharge or admission: yes     Procedure completion:  Tolerated well, no immediate complications     Truddie Hidden, MD 10/29/20 1519

## 2020-10-29 NOTE — Progress Notes (Signed)
Pharmacy Antibiotic Note  [name] is a 55 y.o. female admitted on 10/29/2020 with sepsis.  Pharmacy has been consulted for Vancomycin and Zosyn dosing.  Plan: Vancomycin 1500 mg IV x 1 dose. Vancomycin 1000 mg IV every 48 hours. Expected AUC 468. Zosyn 3.375g IV every 8 hours. Monitor labs, c/s, and vanco level as indicated.  Height: 5\' 1"  (154.9 cm) Weight: 78.9 kg (174 lb) IBW/kg (Calculated) : 47.8  No data recorded.  Recent Labs  Lab 10/29/20 1408 10/29/20 1413 10/29/20 1514  WBC 17.1*  --   --   CREATININE 2.47*  --   --   LATICACIDVEN  --  2.3* 1.9    Estimated Creatinine Clearance: 24.2 mL/min (A) (by C-G formula based on SCr of 2.47 mg/dL (H)).    No Known Allergies  Antimicrobials this admission: Vanco 1/4 >>  Zosyn 1/4 >>   Microbiology results: 1/4 BCx: pending 1/4 UCx: pending   Thank you for allowing pharmacy to be a part of this patient's care.  12/27/20 10/29/2020 4:51 PM

## 2020-10-29 NOTE — Sedation Documentation (Signed)
Defib at 150J by Venetia Constable, RN

## 2020-10-29 NOTE — ED Notes (Signed)
CRITICAL VALUE ALERT  Critical Value:  Glucose 874  Date & Time Notied:  10/29/2020 1515  Provider Notified: [name], PA  Orders Received/Actions taken: see chart

## 2020-10-30 ENCOUNTER — Other Ambulatory Visit: Payer: Self-pay

## 2020-10-30 ENCOUNTER — Encounter (HOSPITAL_COMMUNITY): Payer: Self-pay | Admitting: Nephrology

## 2020-10-30 DIAGNOSIS — I4891 Unspecified atrial fibrillation: Secondary | ICD-10-CM | POA: Diagnosis not present

## 2020-10-30 DIAGNOSIS — N179 Acute kidney failure, unspecified: Secondary | ICD-10-CM | POA: Diagnosis not present

## 2020-10-30 DIAGNOSIS — I5042 Chronic combined systolic (congestive) and diastolic (congestive) heart failure: Secondary | ICD-10-CM | POA: Diagnosis not present

## 2020-10-30 DIAGNOSIS — R569 Unspecified convulsions: Secondary | ICD-10-CM

## 2020-10-30 DIAGNOSIS — J440 Chronic obstructive pulmonary disease with acute lower respiratory infection: Secondary | ICD-10-CM

## 2020-10-30 DIAGNOSIS — E1110 Type 2 diabetes mellitus with ketoacidosis without coma: Secondary | ICD-10-CM | POA: Diagnosis not present

## 2020-10-30 LAB — GLUCOSE, CAPILLARY
Glucose-Capillary: 103 mg/dL — ABNORMAL HIGH (ref 70–99)
Glucose-Capillary: 103 mg/dL — ABNORMAL HIGH (ref 70–99)
Glucose-Capillary: 121 mg/dL — ABNORMAL HIGH (ref 70–99)
Glucose-Capillary: 137 mg/dL — ABNORMAL HIGH (ref 70–99)
Glucose-Capillary: 140 mg/dL — ABNORMAL HIGH (ref 70–99)
Glucose-Capillary: 144 mg/dL — ABNORMAL HIGH (ref 70–99)
Glucose-Capillary: 151 mg/dL — ABNORMAL HIGH (ref 70–99)
Glucose-Capillary: 153 mg/dL — ABNORMAL HIGH (ref 70–99)
Glucose-Capillary: 154 mg/dL — ABNORMAL HIGH (ref 70–99)
Glucose-Capillary: 155 mg/dL — ABNORMAL HIGH (ref 70–99)
Glucose-Capillary: 159 mg/dL — ABNORMAL HIGH (ref 70–99)
Glucose-Capillary: 172 mg/dL — ABNORMAL HIGH (ref 70–99)

## 2020-10-30 LAB — BASIC METABOLIC PANEL
Anion gap: 12 (ref 5–15)
Anion gap: 13 (ref 5–15)
BUN: 52 mg/dL — ABNORMAL HIGH (ref 6–20)
BUN: 50 mg/dL — ABNORMAL HIGH (ref 6–20)
CO2: 20 mmol/L — ABNORMAL LOW (ref 22–32)
CO2: 20 mmol/L — ABNORMAL LOW (ref 22–32)
Calcium: 7.8 mg/dL — ABNORMAL LOW (ref 8.9–10.3)
Calcium: 7.7 mg/dL — ABNORMAL LOW (ref 8.9–10.3)
Chloride: 106 mmol/L (ref 98–111)
Chloride: 109 mmol/L (ref 98–111)
Creatinine, Ser: 1.83 mg/dL — ABNORMAL HIGH (ref 0.44–1.00)
Creatinine, Ser: 1.78 mg/dL — ABNORMAL HIGH (ref 0.44–1.00)
GFR, Estimated: 32 mL/min — ABNORMAL LOW (ref >60)
GFR, Estimated: 33 mL/min — ABNORMAL LOW (ref >60)
Glucose, Bld: 181 mg/dL — ABNORMAL HIGH (ref 70–99)
Glucose, Bld: 112 mg/dL — ABNORMAL HIGH (ref 70–99)
Potassium: 4 mmol/L (ref 3.5–5.1)
Potassium: 3.8 mmol/L (ref 3.5–5.1)
Sodium: 138 mmol/L (ref 135–145)
Sodium: 142 mmol/L (ref 135–145)

## 2020-10-30 LAB — HIV ANTIBODY (ROUTINE TESTING W REFLEX): HIV Screen 4th Generation wRfx: NONREACTIVE

## 2020-10-30 LAB — HEMOGLOBIN A1C
Hgb A1c MFr Bld: 13 % — ABNORMAL HIGH (ref 4.8–5.6)
Mean Plasma Glucose: 326.4 mg/dL

## 2020-10-30 LAB — MRSA PCR SCREENING: MRSA by PCR: NEGATIVE

## 2020-10-30 LAB — C DIFFICILE QUICK SCREEN W PCR REFLEX
C Diff antigen: POSITIVE — AB
C Diff interpretation: DETECTED
C Diff toxin: POSITIVE — AB

## 2020-10-30 LAB — BETA-HYDROXYBUTYRIC ACID: Beta-Hydroxybutyric Acid: 0.16 mmol/L (ref 0.05–0.27)

## 2020-10-30 MED ORDER — POTASSIUM CHLORIDE 2 MEQ/ML IV SOLN: INTRAVENOUS | Status: DC

## 2020-10-30 MED ORDER — INSULIN ASPART 100 UNIT/ML ~~LOC~~ SOLN
0.0000 [IU] | Freq: Every day | SUBCUTANEOUS | Status: DC
  Administered 2020-10-31: 3 [IU] via SUBCUTANEOUS
  Administered 2020-11-12: 2 [IU] via SUBCUTANEOUS

## 2020-10-30 MED ORDER — ALBUTEROL SULFATE HFA 108 (90 BASE) MCG/ACT IN AERS: 2.0000 | INHALATION_SPRAY | Freq: Four times a day (QID) | RESPIRATORY_TRACT | Status: DC | PRN

## 2020-10-30 MED ORDER — INSULIN ASPART 100 UNIT/ML ~~LOC~~ SOLN: 4.0000 [IU] | Freq: Three times a day (TID) | SUBCUTANEOUS | Status: DC

## 2020-10-30 MED ORDER — CHLORHEXIDINE GLUCONATE CLOTH 2 % EX PADS
6.0000 | MEDICATED_PAD | Freq: Every day | CUTANEOUS | Status: DC
  Administered 2020-10-30 – 2020-11-03 (×5): 6 via TOPICAL

## 2020-10-30 MED ORDER — INSULIN ASPART 100 UNIT/ML ~~LOC~~ SOLN
0.0000 [IU] | Freq: Three times a day (TID) | SUBCUTANEOUS | Status: DC
  Administered 2020-10-30: 3 [IU] via SUBCUTANEOUS
  Administered 2020-10-30: 2 [IU] via SUBCUTANEOUS
  Administered 2020-10-31: 8 [IU] via SUBCUTANEOUS
  Administered 2020-10-31: 3 [IU] via SUBCUTANEOUS
  Administered 2020-10-31: 5 [IU] via SUBCUTANEOUS
  Administered 2020-11-01: 8 [IU] via SUBCUTANEOUS
  Administered 2020-11-01: 5 [IU] via SUBCUTANEOUS
  Administered 2020-11-02 (×2): 2 [IU] via SUBCUTANEOUS
  Administered 2020-11-03: 3 [IU] via SUBCUTANEOUS
  Administered 2020-11-03: 2 [IU] via SUBCUTANEOUS
  Administered 2020-11-04 – 2020-11-05 (×4): 3 [IU] via SUBCUTANEOUS
  Administered 2020-11-06 (×3): 2 [IU] via SUBCUTANEOUS
  Administered 2020-11-07 (×2): 3 [IU] via SUBCUTANEOUS
  Administered 2020-11-08: 5 [IU] via SUBCUTANEOUS
  Administered 2020-11-08: 8 [IU] via SUBCUTANEOUS
  Administered 2020-11-09: 3 [IU] via SUBCUTANEOUS
  Administered 2020-11-09: 0 [IU] via SUBCUTANEOUS
  Administered 2020-11-09 – 2020-11-10 (×3): 3 [IU] via SUBCUTANEOUS
  Administered 2020-11-10: 2 [IU] via SUBCUTANEOUS
  Administered 2020-11-11: 3 [IU] via SUBCUTANEOUS
  Administered 2020-11-11 – 2020-11-12 (×2): 2 [IU] via SUBCUTANEOUS
  Administered 2020-11-12 – 2020-11-13 (×2): 3 [IU] via SUBCUTANEOUS
  Administered 2020-11-13: 5 [IU] via SUBCUTANEOUS
  Administered 2020-11-14 (×2): 3 [IU] via SUBCUTANEOUS

## 2020-10-30 MED ORDER — INSULIN GLARGINE 100 UNIT/ML ~~LOC~~ SOLN
20.0000 [IU] | SUBCUTANEOUS | Status: DC
  Administered 2020-10-30 – 2020-10-31 (×2): 20 [IU] via SUBCUTANEOUS
  Filled 2020-10-30 (×6): qty 0.2

## 2020-10-30 MED ORDER — UMECLIDINIUM BROMIDE 62.5 MCG/INH IN AEPB: 1.0000 | INHALATION_SPRAY | Freq: Every day | RESPIRATORY_TRACT | Status: DC | PRN

## 2020-10-30 MED ORDER — CLOPIDOGREL BISULFATE 75 MG PO TABS
75.0000 mg | ORAL_TABLET | Freq: Every day | ORAL | Status: DC
  Administered 2020-10-31 – 2020-11-10 (×9): 75 mg via ORAL
  Filled 2020-10-30 (×11): qty 1

## 2020-10-30 MED ORDER — POTASSIUM CHLORIDE IN NACL 20-0.9 MEQ/L-% IV SOLN: INTRAVENOUS | Status: DC

## 2020-10-30 MED ORDER — DULOXETINE HCL 60 MG PO CPEP
60.0000 mg | ORAL_CAPSULE | Freq: Every day | ORAL | Status: DC
  Administered 2020-10-31 – 2020-11-14 (×12): 60 mg via ORAL
  Filled 2020-10-30 (×15): qty 1

## 2020-10-30 MED ORDER — SACCHAROMYCES BOULARDII 250 MG PO CAPS
250.0000 mg | ORAL_CAPSULE | Freq: Two times a day (BID) | ORAL | Status: DC
  Administered 2020-10-31 – 2020-11-14 (×27): 250 mg via ORAL
  Filled 2020-10-30 (×30): qty 1

## 2020-10-30 MED ORDER — MONTELUKAST SODIUM 10 MG PO TABS
10.0000 mg | ORAL_TABLET | Freq: Every day | ORAL | Status: DC
  Administered 2020-10-31 – 2020-11-13 (×15): 10 mg via ORAL
  Filled 2020-10-30 (×15): qty 1

## 2020-10-30 MED ORDER — VANCOMYCIN 50 MG/ML ORAL SOLUTION
125.0000 mg | Freq: Four times a day (QID) | ORAL | Status: AC
  Administered 2020-10-30 – 2020-11-09 (×35): 125 mg via ORAL
  Filled 2020-10-30 (×43): qty 2.5

## 2020-10-30 MED ORDER — GABAPENTIN 300 MG PO CAPS
300.0000 mg | ORAL_CAPSULE | Freq: Three times a day (TID) | ORAL | Status: DC
  Administered 2020-10-31 – 2020-11-14 (×41): 300 mg via ORAL
  Filled 2020-10-30 (×43): qty 1

## 2020-10-30 MED ORDER — HYDRALAZINE HCL 20 MG/ML IJ SOLN
10.0000 mg | INTRAMUSCULAR | Status: DC | PRN
  Administered 2020-10-30 – 2020-11-03 (×2): 10 mg via INTRAVENOUS
  Filled 2020-10-30 (×2): qty 1

## 2020-10-30 MED ORDER — LEVETIRACETAM 500 MG PO TABS
750.0000 mg | ORAL_TABLET | Freq: Two times a day (BID) | ORAL | Status: DC
  Administered 2020-10-31 – 2020-11-14 (×28): 750 mg via ORAL
  Filled 2020-10-30 (×30): qty 1

## 2020-10-30 NOTE — Progress Notes (Signed)
PT Cancellation Note  Patient Details Name: [name] MRN: [id number] DOB: [birth year]   Cancelled Treatment:    Reason Eval/Treat Not Completed: Fatigue/lethargy limiting ability to participate.  Patient declined therapy secondary to c/o of fatigue and having constant diarrhea - RN notified.  Will check back tomorrow.    2:24 PM, 10/30/20 Ocie Bob, MPT Physical Therapist with [facility] [phone] office [phone] mobile phone

## 2020-10-30 NOTE — Progress Notes (Signed)
Lantus given at 1027. Will continue insulin gtt and Q1 finger sticks until 1227, then convert to the sliding scale coverage. Will continue to monitor.

## 2020-10-30 NOTE — Progress Notes (Signed)
CRITICAL VALUE ALERT  Critical Value:  C-DIFF (+)  Date & Time Notied:  10/30/20 @ 1435.   Provider Notified: Laural Benes, MD.  Orders Received/Actions taken: Oral Vanc. Added.

## 2020-10-30 NOTE — Progress Notes (Signed)
PROGRESS NOTE   [name]  XAJ:[id number] DOB: 03-03-1966 DOA: 10/29/2020 PCP: [name], MD   Chief Complaint  Patient presents with  . Hypotension    Brief Admission History:  55 y.o. year-old w/ hx of CVA, seizures, obesity, tobacco abuse, CM EF 25%, HL, hx GIB, HTN, diabetes on insulin, Charcot foot, COPD, cocaine abuse, CKD 3, chronic syst/ diast CHF, anemia, etoh use presented to ED by EMS w/ c/o laying in bed for 2 days w/o medications.  She had reportedly been vomiting and having diarrhea and has not been able to keep down her medication. EMS reported pt's boyfriend left 2 days ago and did not return, we has helping to take care of her and give her her medications. EMS BP 80/60, temp 94.5, HR 160 and CBG 414. In ED RR 31, temp 94.8, HR 145 - 162, RA 97% sat, BP 70/30 >> 150/70 after cardioversion. BS > 600, Na 133 CO2 17  AG 25  Glu 874  BUN 61  Cr 2.47  Ca 8.8  Alb 3.2  LFT's okay, egfr 22  WBC 17K  Hb 16.6  plt 327  Lactic 2.3 > 1.9  Beta-hydroxy ^ 7.16, tsh 0.5   CXR showed LLL atelectasis, no consolidation. EKG showed wide-complex tachycardia at 181 bpm afib w/ RVR, QRS 125 msec. Repeat ekg after cardioversion x 2 (120J, 150J) showed sinus tach at 104 bpm.  In ED she was cardioverted as above and started on IV insulin drip and received IV vanc 1500 mg x 1, IV zosyn also ordered. Got 4 L total bolus IVF"s. Blood and urine cx's sent.  Asked to see for admission.   Pt seen in room, lives alone, uses a walker or wheelchair mostly, needs help caring for herself.  Denies any recent CP or abd pain, +n/v and diarrhea, no fevers or chills.  Vague historian.  Assessment & Plan:   Principal Problem:   DKA (diabetic ketoacidosis) ([place]) Active Problems:   Essential hypertension   Chronic pain syndrome   Chronic combined systolic and diastolic CHF (congestive heart failure) (H[facility])   COPD (chronic obstructive pulmonary disease) (H[facility])   AKI (acute kidney injury) (H[facility])   Atrial  fibrillation with RVR ([place])   Seizures ([place])   Shock ([place])   1. Atrial fibrillation with RVR - pt s/p 2 cardioversions performed in ED by ED staff.  Pt is now in sinus tachycardia, she has been restarted on home oral medications for Afib and anticoagulation.  Following closely.  2. Hypotension - was multifactorial given dehydration from DKA and Afib RVR.  This is much improved now.  3. DKA - Pt presented with acute DKA, treated with IV insulin infusion, now transitioned to subcutaneous insulin.  Follow CBG closely.  4. Debilitation -PT evaluation requested.  5. Chronic combined sys/diastolic heart failure - stable, following.  6. Epilepsy - resume home keppra.  7. COPD - stable.  8. Depression - resume home meds.   DVT prophylaxis: apixaban  Code Status: Full  Family Communication: none present  Disposition:  TBD   Status is: Inpatient  Remains inpatient appropriate because:IV treatments appropriate due to intensity of illness or inability to take PO and Inpatient level of care appropriate due to severity of illness   Dispo: The patient is from: Home              Anticipated d/c is to: Home              Anticipated  d/c date is: 2 days              Patient currently is not medically stable to d/c.  Consultants:     Procedures:    Antimicrobials:  Zosyn   Subjective: Pt reports feeling like she is nauseated.  Otherwise feels weak.   Objective: Vitals:   10/30/20 0800 10/30/20 0807 10/30/20 0900 10/30/20 1000  BP: (!) 176/108  (!) 161/103 (!) 165/103  Pulse: 77  79 83  Resp: 20  (!) 21 (!) 25  Temp:  98.5 F (36.9 C)    TempSrc:  Oral    SpO2: 100%  100% 99%  Weight:      Height:        Intake/Output Summary (Last 24 hours) at 10/30/2020 1052 Last data filed at 10/30/2020 0934 Gross per 24 hour  Intake 2554.7 ml  Output --  Net 2554.7 ml   Filed Weights   10/29/20 1243  Weight: 78.9 kg    Examination:  General exam: awake, alert, NAD, cooperative,  chronically ill appearing, Appears calm and comfortable  Respiratory system: Clear to auscultation. Respiratory effort normal. Cardiovascular system: normal S1 & S2 heard. No JVD, murmurs, rubs, gallops or clicks. No pedal edema. Gastrointestinal system: Abdomen is nondistended, soft and nontender. No organomegaly or masses felt. Normal bowel sounds heard. Central nervous system: Alert and oriented. No focal neurological deficits. Extremities: Symmetric 5 x 5 power. Skin: No rashes, lesions or ulcers Psychiatry:  Mood & affect flat.   Data Reviewed: I have personally reviewed following labs and imaging studies  CBC: Recent Labs  Lab 10/29/20 1408  WBC 17.1*  NEUTROABS 15.7*  HGB 16.6*  HCT 53.9*  MCV 94.9  PLT 130    Basic Metabolic Panel: Recent Labs  Lab 10/29/20 1408 10/29/20 1414 10/29/20 2206 10/30/20 0245 10/30/20 0736  NA 133*  --  140 138 142  K 4.6  --  3.5 4.0 3.8  CL 91*  --  105 106 109  CO2 17*  --  20* 20* 20*  GLUCOSE 874*  --  320* 181* 112*  BUN 61*  --  53* 52* 50*  CREATININE 2.47*  --  1.83* 1.83* 1.78*  CALCIUM 8.8*  --  7.8* 7.8* 7.7*  MG  --  2.2  --   --   --     GFR: Estimated Creatinine Clearance: 33.5 mL/min (A) (by C-G formula based on SCr of 1.78 mg/dL (H)).  Liver Function Tests: Recent Labs  Lab 10/29/20 1408  AST 7*  ALT 18  ALKPHOS 151*  BILITOT 1.3*  PROT 7.5  ALBUMIN 3.2*    CBG: Recent Labs  Lab 10/30/20 0330 10/30/20 0651 10/30/20 0805 10/30/20 0930 10/30/20 1033  GLUCAP 144* 103* 103* 154* 153*    Recent Results (from the past 240 hour(s))  Blood Culture (routine x 2)     Status: None (Preliminary result)   Collection Time: 10/29/20  1:38 PM   Specimen: BLOOD LEFT ARM  Result Value Ref Range Status   Specimen Description BLOOD LEFT ARM  Final   Special Requests   Final    BOTTLES DRAWN AEROBIC AND ANAEROBIC Blood Culture results may not be optimal due to an inadequate volume of blood received in culture  bottles   Culture   Final    NO GROWTH < 24 HOURS Performed at [facility], [address] [place], [place] [address]    Report Status PENDING  Incomplete  Blood Culture (routine  x 2)     Status: None (Preliminary result)   Collection Time: 10/29/20  3:14 PM   Specimen: BLOOD RIGHT HAND  Result Value Ref Range Status   Specimen Description BLOOD RIGHT HAND  Final   Special Requests   Final    BOTTLES DRAWN AEROBIC AND ANAEROBIC Blood Culture results may not be optimal due to an inadequate volume of blood received in culture bottles   Culture   Final    NO GROWTH < 24 HOURS Performed at [facility], [address] [place], [place] [address]    Report Status PENDING  Incomplete  Resp Panel by RT-PCR (Flu A&B, Covid) Nasopharyngeal Swab     Status: None   Collection Time: 10/29/20  5:20 PM   Specimen: Nasopharyngeal Swab; Nasopharyngeal(NP) swabs in vial transport medium  Result Value Ref Range Status   SARS Coronavirus 2 by RT PCR NEGATIVE NEGATIVE Final    Comment: (NOTE) SARS-CoV-2 target nucleic acids are NOT DETECTED.  The SARS-CoV-2 RNA is generally detectable in upper respiratory specimens during the acute phase of infection. The lowest concentration of SARS-CoV-2 viral copies this assay can detect is 138 copies/mL. A negative result does not preclude SARS-Cov-2 infection and should not be used as the sole basis for treatment or other patient management decisions. A negative result may occur with  improper specimen collection/handling, submission of specimen other than nasopharyngeal swab, presence of viral mutation(s) within the areas targeted by this assay, and inadequate number of viral copies(<138 copies/mL). A negative result must be combined with clinical observations, patient history, and epidemiological information. The expected result is Negative.  Fact Sheet for Patients:  EntrepreneurPulse.com.au  Fact Sheet for Healthcare Providers:   IncredibleEmployment.be  This test is no t yet approved or cleared by the [place] FDA and  has been authorized for detection and/or diagnosis of SARS-CoV-2 by FDA under an Emergency Use Authorization (EUA). This EUA will remain  in effect (meaning this test can be used) for the duration of the COVID-19 declaration under Section 564(b)(1) of the Act, 21 U.S.C.section 360bbb-3(b)(1), unless the authorization is terminated  or revoked sooner.       Influenza A by PCR NEGATIVE NEGATIVE Final   Influenza B by PCR NEGATIVE NEGATIVE Final    Comment: (NOTE) The Xpert Xpress SARS-CoV-2/FLU/RSV plus assay is intended as an aid in the diagnosis of influenza from Nasopharyngeal swab specimens and should not be used as a sole basis for treatment. Nasal washings and aspirates are unacceptable for Xpert Xpress SARS-CoV-2/FLU/RSV testing.  Fact Sheet for Patients: EntrepreneurPulse.com.au  Fact Sheet for Healthcare Providers: IncredibleEmployment.be  This test is not yet approved or cleared by the [place] FDA and has been authorized for detection and/or diagnosis of SARS-CoV-2 by FDA under an Emergency Use Authorization (EUA). This EUA will remain in effect (meaning this test can be used) for the duration of the COVID-19 declaration under Section 564(b)(1) of the Act, 21 U.S.C. section 360bbb-3(b)(1), unless the authorization is terminated or revoked.  Performed at [facility], [address] [place], [place] [address]   MRSA PCR Screening     Status: None   Collection Time: 10/30/20 12:17 AM   Specimen: Nasal Mucosa; Nasopharyngeal  Result Value Ref Range Status   MRSA by PCR NEGATIVE NEGATIVE Final    Comment:        The GeneXpert MRSA Assay (FDA approved for NASAL specimens only), is one component of a comprehensive MRSA colonization surveillance program. It is not  intended to diagnose MRSA infection nor to guide  or monitor treatment for MRSA infections. Performed at [facility], [address], [place], [place] [address]      Radiology Studies: CT ABDOMEN PELVIS WO CONTRAST  Result Date: 10/29/2020 CLINICAL DATA:  Nausea vomiting EXAM: CT ABDOMEN AND PELVIS WITHOUT CONTRAST TECHNIQUE: Multidetector CT imaging of the abdomen and pelvis was performed following the standard protocol without IV contrast. COMPARISON:  Radiograph 08/21/2020, CT 07/12/2018 FINDINGS: Lower chest: Lung bases demonstrate small bilateral pleural effusions. Hazy posterior lung bases likely atelectasis. Cardiomegaly. Mild circumferential distal esophageal thickening. Hepatobiliary: No focal liver abnormality is seen. No gallstones, gallbladder wall thickening, or biliary dilatation. Pancreas: Unremarkable. No pancreatic ductal dilatation or surrounding inflammatory changes. Spleen: Normal in size without focal abnormality. Adrenals/Urinary Tract: Adrenal glands are within normal limits. Kidneys show no hydronephrosis. Nonspecific perinephric fat stranding. Markedly distended urinary bladder. Stomach/Bowel: The stomach is nonenlarged. No dilated small bowel. Negative appendix. No acute bowel wall thickening. Vascular/Lymphatic: Moderate aortic atherosclerosis. No aneurysm. No suspicious nodes. Reproductive: Uterus is unremarkable.  No adnexal mass. Other: Negative for free air or free fluid. Musculoskeletal: Moderate superior endplate compression deformity at L1 with about 40% loss of height of anterior vertebral body. Degenerative changes most advanced at L3-L4 and L5-S1. IMPRESSION: 1. No CT evidence for acute intra-abdominal or pelvic abnormality. 2. Mild distal esophageal thickening, question esophagitis or reflux. 3. Small bilateral pleural effusions. Cardiomegaly. 4. Moderate superior endplate compression deformity at L1, of uncertain age, new since 2019. Aortic Atherosclerosis (ICD10-I70.0). Electronically Signed   By: Donavan Foil M.D.    On: 10/29/2020 18:57   DG Chest Port 1 View  Result Date: 10/29/2020 CLINICAL DATA:  Questionable sepsis. EXAM: PORTABLE CHEST 1 VIEW COMPARISON:  August 19, 2020. FINDINGS: Enlarged cardiac silhouette, similar to prior. No confluent consolidation. Streaky left basilar opacity. No visible pleural effusions or pneumothorax on this semi upright portable radiograph. No acute osseous abnormality. IMPRESSION: Streaky left basilar opacity, favored to reflect atelectasis. No confluent consolidation. Electronically Signed   By: Margaretha Sheffield MD   On: 10/29/2020 15:04   Scheduled Meds: . amLODipine  10 mg Oral Daily  . apixaban  5 mg Oral BID  . Chlorhexidine Gluconate Cloth  6 each Topical Daily  . insulin aspart  0-15 Units Subcutaneous TID WC  . insulin aspart  0-5 Units Subcutaneous QHS  . insulin aspart  4 Units Subcutaneous TID WC  . insulin glargine  20 Units Subcutaneous Q24H  . metoprolol succinate  50 mg Oral BID  . rosuvastatin  10 mg Oral Daily   Continuous Infusions: . 0.9 % NaCl with KCl 20 mEq / L 125 mL/hr at 10/30/20 1032  . dextrose 5% lactated ringers 125 mL/hr at 10/29/20 2329  . insulin 2.2 Units/hr (10/30/20 1034)  . lactated ringers Stopped (10/29/20 2329)  . piperacillin-tazobactam (ZOSYN)  IV 3.375 g (10/30/20 0938)     LOS: 1 day   Time spent: 46 mins   Sears Oran Wynetta Emery, MD How to contact the [facility] Attending or Consulting provider [place] or covering provider during after hours [place], for this patient?  1. Check the care team in [facility] and look for a) attending/consulting [facility] provider listed and b) the [facility] team listed 2. Log into [email or web address] and use [place]'s universal password to access. If you do not have the password, please contact the hospital operator. 3. Locate the [facility] provider you are looking for under [facility] and page to a  number that you can be directly reached. 4. If you still have difficulty reaching the provider, please page the [facility]  (Director on Call) for the Hospitalists listed on amion for assistance.  10/30/2020, 10:52 AM

## 2020-10-30 NOTE — Progress Notes (Addendum)
Inpatient Diabetes Program Recommendations  AACE/ADA: New Consensus Statement on Inpatient Glycemic Control   Target Ranges:  Prepandial:   less than 140 mg/dL      Peak postprandial:   less than 180 mg/dL (1-2 hours)      Critically ill patients:  140 - 180 mg/dL   Results for KRYSTOL, ROCCO (MRN [id number]) as of 10/30/2020 07:48  Ref. Range 10/30/2020 00:27 10/30/2020 01:29 10/30/2020 02:29 10/30/2020 03:30 10/30/2020 06:51  Glucose-Capillary Latest Ref Range: 70 - 99 mg/dL 945 (H) 038 (H) 882 (H) 144 (H) 103 (H)  Results for RENNIE, HACK (MRN [id number]) as of 10/30/2020 07:48  Ref. Range 10/29/2020 14:08  Beta-Hydroxybutyric Acid Latest Ref Range: 0.05 - 0.27 mmol/L 7.16 (H)  Glucose Latest Ref Range: 70 - 99 mg/dL 150 [facility])  Results for ALYSAH, CARTON (MRN [id number]) as of 10/30/2020 07:48  Ref. Range 08/14/2020 12:23  Hemoglobin A1C Latest Ref Range: 4.8 - 5.6 % 13.8 (H)   Review of Glycemic Control  Diabetes history: DM2 Outpatient Diabetes medications: Januvia 100 mg daily, Lantus 10 units daily (per home med list not taking Lantus) Current orders for Inpatient glycemic control: IV insulin  Inpatient Diabetes Program Recommendations:    Insulin: Once MD is ready to transition from IV to SQ insulin, please consider ordering Levemir 7 units Q24H, CBGs Q4H, and Novolog 0-9 units Q4H.  Addendum 10/30/20@16 :47- Diabetes coordinator working remotely. Tried to call patient's room phone and cell phone several times today but no answer. Will try to reach out to patient again tomorrow.  Thanks, Orlando Penner, RN, MSN, CDE Diabetes Coordinator Inpatient Diabetes Program [phone] (Team Pager from 8am to 5pm)

## 2020-10-31 DIAGNOSIS — E1110 Type 2 diabetes mellitus with ketoacidosis without coma: Secondary | ICD-10-CM | POA: Diagnosis not present

## 2020-10-31 DIAGNOSIS — N179 Acute kidney failure, unspecified: Secondary | ICD-10-CM | POA: Diagnosis not present

## 2020-10-31 DIAGNOSIS — I4891 Unspecified atrial fibrillation: Secondary | ICD-10-CM | POA: Diagnosis not present

## 2020-10-31 DIAGNOSIS — I5042 Chronic combined systolic (congestive) and diastolic (congestive) heart failure: Secondary | ICD-10-CM | POA: Diagnosis not present

## 2020-10-31 LAB — GLUCOSE, CAPILLARY
Glucose-Capillary: 183 mg/dL — ABNORMAL HIGH (ref 70–99)
Glucose-Capillary: 172 mg/dL — ABNORMAL HIGH (ref 70–99)
Glucose-Capillary: 289 mg/dL — ABNORMAL HIGH (ref 70–99)
Glucose-Capillary: 204 mg/dL — ABNORMAL HIGH (ref 70–99)
Glucose-Capillary: 270 mg/dL — ABNORMAL HIGH (ref 70–99)

## 2020-10-31 LAB — BASIC METABOLIC PANEL
Anion gap: 9 (ref 5–15)
BUN: 38 mg/dL — ABNORMAL HIGH (ref 6–20)
CO2: 20 mmol/L — ABNORMAL LOW (ref 22–32)
Calcium: 7.8 mg/dL — ABNORMAL LOW (ref 8.9–10.3)
Chloride: 113 mmol/L — ABNORMAL HIGH (ref 98–111)
Creatinine, Ser: 1.65 mg/dL — ABNORMAL HIGH (ref 0.44–1.00)
GFR, Estimated: 36 mL/min — ABNORMAL LOW (ref >60)
Glucose, Bld: 198 mg/dL — ABNORMAL HIGH (ref 70–99)
Potassium: 3.9 mmol/L (ref 3.5–5.1)
Sodium: 142 mmol/L (ref 135–145)

## 2020-10-31 LAB — TSH: TSH: 0.371 u[IU]/mL (ref 0.350–4.500)

## 2020-10-31 LAB — MAGNESIUM: Magnesium: 1.5 mg/dL — ABNORMAL LOW (ref 1.7–2.4)

## 2020-10-31 LAB — T4, FREE: Free T4: 0.95 ng/dL (ref 0.61–1.12)

## 2020-10-31 MED ORDER — METOPROLOL TARTRATE 5 MG/5ML IV SOLN
5.0000 mg | Freq: Once | INTRAVENOUS | Status: AC
  Administered 2020-10-31: 5 mg via INTRAVENOUS
  Filled 2020-10-31: qty 5

## 2020-10-31 MED ORDER — ACETAMINOPHEN 325 MG PO TABS
650.0000 mg | ORAL_TABLET | Freq: Four times a day (QID) | ORAL | Status: DC | PRN
  Administered 2020-11-01 – 2020-11-10 (×5): 650 mg via ORAL
  Filled 2020-10-31 (×6): qty 2

## 2020-10-31 MED ORDER — SODIUM CHLORIDE 0.9 % IV BOLUS
500.0000 mL | Freq: Once | INTRAVENOUS | Status: AC
  Administered 2020-10-31: 500 mL via INTRAVENOUS

## 2020-10-31 MED ORDER — ADENOSINE 6 MG/2ML IV SOLN
INTRAVENOUS | Status: AC
  Filled 2020-10-31: qty 2

## 2020-10-31 MED ORDER — MAGNESIUM SULFATE 4 GM/100ML IV SOLN
4.0000 g | Freq: Once | INTRAVENOUS | Status: AC
  Administered 2020-10-31: 4 g via INTRAVENOUS
  Filled 2020-10-31: qty 100

## 2020-10-31 MED ORDER — INSULIN ASPART 100 UNIT/ML ~~LOC~~ SOLN
6.0000 [IU] | Freq: Three times a day (TID) | SUBCUTANEOUS | Status: DC
  Administered 2020-11-01: 6 [IU] via SUBCUTANEOUS

## 2020-10-31 NOTE — Progress Notes (Signed)
PT Cancellation Note  Patient Details Name: [name] MRN: [id number] DOB: 12/26/1965   Cancelled Treatment:    Reason Eval/Treat Not Completed: Patient declined, no reason specified.  Patient refused therapy secondary to c/o pain in bottom after insertion of flexiseal and request pain medication - RN/MD notified.  Will check back tomorrow.   2:50 PM, 10/31/20 Ocie Bob, MPT Physical Therapist with [facility] [phone] office [phone] mobile phone
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
## 2020-10-31 NOTE — Progress Notes (Signed)
Pt had a sustaining HR of 160-165. Dr. Laural Benes made aware. Pt only complaints is that she feels "really sick and crazy like." 12-lead EKG obtained, showed sinus tach. 5mg  Lopressor ordered, as well as all of patient's mornings given. Pt still sustaining in high 140-150's. MD made aware. Stated to continue to monitor and see if am medications kick in. Will continue to monitor.
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
## 2020-10-31 NOTE — Progress Notes (Addendum)
PROGRESS NOTE   [name]  GYF:[id number] DOB: December 17, 1965 DOA: 10/29/2020 PCP: [name], MD   Chief Complaint  Patient presents with  . Hypotension    Brief Admission History:  55 y.o. year-old w/ hx of CVA, seizures, obesity, tobacco abuse, CM EF 25%, HL, hx GIB, HTN, diabetes on insulin, Charcot foot, COPD, cocaine abuse, CKD 3, chronic syst/ diast CHF, anemia, etoh use presented to ED by EMS w/ c/o laying in bed for 2 days w/o medications.  She had reportedly been vomiting and having diarrhea and has not been able to keep down her medication. EMS reported pt's boyfriend left 2 days ago and did not return, we has helping to take care of her and give her her medications. EMS BP 80/60, temp 94.5, HR 160 and CBG 414. In ED RR 31, temp 94.8, HR 145 - 162, RA 97% sat, BP 70/30 >> 150/70 after cardioversion. BS > 600, Na 133 CO2 17  AG 25  Glu 874  BUN 61  Cr 2.47  Ca 8.8  Alb 3.2  LFT's okay, egfr 22  WBC 17K  Hb 16.6  plt 327  Lactic 2.3 > 1.9  Beta-hydroxy ^ 7.16, tsh 0.5   CXR showed LLL atelectasis, no consolidation. EKG showed wide-complex tachycardia at 181 bpm afib w/ RVR, QRS 125 msec. Repeat ekg after cardioversion x 2 (120J, 150J) showed sinus tach at 104 bpm.  In ED she was cardioverted as above and started on IV insulin drip and received IV vanc 1500 mg x 1, IV zosyn also ordered. Got 4 L total bolus IVF"s. Blood and urine cx's sent.  Asked to see for admission.   Pt seen in room, lives alone, uses a walker or wheelchair mostly, needs help caring for herself.  Denies any recent CP or abd pain, +n/v and diarrhea, no fevers or chills.  Vague historian.  Assessment & Plan:   Principal Problem:   DKA (diabetic ketoacidosis) ([place]) Active Problems:   Essential hypertension   Chronic pain syndrome   Chronic combined systolic and diastolic CHF (congestive heart failure) (H[facility])   COPD (chronic obstructive pulmonary disease) (H[facility])   AKI (acute kidney injury) (H[facility])   Atrial  fibrillation with RVR ([place])   Seizures ([place])   Shock ([place])  1. Atrial fibrillation with RVR - pt s/p 2 cardioversions performed in ED by ED staff.  Pt is now in sinus tachycardia, she has been restarted on home oral medications for Afib and anticoagulation.  Following closely. 2. Sinus tachycardia - this morning had a bout of sinus tachy due to missing doses of metoprolol.  Given IV lopressor and resumed oral metoprolol with improvement in heart rates.  Will follow.   3. C diff infection with diarrhea - Pt started on oral vancomycin solution QID x 10 days.  Continue supportive measures.    4. Hypotension - resolved now.  Was multifactorial given dehydration from DKA and Afib RVR.  This is much improved now.  5. DKA - Pt presented with acute DKA, treated with IV insulin infusion, now transitioned to subcutaneous insulin.  Follow CBG closely.  6. Debilitation -PT evaluation requested and recommended SNF placement.  7. Chronic combined sys/diastolic heart failure - stable, following.  8. Epilepsy - resume home keppra.  9. COPD - stable.  10. Depression - resume home meds.   DVT prophylaxis: apixaban  Code Status: Full  Family Communication: none present  Disposition:  Anticipating SNF placement   Status is: Inpatient  Remains inpatient appropriate because:IV treatments appropriate due to intensity of illness or inability to take PO and Inpatient level of care appropriate due to severity of illness   Dispo: The patient is from: Home              Anticipated d/c is to: Home              Anticipated d/c date is: 2 days              Patient currently is not medically stable to d/c.  Consultants:     Procedures:    Antimicrobials:  Zosyn   Subjective: Pt reports feeling like she is nauseated.  Otherwise feels weak.   Objective: Vitals:   10/31/20 1100 10/31/20 1125 10/31/20 1200 10/31/20 1300  BP: 123/78  (!) 136/93 138/90  Pulse: 81  85 81  Resp: (!) 21  (!) 21 18  Temp:  98  F (36.7 C)    TempSrc:  Axillary    SpO2: 100%  100% 97%  Weight:      Height:        Intake/Output Summary (Last 24 hours) at 10/31/2020 1320 Last data filed at 10/31/2020 0636 Gross per 24 hour  Intake 1690.72 ml  Output 300 ml  Net 1390.72 ml   Filed Weights   10/29/20 1243  Weight: 78.9 kg    Examination:  General exam: awake, alert, NAD, cooperative, chronically ill appearing, Appears calm and comfortable  Respiratory system: Clear to auscultation. Respiratory effort normal. Cardiovascular system: normal S1 & S2 heard. No JVD, murmurs, rubs, gallops or clicks. No pedal edema. Gastrointestinal system: Abdomen is nondistended, soft and nontender. No organomegaly or masses felt. Normal bowel sounds heard. Central nervous system: Alert and oriented. No focal neurological deficits. Extremities: Symmetric 5 x 5 power. Skin: No rashes, lesions or ulcers Psychiatry:  Mood & affect flat.   Data Reviewed: I have personally reviewed following labs and imaging studies  CBC: Recent Labs  Lab 10/29/20 1408  WBC 17.1*  NEUTROABS 15.7*  HGB 16.6*  HCT 53.9*  MCV 94.9  PLT 132    Basic Metabolic Panel: Recent Labs  Lab 10/29/20 1408 10/29/20 1414 10/29/20 2206 10/30/20 0245 10/30/20 0736 10/31/20 0548  NA 133*  --  140 138 142 142  K 4.6  --  3.5 4.0 3.8 3.9  CL 91*  --  105 106 109 113*  CO2 17*  --  20* 20* 20* 20*  GLUCOSE 874*  --  320* 181* 112* 198*  BUN 61*  --  53* 52* 50* 38*  CREATININE 2.47*  --  1.83* 1.83* 1.78* 1.65*  CALCIUM 8.8*  --  7.8* 7.8* 7.7* 7.8*  MG  --  2.2  --   --   --  1.5*    GFR: Estimated Creatinine Clearance: 36.2 mL/min (A) (by C-G formula based on SCr of 1.65 mg/dL (H)).  Liver Function Tests: Recent Labs  Lab 10/29/20 1408  AST 7*  ALT 18  ALKPHOS 151*  BILITOT 1.3*  PROT 7.5  ALBUMIN 3.2*    CBG: Recent Labs  Lab 10/30/20 1654 10/30/20 2244 10/31/20 0501 10/31/20 0735 10/31/20 1124  GLUCAP 151* 155* 183* 172*  289*    Recent Results (from the past 240 hour(s))  Blood Culture (routine x 2)     Status: None (Preliminary result)   Collection Time: 10/29/20  1:38 PM   Specimen: BLOOD LEFT ARM  Result Value Ref Range Status  Specimen Description BLOOD LEFT ARM  Final   Special Requests   Final    BOTTLES DRAWN AEROBIC AND ANAEROBIC Blood Culture results may not be optimal due to an inadequate volume of blood received in culture bottles   Culture   Final    NO GROWTH 2 DAYS Performed at [facility], [address], [place], [place] [address]    Report Status PENDING  Incomplete  Blood Culture (routine x 2)     Status: None (Preliminary result)   Collection Time: 10/29/20  3:14 PM   Specimen: BLOOD RIGHT HAND  Result Value Ref Range Status   Specimen Description BLOOD RIGHT HAND  Final   Special Requests   Final    BOTTLES DRAWN AEROBIC AND ANAEROBIC Blood Culture results may not be optimal due to an inadequate volume of blood received in culture bottles   Culture   Final    NO GROWTH 2 DAYS Performed at [facility], [address], [place], [place] [address]    Report Status PENDING  Incomplete  Resp Panel by RT-PCR (Flu A&B, Covid) Nasopharyngeal Swab     Status: None   Collection Time: 10/29/20  5:20 PM   Specimen: Nasopharyngeal Swab; Nasopharyngeal(NP) swabs in vial transport medium  Result Value Ref Range Status   SARS Coronavirus 2 by RT PCR NEGATIVE NEGATIVE Final    Comment: (NOTE) SARS-CoV-2 target nucleic acids are NOT DETECTED.  The SARS-CoV-2 RNA is generally detectable in upper respiratory specimens during the acute phase of infection. The lowest concentration of SARS-CoV-2 viral copies this assay can detect is 138 copies/mL. A negative result does not preclude SARS-Cov-2 infection and should not be used as the sole basis for treatment or other patient management decisions. A negative result may occur with  improper specimen collection/handling, submission of specimen  other than nasopharyngeal swab, presence of viral mutation(s) within the areas targeted by this assay, and inadequate number of viral copies(<138 copies/mL). A negative result must be combined with clinical observations, patient history, and epidemiological information. The expected result is Negative.  Fact Sheet for Patients:  EntrepreneurPulse.com.au  Fact Sheet for Healthcare Providers:  IncredibleEmployment.be  This test is no t yet approved or cleared by the [place] FDA and  has been authorized for detection and/or diagnosis of SARS-CoV-2 by FDA under an Emergency Use Authorization (EUA). This EUA will remain  in effect (meaning this test can be used) for the duration of the COVID-19 declaration under Section 564(b)(1) of the Act, 21 U.S.C.section 360bbb-3(b)(1), unless the authorization is terminated  or revoked sooner.       Influenza A by PCR NEGATIVE NEGATIVE Final   Influenza B by PCR NEGATIVE NEGATIVE Final    Comment: (NOTE) The Xpert Xpress SARS-CoV-2/FLU/RSV plus assay is intended as an aid in the diagnosis of influenza from Nasopharyngeal swab specimens and should not be used as a sole basis for treatment. Nasal washings and aspirates are unacceptable for Xpert Xpress SARS-CoV-2/FLU/RSV testing.  Fact Sheet for Patients: EntrepreneurPulse.com.au  Fact Sheet for Healthcare Providers: IncredibleEmployment.be  This test is not yet approved or cleared by the [place] FDA and has been authorized for detection and/or diagnosis of SARS-CoV-2 by FDA under an Emergency Use Authorization (EUA). This EUA will remain in effect (meaning this test can be used) for the duration of the COVID-19 declaration under Section 564(b)(1) of the Act, 21 U.S.C. section 360bbb-3(b)(1), unless the authorization is terminated or revoked.  Performed at [facility], [address]  [place], [place]  [address]   Urine culture     Status: Abnormal (Preliminary result)   Collection Time: 10/29/20  8:12 PM   Specimen: In/Out Cath Urine  Result Value Ref Range Status   Specimen Description   Final    IN/OUT CATH URINE Performed at [facility], [address], [place], [place] [address]    Special Requests   Final    Immunocompromised Performed at [facility], [address] [place], [place] [address]    Culture (A)  Final    >=100,000 COLONIES/mL STAPHYLOCOCCUS AUREUS SUSCEPTIBILITIES TO FOLLOW Performed at [facility], [place] [address] [place], [place] [address]    Report Status PENDING  Incomplete  MRSA PCR Screening     Status: None   Collection Time: 10/30/20 12:17 AM   Specimen: Nasal Mucosa; Nasopharyngeal  Result Value Ref Range Status   MRSA by PCR NEGATIVE NEGATIVE Final    Comment:        The GeneXpert MRSA Assay (FDA approved for NASAL specimens only), is one component of a comprehensive MRSA colonization surveillance program. It is not intended to diagnose MRSA infection nor to guide or monitor treatment for MRSA infections. Performed at [facility], [address] [place], [place] [address]   C Difficile Quick Screen w PCR reflex     Status: Abnormal   Collection Time: 10/30/20 11:15 AM   Specimen: STOOL  Result Value Ref Range Status   C Diff antigen POSITIVE (A) NEGATIVE Final   C Diff toxin POSITIVE (A) NEGATIVE Final   C Diff interpretation Toxin producing C. difficile detected.  Final    Comment: CRITICAL RESULT CALLED TO, READ BACK BY AND VERIFIED WITH: FOLEY,BRITANY '@[id number]'  10/30/20 BY JONES,T Performed at [facility], [address], [place], [place] [address]      Radiology Studies: CT ABDOMEN PELVIS WO CONTRAST  Result Date: 10/29/2020 CLINICAL DATA:  Nausea vomiting EXAM: CT ABDOMEN AND PELVIS WITHOUT CONTRAST TECHNIQUE: Multidetector CT imaging of the abdomen and pelvis was performed following the standard protocol without IV contrast.  COMPARISON:  Radiograph 08/21/2020, CT 07/12/2018 FINDINGS: Lower chest: Lung bases demonstrate small bilateral pleural effusions. Hazy posterior lung bases likely atelectasis. Cardiomegaly. Mild circumferential distal esophageal thickening. Hepatobiliary: No focal liver abnormality is seen. No gallstones, gallbladder wall thickening, or biliary dilatation. Pancreas: Unremarkable. No pancreatic ductal dilatation or surrounding inflammatory changes. Spleen: Normal in size without focal abnormality. Adrenals/Urinary Tract: Adrenal glands are within normal limits. Kidneys show no hydronephrosis. Nonspecific perinephric fat stranding. Markedly distended urinary bladder. Stomach/Bowel: The stomach is nonenlarged. No dilated small bowel. Negative appendix. No acute bowel wall thickening. Vascular/Lymphatic: Moderate aortic atherosclerosis. No aneurysm. No suspicious nodes. Reproductive: Uterus is unremarkable.  No adnexal mass. Other: Negative for free air or free fluid. Musculoskeletal: Moderate superior endplate compression deformity at L1 with about 40% loss of height of anterior vertebral body. Degenerative changes most advanced at L3-L4 and L5-S1. IMPRESSION: 1. No CT evidence for acute intra-abdominal or pelvic abnormality. 2. Mild distal esophageal thickening, question esophagitis or reflux. 3. Small bilateral pleural effusions. Cardiomegaly. 4. Moderate superior endplate compression deformity at L1, of uncertain age, new since 2019. Aortic Atherosclerosis (ICD10-I70.0). Electronically Signed   By: Donavan Foil M.D.   On: 10/29/2020 18:57   DG Chest Port 1 View  Result Date: 10/29/2020 CLINICAL DATA:  Questionable sepsis. EXAM: PORTABLE CHEST 1 VIEW COMPARISON:  August 19, 2020. FINDINGS: Enlarged cardiac silhouette, similar to prior. No confluent consolidation. Streaky left basilar opacity.  No visible pleural effusions or pneumothorax on this semi upright portable radiograph. No acute osseous abnormality.  IMPRESSION: Streaky left basilar opacity, favored to reflect atelectasis. No confluent consolidation. Electronically Signed   By: Margaretha Sheffield MD   On: 10/29/2020 15:04   Scheduled Meds: . amLODipine  10 mg Oral Daily  . apixaban  5 mg Oral BID  . Chlorhexidine Gluconate Cloth  6 each Topical Daily  . clopidogrel  75 mg Oral Daily  . DULoxetine  60 mg Oral Daily  . gabapentin  300 mg Oral TID  . insulin aspart  0-15 Units Subcutaneous TID WC  . insulin aspart  0-5 Units Subcutaneous QHS  . insulin aspart  6 Units Subcutaneous TID WC  . insulin glargine  20 Units Subcutaneous Q24H  . levETIRAcetam  750 mg Oral BID  . metoprolol succinate  50 mg Oral BID  . montelukast  10 mg Oral QHS  . rosuvastatin  10 mg Oral Daily  . saccharomyces boulardii  250 mg Oral BID  . vancomycin  125 mg Oral QID   Continuous Infusions: . 0.9 % NaCl with KCl 20 mEq / L 145 mL/hr at 10/31/20 0745  . dextrose 5% lactated ringers 125 mL/hr at 10/29/20 2329  . insulin 1 mL/hr at 10/30/20 1519  . lactated ringers Stopped (10/29/20 2329)     LOS: 2 days   Time spent: 31 mins   Mandolin Falwell Wynetta Emery, MD How to contact the [facility] Attending or Consulting provider [place] or covering provider during after hours [place], for this patient?  1. Check the care team in [facility] and look for a) attending/consulting TRH provider listed and b) the [facility] team listed 2. Log into [email or web address] and use [place]'s universal password to access. If you do not have the password, please contact the hospital operator. 3. Locate the [facility] provider you are looking for under [facility] and page to a number that you can be directly reached. 4. If you still have difficulty reaching the provider, please page the [facility] (Director on Call) for the Hospitalists listed on amion for assistance.  10/31/2020, 1:20 PM

## 2020-10-31 NOTE — Progress Notes (Signed)
Inpatient Diabetes Program Recommendations  AACE/ADA: New Consensus Statement on Inpatient Glycemic Control  Target Ranges:  Prepandial:   less than 140 mg/dL      Peak postprandial:   less than 180 mg/dL (1-2 hours)      Critically ill patients:  140 - 180 mg/dL  Results for ARLANA, CANIZALES (MRN [id number]) as of 10/31/2020 13:00  Ref. Range 10/30/2020 08:05 10/30/2020 09:30 10/30/2020 10:33 10/30/2020 11:26 10/30/2020 12:34 10/30/2020 16:54 10/30/2020 22:44 10/31/2020 05:01 10/31/2020 07:35 10/31/2020 11:24  Glucose-Capillary Latest Ref Range: 70 - 99 mg/dL 213 (H) 086 (H) 578 (H) 137 (H) 121 (H) 151 (H) 155 (H) 183 (H) 172 (H) 289 (H)   Results for WILLOW, SHIDLER (MRN [id number]) as of 10/31/2020 13:00  Ref. Range 08/14/2020 12:23 10/29/2020 22:06  Hemoglobin A1C Latest Ref Range: 4.8 - 5.6 % 13.8 (H) 13.0 (H)   Review of Glycemic Control  Diabetes history: DM2 Outpatient Diabetes medications: Januvia 100 mg daily, Lantus 10 units daily (per home med list not taking Lantus) Current orders for Inpatient glycemic control: Lantus 20 units Q24H, Novolog 4 units TID with meals, Novolog 0-15 units TID with meals, Novolog 0-5 units   NOTE: Attempted to call patient's room phone several times today. Communicated with [place], RN to ask that patient's room phone is within reach for patient to be able to answer when called. Tried several more times to call since then but patient is still not answering phone. RN reports that patient is likely to discharge to SNF when appropriate. Patient will mostly likely need to be discharged on insulin regimen.   Thanks, Orlando Penner, RN, MSN, CDE Diabetes Coordinator Inpatient Diabetes Program [phone] (Team Pager from 8am to 5pm)

## 2020-11-01 ENCOUNTER — Encounter: Payer: Self-pay | Admitting: *Deleted

## 2020-11-01 ENCOUNTER — Other Ambulatory Visit: Payer: Self-pay | Admitting: *Deleted

## 2020-11-01 DIAGNOSIS — N179 Acute kidney failure, unspecified: Secondary | ICD-10-CM | POA: Diagnosis not present

## 2020-11-01 DIAGNOSIS — E1110 Type 2 diabetes mellitus with ketoacidosis without coma: Secondary | ICD-10-CM | POA: Diagnosis not present

## 2020-11-01 DIAGNOSIS — I4891 Unspecified atrial fibrillation: Secondary | ICD-10-CM | POA: Diagnosis not present

## 2020-11-01 DIAGNOSIS — I5042 Chronic combined systolic (congestive) and diastolic (congestive) heart failure: Secondary | ICD-10-CM | POA: Diagnosis not present

## 2020-11-01 LAB — BASIC METABOLIC PANEL
Anion gap: 7 (ref 5–15)
BUN: 29 mg/dL — ABNORMAL HIGH (ref 6–20)
CO2: 19 mmol/L — ABNORMAL LOW (ref 22–32)
Calcium: 7.9 mg/dL — ABNORMAL LOW (ref 8.9–10.3)
Chloride: 115 mmol/L — ABNORMAL HIGH (ref 98–111)
Creatinine, Ser: 1.42 mg/dL — ABNORMAL HIGH (ref 0.44–1.00)
GFR, Estimated: 43 mL/min — ABNORMAL LOW (ref >60)
Glucose, Bld: 247 mg/dL — ABNORMAL HIGH (ref 70–99)
Potassium: 4.3 mmol/L (ref 3.5–5.1)
Sodium: 141 mmol/L (ref 135–145)

## 2020-11-01 LAB — URINE CULTURE: Culture: >=100000 — AB

## 2020-11-01 LAB — GLUCOSE, CAPILLARY
Glucose-Capillary: 274 mg/dL — ABNORMAL HIGH (ref 70–99)
Glucose-Capillary: 244 mg/dL — ABNORMAL HIGH (ref 70–99)
Glucose-Capillary: 46 mg/dL — ABNORMAL LOW (ref 70–99)
Glucose-Capillary: 117 mg/dL — ABNORMAL HIGH (ref 70–99)
Glucose-Capillary: 99 mg/dL (ref 70–99)

## 2020-11-01 LAB — MAGNESIUM: Magnesium: 2.3 mg/dL (ref 1.7–2.4)

## 2020-11-01 MED ORDER — INSULIN GLARGINE 100 UNIT/ML ~~LOC~~ SOLN
25.0000 [IU] | SUBCUTANEOUS | Status: DC
  Administered 2020-11-01: 25 [IU] via SUBCUTANEOUS
  Filled 2020-11-01 (×4): qty 0.25

## 2020-11-01 MED ORDER — DEXTROSE 50 % IV SOLN
INTRAVENOUS | Status: AC
  Administered 2020-11-01: 50 mL
  Filled 2020-11-01: qty 50

## 2020-11-01 MED ORDER — INSULIN ASPART 100 UNIT/ML ~~LOC~~ SOLN
8.0000 [IU] | Freq: Three times a day (TID) | SUBCUTANEOUS | Status: DC
  Administered 2020-11-01: 8 [IU] via SUBCUTANEOUS

## 2020-11-01 MED ORDER — INSULIN GLARGINE 100 UNIT/ML ~~LOC~~ SOLN
20.0000 [IU] | SUBCUTANEOUS | Status: DC
  Filled 2020-11-01 (×3): qty 0.2

## 2020-11-01 MED ORDER — INSULIN ASPART 100 UNIT/ML ~~LOC~~ SOLN
5.0000 [IU] | Freq: Three times a day (TID) | SUBCUTANEOUS | Status: DC
  Administered 2020-11-02 (×2): 5 [IU] via SUBCUTANEOUS

## 2020-11-01 MED FILL — Medication: Qty: 1 | Status: AC

## 2020-11-01 NOTE — TOC Progression Note (Signed)
Transition of Care [facility]) - Progression Note   Patient Details  Name: PAMLEA FINDER MRN: [id number] Date of Birth: July 31, 1966  [facility] CM/SW [place], LCSW Phone Number: 11/01/2020, 3:41 PM  Clinical Narrative: Patient agreeable to bed offer from [facility]. CSW called Bernadene Bell to start insurance authorization with start date of 11/03/20. Reference ID # is: [id number]. Clinicals faxed to [facility]. TOC awaiting insurance authorization.  Expected Discharge Plan: [place] Barriers to Discharge: Continued Medical Work up  Expected Discharge Plan and Services Expected Discharge Plan: [place] In-house Referral: Clinical Social Work Post Acute Care Choice: [place] Living arrangements for the past 2 months: Single Family Home  Readmission Risk Interventions Readmission Risk Prevention Plan 10/31/2020 08/27/2020  Transportation Screening Complete Complete  Medication Review Press photographer) Complete Complete  PCP or Specialist appointment within 3-5 days of discharge - Complete  HRI or [place] - Complete  SW Recovery Care/Counseling Consult Complete Complete  Palliative Care Screening Not Applicable Not Applicable  Skilled Nursing Facility Complete Complete  Some recent data might be hidden

## 2020-11-01 NOTE — Care Management Important Message (Signed)
Important Message  Patient Details  Name: [name] MRN: [id number] Date of Birth: [birth year]   Medicare Important Message Given:  Yes - Important Message mailed due to current National Emergency     Tommy Medal 11/01/2020, 3:50 PM

## 2020-11-01 NOTE — Plan of Care (Signed)
  Problem: Acute Rehab PT Goals(only PT should resolve) Goal: Pt Will Go Supine/Side To Sit Outcome: Progressing Flowsheets (Taken 11/01/2020 1425) Pt will go Supine/Side to Sit:  with minimal assist  with moderate assist Goal: Patient Will Transfer Sit To/From Stand Outcome: Progressing Flowsheets (Taken 11/01/2020 1425) Patient will transfer sit to/from stand:  with minimal assist  with moderate assist Goal: Pt Will Transfer Bed To Chair/Chair To Bed Outcome: Progressing Flowsheets (Taken 11/01/2020 1425) Pt will Transfer Bed to Chair/Chair to Bed: with mod assist Goal: Pt Will Ambulate Outcome: Progressing Flowsheets (Taken 11/01/2020 1425) Pt will Ambulate:  15 feet  with moderate assist  with rolling walker   2:25 PM, 11/01/20 Lonell Grandchild, MPT Physical Therapist with [facility] [phone] office [phone] mobile phone

## 2020-11-01 NOTE — Telephone Encounter (Signed)
Pt currently admitted- will be d/c to SNF

## 2020-11-01 NOTE — Progress Notes (Signed)
PROGRESS NOTE   [name]  DDU:[id number] DOB: 14-Feb-1966 DOA: 10/29/2020 PCP: [name], MD   Chief Complaint  Patient presents with  . Hypotension    Brief Admission History:  55 y.o. year-old w/ hx of CVA, seizures, obesity, tobacco abuse, CM EF 25%, HL, hx GIB, HTN, diabetes on insulin, Charcot foot, COPD, cocaine abuse, CKD 3, chronic syst/ diast CHF, anemia, etoh use presented to ED by EMS w/ c/o laying in bed for 2 days w/o medications.  She had reportedly been vomiting and having diarrhea and has not been able to keep down her medication. EMS reported pt's boyfriend left 2 days ago and did not return, we has helping to take care of her and give her her medications. EMS BP 80/60, temp 94.5, HR 160 and CBG 414. In ED RR 31, temp 94.8, HR 145 - 162, RA 97% sat, BP 70/30 >> 150/70 after cardioversion. BS > 600, Na 133 CO2 17  AG 25  Glu 874  BUN 61  Cr 2.47  Ca 8.8  Alb 3.2  LFT's okay, egfr 22  WBC 17K  Hb 16.6  plt 327  Lactic 2.3 > 1.9  Beta-hydroxy ^ 7.16, tsh 0.5   CXR showed LLL atelectasis, no consolidation. EKG showed wide-complex tachycardia at 181 bpm afib w/ RVR, QRS 125 msec. Repeat ekg after cardioversion x 2 (120J, 150J) showed sinus tach at 104 bpm.  In ED she was cardioverted as above and started on IV insulin drip and received IV vanc 1500 mg x 1, IV zosyn also ordered. Got 4 L total bolus IVF"s. Blood and urine cx's sent.  Asked to see for admission.   Pt seen in room, lives alone, uses a walker or wheelchair mostly, needs help caring for herself.  Denies any recent CP or abd pain, +n/v and diarrhea, no fevers or chills.  Vague historian.  Assessment & Plan:   Principal Problem:   DKA (diabetic ketoacidosis) ([place]) Active Problems:   Essential hypertension   Chronic pain syndrome   Chronic combined systolic and diastolic CHF (congestive heart failure) (HCC)   COPD (chronic obstructive pulmonary disease) (HCC)   AKI (acute kidney injury) (HCC)   Atrial  fibrillation with RVR ([place])   Seizures ([place])   Shock ([place])  1. Atrial fibrillation with RVR - pt s/p 2 cardioversions performed in ED by ED staff.  Pt is now in sinus tachycardia, she has been restarted on home oral medications for Afib and anticoagulation.  Following closely. 2. Sinus tachycardia - resolved now.   3. C diff infection with diarrhea - Pt started on oral vancomycin solution QID x 10 days.  Continue supportive measures.    4. Hypotension - resolved now.  Was multifactorial given dehydration from DKA and Afib RVR.  This is much improved now.  5. DKA - Pt presented with acute DKA, treated with IV insulin infusion, now transitioned to subcutaneous insulin.  Follow CBG closely.  Titrating doses for better glycemic control.  6. Debilitation -PT evaluation requested and recommended SNF placement.  Pt had been refusing to participate with PT.   7. Chronic combined sys/diastolic heart failure - stable, following.  8. Epilepsy - resume home keppra.  9. COPD - stable.  10. Depression - resume home meds.   DVT prophylaxis: apixaban  Code Status: Full  Family Communication: none present  Disposition:  Anticipating SNF placement   Status is: Inpatient  Remains inpatient appropriate because:IV treatments appropriate due to intensity of illness  or inability to take PO and Inpatient level of care appropriate due to severity of illness  Dispo: The patient is from: Home              Anticipated d/c is to: Home              Anticipated d/c date is: 2 days              Patient currently is not medically stable to d/c.  Consultants:     Procedures:    Antimicrobials:  Vancomycin 1/6>>   Subjective: Pt remains very poor appetite, diarrhea slowing some, pt has been refusing to participate with PT   Objective: Vitals:   11/01/20 0000 11/01/20 0100 11/01/20 0754 11/01/20 1200  BP: (!) 151/103 (!) 150/97    Pulse: 79 81    Resp: 18 19    Temp:   98.7 F (37.1 C) (!) 97.4 F (36.3  C)  TempSrc:   Oral Oral  SpO2: 100% 100%    Weight:      Height:        Intake/Output Summary (Last 24 hours) at 11/01/2020 1416 Last data filed at 11/01/2020 0137 Gross per 24 hour  Intake 34.3 ml  Output 1 ml  Net 33.3 ml   Filed Weights   10/29/20 1243  Weight: 78.9 kg    Examination:  General exam: awake, alert, NAD, cooperative, chronically ill appearing, Appears calm and comfortable  Respiratory system: Clear to auscultation. Respiratory effort normal. Cardiovascular system: normal S1 & S2 heard. No JVD, murmurs, rubs, gallops or clicks. No pedal edema. Gastrointestinal system: Abdomen is nondistended, soft and nontender. No organomegaly or masses felt. Normal bowel sounds heard. Central nervous system: Alert and oriented. No focal neurological deficits. Extremities: Symmetric 5 x 5 power. Skin: No rashes, lesions or ulcers Psychiatry:  Mood & affect flat.   Data Reviewed: I have personally reviewed following labs and imaging studies  CBC: Recent Labs  Lab 10/29/20 1408  WBC 17.1*  NEUTROABS 15.7*  HGB 16.6*  HCT 53.9*  MCV 94.9  PLT 998    Basic Metabolic Panel: Recent Labs  Lab 10/29/20 1414 10/29/20 2206 10/30/20 0245 10/30/20 0736 10/31/20 0548 11/01/20 0400  NA  --  140 138 142 142 141  K  --  3.5 4.0 3.8 3.9 4.3  CL  --  105 106 109 113* 115*  CO2  --  20* 20* 20* 20* 19*  GLUCOSE  --  320* 181* 112* 198* 247*  BUN  --  53* 52* 50* 38* 29*  CREATININE  --  1.83* 1.83* 1.78* 1.65* 1.42*  CALCIUM  --  7.8* 7.8* 7.7* 7.8* 7.9*  MG 2.2  --   --   --  1.5* 2.3    GFR: Estimated Creatinine Clearance: 42 mL/min (A) (by C-G formula based on SCr of 1.42 mg/dL (H)).  Liver Function Tests: Recent Labs  Lab 10/29/20 1408  AST 7*  ALT 18  ALKPHOS 151*  BILITOT 1.3*  PROT 7.5  ALBUMIN 3.2*    CBG: Recent Labs  Lab 10/31/20 1124 10/31/20 1606 10/31/20 2121 11/01/20 0756 11/01/20 1203  GLUCAP 289* 204* 270* 274* 244*    Recent  Results (from the past 240 hour(s))  Blood Culture (routine x 2)     Status: None (Preliminary result)   Collection Time: 10/29/20  1:38 PM   Specimen: BLOOD LEFT ARM  Result Value Ref Range Status   Specimen Description BLOOD LEFT ARM  Final   Special Requests   Final    BOTTLES DRAWN AEROBIC AND ANAEROBIC Blood Culture results may not be optimal due to an inadequate volume of blood received in culture bottles   Culture   Final    NO GROWTH 3 DAYS Performed at [facility], [address], [place], [place] [address]    Report Status PENDING  Incomplete  Blood Culture (routine x 2)     Status: None (Preliminary result)   Collection Time: 10/29/20  3:14 PM   Specimen: BLOOD RIGHT HAND  Result Value Ref Range Status   Specimen Description BLOOD RIGHT HAND  Final   Special Requests   Final    BOTTLES DRAWN AEROBIC AND ANAEROBIC Blood Culture results may not be optimal due to an inadequate volume of blood received in culture bottles   Culture   Final    NO GROWTH 3 DAYS Performed at [facility], [address] [place], [place] [address]    Report Status PENDING  Incomplete  Resp Panel by RT-PCR (Flu A&B, Covid) Nasopharyngeal Swab     Status: None   Collection Time: 10/29/20  5:20 PM   Specimen: Nasopharyngeal Swab; Nasopharyngeal(NP) swabs in vial transport medium  Result Value Ref Range Status   SARS Coronavirus 2 by RT PCR NEGATIVE NEGATIVE Final    Comment: (NOTE) SARS-CoV-2 target nucleic acids are NOT DETECTED.  The SARS-CoV-2 RNA is generally detectable in upper respiratory specimens during the acute phase of infection. The lowest concentration of SARS-CoV-2 viral copies this assay can detect is 138 copies/mL. A negative result does not preclude SARS-Cov-2 infection and should not be used as the sole basis for treatment or other patient management decisions. A negative result may occur with  improper specimen collection/handling, submission of specimen other than  nasopharyngeal swab, presence of viral mutation(s) within the areas targeted by this assay, and inadequate number of viral copies(<138 copies/mL). A negative result must be combined with clinical observations, patient history, and epidemiological information. The expected result is Negative.  Fact Sheet for Patients:  EntrepreneurPulse.com.au  Fact Sheet for Healthcare Providers:  IncredibleEmployment.be  This test is no t yet approved or cleared by the [place] FDA and  has been authorized for detection and/or diagnosis of SARS-CoV-2 by FDA under an Emergency Use Authorization (EUA). This EUA will remain  in effect (meaning this test can be used) for the duration of the COVID-19 declaration under Section 564(b)(1) of the Act, 21 U.S.C.section 360bbb-3(b)(1), unless the authorization is terminated  or revoked sooner.       Influenza A by PCR NEGATIVE NEGATIVE Final   Influenza B by PCR NEGATIVE NEGATIVE Final    Comment: (NOTE) The Xpert Xpress SARS-CoV-2/FLU/RSV plus assay is intended as an aid in the diagnosis of influenza from Nasopharyngeal swab specimens and should not be used as a sole basis for treatment. Nasal washings and aspirates are unacceptable for Xpert Xpress SARS-CoV-2/FLU/RSV testing.  Fact Sheet for Patients: EntrepreneurPulse.com.au  Fact Sheet for Healthcare Providers: IncredibleEmployment.be  This test is not yet approved or cleared by the [place] FDA and has been authorized for detection and/or diagnosis of SARS-CoV-2 by FDA under an Emergency Use Authorization (EUA). This EUA will remain in effect (meaning this test can be used) for the duration of the COVID-19 declaration under Section 564(b)(1) of the Act, 21 U.S.C. section 360bbb-3(b)(1), unless the authorization is terminated or revoked.  Performed at [facility], [address], [place], [place] [address]   Urine  culture     Status: Abnormal   Collection Time: 10/29/20  8:12 PM   Specimen: In/Out Cath Urine  Result Value Ref Range Status   Specimen Description   Final    IN/OUT CATH URINE Performed at [facility], [address] [place], [place] [address]    Special Requests   Final    Immunocompromised Performed at [facility], [address] [place], [place] [address]    Culture >=100,000 COLONIES/mL STAPHYLOCOCCUS AUREUS (A)  Final   Report Status 11/01/2020 FINAL  Final   Organism ID, Bacteria STAPHYLOCOCCUS AUREUS (A)  Final      Susceptibility   Staphylococcus aureus - MIC*    CIPROFLOXACIN <=0.5 SENSITIVE Sensitive     GENTAMICIN <=0.5 SENSITIVE Sensitive     NITROFURANTOIN <=16 SENSITIVE Sensitive     OXACILLIN <=0.25 SENSITIVE Sensitive     TETRACYCLINE <=1 SENSITIVE Sensitive     VANCOMYCIN 1 SENSITIVE Sensitive     TRIMETH/SULFA <=10 SENSITIVE Sensitive     CLINDAMYCIN <=0.25 SENSITIVE Sensitive     RIFAMPIN <=0.5 SENSITIVE Sensitive     Inducible Clindamycin NEGATIVE Sensitive     * >=100,000 COLONIES/mL STAPHYLOCOCCUS AUREUS  MRSA PCR Screening     Status: None   Collection Time: 10/30/20 12:17 AM   Specimen: Nasal Mucosa; Nasopharyngeal  Result Value Ref Range Status   MRSA by PCR NEGATIVE NEGATIVE Final    Comment:        The GeneXpert MRSA Assay (FDA approved for NASAL specimens only), is one component of a comprehensive MRSA colonization surveillance program. It is not intended to diagnose MRSA infection nor to guide or monitor treatment for MRSA infections. Performed at [facility], [address], [place], [place] [address]   C Difficile Quick Screen w PCR reflex     Status: Abnormal   Collection Time: 10/30/20 11:15 AM   Specimen: STOOL  Result Value Ref Range Status   C Diff antigen POSITIVE (A) NEGATIVE Final   C Diff toxin POSITIVE (A) NEGATIVE Final   C Diff interpretation Toxin producing C. difficile detected.  Final    Comment: CRITICAL RESULT  CALLED TO, READ BACK BY AND VERIFIED WITH: FOLEY,BRITANY _0  10/30/20 BY JONES,T Performed at [facility], [address] [place],  [address]      Radiology Studies: No results found. Scheduled Meds: . amLODipine  10 mg Oral Daily  . apixaban  5 mg Oral BID  . Chlorhexidine Gluconate Cloth  6 each Topical Daily  . clopidogrel  75 mg Oral Daily  . DULoxetine  60 mg Oral Daily  . gabapentin  300 mg Oral TID  . insulin aspart  0-15 Units Subcutaneous TID WC  . insulin aspart  0-5 Units Subcutaneous QHS  . insulin aspart  8 Units Subcutaneous TID WC  . insulin glargine  25 Units Subcutaneous Q24H  . levETIRAcetam  750 mg Oral BID  . metoprolol succinate  50 mg Oral BID  . montelukast  10 mg Oral QHS  . rosuvastatin  10 mg Oral Daily  . saccharomyces boulardii  250 mg Oral BID  . vancomycin  125 mg Oral QID   Continuous Infusions: . 0.9 % NaCl with KCl 20 mEq / L 125 mL/hr at 10/31/20 1450     LOS: 3 days   Time spent: 37 mins    Wynetta Emery, MD How to contact the [facility] Attending or Consulting provider [place] or covering provider during after hours [place], for this  patient?  1. Check the care team in [facility] and look for a) attending/consulting [facility] provider listed and b) the [facility] team listed 2. Log into [email or web address] and use [place]'s universal password to access. If you do not have the password, please contact the hospital operator. 3. Locate the [facility] provider you are looking for under [facility] and page to a number that you can be directly reached. 4. If you still have difficulty reaching the provider, please page the [facility] (Director on Call) for the Hospitalists listed on amion for assistance.  11/01/2020, 2:16 PM

## 2020-11-01 NOTE — Evaluation (Signed)
Physical Therapy Evaluation Patient Details Name: DILYNN MUNROE MRN: [id number] DOB: [birth year] Today's Date: 11/01/2020   History of Present Illness  HPI: The patient is a 55 y.o. year-old w/ hx of CVA, seizures, obesity, tobacco abuse, CM EF 25%, HL, hx GIB, HTN, diabetes on insulin, Charcot foot, COPD, cocaine abuse, CKD 3, chronic syst/ diast CHF, anemia, etoh use presented to ED by EMS w/ c/o laying in bed for 2 days w/o medications.  She had reportedly been vomiting and having diarrhea and has not been able to keep down her medication. EMS reported pt's boyfriend left 2 days ago and did not return, we has helping to take care of her and give her her medications. EMS BP 80/60, temp 94.5, HR 160 and CBG 414. In ED RR 31, temp 94.8, HR 145 - 162, RA 97% sat, BP 70/30 >> 150/70 after cardioversion. BS > 600, Na 133 CO2 17  AG 25  Glu 874  BUN 61  Cr 2.47  Ca 8.8  Alb 3.2  LFT's okay, egfr 22  WBC 17K  Hb 16.6  plt 327  Lactic 2.3 > 1.9  Beta-hydroxy ^ 7.16, tsh 0.5   CXR showed LLL atelectasis, no consolidation. EKG showed wide-complex tachycardia at 181 bpm afib w/ RVR, QRS 125 msec. Repeat ekg after cardioversion x 2 (120J, 150J) showed sinus tach at 104 bpm.  In ED she was cardioverted as above and started on IV insulin drip and received IV vanc 1500 mg x 1, IV zosyn also ordered. Got 4 L total bolus IVF"s. Blood and urine cx's sent.  Asked to see for admission.    Clinical Impression  Patient demonstrates slow labored movement for sitting up at bedside with difficulty using BUE requiring Mod/max assistance, limited to standing at bedside with RW and unable to take steps secondary to fall risk and generalized weakness.  Patient required Mod/max assist to reposition when put back to bed.  Patient will benefit from continued physical therapy in hospital and recommended venue below to increase strength, balance, endurance for safe ADLs and gait.     Follow Up Recommendations SNF    Equipment  Recommendations  None recommended by PT    Recommendations for Other Services       Precautions / Restrictions Precautions Precautions: Fall Restrictions Weight Bearing Restrictions: No      Mobility  Bed Mobility Overal bed mobility: Needs Assistance Bed Mobility: Supine to Sit;Sit to Supine     Supine to sit: Mod assist Sit to supine: Mod assist   General bed mobility comments: slow labored movement    Transfers Overall transfer level: Needs assistance Equipment used: Rolling walker (2 wheeled) Transfers: Sit to/from Stand Sit to Stand: Mod assist;Max assist         General transfer comment: very unsteady on feet with buckling of knees  Ambulation/Gait                Stairs            Wheelchair Mobility    Modified Rankin (Stroke Patients Only)       Balance Overall balance assessment: Needs assistance Sitting-balance support: Feet supported;No upper extremity supported Sitting balance-Leahy Scale: Fair Sitting balance - Comments: seated at EOB   Standing balance support: During functional activity;Bilateral upper extremity supported Standing balance-Leahy Scale: Poor Standing balance comment: using RW  Pertinent Vitals/Pain Pain Assessment: Faces Faces Pain Scale: Hurts a little bit Pain Location: over bottom Pain Descriptors / Indicators: Sore Pain Intervention(s): Limited activity within patient's tolerance;Monitored during session;Repositioned    Home Living Family/patient expects to be discharged to:: Private residence Living Arrangements: Alone Available Help at Discharge: Family;Available PRN/intermittently Type of Home: House Home Access: Ramped entrance     Home Layout: One level Home Equipment: [place] - 4 wheels;Shower seat;Bedside commode;Wheelchair - manual      Prior Function Level of Independence: Needs assistance   Gait / Transfers Assistance Needed: household ambulator  using RW  ADL's / Homemaking Assistance Needed: assisted by family        Hand Dominance   Dominant Hand: Right    Extremity/Trunk Assessment   Upper Extremity Assessment Upper Extremity Assessment: Generalized weakness    Lower Extremity Assessment Lower Extremity Assessment: Generalized weakness    Cervical / Trunk Assessment Cervical / Trunk Assessment: Normal  Communication      Cognition Arousal/Alertness: Awake/alert Behavior During Therapy: WFL for tasks assessed/performed Overall Cognitive Status: Within Functional Limits for tasks assessed                                        General Comments      Exercises     Assessment/Plan    PT Assessment Patient needs continued PT services  PT Problem List Decreased strength;Decreased activity tolerance;Decreased balance;Decreased mobility       PT Treatment Interventions DME instruction;Gait training;Stair training;Functional mobility training;Therapeutic activities;Therapeutic exercise;Patient/family education;Balance training    PT Goals (Current goals can be found in the Care Plan section)  Acute Rehab PT Goals Patient Stated Goal: return home after rehab PT Goal Formulation: With patient Time For Goal Achievement: 11/15/20 Potential to Achieve Goals: Good    Frequency Min 3X/week   Barriers to discharge        Co-evaluation               AM-PAC PT "6 Clicks" Mobility  Outcome Measure Help needed turning from your back to your side while in a flat bed without using bedrails?: A Lot Help needed moving from lying on your back to sitting on the side of a flat bed without using bedrails?: A Lot Help needed moving to and from a bed to a chair (including a wheelchair)?: A Lot Help needed standing up from a chair using your arms (e.g., wheelchair or bedside chair)?: A Lot Help needed to walk in hospital room?: Total Help needed climbing 3-5 steps with a railing? : Total 6 Click  Score: 10    End of Session   Activity Tolerance: Patient tolerated treatment well;Patient limited by fatigue Patient left: in bed;with call bell/phone within reach Nurse Communication: Mobility status PT Visit Diagnosis: Other abnormalities of gait and mobility (R26.89);Unsteadiness on feet (R26.81);Muscle weakness (generalized) (M62.81)    Time: 2409-7353 PT Time Calculation (min) (ACUTE ONLY): 20 min   Charges:   PT Evaluation $PT Eval Moderate Complexity: 1 Mod PT Treatments $Therapeutic Activity: 8-22 mins        2:24 PM, 11/01/20 Lonell Grandchild, MPT Physical Therapist with [facility] [phone] office [phone] mobile phone

## 2020-11-01 NOTE — Patient Outreach (Signed)
[facility]) Care Management  11/01/2020  NILDA KEATHLEY [birth year] [id number]  After thorough review of patient's electronic medical record in [place], [place] noted that patient is agreeable to skilled nursing placement, once medically stable and ready for discharge from the hospital.  Patient has chosen to reside at [facility] to receive her short-term rehabilitative services, and will be transported via non-emergency ambulance transport, through [facility]  Patient's sister, [name] admitted to being in full agreement with patient's discharge plan of care.  CSW will perform a case closure on patient, as all goals of treatment have been met from social work standpoint and no additional social work needs have been identified at this time.  CSW will notify patient's Geriatric Nurse Practitioner with [facility], [name] of CSW's plans to close patient's case.  CSW will fax an update to patient's Primary Care Physician, Dr. Tula Nakayama to ensure that she is aware of CSW's involvement with patient's plan of care, in addition to sending a Physician Case Closure Letter to Dr. Griffin Dakin office.    Nat Christen, BSW, MSW, LCSW  Licensed Education officer, [facility]  Mailing [place] [address], [place], [place] [address] Physical Address-[address], [place], [place] [address] Toll Free Main # [phone] Fax # [phone] Cell # [phone]  [email or web address]

## 2020-11-02 ENCOUNTER — Inpatient Hospital Stay (HOSPITAL_COMMUNITY): Payer: Medicare Other

## 2020-11-02 DIAGNOSIS — E1110 Type 2 diabetes mellitus with ketoacidosis without coma: Secondary | ICD-10-CM | POA: Diagnosis not present

## 2020-11-02 DIAGNOSIS — I5042 Chronic combined systolic (congestive) and diastolic (congestive) heart failure: Secondary | ICD-10-CM | POA: Diagnosis not present

## 2020-11-02 DIAGNOSIS — I4891 Unspecified atrial fibrillation: Secondary | ICD-10-CM | POA: Diagnosis not present

## 2020-11-02 DIAGNOSIS — R079 Chest pain, unspecified: Secondary | ICD-10-CM

## 2020-11-02 DIAGNOSIS — N179 Acute kidney failure, unspecified: Secondary | ICD-10-CM | POA: Diagnosis not present

## 2020-11-02 LAB — GLUCOSE, CAPILLARY
Glucose-Capillary: 168 mg/dL — ABNORMAL HIGH (ref 70–99)
Glucose-Capillary: 145 mg/dL — ABNORMAL HIGH (ref 70–99)
Glucose-Capillary: 142 mg/dL — ABNORMAL HIGH (ref 70–99)
Glucose-Capillary: 106 mg/dL — ABNORMAL HIGH (ref 70–99)
Glucose-Capillary: 74 mg/dL (ref 70–99)

## 2020-11-02 MED ORDER — INSULIN GLARGINE 100 UNIT/ML ~~LOC~~ SOLN
15.0000 [IU] | SUBCUTANEOUS | Status: DC
  Administered 2020-11-02: 15 [IU] via SUBCUTANEOUS
  Filled 2020-11-02 (×3): qty 0.15

## 2020-11-02 MED ORDER — PANTOPRAZOLE SODIUM 40 MG PO TBEC
40.0000 mg | DELAYED_RELEASE_TABLET | Freq: Two times a day (BID) | ORAL | Status: DC
  Administered 2020-11-02 – 2020-11-10 (×15): 40 mg via ORAL
  Filled 2020-11-02 (×17): qty 1

## 2020-11-02 NOTE — Progress Notes (Signed)
PROGRESS NOTE   [name]  OVP:[id number] DOB: 1966-10-16 DOA: 10/29/2020 PCP: [name], MD   Chief Complaint  Patient presents with  . Hypotension    Brief Admission History:  55 y.o. year-old w/ hx of CVA, seizures, obesity, tobacco abuse, CM EF 25%, HL, hx GIB, HTN, diabetes on insulin, Charcot foot, COPD, cocaine abuse, CKD 3, chronic syst/ diast CHF, anemia, etoh use presented to ED by EMS w/ c/o laying in bed for 2 days w/o medications.  She had reportedly been vomiting and having diarrhea and has not been able to keep down her medication. EMS reported pt's boyfriend left 2 days ago and did not return, we has helping to take care of her and give her her medications. EMS BP 80/60, temp 94.5, HR 160 and CBG 414. In ED RR 31, temp 94.8, HR 145 - 162, RA 97% sat, BP 70/30 >> 150/70 after cardioversion. BS > 600, Na 133 CO2 17  AG 25  Glu 874  BUN 61  Cr 2.47  Ca 8.8  Alb 3.2  LFT's okay, egfr 22  WBC 17K  Hb 16.6  plt 327  Lactic 2.3 > 1.9  Beta-hydroxy ^ 7.16, tsh 0.5   CXR showed LLL atelectasis, no consolidation. EKG showed wide-complex tachycardia at 181 bpm afib w/ RVR, QRS 125 msec. Repeat ekg after cardioversion x 2 (120J, 150J) showed sinus tach at 104 bpm.  In ED she was cardioverted as above and started on IV insulin drip and received IV vanc 1500 mg x 1, IV zosyn also ordered. Got 4 L total bolus IVF"s. Blood and urine cx's sent.  Asked to see for admission.   Pt seen in room, lives alone, uses a walker or wheelchair mostly, needs help caring for herself.  Denies any recent CP or abd pain, +n/v and diarrhea, no fevers or chills.  Vague historian.  Assessment & Plan:   Principal Problem:   DKA (diabetic ketoacidosis) ([place]) Active Problems:   Essential hypertension   Chronic pain syndrome   Chronic combined systolic and diastolic CHF (congestive heart failure) (HCC)   COPD (chronic obstructive pulmonary disease) (HCC)   AKI (acute kidney injury) (HCC)   Atrial  fibrillation with RVR ([place])   Seizures ([place])   Shock ([place])  1. Atrial fibrillation with RVR - pt s/p 2 cardioversions performed in ED by ED staff.  Pt is now in sinus tachycardia, she has been restarted on home oral medications for Afib and anticoagulation.  Following closely.  Continue home metoprolol.   2. C diff infection with diarrhea - Pt started on oral vancomycin solution QID x 10 days.  Continue supportive measures.  Awaiting for isolation bed at SNF.  3. Hypotension - resolved now.  Was multifactorial given dehydration from DKA and Afib RVR.  This is much improved now.  4. DKA - RESOLVED.  Pt presented with acute DKA, treated with IV insulin infusion, now transitioned to subcutaneous insulin.  Follow CBG closely.  Titrating doses for better glycemic control.  5. Hypoglycemia - from insulin.  Poor oral intake, reduced prandial doses and basal dose.  6. Debilitation -PT recommended SNF placement.    7. Chronic combined sys/diastolic heart failure - stable, following.  8. Epilepsy - resume home keppra.  9. COPD - stable.  10. Depression - resume home meds.   DVT prophylaxis: apixaban  Code Status: Full  Family Communication: none present  Disposition:  SNF placement  (awaiting isolation bed at SNF)  Status  is: Inpatient  Remains inpatient appropriate because:IV treatments appropriate due to intensity of illness or inability to take PO and Inpatient level of care appropriate due to severity of illness  Dispo: The patient is from: Home              Anticipated d/c is to: Home              Anticipated d/c date is: 2 days              Patient currently is medically stable to d/c.  Awaiting for SNF to have an isolation bed (due to patient having C diff infection).   Consultants:     Procedures:    Antimicrobials:  Vancomycin 1/6>>   Subjective: Pt not eating well. Having indigestion symptoms. Diarrhea slowing down.    Objective: Vitals:   11/01/20 1600 11/01/20 2018  11/02/20 0015 11/02/20 0326  BP: (!) 137/95 (!) 142/97 135/88 (!) 148/104  Pulse: 75 72 75 77  Resp: '16 18 16 16  ' Temp: 98.3 F (36.8 C) (!) 97.5 F (36.4 C) 98.1 F (36.7 C) 98.3 F (36.8 C)  TempSrc: Oral Oral Oral Oral  SpO2: 100% 100% 100% 100%  Weight:      Height:        Intake/Output Summary (Last 24 hours) at 11/02/2020 1623 Last data filed at 11/02/2020 0500 Gross per 24 hour  Intake 1450 ml  Output 800 ml  Net 650 ml   Filed Weights   10/29/20 1243  Weight: 78.9 kg    Examination:  General exam: awake, alert, NAD, cooperative, chronically ill appearing, Appears calm and comfortable  Respiratory system: Clear to auscultation. Respiratory effort normal. Cardiovascular system: normal S1 & S2 heard. No JVD, murmurs, rubs, gallops or clicks. No pedal edema. Gastrointestinal system: Abdomen is nondistended, soft and nontender. No organomegaly or masses felt. Normal bowel sounds heard. Central nervous system: Alert and oriented. No focal neurological deficits. Extremities: Symmetric 5 x 5 power. Skin: No rashes, lesions or ulcers Psychiatry:  Mood & affect flat.   Data Reviewed: I have personally reviewed following labs and imaging studies  CBC: Recent Labs  Lab 10/29/20 1408  WBC 17.1*  NEUTROABS 15.7*  HGB 16.6*  HCT 53.9*  MCV 94.9  PLT 081    Basic Metabolic Panel: Recent Labs  Lab 10/29/20 1414 10/29/20 2206 10/30/20 0245 10/30/20 0736 10/31/20 0548 11/01/20 0400  NA  --  140 138 142 142 141  K  --  3.5 4.0 3.8 3.9 4.3  CL  --  105 106 109 113* 115*  CO2  --  20* 20* 20* 20* 19*  GLUCOSE  --  320* 181* 112* 198* 247*  BUN  --  53* 52* 50* 38* 29*  CREATININE  --  1.83* 1.83* 1.78* 1.65* 1.42*  CALCIUM  --  7.8* 7.8* 7.7* 7.8* 7.9*  MG 2.2  --   --   --  1.5* 2.3    GFR: Estimated Creatinine Clearance: 42 mL/min (A) (by C-G formula based on SCr of 1.42 mg/dL (H)).  Liver Function Tests: Recent Labs  Lab 10/29/20 1408  AST 7*  ALT 18   ALKPHOS 151*  BILITOT 1.3*  PROT 7.5  ALBUMIN 3.2*    CBG: Recent Labs  Lab 11/01/20 1747 11/01/20 2123 11/02/20 0300 11/02/20 0749 11/02/20 1107  GLUCAP 117* 99 168* 145* 142*    Recent Results (from the past 240 hour(s))  Blood Culture (routine x 2)  Status: None (Preliminary result)   Collection Time: 10/29/20  1:38 PM   Specimen: BLOOD LEFT ARM  Result Value Ref Range Status   Specimen Description BLOOD LEFT ARM  Final   Special Requests   Final    BOTTLES DRAWN AEROBIC AND ANAEROBIC Blood Culture results may not be optimal due to an inadequate volume of blood received in culture bottles   Culture   Final    NO GROWTH 4 DAYS Performed at [facility], [address], [place], [place] [address]    Report Status PENDING  Incomplete  Blood Culture (routine x 2)     Status: None (Preliminary result)   Collection Time: 10/29/20  3:14 PM   Specimen: BLOOD RIGHT HAND  Result Value Ref Range Status   Specimen Description BLOOD RIGHT HAND  Final   Special Requests   Final    BOTTLES DRAWN AEROBIC AND ANAEROBIC Blood Culture results may not be optimal due to an inadequate volume of blood received in culture bottles   Culture   Final    NO GROWTH 4 DAYS Performed at [facility], [address], [place], [place] [address]    Report Status PENDING  Incomplete  Resp Panel by RT-PCR (Flu A&B, Covid) Nasopharyngeal Swab     Status: None   Collection Time: 10/29/20  5:20 PM   Specimen: Nasopharyngeal Swab; Nasopharyngeal(NP) swabs in vial transport medium  Result Value Ref Range Status   SARS Coronavirus 2 by RT PCR NEGATIVE NEGATIVE Final    Comment: (NOTE) SARS-CoV-2 target nucleic acids are NOT DETECTED.  The SARS-CoV-2 RNA is generally detectable in upper respiratory specimens during the acute phase of infection. The lowest concentration of SARS-CoV-2 viral copies this assay can detect is 138 copies/mL. A negative result does not preclude SARS-Cov-2 infection and  should not be used as the sole basis for treatment or other patient management decisions. A negative result may occur with  improper specimen collection/handling, submission of specimen other than nasopharyngeal swab, presence of viral mutation(s) within the areas targeted by this assay, and inadequate number of viral copies(<138 copies/mL). A negative result must be combined with clinical observations, patient history, and epidemiological information. The expected result is Negative.  Fact Sheet for Patients:  EntrepreneurPulse.com.au  Fact Sheet for Healthcare Providers:  IncredibleEmployment.be  This test is no t yet approved or cleared by the [place] FDA and  has been authorized for detection and/or diagnosis of SARS-CoV-2 by FDA under an Emergency Use Authorization (EUA). This EUA will remain  in effect (meaning this test can be used) for the duration of the COVID-19 declaration under Section 564(b)(1) of the Act, 21 U.S.C.section 360bbb-3(b)(1), unless the authorization is terminated  or revoked sooner.       Influenza A by PCR NEGATIVE NEGATIVE Final   Influenza B by PCR NEGATIVE NEGATIVE Final    Comment: (NOTE) The Xpert Xpress SARS-CoV-2/FLU/RSV plus assay is intended as an aid in the diagnosis of influenza from Nasopharyngeal swab specimens and should not be used as a sole basis for treatment. Nasal washings and aspirates are unacceptable for Xpert Xpress SARS-CoV-2/FLU/RSV testing.  Fact Sheet for Patients: EntrepreneurPulse.com.au  Fact Sheet for Healthcare Providers: IncredibleEmployment.be  This test is not yet approved or cleared by the [place] FDA and has been authorized for detection and/or diagnosis of SARS-CoV-2 by FDA under an Emergency Use Authorization (EUA). This EUA will remain in effect (meaning this test can be used) for the duration of the COVID-19 declaration  under Section 564(b)(1) of the Act, 21 U.S.C. section 360bbb-3(b)(1), unless the authorization is terminated or revoked.  Performed at [facility], [address] [place], [place] [address]   Urine culture     Status: Abnormal   Collection Time: 10/29/20  8:12 PM   Specimen: In/Out Cath Urine  Result Value Ref Range Status   Specimen Description   Final    IN/OUT CATH URINE Performed at [facility], [address] [place], [place] [address]    Special Requests   Final    Immunocompromised Performed at [facility], [address] [place], [place] [address]    Culture >=100,000 COLONIES/mL STAPHYLOCOCCUS AUREUS (A)  Final   Report Status 11/01/2020 FINAL  Final   Organism ID, Bacteria STAPHYLOCOCCUS AUREUS (A)  Final      Susceptibility   Staphylococcus aureus - MIC*    CIPROFLOXACIN <=0.5 SENSITIVE Sensitive     GENTAMICIN <=0.5 SENSITIVE Sensitive     NITROFURANTOIN <=16 SENSITIVE Sensitive     OXACILLIN <=0.25 SENSITIVE Sensitive     TETRACYCLINE <=1 SENSITIVE Sensitive     VANCOMYCIN 1 SENSITIVE Sensitive     TRIMETH/SULFA <=10 SENSITIVE Sensitive     CLINDAMYCIN <=0.25 SENSITIVE Sensitive     RIFAMPIN <=0.5 SENSITIVE Sensitive     Inducible Clindamycin NEGATIVE Sensitive     * >=100,000 COLONIES/mL STAPHYLOCOCCUS AUREUS  MRSA PCR Screening     Status: None   Collection Time: 10/30/20 12:17 AM   Specimen: Nasal Mucosa; Nasopharyngeal  Result Value Ref Range Status   MRSA by PCR NEGATIVE NEGATIVE Final    Comment:        The GeneXpert MRSA Assay (FDA approved for NASAL specimens only), is one component of a comprehensive MRSA colonization surveillance program. It is not intended to diagnose MRSA infection nor to guide or monitor treatment for MRSA infections. Performed at [facility], [address], [place], [place] [address]   C Difficile Quick Screen w PCR reflex     Status: Abnormal   Collection Time: 10/30/20 11:15 AM   Specimen: STOOL  Result Value Ref  Range Status   C Diff antigen POSITIVE (A) NEGATIVE Final   C Diff toxin POSITIVE (A) NEGATIVE Final   C Diff interpretation Toxin producing C. difficile detected.  Final    Comment: CRITICAL RESULT CALLED TO, READ BACK BY AND VERIFIED WITH: FOLEY,BRITANY '@[id number]'  10/30/20 BY JONES,T Performed at [facility], [address] [place],  [address]      Radiology Studies: DG CHEST PORT 1 VIEW  Result Date: 11/02/2020 CLINICAL DATA:  Chest pain when swallowing water EXAM: PORTABLE CHEST 1 VIEW COMPARISON:  10/29/2020 FINDINGS: Cardiomegaly and aortic tortuosity. Stable lung markings. There is no edema, consolidation, effusion, or pneumothorax. IMPRESSION: 1. No acute finding when compared to prior. 2. Cardiomegaly. Electronically Signed   By: Monte Fantasia M.D.   On: 11/02/2020 04:36   Scheduled Meds: . amLODipine  10 mg Oral Daily  . apixaban  5 mg Oral BID  . Chlorhexidine Gluconate Cloth  6 each Topical Daily  . clopidogrel  75 mg Oral Daily  . DULoxetine  60 mg Oral Daily  . gabapentin  300 mg Oral TID  . insulin aspart  0-15 Units Subcutaneous TID WC  . insulin aspart  0-5 Units Subcutaneous QHS  . insulin aspart  5 Units Subcutaneous TID WC  . insulin glargine  15 Units Subcutaneous Q24H  . levETIRAcetam  750 mg Oral BID  . metoprolol succinate  50 mg Oral BID  . montelukast  10 mg Oral QHS  . pantoprazole  40 mg Oral BID  . rosuvastatin  10 mg Oral Daily  . saccharomyces boulardii  250 mg Oral BID  . vancomycin  125 mg Oral QID   Continuous Infusions: . 0.9 % NaCl with KCl 20 mEq / L 125 mL/hr at 10/31/20 1450     LOS: 4 days   Time spent: 35 mins   Raunel Dimartino Wynetta Emery, MD How to contact the [facility] Attending or Consulting provider [place] or covering provider during after hours [place], for this patient?  1. Check the care team in [facility] and look for a) attending/consulting TRH provider listed and b) the [facility] team listed 2. Log into [email or web address] and use [place]'s universal  password to access. If you do not have the password, please contact the hospital operator. 3. Locate the [facility] provider you are looking for under [facility] and page to a number that you can be directly reached. 4. If you still have difficulty reaching the provider, please page the [facility] (Director on Call) for the Hospitalists listed on amion for assistance.  11/02/2020, 4:23 PM

## 2020-11-02 NOTE — Progress Notes (Signed)
Patient refused phlebotomist to draw lab work.

## 2020-11-02 NOTE — Progress Notes (Addendum)
Night coverage note: Informed by RN that pt complaining of sudden onset of central chest pain.  No radiation, no N/V.  BP 148/104.  Getting EKG, trop, CXR stat.  Addendum: pt seen and evaluated at bedside.  On further history patient reports that CP is currently resolved (0/10).  She has been having intermittent chest pain "only when I swallow" for the past 2 days it seems.  Ruling out ACS as above, however ACS sounds quite unlikely.  Sounds more likely to be an esophageal issue in which case will defer work up and management to day team.

## 2020-11-02 NOTE — Progress Notes (Addendum)
Called into patient's room with patient stating that she is having chest pain. Writer asked patient to rate and describe chest pain. Pt stated her chest pain was a 10/10 and she was choking. Writer asked is there something stuck in her throat.  Pt stated, 'No my chest is hurting."Pt stated that she has been choking all day. Pt ate a snack and had PO fluids at bedside and ate/drink without difficulty. Pt also took PO HS medication without difficulty and didn't state anything about trouble swallowing or choking. Denies nausea or vomiting. VSS. MD made aware via amion chat. MD provided new orders per chart.

## 2020-11-02 NOTE — TOC Progression Note (Signed)
Transition of Care [facility]) - Progression Note    Patient Details  Name: ETHELINE GEPPERT MRN: [id number] Date of Birth: 07-Mar-1966  Transition of Care [facility]) CM/SW Contact  Natasha Bence, [place] Phone Number: 11/02/2020, 12:09 PM  Clinical Narrative:    MD notified CSW that patient would be medically stable, but has C.Diff. CSW contacted [facility] to inquire if patient would be able to discharge to SNF on 01/08 with the C.Diff diagnosis. Claiborne Billings with [facility] reported that they do not have any isolation beds available today, but may have a bed Monday or Tuesday. [place] [phone]. TOC to follow.   Expected Discharge Plan: [place] Barriers to Discharge: Continued Medical Work up  Expected Discharge Plan and Services Expected Discharge Plan: [place] In-house Referral: Clinical Social Work   Post Acute Care Choice: [place] Living arrangements for the past 2 months: Single Family Home                                       Social Determinants of Health (SDOH) Interventions    Readmission Risk Interventions Readmission Risk Prevention Plan 10/31/2020 08/27/2020  Transportation Screening Complete Complete  Medication Review Press photographer) Complete Complete  PCP or Specialist appointment within 3-5 days of discharge - Complete  HRI or [place] - Complete  SW Recovery Care/Counseling Consult Complete Complete  Palliative Care Screening Not Applicable Not Applicable  Skilled Nursing Facility Complete Complete  Some recent data might be hidden

## 2020-11-03 DIAGNOSIS — N179 Acute kidney failure, unspecified: Secondary | ICD-10-CM | POA: Diagnosis not present

## 2020-11-03 DIAGNOSIS — I5042 Chronic combined systolic (congestive) and diastolic (congestive) heart failure: Secondary | ICD-10-CM | POA: Diagnosis not present

## 2020-11-03 DIAGNOSIS — E1110 Type 2 diabetes mellitus with ketoacidosis without coma: Secondary | ICD-10-CM | POA: Diagnosis not present

## 2020-11-03 DIAGNOSIS — I4891 Unspecified atrial fibrillation: Secondary | ICD-10-CM | POA: Diagnosis not present

## 2020-11-03 LAB — CULTURE, BLOOD (ROUTINE X 2)
Culture: NO GROWTH
Culture: NO GROWTH

## 2020-11-03 LAB — GLUCOSE, CAPILLARY
Glucose-Capillary: 66 mg/dL — ABNORMAL LOW (ref 70–99)
Glucose-Capillary: 81 mg/dL (ref 70–99)
Glucose-Capillary: 136 mg/dL — ABNORMAL HIGH (ref 70–99)
Glucose-Capillary: 171 mg/dL — ABNORMAL HIGH (ref 70–99)
Glucose-Capillary: 59 mg/dL — ABNORMAL LOW (ref 70–99)
Glucose-Capillary: 72 mg/dL (ref 70–99)

## 2020-11-03 MED ORDER — INSULIN ASPART 100 UNIT/ML ~~LOC~~ SOLN
4.0000 [IU] | Freq: Three times a day (TID) | SUBCUTANEOUS | Status: DC
  Administered 2020-11-03 (×2): 4 [IU] via SUBCUTANEOUS

## 2020-11-03 MED ORDER — FLUCONAZOLE 150 MG PO TABS
150.0000 mg | ORAL_TABLET | Freq: Once | ORAL | Status: AC
  Administered 2020-11-03: 150 mg via ORAL
  Filled 2020-11-03: qty 1

## 2020-11-03 MED ORDER — INSULIN GLARGINE 100 UNIT/ML ~~LOC~~ SOLN
12.0000 [IU] | SUBCUTANEOUS | Status: DC
  Administered 2020-11-03: 12 [IU] via SUBCUTANEOUS
  Filled 2020-11-03 (×2): qty 0.12

## 2020-11-03 MED ORDER — GUAIFENESIN ER 600 MG PO TB12
1200.0000 mg | ORAL_TABLET | Freq: Two times a day (BID) | ORAL | Status: DC
  Administered 2020-11-03 – 2020-11-14 (×20): 1200 mg via ORAL
  Filled 2020-11-03 (×23): qty 2

## 2020-11-03 NOTE — Progress Notes (Signed)
PROGRESS NOTE   [name]  IHK:[id number] DOB: [birth year] DOA: 10/29/2020 PCP: [name], MD   Chief Complaint  Patient presents with  . Hypotension    Brief Admission History:  55 y.o. year-old w/ hx of CVA, seizures, obesity, tobacco abuse, CM EF 25%, HL, hx GIB, HTN, diabetes on insulin, Charcot foot, COPD, cocaine abuse, CKD 3, chronic syst/ diast CHF, anemia, etoh use presented to ED by EMS w/ c/o laying in bed for 2 days w/o medications.  She had reportedly been vomiting and having diarrhea and has not been able to keep down her medication. EMS reported pt's boyfriend left 2 days ago and did not return, we has helping to take care of her and give her her medications. EMS BP 80/60, temp 94.5, HR 160 and CBG 414. In ED RR 31, temp 94.8, HR 145 - 162, RA 97% sat, BP 70/30 >> 150/70 after cardioversion. BS > 600, Na 133 CO2 17  AG 25  Glu 874  BUN 61  Cr 2.47  Ca 8.8  Alb 3.2  LFT's okay, egfr 22  WBC 17K  Hb 16.6  plt 327  Lactic 2.3 > 1.9  Beta-hydroxy ^ 7.16, tsh 0.5   CXR showed LLL atelectasis, no consolidation. EKG showed wide-complex tachycardia at 181 bpm afib w/ RVR, QRS 125 msec. Repeat ekg after cardioversion x 2 (120J, 150J) showed sinus tach at 104 bpm.  In ED she was cardioverted as above and started on IV insulin drip and received IV vanc 1500 mg x 1, IV zosyn also ordered. Got 4 L total bolus IVF"s. Blood and urine cx's sent.  Asked to see for admission.   Pt seen in room, lives alone, uses a walker or wheelchair mostly, needs help caring for herself.  Denies any recent CP or abd pain, +n/v and diarrhea, no fevers or chills.  Vague historian.  Assessment & Plan:   Principal Problem:   DKA (diabetic ketoacidosis) ([place]) Active Problems:   Essential hypertension   Chronic pain syndrome   Chronic combined systolic and diastolic CHF (congestive heart failure) (HCC)   COPD (chronic obstructive pulmonary disease) (HCC)   AKI (acute kidney injury) (HCC)   Atrial  fibrillation with RVR ([place])   Seizures ([place])   Shock ([place])  1. Atrial fibrillation with RVR - pt s/p 2 cardioversions performed in ED by ED staff.  Pt is now in sinus tachycardia, she has been restarted on home oral medications for Afib and anticoagulation.  Following closely.  Continue home metoprolol.   2. C diff infection with diarrhea - Pt started on oral vancomycin solution QID x 10 days.  Continue supportive measures.  Awaiting for isolation bed at SNF.  3. Hypotension - resolved now.  Was multifactorial given dehydration from DKA and Afib RVR.  This is much improved now.  4. DKA - RESOLVED.  Pt presented with acute DKA, treated with IV insulin infusion, now transitioned to subcutaneous insulin.  Follow CBG closely.  Titrating doses for better glycemic control.  5. Hypoglycemia - from insulin.  Poor oral intake, reduced prandial doses and basal dose.  6. Debilitation -PT recommended SNF placement.    7. Chronic combined sys/diastolic heart failure - stable, following.  8. Epilepsy - resumed home keppra.  9. COPD - stable.  10. Depression - resume home meds.  11. Yeast vaginitis - fluconazole ordered.   DVT prophylaxis: apixaban  Code Status: Full  Family Communication: none present  Disposition:  SNF placement  (  awaiting isolation bed at SNF)  Status is: Inpatient  Remains inpatient appropriate because:IV treatments appropriate due to intensity of illness or inability to take PO and Inpatient level of care appropriate due to severity of illness  Dispo: The patient is from: Home              Anticipated d/c is to: Home              Anticipated d/c date is: 1 day              Patient currently is medically stable to d/c.  Awaiting for SNF to have an isolation bed (due to patient having C diff infection).   Consultants:     Procedures:    Antimicrobials:  Vancomycin 1/6>>   Subjective: Pt c/o vaginal itching.     Objective: Vitals:   11/02/20 2028 11/02/20 2136  11/03/20 0308 11/03/20 0536  BP:  (!) 146/109 (!) 150/103 127/87  Pulse:  75  80  Resp:  '16 18 18  ' Temp:  98.8 F (37.1 C) 97.7 F (36.5 C) 98.5 F (36.9 C)  TempSrc:  Oral Oral Oral  SpO2: 100% 100%  99%  Weight:      Height:        Intake/Output Summary (Last 24 hours) at 11/03/2020 1309 Last data filed at 11/03/2020 0900 Gross per 24 hour  Intake 970 ml  Output -  Net 970 ml   Filed Weights   10/29/20 1243  Weight: 78.9 kg    Examination:  General exam: awake, alert, NAD, cooperative, chronically ill appearing, Appears calm and comfortable  Respiratory system: Clear to auscultation. Respiratory effort normal. Cardiovascular system: normal S1 & S2 heard. No JVD, murmurs, rubs, gallops or clicks. No pedal edema. Gastrointestinal system: Abdomen is nondistended, soft and nontender. No organomegaly or masses felt. Normal bowel sounds heard. Central nervous system: Alert and oriented. No focal neurological deficits. Extremities: Symmetric 5 x 5 power. Skin: No rashes, lesions or ulcers Psychiatry:  Mood & affect flat.   Data Reviewed: I have personally reviewed following labs and imaging studies  CBC: Recent Labs  Lab 10/29/20 1408  WBC 17.1*  NEUTROABS 15.7*  HGB 16.6*  HCT 53.9*  MCV 94.9  PLT 500    Basic Metabolic Panel: Recent Labs  Lab 10/29/20 1414 10/29/20 2206 10/30/20 0245 10/30/20 0736 10/31/20 0548 11/01/20 0400  NA  --  140 138 142 142 141  K  --  3.5 4.0 3.8 3.9 4.3  CL  --  105 106 109 113* 115*  CO2  --  20* 20* 20* 20* 19*  GLUCOSE  --  320* 181* 112* 198* 247*  BUN  --  53* 52* 50* 38* 29*  CREATININE  --  1.83* 1.83* 1.78* 1.65* 1.42*  CALCIUM  --  7.8* 7.8* 7.7* 7.8* 7.9*  MG 2.2  --   --   --  1.5* 2.3    GFR: Estimated Creatinine Clearance: 42 mL/min (A) (by C-G formula based on SCr of 1.42 mg/dL (H)).  Liver Function Tests: Recent Labs  Lab 10/29/20 1408  AST 7*  ALT 18  ALKPHOS 151*  BILITOT 1.3*  PROT 7.5  ALBUMIN  3.2*    CBG: Recent Labs  Lab 11/02/20 2139 11/03/20 0310 11/03/20 0354 11/03/20 0737 11/03/20 1114  GLUCAP 74 66* 81 136* 171*    Recent Results (from the past 240 hour(s))  Blood Culture (routine x 2)     Status: None  Collection Time: 10/29/20  1:38 PM   Specimen: BLOOD LEFT ARM  Result Value Ref Range Status   Specimen Description BLOOD LEFT ARM  Final   Special Requests   Final    BOTTLES DRAWN AEROBIC AND ANAEROBIC Blood Culture results may not be optimal due to an inadequate volume of blood received in culture bottles   Culture   Final    NO GROWTH 5 DAYS Performed at [facility], [address], [place], [place] [address]    Report Status 11/03/2020 FINAL  Final  Blood Culture (routine x 2)     Status: None   Collection Time: 10/29/20  3:14 PM   Specimen: BLOOD RIGHT HAND  Result Value Ref Range Status   Specimen Description BLOOD RIGHT HAND  Final   Special Requests   Final    BOTTLES DRAWN AEROBIC AND ANAEROBIC Blood Culture results may not be optimal due to an inadequate volume of blood received in culture bottles   Culture   Final    NO GROWTH 5 DAYS Performed at [facility], [address], [place], [place] [address]    Report Status 11/03/2020 FINAL  Final  Resp Panel by RT-PCR (Flu A&B, Covid) Nasopharyngeal Swab     Status: None   Collection Time: 10/29/20  5:20 PM   Specimen: Nasopharyngeal Swab; Nasopharyngeal(NP) swabs in vial transport medium  Result Value Ref Range Status   SARS Coronavirus 2 by RT PCR NEGATIVE NEGATIVE Final    Comment: (NOTE) SARS-CoV-2 target nucleic acids are NOT DETECTED.  The SARS-CoV-2 RNA is generally detectable in upper respiratory specimens during the acute phase of infection. The lowest concentration of SARS-CoV-2 viral copies this assay can detect is 138 copies/mL. A negative result does not preclude SARS-Cov-2 infection and should not be used as the sole basis for treatment or other patient management decisions. A  negative result may occur with  improper specimen collection/handling, submission of specimen other than nasopharyngeal swab, presence of viral mutation(s) within the areas targeted by this assay, and inadequate number of viral copies(<138 copies/mL). A negative result must be combined with clinical observations, patient history, and epidemiological information. The expected result is Negative.  Fact Sheet for Patients:  EntrepreneurPulse.com.au  Fact Sheet for Healthcare Providers:  IncredibleEmployment.be  This test is no t yet approved or cleared by the [place] FDA and  has been authorized for detection and/or diagnosis of SARS-CoV-2 by FDA under an Emergency Use Authorization (EUA). This EUA will remain  in effect (meaning this test can be used) for the duration of the COVID-19 declaration under Section 564(b)(1) of the Act, 21 U.S.C.section 360bbb-3(b)(1), unless the authorization is terminated  or revoked sooner.       Influenza A by PCR NEGATIVE NEGATIVE Final   Influenza B by PCR NEGATIVE NEGATIVE Final    Comment: (NOTE) The Xpert Xpress SARS-CoV-2/FLU/RSV plus assay is intended as an aid in the diagnosis of influenza from Nasopharyngeal swab specimens and should not be used as a sole basis for treatment. Nasal washings and aspirates are unacceptable for Xpert Xpress SARS-CoV-2/FLU/RSV testing.  Fact Sheet for Patients: EntrepreneurPulse.com.au  Fact Sheet for Healthcare Providers: IncredibleEmployment.be  This test is not yet approved or cleared by the [place] FDA and has been authorized for detection and/or diagnosis of SARS-CoV-2 by FDA under an Emergency Use Authorization (EUA). This EUA will remain in effect (meaning this test can be used) for the duration of the COVID-19 declaration under Section 564(b)(1) of the Act,  21 U.S.C. section 360bbb-3(b)(1), unless the authorization  is terminated or revoked.  Performed at [facility], [address], [place], [place] [address]   Urine culture     Status: Abnormal   Collection Time: 10/29/20  8:12 PM   Specimen: In/Out Cath Urine  Result Value Ref Range Status   Specimen Description   Final    IN/OUT CATH URINE Performed at [facility], [address], [place], [place] [address]    Special Requests   Final    Immunocompromised Performed at [facility], [address] [place], [place] [address]    Culture >=100,000 COLONIES/mL STAPHYLOCOCCUS AUREUS (A)  Final   Report Status 11/01/2020 FINAL  Final   Organism ID, Bacteria STAPHYLOCOCCUS AUREUS (A)  Final      Susceptibility   Staphylococcus aureus - MIC*    CIPROFLOXACIN <=0.5 SENSITIVE Sensitive     GENTAMICIN <=0.5 SENSITIVE Sensitive     NITROFURANTOIN <=16 SENSITIVE Sensitive     OXACILLIN <=0.25 SENSITIVE Sensitive     TETRACYCLINE <=1 SENSITIVE Sensitive     VANCOMYCIN 1 SENSITIVE Sensitive     TRIMETH/SULFA <=10 SENSITIVE Sensitive     CLINDAMYCIN <=0.25 SENSITIVE Sensitive     RIFAMPIN <=0.5 SENSITIVE Sensitive     Inducible Clindamycin NEGATIVE Sensitive     * >=100,000 COLONIES/mL STAPHYLOCOCCUS AUREUS  MRSA PCR Screening     Status: None   Collection Time: 10/30/20 12:17 AM   Specimen: Nasal Mucosa; Nasopharyngeal  Result Value Ref Range Status   MRSA by PCR NEGATIVE NEGATIVE Final    Comment:        The GeneXpert MRSA Assay (FDA approved for NASAL specimens only), is one component of a comprehensive MRSA colonization surveillance program. It is not intended to diagnose MRSA infection nor to guide or monitor treatment for MRSA infections. Performed at [facility], [address] [place], [place] [address]   C Difficile Quick Screen w PCR reflex     Status: Abnormal   Collection Time: 10/30/20 11:15 AM   Specimen: STOOL  Result Value Ref Range Status   C Diff antigen POSITIVE (A) NEGATIVE Final   C Diff toxin POSITIVE (A) NEGATIVE  Final   C Diff interpretation Toxin producing C. difficile detected.  Final    Comment: CRITICAL RESULT CALLED TO, READ BACK BY AND VERIFIED WITH: FOLEY,BRITANY '@[id number]'  10/30/20 BY JONES,T Performed at [facility], [address] [place], [place] [address]      Radiology Studies: DG CHEST PORT 1 VIEW  Result Date: 11/02/2020 CLINICAL DATA:  Chest pain when swallowing water EXAM: PORTABLE CHEST 1 VIEW COMPARISON:  10/29/2020 FINDINGS: Cardiomegaly and aortic tortuosity. Stable lung markings. There is no edema, consolidation, effusion, or pneumothorax. IMPRESSION: 1. No acute finding when compared to prior. 2. Cardiomegaly. Electronically Signed   By: Monte Fantasia M.D.   On: 11/02/2020 04:36   Scheduled Meds: . amLODipine  10 mg Oral Daily  . apixaban  5 mg Oral BID  . Chlorhexidine Gluconate Cloth  6 each Topical Daily  . clopidogrel  75 mg Oral Daily  . DULoxetine  60 mg Oral Daily  . fluconazole  150 mg Oral Once  . gabapentin  300 mg Oral TID  . guaiFENesin  1,200 mg Oral BID  . insulin aspart  0-15 Units Subcutaneous TID WC  . insulin aspart  0-5 Units Subcutaneous QHS  . insulin aspart  4 Units Subcutaneous TID WC  . insulin glargine  12 Units Subcutaneous Q24H  .  levETIRAcetam  750 mg Oral BID  . metoprolol succinate  50 mg Oral BID  . montelukast  10 mg Oral QHS  . pantoprazole  40 mg Oral BID  . rosuvastatin  10 mg Oral Daily  . saccharomyces boulardii  250 mg Oral BID  . vancomycin  125 mg Oral QID   Continuous Infusions: . 0.9 % NaCl with KCl 20 mEq / L 75 mL/hr at 11/03/20 0913     LOS: 5 days   Time spent: 36 mins   [place] Wynetta Emery, MD How to contact the [facility] Attending or Consulting provider [place] or covering provider during after hours [place], for this patient?  1. Check the care team in Operating Room Services and look for a) attending/consulting TRH provider listed and b) the [facility] team listed 2. Log into [email or web address] and use [place]'s universal password to access. If you  do not have the password, please contact the hospital operator. 3. Locate the [facility] provider you are looking for under [facility] and page to a number that you can be directly reached. 4. If you still have difficulty reaching the provider, please page the [facility] (Director on Call) for the Hospitalists listed on amion for assistance.  11/03/2020, 1:09 PM

## 2020-11-04 DIAGNOSIS — I4891 Unspecified atrial fibrillation: Secondary | ICD-10-CM | POA: Diagnosis not present

## 2020-11-04 DIAGNOSIS — I5042 Chronic combined systolic (congestive) and diastolic (congestive) heart failure: Secondary | ICD-10-CM | POA: Diagnosis not present

## 2020-11-04 DIAGNOSIS — E1110 Type 2 diabetes mellitus with ketoacidosis without coma: Secondary | ICD-10-CM | POA: Diagnosis not present

## 2020-11-04 DIAGNOSIS — N179 Acute kidney failure, unspecified: Secondary | ICD-10-CM | POA: Diagnosis not present

## 2020-11-04 LAB — GLUCOSE, CAPILLARY
Glucose-Capillary: 73 mg/dL (ref 70–99)
Glucose-Capillary: 116 mg/dL — ABNORMAL HIGH (ref 70–99)
Glucose-Capillary: 57 mg/dL — ABNORMAL LOW (ref 70–99)
Glucose-Capillary: 180 mg/dL — ABNORMAL HIGH (ref 70–99)
Glucose-Capillary: 119 mg/dL — ABNORMAL HIGH (ref 70–99)
Glucose-Capillary: 133 mg/dL — ABNORMAL HIGH (ref 70–99)

## 2020-11-04 LAB — RESP PANEL BY RT-PCR (FLU A&B, COVID) ARPGX2
Influenza A by PCR: NEGATIVE
Influenza B by PCR: NEGATIVE
SARS Coronavirus 2 by RT PCR: NEGATIVE

## 2020-11-04 MED ORDER — VANCOMYCIN 50 MG/ML ORAL SOLUTION: 125.0000 mg | Freq: Four times a day (QID) | ORAL | 0 refills | Status: DC

## 2020-11-04 MED ORDER — CLONIDINE HCL 0.2 MG PO TABS: 0.2000 mg | ORAL_TABLET | Freq: Two times a day (BID) | ORAL | Status: DC

## 2020-11-04 MED ORDER — DEXTROSE 50 % IV SOLN
INTRAVENOUS | Status: AC
  Administered 2020-11-04: 25 mL
  Filled 2020-11-04: qty 50

## 2020-11-04 MED ORDER — INSULIN ASPART 100 UNIT/ML ~~LOC~~ SOLN
3.0000 [IU] | Freq: Three times a day (TID) | SUBCUTANEOUS | Status: DC
  Administered 2020-11-04 – 2020-11-14 (×20): 3 [IU] via SUBCUTANEOUS

## 2020-11-04 MED ORDER — SACCHAROMYCES BOULARDII 250 MG PO CAPS: 250.0000 mg | ORAL_CAPSULE | Freq: Two times a day (BID) | ORAL | 0 refills | Status: AC

## 2020-11-04 MED ORDER — INSULIN GLARGINE 100 UNITS/ML SOLOSTAR PEN: 8.0000 [IU] | PEN_INJECTOR | Freq: Every day | SUBCUTANEOUS | 1 refills | Status: DC

## 2020-11-04 MED ORDER — INSULIN GLARGINE 100 UNIT/ML ~~LOC~~ SOLN
9.0000 [IU] | SUBCUTANEOUS | Status: DC
  Administered 2020-11-05 – 2020-11-14 (×9): 9 [IU] via SUBCUTANEOUS
  Filled 2020-11-04 (×12): qty 0.09

## 2020-11-04 MED ORDER — INSULIN ASPART 100 UNIT/ML ~~LOC~~ SOLN: 3.0000 [IU] | Freq: Three times a day (TID) | SUBCUTANEOUS | 11 refills | Status: DC

## 2020-11-04 MED ORDER — PANTOPRAZOLE SODIUM 40 MG PO TBEC: 40.0000 mg | DELAYED_RELEASE_TABLET | Freq: Every day | ORAL | Status: DC

## 2020-11-04 MED ORDER — GUAIFENESIN ER 600 MG PO TB12: 1200.0000 mg | ORAL_TABLET | Freq: Two times a day (BID) | ORAL | 0 refills | Status: AC

## 2020-11-04 MED ORDER — ACETAMINOPHEN 325 MG PO TABS: 650.0000 mg | ORAL_TABLET | Freq: Four times a day (QID) | ORAL | Status: AC | PRN

## 2020-11-04 NOTE — Progress Notes (Signed)
Hypoglycemic Event  CBG: 57  Treatment: 12.5g D50 (patient refused to take PO)  Symptoms: none  Follow-up CBG: Time: 0816 CBG Result: 116  Possible Reasons for Event: unknown  Comments/MD notified: Dr. Jamesetta Geralds

## 2020-11-04 NOTE — Progress Notes (Signed)
Physical Therapy Note  Patient Details  Name: [name] MRN: [id number] Date of Birth: 1965/11/12 Today's Date: 11/04/2020    Pt refused yelling "No!" from underneath the blankets when asked to participate with therapy.  Teena Irani, PTA/CLT [phone]    [name] 11/04/2020, 2:30 PM

## 2020-11-04 NOTE — TOC Progression Note (Addendum)
Transition of Care [facility]) - Progression Note    Patient Details  Name: [name] MRN: [id number] Date of Birth: July 26, 1966  Transition of Care [facility]) CM/SW Contact  Salome Arnt, [place] Phone Number: 11/04/2020, 4:32 PM  Clinical Narrative:  Pt d/c today. LCSW spoke with pt about d/c to [facility] and she states she never authorized this. LCSW confirmed from note on 1/7 that pt was agreeable to [facility]. She states she will not go there as she has been there before. Pt is aware she does not have any other bed offers, but she continues to refuse and said she isn't going anywhere. Discussed homeless shelters as pt said she has nowhere else to go. Pt refuses to contact any and said she will not go to a homeless shelter. RN present for conversation and also encouraged pt to go to SNF for rehab. Pt refused multiple times. Pt was given IM. She was educated on the fact that she is medically stable and has a d/c order and cannot remain in the hospital. LCSW notified supervisor who will contact [address] director. RN updated. LCSW called pt again and she states she spoke to her paralegal and he said we couldn't "kick her out with nowhere to go." LCSW reminded pt she does have somewhere to go, but she was refusing that option. Pt hung up on LCSW. RN updated. LCSW left voicemail for SNF.        Expected Discharge Plan: [place] Barriers to Discharge: Continued Medical Work up  Expected Discharge Plan and Services Expected Discharge Plan: [place] In-house Referral: Clinical Social Work   Post Acute Care Choice: [place] Living arrangements for the past 2 months: Single Family Home Expected Discharge Date: 11/04/20                                     Social Determinants of Health (SDOH) Interventions    Readmission Risk Interventions Readmission Risk Prevention Plan 10/31/2020 08/27/2020  Transportation  Screening Complete Complete  Medication Review Press photographer) Complete Complete  PCP or Specialist appointment within 3-5 days of discharge - Complete  HRI or [place] - Complete  SW Recovery Care/Counseling Consult Complete Complete  Palliative Care Screening Not Applicable Not Applicable  Skilled Nursing Facility Complete Complete  Some recent data might be hidden

## 2020-11-04 NOTE — Progress Notes (Addendum)
Inpatient Diabetes Program Recommendations  AACE/ADA: New Consensus Statement on Inpatient Glycemic Control (2015)  Target Ranges:  Prepandial:   less than 140 mg/dL      Peak postprandial:   less than 180 mg/dL (1-2 hours)      Critically ill patients:  140 - 180 mg/dL   Results for JEZELLE, GULLICK (MRN [id number]) as of 11/04/2020 07:36  Ref. Range 11/03/2020 07:37 11/03/2020 11:14 11/03/2020 16:52 11/03/2020 21:36 11/04/2020 03:18  Glucose-Capillary Latest Ref Range: 70 - 99 mg/dL 136 (H)  6 units NOVOLOG @9am   171 (H)  7 units NOVOLOG @1pm   12 units LANTUS @1pm   59 (L) 72 73   Results for SIERA, BEYERSDORF (MRN [id number]) as of 11/04/2020 07:55  Ref. Range 11/04/2020 07:35  Glucose-Capillary Latest Ref Range: 70 - 99 mg/dL 57 (L)   Home DM Meds: Januvia 100 mg daily        Lantus 10 units daily (per home med list not taking Lantus)  Current Orders: Lantus 12 units Q24 hours      Novolog 4 units TID with meals      Novolog Moderate Correction Scale/ SSI (0-15 units) TID AC + HS     MD- Note both Lantus and Novolog Meal Coverage reduced yesterday.  Pt had another Hypoglycemic event yesterday at 5pm after getting total of 7 units Novolog at 1pm per MAR.  CBG 57 this AM (7:35am)  Please consider:  1. Reduce Lantus further to 8 units Q24 hours  2. Reduce Novolog Meal Coverage to 3 units TID with meals    --Will follow patient during hospitalization--  Wyn Quaker RN, MSN, CDE Diabetes Coordinator Inpatient Glycemic Control Team Team Pager: [phone] (8a-5p)

## 2020-11-04 NOTE — Discharge Summary (Addendum)
Physician Discharge Summary  AZIZAH LISLE JSH:[id number] DOB: Sep 16, 1966 DOA: 10/29/2020  PCP: [name], MD  Admit date: 10/29/2020 Discharge date: 11/04/2020  Admitted From:  Home  Disposition:  SNF   Recommendations for Outpatient Follow-up:  1. Follow up with PCP in 1-2 weeks 2. Please monitor blood sugar at least 3-4 times per day 3. Please check BMP in 1 week to follow up renal function.    Discharge Condition: STABLE   CODE STATUS: FULL   DIET: Carb modified, no concentrated sweets or fruit juices except to treat a low blood glucose  Brief Hospitalization Summary: Please see all hospital notes, images, labs for full details of the hospitalization.  ADMISSION HPI: The patient is a 55 y.o. year-old w/ hx of CVA, seizures, obesity, tobacco abuse, CM EF 25%, HL, hx GIB, HTN, diabetes on insulin, Charcot foot, COPD, cocaine abuse, CKD 3, chronic syst/ diast CHF, anemia, etoh use presented to ED by EMS w/ c/o laying in bed for 2 days w/o medications.  She had reportedly been vomiting and having diarrhea and has not been able to keep down her medication. EMS reported pt's boyfriend left 2 days ago and did not return, we has helping to take care of her and give her her medications. EMS BP 80/60, temp 94.5, HR 160 and CBG 414. In ED RR 31, temp 94.8, HR 145 - 162, RA 97% sat, BP 70/30 >> 150/70 after cardioversion. BS > 600, Na 133 CO2 17  AG 25  Glu 874  BUN 61  Cr 2.47  Ca 8.8  Alb 3.2  LFT's okay, egfr 22  WBC 17K  Hb 16.6  plt 327  Lactic 2.3 > 1.9  Beta-hydroxy ^ 7.16, tsh 0.5   CXR showed LLL atelectasis, no consolidation. EKG showed wide-complex tachycardia at 181 bpm afib w/ RVR, QRS 125 msec. Repeat ekg after cardioversion x 2 (120J, 150J) showed sinus tach at 104 bpm.  In ED she was cardioverted as above and started on IV insulin drip and received IV vanc 1500 mg x 1, IV zosyn also ordered. Got 4 L total bolus IVF"s. Blood and urine cx's sent.  Asked to see for admission.    Pt seen in room, lives alone, uses a walker or wheelchair mostly, needs help caring for herself.  Denies any recent CP or abd pain, +n/v and diarrhea, no fevers or chills.  Vague historian.    Hospital Course   1. Atrial fibrillation with RVR - RESOLVED. Pt s/p 2 cardioversions performed in ED by ED staff.  Pt is now in sinus tachycardia, she has been restarted on home oral medications for Afib and anticoagulation.  Following closely.  Continue home metoprolol XL 50 mg BID.   2. C diff infection with diarrhea - Pt started on oral vancomycin solution QID x 10 days.  Continue supportive measures.  Continue oral vancomycin thru 11/09/20.  3. AKI - improved with hydration. Pt has refused further labs and have not been able to check renal function for last several days.   4. Hypotension - resolved now.  Was multifactorial given dehydration from DKA and Afib RVR.   5. DKA - RESOLVED.  Pt presented with acute DKA, treated with IV insulin infusion, now transitioned to subcutaneous insulin.  Follow CBG closely.  Titrating doses for better glycemic control.  6. Hypoglycemia - from insulin.  Poor oral intake,reduced insulin dose.  Check CBG closely.   7. Debilitation -PT recommended SNF placement.    8.  Chronic combined sys/diastolic heart failure - stable, following.  9. Epilepsy - resumed home keppra.  10. COPD - stable.  11. Depression - resume home meds.  12. Yeast vaginitis - fluconazole treated 11/03/20.  13. Sepsis ruled out  DVT prophylaxis: apixaban  Code Status: Full  Family Communication: none present  Disposition:  SNF placement   Discharge Diagnoses:  Principal Problem:   DKA (diabetic ketoacidosis) ([place]) Active Problems:   Essential hypertension   Chronic pain syndrome   Chronic combined systolic and diastolic CHF (congestive heart failure) ([facility])   COPD (chronic obstructive pulmonary disease) ([facility])   AKI (acute kidney injury) ([place])   Atrial fibrillation with RVR ([place])    Seizures ([place])   Shock ([place])   Discharge Instructions:  Allergies as of 11/04/2020   No Known Allergies     Medication List    STOP taking these medications   olmesartan 40 MG tablet Commonly known as: BENICAR     TAKE these medications   acetaminophen 325 MG tablet Commonly known as: TYLENOL Take 2 tablets (650 mg total) by mouth every 6 (six) hours as needed for headache, fever or mild pain.   albuterol 108 (90 Base) MCG/ACT inhaler Commonly known as: ProAir HFA INHALE 2 PUFFS EVERY 6 HOURS AS NEEDED FOR SHORTNESS OF BREATH/WHEEZING. What changed:   how much to take  how to take this  when to take this  reasons to take this  additional instructions   amLODipine 10 MG tablet Commonly known as: NORVASC TAKE 1 TABLET BY MOUTH ONCE A DAY.   cloNIDine 0.2 MG tablet Commonly known as: Catapres Take 1 tablet (0.2 mg total) by mouth 2 (two) times daily. What changed:   medication strength  how much to take   clopidogrel 75 MG tablet Commonly known as: PLAVIX TAKE 1 TABLET BY MOUTH DAILY WITH BREAKFAST. What changed:   when to take this  additional instructions   DULoxetine 30 MG capsule Commonly known as: CYMBALTA TAKE 2 CAPSULES BY MOUTH ONCE DAILY.   Eliquis 5 MG Tabs tablet Generic drug: apixaban Take 5 mg by mouth 2 (two) times daily.   gabapentin 300 MG capsule Commonly known as: NEURONTIN TAKE 1 CAPSULE BY MOUTH THREE TIMES A DAY.   guaiFENesin 600 MG 12 hr tablet Commonly known as: MUCINEX Take 2 tablets (1,200 mg total) by mouth 2 (two) times daily for 5 days.   insulin aspart 100 UNIT/ML injection Commonly known as: novoLOG Inject 3 Units into the skin 3 (three) times daily with meals. Give only if eats 50% or more of meal   insulin glargine 100 unit/mL Sopn Commonly known as: LANTUS Inject 8 Units into the skin daily. Start taking on: November 05, 2020 What changed: how much to take   levETIRAcetam 750 MG tablet Commonly known  as: KEPPRA Take 1 tablet (750 mg total) by mouth 2 (two) times daily.   metoprolol succinate 50 MG 24 hr tablet Commonly known as: TOPROL-XL TAKE 1 TABLET BY MOUTH TWICE A DAY. What changed: when to take this   montelukast 10 MG tablet Commonly known as: SINGULAIR TAKE (1) TABLET BY MOUTH AT BEDTIME. What changed: See the new instructions.   oxybutynin 5 MG 24 hr tablet Commonly known as: DITROPAN-XL Take 5 mg by mouth at bedtime.   pantoprazole 40 MG tablet Commonly known as: PROTONIX Take 1 tablet (40 mg total) by mouth daily.   rosuvastatin 10 MG tablet Commonly known as: CRESTOR TAKE 1 TABLET BY MOUTH  ONCE A DAY.   saccharomyces boulardii 250 MG capsule Commonly known as: FLORASTOR Take 1 capsule (250 mg total) by mouth 2 (two) times daily for 14 days.   sitaGLIPtin 100 MG tablet Commonly known as: JANUVIA Take 100 mg by mouth daily.   Spiriva HandiHaler 18 MCG inhalation capsule Generic drug: tiotropium Place 18 mcg into inhaler and inhale daily as needed (Shortness of breath).   traZODone 50 MG tablet Commonly known as: DESYREL TAKE (1) TABLET BY MOUTH AT BEDTIME. What changed: See the new instructions.   vancomycin 50 mg/mL  oral solution Commonly known as: VANCOCIN Take 2.5 mLs (125 mg total) by mouth 4 (four) times daily for 5 days.       Contact information for after-discharge care    Destination    [facility] Preferred SNF .   Service: Skilled Nursing Contact information: [address] [place] [phone]                 No Known Allergies Allergies as of 11/04/2020   No Known Allergies     Medication List    STOP taking these medications   olmesartan 40 MG tablet Commonly known as: BENICAR     TAKE these medications   acetaminophen 325 MG tablet Commonly known as: TYLENOL Take 2 tablets (650 mg total) by mouth every 6 (six) hours as needed for headache, fever or mild pain.    albuterol 108 (90 Base) MCG/ACT inhaler Commonly known as: ProAir HFA INHALE 2 PUFFS EVERY 6 HOURS AS NEEDED FOR SHORTNESS OF BREATH/WHEEZING. What changed:   how much to take  how to take this  when to take this  reasons to take this  additional instructions   amLODipine 10 MG tablet Commonly known as: NORVASC TAKE 1 TABLET BY MOUTH ONCE A DAY.   cloNIDine 0.2 MG tablet Commonly known as: Catapres Take 1 tablet (0.2 mg total) by mouth 2 (two) times daily. What changed:   medication strength  how much to take   clopidogrel 75 MG tablet Commonly known as: PLAVIX TAKE 1 TABLET BY MOUTH DAILY WITH BREAKFAST. What changed:   when to take this  additional instructions   DULoxetine 30 MG capsule Commonly known as: CYMBALTA TAKE 2 CAPSULES BY MOUTH ONCE DAILY.   Eliquis 5 MG Tabs tablet Generic drug: apixaban Take 5 mg by mouth 2 (two) times daily.   gabapentin 300 MG capsule Commonly known as: NEURONTIN TAKE 1 CAPSULE BY MOUTH THREE TIMES A DAY.   guaiFENesin 600 MG 12 hr tablet Commonly known as: MUCINEX Take 2 tablets (1,200 mg total) by mouth 2 (two) times daily for 5 days.   insulin aspart 100 UNIT/ML injection Commonly known as: novoLOG Inject 3 Units into the skin 3 (three) times daily with meals. Give only if eats 50% or more of meal   insulin glargine 100 unit/mL Sopn Commonly known as: LANTUS Inject 8 Units into the skin daily. Start taking on: November 05, 2020 What changed: how much to take   levETIRAcetam 750 MG tablet Commonly known as: KEPPRA Take 1 tablet (750 mg total) by mouth 2 (two) times daily.   metoprolol succinate 50 MG 24 hr tablet Commonly known as: TOPROL-XL TAKE 1 TABLET BY MOUTH TWICE A DAY. What changed: when to take this   montelukast 10 MG tablet Commonly known as: SINGULAIR TAKE (1) TABLET BY MOUTH AT BEDTIME. What changed: See the new instructions.   oxybutynin 5 MG 24  hr tablet Commonly known as:  DITROPAN-XL Take 5 mg by mouth at bedtime.   pantoprazole 40 MG tablet Commonly known as: PROTONIX Take 1 tablet (40 mg total) by mouth daily.   rosuvastatin 10 MG tablet Commonly known as: CRESTOR TAKE 1 TABLET BY MOUTH ONCE A DAY.   saccharomyces boulardii 250 MG capsule Commonly known as: FLORASTOR Take 1 capsule (250 mg total) by mouth 2 (two) times daily for 14 days.   sitaGLIPtin 100 MG tablet Commonly known as: JANUVIA Take 100 mg by mouth daily.   Spiriva HandiHaler 18 MCG inhalation capsule Generic drug: tiotropium Place 18 mcg into inhaler and inhale daily as needed (Shortness of breath).   traZODone 50 MG tablet Commonly known as: DESYREL TAKE (1) TABLET BY MOUTH AT BEDTIME. What changed: See the new instructions.   vancomycin 50 mg/mL  oral solution Commonly known as: VANCOCIN Take 2.5 mLs (125 mg total) by mouth 4 (four) times daily for 5 days.       Procedures/Studies: CT ABDOMEN PELVIS WO CONTRAST  Result Date: 10/29/2020 CLINICAL DATA:  Nausea vomiting EXAM: CT ABDOMEN AND PELVIS WITHOUT CONTRAST TECHNIQUE: Multidetector CT imaging of the abdomen and pelvis was performed following the standard protocol without IV contrast. COMPARISON:  Radiograph 08/21/2020, CT 07/12/2018 FINDINGS: Lower chest: Lung bases demonstrate small bilateral pleural effusions. Hazy posterior lung bases likely atelectasis. Cardiomegaly. Mild circumferential distal esophageal thickening. Hepatobiliary: No focal liver abnormality is seen. No gallstones, gallbladder wall thickening, or biliary dilatation. Pancreas: Unremarkable. No pancreatic ductal dilatation or surrounding inflammatory changes. Spleen: Normal in size without focal abnormality. Adrenals/Urinary Tract: Adrenal glands are within normal limits. Kidneys show no hydronephrosis. Nonspecific perinephric fat stranding. Markedly distended urinary bladder. Stomach/Bowel: The stomach is nonenlarged. No dilated small bowel. Negative  appendix. No acute bowel wall thickening. Vascular/Lymphatic: Moderate aortic atherosclerosis. No aneurysm. No suspicious nodes. Reproductive: Uterus is unremarkable.  No adnexal mass. Other: Negative for free air or free fluid. Musculoskeletal: Moderate superior endplate compression deformity at L1 with about 40% loss of height of anterior vertebral body. Degenerative changes most advanced at L3-L4 and L5-S1. IMPRESSION: 1. No CT evidence for acute intra-abdominal or pelvic abnormality. 2. Mild distal esophageal thickening, question esophagitis or reflux. 3. Small bilateral pleural effusions. Cardiomegaly. 4. Moderate superior endplate compression deformity at L1, of uncertain age, new since 2019. Aortic Atherosclerosis (ICD10-I70.0). Electronically Signed   By: Donavan Foil M.D.   On: 10/29/2020 18:57   DG CHEST PORT 1 VIEW  Result Date: 11/02/2020 CLINICAL DATA:  Chest pain when swallowing water EXAM: PORTABLE CHEST 1 VIEW COMPARISON:  10/29/2020 FINDINGS: Cardiomegaly and aortic tortuosity. Stable lung markings. There is no edema, consolidation, effusion, or pneumothorax. IMPRESSION: 1. No acute finding when compared to prior. 2. Cardiomegaly. Electronically Signed   By: Monte Fantasia M.D.   On: 11/02/2020 04:36   DG Chest Port 1 View  Result Date: 10/29/2020 CLINICAL DATA:  Questionable sepsis. EXAM: PORTABLE CHEST 1 VIEW COMPARISON:  August 19, 2020. FINDINGS: Enlarged cardiac silhouette, similar to prior. No confluent consolidation. Streaky left basilar opacity. No visible pleural effusions or pneumothorax on this semi upright portable radiograph. No acute osseous abnormality. IMPRESSION: Streaky left basilar opacity, favored to reflect atelectasis. No confluent consolidation. Electronically Signed   By: Margaretha Sheffield MD   On: 10/29/2020 15:04      Subjective: Pt reports that she wants to leave hospital today.    Discharge Exam: Vitals:   11/03/20 2135 11/04/20 0531  BP: (!) 149/95 Marland Kitchen)  164/97  Pulse: 73 73  Resp: 18 18  Temp: 98.4 F (36.9 C) 98.3 F (36.8 C)  SpO2: 100% 100%   Vitals:   11/03/20 0536 11/03/20 1430 11/03/20 2135 11/04/20 0531  BP: 127/87 (!) 130/92 (!) 149/95 (!) 164/97  Pulse: 80 71 73 73  Resp: _0 Temp: 98.5 F (36.9 C) 98.3 F (36.8 C) 98.4 F (36.9 C) 98.3 F (36.8 C)  TempSrc: Oral Oral Oral Oral  SpO2: 99% 100% 100% 100%  Weight:      Height:       General exam: awake, alert, NAD, cooperative, chronically ill appearing, Appears calm and comfortable  Respiratory system: Clear to auscultation. Respiratory effort normal. Cardiovascular system: normal S1 & S2 heard. No JVD, murmurs, rubs, gallops or clicks. No pedal edema. Gastrointestinal system: Abdomen is nondistended, soft and nontender. No organomegaly or masses felt. Normal bowel sounds heard. Central nervous system: Alert and oriented. No focal neurological deficits. Extremities: Symmetric 5 x 5 power. Skin: No rashes, lesions or ulcers Psychiatry:  Mood & affect flat.   The results of significant diagnostics from this hospitalization (including imaging, microbiology, ancillary and laboratory) are listed below for reference.     Microbiology: Recent Results (from the past 240 hour(s))  Blood Culture (routine x 2)     Status: None   Collection Time: 10/29/20  1:38 PM   Specimen: BLOOD LEFT ARM  Result Value Ref Range Status   Specimen Description BLOOD LEFT ARM  Final   Special Requests   Final    BOTTLES DRAWN AEROBIC AND ANAEROBIC Blood Culture results may not be optimal due to an inadequate volume of blood received in culture bottles   Culture   Final    NO GROWTH 5 DAYS Performed at [facility], [address], [place], [place] [address]    Report Status 11/03/2020 FINAL  Final  Blood Culture (routine x 2)     Status: None   Collection Time: 10/29/20  3:14 PM   Specimen: BLOOD RIGHT HAND  Result Value Ref Range Status   Specimen Description BLOOD RIGHT HAND   Final   Special Requests   Final    BOTTLES DRAWN AEROBIC AND ANAEROBIC Blood Culture results may not be optimal due to an inadequate volume of blood received in culture bottles   Culture   Final    NO GROWTH 5 DAYS Performed at [facility], [address] [place],  [address]    Report Status 11/03/2020 FINAL  Final  Resp Panel by RT-PCR (Flu A&B, Covid) Nasopharyngeal Swab     Status: None   Collection Time: 10/29/20  5:20 PM   Specimen: Nasopharyngeal Swab; Nasopharyngeal(NP) swabs in vial transport medium  Result Value Ref Range Status   SARS Coronavirus 2 by RT PCR NEGATIVE NEGATIVE Final    Comment: (NOTE) SARS-CoV-2 target nucleic acids are NOT DETECTED.  The SARS-CoV-2 RNA is generally detectable in upper respiratory specimens during the acute phase of infection. The lowest concentration of SARS-CoV-2 viral copies this assay can detect is 138 copies/mL. A negative result does not preclude SARS-Cov-2 infection and should not be used as the sole basis for treatment or other patient management decisions. A negative result may occur with  improper specimen collection/handling, submission of specimen other than nasopharyngeal swab, presence of viral mutation(s) within the areas targeted by this assay, and inadequate number of viral copies(<138 copies/mL). A negative result must be combined with clinical observations, patient history, and epidemiological  information. The expected result is Negative.  Fact Sheet for Patients:  EntrepreneurPulse.com.au  Fact Sheet for Healthcare Providers:  IncredibleEmployment.be  This test is no t yet approved or cleared by the [place] FDA and  has been authorized for detection and/or diagnosis of SARS-CoV-2 by FDA under an Emergency Use Authorization (EUA). This EUA will remain  in effect (meaning this test can be used) for the duration of the COVID-19 declaration under Section 564(b)(1) of the  Act, 21 U.S.C.section 360bbb-3(b)(1), unless the authorization is terminated  or revoked sooner.       Influenza A by PCR NEGATIVE NEGATIVE Final   Influenza B by PCR NEGATIVE NEGATIVE Final    Comment: (NOTE) The Xpert Xpress SARS-CoV-2/FLU/RSV plus assay is intended as an aid in the diagnosis of influenza from Nasopharyngeal swab specimens and should not be used as a sole basis for treatment. Nasal washings and aspirates are unacceptable for Xpert Xpress SARS-CoV-2/FLU/RSV testing.  Fact Sheet for Patients: EntrepreneurPulse.com.au  Fact Sheet for Healthcare Providers: IncredibleEmployment.be  This test is not yet approved or cleared by the [place] FDA and has been authorized for detection and/or diagnosis of SARS-CoV-2 by FDA under an Emergency Use Authorization (EUA). This EUA will remain in effect (meaning this test can be used) for the duration of the COVID-19 declaration under Section 564(b)(1) of the Act, 21 U.S.C. section 360bbb-3(b)(1), unless the authorization is terminated or revoked.  Performed at [name], [address] [place], [place] [address]   Urine culture     Status: Abnormal   Collection Time: 10/29/20  8:12 PM   Specimen: In/Out Cath Urine  Result Value Ref Range Status   Specimen Description   Final    IN/OUT CATH URINE Performed at [facility], [address] [place], [place] [address]    Special Requests   Final    Immunocompromised Performed at [facility], [address] [place], [place] [address]    Culture >=100,000 COLONIES/mL STAPHYLOCOCCUS AUREUS (A)  Final   Report Status 11/01/2020 FINAL  Final   Organism ID, Bacteria STAPHYLOCOCCUS AUREUS (A)  Final      Susceptibility   Staphylococcus aureus - MIC*    CIPROFLOXACIN <=0.5 SENSITIVE Sensitive     GENTAMICIN <=0.5 SENSITIVE Sensitive     NITROFURANTOIN <=16 SENSITIVE Sensitive     OXACILLIN <=0.25 SENSITIVE Sensitive     TETRACYCLINE  <=1 SENSITIVE Sensitive     VANCOMYCIN 1 SENSITIVE Sensitive     TRIMETH/SULFA <=10 SENSITIVE Sensitive     CLINDAMYCIN <=0.25 SENSITIVE Sensitive     RIFAMPIN <=0.5 SENSITIVE Sensitive     Inducible Clindamycin NEGATIVE Sensitive     * >=100,000 COLONIES/mL STAPHYLOCOCCUS AUREUS  MRSA PCR Screening     Status: None   Collection Time: 10/30/20 12:17 AM   Specimen: Nasal Mucosa; Nasopharyngeal  Result Value Ref Range Status   MRSA by PCR NEGATIVE NEGATIVE Final    Comment:        The GeneXpert MRSA Assay (FDA approved for NASAL specimens only), is one component of a comprehensive MRSA colonization surveillance program. It is not intended to diagnose MRSA infection nor to guide or monitor treatment for MRSA infections. Performed at [facility], [address] [place],  [address]   C Difficile Quick Screen w PCR reflex     Status: Abnormal   Collection Time: 10/30/20 11:15 AM   Specimen: STOOL  Result Value Ref Range Status   C Diff antigen POSITIVE (A) NEGATIVE Final  C Diff toxin POSITIVE (A) NEGATIVE Final   C Diff interpretation Toxin producing C. difficile detected.  Final    Comment: CRITICAL RESULT CALLED TO, READ BACK BY AND VERIFIED WITH: FOLEY,BRITANY _0  10/30/20 BY JONES,T Performed at [facility], [address], [place], [place] [address]      Labs: BNP (last 3 results) No results for input(s): BNP in the last 8760 hours. Basic Metabolic Panel: Recent Labs  Lab 10/29/20 1414 10/29/20 2206 10/30/20 0245 10/30/20 0736 10/31/20 0548 11/01/20 0400  NA  --  140 138 142 142 141  K  --  3.5 4.0 3.8 3.9 4.3  CL  --  105 106 109 113* 115*  CO2  --  20* 20* 20* 20* 19*  GLUCOSE  --  320* 181* 112* 198* 247*  BUN  --  53* 52* 50* 38* 29*  CREATININE  --  1.83* 1.83* 1.78* 1.65* 1.42*  CALCIUM  --  7.8* 7.8* 7.7* 7.8* 7.9*  MG 2.2  --   --   --  1.5* 2.3   Liver Function Tests: Recent Labs  Lab 10/29/20 1408  AST 7*  ALT 18  ALKPHOS 151*   BILITOT 1.3*  PROT 7.5  ALBUMIN 3.2*   No results for input(s): LIPASE, AMYLASE in the last 168 hours. No results for input(s): AMMONIA in the last 168 hours. CBC: Recent Labs  Lab 10/29/20 1408  WBC 17.1*  NEUTROABS 15.7*  HGB 16.6*  HCT 53.9*  MCV 94.9  PLT 327   Cardiac Enzymes: No results for input(s): CKTOTAL, CKMB, CKMBINDEX, TROPONINI in the last 168 hours. BNP: Invalid input(s): POCBNP CBG: Recent Labs  Lab 11/03/20 2136 11/04/20 0318 11/04/20 0735 11/04/20 0816 11/04/20 1105  GLUCAP 72 73 57* 116* 180*   D-Dimer No results for input(s): DDIMER in the last 72 hours. Hgb A1c No results for input(s): HGBA1C in the last 72 hours. Lipid Profile No results for input(s): CHOL, HDL, LDLCALC, TRIG, CHOLHDL, LDLDIRECT in the last 72 hours. Thyroid function studies No results for input(s): TSH, T4TOTAL, T3FREE, THYROIDAB in the last 72 hours.  Invalid input(s): FREET3 Anemia work up No results for input(s): VITAMINB12, FOLATE, FERRITIN, TIBC, IRON, RETICCTPCT in the last 72 hours. Urinalysis    Component Value Date/Time   COLORURINE YELLOW 10/29/2020 2012   APPEARANCEUR CLOUDY (A) 10/29/2020 2012   LABSPEC 1.016 10/29/2020 2012   PHURINE 5.0 10/29/2020 2012   GLUCOSEU >=500 (A) 10/29/2020 2012   HGBUR MODERATE (A) 10/29/2020 2012   BILIRUBINUR NEGATIVE 10/29/2020 2012   KETONESUR 20 (A) 10/29/2020 2012   PROTEINUR >=300 (A) 10/29/2020 2012   UROBILINOGEN 0.2 09/11/2014 1319   NITRITE NEGATIVE 10/29/2020 2012   LEUKOCYTESUR LARGE (A) 10/29/2020 2012   Sepsis Labs Invalid input(s): PROCALCITONIN,  WBC,  LACTICIDVEN Microbiology Recent Results (from the past 240 hour(s))  Blood Culture (routine x 2)     Status: None   Collection Time: 10/29/20  1:38 PM   Specimen: BLOOD LEFT ARM  Result Value Ref Range Status   Specimen Description BLOOD LEFT ARM  Final   Special Requests   Final    BOTTLES DRAWN AEROBIC AND ANAEROBIC Blood Culture results may not be  optimal due to an inadequate volume of blood received in culture bottles   Culture   Final    NO GROWTH 5 DAYS Performed at [facility], [address], [place], [place] [address]    Report Status 11/03/2020 FINAL  Final  Blood Culture (routine x  2)     Status: None   Collection Time: 10/29/20  3:14 PM   Specimen: BLOOD RIGHT HAND  Result Value Ref Range Status   Specimen Description BLOOD RIGHT HAND  Final   Special Requests   Final    BOTTLES DRAWN AEROBIC AND ANAEROBIC Blood Culture results may not be optimal due to an inadequate volume of blood received in culture bottles   Culture   Final    NO GROWTH 5 DAYS Performed at [facility], [address], [place], [place] [address]    Report Status 11/03/2020 FINAL  Final  Resp Panel by RT-PCR (Flu A&B, Covid) Nasopharyngeal Swab     Status: None   Collection Time: 10/29/20  5:20 PM   Specimen: Nasopharyngeal Swab; Nasopharyngeal(NP) swabs in vial transport medium  Result Value Ref Range Status   SARS Coronavirus 2 by RT PCR NEGATIVE NEGATIVE Final    Comment: (NOTE) SARS-CoV-2 target nucleic acids are NOT DETECTED.  The SARS-CoV-2 RNA is generally detectable in upper respiratory specimens during the acute phase of infection. The lowest concentration of SARS-CoV-2 viral copies this assay can detect is 138 copies/mL. A negative result does not preclude SARS-Cov-2 infection and should not be used as the sole basis for treatment or other patient management decisions. A negative result may occur with  improper specimen collection/handling, submission of specimen other than nasopharyngeal swab, presence of viral mutation(s) within the areas targeted by this assay, and inadequate number of viral copies(<138 copies/mL). A negative result must be combined with clinical observations, patient history, and epidemiological information. The expected result is Negative.  Fact Sheet for Patients:   EntrepreneurPulse.com.au  Fact Sheet for Healthcare Providers:  IncredibleEmployment.be  This test is no t yet approved or cleared by the [place] FDA and  has been authorized for detection and/or diagnosis of SARS-CoV-2 by FDA under an Emergency Use Authorization (EUA). This EUA will remain  in effect (meaning this test can be used) for the duration of the COVID-19 declaration under Section 564(b)(1) of the Act, 21 U.S.C.section 360bbb-3(b)(1), unless the authorization is terminated  or revoked sooner.       Influenza A by PCR NEGATIVE NEGATIVE Final   Influenza B by PCR NEGATIVE NEGATIVE Final    Comment: (NOTE) The Xpert Xpress SARS-CoV-2/FLU/RSV plus assay is intended as an aid in the diagnosis of influenza from Nasopharyngeal swab specimens and should not be used as a sole basis for treatment. Nasal washings and aspirates are unacceptable for Xpert Xpress SARS-CoV-2/FLU/RSV testing.  Fact Sheet for Patients: EntrepreneurPulse.com.au  Fact Sheet for Healthcare Providers: IncredibleEmployment.be  This test is not yet approved or cleared by the [place] FDA and has been authorized for detection and/or diagnosis of SARS-CoV-2 by FDA under an Emergency Use Authorization (EUA). This EUA will remain in effect (meaning this test can be used) for the duration of the COVID-19 declaration under Section 564(b)(1) of the Act, 21 U.S.C. section 360bbb-3(b)(1), unless the authorization is terminated or revoked.  Performed at [facility], [address], [place], [place] [address]   Urine culture     Status: Abnormal   Collection Time: 10/29/20  8:12 PM   Specimen: In/Out Cath Urine  Result Value Ref Range Status   Specimen Description   Final    IN/OUT CATH URINE Performed at [facility], [address], [place], [place] [address]    Special Requests   Final    Immunocompromised Performed at  [facility], [address],  [place], [place] [address]    Culture >=100,000 COLONIES/mL STAPHYLOCOCCUS AUREUS (A)  Final   Report Status 11/01/2020 FINAL  Final   Organism ID, Bacteria STAPHYLOCOCCUS AUREUS (A)  Final      Susceptibility   Staphylococcus aureus - MIC*    CIPROFLOXACIN <=0.5 SENSITIVE Sensitive     GENTAMICIN <=0.5 SENSITIVE Sensitive     NITROFURANTOIN <=16 SENSITIVE Sensitive     OXACILLIN <=0.25 SENSITIVE Sensitive     TETRACYCLINE <=1 SENSITIVE Sensitive     VANCOMYCIN 1 SENSITIVE Sensitive     TRIMETH/SULFA <=10 SENSITIVE Sensitive     CLINDAMYCIN <=0.25 SENSITIVE Sensitive     RIFAMPIN <=0.5 SENSITIVE Sensitive     Inducible Clindamycin NEGATIVE Sensitive     * >=100,000 COLONIES/mL STAPHYLOCOCCUS AUREUS  MRSA PCR Screening     Status: None   Collection Time: 10/30/20 12:17 AM   Specimen: Nasal Mucosa; Nasopharyngeal  Result Value Ref Range Status   MRSA by PCR NEGATIVE NEGATIVE Final    Comment:        The GeneXpert MRSA Assay (FDA approved for NASAL specimens only), is one component of a comprehensive MRSA colonization surveillance program. It is not intended to diagnose MRSA infection nor to guide or monitor treatment for MRSA infections. Performed at [facility], [address], [place], [place] [address]   C Difficile Quick Screen w PCR reflex     Status: Abnormal   Collection Time: 10/30/20 11:15 AM   Specimen: STOOL  Result Value Ref Range Status   C Diff antigen POSITIVE (A) NEGATIVE Final   C Diff toxin POSITIVE (A) NEGATIVE Final   C Diff interpretation Toxin producing C. difficile detected.  Final    Comment: CRITICAL RESULT CALLED TO, READ BACK BY AND VERIFIED WITH: FOLEY,BRITANY _0  10/30/20 BY JONES,T Performed at [facility], [address], [place], [place] [address]    Time coordinating discharge:  42 mins   SIGNED:  Irwin Brakeman, MD  [facility] 11/04/2020, 12:02 PM How to contact the [facility] Attending or Consulting  provider [place] or covering provider during after hours [place], for this patient?  1. Check the care team in [facility] and look for a) attending/consulting TRH provider listed and b) the [facility] team listed 2. Log into [email or web address] and use [place]'s universal password to access. If you do not have the password, please contact the hospital operator. 3. Locate the [facility] provider you are looking for under [facility] and page to a number that you can be directly reached. 4. If you still have difficulty reaching the provider, please page the [facility] (Director on Call) for the Hospitalists listed on amion for assistance.

## 2020-11-04 NOTE — Care Management Important Message (Signed)
Important Message  Patient Details  Name: [name] MRN: [id number] Date of Birth: 1966-01-30   Medicare Important Message Given:  Yes CMA attempted to speak with [name] by phone at [phone] to go over Medicare Rights but refused call and hung up. Sindy Guadeloupe (son) was contacted at [phone], no answer, left voicemail. A copy of medicare rights was delivered to room.    Tommy Medal 11/04/2020, 4:09 PM

## 2020-11-05 LAB — GLUCOSE, CAPILLARY
Glucose-Capillary: 187 mg/dL — ABNORMAL HIGH (ref 70–99)
Glucose-Capillary: 171 mg/dL — ABNORMAL HIGH (ref 70–99)
Glucose-Capillary: 184 mg/dL — ABNORMAL HIGH (ref 70–99)
Glucose-Capillary: 150 mg/dL — ABNORMAL HIGH (ref 70–99)

## 2020-11-05 NOTE — TOC Transition Note (Signed)
Transition of Care [facility]) - CM/SW Discharge Note   Patient Details  Name: [name] MRN: [id number] Date of Birth: 08-30-1966  [facility] of Care [facility] CM/SW Contact:  Natasha Bence, LCSW Phone Number: 11/05/2020, 4:48 PM   Clinical Narrative:    CSW spoke with patient about [facility] bed offer. Patient intially declined bed offer. CSW inquired about discharge planning and who patient would be able to return home with. Patient reported that she hadn't reviewed the homeless shelters and deferred placement discussion to her daughter. CSW spoke with patient's daughter about bed offers. [name] requested that CSW look into [facility]. CSW contacted [facility] who reported that they did not have any beds available. CSW notified patient's daughter. [name] requested that CSW contact patient's brother for discharge planning. [name] reported that he would not be able to take patient because of his work schedule and the lack of supervision that he would be able to provide. [name] differred back to patient's daughter. CSW contacted patient's daughter but received No Answer. CSW not able to leave voicemail. Patient reported to now be agreeable to [facility] CSW contacted [facility] to inquire about discharge on 11/05/2020 to their facility. Georgina Peer with [facility] reported that they would have to rescinded their bed offer due to a Covid out break at the facility. CSW notified patient and discussed [facility] offer. Patient refused bed offer from [facility]. CSW contacted [facility] to discuss pending bed offer. Ebony Hail with [facility] reported that patient was declined due to SA. CSW attempted contact with patient's daughter and brother. CSW not able to reach patient's daughter and brother. CSW again discussed placement options with patient and informed patient that continued stay would result in non insurance  coverage of hospital stay due to the lack of medical necessity that patient remain in the hospital. CSW also discussed with patient that she is pass the time to appeal. CSW noted that when attempts were made to deliver the appeal paper work, patient hung up on staff per note.  Patient stated " I don't care. I ain't gonna pay it." CSW consulted TOC supervisor. [facility] supervisor reported that we would be required to notify patient that with the decline of the safe discharge plan established to [facility], patient would be billed starting at midnight for further stay. CSW notified patient of amount that would be build to her and signed HNN 12. CSW discussed HWE99 with patient. Nurse agreeable to provide copy to patient.      Barriers to Discharge: Continued Medical Work up   Patient Goals and CMS Choice Patient states their goals for this hospitalization and ongoing recovery are:: feel better CMS Medicare.gov Compare Post Acute Care list provided to:: Patient Choice offered to / list presented to : Patient  Discharge Placement                       Discharge Plan and Services In-house Referral: Clinical Social Work   Post Acute Care Choice: [place]                               Social Determinants of Health (SDOH) Interventions     Readmission Risk Interventions Readmission Risk Prevention Plan 10/31/2020 08/27/2020  Transportation Screening Complete Complete  Medication Review Press photographer) Complete Complete  PCP or Specialist appointment within 3-5 days of discharge -  Complete  HRI or Home Care Consult - Complete  SW Recovery Care/Counseling Consult Complete Complete  Palliative Care Screening Not Applicable Not Applicable  Skilled Nursing Facility Complete Complete  Some recent data might be hidden

## 2020-11-06 ENCOUNTER — Ambulatory Visit: Payer: Self-pay | Admitting: *Deleted

## 2020-11-06 DIAGNOSIS — E1110 Type 2 diabetes mellitus with ketoacidosis without coma: Secondary | ICD-10-CM | POA: Diagnosis not present

## 2020-11-06 LAB — GLUCOSE, CAPILLARY
Glucose-Capillary: 183 mg/dL — ABNORMAL HIGH (ref 70–99)
Glucose-Capillary: 127 mg/dL — ABNORMAL HIGH (ref 70–99)
Glucose-Capillary: 145 mg/dL — ABNORMAL HIGH (ref 70–99)
Glucose-Capillary: 144 mg/dL — ABNORMAL HIGH (ref 70–99)
Glucose-Capillary: 143 mg/dL — ABNORMAL HIGH (ref 70–99)
Glucose-Capillary: 147 mg/dL — ABNORMAL HIGH (ref 70–99)

## 2020-11-06 NOTE — TOC Transition Note (Incomplete)
Transition of Care [facility]) - CM/SW Discharge Note   Patient Details  Name: [name] MRN: [id number] Date of Birth: 04-Apr-1966  [facility] CM/SW Contact:  Natasha Bence, LCSW Phone Number: 11/06/2020, 1:43 PM   Clinical Narrative:    Patient reported now being agreeable to [facility] at approximately 7:50am. CSW inquired about vaccination status. Patient reported that she was unaware of her Covid 19 vaccination status. CSW contacted patient's brother who reported that patient was not vaccinated. CSW informed [name] with [facility]. [name] reported that an isolation bed is available for patient. CSW restarted authorization for patient to adjust facility. Patient reported not being being agreeable to [facility] at approximately 9:50am. CSW spoke with MD and supervisor in reguard to discharge planning for patient. MD suggested that a CSW, RN, along with himself speak to the patient about discharging to [facility] given that it is the only bed offer available. CSW called to verify with [facility] that patient would be able to admit to the facility today if agreeable upon d/c. [name] initially reported that patient would need to sign over her disability check due to being in Copay days. This would also be due to expectation that patient's stay would require longer than than the amount allotted by insurance given patient's homelessness status and current medical needs. [name] later reported that she was informed that patient owes the facility due insurance stopping full payment past allotted number of stays during previous stay with [facility]. It was noted that patient refusal to submit bank records to apply for disability during her previous stay with [facility] in order to maintain placement with [facility]. CSW notified CSW who is meeting with patient to discuss [facility] Placement.  CSW received a returning phone call for patient's daughter. CSW updated patient's daughter on patient's discharge status and  discussed alternative options for discharge. CSW inquired if patient's daughter had reviewed homeless shelters with patient. [name] reported that she did not think it would be suitable for her mother's discharge given patient's medical needs. CSW reported that [facility] could be set up for patient to address needs while in a shelter. [name] expressed that she felt that other facilities have bed availability. CSW informed patient's daughter that [facility] was not able to require a facility to make a bed offer and that potential barriers that would explain the lack of bed offers include patient's history with non compliance, SA, and documented previous conflict with SNF staff. CSW also informed patient's daughter that patient would have to sign over her disability check once received to cover patient's boarding at [facility]. [name] expressed concern that a facility would take the check and then terminate placement. CSW informed patient's daughter that the SNF would have to identify a safe discharge plan before terminating placement. CSW informed patients daughter that because patient is in her Copay days, this would be a stipulation of any SNF unless patient had other means to cover cost.      Barriers to Discharge: Continued Medical Work up   Patient Goals and CMS Choice Patient states their goals for this hospitalization and ongoing recovery are:: feel better CMS Medicare.gov Compare Post Acute Care list provided to:: Patient Choice offered to / list presented to : Patient  Discharge Placement                       Discharge Plan and Services In-house Referral: Clinical Social Work   Post Acute Care Choice: [place]  Social Determinants of Health (SDOH) Interventions     Readmission Risk Interventions Readmission Risk Prevention Plan 10/31/2020 08/27/2020  Transportation Screening Complete Complete  Medication  Review Press photographer) Complete Complete  PCP or Specialist appointment within 3-5 days of discharge - Complete  HRI or [place] - Complete  SW Recovery Care/Counseling Consult Complete Complete  Palliative Care Screening Not Applicable Not Applicable  Skilled Nursing Facility Complete Complete  Some recent data might be hidden

## 2020-11-06 NOTE — TOC Progression Note (Signed)
Transition of Care [facility]) - Progression Note    Patient Details  Name: [name] MRN: [id number] Date of Birth: 13-Jul-1966  Transition of Care [facility]) CM/SW Contact  Natasha Bence, [place] Phone Number: 11/06/2020, 3:51 PM  Clinical Narrative:    Patient reported now being agreeable to [facility] at approximately 7:50am. CSW inquired about vaccination status. Patient reported that she was unaware of her Covid 19 vaccination status. CSW contacted patient's brother who reported that patient was not vaccinated. CSW informed [name] with [facility]. [name] reported that an isolation bed is available for patient. CSW restarted authorization for patient to adjust facility. Patient reported not being being agreeable to [facility] at approximately 9:50am. CSW spoke with MD and supervisor in reguard to discharge planning for patient. MD suggested that a CSW, RN, along with himself speak to the patient about discharging to [facility] given that it is the only bed offer available. CSW called to verify with [facility] that patient would be able to admit to the facility today if agreeable upon d/c. [name] initially reported that patient would need to sign over her disability check due to being in Copay days. This would also be due to expectation that patient's stay would require longer than than the amount allotted by insurance given patient's homelessness status and current medical needs. [name] later reported that she was informed that patient owes the facility due insurance stopping full payment past allotted number of stays during previous stay with [facility]. It was noted that patient refusal to submit bank records to apply for disability during her previous stay with [facility] in order to maintain placement with [facility]. CSW notified CSW who is meeting with patient to discuss [facility] Placement.  CSW received a returning phone call for patient's daughter. CSW updated patient's daughter on patient's discharge status and discussed  alternative options for discharge. CSW inquired if patient's daughter had reviewed homeless shelters with patient. [name] reported that she did not think it would be suitable for her mother's discharge given patient's medical needs. CSW reported that [facility] could be set up for patient to address needs while in a shelter. [name] expressed that she felt that other facilities have bed availability. CSW informed patient's daughter that [facility] was not able to require a facility to make a bed offer and that potential barriers that would explain the lack of bed offers include patient's history with non compliance, SA, and documented previous conflict with SNF staff. CSW also informed patient's daughter that patient would have to sign over her disability check once received to cover patient's boarding at [facility]. [name] expressed concern that a facility would take the check and then terminate placement. CSW informed patient's daughter that the SNF would have to identify a safe discharge plan before terminating placement. CSW informed patients daughter that because patient is in her Copay days, this would be a stipulation of any SNF unless patient had other means to cover cost. Patient now agreeable to [facility]. CSW contacted patient to inquire about ability to produce bank statements to file for disability. Patient reported that she had completed disability paperwork and received a monthly check after leaving [facility]. CSW inquired if patient would be agreeable to sign over her check. Patient reported that she did not want to sign over her check and differed decision to her daughter and brother. CSW spoke with patient's daughter and brother. [name] reported that she did not feel comfortable making that decision for her mother and differed to patient's brother.  Patients brother reported that he would support that decision so that patient would have a facility to rehab with, wanted  patient to make the ultimate decision. CSW informed patient of family support to sign over her disability check to cover the cost of [facility] Placement. Patient now agreeable to sign over disability check during stay at [facility]. CSW notified [name] with [facility]. [name] agreeable to take patient on 11/07/2020 in the morning. TOC to follow.   Expected Discharge Plan: [place] Barriers to Discharge: Continued Medical Work up  Expected Discharge Plan and Services Expected Discharge Plan: [place] In-house Referral: Clinical Social Work   Post Acute Care Choice: [place] Living arrangements for the past 2 months: Single Family Home Expected Discharge Date: 11/04/20                                     Social Determinants of Health (SDOH) Interventions    Readmission Risk Interventions Readmission Risk Prevention Plan 10/31/2020 08/27/2020  Transportation Screening Complete Complete  Medication Review Press photographer) Complete Complete  PCP or Specialist appointment within 3-5 days of discharge - Complete  HRI or [place] - Complete  SW Recovery Care/Counseling Consult Complete Complete  Palliative Care Screening Not Applicable Not Applicable  Skilled Nursing Facility Complete Complete  Some recent data might be hidden

## 2020-11-06 NOTE — Progress Notes (Signed)
Patient Demographics:    [name], is a 55 y.o. female, DOB - 02-18-1966, JAS:[id number]  Admit date - 10/29/2020   Admitting Physician Barbara Jaffe, MD  Outpatient Primary MD for the patient is Barbara Helper, MD  LOS - 8   Chief Complaint  Patient presents with  . Hypotension        Subjective:    [name] today has no fevers, no emesis,  No chest pain,   Only 1 Bm in last 12 hrs  SW [name] at bedside---pt is now agreeable to go to SNF rehab  Assessment  & Plan :    Principal Problem:   DKA (diabetic ketoacidosis) ([place]) Active Problems:   Essential hypertension   Chronic pain syndrome   Chronic combined systolic and diastolic CHF (congestive heart failure) (HCC)   COPD (chronic obstructive pulmonary disease) (HCC)   AKI (acute kidney injury) ([place])   Atrial fibrillation with RVR (HCC)   Seizures ([place])   Shock ([place])   Brief Admission History:  55 y.o.year-old w/ hx of CVA, seizures, obesity, tobacco abuse, CM EF 25%, HL, hx GIB, HTN, diabetes on insulin, Charcot foot, COPD, cocaine abuse, CKD 3, chronic syst/ diast CHF, anemia, etoh use presented to ED by EMS w/ c/o laying in bed for 2 days w/o medications. She had reportedly been vomiting and having diarrhea and has not been able to keep down her medication. EMS reported pt's boyfriend left 2 days ago and did not return, we has helping to take care of her and give her her medications. EMS BP 80/60, temp 94.5, HR 160 and CBG 414. In ED RR 31, temp 94.8, HR 145 - 162, RA 97% sat, BP 70/30 >>150/70 after cardioversion. BS >600, Na 133 CO2 17 AG 25 Glu 874 BUN 61 Cr 2.47 Ca 8.8 Alb 3.2 LFT's okay, egfr 22 WBC 17K Hb 16.6 plt 327 Lactic 2.3 >1.9 Beta-hydroxy ^ 7.16, tsh 0.5 CXR showed LLL atelectasis, noconsolidation.EKG showed wide-complex tachycardia at 181 bpm afib w/ RVR, QRS 125 msec. Repeat ekg after  cardioversion x 2 (120J, 150J) showed sinus tach at 104 bpm. In ED she was cardioverted as above and started on IV insulin drip and received IV vanc 1500 mg x 1, IV zosyn also ordered. Got 4 L total bolus IVF"s. Blood and urine cx's sent. Asked to see for admission.   Pt seen in room, lives alone, uses a walker or wheelchair mostly, needs help caring for herself. Denies any recent CP or abd pain, +n/v and diarrhea, no fevers or chills. Vague historian.  Assessment & Plan:   Principal Problem:   DKA (diabetic ketoacidosis) ([place]) Active Problems:   Essential hypertension   Chronic pain syndrome   Chronic combined systolic and diastolic CHF (congestive heart failure) (HCC)   COPD (chronic obstructive pulmonary disease) (HCC)   AKI (acute kidney injury) (HCC)   Atrial fibrillation with RVR ([place])   Seizures ([place])   Shock ([place])  1. Atrial fibrillation with RVR - pt s/p 2 cardioversions performed in ED by ED staff.  -Remains in sinus rhythm, continue metoprolol and apixaban  2)C diff infection with diarrhea --overall improved, continue oral Vanco   3)DKA--on admission, resolved, off IV insulin, continue subcu insulin  4)  generalized weakness and debilitation -PT recommended SNF placement.,  Unsteady gait, high fall risk, awaiting SNF Rehab transfer   5)Chronic combined sys/diastolic heart failure - stable, does not appear volume overloaded at this time  6)Epilepsy -continue Keppra  7)COPD - stable, no acute exacerbation at this time, no need for steroids, continue bronchodilators  8)Depression -continue [name] .   DVT prophylaxis: apixaban  Code Status: Full  Family Communication: none present  Disposition:  SNF placement  (awaiting isolation bed at SNF)  Status is: Inpatient  Remains inpatient appropriate because:IV treatments appropriate due to intensity of illness or inability to take PO and Inpatient level of care appropriate due to severity of illness  Dispo: The  patient is from: Home  Anticipated d/c is to: Home  Anticipated d/c date is: 1 day  Patient currently is medically stable to d/c.  Awaiting for SNF to have an isolation bed (due to patient having C diff infection).   Consultants:     Procedures:    Antimicrobials:  Vancomycin 1/6>>    Disposition/Need for in-Hospital Stay- patient unable to be discharged at this time due to awaiting SNF bed transfer possibly on 11/07/2020  Status is: Inpatient  Remains inpatient appropriate because:Please see above   Disposition: The patient is from: Home              Anticipated d/c is to: SNF              Anticipated d/c date is: 1 day              Patient currently is medically stable to d/c.  Code Status :  -  Code Status: Full Code   Family Communication:    NA (patient is alert, awake and coherent)   Consults  :    DVT Prophylaxis  :   - SCDs   apixaban (ELIQUIS) tablet 5 mg    Lab Results  Component Value Date   PLT 327 10/29/2020    Inpatient Medications  Scheduled Meds: . amLODipine  10 mg Oral Daily  . apixaban  5 mg Oral BID  . clopidogrel  75 mg Oral Daily  . DULoxetine  60 mg Oral Daily  . gabapentin  300 mg Oral TID  . guaiFENesin  1,200 mg Oral BID  . insulin aspart  0-15 Units Subcutaneous TID WC  . insulin aspart  0-5 Units Subcutaneous QHS  . insulin aspart  3 Units Subcutaneous TID WC  . insulin glargine  9 Units Subcutaneous Q24H  . levETIRAcetam  750 mg Oral BID  . metoprolol succinate  50 mg Oral BID  . montelukast  10 mg Oral QHS  . pantoprazole  40 mg Oral BID  . rosuvastatin  10 mg Oral Daily  . saccharomyces boulardii  250 mg Oral BID  . vancomycin  125 mg Oral QID   Continuous Infusions: PRN Meds:.acetaminophen, albuterol, hydrALAZINE, ondansetron (ZOFRAN) IV, umeclidinium bromide    Anti-infectives (From admission, onward)   Start     Dose/Rate Route Frequency Ordered Stop   11/04/20 0000   vancomycin (VANCOCIN) 50 mg/mL oral solution        125 mg Oral 4 times daily 11/04/20 1201 11/09/20 2359   11/03/20 1030  fluconazole (DIFLUCAN) tablet 150 mg        150 mg Oral  Once 11/03/20 0939 11/03/20 1320   10/31/20 1700  vancomycin (VANCOCIN) IVPB 1000 mg/200 mL premix  Status:  Discontinued  1,000 mg 200 mL/hr over 60 Minutes Intravenous Every 48 hours 10/29/20 1640 10/30/20 0828   10/30/20 1530  vancomycin (VANCOCIN) 50 mg/mL oral solution 125 mg        125 mg Oral 4 times daily 10/30/20 1434 11/09/20 1359   10/30/20 0200  piperacillin-tazobactam (ZOSYN) IVPB 3.375 g  Status:  Discontinued        3.375 g 12.5 mL/hr over 240 Minutes Intravenous Every 8 hours 10/29/20 1650 10/30/20 1602   10/29/20 1945  piperacillin-tazobactam (ZOSYN) IVPB 3.375 g  Status:  Discontinued        3.375 g 100 mL/hr over 30 Minutes Intravenous  Once 10/29/20 1936 10/29/20 1941   10/29/20 1700  piperacillin-tazobactam (ZOSYN) IVPB 3.375 g        3.375 g 100 mL/hr over 30 Minutes Intravenous  Once 10/29/20 1648 10/29/20 2007   10/29/20 1645  vancomycin (VANCOREADY) IVPB 1500 mg/300 mL        1,500 mg 150 mL/hr over 120 Minutes Intravenous  Once 10/29/20 1639 10/29/20 1950   10/29/20 1630  vancomycin (VANCOCIN) IVPB 1000 mg/200 mL premix  Status:  Discontinued        1,000 mg 200 mL/hr over 60 Minutes Intravenous  Once 10/29/20 1621 10/29/20 1639        Objective:   Vitals:   11/05/20 1300 11/05/20 1600 11/05/20 2131 11/06/20 0312  BP:  (!) 146/105 (!) 145/92 (!) 140/108  Pulse: 85  89 87  Resp: _0 Temp: 98.8 F (37.1 C)  98.7 F (37.1 C) 98.2 F (36.8 C)  TempSrc: Oral   Oral  SpO2: 97%  98% 97%  Weight:      Height:        Wt Readings from Last 3 Encounters:  10/29/20 78.9 kg  10/09/20 79.4 kg  10/01/20 78.9 kg     Intake/Output Summary (Last 24 hours) at 11/06/2020 1714 Last data filed at 11/06/2020 0841 Gross per 24 hour  Intake 480 ml  Output 325 ml  Net 155  ml    Physical Exam  Gen:- Awake Alert,  In no apparent distress  HEENT:- [place].AT, No sclera icterus Neck-Supple Neck,No JVD,.  Lungs-  CTAB , fair symmetrical air movement CV- S1, S2 normal, regular  Abd-  +ve B.Sounds, Abd Soft, No tenderness,    Extremity/Skin:- No  edema, pedal pulses present  Psych-affect is appropriate, oriented x3 Neuro-generalized weakness, no new focal deficits, no tremors   Data Review:   Micro Results Recent Results (from the past 240 hour(s))  Blood Culture (routine x 2)     Status: None   Collection Time: 10/29/20  1:38 PM   Specimen: BLOOD LEFT ARM  Result Value Ref Range Status   Specimen Description BLOOD LEFT ARM  Final   Special Requests   Final    BOTTLES DRAWN AEROBIC AND ANAEROBIC Blood Culture results may not be optimal due to an inadequate volume of blood received in culture bottles   Culture   Final    NO GROWTH 5 DAYS Performed at [facility], [address], [place], [place] [address]    Report Status 11/03/2020 FINAL  Final  Blood Culture (routine x 2)     Status: None   Collection Time: 10/29/20  3:14 PM   Specimen: BLOOD RIGHT HAND  Result Value Ref Range Status   Specimen Description BLOOD RIGHT HAND  Final   Special Requests   Final    BOTTLES DRAWN AEROBIC AND  ANAEROBIC Blood Culture results may not be optimal due to an inadequate volume of blood received in culture bottles   Culture   Final    NO GROWTH 5 DAYS Performed at [facility], [address] [place], [place] [address]    Report Status 11/03/2020 FINAL  Final  Resp Panel by RT-PCR (Flu A&B, Covid) Nasopharyngeal Swab     Status: None   Collection Time: 10/29/20  5:20 PM   Specimen: Nasopharyngeal Swab; Nasopharyngeal(NP) swabs in vial transport medium  Result Value Ref Range Status   SARS Coronavirus 2 by RT PCR NEGATIVE NEGATIVE Final    Comment: (NOTE) SARS-CoV-2 target nucleic acids are NOT DETECTED.  The SARS-CoV-2 RNA is generally detectable in upper  respiratory specimens during the acute phase of infection. The lowest concentration of SARS-CoV-2 viral copies this assay can detect is 138 copies/mL. A negative result does not preclude SARS-Cov-2 infection and should not be used as the sole basis for treatment or other patient management decisions. A negative result may occur with  improper specimen collection/handling, submission of specimen other than nasopharyngeal swab, presence of viral mutation(s) within the areas targeted by this assay, and inadequate number of viral copies(<138 copies/mL). A negative result must be combined with clinical observations, patient history, and epidemiological information. The expected result is Negative.  Fact Sheet for Patients:  EntrepreneurPulse.com.au  Fact Sheet for Healthcare Providers:  IncredibleEmployment.be  This test is no t yet approved or cleared by the [place] FDA and  has been authorized for detection and/or diagnosis of SARS-CoV-2 by FDA under an Emergency Use Authorization (EUA). This EUA will remain  in effect (meaning this test can be used) for the duration of the COVID-19 declaration under Section 564(b)(1) of the Act, 21 U.S.C.section 360bbb-3(b)(1), unless the authorization is terminated  or revoked sooner.       Influenza A by PCR NEGATIVE NEGATIVE Final   Influenza B by PCR NEGATIVE NEGATIVE Final    Comment: (NOTE) The Xpert Xpress SARS-CoV-2/FLU/RSV plus assay is intended as an aid in the diagnosis of influenza from Nasopharyngeal swab specimens and should not be used as a sole basis for treatment. Nasal washings and aspirates are unacceptable for Xpert Xpress SARS-CoV-2/FLU/RSV testing.  Fact Sheet for Patients: EntrepreneurPulse.com.au  Fact Sheet for Healthcare Providers: IncredibleEmployment.be  This test is not yet approved or cleared by the [place] FDA and has been  authorized for detection and/or diagnosis of SARS-CoV-2 by FDA under an Emergency Use Authorization (EUA). This EUA will remain in effect (meaning this test can be used) for the duration of the COVID-19 declaration under Section 564(b)(1) of the Act, 21 U.S.C. section 360bbb-3(b)(1), unless the authorization is terminated or revoked.  Performed at [facility], [address] [place], [place] [address]   Urine culture     Status: Abnormal   Collection Time: 10/29/20  8:12 PM   Specimen: In/Out Cath Urine  Result Value Ref Range Status   Specimen Description   Final    IN/OUT CATH URINE Performed at [facility], [address], [place], [place] [address]    Special Requests   Final    Immunocompromised Performed at [facility], [address] [place], [place] [address]    Culture >=100,000 COLONIES/mL STAPHYLOCOCCUS AUREUS (A)  Final   Report Status 11/01/2020 FINAL  Final   Organism ID, Bacteria STAPHYLOCOCCUS AUREUS (A)  Final      Susceptibility   Staphylococcus aureus - MIC*    CIPROFLOXACIN <=0.5 SENSITIVE  Sensitive     GENTAMICIN <=0.5 SENSITIVE Sensitive     NITROFURANTOIN <=16 SENSITIVE Sensitive     OXACILLIN <=0.25 SENSITIVE Sensitive     TETRACYCLINE <=1 SENSITIVE Sensitive     VANCOMYCIN 1 SENSITIVE Sensitive     TRIMETH/SULFA <=10 SENSITIVE Sensitive     CLINDAMYCIN <=0.25 SENSITIVE Sensitive     RIFAMPIN <=0.5 SENSITIVE Sensitive     Inducible Clindamycin NEGATIVE Sensitive     * >=100,000 COLONIES/mL STAPHYLOCOCCUS AUREUS  MRSA PCR Screening     Status: None   Collection Time: 10/30/20 12:17 AM   Specimen: Nasal Mucosa; Nasopharyngeal  Result Value Ref Range Status   MRSA by PCR NEGATIVE NEGATIVE Final    Comment:        The GeneXpert MRSA Assay (FDA approved for NASAL specimens only), is one component of a comprehensive MRSA colonization surveillance program. It is not intended to diagnose MRSA infection nor to guide or monitor treatment for MRSA  infections. Performed at [facility], [address], [place], [place] [address]   C Difficile Quick Screen w PCR reflex     Status: Abnormal   Collection Time: 10/30/20 11:15 AM   Specimen: STOOL  Result Value Ref Range Status   C Diff antigen POSITIVE (A) NEGATIVE Final   C Diff toxin POSITIVE (A) NEGATIVE Final   C Diff interpretation Toxin producing C. difficile detected.  Final    Comment: CRITICAL RESULT CALLED TO, READ BACK BY AND VERIFIED WITH: FOLEY,BRITANY _0  10/30/20 BY JONES,T Performed at [facility], [address] [place], [place] [address]   Resp Panel by RT-PCR (Flu A&B, Covid) Nasopharyngeal Swab     Status: None   Collection Time: 11/04/20 11:12 AM   Specimen: Nasopharyngeal Swab; Nasopharyngeal(NP) swabs in vial transport medium  Result Value Ref Range Status   SARS Coronavirus 2 by RT PCR NEGATIVE NEGATIVE Final    Comment: (NOTE) SARS-CoV-2 target nucleic acids are NOT DETECTED.  The SARS-CoV-2 RNA is generally detectable in upper respiratory specimens during the acute phase of infection. The lowest concentration of SARS-CoV-2 viral copies this assay can detect is 138 copies/mL. A negative result does not preclude SARS-Cov-2 infection and should not be used as the sole basis for treatment or other patient management decisions. A negative result may occur with  improper specimen collection/handling, submission of specimen other than nasopharyngeal swab, presence of viral mutation(s) within the areas targeted by this assay, and inadequate number of viral copies(<138 copies/mL). A negative result must be combined with clinical observations, patient history, and epidemiological information. The expected result is Negative.  Fact Sheet for Patients:  EntrepreneurPulse.com.au  Fact Sheet for Healthcare Providers:  IncredibleEmployment.be  This test is no t yet approved or cleared by the [place] FDA and  has been  authorized for detection and/or diagnosis of SARS-CoV-2 by FDA under an Emergency Use Authorization (EUA). This EUA will remain  in effect (meaning this test can be used) for the duration of the COVID-19 declaration under Section 564(b)(1) of the Act, 21 U.S.C.section 360bbb-3(b)(1), unless the authorization is terminated  or revoked sooner.       Influenza A by PCR NEGATIVE NEGATIVE Final   Influenza B by PCR NEGATIVE NEGATIVE Final    Comment: (NOTE) The Xpert Xpress SARS-CoV-2/FLU/RSV plus assay is intended as an aid in the diagnosis of influenza from Nasopharyngeal swab specimens and should not be used as a sole basis for treatment. Nasal washings and aspirates are unacceptable for Xpert Xpress SARS-CoV-2/FLU/RSV  testing.  Fact Sheet for Patients: EntrepreneurPulse.com.au  Fact Sheet for Healthcare Providers: IncredibleEmployment.be  This test is not yet approved or cleared by the [place] FDA and has been authorized for detection and/or diagnosis of SARS-CoV-2 by FDA under an Emergency Use Authorization (EUA). This EUA will remain in effect (meaning this test can be used) for the duration of the COVID-19 declaration under Section 564(b)(1) of the Act, 21 U.S.C. section 360bbb-3(b)(1), unless the authorization is terminated or revoked.  Performed at [facility], [address], [place], [place] [address]     Radiology Reports CT ABDOMEN PELVIS WO CONTRAST  Result Date: 10/29/2020 CLINICAL DATA:  Nausea vomiting EXAM: CT ABDOMEN AND PELVIS WITHOUT CONTRAST TECHNIQUE: Multidetector CT imaging of the abdomen and pelvis was performed following the standard protocol without IV contrast. COMPARISON:  Radiograph 08/21/2020, CT 07/12/2018 FINDINGS: Lower chest: Lung bases demonstrate small bilateral pleural effusions. Hazy posterior lung bases likely atelectasis. Cardiomegaly. Mild circumferential distal esophageal thickening. Hepatobiliary:  No focal liver abnormality is seen. No gallstones, gallbladder wall thickening, or biliary dilatation. Pancreas: Unremarkable. No pancreatic ductal dilatation or surrounding inflammatory changes. Spleen: Normal in size without focal abnormality. Adrenals/Urinary Tract: Adrenal glands are within normal limits. Kidneys show no hydronephrosis. Nonspecific perinephric fat stranding. Markedly distended urinary bladder. Stomach/Bowel: The stomach is nonenlarged. No dilated small bowel. Negative appendix. No acute bowel wall thickening. Vascular/Lymphatic: Moderate aortic atherosclerosis. No aneurysm. No suspicious nodes. Reproductive: Uterus is unremarkable.  No adnexal mass. Other: Negative for free air or free fluid. Musculoskeletal: Moderate superior endplate compression deformity at L1 with about 40% loss of height of anterior vertebral body. Degenerative changes most advanced at L3-L4 and L5-S1. IMPRESSION: 1. No CT evidence for acute intra-abdominal or pelvic abnormality. 2. Mild distal esophageal thickening, question esophagitis or reflux. 3. Small bilateral pleural effusions. Cardiomegaly. 4. Moderate superior endplate compression deformity at L1, of uncertain age, new since 2019. Aortic Atherosclerosis (ICD10-I70.0). Electronically Signed   By: Donavan Foil M.D.   On: 10/29/2020 18:57   DG CHEST PORT 1 VIEW  Result Date: 11/02/2020 CLINICAL DATA:  Chest pain when swallowing water EXAM: PORTABLE CHEST 1 VIEW COMPARISON:  10/29/2020 FINDINGS: Cardiomegaly and aortic tortuosity. Stable lung markings. There is no edema, consolidation, effusion, or pneumothorax. IMPRESSION: 1. No acute finding when compared to prior. 2. Cardiomegaly. Electronically Signed   By: Monte Fantasia M.D.   On: 11/02/2020 04:36   DG Chest Port 1 View  Result Date: 10/29/2020 CLINICAL DATA:  Questionable sepsis. EXAM: PORTABLE CHEST 1 VIEW COMPARISON:  August 19, 2020. FINDINGS: Enlarged cardiac silhouette, similar to prior. No  confluent consolidation. Streaky left basilar opacity. No visible pleural effusions or pneumothorax on this semi upright portable radiograph. No acute osseous abnormality. IMPRESSION: Streaky left basilar opacity, favored to reflect atelectasis. No confluent consolidation. Electronically Signed   By: Margaretha Sheffield MD   On: 10/29/2020 15:04     CBC No results for input(s): WBC, HGB, HCT, PLT, MCV, MCH, MCHC, RDW, LYMPHSABS, MONOABS, EOSABS, BASOSABS, BANDABS in the last 168 hours.  Invalid input(s): NEUTRABS, BANDSABD  Chemistries  Recent Labs  Lab 10/31/20 0548 11/01/20 0400  NA 142 141  K 3.9 4.3  CL 113* 115*  CO2 20* 19*  GLUCOSE 198* 247*  BUN 38* 29*  CREATININE 1.65* 1.42*  CALCIUM 7.8* 7.9*  MG 1.5* 2.3   ------------------------------------------------------------------------------------------------------------------ No results for input(s): CHOL, HDL, LDLCALC, TRIG, CHOLHDL, LDLDIRECT in the last 72 hours.  Lab Results  Component Value Date  HGBA1C 13.0 (H) 10/29/2020   ------------------------------------------------------------------------------------------------------------------ No results for input(s): TSH, T4TOTAL, T3FREE, THYROIDAB in the last 72 hours.  Invalid input(s): FREET3 ------------------------------------------------------------------------------------------------------------------ No results for input(s): VITAMINB12, FOLATE, FERRITIN, TIBC, IRON, RETICCTPCT in the last 72 hours.  Coagulation profile No results for input(s): INR, PROTIME in the last 168 hours.  No results for input(s): DDIMER in the last 72 hours.  Cardiac Enzymes No results for input(s): CKMB, TROPONINI, MYOGLOBIN in the last 168 hours.  Invalid input(s): CK ------------------------------------------------------------------------------------------------------------------    Component Value Date/Time   BNP 835.0 (H) 06/22/2017 0867     Roxan Hockey M.D on  11/06/2020 at 5:14 PM  Go to [email or web address] - for contact info  [facility] - Office  [phone]

## 2020-11-07 DIAGNOSIS — I10 Essential (primary) hypertension: Secondary | ICD-10-CM | POA: Diagnosis not present

## 2020-11-07 DIAGNOSIS — E1110 Type 2 diabetes mellitus with ketoacidosis without coma: Secondary | ICD-10-CM | POA: Diagnosis not present

## 2020-11-07 DIAGNOSIS — I4891 Unspecified atrial fibrillation: Secondary | ICD-10-CM | POA: Diagnosis not present

## 2020-11-07 LAB — GLUCOSE, CAPILLARY
Glucose-Capillary: 192 mg/dL — ABNORMAL HIGH (ref 70–99)
Glucose-Capillary: 162 mg/dL — ABNORMAL HIGH (ref 70–99)
Glucose-Capillary: 166 mg/dL — ABNORMAL HIGH (ref 70–99)
Glucose-Capillary: 176 mg/dL — ABNORMAL HIGH (ref 70–99)
Glucose-Capillary: 191 mg/dL — ABNORMAL HIGH (ref 70–99)

## 2020-11-07 MED ORDER — INSULIN GLARGINE 100 UNIT/ML ~~LOC~~ SOLN: 9.0000 [IU] | SUBCUTANEOUS | 11 refills | Status: DC

## 2020-11-07 MED ORDER — DULOXETINE HCL 60 MG PO CPEP: 60.0000 mg | ORAL_CAPSULE | Freq: Every day | ORAL | 3 refills | Status: DC

## 2020-11-07 MED ORDER — FUROSEMIDE 20 MG PO TABS: 20.0000 mg | ORAL_TABLET | Freq: Every day | ORAL | 2 refills | Status: DC

## 2020-11-07 MED ORDER — VANCOMYCIN 50 MG/ML ORAL SOLUTION: 125.0000 mg | Freq: Four times a day (QID) | ORAL | 0 refills | Status: DC

## 2020-11-07 MED ORDER — INSULIN ASPART 100 UNIT/ML FLEXPEN: 0.0000 [IU] | PEN_INJECTOR | Freq: Three times a day (TID) | SUBCUTANEOUS | 11 refills | Status: DC

## 2020-11-07 MED ORDER — ALUM & MAG HYDROXIDE-SIMETH 200-200-20 MG/5ML PO SUSP
30.0000 mL | Freq: Four times a day (QID) | ORAL | Status: DC | PRN
  Administered 2020-11-07: 30 mL via ORAL
  Filled 2020-11-07: qty 30

## 2020-11-07 NOTE — TOC Progression Note (Signed)
Transition of Care [facility]) - Progression Note    Patient Details  Name: [name] MRN: [id number] Date of Birth: [birth year]  [facility] CM/SW Contact  Shade Flood, LCSW Phone Number: 11/07/2020, 3:15 PM  Clinical Narrative:     TOC following. Per [name] at [facility], they are still working to verify pt's disability income and they do not have a quarantine bed for pt today anyway because they have residents newly diagnosed with covid that need quarantine now. She is hoping they can take pt tomorrow. Updated RN and MD. Donella Stade will follow.  Expected Discharge Plan: [place] Barriers to Discharge: Continued Medical Work up  Expected Discharge Plan and Services Expected Discharge Plan: [place] In-house Referral: Clinical Social Work   Post Acute Care Choice: [place] Living arrangements for the past 2 months: Single Family Home Expected Discharge Date: 11/07/20                                     Social Determinants of Health (SDOH) Interventions    Readmission Risk Interventions Readmission Risk Prevention Plan 10/31/2020 08/27/2020  Transportation Screening Complete Complete  Medication Review Press photographer) Complete Complete  PCP or Specialist appointment within 3-5 days of discharge - Complete  HRI or [place] - Complete  SW Recovery Care/Counseling Consult Complete Complete  Palliative Care Screening Not Applicable Not Applicable  Skilled Nursing Facility Complete Complete  Some recent data might be hidden

## 2020-11-07 NOTE — Care Management Important Message (Deleted)
Important Message  Patient Details  Name: [name] MRN: [id number] Date of Birth: 01/09/1966   Medicare Important Message Given:  Yes     Tommy Medal 11/07/2020, 12:09 PM

## 2020-11-07 NOTE — Discharge Summary (Addendum)
[name], is a 55 y.o. female  DOB [birth year]  MRN [id number].  Admission date:  10/29/2020  Admitting Physician  Roney Jaffe, MD  Discharge Date:  11/08/2020   Primary MD  [name], MD  Recommendations for primary care physician for things to follow:   -1)You are taking Eliquis/apixaban and Plavix/clopidogrel which are blood thinners, so Please Avoid ibuprofen/Advil/Aleve/Motrin/Goody Powders/Naproxen/BC powders/Meloxicam/Diclofenac/Indomethacin and other Nonsteroidal anti-inflammatory medications as these will make you more likely to bleed and can cause stomach ulcers, can also cause Kidney problems. 2) CBC every Monday starting 11/11/2020 for the next 3 weeks advised 3) NovoLog sliding scale insulin as below:- insulin aspart (novoLOG) injection 0-10 Units 0-10 Units Subcutaneous, 3 times daily with meals CBG < 70: Implement Hypoglycemia Standing Orders and refer to Hypoglycemia Standing Orders sidebar report  CBG 70 - 120: 0 unit CBG 121 - 150: 0 unit  CBG 151 - 200: 1 unit CBG 201 - 250: 2 units CBG 251 - 300: 4 units CBG 301 - 350: 6 units  CBG 351 - 400: 8 units  CBG > 400: 10 units   Admission Diagnosis  DKA (diabetic ketoacidosis) (HCC) [E11.10] Hyperglycemia [R73.9] AKI (acute kidney injury) ([place]) [N17.9] Disoriented to time [Z78.9] Atrial fibrillation, unspecified type ([place]) [I48.91] Nausea and vomiting, intractability of vomiting not specified, unspecified vomiting type [R11.2]   Discharge Diagnosis  DKA (diabetic ketoacidosis) ([place]) [E11.10] Hyperglycemia [R73.9] AKI (acute kidney injury) ([place]) [N17.9] Disoriented to time [Z78.9] Atrial fibrillation, unspecified type ([place]) [I48.91] Nausea and vomiting, intractability of vomiting not specified, unspecified vomiting type [R11.2]    Principal Problem:   DKA (diabetic ketoacidosis) ([place]) Active Problems:   Essential  hypertension   Chronic pain syndrome   Chronic combined systolic and diastolic CHF (congestive heart failure) (HCC)   COPD (chronic obstructive pulmonary disease) (HCC)   AKI (acute kidney injury) ([place])   Atrial fibrillation with RVR ([place])   Seizures ([place])   Shock ([place])      Past Medical History:  Diagnosis Date  . Alcohol use   . Ankle fracture, lateral malleolus, closed 12/30/2011  . Anxiety   . Breast mass, left 12/15/2011  . Chronic anemia   . Chronic combined systolic and diastolic CHF (congestive heart failure) ([place])   . CKD (chronic kidney disease), stage III ([place])    pt denies  . Cocaine abuse ([place])   . COPD (chronic obstructive pulmonary disease) ([place])   . Diabetes mellitus, type 2 ([place])   . Diabetic Charcot foot ([place]) 12/30/2011  . Essential hypertension   . History of GI bleed 12/05/2009   Qualifier: Diagnosis of  By: Zeb Comfort    . Hyperlipidemia   . Hypertensive cardiomyopathy ([place]) 11/08/2012   a. 05/2016: echo showing EF of 20-25% with normal cors by cath in 07/2016.  . Morbid obesity ([place]) 01/31/2008   Qualifier: Diagnosis of  By: Lenn Cal    . Noncompliance with medication regimen   . Obesity   . Panic attacks   . PAT (paroxysmal  atrial tachycardia) ([place])    a. started on amiodarone 07/2017 for this.  . Seizures ([place]) 10/01/2020  . Sleep apnea    does not use cpap  . Stroke ([place])    06/24/17  . Tobacco abuse   . Urinary incontinence     Past Surgical History:  Procedure Laterality Date  . BREAST BIOPSY    . CARDIAC CATHETERIZATION N/A 07/28/2016   Procedure: Left Heart Cath and Coronary Angiography;  Surgeon: Jettie Booze, MD;  Location: [facility];  Service: Cardiovascular;  Laterality: N/A;  . COLONOSCOPY N/A 05/10/2019   Procedure: COLONOSCOPY;  Surgeon: Danie Binder, MD;  [facility];  Service: Endoscopy;  Laterality: N/A;  Phenergan 12.5 mg IV in pre-op  . DILATION AND CURETTAGE OF UTERUS    . I & D EXTREMITY  Bilateral 09/22/2017   Procedure: BILATERAL DEBRIDEMENT LEG/FOOT ULCERS, APPLY VERAFLO WOUND VAC;  Surgeon: Newt Minion, MD;  Location: [facility];  Service: Orthopedics;  Laterality: Bilateral;  . I & D EXTREMITY Right 10/11/2018   Procedure: IRRIGATION AND DEBRIDEMENT RIGHT HAND;  Surgeon: [name], MD;  Location: [facility];  Service: Orthopedics;  Laterality: Right;  . I & D EXTREMITY Right 10/13/2018   Procedure: REPEAT IRRIGATION AND DEBRIDEMENT RIGHT HAND;  Surgeon: [name], MD;  Location: [facility];  Service: Orthopedics;  Laterality: Right;  . I & D EXTREMITY Right 11/22/2018   Procedure: IRRIGATION AND DEBRIDEMENT AND PINNING RIGHT HAND;  Surgeon: [name], MD;  Location: [facility];  Service: Orthopedics;  Laterality: Right;  . IR RADIOLOGIST EVAL & MGMT  07/05/2018  . POLYPECTOMY  05/10/2019   Procedure: POLYPECTOMY;  Surgeon: Danie Binder, MD;  [facility];  Service: Endoscopy;;  . SKIN SPLIT GRAFT Bilateral 09/28/2017   Procedure: BILATERAL SPLIT THICKNESS SKIN GRAFT LEGS/FEET AND APPLY VAC;  Surgeon: Newt Minion, MD;  Location: [facility];  Service: Orthopedics;  Laterality: Bilateral;  . SKIN SPLIT GRAFT Right 11/22/2018   Procedure: SKIN GRAFT SPLIT THICKNESS;  Surgeon: [name], MD;  Location: [facility];  Service: Orthopedics;  Laterality: Right;     HPI  from the history and physical done on the day of admission:    Chief Complaint:  HPI: The patient is a 55 y.o. year-old w/ hx of CVA, seizures, obesity, tobacco abuse, CM EF 25%, HL, hx GIB, HTN, diabetes on insulin, Charcot foot, COPD, cocaine abuse, CKD 3, chronic syst/ diast CHF, anemia, etoh use presented to ED by EMS w/ c/o laying in bed for 2 days w/o medications.  She had reportedly been vomiting and having diarrhea and has not been able to keep down her medication. EMS reported pt's boyfriend left 2 days ago and did not return, we has helping to take care of her and give her her medications. EMS BP  80/60, temp 94.5, HR 160 and CBG 414. In ED RR 31, temp 94.8, HR 145 - 162, RA 97% sat, BP 70/30 >> 150/70 after cardioversion. BS > 600, Na 133 CO2 17  AG 25  Glu 874  BUN 61  Cr 2.47  Ca 8.8  Alb 3.2  LFT's okay, egfr 22  WBC 17K  Hb 16.6  plt 327  Lactic 2.3 > 1.9  Beta-hydroxy ^ 7.16, tsh 0.5   CXR showed LLL atelectasis, no consolidation. EKG showed wide-complex tachycardia at 181 bpm afib w/ RVR, QRS 125 msec. Repeat ekg after cardioversion x 2 (120J, 150J) showed sinus tach  at 104 bpm.  In ED she was cardioverted as above and started on IV insulin drip and received IV vanc 1500 mg x 1, IV zosyn also ordered. Got 4 L total bolus IVF"s. Blood and urine cx's sent.  Asked to see for admission.   Pt seen in room, lives alone, uses a walker or wheelchair mostly, needs help caring for herself.  Denies any recent CP or abd pain, +n/v and diarrhea, no fevers or chills.  Vague historian.     Hospital Course:      Brief Admission History: 55 y.o.year-old w/ hx of CVA, seizures, obesity, tobacco abuse, CM EF 25%, HL, hx GIB, HTN, diabetes on insulin, Charcot foot, COPD, cocaine abuse, CKD 3, chronic syst/ diast CHF, anemia, etoh use presented to ED by EMS w/ c/o laying in bed for 2 days w/o medications. She had reportedly been vomiting and having diarrhea and has not been able to keep down her medication. EMS reported pt's boyfriend left 2 days ago and did not return, we has helping to take care of her and give her her medications. EMS BP 80/60, temp 94.5, HR 160 and CBG 414. In ED RR 31, temp 94.8, HR 145 - 162, RA 97% sat, BP 70/30 >>150/70 after cardioversion. BS >600, Na 133 CO2 17 AG 25 Glu 874 BUN 61 Cr 2.47 Ca 8.8 Alb 3.2 LFT's okay, egfr 22 WBC 17K Hb 16.6 plt 327 Lactic 2.3 >1.9 Beta-hydroxy ^ 7.16, tsh 0.5 CXR showed LLL atelectasis, noconsolidation.EKG showed wide-complex tachycardia at 181 bpm afib w/ RVR, QRS 125 msec. Repeat ekg after cardioversion x 2 (120J, 150J) showed  sinus tach at 104 bpm. In ED she was cardioverted as above and started on IV insulin drip and received IV vanc 1500 mg x 1, IV zosyn also ordered. Got 4 L total bolus IVF"s. Blood and urine cx's sent. Asked to see for admission.   Pt seen in room, lives alone, uses a walker or wheelchair mostly, needs help caring for herself. Denies any recent CP or abd pain, +n/v and diarrhea, no fevers or chills. Vague historian.  Assessment & Plan:  Principal Problem: DKA (diabetic ketoacidosis) ([place]) Active Problems: Essential hypertension Chronic pain syndrome Chronic combined systolic and diastolic CHF (congestive heart failure) (HCC) COPD (chronic obstructive pulmonary disease) (HCC) AKI (acute kidney injury) (HCC) Atrial fibrillation with RVR (HCC) Seizures (HCC) Shock ([place])  A/p 1)Atrial fibrillation with RVR - pt s/p 2 cardioversions performed in ED by ED staff. -Remains in sinus rhythm, continue metoprolol for rate control and apixaban for stroke prophylaxis  2)C diff infection with diarrhea --overall much improved, continue oral Vanco, Rx given  3)DKA--on admission, resolved, off IV insulin, continue subcu scheduled Lantus insulin along with NovoLog sliding scale  4) generalized weakness and debilitation -PT recommended SNF placement.,  Unsteady gait, high fall risk, pt agres to  SNF Rehab transfer  5)Chronic combined sys/diastolic heart failure - stable,  okay to use low-dose Lasix 20 mg daily =-Echo with EF of 50 to 55%  6)Epilepsy -stable, continue Keppra  7)COPD - stable, no acute exacerbation at this time, no need for steroids, continue bronchodilators  8)Depression -continue Cymbalta .  DVT prophylaxis:apixaban  Code Status:Full  Family Communication:none present  Disposition:SNF placement (awaiting isolation bed at SNF)  Status is: Inpatient  Remains inpatient appropriate because:IV treatments appropriate due to intensity of  illness or inability to take PO and Inpatient level of care appropriate due to severity of illness  Dispo: The patient is  from: Home Anticipated d/c is to: Home Anticipated d/c date is: 1 day Patient currentlyis medically stable to d/c.Transfer to  SNF to have an isolation bed (due to patient having C diff infection).  Consultants:    Procedures:   Antimicrobials:  Vancomycin 1/6>>   Disposition/Need for in-Hospital Stay- patient unable to be discharged at this time due to awaiting SNF bed transfer possibly on 11/07/2020  Status is: Inpatient  Remains inpatient appropriate because:Please see above   Disposition: The patient is from: Home  Anticipated d/c is to: SNF  Anticipated d/c date is: 1 day  Patient currently is medically stable to d/c.  Code Status :  -  Code Status: Full Code   Family Communication:     (patient is alert, awake and coherent)  --Attempted to reach patient's son Mr. Sindy Guadeloupe at [phone] answer, voicemail is full  Consults  :    DVT Prophylaxis  :   - SCDs   apixaban (ELIQUIS) tablet 5 mg    Discharge Condition: stable  Follow UP   Contact information for after-discharge care    Destination    [facility] Preferred SNF .   Service: Skilled Nursing Contact information: [address] [place] [phone]                  Diet and Activity recommendation:  As advised  Discharge Instructions    Discharge Instructions    Call MD for:  difficulty breathing, headache or visual disturbances   Complete by: As directed    Call MD for:  persistant dizziness or light-headedness   Complete by: As directed    Call MD for:  persistant nausea and vomiting   Complete by: As directed    Call MD for:  temperature >100.4   Complete by: As directed    Diet - low sodium heart healthy   Complete  by: As directed    Diet Carb Modified   Complete by: As directed    Discharge instructions   Complete by: As directed    1)You are taking Eliquis/apixaban and Plavix/clopidogrel which are blood thinners, so Please Avoid ibuprofen/Advil/Aleve/Motrin/Goody Powders/Naproxen/BC powders/Meloxicam/Diclofenac/Indomethacin and other Nonsteroidal anti-inflammatory medications as these will make you more likely to bleed and can cause stomach ulcers, can also cause Kidney problems. 2) CBC and BMP Blood Tests every Monday starting 11/11/2020 for the next 3 weeks advised 3) NovoLog sliding scale insulin as below:- insulin aspart (novoLOG) injection 0-10 Units 0-10 Units Subcutaneous, 3 times daily with meals CBG < 70: Implement Hypoglycemia Standing Orders and refer to Hypoglycemia Standing Orders sidebar report  CBG 70 - 120: 0 unit CBG 121 - 150: 0 unit  CBG 151 - 200: 1 unit CBG 201 - 250: 2 units CBG 251 - 300: 4 units CBG 301 - 350: 6 units  CBG 351 - 400: 8 units  CBG > 400: 10 units   Increase activity slowly   Complete by: As directed        Discharge Medications     Allergies as of 11/08/2020   No Known Allergies     Medication List    STOP taking these medications   insulin glargine 100 unit/mL Sopn Commonly known as: LANTUS Replaced by: insulin glargine 100 UNIT/ML injection   olmesartan 40 MG tablet Commonly known as: BENICAR     TAKE these medications   acetaminophen 325 MG tablet Commonly known as: TYLENOL Take 2 tablets (650 mg total) by mouth every  6 (six) hours as needed for headache, fever or mild pain.   albuterol 108 (90 Base) MCG/ACT inhaler Commonly known as: ProAir HFA INHALE 2 PUFFS EVERY 6 HOURS AS NEEDED FOR SHORTNESS OF BREATH/WHEEZING. What changed:   how much to take  how to take this  when to take this  reasons to take this  additional instructions   amLODipine 10 MG tablet Commonly known as: NORVASC TAKE 1 TABLET BY MOUTH ONCE A DAY.   cloNIDine  0.2 MG tablet Commonly known as: Catapres Take 1 tablet (0.2 mg total) by mouth 2 (two) times daily. What changed:   medication strength  how much to take   clopidogrel 75 MG tablet Commonly known as: PLAVIX TAKE 1 TABLET BY MOUTH DAILY WITH BREAKFAST. What changed:   when to take this  additional instructions   DULoxetine 60 MG capsule Commonly known as: CYMBALTA Take 1 capsule (60 mg total) by mouth daily. What changed: medication strength   Eliquis 5 MG Tabs tablet Generic drug: apixaban Take 5 mg by mouth 2 (two) times daily.   furosemide 20 MG tablet Commonly known as: Lasix Take 1 tablet (20 mg total) by mouth daily. For fluid   gabapentin 300 MG capsule Commonly known as: NEURONTIN TAKE 1 CAPSULE BY MOUTH THREE TIMES A DAY.   guaiFENesin 600 MG 12 hr tablet Commonly known as: MUCINEX Take 2 tablets (1,200 mg total) by mouth 2 (two) times daily for 5 days.   insulin aspart 100 UNIT/ML FlexPen Commonly known as: NOVOLOG Inject 0-10 Units into the skin 3 (three) times daily with meals. insulin aspart (novoLOG) injection 0-10 Units 0-10 Units Subcutaneous, 3 times daily with meals CBG < 70: Implement Hypoglycemia Standing Orders and refer to Hypoglycemia Standing Orders sidebar report  CBG 70 - 120: 0 unit CBG 121 - 150: 0 unit  CBG 151 - 200: 1 unit CBG 201 - 250: 2 units CBG 251 - 300: 4 units CBG 301 - 350: 6 units  CBG 351 - 400: 8 units  CBG > 400: 10 units   insulin glargine 100 UNIT/ML injection Commonly known as: LANTUS Inject 0.09 mLs (9 Units total) into the skin daily. Replaces: insulin glargine 100 unit/mL Sopn   levETIRAcetam 750 MG tablet Commonly known as: KEPPRA Take 1 tablet (750 mg total) by mouth 2 (two) times daily.   metoprolol succinate 50 MG 24 hr tablet Commonly known as: TOPROL-XL TAKE 1 TABLET BY MOUTH TWICE A DAY. What changed: when to take this   montelukast 10 MG tablet Commonly known as: SINGULAIR TAKE (1) TABLET BY MOUTH AT  BEDTIME. What changed: See the new instructions.   oxybutynin 5 MG 24 hr tablet Commonly known as: DITROPAN-XL Take 5 mg by mouth at bedtime.   pantoprazole 40 MG tablet Commonly known as: PROTONIX Take 1 tablet (40 mg total) by mouth daily.   rosuvastatin 10 MG tablet Commonly known as: CRESTOR TAKE 1 TABLET BY MOUTH ONCE A DAY.   saccharomyces boulardii 250 MG capsule Commonly known as: FLORASTOR Take 1 capsule (250 mg total) by mouth 2 (two) times daily for 14 days.   sitaGLIPtin 100 MG tablet Commonly known as: JANUVIA Take 100 mg by mouth daily.   Spiriva HandiHaler 18 MCG inhalation capsule Generic drug: tiotropium Place 18 mcg into inhaler and inhale daily as needed (Shortness of breath).   traZODone 50 MG tablet Commonly known as: DESYREL TAKE (1) TABLET BY MOUTH AT BEDTIME. What changed: See the new instructions.  vancomycin 50 mg/mL  oral solution Commonly known as: VANCOCIN Take 2.5 mLs (125 mg total) by mouth 4 (four) times daily for 5 days.      Major procedures and Radiology Reports - PLEASE review detailed and final reports for all details, in brief -  CT ABDOMEN PELVIS WO CONTRAST  Result Date: 10/29/2020 CLINICAL DATA:  Nausea vomiting EXAM: CT ABDOMEN AND PELVIS WITHOUT CONTRAST TECHNIQUE: Multidetector CT imaging of the abdomen and pelvis was performed following the standard protocol without IV contrast. COMPARISON:  Radiograph 08/21/2020, CT 07/12/2018 FINDINGS: Lower chest: Lung bases demonstrate small bilateral pleural effusions. Hazy posterior lung bases likely atelectasis. Cardiomegaly. Mild circumferential distal esophageal thickening. Hepatobiliary: No focal liver abnormality is seen. No gallstones, gallbladder wall thickening, or biliary dilatation. Pancreas: Unremarkable. No pancreatic ductal dilatation or surrounding inflammatory changes. Spleen: Normal in size without focal abnormality. Adrenals/Urinary Tract: Adrenal glands are within normal  limits. Kidneys show no hydronephrosis. Nonspecific perinephric fat stranding. Markedly distended urinary bladder. Stomach/Bowel: The stomach is nonenlarged. No dilated small bowel. Negative appendix. No acute bowel wall thickening. Vascular/Lymphatic: Moderate aortic atherosclerosis. No aneurysm. No suspicious nodes. Reproductive: Uterus is unremarkable.  No adnexal mass. Other: Negative for free air or free fluid. Musculoskeletal: Moderate superior endplate compression deformity at L1 with about 40% loss of height of anterior vertebral body. Degenerative changes most advanced at L3-L4 and L5-S1. IMPRESSION: 1. No CT evidence for acute intra-abdominal or pelvic abnormality. 2. Mild distal esophageal thickening, question esophagitis or reflux. 3. Small bilateral pleural effusions. Cardiomegaly. 4. Moderate superior endplate compression deformity at L1, of uncertain age, new since 2019. Aortic Atherosclerosis (ICD10-I70.0). Electronically Signed   By: Donavan Foil M.D.   On: 10/29/2020 18:57   DG CHEST PORT 1 VIEW  Result Date: 11/02/2020 CLINICAL DATA:  Chest pain when swallowing water EXAM: PORTABLE CHEST 1 VIEW COMPARISON:  10/29/2020 FINDINGS: Cardiomegaly and aortic tortuosity. Stable lung markings. There is no edema, consolidation, effusion, or pneumothorax. IMPRESSION: 1. No acute finding when compared to prior. 2. Cardiomegaly. Electronically Signed   By: Monte Fantasia M.D.   On: 11/02/2020 04:36   DG Chest Port 1 View  Result Date: 10/29/2020 CLINICAL DATA:  Questionable sepsis. EXAM: PORTABLE CHEST 1 VIEW COMPARISON:  August 19, 2020. FINDINGS: Enlarged cardiac silhouette, similar to prior. No confluent consolidation. Streaky left basilar opacity. No visible pleural effusions or pneumothorax on this semi upright portable radiograph. No acute osseous abnormality. IMPRESSION: Streaky left basilar opacity, favored to reflect atelectasis. No confluent consolidation. Electronically Signed   By:  Margaretha Sheffield MD   On: 10/29/2020 15:04   Micro Results   Recent Results (from the past 240 hour(s))  Blood Culture (routine x 2)     Status: None   Collection Time: 10/29/20  1:38 PM   Specimen: BLOOD LEFT ARM  Result Value Ref Range Status   Specimen Description BLOOD LEFT ARM  Final   Special Requests   Final    BOTTLES DRAWN AEROBIC AND ANAEROBIC Blood Culture results may not be optimal due to an inadequate volume of blood received in culture bottles   Culture   Final    NO GROWTH 5 DAYS Performed at [facility], [address] [place], [place] [address]    Report Status 11/03/2020 FINAL  Final  Blood Culture (routine x 2)     Status: None   Collection Time: 10/29/20  3:14 PM   Specimen: BLOOD RIGHT HAND  Result Value Ref Range Status   Specimen Description  BLOOD RIGHT HAND  Final   Special Requests   Final    BOTTLES DRAWN AEROBIC AND ANAEROBIC Blood Culture results may not be optimal due to an inadequate volume of blood received in culture bottles   Culture   Final    NO GROWTH 5 DAYS Performed at [facility], [address] [place], [place] [address]    Report Status 11/03/2020 FINAL  Final  Resp Panel by RT-PCR (Flu A&B, Covid) Nasopharyngeal Swab     Status: None   Collection Time: 10/29/20  5:20 PM   Specimen: Nasopharyngeal Swab; Nasopharyngeal(NP) swabs in vial transport medium  Result Value Ref Range Status   SARS Coronavirus 2 by RT PCR NEGATIVE NEGATIVE Final    Comment: (NOTE) SARS-CoV-2 target nucleic acids are NOT DETECTED.  The SARS-CoV-2 RNA is generally detectable in upper respiratory specimens during the acute phase of infection. The lowest concentration of SARS-CoV-2 viral copies this assay can detect is 138 copies/mL. A negative result does not preclude SARS-Cov-2 infection and should not be used as the sole basis for treatment or other patient management decisions. A negative result may occur with  improper specimen collection/handling,  submission of specimen other than nasopharyngeal swab, presence of viral mutation(s) within the areas targeted by this assay, and inadequate number of viral copies(<138 copies/mL). A negative result must be combined with clinical observations, patient history, and epidemiological information. The expected result is Negative.  Fact Sheet for Patients:  EntrepreneurPulse.com.au  Fact Sheet for Healthcare Providers:  IncredibleEmployment.be  This test is no t yet approved or cleared by the [place] FDA and  has been authorized for detection and/or diagnosis of SARS-CoV-2 by FDA under an Emergency Use Authorization (EUA). This EUA will remain  in effect (meaning this test can be used) for the duration of the COVID-19 declaration under Section 564(b)(1) of the Act, 21 U.S.C.section 360bbb-3(b)(1), unless the authorization is terminated  or revoked sooner.       Influenza A by PCR NEGATIVE NEGATIVE Final   Influenza B by PCR NEGATIVE NEGATIVE Final    Comment: (NOTE) The Xpert Xpress SARS-CoV-2/FLU/RSV plus assay is intended as an aid in the diagnosis of influenza from Nasopharyngeal swab specimens and should not be used as a sole basis for treatment. Nasal washings and aspirates are unacceptable for Xpert Xpress SARS-CoV-2/FLU/RSV testing.  Fact Sheet for Patients: EntrepreneurPulse.com.au  Fact Sheet for Healthcare Providers: IncredibleEmployment.be  This test is not yet approved or cleared by the [place] FDA and has been authorized for detection and/or diagnosis of SARS-CoV-2 by FDA under an Emergency Use Authorization (EUA). This EUA will remain in effect (meaning this test can be used) for the duration of the COVID-19 declaration under Section 564(b)(1) of the Act, 21 U.S.C. section 360bbb-3(b)(1), unless the authorization is terminated or revoked.  Performed at [facility], [address], [place], [place] [address]   Urine culture     Status: Abnormal   Collection Time: 10/29/20  8:12 PM   Specimen: In/Out Cath Urine  Result Value Ref Range Status   Specimen Description   Final    IN/OUT CATH URINE Performed at [facility], [address] [place], [place] [address]    Special Requests   Final    Immunocompromised Performed at [facility], [address], [place],  [address]    Culture >=100,000 COLONIES/mL STAPHYLOCOCCUS AUREUS (A)  Final   Report Status 11/01/2020 FINAL  Final   Organism ID, Bacteria STAPHYLOCOCCUS AUREUS (A)  Final      Susceptibility   Staphylococcus aureus - MIC*    CIPROFLOXACIN <=0.5 SENSITIVE Sensitive     GENTAMICIN <=0.5 SENSITIVE Sensitive     NITROFURANTOIN <=16 SENSITIVE Sensitive     OXACILLIN <=0.25 SENSITIVE Sensitive     TETRACYCLINE <=1 SENSITIVE Sensitive     VANCOMYCIN 1 SENSITIVE Sensitive     TRIMETH/SULFA <=10 SENSITIVE Sensitive     CLINDAMYCIN <=0.25 SENSITIVE Sensitive     RIFAMPIN <=0.5 SENSITIVE Sensitive     Inducible Clindamycin NEGATIVE Sensitive     * >=100,000 COLONIES/mL STAPHYLOCOCCUS AUREUS  MRSA PCR Screening     Status: None   Collection Time: 10/30/20 12:17 AM   Specimen: Nasal Mucosa; Nasopharyngeal  Result Value Ref Range Status   MRSA by PCR NEGATIVE NEGATIVE Final    Comment:        The GeneXpert MRSA Assay (FDA approved for NASAL specimens only), is one component of a comprehensive MRSA colonization surveillance program. It is not intended to diagnose MRSA infection nor to guide or monitor treatment for MRSA infections. Performed at [facility], [address], [place], [place] [address]   C Difficile Quick Screen w PCR reflex     Status: Abnormal   Collection Time: 10/30/20 11:15 AM   Specimen: STOOL  Result Value Ref Range Status   C Diff antigen POSITIVE (A) NEGATIVE Final   C Diff toxin POSITIVE (A) NEGATIVE Final   C Diff interpretation Toxin producing C. difficile detected.   Final    Comment: CRITICAL RESULT CALLED TO, READ BACK BY AND VERIFIED WITH: FOLEY,BRITANY '@[id number]'  10/30/20 BY JONES,T Performed at [facility], [address], [place], [place] [address]   Resp Panel by RT-PCR (Flu A&B, Covid) Nasopharyngeal Swab     Status: None   Collection Time: 11/04/20 11:12 AM   Specimen: Nasopharyngeal Swab; Nasopharyngeal(NP) swabs in vial transport medium  Result Value Ref Range Status   SARS Coronavirus 2 by RT PCR NEGATIVE NEGATIVE Final    Comment: (NOTE) SARS-CoV-2 target nucleic acids are NOT DETECTED.  The SARS-CoV-2 RNA is generally detectable in upper respiratory specimens during the acute phase of infection. The lowest concentration of SARS-CoV-2 viral copies this assay can detect is 138 copies/mL. A negative result does not preclude SARS-Cov-2 infection and should not be used as the sole basis for treatment or other patient management decisions. A negative result may occur with  improper specimen collection/handling, submission of specimen other than nasopharyngeal swab, presence of viral mutation(s) within the areas targeted by this assay, and inadequate number of viral copies(<138 copies/mL). A negative result must be combined with clinical observations, patient history, and epidemiological information. The expected result is Negative.  Fact Sheet for Patients:  EntrepreneurPulse.com.au  Fact Sheet for Healthcare Providers:  IncredibleEmployment.be  This test is no t yet approved or cleared by the [place] FDA and  has been authorized for detection and/or diagnosis of SARS-CoV-2 by FDA under an Emergency Use Authorization (EUA). This EUA will remain  in effect (meaning this test can be used) for the duration of the COVID-19 declaration under Section 564(b)(1) of the Act, 21 U.S.C.section 360bbb-3(b)(1), unless the authorization is terminated  or revoked sooner.       Influenza A by PCR NEGATIVE  NEGATIVE Final   Influenza B by PCR NEGATIVE NEGATIVE Final    Comment: (NOTE) The Xpert Xpress SARS-CoV-2/FLU/RSV plus assay is intended as an aid in the diagnosis of influenza from Nasopharyngeal swab specimens and should  not be used as a sole basis for treatment. Nasal washings and aspirates are unacceptable for Xpert Xpress SARS-CoV-2/FLU/RSV testing.  Fact Sheet for Patients: EntrepreneurPulse.com.au  Fact Sheet for Healthcare Providers: IncredibleEmployment.be  This test is not yet approved or cleared by the [place] FDA and has been authorized for detection and/or diagnosis of SARS-CoV-2 by FDA under an Emergency Use Authorization (EUA). This EUA will remain in effect (meaning this test can be used) for the duration of the COVID-19 declaration under Section 564(b)(1) of the Act, 21 U.S.C. section 360bbb-3(b)(1), unless the authorization is terminated or revoked.  Performed at [facility], [address] [place], [place] [address]     Today   Subjective    Omah Dewalt today has no new complaints -- Consistency and frequency of stools has improved significantly with only about 2-3 BMs in 24 hr periods     No fever  Or chills    Patient has been seen and examined prior to discharge   Objective   Blood pressure 135/85, pulse 79, temperature 98 F (36.7 C), temperature source Oral, resp. rate 18, height '5\' 1"'  (1.549 m), weight 78.9 kg, last menstrual period 05/19/2016, SpO2 98 %.   Intake/Output Summary (Last 24 hours) at 11/08/2020 1155 Last data filed at 11/08/2020 0230 Gross per 24 hour  Intake 970 ml  Output 2050 ml  Net -1080 ml    Exam Gen:- Awake Alert, no acute distress  HEENT:- [place].AT, No sclera icterus Neck-Supple Neck,No JVD,.  Lungs-  CTAB , good air movement bilaterally  CV- S1, S2 normal, regular Abd-  +ve B.Sounds, Abd Soft, No tenderness,    Extremity/Skin:- No  edema,   good pulses Psych-affect is  appropriate, oriented x3 Neuro-generalized weakness, no new focal deficits, no tremors    Data Review   CBC w Diff:  Lab Results  Component Value Date   WBC 17.1 (H) 10/29/2020   HGB 16.6 (H) 10/29/2020   HCT 53.9 (H) 10/29/2020   PLT 327 10/29/2020   LYMPHOPCT 5 10/29/2020   MONOPCT 3 10/29/2020   EOSPCT 0 10/29/2020   BASOPCT 0 10/29/2020    CMP:  Lab Results  Component Value Date   NA 141 11/01/2020   K 4.3 11/01/2020   CL 115 (H) 11/01/2020   CO2 19 (L) 11/01/2020   BUN 29 (H) 11/01/2020   CREATININE 1.42 (H) 11/01/2020   CREATININE 1.56 (H) 07/26/2018   PROT 7.5 10/29/2020   ALBUMIN 3.2 (L) 10/29/2020   BILITOT 1.3 (H) 10/29/2020   ALKPHOS 151 (H) 10/29/2020   AST 7 (L) 10/29/2020   ALT 18 10/29/2020  .   Total Discharge time is about 33 minutes  Roxan Hockey M.D on 11/08/2020 at 11:55 AM  Go to [email or web address] -  for contact info  [facility] - Office  [phone]

## 2020-11-07 NOTE — Progress Notes (Signed)
Physical Therapy Treatment Patient Details Name: [name] MRN: [id number] DOB: Apr 26, 1966 Today's Date: 11/07/2020    History of Present Illness HPI: The patient is a 55 y.o. year-old w/ hx of CVA, seizures, obesity, tobacco abuse, CM EF 25%, HL, hx GIB, HTN, diabetes on insulin, Charcot foot, COPD, cocaine abuse, CKD 3, chronic syst/ diast CHF, anemia, etoh use presented to ED by EMS w/ c/o laying in bed for 2 days w/o medications.  She had reportedly been vomiting and having diarrhea and has not been able to keep down her medication. EMS reported pt's boyfriend left 2 days ago and did not return, we has helping to take care of her and give her her medications. EMS BP 80/60, temp 94.5, HR 160 and CBG 414. In ED RR 31, temp 94.8, HR 145 - 162, RA 97% sat, BP 70/30 >> 150/70 after cardioversion. BS > 600, Na 133 CO2 17  AG 25  Glu 874  BUN 61  Cr 2.47  Ca 8.8  Alb 3.2  LFT's okay, egfr 22  WBC 17K  Hb 16.6  plt 327  Lactic 2.3 > 1.9  Beta-hydroxy ^ 7.16, tsh 0.5   CXR showed LLL atelectasis, no consolidation. EKG showed wide-complex tachycardia at 181 bpm afib w/ RVR, QRS 125 msec. Repeat ekg after cardioversion x 2 (120J, 150J) showed sinus tach at 104 bpm.  In ED she was cardioverted as above and started on IV insulin drip and received IV vanc 1500 mg x 1, IV zosyn also ordered. Got 4 L total bolus IVF"s. Blood and urine cx's sent.  Asked to see for admission.    PT Comments    Patient agreeable for therapy after much encouragement.  Patient demonstrates improvement with less assistance for supine to sitting, scooting up to EOB, fair/good return for completing BLE ROM/strengthening exercises with verbal cues and demonstration.  Patient able to take a few side steps at bedside requiring Mod/max assist, limited mostly due to fatigue and BLE weakness.  Patient tolerated sitting up at bedside after therapy - nursing staff notified that patient requesting to be cleaned.  Patient will benefit from  continued physical therapy in hospital and recommended venue below to increase strength, balance, endurance for safe ADLs and gait.   Follow Up Recommendations  SNF     Equipment Recommendations  None recommended by PT    Recommendations for Other Services       Precautions / Restrictions Precautions Precautions: Fall Restrictions Weight Bearing Restrictions: No    Mobility  Bed Mobility Overal bed mobility: Needs Assistance Bed Mobility: Sit to Supine       Sit to supine: Min assist;Min guard   General bed mobility comments: increased time, labored movement  Transfers Overall transfer level: Needs assistance Equipment used: Rolling walker (2 wheeled) Transfers: Sit to/from Stand Sit to Stand: Mod assist         General transfer comment: increased time, labored movement  Ambulation/Gait Ambulation/Gait assistance: Mod assist;Max assist Gait Distance (Feet): 3 Feet Assistive device: Rolling walker (2 wheeled) Gait Pattern/deviations: Decreased step length - left;Decreased step length - right;Decreased stride length Gait velocity: decreased   General Gait Details: limited to 2-3 slow labored unsteady side steps before having to sit due to c/o fatigue   Stairs             Wheelchair Mobility    Modified Rankin (Stroke Patients Only)       Balance Overall balance assessment: Needs assistance Sitting-balance support: Feet  supported;No upper extremity supported Sitting balance-Leahy Scale: Fair Sitting balance - Comments: fair/good seated at EOB   Standing balance support: During functional activity;Bilateral upper extremity supported Standing balance-Leahy Scale: Poor Standing balance comment: fair/poor using RW                            Cognition Arousal/Alertness: Awake/alert Behavior During Therapy: WFL for tasks assessed/performed;Agitated Overall Cognitive Status: Within Functional Limits for tasks assessed                                         Exercises General Exercises - Lower Extremity Long Arc Quad: Seated;AROM;Strengthening;Both;10 reps Hip Flexion/Marching: Seated;AROM;Strengthening;Both;10 reps Toe Raises: Seated;AROM;Strengthening;Both;15 reps Heel Raises: Seated;AROM;Strengthening;Both;15 reps    General Comments        Pertinent Vitals/Pain Pain Assessment: Faces Faces Pain Scale: Hurts a little bit Pain Location: left shoulder Pain Descriptors / Indicators: Sore Pain Intervention(s): Limited activity within patient's tolerance;Monitored during session    Home Living                      Prior Function            PT Goals (current goals can now be found in the care plan section) Acute Rehab PT Goals Patient Stated Goal: return home after rehab PT Goal Formulation: With patient Time For Goal Achievement: 11/15/20 Potential to Achieve Goals: Good Progress towards PT goals: Progressing toward goals    Frequency    Min 3X/week      PT Plan Current plan remains appropriate    Co-evaluation              AM-PAC PT "6 Clicks" Mobility   Outcome Measure  Help needed turning from your back to your side while in a flat bed without using bedrails?: A Little Help needed moving from lying on your back to sitting on the side of a flat bed without using bedrails?: A Little Help needed moving to and from a bed to a chair (including a wheelchair)?: A Lot Help needed standing up from a chair using your arms (e.g., wheelchair or bedside chair)?: A Lot Help needed to walk in hospital room?: A Lot Help needed climbing 3-5 steps with a railing? : Total 6 Click Score: 13    End of Session   Activity Tolerance: Patient tolerated treatment well;Patient limited by fatigue Patient left: in bed;with call bell/phone within reach Nurse Communication: Mobility status PT Visit Diagnosis: Other abnormalities of gait and mobility (R26.89);Unsteadiness on feet  (R26.81);Muscle weakness (generalized) (M62.81)     Time: 2353-6144 PT Time Calculation (min) (ACUTE ONLY): 29 min  Charges:  $Therapeutic Exercise: 8-22 mins $Therapeutic Activity: 8-22 mins                     2:34 PM, 11/07/20 Lonell Grandchild, MPT Physical Therapist with [facility] [phone] office [phone] mobile phone

## 2020-11-07 NOTE — Progress Notes (Signed)
Patient Demographics:    [name], is a 55 y.o. female, DOB - 02/01/1966, HKV:[id number]  Admit date - 10/29/2020   Admitting Physician Roney Jaffe, MD  Outpatient Primary MD for the patient is [name], MD  LOS - 9   Chief Complaint  Patient presents with  . Hypotension        Subjective:    [name] today has no fevers, no emesis,  No chest pain,    -Patient was seen, evaluated, examined, chart reviewed, patient was discharged to SNF on 11/07/2020 -- After discharge and patient was later informed that SNF is unable to take patient until 11/08/2020 --Please see full discharge summary dated for 11/07/2020  Assessment  & [name] :    Principal Problem:   DKA (diabetic ketoacidosis) ([place]) Active Problems:   Essential hypertension   Chronic pain syndrome   Chronic combined systolic and diastolic CHF (congestive heart failure) (HCC)   COPD (chronic obstructive pulmonary disease) (HCC)   AKI (acute kidney injury) ([place])   Atrial fibrillation with RVR ([place])   Seizures ([place])   Shock ([place])   Brief Admission History:  55 y.o.year-old w/ hx of CVA, seizures, obesity, tobacco abuse, CM EF 25%, HL, hx GIB, HTN, diabetes on insulin, Charcot foot, COPD, cocaine abuse, CKD 3, chronic syst/ diast CHF, anemia, etoh use presented to ED by EMS w/ c/o laying in bed for 2 days w/o medications. She had reportedly been vomiting and having diarrhea and has not been able to keep down her medication. EMS reported pt's boyfriend left 2 days ago and did not return, we has helping to take care of her and give her her medications. EMS BP 80/60, temp 94.5, HR 160 and CBG 414. In ED RR 31, temp 94.8, HR 145 - 162, RA 97% sat, BP 70/30 >>150/70 after cardioversion. BS >600, Na 133 CO2 17 AG 25 Glu 874 BUN 61 Cr 2.47 Ca 8.8 Alb 3.2 LFT's okay, egfr 22 WBC 17K Hb 16.6 plt 327 Lactic 2.3 >1.9  Beta-hydroxy ^ 7.16, tsh 0.5 CXR showed LLL atelectasis, noconsolidation.EKG showed wide-complex tachycardia at 181 bpm afib w/ RVR, QRS 125 msec. Repeat ekg after cardioversion x 2 (120J, 150J) showed sinus tach at 104 bpm. In ED she was cardioverted as above and started on IV insulin drip and received IV vanc 1500 mg x 1, IV zosyn also ordered. Got 4 L total bolus IVF"s. Blood and urine cx's sent. Asked to see for admission.   Pt seen in room, lives alone, uses a walker or wheelchair mostly, needs help caring for herself. Denies any recent CP or abd pain, +n/v and diarrhea, no fevers or chills. Vague historian.  Assessment & [name]:   Principal Problem:   DKA (diabetic ketoacidosis) ([place]) Active Problems:   Essential hypertension   Chronic pain syndrome   Chronic combined systolic and diastolic CHF (congestive heart failure) (HCC)   COPD (chronic obstructive pulmonary disease) (HCC)   AKI (acute kidney injury) (HCC)   Atrial fibrillation with RVR (HCC)   Seizures (HCC)   Shock ([place])  A/p 1)Atrial fibrillation with RVR - pt s/p 2 cardioversions performed in ED by ED staff.-Remains in sinus rhythm, continue metoprolol for rate control and apixaban for stroke prophylaxis  2)C diff infection with diarrhea --overall much improved, continue oral Vanco, Rx given  3)DKA--on admission, resolved, off IV insulin, continue subcu scheduled Lantus insulin along with NovoLog sliding scale  4)generalized weakness and debilitation -PT recommended SNF placement.,Unsteady gait, high fall risk, pt agres to  SNF Rehabtransfer  5)Chronic combined sys/diastolic heart failure - stable, okay to use low-dose Lasix 20 mg daily =-Echo with EF of 50 to 55%  6)Epilepsy -stable, continue Keppra  7)COPD - stable,no acute exacerbation at this time, no need for steroids,continue bronchodilators  8)Depression -continue Cymbalta  DVT prophylaxis: apixaban  Code Status: Full  Family  Communication: none present  Disposition:  SNF placement  (awaiting isolation bed at SNF)  Status is: Inpatient  Remains inpatient appropriate because:IV treatments appropriate due to intensity of illness or inability to take PO and Inpatient level of care appropriate due to severity of illness  Dispo: The patient is from: Home  Anticipated d/c is to: Home  Anticipated d/c date is: 1 day  Patient currently is medically stable to d/c.  Awaiting for SNF to have an isolation bed (due to patient having C diff infection).   Consultants:     Procedures:    Antimicrobials:  Vancomycin 1/6>>    Disposition/Need for in-Hospital Stay- patient unable to be discharged at this time due to awaiting SNF bed transfer possibly on 11/07/2020  Status is: Inpatient  Remains inpatient appropriate because:Please see above   Disposition: The patient is from: Home              Anticipated d/c is to: SNF              Anticipated d/c date is: 1 day              Patient currently is medically stable to d/c.  Code Status :  -  Code Status: Full Code   Family Communication:    NA (patient is alert, awake and coherent)   Consults  :    DVT Prophylaxis  :   - SCDs   apixaban (ELIQUIS) tablet 5 mg    Lab Results  Component Value Date   PLT 327 10/29/2020    Inpatient Medications  Scheduled Meds: . amLODipine  10 mg Oral Daily  . apixaban  5 mg Oral BID  . clopidogrel  75 mg Oral Daily  . DULoxetine  60 mg Oral Daily  . gabapentin  300 mg Oral TID  . guaiFENesin  1,200 mg Oral BID  . insulin aspart  0-15 Units Subcutaneous TID WC  . insulin aspart  0-5 Units Subcutaneous QHS  . insulin aspart  3 Units Subcutaneous TID WC  . insulin glargine  9 Units Subcutaneous Q24H  . levETIRAcetam  750 mg Oral BID  . metoprolol succinate  50 mg Oral BID  . montelukast  10 mg Oral QHS  . pantoprazole  40 mg Oral BID  . rosuvastatin  10 mg Oral Daily  .  saccharomyces boulardii  250 mg Oral BID  . vancomycin  125 mg Oral QID   Continuous Infusions: PRN Meds:.acetaminophen, albuterol, hydrALAZINE, ondansetron (ZOFRAN) IV, umeclidinium bromide    Anti-infectives (From admission, onward)   Start     Dose/Rate Route Frequency Ordered Stop   11/07/20 0000  vancomycin (VANCOCIN) 50 mg/mL oral solution        125 mg Oral 4 times daily 11/07/20 1047 11/12/20 2359   11/04/20 0000  vancomycin (VANCOCIN) 50 mg/mL oral solution  Status:  Discontinued  125 mg Oral 4 times daily 11/04/20 1201 11/07/20    11/03/20 1030  fluconazole (DIFLUCAN) tablet 150 mg        150 mg Oral  Once 11/03/20 0939 11/03/20 1320   10/31/20 1700  vancomycin (VANCOCIN) IVPB 1000 mg/200 mL premix  Status:  Discontinued        1,000 mg 200 mL/hr over 60 Minutes Intravenous Every 48 hours 10/29/20 1640 10/30/20 0828   10/30/20 1530  vancomycin (VANCOCIN) 50 mg/mL oral solution 125 mg        125 mg Oral 4 times daily 10/30/20 1434 11/09/20 1359   10/30/20 0200  piperacillin-tazobactam (ZOSYN) IVPB 3.375 g  Status:  Discontinued        3.375 g 12.5 mL/hr over 240 Minutes Intravenous Every 8 hours 10/29/20 1650 10/30/20 1602   10/29/20 1945  piperacillin-tazobactam (ZOSYN) IVPB 3.375 g  Status:  Discontinued        3.375 g 100 mL/hr over 30 Minutes Intravenous  Once 10/29/20 1936 10/29/20 1941   10/29/20 1700  piperacillin-tazobactam (ZOSYN) IVPB 3.375 g        3.375 g 100 mL/hr over 30 Minutes Intravenous  Once 10/29/20 1648 10/29/20 2007   10/29/20 1645  vancomycin (VANCOREADY) IVPB 1500 mg/300 mL        1,500 mg 150 mL/hr over 120 Minutes Intravenous  Once 10/29/20 1639 10/29/20 1950   10/29/20 1630  vancomycin (VANCOCIN) IVPB 1000 mg/200 mL premix  Status:  Discontinued        1,000 mg 200 mL/hr over 60 Minutes Intravenous  Once 10/29/20 1621 10/29/20 1639        Objective:   Vitals:   11/06/20 2019 11/07/20 0610 11/07/20 0804 11/07/20 1333  BP: (!) 141/95  (!) 136/100 136/88 (!) 145/95  Pulse: 85 85 77 77  Resp: '16 18 18 17  ' Temp: 98.4 F (36.9 C) 98.2 F (36.8 C) 98.7 F (37.1 C) 98.5 F (36.9 C)  TempSrc: Oral Oral  Oral  SpO2: 98% 96% 98% 99%  Weight:      Height:        Wt Readings from Last 3 Encounters:  10/29/20 78.9 kg  10/09/20 79.4 kg  10/01/20 78.9 kg     Intake/Output Summary (Last 24 hours) at 11/07/2020 1651 Last data filed at 11/07/2020 1644 Gross per 24 hour  Intake 720 ml  Output 2650 ml  Net -1930 ml    Physical Exam  Gen:- Awake Alert,  In no apparent distress  HEENT:- [place].AT, No sclera icterus Neck-Supple Neck,No JVD,.  Lungs-  CTAB , fair symmetrical air movement CV- S1, S2 normal, regular  Abd-  +ve B.Sounds, Abd Soft, No tenderness,    Extremity/Skin:- No  edema, pedal pulses present  Psych-affect is appropriate, oriented x3 Neuro-generalized weakness, no new focal deficits, no tremors   Data Review:   Micro Results Recent Results (from the past 240 hour(s))  Blood Culture (routine x 2)     Status: None   Collection Time: 10/29/20  1:38 PM   Specimen: BLOOD LEFT ARM  Result Value Ref Range Status   Specimen Description BLOOD LEFT ARM  Final   Special Requests   Final    BOTTLES DRAWN AEROBIC AND ANAEROBIC Blood Culture results may not be optimal due to an inadequate volume of blood received in culture bottles   Culture   Final    NO GROWTH 5 DAYS Performed at [facility], [address], [place],  [address]  Report Status 11/03/2020 FINAL  Final  Blood Culture (routine x 2)     Status: None   Collection Time: 10/29/20  3:14 PM   Specimen: BLOOD RIGHT HAND  Result Value Ref Range Status   Specimen Description BLOOD RIGHT HAND  Final   Special Requests   Final    BOTTLES DRAWN AEROBIC AND ANAEROBIC Blood Culture results may not be optimal due to an inadequate volume of blood received in culture bottles   Culture   Final    NO GROWTH 5 DAYS Performed at [facility], [address], [place], [place] [address]    Report Status 11/03/2020 FINAL  Final  Resp Panel by RT-PCR (Flu A&B, Covid) Nasopharyngeal Swab     Status: None   Collection Time: 10/29/20  5:20 PM   Specimen: Nasopharyngeal Swab; Nasopharyngeal(NP) swabs in vial transport medium  Result Value Ref Range Status   SARS Coronavirus 2 by RT PCR NEGATIVE NEGATIVE Final    Comment: (NOTE) SARS-CoV-2 target nucleic acids are NOT DETECTED.  The SARS-CoV-2 RNA is generally detectable in upper respiratory specimens during the acute phase of infection. The lowest concentration of SARS-CoV-2 viral copies this assay can detect is 138 copies/mL. A negative result does not preclude SARS-Cov-2 infection and should not be used as the sole basis for treatment or other patient management decisions. A negative result may occur with  improper specimen collection/handling, submission of specimen other than nasopharyngeal swab, presence of viral mutation(s) within the areas targeted by this assay, and inadequate number of viral copies(<138 copies/mL). A negative result must be combined with clinical observations, patient history, and epidemiological information. The expected result is Negative.  Fact Sheet for Patients:  EntrepreneurPulse.com.au  Fact Sheet for Healthcare Providers:  IncredibleEmployment.be  This test is no t yet approved or cleared by the [place] FDA and  has been authorized for detection and/or diagnosis of SARS-CoV-2 by FDA under an Emergency Use Authorization (EUA). This EUA will remain  in effect (meaning this test can be used) for the duration of the COVID-19 declaration under Section 564(b)(1) of the Act, 21 U.S.C.section 360bbb-3(b)(1), unless the authorization is terminated  or revoked sooner.       Influenza A by PCR NEGATIVE NEGATIVE Final   Influenza B by PCR NEGATIVE NEGATIVE Final    Comment: (NOTE) The Xpert Xpress SARS-CoV-2/FLU/RSV  plus assay is intended as an aid in the diagnosis of influenza from Nasopharyngeal swab specimens and should not be used as a sole basis for treatment. Nasal washings and aspirates are unacceptable for Xpert Xpress SARS-CoV-2/FLU/RSV testing.  Fact Sheet for Patients: EntrepreneurPulse.com.au  Fact Sheet for Healthcare Providers: IncredibleEmployment.be  This test is not yet approved or cleared by the [place] FDA and has been authorized for detection and/or diagnosis of SARS-CoV-2 by FDA under an Emergency Use Authorization (EUA). This EUA will remain in effect (meaning this test can be used) for the duration of the COVID-19 declaration under Section 564(b)(1) of the Act, 21 U.S.C. section 360bbb-3(b)(1), unless the authorization is terminated or revoked.  Performed at [facility] [place], [address], [place], [place] [address]   Urine culture     Status: Abnormal   Collection Time: 10/29/20  8:12 PM   Specimen: In/Out Cath Urine  Result Value Ref Range Status   Specimen Description   Final    IN/OUT CATH URINE Performed at [facility], [address], [place],  [address]    Special Requests   Final  Immunocompromised Performed at [facility], [address] [place], [place] [address]    Culture >=100,000 COLONIES/mL STAPHYLOCOCCUS AUREUS (A)  Final   Report Status 11/01/2020 FINAL  Final   Organism ID, Bacteria STAPHYLOCOCCUS AUREUS (A)  Final      Susceptibility   Staphylococcus aureus - MIC*    CIPROFLOXACIN <=0.5 SENSITIVE Sensitive     GENTAMICIN <=0.5 SENSITIVE Sensitive     NITROFURANTOIN <=16 SENSITIVE Sensitive     OXACILLIN <=0.25 SENSITIVE Sensitive     TETRACYCLINE <=1 SENSITIVE Sensitive     VANCOMYCIN 1 SENSITIVE Sensitive     TRIMETH/SULFA <=10 SENSITIVE Sensitive     CLINDAMYCIN <=0.25 SENSITIVE Sensitive     RIFAMPIN <=0.5 SENSITIVE Sensitive     Inducible Clindamycin NEGATIVE Sensitive     * >=100,000  COLONIES/mL STAPHYLOCOCCUS AUREUS  MRSA PCR Screening     Status: None   Collection Time: 10/30/20 12:17 AM   Specimen: Nasal Mucosa; Nasopharyngeal  Result Value Ref Range Status   MRSA by PCR NEGATIVE NEGATIVE Final    Comment:        The GeneXpert MRSA Assay (FDA approved for NASAL specimens only), is one component of a comprehensive MRSA colonization surveillance program. It is not intended to diagnose MRSA infection nor to guide or monitor treatment for MRSA infections. Performed at [facility], [address] [place], [place] [address]   C Difficile Quick Screen w PCR reflex     Status: Abnormal   Collection Time: 10/30/20 11:15 AM   Specimen: STOOL  Result Value Ref Range Status   C Diff antigen POSITIVE (A) NEGATIVE Final   C Diff toxin POSITIVE (A) NEGATIVE Final   C Diff interpretation Toxin producing C. difficile detected.  Final    Comment: CRITICAL RESULT CALLED TO, READ BACK BY AND VERIFIED WITH: FOLEY,BRITANY '@[id number]'  10/30/20 BY JONES,T Performed at [facility], [address] [place], [place] [address]   Resp Panel by RT-PCR (Flu A&B, Covid) Nasopharyngeal Swab     Status: None   Collection Time: 11/04/20 11:12 AM   Specimen: Nasopharyngeal Swab; Nasopharyngeal(NP) swabs in vial transport medium  Result Value Ref Range Status   SARS Coronavirus 2 by RT PCR NEGATIVE NEGATIVE Final    Comment: (NOTE) SARS-CoV-2 target nucleic acids are NOT DETECTED.  The SARS-CoV-2 RNA is generally detectable in upper respiratory specimens during the acute phase of infection. The lowest concentration of SARS-CoV-2 viral copies this assay can detect is 138 copies/mL. A negative result does not preclude SARS-Cov-2 infection and should not be used as the sole basis for treatment or other patient management decisions. A negative result may occur with  improper specimen collection/handling, submission of specimen other than nasopharyngeal swab, presence of viral mutation(s) within  the areas targeted by this assay, and inadequate number of viral copies(<138 copies/mL). A negative result must be combined with clinical observations, patient history, and epidemiological information. The expected result is Negative.  Fact Sheet for Patients:  EntrepreneurPulse.com.au  Fact Sheet for Healthcare Providers:  IncredibleEmployment.be  This test is no t yet approved or cleared by the [place] FDA and  has been authorized for detection and/or diagnosis of SARS-CoV-2 by FDA under an Emergency Use Authorization (EUA). This EUA will remain  in effect (meaning this test can be used) for the duration of the COVID-19 declaration under Section 564(b)(1) of the Act, 21 U.S.C.section 360bbb-3(b)(1), unless the authorization is terminated  or revoked sooner.       Influenza A by  PCR NEGATIVE NEGATIVE Final   Influenza B by PCR NEGATIVE NEGATIVE Final    Comment: (NOTE) The Xpert Xpress SARS-CoV-2/FLU/RSV plus assay is intended as an aid in the diagnosis of influenza from Nasopharyngeal swab specimens and should not be used as a sole basis for treatment. Nasal washings and aspirates are unacceptable for Xpert Xpress SARS-CoV-2/FLU/RSV testing.  Fact Sheet for Patients: EntrepreneurPulse.com.au  Fact Sheet for Healthcare Providers: IncredibleEmployment.be  This test is not yet approved or cleared by the [place] FDA and has been authorized for detection and/or diagnosis of SARS-CoV-2 by FDA under an Emergency Use Authorization (EUA). This EUA will remain in effect (meaning this test can be used) for the duration of the COVID-19 declaration under Section 564(b)(1) of the Act, 21 U.S.C. section 360bbb-3(b)(1), unless the authorization is terminated or revoked.  Performed at [facility], [address] [place], [place] [address]     Radiology Reports CT ABDOMEN PELVIS WO CONTRAST  Result  Date: 10/29/2020 CLINICAL DATA:  Nausea vomiting EXAM: CT ABDOMEN AND PELVIS WITHOUT CONTRAST TECHNIQUE: Multidetector CT imaging of the abdomen and pelvis was performed following the standard protocol without IV contrast. COMPARISON:  Radiograph 08/21/2020, CT 07/12/2018 FINDINGS: Lower chest: Lung bases demonstrate small bilateral pleural effusions. Hazy posterior lung bases likely atelectasis. Cardiomegaly. Mild circumferential distal esophageal thickening. Hepatobiliary: No focal liver abnormality is seen. No gallstones, gallbladder wall thickening, or biliary dilatation. Pancreas: Unremarkable. No pancreatic ductal dilatation or surrounding inflammatory changes. Spleen: Normal in size without focal abnormality. Adrenals/Urinary Tract: Adrenal glands are within normal limits. Kidneys show no hydronephrosis. Nonspecific perinephric fat stranding. Markedly distended urinary bladder. Stomach/Bowel: The stomach is nonenlarged. No dilated small bowel. Negative appendix. No acute bowel wall thickening. Vascular/Lymphatic: Moderate aortic atherosclerosis. No aneurysm. No suspicious nodes. Reproductive: Uterus is unremarkable.  No adnexal mass. Other: Negative for free air or free fluid. Musculoskeletal: Moderate superior endplate compression deformity at L1 with about 40% loss of height of anterior vertebral body. Degenerative changes most advanced at L3-L4 and L5-S1. IMPRESSION: 1. No CT evidence for acute intra-abdominal or pelvic abnormality. 2. Mild distal esophageal thickening, question esophagitis or reflux. 3. Small bilateral pleural effusions. Cardiomegaly. 4. Moderate superior endplate compression deformity at L1, of uncertain age, new since 2019. Aortic Atherosclerosis (ICD10-I70.0). Electronically Signed   By: Donavan Foil M.D.   On: 10/29/2020 18:57   DG CHEST PORT 1 VIEW  Result Date: 11/02/2020 CLINICAL DATA:  Chest pain when swallowing water EXAM: PORTABLE CHEST 1 VIEW COMPARISON:  10/29/2020  FINDINGS: Cardiomegaly and aortic tortuosity. Stable lung markings. There is no edema, consolidation, effusion, or pneumothorax. IMPRESSION: 1. No acute finding when compared to prior. 2. Cardiomegaly. Electronically Signed   By: Monte Fantasia M.D.   On: 11/02/2020 04:36   DG Chest Port 1 View  Result Date: 10/29/2020 CLINICAL DATA:  Questionable sepsis. EXAM: PORTABLE CHEST 1 VIEW COMPARISON:  August 19, 2020. FINDINGS: Enlarged cardiac silhouette, similar to prior. No confluent consolidation. Streaky left basilar opacity. No visible pleural effusions or pneumothorax on this semi upright portable radiograph. No acute osseous abnormality. IMPRESSION: Streaky left basilar opacity, favored to reflect atelectasis. No confluent consolidation. Electronically Signed   By: Margaretha Sheffield MD   On: 10/29/2020 15:04     CBC No results for input(s): WBC, HGB, HCT, PLT, MCV, MCH, MCHC, RDW, LYMPHSABS, MONOABS, EOSABS, BASOSABS, BANDABS in the last 168 hours.  Invalid input(s): NEUTRABS, BANDSABD  Chemistries  Recent Labs  Lab 11/01/20 0400  NA 141  K  4.3  CL 115*  CO2 19*  GLUCOSE 247*  BUN 29*  CREATININE 1.42*  CALCIUM 7.9*  MG 2.3   ------------------------------------------------------------------------------------------------------------------ No results for input(s): CHOL, HDL, LDLCALC, TRIG, CHOLHDL, LDLDIRECT in the last 72 hours.  Lab Results  Component Value Date   HGBA1C 13.0 (H) 10/29/2020   ------------------------------------------------------------------------------------------------------------------ No results for input(s): TSH, T4TOTAL, T3FREE, THYROIDAB in the last 72 hours.  Invalid input(s): FREET3 ------------------------------------------------------------------------------------------------------------------ No results for input(s): VITAMINB12, FOLATE, FERRITIN, TIBC, IRON, RETICCTPCT in the last 72 hours.  Coagulation profile No results for input(s): INR,  PROTIME in the last 168 hours.  No results for input(s): DDIMER in the last 72 hours.  Cardiac Enzymes No results for input(s): CKMB, TROPONINI, MYOGLOBIN in the last 168 hours.  Invalid input(s): CK ------------------------------------------------------------------------------------------------------------------    Component Value Date/Time   BNP 835.0 (H) 06/22/2017 0177     Roxan Hockey M.D on 11/07/2020 at 4:51 PM  Go to [email or web address] - for contact info  [facility] - Office  [phone]

## 2020-11-07 NOTE — Discharge Instructions (Signed)
1)You are taking Eliquis/apixaban and Plavix/clopidogrel which are blood thinners, so Please Avoid ibuprofen/Advil/Aleve/Motrin/Goody Powders/Naproxen/BC powders/Meloxicam/Diclofenac/Indomethacin and other Nonsteroidal anti-inflammatory medications as these will make you more likely to bleed and can cause stomach ulcers, can also cause Kidney problems. 2) CBC and BMP Blood Tests every Monday starting 11/11/2020 for the next 3 weeks advised 3) NovoLog sliding scale insulin as below:- insulin aspart (novoLOG) injection 0-10 Units 0-10 Units Subcutaneous, 3 times daily with meals CBG < 70: Implement Hypoglycemia Standing Orders and refer to Hypoglycemia Standing Orders sidebar report  CBG 70 - 120: 0 unit CBG 121 - 150: 0 unit  CBG 151 - 200: 1 unit CBG 201 - 250: 2 units CBG 251 - 300: 4 units CBG 301 - 350: 6 units  CBG 351 - 400: 8 units  CBG > 400: 10 units

## 2020-11-08 LAB — GLUCOSE, CAPILLARY
Glucose-Capillary: 148 mg/dL — ABNORMAL HIGH (ref 70–99)
Glucose-Capillary: 157 mg/dL — ABNORMAL HIGH (ref 70–99)
Glucose-Capillary: 277 mg/dL — ABNORMAL HIGH (ref 70–99)
Glucose-Capillary: 234 mg/dL — ABNORMAL HIGH (ref 70–99)
Glucose-Capillary: 181 mg/dL — ABNORMAL HIGH (ref 70–99)

## 2020-11-08 NOTE — TOC Progression Note (Signed)
Transition of Care [facility]) - Progression Note   Patient Details  Name: [name] MRN: [id number] Date of Birth: [birth year]  Transition of [facility]) CM/SW [place], LCSW Phone Number: 11/08/2020, 4:26 PM  Clinical Narrative: CSW made multiple calls to [facility] with [facility]. Initially, [facility] had a bed quarantine available. However, [name] now will not have a female quarantine bed available until 1/18 due to residents' COVID exposure.  TOC followed up with Marianna Fuss at [facility]. Per Marianna Fuss, they cannot accept the patient because she is not fully vaccinated and she cannot go the the COVID beds because she does not have COVID. Avoidable days entered. TOC awaiting placement availability.  Expected Discharge Plan: [place] Barriers to Discharge: Continued Medical Work up  Expected Discharge Plan and Services Expected Discharge Plan: [place] In-house Referral: Clinical Social Work Post Acute Care Choice: [place] Living arrangements for the past 2 months: Single Family Home Expected Discharge Date: 11/08/20                Readmission Risk Interventions Readmission Risk Prevention Plan 10/31/2020 08/27/2020  Transportation Screening Complete Complete  Medication Review Press photographer) Complete Complete  PCP or Specialist appointment within 3-5 days of discharge - Complete  HRI or [place] - Complete  SW Recovery Care/Counseling Consult Complete Complete  Palliative Care Screening Not Applicable Not Applicable  Skilled Nursing Facility Complete Complete  Some recent data might be hidden

## 2020-11-08 NOTE — Progress Notes (Signed)
Patient Demographics:    [name], is a 55 y.o. female, DOB - [birth year], BJS:[id number]  Admit date - 10/29/2020   Admitting Physician Barbara Jaffe, MD  Outpatient Primary MD for the patient is Barbara Helper, MD  LOS - 10   Chief Complaint  Patient presents with  . Hypotension        Subjective:    [name] today has no fevers, no emesis,  No chest pain,    -- Resting comfortably, no new concerns -11/08/20-- -patient remains medically stable for discharge to SNF when bed available  Assessment  & Plan :    Principal Problem:   DKA (diabetic ketoacidosis) ([place]) Active Problems:   Essential hypertension   Chronic pain syndrome   Chronic combined systolic and diastolic CHF (congestive heart failure) (HCC)   COPD (chronic obstructive pulmonary disease) (HCC)   AKI (acute kidney injury) ([place])   Atrial fibrillation with RVR (HCC)   Seizures ([place])   Shock ([place])   Brief Admission History:  55 y.o.year-old w/ hx of CVA, seizures, obesity, tobacco abuse, CM EF 25%, HL, hx GIB, HTN, diabetes on insulin, Charcot foot, COPD, cocaine abuse, CKD 3, chronic syst/ diast CHF, anemia, etoh use presented to ED by EMS w/ c/o laying in bed for 2 days w/o medications. She had reportedly been vomiting and having diarrhea and has not been able to keep down her medication. EMS reported pt's boyfriend left 2 days ago and did not return, we has helping to take care of her and give her her medications. EMS BP 80/60, temp 94.5, HR 160 and CBG 414. In ED RR 31, temp 94.8, HR 145 - 162, RA 97% sat, BP 70/30 >>150/70 after cardioversion. BS >600, Na 133 CO2 17 AG 25 Glu 874 BUN 61 Cr 2.47 Ca 8.8 Alb 3.2 LFT's okay, egfr 22 WBC 17K Hb 16.6 plt 327 Lactic 2.3 >1.9 Beta-hydroxy ^ 7.16, tsh 0.5 CXR showed LLL atelectasis, noconsolidation.EKG showed wide-complex tachycardia at 181 bpm afib w/  RVR, QRS 125 msec. Repeat ekg after cardioversion x 2 (120J, 150J) showed sinus tach at 104 bpm. In ED she was cardioverted as above and started on IV insulin drip and received IV vanc 1500 mg x 1, IV zosyn also ordered. Got 4 L total bolus IVF"s. Blood and urine cx's sent. Asked to see for admission.   Pt seen in room, lives alone, uses a walker or wheelchair mostly, needs help caring for herself. Denies any recent CP or abd pain, +n/v and diarrhea, no fevers or chills. Vague historian.  11/08/20-- -patient remains medically stable for discharge to SNF when bed available  Assessment & Plan:   Principal Problem:   DKA (diabetic ketoacidosis) ([place]) Active Problems:   Essential hypertension   Chronic pain syndrome   Chronic combined systolic and diastolic CHF (congestive heart failure) (HCC)   COPD (chronic obstructive pulmonary disease) ([place])   AKI (acute kidney injury) ([place])   Atrial fibrillation with RVR ([place])   Seizures ([place])   Shock ([place])  A/p 1)Atrial fibrillation with RVR - pt s/p 2 cardioversions performed in ED by ED staff.-Remains in sinus rhythm, continue metoprolol for rate control and apixaban for stroke prophylaxis  2)C diff infection with diarrhea --overall much  improved, continue oral Vanco, Rx given  3)DKA--on admission, resolved, off IV insulin, continue subcu scheduled Lantus insulin along with NovoLog sliding scale  4)generalized weakness and debilitation -PT recommended SNF placement.,Unsteady gait, high fall risk, pt agres to  SNF Rehabtransfer  5)Chronic combined sys/diastolic heart failure - stable, okay to use low-dose Lasix 20 mg daily =-Echo with EF of 50 to 55%  6)Epilepsy -stable, continue Keppra  7)COPD - stable,no acute exacerbation at this time, no need for steroids,continue bronchodilators  8)Depression -continue Cymbalta  DVT prophylaxis: apixaban  Code Status: Full  Family Communication: none present  Disposition:  SNF  placement  (awaiting isolation bed at SNF)  Status is: Inpatient  Remains inpatient appropriate because:IV treatments appropriate due to intensity of illness or inability to take PO and Inpatient level of care appropriate due to severity of illness  Dispo: The patient is from: Home  Anticipated d/c is to: Home  Anticipated d/c date is: 1 day  Patient currently is medically stable to d/c.  Awaiting for SNF to have an isolation bed (due to patient having C diff infection).  -11/08/20-- -patient remains medically stable for discharge to SNF when bed available  Consultants:     Procedures:    Antimicrobials:  Vancomycin 1/6>>    Disposition/Need for in-Hospital Stay- patient unable to be discharged at this time due to awaiting SNF bed transfer possibly on 11/07/2020  Status is: Inpatient  Remains inpatient appropriate because:Please see above   Disposition: The patient is from: Home              Anticipated d/c is to: SNF              Anticipated d/c date is: 1 day              Patient currently is medically stable to d/c.  Code Status :  -  Code Status: Full Code   Family Communication:    NA (patient is alert, awake and coherent)   Consults  :    DVT Prophylaxis  :   - SCDs   apixaban (ELIQUIS) tablet 5 mg    Lab Results  Component Value Date   PLT 327 10/29/2020    Inpatient Medications  Scheduled Meds: . amLODipine  10 mg Oral Daily  . apixaban  5 mg Oral BID  . clopidogrel  75 mg Oral Daily  . DULoxetine  60 mg Oral Daily  . gabapentin  300 mg Oral TID  . guaiFENesin  1,200 mg Oral BID  . insulin aspart  0-15 Units Subcutaneous TID WC  . insulin aspart  0-5 Units Subcutaneous QHS  . insulin aspart  3 Units Subcutaneous TID WC  . insulin glargine  9 Units Subcutaneous Q24H  . levETIRAcetam  750 mg Oral BID  . metoprolol succinate  50 mg Oral BID  . montelukast  10 mg Oral QHS  . pantoprazole  40 mg Oral BID   . rosuvastatin  10 mg Oral Daily  . saccharomyces boulardii  250 mg Oral BID  . vancomycin  125 mg Oral QID   Continuous Infusions: PRN Meds:.acetaminophen, albuterol, alum & mag hydroxide-simeth, hydrALAZINE, ondansetron (ZOFRAN) IV, umeclidinium bromide    Anti-infectives (From admission, onward)   Start     Dose/Rate Route Frequency Ordered Stop   11/07/20 0000  vancomycin (VANCOCIN) 50 mg/mL oral solution        125 mg Oral 4 times daily 11/07/20 1047 11/12/20 2359   11/04/20 0000  vancomycin (  VANCOCIN) 50 mg/mL oral solution  Status:  Discontinued        125 mg Oral 4 times daily 11/04/20 1201 11/07/20    11/03/20 1030  fluconazole (DIFLUCAN) tablet 150 mg        150 mg Oral  Once 11/03/20 0939 11/03/20 1320   10/31/20 1700  vancomycin (VANCOCIN) IVPB 1000 mg/200 mL premix  Status:  Discontinued        1,000 mg 200 mL/hr over 60 Minutes Intravenous Every 48 hours 10/29/20 1640 10/30/20 0828   10/30/20 1530  vancomycin (VANCOCIN) 50 mg/mL oral solution 125 mg        125 mg Oral 4 times daily 10/30/20 1434 11/09/20 1359   10/30/20 0200  piperacillin-tazobactam (ZOSYN) IVPB 3.375 g  Status:  Discontinued        3.375 g 12.5 mL/hr over 240 Minutes Intravenous Every 8 hours 10/29/20 1650 10/30/20 1602   10/29/20 1945  piperacillin-tazobactam (ZOSYN) IVPB 3.375 g  Status:  Discontinued        3.375 g 100 mL/hr over 30 Minutes Intravenous  Once 10/29/20 1936 10/29/20 1941   10/29/20 1700  piperacillin-tazobactam (ZOSYN) IVPB 3.375 g        3.375 g 100 mL/hr over 30 Minutes Intravenous  Once 10/29/20 1648 10/29/20 2007   10/29/20 1645  vancomycin (VANCOREADY) IVPB 1500 mg/300 mL        1,500 mg 150 mL/hr over 120 Minutes Intravenous  Once 10/29/20 1639 10/29/20 1950   10/29/20 1630  vancomycin (VANCOCIN) IVPB 1000 mg/200 mL premix  Status:  Discontinued        1,000 mg 200 mL/hr over 60 Minutes Intravenous  Once 10/29/20 1621 10/29/20 1639        Objective:   Vitals:    11/07/20 1333 11/07/20 2105 11/08/20 0610 11/08/20 1432  BP: (!) 145/95 (!) 147/109 135/85 (!) 140/99  Pulse: 77 92 79 87  Resp: '17 19 18 20  ' Temp: 98.5 F (36.9 C) 97.6 F (36.4 C) 98 F (36.7 C) 97.9 F (36.6 C)  TempSrc: Oral  Oral Oral  SpO2: 99% 98% 98% 99%  Weight:      Height:        Wt Readings from Last 3 Encounters:  10/29/20 78.9 kg  10/09/20 79.4 kg  10/01/20 78.9 kg     Intake/Output Summary (Last 24 hours) at 11/08/2020 1934 Last data filed at 11/08/2020 1846 Gross per 24 hour  Intake 1210 ml  Output 1250 ml  Net -40 ml    Physical Exam  Gen:- Awake Alert,  In no apparent distress  HEENT:- [place].AT, No sclera icterus Neck-Supple Neck,No JVD,.  Lungs-  CTAB , fair symmetrical air movement CV- S1, S2 normal, regular  Abd-  +ve B.Sounds, Abd Soft, No tenderness,    Extremity/Skin:- No  edema, pedal pulses present  Psych-affect is appropriate, oriented x3 Neuro-generalized weakness, no new focal deficits, no tremors   Data Review:   Micro Results Recent Results (from the past 240 hour(s))  Urine culture     Status: Abnormal   Collection Time: 10/29/20  8:12 PM   Specimen: In/Out Cath Urine  Result Value Ref Range Status   Specimen Description   Final    IN/OUT CATH URINE Performed at [facility], [address], [place], [place] [address]    Special Requests   Final    Immunocompromised Performed at [facility], [address] [place], [place] [address]    Culture >=100,000 COLONIES/mL STAPHYLOCOCCUS AUREUS (  A)  Final   Report Status 11/01/2020 FINAL  Final   Organism ID, Bacteria STAPHYLOCOCCUS AUREUS (A)  Final      Susceptibility   Staphylococcus aureus - MIC*    CIPROFLOXACIN <=0.5 SENSITIVE Sensitive     GENTAMICIN <=0.5 SENSITIVE Sensitive     NITROFURANTOIN <=16 SENSITIVE Sensitive     OXACILLIN <=0.25 SENSITIVE Sensitive     TETRACYCLINE <=1 SENSITIVE Sensitive     VANCOMYCIN 1 SENSITIVE Sensitive     TRIMETH/SULFA <=10 SENSITIVE  Sensitive     CLINDAMYCIN <=0.25 SENSITIVE Sensitive     RIFAMPIN <=0.5 SENSITIVE Sensitive     Inducible Clindamycin NEGATIVE Sensitive     * >=100,000 COLONIES/mL STAPHYLOCOCCUS AUREUS  MRSA PCR Screening     Status: None   Collection Time: 10/30/20 12:17 AM   Specimen: Nasal Mucosa; Nasopharyngeal  Result Value Ref Range Status   MRSA by PCR NEGATIVE NEGATIVE Final    Comment:        The GeneXpert MRSA Assay (FDA approved for NASAL specimens only), is one component of a comprehensive MRSA colonization surveillance program. It is not intended to diagnose MRSA infection nor to guide or monitor treatment for MRSA infections. Performed at [facility], [address], [place], [place] [address]   C Difficile Quick Screen w PCR reflex     Status: Abnormal   Collection Time: 10/30/20 11:15 AM   Specimen: STOOL  Result Value Ref Range Status   C Diff antigen POSITIVE (A) NEGATIVE Final   C Diff toxin POSITIVE (A) NEGATIVE Final   C Diff interpretation Toxin producing C. difficile detected.  Final    Comment: CRITICAL RESULT CALLED TO, READ BACK BY AND VERIFIED WITH: FOLEY,BRITANY '@[id number]'  10/30/20 BY JONES,T Performed at [facility], [address], [place], [place] [address]   Resp Panel by RT-PCR (Flu A&B, Covid) Nasopharyngeal Swab     Status: None   Collection Time: 11/04/20 11:12 AM   Specimen: Nasopharyngeal Swab; Nasopharyngeal(NP) swabs in vial transport medium  Result Value Ref Range Status   SARS Coronavirus 2 by RT PCR NEGATIVE NEGATIVE Final    Comment: (NOTE) SARS-CoV-2 target nucleic acids are NOT DETECTED.  The SARS-CoV-2 RNA is generally detectable in upper respiratory specimens during the acute phase of infection. The lowest concentration of SARS-CoV-2 viral copies this assay can detect is 138 copies/mL. A negative result does not preclude SARS-Cov-2 infection and should not be used as the sole basis for treatment or other patient management decisions. A negative  result may occur with  improper specimen collection/handling, submission of specimen other than nasopharyngeal swab, presence of viral mutation(s) within the areas targeted by this assay, and inadequate number of viral copies(<138 copies/mL). A negative result must be combined with clinical observations, patient history, and epidemiological information. The expected result is Negative.  Fact Sheet for Patients:  EntrepreneurPulse.com.au  Fact Sheet for Healthcare Providers:  IncredibleEmployment.be  This test is no t yet approved or cleared by the [place] FDA and  has been authorized for detection and/or diagnosis of SARS-CoV-2 by FDA under an Emergency Use Authorization (EUA). This EUA will remain  in effect (meaning this test can be used) for the duration of the COVID-19 declaration under Section 564(b)(1) of the Act, 21 U.S.C.section 360bbb-3(b)(1), unless the authorization is terminated  or revoked sooner.       Influenza A by PCR NEGATIVE NEGATIVE Final   Influenza B by PCR NEGATIVE NEGATIVE Final    Comment: (NOTE) The Xpert  Xpress SARS-CoV-2/FLU/RSV plus assay is intended as an aid in the diagnosis of influenza from Nasopharyngeal swab specimens and should not be used as a sole basis for treatment. Nasal washings and aspirates are unacceptable for Xpert Xpress SARS-CoV-2/FLU/RSV testing.  Fact Sheet for Patients: EntrepreneurPulse.com.au  Fact Sheet for Healthcare Providers: IncredibleEmployment.be  This test is not yet approved or cleared by the [place] FDA and has been authorized for detection and/or diagnosis of SARS-CoV-2 by FDA under an Emergency Use Authorization (EUA). This EUA will remain in effect (meaning this test can be used) for the duration of the COVID-19 declaration under Section 564(b)(1) of the Act, 21 U.S.C. section 360bbb-3(b)(1), unless the authorization is  terminated or revoked.  Performed at [facility], [address] [name], [place] [address]     Radiology Reports CT ABDOMEN PELVIS WO CONTRAST  Result Date: 10/29/2020 CLINICAL DATA:  Nausea vomiting EXAM: CT ABDOMEN AND PELVIS WITHOUT CONTRAST TECHNIQUE: Multidetector CT imaging of the abdomen and pelvis was performed following the standard protocol without IV contrast. COMPARISON:  Radiograph 08/21/2020, CT 07/12/2018 FINDINGS: Lower chest: Lung bases demonstrate small bilateral pleural effusions. Hazy posterior lung bases likely atelectasis. Cardiomegaly. Mild circumferential distal esophageal thickening. Hepatobiliary: No focal liver abnormality is seen. No gallstones, gallbladder wall thickening, or biliary dilatation. Pancreas: Unremarkable. No pancreatic ductal dilatation or surrounding inflammatory changes. Spleen: Normal in size without focal abnormality. Adrenals/Urinary Tract: Adrenal glands are within normal limits. Kidneys show no hydronephrosis. Nonspecific perinephric fat stranding. Markedly distended urinary bladder. Stomach/Bowel: The stomach is nonenlarged. No dilated small bowel. Negative appendix. No acute bowel wall thickening. Vascular/Lymphatic: Moderate aortic atherosclerosis. No aneurysm. No suspicious nodes. Reproductive: Uterus is unremarkable.  No adnexal mass. Other: Negative for free air or free fluid. Musculoskeletal: Moderate superior endplate compression deformity at L1 with about 40% loss of height of anterior vertebral body. Degenerative changes most advanced at L3-L4 and L5-S1. IMPRESSION: 1. No CT evidence for acute intra-abdominal or pelvic abnormality. 2. Mild distal esophageal thickening, question esophagitis or reflux. 3. Small bilateral pleural effusions. Cardiomegaly. 4. Moderate superior endplate compression deformity at L1, of uncertain age, new since 2019. Aortic Atherosclerosis (ICD10-I70.0). Electronically Signed   By: Donavan Foil M.D.   On: 10/29/2020  18:57   DG CHEST PORT 1 VIEW  Result Date: 11/02/2020 CLINICAL DATA:  Chest pain when swallowing water EXAM: PORTABLE CHEST 1 VIEW COMPARISON:  10/29/2020 FINDINGS: Cardiomegaly and aortic tortuosity. Stable lung markings. There is no edema, consolidation, effusion, or pneumothorax. IMPRESSION: 1. No acute finding when compared to prior. 2. Cardiomegaly. Electronically Signed   By: Monte Fantasia M.D.   On: 11/02/2020 04:36   DG Chest Port 1 View  Result Date: 10/29/2020 CLINICAL DATA:  Questionable sepsis. EXAM: PORTABLE CHEST 1 VIEW COMPARISON:  August 19, 2020. FINDINGS: Enlarged cardiac silhouette, similar to prior. No confluent consolidation. Streaky left basilar opacity. No visible pleural effusions or pneumothorax on this semi upright portable radiograph. No acute osseous abnormality. IMPRESSION: Streaky left basilar opacity, favored to reflect atelectasis. No confluent consolidation. Electronically Signed   By: Margaretha Sheffield MD   On: 10/29/2020 15:04     CBC No results for input(s): WBC, HGB, HCT, PLT, MCV, MCH, MCHC, RDW, LYMPHSABS, MONOABS, EOSABS, BASOSABS, BANDABS in the last 168 hours.  Invalid input(s): NEUTRABS, BANDSABD  Chemistries  No results for input(s): NA, K, CL, CO2, GLUCOSE, BUN, CREATININE, CALCIUM, MG, AST, ALT, ALKPHOS, BILITOT in the last 168 hours.  Invalid input(s): GFRCGP ------------------------------------------------------------------------------------------------------------------ No results for input(s):  CHOL, HDL, LDLCALC, TRIG, CHOLHDL, LDLDIRECT in the last 72 hours.  Lab Results  Component Value Date   HGBA1C 13.0 (H) 10/29/2020   ------------------------------------------------------------------------------------------------------------------ No results for input(s): TSH, T4TOTAL, T3FREE, THYROIDAB in the last 72 hours.  Invalid input(s):  FREET3 ------------------------------------------------------------------------------------------------------------------ No results for input(s): VITAMINB12, FOLATE, FERRITIN, TIBC, IRON, RETICCTPCT in the last 72 hours.  Coagulation profile No results for input(s): INR, PROTIME in the last 168 hours.  No results for input(s): DDIMER in the last 72 hours.  Cardiac Enzymes No results for input(s): CKMB, TROPONINI, MYOGLOBIN in the last 168 hours.  Invalid input(s): CK ------------------------------------------------------------------------------------------------------------------    Component Value Date/Time   BNP 835.0 (H) 06/22/2017 8299     Roxan Hockey M.D on 11/08/2020 at 7:34 PM  Go to [email or web address] - for contact info  [facility] - Office  [phone]

## 2020-11-08 NOTE — Progress Notes (Addendum)
Patient refusing all of her medications this morning, stating that she does not care if the nurse "throws them away" because she did not like her breakfast. Offered to let her speak with the kitchen and order something else, dialed the phone and handed it to her. Will attempt to revisit after patient has eaten more breakfast.  Patient continuing to refuse meds, stating that she won't take them since she didn't eat. States she will "fire all the kitchen staff." RN offered different food options but patient refused.

## 2020-11-09 LAB — COMPREHENSIVE METABOLIC PANEL
ALT: 41 U/L (ref 0–44)
AST: 38 U/L (ref 15–41)
Albumin: 2 g/dL — ABNORMAL LOW (ref 3.5–5.0)
Alkaline Phosphatase: 83 U/L (ref 38–126)
Anion gap: 7 (ref 5–15)
BUN: 9 mg/dL (ref 6–20)
CO2: 23 mmol/L (ref 22–32)
Calcium: 8.1 mg/dL — ABNORMAL LOW (ref 8.9–10.3)
Chloride: 111 mmol/L (ref 98–111)
Creatinine, Ser: 1.36 mg/dL — ABNORMAL HIGH (ref 0.44–1.00)
GFR, Estimated: 46 mL/min — ABNORMAL LOW (ref >60)
Glucose, Bld: 238 mg/dL — ABNORMAL HIGH (ref 70–99)
Potassium: 4.2 mmol/L (ref 3.5–5.1)
Sodium: 141 mmol/L (ref 135–145)
Total Bilirubin: 0.4 mg/dL (ref 0.3–1.2)
Total Protein: 5.4 g/dL — ABNORMAL LOW (ref 6.5–8.1)

## 2020-11-09 LAB — CBC
HCT: 36.2 % (ref 36.0–46.0)
Hemoglobin: 11.2 g/dL — ABNORMAL LOW (ref 12.0–15.0)
MCH: 29.2 pg (ref 26.0–34.0)
MCHC: 30.9 g/dL (ref 30.0–36.0)
MCV: 94.5 fL (ref 80.0–100.0)
Platelets: 255 10*3/uL (ref 150–400)
RBC: 3.83 MIL/uL — ABNORMAL LOW (ref 3.87–5.11)
RDW: 13.9 % (ref 11.5–15.5)
WBC: 7.3 10*3/uL (ref 4.0–10.5)
nRBC: 0 % (ref 0.0–0.2)

## 2020-11-09 LAB — GLUCOSE, CAPILLARY
Glucose-Capillary: 152 mg/dL — ABNORMAL HIGH (ref 70–99)
Glucose-Capillary: 183 mg/dL — ABNORMAL HIGH (ref 70–99)
Glucose-Capillary: 119 mg/dL — ABNORMAL HIGH (ref 70–99)

## 2020-11-09 NOTE — Progress Notes (Signed)
Patient requesting regular soda which is outside of her diet and supplies are running low.  Explained this to patient and offered alternative, but these were all refused. Patient now refusing her medications and asking for cake and ice cream.

## 2020-11-09 NOTE — Progress Notes (Signed)
Patient Demographics:    [name], is a 55 y.o. female, DOB - [birth year], ZOX:[id number]  Admit date - 10/29/2020   Admitting Physician Roney Jaffe, MD  Outpatient Primary MD for the patient is [name], MD  LOS - 11   Chief Complaint  Patient presents with  . Hypotension        Subjective:    [name] today has no fevers, no emesis,  No chest pain,    Intake is okay  -11/09/20-- -patient remains medically stable for discharge to SNF when bed available  Assessment  & [name] :    Principal Problem:   DKA (diabetic ketoacidosis) ([place]) Active Problems:   Essential hypertension   Chronic pain syndrome   Chronic combined systolic and diastolic CHF (congestive heart failure) (HCC)   COPD (chronic obstructive pulmonary disease) (HCC)   AKI (acute kidney injury) ([place])   Atrial fibrillation with RVR (HCC)   Seizures ([place])   Shock ([place])   Brief Admission History:  55 y.o.year-old w/ hx of CVA, seizures, obesity, tobacco abuse, CM EF 25%, HL, hx GIB, HTN, diabetes on insulin, Charcot foot, COPD, cocaine abuse, CKD 3, chronic syst/ diast CHF, anemia, etoh use presented to ED by EMS w/ c/o laying in bed for 2 days w/o medications. She had reportedly been vomiting and having diarrhea and has not been able to keep down her medication. EMS reported pt's boyfriend left 2 days ago and did not return, we has helping to take care of her and give her her medications. EMS BP 80/60, temp 94.5, HR 160 and CBG 414. In ED RR 31, temp 94.8, HR 145 - 162, RA 97% sat, BP 70/30 >>150/70 after cardioversion. BS >600, Na 133 CO2 17 AG 25 Glu 874 BUN 61 Cr 2.47 Ca 8.8 Alb 3.2 LFT's okay, egfr 22 WBC 17K Hb 16.6 plt 327 Lactic 2.3 >1.9 Beta-hydroxy ^ 7.16, tsh 0.5 CXR showed LLL atelectasis, noconsolidation.EKG showed wide-complex tachycardia at 181 bpm afib w/ RVR, QRS 125 msec. Repeat  ekg after cardioversion x 2 (120J, 150J) showed sinus tach at 104 bpm. In ED she was cardioverted as above and started on IV insulin drip and received IV vanc 1500 mg x 1, IV zosyn also ordered. Got 4 L total bolus IVF"s. Blood and urine cx's sent. Asked to see for admission.   Pt seen in room, lives alone, uses a walker or wheelchair mostly, needs help caring for herself. Denies any recent CP or abd pain, +n/v and diarrhea, no fevers or chills. Vague historian.  11/09/20-- -patient remains medically stable for discharge to SNF when bed available  Assessment & [name]:   Principal Problem:   DKA (diabetic ketoacidosis) ([place]) Active Problems:   Essential hypertension   Chronic pain syndrome   Chronic combined systolic and diastolic CHF (congestive heart failure) (HCC)   COPD (chronic obstructive pulmonary disease) ([place])   AKI (acute kidney injury) ([place])   Atrial fibrillation with RVR ([place])   Seizures ([place])   Shock ([place])  A/p 1)Atrial fibrillation with RVR - pt s/p 2 cardioversions performed in ED by ED staff.-Remains in sinus rhythm, continue metoprolol for rate control and apixaban for stroke prophylaxis -CBC and BMP from 11/09/2020 noted  2)C diff infection  with diarrhea --overall much improved, continue oral Vanco, Rx given  3)DKA--on admission, resolved, off IV insulin, continue subcu scheduled Lantus insulin along with NovoLog sliding scale  4)generalized weakness and debilitation -PT recommended SNF placement.,Unsteady gait, high fall risk, pt agres to  SNF Rehabtransfer  5)Chronic combined sys/diastolic heart failure - stable, okay to use low-dose Lasix 20 mg daily =-Echo with EF of 50 to 55%  6)Epilepsy -stable, continue Keppra  7)COPD - stable,no acute exacerbation at this time, no need for steroids,continue bronchodilators  8)Depression -continue Cymbalta  DVT prophylaxis: apixaban  Code Status: Full  Family Communication: none present   Disposition:  SNF placement  (awaiting isolation bed at SNF)  Status is: Inpatient  Remains inpatient appropriate because:IV treatments appropriate due to intensity of illness or inability to take PO and Inpatient level of care appropriate due to severity of illness  Dispo: The patient is from: Home  Anticipated d/c is to: Home  Anticipated d/c date is: 1 day  Patient currently is medically stable to d/c.  Awaiting for SNF to have an isolation bed (due to patient having C diff infection).  -11/09/20-- -patient remains medically stable for discharge to SNF when bed available  Consultants:     Procedures:    Antimicrobials:  Vancomycin 1/6>>    Disposition/Need for in-Hospital Stay- patient unable to be discharged at this time due to awaiting SNF bed transfer   Status is: Inpatient  Remains inpatient appropriate because:Please see above   Disposition: The patient is from: Home              Anticipated d/c is to: SNF              Anticipated d/c date is: 3 days              Patient currently is medically stable to d/c.  Code Status :  -  Code Status: Full Code   Family Communication:    NA (patient is alert, awake and coherent)   Consults  :    DVT Prophylaxis  :   - SCDs   apixaban (ELIQUIS) tablet 5 mg    Lab Results  Component Value Date   PLT 255 11/09/2020    Inpatient Medications  Scheduled Meds: . amLODipine  10 mg Oral Daily  . apixaban  5 mg Oral BID  . clopidogrel  75 mg Oral Daily  . DULoxetine  60 mg Oral Daily  . gabapentin  300 mg Oral TID  . guaiFENesin  1,200 mg Oral BID  . insulin aspart  0-15 Units Subcutaneous TID WC  . insulin aspart  0-5 Units Subcutaneous QHS  . insulin aspart  3 Units Subcutaneous TID WC  . insulin glargine  9 Units Subcutaneous Q24H  . levETIRAcetam  750 mg Oral BID  . metoprolol succinate  50 mg Oral BID  . montelukast  10 mg Oral QHS  . pantoprazole  40 mg Oral BID   . rosuvastatin  10 mg Oral Daily  . saccharomyces boulardii  250 mg Oral BID   Continuous Infusions: PRN Meds:.acetaminophen, albuterol, alum & mag hydroxide-simeth, hydrALAZINE, ondansetron (ZOFRAN) IV, umeclidinium bromide    Anti-infectives (From admission, onward)   Start     Dose/Rate Route Frequency Ordered Stop   11/07/20 0000  vancomycin (VANCOCIN) 50 mg/mL oral solution        125 mg Oral 4 times daily 11/07/20 1047 11/12/20 2359   11/04/20 0000  vancomycin (VANCOCIN) 50 mg/mL oral solution  Status:  Discontinued        125 mg Oral 4 times daily 11/04/20 1201 11/07/20    11/03/20 1030  fluconazole (DIFLUCAN) tablet 150 mg        150 mg Oral  Once 11/03/20 0939 11/03/20 1320   10/31/20 1700  vancomycin (VANCOCIN) IVPB 1000 mg/200 mL premix  Status:  Discontinued        1,000 mg 200 mL/hr over 60 Minutes Intravenous Every 48 hours 10/29/20 1640 10/30/20 0828   10/30/20 1530  vancomycin (VANCOCIN) 50 mg/mL oral solution 125 mg        125 mg Oral 4 times daily 10/30/20 1434 11/09/20 1359   10/30/20 0200  piperacillin-tazobactam (ZOSYN) IVPB 3.375 g  Status:  Discontinued        3.375 g 12.5 mL/hr over 240 Minutes Intravenous Every 8 hours 10/29/20 1650 10/30/20 1602   10/29/20 1945  piperacillin-tazobactam (ZOSYN) IVPB 3.375 g  Status:  Discontinued        3.375 g 100 mL/hr over 30 Minutes Intravenous  Once 10/29/20 1936 10/29/20 1941   10/29/20 1700  piperacillin-tazobactam (ZOSYN) IVPB 3.375 g        3.375 g 100 mL/hr over 30 Minutes Intravenous  Once 10/29/20 1648 10/29/20 2007   10/29/20 1645  vancomycin (VANCOREADY) IVPB 1500 mg/300 mL        1,500 mg 150 mL/hr over 120 Minutes Intravenous  Once 10/29/20 1639 10/29/20 1950   10/29/20 1630  vancomycin (VANCOCIN) IVPB 1000 mg/200 mL premix  Status:  Discontinued        1,000 mg 200 mL/hr over 60 Minutes Intravenous  Once 10/29/20 1621 10/29/20 1639        Objective:   Vitals:   11/08/20 1432 11/08/20 2050 11/09/20  0528 11/09/20 1359  BP: (!) 140/99 (!) 152/86 (!) 141/95 (!) 130/108  Pulse: 87 91 84 92  Resp: '20 19 17   ' Temp: 97.9 F (36.6 C) 99 F (37.2 C) 98.9 F (37.2 C) 99.1 F (37.3 C)  TempSrc: Oral Oral  Oral  SpO2: 99% 100% 96% 100%  Weight:      Height:        Wt Readings from Last 3 Encounters:  10/29/20 78.9 kg  10/09/20 79.4 kg  10/01/20 78.9 kg     Intake/Output Summary (Last 24 hours) at 11/09/2020 1637 Last data filed at 11/09/2020 1300 Gross per 24 hour  Intake 720 ml  Output 1100 ml  Net -380 ml    Physical Exam  Gen:- Awake Alert,  In no apparent distress  HEENT:- [place].AT, No sclera icterus Neck-Supple Neck,No JVD,.  Lungs-  CTAB , fair symmetrical air movement CV- S1, S2 normal, regular  Abd-  +ve B.Sounds, Abd Soft, No tenderness,    Extremity/Skin:- No  edema, pedal pulses present  Psych-affect is appropriate, oriented x3 Neuro-generalized weakness, no new focal deficits, no tremors   Data Review:   Micro Results Recent Results (from the past 240 hour(s))  Resp Panel by RT-PCR (Flu A&B, Covid) Nasopharyngeal Swab     Status: None   Collection Time: 11/04/20 11:12 AM   Specimen: Nasopharyngeal Swab; Nasopharyngeal(NP) swabs in vial transport medium  Result Value Ref Range Status   SARS Coronavirus 2 by RT PCR NEGATIVE NEGATIVE Final    Comment: (NOTE) SARS-CoV-2 target nucleic acids are NOT DETECTED.  The SARS-CoV-2 RNA is generally detectable in upper respiratory specimens during the acute phase of infection. The lowest concentration of SARS-CoV-2 viral copies this assay  can detect is 138 copies/mL. A negative result does not preclude SARS-Cov-2 infection and should not be used as the sole basis for treatment or other patient management decisions. A negative result may occur with  improper specimen collection/handling, submission of specimen other than nasopharyngeal swab, presence of viral mutation(s) within the areas targeted by this assay, and  inadequate number of viral copies(<138 copies/mL). A negative result must be combined with clinical observations, patient history, and epidemiological information. The expected result is Negative.  Fact Sheet for Patients:  EntrepreneurPulse.com.au  Fact Sheet for Healthcare Providers:  IncredibleEmployment.be  This test is no t yet approved or cleared by the [place] FDA and  has been authorized for detection and/or diagnosis of SARS-CoV-2 by FDA under an Emergency Use Authorization (EUA). This EUA will remain  in effect (meaning this test can be used) for the duration of the COVID-19 declaration under Section 564(b)(1) of the Act, 21 U.S.C.section 360bbb-3(b)(1), unless the authorization is terminated  or revoked sooner.       Influenza A by PCR NEGATIVE NEGATIVE Final   Influenza B by PCR NEGATIVE NEGATIVE Final    Comment: (NOTE) The Xpert Xpress SARS-CoV-2/FLU/RSV plus assay is intended as an aid in the diagnosis of influenza from Nasopharyngeal swab specimens and should not be used as a sole basis for treatment. Nasal washings and aspirates are unacceptable for Xpert Xpress SARS-CoV-2/FLU/RSV testing.  Fact Sheet for Patients: EntrepreneurPulse.com.au  Fact Sheet for Healthcare Providers: IncredibleEmployment.be  This test is not yet approved or cleared by the [place] FDA and has been authorized for detection and/or diagnosis of SARS-CoV-2 by FDA under an Emergency Use Authorization (EUA). This EUA will remain in effect (meaning this test can be used) for the duration of the COVID-19 declaration under Section 564(b)(1) of the Act, 21 U.S.C. section 360bbb-3(b)(1), unless the authorization is terminated or revoked.  Performed at [facility], [address] [place], [place] [address]     Radiology Reports CT ABDOMEN PELVIS WO CONTRAST  Result Date: 10/29/2020 CLINICAL DATA:  Nausea  vomiting EXAM: CT ABDOMEN AND PELVIS WITHOUT CONTRAST TECHNIQUE: Multidetector CT imaging of the abdomen and pelvis was performed following the standard protocol without IV contrast. COMPARISON:  Radiograph 08/21/2020, CT 07/12/2018 FINDINGS: Lower chest: Lung bases demonstrate small bilateral pleural effusions. Hazy posterior lung bases likely atelectasis. Cardiomegaly. Mild circumferential distal esophageal thickening. Hepatobiliary: No focal liver abnormality is seen. No gallstones, gallbladder wall thickening, or biliary dilatation. Pancreas: Unremarkable. No pancreatic ductal dilatation or surrounding inflammatory changes. Spleen: Normal in size without focal abnormality. Adrenals/Urinary Tract: Adrenal glands are within normal limits. Kidneys show no hydronephrosis. Nonspecific perinephric fat stranding. Markedly distended urinary bladder. Stomach/Bowel: The stomach is nonenlarged. No dilated small bowel. Negative appendix. No acute bowel wall thickening. Vascular/Lymphatic: Moderate aortic atherosclerosis. No aneurysm. No suspicious nodes. Reproductive: Uterus is unremarkable.  No adnexal mass. Other: Negative for free air or free fluid. Musculoskeletal: Moderate superior endplate compression deformity at L1 with about 40% loss of height of anterior vertebral body. Degenerative changes most advanced at L3-L4 and L5-S1. IMPRESSION: 1. No CT evidence for acute intra-abdominal or pelvic abnormality. 2. Mild distal esophageal thickening, question esophagitis or reflux. 3. Small bilateral pleural effusions. Cardiomegaly. 4. Moderate superior endplate compression deformity at L1, of uncertain age, new since 2019. Aortic Atherosclerosis (ICD10-I70.0). Electronically Signed   By: Donavan Foil M.D.   On: 10/29/2020 18:57   DG CHEST PORT 1 VIEW  Result Date: 11/02/2020 CLINICAL DATA:  Chest pain when  swallowing water EXAM: PORTABLE CHEST 1 VIEW COMPARISON:  10/29/2020 FINDINGS: Cardiomegaly and aortic tortuosity.  Stable lung markings. There is no edema, consolidation, effusion, or pneumothorax. IMPRESSION: 1. No acute finding when compared to prior. 2. Cardiomegaly. Electronically Signed   By: Monte Fantasia M.D.   On: 11/02/2020 04:36   DG Chest Port 1 View  Result Date: 10/29/2020 CLINICAL DATA:  Questionable sepsis. EXAM: PORTABLE CHEST 1 VIEW COMPARISON:  August 19, 2020. FINDINGS: Enlarged cardiac silhouette, similar to prior. No confluent consolidation. Streaky left basilar opacity. No visible pleural effusions or pneumothorax on this semi upright portable radiograph. No acute osseous abnormality. IMPRESSION: Streaky left basilar opacity, favored to reflect atelectasis. No confluent consolidation. Electronically Signed   By: Margaretha Sheffield MD   On: 10/29/2020 15:04     CBC Recent Labs  Lab 11/09/20 1034  WBC 7.3  HGB 11.2*  HCT 36.2  PLT 255  MCV 94.5  MCH 29.2  MCHC 30.9  RDW 13.9    Chemistries  Recent Labs  Lab 11/09/20 1034  NA 141  K 4.2  CL 111  CO2 23  GLUCOSE 238*  BUN 9  CREATININE 1.36*  CALCIUM 8.1*  AST 38  ALT 41  ALKPHOS 83  BILITOT 0.4   ------------------------------------------------------------------------------------------------------------------ No results for input(s): CHOL, HDL, LDLCALC, TRIG, CHOLHDL, LDLDIRECT in the last 72 hours.  Lab Results  Component Value Date   HGBA1C 13.0 (H) 10/29/2020   ------------------------------------------------------------------------------------------------------------------ No results for input(s): TSH, T4TOTAL, T3FREE, THYROIDAB in the last 72 hours.  Invalid input(s): FREET3 ------------------------------------------------------------------------------------------------------------------ No results for input(s): VITAMINB12, FOLATE, FERRITIN, TIBC, IRON, RETICCTPCT in the last 72 hours.  Coagulation profile No results for input(s): INR, PROTIME in the last 168 hours.  No results for input(s): DDIMER  in the last 72 hours.  Cardiac Enzymes No results for input(s): CKMB, TROPONINI, MYOGLOBIN in the last 168 hours.  Invalid input(s): CK ------------------------------------------------------------------------------------------------------------------    Component Value Date/Time   BNP 835.0 (H) 06/22/2017 4287     Roxan Hockey M.D on 11/09/2020 at 4:37 PM  Go to [email or web address] - for contact info  [facility] - Office  [phone]

## 2020-11-09 NOTE — Progress Notes (Signed)
Pt insisted on ordering take out from a pizza place. This nurse educated pt on the risks of eating take out related to blood glucose levels rising. Pt instead ordered pizza and got it delivered to the hospital. MD notified of pts actions. Pt was given pizza along with diet soda she ordered. CBG was checked and insulin coverage was given accordingly.

## 2020-11-10 LAB — GLUCOSE, CAPILLARY
Glucose-Capillary: 164 mg/dL — ABNORMAL HIGH (ref 70–99)
Glucose-Capillary: 148 mg/dL — ABNORMAL HIGH (ref 70–99)
Glucose-Capillary: 99 mg/dL (ref 70–99)
Glucose-Capillary: 154 mg/dL — ABNORMAL HIGH (ref 70–99)
Glucose-Capillary: 176 mg/dL — ABNORMAL HIGH (ref 70–99)

## 2020-11-10 MED ORDER — PANTOPRAZOLE SODIUM 40 MG IV SOLR
40.0000 mg | Freq: Two times a day (BID) | INTRAVENOUS | Status: DC
  Administered 2020-11-10 – 2020-11-13 (×5): 40 mg via INTRAVENOUS
  Filled 2020-11-10 (×6): qty 40

## 2020-11-10 NOTE — Progress Notes (Signed)
  Concerns about 1 episode of "bloody" emesis - -Change Protonix to IV every 12 hours -Check H&H every 6 hours x3 -Check vital signs every 4 hours x3 -Hold Plavix and Eliquis  Roxan Hockey, MD

## 2020-11-10 NOTE — Progress Notes (Signed)
Patient had one episode of bright red bloody emesis. Patient also complains of acute pain to mid abdomen and rates pain as 8/10 (on a scale of 0-10). Patient also had a dark green bowel movement.Patient denies any other discomfort. Vital signs are as follows     11/10/20 1653  Vitals  Temp 99 F (37.2 C)  Temp Source Oral  BP 124/79  BP Location Right Arm  BP Method Automatic  Patient Position (if appropriate) Lying  Pulse Rate 90  Level of Consciousness  Level of Consciousness Alert  Pain Assessment  Pain Scale 0-10  Pain Score 8  Pain Location Abdomen  Pain Orientation Mid  Pain Descriptors / Indicators Aching;Discomfort  Pain Frequency Constant  Pain Onset On-going  Patients Stated Pain Goal 0  Pain Intervention(s) Emotional support;Environmental changes;Rest;Relaxation  Multiple Pain Sites No  POSS Scale ([place] Opioid Sedation Scale)  POSS *See Group Information* 1-Acceptable,Awake and alert    MD has been notified.

## 2020-11-10 NOTE — Progress Notes (Addendum)
Patient Demographics:    [name], is a 55 y.o. female, DOB - 01-09-1966, YBW:[id number]  Admit date - 10/29/2020   Admitting Physician Roney Jaffe, MD  Outpatient Primary MD for the patient is [name], MD  LOS - 12   Chief Complaint  Patient presents with  . Hypotension        Subjective:    [name] today has no fevers, no emesis,  No chest pain,    -11/10/20-- -patient remains medically stable for discharge to SNF when bed available -Patient is noncompliant with carb modified diet  Assessment  & Plan :    Principal Problem:   DKA (diabetic ketoacidosis) ([place]) Active Problems:   Essential hypertension   Chronic pain syndrome   Chronic combined systolic and diastolic CHF (congestive heart failure) (HCC)   COPD (chronic obstructive pulmonary disease) (HCC)   AKI (acute kidney injury) ([place])   Atrial fibrillation with RVR (HCC)   Seizures ([place])   Shock ([place])   Brief Admission History:  55 y.o.year-old w/ hx of CVA, seizures, obesity, tobacco abuse, CM EF 25%, HL, hx GIB, HTN, diabetes on insulin, Charcot foot, COPD, cocaine abuse, CKD 3, chronic syst/ diast CHF, anemia, etoh use presented to ED by EMS w/ c/o laying in bed for 2 days w/o medications. She had reportedly been vomiting and having diarrhea and has not been able to keep down her medication. EMS reported pt's boyfriend left 2 days ago and did not return, we has helping to take care of her and give her her medications. EMS BP 80/60, temp 94.5, HR 160 and CBG 414. In ED RR 31, temp 94.8, HR 145 - 162, RA 97% sat, BP 70/30 >>150/70 after cardioversion. BS >600, Na 133 CO2 17 AG 25 Glu 874 BUN 61 Cr 2.47 Ca 8.8 Alb 3.2 LFT's okay, egfr 22 WBC 17K Hb 16.6 plt 327 Lactic 2.3 >1.9 Beta-hydroxy ^ 7.16, tsh 0.5 CXR showed LLL atelectasis, noconsolidation.EKG showed wide-complex tachycardia at 181 bpm  afib w/ RVR, QRS 125 msec. Repeat ekg after cardioversion x 2 (120J, 150J) showed sinus tach at 104 bpm. In ED she was cardioverted as above and started on IV insulin drip and received IV vanc 1500 mg x 1, IV zosyn also ordered. Got 4 L total bolus IVF"s. Blood and urine cx's sent. Asked to see for admission.   Pt seen in room, lives alone, uses a walker or wheelchair mostly, needs help caring for herself. Denies any recent CP or abd pain, +n/v and diarrhea, no fevers or chills. Vague historian.  11/10/20-- -patient remains medically stable for discharge to SNF when bed available  Assessment & Plan:   Principal Problem:   DKA (diabetic ketoacidosis) ([place]) Active Problems:   Essential hypertension   Chronic pain syndrome   Chronic combined systolic and diastolic CHF (congestive heart failure) (HCC)   COPD (chronic obstructive pulmonary disease) ([place])   AKI (acute kidney injury) ([place])   Atrial fibrillation with RVR ([place])   Seizures ([place])   Shock ([place])  A/p 1)Atrial fibrillation with RVR - pt s/p 2 cardioversions performed in ED by ED staff.-Remains in sinus rhythm, continue metoprolol for rate control and apixaban for stroke prophylaxis -CBC and BMP from 11/09/2020 noted  2)C diff infection with diarrhea --overall much improved,  Completed oral Vanco on 11/09/20  3)DKA--on admission, resolved, off IV insulin, continue subcu scheduled Lantus insulin along with NovoLog sliding scale  4)generalized weakness and debilitation -PT recommended SNF placement.,Unsteady gait, high fall risk, pt agres to  SNF Rehabtransfer  5)Chronic combined sys/diastolic heart failure - stable, okay to use low-dose Lasix 20 mg daily =-Echo with EF of 50 to 55%  6)Epilepsy -stable, continue Keppra  7)COPD - stable,no acute exacerbation at this time, no need for steroids,continue bronchodilators  8)Depression -continue Cymbalta  DVT prophylaxis: apixaban  Code Status: Full  Family  Communication: none present  Disposition:  SNF placement  (awaiting isolation bed at SNF) -Disposition/Need for in-Hospital Stay- patient unable to be discharged at this time due to awaiting SNF bed transfer   Status is: Inpatient  Remains inpatient appropriate because:Please see above   Disposition: The patient is from: Home              Anticipated d/c is to: SNF              Anticipated d/c date is: 2 days              Patient currently is medically stable to d/c.  -11/10/20-- -patient remains medically stable for discharge to SNF when bed available  Consultants:     Procedures:    Antimicrobials:  Vancomycin 1/6>>      Code Status :  -  Code Status: Full Code   Family Communication:    NA (patient is alert, awake and coherent)   Consults  :    DVT Prophylaxis  :   - SCDs   apixaban (ELIQUIS) tablet 5 mg    Lab Results  Component Value Date   PLT 255 11/09/2020    Inpatient Medications  Scheduled Meds: . amLODipine  10 mg Oral Daily  . apixaban  5 mg Oral BID  . clopidogrel  75 mg Oral Daily  . DULoxetine  60 mg Oral Daily  . gabapentin  300 mg Oral TID  . guaiFENesin  1,200 mg Oral BID  . insulin aspart  0-15 Units Subcutaneous TID WC  . insulin aspart  0-5 Units Subcutaneous QHS  . insulin aspart  3 Units Subcutaneous TID WC  . insulin glargine  9 Units Subcutaneous Q24H  . levETIRAcetam  750 mg Oral BID  . metoprolol succinate  50 mg Oral BID  . montelukast  10 mg Oral QHS  . pantoprazole  40 mg Oral BID  . rosuvastatin  10 mg Oral Daily  . saccharomyces boulardii  250 mg Oral BID   Continuous Infusions: PRN Meds:.acetaminophen, albuterol, alum & mag hydroxide-simeth, hydrALAZINE, ondansetron (ZOFRAN) IV, umeclidinium bromide    Anti-infectives (From admission, onward)   Start     Dose/Rate Route Frequency Ordered Stop   11/07/20 0000  vancomycin (VANCOCIN) 50 mg/mL oral solution        125 mg Oral 4 times daily 11/07/20 1047  11/12/20 2359   11/04/20 0000  vancomycin (VANCOCIN) 50 mg/mL oral solution  Status:  Discontinued        125 mg Oral 4 times daily 11/04/20 1201 11/07/20    11/03/20 1030  fluconazole (DIFLUCAN) tablet 150 mg        150 mg Oral  Once 11/03/20 0939 11/03/20 1320   10/31/20 1700  vancomycin (VANCOCIN) IVPB 1000 mg/200 mL premix  Status:  Discontinued        1,000 mg  200 mL/hr over 60 Minutes Intravenous Every 48 hours 10/29/20 1640 10/30/20 0828   10/30/20 1530  vancomycin (VANCOCIN) 50 mg/mL oral solution 125 mg        125 mg Oral 4 times daily 10/30/20 1434 11/09/20 1359   10/30/20 0200  piperacillin-tazobactam (ZOSYN) IVPB 3.375 g  Status:  Discontinued        3.375 g 12.5 mL/hr over 240 Minutes Intravenous Every 8 hours 10/29/20 1650 10/30/20 1602   10/29/20 1945  piperacillin-tazobactam (ZOSYN) IVPB 3.375 g  Status:  Discontinued        3.375 g 100 mL/hr over 30 Minutes Intravenous  Once 10/29/20 1936 10/29/20 1941   10/29/20 1700  piperacillin-tazobactam (ZOSYN) IVPB 3.375 g        3.375 g 100 mL/hr over 30 Minutes Intravenous  Once 10/29/20 1648 10/29/20 2007   10/29/20 1645  vancomycin (VANCOREADY) IVPB 1500 mg/300 mL        1,500 mg 150 mL/hr over 120 Minutes Intravenous  Once 10/29/20 1639 10/29/20 1950   10/29/20 1630  vancomycin (VANCOCIN) IVPB 1000 mg/200 mL premix  Status:  Discontinued        1,000 mg 200 mL/hr over 60 Minutes Intravenous  Once 10/29/20 1621 10/29/20 1639        Objective:   Vitals:   11/09/20 0528 11/09/20 1359 11/09/20 2023 11/10/20 0249  BP: (!) 141/95 (!) 130/108 (!) 153/100 (!) 129/99  Pulse: 84 92 87 77  Resp: '17  18 18  ' Temp: 98.9 F (37.2 C) 99.1 F (37.3 C) 99.3 F (37.4 C) 99.5 F (37.5 C)  TempSrc:  Oral Oral Oral  SpO2: 96% 100% 97% 99%  Weight:      Height:        Wt Readings from Last 3 Encounters:  10/29/20 78.9 kg  10/09/20 79.4 kg  10/01/20 78.9 kg     Intake/Output Summary (Last 24 hours) at 11/10/2020 0851 Last  data filed at 11/10/2020 0000 Gross per 24 hour  Intake 600 ml  Output 700 ml  Net -100 ml    Physical Exam  Gen:- Awake Alert,  In no apparent distress  HEENT:- [place].AT, No sclera icterus Neck-Supple Neck,No JVD,.  Lungs-  CTAB , fair symmetrical air movement CV- S1, S2 normal, regular  Abd-  +ve B.Sounds, Abd Soft, No tenderness,    Extremity/Skin:- No  edema, pedal pulses present  Psych-affect is appropriate, oriented x3 Neuro-generalized weakness, no new focal deficits, no tremors   Data Review:   Micro Results Recent Results (from the past 240 hour(s))  Resp Panel by RT-PCR (Flu A&B, Covid) Nasopharyngeal Swab     Status: None   Collection Time: 11/04/20 11:12 AM   Specimen: Nasopharyngeal Swab; Nasopharyngeal(NP) swabs in vial transport medium  Result Value Ref Range Status   SARS Coronavirus 2 by RT PCR NEGATIVE NEGATIVE Final    Comment: (NOTE) SARS-CoV-2 target nucleic acids are NOT DETECTED.  The SARS-CoV-2 RNA is generally detectable in upper respiratory specimens during the acute phase of infection. The lowest concentration of SARS-CoV-2 viral copies this assay can detect is 138 copies/mL. A negative result does not preclude SARS-Cov-2 infection and should not be used as the sole basis for treatment or other patient management decisions. A negative result may occur with  improper specimen collection/handling, submission of specimen other than nasopharyngeal swab, presence of viral mutation(s) within the areas targeted by this assay, and inadequate number of viral copies(<138 copies/mL). A negative result must be combined with  clinical observations, patient history, and epidemiological information. The expected result is Negative.  Fact Sheet for Patients:  EntrepreneurPulse.com.au  Fact Sheet for Healthcare Providers:  IncredibleEmployment.be  This test is no t yet approved or cleared by the [place] FDA and  has been  authorized for detection and/or diagnosis of SARS-CoV-2 by FDA under an Emergency Use Authorization (EUA). This EUA will remain  in effect (meaning this test can be used) for the duration of the COVID-19 declaration under Section 564(b)(1) of the Act, 21 U.S.C.section 360bbb-3(b)(1), unless the authorization is terminated  or revoked sooner.       Influenza A by PCR NEGATIVE NEGATIVE Final   Influenza B by PCR NEGATIVE NEGATIVE Final    Comment: (NOTE) The Xpert Xpress SARS-CoV-2/FLU/RSV plus assay is intended as an aid in the diagnosis of influenza from Nasopharyngeal swab specimens and should not be used as a sole basis for treatment. Nasal washings and aspirates are unacceptable for Xpert Xpress SARS-CoV-2/FLU/RSV testing.  Fact Sheet for Patients: EntrepreneurPulse.com.au  Fact Sheet for Healthcare Providers: IncredibleEmployment.be  This test is not yet approved or cleared by the [place] FDA and has been authorized for detection and/or diagnosis of SARS-CoV-2 by FDA under an Emergency Use Authorization (EUA). This EUA will remain in effect (meaning this test can be used) for the duration of the COVID-19 declaration under Section 564(b)(1) of the Act, 21 U.S.C. section 360bbb-3(b)(1), unless the authorization is terminated or revoked.  Performed at [facility], [address] [place], [place] [address]     Radiology Reports CT ABDOMEN PELVIS WO CONTRAST  Result Date: 10/29/2020 CLINICAL DATA:  Nausea vomiting EXAM: CT ABDOMEN AND PELVIS WITHOUT CONTRAST TECHNIQUE: Multidetector CT imaging of the abdomen and pelvis was performed following the standard protocol without IV contrast. COMPARISON:  Radiograph 08/21/2020, CT 07/12/2018 FINDINGS: Lower chest: Lung bases demonstrate small bilateral pleural effusions. Hazy posterior lung bases likely atelectasis. Cardiomegaly. Mild circumferential distal esophageal thickening. Hepatobiliary:  No focal liver abnormality is seen. No gallstones, gallbladder wall thickening, or biliary dilatation. Pancreas: Unremarkable. No pancreatic ductal dilatation or surrounding inflammatory changes. Spleen: Normal in size without focal abnormality. Adrenals/Urinary Tract: Adrenal glands are within normal limits. Kidneys show no hydronephrosis. Nonspecific perinephric fat stranding. Markedly distended urinary bladder. Stomach/Bowel: The stomach is nonenlarged. No dilated small bowel. Negative appendix. No acute bowel wall thickening. Vascular/Lymphatic: Moderate aortic atherosclerosis. No aneurysm. No suspicious nodes. Reproductive: Uterus is unremarkable.  No adnexal mass. Other: Negative for free air or free fluid. Musculoskeletal: Moderate superior endplate compression deformity at L1 with about 40% loss of height of anterior vertebral body. Degenerative changes most advanced at L3-L4 and L5-S1. IMPRESSION: 1. No CT evidence for acute intra-abdominal or pelvic abnormality. 2. Mild distal esophageal thickening, question esophagitis or reflux. 3. Small bilateral pleural effusions. Cardiomegaly. 4. Moderate superior endplate compression deformity at L1, of uncertain age, new since 2019. Aortic Atherosclerosis (ICD10-I70.0). Electronically Signed   By: Donavan Foil M.D.   On: 10/29/2020 18:57   DG CHEST PORT 1 VIEW  Result Date: 11/02/2020 CLINICAL DATA:  Chest pain when swallowing water EXAM: PORTABLE CHEST 1 VIEW COMPARISON:  10/29/2020 FINDINGS: Cardiomegaly and aortic tortuosity. Stable lung markings. There is no edema, consolidation, effusion, or pneumothorax. IMPRESSION: 1. No acute finding when compared to prior. 2. Cardiomegaly. Electronically Signed   By: Monte Fantasia M.D.   On: 11/02/2020 04:36   DG Chest Port 1 View  Result Date: 10/29/2020 CLINICAL DATA:  Questionable sepsis. EXAM: PORTABLE CHEST 1  VIEW COMPARISON:  August 19, 2020. FINDINGS: Enlarged cardiac silhouette, similar to prior. No  confluent consolidation. Streaky left basilar opacity. No visible pleural effusions or pneumothorax on this semi upright portable radiograph. No acute osseous abnormality. IMPRESSION: Streaky left basilar opacity, favored to reflect atelectasis. No confluent consolidation. Electronically Signed   By: Margaretha Sheffield MD   On: 10/29/2020 15:04     CBC Recent Labs  Lab 11/09/20 1034  WBC 7.3  HGB 11.2*  HCT 36.2  PLT 255  MCV 94.5  MCH 29.2  MCHC 30.9  RDW 13.9    Chemistries  Recent Labs  Lab 11/09/20 1034  NA 141  K 4.2  CL 111  CO2 23  GLUCOSE 238*  BUN 9  CREATININE 1.36*  CALCIUM 8.1*  AST 38  ALT 41  ALKPHOS 83  BILITOT 0.4   ------------------------------------------------------------------------------------------------------------------ No results for input(s): CHOL, HDL, LDLCALC, TRIG, CHOLHDL, LDLDIRECT in the last 72 hours.  Lab Results  Component Value Date   HGBA1C 13.0 (H) 10/29/2020   ------------------------------------------------------------------------------------------------------------------ No results for input(s): TSH, T4TOTAL, T3FREE, THYROIDAB in the last 72 hours.  Invalid input(s): FREET3 ------------------------------------------------------------------------------------------------------------------ No results for input(s): VITAMINB12, FOLATE, FERRITIN, TIBC, IRON, RETICCTPCT in the last 72 hours.  Coagulation profile No results for input(s): INR, PROTIME in the last 168 hours.  No results for input(s): DDIMER in the last 72 hours.  Cardiac Enzymes No results for input(s): CKMB, TROPONINI, MYOGLOBIN in the last 168 hours.  Invalid input(s): CK ------------------------------------------------------------------------------------------------------------------    Component Value Date/Time   BNP 835.0 (H) 06/22/2017 2122     Roxan Hockey M.D on 11/10/2020 at 8:51 AM  Go to [email or web address] - for contact info  [facility]  - Office  [phone]

## 2020-11-11 ENCOUNTER — Encounter (HOSPITAL_COMMUNITY): Payer: Self-pay | Admitting: Nephrology

## 2020-11-11 DIAGNOSIS — K922 Gastrointestinal hemorrhage, unspecified: Secondary | ICD-10-CM

## 2020-11-11 DIAGNOSIS — A0472 Enterocolitis due to Clostridium difficile, not specified as recurrent: Secondary | ICD-10-CM

## 2020-11-11 DIAGNOSIS — I480 Paroxysmal atrial fibrillation: Secondary | ICD-10-CM

## 2020-11-11 DIAGNOSIS — Z01810 Encounter for preprocedural cardiovascular examination: Secondary | ICD-10-CM

## 2020-11-11 LAB — CBC
HCT: 34.3 % — ABNORMAL LOW (ref 36.0–46.0)
HCT: 32.9 % — ABNORMAL LOW (ref 36.0–46.0)
HCT: 33.4 % — ABNORMAL LOW (ref 36.0–46.0)
Hemoglobin: 10.6 g/dL — ABNORMAL LOW (ref 12.0–15.0)
Hemoglobin: 9.9 g/dL — ABNORMAL LOW (ref 12.0–15.0)
Hemoglobin: 10.4 g/dL — ABNORMAL LOW (ref 12.0–15.0)
MCH: 28.9 pg (ref 26.0–34.0)
MCH: 28.2 pg (ref 26.0–34.0)
MCH: 29 pg (ref 26.0–34.0)
MCHC: 30.9 g/dL (ref 30.0–36.0)
MCHC: 30.1 g/dL (ref 30.0–36.0)
MCHC: 31.1 g/dL (ref 30.0–36.0)
MCV: 93.5 fL (ref 80.0–100.0)
MCV: 93.7 fL (ref 80.0–100.0)
MCV: 93 fL (ref 80.0–100.0)
Platelets: 238 10*3/uL (ref 150–400)
Platelets: 233 10*3/uL (ref 150–400)
Platelets: 252 10*3/uL (ref 150–400)
RBC: 3.67 MIL/uL — ABNORMAL LOW (ref 3.87–5.11)
RBC: 3.51 MIL/uL — ABNORMAL LOW (ref 3.87–5.11)
RBC: 3.59 MIL/uL — ABNORMAL LOW (ref 3.87–5.11)
RDW: 13.5 % (ref 11.5–15.5)
RDW: 13.4 % (ref 11.5–15.5)
RDW: 13.6 % (ref 11.5–15.5)
WBC: 6.4 10*3/uL (ref 4.0–10.5)
WBC: 7 10*3/uL (ref 4.0–10.5)
WBC: 6.4 10*3/uL (ref 4.0–10.5)
nRBC: 0 % (ref 0.0–0.2)
nRBC: 0 % (ref 0.0–0.2)
nRBC: 0 % (ref 0.0–0.2)

## 2020-11-11 LAB — OCCULT BLOOD X 1 CARD TO LAB, STOOL: Fecal Occult Bld: POSITIVE — AB

## 2020-11-11 LAB — GLUCOSE, CAPILLARY: Glucose-Capillary: 170 mg/dL — ABNORMAL HIGH (ref 70–99)

## 2020-11-11 NOTE — Consult Note (Signed)
Cardiology Consultation:   Patient ID: [name]; [id number]; 16-May-1966   Admit date: 10/29/2020 Date of Consult: 11/11/2020  Primary Care Provider: [name], MD Primary Cardiologist: Formerly Kate Sable, MD Primary Electrophysiologist: None   Patient Profile:   [name] is a 55 y.o. female with a history of PAF/PAT with CHA2DS2-VASc score of 6, prior nonischemic cardiomyopathy with improvement in LVEF, stroke, hypertension, hyperlipidemia, type 2 diabetes mellitus, GI bleed, polysubstance abuse, and noncompliance who is being seen today for preprocedure cardiac assessment prior to planned endoscopy at the request of Dr. Charna Elizabeth.  History of Present Illness:   Ms. Coard is currently hospitalized having presented with DKA and altered mental status, rapid atrial fibrillation that required cardioversion in the ER, C. difficile diarrhea, and generalized debilitation.  Within the last 24 hours she has had evidence of upper GI bleed and is now being considered for EGD by gastroenterology.  From a cardiac perspective, she has not maintained regular follow-up in our clinic, last saw got Dr. Bronson Ing in February 2021.  She has a history of paroxysmal atrial fibrillation with CHA2DS2-VASc score of 6.  During hospital stay she has been continued on Eliquis along with Plavix (which was being used with prior history of stroke).  She is in sinus rhythm at this time.  She was previously on amiodarone for rhythm suppression, more recently on Toprol-XL.  She had an echocardiogram done in October 2021 that reported low normal LVEF of 50 to 55% with moderate LVH, normal RV contraction and estimated RVSP, mild to moderate left atrial enlargement, and mild mitral regurgitation.  Past Medical History:  Diagnosis Date  . Alcohol use   . Ankle fracture, lateral malleolus, closed 2013  . Anxiety   . Breast mass, left 2013  . Chronic anemia   . CKD (chronic kidney disease),  stage III ([place])   . Cocaine abuse ([place])   . COPD (chronic obstructive pulmonary disease) ([place])   . Diabetes mellitus, type 2 ([place])   . Diabetic Charcot foot ([place])   . Essential hypertension   . History of cardiomyopathy   . History of GI bleed 2011  . Hyperlipidemia   . Noncompliance   . Obesity   . Panic attacks   . PAT (paroxysmal atrial tachycardia) ([facility])    Previously on Amiodarone  . Seizures ([place]) 10/01/2020  . Sleep apnea    Not on CPAP  . Stroke ([place]) 2018  . Tobacco abuse   . Urinary incontinence     Past Surgical History:  Procedure Laterality Date  . BREAST BIOPSY    . CARDIAC CATHETERIZATION N/A 07/28/2016   Procedure: Left Heart Cath and Coronary Angiography;  Surgeon: Jettie Booze, MD;  Location: [facility];  Service: Cardiovascular;  Laterality: N/A;  . COLONOSCOPY N/A 05/10/2019   Procedure: COLONOSCOPY;  Surgeon: Danie Binder, MD;  [facility];  Service: Endoscopy;  Laterality: N/A;  Phenergan 12.5 mg IV in pre-op  . DILATION AND CURETTAGE OF UTERUS    . I & D EXTREMITY Bilateral 09/22/2017   Procedure: BILATERAL DEBRIDEMENT LEG/FOOT ULCERS, APPLY VERAFLO WOUND VAC;  Surgeon: Newt Minion, MD;  Location: [place];  Service: Orthopedics;  Laterality: Bilateral;  . I & D EXTREMITY Right 10/11/2018   Procedure: IRRIGATION AND DEBRIDEMENT RIGHT HAND;  Surgeon: [name], MD;  Location: [place];  Service: Orthopedics;  Laterality: Right;  . I & D EXTREMITY Right 10/13/2018   Procedure: REPEAT IRRIGATION  AND DEBRIDEMENT RIGHT HAND;  Surgeon: [name], MD;  Location: [place];  Service: Orthopedics;  Laterality: Right;  . I & D EXTREMITY Right 11/22/2018   Procedure: IRRIGATION AND DEBRIDEMENT AND PINNING RIGHT HAND;  Surgeon: [name], MD;  Location: [place];  Service: Orthopedics;  Laterality: Right;  . IR RADIOLOGIST EVAL & MGMT  07/05/2018  . POLYPECTOMY  05/10/2019   Procedure: POLYPECTOMY;  Surgeon: Danie Binder, MD;   [facility];  Service: Endoscopy;;  . SKIN SPLIT GRAFT Bilateral 09/28/2017   Procedure: BILATERAL SPLIT THICKNESS SKIN GRAFT LEGS/FEET AND APPLY VAC;  Surgeon: Newt Minion, MD;  Location: [place];  Service: Orthopedics;  Laterality: Bilateral;  . SKIN SPLIT GRAFT Right 11/22/2018   Procedure: SKIN GRAFT SPLIT THICKNESS;  Surgeon: [name], MD;  Location: [place];  Service: Orthopedics;  Laterality: Right;     Inpatient Medications: Scheduled Meds: . amLODipine  10 mg Oral Daily  . DULoxetine  60 mg Oral Daily  . gabapentin  300 mg Oral TID  . guaiFENesin  1,200 mg Oral BID  . insulin aspart  0-15 Units Subcutaneous TID WC  . insulin aspart  0-5 Units Subcutaneous QHS  . insulin aspart  3 Units Subcutaneous TID WC  . insulin glargine  9 Units Subcutaneous Q24H  . levETIRAcetam  750 mg Oral BID  . metoprolol succinate  50 mg Oral BID  . montelukast  10 mg Oral QHS  . pantoprazole (PROTONIX) IV  40 mg Intravenous Q12H  . rosuvastatin  10 mg Oral Daily  . saccharomyces boulardii  250 mg Oral BID    PRN Meds: acetaminophen, albuterol, alum & mag hydroxide-simeth, hydrALAZINE, ondansetron (ZOFRAN) IV, umeclidinium bromide  Allergies:   No Known Allergies  Social History:   Social History   Tobacco Use  . Smoking status: Former Smoker    Packs/day: 1.00    Years: 20.00    Pack years: 20.00    Types: Cigarettes    Quit date: 06/03/2017    Years since quitting: 3.4  . Smokeless tobacco: Never Used  Substance Use Topics  . Alcohol use: Not Currently    Alcohol/week: 0.0 standard drinks    Comment: Quit in 2017    Family History:   The patient's family history includes Arthritis in an other family member; Asthma in an other family member; Cancer in her sister and another family member; Diabetes in an other family member; Heart attack in her mother; Hypertension in her brother, daughter, father, mother, sister, sister, and son.  ROS:  Please see the history of  present illness.  Reports reflux symptoms, some degree of dysphagia.  No recent palpitations or chest pain.  All other ROS reviewed and negative.     Physical Exam/Data:   Vitals:   11/10/20 1405 11/10/20 1653 11/10/20 2208 11/11/20 0444  BP: 121/77 124/79 (!) 153/84 (!) 135/94  Pulse: 83 90 82 85  Resp:   18 18  Temp: 99 F (37.2 C) 99 F (37.2 C) 98.7 F (37.1 C) 98.7 F (37.1 C)  TempSrc: Oral Oral Oral Oral  SpO2: 98%  94% 98%  Weight:      Height:        Intake/Output Summary (Last 24 hours) at 11/11/2020 1432 Last data filed at 11/11/2020 0648 Gross per 24 hour  Intake 720 ml  Output 1500 ml  Net -780 ml   Filed Weights   10/29/20 1243  Weight: 78.9 kg   Body mass  index is 32.88 kg/m.   Gen: Patient appears comfortable at rest. HEENT: Conjunctiva and lids normal, oropharynx clear. Neck: Supple, no elevated JVP or carotid bruits, no thyromegaly. Lungs: Clear to auscultation, nonlabored breathing at rest. Cardiac: Regular rate and rhythm, no S3 or significant systolic murmur, no pericardial rub. Abdomen: Soft, nontender, bowel sounds present. Extremities: No pitting edema, distal pulses 2+. Skin: Warm and dry. Musculoskeletal: No kyphosis. Neuropsychiatric: Alert and oriented x3, affect grossly appropriate, somewhat animated.  EKG:  An ECG dated 10/29/2020 was personally reviewed today and demonstrated:  Atrial fibrillation with RVR, IVCD.  Relevant CV Studies:  Echocardiogram 08/14/2020: 1. Left ventricular ejection fraction, by estimation, is 50 to 55%. The  left ventricle has low normal function. The left ventricle has no regional  wall motion abnormalities. There is moderate left ventricular hypertrophy.  Left ventricular diastolic  parameters are indeterminate.  2. Right ventricular systolic function is normal. The right ventricular  size is normal. There is normal pulmonary artery systolic pressure. The  estimated right ventricular systolic pressure  is 67.3 mmHg.  3. Left atrial size was mild to moderately dilated.  4. The mitral valve is grossly normal. Mild mitral valve regurgitation.  5. The aortic valve is tricuspid. Aortic valve regurgitation is not  visualized.  6. The inferior vena cava is normal in size with greater than 50%  respiratory variability, suggesting right atrial pressure of 3 mmHg.   Laboratory Data:  Chemistry Recent Labs  Lab 11/09/20 1034  NA 141  K 4.2  CL 111  CO2 23  GLUCOSE 238*  BUN 9  CREATININE 1.36*  CALCIUM 8.1*  GFRNONAA 46*  ANIONGAP 7    Recent Labs  Lab 11/09/20 1034  PROT 5.4*  ALBUMIN 2.0*  AST 38  ALT 41  ALKPHOS 83  BILITOT 0.4   Hematology Recent Labs  Lab 11/09/20 1034 11/11/20 0615 11/11/20 1133  WBC 7.3 6.4 7.0  RBC 3.83* 3.67* 3.51*  HGB 11.2* 10.6* 9.9*  HCT 36.2 34.3* 32.9*  MCV 94.5 93.5 93.7  MCH 29.2 28.9 28.2  MCHC 30.9 30.9 30.1  RDW 13.9 13.5 13.4  PLT 255 238 233    Radiology/Studies:   Chest x-ray 11/02/2020: FINDINGS: Cardiomegaly and aortic tortuosity. Stable lung markings. There is no edema, consolidation, effusion, or pneumothorax.  IMPRESSION: 1. No acute finding when compared to prior. 2. Cardiomegaly.  Assessment and Plan:   1.  Preprocedure cardiac assessment prior to planned endoscopy with sedation.  Patient is 51 with a history of PAF/PAT, currently in sinus rhythm following cardioversion in the ER at presentation with RVR.  CHA2DS2-VASc score is 6.  Compliance with medications as an outpatient is uncertain at best.  She has been on Eliquis (along with Plavix with prior history of stroke) during hospital stay and developed significant hematemesis yesterday with drop in hemoglobin.  She is currently hemodynamically stable.  Also has history of nonischemic cardiomyopathy with improvement in LVEF, most recently 50 to 55% as of October 2021.  No active heart failure symptoms.  Eliquis and Plavix are currently held and she continues on  Toprol-XL.  2. Presentation with DKA, currently resolved and on Lantus with NovoLog sliding scale.  3. C. difficile diarrhea, significantly improved.  Finished oral vancomycin treatment on January 15  4. History of seizures, on Keppra without obvious breakthrough.  No clear cardiac contraindication to proceed with EGD as planned for further evaluation of hematemesis.  Agree with holding both Plavix and Eliquis.  Depending on findings  at EGD, would anticipate resuming Eliquis without Plavix ultimately.  Continue Toprol-XL, follow rhythm on telemetry in the periprocedural period.  Signed, Rozann Lesches, MD  11/11/2020 2:32 PM

## 2020-11-11 NOTE — Progress Notes (Signed)
Patient has a large bloody stool. MD has been informed. Patient has consult to GI and CBC has been ordered. Patient complains of pain 4/10 to mid abdomen using pain scale 0-10.   Vitals are as follows     11/11/20 1826  Vitals  Temp 98.6 F (37 C)  Temp Source Oral  BP 122/82  BP Location Left Arm  BP Method Automatic  Patient Position (if appropriate) Lying  Pulse Rate 85  Pulse Rate Source Dinamap  Resp 18  Level of Consciousness  Level of Consciousness Alert  MEWS COLOR  MEWS Score Color Green  Oxygen Therapy  SpO2 95 %  O2 Device Room Air  Pain Assessment  Pain Scale 0-10  Pain Score 4  Pain Type Acute pain  Pain Location Abdomen  Pain Orientation Mid  Pain Descriptors / Indicators Aching;Discomfort  Pain Frequency Constant  Pain Onset On-going  Patients Stated Pain Goal 0  Pain Intervention(s) Environmental changes;Emotional support;Rest;Relaxation  Multiple Pain Sites No  POSS Scale ([place] Opioid Sedation Scale)  POSS *See Group Information* 1-Acceptable,Awake and alert  PCA/Epidural/Spinal Assessment  Respiratory Pattern Regular;Unlabored;Symmetrical  MEWS Score  MEWS Temp 0  MEWS Systolic 0  MEWS Pulse 0  MEWS RR 0  MEWS LOC 0  MEWS Score 0

## 2020-11-11 NOTE — Consult Note (Signed)
[name], M.D. Gastroenterology & Hepatology                                           Patient Name: [name] Account #: '@[id number]' @   MRN: [id number] Admission Date: 10/29/2020 Date of Evaluation:  11/11/2020 Time of Evaluation: 12:22 PM   Referring Physician: Roxan Hockey, MD  Chief Complaint: hematemesis  HPI:  This is a 55 y.o. female past medical history of alcohol use, CKD, cocaine abuse, COPD, diabetes, hypertension, hyperlipidemia, obesity, atrial fibrillation, seizures, stroke,Who was initially admitted to the hospital after presenting altered mental status, vomiting and diarrhea.    In the ER, the patient was found to have hypotension and wide-complex tachycardia which required cardioversion x2.  Patient was also found to have DKA, for which she was put on insulin drip and has been bridged.  Gastroenterology was consulted as the patient presented persistent epigastric abdominal pain.  She has also presented recurrent episodes of diarrhea, multiple episodes per day without melena or hematochezia.  During this hospitalization the patient was found to have C. difficile, for which she received vancomycin until 11/09/2020.  However, she has presented persistent nausea and she had persistent vomiting, which was concerning since yesterday that she presented at least 4 episodes of streaks of blood in her vomit (described as a small bowl of blood).  She has not presented any melena or hematochezia.  Last episode of vomiting was 4 hours ago.  Notably, the patient states that she used to take Advil PM every night to go to sleep.  She is also taking Eliquis, her last dose was given yesterday morning.  Denies taking any other antiplatelet medication.  Patient has never had an EGD in the past.  Reports that her last colonoscopy was performed in 2020, she was found to have a 4 mm polyp in the ileocecal valve which was removed, also had tortuous colon and hemorrhoids.  Since her  episodes have hematemesis started, the patient was noted to have a drop in her hemoglobin from a baseline of mid 10s to most recent value of 9.9.  FHx: neg for any gastrointestinal/liver disease, no malignancies Social: neg smoking, used to consume cocaine but quit "many decades ago"   Past Medical History: SEE CHRONIC ISSSUES: Past Medical History:  Diagnosis Date  . Alcohol use   . Ankle fracture, lateral malleolus, closed 12/30/2011  . Anxiety   . Breast mass, left 12/15/2011  . Chronic anemia   . Chronic combined systolic and diastolic CHF (congestive heart failure) ([place])   . CKD (chronic kidney disease), stage III ([place])    pt denies  . Cocaine abuse ([name])   . COPD (chronic obstructive pulmonary disease) ([place])   . Diabetes mellitus, type 2 ([place])   . Diabetic Charcot foot ([place]) 12/30/2011  . Essential hypertension   . History of GI bleed 12/05/2009   Qualifier: Diagnosis of  By: Zeb Comfort    . Hyperlipidemia   . Hypertensive cardiomyopathy ([place]) 11/08/2012   a. 05/2016: echo showing EF of 20-25% with normal cors by cath in 07/2016.  . Morbid obesity ([place]) 01/31/2008   Qualifier: Diagnosis of  By: Lenn Cal    . Noncompliance with medication regimen   . Obesity   . Panic attacks   . PAT (paroxysmal atrial tachycardia) ([place])    a. started on amiodarone 07/2017 for this.  Marland Kitchen  Seizures ([place]) 10/01/2020  . Sleep apnea    does not use cpap  . Stroke ([place])    06/24/17  . Tobacco abuse   . Urinary incontinence    Past Surgical History:  Past Surgical History:  Procedure Laterality Date  . BREAST BIOPSY    . CARDIAC CATHETERIZATION N/A 07/28/2016   Procedure: Left Heart Cath and Coronary Angiography;  Surgeon: Jettie Booze, MD;  Location: [facility];  Service: Cardiovascular;  Laterality: N/A;  . COLONOSCOPY N/A 05/10/2019   Procedure: COLONOSCOPY;  Surgeon: Danie Binder, MD;  [facility];  Service: Endoscopy;  Laterality: N/A;  Phenergan 12.5 mg IV  in pre-op  . DILATION AND CURETTAGE OF UTERUS    . I & D EXTREMITY Bilateral 09/22/2017   Procedure: BILATERAL DEBRIDEMENT LEG/FOOT ULCERS, APPLY VERAFLO WOUND VAC;  Surgeon: Newt Minion, MD;  Location: [facility];  Service: Orthopedics;  Laterality: Bilateral;  . I & D EXTREMITY Right 10/11/2018   Procedure: IRRIGATION AND DEBRIDEMENT RIGHT HAND;  Surgeon: [name], MD;  Location: [facility];  Service: Orthopedics;  Laterality: Right;  . I & D EXTREMITY Right 10/13/2018   Procedure: REPEAT IRRIGATION AND DEBRIDEMENT RIGHT HAND;  Surgeon: [name], MD;  Location: [facility];  Service: Orthopedics;  Laterality: Right;  . I & D EXTREMITY Right 11/22/2018   Procedure: IRRIGATION AND DEBRIDEMENT AND PINNING RIGHT HAND;  Surgeon: [name], MD;  Location: [facility];  Service: Orthopedics;  Laterality: Right;  . IR RADIOLOGIST EVAL & MGMT  07/05/2018  . POLYPECTOMY  05/10/2019   Procedure: POLYPECTOMY;  Surgeon: Danie Binder, MD;  [facility];  Service: Endoscopy;;  . SKIN SPLIT GRAFT Bilateral 09/28/2017   Procedure: BILATERAL SPLIT THICKNESS SKIN GRAFT LEGS/FEET AND APPLY VAC;  Surgeon: Newt Minion, MD;  Location: [facility];  Service: Orthopedics;  Laterality: Bilateral;  . SKIN SPLIT GRAFT Right 11/22/2018   Procedure: SKIN GRAFT SPLIT THICKNESS;  Surgeon: [name], MD;  Location: [facility];  Service: Orthopedics;  Laterality: Right;   Family History:  Family History  Problem Relation Age of Onset  . Hypertension Mother   . Heart attack Mother   . Hypertension Father        CABG   . Hypertension Sister   . Hypertension Brother   . Hypertension Sister   . Cancer Sister        breast   . Arthritis Other   . Cancer Other   . Diabetes Other   . Asthma Other   . Hypertension Daughter   . Hypertension Son    Social History:  Social History   Tobacco Use  . Smoking status: Former Smoker    Packs/day: 1.00    Years: 20.00    Pack years: 20.00    Types: Cigarettes     Quit date: 06/03/2017    Years since quitting: 3.4  . Smokeless tobacco: Never Used  Vaping Use  . Vaping Use: Never used  Substance Use Topics  . Alcohol use: Not Currently    Alcohol/week: 0.0 standard drinks    Comment: Quit in 2017  . Drug use: Not Currently    Frequency: 3.0 times per week    Types: Marijuana, Cocaine    Comment: none since August 2018    Home Medications:  Prior to Admission medications   Medication Sig Start Date End Date Taking? Authorizing Provider  albuterol (PROAIR HFA) 108 (90 Base) MCG/ACT inhaler INHALE 2  PUFFS EVERY 6 HOURS AS NEEDED FOR SHORTNESS OF BREATH/WHEEZING. Patient taking differently: Inhale 2 puffs into the lungs every 6 (six) hours as needed for wheezing or shortness of breath. 08/05/20  Yes [name], MD  amLODipine (NORVASC) 10 MG tablet TAKE 1 TABLET BY MOUTH ONCE A DAY. Patient taking differently: Take 10 mg by mouth daily. 07/19/20  Yes [name], MD  apixaban (ELIQUIS) 5 MG TABS tablet Take 5 mg by mouth 2 (two) times daily.   Yes [provider]  clopidogrel (PLAVIX) 75 MG tablet TAKE 1 TABLET BY MOUTH DAILY WITH BREAKFAST. Patient taking differently: Take 75 mg by mouth daily. With breakfast 07/19/20  Yes [name], MD  DULoxetine (CYMBALTA) 30 MG capsule TAKE 2 CAPSULES BY MOUTH ONCE DAILY. Patient taking differently: Take 60 mg by mouth daily. 07/19/20  Yes [name], MD  furosemide (LASIX) 20 MG tablet Take 1 tablet (20 mg total) by mouth daily. For fluid 11/07/20 11/07/21 Yes Emokpae, Courage, MD  gabapentin (NEURONTIN) 300 MG capsule TAKE 1 CAPSULE BY MOUTH THREE TIMES A DAY. 11/14/19  Yes Newt Minion, MD  insulin aspart (NOVOLOG) 100 UNIT/ML FlexPen Inject 0-10 Units into the skin 3 (three) times daily with meals. insulin aspart (novoLOG) injection 0-10 Units 0-10 Units Subcutaneous, 3 times daily with meals CBG < 70: Implement Hypoglycemia Standing Orders and refer to Hypoglycemia  Standing Orders sidebar report  CBG 70 - 120: 0 unit CBG 121 - 150: 0 unit  CBG 151 - 200: 1 unit CBG 201 - 250: 2 units CBG 251 - 300: 4 units CBG 301 - 350: 6 units  CBG 351 - 400: 8 units  CBG > 400: 10 units 11/07/20  Yes Emokpae, Courage, MD  levETIRAcetam (KEPPRA) 750 MG tablet Take 1 tablet (750 mg total) by mouth 2 (two) times daily. 08/28/20  Yes Aline August, MD  metoprolol succinate (TOPROL-XL) 50 MG 24 hr tablet TAKE 1 TABLET BY MOUTH TWICE A DAY. Patient taking differently: Take 50 mg by mouth in the morning and at bedtime. 08/13/20  Yes [name], MD  montelukast (SINGULAIR) 10 MG tablet TAKE (1) TABLET BY MOUTH AT BEDTIME. Patient taking differently: Take 10 mg by mouth at bedtime. 07/19/20  Yes [name], MD  olmesartan (BENICAR) 40 MG tablet TAKE 1 TABLET BY MOUTH ONCE A DAY. Patient taking differently: Take 40 mg by mouth daily. 08/13/20  Yes [name], MD  oxybutynin (DITROPAN-XL) 5 MG 24 hr tablet Take 5 mg by mouth at bedtime.   Yes [provider]  rosuvastatin (CRESTOR) 10 MG tablet TAKE 1 TABLET BY MOUTH ONCE A DAY. Patient taking differently: Take 10 mg by mouth daily. 07/19/20  Yes [name], MD  sitaGLIPtin (JANUVIA) 100 MG tablet Take 100 mg by mouth daily.   Yes [provider]  tiotropium (SPIRIVA HANDIHALER) 18 MCG inhalation capsule Place 18 mcg into inhaler and inhale daily as needed (Shortness of breath).    Yes [provider]  traZODone (DESYREL) 50 MG tablet TAKE (1) TABLET BY MOUTH AT BEDTIME. Patient taking differently: Take 50 mg by mouth at bedtime. 07/19/20  Yes [name], MD  acetaminophen (TYLENOL) 325 MG tablet Take 2 tablets (650 mg total) by mouth every 6 (six) hours as needed for headache, fever or mild pain. 11/04/20   Johnson, Clanford L, MD  cloNIDine (CATAPRES) 0.2 MG tablet Take 1 tablet (0.2 mg total) by mouth 2 (two)  times daily. 11/04/20   Johnson, Clanford L, MD   DULoxetine (CYMBALTA) 60 MG capsule Take 1 capsule (60 mg total) by mouth daily. 11/08/20   Roxan Hockey, MD  insulin glargine (LANTUS) 100 UNIT/ML injection Inject 0.09 mLs (9 Units total) into the skin daily. 11/07/20   Roxan Hockey, MD  pantoprazole (PROTONIX) 40 MG tablet Take 1 tablet (40 mg total) by mouth daily. 11/04/20   Johnson, Clanford L, MD  saccharomyces boulardii (FLORASTOR) 250 MG capsule Take 1 capsule (250 mg total) by mouth 2 (two) times daily for 14 days. 11/04/20 11/18/20  Murlean Iba, MD  vancomycin (VANCOCIN) 50 mg/mL oral solution Take 2.5 mLs (125 mg total) by mouth 4 (four) times daily for 5 days. 11/07/20 11/12/20  Roxan Hockey, MD    Inpatient Medications:  Current Facility-Administered Medications:  .  acetaminophen (TYLENOL) tablet 650 mg, 650 mg, Oral, Q6H PRN, Wynetta Emery, Clanford L, MD, 650 mg at 11/10/20 1936 .  albuterol (VENTOLIN HFA) 108 (90 Base) MCG/ACT inhaler 2 puff, 2 puff, Inhalation, Q6H PRN, Johnson, Clanford L, MD .  alum & mag hydroxide-simeth (MAALOX/MYLANTA) 174-944-96 MG/5ML suspension 30 mL, 30 mL, Oral, Q6H PRN, Denton Brick, Courage, MD, 30 mL at 11/07/20 2120 .  amLODipine (NORVASC) tablet 10 mg, 10 mg, Oral, Daily, Roney Jaffe, MD, 10 mg at 11/11/20 0912 .  DULoxetine (CYMBALTA) DR capsule 60 mg, 60 mg, Oral, Daily, Johnson, Clanford L, MD, 60 mg at 11/11/20 0911 .  gabapentin (NEURONTIN) capsule 300 mg, 300 mg, Oral, TID, Johnson, Clanford L, MD, 300 mg at 11/11/20 0911 .  guaiFENesin (MUCINEX) 12 hr tablet 1,200 mg, 1,200 mg, Oral, BID, Johnson, Clanford L, MD, 1,200 mg at 11/11/20 0912 .  hydrALAZINE (APRESOLINE) injection 10 mg, 10 mg, Intravenous, Q4H PRN, Johnson, Clanford L, MD, 10 mg at 11/03/20 0330 .  insulin aspart (novoLOG) injection 0-15 Units, 0-15 Units, Subcutaneous, TID WC, Johnson, Clanford L, MD, 3 Units at 11/11/20 0914 .  insulin aspart (novoLOG) injection 0-5 Units, 0-5 Units, Subcutaneous, QHS, Johnson, Clanford  L, MD, 3 Units at 10/31/20 2235 .  insulin aspart (novoLOG) injection 3 Units, 3 Units, Subcutaneous, TID WC, Johnson, Clanford L, MD, 3 Units at 11/10/20 0818 .  insulin glargine (LANTUS) injection 9 Units, 9 Units, Subcutaneous, Q24H, Johnson, Clanford L, MD, 9 Units at 11/10/20 1146 .  levETIRAcetam (KEPPRA) tablet 750 mg, 750 mg, Oral, BID, Johnson, Clanford L, MD, 750 mg at 11/11/20 0911 .  metoprolol succinate (TOPROL-XL) 24 hr tablet 50 mg, 50 mg, Oral, BID, Roney Jaffe, MD, 50 mg at 11/11/20 0912 .  montelukast (SINGULAIR) tablet 10 mg, 10 mg, Oral, QHS, Johnson, Clanford L, MD, 10 mg at 11/10/20 2247 .  ondansetron (ZOFRAN) injection 4 mg, 4 mg, Intravenous, Q6H PRN, Roney Jaffe, MD, 4 mg at 11/11/20 0127 .  pantoprazole (PROTONIX) injection 40 mg, 40 mg, Intravenous, Q12H, Emokpae, Courage, MD, 40 mg at 11/11/20 0913 .  rosuvastatin (CRESTOR) tablet 10 mg, 10 mg, Oral, Daily, Roney Jaffe, MD, 10 mg at 11/11/20 0912 .  saccharomyces boulardii (FLORASTOR) capsule 250 mg, 250 mg, Oral, BID, Johnson, Clanford L, MD, 250 mg at 11/11/20 0911 .  umeclidinium bromide (INCRUSE ELLIPTA) 62.5 MCG/INH 1 puff, 1 puff, Inhalation, Daily PRN, Wynetta Emery, Clanford L, MD Allergies: Patient has no known allergies.  Complete Review of Systems: GENERAL: negative for malaise, night sweats HEENT: No changes in hearing or vision, no nose bleeds or other nasal problems. NECK: Negative for lumps, goiter, pain and significant neck  swelling RESPIRATORY: Negative for cough, wheezing CARDIOVASCULAR: Negative for chest pain, leg swelling, palpitations, orthopnea GI: SEE HPI MUSCULOSKELETAL: Negative for joint pain or swelling, back pain, and muscle pain. SKIN: Negative for lesions, rash PSYCH: Negative for sleep disturbance, mood disorder and recent psychosocial stressors. HEMATOLOGY Negative for prolonged bleeding, bruising easily, and swollen nodes. ENDOCRINE: Negative for cold or heat intolerance,  polyuria, polydipsia and goiter. NEURO: negative for tremor, gait imbalance, syncope and seizures. The remainder of the review of systems is noncontributory.  Physical Exam: BP (!) 135/94 (BP Location: Left Arm)   Pulse 85   Temp 98.7 F (37.1 C) (Oral)   Resp 18   Ht '5\' 1"'  (1.549 m)   Wt 78.9 kg   LMP 05/19/2016   SpO2 98%   BMI 32.88 kg/m  GENERAL: The patient is AO x3, in no acute distress.  Obese HEENT: Head is normocephalic and atraumatic. EOMI are intact. Mouth is well hydrated and without lesions. NECK: Supple. No masses LUNGS: Clear to auscultation. No presence of rhonchi/wheezing/rales. Adequate chest expansion HEART: RRR, normal s1 and s2. ABDOMEN: Soft, nontender, no guarding, no peritoneal signs, and nondistended. BS +. No masses. RECTAL EXAM: Deferred EXTREMITIES: Without any cyanosis, clubbing, rash, lesions or edema. NEUROLOGIC: AOx3, no focal motor deficit. SKIN: no jaundice, no rashes   Laboratory Data CBC:     Component Value Date/Time   WBC 7.0 11/11/2020 1133   RBC 3.51 (L) 11/11/2020 1133   HGB 9.9 (L) 11/11/2020 1133   HCT 32.9 (L) 11/11/2020 1133   PLT 233 11/11/2020 1133   MCV 93.7 11/11/2020 1133   MCH 28.2 11/11/2020 1133   MCHC 30.1 11/11/2020 1133   RDW 13.4 11/11/2020 1133   LYMPHSABS 0.8 10/29/2020 1408   MONOABS 0.5 10/29/2020 1408   EOSABS 0.0 10/29/2020 1408   BASOSABS 0.1 10/29/2020 1408   COAG:  Lab Results  Component Value Date   INR 1.1 10/29/2020   INR 1.0 08/13/2020   INR 1.61 07/11/2018    BMP:  BMP Latest Ref Rng & Units 11/09/2020 11/01/2020 10/31/2020  Glucose 70 - 99 mg/dL 238(H) 247(H) 198(H)  BUN 6 - 20 mg/dL 9 29(H) 38(H)  Creatinine 0.44 - 1.00 mg/dL 1.36(H) 1.42(H) 1.65(H)  BUN/Creat Ratio 6 - 22 (calc) - - -  Sodium 135 - 145 mmol/L 141 141 142  Potassium 3.5 - 5.1 mmol/L 4.2 4.3 3.9  Chloride 98 - 111 mmol/L 111 115(H) 113(H)  CO2 22 - 32 mmol/L 23 19(L) 20(L)  Calcium 8.9 - 10.3 mg/dL 8.1(L) 7.9(L) 7.8(L)     HEPATIC:  Hepatic Function Latest Ref Rng & Units 11/09/2020 10/29/2020 08/20/2020  Total Protein 6.5 - 8.1 g/dL 5.4(L) 7.5 5.2(L)  Albumin 3.5 - 5.0 g/dL 2.0(L) 3.2(L) 1.8(L)  AST 15 - 41 U/L 38 7(L) 8(L)  ALT 0 - 44 U/L 41 18 7  Alk Phosphatase 38 - 126 U/L 83 151(H) 56  Total Bilirubin 0.3 - 1.2 mg/dL 0.4 1.3(H) 0.5  Bilirubin, Direct 0.0 - 0.2 mg/dL - - -    CARDIAC:  Lab Results  Component Value Date   CKTOTAL 40 06/23/2017   CKMB 5.2 (H) 06/23/2017   TROPONINI <0.03 06/04/2018     Imaging: I personally reviewed and interpreted the available imaging.  Assessment & Plan: This is a 55 y.o. female past medical history of alcohol use, CKD, cocaine abuse, COPD, diabetes, hypertension, hyperlipidemia, obesity, atrial fibrillation, seizures, stroke,Who was initially admitted to the hospital after presenting DKA and concomitant  C. difficile infection.  This to medical issues have been addressed but now she is presenting hematemesis for which gastroenterology was consulted.  She had a mild increase in her baseline hemoglobin during this admission but has presented recurrent episodes of hematemesis.  Notably, she has been taking NSAIDs chronically at least once a day, which increases her risk for peptic ulcer disease..  Patient has remained hemodynamically stable throughout her hospital stay.  We will need to proceed with an EGD tomorrow once she has been off her Eliquis for 48 hours.  We will also request cardiology clearance before proceeding with endoscopic evaluation as she presented wide-complex tachycardia required cardioversion recently.  For now, we will continue PPI IV twice daily. Can continue Florastor for post infectious diarrhea, unlikely related to treatment failure to c. Diff although if persistent may need to consider treatment failure.  # Upper gastrointestinal bleeding # C. Diff infection - Repeat CBC qday, transfuse if Hb <8 - Pantoprazole 40 mg q12h IVP - 2 large bore IV  lines - Active T/S - CLD, keep NPO after MN - Obtain cardiology clearance for endoscopic procedure - Avoid NSAIDs and hold Eliquis - Will proceed with EGD tomorrow if cleared   Harvel Quale, MD Gastroenterology and Hepatology [facility]

## 2020-11-11 NOTE — TOC Progression Note (Signed)
Transition of Care [facility]) - Progression Note    Patient Details  Name: [name] MRN: [id number] Date of Birth: [birth year]  [facility] CM/SW Contact  Shade Flood, LCSW Phone Number: 11/11/2020, 8:40 AM  Clinical Narrative:     TOC following. Spoke with [name] at [facility] to inquire on possible bed availability. Per [name], they do not have a female quarantine bed available today. She is hopeful for tomorrow. Spoke with Marianna Fuss at [facility] and they are not able to accept patient. Pt has no other bed offers at this time.  TOC will follow up tomorrow.  Expected Discharge Plan: [place] Barriers to Discharge: Continued Medical Work up  Expected Discharge Plan and Services Expected Discharge Plan: [place] In-house Referral: Clinical Social Work   Post Acute Care Choice: [place] Living arrangements for the past 2 months: Single Family Home Expected Discharge Date: 11/08/20                                     Social Determinants of Health (SDOH) Interventions    Readmission Risk Interventions Readmission Risk Prevention Plan 10/31/2020 08/27/2020  Transportation Screening Complete Complete  Medication Review Press photographer) Complete Complete  PCP or Specialist appointment within 3-5 days of discharge - Complete  HRI or [place] - Complete  SW Recovery Care/Counseling Consult Complete Complete  Palliative Care Screening Not Applicable Not Applicable  Skilled Nursing Facility Complete Complete  Some recent data might be hidden

## 2020-11-11 NOTE — Progress Notes (Signed)
PT Cancellation Note  Patient Details Name: [name] MRN: [id number] DOB: 1966-04-02   Cancelled Treatment:    Reason Eval/Treat Not Completed: Other (comment). Physical therapy held secondary to patient having episode of diarrhea.     2:43 PM, 11/11/20 Lonell Grandchild, MPT Physical Therapist with [facility] [phone] office [phone] mobile phone

## 2020-11-11 NOTE — Progress Notes (Signed)
Patient Demographics:    [name], is a 55 y.o. female, DOB - 1966-02-29, JZP:[id number]  Admit date - 10/29/2020   Admitting Physician Roney Jaffe, MD  Outpatient Primary MD for the patient is [name], MD  LOS - 12   Chief Complaint  Patient presents with  . Hypotension        Subjective:    [name] today has no fevers, no emesis,  No chest pain,    -11/11/20-- -patient had repeated episodes of hematemesis late on 11/10/2020 1 overnight -Complains of epigastric discomfort -Plavix and Eliquis has been on hold since 11/10/2020  Assessment  & Plan :    Principal Problem:   DKA (diabetic ketoacidosis) ([place]) Active Problems:   Essential hypertension   Chronic pain syndrome   Chronic combined systolic and diastolic CHF (congestive heart failure) (HCC)   COPD (chronic obstructive pulmonary disease) (HCC)   AKI (acute kidney injury) ([place])   Atrial fibrillation with RVR (HCC)   Seizures ([place])   Shock ([place])   Brief Admission History:  55 y.o.year-old w/ hx of CVA, seizures, obesity, tobacco abuse, CM EF 25%, HL, hx GIB, HTN, diabetes on insulin, Charcot foot, COPD, cocaine abuse, CKD 3, chronic syst/ diast CHF, anemia, etoh use presented to ED by EMS w/ c/o laying in bed for 2 days w/o medications. She had reportedly been vomiting and having diarrhea and has not been able to keep down her medication. EMS reported pt's boyfriend left 2 days ago and did not return, we has helping to take care of her and give her her medications. EMS BP 80/60, temp 94.5, HR 160 and CBG 414. In ED RR 31, temp 94.8, HR 145 - 162, RA 97% sat, BP 70/30 >>150/70 after cardioversion. BS >600, Na 133 CO2 17 AG 25 Glu 874 BUN 61 Cr 2.47 Ca 8.8 Alb 3.2 LFT's okay, egfr 22 WBC 17K Hb 16.6 plt 327 Lactic 2.3 >1.9 Beta-hydroxy ^ 7.16, tsh 0.5 CXR showed LLL atelectasis, noconsolidation.EKG  showed wide-complex tachycardia at 181 bpm afib w/ RVR, QRS 125 msec. Repeat ekg after cardioversion x 2 (120J, 150J) showed sinus tach at 104 bpm. In ED she was cardioverted as above and started on IV insulin drip and received IV vanc 1500 mg x 1, IV zosyn also ordered. Got 4 L total bolus IVF"s. Blood and urine cx's sent. Asked to see for admission.   Pt seen in room, lives alone, uses a walker or wheelchair mostly, needs help caring for herself. Denies any recent CP or abd pain, +n/v and diarrhea, no fevers or chills. Vague historian.   Assessment & Plan:   Principal Problem:   DKA (diabetic ketoacidosis) ([place]) Active Problems:   Essential hypertension   Chronic pain syndrome   Chronic combined systolic and diastolic CHF (congestive heart failure) (HCC)   COPD (chronic obstructive pulmonary disease) (HCC)   AKI (acute kidney injury) (HCC)   Atrial fibrillation with RVR (HCC)   Seizures (HCC)   Shock ([place])  A/p 1)Atrial fibrillation with RVR - pt s/p 2 cardioversions performed in ED by ED staff.-Remains in sinus rhythm, continue metoprolol for rate control  -- Hold apixaban due to concerns for upper GI bleed -Cardiology consult appreciated  2)C diff infection  with diarrhea --overall much improved,  Completed oral Vanco on 11/09/20  3)DKA--on admission, resolved, off IV insulin, continue subcu scheduled Lantus insulin along with NovoLog sliding scale  4)generalized weakness and debilitation -PT recommended SNF placement.,Unsteady gait, high fall risk, pt agres to  SNF Rehabtransfer  5)Chronic combined sys/diastolic heart failure - stable,  Hold Lasix while n.p.o. for EGD  =-Echo with EF of 50 to 55% -Continue Toprol-XL  6)Epilepsy -stable, continue Keppra  7)COPD - stable,no acute exacerbation at this time, no need for steroids,continue bronchodilators  8)Depression -continue Cymbalta  9) hematemesis with concern for upper GI bleed--- discussed with  Dr. Cherlynn June from GI service plans for EGD on 11/12/2020 -Cardiology consult for preop clearance for EGD with sedation noted -IV Protonix as ordered -Clear liquid diet today and n.p.o. after midnight -Hemoglobin down to 10.6 -Last dose of Plavix and Eliquis was 11/10/2020  DVT prophylaxis: apixaban -on hold okay to use SCDs Code Status: Full  Family Communication: none present  Disposition:  SNF placement  (awaiting isolation bed at SNF) -Disposition/Need for in-Hospital Stay- patient unable to be discharged at this time due to awaiting SNF bed transfer   Status is: Inpatient  Remains inpatient appropriate because:Please see above  Disposition: The patient is from: Home              Anticipated d/c is to: SNF              Anticipated d/c date is: 2 days  Consultants:   GI and cardiology  Procedures:  EGD planned for 1/18/20220  Antimicrobials:  Vancomycin 1/6>>    Lab Results  Component Value Date   PLT 233 11/11/2020    Inpatient Medications  Scheduled Meds: . amLODipine  10 mg Oral Daily  . DULoxetine  60 mg Oral Daily  . gabapentin  300 mg Oral TID  . guaiFENesin  1,200 mg Oral BID  . insulin aspart  0-15 Units Subcutaneous TID WC  . insulin aspart  0-5 Units Subcutaneous QHS  . insulin aspart  3 Units Subcutaneous TID WC  . insulin glargine  9 Units Subcutaneous Q24H  . levETIRAcetam  750 mg Oral BID  . metoprolol succinate  50 mg Oral BID  . montelukast  10 mg Oral QHS  . pantoprazole (PROTONIX) IV  40 mg Intravenous Q12H  . rosuvastatin  10 mg Oral Daily  . saccharomyces boulardii  250 mg Oral BID   Continuous Infusions: PRN Meds:.acetaminophen, albuterol, alum & mag hydroxide-simeth, hydrALAZINE, ondansetron (ZOFRAN) IV, umeclidinium bromide    Anti-infectives (From admission, onward)   Start     Dose/Rate Route Frequency Ordered Stop   11/07/20 0000  vancomycin (VANCOCIN) 50 mg/mL oral solution        125 mg Oral 4 times daily 11/07/20 1047  11/12/20 2359   11/04/20 0000  vancomycin (VANCOCIN) 50 mg/mL oral solution  Status:  Discontinued        125 mg Oral 4 times daily 11/04/20 1201 11/07/20    11/03/20 1030  fluconazole (DIFLUCAN) tablet 150 mg        150 mg Oral  Once 11/03/20 0939 11/03/20 1320   10/31/20 1700  vancomycin (VANCOCIN) IVPB 1000 mg/200 mL premix  Status:  Discontinued        1,000 mg 200 mL/hr over 60 Minutes Intravenous Every 48 hours 10/29/20 1640 10/30/20 0828   10/30/20 1530  vancomycin (VANCOCIN) 50 mg/mL oral solution 125 mg        125 mg Oral  4 times daily 10/30/20 1434 11/09/20 1359   10/30/20 0200  piperacillin-tazobactam (ZOSYN) IVPB 3.375 g  Status:  Discontinued        3.375 g 12.5 mL/hr over 240 Minutes Intravenous Every 8 hours 10/29/20 1650 10/30/20 1602   10/29/20 1945  piperacillin-tazobactam (ZOSYN) IVPB 3.375 g  Status:  Discontinued        3.375 g 100 mL/hr over 30 Minutes Intravenous  Once 10/29/20 1936 10/29/20 1941   10/29/20 1700  piperacillin-tazobactam (ZOSYN) IVPB 3.375 g        3.375 g 100 mL/hr over 30 Minutes Intravenous  Once 10/29/20 1648 10/29/20 2007   10/29/20 1645  vancomycin (VANCOREADY) IVPB 1500 mg/300 mL        1,500 mg 150 mL/hr over 120 Minutes Intravenous  Once 10/29/20 1639 10/29/20 1950   10/29/20 1630  vancomycin (VANCOCIN) IVPB 1000 mg/200 mL premix  Status:  Discontinued        1,000 mg 200 mL/hr over 60 Minutes Intravenous  Once 10/29/20 1621 10/29/20 1639        Objective:   Vitals:   11/10/20 1405 11/10/20 1653 11/10/20 2208 11/11/20 0444  BP: 121/77 124/79 (!) 153/84 (!) 135/94  Pulse: 83 90 82 85  Resp:   18 18  Temp: 99 F (37.2 C) 99 F (37.2 C) 98.7 F (37.1 C) 98.7 F (37.1 C)  TempSrc: Oral Oral Oral Oral  SpO2: 98%  94% 98%  Weight:      Height:        Wt Readings from Last 3 Encounters:  10/29/20 78.9 kg  10/09/20 79.4 kg  10/01/20 78.9 kg     Intake/Output Summary (Last 24 hours) at 11/11/2020 1703 Last data filed at  11/11/2020 0648 Gross per 24 hour  Intake 480 ml  Output 1500 ml  Net -1020 ml   Physical Exam  Gen:- Awake Alert,  In no apparent distress  HEENT:- [place].AT, No sclera icterus Neck-Supple Neck,No JVD,.  Lungs-  CTAB , fair symmetrical air movement CV- S1, S2 normal, regular  Abd-  +ve B.Sounds, Abd Soft, No tenderness,    Extremity/Skin:- No  edema, pedal pulses present  Psych-affect is appropriate, oriented x3 Neuro-generalized weakness, no new focal deficits, no tremors   Data Review:   Micro Results Recent Results (from the past 240 hour(s))  Resp Panel by RT-PCR (Flu A&B, Covid) Nasopharyngeal Swab     Status: None   Collection Time: 11/04/20 11:12 AM   Specimen: Nasopharyngeal Swab; Nasopharyngeal(NP) swabs in vial transport medium  Result Value Ref Range Status   SARS Coronavirus 2 by RT PCR NEGATIVE NEGATIVE Final    Comment: (NOTE) SARS-CoV-2 target nucleic acids are NOT DETECTED.  The SARS-CoV-2 RNA is generally detectable in upper respiratory specimens during the acute phase of infection. The lowest concentration of SARS-CoV-2 viral copies this assay can detect is 138 copies/mL. A negative result does not preclude SARS-Cov-2 infection and should not be used as the sole basis for treatment or other patient management decisions. A negative result may occur with  improper specimen collection/handling, submission of specimen other than nasopharyngeal swab, presence of viral mutation(s) within the areas targeted by this assay, and inadequate number of viral copies(<138 copies/mL). A negative result must be combined with clinical observations, patient history, and epidemiological information. The expected result is Negative.  Fact Sheet for Patients:  EntrepreneurPulse.com.au  Fact Sheet for Healthcare Providers:  IncredibleEmployment.be  This test is no t yet approved or cleared by the  [place] Architectural technologist and  has been authorized  for detection and/or diagnosis of SARS-CoV-2 by FDA under an Print production planner (EUA). This EUA will remain  in effect (meaning this test can be used) for the duration of the COVID-19 declaration under Section 564(b)(1) of the Act, 21 U.S.C.section 360bbb-3(b)(1), unless the authorization is terminated  or revoked sooner.       Influenza A by PCR NEGATIVE NEGATIVE Final   Influenza B by PCR NEGATIVE NEGATIVE Final    Comment: (NOTE) The Xpert Xpress SARS-CoV-2/FLU/RSV plus assay is intended as an aid in the diagnosis of influenza from Nasopharyngeal swab specimens and should not be used as a sole basis for treatment. Nasal washings and aspirates are unacceptable for Xpert Xpress SARS-CoV-2/FLU/RSV testing.  Fact Sheet for Patients: EntrepreneurPulse.com.au  Fact Sheet for Healthcare Providers: IncredibleEmployment.be  This test is not yet approved or cleared by the [place] FDA and has been authorized for detection and/or diagnosis of SARS-CoV-2 by FDA under an Emergency Use Authorization (EUA). This EUA will remain in effect (meaning this test can be used) for the duration of the COVID-19 declaration under Section 564(b)(1) of the Act, 21 U.S.C. section 360bbb-3(b)(1), unless the authorization is terminated or revoked.  Performed at [facility], [address] [place], [place] [address]     Radiology Reports CT ABDOMEN PELVIS WO CONTRAST  Result Date: 10/29/2020 CLINICAL DATA:  Nausea vomiting EXAM: CT ABDOMEN AND PELVIS WITHOUT CONTRAST TECHNIQUE: Multidetector CT imaging of the abdomen and pelvis was performed following the standard protocol without IV contrast. COMPARISON:  Radiograph 08/21/2020, CT 07/12/2018 FINDINGS: Lower chest: Lung bases demonstrate small bilateral pleural effusions. Hazy posterior lung bases likely atelectasis. Cardiomegaly. Mild circumferential distal esophageal thickening. Hepatobiliary: No focal  liver abnormality is seen. No gallstones, gallbladder wall thickening, or biliary dilatation. Pancreas: Unremarkable. No pancreatic ductal dilatation or surrounding inflammatory changes. Spleen: Normal in size without focal abnormality. Adrenals/Urinary Tract: Adrenal glands are within normal limits. Kidneys show no hydronephrosis. Nonspecific perinephric fat stranding. Markedly distended urinary bladder. Stomach/Bowel: The stomach is nonenlarged. No dilated small bowel. Negative appendix. No acute bowel wall thickening. Vascular/Lymphatic: Moderate aortic atherosclerosis. No aneurysm. No suspicious nodes. Reproductive: Uterus is unremarkable.  No adnexal mass. Other: Negative for free air or free fluid. Musculoskeletal: Moderate superior endplate compression deformity at L1 with about 40% loss of height of anterior vertebral body. Degenerative changes most advanced at L3-L4 and L5-S1. IMPRESSION: 1. No CT evidence for acute intra-abdominal or pelvic abnormality. 2. Mild distal esophageal thickening, question esophagitis or reflux. 3. Small bilateral pleural effusions. Cardiomegaly. 4. Moderate superior endplate compression deformity at L1, of uncertain age, new since 2019. Aortic Atherosclerosis (ICD10-I70.0). Electronically Signed   By: Donavan Foil M.D.   On: 10/29/2020 18:57   DG CHEST PORT 1 VIEW  Result Date: 11/02/2020 CLINICAL DATA:  Chest pain when swallowing water EXAM: PORTABLE CHEST 1 VIEW COMPARISON:  10/29/2020 FINDINGS: Cardiomegaly and aortic tortuosity. Stable lung markings. There is no edema, consolidation, effusion, or pneumothorax. IMPRESSION: 1. No acute finding when compared to prior. 2. Cardiomegaly. Electronically Signed   By: Monte Fantasia M.D.   On: 11/02/2020 04:36   DG Chest Port 1 View  Result Date: 10/29/2020 CLINICAL DATA:  Questionable sepsis. EXAM: PORTABLE CHEST 1 VIEW COMPARISON:  August 19, 2020. FINDINGS: Enlarged cardiac silhouette, similar to prior. No confluent  consolidation. Streaky left basilar opacity. No visible pleural effusions or pneumothorax on this semi upright portable radiograph. No acute osseous abnormality. IMPRESSION: Streaky  left basilar opacity, favored to reflect atelectasis. No confluent consolidation. Electronically Signed   By: Margaretha Sheffield MD   On: 10/29/2020 15:04     CBC Recent Labs  Lab 11/09/20 1034 11/11/20 0615 11/11/20 1133  WBC 7.3 6.4 7.0  HGB 11.2* 10.6* 9.9*  HCT 36.2 34.3* 32.9*  PLT 255 238 233  MCV 94.5 93.5 93.7  MCH 29.2 28.9 28.2  MCHC 30.9 30.9 30.1  RDW 13.9 13.5 13.4    Chemistries  Recent Labs  Lab 11/09/20 1034  NA 141  K 4.2  CL 111  CO2 23  GLUCOSE 238*  BUN 9  CREATININE 1.36*  CALCIUM 8.1*  AST 38  ALT 41  ALKPHOS 83  BILITOT 0.4   ------------------------------------------------------------------------------------------------------------------ No results for input(s): CHOL, HDL, LDLCALC, TRIG, CHOLHDL, LDLDIRECT in the last 72 hours.  Lab Results  Component Value Date   HGBA1C 13.0 (H) 10/29/2020   ------------------------------------------------------------------------------------------------------------------ No results for input(s): TSH, T4TOTAL, T3FREE, THYROIDAB in the last 72 hours.  Invalid input(s): FREET3 ------------------------------------------------------------------------------------------------------------------ No results for input(s): VITAMINB12, FOLATE, FERRITIN, TIBC, IRON, RETICCTPCT in the last 72 hours.  Coagulation profile No results for input(s): INR, PROTIME in the last 168 hours.  No results for input(s): DDIMER in the last 72 hours.  Cardiac Enzymes No results for input(s): CKMB, TROPONINI, MYOGLOBIN in the last 168 hours.  Invalid input(s): CK ------------------------------------------------------------------------------------------------------------------    Component Value Date/Time   BNP 835.0 (H) 06/22/2017 2811   Roxan Hockey M.D on 11/11/2020 at 5:03 PM  Go to [email or web address] - for contact info  [facility] - Office  [phone]

## 2020-11-11 NOTE — Progress Notes (Signed)
Patient called out complaining of nausea and vomiting. Red spot found on floor the diameter of a soft ball. Notified MD

## 2020-11-12 ENCOUNTER — Encounter: Payer: Medicare Other | Admitting: Family Medicine

## 2020-11-12 ENCOUNTER — Encounter (HOSPITAL_COMMUNITY)
Admission: EM | Disposition: A | Discharge status: Skilled Nursing Facility | Payer: Self-pay | Source: Home / Self Care | Attending: Family Medicine

## 2020-11-12 ENCOUNTER — Inpatient Hospital Stay (HOSPITAL_COMMUNITY): Payer: Medicare Other | Admitting: Anesthesiology

## 2020-11-12 ENCOUNTER — Other Ambulatory Visit: Payer: Self-pay

## 2020-11-12 DIAGNOSIS — K920 Hematemesis: Secondary | ICD-10-CM

## 2020-11-12 DIAGNOSIS — K269 Duodenal ulcer, unspecified as acute or chronic, without hemorrhage or perforation: Secondary | ICD-10-CM

## 2020-11-12 DIAGNOSIS — K209 Esophagitis, unspecified without bleeding: Secondary | ICD-10-CM

## 2020-11-12 HISTORY — PX: ESOPHAGOGASTRODUODENOSCOPY (EGD) WITH PROPOFOL: SHX5813

## 2020-11-12 HISTORY — PX: BIOPSY: SHX5522

## 2020-11-12 LAB — COMPREHENSIVE METABOLIC PANEL
ALT: 18 U/L (ref 0–44)
AST: 10 U/L — ABNORMAL LOW (ref 15–41)
Albumin: 1.9 g/dL — ABNORMAL LOW (ref 3.5–5.0)
Alkaline Phosphatase: 60 U/L (ref 38–126)
Anion gap: 6 (ref 5–15)
BUN: 18 mg/dL (ref 6–20)
CO2: 27 mmol/L (ref 22–32)
Calcium: 8.4 mg/dL — ABNORMAL LOW (ref 8.9–10.3)
Chloride: 110 mmol/L (ref 98–111)
Creatinine, Ser: 1.21 mg/dL — ABNORMAL HIGH (ref 0.44–1.00)
GFR, Estimated: 53 mL/min — ABNORMAL LOW (ref >60)
Glucose, Bld: 160 mg/dL — ABNORMAL HIGH (ref 70–99)
Potassium: 3.8 mmol/L (ref 3.5–5.1)
Sodium: 143 mmol/L (ref 135–145)
Total Bilirubin: 0.3 mg/dL (ref 0.3–1.2)
Total Protein: 4.8 g/dL — ABNORMAL LOW (ref 6.5–8.1)

## 2020-11-12 LAB — GLUCOSE, CAPILLARY
Glucose-Capillary: 147 mg/dL — ABNORMAL HIGH (ref 70–99)
Glucose-Capillary: 211 mg/dL — ABNORMAL HIGH (ref 70–99)
Glucose-Capillary: 175 mg/dL — ABNORMAL HIGH (ref 70–99)
Glucose-Capillary: 172 mg/dL — ABNORMAL HIGH (ref 70–99)
Glucose-Capillary: 156 mg/dL — ABNORMAL HIGH (ref 70–99)
Glucose-Capillary: 114 mg/dL — ABNORMAL HIGH (ref 70–99)
Glucose-Capillary: 193 mg/dL — ABNORMAL HIGH (ref 70–99)
Glucose-Capillary: 149 mg/dL — ABNORMAL HIGH (ref 70–99)
Glucose-Capillary: 134 mg/dL — ABNORMAL HIGH (ref 70–99)
Glucose-Capillary: 107 mg/dL — ABNORMAL HIGH (ref 70–99)
Glucose-Capillary: 171 mg/dL — ABNORMAL HIGH (ref 70–99)
Glucose-Capillary: 212 mg/dL — ABNORMAL HIGH (ref 70–99)

## 2020-11-12 LAB — CBC
HCT: 31.2 % — ABNORMAL LOW (ref 36.0–46.0)
Hemoglobin: 9.5 g/dL — ABNORMAL LOW (ref 12.0–15.0)
MCH: 28.6 pg (ref 26.0–34.0)
MCHC: 30.4 g/dL (ref 30.0–36.0)
MCV: 94 fL (ref 80.0–100.0)
Platelets: 241 10*3/uL (ref 150–400)
RBC: 3.32 MIL/uL — ABNORMAL LOW (ref 3.87–5.11)
RDW: 13.6 % (ref 11.5–15.5)
WBC: 5.7 10*3/uL (ref 4.0–10.5)
nRBC: 0 % (ref 0.0–0.2)

## 2020-11-12 SURGERY — ESOPHAGOGASTRODUODENOSCOPY (EGD) WITH PROPOFOL
Anesthesia: General

## 2020-11-12 MED ORDER — STERILE WATER FOR IRRIGATION IR SOLN
Status: DC | PRN
  Administered 2020-11-12: 1.5 mL

## 2020-11-12 MED ORDER — LIDOCAINE HCL (CARDIAC) PF 100 MG/5ML IV SOSY
PREFILLED_SYRINGE | INTRAVENOUS | Status: DC | PRN
  Administered 2020-11-12: 80 mg via INTRAVENOUS

## 2020-11-12 MED ORDER — CALCIUM CARBONATE ANTACID 500 MG PO CHEW
1.0000 | CHEWABLE_TABLET | Freq: Two times a day (BID) | ORAL | Status: DC
  Administered 2020-11-12 – 2020-11-14 (×5): 200 mg via ORAL
  Filled 2020-11-12 (×5): qty 1

## 2020-11-12 MED ORDER — PROPOFOL 500 MG/50ML IV EMUL
INTRAVENOUS | Status: DC | PRN
  Administered 2020-11-12: 100 ug/kg/min via INTRAVENOUS
  Administered 2020-11-12: 80 mg via INTRAVENOUS

## 2020-11-12 MED ORDER — LACTATED RINGERS IV SOLN
INTRAVENOUS | Status: DC | PRN
Start: 2020-11-12 — End: 2020-11-12

## 2020-11-12 NOTE — Progress Notes (Signed)
Upon entering room, this nurse found pt eating a [employer] fish sandwich. Per GI pt is suppose to be on a CL diet. Pt refused and said she is going to eat whatever she wants. Education given. MD notified.

## 2020-11-12 NOTE — Anesthesia Postprocedure Evaluation (Signed)
Anesthesia Post Note  Patient: [name]  Procedure(s) Performed: ESOPHAGOGASTRODUODENOSCOPY (EGD) WITH PROPOFOL (N/A ) BIOPSY  Patient location during evaluation: Endoscopy Anesthesia Type: General Level of consciousness: awake, oriented, awake and alert and patient cooperative Pain management: pain level controlled Vital Signs Assessment: post-procedure vital signs reviewed and stable Respiratory status: spontaneous breathing, respiratory function stable and nonlabored ventilation Cardiovascular status: blood pressure returned to baseline and stable Postop Assessment: no headache and no backache Anesthetic complications: no   No complications documented.   Last Vitals:  Vitals:   11/12/20 1208 11/12/20 1210  BP:  (!) 144/86  Pulse: 79   Resp: 17   Temp: 37.3 C   SpO2: 94%     Last Pain:  Vitals:   11/12/20 1238  TempSrc:   PainSc: 0-No pain                 Tacy Learn

## 2020-11-12 NOTE — Transfer of Care (Signed)
Immediate Anesthesia Transfer of Care Note  Patient: [name]  Procedure(s) Performed: ESOPHAGOGASTRODUODENOSCOPY (EGD) WITH PROPOFOL (N/A ) BIOPSY  Patient Location: Endoscopy Unit  Anesthesia Type:General  Level of Consciousness: awake, alert , oriented and patient cooperative  Airway & Oxygen Therapy: Patient Spontanous Breathing  Post-op Assessment: Report given to RN, Post -op Vital signs reviewed and stable and Patient moving all extremities  Post vital signs: Reviewed and stable  Last Vitals:  Vitals Value Taken Time  BP    Temp    Pulse    Resp    SpO2      Last Pain:  Vitals:   11/12/20 1238  TempSrc:   PainSc: 0-No pain      Patients Stated Pain Goal: 1 (95/18/84 1660)  Complications: No complications documented.

## 2020-11-12 NOTE — Op Note (Addendum)
[facility] Patient Name: [name] Procedure Date: 11/12/2020 12:26 PM MRN: [id number] Date of Birth: Mar 03, 1966 Attending MD: [name] ,  CSN: [id number] Age: 55 Admit Type: Inpatient Procedure:                Upper GI endoscopy Indications:              Hematemesis Providers:                [name], Charlsie Quest. Theda Sers RN, RN, Raphael Gibney, Technician Referring MD:              Medicines:                Monitored Anesthesia Care Complications:            No immediate complications. Estimated Blood Loss:     Estimated blood loss: none. Procedure:                Pre-Anesthesia Assessment:                           - Prior to the procedure, a History and Physical                            was performed, and patient medications, allergies                            and sensitivities were reviewed. The patient's                            tolerance of previous anesthesia was reviewed.                           - The risks and benefits of the procedure and the                            sedation options and risks were discussed with the                            patient. All questions were answered and informed                            consent was obtained.                           - ASA Grade Assessment: III - A patient with severe                            systemic disease.                           After obtaining informed consent, the endoscope was                            passed under direct vision. Throughout the  procedure, the patient's blood pressure, pulse, and                            oxygen saturations were monitored continuously. The                            [id number]) was introduced through the mouth,                            and advanced to the second part of duodenum. The                            upper GI endoscopy was accomplished without                             difficulty. The patient tolerated the procedure                            well. Scope In: 12:40:11 PM Scope Out: 12:50:40 PM Total Procedure Duration: 0 hours 10 minutes 29 seconds  Findings:      LA Grade C (one or more mucosal breaks continuous between tops of 2 or       more mucosal folds, less than 75% circumference) esophagitis with no       bleeding was found 22 to 44 cm from the incisors.      The entire examined stomach was normal. Biopsies were taken with a cold       forceps for Helicobacter pylori testing.      At least six non-bleeding superficial duodenal ulcers with a clean ulcer       base (Forrest Class III) were found in the duodenal bulb, in the first       portion of the duodenum and in the second portion of the duodenum. The       largest lesion was 10 mm in largest dimension. There was presence of       duodenitis characterized by edema and erythema in a patchy distribution. Impression:               - LA Grade C esophagitis with no bleeding.                           - Normal stomach. Biopsied.                           - Non-bleeding duodenal ulcers with a clean ulcer                            base (Forrest Class III). Moderate Sedation:      Per Anesthesia Care Recommendation:           - Return patient to hospital ward for ongoing care.                           - Clear liquid diet today.                           - No ibuprofen, naproxen, or  other non-steroidal                            anti-inflammatory drugs.                           - Await pathology results.                           - Use Protonix (pantoprazole) 40 mg PO BID for 3                            months, then once a day indefinitely. Procedure Code(s):        --- Professional ---                           [phone], Esophagogastroduodenoscopy, flexible,                            transoral; with biopsy, single or multiple Diagnosis Code(s):        --- Professional ---                            K20.90, Esophagitis, unspecified without bleeding                           K26.9, Duodenal ulcer, unspecified as acute or                            chronic, without hemorrhage or perforation                           K92.0, Hematemesis CPT copyright 2019 American Medical Association. All rights reserved. The codes documented in this report are preliminary and upon coder review may  be revised to meet current compliance requirements. [name], MD [name],  11/12/2020 12:58:40 PM This report has been signed electronically. Number of Addenda: 0

## 2020-11-12 NOTE — Plan of Care (Signed)
  Problem: Education: Goal: Knowledge of General Education information will improve Description: Including pain rating scale, medication(s)/side effects and non-pharmacologic comfort measures Outcome: Progressing   Problem: Health Behavior/Discharge Planning: Goal: Ability to manage health-related needs will improve Outcome: Progressing   Problem: Clinical Measurements: Goal: Ability to maintain clinical measurements within normal limits will improve Outcome: Progressing Goal: Will remain free from infection Outcome: Progressing Goal: Diagnostic test results will improve Outcome: Progressing Goal: Respiratory complications will improve Outcome: Progressing Goal: Cardiovascular complication will be avoided Outcome: Progressing   Problem: Activity: Goal: Risk for activity intolerance will decrease Outcome: Progressing   Problem: Nutrition: Goal: Adequate nutrition will be maintained Outcome: Progressing   Problem: Coping: Goal: Level of anxiety will decrease Outcome: Progressing   Problem: Elimination: Goal: Will not experience complications related to bowel motility Outcome: Progressing Goal: Will not experience complications related to urinary retention Outcome: Progressing   Problem: Pain Managment: Goal: General experience of comfort will improve Outcome: Progressing   Problem: Safety: Goal: Ability to remain free from injury will improve Outcome: Progressing   Problem: Skin Integrity: Goal: Risk for impaired skin integrity will decrease Outcome: Progressing   Problem: Education: Goal: Ability to describe self-care measures that may prevent or decrease complications (Diabetes Survival Skills Education) will improve Outcome: Progressing Goal: Individualized Educational Video(s) Outcome: Progressing   Problem: Cardiac: Goal: Ability to maintain an adequate cardiac output will improve Outcome: Progressing   Problem: Health Behavior/Discharge  Planning: Goal: Ability to identify and utilize available resources and services will improve Outcome: Progressing Goal: Ability to manage health-related needs will improve Outcome: Progressing   Problem: Fluid Volume: Goal: Ability to achieve a balanced intake and output will improve Outcome: Progressing   Problem: Respiratory: Goal: Will regain and/or maintain adequate ventilation Outcome: Progressing   Problem: Nutritional: Goal: Maintenance of adequate nutrition will improve Outcome: Progressing Goal: Maintenance of adequate weight for body size and type will improve Outcome: Progressing   Problem: Urinary Elimination: Goal: Ability to achieve and maintain adequate renal perfusion and functioning will improve Outcome: Progressing

## 2020-11-12 NOTE — Anesthesia Preprocedure Evaluation (Addendum)
Anesthesia Evaluation  Patient identified by MRN, date of birth, ID band Patient awake    Reviewed: Allergy & Precautions, NPO status , Patient's Chart, lab work & pertinent test results, reviewed documented beta blocker date and time   History of Anesthesia Complications Negative for: history of anesthetic complications  Airway Mallampati: III  TM Distance: >3 FB Neck ROM: Full    Dental  (+) Dental Advisory Given, Upper Dentures, Lower Dentures   Pulmonary sleep apnea , COPD,  COPD inhaler, former smoker,           Cardiovascular Exercise Tolerance: Poor hypertension, Pt. on medications and Pt. on home beta blockers +CHF  Normal cardiovascular exam+ dysrhythmias Supra Ventricular Tachycardia  Rhythm:Regular Rate:Normal  1. Left ventricular ejection fraction, by estimation, is 50 to 55%. The left ventricle has low normal function. The left ventricle has no regional wall motion abnormalities. There is moderate left ventricular hypertrophy.  Left ventricular diastolic parameters are indeterminate.  2. Right ventricular systolic function is normal. The right ventricular size is normal. There is normal pulmonary artery systolic pressure. The estimated right ventricular systolic pressure is 66.0 mmHg.  3. Left atrial size was mild to moderately dilated.  4. The mitral valve is grossly normal. Mild mitral valve regurgitation.  5. The aortic valve is tricuspid. Aortic valve regurgitation is not visualized.  6. The inferior vena cava is normal in size with greater than 50% respiratory variability, suggesting right atrial pressure of 3 mmHg   Neuro/Psych Seizures -, Poorly Controlled,  PSYCHIATRIC DISORDERS Anxiety Depression  Neuromuscular disease CVA    GI/Hepatic negative GI ROS, (+)     substance abuse  cocaine use,   Endo/Other  diabetes, Type 2, Oral Hypoglycemic Agents  Renal/GU Renal InsufficiencyRenal disease      Musculoskeletal  (+) Arthritis ,   Abdominal   Peds  Hematology  (+) anemia ,   Anesthesia Other Findings   Reproductive/Obstetrics                          Anesthesia Physical Anesthesia Plan  ASA: III  Anesthesia Plan: General   Post-op Pain Management:    Induction: Intravenous  PONV Risk Score and Plan: TIVA and Propofol infusion  Airway Management Planned: Nasal Cannula and Natural Airway  Additional Equipment:   Intra-op Plan:   Post-operative Plan:   Informed Consent: I have reviewed the patients History and Physical, chart, labs and discussed the procedure including the risks, benefits and alternatives for the proposed anesthesia with the patient or authorized representative who has indicated his/her understanding and acceptance.     Dental advisory given  Plan Discussed with: CRNA and Surgeon  Anesthesia Plan Comments:         Anesthesia Quick Evaluation

## 2020-11-12 NOTE — Anesthesia Procedure Notes (Signed)
Date/Time: 11/12/2020 12:33 PM Performed by: Tacy Learn, CRNA Pre-anesthesia Checklist: Patient identified, Emergency Drugs available, Suction available, Patient being monitored and Timeout performed Oxygen Delivery Method: Nasal cannula

## 2020-11-12 NOTE — Progress Notes (Signed)
Physical Therapy Treatment Patient Details Name: [name] MRN: [id number] DOB: Apr 04, 1966 Today's Date: 11/12/2020    History of Present Illness HPI: The patient is a 55 y.o. year-old w/ hx of CVA, seizures, obesity, tobacco abuse, CM EF 25%, HL, hx GIB, HTN, diabetes on insulin, Charcot foot, COPD, cocaine abuse, CKD 3, chronic syst/ diast CHF, anemia, etoh use presented to ED by EMS w/ c/o laying in bed for 2 days w/o medications.  She had reportedly been vomiting and having diarrhea and has not been able to keep down her medication. EMS reported pt's boyfriend left 2 days ago and did not return, we has helping to take care of her and give her her medications. EMS BP 80/60, temp 94.5, HR 160 and CBG 414. In ED RR 31, temp 94.8, HR 145 - 162, RA 97% sat, BP 70/30 >> 150/70 after cardioversion. BS > 600, Na 133 CO2 17  AG 25  Glu 874  BUN 61  Cr 2.47  Ca 8.8  Alb 3.2  LFT's okay, egfr 22  WBC 17K  Hb 16.6  plt 327  Lactic 2.3 > 1.9  Beta-hydroxy ^ 7.16, tsh 0.5   CXR showed LLL atelectasis, no consolidation. EKG showed wide-complex tachycardia at 181 bpm afib w/ RVR, QRS 125 msec. Repeat ekg after cardioversion x 2 (120J, 150J) showed sinus tach at 104 bpm.  In ED she was cardioverted as above and started on IV insulin drip and received IV vanc 1500 mg x 1, IV zosyn also ordered. Got 4 L total bolus IVF"s. Blood and urine cx's sent.  Asked to see for admission.    PT Comments    Patient demonstrates increased endurance/distance for taking side steps at bedside and able to transfer to chair.  Patient demonstrates fair/good return for completing BLE ROM/strengthening exercises while seated at bedside with minimal verbal cueing and demonstration and tolerated sitting up in chair after therapy - RN notified.  Patient will benefit from continued physical therapy in hospital and recommended venue below to increase strength, balance, endurance for safe ADLs and gait.   Follow Up Recommendations  SNF      Equipment Recommendations  None recommended by PT    Recommendations for Other Services       Precautions / Restrictions Precautions Precautions: Fall Restrictions Weight Bearing Restrictions: No    Mobility  Bed Mobility Overal bed mobility: Needs Assistance Bed Mobility: Supine to Sit     Supine to sit: Min guard     General bed mobility comments: increased time, labored movement with most difficulty scooting to EOB  Transfers Overall transfer level: Needs assistance Equipment used: Rolling walker (2 wheeled) Transfers: Sit to/from [facility] Sit to Stand: Min assist;Mod assist Stand pivot transfers: Mod assist       General transfer comment: increased time, labored movement  Ambulation/Gait Ambulation/Gait assistance: Mod assist Gait Distance (Feet): 6 Feet Assistive device: Rolling walker (2 wheeled) Gait Pattern/deviations: Decreased step length - left;Decreased step length - right;Decreased stride length Gait velocity: decreased   General Gait Details: limited to 6-7 slow labored side steps at bedside secondary to fatigue and generalized weakness   Stairs             Wheelchair Mobility    Modified Rankin (Stroke Patients Only)       Balance Overall balance assessment: Needs assistance Sitting-balance support: Feet supported Sitting balance-Leahy Scale: Fair Sitting balance - Comments: fair/good seated at EOB   Standing balance support: During  functional activity;Bilateral upper extremity supported Standing balance-Leahy Scale: Poor Standing balance comment: fair/poor using RW                            Cognition Arousal/Alertness: Awake/alert Behavior During Therapy: WFL for tasks assessed/performed Overall Cognitive Status: Within Functional Limits for tasks assessed                                        Exercises General Exercises - Lower Extremity Long Arc Quad:  Seated;AROM;Strengthening;Both;20 reps Hip Flexion/Marching: Seated;AROM;Strengthening;Both;20 reps Toe Raises: Seated;AROM;Strengthening;Both;20 reps Heel Raises: Seated;AROM;Strengthening;Both;20 reps    General Comments        Pertinent Vitals/Pain Pain Assessment: No/denies pain    Home Living                      Prior Function            PT Goals (current goals can now be found in the care plan section) Acute Rehab PT Goals Patient Stated Goal: return home after rehab PT Goal Formulation: With patient Time For Goal Achievement: 11/15/20 Potential to Achieve Goals: Good Progress towards PT goals: Progressing toward goals    Frequency    Min 3X/week      PT Plan Current plan remains appropriate    Co-evaluation              AM-PAC PT "6 Clicks" Mobility   Outcome Measure  Help needed turning from your back to your side while in a flat bed without using bedrails?: A Little Help needed moving from lying on your back to sitting on the side of a flat bed without using bedrails?: A Little Help needed moving to and from a bed to a chair (including a wheelchair)?: A Lot Help needed standing up from a chair using your arms (e.g., wheelchair or bedside chair)?: A Lot Help needed to walk in hospital room?: A Lot Help needed climbing 3-5 steps with a railing? : Total 6 Click Score: 13    End of Session   Activity Tolerance: Patient tolerated treatment well;Patient limited by fatigue Patient left: in chair;with call bell/phone within reach;with chair alarm set Nurse Communication: Mobility status PT Visit Diagnosis: Other abnormalities of gait and mobility (R26.89);Unsteadiness on feet (R26.81);Muscle weakness (generalized) (M62.81)     Time: 5462-7035 PT Time Calculation (min) (ACUTE ONLY): 25 min  Charges:  $Therapeutic Exercise: 8-22 mins $Therapeutic Activity: 8-22 mins                     4:00 PM, 11/12/20 Lonell Grandchild, MPT Physical  Therapist with [facility] 336 [phone] office [phone] mobile phone

## 2020-11-12 NOTE — Progress Notes (Signed)
We will proceed with EGD as scheduled.  I thoroughly discussed with the patient his procedure, including the risks involved. Patient understands what the procedure involves including the benefits and any risks. Patient understands alternatives to the proposed procedure. Risks including (but not limited to) bleeding, tearing of the lining (perforation), rupture of adjacent organs, problems with heart and lung function, infection, and medication reactions. A small percentage of complications may require surgery, hospitalization, repeat endoscopic procedure, and/or transfusion.  Patient understood and agreed.  Loralai Eisman Castaneda, MD Gastroenterology and Hepatology [facility]  

## 2020-11-12 NOTE — Brief Op Note (Signed)
10/29/2020 - 11/12/2020  12:59 PM  PATIENT:  [name]  55 y.o. female  PRE-OPERATIVE DIAGNOSIS:  hematemesis  POST-OPERATIVE DIAGNOSIS:  esophagitis, duodenitis, duodenal ulcers  PROCEDURE:  Procedure(s): ESOPHAGOGASTRODUODENOSCOPY (EGD) WITH PROPOFOL (N/A) BIOPSY  SURGEON:  Surgeon(s) and Role:    * Harvel Quale, MD - Primary  I performed esophagogastroduodenospy under propofol sedation through the patient was found to have grade C esophagitis extending between 22 and 44 cm.  Stomach showed few erosions in the antrum but no other alterations.  Random biopsies were collected to rule out H. pylori.  Duodenum had presence of multiple superficial ulcers (Forrest III) without presence of any spontaneous bleeding, these were surrounded by erythema and edema.  Ulcers likely related to cheonic Advil intake.  RECOMMENDATIONS: - Return patient to hospital ward for ongoing care.  - Clear liquid diet today.  - No ibuprofen, naproxen, or other non-steroidal anti-inflammatory drugs.  - Await pathology results.  - Use Protonix (pantoprazole) 40 mg PO BID for 3 months, then once a day indefinitely.  - Hold AC for now.  [name], MD Gastroenterology and Hepatology [facility]

## 2020-11-12 NOTE — TOC Progression Note (Signed)
Transition of Care [facility]) - Progression Note    Patient Details  Name: [name] MRN: [id number] Date of Birth: 1966-09-12  [facility] CM/SW Contact  Natasha Bence, LCSW Phone Number: 11/12/2020, 3:38 PM  Clinical Narrative:    CSW informed patient no longer medically stable per MD. CSW restarted auth for patient. Auth approved. [name] with [name] reported mangagement had a concern about taking patient with money owed although patient agreeable to sign over disability check. [name] confirmed with [facility] management that they are agreeable to take patient without immediate repayment of money owed with the continued stipulation that patient will sign over check. TOC to follow.    Expected Discharge Plan: [place] Barriers to Discharge: Continued Medical Work up  Expected Discharge Plan and Services Expected Discharge Plan: [facility] In-house Referral: Clinical Social Work   Post Acute Care Choice: [place] Living arrangements for the past 2 months: Single Family Home Expected Discharge Date: 11/08/20                                     Social Determinants of Health (SDOH) Interventions    Readmission Risk Interventions Readmission Risk Prevention Plan 10/31/2020 08/27/2020  Transportation Screening Complete Complete  Medication Review [employer]) Complete Complete  PCP or Specialist appointment within 3-5 days of discharge - Complete  HRI or [place] - Complete  SW Recovery Care/Counseling Consult Complete Complete  Palliative Care Screening Not Applicable Not Applicable  Skilled Nursing Facility Complete Complete  Some recent data might be hidden

## 2020-11-12 NOTE — Progress Notes (Signed)
Patient Demographics:    [name], is a 55 y.o. female, DOB - 1966-08-03, WKM:[id number]  Admit date - 10/29/2020   Admitting Physician Roney Jaffe, MD  Outpatient Primary MD for the patient is [name], MD  LOS - 14   Chief Complaint  Patient presents with  . Hypotension        Subjective:    [name] today has no fevers, no emesis,  No chest pain,    -11/11/20-- -patient had repeated episodes of hematemesis late on 11/10/2020 1 overnight -Complains of epigastric discomfort -Plavix and Eliquis has been on hold since 11/10/2020  Assessment  & Plan :    Principal Problem:   DKA (diabetic ketoacidosis) ([place]) Active Problems:   Essential hypertension   Chronic pain syndrome   Chronic combined systolic and diastolic CHF (congestive heart failure) (HCC)   COPD (chronic obstructive pulmonary disease) (HCC)   AKI (acute kidney injury) (HCC)   PAF (paroxysmal atrial fibrillation) (HCC)   Atrial fibrillation with RVR (HCC)   Seizures ([place])   Shock ([place])   Brief Admission History:  55 y.o.year-old w/ hx of CVA, seizures, obesity, tobacco abuse, CM EF 25%, HL, hx GIB, HTN, diabetes on insulin, Charcot foot, COPD, cocaine abuse, CKD 3, chronic syst/ diast CHF, anemia, etoh use presented to ED by EMS w/ c/o laying in bed for 2 days w/o medications. She had reportedly been vomiting and having diarrhea and has not been able to keep down her medication. EMS reported pt's boyfriend left 2 days ago and did not return, we has helping to take care of her and give her her medications. EMS BP 80/60, temp 94.5, HR 160 and CBG 414. In ED RR 31, temp 94.8, HR 145 - 162, RA 97% sat, BP 70/30 >>150/70 after cardioversion. BS >600, Na 133 CO2 17 AG 25 Glu 874 BUN 61 Cr 2.47 Ca 8.8 Alb 3.2 LFT's okay, egfr 22 WBC 17K Hb 16.6 plt 327 Lactic 2.3 >1.9 Beta-hydroxy ^ 7.16, tsh 0.5 CXR  showed LLL atelectasis, noconsolidation.EKG showed wide-complex tachycardia at 181 bpm afib w/ RVR, QRS 125 msec. Repeat ekg after cardioversion x 2 (120J, 150J) showed sinus tach at 104 bpm. In ED she was cardioverted as above and started on IV insulin drip and received IV vanc 1500 mg x 1, IV zosyn also ordered. Got 4 L total bolus IVF"s. Blood and urine cx's sent. Asked to see for admission.   Pt seen in room, lives alone, uses a walker or wheelchair mostly, needs help caring for herself. Denies any recent CP or abd pain, +n/v and diarrhea, no fevers or chills. Vague historian.   Assessment & Plan:   Principal Problem:   DKA (diabetic ketoacidosis) ([name]) Active Problems:   Essential hypertension   Chronic pain syndrome   Chronic combined systolic and diastolic CHF (congestive heart failure) (HCC)   COPD (chronic obstructive pulmonary disease) (HCC)   AKI (acute kidney injury) (HCC)   Atrial fibrillation with RVR (HCC)   Seizures (HCC)   Shock ([place])  A/p 1)Atrial fibrillation with RVR - pt s/p 2 cardioversions performed in ED by ED staff.-Remains in sinus rhythm, continue metoprolol for rate control  -- Hold apixaban due to concerns for upper GI bleed -  Cardiology consult appreciated  2)Acute Gi Bleed:- pt with hematemesis and Melena--- had EGD on 11/12/2020 - LA Grade C esophagitis with no bleeding,  Normal stomach and Non-bleeding duodenal ulcers with a clean ulcer base ([place] Class III)-- Continue to Hold Eliquis  -C/n Protonix 40 mg bid  3)DKA--on admission, resolved, off IV insulin, continue subcu scheduled Lantus insulin along with NovoLog sliding scale  4)generalized weakness and debilitation -PT recommended SNF placement.,Unsteady gait, high fall risk, pt agres to  SNF Rehabtransfer  5)Chronic combined sys/diastolic heart failure - stable,  Restart Lasix  =-Echo with EF of 50 to 55% -Continue Toprol-XL  6)Epilepsy -stable, continue Keppra  7)COPD  - stable,no acute exacerbation at this time, no need for steroids,continue bronchodilators  8)Depression -continue Cymbalta  9)Acute on Chronic Anemia-- -Hemoglobin down to 9.5 , baseline around 11 -Last dose of Plavix and Eliquis was 11/10/2020 - monitor and transfuse as clinically indicated  10)C diff infection with diarrhea --overall much improved,  Completed oral Vanco on 11/09/20  DVT prophylaxis: apixaban -on hold okay to use SCDs Code Status: Full  Family Communication: none present  Disposition:  SNF placement  (awaiting isolation bed at SNF) -Disposition/Need for in-Hospital Stay- patient unable to be discharged at this time due to awaiting SNF bed transfer   Status is: Inpatient  Remains inpatient appropriate because:Please see above  Disposition: The patient is from: Home              Anticipated d/c is to: SNF              Anticipated d/c date is: 2 days  Consultants:   GI and cardiology  Procedures:  EGD 1/18/20220  Antimicrobials:  Vancomycin 1/6>>    Lab Results  Component Value Date   PLT 241 11/12/2020    Inpatient Medications  Scheduled Meds: . [MAR Hold] amLODipine  10 mg Oral Daily  . calcium carbonate  1 tablet Oral BID  . [MAR Hold] DULoxetine  60 mg Oral Daily  . [MAR Hold] gabapentin  300 mg Oral TID  . [MAR Hold] guaiFENesin  1,200 mg Oral BID  . [MAR Hold] insulin aspart  0-15 Units Subcutaneous TID WC  . [MAR Hold] insulin aspart  0-5 Units Subcutaneous QHS  . [MAR Hold] insulin aspart  3 Units Subcutaneous TID WC  . [MAR Hold] insulin glargine  9 Units Subcutaneous Q24H  . [MAR Hold] levETIRAcetam  750 mg Oral BID  . [MAR Hold] metoprolol succinate  50 mg Oral BID  . [MAR Hold] montelukast  10 mg Oral QHS  . [MAR Hold] pantoprazole (PROTONIX) IV  40 mg Intravenous Q12H  . [MAR Hold] rosuvastatin  10 mg Oral Daily  . [MAR Hold] saccharomyces boulardii  250 mg Oral BID   Continuous Infusions: PRN Meds:.[MAR Hold]  acetaminophen, [MAR Hold] albuterol, [MAR Hold] alum & mag hydroxide-simeth, [MAR Hold] hydrALAZINE, [MAR Hold] ondansetron (ZOFRAN) IV, [MAR Hold] umeclidinium bromide    Anti-infectives (From admission, onward)   Start     Dose/Rate Route Frequency Ordered Stop   11/07/20 0000  vancomycin (VANCOCIN) 50 mg/mL oral solution        125 mg Oral 4 times daily 11/07/20 1047 11/12/20 2359   11/04/20 0000  vancomycin (VANCOCIN) 50 mg/mL oral solution  Status:  Discontinued        125 mg Oral 4 times daily 11/04/20 1201 11/07/20    11/03/20 1030  fluconazole (DIFLUCAN) tablet 150 mg        150  mg Oral  Once 11/03/20 0939 11/03/20 1320   10/31/20 1700  vancomycin (VANCOCIN) IVPB 1000 mg/200 mL premix  Status:  Discontinued        1,000 mg 200 mL/hr over 60 Minutes Intravenous Every 48 hours 10/29/20 1640 10/30/20 0828   10/30/20 1530  vancomycin (VANCOCIN) 50 mg/mL oral solution 125 mg        125 mg Oral 4 times daily 10/30/20 1434 11/09/20 1359   10/30/20 0200  piperacillin-tazobactam (ZOSYN) IVPB 3.375 g  Status:  Discontinued        3.375 g 12.5 mL/hr over 240 Minutes Intravenous Every 8 hours 10/29/20 1650 10/30/20 1602   10/29/20 1945  piperacillin-tazobactam (ZOSYN) IVPB 3.375 g  Status:  Discontinued        3.375 g 100 mL/hr over 30 Minutes Intravenous  Once 10/29/20 1936 10/29/20 1941   10/29/20 1700  piperacillin-tazobactam (ZOSYN) IVPB 3.375 g        3.375 g 100 mL/hr over 30 Minutes Intravenous  Once 10/29/20 1648 10/29/20 2007   10/29/20 1645  vancomycin (VANCOREADY) IVPB 1500 mg/300 mL        1,500 mg 150 mL/hr over 120 Minutes Intravenous  Once 10/29/20 1639 10/29/20 1950   10/29/20 1630  vancomycin (VANCOCIN) IVPB 1000 mg/200 mL premix  Status:  Discontinued        1,000 mg 200 mL/hr over 60 Minutes Intravenous  Once 10/29/20 1621 10/29/20 1639        Objective:   Vitals:   11/12/20 1113 11/12/20 1208 11/12/20 1210 11/12/20 1255  BP: (!) 110/95  (!) 144/86 98/75  Pulse:  77 79  80  Resp:  17  (!) 22  Temp: 98.4 F (36.9 C) 99.2 F (37.3 C)  98.9 F (37.2 C)  TempSrc: Oral Oral  Oral  SpO2: 100% 94%  95%  Weight:      Height:  '5\' 1"'  (1.549 m)      Wt Readings from Last 3 Encounters:  10/29/20 78.9 kg  10/09/20 79.4 kg  10/01/20 78.9 kg     Intake/Output Summary (Last 24 hours) at 11/12/2020 1310 Last data filed at 11/12/2020 1250 Gross per 24 hour  Intake 540 ml  Output 1200 ml  Net -660 ml   Physical Exam  Gen:- Awake Alert,  In no apparent distress  HEENT:- [place].AT, No sclera icterus Neck-Supple Neck,No JVD,.  Lungs-  CTAB , fair symmetrical air movement CV- S1, S2 normal, regular  Abd-  +ve B.Sounds, Abd Soft, epigastric discomfort without rebound or guarding,    Extremity/Skin:- No  edema, pedal pulses present  Psych-affect is appropriate, oriented x3 Neuro-generalized weakness, no new focal deficits, no tremors   Data Review:   Micro Results Recent Results (from the past 240 hour(s))  Resp Panel by RT-PCR (Flu A&B, Covid) Nasopharyngeal Swab     Status: None   Collection Time: 11/04/20 11:12 AM   Specimen: Nasopharyngeal Swab; Nasopharyngeal(NP) swabs in vial transport medium  Result Value Ref Range Status   SARS Coronavirus 2 by RT PCR NEGATIVE NEGATIVE Final    Comment: (NOTE) SARS-CoV-2 target nucleic acids are NOT DETECTED.  The SARS-CoV-2 RNA is generally detectable in upper respiratory specimens during the acute phase of infection. The lowest concentration of SARS-CoV-2 viral copies this assay can detect is 138 copies/mL. A negative result does not preclude SARS-Cov-2 infection and should not be used as the sole basis for treatment or other patient management decisions. A negative result may occur with  improper specimen collection/handling, submission of specimen other than nasopharyngeal swab, presence of viral mutation(s) within the areas targeted by this assay, and inadequate number of viral copies(<138 copies/mL).  A negative result must be combined with clinical observations, patient history, and epidemiological information. The expected result is Negative.  Fact Sheet for Patients:  EntrepreneurPulse.com.au  Fact Sheet for Healthcare Providers:  IncredibleEmployment.be  This test is no t yet approved or cleared by the [place] FDA and  has been authorized for detection and/or diagnosis of SARS-CoV-2 by FDA under an Emergency Use Authorization (EUA). This EUA will remain  in effect (meaning this test can be used) for the duration of the COVID-19 declaration under Section 564(b)(1) of the Act, 21 U.S.C.section 360bbb-3(b)(1), unless the authorization is terminated  or revoked sooner.       Influenza A by PCR NEGATIVE NEGATIVE Final   Influenza B by PCR NEGATIVE NEGATIVE Final    Comment: (NOTE) The Xpert Xpress SARS-CoV-2/FLU/RSV plus assay is intended as an aid in the diagnosis of influenza from Nasopharyngeal swab specimens and should not be used as a sole basis for treatment. Nasal washings and aspirates are unacceptable for Xpert Xpress SARS-CoV-2/FLU/RSV testing.  Fact Sheet for Patients: EntrepreneurPulse.com.au  Fact Sheet for Healthcare Providers: IncredibleEmployment.be  This test is not yet approved or cleared by the [place] FDA and has been authorized for detection and/or diagnosis of SARS-CoV-2 by FDA under an Emergency Use Authorization (EUA). This EUA will remain in effect (meaning this test can be used) for the duration of the COVID-19 declaration under Section 564(b)(1) of the Act, 21 U.S.C. section 360bbb-3(b)(1), unless the authorization is terminated or revoked.  Performed at [facility], [address] [place], [place] [address]     Radiology Reports CT ABDOMEN PELVIS WO CONTRAST  Result Date: 10/29/2020 CLINICAL DATA:  Nausea vomiting EXAM: CT ABDOMEN AND PELVIS WITHOUT  CONTRAST TECHNIQUE: Multidetector CT imaging of the abdomen and pelvis was performed following the standard protocol without IV contrast. COMPARISON:  Radiograph 08/21/2020, CT 07/12/2018 FINDINGS: Lower chest: Lung bases demonstrate small bilateral pleural effusions. Hazy posterior lung bases likely atelectasis. Cardiomegaly. Mild circumferential distal esophageal thickening. Hepatobiliary: No focal liver abnormality is seen. No gallstones, gallbladder wall thickening, or biliary dilatation. Pancreas: Unremarkable. No pancreatic ductal dilatation or surrounding inflammatory changes. Spleen: Normal in size without focal abnormality. Adrenals/Urinary Tract: Adrenal glands are within normal limits. Kidneys show no hydronephrosis. Nonspecific perinephric fat stranding. Markedly distended urinary bladder. Stomach/Bowel: The stomach is nonenlarged. No dilated small bowel. Negative appendix. No acute bowel wall thickening. Vascular/Lymphatic: Moderate aortic atherosclerosis. No aneurysm. No suspicious nodes. Reproductive: Uterus is unremarkable.  No adnexal mass. Other: Negative for free air or free fluid. Musculoskeletal: Moderate superior endplate compression deformity at L1 with about 40% loss of height of anterior vertebral body. Degenerative changes most advanced at L3-L4 and L5-S1. IMPRESSION: 1. No CT evidence for acute intra-abdominal or pelvic abnormality. 2. Mild distal esophageal thickening, question esophagitis or reflux. 3. Small bilateral pleural effusions. Cardiomegaly. 4. Moderate superior endplate compression deformity at L1, of uncertain age, new since 2019. Aortic Atherosclerosis (ICD10-I70.0). Electronically Signed   By: Donavan Foil M.D.   On: 10/29/2020 18:57   DG CHEST PORT 1 VIEW  Result Date: 11/02/2020 CLINICAL DATA:  Chest pain when swallowing water EXAM: PORTABLE CHEST 1 VIEW COMPARISON:  10/29/2020 FINDINGS: Cardiomegaly and aortic tortuosity. Stable lung markings. There is no edema,  consolidation, effusion, or pneumothorax. IMPRESSION: 1. No acute finding when compared to prior.  2. Cardiomegaly. Electronically Signed   By: Monte Fantasia M.D.   On: 11/02/2020 04:36   DG Chest Port 1 View  Result Date: 10/29/2020 CLINICAL DATA:  Questionable sepsis. EXAM: PORTABLE CHEST 1 VIEW COMPARISON:  August 19, 2020. FINDINGS: Enlarged cardiac silhouette, similar to prior. No confluent consolidation. Streaky left basilar opacity. No visible pleural effusions or pneumothorax on this semi upright portable radiograph. No acute osseous abnormality. IMPRESSION: Streaky left basilar opacity, favored to reflect atelectasis. No confluent consolidation. Electronically Signed   By: Margaretha Sheffield MD   On: 10/29/2020 15:04     CBC Recent Labs  Lab 11/09/20 1034 11/11/20 0615 11/11/20 1133 11/11/20 1908 11/12/20 0631  WBC 7.3 6.4 7.0 6.4 5.7  HGB 11.2* 10.6* 9.9* 10.4* 9.5*  HCT 36.2 34.3* 32.9* 33.4* 31.2*  PLT 255 238 233 252 241  MCV 94.5 93.5 93.7 93.0 94.0  MCH 29.2 28.9 28.2 29.0 28.6  MCHC 30.9 30.9 30.1 31.1 30.4  RDW 13.9 13.5 13.4 13.6 13.6    Chemistries  Recent Labs  Lab 11/09/20 1034 11/12/20 0631  NA 141 143  K 4.2 3.8  CL 111 110  CO2 23 27  GLUCOSE 238* 160*  BUN 9 18  CREATININE 1.36* 1.21*  CALCIUM 8.1* 8.4*  AST 38 10*  ALT 41 18  ALKPHOS 83 60  BILITOT 0.4 0.3   ------------------------------------------------------------------------------------------------------------------ No results for input(s): CHOL, HDL, LDLCALC, TRIG, CHOLHDL, LDLDIRECT in the last 72 hours.  Lab Results  Component Value Date   HGBA1C 13.0 (H) 10/29/2020   ------------------------------------------------------------------------------------------------------------------ No results for input(s): TSH, T4TOTAL, T3FREE, THYROIDAB in the last 72 hours.  Invalid input(s):  FREET3 ------------------------------------------------------------------------------------------------------------------ No results for input(s): VITAMINB12, FOLATE, FERRITIN, TIBC, IRON, RETICCTPCT in the last 72 hours.  Coagulation profile No results for input(s): INR, PROTIME in the last 168 hours.  No results for input(s): DDIMER in the last 72 hours.  Cardiac Enzymes No results for input(s): CKMB, TROPONINI, MYOGLOBIN in the last 168 hours.  Invalid input(s): CK ------------------------------------------------------------------------------------------------------------------    Component Value Date/Time   BNP 835.0 (H) 06/22/2017 7416   Roxan Hockey M.D on 11/12/2020 at 1:10 PM  Go to [email or web address] - for contact info  [facility] - Office  [phone]

## 2020-11-13 DIAGNOSIS — K922 Gastrointestinal hemorrhage, unspecified: Secondary | ICD-10-CM

## 2020-11-13 LAB — GLUCOSE, CAPILLARY
Glucose-Capillary: 208 mg/dL — ABNORMAL HIGH (ref 70–99)
Glucose-Capillary: 210 mg/dL — ABNORMAL HIGH (ref 70–99)
Glucose-Capillary: 179 mg/dL — ABNORMAL HIGH (ref 70–99)

## 2020-11-13 MED ORDER — PANTOPRAZOLE SODIUM 40 MG PO TBEC
40.0000 mg | DELAYED_RELEASE_TABLET | Freq: Two times a day (BID) | ORAL | Status: DC
  Administered 2020-11-13 – 2020-11-14 (×2): 40 mg via ORAL
  Filled 2020-11-13 (×2): qty 1

## 2020-11-13 NOTE — TOC Progression Note (Signed)
Transition of Care [facility]) - Progression Note    Patient Details  Name: [name] MRN: [id number] Date of Birth: 08/10/1966  Transition of [facility] CM/SW Contact  Shade Flood, LCSW Phone Number: 11/13/2020, 1:56 PM  Clinical Narrative:     TOC following. Assisted [name] from [place] with having patient sign admission paperwork that needed to be signed before [name] would admit patient. Per MD, pt will be stable for dc tomorrow. Per [name], they will have a bed for patient tomorrow. Will follow up in AM.  Expected Discharge Plan: [place] Barriers to Discharge: Continued Medical Work up  Expected Discharge Plan and Services Expected Discharge Plan: [place] In-house Referral: Clinical Social Work   Post Acute Care Choice: [place] Living arrangements for the past 2 months: Single Family Home Expected Discharge Date: 11/08/20                                     Social Determinants of Health (SDOH) Interventions    Readmission Risk Interventions Readmission Risk Prevention Plan 10/31/2020 08/27/2020  Transportation Screening Complete Complete  Medication Review Press photographer) Complete Complete  PCP or Specialist appointment within 3-5 days of discharge - Complete  HRI or [place] - Complete  SW Recovery Care/Counseling Consult Complete Complete  Palliative Care Screening Not Applicable Not Applicable  Skilled Nursing Facility Complete Complete  Some recent data might be hidden

## 2020-11-13 NOTE — Progress Notes (Signed)
Patient Demographics:    [name], is a 55 y.o. female, DOB - [birth year], OQH:[id number]  Admit date - 10/29/2020   Admitting Physician Roney Jaffe, MD  Outpatient Primary MD for the patient is [name], MD  LOS - 15   Chief Complaint  Patient presents with  . Hypotension        Subjective:    [name] today has no fevers, no emesis,  No chest pain,   No further vomiting, -Not very cooperative today -Plavix and Eliquis has been on hold since 11/10/2020  Assessment  & Plan :    Principal Problem:   DKA (diabetic ketoacidosis) ([place]) Active Problems:   Essential hypertension   Chronic pain syndrome   Chronic combined systolic and diastolic CHF (congestive heart failure) (HCC)   COPD (chronic obstructive pulmonary disease) (HCC)   AKI (acute kidney injury) (HCC)   PAF (paroxysmal atrial fibrillation) (HCC)   Atrial fibrillation with RVR (HCC)   Seizures (HCC)   Shock ([place])   UGI bleed   Brief Admission History:  55 y.o.year-old w/ hx of CVA, seizures, obesity, tobacco abuse, CM EF 25%, HL, hx GIB, HTN, diabetes on insulin, Charcot foot, COPD, cocaine abuse, CKD 3, chronic syst/ diast CHF, anemia, etoh use presented to ED by EMS w/ c/o laying in bed for 2 days w/o medications. She had reportedly been vomiting and having diarrhea and has not been able to keep down her medication. EMS reported pt's boyfriend left 2 days ago and did not return, we has helping to take care of her and give her her medications. EMS BP 80/60, temp 94.5, HR 160 and CBG 414. In ED RR 31, temp 94.8, HR 145 - 162, RA 97% sat, BP 70/30 >>150/70 after cardioversion. BS >600, Na 133 CO2 17 AG 25 Glu 874 BUN 61 Cr 2.47 Ca 8.8 Alb 3.2 LFT's okay, egfr 22 WBC 17K Hb 16.6 plt 327 Lactic 2.3 >1.9 Beta-hydroxy ^ 7.16, tsh 0.5 CXR showed LLL atelectasis, noconsolidation.EKG showed wide-complex  tachycardia at 181 bpm afib w/ RVR, QRS 125 msec. Repeat ekg after cardioversion x 2 (120J, 150J) showed sinus tach at 104 bpm. In ED she was cardioverted as above and started on IV insulin drip and received IV vanc 1500 mg x 1, IV zosyn also ordered. Got 4 L total bolus IVF"s. Blood and urine cx's sent. Asked to see for admission.   Pt seen in room, lives alone, uses a walker or wheelchair mostly, needs help caring for herself. Denies any recent CP or abd pain, +n/v and diarrhea, no fevers or chills. Vague historian.   Assessment & Plan:   Principal Problem:   DKA (diabetic ketoacidosis) ([place]) Active Problems:   Essential hypertension   Chronic pain syndrome   Chronic combined systolic and diastolic CHF (congestive heart failure) (HCC)   COPD (chronic obstructive pulmonary disease) (HCC)   AKI (acute kidney injury) (HCC)   Atrial fibrillation with RVR (HCC)   Seizures (HCC)   Shock ([place])  A/p 1)Atrial fibrillation with RVR - pt s/p 2 cardioversions performed in ED by ED staff.-Remains in sinus rhythm, continue metoprolol for rate control  --Plavix and Eliquis has been on hold since 11/10/2020 -As per GI service okay to restart  Eliquis on 11/15/2020 if no further bleeding -Cardiology consult appreciated, recommended stopping clopidogrel  2)Acute Gi Bleed:- pt with hematemesis and Melena--- had EGD on 11/12/2020 - LA Grade C esophagitis with no bleeding,  Normal stomach and Non-bleeding duodenal ulcers with a clean ulcer base ([place] Class III)-- Continue to Hold Eliquis until 11/15/20 -C/n Protonix 40 mg bid  3)DKA--on admission, resolved, off IV insulin, continue subcu scheduled Lantus insulin along with NovoLog sliding scale  4)generalized weakness and debilitation -PT recommended SNF placement.,Unsteady gait, high fall risk, pt agres to  SNF Rehabtransfer  5)Chronic combined sys/diastolic heart failure - stable,  Restart Lasix  =-Echo with EF of 50 to  55% -Continue Toprol-XL  6)Epilepsy -stable, continue Keppra  7)COPD - stable,no acute exacerbation at this time, no need for steroids,continue bronchodilators  8)Depression -not very cooperative today, patient having mood swings -continue Cymbalta  9)Acute on Chronic Anemia-- -Hemoglobin down to 9.5 , baseline around 11 -Last dose of Plavix and Eliquis was 11/10/2020 - monitor and transfuse as clinically indicated -Patient refused blood draw on 11/13/2020  10)C diff infection with diarrhea --overall much improved,  Completed oral Vanco on 11/09/20  DVT prophylaxis: apixaban -on hold okay to use SCDs Code Status: Full  Family Communication: none present  Disposition:  SNF placement  (awaiting isolation bed at SNF) -Disposition/Need for in-Hospital Stay- patient unable to be discharged at this time due to awaiting SNF bed transfer   Status is: Inpatient  Remains inpatient appropriate because:Please see above  Disposition: The patient is from: Home              Anticipated d/c is to: SNF              Anticipated d/c date is: 2 days  Consultants:   GI and cardiology  Procedures:  EGD 1/18/20220  Antimicrobials:  Vancomycin 1/6>>    Lab Results  Component Value Date   PLT 241 11/12/2020    Inpatient Medications  Scheduled Meds: . amLODipine  10 mg Oral Daily  . calcium carbonate  1 tablet Oral BID  . DULoxetine  60 mg Oral Daily  . gabapentin  300 mg Oral TID  . guaiFENesin  1,200 mg Oral BID  . insulin aspart  0-15 Units Subcutaneous TID WC  . insulin aspart  0-5 Units Subcutaneous QHS  . insulin aspart  3 Units Subcutaneous TID WC  . insulin glargine  9 Units Subcutaneous Q24H  . levETIRAcetam  750 mg Oral BID  . metoprolol succinate  50 mg Oral BID  . montelukast  10 mg Oral QHS  . pantoprazole  40 mg Oral BID  . rosuvastatin  10 mg Oral Daily  . saccharomyces boulardii  250 mg Oral BID   Continuous Infusions: PRN Meds:.acetaminophen,  albuterol, alum & mag hydroxide-simeth, hydrALAZINE, ondansetron (ZOFRAN) IV, umeclidinium bromide    Anti-infectives (From admission, onward)   Start     Dose/Rate Route Frequency Ordered Stop   11/07/20 0000  vancomycin (VANCOCIN) 50 mg/mL oral solution        125 mg Oral 4 times daily 11/07/20 1047 11/12/20 2359   11/04/20 0000  vancomycin (VANCOCIN) 50 mg/mL oral solution  Status:  Discontinued        125 mg Oral 4 times daily 11/04/20 1201 11/07/20    11/03/20 1030  fluconazole (DIFLUCAN) tablet 150 mg        150 mg Oral  Once 11/03/20 0939 11/03/20 1320   10/31/20 1700  vancomycin (VANCOCIN) IVPB 1000  mg/200 mL premix  Status:  Discontinued        1,000 mg 200 mL/hr over 60 Minutes Intravenous Every 48 hours 10/29/20 1640 10/30/20 0828   10/30/20 1530  vancomycin (VANCOCIN) 50 mg/mL oral solution 125 mg        125 mg Oral 4 times daily 10/30/20 1434 11/09/20 1359   10/30/20 0200  piperacillin-tazobactam (ZOSYN) IVPB 3.375 g  Status:  Discontinued        3.375 g 12.5 mL/hr over 240 Minutes Intravenous Every 8 hours 10/29/20 1650 10/30/20 1602   10/29/20 1945  piperacillin-tazobactam (ZOSYN) IVPB 3.375 g  Status:  Discontinued        3.375 g 100 mL/hr over 30 Minutes Intravenous  Once 10/29/20 1936 10/29/20 1941   10/29/20 1700  piperacillin-tazobactam (ZOSYN) IVPB 3.375 g        3.375 g 100 mL/hr over 30 Minutes Intravenous  Once 10/29/20 1648 10/29/20 2007   10/29/20 1645  vancomycin (VANCOREADY) IVPB 1500 mg/300 mL        1,500 mg 150 mL/hr over 120 Minutes Intravenous  Once 10/29/20 1639 10/29/20 1950   10/29/20 1630  vancomycin (VANCOCIN) IVPB 1000 mg/200 mL premix  Status:  Discontinued        1,000 mg 200 mL/hr over 60 Minutes Intravenous  Once 10/29/20 1621 10/29/20 1639        Objective:   Vitals:   11/12/20 2159 11/13/20 0505 11/13/20 1100 11/13/20 1416  BP: (!) 119/91 107/89 107/89 (!) 109/95  Pulse: 85 79 78 88  Resp: 16 18    Temp: 98.6 F (37 C) 98.5 F  (36.9 C)  99.1 F (37.3 C)  TempSrc: Oral Oral  Oral  SpO2: 97% 98%  100%  Weight:      Height:        Wt Readings from Last 3 Encounters:  10/29/20 78.9 kg  10/09/20 79.4 kg  10/01/20 78.9 kg     Intake/Output Summary (Last 24 hours) at 11/13/2020 1623 Last data filed at 11/13/2020 1500 Gross per 24 hour  Intake 120 ml  Output --  Net 120 ml   Physical Exam  Gen:- Awake Alert,  In no apparent distress  HEENT:- [place].AT, No sclera icterus Neck-Supple Neck,No JVD,.  Lungs-  CTAB , fair symmetrical air movement CV- S1, S2 normal, regular  Abd-  +ve B.Sounds, Abd Soft, epigastric discomfort without rebound or guarding,    Extremity/Skin:- No  edema, pedal pulses present  Psych-affect is appropriate, oriented x3 Neuro-generalized weakness, no new focal deficits, no tremors   Data Review:   Micro Results Recent Results (from the past 240 hour(s))  Resp Panel by RT-PCR (Flu A&B, Covid) Nasopharyngeal Swab     Status: None   Collection Time: 11/04/20 11:12 AM   Specimen: Nasopharyngeal Swab; Nasopharyngeal(NP) swabs in vial transport medium  Result Value Ref Range Status   SARS Coronavirus 2 by RT PCR NEGATIVE NEGATIVE Final    Comment: (NOTE) SARS-CoV-2 target nucleic acids are NOT DETECTED.  The SARS-CoV-2 RNA is generally detectable in upper respiratory specimens during the acute phase of infection. The lowest concentration of SARS-CoV-2 viral copies this assay can detect is 138 copies/mL. A negative result does not preclude SARS-Cov-2 infection and should not be used as the sole basis for treatment or other patient management decisions. A negative result may occur with  improper specimen collection/handling, submission of specimen other than nasopharyngeal swab, presence of viral mutation(s) within the areas targeted by this assay, and  inadequate number of viral copies(<138 copies/mL). A negative result must be combined with clinical observations, patient history, and  epidemiological information. The expected result is Negative.  Fact Sheet for Patients:  EntrepreneurPulse.com.au  Fact Sheet for Healthcare Providers:  IncredibleEmployment.be  This test is no t yet approved or cleared by the [place] FDA and  has been authorized for detection and/or diagnosis of SARS-CoV-2 by FDA under an Emergency Use Authorization (EUA). This EUA will remain  in effect (meaning this test can be used) for the duration of the COVID-19 declaration under Section 564(b)(1) of the Act, 21 U.S.C.section 360bbb-3(b)(1), unless the authorization is terminated  or revoked sooner.       Influenza A by PCR NEGATIVE NEGATIVE Final   Influenza B by PCR NEGATIVE NEGATIVE Final    Comment: (NOTE) The Xpert Xpress SARS-CoV-2/FLU/RSV plus assay is intended as an aid in the diagnosis of influenza from Nasopharyngeal swab specimens and should not be used as a sole basis for treatment. Nasal washings and aspirates are unacceptable for Xpert Xpress SARS-CoV-2/FLU/RSV testing.  Fact Sheet for Patients: EntrepreneurPulse.com.au  Fact Sheet for Healthcare Providers: IncredibleEmployment.be  This test is not yet approved or cleared by the [place] FDA and has been authorized for detection and/or diagnosis of SARS-CoV-2 by FDA under an Emergency Use Authorization (EUA). This EUA will remain in effect (meaning this test can be used) for the duration of the COVID-19 declaration under Section 564(b)(1) of the Act, 21 U.S.C. section 360bbb-3(b)(1), unless the authorization is terminated or revoked.  Performed at [facility], [address], [place], [place] [address]     Radiology Reports CT ABDOMEN PELVIS WO CONTRAST  Result Date: 10/29/2020 CLINICAL DATA:  Nausea vomiting EXAM: CT ABDOMEN AND PELVIS WITHOUT CONTRAST TECHNIQUE: Multidetector CT imaging of the abdomen and pelvis was performed  following the standard protocol without IV contrast. COMPARISON:  Radiograph 08/21/2020, CT 07/12/2018 FINDINGS: Lower chest: Lung bases demonstrate small bilateral pleural effusions. Hazy posterior lung bases likely atelectasis. Cardiomegaly. Mild circumferential distal esophageal thickening. Hepatobiliary: No focal liver abnormality is seen. No gallstones, gallbladder wall thickening, or biliary dilatation. Pancreas: Unremarkable. No pancreatic ductal dilatation or surrounding inflammatory changes. Spleen: Normal in size without focal abnormality. Adrenals/Urinary Tract: Adrenal glands are within normal limits. Kidneys show no hydronephrosis. Nonspecific perinephric fat stranding. Markedly distended urinary bladder. Stomach/Bowel: The stomach is nonenlarged. No dilated small bowel. Negative appendix. No acute bowel wall thickening. Vascular/Lymphatic: Moderate aortic atherosclerosis. No aneurysm. No suspicious nodes. Reproductive: Uterus is unremarkable.  No adnexal mass. Other: Negative for free air or free fluid. Musculoskeletal: Moderate superior endplate compression deformity at L1 with about 40% loss of height of anterior vertebral body. Degenerative changes most advanced at L3-L4 and L5-S1. IMPRESSION: 1. No CT evidence for acute intra-abdominal or pelvic abnormality. 2. Mild distal esophageal thickening, question esophagitis or reflux. 3. Small bilateral pleural effusions. Cardiomegaly. 4. Moderate superior endplate compression deformity at L1, of uncertain age, new since 2019. Aortic Atherosclerosis (ICD10-I70.0). Electronically Signed   By: Donavan Foil M.D.   On: 10/29/2020 18:57   DG CHEST PORT 1 VIEW  Result Date: 11/02/2020 CLINICAL DATA:  Chest pain when swallowing water EXAM: PORTABLE CHEST 1 VIEW COMPARISON:  10/29/2020 FINDINGS: Cardiomegaly and aortic tortuosity. Stable lung markings. There is no edema, consolidation, effusion, or pneumothorax. IMPRESSION: 1. No acute finding when compared to  prior. 2. Cardiomegaly. Electronically Signed   By: Monte Fantasia M.D.   On: 11/02/2020 04:36   DG Chest Port 1  View  Result Date: 10/29/2020 CLINICAL DATA:  Questionable sepsis. EXAM: PORTABLE CHEST 1 VIEW COMPARISON:  August 19, 2020. FINDINGS: Enlarged cardiac silhouette, similar to prior. No confluent consolidation. Streaky left basilar opacity. No visible pleural effusions or pneumothorax on this semi upright portable radiograph. No acute osseous abnormality. IMPRESSION: Streaky left basilar opacity, favored to reflect atelectasis. No confluent consolidation. Electronically Signed   By: Margaretha Sheffield MD   On: 10/29/2020 15:04     CBC Recent Labs  Lab 11/09/20 1034 11/11/20 0615 11/11/20 1133 11/11/20 1908 11/12/20 0631  WBC 7.3 6.4 7.0 6.4 5.7  HGB 11.2* 10.6* 9.9* 10.4* 9.5*  HCT 36.2 34.3* 32.9* 33.4* 31.2*  PLT 255 238 233 252 241  MCV 94.5 93.5 93.7 93.0 94.0  MCH 29.2 28.9 28.2 29.0 28.6  MCHC 30.9 30.9 30.1 31.1 30.4  RDW 13.9 13.5 13.4 13.6 13.6    Chemistries  Recent Labs  Lab 11/09/20 1034 11/12/20 0631  NA 141 143  K 4.2 3.8  CL 111 110  CO2 23 27  GLUCOSE 238* 160*  BUN 9 18  CREATININE 1.36* 1.21*  CALCIUM 8.1* 8.4*  AST 38 10*  ALT 41 18  ALKPHOS 83 60  BILITOT 0.4 0.3   ------------------------------------------------------------------------------------------------------------------ No results for input(s): CHOL, HDL, LDLCALC, TRIG, CHOLHDL, LDLDIRECT in the last 72 hours.  Lab Results  Component Value Date   HGBA1C 13.0 (H) 10/29/2020   ------------------------------------------------------------------------------------------------------------------ No results for input(s): TSH, T4TOTAL, T3FREE, THYROIDAB in the last 72 hours.  Invalid input(s): FREET3 ------------------------------------------------------------------------------------------------------------------ No results for input(s): VITAMINB12, FOLATE, FERRITIN, TIBC, IRON,  RETICCTPCT in the last 72 hours.  Coagulation profile No results for input(s): INR, PROTIME in the last 168 hours.  No results for input(s): DDIMER in the last 72 hours.  Cardiac Enzymes No results for input(s): CKMB, TROPONINI, MYOGLOBIN in the last 168 hours.  Invalid input(s): CK ------------------------------------------------------------------------------------------------------------------    Component Value Date/Time   BNP 835.0 (H) 06/22/2017 8682   Roxan Hockey M.D on 11/13/2020 at 4:23 PM  Go to [email or web address] - for contact info  [facility] - Office  [phone]

## 2020-11-13 NOTE — Progress Notes (Addendum)
Subjective:  Patient denies N/V. I asked if her diarrhea resolved and she responded she didn't know. She could not tell me when her last stool was. She was found laying completely under the covers. She refused to pull covers down to allow me to exam her. She denies abdominal pain.  Objective: Vital signs in last 24 hours: Temp:  [98.4 F (36.9 C)-99.3 F (37.4 C)] 98.5 F (36.9 C) (01/19 0505) Pulse Rate:  [77-87] 79 (01/19 0505) Resp:  [16-22] 18 (01/19 0505) BP: (98-144)/(75-113) 107/89 (01/19 0505) SpO2:  [94 %-100 %] 98 % (01/19 0505) Last BM Date: 11/12/20 General:   Alert, uncooperative. Patient refused exam. She did not appear to be in any distress.   Psych:  Alert and uncooperative. flat affect.  Intake/Output from previous day: 01/18 0701 - 01/19 0700 In: 300 [I.V.:300] Out: 500 [Urine:500] Intake/Output this shift: No intake/output data recorded.  Lab Results: CBC Recent Labs    11/11/20 1133 11/11/20 1908 11/12/20 0631  WBC 7.0 6.4 5.7  HGB 9.9* 10.4* 9.5*  HCT 32.9* 33.4* 31.2*  MCV 93.7 93.0 94.0  PLT 233 252 241   BMET Recent Labs    11/12/20 0631  NA 143  K 3.8  CL 110  CO2 27  GLUCOSE 160*  BUN 18  CREATININE 1.21*  CALCIUM 8.4*   LFTs Recent Labs    11/12/20 0631  BILITOT 0.3  ALKPHOS 60  AST 10*  ALT 18  PROT 4.8*  ALBUMIN 1.9*   No results for input(s): LIPASE in the last 72 hours. PT/INR No results for input(s): LABPROT, INR in the last 72 hours.    Imaging Studies: CT ABDOMEN PELVIS WO CONTRAST  Result Date: 10/29/2020 CLINICAL DATA:  Nausea vomiting EXAM: CT ABDOMEN AND PELVIS WITHOUT CONTRAST TECHNIQUE: Multidetector CT imaging of the abdomen and pelvis was performed following the standard protocol without IV contrast. COMPARISON:  Radiograph 08/21/2020, CT 07/12/2018 FINDINGS: Lower chest: Lung bases demonstrate small bilateral pleural effusions. Hazy posterior lung bases likely atelectasis. Cardiomegaly. Mild circumferential  distal esophageal thickening. Hepatobiliary: No focal liver abnormality is seen. No gallstones, gallbladder wall thickening, or biliary dilatation. Pancreas: Unremarkable. No pancreatic ductal dilatation or surrounding inflammatory changes. Spleen: Normal in size without focal abnormality. Adrenals/Urinary Tract: Adrenal glands are within normal limits. Kidneys show no hydronephrosis. Nonspecific perinephric fat stranding. Markedly distended urinary bladder. Stomach/Bowel: The stomach is nonenlarged. No dilated small bowel. Negative appendix. No acute bowel wall thickening. Vascular/Lymphatic: Moderate aortic atherosclerosis. No aneurysm. No suspicious nodes. Reproductive: Uterus is unremarkable.  No adnexal mass. Other: Negative for free air or free fluid. Musculoskeletal: Moderate superior endplate compression deformity at L1 with about 40% loss of height of anterior vertebral body. Degenerative changes most advanced at L3-L4 and L5-S1. IMPRESSION: 1. No CT evidence for acute intra-abdominal or pelvic abnormality. 2. Mild distal esophageal thickening, question esophagitis or reflux. 3. Small bilateral pleural effusions. Cardiomegaly. 4. Moderate superior endplate compression deformity at L1, of uncertain age, new since 2019. Aortic Atherosclerosis (ICD10-I70.0). Electronically Signed   By: Donavan Foil M.D.   On: 10/29/2020 18:57   DG CHEST PORT 1 VIEW  Result Date: 11/02/2020 CLINICAL DATA:  Chest pain when swallowing water EXAM: PORTABLE CHEST 1 VIEW COMPARISON:  10/29/2020 FINDINGS: Cardiomegaly and aortic tortuosity. Stable lung markings. There is no edema, consolidation, effusion, or pneumothorax. IMPRESSION: 1. No acute finding when compared to prior. 2. Cardiomegaly. Electronically Signed   By: Monte Fantasia M.D.   On: 11/02/2020 04:36   DG  Chest Port 1 View  Result Date: 10/29/2020 CLINICAL DATA:  Questionable sepsis. EXAM: PORTABLE CHEST 1 VIEW COMPARISON:  August 19, 2020. FINDINGS: Enlarged  cardiac silhouette, similar to prior. No confluent consolidation. Streaky left basilar opacity. No visible pleural effusions or pneumothorax on this semi upright portable radiograph. No acute osseous abnormality. IMPRESSION: Streaky left basilar opacity, favored to reflect atelectasis. No confluent consolidation. Electronically Signed   By: Margaretha Sheffield MD   On: 10/29/2020 15:04  [2 weeks]   Assessment: 55 year old female with past medical history of alcohol use, CKD, cocaine abuse, COPD, diabetes, hypertension, obesity, A. fib, seizures, stroke presented to the emergency department  10/29/20 with complaints of N/V/D and going two days without medications. she was found with DKA, afib with RVR requiring emergent cardioversion X 2 in ED, hypotension with shock, AKI on CKD. She was found to have C. difficile infection.  These medical issues have been addressed.  GI was consulted for recurrent episodes of hematemesis.   Hematemesis: Chronically taking NSAIDs (Advil PM), at least once daily.  Baseline hemoglobin appears to be in the 10 range.  Hemoglobin 10.4 on November 3.  On presentation on January 4 her hemoglobin was 16.6 (in setting of dehydration), January 15 hemoglobin 11.2, January 17 hemoglobin 9.9, January 18 hemoglobin 9.5. No hematemesis since 11/10/20. She has refused blood draws today. EGD January 18th for hematemesis showed LA grade C esophagitis nonbleeding, at least 6 nonbleeding superficial duodenal ulcers with clean base and duodenitis, normal stomach status post biopsies for H. pylori.  Diarrhea: Completed 10-day course of vancomycin on 11/09/20. Continue Florastor for postinfectious diarrhea.  If persistent diarrhea, consider treatment failure.   Plan: 1. Pantoprazole 40 mg twice daily for 3 months, then once daily indefinitely. 2. Follow-up pathology is available. 3. No NSAIDs. 4. Discussed with Dr. Laural Golden, can resume Eliquis 11/15/20 if does not display signs of ongoing  bleeding. 5. Will sign off, call with any questions/concerns.   Laureen Ochs. Bernarda Caffey [facility] [phone] 1/19/202210:22 AM     LOS: 15 days

## 2020-11-13 NOTE — Progress Notes (Signed)
.   Progress Note  Patient Name: [name] Date of Encounter: 11/13/2020   Chart reviewed.  Patient tolerated EGD on January 18 without cardiac event.  Found to have LA Grade C esophagitis with no bleeding, normal stomach and non-bleeding duodenal ulcers with a clean ulcer base (Forrest Class III).  Inpatient Medications    Scheduled Meds: . amLODipine  10 mg Oral Daily  . calcium carbonate  1 tablet Oral BID  . DULoxetine  60 mg Oral Daily  . gabapentin  300 mg Oral TID  . guaiFENesin  1,200 mg Oral BID  . insulin aspart  0-15 Units Subcutaneous TID WC  . insulin aspart  0-5 Units Subcutaneous QHS  . insulin aspart  3 Units Subcutaneous TID WC  . insulin glargine  9 Units Subcutaneous Q24H  . levETIRAcetam  750 mg Oral BID  . metoprolol succinate  50 mg Oral BID  . montelukast  10 mg Oral QHS  . pantoprazole (PROTONIX) IV  40 mg Intravenous Q12H  . rosuvastatin  10 mg Oral Daily  . saccharomyces boulardii  250 mg Oral BID    PRN Meds: acetaminophen, albuterol, alum & mag hydroxide-simeth, hydrALAZINE, ondansetron (ZOFRAN) IV, umeclidinium bromide   Vital Signs    Vitals:   11/12/20 1300 11/12/20 1528 11/12/20 2159 11/13/20 0505  BP:  (!) 129/113 (!) 119/91 107/89  Pulse:  87 85 79  Resp: 20  16 18   Temp:  99.3 F (37.4 C) 98.6 F (37 C) 98.5 F (36.9 C)  TempSrc:  Oral Oral Oral  SpO2:  97% 97% 98%  Weight:      Height:        Intake/Output Summary (Last 24 hours) at 11/13/2020 0912 Last data filed at 11/12/2020 1250 Gross per 24 hour  Intake 300 ml  Output 500 ml  Net -200 ml   Filed Weights   10/29/20 1243  Weight: 78.9 kg    Telemetry    Sinus rhythm with intermittent bursts of SVT.  Personally reviewed.  Labs    Chemistry Recent Labs  Lab 11/09/20 1034 11/12/20 0631  NA 141 143  K 4.2 3.8  CL 111 110  CO2 23 27  GLUCOSE 238* 160*  BUN 9 18  CREATININE 1.36* 1.21*  CALCIUM 8.1* 8.4*  PROT 5.4* 4.8*  ALBUMIN 2.0* 1.9*  AST 38  10*  ALT 41 18  ALKPHOS 83 60  BILITOT 0.4 0.3  GFRNONAA 46* 53*  ANIONGAP 7 6     Hematology Recent Labs  Lab 11/11/20 1133 11/11/20 1908 11/12/20 0631  WBC 7.0 6.4 5.7  RBC 3.51* 3.59* 3.32*  HGB 9.9* 10.4* 9.5*  HCT 32.9* 33.4* 31.2*  MCV 93.7 93.0 94.0  MCH 28.2 29.0 28.6  MCHC 30.1 31.1 30.4  RDW 13.4 13.6 13.6  PLT 233 252 241    Cardiac Studies   Echocardiogram 08/14/2020: 1. Left ventricular ejection fraction, by estimation, is 50 to 55%. The  left ventricle has low normal function. The left ventricle has no regional  wall motion abnormalities. There is moderate left ventricular hypertrophy.  Left ventricular diastolic  parameters are indeterminate.  2. Right ventricular systolic function is normal. The right ventricular  size is normal. There is normal pulmonary artery systolic pressure. The  estimated right ventricular systolic pressure is 51.8 mmHg.  3. Left atrial size was mild to moderately dilated.  4. The mitral valve is grossly normal. Mild mitral valve regurgitation.  5. The aortic valve is tricuspid.  Aortic valve regurgitation is not  visualized.  6. The inferior vena cava is normal in size with greater than 50%  respiratory variability, suggesting right atrial pressure of 3 mmHg.      When stable from GI perspective, recommend resuming Eliquis at prior dose, but not start back on Plavix.  She remains in sinus rhythm with bursts of SVT, not atrial fibrillation.  Continues on Norvasc, Toprol-XL, and Crestor.  Last LVEF 50 to 55%.  No further inpatient cardiac evaluation is planned.  Please schedule follow-up in our [place] office 2 to 3 weeks after discharge.  We will sign off.  Signed, Rozann Lesches, MD  11/13/2020, 9:12 AM

## 2020-11-14 ENCOUNTER — Other Ambulatory Visit: Payer: Self-pay

## 2020-11-14 ENCOUNTER — Encounter (HOSPITAL_COMMUNITY): Payer: Self-pay | Admitting: Gastroenterology

## 2020-11-14 ENCOUNTER — Telehealth: Payer: Self-pay | Admitting: Gastroenterology

## 2020-11-14 DIAGNOSIS — I4891 Unspecified atrial fibrillation: Secondary | ICD-10-CM | POA: Diagnosis not present

## 2020-11-14 DIAGNOSIS — J440 Chronic obstructive pulmonary disease with acute lower respiratory infection: Secondary | ICD-10-CM | POA: Diagnosis not present

## 2020-11-14 DIAGNOSIS — R269 Unspecified abnormalities of gait and mobility: Secondary | ICD-10-CM | POA: Diagnosis not present

## 2020-11-14 DIAGNOSIS — G629 Polyneuropathy, unspecified: Secondary | ICD-10-CM | POA: Diagnosis not present

## 2020-11-14 DIAGNOSIS — E1110 Type 2 diabetes mellitus with ketoacidosis without coma: Secondary | ICD-10-CM | POA: Diagnosis not present

## 2020-11-14 DIAGNOSIS — R5381 Other malaise: Secondary | ICD-10-CM | POA: Diagnosis not present

## 2020-11-14 DIAGNOSIS — M6281 Muscle weakness (generalized): Secondary | ICD-10-CM | POA: Diagnosis not present

## 2020-11-14 DIAGNOSIS — R051 Acute cough: Secondary | ICD-10-CM | POA: Diagnosis not present

## 2020-11-14 DIAGNOSIS — E785 Hyperlipidemia, unspecified: Secondary | ICD-10-CM | POA: Diagnosis not present

## 2020-11-14 DIAGNOSIS — I480 Paroxysmal atrial fibrillation: Secondary | ICD-10-CM | POA: Diagnosis not present

## 2020-11-14 DIAGNOSIS — N179 Acute kidney failure, unspecified: Secondary | ICD-10-CM | POA: Diagnosis not present

## 2020-11-14 DIAGNOSIS — J449 Chronic obstructive pulmonary disease, unspecified: Secondary | ICD-10-CM | POA: Diagnosis not present

## 2020-11-14 DIAGNOSIS — R531 Weakness: Secondary | ICD-10-CM | POA: Diagnosis not present

## 2020-11-14 DIAGNOSIS — I69054 Hemiplegia and hemiparesis following nontraumatic subarachnoid hemorrhage affecting left non-dominant side: Secondary | ICD-10-CM | POA: Diagnosis not present

## 2020-11-14 DIAGNOSIS — E118 Type 2 diabetes mellitus with unspecified complications: Secondary | ICD-10-CM | POA: Diagnosis not present

## 2020-11-14 DIAGNOSIS — I5042 Chronic combined systolic (congestive) and diastolic (congestive) heart failure: Secondary | ICD-10-CM | POA: Diagnosis not present

## 2020-11-14 DIAGNOSIS — U071 COVID-19: Secondary | ICD-10-CM | POA: Diagnosis not present

## 2020-11-14 DIAGNOSIS — R2681 Unsteadiness on feet: Secondary | ICD-10-CM | POA: Diagnosis not present

## 2020-11-14 DIAGNOSIS — I10 Essential (primary) hypertension: Secondary | ICD-10-CM | POA: Diagnosis not present

## 2020-11-14 DIAGNOSIS — I482 Chronic atrial fibrillation, unspecified: Secondary | ICD-10-CM | POA: Diagnosis not present

## 2020-11-14 DIAGNOSIS — Z743 Need for continuous supervision: Secondary | ICD-10-CM | POA: Diagnosis not present

## 2020-11-14 DIAGNOSIS — N1832 Chronic kidney disease, stage 3b: Secondary | ICD-10-CM | POA: Diagnosis not present

## 2020-11-14 DIAGNOSIS — N183 Chronic kidney disease, stage 3 unspecified: Secondary | ICD-10-CM | POA: Diagnosis not present

## 2020-11-14 DIAGNOSIS — J441 Chronic obstructive pulmonary disease with (acute) exacerbation: Secondary | ICD-10-CM | POA: Diagnosis not present

## 2020-11-14 DIAGNOSIS — I69354 Hemiplegia and hemiparesis following cerebral infarction affecting left non-dominant side: Secondary | ICD-10-CM | POA: Diagnosis not present

## 2020-11-14 DIAGNOSIS — I639 Cerebral infarction, unspecified: Secondary | ICD-10-CM | POA: Diagnosis not present

## 2020-11-14 DIAGNOSIS — I1310 Hypertensive heart and chronic kidney disease without heart failure, with stage 1 through stage 4 chronic kidney disease, or unspecified chronic kidney disease: Secondary | ICD-10-CM | POA: Diagnosis not present

## 2020-11-14 DIAGNOSIS — G894 Chronic pain syndrome: Secondary | ICD-10-CM | POA: Diagnosis not present

## 2020-11-14 LAB — COMPREHENSIVE METABOLIC PANEL
ALT: 39 U/L (ref 0–44)
AST: 44 U/L — ABNORMAL HIGH (ref 15–41)
Albumin: 2 g/dL — ABNORMAL LOW (ref 3.5–5.0)
Alkaline Phosphatase: 68 U/L (ref 38–126)
Anion gap: 5 (ref 5–15)
BUN: 14 mg/dL (ref 6–20)
CO2: 25 mmol/L (ref 22–32)
Calcium: 7.9 mg/dL — ABNORMAL LOW (ref 8.9–10.3)
Chloride: 108 mmol/L (ref 98–111)
Creatinine, Ser: 1.55 mg/dL — ABNORMAL HIGH (ref 0.44–1.00)
GFR, Estimated: 39 mL/min — ABNORMAL LOW (ref >60)
Glucose, Bld: 266 mg/dL — ABNORMAL HIGH (ref 70–99)
Potassium: 4.2 mmol/L (ref 3.5–5.1)
Sodium: 138 mmol/L (ref 135–145)
Total Bilirubin: 0.5 mg/dL (ref 0.3–1.2)
Total Protein: 4.9 g/dL — ABNORMAL LOW (ref 6.5–8.1)

## 2020-11-14 LAB — SURGICAL PATHOLOGY

## 2020-11-14 LAB — GLUCOSE, CAPILLARY
Glucose-Capillary: 194 mg/dL — ABNORMAL HIGH (ref 70–99)
Glucose-Capillary: 162 mg/dL — ABNORMAL HIGH (ref 70–99)

## 2020-11-14 LAB — CBC
HCT: 29.8 % — ABNORMAL LOW (ref 36.0–46.0)
Hemoglobin: 9 g/dL — ABNORMAL LOW (ref 12.0–15.0)
MCH: 29 pg (ref 26.0–34.0)
MCHC: 30.2 g/dL (ref 30.0–36.0)
MCV: 96.1 fL (ref 80.0–100.0)
Platelets: 236 10*3/uL (ref 150–400)
RBC: 3.1 MIL/uL — ABNORMAL LOW (ref 3.87–5.11)
RDW: 13.7 % (ref 11.5–15.5)
WBC: 5.6 10*3/uL (ref 4.0–10.5)
nRBC: 0 % (ref 0.0–0.2)

## 2020-11-14 MED ORDER — PANTOPRAZOLE SODIUM 40 MG PO TBEC: 40.0000 mg | DELAYED_RELEASE_TABLET | Freq: Two times a day (BID) | ORAL | 5 refills | Status: DC

## 2020-11-14 MED ORDER — APIXABAN 5 MG PO TABS: 5.0000 mg | ORAL_TABLET | Freq: Two times a day (BID) | ORAL | 2 refills | Status: DC

## 2020-11-14 NOTE — Discharge Summary (Signed)
[name], is a 55 y.o. female  DOB 24-Jul-1966  MRN [id number].  Admission date:  10/29/2020  Admitting Physician  Roney Jaffe, MD  Discharge Date:  11/14/2020   Primary MD  [name], MD  Recommendations for primary care physician for things to follow:    1)You are taking Eliquis/apixaban and Plavix/clopidogrel which are blood thinners, so Please Avoid ibuprofen/Advil/Aleve/Motrin/Goody Powders/Naproxen/BC powders/Meloxicam/Diclofenac/Indomethacin and other Nonsteroidal anti-inflammatory medications as these will make you more likely to bleed and can cause stomach ulcers, can also cause Kidney problems. 2) CBC and BMP Blood Tests every Monday starting 11/18/2020 for the next 3 weeks advised 3) NovoLog sliding scale insulin as below:- insulin aspart (novoLOG) injection 0-10 Units 0-10 Units Subcutaneous, 3 times daily with meals CBG < 70: Implement Hypoglycemia Standing Orders and refer to Hypoglycemia Standing Orders sidebar report  CBG 70 - 120: 0 unit CBG 121 - 150: 0 unit  CBG 151 - 200: 1 unit CBG 201 - 250: 2 units CBG 251 - 300: 4 units CBG 301 - 350: 6 units  CBG 351 - 400: 8 units  CBG > 400: 10 units 4)Okay to restart Eliquis on 11/17/2020  Admission Diagnosis  DKA (diabetic ketoacidosis) ([place]) [E11.10] Hyperglycemia [R73.9] AKI (acute kidney injury) ([place]) [N17.9] Disoriented to time [Z78.9] Atrial fibrillation, unspecified type ([place]) [I48.91] Nausea and vomiting, intractability of vomiting not specified, unspecified vomiting type [R11.2]   Discharge Diagnosis  DKA (diabetic ketoacidosis) ([place]) [E11.10] Hyperglycemia [R73.9] AKI (acute kidney injury) ([place]) [N17.9] Disoriented to time [Z78.9] Atrial fibrillation, unspecified type ([place]) [I48.91] Nausea and vomiting, intractability of vomiting not specified, unspecified vomiting type [R11.2]    Principal Problem:   DKA (diabetic  ketoacidosis) ([place]) Active Problems:   Essential hypertension   Chronic pain syndrome   Chronic combined systolic and diastolic CHF (congestive heart failure) (HCC)   COPD (chronic obstructive pulmonary disease) (HCC)   AKI (acute kidney injury) ([place])   PAF (paroxysmal atrial fibrillation) (HCC)   Atrial fibrillation with RVR ([place])   Seizures ([place])   Shock ([place])   UGI bleed      Past Medical History:  Diagnosis Date  . Alcohol use   . Ankle fracture, lateral malleolus, closed 2013  . Anxiety   . Breast mass, left 2013  . Chronic anemia   . CKD (chronic kidney disease), stage III ([place])   . Cocaine abuse ([place])   . COPD (chronic obstructive pulmonary disease) ([place])   . Diabetes mellitus, type 2 ([place])   . Diabetic Charcot foot ([place])   . Essential hypertension   . History of cardiomyopathy   . History of GI bleed 2011  . Hyperlipidemia   . Noncompliance   . Obesity   . Panic attacks   . PAT (paroxysmal atrial tachycardia) (HCC)    Previously on Amiodarone  . Seizures ([place]) 10/01/2020  . Sleep apnea    Not on CPAP  . Stroke ([place]) 2018  . Tobacco abuse   . Urinary incontinence     Past  Surgical History:  Procedure Laterality Date  . BIOPSY  11/12/2020   Procedure: BIOPSY;  Surgeon: Harvel Quale, MD;  [facility];  Service: Gastroenterology;;  . BREAST BIOPSY    . CARDIAC CATHETERIZATION N/A 07/28/2016   Procedure: Left Heart Cath and Coronary Angiography;  Surgeon: Jettie Booze, MD;  Location: [facility];  Service: Cardiovascular;  Laterality: N/A;  . COLONOSCOPY N/A 05/10/2019   Procedure: COLONOSCOPY;  Surgeon: Danie Binder, MD;  [facility];  Service: Endoscopy;  Laterality: N/A;  Phenergan 12.5 mg IV in pre-op  . DILATION AND CURETTAGE OF UTERUS    . ESOPHAGOGASTRODUODENOSCOPY (EGD) WITH PROPOFOL N/A 11/12/2020   Procedure: ESOPHAGOGASTRODUODENOSCOPY (EGD) WITH PROPOFOL;  Surgeon: Harvel Quale, MD;   [facility];  Service: Gastroenterology;  Laterality: N/A;  . I & D EXTREMITY Bilateral 09/22/2017   Procedure: BILATERAL DEBRIDEMENT LEG/FOOT ULCERS, APPLY VERAFLO WOUND VAC;  Surgeon: Newt Minion, MD;  Location: [name];  Service: Orthopedics;  Laterality: Bilateral;  . I & D EXTREMITY Right 10/11/2018   Procedure: IRRIGATION AND DEBRIDEMENT RIGHT HAND;  Surgeon: [name], MD;  Location: [facility];  Service: Orthopedics;  Laterality: Right;  . I & D EXTREMITY Right 10/13/2018   Procedure: REPEAT IRRIGATION AND DEBRIDEMENT RIGHT HAND;  Surgeon: [name], MD;  Location: [facility];  Service: Orthopedics;  Laterality: Right;  . I & D EXTREMITY Right 11/22/2018   Procedure: IRRIGATION AND DEBRIDEMENT AND PINNING RIGHT HAND;  Surgeon: [name], MD;  Location: [facility];  Service: Orthopedics;  Laterality: Right;  . IR RADIOLOGIST EVAL & MGMT  07/05/2018  . POLYPECTOMY  05/10/2019   Procedure: POLYPECTOMY;  Surgeon: Danie Binder, MD;  [facility];  Service: Endoscopy;;  . SKIN SPLIT GRAFT Bilateral 09/28/2017   Procedure: BILATERAL SPLIT THICKNESS SKIN GRAFT LEGS/FEET AND APPLY VAC;  Surgeon: Newt Minion, MD;  Location: [facility];  Service: Orthopedics;  Laterality: Bilateral;  . SKIN SPLIT GRAFT Right 11/22/2018   Procedure: SKIN GRAFT SPLIT THICKNESS;  Surgeon: [name], MD;  Location: [facility];  Service: Orthopedics;  Laterality: Right;       HPI  from the history and physical done on the day of admission:    Chief Complaint:  HPI: The patient is a55 y.o.year-old w/ hx of CVA, seizures, obesity, tobacco abuse, CM EF 25%, HL, hx GIB, HTN, diabetes on insulin, Charcot foot, COPD, cocaine abuse, CKD 3, chronic syst/ diast CHF, anemia, etoh use presented to ED by EMS w/ c/o laying in bed for 2 days w/o medications. She had reportedly been vomiting and having diarrhea and has not been able to keep down her medication. EMS reported pt's boyfriend left 2 days ago  and did not return, we has helping to take care of her and give her her medications. EMS BP 80/60, temp 94.5, HR 160 and CBG 414. In ED RR 31, temp 94.8, HR 145 - 162, RA 97% sat, BP 70/30 >>150/70 after cardioversion. BS >600, Na 133 CO2 17 AG 25 Glu 874 BUN 61 Cr 2.47 Ca 8.8 Alb 3.2 LFT's okay, egfr 22 WBC 17K Hb 16.6 plt 327 Lactic 2.3 >1.9 Beta-hydroxy ^ 7.16, tsh 0.5 CXR showed LLL atelectasis, noconsolidation.EKG showed wide-complex tachycardia at 181 bpm afib w/ RVR, QRS 125 msec. Repeat ekg after cardioversion x 2 (120J, 150J) showed sinus tach at 104 bpm. In ED she was cardioverted as above and started on IV insulin drip  and received IV vanc 1500 mg x 1, IV zosyn also ordered. Got 4 L total bolus IVF"s. Blood and urine cx's sent. Asked to see for admission.   Pt seen in room, lives alone, uses a walker or wheelchair mostly, needs help caring for herself. Denies any recent CP or abd pain, +n/v and diarrhea, no fevers or chills. Vague historian.      Hospital Course:   Brief Admission History: 55 y.o.year-old w/ hx of CVA, seizures, obesity, tobacco abuse, CM EF 25%, HL, hx GIB, HTN, diabetes on insulin, Charcot foot, COPD, cocaine abuse, CKD 3, chronic syst/ diast CHF, anemia, etoh use presented to ED by EMS w/ c/o laying in bed for 2 days w/o medications. She had reportedly been vomiting and having diarrhea and has not been able to keep down her medication. EMS reported pt's boyfriend left 2 days ago and did not return, we has helping to take care of her and give her her medications. EMS BP 80/60, temp 94.5, HR 160 and CBG 414. In ED RR 31, temp 94.8, HR 145 - 162, RA 97% sat, BP 70/30 >>150/70 after cardioversion. BS >600, Na 133 CO2 17 AG 25 Glu 874 BUN 61 Cr 2.47 Ca 8.8 Alb 3.2 LFT's okay, egfr 22 WBC 17K Hb 16.6 plt 327 Lactic 2.3 >1.9 Beta-hydroxy ^ 7.16, tsh 0.5 CXR showed LLL atelectasis, noconsolidation.EKG showed wide-complex tachycardia at 181  bpm afib w/ RVR, QRS 125 msec. Repeat ekg after cardioversion x 2 (120J, 150J) showed sinus tach at 104 bpm. In ED she was cardioverted as above and started on IV insulin drip and received IV vanc 1500 mg x 1, IV zosyn also ordered. Got 4 L total bolus IVF"s. Blood and urine cx's sent. Asked to see for admission.   Pt seen in room, lives alone, uses a walker or wheelchair mostly, needs help caring for herself. Denies any recent CP or abd pain, +n/v and diarrhea, no fevers or chills. Vague historian.   Assessment & Plan:  Principal Problem: DKA (diabetic ketoacidosis) ([place]) Active Problems: Essential hypertension Chronic pain syndrome Chronic combined systolic and diastolic CHF (congestive heart failure) (HCC) COPD (chronic obstructive pulmonary disease) (HCC) AKI (acute kidney injury) (HCC) Atrial fibrillation with RVR (HCC) Seizures (HCC) Shock ([place])  A/p 1)Atrial fibrillation with RVR - pt s/p 2 cardioversions performed in ED by ED staff.-Remains in sinus rhythm, continue metoprololfor rate control --Plavix and Eliquis has been on hold since 11/10/2020 -As per GI service okay to restart Eliquis on 11/17/2020 if no further bleeding -Cardiology consult appreciated, recommended stopping clopidogrel especially due to GI bleed  2)Acute Gi Bleed:- pt with hematemesis and Melena--- had EGD on 11/12/2020- LA Grade C esophagitis with no bleeding,  Normal stomach and Non-bleeding duodenal ulcers with a clean ulcer base ([place] Class III)-- Continue to Hold Eliquis until 11/17/20 -C/n Protonix 40 mg bid  3)DKA--on admission, resolved, off IV insulin, continue subcuscheduled Lantus insulin along with NovoLog sliding scale  4)generalized weakness and debilitation -PT recommended SNF placement.,Unsteady gait, high fall risk,pt agres toSNF Rehabtransfer  5)Chronic combined sys/diastolic heart failure - stable, Restart Lasix  =-Echo with EF of 50 to  55% -Continue Toprol-XL  6)Epilepsy -stable,continue Keppra  7)COPD - stable,no acute exacerbation at this time, no need for steroids,continue bronchodilators  8)Depression -stable-continue Cymbalta  9)Acute on Chronic Anemia-- -Hemoglobin down to 9 , baseline around 11 -Last dose of Plavix and Eliquis was 11/10/2020 -CBC every Monday starting 11/19/2019  10)C diff infection with diarrhea --no  further diarrhea, Completed oral Vanco on 11/09/20  Code Status:Full  Family Communication:none present  Disposition:SNF placement    Disposition: The patient is from: Home  Anticipated d/c is to: SNF  Anticipated d/c date is: 2 days  Consultants:  GI and cardiology  Procedures: EGD 1/18/20220  Antimicrobials:  Vancomycin 1/6>>   Discharge Condition: Stable  Follow UP   Contact information for after-discharge care    [facility] .   Service: Skilled Nursing Contact information: [address] [place] [phone]                   Consults obtained -   Diet and Activity recommendation:  As advised  Discharge Instructions    Discharge Instructions    Call MD for:  difficulty breathing, headache or visual disturbances   Complete by: As directed    Call MD for:  difficulty breathing, headache or visual disturbances   Complete by: As directed    Call MD for:  persistant dizziness or light-headedness   Complete by: As directed    Call MD for:  persistant dizziness or light-headedness   Complete by: As directed    Call MD for:  persistant nausea and vomiting   Complete by: As directed    Call MD for:  persistant nausea and vomiting   Complete by: As directed    Call MD for:  temperature >100.4   Complete by: As directed    Call MD for:  temperature >100.4   Complete by: As directed    Diet - low sodium heart healthy   Complete by: As  directed    Diet - low sodium heart healthy   Complete by: As directed    Diet Carb Modified   Complete by: As directed    Diet Carb Modified   Complete by: As directed    Discharge instructions   Complete by: As directed    1)You are taking Eliquis/apixaban and Plavix/clopidogrel which are blood thinners, so Please Avoid ibuprofen/Advil/Aleve/Motrin/Goody Powders/Naproxen/BC powders/Meloxicam/Diclofenac/Indomethacin and other Nonsteroidal anti-inflammatory medications as these will make you more likely to bleed and can cause stomach ulcers, can also cause Kidney problems. 2) CBC and BMP Blood Tests every Monday starting 11/18/2020 for the next 3 weeks advised 3) NovoLog sliding scale insulin as below:- insulin aspart (novoLOG) injection 0-10 Units 0-10 Units Subcutaneous, 3 times daily with meals CBG < 70: Implement Hypoglycemia Standing Orders and refer to Hypoglycemia Standing Orders sidebar report  CBG 70 - 120: 0 unit CBG 121 - 150: 0 unit  CBG 151 - 200: 1 unit CBG 201 - 250: 2 units CBG 251 - 300: 4 units CBG 301 - 350: 6 units  CBG 351 - 400: 8 units  CBG > 400: 10 units 4)Okay to restart Eliquis on 11/17/2020   Discharge instructions   Complete by: As directed    1)You are taking Eliquis/apixaban and Plavix/clopidogrel which are blood thinners, so Please Avoid ibuprofen/Advil/Aleve/Motrin/Goody Powders/Naproxen/BC powders/Meloxicam/Diclofenac/Indomethacin and other Nonsteroidal anti-inflammatory medications as these will make you more likely to bleed and can cause stomach ulcers, can also cause Kidney problems. 2) CBC and BMP Blood Tests every Monday starting 11/18/2020 for the next 3 weeks advised 3) NovoLog sliding scale insulin as below:- insulin aspart (novoLOG) injection 0-10 Units 0-10 Units Subcutaneous, 3 times daily with meals CBG < 70: Implement Hypoglycemia Standing Orders and refer to Hypoglycemia Standing Orders sidebar report  CBG 70 - 120: 0  unit CBG 121 - 150: 0 unit  CBG 151  - 200: 1 unit CBG 201 - 250: 2 units CBG 251 - 300: 4 units CBG 301 - 350: 6 units  CBG 351 - 400: 8 units  CBG > 400: 10 units 4)Okay to restart Eliquis on 11/17/2020   Increase activity slowly   Complete by: As directed    Increase activity slowly   Complete by: As directed         Discharge Medications     Allergies as of 11/14/2020   No Known Allergies     Medication List    STOP taking these medications   clopidogrel 75 MG tablet Commonly known as: PLAVIX   insulin glargine 100 unit/mL Sopn Commonly known as: LANTUS Replaced by: insulin glargine 100 UNIT/ML injection   olmesartan 40 MG tablet Commonly known as: BENICAR     TAKE these medications   acetaminophen 325 MG tablet Commonly known as: TYLENOL Take 2 tablets (650 mg total) by mouth every 6 (six) hours as needed for headache, fever or mild pain.   albuterol 108 (90 Base) MCG/ACT inhaler Commonly known as: ProAir HFA INHALE 2 PUFFS EVERY 6 HOURS AS NEEDED FOR SHORTNESS OF BREATH/WHEEZING. What changed:   how much to take  how to take this  when to take this  reasons to take this  additional instructions   amLODipine 10 MG tablet Commonly known as: NORVASC TAKE 1 TABLET BY MOUTH ONCE A DAY.   apixaban 5 MG Tabs tablet Commonly known as: Eliquis Take 1 tablet (5 mg total) by mouth 2 (two) times daily. Okay to restart Eliquis on 11/17/2020 Start taking on: November 17, 2020 What changed:   additional instructions  These instructions start on November 17, 2020. If you are unsure what to do until then, ask your doctor or other care provider.   cloNIDine 0.2 MG tablet Commonly known as: Catapres Take 1 tablet (0.2 mg total) by mouth 2 (two) times daily. What changed:   medication strength  how much to take   DULoxetine 60 MG capsule Commonly known as: CYMBALTA Take 1 capsule (60 mg total) by mouth daily. What changed: medication strength   furosemide 20 MG tablet Commonly known as:  Lasix Take 1 tablet (20 mg total) by mouth daily. For fluid   gabapentin 300 MG capsule Commonly known as: NEURONTIN TAKE 1 CAPSULE BY MOUTH THREE TIMES A DAY.   insulin aspart 100 UNIT/ML FlexPen Commonly known as: NOVOLOG Inject 0-10 Units into the skin 3 (three) times daily with meals. insulin aspart (novoLOG) injection 0-10 Units 0-10 Units Subcutaneous, 3 times daily with meals CBG < 70: Implement Hypoglycemia Standing Orders and refer to Hypoglycemia Standing Orders sidebar report  CBG 70 - 120: 0 unit CBG 121 - 150: 0 unit  CBG 151 - 200: 1 unit CBG 201 - 250: 2 units CBG 251 - 300: 4 units CBG 301 - 350: 6 units  CBG 351 - 400: 8 units  CBG > 400: 10 units   insulin glargine 100 UNIT/ML injection Commonly known as: LANTUS Inject 0.09 mLs (9 Units total) into the skin daily. Replaces: insulin glargine 100 unit/mL Sopn   levETIRAcetam 750 MG tablet Commonly known as: KEPPRA Take 1 tablet (750 mg total) by mouth 2 (two) times daily.   metoprolol succinate 50 MG 24 hr tablet Commonly known as: TOPROL-XL TAKE 1 TABLET BY MOUTH TWICE A DAY. What changed: when to take this   montelukast 10  MG tablet Commonly known as: SINGULAIR TAKE (1) TABLET BY MOUTH AT BEDTIME. What changed: See the new instructions.   oxybutynin 5 MG 24 hr tablet Commonly known as: DITROPAN-XL Take 5 mg by mouth at bedtime.   pantoprazole 40 MG tablet Commonly known as: PROTONIX Take 1 tablet (40 mg total) by mouth 2 (two) times daily. For stomach ulcers   rosuvastatin 10 MG tablet Commonly known as: CRESTOR TAKE 1 TABLET BY MOUTH ONCE A DAY.   saccharomyces boulardii 250 MG capsule Commonly known as: FLORASTOR Take 1 capsule (250 mg total) by mouth 2 (two) times daily for 14 days.   sitaGLIPtin 100 MG tablet Commonly known as: JANUVIA Take 100 mg by mouth daily.   Spiriva HandiHaler 18 MCG inhalation capsule Generic drug: tiotropium Place 18 mcg into inhaler and inhale daily as needed  (Shortness of breath).   traZODone 50 MG tablet Commonly known as: DESYREL TAKE (1) TABLET BY MOUTH AT BEDTIME. What changed: See the new instructions.     ASK your doctor about these medications   guaiFENesin 600 MG 12 hr tablet Commonly known as: MUCINEX Take 2 tablets (1,200 mg total) by mouth 2 (two) times daily for 5 days. Ask about: Should I take this medication?       Major procedures and Radiology Reports - PLEASE review detailed and final reports for all details, in brief -   CT ABDOMEN PELVIS WO CONTRAST  Result Date: 10/29/2020 CLINICAL DATA:  Nausea vomiting EXAM: CT ABDOMEN AND PELVIS WITHOUT CONTRAST TECHNIQUE: Multidetector CT imaging of the abdomen and pelvis was performed following the standard protocol without IV contrast. COMPARISON:  Radiograph 08/21/2020, CT 07/12/2018 FINDINGS: Lower chest: Lung bases demonstrate small bilateral pleural effusions. Hazy posterior lung bases likely atelectasis. Cardiomegaly. Mild circumferential distal esophageal thickening. Hepatobiliary: No focal liver abnormality is seen. No gallstones, gallbladder wall thickening, or biliary dilatation. Pancreas: Unremarkable. No pancreatic ductal dilatation or surrounding inflammatory changes. Spleen: Normal in size without focal abnormality. Adrenals/Urinary Tract: Adrenal glands are within normal limits. Kidneys show no hydronephrosis. Nonspecific perinephric fat stranding. Markedly distended urinary bladder. Stomach/Bowel: The stomach is nonenlarged. No dilated small bowel. Negative appendix. No acute bowel wall thickening. Vascular/Lymphatic: Moderate aortic atherosclerosis. No aneurysm. No suspicious nodes. Reproductive: Uterus is unremarkable.  No adnexal mass. Other: Negative for free air or free fluid. Musculoskeletal: Moderate superior endplate compression deformity at L1 with about 40% loss of height of anterior vertebral body. Degenerative changes most advanced at L3-L4 and L5-S1. IMPRESSION:  1. No CT evidence for acute intra-abdominal or pelvic abnormality. 2. Mild distal esophageal thickening, question esophagitis or reflux. 3. Small bilateral pleural effusions. Cardiomegaly. 4. Moderate superior endplate compression deformity at L1, of uncertain age, new since 2019. Aortic Atherosclerosis (ICD10-I70.0). Electronically Signed   By: Donavan Foil M.D.   On: 10/29/2020 18:57   DG CHEST PORT 1 VIEW  Result Date: 11/02/2020 CLINICAL DATA:  Chest pain when swallowing water EXAM: PORTABLE CHEST 1 VIEW COMPARISON:  10/29/2020 FINDINGS: Cardiomegaly and aortic tortuosity. Stable lung markings. There is no edema, consolidation, effusion, or pneumothorax. IMPRESSION: 1. No acute finding when compared to prior. 2. Cardiomegaly. Electronically Signed   By: Monte Fantasia M.D.   On: 11/02/2020 04:36   DG Chest Port 1 View  Result Date: 10/29/2020 CLINICAL DATA:  Questionable sepsis. EXAM: PORTABLE CHEST 1 VIEW COMPARISON:  August 19, 2020. FINDINGS: Enlarged cardiac silhouette, similar to prior. No confluent consolidation. Streaky left basilar opacity. No visible pleural effusions or pneumothorax on  this semi upright portable radiograph. No acute osseous abnormality. IMPRESSION: Streaky left basilar opacity, favored to reflect atelectasis. No confluent consolidation. Electronically Signed   By: Margaretha Sheffield MD   On: 10/29/2020 15:04    Micro Results   No results found for this or any previous visit (from the past 240 hour(s)).   Today   Subjective    Shyloh Derosa today has no   new concerns, eating and drinking well, no further diarrhea No fever  Or chills    Patient has been seen and examined prior to discharge   Objective   Blood pressure 128/87, pulse 83, temperature 98.7 F (37.1 C), temperature source Oral, resp. rate 20, height '5\' 1"'  (1.549 m), weight 78.9 kg, last menstrual period 05/19/2016, SpO2 97 %.  No intake or output data in the 24 hours ending 11/14/20  1505  Exam Gen:- Awake Alert, no acute distress  HEENT:- .AT, No sclera icterus Neck-Supple Neck,No JVD,.  Lungs-  CTAB , good air movement bilaterally  CV- S1, S2 normal, regular Abd-  +ve B.Sounds, Abd Soft, No tenderness,    Extremity/Skin:- No  edema,   good pulses Psych-affect is appropriate, oriented x3, emotionally labile Neuro-generalized weakness, no new focal deficits, no tremors    Data Review   CBC w Diff:  Lab Results  Component Value Date   WBC 5.6 11/14/2020   HGB 9.0 (L) 11/14/2020   HCT 29.8 (L) 11/14/2020   PLT 236 11/14/2020   LYMPHOPCT 5 10/29/2020   MONOPCT 3 10/29/2020   EOSPCT 0 10/29/2020   BASOPCT 0 10/29/2020    CMP:  Lab Results  Component Value Date   NA 138 11/14/2020   K 4.2 11/14/2020   CL 108 11/14/2020   CO2 25 11/14/2020   BUN 14 11/14/2020   CREATININE 1.55 (H) 11/14/2020   CREATININE 1.56 (H) 07/26/2018   PROT 4.9 (L) 11/14/2020   ALBUMIN 2.0 (L) 11/14/2020   BILITOT 0.5 11/14/2020   ALKPHOS 68 11/14/2020   AST 44 (H) 11/14/2020   ALT 39 11/14/2020  .   Total Discharge time is about 33 minutes  Roxan Hockey M.D on 11/14/2020 at 3:05 PM  Go to [email or web address] -  for contact info  [facility] - Office  [phone]

## 2020-11-14 NOTE — Telephone Encounter (Signed)
Please arrange for nonurgent hospital follow up.

## 2020-11-14 NOTE — Plan of Care (Signed)
Problem: Education: Goal: Knowledge of General Education information will improve Description: Including pain rating scale, medication(s)/side effects and non-pharmacologic comfort measures 11/14/2020 1130 by Cameron Ali, RN Outcome: Completed/Met 11/14/2020 0845 by Cameron Ali, RN Outcome: Progressing   Problem: Health Behavior/Discharge Planning: Goal: Ability to manage health-related needs will improve 11/14/2020 1130 by Cameron Ali, RN Outcome: Completed/Met 11/14/2020 0845 by Cameron Ali, RN Outcome: Progressing   Problem: Clinical Measurements: Goal: Ability to maintain clinical measurements within normal limits will improve 11/14/2020 1130 by Cameron Ali, RN Outcome: Completed/Met 11/14/2020 0845 by Cameron Ali, RN Outcome: Progressing Goal: Will remain free from infection 11/14/2020 1130 by Cameron Ali, RN Outcome: Completed/Met 11/14/2020 0845 by Cameron Ali, RN Outcome: Progressing Goal: Diagnostic test results will improve 11/14/2020 1130 by Cameron Ali, RN Outcome: Completed/Met 11/14/2020 0845 by Cameron Ali, RN Outcome: Progressing Goal: Respiratory complications will improve 11/14/2020 1130 by Cameron Ali, RN Outcome: Completed/Met 11/14/2020 0845 by Cameron Ali, RN Outcome: Progressing Goal: Cardiovascular complication will be avoided 11/14/2020 1130 by Cameron Ali, RN Outcome: Completed/Met 11/14/2020 0845 by Cameron Ali, RN Outcome: Progressing   Problem: Activity: Goal: Risk for activity intolerance will decrease 11/14/2020 1130 by Cameron Ali, RN Outcome: Completed/Met 11/14/2020 0845 by Cameron Ali, RN Outcome: Progressing   Problem: Nutrition: Goal: Adequate nutrition will be maintained 11/14/2020 1130 by Cameron Ali, RN Outcome: Completed/Met 11/14/2020 0845 by Cameron Ali, RN Outcome: Progressing   Problem: Coping: Goal: Level of anxiety will  decrease 11/14/2020 1130 by Cameron Ali, RN Outcome: Completed/Met 11/14/2020 0845 by Cameron Ali, RN Outcome: Progressing   Problem: Elimination: Goal: Will not experience complications related to bowel motility 11/14/2020 1130 by Cameron Ali, RN Outcome: Completed/Met 11/14/2020 0845 by Cameron Ali, RN Outcome: Progressing Goal: Will not experience complications related to urinary retention 11/14/2020 1130 by Cameron Ali, RN Outcome: Completed/Met 11/14/2020 0845 by Cameron Ali, RN Outcome: Progressing   Problem: Pain Managment: Goal: General experience of comfort will improve 11/14/2020 1130 by Cameron Ali, RN Outcome: Completed/Met 11/14/2020 0845 by Cameron Ali, RN Outcome: Progressing   Problem: Skin Integrity: Goal: Risk for impaired skin integrity will decrease 11/14/2020 1130 by Cameron Ali, RN Outcome: Completed/Met 11/14/2020 0845 by Cameron Ali, RN Outcome: Progressing   Problem: Education: Goal: Ability to describe self-care measures that may prevent or decrease complications (Diabetes Survival Skills Education) will improve 11/14/2020 1130 by Cameron Ali, RN Outcome: Completed/Met 11/14/2020 0845 by Cameron Ali, RN Outcome: Progressing Goal: Individualized Educational Video(s) 11/14/2020 1130 by Cameron Ali, RN Outcome: Completed/Met 11/14/2020 0845 by Cameron Ali, RN Outcome: Progressing   Problem: Cardiac: Goal: Ability to maintain an adequate cardiac output will improve 11/14/2020 1130 by Cameron Ali, RN Outcome: Completed/Met 11/14/2020 0845 by Cameron Ali, RN Outcome: Progressing   Problem: Health Behavior/Discharge Planning: Goal: Ability to identify and utilize available resources and services will improve 11/14/2020 1130 by Cameron Ali, RN Outcome: Completed/Met 11/14/2020 0845 by Cameron Ali, RN Outcome: Progressing Goal: Ability to manage  health-related needs will improve 11/14/2020 1130 by Cameron Ali, RN Outcome: Completed/Met 11/14/2020 0845 by Cameron Ali, RN Outcome: Progressing   Problem: Fluid Volume: Goal: Ability to achieve a balanced intake and output will improve 11/14/2020 1130 by Cameron Ali, RN Outcome: Completed/Met 11/14/2020 0845 by Cameron Ali, RN Outcome: Progressing   Problem: Fluid  Volume: Goal: Ability to achieve a balanced intake and output will improve 11/14/2020 1130 by Cameron Ali, RN Outcome: Completed/Met 11/14/2020 0845 by Cameron Ali, RN Outcome: Progressing   Problem: Metabolic: Goal: Ability to maintain appropriate glucose levels will improve 11/14/2020 1130 by Cameron Ali, RN Outcome: Completed/Met 11/14/2020 0845 by Cameron Ali, RN Outcome: Progressing   Problem: Respiratory: Goal: Will regain and/or maintain adequate ventilation 11/14/2020 1130 by Cameron Ali, RN Outcome: Completed/Met 11/14/2020 0845 by Cameron Ali, RN Outcome: Progressing   Problem: Urinary Elimination: Goal: Ability to achieve and maintain adequate renal perfusion and functioning will improve 11/14/2020 1130 by Cameron Ali, RN Outcome: Completed/Met 11/14/2020 0845 by Cameron Ali, RN Outcome: Progressing

## 2020-11-14 NOTE — TOC Transition Note (Signed)
Transition of Care [facility]) - CM/SW Discharge Note   Patient Details  Name: [name] MRN: [id number] Date of Birth: [birth year]  Transition of Care [facility]) CM/SW Contact:  Shade Flood, LCSW Phone Number: 11/14/2020, 2:15 PM   Clinical Narrative:     Pt stable for dc. [facility] can accept pt today. DC clinical will be sent electronically. RN to call report. EMS arranged. Updated pt's brother, [name]. There are no other TOC needs for dc.  Final next level of care: Skilled Nursing Facility Barriers to Discharge: Barriers Resolved   Patient Goals and CMS Choice Patient states their goals for this hospitalization and ongoing recovery are:: feel better CMS Medicare.gov Compare Post Acute Care list provided to:: Patient Choice offered to / list presented to : Patient  Discharge Placement              Patient chooses bed at: Other - please specify in the comment section below: ([facility]) Patient to be transferred to facility by: EMS Name of family member notified: [name] Patient and family notified of of transfer: 11/14/20  Discharge Plan and Services In-house Referral: Clinical Social Work   Post Acute Care Choice: [place]                               Social Determinants of Health (SDOH) Interventions     Readmission Risk Interventions Readmission Risk Prevention Plan 10/31/2020 08/27/2020  Transportation Screening Complete Complete  Medication Review [employer]) Complete Complete  PCP or Specialist appointment within 3-5 days of discharge - Complete  HRI or [place] - Complete  SW Recovery Care/Counseling Consult Complete Complete  Palliative Care Screening Not Applicable Not Applicable  Skilled Nursing Facility Complete Complete  Some recent data might be hidden

## 2020-11-14 NOTE — Progress Notes (Signed)
Discharge instructions given to nurse [facility] of [facility]. Answered all questions at this time. All belongings sent with patient. Await EMS

## 2020-11-14 NOTE — Care Management Important Message (Signed)
Important Message  Patient Details  Name: [name] MRN: [id number] Date of Birth: 10-24-1966   Medicare Important Message Given:  Yes (Crystal, RN was asked to deliver due to precautions)     Tommy Medal 11/14/2020, 12:53 PM

## 2020-11-14 NOTE — Plan of Care (Signed)
  Problem: Education: Goal: Knowledge of General Education information will improve Description: Including pain rating scale, medication(s)/side effects and non-pharmacologic comfort measures Outcome: Progressing   Problem: Health Behavior/Discharge Planning: Goal: Ability to manage health-related needs will improve Outcome: Progressing   Problem: Clinical Measurements: Goal: Ability to maintain clinical measurements within normal limits will improve Outcome: Progressing Goal: Will remain free from infection Outcome: Progressing Goal: Diagnostic test results will improve Outcome: Progressing Goal: Respiratory complications will improve Outcome: Progressing Goal: Cardiovascular complication will be avoided Outcome: Progressing   Problem: Nutrition: Goal: Adequate nutrition will be maintained Outcome: Progressing   Problem: Activity: Goal: Risk for activity intolerance will decrease Outcome: Progressing   Problem: Coping: Goal: Level of anxiety will decrease Outcome: Progressing   Problem: Elimination: Goal: Will not experience complications related to bowel motility Outcome: Progressing Goal: Will not experience complications related to urinary retention Outcome: Progressing   Problem: Pain Managment: Goal: General experience of comfort will improve Outcome: Progressing   Problem: Safety: Goal: Ability to remain free from injury will improve Outcome: Progressing   Problem: Skin Integrity: Goal: Risk for impaired skin integrity will decrease Outcome: Progressing   Problem: Education: Goal: Ability to describe self-care measures that may prevent or decrease complications (Diabetes Survival Skills Education) will improve Outcome: Progressing Goal: Individualized Educational Video(s) Outcome: Progressing   Problem: Health Behavior/Discharge Planning: Goal: Ability to identify and utilize available resources and services will improve Outcome: Progressing Goal:  Ability to manage health-related needs will improve Outcome: Progressing   Problem: Cardiac: Goal: Ability to maintain an adequate cardiac output will improve Outcome: Progressing   Problem: Fluid Volume: Goal: Ability to achieve a balanced intake and output will improve Outcome: Progressing

## 2020-11-15 LAB — GLUCOSE, CAPILLARY
Glucose-Capillary: 145 mg/dL — ABNORMAL HIGH (ref 70–99)
Glucose-Capillary: 151 mg/dL — ABNORMAL HIGH (ref 70–99)

## 2020-11-18 ENCOUNTER — Encounter: Payer: Self-pay | Admitting: Gastroenterology

## 2020-11-18 NOTE — Telephone Encounter (Signed)
Patient scheduled.

## 2020-11-24 DIAGNOSIS — R051 Acute cough: Secondary | ICD-10-CM | POA: Diagnosis not present

## 2020-11-25 DIAGNOSIS — M6281 Muscle weakness (generalized): Secondary | ICD-10-CM | POA: Diagnosis not present

## 2020-11-25 DIAGNOSIS — U071 COVID-19: Secondary | ICD-10-CM | POA: Diagnosis not present

## 2020-11-25 DIAGNOSIS — R2681 Unsteadiness on feet: Secondary | ICD-10-CM | POA: Diagnosis not present

## 2020-11-25 DIAGNOSIS — I10 Essential (primary) hypertension: Secondary | ICD-10-CM | POA: Diagnosis not present

## 2020-11-25 DIAGNOSIS — I4891 Unspecified atrial fibrillation: Secondary | ICD-10-CM | POA: Diagnosis not present

## 2020-11-25 DIAGNOSIS — G9340 Encephalopathy, unspecified: Secondary | ICD-10-CM | POA: Diagnosis not present

## 2020-11-25 DIAGNOSIS — I482 Chronic atrial fibrillation, unspecified: Secondary | ICD-10-CM | POA: Diagnosis not present

## 2020-11-25 DIAGNOSIS — I639 Cerebral infarction, unspecified: Secondary | ICD-10-CM | POA: Diagnosis not present

## 2020-11-25 DIAGNOSIS — N183 Chronic kidney disease, stage 3 unspecified: Secondary | ICD-10-CM | POA: Diagnosis not present

## 2020-11-25 DIAGNOSIS — R7989 Other specified abnormal findings of blood chemistry: Secondary | ICD-10-CM | POA: Diagnosis not present

## 2020-11-25 DIAGNOSIS — E785 Hyperlipidemia, unspecified: Secondary | ICD-10-CM | POA: Diagnosis not present

## 2020-11-25 DIAGNOSIS — E119 Type 2 diabetes mellitus without complications: Secondary | ICD-10-CM | POA: Diagnosis not present

## 2020-11-25 DIAGNOSIS — I5030 Unspecified diastolic (congestive) heart failure: Secondary | ICD-10-CM | POA: Diagnosis not present

## 2020-11-25 DIAGNOSIS — I480 Paroxysmal atrial fibrillation: Secondary | ICD-10-CM | POA: Diagnosis not present

## 2020-11-26 DIAGNOSIS — I509 Heart failure, unspecified: Secondary | ICD-10-CM | POA: Diagnosis not present

## 2020-12-03 ENCOUNTER — Emergency Department (HOSPITAL_COMMUNITY): Payer: Medicare Other

## 2020-12-03 ENCOUNTER — Encounter (HOSPITAL_COMMUNITY): Payer: Self-pay

## 2020-12-03 ENCOUNTER — Inpatient Hospital Stay (HOSPITAL_COMMUNITY)
Admission: EM | Admit: 2020-12-03 | Discharge: 2020-12-07 | DRG: 177 | Disposition: A | Discharge status: Skilled Nursing Facility | Payer: Medicare Other | Source: Skilled Nursing Facility | Attending: Family Medicine | Admitting: Family Medicine

## 2020-12-03 ENCOUNTER — Other Ambulatory Visit: Payer: Self-pay

## 2020-12-03 DIAGNOSIS — I10 Essential (primary) hypertension: Secondary | ICD-10-CM | POA: Diagnosis not present

## 2020-12-03 DIAGNOSIS — J440 Chronic obstructive pulmonary disease with acute lower respiratory infection: Secondary | ICD-10-CM | POA: Diagnosis not present

## 2020-12-03 DIAGNOSIS — I5042 Chronic combined systolic (congestive) and diastolic (congestive) heart failure: Secondary | ICD-10-CM | POA: Diagnosis present

## 2020-12-03 DIAGNOSIS — E785 Hyperlipidemia, unspecified: Secondary | ICD-10-CM | POA: Diagnosis present

## 2020-12-03 DIAGNOSIS — R531 Weakness: Secondary | ICD-10-CM | POA: Diagnosis not present

## 2020-12-03 DIAGNOSIS — N1831 Chronic kidney disease, stage 3a: Secondary | ICD-10-CM | POA: Diagnosis not present

## 2020-12-03 DIAGNOSIS — J449 Chronic obstructive pulmonary disease, unspecified: Secondary | ICD-10-CM | POA: Diagnosis not present

## 2020-12-03 DIAGNOSIS — J1282 Pneumonia due to coronavirus disease 2019: Secondary | ICD-10-CM

## 2020-12-03 DIAGNOSIS — U071 COVID-19: Principal | ICD-10-CM | POA: Diagnosis present

## 2020-12-03 DIAGNOSIS — Z7901 Long term (current) use of anticoagulants: Secondary | ICD-10-CM

## 2020-12-03 DIAGNOSIS — I130 Hypertensive heart and chronic kidney disease with heart failure and stage 1 through stage 4 chronic kidney disease, or unspecified chronic kidney disease: Secondary | ICD-10-CM | POA: Diagnosis not present

## 2020-12-03 DIAGNOSIS — E1165 Type 2 diabetes mellitus with hyperglycemia: Secondary | ICD-10-CM | POA: Diagnosis not present

## 2020-12-03 DIAGNOSIS — R509 Fever, unspecified: Secondary | ICD-10-CM | POA: Diagnosis not present

## 2020-12-03 DIAGNOSIS — I471 Supraventricular tachycardia: Secondary | ICD-10-CM | POA: Diagnosis present

## 2020-12-03 DIAGNOSIS — Z8673 Personal history of transient ischemic attack (TIA), and cerebral infarction without residual deficits: Secondary | ICD-10-CM

## 2020-12-03 DIAGNOSIS — Z825 Family history of asthma and other chronic lower respiratory diseases: Secondary | ICD-10-CM

## 2020-12-03 DIAGNOSIS — Z87891 Personal history of nicotine dependence: Secondary | ICD-10-CM | POA: Diagnosis not present

## 2020-12-03 DIAGNOSIS — I480 Paroxysmal atrial fibrillation: Secondary | ICD-10-CM | POA: Diagnosis not present

## 2020-12-03 DIAGNOSIS — E1110 Type 2 diabetes mellitus with ketoacidosis without coma: Secondary | ICD-10-CM

## 2020-12-03 DIAGNOSIS — F410 Panic disorder [episodic paroxysmal anxiety] without agoraphobia: Secondary | ICD-10-CM | POA: Diagnosis present

## 2020-12-03 DIAGNOSIS — M6281 Muscle weakness (generalized): Secondary | ICD-10-CM | POA: Diagnosis not present

## 2020-12-03 DIAGNOSIS — R000 Tachycardia, unspecified: Secondary | ICD-10-CM | POA: Diagnosis not present

## 2020-12-03 DIAGNOSIS — G629 Polyneuropathy, unspecified: Secondary | ICD-10-CM | POA: Diagnosis not present

## 2020-12-03 DIAGNOSIS — R6889 Other general symptoms and signs: Secondary | ICD-10-CM | POA: Diagnosis not present

## 2020-12-03 DIAGNOSIS — Z794 Long term (current) use of insulin: Secondary | ICD-10-CM

## 2020-12-03 DIAGNOSIS — I69354 Hemiplegia and hemiparesis following cerebral infarction affecting left non-dominant side: Secondary | ICD-10-CM | POA: Diagnosis not present

## 2020-12-03 DIAGNOSIS — N179 Acute kidney failure, unspecified: Secondary | ICD-10-CM | POA: Diagnosis present

## 2020-12-03 DIAGNOSIS — N183 Chronic kidney disease, stage 3 unspecified: Secondary | ICD-10-CM | POA: Diagnosis not present

## 2020-12-03 DIAGNOSIS — I1310 Hypertensive heart and chronic kidney disease without heart failure, with stage 1 through stage 4 chronic kidney disease, or unspecified chronic kidney disease: Secondary | ICD-10-CM | POA: Diagnosis not present

## 2020-12-03 DIAGNOSIS — I69054 Hemiplegia and hemiparesis following nontraumatic subarachnoid hemorrhage affecting left non-dominant side: Secondary | ICD-10-CM | POA: Diagnosis not present

## 2020-12-03 DIAGNOSIS — R0602 Shortness of breath: Secondary | ICD-10-CM | POA: Diagnosis not present

## 2020-12-03 DIAGNOSIS — Z8249 Family history of ischemic heart disease and other diseases of the circulatory system: Secondary | ICD-10-CM | POA: Diagnosis not present

## 2020-12-03 DIAGNOSIS — E871 Hypo-osmolality and hyponatremia: Secondary | ICD-10-CM | POA: Diagnosis not present

## 2020-12-03 DIAGNOSIS — R0902 Hypoxemia: Secondary | ICD-10-CM | POA: Diagnosis not present

## 2020-12-03 DIAGNOSIS — G894 Chronic pain syndrome: Secondary | ICD-10-CM | POA: Diagnosis not present

## 2020-12-03 DIAGNOSIS — D631 Anemia in chronic kidney disease: Secondary | ICD-10-CM | POA: Diagnosis present

## 2020-12-03 DIAGNOSIS — E1122 Type 2 diabetes mellitus with diabetic chronic kidney disease: Secondary | ICD-10-CM | POA: Diagnosis present

## 2020-12-03 DIAGNOSIS — Z79899 Other long term (current) drug therapy: Secondary | ICD-10-CM

## 2020-12-03 DIAGNOSIS — G40909 Epilepsy, unspecified, not intractable, without status epilepticus: Secondary | ICD-10-CM

## 2020-12-03 DIAGNOSIS — I517 Cardiomegaly: Secondary | ICD-10-CM | POA: Diagnosis not present

## 2020-12-03 DIAGNOSIS — E118 Type 2 diabetes mellitus with unspecified complications: Secondary | ICD-10-CM | POA: Diagnosis not present

## 2020-12-03 DIAGNOSIS — R569 Unspecified convulsions: Secondary | ICD-10-CM | POA: Diagnosis not present

## 2020-12-03 DIAGNOSIS — N1832 Chronic kidney disease, stage 3b: Secondary | ICD-10-CM | POA: Diagnosis not present

## 2020-12-03 DIAGNOSIS — E1159 Type 2 diabetes mellitus with other circulatory complications: Secondary | ICD-10-CM | POA: Diagnosis not present

## 2020-12-03 DIAGNOSIS — Z833 Family history of diabetes mellitus: Secondary | ICD-10-CM

## 2020-12-03 DIAGNOSIS — J9601 Acute respiratory failure with hypoxia: Secondary | ICD-10-CM | POA: Diagnosis present

## 2020-12-03 DIAGNOSIS — Z7984 Long term (current) use of oral hypoglycemic drugs: Secondary | ICD-10-CM

## 2020-12-03 DIAGNOSIS — R2681 Unsteadiness on feet: Secondary | ICD-10-CM | POA: Diagnosis not present

## 2020-12-03 DIAGNOSIS — K219 Gastro-esophageal reflux disease without esophagitis: Secondary | ICD-10-CM | POA: Diagnosis present

## 2020-12-03 DIAGNOSIS — Z809 Family history of malignant neoplasm, unspecified: Secondary | ICD-10-CM

## 2020-12-03 DIAGNOSIS — Z7401 Bed confinement status: Secondary | ICD-10-CM | POA: Diagnosis not present

## 2020-12-03 DIAGNOSIS — G4730 Sleep apnea, unspecified: Secondary | ICD-10-CM | POA: Diagnosis not present

## 2020-12-03 DIAGNOSIS — F32A Depression, unspecified: Secondary | ICD-10-CM | POA: Diagnosis not present

## 2020-12-03 DIAGNOSIS — R2689 Other abnormalities of gait and mobility: Secondary | ICD-10-CM | POA: Diagnosis not present

## 2020-12-03 DIAGNOSIS — I639 Cerebral infarction, unspecified: Secondary | ICD-10-CM | POA: Diagnosis not present

## 2020-12-03 HISTORY — DX: Acute respiratory failure with hypoxia: J96.01

## 2020-12-03 HISTORY — DX: Pneumonia due to coronavirus disease 2019: J12.82

## 2020-12-03 HISTORY — DX: COVID-19: U07.1

## 2020-12-03 LAB — C-REACTIVE PROTEIN: CRP: 33.5 mg/dL — ABNORMAL HIGH (ref <1.0)

## 2020-12-03 LAB — CBC WITH DIFFERENTIAL/PLATELET
Abs Immature Granulocytes: 0.04 10*3/uL (ref 0.00–0.07)
Basophils Absolute: 0 10*3/uL (ref 0.0–0.1)
Basophils Relative: 0 %
Eosinophils Absolute: 0.1 10*3/uL (ref 0.0–0.5)
Eosinophils Relative: 1 %
HCT: 32.5 % — ABNORMAL LOW (ref 36.0–46.0)
Hemoglobin: 10.1 g/dL — ABNORMAL LOW (ref 12.0–15.0)
Immature Granulocytes: 1 %
Lymphocytes Relative: 13 %
Lymphs Abs: 1 10*3/uL (ref 0.7–4.0)
MCH: 28.9 pg (ref 26.0–34.0)
MCHC: 31.1 g/dL (ref 30.0–36.0)
MCV: 93.1 fL (ref 80.0–100.0)
Monocytes Absolute: 0.5 10*3/uL (ref 0.1–1.0)
Monocytes Relative: 6 %
Neutro Abs: 5.9 10*3/uL (ref 1.7–7.7)
Neutrophils Relative %: 79 %
Platelets: 298 10*3/uL (ref 150–400)
RBC: 3.49 MIL/uL — ABNORMAL LOW (ref 3.87–5.11)
RDW: 13.7 % (ref 11.5–15.5)
WBC: 7.4 10*3/uL (ref 4.0–10.5)
nRBC: 0 % (ref 0.0–0.2)

## 2020-12-03 LAB — D-DIMER, QUANTITATIVE: D-Dimer, Quant: 1.46 ug/mL-FEU — ABNORMAL HIGH (ref 0.00–0.50)

## 2020-12-03 LAB — COMPREHENSIVE METABOLIC PANEL
ALT: 8 U/L (ref 0–44)
AST: 10 U/L — ABNORMAL LOW (ref 15–41)
Albumin: 2.3 g/dL — ABNORMAL LOW (ref 3.5–5.0)
Alkaline Phosphatase: 57 U/L (ref 38–126)
Anion gap: 12 (ref 5–15)
BUN: 34 mg/dL — ABNORMAL HIGH (ref 6–20)
CO2: 24 mmol/L (ref 22–32)
Calcium: 8.6 mg/dL — ABNORMAL LOW (ref 8.9–10.3)
Chloride: 97 mmol/L — ABNORMAL LOW (ref 98–111)
Creatinine, Ser: 1.83 mg/dL — ABNORMAL HIGH (ref 0.44–1.00)
GFR, Estimated: 32 mL/min — ABNORMAL LOW (ref >60)
Glucose, Bld: 253 mg/dL — ABNORMAL HIGH (ref 70–99)
Potassium: 3.6 mmol/L (ref 3.5–5.1)
Sodium: 133 mmol/L — ABNORMAL LOW (ref 135–145)
Total Bilirubin: 0.8 mg/dL (ref 0.3–1.2)
Total Protein: 7.2 g/dL (ref 6.5–8.1)

## 2020-12-03 LAB — HEMOGLOBIN A1C
Hgb A1c MFr Bld: 11.1 % — ABNORMAL HIGH (ref 4.8–5.6)
Mean Plasma Glucose: 271.87 mg/dL

## 2020-12-03 LAB — LACTIC ACID, PLASMA: Lactic Acid, Venous: 1.2 mmol/L (ref 0.5–1.9)

## 2020-12-03 LAB — TRIGLYCERIDES: Triglycerides: 261 mg/dL — ABNORMAL HIGH (ref <150)

## 2020-12-03 LAB — LACTATE DEHYDROGENASE: LDH: 228 U/L — ABNORMAL HIGH (ref 98–192)

## 2020-12-03 LAB — FERRITIN: Ferritin: 582 ng/mL — ABNORMAL HIGH (ref 11–307)

## 2020-12-03 LAB — POC SARS CORONAVIRUS 2 AG -  ED: SARS Coronavirus 2 Ag: POSITIVE — AB

## 2020-12-03 LAB — GLUCOSE, CAPILLARY: Glucose-Capillary: 277 mg/dL — ABNORMAL HIGH (ref 70–99)

## 2020-12-03 LAB — FIBRINOGEN: Fibrinogen: >800 mg/dL — ABNORMAL HIGH (ref 210–475)

## 2020-12-03 LAB — PROCALCITONIN: Procalcitonin: <0.1 ng/mL

## 2020-12-03 MED ORDER — TOCILIZUMAB 400 MG/20ML IV SOLN
8.0000 mg/kg | Freq: Once | INTRAVENOUS | Status: AC
  Administered 2020-12-04: 632 mg via INTRAVENOUS
  Filled 2020-12-03: qty 31.6

## 2020-12-03 MED ORDER — ASCORBIC ACID 500 MG PO TABS
500.0000 mg | ORAL_TABLET | Freq: Every day | ORAL | Status: DC
  Administered 2020-12-03 – 2020-12-07 (×5): 500 mg via ORAL
  Filled 2020-12-03 (×5): qty 1

## 2020-12-03 MED ORDER — POLYETHYLENE GLYCOL 3350 17 G PO PACK: 17.0000 g | PACK | Freq: Every day | ORAL | Status: DC | PRN

## 2020-12-03 MED ORDER — ONDANSETRON HCL 4 MG PO TABS: 4.0000 mg | ORAL_TABLET | Freq: Four times a day (QID) | ORAL | Status: DC | PRN

## 2020-12-03 MED ORDER — ALBUTEROL SULFATE HFA 108 (90 BASE) MCG/ACT IN AERS
2.0000 | INHALATION_SPRAY | Freq: Four times a day (QID) | RESPIRATORY_TRACT | Status: DC
  Administered 2020-12-03 – 2020-12-05 (×6): 2 via RESPIRATORY_TRACT
  Filled 2020-12-03: qty 6.7

## 2020-12-03 MED ORDER — ACETAMINOPHEN 325 MG PO TABS: 650.0000 mg | ORAL_TABLET | Freq: Four times a day (QID) | ORAL | Status: DC | PRN

## 2020-12-03 MED ORDER — APIXABAN 5 MG PO TABS
5.0000 mg | ORAL_TABLET | Freq: Two times a day (BID) | ORAL | Status: DC
  Administered 2020-12-03 – 2020-12-07 (×8): 5 mg via ORAL
  Filled 2020-12-03 (×8): qty 1

## 2020-12-03 MED ORDER — SODIUM CHLORIDE 0.9 % IV SOLN
200.0000 mg | Freq: Once | INTRAVENOUS | Status: AC
  Administered 2020-12-03: 200 mg via INTRAVENOUS
  Filled 2020-12-03: qty 40

## 2020-12-03 MED ORDER — METHYLPREDNISOLONE SODIUM SUCC 40 MG IJ SOLR
0.5000 mg/kg | Freq: Two times a day (BID) | INTRAMUSCULAR | Status: AC
  Administered 2020-12-04 – 2020-12-06 (×6): 39.6 mg via INTRAVENOUS
  Filled 2020-12-03 (×6): qty 1

## 2020-12-03 MED ORDER — PREDNISONE 20 MG PO TABS
50.0000 mg | ORAL_TABLET | Freq: Every day | ORAL | Status: DC
  Administered 2020-12-07: 50 mg via ORAL
  Filled 2020-12-03: qty 1

## 2020-12-03 MED ORDER — METHYLPREDNISOLONE SODIUM SUCC 125 MG IJ SOLR
125.0000 mg | Freq: Once | INTRAMUSCULAR | Status: AC
  Administered 2020-12-03: 125 mg via INTRAVENOUS
  Filled 2020-12-03: qty 2

## 2020-12-03 MED ORDER — INSULIN GLARGINE 100 UNIT/ML ~~LOC~~ SOLN
9.0000 [IU] | SUBCUTANEOUS | Status: DC
  Filled 2020-12-03 (×2): qty 0.09

## 2020-12-03 MED ORDER — PANTOPRAZOLE SODIUM 40 MG PO TBEC
40.0000 mg | DELAYED_RELEASE_TABLET | Freq: Two times a day (BID) | ORAL | Status: DC
  Administered 2020-12-03 – 2020-12-07 (×8): 40 mg via ORAL
  Filled 2020-12-03 (×8): qty 1

## 2020-12-03 MED ORDER — GABAPENTIN 300 MG PO CAPS
300.0000 mg | ORAL_CAPSULE | Freq: Three times a day (TID) | ORAL | Status: DC
  Administered 2020-12-03 – 2020-12-07 (×11): 300 mg via ORAL
  Filled 2020-12-03 (×11): qty 1

## 2020-12-03 MED ORDER — INSULIN ASPART 100 UNIT/ML ~~LOC~~ SOLN
0.0000 [IU] | Freq: Every day | SUBCUTANEOUS | Status: DC
  Administered 2020-12-03: 3 [IU] via SUBCUTANEOUS
  Administered 2020-12-04: 5 [IU] via SUBCUTANEOUS

## 2020-12-03 MED ORDER — MONTELUKAST SODIUM 10 MG PO TABS
10.0000 mg | ORAL_TABLET | Freq: Every day | ORAL | Status: DC
  Administered 2020-12-03 – 2020-12-06 (×4): 10 mg via ORAL
  Filled 2020-12-03 (×4): qty 1

## 2020-12-03 MED ORDER — INSULIN ASPART 100 UNIT/ML ~~LOC~~ SOLN
0.0000 [IU] | Freq: Three times a day (TID) | SUBCUTANEOUS | Status: DC
  Administered 2020-12-04 (×3): 15 [IU] via SUBCUTANEOUS

## 2020-12-03 MED ORDER — SODIUM CHLORIDE 0.9 % IV SOLN: INTRAVENOUS | Status: AC

## 2020-12-03 MED ORDER — GUAIFENESIN-DM 100-10 MG/5ML PO SYRP: 10.0000 mL | ORAL_SOLUTION | ORAL | Status: DC | PRN

## 2020-12-03 MED ORDER — ZINC SULFATE 220 (50 ZN) MG PO CAPS
220.0000 mg | ORAL_CAPSULE | Freq: Every day | ORAL | Status: DC
  Administered 2020-12-04 – 2020-12-07 (×5): 220 mg via ORAL
  Filled 2020-12-03 (×4): qty 1

## 2020-12-03 MED ORDER — ROSUVASTATIN CALCIUM 10 MG PO TABS
10.0000 mg | ORAL_TABLET | Freq: Every day | ORAL | Status: DC
  Administered 2020-12-03 – 2020-12-07 (×5): 10 mg via ORAL
  Filled 2020-12-03 (×5): qty 1

## 2020-12-03 MED ORDER — DULOXETINE HCL 60 MG PO CPEP
60.0000 mg | ORAL_CAPSULE | Freq: Every day | ORAL | Status: DC
  Administered 2020-12-04 – 2020-12-07 (×4): 60 mg via ORAL
  Filled 2020-12-03 (×6): qty 1

## 2020-12-03 MED ORDER — LEVETIRACETAM 500 MG PO TABS
750.0000 mg | ORAL_TABLET | Freq: Two times a day (BID) | ORAL | Status: DC
  Administered 2020-12-03 – 2020-12-07 (×8): 750 mg via ORAL
  Filled 2020-12-03 (×8): qty 1

## 2020-12-03 MED ORDER — METOPROLOL SUCCINATE ER 50 MG PO TB24
50.0000 mg | ORAL_TABLET | Freq: Two times a day (BID) | ORAL | Status: DC
  Administered 2020-12-04 – 2020-12-06 (×5): 50 mg via ORAL
  Filled 2020-12-03 (×5): qty 1

## 2020-12-03 MED ORDER — ONDANSETRON HCL 4 MG/2ML IJ SOLN: 4.0000 mg | Freq: Four times a day (QID) | INTRAMUSCULAR | Status: DC | PRN

## 2020-12-03 MED ORDER — SODIUM CHLORIDE 0.9 % IV SOLN
100.0000 mg | Freq: Every day | INTRAVENOUS | Status: DC
  Filled 2020-12-03 (×2): qty 20

## 2020-12-03 MED ORDER — OXYBUTYNIN CHLORIDE ER 5 MG PO TB24
5.0000 mg | ORAL_TABLET | Freq: Every day | ORAL | Status: DC
  Administered 2020-12-03 – 2020-12-06 (×4): 5 mg via ORAL
  Filled 2020-12-03 (×4): qty 1

## 2020-12-03 MED ORDER — SODIUM CHLORIDE 0.9 % IV BOLUS
500.0000 mL | Freq: Once | INTRAVENOUS | Status: AC
  Administered 2020-12-03: 500 mL via INTRAVENOUS
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
## 2020-12-03 NOTE — H&P (Addendum)
History and Physical    DEAJA RIZO ZOX:[id number] DOB: May 28, 1966 DOA: 12/03/2020  PCP: Caprice Renshaw, MD    Patient coming from: [name]  I have personally briefly reviewed patient's old medical records in [place]  Chief Complaint: Low O2 sats, Covid positive  HPI: [name] is a 55 y.o. female with medical history significant for diastolic and systolic CHF, diabetes mellitus, COPD, atrial fibrillation on chronic anticoagulation, hypertension, stroke, seizures. Patient was brought to the ED from nursing home reports of low oxygen level.  Patient tested positive for COVID, and had been on the COVID floor for about 1 - 2 weeks.  She was treated with azithomycin, steroids, augmentin and breathing treatments. Patient does not remember what date she tested positive. She is unvaccinated.  O2 sats were noted to be 71% on room air she was placed on 6 L. She was noted to have decreased oral intake today. She denies vomiting or loose stools.  To me, patient endorses cough, but she denies difficulty breathing.  I called nursing home to confirm date of positive test, no response.   Hospitalization 1/4 - 1/20  atrial fibrillation with RVR, GI bleed, DKA, and C. difficile positive diarrhea.   She was subequently discharged to SNF.   ED Course: ED O2 sats were 85% on 2 L, patient is currently on 6 L.  Will treat 17-22.  Heart rate 90s.  Blood pressure systolic 81-191.  Lactic acid 1.2.  D-dimer 1.46.  Procalcitonin less than 0.1.  Elevation in inflammatory markers and CRP 33.5.  Chest x-ray shows bilateral interstitial and patchy alveolar airspace opacities concerning for multilobar pneumonia, including atypical or viral. Hospitalist to admit.  Review of Systems: As per HPI all other systems reviewed and negative.  Past Medical History:  Diagnosis Date  . Alcohol use   . Ankle fracture, lateral malleolus, closed 2013  . Anxiety   . Breast mass, left 2013  . Chronic anemia   . CKD  (chronic kidney disease), stage III ([place])   . Cocaine abuse ([place])   . COPD (chronic obstructive pulmonary disease) ([place])   . Diabetes mellitus, type 2 ([place])   . Diabetic Charcot foot ([place])   . Essential hypertension   . History of cardiomyopathy   . History of GI bleed 2011  . Hyperlipidemia   . Noncompliance   . Obesity   . Panic attacks   . PAT (paroxysmal atrial tachycardia) (HCC)    Previously on Amiodarone  . Seizures ([place]) 10/01/2020  . Sleep apnea    Not on CPAP  . Stroke ([place]) 2018  . Tobacco abuse   . Urinary incontinence     Past Surgical History:  Procedure Laterality Date  . BIOPSY  11/12/2020   Procedure: BIOPSY;  Surgeon: Harvel Quale, MD;  [facility];  Service: Gastroenterology;;  . BREAST BIOPSY    . CARDIAC CATHETERIZATION N/A 07/28/2016   Procedure: Left Heart Cath and Coronary Angiography;  Surgeon: Jettie Booze, MD;  Location: [facility];  Service: Cardiovascular;  Laterality: N/A;  . COLONOSCOPY N/A 05/10/2019   Procedure: COLONOSCOPY;  Surgeon: Danie Binder, MD;  [facility];  Service: Endoscopy;  Laterality: N/A;  Phenergan 12.5 mg IV in pre-op  . DILATION AND CURETTAGE OF UTERUS    . ESOPHAGOGASTRODUODENOSCOPY (EGD) WITH PROPOFOL N/A 11/12/2020   Procedure: ESOPHAGOGASTRODUODENOSCOPY (EGD) WITH PROPOFOL;  Surgeon: Harvel Quale, MD;  [facility];  Service: Gastroenterology;  Laterality: N/A;  . I & D EXTREMITY Bilateral 09/22/2017   Procedure: BILATERAL DEBRIDEMENT LEG/FOOT ULCERS, APPLY VERAFLO WOUND VAC;  Surgeon: Newt Minion, MD;  Location: [facility];  Service: Orthopedics;  Laterality: Bilateral;  . I & D EXTREMITY Right 10/11/2018   Procedure: IRRIGATION AND DEBRIDEMENT RIGHT HAND;  Surgeon: [name], MD;  Location: [facility];  Service: Orthopedics;  Laterality: Right;  . I & D EXTREMITY Right 10/13/2018   Procedure: REPEAT IRRIGATION AND DEBRIDEMENT RIGHT HAND;  Surgeon:  [name], MD;  Location: [facility];  Service: Orthopedics;  Laterality: Right;  . I & D EXTREMITY Right 11/22/2018   Procedure: IRRIGATION AND DEBRIDEMENT AND PINNING RIGHT HAND;  Surgeon: [name], MD;  Location: [facility];  Service: Orthopedics;  Laterality: Right;  . IR RADIOLOGIST EVAL & MGMT  07/05/2018  . POLYPECTOMY  05/10/2019   Procedure: POLYPECTOMY;  Surgeon: Danie Binder, MD;  [facility];  Service: Endoscopy;;  . SKIN SPLIT GRAFT Bilateral 09/28/2017   Procedure: BILATERAL SPLIT THICKNESS SKIN GRAFT LEGS/FEET AND APPLY VAC;  Surgeon: Newt Minion, MD;  Location: [facility];  Service: Orthopedics;  Laterality: Bilateral;  . SKIN SPLIT GRAFT Right 11/22/2018   Procedure: SKIN GRAFT SPLIT THICKNESS;  Surgeon: [name], MD;  Location: [facility];  Service: Orthopedics;  Laterality: Right;     reports that she quit smoking about 3 years ago. Her smoking use included cigarettes. She has a 20.00 pack-year smoking history. She has never used smokeless tobacco. She reports previous alcohol use. She reports previous drug use. Frequency: 3.00 times per week. Drugs: Marijuana and Cocaine.  No Known Allergies  Family History  Problem Relation Age of Onset  . Hypertension Mother   . Heart attack Mother   . Hypertension Father        CABG   . Hypertension Sister   . Hypertension Brother   . Hypertension Sister   . Cancer Sister        breast   . Arthritis Other   . Cancer Other   . Diabetes Other   . Asthma Other   . Hypertension Daughter   . Hypertension Son     Prior to Admission medications   Medication Sig Start Date End Date Taking? Authorizing Provider  acetaminophen (TYLENOL) 325 MG tablet Take 2 tablets (650 mg total) by mouth every 6 (six) hours as needed for headache, fever or mild pain. 11/04/20   Johnson, Clanford L, MD  albuterol (PROAIR HFA) 108 (90 Base) MCG/ACT inhaler INHALE 2 PUFFS EVERY 6 HOURS AS NEEDED FOR SHORTNESS OF BREATH/WHEEZING. Patient  taking differently: Inhale 2 puffs into the lungs every 6 (six) hours as needed for wheezing or shortness of breath. 08/05/20   [name], MD  amLODipine (NORVASC) 10 MG tablet TAKE 1 TABLET BY MOUTH ONCE A DAY. Patient taking differently: Take 10 mg by mouth daily. 07/19/20   [name], MD  apixaban (ELIQUIS) 5 MG TABS tablet Take 1 tablet (5 mg total) by mouth 2 (two) times daily. Okay to restart Eliquis on 11/17/2020 11/17/20   Roxan Hockey, MD  cloNIDine (CATAPRES) 0.2 MG tablet Take 1 tablet (0.2 mg total) by mouth 2 (two) times daily. 11/04/20   Johnson, Clanford L, MD  DULoxetine (CYMBALTA) 60 MG capsule Take 1 capsule (60 mg total) by mouth daily. 11/08/20   Roxan Hockey, MD  furosemide (LASIX) 20 MG tablet Take 1 tablet (20 mg total) by mouth daily. For fluid  11/07/20 11/07/21  Roxan Hockey, MD  gabapentin (NEURONTIN) 300 MG capsule TAKE 1 CAPSULE BY MOUTH THREE TIMES A DAY. 11/14/19   Newt Minion, MD  insulin aspart (NOVOLOG) 100 UNIT/ML FlexPen Inject 0-10 Units into the skin 3 (three) times daily with meals. insulin aspart (novoLOG) injection 0-10 Units 0-10 Units Subcutaneous, 3 times daily with meals CBG < 70: Implement Hypoglycemia Standing Orders and refer to Hypoglycemia Standing Orders sidebar report  CBG 70 - 120: 0 unit CBG 121 - 150: 0 unit  CBG 151 - 200: 1 unit CBG 201 - 250: 2 units CBG 251 - 300: 4 units CBG 301 - 350: 6 units  CBG 351 - 400: 8 units  CBG > 400: 10 units 11/07/20   Tzion Wedel, Courage, MD  insulin glargine (LANTUS) 100 UNIT/ML injection Inject 0.09 mLs (9 Units total) into the skin daily. 11/07/20   Roxan Hockey, MD  levETIRAcetam (KEPPRA) 750 MG tablet Take 1 tablet (750 mg total) by mouth 2 (two) times daily. 08/28/20   Aline August, MD  metoprolol succinate (TOPROL-XL) 50 MG 24 hr tablet TAKE 1 TABLET BY MOUTH TWICE A DAY. Patient taking differently: Take 50 mg by mouth in the morning and at bedtime. 08/13/20   [name],  MD  montelukast (SINGULAIR) 10 MG tablet TAKE (1) TABLET BY MOUTH AT BEDTIME. Patient taking differently: Take 10 mg by mouth at bedtime. 07/19/20   [name], MD  oxybutynin (DITROPAN-XL) 5 MG 24 hr tablet Take 5 mg by mouth at bedtime.    [provider]  pantoprazole (PROTONIX) 40 MG tablet Take 1 tablet (40 mg total) by mouth 2 (two) times daily. For stomach ulcers 11/14/20   Roxan Hockey, MD  rosuvastatin (CRESTOR) 10 MG tablet TAKE 1 TABLET BY MOUTH ONCE A DAY. Patient taking differently: Take 10 mg by mouth daily. 07/19/20   [name], MD  sitaGLIPtin (JANUVIA) 100 MG tablet Take 100 mg by mouth daily.    [provider]  tiotropium (SPIRIVA HANDIHALER) 18 MCG inhalation capsule Place 18 mcg into inhaler and inhale daily as needed (Shortness of breath).     [provider]  traZODone (DESYREL) 50 MG tablet TAKE (1) TABLET BY MOUTH AT BEDTIME. Patient taking differently: Take 50 mg by mouth at bedtime. 07/19/20   [name], MD    Physical Exam: Vitals:   12/03/20 1322 12/03/20 1323 12/03/20 1326  Pulse: 90    Resp: (!) 22    Temp:  97.7 F (36.5 C)   TempSrc:  Oral   SpO2: (!) 85%  (!) 88%  Weight:  78.9 kg   Height:  5\' 1"  (1.549 m)     Constitutional: NAD, calm, comfortable Vitals:   12/03/20 1322 12/03/20 1323 12/03/20 1326  Pulse: 90    Resp: (!) 22    Temp:  97.7 F (36.5 C)   TempSrc:  Oral   SpO2: (!) 85%  (!) 88%  Weight:  78.9 kg   Height:  5\' 1"  (1.549 m)    Eyes: PERRL, lids and conjunctivae normal ENMT: Mucous membranes are moist. Posterior pharynx clear of any exudate or lesions. Neck: normal, supple, no masses, no thyromegaly Respiratory:  Normal respiratory effort. No accessory muscle use.  Cardiovascular: Intermittent sinus tachycardia to 120s, otherwise regular rate and rhythm, No extremity edema. 2+ pedal pulses.  Abdomen: no tenderness, no masses palpated. No hepatosplenomegaly. Bowel sounds  positive.  Musculoskeletal: no clubbing / cyanosis. No joint deformity  upper and lower extremities. Good ROM, no contractures. Normal muscle tone.  Skin: no rashes, lesions, ulcers. No induration Neurologic: No apparent cranial abnormality, moving all extremities spontaneously.Marland Kitchen  Psychiatric: Normal judgment and insight. Alert and oriented x 3. Normal mood.   Labs on Admission: I have personally reviewed following labs and imaging studies  CBC: Recent Labs  Lab 12/03/20 1432  WBC 7.4  NEUTROABS 5.9  HGB 10.1*  HCT 32.5*  MCV 93.1  PLT 062   Basic Metabolic Panel: Recent Labs  Lab 12/03/20 1432  NA 133*  K 3.6  CL 97*  CO2 24  GLUCOSE 253*  BUN 34*  CREATININE 1.83*  CALCIUM 8.6*   Liver Function Tests: Recent Labs  Lab 12/03/20 1432  AST 10*  ALT 8  ALKPHOS 57  BILITOT 0.8  PROT 7.2  ALBUMIN 2.3*   Lipid Profile: Recent Labs    12/03/20 1432  TRIG 261*   Anemia Panel: Recent Labs    12/03/20 1432  FERRITIN 582*    Radiological Exams on Admission: DG Chest Port 1 View  Result Date: 12/03/2020 CLINICAL DATA:  Shortness of breath EXAM: PORTABLE CHEST 1 VIEW COMPARISON:  11/02/2020 FINDINGS: Bilateral interstitial and patchy alveolar airspace opacities. No pleural effusion or pneumothorax. Stable cardiomegaly. No acute osseous abnormality. IMPRESSION: Bilateral interstitial and patchy alveolar airspace opacities concerning for multilobar pneumonia including atypical viral pneumonia. Electronically Signed   By: Kathreen Devoid   On: 12/03/2020 14:37    EKG: Independently reviewed.  Sinus tachycardia rate of 25, QTc 390.  No significant change from prior.  Assessment/Plan Principal Problem:   Pneumonia due to COVID-19 virus Active Problems:   Acute respiratory failure with hypoxia (HCC)   Essential hypertension   CKD (chronic kidney disease) stage 3, GFR 30-59 ml/min (HCC)   Chronic combined systolic and diastolic CHF (congestive heart failure) (HCC)    Type 2 diabetes mellitus with vascular disease (HCC)   PAF (paroxysmal atrial fibrillation) (HCC)   Seizures (HCC)   Pneumonia due to COVID-19 virus with acute hypoxic respiratory failure-O2 sats 85% on 2 L, currently on 6 L with sats ~ 93%..  Chest x-ray showing multifocal pneumonia.  Elevation in inflammatory enzymes, CRP up to 33.5.  Diagnosed with Covid in the past 1 to 2 weeks. -Remdesivir IV -IV Solu-Medrol -Actemra -As needed mucolytic's, albuterol inhaler, incentive spirometer, flutter valve -Obtain a trend inflammatory markers -Daily CBC, CMP  Mild AKI on CKD 3A-B-creatinine 1.8.  Baseline 1.2-1.5. -Gentle hydration N/s 75cc/hr x 37SEG  Chronic systolic and diastolic CHF-stable and compensated.  Last echo 10/21 EF 50 to 55%, with moderate LV diastolic parameters. -Hold 20 mg daily Lasix for now  Diabetes mellitus -random glucose 253. - SSI- M -Resume home Lantus 9 units nightly - HgbA1c  Seizures-  -Resume Keppra  HTN-soft. -Hold Norvasc 10 mg daily, hold clonidine 0.2 BID resume when able to avoid rebound hypertension -Continue metoprolol as she is quite tachycardic.  Depression -Resume Cymbalta  DVT prophylaxis: Eliquis Code Status: Full code Family Communication: None at bedside Disposition Plan:  ~/> 2 days Consults called: None Admission status: inpt, tele I certify that at the point of admission it is my clinical judgment that the patient will require inpatient hospital care spanning beyond 2 midnights from the point of admission due to high intensity of service, high risk for further deterioration and high frequency of surveillance required.    Bethena Roys MD [facility]  12/03/2020, 6:30 PM

## 2020-12-03 NOTE — ED Triage Notes (Addendum)
Pt sent from [facility] due to decrease O2 sats. Pt covid +. Reported sats 71 % on RA. Pt brought in with sats 95% on 6 L [place]. Poor po intake

## 2020-12-04 ENCOUNTER — Other Ambulatory Visit: Payer: Self-pay

## 2020-12-04 DIAGNOSIS — E1159 Type 2 diabetes mellitus with other circulatory complications: Secondary | ICD-10-CM

## 2020-12-04 DIAGNOSIS — I5042 Chronic combined systolic (congestive) and diastolic (congestive) heart failure: Secondary | ICD-10-CM

## 2020-12-04 DIAGNOSIS — R569 Unspecified convulsions: Secondary | ICD-10-CM

## 2020-12-04 DIAGNOSIS — J9601 Acute respiratory failure with hypoxia: Secondary | ICD-10-CM

## 2020-12-04 DIAGNOSIS — N1832 Chronic kidney disease, stage 3b: Secondary | ICD-10-CM

## 2020-12-04 DIAGNOSIS — I480 Paroxysmal atrial fibrillation: Secondary | ICD-10-CM

## 2020-12-04 LAB — CBC WITH DIFFERENTIAL/PLATELET
Abs Immature Granulocytes: 0.01 10*3/uL (ref 0.00–0.07)
Basophils Absolute: 0 10*3/uL (ref 0.0–0.1)
Basophils Relative: 0 %
Eosinophils Absolute: 0 10*3/uL (ref 0.0–0.5)
Eosinophils Relative: 0 %
HCT: 32.7 % — ABNORMAL LOW (ref 36.0–46.0)
Hemoglobin: 9.8 g/dL — ABNORMAL LOW (ref 12.0–15.0)
Immature Granulocytes: 0 %
Lymphocytes Relative: 13 %
Lymphs Abs: 0.4 10*3/uL — ABNORMAL LOW (ref 0.7–4.0)
MCH: 28.1 pg (ref 26.0–34.0)
MCHC: 30 g/dL (ref 30.0–36.0)
MCV: 93.7 fL (ref 80.0–100.0)
Monocytes Absolute: 0.1 10*3/uL (ref 0.1–1.0)
Monocytes Relative: 2 %
Neutro Abs: 2.7 10*3/uL (ref 1.7–7.7)
Neutrophils Relative %: 85 %
Platelets: 289 10*3/uL (ref 150–400)
RBC: 3.49 MIL/uL — ABNORMAL LOW (ref 3.87–5.11)
RDW: 13.4 % (ref 11.5–15.5)
WBC: 3.2 10*3/uL — ABNORMAL LOW (ref 4.0–10.5)
nRBC: 0 % (ref 0.0–0.2)

## 2020-12-04 LAB — FERRITIN: Ferritin: 676 ng/mL — ABNORMAL HIGH (ref 11–307)

## 2020-12-04 LAB — COMPREHENSIVE METABOLIC PANEL
ALT: 10 U/L (ref 0–44)
AST: 13 U/L — ABNORMAL LOW (ref 15–41)
Albumin: 2.1 g/dL — ABNORMAL LOW (ref 3.5–5.0)
Alkaline Phosphatase: 51 U/L (ref 38–126)
Anion gap: 16 — ABNORMAL HIGH (ref 5–15)
BUN: 43 mg/dL — ABNORMAL HIGH (ref 6–20)
CO2: 18 mmol/L — ABNORMAL LOW (ref 22–32)
Calcium: 8.4 mg/dL — ABNORMAL LOW (ref 8.9–10.3)
Chloride: 101 mmol/L (ref 98–111)
Creatinine, Ser: 1.83 mg/dL — ABNORMAL HIGH (ref 0.44–1.00)
GFR, Estimated: 32 mL/min — ABNORMAL LOW (ref >60)
Glucose, Bld: 391 mg/dL — ABNORMAL HIGH (ref 70–99)
Potassium: 4.5 mmol/L (ref 3.5–5.1)
Sodium: 135 mmol/L (ref 135–145)
Total Bilirubin: 1.3 mg/dL — ABNORMAL HIGH (ref 0.3–1.2)
Total Protein: 6.6 g/dL (ref 6.5–8.1)

## 2020-12-04 LAB — MAGNESIUM: Magnesium: 2 mg/dL (ref 1.7–2.4)

## 2020-12-04 LAB — GLUCOSE, CAPILLARY
Glucose-Capillary: 367 mg/dL — ABNORMAL HIGH (ref 70–99)
Glucose-Capillary: 362 mg/dL — ABNORMAL HIGH (ref 70–99)
Glucose-Capillary: 364 mg/dL — ABNORMAL HIGH (ref 70–99)
Glucose-Capillary: 469 mg/dL — ABNORMAL HIGH (ref 70–99)

## 2020-12-04 LAB — D-DIMER, QUANTITATIVE: D-Dimer, Quant: 1.33 ug/mL-FEU — ABNORMAL HIGH (ref 0.00–0.50)

## 2020-12-04 LAB — PHOSPHORUS: Phosphorus: 6 mg/dL — ABNORMAL HIGH (ref 2.5–4.6)

## 2020-12-04 LAB — C-REACTIVE PROTEIN: CRP: 34.3 mg/dL — ABNORMAL HIGH (ref <1.0)

## 2020-12-04 MED ORDER — INSULIN ASPART 100 UNIT/ML ~~LOC~~ SOLN
5.0000 [IU] | Freq: Three times a day (TID) | SUBCUTANEOUS | Status: DC
  Administered 2020-12-04: 5 [IU] via SUBCUTANEOUS

## 2020-12-04 MED ORDER — INSULIN GLARGINE 100 UNIT/ML ~~LOC~~ SOLN
10.0000 [IU] | Freq: Two times a day (BID) | SUBCUTANEOUS | Status: DC
  Administered 2020-12-04: 10 [IU] via SUBCUTANEOUS
  Filled 2020-12-04 (×3): qty 0.1

## 2020-12-04 MED ORDER — LORAZEPAM 0.5 MG PO TABS
0.5000 mg | ORAL_TABLET | Freq: Three times a day (TID) | ORAL | Status: DC | PRN
  Administered 2020-12-04 – 2020-12-06 (×4): 0.5 mg via ORAL
  Filled 2020-12-04 (×4): qty 1

## 2020-12-04 MED ORDER — INSULIN GLARGINE 100 UNIT/ML ~~LOC~~ SOLN
15.0000 [IU] | Freq: Two times a day (BID) | SUBCUTANEOUS | Status: DC
  Administered 2020-12-04: 15 [IU] via SUBCUTANEOUS
  Filled 2020-12-04 (×2): qty 0.15

## 2020-12-04 NOTE — Progress Notes (Signed)
PROGRESS NOTE    [name]  ZOX:[id number] DOB: Oct 13, 1966 DOA: 12/03/2020 PCP: Caprice Renshaw, MD   Chief Complaint  Patient presents with  . Covid Positive    Brief admission Narrative:  As per H&P written by Dr. Denton Brick on 12/03/2020 [name] is a 55 y.o. female with medical history significant for diastolic and systolic CHF, diabetes mellitus, COPD, atrial fibrillation on chronic anticoagulation, hypertension, stroke, seizures. Patient was brought to the ED from nursing home reports of low oxygen level.  Patient tested positive for COVID, and had been on the COVID floor for about 1 - 2 weeks.  She was treated with azithomycin, steroids, augmentin and breathing treatments. Patient does not remember what date she tested positive. She is unvaccinated.  O2 sats were noted to be 71% on room air she was placed on 6 L. She was noted to have decreased oral intake today. She denies vomiting or loose stools.  To me, patient endorses cough, but she denies difficulty breathing at rest.  Hospitalization 1/4 - 1/20  atrial fibrillation with RVR, GI bleed, DKA, and C. difficile positive diarrhea.   She was subequently discharged to SNF.   ED Course: ED O2 sats were 85% on 2 L, patient is currently on 6 L.  Will treat 17-22.  Heart rate 90s.  Blood pressure systolic 81-191.  Lactic acid 1.2.  D-dimer 1.46.  Procalcitonin less than 0.1.  Elevation in inflammatory markers and CRP 33.5.  Chest x-ray shows bilateral interstitial and patchy alveolar airspace opacities concerning for multilobar pneumonia, including atypical or viral.  Assessment & Plan: 1-acute respiratory failure with hypoxia secondary to pneumonia due to COVID-19 virus -Continue to follow inflammatory markers -Patient treated with Actemra on 12/03/20 -Continue steroids -Out of window for remdesivir infusions. -Continue vitamin C, zinc, incentive respirometer, flutter valve, proning positioning and as needed  bronchodilators. -Continue also antitussive and mucolytic's medication. -Wean off oxygen supplementation as tolerated.  2-paroxysmal atrial fibrillation -Heart rate fluctuating; during my examination appears to be controlled -Continue metoprolol -Continue Eliquis for secondary prevention.  3-Essential hypertension -Stable overall -Continue to follow vital signs -Continue current regimen.  4-depression/anxiety -Continue Cymbalta -Low-dose Ativan thoraces with anxious mood started.  5-CKD (chronic kidney disease) stage 3, GFR 30-59 ml/min (HCC) -Stage IIIb at baseline -Continue to closely follow renal function -Maintain adequate hydration -Minimize nephrotoxic agents and avoid hypotension and contrast.  6-Chronic combined systolic and diastolic CHF (congestive heart failure) (HCC) -Appears to be compensated -Continue to follow low-sodium diet, strict I's and O's and daily weights. -Continue beta-blocker -No ACE inhibitors or ARB in the setting of renal failure.  7-Type 2 diabetes mellitus with vascular disease and hyperglycemia (HCC) -Steroids contributing to patient's elevated CBG -Continue sliding scale insulin and adjusted dose of Lantus -Will also add NovoLog for meal coverage 3 times daily. -Follow CBGs and adjust hypoglycemic regimen as required.  8-Seizures ([place]) -No seizure activity appreciated currently -Continue the use of Keppra  9-hyperlipidemia -Continue statins.  10-GERD/GI prophylaxis -Continue PPI.   DVT prophylaxis: chronically on eliquis. Code Status: Full code Family Communication: no family at bedside; unable to reach son or daughter over the phone. Disposition:   Status is: Inpatient  Dispo: The patient is from: [facility]              Anticipated d/c is to: SNF              Anticipated d/c date is: To be determined.  Patient currently no medically stable for discharge; still experiencing desaturation with  minimal activity, requiring 4-6 L nasal cannula supplementation and complaining of intermittent coughing spells.  Continue treatment with the steroids, bronchodilators and supportive care.  Patient has received Actemra on 12/03/20.  No remdesivir as she has had symptoms and positive test for over 10 days prior to admission.    Difficult to place patient: no    Consultants:   None   Procedures:  See below for x-ray reports.   Antimicrobials:  None   Subjective: Desaturating with activity and complaining of short winded sensation on exertion.  No chest pain, no nausea, no vomiting.  Hyperglycemia appreciated with the steroids management.  Patient denies chest pain, nausea or vomiting.  Expressed feeling weak, tired with decreased appetite.  Using 4-6 L nasal supplementation currently.  Objective: Vitals:   12/04/20 0243 12/04/20 0801 12/04/20 1000 12/04/20 1352  BP:   104/81   Pulse:   81   Resp:   (!) 21   Temp:      TempSrc:      SpO2: (!) 88% (!) 89% 92% 92%  Weight:      Height:        Intake/Output Summary (Last 24 hours) at 12/04/2020 1426 Last data filed at 12/04/2020 0400 Gross per 24 hour  Intake 418.87 ml  Output -  Net 418.87 ml   Filed Weights   12/03/20 1323  Weight: 78.9 kg    Examination:  General exam: Appears calm and afebrile; reports intermittent coughing spells, short winded sensation with minimal activity and having mild difficulty speaking in full sentences.  Patient expressed decreased appetite and feeling weak.  Requiring 4-6 L nasal cannula supplementation. Respiratory system: Scattered rhonchi bilaterally; no wheezing, no crackles on exam.  No [name] use of accessory muscles appreciated during examination Cardiovascular system: S1 & S2 heard, RRR. No JVD, murmurs, rubs, gallops or clicks. No pedal edema. Gastrointestinal system: Abdomen is nondistended, soft and nontender. No organomegaly or masses felt. Normal bowel sounds heard. Central nervous  system: Alert and oriented. No focal neurological deficits. Extremities: No cyanosis or clubbing Skin: No petechiae. Psychiatry: Mood & affect appropriate.     Data Reviewed: I have personally reviewed following labs and imaging studies  CBC: Recent Labs  Lab 12/03/20 1432 12/04/20 0737  WBC 7.4 3.2*  NEUTROABS 5.9 2.7  HGB 10.1* 9.8*  HCT 32.5* 32.7*  MCV 93.1 93.7  PLT 298 638    Basic Metabolic Panel: Recent Labs  Lab 12/03/20 1432 12/04/20 0737  NA 133* 135  K 3.6 4.5  CL 97* 101  CO2 24 18*  GLUCOSE 253* 391*  BUN 34* 43*  CREATININE 1.83* 1.83*  CALCIUM 8.6* 8.4*  MG  --  2.0  PHOS  --  6.0*    GFR: Estimated Creatinine Clearance: 32.6 mL/min (A) (by C-G formula based on SCr of 1.83 mg/dL (H)).  Liver Function Tests: Recent Labs  Lab 12/03/20 1432 12/04/20 0737  AST 10* 13*  ALT 8 10  ALKPHOS 57 51  BILITOT 0.8 1.3*  PROT 7.2 6.6  ALBUMIN 2.3* 2.1*    CBG: Recent Labs  Lab 12/03/20 2225 12/04/20 0757 12/04/20 1125  GLUCAP 277* 367* 362*     Recent Results (from the past 240 hour(s))  Blood Culture (routine x 2)     Status: None (Preliminary result)   Collection Time: 12/03/20  2:32 PM   Specimen: BLOOD  Result Value Ref Range Status  Specimen Description BLOOD RIGHT HAND  Final   Special Requests   Final    BOTTLES DRAWN AEROBIC AND ANAEROBIC Blood Culture adequate volume   Culture   Final    NO GROWTH < 24 HOURS Performed at [facility], [address] [place], [place] [address]    Report Status PENDING  Incomplete  Blood Culture (routine x 2)     Status: None (Preliminary result)   Collection Time: 12/03/20  2:32 PM   Specimen: BLOOD  Result Value Ref Range Status   Specimen Description BLOOD RIGHT ANTECUBITAL  Final   Special Requests   Final    BOTTLES DRAWN AEROBIC AND ANAEROBIC Blood Culture adequate volume   Culture   Final    NO GROWTH < 24 HOURS Performed at [facility], [address], [place],   [address]    Report Status PENDING  Incomplete     Radiology Studies: DG Chest Port 1 View  Result Date: 12/03/2020 CLINICAL DATA:  Shortness of breath EXAM: PORTABLE CHEST 1 VIEW COMPARISON:  11/02/2020 FINDINGS: Bilateral interstitial and patchy alveolar airspace opacities. No pleural effusion or pneumothorax. Stable cardiomegaly. No acute osseous abnormality. IMPRESSION: Bilateral interstitial and patchy alveolar airspace opacities concerning for multilobar pneumonia including atypical viral pneumonia. Electronically Signed   By: Kathreen Devoid   On: 12/03/2020 14:37    Scheduled Meds: . albuterol  2 puff Inhalation Q6H  . apixaban  5 mg Oral BID  . vitamin C  500 mg Oral Daily  . DULoxetine  60 mg Oral Daily  . gabapentin  300 mg Oral TID  . insulin aspart  0-15 Units Subcutaneous TID WC  . insulin aspart  0-5 Units Subcutaneous QHS  . insulin aspart  5 Units Subcutaneous TID WC  . insulin glargine  15 Units Subcutaneous BID  . levETIRAcetam  750 mg Oral BID  . methylPREDNISolone (SOLU-MEDROL) injection  0.5 mg/kg Intravenous Q12H   Followed by  . [START ON 12/07/2020] predniSONE  50 mg Oral Daily  . metoprolol succinate  50 mg Oral BID  . montelukast  10 mg Oral QHS  . oxybutynin  5 mg Oral QHS  . pantoprazole  40 mg Oral BID  . rosuvastatin  10 mg Oral Daily  . zinc sulfate  220 mg Oral Daily   Continuous Infusions:   LOS: 1 day    Time spent: 35 minutes.    Barton Dubois, MD [facility]   To contact the attending provider between 7A-7P or the covering provider during after hours 7P-7A, please log into the web site [email or web address] and access using universal [place] password for that web site. If you do not have the password, please call the hospital operator.  12/04/2020, 2:26 PM

## 2020-12-04 NOTE — Progress Notes (Signed)
Pt began this am confused, oriented only to person. As day has progressed, pt cognition has greatly improved. Now alert and oriented x4, talkative, cooperative and expresses gratitude for staff member's care and assistance. Pt did complain of feeling anxious today, MD ordered and pt was given po Ativan. Pt states she feels much more relaxed now. No SOB, occasional congested but non-productive cough. O2 remains at 3 lpm. SaO2  > 90%.  HR early this am 80 - 140 bpm. Pt given ordered lopressor and heart rate has since remained 80 -100 bpm, still having PVC's and questionable episodes of a-fib per central tele, but pt is asymptomatic. Pt has eaten well today, drinking fluids without n/v. Small smear of pasty stool noted this afternoon, but otherwise no BM. Urine output > 800 ml this shift via purewick. Update given to patient's sister [name] via phone with patient's permission.

## 2020-12-04 NOTE — Progress Notes (Signed)
Central telemetry reports pt's HR 80 - 150's, SR - ST with occasional PAC's and also 4 beat run of V-tach very early this am. Pt alert, oriented to person only, no SOB noted. Skin warm and dry. B/P ranging from 74/48 to 104/81, reading varies with heart rate. MD [facility] notified via page regarding both HR and b/p, requested guidance on admin of scheduled oral lopressor due to labile B/P. Waiting on response.

## 2020-12-04 NOTE — Progress Notes (Signed)
MD notified of CBG 469. Awaiting new orders. Pt education given as pt stated "it's only high because of the steroids". Advised pt on dietary restrictions as she was drinking regular juice.

## 2020-12-04 NOTE — Progress Notes (Signed)
Spoke with MD Dyann Kief in-person, states OK to admin scheduled lopressor.

## 2020-12-04 NOTE — Progress Notes (Signed)
Pt had o2 off when I came in room to doo inhaler spo2 83% on room air o2 placed back on 2lpm cann spo2 88% increased to 3lpm spo2 90%

## 2020-12-05 DIAGNOSIS — J1282 Pneumonia due to coronavirus disease 2019: Secondary | ICD-10-CM

## 2020-12-05 DIAGNOSIS — U071 COVID-19: Principal | ICD-10-CM

## 2020-12-05 LAB — COMPREHENSIVE METABOLIC PANEL
ALT: 10 U/L (ref 0–44)
AST: 10 U/L — ABNORMAL LOW (ref 15–41)
Albumin: 2.1 g/dL — ABNORMAL LOW (ref 3.5–5.0)
Alkaline Phosphatase: 50 U/L (ref 38–126)
Anion gap: 6 (ref 5–15)
BUN: 40 mg/dL — ABNORMAL HIGH (ref 6–20)
CO2: 24 mmol/L (ref 22–32)
Calcium: 8.4 mg/dL — ABNORMAL LOW (ref 8.9–10.3)
Chloride: 102 mmol/L (ref 98–111)
Creatinine, Ser: 1.69 mg/dL — ABNORMAL HIGH (ref 0.44–1.00)
GFR, Estimated: 35 mL/min — ABNORMAL LOW (ref >60)
Glucose, Bld: 501 mg/dL (ref 70–99)
Potassium: 4 mmol/L (ref 3.5–5.1)
Sodium: 132 mmol/L — ABNORMAL LOW (ref 135–145)
Total Bilirubin: 0.5 mg/dL (ref 0.3–1.2)
Total Protein: 6.5 g/dL (ref 6.5–8.1)

## 2020-12-05 LAB — CBC WITH DIFFERENTIAL/PLATELET
Abs Immature Granulocytes: 0.03 10*3/uL (ref 0.00–0.07)
Basophils Absolute: 0 10*3/uL (ref 0.0–0.1)
Basophils Relative: 0 %
Eosinophils Absolute: 0 10*3/uL (ref 0.0–0.5)
Eosinophils Relative: 0 %
HCT: 31.3 % — ABNORMAL LOW (ref 36.0–46.0)
Hemoglobin: 9.7 g/dL — ABNORMAL LOW (ref 12.0–15.0)
Immature Granulocytes: 1 %
Lymphocytes Relative: 7 %
Lymphs Abs: 0.3 10*3/uL — ABNORMAL LOW (ref 0.7–4.0)
MCH: 28.3 pg (ref 26.0–34.0)
MCHC: 31 g/dL (ref 30.0–36.0)
MCV: 91.3 fL (ref 80.0–100.0)
Monocytes Absolute: 0.1 10*3/uL (ref 0.1–1.0)
Monocytes Relative: 2 %
Neutro Abs: 4.4 10*3/uL (ref 1.7–7.7)
Neutrophils Relative %: 90 %
Platelets: 383 10*3/uL (ref 150–400)
RBC: 3.43 MIL/uL — ABNORMAL LOW (ref 3.87–5.11)
RDW: 13.2 % (ref 11.5–15.5)
WBC: 4.9 10*3/uL (ref 4.0–10.5)
nRBC: 0 % (ref 0.0–0.2)

## 2020-12-05 LAB — C-REACTIVE PROTEIN: CRP: 19.5 mg/dL — ABNORMAL HIGH (ref <1.0)

## 2020-12-05 LAB — GLUCOSE, RANDOM: Glucose, Bld: 561 mg/dL (ref 70–99)

## 2020-12-05 LAB — GLUCOSE, CAPILLARY
Glucose-Capillary: 420 mg/dL — ABNORMAL HIGH (ref 70–99)
Glucose-Capillary: 480 mg/dL — ABNORMAL HIGH (ref 70–99)
Glucose-Capillary: 507 mg/dL (ref 70–99)
Glucose-Capillary: 353 mg/dL — ABNORMAL HIGH (ref 70–99)
Glucose-Capillary: 252 mg/dL — ABNORMAL HIGH (ref 70–99)

## 2020-12-05 LAB — D-DIMER, QUANTITATIVE: D-Dimer, Quant: 1.11 ug/mL-FEU — ABNORMAL HIGH (ref 0.00–0.50)

## 2020-12-05 LAB — MAGNESIUM: Magnesium: 1.9 mg/dL (ref 1.7–2.4)

## 2020-12-05 LAB — FERRITIN: Ferritin: 780 ng/mL — ABNORMAL HIGH (ref 11–307)

## 2020-12-05 LAB — PHOSPHORUS: Phosphorus: 3.1 mg/dL (ref 2.5–4.6)

## 2020-12-05 MED ORDER — INSULIN GLARGINE 100 UNIT/ML ~~LOC~~ SOLN
24.0000 [IU] | Freq: Two times a day (BID) | SUBCUTANEOUS | Status: DC
  Administered 2020-12-05 – 2020-12-07 (×4): 24 [IU] via SUBCUTANEOUS
  Filled 2020-12-05 (×8): qty 0.24

## 2020-12-05 MED ORDER — INSULIN ASPART 100 UNIT/ML ~~LOC~~ SOLN
0.0000 [IU] | Freq: Every day | SUBCUTANEOUS | Status: DC
  Administered 2020-12-05: 3 [IU] via SUBCUTANEOUS
  Administered 2020-12-06: 2 [IU] via SUBCUTANEOUS

## 2020-12-05 MED ORDER — ALBUTEROL SULFATE HFA 108 (90 BASE) MCG/ACT IN AERS
2.0000 | INHALATION_SPRAY | Freq: Two times a day (BID) | RESPIRATORY_TRACT | Status: DC
  Administered 2020-12-05 – 2020-12-07 (×4): 2 via RESPIRATORY_TRACT

## 2020-12-05 MED ORDER — TRAZODONE HCL 50 MG PO TABS
50.0000 mg | ORAL_TABLET | Freq: Every day | ORAL | Status: DC
  Administered 2020-12-05 – 2020-12-06 (×2): 50 mg via ORAL
  Filled 2020-12-05 (×2): qty 1

## 2020-12-05 MED ORDER — INSULIN GLARGINE 100 UNIT/ML ~~LOC~~ SOLN
20.0000 [IU] | Freq: Two times a day (BID) | SUBCUTANEOUS | Status: DC
  Administered 2020-12-05: 20 [IU] via SUBCUTANEOUS
  Filled 2020-12-05 (×3): qty 0.2

## 2020-12-05 MED ORDER — INSULIN ASPART 100 UNIT/ML ~~LOC~~ SOLN
6.0000 [IU] | Freq: Three times a day (TID) | SUBCUTANEOUS | Status: DC
  Administered 2020-12-05 – 2020-12-07 (×5): 6 [IU] via SUBCUTANEOUS

## 2020-12-05 MED ORDER — INSULIN ASPART 100 UNIT/ML ~~LOC~~ SOLN
35.0000 [IU] | Freq: Once | SUBCUTANEOUS | Status: AC
  Administered 2020-12-05: 35 [IU] via SUBCUTANEOUS

## 2020-12-05 MED ORDER — INSULIN ASPART 100 UNIT/ML ~~LOC~~ SOLN
0.0000 [IU] | Freq: Three times a day (TID) | SUBCUTANEOUS | Status: DC
  Administered 2020-12-05: 20 [IU] via SUBCUTANEOUS
  Administered 2020-12-06: 15 [IU] via SUBCUTANEOUS
  Administered 2020-12-06: 7 [IU] via SUBCUTANEOUS
  Administered 2020-12-06: 15 [IU] via SUBCUTANEOUS
  Administered 2020-12-07: 7 [IU] via SUBCUTANEOUS

## 2020-12-05 MED ORDER — INSULIN ASPART 100 UNIT/ML ~~LOC~~ SOLN
40.0000 [IU] | Freq: Once | SUBCUTANEOUS | Status: AC
  Administered 2020-12-05: 40 [IU] via SUBCUTANEOUS

## 2020-12-05 NOTE — Progress Notes (Signed)
MD notified of current CBG 420
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
## 2020-12-05 NOTE — Progress Notes (Signed)
Accucheck is 20, MD Courage notified by Amion.

## 2020-12-05 NOTE — Progress Notes (Signed)
PROGRESS NOTE    [name]  ION:[id number] DOB: 07/04/1966 DOA: 12/03/2020 PCP: Caprice Renshaw, MD   Chief Complaint  Patient presents with  . Covid Positive    Brief admission Narrative:  As per H&P written by Dr. Denton Brick on 12/03/2020 [name] is a 55 y.o. female with medical history significant for diastolic and systolic CHF, diabetes mellitus, COPD, atrial fibrillation on chronic anticoagulation, hypertension, stroke, seizures. Patient was brought to the ED from nursing home reports of low oxygen level.  Patient tested positive for COVID, and had been on the COVID floor for about 1 - 2 weeks.  She was treated with azithomycin, steroids, augmentin and breathing treatments. Patient does not remember what date she tested positive. She is unvaccinated.  O2 sats were noted to be 71% on room air she was placed on 6 L. She was noted to have decreased oral intake today. She denies vomiting or loose stools.  To me, patient endorses cough, but she denies difficulty breathing at rest.  Hospitalization 1/4 - 1/20  atrial fibrillation with RVR, GI bleed, DKA, and C. difficile positive diarrhea.   She was subequently discharged to SNF.   ED Course: ED O2 sats were 85% on 2 L, patient is currently on 6 L.  Will treat 17-22.  Heart rate 90s.  Blood pressure systolic 24-401.  Lactic acid 1.2.  D-dimer 1.46.  Procalcitonin less than 0.1.  Elevation in inflammatory markers and CRP 33.5.  Chest x-ray shows bilateral interstitial and patchy alveolar airspace opacities concerning for multilobar pneumonia, including atypical or viral.  Assessment & Plan: 1-acute respiratory failure with hypoxia secondary to pneumonia due to COVID-19 virus -Continue to follow inflammatory markers -Patient treated with Actemra on 12/03/20 -Continue steroids -Out of window for remdesivir infusions. -Continue vitamin C, zinc, incentive respirometer, flutter valve, proning positioning and as needed  bronchodilators. -Continue also antitussive and mucolytic's medication. -Wean off oxygen supplementation as tolerated.  2-paroxysmal atrial fibrillation -Heart rate fluctuating; during my examination appears to be controlled -Continue metoprolol -Continue Eliquis for secondary prevention.  3-Essential hypertension -Stable overall -Continue to follow vital signs -Continue current regimen.  4-depression/anxiety -Continue Cymbalta -Low-dose Ativan thoraces with anxious mood started.  5-CKD (chronic kidney disease) stage 3, GFR 30-59 ml/min (HCC) -Stage IIIb at baseline -Continue to closely follow renal function -Maintain adequate hydration -Minimize nephrotoxic agents and avoid hypotension and contrast.  6-Chronic combined systolic and diastolic CHF (congestive heart failure) (HCC) -Appears to be compensated -Continue to follow low-sodium diet, strict I's and O's and daily weights. -Continue beta-blocker -No ACE inhibitors or ARB in the setting of renal failure.  7-Type 2 diabetes mellitus with vascular disease and hyperglycemia (HCC) -Severely elevated blood sugars noted in the setting of steroid therapy -Insulin therapy adjusted further -Continue sliding scale insulin and adjusted dose of Lantus -Will also add NovoLog for meal coverage 3 times daily. -Follow CBGs and adjust hypoglycemic regimen as required.  8-Seizures ([place]) -No seizure activity appreciated currently -Continue the use of Keppra  9-hyperlipidemia -Continue statins.  10-GERD/GI prophylaxis -Continue PPI.   DVT prophylaxis: chronically on eliquis. Code Status: Full code Family Communication: no family at bedside; unable to reach son or daughter over the phone. Disposition:   Status is: Inpatient  Dispo: The patient is from: [facility])              Anticipated d/c is to: SNF              Anticipated d/c  date is: To be determined.              Patient currently no medically  stable for discharge; still experiencing desaturation with minimal activity, requiring 4-6 L nasal cannula supplementation and complaining of intermittent coughing spells.  Continue treatment with the steroids, bronchodilators and supportive care.  Patient has received Actemra on 12/03/20.  No remdesivir as she has had symptoms and positive test for over 10 days prior to admission.    Difficult to place patient: no    Consultants:   None   Procedures:  See below for x-ray reports.   Antimicrobials:  None   Subjective:  -Severely elevated blood sugars noted in the setting of steroid therapy -Insulin therapy adjusted further  -Hypoxia with activity persist  Objective: Vitals:   12/05/20 0521 12/05/20 0751 12/05/20 0942 12/05/20 1420  BP: (!) 130/91  (!) 131/97 130/90  Pulse: 71  77 76  Resp: 18  20   Temp: 98.2 F (36.8 C)   98 F (36.7 C)  TempSrc: Oral     SpO2: 92% 92% 90% 94%  Weight:      Height:        Intake/Output Summary (Last 24 hours) at 12/05/2020 1550 Last data filed at 12/05/2020 1121 Gross per 24 hour  Intake 600 ml  Output 1750 ml  Net -1150 ml   Filed Weights   12/03/20 1323  Weight: 78.9 kg    Examination:   Physical Exam  Gen:- Awake Alert, no conversational dyspnea HEENT:- [place].AT, No sclera icterus Nose - [place]  4L/min Neck-Supple Neck,No JVD,.  Lungs-fair air movement, no wheezing  CV- S1, S2 normal Abd-  +ve B.Sounds, Abd Soft, No tenderness,    Extremity/Skin:- No  edema,   Psych-affect is appropriate, oriented x3 Neuro-no new focal deficits, no tremors     Data Reviewed: I have personally reviewed following labs and imaging studies  CBC: Recent Labs  Lab 12/03/20 1432 12/04/20 0737 12/05/20 0501  WBC 7.4 3.2* 4.9  NEUTROABS 5.9 2.7 4.4  HGB 10.1* 9.8* 9.7*  HCT 32.5* 32.7* 31.3*  MCV 93.1 93.7 91.3  PLT 298 289 786    Basic Metabolic Panel: Recent Labs  Lab 12/03/20 1432 12/04/20 0737 12/05/20 0501 12/05/20 1152   NA 133* 135 132*  --   K 3.6 4.5 4.0  --   CL 97* 101 102  --   CO2 24 18* 24  --   GLUCOSE 253* 391* 501* 561*  BUN 34* 43* 40*  --   CREATININE 1.83* 1.83* 1.69*  --   CALCIUM 8.6* 8.4* 8.4*  --   MG  --  2.0 1.9  --   PHOS  --  6.0* 3.1  --     GFR: Estimated Creatinine Clearance: 35.3 mL/min (A) (by C-G formula based on SCr of 1.69 mg/dL (H)).  Liver Function Tests: Recent Labs  Lab 12/03/20 1432 12/04/20 0737 12/05/20 0501  AST 10* 13* 10*  ALT 8 10 10   ALKPHOS 57 51 50  BILITOT 0.8 1.3* 0.5  PROT 7.2 6.6 6.5  ALBUMIN 2.3* 2.1* 2.1*    CBG: Recent Labs  Lab 12/04/20 1706 12/04/20 2132 12/05/20 0101 12/05/20 0814 12/05/20 1124  GLUCAP 364* 469* 420* 480* 507*     Recent Results (from the past 240 hour(s))  Blood Culture (routine x 2)     Status: None (Preliminary result)   Collection Time: 12/03/20  2:32 PM   Specimen: BLOOD  Result Value  Ref Range Status   Specimen Description BLOOD RIGHT HAND  Final   Special Requests   Final    BOTTLES DRAWN AEROBIC AND ANAEROBIC Blood Culture adequate volume   Culture   Final    NO GROWTH 2 DAYS Performed at [facility], [address] [place], [place] [address]    Report Status PENDING  Incomplete  Blood Culture (routine x 2)     Status: None (Preliminary result)   Collection Time: 12/03/20  2:32 PM   Specimen: BLOOD  Result Value Ref Range Status   Specimen Description BLOOD RIGHT ANTECUBITAL  Final   Special Requests   Final    BOTTLES DRAWN AEROBIC AND ANAEROBIC Blood Culture adequate volume   Culture   Final    NO GROWTH 2 DAYS Performed at [facility], [address] [place], [place] [address]    Report Status PENDING  Incomplete     Radiology Studies: No results found.  Scheduled Meds: . albuterol  2 puff Inhalation BID  . apixaban  5 mg Oral BID  . vitamin C  500 mg Oral Daily  . DULoxetine  60 mg Oral Daily  . gabapentin  300 mg Oral TID  . insulin aspart  0-20 Units Subcutaneous TID WC   . insulin aspart  0-5 Units Subcutaneous QHS  . insulin aspart  6 Units Subcutaneous TID WC  . insulin glargine  24 Units Subcutaneous BID  . levETIRAcetam  750 mg Oral BID  . methylPREDNISolone (SOLU-MEDROL) injection  0.5 mg/kg Intravenous Q12H   Followed by  . [START ON 12/07/2020] predniSONE  50 mg Oral Daily  . metoprolol succinate  50 mg Oral BID  . montelukast  10 mg Oral QHS  . oxybutynin  5 mg Oral QHS  . pantoprazole  40 mg Oral BID  . rosuvastatin  10 mg Oral Daily  . traZODone  50 mg Oral QHS  . zinc sulfate  220 mg Oral Daily   Continuous Infusions:   LOS: 2 days   Roxan Hockey, MD [facility]   To contact the attending provider between 7A-7P or the covering provider during after hours 7P-7A, please log into the web site [email or web address] and access using universal [place] password for that web site. If you do not have the password, please call the hospital operator.  12/05/2020, 3:50 PM

## 2020-12-05 NOTE — Progress Notes (Signed)
CRITICAL VALUE ALERT  Critical Value:glucose 501 mg/dl  Date & Time Notified: 12/05/2020 @ 0750  Provider Notified: 12/05/2020 @ 0755 - MD Courage  Orders Received/Actions taken: awaiting orders

## 2020-12-06 LAB — CBC WITH DIFFERENTIAL/PLATELET
Abs Immature Granulocytes: 0.06 10*3/uL (ref 0.00–0.07)
Basophils Absolute: 0 10*3/uL (ref 0.0–0.1)
Basophils Relative: 0 %
Eosinophils Absolute: 0 10*3/uL (ref 0.0–0.5)
Eosinophils Relative: 0 %
HCT: 33.4 % — ABNORMAL LOW (ref 36.0–46.0)
Hemoglobin: 10.4 g/dL — ABNORMAL LOW (ref 12.0–15.0)
Immature Granulocytes: 1 %
Lymphocytes Relative: 6 %
Lymphs Abs: 0.5 10*3/uL — ABNORMAL LOW (ref 0.7–4.0)
MCH: 28.4 pg (ref 26.0–34.0)
MCHC: 31.1 g/dL (ref 30.0–36.0)
MCV: 91.3 fL (ref 80.0–100.0)
Monocytes Absolute: 0.2 10*3/uL (ref 0.1–1.0)
Monocytes Relative: 2 %
Neutro Abs: 7.5 10*3/uL (ref 1.7–7.7)
Neutrophils Relative %: 91 %
Platelets: 430 10*3/uL — ABNORMAL HIGH (ref 150–400)
RBC: 3.66 MIL/uL — ABNORMAL LOW (ref 3.87–5.11)
RDW: 13.2 % (ref 11.5–15.5)
WBC: 8.2 10*3/uL (ref 4.0–10.5)
nRBC: 0 % (ref 0.0–0.2)

## 2020-12-06 LAB — D-DIMER, QUANTITATIVE: D-Dimer, Quant: 0.78 ug/mL-FEU — ABNORMAL HIGH (ref 0.00–0.50)

## 2020-12-06 LAB — GLUCOSE, CAPILLARY
Glucose-Capillary: 233 mg/dL — ABNORMAL HIGH (ref 70–99)
Glucose-Capillary: 316 mg/dL — ABNORMAL HIGH (ref 70–99)
Glucose-Capillary: 327 mg/dL — ABNORMAL HIGH (ref 70–99)
Glucose-Capillary: 232 mg/dL — ABNORMAL HIGH (ref 70–99)

## 2020-12-06 LAB — PHOSPHORUS: Phosphorus: 2.2 mg/dL — ABNORMAL LOW (ref 2.5–4.6)

## 2020-12-06 LAB — COMPREHENSIVE METABOLIC PANEL
ALT: 10 U/L (ref 0–44)
AST: 12 U/L — ABNORMAL LOW (ref 15–41)
Albumin: 2.1 g/dL — ABNORMAL LOW (ref 3.5–5.0)
Alkaline Phosphatase: 50 U/L (ref 38–126)
Anion gap: 12 (ref 5–15)
BUN: 39 mg/dL — ABNORMAL HIGH (ref 6–20)
CO2: 25 mmol/L (ref 22–32)
Calcium: 9.2 mg/dL (ref 8.9–10.3)
Chloride: 102 mmol/L (ref 98–111)
Creatinine, Ser: 1.69 mg/dL — ABNORMAL HIGH (ref 0.44–1.00)
GFR, Estimated: 35 mL/min — ABNORMAL LOW (ref >60)
Glucose, Bld: 179 mg/dL — ABNORMAL HIGH (ref 70–99)
Potassium: 3.8 mmol/L (ref 3.5–5.1)
Sodium: 139 mmol/L (ref 135–145)
Total Bilirubin: 0.5 mg/dL (ref 0.3–1.2)
Total Protein: 6.4 g/dL — ABNORMAL LOW (ref 6.5–8.1)

## 2020-12-06 LAB — MAGNESIUM: Magnesium: 2.1 mg/dL (ref 1.7–2.4)

## 2020-12-06 LAB — C-REACTIVE PROTEIN: CRP: 10.1 mg/dL — ABNORMAL HIGH (ref <1.0)

## 2020-12-06 LAB — FERRITIN: Ferritin: 626 ng/mL — ABNORMAL HIGH (ref 11–307)

## 2020-12-06 MED ORDER — METOPROLOL SUCCINATE ER 50 MG PO TB24
75.0000 mg | ORAL_TABLET | Freq: Two times a day (BID) | ORAL | Status: DC
  Administered 2020-12-06 – 2020-12-07 (×2): 75 mg via ORAL
  Filled 2020-12-06 (×2): qty 1

## 2020-12-06 MED ORDER — K PHOS MONO-SOD PHOS DI & MONO 155-852-130 MG PO TABS
250.0000 mg | ORAL_TABLET | Freq: Two times a day (BID) | ORAL | Status: AC
  Administered 2020-12-06 – 2020-12-07 (×2): 250 mg via ORAL
  Filled 2020-12-06 (×2): qty 1

## 2020-12-06 NOTE — Care Management Important Message (Signed)
Important Message  Patient Details  Name: [name] MRN: [id number] Date of Birth: 01-28-1966   Medicare Important Message Given:        Tommy Medal 12/06/2020, 3:47 PM

## 2020-12-06 NOTE — Progress Notes (Signed)
PROGRESS NOTE    [name]  UMP:[id number] DOB: 04-Aug-1966 DOA: 12/03/2020 PCP: Caprice Renshaw, MD   Chief Complaint  Patient presents with  . Covid Positive    Brief admission Narrative:  As per H&P written by Dr. Denton Brick on 12/03/2020 [name] is a 55 y.o. female with medical history significant for diastolic and systolic CHF, diabetes mellitus, COPD, atrial fibrillation on chronic anticoagulation, hypertension, stroke, seizures. Patient was brought to the ED from nursing home reports of low oxygen level.  Patient tested positive for COVID, and had been on the COVID floor for about 1 - 2 weeks.  She was treated with azithomycin, steroids, augmentin and breathing treatments. Patient does not remember what date she tested positive. She is unvaccinated.  O2 sats were noted to be 71% on room air she was placed on 6 L. She was noted to have decreased oral intake today. She denies vomiting or loose stools.  To me, patient endorses cough, but she denies difficulty breathing at rest.  Hospitalization 1/4 - 1/20  atrial fibrillation with RVR, GI bleed, DKA, and C. difficile positive diarrhea.   She was subequently discharged to SNF.   ED Course: ED O2 sats were 85% on 2 L, patient is currently on 6 L.  Will treat 17-22.  Heart rate 90s.  Blood pressure systolic 40-086.  Lactic acid 1.2.  D-dimer 1.46.  Procalcitonin less than 0.1.  Elevation in inflammatory markers and CRP 33.5.  Chest x-ray shows bilateral interstitial and patchy alveolar airspace opacities concerning for multilobar pneumonia, including atypical or viral.  Assessment & Plan: 1-acute respiratory failure with hypoxia secondary to pneumonia due to COVID-19 virus -Continue to follow inflammatory markers -Patient treated with Actemra on 12/03/20 -Continue steroids -Out of window for remdesivir infusions. -Continue vitamin C, zinc, incentive respirometer, flutter valve, proning positioning and as needed  bronchodilators. -Continue also antitussive and mucolytic's medication. -Patient is now on 3 L of oxygen via nasal cannula  2-paroxysmal atrial fibrillation -Burst of A. fib with RVR persist with increased metoprolol to 75 mg twice daily -Continue Eliquis 5 mg twice daily for stroke prophylaxis   3-Essential hypertension =-Metoprolol as above  4-depression/anxiety -Continue Cymbalta -Low-dose Ativan prn  5-CKD (chronic kidney disease) stage 3, GFR 30-59 ml/min (HCC) -Stage IIIb at baseline -Maintain adequate hydration -Minimize nephrotoxic agents and avoid hypotension and contrast.  6-Chronic combined systolic and diastolic CHF (congestive heart failure) (HCC) -Appears to be compensated -Continue to follow low-sodium diet, strict I's and O's and daily weights. -Continue beta-blocker -No ACE inhibitors or ARB in the setting of renal failure.  7-Type 2 diabetes mellitus with vascular disease and hyperglycemia (HCC) -Severely elevated blood sugars noted in the setting of steroid therapy -Insulin therapy adjusted further -Continue sliding scale insulin and adjusted dose of Lantus -Will also add NovoLog for meal coverage 3 times daily. -Follow CBGs and adjust hypoglycemic regimen as required.  8-Seizures ([place]) -No seizure activity appreciated currently -Continue the use of Keppra  9-hyperlipidemia -Continue statins.  10-GERD/GI prophylaxis -Continue PPI.   DVT prophylaxis: chronically on eliquis. Code Status: Full code Family Communication: no family at bedside; unable to reach son or daughter over the phone. Disposition:   Status is: Inpatient  Dispo: The patient is from: [facility])              Anticipated d/c is to: SNF on 12/07/20--- May need oxygen              Anticipated  d/c date is: 12/07/20              Patient currently no medically stable for discharge; still experiencing desaturation with minimal activity, requiring oxygen therapy  -patient has received Actemra on 12/03/20.    Difficult to place patient: no    Consultants:   None   Procedures:  See below for x-ray reports.   Antimicrobials:  None   Subjective:  -Desats with minimal activity -burst of A. fib with RVR from time to time, No fever  Or chills   Objective: Vitals:   12/05/20 2059 12/06/20 0536 12/06/20 0722 12/06/20 1400  BP:  (!) 135/105  (!) 151/101  Pulse:  83  79  Resp:  18  18  Temp:  98.4 F (36.9 C)  97.8 F (36.6 C)  TempSrc:  Oral    SpO2: 94% 100% 99% 100%  Weight:      Height:        Intake/Output Summary (Last 24 hours) at 12/06/2020 1703 Last data filed at 12/06/2020 0545 Gross per 24 hour  Intake --  Output 500 ml  Net -500 ml   Filed Weights   12/03/20 1323  Weight: 78.9 kg    Examination:   Physical Exam  Gen:- Awake Alert, no conversational dyspnea HEENT:- [place].AT, No sclera icterus Nose - [place]  3L/min Neck-Supple Neck,No JVD,.  Lungs-fair air movement, no wheezing  CV- S1, S2 normal, irregularly irregular, Abd-  +ve B.Sounds, Abd Soft, No tenderness,    Extremity/Skin:- No  edema,   Psych-affect is appropriate, oriented x3 Neuro-no new focal deficits, no tremors     Data Reviewed: I have personally reviewed following labs and imaging studies  CBC: Recent Labs  Lab 12/03/20 1432 12/04/20 0737 12/05/20 0501 12/06/20 0353  WBC 7.4 3.2* 4.9 8.2  NEUTROABS 5.9 2.7 4.4 7.5  HGB 10.1* 9.8* 9.7* 10.4*  HCT 32.5* 32.7* 31.3* 33.4*  MCV 93.1 93.7 91.3 91.3  PLT 298 289 383 430*    Basic Metabolic Panel: Recent Labs  Lab 12/03/20 1432 12/04/20 0737 12/05/20 0501 12/05/20 1152 12/06/20 0353  NA 133* 135 132*  --  139  K 3.6 4.5 4.0  --  3.8  CL 97* 101 102  --  102  CO2 24 18* 24  --  25  GLUCOSE 253* 391* 501* 561* 179*  BUN 34* 43* 40*  --  39*  CREATININE 1.83* 1.83* 1.69*  --  1.69*  CALCIUM 8.6* 8.4* 8.4*  --  9.2  MG  --  2.0 1.9  --  2.1  PHOS  --  6.0* 3.1  --  2.2*     GFR: Estimated Creatinine Clearance: 35.3 mL/min (A) (by C-G formula based on SCr of 1.69 mg/dL (H)).  Liver Function Tests: Recent Labs  Lab 12/03/20 1432 12/04/20 0737 12/05/20 0501 12/06/20 0353  AST 10* 13* 10* 12*  ALT 8 10 10 10   ALKPHOS 57 51 50 50  BILITOT 0.8 1.3* 0.5 0.5  PROT 7.2 6.6 6.5 6.4*  ALBUMIN 2.3* 2.1* 2.1* 2.1*    CBG: Recent Labs  Lab 12/05/20 1738 12/05/20 2106 12/06/20 0806 12/06/20 1117 12/06/20 1608  GLUCAP 353* 252* 233* 316* 327*     Recent Results (from the past 240 hour(s))  Blood Culture (routine x 2)     Status: None (Preliminary result)   Collection Time: 12/03/20  2:32 PM   Specimen: BLOOD  Result Value Ref Range Status   Specimen Description BLOOD RIGHT HAND  Final   Special Requests   Final    BOTTLES DRAWN AEROBIC AND ANAEROBIC Blood Culture adequate volume   Culture   Final    NO GROWTH 3 DAYS Performed at [facility], [address] [place], [place] [address]    Report Status PENDING  Incomplete  Blood Culture (routine x 2)     Status: None (Preliminary result)   Collection Time: 12/03/20  2:32 PM   Specimen: BLOOD  Result Value Ref Range Status   Specimen Description BLOOD RIGHT ANTECUBITAL  Final   Special Requests   Final    BOTTLES DRAWN AEROBIC AND ANAEROBIC Blood Culture adequate volume   Culture   Final    NO GROWTH 3 DAYS Performed at [facility], [address], [place], [place] [address]    Report Status PENDING  Incomplete     Radiology Studies: No results found.  Scheduled Meds: . albuterol  2 puff Inhalation BID  . apixaban  5 mg Oral BID  . vitamin C  500 mg Oral Daily  . DULoxetine  60 mg Oral Daily  . gabapentin  300 mg Oral TID  . insulin aspart  0-20 Units Subcutaneous TID WC  . insulin aspart  0-5 Units Subcutaneous QHS  . insulin aspart  6 Units Subcutaneous TID WC  . insulin glargine  24 Units Subcutaneous BID  . levETIRAcetam  750 mg Oral BID  . methylPREDNISolone (SOLU-MEDROL)  injection  0.5 mg/kg Intravenous Q12H   Followed by  . [START ON 12/07/2020] predniSONE  50 mg Oral Daily  . metoprolol succinate  75 mg Oral BID  . montelukast  10 mg Oral QHS  . oxybutynin  5 mg Oral QHS  . pantoprazole  40 mg Oral BID  . rosuvastatin  10 mg Oral Daily  . traZODone  50 mg Oral QHS  . zinc sulfate  220 mg Oral Daily   Continuous Infusions:   LOS: 3 days   Roxan Hockey, MD [facility]   To contact the attending provider between 7A-7P or the covering provider during after hours 7P-7A, please log into the web site [email or web address] and access using universal [place] password for that web site. If you do not have the password, please call the hospital operator.  12/06/2020, 5:03 PM

## 2020-12-06 NOTE — Progress Notes (Signed)
Pt offered to ambulate to the chair to get out of the bed. She refused ans stated "I dont need to get out of bed". Will attempt again later.

## 2020-12-06 NOTE — Progress Notes (Signed)
At shift change patient called out stating her nose was bleeding. I assess her nose and found not bleeding. There was red noted on her finger nail that she stuck up her left nostril but no active bleeding noted. Within 30 minutes patient called back out stating something was biting her. When I finished with my rounds I went in her room to assess what she was complaining about. She stated something was biting her legs. I pulled the covers back and noted no rash and no scratch marks. She then proceeded to tell that it was not her legs and pointed to her vagina and was telling me it was "right there". I again told her I did not see any rash in her vagina area. She insisted that I touch her vagina to feel it and I refused. I told her that I would inform doctor that she has a vaginal itch.

## 2020-12-07 DIAGNOSIS — G894 Chronic pain syndrome: Secondary | ICD-10-CM | POA: Diagnosis not present

## 2020-12-07 DIAGNOSIS — I5030 Unspecified diastolic (congestive) heart failure: Secondary | ICD-10-CM | POA: Diagnosis not present

## 2020-12-07 DIAGNOSIS — R6889 Other general symptoms and signs: Secondary | ICD-10-CM | POA: Diagnosis not present

## 2020-12-07 DIAGNOSIS — U071 COVID-19: Secondary | ICD-10-CM | POA: Diagnosis not present

## 2020-12-07 DIAGNOSIS — G9341 Metabolic encephalopathy: Secondary | ICD-10-CM | POA: Diagnosis not present

## 2020-12-07 DIAGNOSIS — J441 Chronic obstructive pulmonary disease with (acute) exacerbation: Secondary | ICD-10-CM | POA: Diagnosis not present

## 2020-12-07 DIAGNOSIS — N1832 Chronic kidney disease, stage 3b: Secondary | ICD-10-CM | POA: Diagnosis not present

## 2020-12-07 DIAGNOSIS — E119 Type 2 diabetes mellitus without complications: Secondary | ICD-10-CM | POA: Diagnosis not present

## 2020-12-07 DIAGNOSIS — I69354 Hemiplegia and hemiparesis following cerebral infarction affecting left non-dominant side: Secondary | ICD-10-CM | POA: Diagnosis not present

## 2020-12-07 DIAGNOSIS — R2681 Unsteadiness on feet: Secondary | ICD-10-CM | POA: Diagnosis not present

## 2020-12-07 DIAGNOSIS — I69054 Hemiplegia and hemiparesis following nontraumatic subarachnoid hemorrhage affecting left non-dominant side: Secondary | ICD-10-CM | POA: Diagnosis not present

## 2020-12-07 DIAGNOSIS — E785 Hyperlipidemia, unspecified: Secondary | ICD-10-CM | POA: Diagnosis not present

## 2020-12-07 DIAGNOSIS — I1310 Hypertensive heart and chronic kidney disease without heart failure, with stage 1 through stage 4 chronic kidney disease, or unspecified chronic kidney disease: Secondary | ICD-10-CM | POA: Diagnosis not present

## 2020-12-07 DIAGNOSIS — Z7401 Bed confinement status: Secondary | ICD-10-CM | POA: Diagnosis not present

## 2020-12-07 DIAGNOSIS — Z79899 Other long term (current) drug therapy: Secondary | ICD-10-CM | POA: Diagnosis not present

## 2020-12-07 DIAGNOSIS — K219 Gastro-esophageal reflux disease without esophagitis: Secondary | ICD-10-CM | POA: Diagnosis not present

## 2020-12-07 DIAGNOSIS — I5042 Chronic combined systolic (congestive) and diastolic (congestive) heart failure: Secondary | ICD-10-CM | POA: Diagnosis not present

## 2020-12-07 DIAGNOSIS — I509 Heart failure, unspecified: Secondary | ICD-10-CM | POA: Diagnosis not present

## 2020-12-07 DIAGNOSIS — M6281 Muscle weakness (generalized): Secondary | ICD-10-CM | POA: Diagnosis not present

## 2020-12-07 DIAGNOSIS — R5381 Other malaise: Secondary | ICD-10-CM | POA: Diagnosis not present

## 2020-12-07 DIAGNOSIS — J1282 Pneumonia due to coronavirus disease 2019: Secondary | ICD-10-CM | POA: Diagnosis not present

## 2020-12-07 DIAGNOSIS — R062 Wheezing: Secondary | ICD-10-CM | POA: Diagnosis not present

## 2020-12-07 DIAGNOSIS — R0902 Hypoxemia: Secondary | ICD-10-CM | POA: Diagnosis not present

## 2020-12-07 DIAGNOSIS — E118 Type 2 diabetes mellitus with unspecified complications: Secondary | ICD-10-CM | POA: Diagnosis not present

## 2020-12-07 DIAGNOSIS — I639 Cerebral infarction, unspecified: Secondary | ICD-10-CM | POA: Diagnosis not present

## 2020-12-07 DIAGNOSIS — R269 Unspecified abnormalities of gait and mobility: Secondary | ICD-10-CM | POA: Diagnosis not present

## 2020-12-07 DIAGNOSIS — R2689 Other abnormalities of gait and mobility: Secondary | ICD-10-CM | POA: Diagnosis not present

## 2020-12-07 DIAGNOSIS — G629 Polyneuropathy, unspecified: Secondary | ICD-10-CM | POA: Diagnosis not present

## 2020-12-07 DIAGNOSIS — I10 Essential (primary) hypertension: Secondary | ICD-10-CM | POA: Diagnosis not present

## 2020-12-07 DIAGNOSIS — I480 Paroxysmal atrial fibrillation: Secondary | ICD-10-CM | POA: Diagnosis not present

## 2020-12-07 DIAGNOSIS — J449 Chronic obstructive pulmonary disease, unspecified: Secondary | ICD-10-CM | POA: Diagnosis not present

## 2020-12-07 DIAGNOSIS — I130 Hypertensive heart and chronic kidney disease with heart failure and stage 1 through stage 4 chronic kidney disease, or unspecified chronic kidney disease: Secondary | ICD-10-CM | POA: Diagnosis not present

## 2020-12-07 DIAGNOSIS — I4891 Unspecified atrial fibrillation: Secondary | ICD-10-CM | POA: Diagnosis not present

## 2020-12-07 LAB — COMPREHENSIVE METABOLIC PANEL
ALT: 16 U/L (ref 0–44)
AST: 20 U/L (ref 15–41)
Albumin: 2.2 g/dL — ABNORMAL LOW (ref 3.5–5.0)
Alkaline Phosphatase: 51 U/L (ref 38–126)
Anion gap: 9 (ref 5–15)
BUN: 41 mg/dL — ABNORMAL HIGH (ref 6–20)
CO2: 26 mmol/L (ref 22–32)
Calcium: 9.1 mg/dL (ref 8.9–10.3)
Chloride: 104 mmol/L (ref 98–111)
Creatinine, Ser: 1.55 mg/dL — ABNORMAL HIGH (ref 0.44–1.00)
GFR, Estimated: 39 mL/min — ABNORMAL LOW (ref >60)
Glucose, Bld: 227 mg/dL — ABNORMAL HIGH (ref 70–99)
Potassium: 4 mmol/L (ref 3.5–5.1)
Sodium: 139 mmol/L (ref 135–145)
Total Bilirubin: 0.6 mg/dL (ref 0.3–1.2)
Total Protein: 6.5 g/dL (ref 6.5–8.1)

## 2020-12-07 LAB — CBC WITH DIFFERENTIAL/PLATELET
Abs Immature Granulocytes: 0.09 10*3/uL — ABNORMAL HIGH (ref 0.00–0.07)
Basophils Absolute: 0 10*3/uL (ref 0.0–0.1)
Basophils Relative: 0 %
Eosinophils Absolute: 0 10*3/uL (ref 0.0–0.5)
Eosinophils Relative: 0 %
HCT: 39.6 % (ref 36.0–46.0)
Hemoglobin: 12.2 g/dL (ref 12.0–15.0)
Immature Granulocytes: 1 %
Lymphocytes Relative: 9 %
Lymphs Abs: 0.6 10*3/uL — ABNORMAL LOW (ref 0.7–4.0)
MCH: 28.4 pg (ref 26.0–34.0)
MCHC: 30.8 g/dL (ref 30.0–36.0)
MCV: 92.1 fL (ref 80.0–100.0)
Monocytes Absolute: 0.3 10*3/uL (ref 0.1–1.0)
Monocytes Relative: 4 %
Neutro Abs: 5.8 10*3/uL (ref 1.7–7.7)
Neutrophils Relative %: 86 %
Platelets: 404 10*3/uL — ABNORMAL HIGH (ref 150–400)
RBC: 4.3 MIL/uL (ref 3.87–5.11)
RDW: 13.3 % (ref 11.5–15.5)
WBC: 6.8 10*3/uL (ref 4.0–10.5)
nRBC: 0.3 % — ABNORMAL HIGH (ref 0.0–0.2)

## 2020-12-07 LAB — FERRITIN: Ferritin: 471 ng/mL — ABNORMAL HIGH (ref 11–307)

## 2020-12-07 LAB — C-REACTIVE PROTEIN: CRP: 3.6 mg/dL — ABNORMAL HIGH (ref <1.0)

## 2020-12-07 LAB — MAGNESIUM: Magnesium: 1.9 mg/dL (ref 1.7–2.4)

## 2020-12-07 LAB — D-DIMER, QUANTITATIVE: D-Dimer, Quant: 0.68 ug/mL-FEU — ABNORMAL HIGH (ref 0.00–0.50)

## 2020-12-07 LAB — PHOSPHORUS: Phosphorus: 3.2 mg/dL (ref 2.5–4.6)

## 2020-12-07 LAB — GLUCOSE, CAPILLARY
Glucose-Capillary: 225 mg/dL — ABNORMAL HIGH (ref 70–99)
Glucose-Capillary: 149 mg/dL — ABNORMAL HIGH (ref 70–99)

## 2020-12-07 MED ORDER — INSULIN GLARGINE 100 UNIT/ML ~~LOC~~ SOLN: 14.0000 [IU] | Freq: Every day | SUBCUTANEOUS | 11 refills | Status: DC

## 2020-12-07 MED ORDER — ROSUVASTATIN CALCIUM 10 MG PO TABS: 10.0000 mg | ORAL_TABLET | Freq: Every day | ORAL | 0 refills | Status: DC

## 2020-12-07 MED ORDER — APIXABAN 5 MG PO TABS
5.0000 mg | ORAL_TABLET | Freq: Two times a day (BID) | ORAL | 3 refills | Status: DC
Start: 2020-12-07 — End: 2021-03-27

## 2020-12-07 MED ORDER — ZINC SULFATE 220 (50 ZN) MG PO CAPS: 220.0000 mg | ORAL_CAPSULE | Freq: Every day | ORAL | 2 refills | Status: DC

## 2020-12-07 MED ORDER — AMLODIPINE BESYLATE 10 MG PO TABS: 10.0000 mg | ORAL_TABLET | Freq: Every day | ORAL | 5 refills | Status: DC

## 2020-12-07 MED ORDER — INSULIN ASPART 100 UNIT/ML FLEXPEN
0.0000 [IU] | PEN_INJECTOR | Freq: Three times a day (TID) | SUBCUTANEOUS | 11 refills | Status: DC
Start: 2020-12-07 — End: 2021-01-01

## 2020-12-07 MED ORDER — DULOXETINE HCL 60 MG PO CPEP: 60.0000 mg | ORAL_CAPSULE | Freq: Every day | ORAL | 3 refills | Status: DC

## 2020-12-07 MED ORDER — METOPROLOL SUCCINATE ER 100 MG PO TB24: 100.0000 mg | ORAL_TABLET | Freq: Two times a day (BID) | ORAL | 3 refills | Status: DC

## 2020-12-07 MED ORDER — TRAZODONE HCL 50 MG PO TABS: 50.0000 mg | ORAL_TABLET | Freq: Every day | ORAL | 3 refills | Status: DC

## 2020-12-07 MED ORDER — PREDNISONE 50 MG PO TABS: 50.0000 mg | ORAL_TABLET | Freq: Every day | ORAL | 0 refills | Status: DC

## 2020-12-07 NOTE — TOC Transition Note (Signed)
Transition of Care [facility]) - CM/SW Discharge Note   Patient Details  Name: [name] MRN: [id number] Date of Birth: 25-Aug-1966  Transition of Care [facility]) CM/SW Contact:  Natasha Bence, LCSW Phone Number: 12/07/2020, 11:46 AM   Clinical Narrative:    CSW notified of patient's readiness for discharge. CSW verified with [name] at [facility] that patient is able to return on  12/07/20. CSW completed med necessity and called for [facility]. Nurse to call report. TOC signing off.    Final next level of care: Skilled Nursing Facility Barriers to Discharge: Barriers Resolved   Patient Goals and CMS Choice Patient states their goals for this hospitalization and ongoing recovery are:: Return to SNF CMS Medicare.gov Compare Post Acute Care list provided to:: Patient Choice offered to / list presented to : Patient  Discharge Placement                Patient to be transferred to facility by: [facility] Name of family member notified: [name] Patient and family notified of of transfer: 12/07/20  Discharge Plan and Services     Post Acute Care Choice: [facility]          DME Arranged: N/A DME Agency: NA       HH Arranged: NA HH Agency: NA        Social Determinants of Health (SDOH) Interventions     Readmission Risk Interventions Readmission Risk Prevention Plan 10/31/2020 08/27/2020  Transportation Screening Complete Complete  Medication Review [employer]) Complete Complete  PCP or Specialist appointment within 3-5 days of discharge - Complete  HRI or [place] - Complete  SW Recovery Care/Counseling Consult Complete Complete  Palliative Care Screening Not Applicable Not Applicable  Skilled Nursing Facility Complete Complete  Some recent data might be hidden

## 2020-12-07 NOTE — Progress Notes (Signed)
Nsg Discharge Note  Admit Date:  12/03/2020 Discharge date: 12/07/2020   [name] to be D/C'd Skilled nursing facility per MD order.  AVS completed.  Copy for chart, and copy for patient signed, and dated. Patient/caregiver able to verbalize understanding. Report called to receiving facility. Patient transported by EMS. Patient stable upon discharge.   Discharge Medication: Allergies as of 12/07/2020   No Known Allergies     Medication List    STOP taking these medications   amoxicillin-clavulanate 875-125 MG tablet Commonly known as: AUGMENTIN   azithromycin 500 MG tablet Commonly known as: ZITHROMAX   cloNIDine 0.2 MG tablet Commonly known as: Catapres   dexamethasone 6 MG tablet Commonly known as: DECADRON   saccharomyces boulardii 250 MG capsule Commonly known as: FLORASTOR   zinc gluconate 50 MG tablet     TAKE these medications   acetaminophen 325 MG tablet Commonly known as: TYLENOL Take 2 tablets (650 mg total) by mouth every 6 (six) hours as needed for headache, fever or mild pain.   albuterol 108 (90 Base) MCG/ACT inhaler Commonly known as: ProAir HFA INHALE 2 PUFFS EVERY 6 HOURS AS NEEDED FOR SHORTNESS OF BREATH/WHEEZING. What changed:   how much to take  how to take this  when to take this  reasons to take this  additional instructions   amLODipine 10 MG tablet Commonly known as: NORVASC Take 1 tablet (10 mg total) by mouth daily.   apixaban 5 MG Tabs tablet Commonly known as: Eliquis Take 1 tablet (5 mg total) by mouth 2 (two) times daily. Okay to restart Eliquis on 11/17/2020   DULoxetine 60 MG capsule Commonly known as: CYMBALTA Take 1 capsule (60 mg total) by mouth daily.   furosemide 20 MG tablet Commonly known as: Lasix Take 1 tablet (20 mg total) by mouth daily. For fluid   gabapentin 300 MG capsule Commonly known as: NEURONTIN TAKE 1 CAPSULE BY MOUTH THREE TIMES A DAY. What changed: See the new instructions.    guaiFENesin 600 MG 12 hr tablet Commonly known as: MUCINEX Take 600 mg by mouth 2 (two) times daily.   insulin aspart 100 UNIT/ML FlexPen Commonly known as: NOVOLOG Inject 0-10 Units into the skin 3 (three) times daily with meals. insulin aspart (novoLOG) injection 0-10 Units 0-10 Units Subcutaneous, 3 times daily with meals CBG < 70: Implement Hypoglycemia Standing Orders and refer to Hypoglycemia Standing Orders sidebar report  CBG 70 - 120: 0 unit CBG 121 - 150: 0 unit  CBG 151 - 200: 1 unit CBG 201 - 250: 2 units CBG 251 - 300: 4 units CBG 301 - 350: 6 units  CBG 351 - 400: 8 units  CBG > 400: 10 units   insulin glargine 100 UNIT/ML injection Commonly known as: LANTUS Inject 0.14 mLs (14 Units total) into the skin at bedtime. What changed:   how much to take  when to take this   levETIRAcetam 750 MG tablet Commonly known as: KEPPRA Take 1 tablet (750 mg total) by mouth 2 (two) times daily.   metoprolol succinate 100 MG 24 hr tablet Commonly known as: TOPROL-XL Take 1 tablet (100 mg total) by mouth 2 (two) times daily. Take with or immediately following a meal. What changed:   medication strength  how much to take  additional instructions   montelukast 10 MG tablet Commonly known as: SINGULAIR TAKE (1) TABLET BY MOUTH AT BEDTIME. What changed: See the new instructions.   oxybutynin 5 MG 24 hr  tablet Commonly known as: DITROPAN-XL Take 5 mg by mouth at bedtime.   pantoprazole 40 MG tablet Commonly known as: PROTONIX Take 1 tablet (40 mg total) by mouth 2 (two) times daily. For stomach ulcers   predniSONE 50 MG tablet Commonly known as: DELTASONE Take 1 tablet (50 mg total) by mouth daily with breakfast. Start taking on: December 08, 2020   rosuvastatin 10 MG tablet Commonly known as: CRESTOR Take 1 tablet (10 mg total) by mouth daily.   sitaGLIPtin 100 MG tablet Commonly known as: JANUVIA Take 100 mg by mouth daily.   Spiriva HandiHaler 18 MCG inhalation  capsule Generic drug: tiotropium Place 18 mcg into inhaler and inhale daily as needed (Shortness of breath).   Tradjenta 5 MG Tabs tablet Generic drug: linagliptin Take 5 mg by mouth daily.   traZODone 50 MG tablet Commonly known as: DESYREL Take 1 tablet (50 mg total) by mouth at bedtime. What changed: See the new instructions.   vitamin C 500 MG tablet Commonly known as: ASCORBIC ACID Take 500 mg by mouth daily.   VITAMIN D (CHOLECALCIFEROL) PO Take 5,000 Units by mouth daily.   zinc sulfate 220 (50 Zn) MG capsule Take 1 capsule (220 mg total) by mouth daily. Start taking on: December 08, 2020            Discharge Care Instructions  (From admission, onward)         Start     Ordered   12/07/20 0000  Discharge wound care:       Comments: As advised   12/07/20 1121          Discharge Assessment: Vitals:   12/07/20 0830 12/07/20 1211  BP:  (!) 118/97  Pulse:  77  Resp:  20  Temp:  98.6 F (37 C)  SpO2: 100% 94%   Skin clean, dry and intact without evidence of skin break down, no evidence of skin tears noted. IV catheter discontinued intact. Site without signs and symptoms of complications - no redness or edema noted at insertion site, patient denies c/o pain - only slight tenderness at site.  Dressing with slight pressure applied.  D/c Instructions-Education: Discharge instructions given to patient/family with verbalized understanding. D/c education completed with patient/family including follow up instructions, medication list, d/c activities limitations if indicated, with other d/c instructions as indicated by MD - patient able to verbalize understanding, all questions fully answered. Patient instructed to return to ED, call 911, or call MD for any changes in condition.  Patient escorted via [place], and D/C home via private auto.  Zenaida Deed, RN 12/07/2020 12:33 PM

## 2020-12-07 NOTE — Progress Notes (Signed)
Report called to [facility] took report. Shanon Brow had no further questions. Awaiting for transport for patient.

## 2020-12-07 NOTE — Discharge Instructions (Signed)
1)Avoid ibuprofen/Advil/Aleve/Motrin/Goody Powders/Naproxen/BC powders/Meloxicam/Diclofenac/Indomethacin and other Nonsteroidal anti-inflammatory medications as these will make you more likely to bleed and can cause stomach ulcers, can also cause Kidney problems.   2)Please recheck CBC and BMP test on Tuesday, 12/10/2020

## 2020-12-07 NOTE — Discharge Summary (Signed)
[name], is a 55 y.o. female  DOB [birth year]  MRN [id number].  Admission date:  12/03/2020  Admitting Physician  Bethena Roys, MD  Discharge Date:  12/07/2020   Primary MD  Caprice Renshaw, MD  Recommendations for primary care physician for things to follow:   1)Avoid ibuprofen/Advil/Aleve/Motrin/Goody Powders/Naproxen/BC powders/Meloxicam/Diclofenac/Indomethacin and other Nonsteroidal anti-inflammatory medications as these will make you more likely to bleed and can cause stomach ulcers, can also cause Kidney problems.   2)Please recheck CBC and BMP test on Tuesday, 12/10/2020  Admission Diagnosis  Pneumonia due to COVID-19 virus [U07.1, J12.82]   Discharge Diagnosis  Pneumonia due to COVID-19 virus [U07.1, J12.82]    Principal Problem:   Pneumonia due to COVID-19 virus Active Problems:   Essential hypertension   CKD (chronic kidney disease) stage 3, GFR 30-59 ml/min (HCC)   Chronic combined systolic and diastolic CHF (congestive heart failure) (HCC)   Type 2 diabetes mellitus with vascular disease (HCC)   PAF (paroxysmal atrial fibrillation) (HCC)   Seizures ([place])   Acute respiratory failure with hypoxia (HCC)      Past Medical History:  Diagnosis Date  . Alcohol use   . Ankle fracture, lateral malleolus, closed 2013  . Anxiety   . Breast mass, left 2013  . Chronic anemia   . CKD (chronic kidney disease), stage III ([place])   . Cocaine abuse ([place])   . COPD (chronic obstructive pulmonary disease) ([place])   . Diabetes mellitus, type 2 ([place])   . Diabetic Charcot foot ([place])   . Essential hypertension   . History of cardiomyopathy   . History of GI bleed 2011  . Hyperlipidemia   . Noncompliance   . Obesity   . Panic attacks   . PAT (paroxysmal atrial tachycardia) (HCC)    Previously on Amiodarone  . Seizures ([place]) 10/01/2020  . Sleep apnea    Not on CPAP  . Stroke ([place]) 2018  .  Tobacco abuse   . Urinary incontinence     Past Surgical History:  Procedure Laterality Date  . BIOPSY  11/12/2020   Procedure: BIOPSY;  Surgeon: Harvel Quale, MD;  [facility];  Service: Gastroenterology;;  . BREAST BIOPSY    . CARDIAC CATHETERIZATION N/A 07/28/2016   Procedure: Left Heart Cath and Coronary Angiography;  Surgeon: Jettie Booze, MD;  Location: [facility];  Service: Cardiovascular;  Laterality: N/A;  . COLONOSCOPY N/A 05/10/2019   Procedure: COLONOSCOPY;  Surgeon: Danie Binder, MD;  [facility];  Service: Endoscopy;  Laterality: N/A;  Phenergan 12.5 mg IV in pre-op  . DILATION AND CURETTAGE OF UTERUS    . ESOPHAGOGASTRODUODENOSCOPY (EGD) WITH PROPOFOL N/A 11/12/2020   Procedure: ESOPHAGOGASTRODUODENOSCOPY (EGD) WITH PROPOFOL;  Surgeon: Harvel Quale, MD;  [facility];  Service: Gastroenterology;  Laterality: N/A;  . I & D EXTREMITY Bilateral 09/22/2017   Procedure: BILATERAL DEBRIDEMENT LEG/FOOT ULCERS, APPLY VERAFLO WOUND VAC;  Surgeon: Newt Minion, MD;  Location: [facility];  Service: Orthopedics;  Laterality: Bilateral;  . I & D EXTREMITY Right 10/11/2018   Procedure: IRRIGATION AND DEBRIDEMENT RIGHT HAND;  Surgeon: [name], MD;  Location: [facility];  Service: Orthopedics;  Laterality: Right;  . I & D EXTREMITY Right 10/13/2018   Procedure: REPEAT IRRIGATION AND DEBRIDEMENT RIGHT HAND;  Surgeon: [name], MD;  Location: [facility];  Service: Orthopedics;  Laterality: Right;  . I & D EXTREMITY Right 11/22/2018   Procedure: IRRIGATION AND DEBRIDEMENT AND PINNING RIGHT HAND;  Surgeon: [name], MD;  Location: [facility];  Service: Orthopedics;  Laterality: Right;  . IR RADIOLOGIST EVAL & MGMT  07/05/2018  . POLYPECTOMY  05/10/2019   Procedure: POLYPECTOMY;  Surgeon: Danie Binder, MD;  [facility];  Service: Endoscopy;;  . SKIN SPLIT GRAFT Bilateral 09/28/2017   Procedure: BILATERAL  SPLIT THICKNESS SKIN GRAFT LEGS/FEET AND APPLY VAC;  Surgeon: Newt Minion, MD;  Location: [facility];  Service: Orthopedics;  Laterality: Bilateral;  . SKIN SPLIT GRAFT Right 11/22/2018   Procedure: SKIN GRAFT SPLIT THICKNESS;  Surgeon: [name], MD;  Location: [facility];  Service: Orthopedics;  Laterality: Right;      HPI  from the history and physical done on the day of admission:   Chief Complaint: Low O2 sats, Covid positive  HPI: [name] is a 55 y.o. female with medical history significant for diastolic and systolic CHF, diabetes mellitus, COPD, atrial fibrillation on chronic anticoagulation, hypertension, stroke, seizures. Patient was brought to the ED from nursing home reports of low oxygen level.  Patient tested positive for COVID, and had been on the COVID floor for about 1 - 2 weeks.  She was treated with azithomycin, steroids, augmentin and breathing treatments. Patient does not remember what date she tested positive. She is unvaccinated.  O2 sats were noted to be 71% on room air she was placed on 6 L. She was noted to have decreased oral intake today. She denies vomiting or loose stools.  To me, patient endorses cough, but she denies difficulty breathing.  I called nursing home to confirm date of positive test, no response.   Hospitalization 1/4 - 1/20  atrial fibrillation with RVR, GI bleed, DKA, and C. difficile positive diarrhea.   She was subequently discharged to SNF.   ED Course: ED O2 sats were 85% on 2 L, patient is currently on 6 L.  Will treat 17-22.  Heart rate 90s.  Blood pressure systolic 09-381.  Lactic acid 1.2.  D-dimer 1.46.  Procalcitonin less than 0.1.  Elevation in inflammatory markers and CRP 33.5.  Chest x-ray shows bilateral interstitial and patchy alveolar airspace opacities concerning for multilobar pneumonia, including atypical or viral. Hospitalist to admit.      Hospital Course:   Brief admission Narrative:  As per H&P written by Dr.  Denton Brick on 12/03/2020 Alexander Bergeron a 55 y.o.femalewith medical history significant fordiastolic and systolic CHF, diabetes mellitus, COPD, atrial fibrillation on chronic anticoagulation, hypertension, stroke, seizures. Patient was brought to the ED from nursing home reports of low oxygen level. Patient tested positive for COVID, and had been on the COVID floor for about 1 - 2 weeks. She was treated with azithomycin, steroids, augmentin and breathing treatments. Patient does not remember what date she tested positive. She is unvaccinated.O2 sats were noted to be 71% on room air she was placed on 6 L. She was noted to have decreased oral intake today. She denies vomiting or loose stools.To me, patient  endorses cough, but she denies difficulty breathing at rest.  Hospitalization 1/4-1/20atrial fibrillation with RVR, GI bleed,DKA, and C. difficile positive diarrhea.She was subequently discharged toSNF.  ED Course:ED O2 sats were 85% on 2 L, patient is currently on 6 L. Will treat 17-22. Heart rate 90s. Blood pressure systolic 16-109. Lactic acid 1.2. D-dimer 1.46. Procalcitonin less than 0.1. Elevation in inflammatory markers and CRP 33.5. Chest x-ray shows bilateral interstitial and patchy alveolar airspace opacities concerning for multilobar pneumonia,including atypical or viral.  Assessment & Plan: 1-acute respiratory failure with hypoxia secondary to pneumonia due to COVID-19 virus -Patient treated with Actemra on 12/03/20 -Treated with IV steroids okay to discharge on p.o. prednisone -Hypoxia resolved -Out of window for remdesivir infusions. -Continue vitamin C, zinc, incentive respirometer, flutter valve,   and bronchodilators. -Continue also antitussive and mucolytic's medication.  2-paroxysmal atrial fibrillation -Burst of A. fib with RVR persist with increased metoprolol to 100 mg twice daily for better rate control -Continue Eliquis 5 mg twice daily for  stroke prophylaxis   3-Essential hypertension =-Metoprolol as above -Amlodipine 10 mg daily  4-depression/anxiety -Continue Cymbalta  5-CKD (chronic kidney disease) stage 3, GFR 30-59 ml/min (HCC) -Stage IIIb at baseline -Minimize nephrotoxic agents and avoid hypotension and contrast.  6-Chronic combined systolic and diastolic CHF (congestive heart failure) (HCC) -Appears to be compensated -Continue to follow low-sodium diet,   -Continue beta-blocker -No ACE inhibitors or ARB in the setting of renal failure. -Lasix as ordered  7-Type 2 diabetes mellitus with vascular disease and hyperglycemia (HCC) -Insulin regimen adjusted, anticipate glycemic control will improve with steroid taper  8-Seizures (HCC) -No seizure activity appreciated currently -Continue Keppra  9-hyperlipidemia -Continue statins.  10-GERD/GI prophylaxis -Continue PPI.   DVT prophylaxis: chronically on eliquis. Code Status: Full code Family Communication: no family at bedside; unable to reach son or daughter over the phone. Disposition:   Status is: Inpatient  Dispo: The patient is from: [facility])  Anticipated d/c is to: SNF on 12/07/20---   Anticipated d/c date is: 12/07/20  Patient currently medically stable for discharge   Consultants:   None   Procedures:  See below for x-ray reports.   Antimicrobials:    Discharge Condition: Stable without hypoxia  Follow UP   Contact information for after-discharge care    [facility] .   Service: Skilled Nursing Contact information: [address] [place] [phone]                   Consults obtained - na  Diet and Activity recommendation:  As advised  Discharge Instructions     Discharge Instructions    Call MD for:  difficulty breathing, headache or visual disturbances    Complete by: As directed    Call MD for:  persistant dizziness or light-headedness   Complete by: As directed    Call MD for:  persistant nausea and vomiting   Complete by: As directed    Call MD for:  temperature >100.4   Complete by: As directed    Diet - low sodium heart healthy   Complete by: As directed    Diet Carb Modified   Complete by: As directed    Discharge instructions   Complete by: As directed    1)Avoid ibuprofen/Advil/Aleve/Motrin/Goody Powders/Naproxen/BC powders/Meloxicam/Diclofenac/Indomethacin and other Nonsteroidal anti-inflammatory medications as these will make you more likely to bleed and can cause stomach ulcers, can also cause Kidney problems.  2) please recheck CBC and BMP test on Tuesday, 12/10/2020   Discharge wound care:   Complete by: As directed    As advised   Increase activity slowly   Complete by: As directed         Discharge Medications     Allergies as of 12/07/2020   No Known Allergies     Medication List    STOP taking these medications   amoxicillin-clavulanate 875-125 MG tablet Commonly known as: AUGMENTIN   azithromycin 500 MG tablet Commonly known as: ZITHROMAX   cloNIDine 0.2 MG tablet Commonly known as: Catapres   dexamethasone 6 MG tablet Commonly known as: DECADRON   saccharomyces boulardii 250 MG capsule Commonly known as: FLORASTOR   zinc gluconate 50 MG tablet     TAKE these medications   acetaminophen 325 MG tablet Commonly known as: TYLENOL Take 2 tablets (650 mg total) by mouth every 6 (six) hours as needed for headache, fever or mild pain.   albuterol 108 (90 Base) MCG/ACT inhaler Commonly known as: ProAir HFA INHALE 2 PUFFS EVERY 6 HOURS AS NEEDED FOR SHORTNESS OF BREATH/WHEEZING. What changed:   how much to take  how to take this  when to take this  reasons to take this  additional instructions   amLODipine 10 MG tablet Commonly known as: NORVASC Take 1 tablet (10 mg total) by mouth  daily.   apixaban 5 MG Tabs tablet Commonly known as: Eliquis Take 1 tablet (5 mg total) by mouth 2 (two) times daily. Okay to restart Eliquis on 11/17/2020   DULoxetine 60 MG capsule Commonly known as: CYMBALTA Take 1 capsule (60 mg total) by mouth daily.   furosemide 20 MG tablet Commonly known as: Lasix Take 1 tablet (20 mg total) by mouth daily. For fluid   gabapentin 300 MG capsule Commonly known as: NEURONTIN TAKE 1 CAPSULE BY MOUTH THREE TIMES A DAY. What changed: See the new instructions.   guaiFENesin 600 MG 12 hr tablet Commonly known as: MUCINEX Take 600 mg by mouth 2 (two) times daily.   insulin aspart 100 UNIT/ML FlexPen Commonly known as: NOVOLOG Inject 0-10 Units into the skin 3 (three) times daily with meals. insulin aspart (novoLOG) injection 0-10 Units 0-10 Units Subcutaneous, 3 times daily with meals CBG < 70: Implement Hypoglycemia Standing Orders and refer to Hypoglycemia Standing Orders sidebar report  CBG 70 - 120: 0 unit CBG 121 - 150: 0 unit  CBG 151 - 200: 1 unit CBG 201 - 250: 2 units CBG 251 - 300: 4 units CBG 301 - 350: 6 units  CBG 351 - 400: 8 units  CBG > 400: 10 units   insulin glargine 100 UNIT/ML injection Commonly known as: LANTUS Inject 0.14 mLs (14 Units total) into the skin at bedtime. What changed:   how much to take  when to take this   levETIRAcetam 750 MG tablet Commonly known as: KEPPRA Take 1 tablet (750 mg total) by mouth 2 (two) times daily.   metoprolol succinate 100 MG 24 hr tablet Commonly known as: TOPROL-XL Take 1 tablet (100 mg total) by mouth 2 (two) times daily. Take with or immediately following a meal. What changed:   medication strength  how much to take  additional instructions   montelukast 10 MG tablet Commonly known as: SINGULAIR TAKE (1) TABLET BY MOUTH AT BEDTIME. What changed: See the new instructions.   oxybutynin 5 MG 24 hr tablet Commonly known as: DITROPAN-XL Take 5 mg by mouth  at bedtime.    pantoprazole 40 MG tablet Commonly known as: PROTONIX Take 1 tablet (40 mg total) by mouth 2 (two) times daily. For stomach ulcers   predniSONE 50 MG tablet Commonly known as: DELTASONE Take 1 tablet (50 mg total) by mouth daily with breakfast. Start taking on: December 08, 2020   rosuvastatin 10 MG tablet Commonly known as: CRESTOR Take 1 tablet (10 mg total) by mouth daily.   sitaGLIPtin 100 MG tablet Commonly known as: JANUVIA Take 100 mg by mouth daily.   Spiriva HandiHaler 18 MCG inhalation capsule Generic drug: tiotropium Place 18 mcg into inhaler and inhale daily as needed (Shortness of breath).   Tradjenta 5 MG Tabs tablet Generic drug: linagliptin Take 5 mg by mouth daily.   traZODone 50 MG tablet Commonly known as: DESYREL Take 1 tablet (50 mg total) by mouth at bedtime. What changed: See the new instructions.   vitamin C 500 MG tablet Commonly known as: ASCORBIC ACID Take 500 mg by mouth daily.   VITAMIN D (CHOLECALCIFEROL) PO Take 5,000 Units by mouth daily.   zinc sulfate 220 (50 Zn) MG capsule Take 1 capsule (220 mg total) by mouth daily. Start taking on: December 08, 2020            Discharge Care Instructions  (From admission, onward)         Start     Ordered   12/07/20 0000  Discharge wound care:       Comments: As advised   12/07/20 1121          Major procedures and Radiology Reports - PLEASE review detailed and final reports for all details, in brief -    DG Chest Port 1 View  Result Date: 12/03/2020 CLINICAL DATA:  Shortness of breath EXAM: PORTABLE CHEST 1 VIEW COMPARISON:  11/02/2020 FINDINGS: Bilateral interstitial and patchy alveolar airspace opacities. No pleural effusion or pneumothorax. Stable cardiomegaly. No acute osseous abnormality. IMPRESSION: Bilateral interstitial and patchy alveolar airspace opacities concerning for multilobar pneumonia including atypical viral pneumonia. Electronically Signed   By: Kathreen Devoid    On: 12/03/2020 14:37    Micro Results    Recent Results (from the past 240 hour(s))  Blood Culture (routine x 2)     Status: None (Preliminary result)   Collection Time: 12/03/20  2:32 PM   Specimen: BLOOD  Result Value Ref Range Status   Specimen Description BLOOD RIGHT HAND  Final   Special Requests   Final    BOTTLES DRAWN AEROBIC AND ANAEROBIC Blood Culture adequate volume   Culture   Final    NO GROWTH 4 DAYS Performed at [facility], [address] [place], [place] [address]    Report Status PENDING  Incomplete  Blood Culture (routine x 2)     Status: None (Preliminary result)   Collection Time: 12/03/20  2:32 PM   Specimen: BLOOD  Result Value Ref Range Status   Specimen Description BLOOD RIGHT ANTECUBITAL  Final   Special Requests   Final    BOTTLES DRAWN AEROBIC AND ANAEROBIC Blood Culture adequate volume   Culture   Final    NO GROWTH 4 DAYS Performed at [facility], [address], [place], [place] [address]    Report Status PENDING  Incomplete    Today   Subjective    Erlene Devita today has no new complaints,  eating and drinking well, No fever  Or chills   No Nausea, Vomiting or Diarrhea  Patient  has been seen and examined prior to discharge   Objective   Blood pressure (!) 150/94, pulse 72, temperature (!) 97.1 F (36.2 C), resp. rate 19, height 5\' 1"  (1.549 m), weight 78.9 kg, last menstrual period 05/19/2016, SpO2 100 %.  No intake or output data in the 24 hours ending 12/07/20 1122  Exam Gen:- Awake Alert, no acute distress, no conversational dyspnea HEENT:- [place].AT, No sclera icterus Neck-Supple Neck,No JVD,.  Lungs-improved air movement, no wheezing  CV- S1, S2 normal, irregular Abd-  +ve B.Sounds, Abd Soft, No tenderness,    Extremity/Skin:- No  edema,   good pulses Psych-affect is appropriate, oriented x3 Neuro-no new focal deficits, no tremors    Data Review   CBC w Diff:  Lab Results  Component Value Date   WBC 6.8 12/07/2020    HGB 12.2 12/07/2020   HCT 39.6 12/07/2020   PLT 404 (H) 12/07/2020   LYMPHOPCT 9 12/07/2020   MONOPCT 4 12/07/2020   EOSPCT 0 12/07/2020   BASOPCT 0 12/07/2020    CMP:  Lab Results  Component Value Date   NA 139 12/07/2020   K 4.0 12/07/2020   CL 104 12/07/2020   CO2 26 12/07/2020   BUN 41 (H) 12/07/2020   CREATININE 1.55 (H) 12/07/2020   CREATININE 1.56 (H) 07/26/2018   PROT 6.5 12/07/2020   ALBUMIN 2.2 (L) 12/07/2020   BILITOT 0.6 12/07/2020   ALKPHOS 51 12/07/2020   AST 20 12/07/2020   ALT 16 12/07/2020  .   Total Discharge time is about 33 minutes  Roxan Hockey M.D on 12/07/2020 at 11:22 AM  Go to [email or web address] -  for contact info  [facility] - Office  [phone]

## 2020-12-09 ENCOUNTER — Telehealth: Payer: Self-pay

## 2020-12-09 DIAGNOSIS — I10 Essential (primary) hypertension: Secondary | ICD-10-CM | POA: Diagnosis not present

## 2020-12-09 DIAGNOSIS — K219 Gastro-esophageal reflux disease without esophagitis: Secondary | ICD-10-CM | POA: Diagnosis not present

## 2020-12-09 DIAGNOSIS — E785 Hyperlipidemia, unspecified: Secondary | ICD-10-CM | POA: Diagnosis not present

## 2020-12-09 DIAGNOSIS — J449 Chronic obstructive pulmonary disease, unspecified: Secondary | ICD-10-CM | POA: Diagnosis not present

## 2020-12-09 DIAGNOSIS — E119 Type 2 diabetes mellitus without complications: Secondary | ICD-10-CM | POA: Diagnosis not present

## 2020-12-09 DIAGNOSIS — I130 Hypertensive heart and chronic kidney disease with heart failure and stage 1 through stage 4 chronic kidney disease, or unspecified chronic kidney disease: Secondary | ICD-10-CM | POA: Diagnosis not present

## 2020-12-09 DIAGNOSIS — I4891 Unspecified atrial fibrillation: Secondary | ICD-10-CM | POA: Diagnosis not present

## 2020-12-09 DIAGNOSIS — I5030 Unspecified diastolic (congestive) heart failure: Secondary | ICD-10-CM | POA: Diagnosis not present

## 2020-12-09 LAB — CULTURE, BLOOD (ROUTINE X 2)
Culture: NO GROWTH
Culture: NO GROWTH
Special Requests: ADEQUATE
Special Requests: ADEQUATE

## 2020-12-09 NOTE — Telephone Encounter (Signed)
NO ACTIVE NOTE NEEDED, PCP NO LONGER HERE.

## 2020-12-10 DIAGNOSIS — G9341 Metabolic encephalopathy: Secondary | ICD-10-CM | POA: Diagnosis not present

## 2020-12-10 DIAGNOSIS — R5381 Other malaise: Secondary | ICD-10-CM | POA: Diagnosis not present

## 2020-12-19 ENCOUNTER — Encounter: Payer: Self-pay | Admitting: *Deleted

## 2020-12-19 DIAGNOSIS — E119 Type 2 diabetes mellitus without complications: Secondary | ICD-10-CM | POA: Diagnosis not present

## 2020-12-19 DIAGNOSIS — K219 Gastro-esophageal reflux disease without esophagitis: Secondary | ICD-10-CM | POA: Diagnosis not present

## 2020-12-19 DIAGNOSIS — Z79899 Other long term (current) drug therapy: Secondary | ICD-10-CM | POA: Diagnosis not present

## 2020-12-19 DIAGNOSIS — E785 Hyperlipidemia, unspecified: Secondary | ICD-10-CM | POA: Diagnosis not present

## 2020-12-19 DIAGNOSIS — I10 Essential (primary) hypertension: Secondary | ICD-10-CM | POA: Diagnosis not present

## 2020-12-19 NOTE — Patient Outreach (Signed)
[facility]) Care Management  12/19/2020  BRODI NERY 08-Oct-1966 [id number]  Case conference and case closure.  Pt is now in LTC at [facility] which appears to be a permanent placement as pt has been unable to provide herself basic needs.  Goals Addressed            This Visit's Progress   . COMPLETED: Find Help in My Community       Timeframe:  Short term Priority:  High Start Date:     10/14/20                        Expected End Date:   11/25/20                    Follow Up Date 11/01/20  - follow-up on any referrals for help I am given    Why is this important?    Knowing how and where to find help for yourself or family in your neighborhood and community is an important skill.   You will want to take some steps to learn how.    Notes: Needs LCSW referral to apply for CAP program and provide food resources. Also needs HCPOA completed. 12/19/20 Pt has readmitted on several occasions and has been admitted to [facility] at [facility] in [place].    . COMPLETED: Lifestyle Change-Hypertension       Timeframe:  Long-Range Goal Priority:  Medium Start Date: 10/14/20                             Expected End Date:   01/23/21                    Follow Up Date: 1/31  - agree to work together to make changes - have a family meeting to talk about healthy habits - learn about high blood pressure    Why is this important?    The changes that you are asked to make may be hard to do.   This is especially true when the changes are life-long.   Knowing why it is important to you is the first step.   Working on the change with your family or support person helps you not feel alone.   Reward yourself and family or support person when goals are met. This can be an activity you choose like bowling, hiking, biking, swimming or shooting hoops.     Notes: Unable to work with pt due to hospitalizations and subsequent placement. Case closed.    . COMPLETED: Track and Manage  My Blood Pressure-Hypertension       Timeframe:  Long-Range Goal Priority:  Medium Start Date:   10/14/20                          Expected End Date: 01/23/21                       Follow Up Date 11/25/20   - check blood pressure daily - write blood pressure results in a log or diary    Why is this important?    You won't feel high blood pressure, but it can still hurt your blood vessels.   High blood pressure can cause heart or kidney problems. It can also cause a stroke.   Making lifestyle changes like losing a little  weight or eating less salt will help.   Checking your blood pressure at home and at different times of the day can help to control blood pressure.   If the doctor prescribes medicine remember to take it the way the doctor ordered.   Call the office if you cannot afford the medicine or if there are questions about it.     Notes: This goal was not a priority at the time this case was opened. Placement was needed by pt has been resistant wanting to stay at home with in home care which could not be arranged for her. Neither would CAP be sufficient for her needs. She was placed in LTC at [facility] eventually.    . COMPLETED: Track and Manage Symptoms-Heart Failure       Timeframe:  Long-Range Goal Priority:  Medium Start Date:       10/14/20                      Expected End Date:   01/23/21                    Follow Up Date 11/25/20   - begin a heart failure diary - develop a rescue plan - follow rescue plan if symptoms flare-up - know when to call the doctor    Why is this important?    You will be able to handle your symptoms better if you keep track of them.   Making some simple changes to your lifestyle will help.   Eating healthy is one thing you can do to take good care of yourself.    Notes: Not able to address this disease management goal as pt condition warranted priorty of LTC placement for care as pt could not take care of her BASIC needs. Case  closed.      Case has been closed at this time.  Eulah Pont. Myrtie Neither, MSN, [facility] Gerontological Nurse Practitioner [facility] Care Management [phone]

## 2020-12-26 DIAGNOSIS — H25813 Combined forms of age-related cataract, bilateral: Secondary | ICD-10-CM | POA: Diagnosis not present

## 2020-12-26 DIAGNOSIS — E119 Type 2 diabetes mellitus without complications: Secondary | ICD-10-CM | POA: Diagnosis not present

## 2020-12-26 DIAGNOSIS — H524 Presbyopia: Secondary | ICD-10-CM | POA: Diagnosis not present

## 2021-01-01 ENCOUNTER — Encounter: Payer: Self-pay | Admitting: Gastroenterology

## 2021-01-01 ENCOUNTER — Other Ambulatory Visit: Payer: Self-pay

## 2021-01-01 ENCOUNTER — Ambulatory Visit (INDEPENDENT_AMBULATORY_CARE_PROVIDER_SITE_OTHER): Payer: Medicare Other | Admitting: Gastroenterology

## 2021-01-01 DIAGNOSIS — K2100 Gastro-esophageal reflux disease with esophagitis, without bleeding: Secondary | ICD-10-CM | POA: Diagnosis not present

## 2021-01-01 DIAGNOSIS — K269 Duodenal ulcer, unspecified as acute or chronic, without hemorrhage or perforation: Secondary | ICD-10-CM | POA: Insufficient documentation

## 2021-01-01 MED ORDER — POLYETHYLENE GLYCOL 3350 17 G PO PACK: 17.0000 g | PACK | Freq: Every day | ORAL | 5 refills | Status: DC | PRN

## 2021-01-01 MED ORDER — PANTOPRAZOLE SODIUM 40 MG PO TBEC: 40.0000 mg | DELAYED_RELEASE_TABLET | Freq: Two times a day (BID) | ORAL | 1 refills | Status: DC

## 2021-01-01 NOTE — Patient Instructions (Addendum)
You have a history of reflux esophagitis and multiple duodenal ulcers seen on EGD November 12, 2020.  1. Recommend chronic PPI therapy.  Since you are having frequent regurgitation, we will switch omeprazole to pantoprazole 40 mg 30 minutes before breakfast and evening meal. 2. We will reassess in 6 months, potentially decreasing to once daily dosing at that time if you are doing well. 3. Would recommend avoiding NSAIDs given history of ulcers and chronic anticoagulation.  For your complaint of constipation, Miralax 17 grams once daily as needed.

## 2021-01-01 NOTE — Progress Notes (Signed)
Primary Care Physician: Caprice Renshaw, MD  Primary Gastroenterologist:  Formerly Dr. Oneida Alar  Chief Complaint  Patient presents with  . Gastroesophageal Reflux    Acid comes up in mouth    HPI: [name] is a 55 y.o. female here for hospital follow-up.  She has a history of alcohol use, chronic kidney disease, cocaine abuse, COPD, diabetes, hypertension, A. fib, seizures, stroke with recent hospitalization in January for vomiting and diarrhea.  She was found to have C. difficile infection.  GI was consulted for hematemesis.  Chronically takes NSAIDs (Advil PM).  EGD January 18 showed LA grade C esophagitis, nonbleeding superficial duodenal ulcers (at least 6).  Biopsies from the stomach benign, negative for H. pylori.  No plans for repeat EGD.  Patient subsequently admitted the following month (February) with COVID-19 pneumonia.  Resides at [facility]. States its driving her crazy to be there. States she does not have other options because she is homeless. BM infrequent, tries not to go because nobody cleans her. Regurgitation several times per week. No vomiting. No heartburn. No dysphagia. No melena, brbpr. No abdominal pain. Complains of fatigue since Covid.    EGD November 12, 2020: Impression:  - LA Grade C esophagitis with no bleeding. - Normal stomach. Biopsied. - Non-bleeding duodenal ulcers with a clean ulcer base (Forrest Class III). Gastric biopsies with mild nonspecific reactive gastropathy, no H. Pylori Per Dr. Jenetta Downer, no need to repeat EGD.  Colonoscopy July 2020: 1 simple adenoma removed.  Internal hemorrhoids.  Plans for next colonoscopy between 2025 and 2027.  Current Outpatient Medications  Medication Sig Dispense Refill  . acetaminophen (TYLENOL) 325 MG tablet Take 2 tablets (650 mg total) by mouth every 6 (six) hours as needed for headache, fever or mild pain.    Marland Kitchen albuterol (PROAIR HFA) 108 (90 Base) MCG/ACT inhaler INHALE 2 PUFFS EVERY 6 HOURS AS NEEDED  FOR SHORTNESS OF BREATH/WHEEZING. (Patient taking differently: Inhale 2 puffs into the lungs every 6 (six) hours as needed for wheezing or shortness of breath.) 8.5 g 2  . amLODipine (NORVASC) 10 MG tablet Take 1 tablet (10 mg total) by mouth daily. 30 tablet 5  . apixaban (ELIQUIS) 5 MG TABS tablet Take 1 tablet (5 mg total) by mouth 2 (two) times daily. Okay to restart Eliquis on 11/17/2020 60 tablet 3  . Calcium Carbonate Antacid (TUMS PO) Take 2 tablets by mouth every 8 (eight) hours as needed.    . DULoxetine (CYMBALTA) 60 MG capsule Take 1 capsule (60 mg total) by mouth daily. 30 capsule 3  . furosemide (LASIX) 20 MG tablet Take 1 tablet (20 mg total) by mouth daily. For fluid 30 tablet 2  . gabapentin (NEURONTIN) 300 MG capsule TAKE 1 CAPSULE BY MOUTH THREE TIMES A DAY. (Patient taking differently: Take 300 mg by mouth 3 (three) times daily.) 90 capsule 0  . guaiFENesin (MUCINEX) 600 MG 12 hr tablet Take 600 mg by mouth 2 (two) times daily.    . insulin glargine (LANTUS) 100 UNIT/ML injection Inject 0.14 mLs (14 Units total) into the skin at bedtime. 10 mL 11  . insulin lispro (ADMELOG) 100 UNIT/ML injection Inject into the skin 3 (three) times daily before meals. 151-200= 1 unit, 201-250= 2 units, 251-300= 4 units, 301-350= 6 units, 351-400=8 units, 401-500= 10  units    . levETIRAcetam (KEPPRA) 750 MG tablet Take 1 tablet (750 mg total) by mouth 2 (two) times daily. 60 tablet 0  . LORazepam (  ATIVAN) 0.5 MG tablet Take 0.5 mg by mouth every 8 (eight) hours as needed for anxiety.    . metoprolol succinate (TOPROL-XL) 100 MG 24 hr tablet Take 1 tablet (100 mg total) by mouth 2 (two) times daily. Take with or immediately following a meal. 60 tablet 3  . montelukast (SINGULAIR) 10 MG tablet TAKE (1) TABLET BY MOUTH AT BEDTIME. (Patient taking differently: Take 10 mg by mouth at bedtime.) 30 tablet 0  . omeprazole (PRILOSEC) 20 MG capsule Take 20 mg by mouth 2 (two) times daily.    Marland Kitchen oxybutynin  (DITROPAN-XL) 5 MG 24 hr tablet Take 5 mg by mouth at bedtime.    . predniSONE (DELTASONE) 50 MG tablet Take 1 tablet (50 mg total) by mouth daily with breakfast. 3 tablet 0  . rosuvastatin (CRESTOR) 10 MG tablet Take 1 tablet (10 mg total) by mouth daily. 30 tablet 0  . sitaGLIPtin (JANUVIA) 100 MG tablet Take 100 mg by mouth daily.    Marland Kitchen tiotropium (SPIRIVA HANDIHALER) 18 MCG inhalation capsule Place 18 mcg into inhaler and inhale daily as needed (Shortness of breath).    . traZODone (DESYREL) 50 MG tablet Take 1 tablet (50 mg total) by mouth at bedtime. 30 tablet 3  . vitamin C (ASCORBIC ACID) 500 MG tablet Take 500 mg by mouth daily.    Marland Kitchen VITAMIN D, CHOLECALCIFEROL, PO Take 5,000 Units by mouth daily.    Marland Kitchen zinc sulfate 220 (50 Zn) MG capsule Take 1 capsule (220 mg total) by mouth daily. 30 capsule 2   No current facility-administered medications for this visit.    Allergies as of 01/01/2021  . (No Known Allergies)    ROS:  General: Negative for anorexia, weight loss, fever, chills, fatigue, weakness. ENT: Negative for hoarseness, difficulty swallowing , nasal congestion. CV: Negative for chest pain, angina, palpitations, dyspnea on exertion, peripheral edema.  Respiratory: Negative for dyspnea at rest, dyspnea on exertion, cough, sputum, wheezing.  GI: See history of present illness. GU:  Negative for dysuria, hematuria, urinary incontinence, urinary frequency, nocturnal urination.  Endo: Negative for unusual weight change.    Physical Examination:   BP (!) 159/108   Pulse 77   Temp (!) 97.1 F (36.2 C) (Temporal)   Ht 5\' 1"  (1.549 m)   Wt 161 lb (73 kg) Comment: pt stated she was weighed yesterday  LMP 05/19/2016   BMI 30.42 kg/m   General: Well-nourished, well-developed in no acute distress.  Eyes: No icterus. Mouth: masked Lungs: Clear to auscultation bilaterally.  Heart: Regular rate and rhythm, no murmurs rubs or gallops.  Abdomen: Bowel sounds are normal,  nontender, nondistended, no hepatosplenomegaly or masses, no abdominal bruits or hernia , no rebound or guarding.   Extremities: No lower extremity edema. No clubbing or deformities. Neuro: Alert and oriented x 4   Skin: Warm and dry, no jaundice.   Psych: Alert and cooperative, normal mood and affect.  Labs:  Lab Results  Component Value Date   WBC 6.8 12/07/2020   HGB 12.2 12/07/2020   HCT 39.6 12/07/2020   MCV 92.1 12/07/2020   PLT 404 (H) 12/07/2020   Lab Results  Component Value Date   ALT 16 12/07/2020   AST 20 12/07/2020   ALKPHOS 51 12/07/2020   BILITOT 0.6 12/07/2020   Lab Results  Component Value Date   CREATININE 1.55 (H) 12/07/2020   BUN 41 (H) 12/07/2020   NA 139 12/07/2020   K 4.0 12/07/2020   CL 104  12/07/2020   CO2 26 12/07/2020   Lab Results  Component Value Date   FERRITIN 471 (H) 12/07/2020    Imaging Studies: DG Chest Port 1 View  Result Date: 12/03/2020 CLINICAL DATA:  Shortness of breath EXAM: PORTABLE CHEST 1 VIEW COMPARISON:  11/02/2020 FINDINGS: Bilateral interstitial and patchy alveolar airspace opacities. No pleural effusion or pneumothorax. Stable cardiomegaly. No acute osseous abnormality. IMPRESSION: Bilateral interstitial and patchy alveolar airspace opacities concerning for multilobar pneumonia including atypical viral pneumonia. Electronically Signed   By: Kathreen Devoid   On: 12/03/2020 14:37    Assessment/plan:  55 year old female with multiple medical issues as outlined above, seen during recent hospitalization in January for vomiting, hematemesis, diarrhea.  Chronically using NSAIDs at that point.  EGD showed LA grade C esophagitis, nonbleeding superficial duodenal ulcers with benign biopsies, negative H. pylori.  She was found to have C. difficile infection and was treated.  Overall patient stable from a GI standpoint.  She complains more of constipation.  Infrequent bowel movements.  Denies any ongoing heartburn, vomiting.  She has  occasional regurgitation few times per week.  GERD suboptimally controlled on omeprazole 20 mg twice daily.  Duodenal ulcers have been adequately treated with PPI therapy.  We will continue to avoid NSAIDs given her history of ulcers and chronic anticoagulation.  For constipation, add MiraLAX 17 g daily as needed.   1. Recommend chronic PPI therapy for history of reflux esophagitis and multiple duodenal ulcers on EGD in January 2022.  Since you are having frequent regurgitation, we will switch from omeprazole to pantoprazole 40 mg 30 minutes before breakfast and evening meal. 2. We will reassess in 6 months, potentially decreasing to once daily dosing at next office visit if you are doing well. 3. Recommend avoiding NSAIDs given history of ulcers and chronic anticoagulation. 4. Miralax one capful once daily as needed.

## 2021-01-02 ENCOUNTER — Encounter: Payer: Medicare Other | Admitting: Family Medicine

## 2021-01-06 DIAGNOSIS — J449 Chronic obstructive pulmonary disease, unspecified: Secondary | ICD-10-CM | POA: Diagnosis not present

## 2021-01-06 DIAGNOSIS — I5030 Unspecified diastolic (congestive) heart failure: Secondary | ICD-10-CM | POA: Diagnosis not present

## 2021-01-06 DIAGNOSIS — E119 Type 2 diabetes mellitus without complications: Secondary | ICD-10-CM | POA: Diagnosis not present

## 2021-01-06 DIAGNOSIS — I10 Essential (primary) hypertension: Secondary | ICD-10-CM | POA: Diagnosis not present

## 2021-01-06 DIAGNOSIS — K219 Gastro-esophageal reflux disease without esophagitis: Secondary | ICD-10-CM | POA: Diagnosis not present

## 2021-01-07 ENCOUNTER — Encounter: Payer: Medicare Other | Admitting: Family Medicine

## 2021-01-09 DIAGNOSIS — M6281 Muscle weakness (generalized): Secondary | ICD-10-CM | POA: Diagnosis not present

## 2021-01-09 DIAGNOSIS — R2689 Other abnormalities of gait and mobility: Secondary | ICD-10-CM | POA: Diagnosis not present

## 2021-01-09 DIAGNOSIS — G629 Polyneuropathy, unspecified: Secondary | ICD-10-CM | POA: Diagnosis not present

## 2021-01-09 DIAGNOSIS — I69054 Hemiplegia and hemiparesis following nontraumatic subarachnoid hemorrhage affecting left non-dominant side: Secondary | ICD-10-CM | POA: Diagnosis not present

## 2021-01-09 DIAGNOSIS — I639 Cerebral infarction, unspecified: Secondary | ICD-10-CM | POA: Diagnosis not present

## 2021-01-09 DIAGNOSIS — J449 Chronic obstructive pulmonary disease, unspecified: Secondary | ICD-10-CM | POA: Diagnosis not present

## 2021-01-10 DIAGNOSIS — M6281 Muscle weakness (generalized): Secondary | ICD-10-CM | POA: Diagnosis not present

## 2021-01-10 DIAGNOSIS — R2689 Other abnormalities of gait and mobility: Secondary | ICD-10-CM | POA: Diagnosis not present

## 2021-01-10 DIAGNOSIS — R062 Wheezing: Secondary | ICD-10-CM | POA: Diagnosis not present

## 2021-01-10 DIAGNOSIS — I509 Heart failure, unspecified: Secondary | ICD-10-CM | POA: Diagnosis not present

## 2021-01-10 DIAGNOSIS — J449 Chronic obstructive pulmonary disease, unspecified: Secondary | ICD-10-CM | POA: Diagnosis not present

## 2021-01-13 DIAGNOSIS — J449 Chronic obstructive pulmonary disease, unspecified: Secondary | ICD-10-CM | POA: Diagnosis not present

## 2021-01-13 DIAGNOSIS — M6281 Muscle weakness (generalized): Secondary | ICD-10-CM | POA: Diagnosis not present

## 2021-01-13 DIAGNOSIS — R2689 Other abnormalities of gait and mobility: Secondary | ICD-10-CM | POA: Diagnosis not present

## 2021-01-14 DIAGNOSIS — R2689 Other abnormalities of gait and mobility: Secondary | ICD-10-CM | POA: Diagnosis not present

## 2021-01-14 DIAGNOSIS — J449 Chronic obstructive pulmonary disease, unspecified: Secondary | ICD-10-CM | POA: Diagnosis not present

## 2021-01-14 DIAGNOSIS — M6281 Muscle weakness (generalized): Secondary | ICD-10-CM | POA: Diagnosis not present

## 2021-01-15 DIAGNOSIS — J449 Chronic obstructive pulmonary disease, unspecified: Secondary | ICD-10-CM | POA: Diagnosis not present

## 2021-01-15 DIAGNOSIS — M6281 Muscle weakness (generalized): Secondary | ICD-10-CM | POA: Diagnosis not present

## 2021-01-15 DIAGNOSIS — R2689 Other abnormalities of gait and mobility: Secondary | ICD-10-CM | POA: Diagnosis not present

## 2021-01-15 DIAGNOSIS — R269 Unspecified abnormalities of gait and mobility: Secondary | ICD-10-CM | POA: Diagnosis not present

## 2021-01-15 DIAGNOSIS — J441 Chronic obstructive pulmonary disease with (acute) exacerbation: Secondary | ICD-10-CM | POA: Diagnosis not present

## 2021-01-15 DIAGNOSIS — I639 Cerebral infarction, unspecified: Secondary | ICD-10-CM | POA: Diagnosis not present

## 2021-01-15 NOTE — Progress Notes (Signed)
Cardiology Office Note   Date:  01/16/2021   ID:  [name], DOB 1966-04-17, MRN [id number]  PCP:  Caprice Renshaw, MD  Cardiologist:   Dorris Carnes, MD       History of Present Illness: [name] is a 55 y.o. female with a history of reported NICM  Echo in 2018 LVEF 30 to 35%, PAF and atrial tachycardia   SHe was previously followed by [facility] in Feb 2021 Echo in October 2021 noted below  LVEF improved   The pt was admitted in Feb 2022  Admitted with pneumonia and COVID (1-2 wks prior)  Hypoxic   Treated with IV ABX   The pt had afib with RVR, GI bleed, DKA, C diff diarrhea.  Treated  Discharged to a SNF  The pt says since d/c she denies palpitations  NO CP   Has some congestion but no cough  No fevers.    She does say that with change in positon she gets dizzy   NO syncope        Current Meds  Medication Sig  . acetaminophen (TYLENOL) 325 MG tablet Take 2 tablets (650 mg total) by mouth every 6 (six) hours as needed for headache, fever or mild pain.  Marland Kitchen albuterol (PROAIR HFA) 108 (90 Base) MCG/ACT inhaler INHALE 2 PUFFS EVERY 6 HOURS AS NEEDED FOR SHORTNESS OF BREATH/WHEEZING. (Patient taking differently: Inhale 2 puffs into the lungs every 6 (six) hours as needed for wheezing or shortness of breath.)  . amLODipine (NORVASC) 10 MG tablet Take 1 tablet (10 mg total) by mouth daily.  Marland Kitchen apixaban (ELIQUIS) 5 MG TABS tablet Take 1 tablet (5 mg total) by mouth 2 (two) times daily. Okay to restart Eliquis on 11/17/2020  . Calcium Carbonate Antacid (TUMS PO) Take 2 tablets by mouth every 8 (eight) hours as needed.  . DULoxetine (CYMBALTA) 60 MG capsule Take 1 capsule (60 mg total) by mouth daily.  . furosemide (LASIX) 20 MG tablet Take 1 tablet (20 mg total) by mouth daily. For fluid  . gabapentin (NEURONTIN) 300 MG capsule TAKE 1 CAPSULE BY MOUTH THREE TIMES A DAY. (Patient taking differently: Take 300 mg by mouth 3 (three) times daily.)  . guaiFENesin (MUCINEX) 600 MG 12  hr tablet Take 600 mg by mouth 2 (two) times daily.  . insulin glargine (LANTUS) 100 UNIT/ML injection Inject 0.14 mLs (14 Units total) into the skin at bedtime.  . insulin lispro (HUMALOG) 100 UNIT/ML injection Inject into the skin 3 (three) times daily before meals. 151-200= 1 unit, 201-250= 2 units, 251-300= 4 units, 301-350= 6 units, 351-400=8 units, 401-500= 10  units  . levETIRAcetam (KEPPRA) 750 MG tablet Take 1 tablet (750 mg total) by mouth 2 (two) times daily.  Marland Kitchen LORazepam (ATIVAN) 0.5 MG tablet Take 0.5 mg by mouth every 8 (eight) hours as needed for anxiety.  . metoprolol succinate (TOPROL-XL) 100 MG 24 hr tablet Take 1 tablet (100 mg total) by mouth 2 (two) times daily. Take with or immediately following a meal.  . montelukast (SINGULAIR) 10 MG tablet TAKE (1) TABLET BY MOUTH AT BEDTIME. (Patient taking differently: Take 10 mg by mouth at bedtime.)  . oxybutynin (DITROPAN-XL) 5 MG 24 hr tablet Take 5 mg by mouth at bedtime.  . pantoprazole (PROTONIX) 40 MG tablet Take 1 tablet (40 mg total) by mouth 2 (two) times daily before a meal.  . polyethylene glycol (MIRALAX) 17 g packet Take 17 g by mouth daily  as needed.  . predniSONE (DELTASONE) 50 MG tablet Take 1 tablet (50 mg total) by mouth daily with breakfast.  . rosuvastatin (CRESTOR) 10 MG tablet Take 1 tablet (10 mg total) by mouth daily.  . sitaGLIPtin (JANUVIA) 100 MG tablet Take 100 mg by mouth daily.  Marland Kitchen tiotropium (SPIRIVA HANDIHALER) 18 MCG inhalation capsule Place 18 mcg into inhaler and inhale daily as needed (Shortness of breath).  . traZODone (DESYREL) 50 MG tablet Take 1 tablet (50 mg total) by mouth at bedtime.  . vitamin C (ASCORBIC ACID) 500 MG tablet Take 500 mg by mouth daily.  Marland Kitchen VITAMIN D, CHOLECALCIFEROL, PO Take 5,000 Units by mouth daily.  Marland Kitchen zinc sulfate 220 (50 Zn) MG capsule Take 1 capsule (220 mg total) by mouth daily.     Allergies:   Patient has no known allergies.   Past Medical History:  Diagnosis Date   . Alcohol use   . Ankle fracture, lateral malleolus, closed 2013  . Anxiety   . Breast mass, left 2013  . Chronic anemia   . CKD (chronic kidney disease), stage III ([place])   . Cocaine abuse ([place])   . COPD (chronic obstructive pulmonary disease) ([place])   . Diabetes mellitus, type 2 ([place])   . Diabetic Charcot foot ([place])   . Essential hypertension   . History of cardiomyopathy   . History of GI bleed 2011  . Hyperlipidemia   . Noncompliance   . Obesity   . Panic attacks   . PAT (paroxysmal atrial tachycardia) ([facility])    Previously on Amiodarone  . Seizures ([place]) 10/01/2020  . Sleep apnea    Not on CPAP  . Stroke ([place]) 2018  . Tobacco abuse   . Urinary incontinence     Past Surgical History:  Procedure Laterality Date  . BIOPSY  11/12/2020   Procedure: BIOPSY;  Surgeon: Harvel Quale, MD;  [facility];  Service: Gastroenterology;;  . BREAST BIOPSY    . CARDIAC CATHETERIZATION N/A 07/28/2016   Procedure: Left Heart Cath and Coronary Angiography;  Surgeon: Jettie Booze, MD;  Location: [facility];  Service: Cardiovascular;  Laterality: N/A;  . COLONOSCOPY N/A 05/10/2019   Procedure: COLONOSCOPY;  Surgeon: Danie Binder, MD;  [facility];  Service: Endoscopy;  Laterality: N/A;  Phenergan 12.5 mg IV in pre-op  . DILATION AND CURETTAGE OF UTERUS    . ESOPHAGOGASTRODUODENOSCOPY (EGD) WITH PROPOFOL N/A 11/12/2020   Procedure: ESOPHAGOGASTRODUODENOSCOPY (EGD) WITH PROPOFOL;  Surgeon: Harvel Quale, MD;  [facility];  Service: Gastroenterology;  Laterality: N/A;  . I & D EXTREMITY Bilateral 09/22/2017   Procedure: BILATERAL DEBRIDEMENT LEG/FOOT ULCERS, APPLY VERAFLO WOUND VAC;  Surgeon: Newt Minion, MD;  Location: [facility];  Service: Orthopedics;  Laterality: Bilateral;  . I & D EXTREMITY Right 10/11/2018   Procedure: IRRIGATION AND DEBRIDEMENT RIGHT HAND;  Surgeon: [name], MD;  Location: [facility];  Service:  Orthopedics;  Laterality: Right;  . I & D EXTREMITY Right 10/13/2018   Procedure: REPEAT IRRIGATION AND DEBRIDEMENT RIGHT HAND;  Surgeon: [name], MD;  Location: [facility];  Service: Orthopedics;  Laterality: Right;  . I & D EXTREMITY Right 11/22/2018   Procedure: IRRIGATION AND DEBRIDEMENT AND PINNING RIGHT HAND;  Surgeon: [name], MD;  Location: [facility];  Service: Orthopedics;  Laterality: Right;  . IR RADIOLOGIST EVAL & MGMT  07/05/2018  . POLYPECTOMY  05/10/2019   Procedure: POLYPECTOMY;  Surgeon: Barney Drain  L, MD;  [facility];  Service: Endoscopy;;  . SKIN SPLIT GRAFT Bilateral 09/28/2017   Procedure: BILATERAL SPLIT THICKNESS SKIN GRAFT LEGS/FEET AND APPLY VAC;  Surgeon: Newt Minion, MD;  Location: [facility];  Service: Orthopedics;  Laterality: Bilateral;  . SKIN SPLIT GRAFT Right 11/22/2018   Procedure: SKIN GRAFT SPLIT THICKNESS;  Surgeon: [name], MD;  Location: [facility];  Service: Orthopedics;  Laterality: Right;     Social History:  The patient  reports that she has been smoking cigarettes. She has a 20.00 pack-year smoking history. She has never used smokeless tobacco. She reports previous alcohol use. She reports previous drug use. Frequency: 3.00 times per week. Drugs: Marijuana and Cocaine.   Family History:  The patient's family history includes Arthritis in an other family member; Asthma in an other family member; Cancer in her sister and another family member; Diabetes in an other family member; Heart attack in her mother; Hypertension in her brother, daughter, father, mother, sister, sister, and son.    ROS:  Please see the history of present illness. All other systems are reviewed and  Negative to the above problem except as noted.    PHYSICAL EXAM: VS:  BP (!) 142/80 (BP Location: Left Arm, Patient Position: Sitting, Cuff Size: Normal)   Pulse 85   Ht 5\' 1"  (1.549 m)   Wt 194 lb (88 kg)   LMP 05/19/2016   SpO2 95%   BMI 36.66 kg/m    BP  sitting   150/90   P 85   Standing   144/72   P 91       GEN: Morbidly obese 55 yo  in no acute distress  HEENT: normal  Neck: no JVD, carotid bruit Cardiac: RRR; no murmurs  Trivial LE edema  Respiratory:  clear to auscultation bilaterally,  GI: soft, nontender, nondistended, + BS  No hepatomegaly  MS: no deformity Moving all extremities   Skin: warm and dry, no rash Neuro:  Strength and sensation are intact Psych: euthymic mood, full affect   EKG:  EKG is not ordered today.   Echo 08/14/20  1. Left ventricular ejection fraction, by estimation, is 50 to 55%. The left ventricle has low normal function. The left ventricle has no regional wall motion abnormalities. There is moderate left ventricular hypertrophy. Left ventricular diastolic parameters are indeterminate. 2. Right ventricular systolic function is normal. The right ventricular size is normal. There is normal pulmonary artery systolic pressure. The estimated right ventricular systolic pressure is 58.5 mmHg. 3. Left atrial size was mild to moderately dilated. 4. The mitral valve is grossly normal. Mild mitral valve regurgitation. 5. The aortic valve is tricuspid. Aortic valve regurgitation is not visualized. 6. The inferior vena cava is normal in size with greater than 50% respiratory variability, suggesting right atrial pressure of 3 mmHg.  Lipid Panel    Component Value Date/Time   CHOL 240 (H) 08/23/2020 0248   TRIG 261 (H) 12/03/2020 1432   HDL 34 (L) 08/23/2020 0248   CHOLHDL 7.1 08/23/2020 0248   VLDL 26 08/23/2020 0248   LDLCALC 180 (H) 08/23/2020 0248   LDLCALC 73 12/07/2018 1543   LDLDIRECT 122 (H) 01/08/2010 0818      Wt Readings from Last 3 Encounters:  01/16/21 194 lb (88 kg)  01/01/21 161 lb (73 kg)  12/03/20 174 lb (78.9 kg)      ASSESSMENT AND PLAN:  1  PAF   Pt with PAF  Clinically in  SR    Keep on current regimen   2  Dizziness  Pt notes some dizziness with change in position    Not  orthostatic on exam   Keep on same regimen   2  Hx of CHF   Pt had echo in Oct 2021with imprved LVEF  NOw Pt complains of some congestion since recent admission   Will repeat echo to reeval LVEF  Check BMET , BNP as well    No changes in meds    3  HTN  BP is fair   Keep on current regimen    4 CKD  Will check labs today    5 DM   Continue insulin  6  HL   Continue Crestor       Current medicines are reviewed at length with the patient today.  The patient does not have concerns regarding medicines.  Signed, Dorris Carnes, MD  01/16/2021 [facility] [place], [place], [place]  [address] Phone: [phone]; Fax: [phone]

## 2021-01-16 ENCOUNTER — Ambulatory Visit (INDEPENDENT_AMBULATORY_CARE_PROVIDER_SITE_OTHER): Payer: Medicare Other | Admitting: Internal Medicine

## 2021-01-16 ENCOUNTER — Encounter: Payer: Self-pay | Admitting: Internal Medicine

## 2021-01-16 ENCOUNTER — Other Ambulatory Visit: Payer: Self-pay

## 2021-01-16 ENCOUNTER — Other Ambulatory Visit (HOSPITAL_COMMUNITY)
Admission: RE | Admit: 2021-01-16 | Discharge: 2021-01-16 | Disposition: A | Discharge status: Home / Self Care | Payer: Medicare Other | Source: Ambulatory Visit | Attending: Internal Medicine | Admitting: Internal Medicine

## 2021-01-16 VITALS — BP 142/80 | HR 85 | Ht 61.0 in | Wt 194.0 lb

## 2021-01-16 DIAGNOSIS — M6281 Muscle weakness (generalized): Secondary | ICD-10-CM | POA: Diagnosis not present

## 2021-01-16 DIAGNOSIS — I69054 Hemiplegia and hemiparesis following nontraumatic subarachnoid hemorrhage affecting left non-dominant side: Secondary | ICD-10-CM | POA: Diagnosis not present

## 2021-01-16 DIAGNOSIS — I10 Essential (primary) hypertension: Secondary | ICD-10-CM

## 2021-01-16 DIAGNOSIS — J449 Chronic obstructive pulmonary disease, unspecified: Secondary | ICD-10-CM | POA: Diagnosis not present

## 2021-01-16 DIAGNOSIS — R2689 Other abnormalities of gait and mobility: Secondary | ICD-10-CM | POA: Diagnosis not present

## 2021-01-16 DIAGNOSIS — Z79899 Other long term (current) drug therapy: Secondary | ICD-10-CM

## 2021-01-16 LAB — BASIC METABOLIC PANEL
Anion gap: 10 (ref 5–15)
BUN: 36 mg/dL — ABNORMAL HIGH (ref 6–20)
CO2: 26 mmol/L (ref 22–32)
Calcium: 9 mg/dL (ref 8.9–10.3)
Chloride: 107 mmol/L (ref 98–111)
Creatinine, Ser: 1.3 mg/dL — ABNORMAL HIGH (ref 0.44–1.00)
GFR, Estimated: 48 mL/min — ABNORMAL LOW (ref >60)
Glucose, Bld: 198 mg/dL — ABNORMAL HIGH (ref 70–99)
Potassium: 4.5 mmol/L (ref 3.5–5.1)
Sodium: 143 mmol/L (ref 135–145)

## 2021-01-16 LAB — CBC
HCT: 36.7 % (ref 36.0–46.0)
Hemoglobin: 11 g/dL — ABNORMAL LOW (ref 12.0–15.0)
MCH: 29.3 pg (ref 26.0–34.0)
MCHC: 30 g/dL (ref 30.0–36.0)
MCV: 97.6 fL (ref 80.0–100.0)
Platelets: 232 10*3/uL (ref 150–400)
RBC: 3.76 MIL/uL — ABNORMAL LOW (ref 3.87–5.11)
RDW: 16.7 % — ABNORMAL HIGH (ref 11.5–15.5)
WBC: 7.6 10*3/uL (ref 4.0–10.5)
nRBC: 0.3 % — ABNORMAL HIGH (ref 0.0–0.2)

## 2021-01-16 LAB — BRAIN NATRIURETIC PEPTIDE: B Natriuretic Peptide: 352 pg/mL — ABNORMAL HIGH (ref 0.0–100.0)

## 2021-01-16 NOTE — Patient Instructions (Signed)
Medication Instructions:  Your physician recommends that you continue on your current medications as directed. Please refer to the Current Medication list given to you today.  *If you need a refill on your cardiac medications before your next appointment, please call your pharmacy*   Lab Work: Your physician recommends that you return for lab work in: Today   If you have labs (blood work) drawn today and your tests are completely normal, you will receive your results only by: Marland Kitchen MyChart Message (if you have MyChart) OR . A paper copy in the mail If you have any lab test that is abnormal or we need to change your treatment, we will call you to review the results.   Testing/Procedures: Your physician has requested that you have an echocardiogram. Echocardiography is a painless test that uses sound waves to create images of your heart. It provides your doctor with information about the size and shape of your heart and how well your heart's chambers and valves are working. This procedure takes approximately one hour. There are no restrictions for this procedure.    Follow-Up: At [facility], you and your health needs are our priority.  As part of our continuing mission to provide you with exceptional heart care, we have created designated Provider Care Teams.  These Care Teams include your primary Cardiologist (physician) and Advanced Practice Providers (APPs -  Physician Assistants and Nurse Practitioners) who all work together to provide you with the care you need, when you need it.  We recommend signing up for the patient portal called "MyChart".  Sign up information is provided on this After Visit Summary.  MyChart is used to connect with patients for Virtual Visits (Telemedicine).  Patients are able to view lab/test results, encounter notes, upcoming appointments, etc.  Non-urgent messages can be sent to your provider as well.   To learn more about what you can do with MyChart, go to  [email or web address].    Your next appointment:    April   The format for your next appointment:   In Person  Provider:   Dr. Harrington Challenger    Other Instructions  Please check blood pressure and heart rate 3 times weekly   Thank you for choosing [facility]!

## 2021-01-21 DIAGNOSIS — J449 Chronic obstructive pulmonary disease, unspecified: Secondary | ICD-10-CM | POA: Diagnosis not present

## 2021-01-21 DIAGNOSIS — R1310 Dysphagia, unspecified: Secondary | ICD-10-CM | POA: Diagnosis not present

## 2021-01-21 DIAGNOSIS — M6281 Muscle weakness (generalized): Secondary | ICD-10-CM | POA: Diagnosis not present

## 2021-01-21 DIAGNOSIS — R2689 Other abnormalities of gait and mobility: Secondary | ICD-10-CM | POA: Diagnosis not present

## 2021-01-29 NOTE — Progress Notes (Signed)
PATIENT: [name] DOB: [birth year]  REASON FOR VISIT: follow up HISTORY FROM: patient  HISTORY OF PRESENT ILLNESS: Today 01/30/21 [name] is a 55 year old female who had a seizure in October 2021, she was admitted in DKA with a blood sugar over 700.  She was placed on Keppra.  Noted to have a small subcortical stroke in the right basal ganglia area, has some left-sided weakness.  No further seizures.  Hospitalized in February 2022 for Covid pneumonia. She lives in a [place] [place] in [place], since October, trying to find an [place] facility, doesn't have anyone to take care of her at home.  Is nonambulatory, can stand and pivot.  No further seizures.  Continues to have poor control diabetes, reports blood sugar reads "high" when checked at facility.  Last A1c in February was 11.1.  Remains on Eliquis.  Here today for evaluation unaccompanied.  HISTORY  10/01/2020 Dr. Jannifer Franklin: [name] is a 55 year old right-handed black female with a history of diabetes, tobacco abuse, cocaine abuse, chronic renal insufficiency, atrial fibrillation, congestive heart failure, and recent onset of seizure.  The patient was admitted to the hospital on 08/13/2020, the patient claims that she was found at home having a seizure.  The patient was found to have diabetic ketoacidosis with a blood sugar of 772 on admission.  The patient admits to eating pure starch on a regular basis.  She was placed on Keppra in the hospital, at least 2 EEG studies were done, both showing generalized slowing consistent with a metabolic encephalopathy.  The patient underwent lumbar puncture showing 10 white cells with a normal glucose level and a slightly elevated protein level, no evidence of meningitis or herpes encephalitis was identified.  The patient has been noted to have a small subcortical stroke in the right basal ganglia area, she has had some left-sided weakness.  Since the hospitalization she has not been able to  walk with some left-sided weakness.  The patient can take a few steps with a walker.  She wants to go home but she does not have any caretakers at home and she would require 24/7 assistance.  The patient initially had significant dysphagia but this is now improving.  The patient claims that her blood sugar remains in the 400 range but then admits to eating snacks that she gets from her roommate in the evening.  The patient does have urinary frequency and some urinary incontinence.  She has occasional headaches, she denies any vision changes.  She has not had any further seizures since coming out of the hospital.  She comes to this office for an evaluation.  REVIEW OF SYSTEMS: Out of a complete 14 system review of symptoms, the patient complains only of the following symptoms, and all other reviewed systems are negative.  See HPI  ALLERGIES: No Known Allergies  HOME MEDICATIONS: Outpatient Medications Prior to Visit  Medication Sig Dispense Refill  . albuterol (PROAIR HFA) 108 (90 Base) MCG/ACT inhaler INHALE 2 PUFFS EVERY 6 HOURS AS NEEDED FOR SHORTNESS OF BREATH/WHEEZING. (Patient taking differently: Inhale 2 puffs into the lungs every 6 (six) hours as needed for wheezing or shortness of breath.) 8.5 g 2  . amLODipine (NORVASC) 10 MG tablet Take 1 tablet (10 mg total) by mouth daily. 30 tablet 5  . apixaban (ELIQUIS) 5 MG TABS tablet Take 1 tablet (5 mg total) by mouth 2 (two) times daily. Okay to restart Eliquis on 11/17/2020 60 tablet 3  . Calcium Carbonate  Antacid (TUMS PO) Take 2 tablets by mouth every 8 (eight) hours as needed.    . DULoxetine (CYMBALTA) 60 MG capsule Take 1 capsule (60 mg total) by mouth daily. 30 capsule 3  . furosemide (LASIX) 20 MG tablet Take 1 tablet (20 mg total) by mouth daily. For fluid 30 tablet 2  . gabapentin (NEURONTIN) 300 MG capsule TAKE 1 CAPSULE BY MOUTH THREE TIMES A DAY. (Patient taking differently: Take 300 mg by mouth 3 (three) times daily.) 90 capsule 0   . guaiFENesin (MUCINEX) 600 MG 12 hr tablet Take 600 mg by mouth 2 (two) times daily.    . insulin glargine (LANTUS) 100 UNIT/ML injection Inject 0.14 mLs (14 Units total) into the skin at bedtime. 10 mL 11  . insulin lispro (HUMALOG) 100 UNIT/ML injection Inject into the skin 3 (three) times daily before meals. 151-200= 1 unit, 201-250= 2 units, 251-300= 4 units, 301-350= 6 units, 351-400=8 units, 401-500= 10  units    . LORazepam (ATIVAN) 0.5 MG tablet Take 0.5 mg by mouth every 8 (eight) hours as needed for anxiety.    . metoprolol succinate (TOPROL-XL) 100 MG 24 hr tablet Take 1 tablet (100 mg total) by mouth 2 (two) times daily. Take with or immediately following a meal. 60 tablet 3  . oxybutynin (DITROPAN-XL) 5 MG 24 hr tablet Take 5 mg by mouth at bedtime.    . pantoprazole (PROTONIX) 40 MG tablet Take 1 tablet (40 mg total) by mouth 2 (two) times daily before a meal. 180 tablet 1  . polyethylene glycol (MIRALAX) 17 g packet Take 17 g by mouth daily as needed. 30 each 5  . predniSONE (DELTASONE) 50 MG tablet Take 1 tablet (50 mg total) by mouth daily with breakfast. 3 tablet 0  . rosuvastatin (CRESTOR) 10 MG tablet Take 1 tablet (10 mg total) by mouth daily. 30 tablet 0  . sitaGLIPtin (JANUVIA) 100 MG tablet Take 100 mg by mouth daily.    Marland Kitchen tiotropium (SPIRIVA HANDIHALER) 18 MCG inhalation capsule Place 18 mcg into inhaler and inhale daily as needed (Shortness of breath).    . traZODone (DESYREL) 50 MG tablet Take 1 tablet (50 mg total) by mouth at bedtime. 30 tablet 3  . vitamin C (ASCORBIC ACID) 500 MG tablet Take 500 mg by mouth daily.    Marland Kitchen VITAMIN D, CHOLECALCIFEROL, PO Take 5,000 Units by mouth daily.    Marland Kitchen zinc sulfate 220 (50 Zn) MG capsule Take 1 capsule (220 mg total) by mouth daily. 30 capsule 2  . levETIRAcetam (KEPPRA) 750 MG tablet Take 1 tablet (750 mg total) by mouth 2 (two) times daily. 60 tablet 0  . acetaminophen (TYLENOL) 325 MG tablet Take 2 tablets (650 mg total) by  mouth every 6 (six) hours as needed for headache, fever or mild pain.    . montelukast (SINGULAIR) 10 MG tablet TAKE (1) TABLET BY MOUTH AT BEDTIME. (Patient taking differently: Take 10 mg by mouth at bedtime.) 30 tablet 0   No facility-administered medications prior to visit.    PAST MEDICAL HISTORY: Past Medical History:  Diagnosis Date  . Alcohol use   . Ankle fracture, lateral malleolus, closed 2013  . Anxiety   . Breast mass, left 2013  . Chronic anemia   . CKD (chronic kidney disease), stage III ([place])   . Cocaine abuse ([place])   . COPD (chronic obstructive pulmonary disease) ([place])   . Diabetes mellitus, type 2 ([place])   . Diabetic Charcot foot (  [place])   . Essential hypertension   . History of cardiomyopathy   . History of GI bleed 2011  . Hyperlipidemia   . Noncompliance   . Obesity   . Panic attacks   . PAT (paroxysmal atrial tachycardia) ([facility])    Previously on Amiodarone  . Seizures ([place]) 10/01/2020  . Sleep apnea    Not on CPAP  . Stroke ([facility]) 2018  . Tobacco abuse   . Urinary incontinence     PAST SURGICAL HISTORY: Past Surgical History:  Procedure Laterality Date  . BIOPSY  11/12/2020   Procedure: BIOPSY;  Surgeon: Harvel Quale, MD;  [facility];  Service: Gastroenterology;;  . BREAST BIOPSY    . CARDIAC CATHETERIZATION N/A 07/28/2016   Procedure: Left Heart Cath and Coronary Angiography;  Surgeon: Jettie Booze, MD;  Location: [facility];  Service: Cardiovascular;  Laterality: N/A;  . COLONOSCOPY N/A 05/10/2019   Procedure: COLONOSCOPY;  Surgeon: Danie Binder, MD;  [facility];  Service: Endoscopy;  Laterality: N/A;  Phenergan 12.5 mg IV in pre-op  . DILATION AND CURETTAGE OF UTERUS    . ESOPHAGOGASTRODUODENOSCOPY (EGD) WITH PROPOFOL N/A 11/12/2020   Procedure: ESOPHAGOGASTRODUODENOSCOPY (EGD) WITH PROPOFOL;  Surgeon: Harvel Quale, MD;  [facility];  Service: Gastroenterology;  Laterality:  N/A;  . I & D EXTREMITY Bilateral 09/22/2017   Procedure: BILATERAL DEBRIDEMENT LEG/FOOT ULCERS, APPLY VERAFLO WOUND VAC;  Surgeon: Newt Minion, MD;  Location: [facility];  Service: Orthopedics;  Laterality: Bilateral;  . I & D EXTREMITY Right 10/11/2018   Procedure: IRRIGATION AND DEBRIDEMENT RIGHT HAND;  Surgeon: [name], MD;  Location: [facility];  Service: Orthopedics;  Laterality: Right;  . I & D EXTREMITY Right 10/13/2018   Procedure: REPEAT IRRIGATION AND DEBRIDEMENT RIGHT HAND;  Surgeon: [name], MD;  Location: [facility];  Service: Orthopedics;  Laterality: Right;  . I & D EXTREMITY Right 11/22/2018   Procedure: IRRIGATION AND DEBRIDEMENT AND PINNING RIGHT HAND;  Surgeon: [name], MD;  Location: [facility];  Service: Orthopedics;  Laterality: Right;  . IR RADIOLOGIST EVAL & MGMT  07/05/2018  . POLYPECTOMY  05/10/2019   Procedure: POLYPECTOMY;  Surgeon: Danie Binder, MD;  [facility];  Service: Endoscopy;;  . SKIN SPLIT GRAFT Bilateral 09/28/2017   Procedure: BILATERAL SPLIT THICKNESS SKIN GRAFT LEGS/FEET AND APPLY VAC;  Surgeon: Newt Minion, MD;  Location: [facility];  Service: Orthopedics;  Laterality: Bilateral;  . SKIN SPLIT GRAFT Right 11/22/2018   Procedure: SKIN GRAFT SPLIT THICKNESS;  Surgeon: [name], MD;  Location: [facility];  Service: Orthopedics;  Laterality: Right;    FAMILY HISTORY: Family History  Problem Relation Age of Onset  . Hypertension Mother   . Heart attack Mother   . Hypertension Father        CABG   . Hypertension Sister   . Hypertension Brother   . Hypertension Sister   . Cancer Sister        breast   . Arthritis Other   . Cancer Other   . Diabetes Other   . Asthma Other   . Hypertension Daughter   . Hypertension Son     SOCIAL HISTORY: Social History   Socioeconomic History  . Marital status: Widowed    Spouse name: Not on file  . Number of children: 2  . Years of education: 9  . Highest education level: 9th grade   Occupational History  .  Occupation: Disabled  Tobacco Use  . Smoking status: Current Some Day Smoker    Packs/day: 1.00    Years: 20.00    Pack years: 20.00    Types: Cigarettes    Last attempt to quit: 06/03/2017    Years since quitting: 3.6  . Smokeless tobacco: Never Used  Vaping Use  . Vaping Use: Never used  Substance and Sexual Activity  . Alcohol use: Not Currently    Alcohol/week: 0.0 standard drinks    Comment: Quit in 2017  . Drug use: Not Currently    Frequency: 3.0 times per week    Types: Marijuana, Cocaine    Comment: none since August 2018  . Sexual activity: Not on file  Other Topics Concern  . Not on file  Social History Narrative   Right handed   Drinks 1-2 cups caffeine daily   Social Determinants of Health   Financial Resource Strain: Medium Risk  . Difficulty of Paying Living Expenses: Somewhat hard  Food Insecurity: Food Insecurity Present  . Worried About Charity fundraiser in the Last Year: Sometimes true  . Ran Out of Food in the Last Year: Sometimes true  Transportation Needs: No Transportation Needs  . Lack of Transportation (Medical): No  . Lack of Transportation (Non-Medical): No  Physical Activity: Insufficiently Active  . Days of Exercise per Week: 7 days  . Minutes of Exercise per Session: 20 min  Stress: Stress Concern Present  . Feeling of Stress : To some extent  Social Connections: Moderately Isolated  . Frequency of Communication with Friends and Family: More than three times a week  . Frequency of Social Gatherings with Friends and Family: More than three times a week  . Attends Religious Services: More than 4 times per year  . Active Member of Clubs or Organizations: No  . Attends Archivist Meetings: Never  . Marital Status: Widowed  Intimate Partner Violence: Not At Risk  . Fear of Current or Ex-Partner: No  . Emotionally Abused: No  . Physically Abused: No  . Sexually Abused: No   PHYSICAL EXAM  Vitals:    01/30/21 0950  BP: 134/74  Pulse: 74   There is no height or weight on file to calculate BMI.  Generalized: Well developed, in no acute distress   Neurological examination  Mentation: Alert oriented to time, place, history taking. Follows all commands speech and language fluent Cranial nerve II-XII: Pupils were equal round reactive to light. Extraocular movements were full, visual field were full on confrontational test. Facial sensation and strength were normal. Head turning and shoulder shrug were normal and symmetric. Motor: Good strength of right and left upper and lower extremities noted, no notable weakness on the left side with exception of mild left grip weakness Sensory: Sensory testing is intact to soft touch on all 4 extremities. No evidence of extinction is noted.  Coordination: Cerebellar testing reveals good finger-nose-finger and heel-to-shin bilaterally.  Gait and station: in a wheelchair, nonambulatory Reflexes: Deep tendon reflexes are symmetric but are depressed   DIAGNOSTIC DATA (LABS, IMAGING, TESTING) - I reviewed patient records, labs, notes, testing and imaging myself where available.  Lab Results  Component Value Date   WBC 7.6 01/16/2021   HGB 11.0 (L) 01/16/2021   HCT 36.7 01/16/2021   MCV 97.6 01/16/2021   PLT 232 01/16/2021      Component Value Date/Time   NA 143 01/16/2021 1055   K 4.5 01/16/2021 1055   CL 107  01/16/2021 1055   CO2 26 01/16/2021 1055   GLUCOSE 198 (H) 01/16/2021 1055   BUN 36 (H) 01/16/2021 1055   CREATININE 1.30 (H) 01/16/2021 1055   CREATININE 1.56 (H) 07/26/2018 1513   CALCIUM 9.0 01/16/2021 1055   CALCIUM 8.9 09/13/2014 0703   PROT 6.5 12/07/2020 0645   ALBUMIN 2.2 (L) 12/07/2020 0645   AST 20 12/07/2020 0645   ALT 16 12/07/2020 0645   ALKPHOS 51 12/07/2020 0645   BILITOT 0.6 12/07/2020 0645   GFRNONAA 48 (L) 01/16/2021 1055   GFRNONAA 37 (L) 07/26/2018 1513   GFRAA 54 (L) 05/09/2019 0956   GFRAA 43 (L) 07/26/2018  1513   Lab Results  Component Value Date   CHOL 240 (H) 08/23/2020   HDL 34 (L) 08/23/2020   LDLCALC 180 (H) 08/23/2020   LDLDIRECT 122 (H) 01/08/2010   TRIG 261 (H) 12/03/2020   CHOLHDL 7.1 08/23/2020   Lab Results  Component Value Date   HGBA1C 11.1 (H) 12/03/2020   Lab Results  Component Value Date   VITAMINB12 526 07/11/2018   Lab Results  Component Value Date   TSH 0.371 10/31/2020      ASSESSMENT AND PLAN 55 y.o. year old female  has a past medical history of Alcohol use, Ankle fracture, lateral malleolus, closed (2013), Anxiety, Breast mass, left (2013), Chronic anemia, CKD (chronic kidney disease), stage III ([place]), Cocaine abuse ([place]), COPD (chronic obstructive pulmonary disease) ([place]), Diabetes mellitus, type 2 ([place]), Diabetic Charcot foot ([place]), Essential hypertension, History of cardiomyopathy, History of GI bleed (2011), Hyperlipidemia, Noncompliance, Obesity, Panic attacks, PAT (paroxysmal atrial tachycardia) ([place]), Seizures ([place]) (10/01/2020), Sleep apnea, Stroke ([place]) (2018), Tobacco abuse, and Urinary incontinence. here with:  1.  Symptomatic seizures in setting of diabetic ketoacidosis  2.  History of cocaine abuse and tobacco abuse  3.  Cerebrovascular disease, recent right brain stroke, left hemiparesis  4.  Gait disturbance  5.  Diabetes with poor compliance with diet  -No seizure episodes since October 2021 -Check EEG, only seizure event occurred during significant DKA, glucose in the 700s -We may be able to titrate off Keppra -She is still not managing her diabetes well, needs to get better control of this, she still receives a regular diet at her long-term care facility, she is looking to make a change -Will go ahead and schedule a 29-month follow-up, but if the EEG is unremarkable and we do discontinue Keppra, we will follow-up at our office on an as-needed basis  I spent 30 minutes of face-to-face and non-face-to-face time with patient.  This  included previsit chart review, lab review, study review, order entry, electronic health record documentation, patient education.  Butler Denmark, AGNP-C, DNP 01/30/2021, 10:16 AM [facility] [address] [place], [place] [address] [phone]

## 2021-01-30 ENCOUNTER — Ambulatory Visit (INDEPENDENT_AMBULATORY_CARE_PROVIDER_SITE_OTHER): Payer: Medicare Other | Admitting: Neurology

## 2021-01-30 ENCOUNTER — Encounter: Payer: Self-pay | Admitting: Neurology

## 2021-01-30 VITALS — BP 134/74 | HR 74

## 2021-01-30 DIAGNOSIS — R569 Unspecified convulsions: Secondary | ICD-10-CM

## 2021-01-30 MED ORDER — LEVETIRACETAM 750 MG PO TABS: 750.0000 mg | ORAL_TABLET | Freq: Two times a day (BID) | ORAL | 5 refills | Status: DC

## 2021-01-30 NOTE — Patient Instructions (Signed)
Check EEG  Continue Keppra for now May discontinue depending on EEG results  Need to get better control of Diabetes, low carb diet  See you back in 6 months

## 2021-01-30 NOTE — Progress Notes (Signed)
I have read the note, and I agree with the clinical assessment and plan.  Anner Baity K Halia Franey   

## 2021-02-05 ENCOUNTER — Other Ambulatory Visit: Payer: Self-pay

## 2021-02-05 ENCOUNTER — Ambulatory Visit (HOSPITAL_COMMUNITY)
Admission: RE | Admit: 2021-02-05 | Discharge: 2021-02-05 | Disposition: A | Discharge status: Home / Self Care | Payer: Medicare Other | Source: Ambulatory Visit | Attending: Internal Medicine | Admitting: Internal Medicine

## 2021-02-05 DIAGNOSIS — I10 Essential (primary) hypertension: Secondary | ICD-10-CM

## 2021-02-05 LAB — ECHOCARDIOGRAM COMPLETE
Area-P 1/2: 3.6 cm2
S' Lateral: 2.7 cm

## 2021-02-05 NOTE — Progress Notes (Signed)
*  PRELIMINARY RESULTS* Echocardiogram 2D Echocardiogram has been performed.  Samuel Germany 02/05/2021, 3:50 PM

## 2021-02-06 DIAGNOSIS — Z79899 Other long term (current) drug therapy: Secondary | ICD-10-CM | POA: Diagnosis not present

## 2021-02-06 DIAGNOSIS — I639 Cerebral infarction, unspecified: Secondary | ICD-10-CM | POA: Diagnosis not present

## 2021-02-06 DIAGNOSIS — G629 Polyneuropathy, unspecified: Secondary | ICD-10-CM | POA: Diagnosis not present

## 2021-02-06 DIAGNOSIS — I69054 Hemiplegia and hemiparesis following nontraumatic subarachnoid hemorrhage affecting left non-dominant side: Secondary | ICD-10-CM | POA: Diagnosis not present

## 2021-02-10 DIAGNOSIS — E119 Type 2 diabetes mellitus without complications: Secondary | ICD-10-CM | POA: Diagnosis not present

## 2021-02-10 DIAGNOSIS — I10 Essential (primary) hypertension: Secondary | ICD-10-CM | POA: Diagnosis not present

## 2021-02-10 DIAGNOSIS — Z79899 Other long term (current) drug therapy: Secondary | ICD-10-CM | POA: Diagnosis not present

## 2021-02-10 DIAGNOSIS — Z9114 Patient's other noncompliance with medication regimen: Secondary | ICD-10-CM | POA: Diagnosis not present

## 2021-02-12 DIAGNOSIS — T8130XD Disruption of wound, unspecified, subsequent encounter: Secondary | ICD-10-CM | POA: Diagnosis not present

## 2021-02-12 DIAGNOSIS — E119 Type 2 diabetes mellitus without complications: Secondary | ICD-10-CM | POA: Diagnosis not present

## 2021-02-12 DIAGNOSIS — I130 Hypertensive heart and chronic kidney disease with heart failure and stage 1 through stage 4 chronic kidney disease, or unspecified chronic kidney disease: Secondary | ICD-10-CM | POA: Diagnosis not present

## 2021-02-15 DIAGNOSIS — M6281 Muscle weakness (generalized): Secondary | ICD-10-CM | POA: Diagnosis not present

## 2021-02-15 DIAGNOSIS — R269 Unspecified abnormalities of gait and mobility: Secondary | ICD-10-CM | POA: Diagnosis not present

## 2021-02-15 DIAGNOSIS — I639 Cerebral infarction, unspecified: Secondary | ICD-10-CM | POA: Diagnosis not present

## 2021-02-15 DIAGNOSIS — J441 Chronic obstructive pulmonary disease with (acute) exacerbation: Secondary | ICD-10-CM | POA: Diagnosis not present

## 2021-02-19 NOTE — Progress Notes (Signed)
Cardiology Office Note    Date:  02/20/2021   ID:  [name], DOB 13-Jun-1966, MRN [id number]  PCP:  Caprice Renshaw, MD  Cardiologist: Dorris Carnes, MD    Chief Complaint  Patient presents with  . Follow-up    6 week visit    History of Present Illness:    [name] is a 55 y.o. female with past medical history of paroxysmal atrial fibrillation/atrial tachycardia, prior NICM (EF 20-25% by echo in 05/2016 with cath showing normal cors, EF 50-55% in 07/2020), Stage 3 CKD, HTN, HLD, Type 2 DM, tobacco use, history of substance abuse, prior CVA and history of GIB who presents to the office today for 6-week follow-up.  She was last examined by Dr. Harrington Challenger in 12/2020 following a recent admission for COVID-19 pneumonia the prior month. She reported overall doing well at the time of her visit and had experienced some sinus congestion but denied any chest pain or palpitations. Follow-up labs along with a repeat echocardiogram were recommended for further evaluation. Her BNP was elevated at 352 and creatinine was stable at 1.30. Repeat echocardiogram showed a preserved EF of 55 to 60% with no regional wall motion abnormalities. She did have moderate LVH, normal RV function, trivial MR and a trivial pericardial effusion. Lasix was increased to 40 mg daily alternating with 20 mg daily with repeat BMET and BNP recommended in 10 days.   The patient presents to her visit today accompanied by a staff member from [facility] and her sister joined later during the visit and family members joined by phone as well. She has experienced significant lower extremity edema along with worsening abdominal distention and facial swelling over the past month. By review of records, her weight has increased by almost 20 pounds since her visit in 12/2020. Has only been taking Lasix 20mg  once daily. She is not active at baseline and denies any specific changes in her breathing. No recent orthopnea or PND. No recent chest  pain or palpitations. She has developed a wound along her left leg and this is being followed by the wound care nurse at her facility.  Patient reports she does consume sugary and salty snacks regularly and family members confirm this. She is also supposed to be on a fluid restriction but it sounds like she does not follow this routinely. She does try to elevate her legs when in bed but only has 2 small pillows which makes this difficult.   Past Medical History:  Diagnosis Date  . Alcohol use   . Ankle fracture, lateral malleolus, closed 2013  . Anxiety   . Breast mass, left 2013  . CHF (congestive heart failure) ([place])    a. EF 20-25% by echo in 05/2016 with cath showing normal cors b. EF 50-55% in 07/2020 c. 01/2021: EF at 55-60% with moderate LVH  . Chronic anemia   . CKD (chronic kidney disease), stage III ([place])   . Cocaine abuse ([place])   . COPD (chronic obstructive pulmonary disease) ([place])   . Diabetes mellitus, type 2 ([place])   . Diabetic Charcot foot ([place])   . Essential hypertension   . History of cardiomyopathy   . History of GI bleed 2011  . Hyperlipidemia   . Noncompliance   . Obesity   . PAF (paroxysmal atrial fibrillation) ([place])   . Panic attacks   . PAT (paroxysmal atrial tachycardia) ([facility])    Previously on Amiodarone  . Seizures ([place]) 10/01/2020  . Sleep apnea  Not on CPAP  . Stroke ([place]) 2018  . Tobacco abuse   . Urinary incontinence     Past Surgical History:  Procedure Laterality Date  . BIOPSY  11/12/2020   Procedure: BIOPSY;  Surgeon: Harvel Quale, MD;  [facility];  Service: Gastroenterology;;  . BREAST BIOPSY    . CARDIAC CATHETERIZATION N/A 07/28/2016   Procedure: Left Heart Cath and Coronary Angiography;  Surgeon: Jettie Booze, MD;  Location: [facility];  Service: Cardiovascular;  Laterality: N/A;  . COLONOSCOPY N/A 05/10/2019   Procedure: COLONOSCOPY;  Surgeon: Danie Binder, MD;  [facility];   Service: Endoscopy;  Laterality: N/A;  Phenergan 12.5 mg IV in pre-op  . DILATION AND CURETTAGE OF UTERUS    . ESOPHAGOGASTRODUODENOSCOPY (EGD) WITH PROPOFOL N/A 11/12/2020   Procedure: ESOPHAGOGASTRODUODENOSCOPY (EGD) WITH PROPOFOL;  Surgeon: Harvel Quale, MD;  [facility];  Service: Gastroenterology;  Laterality: N/A;  . I & D EXTREMITY Bilateral 09/22/2017   Procedure: BILATERAL DEBRIDEMENT LEG/FOOT ULCERS, APPLY VERAFLO WOUND VAC;  Surgeon: Newt Minion, MD;  Location: [place];  Service: Orthopedics;  Laterality: Bilateral;  . I & D EXTREMITY Right 10/11/2018   Procedure: IRRIGATION AND DEBRIDEMENT RIGHT HAND;  Surgeon: [name], MD;  Location: [place];  Service: Orthopedics;  Laterality: Right;  . I & D EXTREMITY Right 10/13/2018   Procedure: REPEAT IRRIGATION AND DEBRIDEMENT RIGHT HAND;  Surgeon: [name], MD;  Location: [place];  Service: Orthopedics;  Laterality: Right;  . I & D EXTREMITY Right 11/22/2018   Procedure: IRRIGATION AND DEBRIDEMENT AND PINNING RIGHT HAND;  Surgeon: [name], MD;  Location: [place];  Service: Orthopedics;  Laterality: Right;  . IR RADIOLOGIST EVAL & MGMT  07/05/2018  . POLYPECTOMY  05/10/2019   Procedure: POLYPECTOMY;  Surgeon: Danie Binder, MD;  [facility];  Service: Endoscopy;;  . SKIN SPLIT GRAFT Bilateral 09/28/2017   Procedure: BILATERAL SPLIT THICKNESS SKIN GRAFT LEGS/FEET AND APPLY VAC;  Surgeon: Newt Minion, MD;  Location: [place];  Service: Orthopedics;  Laterality: Bilateral;  . SKIN SPLIT GRAFT Right 11/22/2018   Procedure: SKIN GRAFT SPLIT THICKNESS;  Surgeon: [name], MD;  Location: [place];  Service: Orthopedics;  Laterality: Right;    Current Medications: Outpatient Medications Prior to Visit  Medication Sig Dispense Refill  . acetaminophen (TYLENOL) 325 MG tablet Take 2 tablets (650 mg total) by mouth every 6 (six) hours as needed for headache, fever or mild pain.    Marland Kitchen albuterol  (PROAIR HFA) 108 (90 Base) MCG/ACT inhaler INHALE 2 PUFFS EVERY 6 HOURS AS NEEDED FOR SHORTNESS OF BREATH/WHEEZING. (Patient taking differently: Inhale 2 puffs into the lungs every 6 (six) hours as needed for wheezing or shortness of breath.) 8.5 g 2  . amLODipine (NORVASC) 10 MG tablet Take 1 tablet (10 mg total) by mouth daily. 30 tablet 5  . apixaban (ELIQUIS) 5 MG TABS tablet Take 1 tablet (5 mg total) by mouth 2 (two) times daily. Okay to restart Eliquis on 11/17/2020 60 tablet 3  . Calcium Carbonate Antacid (TUMS PO) Take 2 tablets by mouth every 8 (eight) hours as needed.    . DULoxetine (CYMBALTA) 60 MG capsule Take 1 capsule (60 mg total) by mouth daily. 30 capsule 3  . gabapentin (NEURONTIN) 300 MG capsule TAKE 1 CAPSULE BY MOUTH THREE TIMES A DAY. (Patient taking differently: Take 300 mg by mouth 3 (three) times daily.) 90 capsule  0  . guaiFENesin (MUCINEX) 600 MG 12 hr tablet Take 600 mg by mouth 2 (two) times daily.    . insulin glargine (LANTUS) 100 UNIT/ML injection Inject 0.14 mLs (14 Units total) into the skin at bedtime. 10 mL 11  . insulin lispro (HUMALOG) 100 UNIT/ML injection Inject into the skin 3 (three) times daily before meals. 151-200= 1 unit, 201-250= 2 units, 251-300= 4 units, 301-350= 6 units, 351-400=8 units, 401-500= 10  units    . levETIRAcetam (KEPPRA) 750 MG tablet Take 1 tablet (750 mg total) by mouth 2 (two) times daily. 60 tablet 5  . linagliptin (TRADJENTA) 5 MG TABS tablet Take 5 mg by mouth daily.    Marland Kitchen oxybutynin (DITROPAN-XL) 5 MG 24 hr tablet Take 5 mg by mouth at bedtime.    . predniSONE (DELTASONE) 50 MG tablet Take 1 tablet (50 mg total) by mouth daily with breakfast. 3 tablet 0  . rosuvastatin (CRESTOR) 10 MG tablet Take 1 tablet (10 mg total) by mouth daily. 30 tablet 0  . sitaGLIPtin (JANUVIA) 100 MG tablet Take 100 mg by mouth daily.    Marland Kitchen tiotropium (SPIRIVA HANDIHALER) 18 MCG inhalation capsule Place 18 mcg into inhaler and inhale daily as needed  (Shortness of breath).    . traZODone (DESYREL) 50 MG tablet Take 1 tablet (50 mg total) by mouth at bedtime. 30 tablet 3  . vitamin C (ASCORBIC ACID) 500 MG tablet Take 500 mg by mouth daily.    Marland Kitchen VITAMIN D, CHOLECALCIFEROL, PO Take 5,000 Units by mouth daily.    Marland Kitchen zinc sulfate 220 (50 Zn) MG capsule Take 1 capsule (220 mg total) by mouth daily. 30 capsule 2  . furosemide (LASIX) 20 MG tablet Take 1 tablet (20 mg total) by mouth daily. For fluid 30 tablet 2  . LORazepam (ATIVAN) 0.5 MG tablet Take 0.5 mg by mouth every 8 (eight) hours as needed for anxiety.    . metoprolol succinate (TOPROL-XL) 100 MG 24 hr tablet Take 1 tablet (100 mg total) by mouth 2 (two) times daily. Take with or immediately following a meal. 60 tablet 3  . montelukast (SINGULAIR) 10 MG tablet TAKE (1) TABLET BY MOUTH AT BEDTIME. (Patient taking differently: Take 10 mg by mouth at bedtime.) 30 tablet 0  . pantoprazole (PROTONIX) 40 MG tablet Take 1 tablet (40 mg total) by mouth 2 (two) times daily before a meal. 180 tablet 1  . polyethylene glycol (MIRALAX) 17 g packet Take 17 g by mouth daily as needed. 30 each 5   No facility-administered medications prior to visit.     Allergies:   Patient has no known allergies.   Social History   Socioeconomic History  . Marital status: Widowed    Spouse name: Not on file  . Number of children: 2  . Years of education: 9  . Highest education level: 9th grade  Occupational History  . Occupation: Disabled  Tobacco Use  . Smoking status: Current Some Day Smoker    Packs/day: 0.25    Years: 20.00    Pack years: 5.00    Types: Cigarettes    Last attempt to quit: 06/03/2017    Years since quitting: 3.7  . Smokeless tobacco: Never Used  Vaping Use  . Vaping Use: Never used  Substance and Sexual Activity  . Alcohol use: Not Currently    Alcohol/week: 0.0 standard drinks    Comment: Quit in 2017  . Drug use: Not Currently    Frequency: 3.0  times per week    Types:  Marijuana, Cocaine    Comment: none since August 2018  . Sexual activity: Not on file  Other Topics Concern  . Not on file  Social History Narrative   Right handed   Drinks 1-2 cups caffeine daily   Social Determinants of Health   Financial Resource Strain: Medium Risk  . Difficulty of Paying Living Expenses: Somewhat hard  Food Insecurity: Food Insecurity Present  . Worried About Charity fundraiser in the Last Year: Sometimes true  . Ran Out of Food in the Last Year: Sometimes true  Transportation Needs: No Transportation Needs  . Lack of Transportation (Medical): No  . Lack of Transportation (Non-Medical): No  Physical Activity: Insufficiently Active  . Days of Exercise per Week: 7 days  . Minutes of Exercise per Session: 20 min  Stress: Stress Concern Present  . Feeling of Stress : To some extent  Social Connections: Moderately Isolated  . Frequency of Communication with Friends and Family: More than three times a week  . Frequency of Social Gatherings with Friends and Family: More than three times a week  . Attends Religious Services: More than 4 times per year  . Active Member of Clubs or Organizations: No  . Attends Archivist Meetings: Never  . Marital Status: Widowed     Family History:  The patient's family history includes Arthritis in an other family member; Asthma in an other family member; Cancer in her sister and another family member; Diabetes in an other family member; Heart attack in her mother; Hypertension in her brother, daughter, father, mother, sister, sister, and son.   Review of Systems:    Please see the history of present illness.     All other systems reviewed and are otherwise negative except as noted above.   Physical Exam:    VS:  BP (!) 148/100   Pulse 90   Ht 5\' 1"  (1.549 m)   Wt 213 lb (96.6 kg)   LMP 05/19/2016   SpO2 92%   BMI 40.25 kg/m    General: Well developed, well nourished,female appearing in no acute distress.  Sitting in wheelchair.  Head: Normocephalic, atraumatic. Facial edema.  Neck: No carotid bruits. JVD not elevated.  Lungs: Respirations regular and unlabored, without wheezes or rales.  Heart: Regular rate and rhythm. No S3 or S4.  No murmur, no rubs, or gallops appreciated. Abdomen: Appears non-distended. No obvious abdominal masses. Msk:  Strength and tone appear normal for age. No obvious joint deformities or effusions. Extremities: No clubbing or cyanosis. 2+ pitting edema up to knees bilaterally. Dressing in place along left leg.  Neuro: Alert and oriented X 3. Moves all extremities spontaneously. No focal deficits noted. Psych:  Responds to questions appropriately with a normal affect. Skin: No rashes or lesions noted  Wt Readings from Last 3 Encounters:  02/20/21 213 lb (96.6 kg)  01/16/21 194 lb (88 kg)  01/01/21 161 lb (73 kg)     Studies/Labs Reviewed:   EKG:  EKG is not ordered today.   Recent Labs: 10/31/2020: TSH 0.371 12/07/2020: ALT 16; Magnesium 1.9 01/16/2021: B Natriuretic Peptide 352.0; BUN 36; Creatinine, Ser 1.30; Hemoglobin 11.0; Platelets 232; Potassium 4.5; Sodium 143   Lipid Panel    Component Value Date/Time   CHOL 240 (H) 08/23/2020 0248   TRIG 261 (H) 12/03/2020 1432   HDL 34 (L) 08/23/2020 0248   CHOLHDL 7.1 08/23/2020 0248   VLDL 26 08/23/2020 0248  [place] 180 (H) 08/23/2020 0248   LDLCALC 73 12/07/2018 1543   LDLDIRECT 122 (H) 01/08/2010 0818    Additional studies/ records that were reviewed today include:   Cardiac Catheterization: [id number]  LV end diastolic pressure is mildly elevated.  There is no aortic valve stenosis.  No angiographically apparent coronary artery disease.  Tortuous right subclavian requiring Glide wire. Would not use right radial approach if cath was needed in the future.   Continue aggressive medical therapy and blood pressure control.   Event Monitor: 08/2017  Sinus rhythm. No arrhythmias. No symptoms  reported.  Echocardiogram: 01/2019 IMPRESSIONS    1. Left ventricular ejection fraction, by estimation, is 55 to 60%. The  left ventricle has normal function. The left ventricle has no regional  wall motion abnormalities. There is moderate left ventricular hypertrophy.  Left ventricular diastolic  parameters were normal.  2. Right ventricular systolic function is normal. The right ventricular  size is normal. There is normal pulmonary artery systolic pressure.  3. Left atrial size was moderately dilated.  4. The pericardial effusion is lateral to the left ventricle.  5. The mitral valve is normal in structure. Trivial mitral valve  regurgitation. No evidence of mitral stenosis.  6. The aortic valve is normal in structure. Aortic valve regurgitation is  not visualized. No aortic stenosis is present.  7. The inferior vena cava is normal in size with greater than 50%  respiratory variability, suggesting right atrial pressure of 3 mmHg.   Assessment:    1. Acute on chronic diastolic heart failure ([place])   2. Medication management   3. PAF (paroxysmal atrial fibrillation) ([place])   4. History of cardiomyopathy   5. Essential hypertension   6. Hyperlipidemia LDL goal <70   7. Stage 3b chronic kidney disease ([place])      Plan:   In order of problems listed above:  1. Acute on Chronic Diastolic CHF Exacerbation - Presents with a 20 lb weight gain within the past month with worsening facial swelling, abdominal distension and lower extremity edema. She does not follow a fluid restriction and consumes snacks high in salt.  - She is currently on Lasix 20mg  daily. Will titrate to 40mg  BID for now given her significant volume overload. Also start K-dur 20 mEq daily. Repeat BMET and BNP in 1 week. I have asked for her SNF to follow daily weights (currently weighed once a month), limit fluid to 1500 mL daily and sodium intake to less than 2000 mg daily. Will bring back to the office in 2-3  weeks for reassessment. If her volume status has not improved, she may require admission and this was reviewed with the patient and her family today.  - Her family expressed concerns about the care she is receiving at SNF and I encouraged them to bring their concerns to administration at SNF. They are in the process of trying to find a new facility.   2. Paroxysmal Atrial Fibrillation - She denies any recent palpitations. Continue Toprol-XL 100mg  BID for rate-control.  - No reports of active bleeding. Hgb was stable at 11.0 and platelets at 232 by recent labs in 12/2020. Remains on Eliquis 5mg  BID which is the appropriate dose at this time given her age, weight and renal function.   3. History of NICM - Her EF was at 20-25% by echo in 05/2016 with cath showing normal cors and EF improved to 50-55% in 07/2020. Recent repeat echo showed a preserved EF of 55-60% as outlined above.  -  She remains on Toprol-XL 100mg  BID. No longer on ACE-I or ARB given her variable renal function.   4. HTN - BP is elevated at 148/100 during today's visit. Would follow with diuresis as I suspect this will improve with volume loss. Continue Amlodipine 10mg  daily and Toprol-XL 100mg  BID.   5. HLD - Followed by PCP. She remains on Crestor 10mg  daily with goal LDL less than 70 in the setting of prior CVA.   6. Stage 3 CKD - Baseline creatinine 1.3 - 1.4. Stable at 1.30 on 01/16/2021 but peaked at 2.47 in 10/2020. Repeat BMET in 1 week.    Medication Adjustments/Labs and Tests Ordered: Current medicines are reviewed at length with the patient today.  Concerns regarding medicines are outlined above.  Medication changes, Labs and Tests ordered today are listed in the Patient Instructions below. Patient Instructions   Medication Instructions:  Your physician has recommended you make the following change in your medication:   Increase Lasix to 40 mg Two Times Daily  Start Potassium 20 meq Daily   *If you need a refill  on your cardiac medications before your next appointment, please call your pharmacy*   Lab Work: Your physician recommends that you return for lab work in: 1 Week.   If you have labs (blood work) drawn today and your tests are completely normal, you will receive your results only by: Marland Kitchen MyChart Message (if you have MyChart) OR . A paper copy in the mail If you have any lab test that is abnormal or we need to change your treatment, we will call you to review the results.   Testing/Procedures: NONE    Follow-Up: At [facility], you and your health needs are our priority.  As part of our continuing mission to provide you with exceptional heart care, we have created designated Provider Care Teams.  These Care Teams include your primary Cardiologist (physician) and Advanced Practice Providers (APPs -  Physician Assistants and Nurse Practitioners) who all work together to provide you with the care you need, when you need it.  We recommend signing up for the patient portal called "MyChart".  Sign up information is provided on this After Visit Summary.  MyChart is used to connect with patients for Virtual Visits (Telemedicine).  Patients are able to view lab/test results, encounter notes, upcoming appointments, etc.  Non-urgent messages can be sent to your provider as well.   To learn more about what you can do with MyChart, go to [email or web address].    Your next appointment:   2 week(s)  The format for your next appointment:   In Person  Provider:   You may see Dorris Carnes, MD or one of the following Advanced Practice Providers on your designated Care Team:    Bernerd Pho, PA-C   Ermalinda Barrios, PA-C     Other Instructions Thank you for choosing [facility]!  Two Gram Sodium Diet 2000 mg  What is Sodium? Sodium is a mineral found naturally in many foods. The most significant source of sodium in the diet is table salt, which is about 40% sodium.  Processed,  convenience, and preserved foods also contain a large amount of sodium.  The body needs only 500 mg of sodium daily to function,  A normal diet provides more than enough sodium even if you do not use salt.  Why Limit Sodium? A build up of sodium in the body can cause thirst, increased blood pressure, shortness of breath, and water retention.  Decreasing sodium in the diet can reduce edema and risk of heart attack or stroke associated with high blood pressure.  Keep in mind that there are many other factors involved in these health problems.  Heredity, obesity, lack of exercise, cigarette smoking, stress and what you eat all play a role.  General Guidelines:  Do not add salt at the table or in cooking.  One teaspoon of salt contains over 2 grams of sodium.  Read food labels  Avoid processed and convenience foods  Ask your dietitian before eating any foods not dicussed in the menu planning guidelines  Consult your physician if you wish to use a salt substitute or a sodium containing medication such as antacids.  Limit milk and milk products to 16 oz (2 cups) per day.  Shopping Hints:  READ LABELS!! "Dietetic" does not necessarily mean low sodium.  Salt and other sodium ingredients are often added to foods during processing.   Menu Planning Guidelines Food Group Choose More Often Avoid  Beverages (see also the milk group All fruit juices, low-sodium, salt-free vegetables juices, low-sodium carbonated beverages Regular vegetable or tomato juices, commercially softened water used for drinking or cooking  Breads and Cereals Enriched white, wheat, rye and pumpernickel bread, hard rolls and dinner rolls; muffins, cornbread and waffles; most dry cereals, cooked cereal without added salt; unsalted crackers and breadsticks; low sodium or homemade bread crumbs Bread, rolls and crackers with salted tops; quick breads; instant hot cereals; pancakes; commercial bread stuffing; self-rising flower and  biscuit mixes; regular bread crumbs or cracker crumbs  Desserts and Sweets Desserts and sweets mad with mild should be within allowance Instant pudding mixes and cake mixes  Fats Butter or margarine; vegetable oils; unsalted salad dressings, regular salad dressings limited to 1 Tbs; light, sour and heavy cream Regular salad dressings containing bacon fat, bacon bits, and salt pork; snack dips made with instant soup mixes or processed cheese; salted nuts  Fruits Most fresh, frozen and canned fruits Fruits processed with salt or sodium-containing ingredient (some dried fruits are processed with sodium sulfites        Vegetables Fresh, frozen vegetables and low- sodium canned vegetables Regular canned vegetables, sauerkraut, pickled vegetables, and others prepared in brine; frozen vegetables in sauces; vegetables seasoned with ham, bacon or salt pork  Condiments, Sauces, Miscellaneous  Salt substitute with physician's approval; pepper, herbs, spices; vinegar, lemon or lime juice; hot pepper sauce; garlic powder, onion powder, low sodium soy sauce (1 Tbs.); low sodium condiments (ketchup, chili sauce, mustard) in limited amounts (1 tsp.) fresh ground horseradish; unsalted tortilla chips, pretzels, potato chips, popcorn, salsa (1/4 cup) Any seasoning made with salt including garlic salt, celery salt, onion salt, and seasoned salt; sea salt, rock salt, kosher salt; meat tenderizers; monosodium glutamate; mustard, regular soy sauce, barbecue, sauce, chili sauce, teriyaki sauce, steak sauce, Worcestershire sauce, and most flavored vinegars; canned gravy and mixes; regular condiments; salted snack foods, olives, picles, relish, horseradish sauce, catsup   Food preparation: Try these seasonings Meats:    Pork Sage, onion Serve with applesauce  Chicken Poultry seasoning, thyme, parsley Serve with cranberry sauce  Lamb Curry powder, rosemary, garlic, thyme Serve with mint sauce or jelly  Veal Marjoram, basil  Serve with current jelly, cranberry sauce  Beef Pepper, bay leaf Serve with dry mustard, unsalted chive butter  Fish [place] leaf, dill Serve with unsalted lemon butter, unsalted parsley butter  Vegetables:    Asparagus Lemon juice   Broccoli Lemon juice  Carrots Mustard dressing parsley, mint, nutmeg, glazed with unsalted butter and sugar   Green beans Marjoram, lemon juice, nutmeg,dill seed   Tomatoes Basil, marjoram, onion   Spice /blend for [employer]" 4 tsp ground thyme 1 tsp ground sage 3 tsp ground rosemary 4 tsp ground marjoram   Test your knowledge 1. A product that says "Salt Free" may still contain sodium. True or False 2. Garlic Powder and Hot Pepper Sauce an be used as alternative seasonings.True or False 3. Processed foods have more sodium than fresh foods.  True or False 4. Canned Vegetables have less sodium than froze True or False  WAYS TO DECREASE YOUR SODIUM INTAKE 1. Avoid the use of added salt in cooking and at the table.  Table salt (and other prepared seasonings which contain salt) is probably one of the greatest sources of sodium in the diet.  Unsalted foods can gain flavor from the sweet, sour, and butter taste sensations of herbs and spices.  Instead of using salt for seasoning, try the following seasonings with the foods listed.  Remember: how you use them to enhance natural food flavors is limited only by your creativity... Allspice-Meat, fish, eggs, fruit, peas, red and yellow vegetables Almond Extract-Fruit baked goods Anise Seed-Sweet breads, fruit, carrots, beets, cottage cheese, cookies (tastes like licorice) Basil-Meat, fish, eggs, vegetables, rice, vegetables salads, soups, sauces Bay Leaf-Meat, fish, stews, poultry Burnet-Salad, vegetables (cucumber-like flavor) Caraway Seed-Bread, cookies, cottage cheese, meat, vegetables, cheese, rice Cardamon-Baked goods, fruit, soups Celery Powder or seed-Salads, salad dressings, sauces, meatloaf, soup, bread.Do not  use  celery salt Chervil-Meats, salads, fish, eggs, vegetables, cottage cheese (parsley-like flavor) Chili Power-Meatloaf, chicken cheese, corn, eggplant, egg dishes Chives-Salads cottage cheese, egg dishes, soups, vegetables, sauces Cilantro-Salsa, casseroles Cinnamon-Baked goods, fruit, pork, lamb, chicken, carrots Cloves-Fruit, baked goods, fish, pot roast, green beans, beets, carrots Coriander-Pastry, cookies, meat, salads, cheese (lemon-orange flavor) Cumin-Meatloaf, fish,cheese, eggs, cabbage,fruit pie (caraway flavor) [employer], fruit, eggs, fish, poultry, cottage cheese, vegetables Dill Seed-Meat, cottage cheese, poultry, vegetables, fish, salads, bread Fennel Seed-Bread, cookies, apples, pork, eggs, fish, beets, cabbage, cheese, Licorice-like flavor Garlic-(buds or powder) Salads, meat, poultry, fish, bread, butter, vegetables, potatoes.Do not  use garlic salt Ginger-Fruit, vegetables, baked goods, meat, fish, poultry Horseradish Root-Meet, vegetables, butter Lemon Juice or Extract-Vegetables, fruit, tea, baked goods, fish salads Mace-Baked goods fruit, vegetables, fish, poultry (taste like nutmeg) Maple Extract-Syrups Marjoram-Meat, chicken, fish, vegetables, breads, green salads (taste like Sage) Mint-Tea, lamb, sherbet, vegetables, desserts, carrots, cabbage Mustard, Dry or Seed-Cheese, eggs, meats, vegetables, poultry Nutmeg-Baked goods, fruit, chicken, eggs, vegetables, desserts Onion Powder-Meat, fish, poultry, vegetables, cheese, eggs, bread, rice salads (Do not use   Onion salt) Orange Extract-Desserts, baked goods Oregano-Pasta, eggs, cheese, onions, pork, lamb, fish, chicken, vegetables, green salads Paprika-Meat, fish, poultry, eggs, cheese, vegetables Parsley Flakes-Butter, vegetables, meat fish, poultry, eggs, bread, salads (certain forms may   Contain sodium Pepper-Meat fish, poultry, vegetables, eggs Peppermint Extract-Desserts, baked goods Poppy  Seed-Eggs, bread, cheese, fruit dressings, baked goods, noodles, vegetables, cottage  [employer], poultry, meat, fish, cauliflower, turnips,eggs bread Saffron-Rice, bread, veal, chicken, fish, eggs Sage-Meat, fish, poultry, onions, eggplant, tomateos, pork, stews Savory-Eggs, salads, poultry, meat, rice, vegetables, soups, pork Tarragon-Meat, poultry, fish, eggs, butter, vegetables (licorice-like flavor)  Thyme-Meat, poultry, fish, eggs, vegetables, (clover-like flavor), sauces, soups Tumeric-Salads, butter, eggs, fish, rice, vegetables (saffron-like flavor) Vanilla Extract-Baked goods, candy Vinegar-Salads, vegetables, meat marinades Walnut Extract-baked goods, candy  2. Choose your Foods Wisely   The following is a list of  foods to avoid which are high in sodium:  Meats-Avoid all smoked, canned, salt cured, dried and kosher meat and fish as well as Anchovies   [employer] meats:Bologna, Liverwurst, Pastrami Canned meat or fish  Marinated herring Caviar    Pepperoni Corned Beef   Pizza Dried chipped beef  Salami Frozen breaded fish or meat Salt pork Frankfurters or hot dogs  Sardines Gefilte fish   Sausage Ham (boiled ham, Proscuitto Smoked butt    spiced ham)   Spam      TV Dinners Vegetables Canned vegetables (Regular) Relish Canned mushrooms  Sauerkraut Olives    Tomato juice Pickles  Bakery and Dessert Products Canned puddings  Cream pies Cheesecake   Decorated cakes Cookies  Beverages/Juices Tomato juice, regular  Gatorade   V-8 vegetable juice, regular  Breads and Cereals Biscuit mixes   Salted potato chips, corn chips, pretzels Bread stuffing mixes  Salted crackers and rolls Pancake and waffle mixes Self-rising flour  Seasonings Accent    Meat sauces Barbecue sauce  Meat tenderizer Catsup    Monosodium glutamate (MSG) Celery salt   Onion salt Chili sauce   Prepared mustard Garlic salt   Salt, seasoned  salt, sea salt Gravy mixes   Soy sauce Horseradish   Steak sauce Ketchup   Tartar sauce Lite salt    Teriyaki sauce Marinade mixes   Worcestershire sauce  Others Baking powder   Cocoa and cocoa mixes Baking soda   Commercial casserole mixes Candy-caramels, chocolate  Dehydrated soups    Bars, fudge,nougats  Instant rice and pasta mixes Canned broth or soup  Maraschino cherries Cheese, aged and processed cheese and cheese spreads  Learning Assessment Quiz  Indicated T (for True) or F (for False) for each of the following statements:  1. _____ Fresh fruits and vegetables and unprocessed grains are generally low in sodium 2. _____ Water may contain a considerable amount of sodium, depending on the source 3. _____ You can always tell if a food is high in sodium by tasting it 4. _____ Certain laxatives my be high in sodium and should be avoided unless prescribed   by a physician or pharmacist 5. _____ Salt substitutes may be used freely by anyone on a sodium restricted diet 6. _____ Sodium is present in table salt, food additives and as a natural component of   most foods 7. _____ Table salt is approximately 90% sodium 8. _____ Limiting sodium intake may help prevent excess fluid accumulation in the body 9. _____ On a sodium-restricted diet, seasonings such as bouillon soy sauce, and    cooking wine should be used in place of table salt 10. _____ On an ingredient list, a product which lists monosodium glutamate as the first   ingredient is an appropriate food to include on a low sodium diet  Circle the best answer(s) to the following statements (Hint: there may be more than one correct answer)  11. On a low-sodium diet, some acceptable snack items are:    A. Olives  F. Bean dip   K. Grapefruit juice    B. Salted Pretzels G. Commercial Popcorn   L. Canned peaches    C. Carrot Sticks  H. Bouillon   M. Unsalted nuts   D. [place] fries  I. Peanut butter crackers N. Salami   E. Sweet  pickles J. Tomato Juice   O. Pizza  12.  Seasonings that may be used freely on a reduced - sodium diet include  A. Lemon wedges F.Monosodium glutamate K. Celery seed    B.Soysauce   G. Pepper   L. Mustard powder   C. Sea salt  H. Cooking wine  M. Onion flakes   D. Vinegar  E. Prepared horseradish N. Salsa   E. Sage   J. Worcestershire sauce  O. Chutney  Heart-Healthy Eating Plan Many factors influence your heart (coronary) health, including eating and exercise habits. Coronary risk increases with abnormal blood fat (lipid) levels. Heart-healthy meal planning includes limiting unhealthy fats, increasing healthy fats, and making other diet and lifestyle changes. What is my plan? Your health care provider may recommend that you:  Limit your fat intake to _________% or less of your total calories each day.  Limit your saturated fat intake to _________% or less of your total calories each day.  Limit the amount of cholesterol in your diet to less than _________ mg per day. What are tips for following this plan? Cooking Cook foods using methods other than frying. Baking, boiling, grilling, and broiling are all good options. Other ways to reduce fat include:  Removing the skin from poultry.  Removing all visible fats from meats.  Steaming vegetables in water or broth. Meal planning  At meals, imagine dividing your plate into fourths: ? Fill one-half of your plate with vegetables and green salads. ? Fill one-fourth of your plate with whole grains. ? Fill one-fourth of your plate with lean protein foods.  Eat 4-5 servings of vegetables per day. One serving equals 1 cup raw or cooked vegetable, or 2 cups raw leafy greens.  Eat 4-5 servings of fruit per day. One serving equals 1 medium whole fruit,  cup dried fruit,  cup fresh, frozen, or canned fruit, or  cup 100% fruit juice.  Eat more foods that contain soluble fiber. Examples include apples, broccoli, carrots, beans, peas,  and barley. Aim to get 25-30 g of fiber per day.  Increase your consumption of legumes, nuts, and seeds to 4-5 servings per week. One serving of dried beans or legumes equals  cup cooked, 1 serving of nuts is  cup, and 1 serving of seeds equals 1 tablespoon.   Fats  Choose healthy fats more often. Choose monounsaturated and polyunsaturated fats, such as olive and canola oils, flaxseeds, walnuts, almonds, and seeds.  Eat more omega-3 fats. Choose salmon, mackerel, sardines, tuna, flaxseed oil, and ground flaxseeds. Aim to eat fish at least 2 times each week.  Check food labels carefully to identify foods with trans fats or high amounts of saturated fat.  Limit saturated fats. These are found in animal products, such as meats, butter, and cream. Plant sources of saturated fats include palm oil, palm kernel oil, and coconut oil.  Avoid foods with partially hydrogenated oils in them. These contain trans fats. Examples are stick margarine, some tub margarines, cookies, crackers, and other baked goods.  Avoid fried foods. General information  Eat more home-cooked food and less restaurant, buffet, and fast food.  Limit or avoid alcohol.  Limit foods that are high in starch and sugar.  Lose weight if you are overweight. Losing just 5-10% of your body weight can help your overall health and prevent diseases such as diabetes and heart disease.  Monitor your salt (sodium) intake, especially if you have high blood pressure. Talk with your health care provider about your sodium intake.  Try to incorporate more vegetarian meals weekly. What foods can I eat? Fruits All fresh, canned (in natural juice), or frozen fruits.  Vegetables Fresh or frozen vegetables (raw, steamed, roasted, or grilled). Green salads. Grains Most grains. Choose whole wheat and whole grains most of the time. Rice and pasta, including brown rice and pastas made with whole wheat. Meats and other proteins Lean, well-trimmed  beef, veal, pork, and lamb. Chicken and [place] without skin. All fish and shellfish. Wild duck, rabbit, pheasant, and venison. Egg whites or low-cholesterol egg substitutes. Dried beans, peas, lentils, and tofu. Seeds and most nuts. Dairy Low-fat or nonfat cheeses, including ricotta and mozzarella. Skim or 1% milk (liquid, powdered, or evaporated). Buttermilk made with low-fat milk. Nonfat or low-fat yogurt. Fats and oils Non-hydrogenated (trans-free) margarines. Vegetable oils, including soybean, sesame, sunflower, olive, peanut, safflower, corn, canola, and cottonseed. Salad dressings or mayonnaise made with a vegetable oil. Beverages Water (mineral or sparkling). Coffee and tea. Diet carbonated beverages. Sweets and desserts Sherbet, gelatin, and fruit ice. Small amounts of dark chocolate. Limit all sweets and desserts. Seasonings and condiments All seasonings and condiments. The items listed above may not be a complete list of foods and beverages you can eat. Contact a dietitian for more options. What foods are not recommended? Fruits Canned fruit in heavy syrup. Fruit in cream or butter sauce. Fried fruit. Limit coconut. Vegetables Vegetables cooked in cheese, cream, or butter sauce. Fried vegetables. Grains Breads made with saturated or trans fats, oils, or whole milk. Croissants. Sweet rolls. Donuts. High-fat crackers, such as cheese crackers. Meats and other proteins Fatty meats, such as hot dogs, ribs, sausage, bacon, rib-eye roast or steak. High-fat deli meats, such as salami and bologna. Caviar. Domestic duck and goose. Organ meats, such as liver. Dairy Cream, sour cream, cream cheese, and creamed cottage cheese. Whole milk cheeses. Whole or 2% milk (liquid, evaporated, or condensed). Whole buttermilk. Cream sauce or high-fat cheese sauce. Whole-milk yogurt. Fats and oils Meat fat, or shortening. Cocoa butter, hydrogenated oils, palm oil, coconut oil, palm kernel oil. Solid fats  and shortenings, including bacon fat, salt pork, lard, and butter. Nondairy cream substitutes. Salad dressings with cheese or sour cream. Beverages Regular sodas and any drinks with added sugar. Sweets and desserts Frosting. Pudding. Cookies. Cakes. Pies. Milk chocolate or white chocolate. Buttered syrups. Full-fat ice cream or ice cream drinks. The items listed above may not be a complete list of foods and beverages to avoid. Contact a dietitian for more information. Summary  Heart-healthy meal planning includes limiting unhealthy fats, increasing healthy fats, and making other diet and lifestyle changes.  Lose weight if you are overweight. Losing just 5-10% of your body weight can help your overall health and prevent diseases such as diabetes and heart disease.  Focus on eating a balance of foods, including fruits and vegetables, low-fat or nonfat dairy, lean protein, nuts and legumes, whole grains, and heart-healthy oils and fats. This information is not intended to replace advice given to you by your health care provider. Make sure you discuss any questions you have with your health care provider. Document Revised: 11/19/2017 Document Reviewed: 11/19/2017 [employer] Patient Education  [address]      Signed, Erma Heritage, [place]  02/20/2021 8:11 PM    [facility] [address] [place], [place] [address] Phone: [phone] Fax: [phone]

## 2021-02-20 ENCOUNTER — Ambulatory Visit (INDEPENDENT_AMBULATORY_CARE_PROVIDER_SITE_OTHER): Payer: Medicare Other | Admitting: Student

## 2021-02-20 ENCOUNTER — Other Ambulatory Visit: Payer: Self-pay

## 2021-02-20 ENCOUNTER — Encounter: Payer: Self-pay | Admitting: Student

## 2021-02-20 VITALS — BP 148/100 | HR 90 | Ht 61.0 in | Wt 213.0 lb

## 2021-02-20 DIAGNOSIS — N1832 Chronic kidney disease, stage 3b: Secondary | ICD-10-CM

## 2021-02-20 DIAGNOSIS — I5030 Unspecified diastolic (congestive) heart failure: Secondary | ICD-10-CM | POA: Diagnosis not present

## 2021-02-20 DIAGNOSIS — I130 Hypertensive heart and chronic kidney disease with heart failure and stage 1 through stage 4 chronic kidney disease, or unspecified chronic kidney disease: Secondary | ICD-10-CM | POA: Diagnosis not present

## 2021-02-20 DIAGNOSIS — Z8679 Personal history of other diseases of the circulatory system: Secondary | ICD-10-CM | POA: Diagnosis not present

## 2021-02-20 DIAGNOSIS — I480 Paroxysmal atrial fibrillation: Secondary | ICD-10-CM | POA: Diagnosis not present

## 2021-02-20 DIAGNOSIS — Z79899 Other long term (current) drug therapy: Secondary | ICD-10-CM

## 2021-02-20 DIAGNOSIS — E785 Hyperlipidemia, unspecified: Secondary | ICD-10-CM

## 2021-02-20 DIAGNOSIS — R600 Localized edema: Secondary | ICD-10-CM | POA: Diagnosis not present

## 2021-02-20 DIAGNOSIS — I10 Essential (primary) hypertension: Secondary | ICD-10-CM | POA: Diagnosis not present

## 2021-02-20 DIAGNOSIS — E119 Type 2 diabetes mellitus without complications: Secondary | ICD-10-CM | POA: Diagnosis not present

## 2021-02-20 DIAGNOSIS — I5033 Acute on chronic diastolic (congestive) heart failure: Secondary | ICD-10-CM

## 2021-02-20 MED ORDER — POTASSIUM CHLORIDE CRYS ER 20 MEQ PO TBCR: 20.0000 meq | EXTENDED_RELEASE_TABLET | Freq: Every day | ORAL | 3 refills | Status: DC

## 2021-02-20 MED ORDER — FUROSEMIDE 40 MG PO TABS
40.0000 mg | ORAL_TABLET | Freq: Two times a day (BID) | ORAL | 11 refills | Status: DC
Start: 2021-02-20 — End: 2021-03-07

## 2021-02-20 NOTE — Patient Instructions (Signed)
Medication Instructions:  Your physician has recommended you make the following change in your medication:   Increase Lasix to 40 mg Two Times Daily  Start Potassium 20 meq Daily   *If you need a refill on your cardiac medications before your next appointment, please call your pharmacy*   Lab Work: Your physician recommends that you return for lab work in: 1 Week.   If you have labs (blood work) drawn today and your tests are completely normal, you will receive your results only by: Marland Kitchen MyChart Message (if you have MyChart) OR . A paper copy in the mail If you have any lab test that is abnormal or we need to change your treatment, we will call you to review the results.   Testing/Procedures: NONE    Follow-Up: At [facility], you and your health needs are our priority.  As part of our continuing mission to provide you with exceptional heart care, we have created designated Provider Care Teams.  These Care Teams include your primary Cardiologist (physician) and Advanced Practice Providers (APPs -  Physician Assistants and Nurse Practitioners) who all work together to provide you with the care you need, when you need it.  We recommend signing up for the patient portal called "MyChart".  Sign up information is provided on this After Visit Summary.  MyChart is used to connect with patients for Virtual Visits (Telemedicine).  Patients are able to view lab/test results, encounter notes, upcoming appointments, etc.  Non-urgent messages can be sent to your provider as well.   To learn more about what you can do with MyChart, go to [email or web address].    Your next appointment:   2 week(s)  The format for your next appointment:   In Person  Provider:   You may see Dorris Carnes, MD or one of the following Advanced Practice Providers on your designated Care Team:    Bernerd Pho, PA-C   Ermalinda Barrios, PA-C     Other Instructions Thank you for choosing [facility]!  Two Gram Sodium Diet 2000 mg  What is Sodium? Sodium is a mineral found naturally in many foods. The most significant source of sodium in the diet is table salt, which is about 40% sodium.  Processed, convenience, and preserved foods also contain a large amount of sodium.  The body needs only 500 mg of sodium daily to function,  A normal diet provides more than enough sodium even if you do not use salt.  Why Limit Sodium? A build up of sodium in the body can cause thirst, increased blood pressure, shortness of breath, and water retention.  Decreasing sodium in the diet can reduce edema and risk of heart attack or stroke associated with high blood pressure.  Keep in mind that there are many other factors involved in these health problems.  Heredity, obesity, lack of exercise, cigarette smoking, stress and what you eat all play a role.  General Guidelines:  Do not add salt at the table or in cooking.  One teaspoon of salt contains over 2 grams of sodium.  Read food labels  Avoid processed and convenience foods  Ask your dietitian before eating any foods not dicussed in the menu planning guidelines  Consult your physician if you wish to use a salt substitute or a sodium containing medication such as antacids.  Limit milk and milk products to 16 oz (2 cups) per day.  Shopping Hints:  READ LABELS!! "Dietetic" does not necessarily mean low sodium.  Salt  and other sodium ingredients are often added to foods during processing.   Menu Planning Guidelines Food Group Choose More Often Avoid  Beverages (see also the milk group All fruit juices, low-sodium, salt-free vegetables juices, low-sodium carbonated beverages Regular vegetable or tomato juices, commercially softened water used for drinking or cooking  Breads and Cereals Enriched white, wheat, rye and pumpernickel bread, hard rolls and dinner rolls; muffins, cornbread and waffles; most dry cereals, cooked cereal without added  salt; unsalted crackers and breadsticks; low sodium or homemade bread crumbs Bread, rolls and crackers with salted tops; quick breads; instant hot cereals; pancakes; commercial bread stuffing; self-rising flower and biscuit mixes; regular bread crumbs or cracker crumbs  Desserts and Sweets Desserts and sweets mad with mild should be within allowance Instant pudding mixes and cake mixes  Fats Butter or margarine; vegetable oils; unsalted salad dressings, regular salad dressings limited to 1 Tbs; light, sour and heavy cream Regular salad dressings containing bacon fat, bacon bits, and salt pork; snack dips made with instant soup mixes or processed cheese; salted nuts  Fruits Most fresh, frozen and canned fruits Fruits processed with salt or sodium-containing ingredient (some dried fruits are processed with sodium sulfites        Vegetables Fresh, frozen vegetables and low- sodium canned vegetables Regular canned vegetables, sauerkraut, pickled vegetables, and others prepared in brine; frozen vegetables in sauces; vegetables seasoned with ham, bacon or salt pork  Condiments, Sauces, Miscellaneous  Salt substitute with physician's approval; pepper, herbs, spices; vinegar, lemon or lime juice; hot pepper sauce; garlic powder, onion powder, low sodium soy sauce (1 Tbs.); low sodium condiments (ketchup, chili sauce, mustard) in limited amounts (1 tsp.) fresh ground horseradish; unsalted tortilla chips, pretzels, potato chips, popcorn, salsa (1/4 cup) Any seasoning made with salt including garlic salt, celery salt, onion salt, and seasoned salt; sea salt, rock salt, kosher salt; meat tenderizers; monosodium glutamate; mustard, regular soy sauce, barbecue, sauce, chili sauce, teriyaki sauce, steak sauce, Worcestershire sauce, and most flavored vinegars; canned gravy and mixes; regular condiments; salted snack foods, olives, picles, relish, horseradish sauce, catsup   Food preparation: Try these  seasonings Meats:    Pork Sage, onion Serve with applesauce  Chicken Poultry seasoning, thyme, parsley Serve with cranberry sauce  Lamb Curry powder, rosemary, garlic, thyme Serve with mint sauce or jelly  Veal Marjoram, basil Serve with current jelly, cranberry sauce  Beef Pepper, bay leaf Serve with dry mustard, unsalted chive butter  Fish [place] leaf, dill Serve with unsalted lemon butter, unsalted parsley butter  Vegetables:    Asparagus Lemon juice   Broccoli Lemon juice   Carrots Mustard dressing parsley, mint, nutmeg, glazed with unsalted butter and sugar   Green beans Marjoram, lemon juice, nutmeg,dill seed   Tomatoes Basil, marjoram, onion   Spice /blend for [employer]" 4 tsp ground thyme 1 tsp ground sage 3 tsp ground rosemary 4 tsp ground marjoram   Test your knowledge 1. A product that says "Salt Free" may still contain sodium. True or False 2. Garlic Powder and Hot Pepper Sauce an be used as alternative seasonings.True or False 3. Processed foods have more sodium than fresh foods.  True or False 4. Canned Vegetables have less sodium than froze True or False  WAYS TO DECREASE YOUR SODIUM INTAKE 1. Avoid the use of added salt in cooking and at the table.  Table salt (and other prepared seasonings which contain salt) is probably one of the greatest sources of sodium in the diet.  Unsalted foods can gain flavor from the sweet, sour, and butter taste sensations of herbs and spices.  Instead of using salt for seasoning, try the following seasonings with the foods listed.  Remember: how you use them to enhance natural food flavors is limited only by your creativity... Allspice-Meat, fish, eggs, fruit, peas, red and yellow vegetables Almond Extract-Fruit baked goods Anise Seed-Sweet breads, fruit, carrots, beets, cottage cheese, cookies (tastes like licorice) Basil-Meat, fish, eggs, vegetables, rice, vegetables salads, soups, sauces Bay Leaf-Meat, fish, stews, poultry Burnet-Salad,  vegetables (cucumber-like flavor) Caraway Seed-Bread, cookies, cottage cheese, meat, vegetables, cheese, rice Cardamon-Baked goods, fruit, soups Celery Powder or seed-Salads, salad dressings, sauces, meatloaf, soup, bread.Do not use  celery salt Chervil-Meats, salads, fish, eggs, vegetables, cottage cheese (parsley-like flavor) Chili Power-Meatloaf, chicken cheese, corn, eggplant, egg dishes Chives-Salads cottage cheese, egg dishes, soups, vegetables, sauces Cilantro-Salsa, casseroles Cinnamon-Baked goods, fruit, pork, lamb, chicken, carrots Cloves-Fruit, baked goods, fish, pot roast, green beans, beets, carrots Coriander-Pastry, cookies, meat, salads, cheese (lemon-orange flavor) Cumin-Meatloaf, fish,cheese, eggs, cabbage,fruit pie (caraway flavor) [employer], fruit, eggs, fish, poultry, cottage cheese, vegetables Dill Seed-Meat, cottage cheese, poultry, vegetables, fish, salads, bread Fennel Seed-Bread, cookies, apples, pork, eggs, fish, beets, cabbage, cheese, Licorice-like flavor Garlic-(buds or powder) Salads, meat, poultry, fish, bread, butter, vegetables, potatoes.Do not  use garlic salt Ginger-Fruit, vegetables, baked goods, meat, fish, poultry Horseradish Root-Meet, vegetables, butter Lemon Juice or Extract-Vegetables, fruit, tea, baked goods, fish salads Mace-Baked goods fruit, vegetables, fish, poultry (taste like nutmeg) Maple Extract-Syrups Marjoram-Meat, chicken, fish, vegetables, breads, green salads (taste like Sage) Mint-Tea, lamb, sherbet, vegetables, desserts, carrots, cabbage Mustard, Dry or Seed-Cheese, eggs, meats, vegetables, poultry Nutmeg-Baked goods, fruit, chicken, eggs, vegetables, desserts Onion Powder-Meat, fish, poultry, vegetables, cheese, eggs, bread, rice salads (Do not use   Onion salt) Orange Extract-Desserts, baked goods Oregano-Pasta, eggs, cheese, onions, pork, lamb, fish, chicken, vegetables, green salads Paprika-Meat, fish, poultry, eggs,  cheese, vegetables Parsley Flakes-Butter, vegetables, meat fish, poultry, eggs, bread, salads (certain forms may   Contain sodium Pepper-Meat fish, poultry, vegetables, eggs Peppermint Extract-Desserts, baked goods Poppy Seed-Eggs, bread, cheese, fruit dressings, baked goods, noodles, vegetables, cottage  [employer], poultry, meat, fish, cauliflower, turnips,eggs bread Saffron-Rice, bread, veal, chicken, fish, eggs Sage-Meat, fish, poultry, onions, eggplant, tomateos, pork, stews Savory-Eggs, salads, poultry, meat, rice, vegetables, soups, pork Tarragon-Meat, poultry, fish, eggs, butter, vegetables (licorice-like flavor)  Thyme-Meat, poultry, fish, eggs, vegetables, (clover-like flavor), sauces, soups Tumeric-Salads, butter, eggs, fish, rice, vegetables (saffron-like flavor) Vanilla Extract-Baked goods, candy Vinegar-Salads, vegetables, meat marinades Walnut Extract-baked goods, candy  2. Choose your Foods Wisely   The following is a list of foods to avoid which are high in sodium:  Meats-Avoid all smoked, canned, salt cured, dried and kosher meat and fish as well as Anchovies   [employer] meats:Bologna, Liverwurst, Pastrami Canned meat or fish  Marinated herring Caviar    Pepperoni Corned Beef   Pizza Dried chipped beef  Salami Frozen breaded fish or meat Salt pork Frankfurters or hot dogs  Sardines Gefilte fish   Sausage Ham (boiled ham, Proscuitto Smoked butt    spiced ham)   Spam      TV Dinners Vegetables Canned vegetables (Regular) Relish Canned mushrooms  Sauerkraut Olives    Tomato juice Pickles  Bakery and Dessert Products Canned puddings  Cream pies Cheesecake   Decorated cakes Cookies  Beverages/Juices Tomato juice, regular  Gatorade   V-8 vegetable juice, regular  Breads and Cereals Biscuit mixes   Salted  potato chips, corn chips, pretzels Bread stuffing mixes  Salted crackers and rolls Pancake and  waffle mixes Self-rising flour  Seasonings Accent    Meat sauces Barbecue sauce  Meat tenderizer Catsup    Monosodium glutamate (MSG) Celery salt   Onion salt Chili sauce   Prepared mustard Garlic salt   Salt, seasoned salt, sea salt Gravy mixes   Soy sauce Horseradish   Steak sauce Ketchup   Tartar sauce Lite salt    Teriyaki sauce Marinade mixes   Worcestershire sauce  Others Baking powder   Cocoa and cocoa mixes Baking soda   Commercial casserole mixes Candy-caramels, chocolate  Dehydrated soups    Bars, fudge,nougats  Instant rice and pasta mixes Canned broth or soup  Maraschino cherries Cheese, aged and processed cheese and cheese spreads  Learning Assessment Quiz  Indicated T (for True) or F (for False) for each of the following statements:  1. _____ Fresh fruits and vegetables and unprocessed grains are generally low in sodium 2. _____ Water may contain a considerable amount of sodium, depending on the source 3. _____ You can always tell if a food is high in sodium by tasting it 4. _____ Certain laxatives my be high in sodium and should be avoided unless prescribed   by a physician or pharmacist 5. _____ Salt substitutes may be used freely by anyone on a sodium restricted diet 6. _____ Sodium is present in table salt, food additives and as a natural component of   most foods 7. _____ Table salt is approximately 90% sodium 8. _____ Limiting sodium intake may help prevent excess fluid accumulation in the body 9. _____ On a sodium-restricted diet, seasonings such as bouillon soy sauce, and    cooking wine should be used in place of table salt 10. _____ On an ingredient list, a product which lists monosodium glutamate as the first   ingredient is an appropriate food to include on a low sodium diet  Circle the best answer(s) to the following statements (Hint: there may be more than one correct answer)  11. On a low-sodium diet, some acceptable snack items are:    A.  Olives  F. Bean dip   K. Grapefruit juice    B. Salted Pretzels G. Commercial Popcorn   L. Canned peaches    C. Carrot Sticks  H. Bouillon   M. Unsalted nuts   D. [place] fries  I. Peanut butter crackers N. Salami   E. Sweet pickles J. Tomato Juice   O. Pizza  12.  Seasonings that may be used freely on a reduced - sodium diet include   A. Lemon wedges F.Monosodium glutamate K. Celery seed    B.Soysauce   G. Pepper   L. Mustard powder   C. Sea salt  H. Cooking wine  M. Onion flakes   D. Vinegar  E. Prepared horseradish N. Salsa   E. Sage   J. Worcestershire sauce  O. Chutney  Heart-Healthy Eating Plan Many factors influence your heart (coronary) health, including eating and exercise habits. Coronary risk increases with abnormal blood fat (lipid) levels. Heart-healthy meal planning includes limiting unhealthy fats, increasing healthy fats, and making other diet and lifestyle changes. What is my plan? Your health care provider may recommend that you:  Limit your fat intake to _________% or less of your total calories each day.  Limit your saturated fat intake to _________% or less of your total calories each day.  Limit the amount of cholesterol in your diet  to less than _________ mg per day. What are tips for following this plan? Cooking Cook foods using methods other than frying. Baking, boiling, grilling, and broiling are all good options. Other ways to reduce fat include:  Removing the skin from poultry.  Removing all visible fats from meats.  Steaming vegetables in water or broth. Meal planning  At meals, imagine dividing your plate into fourths: ? Fill one-half of your plate with vegetables and green salads. ? Fill one-fourth of your plate with whole grains. ? Fill one-fourth of your plate with lean protein foods.  Eat 4-5 servings of vegetables per day. One serving equals 1 cup raw or cooked vegetable, or 2 cups raw leafy greens.  Eat 4-5 servings of fruit per  day. One serving equals 1 medium whole fruit,  cup dried fruit,  cup fresh, frozen, or canned fruit, or  cup 100% fruit juice.  Eat more foods that contain soluble fiber. Examples include apples, broccoli, carrots, beans, peas, and barley. Aim to get 25-30 g of fiber per day.  Increase your consumption of legumes, nuts, and seeds to 4-5 servings per week. One serving of dried beans or legumes equals  cup cooked, 1 serving of nuts is  cup, and 1 serving of seeds equals 1 tablespoon.   Fats  Choose healthy fats more often. Choose monounsaturated and polyunsaturated fats, such as olive and canola oils, flaxseeds, walnuts, almonds, and seeds.  Eat more omega-3 fats. Choose salmon, mackerel, sardines, tuna, flaxseed oil, and ground flaxseeds. Aim to eat fish at least 2 times each week.  Check food labels carefully to identify foods with trans fats or high amounts of saturated fat.  Limit saturated fats. These are found in animal products, such as meats, butter, and cream. Plant sources of saturated fats include palm oil, palm kernel oil, and coconut oil.  Avoid foods with partially hydrogenated oils in them. These contain trans fats. Examples are stick margarine, some tub margarines, cookies, crackers, and other baked goods.  Avoid fried foods. General information  Eat more home-cooked food and less restaurant, buffet, and fast food.  Limit or avoid alcohol.  Limit foods that are high in starch and sugar.  Lose weight if you are overweight. Losing just 5-10% of your body weight can help your overall health and prevent diseases such as diabetes and heart disease.  Monitor your salt (sodium) intake, especially if you have high blood pressure. Talk with your health care provider about your sodium intake.  Try to incorporate more vegetarian meals weekly. What foods can I eat? Fruits All fresh, canned (in natural juice), or frozen fruits. Vegetables Fresh or frozen vegetables (raw,  steamed, roasted, or grilled). Green salads. Grains Most grains. Choose whole wheat and whole grains most of the time. Rice and pasta, including brown rice and pastas made with whole wheat. Meats and other proteins Lean, well-trimmed beef, veal, pork, and lamb. Chicken and [place] without skin. All fish and shellfish. Wild duck, rabbit, pheasant, and venison. Egg whites or low-cholesterol egg substitutes. Dried beans, peas, lentils, and tofu. Seeds and most nuts. Dairy Low-fat or nonfat cheeses, including ricotta and mozzarella. Skim or 1% milk (liquid, powdered, or evaporated). Buttermilk made with low-fat milk. Nonfat or low-fat yogurt. Fats and oils Non-hydrogenated (trans-free) margarines. Vegetable oils, including soybean, sesame, sunflower, olive, peanut, safflower, corn, canola, and cottonseed. Salad dressings or mayonnaise made with a vegetable oil. Beverages Water (mineral or sparkling). Coffee and tea. Diet carbonated beverages. Sweets and desserts Sherbet, gelatin,  and fruit ice. Small amounts of dark chocolate. Limit all sweets and desserts. Seasonings and condiments All seasonings and condiments. The items listed above may not be a complete list of foods and beverages you can eat. Contact a dietitian for more options. What foods are not recommended? Fruits Canned fruit in heavy syrup. Fruit in cream or butter sauce. Fried fruit. Limit coconut. Vegetables Vegetables cooked in cheese, cream, or butter sauce. Fried vegetables. Grains Breads made with saturated or trans fats, oils, or whole milk. Croissants. Sweet rolls. Donuts. High-fat crackers, such as cheese crackers. Meats and other proteins Fatty meats, such as hot dogs, ribs, sausage, bacon, rib-eye roast or steak. High-fat deli meats, such as salami and bologna. Caviar. Domestic duck and goose. Organ meats, such as liver. Dairy Cream, sour cream, cream cheese, and creamed cottage cheese. Whole milk cheeses. Whole or 2% milk  (liquid, evaporated, or condensed). Whole buttermilk. Cream sauce or high-fat cheese sauce. Whole-milk yogurt. Fats and oils Meat fat, or shortening. Cocoa butter, hydrogenated oils, palm oil, coconut oil, palm kernel oil. Solid fats and shortenings, including bacon fat, salt pork, lard, and butter. Nondairy cream substitutes. Salad dressings with cheese or sour cream. Beverages Regular sodas and any drinks with added sugar. Sweets and desserts Frosting. Pudding. Cookies. Cakes. Pies. Milk chocolate or white chocolate. Buttered syrups. Full-fat ice cream or ice cream drinks. The items listed above may not be a complete list of foods and beverages to avoid. Contact a dietitian for more information. Summary  Heart-healthy meal planning includes limiting unhealthy fats, increasing healthy fats, and making other diet and lifestyle changes.  Lose weight if you are overweight. Losing just 5-10% of your body weight can help your overall health and prevent diseases such as diabetes and heart disease.  Focus on eating a balance of foods, including fruits and vegetables, low-fat or nonfat dairy, lean protein, nuts and legumes, whole grains, and heart-healthy oils and fats. This information is not intended to replace advice given to you by your health care provider. Make sure you discuss any questions you have with your health care provider. Document Revised: 11/19/2017 Document Reviewed: 11/19/2017 Elsevier Patient Education  2021 [employer].

## 2021-02-26 ENCOUNTER — Other Ambulatory Visit: Payer: Medicare Other

## 2021-02-26 DIAGNOSIS — I130 Hypertensive heart and chronic kidney disease with heart failure and stage 1 through stage 4 chronic kidney disease, or unspecified chronic kidney disease: Secondary | ICD-10-CM | POA: Diagnosis not present

## 2021-02-26 DIAGNOSIS — W19XXXD Unspecified fall, subsequent encounter: Secondary | ICD-10-CM | POA: Diagnosis not present

## 2021-02-26 DIAGNOSIS — I69054 Hemiplegia and hemiparesis following nontraumatic subarachnoid hemorrhage affecting left non-dominant side: Secondary | ICD-10-CM | POA: Diagnosis not present

## 2021-02-26 DIAGNOSIS — I5030 Unspecified diastolic (congestive) heart failure: Secondary | ICD-10-CM | POA: Diagnosis not present

## 2021-02-26 DIAGNOSIS — I639 Cerebral infarction, unspecified: Secondary | ICD-10-CM | POA: Diagnosis not present

## 2021-02-26 DIAGNOSIS — I10 Essential (primary) hypertension: Secondary | ICD-10-CM | POA: Diagnosis not present

## 2021-02-26 DIAGNOSIS — Z9114 Patient's other noncompliance with medication regimen: Secondary | ICD-10-CM | POA: Diagnosis not present

## 2021-02-26 DIAGNOSIS — I4891 Unspecified atrial fibrillation: Secondary | ICD-10-CM | POA: Diagnosis not present

## 2021-02-27 DIAGNOSIS — M160 Bilateral primary osteoarthritis of hip: Secondary | ICD-10-CM | POA: Diagnosis not present

## 2021-02-27 DIAGNOSIS — M1711 Unilateral primary osteoarthritis, right knee: Secondary | ICD-10-CM | POA: Diagnosis not present

## 2021-02-27 DIAGNOSIS — S8262XD Displaced fracture of lateral malleolus of left fibula, subsequent encounter for closed fracture with routine healing: Secondary | ICD-10-CM | POA: Diagnosis not present

## 2021-03-03 DIAGNOSIS — E119 Type 2 diabetes mellitus without complications: Secondary | ICD-10-CM | POA: Diagnosis not present

## 2021-03-03 DIAGNOSIS — J449 Chronic obstructive pulmonary disease, unspecified: Secondary | ICD-10-CM | POA: Diagnosis not present

## 2021-03-03 DIAGNOSIS — R52 Pain, unspecified: Secondary | ICD-10-CM | POA: Diagnosis not present

## 2021-03-03 DIAGNOSIS — G629 Polyneuropathy, unspecified: Secondary | ICD-10-CM | POA: Diagnosis not present

## 2021-03-03 DIAGNOSIS — I130 Hypertensive heart and chronic kidney disease with heart failure and stage 1 through stage 4 chronic kidney disease, or unspecified chronic kidney disease: Secondary | ICD-10-CM | POA: Diagnosis not present

## 2021-03-04 DIAGNOSIS — R339 Retention of urine, unspecified: Secondary | ICD-10-CM | POA: Diagnosis not present

## 2021-03-04 DIAGNOSIS — I10 Essential (primary) hypertension: Secondary | ICD-10-CM | POA: Diagnosis not present

## 2021-03-04 DIAGNOSIS — G629 Polyneuropathy, unspecified: Secondary | ICD-10-CM | POA: Diagnosis not present

## 2021-03-06 DIAGNOSIS — I5042 Chronic combined systolic (congestive) and diastolic (congestive) heart failure: Secondary | ICD-10-CM | POA: Diagnosis not present

## 2021-03-06 DIAGNOSIS — I4891 Unspecified atrial fibrillation: Secondary | ICD-10-CM | POA: Diagnosis not present

## 2021-03-06 DIAGNOSIS — I10 Essential (primary) hypertension: Secondary | ICD-10-CM | POA: Diagnosis not present

## 2021-03-06 DIAGNOSIS — I130 Hypertensive heart and chronic kidney disease with heart failure and stage 1 through stage 4 chronic kidney disease, or unspecified chronic kidney disease: Secondary | ICD-10-CM | POA: Diagnosis not present

## 2021-03-06 DIAGNOSIS — I639 Cerebral infarction, unspecified: Secondary | ICD-10-CM | POA: Diagnosis not present

## 2021-03-06 DIAGNOSIS — I5030 Unspecified diastolic (congestive) heart failure: Secondary | ICD-10-CM | POA: Diagnosis not present

## 2021-03-06 DIAGNOSIS — R600 Localized edema: Secondary | ICD-10-CM | POA: Diagnosis not present

## 2021-03-06 DIAGNOSIS — N1832 Chronic kidney disease, stage 3b: Secondary | ICD-10-CM | POA: Diagnosis not present

## 2021-03-06 DIAGNOSIS — E119 Type 2 diabetes mellitus without complications: Secondary | ICD-10-CM | POA: Diagnosis not present

## 2021-03-06 DIAGNOSIS — J449 Chronic obstructive pulmonary disease, unspecified: Secondary | ICD-10-CM | POA: Diagnosis not present

## 2021-03-07 ENCOUNTER — Telehealth: Payer: Self-pay

## 2021-03-07 MED ORDER — FUROSEMIDE 40 MG PO TABS: 40.0000 mg | ORAL_TABLET | Freq: Every day | ORAL | 6 refills | Status: DC

## 2021-03-07 NOTE — Telephone Encounter (Signed)
Spoke to pt's nurse who stated that they are having to wrap pt's leg due to weeping. Nurse also stated that they were giving her Furosemide 20 mg tablets BID instead of 40 mg BID. Nurse verbalized understanding regarding Bernerd Pho, PA-C's instructions for medication management. Will fax instructions to [facility].

## 2021-03-07 NOTE — Telephone Encounter (Signed)
-----   Message from Erma Heritage, [place] sent at 03/07/2021 10:41 AM EDT ----- Labs received from [facility] (were ordered for 5/5 but not drawn until 5/12 by review of the lab slip). Her BNP is mildly elevated at 218 but improved from 12/2020. K+ is at the high-end of normal at 5.1 and creatinine is elevated to 2.31 (baseline 1.5 - 1.6). Her weight was at 213 lbs during her office visit and her SNF informed us this was down to 198 lbs on 5/10. Given the significant weight change, I would recommend we reduce her Lasix from 40mg  BID to 40mg  daily. Stop K-dur 20 mEq for now. Keep scheduled follow-up on 03/13/2021 for reassessment and would anticipate a repeat BMET at that time.

## 2021-03-09 ENCOUNTER — Emergency Department (HOSPITAL_COMMUNITY): Payer: Medicare Other

## 2021-03-09 ENCOUNTER — Inpatient Hospital Stay (HOSPITAL_COMMUNITY)
Admission: EM | Admit: 2021-03-09 | Discharge: 2021-03-11 | DRG: 638 | Disposition: A | Discharge status: Skilled Nursing Facility | Payer: Medicare Other | Source: Skilled Nursing Facility | Attending: Family Medicine | Admitting: Family Medicine

## 2021-03-09 ENCOUNTER — Encounter (HOSPITAL_COMMUNITY): Payer: Self-pay

## 2021-03-09 ENCOUNTER — Other Ambulatory Visit: Payer: Self-pay

## 2021-03-09 DIAGNOSIS — I10 Essential (primary) hypertension: Secondary | ICD-10-CM | POA: Diagnosis not present

## 2021-03-09 DIAGNOSIS — Z803 Family history of malignant neoplasm of breast: Secondary | ICD-10-CM | POA: Diagnosis not present

## 2021-03-09 DIAGNOSIS — I480 Paroxysmal atrial fibrillation: Secondary | ICD-10-CM | POA: Diagnosis present

## 2021-03-09 DIAGNOSIS — G4730 Sleep apnea, unspecified: Secondary | ICD-10-CM | POA: Diagnosis not present

## 2021-03-09 DIAGNOSIS — E1165 Type 2 diabetes mellitus with hyperglycemia: Secondary | ICD-10-CM | POA: Diagnosis not present

## 2021-03-09 DIAGNOSIS — F419 Anxiety disorder, unspecified: Secondary | ICD-10-CM | POA: Diagnosis present

## 2021-03-09 DIAGNOSIS — Z6838 Body mass index (BMI) 38.0-38.9, adult: Secondary | ICD-10-CM

## 2021-03-09 DIAGNOSIS — E11649 Type 2 diabetes mellitus with hypoglycemia without coma: Secondary | ICD-10-CM | POA: Diagnosis not present

## 2021-03-09 DIAGNOSIS — R569 Unspecified convulsions: Secondary | ICD-10-CM | POA: Diagnosis not present

## 2021-03-09 DIAGNOSIS — R600 Localized edema: Secondary | ICD-10-CM

## 2021-03-09 DIAGNOSIS — J3489 Other specified disorders of nose and nasal sinuses: Secondary | ICD-10-CM | POA: Diagnosis not present

## 2021-03-09 DIAGNOSIS — N1832 Chronic kidney disease, stage 3b: Secondary | ICD-10-CM | POA: Diagnosis not present

## 2021-03-09 DIAGNOSIS — E785 Hyperlipidemia, unspecified: Secondary | ICD-10-CM | POA: Diagnosis present

## 2021-03-09 DIAGNOSIS — N183 Chronic kidney disease, stage 3 unspecified: Secondary | ICD-10-CM | POA: Diagnosis present

## 2021-03-09 DIAGNOSIS — L304 Erythema intertrigo: Secondary | ICD-10-CM | POA: Diagnosis not present

## 2021-03-09 DIAGNOSIS — Z7952 Long term (current) use of systemic steroids: Secondary | ICD-10-CM

## 2021-03-09 DIAGNOSIS — L89156 Pressure-induced deep tissue damage of sacral region: Secondary | ICD-10-CM | POA: Diagnosis present

## 2021-03-09 DIAGNOSIS — L97919 Non-pressure chronic ulcer of unspecified part of right lower leg with unspecified severity: Secondary | ICD-10-CM | POA: Diagnosis present

## 2021-03-09 DIAGNOSIS — L97929 Non-pressure chronic ulcer of unspecified part of left lower leg with unspecified severity: Secondary | ICD-10-CM | POA: Diagnosis not present

## 2021-03-09 DIAGNOSIS — I6381 Other cerebral infarction due to occlusion or stenosis of small artery: Secondary | ICD-10-CM | POA: Diagnosis not present

## 2021-03-09 DIAGNOSIS — E1122 Type 2 diabetes mellitus with diabetic chronic kidney disease: Secondary | ICD-10-CM | POA: Diagnosis present

## 2021-03-09 DIAGNOSIS — Z794 Long term (current) use of insulin: Secondary | ICD-10-CM

## 2021-03-09 DIAGNOSIS — I5042 Chronic combined systolic (congestive) and diastolic (congestive) heart failure: Secondary | ICD-10-CM | POA: Diagnosis not present

## 2021-03-09 DIAGNOSIS — Z8249 Family history of ischemic heart disease and other diseases of the circulatory system: Secondary | ICD-10-CM | POA: Diagnosis not present

## 2021-03-09 DIAGNOSIS — L8990 Pressure ulcer of unspecified site, unspecified stage: Secondary | ICD-10-CM | POA: Diagnosis present

## 2021-03-09 DIAGNOSIS — G894 Chronic pain syndrome: Secondary | ICD-10-CM | POA: Diagnosis not present

## 2021-03-09 DIAGNOSIS — I472 Ventricular tachycardia: Secondary | ICD-10-CM | POA: Diagnosis not present

## 2021-03-09 DIAGNOSIS — R739 Hyperglycemia, unspecified: Secondary | ICD-10-CM | POA: Diagnosis not present

## 2021-03-09 DIAGNOSIS — F17210 Nicotine dependence, cigarettes, uncomplicated: Secondary | ICD-10-CM | POA: Diagnosis not present

## 2021-03-09 DIAGNOSIS — R0902 Hypoxemia: Secondary | ICD-10-CM | POA: Diagnosis not present

## 2021-03-09 DIAGNOSIS — I429 Cardiomyopathy, unspecified: Secondary | ICD-10-CM | POA: Diagnosis not present

## 2021-03-09 DIAGNOSIS — R000 Tachycardia, unspecified: Secondary | ICD-10-CM | POA: Diagnosis not present

## 2021-03-09 DIAGNOSIS — Z7901 Long term (current) use of anticoagulants: Secondary | ICD-10-CM

## 2021-03-09 DIAGNOSIS — L98421 Non-pressure chronic ulcer of back limited to breakdown of skin: Secondary | ICD-10-CM | POA: Diagnosis present

## 2021-03-09 DIAGNOSIS — I700 Atherosclerosis of aorta: Secondary | ICD-10-CM | POA: Diagnosis present

## 2021-03-09 DIAGNOSIS — E669 Obesity, unspecified: Secondary | ICD-10-CM | POA: Diagnosis present

## 2021-03-09 DIAGNOSIS — E1100 Type 2 diabetes mellitus with hyperosmolarity without nonketotic hyperglycemic-hyperosmolar coma (NKHHC): Secondary | ICD-10-CM | POA: Diagnosis not present

## 2021-03-09 DIAGNOSIS — I130 Hypertensive heart and chronic kidney disease with heart failure and stage 1 through stage 4 chronic kidney disease, or unspecified chronic kidney disease: Secondary | ICD-10-CM | POA: Diagnosis not present

## 2021-03-09 DIAGNOSIS — Z79899 Other long term (current) drug therapy: Secondary | ICD-10-CM

## 2021-03-09 DIAGNOSIS — K219 Gastro-esophageal reflux disease without esophagitis: Secondary | ICD-10-CM | POA: Diagnosis present

## 2021-03-09 DIAGNOSIS — J440 Chronic obstructive pulmonary disease with acute lower respiratory infection: Secondary | ICD-10-CM | POA: Diagnosis not present

## 2021-03-09 DIAGNOSIS — E875 Hyperkalemia: Secondary | ICD-10-CM | POA: Diagnosis present

## 2021-03-09 DIAGNOSIS — I517 Cardiomegaly: Secondary | ICD-10-CM | POA: Diagnosis not present

## 2021-03-09 DIAGNOSIS — Z9114 Patient's other noncompliance with medication regimen: Secondary | ICD-10-CM

## 2021-03-09 DIAGNOSIS — J449 Chronic obstructive pulmonary disease, unspecified: Secondary | ICD-10-CM | POA: Diagnosis present

## 2021-03-09 DIAGNOSIS — Z7984 Long term (current) use of oral hypoglycemic drugs: Secondary | ICD-10-CM

## 2021-03-09 DIAGNOSIS — B370 Candidal stomatitis: Secondary | ICD-10-CM | POA: Diagnosis present

## 2021-03-09 DIAGNOSIS — I6782 Cerebral ischemia: Secondary | ICD-10-CM | POA: Diagnosis not present

## 2021-03-09 DIAGNOSIS — I6529 Occlusion and stenosis of unspecified carotid artery: Secondary | ICD-10-CM | POA: Diagnosis not present

## 2021-03-09 DIAGNOSIS — E1142 Type 2 diabetes mellitus with diabetic polyneuropathy: Secondary | ICD-10-CM | POA: Diagnosis present

## 2021-03-09 DIAGNOSIS — N179 Acute kidney failure, unspecified: Secondary | ICD-10-CM | POA: Diagnosis present

## 2021-03-09 DIAGNOSIS — Z20822 Contact with and (suspected) exposure to covid-19: Secondary | ICD-10-CM | POA: Diagnosis not present

## 2021-03-09 DIAGNOSIS — S81801A Unspecified open wound, right lower leg, initial encounter: Secondary | ICD-10-CM

## 2021-03-09 DIAGNOSIS — E1110 Type 2 diabetes mellitus with ketoacidosis without coma: Secondary | ICD-10-CM | POA: Diagnosis present

## 2021-03-09 DIAGNOSIS — E1349 Other specified diabetes mellitus with other diabetic neurological complication: Secondary | ICD-10-CM | POA: Diagnosis not present

## 2021-03-09 DIAGNOSIS — E860 Dehydration: Secondary | ICD-10-CM | POA: Diagnosis present

## 2021-03-09 DIAGNOSIS — E1140 Type 2 diabetes mellitus with diabetic neuropathy, unspecified: Secondary | ICD-10-CM | POA: Diagnosis present

## 2021-03-09 DIAGNOSIS — G40909 Epilepsy, unspecified, not intractable, without status epilepticus: Secondary | ICD-10-CM

## 2021-03-09 DIAGNOSIS — S81802A Unspecified open wound, left lower leg, initial encounter: Secondary | ICD-10-CM

## 2021-03-09 LAB — PHOSPHORUS: Phosphorus: 4.8 mg/dL — ABNORMAL HIGH (ref 2.5–4.6)

## 2021-03-09 LAB — CBC WITH DIFFERENTIAL/PLATELET
Abs Immature Granulocytes: 0.04 10*3/uL (ref 0.00–0.07)
Basophils Absolute: 0 10*3/uL (ref 0.0–0.1)
Basophils Relative: 0 %
Eosinophils Absolute: 0 10*3/uL (ref 0.0–0.5)
Eosinophils Relative: 0 %
HCT: 41.3 % (ref 36.0–46.0)
Hemoglobin: 12.8 g/dL (ref 12.0–15.0)
Immature Granulocytes: 1 %
Lymphocytes Relative: 2 %
Lymphs Abs: 0.2 10*3/uL — ABNORMAL LOW (ref 0.7–4.0)
MCH: 29.8 pg (ref 26.0–34.0)
MCHC: 31 g/dL (ref 30.0–36.0)
MCV: 96 fL (ref 80.0–100.0)
Monocytes Absolute: 0 10*3/uL — ABNORMAL LOW (ref 0.1–1.0)
Monocytes Relative: 0 %
Neutro Abs: 8.3 10*3/uL — ABNORMAL HIGH (ref 1.7–7.7)
Neutrophils Relative %: 97 %
Platelets: 170 10*3/uL (ref 150–400)
RBC: 4.3 MIL/uL (ref 3.87–5.11)
RDW: 15.9 % — ABNORMAL HIGH (ref 11.5–15.5)
WBC: 8.6 10*3/uL (ref 4.0–10.5)
nRBC: 0 % (ref 0.0–0.2)

## 2021-03-09 LAB — BASIC METABOLIC PANEL
Anion gap: 9 (ref 5–15)
BUN: 61 mg/dL — ABNORMAL HIGH (ref 6–20)
CO2: 26 mmol/L (ref 22–32)
Calcium: 8.2 mg/dL — ABNORMAL LOW (ref 8.9–10.3)
Chloride: 98 mmol/L (ref 98–111)
Creatinine, Ser: 2.12 mg/dL — ABNORMAL HIGH (ref 0.44–1.00)
GFR, Estimated: 27 mL/min — ABNORMAL LOW (ref >60)
Glucose, Bld: 414 mg/dL — ABNORMAL HIGH (ref 70–99)
Potassium: 5 mmol/L (ref 3.5–5.1)
Sodium: 133 mmol/L — ABNORMAL LOW (ref 135–145)

## 2021-03-09 LAB — RESP PANEL BY RT-PCR (FLU A&B, COVID) ARPGX2
Influenza A by PCR: NEGATIVE
Influenza B by PCR: NEGATIVE
SARS Coronavirus 2 by RT PCR: NEGATIVE

## 2021-03-09 LAB — CBG MONITORING, ED
Glucose-Capillary: 551 mg/dL (ref 70–99)
Glucose-Capillary: 391 mg/dL — ABNORMAL HIGH (ref 70–99)
Glucose-Capillary: 397 mg/dL — ABNORMAL HIGH (ref 70–99)

## 2021-03-09 LAB — COMPREHENSIVE METABOLIC PANEL
ALT: 24 U/L (ref 0–44)
AST: 12 U/L — ABNORMAL LOW (ref 15–41)
Albumin: 2.7 g/dL — ABNORMAL LOW (ref 3.5–5.0)
Alkaline Phosphatase: 81 U/L (ref 38–126)
Anion gap: 10 (ref 5–15)
BUN: 61 mg/dL — ABNORMAL HIGH (ref 6–20)
CO2: 26 mmol/L (ref 22–32)
Calcium: 9 mg/dL (ref 8.9–10.3)
Chloride: 99 mmol/L (ref 98–111)
Creatinine, Ser: 2.14 mg/dL — ABNORMAL HIGH (ref 0.44–1.00)
GFR, Estimated: 27 mL/min — ABNORMAL LOW (ref >60)
Glucose, Bld: 520 mg/dL (ref 70–99)
Potassium: 4.5 mmol/L (ref 3.5–5.1)
Sodium: 135 mmol/L (ref 135–145)
Total Bilirubin: 0.7 mg/dL (ref 0.3–1.2)
Total Protein: 6 g/dL — ABNORMAL LOW (ref 6.5–8.1)

## 2021-03-09 LAB — BRAIN NATRIURETIC PEPTIDE: B Natriuretic Peptide: 87 pg/mL (ref 0.0–100.0)

## 2021-03-09 LAB — MAGNESIUM: Magnesium: 1.9 mg/dL (ref 1.7–2.4)

## 2021-03-09 LAB — GLUCOSE, CAPILLARY: Glucose-Capillary: 357 mg/dL — ABNORMAL HIGH (ref 70–99)

## 2021-03-09 MED ORDER — OXYBUTYNIN CHLORIDE ER 5 MG PO TB24
5.0000 mg | ORAL_TABLET | Freq: Every day | ORAL | Status: DC
  Administered 2021-03-09 – 2021-03-10 (×2): 5 mg via ORAL
  Filled 2021-03-09 (×2): qty 1

## 2021-03-09 MED ORDER — ZINC SULFATE 220 (50 ZN) MG PO CAPS
220.0000 mg | ORAL_CAPSULE | Freq: Every day | ORAL | Status: DC
  Administered 2021-03-10 – 2021-03-11 (×2): 220 mg via ORAL
  Filled 2021-03-09 (×2): qty 1

## 2021-03-09 MED ORDER — INSULIN GLARGINE 100 UNIT/ML ~~LOC~~ SOLN
25.0000 [IU] | Freq: Two times a day (BID) | SUBCUTANEOUS | Status: DC
Start: 2021-03-09 — End: 2021-03-11
  Administered 2021-03-09 – 2021-03-11 (×4): 25 [IU] via SUBCUTANEOUS
  Filled 2021-03-09 (×6): qty 0.25

## 2021-03-09 MED ORDER — TIOTROPIUM BROMIDE MONOHYDRATE 18 MCG IN CAPS: 18.0000 ug | ORAL_CAPSULE | Freq: Every day | RESPIRATORY_TRACT | Status: DC | PRN

## 2021-03-09 MED ORDER — LORAZEPAM 0.5 MG PO TABS
0.5000 mg | ORAL_TABLET | Freq: Three times a day (TID) | ORAL | Status: DC | PRN
  Administered 2021-03-10 – 2021-03-11 (×3): 0.5 mg via ORAL
  Filled 2021-03-09 (×3): qty 1

## 2021-03-09 MED ORDER — AMLODIPINE BESYLATE 5 MG PO TABS
10.0000 mg | ORAL_TABLET | Freq: Every day | ORAL | Status: DC
  Administered 2021-03-10 – 2021-03-11 (×2): 10 mg via ORAL
  Filled 2021-03-09 (×2): qty 2

## 2021-03-09 MED ORDER — UMECLIDINIUM BROMIDE 62.5 MCG/INH IN AEPB
1.0000 | INHALATION_SPRAY | Freq: Every day | RESPIRATORY_TRACT | Status: DC
  Filled 2021-03-09: qty 7

## 2021-03-09 MED ORDER — LEVETIRACETAM 500 MG PO TABS
750.0000 mg | ORAL_TABLET | Freq: Two times a day (BID) | ORAL | Status: DC
  Administered 2021-03-09 – 2021-03-11 (×4): 750 mg via ORAL
  Filled 2021-03-09 (×4): qty 1

## 2021-03-09 MED ORDER — SODIUM CHLORIDE 0.9 % IV BOLUS
500.0000 mL | Freq: Once | INTRAVENOUS | Status: AC
  Administered 2021-03-09: 500 mL via INTRAVENOUS

## 2021-03-09 MED ORDER — TRAZODONE HCL 50 MG PO TABS
50.0000 mg | ORAL_TABLET | Freq: Every day | ORAL | Status: DC
  Administered 2021-03-09 – 2021-03-10 (×2): 50 mg via ORAL
  Filled 2021-03-09 (×2): qty 1

## 2021-03-09 MED ORDER — LINAGLIPTIN 5 MG PO TABS
5.0000 mg | ORAL_TABLET | Freq: Every day | ORAL | Status: DC
  Administered 2021-03-10 – 2021-03-11 (×2): 5 mg via ORAL
  Filled 2021-03-09 (×2): qty 1

## 2021-03-09 MED ORDER — DULOXETINE HCL 30 MG PO CPEP
60.0000 mg | ORAL_CAPSULE | Freq: Every day | ORAL | Status: DC
  Administered 2021-03-10 – 2021-03-11 (×2): 60 mg via ORAL
  Filled 2021-03-09 (×2): qty 2

## 2021-03-09 MED ORDER — PANTOPRAZOLE SODIUM 40 MG PO TBEC
40.0000 mg | DELAYED_RELEASE_TABLET | Freq: Two times a day (BID) | ORAL | Status: DC
  Administered 2021-03-10 – 2021-03-11 (×3): 40 mg via ORAL
  Filled 2021-03-09 (×3): qty 1

## 2021-03-09 MED ORDER — SODIUM CHLORIDE 0.9 % IV BOLUS
1000.0000 mL | Freq: Once | INTRAVENOUS | Status: AC
  Administered 2021-03-09: 1000 mL via INTRAVENOUS

## 2021-03-09 MED ORDER — APIXABAN 5 MG PO TABS
5.0000 mg | ORAL_TABLET | Freq: Two times a day (BID) | ORAL | Status: DC
  Administered 2021-03-09 – 2021-03-11 (×4): 5 mg via ORAL
  Filled 2021-03-09 (×4): qty 1

## 2021-03-09 MED ORDER — MONTELUKAST SODIUM 10 MG PO TABS
10.0000 mg | ORAL_TABLET | Freq: Every day | ORAL | Status: DC
  Administered 2021-03-09 – 2021-03-10 (×2): 10 mg via ORAL
  Filled 2021-03-09 (×2): qty 1

## 2021-03-09 MED ORDER — ASCORBIC ACID 500 MG PO TABS
500.0000 mg | ORAL_TABLET | Freq: Every day | ORAL | Status: DC
  Administered 2021-03-10 – 2021-03-11 (×2): 500 mg via ORAL
  Filled 2021-03-09 (×2): qty 1

## 2021-03-09 MED ORDER — INSULIN ASPART 100 UNIT/ML IJ SOLN
15.0000 [IU] | Freq: Once | INTRAMUSCULAR | Status: AC
  Administered 2021-03-09: 15 [IU] via SUBCUTANEOUS

## 2021-03-09 MED ORDER — ROSUVASTATIN CALCIUM 10 MG PO TABS
10.0000 mg | ORAL_TABLET | Freq: Every day | ORAL | Status: DC
  Administered 2021-03-10 – 2021-03-11 (×2): 10 mg via ORAL
  Filled 2021-03-09 (×2): qty 1

## 2021-03-09 MED ORDER — SODIUM CHLORIDE 0.9 % IV SOLN: INTRAVENOUS | Status: DC

## 2021-03-09 MED ORDER — ACETAMINOPHEN 650 MG RE SUPP: 650.0000 mg | Freq: Four times a day (QID) | RECTAL | Status: DC | PRN

## 2021-03-09 MED ORDER — ACETAMINOPHEN 325 MG PO TABS: 650.0000 mg | ORAL_TABLET | Freq: Four times a day (QID) | ORAL | Status: DC | PRN

## 2021-03-09 MED ORDER — OXYCODONE HCL 5 MG PO TABS
5.0000 mg | ORAL_TABLET | Freq: Three times a day (TID) | ORAL | Status: DC | PRN
  Administered 2021-03-09 – 2021-03-11 (×5): 5 mg via ORAL
  Filled 2021-03-09 (×5): qty 1

## 2021-03-09 MED ORDER — SODIUM CHLORIDE 0.45 % IV SOLN: INTRAVENOUS | Status: DC

## 2021-03-09 MED ORDER — GABAPENTIN 300 MG PO CAPS
300.0000 mg | ORAL_CAPSULE | Freq: Three times a day (TID) | ORAL | Status: DC
  Administered 2021-03-09 – 2021-03-11 (×5): 300 mg via ORAL
  Filled 2021-03-09 (×5): qty 1

## 2021-03-09 MED ORDER — INSULIN ASPART 100 UNIT/ML IJ SOLN
10.0000 [IU] | Freq: Once | INTRAMUSCULAR | Status: AC
  Administered 2021-03-09: 10 [IU] via INTRAVENOUS
  Filled 2021-03-09: qty 1

## 2021-03-09 MED ORDER — ZINC OXIDE 20 % EX OINT
1.0000 "application " | TOPICAL_OINTMENT | Freq: Every day | CUTANEOUS | Status: DC
  Administered 2021-03-10 – 2021-03-11 (×2): 1 via TOPICAL
  Filled 2021-03-09: qty 28.35

## 2021-03-09 MED ORDER — ALBUTEROL SULFATE HFA 108 (90 BASE) MCG/ACT IN AERS: 2.0000 | INHALATION_SPRAY | Freq: Four times a day (QID) | RESPIRATORY_TRACT | Status: DC | PRN

## 2021-03-09 NOTE — ED Notes (Signed)
Notified Dr. Olevia Bowens of most recent CBG.

## 2021-03-09 NOTE — ED Notes (Signed)
Pt sitting up eating dinner tray. Bilateral LE had been dressed with nonadhesive bandages and re-wrapped with ace bandages.

## 2021-03-09 NOTE — ED Notes (Signed)
Spoke with lab to add mag and phosphorus.

## 2021-03-09 NOTE — ED Notes (Signed)
Spoke with phlebotomy; she will draw new BMP.
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
## 2021-03-09 NOTE — H&P (Addendum)
History and Physical    [name] [id number] DOB: 04-20-1966 DOA: 03/09/2021  PCP: Caprice Renshaw, MD   Patient coming from: [facility].  I have personally briefly reviewed patient's old medical records in [place]  Chief Complaint: Lower extremities wound.  HPI: [name] is a 55 y.o. female with medical history significant of alcohol use, ankle fracture, anxiety/panic attacks, left breast mass, history of resoled combined systolic and diastolic heart failure with normalizing LVEF on most recent echo last month, normocytic anemia, stage III CKD, history of cocaine abuse, COPD, type II DM, diabetic Charcot foot, diabetic peripheral neuropathy, hypertension, history of GI bleed, hyperlipidemia, history of treatment noncompliance, paroxysmal atrial fibrillation, seizures, sleep apnea not on CPAP, history of CVA, history of tobacco abuse who is coming to the emergency department via EMS from [place] after she called 911 because she stated she has not had a dressing change in her lower extremity wounds since 03/06/2021.  She also stated that she has not been eating or drinking much.  She has been very thirsty and urinating frequently.  She is taking prednisone 50 mg daily, but was unable to tell me why she is taking it.  She states on her wheelchair most of the time.  She denies fever, chills, sore throat, rhinorrhea, wheezing or hemoptysis.  No dyspnea, chest pain, palpitations, diaphoresis, PND, orthopnea, but she gets occasional lower extremity edema.  Denies abdominal pain, diarrhea, constipation, melena or hematochezia.  No dysuria, frequency or hematuria.  ED Course: Initial vital signs were temperature 99.4 F, pulse 102, respirations 20, BP 142/106 mmHg O2 sat 93% on room air.  The patient received 10 units of insulin IVP and 500 mL of NS bolus.  I added another 1000 mL of NS bolus.  Lab work: CBC showed a white count of 8.6 with 97% neutrophils,  hemoglobin 12.8 g/dL platelets 170.  CMP showed normal electrolytes.  Glucose 128, BUN 61 creatinine 2.14 mg/dL (previous measurements of creatinine in the last 3 months has been ranging from 1.30 to 1.69 mg/dL and GFR between 35 to 48 mL/min.Total protein was 6.0, albumin 2.7 g/dL.  The rest of the hepatic functions were unremarkable.  BNP was 87.0 pg/mL.  Imaging: A portable 1 view chest radiograph showed cardiomegaly without evidence of edema or airspace disease.  Please see images and full radiology report for further detail.  Review of Systems: As per HPI otherwise all other systems reviewed and are negative.  Past Medical History:  Diagnosis Date  . Alcohol use   . Ankle fracture, lateral malleolus, closed 2013  . Anxiety   . Breast mass, left 2013  . CHF (congestive heart failure) ([place])    a. EF 20-25% by echo in 05/2016 with cath showing normal cors b. EF 50-55% in 07/2020 c. 01/2021: EF at 55-60% with moderate LVH  . Chronic anemia   . CKD (chronic kidney disease), stage III ([place])   . Cocaine abuse ([place])   . COPD (chronic obstructive pulmonary disease) ([place])   . Diabetes mellitus, type 2 ([place])   . Diabetic Charcot foot ([place])   . Essential hypertension   . History of cardiomyopathy   . History of GI bleed 2011  . Hyperlipidemia   . Noncompliance   . Obesity   . PAF (paroxysmal atrial fibrillation) ([place])   . Panic attacks   . PAT (paroxysmal atrial tachycardia) (HCC)    Previously on Amiodarone  . Seizures ([place]) 10/01/2020  . Sleep  apnea    Not on CPAP  . Stroke ([place]) 2018  . Tobacco abuse   . Urinary incontinence     Past Surgical History:  Procedure Laterality Date  . BIOPSY  11/12/2020   Procedure: BIOPSY;  Surgeon: Harvel Quale, MD;  [facility];  Service: Gastroenterology;;  . BREAST BIOPSY    . CARDIAC CATHETERIZATION N/A 07/28/2016   Procedure: Left Heart Cath and Coronary Angiography;  Surgeon: Jettie Booze, MD;  Location: [facility];  Service: Cardiovascular;  Laterality: N/A;  . COLONOSCOPY N/A 05/10/2019   Procedure: COLONOSCOPY;  Surgeon: Danie Binder, MD;  [facility];  Service: Endoscopy;  Laterality: N/A;  Phenergan 12.5 mg IV in pre-op  . DILATION AND CURETTAGE OF UTERUS    . ESOPHAGOGASTRODUODENOSCOPY (EGD) WITH PROPOFOL N/A 11/12/2020   Procedure: ESOPHAGOGASTRODUODENOSCOPY (EGD) WITH PROPOFOL;  Surgeon: Harvel Quale, MD;  [facility];  Service: Gastroenterology;  Laterality: N/A;  . I & D EXTREMITY Bilateral 09/22/2017   Procedure: BILATERAL DEBRIDEMENT LEG/FOOT ULCERS, APPLY VERAFLO WOUND VAC;  Surgeon: Newt Minion, MD;  Location: [facility];  Service: Orthopedics;  Laterality: Bilateral;  . I & D EXTREMITY Right 10/11/2018   Procedure: IRRIGATION AND DEBRIDEMENT RIGHT HAND;  Surgeon: [name], MD;  Location: [facility];  Service: Orthopedics;  Laterality: Right;  . I & D EXTREMITY Right 10/13/2018   Procedure: REPEAT IRRIGATION AND DEBRIDEMENT RIGHT HAND;  Surgeon: [name], MD;  Location: [facility];  Service: Orthopedics;  Laterality: Right;  . I & D EXTREMITY Right 11/22/2018   Procedure: IRRIGATION AND DEBRIDEMENT AND PINNING RIGHT HAND;  Surgeon: [name], MD;  Location: [facility];  Service: Orthopedics;  Laterality: Right;  . IR RADIOLOGIST EVAL & MGMT  07/05/2018  . POLYPECTOMY  05/10/2019   Procedure: POLYPECTOMY;  Surgeon: Danie Binder, MD;  [facility];  Service: Endoscopy;;  . SKIN SPLIT GRAFT Bilateral 09/28/2017   Procedure: BILATERAL SPLIT THICKNESS SKIN GRAFT LEGS/FEET AND APPLY VAC;  Surgeon: Newt Minion, MD;  Location: [facility];  Service: Orthopedics;  Laterality: Bilateral;  . SKIN SPLIT GRAFT Right 11/22/2018   Procedure: SKIN GRAFT SPLIT THICKNESS;  Surgeon: [name], MD;  Location: [facility];  Service: Orthopedics;  Laterality: Right;    Social History  reports that she has been smoking cigarettes. She has a 5.00  pack-year smoking history. She has never used smokeless tobacco. She reports previous alcohol use. She reports previous drug use. Frequency: 3.00 times per week. Drugs: Marijuana and Cocaine.  No Known Allergies  Family History  Problem Relation Age of Onset  . Hypertension Mother   . Heart attack Mother   . Hypertension Father        CABG   . Hypertension Sister   . Hypertension Brother   . Hypertension Sister   . Cancer Sister        breast   . Arthritis Other   . Cancer Other   . Diabetes Other   . Asthma Other   . Hypertension Daughter   . Hypertension Son    Prior to Admission medications   Medication Sig Start Date End Date Taking? Authorizing Provider  acetaminophen (TYLENOL) 325 MG tablet Take 2 tablets (650 mg total) by mouth every 6 (six) hours as needed for headache, fever or mild pain. 11/04/20  Yes Johnson, Clanford L, MD  albuterol (PROAIR HFA) 108 (90 Base) MCG/ACT inhaler INHALE 2 PUFFS EVERY  6 HOURS AS NEEDED FOR SHORTNESS OF BREATH/WHEEZING. Patient taking differently: Inhale 2 puffs into the lungs every 6 (six) hours as needed for wheezing or shortness of breath. 08/05/20  Yes [name], MD  amLODipine (NORVASC) 10 MG tablet Take 1 tablet (10 mg total) by mouth daily. 12/07/20  Yes Emokpae, Courage, MD  apixaban (ELIQUIS) 5 MG TABS tablet Take 1 tablet (5 mg total) by mouth 2 (two) times daily. Okay to restart Eliquis on 11/17/2020 12/07/20  Yes Emokpae, Courage, MD  DULoxetine (CYMBALTA) 60 MG capsule Take 1 capsule (60 mg total) by mouth daily. 12/07/20  Yes Emokpae, Courage, MD  furosemide (LASIX) 40 MG tablet Take 1 tablet (40 mg total) by mouth daily. 03/07/21  Yes Strader, [place] M, PA-C  gabapentin (NEURONTIN) 300 MG capsule TAKE 1 CAPSULE BY MOUTH THREE TIMES A DAY. Patient taking differently: Take 300 mg by mouth 3 (three) times daily. 11/14/19  Yes Newt Minion, MD  insulin glargine (LANTUS) 100 UNIT/ML injection Inject 0.14 mLs (14 Units total)  into the skin at bedtime. Patient taking differently: Inject 25 Units into the skin 2 (two) times daily. 12/07/20  Yes Emokpae, Courage, MD  insulin lispro (HUMALOG) 100 UNIT/ML injection Inject into the skin 3 (three) times daily before meals. 151-200= 1 unit, 201-250= 2 units, 251-300= 4 units, 301-350= 6 units, 351-400=8 units, 401-500= 10  units   Yes [provider]  levETIRAcetam (KEPPRA) 750 MG tablet Take 1 tablet (750 mg total) by mouth 2 (two) times daily. 01/30/21  Yes Suzzanne Cloud, NP  linagliptin (TRADJENTA) 5 MG TABS tablet Take 5 mg by mouth daily.   Yes [provider]  LORazepam (ATIVAN) 0.5 MG tablet Take 0.5 mg by mouth every 8 (eight) hours as needed for anxiety.   Yes [provider]  nystatin (MYCOSTATIN) 100000 UNIT/ML suspension Take 5 mLs by mouth 4 (four) times daily. 03/05/21  Yes [provider]  oxybutynin (DITROPAN-XL) 5 MG 24 hr tablet Take 5 mg by mouth at bedtime.   Yes [provider]  oxyCODONE (OXY IR/ROXICODONE) 5 MG immediate release tablet Take 5 mg by mouth 3 (three) times daily as needed. 03/03/21  Yes [provider]  predniSONE (DELTASONE) 50 MG tablet Take 1 tablet (50 mg total) by mouth daily with breakfast. 12/08/20  Yes Emokpae, Courage, MD  rosuvastatin (CRESTOR) 10 MG tablet Take 1 tablet (10 mg total) by mouth daily. 12/07/20  Yes Emokpae, Courage, MD  tiotropium (SPIRIVA HANDIHALER) 18 MCG inhalation capsule Place 18 mcg into inhaler and inhale daily as needed (Shortness of breath).   Yes [provider]  traZODone (DESYREL) 50 MG tablet Take 1 tablet (50 mg total) by mouth at bedtime. 12/07/20  Yes Emokpae, Courage, MD  vitamin C (ASCORBIC ACID) 500 MG tablet Take 500 mg by mouth daily.   Yes [provider]  VITAMIN D, CHOLECALCIFEROL, PO Take 5,000 Units by mouth daily.   Yes [provider]  Zinc Oxide 10 % AERO Apply 1 application topically daily.   Yes [provider]  zinc sulfate 220 (50 Zn) MG capsule Take 1 capsule (220 mg total) by mouth daily. 12/08/20  Yes Roxan Hockey, MD  Calcium Carbonate Antacid (TUMS PO) Take 2 tablets by mouth every 8 (eight) hours as needed. Patient not taking: Reported on 03/09/2021    [provider]  guaiFENesin (MUCINEX) 600 MG 12 hr tablet Take 600 mg by mouth 2 (two) times daily. Patient not  taking: Reported on 03/09/2021    [provider]  Insulin Glargine-yfgn 100 UNIT/ML SOPN Inject into the skin. Patient not taking: Reported on 03/09/2021 01/23/21   [provider]  metoprolol succinate (TOPROL-XL) 100 MG 24 hr tablet Take 1 tablet (100 mg total) by mouth 2 (two) times daily. Take with or immediately following a meal. Patient not taking: Reported on 03/09/2021 12/07/20   Roxan Hockey, MD  montelukast (SINGULAIR) 10 MG tablet TAKE (1) TABLET BY MOUTH AT BEDTIME. Patient taking differently: Take 10 mg by mouth at bedtime. 07/19/20   [name], MD  omeprazole (PRILOSEC) 20 MG capsule Take 1 capsule by mouth 2 (two) times daily. Patient not taking: Reported on 03/09/2021 01/22/21   [provider]  pantoprazole (PROTONIX) 40 MG tablet Take 1 tablet (40 mg total) by mouth 2 (two) times daily before a meal. Patient not taking: Reported on 03/09/2021 01/01/21   Mahala Menghini, PA-C  polyethylene glycol (MIRALAX) 17 g packet Take 17 g by mouth daily as needed. Patient not taking: Reported on 03/09/2021 01/01/21   Mahala Menghini, PA-C  Potassium Chloride ER 20 MEQ TBCR Take 1 tablet by mouth daily. Patient not taking: Reported on 03/09/2021 02/28/21   [provider]  sitaGLIPtin (JANUVIA) 100 MG tablet Take 100 mg by mouth daily. Patient not taking: Reported on 03/09/2021    [provider]   Physical Exam: Vitals:   03/09/21 1600 03/09/21 1745 03/09/21 2033 03/09/21 2212  BP: (!) 138/107 (!) 126/107 (!) 139/97 (!) 165/107  Pulse: (!) 102 (!) 103 96 91   Resp: 11 12 18 20   Temp:    97.8 F (36.6 C)  TempSrc:    Oral  SpO2: 91% 91% 94% 95%  Weight:      Height:       Constitutional: Looks chronically ill.  Currently in NAD. Eyes: PERRL, lids and conjunctivae normal ENMT: Mucous membranes are dry. Posterior pharynx clear of any exudate or lesions. Neck: normal, supple, no masses, no thyromegaly Respiratory: clear to auscultation bilaterally, no wheezing, no crackles. Normal respiratory effort. No accessory muscle use.  Cardiovascular: Regular rate and rhythm, no murmurs / rubs / gallops. No extremity edema. 2+ pedal pulses. No carotid bruits.  Abdomen: Obese, no distention.  Bowel sounds positive.  Soft, no tenderness, no masses palpated. No hepatosplenomegaly. Musculoskeletal: Moderate generalized weakness.  No clubbing / cyanosis. Good ROM, no contractures. Normal muscle tone.  Skin: Decreased skin turgor on both feet.  Dressings in place in both pretibial areas without any discharge on lower extremities. Neurologic: CN 2-12 grossly intact. Sensation intact.  Moves all extremities. Psychiatric: Normal judgment and insight. Alert and oriented x 3. Normal mood.   Labs on Admission: I have personally reviewed following labs and imaging studies  CBC: Recent Labs  Lab 03/09/21 1735  WBC 8.6  NEUTROABS 8.3*  HGB 12.8  HCT 41.3  MCV 96.0  PLT 123XX123   Basic Metabolic Panel: Recent Labs  Lab 03/09/21 1735 03/09/21 2129  NA 135 133*  K 4.5 5.0  CL 99 98  CO2 26 26  GLUCOSE 520* 414*  BUN 61* 61*  CREATININE 2.14* 2.12*  CALCIUM 9.0 8.2*  MG  --  1.9  PHOS  --  4.8*   GFR: Estimated Creatinine Clearance: 30.6 mL/min (A) (by C-G formula based on SCr of 2.12 mg/dL (H)).  Liver Function Tests: Recent Labs  Lab 03/09/21 1735  AST 12*  ALT 24  ALKPHOS 81  BILITOT 0.7  PROT 6.0*  ALBUMIN 2.7*   Radiological Exams on Admission: DG Chest Port 1 View  Result Date: 03/09/2021 CLINICAL DATA:  Tachycardia EXAM: PORTABLE  CHEST 1 VIEW COMPARISON:  December 03, 2020 FINDINGS: The lungs are clear. There is cardiomegaly with pulmonary vascularity normal. No adenopathy. No bone lesions. IMPRESSION: Cardiomegaly.  No edema or airspace opacity. Electronically Signed   By: Lowella Grip III M.D.   On: 03/09/2021 17:39   EKG: Independently reviewed.  Vent. rate 98 BPM PR interval 116 ms QRS duration 87 ms QT/QTcB 405/518 ms P-R-T axes 29 -28 119 Sinus rhythm Atrial premature complex Sinus pause Borderline short PR interval Borderline left axis deviation  Assessment/Plan Principal Problem:   Diabetic hyperosmolar non-ketotic state ([place]) Improving after IV insulin/IVF Observation/telemetry. Continue IV fluids. Hold diuretic. Will continue to manage with SQ insulin. Continue Lantus 25 units SQ twice daily. CBG monitoring with RI SS.  Active Problems:   AKI (acute kidney injury) on CKD (chronic kidney disease) stage 3b Hold furosemide Continue IV fluids. Monitor intake and output. Follow renal function electrolytes.   Open wound of both lower extremities This is a chronic issue. No signs of active infection. Continue local care.    Essential hypertension Hold furosemide. Continue amlodipine 10 mg p.o. twice daily. Monitor BP, renal function electrolytes.    Diabetic neuropathy (HCC) Continue gabapentin 300 mg p.o. 3 times daily.  COPD (chronic obstructive pulmonary disease) ([place]) Supplemental oxygen as needed. Bronchodilators as needed.    PAF (paroxysmal atrial fibrillation) (HCC) CHA?DS?-VASc Score of at least 7. Continue apixaban 5 mg p.o. twice daily.    Seizures (HCC) Continue Keppra 750 mg p.o. twice daily.    Hyperlipidemia/Aortic atherosclerosis (HCC) Continue rosuvastatin 10 mg p.o. daily.    DVT prophylaxis: On apixaban. Code Status:   Full code. Family Communication:   Disposition Plan:   Patient is from:  SNF.  Anticipated DC to:  SNF.  Anticipated DC  date:  03/10/2021 or 03/11/2021.  Anticipated DC barriers: Clinical status.  Consults called: Admission status:  Observation/telemetry.   Severity of Illness: High severity due to presenting with AKI in the setting of dehydration secondary to hyperosmolar nonketotic state.  Reubin Milan MD [facility]  How to contact the [facility] Attending or Consulting provider [place] or covering provider during after hours [place], for this patient?   1. Check the care team in [facility] and look for a) attending/consulting [facility] provider listed and b) the [facility] team listed 2. Log into [email or web address] and use [place]'s universal password to access. If you do not have the password, please contact the hospital operator. 3. Locate the [facility] provider you are looking for under [facility] and page to a number that you can be directly reached. 4. If you still have difficulty reaching the provider, please page the [facility] (Director on Call) for the Hospitalists listed on amion for assistance.  03/09/2021, 10:42 PM   This document was prepared using Dragon voice management software may contain some unintended transcription errors.

## 2021-03-09 NOTE — ED Triage Notes (Addendum)
PT states she has chronic bilateral leg edema and requires bid ace bandage changes daily. Pt states the staff have not re-wrapped her legs in three days. Bandage to the legs is dated 5/12. Pt called 911 due to wanting her legs re-wrapped. PT is not independently ambulatory. Also reports poorly controlled IDDM chronically and this is confirmed via chart review. Brief saturated with urine. Removed, hygiene care performed.

## 2021-03-09 NOTE — H&P (Incomplete)
History and Physical    [name] [id number] DOB: [birth year] DOA: 03/09/2021  PCP: Caprice Renshaw, MD   Patient coming from: [facility].  I have personally briefly reviewed patient's old medical records in [place]  Chief Complaint: Lower extremities wound.  HPI: [name] is a 55 y.o. female with medical history significant of alcohol use, ankle fracture, anxiety/panic attacks, left breast mass, history of resoled combined systolic and diastolic heart failure with normalizing LVEF on most recent echo last month, normocytic anemia, stage III CKD, history of cocaine abuse, COPD, type II DM, diabetic Charcot foot, diabetic peripheral neuropathy, hypertension, history of GI bleed, hyperlipidemia, history of treatment noncompliance, paroxysmal atrial fibrillation, seizures, sleep apnea not on CPAP, history of CVA, history of tobacco abuse who is coming to the emergency department from  ED Course: Initial vital signs were temperature 99.4 F, pulse 102, respirations 20, BP 142/106 mmHg O2 sat 93% on room air.  The patient received 10 units of insulin IVP and 500 mL of NS bolus.  I added another 1000 mL of NS bolus.  Lab work: CBC showed a white count of 8.6 with 97% neutrophils, hemoglobin 12.8 g/dL platelets 170.  CMP showed normal electrolytes.  Glucose 128, BUN 61 creatinine 2.14 mg/dL (previous measurements of creatinine in the last 3 months has been ranging from 1.30 to 1.69 mg/dL and GFR between 35 to 48 mL/min.Total protein was 6.0, albumin 2.7 g/dL.  The rest of the hepatic functions were unremarkable.  BNP was 87.0 pg/mL.  Imaging: A portable 1 view chest radiograph showed cardiomegaly without evidence of edema or airspace disease.  Please see images and full radiology report for further detail.  Review of Systems: As per HPI otherwise all other systems reviewed and are negative.  Past Medical History:  Diagnosis Date  . Alcohol use   . Ankle fracture, lateral  malleolus, closed 2013  . Anxiety   . Breast mass, left 2013  . CHF (congestive heart failure) ([place])    a. EF 20-25% by echo in 05/2016 with cath showing normal cors b. EF 50-55% in 07/2020 c. 01/2021: EF at 55-60% with moderate LVH  . Chronic anemia   . CKD (chronic kidney disease), stage III ([place])   . Cocaine abuse ([place])   . COPD (chronic obstructive pulmonary disease) ([place])   . Diabetes mellitus, type 2 ([place])   . Diabetic Charcot foot ([place])   . Essential hypertension   . History of cardiomyopathy   . History of GI bleed 2011  . Hyperlipidemia   . Noncompliance   . Obesity   . PAF (paroxysmal atrial fibrillation) ([place])   . Panic attacks   . PAT (paroxysmal atrial tachycardia) (HCC)    Previously on Amiodarone  . Seizures ([place]) 10/01/2020  . Sleep apnea    Not on CPAP  . Stroke ([place]) 2018  . Tobacco abuse   . Urinary incontinence     Past Surgical History:  Procedure Laterality Date  . BIOPSY  11/12/2020   Procedure: BIOPSY;  Surgeon: Harvel Quale, MD;  [facility];  Service: Gastroenterology;;  . BREAST BIOPSY    . CARDIAC CATHETERIZATION N/A 07/28/2016   Procedure: Left Heart Cath and Coronary Angiography;  Surgeon: Jettie Booze, MD;  Location: [facility] CV LAB;  Service: Cardiovascular;  Laterality: N/A;  . COLONOSCOPY N/A 05/10/2019   Procedure: COLONOSCOPY;  Surgeon: Danie Binder, MD;  [facility];  Service: Endoscopy;  Laterality:  N/A;  Phenergan 12.5 mg IV in pre-op  . DILATION AND CURETTAGE OF UTERUS    . ESOPHAGOGASTRODUODENOSCOPY (EGD) WITH PROPOFOL N/A 11/12/2020   Procedure: ESOPHAGOGASTRODUODENOSCOPY (EGD) WITH PROPOFOL;  Surgeon: Harvel Quale, MD;  [facility];  Service: Gastroenterology;  Laterality: N/A;  . I & D EXTREMITY Bilateral 09/22/2017   Procedure: BILATERAL DEBRIDEMENT LEG/FOOT ULCERS, APPLY VERAFLO WOUND VAC;  Surgeon: Newt Minion, MD;  Location: [place];  Service: Orthopedics;   Laterality: Bilateral;  . I & D EXTREMITY Right 10/11/2018   Procedure: IRRIGATION AND DEBRIDEMENT RIGHT HAND;  Surgeon: [name], MD;  Location: [place];  Service: Orthopedics;  Laterality: Right;  . I & D EXTREMITY Right 10/13/2018   Procedure: REPEAT IRRIGATION AND DEBRIDEMENT RIGHT HAND;  Surgeon: [name], MD;  Location: [place];  Service: Orthopedics;  Laterality: Right;  . I & D EXTREMITY Right 11/22/2018   Procedure: IRRIGATION AND DEBRIDEMENT AND PINNING RIGHT HAND;  Surgeon: [name], MD;  Location: [place];  Service: Orthopedics;  Laterality: Right;  . IR RADIOLOGIST EVAL & MGMT  07/05/2018  . POLYPECTOMY  05/10/2019   Procedure: POLYPECTOMY;  Surgeon: Danie Binder, MD;  [facility];  Service: Endoscopy;;  . SKIN SPLIT GRAFT Bilateral 09/28/2017   Procedure: BILATERAL SPLIT THICKNESS SKIN GRAFT LEGS/FEET AND APPLY VAC;  Surgeon: Newt Minion, MD;  Location: [place];  Service: Orthopedics;  Laterality: Bilateral;  . SKIN SPLIT GRAFT Right 11/22/2018   Procedure: SKIN GRAFT SPLIT THICKNESS;  Surgeon: [name], MD;  Location: [facility];  Service: Orthopedics;  Laterality: Right;    Social History  reports that she has been smoking cigarettes. She has a 5.00 pack-year smoking history. She has never used smokeless tobacco. She reports previous alcohol use. She reports previous drug use. Frequency: 3.00 times per week. Drugs: Marijuana and Cocaine.  No Known Allergies  Family History  Problem Relation Age of Onset  . Hypertension Mother   . Heart attack Mother   . Hypertension Father        CABG   . Hypertension Sister   . Hypertension Brother   . Hypertension Sister   . Cancer Sister        breast   . Arthritis Other   . Cancer Other   . Diabetes Other   . Asthma Other   . Hypertension Daughter   . Hypertension Son    Prior to Admission medications   Medication Sig Start Date End Date Taking? Authorizing Provider  acetaminophen (TYLENOL) 325  MG tablet Take 2 tablets (650 mg total) by mouth every 6 (six) hours as needed for headache, fever or mild pain. 11/04/20  Yes Johnson, Clanford L, MD  albuterol (PROAIR HFA) 108 (90 Base) MCG/ACT inhaler INHALE 2 PUFFS EVERY 6 HOURS AS NEEDED FOR SHORTNESS OF BREATH/WHEEZING. Patient taking differently: Inhale 2 puffs into the lungs every 6 (six) hours as needed for wheezing or shortness of breath. 08/05/20  Yes [name], MD  amLODipine (NORVASC) 10 MG tablet Take 1 tablet (10 mg total) by mouth daily. 12/07/20  Yes Emokpae, Courage, MD  apixaban (ELIQUIS) 5 MG TABS tablet Take 1 tablet (5 mg total) by mouth 2 (two) times daily. Okay to restart Eliquis on 11/17/2020 12/07/20  Yes Emokpae, Courage, MD  DULoxetine (CYMBALTA) 60 MG capsule Take 1 capsule (60 mg total) by mouth daily. 12/07/20  Yes Emokpae, Courage, MD  furosemide (LASIX) 40 MG tablet Take 1 tablet (40  mg total) by mouth daily. 03/07/21  Yes Strader, [place] M, PA-C  gabapentin (NEURONTIN) 300 MG capsule TAKE 1 CAPSULE BY MOUTH THREE TIMES A DAY. Patient taking differently: Take 300 mg by mouth 3 (three) times daily. 11/14/19  Yes Newt Minion, MD  insulin glargine (LANTUS) 100 UNIT/ML injection Inject 0.14 mLs (14 Units total) into the skin at bedtime. Patient taking differently: Inject 25 Units into the skin 2 (two) times daily. 12/07/20  Yes Emokpae, Courage, MD  insulin lispro (HUMALOG) 100 UNIT/ML injection Inject into the skin 3 (three) times daily before meals. 151-200= 1 unit, 201-250= 2 units, 251-300= 4 units, 301-350= 6 units, 351-400=8 units, 401-500= 10  units   Yes [provider]  levETIRAcetam (KEPPRA) 750 MG tablet Take 1 tablet (750 mg total) by mouth 2 (two) times daily. 01/30/21  Yes Suzzanne Cloud, NP  linagliptin (TRADJENTA) 5 MG TABS tablet Take 5 mg by mouth daily.   Yes [provider]  LORazepam (ATIVAN) 0.5 MG tablet Take 0.5 mg by mouth every 8 (eight) hours as needed for anxiety.   Yes  [provider]  nystatin (MYCOSTATIN) 100000 UNIT/ML suspension Take 5 mLs by mouth 4 (four) times daily. 03/05/21  Yes [provider]  oxybutynin (DITROPAN-XL) 5 MG 24 hr tablet Take 5 mg by mouth at bedtime.   Yes [provider]  oxyCODONE (OXY IR/ROXICODONE) 5 MG immediate release tablet Take 5 mg by mouth 3 (three) times daily as needed. 03/03/21  Yes [provider]  predniSONE (DELTASONE) 50 MG tablet Take 1 tablet (50 mg total) by mouth daily with breakfast. 12/08/20  Yes Emokpae, Courage, MD  rosuvastatin (CRESTOR) 10 MG tablet Take 1 tablet (10 mg total) by mouth daily. 12/07/20  Yes Emokpae, Courage, MD  tiotropium (SPIRIVA HANDIHALER) 18 MCG inhalation capsule Place 18 mcg into inhaler and inhale daily as needed (Shortness of breath).   Yes [provider]  traZODone (DESYREL) 50 MG tablet Take 1 tablet (50 mg total) by mouth at bedtime. 12/07/20  Yes Emokpae, Courage, MD  vitamin C (ASCORBIC ACID) 500 MG tablet Take 500 mg by mouth daily.   Yes [provider]  VITAMIN D, CHOLECALCIFEROL, PO Take 5,000 Units by mouth daily.   Yes [provider]  Zinc Oxide 10 % AERO Apply 1 application topically daily.   Yes [provider]  zinc sulfate 220 (50 Zn) MG capsule Take 1 capsule (220 mg total) by mouth daily. 12/08/20  Yes Roxan Hockey, MD  Calcium Carbonate Antacid (TUMS PO) Take 2 tablets by mouth every 8 (eight) hours as needed. Patient not taking: Reported on 03/09/2021    [provider]  guaiFENesin (MUCINEX) 600 MG 12 hr tablet Take 600 mg by mouth 2 (two) times daily. Patient not taking: Reported on 03/09/2021    [provider]  Insulin Glargine-yfgn 100 UNIT/ML SOPN Inject into the skin. Patient not taking: Reported on 03/09/2021 01/23/21   [provider]  metoprolol succinate (TOPROL-XL) 100 MG 24 hr tablet Take 1 tablet (100 mg total) by mouth 2 (two) times daily. Take with or  immediately following a meal. Patient not taking: Reported on 03/09/2021 12/07/20   Roxan Hockey, MD  montelukast (SINGULAIR) 10 MG tablet TAKE (1) TABLET BY MOUTH AT BEDTIME. Patient taking differently: Take 10 mg by mouth at bedtime. 07/19/20   [name], MD  omeprazole (PRILOSEC) 20 MG capsule Take 1 capsule by mouth 2 (two) times daily. Patient not  taking: Reported on 03/09/2021 01/22/21   [provider]  pantoprazole (PROTONIX) 40 MG tablet Take 1 tablet (40 mg total) by mouth 2 (two) times daily before a meal. Patient not taking: Reported on 03/09/2021 01/01/21   Mahala Menghini, PA-C  polyethylene glycol (MIRALAX) 17 g packet Take 17 g by mouth daily as needed. Patient not taking: Reported on 03/09/2021 01/01/21   Mahala Menghini, PA-C  Potassium Chloride ER 20 MEQ TBCR Take 1 tablet by mouth daily. Patient not taking: Reported on 03/09/2021 02/28/21   [provider]  sitaGLIPtin (JANUVIA) 100 MG tablet Take 100 mg by mouth daily. Patient not taking: Reported on 03/09/2021    [provider]   Physical Exam: Vitals:   03/09/21 1600 03/09/21 1745 03/09/21 2033 03/09/21 2212  BP: (!) 138/107 (!) 126/107 (!) 139/97 (!) 165/107  Pulse: (!) 102 (!) 103 96 91  Resp: 11 12 18 20   Temp:    97.8 F (36.6 C)  TempSrc:    Oral  SpO2: 91% 91% 94% 95%  Weight:      Height:       Constitutional: Looks chronically ill.  Currently in NAD. Eyes: PERRL, lids and conjunctivae normal ENMT: Mucous membranes are dry. Posterior pharynx clear of any exudate or lesions. Neck: normal, supple, no masses, no thyromegaly Respiratory: clear to auscultation bilaterally, no wheezing, no crackles. Normal respiratory effort. No accessory muscle use.  Cardiovascular: Regular rate and rhythm, no murmurs / rubs / gallops. No extremity edema. 2+ pedal pulses. No carotid bruits.  Abdomen: no tenderness, no masses palpated. No hepatosplenomegaly. Bowel sounds positive.  Musculoskeletal:  no clubbing / cyanosis. Good ROM, no contractures. Normal muscle tone.  Skin: no rashes, lesions, ulcers  Neurologic: CN 2-12 grossly intact. Sensation intact, DTR normal. Strength 5/5 in all 4.  Psychiatric: Normal judgment and insight. Alert and oriented x 3. Normal mood.   Labs on Admission: I have personally reviewed following labs and imaging studies  CBC: Recent Labs  Lab 03/09/21 1735  WBC 8.6  NEUTROABS 8.3*  HGB 12.8  HCT 41.3  MCV 96.0  PLT [id number]   Basic Metabolic Panel: Recent Labs  Lab 03/09/21 1735 03/09/21 2129  NA 135 133*  K 4.5 5.0  CL 99 98  CO2 26 26  GLUCOSE 520* 414*  BUN 61* 61*  CREATININE 2.14* 2.12*  CALCIUM 9.0 8.2*  MG  --  1.9  PHOS  --  4.8*   GFR: Estimated Creatinine Clearance: 30.6 mL/min (A) (by C-G formula based on SCr of 2.12 mg/dL (H)).  Liver Function Tests: Recent Labs  Lab 03/09/21 1735  AST 12*  ALT 24  ALKPHOS 81  BILITOT 0.7  PROT 6.0*  ALBUMIN 2.7*   Radiological Exams on Admission: DG Chest Port 1 View  Result Date: 03/09/2021 CLINICAL DATA:  Tachycardia EXAM: PORTABLE CHEST 1 VIEW COMPARISON:  December 03, 2020 FINDINGS: The lungs are clear. There is cardiomegaly with pulmonary vascularity normal. No adenopathy. No bone lesions. IMPRESSION: Cardiomegaly.  No edema or airspace opacity. Electronically Signed   By: Lowella Grip III M.D.   On: 03/09/2021 17:39   EKG: Independently reviewed.  Vent. rate 98 BPM PR interval 116 ms QRS duration 87 ms QT/QTcB 405/518 ms P-R-T axes 29 -28 119 Sinus rhythm Atrial premature complex Sinus pause Borderline short PR interval Borderline left axis deviation  Assessment/Plan Principal Problem:   Diabetic hyperosmolar non-ketotic state ([place]) Active Problems:   Essential hypertension   HTN (  hypertension), malignant   Diabetic neuropathy (HCC)   CKD (chronic kidney disease) stage 3, GFR 30-59 ml/min (HCC)   COPD (chronic obstructive pulmonary disease) (HCC)   AKI  (acute kidney injury) (HCC)   PAF (paroxysmal atrial fibrillation) (HCC)   Seizures (HCC)   DKA (diabetic ketoacidosis) (HCC)     DVT prophylaxis: On apixaban. Code Status:   Full code. Family Communication:   Disposition Plan:   Patient is from:  SNF.  Anticipated DC to:  SNF.  Anticipated DC date:  03/10/2021 or 03/11/2021.  Anticipated DC barriers: Clinical status.  Consults called: Admission status:  Observation/telemetry.   Severity of Illness: High severity due to presenting with AKI in the setting of dehydration secondary to hyperosmolar nonketotic state.  Reubin Milan MD [facility]  How to contact the [facility] Attending or Consulting provider [place] or covering provider during after hours [place], for this patient?   1. Check the care team in [facility] and look for a) attending/consulting TRH provider listed and b) the [facility] team listed 2. Log into [email or web address] and use [place]'s universal password to access. If you do not have the password, please contact the hospital operator. 3. Locate the [facility] provider you are looking for under [facility] and page to a number that you can be directly reached. 4. If you still have difficulty reaching the provider, please page the [facility] (Director on Call) for the Hospitalists listed on amion for assistance.  03/09/2021, 10:42 PM   This document was prepared using Dragon voice management software may contain some unintended transcription errors.

## 2021-03-09 NOTE — ED Notes (Signed)
X-ray at bedside

## 2021-03-09 NOTE — ED Notes (Signed)
Pt states she is missing her cell phone and her blue jacket. Explained she did not come with a blue jacket or cell phone. Explained she has her glasses, dentures, necklace, bracelet, pants, top, necklace, small hand bag. Room and bed searched, staff have not located a jacket or cell phone.

## 2021-03-09 NOTE — ED Notes (Signed)
MD made aware of repeat CBG.

## 2021-03-09 NOTE — ED Notes (Signed)
RN at bedside attempting to place [name] IV.

## 2021-03-09 NOTE — ED Notes (Signed)
MD has assessed legs. Verbal order to redress and re-wrap.

## 2021-03-09 NOTE — ED Notes (Signed)
Luiz Iron, RN second nurse confirmed Novolog 10units.

## 2021-03-09 NOTE — ED Notes (Signed)
Phlebotomy at bedside.

## 2021-03-09 NOTE — ED Notes (Signed)
Receiving nurse will return call. Family has gone home at this time.

## 2021-03-09 NOTE — ED Notes (Signed)
Left medial ankle open pressure wound. Right lateral ankle with open pressure wound surrounded by dry flaking skin. Fluid pocket noted to right medial ankle, not draining.

## 2021-03-09 NOTE — ED Notes (Signed)
Delay in labs and IV medications due to lack of IV. This nurse attempted x2. Second nurse attempted x2. Pt will not allow her hands to be used. Spoke with CN to attempt IV.

## 2021-03-09 NOTE — ED Notes (Addendum)
Pt has multiple open pressure sores to bilateral labial folds. Pt states she has had these for a while. She does not know about any healing process.

## 2021-03-09 NOTE — ED Notes (Signed)
Put pt on purewick

## 2021-03-09 NOTE — ED Notes (Signed)
Merry Proud, RN attempted [name] IV x2, unsuccessful. Labs obtained and walked to chemistry.

## 2021-03-10 ENCOUNTER — Encounter (HOSPITAL_COMMUNITY): Payer: Self-pay | Admitting: Internal Medicine

## 2021-03-10 DIAGNOSIS — G4730 Sleep apnea, unspecified: Secondary | ICD-10-CM | POA: Diagnosis present

## 2021-03-10 DIAGNOSIS — Z7984 Long term (current) use of oral hypoglycemic drugs: Secondary | ICD-10-CM | POA: Diagnosis not present

## 2021-03-10 DIAGNOSIS — I472 Ventricular tachycardia: Secondary | ICD-10-CM | POA: Diagnosis not present

## 2021-03-10 DIAGNOSIS — J440 Chronic obstructive pulmonary disease with acute lower respiratory infection: Secondary | ICD-10-CM | POA: Diagnosis not present

## 2021-03-10 DIAGNOSIS — L304 Erythema intertrigo: Secondary | ICD-10-CM | POA: Diagnosis not present

## 2021-03-10 DIAGNOSIS — Z9114 Patient's other noncompliance with medication regimen: Secondary | ICD-10-CM | POA: Diagnosis not present

## 2021-03-10 DIAGNOSIS — K219 Gastro-esophageal reflux disease without esophagitis: Secondary | ICD-10-CM | POA: Diagnosis present

## 2021-03-10 DIAGNOSIS — I4891 Unspecified atrial fibrillation: Secondary | ICD-10-CM | POA: Diagnosis not present

## 2021-03-10 DIAGNOSIS — I6782 Cerebral ischemia: Secondary | ICD-10-CM | POA: Diagnosis not present

## 2021-03-10 DIAGNOSIS — E1349 Other specified diabetes mellitus with other diabetic neurological complication: Secondary | ICD-10-CM | POA: Diagnosis not present

## 2021-03-10 DIAGNOSIS — S81801A Unspecified open wound, right lower leg, initial encounter: Secondary | ICD-10-CM

## 2021-03-10 DIAGNOSIS — E785 Hyperlipidemia, unspecified: Secondary | ICD-10-CM | POA: Diagnosis present

## 2021-03-10 DIAGNOSIS — N179 Acute kidney failure, unspecified: Secondary | ICD-10-CM | POA: Diagnosis not present

## 2021-03-10 DIAGNOSIS — Z7901 Long term (current) use of anticoagulants: Secondary | ICD-10-CM | POA: Diagnosis not present

## 2021-03-10 DIAGNOSIS — I10 Essential (primary) hypertension: Secondary | ICD-10-CM | POA: Diagnosis not present

## 2021-03-10 DIAGNOSIS — E1100 Type 2 diabetes mellitus with hyperosmolarity without nonketotic hyperglycemic-hyperosmolar coma (NKHHC): Secondary | ICD-10-CM | POA: Diagnosis not present

## 2021-03-10 DIAGNOSIS — I130 Hypertensive heart and chronic kidney disease with heart failure and stage 1 through stage 4 chronic kidney disease, or unspecified chronic kidney disease: Secondary | ICD-10-CM | POA: Diagnosis not present

## 2021-03-10 DIAGNOSIS — R0689 Other abnormalities of breathing: Secondary | ICD-10-CM | POA: Diagnosis not present

## 2021-03-10 DIAGNOSIS — R569 Unspecified convulsions: Secondary | ICD-10-CM

## 2021-03-10 DIAGNOSIS — R5381 Other malaise: Secondary | ICD-10-CM | POA: Diagnosis not present

## 2021-03-10 DIAGNOSIS — L97919 Non-pressure chronic ulcer of unspecified part of right lower leg with unspecified severity: Secondary | ICD-10-CM | POA: Diagnosis not present

## 2021-03-10 DIAGNOSIS — F17210 Nicotine dependence, cigarettes, uncomplicated: Secondary | ICD-10-CM | POA: Diagnosis present

## 2021-03-10 DIAGNOSIS — R0902 Hypoxemia: Secondary | ICD-10-CM | POA: Diagnosis not present

## 2021-03-10 DIAGNOSIS — E11649 Type 2 diabetes mellitus with hypoglycemia without coma: Secondary | ICD-10-CM | POA: Diagnosis not present

## 2021-03-10 DIAGNOSIS — R531 Weakness: Secondary | ICD-10-CM | POA: Diagnosis not present

## 2021-03-10 DIAGNOSIS — Z743 Need for continuous supervision: Secondary | ICD-10-CM | POA: Diagnosis not present

## 2021-03-10 DIAGNOSIS — L97929 Non-pressure chronic ulcer of unspecified part of left lower leg with unspecified severity: Secondary | ICD-10-CM | POA: Diagnosis not present

## 2021-03-10 DIAGNOSIS — R739 Hyperglycemia, unspecified: Secondary | ICD-10-CM | POA: Diagnosis not present

## 2021-03-10 DIAGNOSIS — N1832 Chronic kidney disease, stage 3b: Secondary | ICD-10-CM | POA: Diagnosis not present

## 2021-03-10 DIAGNOSIS — G894 Chronic pain syndrome: Secondary | ICD-10-CM | POA: Diagnosis present

## 2021-03-10 DIAGNOSIS — I480 Paroxysmal atrial fibrillation: Secondary | ICD-10-CM

## 2021-03-10 DIAGNOSIS — Z803 Family history of malignant neoplasm of breast: Secondary | ICD-10-CM | POA: Diagnosis not present

## 2021-03-10 DIAGNOSIS — Z7401 Bed confinement status: Secondary | ICD-10-CM | POA: Diagnosis not present

## 2021-03-10 DIAGNOSIS — B370 Candidal stomatitis: Secondary | ICD-10-CM | POA: Diagnosis not present

## 2021-03-10 DIAGNOSIS — R404 Transient alteration of awareness: Secondary | ICD-10-CM | POA: Diagnosis not present

## 2021-03-10 DIAGNOSIS — R52 Pain, unspecified: Secondary | ICD-10-CM | POA: Diagnosis not present

## 2021-03-10 DIAGNOSIS — S81802A Unspecified open wound, left lower leg, initial encounter: Secondary | ICD-10-CM

## 2021-03-10 DIAGNOSIS — Z20822 Contact with and (suspected) exposure to covid-19: Secondary | ICD-10-CM | POA: Diagnosis not present

## 2021-03-10 DIAGNOSIS — G9341 Metabolic encephalopathy: Secondary | ICD-10-CM | POA: Diagnosis not present

## 2021-03-10 DIAGNOSIS — J449 Chronic obstructive pulmonary disease, unspecified: Secondary | ICD-10-CM | POA: Diagnosis present

## 2021-03-10 DIAGNOSIS — Z8249 Family history of ischemic heart disease and other diseases of the circulatory system: Secondary | ICD-10-CM | POA: Diagnosis not present

## 2021-03-10 DIAGNOSIS — I5042 Chronic combined systolic (congestive) and diastolic (congestive) heart failure: Secondary | ICD-10-CM | POA: Diagnosis not present

## 2021-03-10 DIAGNOSIS — J3489 Other specified disorders of nose and nasal sinuses: Secondary | ICD-10-CM | POA: Diagnosis not present

## 2021-03-10 DIAGNOSIS — I6529 Occlusion and stenosis of unspecified carotid artery: Secondary | ICD-10-CM | POA: Diagnosis not present

## 2021-03-10 DIAGNOSIS — I429 Cardiomyopathy, unspecified: Secondary | ICD-10-CM | POA: Diagnosis not present

## 2021-03-10 DIAGNOSIS — I700 Atherosclerosis of aorta: Secondary | ICD-10-CM

## 2021-03-10 DIAGNOSIS — I6381 Other cerebral infarction due to occlusion or stenosis of small artery: Secondary | ICD-10-CM | POA: Diagnosis not present

## 2021-03-10 HISTORY — DX: Atherosclerosis of aorta: I70.0

## 2021-03-10 HISTORY — DX: Candidal stomatitis: B37.0

## 2021-03-10 LAB — CBC
HCT: 40.5 % (ref 36.0–46.0)
Hemoglobin: 12.6 g/dL (ref 12.0–15.0)
MCH: 29.6 pg (ref 26.0–34.0)
MCHC: 31.1 g/dL (ref 30.0–36.0)
MCV: 95.1 fL (ref 80.0–100.0)
Platelets: 180 10*3/uL (ref 150–400)
RBC: 4.26 MIL/uL (ref 3.87–5.11)
RDW: 15.5 % (ref 11.5–15.5)
WBC: 8 10*3/uL (ref 4.0–10.5)
nRBC: 0 % (ref 0.0–0.2)

## 2021-03-10 LAB — COMPREHENSIVE METABOLIC PANEL
ALT: 21 U/L (ref 0–44)
AST: 13 U/L — ABNORMAL LOW (ref 15–41)
Albumin: 2.5 g/dL — ABNORMAL LOW (ref 3.5–5.0)
Alkaline Phosphatase: 69 U/L (ref 38–126)
Anion gap: 8 (ref 5–15)
BUN: 62 mg/dL — ABNORMAL HIGH (ref 6–20)
CO2: 29 mmol/L (ref 22–32)
Calcium: 8.5 mg/dL — ABNORMAL LOW (ref 8.9–10.3)
Chloride: 100 mmol/L (ref 98–111)
Creatinine, Ser: 2 mg/dL — ABNORMAL HIGH (ref 0.44–1.00)
GFR, Estimated: 29 mL/min — ABNORMAL LOW (ref >60)
Glucose, Bld: 273 mg/dL — ABNORMAL HIGH (ref 70–99)
Potassium: 5.2 mmol/L — ABNORMAL HIGH (ref 3.5–5.1)
Sodium: 137 mmol/L (ref 135–145)
Total Bilirubin: 0.7 mg/dL (ref 0.3–1.2)
Total Protein: 5.8 g/dL — ABNORMAL LOW (ref 6.5–8.1)

## 2021-03-10 LAB — GLUCOSE, CAPILLARY
Glucose-Capillary: 247 mg/dL — ABNORMAL HIGH (ref 70–99)
Glucose-Capillary: 228 mg/dL — ABNORMAL HIGH (ref 70–99)
Glucose-Capillary: 75 mg/dL (ref 70–99)

## 2021-03-10 MED ORDER — NYSTATIN 100000 UNIT/ML MT SUSP
5.0000 mL | Freq: Four times a day (QID) | OROMUCOSAL | Status: DC
  Administered 2021-03-10 – 2021-03-11 (×5): 500000 [IU] via OROMUCOSAL
  Filled 2021-03-10 (×4): qty 5

## 2021-03-10 MED ORDER — INSULIN ASPART 100 UNIT/ML IJ SOLN
14.0000 [IU] | Freq: Three times a day (TID) | INTRAMUSCULAR | Status: DC
  Administered 2021-03-10 (×2): 14 [IU] via SUBCUTANEOUS

## 2021-03-10 MED ORDER — METOPROLOL TARTRATE 25 MG PO TABS
25.0000 mg | ORAL_TABLET | Freq: Two times a day (BID) | ORAL | Status: DC
  Administered 2021-03-10 – 2021-03-11 (×3): 25 mg via ORAL
  Filled 2021-03-10 (×3): qty 1

## 2021-03-10 MED ORDER — NICOTINE 21 MG/24HR TD PT24
21.0000 mg | MEDICATED_PATCH | Freq: Every day | TRANSDERMAL | Status: DC
  Administered 2021-03-10: 21 mg via TRANSDERMAL
  Filled 2021-03-10 (×2): qty 1

## 2021-03-10 MED ORDER — KETOCONAZOLE 2 % EX CREA
TOPICAL_CREAM | Freq: Two times a day (BID) | CUTANEOUS | Status: DC
  Filled 2021-03-10: qty 15

## 2021-03-10 MED ORDER — FLUCONAZOLE 150 MG PO TABS
150.0000 mg | ORAL_TABLET | Freq: Every day | ORAL | Status: AC
  Administered 2021-03-10 – 2021-03-11 (×2): 150 mg via ORAL
  Filled 2021-03-10 (×2): qty 1

## 2021-03-10 MED ORDER — COLLAGENASE 250 UNIT/GM EX OINT
TOPICAL_OINTMENT | Freq: Every day | CUTANEOUS | Status: DC
  Filled 2021-03-10: qty 30

## 2021-03-10 MED ORDER — SODIUM ZIRCONIUM CYCLOSILICATE 10 G PO PACK
10.0000 g | PACK | Freq: Three times a day (TID) | ORAL | Status: AC
  Administered 2021-03-10 (×3): 10 g via ORAL
  Filled 2021-03-10 (×3): qty 1

## 2021-03-10 MED ORDER — NYSTATIN 100000 UNIT/GM EX POWD
Freq: Two times a day (BID) | CUTANEOUS | Status: DC
  Filled 2021-03-10: qty 15
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
## 2021-03-10 NOTE — Consult Note (Signed)
I have placed a request via Secure Chat to Dr. Dorthula Rue  requesting photos of the wound areas of concern to be placed in the EMR.  Will need telehealth consultation, no WOC available to travel to this campus today.  [place], [place], [place]
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
## 2021-03-10 NOTE — Progress Notes (Signed)
Patient had nine beat run of v-tach.  Patient asleep and not in any distress. MD notified while on floor.  No new orders received.

## 2021-03-10 NOTE — Progress Notes (Signed)
Patient arrived to floor with MASD to groin and perineal area.  Mild breakdown to labial folds.  Patient has multiple stage 2 to buttocks.  Patient also has stage 2 to right lower leg and a stage 3 to left lower leg.

## 2021-03-10 NOTE — Progress Notes (Signed)
Made a visit to see [name] today. Received a referral from Nursing that patient was interested in receiving information about an Advanced Care Directive. When I asked [name] about this she denied the need for information and stated that she did not wish to complete a HCPOA/Living Will. Chaplain sat bedside and provided spiritual support and prayer. Will remain available in order to provide spiritual support and to assess for spiritual need.

## 2021-03-10 NOTE — Progress Notes (Addendum)
PROGRESS NOTE    [name]  UMP:[id number] DOB: 12/03/1965 DOA: 03/09/2021 PCP: Caprice Renshaw, MD    Chief Complaint  Patient presents with  . Leg Pain    Brief Narrative:  [name] is a 55 y.o. female with medical history significant of alcohol use, ankle fracture, anxiety/panic attacks, left breast mass, history of resoled combined systolic and diastolic heart failure with normalizing LVEF on most recent echo last month, normocytic anemia, stage III CKD, history of cocaine abuse, COPD, type II DM, diabetic Charcot foot, diabetic peripheral neuropathy, hypertension, history of GI bleed, hyperlipidemia, history of treatment noncompliance, paroxysmal atrial fibrillation, seizures, sleep apnea not on CPAP, history of CVA, history of tobacco abuse who came to the emergency department via EMS from [facility] facility after she called 911 because she stated she has not had a dressing change in her lower extremity wounds since 03/06/2021.  She also stated that she has not been eating or drinking much.  She has been very thirsty and urinating frequently.  She is taking prednisone 50 mg daily, but was unable to tell me why she is taking it.  She states on her wheelchair most of the time. In ED, pt was hypertensive to 142/106 and slightly tachycardic to 102, and O2 sats were 93% on RA but pt denied SOB. Cr elevated to 2.0 and Glucose elevated to 273 but other labs WNL. Hyperglycemia was treated with insulin IVP and 500 ml iv bolus NS. CXR was significant for cardiomegaly but negative for airspace disease or other concerns (full impression below). Pt was admitted for hyperosmolar hyperglycemia with associated dehydration, AKI in setting of CKD, and evaluation of lower extremity wounds.   Assessment & Plan:   Principal Problem:   Diabetic hyperosmolar non-ketotic state ([place]) Active Problems:   Essential hypertension   Diabetic neuropathy (HCC)   CKD (chronic kidney disease) stage 3, GFR  30-59 ml/min (HCC)   COPD (chronic obstructive pulmonary disease) (HCC)   AKI (acute kidney injury) (HCC)   PAF (paroxysmal atrial fibrillation) (HCC)   Seizures (HCC)   Aortic atherosclerosis (HCC)   Open wound of both lower extremities   Oral candidiasis   Intertrigo  AKI (acute kidney injury) on CKD (chronic kidney disease) stage 3b - Continue to Hold furosemide - Continue IV fluids. - Monitor intake and output. - Cr 2.0 on admission, continue to follow with AM CMPs   Open wound of both lower extremities, Moisture-Associated Skin Damage of perianal and groin - is a chronic issue - No signs of active infection. - Started PO fluconazole 150 mg daily x 2 - Started nystatin 61mL 4 times daily for 5d - Started collagenase cream directly to L ankle wound - Started topical ketoconazole cream and nystatin powder for MASD - Wound Care telehealth visit 5/16, appreciate recs:       - Cover leg wounds with dry dressings and kerlix       - Use barrier cream in perianal and groin   Essential hypertension - Continue to hold furosemide. - Continue amlodipine 10 mg p.o. twice daily - BP elevated to 171/113 o/n 5/15-5/16 - Started PO metoprolol 25 mg bid on 5/16 - Continue to monitor BP   Hyperkalemia - lokelma 10 g TID ordered - recheck in AM    Hyperglycemia and peripheral neuropathy in setting of DM2 - Novolog 14U SQ tid w meals - Lantus 25U SQ bid - Continue gabapentin 300 mg p.o. 3 times daily for peripheral neuropathy  COPD (chronic obstructive pulmonary disease) (HCC) - Pt not reporting SOB - Supplemental oxygen as needed. SpO2 currently 99% - Bronchodilators as needed.   PAF (paroxysmal atrial fibrillation) (HCC) - CHA2DS2VASc Score of at least 7. - Continue PO apixaban 5 mg bid - Overnight 5/16 pt had 9-beat run of V-Tach while sleeping. Pt denies SOB or chest pain this AM. - 5/16 AM EKG significant for prolonged QTc - Continue to monitor  Seizures (HCC) - Continue  Keppra 750 mg p.o. twice daily.   Hyperlipidemia/Aortic atherosclerosis (HCC) - Continue rosuvastatin 10 mg p.o. daily.  ToC - SW consulted 5/16   DVT prophylaxis: Apixiban Code Status: FULL  Family Communication:   Disposition:   Status is: Observation/Telemetry  The patient will require care spanning > 2 midnights and should be moved to inpatient because: Inpatient level of care appropriate due to severity of illness  Dispo: The patient is from: SNF [facility]              Anticipated d/c is to: SNF              Patient currently is not medically stable to d/c.   Difficult to place patient No    Consultants:   Wound Care  ToC  Spiritual Care  Procedures:   None  Antimicrobials:   Topical creams and azoles per A&P    Subjective: Pt this AM 5/16 feeling much better than on admission. Still complaining of pain of BLE. Pt denies SOB, chest pain, fevers, chills, palpitations, or other concerns. Pt states she no longer feels thirsty and urinary frequency since stopping her diuretics.   Objective: Vitals:   03/10/21 0148 03/10/21 0621 03/10/21 0816 03/10/21 1129  BP: 121/67 (!) 142/97 (!) 171/113 (!) 153/104  Pulse: 86 94 (!) 102 95  Resp: 20 18    Temp: 97.6 F (36.4 C) 98.2 F (36.8 C)    TempSrc: Oral Oral    SpO2: 100% 99%    Weight:      Height:        Intake/Output Summary (Last 24 hours) at 03/10/2021 1212 Last data filed at 03/10/2021 0855 Gross per 24 hour  Intake 2378.62 ml  Output 2800 ml  Net -421.38 ml   Filed Weights   03/09/21 1503 03/09/21 1508  Weight: 96.6 kg 92.1 kg    Examination:  General exam: Looks chronically ill but currently in NAD. Pt AAOx4 and somewhat temperamental Oral Exam: whitish plaques seen in mouth appears to be thrush.  Respiratory system: Clear to auscultation. Respiratory effort normal. Cardiovascular system: S1 & S2 heard, RRR. No JVD, murmurs, rubs, gallops or clicks. Significant BLE  edema. Gastrointestinal system: Pt is obese, abdomen is nondistended, soft and nontender. No organomegaly or masses felt. Normal bowel sounds heard. Central nervous system: Alert and oriented. No focal neurological deficits. Extremities: Moderate weakness of all extremities but pt spontaneously moving all limbs. Skin: Dressings over BLE shins intact. Wound without discharge or significant erythema.  Psychiatry: Judgement and insight appear poor. Mood & affect appropriate though frustrated throughout examination.   Data Reviewed: I have personally reviewed following labs and imaging studies  CBC: Recent Labs  Lab 03/09/21 1735 03/10/21 0530  WBC 8.6 8.0  NEUTROABS 8.3*  --   HGB 12.8 12.6  HCT 41.3 40.5  MCV 96.0 95.1  PLT 170 329    Basic Metabolic Panel: Recent Labs  Lab 03/09/21 1735 03/09/21 2129 03/10/21 0530  NA 135 133* 137  K 4.5 5.0  5.2*  CL 99 98 100  CO2 26 26 29   GLUCOSE 520* 414* 273*  BUN 61* 61* 62*  CREATININE 2.14* 2.12* 2.00*  CALCIUM 9.0 8.2* 8.5*  MG  --  1.9  --   PHOS  --  4.8*  --     GFR: Estimated Creatinine Clearance: 32.5 mL/min (A) (by C-G formula based on SCr of 2 mg/dL (H)).  Liver Function Tests: Recent Labs  Lab 03/09/21 1735 03/10/21 0530  AST 12* 13*  ALT 24 21  ALKPHOS 81 69  BILITOT 0.7 0.7  PROT 6.0* 5.8*  ALBUMIN 2.7* 2.5*    CBG: Recent Labs  Lab 03/09/21 1810 03/09/21 2018 03/09/21 2206 03/10/21 0711 03/10/21 1111  GLUCAP 391* 397* 357* 247* 228*     Recent Results (from the past 240 hour(s))  Resp Panel by RT-PCR (Flu A&B, Covid) Nasopharyngeal Swab     Status: None   Collection Time: 03/09/21  7:14 PM   Specimen: Nasopharyngeal Swab; Nasopharyngeal(NP) swabs in vial transport medium  Result Value Ref Range Status   SARS Coronavirus 2 by RT PCR NEGATIVE NEGATIVE Final    Comment: (NOTE) SARS-CoV-2 target nucleic acids are NOT DETECTED.  The SARS-CoV-2 RNA is generally detectable in upper  respiratory specimens during the acute phase of infection. The lowest concentration of SARS-CoV-2 viral copies this assay can detect is 138 copies/mL. A negative result does not preclude SARS-Cov-2 infection and should not be used as the sole basis for treatment or other patient management decisions. A negative result may occur with  improper specimen collection/handling, submission of specimen other than nasopharyngeal swab, presence of viral mutation(s) within the areas targeted by this assay, and inadequate number of viral copies(<138 copies/mL). A negative result must be combined with clinical observations, patient history, and epidemiological information. The expected result is Negative.  Fact Sheet for Patients:  EntrepreneurPulse.com.au  Fact Sheet for Healthcare Providers:  IncredibleEmployment.be  This test is no t yet approved or cleared by the [place] FDA and  has been authorized for detection and/or diagnosis of SARS-CoV-2 by FDA under an Emergency Use Authorization (EUA). This EUA will remain  in effect (meaning this test can be used) for the duration of the COVID-19 declaration under Section 564(b)(1) of the Act, 21 U.S.C.section 360bbb-3(b)(1), unless the authorization is terminated  or revoked sooner.       Influenza A by PCR NEGATIVE NEGATIVE Final   Influenza B by PCR NEGATIVE NEGATIVE Final    Comment: (NOTE) The Xpert Xpress SARS-CoV-2/FLU/RSV plus assay is intended as an aid in the diagnosis of influenza from Nasopharyngeal swab specimens and should not be used as a sole basis for treatment. Nasal washings and aspirates are unacceptable for Xpert Xpress SARS-CoV-2/FLU/RSV testing.  Fact Sheet for Patients: EntrepreneurPulse.com.au  Fact Sheet for Healthcare Providers: IncredibleEmployment.be  This test is not yet approved or cleared by the [place] FDA and has been  authorized for detection and/or diagnosis of SARS-CoV-2 by FDA under an Emergency Use Authorization (EUA). This EUA will remain in effect (meaning this test can be used) for the duration of the COVID-19 declaration under Section 564(b)(1) of the Act, 21 U.S.C. section 360bbb-3(b)(1), unless the authorization is terminated or revoked.  Performed at [facility], [address] [place], [place] [address]     Radiology Studies: [facility] 1 View  Result Date: 03/09/2021 CLINICAL DATA:  Tachycardia EXAM: PORTABLE CHEST 1 VIEW COMPARISON:  December 03, 2020 FINDINGS: The lungs are clear. There  is cardiomegaly with pulmonary vascularity normal. No adenopathy. No bone lesions. IMPRESSION: Cardiomegaly.  No edema or airspace opacity. Electronically Signed   By: Lowella Grip III M.D.   On: 03/09/2021 17:39   Scheduled Meds: . amLODipine  10 mg Oral Daily  . apixaban  5 mg Oral BID  . vitamin C  500 mg Oral Daily  . collagenase   Topical Daily  . DULoxetine  60 mg Oral Daily  . fluconazole  150 mg Oral Daily  . gabapentin  300 mg Oral TID  . insulin aspart  14 Units Subcutaneous TID WC  . insulin glargine  25 Units Subcutaneous BID  . ketoconazole   Topical BID  . levETIRAcetam  750 mg Oral BID  . linagliptin  5 mg Oral Daily  . metoprolol tartrate  25 mg Oral BID  . montelukast  10 mg Oral QHS  . nystatin  5 mL Mouth/Throat QID  . nystatin   Topical BID  . oxybutynin  5 mg Oral QHS  . pantoprazole  40 mg Oral BID AC  . rosuvastatin  10 mg Oral Daily  . sodium zirconium cyclosilicate  10 g Oral TID  . traZODone  50 mg Oral QHS  . umeclidinium bromide  1 puff Inhalation Daily  . zinc oxide  1 application Topical Daily  . zinc sulfate  220 mg Oral Daily   Continuous Infusions: . sodium chloride 100 mL/hr at 03/10/21 0747     LOS: 0 days   Time spent: 35 min  Modesta Messing, MS4 [facility]   To contact the attending provider between 7A-7P or the covering  provider during after hours 7P-7A, please log into the web site [email or web address] and access using universal [place] password for that web site. If you do not have the password, please call the hospital operator.  03/10/2021, 12:12 PM   ______________________________________________________ ATTENDING NOTE   Patient seen and examined with Modesta Messing, Medical student. In addition to supervising the encounter, I played a key role in the decision making process as well as reviewed key findings.  Pt having some tachycardia and arrhythmias.  I have restarted metoprolol 25 mg BID and will titrate as able.  Treating severe intertrigo and thrush.    Also patient volume depleted and has an AKI.  Treating with IV fluids.  Uncontrolled diabetes mellitus also being addressed, added 14 unit TIDAC prandial coverage with novolog.  Please see orders.    Murvin Natal MD [facility]  How to contact the [facility] Attending or Consulting provider [place] or covering provider during after hours [place], for this patient?  1. Check the care team in [facility] and look for a) attending/consulting TRH provider listed and b) the [facility] team listed 2. Log into [email or web address] and use [place]'s universal password to access. If you do not have the password, please contact the hospital operator. 3. Locate the [facility] provider you are looking for under [facility] and page to a number that you can be directly reached. 4. If you still have difficulty reaching the provider, please page the [facility] (Director on Call) for the Hospitalists listed on amion for assistance.

## 2021-03-10 NOTE — Consult Note (Signed)
Consultation completed by use of remote telehealth camera cart and with assistance from bedside nurse/clinical staff  Reason for Consult: bilateral pressure injuries on ankles Wound type: THESE ARE NOT PRESSURE INJURIES 1. Left medial ankle; venous ulceration; full thickness 2. Right medial ankle and foot; intact serous filled blister 3. Right lateral ankle; partial thickness venous ulcerations  Pressure Injury POA: NA Measurement: see nursing flowsheet  Wound bed:  Left medial ankle; 20% soft brown slough/80% pink early granulation Right medial ankle; intact blisters Right lateral ankle; 100% pink Drainage (amount, consistency, odor) minimal Periwound: edema  Dressing procedure/placement/frequency: Enzymatic debridement to the right medial; top with saline; dry dressing; kerlix Non adherent; single layer over intact blisters; and open ulcerations.  Cover with dry dressings and kerlix. Buttock wounds with barrier cream which is appropriate.   Discussed POC with patient and bedside nurse.  Re consult if needed, will not follow at this time. Thanks  Keiffer Piper [employer], RN,CWOCN, CNS, [place] [phone])

## 2021-03-11 ENCOUNTER — Emergency Department (HOSPITAL_COMMUNITY): Payer: Medicare Other

## 2021-03-11 ENCOUNTER — Encounter (HOSPITAL_COMMUNITY): Payer: Self-pay | Admitting: *Deleted

## 2021-03-11 ENCOUNTER — Inpatient Hospital Stay (HOSPITAL_COMMUNITY)
Admission: EM | Admit: 2021-03-11 | Discharge: 2021-03-13 | Disposition: A | Discharge status: Skilled Nursing Facility | Payer: Medicare Other | Source: Home / Self Care | Attending: Family Medicine | Admitting: Family Medicine

## 2021-03-11 ENCOUNTER — Other Ambulatory Visit: Payer: Self-pay

## 2021-03-11 DIAGNOSIS — J9601 Acute respiratory failure with hypoxia: Secondary | ICD-10-CM

## 2021-03-11 DIAGNOSIS — R4182 Altered mental status, unspecified: Secondary | ICD-10-CM

## 2021-03-11 DIAGNOSIS — G9341 Metabolic encephalopathy: Secondary | ICD-10-CM

## 2021-03-11 DIAGNOSIS — Z91199 Patient's noncompliance with other medical treatment and regimen due to unspecified reason: Secondary | ICD-10-CM

## 2021-03-11 DIAGNOSIS — Z9119 Patient's noncompliance with other medical treatment and regimen: Secondary | ICD-10-CM

## 2021-03-11 DIAGNOSIS — N1832 Chronic kidney disease, stage 3b: Secondary | ICD-10-CM | POA: Diagnosis present

## 2021-03-11 DIAGNOSIS — N183 Chronic kidney disease, stage 3 unspecified: Secondary | ICD-10-CM | POA: Diagnosis present

## 2021-03-11 DIAGNOSIS — E1110 Type 2 diabetes mellitus with ketoacidosis without coma: Secondary | ICD-10-CM

## 2021-03-11 DIAGNOSIS — F1910 Other psychoactive substance abuse, uncomplicated: Secondary | ICD-10-CM | POA: Diagnosis present

## 2021-03-11 DIAGNOSIS — L304 Erythema intertrigo: Secondary | ICD-10-CM

## 2021-03-11 DIAGNOSIS — F333 Major depressive disorder, recurrent, severe with psychotic symptoms: Secondary | ICD-10-CM | POA: Diagnosis present

## 2021-03-11 DIAGNOSIS — J440 Chronic obstructive pulmonary disease with acute lower respiratory infection: Secondary | ICD-10-CM

## 2021-03-11 DIAGNOSIS — E1100 Type 2 diabetes mellitus with hyperosmolarity without nonketotic hyperglycemic-hyperosmolar coma (NKHHC): Secondary | ICD-10-CM

## 2021-03-11 DIAGNOSIS — I5042 Chronic combined systolic (congestive) and diastolic (congestive) heart failure: Secondary | ICD-10-CM | POA: Diagnosis present

## 2021-03-11 DIAGNOSIS — N179 Acute kidney failure, unspecified: Secondary | ICD-10-CM | POA: Diagnosis present

## 2021-03-11 DIAGNOSIS — I4891 Unspecified atrial fibrillation: Secondary | ICD-10-CM | POA: Diagnosis present

## 2021-03-11 DIAGNOSIS — E162 Hypoglycemia, unspecified: Secondary | ICD-10-CM

## 2021-03-11 DIAGNOSIS — F332 Major depressive disorder, recurrent severe without psychotic features: Secondary | ICD-10-CM | POA: Diagnosis present

## 2021-03-11 HISTORY — DX: Metabolic encephalopathy: G93.41

## 2021-03-11 LAB — GLUCOSE, CAPILLARY
Glucose-Capillary: 106 mg/dL — ABNORMAL HIGH (ref 70–99)
Glucose-Capillary: 62 mg/dL — ABNORMAL LOW (ref 70–99)

## 2021-03-11 LAB — BASIC METABOLIC PANEL
Anion gap: 13 (ref 5–15)
BUN: 60 mg/dL — ABNORMAL HIGH (ref 6–20)
CO2: 25 mmol/L (ref 22–32)
Calcium: 8.8 mg/dL — ABNORMAL LOW (ref 8.9–10.3)
Chloride: 103 mmol/L (ref 98–111)
Creatinine, Ser: 2.2 mg/dL — ABNORMAL HIGH (ref 0.44–1.00)
GFR, Estimated: 26 mL/min — ABNORMAL LOW (ref >60)
Glucose, Bld: 101 mg/dL — ABNORMAL HIGH (ref 70–99)
Potassium: 5.1 mmol/L (ref 3.5–5.1)
Sodium: 141 mmol/L (ref 135–145)

## 2021-03-11 LAB — BLOOD GAS, ARTERIAL
Acid-Base Excess: 3 mmol/L — ABNORMAL HIGH (ref 0.0–2.0)
Bicarbonate: 26.6 mmol/L (ref 20.0–28.0)
FIO2: 28
O2 Saturation: 94 %
Patient temperature: 36.6
pCO2 arterial: 47.1 mmHg (ref 32.0–48.0)
pH, Arterial: 7.385 (ref 7.350–7.450)
pO2, Arterial: 78.7 mmHg — ABNORMAL LOW (ref 83.0–108.0)

## 2021-03-11 LAB — RESP PANEL BY RT-PCR (FLU A&B, COVID) ARPGX2
Influenza A by PCR: NEGATIVE
Influenza B by PCR: NEGATIVE
SARS Coronavirus 2 by RT PCR: NEGATIVE

## 2021-03-11 LAB — CBG MONITORING, ED
Glucose-Capillary: 89 mg/dL (ref 70–99)
Glucose-Capillary: 77 mg/dL (ref 70–99)
Glucose-Capillary: 77 mg/dL (ref 70–99)
Glucose-Capillary: 111 mg/dL — ABNORMAL HIGH (ref 70–99)
Glucose-Capillary: 107 mg/dL — ABNORMAL HIGH (ref 70–99)
Glucose-Capillary: 44 mg/dL — CL (ref 70–99)
Glucose-Capillary: 92 mg/dL (ref 70–99)

## 2021-03-11 LAB — COMPREHENSIVE METABOLIC PANEL
ALT: 19 U/L (ref 0–44)
AST: 14 U/L — ABNORMAL LOW (ref 15–41)
Albumin: 2.5 g/dL — ABNORMAL LOW (ref 3.5–5.0)
Alkaline Phosphatase: 59 U/L (ref 38–126)
Anion gap: 9 (ref 5–15)
BUN: 61 mg/dL — ABNORMAL HIGH (ref 6–20)
CO2: 27 mmol/L (ref 22–32)
Calcium: 8.5 mg/dL — ABNORMAL LOW (ref 8.9–10.3)
Chloride: 103 mmol/L (ref 98–111)
Creatinine, Ser: 2.13 mg/dL — ABNORMAL HIGH (ref 0.44–1.00)
GFR, Estimated: 27 mL/min — ABNORMAL LOW (ref >60)
Glucose, Bld: 80 mg/dL (ref 70–99)
Potassium: 4.1 mmol/L (ref 3.5–5.1)
Sodium: 139 mmol/L (ref 135–145)
Total Bilirubin: 0.6 mg/dL (ref 0.3–1.2)
Total Protein: 5.7 g/dL — ABNORMAL LOW (ref 6.5–8.1)

## 2021-03-11 LAB — CBC WITH DIFFERENTIAL/PLATELET
Abs Immature Granulocytes: 0.03 10*3/uL (ref 0.00–0.07)
Basophils Absolute: 0 10*3/uL (ref 0.0–0.1)
Basophils Relative: 0 %
Eosinophils Absolute: 0.1 10*3/uL (ref 0.0–0.5)
Eosinophils Relative: 1 %
HCT: 39.7 % (ref 36.0–46.0)
Hemoglobin: 12.1 g/dL (ref 12.0–15.0)
Immature Granulocytes: 0 %
Lymphocytes Relative: 10 %
Lymphs Abs: 1 10*3/uL (ref 0.7–4.0)
MCH: 29.5 pg (ref 26.0–34.0)
MCHC: 30.5 g/dL (ref 30.0–36.0)
MCV: 96.8 fL (ref 80.0–100.0)
Monocytes Absolute: 0.4 10*3/uL (ref 0.1–1.0)
Monocytes Relative: 4 %
Neutro Abs: 8.3 10*3/uL — ABNORMAL HIGH (ref 1.7–7.7)
Neutrophils Relative %: 85 %
Platelets: 184 10*3/uL (ref 150–400)
RBC: 4.1 MIL/uL (ref 3.87–5.11)
RDW: 16.1 % — ABNORMAL HIGH (ref 11.5–15.5)
WBC: 9.7 10*3/uL (ref 4.0–10.5)
nRBC: 0.2 % (ref 0.0–0.2)

## 2021-03-11 MED ORDER — ONDANSETRON HCL 4 MG PO TABS: 4.0000 mg | ORAL_TABLET | Freq: Four times a day (QID) | ORAL | Status: DC | PRN

## 2021-03-11 MED ORDER — APIXABAN 5 MG PO TABS
5.0000 mg | ORAL_TABLET | Freq: Two times a day (BID) | ORAL | Status: DC
  Administered 2021-03-12 – 2021-03-13 (×4): 5 mg via ORAL
  Filled 2021-03-11 (×4): qty 1

## 2021-03-11 MED ORDER — NALOXONE HCL 2 MG/2ML IJ SOSY
1.0000 mg | PREFILLED_SYRINGE | Freq: Once | INTRAMUSCULAR | Status: DC
  Filled 2021-03-11: qty 2

## 2021-03-11 MED ORDER — NALOXONE HCL 2 MG/2ML IJ SOSY
1.0000 mg | PREFILLED_SYRINGE | Freq: Once | INTRAMUSCULAR | Status: AC
  Administered 2021-03-11: 1 mg via INTRAVENOUS

## 2021-03-11 MED ORDER — METOPROLOL TARTRATE 25 MG PO TABS
25.0000 mg | ORAL_TABLET | Freq: Two times a day (BID) | ORAL | Status: DC
  Administered 2021-03-12 – 2021-03-13 (×4): 25 mg via ORAL
  Filled 2021-03-11 (×4): qty 1

## 2021-03-11 MED ORDER — MONTELUKAST SODIUM 10 MG PO TABS
10.0000 mg | ORAL_TABLET | Freq: Every day | ORAL | Status: DC
  Administered 2021-03-12 (×2): 10 mg via ORAL
  Filled 2021-03-11 (×2): qty 1

## 2021-03-11 MED ORDER — NALOXONE HCL 2 MG/2ML IJ SOSY
PREFILLED_SYRINGE | INTRAMUSCULAR | Status: AC
  Filled 2021-03-11: qty 4

## 2021-03-11 MED ORDER — NALOXONE HCL 0.4 MG/ML IJ SOLN
INTRAMUSCULAR | Status: AC
  Administered 2021-03-11: 0.4 mg via INTRAVENOUS
  Filled 2021-03-11: qty 1

## 2021-03-11 MED ORDER — UMECLIDINIUM BROMIDE 62.5 MCG/INH IN AEPB: 1.0000 | INHALATION_SPRAY | Freq: Every day | RESPIRATORY_TRACT | Status: DC | PRN

## 2021-03-11 MED ORDER — OXYCODONE HCL 5 MG PO TABS: 5.0000 mg | ORAL_TABLET | Freq: Three times a day (TID) | ORAL | 0 refills | Status: DC | PRN

## 2021-03-11 MED ORDER — NALOXONE HCL 0.4 MG/ML IJ SOLN: 0.4000 mg | Freq: Once | INTRAMUSCULAR | Status: AC

## 2021-03-11 MED ORDER — LEVETIRACETAM 500 MG PO TABS
750.0000 mg | ORAL_TABLET | Freq: Two times a day (BID) | ORAL | Status: DC
  Administered 2021-03-12 – 2021-03-13 (×4): 750 mg via ORAL
  Filled 2021-03-11 (×4): qty 1

## 2021-03-11 MED ORDER — AMLODIPINE BESYLATE 5 MG PO TABS
10.0000 mg | ORAL_TABLET | Freq: Every day | ORAL | Status: DC
  Administered 2021-03-12 – 2021-03-13 (×2): 10 mg via ORAL
  Filled 2021-03-11 (×2): qty 2

## 2021-03-11 MED ORDER — FUROSEMIDE 40 MG PO TABS: 20.0000 mg | ORAL_TABLET | ORAL | 6 refills | Status: DC

## 2021-03-11 MED ORDER — ONDANSETRON HCL 4 MG/2ML IJ SOLN: 4.0000 mg | Freq: Four times a day (QID) | INTRAMUSCULAR | Status: DC | PRN

## 2021-03-11 MED ORDER — DEXTROSE 50 % IV SOLN
INTRAVENOUS | Status: AC
  Administered 2021-03-11: 25 mL via INTRAVENOUS
  Filled 2021-03-11: qty 50

## 2021-03-11 MED ORDER — ACETAMINOPHEN 650 MG RE SUPP: 650.0000 mg | Freq: Four times a day (QID) | RECTAL | Status: DC | PRN

## 2021-03-11 MED ORDER — ROSUVASTATIN CALCIUM 10 MG PO TABS
10.0000 mg | ORAL_TABLET | Freq: Every day | ORAL | Status: DC
  Administered 2021-03-12 – 2021-03-13 (×2): 10 mg via ORAL
  Filled 2021-03-11 (×2): qty 1

## 2021-03-11 MED ORDER — OXYBUTYNIN CHLORIDE ER 5 MG PO TB24
5.0000 mg | ORAL_TABLET | Freq: Every day | ORAL | Status: DC
  Administered 2021-03-12 (×2): 5 mg via ORAL
  Filled 2021-03-11 (×2): qty 1

## 2021-03-11 MED ORDER — ALBUTEROL SULFATE HFA 108 (90 BASE) MCG/ACT IN AERS: 2.0000 | INHALATION_SPRAY | Freq: Four times a day (QID) | RESPIRATORY_TRACT | Status: DC | PRN

## 2021-03-11 MED ORDER — DEXTROSE 50 % IV SOLN
50.0000 mL | Freq: Once | INTRAVENOUS | Status: AC
  Administered 2021-03-11: 50 mL via INTRAVENOUS
  Filled 2021-03-11: qty 50

## 2021-03-11 MED ORDER — KETOCONAZOLE 2 % EX CREA: TOPICAL_CREAM | Freq: Two times a day (BID) | CUTANEOUS | 0 refills | Status: AC

## 2021-03-11 MED ORDER — LORAZEPAM 0.5 MG PO TABS: 0.5000 mg | ORAL_TABLET | Freq: Three times a day (TID) | ORAL | 0 refills | Status: DC | PRN

## 2021-03-11 MED ORDER — TIOTROPIUM BROMIDE MONOHYDRATE 18 MCG IN CAPS: 18.0000 ug | ORAL_CAPSULE | Freq: Every day | RESPIRATORY_TRACT | Status: DC | PRN

## 2021-03-11 MED ORDER — DULOXETINE HCL 60 MG PO CPEP
60.0000 mg | ORAL_CAPSULE | Freq: Every day | ORAL | Status: DC
  Administered 2021-03-12 – 2021-03-13 (×2): 60 mg via ORAL
  Filled 2021-03-11 (×2): qty 1

## 2021-03-11 MED ORDER — INSULIN GLARGINE 100 UNIT/ML ~~LOC~~ SOLN: 25.0000 [IU] | Freq: Two times a day (BID) | SUBCUTANEOUS | Status: DC

## 2021-03-11 MED ORDER — NYSTATIN 100000 UNIT/ML MT SUSP
5.0000 mL | Freq: Four times a day (QID) | OROMUCOSAL | Status: DC
  Administered 2021-03-12 – 2021-03-13 (×5): 500000 [IU] via ORAL
  Filled 2021-03-11 (×4): qty 5

## 2021-03-11 MED ORDER — POLYETHYLENE GLYCOL 3350 17 G PO PACK: 17.0000 g | PACK | Freq: Every day | ORAL | Status: DC | PRN

## 2021-03-11 MED ORDER — SODIUM CHLORIDE 0.9 % IV BOLUS
1000.0000 mL | Freq: Once | INTRAVENOUS | Status: AC
  Administered 2021-03-11: 1000 mL via INTRAVENOUS

## 2021-03-11 MED ORDER — DEXTROSE 10 % IV SOLN: INTRAVENOUS | Status: DC

## 2021-03-11 MED ORDER — ACETAMINOPHEN 325 MG PO TABS
650.0000 mg | ORAL_TABLET | Freq: Four times a day (QID) | ORAL | Status: DC | PRN
  Administered 2021-03-12 – 2021-03-13 (×3): 650 mg via ORAL
  Filled 2021-03-11 (×3): qty 2

## 2021-03-11 MED ORDER — PANTOPRAZOLE SODIUM 40 MG PO TBEC
40.0000 mg | DELAYED_RELEASE_TABLET | Freq: Every day | ORAL | Status: DC
  Administered 2021-03-12 – 2021-03-13 (×2): 40 mg via ORAL
  Filled 2021-03-11 (×2): qty 1

## 2021-03-11 MED ORDER — DEXTROSE 50 % IV SOLN: 25.0000 mL | Freq: Once | INTRAVENOUS | Status: AC

## 2021-03-11 MED ORDER — METOPROLOL TARTRATE 25 MG PO TABS: 25.0000 mg | ORAL_TABLET | Freq: Two times a day (BID) | ORAL | Status: DC

## 2021-03-11 MED ORDER — NALOXONE HCL 4 MG/10ML IJ SOLN
0.2500 mg/h | INTRAVENOUS | Status: DC
  Administered 2021-03-11: 0.25 mg/h via INTRAVENOUS
  Filled 2021-03-11: qty 4
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
## 2021-03-11 NOTE — ED Triage Notes (Addendum)
Per [employer] staff, pt was discharged from [facility] at approximately 1215. Upon arrival to facility pt received her 1230 dose of oxycodone and pt went outside during supervised break time to smoke. Upon returning from break, pt was "not acting right" per staff. cbg and vss wnl.

## 2021-03-11 NOTE — ED Notes (Signed)
Narcan given. Pt responded.

## 2021-03-11 NOTE — ED Notes (Signed)
Pt lying in bed at this time in no distress. Pt A/Ox4. Pt making conversation. CBG rechecked, results 111.

## 2021-03-11 NOTE — H&P (Addendum)
History and Physical    [name] [id number] DOB: 01/07/1966 DOA: 03/11/2021  PCP: Caprice Renshaw, MD   Patient coming from: [name]  I have personally briefly reviewed patient's old medical records in [place]  Chief Complaint: AMS  HPI: [name] is a 55 y.o. female with medical history significant for systolic and diastolic CHF, COPD, diabetes mellitus, atrial fibrillation, seizures, polysubstance abuse, CKD 3, hypertension. Patient was brought to the ED reports of altered mental status.   Patient was just discharged from the hospital today at about noon after hospitalization 5/15-5/17 for acute on chronic kidney disease treated with IV fluids, and open bilateral lateral lower extremity wounds without signs of active infection treated with oral fluconazole and nystatin, also hyperglycemia blood glucose of 520 on admission. Per nursing home, patient went out to smoke, came in with a change in mental status.  Patient had received her dose of oxycodone, Ativan was given, also 25u of lantus.  En route patient's mental status continued to decline.  ED Course: Temperature 97.8.  Heart rate 80s.  Respiratory rate 11-13.  Blood pressure systolic [id number] to [id number].  O2 sats 96% on room air. Patient was obtunded on arrival, responding only to sternal rub and fall back asleep, 0.4 mg Narcan given with improvement in mental status, patient opened her eyes responded to voice and was able to move her extremities on command.  Blood sugars in the 80s, she also received 68mls D50. Later mental status declined again, blood glucose was checked and was 44.  1 amp of D50 given.  With improvement in mental status.  Review of Systems: As per HPI all other systems reviewed and negative.  Past Medical History:  Diagnosis Date  . Alcohol use   . Ankle fracture, lateral malleolus, closed 2013  . Anxiety   . Aortic atherosclerosis ([place]) 03/10/2021  . Breast mass, left 2013  . CHF (congestive  heart failure) ([place])    a. EF 20-25% by echo in 05/2016 with cath showing normal cors b. EF 50-55% in 07/2020 c. 01/2021: EF at 55-60% with moderate LVH  . Chronic anemia   . CKD (chronic kidney disease), stage III ([place])   . Cocaine abuse ([place])   . COPD (chronic obstructive pulmonary disease) ([place])   . Diabetes mellitus, type 2 ([place])   . Diabetic Charcot foot ([place])   . Essential hypertension   . History of cardiomyopathy   . History of GI bleed 2011  . Hyperlipidemia   . Noncompliance   . Obesity   . PAF (paroxysmal atrial fibrillation) ([place])   . Panic attacks   . PAT (paroxysmal atrial tachycardia) (H[facility])    Previously on Amiodarone  . Seizures ([place]) 10/01/2020  . Sleep apnea    Not on CPAP  . Stroke ([place]) 2018  . Tobacco abuse   . Urinary incontinence     Past Surgical History:  Procedure Laterality Date  . BIOPSY  11/12/2020   Procedure: BIOPSY;  Surgeon: Harvel Quale, MD;  [facility];  Service: Gastroenterology;;  . BREAST BIOPSY    . CARDIAC CATHETERIZATION N/A 07/28/2016   Procedure: Left Heart Cath and Coronary Angiography;  Surgeon: Jettie Booze, MD;  Location: [facility];  Service: Cardiovascular;  Laterality: N/A;  . COLONOSCOPY N/A 05/10/2019   Procedure: COLONOSCOPY;  Surgeon: Danie Binder, MD;  [facility];  Service: Endoscopy;  Laterality: N/A;  Phenergan 12.5 mg IV in pre-op  .  DILATION AND CURETTAGE OF UTERUS    . ESOPHAGOGASTRODUODENOSCOPY (EGD) WITH PROPOFOL N/A 11/12/2020   Procedure: ESOPHAGOGASTRODUODENOSCOPY (EGD) WITH PROPOFOL;  Surgeon: Harvel Quale, MD;  [facility];  Service: Gastroenterology;  Laterality: N/A;  . I & D EXTREMITY Bilateral 09/22/2017   Procedure: BILATERAL DEBRIDEMENT LEG/FOOT ULCERS, APPLY VERAFLO WOUND VAC;  Surgeon: Newt Minion, MD;  Location: [facility];  Service: Orthopedics;  Laterality: Bilateral;  . I & D EXTREMITY Right 10/11/2018   Procedure:  IRRIGATION AND DEBRIDEMENT RIGHT HAND;  Surgeon: [name], MD;  Location: [facility];  Service: Orthopedics;  Laterality: Right;  . I & D EXTREMITY Right 10/13/2018   Procedure: REPEAT IRRIGATION AND DEBRIDEMENT RIGHT HAND;  Surgeon: [name], MD;  Location: [facility];  Service: Orthopedics;  Laterality: Right;  . I & D EXTREMITY Right 11/22/2018   Procedure: IRRIGATION AND DEBRIDEMENT AND PINNING RIGHT HAND;  Surgeon: [name], MD;  Location: [facility];  Service: Orthopedics;  Laterality: Right;  . IR RADIOLOGIST EVAL & MGMT  07/05/2018  . POLYPECTOMY  05/10/2019   Procedure: POLYPECTOMY;  Surgeon: Danie Binder, MD;  [facility];  Service: Endoscopy;;  . SKIN SPLIT GRAFT Bilateral 09/28/2017   Procedure: BILATERAL SPLIT THICKNESS SKIN GRAFT LEGS/FEET AND APPLY VAC;  Surgeon: Newt Minion, MD;  Location: [facility];  Service: Orthopedics;  Laterality: Bilateral;  . SKIN SPLIT GRAFT Right 11/22/2018   Procedure: SKIN GRAFT SPLIT THICKNESS;  Surgeon: [name], MD;  Location: [facility];  Service: Orthopedics;  Laterality: Right;     reports that she has been smoking cigarettes. She has a 5.00 pack-year smoking history. She has never used smokeless tobacco. She reports previous alcohol use. She reports previous drug use. Frequency: 3.00 times per week. Drugs: Marijuana and Cocaine.  No Known Allergies  Family History  Problem Relation Age of Onset  . Hypertension Mother   . Heart attack Mother   . Hypertension Father        CABG   . Hypertension Sister   . Hypertension Brother   . Hypertension Sister   . Cancer Sister        breast   . Arthritis Other   . Cancer Other   . Diabetes Other   . Asthma Other   . Hypertension Daughter   . Hypertension Son     Prior to Admission medications   Medication Sig Start Date End Date Taking? Authorizing Provider  acetaminophen (TYLENOL) 325 MG tablet Take 2 tablets (650 mg total) by mouth every 6 (six) hours as needed for  headache, fever or mild pain. 11/04/20  Yes Johnson, Clanford L, MD  albuterol (PROAIR HFA) 108 (90 Base) MCG/ACT inhaler INHALE 2 PUFFS EVERY 6 HOURS AS NEEDED FOR SHORTNESS OF BREATH/WHEEZING. Patient taking differently: Inhale 2 puffs into the lungs every 6 (six) hours as needed for wheezing or shortness of breath. 08/05/20  Yes [name], MD  amLODipine (NORVASC) 10 MG tablet Take 1 tablet (10 mg total) by mouth daily. 12/07/20  Yes Paisyn Guercio, Courage, MD  apixaban (ELIQUIS) 5 MG TABS tablet Take 1 tablet (5 mg total) by mouth 2 (two) times daily. Okay to restart Eliquis on 11/17/2020 12/07/20  Yes Noreene Boreman, Courage, MD  DULoxetine (CYMBALTA) 60 MG capsule Take 1 capsule (60 mg total) by mouth daily. 12/07/20  Yes Nehal Shives, Courage, MD  furosemide (LASIX) 40 MG tablet Take 0.5 tablets (20 mg total) by mouth every other day. Patient taking differently: Take  20 mg by mouth daily. 03/18/21  Yes Johnson, Clanford L, MD  gabapentin (NEURONTIN) 300 MG capsule TAKE 1 CAPSULE BY MOUTH THREE TIMES A DAY. Patient taking differently: Take 300 mg by mouth 3 (three) times daily. 11/14/19  Yes Newt Minion, MD  insulin glargine (LANTUS) 100 UNIT/ML injection Inject 0.25 mLs (25 Units total) into the skin 2 (two) times daily. 03/11/21  Yes Johnson, Clanford L, MD  insulin lispro (HUMALOG) 100 UNIT/ML injection Inject into the skin 3 (three) times daily before meals. 151-200= 1 unit, 201-250= 2 units, 251-300= 4 units, 301-350= 6 units, 351-400=8 units, 401-500= 10  units   Yes [provider]  ketoconazole (NIZORAL) 2 % cream Apply topically 2 (two) times daily for 7 days. Apply in cheeks and perineal area with yeast rash. 03/11/21 03/18/21 Yes Johnson, Clanford L, MD  levETIRAcetam (KEPPRA) 750 MG tablet Take 1 tablet (750 mg total) by mouth 2 (two) times daily. 01/30/21  Yes Suzzanne Cloud, NP  linagliptin (TRADJENTA) 5 MG TABS tablet Take 5 mg by mouth daily.   Yes [provider]  LORazepam  (ATIVAN) 0.5 MG tablet Take 1 tablet (0.5 mg total) by mouth every 8 (eight) hours as needed for anxiety. 03/11/21  Yes Johnson, Clanford L, MD  metoprolol tartrate (LOPRESSOR) 25 MG tablet Take 1 tablet (25 mg total) by mouth 2 (two) times daily. 03/11/21  Yes Johnson, Clanford L, MD  montelukast (SINGULAIR) 10 MG tablet TAKE (1) TABLET BY MOUTH AT BEDTIME. Patient taking differently: Take 10 mg by mouth at bedtime. 07/19/20  Yes [name], MD  nystatin (MYCOSTATIN) 100000 UNIT/ML suspension Take 5 mLs by mouth 4 (four) times daily. 03/05/21  Yes [provider]  omeprazole (PRILOSEC) 20 MG capsule Take 20 mg by mouth daily.   Yes [provider]  oxybutynin (DITROPAN-XL) 5 MG 24 hr tablet Take 5 mg by mouth at bedtime.   Yes [provider]  oxyCODONE (OXY IR/ROXICODONE) 5 MG immediate release tablet Take 1 tablet (5 mg total) by mouth 3 (three) times daily as needed for severe pain. 03/11/21  Yes Johnson, Clanford L, MD  potassium chloride SA (KLOR-CON) 20 MEQ tablet Take 20 mEq by mouth daily.   Yes [provider]  predniSONE (DELTASONE) 50 MG tablet Take 50 mg by mouth daily with breakfast.   Yes [provider]  rosuvastatin (CRESTOR) 10 MG tablet Take 1 tablet (10 mg total) by mouth daily. 12/07/20  Yes Arlinda Barcelona, Courage, MD  tiotropium (SPIRIVA HANDIHALER) 18 MCG inhalation capsule Place 18 mcg into inhaler and inhale daily as needed (Shortness of breath).   Yes [provider]  traZODone (DESYREL) 50 MG tablet Take 1 tablet (50 mg total) by mouth at bedtime. 12/07/20  Yes Glynn Freas, Courage, MD  TRULICITY 3 0000000 SOPN Inject 3 mg into the skin See admin instructions. Inject 0.5 mls subcutaneously one time a day every Friday 03/11/21  Yes [provider]  vitamin C (ASCORBIC ACID) 500 MG tablet Take 500 mg by mouth daily.   Yes [provider]  VITAMIN D, CHOLECALCIFEROL, PO Take 5,000 Units by mouth daily.   Yes  [provider]  Zinc Oxide 10 % AERO Apply 1 application topically daily.   Yes [provider]  zinc sulfate 220 (50 Zn) MG capsule Take 1 capsule (220 mg total) by mouth daily. 12/08/20  Yes Roxan Hockey, MD    Physical Exam: Vitals:   03/11/21 1730 03/11/21 1800 03/11/21 1830  03/11/21 1930  BP: (!) 135/96 (!) 130/93 (!) 142/101 (!) 129/98  Pulse: 80 81 83 87  Resp: 12 11 12 12   Temp:      SpO2: 100% 98% 99% 96%  Weight:      Height:        Constitutional: Patient initially somnolent, arouses to touch but goes back to sleep, 1 mg Narcan given at bedside with significant improvement in respiratory rate form 10 > 18, mental status, patient became awake and alert but speech appears quite slurred, as documented earlier by ED provider.  Vitals:   03/11/21 1730 03/11/21 1800 03/11/21 1830 03/11/21 1930  BP: (!) 135/96 (!) 130/93 (!) 142/101 (!) 129/98  Pulse: 80 81 83 87  Resp: 12 11 12 12   Temp:      SpO2: 100% 98% 99% 96%  Weight:      Height:       Eyes: PERRL, lids and conjunctivae normal ENMT: Mucous membranes are  moist Neck: normal, supple, no masses, no thyromegaly Respiratory: clear to auscultation bilaterally, no wheezing, no crackles.  Initially bradypneic, later respirations improved after Narcan, O2 sats down to 88% on room air at the time of my evaluation. No accessory muscle use.  Cardiovascular: Regular rate and rhythm, no murmurs / rubs / gallops. No extremity edema. 2+ pedal pulses.  Abdomen: obese, no tenderness, no masses palpated. No hepatosplenomegaly.   Musculoskeletal: no clubbing / cyanosis. No joint deformity upper and lower extremities. Good ROM, no contractures.  Skin: no rashes, lesions, ulcers. No induration Neurologic: Speech slurred, but able to understand, patient's sister at bedside, also patient both reports this is not her normal.  4/5 strength in all extremities.  No facial asymmetry. Psychiatric: Initially somnolent, now  awake and alert and oriented x4, reports she was given too much medications at the nursing home.  Labs on Admission: I have personally reviewed following labs and imaging studies  CBC: Recent Labs  Lab 03/09/21 1735 03/10/21 0530 03/11/21 1654  WBC 8.6 8.0 9.7  NEUTROABS 8.3*  --  8.3*  HGB 12.8 12.6 12.1  HCT 41.3 40.5 39.7  MCV 96.0 95.1 96.8  PLT 170 180 672   Basic Metabolic Panel: Recent Labs  Lab 03/09/21 1735 03/09/21 2129 03/10/21 0530 03/11/21 0739 03/11/21 1654  NA 135 133* 137 141 139  K 4.5 5.0 5.2* 5.1 4.1  CL 99 98 100 103 103  CO2 26 26 29 25 27   GLUCOSE 520* 414* 273* 101* 80  BUN 61* 61* 62* 60* 61*  CREATININE 2.14* 2.12* 2.00* 2.20* 2.13*  CALCIUM 9.0 8.2* 8.5* 8.8* 8.5*  MG  --  1.9  --   --   --   PHOS  --  4.8*  --   --   --    GFR: Estimated Creatinine Clearance: 30.5 mL/min (A) (by C-G formula based on SCr of 2.13 mg/dL (H)). Liver Function Tests: Recent Labs  Lab 03/09/21 1735 03/10/21 0530 03/11/21 1654  AST 12* 13* 14*  ALT 24 21 19   ALKPHOS 81 69 59  BILITOT 0.7 0.7 0.6  PROT 6.0* 5.8* 5.7*  ALBUMIN 2.7* 2.5* 2.5*   CBG: Recent Labs  Lab 03/11/21 1622 03/11/21 1634 03/11/21 1647 03/11/21 1733 03/11/21 2031  GLUCAP 89 77 77 111* 44*    Radiological Exams on Admission: CT Head Wo Contrast  Result Date: 03/11/2021 CLINICAL DATA:  Delirium altered EXAM: CT HEAD WITHOUT CONTRAST TECHNIQUE: Contiguous axial images were obtained from the base of  the skull through the vertex without intravenous contrast. COMPARISON:  CT 08/13/2020, MRI 08/15/2020 FINDINGS: Brain: No acute territorial infarction, hemorrhage, or intracranial mass. Mild atrophy. Chronic lacunar infarct within the left thalamus and left caudate. Moderate patchy white matter hypodensity consistent with chronic small vessel ischemic change. Stable ventricle size. Vascular: No hyperdense vessels.  Carotid vascular calcification Skull: Normal. Negative for fracture or focal  lesion. Sinuses/Orbits: Old left medial orbital wall fracture. Mild mucosal thickening in the sinuses. Other: None IMPRESSION: 1. No CT evidence for acute intracranial abnormality. 2. Atrophy and chronic small vessel ischemic changes. Electronically Signed   By: Donavan Foil M.D.   On: 03/11/2021 17:32    EKG: Independently reviewed.  Sinus rhythm rate 79, QTc 436.  Is present, rhythm irregular.  Premature atrial contractions.  New T wave inversions in V4 through V6  Assessment/Plan Principal Problem:   Acute metabolic encephalopathy Active Problems:   CKD (chronic kidney disease) stage 3, GFR 30-59 ml/min (H[facility])   Severe recurrent major depression without psychotic features ([facility])   Polysubstance abuse (including cocaine)   Chronic combined systolic and diastolic CHF (congestive heart failure) ([facility])   Atrial fibrillation, controlled ([facility])   Acute metabolic encephalopathy- likely 2/2 to combination of narcotics and hypoglycemia-blood glucose down to 44 in the ED, from Lantus, in the setting of renal insufficiency.  She also received 1 mg of Ativan.  Marked improvement in mental and respiratory status with Narcan 1 mg on my evaluation.  Head CT without acute abnormality.  Speech is slightly to moderately slurred, but likely from hypoglycemia,  narcotics and benzos.  No other focal neurologic deficits noted. -Monitor blood glucose closely  - Obtain ABG - Start Narcan drip -Start D10 30 cc/h -If persistent slurred speech in a.m. consider MRI brain. -Hold further psychoactive medications, oxycodone, Ativan, gabapentin -Repeat EKG in the morning given EKG changes, she has no chest pain.  Uncontrolled diabetes mellitus with hypoglycemia-blood glucose down to 44, after regular 25 units of insulin was given.  Recent hospitalization for hyperglycemia blood glucose in the 500s.  Appears to have labile blood sugars. Hgba1c- 11.1 -  Hold home Lantus- - D10 infusion, monitor blood sugars closely -Hold  Trulicity and linagliptin  Acute respiratory failure- 2/2 narcotics.  Respiratory down to 10.  Improved after IV Narcan.  Not currently on home O2, was on PRN o2 in the recent past. -Supplemental O2 - Obtain ABG  Essential hypertension- stable. - Continue amlodipine 10 mg  daily -Continue metoprolol 25 mg bid   CKD4 -stable.  Creatinine at baseline 2.13.  Hydration for AKI on CKD, improved with IV fluids and Lasix was held. -In setting of renal insufficiency, narcotics, benzos and insulin dosing may need to be adjusted on discharge -Hold Lasix 20mg  for now  COPD (chronic obstructive pulmonary disease) -Bronchodilators as needed  PAF (paroxysmal atrial fibrillation)-currently in sinus, rate controlled and on anticoagulation - Continue PO apixaban 5 mg bid, and metoprolol  Seizures (HCC) - Continue Keppra 750 mg p.o. twice daily.  Hyperlipidemia/Aortic atherosclerosis (HCC) - Continue rosuvastatin 10 mg p.o. daily.  Depression -Resume Cymbalta  Chronic systolic and diastolic CHF, stable and compensated.  Lasix held on recent hospitalization.  Last echo 01/2021 EF 55 to 60%. -Continue to hold Lasix   DVT prophylaxis: Eliquis Code Status: Full code Family Communication: Sister at bedside Disposition Plan: ~ 1- 2 days Consults called: None Admission status: Inpt Step down I certify that at the point of admission it is my clinical judgment  that the patient will require inpatient hospital care spanning beyond 2 midnights from the point of admission due to high intensity of service, high risk for further deterioration and high frequency of surveillance required.    Bethena Roys MD [facility]  03/11/2021, 10:09 PM

## 2021-03-11 NOTE — Discharge Summary (Signed)
Physician Discharge Summary  [name] [id number] DOB: 17-Apr-1966 DOA: 03/09/2021  PCP: Caprice Renshaw, MD  Admit date: 03/09/2021 Discharge date: 03/11/2021  Admitted From:  SNF   Disposition: SNF    Recommendations for Outpatient Follow-up:  1. Follow up with PCP in 1 weeks 2. Follow up with cardiology as scheduled 3. Establish care with nephrologist Dr. Theador Hawthorne in 1 month for CKD 4. Please check blood sugars 4 times daily and treat per protocol 5. Please obtain BMP in one week to check electrolytes (potassium)  Discharge Condition: STABLE   CODE STATUS: FULL DIET: heart healthy carb modified recommended   Brief Hospitalization Summary: Please see all hospital notes, images, labs for full details of the hospitalization. ADMISSION HPI: [name] is a 55 y.o. female with medical history significant of alcohol use, ankle fracture, anxiety/panic attacks, left breast mass, history of resoled combined systolic and diastolic heart failure with normalizing LVEF on most recent echo last month, normocytic anemia, stage III CKD, history of cocaine abuse, COPD, type II DM, diabetic Charcot foot, diabetic peripheral neuropathy, hypertension, history of GI bleed, hyperlipidemia, history of treatment noncompliance, paroxysmal atrial fibrillation, seizures, sleep apnea not on CPAP, history of CVA, history of tobacco abuse who is coming to the emergency department via EMS from [place] after she called 911 because she stated she has not had a dressing change in her lower extremity wounds since 03/06/2021.  She also stated that she has not been eating or drinking much.  She has been very thirsty and urinating frequently.  She is taking prednisone 50 mg daily, but was unable to tell me why she is taking it.  She states on her wheelchair most of the time.  She denies fever, chills, sore throat, rhinorrhea, wheezing or hemoptysis.  No dyspnea, chest pain, palpitations, diaphoresis,  PND, orthopnea, but she gets occasional lower extremity edema.  Denies abdominal pain, diarrhea, constipation, melena or hematochezia.  No dysuria, frequency or hematuria.  ED Course: Initial vital signs were temperature 99.4 F, pulse 102, respirations 20, BP 142/106 mmHg O2 sat 93% on room air.  The patient received 10 units of insulin IVP and 500 mL of NS bolus.  I added another 1000 mL of NS bolus.  Lab work: CBC showed a white count of 8.6 with 97% neutrophils, hemoglobin 12.8 g/dL platelets 170.  CMP showed normal electrolytes.  Glucose 128, BUN 61 creatinine 2.14 mg/dL (previous measurements of creatinine in the last 3 months has been ranging from 1.30 to 1.69 mg/dL and GFR between 35 to 48 mL/min.Total protein was 6.0, albumin 2.7 g/dL.  The rest of the hepatic functions were unremarkable.  BNP was 87.0 pg/mL.  Imaging: A portable 1 view chest radiograph showed cardiomegaly without evidence of edema or airspace disease.  Please see images and full radiology report for further detail.  Pt was admitted for hyperosmolar hyperglycemia with associated dehydration, AKI in setting of CKD, and evaluation of lower extremity wounds.  Hospital Course    AKI (acute kidney injury) onCKD (chronic kidney disease) stage 3b - Continue to Hold furosemide, restart lasix 20 mg every other day in 1 week to allow for diuretic holiday - Treated IV fluids. - recheck BMP in 1 week recommended - RECOMMEND ESTABLISH CARE WITH NEPHROLOGIST DR Theador Hawthorne in 1 Month   Open wound of both lower extremities, Moisture-Associated Skin Damage of perianal and groin - is a chronic issue - No signs of active infection. - Treated with PO fluconazole 150  mg daily x 2 - Continue nystatin 69mL 4 times daily for 5d - Started collagenase cream directly to L ankle wound - Started topical ketoconazole cream and nystatin powder for MASD - Wound Care telehealth visit 5/16, appreciate recs:       - Cover leg wounds with dry  dressings and kerlix       - Use barrier cream in perianal and groin  Essential hypertension - Continue amlodipine 10 mg p.o. twice daily - BP elevated to 171/113 o/n 5/15-5/16 - Started PO metoprolol 25 mg bid on 5/16 - Continue to monitor BP   Hyperkalemia - TREATED AND RESOLVED - lokelma 10 g TID ordered - recheck BMP in 1 week outpatient to follow up electrolytes  Hyperglycemia and peripheral neuropathy in setting of DM2 - Novolog 14U SQ tid w meals used in hospital with better blood sugar control - Lantus 25U SQ bid - Continue gabapentin 300 mg p.o. 3 times daily for peripheral neuropathy - Please monitor blood sugars at least 4 times per day and adjust therapy as needed.   COPD (chronic obstructive pulmonary disease) - Pt not reporting SOB - Supplemental oxygen as needed. SpO2 currently 99% - Bronchodilators as needed.  PAF (paroxysmal atrial fibrillation) (HCC) - CHA2DS2VASc Scoreof at least 7. - Continue PO apixaban 5 mg bid -  5/16 pt had 9-beat run of V-Tach while sleeping. Pt denies SOB or chest pain  - pt started on metoprolol 25 mg BID with better rate control noticed.   Seizures (HCC) - Continue Keppra 750 mg p.o. twice daily.  Hyperlipidemia/Aortic atherosclerosis (HCC) - Continue rosuvastatin 10 mg p.o. daily.  ToC - SW consulted 5/16  DVT prophylaxis: Apixiban Code Status: FULL  Disposition: return to SNF   Discharge Diagnoses:  Principal Problem:   Diabetic hyperosmolar non-ketotic state ([place]) Active Problems:   Essential hypertension   Diabetic neuropathy (HCC)   CKD (chronic kidney disease) stage 3, GFR 30-59 ml/min (HCC)   COPD (chronic obstructive pulmonary disease) (HCC)   AKI (acute kidney injury) (HCC)   PAF (paroxysmal atrial fibrillation) (HCC)   Pressure injury of skin   Seizures ([place])   Sacral ulcer, limited to breakdown of skin ([place])   Aortic atherosclerosis ([place])   Open wound of both lower extremities   Oral  candidiasis   Intertrigo   Discharge Instructions: Discharge Instructions    Ambulatory referral to Nephrology   Complete by: As directed      Allergies as of 03/11/2021   No Known Allergies     Medication List    STOP taking these medications   guaiFENesin 600 MG 12 hr tablet Commonly known as: MUCINEX   Insulin Glargine-yfgn 100 UNIT/ML Sopn   metoprolol succinate 100 MG 24 hr tablet Commonly known as: TOPROL-XL   omeprazole 20 MG capsule Commonly known as: PRILOSEC   pantoprazole 40 MG tablet Commonly known as: PROTONIX   polyethylene glycol 17 g packet Commonly known as: MiraLax   Potassium Chloride ER 20 MEQ Tbcr   predniSONE 50 MG tablet Commonly known as: DELTASONE   sitaGLIPtin 100 MG tablet Commonly known as: JANUVIA   TUMS PO     TAKE these medications   acetaminophen 325 MG tablet Commonly known as: TYLENOL Take 2 tablets (650 mg total) by mouth every 6 (six) hours as needed for headache, fever or mild pain.   albuterol 108 (90 Base) MCG/ACT inhaler Commonly known as: ProAir HFA INHALE 2 PUFFS EVERY 6 HOURS AS NEEDED FOR SHORTNESS OF  BREATH/WHEEZING. What changed:   how much to take  how to take this  when to take this  reasons to take this  additional instructions   amLODipine 10 MG tablet Commonly known as: NORVASC Take 1 tablet (10 mg total) by mouth daily.   apixaban 5 MG Tabs tablet Commonly known as: Eliquis Take 1 tablet (5 mg total) by mouth 2 (two) times daily. Okay to restart Eliquis on 11/17/2020   DULoxetine 60 MG capsule Commonly known as: CYMBALTA Take 1 capsule (60 mg total) by mouth daily.   furosemide 40 MG tablet Commonly known as: Lasix Take 0.5 tablets (20 mg total) by mouth every other day. Start taking on: Mar 18, 2021 What changed:   how much to take  when to take this  These instructions start on Mar 18, 2021. If you are unsure what to do until then, ask your doctor or other care provider.    gabapentin 300 MG capsule Commonly known as: NEURONTIN TAKE 1 CAPSULE BY MOUTH THREE TIMES A DAY. What changed: See the new instructions.   insulin glargine 100 UNIT/ML injection Commonly known as: LANTUS Inject 0.25 mLs (25 Units total) into the skin 2 (two) times daily.   insulin lispro 100 UNIT/ML injection Commonly known as: HUMALOG Inject into the skin 3 (three) times daily before meals. 151-200= 1 unit, 201-250= 2 units, 251-300= 4 units, 301-350= 6 units, 351-400=8 units, 401-500= 10  units   ketoconazole 2 % cream Commonly known as: NIZORAL Apply topically 2 (two) times daily for 7 days. Apply in cheeks and perineal area with yeast rash.   levETIRAcetam 750 MG tablet Commonly known as: KEPPRA Take 1 tablet (750 mg total) by mouth 2 (two) times daily.   linagliptin 5 MG Tabs tablet Commonly known as: TRADJENTA Take 5 mg by mouth daily.   LORazepam 0.5 MG tablet Commonly known as: ATIVAN Take 1 tablet (0.5 mg total) by mouth every 8 (eight) hours as needed for anxiety.   metoprolol tartrate 25 MG tablet Commonly known as: LOPRESSOR Take 1 tablet (25 mg total) by mouth 2 (two) times daily.   montelukast 10 MG tablet Commonly known as: SINGULAIR TAKE (1) TABLET BY MOUTH AT BEDTIME. What changed: See the new instructions.   nystatin 100000 UNIT/ML suspension Commonly known as: MYCOSTATIN Take 5 mLs by mouth 4 (four) times daily.   oxybutynin 5 MG 24 hr tablet Commonly known as: DITROPAN-XL Take 5 mg by mouth at bedtime.   oxyCODONE 5 MG immediate release tablet Commonly known as: Oxy IR/ROXICODONE Take 1 tablet (5 mg total) by mouth 3 (three) times daily as needed for severe pain. What changed: reasons to take this   rosuvastatin 10 MG tablet Commonly known as: CRESTOR Take 1 tablet (10 mg total) by mouth daily.   Spiriva HandiHaler 18 MCG inhalation capsule Generic drug: tiotropium Place 18 mcg into inhaler and inhale daily as needed (Shortness of  breath).   traZODone 50 MG tablet Commonly known as: DESYREL Take 1 tablet (50 mg total) by mouth at bedtime.   vitamin C 500 MG tablet Commonly known as: ASCORBIC ACID Take 500 mg by mouth daily.   VITAMIN D (CHOLECALCIFEROL) PO Take 5,000 Units by mouth daily.   Zinc Oxide 10 % Aero Apply 1 application topically daily.   zinc sulfate 220 (50 Zn) MG capsule Take 1 capsule (220 mg total) by mouth daily.       Follow-up Information    Caprice Renshaw, MD .  Specialty: Internal Medicine Contact information: [place] [address] [phone]        Fay Records, MD .   Specialty: Cardiology Contact information: [address] [place] [address] [phone]              No Known Allergies Allergies as of 03/11/2021   No Known Allergies     Medication List    STOP taking these medications   guaiFENesin 600 MG 12 hr tablet Commonly known as: MUCINEX   Insulin Glargine-yfgn 100 UNIT/ML Sopn   metoprolol succinate 100 MG 24 hr tablet Commonly known as: TOPROL-XL   omeprazole 20 MG capsule Commonly known as: PRILOSEC   pantoprazole 40 MG tablet Commonly known as: PROTONIX   polyethylene glycol 17 g packet Commonly known as: MiraLax   Potassium Chloride ER 20 MEQ Tbcr   predniSONE 50 MG tablet Commonly known as: DELTASONE   sitaGLIPtin 100 MG tablet Commonly known as: JANUVIA   TUMS PO     TAKE these medications   acetaminophen 325 MG tablet Commonly known as: TYLENOL Take 2 tablets (650 mg total) by mouth every 6 (six) hours as needed for headache, fever or mild pain.   albuterol 108 (90 Base) MCG/ACT inhaler Commonly known as: ProAir HFA INHALE 2 PUFFS EVERY 6 HOURS AS NEEDED FOR SHORTNESS OF BREATH/WHEEZING. What changed:   how much to take  how to take this  when to take this  reasons to take this  additional instructions   amLODipine 10 MG tablet Commonly known as: NORVASC Take 1 tablet (10 mg total) by  mouth daily.   apixaban 5 MG Tabs tablet Commonly known as: Eliquis Take 1 tablet (5 mg total) by mouth 2 (two) times daily. Okay to restart Eliquis on 11/17/2020   DULoxetine 60 MG capsule Commonly known as: CYMBALTA Take 1 capsule (60 mg total) by mouth daily.   furosemide 40 MG tablet Commonly known as: Lasix Take 0.5 tablets (20 mg total) by mouth every other day. Start taking on: Mar 18, 2021 What changed:   how much to take  when to take this  These instructions start on Mar 18, 2021. If you are unsure what to do until then, ask your doctor or other care provider.   gabapentin 300 MG capsule Commonly known as: NEURONTIN TAKE 1 CAPSULE BY MOUTH THREE TIMES A DAY. What changed: See the new instructions.   insulin glargine 100 UNIT/ML injection Commonly known as: LANTUS Inject 0.25 mLs (25 Units total) into the skin 2 (two) times daily.   insulin lispro 100 UNIT/ML injection Commonly known as: HUMALOG Inject into the skin 3 (three) times daily before meals. 151-200= 1 unit, 201-250= 2 units, 251-300= 4 units, 301-350= 6 units, 351-400=8 units, 401-500= 10  units   ketoconazole 2 % cream Commonly known as: NIZORAL Apply topically 2 (two) times daily for 7 days. Apply in cheeks and perineal area with yeast rash.   levETIRAcetam 750 MG tablet Commonly known as: KEPPRA Take 1 tablet (750 mg total) by mouth 2 (two) times daily.   linagliptin 5 MG Tabs tablet Commonly known as: TRADJENTA Take 5 mg by mouth daily.   LORazepam 0.5 MG tablet Commonly known as: ATIVAN Take 1 tablet (0.5 mg total) by mouth every 8 (eight) hours as needed for anxiety.   metoprolol tartrate 25 MG tablet Commonly known as: LOPRESSOR Take 1 tablet (25 mg total) by mouth 2 (two) times daily.   montelukast 10 MG  tablet Commonly known as: SINGULAIR TAKE (1) TABLET BY MOUTH AT BEDTIME. What changed: See the new instructions.   nystatin 100000 UNIT/ML suspension Commonly known as:  MYCOSTATIN Take 5 mLs by mouth 4 (four) times daily.   oxybutynin 5 MG 24 hr tablet Commonly known as: DITROPAN-XL Take 5 mg by mouth at bedtime.   oxyCODONE 5 MG immediate release tablet Commonly known as: Oxy IR/ROXICODONE Take 1 tablet (5 mg total) by mouth 3 (three) times daily as needed for severe pain. What changed: reasons to take this   rosuvastatin 10 MG tablet Commonly known as: CRESTOR Take 1 tablet (10 mg total) by mouth daily.   Spiriva HandiHaler 18 MCG inhalation capsule Generic drug: tiotropium Place 18 mcg into inhaler and inhale daily as needed (Shortness of breath).   traZODone 50 MG tablet Commonly known as: DESYREL Take 1 tablet (50 mg total) by mouth at bedtime.   vitamin C 500 MG tablet Commonly known as: ASCORBIC ACID Take 500 mg by mouth daily.   VITAMIN D (CHOLECALCIFEROL) PO Take 5,000 Units by mouth daily.   Zinc Oxide 10 % Aero Apply 1 application topically daily.   zinc sulfate 220 (50 Zn) MG capsule Take 1 capsule (220 mg total) by mouth daily.      Procedures/Studies: DG Chest Port 1 View  Result Date: 03/09/2021 CLINICAL DATA:  Tachycardia EXAM: PORTABLE CHEST 1 VIEW COMPARISON:  December 03, 2020 FINDINGS: The lungs are clear. There is cardiomegaly with pulmonary vascularity normal. No adenopathy. No bone lesions. IMPRESSION: Cardiomegaly.  No edema or airspace opacity. Electronically Signed   By: Lowella Grip III M.D.   On: 03/09/2021 17:39      Subjective: Pt is demanding to discharge NOW.  Pt threatening to sign out AMA.  Pt reports that she will walk if she has to.  No specific complaints.  No CP, No SOB. She is sitting up in chair and she is eating and drinking well.  Blood sugars are much better controlled now.    Discharge Exam: Vitals:   03/11/21 0529 03/11/21 0826  BP: 126/80 (!) 153/101  Pulse: 91 94  Resp: 18   Temp: 97.8 F (36.6 C)   SpO2: 90%    Vitals:   03/10/21 1129 03/10/21 2016 03/11/21 0529 03/11/21  0826  BP: (!) 153/104 (!) 123/95 126/80 (!) 153/101  Pulse: 95 88 91 94  Resp:  20 18   Temp:  98.6 F (37 C) 97.8 F (36.6 C)   TempSrc:  Oral    SpO2:  91% 90%   Weight:      Height:       General: Pt is alert, angry and confrontational, awake, not in acute distress Cardiovascular:  Normal S1/S2 +, no rubs, no gallops Respiratory: CTA bilaterally, no wheezing, no rhonchi Abdominal: Soft, NT, ND, bowel sounds + Extremities: no edema, no cyanosis   The results of significant diagnostics from this hospitalization (including imaging, microbiology, ancillary and laboratory) are listed below for reference.     Microbiology: Recent Results (from the past 240 hour(s))  Resp Panel by RT-PCR (Flu A&B, Covid) Nasopharyngeal Swab     Status: None   Collection Time: 03/09/21  7:14 PM   Specimen: Nasopharyngeal Swab; Nasopharyngeal(NP) swabs in vial transport medium  Result Value Ref Range Status   SARS Coronavirus 2 by RT PCR NEGATIVE NEGATIVE Final    Comment: (NOTE) SARS-CoV-2 target nucleic acids are NOT DETECTED.  The SARS-CoV-2 RNA is generally detectable in upper  respiratory specimens during the acute phase of infection. The lowest concentration of SARS-CoV-2 viral copies this assay can detect is 138 copies/mL. A negative result does not preclude SARS-Cov-2 infection and should not be used as the sole basis for treatment or other patient management decisions. A negative result may occur with  improper specimen collection/handling, submission of specimen other than nasopharyngeal swab, presence of viral mutation(s) within the areas targeted by this assay, and inadequate number of viral copies(<138 copies/mL). A negative result must be combined with clinical observations, patient history, and epidemiological information. The expected result is Negative.  Fact Sheet for Patients:  EntrepreneurPulse.com.au  Fact Sheet for Healthcare Providers:   IncredibleEmployment.be  This test is no t yet approved or cleared by the [place] FDA and  has been authorized for detection and/or diagnosis of SARS-CoV-2 by FDA under an Emergency Use Authorization (EUA). This EUA will remain  in effect (meaning this test can be used) for the duration of the COVID-19 declaration under Section 564(b)(1) of the Act, 21 U.S.C.section 360bbb-3(b)(1), unless the authorization is terminated  or revoked sooner.       Influenza A by PCR NEGATIVE NEGATIVE Final   Influenza B by PCR NEGATIVE NEGATIVE Final    Comment: (NOTE) The Xpert Xpress SARS-CoV-2/FLU/RSV plus assay is intended as an aid in the diagnosis of influenza from Nasopharyngeal swab specimens and should not be used as a sole basis for treatment. Nasal washings and aspirates are unacceptable for Xpert Xpress SARS-CoV-2/FLU/RSV testing.  Fact Sheet for Patients: EntrepreneurPulse.com.au  Fact Sheet for Healthcare Providers: IncredibleEmployment.be  This test is not yet approved or cleared by the [place] FDA and has been authorized for detection and/or diagnosis of SARS-CoV-2 by FDA under an Emergency Use Authorization (EUA). This EUA will remain in effect (meaning this test can be used) for the duration of the COVID-19 declaration under Section 564(b)(1) of the Act, 21 U.S.C. section 360bbb-3(b)(1), unless the authorization is terminated or revoked.  Performed at [facility], [address] [place], [place] [address]      Labs: BNP (last 3 results) Recent Labs    01/16/21 1055 03/09/21 1735  BNP 352.0* 19.6   Basic Metabolic Panel: Recent Labs  Lab 03/09/21 1735 03/09/21 2129 03/10/21 0530 03/11/21 0739  NA 135 133* 137 141  K 4.5 5.0 5.2* 5.1  CL 99 98 100 103  CO2 26 26 29 25   GLUCOSE 520* 414* 273* 101*  BUN 61* 61* 62* 60*  CREATININE 2.14* 2.12* 2.00* 2.20*  CALCIUM 9.0 8.2* 8.5* 8.8*  MG  --   1.9  --   --   PHOS  --  4.8*  --   --    Liver Function Tests: Recent Labs  Lab 03/09/21 1735 03/10/21 0530  AST 12* 13*  ALT 24 21  ALKPHOS 81 69  BILITOT 0.7 0.7  PROT 6.0* 5.8*  ALBUMIN 2.7* 2.5*   No results for input(s): LIPASE, AMYLASE in the last 168 hours. No results for input(s): AMMONIA in the last 168 hours. CBC: Recent Labs  Lab 03/09/21 1735 03/10/21 0530  WBC 8.6 8.0  NEUTROABS 8.3*  --   HGB 12.8 12.6  HCT 41.3 40.5  MCV 96.0 95.1  PLT 170 180   Cardiac Enzymes: No results for input(s): CKTOTAL, CKMB, CKMBINDEX, TROPONINI in the last 168 hours. BNP: Invalid input(s): POCBNP CBG: Recent Labs  Lab 03/09/21 2018 03/09/21 2206 03/10/21 0711 03/10/21 1111 03/10/21 1642  GLUCAP 397* 357* 247* 228*  75   D-Dimer No results for input(s): DDIMER in the last 72 hours. Hgb A1c No results for input(s): HGBA1C in the last 72 hours. Lipid Profile No results for input(s): CHOL, HDL, LDLCALC, TRIG, CHOLHDL, LDLDIRECT in the last 72 hours. Thyroid function studies No results for input(s): TSH, T4TOTAL, T3FREE, THYROIDAB in the last 72 hours.  Invalid input(s): FREET3 Anemia work up No results for input(s): VITAMINB12, FOLATE, FERRITIN, TIBC, IRON, RETICCTPCT in the last 72 hours. Urinalysis    Component Value Date/Time   COLORURINE YELLOW 10/29/2020 2012   APPEARANCEUR CLOUDY (A) 10/29/2020 2012   LABSPEC 1.016 10/29/2020 2012   PHURINE 5.0 10/29/2020 2012   GLUCOSEU >=500 (A) 10/29/2020 2012   HGBUR MODERATE (A) 10/29/2020 2012   BILIRUBINUR NEGATIVE 10/29/2020 2012   KETONESUR 20 (A) 10/29/2020 2012   PROTEINUR >=300 (A) 10/29/2020 2012   UROBILINOGEN 0.2 09/11/2014 1319   NITRITE NEGATIVE 10/29/2020 2012   LEUKOCYTESUR LARGE (A) 10/29/2020 2012   Sepsis Labs Invalid input(s): PROCALCITONIN,  WBC,  LACTICIDVEN Microbiology Recent Results (from the past 240 hour(s))  Resp Panel by RT-PCR (Flu A&B, Covid) Nasopharyngeal Swab     Status: None    Collection Time: 03/09/21  7:14 PM   Specimen: Nasopharyngeal Swab; Nasopharyngeal(NP) swabs in vial transport medium  Result Value Ref Range Status   SARS Coronavirus 2 by RT PCR NEGATIVE NEGATIVE Final    Comment: (NOTE) SARS-CoV-2 target nucleic acids are NOT DETECTED.  The SARS-CoV-2 RNA is generally detectable in upper respiratory specimens during the acute phase of infection. The lowest concentration of SARS-CoV-2 viral copies this assay can detect is 138 copies/mL. A negative result does not preclude SARS-Cov-2 infection and should not be used as the sole basis for treatment or other patient management decisions. A negative result may occur with  improper specimen collection/handling, submission of specimen other than nasopharyngeal swab, presence of viral mutation(s) within the areas targeted by this assay, and inadequate number of viral copies(<138 copies/mL). A negative result must be combined with clinical observations, patient history, and epidemiological information. The expected result is Negative.  Fact Sheet for Patients:  EntrepreneurPulse.com.au  Fact Sheet for Healthcare Providers:  IncredibleEmployment.be  This test is no t yet approved or cleared by the [place] FDA and  has been authorized for detection and/or diagnosis of SARS-CoV-2 by FDA under an Emergency Use Authorization (EUA). This EUA will remain  in effect (meaning this test can be used) for the duration of the COVID-19 declaration under Section 564(b)(1) of the Act, 21 U.S.C.section 360bbb-3(b)(1), unless the authorization is terminated  or revoked sooner.       Influenza A by PCR NEGATIVE NEGATIVE Final   Influenza B by PCR NEGATIVE NEGATIVE Final    Comment: (NOTE) The Xpert Xpress SARS-CoV-2/FLU/RSV plus assay is intended as an aid in the diagnosis of influenza from Nasopharyngeal swab specimens and should not be used as a sole basis for treatment.  Nasal washings and aspirates are unacceptable for Xpert Xpress SARS-CoV-2/FLU/RSV testing.  Fact Sheet for Patients: EntrepreneurPulse.com.au  Fact Sheet for Healthcare Providers: IncredibleEmployment.be  This test is not yet approved or cleared by the [place] FDA and has been authorized for detection and/or diagnosis of SARS-CoV-2 by FDA under an Emergency Use Authorization (EUA). This EUA will remain in effect (meaning this test can be used) for the duration of the COVID-19 declaration under Section 564(b)(1) of the Act, 21 U.S.C. section 360bbb-3(b)(1), unless the authorization is terminated or revoked.  Performed at  [facility], [address] [place], [place] [address]    Time coordinating discharge: 37 mins  SIGNED:  Irwin Brakeman, MD  [facility] 03/11/2021, 9:02 AM How to contact the [facility] Attending or Consulting provider [place] or covering provider during after hours [place], for this patient?  1. Check the care team in [facility] and look for a) attending/consulting [facility] provider listed and b) the [facility] team listed 2. Log into [email or web address] and use [place]'s universal password to access. If you do not have the password, please contact the hospital operator. 3. Locate the [facility] provider you are looking for under [facility] and page to a number that you can be directly reached. 4. If you still have difficulty reaching the provider, please page the [facility] (Director on Call) for the Hospitalists listed on amion for assistance.

## 2021-03-11 NOTE — TOC Transition Note (Signed)
Transition of Care [facility]) - CM/SW Discharge Note   Patient Details  Name: LULIA SCHRINER MRN: [id number] Date of Birth: 1966/03/27  [facility]) CM/SW Contact:  Shade Flood, LCSW Phone Number: 03/11/2021, 9:44 AM   Clinical Narrative:     Pt stable for dc back to [facility] today per MD. Updated Clarene Critchley at [facility]. DC clinical sent electronically. RN to call report. Pt will transfer with EMS. There are no other TOC needs for dc.  Final next level of care: Long Term Nursing Home Barriers to Discharge: Barriers Resolved   Patient Goals and CMS Choice Patient states their goals for this hospitalization and ongoing recovery are:: return to [facility]   Choice offered to / list presented to : Patient  Discharge Placement                       Discharge Plan and Services In-house Referral: Clinical Social Work   Post Acute Care Choice: Resumption of Svcs/PTA Provider          DME Arranged: N/A                    Social Determinants of Health (SDOH) Interventions     Readmission Risk Interventions Readmission Risk Prevention Plan 10/31/2020 08/27/2020  Transportation Screening Complete Complete  Medication Review Press photographer) Complete Complete  PCP or Specialist appointment within 3-5 days of discharge - Complete  HRI or [place] - Complete  SW Recovery Care/Counseling Consult Complete Complete  Palliative Care Screening Not Applicable Not Applicable  Skilled Nursing Facility Complete Complete  Some recent data might be hidden

## 2021-03-11 NOTE — Progress Notes (Signed)
Discharge instructions given to pt. Pt verbalized understanding. Called report to Susa Loffler RN. Pt is just waiting on EMS to arrive to take her to the facility.

## 2021-03-11 NOTE — ED Notes (Signed)
Pt hard to arouse. Pt does not respond to pain. VSS. Logan PA made aware. Verbal to give 0.4 narcan IV given.

## 2021-03-11 NOTE — Discharge Instructions (Signed)
Chronic Kidney Disease, Adult Chronic kidney disease is when lasting damage happens to the kidneys slowly over a long time. The kidneys help to:  Make pee (urine).  Make hormones.  Keep the right amount of fluids and chemicals in the body. Most often, this disease does not go away. You must take steps to help keep the kidney damage from getting worse. If steps are not taken, the kidneys might stop working forever. What are the causes?  Diabetes.  High blood pressure.  Diseases that affect the heart and blood vessels.  Other kidney diseases.  Diseases of the body's disease-fighting system.  A problem with the flow of pee.  Infections of the organs that make pee, store it, and take it out of the body.  Swelling or irritation of your blood vessels. What increases the risk?  Getting older.  Having someone in your family who has kidney disease or kidney failure.  Having a disease caused by genes.  Taking medicines often that harm the kidneys.  Being near or having contact with harmful substances.  Being very overweight.  Using tobacco now or in the past. What are the signs or symptoms?  Feeling very tired.  Having a swollen face, legs, ankles, or feet.  Feeling like you may vomit or vomiting.  Not feeling hungry.  Being confused or not able to focus.  Twitches and cramps in the leg muscles or other muscles.  Dry, itchy skin.  A taste of metal in your mouth.  Making less pee, or making more pee.  Shortness of breath.  Trouble sleeping. You may also become anemic or get weak bones. Anemic means there is not enough red blood cells or hemoglobin in your blood. You may get symptoms slowly. You may not notice them until the kidney damage gets very bad. How is this treated? Often, there is no cure for this disease. Treatment can help with symptoms and help keep the disease from getting worse. You may need to:  Avoid alcohol.  Avoid foods that are high in  salt, potassium, phosphorous, and protein.  Take medicines for symptoms and to help control other conditions.  Have dialysis. This treatment gets harmful waste out of your body.  Treat other problems that cause your kidney disease or make it worse. Follow these instructions at home: Medicines  Take over-the-counter and prescription medicines only as told by your doctor.  Do not take any new medicines, vitamins, or supplements unless your doctor says it is okay. Lifestyle  Do not smoke or use any products that contain nicotine or tobacco. If you need help quitting, ask your doctor.  If you drink alcohol: ? Limit how much you use to:  0-1 drink a day for women who are not pregnant.  0-2 drinks a day for men. ? Know how much alcohol is in your drink. In the [place] one drink equals one 12 oz bottle of beer (355 mL), one 5 oz glass of wine (148 mL), or one 1 oz glass of hard liquor (44 mL).  Stay at a healthy weight. If you need help losing weight, ask your doctor.   General instructions  Follow instructions from your doctor about what you cannot eat or drink.  Track your blood pressure at home. Tell your doctor about any changes.  If you have diabetes, track your blood sugar.  Exercise at least 30 minutes a day, 5 days a week.  Keep your shots (vaccinations) up to date.  Keep all follow-up visits.     Where to find more information  American Association of Kidney Patients: www.aakp.org  National Kidney Foundation: www.kidney.org  American Kidney Fund: www.akfinc.org  Life Options: www.lifeoptions.org  Kidney School: www.kidneyschool.org Contact a doctor if:  Your symptoms get worse.  You get new symptoms. Get help right away if:  You get symptoms of end-stage kidney disease. These include: ? Headaches. ? Losing feeling in your hands or feet. ? Easy bruising. ? Having hiccups often. ? Chest pain. ? Shortness of breath. ? Lack of menstrual periods, in  women.  You have a fever.  You make less pee than normal.  You have pain or you bleed when you pee or poop. These symptoms may be an emergency. Get help right away. Call your local emergency services (911 in the [place]  Do not wait to see if the symptoms will go away.  Do not drive yourself to the hospital. Summary  Chronic kidney disease is when lasting damage happens to the kidneys slowly over a long time.  Causes of this disease include diabetes and high blood pressure.  Often, there is no cure for this disease. Treatment can help symptoms and help keep the disease from getting worse.  Treatment may involve lifestyle changes, medicines, and dialysis. This information is not intended to replace advice given to you by your health care provider. Make sure you discuss any questions you have with your health care provider. Document Revised: 01/17/2020 Document Reviewed: 01/17/2020 Elsevier Patient Education  2021 Elsevier Inc.  

## 2021-03-11 NOTE — ED Triage Notes (Signed)
Pt brought in by [facility] from [place] with c/o AMS over the last 45 minutes. Pt was just discharged from [facility] at 1215 today and was normal at that time. CBG 104 for EMS. CBG 89 upon arrival to ED. Pt was given Oxycodone and Ativan this afternoon by nursing home and these are her normal medications. Nursing facility reports they can't keep pt awake.

## 2021-03-11 NOTE — ED Provider Notes (Signed)
  Face-to-face evaluation   History: She presents for evaluation of altered mental status which occurred suddenly as she was smoking outside.  She was discharged from the hospital earlier today after hospitalization for wounds on her lower legs.  It is suspected that she took an opiate, hydrocodone, prior to the episode of altered mental status.  Patient is unable to give any history.  Level 5 caveat-altered mental status  Physical exam: Obese patient.  I examined her about 2 minutes after she received IV Narcan;, 4:45 PM.  She opened her eyes and responded to voice, and was able to move arms and legs independently on command.  She has dysarthria at this time.  CBG, 77.  Medical screening examination/treatment/procedure(s) were conducted as a shared visit with non-physician practitioner(s) and myself.  I personally evaluated the patient during the encounter    Daleen Bo, MD 03/12/21 [phone]

## 2021-03-12 LAB — GLUCOSE, CAPILLARY
Glucose-Capillary: 226 mg/dL — ABNORMAL HIGH (ref 70–99)
Glucose-Capillary: 144 mg/dL — ABNORMAL HIGH (ref 70–99)
Glucose-Capillary: 100 mg/dL — ABNORMAL HIGH (ref 70–99)
Glucose-Capillary: 86 mg/dL (ref 70–99)
Glucose-Capillary: 263 mg/dL — ABNORMAL HIGH (ref 70–99)
Glucose-Capillary: 371 mg/dL — ABNORMAL HIGH (ref 70–99)
Glucose-Capillary: 288 mg/dL — ABNORMAL HIGH (ref 70–99)
Glucose-Capillary: 415 mg/dL — ABNORMAL HIGH (ref 70–99)
Glucose-Capillary: 373 mg/dL — ABNORMAL HIGH (ref 70–99)

## 2021-03-12 LAB — MRSA PCR SCREENING: MRSA by PCR: NEGATIVE

## 2021-03-12 MED ORDER — INSULIN ASPART 100 UNIT/ML IJ SOLN
0.0000 [IU] | Freq: Three times a day (TID) | INTRAMUSCULAR | Status: DC
  Administered 2021-03-13 (×2): 5 [IU] via SUBCUTANEOUS

## 2021-03-12 MED ORDER — CHLORHEXIDINE GLUCONATE CLOTH 2 % EX PADS
6.0000 | MEDICATED_PAD | Freq: Every day | CUTANEOUS | Status: DC
  Administered 2021-03-12 – 2021-03-13 (×2): 6 via TOPICAL

## 2021-03-12 MED ORDER — INSULIN ASPART 100 UNIT/ML IJ SOLN
0.0000 [IU] | Freq: Every day | INTRAMUSCULAR | Status: DC
  Administered 2021-03-12: 5 [IU] via SUBCUTANEOUS

## 2021-03-12 NOTE — Progress Notes (Signed)
EKG done and given to nurse 

## 2021-03-12 NOTE — Consult Note (Signed)
Patient was seen last admission/ DC 03/11/21 and returned within 4 hours; reviewed new images they appear unchanged since my last assessment   Consultation completed by use of remote telehealth camera cart and with assistance from bedside nurse/clinical staff  Reason for Consult: bilateral pressure injuries on ankles Wound type: THESE ARE NOT PRESSURE INJURIES 1. Left medial ankle; venous ulceration; full thickness 2. Right medial ankle and foot; intact serous filled blister 3. Right lateral ankle; partial thickness venous ulcerations  Pressure Injury POA: NA Measurement: see nursing flowsheet  Wound bed:  Left medial ankle; 20% soft brown slough/80% pink early granulation Right medial ankle; intact blisters Right lateral ankle; 100% pink Drainage (amount, consistency, odor) minimal Periwound: edema  Dressing procedure/placement/frequency: Enzymatic debridement to the right medial; top with saline; dry dressing; kerlix Non adherent; single layer over intact blisters; and open ulcerations.  Cover with dry dressings and kerlix. Buttock wounds with barrier cream which is appropriate.    Re consult if needed, will not follow at this time. Thanks  Viren Lebeau [employer], RN,CWOCN, CNS, [place] [phone])

## 2021-03-12 NOTE — Progress Notes (Signed)
Patient Demographics:    [name], is a 55 y.o. female, DOB - 06/21/1966, [id number]  Admit date - 03/11/2021   Admitting Physician Ejiroghene Arlyce Dice, MD  Outpatient Primary MD for the patient is Caprice Renshaw, MD  LOS - 1   Chief Complaint  Patient presents with  . Altered Mental Status        Subjective:    [name] today has no fevers, no emesis,  No chest pain,  * -Son [place] at bedside trying to encourage patient to be cooperative and to eat  Assessment  & Plan :    Principal Problem:   Acute metabolic encephalopathy Active Problems:   CKD (chronic kidney disease) stage 3, GFR 30-59 ml/min (HCC)   Chronic combined systolic and diastolic CHF (congestive heart failure) (HCC)   Severe recurrent major depression without psychotic features (HCC)   Polysubstance abuse (including cocaine)   Atrial fibrillation, controlled ([place])   Metabolic encephalopathy  Brief Summary:- 55 y.o. female with medical history significant for systolic and diastolic CHF, COPD, diabetes mellitus, atrial fibrillation, seizures, prior h/o polysubstance abuse, CKD 3, hypertension readmitted on 03/11/2021 after being discharged from the hospital with altered mentation and found to have hypoglycemia  A/p 1) acute metabolic encephalopathy suspect mostly due to hypoglycemia with some component of oxycodone/opiate related lethargy -Overall improving off Narcan -Okay to stop IV dextrose solution, if oral intake remains reliable may restart insulin regimen with adjustments -Mentation and speech as per son who is at bedside appears back to baseline  2)DM2-A1c 11.1 reflecting uncontrolled DM with hyperglycemia -However patient was admitted this time with hypoglycemia with blood sugar down to 44 after receiving regular insulin -During recent hospitalization patient blood sugars were over 500 -Overall patient blood  sugars are difficult to control she goes high and then she goes low  3)Bil LE Decubitus Ulcers-wound care consult appreciated recommends enzymatic debridement to the right medial; top with saline; dry dressing; kerlix Non adherent; single layer over intact blisters; and open ulcerations.  Cover with dry dressings and kerlix.  4)  CKD stage - IV -Renal function appears close to patient's baseline  renally adjust medications, avoid nephrotoxic agents / dehydration  / hypotension  5) history of combined systolic and diastolic dysfunction CHF--- appears clinically compensated at this time  -Hold Lasix  6) acute hypoxic respiratory failure0--- resolved, patient back on room air  7)COPD--stable without exacerbation, continue bronchodilators  8)PAFib--stable, continue metoprolol for rate control and Eliquis for stroke prophylaxis  9)SZ--stable, continue Keppra  Disposition/Need for in-Hospital Stay- patient unable to be discharged at this time due to --Acute metabolic encephalopathy due to hypoglycemia in the patient with erratic blood sugars, needs to stop IV dextrose solution feed and see if sugar stabilized without further dextrose infusions*  Status is: Inpatient  Remains inpatient appropriate because:Please see disposition above   Disposition: The patient is from: SNF              Anticipated d/c is to: SNF              Anticipated d/c date is: 1 day              Patient currently is not medically stable to d/c. Barriers: Not Clinically Stable-  Code Status :  -  Code Status: Full Code   Family Communication:   (patient is alert, awake and coherent) Discussed with son [place] at bedside  Consults  :  na  DVT Prophylaxis  :   - SCDs  SCDs Start: 03/11/21 2307 apixaban (ELIQUIS) tablet 5 mg    Lab Results  Component Value Date   PLT 184 03/11/2021    Inpatient Medications  Scheduled Meds: . amLODipine  10 mg Oral Daily  . apixaban  5 mg Oral BID  . Chlorhexidine  Gluconate Cloth  6 each Topical Daily  . DULoxetine  60 mg Oral Daily  . levETIRAcetam  750 mg Oral BID  . metoprolol tartrate  25 mg Oral BID  . montelukast  10 mg Oral QHS  . nystatin  5 mL Oral QID  . oxybutynin  5 mg Oral QHS  . pantoprazole  40 mg Oral Daily  . rosuvastatin  10 mg Oral Daily   Continuous Infusions: . dextrose Stopped (03/12/21 0214)  . naLOXone [facility]) adult infusion for OVERDOSE Stopped (03/12/21 0902)   PRN Meds:.acetaminophen **OR** acetaminophen, albuterol, ondansetron **OR** ondansetron (ZOFRAN) IV, polyethylene glycol, umeclidinium bromide    Anti-infectives (From admission, onward)   None        Objective:   Vitals:   03/12/21 0900 03/12/21 1000 03/12/21 1100 03/12/21 1108  BP: 112/67 (!) 128/93 121/80   Pulse: 88 96 (!) 107   Resp: 16 20 (!) 21   Temp:    97.7 F (36.5 C)  TempSrc:    Oral  SpO2: 99% 100% 100%   Weight:      Height:        Wt Readings from Last 3 Encounters:  03/11/21 92.1 kg  03/09/21 92.1 kg  02/20/21 96.6 kg     Intake/Output Summary (Last 24 hours) at 03/12/2021 1745 Last data filed at 03/12/2021 1300 Gross per 24 hour  Intake 403.85 ml  Output 2350 ml  Net -1946.15 ml     Physical Exam  Gen:- Awake Alert,  In no apparent distress  HEENT:- Point.AT, No sclera icterus Neck-Supple Neck,No JVD,.  Lungs-  CTAB , fair symmetrical air movement CV- S1, S2 normal, irregular Abd-  +ve B.Sounds, Abd Soft, No tenderness,    Extremity/Skin:-  pedal pulses present, see decubitus ulcer/wounds photos in epic Psych-affect is appropriate, oriented x3 Neuro-no new focal deficits, no tremors   Data Review:   Micro Results Recent Results (from the past 240 hour(s))  Resp Panel by RT-PCR (Flu A&B, Covid) Nasopharyngeal Swab     Status: None   Collection Time: 03/09/21  7:14 PM   Specimen: Nasopharyngeal Swab; Nasopharyngeal(NP) swabs in vial transport medium  Result Value Ref Range Status   SARS Coronavirus 2 by RT  PCR NEGATIVE NEGATIVE Final    Comment: (NOTE) SARS-CoV-2 target nucleic acids are NOT DETECTED.  The SARS-CoV-2 RNA is generally detectable in upper respiratory specimens during the acute phase of infection. The lowest concentration of SARS-CoV-2 viral copies this assay can detect is 138 copies/mL. A negative result does not preclude SARS-Cov-2 infection and should not be used as the sole basis for treatment or other patient management decisions. A negative result may occur with  improper specimen collection/handling, submission of specimen other than nasopharyngeal swab, presence of viral mutation(s) within the areas targeted by this assay, and inadequate number of viral copies(<138 copies/mL). A negative result must be combined with clinical observations, patient history, and epidemiological information. The expected result  is Negative.  Fact Sheet for Patients:  EntrepreneurPulse.com.au  Fact Sheet for Healthcare Providers:  IncredibleEmployment.be  This test is no t yet approved or cleared by the [place] FDA and  has been authorized for detection and/or diagnosis of SARS-CoV-2 by FDA under an Emergency Use Authorization (EUA). This EUA will remain  in effect (meaning this test can be used) for the duration of the COVID-19 declaration under Section 564(b)(1) of the Act, 21 U.S.C.section 360bbb-3(b)(1), unless the authorization is terminated  or revoked sooner.       Influenza A by PCR NEGATIVE NEGATIVE Final   Influenza B by PCR NEGATIVE NEGATIVE Final    Comment: (NOTE) The Xpert Xpress SARS-CoV-2/FLU/RSV plus assay is intended as an aid in the diagnosis of influenza from Nasopharyngeal swab specimens and should not be used as a sole basis for treatment. Nasal washings and aspirates are unacceptable for Xpert Xpress SARS-CoV-2/FLU/RSV testing.  Fact Sheet for Patients: EntrepreneurPulse.com.au  Fact Sheet for  Healthcare Providers: IncredibleEmployment.be  This test is not yet approved or cleared by the [place] FDA and has been authorized for detection and/or diagnosis of SARS-CoV-2 by FDA under an Emergency Use Authorization (EUA). This EUA will remain in effect (meaning this test can be used) for the duration of the COVID-19 declaration under Section 564(b)(1) of the Act, 21 U.S.C. section 360bbb-3(b)(1), unless the authorization is terminated or revoked.  Performed at [facility], [address], [place], [place] [address]   Resp Panel by RT-PCR (Flu A&B, Covid) Nasopharyngeal Swab     Status: None   Collection Time: 03/11/21  8:16 PM   Specimen: Nasopharyngeal Swab; Nasopharyngeal(NP) swabs in vial transport medium  Result Value Ref Range Status   SARS Coronavirus 2 by RT PCR NEGATIVE NEGATIVE Final    Comment: (NOTE) SARS-CoV-2 target nucleic acids are NOT DETECTED.  The SARS-CoV-2 RNA is generally detectable in upper respiratory specimens during the acute phase of infection. The lowest concentration of SARS-CoV-2 viral copies this assay can detect is 138 copies/mL. A negative result does not preclude SARS-Cov-2 infection and should not be used as the sole basis for treatment or other patient management decisions. A negative result may occur with  improper specimen collection/handling, submission of specimen other than nasopharyngeal swab, presence of viral mutation(s) within the areas targeted by this assay, and inadequate number of viral copies(<138 copies/mL). A negative result must be combined with clinical observations, patient history, and epidemiological information. The expected result is Negative.  Fact Sheet for Patients:  EntrepreneurPulse.com.au  Fact Sheet for Healthcare Providers:  IncredibleEmployment.be  This test is no t yet approved or cleared by the [place] FDA and  has been authorized for  detection and/or diagnosis of SARS-CoV-2 by FDA under an Emergency Use Authorization (EUA). This EUA will remain  in effect (meaning this test can be used) for the duration of the COVID-19 declaration under Section 564(b)(1) of the Act, 21 U.S.C.section 360bbb-3(b)(1), unless the authorization is terminated  or revoked sooner.       Influenza A by PCR NEGATIVE NEGATIVE Final   Influenza B by PCR NEGATIVE NEGATIVE Final    Comment: (NOTE) The Xpert Xpress SARS-CoV-2/FLU/RSV plus assay is intended as an aid in the diagnosis of influenza from Nasopharyngeal swab specimens and should not be used as a sole basis for treatment. Nasal washings and aspirates are unacceptable for Xpert Xpress SARS-CoV-2/FLU/RSV testing.  Fact Sheet for Patients: EntrepreneurPulse.com.au  Fact Sheet for Healthcare Providers: IncredibleEmployment.be  This test is  not yet approved or cleared by the [place] and has been authorized for detection and/or diagnosis of SARS-CoV-2 by FDA under an Emergency Use Authorization (EUA). This EUA will remain in effect (meaning this test can be used) for the duration of the COVID-19 declaration under Section 564(b)(1) of the Act, 21 U.S.C. section 360bbb-3(b)(1), unless the authorization is terminated or revoked.  Performed at [facility], [address] [place], [place] [address]   MRSA PCR Screening     Status: None   Collection Time: 03/11/21 11:07 PM   Specimen: Nasal Mucosa; Nasopharyngeal  Result Value Ref Range Status   MRSA by PCR NEGATIVE NEGATIVE Final    Comment:        The GeneXpert MRSA Assay (FDA approved for NASAL specimens only), is one component of a comprehensive MRSA colonization surveillance program. It is not intended to diagnose MRSA infection nor to guide or monitor treatment for MRSA infections. Performed at [facility], [address] [place], [place] [address]     Radiology Reports CT  Head Wo Contrast  Result Date: 03/11/2021 CLINICAL DATA:  Delirium altered EXAM: CT HEAD WITHOUT CONTRAST TECHNIQUE: Contiguous axial images were obtained from the base of the skull through the vertex without intravenous contrast. COMPARISON:  CT 08/13/2020, MRI 08/15/2020 FINDINGS: Brain: No acute territorial infarction, hemorrhage, or intracranial mass. Mild atrophy. Chronic lacunar infarct within the left thalamus and left caudate. Moderate patchy white matter hypodensity consistent with chronic small vessel ischemic change. Stable ventricle size. Vascular: No hyperdense vessels.  Carotid vascular calcification Skull: Normal. Negative for fracture or focal lesion. Sinuses/Orbits: Old left medial orbital wall fracture. Mild mucosal thickening in the sinuses. Other: None IMPRESSION: 1. No CT evidence for acute intracranial abnormality. 2. Atrophy and chronic small vessel ischemic changes. Electronically Signed   By: Donavan Foil M.D.   On: 03/11/2021 17:32   DG Chest Port 1 View  Result Date: 03/09/2021 CLINICAL DATA:  Tachycardia EXAM: PORTABLE CHEST 1 VIEW COMPARISON:  December 03, 2020 FINDINGS: The lungs are clear. There is cardiomegaly with pulmonary vascularity normal. No adenopathy. No bone lesions. IMPRESSION: Cardiomegaly.  No edema or airspace opacity. Electronically Signed   By: Lowella Grip III M.D.   On: 03/09/2021 17:39     CBC Recent Labs  Lab 03/09/21 1735 03/10/21 0530 03/11/21 1654  WBC 8.6 8.0 9.7  HGB 12.8 12.6 12.1  HCT 41.3 40.5 39.7  PLT 170 180 184  MCV 96.0 95.1 96.8  MCH 29.8 29.6 29.5  MCHC 31.0 31.1 30.5  RDW 15.9* 15.5 16.1*  LYMPHSABS 0.2*  --  1.0  MONOABS 0.0*  --  0.4  EOSABS 0.0  --  0.1  BASOSABS 0.0  --  0.0    Chemistries  Recent Labs  Lab 03/09/21 1735 03/09/21 2129 03/10/21 0530 03/11/21 0739 03/11/21 1654  NA 135 133* 137 141 139  K 4.5 5.0 5.2* 5.1 4.1  CL 99 98 100 103 103  CO2 26 26 29 25 27   GLUCOSE 520* 414* 273* 101* 80   BUN 61* 61* 62* 60* 61*  CREATININE 2.14* 2.12* 2.00* 2.20* 2.13*  CALCIUM 9.0 8.2* 8.5* 8.8* 8.5*  MG  --  1.9  --   --   --   AST 12*  --  13*  --  14*  ALT 24  --  21  --  19  ALKPHOS 81  --  69  --  59  BILITOT 0.7  --  0.7  --  0.6   ------------------------------------------------------------------------------------------------------------------ No results for input(s): CHOL, HDL, LDLCALC, TRIG, CHOLHDL, LDLDIRECT in the last 72 hours.  Lab Results  Component Value Date   HGBA1C 11.1 (H) 12/03/2020   ------------------------------------------------------------------------------------------------------------------ No results for input(s): TSH, T4TOTAL, T3FREE, THYROIDAB in the last 72 hours.  Invalid input(s): FREET3 ------------------------------------------------------------------------------------------------------------------ No results for input(s): VITAMINB12, FOLATE, FERRITIN, TIBC, IRON, RETICCTPCT in the last 72 hours.  Coagulation profile No results for input(s): INR, PROTIME in the last 168 hours.  No results for input(s): DDIMER in the last 72 hours.  Cardiac Enzymes No results for input(s): CKMB, TROPONINI, MYOGLOBIN in the last 168 hours.  Invalid input(s): CK ------------------------------------------------------------------------------------------------------------------    Component Value Date/Time   BNP 87.0 03/09/2021 1735     Agam Davenport M.D on 03/12/2021 at 5:45 PM  Go to [email or web address] - for contact info  [facility] - Office  [phone]

## 2021-03-12 NOTE — Progress Notes (Signed)
Pt refused labs this morning. She also refused dressing changes to the wounds on her BLE.

## 2021-03-12 NOTE — Progress Notes (Signed)
Pt refused lab work/ blood draws, I asked pt if any, what concerns she had regarding lab draws. Pt stated  " I dont want to." Pt refused to explain further. Repeatedly stating "I dont want to." I educated pt on importance of lab work, pt stated " I don't care."

## 2021-03-12 NOTE — ED Notes (Signed)
Date and time results received: 03/12/21 2031  Test: POC glucose Critical Value: 91  Name of Provider Notified: Tinnie Gens  Orders Received? Or Actions Taken? 1amp D50 IV push stat

## 2021-03-12 NOTE — TOC Progression Note (Signed)
Transition of Care [facility]) - Progression Note    Patient Details  Name: [name] MRN: [id number] Date of Birth: [birth year]  Transition of [facility]) CM/SW Contact  Shade Flood, LCSW Phone Number: 03/12/2021, 11:21 AM  Clinical Narrative:     Pt was discharged back to long term care at [facility] yesterday and returned to the ED four hours later due to altered mental status. Anticipate return to [facility] when pt stable. TOC will follow.  Expected Discharge Plan: Long Term Nursing Home Barriers to Discharge: Continued Medical Work up  Expected Discharge Plan and Services Expected Discharge Plan: [place]                                               Social Determinants of Health (SDOH) Interventions    Readmission Risk Interventions Readmission Risk Prevention Plan 10/31/2020 08/27/2020  Transportation Screening Complete Complete  Medication Review Press photographer) Complete Complete  PCP or Specialist appointment within 3-5 days of discharge - Complete  HRI or [place] - Complete  SW Recovery Care/Counseling Consult Complete Complete  Palliative Care Screening Not Applicable Not Applicable  Skilled Nursing Facility Complete Complete  Some recent data might be hidden

## 2021-03-13 ENCOUNTER — Ambulatory Visit: Payer: Medicare Other | Admitting: Physician Assistant

## 2021-03-13 LAB — RENAL FUNCTION PANEL
Albumin: 2 g/dL — ABNORMAL LOW (ref 3.5–5.0)
Anion gap: 9 (ref 5–15)
BUN: 49 mg/dL — ABNORMAL HIGH (ref 6–20)
CO2: 27 mmol/L (ref 22–32)
Calcium: 8.1 mg/dL — ABNORMAL LOW (ref 8.9–10.3)
Chloride: 102 mmol/L (ref 98–111)
Creatinine, Ser: 1.98 mg/dL — ABNORMAL HIGH (ref 0.44–1.00)
GFR, Estimated: 29 mL/min — ABNORMAL LOW (ref >60)
Glucose, Bld: 306 mg/dL — ABNORMAL HIGH (ref 70–99)
Phosphorus: 3.3 mg/dL (ref 2.5–4.6)
Potassium: 4 mmol/L (ref 3.5–5.1)
Sodium: 138 mmol/L (ref 135–145)

## 2021-03-13 LAB — CBC
HCT: 35.5 % — ABNORMAL LOW (ref 36.0–46.0)
Hemoglobin: 10.9 g/dL — ABNORMAL LOW (ref 12.0–15.0)
MCH: 29.6 pg (ref 26.0–34.0)
MCHC: 30.7 g/dL (ref 30.0–36.0)
MCV: 96.5 fL (ref 80.0–100.0)
Platelets: 159 10*3/uL (ref 150–400)
RBC: 3.68 MIL/uL — ABNORMAL LOW (ref 3.87–5.11)
RDW: 15.9 % — ABNORMAL HIGH (ref 11.5–15.5)
WBC: 6.8 10*3/uL (ref 4.0–10.5)
nRBC: 0 % (ref 0.0–0.2)

## 2021-03-13 LAB — GLUCOSE, CAPILLARY
Glucose-Capillary: 266 mg/dL — ABNORMAL HIGH (ref 70–99)
Glucose-Capillary: 286 mg/dL — ABNORMAL HIGH (ref 70–99)
Glucose-Capillary: 299 mg/dL — ABNORMAL HIGH (ref 70–99)
Glucose-Capillary: 305 mg/dL — ABNORMAL HIGH (ref 70–99)

## 2021-03-13 MED ORDER — INSULIN GLARGINE 100 UNIT/ML ~~LOC~~ SOLN
25.0000 [IU] | Freq: Every day | SUBCUTANEOUS | Status: DC
  Administered 2021-03-13: 25 [IU] via SUBCUTANEOUS
  Filled 2021-03-13 (×2): qty 0.25

## 2021-03-13 MED ORDER — INSULIN ASPART 100 UNIT/ML IJ SOLN
4.0000 [IU] | Freq: Once | INTRAMUSCULAR | Status: AC
  Administered 2021-03-13: 4 [IU] via SUBCUTANEOUS

## 2021-03-13 MED ORDER — OXYCODONE HCL 5 MG PO TABS: 5.0000 mg | ORAL_TABLET | Freq: Three times a day (TID) | ORAL | 0 refills | Status: DC | PRN

## 2021-03-13 MED ORDER — FUROSEMIDE 40 MG PO TABS: 20.0000 mg | ORAL_TABLET | ORAL | 6 refills | Status: DC

## 2021-03-13 MED ORDER — INSULIN GLARGINE 100 UNIT/ML ~~LOC~~ SOLN: 30.0000 [IU] | Freq: Every day | SUBCUTANEOUS | 11 refills | Status: DC

## 2021-03-13 NOTE — Discharge Instructions (Signed)
1)Avoid ibuprofen/Advil/Aleve/Motrin/Goody Powders/Naproxen/BC powders/Meloxicam/Diclofenac/Indomethacin and other Nonsteroidal anti-inflammatory medications as these will make you more likely to bleed and can cause stomach ulcers, can also cause Kidney problems.   2)Repeat CBC and BMP blood test on Monday, 03/17/2021  3)Wound care  Every shift      Comments: Enzymatic debridement to the right medial; top with saline; dry dressing; kerlix Non adherent; single layer of xeroform over intact blisters; and open ulcerations.  Cover with dry dressings and kerlix. Buttock wounds; apply moisture barrier cream daily and PRN after incontinence

## 2021-03-13 NOTE — Progress Notes (Addendum)
Nsg Discharge Note  Admit Date:  03/11/2021 Discharge date: 03/13/2021   [name] to be D/C'd [place] per MD order.  AVS completed.  Copy for chart, and copy for patient signed, and dated. Patient/caregiver able to verbalize understanding.  Discharge Medication: Allergies as of 03/13/2021   No Known Allergies     Medication List    STOP taking these medications   predniSONE 50 MG tablet Commonly known as: DELTASONE     TAKE these medications   acetaminophen 325 MG tablet Commonly known as: TYLENOL Take 2 tablets (650 mg total) by mouth every 6 (six) hours as needed for headache, fever or mild pain.   albuterol 108 (90 Base) MCG/ACT inhaler Commonly known as: ProAir HFA INHALE 2 PUFFS EVERY 6 HOURS AS NEEDED FOR SHORTNESS OF BREATH/WHEEZING. What changed:   how much to take  how to take this  when to take this  reasons to take this  additional instructions   amLODipine 10 MG tablet Commonly known as: NORVASC Take 1 tablet (10 mg total) by mouth daily.   apixaban 5 MG Tabs tablet Commonly known as: Eliquis Take 1 tablet (5 mg total) by mouth 2 (two) times daily. Okay to restart Eliquis on 11/17/2020   DULoxetine 60 MG capsule Commonly known as: CYMBALTA Take 1 capsule (60 mg total) by mouth daily.   furosemide 40 MG tablet Commonly known as: Lasix Take 0.5 tablets (20 mg total) by mouth every Tuesday, Thursday, Saturday, and Sunday. What changed: when to take this   gabapentin 300 MG capsule Commonly known as: NEURONTIN TAKE 1 CAPSULE BY MOUTH THREE TIMES A DAY. What changed: See the new instructions.   insulin glargine 100 UNIT/ML injection Commonly known as: LANTUS Inject 0.3 mLs (30 Units total) into the skin daily. Start on 03/14/21 Start taking on: Mar 14, 2021 What changed:   how much to take  when to take this  additional instructions   insulin lispro 100 UNIT/ML injection Commonly known as: HUMALOG Inject  into the skin 3 (three) times daily before meals. 151-200= 1 unit, 201-250= 2 units, 251-300= 4 units, 301-350= 6 units, 351-400=8 units, 401-500= 10  units   ketoconazole 2 % cream Commonly known as: NIZORAL Apply topically 2 (two) times daily for 7 days. Apply in cheeks and perineal area with yeast rash.   levETIRAcetam 750 MG tablet Commonly known as: KEPPRA Take 1 tablet (750 mg total) by mouth 2 (two) times daily.   linagliptin 5 MG Tabs tablet Commonly known as: TRADJENTA Take 5 mg by mouth daily.   LORazepam 0.5 MG tablet Commonly known as: ATIVAN Take 1 tablet (0.5 mg total) by mouth every 8 (eight) hours as needed for anxiety.   metoprolol tartrate 25 MG tablet Commonly known as: LOPRESSOR Take 1 tablet (25 mg total) by mouth 2 (two) times daily.   montelukast 10 MG tablet Commonly known as: SINGULAIR TAKE (1) TABLET BY MOUTH AT BEDTIME. What changed: See the new instructions.   nystatin 100000 UNIT/ML suspension Commonly known as: MYCOSTATIN Take 5 mLs by mouth 4 (four) times daily.   omeprazole 20 MG capsule Commonly known as: PRILOSEC Take 20 mg by mouth daily.   oxybutynin 5 MG 24 hr tablet Commonly known as: DITROPAN-XL Take 5 mg by mouth at bedtime.   oxyCODONE 5 MG immediate release tablet Commonly known as: Oxy IR/ROXICODONE Take 1 tablet (5 mg total) by mouth 3 (three) times daily as needed for severe pain.  potassium chloride SA 20 MEQ tablet Commonly known as: KLOR-CON Take 20 mEq by mouth daily.   rosuvastatin 10 MG tablet Commonly known as: CRESTOR Take 1 tablet (10 mg total) by mouth daily.   Spiriva HandiHaler 18 MCG inhalation capsule Generic drug: tiotropium Place 18 mcg into inhaler and inhale daily as needed (Shortness of breath).   traZODone 50 MG tablet Commonly known as: DESYREL Take 1 tablet (50 mg total) by mouth at bedtime.   Trulicity 3 MP/5.3IR Sopn Generic drug: Dulaglutide Inject 3 mg into the skin See admin  instructions. Inject 0.5 mls subcutaneously one time a day every Friday   vitamin C 500 MG tablet Commonly known as: ASCORBIC ACID Take 500 mg by mouth daily.   VITAMIN D (CHOLECALCIFEROL) PO Take 5,000 Units by mouth daily.   Zinc Oxide 10 % Aero Apply 1 application topically daily.   zinc sulfate 220 (50 Zn) MG capsule Take 1 capsule (220 mg total) by mouth daily.            Discharge Care Instructions  (From admission, onward)         Start     Ordered   03/13/21 0000  Discharge wound care:       Comments: As above   03/13/21 1352          Discharge Assessment: Vitals:   03/13/21 1300 03/13/21 1400  BP: 118/88 (!) 148/89  Pulse: 86 89  Resp: 15 (!) 23  Temp:    SpO2: 97% 100%   Skin clean, dry and intact without evidence of skin break down, no evidence of skin tears noted. IV catheter discontinued intact. Site without signs and symptoms of complications - no redness or edema noted at insertion site, patient denies c/o pain - only slight tenderness at site.  Dressing with slight pressure applied.  D/c Instructions-Education: Discharge instructions given to patient/family with verbalized understanding. D/c education completed with patient/family including follow up instructions, medication list, d/c activities limitations if indicated, with other d/c instructions as indicated by MD - patient able to verbalize understanding, all questions fully answered. Patient instructed to return to ED, call 911, or call MD for any changes in condition.  Patient escorted via EMS.  Carney Corners, RN 03/13/2021 2:51 PM
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
## 2021-03-13 NOTE — Discharge Summary (Signed)
[name], is a 55 y.o. female  DOB 05/26/1966  MRN [id number].  Admission date:  03/11/2021  Admitting Physician  Bethena Roys, MD  Discharge Date:  03/13/2021   Primary MD  Caprice Renshaw, MD  Recommendations for primary care physician for things to follow:   1)Avoid ibuprofen/Advil/Aleve/Motrin/Goody Powders/Naproxen/BC powders/Meloxicam/Diclofenac/Indomethacin and other Nonsteroidal anti-inflammatory medications as these will make you more likely to bleed and can cause stomach ulcers, can also cause Kidney problems.   2)Repeat CBC and BMP blood test on Monday, 03/17/2021  3)Wound care  Every shift      Comments: Enzymatic debridement to the right medial; top with saline; dry dressing; kerlix Non adherent; single layer of xeroform over intact blisters; and open ulcerations.  Cover with dry dressings and kerlix. Buttock wounds; apply moisture barrier cream daily and PRN after incontinence   Admission Diagnosis  Metabolic encephalopathy [A83.41] Hypoglycemia [E16.2] Altered mental status, unspecified altered mental status type [R41.82] AMS (altered mental status) [R41.82]   Discharge Diagnosis  Metabolic encephalopathy [D62.22] Hypoglycemia [E16.2] Altered mental status, unspecified altered mental status type [R41.82] AMS (altered mental status) [R41.82]   Principal Problem:   Acute metabolic encephalopathy Active Problems:   CKD (chronic kidney disease) stage 3, GFR 30-59 ml/min (HCC)   Chronic combined systolic and diastolic CHF (congestive heart failure) (HCC)   Medical non-compliance   Severe recurrent major depression without psychotic features ([place])   Polysubstance abuse (including cocaine)   Atrial fibrillation, controlled ([place])      Past Medical History:  Diagnosis Date  . Alcohol use   . Ankle fracture, lateral malleolus, closed 2013  . Anxiety   . Aortic  atherosclerosis ([place]) 03/10/2021  . Breast mass, left 2013  . CHF (congestive heart failure) ([place])    a. EF 20-25% by echo in 05/2016 with cath showing normal cors b. EF 50-55% in 07/2020 c. 01/2021: EF at 55-60% with moderate LVH  . Chronic anemia   . CKD (chronic kidney disease), stage III ([place])   . Cocaine abuse ([place])   . COPD (chronic obstructive pulmonary disease) ([place])   . Diabetes mellitus, type 2 ([place])   . Diabetic Charcot foot ([place])   . Essential hypertension   . History of cardiomyopathy   . History of GI bleed 2011  . Hyperlipidemia   . Noncompliance   . Obesity   . PAF (paroxysmal atrial fibrillation) ([place])   . Panic attacks   . PAT (paroxysmal atrial tachycardia) (HCC)    Previously on Amiodarone  . Seizures ([place]) 10/01/2020  . Sleep apnea    Not on CPAP  . Stroke ([place]) 2018  . Tobacco abuse   . Urinary incontinence     Past Surgical History:  Procedure Laterality Date  . BIOPSY  11/12/2020   Procedure: BIOPSY;  Surgeon: Harvel Quale, MD;  [facility];  Service: Gastroenterology;;  . BREAST BIOPSY    . CARDIAC CATHETERIZATION N/A 07/28/2016   Procedure: Left Heart Cath and Coronary Angiography;  Surgeon: Conception Oms  Hassell Done, MD;  Location: [facility];  Service: Cardiovascular;  Laterality: N/A;  . COLONOSCOPY N/A 05/10/2019   Procedure: COLONOSCOPY;  Surgeon: Danie Binder, MD;  [facility];  Service: Endoscopy;  Laterality: N/A;  Phenergan 12.5 mg IV in pre-op  . DILATION AND CURETTAGE OF UTERUS    . ESOPHAGOGASTRODUODENOSCOPY (EGD) WITH PROPOFOL N/A 11/12/2020   Procedure: ESOPHAGOGASTRODUODENOSCOPY (EGD) WITH PROPOFOL;  Surgeon: Harvel Quale, MD;  [facility];  Service: Gastroenterology;  Laterality: N/A;  . I & D EXTREMITY Bilateral 09/22/2017   Procedure: BILATERAL DEBRIDEMENT LEG/FOOT ULCERS, APPLY VERAFLO WOUND VAC;  Surgeon: Newt Minion, MD;  Location: [facility];  Service: Orthopedics;   Laterality: Bilateral;  . I & D EXTREMITY Right 10/11/2018   Procedure: IRRIGATION AND DEBRIDEMENT RIGHT HAND;  Surgeon: [name], MD;  Location: [facility];  Service: Orthopedics;  Laterality: Right;  . I & D EXTREMITY Right 10/13/2018   Procedure: REPEAT IRRIGATION AND DEBRIDEMENT RIGHT HAND;  Surgeon: [name], MD;  Location: [facility];  Service: Orthopedics;  Laterality: Right;  . I & D EXTREMITY Right 11/22/2018   Procedure: IRRIGATION AND DEBRIDEMENT AND PINNING RIGHT HAND;  Surgeon: [name], MD;  Location: [facility];  Service: Orthopedics;  Laterality: Right;  . IR RADIOLOGIST EVAL & MGMT  07/05/2018  . POLYPECTOMY  05/10/2019   Procedure: POLYPECTOMY;  Surgeon: Danie Binder, MD;  [facility];  Service: Endoscopy;;  . SKIN SPLIT GRAFT Bilateral 09/28/2017   Procedure: BILATERAL SPLIT THICKNESS SKIN GRAFT LEGS/FEET AND APPLY VAC;  Surgeon: Newt Minion, MD;  Location: [facility];  Service: Orthopedics;  Laterality: Bilateral;  . SKIN SPLIT GRAFT Right 11/22/2018   Procedure: SKIN GRAFT SPLIT THICKNESS;  Surgeon: [name], MD;  Location: [facility];  Service: Orthopedics;  Laterality: Right;     HPI  from the history and physical done on the day of admission:   Chief Complaint: AMS  HPI: [name] is a 55 y.o. female with medical history significant for systolic and diastolic CHF, COPD, diabetes mellitus, atrial fibrillation, seizures, polysubstance abuse, CKD 3, hypertension. Patient was brought to the ED reports of altered mental status.   Patient was just discharged from the hospital today at about noon after hospitalization 5/15-5/17 for acute on chronic kidney disease treated with IV fluids, and open bilateral lateral lower extremity wounds without signs of active infection treated with oral fluconazole and nystatin, also hyperglycemia blood glucose of 520 on admission. Per nursing home, patient went out to smoke, came in with a change in mental status.   Patient had received her dose of oxycodone, Ativan was given, also 25u of lantus.  En route patient's mental status continued to decline.  ED Course: Temperature 97.8.  Heart rate 80s.  Respiratory rate 11-13.  Blood pressure systolic [id number] to 0000000.  O2 sats 96% on room air. Patient was obtunded on arrival, responding only to sternal rub and fall back asleep, 0.4 mg Narcan given with improvement in mental status, patient opened her eyes responded to voice and was able to move her extremities on command.  Blood sugars in the 80s, she also received 67mls D50. Later mental status declined again, blood glucose was checked and was 44.  1 amp of D50 given.  With improvement in mental status.  Review of Systems: As per HPI all other systems reviewed and negative      Hospital Course:   Brief Summary:- 55 y.o.femalewith  medical history significant forsystolic and diastolic CHF, COPD, diabetes mellitus, atrial fibrillation, seizures, prior h/o polysubstance abuse, CKD 3, hypertension readmitted on 03/11/2021 after being discharged from the hospital with altered mentation and found to have hypoglycemia  A/p 1) acute metabolic encephalopathy suspect mostly due to hypoglycemia with some component of oxycodone/opiate related lethargy -Resolved, mentation back to baseline, -Patient is off Narcan and off dextrose infusion -Mentation and speech as per son who is at bedside appears back to baseline  2)DM2-A1c 11.1 reflecting uncontrolled DM with hyperglycemia -However patient was admitted this time with hypoglycemia with blood sugar down to 44 after receiving regular insulin -During recent hospitalization patient blood sugars were over 500 -Overall patient blood sugars are difficult to control she goes high and then she goes low  3)Bil LE Decubitus Ulcers-wound care consult appreciated recommends enzymatic debridement to the right medial; top with saline; dry dressing; kerlix Non adherent; single  layer over intact blisters; and open ulcerations.  Cover with dry dressings and kerlix.  4)  CKD stage - IV -Renal function appears close to patient's baseline  renally adjust medications, avoid nephrotoxic agents / dehydration  / hypotension  5) history of combined systolic and diastolic dysfunction CHF--- appears clinically compensated at this time  -Okay to give Lasix 20 mg daily starting Saturdays and Sundays  6) acute hypoxic respiratory failure0--- resolved, patient back on room air -No further hypoxia  7)COPD--stable without exacerbation, continue bronchodilators  8)PAFib--stable, continue metoprolol for rate control and Eliquis for stroke prophylaxis  9)SZ--stable, continue Keppra  Disposition-back to SNF rehab  Disposition: The patient is from: SNF  Anticipated d/c is to: SNF   Code Status :  -  Code Status: Full Code   Family Communication:   (patient is alert, awake and coherent) Discussed with son Kellie Moor  Consults  :  na   Discharge Condition: Stable  Follow UP   Follow-up Information    Go to  [facility]   Specialty: Emergency Medicine Why: If symptoms worsen Contact information: [address] [id number] [address]              Diet and Activity recommendation:  As advised  Discharge Instructions    Discharge Instructions    Call MD for:  difficulty breathing, headache or visual disturbances   Complete by: As directed    Call MD for:  persistant dizziness or light-headedness   Complete by: As directed    Call MD for:  persistant nausea and vomiting   Complete by: As directed    Call MD for:  redness, tenderness, or signs of infection (pain, swelling, redness, odor or green/yellow discharge around incision site)   Complete by: As directed    Call MD for:  severe uncontrolled pain   Complete by: As directed    Call MD for:  temperature >100.4    Complete by: As directed    Diet Carb Modified   Complete by: As directed    Discharge instructions   Complete by: As directed    1)Avoid ibuprofen/Advil/Aleve/Motrin/Goody Powders/Naproxen/BC powders/Meloxicam/Diclofenac/Indomethacin and other Nonsteroidal anti-inflammatory medications as these will make you more likely to bleed and can cause stomach ulcers, can also cause Kidney problems.   2)Repeat CBC and BMP blood test on Monday, 03/17/2021  3)Wound care  Every shift      Comments: Enzymatic debridement to the right medial; top with saline; dry dressing; kerlix Non adherent; single layer of xeroform over intact blisters; and open  ulcerations.  Cover with dry dressings and kerlix. Buttock wounds; apply moisture barrier cream daily and PRN after incontinence   Discharge wound care:   Complete by: As directed    As above   Increase activity slowly   Complete by: As directed         Discharge Medications     Allergies as of 03/13/2021   No Known Allergies     Medication List    STOP taking these medications   predniSONE 50 MG tablet Commonly known as: DELTASONE     TAKE these medications   acetaminophen 325 MG tablet Commonly known as: TYLENOL Take 2 tablets (650 mg total) by mouth every 6 (six) hours as needed for headache, fever or mild pain.   albuterol 108 (90 Base) MCG/ACT inhaler Commonly known as: ProAir HFA INHALE 2 PUFFS EVERY 6 HOURS AS NEEDED FOR SHORTNESS OF BREATH/WHEEZING. What changed:   how much to take  how to take this  when to take this  reasons to take this  additional instructions   amLODipine 10 MG tablet Commonly known as: NORVASC Take 1 tablet (10 mg total) by mouth daily.   apixaban 5 MG Tabs tablet Commonly known as: Eliquis Take 1 tablet (5 mg total) by mouth 2 (two) times daily. Okay to restart Eliquis on 11/17/2020   DULoxetine 60 MG capsule Commonly known as: CYMBALTA Take 1 capsule (60 mg total) by mouth daily.    furosemide 40 MG tablet Commonly known as: Lasix Take 0.5 tablets (20 mg total) by mouth every Tuesday, Thursday, Saturday, and Sunday. What changed: when to take this   gabapentin 300 MG capsule Commonly known as: NEURONTIN TAKE 1 CAPSULE BY MOUTH THREE TIMES A DAY. What changed: See the new instructions.   insulin glargine 100 UNIT/ML injection Commonly known as: LANTUS Inject 0.3 mLs (30 Units total) into the skin daily. Start on 03/14/21 Start taking on: Mar 14, 2021 What changed:   how much to take  when to take this  additional instructions   insulin lispro 100 UNIT/ML injection Commonly known as: HUMALOG Inject into the skin 3 (three) times daily before meals. 151-200= 1 unit, 201-250= 2 units, 251-300= 4 units, 301-350= 6 units, 351-400=8 units, 401-500= 10  units   ketoconazole 2 % cream Commonly known as: NIZORAL Apply topically 2 (two) times daily for 7 days. Apply in cheeks and perineal area with yeast rash.   levETIRAcetam 750 MG tablet Commonly known as: KEPPRA Take 1 tablet (750 mg total) by mouth 2 (two) times daily.   linagliptin 5 MG Tabs tablet Commonly known as: TRADJENTA Take 5 mg by mouth daily.   LORazepam 0.5 MG tablet Commonly known as: ATIVAN Take 1 tablet (0.5 mg total) by mouth every 8 (eight) hours as needed for anxiety.   metoprolol tartrate 25 MG tablet Commonly known as: LOPRESSOR Take 1 tablet (25 mg total) by mouth 2 (two) times daily.   montelukast 10 MG tablet Commonly known as: SINGULAIR TAKE (1) TABLET BY MOUTH AT BEDTIME. What changed: See the new instructions.   nystatin 100000 UNIT/ML suspension Commonly known as: MYCOSTATIN Take 5 mLs by mouth 4 (four) times daily.   omeprazole 20 MG capsule Commonly known as: PRILOSEC Take 20 mg by mouth daily.   oxybutynin 5 MG 24 hr tablet Commonly known as: DITROPAN-XL Take 5 mg by mouth at bedtime.   oxyCODONE 5 MG immediate release tablet Commonly known as: Oxy  IR/ROXICODONE Take 1 tablet (5 mg  total) by mouth 3 (three) times daily as needed for severe pain.   potassium chloride SA 20 MEQ tablet Commonly known as: KLOR-CON Take 20 mEq by mouth daily.   rosuvastatin 10 MG tablet Commonly known as: CRESTOR Take 1 tablet (10 mg total) by mouth daily.   Spiriva HandiHaler 18 MCG inhalation capsule Generic drug: tiotropium Place 18 mcg into inhaler and inhale daily as needed (Shortness of breath).   traZODone 50 MG tablet Commonly known as: DESYREL Take 1 tablet (50 mg total) by mouth at bedtime.   Trulicity 3 DV/7.6HY Sopn Generic drug: Dulaglutide Inject 3 mg into the skin See admin instructions. Inject 0.5 mls subcutaneously one time a day every Friday   vitamin C 500 MG tablet Commonly known as: ASCORBIC ACID Take 500 mg by mouth daily.   VITAMIN D (CHOLECALCIFEROL) PO Take 5,000 Units by mouth daily.   Zinc Oxide 10 % Aero Apply 1 application topically daily.   zinc sulfate 220 (50 Zn) MG capsule Take 1 capsule (220 mg total) by mouth daily.            Discharge Care Instructions  (From admission, onward)         Start     Ordered   03/13/21 0000  Discharge wound care:       Comments: As above   03/13/21 1352          Major procedures and Radiology Reports - PLEASE review detailed and final reports for all details, in brief -   CT Head Wo Contrast  Result Date: 03/11/2021 CLINICAL DATA:  Delirium altered EXAM: CT HEAD WITHOUT CONTRAST TECHNIQUE: Contiguous axial images were obtained from the base of the skull through the vertex without intravenous contrast. COMPARISON:  CT 08/13/2020, MRI 08/15/2020 FINDINGS: Brain: No acute territorial infarction, hemorrhage, or intracranial mass. Mild atrophy. Chronic lacunar infarct within the left thalamus and left caudate. Moderate patchy white matter hypodensity consistent with chronic small vessel ischemic change. Stable ventricle size. Vascular: No hyperdense vessels.   Carotid vascular calcification Skull: Normal. Negative for fracture or focal lesion. Sinuses/Orbits: Old left medial orbital wall fracture. Mild mucosal thickening in the sinuses. Other: None IMPRESSION: 1. No CT evidence for acute intracranial abnormality. 2. Atrophy and chronic small vessel ischemic changes. Electronically Signed   By: Donavan Foil M.D.   On: 03/11/2021 17:32   DG Chest Port 1 View  Result Date: 03/09/2021 CLINICAL DATA:  Tachycardia EXAM: PORTABLE CHEST 1 VIEW COMPARISON:  December 03, 2020 FINDINGS: The lungs are clear. There is cardiomegaly with pulmonary vascularity normal. No adenopathy. No bone lesions. IMPRESSION: Cardiomegaly.  No edema or airspace opacity. Electronically Signed   By: Lowella Grip III M.D.   On: 03/09/2021 17:39    Micro Results   Recent Results (from the past 240 hour(s))  Resp Panel by RT-PCR (Flu A&B, Covid) Nasopharyngeal Swab     Status: None   Collection Time: 03/09/21  7:14 PM   Specimen: Nasopharyngeal Swab; Nasopharyngeal(NP) swabs in vial transport medium  Result Value Ref Range Status   SARS Coronavirus 2 by RT PCR NEGATIVE NEGATIVE Final    Comment: (NOTE) SARS-CoV-2 target nucleic acids are NOT DETECTED.  The SARS-CoV-2 RNA is generally detectable in upper respiratory specimens during the acute phase of infection. The lowest concentration of SARS-CoV-2 viral copies this assay can detect is 138 copies/mL. A negative result does not preclude SARS-Cov-2 infection and should not be used as the sole basis for treatment or other  patient management decisions. A negative result may occur with  improper specimen collection/handling, submission of specimen other than nasopharyngeal swab, presence of viral mutation(s) within the areas targeted by this assay, and inadequate number of viral copies(<138 copies/mL). A negative result must be combined with clinical observations, patient history, and epidemiological information. The expected  result is Negative.  Fact Sheet for Patients:  EntrepreneurPulse.com.au  Fact Sheet for Healthcare Providers:  IncredibleEmployment.be  This test is no t yet approved or cleared by the [place] FDA and  has been authorized for detection and/or diagnosis of SARS-CoV-2 by FDA under an Emergency Use Authorization (EUA). This EUA will remain  in effect (meaning this test can be used) for the duration of the COVID-19 declaration under Section 564(b)(1) of the Act, 21 U.S.C.section 360bbb-3(b)(1), unless the authorization is terminated  or revoked sooner.       Influenza A by PCR NEGATIVE NEGATIVE Final   Influenza B by PCR NEGATIVE NEGATIVE Final    Comment: (NOTE) The Xpert Xpress SARS-CoV-2/FLU/RSV plus assay is intended as an aid in the diagnosis of influenza from Nasopharyngeal swab specimens and should not be used as a sole basis for treatment. Nasal washings and aspirates are unacceptable for Xpert Xpress SARS-CoV-2/FLU/RSV testing.  Fact Sheet for Patients: EntrepreneurPulse.com.au  Fact Sheet for Healthcare Providers: IncredibleEmployment.be  This test is not yet approved or cleared by the [place] FDA and has been authorized for detection and/or diagnosis of SARS-CoV-2 by FDA under an Emergency Use Authorization (EUA). This EUA will remain in effect (meaning this test can be used) for the duration of the COVID-19 declaration under Section 564(b)(1) of the Act, 21 U.S.C. section 360bbb-3(b)(1), unless the authorization is terminated or revoked.  Performed at [facility], [address], [place], [place] [address]   Resp Panel by RT-PCR (Flu A&B, Covid) Nasopharyngeal Swab     Status: None   Collection Time: 03/11/21  8:16 PM   Specimen: Nasopharyngeal Swab; Nasopharyngeal(NP) swabs in vial transport medium  Result Value Ref Range Status   SARS Coronavirus 2 by RT PCR NEGATIVE NEGATIVE  Final    Comment: (NOTE) SARS-CoV-2 target nucleic acids are NOT DETECTED.  The SARS-CoV-2 RNA is generally detectable in upper respiratory specimens during the acute phase of infection. The lowest concentration of SARS-CoV-2 viral copies this assay can detect is 138 copies/mL. A negative result does not preclude SARS-Cov-2 infection and should not be used as the sole basis for treatment or other patient management decisions. A negative result may occur with  improper specimen collection/handling, submission of specimen other than nasopharyngeal swab, presence of viral mutation(s) within the areas targeted by this assay, and inadequate number of viral copies(<138 copies/mL). A negative result must be combined with clinical observations, patient history, and epidemiological information. The expected result is Negative.  Fact Sheet for Patients:  EntrepreneurPulse.com.au  Fact Sheet for Healthcare Providers:  IncredibleEmployment.be  This test is no t yet approved or cleared by the [place] FDA and  has been authorized for detection and/or diagnosis of SARS-CoV-2 by FDA under an Emergency Use Authorization (EUA). This EUA will remain  in effect (meaning this test can be used) for the duration of the COVID-19 declaration under Section 564(b)(1) of the Act, 21 U.S.C.section 360bbb-3(b)(1), unless the authorization is terminated  or revoked sooner.       Influenza A by PCR NEGATIVE NEGATIVE Final   Influenza B by PCR NEGATIVE NEGATIVE Final    Comment: (NOTE) The Xpert Xpress SARS-CoV-2/FLU/RSV  plus assay is intended as an aid in the diagnosis of influenza from Nasopharyngeal swab specimens and should not be used as a sole basis for treatment. Nasal washings and aspirates are unacceptable for Xpert Xpress SARS-CoV-2/FLU/RSV testing.  Fact Sheet for Patients: BloggerCourse.com  Fact Sheet for Healthcare  Providers: SeriousBroker.it  This test is not yet approved or cleared by the [place] FDA and has been authorized for detection and/or diagnosis of SARS-CoV-2 by FDA under an Emergency Use Authorization (EUA). This EUA will remain in effect (meaning this test can be used) for the duration of the COVID-19 declaration under Section 564(b)(1) of the Act, 21 U.S.C. section 360bbb-3(b)(1), unless the authorization is terminated or revoked.  Performed at [facility], [address] [place], [place] [address]   MRSA PCR Screening     Status: None   Collection Time: 03/11/21 11:07 PM   Specimen: Nasal Mucosa; Nasopharyngeal  Result Value Ref Range Status   MRSA by PCR NEGATIVE NEGATIVE Final    Comment:        The GeneXpert MRSA Assay (FDA approved for NASAL specimens only), is one component of a comprehensive MRSA colonization surveillance program. It is not intended to diagnose MRSA infection nor to guide or monitor treatment for MRSA infections. Performed at [facility], [address], [place], [place] [address]        Today   Subjective    [name] today has no new complaints eating and drinking well,   -Had 1 episode of possible elevated temp overnight otherwise no fevers no chills       Patient has been seen and examined prior to discharge   Objective   Blood pressure 118/88, pulse 86, temperature 98.7 F (37.1 C), temperature source Oral, resp. rate 15, height 5\' 1"  (1.549 m), weight 91 kg, last menstrual period 05/19/2016, SpO2 97 %.   Intake/Output Summary (Last 24 hours) at 03/13/2021 1354 Last data filed at 03/13/2021 1300 Gross per 24 hour  Intake 800 ml  Output 1250 ml  Net -450 ml    Exam Gen:- Awake Alert,  In no apparent distress  HEENT:- [place].AT, No sclera icterus Neck-Supple Neck,No JVD,.  Lungs-  CTAB , fair symmetrical air movement CV- S1, S2 normal, irregular Abd-  +ve B.Sounds, Abd Soft, No tenderness,     Extremity/Skin:- +ve Pedal pulses, see decubitus ulcer/wounds photos  below Psych-affect is appropriate, oriented x3 Neuro-generalized weakness, no new focal deficits, no tremors - Media Information         Document Information  Photos  Rt medial leg  03/12/2021 10:28  Attached To:  Hospital Encounter on 03/11/21   Source Information  Shon Hale, MD  [facility]    Media Information         Document Information  Photos  Lt leg  03/12/2021 10:27  Attached To:  Hospital Encounter on 03/11/21   Source Information  Shon Hale, MD  [facility]       Data Review   CBC w Diff:  Lab Results  Component Value Date   WBC 6.8 03/13/2021   HGB 10.9 (L) 03/13/2021   HCT 35.5 (L) 03/13/2021   PLT 159 03/13/2021   LYMPHOPCT 10 03/11/2021   MONOPCT 4 03/11/2021   EOSPCT 1 03/11/2021   BASOPCT 0 03/11/2021    CMP:  Lab Results  Component Value Date   NA 138 03/13/2021   K 4.0 03/13/2021   CL 102 03/13/2021   CO2 27 03/13/2021  BUN 49 (H) 03/13/2021   CREATININE 1.98 (H) 03/13/2021   CREATININE 1.56 (H) 07/26/2018   PROT 5.7 (L) 03/11/2021   ALBUMIN 2.0 (L) 03/13/2021   BILITOT 0.6 03/11/2021   ALKPHOS 59 03/11/2021   AST 14 (L) 03/11/2021   ALT 19 03/11/2021  .   Total Discharge time is about 33 minutes  Roxan Hockey M.D on 03/13/2021 at 1:54 PM  Go to [email or web address] -  for contact info  [facility] - Office  [phone]

## 2021-03-13 NOTE — Progress Notes (Signed)
Report called and given to Nurse at [facility].

## 2021-03-13 NOTE — TOC Transition Note (Signed)
Transition of Care [facility]) - CM/SW Discharge Note   Patient Details  Name: [name] MRN: [id number] Date of Birth: [birth year]  Transition of Care [facility]) CM/SW Contact:  Shade Flood, LCSW Phone Number: 03/13/2021, 2:02 PM   Clinical Narrative:     Pt stable for dc back to [facility]. Updated [name] at [facility] and they can take pt today. RN to call report. EMS to transport.  No other TOC needs for dc.  Final next level of care: Long Term Nursing Home Barriers to Discharge: Barriers Resolved   Patient Goals and CMS Choice        Discharge Placement                       Discharge Plan and Services                                     Social Determinants of Health (SDOH) Interventions     Readmission Risk Interventions Readmission Risk Prevention Plan 10/31/2020 08/27/2020  Transportation Screening Complete Complete  Medication Review Press photographer) Complete Complete  PCP or Specialist appointment within 3-5 days of discharge - Complete  HRI or [place] - Complete  SW Recovery Care/Counseling Consult Complete Complete  Palliative Care Screening Not Applicable Not Applicable  Skilled Nursing Facility Complete Complete  Some recent data might be hidden

## 2021-03-14 ENCOUNTER — Emergency Department (HOSPITAL_COMMUNITY)
Admission: EM | Admit: 2021-03-14 | Discharge: 2021-03-15 | Disposition: A | Discharge status: Home / Self Care | Payer: Medicare Other | Attending: Emergency Medicine | Admitting: Emergency Medicine

## 2021-03-14 ENCOUNTER — Other Ambulatory Visit: Payer: Self-pay

## 2021-03-14 ENCOUNTER — Encounter (HOSPITAL_COMMUNITY): Payer: Self-pay | Admitting: Emergency Medicine

## 2021-03-14 DIAGNOSIS — Z79899 Other long term (current) drug therapy: Secondary | ICD-10-CM | POA: Insufficient documentation

## 2021-03-14 DIAGNOSIS — F17210 Nicotine dependence, cigarettes, uncomplicated: Secondary | ICD-10-CM | POA: Insufficient documentation

## 2021-03-14 DIAGNOSIS — Z794 Long term (current) use of insulin: Secondary | ICD-10-CM | POA: Insufficient documentation

## 2021-03-14 DIAGNOSIS — E1110 Type 2 diabetes mellitus with ketoacidosis without coma: Secondary | ICD-10-CM | POA: Diagnosis not present

## 2021-03-14 DIAGNOSIS — D649 Anemia, unspecified: Secondary | ICD-10-CM | POA: Diagnosis not present

## 2021-03-14 DIAGNOSIS — E1122 Type 2 diabetes mellitus with diabetic chronic kidney disease: Secondary | ICD-10-CM | POA: Diagnosis not present

## 2021-03-14 DIAGNOSIS — I4891 Unspecified atrial fibrillation: Secondary | ICD-10-CM | POA: Insufficient documentation

## 2021-03-14 DIAGNOSIS — R52 Pain, unspecified: Secondary | ICD-10-CM | POA: Diagnosis not present

## 2021-03-14 DIAGNOSIS — J449 Chronic obstructive pulmonary disease, unspecified: Secondary | ICD-10-CM | POA: Insufficient documentation

## 2021-03-14 DIAGNOSIS — I130 Hypertensive heart and chronic kidney disease with heart failure and stage 1 through stage 4 chronic kidney disease, or unspecified chronic kidney disease: Secondary | ICD-10-CM | POA: Diagnosis not present

## 2021-03-14 DIAGNOSIS — N183 Chronic kidney disease, stage 3 unspecified: Secondary | ICD-10-CM | POA: Insufficient documentation

## 2021-03-14 DIAGNOSIS — I5042 Chronic combined systolic (congestive) and diastolic (congestive) heart failure: Secondary | ICD-10-CM | POA: Diagnosis not present

## 2021-03-14 DIAGNOSIS — Z8616 Personal history of COVID-19: Secondary | ICD-10-CM | POA: Insufficient documentation

## 2021-03-14 DIAGNOSIS — R310 Gross hematuria: Secondary | ICD-10-CM | POA: Insufficient documentation

## 2021-03-14 DIAGNOSIS — Z7901 Long term (current) use of anticoagulants: Secondary | ICD-10-CM | POA: Insufficient documentation

## 2021-03-14 DIAGNOSIS — R0902 Hypoxemia: Secondary | ICD-10-CM | POA: Diagnosis not present

## 2021-03-14 DIAGNOSIS — R319 Hematuria, unspecified: Secondary | ICD-10-CM | POA: Diagnosis not present

## 2021-03-14 DIAGNOSIS — Z743 Need for continuous supervision: Secondary | ICD-10-CM | POA: Diagnosis not present

## 2021-03-14 LAB — URINALYSIS, ROUTINE W REFLEX MICROSCOPIC
Bacteria, UA: NONE SEEN
Bilirubin Urine: NEGATIVE
Glucose, UA: 50 mg/dL — AB
Ketones, ur: NEGATIVE mg/dL
Nitrite: NEGATIVE
Protein, ur: 100 mg/dL — AB
RBC / HPF: >50 RBC/hpf — ABNORMAL HIGH (ref 0–5)
Specific Gravity, Urine: 1.012 (ref 1.005–1.030)
WBC, UA: >50 WBC/hpf — ABNORMAL HIGH (ref 0–5)
pH: 6 (ref 5.0–8.0)

## 2021-03-14 LAB — BASIC METABOLIC PANEL
Anion gap: 9 (ref 5–15)
BUN: 45 mg/dL — ABNORMAL HIGH (ref 6–20)
CO2: 27 mmol/L (ref 22–32)
Calcium: 8.4 mg/dL — ABNORMAL LOW (ref 8.9–10.3)
Chloride: 105 mmol/L (ref 98–111)
Creatinine, Ser: 2 mg/dL — ABNORMAL HIGH (ref 0.44–1.00)
GFR, Estimated: 29 mL/min — ABNORMAL LOW (ref >60)
Glucose, Bld: 304 mg/dL — ABNORMAL HIGH (ref 70–99)
Potassium: 4.2 mmol/L (ref 3.5–5.1)
Sodium: 141 mmol/L (ref 135–145)

## 2021-03-14 LAB — CBC WITH DIFFERENTIAL/PLATELET
Abs Immature Granulocytes: 0.03 10*3/uL (ref 0.00–0.07)
Basophils Absolute: 0 10*3/uL (ref 0.0–0.1)
Basophils Relative: 0 %
Eosinophils Absolute: 0.1 10*3/uL (ref 0.0–0.5)
Eosinophils Relative: 1 %
HCT: 33 % — ABNORMAL LOW (ref 36.0–46.0)
Hemoglobin: 9.8 g/dL — ABNORMAL LOW (ref 12.0–15.0)
Immature Granulocytes: 1 %
Lymphocytes Relative: 17 %
Lymphs Abs: 1.1 10*3/uL (ref 0.7–4.0)
MCH: 29.1 pg (ref 26.0–34.0)
MCHC: 29.7 g/dL — ABNORMAL LOW (ref 30.0–36.0)
MCV: 97.9 fL (ref 80.0–100.0)
Monocytes Absolute: 0.4 10*3/uL (ref 0.1–1.0)
Monocytes Relative: 6 %
Neutro Abs: 4.7 10*3/uL (ref 1.7–7.7)
Neutrophils Relative %: 75 %
Platelets: 193 10*3/uL (ref 150–400)
RBC: 3.37 MIL/uL — ABNORMAL LOW (ref 3.87–5.11)
RDW: 15.9 % — ABNORMAL HIGH (ref 11.5–15.5)
WBC: 6.4 10*3/uL (ref 4.0–10.5)
nRBC: 0 % (ref 0.0–0.2)

## 2021-03-14 MED ORDER — CEPHALEXIN 500 MG PO CAPS: 500.0000 mg | ORAL_CAPSULE | Freq: Two times a day (BID) | ORAL | 0 refills | Status: DC

## 2021-03-14 MED ORDER — CEPHALEXIN 500 MG PO CAPS
500.0000 mg | ORAL_CAPSULE | Freq: Once | ORAL | Status: AC
  Administered 2021-03-14: 500 mg via ORAL
  Filled 2021-03-14: qty 1

## 2021-03-14 NOTE — Discharge Instructions (Addendum)
You do have a great amount of blood in your urine today, it is possible it is from a urinary tract infection and so you are being treated with an antibiotic.  However you will need follow-up care with the urologist for further evaluation of your symptoms.  Call Dr. Alyson Ingles for follow-up care.  In the interim we want you to hold your Eliquis until Monday.  On Monday let your primary doctor decide whether you need to start taking your Eliquis again, this may be complicating the blood loss from your bladder.  A urine culture has been started here to help better determine if there is an infection involved with your symptoms.

## 2021-03-14 NOTE — ED Notes (Signed)
Called For EMS transport back to [facility]

## 2021-03-14 NOTE — ED Notes (Signed)
Pt refusing lab work at this time. Almyra Free, [place] notified.

## 2021-03-14 NOTE — ED Triage Notes (Signed)
Pt from [facility]. Staff reports pt has gross hematuria. Pt denies pain at this time.

## 2021-03-14 NOTE — ED Notes (Signed)
Called [facility] said she is on list to go back to nursing home
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
## 2021-03-15 NOTE — ED Notes (Signed)
Pt has been given 2 grape juices x 3 by this nurse since 11 pm.However continues to scream out and curse staff due to "being ignored". Pt called 911 from room.

## 2021-03-15 NOTE — ED Notes (Signed)
Pt cleaned due to incontinence at this time.  

## 2021-03-16 LAB — URINE CULTURE: Culture: NO GROWTH

## 2021-03-17 DIAGNOSIS — I4891 Unspecified atrial fibrillation: Secondary | ICD-10-CM | POA: Diagnosis not present

## 2021-03-17 DIAGNOSIS — I10 Essential (primary) hypertension: Secondary | ICD-10-CM | POA: Diagnosis not present

## 2021-03-17 DIAGNOSIS — R269 Unspecified abnormalities of gait and mobility: Secondary | ICD-10-CM | POA: Diagnosis not present

## 2021-03-17 DIAGNOSIS — K219 Gastro-esophageal reflux disease without esophagitis: Secondary | ICD-10-CM | POA: Diagnosis not present

## 2021-03-17 DIAGNOSIS — M6281 Muscle weakness (generalized): Secondary | ICD-10-CM | POA: Diagnosis not present

## 2021-03-17 DIAGNOSIS — J449 Chronic obstructive pulmonary disease, unspecified: Secondary | ICD-10-CM | POA: Diagnosis not present

## 2021-03-17 DIAGNOSIS — E119 Type 2 diabetes mellitus without complications: Secondary | ICD-10-CM | POA: Diagnosis not present

## 2021-03-17 DIAGNOSIS — I639 Cerebral infarction, unspecified: Secondary | ICD-10-CM | POA: Diagnosis not present

## 2021-03-17 DIAGNOSIS — E785 Hyperlipidemia, unspecified: Secondary | ICD-10-CM | POA: Diagnosis not present

## 2021-03-17 DIAGNOSIS — R600 Localized edema: Secondary | ICD-10-CM | POA: Diagnosis not present

## 2021-03-17 DIAGNOSIS — I130 Hypertensive heart and chronic kidney disease with heart failure and stage 1 through stage 4 chronic kidney disease, or unspecified chronic kidney disease: Secondary | ICD-10-CM | POA: Diagnosis not present

## 2021-03-17 DIAGNOSIS — J441 Chronic obstructive pulmonary disease with (acute) exacerbation: Secondary | ICD-10-CM | POA: Diagnosis not present

## 2021-03-18 DIAGNOSIS — G9340 Encephalopathy, unspecified: Secondary | ICD-10-CM | POA: Diagnosis not present

## 2021-03-20 DIAGNOSIS — N1832 Chronic kidney disease, stage 3b: Secondary | ICD-10-CM | POA: Diagnosis not present

## 2021-03-20 DIAGNOSIS — D509 Iron deficiency anemia, unspecified: Secondary | ICD-10-CM | POA: Diagnosis not present

## 2021-03-22 ENCOUNTER — Inpatient Hospital Stay (HOSPITAL_COMMUNITY)
Admission: EM | Admit: 2021-03-22 | Discharge: 2021-03-27 | DRG: 871 | Disposition: A | Discharge status: Skilled Nursing Facility | Payer: Medicare Other | Attending: Family Medicine | Admitting: Family Medicine

## 2021-03-22 ENCOUNTER — Other Ambulatory Visit: Payer: Self-pay

## 2021-03-22 ENCOUNTER — Encounter (HOSPITAL_COMMUNITY): Payer: Self-pay

## 2021-03-22 ENCOUNTER — Emergency Department (HOSPITAL_COMMUNITY): Payer: Medicare Other

## 2021-03-22 DIAGNOSIS — L89891 Pressure ulcer of other site, stage 1: Secondary | ICD-10-CM | POA: Diagnosis present

## 2021-03-22 DIAGNOSIS — L89152 Pressure ulcer of sacral region, stage 2: Secondary | ICD-10-CM | POA: Diagnosis not present

## 2021-03-22 DIAGNOSIS — E1165 Type 2 diabetes mellitus with hyperglycemia: Secondary | ICD-10-CM | POA: Diagnosis not present

## 2021-03-22 DIAGNOSIS — Z8673 Personal history of transient ischemic attack (TIA), and cerebral infarction without residual deficits: Secondary | ICD-10-CM

## 2021-03-22 DIAGNOSIS — F17210 Nicotine dependence, cigarettes, uncomplicated: Secondary | ICD-10-CM | POA: Diagnosis present

## 2021-03-22 DIAGNOSIS — E1122 Type 2 diabetes mellitus with diabetic chronic kidney disease: Secondary | ICD-10-CM | POA: Diagnosis not present

## 2021-03-22 DIAGNOSIS — R404 Transient alteration of awareness: Secondary | ICD-10-CM

## 2021-03-22 DIAGNOSIS — N390 Urinary tract infection, site not specified: Secondary | ICD-10-CM | POA: Diagnosis present

## 2021-03-22 DIAGNOSIS — R0902 Hypoxemia: Secondary | ICD-10-CM | POA: Diagnosis present

## 2021-03-22 DIAGNOSIS — Z794 Long term (current) use of insulin: Secondary | ICD-10-CM

## 2021-03-22 DIAGNOSIS — A419 Sepsis, unspecified organism: Principal | ICD-10-CM | POA: Diagnosis present

## 2021-03-22 DIAGNOSIS — G4730 Sleep apnea, unspecified: Secondary | ICD-10-CM | POA: Diagnosis not present

## 2021-03-22 DIAGNOSIS — N1831 Chronic kidney disease, stage 3a: Secondary | ICD-10-CM | POA: Diagnosis present

## 2021-03-22 DIAGNOSIS — I480 Paroxysmal atrial fibrillation: Secondary | ICD-10-CM | POA: Diagnosis present

## 2021-03-22 DIAGNOSIS — E11649 Type 2 diabetes mellitus with hypoglycemia without coma: Secondary | ICD-10-CM | POA: Diagnosis present

## 2021-03-22 DIAGNOSIS — I130 Hypertensive heart and chronic kidney disease with heart failure and stage 1 through stage 4 chronic kidney disease, or unspecified chronic kidney disease: Secondary | ICD-10-CM | POA: Diagnosis not present

## 2021-03-22 DIAGNOSIS — I509 Heart failure, unspecified: Secondary | ICD-10-CM | POA: Diagnosis not present

## 2021-03-22 DIAGNOSIS — Z20822 Contact with and (suspected) exposure to covid-19: Secondary | ICD-10-CM | POA: Diagnosis present

## 2021-03-22 DIAGNOSIS — I429 Cardiomyopathy, unspecified: Secondary | ICD-10-CM | POA: Diagnosis present

## 2021-03-22 DIAGNOSIS — I10 Essential (primary) hypertension: Secondary | ICD-10-CM | POA: Diagnosis present

## 2021-03-22 DIAGNOSIS — Z825 Family history of asthma and other chronic lower respiratory diseases: Secondary | ICD-10-CM

## 2021-03-22 DIAGNOSIS — R6520 Severe sepsis without septic shock: Secondary | ICD-10-CM | POA: Diagnosis not present

## 2021-03-22 DIAGNOSIS — N3000 Acute cystitis without hematuria: Secondary | ICD-10-CM | POA: Diagnosis not present

## 2021-03-22 DIAGNOSIS — Z833 Family history of diabetes mellitus: Secondary | ICD-10-CM

## 2021-03-22 DIAGNOSIS — L89893 Pressure ulcer of other site, stage 3: Secondary | ICD-10-CM | POA: Diagnosis not present

## 2021-03-22 DIAGNOSIS — G9341 Metabolic encephalopathy: Secondary | ICD-10-CM | POA: Diagnosis not present

## 2021-03-22 DIAGNOSIS — R000 Tachycardia, unspecified: Secondary | ICD-10-CM | POA: Diagnosis not present

## 2021-03-22 DIAGNOSIS — E162 Hypoglycemia, unspecified: Secondary | ICD-10-CM | POA: Diagnosis present

## 2021-03-22 DIAGNOSIS — E440 Moderate protein-calorie malnutrition: Secondary | ICD-10-CM | POA: Diagnosis not present

## 2021-03-22 DIAGNOSIS — I4891 Unspecified atrial fibrillation: Secondary | ICD-10-CM | POA: Diagnosis not present

## 2021-03-22 DIAGNOSIS — J449 Chronic obstructive pulmonary disease, unspecified: Secondary | ICD-10-CM | POA: Diagnosis present

## 2021-03-22 DIAGNOSIS — Z8249 Family history of ischemic heart disease and other diseases of the circulatory system: Secondary | ICD-10-CM | POA: Diagnosis not present

## 2021-03-22 DIAGNOSIS — L89312 Pressure ulcer of right buttock, stage 2: Secondary | ICD-10-CM | POA: Diagnosis present

## 2021-03-22 DIAGNOSIS — Z7984 Long term (current) use of oral hypoglycemic drugs: Secondary | ICD-10-CM

## 2021-03-22 DIAGNOSIS — Z743 Need for continuous supervision: Secondary | ICD-10-CM | POA: Diagnosis not present

## 2021-03-22 DIAGNOSIS — Z79899 Other long term (current) drug therapy: Secondary | ICD-10-CM

## 2021-03-22 DIAGNOSIS — E785 Hyperlipidemia, unspecified: Secondary | ICD-10-CM | POA: Diagnosis present

## 2021-03-22 DIAGNOSIS — Z7951 Long term (current) use of inhaled steroids: Secondary | ICD-10-CM

## 2021-03-22 DIAGNOSIS — N179 Acute kidney failure, unspecified: Secondary | ICD-10-CM | POA: Diagnosis not present

## 2021-03-22 DIAGNOSIS — Z7901 Long term (current) use of anticoagulants: Secondary | ICD-10-CM

## 2021-03-22 DIAGNOSIS — Z6837 Body mass index (BMI) 37.0-37.9, adult: Secondary | ICD-10-CM

## 2021-03-22 LAB — CBC WITH DIFFERENTIAL/PLATELET
Abs Immature Granulocytes: 0.07 10*3/uL (ref 0.00–0.07)
Basophils Absolute: 0.1 10*3/uL (ref 0.0–0.1)
Basophils Relative: 1 %
Eosinophils Absolute: 0.1 10*3/uL (ref 0.0–0.5)
Eosinophils Relative: 1 %
HCT: 40.9 % (ref 36.0–46.0)
Hemoglobin: 11.7 g/dL — ABNORMAL LOW (ref 12.0–15.0)
Immature Granulocytes: 1 %
Lymphocytes Relative: 12 %
Lymphs Abs: 1.4 10*3/uL (ref 0.7–4.0)
MCH: 28.9 pg (ref 26.0–34.0)
MCHC: 28.6 g/dL — ABNORMAL LOW (ref 30.0–36.0)
MCV: 101 fL — ABNORMAL HIGH (ref 80.0–100.0)
Monocytes Absolute: 0.9 10*3/uL (ref 0.1–1.0)
Monocytes Relative: 7 %
Neutro Abs: 9.7 10*3/uL — ABNORMAL HIGH (ref 1.7–7.7)
Neutrophils Relative %: 78 %
Platelets: 292 10*3/uL (ref 150–400)
RBC: 4.05 MIL/uL (ref 3.87–5.11)
RDW: 14.9 % (ref 11.5–15.5)
WBC: 12.3 10*3/uL — ABNORMAL HIGH (ref 4.0–10.5)
nRBC: 0 % (ref 0.0–0.2)

## 2021-03-22 LAB — URINALYSIS, COMPLETE (UACMP) WITH MICROSCOPIC
Bilirubin Urine: NEGATIVE
Glucose, UA: NEGATIVE mg/dL
Hgb urine dipstick: NEGATIVE
Ketones, ur: NEGATIVE mg/dL
Nitrite: NEGATIVE
Protein, ur: 100 mg/dL — AB
Specific Gravity, Urine: 1.009 (ref 1.005–1.030)
Squamous Epithelial / HPF: >50 — ABNORMAL HIGH (ref 0–5)
WBC, UA: >50 WBC/hpf — ABNORMAL HIGH (ref 0–5)
pH: 7 (ref 5.0–8.0)

## 2021-03-22 LAB — COMPREHENSIVE METABOLIC PANEL
ALT: 11 U/L (ref 0–44)
AST: 13 U/L — ABNORMAL LOW (ref 15–41)
Albumin: 2.3 g/dL — ABNORMAL LOW (ref 3.5–5.0)
Alkaline Phosphatase: 72 U/L (ref 38–126)
Anion gap: 6 (ref 5–15)
BUN: 35 mg/dL — ABNORMAL HIGH (ref 6–20)
CO2: 29 mmol/L (ref 22–32)
Calcium: 9.3 mg/dL (ref 8.9–10.3)
Chloride: 105 mmol/L (ref 98–111)
Creatinine, Ser: 2.06 mg/dL — ABNORMAL HIGH (ref 0.44–1.00)
GFR, Estimated: 28 mL/min — ABNORMAL LOW (ref >60)
Glucose, Bld: 59 mg/dL — ABNORMAL LOW (ref 70–99)
Potassium: 5.3 mmol/L — ABNORMAL HIGH (ref 3.5–5.1)
Sodium: 140 mmol/L (ref 135–145)
Total Bilirubin: 0.7 mg/dL (ref 0.3–1.2)
Total Protein: 7.5 g/dL (ref 6.5–8.1)

## 2021-03-22 LAB — CBG MONITORING, ED
Glucose-Capillary: 37 mg/dL — CL (ref 70–99)
Glucose-Capillary: 44 mg/dL — CL (ref 70–99)
Glucose-Capillary: 146 mg/dL — ABNORMAL HIGH (ref 70–99)

## 2021-03-22 LAB — LACTIC ACID, PLASMA: Lactic Acid, Venous: 0.7 mmol/L (ref 0.5–1.9)

## 2021-03-22 LAB — RESP PANEL BY RT-PCR (FLU A&B, COVID) ARPGX2
Influenza A by PCR: NEGATIVE
Influenza B by PCR: NEGATIVE
SARS Coronavirus 2 by RT PCR: NEGATIVE

## 2021-03-22 LAB — PROTIME-INR
INR: 2.3 — ABNORMAL HIGH (ref 0.8–1.2)
Prothrombin Time: 25.3 seconds — ABNORMAL HIGH (ref 11.4–15.2)

## 2021-03-22 LAB — APTT: aPTT: 48 seconds — ABNORMAL HIGH (ref 24–36)

## 2021-03-22 MED ORDER — DEXTROSE 5 % IV SOLN: Freq: Once | INTRAVENOUS | Status: DC

## 2021-03-22 MED ORDER — DEXTROSE 50 % IV SOLN: 1.0000 | Freq: Once | INTRAVENOUS | Status: DC

## 2021-03-22 MED ORDER — SODIUM CHLORIDE 0.9 % IV SOLN
2.0000 g | Freq: Once | INTRAVENOUS | Status: AC
  Administered 2021-03-22: 2 g via INTRAVENOUS
  Filled 2021-03-22: qty 20

## 2021-03-22 MED ORDER — LACTATED RINGERS IV SOLN: INTRAVENOUS | Status: DC

## 2021-03-22 MED ORDER — DEXTROSE-NACL 5-0.9 % IV SOLN: INTRAVENOUS | Status: DC

## 2021-03-22 MED ORDER — DEXTROSE 50 % IV SOLN
1.0000 | Freq: Once | INTRAVENOUS | Status: AC
  Administered 2021-03-22: 50 mL via INTRAVENOUS
  Filled 2021-03-22: qty 50

## 2021-03-22 NOTE — ED Notes (Signed)
POC blood glucose 44,repeat test 37. EDPs made aware, orders to be placed.

## 2021-03-22 NOTE — ED Provider Notes (Addendum)
Medical screening examination/treatment/procedure(s) were conducted as a shared visit with non-physician practitioner(s) and myself.  I personally evaluated the patient during the encounter.  Clinical Impression:   Final diagnoses:  Transient alteration of awareness  Acute cystitis without hematuria  Hypoglycemia    NH reports some transient hypoxia and change in MS - on eliquis - has history of encphalopathy / stroke / drug abuse - but appears comfortable and not confused at this time - she is in no distress - labs, CT head, ammonia, saline lock, EKG  This patient has no evidence of significant infected wound ulcers on exploration of her lower extremity wounds, there is no active bleeding, no foul smell, the wounds are well cared for.  There is no surrounding cellulitis.  She does have some confusion intermittently throughout the staying, she has a persistent tachycardia up to 110 and a leukocytosis.  Her renal function seems to be baseline, her glucose will be rechecked after being given replacement for her mild hypoglycemia.  Family member at the bedside states that this patient is not at her baseline, it is difficult for him to explain how that is but does not seem to be her normal self.  She does appear to be hemodynamically stable with regards to being afebrile, blood pressure of 106/65.  Again her pulse is mildly tachycardic, antibiotics given for possible urinary tract infection based on the appearance of the urine which is cloudy  This patient continues to have altered mental status intermittently and seemingly to get worse.  She was found to be hypoglycemic, she took oral glucose and did better, she then dropped further down to 44 and then to 37 and required IV dextrose x2 as well as an IV dextrose drip.  She does have a urinary tract infection based on urinalysis, antibiotics ordered, the patient is critically ill with altered mental status severe hypoglycemia recurrent as well as a  urinary tract infection meeting SIRS criteria, she will be admitted to the hospital.  Due to the patient having altered mental status, being a difficult stick and somewhat combative we decided to give antibiotics before formally getting all the cultures as she was very difficult to get and this would have made a delay in antibiotics.  .Critical Care Performed by: Noemi Chapel, MD Authorized by: Noemi Chapel, MD   Critical care provider statement:    Critical care time (minutes):  35   Critical care time was exclusive of:  Separately billable procedures and treating other patients and teaching time   Critical care was necessary to treat or prevent imminent or life-threatening deterioration of the following conditions:  Sepsis   Critical care was time spent personally by me on the following activities:  Blood draw for specimens, development of treatment plan with patient or surrogate, discussions with consultants, evaluation of patient's response to treatment, examination of patient, obtaining history from patient or surrogate, ordering and performing treatments and interventions, ordering and review of laboratory studies, ordering and review of radiographic studies, pulse oximetry, re-evaluation of patient's condition and review of old charts Comments:            Noemi Chapel, MD 03/22/21 2215    Noemi Chapel, MD 03/22/21 2219
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
## 2021-03-22 NOTE — ED Notes (Signed)
Son at bedside, updated son on care at this time, EDPs to see and update son. In and out cath performed and sent to lab. EKG performed and given to EDP. On cardiac monitoring, will continue to monitor.

## 2021-03-22 NOTE — ED Triage Notes (Signed)
Patient arrives from [facility], staff stated patient had AMS and low O2, patient A&O x4, O2 sat 100 on 3 liters, patient normal state of health is on 3 liters [name].

## 2021-03-22 NOTE — ED Notes (Signed)
Patient unable to sign for ED visit.

## 2021-03-22 NOTE — ED Notes (Signed)
Pt alert and oriented x 2-3, incorrect year and reports she is in ED "because I was sick". Provided water as requested, 02 sats 98-100% on 2LNC.

## 2021-03-22 NOTE — H&P (Signed)
TRH H&P    Patient Demographics:    [name], is a 55 y.o. female  MRN: [id number]  DOB - 18-Feb-1966  Admit Date - 03/22/2021  Referring MD/NP/PA: Sabra Heck  Outpatient Primary MD for the patient is Caprice Renshaw, MD  Patient coming from: [place]  Chief complaint- Altered mental status   HPI:    [name]  is a 55 y.o. female, with history of tobacco abuse, paroxysmal atrial fibrillation, hyperlipidemia, CHF, hypertension, diabetes mellitus type 2, COPD, CKD, and more presents the ED with a chief complaint of altered mental status.  Unfortunately patient is not able to provide much history as she reports that "they did not tell me why I came to the ED."  Nursing home had reported the patient was hypoxic.  When patient came into the ED she was maintaining her sats on 3 L nasal cannula which is reported to be her baseline oxygen requirement.  ED provider was able to speak with family at bedside who reported that this is not patient's baseline.  Her altered mental status seem to be getting worse in the ED.  Her initial glucose on chemistry was 59.  She was given oral glucose and her glucose then dropped to 44 and then to 37.  She was given an amp of D50 and started on dextrose drip.  She is also found to have a UTI.  Urine cultures and blood cultures are pending.  Patient was started on Rocephin.  Her last culture grew staph aureus that was sensitive to Cipro.  Rocephin will be changed to Cipro.  In the ED Patient was afebrile with a temp of 98.4, heart rate remains slightly tachycardic from 103-110, respiratory rate 14-22, blood pressure 106/65 satting 100% on 2 L nasal cannula Leukocytosis 12.3, hemoglobin 11.7 Creatinine at baseline 2.06 Respiratory panel negative UA indicative of UTI EKG without ischemic changes    Review of systems:    Unfortunately review of systems cannot be obtained secondary to  patient's altered mental status.   Past History of the following :    Past Medical History:  Diagnosis Date  . Alcohol use   . Ankle fracture, lateral malleolus, closed 2013  . Anxiety   . Aortic atherosclerosis ([place]) 03/10/2021  . Breast mass, left 2013  . CHF (congestive heart failure) ([place])    a. EF 20-25% by echo in 05/2016 with cath showing normal cors b. EF 50-55% in 07/2020 c. 01/2021: EF at 55-60% with moderate LVH  . Chronic anemia   . CKD (chronic kidney disease), stage III ([place])   . Cocaine abuse ([place])   . COPD (chronic obstructive pulmonary disease) ([place])   . Diabetes mellitus, type 2 ([place])   . Diabetic Charcot foot ([place])   . Essential hypertension   . History of cardiomyopathy   . History of GI bleed 2011  . Hyperlipidemia   . Noncompliance   . Obesity   . PAF (paroxysmal atrial fibrillation) ([place])   . Panic attacks   . PAT (paroxysmal atrial tachycardia) ([place])  Previously on Amiodarone  . Seizures ([place]) 10/01/2020  . Sleep apnea    Not on CPAP  . Stroke ([place]) 2018  . Tobacco abuse   . Urinary incontinence       Past Surgical History:  Procedure Laterality Date  . BIOPSY  11/12/2020   Procedure: BIOPSY;  Surgeon: Harvel Quale, MD;  [facility];  Service: Gastroenterology;;  . BREAST BIOPSY    . CARDIAC CATHETERIZATION N/A 07/28/2016   Procedure: Left Heart Cath and Coronary Angiography;  Surgeon: Jettie Booze, MD;  Location: [facility];  Service: Cardiovascular;  Laterality: N/A;  . COLONOSCOPY N/A 05/10/2019   Procedure: COLONOSCOPY;  Surgeon: Danie Binder, MD;  [facility];  Service: Endoscopy;  Laterality: N/A;  Phenergan 12.5 mg IV in pre-op  . DILATION AND CURETTAGE OF UTERUS    . ESOPHAGOGASTRODUODENOSCOPY (EGD) WITH PROPOFOL N/A 11/12/2020   Procedure: ESOPHAGOGASTRODUODENOSCOPY (EGD) WITH PROPOFOL;  Surgeon: Harvel Quale, MD;  [facility];  Service: Gastroenterology;   Laterality: N/A;  . I & D EXTREMITY Bilateral 09/22/2017   Procedure: BILATERAL DEBRIDEMENT LEG/FOOT ULCERS, APPLY VERAFLO WOUND VAC;  Surgeon: Newt Minion, MD;  Location: [facility];  Service: Orthopedics;  Laterality: Bilateral;  . I & D EXTREMITY Right 10/11/2018   Procedure: IRRIGATION AND DEBRIDEMENT RIGHT HAND;  Surgeon: [name], MD;  Location: [facility];  Service: Orthopedics;  Laterality: Right;  . I & D EXTREMITY Right 10/13/2018   Procedure: REPEAT IRRIGATION AND DEBRIDEMENT RIGHT HAND;  Surgeon: [name], MD;  Location: [facility];  Service: Orthopedics;  Laterality: Right;  . I & D EXTREMITY Right 11/22/2018   Procedure: IRRIGATION AND DEBRIDEMENT AND PINNING RIGHT HAND;  Surgeon: [name], MD;  Location: [facility];  Service: Orthopedics;  Laterality: Right;  . IR RADIOLOGIST EVAL & MGMT  07/05/2018  . POLYPECTOMY  05/10/2019   Procedure: POLYPECTOMY;  Surgeon: Danie Binder, MD;  [facility];  Service: Endoscopy;;  . SKIN SPLIT GRAFT Bilateral 09/28/2017   Procedure: BILATERAL SPLIT THICKNESS SKIN GRAFT LEGS/FEET AND APPLY VAC;  Surgeon: Newt Minion, MD;  Location: [facility];  Service: Orthopedics;  Laterality: Bilateral;  . SKIN SPLIT GRAFT Right 11/22/2018   Procedure: SKIN GRAFT SPLIT THICKNESS;  Surgeon: [name], MD;  Location: [facility];  Service: Orthopedics;  Laterality: Right;      Social History:      Social History   Tobacco Use  . Smoking status: Current Some Day Smoker    Packs/day: 0.25    Years: 20.00    Pack years: 5.00    Types: Cigarettes    Last attempt to quit: 06/03/2017    Years since quitting: 3.8  . Smokeless tobacco: Never Used  Substance Use Topics  . Alcohol use: Not Currently    Alcohol/week: 0.0 standard drinks    Comment: Quit in 2017       Family History :     Family History  Problem Relation Age of Onset  . Hypertension Mother   . Heart attack Mother   . Hypertension Father        CABG   . Hypertension  Sister   . Hypertension Brother   . Hypertension Sister   . Cancer Sister        breast   . Arthritis Other   . Cancer Other   . Diabetes Other   . Asthma Other   . Hypertension Daughter   .  Hypertension Son       Home Medications:   Prior to Admission medications   Medication Sig Start Date End Date Taking? Authorizing Provider  acetaminophen (TYLENOL) 325 MG tablet Take 2 tablets (650 mg total) by mouth every 6 (six) hours as needed for headache, fever or mild pain. 11/04/20   Johnson, Clanford L, MD  albuterol (PROAIR HFA) 108 (90 Base) MCG/ACT inhaler INHALE 2 PUFFS EVERY 6 HOURS AS NEEDED FOR SHORTNESS OF BREATH/WHEEZING. Patient taking differently: Inhale 2 puffs into the lungs every 6 (six) hours as needed for wheezing or shortness of breath. 08/05/20   [name], MD  amLODipine (NORVASC) 10 MG tablet Take 1 tablet (10 mg total) by mouth daily. 12/07/20   Roxan Hockey, MD  apixaban (ELIQUIS) 5 MG TABS tablet Take 1 tablet (5 mg total) by mouth 2 (two) times daily. Okay to restart Eliquis on 11/17/2020 12/07/20   Roxan Hockey, MD  cephALEXin (KEFLEX) 500 MG capsule Take 1 capsule (500 mg total) by mouth 2 (two) times daily. 03/14/21   Evalee Jefferson, PA-C  Dextromethorphan-guaiFENesin (GUAIFENESIN-DM) 10-100 MG/5ML LIQD Take 10 mLs by mouth every 6 (six) hours as needed (cough).    [provider]  DULoxetine (CYMBALTA) 60 MG capsule Take 1 capsule (60 mg total) by mouth daily. 12/07/20   Roxan Hockey, MD  furosemide (LASIX) 40 MG tablet Take 0.5 tablets (20 mg total) by mouth every Tuesday, Thursday, Saturday, and Sunday. 03/13/21   Roxan Hockey, MD  gabapentin (NEURONTIN) 300 MG capsule TAKE 1 CAPSULE BY MOUTH THREE TIMES A DAY. Patient taking differently: Take 300 mg by mouth 3 (three) times daily. 11/14/19   Newt Minion, MD  insulin glargine (LANTUS) 100 UNIT/ML injection Inject 0.3 mLs (30 Units total) into the skin daily. Start on 03/14/21 03/14/21    Roxan Hockey, MD  insulin lispro (HUMALOG) 100 UNIT/ML injection Inject into the skin 3 (three) times daily before meals. 151-200= 1 unit, 201-250= 2 units, 251-300= 4 units, 301-350= 6 units, 351-400=8 units, 401-500= 10  units    [provider]  levETIRAcetam (KEPPRA) 750 MG tablet Take 1 tablet (750 mg total) by mouth 2 (two) times daily. 01/30/21   Suzzanne Cloud, NP  linagliptin (TRADJENTA) 5 MG TABS tablet Take 5 mg by mouth daily.    [provider]  LORazepam (ATIVAN) 0.5 MG tablet Take 1 tablet (0.5 mg total) by mouth every 8 (eight) hours as needed for anxiety. 03/11/21   Johnson, Clanford L, MD  metoprolol tartrate (LOPRESSOR) 25 MG tablet Take 1 tablet (25 mg total) by mouth 2 (two) times daily. 03/11/21   Johnson, Clanford L, MD  montelukast (SINGULAIR) 10 MG tablet TAKE (1) TABLET BY MOUTH AT BEDTIME. Patient taking differently: Take 10 mg by mouth at bedtime. 07/19/20   [name], MD  nystatin (MYCOSTATIN) 100000 UNIT/ML suspension Take 5 mLs by mouth 4 (four) times daily. 03/05/21   [provider]  omeprazole (PRILOSEC) 20 MG capsule Take 20 mg by mouth daily.    [provider]  oxybutynin (DITROPAN-XL) 5 MG 24 hr tablet Take 5 mg by mouth at bedtime.    [provider]  oxyCODONE (OXY IR/ROXICODONE) 5 MG immediate release tablet Take 1 tablet (5 mg total) by mouth 3 (three) times daily as needed for severe pain. 03/13/21   Roxan Hockey, MD  potassium chloride SA (KLOR-CON) 20 MEQ tablet Take 20 mEq by mouth daily.    [provider]  rosuvastatin (CRESTOR) 10 MG tablet Take 1 tablet (10 mg total) by mouth daily. 12/07/20   Roxan Hockey, MD  tiotropium (SPIRIVA HANDIHALER) 18 MCG inhalation capsule Place 18 mcg into inhaler and inhale daily as needed (Shortness of breath).    [provider]  traZODone (DESYREL) 50 MG tablet Take 1 tablet (50 mg total) by mouth at bedtime. 12/07/20   Roxan Hockey, MD   TRULICITY 3 EX/5.2WU SOPN Inject 3 mg into the skin See admin instructions. Inject 0.5 mls subcutaneously one time a day every Friday 03/11/21   [provider]  vitamin C (ASCORBIC ACID) 500 MG tablet Take 500 mg by mouth daily.    [provider]  VITAMIN D, CHOLECALCIFEROL, PO Take 5,000 Units by mouth daily.    [provider]  Zinc Oxide 10 % AERO Apply 1 application topically daily.    [provider]  zinc sulfate 220 (50 Zn) MG capsule Take 1 capsule (220 mg total) by mouth daily. 12/08/20   Roxan Hockey, MD     Allergies:    No Known Allergies   Physical Exam:   Vitals  Blood pressure 106/65, pulse (!) 110, temperature 98.4 F (36.9 C), temperature source Oral, resp. rate 17, height 5\' 1"  (1.549 m), weight 91 kg, last menstrual period 05/19/2016, SpO2 100 %.  1.  General: Patient lying supine in bed in reverse Trendelenburg no acute distress  2. Psychiatric: Irritable, cooperative with exam, oriented to self and place but not to time or context  3. Neurologic: Speech and language are normal, face is symmetric, equal sensation in all 4 extremities, change from baseline mentation with confusion  4. HEENMT:  Head is atraumatic, normocephalic, pupils reactive to light, neck is supple, trachea is midline, mucous membranes are moist  5. Respiratory : Lungs are clear to auscultation bilaterally without wheezing, rhonchi, rales, no cyanosis  6. Cardiovascular : Heart rate is slightly tachycardic, rhythm is regular, slight systolic murmur, no rubs or gallops, no peripheral edema, venous stasis changes of the lower extremities  7. Gastrointestinal:  Abdomen is soft, nondistended, nontender to palpation bowel sounds active  8. Skin:  Venous stasis changes with healing ulcers on the lower extremities bilaterally, no rashes or other acute lesions  9.Musculoskeletal:  Lower extremities diffusely tender to palpation, no acute deformities,  no peripheral edema    Data Review:    CBC Recent Labs  Lab 03/22/21 1718  WBC 12.3*  HGB 11.7*  HCT 40.9  PLT 292  MCV 101.0*  MCH 28.9  MCHC 28.6*  RDW 14.9  LYMPHSABS 1.4  MONOABS 0.9  EOSABS 0.1  BASOSABS 0.1   ------------------------------------------------------------------------------------------------------------------  Results for orders placed or performed during the hospital encounter of 03/22/21 (from the past 48 hour(s))  Comprehensive metabolic panel     Status: Abnormal   Collection Time: 03/22/21  5:18 PM  Result Value Ref Range   Sodium 140 135 - 145 mmol/L   Potassium 5.3 (H) 3.5 - 5.1 mmol/L   Chloride 105 98 - 111 mmol/L   CO2 29 22 - 32 mmol/L   Glucose, Bld 59 (L) 70 - 99 mg/dL    Comment: Glucose reference range applies only to samples taken after fasting for at least 8 hours.   BUN 35 (H) 6 - 20 mg/dL   Creatinine, Ser 2.06 (H) 0.44 - 1.00 mg/dL   Calcium 9.3 8.9 - 10.3 mg/dL   Total Protein 7.5 6.5 - 8.1 g/dL   Albumin 2.3 (L)  3.5 - 5.0 g/dL   AST 13 (L) 15 - 41 U/L   ALT 11 0 - 44 U/L   Alkaline Phosphatase 72 38 - 126 U/L   Total Bilirubin 0.7 0.3 - 1.2 mg/dL   GFR, Estimated 28 (L) >60 mL/min    Comment: (NOTE) Calculated using the CKD-EPI Creatinine Equation (2021)    Anion gap 6 5 - 15    Comment: Performed at [facility], [address] [place], [place] [address]  CBC WITH DIFFERENTIAL     Status: Abnormal   Collection Time: 03/22/21  5:18 PM  Result Value Ref Range   WBC 12.3 (H) 4.0 - 10.5 K/uL   RBC 4.05 3.87 - 5.11 MIL/uL   Hemoglobin 11.7 (L) 12.0 - 15.0 g/dL   HCT 40.9 36.0 - 46.0 %   MCV 101.0 (H) 80.0 - 100.0 fL   MCH 28.9 26.0 - 34.0 pg   MCHC 28.6 (L) 30.0 - 36.0 g/dL   RDW 14.9 11.5 - 15.5 %   Platelets 292 150 - 400 K/uL   nRBC 0.0 0.0 - 0.2 %   Neutrophils Relative % 78 %   Neutro Abs 9.7 (H) 1.7 - 7.7 K/uL   Lymphocytes Relative 12 %   Lymphs Abs 1.4 0.7 - 4.0 K/uL   Monocytes Relative 7 %   Monocytes  Absolute 0.9 0.1 - 1.0 K/uL   Eosinophils Relative 1 %   Eosinophils Absolute 0.1 0.0 - 0.5 K/uL   Basophils Relative 1 %   Basophils Absolute 0.1 0.0 - 0.1 K/uL   Immature Granulocytes 1 %   Abs Immature Granulocytes 0.07 0.00 - 0.07 K/uL    Comment: Performed at [facility], [address] [place], [place] [address]  Urinalysis, Complete w Microscopic     Status: Abnormal   Collection Time: 03/22/21  8:55 PM  Result Value Ref Range   Color, Urine YELLOW YELLOW   APPearance CLOUDY (A) CLEAR   Specific Gravity, Urine 1.009 1.005 - 1.030   pH 7.0 5.0 - 8.0   Glucose, UA NEGATIVE NEGATIVE mg/dL   Hgb urine dipstick NEGATIVE NEGATIVE   Bilirubin Urine NEGATIVE NEGATIVE   Ketones, ur NEGATIVE NEGATIVE mg/dL   Protein, ur 100 (A) NEGATIVE mg/dL   Nitrite NEGATIVE NEGATIVE   Leukocytes,Ua LARGE (A) NEGATIVE   RBC / HPF 6-10 0 - 5 RBC/hpf   WBC, UA >50 (H) 0 - 5 WBC/hpf   Bacteria, UA RARE (A) NONE SEEN   Squamous Epithelial / LPF >50 (H) 0 - 5   WBC Clumps PRESENT    Budding Yeast PRESENT    Ca Oxalate Crys, UA PRESENT     Comment: Performed at [facility], [address], [place], [place] [address]  CBG monitoring, ED     Status: Abnormal   Collection Time: 03/22/21 10:08 PM  Result Value Ref Range   Glucose-Capillary 44 (LL) 70 - 99 mg/dL    Comment: Glucose reference range applies only to samples taken after fasting for at least 8 hours.   Comment 1 Document in Chart   Resp Panel by RT-PCR (Flu A&B, Covid) Nasopharyngeal Swab     Status: None   Collection Time: 03/22/21 10:08 PM   Specimen: Nasopharyngeal Swab; Nasopharyngeal(NP) swabs in vial transport medium  Result Value Ref Range   SARS Coronavirus 2 by RT PCR NEGATIVE NEGATIVE    Comment: (NOTE) SARS-CoV-2 target nucleic acids are NOT DETECTED.  The SARS-CoV-2 RNA is generally detectable in upper respiratory  specimens during the acute phase of infection. The lowest concentration of SARS-CoV-2 viral copies this assay  can detect is 138 copies/mL. A negative result does not preclude SARS-Cov-2 infection and should not be used as the sole basis for treatment or other patient management decisions. A negative result may occur with  improper specimen collection/handling, submission of specimen other than nasopharyngeal swab, presence of viral mutation(s) within the areas targeted by this assay, and inadequate number of viral copies(<138 copies/mL). A negative result must be combined with clinical observations, patient history, and epidemiological information. The expected result is Negative.  Fact Sheet for Patients:  EntrepreneurPulse.com.au  Fact Sheet for Healthcare Providers:  IncredibleEmployment.be  This test is no t yet approved or cleared by the [place] FDA and  has been authorized for detection and/or diagnosis of SARS-CoV-2 by FDA under an Emergency Use Authorization (EUA). This EUA will remain  in effect (meaning this test can be used) for the duration of the COVID-19 declaration under Section 564(b)(1) of the Act, 21 U.S.C.section 360bbb-3(b)(1), unless the authorization is terminated  or revoked sooner.       Influenza A by PCR NEGATIVE NEGATIVE   Influenza B by PCR NEGATIVE NEGATIVE    Comment: (NOTE) The Xpert Xpress SARS-CoV-2/FLU/RSV plus assay is intended as an aid in the diagnosis of influenza from Nasopharyngeal swab specimens and should not be used as a sole basis for treatment. Nasal washings and aspirates are unacceptable for Xpert Xpress SARS-CoV-2/FLU/RSV testing.  Fact Sheet for Patients: EntrepreneurPulse.com.au  Fact Sheet for Healthcare Providers: IncredibleEmployment.be  This test is not yet approved or cleared by the [place] FDA and has been authorized for detection and/or diagnosis of SARS-CoV-2 by FDA under an Emergency Use Authorization (EUA). This EUA will remain in effect  (meaning this test can be used) for the duration of the COVID-19 declaration under Section 564(b)(1) of the Act, 21 U.S.C. section 360bbb-3(b)(1), unless the authorization is terminated or revoked.  Performed at [facility], [address] [place], [place] [address]   POC CBG, ED     Status: Abnormal   Collection Time: 03/22/21 10:12 PM  Result Value Ref Range   Glucose-Capillary 37 (LL) 70 - 99 mg/dL    Comment: Glucose reference range applies only to samples taken after fasting for at least 8 hours.   Comment 1 Document in Chart   Lactic acid, plasma     Status: None   Collection Time: 03/22/21 10:39 PM  Result Value Ref Range   Lactic Acid, Venous 0.7 0.5 - 1.9 mmol/L    Comment: Performed at [facility], [address], [place], [place] [address]  Protime-INR     Status: Abnormal   Collection Time: 03/22/21 10:39 PM  Result Value Ref Range   Prothrombin Time 25.3 (H) 11.4 - 15.2 seconds   INR 2.3 (H) 0.8 - 1.2    Comment: (NOTE) INR goal varies based on device and disease states. Performed at [facility], [address], [place], [place] [address]   APTT     Status: Abnormal   Collection Time: 03/22/21 10:39 PM  Result Value Ref Range   aPTT 48 (H) 24 - 36 seconds    Comment:        IF BASELINE aPTT IS ELEVATED, SUGGEST PATIENT RISK ASSESSMENT BE USED TO DETERMINE APPROPRIATE ANTICOAGULANT THERAPY. Performed at [facility], [address] [place], [place] [address]   CBG monitoring, ED     Status: Abnormal   Collection Time:  03/22/21 11:10 PM  Result Value Ref Range   Glucose-Capillary 146 (H) 70 - 99 mg/dL    Comment: Glucose reference range applies only to samples taken after fasting for at least 8 hours.   *Note: Due to a large number of results and/or encounters for the requested time period, some results have not been displayed. A complete set of results can be found in Results Review.    Chemistries  Recent Labs  Lab 03/22/21 1718  NA 140  K 5.3*  CL  105  CO2 29  GLUCOSE 59*  BUN 35*  CREATININE 2.06*  CALCIUM 9.3  AST 13*  ALT 11  ALKPHOS 72  BILITOT 0.7   ------------------------------------------------------------------------------------------------------------------  ------------------------------------------------------------------------------------------------------------------ GFR: Estimated Creatinine Clearance: 31.3 mL/min (A) (by C-G formula based on SCr of 2.06 mg/dL (H)). Liver Function Tests: Recent Labs  Lab 03/22/21 1718  AST 13*  ALT 11  ALKPHOS 72  BILITOT 0.7  PROT 7.5  ALBUMIN 2.3*   No results for input(s): LIPASE, AMYLASE in the last 168 hours. No results for input(s): AMMONIA in the last 168 hours. Coagulation Profile: Recent Labs  Lab 03/22/21 2239  INR 2.3*   Cardiac Enzymes: No results for input(s): CKTOTAL, CKMB, CKMBINDEX, TROPONINI in the last 168 hours. BNP (last 3 results) No results for input(s): PROBNP in the last 8760 hours. HbA1C: No results for input(s): HGBA1C in the last 72 hours. CBG: Recent Labs  Lab 03/22/21 2208 03/22/21 2212 03/22/21 2310  GLUCAP 44* 37* 146*   Lipid Profile: No results for input(s): CHOL, HDL, LDLCALC, TRIG, CHOLHDL, LDLDIRECT in the last 72 hours. Thyroid Function Tests: No results for input(s): TSH, T4TOTAL, FREET4, T3FREE, THYROIDAB in the last 72 hours. Anemia Panel: No results for input(s): VITAMINB12, FOLATE, FERRITIN, TIBC, IRON, RETICCTPCT in the last 72 hours.  --------------------------------------------------------------------------------------------------------------- Urine analysis:    Component Value Date/Time   COLORURINE YELLOW 03/22/2021 2055   APPEARANCEUR CLOUDY (A) 03/22/2021 2055   LABSPEC 1.009 03/22/2021 2055   PHURINE 7.0 03/22/2021 2055   GLUCOSEU NEGATIVE 03/22/2021 2055   HGBUR NEGATIVE 03/22/2021 2055   [place] 03/22/2021 2055   [place] 03/22/2021 2055   PROTEINUR 100 (A) 03/22/2021  2055   UROBILINOGEN 0.2 09/11/2014 1319   NITRITE NEGATIVE 03/22/2021 2055   LEUKOCYTESUR LARGE (A) 03/22/2021 2055      Imaging Results:    CT HEAD WO CONTRAST  Result Date: 03/22/2021 CLINICAL DATA:  Altered mental status EXAM: CT HEAD WITHOUT CONTRAST TECHNIQUE: Contiguous axial images were obtained from the base of the skull through the vertex without intravenous contrast. COMPARISON:  Mar 11, 2021, August 15, 2020 FINDINGS: Brain: No evidence of acute infarction, hemorrhage, hydrocephalus, extra-axial collection or mass lesion/mass effect. Periventricular white matter hypodensities consistent with sequela of chronic microvascular ischemic disease. Remote lacunar infarction of the LEFT thalamus and caudate. Vascular: Vascular calcifications of the carotid siphons. Skull: No acute fracture.  Remote LEFT medial orbital wall fracture. Sinuses/Orbits: No acute finding. Other: None. IMPRESSION: No acute intracranial abnormality. Electronically Signed   By: Valentino Saxon MD   On: 03/22/2021 16:20    My personal review of EKG: Rhythm NSR, Rate 99 /min, QTc 419 ,no Acute ST changes   Assessment & Plan:    Active Problems:   HTN (hypertension), malignant   Acute lower UTI   Atrial fibrillation, controlled ([place])   Acute metabolic encephalopathy   Hypoglycemia associated with diabetes ([place])   1. Acute Metabolic encephalopathy 1. Multifactorial - UTI and  hypoglycemic 2. CT head without acute abnormality 3. Rocephin started 4. Blood cultures pending 5. Urine culture pending 6. Lactic acid normal 7. Continue to monitor 2. Hypoglycemia - in the setting of DMII 1. Hold home meds 2. 1 amp given in ED 3. Continue d5 4. Monitor glucose q2H  3. UTI with sepsis 1. UA indicative of UTI 2. Urine culture pending 3. Blood culture pending 4. HR 103 - 110, R 22, Leukocytosis 12.3 with altered mental status 5. Continue Rocephin 4. Mod pro cal mal 1. Continue Ensure 2. Albumin  2.3 3. Continue Ensure 5. Paroxysmal atrial fib 1. Continue Eliquis and lopressor 6. HTN 1. Continue norvasc, and lopressor 7. HLD 1. Continue statin 8. DMII 1. Holding home meds as above    DVT Prophylaxis-   eliquis - SCDs   AM Labs Ordered, also please review Full Orders  Family Communication: No family at bedside Code Status:  Full  Admission status: Observation  Time spent in minutes :[place] DO

## 2021-03-23 DIAGNOSIS — L89893 Pressure ulcer of other site, stage 3: Secondary | ICD-10-CM | POA: Diagnosis present

## 2021-03-23 DIAGNOSIS — I509 Heart failure, unspecified: Secondary | ICD-10-CM | POA: Diagnosis present

## 2021-03-23 DIAGNOSIS — N390 Urinary tract infection, site not specified: Secondary | ICD-10-CM

## 2021-03-23 DIAGNOSIS — Z20822 Contact with and (suspected) exposure to covid-19: Secondary | ICD-10-CM | POA: Diagnosis present

## 2021-03-23 DIAGNOSIS — L89312 Pressure ulcer of right buttock, stage 2: Secondary | ICD-10-CM | POA: Diagnosis present

## 2021-03-23 DIAGNOSIS — A419 Sepsis, unspecified organism: Secondary | ICD-10-CM | POA: Diagnosis not present

## 2021-03-23 DIAGNOSIS — L89891 Pressure ulcer of other site, stage 1: Secondary | ICD-10-CM | POA: Diagnosis present

## 2021-03-23 DIAGNOSIS — E1165 Type 2 diabetes mellitus with hyperglycemia: Secondary | ICD-10-CM | POA: Diagnosis present

## 2021-03-23 DIAGNOSIS — I429 Cardiomyopathy, unspecified: Secondary | ICD-10-CM | POA: Diagnosis present

## 2021-03-23 DIAGNOSIS — F17210 Nicotine dependence, cigarettes, uncomplicated: Secondary | ICD-10-CM | POA: Diagnosis present

## 2021-03-23 DIAGNOSIS — R404 Transient alteration of awareness: Secondary | ICD-10-CM | POA: Diagnosis present

## 2021-03-23 DIAGNOSIS — G4730 Sleep apnea, unspecified: Secondary | ICD-10-CM | POA: Diagnosis present

## 2021-03-23 DIAGNOSIS — I480 Paroxysmal atrial fibrillation: Secondary | ICD-10-CM | POA: Diagnosis present

## 2021-03-23 DIAGNOSIS — Z6837 Body mass index (BMI) 37.0-37.9, adult: Secondary | ICD-10-CM | POA: Diagnosis not present

## 2021-03-23 DIAGNOSIS — N3000 Acute cystitis without hematuria: Secondary | ICD-10-CM | POA: Diagnosis present

## 2021-03-23 DIAGNOSIS — E11649 Type 2 diabetes mellitus with hypoglycemia without coma: Secondary | ICD-10-CM | POA: Diagnosis not present

## 2021-03-23 DIAGNOSIS — I10 Essential (primary) hypertension: Secondary | ICD-10-CM | POA: Diagnosis not present

## 2021-03-23 DIAGNOSIS — G9341 Metabolic encephalopathy: Secondary | ICD-10-CM | POA: Diagnosis not present

## 2021-03-23 DIAGNOSIS — Z8673 Personal history of transient ischemic attack (TIA), and cerebral infarction without residual deficits: Secondary | ICD-10-CM | POA: Diagnosis not present

## 2021-03-23 DIAGNOSIS — E785 Hyperlipidemia, unspecified: Secondary | ICD-10-CM | POA: Diagnosis present

## 2021-03-23 DIAGNOSIS — E440 Moderate protein-calorie malnutrition: Secondary | ICD-10-CM | POA: Diagnosis present

## 2021-03-23 DIAGNOSIS — I130 Hypertensive heart and chronic kidney disease with heart failure and stage 1 through stage 4 chronic kidney disease, or unspecified chronic kidney disease: Secondary | ICD-10-CM | POA: Diagnosis present

## 2021-03-23 DIAGNOSIS — E1122 Type 2 diabetes mellitus with diabetic chronic kidney disease: Secondary | ICD-10-CM | POA: Diagnosis present

## 2021-03-23 DIAGNOSIS — I4891 Unspecified atrial fibrillation: Secondary | ICD-10-CM | POA: Diagnosis not present

## 2021-03-23 DIAGNOSIS — Z8249 Family history of ischemic heart disease and other diseases of the circulatory system: Secondary | ICD-10-CM | POA: Diagnosis not present

## 2021-03-23 DIAGNOSIS — N1831 Chronic kidney disease, stage 3a: Secondary | ICD-10-CM | POA: Diagnosis present

## 2021-03-23 DIAGNOSIS — J449 Chronic obstructive pulmonary disease, unspecified: Secondary | ICD-10-CM | POA: Diagnosis present

## 2021-03-23 DIAGNOSIS — L89152 Pressure ulcer of sacral region, stage 2: Secondary | ICD-10-CM | POA: Diagnosis present

## 2021-03-23 DIAGNOSIS — R6520 Severe sepsis without septic shock: Secondary | ICD-10-CM | POA: Diagnosis not present

## 2021-03-23 DIAGNOSIS — N179 Acute kidney failure, unspecified: Secondary | ICD-10-CM | POA: Diagnosis not present

## 2021-03-23 LAB — CBC WITH DIFFERENTIAL/PLATELET
Abs Immature Granulocytes: 0.07 10*3/uL (ref 0.00–0.07)
Basophils Absolute: 0.1 10*3/uL (ref 0.0–0.1)
Basophils Relative: 0 %
Eosinophils Absolute: 0.1 10*3/uL (ref 0.0–0.5)
Eosinophils Relative: 1 %
HCT: 30.7 % — ABNORMAL LOW (ref 36.0–46.0)
Hemoglobin: 9 g/dL — ABNORMAL LOW (ref 12.0–15.0)
Immature Granulocytes: 1 %
Lymphocytes Relative: 9 %
Lymphs Abs: 1 10*3/uL (ref 0.7–4.0)
MCH: 28.6 pg (ref 26.0–34.0)
MCHC: 29.3 g/dL — ABNORMAL LOW (ref 30.0–36.0)
MCV: 97.5 fL (ref 80.0–100.0)
Monocytes Absolute: 1 10*3/uL (ref 0.1–1.0)
Monocytes Relative: 8 %
Neutro Abs: 9.6 10*3/uL — ABNORMAL HIGH (ref 1.7–7.7)
Neutrophils Relative %: 81 %
Platelets: 278 10*3/uL (ref 150–400)
RBC: 3.15 MIL/uL — ABNORMAL LOW (ref 3.87–5.11)
RDW: 15.2 % (ref 11.5–15.5)
WBC: 11.8 10*3/uL — ABNORMAL HIGH (ref 4.0–10.5)
nRBC: 0 % (ref 0.0–0.2)

## 2021-03-23 LAB — LACTIC ACID, PLASMA: Lactic Acid, Venous: 0.8 mmol/L (ref 0.5–1.9)

## 2021-03-23 LAB — GLUCOSE, CAPILLARY
Glucose-Capillary: 148 mg/dL — ABNORMAL HIGH (ref 70–99)
Glucose-Capillary: 203 mg/dL — ABNORMAL HIGH (ref 70–99)
Glucose-Capillary: 220 mg/dL — ABNORMAL HIGH (ref 70–99)

## 2021-03-23 LAB — COMPREHENSIVE METABOLIC PANEL
ALT: 9 U/L (ref 0–44)
AST: 11 U/L — ABNORMAL LOW (ref 15–41)
Albumin: 1.6 g/dL — ABNORMAL LOW (ref 3.5–5.0)
Alkaline Phosphatase: 55 U/L (ref 38–126)
Anion gap: 8 (ref 5–15)
BUN: 34 mg/dL — ABNORMAL HIGH (ref 6–20)
CO2: 27 mmol/L (ref 22–32)
Calcium: 8.4 mg/dL — ABNORMAL LOW (ref 8.9–10.3)
Chloride: 107 mmol/L (ref 98–111)
Creatinine, Ser: 2.13 mg/dL — ABNORMAL HIGH (ref 0.44–1.00)
GFR, Estimated: 27 mL/min — ABNORMAL LOW (ref >60)
Glucose, Bld: 167 mg/dL — ABNORMAL HIGH (ref 70–99)
Potassium: 4.7 mmol/L (ref 3.5–5.1)
Sodium: 142 mmol/L (ref 135–145)
Total Bilirubin: 0.4 mg/dL (ref 0.3–1.2)
Total Protein: 5.9 g/dL — ABNORMAL LOW (ref 6.5–8.1)

## 2021-03-23 LAB — CBG MONITORING, ED
Glucose-Capillary: 120 mg/dL — ABNORMAL HIGH (ref 70–99)
Glucose-Capillary: 128 mg/dL — ABNORMAL HIGH (ref 70–99)
Glucose-Capillary: 132 mg/dL — ABNORMAL HIGH (ref 70–99)

## 2021-03-23 LAB — MAGNESIUM: Magnesium: 1.6 mg/dL — ABNORMAL LOW (ref 1.7–2.4)

## 2021-03-23 LAB — TSH: TSH: 0.991 u[IU]/mL (ref 0.350–4.500)

## 2021-03-23 LAB — MRSA PCR SCREENING: MRSA by PCR: NEGATIVE

## 2021-03-23 LAB — AMMONIA: Ammonia: 25 umol/L (ref 9–35)

## 2021-03-23 MED ORDER — ENSURE ENLIVE PO LIQD
237.0000 mL | Freq: Two times a day (BID) | ORAL | Status: DC
  Administered 2021-03-23 – 2021-03-27 (×6): 237 mL via ORAL
  Filled 2021-03-23 (×4): qty 237

## 2021-03-23 MED ORDER — TRAZODONE HCL 50 MG PO TABS
50.0000 mg | ORAL_TABLET | Freq: Every day | ORAL | Status: DC
  Administered 2021-03-23 – 2021-03-25 (×3): 50 mg via ORAL
  Filled 2021-03-23 (×4): qty 1

## 2021-03-23 MED ORDER — OXYCODONE HCL 5 MG PO TABS
5.0000 mg | ORAL_TABLET | Freq: Three times a day (TID) | ORAL | Status: DC | PRN
  Administered 2021-03-27: 5 mg via ORAL
  Filled 2021-03-23 (×4): qty 1

## 2021-03-23 MED ORDER — LEVETIRACETAM 500 MG PO TABS
750.0000 mg | ORAL_TABLET | Freq: Two times a day (BID) | ORAL | Status: DC
  Administered 2021-03-23 – 2021-03-27 (×9): 750 mg via ORAL
  Filled 2021-03-23 (×10): qty 1

## 2021-03-23 MED ORDER — METOPROLOL TARTRATE 25 MG PO TABS
25.0000 mg | ORAL_TABLET | Freq: Two times a day (BID) | ORAL | Status: DC
  Administered 2021-03-23 – 2021-03-27 (×8): 25 mg via ORAL
  Filled 2021-03-23 (×11): qty 1

## 2021-03-23 MED ORDER — ONDANSETRON HCL 4 MG PO TABS
4.0000 mg | ORAL_TABLET | Freq: Four times a day (QID) | ORAL | Status: DC | PRN
Start: 2021-03-22 — End: 2021-03-28

## 2021-03-23 MED ORDER — PANTOPRAZOLE SODIUM 40 MG PO TBEC
40.0000 mg | DELAYED_RELEASE_TABLET | Freq: Every day | ORAL | Status: DC
  Administered 2021-03-23 – 2021-03-27 (×5): 40 mg via ORAL
  Filled 2021-03-23 (×5): qty 1

## 2021-03-23 MED ORDER — ROSUVASTATIN CALCIUM 10 MG PO TABS
10.0000 mg | ORAL_TABLET | Freq: Every day | ORAL | Status: DC
  Administered 2021-03-23 – 2021-03-27 (×5): 10 mg via ORAL
  Filled 2021-03-23 (×5): qty 1

## 2021-03-23 MED ORDER — OXYCODONE HCL 5 MG PO TABS
5.0000 mg | ORAL_TABLET | ORAL | Status: DC | PRN
  Administered 2021-03-23 – 2021-03-26 (×7): 5 mg via ORAL
  Filled 2021-03-23 (×5): qty 1

## 2021-03-23 MED ORDER — OXYBUTYNIN CHLORIDE ER 5 MG PO TB24
5.0000 mg | ORAL_TABLET | Freq: Every day | ORAL | Status: DC
  Administered 2021-03-23 – 2021-03-25 (×3): 5 mg via ORAL
  Filled 2021-03-23 (×4): qty 1

## 2021-03-23 MED ORDER — DULOXETINE HCL 60 MG PO CPEP
60.0000 mg | ORAL_CAPSULE | Freq: Every day | ORAL | Status: DC
  Administered 2021-03-23 – 2021-03-27 (×5): 60 mg via ORAL
  Filled 2021-03-23 (×5): qty 1

## 2021-03-23 MED ORDER — ACETAMINOPHEN 650 MG RE SUPP
650.0000 mg | Freq: Four times a day (QID) | RECTAL | Status: DC | PRN
Start: 2021-03-22 — End: 2021-03-28

## 2021-03-23 MED ORDER — SODIUM CHLORIDE 0.9 % IV SOLN
1.0000 g | INTRAVENOUS | Status: DC
  Administered 2021-03-23 – 2021-03-26 (×3): 1 g via INTRAVENOUS
  Filled 2021-03-23 (×3): qty 10

## 2021-03-23 MED ORDER — LORAZEPAM 0.5 MG PO TABS
0.5000 mg | ORAL_TABLET | Freq: Three times a day (TID) | ORAL | Status: DC | PRN
  Filled 2021-03-23: qty 1

## 2021-03-23 MED ORDER — ACETAMINOPHEN 325 MG PO TABS
650.0000 mg | ORAL_TABLET | Freq: Four times a day (QID) | ORAL | Status: DC | PRN
  Administered 2021-03-25: 650 mg via ORAL
  Filled 2021-03-23 (×2): qty 2

## 2021-03-23 MED ORDER — INSULIN ASPART 100 UNIT/ML IJ SOLN
0.0000 [IU] | Freq: Every day | INTRAMUSCULAR | Status: DC
  Administered 2021-03-23: 2 [IU] via SUBCUTANEOUS

## 2021-03-23 MED ORDER — INSULIN ASPART 100 UNIT/ML IJ SOLN
0.0000 [IU] | Freq: Three times a day (TID) | INTRAMUSCULAR | Status: DC
  Administered 2021-03-23: 2 [IU] via SUBCUTANEOUS
  Administered 2021-03-23: 5 [IU] via SUBCUTANEOUS
  Administered 2021-03-24: 2 [IU] via SUBCUTANEOUS
  Administered 2021-03-24 (×2): 5 [IU] via SUBCUTANEOUS
  Administered 2021-03-25 – 2021-03-27 (×5): 2 [IU] via SUBCUTANEOUS

## 2021-03-23 MED ORDER — ONDANSETRON HCL 4 MG/2ML IJ SOLN: 4.0000 mg | Freq: Four times a day (QID) | INTRAMUSCULAR | Status: DC | PRN

## 2021-03-23 MED ORDER — ALBUTEROL SULFATE HFA 108 (90 BASE) MCG/ACT IN AERS: 2.0000 | INHALATION_SPRAY | Freq: Four times a day (QID) | RESPIRATORY_TRACT | Status: DC | PRN

## 2021-03-23 MED ORDER — AMLODIPINE BESYLATE 5 MG PO TABS
10.0000 mg | ORAL_TABLET | Freq: Every day | ORAL | Status: DC
  Administered 2021-03-23 – 2021-03-24 (×2): 10 mg via ORAL
  Filled 2021-03-23 (×2): qty 2

## 2021-03-23 MED ORDER — MONTELUKAST SODIUM 10 MG PO TABS
10.0000 mg | ORAL_TABLET | Freq: Every day | ORAL | Status: DC
  Administered 2021-03-23 – 2021-03-25 (×3): 10 mg via ORAL
  Filled 2021-03-23 (×4): qty 1

## 2021-03-23 MED ORDER — APIXABAN 5 MG PO TABS
5.0000 mg | ORAL_TABLET | Freq: Two times a day (BID) | ORAL | Status: DC
  Administered 2021-03-23 – 2021-03-27 (×9): 5 mg via ORAL
  Filled 2021-03-23 (×10): qty 1

## 2021-03-23 MED ORDER — GABAPENTIN 300 MG PO CAPS
300.0000 mg | ORAL_CAPSULE | Freq: Three times a day (TID) | ORAL | Status: DC
  Administered 2021-03-23 – 2021-03-27 (×13): 300 mg via ORAL
  Filled 2021-03-23 (×14): qty 1

## 2021-03-23 MED ORDER — UMECLIDINIUM BROMIDE 62.5 MCG/INH IN AEPB: 1.0000 | INHALATION_SPRAY | Freq: Every day | RESPIRATORY_TRACT | Status: DC | PRN

## 2021-03-23 MED ORDER — TIOTROPIUM BROMIDE MONOHYDRATE 18 MCG IN CAPS: 18.0000 ug | ORAL_CAPSULE | Freq: Every day | RESPIRATORY_TRACT | Status: DC | PRN

## 2021-03-23 NOTE — ED Notes (Signed)
Patient's son, [name] called for an update on the patient's condition, explained POC and that she was being admitted to hospital, son verbalized understanding.

## 2021-03-23 NOTE — Progress Notes (Signed)
Pt being followed by [facility] for Sepsis Protocol.

## 2021-03-23 NOTE — ED Notes (Addendum)
This RN spoke with the daughter of patient, Tomasa Hosteller. Daughter became upset that [name], her brother is being contacted about patient POC and updates and not herself. Daughter stated "I'm a nurse and you are contacting my brother who is an alcoholic and doesn't know anymore than my mother". This RN explained to patient that this is something that would need to be handled between family members, asked if patient has a POA, and daughter stated no. This RN again reiterated that patient would need this in place if she wanted to ensure that she was contacted and not the brother, but this is not something that a RN in the ED would handle for her. Daughter verbalized understanding and stated that she is coming to see her Mother.

## 2021-03-23 NOTE — Progress Notes (Signed)
PROGRESS NOTE    Patient: [name]                            PCP: Caprice Renshaw, MD                    DOB: 1966-10-12            DOA: 03/22/2021 [id number]             DOS: 03/23/2021, 8:08 AM   LOS: 0 days   Date of Service: The patient was seen and examined on 03/23/2021  Subjective:   The patient was seen and examined this morning. Stable at this time. Still complaining of : Lower extremity chronic wound, edema Mentation back to baseline, son and daughter present at bedside  no issues overnight .  Brief Narrative:   [name]  is a 55 y.o.f with h/o tobacco abuse, P-a.fib, CM, Sz,  HTN, HLD,,CHF, DMI II hypertension, COPD, CKD IIIa ... From nursing home facility, presented to ED with altered mental status.    Found hypoxic, placed on 3 L of oxygen, she was found hypoglycemic with CBG of 59,44,37 She was given an amp of D50 and started on dextrose drip.  She is also found to have a UTI.   Patient was started on Rocephin.  Her last culture grew staph aureus that was sensitive to Cipro.   In the ED Patient was afebrile with a temp of 98.4, heart rate remains slightly tachycardic from 103-110, respiratory rate 14-22, blood pressure 106/65 satting 100% on 2 L nasal cannula Leukocytosis 12.3, hemoglobin 11.7 Creatinine at baseline 2.06 Respiratory panel negative UA indicative of UTI EKG without ischemic changes    Assessment & Plan:   Active Problems:   HTN (hypertension), malignant   Acute lower UTI   Atrial fibrillation, controlled ([place])   Acute metabolic encephalopathy   Hypoglycemia associated with diabetes ([place])     1. Acute Metabolic encephalopathy 1. Multifactorial - UTI and hypoglycemic 2. CT head without acute abnormality 3. Rocephin started >>  4. Blood cultures >>> no growth to date 5. Urine culture >> no growth vibrating  6. Likely due to sepsis 7. Continue to monitor  2. Hypoglycemia - in the setting of DMII 1. Hold home meds 2. 1  amp given in ED>>> monitor CBG closely 3. At this point on D5 half-normal saline 4. Monitor glucose q2H  (146, 120, 128, 132 at 7:53 AM)  3. UTI with possible sepsis 1. Met sepsis criteria on admission with heart HR 110, RR 22, WBC 12.3, altered mental status, elevated creatinine 2.13, hyperglycemia, (lactic acid normal 0.7, 0.8)--- source of infection UTI 2. Improving sepsis physiology 3. UA indicative of UTI 4. We will follow-up Blood and urine cultures 5. Per sepsis protocol, continue with IV fluid resuscitation, 6. Continue Rocephin  4. Mod pro cal mal 1. Continue Ensure 2. Albumin 2.3 3. Continue Ensure  5. Paroxysmal atrial fib 1. Continue Eliquis and lopressor 2. Monitoring on telemetry for  6. HTN 1. Mildly hypotensive--- monitoring BP closely 2. On norvasc, and lopressor (may hold) 7. HLD 1. Continue statin 8. DMII -hypoglycemic 1. Holding home meds as above 2. -Currently on D5 half-normal saline, checking blood sugar every 2 hours   9.  Skin Assessment: I have examined the patient's skin and I agree with the wound assessment as performed by wound care team As outlined belowe: Pressure Injury 12/03/20 Perineum  Posterior Stage 1 -  Intact skin with non-blanchable redness of a localized area usually over a bony prominence. (Active)  12/03/20 2210  Location: Perineum  Location Orientation: Posterior  Staging: Stage 1 -  Intact skin with non-blanchable redness of a localized area usually over a bony prominence.  Wound Description (Comments):   Present on Admission: Yes     Pressure Injury 12/04/20 Coccyx Right;Medial Stage 2 -  Partial thickness loss of dermis presenting as a shallow open injury with a red, pink wound bed without slough. 1cm x 0.5cm area to the right of previously healed pressure ulcer (Active)  12/04/20 0800  Location: Coccyx  Location Orientation: Right;Medial  Staging: Stage 2 -  Partial thickness loss of dermis presenting as a shallow open  injury with a red, pink wound bed without slough.  Wound Description (Comments): 1cm x 0.5cm area to the right of previously healed pressure ulcer  Present on Admission: Yes     Pressure Injury 03/12/21 Sacrum Mid Stage 2 -  Partial thickness loss of dermis presenting as a shallow open injury with a red, pink wound bed without slough. (Active)  03/12/21 1128  Location: Sacrum  Location Orientation: Mid  Staging: Stage 2 -  Partial thickness loss of dermis presenting as a shallow open injury with a red, pink wound bed without slough.  Wound Description (Comments):   Present on Admission: Yes     Pressure Injury 03/12/21 Thigh Right;Upper Stage 2 -  Partial thickness loss of dermis presenting as a shallow open injury with a red, pink wound bed without slough. (Active)  03/12/21 1131  Location: Thigh  Location Orientation: Right;Upper  Staging: Stage 2 -  Partial thickness loss of dermis presenting as a shallow open injury with a red, pink wound bed without slough.  Wound Description (Comments):   Present on Admission: Yes   -------------------------------------------------------------------------------------------------------------------------------- Nutritional status:  The patient's BMI is: Body mass index is 37.91 kg/m. I agree with the assessment and plan as outlined   ---------------------------------------------------------------------------------------------------------------------------------------------------- Cultures; Blood Cultures x 2 >> NGT Urine Culture  >>> NGT    Antimicrobials: IV Rocephin   Consultants: None   ------------------------------------------------------------------------------------------------------------------------------------------------  DVT prophylaxis:  SCD/Compression stockings and IR:CVELFYB Code Status:   Code Status: Full Code  Family Communication: Patient is now awake alert oriented x3, discussed with patient, daughter and son  present at bedside updated The above findings and plan of care has been discussed with patient (daughter, son)  in detail,  they expressed understanding and agreement of above. -Advance care planning has been discussed.   Admission status:   Status is: Observation  The patient remains OBS appropriate and will d/c before 2 midnights.  Dispo: The patient is from: SNF              Anticipated d/c is to: SNF              Patient currently is not medically stable to d/c.  That she continues to meet sepsis criteria with  altered mental status... Needing aggressive treatment IV fluids, IV antibiotics, close monitoring   Difficult to place patient No      Level of care: Telemetry   Procedures:   No admission procedures for hospital encounter.     Antimicrobials:  Anti-infectives (From admission, onward)   Start     Dose/Rate Route Frequency Ordered Stop   03/23/21 1900  cefTRIAXone (ROCEPHIN) 1 g in sodium chloride 0.9 % 100 mL IVPB  1 g 200 mL/hr over 30 Minutes Intravenous Every 24 hours 03/23/21 0121     03/22/21 2130  cefTRIAXone (ROCEPHIN) 2 g in sodium chloride 0.9 % 100 mL IVPB        2 g 200 mL/hr over 30 Minutes Intravenous  Once 03/22/21 2125 03/22/21 2246       Medication:  . amLODipine  10 mg Oral Daily  . apixaban  5 mg Oral BID  . DULoxetine  60 mg Oral Daily  . feeding supplement  237 mL Oral BID BM  . gabapentin  300 mg Oral TID  . insulin aspart  0-15 Units Subcutaneous TID WC  . insulin aspart  0-5 Units Subcutaneous QHS  . levETIRAcetam  750 mg Oral BID  . metoprolol tartrate  25 mg Oral BID  . montelukast  10 mg Oral QHS  . oxybutynin  5 mg Oral QHS  . pantoprazole  40 mg Oral Daily  . rosuvastatin  10 mg Oral Daily  . traZODone  50 mg Oral QHS    acetaminophen **OR** acetaminophen, albuterol, LORazepam, ondansetron **OR** ondansetron (ZOFRAN) IV, oxyCODONE, oxyCODONE, umeclidinium bromide   Objective:   Vitals:   03/23/21 0330  03/23/21 0400 03/23/21 0500 03/23/21 0600  BP: (!) 124/95 (!) 119/97 92/82 114/84  Pulse:  99 96 92  Resp: _0 Temp:    97.9 F (36.6 C)  TempSrc:    Oral  SpO2: 100% 98% 100% 99%  Weight:      Height:       No intake or output data in the 24 hours ending 03/23/21 0808 Filed Weights   03/22/21 1534  Weight: 91 kg     Examination:   Physical Exam  Constitution:  Alert, cooperative, no distress,  Appears calm and comfortable  Psychiatric: Normal and stable mood and affect, cognition intact,   HEENT: Normocephalic, PERRL, otherwise with in Normal limits  Chest:Chest symmetric Cardio vascular:  S1/S2, RRR, No murmure, No Rubs or Gallops  pulmonary: Clear to auscultation bilaterally, respirations unlabored, negative wheezes / crackles Abdomen: Soft, non-tender, non-distended, bowel sounds,no masses, no organomegaly Muscular skeletal:  Severe generalized weaknesses-bedbound  Limited exam - in bed, able to move all 4 extremities,  Neuro: CNII-XII intact. , normal motor and sensation, reflexes intact  Extremities: No pitting edema lower extremities, +2 pulses  Skin: Dry, warm to touch, negative for any Rashes, lower extremity chronic wounds Wounds: per nursing documentation -  Pressure Injury 12/03/20 Perineum Posterior Stage 1 -  Intact skin with non-blanchable redness of a localized area usually over a bony prominence. (Active)  12/03/20 2210  Location: Perineum  Location Orientation: Posterior  Staging: Stage 1 -  Intact skin with non-blanchable redness of a localized area usually over a bony prominence.  Wound Description (Comments):   Present on Admission: Yes     Pressure Injury 12/04/20 Coccyx Right;Medial Stage 2 -  Partial thickness loss of dermis presenting as a shallow open injury with a red, pink wound bed without slough. 1cm x 0.5cm area to the right of previously healed pressure ulcer (Active)  12/04/20 0800  Location: Coccyx  Location Orientation:  Right;Medial  Staging: Stage 2 -  Partial thickness loss of dermis presenting as a shallow open injury with a red, pink wound bed without slough.  Wound Description (Comments): 1cm x 0.5cm area to the right of previously healed pressure ulcer  Present on Admission: Yes     Pressure Injury 03/12/21 Sacrum Mid Stage 2 -  Partial thickness loss of dermis presenting as a shallow open injury with a red, pink wound bed without slough. (Active)  03/12/21 1128  Location: Sacrum  Location Orientation: Mid  Staging: Stage 2 -  Partial thickness loss of dermis presenting as a shallow open injury with a red, pink wound bed without slough.  Wound Description (Comments):   Present on Admission: Yes     Pressure Injury 03/12/21 Thigh Right;Upper Stage 2 -  Partial thickness loss of dermis presenting as a shallow open injury with a red, pink wound bed without slough. (Active)  03/12/21 1131  Location: Thigh  Location Orientation: Right;Upper  Staging: Stage 2 -  Partial thickness loss of dermis presenting as a shallow open injury with a red, pink wound bed without slough.  Wound Description (Comments):   Present on Admission: Yes    ------------------------------------------------------------------------------------------------------------------------------------------    LABs:  CBC Latest Ref Rng & Units 03/23/2021 03/22/2021 03/14/2021  WBC 4.0 - 10.5 K/uL 11.8(H) 12.3(H) 6.4  Hemoglobin 12.0 - 15.0 g/dL 9.0(L) 11.7(L) 9.8(L)  Hematocrit 36.0 - 46.0 % 30.7(L) 40.9 33.0(L)  Platelets 150 - 400 K/uL 278 292 193   CMP Latest Ref Rng & Units 03/23/2021 03/22/2021 03/14/2021  Glucose 70 - 99 mg/dL 167(H) 59(L) 304(H)  BUN 6 - 20 mg/dL 34(H) 35(H) 45(H)  Creatinine 0.44 - 1.00 mg/dL 2.13(H) 2.06(H) 2.00(H)  Sodium 135 - 145 mmol/L 142 140 141  Potassium 3.5 - 5.1 mmol/L 4.7 5.3(H) 4.2  Chloride 98 - 111 mmol/L 107 105 105  CO2 22 - 32 mmol/L _0 Calcium 8.9 - 10.3 mg/dL 8.4(L) 9.3 8.4(L)  Total  Protein 6.5 - 8.1 g/dL 5.9(L) 7.5 -  Total Bilirubin 0.3 - 1.2 mg/dL 0.4 0.7 -  Alkaline Phos 38 - 126 U/L 55 72 -  AST 15 - 41 U/L 11(L) 13(L) -  ALT 0 - 44 U/L 9 11 -       Micro Results Recent Results (from the past 240 hour(s))  Urine Culture     Status: None   Collection Time: 03/14/21  4:55 PM   Specimen: Urine, Clean Catch  Result Value Ref Range Status   Specimen Description   Final    URINE, CLEAN CATCH Performed at [facility], [address] [place], [place] [address]    Special Requests   Final    NONE Performed at [facility], [address], [place], [place] [address]    Culture   Final    NO GROWTH Performed at [facility], [place] [address] [place], [place] [address]    Report Status 03/16/2021 FINAL  Final  Resp Panel by RT-PCR (Flu A&B, Covid) Nasopharyngeal Swab     Status: None   Collection Time: 03/22/21 10:08 PM   Specimen: Nasopharyngeal Swab; Nasopharyngeal(NP) swabs in vial transport medium  Result Value Ref Range Status   SARS Coronavirus 2 by RT PCR NEGATIVE NEGATIVE Final    Comment: (NOTE) SARS-CoV-2 target nucleic acids are NOT DETECTED.  The SARS-CoV-2 RNA is generally detectable in upper respiratory specimens during the acute phase of infection. The lowest concentration of SARS-CoV-2 viral copies this assay can detect is 138 copies/mL. A negative result does not preclude SARS-Cov-2 infection and should not be used as the sole basis for treatment or other patient management decisions. A negative result may occur with  improper specimen collection/handling, submission of specimen other than nasopharyngeal swab, presence of viral mutation(s) within the areas targeted by this assay,  and inadequate number of viral copies(<138 copies/mL). A negative result must be combined with clinical observations, patient history, and epidemiological information. The expected result is Negative.  Fact Sheet for Patients:   EntrepreneurPulse.com.au  Fact Sheet for Healthcare Providers:  IncredibleEmployment.be  This test is no t yet approved or cleared by the [place] FDA and  has been authorized for detection and/or diagnosis of SARS-CoV-2 by FDA under an Emergency Use Authorization (EUA). This EUA will remain  in effect (meaning this test can be used) for the duration of the COVID-19 declaration under Section 564(b)(1) of the Act, 21 U.S.C.section 360bbb-3(b)(1), unless the authorization is terminated  or revoked sooner.       Influenza A by PCR NEGATIVE NEGATIVE Final   Influenza B by PCR NEGATIVE NEGATIVE Final    Comment: (NOTE) The Xpert Xpress SARS-CoV-2/FLU/RSV plus assay is intended as an aid in the diagnosis of influenza from Nasopharyngeal swab specimens and should not be used as a sole basis for treatment. Nasal washings and aspirates are unacceptable for Xpert Xpress SARS-CoV-2/FLU/RSV testing.  Fact Sheet for Patients: EntrepreneurPulse.com.au  Fact Sheet for Healthcare Providers: IncredibleEmployment.be  This test is not yet approved or cleared by the [place] FDA and has been authorized for detection and/or diagnosis of SARS-CoV-2 by FDA under an Emergency Use Authorization (EUA). This EUA will remain in effect (meaning this test can be used) for the duration of the COVID-19 declaration under Section 564(b)(1) of the Act, 21 U.S.C. section 360bbb-3(b)(1), unless the authorization is terminated or revoked.  Performed at [facility], [address] [place], [place] [address]     Radiology Reports CT HEAD WO CONTRAST  Result Date: 03/22/2021 CLINICAL DATA:  Altered mental status EXAM: CT HEAD WITHOUT CONTRAST TECHNIQUE: Contiguous axial images were obtained from the base of the skull through the vertex without intravenous contrast. COMPARISON:  Mar 11, 2021, August 15, 2020 FINDINGS: Brain: No  evidence of acute infarction, hemorrhage, hydrocephalus, extra-axial collection or mass lesion/mass effect. Periventricular white matter hypodensities consistent with sequela of chronic microvascular ischemic disease. Remote lacunar infarction of the LEFT thalamus and caudate. Vascular: Vascular calcifications of the carotid siphons. Skull: No acute fracture.  Remote LEFT medial orbital wall fracture. Sinuses/Orbits: No acute finding. Other: None. IMPRESSION: No acute intracranial abnormality. Electronically Signed   By: Valentino Saxon MD   On: 03/22/2021 16:20   CT Head Wo Contrast  Result Date: 03/11/2021 CLINICAL DATA:  Delirium altered EXAM: CT HEAD WITHOUT CONTRAST TECHNIQUE: Contiguous axial images were obtained from the base of the skull through the vertex without intravenous contrast. COMPARISON:  CT 08/13/2020, MRI 08/15/2020 FINDINGS: Brain: No acute territorial infarction, hemorrhage, or intracranial mass. Mild atrophy. Chronic lacunar infarct within the left thalamus and left caudate. Moderate patchy white matter hypodensity consistent with chronic small vessel ischemic change. Stable ventricle size. Vascular: No hyperdense vessels.  Carotid vascular calcification Skull: Normal. Negative for fracture or focal lesion. Sinuses/Orbits: Old left medial orbital wall fracture. Mild mucosal thickening in the sinuses. Other: None IMPRESSION: 1. No CT evidence for acute intracranial abnormality. 2. Atrophy and chronic small vessel ischemic changes. Electronically Signed   By: Donavan Foil M.D.   On: 03/11/2021 17:32   DG Chest Port 1 View  Result Date: 03/09/2021 CLINICAL DATA:  Tachycardia EXAM: PORTABLE CHEST 1 VIEW COMPARISON:  December 03, 2020 FINDINGS: The lungs are clear. There is cardiomegaly with pulmonary vascularity normal. No adenopathy. No bone lesions. IMPRESSION: Cardiomegaly.  No edema or airspace opacity.  Electronically Signed   By: Lowella Grip III M.D.   On: 03/09/2021 17:39     SIGNED: Deatra [name], MD, FHM. [facility],  Pager (please use [email or web address] to page/text) Please use Epic Secure Chat for non-urgent communication (7AM-7PM)  If 7PM-7AM, please contact night-coverage [email or web address], 03/23/2021, 8:08 AM

## 2021-03-23 NOTE — ED Notes (Signed)
Secure messaged Dr. Clearence Ped regarding pt's medications, Keppra and Eliquis are scheduled for 1000 and oxybutynin and singulair scheduled for 2200, all also scheduled for now r/t orders being released. Per Dr. Clearence Ped administer keppra and eliquis now, hold other meds at this time

## 2021-03-23 NOTE — Plan of Care (Signed)
  Problem: Education: Goal: Knowledge of General Education information will improve Description Including pain rating scale, medication(s)/side effects and non-pharmacologic comfort measures Outcome: Progressing   Problem: Health Behavior/Discharge Planning: Goal: Ability to manage health-related needs will improve Outcome: Progressing   

## 2021-03-23 NOTE — ED Notes (Signed)
Pt resting quietly in bed, purewick in place functioning without complication. Morning labs obtained and sent. POC glucose obtained. IVF infusing without difficulty. Pt provided water and diet soda as requested. VS updated and stable. Pt denies further needs at this time. Side rails up x 2, bed locked and low, call bell within reach. Will continue to monitor.

## 2021-03-24 DIAGNOSIS — G9341 Metabolic encephalopathy: Secondary | ICD-10-CM | POA: Diagnosis not present

## 2021-03-24 DIAGNOSIS — N390 Urinary tract infection, site not specified: Secondary | ICD-10-CM | POA: Diagnosis not present

## 2021-03-24 DIAGNOSIS — I4891 Unspecified atrial fibrillation: Secondary | ICD-10-CM | POA: Diagnosis not present

## 2021-03-24 DIAGNOSIS — I10 Essential (primary) hypertension: Secondary | ICD-10-CM | POA: Diagnosis not present

## 2021-03-24 LAB — BASIC METABOLIC PANEL
Anion gap: 5 (ref 5–15)
BUN: 31 mg/dL — ABNORMAL HIGH (ref 6–20)
CO2: 27 mmol/L (ref 22–32)
Calcium: 8.5 mg/dL — ABNORMAL LOW (ref 8.9–10.3)
Chloride: 106 mmol/L (ref 98–111)
Creatinine, Ser: 2.07 mg/dL — ABNORMAL HIGH (ref 0.44–1.00)
GFR, Estimated: 28 mL/min — ABNORMAL LOW (ref >60)
Glucose, Bld: 150 mg/dL — ABNORMAL HIGH (ref 70–99)
Potassium: 4.7 mmol/L (ref 3.5–5.1)
Sodium: 138 mmol/L (ref 135–145)

## 2021-03-24 LAB — GLUCOSE, CAPILLARY
Glucose-Capillary: 246 mg/dL — ABNORMAL HIGH (ref 70–99)
Glucose-Capillary: 217 mg/dL — ABNORMAL HIGH (ref 70–99)
Glucose-Capillary: 138 mg/dL — ABNORMAL HIGH (ref 70–99)
Glucose-Capillary: 117 mg/dL — ABNORMAL HIGH (ref 70–99)

## 2021-03-24 LAB — MAGNESIUM: Magnesium: 1.7 mg/dL (ref 1.7–2.4)

## 2021-03-24 NOTE — Progress Notes (Signed)
PROGRESS NOTE    Patient: [name]                            PCP: Caprice Renshaw, MD                    DOB: Jul 24, 1966            DOA: 03/22/2021 GYJ:[id number]             DOS: 03/24/2021, 11:32 AM   LOS: 1 day   Date of Service: The patient was seen and examined on 03/24/2021  Subjective:   The patient was seen examined this morning, awake alert oriented...  Still complain generalized weaknesses, lower extremity pain tenderness, discomfort  Brief Narrative:   [name]  is a 55 y.o.f with h/o tobacco abuse, P-a.fib, CM, Sz,  HTN, HLD,,CHF, DMI II hypertension, COPD, CKD IIIa ... From nursing home facility, presented to ED with altered mental status.    Found hypoxic, placed on 3 L of oxygen, she was found hypoglycemic with CBG of 59,44,37 She was given an amp of D50 and started on dextrose drip.  She is also found to have a UTI.   Patient was started on Rocephin.  Her last culture grew staph aureus that was sensitive to Cipro.   In the ED Patient was afebrile with a temp of 98.4, heart rate remains slightly tachycardic from 103-110, respiratory rate 14-22, blood pressure 106/65 satting 100% on 2 L nasal cannula Leukocytosis 12.3, hemoglobin 11.7 Creatinine at baseline 2.06 Respiratory panel negative UA indicative of UTI EKG without ischemic changes    Assessment & Plan:   Active Problems:   HTN (hypertension), malignant   Acute lower UTI   Atrial fibrillation, controlled ([place])   Acute metabolic encephalopathy   Hypoglycemia associated with diabetes ([name])   Sepsis ([place])     1. Acute Metabolic encephalopathy 1. Mentation back to baseline, oriented x3 2. Multifactorial - UTI and hypoglycemic 3. CT head without acute abnormality 4. Rocephin started >>  5. Blood cultures>>> no growth to date 6. Urine culture >> no growth vibrating  7. Likely due to sepsis 8. Continue to monitor  2. Hypoglycemia - in the setting of DMII 1. Much improved 2. Hold home  meds 3. 1 amp given in ED>>> monitor CBG closely 4. Status post D5 half-normal saline, discontinued now 5. Blood sugars has improved, switching CBG to q. ACH S, 6. Last CBG 203, 220, 246, 217  3. UTI with possible sepsis 1. Resolved sepsis physiology 2. Met sepsis criteria on admission with heart HR 110, RR 22, WBC 12.3, altered mental status, elevated creatinine 2.13, hyperglycemia, (lactic acid normal 0.7, 0.8)--- source of infection UTI 3. Improving sepsis physiology 4. UA indicative of UTI 5. We will follow-up Blood and urine cultures 6. Per sepsis protocol, continue with IV fluid resuscitation, 7. Continue Rocephin  4. Mod pro cal mal 1. Continue Ensure 2. Albumin 2.3 3. Continue Ensure  5. Paroxysmal atrial fib 1. Continue Eliquis and lopressor 2. Monitoring on telemetry  3. Stable  6. HTN 1. Mildly hypotensive--- monitoring BP closely 2. Blood pressure remained soft but stable 3. Continue to withhold home medication Norvasc, continue Lopressor 7. HLD 1. Continue statin 8. DMII -hypoglycemic 1. Holding home meds as above 2. Blood sugars has improved, discontinue D5 half-normal saline 3. -Checking blood sugar QA CHS, SSI coverage 4. Last CBG 203, 220, 246, 217  9.  Skin Assessment: I have examined the patient's skin and I agree with the wound assessment as performed by wound care team As outlined belowe: Pressure Injury 12/03/20 Perineum Posterior Stage 1 -  Intact skin with non-blanchable redness of a localized area usually over a bony prominence. (Active)  12/03/20 2210  Location: Perineum  Location Orientation: Posterior  Staging: Stage 1 -  Intact skin with non-blanchable redness of a localized area usually over a bony prominence.  Wound Description (Comments):   Present on Admission: Yes     Pressure Injury 12/04/20 Coccyx Right;Medial Stage 2 -  Partial thickness loss of dermis presenting as a shallow open injury with a red, pink wound bed without slough.  1cm x 0.5cm area to the right of previously healed pressure ulcer (Active)  12/04/20 0800  Location: Coccyx  Location Orientation: Right;Medial  Staging: Stage 2 -  Partial thickness loss of dermis presenting as a shallow open injury with a red, pink wound bed without slough.  Wound Description (Comments): 1cm x 0.5cm area to the right of previously healed pressure ulcer  Present on Admission: Yes     Pressure Injury 03/12/21 Sacrum Mid Stage 2 -  Partial thickness loss of dermis presenting as a shallow open injury with a red, pink wound bed without slough. (Active)  03/12/21 1128  Location: Sacrum  Location Orientation: Mid  Staging: Stage 2 -  Partial thickness loss of dermis presenting as a shallow open injury with a red, pink wound bed without slough.  Wound Description (Comments):   Present on Admission: Yes     Pressure Injury 03/12/21 Thigh Right;Upper Stage 2 -  Partial thickness loss of dermis presenting as a shallow open injury with a red, pink wound bed without slough. (Active)  03/12/21 1131  Location: Thigh  Location Orientation: Right;Upper  Staging: Stage 2 -  Partial thickness loss of dermis presenting as a shallow open injury with a red, pink wound bed without slough.  Wound Description (Comments):   Present on Admission: Yes     Pressure Injury 03/23/21 Pretibial Distal;Left Stage 3 -  Full thickness tissue loss. Subcutaneous fat may be visible but bone, tendon or muscle are NOT exposed. 7 cm X 4.5 cm no tunneling (Active)  03/23/21 1344  Location: Pretibial  Location Orientation: Distal;Left  Staging: Stage 3 -  Full thickness tissue loss. Subcutaneous fat may be visible but bone, tendon or muscle are NOT exposed.  Wound Description (Comments): 7 cm X 4.5 cm no tunneling  Present on Admission: Yes     Pressure Injury 03/23/21 Tibial Left;Posterior;Proximal Stage 3 -  Full thickness tissue loss. Subcutaneous fat may be visible but bone, tendon or muscle are NOT  exposed. 1 cm X 1 Cm with yellow sloth present (Active)  03/23/21 1357  Location: Tibial  Location Orientation: Left;Posterior;Proximal  Staging: Stage 3 -  Full thickness tissue loss. Subcutaneous fat may be visible but bone, tendon or muscle are NOT exposed.  Wound Description (Comments): 1 cm X 1 Cm with yellow sloth present  Present on Admission: Yes     Pressure Injury 03/23/21 Thigh Anterior;Left Stage 2 -  Partial thickness loss of dermis presenting as a shallow open injury with a red, pink wound bed without slough. 2 cm X 0.5 cm pink wound bed (Active)  03/23/21 1358  Location: Thigh  Location Orientation: Anterior;Left  Staging: Stage 2 -  Partial thickness loss of dermis presenting as a shallow open injury with a red, pink wound  bed without slough.  Wound Description (Comments): 2 cm X 0.5 cm pink wound bed  Present on Admission:      Pressure Injury 03/23/21 Thigh Anterior;Right Stage 2 -  Partial thickness loss of dermis presenting as a shallow open injury with a red, pink wound bed without slough. 2 cm x 1 cm pink wound bed (Active)  03/23/21 1400  Location: Thigh  Location Orientation: Anterior;Right  Staging: Stage 2 -  Partial thickness loss of dermis presenting as a shallow open injury with a red, pink wound bed without slough.  Wound Description (Comments): 2 cm x 1 cm pink wound bed  Present on Admission: Yes     Pressure Injury 03/23/21 Buttocks Right;Posterior Stage 2 -  Partial thickness loss of dermis presenting as a shallow open injury with a red, pink wound bed without slough. 0.5 cm x 0.5 cm pink wound bed (Active)  03/23/21 1402  Location: Buttocks  Location Orientation: Right;Posterior  Staging: Stage 2 -  Partial thickness loss of dermis presenting as a shallow open injury with a red, pink wound bed without slough.  Wound Description (Comments): 0.5 cm x 0.5 cm pink wound bed  Present on Admission: Yes     Pressure Injury 03/23/21 Buttocks Right Stage 2 -   Partial thickness loss of dermis presenting as a shallow open injury with a red, pink wound bed without slough. 3 cm x 1 cm pink wound bed (Active)  03/23/21 1404  Location: Buttocks  Location Orientation: Right  Staging: Stage 2 -  Partial thickness loss of dermis presenting as a shallow open injury with a red, pink wound bed without slough.  Wound Description (Comments): 3 cm x 1 cm pink wound bed  Present on Admission: Yes   -------------------------------------------------------------------------------------------------------------------------------- Nutritional status:  The patient's BMI is: Body mass index is 37.91 kg/m. I agree with the assessment and plan as outlined   ---------------------------------------------------------------------------------------------------------------------------------------------------- Cultures; Blood Cultures x 2 >> NGT Urine Culture  >>> NGT    Antimicrobials: IV Rocephin   Consultants: None ---------------------------------------------------------------------------------------------------------------------------------------------  DVT prophylaxis:  SCD/Compression stockings and UG:QBVQXIH Code Status:   Code Status: Full Code  Family Communication: Patient is now awake alert oriented x3, discussed with patient, daughter and son present at bedside updated The above findings and plan of care has been discussed with patient (daughter, son)  in detail,  they expressed understanding and agreement of above. -Advance care planning has been discussed.   Admission status:   Status is: Inpatient  Patient meets inpatient criteria with continued need of close monitoring for hypoglycemia, sepsis IV antibiotics  Dispo: The patient is from: SNF              Anticipated d/c is to: SNF in 1 to 2 days              Patient currently is not medically stable to d/c.  That she continues to meet sepsis criteria with  altered mental status... Needing  aggressive treatment IV fluids, IV antibiotics, close monitoring   Difficult to place patient No      Level of care: Med-Surg   Procedures:   No admission procedures for hospital encounter.     Antimicrobials:  Anti-infectives (From admission, onward)   Start     Dose/Rate Route Frequency Ordered Stop   03/23/21 1900  cefTRIAXone (ROCEPHIN) 1 g in sodium chloride 0.9 % 100 mL IVPB        1 g 200 mL/hr over 30 Minutes Intravenous Every 24  hours 03/23/21 0121     03/22/21 2130  cefTRIAXone (ROCEPHIN) 2 g in sodium chloride 0.9 % 100 mL IVPB        2 g 200 mL/hr over 30 Minutes Intravenous  Once 03/22/21 2125 03/22/21 2246       Medication:  . amLODipine  10 mg Oral Daily  . apixaban  5 mg Oral BID  . DULoxetine  60 mg Oral Daily  . feeding supplement  237 mL Oral BID BM  . gabapentin  300 mg Oral TID  . insulin aspart  0-15 Units Subcutaneous TID WC  . insulin aspart  0-5 Units Subcutaneous QHS  . levETIRAcetam  750 mg Oral BID  . metoprolol tartrate  25 mg Oral BID  . montelukast  10 mg Oral QHS  . oxybutynin  5 mg Oral QHS  . pantoprazole  40 mg Oral Daily  . rosuvastatin  10 mg Oral Daily  . traZODone  50 mg Oral QHS    acetaminophen **OR** acetaminophen, albuterol, LORazepam, ondansetron **OR** ondansetron (ZOFRAN) IV, oxyCODONE, oxyCODONE, umeclidinium bromide   Objective:   Vitals:   03/23/21 2049 03/24/21 0050 03/24/21 0432 03/24/21 0500  BP: 94/81 110/78 109/69   Pulse: 81 84 100   Resp: '19 19 19   ' Temp: 98.1 F (36.7 C) 98.2 F (36.8 C) 98.2 F (36.8 C)   TempSrc:  Oral    SpO2: 99% 98% 97%   Weight:    91.5 kg  Height:        Intake/Output Summary (Last 24 hours) at 03/24/2021 1132 Last data filed at 03/24/2021 0900 Gross per 24 hour  Intake 2350.69 ml  Output 500 ml  Net 1850.69 ml   Filed Weights   03/22/21 1534 03/24/21 0500  Weight: 91 kg 91.5 kg     Examination:      Physical Exam:   General:  Alert, oriented,  cooperative, no distress;   HEENT:  Normocephalic, PERRL, otherwise with in Normal limits   Neuro:  CNII-XII intact. , normal motor and sensation, reflexes intact   Lungs:   Clear to auscultation BL, Respirations unlabored, no wheezes / crackles  Cardio:    S1/S2, RRR, No murmure, No Rubs or Gallops   Abdomen:   Soft, non-tender, bowel sounds active all four quadrants,  no guarding or peritoneal signs.  Muscular skeletal:   Severe debility, bedbound .  Minimal movement of lower extremities in bed  Able to move upper extremities..  Significant bilateral symmetric reduced strength in lower extremities, pressure ulcers, foot deformities  Skin:  Dry, warm to touch, with lesions as described below  Wounds: Please see nursing documentation Pressure Injury 12/03/20 Perineum Posterior Stage 1 -  Intact skin with non-blanchable redness of a localized area usually over a bony prominence. (Active)  12/03/20 2210  Location: Perineum  Location Orientation: Posterior  Staging: Stage 1 -  Intact skin with non-blanchable redness of a localized area usually over a bony prominence.  Wound Description (Comments):   Present on Admission: Yes     Pressure Injury 12/04/20 Coccyx Right;Medial Stage 2 -  Partial thickness loss of dermis presenting as a shallow open injury with a red, pink wound bed without slough. 1cm x 0.5cm area to the right of previously healed pressure ulcer (Active)  12/04/20 0800  Location: Coccyx  Location Orientation: Right;Medial  Staging: Stage 2 -  Partial thickness loss of dermis presenting as a shallow open injury with a red, pink wound bed without slough.  Wound Description (Comments): 1cm x 0.5cm area to the right of previously healed pressure ulcer  Present on Admission: Yes     Pressure Injury 03/12/21 Sacrum Mid Stage 2 -  Partial thickness loss of dermis presenting as a shallow open injury with a red, pink wound bed without slough. (Active)  03/12/21 1128  Location: Sacrum   Location Orientation: Mid  Staging: Stage 2 -  Partial thickness loss of dermis presenting as a shallow open injury with a red, pink wound bed without slough.  Wound Description (Comments):   Present on Admission: Yes     Pressure Injury 03/12/21 Thigh Right;Upper Stage 2 -  Partial thickness loss of dermis presenting as a shallow open injury with a red, pink wound bed without slough. (Active)  03/12/21 1131  Location: Thigh  Location Orientation: Right;Upper  Staging: Stage 2 -  Partial thickness loss of dermis presenting as a shallow open injury with a red, pink wound bed without slough.  Wound Description (Comments):   Present on Admission: Yes     Pressure Injury 03/23/21 Pretibial Distal;Left Stage 3 -  Full thickness tissue loss. Subcutaneous fat may be visible but bone, tendon or muscle are NOT exposed. 7 cm X 4.5 cm no tunneling (Active)  03/23/21 1344  Location: Pretibial  Location Orientation: Distal;Left  Staging: Stage 3 -  Full thickness tissue loss. Subcutaneous fat may be visible but bone, tendon or muscle are NOT exposed.  Wound Description (Comments): 7 cm X 4.5 cm no tunneling  Present on Admission: Yes     Pressure Injury 03/23/21 Tibial Left;Posterior;Proximal Stage 3 -  Full thickness tissue loss. Subcutaneous fat may be visible but bone, tendon or muscle are NOT exposed. 1 cm X 1 Cm with yellow sloth present (Active)  03/23/21 1357  Location: Tibial  Location Orientation: Left;Posterior;Proximal  Staging: Stage 3 -  Full thickness tissue loss. Subcutaneous fat may be visible but bone, tendon or muscle are NOT exposed.  Wound Description (Comments): 1 cm X 1 Cm with yellow sloth present  Present on Admission: Yes     Pressure Injury 03/23/21 Thigh Anterior;Left Stage 2 -  Partial thickness loss of dermis presenting as a shallow open injury with a red, pink wound bed without slough. 2 cm X 0.5 cm pink wound bed (Active)  03/23/21 1358  Location: Thigh  Location  Orientation: Anterior;Left  Staging: Stage 2 -  Partial thickness loss of dermis presenting as a shallow open injury with a red, pink wound bed without slough.  Wound Description (Comments): 2 cm X 0.5 cm pink wound bed  Present on Admission:      Pressure Injury 03/23/21 Thigh Anterior;Right Stage 2 -  Partial thickness loss of dermis presenting as a shallow open injury with a red, pink wound bed without slough. 2 cm x 1 cm pink wound bed (Active)  03/23/21 1400  Location: Thigh  Location Orientation: Anterior;Right  Staging: Stage 2 -  Partial thickness loss of dermis presenting as a shallow open injury with a red, pink wound bed without slough.  Wound Description (Comments): 2 cm x 1 cm pink wound bed  Present on Admission: Yes     Pressure Injury 03/23/21 Buttocks Right;Posterior Stage 2 -  Partial thickness loss of dermis presenting as a shallow open injury with a red, pink wound bed without slough. 0.5 cm x 0.5 cm pink wound bed (Active)  03/23/21 1402  Location: Buttocks  Location Orientation: Right;Posterior  Staging: Stage 2 -  Partial  thickness loss of dermis presenting as a shallow open injury with a red, pink wound bed without slough.  Wound Description (Comments): 0.5 cm x 0.5 cm pink wound bed  Present on Admission: Yes     Pressure Injury 03/23/21 Buttocks Right Stage 2 -  Partial thickness loss of dermis presenting as a shallow open injury with a red, pink wound bed without slough. 3 cm x 1 cm pink wound bed (Active)  03/23/21 1404  Location: Buttocks  Location Orientation: Right  Staging: Stage 2 -  Partial thickness loss of dermis presenting as a shallow open injury with a red, pink wound bed without slough.  Wound Description (Comments): 3 cm x 1 cm pink wound bed  Present on Admission: Yes         ------------------------------------------------------------------------------------------------------------------------------------------    LABs:  CBC Latest Ref  Rng & Units 03/23/2021 03/22/2021 03/14/2021  WBC 4.0 - 10.5 K/uL 11.8(H) 12.3(H) 6.4  Hemoglobin 12.0 - 15.0 g/dL 9.0(L) 11.7(L) 9.8(L)  Hematocrit 36.0 - 46.0 % 30.7(L) 40.9 33.0(L)  Platelets 150 - 400 K/uL 278 292 193   CMP Latest Ref Rng & Units 03/23/2021 03/22/2021 03/14/2021  Glucose 70 - 99 mg/dL 167(H) 59(L) 304(H)  BUN 6 - 20 mg/dL 34(H) 35(H) 45(H)  Creatinine 0.44 - 1.00 mg/dL 2.13(H) 2.06(H) 2.00(H)  Sodium 135 - 145 mmol/L 142 140 141  Potassium 3.5 - 5.1 mmol/L 4.7 5.3(H) 4.2  Chloride 98 - 111 mmol/L 107 105 105  CO2 22 - 32 mmol/L '27 29 27  ' Calcium 8.9 - 10.3 mg/dL 8.4(L) 9.3 8.4(L)  Total Protein 6.5 - 8.1 g/dL 5.9(L) 7.5 -  Total Bilirubin 0.3 - 1.2 mg/dL 0.4 0.7 -  Alkaline Phos 38 - 126 U/L 55 72 -  AST 15 - 41 U/L 11(L) 13(L) -  ALT 0 - 44 U/L 9 11 -       Micro Results Recent Results (from the past 240 hour(s))  Urine Culture     Status: None   Collection Time: 03/14/21  4:55 PM   Specimen: Urine, Clean Catch  Result Value Ref Range Status   Specimen Description   Final    URINE, CLEAN CATCH Performed at [facility], [address] [place], [place] [address]    Special Requests   Final    NONE Performed at [facility], [address], [place], [place] [address]    Culture   Final    NO GROWTH Performed at [facility], [place] [address], [place], [place] [address]    Report Status 03/16/2021 FINAL  Final  Resp Panel by RT-PCR (Flu A&B, Covid) Nasopharyngeal Swab     Status: None   Collection Time: 03/22/21 10:08 PM   Specimen: Nasopharyngeal Swab; Nasopharyngeal(NP) swabs in vial transport medium  Result Value Ref Range Status   SARS Coronavirus 2 by RT PCR NEGATIVE NEGATIVE Final    Comment: (NOTE) SARS-CoV-2 target nucleic acids are NOT DETECTED.  The SARS-CoV-2 RNA is generally detectable in upper respiratory specimens during the acute phase of infection. The lowest concentration of SARS-CoV-2 viral copies this assay can detect is 138  copies/mL. A negative result does not preclude SARS-Cov-2 infection and should not be used as the sole basis for treatment or other patient management decisions. A negative result may occur with  improper specimen collection/handling, submission of specimen other than nasopharyngeal swab, presence of viral mutation(s) within the areas targeted by this assay, and inadequate number of viral copies(<138 copies/mL). A negative  result must be combined with clinical observations, patient history, and epidemiological information. The expected result is Negative.  Fact Sheet for Patients:  EntrepreneurPulse.com.au  Fact Sheet for Healthcare Providers:  IncredibleEmployment.be  This test is no t yet approved or cleared by the [place] FDA and  has been authorized for detection and/or diagnosis of SARS-CoV-2 by FDA under an Emergency Use Authorization (EUA). This EUA will remain  in effect (meaning this test can be used) for the duration of the COVID-19 declaration under Section 564(b)(1) of the Act, 21 U.S.C.section 360bbb-3(b)(1), unless the authorization is terminated  or revoked sooner.       Influenza A by PCR NEGATIVE NEGATIVE Final   Influenza B by PCR NEGATIVE NEGATIVE Final    Comment: (NOTE) The Xpert Xpress SARS-CoV-2/FLU/RSV plus assay is intended as an aid in the diagnosis of influenza from Nasopharyngeal swab specimens and should not be used as a sole basis for treatment. Nasal washings and aspirates are unacceptable for Xpert Xpress SARS-CoV-2/FLU/RSV testing.  Fact Sheet for Patients: EntrepreneurPulse.com.au  Fact Sheet for Healthcare Providers: IncredibleEmployment.be  This test is not yet approved or cleared by the [place] FDA and has been authorized for detection and/or diagnosis of SARS-CoV-2 by FDA under an Emergency Use Authorization (EUA). This EUA will remain in effect (meaning  this test can be used) for the duration of the COVID-19 declaration under Section 564(b)(1) of the Act, 21 U.S.C. section 360bbb-3(b)(1), unless the authorization is terminated or revoked.  Performed at [facility], [address] [place], [place] [address]   Blood Culture (routine x 2)     Status: None (Preliminary result)   Collection Time: 03/22/21 10:39 PM   Specimen: BLOOD RIGHT ARM  Result Value Ref Range Status   Specimen Description BLOOD RIGHT ARM  Final   Special Requests   Final    BOTTLES DRAWN AEROBIC AND ANAEROBIC Blood Culture adequate volume   Culture   Final    NO GROWTH 2 DAYS Performed at [facility], [address] [place], [place] [address]    Report Status PENDING  Incomplete  Blood Culture (routine x 2)     Status: None (Preliminary result)   Collection Time: 03/22/21 10:45 PM   Specimen: BLOOD LEFT ARM  Result Value Ref Range Status   Specimen Description BLOOD LEFT ARM  Final   Special Requests   Final    BOTTLES DRAWN AEROBIC AND ANAEROBIC Blood Culture adequate volume   Culture   Final    NO GROWTH 2 DAYS Performed at [facility], [address], [place], [place] [address]    Report Status PENDING  Incomplete  MRSA PCR Screening     Status: None   Collection Time: 03/23/21  2:40 AM   Specimen: Nasal Mucosa; Nasopharyngeal  Result Value Ref Range Status   MRSA by PCR NEGATIVE NEGATIVE Final    Comment:        The GeneXpert MRSA Assay (FDA approved for NASAL specimens only), is one component of a comprehensive MRSA colonization surveillance program. It is not intended to diagnose MRSA infection nor to guide or monitor treatment for MRSA infections. Performed at [facility], [address] [place], [place] [address]     Radiology Reports CT HEAD WO CONTRAST  Result Date: 03/22/2021 CLINICAL DATA:  Altered mental status EXAM: CT HEAD WITHOUT CONTRAST TECHNIQUE: Contiguous axial images were obtained from the base of the skull through the  vertex without intravenous contrast. COMPARISON:  Mar 11, 2021,  August 15, 2020 FINDINGS: Brain: No evidence of acute infarction, hemorrhage, hydrocephalus, extra-axial collection or mass lesion/mass effect. Periventricular white matter hypodensities consistent with sequela of chronic microvascular ischemic disease. Remote lacunar infarction of the LEFT thalamus and caudate. Vascular: Vascular calcifications of the carotid siphons. Skull: No acute fracture.  Remote LEFT medial orbital wall fracture. Sinuses/Orbits: No acute finding. Other: None. IMPRESSION: No acute intracranial abnormality. Electronically Signed   By: Valentino Saxon MD   On: 03/22/2021 16:20   CT Head Wo Contrast  Result Date: 03/11/2021 CLINICAL DATA:  Delirium altered EXAM: CT HEAD WITHOUT CONTRAST TECHNIQUE: Contiguous axial images were obtained from the base of the skull through the vertex without intravenous contrast. COMPARISON:  CT 08/13/2020, MRI 08/15/2020 FINDINGS: Brain: No acute territorial infarction, hemorrhage, or intracranial mass. Mild atrophy. Chronic lacunar infarct within the left thalamus and left caudate. Moderate patchy white matter hypodensity consistent with chronic small vessel ischemic change. Stable ventricle size. Vascular: No hyperdense vessels.  Carotid vascular calcification Skull: Normal. Negative for fracture or focal lesion. Sinuses/Orbits: Old left medial orbital wall fracture. Mild mucosal thickening in the sinuses. Other: None IMPRESSION: 1. No CT evidence for acute intracranial abnormality. 2. Atrophy and chronic small vessel ischemic changes. Electronically Signed   By: Donavan Foil M.D.   On: 03/11/2021 17:32   DG Chest Port 1 View  Result Date: 03/09/2021 CLINICAL DATA:  Tachycardia EXAM: PORTABLE CHEST 1 VIEW COMPARISON:  December 03, 2020 FINDINGS: The lungs are clear. There is cardiomegaly with pulmonary vascularity normal. No adenopathy. No bone lesions. IMPRESSION: Cardiomegaly.  No edema  or airspace opacity. Electronically Signed   By: Lowella Grip III M.D.   On: 03/09/2021 17:39    SIGNED: Deatra [name], MD, FHM. [facility],  Pager (please use [email or web address] to page/text) Please use Epic Secure Chat for non-urgent communication (7AM-7PM)  If 7PM-7AM, please contact night-coverage [email or web address], 03/24/2021, 11:32 AM

## 2021-03-25 ENCOUNTER — Telehealth: Payer: Self-pay

## 2021-03-25 DIAGNOSIS — N390 Urinary tract infection, site not specified: Secondary | ICD-10-CM | POA: Diagnosis not present

## 2021-03-25 DIAGNOSIS — I4891 Unspecified atrial fibrillation: Secondary | ICD-10-CM | POA: Diagnosis not present

## 2021-03-25 DIAGNOSIS — E11649 Type 2 diabetes mellitus with hypoglycemia without coma: Secondary | ICD-10-CM | POA: Diagnosis not present

## 2021-03-25 DIAGNOSIS — G9341 Metabolic encephalopathy: Secondary | ICD-10-CM | POA: Diagnosis not present

## 2021-03-25 LAB — HEMOGLOBIN A1C
Hgb A1c MFr Bld: 11.1 % — ABNORMAL HIGH (ref 4.8–5.6)
Mean Plasma Glucose: 272 mg/dL

## 2021-03-25 LAB — GLUCOSE, CAPILLARY
Glucose-Capillary: 44 mg/dL — CL (ref 70–99)
Glucose-Capillary: 141 mg/dL — ABNORMAL HIGH (ref 70–99)
Glucose-Capillary: 153 mg/dL — ABNORMAL HIGH (ref 70–99)

## 2021-03-25 LAB — URINE CULTURE: Culture: NO GROWTH

## 2021-03-25 NOTE — Progress Notes (Signed)
This nurse entered the patient's room due to her yellow "hey! Someone come in here!" When I entered the room, the patient was talking to someone on the phone and said "come talk to him." When I asked "who is on the phone?," she said, "I don't know, ask him." When I took the phone, the caller identified himself as 911 dispatcher. This nurse informed him that patient's needs would be addressed and no further action was needed. While on the phone, the patient continued to yell "I'm leaving, I'm going home." When I got off of the phone, I attempted to ask the patient what she needed but she continued to speak an yell over me. This nurse left the room and told the patient that yelling and disruptive behavior was inappropriate.

## 2021-03-25 NOTE — Telephone Encounter (Signed)
Needing a Hospital F/U appointment for bleeding of the bladder.    Please call coordinator at home. Ask for Delcie Roch:  [phone]  Thanks, Helene Kelp

## 2021-03-25 NOTE — Progress Notes (Signed)
PROGRESS NOTE    Patient: [name]                            PCP: Caprice Renshaw, MD                    DOB: 11/24/1965            DOA: 03/22/2021 [id number]             DOS: 03/25/2021, 11:40 AM   LOS: 2 days   Date of Service: The patient was seen and examined on 03/25/2021  Subjective:   The patient was seen and examined this morning, awake alert oriented no acute distress. Complain lower extremity pain discomfort. Denies any dysuria   Brief Narrative:   [name]  is a 55 y.o.f with h/o tobacco abuse, P-a.fib, CM, Sz,  HTN, HLD,,CHF, DMI II hypertension, COPD, CKD IIIa ... From nursing home facility, presented to ED with altered mental status.    Found hypoxic, placed on 3 L of oxygen, she was found hypoglycemic with CBG of 59,44,37 She was given an amp of D50 and started on dextrose drip.  She is also found to have a UTI.   Patient was started on Rocephin.  Her last culture grew staph aureus that was sensitive to Cipro.   In the ED Patient was afebrile with a temp of 98.4, heart rate remains slightly tachycardic from 103-110, respiratory rate 14-22, blood pressure 106/65 satting 100% on 2 L nasal cannula Leukocytosis 12.3, hemoglobin 11.7 Creatinine at baseline 2.06 Respiratory panel negative UA indicative of UTI EKG without ischemic changes    Assessment & Plan:   Active Problems:   HTN (hypertension), malignant   Acute lower UTI   Atrial fibrillation, controlled ([place])   Acute metabolic encephalopathy   Hypoglycemia associated with diabetes ([place])   Sepsis ([place])     1. Acute Metabolic encephalopathy 1. Back to baseline, mentation has improved alert and oriented x3 2. Multifactorial - UTI and hypoglycemic 3. CT head without acute abnormality 4. Rocephin started >>  5. Blood cultures>>> no growth to date 6. Urine culture >> no growth vibrating  7. Likely due to sepsis 8. Continue to monitor  2. Hypoglycemia - in the setting of DMII 1. Much  improved 2. Hold home meds 3. 1 amp given in ED>>> monitor CBG closely 4. Status post D5 half-normal saline, discontinued 5/30 5. Blood sugars has improved, switching CBG to q. ACH S, 6. Last CBG 138, 117, 141  3. UTI with possible sepsis 1. Resolved sepsis physiology 2. Met sepsis criteria on admission with heart HR 110, RR 22, WBC 12.3, altered mental status, elevated creatinine 2.13, hyperglycemia, (lactic acid normal 0.7, 0.8)--- source of infection UTI 3. Improving sepsis physiology 4. UA indicative of UTI 5. We will follow-up Blood and urine cultures >>> no growth to date 6. Per sepsis protocol, continue with IV fluid resuscitation, 7. Continue Rocephin  4. Mod pro cal mal 1. Continue Ensure 2. Albumin 2.3 3. Continue Ensure  5. Paroxysmal atrial fib 1. Continue Eliquis and lopressor 2. Monitoring on telemetry  3. Stable  6. HTN 1. Mildly hypotensive--- monitoring BP closely 2. Blood pressure remained soft but stable 3. Continue to withhold home medication Norvasc, continue Lopressor 4. Stable 7. HLD 1. Continue statin 2. Stable 8. DMII -hypoglycemic 1. Holding home meds as above 2. Blood sugars has improved, discontinue D5 half-normal saline 3. -Checking  blood sugar QA CHS, SSI coverage 4. Last CBG 138, 117, 141   9.  Skin Assessment: I have examined the patient's skin and I agree with the wound assessment as performed by wound care team As outlined belowe: Pressure Injury 12/03/20 Perineum Posterior Stage 1 -  Intact skin with non-blanchable redness of a localized area usually over a bony prominence. (Active)  12/03/20 2210  Location: Perineum  Location Orientation: Posterior  Staging: Stage 1 -  Intact skin with non-blanchable redness of a localized area usually over a bony prominence.  Wound Description (Comments):   Present on Admission: Yes     Pressure Injury 12/04/20 Coccyx Right;Medial Stage 2 -  Partial thickness loss of dermis presenting as a  shallow open injury with a red, pink wound bed without slough. 1cm x 0.5cm area to the right of previously healed pressure ulcer (Active)  12/04/20 0800  Location: Coccyx  Location Orientation: Right;Medial  Staging: Stage 2 -  Partial thickness loss of dermis presenting as a shallow open injury with a red, pink wound bed without slough.  Wound Description (Comments): 1cm x 0.5cm area to the right of previously healed pressure ulcer  Present on Admission: Yes     Pressure Injury 03/12/21 Sacrum Mid Stage 2 -  Partial thickness loss of dermis presenting as a shallow open injury with a red, pink wound bed without slough. (Active)  03/12/21 1128  Location: Sacrum  Location Orientation: Mid  Staging: Stage 2 -  Partial thickness loss of dermis presenting as a shallow open injury with a red, pink wound bed without slough.  Wound Description (Comments):   Present on Admission: Yes     Pressure Injury 03/12/21 Thigh Right;Upper Stage 2 -  Partial thickness loss of dermis presenting as a shallow open injury with a red, pink wound bed without slough. (Active)  03/12/21 1131  Location: Thigh  Location Orientation: Right;Upper  Staging: Stage 2 -  Partial thickness loss of dermis presenting as a shallow open injury with a red, pink wound bed without slough.  Wound Description (Comments):   Present on Admission: Yes     Pressure Injury 03/23/21 Pretibial Distal;Left Stage 3 -  Full thickness tissue loss. Subcutaneous fat may be visible but bone, tendon or muscle are NOT exposed. 7 cm X 4.5 cm no tunneling (Active)  03/23/21 1344  Location: Pretibial  Location Orientation: Distal;Left  Staging: Stage 3 -  Full thickness tissue loss. Subcutaneous fat may be visible but bone, tendon or muscle are NOT exposed.  Wound Description (Comments): 7 cm X 4.5 cm no tunneling  Present on Admission: Yes     Pressure Injury 03/23/21 Tibial Left;Posterior;Proximal Stage 3 -  Full thickness tissue loss.  Subcutaneous fat may be visible but bone, tendon or muscle are NOT exposed. 1 cm X 1 Cm with yellow sloth present (Active)  03/23/21 1357  Location: Tibial  Location Orientation: Left;Posterior;Proximal  Staging: Stage 3 -  Full thickness tissue loss. Subcutaneous fat may be visible but bone, tendon or muscle are NOT exposed.  Wound Description (Comments): 1 cm X 1 Cm with yellow sloth present  Present on Admission: Yes     Pressure Injury 03/23/21 Thigh Anterior;Left Stage 2 -  Partial thickness loss of dermis presenting as a shallow open injury with a red, pink wound bed without slough. 2 cm X 0.5 cm pink wound bed (Active)  03/23/21 1358  Location: Thigh  Location Orientation: Anterior;Left  Staging: Stage 2 -  Partial thickness  loss of dermis presenting as a shallow open injury with a red, pink wound bed without slough.  Wound Description (Comments): 2 cm X 0.5 cm pink wound bed  Present on Admission:      Pressure Injury 03/23/21 Thigh Anterior;Right Stage 2 -  Partial thickness loss of dermis presenting as a shallow open injury with a red, pink wound bed without slough. 2 cm x 1 cm pink wound bed (Active)  03/23/21 1400  Location: Thigh  Location Orientation: Anterior;Right  Staging: Stage 2 -  Partial thickness loss of dermis presenting as a shallow open injury with a red, pink wound bed without slough.  Wound Description (Comments): 2 cm x 1 cm pink wound bed  Present on Admission: Yes     Pressure Injury 03/23/21 Buttocks Right;Posterior Stage 2 -  Partial thickness loss of dermis presenting as a shallow open injury with a red, pink wound bed without slough. 0.5 cm x 0.5 cm pink wound bed (Active)  03/23/21 1402  Location: Buttocks  Location Orientation: Right;Posterior  Staging: Stage 2 -  Partial thickness loss of dermis presenting as a shallow open injury with a red, pink wound bed without slough.  Wound Description (Comments): 0.5 cm x 0.5 cm pink wound bed  Present on  Admission: Yes     Pressure Injury 03/23/21 Buttocks Right Stage 2 -  Partial thickness loss of dermis presenting as a shallow open injury with a red, pink wound bed without slough. 3 cm x 1 cm pink wound bed (Active)  03/23/21 1404  Location: Buttocks  Location Orientation: Right  Staging: Stage 2 -  Partial thickness loss of dermis presenting as a shallow open injury with a red, pink wound bed without slough.  Wound Description (Comments): 3 cm x 1 cm pink wound bed  Present on Admission: Yes   -------------------------------------------------------------------------------------------------------------------------------- Nutritional status:  The patient's BMI is: Body mass index is 37.91 kg/m. I agree with the assessment and plan as outlined   ---------------------------------------------------------------------------------------------------------------------------------------------------- Cultures; Blood Cultures x 2 >> NGT Urine Culture  >>> NGT    Antimicrobials: IV Rocephin   Consultants: None ---------------------------------------------------------------------------------------------------------------------------------------------  DVT prophylaxis:  SCD/Compression stockings and YM:EBRAXEN Code Status:   Code Status: Full Code  Family Communication: Patient is now awake alert oriented x3, discussed with patient, daughter and son present at bedside updated The above findings and plan of care has been discussed with patient (daughter, son)  in detail,  they expressed understanding and agreement of above. -Advance care planning has been discussed.   Admission status:   Status is: Inpatient  Patient meets inpatient criteria with continued need of close monitoring for hypoglycemia, sepsis IV antibiotics  Dispo: The patient is from: SNF              Anticipated d/c is to: SNF in 1 to 2 days              Patient currently is not medically stable to d/c.  That she  continues to meet sepsis criteria with  altered mental status... Needing aggressive treatment IV fluids, IV antibiotics, close monitoring   Difficult to place patient No      Level of care: Med-Surg   Procedures:   No admission procedures for hospital encounter.     Antimicrobials:  Anti-infectives (From admission, onward)   Start     Dose/Rate Route Frequency Ordered Stop   03/23/21 1900  cefTRIAXone (ROCEPHIN) 1 g in sodium chloride 0.9 % 100 mL IVPB  1 g 200 mL/hr over 30 Minutes Intravenous Every 24 hours 03/23/21 0121     03/22/21 2130  cefTRIAXone (ROCEPHIN) 2 g in sodium chloride 0.9 % 100 mL IVPB        2 g 200 mL/hr over 30 Minutes Intravenous  Once 03/22/21 2125 03/22/21 2246       Medication:  . apixaban  5 mg Oral BID  . DULoxetine  60 mg Oral Daily  . feeding supplement  237 mL Oral BID BM  . gabapentin  300 mg Oral TID  . insulin aspart  0-15 Units Subcutaneous TID WC  . insulin aspart  0-5 Units Subcutaneous QHS  . levETIRAcetam  750 mg Oral BID  . metoprolol tartrate  25 mg Oral BID  . montelukast  10 mg Oral QHS  . oxybutynin  5 mg Oral QHS  . pantoprazole  40 mg Oral Daily  . rosuvastatin  10 mg Oral Daily  . traZODone  50 mg Oral QHS    acetaminophen **OR** acetaminophen, albuterol, LORazepam, ondansetron **OR** ondansetron (ZOFRAN) IV, oxyCODONE, oxyCODONE, umeclidinium bromide   Objective:   Vitals:   03/24/21 1335 03/24/21 2127 03/25/21 0602 03/25/21 0603  BP: '99/80 95/71 93/76 '   Pulse: 64 100 99 (!) 107  Resp: '18 18 18 18  ' Temp: 98.9 F (37.2 C) 99.3 F (37.4 C) 99.4 F (37.4 C)   TempSrc:  Oral Oral   SpO2: 97% 97% (!) 76% 98%  Weight:      Height:        Intake/Output Summary (Last 24 hours) at 03/25/2021 1140 Last data filed at 03/25/2021 0615 Gross per 24 hour  Intake 240 ml  Output 1000 ml  Net -760 ml   Filed Weights   03/22/21 1534 03/24/21 0500  Weight: 91 kg 91.5 kg     Examination:      Physical  Exam:   General:  Alert, oriented, cooperative, no distress;   HEENT:  Normocephalic, PERRL, otherwise with in Normal limits   Neuro:  CNII-XII intact. , normal motor and sensation, reflexes intact   Lungs:   Clear to auscultation BL, Respirations unlabored, no wheezes / crackles  Cardio:    S1/S2, RRR, No murmure, No Rubs or Gallops   Abdomen:   Soft, non-tender, bowel sounds active all four quadrants,  no guarding or peritoneal signs.  Muscular skeletal:  Limited exam - in bed, able to move all 4 extremities, Normal strength,  2+ pulses,  symmetric, No pitting edema  Skin:  Dry, warm to touch, negative for any Rashes,  Wounds: Please see nursing documentation Pressure Injury 12/03/20 Perineum Posterior Stage 1 -  Intact skin with non-blanchable redness of a localized area usually over a bony prominence. (Active)  12/03/20 2210  Location: Perineum  Location Orientation: Posterior  Staging: Stage 1 -  Intact skin with non-blanchable redness of a localized area usually over a bony prominence.  Wound Description (Comments):   Present on Admission: Yes     Pressure Injury 12/04/20 Coccyx Right;Medial Stage 2 -  Partial thickness loss of dermis presenting as a shallow open injury with a red, pink wound bed without slough. 1cm x 0.5cm area to the right of previously healed pressure ulcer (Active)  12/04/20 0800  Location: Coccyx  Location Orientation: Right;Medial  Staging: Stage 2 -  Partial thickness loss of dermis presenting as a shallow open injury with a red, pink wound bed without slough.  Wound Description (Comments): 1cm x 0.5cm area to the  right of previously healed pressure ulcer  Present on Admission: Yes     Pressure Injury 03/12/21 Sacrum Mid Stage 2 -  Partial thickness loss of dermis presenting as a shallow open injury with a red, pink wound bed without slough. (Active)  03/12/21 1128  Location: Sacrum  Location Orientation: Mid  Staging: Stage 2 -  Partial thickness loss of  dermis presenting as a shallow open injury with a red, pink wound bed without slough.  Wound Description (Comments):   Present on Admission: Yes     Pressure Injury 03/12/21 Thigh Right;Upper Stage 2 -  Partial thickness loss of dermis presenting as a shallow open injury with a red, pink wound bed without slough. (Active)  03/12/21 1131  Location: Thigh  Location Orientation: Right;Upper  Staging: Stage 2 -  Partial thickness loss of dermis presenting as a shallow open injury with a red, pink wound bed without slough.  Wound Description (Comments):   Present on Admission: Yes     Pressure Injury 03/23/21 Pretibial Distal;Left Stage 3 -  Full thickness tissue loss. Subcutaneous fat may be visible but bone, tendon or muscle are NOT exposed. 7 cm X 4.5 cm no tunneling (Active)  03/23/21 1344  Location: Pretibial  Location Orientation: Distal;Left  Staging: Stage 3 -  Full thickness tissue loss. Subcutaneous fat may be visible but bone, tendon or muscle are NOT exposed.  Wound Description (Comments): 7 cm X 4.5 cm no tunneling  Present on Admission: Yes     Pressure Injury 03/23/21 Tibial Left;Posterior;Proximal Stage 3 -  Full thickness tissue loss. Subcutaneous fat may be visible but bone, tendon or muscle are NOT exposed. 1 cm X 1 Cm with yellow sloth present (Active)  03/23/21 1357  Location: Tibial  Location Orientation: Left;Posterior;Proximal  Staging: Stage 3 -  Full thickness tissue loss. Subcutaneous fat may be visible but bone, tendon or muscle are NOT exposed.  Wound Description (Comments): 1 cm X 1 Cm with yellow sloth present  Present on Admission: Yes     Pressure Injury 03/23/21 Thigh Anterior;Left Stage 2 -  Partial thickness loss of dermis presenting as a shallow open injury with a red, pink wound bed without slough. 2 cm X 0.5 cm pink wound bed (Active)  03/23/21 1358  Location: Thigh  Location Orientation: Anterior;Left  Staging: Stage 2 -  Partial thickness loss of  dermis presenting as a shallow open injury with a red, pink wound bed without slough.  Wound Description (Comments): 2 cm X 0.5 cm pink wound bed  Present on Admission:      Pressure Injury 03/23/21 Thigh Anterior;Right Stage 2 -  Partial thickness loss of dermis presenting as a shallow open injury with a red, pink wound bed without slough. 2 cm x 1 cm pink wound bed (Active)  03/23/21 1400  Location: Thigh  Location Orientation: Anterior;Right  Staging: Stage 2 -  Partial thickness loss of dermis presenting as a shallow open injury with a red, pink wound bed without slough.  Wound Description (Comments): 2 cm x 1 cm pink wound bed  Present on Admission: Yes     Pressure Injury 03/23/21 Buttocks Right;Posterior Stage 2 -  Partial thickness loss of dermis presenting as a shallow open injury with a red, pink wound bed without slough. 0.5 cm x 0.5 cm pink wound bed (Active)  03/23/21 1402  Location: Buttocks  Location Orientation: Right;Posterior  Staging: Stage 2 -  Partial thickness loss of dermis presenting as a shallow open  injury with a red, pink wound bed without slough.  Wound Description (Comments): 0.5 cm x 0.5 cm pink wound bed  Present on Admission: Yes     Pressure Injury 03/23/21 Buttocks Right Stage 2 -  Partial thickness loss of dermis presenting as a shallow open injury with a red, pink wound bed without slough. 3 cm x 1 cm pink wound bed (Active)  03/23/21 1404  Location: Buttocks  Location Orientation: Right  Staging: Stage 2 -  Partial thickness loss of dermis presenting as a shallow open injury with a red, pink wound bed without slough.  Wound Description (Comments): 3 cm x 1 cm pink wound bed  Present on Admission: Yes            ------------------------------------------------------------------------------------------------------------------------------------------    LABs:  CBC Latest Ref Rng & Units 03/23/2021 03/22/2021 03/14/2021  WBC 4.0 - 10.5 K/uL 11.8(H)  12.3(H) 6.4  Hemoglobin 12.0 - 15.0 g/dL 9.0(L) 11.7(L) 9.8(L)  Hematocrit 36.0 - 46.0 % 30.7(L) 40.9 33.0(L)  Platelets 150 - 400 K/uL 278 292 193   CMP Latest Ref Rng & Units 03/24/2021 03/23/2021 03/22/2021  Glucose 70 - 99 mg/dL 150(H) 167(H) 59(L)  BUN 6 - 20 mg/dL 31(H) 34(H) 35(H)  Creatinine 0.44 - 1.00 mg/dL 2.07(H) 2.13(H) 2.06(H)  Sodium 135 - 145 mmol/L 138 142 140  Potassium 3.5 - 5.1 mmol/L 4.7 4.7 5.3(H)  Chloride 98 - 111 mmol/L 106 107 105  CO2 22 - 32 mmol/L '27 27 29  ' Calcium 8.9 - 10.3 mg/dL 8.5(L) 8.4(L) 9.3  Total Protein 6.5 - 8.1 g/dL - 5.9(L) 7.5  Total Bilirubin 0.3 - 1.2 mg/dL - 0.4 0.7  Alkaline Phos 38 - 126 U/L - 55 72  AST 15 - 41 U/L - 11(L) 13(L)  ALT 0 - 44 U/L - 9 11       Micro Results Recent Results (from the past 240 hour(s))  Urine Culture     Status: None   Collection Time: 03/22/21  8:55 PM   Specimen: Urine, Catheterized  Result Value Ref Range Status   Specimen Description   Final    URINE, CATHETERIZED Performed at [facility], [address] [place], [place] [address]    Special Requests   Final    NONE Performed at [facility], [address], [place], [place] [address]    Culture   Final    NO GROWTH Performed at [facility], [place] [address] [place], [place] [address]    Report Status 03/25/2021 FINAL  Final  Resp Panel by RT-PCR (Flu A&B, Covid) Nasopharyngeal Swab     Status: None   Collection Time: 03/22/21 10:08 PM   Specimen: Nasopharyngeal Swab; Nasopharyngeal(NP) swabs in vial transport medium  Result Value Ref Range Status   SARS Coronavirus 2 by RT PCR NEGATIVE NEGATIVE Final    Comment: (NOTE) SARS-CoV-2 target nucleic acids are NOT DETECTED.  The SARS-CoV-2 RNA is generally detectable in upper respiratory specimens during the acute phase of infection. The lowest concentration of SARS-CoV-2 viral copies this assay can detect is 138 copies/mL. A negative result does not preclude SARS-Cov-2 infection and  should not be used as the sole basis for treatment or other patient management decisions. A negative result may occur with  improper specimen collection/handling, submission of specimen other than nasopharyngeal swab, presence of viral mutation(s) within the areas targeted by this assay, and inadequate number of viral copies(<138 copies/mL). A negative result must be combined with clinical observations, patient  history, and epidemiological information. The expected result is Negative.  Fact Sheet for Patients:  EntrepreneurPulse.com.au  Fact Sheet for Healthcare Providers:  IncredibleEmployment.be  This test is no t yet approved or cleared by the [place] FDA and  has been authorized for detection and/or diagnosis of SARS-CoV-2 by FDA under an Emergency Use Authorization (EUA). This EUA will remain  in effect (meaning this test can be used) for the duration of the COVID-19 declaration under Section 564(b)(1) of the Act, 21 U.S.C.section 360bbb-3(b)(1), unless the authorization is terminated  or revoked sooner.       Influenza A by PCR NEGATIVE NEGATIVE Final   Influenza B by PCR NEGATIVE NEGATIVE Final    Comment: (NOTE) The Xpert Xpress SARS-CoV-2/FLU/RSV plus assay is intended as an aid in the diagnosis of influenza from Nasopharyngeal swab specimens and should not be used as a sole basis for treatment. Nasal washings and aspirates are unacceptable for Xpert Xpress SARS-CoV-2/FLU/RSV testing.  Fact Sheet for Patients: EntrepreneurPulse.com.au  Fact Sheet for Healthcare Providers: IncredibleEmployment.be  This test is not yet approved or cleared by the [place] FDA and has been authorized for detection and/or diagnosis of SARS-CoV-2 by FDA under an Emergency Use Authorization (EUA). This EUA will remain in effect (meaning this test can be used) for the duration of the COVID-19 declaration  under Section 564(b)(1) of the Act, 21 U.S.C. section 360bbb-3(b)(1), unless the authorization is terminated or revoked.  Performed at [facility], [address] [place], [place] [address]   Blood Culture (routine x 2)     Status: None (Preliminary result)   Collection Time: 03/22/21 10:39 PM   Specimen: BLOOD RIGHT ARM  Result Value Ref Range Status   Specimen Description BLOOD RIGHT ARM  Final   Special Requests   Final    BOTTLES DRAWN AEROBIC AND ANAEROBIC Blood Culture adequate volume   Culture   Final    NO GROWTH 3 DAYS Performed at [facility], [address], [place], [place] [address]    Report Status PENDING  Incomplete  Blood Culture (routine x 2)     Status: None (Preliminary result)   Collection Time: 03/22/21 10:45 PM   Specimen: BLOOD LEFT ARM  Result Value Ref Range Status   Specimen Description BLOOD LEFT ARM  Final   Special Requests   Final    BOTTLES DRAWN AEROBIC AND ANAEROBIC Blood Culture adequate volume   Culture   Final    NO GROWTH 3 DAYS Performed at [facility], [address], [place], [place] [address]    Report Status PENDING  Incomplete  MRSA PCR Screening     Status: None   Collection Time: 03/23/21  2:40 AM   Specimen: Nasal Mucosa; Nasopharyngeal  Result Value Ref Range Status   MRSA by PCR NEGATIVE NEGATIVE Final    Comment:        The GeneXpert MRSA Assay (FDA approved for NASAL specimens only), is one component of a comprehensive MRSA colonization surveillance program. It is not intended to diagnose MRSA infection nor to guide or monitor treatment for MRSA infections. Performed at [facility], [address] [place], [place] [address]     Radiology Reports CT HEAD WO CONTRAST  Result Date: 03/22/2021 CLINICAL DATA:  Altered mental status EXAM: CT HEAD WITHOUT CONTRAST TECHNIQUE: Contiguous axial images were obtained from the base of the skull through the vertex without intravenous contrast. COMPARISON:  Mar 11, 2021,  August 15, 2020 FINDINGS: Brain: No evidence of  acute infarction, hemorrhage, hydrocephalus, extra-axial collection or mass lesion/mass effect. Periventricular white matter hypodensities consistent with sequela of chronic microvascular ischemic disease. Remote lacunar infarction of the LEFT thalamus and caudate. Vascular: Vascular calcifications of the carotid siphons. Skull: No acute fracture.  Remote LEFT medial orbital wall fracture. Sinuses/Orbits: No acute finding. Other: None. IMPRESSION: No acute intracranial abnormality. Electronically Signed   By: Valentino Saxon MD   On: 03/22/2021 16:20   CT Head Wo Contrast  Result Date: 03/11/2021 CLINICAL DATA:  Delirium altered EXAM: CT HEAD WITHOUT CONTRAST TECHNIQUE: Contiguous axial images were obtained from the base of the skull through the vertex without intravenous contrast. COMPARISON:  CT 08/13/2020, MRI 08/15/2020 FINDINGS: Brain: No acute territorial infarction, hemorrhage, or intracranial mass. Mild atrophy. Chronic lacunar infarct within the left thalamus and left caudate. Moderate patchy white matter hypodensity consistent with chronic small vessel ischemic change. Stable ventricle size. Vascular: No hyperdense vessels.  Carotid vascular calcification Skull: Normal. Negative for fracture or focal lesion. Sinuses/Orbits: Old left medial orbital wall fracture. Mild mucosal thickening in the sinuses. Other: None IMPRESSION: 1. No CT evidence for acute intracranial abnormality. 2. Atrophy and chronic small vessel ischemic changes. Electronically Signed   By: Donavan Foil M.D.   On: 03/11/2021 17:32   DG Chest Port 1 View  Result Date: 03/09/2021 CLINICAL DATA:  Tachycardia EXAM: PORTABLE CHEST 1 VIEW COMPARISON:  December 03, 2020 FINDINGS: The lungs are clear. There is cardiomegaly with pulmonary vascularity normal. No adenopathy. No bone lesions. IMPRESSION: Cardiomegaly.  No edema or airspace opacity. Electronically Signed   By: Lowella Grip III M.D.   On: 03/09/2021 17:39    SIGNED: Deatra [name], MD, FHM. [facility],  Pager (please use [email or web address] to page/text) Please use Epic Secure Chat for non-urgent communication (7AM-7PM)  If 7PM-7AM, please contact night-coverage [email or web address], 03/25/2021, 11:40 AM

## 2021-03-25 NOTE — Progress Notes (Addendum)
This nurse has tried to regain IV access three separate times today. Patient has refused each attempt stating she will just "rip it out". On this nurses third and final attempt at an IV the patient stated yes and then stated "you come over here and Ill stab your ass with the needle". This nurse left the room. MD aware.

## 2021-03-25 NOTE — Progress Notes (Signed)
This nurse was informed of the patient calling 911, when I entered her room in an attempt to see what she needed/wanted she informed me she was calling 911 and stated "you are holding me hostage". Patient was screaming and yelling over me and would not tell me anything that she needed. I advised the patient calling 911 was inappropriate and not allowed she stated she would do what she wanted.

## 2021-03-26 DIAGNOSIS — A419 Sepsis, unspecified organism: Principal | ICD-10-CM

## 2021-03-26 DIAGNOSIS — G9341 Metabolic encephalopathy: Secondary | ICD-10-CM | POA: Diagnosis not present

## 2021-03-26 DIAGNOSIS — N390 Urinary tract infection, site not specified: Secondary | ICD-10-CM | POA: Diagnosis not present

## 2021-03-26 DIAGNOSIS — N179 Acute kidney failure, unspecified: Secondary | ICD-10-CM

## 2021-03-26 DIAGNOSIS — I4891 Unspecified atrial fibrillation: Secondary | ICD-10-CM | POA: Diagnosis not present

## 2021-03-26 DIAGNOSIS — R6520 Severe sepsis without septic shock: Secondary | ICD-10-CM

## 2021-03-26 DIAGNOSIS — I10 Essential (primary) hypertension: Secondary | ICD-10-CM | POA: Diagnosis not present

## 2021-03-26 LAB — GLUCOSE, CAPILLARY
Glucose-Capillary: 143 mg/dL — ABNORMAL HIGH (ref 70–99)
Glucose-Capillary: 138 mg/dL — ABNORMAL HIGH (ref 70–99)
Glucose-Capillary: 127 mg/dL — ABNORMAL HIGH (ref 70–99)
Glucose-Capillary: 135 mg/dL — ABNORMAL HIGH (ref 70–99)
Glucose-Capillary: 145 mg/dL — ABNORMAL HIGH (ref 70–99)
Glucose-Capillary: 93 mg/dL (ref 70–99)

## 2021-03-26 MED ORDER — NYSTATIN 100000 UNIT/GM EX POWD
Freq: Three times a day (TID) | CUTANEOUS | Status: DC
  Filled 2021-03-26: qty 15

## 2021-03-26 NOTE — Telephone Encounter (Signed)
Spoke with Noami- appt scheduled 6/15

## 2021-03-26 NOTE — Telephone Encounter (Signed)
Returned call to number listed- left message to return call.

## 2021-03-26 NOTE — Progress Notes (Signed)
Pt is A&O x4, has refused all night medication, saying she needed it crushed in applesauce. After crushing all her medicine and mixing it in applesauce, pt refused again, stating she takes her meds whole. Explained to patient that this is not what she initially told me, pt denies saying she wanted them crushed. Requested pt take crushed medication anyway, pt refused. Conversation witnessed by NT.    Attempted to change pt's dressings, turn patient, and get her straight in the bed, pt screamed and swatted at RN and NT the whole time. Pt also has pulled off all telemetry leads and swats when we attempt to reattach. Asked pt multiple times to take her medication, pt refused.  As long as she is left alone, she seems to be resting comfortably, but becomes verbally abusive and violent when we try to do anything with her. Meds have been wasted in sharps with charge RN. MD notified. Will continue to monitor.

## 2021-03-26 NOTE — Progress Notes (Signed)
PROGRESS NOTE    Patient: [name]                            PCP: Caprice Renshaw, MD                    DOB: 1965-12-06            DOA: 03/22/2021 GHW:[id number]             DOS: 03/26/2021, 10:49 AM   LOS: 3 days   Date of Service: The patient was seen and examined on 03/26/2021  Subjective:   The patient was seen and examined this morning, sleepy but easily arousable, cooperative. Afebrile normotensive, O2 sat recorded to be low on room air, will reassess  Brief Narrative:   [name]  is a 55 y.o.f with h/o tobacco abuse, P-a.fib, CM, Sz,  HTN, HLD,,CHF, DMI II hypertension, COPD, CKD IIIa ... From nursing home facility, presented to ED with altered mental status.    Found hypoxic, placed on 3 L of oxygen, she was found hypoglycemic with CBG of 59,44,37 She was given an amp of D50 and started on dextrose drip.  She is also found to have a UTI.   Patient was started on Rocephin.  Her last culture grew staph aureus that was sensitive to Cipro.   In the ED Patient was afebrile with a temp of 98.4, heart rate remains slightly tachycardic from 103-110, respiratory rate 14-22, blood pressure 106/65 satting 100% on 2 L nasal cannula Leukocytosis 12.3, hemoglobin 11.7 Creatinine at baseline 2.06 Respiratory panel negative UA indicative of UTI EKG without ischemic changes    Assessment & Plan:   Active Problems:   HTN (hypertension), malignant   Acute lower UTI   Atrial fibrillation, controlled ([place])   Acute metabolic encephalopathy   Hypoglycemia associated with diabetes ([place])   Sepsis ([place])     1. Acute Metabolic encephalopathy 1. Mentation has improved, back to baseline alert oriented x3 2. Multifactorial - UTI and hypoglycemic 3. CT head without acute abnormality 4. Rocephin  >> day 3, 5. Blood cultures>>> no growth to date 6. Urine culture >> no growth vibrating, repeat urine culture >> NGTD 7. Likely due to sepsis-resolved 8. We will continue to  monitor  2. Hypoglycemia - in the setting of DMII 1. Resolved 2. Hold home meds 3. 1 amp given in ED>>> monitor CBG closely 4. S/P D5 half-normal saline, discontinued 5/30 5. Blood sugars has improved, switching CBG to q. ACH S, Last CBG 141, 153, 127 this a.m.  3. UTI with possible sepsis 1. Resolved sepsis physiology 2. Met sepsis criteria on admission with heart HR 110, RR 22, WBC 12.3, altered mental status, elevated creatinine 2.13, hyperglycemia, (lactic acid normal 0.7, 0.8)--- source of infection UTI 3. Improving sepsis physiology 4. UA indicative of UTI 5. We will follow-up Blood and urine cultures >>> no growth to date 6. Per sepsis protocol, was treated with aggressive IV fluid resuscitation ... D/Ced  7. Continue Rocephin  4. Mod pro cal mal 1. Continue Ensure 2. Albumin 2.3 3. Continue Ensure  5. Paroxysmal atrial fib 1. Continue Eliquis and lopressor 2. Monitoring on telemetry  3. Stable,  6. HTN 1. Mildly hypotensive--- monitoring BP closely 2. Blood pressure remained soft but stable 3. Continue to withhold home medication Norvasc, continue Lopressor 4. Remained stable 7. HLD 1. Continue statin 2. Stable 8. DMII -hypoglycemic 1. Holding home  meds as above 2. Blood sugars has improved, discontinue D5 half-normal saline 3. -Checking blood sugar QA CHS, SSI coverage 4. Last CBG 117, 141, 153, 127 this morning   9.  Skin Assessment: I have examined the patient's skin and I agree with the wound assessment as performed by wound care team As outlined belowe: Pressure Injury 12/03/20 Perineum Posterior Stage 1 -  Intact skin with non-blanchable redness of a localized area usually over a bony prominence. (Active)  12/03/20 2210  Location: Perineum  Location Orientation: Posterior  Staging: Stage 1 -  Intact skin with non-blanchable redness of a localized area usually over a bony prominence.  Wound Description (Comments):   Present on Admission: Yes      Pressure Injury 12/04/20 Coccyx Right;Medial Stage 2 -  Partial thickness loss of dermis presenting as a shallow open injury with a red, pink wound bed without slough. 1cm x 0.5cm area to the right of previously healed pressure ulcer (Active)  12/04/20 0800  Location: Coccyx  Location Orientation: Right;Medial  Staging: Stage 2 -  Partial thickness loss of dermis presenting as a shallow open injury with a red, pink wound bed without slough.  Wound Description (Comments): 1cm x 0.5cm area to the right of previously healed pressure ulcer  Present on Admission: Yes     Pressure Injury 03/12/21 Sacrum Mid Stage 2 -  Partial thickness loss of dermis presenting as a shallow open injury with a red, pink wound bed without slough. (Active)  03/12/21 1128  Location: Sacrum  Location Orientation: Mid  Staging: Stage 2 -  Partial thickness loss of dermis presenting as a shallow open injury with a red, pink wound bed without slough.  Wound Description (Comments):   Present on Admission: Yes     Pressure Injury 03/12/21 Thigh Right;Upper Stage 2 -  Partial thickness loss of dermis presenting as a shallow open injury with a red, pink wound bed without slough. (Active)  03/12/21 1131  Location: Thigh  Location Orientation: Right;Upper  Staging: Stage 2 -  Partial thickness loss of dermis presenting as a shallow open injury with a red, pink wound bed without slough.  Wound Description (Comments):   Present on Admission: Yes     Pressure Injury 03/23/21 Pretibial Distal;Left Stage 3 -  Full thickness tissue loss. Subcutaneous fat may be visible but bone, tendon or muscle are NOT exposed. 7 cm X 4.5 cm no tunneling (Active)  03/23/21 1344  Location: Pretibial  Location Orientation: Distal;Left  Staging: Stage 3 -  Full thickness tissue loss. Subcutaneous fat may be visible but bone, tendon or muscle are NOT exposed.  Wound Description (Comments): 7 cm X 4.5 cm no tunneling  Present on Admission: Yes      Pressure Injury 03/23/21 Tibial Left;Posterior;Proximal Stage 3 -  Full thickness tissue loss. Subcutaneous fat may be visible but bone, tendon or muscle are NOT exposed. 1 cm X 1 Cm with yellow sloth present (Active)  03/23/21 1357  Location: Tibial  Location Orientation: Left;Posterior;Proximal  Staging: Stage 3 -  Full thickness tissue loss. Subcutaneous fat may be visible but bone, tendon or muscle are NOT exposed.  Wound Description (Comments): 1 cm X 1 Cm with yellow sloth present  Present on Admission: Yes     Pressure Injury 03/23/21 Thigh Anterior;Left Stage 2 -  Partial thickness loss of dermis presenting as a shallow open injury with a red, pink wound bed without slough. 2 cm X 0.5 cm pink wound bed (Active)  03/23/21 1358  Location: Thigh  Location Orientation: Anterior;Left  Staging: Stage 2 -  Partial thickness loss of dermis presenting as a shallow open injury with a red, pink wound bed without slough.  Wound Description (Comments): 2 cm X 0.5 cm pink wound bed  Present on Admission:      Pressure Injury 03/23/21 Thigh Anterior;Right Stage 2 -  Partial thickness loss of dermis presenting as a shallow open injury with a red, pink wound bed without slough. 2 cm x 1 cm pink wound bed (Active)  03/23/21 1400  Location: Thigh  Location Orientation: Anterior;Right  Staging: Stage 2 -  Partial thickness loss of dermis presenting as a shallow open injury with a red, pink wound bed without slough.  Wound Description (Comments): 2 cm x 1 cm pink wound bed  Present on Admission: Yes     Pressure Injury 03/23/21 Buttocks Right;Posterior Stage 2 -  Partial thickness loss of dermis presenting as a shallow open injury with a red, pink wound bed without slough. 0.5 cm x 0.5 cm pink wound bed (Active)  03/23/21 1402  Location: Buttocks  Location Orientation: Right;Posterior  Staging: Stage 2 -  Partial thickness loss of dermis presenting as a shallow open injury with a red, pink wound bed  without slough.  Wound Description (Comments): 0.5 cm x 0.5 cm pink wound bed  Present on Admission: Yes     Pressure Injury 03/23/21 Buttocks Right Stage 2 -  Partial thickness loss of dermis presenting as a shallow open injury with a red, pink wound bed without slough. 3 cm x 1 cm pink wound bed (Active)  03/23/21 1404  Location: Buttocks  Location Orientation: Right  Staging: Stage 2 -  Partial thickness loss of dermis presenting as a shallow open injury with a red, pink wound bed without slough.  Wound Description (Comments): 3 cm x 1 cm pink wound bed  Present on Admission: Yes   -------------------------------------------------------------------------------------------------------------------------------- Nutritional status:  The patient's BMI is: Body mass index is 37.91 kg/m. I agree with the assessment and plan as outlined   ---------------------------------------------------------------------------------------------------------------------------------------------------- Cultures; Blood Cultures x 2 >> NGT Urine Culture  >>> NGT    Antimicrobials: IV Rocephin   Consultants: None ---------------------------------------------------------------------------------------------------------------------------------------------  DVT prophylaxis:  SCD/Compression stockings and BT:DHRCBUL Code Status:   Code Status: Full Code  Family Communication:  Patient is now awake alert oriented x3, discussed with patient, daughter and son present at bedside updated The above findings and plan of care has been discussed with patient (daughter, son)  in detail,  they expressed understanding and agreement of above. -Advance care planning has been discussed.   Admission status:   Status is: Inpatient  Patient meets inpatient criteria with continued need of close monitoring for hypoglycemia, sepsis IV antibiotics  Dispo: The patient is from: SNF              Anticipated d/c is to: SNF  in 1 to 2 days              Patient currently is not medically stable to d/c.  That she continues to meet sepsis criteria with  altered mental status... Needing aggressive treatment IV fluids, IV antibiotics, close monitoring   Difficult to place patient No      Level of care: Med-Surg   Procedures:   No admission procedures for hospital encounter.     Antimicrobials:  Anti-infectives (From admission, onward)   Start     Dose/Rate Route Frequency Ordered Stop  03/23/21 1900  cefTRIAXone (ROCEPHIN) 1 g in sodium chloride 0.9 % 100 mL IVPB        1 g 200 mL/hr over 30 Minutes Intravenous Every 24 hours 03/23/21 0121     03/22/21 2130  cefTRIAXone (ROCEPHIN) 2 g in sodium chloride 0.9 % 100 mL IVPB        2 g 200 mL/hr over 30 Minutes Intravenous  Once 03/22/21 2125 03/22/21 2246       Medication:  . apixaban  5 mg Oral BID  . DULoxetine  60 mg Oral Daily  . feeding supplement  237 mL Oral BID BM  . gabapentin  300 mg Oral TID  . insulin aspart  0-15 Units Subcutaneous TID WC  . insulin aspart  0-5 Units Subcutaneous QHS  . levETIRAcetam  750 mg Oral BID  . metoprolol tartrate  25 mg Oral BID  . montelukast  10 mg Oral QHS  . oxybutynin  5 mg Oral QHS  . pantoprazole  40 mg Oral Daily  . rosuvastatin  10 mg Oral Daily  . traZODone  50 mg Oral QHS    acetaminophen **OR** acetaminophen, albuterol, LORazepam, ondansetron **OR** ondansetron (ZOFRAN) IV, oxyCODONE, oxyCODONE, umeclidinium bromide   Objective:   Vitals:   03/25/21 2305 03/26/21 0331 03/26/21 0550 03/26/21 0748  BP:   119/90 (P) 126/87  Pulse:   95 (P) 98  Resp:   18 (P) 18  Temp: 100.2 F (37.9 C)  98.6 F (37 C) (P) 97.8 F (36.6 C)  TempSrc: Oral  Oral (P) Axillary  SpO2:   (!) 80%   Weight:  91.6 kg    Height:        Intake/Output Summary (Last 24 hours) at 03/26/2021 1049 Last data filed at 03/26/2021 0900 Gross per 24 hour  Intake 240 ml  Output 600 ml  Net -360 ml   Filed Weights    03/22/21 1534 03/24/21 0500 03/26/21 0331  Weight: 91 kg 91.5 kg 91.6 kg     Examination:        Physical Exam:   General:  Alert, oriented, cooperative, no distress;   HEENT:  Normocephalic, PERRL, otherwise with in Normal limits   Neuro:  CNII-XII intact. , normal motor and sensation, reflexes intact   Lungs:   Clear to auscultation BL, Respirations unlabored, no wheezes / crackles  Cardio:    S1/S2, RRR, No murmure, No Rubs or Gallops   Abdomen:   Soft, non-tender, bowel sounds active all four quadrants,  no guarding or peritoneal signs.  Muscular skeletal:  Limited exam - in bed, able to move all 4 extremities, Normal strength,  2+ pulses,  symmetric, No pitting edema  Skin:  Dry, warm to touch, negative for any Rashes,  Wounds: Please see nursing documentation Pressure Injury 12/03/20 Perineum Posterior Stage 1 -  Intact skin with non-blanchable redness of a localized area usually over a bony prominence. (Active)  12/03/20 2210  Location: Perineum  Location Orientation: Posterior  Staging: Stage 1 -  Intact skin with non-blanchable redness of a localized area usually over a bony prominence.  Wound Description (Comments):   Present on Admission: Yes     Pressure Injury 12/04/20 Coccyx Right;Medial Stage 2 -  Partial thickness loss of dermis presenting as a shallow open injury with a red, pink wound bed without slough. 1cm x 0.5cm area to the right of previously healed pressure ulcer (Active)  12/04/20 0800  Location: Coccyx  Location Orientation: Right;Medial  Staging:  Stage 2 -  Partial thickness loss of dermis presenting as a shallow open injury with a red, pink wound bed without slough.  Wound Description (Comments): 1cm x 0.5cm area to the right of previously healed pressure ulcer  Present on Admission: Yes     Pressure Injury 03/12/21 Sacrum Mid Stage 2 -  Partial thickness loss of dermis presenting as a shallow open injury with a red, pink wound bed without slough.  (Active)  03/12/21 1128  Location: Sacrum  Location Orientation: Mid  Staging: Stage 2 -  Partial thickness loss of dermis presenting as a shallow open injury with a red, pink wound bed without slough.  Wound Description (Comments):   Present on Admission: Yes     Pressure Injury 03/12/21 Thigh Right;Upper Stage 2 -  Partial thickness loss of dermis presenting as a shallow open injury with a red, pink wound bed without slough. (Active)  03/12/21 1131  Location: Thigh  Location Orientation: Right;Upper  Staging: Stage 2 -  Partial thickness loss of dermis presenting as a shallow open injury with a red, pink wound bed without slough.  Wound Description (Comments):   Present on Admission: Yes     Pressure Injury 03/23/21 Pretibial Distal;Left Stage 3 -  Full thickness tissue loss. Subcutaneous fat may be visible but bone, tendon or muscle are NOT exposed. 7 cm X 4.5 cm no tunneling (Active)  03/23/21 1344  Location: Pretibial  Location Orientation: Distal;Left  Staging: Stage 3 -  Full thickness tissue loss. Subcutaneous fat may be visible but bone, tendon or muscle are NOT exposed.  Wound Description (Comments): 7 cm X 4.5 cm no tunneling  Present on Admission: Yes     Pressure Injury 03/23/21 Tibial Left;Posterior;Proximal Stage 3 -  Full thickness tissue loss. Subcutaneous fat may be visible but bone, tendon or muscle are NOT exposed. 1 cm X 1 Cm with yellow sloth present (Active)  03/23/21 1357  Location: Tibial  Location Orientation: Left;Posterior;Proximal  Staging: Stage 3 -  Full thickness tissue loss. Subcutaneous fat may be visible but bone, tendon or muscle are NOT exposed.  Wound Description (Comments): 1 cm X 1 Cm with yellow sloth present  Present on Admission: Yes     Pressure Injury 03/23/21 Thigh Anterior;Left Stage 2 -  Partial thickness loss of dermis presenting as a shallow open injury with a red, pink wound bed without slough. 2 cm X 0.5 cm pink wound bed (Active)   03/23/21 1358  Location: Thigh  Location Orientation: Anterior;Left  Staging: Stage 2 -  Partial thickness loss of dermis presenting as a shallow open injury with a red, pink wound bed without slough.  Wound Description (Comments): 2 cm X 0.5 cm pink wound bed  Present on Admission:      Pressure Injury 03/23/21 Thigh Anterior;Right Stage 2 -  Partial thickness loss of dermis presenting as a shallow open injury with a red, pink wound bed without slough. 2 cm x 1 cm pink wound bed (Active)  03/23/21 1400  Location: Thigh  Location Orientation: Anterior;Right  Staging: Stage 2 -  Partial thickness loss of dermis presenting as a shallow open injury with a red, pink wound bed without slough.  Wound Description (Comments): 2 cm x 1 cm pink wound bed  Present on Admission: Yes     Pressure Injury 03/23/21 Buttocks Right;Posterior Stage 2 -  Partial thickness loss of dermis presenting as a shallow open injury with a red, pink wound bed without slough. 0.5 cm  x 0.5 cm pink wound bed (Active)  03/23/21 1402  Location: Buttocks  Location Orientation: Right;Posterior  Staging: Stage 2 -  Partial thickness loss of dermis presenting as a shallow open injury with a red, pink wound bed without slough.  Wound Description (Comments): 0.5 cm x 0.5 cm pink wound bed  Present on Admission: Yes     Pressure Injury 03/23/21 Buttocks Right Stage 2 -  Partial thickness loss of dermis presenting as a shallow open injury with a red, pink wound bed without slough. 3 cm x 1 cm pink wound bed (Active)  03/23/21 1404  Location: Buttocks  Location Orientation: Right  Staging: Stage 2 -  Partial thickness loss of dermis presenting as a shallow open injury with a red, pink wound bed without slough.  Wound Description (Comments): 3 cm x 1 cm pink wound bed  Present on Admission: Yes                 ------------------------------------------------------------------------------------------------------------------------------------------    LABs:  CBC Latest Ref Rng & Units 03/23/2021 03/22/2021 03/14/2021  WBC 4.0 - 10.5 K/uL 11.8(H) 12.3(H) 6.4  Hemoglobin 12.0 - 15.0 g/dL 9.0(L) 11.7(L) 9.8(L)  Hematocrit 36.0 - 46.0 % 30.7(L) 40.9 33.0(L)  Platelets 150 - 400 K/uL 278 292 193   CMP Latest Ref Rng & Units 03/24/2021 03/23/2021 03/22/2021  Glucose 70 - 99 mg/dL 150(H) 167(H) 59(L)  BUN 6 - 20 mg/dL 31(H) 34(H) 35(H)  Creatinine 0.44 - 1.00 mg/dL 2.07(H) 2.13(H) 2.06(H)  Sodium 135 - 145 mmol/L 138 142 140  Potassium 3.5 - 5.1 mmol/L 4.7 4.7 5.3(H)  Chloride 98 - 111 mmol/L 106 107 105  CO2 22 - 32 mmol/L '27 27 29  ' Calcium 8.9 - 10.3 mg/dL 8.5(L) 8.4(L) 9.3  Total Protein 6.5 - 8.1 g/dL - 5.9(L) 7.5  Total Bilirubin 0.3 - 1.2 mg/dL - 0.4 0.7  Alkaline Phos 38 - 126 U/L - 55 72  AST 15 - 41 U/L - 11(L) 13(L)  ALT 0 - 44 U/L - 9 11       Micro Results Recent Results (from the past 240 hour(s))  Urine Culture     Status: None   Collection Time: 03/22/21  8:55 PM   Specimen: Urine, Catheterized  Result Value Ref Range Status   Specimen Description   Final    URINE, CATHETERIZED Performed at [facility], [address], [place], [place] [address]    Special Requests   Final    NONE Performed at [facility], [address], [place], [place] [address]    Culture   Final    NO GROWTH Performed at [facility], [place] [address] [place], [place] [address]    Report Status 03/25/2021 FINAL  Final  Resp Panel by RT-PCR (Flu A&B, Covid) Nasopharyngeal Swab     Status: None   Collection Time: 03/22/21 10:08 PM   Specimen: Nasopharyngeal Swab; Nasopharyngeal(NP) swabs in vial transport medium  Result Value Ref Range Status   SARS Coronavirus 2 by RT PCR NEGATIVE NEGATIVE Final    Comment: (NOTE) SARS-CoV-2 target nucleic acids are NOT DETECTED.  The SARS-CoV-2 RNA is  generally detectable in upper respiratory specimens during the acute phase of infection. The lowest concentration of SARS-CoV-2 viral copies this assay can detect is 138 copies/mL. A negative result does not preclude SARS-Cov-2 infection and should not be used as the sole basis for treatment or other patient management decisions. A negative result may occur with  improper  specimen collection/handling, submission of specimen other than nasopharyngeal swab, presence of viral mutation(s) within the areas targeted by this assay, and inadequate number of viral copies(<138 copies/mL). A negative result must be combined with clinical observations, patient history, and epidemiological information. The expected result is Negative.  Fact Sheet for Patients:  EntrepreneurPulse.com.au  Fact Sheet for Healthcare Providers:  IncredibleEmployment.be  This test is no t yet approved or cleared by the [place] FDA and  has been authorized for detection and/or diagnosis of SARS-CoV-2 by FDA under an Emergency Use Authorization (EUA). This EUA will remain  in effect (meaning this test can be used) for the duration of the COVID-19 declaration under Section 564(b)(1) of the Act, 21 U.S.C.section 360bbb-3(b)(1), unless the authorization is terminated  or revoked sooner.       Influenza A by PCR NEGATIVE NEGATIVE Final   Influenza B by PCR NEGATIVE NEGATIVE Final    Comment: (NOTE) The Xpert Xpress SARS-CoV-2/FLU/RSV plus assay is intended as an aid in the diagnosis of influenza from Nasopharyngeal swab specimens and should not be used as a sole basis for treatment. Nasal washings and aspirates are unacceptable for Xpert Xpress SARS-CoV-2/FLU/RSV testing.  Fact Sheet for Patients: EntrepreneurPulse.com.au  Fact Sheet for Healthcare Providers: IncredibleEmployment.be  This test is not yet approved or cleared by the [place] FDA and has been authorized for detection and/or diagnosis of SARS-CoV-2 by FDA under an Emergency Use Authorization (EUA). This EUA will remain in effect (meaning this test can be used) for the duration of the COVID-19 declaration under Section 564(b)(1) of the Act, 21 U.S.C. section 360bbb-3(b)(1), unless the authorization is terminated or revoked.  Performed at [facility], [address], [place], [place] [address]   Blood Culture (routine x 2)     Status: None (Preliminary result)   Collection Time: 03/22/21 10:39 PM   Specimen: BLOOD RIGHT ARM  Result Value Ref Range Status   Specimen Description BLOOD RIGHT ARM  Final   Special Requests   Final    BOTTLES DRAWN AEROBIC AND ANAEROBIC Blood Culture adequate volume   Culture   Final    NO GROWTH 4 DAYS Performed at [facility], [address] [place], [place] [address]    Report Status PENDING  Incomplete  Blood Culture (routine x 2)     Status: None (Preliminary result)   Collection Time: 03/22/21 10:45 PM   Specimen: BLOOD LEFT ARM  Result Value Ref Range Status   Specimen Description BLOOD LEFT ARM  Final   Special Requests   Final    BOTTLES DRAWN AEROBIC AND ANAEROBIC Blood Culture adequate volume   Culture   Final    NO GROWTH 4 DAYS Performed at [facility], [address] [place], [place] [address]    Report Status PENDING  Incomplete  MRSA PCR Screening     Status: None   Collection Time: 03/23/21  2:40 AM   Specimen: Nasal Mucosa; Nasopharyngeal  Result Value Ref Range Status   MRSA by PCR NEGATIVE NEGATIVE Final    Comment:        The GeneXpert MRSA Assay (FDA approved for NASAL specimens only), is one component of a comprehensive MRSA colonization surveillance program. It is not intended to diagnose MRSA infection nor to guide or monitor treatment for MRSA infections. Performed at [facility], [address], [place], [place] [address]     Radiology Reports CT HEAD WO CONTRAST  Result  Date: 03/22/2021 CLINICAL DATA:  Altered mental  status EXAM: CT HEAD WITHOUT CONTRAST TECHNIQUE: Contiguous axial images were obtained from the base of the skull through the vertex without intravenous contrast. COMPARISON:  Mar 11, 2021, August 15, 2020 FINDINGS: Brain: No evidence of acute infarction, hemorrhage, hydrocephalus, extra-axial collection or mass lesion/mass effect. Periventricular white matter hypodensities consistent with sequela of chronic microvascular ischemic disease. Remote lacunar infarction of the LEFT thalamus and caudate. Vascular: Vascular calcifications of the carotid siphons. Skull: No acute fracture.  Remote LEFT medial orbital wall fracture. Sinuses/Orbits: No acute finding. Other: None. IMPRESSION: No acute intracranial abnormality. Electronically Signed   By: Valentino Saxon MD   On: 03/22/2021 16:20   CT Head Wo Contrast  Result Date: 03/11/2021 CLINICAL DATA:  Delirium altered EXAM: CT HEAD WITHOUT CONTRAST TECHNIQUE: Contiguous axial images were obtained from the base of the skull through the vertex without intravenous contrast. COMPARISON:  CT 08/13/2020, MRI 08/15/2020 FINDINGS: Brain: No acute territorial infarction, hemorrhage, or intracranial mass. Mild atrophy. Chronic lacunar infarct within the left thalamus and left caudate. Moderate patchy white matter hypodensity consistent with chronic small vessel ischemic change. Stable ventricle size. Vascular: No hyperdense vessels.  Carotid vascular calcification Skull: Normal. Negative for fracture or focal lesion. Sinuses/Orbits: Old left medial orbital wall fracture. Mild mucosal thickening in the sinuses. Other: None IMPRESSION: 1. No CT evidence for acute intracranial abnormality. 2. Atrophy and chronic small vessel ischemic changes. Electronically Signed   By: Donavan Foil M.D.   On: 03/11/2021 17:32   DG Chest Port 1 View  Result Date: 03/09/2021 CLINICAL DATA:  Tachycardia EXAM: PORTABLE CHEST 1 VIEW COMPARISON:   December 03, 2020 FINDINGS: The lungs are clear. There is cardiomegaly with pulmonary vascularity normal. No adenopathy. No bone lesions. IMPRESSION: Cardiomegaly.  No edema or airspace opacity. Electronically Signed   By: Lowella Grip III M.D.   On: 03/09/2021 17:39    SIGNED: Deatra [name], MD, FHM. [facility],  Pager (please use [email or web address] to page/text) Please use Epic Secure Chat for non-urgent communication (7AM-7PM)  If 7PM-7AM, please contact night-coverage [email or web address], 03/26/2021, 10:49 AM

## 2021-03-26 NOTE — Progress Notes (Signed)
Pt's son called to check in on pt. This RN let son talk with pt. After the phone call, pt asked for a candy bar. This RN stated that there was not any candy bars available. This RN then asked pt if she was going allow this RN to place an IV in, as pt has IV ABX due. Pt stated "NO. I've already told y'all that" MD was made aware at start of shift that pt continues to lack/refuse IV access.  Pt's leg/ankle wounds are weeping slightly through the gauze on the under-side. This RN attempted to elevate pt's leg on pillow. Pt screamed at this RN as soon as this RN touched her leg. Pt stated she would move her leg herself, however, pt would not lift leg high enough to place pillow under. This RN will attempt to elevate legs on next round into room, as well as doing a dressing change.

## 2021-03-27 DIAGNOSIS — N390 Urinary tract infection, site not specified: Secondary | ICD-10-CM | POA: Diagnosis not present

## 2021-03-27 DIAGNOSIS — I4891 Unspecified atrial fibrillation: Secondary | ICD-10-CM | POA: Diagnosis not present

## 2021-03-27 DIAGNOSIS — G9341 Metabolic encephalopathy: Secondary | ICD-10-CM | POA: Diagnosis not present

## 2021-03-27 DIAGNOSIS — I10 Essential (primary) hypertension: Secondary | ICD-10-CM | POA: Diagnosis not present

## 2021-03-27 LAB — CBC
HCT: 32 % — ABNORMAL LOW (ref 36.0–46.0)
Hemoglobin: 9.2 g/dL — ABNORMAL LOW (ref 12.0–15.0)
MCH: 28.1 pg (ref 26.0–34.0)
MCHC: 28.8 g/dL — ABNORMAL LOW (ref 30.0–36.0)
MCV: 97.9 fL (ref 80.0–100.0)
Platelets: 470 10*3/uL — ABNORMAL HIGH (ref 150–400)
RBC: 3.27 MIL/uL — ABNORMAL LOW (ref 3.87–5.11)
RDW: 15.1 % (ref 11.5–15.5)
WBC: 9.8 10*3/uL (ref 4.0–10.5)
nRBC: 0.4 % — ABNORMAL HIGH (ref 0.0–0.2)

## 2021-03-27 LAB — CULTURE, BLOOD (ROUTINE X 2)
Culture: NO GROWTH
Culture: NO GROWTH
Special Requests: ADEQUATE
Special Requests: ADEQUATE

## 2021-03-27 LAB — BASIC METABOLIC PANEL
Anion gap: 13 (ref 5–15)
BUN: 27 mg/dL — ABNORMAL HIGH (ref 6–20)
CO2: 21 mmol/L — ABNORMAL LOW (ref 22–32)
Calcium: 8.8 mg/dL — ABNORMAL LOW (ref 8.9–10.3)
Chloride: 106 mmol/L (ref 98–111)
Creatinine, Ser: 2.04 mg/dL — ABNORMAL HIGH (ref 0.44–1.00)
GFR, Estimated: 28 mL/min — ABNORMAL LOW (ref >60)
Glucose, Bld: 143 mg/dL — ABNORMAL HIGH (ref 70–99)
Potassium: 4.5 mmol/L (ref 3.5–5.1)
Sodium: 140 mmol/L (ref 135–145)

## 2021-03-27 LAB — URINE CULTURE

## 2021-03-27 LAB — GLUCOSE, CAPILLARY
Glucose-Capillary: 155 mg/dL — ABNORMAL HIGH (ref 70–99)
Glucose-Capillary: 156 mg/dL — ABNORMAL HIGH (ref 70–99)

## 2021-03-27 MED ORDER — INSULIN ASPART 100 UNIT/ML IJ SOLN: 0.0000 [IU] | Freq: Three times a day (TID) | INTRAMUSCULAR | 11 refills | Status: DC

## 2021-03-27 MED ORDER — ELIQUIS 5 MG PO TABS: 5.0000 mg | ORAL_TABLET | Freq: Two times a day (BID) | ORAL | 3 refills | Status: DC

## 2021-03-27 MED ORDER — ZINC OXIDE 40 % EX PSTE
PASTE | CUTANEOUS | Status: DC | PRN
  Filled 2021-03-27: qty 57

## 2021-03-27 MED ORDER — FUROSEMIDE 40 MG PO TABS: 20.0000 mg | ORAL_TABLET | ORAL | 6 refills | Status: DC

## 2021-03-27 MED ORDER — SILVER SULFADIAZINE 1 % EX CREA
TOPICAL_CREAM | Freq: Two times a day (BID) | CUTANEOUS | Status: DC
  Filled 2021-03-27: qty 85

## 2021-03-27 MED ORDER — METOPROLOL TARTRATE 25 MG PO TABS
25.0000 mg | ORAL_TABLET | Freq: Two times a day (BID) | ORAL | Status: DC
Start: 2021-03-27 — End: 2021-04-04
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
## 2021-03-27 NOTE — Care Management Important Message (Signed)
Important Message  Patient Details  Name: [name] MRN: [id number] Date of Birth: 1966-02-14   Medicare Important Message Given:  Yes     Tommy Medal 03/27/2021, 10:41 AM
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
## 2021-03-27 NOTE — Discharge Summary (Signed)
Physician Discharge Summary [facility]    Patient: [name]                   Admit date: 03/22/2021   DOB: [birth year]             Discharge date:03/27/2021/10:05 AM IZT:[id number]                          PCP: Caprice Renshaw, MD  Disposition: SNF   Recommendations for Outpatient Follow-up:   . Follow up: PCP in 1 week. Marland Kitchen Specific instruction for wound care, wound care consultation ASAP . Patient instructed to be moved every 2 hours in bed to avoid further skin breakdown and pressure ulcers . Monitor blood sugars closely, patient was hypoglycemic upon this admission, Lantus was discontinued .   Discharge Condition: Stable   Code Status:   Code Status: Full Code  Diet recommendation: Diabetic diet   Discharge Diagnoses:    Active Problems:   HTN (hypertension), malignant   Acute lower UTI   Atrial fibrillation, controlled ([place])   Acute metabolic encephalopathy   Hypoglycemia associated with diabetes ([place])   Sepsis ([place])   History of Present Illness/ Hospital Course Kathleen Argue Summary:     BeverlyKayloris a15 y.o.f with h/o tobacco abuse, P-a.fib, CM, Sz,  HTN, HLD,,CHF, DMI II hypertension, COPD, CKD IIIa ... From nursing home facility, presented to ED with altered mental status.   Found hypoxic, placed on 3 L of oxygen, she was found hypoglycemic with CBG of 59,44,37 She was given an amp of D50 and started on dextrose drip. She is also found to have a UTI.  Patient was started on Rocephin. Her last culture grew staph aureus that was sensitive to Cipro.   In the ED Patient was afebrile with a temp of 98.4, heart rate remains slightly tachycardic from 103-110, respiratory rate 14-22, blood pressure 106/65 satting 100% on 2 L nasal cannula Leukocytosis 12.3, hemoglobin 11.7 Creatinine at baseline 2.06 Respiratory panel negative UA indicative of UTI EKG without ischemic changes    Assessment & Plan:   Active Problems:   HTN (hypertension),  malignant   Acute lower UTI   Atrial fibrillation, controlled ([place])   Acute metabolic encephalopathy   Hypoglycemia associated with diabetes ([name])   Sepsis ([place])     1. Acute Metabolic encephalopathy 1. Mentation has improved, back to baseline alert oriented x3 2. Multifactorial - UTI and hypoglycemic 3. CT head without acute abnormality 4. Completed course of IV antibiotic with Rocephin 5. Blood cultures>>> no growth to date 6. Urine culture >> no growth vibrating, repeat urine culture >> NGTD 7. Likely due to sepsis-resolved 8.   2. Hypoglycemia - in the setting of DMII 1. Resolved 2. Hold home meds 3. 1 amp given in ED>>> monitor CBG closely 4. S/P D5 half-normal saline, discontinued 5/30 5. Home medication of Lantus was discontinued, patient is to resume Trulicity 6. Continue to monitor blood sugar closely to avoid further hypoglycemia  7. Blood sugars has improved, switching CBG to q. ACH S, Last CBG 141, 153, 127 t  3. UTI with possible sepsis 1. Resolved sepsis physiology 2. Met sepsis criteria on admission with heart HR 110, RR 22, WBC 12.3, altered mental status, elevated creatinine 2.13, hyperglycemia, (lactic acid normal 0.7, 0.8)--- source of infection UTI 3. Improving sepsis physiology 4. UA indicative of UTI 5. We will follow-up Blood and urine cultures >>> no growth to date 6.  Per sepsis protocol, was treated with aggressive IV fluid resuscitation ... D/Ced  7. Continue Rocephin  4. Mod pro cal mal 1. Continue Ensure 2. Albumin 2.3 3. Continue Ensure  5. Paroxysmal atrial fib 1. Continue Eliquis and lopressor 2. Monitoring on telemetry  3. Stable,  6. HTN 1. Mildly hypotensive--- monitoring BP closely 2. Blood pressure remained soft but stable 3. Continue to withhold home medication Norvasc, continue Lopressor 4. Remained stable 7. HLD 1. Continue statin 2. Stable 8. DMII -hypoglycemic 1. Improved 2. Blood sugars has improved,  discontinue D5 half-normal saline 3. -Checking blood sugar QA CHS, SSI coverage 4. Last CBG 117, 141, 153, 127 this morning 5. Discontinue home medication of Lantus, resume Trulicity, continue SSI   9.  Skin Assessment: I have examined the patient's skin and I agree with the wound assessment as performed by wound care team As outlined belowe:    SKIN assessment: I agree with skin assessment and plan as outlined below: Pressure Injury 12/03/20 Perineum Posterior Stage 1 -  Intact skin with non-blanchable redness of a localized area usually over a bony prominence. (Active)  12/03/20 2210  Location: Perineum  Location Orientation: Posterior  Staging: Stage 1 -  Intact skin with non-blanchable redness of a localized area usually over a bony prominence.  Wound Description (Comments):   Present on Admission: Yes     Pressure Injury 12/04/20 Coccyx Right;Medial Stage 2 -  Partial thickness loss of dermis presenting as a shallow open injury with a red, pink wound bed without slough. 1cm x 0.5cm area to the right of previously healed pressure ulcer (Active)  12/04/20 0800  Location: Coccyx  Location Orientation: Right;Medial  Staging: Stage 2 -  Partial thickness loss of dermis presenting as a shallow open injury with a red, pink wound bed without slough.  Wound Description (Comments): 1cm x 0.5cm area to the right of previously healed pressure ulcer  Present on Admission: Yes     Pressure Injury 03/12/21 Sacrum Mid Stage 2 -  Partial thickness loss of dermis presenting as a shallow open injury with a red, pink wound bed without slough. (Active)  03/12/21 1128  Location: Sacrum  Location Orientation: Mid  Staging: Stage 2 -  Partial thickness loss of dermis presenting as a shallow open injury with a red, pink wound bed without slough.  Wound Description (Comments):   Present on Admission: Yes     Pressure Injury 03/12/21 Thigh Right;Upper Stage 2 -  Partial thickness loss of dermis  presenting as a shallow open injury with a red, pink wound bed without slough. (Active)  03/12/21 1131  Location: Thigh  Location Orientation: Right;Upper  Staging: Stage 2 -  Partial thickness loss of dermis presenting as a shallow open injury with a red, pink wound bed without slough.  Wound Description (Comments):   Present on Admission: Yes     Pressure Injury 03/23/21 Pretibial Distal;Left Stage 3 -  Full thickness tissue loss. Subcutaneous fat may be visible but bone, tendon or muscle are NOT exposed. 7 cm X 4.5 cm no tunneling (Active)  03/23/21 1344  Location: Pretibial  Location Orientation: Distal;Left  Staging: Stage 3 -  Full thickness tissue loss. Subcutaneous fat may be visible but bone, tendon or muscle are NOT exposed.  Wound Description (Comments): 7 cm X 4.5 cm no tunneling  Present on Admission: Yes     Pressure Injury 03/23/21 Tibial Left;Posterior;Proximal Stage 3 -  Full thickness tissue loss. Subcutaneous fat may be visible but  bone, tendon or muscle are NOT exposed. 1 cm X 1 Cm with yellow sloth present (Active)  03/23/21 1357  Location: Tibial  Location Orientation: Left;Posterior;Proximal  Staging: Stage 3 -  Full thickness tissue loss. Subcutaneous fat may be visible but bone, tendon or muscle are NOT exposed.  Wound Description (Comments): 1 cm X 1 Cm with yellow sloth present  Present on Admission: Yes     Pressure Injury 03/23/21 Thigh Anterior;Left Stage 2 -  Partial thickness loss of dermis presenting as a shallow open injury with a red, pink wound bed without slough. 2 cm X 0.5 cm pink wound bed (Active)  03/23/21 1358  Location: Thigh  Location Orientation: Anterior;Left  Staging: Stage 2 -  Partial thickness loss of dermis presenting as a shallow open injury with a red, pink wound bed without slough.  Wound Description (Comments): 2 cm X 0.5 cm pink wound bed  Present on Admission:      Pressure Injury 03/23/21 Thigh Anterior;Right Stage 2 -  Partial  thickness loss of dermis presenting as a shallow open injury with a red, pink wound bed without slough. 2 cm x 1 cm pink wound bed (Active)  03/23/21 1400  Location: Thigh  Location Orientation: Anterior;Right  Staging: Stage 2 -  Partial thickness loss of dermis presenting as a shallow open injury with a red, pink wound bed without slough.  Wound Description (Comments): 2 cm x 1 cm pink wound bed  Present on Admission: Yes     Pressure Injury 03/23/21 Buttocks Right;Posterior Stage 2 -  Partial thickness loss of dermis presenting as a shallow open injury with a red, pink wound bed without slough. 0.5 cm x 0.5 cm pink wound bed (Active)  03/23/21 1402  Location: Buttocks  Location Orientation: Right;Posterior  Staging: Stage 2 -  Partial thickness loss of dermis presenting as a shallow open injury with a red, pink wound bed without slough.  Wound Description (Comments): 0.5 cm x 0.5 cm pink wound bed  Present on Admission: Yes     Pressure Injury 03/23/21 Buttocks Right Stage 2 -  Partial thickness loss of dermis presenting as a shallow open injury with a red, pink wound bed without slough. 3 cm x 1 cm pink wound bed (Active)  03/23/21 1404  Location: Buttocks  Location Orientation: Right  Staging: Stage 2 -  Partial thickness loss of dermis presenting as a shallow open injury with a red, pink wound bed without slough.  Wound Description (Comments): 3 cm x 1 cm pink wound bed  Present on Admission: Yes    -------------------------------------------------------------------------------------------------------------------------------- Nutritional status:  The patient's BMI is: Body mass index is 37.91 kg/m. I agree with the assessment and plan as outlined   -------------------------------------------------------------------------------------------------------------------------------------------- Cultures; Blood Cultures x 2 >> NGT Urine Culture  >>> NGT    Antimicrobials: IV  Rocephin   Consultants: None -------------------------------------------------------------------------------------------------------------------------------------------- Code Status:   Code Status: Full Code  Family Communication:  Patient is now awake alert oriented x3, discussed with patient, daughter and son present at bedside updated The above findings and plan of care has been discussed with patient (daughter, son)  in detail,  they expressed understanding and agreement of above. -Advance care planning has been discussed.     Dispo: The patient is from: SNF  Anticipated d/c is to: SNF       Discharge Instructions:   Discharge Instructions    Activity as tolerated - No restrictions   Complete by: As directed  Call MD for:  difficulty breathing, headache or visual disturbances   Complete by: As directed    Call MD for:  persistant dizziness or light-headedness   Complete by: As directed    Call MD for:  redness, tenderness, or signs of infection (pain, swelling, redness, odor or green/yellow discharge around incision site)   Complete by: As directed    Call MD for:  temperature >100.4   Complete by: As directed    Diet - low sodium heart healthy   Complete by: As directed    Discharge instructions   Complete by: As directed    Check CBG before every meal   Discharge wound care:   Complete by: As directed    Per RN wound care ... continue wound care ... needs wound care consult ASAP   Increase activity slowly   Complete by: As directed        Medication List    STOP taking these medications   cephALEXin 500 MG capsule Commonly known as: KEFLEX   gabapentin 300 MG capsule Commonly known as: NEURONTIN   insulin glargine 100 UNIT/ML injection Commonly known as: LANTUS   LORazepam 0.5 MG tablet Commonly known as: ATIVAN   montelukast 10 MG tablet Commonly known as: SINGULAIR   oxyCODONE 5 MG immediate release tablet Commonly known  as: Oxy IR/ROXICODONE     TAKE these medications   acetaminophen 325 MG tablet Commonly known as: TYLENOL Take 2 tablets (650 mg total) by mouth every 6 (six) hours as needed for headache, fever or mild pain.   albuterol 108 (90 Base) MCG/ACT inhaler Commonly known as: ProAir HFA INHALE 2 PUFFS EVERY 6 HOURS AS NEEDED FOR SHORTNESS OF BREATH/WHEEZING. What changed:   how much to take  how to take this  when to take this  reasons to take this  additional instructions   amLODipine 10 MG tablet Commonly known as: NORVASC Take 1 tablet (10 mg total) by mouth daily.   DULoxetine 60 MG capsule Commonly known as: CYMBALTA Take 1 capsule (60 mg total) by mouth daily.   Eliquis 5 MG Tabs tablet Generic drug: apixaban Take 1 tablet (5 mg total) by mouth 2 (two) times daily. Okay to restart Eliquis on 11/17/2020   furosemide 40 MG tablet Commonly known as: Lasix Take 0.5 tablets (20 mg total) by mouth every Tuesday, Thursday, Saturday, and Sunday.   insulin aspart 100 UNIT/ML injection Commonly known as: novoLOG Inject 0-15 Units into the skin 3 (three) times daily with meals.   levETIRAcetam 750 MG tablet Commonly known as: KEPPRA Take 1 tablet (750 mg total) by mouth 2 (two) times daily.   linagliptin 5 MG Tabs tablet Commonly known as: TRADJENTA Take 5 mg by mouth daily.   metoprolol tartrate 25 MG tablet Commonly known as: LOPRESSOR Take 1 tablet (25 mg total) by mouth 2 (two) times daily.   omeprazole 20 MG capsule Commonly known as: PRILOSEC Take 20 mg by mouth daily.   oxybutynin 5 MG 24 hr tablet Commonly known as: DITROPAN-XL Take 5 mg by mouth at bedtime.   rosuvastatin 10 MG tablet Commonly known as: CRESTOR Take 1 tablet (10 mg total) by mouth daily.   Spiriva HandiHaler 18 MCG inhalation capsule Generic drug: tiotropium Place 18 mcg into inhaler and inhale daily as needed (Shortness of breath).   traZODone 50 MG tablet Commonly known as:  DESYREL Take 1 tablet (50 mg total) by mouth at bedtime.   Trulicity 3 ZO/1.0RU Sopn Generic  drug: Dulaglutide Inject 3 mg into the skin See admin instructions. Inject 0.5 mls subcutaneously one time a day every Friday   vitamin C 500 MG tablet Commonly known as: ASCORBIC ACID Take 500 mg by mouth daily.   Zinc Oxide 10 % Aero Apply 1 application topically daily.   zinc sulfate 220 (50 Zn) MG capsule Take 1 capsule (220 mg total) by mouth daily.       Contact information for after-discharge care    [facility] .   Service: Skilled Nursing Contact information: [address] [place] [phone]                 No Known Allergies   Procedures /Studies:   CT HEAD WO CONTRAST  Result Date: 03/22/2021 CLINICAL DATA:  Altered mental status EXAM: CT HEAD WITHOUT CONTRAST TECHNIQUE: Contiguous axial images were obtained from the base of the skull through the vertex without intravenous contrast. COMPARISON:  Mar 11, 2021, August 15, 2020 FINDINGS: Brain: No evidence of acute infarction, hemorrhage, hydrocephalus, extra-axial collection or mass lesion/mass effect. Periventricular white matter hypodensities consistent with sequela of chronic microvascular ischemic disease. Remote lacunar infarction of the LEFT thalamus and caudate. Vascular: Vascular calcifications of the carotid siphons. Skull: No acute fracture.  Remote LEFT medial orbital wall fracture. Sinuses/Orbits: No acute finding. Other: None. IMPRESSION: No acute intracranial abnormality. Electronically Signed   By: Valentino Saxon MD   On: 03/22/2021 16:20   CT Head Wo Contrast  Result Date: 03/11/2021 CLINICAL DATA:  Delirium altered EXAM: CT HEAD WITHOUT CONTRAST TECHNIQUE: Contiguous axial images were obtained from the base of the skull through the vertex without intravenous contrast. COMPARISON:  CT 08/13/2020, MRI 08/15/2020 FINDINGS:  Brain: No acute territorial infarction, hemorrhage, or intracranial mass. Mild atrophy. Chronic lacunar infarct within the left thalamus and left caudate. Moderate patchy white matter hypodensity consistent with chronic small vessel ischemic change. Stable ventricle size. Vascular: No hyperdense vessels.  Carotid vascular calcification Skull: Normal. Negative for fracture or focal lesion. Sinuses/Orbits: Old left medial orbital wall fracture. Mild mucosal thickening in the sinuses. Other: None IMPRESSION: 1. No CT evidence for acute intracranial abnormality. 2. Atrophy and chronic small vessel ischemic changes. Electronically Signed   By: Donavan Foil M.D.   On: 03/11/2021 17:32   DG Chest Port 1 View  Result Date: 03/09/2021 CLINICAL DATA:  Tachycardia EXAM: PORTABLE CHEST 1 VIEW COMPARISON:  December 03, 2020 FINDINGS: The lungs are clear. There is cardiomegaly with pulmonary vascularity normal. No adenopathy. No bone lesions. IMPRESSION: Cardiomegaly.  No edema or airspace opacity. Electronically Signed   By: Lowella Grip III M.D.   On: 03/09/2021 17:39     Subjective:   Patient was seen and examined 03/27/2021, 10:05 AM Patient stable today. No acute distress.  No issues overnight Stable for discharge.  Discharge Exam:    Vitals:   03/26/21 1319 03/26/21 2225 03/27/21 0500 03/27/21 0633  BP: 111/90 119/87  111/76  Pulse: (!) 101 (!) 104  (!) 107  Resp: '18 19  18  ' Temp: 98.3 F (36.8 C) 98 F (36.7 C)  98.3 F (36.8 C)  TempSrc: Oral     SpO2:  91%  (!) 89%  Weight:   91.7 kg   Height:        General: Pt lying comfortably in bed & appears in no obvious distress. Cardiovascular: S1 & S2 heard, RRR, S1/S2 +. No murmurs, rubs, gallops  or clicks. No JVD or pedal edema. Respiratory: Clear to auscultation without wheezing, rhonchi or crackles. No increased work of breathing. Abdominal:  Non-distended, non-tender & soft. No organomegaly or masses appreciated. Normal bowel sounds  heard. CNS: Alert and oriented. No focal deficits. Extremities: no edema, no cyanosis Pressure Injury 12/03/20 Perineum Posterior Stage 1 -  Intact skin with non-blanchable redness of a localized area usually over a bony prominence. (Active)  12/03/20 2210  Location: Perineum  Location Orientation: Posterior  Staging: Stage 1 -  Intact skin with non-blanchable redness of a localized area usually over a bony prominence.  Wound Description (Comments):   Present on Admission: Yes     Pressure Injury 12/04/20 Coccyx Right;Medial Stage 2 -  Partial thickness loss of dermis presenting as a shallow open injury with a red, pink wound bed without slough. 1cm x 0.5cm area to the right of previously healed pressure ulcer (Active)  12/04/20 0800  Location: Coccyx  Location Orientation: Right;Medial  Staging: Stage 2 -  Partial thickness loss of dermis presenting as a shallow open injury with a red, pink wound bed without slough.  Wound Description (Comments): 1cm x 0.5cm area to the right of previously healed pressure ulcer  Present on Admission: Yes     Pressure Injury 03/12/21 Sacrum Mid Stage 2 -  Partial thickness loss of dermis presenting as a shallow open injury with a red, pink wound bed without slough. (Active)  03/12/21 1128  Location: Sacrum  Location Orientation: Mid  Staging: Stage 2 -  Partial thickness loss of dermis presenting as a shallow open injury with a red, pink wound bed without slough.  Wound Description (Comments):   Present on Admission: Yes     Pressure Injury 03/12/21 Thigh Right;Upper Stage 2 -  Partial thickness loss of dermis presenting as a shallow open injury with a red, pink wound bed without slough. (Active)  03/12/21 1131  Location: Thigh  Location Orientation: Right;Upper  Staging: Stage 2 -  Partial thickness loss of dermis presenting as a shallow open injury with a red, pink wound bed without slough.  Wound Description (Comments):   Present on Admission: Yes      Pressure Injury 03/23/21 Pretibial Distal;Left Stage 3 -  Full thickness tissue loss. Subcutaneous fat may be visible but bone, tendon or muscle are NOT exposed. 7 cm X 4.5 cm no tunneling (Active)  03/23/21 1344  Location: Pretibial  Location Orientation: Distal;Left  Staging: Stage 3 -  Full thickness tissue loss. Subcutaneous fat may be visible but bone, tendon or muscle are NOT exposed.  Wound Description (Comments): 7 cm X 4.5 cm no tunneling  Present on Admission: Yes     Pressure Injury 03/23/21 Tibial Left;Posterior;Proximal Stage 3 -  Full thickness tissue loss. Subcutaneous fat may be visible but bone, tendon or muscle are NOT exposed. 1 cm X 1 Cm with yellow sloth present (Active)  03/23/21 1357  Location: Tibial  Location Orientation: Left;Posterior;Proximal  Staging: Stage 3 -  Full thickness tissue loss. Subcutaneous fat may be visible but bone, tendon or muscle are NOT exposed.  Wound Description (Comments): 1 cm X 1 Cm with yellow sloth present  Present on Admission: Yes     Pressure Injury 03/23/21 Thigh Anterior;Left Stage 2 -  Partial thickness loss of dermis presenting as a shallow open injury with a red, pink wound bed without slough. 2 cm X 0.5 cm pink wound bed (Active)  03/23/21 1358  Location: Thigh  Location Orientation: Anterior;Left  Staging: Stage 2 -  Partial thickness loss of dermis presenting as a shallow open injury with a red, pink wound bed without slough.  Wound Description (Comments): 2 cm X 0.5 cm pink wound bed  Present on Admission:      Pressure Injury 03/23/21 Thigh Anterior;Right Stage 2 -  Partial thickness loss of dermis presenting as a shallow open injury with a red, pink wound bed without slough. 2 cm x 1 cm pink wound bed (Active)  03/23/21 1400  Location: Thigh  Location Orientation: Anterior;Right  Staging: Stage 2 -  Partial thickness loss of dermis presenting as a shallow open injury with a red, pink wound bed without slough.   Wound Description (Comments): 2 cm x 1 cm pink wound bed  Present on Admission: Yes     Pressure Injury 03/23/21 Buttocks Right;Posterior Stage 2 -  Partial thickness loss of dermis presenting as a shallow open injury with a red, pink wound bed without slough. 0.5 cm x 0.5 cm pink wound bed (Active)  03/23/21 1402  Location: Buttocks  Location Orientation: Right;Posterior  Staging: Stage 2 -  Partial thickness loss of dermis presenting as a shallow open injury with a red, pink wound bed without slough.  Wound Description (Comments): 0.5 cm x 0.5 cm pink wound bed  Present on Admission: Yes     Pressure Injury 03/23/21 Buttocks Right Stage 2 -  Partial thickness loss of dermis presenting as a shallow open injury with a red, pink wound bed without slough. 3 cm x 1 cm pink wound bed (Active)  03/23/21 1404  Location: Buttocks  Location Orientation: Right  Staging: Stage 2 -  Partial thickness loss of dermis presenting as a shallow open injury with a red, pink wound bed without slough.  Wound Description (Comments): 3 cm x 1 cm pink wound bed  Present on Admission: Yes      The results of significant diagnostics from this hospitalization (including imaging, microbiology, ancillary and laboratory) are listed below for reference.      Microbiology:   Recent Results (from the past 240 hour(s))  Urine Culture     Status: None   Collection Time: 03/22/21  8:55 PM   Specimen: Urine, Catheterized  Result Value Ref Range Status   Specimen Description   Final    URINE, CATHETERIZED Performed at [facility], [address], [place], [place] [address]    Special Requests   Final    NONE Performed at [facility], [address], [place], [place] [address]    Culture   Final    NO GROWTH Performed at [facility], [place] [address] [place], [place] [address]    Report Status 03/25/2021 FINAL  Final  Resp Panel by RT-PCR (Flu A&B, Covid) Nasopharyngeal Swab     Status: None    Collection Time: 03/22/21 10:08 PM   Specimen: Nasopharyngeal Swab; Nasopharyngeal(NP) swabs in vial transport medium  Result Value Ref Range Status   SARS Coronavirus 2 by RT PCR NEGATIVE NEGATIVE Final    Comment: (NOTE) SARS-CoV-2 target nucleic acids are NOT DETECTED.  The SARS-CoV-2 RNA is generally detectable in upper respiratory specimens during the acute phase of infection. The lowest concentration of SARS-CoV-2 viral copies this assay can detect is 138 copies/mL. A negative result does not preclude SARS-Cov-2 infection and should not be used as the sole basis for treatment or other patient management decisions. A negative result may occur with  improper specimen collection/handling, submission of specimen  other than nasopharyngeal swab, presence of viral mutation(s) within the areas targeted by this assay, and inadequate number of viral copies(<138 copies/mL). A negative result must be combined with clinical observations, patient history, and epidemiological information. The expected result is Negative.  Fact Sheet for Patients:  EntrepreneurPulse.com.au  Fact Sheet for Healthcare Providers:  IncredibleEmployment.be  This test is no t yet approved or cleared by the [place] FDA and  has been authorized for detection and/or diagnosis of SARS-CoV-2 by FDA under an Emergency Use Authorization (EUA). This EUA will remain  in effect (meaning this test can be used) for the duration of the COVID-19 declaration under Section 564(b)(1) of the Act, 21 U.S.C.section 360bbb-3(b)(1), unless the authorization is terminated  or revoked sooner.       Influenza A by PCR NEGATIVE NEGATIVE Final   Influenza B by PCR NEGATIVE NEGATIVE Final    Comment: (NOTE) The Xpert Xpress SARS-CoV-2/FLU/RSV plus assay is intended as an aid in the diagnosis of influenza from Nasopharyngeal swab specimens and should not be used as a sole basis for treatment.  Nasal washings and aspirates are unacceptable for Xpert Xpress SARS-CoV-2/FLU/RSV testing.  Fact Sheet for Patients: EntrepreneurPulse.com.au  Fact Sheet for Healthcare Providers: IncredibleEmployment.be  This test is not yet approved or cleared by the [place] FDA and has been authorized for detection and/or diagnosis of SARS-CoV-2 by FDA under an Emergency Use Authorization (EUA). This EUA will remain in effect (meaning this test can be used) for the duration of the COVID-19 declaration under Section 564(b)(1) of the Act, 21 U.S.C. section 360bbb-3(b)(1), unless the authorization is terminated or revoked.  Performed at [facility], [address], [place], [place] [address]   Blood Culture (routine x 2)     Status: None   Collection Time: 03/22/21 10:39 PM   Specimen: BLOOD RIGHT ARM  Result Value Ref Range Status   Specimen Description BLOOD RIGHT ARM  Final   Special Requests   Final    BOTTLES DRAWN AEROBIC AND ANAEROBIC Blood Culture adequate volume   Culture   Final    NO GROWTH 5 DAYS Performed at [facility], [address] [place], [place] [address]    Report Status 03/27/2021 FINAL  Final  Blood Culture (routine x 2)     Status: None   Collection Time: 03/22/21 10:45 PM   Specimen: BLOOD LEFT ARM  Result Value Ref Range Status   Specimen Description BLOOD LEFT ARM  Final   Special Requests   Final    BOTTLES DRAWN AEROBIC AND ANAEROBIC Blood Culture adequate volume   Culture   Final    NO GROWTH 5 DAYS Performed at [facility], [address] [place], [place] [address]    Report Status 03/27/2021 FINAL  Final  MRSA PCR Screening     Status: None   Collection Time: 03/23/21  2:40 AM   Specimen: Nasal Mucosa; Nasopharyngeal  Result Value Ref Range Status   MRSA by PCR NEGATIVE NEGATIVE Final    Comment:        The GeneXpert MRSA Assay (FDA approved for NASAL specimens only), is one component of a comprehensive  MRSA colonization surveillance program. It is not intended to diagnose MRSA infection nor to guide or monitor treatment for MRSA infections. Performed at [facility], [address] [place], [place] [address]   Urine Culture     Status: Abnormal   Collection Time: 03/25/21 11:26 AM   Specimen: Urine, Clean Catch  Result Value  Ref Range Status   Specimen Description   Final    URINE, CLEAN CATCH Performed at [facility], [address], [place], [place] [address]    Special Requests   Final    NONE Performed at [facility], [address] [place], [place] [address]    Culture MULTIPLE SPECIES PRESENT, SUGGEST RECOLLECTION (A)  Final   Report Status 03/27/2021 FINAL  Final     Labs:   CBC: Recent Labs  Lab 03/22/21 1718 03/23/21 0500 03/27/21 0707  WBC 12.3* 11.8* 9.8  NEUTROABS 9.7* 9.6*  --   HGB 11.7* 9.0* 9.2*  HCT 40.9 30.7* 32.0*  MCV 101.0* 97.5 97.9  PLT 292 278 263*   Basic Metabolic Panel: Recent Labs  Lab 03/22/21 1718 03/23/21 0500 03/24/21 1530 03/27/21 0707  NA 140 142 138 140  K 5.3* 4.7 4.7 4.5  CL 105 107 106 106  CO2 '29 27 27 ' 21*  GLUCOSE 59* 167* 150* 143*  BUN 35* 34* 31* 27*  CREATININE 2.06* 2.13* 2.07* 2.04*  CALCIUM 9.3 8.4* 8.5* 8.8*  MG  --  1.6* 1.7  --    Liver Function Tests: Recent Labs  Lab 03/22/21 1718 03/23/21 0500  AST 13* 11*  ALT 11 9  ALKPHOS 72 55  BILITOT 0.7 0.4  PROT 7.5 5.9*  ALBUMIN 2.3* 1.6*   BNP (last 3 results) Recent Labs    01/16/21 1055 03/09/21 1735  BNP 352.0* 87.0   Cardiac Enzymes: No results for input(s): CKTOTAL, CKMB, CKMBINDEX, TROPONINI in the last 168 hours. CBG: Recent Labs  Lab 03/25/21 2116 03/26/21 0716 03/26/21 1058 03/26/21 1604 03/26/21 2222  GLUCAP 153* 127* 135* 145* 93   Hgb A1c No results for input(s): HGBA1C in the last 72 hours. Lipid Profile No results for input(s): CHOL, HDL, LDLCALC, TRIG, CHOLHDL, LDLDIRECT in the last 72 hours. Thyroid function  studies No results for input(s): TSH, T4TOTAL, T3FREE, THYROIDAB in the last 72 hours.  Invalid input(s): FREET3 Anemia work up No results for input(s): VITAMINB12, FOLATE, FERRITIN, TIBC, IRON, RETICCTPCT in the last 72 hours. Urinalysis    Component Value Date/Time   COLORURINE YELLOW 03/22/2021 2055   APPEARANCEUR CLOUDY (A) 03/22/2021 2055   LABSPEC 1.009 03/22/2021 2055   PHURINE 7.0 03/22/2021 2055   GLUCOSEU NEGATIVE 03/22/2021 2055   HGBUR NEGATIVE 03/22/2021 2055   BILIRUBINUR NEGATIVE 03/22/2021 2055   KETONESUR NEGATIVE 03/22/2021 2055   PROTEINUR 100 (A) 03/22/2021 2055   UROBILINOGEN 0.2 09/11/2014 1319   NITRITE NEGATIVE 03/22/2021 2055   LEUKOCYTESUR LARGE (A) 03/22/2021 2055   Pressure Injury 12/03/20 Perineum Posterior Stage 1 -  Intact skin with non-blanchable redness of a localized area usually over a bony prominence. (Active)  12/03/20 2210  Location: Perineum  Location Orientation: Posterior  Staging: Stage 1 -  Intact skin with non-blanchable redness of a localized area usually over a bony prominence.  Wound Description (Comments):   Present on Admission: Yes     Pressure Injury 12/04/20 Coccyx Right;Medial Stage 2 -  Partial thickness loss of dermis presenting as a shallow open injury with a red, pink wound bed without slough. 1cm x 0.5cm area to the right of previously healed pressure ulcer (Active)  12/04/20 0800  Location: Coccyx  Location Orientation: Right;Medial  Staging: Stage 2 -  Partial thickness loss of dermis presenting as a shallow open injury with a red, pink wound bed without slough.  Wound Description (Comments): 1cm x 0.5cm area to the right of  previously healed pressure ulcer  Present on Admission: Yes     Pressure Injury 03/12/21 Sacrum Mid Stage 2 -  Partial thickness loss of dermis presenting as a shallow open injury with a red, pink wound bed without slough. (Active)  03/12/21 1128  Location: Sacrum  Location Orientation: Mid   Staging: Stage 2 -  Partial thickness loss of dermis presenting as a shallow open injury with a red, pink wound bed without slough.  Wound Description (Comments):   Present on Admission: Yes     Pressure Injury 03/12/21 Thigh Right;Upper Stage 2 -  Partial thickness loss of dermis presenting as a shallow open injury with a red, pink wound bed without slough. (Active)  03/12/21 1131  Location: Thigh  Location Orientation: Right;Upper  Staging: Stage 2 -  Partial thickness loss of dermis presenting as a shallow open injury with a red, pink wound bed without slough.  Wound Description (Comments):   Present on Admission: Yes     Pressure Injury 03/23/21 Pretibial Distal;Left Stage 3 -  Full thickness tissue loss. Subcutaneous fat may be visible but bone, tendon or muscle are NOT exposed. 7 cm X 4.5 cm no tunneling (Active)  03/23/21 1344  Location: Pretibial  Location Orientation: Distal;Left  Staging: Stage 3 -  Full thickness tissue loss. Subcutaneous fat may be visible but bone, tendon or muscle are NOT exposed.  Wound Description (Comments): 7 cm X 4.5 cm no tunneling  Present on Admission: Yes     Pressure Injury 03/23/21 Tibial Left;Posterior;Proximal Stage 3 -  Full thickness tissue loss. Subcutaneous fat may be visible but bone, tendon or muscle are NOT exposed. 1 cm X 1 Cm with yellow sloth present (Active)  03/23/21 1357  Location: Tibial  Location Orientation: Left;Posterior;Proximal  Staging: Stage 3 -  Full thickness tissue loss. Subcutaneous fat may be visible but bone, tendon or muscle are NOT exposed.  Wound Description (Comments): 1 cm X 1 Cm with yellow sloth present  Present on Admission: Yes     Pressure Injury 03/23/21 Thigh Anterior;Left Stage 2 -  Partial thickness loss of dermis presenting as a shallow open injury with a red, pink wound bed without slough. 2 cm X 0.5 cm pink wound bed (Active)  03/23/21 1358  Location: Thigh  Location Orientation: Anterior;Left   Staging: Stage 2 -  Partial thickness loss of dermis presenting as a shallow open injury with a red, pink wound bed without slough.  Wound Description (Comments): 2 cm X 0.5 cm pink wound bed  Present on Admission:      Pressure Injury 03/23/21 Thigh Anterior;Right Stage 2 -  Partial thickness loss of dermis presenting as a shallow open injury with a red, pink wound bed without slough. 2 cm x 1 cm pink wound bed (Active)  03/23/21 1400  Location: Thigh  Location Orientation: Anterior;Right  Staging: Stage 2 -  Partial thickness loss of dermis presenting as a shallow open injury with a red, pink wound bed without slough.  Wound Description (Comments): 2 cm x 1 cm pink wound bed  Present on Admission: Yes     Pressure Injury 03/23/21 Buttocks Right;Posterior Stage 2 -  Partial thickness loss of dermis presenting as a shallow open injury with a red, pink wound bed without slough. 0.5 cm x 0.5 cm pink wound bed (Active)  03/23/21 1402  Location: Buttocks  Location Orientation: Right;Posterior  Staging: Stage 2 -  Partial thickness loss of dermis presenting as a shallow open injury with  a red, pink wound bed without slough.  Wound Description (Comments): 0.5 cm x 0.5 cm pink wound bed  Present on Admission: Yes     Pressure Injury 03/23/21 Buttocks Right Stage 2 -  Partial thickness loss of dermis presenting as a shallow open injury with a red, pink wound bed without slough. 3 cm x 1 cm pink wound bed (Active)  03/23/21 1404  Location: Buttocks  Location Orientation: Right  Staging: Stage 2 -  Partial thickness loss of dermis presenting as a shallow open injury with a red, pink wound bed without slough.  Wound Description (Comments): 3 cm x 1 cm pink wound bed  Present on Admission: Yes       Time coordinating discharge: Over 45 minutes  SIGNED: Deatra [name], MD, FACP, [facility]. [facility],  Please use [email or web address] to Page If 7PM-7AM, please contact night-coverage [email or web address],  Password [facility] 03/27/2021, 10:05 AM

## 2021-03-27 NOTE — TOC Transition Note (Signed)
Transition of Care [facility]) - CM/SW Discharge Note   Patient Details  Name: [name] MRN: [id number] Date of Birth: [birth year]  Transition of Care [facility]) CM/SW Contact:  Natasha Bence, LCSW Phone Number: 03/27/2021, 12:55 PM   Clinical Narrative:    CSW notified of patient's readiness for discharge. CSW faxed discharge summary and FL2 to [place] with [facility]. [name] agreeable to take patient. CSW completed med necessity and called EMS. Nurse to call report. TOC signing off.    Final next level of care: Skilled Nursing Facility Barriers to Discharge: Barriers Resolved   Patient Goals and CMS Choice Patient states their goals for this hospitalization and ongoing recovery are:: Return to LTC CMS Medicare.gov Compare Post Acute Care list provided to:: Patient Choice offered to / list presented to : Patient  Discharge Placement              Patient chooses bed at: [facility] Patient to be transferred to facility by: [facility] Name of family member notified: Sindy Guadeloupe [facility])   [phone] Patient and family notified of of transfer: 03/27/21  Discharge Plan and Services                                     Social Determinants of Health (SDOH) Interventions     Readmission Risk Interventions Readmission Risk Prevention Plan 10/31/2020 08/27/2020  Transportation Screening Complete Complete  Medication Review Press photographer) Complete Complete  PCP or Specialist appointment within 3-5 days of discharge - Complete  HRI or [place] - Complete  SW Recovery Care/Counseling Consult Complete Complete  Palliative Care Screening Not Applicable Not Applicable  Skilled Nursing Facility Complete Complete  Some recent data might be hidden

## 2021-03-28 DIAGNOSIS — L98499 Non-pressure chronic ulcer of skin of other sites with unspecified severity: Secondary | ICD-10-CM | POA: Diagnosis not present

## 2021-03-28 DIAGNOSIS — E559 Vitamin D deficiency, unspecified: Secondary | ICD-10-CM | POA: Diagnosis not present

## 2021-03-28 DIAGNOSIS — E1122 Type 2 diabetes mellitus with diabetic chronic kidney disease: Secondary | ICD-10-CM | POA: Diagnosis not present

## 2021-03-28 DIAGNOSIS — I129 Hypertensive chronic kidney disease with stage 1 through stage 4 chronic kidney disease, or unspecified chronic kidney disease: Secondary | ICD-10-CM | POA: Diagnosis not present

## 2021-03-28 DIAGNOSIS — E872 Acidosis: Secondary | ICD-10-CM | POA: Diagnosis not present

## 2021-03-28 DIAGNOSIS — D631 Anemia in chronic kidney disease: Secondary | ICD-10-CM | POA: Diagnosis not present

## 2021-03-28 DIAGNOSIS — N189 Chronic kidney disease, unspecified: Secondary | ICD-10-CM | POA: Diagnosis not present

## 2021-03-28 DIAGNOSIS — I480 Paroxysmal atrial fibrillation: Secondary | ICD-10-CM | POA: Diagnosis not present

## 2021-03-28 DIAGNOSIS — I517 Cardiomegaly: Secondary | ICD-10-CM | POA: Diagnosis not present

## 2021-03-28 DIAGNOSIS — E211 Secondary hyperparathyroidism, not elsewhere classified: Secondary | ICD-10-CM | POA: Diagnosis not present

## 2021-03-28 DIAGNOSIS — R809 Proteinuria, unspecified: Secondary | ICD-10-CM | POA: Diagnosis not present

## 2021-03-28 LAB — GLUCOSE, CAPILLARY: Glucose-Capillary: 134 mg/dL — ABNORMAL HIGH (ref 70–99)

## 2021-03-29 DIAGNOSIS — M25561 Pain in right knee: Secondary | ICD-10-CM | POA: Diagnosis not present

## 2021-03-29 DIAGNOSIS — M25562 Pain in left knee: Secondary | ICD-10-CM | POA: Diagnosis not present

## 2021-03-29 DIAGNOSIS — R293 Abnormal posture: Secondary | ICD-10-CM | POA: Diagnosis not present

## 2021-03-29 DIAGNOSIS — M6281 Muscle weakness (generalized): Secondary | ICD-10-CM | POA: Diagnosis not present

## 2021-03-29 DIAGNOSIS — R279 Unspecified lack of coordination: Secondary | ICD-10-CM | POA: Diagnosis not present

## 2021-03-29 DIAGNOSIS — J449 Chronic obstructive pulmonary disease, unspecified: Secondary | ICD-10-CM | POA: Diagnosis not present

## 2021-03-31 ENCOUNTER — Telehealth: Payer: Self-pay

## 2021-03-31 ENCOUNTER — Encounter: Payer: Self-pay | Admitting: Orthopedic Surgery

## 2021-03-31 ENCOUNTER — Ambulatory Visit (INDEPENDENT_AMBULATORY_CARE_PROVIDER_SITE_OTHER): Payer: Medicare Other | Admitting: Orthopedic Surgery

## 2021-03-31 ENCOUNTER — Other Ambulatory Visit: Payer: Self-pay

## 2021-03-31 DIAGNOSIS — S81801A Unspecified open wound, right lower leg, initial encounter: Secondary | ICD-10-CM | POA: Diagnosis not present

## 2021-03-31 DIAGNOSIS — I739 Peripheral vascular disease, unspecified: Secondary | ICD-10-CM | POA: Diagnosis not present

## 2021-03-31 DIAGNOSIS — S81802A Unspecified open wound, left lower leg, initial encounter: Secondary | ICD-10-CM | POA: Diagnosis not present
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
## 2021-03-31 NOTE — Telephone Encounter (Signed)
Faxed dictation from office visit today as requested.

## 2021-03-31 NOTE — Telephone Encounter (Signed)
Sani from [facility] called she is requesting clinical notes and any new orders the patient may have received during today's visit call back: [phone] fax:[phone]

## 2021-04-01 ENCOUNTER — Other Ambulatory Visit: Payer: Self-pay

## 2021-04-01 ENCOUNTER — Emergency Department (HOSPITAL_COMMUNITY): Payer: Medicare Other

## 2021-04-01 ENCOUNTER — Inpatient Hospital Stay (HOSPITAL_COMMUNITY)
Admission: EM | Admit: 2021-04-01 | Discharge: 2021-04-04 | DRG: 299 | Disposition: A | Discharge status: Skilled Nursing Facility | Payer: Medicare Other | Source: Skilled Nursing Facility | Attending: Family Medicine | Admitting: Family Medicine

## 2021-04-01 ENCOUNTER — Other Ambulatory Visit: Payer: Self-pay | Admitting: *Deleted

## 2021-04-01 ENCOUNTER — Encounter (HOSPITAL_COMMUNITY): Payer: Medicare Other

## 2021-04-01 ENCOUNTER — Other Ambulatory Visit (HOSPITAL_COMMUNITY): Payer: Self-pay | Admitting: Orthopedic Surgery

## 2021-04-01 ENCOUNTER — Encounter (HOSPITAL_COMMUNITY): Payer: Self-pay | Admitting: Emergency Medicine

## 2021-04-01 DIAGNOSIS — S0990XA Unspecified injury of head, initial encounter: Secondary | ICD-10-CM | POA: Diagnosis not present

## 2021-04-01 DIAGNOSIS — Z825 Family history of asthma and other chronic lower respiratory diseases: Secondary | ICD-10-CM

## 2021-04-01 DIAGNOSIS — I5042 Chronic combined systolic (congestive) and diastolic (congestive) heart failure: Secondary | ICD-10-CM | POA: Diagnosis not present

## 2021-04-01 DIAGNOSIS — I10 Essential (primary) hypertension: Secondary | ICD-10-CM | POA: Diagnosis not present

## 2021-04-01 DIAGNOSIS — E1159 Type 2 diabetes mellitus with other circulatory complications: Secondary | ICD-10-CM | POA: Diagnosis not present

## 2021-04-01 DIAGNOSIS — J9611 Chronic respiratory failure with hypoxia: Secondary | ICD-10-CM | POA: Diagnosis present

## 2021-04-01 DIAGNOSIS — R293 Abnormal posture: Secondary | ICD-10-CM | POA: Diagnosis not present

## 2021-04-01 DIAGNOSIS — L0390 Cellulitis, unspecified: Secondary | ICD-10-CM | POA: Diagnosis present

## 2021-04-01 DIAGNOSIS — S81809A Unspecified open wound, unspecified lower leg, initial encounter: Secondary | ICD-10-CM | POA: Diagnosis not present

## 2021-04-01 DIAGNOSIS — A419 Sepsis, unspecified organism: Secondary | ICD-10-CM | POA: Diagnosis not present

## 2021-04-01 DIAGNOSIS — I130 Hypertensive heart and chronic kidney disease with heart failure and stage 1 through stage 4 chronic kidney disease, or unspecified chronic kidney disease: Secondary | ICD-10-CM | POA: Diagnosis not present

## 2021-04-01 DIAGNOSIS — M47816 Spondylosis without myelopathy or radiculopathy, lumbar region: Secondary | ICD-10-CM | POA: Diagnosis not present

## 2021-04-01 DIAGNOSIS — E11621 Type 2 diabetes mellitus with foot ulcer: Secondary | ICD-10-CM | POA: Diagnosis present

## 2021-04-01 DIAGNOSIS — Z7901 Long term (current) use of anticoagulants: Secondary | ICD-10-CM | POA: Diagnosis not present

## 2021-04-01 DIAGNOSIS — N183 Chronic kidney disease, stage 3 unspecified: Secondary | ICD-10-CM | POA: Diagnosis not present

## 2021-04-01 DIAGNOSIS — E785 Hyperlipidemia, unspecified: Secondary | ICD-10-CM | POA: Diagnosis not present

## 2021-04-01 DIAGNOSIS — L089 Local infection of the skin and subcutaneous tissue, unspecified: Secondary | ICD-10-CM

## 2021-04-01 DIAGNOSIS — E669 Obesity, unspecified: Secondary | ICD-10-CM | POA: Diagnosis present

## 2021-04-01 DIAGNOSIS — Z8249 Family history of ischemic heart disease and other diseases of the circulatory system: Secondary | ICD-10-CM | POA: Diagnosis not present

## 2021-04-01 DIAGNOSIS — E43 Unspecified severe protein-calorie malnutrition: Secondary | ICD-10-CM | POA: Diagnosis present

## 2021-04-01 DIAGNOSIS — R1030 Lower abdominal pain, unspecified: Secondary | ICD-10-CM | POA: Diagnosis present

## 2021-04-01 DIAGNOSIS — E876 Hypokalemia: Secondary | ICD-10-CM | POA: Diagnosis present

## 2021-04-01 DIAGNOSIS — S93145A Subluxation of metatarsophalangeal joint of left lesser toe(s), initial encounter: Secondary | ICD-10-CM | POA: Diagnosis not present

## 2021-04-01 DIAGNOSIS — I739 Peripheral vascular disease, unspecified: Secondary | ICD-10-CM

## 2021-04-01 DIAGNOSIS — E1142 Type 2 diabetes mellitus with diabetic polyneuropathy: Secondary | ICD-10-CM | POA: Diagnosis not present

## 2021-04-01 DIAGNOSIS — Z20822 Contact with and (suspected) exposure to covid-19: Secondary | ICD-10-CM | POA: Diagnosis not present

## 2021-04-01 DIAGNOSIS — E1165 Type 2 diabetes mellitus with hyperglycemia: Secondary | ICD-10-CM | POA: Diagnosis not present

## 2021-04-01 DIAGNOSIS — Z6834 Body mass index (BMI) 34.0-34.9, adult: Secondary | ICD-10-CM | POA: Diagnosis not present

## 2021-04-01 DIAGNOSIS — M47814 Spondylosis without myelopathy or radiculopathy, thoracic region: Secondary | ICD-10-CM | POA: Diagnosis not present

## 2021-04-01 DIAGNOSIS — Z833 Family history of diabetes mellitus: Secondary | ICD-10-CM

## 2021-04-01 DIAGNOSIS — W19XXXA Unspecified fall, initial encounter: Secondary | ICD-10-CM | POA: Diagnosis not present

## 2021-04-01 DIAGNOSIS — Z9981 Dependence on supplemental oxygen: Secondary | ICD-10-CM

## 2021-04-01 DIAGNOSIS — J449 Chronic obstructive pulmonary disease, unspecified: Secondary | ICD-10-CM | POA: Diagnosis not present

## 2021-04-01 DIAGNOSIS — R279 Unspecified lack of coordination: Secondary | ICD-10-CM | POA: Diagnosis not present

## 2021-04-01 DIAGNOSIS — Z794 Long term (current) use of insulin: Secondary | ICD-10-CM

## 2021-04-01 DIAGNOSIS — K828 Other specified diseases of gallbladder: Secondary | ICD-10-CM | POA: Diagnosis not present

## 2021-04-01 DIAGNOSIS — W1830XA Fall on same level, unspecified, initial encounter: Secondary | ICD-10-CM | POA: Diagnosis present

## 2021-04-01 DIAGNOSIS — Z743 Need for continuous supervision: Secondary | ICD-10-CM | POA: Diagnosis not present

## 2021-04-01 DIAGNOSIS — L03115 Cellulitis of right lower limb: Secondary | ICD-10-CM | POA: Diagnosis present

## 2021-04-01 DIAGNOSIS — R5381 Other malaise: Secondary | ICD-10-CM | POA: Diagnosis not present

## 2021-04-01 DIAGNOSIS — I480 Paroxysmal atrial fibrillation: Secondary | ICD-10-CM | POA: Diagnosis not present

## 2021-04-01 DIAGNOSIS — M25562 Pain in left knee: Secondary | ICD-10-CM | POA: Diagnosis not present

## 2021-04-01 DIAGNOSIS — F17200 Nicotine dependence, unspecified, uncomplicated: Secondary | ICD-10-CM | POA: Diagnosis not present

## 2021-04-01 DIAGNOSIS — L97519 Non-pressure chronic ulcer of other part of right foot with unspecified severity: Secondary | ICD-10-CM | POA: Diagnosis not present

## 2021-04-01 DIAGNOSIS — E1151 Type 2 diabetes mellitus with diabetic peripheral angiopathy without gangrene: Secondary | ICD-10-CM | POA: Diagnosis not present

## 2021-04-01 DIAGNOSIS — R000 Tachycardia, unspecified: Secondary | ICD-10-CM | POA: Diagnosis not present

## 2021-04-01 DIAGNOSIS — M25552 Pain in left hip: Secondary | ICD-10-CM | POA: Diagnosis not present

## 2021-04-01 DIAGNOSIS — M25561 Pain in right knee: Secondary | ICD-10-CM | POA: Diagnosis not present

## 2021-04-01 DIAGNOSIS — S81801D Unspecified open wound, right lower leg, subsequent encounter: Secondary | ICD-10-CM

## 2021-04-01 DIAGNOSIS — R109 Unspecified abdominal pain: Secondary | ICD-10-CM | POA: Diagnosis not present

## 2021-04-01 DIAGNOSIS — K839 Disease of biliary tract, unspecified: Secondary | ICD-10-CM | POA: Diagnosis not present

## 2021-04-01 DIAGNOSIS — S93144A Subluxation of metatarsophalangeal joint of right lesser toe(s), initial encounter: Secondary | ICD-10-CM | POA: Diagnosis not present

## 2021-04-01 DIAGNOSIS — M6281 Muscle weakness (generalized): Secondary | ICD-10-CM | POA: Diagnosis not present

## 2021-04-01 DIAGNOSIS — M4316 Spondylolisthesis, lumbar region: Secondary | ICD-10-CM | POA: Diagnosis not present

## 2021-04-01 HISTORY — DX: Sepsis, unspecified organism: A41.9

## 2021-04-01 HISTORY — DX: Local infection of the skin and subcutaneous tissue, unspecified: L08.9

## 2021-04-01 LAB — COMPREHENSIVE METABOLIC PANEL
ALT: 9 U/L (ref 0–44)
AST: 10 U/L — ABNORMAL LOW (ref 15–41)
Albumin: 1.8 g/dL — ABNORMAL LOW (ref 3.5–5.0)
Alkaline Phosphatase: 69 U/L (ref 38–126)
Anion gap: 10 (ref 5–15)
BUN: 19 mg/dL (ref 6–20)
CO2: 26 mmol/L (ref 22–32)
Calcium: 8.8 mg/dL — ABNORMAL LOW (ref 8.9–10.3)
Chloride: 106 mmol/L (ref 98–111)
Creatinine, Ser: 1.64 mg/dL — ABNORMAL HIGH (ref 0.44–1.00)
GFR, Estimated: 37 mL/min — ABNORMAL LOW (ref >60)
Glucose, Bld: 191 mg/dL — ABNORMAL HIGH (ref 70–99)
Potassium: 3.6 mmol/L (ref 3.5–5.1)
Sodium: 142 mmol/L (ref 135–145)
Total Bilirubin: 0.7 mg/dL (ref 0.3–1.2)
Total Protein: 7 g/dL (ref 6.5–8.1)

## 2021-04-01 LAB — URINALYSIS, ROUTINE W REFLEX MICROSCOPIC
Bilirubin Urine: NEGATIVE
Glucose, UA: 50 mg/dL — AB
Hgb urine dipstick: NEGATIVE
Ketones, ur: NEGATIVE mg/dL
Nitrite: NEGATIVE
Protein, ur: 100 mg/dL — AB
Specific Gravity, Urine: 1.008 (ref 1.005–1.030)
pH: 6 (ref 5.0–8.0)

## 2021-04-01 LAB — CBC WITH DIFFERENTIAL/PLATELET
Abs Immature Granulocytes: 0.2 10*3/uL — ABNORMAL HIGH (ref 0.00–0.07)
Basophils Absolute: 0.1 10*3/uL (ref 0.0–0.1)
Basophils Relative: 1 %
Eosinophils Absolute: 0.2 10*3/uL (ref 0.0–0.5)
Eosinophils Relative: 1 %
HCT: 32.5 % — ABNORMAL LOW (ref 36.0–46.0)
Hemoglobin: 9.6 g/dL — ABNORMAL LOW (ref 12.0–15.0)
Immature Granulocytes: 1 %
Lymphocytes Relative: 11 %
Lymphs Abs: 1.6 10*3/uL (ref 0.7–4.0)
MCH: 28.6 pg (ref 26.0–34.0)
MCHC: 29.5 g/dL — ABNORMAL LOW (ref 30.0–36.0)
MCV: 96.7 fL (ref 80.0–100.0)
Monocytes Absolute: 0.9 10*3/uL (ref 0.1–1.0)
Monocytes Relative: 6 %
Neutro Abs: 12.2 10*3/uL — ABNORMAL HIGH (ref 1.7–7.7)
Neutrophils Relative %: 80 %
Platelets: 536 10*3/uL — ABNORMAL HIGH (ref 150–400)
RBC: 3.36 MIL/uL — ABNORMAL LOW (ref 3.87–5.11)
RDW: 15.2 % (ref 11.5–15.5)
WBC: 15.2 10*3/uL — ABNORMAL HIGH (ref 4.0–10.5)
nRBC: 0.3 % — ABNORMAL HIGH (ref 0.0–0.2)

## 2021-04-01 LAB — RESP PANEL BY RT-PCR (FLU A&B, COVID) ARPGX2
Influenza A by PCR: NEGATIVE
Influenza B by PCR: NEGATIVE
SARS Coronavirus 2 by RT PCR: NEGATIVE

## 2021-04-01 LAB — GLUCOSE, CAPILLARY: Glucose-Capillary: 185 mg/dL — ABNORMAL HIGH (ref 70–99)

## 2021-04-01 LAB — LACTIC ACID, PLASMA: Lactic Acid, Venous: 0.8 mmol/L (ref 0.5–1.9)

## 2021-04-01 MED ORDER — AMLODIPINE BESYLATE 5 MG PO TABS
10.0000 mg | ORAL_TABLET | Freq: Every day | ORAL | Status: DC
  Administered 2021-04-02 – 2021-04-03 (×2): 10 mg via ORAL
  Filled 2021-04-01 (×2): qty 2

## 2021-04-01 MED ORDER — VANCOMYCIN HCL IN DEXTROSE 1-5 GM/200ML-% IV SOLN
1000.0000 mg | Freq: Once | INTRAVENOUS | Status: AC
  Administered 2021-04-01: 1000 mg via INTRAVENOUS
  Filled 2021-04-01: qty 200

## 2021-04-01 MED ORDER — PANTOPRAZOLE SODIUM 40 MG PO TBEC
40.0000 mg | DELAYED_RELEASE_TABLET | Freq: Every day | ORAL | Status: DC
  Administered 2021-04-02 – 2021-04-03 (×2): 40 mg via ORAL
  Filled 2021-04-01 (×2): qty 1

## 2021-04-01 MED ORDER — TRAZODONE HCL 50 MG PO TABS
50.0000 mg | ORAL_TABLET | Freq: Every evening | ORAL | Status: DC | PRN
  Filled 2021-04-01: qty 1

## 2021-04-01 MED ORDER — SODIUM CHLORIDE 0.9 % IV BOLUS
1000.0000 mL | Freq: Once | INTRAVENOUS | Status: AC
  Administered 2021-04-01: 1000 mL via INTRAVENOUS

## 2021-04-01 MED ORDER — TIOTROPIUM BROMIDE MONOHYDRATE 18 MCG IN CAPS: 18.0000 ug | ORAL_CAPSULE | Freq: Every day | RESPIRATORY_TRACT | Status: DC | PRN

## 2021-04-01 MED ORDER — OXYBUTYNIN CHLORIDE ER 5 MG PO TB24
5.0000 mg | ORAL_TABLET | Freq: Every day | ORAL | Status: DC
  Administered 2021-04-01 – 2021-04-02 (×2): 5 mg via ORAL
  Filled 2021-04-01 (×3): qty 1

## 2021-04-01 MED ORDER — LEVETIRACETAM 500 MG PO TABS
750.0000 mg | ORAL_TABLET | Freq: Two times a day (BID) | ORAL | Status: DC
  Administered 2021-04-02 – 2021-04-03 (×4): 750 mg via ORAL
  Filled 2021-04-01 (×5): qty 1

## 2021-04-01 MED ORDER — METOPROLOL TARTRATE 25 MG PO TABS
25.0000 mg | ORAL_TABLET | Freq: Two times a day (BID) | ORAL | Status: DC
  Administered 2021-04-01 – 2021-04-02 (×3): 25 mg via ORAL
  Filled 2021-04-01 (×5): qty 1

## 2021-04-01 MED ORDER — NICOTINE 21 MG/24HR TD PT24
21.0000 mg | MEDICATED_PATCH | Freq: Every day | TRANSDERMAL | Status: DC
  Administered 2021-04-01 – 2021-04-03 (×3): 21 mg via TRANSDERMAL
  Filled 2021-04-01 (×3): qty 1

## 2021-04-01 MED ORDER — ALBUTEROL SULFATE HFA 108 (90 BASE) MCG/ACT IN AERS: 2.0000 | INHALATION_SPRAY | Freq: Four times a day (QID) | RESPIRATORY_TRACT | Status: DC | PRN

## 2021-04-01 MED ORDER — ONDANSETRON HCL 4 MG PO TABS: 4.0000 mg | ORAL_TABLET | Freq: Four times a day (QID) | ORAL | Status: DC | PRN

## 2021-04-01 MED ORDER — ONDANSETRON HCL 4 MG/2ML IJ SOLN: 4.0000 mg | Freq: Four times a day (QID) | INTRAMUSCULAR | Status: DC | PRN

## 2021-04-01 MED ORDER — UMECLIDINIUM BROMIDE 62.5 MCG/INH IN AEPB
1.0000 | INHALATION_SPRAY | Freq: Every day | RESPIRATORY_TRACT | Status: DC
  Filled 2021-04-01: qty 7

## 2021-04-01 MED ORDER — INSULIN ASPART 100 UNIT/ML IJ SOLN
0.0000 [IU] | Freq: Three times a day (TID) | INTRAMUSCULAR | Status: DC
  Administered 2021-04-02: 4 [IU] via SUBCUTANEOUS
  Administered 2021-04-02: 3 [IU] via SUBCUTANEOUS
  Administered 2021-04-03: 4 [IU] via SUBCUTANEOUS
  Administered 2021-04-03: 2 [IU] via SUBCUTANEOUS
  Administered 2021-04-03: 4 [IU] via SUBCUTANEOUS

## 2021-04-01 MED ORDER — HYDROMORPHONE HCL 1 MG/ML IJ SOLN
0.5000 mg | Freq: Once | INTRAMUSCULAR | Status: AC
  Administered 2021-04-01: 0.5 mg via INTRAVENOUS
  Filled 2021-04-01: qty 1

## 2021-04-01 MED ORDER — LORAZEPAM 0.5 MG PO TABS
0.5000 mg | ORAL_TABLET | Freq: Four times a day (QID) | ORAL | Status: DC | PRN
  Administered 2021-04-02: 0.5 mg via ORAL
  Filled 2021-04-01 (×2): qty 1

## 2021-04-01 MED ORDER — OXYCODONE HCL 5 MG PO TABS
5.0000 mg | ORAL_TABLET | ORAL | Status: DC | PRN
  Administered 2021-04-01 – 2021-04-03 (×7): 5 mg via ORAL
  Filled 2021-04-01 (×7): qty 1

## 2021-04-01 MED ORDER — APIXABAN 5 MG PO TABS
5.0000 mg | ORAL_TABLET | Freq: Two times a day (BID) | ORAL | Status: DC
  Administered 2021-04-01 – 2021-04-03 (×4): 5 mg via ORAL
  Filled 2021-04-01 (×5): qty 1

## 2021-04-01 MED ORDER — INSULIN ASPART 100 UNIT/ML IJ SOLN: 0.0000 [IU] | Freq: Every day | INTRAMUSCULAR | Status: DC

## 2021-04-01 MED ORDER — ASCORBIC ACID 500 MG PO TABS
500.0000 mg | ORAL_TABLET | Freq: Every day | ORAL | Status: DC
  Administered 2021-04-02 – 2021-04-03 (×2): 500 mg via ORAL
  Filled 2021-04-01 (×2): qty 1

## 2021-04-01 MED ORDER — ACETAMINOPHEN 650 MG RE SUPP: 650.0000 mg | Freq: Four times a day (QID) | RECTAL | Status: DC | PRN

## 2021-04-01 MED ORDER — ACETAMINOPHEN 325 MG PO TABS
650.0000 mg | ORAL_TABLET | Freq: Four times a day (QID) | ORAL | Status: DC | PRN
  Administered 2021-04-03: 650 mg via ORAL
  Filled 2021-04-01: qty 2

## 2021-04-01 MED ORDER — MORPHINE SULFATE (PF) 2 MG/ML IV SOLN: 2.0000 mg | INTRAVENOUS | Status: DC | PRN

## 2021-04-01 MED ORDER — METOPROLOL TARTRATE 5 MG/5ML IV SOLN: 5.0000 mg | Freq: Once | INTRAVENOUS | Status: DC

## 2021-04-01 MED ORDER — ALBUTEROL SULFATE (2.5 MG/3ML) 0.083% IN NEBU: 2.5000 mg | INHALATION_SOLUTION | Freq: Four times a day (QID) | RESPIRATORY_TRACT | Status: DC | PRN

## 2021-04-01 MED ORDER — HEPARIN SODIUM (PORCINE) 5000 UNIT/ML IJ SOLN: 5000.0000 [IU] | Freq: Three times a day (TID) | INTRAMUSCULAR | Status: DC

## 2021-04-01 MED ORDER — POLYETHYLENE GLYCOL 3350 17 G PO PACK: 17.0000 g | PACK | Freq: Every day | ORAL | Status: DC | PRN

## 2021-04-01 MED ORDER — MORPHINE SULFATE (PF) 4 MG/ML IV SOLN
4.0000 mg | Freq: Once | INTRAVENOUS | Status: AC
  Administered 2021-04-01: 4 mg via INTRAVENOUS
  Filled 2021-04-01: qty 1

## 2021-04-01 MED ORDER — ZINC SULFATE 220 (50 ZN) MG PO CAPS
220.0000 mg | ORAL_CAPSULE | Freq: Every day | ORAL | Status: DC
  Administered 2021-04-02 – 2021-04-03 (×2): 220 mg via ORAL
  Filled 2021-04-01 (×2): qty 1

## 2021-04-01 MED ORDER — SODIUM CHLORIDE 0.9 % IV BOLUS
500.0000 mL | Freq: Once | INTRAVENOUS | Status: AC
  Administered 2021-04-01: 500 mL via INTRAVENOUS

## 2021-04-01 MED ORDER — DULOXETINE HCL 60 MG PO CPEP
60.0000 mg | ORAL_CAPSULE | Freq: Every day | ORAL | Status: DC
  Administered 2021-04-02 – 2021-04-03 (×2): 60 mg via ORAL
  Filled 2021-04-01 (×2): qty 1

## 2021-04-01 MED ORDER — FUROSEMIDE 20 MG PO TABS
20.0000 mg | ORAL_TABLET | ORAL | Status: DC
  Administered 2021-04-03: 20 mg via ORAL
  Filled 2021-04-01: qty 1

## 2021-04-01 NOTE — ED Notes (Signed)
Patient transported to CT 

## 2021-04-01 NOTE — ED Notes (Signed)
Per Dr. Clearence Ped metoprolol is to be held at this time due to soft BP. To be held unless pt becomes symptomatic or elevated HR sustains.

## 2021-04-01 NOTE — ED Notes (Signed)
Cleaned patient up applied pure wick and pulled her up in bed, patient comfortable with call light in reach.

## 2021-04-01 NOTE — ED Provider Notes (Signed)
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 2125    Lajean Saver, MD 04/04/21 [phone]
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
## 2021-04-01 NOTE — ED Notes (Signed)
Patient's wound's undressed and cleansed with sterile water to help remove dried bandages. Patient screaming out in pain when changing bandages but closes eyes and turns over when asked any further questions of pain.

## 2021-04-01 NOTE — ED Notes (Signed)
Patient given 2 warm blankets and repositioned in bed at this time. Patient given diet ginger ale as well. Patient voices no complaints at this time.

## 2021-04-01 NOTE — ED Notes (Signed)
Repeat EKG given to Dr. Ileene Patrick at this time.

## 2021-04-02 ENCOUNTER — Encounter: Payer: Medicare Other | Admitting: Vascular Surgery

## 2021-04-02 ENCOUNTER — Inpatient Hospital Stay (HOSPITAL_COMMUNITY): Payer: Medicare Other

## 2021-04-02 DIAGNOSIS — L03115 Cellulitis of right lower limb: Secondary | ICD-10-CM

## 2021-04-02 DIAGNOSIS — I10 Essential (primary) hypertension: Secondary | ICD-10-CM

## 2021-04-02 DIAGNOSIS — E1159 Type 2 diabetes mellitus with other circulatory complications: Secondary | ICD-10-CM

## 2021-04-02 DIAGNOSIS — J9611 Chronic respiratory failure with hypoxia: Secondary | ICD-10-CM

## 2021-04-02 DIAGNOSIS — E43 Unspecified severe protein-calorie malnutrition: Secondary | ICD-10-CM

## 2021-04-02 DIAGNOSIS — K828 Other specified diseases of gallbladder: Secondary | ICD-10-CM

## 2021-04-02 LAB — GLUCOSE, CAPILLARY
Glucose-Capillary: 170 mg/dL — ABNORMAL HIGH (ref 70–99)
Glucose-Capillary: 120 mg/dL — ABNORMAL HIGH (ref 70–99)
Glucose-Capillary: 148 mg/dL — ABNORMAL HIGH (ref 70–99)
Glucose-Capillary: 111 mg/dL — ABNORMAL HIGH (ref 70–99)

## 2021-04-02 LAB — COMPREHENSIVE METABOLIC PANEL
ALT: 9 U/L (ref 0–44)
AST: 8 U/L — ABNORMAL LOW (ref 15–41)
Albumin: 1.8 g/dL — ABNORMAL LOW (ref 3.5–5.0)
Alkaline Phosphatase: 62 U/L (ref 38–126)
Anion gap: 8 (ref 5–15)
BUN: 17 mg/dL (ref 6–20)
CO2: 24 mmol/L (ref 22–32)
Calcium: 8.5 mg/dL — ABNORMAL LOW (ref 8.9–10.3)
Chloride: 109 mmol/L (ref 98–111)
Creatinine, Ser: 1.45 mg/dL — ABNORMAL HIGH (ref 0.44–1.00)
GFR, Estimated: 42 mL/min — ABNORMAL LOW (ref >60)
Glucose, Bld: 176 mg/dL — ABNORMAL HIGH (ref 70–99)
Potassium: 3.6 mmol/L (ref 3.5–5.1)
Sodium: 141 mmol/L (ref 135–145)
Total Bilirubin: 0.5 mg/dL (ref 0.3–1.2)
Total Protein: 6.2 g/dL — ABNORMAL LOW (ref 6.5–8.1)

## 2021-04-02 LAB — CBC
HCT: 31.9 % — ABNORMAL LOW (ref 36.0–46.0)
Hemoglobin: 9.2 g/dL — ABNORMAL LOW (ref 12.0–15.0)
MCH: 27.8 pg (ref 26.0–34.0)
MCHC: 28.8 g/dL — ABNORMAL LOW (ref 30.0–36.0)
MCV: 96.4 fL (ref 80.0–100.0)
Platelets: 535 10*3/uL — ABNORMAL HIGH (ref 150–400)
RBC: 3.31 MIL/uL — ABNORMAL LOW (ref 3.87–5.11)
RDW: 15.2 % (ref 11.5–15.5)
WBC: 11.5 10*3/uL — ABNORMAL HIGH (ref 4.0–10.5)
nRBC: 0.5 % — ABNORMAL HIGH (ref 0.0–0.2)

## 2021-04-02 LAB — MAGNESIUM: Magnesium: 1.7 mg/dL (ref 1.7–2.4)

## 2021-04-02 MED ORDER — GLUCERNA SHAKE PO LIQD
237.0000 mL | Freq: Three times a day (TID) | ORAL | Status: DC
  Administered 2021-04-02 – 2021-04-03 (×4): 237 mL via ORAL

## 2021-04-02 MED ORDER — VANCOMYCIN HCL 500 MG/100ML IV SOLN
500.0000 mg | INTRAVENOUS | Status: DC
  Administered 2021-04-03: 500 mg via INTRAVENOUS
  Filled 2021-04-02 (×5): qty 100

## 2021-04-02 MED ORDER — METOPROLOL TARTRATE 5 MG/5ML IV SOLN
5.0000 mg | Freq: Once | INTRAVENOUS | Status: AC
  Administered 2021-04-02: 5 mg via INTRAVENOUS
  Filled 2021-04-02: qty 5

## 2021-04-02 NOTE — Progress Notes (Signed)
PT Cancellation Note  Patient Details Name: [name] MRN: [id number] DOB: [birth year]   Cancelled Treatment:    Reason Eval/Treat Not Completed: PT screened, no needs identified, will sign off.  Patient states she is non-ambulatory and SNF staff uses mechanical lift to transfer patient to wheelchair, at most she rolls side to side in bed and has been doing so with nursing staff here since admission.    12:01 PM, 04/02/21 Lonell Grandchild, MPT Physical Therapist with [facility] [phone] office [phone] mobile phone
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
## 2021-04-02 NOTE — Progress Notes (Signed)
EKG done and given to nurse 

## 2021-04-02 NOTE — Progress Notes (Signed)
Pt refused her IV Vanc. Pt stated this RN "looks like the Devil" and "don't trust anything from you". This RN explained to pt that it was her ABX to help her legs. Pt then repeatedly yelled out "HELP" and would not let this RN speak further.  Charge RN aware.   Pt has called 911 multiple times on her room phone stating she needed help.  Pt also called her sister with multiple complaints. Pt's sister then called this RN as well as the NT. This RN explained to sister that pt did have a bath today, along with food. Pt's sister then stated that she "knows how she is".   Multiple pt's on the floor have complained of pt's yelling. As it's almost Midnight and pt continues to yell out "help" despite multiple visits in her room by multiple staff.

## 2021-04-02 NOTE — Progress Notes (Signed)
Patient's heart rate elevated ranging from 110's-160's on telemetry. Upon walking in the room patient is sleeping. MD notified. Patient refusing for blood pressure to be obtained. No new orders at this time.

## 2021-04-02 NOTE — Progress Notes (Signed)
Pharmacy Antibiotic Note  [name] is a 55 y.o. female admitted on 04/01/2021 with wound infection/cellulitis.  Pharmacy has been consulted for Vancomycin dosing. Lower extremity wounds. WBC elevated. Noted renal dysfunction.   Plan: Vancomycin 1000 mg IV x 1 already given, then 500 mg IV q24h >>Estimated AUC: 424 Trend WBC, temp, renal function  F/U infectious work-up Drug levels as indicated   Height: 5\' 1"  (154.9 cm) Weight: 83.7 kg (184 lb 8.4 oz) IBW/kg (Calculated) : 47.8  Temp (24hrs), Avg:98.3 F (36.8 C), Min:98.2 F (36.8 C), Max:98.4 F (36.9 C)  Recent Labs  Lab 03/27/21 0707 04/01/21 1341 04/01/21 2055  WBC 9.8 15.2*  --   CREATININE 2.04* 1.64*  --   LATICACIDVEN  --   --  0.8    Estimated Creatinine Clearance: 37.6 mL/min (A) (by C-G formula based on SCr of 1.64 mg/dL (H)).    No Known Allergies  Narda Bonds, PharmD, BCPS Clinical Pharmacist Phone: [phone]

## 2021-04-02 NOTE — Progress Notes (Signed)
   04/02/21 0125  Vitals  BP (!) 148/96  BP Location Right Arm  BP Method Manual  ECG Heart Rate (!) 136  MEWS COLOR  MEWS Score Color Yellow  MEWS Score  MEWS Temp 0  MEWS Systolic 0  MEWS Pulse 3  MEWS RR 0  MEWS LOC 0  MEWS Score 3   Notified by telemetry that patient's heart rate was ranging from 100-150 on monitor, afib. EKG ordered. Patient stated she does have history of afib. Vital signs obtained. Last given metoprolol 25mg  PO at 2350. MD notified. One time dose for metoprolol 5mg  IV once given.

## 2021-04-02 NOTE — Progress Notes (Signed)
Patient yelling and has multiple complaints.  Patient states she has not eaten, nor had a bath.  Patient has had a bath charted at approximately 1430 and patient has food in her room. Patient has had vitals charted during day and patient has had cbgs during day with no episodes of hypoglycemia during day.

## 2021-04-02 NOTE — Progress Notes (Signed)
ASSUMPTION OF CARE NOTE   04/02/2021 12:13 PM  [name] was seen and examined.  The H&P by the admitting provider, orders, imaging was reviewed.  Please see new orders.  Will continue to follow.   Vitals:   04/02/21 1043 04/02/21 1046  BP: (!) 124/96 (!) 124/96  Pulse:  82  Resp:    Temp:    SpO2:      Results for orders placed or performed during the hospital encounter of 04/01/21  Blood culture (routine x 2)   Specimen: BLOOD LEFT FOREARM  Result Value Ref Range   Specimen Description BLOOD LEFT FOREARM    Special Requests      BOTTLES DRAWN AEROBIC AND ANAEROBIC Blood Culture adequate volume   Culture      NO GROWTH < 12 HOURS Performed at [facility], [address] [place], [place] [address]    Report Status PENDING   Blood culture (routine x 2)   Specimen: Right Antecubital; Blood  Result Value Ref Range   Specimen Description RIGHT ANTECUBITAL    Special Requests      BOTTLES DRAWN AEROBIC AND ANAEROBIC Blood Culture adequate volume   Culture      NO GROWTH < 12 HOURS Performed at [facility], [address] [place], [place] [address]    Report Status PENDING   Resp Panel by RT-PCR (Flu A&B, Covid) Nasopharyngeal Swab   Specimen: Nasopharyngeal Swab; Nasopharyngeal(NP) swabs in vial transport medium  Result Value Ref Range   SARS Coronavirus 2 by RT PCR NEGATIVE NEGATIVE   Influenza A by PCR NEGATIVE NEGATIVE   Influenza B by PCR NEGATIVE NEGATIVE  CBC with Differential  Result Value Ref Range   WBC 15.2 (H) 4.0 - 10.5 K/uL   RBC 3.36 (L) 3.87 - 5.11 MIL/uL   Hemoglobin 9.6 (L) 12.0 - 15.0 g/dL   HCT 32.5 (L) 36.0 - 46.0 %   MCV 96.7 80.0 - 100.0 fL   MCH 28.6 26.0 - 34.0 pg   MCHC 29.5 (L) 30.0 - 36.0 g/dL   RDW 15.2 11.5 - 15.5 %   Platelets 536 (H) 150 - 400 K/uL   nRBC 0.3 (H) 0.0 - 0.2 %   Neutrophils Relative % 80 %   Neutro Abs 12.2 (H) 1.7 - 7.7 K/uL   Lymphocytes Relative 11 %   Lymphs Abs 1.6 0.7 - 4.0 K/uL   Monocytes Relative 6 %    Monocytes Absolute 0.9 0.1 - 1.0 K/uL   Eosinophils Relative 1 %   Eosinophils Absolute 0.2 0.0 - 0.5 K/uL   Basophils Relative 1 %   Basophils Absolute 0.1 0.0 - 0.1 K/uL   Immature Granulocytes 1 %   Abs Immature Granulocytes 0.20 (H) 0.00 - 0.07 K/uL  Comprehensive metabolic panel  Result Value Ref Range   Sodium 142 135 - 145 mmol/L   Potassium 3.6 3.5 - 5.1 mmol/L   Chloride 106 98 - 111 mmol/L   CO2 26 22 - 32 mmol/L   Glucose, Bld 191 (H) 70 - 99 mg/dL   BUN 19 6 - 20 mg/dL   Creatinine, Ser 1.64 (H) 0.44 - 1.00 mg/dL   Calcium 8.8 (L) 8.9 - 10.3 mg/dL   Total Protein 7.0 6.5 - 8.1 g/dL   Albumin 1.8 (L) 3.5 - 5.0 g/dL   AST 10 (L) 15 - 41 U/L   ALT 9 0 - 44 U/L   Alkaline Phosphatase 69 38 - 126 U/L   Total Bilirubin  0.7 0.3 - 1.2 mg/dL   GFR, Estimated 37 (L) >60 mL/min   Anion gap 10 5 - 15  Urinalysis, Routine w reflex microscopic Urine, Clean Catch  Result Value Ref Range   Color, Urine STRAW (A) YELLOW   APPearance CLEAR CLEAR   Specific Gravity, Urine 1.008 1.005 - 1.030   pH 6.0 5.0 - 8.0   Glucose, UA 50 (A) NEGATIVE mg/dL   Hgb urine dipstick NEGATIVE NEGATIVE   Bilirubin Urine NEGATIVE NEGATIVE   Ketones, ur NEGATIVE NEGATIVE mg/dL   Protein, ur 100 (A) NEGATIVE mg/dL   Nitrite NEGATIVE NEGATIVE   Leukocytes,Ua SMALL (A) NEGATIVE   RBC / HPF 0-5 0 - 5 RBC/hpf   WBC, UA 11-20 0 - 5 WBC/hpf   Bacteria, UA RARE (A) NONE SEEN   Squamous Epithelial / LPF 0-5 0 - 5  Lactic acid, plasma  Result Value Ref Range   Lactic Acid, Venous 0.8 0.5 - 1.9 mmol/L  Comprehensive metabolic panel  Result Value Ref Range   Sodium 141 135 - 145 mmol/L   Potassium 3.6 3.5 - 5.1 mmol/L   Chloride 109 98 - 111 mmol/L   CO2 24 22 - 32 mmol/L   Glucose, Bld 176 (H) 70 - 99 mg/dL   BUN 17 6 - 20 mg/dL   Creatinine, Ser 1.45 (H) 0.44 - 1.00 mg/dL   Calcium 8.5 (L) 8.9 - 10.3 mg/dL   Total Protein 6.2 (L) 6.5 - 8.1 g/dL   Albumin 1.8 (L) 3.5 - 5.0 g/dL   AST 8 (L) 15 -  41 U/L   ALT 9 0 - 44 U/L   Alkaline Phosphatase 62 38 - 126 U/L   Total Bilirubin 0.5 0.3 - 1.2 mg/dL   GFR, Estimated 42 (L) >60 mL/min   Anion gap 8 5 - 15  Magnesium  Result Value Ref Range   Magnesium 1.7 1.7 - 2.4 mg/dL  CBC  Result Value Ref Range   WBC 11.5 (H) 4.0 - 10.5 K/uL   RBC 3.31 (L) 3.87 - 5.11 MIL/uL   Hemoglobin 9.2 (L) 12.0 - 15.0 g/dL   HCT 31.9 (L) 36.0 - 46.0 %   MCV 96.4 80.0 - 100.0 fL   MCH 27.8 26.0 - 34.0 pg   MCHC 28.8 (L) 30.0 - 36.0 g/dL   RDW 15.2 11.5 - 15.5 %   Platelets 535 (H) 150 - 400 K/uL   nRBC 0.5 (H) 0.0 - 0.2 %  Glucose, capillary  Result Value Ref Range   Glucose-Capillary 185 (H) 70 - 99 mg/dL  Glucose, capillary  Result Value Ref Range   Glucose-Capillary 170 (H) 70 - 99 mg/dL  Glucose, capillary  Result Value Ref Range   Glucose-Capillary 120 (H) 70 - 99 mg/dL   *Note: Due to a large number of results and/or encounters for the requested time period, some results have not been displayed. A complete set of results can be found in Results Review.     Murvin Natal, MD [facility]   04/01/2021  1:07 PM How to contact the [facility] Attending or Consulting provider [place] or covering provider during after hours [place], for this patient?  1. Check the care team in [facility] and look for a) attending/consulting TRH provider listed and b) the [facility] team listed 2. Log into [email or web address] and use [place]'s universal password to access. If you do not have the password, please contact the hospital operator. 3. Locate the [facility] provider you are  looking for under [facility] and page to a number that you can be directly reached. 4. If you still have difficulty reaching the provider, please page the [facility] (Director on Call) for the Hospitalists listed on amion for assistance.

## 2021-04-02 NOTE — H&P (Signed)
TRH H&P    Patient Demographics:    [name], is a 55 y.o. female  MRN: [id number]  DOB - 16-Sep-1966  Admit Date - 04/01/2021  Referring MD/NP/PA: Ileene Patrick  Outpatient Primary MD for the patient is Caprice Renshaw, MD  Patient coming from: [place]  Chief complaint- Fall   HPI:    [name]  is a 55 y.o. female, with history of tobacco abuse, paroxysmal atrial fibrillation, hyperlipidemia, hypertension, diabetes mellitus type 2, peripheral vascular disease, CHF, CKD, and more presents the ED with a chief complaint of fall.  Patient reports that a staff member at [name] was pushing patient in a wheelchair when the wheelchair got caught on something and the whole wheelchair flipped over docking patient out.  She reports she had pain in her lower abdomen and hip after the fall.  She does not think she hit her head.  Patient is on Eliquis for paroxysmal atrial fibrillation.  So she came into the ED.  In the days leading up to this fall she reports that she has felt abdominal pain in her lower abdomen.  It started 3 days ago.  She reports she has been eating normally, had no nausea or vomiting.  She has had no change in her bowel habits.  She denies any dysuria.  Patient also denies dyspnea and cough.  She does wear 2 L oxygen at baseline.  Patient has sores on her legs that she reports started weeping more and being more painful 3 days ago.  She reports clear drainage, no purulence, no bleeding.  She reports generalized weakness and fatigue as well.  Patient has no other complaints at this time.  Patient smokes, does not drink alcohol, does not use illicit drugs.  She is vaccinated for COVID.  Patient is full code.  In the ED Temp 98.4, heart rate 95-115, respiratory rate 16-28, blood pressure 140/95, satting 100% on 2 L UA is borderline, urine culture pending Blood culture pending X-rays of left and right  inguinal do not show acute fracture or dislocation X-ray left hip shows no visible fracture or dislocation EKG shows an irregular sinus tachycardia 116, QTc 424 on the monitor patient appears to be in A. Fib Rate blood cell count 15.2, hemoglobin 9.6 Chemistry panel reveals an elevated creatinine that is at baseline 1.64, glucose 191 CT abdomen pelvis shows dilated gallbladder with sludge or noncalcified stones-no gallbladder wall thickening or pericholecystic fluid CT head shows no acute findings Patient was given 0.5 mg of Dilaudid x2, morphine 4 mg 1.5 L bolus given in the ED -no further fluids given as patient is fluid restricted with a history of CHF Vancomycin started for cellulitis    Review of systems:    In addition to the HPI above,  No Fever-chills, No Headache, No changes with Vision or hearing, No problems swallowing food or Liquids, No Chest pain, Cough or Shortness of Breath, Admits to lower abdominal pain, no nausea, no vomiting, no change in bowel habits No Blood in stool or Urine, No dysuria, Admits to hip  pain following fall No new weakness, tingling, numbness in any extremity, No recent weight gain or loss, No polyuria, polydypsia or polyphagia, No significant Mental Stressors.  All other systems reviewed and are negative.    Past History of the following :    Past Medical History:  Diagnosis Date  . Alcohol use   . Ankle fracture, lateral malleolus, closed 2013  . Anxiety   . Aortic atherosclerosis ([place]) 03/10/2021  . Breast mass, left 2013  . CHF (congestive heart failure) ([place])    a. EF 20-25% by echo in 05/2016 with cath showing normal cors b. EF 50-55% in 07/2020 c. 01/2021: EF at 55-60% with moderate LVH  . Chronic anemia   . CKD (chronic kidney disease), stage III ([place])   . Cocaine abuse ([place])   . COPD (chronic obstructive pulmonary disease) ([place])   . Diabetes mellitus, type 2 ([place])   . Diabetic Charcot foot ([place])   . Essential hypertension    . History of cardiomyopathy   . History of GI bleed 2011  . Hyperlipidemia   . Noncompliance   . Obesity   . PAF (paroxysmal atrial fibrillation) ([place])   . Panic attacks   . PAT (paroxysmal atrial tachycardia) ([facility])    Previously on Amiodarone  . Seizures ([place]) 10/01/2020  . Sleep apnea    Not on CPAP  . Stroke ([place]) 2018  . Tobacco abuse   . Urinary incontinence       Past Surgical History:  Procedure Laterality Date  . BIOPSY  11/12/2020   Procedure: BIOPSY;  Surgeon: Harvel Quale, MD;  [facility];  Service: Gastroenterology;;  . BREAST BIOPSY    . CARDIAC CATHETERIZATION N/A 07/28/2016   Procedure: Left Heart Cath and Coronary Angiography;  Surgeon: Jettie Booze, MD;  Location: [facility];  Service: Cardiovascular;  Laterality: N/A;  . COLONOSCOPY N/A 05/10/2019   Procedure: COLONOSCOPY;  Surgeon: Danie Binder, MD;  [facility];  Service: Endoscopy;  Laterality: N/A;  Phenergan 12.5 mg IV in pre-op  . DILATION AND CURETTAGE OF UTERUS    . ESOPHAGOGASTRODUODENOSCOPY (EGD) WITH PROPOFOL N/A 11/12/2020   Procedure: ESOPHAGOGASTRODUODENOSCOPY (EGD) WITH PROPOFOL;  Surgeon: Harvel Quale, MD;  [facility];  Service: Gastroenterology;  Laterality: N/A;  . I & D EXTREMITY Bilateral 09/22/2017   Procedure: BILATERAL DEBRIDEMENT LEG/FOOT ULCERS, APPLY VERAFLO WOUND VAC;  Surgeon: Newt Minion, MD;  Location: [facility];  Service: Orthopedics;  Laterality: Bilateral;  . I & D EXTREMITY Right 10/11/2018   Procedure: IRRIGATION AND DEBRIDEMENT RIGHT HAND;  Surgeon: [name], MD;  Location: [facility];  Service: Orthopedics;  Laterality: Right;  . I & D EXTREMITY Right 10/13/2018   Procedure: REPEAT IRRIGATION AND DEBRIDEMENT RIGHT HAND;  Surgeon: [name], MD;  Location: [facility];  Service: Orthopedics;  Laterality: Right;  . I & D EXTREMITY Right 11/22/2018   Procedure: IRRIGATION AND DEBRIDEMENT AND PINNING  RIGHT HAND;  Surgeon: [name], MD;  Location: [facility];  Service: Orthopedics;  Laterality: Right;  . IR RADIOLOGIST EVAL & MGMT  07/05/2018  . POLYPECTOMY  05/10/2019   Procedure: POLYPECTOMY;  Surgeon: Danie Binder, MD;  [facility];  Service: Endoscopy;;  . SKIN SPLIT GRAFT Bilateral 09/28/2017   Procedure: BILATERAL SPLIT THICKNESS SKIN GRAFT LEGS/FEET AND APPLY VAC;  Surgeon: Newt Minion, MD;  Location: [facility];  Service: Orthopedics;  Laterality: Bilateral;  . SKIN SPLIT GRAFT  Right 11/22/2018   Procedure: SKIN GRAFT SPLIT THICKNESS;  Surgeon: [name], MD;  Location: [facility];  Service: Orthopedics;  Laterality: Right;      Social History:      Social History   Tobacco Use  . Smoking status: Current Some Day Smoker    Packs/day: 0.25    Years: 20.00    Pack years: 5.00    Types: Cigarettes    Last attempt to quit: 06/03/2017    Years since quitting: 3.8  . Smokeless tobacco: Never Used  Substance Use Topics  . Alcohol use: Not Currently    Alcohol/week: 0.0 standard drinks    Comment: Quit in 2017       Family History :     Family History  Problem Relation Age of Onset  . Hypertension Mother   . Heart attack Mother   . Hypertension Father        CABG   . Hypertension Sister   . Hypertension Brother   . Hypertension Sister   . Cancer Sister        breast   . Arthritis Other   . Cancer Other   . Diabetes Other   . Asthma Other   . Hypertension Daughter   . Hypertension Son       Home Medications:   Prior to Admission medications   Medication Sig Start Date End Date Taking? Authorizing Provider  acetaminophen (TYLENOL) 325 MG tablet Take 2 tablets (650 mg total) by mouth every 6 (six) hours as needed for headache, fever or mild pain. 11/04/20   Johnson, Clanford L, MD  albuterol (PROAIR HFA) 108 (90 Base) MCG/ACT inhaler INHALE 2 PUFFS EVERY 6 HOURS AS NEEDED FOR SHORTNESS OF BREATH/WHEEZING. Patient taking differently: Inhale 2  puffs into the lungs every 6 (six) hours as needed for wheezing or shortness of breath. 08/05/20   [name], MD  amLODipine (NORVASC) 10 MG tablet Take 1 tablet (10 mg total) by mouth daily. 12/07/20   Roxan Hockey, MD  apixaban (ELIQUIS) 5 MG TABS tablet Take 1 tablet (5 mg total) by mouth 2 (two) times daily. Okay to restart Eliquis on 11/17/2020 03/27/21   Deatra [name], MD  DULoxetine (CYMBALTA) 60 MG capsule Take 1 capsule (60 mg total) by mouth daily. 12/07/20   Roxan Hockey, MD  furosemide (LASIX) 40 MG tablet Take 0.5 tablets (20 mg total) by mouth every Tuesday, Thursday, Saturday, and Sunday. 03/27/21   Shahmehdi, Valeria Batman, MD  HUMALOG KWIKPEN 100 UNIT/ML KwikPen Inject into the skin. 03/28/21   [provider]  insulin aspart (NOVOLOG) 100 UNIT/ML injection Inject 0-15 Units into the skin 3 (three) times daily with meals. 03/27/21   Shahmehdi, Valeria Batman, MD  levETIRAcetam (KEPPRA) 750 MG tablet Take 1 tablet (750 mg total) by mouth 2 (two) times daily. 01/30/21   Suzzanne Cloud, NP  linagliptin (TRADJENTA) 5 MG TABS tablet Take 5 mg by mouth daily.    [provider]  LORazepam (ATIVAN) 0.5 MG tablet Take 0.5 mg by mouth 3 (three) times daily as needed. 03/31/21   [provider]  metoprolol tartrate (LOPRESSOR) 25 MG tablet Take 1 tablet (25 mg total) by mouth 2 (two) times daily. 03/27/21   Shahmehdi, Valeria Batman, MD  omeprazole (PRILOSEC) 20 MG capsule Take 20 mg by mouth daily.    [provider]  oxybutynin (DITROPAN-XL) 5 MG 24 hr tablet Take 5 mg by mouth at bedtime.    [provider]  oxyCODONE (OXY IR/ROXICODONE) 5 MG immediate release tablet Take by mouth. 03/31/21   [provider]  SSD 1 % cream Apply topically. 03/31/21   [provider]  tiotropium (SPIRIVA HANDIHALER) 18 MCG inhalation capsule Place 18 mcg into inhaler and inhale daily as needed (Shortness of breath).    [provider]  traZODone (DESYREL)  50 MG tablet Take 1 tablet (50 mg total) by mouth at bedtime. Patient not taking: Reported on 03/24/2021 12/07/20   Roxan Hockey, MD  TRULICITY 3 ZT/2.4PY SOPN Inject 3 mg into the skin See admin instructions. Inject 0.5 mls subcutaneously one time a day every Friday 03/11/21   [provider]  vitamin C (ASCORBIC ACID) 500 MG tablet Take 500 mg by mouth daily.    [provider]  Zinc Oxide 10 % AERO Apply 1 application topically daily.    [provider]  zinc sulfate 220 (50 Zn) MG capsule Take 1 capsule (220 mg total) by mouth daily. 12/08/20   Roxan Hockey, MD     Allergies:    No Known Allergies   Physical Exam:   Vitals  Blood pressure (!) 148/96, pulse (!) 102, temperature (!) 97.5 F (36.4 C), temperature source Oral, resp. rate 16, height 5\' 1"  (1.549 m), weight 83.7 kg, last menstrual period 05/19/2016, SpO2 92 %.  1.  General: Patient lying supine in bed,  no acute distress   2. Psychiatric: Alert and oriented x 3, mood and behavior normal for situation,  cooperative with exam   3. Neurologic: Speech and language are normal, face is symmetric, moves all 4 extremities voluntarily, at baseline without acute deficits on limited exam   4. HEENMT:  Head is atraumatic, normocephalic, pupils reactive to light, neck is supple, trachea is midline, mucous membranes are moist   5. Respiratory : Lungs are clear to auscultation bilaterally without wheezing, rhonchi, rales, no cyanosis, no increase in work of breathing or accessory muscle use   6. Cardiovascular : Heart rate tachycardic, rhythm is irregular, no rubs or gallops, no peripheral edema, peripheral pulses palpated   7. Gastrointestinal:  Abdomen is soft, nondistended, nontender to palpation bowel sounds active, no masses or organomegaly palpated   8. Skin:  Skin is warm and dry, Ulcers on the medial aspects of lower extremities BL. The larger ulcer is on the left lower extremity which  is also more tender to palpation. The right lower extremity has a small ulcer with surrounding erythema that is also tender to palpation, but not as tender as the left   9.Musculoskeletal:  Loss of muscle tone symmetrically in the lower extremities BL, ulcers as described above, no peripheral edema, peripheral pulses palpated    Data Review:    CBC Recent Labs  Lab 03/27/21 0707 04/01/21 1341  WBC 9.8 15.2*  HGB 9.2* 9.6*  HCT 32.0* 32.5*  PLT 470* 536*  MCV 97.9 96.7  MCH 28.1 28.6  MCHC 28.8* 29.5*  RDW 15.1 15.2  LYMPHSABS  --  1.6  MONOABS  --  0.9  EOSABS  --  0.2  BASOSABS  --  0.1   ------------------------------------------------------------------------------------------------------------------  Results for orders placed or performed during the hospital encounter of 04/01/21 (from the past 48 hour(s))  CBC with Differential     Status: Abnormal   Collection Time: 04/01/21  1:41 PM  Result Value Ref Range   WBC 15.2 (H) 4.0 - 10.5 K/uL   RBC 3.36 (L) 3.87 - 5.11 MIL/uL  Hemoglobin 9.6 (L) 12.0 - 15.0 g/dL   HCT 32.5 (L) 36.0 - 46.0 %   MCV 96.7 80.0 - 100.0 fL   MCH 28.6 26.0 - 34.0 pg   MCHC 29.5 (L) 30.0 - 36.0 g/dL   RDW 15.2 11.5 - 15.5 %   Platelets 536 (H) 150 - 400 K/uL   nRBC 0.3 (H) 0.0 - 0.2 %   Neutrophils Relative % 80 %   Neutro Abs 12.2 (H) 1.7 - 7.7 K/uL   Lymphocytes Relative 11 %   Lymphs Abs 1.6 0.7 - 4.0 K/uL   Monocytes Relative 6 %   Monocytes Absolute 0.9 0.1 - 1.0 K/uL   Eosinophils Relative 1 %   Eosinophils Absolute 0.2 0.0 - 0.5 K/uL   Basophils Relative 1 %   Basophils Absolute 0.1 0.0 - 0.1 K/uL   Immature Granulocytes 1 %   Abs Immature Granulocytes 0.20 (H) 0.00 - 0.07 K/uL    Comment: Performed at [facility], [address], [place], [place] [address]  Comprehensive metabolic panel     Status: Abnormal   Collection Time: 04/01/21  1:41 PM  Result Value Ref Range   Sodium 142 135 - 145 mmol/L   Potassium 3.6 3.5 - 5.1  mmol/L   Chloride 106 98 - 111 mmol/L   CO2 26 22 - 32 mmol/L   Glucose, Bld 191 (H) 70 - 99 mg/dL    Comment: Glucose reference range applies only to samples taken after fasting for at least 8 hours.   BUN 19 6 - 20 mg/dL   Creatinine, Ser 1.64 (H) 0.44 - 1.00 mg/dL   Calcium 8.8 (L) 8.9 - 10.3 mg/dL   Total Protein 7.0 6.5 - 8.1 g/dL   Albumin 1.8 (L) 3.5 - 5.0 g/dL   AST 10 (L) 15 - 41 U/L   ALT 9 0 - 44 U/L   Alkaline Phosphatase 69 38 - 126 U/L   Total Bilirubin 0.7 0.3 - 1.2 mg/dL   GFR, Estimated 37 (L) >60 mL/min    Comment: (NOTE) Calculated using the CKD-EPI Creatinine Equation (2021)    Anion gap 10 5 - 15    Comment: Performed at [facility], [address], [place], [place] [address]  Urinalysis, Routine w reflex microscopic Urine, Clean Catch     Status: Abnormal   Collection Time: 04/01/21  5:32 PM  Result Value Ref Range   Color, Urine STRAW (A) YELLOW   APPearance CLEAR CLEAR   Specific Gravity, Urine 1.008 1.005 - 1.030   pH 6.0 5.0 - 8.0   Glucose, UA 50 (A) NEGATIVE mg/dL   Hgb urine dipstick NEGATIVE NEGATIVE   Bilirubin Urine NEGATIVE NEGATIVE   Ketones, ur NEGATIVE NEGATIVE mg/dL   Protein, ur 100 (A) NEGATIVE mg/dL   Nitrite NEGATIVE NEGATIVE   Leukocytes,Ua SMALL (A) NEGATIVE   RBC / HPF 0-5 0 - 5 RBC/hpf   WBC, UA 11-20 0 - 5 WBC/hpf   Bacteria, UA RARE (A) NONE SEEN   Squamous Epithelial / LPF 0-5 0 - 5    Comment: Performed at [facility], [address] [place], [place] [address]  Resp Panel by RT-PCR (Flu A&B, Covid) Nasopharyngeal Swab     Status: None   Collection Time: 04/01/21  7:45 PM   Specimen: Nasopharyngeal Swab; Nasopharyngeal(NP) swabs in vial transport medium  Result Value Ref Range   SARS Coronavirus 2 by RT PCR NEGATIVE NEGATIVE    Comment: (NOTE) SARS-CoV-2 target nucleic acids are  NOT DETECTED.  The SARS-CoV-2 RNA is generally detectable in upper respiratory specimens during the acute phase of infection. The  lowest concentration of SARS-CoV-2 viral copies this assay can detect is 138 copies/mL. A negative result does not preclude SARS-Cov-2 infection and should not be used as the sole basis for treatment or other patient management decisions. A negative result may occur with  improper specimen collection/handling, submission of specimen other than nasopharyngeal swab, presence of viral mutation(s) within the areas targeted by this assay, and inadequate number of viral copies(<138 copies/mL). A negative result must be combined with clinical observations, patient history, and epidemiological information. The expected result is Negative.  Fact Sheet for Patients:  EntrepreneurPulse.com.au  Fact Sheet for Healthcare Providers:  IncredibleEmployment.be  This test is no t yet approved or cleared by the [place] FDA and  has been authorized for detection and/or diagnosis of SARS-CoV-2 by FDA under an Emergency Use Authorization (EUA). This EUA will remain  in effect (meaning this test can be used) for the duration of the COVID-19 declaration under Section 564(b)(1) of the Act, 21 U.S.C.section 360bbb-3(b)(1), unless the authorization is terminated  or revoked sooner.       Influenza A by PCR NEGATIVE NEGATIVE   Influenza B by PCR NEGATIVE NEGATIVE    Comment: (NOTE) The Xpert Xpress SARS-CoV-2/FLU/RSV plus assay is intended as an aid in the diagnosis of influenza from Nasopharyngeal swab specimens and should not be used as a sole basis for treatment. Nasal washings and aspirates are unacceptable for Xpert Xpress SARS-CoV-2/FLU/RSV testing.  Fact Sheet for Patients: EntrepreneurPulse.com.au  Fact Sheet for Healthcare Providers: IncredibleEmployment.be  This test is not yet approved or cleared by the [place] FDA and has been authorized for detection and/or diagnosis of SARS-CoV-2 by FDA under an Emergency  Use Authorization (EUA). This EUA will remain in effect (meaning this test can be used) for the duration of the COVID-19 declaration under Section 564(b)(1) of the Act, 21 U.S.C. section 360bbb-3(b)(1), unless the authorization is terminated or revoked.  Performed at [facility], [address] [place], [place] [address]   Blood culture (routine x 2)     Status: None (Preliminary result)   Collection Time: 04/01/21  8:53 PM   Specimen: Right Antecubital; Blood  Result Value Ref Range   Specimen Description RIGHT ANTECUBITAL    Special Requests      BOTTLES DRAWN AEROBIC AND ANAEROBIC Blood Culture adequate volume Performed at [facility], [address] [place], [place] [address]    Culture PENDING    Report Status PENDING   Lactic acid, plasma     Status: None   Collection Time: 04/01/21  8:55 PM  Result Value Ref Range   Lactic Acid, Venous 0.8 0.5 - 1.9 mmol/L    Comment: Performed at [facility], [address], [place], [place] [address]  Glucose, capillary     Status: Abnormal   Collection Time: 04/01/21 10:56 PM  Result Value Ref Range   Glucose-Capillary 185 (H) 70 - 99 mg/dL    Comment: Glucose reference range applies only to samples taken after fasting for at least 8 hours.   *Note: Due to a large number of results and/or encounters for the requested time period, some results have not been displayed. A complete set of results can be found in Results Review.    Chemistries  Recent Labs  Lab 03/27/21 0707 04/01/21 1341  NA 140 142  K 4.5 3.6  CL 106 106  CO2  21* 26  GLUCOSE 143* 191*  BUN 27* 19  CREATININE 2.04* 1.64*  CALCIUM 8.8* 8.8*  AST  --  10*  ALT  --  9  ALKPHOS  --  69  BILITOT  --  0.7   ------------------------------------------------------------------------------------------------------------------  ------------------------------------------------------------------------------------------------------------------ GFR: Estimated Creatinine  Clearance: 37.6 mL/min (A) (by C-G formula based on SCr of 1.64 mg/dL (H)). Liver Function Tests: Recent Labs  Lab 04/01/21 1341  AST 10*  ALT 9  ALKPHOS 69  BILITOT 0.7  PROT 7.0  ALBUMIN 1.8*   No results for input(s): LIPASE, AMYLASE in the last 168 hours. No results for input(s): AMMONIA in the last 168 hours. Coagulation Profile: No results for input(s): INR, PROTIME in the last 168 hours. Cardiac Enzymes: No results for input(s): CKTOTAL, CKMB, CKMBINDEX, TROPONINI in the last 168 hours. BNP (last 3 results) No results for input(s): PROBNP in the last 8760 hours. HbA1C: No results for input(s): HGBA1C in the last 72 hours. CBG: Recent Labs  Lab 03/26/21 2222 03/27/21 0816 03/27/21 1119 03/27/21 1639 04/01/21 2256  GLUCAP 93 134* 155* 156* 185*   Lipid Profile: No results for input(s): CHOL, HDL, LDLCALC, TRIG, CHOLHDL, LDLDIRECT in the last 72 hours. Thyroid Function Tests: No results for input(s): TSH, T4TOTAL, FREET4, T3FREE, THYROIDAB in the last 72 hours. Anemia Panel: No results for input(s): VITAMINB12, FOLATE, FERRITIN, TIBC, IRON, RETICCTPCT in the last 72 hours.  --------------------------------------------------------------------------------------------------------------- Urine analysis:    Component Value Date/Time   COLORURINE STRAW (A) 04/01/2021 1732   APPEARANCEUR CLEAR 04/01/2021 1732   LABSPEC 1.008 04/01/2021 1732   PHURINE 6.0 04/01/2021 1732   GLUCOSEU 50 (A) 04/01/2021 1732   HGBUR NEGATIVE 04/01/2021 1732   BILIRUBINUR NEGATIVE 04/01/2021 1732   KETONESUR NEGATIVE 04/01/2021 1732   PROTEINUR 100 (A) 04/01/2021 1732   UROBILINOGEN 0.2 09/11/2014 1319   NITRITE NEGATIVE 04/01/2021 1732   LEUKOCYTESUR SMALL (A) 04/01/2021 1732      Imaging Results:    CT Abdomen Pelvis Wo Contrast  Result Date: 04/01/2021 CLINICAL DATA:  Abdominal pain following a fall yesterday. EXAM: CT ABDOMEN AND PELVIS WITHOUT CONTRAST TECHNIQUE: Multidetector  CT imaging of the abdomen and pelvis was performed following the standard protocol without IV contrast. COMPARISON:  10/29/2020 FINDINGS: Lower chest: Stable enlarged heart. Resolved small bilateral pleural effusions. Minimal bibasilar atelectasis with improvement. Hepatobiliary: Dilated gallbladder containing sludge or noncalcified gallstones. No gallbladder wall thickening or pericholecystic fluid. Pancreas: Unremarkable. No pancreatic ductal dilatation or surrounding inflammatory changes. Spleen: Normal in size without focal abnormality. Adrenals/Urinary Tract: Stable mild diffuse bilateral adrenal enlargement compatible with mild hyperplasia. Unremarkable kidneys, ureters and urinary bladder. Increased bilateral perinephric soft tissue stranding and fluid. Stomach/Bowel: Stomach is within normal limits. Appendix appears normal. No evidence of bowel wall thickening, distention, or inflammatory changes. Vascular/Lymphatic: Atheromatous arterial calcifications without aneurysm. No enlarged lymph nodes. Reproductive: Uterus and bilateral adnexa are unremarkable. Other: No abdominal wall hernia or abnormality. No abdominopelvic ascites. Musculoskeletal: Stable L1 vertebral superior endplate compression deformity with no acute fracture lines and minimal bony retropulsion. Stable grade 1 anterolisthesis at the L3-4 level. Multilevel lumbar and lower thoracic spine degenerative changes. IMPRESSION: 1. No acute abnormality. 2. Dilated gallbladder containing sludge or noncalcified gallstones. 3. Stable cardiomegaly. Electronically Signed   By: Claudie Revering M.D.   On: 04/01/2021 15:55   DG Ankle Complete Left  Result Date: 04/01/2021 CLINICAL DATA:  Right ankle pain following a fall yesterday. History of diabetic Charcot foot. EXAM: LEFT ANKLE COMPLETE - 3+  VIEW COMPARISON:  None. FINDINGS: Old, healed proximal lateral malleolus fracture. No acute fracture or dislocation seen. Diffuse osteopenia. Flattening of the talar  dome and talotibial joint. Marked pes planus lateral subluxation of the metatarsals relative to the tarsals. IMPRESSION: 1. No acute fracture or dislocation. 2. Charcot changes of the foot with marked pes planus. Electronically Signed   By: Claudie Revering M.D.   On: 04/01/2021 16:04   DG Ankle Complete Right  Result Date: 04/01/2021 CLINICAL DATA:  Right yesterday.  Ankle pain after falling EXAM: RIGHT ANKLE - COMPLETE 3+ VIEW COMPARISON:  Right foot dated 08/02/2017 FINDINGS: Soft tissue irregularity in the lateral aspect of the distal lower leg. Mild talotibial degenerative changes. Marked pes planus. Moderate-sized inferior calcaneal enthesophyte. Lateral subluxation of the metatarsals relative to the tarsals. No fracture or dislocation seen. IMPRESSION: 1. No fracture or dislocation. 2. Charcot foot with marked pes planus. Electronically Signed   By: Claudie Revering M.D.   On: 04/01/2021 16:06   CT Head Wo Contrast  Result Date: 04/01/2021 CLINICAL DATA:  Head injury after fall. EXAM: CT HEAD WITHOUT CONTRAST TECHNIQUE: Contiguous axial images were obtained from the base of the skull through the vertex without intravenous contrast. COMPARISON:  Mar 22, 2021. FINDINGS: Brain: Old left cerebellar infarction is noted. No mass effect or midline shift is noted. Ventricular size is within normal limits. There is no evidence of mass lesion, hemorrhage or acute infarction. Vascular: No hyperdense vessel or unexpected calcification. Skull: Normal. Negative for fracture or focal lesion. Sinuses/Orbits: No acute finding. Other: None. IMPRESSION: No acute intracranial abnormality seen. Electronically Signed   By: Marijo Conception M.D.   On: 04/01/2021 15:51   DG Hip Unilat With Pelvis 2-3 Views Left  Result Date: 04/01/2021 CLINICAL DATA:  Left hip pain following a fall yesterday. EXAM: DG HIP (WITH OR WITHOUT PELVIS) 2-3V LEFT COMPARISON:  None. FINDINGS: Poorly visualized bones due to the thickness of the overlying  soft tissues. No visible fracture or dislocation. Atheromatous arterial calcifications. IMPRESSION: No visible fracture or dislocation. Electronically Signed   By: Claudie Revering M.D.   On: 04/01/2021 16:08    My personal review of EKG: Rhythm Irregular HR 116 /min, QTc424,no Acute ST changes   Assessment & Plan:    Principal Problem:   Cellulitis Active Problems:   HTN (hypertension), malignant   Type 2 diabetes mellitus with vascular disease (HCC)   Protein-calorie malnutrition, severe (HCC)   Chronic respiratory failure with hypoxia ([place])   1. Cellulitis 1. WBC 15.2 2. Infected PVD ulcer 3. Continue Vancomycin 2. Leukocytosis 1. WBC 15.2 2. Patient is also tachycardic - meeting SIRS criteria, but since she is tachycardic at baseline, and lactic acid and other vitals are normal - do not suspect sepsis 3. CT abd/Pelvis shows dilated gall bladder with sludge or non calcified stones - no RUQ tenderness on exam.  4. Most likely 2/2 cellulitis 3. Fall 1. CT head - no acute findings 2. Mechanical fall - flipped over wheelchair 3. Consult PT 4. Xrays BL ankles and hip show no acute findings 4. Severe protein cal malnutrition 1. Albumin down to 1.8 2. Re-start glucerna shakes 3. Continue to monitor 5. Chronic resp fail 1. Continue baseline O2 requirement 2L [place] 6. DMII 1. Last Hgb A1C 11.1 2. Continue to monitor CBGs 3. Continue sliding scale coverage 7. PAF 1. Continue Eliquis and metoprolol 8. HTN 1. Continue norvasc and lopressor 9. HLD 1. Continue statin 10. Tobacco use disorder 1.  Counseled on cessation   DVT Prophylaxis-   Eliquis - SCDs   AM Labs Ordered, also please review Full Orders  Family Communication: No family at bedside Code Status: Full Admission status: Inpatient :The appropriate admission status for this patient is INPATIENT. Inpatient status is judged to be reasonable and necessary in order to provide the required intensity of service to ensure the  patient's safety. The patient's presenting symptoms, physical exam findings, and initial radiographic and laboratory data in the context of their chronic comorbidities is felt to place them at high risk for further clinical deterioration. Furthermore, it is not anticipated that the patient will be medically stable for discharge from the hospital within 2 midnights of admission. The following factors support the admission status of inpatient.     The patient's presenting symptoms include fall The worrisome physical exam findings include erythema around PVD ulcer The initial radiographic and laboratory data are worrisome because of WBC 15.2 The chronic co-morbidities include HTN, HLD, PAF      * I certify that at the point of admission it is my clinical judgment that the patient will require inpatient hospital care spanning beyond 2 midnights from the point of admission due to high intensity of service, high risk for further deterioration and high frequency of surveillance required.*  Time spent in minutes : [place]

## 2021-04-03 LAB — CBC WITH DIFFERENTIAL/PLATELET
Abs Immature Granulocytes: 0.15 10*3/uL — ABNORMAL HIGH (ref 0.00–0.07)
Basophils Absolute: 0.1 10*3/uL (ref 0.0–0.1)
Basophils Relative: 0 %
Eosinophils Absolute: 0.1 10*3/uL (ref 0.0–0.5)
Eosinophils Relative: 1 %
HCT: 30.6 % — ABNORMAL LOW (ref 36.0–46.0)
Hemoglobin: 9.2 g/dL — ABNORMAL LOW (ref 12.0–15.0)
Immature Granulocytes: 1 %
Lymphocytes Relative: 11 %
Lymphs Abs: 1.4 10*3/uL (ref 0.7–4.0)
MCH: 28.8 pg (ref 26.0–34.0)
MCHC: 30.1 g/dL (ref 30.0–36.0)
MCV: 95.9 fL (ref 80.0–100.0)
Monocytes Absolute: 0.9 10*3/uL (ref 0.1–1.0)
Monocytes Relative: 7 %
Neutro Abs: 10.2 10*3/uL — ABNORMAL HIGH (ref 1.7–7.7)
Neutrophils Relative %: 80 %
Platelets: 529 10*3/uL — ABNORMAL HIGH (ref 150–400)
RBC: 3.19 MIL/uL — ABNORMAL LOW (ref 3.87–5.11)
RDW: 15.1 % (ref 11.5–15.5)
WBC: 12.8 10*3/uL — ABNORMAL HIGH (ref 4.0–10.5)
nRBC: 0.5 % — ABNORMAL HIGH (ref 0.0–0.2)

## 2021-04-03 LAB — COMPREHENSIVE METABOLIC PANEL
ALT: 8 U/L (ref 0–44)
AST: 9 U/L — ABNORMAL LOW (ref 15–41)
Albumin: 1.7 g/dL — ABNORMAL LOW (ref 3.5–5.0)
Alkaline Phosphatase: 63 U/L (ref 38–126)
Anion gap: 12 (ref 5–15)
BUN: 14 mg/dL (ref 6–20)
CO2: 24 mmol/L (ref 22–32)
Calcium: 8.6 mg/dL — ABNORMAL LOW (ref 8.9–10.3)
Chloride: 107 mmol/L (ref 98–111)
Creatinine, Ser: 1.44 mg/dL — ABNORMAL HIGH (ref 0.44–1.00)
GFR, Estimated: 43 mL/min — ABNORMAL LOW (ref >60)
Glucose, Bld: 171 mg/dL — ABNORMAL HIGH (ref 70–99)
Potassium: 3 mmol/L — ABNORMAL LOW (ref 3.5–5.1)
Sodium: 143 mmol/L (ref 135–145)
Total Bilirubin: 0.7 mg/dL (ref 0.3–1.2)
Total Protein: 6 g/dL — ABNORMAL LOW (ref 6.5–8.1)

## 2021-04-03 LAB — URINE CULTURE

## 2021-04-03 LAB — GLUCOSE, CAPILLARY
Glucose-Capillary: 168 mg/dL — ABNORMAL HIGH (ref 70–99)
Glucose-Capillary: 179 mg/dL — ABNORMAL HIGH (ref 70–99)

## 2021-04-03 MED ORDER — SILVER SULFADIAZINE 1 % EX CREA
TOPICAL_CREAM | Freq: Every day | CUTANEOUS | Status: DC
  Filled 2021-04-03: qty 85

## 2021-04-03 MED ORDER — POTASSIUM CHLORIDE CRYS ER 20 MEQ PO TBCR
60.0000 meq | EXTENDED_RELEASE_TABLET | Freq: Once | ORAL | Status: AC
  Administered 2021-04-03: 60 meq via ORAL
  Filled 2021-04-03: qty 3

## 2021-04-03 MED ORDER — METOPROLOL TARTRATE 50 MG PO TABS
50.0000 mg | ORAL_TABLET | Freq: Two times a day (BID) | ORAL | Status: DC
  Administered 2021-04-03: 50 mg via ORAL
  Filled 2021-04-03: qty 1

## 2021-04-03 MED ORDER — ROSUVASTATIN CALCIUM 10 MG PO TABS
10.0000 mg | ORAL_TABLET | Freq: Every evening | ORAL | Status: DC
  Administered 2021-04-03: 10 mg via ORAL
  Filled 2021-04-03: qty 1

## 2021-04-03 NOTE — Consult Note (Signed)
WOC Nurse Consult Note: Reason for Consult: Consulted for chronic nonhealing wounds to bilateral lower legs.  Orthopedic surgery has seen in the office 03/31/21 and has recommended urgent vascular referral.  I will implement topical wound care at this time.  Wound type:Dr Sharol Given has evaluated wounds and noted "patient has progressive gangrenous changes of both lower extremities she has a black ischemic ulceration of the dorsum of the right foot and essentially circumferential ischemic necrotic ulcers over both ankles.  Patient complains of pain.  She has pitting edema over both lower extremities with the gangrenous ulcers both over the medial and lateral malleolus bilaterally as well as a black gangrenous ulcer dorsally over the right foot.  There is pitting edema both lower extremities no ascending cellulitis.  Patient's most recent hemoglobin A1c is 11.1 and most recent albumin is 1.6."   Vascular consult recommended.  Pressure Injury POA: NA Measurement: see photos in chart Wound bed: black devitalized tissue Drainage (amount, consistency, odor) minimal serosanguinous  musty odor Periwound:edema and pain Dressing procedure/placement/frequency: Cleanse bilateral lower leg wounds with NS and pat dry  Apply silvadene cream to open wounds  Cover with telfa and kerlix/tape. Change daily.  Will not follow at this time.  Please re-consult if needed.  Domenic Moras MSN, RN, FNP-BC CWON Wound, Ostomy, Continence Nurse Pager [phone]

## 2021-04-03 NOTE — Progress Notes (Signed)
Pt started yelling "help" around 2145 tonight. This RN along with a LPN went into pt's room to see what she needed help with. Pt stated that there was a snake in  her covers. This RN then removed all covers from pt's bed. Pt asked this RN and other nurse to help her get OOB. This RN explained that we did not have a lift at that time to get her OOB. We did assist pt to a sitting position, where her legs were dangling off the side of the bed.  This RN then helped pt back in bed. New covers were given to pt.  Pt waited about 15 minutes until she started yelling "Help" again. This RN was in another pt's room when this started. Once able, this RN went back into pt's room. Pt again stated that a snake was in her bed. This RN asked pt where the snake was and offered to get it out of the bed. Pt pointed to the bottom of the bed. This RN then attempted to grab the  "snake" which mad pt angry. Pt then started yelling at this RN that she was "no fool" and "I know you are just grabbing air". Pt then asked for a phone to "call 911". This RN offered to call for her. This RN then called hospital security to come to the room. A security officer came to the room and talked with pt, looked around the room and assured pt there was no snakes in the room.  This RN asked pt if she would take her night medication. Pt refused all medication. Pt then stated that once she spoke with her brother she would have this RN fired.  This RN asked pt again if she would like her medication. Pt again refused.  This RN, along with security then left pt's room. This RN did close pt's door as multiple pt's on the floor have complained of pt's yelling and keeping them awake.  This RN gave pt her call bell before leaving the room.

## 2021-04-03 NOTE — Progress Notes (Addendum)
PROGRESS NOTE   [name]  KGU:[id number] DOB: [birth year] DOA: 04/01/2021 PCP: Caprice Renshaw, MD   Chief Complaint  Patient presents with   Fall   Level of care: Telemetry  Brief Admission History:  55 y.o. female, with history of tobacco abuse, paroxysmal atrial fibrillation, hyperlipidemia, hypertension, diabetes mellitus type 2, peripheral vascular disease, CHF, CKD, and more presents the ED with a chief complaint of fall.  Patient reports that a staff member at [facility] was pushing patient in a wheelchair when the wheelchair got caught on something and the whole wheelchair flipped over docking patient out.  Assessment & Plan:   Principal Problem:   Cellulitis Active Problems:   HTN (hypertension), malignant   Type 2 diabetes mellitus with vascular disease (HCC)   Protein-calorie malnutrition, severe (HCC)   Chronic respiratory failure with hypoxia (HCC)   Gallbladder sludge  Cellulitis right lower leg - continue antibiotics and wound care as ordered.   PAF -patient is rate controlled and fully anticoagulated with apixaban.  Continue metoprolol as ordered.  Essential hypertension-initially was poorly controlled but now has resumed home blood pressure medications and blood pressure is better controlled.  Type 2 diabetes mellitus, poorly controlled with neurological complications-peripheral neuropathy has been a significant issue.  A1c is 11.1% which is evidence of poorly controlled disease.  Continue current management and frequent CBG monitoring.  Chronic respiratory failure-patient is stable on regular 2 L nasal cannula.  Hypokalemia - oral replacement given, recheck in AM.   Fall -patient had a fall at the facility but this has been further worked up with imaging and there have been no findings of fractures.  Hip and ankle x-rays have been negative.  PT evaluation will be requested.  Leukocytosis - WBC trending down with treatment.   DVT prophylaxis: apixaban  Code  Status: Full  Family Communication: plan of care discussed with patient  Disposition: return to SNF  Status is: Inpatient  Remains inpatient appropriate because:Inpatient level of care appropriate due to severity of illness  Dispo: The patient is from: SNF              Anticipated d/c is to: SNF              Patient currently is not medically stable to d/c.   Difficult to place patient No    Consultants:    Procedures:    Antimicrobials:     Subjective: Pt had pulled out her IV and refused to have it replaced.  I spoke with her again and asked her to allow for IV to be placed so we can give medication.   Objective: Vitals:   04/02/21 2021 04/03/21 0410 04/03/21 0742 04/03/21 1316  BP: 115/63 (!) 137/91 (!) 143/87 111/90  Pulse: (!) 107 (!) 102 (!) 104 88  Resp: 17 18 18 18   Temp: 98.6 F (37 C) 99 F (37.2 C) 98.3 F (36.8 C) 98.5 F (36.9 C)  TempSrc: Oral Oral Oral Oral  SpO2:  93% 97% 100%  Weight:      Height:        Intake/Output Summary (Last 24 hours) at 04/03/2021 1633 Last data filed at 04/03/2021 1200 Gross per 24 hour  Intake --  Output 1650 ml  Net -1650 ml   Filed Weights   04/01/21 1313 04/01/21 2243  Weight: 83 kg 83.7 kg    Examination:  General exam: awake, alert, agitated at times, NAD. Appears calm and comfortable  Respiratory system: Clear to auscultation. Respiratory  effort normal. Cardiovascular system: normal S1 & S2 heard. No JVD, murmurs, rubs, gallops or clicks. No pedal edema. Gastrointestinal system: Abdomen is nondistended, soft and nontender. No organomegaly or masses felt. Normal bowel sounds heard. Central nervous system: Alert and oriented. No focal neurological deficits. Extremities: Symmetric 5 x 5 power. Skin: bilateral LE ulcerations.  Psychiatry: Judgement and insight appear poor. Mood & affect agitated.    Data Reviewed: I have personally reviewed following labs and imaging studies  CBC: Recent Labs  Lab  04/01/21 1341 04/02/21 0533 04/03/21 0558  WBC 15.2* 11.5* 12.8*  NEUTROABS 12.2*  --  10.2*  HGB 9.6* 9.2* 9.2*  HCT 32.5* 31.9* 30.6*  MCV 96.7 96.4 95.9  PLT 536* 535* 529*    Basic Metabolic Panel: Recent Labs  Lab 04/01/21 1341 04/02/21 0533 04/03/21 0558  NA 142 141 143  K 3.6 3.6 3.0*  CL 106 109 107  CO2 26 24 24   GLUCOSE 191* 176* 171*  BUN 19 17 14   CREATININE 1.64* 1.45* 1.44*  CALCIUM 8.8* 8.5* 8.6*  MG  --  1.7  --     GFR: Estimated Creatinine Clearance: 42.8 mL/min (A) (by C-G formula based on SCr of 1.44 mg/dL (H)).  Liver Function Tests: Recent Labs  Lab 04/01/21 1341 04/02/21 0533 04/03/21 0558  AST 10* 8* 9*  ALT 9 9 8   ALKPHOS 69 62 63  BILITOT 0.7 0.5 0.7  PROT 7.0 6.2* 6.0*  ALBUMIN 1.8* 1.8* 1.7*    CBG: Recent Labs  Lab 04/02/21 1053 04/02/21 1612 04/02/21 2124 04/03/21 0724 04/03/21 1128  GLUCAP 120* 148* 111* 168* 179*    Recent Results (from the past 240 hour(s))  Urine Culture     Status: Abnormal   Collection Time: 03/25/21 11:26 AM   Specimen: Urine, Clean Catch  Result Value Ref Range Status   Specimen Description   Final    URINE, CLEAN CATCH Performed at [facility], [address], [place], [place] [address]    Special Requests   Final    NONE Performed at [facility], [address], [place], [place] [address]    Culture MULTIPLE SPECIES PRESENT, SUGGEST RECOLLECTION (A)  Final   Report Status 03/27/2021 FINAL  Final  Urine culture     Status: Abnormal   Collection Time: 04/01/21  5:32 PM   Specimen: Urine, Random  Result Value Ref Range Status   Specimen Description   Final    URINE, RANDOM Performed at [facility], [address], [place], [place] [address]    Special Requests   Final    NONE Performed at [facility], [address], [place], [place] [address]    Culture MULTIPLE SPECIES PRESENT, SUGGEST RECOLLECTION (A)  Final   Report Status 04/03/2021 FINAL  Final  Blood culture (routine x 2)      Status: None (Preliminary result)   Collection Time: 04/01/21  6:40 PM   Specimen: BLOOD LEFT FOREARM  Result Value Ref Range Status   Specimen Description BLOOD LEFT FOREARM  Final   Special Requests   Final    BOTTLES DRAWN AEROBIC AND ANAEROBIC Blood Culture adequate volume   Culture   Final    NO GROWTH 2 DAYS Performed at [facility], [address] [place],  [address]    Report Status PENDING  Incomplete  Resp Panel by RT-PCR (Flu A&B, Covid) Nasopharyngeal Swab     Status: None   Collection Time: 04/01/21  7:45 PM  Specimen: Nasopharyngeal Swab; Nasopharyngeal(NP) swabs in vial transport medium  Result Value Ref Range Status   SARS Coronavirus 2 by RT PCR NEGATIVE NEGATIVE Final    Comment: (NOTE) SARS-CoV-2 target nucleic acids are NOT DETECTED.  The SARS-CoV-2 RNA is generally detectable in upper respiratory specimens during the acute phase of infection. The lowest concentration of SARS-CoV-2 viral copies this assay can detect is 138 copies/mL. A negative result does not preclude SARS-Cov-2 infection and should not be used as the sole basis for treatment or other patient management decisions. A negative result may occur with  improper specimen collection/handling, submission of specimen other than nasopharyngeal swab, presence of viral mutation(s) within the areas targeted by this assay, and inadequate number of viral copies(<138 copies/mL). A negative result must be combined with clinical observations, patient history, and epidemiological information. The expected result is Negative.  Fact Sheet for Patients:  EntrepreneurPulse.com.au  Fact Sheet for Healthcare Providers:  IncredibleEmployment.be  This test is no t yet approved or cleared by the [place] FDA and  has been authorized for detection and/or diagnosis of SARS-CoV-2 by FDA under an Emergency Use Authorization (EUA). This EUA will remain  in effect  (meaning this test can be used) for the duration of the COVID-19 declaration under Section 564(b)(1) of the Act, 21 U.S.C.section 360bbb-3(b)(1), unless the authorization is terminated  or revoked sooner.       Influenza A by PCR NEGATIVE NEGATIVE Final   Influenza B by PCR NEGATIVE NEGATIVE Final    Comment: (NOTE) The Xpert Xpress SARS-CoV-2/FLU/RSV plus assay is intended as an aid in the diagnosis of influenza from Nasopharyngeal swab specimens and should not be used as a sole basis for treatment. Nasal washings and aspirates are unacceptable for Xpert Xpress SARS-CoV-2/FLU/RSV testing.  Fact Sheet for Patients: EntrepreneurPulse.com.au  Fact Sheet for Healthcare Providers: IncredibleEmployment.be  This test is not yet approved or cleared by the [place] FDA and has been authorized for detection and/or diagnosis of SARS-CoV-2 by FDA under an Emergency Use Authorization (EUA). This EUA will remain in effect (meaning this test can be used) for the duration of the COVID-19 declaration under Section 564(b)(1) of the Act, 21 U.S.C. section 360bbb-3(b)(1), unless the authorization is terminated or revoked.  Performed at [facility], [address] [place], [place] [address]   Blood culture (routine x 2)     Status: None (Preliminary result)   Collection Time: 04/01/21  8:53 PM   Specimen: Right Antecubital; Blood  Result Value Ref Range Status   Specimen Description RIGHT ANTECUBITAL  Final   Special Requests   Final    BOTTLES DRAWN AEROBIC AND ANAEROBIC Blood Culture adequate volume   Culture   Final    NO GROWTH 2 DAYS Performed at [facility], [address] [place], [place] [address]    Report Status PENDING  Incomplete     Radiology Studies: US Abdomen Limited RUQ (LIVER/GB)  Result Date: 04/02/2021 CLINICAL DATA:  Lower abdominal pain for 3 days. EXAM: ULTRASOUND ABDOMEN LIMITED RIGHT UPPER QUADRANT COMPARISON:  Abdominal  CT April 01, 2021 FINDINGS: Gallbladder: No gallstones or wall thickening visualized. Large amount of sludge within the gallbladder. No sonographic Murphy sign noted by sonographer. Common bile duct: Diameter: Enlarged to 11 mm. Liver: No focal lesion identified. Diffusely increased parenchymal echogenicity. Portal vein is patent on color Doppler imaging with normal direction of blood flow towards the liver. Other: None. IMPRESSION: Large amount of gallbladder sludge without evidence of acute cholecystitis. Abnormal  dilation of the extrahepatic common bile duct, which measures 11 mm. Downstream obstruction cannot be excluded with this appearance. Electronically Signed   By: Fidela Salisbury M.D.   On: 04/02/2021 11:44    Scheduled Meds:  amLODipine  10 mg Oral Daily   apixaban  5 mg Oral BID   vitamin C  500 mg Oral Daily   DULoxetine  60 mg Oral Daily   feeding supplement (GLUCERNA SHAKE)  237 mL Oral TID BM   furosemide  20 mg Oral Q T,Th,S,Su   insulin aspart  0-20 Units Subcutaneous TID WC   insulin aspart  0-5 Units Subcutaneous QHS   levETIRAcetam  750 mg Oral BID   metoprolol tartrate  50 mg Oral BID   nicotine  21 mg Transdermal Daily   oxybutynin  5 mg Oral QHS   pantoprazole  40 mg Oral Daily   rosuvastatin  10 mg Oral QPM   silver sulfADIAZINE   Topical Daily   umeclidinium bromide  1 puff Inhalation Daily   zinc sulfate  220 mg Oral Daily   Continuous Infusions:  vancomycin 500 mg (04/03/21 1608)     LOS: 2 days   Time spent: 35 mins   Makaila Windle Wynetta Emery, MD How to contact the [facility] Attending or Consulting provider [place] or covering provider during after hours [place], for this patient?  Check the care team in [facility] and look for a) attending/consulting [facility] provider listed and b) the [facility] team listed Log into [email or web address] and use 's universal password to access. If you do not have the password, please contact the hospital operator. Locate the [facility] provider you are  looking for under [facility] and page to a number that you can be directly reached. If you still have difficulty reaching the provider, please page the [facility] (Director on Call) for the Hospitalists listed on amion for assistance.  04/03/2021, 4:33 PM

## 2021-04-03 NOTE — Progress Notes (Signed)
At approx 0330, this RN along with a NT went into pt's room to check on her and give her a bath. Upon entering the room, this RN noticed blood on sheets. Pt was awake, lying in bed watching TV. This RN did a quick assessment of where the blood  was coming from and noticed pt's IV was hanging out. This RN then asked pt if she pulled out her IV. Pt stated "I don't know. Did you pull it out?" This RN then pulled the remaining tape off and used a dry 4x4 to hold pressure until bleeding stopped. This RN asked pt if a new IV could be placed so she would be able to get her ABX. Pt refused a new IV at this time.  After giving pt a bath and changing linens, this RN asked pt again if a new IV could be placed. Again pt refused.  Dr. Olevia Bowens and Charge RN notified.

## 2021-04-04 LAB — GLUCOSE, CAPILLARY
Glucose-Capillary: 143 mg/dL — ABNORMAL HIGH (ref 70–99)
Glucose-Capillary: 151 mg/dL — ABNORMAL HIGH (ref 70–99)

## 2021-04-04 MED ORDER — METOPROLOL TARTRATE 50 MG PO TABS: 50.0000 mg | ORAL_TABLET | Freq: Two times a day (BID) | ORAL | Status: DC

## 2021-04-04 MED ORDER — DOXYCYCLINE HYCLATE 100 MG PO CAPS: 100.0000 mg | ORAL_CAPSULE | Freq: Two times a day (BID) | ORAL | 0 refills | Status: AC

## 2021-04-04 MED ORDER — GLUCERNA SHAKE PO LIQD: 237.0000 mL | Freq: Three times a day (TID) | ORAL | 0 refills | Status: DC

## 2021-04-04 MED ORDER — OXYCODONE HCL 5 MG PO TABS: 5.0000 mg | ORAL_TABLET | Freq: Three times a day (TID) | ORAL | 0 refills | Status: AC | PRN

## 2021-04-04 MED ORDER — SILVER SULFADIAZINE 1 % EX CREA: TOPICAL_CREAM | Freq: Every day | CUTANEOUS | 0 refills | Status: AC

## 2021-04-04 MED ORDER — LORAZEPAM 0.5 MG PO TABS: 0.5000 mg | ORAL_TABLET | Freq: Three times a day (TID) | ORAL | 0 refills | Status: DC | PRN
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
## 2021-04-04 NOTE — Discharge Summary (Addendum)
Physician Discharge Summary  [name] WCB:[id number] DOB: [birth year] DOA: 04/01/2021  PCP: Caprice Renshaw, MD  Admit date: 04/01/2021 Discharge date: 04/04/2021  Admitted From:  [name] Disposition:  [facility]   Recommendations for Outpatient Follow-up:  Follow up with PCP in 1 weeks Follow up with cardiology as scheduled Follow up with wound care center as scheduled Please change dressings twice daily on legs.  Cleanse bilateral lower leg wounds with NS and pat dry  Apply silvadene cream to open wounds  Cover with telfa and kerlix/tape. Change daily. Please monitor blood sugar 4 times per day and treat per protocol.   PATIENT REFUSED [place]. WOULD NOT ALLOW LABS, IMAGING, OR IV.   Discharge Condition: STABLE   CODE STATUS: FULL  DIET: heart healthy carb modified    Brief Hospitalization Summary: Please see all hospital notes, images, labs for full details of the hospitalization. ADMISSION HPI:  [name]  is a 55 y.o. female, with history of tobacco abuse, paroxysmal atrial fibrillation, hyperlipidemia, hypertension, diabetes mellitus type 2, peripheral vascular disease, CHF, CKD, and more presents the ED with a chief complaint of fall.  Patient reports that a staff member at [facility] was pushing patient in a wheelchair when the wheelchair got caught on something and the whole wheelchair flipped over docking patient out.  She reports she had pain in her lower abdomen and hip after the fall.  She does not think she hit her head.  Patient is on Eliquis for paroxysmal atrial fibrillation.  So she came into the ED.  In the days leading up to this fall she reports that she has felt abdominal pain in her lower abdomen.  It started 3 days ago.  She reports she has been eating normally, had no nausea or vomiting.  She has had no change in her bowel habits.  She denies any dysuria.  Patient also denies dyspnea and cough.  She does wear 2 L oxygen at baseline.  Patient has  sores on her legs that she reports started weeping more and being more painful 3 days ago.  She reports clear drainage, no purulence, no bleeding.  She reports generalized weakness and fatigue as well.  Patient has no other complaints at this time.   Patient smokes, does not drink alcohol, does not use illicit drugs.  She is vaccinated for COVID.  Patient is full code.     In the ED Temp 98.4, heart rate 95-115, respiratory rate 16-28, blood pressure 140/95, satting 100% on 2 L X-rays of left and right inguinal do not show acute fracture or dislocation X-ray left hip shows no visible fracture or dislocation EKG shows an irregular sinus tachycardia 116, QTc 424 on the monitor patient appears to be in A. Fib Rate blood cell count 15.2, hemoglobin 9.6 Chemistry panel reveals an elevated creatinine that is at baseline 1.64, glucose 191 CT abdomen pelvis shows dilated gallbladder with sludge or noncalcified stones-no gallbladder wall thickening or pericholecystic fluid CT head shows no acute findings Patient was given 0.5 mg of Dilaudid x2, morphine 4 mg 1.5 L bolus given in the ED -no further fluids given as patient is fluid restricted with a history of CHF Vancomycin started for cellulitis  HOSPITAL COURSE  Assessment & Plan:   Principal Problem:   Cellulitis Active Problems:   HTN (hypertension), malignant   Type 2 diabetes mellitus with vascular disease ([place])   Protein-calorie malnutrition, severe ([place])   Chronic respiratory failure with hypoxia ([place])  Gallbladder sludge   Cellulitis right lower leg - initially treated with IV antibiotics and wound care however patient has removed IV and refuses further IV placement and will discharge on oral doxycycline x 10 days, continue wound care.  Ambulatory referral to vascular surgery.  Follow up with Dr. Sharol Given who has been monitoring wounds.    Cleanse bilateral lower leg wounds with NS and pat dry  Apply silvadene cream to open wounds  Cover  with telfa and kerlix/tape. Change daily.   PAF -patient is rate controlled and fully anticoagulated with apixaban.  Continue metoprolol as ordered.  Outpatient follow up with cardiology is already scheduled for Jul 1.    Essential hypertension-initially was poorly controlled but now has resumed home blood pressure medications and blood pressure is better controlled.  Increased metoprolol to 50 mg BID.    Type 2 diabetes mellitus, poorly controlled with neurological complications-peripheral neuropathy has been a significant issue.  A1c is 11.1% which is evidence of poorly controlled disease.  Continue current management and frequent CBG monitoring.  Resume home treatment plan.    Chronic respiratory failure-patient is stable on regular 2 L nasal cannula.   Hypokalemia - oral replacement given, PT REFUSED FURTHER LAB DRAWS.    Fall -patient had a fall at the facility but this has been further worked up with imaging and there have been no findings of fractures.  Hip and ankle x-rays have been negative.     Leukocytosis - WBC trending down with treatment. PT REFUSED FURTHER LAB DRAWS.   Gallbladder sludge - pt has no abdominal pain symptoms.  Abd [name] with no findings of acute cholecystitis.  PT REFUSED FURTHER TESTING.     DVT prophylaxis: apixaban  Code Status: Full  Family Communication: plan of care discussed with patient  Disposition: return to SNF  Status is: Inpatient   Remains inpatient appropriate because:Inpatient level of care appropriate due to severity of illness   Dispo: The patient is from: SNF              Anticipated d/c is to: SNF              Patient currently is not medically stable to d/c.              Difficult to place patient No  Discharge Diagnoses:  Principal Problem:   Cellulitis Active Problems:   HTN (hypertension), malignant   Type 2 diabetes mellitus with vascular disease ([place])   Protein-calorie malnutrition, severe ([facility])   Chronic respiratory failure  with hypoxia ([place])   Gallbladder sludge   Discharge Instructions: Discharge Instructions     AMB referral to wound care center   Complete by: As directed    Ambulatory referral to Vascular Surgery   Complete by: As directed       Allergies as of 04/04/2021   No Known Allergies      Medication List     TAKE these medications    acetaminophen 325 MG tablet Commonly known as: TYLENOL Take 2 tablets (650 mg total) by mouth every 6 (six) hours as needed for headache, fever or mild pain.   amLODipine 10 MG tablet Commonly known as: NORVASC Take 1 tablet (10 mg total) by mouth daily.   doxycycline 100 MG capsule Commonly known as: VIBRAMYCIN Take 1 capsule (100 mg total) by mouth 2 (two) times daily for 10 days.   DULoxetine 60 MG capsule Commonly known as: CYMBALTA Take 1 capsule (60 mg total) by mouth  daily.   Eliquis 5 MG Tabs tablet Generic drug: apixaban Take 1 tablet (5 mg total) by mouth 2 (two) times daily. Okay to restart Eliquis on 11/17/2020   feeding supplement (GLUCERNA SHAKE) Liqd Take 237 mLs by mouth 3 (three) times daily between meals.   furosemide 40 MG tablet Commonly known as: Lasix Take 0.5 tablets (20 mg total) by mouth every Tuesday, Thursday, Saturday, and Sunday.   HumaLOG KwikPen 100 UNIT/ML KwikPen Generic drug: insulin lispro Inject 1-10 Units into the skin See admin instructions. 151-200 =1u, 201-250= 2u, 251-300= 4u, 301-350= 6u, 351-400= 8u, 401-450= 10u   levETIRAcetam 750 MG tablet Commonly known as: KEPPRA Take 1 tablet (750 mg total) by mouth 2 (two) times daily.   linagliptin 5 MG Tabs tablet Commonly known as: TRADJENTA Take 5 mg by mouth daily.   LORazepam 0.5 MG tablet Commonly known as: ATIVAN Take 1 tablet (0.5 mg total) by mouth 3 (three) times daily as needed for anxiety. What changed: reasons to take this   metoprolol tartrate 50 MG tablet Commonly known as: LOPRESSOR Take 1 tablet (50 mg total) by mouth 2 (two)  times daily. What changed:  medication strength how much to take   omeprazole 20 MG capsule Commonly known as: PRILOSEC Take 20 mg by mouth daily.   oxybutynin 5 MG 24 hr tablet Commonly known as: DITROPAN-XL Take 5 mg by mouth at bedtime.   oxyCODONE 5 MG immediate release tablet Commonly known as: Oxy IR/ROXICODONE Take 1 tablet (5 mg total) by mouth every 8 (eight) hours as needed for up to 3 days for moderate pain.   rosuvastatin 10 MG tablet Commonly known as: CRESTOR Take 10 mg by mouth daily.   silver sulfADIAZINE 1 % cream Commonly known as: SILVADENE Apply topically daily for 14 days. Apply to lower leg wounds.   Spiriva HandiHaler 18 MCG inhalation capsule Generic drug: tiotropium Place 18 mcg into inhaler and inhale daily as needed (Shortness of breath).   traZODone 50 MG tablet Commonly known as: DESYREL Take 1 tablet (50 mg total) by mouth at bedtime.   Trulicity 3 PR/9.1MB Sopn Generic drug: Dulaglutide Inject 3 mg into the skin See admin instructions. Inject 0.5 mls subcutaneously one time a day every Friday   vitamin C 500 MG tablet Commonly known as: ASCORBIC ACID Take 500 mg by mouth daily.   Zinc Oxide 10 % Aero Apply 1 application topically daily.   zinc sulfate 220 (50 Zn) MG capsule Take 1 capsule (220 mg total) by mouth daily.       ASK your doctor about these medications    albuterol 108 (90 Base) MCG/ACT inhaler Commonly known as: ProAir HFA INHALE 2 PUFFS EVERY 6 HOURS AS NEEDED FOR SHORTNESS OF BREATH/WHEEZING.        Contact information for follow-up providers     Newt Minion, MD. Schedule an appointment as soon as possible for a visit in 1 week(s).   Specialty: Orthopedic Surgery Why: Wound care follow up Contact information: [place] [address] [phone]              Contact information for after-discharge care     [facility] .    Service: Skilled Nursing Contact information: [place] [phone]                    No Known Allergies Allergies as of 04/04/2021  No Known Allergies      Medication List     TAKE these medications    acetaminophen 325 MG tablet Commonly known as: TYLENOL Take 2 tablets (650 mg total) by mouth every 6 (six) hours as needed for headache, fever or mild pain.   amLODipine 10 MG tablet Commonly known as: NORVASC Take 1 tablet (10 mg total) by mouth daily.   doxycycline 100 MG capsule Commonly known as: VIBRAMYCIN Take 1 capsule (100 mg total) by mouth 2 (two) times daily for 10 days.   DULoxetine 60 MG capsule Commonly known as: CYMBALTA Take 1 capsule (60 mg total) by mouth daily.   Eliquis 5 MG Tabs tablet Generic drug: apixaban Take 1 tablet (5 mg total) by mouth 2 (two) times daily. Okay to restart Eliquis on 11/17/2020   feeding supplement (GLUCERNA SHAKE) Liqd Take 237 mLs by mouth 3 (three) times daily between meals.   furosemide 40 MG tablet Commonly known as: Lasix Take 0.5 tablets (20 mg total) by mouth every Tuesday, Thursday, Saturday, and Sunday.   HumaLOG KwikPen 100 UNIT/ML KwikPen Generic drug: insulin lispro Inject 1-10 Units into the skin See admin instructions. 151-200 =1u, 201-250= 2u, 251-300= 4u, 301-350= 6u, 351-400= 8u, 401-450= 10u   levETIRAcetam 750 MG tablet Commonly known as: KEPPRA Take 1 tablet (750 mg total) by mouth 2 (two) times daily.   linagliptin 5 MG Tabs tablet Commonly known as: TRADJENTA Take 5 mg by mouth daily.   LORazepam 0.5 MG tablet Commonly known as: ATIVAN Take 1 tablet (0.5 mg total) by mouth 3 (three) times daily as needed for anxiety. What changed: reasons to take this   metoprolol tartrate 50 MG tablet Commonly known as: LOPRESSOR Take 1 tablet (50 mg total) by mouth 2 (two) times daily. What changed:  medication strength how much to take    omeprazole 20 MG capsule Commonly known as: PRILOSEC Take 20 mg by mouth daily.   oxybutynin 5 MG 24 hr tablet Commonly known as: DITROPAN-XL Take 5 mg by mouth at bedtime.   oxyCODONE 5 MG immediate release tablet Commonly known as: Oxy IR/ROXICODONE Take 1 tablet (5 mg total) by mouth every 8 (eight) hours as needed for up to 3 days for moderate pain.   rosuvastatin 10 MG tablet Commonly known as: CRESTOR Take 10 mg by mouth daily.   silver sulfADIAZINE 1 % cream Commonly known as: SILVADENE Apply topically daily for 14 days. Apply to lower leg wounds.   Spiriva HandiHaler 18 MCG inhalation capsule Generic drug: tiotropium Place 18 mcg into inhaler and inhale daily as needed (Shortness of breath).   traZODone 50 MG tablet Commonly known as: DESYREL Take 1 tablet (50 mg total) by mouth at bedtime.   Trulicity 3 VV/6.1YW Sopn Generic drug: Dulaglutide Inject 3 mg into the skin See admin instructions. Inject 0.5 mls subcutaneously one time a day every Friday   vitamin C 500 MG tablet Commonly known as: ASCORBIC ACID Take 500 mg by mouth daily.   Zinc Oxide 10 % Aero Apply 1 application topically daily.   zinc sulfate 220 (50 Zn) MG capsule Take 1 capsule (220 mg total) by mouth daily.       ASK your doctor about these medications    albuterol 108 (90 Base) MCG/ACT inhaler Commonly known as: ProAir HFA INHALE 2 PUFFS EVERY 6 HOURS AS NEEDED FOR SHORTNESS OF BREATH/WHEEZING.        Procedures/Studies: CT Abdomen Pelvis Wo Contrast  Result Date: 04/01/2021  CLINICAL DATA:  Abdominal pain following a fall yesterday. EXAM: CT ABDOMEN AND PELVIS WITHOUT CONTRAST TECHNIQUE: Multidetector CT imaging of the abdomen and pelvis was performed following the standard protocol without IV contrast. COMPARISON:  10/29/2020 FINDINGS: Lower chest: Stable enlarged heart. Resolved small bilateral pleural effusions. Minimal bibasilar atelectasis with improvement. Hepatobiliary:  Dilated gallbladder containing sludge or noncalcified gallstones. No gallbladder wall thickening or pericholecystic fluid. Pancreas: Unremarkable. No pancreatic ductal dilatation or surrounding inflammatory changes. Spleen: Normal in size without focal abnormality. Adrenals/Urinary Tract: Stable mild diffuse bilateral adrenal enlargement compatible with mild hyperplasia. Unremarkable kidneys, ureters and urinary bladder. Increased bilateral perinephric soft tissue stranding and fluid. Stomach/Bowel: Stomach is within normal limits. Appendix appears normal. No evidence of bowel wall thickening, distention, or inflammatory changes. Vascular/Lymphatic: Atheromatous arterial calcifications without aneurysm. No enlarged lymph nodes. Reproductive: Uterus and bilateral adnexa are unremarkable. Other: No abdominal wall hernia or abnormality. No abdominopelvic ascites. Musculoskeletal: Stable L1 vertebral superior endplate compression deformity with no acute fracture lines and minimal bony retropulsion. Stable grade 1 anterolisthesis at the L3-4 level. Multilevel lumbar and lower thoracic spine degenerative changes. IMPRESSION: 1. No acute abnormality. 2. Dilated gallbladder containing sludge or noncalcified gallstones. 3. Stable cardiomegaly. Electronically Signed   By: Claudie Revering M.D.   On: 04/01/2021 15:55   DG Ankle Complete Left  Result Date: 04/01/2021 CLINICAL DATA:  Right ankle pain following a fall yesterday. History of diabetic Charcot foot. EXAM: LEFT ANKLE COMPLETE - 3+ VIEW COMPARISON:  None. FINDINGS: Old, healed proximal lateral malleolus fracture. No acute fracture or dislocation seen. Diffuse osteopenia. Flattening of the talar dome and talotibial joint. Marked pes planus lateral subluxation of the metatarsals relative to the tarsals. IMPRESSION: 1. No acute fracture or dislocation. 2. Charcot changes of the foot with marked pes planus. Electronically Signed   By: Claudie Revering M.D.   On: 04/01/2021  16:04   DG Ankle Complete Right  Result Date: 04/01/2021 CLINICAL DATA:  Right yesterday.  Ankle pain after falling EXAM: RIGHT ANKLE - COMPLETE 3+ VIEW COMPARISON:  Right foot dated 08/02/2017 FINDINGS: Soft tissue irregularity in the lateral aspect of the distal lower leg. Mild talotibial degenerative changes. Marked pes planus. Moderate-sized inferior calcaneal enthesophyte. Lateral subluxation of the metatarsals relative to the tarsals. No fracture or dislocation seen. IMPRESSION: 1. No fracture or dislocation. 2. Charcot foot with marked pes planus. Electronically Signed   By: Claudie Revering M.D.   On: 04/01/2021 16:06   CT Head Wo Contrast  Result Date: 04/01/2021 CLINICAL DATA:  Head injury after fall. EXAM: CT HEAD WITHOUT CONTRAST TECHNIQUE: Contiguous axial images were obtained from the base of the skull through the vertex without intravenous contrast. COMPARISON:  Mar 22, 2021. FINDINGS: Brain: Old left cerebellar infarction is noted. No mass effect or midline shift is noted. Ventricular size is within normal limits. There is no evidence of mass lesion, hemorrhage or acute infarction. Vascular: No hyperdense vessel or unexpected calcification. Skull: Normal. Negative for fracture or focal lesion. Sinuses/Orbits: No acute finding. Other: None. IMPRESSION: No acute intracranial abnormality seen. Electronically Signed   By: Marijo Conception M.D.   On: 04/01/2021 15:51   CT HEAD WO CONTRAST  Result Date: 03/22/2021 CLINICAL DATA:  Altered mental status EXAM: CT HEAD WITHOUT CONTRAST TECHNIQUE: Contiguous axial images were obtained from the base of the skull through the vertex without intravenous contrast. COMPARISON:  Mar 11, 2021, August 15, 2020 FINDINGS: Brain: No evidence of acute infarction, hemorrhage, hydrocephalus, extra-axial collection or  mass lesion/mass effect. Periventricular white matter hypodensities consistent with sequela of chronic microvascular ischemic disease. Remote lacunar  infarction of the LEFT thalamus and caudate. Vascular: Vascular calcifications of the carotid siphons. Skull: No acute fracture.  Remote LEFT medial orbital wall fracture. Sinuses/Orbits: No acute finding. Other: None. IMPRESSION: No acute intracranial abnormality. Electronically Signed   By: Valentino Saxon MD   On: 03/22/2021 16:20   CT Head Wo Contrast  Result Date: 03/11/2021 CLINICAL DATA:  Delirium altered EXAM: CT HEAD WITHOUT CONTRAST TECHNIQUE: Contiguous axial images were obtained from the base of the skull through the vertex without intravenous contrast. COMPARISON:  CT 08/13/2020, MRI 08/15/2020 FINDINGS: Brain: No acute territorial infarction, hemorrhage, or intracranial mass. Mild atrophy. Chronic lacunar infarct within the left thalamus and left caudate. Moderate patchy white matter hypodensity consistent with chronic small vessel ischemic change. Stable ventricle size. Vascular: No hyperdense vessels.  Carotid vascular calcification Skull: Normal. Negative for fracture or focal lesion. Sinuses/Orbits: Old left medial orbital wall fracture. Mild mucosal thickening in the sinuses. Other: None IMPRESSION: 1. No CT evidence for acute intracranial abnormality. 2. Atrophy and chronic small vessel ischemic changes. Electronically Signed   By: Donavan Foil M.D.   On: 03/11/2021 17:32   DG Chest Port 1 View  Result Date: 03/09/2021 CLINICAL DATA:  Tachycardia EXAM: PORTABLE CHEST 1 VIEW COMPARISON:  December 03, 2020 FINDINGS: The lungs are clear. There is cardiomegaly with pulmonary vascularity normal. No adenopathy. No bone lesions. IMPRESSION: Cardiomegaly.  No edema or airspace opacity. Electronically Signed   By: Lowella Grip III M.D.   On: 03/09/2021 17:39   DG Hip Unilat With Pelvis 2-3 Views Left  Result Date: 04/01/2021 CLINICAL DATA:  Left hip pain following a fall yesterday. EXAM: DG HIP (WITH OR WITHOUT PELVIS) 2-3V LEFT COMPARISON:  None. FINDINGS: Poorly visualized bones due  to the thickness of the overlying soft tissues. No visible fracture or dislocation. Atheromatous arterial calcifications. IMPRESSION: No visible fracture or dislocation. Electronically Signed   By: Claudie Revering M.D.   On: 04/01/2021 16:08   US Abdomen Limited RUQ (LIVER/GB)  Result Date: 04/02/2021 CLINICAL DATA:  Lower abdominal pain for 3 days. EXAM: ULTRASOUND ABDOMEN LIMITED RIGHT UPPER QUADRANT COMPARISON:  Abdominal CT April 01, 2021 FINDINGS: Gallbladder: No gallstones or wall thickening visualized. Large amount of sludge within the gallbladder. No sonographic Murphy sign noted by sonographer. Common bile duct: Diameter: Enlarged to 11 mm. Liver: No focal lesion identified. Diffusely increased parenchymal echogenicity. Portal vein is patent on color Doppler imaging with normal direction of blood flow towards the liver. Other: None. IMPRESSION: Large amount of gallbladder sludge without evidence of acute cholecystitis. Abnormal dilation of the extrahepatic common bile duct, which measures 11 mm. Downstream obstruction cannot be excluded with this appearance. Electronically Signed   By: Fidela Salisbury M.D.   On: 04/02/2021 11:44     Subjective: Pt refusing to allow lab draws, refusing IV, refusing further care  Discharge Exam: Vitals:   04/03/21 1316 04/03/21 1938  BP: 111/90 (!) 117/97  Pulse: 88 95  Resp: 18 19  Temp: 98.5 F (36.9 C) 98.9 F (37.2 C)  SpO2: 100% 99%   Vitals:   04/03/21 0410 04/03/21 0742 04/03/21 1316 04/03/21 1938  BP: (!) 137/91 (!) 143/87 111/90 (!) 117/97  Pulse: (!) 102 (!) 104 88 95  Resp: 18 18 18 19   Temp: 99 F (37.2 C) 98.3 F (36.8 C) 98.5 F (36.9 C) 98.9 F (37.2 C)  TempSrc:  Oral Oral Oral Oral  SpO2: 93% 97% 100% 99%  Weight:      Height:       General exam: awake, alert, agitated at times, NAD. Appears calm and comfortable Respiratory system: Clear to auscultation. Respiratory effort normal. Cardiovascular system: normal S1 & S2 heard.  No JVD, murmurs, rubs, gallops or clicks. No pedal edema. Gastrointestinal system: Abdomen is nondistended, soft and nontender. No organomegaly or masses felt. Normal bowel sounds heard. Central nervous system: Alert and oriented. No focal neurological deficits. Extremities: Symmetric 5 x 5 power. Skin: bilateral LE ulcerations healing.  Wounds clean and dry.   Psychiatry: Judgement and insight appear poor. Mood & affect agitated.   The results of significant diagnostics from this hospitalization (including imaging, microbiology, ancillary and laboratory) are listed below for reference.     Microbiology: Recent Results (from the past 240 hour(s))  Urine culture     Status: Abnormal   Collection Time: 04/01/21  5:32 PM   Specimen: Urine, Random  Result Value Ref Range Status   Specimen Description   Final    URINE, RANDOM Performed at [facility], [address] [place], [place] [address]    Special Requests   Final    NONE Performed at [facility], [address] [place], [place] [address]    Culture MULTIPLE SPECIES PRESENT, SUGGEST RECOLLECTION (A)  Final   Report Status 04/03/2021 FINAL  Final  Blood culture (routine x 2)     Status: None (Preliminary result)   Collection Time: 04/01/21  6:40 PM   Specimen: BLOOD LEFT FOREARM  Result Value Ref Range Status   Specimen Description BLOOD LEFT FOREARM  Final   Special Requests   Final    BOTTLES DRAWN AEROBIC AND ANAEROBIC Blood Culture adequate volume   Culture   Final    NO GROWTH 3 DAYS Performed at [facility], [address], [place], [place] [address]    Report Status PENDING  Incomplete  Resp Panel by RT-PCR (Flu A&B, Covid) Nasopharyngeal Swab     Status: None   Collection Time: 04/01/21  7:45 PM   Specimen: Nasopharyngeal Swab; Nasopharyngeal(NP) swabs in vial transport medium  Result Value Ref Range Status   SARS Coronavirus 2 by RT PCR NEGATIVE NEGATIVE Final    Comment: (NOTE) SARS-CoV-2 target nucleic acids  are NOT DETECTED.  The SARS-CoV-2 RNA is generally detectable in upper respiratory specimens during the acute phase of infection. The lowest concentration of SARS-CoV-2 viral copies this assay can detect is 138 copies/mL. A negative result does not preclude SARS-Cov-2 infection and should not be used as the sole basis for treatment or other patient management decisions. A negative result may occur with  improper specimen collection/handling, submission of specimen other than nasopharyngeal swab, presence of viral mutation(s) within the areas targeted by this assay, and inadequate number of viral copies(<138 copies/mL). A negative result must be combined with clinical observations, patient history, and epidemiological information. The expected result is Negative.  Fact Sheet for Patients:  EntrepreneurPulse.com.au  Fact Sheet for Healthcare Providers:  IncredibleEmployment.be  This test is no t yet approved or cleared by the [place] FDA and  has been authorized for detection and/or diagnosis of SARS-CoV-2 by FDA under an Emergency Use Authorization (EUA). This EUA will remain  in effect (meaning this test can be used) for the duration of the COVID-19 declaration under Section 564(b)(1) of the Act, 21 U.S.C.section 360bbb-3(b)(1), unless the authorization is terminated  or revoked sooner.  Influenza A by PCR NEGATIVE NEGATIVE Final   Influenza B by PCR NEGATIVE NEGATIVE Final    Comment: (NOTE) The Xpert Xpress SARS-CoV-2/FLU/RSV plus assay is intended as an aid in the diagnosis of influenza from Nasopharyngeal swab specimens and should not be used as a sole basis for treatment. Nasal washings and aspirates are unacceptable for Xpert Xpress SARS-CoV-2/FLU/RSV testing.  Fact Sheet for Patients: EntrepreneurPulse.com.au  Fact Sheet for Healthcare Providers: IncredibleEmployment.be  This test is  not yet approved or cleared by the [place] FDA and has been authorized for detection and/or diagnosis of SARS-CoV-2 by FDA under an Emergency Use Authorization (EUA). This EUA will remain in effect (meaning this test can be used) for the duration of the COVID-19 declaration under Section 564(b)(1) of the Act, 21 U.S.C. section 360bbb-3(b)(1), unless the authorization is terminated or revoked.  Performed at [facility], [address], [place], [place] [address]   Blood culture (routine x 2)     Status: None (Preliminary result)   Collection Time: 04/01/21  8:53 PM   Specimen: Right Antecubital; Blood  Result Value Ref Range Status   Specimen Description RIGHT ANTECUBITAL  Final   Special Requests   Final    BOTTLES DRAWN AEROBIC AND ANAEROBIC Blood Culture adequate volume   Culture   Final    NO GROWTH 3 DAYS Performed at [facility], [address] [place], [place] [address]    Report Status PENDING  Incomplete     Labs: BNP (last 3 results) Recent Labs    01/16/21 1055 03/09/21 1735  BNP 352.0* 93.2   Basic Metabolic Panel: Recent Labs  Lab 04/01/21 1341 04/02/21 0533 04/03/21 0558  NA 142 141 143  K 3.6 3.6 3.0*  CL 106 109 107  CO2 26 24 24   GLUCOSE 191* 176* 171*  BUN 19 17 14   CREATININE 1.64* 1.45* 1.44*  CALCIUM 8.8* 8.5* 8.6*  MG  --  1.7  --    Liver Function Tests: Recent Labs  Lab 04/01/21 1341 04/02/21 0533 04/03/21 0558  AST 10* 8* 9*  ALT 9 9 8   ALKPHOS 69 62 63  BILITOT 0.7 0.5 0.7  PROT 7.0 6.2* 6.0*  ALBUMIN 1.8* 1.8* 1.7*   No results for input(s): LIPASE, AMYLASE in the last 168 hours. No results for input(s): AMMONIA in the last 168 hours. CBC: Recent Labs  Lab 04/01/21 1341 04/02/21 0533 04/03/21 0558  WBC 15.2* 11.5* 12.8*  NEUTROABS 12.2*  --  10.2*  HGB 9.6* 9.2* 9.2*  HCT 32.5* 31.9* 30.6*  MCV 96.7 96.4 95.9  PLT 536* 535* 529*   Cardiac Enzymes: No results for input(s): CKTOTAL, CKMB, CKMBINDEX, TROPONINI  in the last 168 hours. BNP: Invalid input(s): POCBNP CBG: Recent Labs  Lab 04/02/21 1053 04/02/21 1612 04/02/21 2124 04/03/21 0724 04/03/21 1128  GLUCAP 120* 148* 111* 168* 179*   D-Dimer No results for input(s): DDIMER in the last 72 hours. Hgb A1c No results for input(s): HGBA1C in the last 72 hours. Lipid Profile No results for input(s): CHOL, HDL, LDLCALC, TRIG, CHOLHDL, LDLDIRECT in the last 72 hours. Thyroid function studies No results for input(s): TSH, T4TOTAL, T3FREE, THYROIDAB in the last 72 hours.  Invalid input(s): FREET3 Anemia work up No results for input(s): VITAMINB12, FOLATE, FERRITIN, TIBC, IRON, RETICCTPCT in the last 72 hours. Urinalysis    Component Value Date/Time   COLORURINE STRAW (A) 04/01/2021 1732   APPEARANCEUR CLEAR 04/01/2021 1732   LABSPEC 1.008 04/01/2021 1732  PHURINE 6.0 04/01/2021 1732   GLUCOSEU 50 (A) 04/01/2021 1732   HGBUR NEGATIVE 04/01/2021 1732   BILIRUBINUR NEGATIVE 04/01/2021 1732   KETONESUR NEGATIVE 04/01/2021 1732   PROTEINUR 100 (A) 04/01/2021 1732   UROBILINOGEN 0.2 09/11/2014 1319   NITRITE NEGATIVE 04/01/2021 1732   LEUKOCYTESUR SMALL (A) 04/01/2021 1732   Sepsis Labs Invalid input(s): PROCALCITONIN,  WBC,  LACTICIDVEN Microbiology Recent Results (from the past 240 hour(s))  Urine culture     Status: Abnormal   Collection Time: 04/01/21  5:32 PM   Specimen: Urine, Random  Result Value Ref Range Status   Specimen Description   Final    URINE, RANDOM Performed at [facility], [address], [place], [place] [address]    Special Requests   Final    NONE Performed at [facility], [address], [place], [place] [address]    Culture MULTIPLE SPECIES PRESENT, SUGGEST RECOLLECTION (A)  Final   Report Status 04/03/2021 FINAL  Final  Blood culture (routine x 2)     Status: None (Preliminary result)   Collection Time: 04/01/21  6:40 PM   Specimen: BLOOD LEFT FOREARM  Result Value Ref Range Status   Specimen  Description BLOOD LEFT FOREARM  Final   Special Requests   Final    BOTTLES DRAWN AEROBIC AND ANAEROBIC Blood Culture adequate volume   Culture   Final    NO GROWTH 3 DAYS Performed at [facility], [address], [place], [place] [address]    Report Status PENDING  Incomplete  Resp Panel by RT-PCR (Flu A&B, Covid) Nasopharyngeal Swab     Status: None   Collection Time: 04/01/21  7:45 PM   Specimen: Nasopharyngeal Swab; Nasopharyngeal(NP) swabs in vial transport medium  Result Value Ref Range Status   SARS Coronavirus 2 by RT PCR NEGATIVE NEGATIVE Final    Comment: (NOTE) SARS-CoV-2 target nucleic acids are NOT DETECTED.  The SARS-CoV-2 RNA is generally detectable in upper respiratory specimens during the acute phase of infection. The lowest concentration of SARS-CoV-2 viral copies this assay can detect is 138 copies/mL. A negative result does not preclude SARS-Cov-2 infection and should not be used as the sole basis for treatment or other patient management decisions. A negative result may occur with  improper specimen collection/handling, submission of specimen other than nasopharyngeal swab, presence of viral mutation(s) within the areas targeted by this assay, and inadequate number of viral copies(<138 copies/mL). A negative result must be combined with clinical observations, patient history, and epidemiological information. The expected result is Negative.  Fact Sheet for Patients:  EntrepreneurPulse.com.au  Fact Sheet for Healthcare Providers:  IncredibleEmployment.be  This test is no t yet approved or cleared by the [place] FDA and  has been authorized for detection and/or diagnosis of SARS-CoV-2 by FDA under an Emergency Use Authorization (EUA). This EUA will remain  in effect (meaning this test can be used) for the duration of the COVID-19 declaration under Section 564(b)(1) of the Act, 21 U.S.C.section 360bbb-3(b)(1), unless  the authorization is terminated  or revoked sooner.       Influenza A by PCR NEGATIVE NEGATIVE Final   Influenza B by PCR NEGATIVE NEGATIVE Final    Comment: (NOTE) The Xpert Xpress SARS-CoV-2/FLU/RSV plus assay is intended as an aid in the diagnosis of influenza from Nasopharyngeal swab specimens and should not be used as a sole basis for treatment. Nasal washings and aspirates are unacceptable for Xpert Xpress SARS-CoV-2/FLU/RSV testing.  Fact Sheet for Patients: EntrepreneurPulse.com.au  Fact Sheet for Healthcare Providers: IncredibleEmployment.be  This test is not yet approved or cleared by the [place] FDA and has been authorized for detection and/or diagnosis of SARS-CoV-2 by FDA under an Emergency Use Authorization (EUA). This EUA will remain in effect (meaning this test can be used) for the duration of the COVID-19 declaration under Section 564(b)(1) of the Act, 21 U.S.C. section 360bbb-3(b)(1), unless the authorization is terminated or revoked.  Performed at [facility], [address], [place], [place] [address]   Blood culture (routine x 2)     Status: None (Preliminary result)   Collection Time: 04/01/21  8:53 PM   Specimen: Right Antecubital; Blood  Result Value Ref Range Status   Specimen Description RIGHT ANTECUBITAL  Final   Special Requests   Final    BOTTLES DRAWN AEROBIC AND ANAEROBIC Blood Culture adequate volume   Culture   Final    NO GROWTH 3 DAYS Performed at [facility], [address], [place], [place] [address]    Report Status PENDING  Incomplete   Time coordinating discharge: 38 mins   SIGNED:  Irwin Brakeman, MD  [facility] 04/04/2021, 11:41 AM How to contact the [facility] Attending or Consulting provider [place] or covering provider during after hours [place], for this patient?  Check the care team in [facility] and look for a) attending/consulting TRH provider listed and b) the [facility] team listed Log  into [email or web address] and use [place]'s universal password to access. If you do not have the password, please contact the hospital operator. Locate the [facility] provider you are looking for under [facility] and page to a number that you can be directly reached. If you still have difficulty reaching the provider, please page the [facility] (Director on Call) for the Hospitalists listed on amion for assistance.

## 2021-04-04 NOTE — Progress Notes (Addendum)
Pt refused to be repositioned in bed.  Pt refused morning VS and bath. Pt refused for lab to draw her blood. Pt refused to have her tele leads placed back on.

## 2021-04-04 NOTE — Care Management Important Message (Signed)
Important Message  Patient Details  Name: [name] MRN: [id number] Date of Birth: 03-02-1966   Medicare Important Message Given:  Yes     Tommy Medal 04/04/2021, 9:26 AM

## 2021-04-04 NOTE — TOC Transition Note (Signed)
Transition of Care [facility]) - CM/SW Discharge Note   Patient Details  Name: [name] MRN: [id number] Date of Birth: [birth year]  Transition of Care [facility]) CM/SW Contact:  Ihor Gully, LCSW Phone Number: 04/04/2021, 2:14 PM   Clinical Narrative:    Discharge clinicals sent to facility. Daughter notified of of patient's discharge.    Final next level of care: Long Term Nursing Home Barriers to Discharge: Continued Medical Work up   Patient Goals and CMS Choice Patient states their goals for this hospitalization and ongoing recovery are:: return to facility      Discharge Placement                       Discharge Plan and Services                                     Social Determinants of Health (SDOH) Interventions     Readmission Risk Interventions Readmission Risk Prevention Plan 10/31/2020 08/27/2020  Transportation Screening Complete Complete  Medication Review Press photographer) Complete Complete  PCP or Specialist appointment within 3-5 days of discharge - Complete  HRI or [place] - Complete  SW Recovery Care/Counseling Consult Complete Complete  Palliative Care Screening Not Applicable Not Applicable  Skilled Nursing Facility Complete Complete  Some recent data might be hidden

## 2021-04-05 DIAGNOSIS — M6281 Muscle weakness (generalized): Secondary | ICD-10-CM | POA: Diagnosis not present

## 2021-04-05 DIAGNOSIS — R279 Unspecified lack of coordination: Secondary | ICD-10-CM | POA: Diagnosis not present

## 2021-04-05 DIAGNOSIS — E1100 Type 2 diabetes mellitus with hyperosmolarity without nonketotic hyperglycemic-hyperosmolar coma (NKHHC): Secondary | ICD-10-CM | POA: Diagnosis not present

## 2021-04-06 LAB — CULTURE, BLOOD (ROUTINE X 2)
Culture: NO GROWTH
Culture: NO GROWTH
Special Requests: ADEQUATE
Special Requests: ADEQUATE

## 2021-04-07 DIAGNOSIS — I10 Essential (primary) hypertension: Secondary | ICD-10-CM | POA: Diagnosis not present

## 2021-04-07 DIAGNOSIS — E785 Hyperlipidemia, unspecified: Secondary | ICD-10-CM | POA: Diagnosis not present

## 2021-04-07 DIAGNOSIS — K219 Gastro-esophageal reflux disease without esophagitis: Secondary | ICD-10-CM | POA: Diagnosis not present

## 2021-04-07 DIAGNOSIS — I5030 Unspecified diastolic (congestive) heart failure: Secondary | ICD-10-CM | POA: Diagnosis not present

## 2021-04-07 DIAGNOSIS — E119 Type 2 diabetes mellitus without complications: Secondary | ICD-10-CM | POA: Diagnosis not present

## 2021-04-07 DIAGNOSIS — I4891 Unspecified atrial fibrillation: Secondary | ICD-10-CM | POA: Diagnosis not present

## 2021-04-07 DIAGNOSIS — R5381 Other malaise: Secondary | ICD-10-CM | POA: Diagnosis not present

## 2021-04-08 DIAGNOSIS — E1100 Type 2 diabetes mellitus with hyperosmolarity without nonketotic hyperglycemic-hyperosmolar coma (NKHHC): Secondary | ICD-10-CM | POA: Diagnosis not present

## 2021-04-08 DIAGNOSIS — M6281 Muscle weakness (generalized): Secondary | ICD-10-CM | POA: Diagnosis not present

## 2021-04-08 DIAGNOSIS — R279 Unspecified lack of coordination: Secondary | ICD-10-CM | POA: Diagnosis not present

## 2021-04-08 DIAGNOSIS — S81809A Unspecified open wound, unspecified lower leg, initial encounter: Secondary | ICD-10-CM | POA: Diagnosis not present

## 2021-04-09 ENCOUNTER — Ambulatory Visit: Payer: Medicare Other | Admitting: Internal Medicine

## 2021-04-09 ENCOUNTER — Ambulatory Visit: Payer: Medicare Other | Admitting: Urology

## 2021-04-09 DIAGNOSIS — R279 Unspecified lack of coordination: Secondary | ICD-10-CM | POA: Diagnosis not present

## 2021-04-09 DIAGNOSIS — M6281 Muscle weakness (generalized): Secondary | ICD-10-CM | POA: Diagnosis not present

## 2021-04-09 DIAGNOSIS — E1100 Type 2 diabetes mellitus with hyperosmolarity without nonketotic hyperglycemic-hyperosmolar coma (NKHHC): Secondary | ICD-10-CM | POA: Diagnosis not present

## 2021-04-10 ENCOUNTER — Ambulatory Visit (HOSPITAL_COMMUNITY): Admission: RE | Admit: 2021-04-10 | Payer: Medicare Other | Source: Ambulatory Visit

## 2021-04-10 ENCOUNTER — Encounter: Payer: Self-pay | Admitting: Orthopedic Surgery

## 2021-04-10 ENCOUNTER — Ambulatory Visit (INDEPENDENT_AMBULATORY_CARE_PROVIDER_SITE_OTHER): Payer: Medicare Other | Admitting: Vascular Surgery

## 2021-04-10 ENCOUNTER — Other Ambulatory Visit: Payer: Self-pay | Admitting: *Deleted

## 2021-04-10 ENCOUNTER — Encounter: Payer: Self-pay | Admitting: Vascular Surgery

## 2021-04-10 ENCOUNTER — Ambulatory Visit (INDEPENDENT_AMBULATORY_CARE_PROVIDER_SITE_OTHER): Payer: Medicare Other | Admitting: Orthopedic Surgery

## 2021-04-10 ENCOUNTER — Other Ambulatory Visit: Payer: Self-pay

## 2021-04-10 VITALS — BP 104/72 | HR 60 | Temp 98.3°F | Resp 20 | Ht 61.0 in | Wt 184.0 lb

## 2021-04-10 DIAGNOSIS — M6281 Muscle weakness (generalized): Secondary | ICD-10-CM | POA: Diagnosis not present

## 2021-04-10 DIAGNOSIS — S81801A Unspecified open wound, right lower leg, initial encounter: Secondary | ICD-10-CM

## 2021-04-10 DIAGNOSIS — S81802A Unspecified open wound, left lower leg, initial encounter: Secondary | ICD-10-CM

## 2021-04-10 DIAGNOSIS — I739 Peripheral vascular disease, unspecified: Secondary | ICD-10-CM

## 2021-04-10 DIAGNOSIS — E1100 Type 2 diabetes mellitus with hyperosmolarity without nonketotic hyperglycemic-hyperosmolar coma (NKHHC): Secondary | ICD-10-CM | POA: Diagnosis not present

## 2021-04-10 DIAGNOSIS — R279 Unspecified lack of coordination: Secondary | ICD-10-CM | POA: Diagnosis not present

## 2021-04-10 NOTE — Progress Notes (Signed)
Referring Physician: Dr. Sharol Given  Patient name: [name] MRN: [id number] DOB: 16-Apr-1966 Sex: female  REASON FOR CONSULT: Bilateral nonhealing leg wounds  HPI: [name] is a 55 y.o. female, with chronic leg wounds both lower extremities.  She is currently receiving local wound care at her skilled nursing facility.  She has been nonambulatory for at least 6 months if not longer.  She has chronic pain from both wounds.  Her day primarily consists of sitting in a wheelchair and watching television.  She is full Counsellor.  She has a history of noncompliance and poorly controlled diabetes.  She also has COPD and CKD 3.  Other medical problems include hyperlipidemia, paroxysmal atrial fibrillation and sleep apnea.  She has a history of tobacco abuse and still currently intermittently smokes.  She is on Eliquis.  Past Medical History:  Diagnosis Date   Alcohol use    Ankle fracture, lateral malleolus, closed 2013   Anxiety    Aortic atherosclerosis ([place]) 03/10/2021   Breast mass, left 2013   CHF (congestive heart failure) ([place])    a. EF 20-25% by echo in 05/2016 with cath showing normal cors b. EF 50-55% in 07/2020 c. 01/2021: EF at 55-60% with moderate LVH   Chronic anemia    CKD (chronic kidney disease), stage III (HCC)    Cocaine abuse ([place])    COPD (chronic obstructive pulmonary disease) ([place])    Diabetes mellitus, type 2 ([place])    Diabetic Charcot foot ([place])    Essential hypertension    History of cardiomyopathy    History of GI bleed 2011   Hyperlipidemia    Noncompliance    Obesity    PAF (paroxysmal atrial fibrillation) (HCC)    Panic attacks    PAT (paroxysmal atrial tachycardia) ([place])    Previously on Amiodarone   Seizures ([place]) 10/01/2020   Sleep apnea    Not on CPAP   Stroke ([place]) 2018   Tobacco abuse    Urinary incontinence    Past Surgical History:  Procedure Laterality Date   BIOPSY  11/12/2020   Procedure: BIOPSY;  Surgeon: Harvel Quale, MD;  [facility];  Service: Gastroenterology;;   BREAST BIOPSY     CARDIAC CATHETERIZATION N/A 07/28/2016   Procedure: Left Heart Cath and Coronary Angiography;  Surgeon: Jettie Booze, MD;  Location: [facility];  Service: Cardiovascular;  Laterality: N/A;   COLONOSCOPY N/A 05/10/2019   Procedure: COLONOSCOPY;  Surgeon: Danie Binder, MD;  [facility];  Service: Endoscopy;  Laterality: N/A;  Phenergan 12.5 mg IV in pre-op   DILATION AND CURETTAGE OF UTERUS     ESOPHAGOGASTRODUODENOSCOPY (EGD) WITH PROPOFOL N/A 11/12/2020   Procedure: ESOPHAGOGASTRODUODENOSCOPY (EGD) WITH PROPOFOL;  Surgeon: Harvel Quale, MD;  [facility];  Service: Gastroenterology;  Laterality: N/A;   I & D EXTREMITY Bilateral 09/22/2017   Procedure: BILATERAL DEBRIDEMENT LEG/FOOT ULCERS, APPLY VERAFLO WOUND VAC;  Surgeon: Newt Minion, MD;  Location: [facility];  Service: Orthopedics;  Laterality: Bilateral;   I & D EXTREMITY Right 10/11/2018   Procedure: IRRIGATION AND DEBRIDEMENT RIGHT HAND;  Surgeon: [name], MD;  Location: [facility];  Service: Orthopedics;  Laterality: Right;   I & D EXTREMITY Right 10/13/2018   Procedure: REPEAT IRRIGATION AND DEBRIDEMENT RIGHT HAND;  Surgeon: [name], MD;  Location: [facility];  Service: Orthopedics;  Laterality: Right;   I & D EXTREMITY Right 11/22/2018  Procedure: IRRIGATION AND DEBRIDEMENT AND PINNING RIGHT HAND;  Surgeon: [name], MD;  Location: [facility];  Service: Orthopedics;  Laterality: Right;   IR RADIOLOGIST EVAL & MGMT  07/05/2018   POLYPECTOMY  05/10/2019   Procedure: POLYPECTOMY;  Surgeon: Danie Binder, MD;  [facility];  Service: Endoscopy;;   SKIN SPLIT GRAFT Bilateral 09/28/2017   Procedure: BILATERAL SPLIT THICKNESS SKIN GRAFT LEGS/FEET AND APPLY VAC;  Surgeon: Newt Minion, MD;  Location: [facility];  Service: Orthopedics;  Laterality: Bilateral;   SKIN SPLIT GRAFT Right 11/22/2018    Procedure: SKIN GRAFT SPLIT THICKNESS;  Surgeon: [name], MD;  Location: [facility];  Service: Orthopedics;  Laterality: Right;    Family History  Problem Relation Age of Onset   Hypertension Mother    Heart attack Mother    Hypertension Father        CABG    Hypertension Sister    Hypertension Brother    Hypertension Sister    Cancer Sister        breast    Arthritis Other    Cancer Other    Diabetes Other    Asthma Other    Hypertension Daughter    Hypertension Son     SOCIAL HISTORY: Social History   Socioeconomic History   Marital status: Widowed    Spouse name: Not on file   Number of children: 2   Years of education: 9   Highest education level: 9th grade  Occupational History   Occupation: Disabled  Tobacco Use   Smoking status: Some Days    Packs/day: 0.25    Years: 20.00    Pack years: 5.00    Types: Cigarettes    Last attempt to quit: 06/03/2017    Years since quitting: 3.8   Smokeless tobacco: Never  Vaping Use   Vaping Use: Never used  Substance and Sexual Activity   Alcohol use: Not Currently    Alcohol/week: 0.0 standard drinks    Comment: Quit in 2017   Drug use: Not Currently    Frequency: 3.0 times per week    Types: Marijuana, Cocaine    Comment: none since August 2018   Sexual activity: Not on file  Other Topics Concern   Not on file  Social History Narrative   Right handed   Drinks 1-2 cups caffeine daily   Social Determinants of Health   Financial Resource Strain: Medium Risk   Difficulty of Paying Living Expenses: Somewhat hard  Food Insecurity: Food Insecurity Present   Worried About Charity fundraiser in the Last Year: Sometimes true   Ran Out of Food in the Last Year: Sometimes true  Transportation Needs: No Transportation Needs   Lack of Transportation (Medical): No   Lack of Transportation (Non-Medical): No  Physical Activity: Insufficiently Active   Days of Exercise per Week: 7 days   Minutes of Exercise per  Session: 20 min  Stress: Stress Concern Present   Feeling of Stress : To some extent  Social Connections: Moderately Isolated   Frequency of Communication with Friends and Family: More than three times a week   Frequency of Social Gatherings with Friends and Family: More than three times a week   Attends Religious Services: More than 4 times per year   Active Member of [employer] or Organizations: No   Attends Archivist Meetings: Never   Marital Status: Widowed  Intimate Partner Violence: Not At Risk   Fear of Current or  Ex-Partner: No   Emotionally Abused: No   Physically Abused: No   Sexually Abused: No    No Known Allergies  Current Outpatient Medications  Medication Sig Dispense Refill   acetaminophen (TYLENOL) 325 MG tablet Take 2 tablets (650 mg total) by mouth every 6 (six) hours as needed for headache, fever or mild pain.     albuterol (PROAIR HFA) 108 (90 Base) MCG/ACT inhaler INHALE 2 PUFFS EVERY 6 HOURS AS NEEDED FOR SHORTNESS OF BREATH/WHEEZING. (Patient taking differently: Inhale 2 puffs into the lungs every 6 (six) hours as needed for wheezing or shortness of breath.) 8.5 g 2   amLODipine (NORVASC) 10 MG tablet Take 1 tablet (10 mg total) by mouth daily. 30 tablet 5   apixaban (ELIQUIS) 5 MG TABS tablet Take 1 tablet (5 mg total) by mouth 2 (two) times daily. Okay to restart Eliquis on 11/17/2020 60 tablet 3   doxycycline (VIBRAMYCIN) 100 MG capsule Take 1 capsule (100 mg total) by mouth 2 (two) times daily for 10 days. 20 capsule 0   DULoxetine (CYMBALTA) 60 MG capsule Take 1 capsule (60 mg total) by mouth daily. 30 capsule 3   feeding supplement, GLUCERNA SHAKE, (GLUCERNA SHAKE) LIQD Take 237 mLs by mouth 3 (three) times daily between meals.  0   furosemide (LASIX) 40 MG tablet Take 0.5 tablets (20 mg total) by mouth every Tuesday, Thursday, Saturday, and Sunday. 30 tablet 6   HUMALOG KWIKPEN 100 UNIT/ML KwikPen Inject 1-10 Units into the skin See admin instructions.  151-200 =1u, 201-250= 2u, 251-300= 4u, 301-350= 6u, 351-400= 8u, 401-450= 10u     levETIRAcetam (KEPPRA) 750 MG tablet Take 1 tablet (750 mg total) by mouth 2 (two) times daily. 60 tablet 5   linagliptin (TRADJENTA) 5 MG TABS tablet Take 5 mg by mouth daily.     LORazepam (ATIVAN) 0.5 MG tablet Take 1 tablet (0.5 mg total) by mouth 3 (three) times daily as needed for anxiety. 20 tablet 0   metoprolol tartrate (LOPRESSOR) 50 MG tablet Take 1 tablet (50 mg total) by mouth 2 (two) times daily.     omeprazole (PRILOSEC) 20 MG capsule Take 20 mg by mouth daily.     oxybutynin (DITROPAN-XL) 5 MG 24 hr tablet Take 5 mg by mouth at bedtime.     rosuvastatin (CRESTOR) 10 MG tablet Take 10 mg by mouth daily.     silver sulfADIAZINE (SILVADENE) 1 % cream Apply topically daily for 14 days. Apply to lower leg wounds. 50 g 0   tiotropium (SPIRIVA HANDIHALER) 18 MCG inhalation capsule Place 18 mcg into inhaler and inhale daily as needed (Shortness of breath).     traZODone (DESYREL) 50 MG tablet Take 1 tablet (50 mg total) by mouth at bedtime. 30 tablet 3   TRULICITY 3 ZO/1.0RU SOPN Inject 3 mg into the skin See admin instructions. Inject 0.5 mls subcutaneously one time a day every Friday     vitamin C (ASCORBIC ACID) 500 MG tablet Take 500 mg by mouth daily.     Zinc Oxide 10 % AERO Apply 1 application topically daily.     zinc sulfate 220 (50 Zn) MG capsule Take 1 capsule (220 mg total) by mouth daily. 30 capsule 2   No current facility-administered medications for this visit.    ROS:   General:  No weight loss, Fever, chills  HEENT: No recent headaches, no nasal bleeding, no visual changes, no sore throat  Neurologic: No dizziness, blackouts, seizures. No recent symptoms  of stroke or mini- stroke. No recent episodes of slurred speech, or temporary blindness.  Cardiac: No recent episodes of chest pain/pressure, no shortness of breath at rest.  No shortness of breath with exertion.  Denies history of  atrial fibrillation or irregular heartbeat  Vascular: No history of rest pain in feet.  No history of claudication.  No history of non-healing ulcer, No history of DVT   Pulmonary: No home oxygen, no productive cough, no hemoptysis,  No asthma or wheezing  Musculoskeletal:  [ ]  Arthritis, [ ]  Low back pain,  [ ]  Joint pain  Hematologic:No history of hypercoagulable state.  No history of easy bleeding.  No history of anemia  Gastrointestinal: No hematochezia or melena,  No gastroesophageal reflux, no trouble swallowing  Urinary: [X]  chronic Kidney disease, [ ]  on HD - [ ]  MWF or [ ]  TTHS, [ ]  Burning with urination, [ ]  Frequent urination, [ ]  Difficulty urinating;   Skin: No rashes  Psychological: No history of anxiety,  No history of depression   Physical Examination  Vitals:   04/10/21 1427  BP: 104/72  Pulse: 60  Resp: 20  Temp: 98.3 F (36.8 C)  SpO2: 97%  Weight: 184 lb (83.5 kg)  Height: 5\' 1"  (1.549 m)    Body mass index is 34.77 kg/m.  General:  Alert and oriented, no acute distress HEENT: Normal Neck: No JVD Cardiac: Regular Rate and Rhythm Abdomen: Severely obese  skin: No rash, circumferential calf wounds right leg 10 x 10 cm, left leg 20 x 10 cm some areas of granulation tissue but large areas of devitalized fibrinous exudate which is black in appearance Extremity Pulses: Absent dorsalis pedis, posterior tibial pulses bilaterally Musculoskeletal: Right leg atrophy approximately 20% smaller than the left leg  Neurologic: Upper and lower extremity motor 5/5 and symmetric  DATA:  Patient unable to have ABIs performed due to painful wounds near the ankle area  ASSESSMENT: Extensive necrotic wounds bilateral lower extremities in a patient that is nonambulatory.  I do not believe she is a revascularization candidate.   PLAN: Patient was originally seen by Dr. Sharol Given earlier today who offered her either below-knee or above-knee amputations.  I would agree with  this assessment.  I offered the patient bilateral above-knee amputations.  She is going to discuss with her family further her options of continued local wound care at her skilled nursing facility versus amputations by either Dr. Sharol Given or myself.  She will follow-up on an as-needed basis.   Ruta Hinds, MD [facility] Office: [phone]
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
## 2021-04-11 ENCOUNTER — Encounter: Payer: Medicare Other | Attending: Physician Assistant | Admitting: Physician Assistant

## 2021-04-11 DIAGNOSIS — I130 Hypertensive heart and chronic kidney disease with heart failure and stage 1 through stage 4 chronic kidney disease, or unspecified chronic kidney disease: Secondary | ICD-10-CM | POA: Diagnosis not present

## 2021-04-11 DIAGNOSIS — E11622 Type 2 diabetes mellitus with other skin ulcer: Secondary | ICD-10-CM | POA: Insufficient documentation

## 2021-04-11 DIAGNOSIS — N183 Chronic kidney disease, stage 3 unspecified: Secondary | ICD-10-CM | POA: Diagnosis not present

## 2021-04-11 DIAGNOSIS — E11610 Type 2 diabetes mellitus with diabetic neuropathic arthropathy: Secondary | ICD-10-CM | POA: Insufficient documentation

## 2021-04-11 DIAGNOSIS — E1122 Type 2 diabetes mellitus with diabetic chronic kidney disease: Secondary | ICD-10-CM | POA: Diagnosis not present

## 2021-04-11 DIAGNOSIS — E1151 Type 2 diabetes mellitus with diabetic peripheral angiopathy without gangrene: Secondary | ICD-10-CM | POA: Insufficient documentation

## 2021-04-11 DIAGNOSIS — F17200 Nicotine dependence, unspecified, uncomplicated: Secondary | ICD-10-CM | POA: Diagnosis not present

## 2021-04-11 DIAGNOSIS — L97822 Non-pressure chronic ulcer of other part of left lower leg with fat layer exposed: Secondary | ICD-10-CM | POA: Insufficient documentation

## 2021-04-11 DIAGNOSIS — L97812 Non-pressure chronic ulcer of other part of right lower leg with fat layer exposed: Secondary | ICD-10-CM | POA: Insufficient documentation

## 2021-04-11 DIAGNOSIS — I5042 Chronic combined systolic (congestive) and diastolic (congestive) heart failure: Secondary | ICD-10-CM | POA: Diagnosis not present

## 2021-04-11 DIAGNOSIS — M069 Rheumatoid arthritis, unspecified: Secondary | ICD-10-CM | POA: Insufficient documentation
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
## 2021-04-15 DIAGNOSIS — M6281 Muscle weakness (generalized): Secondary | ICD-10-CM | POA: Diagnosis not present

## 2021-04-15 DIAGNOSIS — E1100 Type 2 diabetes mellitus with hyperosmolarity without nonketotic hyperglycemic-hyperosmolar coma (NKHHC): Secondary | ICD-10-CM | POA: Diagnosis not present

## 2021-04-15 DIAGNOSIS — R279 Unspecified lack of coordination: Secondary | ICD-10-CM | POA: Diagnosis not present

## 2021-04-16 NOTE — Progress Notes (Addendum)
ARNICE, VANEPPS ([id number]) Visit Report for 04/11/2021 Allergy List Details Patient Name: [name], [name] Date of Service: 04/11/2021 10:15 AM Medical Record Number: [id number] Patient Account Number: [id number] Date of Birth/Sex: 08/13/1966 (55 y.o. F) Treating RN: Cornell Barman Primary Care Ritesh Opara: Caprice Renshaw Other Clinician: Referring Nazarene Bunning: Irwin Brakeman Treating Tammala Weider/Extender: Skipper Cliche in Treatment: 0 Allergies Active Allergies No Known Drug Allergies Allergy Notes Electronic Signature(s) Signed: 04/11/2021 4:28:45 PM By: Gretta Cool, BSN, RN, CWS, Kim RN, BSN Entered By: Gretta Cool, BSN, RN, CWS, Kim on 04/11/2021 10:51:14 [name] ([id number]) -------------------------------------------------------------------------------- Arrival Information Details Patient Name: [name] Date of Service: 04/11/2021 10:15 AM Medical Record Number: [id number] Patient Account Number: [id number] Date of Birth/Sex: 1966-01-17 (55 y.o. F) Treating RN: Cornell Barman Primary Care Jovoni Borkenhagen: Caprice Renshaw Other Clinician: Referring Maybelle Depaoli: Irwin Brakeman Treating Greer Wainright/Extender: Skipper Cliche in Treatment: 0 Visit Information Patient Arrived: Wheel Chair Arrival Time: 10:48 Accompanied By: self Transfer Assistance: None Patient Identification Verified: Yes Secondary Verification Process Completed: Yes History Since Last Visit Notes Left patient in wheelchair for exam Electronic Signature(s) Signed: 04/11/2021 4:28:45 PM By: Gretta Cool, BSN, RN, CWS, Kim RN, BSN Entered By: Gretta Cool, BSN, RN, CWS, Kim on 04/11/2021 10:49:19 [name] ([id number]) -------------------------------------------------------------------------------- Clinic Level of Care Assessment Details Patient Name: [name] Date of Service: 04/11/2021 10:15 AM Medical Record Number: [id number] Patient Account Number: [id number] Date of Birth/Sex: 15-Oct-1966 (55 y.o. F) Treating RN:  Carlene Coria Primary Care Kendell Sagraves: Caprice Renshaw Other Clinician: Referring Dwight Burdo: Irwin Brakeman Treating Yaelis Scharfenberg/Extender: Skipper Cliche in Treatment: 0 Clinic Level of Care Assessment Items TOOL 2 Quantity Score X - Use when only an EandM is performed on the INITIAL visit 1 0 ASSESSMENTS - Nursing Assessment / Reassessment X - General Physical Exam (combine w/ comprehensive assessment (listed just below) when performed on new 1 20 pt. evals) X- 1 25 Comprehensive Assessment (HX, ROS, Risk Assessments, Wounds Hx, etc.) ASSESSMENTS - Wound and Skin Assessment / Reassessment []  - Simple Wound Assessment / Reassessment - one wound 0 X- 2 5 Complex Wound Assessment / Reassessment - multiple wounds []  - 0 Dermatologic / Skin Assessment (not related to wound area) ASSESSMENTS - Ostomy and/or Continence Assessment and Care []  - Incontinence Assessment and Management 0 []  - 0 Ostomy Care Assessment and Management (repouching, etc.) PROCESS - Coordination of Care X - Simple Patient / Family Education for ongoing care 1 15 []  - 0 Complex (extensive) Patient / Family Education for ongoing care []  - 0 Staff obtains Programmer, systems, Records, Test Results / Process Orders []  - 0 Staff telephones HHA, Nursing Homes / Clarify orders / etc []  - 0 Routine Transfer to another Facility (non-emergent condition) []  - 0 Routine Hospital Admission (non-emergent condition) X- 1 15 New Admissions / Biomedical engineer / Ordering NPWT, Apligraf, etc. []  - 0 Emergency Hospital Admission (emergent condition) []  - 0 Simple Discharge Coordination []  - 0 Complex (extensive) Discharge Coordination PROCESS - Special Needs []  - Pediatric / Minor Patient Management 0 []  - 0 Isolation Patient Management []  - 0 Hearing / Language / Visual special needs []  - 0 Assessment of Community assistance (transportation, D/C planning, etc.) []  - 0 Additional assistance / Altered mentation []  -  0 Support Surface(s) Assessment (bed, cushion, seat, etc.) INTERVENTIONS - Wound Cleansing / Measurement X - Wound Imaging (photographs - any number of wounds) 1 5 []  - 0 Wound Tracing (instead of photographs) []  - 0 Simple Wound Measurement - one wound X-  2 5 Complex Wound Measurement - multiple wounds [name], PEMBER. ([id number]) []  - 0 Simple Wound Cleansing - one wound X- 2 5 Complex Wound Cleansing - multiple wounds INTERVENTIONS - Wound Dressings []  - Small Wound Dressing one or multiple wounds 0 []  - 0 Medium Wound Dressing one or multiple wounds X- 2 20 Large Wound Dressing one or multiple wounds []  - 0 Application of Medications - injection INTERVENTIONS - Miscellaneous []  - External ear exam 0 []  - 0 Specimen Collection (cultures, biopsies, blood, body fluids, etc.) []  - 0 Specimen(s) / Culture(s) sent or taken to Lab for analysis []  - 0 Patient Transfer (multiple staff / Civil Service fast streamer / Similar devices) []  - 0 Simple Staple / Suture removal (25 or less) []  - 0 Complex Staple / Suture removal (26 or more) []  - 0 Hypo / Hyperglycemic Management (close monitor of Blood Glucose) []  - 0 Ankle / Brachial Index (ABI) - do not check if billed separately Has the patient been seen at the hospital within the last three years: Yes Total Score: 150 Level Of Care: New/Established - Level 4 Electronic Signature(s) Signed: 04/16/2021 4:27:57 PM By: Carlene Coria RN Entered By: Carlene Coria on 04/11/2021 12:52:46 [name] ([id number]) -------------------------------------------------------------------------------- Encounter Discharge Information Details Patient Name: [name] Date of Service: 04/11/2021 10:15 AM Medical Record Number: [id number] Patient Account Number: [id number] Date of Birth/Sex: 1965-12-21 (55 y.o. F) Treating RN: Dolan Amen Primary Care Betzalel Umbarger: Caprice Renshaw Other Clinician: Referring Marlaysia Lenig: Irwin Brakeman Treating  Charvi Gammage/Extender: Skipper Cliche in Treatment: 0 Encounter Discharge Information Items Discharge Condition: Stable Ambulatory Status: Wheelchair Discharge Destination: Skilled Nursing Facility Orders Sent: Yes Transportation: Private Auto Accompanied By: self Schedule Follow-up Appointment: Yes Clinical Summary of Care: Electronic Signature(s) Signed: 04/11/2021 4:57:24 PM By: Georges Mouse, Minus Breeding RN Entered By: Georges Mouse, Minus Breeding on 04/11/2021 12:00:37 [name] ([id number]) -------------------------------------------------------------------------------- Lower Extremity Assessment Details Patient Name: [name] Date of Service: 04/11/2021 10:15 AM Medical Record Number: [id number] Patient Account Number: [id number] Date of Birth/Sex: [birth year] (55 y.o. F) Treating RN: Cornell Barman Primary Care Ettel Albergo: Caprice Renshaw Other Clinician: Referring Shavonne Ambroise: Irwin Brakeman Treating Callin Ashe/Extender: Skipper Cliche in Treatment: 0 Edema Assessment Assessed: [Left: No] [Right: No] Edema: [Left: Yes] [Right: Yes] Vascular Assessment Pulses: Dorsalis Pedis Palpable: [Left:No] [Right:No] Doppler Audible: [Left:Inaudible] [Right:Inaudible] Posterior Tibial Palpable: [Left:No Inaudible] [Right:No Inaudible] Electronic Signature(s) Signed: 04/11/2021 4:28:45 PM By: Gretta Cool, BSN, RN, CWS, Kim RN, BSN Entered By: Gretta Cool, BSN, RN, CWS, Kim on 04/11/2021 11:17:05 [name] ([id number]) -------------------------------------------------------------------------------- Multi Wound Chart Details Patient Name: [name] Date of Service: 04/11/2021 10:15 AM Medical Record Number: [id number] Patient Account Number: [id number] Date of Birth/Sex: [birth year] (55 y.o. 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 N/A Comorbid History: Anemia, Chronic Obstructive Anemia, Chronic Obstructive N/A Pulmonary Disease (COPD), Sleep Pulmonary Disease (COPD), Sleep Apnea, Arrhythmia, Congestive Apnea, Arrhythmia, Congestive Heart Failure, Coronary Artery Heart Failure, Coronary Artery Disease, Hypertension, Myocardial Disease, Hypertension, Myocardial Infarction, Peripheral Arterial Infarction, Peripheral Arterial Disease, Type II Diabetes, Disease, Type II Diabetes, Rheumatoid Arthritis, Rheumatoid Arthritis, Osteoarthritis, Confinement Anxiety Osteoarthritis, Confinement Anxiety Date Acquired: 10/26/2020  10/26/2020 N/A Weeks of Treatment: 0 0 N/A Wound Status: Open Open N/A Clustered Wound: Yes Yes N/A Clustered Quantity: 2 2 N/A Pending Amputation on Yes Yes N/A Presentation: Measurements L x W x D (cm) 8x18x0.3 10x16x0.3 N/A Area (cm) : 113.097 125.664 N/A Volume (cm) : 33.929 37.699 N/A % Reduction in Area: 0.00% 0.00% N/A % Reduction in Volume: 0.00% 0.00% N/A Classification: Full Thickness Without Exposed Full Thickness Without Exposed N/A Support Structures Support Structures Exudate Amount: Medium Large N/A Exudate Type: Serosanguineous Serosanguineous N/A Exudate Color: red, brown red, brown N/A Wound Margin: N/A Flat and Intact N/A Granulation Amount: None Present (0%) None Present (0%) N/A Necrotic Amount: Large (67-100%) Large (67-100%) N/A [name], [name] ([id number]) Necrotic Tissue: Eschar, Adherent [place] N/A Exposed Structures: Fat Layer (Subcutaneous Tissue): Fat  Layer (Subcutaneous Tissue): N/A Yes Yes Fascia: No Fascia: No Tendon: No Tendon: No Muscle: No Muscle: No Joint: No Joint: No Bone: No Bone: No Epithelialization: N/A None N/A Treatment Notes Electronic Signature(s) Signed: 04/16/2021 4:27:57 PM By: Carlene Coria RN Entered By: Carlene Coria on 04/11/2021 11:31:18 [name] ([id number]) -------------------------------------------------------------------------------- [place] Details Patient Name: [name] Date of Service: 04/11/2021 10:15 AM Medical Record Number: [id number] Patient Account Number: [id number] Date of Birth/Sex: 1966-08-28 (55 y.o. F) Treating RN: Carlene Coria Primary Care Cloe Sockwell: Caprice Renshaw Other Clinician: Referring Marty Sadlowski: Irwin Brakeman Treating Kathy Wares/Extender: Skipper Cliche in Treatment: 0 Active Inactive Electronic Signature(s) Signed: 05/23/2021 7:55:32 AM By: Gretta Cool, BSN, RN, CWS, Kim RN, BSN Signed: 05/23/2021 4:34:20 PM By: Carlene Coria RN Previous Signature: 04/16/2021 4:27:57 PM Version By: Carlene Coria RN Entered By: Gretta Cool BSN, RN, CWS, Kim on 05/23/2021 07:55:31 [name] ([id number]) -------------------------------------------------------------------------------- Pain Assessment Details Patient Name: [name] Date of Service: 04/11/2021 10:15 AM Medical Record Number: [id number] Patient Account Number: [id number] Date of Birth/Sex: [birth year] (55 y.o. F) Treating RN: Cornell Barman Primary Care Theressa Piedra: Caprice Renshaw Other Clinician: Referring Rodrecus Belsky: Irwin Brakeman Treating Amritha Yorke/Extender: Skipper Cliche in Treatment: 0 Active Problems Location of Pain Severity and Description of Pain Patient Has Paino Yes Site Locations Pain Location: Generalized Pain Rate the pain. Current Pain Level: 8 Character of Pain Describe the Pain: Burning, Heavy, Sharp Pain Management and Medication Current Pain  Management: Notes Patient states pain in ankles. Electronic Signature(s) Signed: 04/11/2021 4:28:45 PM By: Gretta Cool, BSN, RN, CWS, Kim RN, BSN Entered By: Gretta Cool, BSN, RN, CWS, Kim on 04/11/2021 10:50:22 [name] ([id number]) -------------------------------------------------------------------------------- Patient/Caregiver Education Details Patient Name: [name] Date of Service: 04/11/2021 10:15 AM Medical Record Number: [id number] Patient Account Number: [id number] Date of Birth/Gender: September 16, 1966 (55 y.o. F) Treating RN: Carlene Coria Primary Care Physician: Caprice Renshaw Other Clinician: Referring Physician: Irwin Brakeman Treating Physician/Extender: Skipper Cliche in Treatment: 0 Education Assessment Education Provided To: Patient Education Topics Provided Wound/Skin Impairment: Methods: Explain/Verbal Responses: State content correctly Electronic Signature(s) Signed: 04/16/2021 4:27:57 PM By: Carlene Coria RN Entered By: Carlene Coria on 04/11/2021 12:53:02 [name] ([id number]) -------------------------------------------------------------------------------- Wound Assessment Details Patient Name: [name] Date of Service: 04/11/2021 10:15 AM Medical Record Number: [id number] Patient Account Number: [id number] Date of Birth/Sex: 03-30-1966 (55 y.o. F) Treating RN: Cornell Barman Primary Care Azelia Reiger: Caprice Renshaw Other Clinician: Referring Timiko Offutt: Irwin Brakeman Treating Lorn Butcher/Extender: Jeri Cos Weeks in Treatment: 0 Wound Status Wound Number: 8 Primary Arterial Insufficiency Ulcer Etiology: Wound Location: Right, Circumferential Lower Leg Wound Open Wounding Event: Gradually Appeared Status: Date Acquired: 10/26/2020 Comorbid Anemia, Chronic  Obstructive Pulmonary Disease (COPD), Weeks Of Treatment: 0 History: Sleep Apnea, Arrhythmia, Congestive Heart Failure, Clustered Wound: Yes Coronary Artery Disease, Hypertension,  Myocardial Pending Amputation On Presentation Infarction, Peripheral Arterial Disease, Type II Diabetes, Rheumatoid Arthritis, Osteoarthritis, Confinement Anxiety Photos Wound Measurements Length: (cm) 8 Width: (cm) 18 Depth: (cm) 0.3 Clustered Quantity: 2 Area: (cm) 113.097 Volume: (cm) 33.929 % Reduction in Area: 0% % Reduction in Volume: 0% Wound Description Classification: Full Thickness Without Exposed Support Structures Exudate Amount: Medium Exudate Type: Serosanguineous Exudate Color: red, brown Foul Odor After Cleansing: No Slough/Fibrino Yes Wound Bed Granulation Amount: None Present (0%) Exposed Structure Necrotic Amount: Large (67-100%) Fascia Exposed: No Necrotic Quality: Eschar, Adherent Slough Fat Layer (Subcutaneous Tissue) Exposed: Yes Tendon Exposed: No Muscle Exposed: No Joint Exposed: No Bone Exposed: No Electronic Signature(s) Signed: 04/11/2021 4:28:45 PM By: Gretta Cool, BSN, RN, CWS, Kim RN, BSN Entered By: Gretta Cool, BSN, RN, CWS, Kim on 04/11/2021 11:14:33 [name] ([id number]) -------------------------------------------------------------------------------- Wound Assessment Details Patient Name: [name] Date of Service: 04/11/2021 10:15 AM Medical Record Number: [id number] Patient Account Number: [id number] Date of Birth/Sex: 1966-10-01 (55 y.o. F) Treating RN: Cornell Barman Primary Care Rodneisha Bonnet: Caprice Renshaw Other Clinician: Referring Jasalyn Frysinger: Irwin Brakeman Treating Ronita Hargreaves/Extender: Jeri Cos Weeks in Treatment: 0 Wound Status Wound Number: 9 Primary Arterial Insufficiency Ulcer Etiology: Wound Location: Left, Circumferential Lower Leg Wound Open Wounding Event: Gradually Appeared Status: Date Acquired: 10/26/2020 Comorbid Anemia, Chronic Obstructive Pulmonary Disease (COPD), Weeks Of Treatment: 0 History: Sleep Apnea, Arrhythmia, Congestive Heart Failure, Clustered Wound: Yes Coronary Artery Disease, Hypertension,  Myocardial Pending Amputation On Presentation Infarction, Peripheral Arterial Disease, Type II Diabetes, Rheumatoid Arthritis, Osteoarthritis, Confinement Anxiety Photos Wound Measurements Length: (cm) 10 Width: (cm) 16 Depth: (cm) 0.3 Clustered Quantity: 2 Area: (cm) 125.664 Volume: (cm) 37.699 % Reduction in Area: 0% % Reduction in Volume: 0% Epithelialization: None Tunneling: No Undermining: No Wound Description Classification: Full Thickness Without Exposed Support Structures Wound Margin: Flat and Intact Exudate Amount: Large Exudate Type: Serosanguineous Exudate Color: red, brown Foul Odor After Cleansing: No Slough/Fibrino Yes Wound Bed Granulation Amount: None Present (0%) Exposed Structure Necrotic Amount: Large (67-100%) Fascia Exposed: No Necrotic Quality: Eschar, Adherent Slough Fat Layer (Subcutaneous Tissue) Exposed: Yes Tendon Exposed: No Muscle Exposed: No Joint Exposed: No Bone Exposed: No Electronic Signature(s) Signed: 04/11/2021 4:28:45 PM By: Gretta Cool, BSN, RN, CWS, Kim RN, BSN Entered By: Gretta Cool, BSN, RN, CWS, Kim on 04/11/2021 11:16:26 [name] ([id number]) -------------------------------------------------------------------------------- Vitals Details Patient Name: [name] Date of Service: 04/11/2021 10:15 AM Medical Record Number: [id number] Patient Account Number: [id number] Date of Birth/Sex: 02/12/1966 (55 y.o. F) Treating RN: Cornell Barman Primary Care Khalel Alms: Caprice Renshaw Other Clinician: Referring Shawan Corella: Irwin Brakeman Treating Zayde Stroupe/Extender: Skipper Cliche in Treatment: 0 Vital Signs Time Taken: 10:50 Temperature (F): 98.2 Pulse (bpm): 71 Respiratory Rate (breaths/min): 16 Blood Pressure (mmHg): 110/75 Reference Range: 80 - 120 mg / dl Electronic Signature(s) Signed: 04/11/2021 4:28:45 PM By: Gretta Cool, BSN, RN, CWS, Kim RN, BSN Entered By: Gretta Cool, BSN, RN, CWS, Kim on 04/11/2021 10:50:42

## 2021-04-16 NOTE — Progress Notes (Signed)
[name], [name] ([id number]) Visit Report for 04/11/2021 Chief Complaint Document Details Patient Name: [name], [name] Date of Service: 04/11/2021 10:15 AM Medical Record Number: [id number] Patient Account Number: [id number] Date of Birth/Sex: May 23, 1966 (54 y.o. F) Treating RN: [name] Primary Care Provider: Caprice [name] Other Clinician: Referring Provider: Irwin [name] Treating Provider/Extender: [name] in Treatment: 0 Information Obtained from: Patient Chief Complaint Bilateral LE Ulcers With Severe Arterial Insufficiency Electronic Signature(s) Signed: 04/11/2021 11:24:01 AM By: Barbara Keeler PA-C Entered By: [name] on 04/11/2021 11:24:01 [name] ([id number]) -------------------------------------------------------------------------------- HPI Details Patient Name: [name] Date of Service: 04/11/2021 10:15 AM Medical Record Number: [id number] Patient Account Number: [id number] Date of Birth/Sex: Mar 15, 1966 (54 y.o. F) Treating RN: [name] Primary Care Provider: Caprice [name] Other Clinician: Referring Provider: Irwin [name] Treating Provider/Extender: [name] in Treatment: 0 History of Present Illness HPI Description: 55 year old patient was recently seen by the vascular surgeon Dr. Bridgett [name], for bilateral foot ulcers. She has multiple health issues including polysubstance abuse and was noted to develop blisters about a month ago. She is known to have diabetes mellitus, hyperlipidemia, hypertension, alcohol abuse, chronic combined systolic and diastolic CHF, chronic kidney disease stage III, cocaine abuse,diabetic Charcot foot, hypertensive cardiomyopathy with ejection fraction being 20-25% and tobacco abuse. During her recent hospitalization she was put on Santyl ointment and was asked to follow-up with the wound care. She was treated with clindamycin for cellulitis. On Dr. Lianne [name] office visit on 09/03/2017  he recommended no vascular intervention at the present time. Her most recent hemoglobin A1c was more than 15.5% Readmission: 04/11/2021 upon evaluation today patient presents for initial inspection here in our clinic concerning issues that she is having with wounds over the bilateral lower extremities unfortunately. With that being said she tells me that she is having a lot of discomfort with the wounds currently. She has seen Dr. Sharol [name] as well as Dr. Oneida [name], vascular surgery. Subsequently it is noted in Dr. Oneida [name] note that this patient has been nonambulatory for the past 6 months if not longer. Further review of his note reveals that the patient does not appear to have any evidence of revascularization. In fact he states that he she has extensive necrotic wounds of bilateral lower extremities and again being nonambulatory he does not feel that she is a revascularization candidate. Subsequently Dr. Oneida [name] offered bilateral above-knee amputations as likely the remedy to prevent her from having issues with ongoing concerns with her legs and wounds in particular. She was to discuss this with her family and then figure out where she wanted to go in that regard. Subsequently she seems to be coming to [name] today essentially for more of a second opinion or really even a third opinion on her wounds and if there is anything that can be done to prevent amputation. This note from Dr. Oneida [name] was actually dated yesterday, 04/10/2021. The patient does currently have dressings that been done at the skilled nursing facility. Right now she is supposed to have calcium alginate with a ABD pad and Kerlix. Before that was Xeroform over top of a calcium alginate with Kerlix to secure in place. Not exactly sure what the thought process behind that was to be perfectly honest. Otherwise the patient does have a history of diabetes mellitus type 2, COPD, hypertension, chronic kidney disease stage III, congestive heart failure, and  subsequently she is nonambulatory. Electronic Signature(s) Signed: 04/11/2021 4:44:57 PM By: Barbara Keeler PA-C Entered By: [name],  [name] on 04/11/2021 16:44:57 [name], [name] ([id number]) -------------------------------------------------------------------------------- Physical Exam Details Patient Name: [name] Date of Service: 04/11/2021 10:15 AM Medical Record Number: [id number] Patient Account Number: [id number] Date of Birth/Sex: [birth year] (54 y.o. F) Treating RN: [name] Primary Care Provider: Caprice [name] Other Clinician: Referring Provider: Irwin [name] Treating Provider/Extender: [name] Weeks in Treatment: 0 Constitutional sitting or standing blood pressure is within target range for patient.. pulse regular and within target range for patient.Marland Kitchen respirations regular, non- labored and within target range for patient.Marland Kitchen temperature within target range for patient.. Well-nourished and well-hydrated in no acute distress. Eyes conjunctiva clear no eyelid edema noted. pupils equal round and reactive to light and accommodation. Ears, Nose, Mouth, and Throat no gross abnormality of ear auricles or external auditory canals. normal hearing noted during conversation. mucus membranes moist. Respiratory normal breathing without difficulty. Cardiovascular Absent posterior tibial and dorsalis pedis pulses bilateral lower extremities. Patient has bilateral stage III lymphedema along with significant peripheral artery disease with no Revascularization options.Marland Kitchen Psychiatric this patient is able to make decisions and demonstrates good insight into disease process. Alert and Oriented x 3. pleasant and cooperative. Notes Upon inspection patient's wound bed actually showed signs of significant necrotic tissue noted with bilateral lower extremities. She also has significant pain unfortunately. There does not appear to be any signs of active infection at this time which  is good. Nonetheless I think this is a very high risk patient for developing an infection. She is nonambulatory. She tells me she has not walked in quite some time this seems to be greater than 6 months probably longer than that. Electronic Signature(s) Signed: 04/11/2021 4:47:11 PM By: Barbara Keeler PA-C Entered By: [name] on 04/11/2021 16:47:11 [name] ([id number]) -------------------------------------------------------------------------------- Physician Orders Details Patient Name: [name] Date of Service: 04/11/2021 10:15 AM Medical Record Number: [id number] Patient Account Number: [id number] Date of Birth/Sex: [birth year] (54 y.o. F) Treating RN: Carlene Coria Primary Care Provider: Caprice [name] Other Clinician: Referring Provider: Irwin [name] Treating Provider/Extender: [name] in Treatment: 0 Verbal / Phone Orders: No Diagnosis Coding ICD-10 Coding Code Description E11.622 Type 2 diabetes mellitus with other skin ulcer L97.822 Non-pressure chronic ulcer of other part of left lower leg with fat layer exposed L97.812 Non-pressure chronic ulcer of other part of right lower leg with fat layer exposed I50.42 Chronic combined systolic (congestive) and diastolic (congestive) heart failure N18.30 Chronic kidney disease, stage 3 unspecified I10 Essential (primary) hypertension J44.9 Chronic obstructive pulmonary disease, unspecified Follow-up Appointments o Return Appointment in 1 week. Bathing/ Shower/ Hygiene o May shower; gently cleanse wound with antibacterial soap, rinse and pat dry prior to dressing wounds Edema Control - Lymphedema / Segmental Compressive Device / Other o Elevate, Exercise Daily and Avoid Standing for Long Periods of Time. o Elevate legs to the level of the heart and pump ankles as often as possible o Elevate leg(s) parallel to the floor when sitting. o DO YOUR BEST to sleep in the bed at night. DO NOT  sleep in your recliner. Long hours of sitting in a recliner leads to swelling of the legs and/or potential wounds on your backside. - or wheelchair Wound Treatment Wound #8 - Lower Leg Wound Laterality: Right, Circumferential Cleanser: Normal Saline 3 x Per Week/30 Days Discharge Instructions: Wash your hands with soap and water. Remove old dressing, discard into plastic bag and place into trash. Cleanse the wound with Normal Saline prior to applying a clean dressing using  gauze sponges, not tissues or cotton balls. Do not scrub or use excessive force. Pat dry using gauze sponges, not tissue or cotton balls. Primary Dressing: Silvercel 4 1/4x 4 1/4 (in/in) 3 x Per Week/30 Days Discharge Instructions: Apply Silvercel 4 1/4x 4 1/4 (in/in) as instructed Secondary Dressing: ABD Pad 5x9 (in/in) 3 x Per Week/30 Days Discharge Instructions: Cover with ABD pad Secondary Dressing: Kerlix 4.5 x 4.1 (in/yd) 3 x Per Week/30 Days Discharge Instructions: Apply Kerlix 4.5 x 4.1 (in/yd) as instructed Secured With: 50M Medipore H Soft Cloth Surgical Tape, 2x2 (in/yd) 3 x Per Week/30 Days Wound #9 - Lower Leg Wound Laterality: Left, Circumferential Cleanser: Normal Saline 3 x Per Week/30 Days Discharge Instructions: Wash your hands with soap and water. Remove old dressing, discard into plastic bag and place into trash. Cleanse the wound with Normal Saline prior to applying a clean dressing using gauze sponges, not tissues or cotton balls. Do not scrub or use excessive force. Pat dry using gauze sponges, not tissue or cotton balls. Primary Dressing: Silvercel 4 1/4x 4 1/4 (in/in) 3 x Per Week/30 Days Discharge Instructions: Apply Silvercel 4 1/4x 4 1/4 (in/in) as instructed Secondary Dressing: ABD Pad 5x9 (in/in) 3 x Per Week/30 Days Discharge Instructions: Cover with ABD pad [name], [name] ([id number]) Secondary Dressing: Kerlix 4.5 x 4.1 (in/yd) 3 x Per Week/30 Days Discharge Instructions: Apply Kerlix  4.5 x 4.1 (in/yd) as instructed Secured With: 50M Medipore H Soft Cloth Surgical Tape, 2x2 (in/yd) 3 x Per Week/30 Days Electronic Signature(s) Signed: 04/11/2021 4:51:48 PM By: Barbara Keeler PA-C Signed: 04/16/2021 4:27:57 PM By: Carlene Coria RN Entered By: Carlene Coria on 04/11/2021 11:40:06 [name] ([id number]) -------------------------------------------------------------------------------- Problem List Details Patient Name: [name] Date of Service: 04/11/2021 10:15 AM Medical Record Number: [id number] Patient Account Number: [id number] Date of Birth/Sex: 1966/05/30 (54 y.o. F) Treating RN: [name] Primary Care Provider: Caprice [name] Other Clinician: Referring Provider: Irwin [name] Treating Provider/Extender: [name] in Treatment: 0 Active Problems ICD-10 Encounter Code Description Active Date MDM Diagnosis E11.622 Type 2 diabetes mellitus with other skin ulcer 04/11/2021 No Yes L97.822 Non-pressure chronic ulcer of other part of left lower leg with fat layer 04/11/2021 No Yes exposed L97.812 Non-pressure chronic ulcer of other part of right lower leg with fat layer 04/11/2021 No Yes exposed I50.42 Chronic combined systolic (congestive) and diastolic (congestive) heart 04/11/2021 No Yes failure N18.30 Chronic kidney disease, stage 3 unspecified 04/11/2021 No Yes I10 Essential (primary) hypertension 04/11/2021 No Yes J44.9 Chronic obstructive pulmonary disease, unspecified 04/11/2021 No Yes Inactive Problems Resolved Problems Electronic Signature(s) Signed: 04/11/2021 11:23:15 AM By: Barbara Keeler PA-C Entered By: [name] on 04/11/2021 11:23:15 [name] ([id number]) -------------------------------------------------------------------------------- Progress Note Details Patient Name: [name] Date of Service: 04/11/2021 10:15 AM Medical Record Number: [id number] Patient Account Number: [id number] Date of Birth/Sex:  28-Jan-1966 (54 y.o. F) Treating RN: [name] Primary Care Provider: Caprice [name] Other Clinician: Referring Provider: Irwin [name] Treating Provider/Extender: [name] in Treatment: 0 Subjective Chief Complaint Information obtained from Patient Bilateral LE Ulcers With Severe Arterial Insufficiency History of Present Illness (HPI) 55 year old patient was recently seen by the vascular surgeon Dr. Bridgett [name], for bilateral foot ulcers. She has multiple health issues including polysubstance abuse and was noted to develop blisters about a month ago. She is known to have diabetes mellitus, hyperlipidemia, hypertension, alcohol abuse, chronic combined systolic and diastolic CHF, chronic kidney disease stage III, cocaine abuse,diabetic Charcot foot,  hypertensive cardiomyopathy with ejection fraction being 20-25% and tobacco abuse. During her recent hospitalization she was put on Santyl ointment and was asked to follow-up with the wound care. She was treated with clindamycin for cellulitis. On Dr. Lianne [name] office visit on 09/03/2017 he recommended no vascular intervention at the present time. Her most recent hemoglobin A1c was more than 15.5% Readmission: 04/11/2021 upon evaluation today patient presents for initial inspection here in our clinic concerning issues that she is having with wounds over the bilateral lower extremities unfortunately. With that being said she tells me that she is having a lot of discomfort with the wounds currently. She has seen Dr. Sharol [name] as well as Dr. Oneida [name], vascular surgery. Subsequently it is noted in Dr. Oneida [name] note that this patient has been nonambulatory for the past 6 months if not longer. Further review of his note reveals that the patient does not appear to have any evidence of revascularization. In fact he states that he she has extensive necrotic wounds of bilateral lower extremities and again being nonambulatory he does not feel that she is a  revascularization candidate. Subsequently Dr. Oneida [name] offered bilateral above-knee amputations as likely the remedy to prevent her from having issues with ongoing concerns with her legs and wounds in particular. She was to discuss this with her family and then figure out where she wanted to go in that regard. Subsequently she seems to be coming to [name] today essentially for more of a second opinion or really even a third opinion on her wounds and if there is anything that can be done to prevent amputation. This note from Dr. Oneida [name] was actually dated yesterday, 04/10/2021. The patient does currently have dressings that been done at the skilled nursing facility. Right now she is supposed to have calcium alginate with a ABD pad and Kerlix. Before that was Xeroform over top of a calcium alginate with Kerlix to secure in place. Not exactly sure what the thought process behind that was to be perfectly honest. Otherwise the patient does have a history of diabetes mellitus type 2, COPD, hypertension, chronic kidney disease stage III, congestive heart failure, and subsequently she is nonambulatory. Patient History Information obtained from Patient. Allergies No Known Drug Allergies Family History Cancer - Siblings, Heart Disease - Father,Siblings, Hypertension - Father,Siblings, Kidney Disease - Siblings, Seizures, No family history of Diabetes, Lung Disease, Stroke, Thyroid Problems, Tuberculosis. Social History Current every day smoker - 4 smoke breaks daily, Marital Status - Widowed, Alcohol Use - Never, Drug Use - Prior History, Caffeine Use - Never. Medical History Eyes Denies history of Cataracts, Glaucoma, Optic Neuritis Ear/Nose/Mouth/Throat Denies history of Chronic sinus problems/congestion, Middle ear problems Hematologic/Lymphatic Patient has history of Anemia Denies history of Hemophilia, Human Immunodeficiency Virus, Lymphedema, Sickle Cell Disease Respiratory Patient has history of  Chronic Obstructive Pulmonary Disease (COPD), Sleep Apnea Denies history of Aspiration, Asthma, Pneumothorax, Tuberculosis Cardiovascular Patient has history of Arrhythmia, Congestive Heart Failure, Coronary Artery Disease, Hypertension, Myocardial Infarction, Peripheral Arterial Disease Denies history of Deep Vein Thrombosis, Peripheral Venous Disease, Phlebitis, Vasculitis Gastrointestinal Denies history of Cirrhosis , Colitis, Crohn s, Hepatitis A Endocrine [name], [name] ([id number]) Patient has history of Type II Diabetes Denies history of Type I Diabetes Genitourinary Denies history of End Stage Renal Disease Immunological Denies history of Lupus Erythematosus, Raynaud s, Scleroderma Integumentary (Skin) Denies history of History of Burn, History of pressure wounds Musculoskeletal Patient has history of Rheumatoid Arthritis, Osteoarthritis Denies history of Gout, Osteomyelitis Neurologic Denies history of Dementia, Neuropathy,  Quadriplegia, Paraplegia, Seizure Disorder Oncologic Denies history of Received Chemotherapy, Received Radiation Psychiatric Patient has history of Confinement Anxiety Denies history of Anorexia/bulimia Review of Systems (ROS) Eyes Complains or has symptoms of Glasses / Contacts. Ear/Nose/Mouth/Throat Denies complaints or symptoms of Difficult clearing ears, Sinusitis. Hematologic/Lymphatic Denies complaints or symptoms of Bleeding / Clotting Disorders, Human Immunodeficiency Virus. Respiratory Complains or has symptoms of Shortness of Breath. Integumentary (Skin) Complains or has symptoms of Wounds. Denies complaints or symptoms of Bleeding or bruising tendency. Objective Constitutional sitting or standing blood pressure is within target range for patient.. pulse regular and within target range for patient.Marland Kitchen respirations regular, non- labored and within target range for patient.Marland Kitchen temperature within target range for patient.. Well-nourished  and well-hydrated in no acute distress. Vitals Time Taken: 10:50 AM, Temperature: 98.2 F, Pulse: 71 bpm, Respiratory Rate: 16 breaths/min, Blood Pressure: 110/75 mmHg. Eyes conjunctiva clear no eyelid edema noted. pupils equal round and reactive to light and accommodation. Ears, Nose, Mouth, and Throat no gross abnormality of ear auricles or external auditory canals. normal hearing noted during conversation. mucus membranes moist. Respiratory normal breathing without difficulty. Cardiovascular Absent posterior tibial and dorsalis pedis pulses bilateral lower extremities. Patient has bilateral stage III lymphedema along with significant peripheral artery disease with no Revascularization options.Marland Kitchen Psychiatric this patient is able to make decisions and demonstrates good insight into disease process. Alert and Oriented x 3. pleasant and cooperative. General Notes: Upon inspection patient's wound bed actually showed signs of significant necrotic tissue noted with bilateral lower extremities. She also has significant pain unfortunately. There does not appear to be any signs of active infection at this time which is good. Nonetheless I think this is a very high risk patient for developing an infection. She is nonambulatory. She tells me she has not walked in quite some time this seems to be greater than 6 months probably longer than that. Integumentary (Hair, Skin) Wound #8 status is Open. Original cause of wound was Gradually Appeared. The date acquired was: 10/26/2020. The wound is located on the Right,Circumferential Lower Leg. The wound measures 8cm length x 18cm width x 0.3cm depth; 113.097cm^2 area and 33.929cm^3 volume. There is Fat Layer (Subcutaneous Tissue) exposed. There is a medium amount of serosanguineous drainage noted. There is no granulation within the wound bed. There is a large (67-100%) amount of necrotic tissue within the wound bed including Eschar and Adherent Slough. Wound #9  status is Open. Original cause of wound was Gradually Appeared. The date acquired was: 10/26/2020. The wound is located on the [place]. ([id number]) Left,Circumferential Lower Leg. The wound measures 10cm length x 16cm width x 0.3cm depth; 125.664cm^2 area and 37.699cm^3 volume. There is Fat Layer (Subcutaneous Tissue) exposed. There is no tunneling or undermining noted. There is a large amount of serosanguineous drainage noted. The wound margin is flat and intact. There is no granulation within the wound bed. There is a large (67-100%) amount of necrotic tissue within the wound bed including Eschar and Adherent Slough. Assessment Active Problems ICD-10 Type 2 diabetes mellitus with other skin ulcer Non-pressure chronic ulcer of other part of left lower leg with fat layer exposed Non-pressure chronic ulcer of other part of right lower leg with fat layer exposed Chronic combined systolic (congestive) and diastolic (congestive) heart failure Chronic kidney disease, stage 3 unspecified Essential (primary) hypertension Chronic obstructive pulmonary disease, unspecified Plan Follow-up Appointments: Return Appointment in 1 week. Bathing/ Shower/ Hygiene: May shower; gently cleanse wound with antibacterial soap, rinse and pat  dry prior to dressing wounds Edema Control - Lymphedema / Segmental Compressive Device / Other: Elevate, Exercise Daily and Avoid Standing for Long Periods of Time. Elevate legs to the level of the heart and pump ankles as often as possible Elevate leg(s) parallel to the floor when sitting. DO YOUR BEST to sleep in the bed at night. DO NOT sleep in your recliner. Long hours of sitting in a recliner leads to swelling of the legs and/or potential wounds on your backside. - or wheelchair WOUND #8: - Lower Leg Wound Laterality: Right, Circumferential Cleanser: Normal Saline 3 x Per Week/30 Days Discharge Instructions: Wash your hands with soap and water. Remove old  dressing, discard into plastic bag and place into trash. Cleanse the wound with Normal Saline prior to applying a clean dressing using gauze sponges, not tissues or cotton balls. Do not scrub or use excessive force. Pat dry using gauze sponges, not tissue or cotton balls. Primary Dressing: Silvercel 4 1/4x 4 1/4 (in/in) 3 x Per Week/30 Days Discharge Instructions: Apply Silvercel 4 1/4x 4 1/4 (in/in) as instructed Secondary Dressing: ABD Pad 5x9 (in/in) 3 x Per Week/30 Days Discharge Instructions: Cover with ABD pad Secondary Dressing: Kerlix 4.5 x 4.1 (in/yd) 3 x Per Week/30 Days Discharge Instructions: Apply Kerlix 4.5 x 4.1 (in/yd) as instructed Secured With: 620M Medipore H Soft Cloth Surgical Tape, 2x2 (in/yd) 3 x Per Week/30 Days WOUND #9: - Lower Leg Wound Laterality: Left, Circumferential Cleanser: Normal Saline 3 x Per Week/30 Days Discharge Instructions: Wash your hands with soap and water. Remove old dressing, discard into plastic bag and place into trash. Cleanse the wound with Normal Saline prior to applying a clean dressing using gauze sponges, not tissues or cotton balls. Do not scrub or use excessive force. Pat dry using gauze sponges, not tissue or cotton balls. Primary Dressing: Silvercel 4 1/4x 4 1/4 (in/in) 3 x Per Week/30 Days Discharge Instructions: Apply Silvercel 4 1/4x 4 1/4 (in/in) as instructed Secondary Dressing: ABD Pad 5x9 (in/in) 3 x Per Week/30 Days Discharge Instructions: Cover with ABD pad Secondary Dressing: Kerlix 4.5 x 4.1 (in/yd) 3 x Per Week/30 Days Discharge Instructions: Apply Kerlix 4.5 x 4.1 (in/yd) as instructed Secured With: 620M Medipore H Soft Cloth Surgical Tape, 2x2 (in/yd) 3 x Per Week/30 Days 1. I did have a lengthy conversation with the patient today concerning her situation where things stand. With that being said honestly after reviewing her records and the overall appearance of the wounds I think she is at a high risk of developing an  infection and have any significant issue here. I discussed with her that I think that bilateral amputation may be her best bet to try to prevent these wounds from becoming increasingly worse, more painful, and subject to significant infection that could lead to sepsis. I explained that again I know this is not an easy decision nor 1 that should be taken lightly but I do believe she needs to discuss with her family what is going on here. 2. Offered as well having her come in with her family at the next visit next week in order to see what they recommended and felt was best for her based on their discussions. They can also ask any questions they wanted from me although again I am not the surgeon I would recommend Dr. Oneida [name] be referred back to for surgery if need be. 3. Also suggested in the meantime that we initiate treatment with a silver alginate dressing as well  as an ABD pad to cover and Kerlix to secure in place I would recommend avoiding the Ace wrap considering the significant peripheral vascular disease. [name], [name] ([id number]) We will see patient back for reevaluation in 1 week here in the clinic. If anything worsens or changes patient will contact our office for additional recommendations. Electronic Signature(s) Signed: 04/11/2021 4:49:05 PM By: Barbara Keeler PA-C Entered By: [name] on 04/11/2021 16:49:05 [name] ([id number]) -------------------------------------------------------------------------------- ROS/PFSH Details Patient Name: [name] Date of Service: 04/11/2021 10:15 AM Medical Record Number: [id number] Patient Account Number: [id number] Date of Birth/Sex: April 03, 1966 (54 y.o. F) Treating RN: Cornell Barman Primary Care Provider: Caprice [name] Other Clinician: Referring Provider: Irwin [name] Treating Provider/Extender: [name] in Treatment: 0 Information Obtained From Patient Eyes Complaints and Symptoms: Positive for:  Glasses / Contacts Medical History: Negative for: Cataracts; Glaucoma; Optic Neuritis Ear/Nose/Mouth/Throat Complaints and Symptoms: Negative for: Difficult clearing ears; Sinusitis Medical History: Negative for: Chronic sinus problems/congestion; Middle ear problems Hematologic/Lymphatic Complaints and Symptoms: Negative for: Bleeding / Clotting Disorders; Human Immunodeficiency Virus Medical History: Positive for: Anemia Negative for: Hemophilia; Human Immunodeficiency Virus; Lymphedema; Sickle Cell Disease Respiratory Complaints and Symptoms: Positive for: Shortness of Breath Medical History: Positive for: Chronic Obstructive Pulmonary Disease (COPD); Sleep Apnea Negative for: Aspiration; Asthma; Pneumothorax; Tuberculosis Integumentary (Skin) Complaints and Symptoms: Positive for: Wounds Negative for: Bleeding or bruising tendency Medical History: Negative for: History of Burn; History of pressure wounds Cardiovascular Medical History: Positive for: Arrhythmia; Congestive Heart Failure; Coronary Artery Disease; Hypertension; Myocardial Infarction; Peripheral Arterial Disease Negative for: Deep Vein Thrombosis; Peripheral Venous Disease; Phlebitis; Vasculitis Gastrointestinal Medical History: Negative for: Cirrhosis ; Colitis; Crohnos; Hepatitis A Endocrine Medical History: Positive for: Type II Diabetes [name], [name]. ([id number]) Negative for: Type I Diabetes Time with diabetes: 7 years Treated with: Insulin, Oral agents, Diet Blood sugar tested every day: No Genitourinary Medical History: Negative for: End Stage Renal Disease Immunological Medical History: Negative for: Lupus Erythematosus; Raynaudos; Scleroderma Musculoskeletal Medical History: Positive for: Rheumatoid Arthritis; Osteoarthritis Negative for: Gout; Osteomyelitis Neurologic Medical History: Negative for: Dementia; Neuropathy; Quadriplegia; Paraplegia; Seizure Disorder Oncologic Medical  History: Negative for: Received Chemotherapy; Received Radiation Psychiatric Medical History: Positive for: Confinement Anxiety Negative for: Anorexia/bulimia Immunizations Pneumococcal Vaccine: Received Pneumococcal Vaccination: No Implantable Devices No devices added Family and Social History Cancer: Yes - Siblings; Diabetes: No; Heart Disease: Yes - Father,Siblings; Hypertension: Yes - Father,Siblings; Kidney Disease: Yes - Siblings; Lung Disease: No; Seizures: Yes; Stroke: No; Thyroid Problems: No; Tuberculosis: No; Current every day smoker - 4 smoke breaks daily; Marital Status - Widowed; Alcohol Use: Never; Drug Use: Prior History; Caffeine Use: Never Electronic Signature(s) Signed: 04/11/2021 4:28:45 PM By: Gretta Cool, BSN, RN, CWS, Kim RN, BSN Signed: 04/11/2021 4:51:48 PM By: Barbara Keeler PA-C Entered By: Gretta Cool BSN, RN, CWS, Kim on 04/11/2021 10:55:16 [name] ([id number]) -------------------------------------------------------------------------------- SuperBill Details Patient Name: [name] Date of Service: 04/11/2021 Medical Record Number: [id number] Patient Account Number: [id number] Date of Birth/Sex: 1966-01-25 (55 y.o. F) Treating RN: Carlene Coria Primary Care Provider: Caprice [name] Other Clinician: Referring Provider: Irwin [name] Treating Provider/Extender: [name] in Treatment: 0 Diagnosis Coding ICD-10 Codes Code Description E11.622 Type 2 diabetes mellitus with other skin ulcer L97.822 Non-pressure chronic ulcer of other part of left lower leg with fat layer exposed L97.812 Non-pressure chronic ulcer of other part of right lower leg with fat layer exposed I50.42 Chronic combined systolic (congestive) and diastolic (congestive) heart failure N18.30  Chronic kidney disease, stage 3 unspecified I10 Essential (primary) hypertension J44.9 Chronic obstructive pulmonary disease, unspecified Facility Procedures CPT4 Code:  29798921 Description: 99214 - WOUND CARE VISIT-LEV 4 EST PT Modifier: Quantity: 1 Physician Procedures CPT4 Code: 1941740 Description: 81448 - WC PHYS LEVEL 4 - NEW PT Modifier: Quantity: 1 CPT4 Code: Description: ICD-10 Diagnosis Description E11.622 Type 2 diabetes mellitus with other skin ulcer L97.822 Non-pressure chronic ulcer of other part of left lower leg with fat lay L97.812 Non-pressure chronic ulcer of other part of right lower leg with fat  la I50.42 Chronic combined systolic (congestive) and diastolic (congestive) heart Modifier: er exposed yer exposed failure Quantity: Electronic Signature(s) Signed: 04/11/2021 4:49:23 PM By: Barbara Keeler PA-C Entered By: [name] on 04/11/2021 16:49:23

## 2021-04-17 ENCOUNTER — Ambulatory Visit: Payer: Medicare Other | Admitting: Physician Assistant

## 2021-04-17 DIAGNOSIS — J441 Chronic obstructive pulmonary disease with (acute) exacerbation: Secondary | ICD-10-CM | POA: Diagnosis not present

## 2021-04-17 DIAGNOSIS — I639 Cerebral infarction, unspecified: Secondary | ICD-10-CM | POA: Diagnosis not present

## 2021-04-17 DIAGNOSIS — M6281 Muscle weakness (generalized): Secondary | ICD-10-CM | POA: Diagnosis not present

## 2021-04-17 DIAGNOSIS — R269 Unspecified abnormalities of gait and mobility: Secondary | ICD-10-CM | POA: Diagnosis not present

## 2021-04-21 ENCOUNTER — Encounter (HOSPITAL_COMMUNITY): Payer: Self-pay | Admitting: Emergency Medicine

## 2021-04-21 ENCOUNTER — Inpatient Hospital Stay: Payer: Self-pay

## 2021-04-21 ENCOUNTER — Other Ambulatory Visit: Payer: Self-pay

## 2021-04-21 ENCOUNTER — Inpatient Hospital Stay (HOSPITAL_COMMUNITY)
Admission: EM | Admit: 2021-04-21 | Discharge: 2021-05-29 | DRG: 853 | Disposition: A | Discharge status: Skilled Nursing Facility | Payer: Medicare Other | Source: Skilled Nursing Facility | Attending: Internal Medicine | Admitting: Internal Medicine

## 2021-04-21 ENCOUNTER — Emergency Department (HOSPITAL_COMMUNITY): Payer: Medicare Other

## 2021-04-21 ENCOUNTER — Ambulatory Visit: Payer: Medicare Other | Admitting: Physician Assistant

## 2021-04-21 DIAGNOSIS — I482 Chronic atrial fibrillation, unspecified: Secondary | ICD-10-CM | POA: Diagnosis present

## 2021-04-21 DIAGNOSIS — R52 Pain, unspecified: Secondary | ICD-10-CM | POA: Diagnosis not present

## 2021-04-21 DIAGNOSIS — N179 Acute kidney failure, unspecified: Secondary | ICD-10-CM | POA: Diagnosis not present

## 2021-04-21 DIAGNOSIS — Z6831 Body mass index (BMI) 31.0-31.9, adult: Secondary | ICD-10-CM | POA: Diagnosis not present

## 2021-04-21 DIAGNOSIS — I130 Hypertensive heart and chronic kidney disease with heart failure and stage 1 through stage 4 chronic kidney disease, or unspecified chronic kidney disease: Secondary | ICD-10-CM | POA: Diagnosis present

## 2021-04-21 DIAGNOSIS — E872 Acidosis: Secondary | ICD-10-CM | POA: Diagnosis not present

## 2021-04-21 DIAGNOSIS — E1152 Type 2 diabetes mellitus with diabetic peripheral angiopathy with gangrene: Secondary | ICD-10-CM | POA: Diagnosis present

## 2021-04-21 DIAGNOSIS — L89152 Pressure ulcer of sacral region, stage 2: Secondary | ICD-10-CM | POA: Diagnosis present

## 2021-04-21 DIAGNOSIS — J440 Chronic obstructive pulmonary disease with acute lower respiratory infection: Secondary | ICD-10-CM | POA: Diagnosis not present

## 2021-04-21 DIAGNOSIS — F332 Major depressive disorder, recurrent severe without psychotic features: Secondary | ICD-10-CM | POA: Diagnosis not present

## 2021-04-21 DIAGNOSIS — K649 Unspecified hemorrhoids: Secondary | ICD-10-CM | POA: Diagnosis present

## 2021-04-21 DIAGNOSIS — F99 Mental disorder, not otherwise specified: Secondary | ICD-10-CM | POA: Diagnosis not present

## 2021-04-21 DIAGNOSIS — N1832 Chronic kidney disease, stage 3b: Secondary | ICD-10-CM

## 2021-04-21 DIAGNOSIS — R0902 Hypoxemia: Secondary | ICD-10-CM | POA: Diagnosis not present

## 2021-04-21 DIAGNOSIS — I10 Essential (primary) hypertension: Secondary | ICD-10-CM | POA: Diagnosis not present

## 2021-04-21 DIAGNOSIS — E11610 Type 2 diabetes mellitus with diabetic neuropathic arthropathy: Secondary | ICD-10-CM | POA: Diagnosis present

## 2021-04-21 DIAGNOSIS — E669 Obesity, unspecified: Secondary | ICD-10-CM | POA: Diagnosis present

## 2021-04-21 DIAGNOSIS — Z743 Need for continuous supervision: Secondary | ICD-10-CM | POA: Diagnosis not present

## 2021-04-21 DIAGNOSIS — F419 Anxiety disorder, unspecified: Secondary | ICD-10-CM | POA: Diagnosis present

## 2021-04-21 DIAGNOSIS — R32 Unspecified urinary incontinence: Secondary | ICD-10-CM | POA: Diagnosis present

## 2021-04-21 DIAGNOSIS — E1140 Type 2 diabetes mellitus with diabetic neuropathy, unspecified: Secondary | ICD-10-CM | POA: Diagnosis not present

## 2021-04-21 DIAGNOSIS — I4891 Unspecified atrial fibrillation: Secondary | ICD-10-CM | POA: Diagnosis not present

## 2021-04-21 DIAGNOSIS — F333 Major depressive disorder, recurrent, severe with psychotic symptoms: Secondary | ICD-10-CM | POA: Diagnosis present

## 2021-04-21 DIAGNOSIS — Z20822 Contact with and (suspected) exposure to covid-19: Secondary | ICD-10-CM | POA: Diagnosis present

## 2021-04-21 DIAGNOSIS — E876 Hypokalemia: Secondary | ICD-10-CM

## 2021-04-21 DIAGNOSIS — I480 Paroxysmal atrial fibrillation: Secondary | ICD-10-CM | POA: Diagnosis not present

## 2021-04-21 DIAGNOSIS — Z825 Family history of asthma and other chronic lower respiratory diseases: Secondary | ICD-10-CM

## 2021-04-21 DIAGNOSIS — B958 Unspecified staphylococcus as the cause of diseases classified elsewhere: Secondary | ICD-10-CM

## 2021-04-21 DIAGNOSIS — E0842 Diabetes mellitus due to underlying condition with diabetic polyneuropathy: Secondary | ICD-10-CM | POA: Diagnosis not present

## 2021-04-21 DIAGNOSIS — R630 Anorexia: Secondary | ICD-10-CM | POA: Diagnosis present

## 2021-04-21 DIAGNOSIS — I872 Venous insufficiency (chronic) (peripheral): Secondary | ICD-10-CM | POA: Diagnosis present

## 2021-04-21 DIAGNOSIS — E1151 Type 2 diabetes mellitus with diabetic peripheral angiopathy without gangrene: Secondary | ICD-10-CM | POA: Diagnosis not present

## 2021-04-21 DIAGNOSIS — I5032 Chronic diastolic (congestive) heart failure: Secondary | ICD-10-CM | POA: Diagnosis present

## 2021-04-21 DIAGNOSIS — I700 Atherosclerosis of aorta: Secondary | ICD-10-CM | POA: Diagnosis present

## 2021-04-21 DIAGNOSIS — E785 Hyperlipidemia, unspecified: Secondary | ICD-10-CM | POA: Diagnosis present

## 2021-04-21 DIAGNOSIS — Z8673 Personal history of transient ischemic attack (TIA), and cerebral infarction without residual deficits: Secondary | ICD-10-CM

## 2021-04-21 DIAGNOSIS — Z7189 Other specified counseling: Secondary | ICD-10-CM | POA: Diagnosis not present

## 2021-04-21 DIAGNOSIS — Z515 Encounter for palliative care: Secondary | ICD-10-CM | POA: Diagnosis not present

## 2021-04-21 DIAGNOSIS — Z9114 Patient's other noncompliance with medication regimen: Secondary | ICD-10-CM

## 2021-04-21 DIAGNOSIS — Z91199 Patient's noncompliance with other medical treatment and regimen due to unspecified reason: Secondary | ICD-10-CM

## 2021-04-21 DIAGNOSIS — E1165 Type 2 diabetes mellitus with hyperglycemia: Secondary | ICD-10-CM | POA: Diagnosis not present

## 2021-04-21 DIAGNOSIS — A419 Sepsis, unspecified organism: Secondary | ICD-10-CM | POA: Diagnosis not present

## 2021-04-21 DIAGNOSIS — R7881 Bacteremia: Secondary | ICD-10-CM | POA: Diagnosis not present

## 2021-04-21 DIAGNOSIS — Z794 Long term (current) use of insulin: Secondary | ICD-10-CM

## 2021-04-21 DIAGNOSIS — R000 Tachycardia, unspecified: Secondary | ICD-10-CM | POA: Diagnosis not present

## 2021-04-21 DIAGNOSIS — J441 Chronic obstructive pulmonary disease with (acute) exacerbation: Secondary | ICD-10-CM | POA: Diagnosis not present

## 2021-04-21 DIAGNOSIS — M79605 Pain in left leg: Secondary | ICD-10-CM | POA: Diagnosis not present

## 2021-04-21 DIAGNOSIS — E11649 Type 2 diabetes mellitus with hypoglycemia without coma: Secondary | ICD-10-CM | POA: Diagnosis not present

## 2021-04-21 DIAGNOSIS — R404 Transient alteration of awareness: Secondary | ICD-10-CM | POA: Diagnosis not present

## 2021-04-21 DIAGNOSIS — L89893 Pressure ulcer of other site, stage 3: Secondary | ICD-10-CM | POA: Diagnosis not present

## 2021-04-21 DIAGNOSIS — Z789 Other specified health status: Secondary | ICD-10-CM | POA: Diagnosis not present

## 2021-04-21 DIAGNOSIS — M79604 Pain in right leg: Secondary | ICD-10-CM | POA: Diagnosis not present

## 2021-04-21 DIAGNOSIS — Z9181 History of falling: Secondary | ICD-10-CM

## 2021-04-21 DIAGNOSIS — L97829 Non-pressure chronic ulcer of other part of left lower leg with unspecified severity: Secondary | ICD-10-CM | POA: Diagnosis present

## 2021-04-21 DIAGNOSIS — Z79899 Other long term (current) drug therapy: Secondary | ICD-10-CM

## 2021-04-21 DIAGNOSIS — Z7409 Other reduced mobility: Secondary | ICD-10-CM | POA: Diagnosis present

## 2021-04-21 DIAGNOSIS — E1122 Type 2 diabetes mellitus with diabetic chronic kidney disease: Secondary | ICD-10-CM | POA: Diagnosis present

## 2021-04-21 DIAGNOSIS — Z8616 Personal history of COVID-19: Secondary | ICD-10-CM

## 2021-04-21 DIAGNOSIS — K269 Duodenal ulcer, unspecified as acute or chronic, without hemorrhage or perforation: Secondary | ICD-10-CM | POA: Diagnosis present

## 2021-04-21 DIAGNOSIS — K089 Disorder of teeth and supporting structures, unspecified: Secondary | ICD-10-CM | POA: Diagnosis present

## 2021-04-21 DIAGNOSIS — G40909 Epilepsy, unspecified, not intractable, without status epilepticus: Secondary | ICD-10-CM

## 2021-04-21 DIAGNOSIS — J449 Chronic obstructive pulmonary disease, unspecified: Secondary | ICD-10-CM | POA: Diagnosis not present

## 2021-04-21 DIAGNOSIS — Z7901 Long term (current) use of anticoagulants: Secondary | ICD-10-CM

## 2021-04-21 DIAGNOSIS — R269 Unspecified abnormalities of gait and mobility: Secondary | ICD-10-CM | POA: Diagnosis not present

## 2021-04-21 DIAGNOSIS — L97819 Non-pressure chronic ulcer of other part of right lower leg with unspecified severity: Secondary | ICD-10-CM | POA: Diagnosis present

## 2021-04-21 DIAGNOSIS — E1149 Type 2 diabetes mellitus with other diabetic neurological complication: Secondary | ICD-10-CM | POA: Diagnosis not present

## 2021-04-21 DIAGNOSIS — I70263 Atherosclerosis of native arteries of extremities with gangrene, bilateral legs: Secondary | ICD-10-CM | POA: Diagnosis not present

## 2021-04-21 DIAGNOSIS — Z803 Family history of malignant neoplasm of breast: Secondary | ICD-10-CM

## 2021-04-21 DIAGNOSIS — Z8249 Family history of ischemic heart disease and other diseases of the circulatory system: Secondary | ICD-10-CM

## 2021-04-21 DIAGNOSIS — E119 Type 2 diabetes mellitus without complications: Secondary | ICD-10-CM | POA: Diagnosis not present

## 2021-04-21 DIAGNOSIS — G4730 Sleep apnea, unspecified: Secondary | ICD-10-CM | POA: Diagnosis present

## 2021-04-21 DIAGNOSIS — I70245 Atherosclerosis of native arteries of left leg with ulceration of other part of foot: Secondary | ICD-10-CM | POA: Diagnosis not present

## 2021-04-21 DIAGNOSIS — I517 Cardiomegaly: Secondary | ICD-10-CM | POA: Diagnosis not present

## 2021-04-21 DIAGNOSIS — R0682 Tachypnea, not elsewhere classified: Secondary | ICD-10-CM | POA: Diagnosis not present

## 2021-04-21 DIAGNOSIS — Z9119 Patient's noncompliance with other medical treatment and regimen: Secondary | ICD-10-CM | POA: Diagnosis not present

## 2021-04-21 DIAGNOSIS — F17210 Nicotine dependence, cigarettes, uncomplicated: Secondary | ICD-10-CM | POA: Diagnosis present

## 2021-04-21 DIAGNOSIS — G4700 Insomnia, unspecified: Secondary | ICD-10-CM | POA: Diagnosis not present

## 2021-04-21 DIAGNOSIS — F331 Major depressive disorder, recurrent, moderate: Secondary | ICD-10-CM | POA: Diagnosis not present

## 2021-04-21 DIAGNOSIS — K2100 Gastro-esophageal reflux disease with esophagitis, without bleeding: Secondary | ICD-10-CM | POA: Diagnosis present

## 2021-04-21 DIAGNOSIS — I70223 Atherosclerosis of native arteries of extremities with rest pain, bilateral legs: Secondary | ICD-10-CM | POA: Diagnosis not present

## 2021-04-21 DIAGNOSIS — A411 Sepsis due to other specified staphylococcus: Secondary | ICD-10-CM | POA: Diagnosis not present

## 2021-04-21 DIAGNOSIS — Z1623 Resistance to quinolones and fluoroquinolones: Secondary | ICD-10-CM | POA: Diagnosis present

## 2021-04-21 DIAGNOSIS — L03116 Cellulitis of left lower limb: Secondary | ICD-10-CM | POA: Diagnosis present

## 2021-04-21 DIAGNOSIS — J9611 Chronic respiratory failure with hypoxia: Secondary | ICD-10-CM | POA: Diagnosis not present

## 2021-04-21 DIAGNOSIS — K219 Gastro-esophageal reflux disease without esophagitis: Secondary | ICD-10-CM | POA: Diagnosis not present

## 2021-04-21 DIAGNOSIS — N182 Chronic kidney disease, stage 2 (mild): Secondary | ICD-10-CM | POA: Diagnosis not present

## 2021-04-21 DIAGNOSIS — D638 Anemia in other chronic diseases classified elsewhere: Secondary | ICD-10-CM | POA: Diagnosis present

## 2021-04-21 DIAGNOSIS — Z9981 Dependence on supplemental oxygen: Secondary | ICD-10-CM

## 2021-04-21 DIAGNOSIS — D631 Anemia in chronic kidney disease: Secondary | ICD-10-CM | POA: Diagnosis not present

## 2021-04-21 DIAGNOSIS — F5105 Insomnia due to other mental disorder: Secondary | ICD-10-CM | POA: Diagnosis not present

## 2021-04-21 DIAGNOSIS — L98421 Non-pressure chronic ulcer of back limited to breakdown of skin: Secondary | ICD-10-CM | POA: Diagnosis present

## 2021-04-21 DIAGNOSIS — Z7401 Bed confinement status: Secondary | ICD-10-CM | POA: Diagnosis not present

## 2021-04-21 DIAGNOSIS — G459 Transient cerebral ischemic attack, unspecified: Secondary | ICD-10-CM | POA: Diagnosis not present

## 2021-04-21 DIAGNOSIS — I639 Cerebral infarction, unspecified: Secondary | ICD-10-CM | POA: Diagnosis not present

## 2021-04-21 DIAGNOSIS — M6281 Muscle weakness (generalized): Secondary | ICD-10-CM | POA: Diagnosis not present

## 2021-04-21 DIAGNOSIS — R6520 Severe sepsis without septic shock: Secondary | ICD-10-CM | POA: Diagnosis not present

## 2021-04-21 DIAGNOSIS — L03115 Cellulitis of right lower limb: Secondary | ICD-10-CM | POA: Diagnosis present

## 2021-04-21 DIAGNOSIS — Z833 Family history of diabetes mellitus: Secondary | ICD-10-CM

## 2021-04-21 DIAGNOSIS — L8990 Pressure ulcer of unspecified site, unspecified stage: Secondary | ICD-10-CM | POA: Diagnosis present

## 2021-04-21 DIAGNOSIS — E0840 Diabetes mellitus due to underlying condition with diabetic neuropathy, unspecified: Secondary | ICD-10-CM | POA: Diagnosis not present

## 2021-04-21 DIAGNOSIS — I739 Peripheral vascular disease, unspecified: Secondary | ICD-10-CM | POA: Diagnosis not present

## 2021-04-21 LAB — CBC WITH DIFFERENTIAL/PLATELET
Abs Immature Granulocytes: 0.09 10*3/uL — ABNORMAL HIGH (ref 0.00–0.07)
Basophils Absolute: 0 10*3/uL (ref 0.0–0.1)
Basophils Relative: 0 %
Eosinophils Absolute: 0.1 10*3/uL (ref 0.0–0.5)
Eosinophils Relative: 1 %
HCT: 36.9 % (ref 36.0–46.0)
Hemoglobin: 10.8 g/dL — ABNORMAL LOW (ref 12.0–15.0)
Lymphocytes Relative: 10 %
Lymphs Abs: 1.3 10*3/uL (ref 0.7–4.0)
MCH: 28 pg (ref 26.0–34.0)
MCHC: 29.3 g/dL — ABNORMAL LOW (ref 30.0–36.0)
MCV: 95.6 fL (ref 80.0–100.0)
Monocytes Absolute: 1.3 10*3/uL — ABNORMAL HIGH (ref 0.1–1.0)
Monocytes Relative: 10 %
Neutro Abs: 10.1 10*3/uL — ABNORMAL HIGH (ref 1.7–7.7)
Neutrophils Relative %: 79 %
Platelets: 359 10*3/uL (ref 150–400)
RBC: 3.86 MIL/uL — ABNORMAL LOW (ref 3.87–5.11)
RDW: 15.9 % — ABNORMAL HIGH (ref 11.5–15.5)
WBC: 12.8 10*3/uL — ABNORMAL HIGH (ref 4.0–10.5)
nRBC: 0 % (ref 0.0–0.2)

## 2021-04-21 LAB — COMPREHENSIVE METABOLIC PANEL
ALT: 9 U/L (ref 0–44)
AST: 12 U/L — ABNORMAL LOW (ref 15–41)
Albumin: 2.3 g/dL — ABNORMAL LOW (ref 3.5–5.0)
Alkaline Phosphatase: 82 U/L (ref 38–126)
Anion gap: 14 (ref 5–15)
BUN: 13 mg/dL (ref 6–20)
CO2: 27 mmol/L (ref 22–32)
Calcium: 8.7 mg/dL — ABNORMAL LOW (ref 8.9–10.3)
Chloride: 99 mmol/L (ref 98–111)
Creatinine, Ser: 1.33 mg/dL — ABNORMAL HIGH (ref 0.44–1.00)
GFR, Estimated: 47 mL/min — ABNORMAL LOW (ref >60)
Glucose, Bld: 165 mg/dL — ABNORMAL HIGH (ref 70–99)
Potassium: 2.6 mmol/L — CL (ref 3.5–5.1)
Sodium: 140 mmol/L (ref 135–145)
Total Bilirubin: 1 mg/dL (ref 0.3–1.2)
Total Protein: 7.1 g/dL (ref 6.5–8.1)

## 2021-04-21 LAB — RESP PANEL BY RT-PCR (FLU A&B, COVID) ARPGX2
Influenza A by PCR: NEGATIVE
Influenza B by PCR: NEGATIVE
SARS Coronavirus 2 by RT PCR: NEGATIVE

## 2021-04-21 LAB — LACTIC ACID, PLASMA: Lactic Acid, Venous: 1.2 mmol/L (ref 0.5–1.9)

## 2021-04-21 LAB — PROTIME-INR
INR: 1.7 — ABNORMAL HIGH (ref 0.8–1.2)
Prothrombin Time: 19.6 seconds — ABNORMAL HIGH (ref 11.4–15.2)

## 2021-04-21 LAB — GLUCOSE, CAPILLARY
Glucose-Capillary: 148 mg/dL — ABNORMAL HIGH (ref 70–99)
Glucose-Capillary: 148 mg/dL — ABNORMAL HIGH (ref 70–99)

## 2021-04-21 LAB — MAGNESIUM: Magnesium: 1.5 mg/dL — ABNORMAL LOW (ref 1.7–2.4)

## 2021-04-21 MED ORDER — LORAZEPAM 0.5 MG PO TABS
0.5000 mg | ORAL_TABLET | Freq: Three times a day (TID) | ORAL | Status: DC | PRN
  Administered 2021-04-22 – 2021-04-28 (×7): 0.5 mg via ORAL
  Filled 2021-04-21 (×8): qty 1

## 2021-04-21 MED ORDER — MAGNESIUM SULFATE 4 GM/100ML IV SOLN
4.0000 g | Freq: Once | INTRAVENOUS | Status: AC
  Administered 2021-04-21: 4 g via INTRAVENOUS
  Filled 2021-04-21: qty 100

## 2021-04-21 MED ORDER — ACETAMINOPHEN 325 MG PO TABS
650.0000 mg | ORAL_TABLET | Freq: Four times a day (QID) | ORAL | Status: DC | PRN
  Administered 2021-04-21 – 2021-05-26 (×8): 650 mg via ORAL
  Filled 2021-04-21 (×10): qty 2

## 2021-04-21 MED ORDER — ONDANSETRON HCL 4 MG PO TABS: 4.0000 mg | ORAL_TABLET | Freq: Four times a day (QID) | ORAL | Status: DC | PRN

## 2021-04-21 MED ORDER — OXYBUTYNIN CHLORIDE ER 5 MG PO TB24
5.0000 mg | ORAL_TABLET | Freq: Every day | ORAL | Status: DC
  Administered 2021-04-21 – 2021-05-22 (×30): 5 mg via ORAL
  Filled 2021-04-21 (×36): qty 1

## 2021-04-21 MED ORDER — POTASSIUM CHLORIDE 10 MEQ/100ML IV SOLN
10.0000 meq | INTRAVENOUS | Status: AC
  Administered 2021-04-21 (×3): 10 meq via INTRAVENOUS
  Filled 2021-04-21 (×3): qty 100

## 2021-04-21 MED ORDER — HEPARIN SODIUM (PORCINE) 5000 UNIT/ML IJ SOLN
5000.0000 [IU] | Freq: Three times a day (TID) | INTRAMUSCULAR | Status: DC
  Administered 2021-04-21 – 2021-04-22 (×3): 5000 [IU] via SUBCUTANEOUS
  Filled 2021-04-21 (×3): qty 1

## 2021-04-21 MED ORDER — ROSUVASTATIN CALCIUM 5 MG PO TABS
10.0000 mg | ORAL_TABLET | Freq: Every day | ORAL | Status: DC
  Administered 2021-04-21 – 2021-05-27 (×34): 10 mg via ORAL
  Filled 2021-04-21 (×19): qty 2
  Filled 2021-04-21: qty 1
  Filled 2021-04-21 (×18): qty 2

## 2021-04-21 MED ORDER — MORPHINE SULFATE (PF) 4 MG/ML IV SOLN
4.0000 mg | Freq: Once | INTRAVENOUS | Status: AC
  Administered 2021-04-21: 4 mg via INTRAVENOUS
  Filled 2021-04-21: qty 1

## 2021-04-21 MED ORDER — AMLODIPINE BESYLATE 10 MG PO TABS
10.0000 mg | ORAL_TABLET | Freq: Every day | ORAL | Status: DC
  Administered 2021-04-21 – 2021-05-28 (×37): 10 mg via ORAL
  Filled 2021-04-21 (×9): qty 1
  Filled 2021-04-21: qty 2
  Filled 2021-04-21 (×30): qty 1

## 2021-04-21 MED ORDER — ACETAMINOPHEN 650 MG RE SUPP: 650.0000 mg | Freq: Four times a day (QID) | RECTAL | Status: DC | PRN

## 2021-04-21 MED ORDER — SODIUM CHLORIDE 0.9 % IV SOLN
2.0000 g | Freq: Two times a day (BID) | INTRAVENOUS | Status: DC
  Administered 2021-04-21 – 2021-04-24 (×6): 2 g via INTRAVENOUS
  Filled 2021-04-21 (×6): qty 2

## 2021-04-21 MED ORDER — TRAZODONE HCL 50 MG PO TABS
50.0000 mg | ORAL_TABLET | Freq: Every day | ORAL | Status: DC
  Administered 2021-04-21 – 2021-05-29 (×34): 50 mg via ORAL
  Filled 2021-04-21 (×38): qty 1

## 2021-04-21 MED ORDER — VANCOMYCIN HCL 1250 MG/250ML IV SOLN
1250.0000 mg | INTRAVENOUS | Status: DC
  Administered 2021-04-22 – 2021-04-23 (×2): 1250 mg via INTRAVENOUS
  Filled 2021-04-21 (×3): qty 250

## 2021-04-21 MED ORDER — INSULIN ASPART 100 UNIT/ML IJ SOLN
0.0000 [IU] | Freq: Every day | INTRAMUSCULAR | Status: DC
  Administered 2021-05-14: 3 [IU] via SUBCUTANEOUS
  Administered 2021-05-20: 2 [IU] via SUBCUTANEOUS
  Administered 2021-05-21: 4 [IU] via SUBCUTANEOUS
  Administered 2021-05-22: 2 [IU] via SUBCUTANEOUS
  Administered 2021-05-27: 3 [IU] via SUBCUTANEOUS

## 2021-04-21 MED ORDER — MAGNESIUM SULFATE 2 GM/50ML IV SOLN: 2.0000 g | Freq: Once | INTRAVENOUS | Status: DC

## 2021-04-21 MED ORDER — SODIUM CHLORIDE 0.9 % IV SOLN
2.0000 g | Freq: Once | INTRAVENOUS | Status: AC
  Administered 2021-04-21: 2 g via INTRAVENOUS
  Filled 2021-04-21: qty 2

## 2021-04-21 MED ORDER — VANCOMYCIN HCL 1500 MG/300ML IV SOLN
1500.0000 mg | Freq: Once | INTRAVENOUS | Status: AC
  Administered 2021-04-21: 1500 mg via INTRAVENOUS
  Filled 2021-04-21: qty 300

## 2021-04-21 MED ORDER — LEVETIRACETAM 750 MG PO TABS
750.0000 mg | ORAL_TABLET | Freq: Two times a day (BID) | ORAL | Status: DC
  Administered 2021-04-21 – 2021-05-26 (×65): 750 mg via ORAL
  Filled 2021-04-21 (×72): qty 1

## 2021-04-21 MED ORDER — INSULIN ASPART 100 UNIT/ML IJ SOLN
3.0000 [IU] | Freq: Three times a day (TID) | INTRAMUSCULAR | Status: DC
  Administered 2021-04-22 – 2021-05-23 (×24): 3 [IU] via SUBCUTANEOUS

## 2021-04-21 MED ORDER — SODIUM CHLORIDE 0.9 % IV BOLUS
30.0000 mL/kg | Freq: Once | INTRAVENOUS | Status: AC
  Administered 2021-04-21: 2505 mL via INTRAVENOUS

## 2021-04-21 MED ORDER — ONDANSETRON HCL 4 MG/2ML IJ SOLN: 4.0000 mg | Freq: Four times a day (QID) | INTRAMUSCULAR | Status: DC | PRN

## 2021-04-21 MED ORDER — METOPROLOL TARTRATE 50 MG PO TABS
50.0000 mg | ORAL_TABLET | Freq: Two times a day (BID) | ORAL | Status: DC
  Administered 2021-04-21 – 2021-05-24 (×60): 50 mg via ORAL
  Filled 2021-04-21 (×67): qty 1

## 2021-04-21 MED ORDER — PANTOPRAZOLE SODIUM 40 MG PO TBEC
40.0000 mg | DELAYED_RELEASE_TABLET | Freq: Every day | ORAL | Status: DC
  Administered 2021-04-21 – 2021-05-27 (×35): 40 mg via ORAL
  Filled 2021-04-21 (×39): qty 1

## 2021-04-21 MED ORDER — OXYCODONE-ACETAMINOPHEN 5-325 MG PO TABS
1.0000 | ORAL_TABLET | Freq: Once | ORAL | Status: AC
  Administered 2021-04-21: 1 via ORAL
  Filled 2021-04-21: qty 1

## 2021-04-21 MED ORDER — OXYCODONE HCL 5 MG PO TABS
5.0000 mg | ORAL_TABLET | Freq: Four times a day (QID) | ORAL | Status: DC | PRN
  Administered 2021-04-21 – 2021-04-22 (×2): 5 mg via ORAL
  Filled 2021-04-21 (×2): qty 1

## 2021-04-21 MED ORDER — TIOTROPIUM BROMIDE MONOHYDRATE 18 MCG IN CAPS: 18.0000 ug | ORAL_CAPSULE | Freq: Every day | RESPIRATORY_TRACT | Status: DC | PRN

## 2021-04-21 MED ORDER — UMECLIDINIUM BROMIDE 62.5 MCG/INH IN AEPB
1.0000 | INHALATION_SPRAY | Freq: Every day | RESPIRATORY_TRACT | Status: DC
  Administered 2021-04-22: 1 via RESPIRATORY_TRACT
  Filled 2021-04-21: qty 7

## 2021-04-21 MED ORDER — DULOXETINE HCL 60 MG PO CPEP
60.0000 mg | ORAL_CAPSULE | Freq: Every day | ORAL | Status: DC
  Administered 2021-04-21 – 2021-05-09 (×18): 60 mg via ORAL
  Filled 2021-04-21 (×10): qty 1
  Filled 2021-04-21: qty 2
  Filled 2021-04-21 (×8): qty 1

## 2021-04-21 MED ORDER — POTASSIUM CHLORIDE CRYS ER 20 MEQ PO TBCR
40.0000 meq | EXTENDED_RELEASE_TABLET | Freq: Once | ORAL | Status: AC
  Administered 2021-04-21: 40 meq via ORAL
  Filled 2021-04-21: qty 2

## 2021-04-21 MED ORDER — INSULIN ASPART 100 UNIT/ML IJ SOLN
0.0000 [IU] | Freq: Three times a day (TID) | INTRAMUSCULAR | Status: DC
  Administered 2021-04-21 – 2021-04-22 (×2): 1 [IU] via SUBCUTANEOUS
  Administered 2021-04-23: 2 [IU] via SUBCUTANEOUS
  Administered 2021-04-24 (×2): 1 [IU] via SUBCUTANEOUS
  Administered 2021-04-25: 2 [IU] via SUBCUTANEOUS
  Administered 2021-04-26: 3 [IU] via SUBCUTANEOUS
  Administered 2021-04-27: 1 [IU] via SUBCUTANEOUS
  Administered 2021-04-28: 2 [IU] via SUBCUTANEOUS
  Administered 2021-04-28 – 2021-04-30 (×2): 1 [IU] via SUBCUTANEOUS
  Administered 2021-05-03 (×2): 3 [IU] via SUBCUTANEOUS
  Administered 2021-05-03 – 2021-05-05 (×2): 1 [IU] via SUBCUTANEOUS
  Administered 2021-05-05 – 2021-05-07 (×3): 2 [IU] via SUBCUTANEOUS
  Administered 2021-05-08 – 2021-05-09 (×2): 1 [IU] via SUBCUTANEOUS
  Administered 2021-05-10: 9 [IU] via SUBCUTANEOUS
  Administered 2021-05-11: 2 [IU] via SUBCUTANEOUS
  Administered 2021-05-12: 1 [IU] via SUBCUTANEOUS
  Administered 2021-05-13 – 2021-05-14 (×2): 2 [IU] via SUBCUTANEOUS
  Administered 2021-05-14: 1 [IU] via SUBCUTANEOUS
  Administered 2021-05-14: 7 [IU] via SUBCUTANEOUS
  Administered 2021-05-15: 2 [IU] via SUBCUTANEOUS
  Administered 2021-05-15: 5 [IU] via SUBCUTANEOUS
  Administered 2021-05-15: 2 [IU] via SUBCUTANEOUS
  Administered 2021-05-16: 3 [IU] via SUBCUTANEOUS
  Administered 2021-05-17: 1 [IU] via SUBCUTANEOUS
  Administered 2021-05-17: 3 [IU] via SUBCUTANEOUS
  Administered 2021-05-18: 1 [IU] via SUBCUTANEOUS
  Administered 2021-05-18: 3 [IU] via SUBCUTANEOUS
  Administered 2021-05-18 – 2021-05-19 (×4): 2 [IU] via SUBCUTANEOUS
  Administered 2021-05-20: 7 [IU] via SUBCUTANEOUS
  Administered 2021-05-20: 2 [IU] via SUBCUTANEOUS
  Administered 2021-05-20: 5 [IU] via SUBCUTANEOUS
  Administered 2021-05-22: 2 [IU] via SUBCUTANEOUS
  Administered 2021-05-22 – 2021-05-23 (×2): 3 [IU] via SUBCUTANEOUS
  Administered 2021-05-23: 7 [IU] via SUBCUTANEOUS
  Administered 2021-05-24: 3 [IU] via SUBCUTANEOUS
  Administered 2021-05-24: 5 [IU] via SUBCUTANEOUS
  Administered 2021-05-25 (×2): 1 [IU] via SUBCUTANEOUS
  Administered 2021-05-27 (×2): 3 [IU] via SUBCUTANEOUS
  Administered 2021-05-28: 5 [IU] via SUBCUTANEOUS
  Administered 2021-05-28: 3 [IU] via SUBCUTANEOUS
  Administered 2021-05-28: 7 [IU] via SUBCUTANEOUS
  Administered 2021-05-29: 1 [IU] via SUBCUTANEOUS
  Administered 2021-05-29: 2 [IU] via SUBCUTANEOUS

## 2021-04-21 MED ORDER — ZINC SULFATE 220 (50 ZN) MG PO CAPS
220.0000 mg | ORAL_CAPSULE | Freq: Every day | ORAL | Status: DC
  Administered 2021-04-21 – 2021-05-27 (×35): 220 mg via ORAL
  Filled 2021-04-21 (×38): qty 1

## 2021-04-21 NOTE — Progress Notes (Signed)
Pt refusing all turns and repositioning. Yells at nursing staff when attempting to turn or reposition.

## 2021-04-21 NOTE — ED Triage Notes (Signed)
Pt here from [facility] for wound recheck.

## 2021-04-21 NOTE — ED Notes (Signed)
Date and time results received: 04/21/21 10:55 AM  (use smartphrase ".now" to insert current time)  Test: K+ Critical Value: 2.6  Name of Provider Notified: Ileene Patrick, [place]  Orders Received? Or Actions Taken?: see chart
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
## 2021-04-21 NOTE — ED Notes (Signed)
Pt in bed, care link at bedside, report to care link, pt has pure wick in place, pt states that she is dry and doesn't need to be changed, pt states that she doesn't want any dressings on her legs, asked pt if she wanted to put on the gown, pt states that she is happy with the sheet and only wants the gown draped on her.

## 2021-04-21 NOTE — H&P (Signed)
History and Physical    MEGYN LENG RJJ:[id number] DOB: 01-Jul-1966 DOA: 04/21/2021  Referring MD/NP/PA: Ileene Patrick PCP: Caprice Renshaw, MD   Patient coming from: [place]  Chief Complaint: Bilateral leg pain  HPI: CARRERA KIESEL is a 55 y.o. female with history of tobacco use disorder, severe PVD, paroxysmal atrial fibrillation currently on anticoagulant, hyperlipidemia, hypertension, uncontrolled T2DM, CHF, CKD, and more presents to the ED with a chief complaint of bilateral leg pain. Has no other complaints, no change in bowel habits. No change in urinary habits. Denies dyspnea and cough. Wears 2L oxygen at baseline.  Son reported that the nursing facility at [facility] explained that patient has had decreased PO and had not been ambulating, though this was her baseline. Son wanted patient here to be assessed and following up with orthopedic and vascular recommendations to have her legs amputated.   Chart review showed that patient was recently admitted to the hospital on 6/07 and discharged 6/10 due to sepsis 2/2 secondary to infected pressure ulcers on the bilateral lower extremities. She refused IV antibiotics after day 3 and was discharged home on doxycycline. To this, she states that she wanted to hasten discharge process due to "wanting to smoke a cigarette." Subsequently, she followed up with orthopedic surgery (Dr. Sharol Given) and vascular surgery (Dr. Oneida Alar) who both recommended bilateral amputation as she is not candidate for revascularization.   In the ED Temp 98.1, HR on monitor 150, though pulse feels like 90, RR 22, BP 116/92, Sating 83% upon admission, though 100% on 3L [place] Blood culture ordered, pending to evaluate for bacteremia Imaging - Portable chest xray shows cardiomegaly noted as last admission. Otherwise, unremarkable.  EKG showed an irregularly irregular rate and rhythm, tachycardia 120-160 on monitor. On telemetry, patient appeared to be in Afib WBC 12.8 RBC count 3.86, Hgb  10.8 up from last discharge (9.2 on 6/9)  Chemistry panel reveals an elevated Cr that is at baseline, glucose, K 2.6, and Mg 1.5, Cr 1.33, venous lactic acid 1.2  Patient received potassium IV and oral, 2g IV Mag Cefepime 2g initially given  Ultrasound guided PIVs were placed, and PICC line was ordered for administration of antibiotics, electrolyte repletion, and fluids   Review of Systems:  In addition to the HPI above,  No Fever-chills, No Headache, No changes with Vision or hearing, No problems swallowing food or Liquids, No Chest pain, Cough or Shortness of Breath, Slight abdominal pain  No Blood in stool or Urine, No dysuria, Admits to hip pain following fall No new weakness, tingling, numbness in any extremity, No recent weight gain or loss, No polyuria, polydypsia or polyphagia, No significant Mental Stressors. No other pertinent ROS.    All other system are negative Past Medical History:  Diagnosis Date   Alcohol use    Ankle fracture, lateral malleolus, closed 2013   Anxiety    Aortic atherosclerosis ([place]) 03/10/2021   Breast mass, left 2013   CHF (congestive heart failure) ([place])    a. EF 20-25% by echo in 05/2016 with cath showing normal cors b. EF 50-55% in 07/2020 c. 01/2021: EF at 55-60% with moderate LVH   Chronic anemia    CKD (chronic kidney disease), stage III (HCC)    Cocaine abuse (HCC)    COPD (chronic obstructive pulmonary disease) ([place])    Diabetes mellitus, type 2 (HCC)    Diabetic Charcot foot ([place])    Essential hypertension    History of cardiomyopathy    History of GI bleed  2011   Hyperlipidemia    Noncompliance    Obesity    PAF (paroxysmal atrial fibrillation) (HCC)    Panic attacks    PAT (paroxysmal atrial tachycardia) (HCC)    Previously on Amiodarone   Seizures ([place]) 10/01/2020   Sleep apnea    Not on CPAP   Stroke ([place]) 2018   Tobacco abuse    Urinary incontinence     Past Surgical History:  Procedure Laterality Date   BIOPSY   11/12/2020   Procedure: BIOPSY;  Surgeon: Harvel Quale, MD;  [facility];  Service: Gastroenterology;;   BREAST BIOPSY     CARDIAC CATHETERIZATION N/A 07/28/2016   Procedure: Left Heart Cath and Coronary Angiography;  Surgeon: Jettie Booze, MD;  Location: [place] [facility];  Service: Cardiovascular;  Laterality: N/A;   COLONOSCOPY N/A 05/10/2019   Procedure: COLONOSCOPY;  Surgeon: Danie Binder, MD;  [facility];  Service: Endoscopy;  Laterality: N/A;  Phenergan 12.5 mg IV in pre-op   DILATION AND CURETTAGE OF UTERUS     ESOPHAGOGASTRODUODENOSCOPY (EGD) WITH PROPOFOL N/A 11/12/2020   Procedure: ESOPHAGOGASTRODUODENOSCOPY (EGD) WITH PROPOFOL;  Surgeon: Harvel Quale, MD;  [facility];  Service: Gastroenterology;  Laterality: N/A;   I & D EXTREMITY Bilateral 09/22/2017   Procedure: BILATERAL DEBRIDEMENT LEG/FOOT ULCERS, APPLY VERAFLO WOUND VAC;  Surgeon: Newt Minion, MD;  Location: [place];  Service: Orthopedics;  Laterality: Bilateral;   I & D EXTREMITY Right 10/11/2018   Procedure: IRRIGATION AND DEBRIDEMENT RIGHT HAND;  Surgeon: [name], MD;  Location: [place];  Service: Orthopedics;  Laterality: Right;   I & D EXTREMITY Right 10/13/2018   Procedure: REPEAT IRRIGATION AND DEBRIDEMENT RIGHT HAND;  Surgeon: [name], MD;  Location: [place];  Service: Orthopedics;  Laterality: Right;   I & D EXTREMITY Right 11/22/2018   Procedure: IRRIGATION AND DEBRIDEMENT AND PINNING RIGHT HAND;  Surgeon: [name], MD;  Location: [place];  Service: Orthopedics;  Laterality: Right;   IR RADIOLOGIST EVAL & MGMT  07/05/2018   POLYPECTOMY  05/10/2019   Procedure: POLYPECTOMY;  Surgeon: Danie Binder, MD;  [facility];  Service: Endoscopy;;   SKIN SPLIT GRAFT Bilateral 09/28/2017   Procedure: BILATERAL SPLIT THICKNESS SKIN GRAFT LEGS/FEET AND APPLY VAC;  Surgeon: Newt Minion, MD;  Location: [place];  Service: Orthopedics;   Laterality: Bilateral;   SKIN SPLIT GRAFT Right 11/22/2018   Procedure: SKIN GRAFT SPLIT THICKNESS;  Surgeon: [name], MD;  Location: [place];  Service: Orthopedics;  Laterality: Right;     reports that she has been smoking cigarettes. She has a 5.00 pack-year smoking history. She has never used smokeless tobacco. She reports previous alcohol use. She reports previous drug use. Frequency: 3.00 times per week. Drugs: Marijuana and Cocaine.  No Known Allergies  Family History  Problem Relation Age of Onset   Hypertension Mother    Heart attack Mother    Hypertension Father        CABG    Hypertension Sister    Hypertension Brother    Hypertension Sister    Cancer Sister        breast    Arthritis Other    Cancer Other    Diabetes Other    Asthma Other    Hypertension Daughter    Hypertension Son     Prior to Admission medications   Medication Sig Start Date End Date Taking? Authorizing Provider  acetaminophen (TYLENOL) 325 MG tablet Take 2 tablets (650 mg total) by mouth every 6 (six) hours as needed for headache, fever or mild pain. 11/04/20  Yes Arlean Thies L, MD  Alogliptin Benzoate 25 MG TABS Take 1 tablet by mouth daily. 04/20/21  Yes [provider]  amLODipine (NORVASC) 10 MG tablet Take 1 tablet (10 mg total) by mouth daily. 12/07/20  Yes Emokpae, Courage, MD  apixaban (ELIQUIS) 5 MG TABS tablet Take 1 tablet (5 mg total) by mouth 2 (two) times daily. Okay to restart Eliquis on 11/17/2020 03/27/21  Yes Shahmehdi, Seyed A, MD  DULoxetine (CYMBALTA) 60 MG capsule Take 1 capsule (60 mg total) by mouth daily. 12/07/20  Yes Roxan Hockey, MD  furosemide (LASIX) 40 MG tablet Take 0.5 tablets (20 mg total) by mouth every Tuesday, Thursday, Saturday, and Sunday. 03/27/21  Yes Shahmehdi, Valeria Batman, MD  HUMALOG KWIKPEN 100 UNIT/ML KwikPen Inject 1-10 Units into the skin See admin instructions. 151-200 =1u, 201-250= 2u, 251-300= 4u, 301-350= 6u, 351-400= 8u, 401-450= 10u  03/28/21  Yes [provider]  levETIRAcetam (KEPPRA) 750 MG tablet Take 1 tablet (750 mg total) by mouth 2 (two) times daily. 01/30/21  Yes Suzzanne Cloud, NP  LORazepam (ATIVAN) 0.5 MG tablet Take 1 tablet (0.5 mg total) by mouth 3 (three) times daily as needed for anxiety. 04/04/21  Yes Lendell Gallick L, MD  metoprolol tartrate (LOPRESSOR) 50 MG tablet Take 1 tablet (50 mg total) by mouth 2 (two) times daily. 04/04/21  Yes Rodriquez Thorner L, MD  omeprazole (PRILOSEC) 20 MG capsule Take 20 mg by mouth daily.   Yes [provider]  oxybutynin (DITROPAN-XL) 5 MG 24 hr tablet Take 5 mg by mouth at bedtime.   Yes [provider]  oxyCODONE (OXY IR/ROXICODONE) 5 MG immediate release tablet Take 5 mg by mouth every 8 (eight) hours as needed for severe pain or moderate pain.   Yes [provider]  rosuvastatin (CRESTOR) 10 MG tablet Take 10 mg by mouth daily.   Yes [provider]  silver sulfADIAZINE (SILVADENE) 1 % cream Apply 1 application topically daily.   Yes [provider]  tiotropium (SPIRIVA HANDIHALER) 18 MCG inhalation capsule Place 18 mcg into inhaler and inhale daily as needed (Shortness of breath).   Yes [provider]  traZODone (DESYREL) 50 MG tablet Take 1 tablet (50 mg total) by mouth at bedtime. 12/07/20  Yes Emokpae, Courage, MD  TRULICITY 3 XT/0.6YI SOPN Inject 3 mg into the skin See admin instructions. Inject 0.5 mls subcutaneously one time a day every Friday 03/11/21  Yes [provider]  Zinc Oxide 10 % AERO Apply 1 application topically daily.   Yes [provider]  zinc sulfate 220 (50 Zn) MG capsule Take 1 capsule (220 mg total) by mouth daily. 12/08/20  Yes Emokpae, Courage, MD  albuterol (PROAIR HFA) 108 (90 Base) MCG/ACT inhaler INHALE 2 PUFFS EVERY 6 HOURS AS NEEDED FOR SHORTNESS OF BREATH/WHEEZING. Patient not taking: Reported on 04/21/2021 08/05/20 04/21/21  [name], MD    Physical  Exam: Vitals:   04/21/21 1333 04/21/21 1400 04/21/21 1430 04/21/21 1446  BP: 132/76 124/82 (!) 108/95 (!) 116/92  Pulse: 82 94  96  Resp: '18 12 20 16  ' Temp:    98.1 F (36.7 C)  TempSrc:    Oral  SpO2: 100%   100%       Vitals:   04/21/21 1333 04/21/21 1400 04/21/21 1430 04/21/21 1446  BP: 132/76 124/82 (!) 108/95 (!) 116/92  Pulse: 82 94  96  Resp: '18 12 20 16  ' Temp:    98.1 F (36.7 C)  TempSrc:    Oral  SpO2: 100%   100%   Constitutional: Obese middle-aged woman lying in bed, deconditioned state, wearing [place], easily agitated at times.  HENT:  Head: Normocephalic and atraumatic  Nose: no congestion  Eyes: conjunctiva/sclera: conjunctivae normal  Respiratory:   Effort: no respiratory distress  Breath sounds: No wheezing, rhonchi, or rales Cardiovascular:  Rate and rhythm: tachycardia present, irregular rhythm   Pulses: normal rate  Heart sounds: no murmur heard No friction rubs or gallops appreciated  Abdomen: no tenderness, no masses palpated. No hepatosplenomegaly. Bowel sounds positive.  Musculoskeletal: no clubbing / cyanosis. Patient had full range of motion in the upper lower extremities, neurovascular intact Skin:   General: skin is warm, dry  Patient had noted pressure ulcers on the ventral side of distal lower extremities, stage IV pressure ulcers with necrotic skin, though without discharge noted. No foul odor appreciated. Please refer to photos referred in Dr. Maurine Cane notes for full detail.  Neurologic:   Alert, conversing appropriately.  Psychiatric: Mood and affect appropriate.   Labs on Admission: I have personally reviewed following labs and imaging studies  CBC: Recent Labs  Lab 04/21/21 1019  WBC 12.8*  NEUTROABS 10.1*  HGB 10.8*  HCT 36.9  MCV 95.6  PLT 956   Basic Metabolic Panel: Recent Labs  Lab 04/21/21 1019  NA 140  K 2.6*  CL 99  CO2 27  GLUCOSE 165*  BUN 13  CREATININE 1.33*  CALCIUM 8.7*  MG 1.5*  TEST WILL BE  CREDITED   GFR: Estimated Creatinine Clearance: 46.3 mL/min (A) (by C-G formula based on SCr of 1.33 mg/dL (H)). Liver Function Tests: Recent Labs  Lab 04/21/21 1019  AST 12*  ALT 9  ALKPHOS 82  BILITOT 1.0  PROT 7.1  ALBUMIN 2.3*   No results for input(s): LIPASE, AMYLASE in the last 168 hours. No results for input(s): AMMONIA in the last 168 hours. Coagulation Profile: Recent Labs  Lab 04/21/21 1019  INR 1.7*   Cardiac Enzymes: No results for input(s): CKTOTAL, CKMB, CKMBINDEX, TROPONINI in the last 168 hours. BNP (last 3 results) No results for input(s): PROBNP in the last 8760 hours. HbA1C: No results for input(s): HGBA1C in the last 72 hours. CBG: No results for input(s): GLUCAP in the last 168 hours. Lipid Profile: No results for input(s): CHOL, HDL, LDLCALC, TRIG, CHOLHDL, LDLDIRECT in the last 72 hours. Thyroid Function Tests: No results for input(s): TSH, T4TOTAL, FREET4, T3FREE, THYROIDAB in the last 72 hours. Anemia Panel: No results for input(s): VITAMINB12, FOLATE, FERRITIN, TIBC, IRON, RETICCTPCT in the last 72 hours. Urine analysis:    Component Value Date/Time   COLORURINE STRAW (A) 04/01/2021 1732   APPEARANCEUR CLEAR 04/01/2021 1732   LABSPEC 1.008 04/01/2021 1732   PHURINE 6.0 04/01/2021 1732   GLUCOSEU 50 (A) 04/01/2021 1732   HGBUR NEGATIVE 04/01/2021 1732   BILIRUBINUR NEGATIVE 04/01/2021 1732   KETONESUR NEGATIVE 04/01/2021 1732   PROTEINUR 100 (A) 04/01/2021 1732   UROBILINOGEN 0.2 09/11/2014 1319   NITRITE NEGATIVE 04/01/2021 1732   LEUKOCYTESUR SMALL (A) 04/01/2021 1732   Sepsis Labs:  Recent Results (from the past 240 hour(s))  Blood culture (routine x 2)     Status: None (Preliminary result)   Collection Time: 04/21/21 10:19 AM   Specimen: Vein; Blood  Result  Value Ref Range Status   Specimen Description BLOOD RIGHT ANTECUBITAL  Final   Special Requests   Final    BOTTLES DRAWN AEROBIC AND ANAEROBIC Blood Culture adequate  volume Performed at [facility], [address], [place], [place] [address]    Culture PENDING  Incomplete   Report Status PENDING  Incomplete  Blood culture (routine x 2)     Status: None (Preliminary result)   Collection Time: 04/21/21 10:19 AM   Specimen: Vein; Blood  Result Value Ref Range Status   Specimen Description BLOOD BLOOD RIGHT ARM  Final   Special Requests   Final    BOTTLES DRAWN AEROBIC AND ANAEROBIC Blood Culture adequate volume Performed at [facility] [address] [place], [place] [address]    Culture PENDING  Incomplete   Report Status PENDING  Incomplete  Resp Panel by RT-PCR (Flu A&B, Covid) Nasopharyngeal Swab     Status: None   Collection Time: 04/21/21 10:41 AM   Specimen: Nasopharyngeal Swab; Nasopharyngeal(NP) swabs in vial transport medium  Result Value Ref Range Status   SARS Coronavirus 2 by RT PCR NEGATIVE NEGATIVE Final    Comment: (NOTE) SARS-CoV-2 target nucleic acids are NOT DETECTED.  The SARS-CoV-2 RNA is generally detectable in upper respiratory specimens during the acute phase of infection. The lowest concentration of SARS-CoV-2 viral copies this assay can detect is 138 copies/mL. A negative result does not preclude SARS-Cov-2 infection and should not be used as the sole basis for treatment or other patient management decisions. A negative result may occur with  improper specimen collection/handling, submission of specimen other than nasopharyngeal swab, presence of viral mutation(s) within the areas targeted by this assay, and inadequate number of viral copies(<138 copies/mL). A negative result must be combined with clinical observations, patient history, and epidemiological information. The expected result is Negative.  Fact Sheet for Patients:  EntrepreneurPulse.com.au  Fact Sheet for Healthcare Providers:  IncredibleEmployment.be  This test is no t yet approved or cleared by the [place]  FDA and  has been authorized for detection and/or diagnosis of SARS-CoV-2 by FDA under an Emergency Use Authorization (EUA). This EUA will remain  in effect (meaning this test can be used) for the duration of the COVID-19 declaration under Section 564(b)(1) of the Act, 21 U.S.C.section 360bbb-3(b)(1), unless the authorization is terminated  or revoked sooner.       Influenza A by PCR NEGATIVE NEGATIVE Final   Influenza B by PCR NEGATIVE NEGATIVE Final    Comment: (NOTE) The Xpert Xpress SARS-CoV-2/FLU/RSV plus assay is intended as an aid in the diagnosis of influenza from Nasopharyngeal swab specimens and should not be used as a sole basis for treatment. Nasal washings and aspirates are unacceptable for Xpert Xpress SARS-CoV-2/FLU/RSV testing.  Fact Sheet for Patients: EntrepreneurPulse.com.au  Fact Sheet for Healthcare Providers: IncredibleEmployment.be  This test is not yet approved or cleared by the [place] FDA and has been authorized for detection and/or diagnosis of SARS-CoV-2 by FDA under an Emergency Use Authorization (EUA). This EUA will remain in effect (meaning this test can be used) for the duration of the COVID-19 declaration under Section 564(b)(1) of the Act, 21 U.S.C. section 360bbb-3(b)(1), unless the authorization is terminated or revoked.  Performed at [facility], [address] [place], [place] [address]      Radiological Exams on Admission: DG Chest Port 1 View  Result Date: 04/21/2021 CLINICAL DATA:  Tachycardia and tachypnea EXAM: PORTABLE CHEST 1 VIEW COMPARISON:  Mar 09, 2021  FINDINGS: No edema or airspace opacity. Cardiomegaly again noted with pulmonary vascularity within normal limits. No adenopathy. No bone lesions. IMPRESSION: Cardiomegaly again noted.  No edema or airspace opacity. Electronically Signed   By: Lowella Grip III M.D.   On: 04/21/2021 09:50   [name] EKG SITE RITE  Result Date:  04/21/2021 If Site Rite image not attached, placement could not be confirmed due to current cardiac rhythm.   EKG: Independently reviewed. Tachycardic, irregularly irregular rhythm, concerning for afib  Assessment/Plan Principal Problem:   Sepsis ([place]) Active Problems:   Essential hypertension   INSOMNIA   Medical non-compliance   Poor mobility   Lower extremity weakness   HTN (hypertension), malignant   Hypokalemia   Diabetic neuropathy (HCC)   CKD (chronic kidney disease) stage 3, GFR 30-59 ml/min (HCC)   Severe recurrent major depression without psychotic features (HCC)   COPD (chronic obstructive pulmonary disease) (HCC)   At high risk for falls   PAF (paroxysmal atrial fibrillation) (HCC)   Atrial fibrillation with RVR (HCC)   Seizures (HCC)   Sacral ulcer, limited to breakdown of skin (HCC)   Reflux esophagitis   Multiple duodenal ulcers   Chronic respiratory failure with hypoxia (HCC)   Hypomagnesemia  #Sepsis secondary to bilateral infected lower leg ulceration and surrounding cellulitis-likely due to cellulitis 2/2 severe PVD. Patient met SIRS criteria with tachycardia and tachypnea. Lactate and temp were within normal limits  - WBC 12.8 - Increased RR, HR upon admission - Lactate and temp within normal limits - Present source of infection (PVD ulcers) - PICC placement for administration of  medications, antibiotics, electrolytes, and fluids - Cefepime 2g today - Vanc 1250 mg q24h IV tomorrow - Lopressor 56m BID - received one dose at 13:22 which seemed to stabilize HR  #PVD - bilateral involvement with stage 4 ulcers [facility]ol Givenper ortho and vascular recs; surgery scheduled 04/21/2021 - orders written for RN to notify Dr. [facility]   #T2DM - Last Hgb A1c 11.1 - Glucose 168 at admission - Continue to monitor CBGs  - Continue SSI  #Chronic respiratory failure - Satting 100% on 3L  [place] - Will return to baseline O2 requirement 2L tomorrow if continuing to saturate appropriately  #PAF - In anticipation of surgery in two days, holding Eliquis (apixaban) temporarily - Continue metoprolol for rate control   #HTN  - Continue lopressor and novasc  #HLD - Continue rosuvastatin 165m  #Hypokalemia - K 2.6 - Replete with oral and IV potassium - Trend - Multimodal pain management with tylenol and oxycodone prn  #Hypomagnesemia  - Mg 1.5  - Replete with 4g mag IV - trend and check Phos  DVT prophylaxis: SQ heparin, hope Eliquis in preparation of surgery Code Status: Full code Family Communication: discussed plan of care with patient at bedside and she verbalized understanding.  Disposition Plan:  Consults called: Orthopedic surgery Admission status: Inpatient until transfer   Severity of Illness: The appropriate patient status for this patient is INPATIENT. Inpatient status is judged to be reasonable and necessary in order to provide the required intensity of service to ensure the patient's safety. The patient's presenting symptoms, physical exam findings, and initial radiographic and laboratory data in the context of their chronic comorbidities is felt to place them at high risk for further clinical deterioration. Furthermore, it is not anticipated that the patient will be medically stable for discharge from  the hospital within 2 midnights of admission. The following factors support the patient status of inpatient.   " The patient's presenting symptoms and signs include generalized weakness, tachypnea and tachycardia " The worrisome physical exam findings include stage 4 PVD ulcer. " The initial radiographic and laboratory data are worrisome because of WBC 12.8 " The chronic co-morbidities include HTN, HLD, PAF   * I certify that at the point of admission it is my clinical judgment that the patient will require inpatient hospital care spanning beyond 2 midnights  from the point of admission due to high intensity of service, high risk for further deterioration and high frequency of surveillance required.*  Time spent in minutes: [place] MS4 [facility]   If 7PM-7AM, please contact night-coverage [email or web address] Password [facility]  04/21/2021, 2:56 PM    ____________________________________________  Patient seen and examined with Fredrik Rigger, Medical student. In addition to supervising the encounter, I played a key role in the decision making process as well as reviewed key findings.  Pt presented with sepsis secondary to her open infected leg wounds and requiring IV fluids, IV antibiotics (broad spectrum) but unfortunately she will ultimately require bilateral AKA as offered by ortho and vascular surgery.  She continues to smoke and again advised against ongoing tobacco use.  She is agreeable to going to [facility] for the procedure.  Dr. Sharol Given says he can perform amputation in 2 days.  Holding apixaban temporarily in anticipation of going to OR on Wednesday.    Murvin Natal, MD Attending physician [facility] How to contact the [facility] Attending or Consulting provider [place] or covering provider during after hours [place], for this patient?  Check the care team in [facility] and look for a) attending/consulting [facility] provider listed and b) the [facility] team listed Log into [email or web address] and use [place]'s universal password to access. If you do not have the password, please contact the hospital operator. Locate the [facility] provider you are looking for under [facility] and page to a number that you can be directly reached. If you still have difficulty reaching the provider, please page the [facility] (Director on Call) for the Hospitalists listed on amion for assistance.

## 2021-04-21 NOTE — ED Notes (Signed)
Attempted IV x 2. Maudie Mercury, Rn attempted IV x 1 with no success.

## 2021-04-21 NOTE — ED Provider Notes (Signed)
Ultrasound ED Peripheral IV (Provider)  Date/Time: 04/21/2021 10:15 AM Performed by: Maudie Flakes, MD Authorized by: Maudie Flakes, MD   Procedure details:    Indications: multiple failed IV attempts     Skin Prep: chlorhexidine gluconate     Location:  Right AC   Angiocath:  20 G   Bedside Ultrasound Guided: Yes     Patient tolerated procedure without complications: Yes     Dressing applied: Yes      Maudie Flakes, MD 04/21/21 1015

## 2021-04-21 NOTE — Progress Notes (Signed)
Messaged Dr. Murvin Natal regarding PICC placement , would like to proceed. Patient to be transferred to [facility].

## 2021-04-21 NOTE — ED Notes (Addendum)
While assessing patient and asking her questions she states "I don't have to tell you shit."

## 2021-04-21 NOTE — ED Notes (Signed)
This nurse placed saline flush on bed next to patient and patient stated "stop throwing shit." This was witnessed to [place], Therapist, sports.

## 2021-04-21 NOTE — ED Notes (Signed)
This nurse asked patient how their day is going and she replies "I'm going to punch you in the face."

## 2021-04-21 NOTE — ED Notes (Signed)
Pt continuing to use obscenities towards staff.

## 2021-04-21 NOTE — Progress Notes (Addendum)
Pharmacy Antibiotic Note  [name] is a 55 y.o. female admitted on 04/21/2021 with sepsis/cellulitis.  Pharmacy has been consulted for Vancomycin  and cefepimedosing.  Plan: Vancomycin 1500mg  IV loading dose, then 1250mg  IV q24h for AUC of 537 Cefepime 2gm IV q12h F/U cxs and clinical progress Monitor V/S, labs and levels as indicated   Temp (24hrs), Avg:98.1 F (36.7 C), Min:97.6 F (36.4 C), Max:98.6 F (37 C)  Recent Labs  Lab 04/21/21 1019  WBC 12.8*  CREATININE 1.33*  LATICACIDVEN 1.2    Estimated Creatinine Clearance: 46.3 mL/min (A) (by C-G formula based on SCr of 1.33 mg/dL (H)).    No Known Allergies  Antimicrobials this admission: Vancomycin 6/27 >>  Cefepime 6/27>>  Microbiology results: 6/27 BCx: pending  MRSA PCR:   Thank you for allowing pharmacy to be a part of this patient's care.  Isac Sarna, BS [place], [place] Clinical Pharmacist Pager [phone] 04/21/2021 12:28 PM

## 2021-04-21 NOTE — Progress Notes (Signed)
Met with pt to obtain PICC consent. She is not receptive to discussion at this time. IV Team will re-evaluate on 6/28 AM. 

## 2021-04-22 ENCOUNTER — Telehealth: Payer: Medicare Other

## 2021-04-22 DIAGNOSIS — E1149 Type 2 diabetes mellitus with other diabetic neurological complication: Secondary | ICD-10-CM

## 2021-04-22 DIAGNOSIS — I480 Paroxysmal atrial fibrillation: Secondary | ICD-10-CM

## 2021-04-22 DIAGNOSIS — R6520 Severe sepsis without septic shock: Secondary | ICD-10-CM

## 2021-04-22 DIAGNOSIS — I70263 Atherosclerosis of native arteries of extremities with gangrene, bilateral legs: Secondary | ICD-10-CM

## 2021-04-22 DIAGNOSIS — I10 Essential (primary) hypertension: Secondary | ICD-10-CM

## 2021-04-22 DIAGNOSIS — A419 Sepsis, unspecified organism: Secondary | ICD-10-CM

## 2021-04-22 DIAGNOSIS — I739 Peripheral vascular disease, unspecified: Secondary | ICD-10-CM

## 2021-04-22 DIAGNOSIS — E876 Hypokalemia: Secondary | ICD-10-CM

## 2021-04-22 DIAGNOSIS — J9611 Chronic respiratory failure with hypoxia: Secondary | ICD-10-CM | POA: Diagnosis not present

## 2021-04-22 DIAGNOSIS — I4891 Unspecified atrial fibrillation: Secondary | ICD-10-CM | POA: Diagnosis not present

## 2021-04-22 DIAGNOSIS — J440 Chronic obstructive pulmonary disease with acute lower respiratory infection: Secondary | ICD-10-CM

## 2021-04-22 DIAGNOSIS — N179 Acute kidney failure, unspecified: Secondary | ICD-10-CM

## 2021-04-22 LAB — COMPREHENSIVE METABOLIC PANEL
ALT: 6 U/L (ref 0–44)
AST: 10 U/L — ABNORMAL LOW (ref 15–41)
Albumin: 1.8 g/dL — ABNORMAL LOW (ref 3.5–5.0)
Alkaline Phosphatase: 66 U/L (ref 38–126)
Anion gap: 10 (ref 5–15)
BUN: 12 mg/dL (ref 6–20)
CO2: 23 mmol/L (ref 22–32)
Calcium: 8.3 mg/dL — ABNORMAL LOW (ref 8.9–10.3)
Chloride: 107 mmol/L (ref 98–111)
Creatinine, Ser: 1.33 mg/dL — ABNORMAL HIGH (ref 0.44–1.00)
GFR, Estimated: 47 mL/min — ABNORMAL LOW (ref >60)
Glucose, Bld: 134 mg/dL — ABNORMAL HIGH (ref 70–99)
Potassium: 3.5 mmol/L (ref 3.5–5.1)
Sodium: 140 mmol/L (ref 135–145)
Total Bilirubin: 0.6 mg/dL (ref 0.3–1.2)
Total Protein: 6.2 g/dL — ABNORMAL LOW (ref 6.5–8.1)

## 2021-04-22 LAB — CBC WITH DIFFERENTIAL/PLATELET
Abs Immature Granulocytes: 0.04 10*3/uL (ref 0.00–0.07)
Basophils Absolute: 0.1 10*3/uL (ref 0.0–0.1)
Basophils Relative: 1 %
Eosinophils Absolute: 0.2 10*3/uL (ref 0.0–0.5)
Eosinophils Relative: 2 %
HCT: 34.7 % — ABNORMAL LOW (ref 36.0–46.0)
Hemoglobin: 10.3 g/dL — ABNORMAL LOW (ref 12.0–15.0)
Immature Granulocytes: 1 %
Lymphocytes Relative: 10 %
Lymphs Abs: 0.9 10*3/uL (ref 0.7–4.0)
MCH: 27.9 pg (ref 26.0–34.0)
MCHC: 29.7 g/dL — ABNORMAL LOW (ref 30.0–36.0)
MCV: 94 fL (ref 80.0–100.0)
Monocytes Absolute: 0.5 10*3/uL (ref 0.1–1.0)
Monocytes Relative: 6 %
Neutro Abs: 6.6 10*3/uL (ref 1.7–7.7)
Neutrophils Relative %: 80 %
Platelets: 345 10*3/uL (ref 150–400)
RBC: 3.69 MIL/uL — ABNORMAL LOW (ref 3.87–5.11)
RDW: 15.9 % — ABNORMAL HIGH (ref 11.5–15.5)
WBC: 8.2 10*3/uL (ref 4.0–10.5)
nRBC: 0 % (ref 0.0–0.2)

## 2021-04-22 LAB — GLUCOSE, CAPILLARY
Glucose-Capillary: 130 mg/dL — ABNORMAL HIGH (ref 70–99)
Glucose-Capillary: 131 mg/dL — ABNORMAL HIGH (ref 70–99)
Glucose-Capillary: 75 mg/dL (ref 70–99)
Glucose-Capillary: 99 mg/dL (ref 70–99)
Glucose-Capillary: 150 mg/dL — ABNORMAL HIGH (ref 70–99)

## 2021-04-22 LAB — MAGNESIUM: Magnesium: 2.3 mg/dL (ref 1.7–2.4)

## 2021-04-22 LAB — PHOSPHORUS: Phosphorus: 3.3 mg/dL (ref 2.5–4.6)

## 2021-04-22 MED ORDER — LIP MEDEX EX OINT
TOPICAL_OINTMENT | CUTANEOUS | Status: DC | PRN
  Filled 2021-04-22: qty 7

## 2021-04-22 MED ORDER — OXYCODONE HCL 5 MG PO TABS
5.0000 mg | ORAL_TABLET | Freq: Four times a day (QID) | ORAL | Status: DC | PRN
  Administered 2021-04-22 – 2021-04-29 (×11): 10 mg via ORAL
  Administered 2021-04-29 – 2021-04-30 (×2): 5 mg via ORAL
  Administered 2021-04-30 – 2021-05-06 (×7): 10 mg via ORAL
  Administered 2021-05-06: 5 mg via ORAL
  Administered 2021-05-07 – 2021-05-14 (×12): 10 mg via ORAL
  Administered 2021-05-15 – 2021-05-16 (×3): 5 mg via ORAL
  Administered 2021-05-16 – 2021-05-17 (×2): 10 mg via ORAL
  Filled 2021-04-22 (×6): qty 2
  Filled 2021-04-22: qty 1
  Filled 2021-04-22 (×19): qty 2
  Filled 2021-04-22: qty 1
  Filled 2021-04-22 (×4): qty 2
  Filled 2021-04-22 (×3): qty 1
  Filled 2021-04-22 (×2): qty 2
  Filled 2021-04-22: qty 1
  Filled 2021-04-22: qty 2
  Filled 2021-04-22: qty 1
  Filled 2021-04-22: qty 2

## 2021-04-22 MED ORDER — POTASSIUM CHLORIDE CRYS ER 20 MEQ PO TBCR
40.0000 meq | EXTENDED_RELEASE_TABLET | Freq: Once | ORAL | Status: AC
  Administered 2021-04-22: 40 meq via ORAL
  Filled 2021-04-22: qty 2

## 2021-04-22 MED ORDER — MORPHINE SULFATE (PF) 2 MG/ML IV SOLN
2.0000 mg | INTRAVENOUS | Status: DC | PRN
  Administered 2021-04-22 – 2021-05-17 (×10): 2 mg via INTRAVENOUS
  Filled 2021-04-22 (×11): qty 1

## 2021-04-22 MED ORDER — HEPARIN SODIUM (PORCINE) 5000 UNIT/ML IJ SOLN
5000.0000 [IU] | Freq: Three times a day (TID) | INTRAMUSCULAR | Status: DC
Start: 2021-04-22 — End: 2021-04-25
  Administered 2021-04-22 – 2021-04-25 (×6): 5000 [IU] via SUBCUTANEOUS
  Filled 2021-04-22 (×7): qty 1

## 2021-04-22 NOTE — Consult Note (Signed)
ASSESSMENT & PLAN   CRITICAL LIMB ISCHEMIA: This patient has quite extensive wounds of both legs and the left leg especially may not be salvageable.  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  Given the extent of the wounds and that her only remaining option would be bilateral amputations I think it would be worth proceeding with arteriography to further assess her circulation and see if there is anything we could do that might potentially facilitate healing.  Her Eliquis is being held.  I discussed the indications for arteriography and the potential complications including but not limited to bleeding, arterial injury, or worsening renal insufficiency.  We would do her arteriogram with limited contrast and possibly some CO2.  I explained that if we find disease amenable to angioplasty this could potentially be done at the same time.  I also explained that this will be done by one of my partners likely Dr. Stanford Breed.  ID: She is on Maxipime and Vancomycin IV.   I have written preop orders.    REASON FOR CONSULT:    Bilateral lower extremity ischemia.  The consult is requested by Dr. Meridee Score.   HPI:   [name] is a 55 y.o. female who was admitted yesterday with bilateral leg pain.  She has extensive wounds on both legs and was seen by Dr. Sharol Given.  She has evidence of peripheral vascular disease and for this reason vascular surgery was consulted.  On my history, she states that she has had wounds on both legs for about 2 years.  She does minimal walking with a walker and states that for the most part she is in a wheelchair.  She does describe rest pain in both lower extremities at night.  Her risk factors for peripheral vascular disease include diabetes, hypertension, hypercholesterolemia, a family history of premature cardiovascular disease, and tobacco use.  She has a history of atrial fibrillation and  her home medications list Eliquis.  However, her Eliquis is being held in anticipation of possible surgery.  She is on a statin.  She does not appear to be on aspirin.  Past Medical History:  Diagnosis Date   Alcohol use    Ankle fracture, lateral malleolus, closed 2013   Anxiety    Aortic atherosclerosis ([place]) 03/10/2021   Breast mass, left 2013   CHF (congestive heart failure) ([place])    a. EF 20-25% by echo in 05/2016 with cath showing normal cors b. EF 50-55% in 07/2020 c. 01/2021: EF at 55-60% with moderate LVH   Chronic anemia    CKD (chronic kidney disease), stage III (HCC)    Cocaine abuse (HCC)    COPD (chronic obstructive pulmonary disease) ([place])    Diabetes mellitus, type 2 (HCC)    Diabetic Charcot foot ([place])    Essential hypertension    History of cardiomyopathy    History of GI bleed 2011   Hyperlipidemia    Noncompliance    Obesity    PAF (paroxysmal atrial fibrillation) (HCC)    Panic attacks    PAT (paroxysmal atrial tachycardia) (HCC)    Previously on Amiodarone   Seizures ([place]) 10/01/2020   Sleep apnea    Not on CPAP   Stroke ([place]) 2018   Tobacco abuse    Urinary incontinence     Family History  Problem Relation Age of Onset   Hypertension Mother    Heart attack Mother  Hypertension Father        CABG    Hypertension Sister    Hypertension Brother    Hypertension Sister    Cancer Sister        breast    Arthritis Other    Cancer Other    Diabetes Other    Asthma Other    Hypertension Daughter    Hypertension Son     SOCIAL HISTORY: Social History   Tobacco Use   Smoking status: Some Days    Packs/day: 0.25    Years: 20.00    Pack years: 5.00    Types: Cigarettes    Last attempt to quit: 06/03/2017    Years since quitting: 3.8   Smokeless tobacco: Never  Substance Use Topics   Alcohol use: Not Currently    Alcohol/week: 0.0 standard drinks    Comment: Quit in 2017    No Known Allergies  Current Facility-Administered Medications   Medication Dose Route Frequency Provider Last Rate Last Admin   acetaminophen (TYLENOL) tablet 650 mg  650 mg Oral Q6H PRN Wynetta Emery, Clanford L, MD   650 mg at 04/21/21 1911   Or   acetaminophen (TYLENOL) suppository 650 mg  650 mg Rectal Q6H PRN Johnson, Clanford L, MD       amLODipine (NORVASC) tablet 10 mg  10 mg Oral Daily Johnson, Clanford L, MD   10 mg at 04/21/21 1443   ceFEPIme (MAXIPIME) 2 g in sodium chloride 0.9 % 100 mL IVPB  2 g Intravenous Q12H Johnson, Clanford L, MD 200 mL/hr at 04/21/21 2119 2 g at 04/21/21 2119   DULoxetine (CYMBALTA) DR capsule 60 mg  60 mg Oral Daily Johnson, Clanford L, MD   60 mg at 04/21/21 1323   heparin injection 5,000 Units  5,000 Units Subcutaneous Q8H Johnson, Clanford L, MD   5,000 Units at 04/22/21 0624   insulin aspart (novoLOG) injection 0-5 Units  0-5 Units Subcutaneous QHS Johnson, Clanford L, MD       insulin aspart (novoLOG) injection 0-9 Units  0-9 Units Subcutaneous TID WC Johnson, Clanford L, MD   1 Units at 04/22/21 0651   insulin aspart (novoLOG) injection 3 Units  3 Units Subcutaneous TID WC Johnson, Clanford L, MD   3 Units at 04/22/21 0759   levETIRAcetam (KEPPRA) tablet 750 mg  750 mg Oral BID Johnson, Clanford L, MD   750 mg at 04/21/21 1539   LORazepam (ATIVAN) tablet 0.5 mg  0.5 mg Oral TID PRN Wynetta Emery, Clanford L, MD       metoprolol tartrate (LOPRESSOR) tablet 50 mg  50 mg Oral BID Wynetta Emery, Clanford L, MD   50 mg at 04/21/21 2118   morphine 2 MG/ML injection 2 mg  2 mg Intravenous Q4H PRN Cristal Ford, DO   2 mg at 04/22/21 0800   ondansetron (ZOFRAN) tablet 4 mg  4 mg Oral Q6H PRN Johnson, Clanford L, MD       Or   ondansetron (ZOFRAN) injection 4 mg  4 mg Intravenous Q6H PRN Johnson, Clanford L, MD       oxybutynin (DITROPAN-XL) 24 hr tablet 5 mg  5 mg Oral QHS Johnson, Clanford L, MD   5 mg at 04/21/21 2118   oxyCODONE (Oxy IR/ROXICODONE) immediate release tablet 5-10 mg  5-10 mg Oral Q6H PRN Cristal Ford, DO        pantoprazole (PROTONIX) EC tablet 40 mg  40 mg Oral Daily Johnson, Clanford L, MD   40 mg  at 04/21/21 1321   rosuvastatin (CRESTOR) tablet 10 mg  10 mg Oral Daily Johnson, Clanford L, MD   10 mg at 04/21/21 1321   traZODone (DESYREL) tablet 50 mg  50 mg Oral QHS Johnson, Clanford L, MD   50 mg at 04/21/21 2118   umeclidinium bromide (INCRUSE ELLIPTA) 62.5 MCG/INH 1 puff  1 puff Inhalation Daily Johnson, Clanford L, MD       vancomycin (VANCOREADY) IVPB 1250 mg/250 mL  1,250 mg Intravenous Q24H Johnson, Clanford L, MD       zinc sulfate capsule 220 mg  220 mg Oral Daily Johnson, Clanford L, MD   220 mg at 04/21/21 1320    REVIEW OF SYSTEMS:  [X]  denotes positive finding, [ ]  denotes negative finding Cardiac  Comments:  Chest pain or chest pressure:    Shortness of breath upon exertion:    Short of breath when lying flat:    Irregular heart rhythm:        Vascular    Pain in calf, thigh, or hip brought on by ambulation:    Pain in feet at night that wakes you up from your sleep:     Blood clot in your veins:    Leg swelling:         Pulmonary    Oxygen at home:    Productive cough:     Wheezing:         Neurologic    Sudden weakness in arms or legs:     Sudden numbness in arms or legs:     Sudden onset of difficulty speaking or slurred speech:    Temporary loss of vision in one eye:     Problems with dizziness:         Gastrointestinal    Blood in stool:     Vomited blood:         Genitourinary    Burning when urinating:     Blood in urine:        Psychiatric    Major depression:         Hematologic    Bleeding problems:    Problems with blood clotting too easily:        Skin    Rashes or ulcers:        Constitutional    Fever or chills:    -  PHYSICAL EXAM:   Vitals:   04/21/21 1900 04/22/21 0017 04/22/21 0314 04/22/21 0700  BP: (!) 125/95 109/82 98/71 111/76  Pulse: 86     Resp: (!) 25 (!) 22 20 13   Temp: (!) 97.2 F (36.2 C)  97.8 F (36.6 C)    TempSrc: Oral  Oral   SpO2: 100%  96% 92%  Weight: 77 kg  76.9 kg   Height: 5\' 1"  (1.549 m)      Body mass index is 32.03 kg/m.  GENERAL: The patient is a well-nourished female, in no acute distress. The vital signs are documented above. CARDIAC: There is a regular rate and rhythm.  VASCULAR: I do not detect carotid bruits. On the right side she has a femoral pulse with a diminished popliteal pulse.  She has a brisk but monophasic posterior tibial signal with a Doppler and a dampened monophasic dorsalis pedis signal with the Doppler. On the left side, she has a palpable femoral and popliteal pulse.  She has a brisk but monophasic dorsalis pedis and posterior tibial signal. She has no significant lower extremity swelling. PULMONARY: There  is good air exchange bilaterally without wheezing or rales. ABDOMEN: Soft and non-tender with normal pitched bowel sounds.  MUSCULOSKELETAL: There are no major deformities. NEUROLOGIC: No focal weakness or paresthesias are detected. SKIN: She has full-thickness wounds to both legs as documented in the photographs below.  The wounds on the left leg are more extensive.       PSYCHIATRIC: The patient has a normal affect.  DATA:    LABS: Reviewed the labs from today.  GFR is 47.  Creatinine 1.33.  Her albumin is 1.8.  ABI's: These have been ordered but are pending.  ECHO: I reviewed her echo from 02/05/2021.  This showed the left ventricular ejection fraction was estimated at 55 to 60%.  There was moderate left ventricular hypertrophy.  Deitra [facility]

## 2021-04-22 NOTE — Consult Note (Signed)
   [facility] Inpatient Consult   04/22/2021  [name] 1966-08-16 [id number]  [facility] [[facility]] Patient [employer]   Patient screened for extreme high risk score for unplanned readmission risk.a  Review of patient's medical record reveals patient is a long term resident at [facility] noted.   Plan:  Sign off, post hospital needs to be met at the SNF.    For questions contact:   Natividad Brood, RN BSN [facility] Liaison  [phone] business mobile phone Toll free office [phone]  Fax number: [phone] [email or web address]

## 2021-04-22 NOTE — Plan of Care (Signed)

## 2021-04-22 NOTE — Progress Notes (Signed)
At bedside to place picc line and patients states she don't want to hear nothing about no picc right now she just wants to be comfortable . Helped pull patient up in bed . Will check back later today or tomorrow for picc placement.

## 2021-04-22 NOTE — Consult Note (Signed)
ORTHOPAEDIC CONSULTATION  REQUESTING PHYSICIAN: Cristal Ford, DO  Chief Complaint: Ischemic pain both legs.  HPI: [name] is a 55 y.o. female who presents with ischemic ulcerations bilateral lower extremities.  Patient has diabetes with peripheral arterial and venous insufficiency.  Patient has had increased pain odor and drainage from both legs.  Past Medical History:  Diagnosis Date   Alcohol use    Ankle fracture, lateral malleolus, closed 2013   Anxiety    Aortic atherosclerosis ([place]) 03/10/2021   Breast mass, left 2013   CHF (congestive heart failure) ([place])    a. EF 20-25% by echo in 05/2016 with cath showing normal cors b. EF 50-55% in 07/2020 c. 01/2021: EF at 55-60% with moderate LVH   Chronic anemia    CKD (chronic kidney disease), stage III (HCC)    Cocaine abuse ([place])    COPD (chronic obstructive pulmonary disease) ([place])    Diabetes mellitus, type 2 ([place])    Diabetic Charcot foot ([place])    Essential hypertension    History of cardiomyopathy    History of GI bleed 2011   Hyperlipidemia    Noncompliance    Obesity    PAF (paroxysmal atrial fibrillation) (HCC)    Panic attacks    PAT (paroxysmal atrial tachycardia) ([place])    Previously on Amiodarone   Seizures ([place]) 10/01/2020   Sleep apnea    Not on CPAP   Stroke ([place]) 2018   Tobacco abuse    Urinary incontinence    Past Surgical History:  Procedure Laterality Date   BIOPSY  11/12/2020   Procedure: BIOPSY;  Surgeon: Harvel Quale, MD;  [facility];  Service: Gastroenterology;;   BREAST BIOPSY     CARDIAC CATHETERIZATION N/A 07/28/2016   Procedure: Left Heart Cath and Coronary Angiography;  Surgeon: Jettie Booze, MD;  Location: [facility];  Service: Cardiovascular;  Laterality: N/A;   COLONOSCOPY N/A 05/10/2019   Procedure: COLONOSCOPY;  Surgeon: Danie Binder, MD;  [facility];  Service: Endoscopy;  Laterality: N/A;  Phenergan 12.5 mg IV in  pre-op   DILATION AND CURETTAGE OF UTERUS     ESOPHAGOGASTRODUODENOSCOPY (EGD) WITH PROPOFOL N/A 11/12/2020   Procedure: ESOPHAGOGASTRODUODENOSCOPY (EGD) WITH PROPOFOL;  Surgeon: Harvel Quale, MD;  [facility];  Service: Gastroenterology;  Laterality: N/A;   I & D EXTREMITY Bilateral 09/22/2017   Procedure: BILATERAL DEBRIDEMENT LEG/FOOT ULCERS, APPLY VERAFLO WOUND VAC;  Surgeon: Newt Minion, MD;  Location: [facility];  Service: Orthopedics;  Laterality: Bilateral;   I & D EXTREMITY Right 10/11/2018   Procedure: IRRIGATION AND DEBRIDEMENT RIGHT HAND;  Surgeon: [name], MD;  Location: [facility];  Service: Orthopedics;  Laterality: Right;   I & D EXTREMITY Right 10/13/2018   Procedure: REPEAT IRRIGATION AND DEBRIDEMENT RIGHT HAND;  Surgeon: [name], MD;  Location: [facility];  Service: Orthopedics;  Laterality: Right;   I & D EXTREMITY Right 11/22/2018   Procedure: IRRIGATION AND DEBRIDEMENT AND PINNING RIGHT HAND;  Surgeon: [name], MD;  Location: [facility];  Service: Orthopedics;  Laterality: Right;   IR RADIOLOGIST EVAL & MGMT  07/05/2018   POLYPECTOMY  05/10/2019   Procedure: POLYPECTOMY;  Surgeon: Danie Binder, MD;  [facility];  Service: Endoscopy;;   SKIN SPLIT GRAFT Bilateral 09/28/2017   Procedure: BILATERAL SPLIT THICKNESS SKIN GRAFT LEGS/FEET AND APPLY VAC;  Surgeon: Newt Minion, MD;  Location: [facility];  Service: Orthopedics;  Laterality: Bilateral;   SKIN SPLIT GRAFT Right 11/22/2018   Procedure: SKIN GRAFT SPLIT THICKNESS;  Surgeon: [name], MD;  Location: [facility];  Service: Orthopedics;  Laterality: Right;   Social History   Socioeconomic History   Marital status: Widowed    Spouse name: Not on file   Number of children: 2   Years of education: 9   Highest education level: 9th grade  Occupational History   Occupation: Disabled  Tobacco Use   Smoking status: Some Days    Packs/day: 0.25    Years: 20.00    Pack years: 5.00     Types: Cigarettes    Last attempt to quit: 06/03/2017    Years since quitting: 3.8   Smokeless tobacco: Never  Vaping Use   Vaping Use: Never used  Substance and Sexual Activity   Alcohol use: Not Currently    Alcohol/week: 0.0 standard drinks    Comment: Quit in 2017   Drug use: Not Currently    Frequency: 3.0 times per week    Types: Marijuana, Cocaine    Comment: none since August 2018   Sexual activity: Not on file  Other Topics Concern   Not on file  Social History Narrative   Right handed   Drinks 1-2 cups caffeine daily   Social Determinants of Health   Financial Resource Strain: Medium Risk   Difficulty of Paying Living Expenses: Somewhat hard  Food Insecurity: Food Insecurity Present   Worried About Charity fundraiser in the Last Year: Sometimes true   Ran Out of Food in the Last Year: Sometimes true  Transportation Needs: No Transportation Needs   Lack of Transportation (Medical): No   Lack of Transportation (Non-Medical): No  Physical Activity: Insufficiently Active   Days of Exercise per Week: 7 days   Minutes of Exercise per Session: 20 min  Stress: Stress Concern Present   Feeling of Stress : To some extent  Social Connections: Moderately Isolated   Frequency of Communication with Friends and Family: More than three times a week   Frequency of Social Gatherings with Friends and Family: More than three times a week   Attends Religious Services: More than 4 times per year   Active Member of [employer] or Organizations: No   Attends Archivist Meetings: Never   Marital Status: Widowed   Family History  Problem Relation Age of Onset   Hypertension Mother    Heart attack Mother    Hypertension Father        CABG    Hypertension Sister    Hypertension Brother    Hypertension Sister    Cancer Sister        breast    Arthritis Other    Cancer Other    Diabetes Other    Asthma Other    Hypertension Daughter    Hypertension Son    - negative  except otherwise stated in the family history section No Known Allergies Prior to Admission medications   Medication Sig Start Date End Date Taking? Authorizing Provider  acetaminophen (TYLENOL) 325 MG tablet Take 2 tablets (650 mg total) by mouth every 6 (six) hours as needed for headache, fever or mild pain. 11/04/20  Yes Johnson, Clanford L, MD  Alogliptin Benzoate 25 MG TABS Take 1 tablet by mouth daily. 04/20/21  Yes [provider]  amLODipine (NORVASC) 10 MG tablet Take 1 tablet (10 mg total) by mouth daily. 12/07/20  Yes Roxan Hockey, MD  apixaban Arne [place])  5 MG TABS tablet Take 1 tablet (5 mg total) by mouth 2 (two) times daily. Okay to restart Eliquis on 11/17/2020 03/27/21  Yes Shahmehdi, Seyed A, MD  DULoxetine (CYMBALTA) 60 MG capsule Take 1 capsule (60 mg total) by mouth daily. 12/07/20  Yes Roxan Hockey, MD  furosemide (LASIX) 40 MG tablet Take 0.5 tablets (20 mg total) by mouth every Tuesday, Thursday, Saturday, and Sunday. 03/27/21  Yes Shahmehdi, Valeria Batman, MD  HUMALOG KWIKPEN 100 UNIT/ML KwikPen Inject 1-10 Units into the skin See admin instructions. 151-200 =1u, 201-250= 2u, 251-300= 4u, 301-350= 6u, 351-400= 8u, 401-450= 10u 03/28/21  Yes [provider]  levETIRAcetam (KEPPRA) 750 MG tablet Take 1 tablet (750 mg total) by mouth 2 (two) times daily. 01/30/21  Yes Suzzanne Cloud, NP  LORazepam (ATIVAN) 0.5 MG tablet Take 1 tablet (0.5 mg total) by mouth 3 (three) times daily as needed for anxiety. 04/04/21  Yes Johnson, Clanford L, MD  metoprolol tartrate (LOPRESSOR) 50 MG tablet Take 1 tablet (50 mg total) by mouth 2 (two) times daily. 04/04/21  Yes Johnson, Clanford L, MD  omeprazole (PRILOSEC) 20 MG capsule Take 20 mg by mouth daily.   Yes [provider]  oxybutynin (DITROPAN-XL) 5 MG 24 hr tablet Take 5 mg by mouth at bedtime.   Yes [provider]  oxyCODONE (OXY IR/ROXICODONE) 5 MG immediate release tablet Take 5 mg by mouth every 8 (eight) hours  as needed for severe pain or moderate pain.   Yes [provider]  rosuvastatin (CRESTOR) 10 MG tablet Take 10 mg by mouth daily.   Yes [provider]  silver sulfADIAZINE (SILVADENE) 1 % cream Apply 1 application topically daily.   Yes [provider]  tiotropium (SPIRIVA HANDIHALER) 18 MCG inhalation capsule Place 18 mcg into inhaler and inhale daily as needed (Shortness of breath).   Yes [provider]  traZODone (DESYREL) 50 MG tablet Take 1 tablet (50 mg total) by mouth at bedtime. 12/07/20  Yes Emokpae, Courage, MD  TRULICITY 3 ZJ/6.7HA SOPN Inject 3 mg into the skin See admin instructions. Inject 0.5 mls subcutaneously one time a day every Friday 03/11/21  Yes [provider]  Zinc Oxide 10 % AERO Apply 1 application topically daily.   Yes [provider]  zinc sulfate 220 (50 Zn) MG capsule Take 1 capsule (220 mg total) by mouth daily. 12/08/20  Yes Emokpae, Courage, MD  albuterol (PROAIR HFA) 108 (90 Base) MCG/ACT inhaler INHALE 2 PUFFS EVERY 6 HOURS AS NEEDED FOR SHORTNESS OF BREATH/WHEEZING. Patient not taking: Reported on 04/21/2021 08/05/20 04/21/21  [name], MD   DG Chest Port 1 View  Result Date: 04/21/2021 CLINICAL DATA:  Tachycardia and tachypnea EXAM: PORTABLE CHEST 1 VIEW COMPARISON:  Mar 09, 2021 FINDINGS: No edema or airspace opacity. Cardiomegaly again noted with pulmonary vascularity within normal limits. No adenopathy. No bone lesions. IMPRESSION: Cardiomegaly again noted.  No edema or airspace opacity. Electronically Signed   By: Lowella Grip III M.D.   On: 04/21/2021 09:50   [name] EKG SITE RITE  Result Date: 04/21/2021 If Site Rite image not attached, placement could not be confirmed due to current cardiac rhythm.  - pertinent xrays, CT, MRI studies were reviewed and independently interpreted  Positive ROS: All other systems have been reviewed and were otherwise negative with the exception of those  mentioned in the HPI and as above.  Physical Exam: General: Alert, no acute distress Psychiatric: Patient is competent for  consent with normal mood and affect Lymphatic: No axillary or cervical lymphadenopathy Cardiovascular: No pedal edema Respiratory: No cyanosis, no use of accessory musculature GI: No organomegaly, abdomen is soft and non-tender    Images:  @ENCIMAGES @  Labs:  Lab Results  Component Value Date   HGBA1C 11.1 (H) 03/23/2021   HGBA1C 11.1 (H) 12/03/2020   HGBA1C 13.0 (H) 10/29/2020   CRP 3.6 (H) 12/07/2020   CRP 10.1 (H) 12/06/2020   CRP 19.5 (H) 12/05/2020   REPTSTATUS PENDING 04/21/2021   REPTSTATUS PENDING 04/21/2021   GRAMSTAIN  08/15/2020    WBC PRESENT,BOTH PMN AND MONONUCLEAR RED BLOOD CELLS PRESENT NO ORGANISMS SEEN CYTOSPIN SMEAR    GRAMSTAIN  08/15/2020    NO ORGANISMS SEEN Gram Stain Report Called to,Read Back By and Verified With: [facility] RN AT [place] ON 08/15/20 BY S.VANHOORNE Performed at [facility], [place] [address], [place], [place] [address]    CULT  04/21/2021    NO GROWTH < 24 HOURS Performed at [facility], [address] [place], [place] [address]    CULT  04/21/2021    NO GROWTH < 24 HOURS Performed at [facility], [address], [place], [place] [address]    LABORGA STAPHYLOCOCCUS AUREUS (A) 10/29/2020    Lab Results  Component Value Date   ALBUMIN 1.8 (L) 04/22/2021   ALBUMIN 2.3 (L) 04/21/2021   ALBUMIN 1.7 (L) 04/03/2021     CBC EXTENDED Latest Ref Rng & Units 04/22/2021 04/21/2021 04/03/2021  WBC 4.0 - 10.5 K/uL 8.2 12.8(H) 12.8(H)  RBC 3.87 - 5.11 MIL/uL 3.69(L) 3.86(L) 3.19(L)  HGB 12.0 - 15.0 g/dL 10.3(L) 10.8(L) 9.2(L)  HCT 36.0 - 46.0 % 34.7(L) 36.9 30.6(L)  PLT 150 - 400 K/uL 345 359 529(H)  NEUTROABS 1.7 - 7.7 K/uL 6.6 10.1(H) 10.2(H)  LYMPHSABS 0.7 - 4.0 K/uL 0.9 1.3 1.4    Neurologic: Patient does not have protective sensation bilateral lower extremities.   MUSCULOSKELETAL:    Skin: Examination patient has black eschar over the gaiter area of both legs her legs are thin and atrophic I cannot palpate a pulse.  Patient complains of excruciating pain.  There is no ascending cellulitis.  Patient has a white cell count that is dropped from 12.8 currently 8.2 hemoglobin stable at 10.3.  Albumin 1.8.  Assessment: Assessment: Ischemic ulcers bilateral legs with pain.  Plan: Ankle-brachial indices are ordered I will consult vascular surgery to see if there is any vascular intervention available.  Discussed with patient she may require bilateral amputations.  Thank you for the consult and the opportunity to see [name], [place] [phone] 9:16 AM

## 2021-04-22 NOTE — Progress Notes (Signed)
PROGRESS NOTE    [name]  CZY:[id number] DOB: [birth year] DOA: 04/21/2021 PCP: Caprice Renshaw, MD   Brief Narrative:  HPI On 04/18/2021 by Dr. Irwin Brakeman [name] is a 55 y.o. female with history of tobacco use disorder, severe PVD, paroxysmal atrial fibrillation currently on anticoagulant, hyperlipidemia, hypertension, uncontrolled T2DM, CHF, CKD, and more presents to the ED with a chief complaint of bilateral leg pain. Has no other complaints, no change in bowel habits. No change in urinary habits. Denies dyspnea and cough. Wears 2L oxygen at baseline.   Son reported that the nursing facility at [facility] explained that patient has had decreased PO and had not been ambulating, though this was her baseline. Son wanted patient here to be assessed and following up with orthopedic and vascular recommendations to have her legs amputated.   Chart review showed that patient was recently admitted to the hospital on 6/07 and discharged 6/10 due to sepsis 2/2 secondary to infected pressure ulcers on the bilateral lower extremities. She refused IV antibiotics after day 3 and was discharged home on doxycycline. To this, she states that she wanted to hasten discharge process due to "wanting to smoke a cigarette." Subsequently, she followed up with orthopedic surgery (Dr. Sharol Given) and vascular surgery (Dr. Oneida Alar) who both recommended bilateral amputation as she is not candidate for revascularization.  Interim history Admitted with sepsis secondary to lower extremity ulceration/cellulitis.  Patient noted to have dry gangrene.  Currently on IV antibiotics.  Dr. Sharol Given, orthopedics consulted and appreciated.  Patient may need amputation. Assessment & [name]   Sepsis secondary to bilateral lower extremity leg ulcerations with cellulitis/dry gangrene secondary to severe PVD -Present on admission, patient noted to be tachycardic with tachypnea and leukocytosis of 12.8 -Patient placed on vancomycin and  cefepime- -Orthopedics, Dr. Sharol Given consulted and appreciated -Vascular surgery consulted and appreciated -Lower extremity ABIs ordered -Continue pain control, have placed patient on morphine as well as increased her oxycodone  Diabetes mellitus, type II -Hemoglobin A1c 11.1 on 03/23/2021 -Continue insulin sliding scale and CBG monitoring  Chronic hypoxic respiratory failure -Patient on 2 L of oxygen at baseline -Continue to monitor  Paroxysmal atrial fibrillation -Eliquis currently held -Continue metoprolol for rate control  Essential hypertension -Continue metoprolol, amlodipine  Hyperlipidemia -Continue statin  Hypokalemia/hypomagnesemia -Replace and continue to monitor  Chronic kidney disease, stage IIIb-IV -Upon review of patient's GFR, she has ranged in the 20s to low 30s since January 2022 -Continue to monitor closely  DVT Prophylaxis Heparin  Code Status: Full  Family Communication: None at bedside  Disposition [name]:  Status is: Inpatient  Remains inpatient appropriate because:IV treatments appropriate due to intensity of illness or inability to take PO and Inpatient level of care appropriate due to severity of illness  Dispo: The patient is from: Home              Anticipated d/c is to:  TBD              Patient currently is not medically stable to d/c.   Difficult to place patient No    Consultants Orthopedic surgery Vascular surgery  Procedures  None  Antibiotics   Anti-infectives (From admission, onward)    Start     Dose/Rate Route Frequency Ordered Stop   04/22/21 1200  vancomycin (VANCOREADY) IVPB 1250 mg/250 mL        1,250 mg 166.7 mL/hr over 90 Minutes Intravenous Every 24 hours 04/21/21 1232     04/21/21 2200  ceFEPIme (  MAXIPIME) 2 g in sodium chloride 0.9 % 100 mL IVPB        2 g 200 mL/hr over 30 Minutes Intravenous Every 12 hours 04/21/21 1249     04/21/21 1000  vancomycin (VANCOREADY) IVPB 1500 mg/300 mL        1,500 mg 150 mL/hr  over 120 Minutes Intravenous  Once 04/21/21 0948 04/21/21 1443   04/21/21 0945  ceFEPIme (MAXIPIME) 2 g in sodium chloride 0.9 % 100 mL IVPB        2 g 200 mL/hr over 30 Minutes Intravenous  Once 04/21/21 0936 04/21/21 1114       Subjective:   [name] seen and examined today.  Patient yelling out in pain.  Will not answer my questions.  Proceeded to curse at me and "you ain't worth a (Explicit)."   Objective:   Vitals:   04/22/21 0314 04/22/21 0700 04/22/21 1000 04/22/21 1218  BP: 98/71 111/76 112/81 116/77  Pulse:    88  Resp: 20 13  18   Temp: 97.8 F (36.6 C)   97.8 F (36.6 C)  TempSrc: Oral   Oral  SpO2: 96% 92%  92%  Weight: 76.9 kg     Height:        Intake/Output Summary (Last 24 hours) at 04/22/2021 1424 Last data filed at 04/22/2021 0318 Gross per 24 hour  Intake 3249.65 ml  Output --  Net 3249.65 ml   Filed Weights   04/21/21 1900 04/22/21 0314  Weight: 77 kg 76.9 kg    Exam (exam limited by patient) General: Well developed, chronically ill-appearing, distressed and yelling out due to pain HEENT: NCAT, mucous membranes moist.  Extremities: warm dry without cyanosis clubbing.  Lower extremity edema bilaterally.  Dry eschar noted, dry gangrene Neuro: AAOx3, cranial nerves grossly intact. Strength 5/5 in patient's upper and lower extremities bilaterally Skin: Without rashes exudates or nodules, wounds on lower extremities bilaterally Psych: cantankerous, anxious   Data Reviewed: I have personally reviewed following labs and imaging studies  CBC: Recent Labs  Lab 04/21/21 1019 04/22/21 0257  WBC 12.8* 8.2  NEUTROABS 10.1* 6.6  HGB 10.8* 10.3*  HCT 36.9 34.7*  MCV 95.6 94.0  PLT 359 409   Basic Metabolic Panel: Recent Labs  Lab 04/21/21 1019 04/22/21 0257  NA 140 140  K 2.6* 3.5  CL 99 107  CO2 27 23  GLUCOSE 165* 134*  BUN 13 12  CREATININE 1.33* 1.33*  CALCIUM 8.7* 8.3*  MG 1.5*  TEST WILL BE CREDITED 2.3  PHOS  --  3.3    GFR: Estimated Creatinine Clearance: 44.3 mL/min (A) (by C-G formula based on SCr of 1.33 mg/dL (H)). Liver Function Tests: Recent Labs  Lab 04/21/21 1019 04/22/21 0257  AST 12* 10*  ALT 9 6  ALKPHOS 82 66  BILITOT 1.0 0.6  PROT 7.1 6.2*  ALBUMIN 2.3* 1.8*   No results for input(s): LIPASE, AMYLASE in the last 168 hours. No results for input(s): AMMONIA in the last 168 hours. Coagulation Profile: Recent Labs  Lab 04/21/21 1019  INR 1.7*   Cardiac Enzymes: No results for input(s): CKTOTAL, CKMB, CKMBINDEX, TROPONINI in the last 168 hours. BNP (last 3 results) No results for input(s): PROBNP in the last 8760 hours. HbA1C: No results for input(s): HGBA1C in the last 72 hours. CBG: Recent Labs  Lab 04/21/21 1908 04/21/21 2231 04/22/21 0315 04/22/21 0613 04/22/21 1116  GLUCAP 148* 148* 130* 131* 75   Lipid Profile: No results  for input(s): CHOL, HDL, LDLCALC, TRIG, CHOLHDL, LDLDIRECT in the last 72 hours. Thyroid Function Tests: No results for input(s): TSH, T4TOTAL, FREET4, T3FREE, THYROIDAB in the last 72 hours. Anemia Panel: No results for input(s): VITAMINB12, FOLATE, FERRITIN, TIBC, IRON, RETICCTPCT in the last 72 hours. Urine analysis:    Component Value Date/Time   COLORURINE STRAW (A) 04/01/2021 1732   APPEARANCEUR CLEAR 04/01/2021 1732   LABSPEC 1.008 04/01/2021 1732   PHURINE 6.0 04/01/2021 1732   GLUCOSEU 50 (A) 04/01/2021 1732   HGBUR NEGATIVE 04/01/2021 1732   BILIRUBINUR NEGATIVE 04/01/2021 1732   KETONESUR NEGATIVE 04/01/2021 1732   PROTEINUR 100 (A) 04/01/2021 1732   UROBILINOGEN 0.2 09/11/2014 1319   NITRITE NEGATIVE 04/01/2021 1732   LEUKOCYTESUR SMALL (A) 04/01/2021 1732   Sepsis Labs: @LABRCNTIP (procalcitonin:4,lacticidven:4)  ) Recent Results (from the past 240 hour(s))  Blood culture (routine x 2)     Status: None (Preliminary result)   Collection Time: 04/21/21 10:19 AM   Specimen: BLOOD  Result Value Ref Range Status    Specimen Description BLOOD RIGHT ANTECUBITAL  Final   Special Requests   Final    BOTTLES DRAWN AEROBIC AND ANAEROBIC Blood Culture adequate volume   Culture   Final    NO GROWTH < 24 HOURS Performed at [facility], [address], [place], [place] [address]    Report Status PENDING  Incomplete  Blood culture (routine x 2)     Status: None (Preliminary result)   Collection Time: 04/21/21 10:19 AM   Specimen: BLOOD  Result Value Ref Range Status   Specimen Description BLOOD BLOOD RIGHT ARM  Final   Special Requests   Final    BOTTLES DRAWN AEROBIC AND ANAEROBIC Blood Culture adequate volume   Culture   Final    NO GROWTH < 24 HOURS Performed at [facility], [address], [place], [place] [address]    Report Status PENDING  Incomplete  Resp Panel by RT-PCR (Flu A&B, Covid) Nasopharyngeal Swab     Status: None   Collection Time: 04/21/21 10:41 AM   Specimen: Nasopharyngeal Swab; Nasopharyngeal(NP) swabs in vial transport medium  Result Value Ref Range Status   SARS Coronavirus 2 by RT PCR NEGATIVE NEGATIVE Final    Comment: (NOTE) SARS-CoV-2 target nucleic acids are NOT DETECTED.  The SARS-CoV-2 RNA is generally detectable in upper respiratory specimens during the acute phase of infection. The lowest concentration of SARS-CoV-2 viral copies this assay can detect is 138 copies/mL. A negative result does not preclude SARS-Cov-2 infection and should not be used as the sole basis for treatment or other patient management decisions. A negative result may occur with  improper specimen collection/handling, submission of specimen other than nasopharyngeal swab, presence of viral mutation(s) within the areas targeted by this assay, and inadequate number of viral copies(<138 copies/mL). A negative result must be combined with clinical observations, patient history, and epidemiological information. The expected result is Negative.  Fact Sheet for Patients:   EntrepreneurPulse.com.au  Fact Sheet for Healthcare Providers:  IncredibleEmployment.be  This test is no t yet approved or cleared by the [place] FDA and  has been authorized for detection and/or diagnosis of SARS-CoV-2 by FDA under an Emergency Use Authorization (EUA). This EUA will remain  in effect (meaning this test can be used) for the duration of the COVID-19 declaration under Section 564(b)(1) of the Act, 21 U.S.C.section 360bbb-3(b)(1), unless the authorization is terminated  or revoked sooner.       Influenza A by  PCR NEGATIVE NEGATIVE Final   Influenza B by PCR NEGATIVE NEGATIVE Final    Comment: (NOTE) The Xpert Xpress SARS-CoV-2/FLU/RSV plus assay is intended as an aid in the diagnosis of influenza from Nasopharyngeal swab specimens and should not be used as a sole basis for treatment. Nasal washings and aspirates are unacceptable for Xpert Xpress SARS-CoV-2/FLU/RSV testing.  Fact Sheet for Patients: EntrepreneurPulse.com.au  Fact Sheet for Healthcare Providers: IncredibleEmployment.be  This test is not yet approved or cleared by the [place] FDA and has been authorized for detection and/or diagnosis of SARS-CoV-2 by FDA under an Emergency Use Authorization (EUA). This EUA will remain in effect (meaning this test can be used) for the duration of the COVID-19 declaration under Section 564(b)(1) of the Act, 21 U.S.C. section 360bbb-3(b)(1), unless the authorization is terminated or revoked.  Performed at [facility], [address] [place], [place] [address]       Radiology Studies: [facility] 1 View  Result Date: 04/21/2021 CLINICAL DATA:  Tachycardia and tachypnea EXAM: PORTABLE CHEST 1 VIEW COMPARISON:  Mar 09, 2021 FINDINGS: No edema or airspace opacity. Cardiomegaly again noted with pulmonary vascularity within normal limits. No adenopathy. No bone lesions. IMPRESSION:  Cardiomegaly again noted.  No edema or airspace opacity. Electronically Signed   By: Lowella Grip III M.D.   On: 04/21/2021 09:50   [name] EKG SITE RITE  Result Date: 04/21/2021 If Site Rite image not attached, placement could not be confirmed due to current cardiac rhythm.    Scheduled Meds:  amLODipine  10 mg Oral Daily   DULoxetine  60 mg Oral Daily   heparin  5,000 Units Subcutaneous Q8H   insulin aspart  0-5 Units Subcutaneous QHS   insulin aspart  0-9 Units Subcutaneous TID WC   insulin aspart  3 Units Subcutaneous TID WC   levETIRAcetam  750 mg Oral BID   metoprolol tartrate  50 mg Oral BID   oxybutynin  5 mg Oral QHS   pantoprazole  40 mg Oral Daily   rosuvastatin  10 mg Oral Daily   traZODone  50 mg Oral QHS   umeclidinium bromide  1 puff Inhalation Daily   zinc sulfate  220 mg Oral Daily   Continuous Infusions:  ceFEPime (MAXIPIME) IV 2 g (04/22/21 1118)   vancomycin 1,250 mg (04/22/21 1211)     LOS: 1 day   Time Spent in minutes   45 minutes  Vikkie Goeden D.O. on 04/22/2021 at 2:24 PM  Between 7am to 7pm - Please see pager noted on [email or web address]  After 7pm go to [email or web address]  And look for the night coverage person covering for me after hours  [facility] Office  [phone]

## 2021-04-23 ENCOUNTER — Encounter (HOSPITAL_COMMUNITY)
Admission: EM | Disposition: A | Discharge status: Skilled Nursing Facility | Payer: Self-pay | Source: Skilled Nursing Facility | Attending: Internal Medicine

## 2021-04-23 ENCOUNTER — Encounter (HOSPITAL_COMMUNITY): Payer: Self-pay | Admitting: Family Medicine

## 2021-04-23 ENCOUNTER — Inpatient Hospital Stay (HOSPITAL_COMMUNITY): Payer: Medicare Other | Admitting: Anesthesiology

## 2021-04-23 ENCOUNTER — Inpatient Hospital Stay (HOSPITAL_COMMUNITY): Payer: Medicare Other

## 2021-04-23 DIAGNOSIS — A419 Sepsis, unspecified organism: Secondary | ICD-10-CM | POA: Diagnosis not present

## 2021-04-23 DIAGNOSIS — I4891 Unspecified atrial fibrillation: Secondary | ICD-10-CM | POA: Diagnosis not present

## 2021-04-23 DIAGNOSIS — I70263 Atherosclerosis of native arteries of extremities with gangrene, bilateral legs: Secondary | ICD-10-CM | POA: Diagnosis not present

## 2021-04-23 DIAGNOSIS — I70223 Atherosclerosis of native arteries of extremities with rest pain, bilateral legs: Secondary | ICD-10-CM

## 2021-04-23 DIAGNOSIS — Z9181 History of falling: Secondary | ICD-10-CM | POA: Diagnosis not present

## 2021-04-23 HISTORY — PX: ANGIOPLASTY ILLIAC ARTERY: SHX5720

## 2021-04-23 HISTORY — PX: AORTOGRAM: SHX6300

## 2021-04-23 HISTORY — PX: LOWER EXTREMITY ANGIOGRAM: SHX5508

## 2021-04-23 LAB — BLOOD CULTURE ID PANEL (REFLEXED) - BCID2

## 2021-04-23 LAB — BASIC METABOLIC PANEL
Anion gap: 13 (ref 5–15)
BUN: 14 mg/dL (ref 6–20)
CO2: 17 mmol/L — ABNORMAL LOW (ref 22–32)
Calcium: 8.7 mg/dL — ABNORMAL LOW (ref 8.9–10.3)
Chloride: 108 mmol/L (ref 98–111)
Creatinine, Ser: 1.28 mg/dL — ABNORMAL HIGH (ref 0.44–1.00)
GFR, Estimated: 49 mL/min — ABNORMAL LOW (ref >60)
Glucose, Bld: 157 mg/dL — ABNORMAL HIGH (ref 70–99)
Potassium: 4 mmol/L (ref 3.5–5.1)
Sodium: 138 mmol/L (ref 135–145)

## 2021-04-23 LAB — MAGNESIUM: Magnesium: 2.1 mg/dL (ref 1.7–2.4)

## 2021-04-23 LAB — GLUCOSE, CAPILLARY
Glucose-Capillary: 160 mg/dL — ABNORMAL HIGH (ref 70–99)
Glucose-Capillary: 82 mg/dL (ref 70–99)
Glucose-Capillary: 95 mg/dL (ref 70–99)
Glucose-Capillary: 140 mg/dL — ABNORMAL HIGH (ref 70–99)

## 2021-04-23 LAB — PHOSPHORUS: Phosphorus: 2.9 mg/dL (ref 2.5–4.6)

## 2021-04-23 SURGERY — ANGIOGRAM, LOWER EXTREMITY
Anesthesia: General | Laterality: Right

## 2021-04-23 SURGERY — ABDOMINAL AORTOGRAM W/LOWER EXTREMITY
Anesthesia: LOCAL

## 2021-04-23 MED ORDER — FENTANYL CITRATE (PF) 250 MCG/5ML IJ SOLN
INTRAMUSCULAR | Status: AC
  Filled 2021-04-23: qty 5

## 2021-04-23 MED ORDER — 0.9 % SODIUM CHLORIDE (POUR BTL) OPTIME
TOPICAL | Status: DC | PRN
  Administered 2021-04-23: 1000 mL

## 2021-04-23 MED ORDER — PROPOFOL 10 MG/ML IV BOLUS
INTRAVENOUS | Status: DC | PRN
  Administered 2021-04-23: 100 mg via INTRAVENOUS

## 2021-04-23 MED ORDER — IODIXANOL 320 MG/ML IV SOLN
INTRAVENOUS | Status: DC | PRN
  Administered 2021-04-23: 50 mL
  Administered 2021-04-23: 2 mL

## 2021-04-23 MED ORDER — MIDAZOLAM HCL 5 MG/5ML IJ SOLN
INTRAMUSCULAR | Status: DC | PRN
  Administered 2021-04-23: 1 mg via INTRAVENOUS

## 2021-04-23 MED ORDER — CHLORHEXIDINE GLUCONATE 0.12 % MT SOLN
15.0000 mL | Freq: Once | OROMUCOSAL | Status: AC
Start: 2021-04-23 — End: 2021-04-23

## 2021-04-23 MED ORDER — ORAL CARE MOUTH RINSE: 15.0000 mL | Freq: Once | OROMUCOSAL | Status: AC

## 2021-04-23 MED ORDER — SODIUM CHLORIDE 0.9 % IV SOLN: INTRAVENOUS | Status: DC

## 2021-04-23 MED ORDER — PHENYLEPHRINE HCL-NACL 10-0.9 MG/250ML-% IV SOLN
INTRAVENOUS | Status: DC | PRN
  Administered 2021-04-23: 30 ug/min via INTRAVENOUS

## 2021-04-23 MED ORDER — PROPOFOL 10 MG/ML IV BOLUS
INTRAVENOUS | Status: AC
  Filled 2021-04-23: qty 20

## 2021-04-23 MED ORDER — ONDANSETRON HCL 4 MG/2ML IJ SOLN
INTRAMUSCULAR | Status: DC | PRN
  Administered 2021-04-23: 4 mg via INTRAVENOUS

## 2021-04-23 MED ORDER — MIDAZOLAM HCL 2 MG/2ML IJ SOLN
INTRAMUSCULAR | Status: AC
  Filled 2021-04-23: qty 2

## 2021-04-23 MED ORDER — LACTATED RINGERS IV SOLN: INTRAVENOUS | Status: DC

## 2021-04-23 MED ORDER — FENTANYL CITRATE (PF) 100 MCG/2ML IJ SOLN
INTRAMUSCULAR | Status: DC | PRN
  Administered 2021-04-23 (×2): 25 ug via INTRAVENOUS

## 2021-04-23 MED ORDER — PROTAMINE SULFATE 10 MG/ML IV SOLN
INTRAVENOUS | Status: DC | PRN
  Administered 2021-04-23: 10 mg via INTRAVENOUS
  Administered 2021-04-23: 40 mg via INTRAVENOUS

## 2021-04-23 MED ORDER — LIDOCAINE 2% (20 MG/ML) 5 ML SYRINGE
INTRAMUSCULAR | Status: DC | PRN
  Administered 2021-04-23: 60 mg via INTRAVENOUS

## 2021-04-23 MED ORDER — HEPARIN 6000 UNIT IRRIGATION SOLUTION
Status: DC | PRN
  Administered 2021-04-23 (×2): 1

## 2021-04-23 MED ORDER — CHLORHEXIDINE GLUCONATE 0.12 % MT SOLN
OROMUCOSAL | Status: AC
  Administered 2021-04-23: 15 mL via OROMUCOSAL
  Filled 2021-04-23: qty 15

## 2021-04-23 MED ORDER — HEPARIN SODIUM (PORCINE) 1000 UNIT/ML IJ SOLN
INTRAMUSCULAR | Status: DC | PRN
  Administered 2021-04-23: 8000 [IU] via INTRAVENOUS

## 2021-04-23 SURGICAL SUPPLY — 73 items
ADH SKN CLS APL DERMABOND .7 (GAUZE/BANDAGES/DRESSINGS) ×3
APL PRP STRL LF DISP 70% ISPRP (MISCELLANEOUS) ×3
BAG BANDED W/RUBBER/TAPE 36X54 (MISCELLANEOUS) ×4 IMPLANT
BAG COUNTER SPONGE SURGICOUNT (BAG) ×4 IMPLANT
BAG EQP BAND 135X91 W/RBR TAPE (MISCELLANEOUS) ×3
BAG SNAP BAND KOVER 36X36 (MISCELLANEOUS) ×4 IMPLANT
BAG SPNG CNTER NS LX DISP (BAG) ×3
BALLN MUSTANG 5X150X135 (BALLOONS) ×4
BALLN MUSTANG 6.0X40 135 (BALLOONS) ×4
BALLOON MUSTANG 5X150X135 (BALLOONS) IMPLANT
BALLOON MUSTANG 6.0X40 135 (BALLOONS) IMPLANT
BLADE SURG 11 STRL SS (BLADE) ×4 IMPLANT
CANISTER SUCT 3000ML PPV (MISCELLANEOUS) ×4 IMPLANT
CATH ANGIO 5F BER2 65CM (CATHETERS) IMPLANT
CATH CROSS OVER TEMPO 5F (CATHETERS) ×1 IMPLANT
CATH OMNI FLUSH .035X70CM (CATHETERS) IMPLANT
CATH OMNI FLUSH 5F 65CM (CATHETERS) ×1 IMPLANT
CATH QUICKCROSS .035X135CM (MICROCATHETER) ×1 IMPLANT
CATH QUICKCROSS SUPP .035X90CM (MICROCATHETER) ×1 IMPLANT
CHLORAPREP W/TINT 26 (MISCELLANEOUS) ×4 IMPLANT
CLOSURE PERCLOSE PROSTYLE (VASCULAR PRODUCTS) ×2 IMPLANT
COVER DOME SNAP 22 D (MISCELLANEOUS) ×5 IMPLANT
COVER PROBE W GEL 5X96 (DRAPES) ×4 IMPLANT
COVER SURGICAL LIGHT HANDLE (MISCELLANEOUS) ×4 IMPLANT
DERMABOND ADVANCED (GAUZE/BANDAGES/DRESSINGS) ×1
DERMABOND ADVANCED .7 DNX12 (GAUZE/BANDAGES/DRESSINGS) ×6 IMPLANT
DEVICE TORQUE H2O (MISCELLANEOUS) ×1 IMPLANT
DEVICE TORQUE KENDALL .025-038 (MISCELLANEOUS) IMPLANT
DRAPE FEMORAL ANGIO 80X135IN (DRAPES) ×4 IMPLANT
DRSG TEGADERM 2-3/8X2-3/4 SM (GAUZE/BANDAGES/DRESSINGS) ×1 IMPLANT
DRSG TEGADERM 4X4.75 (GAUZE/BANDAGES/DRESSINGS) ×1 IMPLANT
FILTER CO2 0.2 MICRON (VASCULAR PRODUCTS) ×1 IMPLANT
GAUZE 4X4 16PLY ~~LOC~~+RFID DBL (SPONGE) ×5 IMPLANT
GAUZE SPONGE 2X2 8PLY NS (GAUZE/BANDAGES/DRESSINGS) ×1 IMPLANT
GAUZE SPONGE 2X2 8PLY STRL LF (GAUZE/BANDAGES/DRESSINGS) IMPLANT
GLIDEWIRE ADV .035X260CM (WIRE) ×1 IMPLANT
GLOVE SURG MICRO LTX SZ7.5 (GLOVE) ×4 IMPLANT
GOWN STRL REUS W/ TWL LRG LVL3 (GOWN DISPOSABLE) ×6 IMPLANT
GOWN STRL REUS W/ TWL XL LVL3 (GOWN DISPOSABLE) ×3 IMPLANT
GOWN STRL REUS W/TWL LRG LVL3 (GOWN DISPOSABLE) ×8
GOWN STRL REUS W/TWL XL LVL3 (GOWN DISPOSABLE) ×4
GUIDEWIRE ANGLED .035X150CM (WIRE) IMPLANT
GUIDEWIRE ANGLED .035X260CM (WIRE) ×1 IMPLANT
KIT BASIN OR (CUSTOM PROCEDURE TRAY) ×4 IMPLANT
KIT ENCORE 26 ADVANTAGE (KITS) ×1 IMPLANT
KIT TURNOVER KIT B (KITS) ×4 IMPLANT
NDL PERC 18GX7CM (NEEDLE) ×3 IMPLANT
NEEDLE PERC 18GX7CM (NEEDLE) ×4 IMPLANT
NS IRRIG 1000ML POUR BTL (IV SOLUTION) IMPLANT
PACK SURGICAL SETUP 50X90 (CUSTOM PROCEDURE TRAY) ×4 IMPLANT
PAD ARMBOARD 7.5X6 YLW CONV (MISCELLANEOUS) ×8 IMPLANT
PROTECTION STATION PRESSURIZED (MISCELLANEOUS) ×4
RESERVOIR CO2 (VASCULAR PRODUCTS) ×1 IMPLANT
SET FLUSH CO2 (MISCELLANEOUS) ×1 IMPLANT
SET MICROPUNCTURE 5F STIFF (MISCELLANEOUS) ×4 IMPLANT
SHEATH AVANTI 11CM 5FR (SHEATH) IMPLANT
SHEATH FLEX ANSEL ANG 6F 45CM (SHEATH) ×1 IMPLANT
SHEATH PINNACLE 6F 10CM (SHEATH) ×1 IMPLANT
SHEATH PINNACLE ST 6F 65CM (SHEATH) ×1 IMPLANT
SPONGE GAUZE 2X2 STER 10/PKG (GAUZE/BANDAGES/DRESSINGS) ×1
STATION PROTECTION PRESSURIZED (MISCELLANEOUS) ×3 IMPLANT
STENT ELUVIA 6X120X130 (Permanent Stent) ×1 IMPLANT
STENT ELUVIA 6X60X130 (Permanent Stent) ×1 IMPLANT
STOPCOCK MORSE 400PSI 3WAY (MISCELLANEOUS) ×4 IMPLANT
SYR 10ML LL (SYRINGE) ×12 IMPLANT
SYR 20ML LL LF (SYRINGE) ×4 IMPLANT
SYR 30ML LL (SYRINGE) ×4 IMPLANT
SYR MEDRAD MARK V 150ML (SYRINGE) IMPLANT
TOWEL GREEN STERILE (TOWEL DISPOSABLE) ×8 IMPLANT
TUBING HIGH PRESSURE 120CM (CONNECTOR) ×4 IMPLANT
UNDERPAD 30X36 HEAVY ABSORB (UNDERPADS AND DIAPERS) IMPLANT
WIRE BENTSON .035X145CM (WIRE) ×4 IMPLANT
WIRE ROSEN-J .035X260CM (WIRE) ×1 IMPLANT

## 2021-04-23 NOTE — Progress Notes (Signed)
[facility] [place] PROGRESS NOTE  ASSESSMENT / PLAN: [name] is a 55 y.o. female with critical limb ischemia of bilateral lower extremities L>R. There is a right popliteal pulse deficit. Plan left common femoral artery access, aortogram, and right lower extremity angiography with possible intervention in cath lab today.  SUBJECTIVE: Odd affect. Understands risks / benefits / alternatives to intervention today. Sisters at bedside.  OBJECTIVE: BP (!) 87/69 (BP Location: Left Arm) Comment: RN aware  Pulse 73   Temp 97.9 F (36.6 C) (Oral)   Resp 10   Ht 5\' 1"  (1.549 m)   Wt 76.8 kg   LMP 05/19/2016   SpO2 98%   BMI 31.99 kg/m   Intake/Output Summary (Last 24 hours) at 04/23/2021 1024 Last data filed at 04/22/2021 2034 Gross per 24 hour  Intake 120 ml  Output 200 ml  Net -80 ml    Trace R popliteal pulse 2+ L popliteal pulse No pedal pulses Unchanged wounds about the ankles bilaterally  CBC Latest Ref Rng & Units 04/22/2021 04/21/2021 04/03/2021  WBC 4.0 - 10.5 K/uL 8.2 12.8(H) 12.8(H)  Hemoglobin 12.0 - 15.0 g/dL 10.3(L) 10.8(L) 9.2(L)  Hematocrit 36.0 - 46.0 % 34.7(L) 36.9 30.6(L)  Platelets 150 - 400 K/uL 345 359 529(H)     CMP Latest Ref Rng & Units 04/23/2021 04/22/2021 04/21/2021  Glucose 70 - 99 mg/dL 157(H) 134(H) 165(H)  BUN 6 - 20 mg/dL 14 12 13   Creatinine 0.44 - 1.00 mg/dL 1.28(H) 1.33(H) 1.33(H)  Sodium 135 - 145 mmol/L 138 140 140  Potassium 3.5 - 5.1 mmol/L 4.0 3.5 2.6(LL)  Chloride 98 - 111 mmol/L 108 107 99  CO2 22 - 32 mmol/L 17(L) 23 27  Calcium 8.9 - 10.3 mg/dL 8.7(L) 8.3(L) 8.7(L)  Total Protein 6.5 - 8.1 g/dL - 6.2(L) 7.1  Total Bilirubin 0.3 - 1.2 mg/dL - 0.6 1.0  Alkaline Phos 38 - 126 U/L - 66 82  AST 15 - 41 U/L - 10(L) 12(L)  ALT 0 - 44 U/L - 6 9    Estimated Creatinine Clearance: 46 mL/min (A) (by C-G formula based on SCr of 1.28 mg/dL (H)).  [name]. Stanford Breed, MD [facility] Phone Number: [phone] 04/23/2021 10:24 AM

## 2021-04-23 NOTE — Progress Notes (Signed)
Pt has refused to sign consent. She is also refusing labs this AM. Is very verbally aggressive to nursing staff this AM. Is raising her voice to nursing and states "You can draw my labs when I say you can". MD notified.

## 2021-04-23 NOTE — Progress Notes (Signed)
OR tech here to pick up pt for surgery pt. being very difficult and wanted her sister to be here. Got her daughter on the phone to speak with her and she got very upset on the phone then upset with this RN

## 2021-04-23 NOTE — Progress Notes (Signed)
Dr. Stanford Breed made aware that both of the patient's blood cultures drawn at [facility] from the anaerobic bottle came back with gram positive cocci; MD verbalized understanding.

## 2021-04-23 NOTE — Progress Notes (Signed)
Called daughter to make her aware they were taking pt for surgery, they said they couldn't make it back at this point.

## 2021-04-23 NOTE — Progress Notes (Signed)
PHARMACY - PHYSICIAN COMMUNICATION CRITICAL VALUE ALERT - BLOOD CULTURE IDENTIFICATION (BCID)  [name] is an 55 y.o. female who presented to [facility] on 04/21/2021 with a chief complaint of bilateral leg pain with ulceration and surrounding cellulitis, now s/p LLE angiogram and right femoral-popliteal artery stenting on 6/29.  Assessment: 2 of 4 bottles grew Staph spp thus far, both in anaerobic bottle.  Name of physician (or Provider) Contacted: Dr. Nevada Crane  Current antibiotics: vancomycin and cefepime   Changes to prescribed antibiotics recommended:  Patient is on recommended antibiotics - No changes needed F/U narrow once cellulitis improves   Results for orders placed or performed during the hospital encounter of 04/21/21  Blood Culture ID Panel (Reflexed) (Collected: 04/21/2021 10:19 AM)  Result Value Ref Range   Enterococcus faecalis NOT DETECTED NOT DETECTED   Enterococcus Faecium NOT DETECTED NOT DETECTED   Listeria monocytogenes NOT DETECTED NOT DETECTED   Staphylococcus species DETECTED (A) NOT DETECTED   Staphylococcus aureus (BCID) NOT DETECTED NOT DETECTED   Staphylococcus epidermidis NOT DETECTED NOT DETECTED   Staphylococcus lugdunensis NOT DETECTED NOT DETECTED   Streptococcus species NOT DETECTED NOT DETECTED   Streptococcus agalactiae NOT DETECTED NOT DETECTED   Streptococcus pneumoniae NOT DETECTED NOT DETECTED   Streptococcus pyogenes NOT DETECTED NOT DETECTED   A.calcoaceticus-baumannii NOT DETECTED NOT DETECTED   Bacteroides fragilis NOT DETECTED NOT DETECTED   Enterobacterales NOT DETECTED NOT DETECTED   Enterobacter cloacae complex NOT DETECTED NOT DETECTED   Escherichia coli NOT DETECTED NOT DETECTED   Klebsiella aerogenes NOT DETECTED NOT DETECTED   Klebsiella oxytoca NOT DETECTED NOT DETECTED   Klebsiella pneumoniae NOT DETECTED NOT DETECTED   Proteus species NOT DETECTED NOT DETECTED   Salmonella species NOT DETECTED NOT DETECTED   Serratia  marcescens NOT DETECTED NOT DETECTED   Haemophilus influenzae NOT DETECTED NOT DETECTED   Neisseria meningitidis NOT DETECTED NOT DETECTED   Pseudomonas aeruginosa NOT DETECTED NOT DETECTED   Stenotrophomonas maltophilia NOT DETECTED NOT DETECTED   Candida albicans NOT DETECTED NOT DETECTED   Candida auris NOT DETECTED NOT DETECTED   Candida glabrata NOT DETECTED NOT DETECTED   Candida krusei NOT DETECTED NOT DETECTED   Candida parapsilosis NOT DETECTED NOT DETECTED   Candida tropicalis NOT DETECTED NOT DETECTED   Cryptococcus neoformans/gattii NOT DETECTED NOT DETECTED    Drae Mitzel D. Mina Marble, PharmD, BCPS, [place] 04/23/2021, 7:03 PM

## 2021-04-23 NOTE — Progress Notes (Signed)
PROGRESS NOTE    [name]  JHE:[id number] DOB: [birth year] DOA: 04/21/2021 PCP: Caprice Renshaw, MD    Brief Narrative:  [name] was admitted with the working diagnosis of sepsis due to bilateral lower extremity leg ulcerations with cellulitis and dry gangrene   55 year old female past medical history for tobacco abuse, severe peripheral vascular disease, paroxysmal atrial fibrillation, hypertension, dyslipidemia, type 2 diabetes mellitus, diastolic heart failure, chronic kidney disease who presented with bilateral leg pain.  She was noted to have decreased p.o. intake at her nursing facility.  She does have chronic ulcerated lesions bilateral legs.  On her initial physical examination temperature 98.1, heart rate 150, respiratory rate 22, heart rate 90, blood pressure 116/92, oxygen saturation 93%, her lungs were clear to auscultation bilaterally, heart S1-S2, present, rhythmic, soft abdomen, positive pressure ulcers ventral side of distal lower extremities.  Stage IV pressure ulcers with necrotic skin.   Patient was placed on broad-spectrum antibiotic therapy with vancomycin and cefepime. Vascular surgery and orthopedic were consulted.  Patient was scheduled to have left common femoral artery access aortogram and right lower extremity angiography but patient declined to proceed on the Cath Lab table. This was discussed with the sister, and procedure will be reattempted under general anesthesia.   Assessment & Plan:   Principal Problem:   Sepsis ([place]) Active Problems:   Essential hypertension   INSOMNIA   Medical non-compliance   Poor mobility   Lower extremity weakness   HTN (hypertension), malignant   Hypokalemia   Diabetic neuropathy (HCC)   CKD (chronic kidney disease) stage 3, GFR 30-59 ml/min (HCC)   Severe recurrent major depression without psychotic features (HCC)   COPD (chronic obstructive pulmonary disease) (HCC)   At high risk for falls   PAF (paroxysmal atrial  fibrillation) (HCC)   Atrial fibrillation with RVR (HCC)   Seizures (HCC)   Sacral ulcer, limited to breakdown of skin (HCC)   Reflux esophagitis   Multiple duodenal ulcers   Chronic respiratory failure with hypoxia (HCC)   Hypomagnesemia   PVD (peripheral vascular disease) (HCC)   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 (Present on admission) Patient today for angiography and stenting on right superficial and popliteal artery.  Continue antibiotic therapy, follow up on surgical recommendations.  Continue pain control with morphine and oxycodone.   2. T2DM. Continue glucose cover and monitoring with insulin sliding scale. Patient tolerating po well.   3. Chronic hypoxemic respiratory failure. Continue supplemental 02 per [place]  4. Paroxysmal atrial fibrillation. Continue rate control with metoprolol and anticoagulation with apixaban  5., HTN Dyslipidemia.  Continue with statin therapy Continue blood pressure control with amlodipine and metoprolol.   6. CKD stage 3b to 4. Close follow up on renal function and electrolytes, post contrast exposure.   7. Depression. Continue with lorazepam, duloxetine and trazodone   Patient continue to be at high risk for worsening cellulitis   Status is: Inpatient  Remains inpatient appropriate because:IV treatments appropriate due to intensity of illness or inability to take PO  Dispo: The patient is from: Home              Anticipated d/c is to: Home              Patient currently is not medically stable to d/c.   Difficult to place patient No   DVT prophylaxis:  Apixaban   Code Status:   full  Family Communication:  No family at the bedside      Consultants:  Vascular surgery  Orthopedics   Procedures:  Left lower extremity angiography and stenting   Antimicrobials:  Vancomycin   Cefepime     Subjective: Patient continue to have pain, no  nausea or vomiting, no dyspnea or chest pain   Objective: Vitals:   04/23/21 0400 04/23/21 0740 04/23/21 1055 04/23/21 1309  BP: 114/80 (!) 87/69 128/86 116/79  Pulse:   81 84  Resp: 15 10 20 18   Temp: 97.8 F (36.6 C) 97.9 F (36.6 C) 97.8 F (36.6 C) 98.4 F (36.9 C)  TempSrc: Oral Oral Oral Oral  SpO2:  98% 98% 98%  Weight: 76.8 kg   76.8 kg  Height:    5\' 1"  (1.549 m)    Intake/Output Summary (Last 24 hours) at 04/23/2021 1643 Last data filed at 04/23/2021 1322 Gross per 24 hour  Intake 0 ml  Output 400 ml  Net -400 ml   Filed Weights   04/22/21 0314 04/23/21 0400 04/23/21 1309  Weight: 76.9 kg 76.8 kg 76.8 kg    Examination:   General: Not in pain or dyspnea, deconditioned  Neurology: Awake and alert, non focal  E ENT: no pallor, no icterus, oral mucosa moist Cardiovascular: No JVD. S1-S2 present, rhythmic, no gallops, rubs, or murmurs. No lower extremity edema. Pulmonary: positive breath sounds bilaterally, adequate air movement, no wheezing, rhonchi or rales. Gastrointestinal. Abdomen soft and non tender Skin. Bilateral leg distal rash, and ulceration  Musculoskeletal: no joint deformities     Data Reviewed: I have personally reviewed following labs and imaging studies  CBC: Recent Labs  Lab 04/21/21 1019 04/22/21 0257  WBC 12.8* 8.2  NEUTROABS 10.1* 6.6  HGB 10.8* 10.3*  HCT 36.9 34.7*  MCV 95.6 94.0  PLT 359 283   Basic Metabolic Panel: Recent Labs  Lab 04/21/21 1019 04/22/21 0257 04/23/21 0637  NA 140 140 138  K 2.6* 3.5 4.0  CL 99 107 108  CO2 27 23 17*  GLUCOSE 165* 134* 157*  BUN 13 12 14   CREATININE 1.33* 1.33* 1.28*  CALCIUM 8.7* 8.3* 8.7*  MG 1.5*  TEST WILL BE CREDITED 2.3 2.1  PHOS  --  3.3 2.9   GFR: Estimated Creatinine Clearance: 46 mL/min (A) (by C-G formula based on SCr of 1.28 mg/dL (H)). Liver Function Tests: Recent Labs  Lab 04/21/21 1019 04/22/21 0257  AST  12* 10*  ALT 9 6  ALKPHOS 82 66  BILITOT 1.0 0.6  PROT 7.1 6.2*  ALBUMIN 2.3* 1.8*   No results for input(s): LIPASE, AMYLASE in the last 168 hours. No results for input(s): AMMONIA in the last 168 hours. Coagulation Profile: Recent Labs  Lab 04/21/21 1019  INR 1.7*   Cardiac Enzymes: No results for input(s): CKTOTAL, CKMB, CKMBINDEX, TROPONINI in the last 168 hours. BNP (last 3 results) No results for input(s): PROBNP in the last 8760 hours. HbA1C: No results for input(s): HGBA1C in the last 72 hours. CBG: Recent Labs  Lab 04/22/21 1607 04/22/21 2110 04/23/21 0642 04/23/21 1130 04/23/21 1308  GLUCAP 99 150* 160* 82 95   Lipid Profile: No results for input(s): CHOL, HDL, LDLCALC, TRIG, CHOLHDL, LDLDIRECT in the last 72 hours. Thyroid Function Tests: No results for input(s): TSH, T4TOTAL, FREET4, T3FREE, THYROIDAB in the last 72 hours. Anemia Panel: No results for input(s): VITAMINB12, FOLATE, FERRITIN, TIBC, IRON, RETICCTPCT in the last  72 hours.    Radiology Studies: I have reviewed all of the imaging during this hospital visit personally     Scheduled Meds:  [MAR Hold] amLODipine  10 mg Oral Daily   [MAR Hold] DULoxetine  60 mg Oral Daily   [MAR Hold] heparin  5,000 Units Subcutaneous Q8H   [MAR Hold] insulin aspart  0-5 Units Subcutaneous QHS   [MAR Hold] insulin aspart  0-9 Units Subcutaneous TID WC   [MAR Hold] insulin aspart  3 Units Subcutaneous TID WC   [MAR Hold] levETIRAcetam  750 mg Oral BID   [MAR Hold] metoprolol tartrate  50 mg Oral BID   [MAR Hold] oxybutynin  5 mg Oral QHS   [MAR Hold] pantoprazole  40 mg Oral Daily   [MAR Hold] rosuvastatin  10 mg Oral Daily   [MAR Hold] traZODone  50 mg Oral QHS   [MAR Hold] umeclidinium bromide  1 puff Inhalation Daily   [MAR Hold] zinc sulfate  220 mg Oral Daily   Continuous Infusions:  sodium chloride 100 mL/hr at 04/23/21 1457   [MAR Hold] ceFEPime (MAXIPIME) IV 2 g (04/23/21 0910)   lactated  ringers 10 mL/hr at 04/23/21 1457   [MAR Hold] vancomycin 1,250 mg (04/23/21 1213)     LOS: 2 days        Yazmeen Woolf Gerome Apley, MD

## 2021-04-23 NOTE — Anesthesia Procedure Notes (Signed)
Procedure Name: LMA Insertion Date/Time: 04/23/2021 3:13 PM Performed by: Griffin Dakin, CRNA Pre-anesthesia Checklist: Patient identified, Emergency Drugs available, Suction available, Patient being monitored and Timeout performed Patient Re-evaluated:Patient Re-evaluated prior to induction Oxygen Delivery Method: Circle system utilized Preoxygenation: Pre-oxygenation with 100% oxygen Induction Type: IV induction Ventilation: Mask ventilation without difficulty LMA: LMA inserted LMA Size: 4.0 Placement Confirmation: positive ETCO2 and breath sounds checked- equal and bilateral Tube secured with: Tape Dental Injury: Teeth and Oropharynx as per pre-operative assessment

## 2021-04-23 NOTE — Progress Notes (Signed)
Pt returned to the unit, glasses and dentures at the bedside. Pt refused to have her CBG checked and and refused to keep her lt leg straight post surgery. Educated pt of the importance. Denies wanting any food or drink at this time. Placed on Tele

## 2021-04-23 NOTE — Progress Notes (Signed)
Pt upset I needed to assess groin site, explained the importance and took a quick look is all she would allow. No Bleeding noted

## 2021-04-23 NOTE — Op Note (Addendum)
DATE OF SERVICE: 04/23/2021  PATIENT:  [name]  55 y.o. female  PRE-OPERATIVE DIAGNOSIS:  Atherosclerosis of native arteries of right lower extremity lower extremity causing ulceration  POST-OPERATIVE DIAGNOSIS:  Same  PROCEDURE:   1) US guided left common femoral artery access 2) Aortogram 3) left lower extremity angiogram 4) right lower extremity angiogram with third order cannulation (56mL total contrast) 5) right superficial femoral / popliteal artery stenting (6x126mm + 6x60mm Blake Divine)  SURGEON:  Surgeon(s) and Role:    * Cherre Robins, MD - Primary  ASSISTANT: none  ANESTHESIA:   general  EBL: min  BLOOD ADMINISTERED:none  DRAINS: none   LOCAL MEDICATIONS USED:  NONE  SPECIMEN:  none  COUNTS: confirmed correct .  TOURNIQUET:  none  PATIENT DISPOSITION:  PACU - hemodynamically stable.   Delay start of Pharmacological VTE agent (>24hrs) due to surgical blood loss or risk of bleeding: no  INDICATION FOR PROCEDURE: [name] is a 54 y.o. female with ulceration about her bilateral ankles. After careful discussion of risks, benefits, and alternatives the patient was offered angiography with intervention. We specifically discussed access site complications. The patient understood and wished to proceed.  OPERATIVE FINDINGS:  Aorta without flow limiting stenosis Bilateral iliac vessels patent without flow limiting stenosis  Right lower extremity Common and profunda femoris arteries patent without flow limiting stenosis Right SFA occluded proximal 1/3 of thigh Reconstitution of distal SFA / proximal popliteal artery Three vessel runoff to the ankle  Left lower extremity Common and profunda femoris arteries patent without flow limiting stenosis Left SFA diffusely diseased, greatest 70% stenosis No flow limiting disease in below knee popliteal  Diffusely diseased tibial vessels AT courses to the foot and is dominant.  DESCRIPTION OF PROCEDURE:  After identification of the patient in the pre-operative holding area, the patient was transferred to the operating room. The patient was positioned supine on the operating room table. Anesthesia was induced. The groins was prepped and draped in standard fashion. A surgical pause was performed confirming correct patient, procedure, and operative location.  The left groin was anesthetized with subcutaneous injection of 1% lidocaine. Using ultrasound guidance, the left common femoral artery was accessed with micropuncture technique. Fluoroscopy was used to confirm cannulation over the femoral head. Sheathogram was not performed. The 1F sheath was upsized to 53F.   An 035 glidewire advantage was advanced into the distal aorta. Over the wire an omni flush catheter was advanced to the level of L2. Aortogram was performed - see above for details.   The right common iliac artery was selected with the 035 glidewire advantage. The wire was advanced into the common femoral artery. Over the wire the omni flush catheter was advanced into the external iliac artery. Selective angiography was performed - see above for details.   The decision was made to intervene. The patient was heparinized with 8000 units of heparin. The sheath was exchanged for a 44F x 45cm sheath. Selective angiography of the left lower extremity was performed prior to intervention. The lesions were treated with drug-eluting stenting (6x157mm + 6x74mm Eluvia)  Completion angiography revealed:  Resolution of the right SFA lesion  Left lower extremity angiography was then performed via retrograde injection from a 44F x 10cm sheath. See findings above.  A perclose device was used to close the arteriotomy. Hemostasis was excellent upon completion.  Upon completion of the case instrument and sharps counts were confirmed correct. The patient was transferred to the PACU in good condition.  I was present for all portions of the procedure.  PLAN: Needs  ASA and Statin therapy indefinitely. Resume Eliquis. Needs LLE intervention in near future.   [name]. Stanford Breed, MD [facility] Phone Number: [phone] 04/23/2021 5:39 PM

## 2021-04-23 NOTE — Progress Notes (Signed)
Patient refused to proceed with procedure on the cath lab table. I discussed the issues with the patient and sister. Will try to do this under general anesthesia this afternoon. Keep NPO.  [name]. Stanford Breed, MD [facility] Phone Number: [phone] 04/23/2021 10:47 AM

## 2021-04-23 NOTE — Transfer of Care (Signed)
Immediate Anesthesia Transfer of Care Note  Patient: [name]  Procedure(s) Performed: RIGHT  AND LEFT LOWER EXTREMITY ANGIOGRAM (Bilateral) ANGIOPLASTY AND STENT SUPERFICIAL FEMORAL ARTERY (Right) AORTOGRAM  Patient Location: PACU  Anesthesia Type:General  Level of Consciousness: awake  Airway & Oxygen Therapy: Patient Spontanous Breathing and Patient connected to nasal cannula oxygen  Post-op Assessment: Report given to RN and Post -op Vital signs reviewed and stable  Post vital signs: Reviewed and stable  Last Vitals:  Vitals Value Taken Time  BP 119/89 04/23/21 1715  Temp    Pulse 81 04/23/21 1717  Resp 8 04/23/21 1718  SpO2 94 % 04/23/21 1717  Vitals shown include unvalidated device data.  Last Pain:  Vitals:   04/23/21 1309  TempSrc: Oral  PainSc: 7       Patients Stated Pain Goal: 1 (31/42/76 7011)  Complications: No notable events documented.

## 2021-04-23 NOTE — Anesthesia Preprocedure Evaluation (Addendum)
Anesthesia Evaluation  Patient identified by MRN, date of birth, ID band Patient awake    Reviewed: Allergy & Precautions, NPO status , Patient's Chart, lab work & pertinent test results  Airway Mallampati: II  TM Distance: >3 FB Neck ROM: Full    Dental  (+) Edentulous Upper, Edentulous Lower   Pulmonary sleep apnea, Continuous Positive Airway Pressure Ventilation and Oxygen sleep apnea , COPD,  COPD inhaler and oxygen dependent, Current Smoker and Patient abstained from smoking.,    Pulmonary exam normal breath sounds clear to auscultation       Cardiovascular hypertension, Pt. on medications and Pt. on home beta blockers + Peripheral Vascular Disease and +CHF  Normal cardiovascular exam+ dysrhythmias  Rhythm:Regular Rate:Normal  ECHO: 1. Left ventricular ejection fraction, by estimation, is 55 to 60%. The left ventricle has normal function. The left ventricle has no regional wall motion abnormalities. There is moderate left ventricular hypertrophy.  Left ventricular diastolic parameters were normal.  2. Right ventricular systolic function is normal. The right ventricular size is normal. There is normal pulmonary artery systolic pressure.  3. Left atrial size was moderately dilated.  4. The pericardial effusion is lateral to the left ventricle.  5. The mitral valve is normal in structure. Trivial mitral valve regurgitation. No evidence of mitral stenosis.  6. The aortic valve is normal in structure. Aortic valve regurgitation is not visualized. No aortic stenosis is present.  7. The inferior vena cava is normal in size with greater than 50% respiratory variability, suggesting right atrial pressure of 3 mmHg.   Neuro/Psych Seizures -,  PSYCHIATRIC DISORDERS Anxiety Depression CVA    GI/Hepatic PUD, (+)     substance abuse  ,   Endo/Other  diabetes, Insulin Dependent  Renal/GU CRFRenal disease     Musculoskeletal negative  musculoskeletal ROS (+)   Abdominal (+) + obese,   Peds  Hematology  (+) anemia , HLD   Anesthesia Other Findings Sepsis  Reproductive/Obstetrics                            Anesthesia Physical Anesthesia Plan  ASA: 4  Anesthesia Plan: General   Post-op Pain Management:    Induction: Intravenous  PONV Risk Score and Plan: 2 and Ondansetron, Dexamethasone, Treatment may vary due to age or medical condition and Midazolam  Airway Management Planned: LMA  Additional Equipment:   Intra-op Plan:   Post-operative Plan: Extubation in OR  Informed Consent: I have reviewed the patients History and Physical, chart, labs and discussed the procedure including the risks, benefits and alternatives for the proposed anesthesia with the patient or authorized representative who has indicated his/her understanding and acceptance.       Plan Discussed with: CRNA  Anesthesia Plan Comments:         Anesthesia Quick Evaluation

## 2021-04-23 NOTE — Progress Notes (Signed)
Pt returned back to room. Tech entered room to get Pt reconnected to telemetry cords and Pt also wet with urine. Tech attempted to clean Pt and Pt refused stating that she wants to wait on her sister to clean her. Tech Chiropodist.

## 2021-04-24 ENCOUNTER — Encounter (HOSPITAL_COMMUNITY): Payer: Self-pay | Admitting: Vascular Surgery

## 2021-04-24 DIAGNOSIS — A419 Sepsis, unspecified organism: Secondary | ICD-10-CM | POA: Diagnosis not present

## 2021-04-24 DIAGNOSIS — I4891 Unspecified atrial fibrillation: Secondary | ICD-10-CM | POA: Diagnosis not present

## 2021-04-24 DIAGNOSIS — I70263 Atherosclerosis of native arteries of extremities with gangrene, bilateral legs: Secondary | ICD-10-CM | POA: Diagnosis not present

## 2021-04-24 DIAGNOSIS — Z9181 History of falling: Secondary | ICD-10-CM | POA: Diagnosis not present

## 2021-04-24 LAB — GLUCOSE, CAPILLARY
Glucose-Capillary: 136 mg/dL — ABNORMAL HIGH (ref 70–99)
Glucose-Capillary: 96 mg/dL (ref 70–99)
Glucose-Capillary: 135 mg/dL — ABNORMAL HIGH (ref 70–99)
Glucose-Capillary: 97 mg/dL (ref 70–99)

## 2021-04-24 MED ORDER — VANCOMYCIN HCL 1250 MG/250ML IV SOLN
1250.0000 mg | INTRAVENOUS | Status: DC
  Administered 2021-04-24 – 2021-04-25 (×2): 1250 mg via INTRAVENOUS
  Filled 2021-04-24 (×2): qty 250

## 2021-04-24 NOTE — Progress Notes (Signed)
Pt refusing labs and vitals at this time. MD notified.

## 2021-04-24 NOTE — Anesthesia Postprocedure Evaluation (Signed)
Anesthesia Post Note  Patient: [name]  Procedure(s) Performed: RIGHT  AND LEFT LOWER EXTREMITY ANGIOGRAM (Bilateral) ANGIOPLASTY AND STENT SUPERFICIAL FEMORAL ARTERY (Right) AORTOGRAM     Patient location during evaluation: PACU Anesthesia Type: General Level of consciousness: awake Pain management: pain level controlled Vital Signs Assessment: post-procedure vital signs reviewed and stable Respiratory status: spontaneous breathing, nonlabored ventilation, respiratory function stable and patient connected to nasal cannula oxygen Cardiovascular status: blood pressure returned to baseline and stable Postop Assessment: no apparent nausea or vomiting Anesthetic complications: no   No notable events documented.  Last Vitals:  Vitals:   04/24/21 0310 04/24/21 1153  BP: 101/73 117/83  Pulse:  88  Resp: 19 16  Temp:  36.7 C  SpO2: 100% 98%    Last Pain:  Vitals:   04/24/21 1153  TempSrc: Oral  PainSc:                  [name] P Lena Gores

## 2021-04-24 NOTE — Progress Notes (Signed)
Phlebotomy called, pt refused blood cultures. MD notified.

## 2021-04-24 NOTE — Progress Notes (Signed)
Pt refusing lab draws this AM. MD notified.

## 2021-04-24 NOTE — Plan of Care (Signed)

## 2021-04-24 NOTE — Progress Notes (Signed)
PROGRESS NOTE    SHERRIL SHIPMAN  HFW:[id number] DOB: 11-20-1965 DOA: 04/21/2021 PCP: Caprice Renshaw, MD    Brief Narrative:  Mrs. Rimmer was admitted with the working diagnosis of sepsis due to bilateral lower extremity leg ulcerations with cellulitis and dry gangrene    55 year old female past medical history for tobacco abuse, severe peripheral vascular disease, paroxysmal atrial fibrillation, hypertension, dyslipidemia, type 2 diabetes mellitus, diastolic heart failure, chronic kidney disease who presented with bilateral leg pain.  She was noted to have decreased p.o. intake at her nursing facility.  She does have chronic ulcerated lesions bilateral legs.  On her initial physical examination temperature 98.1, heart rate 150, respiratory rate 22, heart rate 90, blood pressure 116/92, oxygen saturation 93%, her lungs were clear to auscultation bilaterally, heart S1-S2, present, rhythmic, soft abdomen, positive pressure ulcers ventral side of distal lower extremities.  Stage IV pressure ulcers with necrotic skin.    Patient was placed on broad-spectrum antibiotic therapy with vancomycin and cefepime. Vascular surgery and orthopedic were consulted.   Patient was scheduled to have left common femoral artery access aortogram and right lower extremity angiography but patient declined to proceed on the Cath Lab table. This was discussed with the sister, and procedure will be reattempted under general anesthesia.    06/29 Sp right superficial femoral and popliteal artery stenting   Positive blood cultures for staph aureus #2/2   Assessment & Plan:   Principal Problem:   Sepsis ([place]) Active Problems:   Essential hypertension   INSOMNIA   Medical non-compliance   Poor mobility   Lower extremity weakness   HTN (hypertension), malignant   Hypokalemia   Diabetic neuropathy (HCC)   CKD (chronic kidney disease) stage 3, GFR 30-59 ml/min (HCC)   Severe recurrent major depression without  psychotic features (HCC)   COPD (chronic obstructive pulmonary disease) (HCC)   At high risk for falls   PAF (paroxysmal atrial fibrillation) (HCC)   Atrial fibrillation with RVR (HCC)   Seizures (HCC)   Sacral ulcer, limited to breakdown of skin (HCC)   Reflux esophagitis   Multiple duodenal ulcers   Chronic respiratory failure with hypoxia (HCC)   Hypomagnesemia   PVD (peripheral vascular disease) (HCC)   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 (Present on admission)/ Staphylococcus bacteremia.   Pain is well controlled with analgesics, now blood work today.  She has remained afebrile.  Blood cultures #2/2 positive for staphylococcus.  Plan to continue medical therapy with IV vancomycin and follow sensitivities  Further work up with echocardiogram.  Repeat blood cultures today.     2. T2DM. Capillary glucose has been controlled 140, 136 and 96. Plan to continue with insulin sliding scale for glucose cover and monitoring.   3. Chronic hypoxemic respiratory failure. On supplemental 02 per [place]   4. Paroxysmal atrial fibrillation. Rate has been well controlled, Ok to discontinue telemetry monitoring. Continue with metoprolol and anticoagulation with apixaban   5., HTN Dyslipidemia.  On statin therapy Amlodipine and metoprolol for blood pressure control.    6. CKD stage 3b. Pending renal function today, she declined blood work. Need to follow close renal function, post contrast exposure and on IV vancomycin    7. Depression. On lorazepam, duloxetine and trazodone      Patient continue to be at high risk for worsening bacteremia   Status is: Inpatient  Remains inpatient appropriate because:Inpatient level of care appropriate due to severity of illness  Dispo: The patient is from: Home              Anticipated d/c is to: Home              Patient  currently is not medically stable to d/c.   Difficult to place patient No   DVT prophylaxis: Apixaban   Code Status:   full  Family Communication:  No family at the bedside       Skin Documentation:  Consultants:  Vascular surgery  Orthopedics   Procedures:  Left lower extremity angioplasty with stent left superficial femoral   Antimicrobials:  IV vancomycin     Subjective: Patient is not very interactive, not in pain or dyspnea, no nausea or vomiting.   Objective: Vitals:   04/23/21 2324 04/24/21 0215 04/24/21 0310 04/24/21 1153  BP: 130/68  101/73 117/83  Pulse: 87   88  Resp: 20  19 16   Temp: 97.6 F (36.4 C)   98.1 F (36.7 C)  TempSrc: Oral   Oral  SpO2: 100%  100% 98%  Weight:  77.6 kg    Height:        Intake/Output Summary (Last 24 hours) at 04/24/2021 1534 Last data filed at 04/23/2021 1800 Gross per 24 hour  Intake 950 ml  Output 15 ml  Net 935 ml   Filed Weights   04/23/21 0400 04/23/21 1309 04/24/21 0215  Weight: 76.8 kg 76.8 kg 77.6 kg    Examination:   General: Not in pain or dyspnea  Neurology: Awake and alert, non focal., not interactive, opens eyes to touch but not talking or answering questions.  E ENT: no pallor, no icterus, oral mucosa moist Cardiovascular: No JVD. S1-S2 present, rhythmic, no gallops, rubs, or murmurs. No lower extremity edema. Pulmonary: positive breath sounds bilaterally, adequate air movement, no wheezing, rhonchi or rales. Gastrointestinal. Abdomen soft and non tender Skin. Distal ulcerated wounds Stage III, mild local edema but not erythema,  Musculoskeletal: no joint deformities      Data Reviewed: I have personally reviewed following labs and imaging studies  CBC: Recent Labs  Lab 04/21/21 1019 04/22/21 0257  WBC 12.8* 8.2  NEUTROABS 10.1* 6.6  HGB 10.8* 10.3*  HCT 36.9 34.7*  MCV 95.6 94.0  PLT 359 706   Basic Metabolic Panel: Recent Labs  Lab 04/21/21 1019 04/22/21 0257 04/23/21 0637   NA 140 140 138  K 2.6* 3.5 4.0  CL 99 107 108  CO2 27 23 17*  GLUCOSE 165* 134* 157*  BUN 13 12 14   CREATININE 1.33* 1.33* 1.28*  CALCIUM 8.7* 8.3* 8.7*  MG 1.5*  TEST WILL BE CREDITED 2.3 2.1  PHOS  --  3.3 2.9   GFR: Estimated Creatinine Clearance: 46.3 mL/min (A) (by C-G formula based on SCr of 1.28 mg/dL (H)). Liver Function Tests: Recent Labs  Lab 04/21/21 1019 04/22/21 0257  AST 12* 10*  ALT 9 6  ALKPHOS 82 66  BILITOT 1.0 0.6  PROT 7.1 6.2*  ALBUMIN 2.3* 1.8*   No results for input(s): LIPASE, AMYLASE in the last 168 hours. No results for input(s): AMMONIA in the last 168 hours. Coagulation Profile: Recent Labs  Lab 04/21/21 1019  INR 1.7*   Cardiac Enzymes: No results for input(s): CKTOTAL, CKMB, CKMBINDEX, TROPONINI in the last 168 hours. BNP (last 3 results) No results for input(s): PROBNP in the last 8760 hours. HbA1C: No results  for input(s): HGBA1C in the last 72 hours. CBG: Recent Labs  Lab 04/23/21 1130 04/23/21 1308 04/23/21 2108 04/24/21 0510 04/24/21 1124  GLUCAP 82 95 140* 136* 96   Lipid Profile: No results for input(s): CHOL, HDL, LDLCALC, TRIG, CHOLHDL, LDLDIRECT in the last 72 hours. Thyroid Function Tests: No results for input(s): TSH, T4TOTAL, FREET4, T3FREE, THYROIDAB in the last 72 hours. Anemia Panel: No results for input(s): VITAMINB12, FOLATE, FERRITIN, TIBC, IRON, RETICCTPCT in the last 72 hours.    Radiology Studies: I have reviewed all of the imaging during this hospital visit personally     Scheduled Meds:  amLODipine  10 mg Oral Daily   DULoxetine  60 mg Oral Daily   heparin  5,000 Units Subcutaneous Q8H   insulin aspart  0-5 Units Subcutaneous QHS   insulin aspart  0-9 Units Subcutaneous TID WC   insulin aspart  3 Units Subcutaneous TID WC   levETIRAcetam  750 mg Oral BID   metoprolol tartrate  50 mg Oral BID   oxybutynin  5 mg Oral QHS   pantoprazole  40 mg Oral Daily   rosuvastatin  10 mg Oral Daily    traZODone  50 mg Oral QHS   umeclidinium bromide  1 puff Inhalation Daily   zinc sulfate  220 mg Oral Daily   Continuous Infusions:  sodium chloride Stopped (04/23/21 1702)   vancomycin       LOS: 3 days        Akiel Fennell Gerome Apley, MD

## 2021-04-24 NOTE — Care Management Important Message (Signed)
Important Message  Patient Details  Name: [name] MRN: [id number] Date of Birth: 01/31/1966   Medicare Important Message Given:  Yes     Demarlo Riojas 04/24/2021, 2:50 PM

## 2021-04-24 NOTE — Progress Notes (Signed)
   VASCULAR SURGERY ASSESSMENT & PLAN:   S/P PTA/STENT RIGHT SFA/POP: The right foot is warm.  There is no hematoma in the left groin.  The images have not yet been transferred to the server.  I will have the OR's transfer the images.  It sounds like she will ultimately need work on the left leg in the near future.  STAGE III CHRONIC KIDNEY DISEASE: Her arteriogram was done with limited contrast.  She is refusing lab draw this morning.   SUBJECTIVE:   No specific complaints.  PHYSICAL EXAM:   Vitals:   04/23/21 2011 04/23/21 2324 04/24/21 0215 04/24/21 0310  BP: 109/88 130/68  101/73  Pulse: 91 87    Resp:  20  19  Temp: (!) 97.4 F (36.3 C) 97.6 F (36.4 C)    TempSrc: Oral Oral    SpO2: 100% 100%  100%  Weight:   77.6 kg   Height:       The right foot is warm. No change in the wounds to both legs. No hematoma left groin.  LABS:   Lab Results  Component Value Date   WBC 8.2 04/22/2021   HGB 10.3 (L) 04/22/2021   HCT 34.7 (L) 04/22/2021   MCV 94.0 04/22/2021   PLT 345 04/22/2021   Lab Results  Component Value Date   CREATININE 1.28 (H) 04/23/2021   CBG (last 3)  Recent Labs    04/23/21 1308 04/23/21 2108 04/24/21 0510  GLUCAP 95 140* 136*    PROBLEM LIST:    Principal Problem:   Sepsis ([place]) Active Problems:   Essential hypertension   INSOMNIA   Medical non-compliance   Poor mobility   Lower extremity weakness   HTN (hypertension), malignant   Hypokalemia   Diabetic neuropathy (HCC)   CKD (chronic kidney disease) stage 3, GFR 30-59 ml/min (HCC)   Severe recurrent major depression without psychotic features (HCC)   COPD (chronic obstructive pulmonary disease) (HCC)   At high risk for falls   PAF (paroxysmal atrial fibrillation) (HCC)   Atrial fibrillation with RVR (HCC)   Seizures (HCC)   Sacral ulcer, limited to breakdown of skin (HCC)   Reflux esophagitis   Multiple duodenal ulcers   Chronic respiratory failure with hypoxia (HCC)    Hypomagnesemia   PVD (peripheral vascular disease) (HCC)   Atherosclerosis of native artery of both legs with gangrene (HCC)   CURRENT MEDS:    amLODipine  10 mg Oral Daily   DULoxetine  60 mg Oral Daily   heparin  5,000 Units Subcutaneous Q8H   insulin aspart  0-5 Units Subcutaneous QHS   insulin aspart  0-9 Units Subcutaneous TID WC   insulin aspart  3 Units Subcutaneous TID WC   levETIRAcetam  750 mg Oral BID   metoprolol tartrate  50 mg Oral BID   oxybutynin  5 mg Oral QHS   pantoprazole  40 mg Oral Daily   rosuvastatin  10 mg Oral Daily   traZODone  50 mg Oral QHS   umeclidinium bromide  1 puff Inhalation Daily   zinc sulfate  220 mg Oral Daily    Deitra Mayo Office: [phone] 04/24/2021

## 2021-04-25 ENCOUNTER — Inpatient Hospital Stay (HOSPITAL_COMMUNITY): Payer: Medicare Other

## 2021-04-25 ENCOUNTER — Ambulatory Visit: Payer: Medicare Other | Admitting: Student

## 2021-04-25 ENCOUNTER — Inpatient Hospital Stay: Payer: Self-pay

## 2021-04-25 DIAGNOSIS — Z9181 History of falling: Secondary | ICD-10-CM | POA: Diagnosis not present

## 2021-04-25 DIAGNOSIS — R7881 Bacteremia: Secondary | ICD-10-CM | POA: Diagnosis not present

## 2021-04-25 DIAGNOSIS — I70263 Atherosclerosis of native arteries of extremities with gangrene, bilateral legs: Secondary | ICD-10-CM | POA: Diagnosis not present

## 2021-04-25 DIAGNOSIS — A419 Sepsis, unspecified organism: Secondary | ICD-10-CM | POA: Diagnosis not present

## 2021-04-25 DIAGNOSIS — I4891 Unspecified atrial fibrillation: Secondary | ICD-10-CM | POA: Diagnosis not present

## 2021-04-25 LAB — CBC WITH DIFFERENTIAL/PLATELET
Abs Immature Granulocytes: 0.1 10*3/uL — ABNORMAL HIGH (ref 0.00–0.07)
Basophils Absolute: 0.1 10*3/uL (ref 0.0–0.1)
Basophils Relative: 1 %
Eosinophils Absolute: 0.2 10*3/uL (ref 0.0–0.5)
Eosinophils Relative: 2 %
HCT: 28.4 % — ABNORMAL LOW (ref 36.0–46.0)
Hemoglobin: 8.4 g/dL — ABNORMAL LOW (ref 12.0–15.0)
Immature Granulocytes: 1 %
Lymphocytes Relative: 16 %
Lymphs Abs: 1.6 10*3/uL (ref 0.7–4.0)
MCH: 28.2 pg (ref 26.0–34.0)
MCHC: 29.6 g/dL — ABNORMAL LOW (ref 30.0–36.0)
MCV: 95.3 fL (ref 80.0–100.0)
Monocytes Absolute: 0.9 10*3/uL (ref 0.1–1.0)
Monocytes Relative: 10 %
Neutro Abs: 6.9 10*3/uL (ref 1.7–7.7)
Neutrophils Relative %: 70 %
Platelets: 296 10*3/uL (ref 150–400)
RBC: 2.98 MIL/uL — ABNORMAL LOW (ref 3.87–5.11)
RDW: 16.4 % — ABNORMAL HIGH (ref 11.5–15.5)
WBC: 9.8 10*3/uL (ref 4.0–10.5)
nRBC: 0.5 % — ABNORMAL HIGH (ref 0.0–0.2)

## 2021-04-25 LAB — GLUCOSE, CAPILLARY
Glucose-Capillary: 106 mg/dL — ABNORMAL HIGH (ref 70–99)
Glucose-Capillary: 109 mg/dL — ABNORMAL HIGH (ref 70–99)
Glucose-Capillary: 162 mg/dL — ABNORMAL HIGH (ref 70–99)
Glucose-Capillary: 106 mg/dL — ABNORMAL HIGH (ref 70–99)
Glucose-Capillary: 132 mg/dL — ABNORMAL HIGH (ref 70–99)

## 2021-04-25 LAB — MAGNESIUM: Magnesium: 1.8 mg/dL (ref 1.7–2.4)

## 2021-04-25 LAB — BASIC METABOLIC PANEL
Anion gap: 13 (ref 5–15)
BUN: 14 mg/dL (ref 6–20)
CO2: 19 mmol/L — ABNORMAL LOW (ref 22–32)
Calcium: 8.9 mg/dL (ref 8.9–10.3)
Chloride: 112 mmol/L — ABNORMAL HIGH (ref 98–111)
Creatinine, Ser: 1.64 mg/dL — ABNORMAL HIGH (ref 0.44–1.00)
GFR, Estimated: 37 mL/min — ABNORMAL LOW (ref >60)
Glucose, Bld: 126 mg/dL — ABNORMAL HIGH (ref 70–99)
Potassium: 3.5 mmol/L (ref 3.5–5.1)
Sodium: 144 mmol/L (ref 135–145)

## 2021-04-25 LAB — ECHOCARDIOGRAM LIMITED
Area-P 1/2: 3.27 cm2
Height: 61 in
S' Lateral: 3.5 cm
Weight: 2701.96 oz

## 2021-04-25 MED ORDER — HEPARIN BOLUS VIA INFUSION
4000.0000 [IU] | Freq: Once | INTRAVENOUS | Status: DC
  Filled 2021-04-25: qty 4000

## 2021-04-25 MED ORDER — ASPIRIN EC 81 MG PO TBEC
81.0000 mg | DELAYED_RELEASE_TABLET | Freq: Every day | ORAL | Status: DC
  Administered 2021-04-25 – 2021-05-29 (×33): 81 mg via ORAL
  Filled 2021-04-25 (×35): qty 1

## 2021-04-25 MED ORDER — HEPARIN (PORCINE) 25000 UT/250ML-% IV SOLN: 1200.0000 [IU]/h | INTRAVENOUS | Status: DC

## 2021-04-25 MED ORDER — ENOXAPARIN SODIUM 80 MG/0.8ML IJ SOSY
70.0000 mg | PREFILLED_SYRINGE | Freq: Once | INTRAMUSCULAR | Status: AC
  Administered 2021-04-25: 70 mg via SUBCUTANEOUS
  Filled 2021-04-25: qty 0.8

## 2021-04-25 MED ORDER — SODIUM CHLORIDE 0.45 % IV SOLN: INTRAVENOUS | Status: DC

## 2021-04-25 MED ORDER — HEPARIN BOLUS VIA INFUSION
3000.0000 [IU] | Freq: Once | INTRAVENOUS | Status: DC
  Filled 2021-04-25: qty 3000

## 2021-04-25 NOTE — Progress Notes (Signed)
PROGRESS NOTE    [name]  WEX:[id number] DOB: June 27, 1966 DOA: 04/21/2021 PCP: Caprice Renshaw, MD    Brief Narrative:  [name] was admitted with Barbara working diagnosis of sepsis due to bilateral lower extremity leg ulcerations with cellulitis and dry gangrene    55 year old female past medical history for tobacco abuse, severe peripheral vascular disease, paroxysmal atrial fibrillation, hypertension, dyslipidemia, type 2 diabetes mellitus, diastolic heart failure, chronic kidney disease who presented with bilateral leg pain.  She was noted to have decreased p.o. intake at her nursing facility.  She does have chronic ulcerated lesions bilateral legs.  On her initial physical examination temperature 98.1, heart rate 150, respiratory rate 22, heart rate 90, blood pressure 116/92, oxygen saturation 93%, her lungs were clear to auscultation bilaterally, heart S1-S2, present, rhythmic, soft abdomen, positive pressure ulcers ventral side of distal lower extremities.  Stage IV pressure ulcers with necrotic skin.    Patient was placed on broad-spectrum antibiotic therapy with vancomycin and cefepime. Vascular surgery and orthopedic were consulted.   Patient was scheduled to have left common femoral artery access aortogram and right lower extremity angiography but patient declined to proceed on Barbara Cath Lab table. This was discussed with Barbara sister, and procedure will be reattempted under general anesthesia.    06/29 Sp right superficial femoral and popliteal artery stenting   Positive blood cultures for staph aureus #2/2      Assessment & Plan:   Principal Problem:   Sepsis ([place]) Active Problems:   Essential hypertension   INSOMNIA   Medical non-compliance   Poor mobility   Lower extremity weakness   HTN (hypertension), malignant   Hypokalemia   Diabetic neuropathy (HCC)   CKD (chronic kidney disease) stage 3, GFR 30-59 ml/min (HCC)   Severe recurrent major depression without  psychotic features (HCC)   COPD (chronic obstructive pulmonary disease) (HCC)   At high risk for falls   PAF (paroxysmal atrial fibrillation) (HCC)   Atrial fibrillation with RVR (HCC)   Seizures (HCC)   Sacral ulcer, limited to breakdown of skin (HCC)   Reflux esophagitis   Multiple duodenal ulcers   Chronic respiratory failure with hypoxia (HCC)   Hypomagnesemia   PVD (peripheral vascular disease) (HCC)   Atherosclerosis of native artery of both legs with gangrene ([place])   Sepsis due to bilateral lower extremity cellulitis with dry gangrene, in Barbara setting of severe PVD. (Present on admission)/ Staphylococcus bacteremia. Blood cultures #2/2 positive for staphylococcus. Sp right superficial femoral and popliteal artery stenting.   Patient today is more awake and reactive, feeling better, and improved lower extremity pain. Yesterday she decline blood cultures but today was OK with it.    Continue antibiotic therapy with IV vancomycin. Follow up on repeat blood cultures and echocardiogram. Patient will need revascularization on left lower extremity during this hospitalization.  Continue asa and IV heparin.    2. T2DM. Glucose has been well controlled, fasting this am is 126 mg/dl Continue with insulin sliding scale for glucose cover and monitoring.    3. Chronic hypoxemic respiratory failure. Continue with supplemental 02 per [place]   4. Paroxysmal atrial fibrillation. continue rate control with metoprolol, changed apixaban for heparin in preparation for left lower extremity revascularization.    5., HTN Dyslipidemia.  Continue with statin therapy On amlodipine and metoprolol for blood pressure control.    6. AKI on CKD stage 3b/ hypokalemia, anion gap metabolic acidosis. Renal function with serum cr at 1,64 with K at 3,5  and serum bicarbonate at  19. Cl 112. NA 144.  Patient with poor oral intake.  Plan to add gentle hydration with hypotonic saline at 0,45%, 75 ml per hr and follow  up renal function in am. Avoid hypotension and nephrotoxic medications.  Vancomycin dosing per pharmacy protocol.   7. Depression. Continue with lorazepam, duloxetine and trazodone    Patient continue to be at high risk for worsening gangrene and sepsis   Status is: Inpatient  Remains inpatient appropriate because:IV treatments appropriate due to intensity of illness or inability to take PO  Dispo: Barbara patient is from: SNF              Anticipated d/c is to: SNF              Patient currently is not medically stable to d/c.   Difficult to place patient No  DVT prophylaxis: Heparin   Code Status:   full  Family Communication:  I spoke over Barbara phone with [name] about patient's  condition, plan of care, prognosis and all questions were addressed.      Skin Documentation: Pressure Injury 12/03/20 Perineum Posterior Stage 1 -  Intact skin with non-blanchable redness of a localized area usually over a bony prominence. (Active)  12/03/20 2210  Location: Perineum  Location Orientation: Posterior  Staging: Stage 1 -  Intact skin with non-blanchable redness of a localized area usually over a bony prominence.  Wound Description (Comments):   Present on Admission: Yes     Pressure Injury 12/04/20 Coccyx Right;Medial Stage 2 -  Partial thickness loss of dermis presenting as a shallow open injury with a red, pink wound bed without slough. 1cm x 0.5cm area to Barbara right of previously healed pressure ulcer (Active)  12/04/20 0800  Location: Coccyx  Location Orientation: Right;Medial  Staging: Stage 2 -  Partial thickness loss of dermis presenting as a shallow open injury with a red, pink wound bed without slough.  Wound Description (Comments): 1cm x 0.5cm area to Barbara right of previously healed pressure ulcer  Present on Admission: Yes     Pressure Injury 03/12/21 Sacrum Mid Stage 2 -  Partial thickness loss of dermis presenting as a shallow open injury with a red, pink wound bed  without slough. (Active)  03/12/21 1128  Location: Sacrum  Location Orientation: Mid  Staging: Stage 2 -  Partial thickness loss of dermis presenting as a shallow open injury with a red, pink wound bed without slough.  Wound Description (Comments):   Present on Admission: Yes     Pressure Injury 03/12/21 Thigh Right;Upper Stage 2 -  Partial thickness loss of dermis presenting as a shallow open injury with a red, pink wound bed without slough. (Active)  03/12/21 1131  Location: Thigh  Location Orientation: Right;Upper  Staging: Stage 2 -  Partial thickness loss of dermis presenting as a shallow open injury with a red, pink wound bed without slough.  Wound Description (Comments):   Present on Admission: Yes     Pressure Injury 03/23/21 Pretibial Distal;Left Stage 3 -  Full thickness tissue loss. Subcutaneous fat may be visible but bone, tendon or muscle are NOT exposed. 7 cm X 4.5 cm no tunneling (Active)  03/23/21 1344  Location: Pretibial  Location Orientation: Distal;Left  Staging: Stage 3 -  Full thickness tissue loss. Subcutaneous fat may be visible but bone, tendon or muscle are NOT exposed.  Wound Description (Comments): 7 cm X 4.5 cm no tunneling  Present  on Admission: Yes     Pressure Injury 03/23/21 Tibial Left;Posterior;Proximal Stage 3 -  Full thickness tissue loss. Subcutaneous fat may be visible but bone, tendon or muscle are NOT exposed. 1 cm X 1 Cm with yellow sloth present (Active)  03/23/21 1357  Location: Tibial  Location Orientation: Left;Posterior;Proximal  Staging: Stage 3 -  Full thickness tissue loss. Subcutaneous fat may be visible but bone, tendon or muscle are NOT exposed.  Wound Description (Comments): 1 cm X 1 Cm with yellow sloth present  Present on Admission: Yes     Pressure Injury 03/23/21 Thigh Anterior;Left Stage 2 -  Partial thickness loss of dermis presenting as a shallow open injury with a red, pink wound bed without slough. 2 cm X 0.5 cm pink wound  bed (Active)  03/23/21 1358  Location: Thigh  Location Orientation: Anterior;Left  Staging: Stage 2 -  Partial thickness loss of dermis presenting as a shallow open injury with a red, pink wound bed without slough.  Wound Description (Comments): 2 cm X 0.5 cm pink wound bed  Present on Admission:      Pressure Injury 03/23/21 Thigh Anterior;Right Stage 2 -  Partial thickness loss of dermis presenting as a shallow open injury with a red, pink wound bed without slough. 2 cm x 1 cm pink wound bed (Active)  03/23/21 1400  Location: Thigh  Location Orientation: Anterior;Right  Staging: Stage 2 -  Partial thickness loss of dermis presenting as a shallow open injury with a red, pink wound bed without slough.  Wound Description (Comments): 2 cm x 1 cm pink wound bed  Present on Admission: Yes     Pressure Injury 03/23/21 Buttocks Right;Posterior Stage 2 -  Partial thickness loss of dermis presenting as a shallow open injury with a red, pink wound bed without slough. 0.5 cm x 0.5 cm pink wound bed (Active)  03/23/21 1402  Location: Buttocks  Location Orientation: Right;Posterior  Staging: Stage 2 -  Partial thickness loss of dermis presenting as a shallow open injury with a red, pink wound bed without slough.  Wound Description (Comments): 0.5 cm x 0.5 cm pink wound bed  Present on Admission: Yes     Pressure Injury 03/23/21 Buttocks Right Stage 2 -  Partial thickness loss of dermis presenting as a shallow open injury with a red, pink wound bed without slough. 3 cm x 1 cm pink wound bed (Active)  03/23/21 1404  Location: Buttocks  Location Orientation: Right  Staging: Stage 2 -  Partial thickness loss of dermis presenting as a shallow open injury with a red, pink wound bed without slough.  Wound Description (Comments): 3 cm x 1 cm pink wound bed  Present on Admission: Yes     Consultants:  Vascular surgery  Orthopedics   Procedures:  Right lower extremity revascularization    Antimicrobials:  IV Vancomycin     Subjective: Patient is feeling better, she is more awake and reactive today, reports improvement in her leg pain, no nausea or vomiting. Poor oral intake.   Objective: Vitals:   04/24/21 2159 04/25/21 0254 04/25/21 0305 04/25/21 1237  BP: 96/66  106/77 115/77  Pulse: 78  95 95  Resp:   16   Temp: 98.2 F (36.8 C)  98.3 F (36.8 C)   TempSrc: Oral  Oral   SpO2: 91%  100% 100%  Weight:  76.6 kg    Height:        Intake/Output Summary (Last 24 hours) at 04/25/2021  1240 Last data filed at 04/25/2021 2010 Gross per 24 hour  Intake 850 ml  Output 900 ml  Net -50 ml   Filed Weights   04/23/21 1309 04/24/21 0215 04/25/21 0254  Weight: 76.8 kg 77.6 kg 76.6 kg    Examination:   General: Not in pain or dyspnea, deconditioned  Neurology: Awake and alert, non focal  E ENT: no pallor, no icterus, oral mucosa moist Cardiovascular: No JVD. S1-S2 present, rhythmic, no gallops, rubs, or murmurs. No lower extremity edema. Pulmonary: positive breath sounds bilaterally, adequate air movement, no wheezing, rhonchi or rales. Gastrointestinal. Abdomen soft and non tender Skin. Bilateral distal legs ulcerated wounds stage 3. With necrotic tissue  Musculoskeletal: no joint deformities     Data Reviewed: I have personally reviewed following labs and imaging studies  CBC: Recent Labs  Lab 04/21/21 1019 04/22/21 0257  WBC 12.8* 8.2  NEUTROABS 10.1* 6.6  HGB 10.8* 10.3*  HCT 36.9 34.7*  MCV 95.6 94.0  PLT 359 071   Basic Metabolic Panel: Recent Labs  Lab 04/21/21 1019 04/22/21 0257 04/23/21 0637 04/25/21 0818  NA 140 140 138 144  K 2.6* 3.5 4.0 3.5  CL 99 107 108 112*  CO2 27 23 17* 19*  GLUCOSE 165* 134* 157* 126*  BUN 13 12 14 14   CREATININE 1.33* 1.33* 1.28* 1.64*  CALCIUM 8.7* 8.3* 8.7* 8.9  MG 1.5*  TEST WILL BE CREDITED 2.3 2.1 1.8  PHOS  --  3.3 2.9  --    GFR: Estimated Creatinine Clearance: 35.9 mL/min (A) (by C-G formula  based on SCr of 1.64 mg/dL (H)). Liver Function Tests: Recent Labs  Lab 04/21/21 1019 04/22/21 0257  AST 12* 10*  ALT 9 6  ALKPHOS 82 66  BILITOT 1.0 0.6  PROT 7.1 6.2*  ALBUMIN 2.3* 1.8*   No results for input(s): LIPASE, AMYLASE in Barbara last 168 hours. No results for input(s): AMMONIA in Barbara last 168 hours. Coagulation Profile: Recent Labs  Lab 04/21/21 1019  INR 1.7*   Cardiac Enzymes: No results for input(s): CKTOTAL, CKMB, CKMBINDEX, TROPONINI in Barbara last 168 hours. BNP (last 3 results) No results for input(s): PROBNP in Barbara last 8760 hours. HbA1C: No results for input(s): HGBA1C in Barbara last 72 hours. CBG: Recent Labs  Lab 04/24/21 1124 04/24/21 1614 04/24/21 2201 04/25/21 0252 04/25/21 0627  GLUCAP 96 135* 97 106* 109*   Lipid Profile: No results for input(s): CHOL, HDL, LDLCALC, TRIG, CHOLHDL, LDLDIRECT in Barbara last 72 hours. Thyroid Function Tests: No results for input(s): TSH, T4TOTAL, FREET4, T3FREE, THYROIDAB in Barbara last 72 hours. Anemia Panel: No results for input(s): VITAMINB12, FOLATE, FERRITIN, TIBC, IRON, RETICCTPCT in Barbara last 72 hours.    Radiology Studies: I have reviewed all of Barbara imaging during this hospital visit personally     Scheduled Meds:  amLODipine  10 mg Oral Daily   aspirin EC  81 mg Oral Daily   DULoxetine  60 mg Oral Daily   heparin  5,000 Units Subcutaneous Q8H   insulin aspart  0-5 Units Subcutaneous QHS   insulin aspart  0-9 Units Subcutaneous TID WC   insulin aspart  3 Units Subcutaneous TID WC   levETIRAcetam  750 mg Oral BID   metoprolol tartrate  50 mg Oral BID   oxybutynin  5 mg Oral QHS   pantoprazole  40 mg Oral Daily   rosuvastatin  10 mg Oral Daily   traZODone  50 mg Oral QHS   zinc  sulfate  220 mg Oral Daily   Continuous Infusions:  vancomycin 1,250 mg (04/24/21 1819)     LOS: 4 days        Clancy Mullarkey Gerome Apley, MD

## 2021-04-25 NOTE — Progress Notes (Signed)
Arrived to discuss PICC placement with patient and family member. Patient agreed to PICC placement, but wants to have antianxiety med and to wait until 7-2 AM to sign consent and have procedure completed. VAST to follow up in AM of 04-26-21.

## 2021-04-25 NOTE — Progress Notes (Signed)
ANTICOAGULATION CONSULT NOTE  Pharmacy Consult for heparin Indication:  PVD  No Known Allergies  Patient Measurements: Height: 5\' 1"  (154.9 cm) Weight: 76.6 kg (168 lb 14 oz) IBW/kg (Calculated) : 47.8 Heparin Dosing Weight: 76kg   Vital Signs: Temp: 98.6 F (37 C) (07/01 2030) Temp Source: Oral (07/01 2030) BP: 99/61 (07/01 2030) Pulse Rate: 80 (07/01 2030)  Labs: Recent Labs    04/23/21 0637 04/25/21 0818  CREATININE 1.28* 1.64*     Estimated Creatinine Clearance: 35.9 mL/min (A) (by C-G formula based on SCr of 1.64 mg/dL (H)).   Medical History: Past Medical History:  Diagnosis Date   Alcohol use    Ankle fracture, lateral malleolus, closed 2013   Anxiety    Aortic atherosclerosis ([place]) 03/10/2021   Breast mass, left 2013   CHF (congestive heart failure) ([place])    a. EF 20-25% by echo in 05/2016 with cath showing normal cors b. EF 50-55% in 07/2020 c. 01/2021: EF at 55-60% with moderate LVH   Chronic anemia    CKD (chronic kidney disease), stage III (HCC)    Cocaine abuse (HCC)    COPD (chronic obstructive pulmonary disease) ([place])    Diabetes mellitus, type 2 ([place])    Diabetic Charcot foot ([place])    Essential hypertension    History of cardiomyopathy    History of GI bleed 2011   Hyperlipidemia    Noncompliance    Obesity    PAF (paroxysmal atrial fibrillation) (HCC)    Panic attacks    PAT (paroxysmal atrial tachycardia) (HCC)    Previously on Amiodarone   Seizures ([place]) 10/01/2020   Sleep apnea    Not on CPAP   Stroke ([place]) 2018   Tobacco abuse    Urinary incontinence      Assessment: 46 yoF admitted with septic gangrene. Pt has hx of AFib on apixaban PTA which was held for procedures. Pharmacy asked to start IV heparin. SQ heparin given this morning ~0630.   Patient never started heparin due to lack of access. Patient refused PICC line. Discussed with MD and will give one treatment dose of enoxaparin to cover until morning when patient may be  more amenable to PICC  Goal of Therapy:  Heparin level 0.3-0.7 units/ml aPTT 66-102 seconds Monitor platelets by anticoagulation protocol: Yes   Plan:  Enoxaparin 70mg  x1 F/u PICC placement for heparin   Benetta Spar, PharmD, BCPS, [facility] Clinical Pharmacist  Please check AMION for all [place] phone numbers After 10:00 PM, call [place] 04/25/2021

## 2021-04-25 NOTE — Progress Notes (Signed)
IV/PICC line team had lengthy discussion with pt, family at bedside and on the phone. Pt refused picc line placement again, said she wants to do it tomorrow.

## 2021-04-25 NOTE — Progress Notes (Addendum)
ANTICOAGULATION CONSULT NOTE  Pharmacy Consult for heparin Indication:  PVD  No Known Allergies  Patient Measurements: Height: 5\' 1"  (154.9 cm) Weight: 76.6 kg (168 lb 14 oz) IBW/kg (Calculated) : 47.8 Heparin Dosing Weight: 76kg   Vital Signs: Temp: 98.5 F (36.9 C) (07/01 1237) Temp Source: Oral (07/01 1237) BP: 115/77 (07/01 1237) Pulse Rate: 95 (07/01 1237)  Labs: Recent Labs    04/23/21 0637 04/25/21 0818  CREATININE 1.28* 1.64*    Estimated Creatinine Clearance: 35.9 mL/min (A) (by C-G formula based on SCr of 1.64 mg/dL (H)).   Medical History: Past Medical History:  Diagnosis Date   Alcohol use    Ankle fracture, lateral malleolus, closed 2013   Anxiety    Aortic atherosclerosis ([place]) 03/10/2021   Breast mass, left 2013   CHF (congestive heart failure) ([place])    a. EF 20-25% by echo in 05/2016 with cath showing normal cors b. EF 50-55% in 07/2020 c. 01/2021: EF at 55-60% with moderate LVH   Chronic anemia    CKD (chronic kidney disease), stage III (HCC)    Cocaine abuse (HCC)    COPD (chronic obstructive pulmonary disease) ([place])    Diabetes mellitus, type 2 ([place])    Diabetic Charcot foot ([place])    Essential hypertension    History of cardiomyopathy    History of GI bleed 2011   Hyperlipidemia    Noncompliance    Obesity    PAF (paroxysmal atrial fibrillation) (HCC)    Panic attacks    PAT (paroxysmal atrial tachycardia) (HCC)    Previously on Amiodarone   Seizures ([place]) 10/01/2020   Sleep apnea    Not on CPAP   Stroke ([place]) 2018   Tobacco abuse    Urinary incontinence      Assessment: 89 yoF admitted with septic gangrene. Pt has hx of AFib on apixaban PTA which was held for procedures. Pharmacy asked to start IV heparin. SQ heparin given this morning ~0630.   Goal of Therapy:  Heparin level 0.3-0.7 units/ml aPTT 66-102 seconds Monitor platelets by anticoagulation protocol: Yes   Plan:  -Heparin 3000 units x1 then 1200 units/h -Check 8h  aPTT and heparin level - can likely dose via heparin level   Arrie Senate, PharmD, BCPS, [facility] Clinical Pharmacist [phone] Please check AMION for all [place] numbers 04/25/2021

## 2021-04-25 NOTE — Progress Notes (Addendum)
  Progress Note    04/25/2021 7:29 AM 2 Days Post-Op  Subjective:  Conversing with blanket covering her head.  Constant pain from ankle wounds.  R ankle feels a little better since procedure.   Vitals:   04/24/21 2159 04/25/21 0305  BP: 96/66 106/77  Pulse: 78 95  Resp:  16  Temp: 98.2 F (36.8 C) 98.3 F (36.8 C)  SpO2: 91% 100%   Physical Exam: Lungs:  non labored Incisions:  L groin soft without hematoma Extremities:  palpable R ATA pulse; L foot without pedal pulses Abdomen:  soft Neurologic: A&O  CBC    Component Value Date/Time   WBC 8.2 04/22/2021 0257   RBC 3.69 (L) 04/22/2021 0257   HGB 10.3 (L) 04/22/2021 0257   HCT 34.7 (L) 04/22/2021 0257   PLT 345 04/22/2021 0257   MCV 94.0 04/22/2021 0257   MCH 27.9 04/22/2021 0257   MCHC 29.7 (L) 04/22/2021 0257   RDW 15.9 (H) 04/22/2021 0257   LYMPHSABS 0.9 04/22/2021 0257   MONOABS 0.5 04/22/2021 0257   EOSABS 0.2 04/22/2021 0257   BASOSABS 0.1 04/22/2021 0257    BMET    Component Value Date/Time   NA 138 04/23/2021 0637   K 4.0 04/23/2021 0637   CL 108 04/23/2021 0637   CO2 17 (L) 04/23/2021 0637   GLUCOSE 157 (H) 04/23/2021 0637   BUN 14 04/23/2021 0637   CREATININE 1.28 (H) 04/23/2021 0637   CREATININE 1.56 (H) 07/26/2018 1513   CALCIUM 8.7 (L) 04/23/2021 0637   CALCIUM 8.9 09/13/2014 0703   GFRNONAA 49 (L) 04/23/2021 0637   GFRNONAA 37 (L) 07/26/2018 1513   GFRAA 54 (L) 05/09/2019 0956   GFRAA 43 (L) 07/26/2018 1513    INR    Component Value Date/Time   INR 1.7 (H) 04/21/2021 1019     Intake/Output Summary (Last 24 hours) at 04/25/2021 0729 Last data filed at 04/25/2021 0762 Gross per 24 hour  Intake 850 ml  Output 900 ml  Net -50 ml     Assessment/Plan:  55 y.o. female is s/p R SFA/popliteal stenting 2 Days Post-Op   RLE well perfused with palpable R ATA pulse Continue current wound care to both legs >70% stenosis of L SFA on angiogram; she will need LLE intervention during current  hospitalization Continue IV heparin; hold Eliquis; start 81mg  asa   Dagoberto Ligas, PA-C [facility] [phone] 04/25/2021 7:29 AM

## 2021-04-25 NOTE — Progress Notes (Signed)
Pharmacy Antibiotic Note  [name] is a 55 y.o. female admitted on 04/21/2021 with sepsis/cellulitis.  Pharmacy has been consulted for Vancomycin  dosing.Blood cultures noted with GPC 2/2 and BCID with staph species (sensitivities pending) -SCr= 1.28  Plan: Continue Vancomycin 1250mg  IV q24h for AUC of 537 F/U cxs and clinical progress Monitor V/S, labs and levels as indicated  Temp (24hrs), Avg:98.2 F (36.8 C), Min:98.1 F (36.7 C), Max:98.3 F (36.8 C)  Recent Labs  Lab 04/21/21 1019 04/22/21 0257 04/23/21 0637  WBC 12.8* 8.2  --   CREATININE 1.33* 1.33* 1.28*  LATICACIDVEN 1.2  --   --      Estimated Creatinine Clearance: 45.9 mL/min (A) (by C-G formula based on SCr of 1.28 mg/dL (H)).    No Known Allergies  Antimicrobials this admission: Vancomycin 6/27 >>  Cefepime 6/27>> 6/30  Microbiology results: 6/27 BCx: pending  MRSA PCR:   Thank you for allowing pharmacy to be a part of this patient's care.  Hildred Laser, PharmD Clinical Pharmacist **Pharmacist phone directory can now be found on [email or web address] ([email or web address]).  Listed under [place].

## 2021-04-26 DIAGNOSIS — Z9181 History of falling: Secondary | ICD-10-CM | POA: Diagnosis not present

## 2021-04-26 DIAGNOSIS — I4891 Unspecified atrial fibrillation: Secondary | ICD-10-CM | POA: Diagnosis not present

## 2021-04-26 DIAGNOSIS — I70263 Atherosclerosis of native arteries of extremities with gangrene, bilateral legs: Secondary | ICD-10-CM | POA: Diagnosis not present

## 2021-04-26 DIAGNOSIS — A419 Sepsis, unspecified organism: Secondary | ICD-10-CM | POA: Diagnosis not present

## 2021-04-26 LAB — BASIC METABOLIC PANEL
Anion gap: 7 (ref 5–15)
BUN: 14 mg/dL (ref 6–20)
CO2: 21 mmol/L — ABNORMAL LOW (ref 22–32)
Calcium: 8.7 mg/dL — ABNORMAL LOW (ref 8.9–10.3)
Chloride: 114 mmol/L — ABNORMAL HIGH (ref 98–111)
Creatinine, Ser: 1.65 mg/dL — ABNORMAL HIGH (ref 0.44–1.00)
GFR, Estimated: 36 mL/min — ABNORMAL LOW (ref >60)
Glucose, Bld: 171 mg/dL — ABNORMAL HIGH (ref 70–99)
Potassium: 3.3 mmol/L — ABNORMAL LOW (ref 3.5–5.1)
Sodium: 142 mmol/L (ref 135–145)

## 2021-04-26 LAB — CULTURE, BLOOD (ROUTINE X 2)
Special Requests: ADEQUATE
Special Requests: ADEQUATE

## 2021-04-26 LAB — GLUCOSE, CAPILLARY
Glucose-Capillary: 209 mg/dL — ABNORMAL HIGH (ref 70–99)
Glucose-Capillary: 113 mg/dL — ABNORMAL HIGH (ref 70–99)
Glucose-Capillary: 142 mg/dL — ABNORMAL HIGH (ref 70–99)

## 2021-04-26 MED ORDER — ENOXAPARIN SODIUM 80 MG/0.8ML IJ SOSY
75.0000 mg | PREFILLED_SYRINGE | Freq: Two times a day (BID) | INTRAMUSCULAR | Status: DC
  Administered 2021-04-26 – 2021-04-29 (×5): 75 mg via SUBCUTANEOUS
  Filled 2021-04-26 (×6): qty 0.8

## 2021-04-26 MED ORDER — VANCOMYCIN HCL 1000 MG/200ML IV SOLN
1000.0000 mg | INTRAVENOUS | Status: DC
  Filled 2021-04-26: qty 200

## 2021-04-26 NOTE — Progress Notes (Signed)
[place] NOTE  Pharmacy Consult for Enoxaparin Indication:  PVD  No Known Allergies  Patient Measurements: Height: 5\' 1"  (154.9 cm) Weight: 76.8 kg (169 lb 5 oz) IBW/kg (Calculated) : 47.8 Heparin Dosing Weight: 76kg   Vital Signs: Temp: 98.2 F (36.8 C) (07/02 0527) Temp Source: Oral (07/02 0527) BP: 89/59 (07/02 0527) Pulse Rate: 77 (07/02 0527)  Labs: Recent Labs    04/25/21 0818 04/25/21 2121  HGB  --  8.4*  HCT  --  28.4*  PLT  --  296  CREATININE 1.64*  --      Estimated Creatinine Clearance: 35.9 mL/min (A) (by C-G formula based on SCr of 1.64 mg/dL (H)).   Medical History: Past Medical History:  Diagnosis Date   Alcohol use    Ankle fracture, lateral malleolus, closed 2013   Anxiety    Aortic atherosclerosis ([place]) 03/10/2021   Breast mass, left 2013   CHF (congestive heart failure) ([place])    a. EF 20-25% by echo in 05/2016 with cath showing normal cors b. EF 50-55% in 07/2020 c. 01/2021: EF at 55-60% with moderate LVH   Chronic anemia    CKD (chronic kidney disease), stage III (HCC)    Cocaine abuse (HCC)    COPD (chronic obstructive pulmonary disease) ([place])    Diabetes mellitus, type 2 ([place])    Diabetic Charcot foot ([place])    Essential hypertension    History of cardiomyopathy    History of GI bleed 2011   Hyperlipidemia    Noncompliance    Obesity    PAF (paroxysmal atrial fibrillation) (HCC)    Panic attacks    PAT (paroxysmal atrial tachycardia) (HCC)    Previously on Amiodarone   Seizures ([place]) 10/01/2020   Sleep apnea    Not on CPAP   Stroke ([place]) 2018   Tobacco abuse    Urinary incontinence      Assessment: 55 yoF admitted with septic gangrene. Pt has hx of AFib on apixaban PTA which was held for procedures. Pharmacy asked to start IV heparin last night. Patient refused PICC placement yesterday. Enoxaparin one time dose given at ~2330.  Plan for PICC placement this morning but patient refused again.  Discussed with Dr. Cathlean Sauer and plan was for recheck BMP stat with Scr increase and UOP decrease and to give enoxaparin 1mg /kg x 1 if CrCl >30 ml/min and hopefully place PICC today and start heparin tonight. Unfortunately patient refused BMP even though educated on importance of getting with Scr rise, UOP decrease and on vancomycin. Discussed with Dr. Cathlean Sauer and will start enoxaparin for now. Will dose1 mg/kg q12h based on BMP from yesterday and watch renal function closely and obtain BMP when able.   Patient has also been on vancomycin 1250mg  q24h. Blood cultures with 2/4 staph capitis (sens pending) and repeat cultures ngtd. Since refusing labs will not order vancomycin levels and will dose vancomycin based on yesterday's Scr though not ideal.   Goal of Therapy:  Heparin level 0.3-0.7 units/ml aPTT 66-102 seconds Anti-Xa level 0.6-1 units/ml 4hrs after LMWH dose given Monitor platelets by anticoagulation protocol: Yes   Plan:  Start enoxaparin 1mg /kg q12h with next dose at 1700 to get on more reasonable schedule (1700 and 0500) Will monitor for refused doses  Watch renal function closely through UOP and obtain BMP as soon as possible  Follow up plans for PICC placement and IV heparin when patient amendable   Adjust vancomycin to 1000mg  q48h based on yesterday's scr (  eAUC 459 using Scr 1.64 and Vd 0.5 for BMI 31) Monitor renal function, cultures, and clinical progression   Cristela Felt, PharmD Clinical Pharmacist  04/26/2021

## 2021-04-26 NOTE — Progress Notes (Signed)
PROGRESS NOTE    KATALENA MALVEAUX  YKZ:[id number] DOB: [birth year] DOA: 04/21/2021 PCP: Caprice Renshaw, MD    Brief Narrative:  Mrs. Savitt was admitted with the working diagnosis of sepsis due to bilateral lower extremity leg ulcerations with cellulitis and dry gangrene    55 year old female past medical history for tobacco abuse, severe peripheral vascular disease, paroxysmal atrial fibrillation, hypertension, dyslipidemia, type 2 diabetes mellitus, diastolic heart failure, chronic kidney disease who presented with bilateral leg pain.  She was noted to have decreased p.o. intake at her nursing facility.  She does have chronic ulcerated lesions bilateral legs.  On her initial physical examination temperature 98.1, heart rate 150, respiratory rate 22, heart rate 90, blood pressure 116/92, oxygen saturation 93%, her lungs were clear to auscultation bilaterally, heart S1-S2, present, rhythmic, soft abdomen, positive pressure ulcers ventral side of distal lower extremities.  Stage IV pressure ulcers with necrotic skin.    Patient was placed on broad-spectrum antibiotic therapy with vancomycin and cefepime. Vascular surgery and orthopedic were consulted.   Patient was scheduled to have left common femoral artery access aortogram and right lower extremity angiography but patient declined to proceed on the Cath Lab table. This was discussed with the sister, and procedure will be reattempted under general anesthesia.    06/29 Sp right superficial femoral and popliteal artery stenting   Positive blood cultures for staph aureus #2/2    Patient with poor IV acces, has declined PICC line several times, but this am [place] with IV line.     Assessment & Plan:   Principal Problem:   Sepsis ([place]) Active Problems:   Essential hypertension   INSOMNIA   Medical non-compliance   Poor mobility   Lower extremity weakness   HTN (hypertension), malignant   Hypokalemia   Diabetic neuropathy (HCC)   CKD  (chronic kidney disease) stage 3, GFR 30-59 ml/min (HCC)   Severe recurrent major depression without psychotic features (HCC)   COPD (chronic obstructive pulmonary disease) (HCC)   At high risk for falls   PAF (paroxysmal atrial fibrillation) (HCC)   Atrial fibrillation with RVR (HCC)   Seizures (HCC)   Sacral ulcer, limited to breakdown of skin (HCC)   Reflux esophagitis   Multiple duodenal ulcers   Chronic respiratory failure with hypoxia (HCC)   Hypomagnesemia   PVD (peripheral vascular disease) (HCC)   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 (Present on admission)/ Staphylococcus bacteremia. Blood cultures #2/2 positive for staphylococcus cutis Sp right superficial femoral and popliteal artery stenting.   Follow blood cultures with no growth. Staphylococcus cutis sensitive to vancomycin resistant to cipro and erythromycin.    On IV vancomycin with good toleration. Repeat echocardiogram with no evident vegetation.  Follow with vascular surgery for left lower extremity revascularization during this hospitalization.    2. T2DM. patient with poor oral intake, she had declined blood work this am. Capillary glucose is 132 and 209 this am.  Continue insulin sliding scale for glucose cover and monitoring.    3. Chronic hypoxemic respiratory failure. Continue with supplemental 02 per  Oxygenation has remained stable.    4. Paroxysmal atrial fibrillation. On metoprolol for rate control, anticoagulation with apixaban for now, in preparation for possible revascularization on left lower extremity.    5., HTN Dyslipidemia.  Blood pressure control with amlodipine and metoprolol for blood pressure control.  Continue  with statin therapy.    6. AKI on CKD stage 3b/ hypokalemia, anion gap metabolic acidosis.  Patient declined blood work this am, will continue  follow up renal function today, avoid nephrotoxic medications or hypotension.  Gently hydration.   7. Depression. On  lorazepam, duloxetine and trazodone   keppra 750 mg bid.    Patient continue to be at high risk for worsening gangrene   Status is: Inpatient  Remains inpatient appropriate because:Inpatient level of care appropriate due to severity of illness  Dispo: The patient is from: Home              Anticipated d/c is to: Home              Patient currently is not medically stable to d/c.   Difficult to place patient No   DVT prophylaxis: Enoxaparin   Code Status:   full  Family Communication:  No family at the bedside, I spoke with her son and sister yesterday and all questions were addressed.      Nutrition Status:           Skin Documentation: Pressure Injury 12/03/20 Perineum Posterior Stage 1 -  Intact skin with non-blanchable redness of a localized area usually over a bony prominence. (Active)  12/03/20 2210  Location: Perineum  Location Orientation: Posterior  Staging: Stage 1 -  Intact skin with non-blanchable redness of a localized area usually over a bony prominence.  Wound Description (Comments):   Present on Admission: Yes     Pressure Injury 12/04/20 Coccyx Right;Medial Stage 2 -  Partial thickness loss of dermis presenting as a shallow open injury with a red, pink wound bed without slough. 1cm x 0.5cm area to the right of previously healed pressure ulcer (Active)  12/04/20 0800  Location: Coccyx  Location Orientation: Right;Medial  Staging: Stage 2 -  Partial thickness loss of dermis presenting as a shallow open injury with a red, pink wound bed without slough.  Wound Description (Comments): 1cm x 0.5cm area to the right of previously healed pressure ulcer  Present on Admission: Yes     Pressure Injury 03/12/21 Sacrum Mid Stage 2 -  Partial thickness loss of dermis presenting as a shallow open injury with a red, pink wound bed without slough.  (Active)  03/12/21 1128  Location: Sacrum  Location Orientation: Mid  Staging: Stage 2 -  Partial thickness loss of dermis presenting as a shallow open injury with a red, pink wound bed without slough.  Wound Description (Comments):   Present on Admission: Yes     Pressure Injury 03/12/21 Thigh Right;Upper Stage 2 -  Partial thickness loss of dermis presenting as a shallow open injury with a red, pink wound bed without slough. (Active)  03/12/21 1131  Location: Thigh  Location Orientation: Right;Upper  Staging: Stage 2 -  Partial thickness loss of dermis presenting as a shallow open injury with a red, pink wound bed without slough.  Wound Description (Comments):   Present on Admission: Yes     Pressure Injury 03/23/21 Pretibial Distal;Left Stage 3 -  Full thickness tissue loss. Subcutaneous fat may be visible but bone, tendon or muscle are NOT exposed. 7 cm X 4.5 cm no tunneling (Active)  03/23/21 1344  Location: Pretibial  Location Orientation: Distal;Left  Staging: Stage 3 -  Full thickness tissue loss. Subcutaneous fat may be visible but bone, tendon or muscle are NOT exposed.  Wound Description (Comments): 7 cm X 4.5 cm no tunneling  Present  on Admission: Yes     Pressure Injury 03/23/21 Tibial Left;Posterior;Proximal Stage 3 -  Full thickness tissue loss. Subcutaneous fat may be visible but bone, tendon or muscle are NOT exposed. 1 cm X 1 Cm with yellow sloth present (Active)  03/23/21 1357  Location: Tibial  Location Orientation: Left;Posterior;Proximal  Staging: Stage 3 -  Full thickness tissue loss. Subcutaneous fat may be visible but bone, tendon or muscle are NOT exposed.  Wound Description (Comments): 1 cm X 1 Cm with yellow sloth present  Present on Admission: Yes     Pressure Injury 03/23/21 Thigh Anterior;Left Stage 2 -  Partial thickness loss of dermis presenting as a shallow open injury with a red, pink wound bed without slough. 2 cm X 0.5 cm pink wound bed (Active)   03/23/21 1358  Location: Thigh  Location Orientation: Anterior;Left  Staging: Stage 2 -  Partial thickness loss of dermis presenting as a shallow open injury with a red, pink wound bed without slough.  Wound Description (Comments): 2 cm X 0.5 cm pink wound bed  Present on Admission:      Pressure Injury 03/23/21 Thigh Anterior;Right Stage 2 -  Partial thickness loss of dermis presenting as a shallow open injury with a red, pink wound bed without slough. 2 cm x 1 cm pink wound bed (Active)  03/23/21 1400  Location: Thigh  Location Orientation: Anterior;Right  Staging: Stage 2 -  Partial thickness loss of dermis presenting as a shallow open injury with a red, pink wound bed without slough.  Wound Description (Comments): 2 cm x 1 cm pink wound bed  Present on Admission: Yes     Pressure Injury 03/23/21 Buttocks Right;Posterior Stage 2 -  Partial thickness loss of dermis presenting as a shallow open injury with a red, pink wound bed without slough. 0.5 cm x 0.5 cm pink wound bed (Active)  03/23/21 1402  Location: Buttocks  Location Orientation: Right;Posterior  Staging: Stage 2 -  Partial thickness loss of dermis presenting as a shallow open injury with a red, pink wound bed without slough.  Wound Description (Comments): 0.5 cm x 0.5 cm pink wound bed  Present on Admission: Yes     Pressure Injury 03/23/21 Buttocks Right Stage 2 -  Partial thickness loss of dermis presenting as a shallow open injury with a red, pink wound bed without slough. 3 cm x 1 cm pink wound bed (Active)  03/23/21 1404  Location: Buttocks  Location Orientation: Right  Staging: Stage 2 -  Partial thickness loss of dermis presenting as a shallow open injury with a red, pink wound bed without slough.  Wound Description (Comments): 3 cm x 1 cm pink wound bed  Present on Admission: Yes     Consultants:  Vascular surgery   Procedures:  Right lower extremity revascularization   Antimicrobials:  Vancomycin IV      Subjective: Patient with no chest pain or dyspnea, intermittent leg pain, had declined PICC line several times as well as blood work.   Objective: Vitals:   04/25/21 1700 04/25/21 2030 04/26/21 0527 04/26/21 1156  BP: 93/71 99/61 (!) 89/59 91/66  Pulse: 86 80 77 90  Resp: 18 18 19 18   Temp:  98.6 F (37 C) 98.2 F (36.8 C) 98.7 F (37.1 C)  TempSrc:  Oral Oral Oral  SpO2: 100% 91% 96%   Weight:   76.8 kg   Height:        Intake/Output Summary (Last 24 hours) at 04/26/2021 1256  Last data filed at 04/26/2021 1203 Gross per 24 hour  Intake 240 ml  Output 550 ml  Net -310 ml   Filed Weights   04/24/21 0215 04/25/21 0254 04/26/21 0527  Weight: 77.6 kg 76.6 kg 76.8 kg    Examination:   General: Not in pain or dyspnea, deconditioned  Neurology: Awake and alert, non focal  E ENT: no pallor, no icterus, oral mucosa moist Cardiovascular: No JVD. S1-S2 present, rhythmic, no gallops, rubs, or murmurs. No lower extremity edema. Pulmonary: positive breath sounds bilaterally, adequate air movement, no wheezing, rhonchi or rales. Gastrointestinal. Abdomen soft and on tender Skin. Bilateral leg necrotic lesions, stage III ulcerated wounds, no purulence.  Musculoskeletal: no joint deformities     Data Reviewed: I have personally reviewed following labs and imaging studies  CBC: Recent Labs  Lab 04/21/21 1019 04/22/21 0257 04/25/21 2121  WBC 12.8* 8.2 9.8  NEUTROABS 10.1* 6.6 6.9  HGB 10.8* 10.3* 8.4*  HCT 36.9 34.7* 28.4*  MCV 95.6 94.0 95.3  PLT 359 345 097   Basic Metabolic Panel: Recent Labs  Lab 04/21/21 1019 04/22/21 0257 04/23/21 0637 04/25/21 0818  NA 140 140 138 144  K 2.6* 3.5 4.0 3.5  CL 99 107 108 112*  CO2 27 23 17* 19*  GLUCOSE 165* 134* 157* 126*  BUN 13 12 14 14   CREATININE 1.33* 1.33* 1.28* 1.64*  CALCIUM 8.7* 8.3* 8.7* 8.9  MG 1.5*  TEST WILL BE CREDITED 2.3 2.1 1.8  PHOS  --  3.3 2.9  --    GFR: Estimated Creatinine Clearance: 35.9  mL/min (A) (by C-G formula based on SCr of 1.64 mg/dL (H)). Liver Function Tests: Recent Labs  Lab 04/21/21 1019 04/22/21 0257  AST 12* 10*  ALT 9 6  ALKPHOS 82 66  BILITOT 1.0 0.6  PROT 7.1 6.2*  ALBUMIN 2.3* 1.8*   No results for input(s): LIPASE, AMYLASE in the last 168 hours. No results for input(s): AMMONIA in the last 168 hours. Coagulation Profile: Recent Labs  Lab 04/21/21 1019  INR 1.7*   Cardiac Enzymes: No results for input(s): CKTOTAL, CKMB, CKMBINDEX, TROPONINI in the last 168 hours. BNP (last 3 results) No results for input(s): PROBNP in the last 8760 hours. HbA1C: No results for input(s): HGBA1C in the last 72 hours. CBG: Recent Labs  Lab 04/25/21 0627 04/25/21 1240 04/25/21 1651 04/25/21 2123 04/26/21 1155  GLUCAP 109* 162* 106* 132* 209*   Lipid Profile: No results for input(s): CHOL, HDL, LDLCALC, TRIG, CHOLHDL, LDLDIRECT in the last 72 hours. Thyroid Function Tests: No results for input(s): TSH, T4TOTAL, FREET4, T3FREE, THYROIDAB in the last 72 hours. Anemia Panel: No results for input(s): VITAMINB12, FOLATE, FERRITIN, TIBC, IRON, RETICCTPCT in the last 72 hours.    Radiology Studies: I have reviewed all of the imaging during this hospital visit personally     Scheduled Meds:  amLODipine  10 mg Oral Daily   aspirin EC  81 mg Oral Daily   DULoxetine  60 mg Oral Daily   enoxaparin (LOVENOX) injection  75 mg Subcutaneous Q12H   insulin aspart  0-5 Units Subcutaneous QHS   insulin aspart  0-9 Units Subcutaneous TID WC   insulin aspart  3 Units Subcutaneous TID WC   levETIRAcetam  750 mg Oral BID   metoprolol tartrate  50 mg Oral BID   oxybutynin  5 mg Oral QHS   pantoprazole  40 mg Oral Daily   rosuvastatin  10 mg Oral Daily  traZODone  50 mg Oral QHS   zinc sulfate  220 mg Oral Daily   Continuous Infusions:  sodium chloride Stopped (04/25/21 1900)   [START ON 04/27/2021] vancomycin       LOS: 5 days        Zakyah Yanes Gerome Apley, MD

## 2021-04-26 NOTE — Progress Notes (Signed)
Pt. Refusing to have labs drawn.

## 2021-04-26 NOTE — Progress Notes (Signed)
Pt. Refusing CBG check.On call for [facility] paged to make aware of pts. Refusal of care.

## 2021-04-26 NOTE — Plan of Care (Signed)
  Problem: Clinical Measurements: Goal: Respiratory complications will improve Outcome: Progressing   Problem: Safety: Goal: Ability to remain free from injury will improve Outcome: Progressing   

## 2021-04-26 NOTE — Progress Notes (Signed)
Secure chat with  RN re PICC placement this am.  RN to notify when pt agreeable to PICC placement.

## 2021-04-27 DIAGNOSIS — Z9181 History of falling: Secondary | ICD-10-CM | POA: Diagnosis not present

## 2021-04-27 DIAGNOSIS — I4891 Unspecified atrial fibrillation: Secondary | ICD-10-CM | POA: Diagnosis not present

## 2021-04-27 DIAGNOSIS — A419 Sepsis, unspecified organism: Secondary | ICD-10-CM | POA: Diagnosis not present

## 2021-04-27 DIAGNOSIS — I70263 Atherosclerosis of native arteries of extremities with gangrene, bilateral legs: Secondary | ICD-10-CM | POA: Diagnosis not present

## 2021-04-27 LAB — CBC WITH DIFFERENTIAL/PLATELET
Abs Immature Granulocytes: 0.11 10*3/uL — ABNORMAL HIGH (ref 0.00–0.07)
Basophils Absolute: 0.1 10*3/uL (ref 0.0–0.1)
Basophils Relative: 1 %
Eosinophils Absolute: 0.3 10*3/uL (ref 0.0–0.5)
Eosinophils Relative: 3 %
HCT: 25.7 % — ABNORMAL LOW (ref 36.0–46.0)
Hemoglobin: 7.6 g/dL — ABNORMAL LOW (ref 12.0–15.0)
Immature Granulocytes: 1 %
Lymphocytes Relative: 19 %
Lymphs Abs: 1.8 10*3/uL (ref 0.7–4.0)
MCH: 28.3 pg (ref 26.0–34.0)
MCHC: 29.6 g/dL — ABNORMAL LOW (ref 30.0–36.0)
MCV: 95.5 fL (ref 80.0–100.0)
Monocytes Absolute: 0.9 10*3/uL (ref 0.1–1.0)
Monocytes Relative: 9 %
Neutro Abs: 6.4 10*3/uL (ref 1.7–7.7)
Neutrophils Relative %: 67 %
Platelets: 297 10*3/uL (ref 150–400)
RBC: 2.69 MIL/uL — ABNORMAL LOW (ref 3.87–5.11)
RDW: 16.8 % — ABNORMAL HIGH (ref 11.5–15.5)
WBC: 9.5 10*3/uL (ref 4.0–10.5)
nRBC: 0.5 % — ABNORMAL HIGH (ref 0.0–0.2)

## 2021-04-27 LAB — BASIC METABOLIC PANEL
Anion gap: 8 (ref 5–15)
BUN: 13 mg/dL (ref 6–20)
CO2: 22 mmol/L (ref 22–32)
Calcium: 8.8 mg/dL — ABNORMAL LOW (ref 8.9–10.3)
Chloride: 113 mmol/L — ABNORMAL HIGH (ref 98–111)
Creatinine, Ser: 1.69 mg/dL — ABNORMAL HIGH (ref 0.44–1.00)
GFR, Estimated: 35 mL/min — ABNORMAL LOW (ref >60)
Glucose, Bld: 129 mg/dL — ABNORMAL HIGH (ref 70–99)
Potassium: 3.1 mmol/L — ABNORMAL LOW (ref 3.5–5.1)
Sodium: 143 mmol/L (ref 135–145)

## 2021-04-27 LAB — GLUCOSE, CAPILLARY
Glucose-Capillary: 138 mg/dL — ABNORMAL HIGH (ref 70–99)
Glucose-Capillary: 129 mg/dL — ABNORMAL HIGH (ref 70–99)

## 2021-04-27 MED ORDER — POTASSIUM CHLORIDE CRYS ER 20 MEQ PO TBCR
40.0000 meq | EXTENDED_RELEASE_TABLET | Freq: Once | ORAL | Status: AC
  Administered 2021-04-27: 40 meq via ORAL
  Filled 2021-04-27: qty 2

## 2021-04-27 MED ORDER — DOXYCYCLINE HYCLATE 100 MG PO TABS
100.0000 mg | ORAL_TABLET | Freq: Two times a day (BID) | ORAL | Status: DC
  Administered 2021-04-27 – 2021-04-28 (×4): 100 mg via ORAL
  Filled 2021-04-27 (×5): qty 1

## 2021-04-27 MED ORDER — VANCOMYCIN HCL IN DEXTROSE 1-5 GM/200ML-% IV SOLN
1000.0000 mg | INTRAVENOUS | Status: DC
  Filled 2021-04-27: qty 200

## 2021-04-27 NOTE — Plan of Care (Signed)
°  Problem: Coping: °Goal: Level of anxiety will decrease °Outcome: Progressing °  °

## 2021-04-27 NOTE — Progress Notes (Addendum)
Pt. Refused CBG check. On call for [facility] paged to make aware.

## 2021-04-27 NOTE — Progress Notes (Addendum)
At bedside to place PICC.Pt disinterested in conversation, dozed off during conversation, required frequent attempts to re- engage in the conversation.  Requested from pt to call family re consent for procedure.  Pt frequently raising her voice with agitation. Instead of signing consent, pt bundled arms up in the bed covers.  Asked pt directly if it was ok to place PICC and would she give consent.  She stated it had to be done in that room, notified it would be.  She then stated she would sign it when she was ready.  Asked when would that be, pt began yelling to leave her alone and "stop worrying her".  Zahra RN and Dr Cathlean Sauer on unit, both notified.  Dr Cathlean Sauer also aware PICC would need to be reordered if applicable, but preferably refer to IR for placement due to so many refusals for procedure for this team.

## 2021-04-27 NOTE — Progress Notes (Signed)
Patient took all morning medication today and oral antibiotic. This evening patient refused vital signs to be check, CBG and Lovenox.

## 2021-04-27 NOTE — Progress Notes (Signed)
Patient declines new IV or PICC line. Will change to oral antibiotic that she is accepting to take. I called her son several times during the day but not able to reach him.

## 2021-04-27 NOTE — Progress Notes (Signed)
PROGRESS NOTE    JULIENNE VOGLER  JJH:[id number] DOB: 05/11/1966 DOA: 04/21/2021 PCP: Caprice Renshaw, MD    Brief Narrative:  Mrs. Burkle was admitted with the working diagnosis of sepsis due to bilateral lower extremity leg ulcerations with cellulitis and dry gangrene    55 year old female past medical history for tobacco abuse, severe peripheral vascular disease, paroxysmal atrial fibrillation, hypertension, dyslipidemia, type 2 diabetes mellitus, diastolic heart failure, chronic kidney disease who presented with bilateral leg pain.  She was noted to have decreased p.o. intake at her nursing facility.  She does have chronic ulcerated lesions bilateral legs.  On her initial physical examination temperature 98.1, heart rate 150, respiratory rate 22, heart rate 90, blood pressure 116/92, oxygen saturation 93%, her lungs were clear to auscultation bilaterally, heart S1-S2, present, rhythmic, soft abdomen, positive pressure ulcers ventral side of distal lower extremities.  Stage IV pressure ulcers with necrotic skin.    Patient was placed on broad-spectrum antibiotic therapy with vancomycin and cefepime. Vascular surgery and orthopedic were consulted.   Patient was scheduled to have left common femoral artery access aortogram and right lower extremity angiography but patient declined to proceed on the Cath Lab table. This was discussed with the sister, and procedure will be reattempted under general anesthesia.    06/29 Sp right superficial femoral and popliteal artery stenting   Positive blood cultures for staph aureus #2/2    Patient with poor IV acces, has declined PICC line several times.   Assessment & Plan:   Principal Problem:   Sepsis ([place]) Active Problems:   Essential hypertension   INSOMNIA   Medical non-compliance   Poor mobility   Lower extremity weakness   HTN (hypertension), malignant   Hypokalemia   Diabetic neuropathy (HCC)   CKD (chronic kidney disease) stage 3, GFR  30-59 ml/min (HCC)   Severe recurrent major depression without psychotic features (HCC)   COPD (chronic obstructive pulmonary disease) (HCC)   At high risk for falls   PAF (paroxysmal atrial fibrillation) (HCC)   Atrial fibrillation with RVR (HCC)   Seizures (HCC)   Sacral ulcer, limited to breakdown of skin (HCC)   Reflux esophagitis   Multiple duodenal ulcers   Chronic respiratory failure with hypoxia (HCC)   Hypomagnesemia   PVD (peripheral vascular disease) (HCC)   Atherosclerosis of native artery of both legs with gangrene ([place])   Sepsis due to bilateral lower extremity cellulitis with dry gangrene, in the setting of severe PVD. (Present on admission)/ Staphylococcus bacteremia. Blood cultures #2/2 positive for staphylococcus cutis Sp right superficial femoral and popliteal artery stenting.   Follow blood cultures continue with no growth. Staphylococcus cutis sensitive to vancomycin resistant to cipro and erythromycin. Wbc today is 9,5   For now will continue with IV vancomycin (#7) after final orthopedic recommendations will plan for duration of antibiotic therapy. Echocardiogram with no evident vegetation. Poor IV access, continue anticoagulation with enoxaparin.   Pending revascularization of left lower extremity.    2. T2DM. continue to have poor oral intake,  Fasting glucose is 129 mg.dl today.  Continue insulin sliding scale for glucose cover and monitoring.    3. Chronic hypoxemic respiratory failure. Continue with supplemental 02 per [place] Oxygen saturation is 98 to 100% on 2 L/min per [place].    4. Paroxysmal atrial fibrillation. Continue rate control with metoprolol and anticoagulation with enoxaparin (in preparation for lower extremity revascularization).    5., HTN Dyslipidemia.  On amlodipine and metoprolol for blood pressure control.  On statin therapy.    6. AKI on CKD stage 3b/ hypokalemia, anion gap metabolic acidosis.  Renal function with serum cr at 1.69  with K at 3,1 and serum bicarbonate at 22, cl 113.  Add 40 meq kcl po x1 and follow up renal function in am. Poor IV access, continue with half normal saline infusion.    7. Depression.  Continue with lorazepam, duloxetine and trazodone  On keppra 750 mg bid.  Patient has changing mood, has been declining interventions like PICC line and blood drawn.   8. Chronic anemia, chronic disease. Hgb is 7,6 with hct at 25,7 and Plt 297.  Continue close follow up on cell count, no signs of bleeding.   Patient continue to be at high risk for worsening sepsis and gangrene   Status is: Inpatient  Remains inpatient appropriate because:IV treatments appropriate due to intensity of illness or inability to take PO  Dispo: The patient is from: Home              Anticipated d/c is to: Home              Patient currently is not medically stable to d/c.   Difficult to place patient No   DVT prophylaxis: Enoxaparin   Code Status:   full  Family Communication:   Not able to reach her son over the phone at the time of my examination.    Skin Documentation: Pressure Injury 12/03/20 Perineum Posterior Stage 1 -  Intact skin with non-blanchable redness of a localized area usually over a bony prominence. (Active)  12/03/20 2210  Location: Perineum  Location Orientation: Posterior  Staging: Stage 1 -  Intact skin with non-blanchable redness of a localized area usually over a bony prominence.  Wound Description (Comments):   Present on Admission: Yes     Pressure Injury 12/04/20 Coccyx Right;Medial Stage 2 -  Partial thickness loss of dermis presenting as a shallow open injury with a red, pink wound bed without slough. 1cm x 0.5cm area to the right of previously healed pressure ulcer (Active)  12/04/20 0800  Location: Coccyx  Location Orientation: Right;Medial  Staging: Stage 2 -  Partial thickness loss of dermis presenting as a shallow open injury with a red, pink wound bed without slough.  Wound  Description (Comments): 1cm x 0.5cm area to the right of previously healed pressure ulcer  Present on Admission: Yes     Pressure Injury 03/12/21 Sacrum Mid Stage 2 -  Partial thickness loss of dermis presenting as a shallow open injury with a red, pink wound bed without slough. (Active)  03/12/21 1128  Location: Sacrum  Location Orientation: Mid  Staging: Stage 2 -  Partial thickness loss of dermis presenting as a shallow open injury with a red, pink wound bed without slough.  Wound Description (Comments):   Present on Admission: Yes     Pressure Injury 03/12/21 Thigh Right;Upper Stage 2 -  Partial thickness loss of dermis presenting as a shallow open injury with a red, pink wound bed without slough. (Active)  03/12/21 1131  Location: Thigh  Location Orientation: Right;Upper  Staging: Stage 2 -  Partial thickness loss of dermis presenting as a shallow open injury with a red, pink wound bed without slough.  Wound Description (Comments):   Present on Admission: Yes     Pressure Injury 03/23/21 Pretibial Distal;Left Stage 3 -  Full thickness tissue loss. Subcutaneous fat may be visible but bone, tendon or muscle are NOT exposed.  7 cm X 4.5 cm no tunneling (Active)  03/23/21 1344  Location: Pretibial  Location Orientation: Distal;Left  Staging: Stage 3 -  Full thickness tissue loss. Subcutaneous fat may be visible but bone, tendon or muscle are NOT exposed.  Wound Description (Comments): 7 cm X 4.5 cm no tunneling  Present on Admission: Yes     Pressure Injury 03/23/21 Tibial Left;Posterior;Proximal Stage 3 -  Full thickness tissue loss. Subcutaneous fat may be visible but bone, tendon or muscle are NOT exposed. 1 cm X 1 Cm with yellow sloth present (Active)  03/23/21 1357  Location: Tibial  Location Orientation: Left;Posterior;Proximal  Staging: Stage 3 -  Full thickness tissue loss. Subcutaneous fat may be visible but bone, tendon or muscle are NOT exposed.  Wound Description  (Comments): 1 cm X 1 Cm with yellow sloth present  Present on Admission: Yes     Pressure Injury 03/23/21 Thigh Anterior;Left Stage 2 -  Partial thickness loss of dermis presenting as a shallow open injury with a red, pink wound bed without slough. 2 cm X 0.5 cm pink wound bed (Active)  03/23/21 1358  Location: Thigh  Location Orientation: Anterior;Left  Staging: Stage 2 -  Partial thickness loss of dermis presenting as a shallow open injury with a red, pink wound bed without slough.  Wound Description (Comments): 2 cm X 0.5 cm pink wound bed  Present on Admission:      Pressure Injury 03/23/21 Thigh Anterior;Right Stage 2 -  Partial thickness loss of dermis presenting as a shallow open injury with a red, pink wound bed without slough. 2 cm x 1 cm pink wound bed (Active)  03/23/21 1400  Location: Thigh  Location Orientation: Anterior;Right  Staging: Stage 2 -  Partial thickness loss of dermis presenting as a shallow open injury with a red, pink wound bed without slough.  Wound Description (Comments): 2 cm x 1 cm pink wound bed  Present on Admission: Yes     Pressure Injury 03/23/21 Buttocks Right;Posterior Stage 2 -  Partial thickness loss of dermis presenting as a shallow open injury with a red, pink wound bed without slough. 0.5 cm x 0.5 cm pink wound bed (Active)  03/23/21 1402  Location: Buttocks  Location Orientation: Right;Posterior  Staging: Stage 2 -  Partial thickness loss of dermis presenting as a shallow open injury with a red, pink wound bed without slough.  Wound Description (Comments): 0.5 cm x 0.5 cm pink wound bed  Present on Admission: Yes     Pressure Injury 03/23/21 Buttocks Right Stage 2 -  Partial thickness loss of dermis presenting as a shallow open injury with a red, pink wound bed without slough. 3 cm x 1 cm pink wound bed (Active)  03/23/21 1404  Location: Buttocks  Location Orientation: Right  Staging: Stage 2 -  Partial thickness loss of dermis presenting as  a shallow open injury with a red, pink wound bed without slough.  Wound Description (Comments): 3 cm x 1 cm pink wound bed  Present on Admission: Yes     Consultants:  Vascular surgery  Orthopedics   Procedures:  Right leg revascularization   Antimicrobials:  IV vancomycin     Subjective: Patient reports mild pain lower extremities, controlled with analgesics, no nausea or vomiting. No appetite, had decline PICC line and blood drawn.   Objective: Vitals:   04/26/21 0527 04/26/21 1156 04/26/21 2004 04/27/21 0436  BP: (!) 89/59 91/66 109/72 113/77  Pulse: 77 90 85 85  Resp: 19 18 19 17   Temp: 98.2 F (36.8 C) 98.7 F (37.1 C) 98.6 F (37 C) 98.1 F (36.7 C)  TempSrc: Oral Oral    SpO2: 96%  90% 98%  Weight: 76.8 kg   77 kg  Height:        Intake/Output Summary (Last 24 hours) at 04/27/2021 1031 Last data filed at 04/27/2021 0449 Gross per 24 hour  Intake 360 ml  Output 700 ml  Net -340 ml   Filed Weights   04/25/21 0254 04/26/21 0527 04/27/21 0436  Weight: 76.6 kg 76.8 kg 77 kg    Examination:   General: Not in pain or dyspnea, deconditioned  Neurology: Awake and alert, non focal  E ENT: mild pallor, no icterus, oral mucosa moist Cardiovascular: No JVD. S1-S2 present, rhythmic, no gallops, rubs, or murmurs. No lower extremity edema. Pulmonary: positive breath sounds bilaterally, adequate air movement, no wheezing, rhonchi or rales. Gastrointestinal. Abdomen soft and non tender Skin. Necrotic lesions bilateral lower extremities.,  Musculoskeletal: no joint deformities     Data Reviewed: I have personally reviewed following labs and imaging studies  CBC: Recent Labs  Lab 04/21/21 1019 04/22/21 0257 04/25/21 2121 04/27/21 0339  WBC 12.8* 8.2 9.8 9.5  NEUTROABS 10.1* 6.6 6.9 6.4  HGB 10.8* 10.3* 8.4* 7.6*  HCT 36.9 34.7* 28.4* 25.7*  MCV 95.6 94.0 95.3 95.5  PLT 359 345 296 096   Basic Metabolic Panel: Recent Labs  Lab 04/21/21 1019  04/22/21 0257 04/23/21 0637 04/25/21 0818 04/26/21 1409 04/27/21 0339  NA 140 140 138 144 142 143  K 2.6* 3.5 4.0 3.5 3.3* 3.1*  CL 99 107 108 112* 114* 113*  CO2 27 23 17* 19* 21* 22  GLUCOSE 165* 134* 157* 126* 171* 129*  BUN 13 12 14 14 14 13   CREATININE 1.33* 1.33* 1.28* 1.64* 1.65* 1.69*  CALCIUM 8.7* 8.3* 8.7* 8.9 8.7* 8.8*  MG 1.5*  TEST WILL BE CREDITED 2.3 2.1 1.8  --   --   PHOS  --  3.3 2.9  --   --   --    GFR: Estimated Creatinine Clearance: 34.9 mL/min (A) (by C-G formula based on SCr of 1.69 mg/dL (H)). Liver Function Tests: Recent Labs  Lab 04/21/21 1019 04/22/21 0257  AST 12* 10*  ALT 9 6  ALKPHOS 82 66  BILITOT 1.0 0.6  PROT 7.1 6.2*  ALBUMIN 2.3* 1.8*   No results for input(s): LIPASE, AMYLASE in the last 168 hours. No results for input(s): AMMONIA in the last 168 hours. Coagulation Profile: Recent Labs  Lab 04/21/21 1019  INR 1.7*   Cardiac Enzymes: No results for input(s): CKTOTAL, CKMB, CKMBINDEX, TROPONINI in the last 168 hours. BNP (last 3 results) No results for input(s): PROBNP in the last 8760 hours. HbA1C: No results for input(s): HGBA1C in the last 72 hours. CBG: Recent Labs  Lab 04/25/21 2123 04/26/21 1155 04/26/21 1635 04/26/21 2122 04/27/21 0608  GLUCAP 132* 209* 113* 142* 138*   Lipid Profile: No results for input(s): CHOL, HDL, LDLCALC, TRIG, CHOLHDL, LDLDIRECT in the last 72 hours. Thyroid Function Tests: No results for input(s): TSH, T4TOTAL, FREET4, T3FREE, THYROIDAB in the last 72 hours. Anemia Panel: No results for input(s): VITAMINB12, FOLATE, FERRITIN, TIBC, IRON, RETICCTPCT in the last 72 hours.    Radiology Studies: I have reviewed all of the imaging during this hospital visit personally     Scheduled Meds:  amLODipine  10 mg Oral Daily   aspirin EC  81 mg Oral Daily   DULoxetine  60 mg Oral Daily   enoxaparin (LOVENOX) injection  75 mg Subcutaneous Q12H   insulin aspart  0-5 Units Subcutaneous QHS    insulin aspart  0-9 Units Subcutaneous TID WC   insulin aspart  3 Units Subcutaneous TID WC   levETIRAcetam  750 mg Oral BID   metoprolol tartrate  50 mg Oral BID   oxybutynin  5 mg Oral QHS   pantoprazole  40 mg Oral Daily   rosuvastatin  10 mg Oral Daily   traZODone  50 mg Oral QHS   zinc sulfate  220 mg Oral Daily   Continuous Infusions:  sodium chloride Stopped (04/25/21 1900)   vancomycin       LOS: 6 days        Jahmiyah Dullea Gerome Apley, MD

## 2021-04-28 DIAGNOSIS — I70263 Atherosclerosis of native arteries of extremities with gangrene, bilateral legs: Secondary | ICD-10-CM | POA: Diagnosis not present

## 2021-04-28 DIAGNOSIS — Z9181 History of falling: Secondary | ICD-10-CM | POA: Diagnosis not present

## 2021-04-28 DIAGNOSIS — A419 Sepsis, unspecified organism: Secondary | ICD-10-CM | POA: Diagnosis not present

## 2021-04-28 DIAGNOSIS — I4891 Unspecified atrial fibrillation: Secondary | ICD-10-CM | POA: Diagnosis not present

## 2021-04-28 LAB — BASIC METABOLIC PANEL
Anion gap: 6 (ref 5–15)
BUN: 13 mg/dL (ref 6–20)
CO2: 21 mmol/L — ABNORMAL LOW (ref 22–32)
Calcium: 8.6 mg/dL — ABNORMAL LOW (ref 8.9–10.3)
Chloride: 113 mmol/L — ABNORMAL HIGH (ref 98–111)
Creatinine, Ser: 1.65 mg/dL — ABNORMAL HIGH (ref 0.44–1.00)
GFR, Estimated: 36 mL/min — ABNORMAL LOW (ref >60)
Glucose, Bld: 153 mg/dL — ABNORMAL HIGH (ref 70–99)
Potassium: 4.3 mmol/L (ref 3.5–5.1)
Sodium: 140 mmol/L (ref 135–145)

## 2021-04-28 LAB — GLUCOSE, CAPILLARY
Glucose-Capillary: 151 mg/dL — ABNORMAL HIGH (ref 70–99)
Glucose-Capillary: 138 mg/dL — ABNORMAL HIGH (ref 70–99)
Glucose-Capillary: 113 mg/dL — ABNORMAL HIGH (ref 70–99)
Glucose-Capillary: 159 mg/dL — ABNORMAL HIGH (ref 70–99)

## 2021-04-28 NOTE — Progress Notes (Signed)
Pt. Verbalized " I've been seing stuff, snake is coming form the door and I'm scared. Pt was reassured that there is no snake in the room. Pt  is watching television at this time,calm . "Can you help me, I'm cold". And continue watching  TV.

## 2021-04-28 NOTE — Progress Notes (Signed)
PROGRESS NOTE    [name]  XBJ:[id number] DOB: 07/19/1966 DOA: 04/21/2021 PCP: Caprice Renshaw, MD    Brief Narrative:  [name] was admitted with the working diagnosis of sepsis due to bilateral lower extremity leg ulcerations with cellulitis and dry gangrene    55 year old female past medical history for tobacco abuse, severe peripheral vascular disease, paroxysmal atrial fibrillation, hypertension, dyslipidemia, type 2 diabetes mellitus, diastolic heart failure, chronic kidney disease who presented with bilateral leg pain.  She was noted to have decreased p.o. intake at her nursing facility.  She does have chronic ulcerated lesions bilateral legs.  On her initial physical examination temperature 98.1, heart rate 150, respiratory rate 22, heart rate 90, blood pressure 116/92, oxygen saturation 93%, her lungs were clear to auscultation bilaterally, heart S1-S2, present, rhythmic, soft abdomen, positive pressure ulcers ventral side of distal lower extremities.  Stage IV pressure ulcers with necrotic skin.    Patient was placed on broad-spectrum antibiotic therapy with vancomycin and cefepime. Vascular surgery and orthopedic were consulted.   Patient was scheduled to have left common femoral artery access aortogram and right lower extremity angiography but patient declined to proceed on the Cath Lab table. This was discussed with the sister, and procedure will be reattempted under general anesthesia.    06/29 Sp right superficial femoral and popliteal artery stenting   Positive blood cultures for staph aureus #2/2    Patient with poor IV acces, has declined PICC line several times.    Poor oral intake and continue to decline IV.   Assessment & Plan:   Principal Problem:   Sepsis ([place]) Active Problems:   Essential hypertension   INSOMNIA   Medical non-compliance   Poor mobility   Lower extremity weakness   HTN (hypertension), malignant   Hypokalemia   Diabetic neuropathy  (HCC)   CKD (chronic kidney disease) stage 3, GFR 30-59 ml/min (HCC)   Severe recurrent major depression without psychotic features (HCC)   COPD (chronic obstructive pulmonary disease) (HCC)   At high risk for falls   PAF (paroxysmal atrial fibrillation) (HCC)   Atrial fibrillation with RVR (HCC)   Seizures (HCC)   Sacral ulcer, limited to breakdown of skin (HCC)   Reflux esophagitis   Multiple duodenal ulcers   Chronic respiratory failure with hypoxia (HCC)   Hypomagnesemia   PVD (peripheral vascular disease) (HCC)   Atherosclerosis of native artery of both legs with gangrene ([place])   Sepsis due to bilateral lower extremity cellulitis with dry gangrene, in the setting of severe PVD. (Present on admission)/ Staphylococcus bacteremia. Blood cultures #2/2 positive for staphylococcus cutis Sp right superficial femoral and popliteal artery stenting.   Follow blood cultures continue with no growth. Staphylococcus cutis sensitive to vancomycin resistant to cipro and erythromycin. Echocardiogram with no evident vegetation.  No IV access, changed antibiotic to po, with doxycycline for now. Anticoagulation with enoxaparin and antiplatelet with asa.  Pending contralateral leg revascularization and final recommendations from orthopedics.    2. T2DM. Fasting glucose today is 153, will continue sliding scale for glucose cover and monitoring    3. Chronic hypoxemic respiratory failure. Continue with supplemental 02 per [place] [facility].     4. Paroxysmal atrial fibrillation. Rate control with metoprolol and anticoagulation with apixaban.   5., HTN Dyslipidemia.  Continue blood pressure control with amlodipine and metoprolol  Continue with statin    6. AKI on CKD stage 3b/ hypokalemia, non anion gap metabolic acidosis.  Patient with very poor oral intake, she  has declined further IV access.  Serum cr is 1,65 with K at 4,3 and CL at 113 with bicarbonate at 21.    7. Depression.  On lorazepam,  duloxetine and trazodone  On keppra 750 mg bid.  Continue to have swings in her mood. Consult psychiatrics    8. Chronic anemia, chronic disease. Follow H&H in 48 hrs .   Status is: Inpatient  Remains inpatient appropriate because:Inpatient level of care appropriate due to severity of illness  Dispo: The patient is from: Home              Anticipated d/c is to: Home              Patient currently is not medically stable to d/c.   Difficult to place patient No   DVT prophylaxis: Enoxaparin   Code Status:   Full  Family Communication:   I spoke over the phone with the patient's brother/ son about patient's  condition, plan of care, prognosis and all questions were addressed.    Skin Documentation: Pressure Injury 12/03/20 Perineum Posterior Stage 1 -  Intact skin with non-blanchable redness of a localized area usually over a bony prominence. (Active)  12/03/20 2210  Location: Perineum  Location Orientation: Posterior  Staging: Stage 1 -  Intact skin with non-blanchable redness of a localized area usually over a bony prominence.  Wound Description (Comments):   Present on Admission: Yes     Pressure Injury 12/04/20 Coccyx Right;Medial Stage 2 -  Partial thickness loss of dermis presenting as a shallow open injury with a red, pink wound bed without slough. 1cm x 0.5cm area to the right of previously healed pressure ulcer (Active)  12/04/20 0800  Location: Coccyx  Location Orientation: Right;Medial  Staging: Stage 2 -  Partial thickness loss of dermis presenting as a shallow open injury with a red, pink wound bed without slough.  Wound Description (Comments): 1cm x 0.5cm area to the right of previously healed pressure ulcer  Present on Admission: Yes     Pressure Injury 03/12/21 Sacrum Mid Stage 2 -  Partial thickness loss of dermis presenting as a shallow open injury with a red, pink wound bed without slough. (Active)  03/12/21 1128  Location: Sacrum  Location Orientation: Mid   Staging: Stage 2 -  Partial thickness loss of dermis presenting as a shallow open injury with a red, pink wound bed without slough.  Wound Description (Comments):   Present on Admission: Yes     Pressure Injury 03/12/21 Thigh Right;Upper Stage 2 -  Partial thickness loss of dermis presenting as a shallow open injury with a red, pink wound bed without slough. (Active)  03/12/21 1131  Location: Thigh  Location Orientation: Right;Upper  Staging: Stage 2 -  Partial thickness loss of dermis presenting as a shallow open injury with a red, pink wound bed without slough.  Wound Description (Comments):   Present on Admission: Yes     Pressure Injury 03/23/21 Pretibial Distal;Left Stage 3 -  Full thickness tissue loss. Subcutaneous fat may be visible but bone, tendon or muscle are NOT exposed. 7 cm X 4.5 cm no tunneling (Active)  03/23/21 1344  Location: Pretibial  Location Orientation: Distal;Left  Staging: Stage 3 -  Full thickness tissue loss. Subcutaneous fat may be visible but bone, tendon or muscle are NOT exposed.  Wound Description (Comments): 7 cm X 4.5 cm no tunneling  Present on Admission: Yes     Pressure Injury 03/23/21 Tibial Left;Posterior;Proximal Stage  3 -  Full thickness tissue loss. Subcutaneous fat may be visible but bone, tendon or muscle are NOT exposed. 1 cm X 1 Cm with yellow sloth present (Active)  03/23/21 1357  Location: Tibial  Location Orientation: Left;Posterior;Proximal  Staging: Stage 3 -  Full thickness tissue loss. Subcutaneous fat may be visible but bone, tendon or muscle are NOT exposed.  Wound Description (Comments): 1 cm X 1 Cm with yellow sloth present  Present on Admission: Yes     Pressure Injury 03/23/21 Thigh Anterior;Left Stage 2 -  Partial thickness loss of dermis presenting as a shallow open injury with a red, pink wound bed without slough. 2 cm X 0.5 cm pink wound bed (Active)  03/23/21 1358  Location: Thigh  Location Orientation: Anterior;Left   Staging: Stage 2 -  Partial thickness loss of dermis presenting as a shallow open injury with a red, pink wound bed without slough.  Wound Description (Comments): 2 cm X 0.5 cm pink wound bed  Present on Admission:      Pressure Injury 03/23/21 Thigh Anterior;Right Stage 2 -  Partial thickness loss of dermis presenting as a shallow open injury with a red, pink wound bed without slough. 2 cm x 1 cm pink wound bed (Active)  03/23/21 1400  Location: Thigh  Location Orientation: Anterior;Right  Staging: Stage 2 -  Partial thickness loss of dermis presenting as a shallow open injury with a red, pink wound bed without slough.  Wound Description (Comments): 2 cm x 1 cm pink wound bed  Present on Admission: Yes     Pressure Injury 03/23/21 Buttocks Right;Posterior Stage 2 -  Partial thickness loss of dermis presenting as a shallow open injury with a red, pink wound bed without slough. 0.5 cm x 0.5 cm pink wound bed (Active)  03/23/21 1402  Location: Buttocks  Location Orientation: Right;Posterior  Staging: Stage 2 -  Partial thickness loss of dermis presenting as a shallow open injury with a red, pink wound bed without slough.  Wound Description (Comments): 0.5 cm x 0.5 cm pink wound bed  Present on Admission: Yes     Pressure Injury 03/23/21 Buttocks Right Stage 2 -  Partial thickness loss of dermis presenting as a shallow open injury with a red, pink wound bed without slough. 3 cm x 1 cm pink wound bed (Active)  03/23/21 1404  Location: Buttocks  Location Orientation: Right  Staging: Stage 2 -  Partial thickness loss of dermis presenting as a shallow open injury with a red, pink wound bed without slough.  Wound Description (Comments): 3 cm x 1 cm pink wound bed  Present on Admission: Yes   Consultants:  Vascular surgery Orthopedics    Procedures:  Right leg revascularization    Antimicrobials:  Doxycycline        Subjective: Patient continue to refuse IV, very poor oral intake, no  nausea or vomiting.   Objective: Vitals:   04/28/21 0002 04/28/21 0617 04/28/21 0642 04/28/21 1114  BP: 97/71 99/77  (!) 132/96  Pulse: 78 77  90  Resp: 18 14  16   Temp: 98.4 F (36.9 C) 98.2 F (36.8 C)  98.4 F (36.9 C)  TempSrc: Oral Oral  Oral  SpO2: 100% 100%  97%  Weight:   76.7 kg   Height:        Intake/Output Summary (Last 24 hours) at 04/28/2021 1418 Last data filed at 04/28/2021 1118 Gross per 24 hour  Intake 1320 ml  Output 450 ml  Net 870 ml   Filed Weights   04/26/21 0527 04/27/21 0436 04/28/21 0642  Weight: 76.8 kg 77 kg 76.7 kg    Examination:   General: Not in pain or dyspnea, deconditioned  Neurology: Awake and alert, non focal  E ENT: mild pallor, no icterus, oral mucosa moist Cardiovascular: No JVD. S1-S2 present, rhythmic, no gallops, rubs, or murmurs. No lower extremity edema. Pulmonary: positive breath sounds bilaterally, adequate air movement, no wheezing, rhonchi or rales. Gastrointestinal. Abdomen soft and non tender Skin. No rashes Musculoskeletal: no joint deformities     Data Reviewed: I have personally reviewed following labs and imaging studies  CBC: Recent Labs  Lab 04/22/21 0257 04/25/21 2121 04/27/21 0339  WBC 8.2 9.8 9.5  NEUTROABS 6.6 6.9 6.4  HGB 10.3* 8.4* 7.6*  HCT 34.7* 28.4* 25.7*  MCV 94.0 95.3 95.5  PLT 345 296 163   Basic Metabolic Panel: Recent Labs  Lab 04/22/21 0257 04/23/21 0637 04/25/21 0818 04/26/21 1409 04/27/21 0339 04/28/21 0242  NA 140 138 144 142 143 140  K 3.5 4.0 3.5 3.3* 3.1* 4.3  CL 107 108 112* 114* 113* 113*  CO2 23 17* 19* 21* 22 21*  GLUCOSE 134* 157* 126* 171* 129* 153*  BUN 12 14 14 14 13 13   CREATININE 1.33* 1.28* 1.64* 1.65* 1.69* 1.65*  CALCIUM 8.3* 8.7* 8.9 8.7* 8.8* 8.6*  MG 2.3 2.1 1.8  --   --   --   PHOS 3.3 2.9  --   --   --   --    GFR: Estimated Creatinine Clearance: 35.7 mL/min (A) (by C-G formula based on SCr of 1.65 mg/dL (H)). Liver Function Tests: Recent Labs   Lab 04/22/21 0257  AST 10*  ALT 6  ALKPHOS 66  BILITOT 0.6  PROT 6.2*  ALBUMIN 1.8*   No results for input(s): LIPASE, AMYLASE in the last 168 hours. No results for input(s): AMMONIA in the last 168 hours. Coagulation Profile: No results for input(s): INR, PROTIME in the last 168 hours. Cardiac Enzymes: No results for input(s): CKTOTAL, CKMB, CKMBINDEX, TROPONINI in the last 168 hours. BNP (last 3 results) No results for input(s): PROBNP in the last 8760 hours. HbA1C: No results for input(s): HGBA1C in the last 72 hours. CBG: Recent Labs  Lab 04/26/21 2122 04/27/21 0608 04/27/21 1202 04/28/21 0634 04/28/21 1204  GLUCAP 142* 138* 129* 151* 138*   Lipid Profile: No results for input(s): CHOL, HDL, LDLCALC, TRIG, CHOLHDL, LDLDIRECT in the last 72 hours. Thyroid Function Tests: No results for input(s): TSH, T4TOTAL, FREET4, T3FREE, THYROIDAB in the last 72 hours. Anemia Panel: No results for input(s): VITAMINB12, FOLATE, FERRITIN, TIBC, IRON, RETICCTPCT in the last 72 hours.    Radiology Studies: I have reviewed all of the imaging during this hospital visit personally     Scheduled Meds:  amLODipine  10 mg Oral Daily   aspirin EC  81 mg Oral Daily   doxycycline  100 mg Oral Q12H   DULoxetine  60 mg Oral Daily   enoxaparin (LOVENOX) injection  75 mg Subcutaneous Q12H   insulin aspart  0-5 Units Subcutaneous QHS   insulin aspart  0-9 Units Subcutaneous TID WC   insulin aspart  3 Units Subcutaneous TID WC   levETIRAcetam  750 mg Oral BID   metoprolol tartrate  50 mg Oral BID   oxybutynin  5 mg Oral QHS   pantoprazole  40 mg Oral Daily   rosuvastatin  10 mg Oral Daily  traZODone  50 mg Oral QHS   zinc sulfate  220 mg Oral Daily   Continuous Infusions:  sodium chloride Stopped (04/25/21 1900)     LOS: 7 days        Doreatha Offer Gerome Apley, MD

## 2021-04-29 DIAGNOSIS — F331 Major depressive disorder, recurrent, moderate: Secondary | ICD-10-CM | POA: Insufficient documentation

## 2021-04-29 DIAGNOSIS — A419 Sepsis, unspecified organism: Secondary | ICD-10-CM | POA: Diagnosis not present

## 2021-04-29 DIAGNOSIS — I70263 Atherosclerosis of native arteries of extremities with gangrene, bilateral legs: Secondary | ICD-10-CM | POA: Diagnosis not present

## 2021-04-29 DIAGNOSIS — F99 Mental disorder, not otherwise specified: Secondary | ICD-10-CM

## 2021-04-29 DIAGNOSIS — Z9181 History of falling: Secondary | ICD-10-CM | POA: Diagnosis not present

## 2021-04-29 DIAGNOSIS — I4891 Unspecified atrial fibrillation: Secondary | ICD-10-CM | POA: Diagnosis not present

## 2021-04-29 DIAGNOSIS — F5105 Insomnia due to other mental disorder: Secondary | ICD-10-CM | POA: Diagnosis not present

## 2021-04-29 LAB — GLUCOSE, CAPILLARY
Glucose-Capillary: 134 mg/dL — ABNORMAL HIGH (ref 70–99)
Glucose-Capillary: 129 mg/dL — ABNORMAL HIGH (ref 70–99)
Glucose-Capillary: 130 mg/dL — ABNORMAL HIGH (ref 70–99)
Glucose-Capillary: 115 mg/dL — ABNORMAL HIGH (ref 70–99)
Glucose-Capillary: 167 mg/dL — ABNORMAL HIGH (ref 70–99)

## 2021-04-29 LAB — BASIC METABOLIC PANEL
Anion gap: 7 (ref 5–15)
BUN: 12 mg/dL (ref 6–20)
CO2: 25 mmol/L (ref 22–32)
Calcium: 8.8 mg/dL — ABNORMAL LOW (ref 8.9–10.3)
Chloride: 111 mmol/L (ref 98–111)
Creatinine, Ser: 1.5 mg/dL — ABNORMAL HIGH (ref 0.44–1.00)
GFR, Estimated: 41 mL/min — ABNORMAL LOW (ref >60)
Glucose, Bld: 153 mg/dL — ABNORMAL HIGH (ref 70–99)
Potassium: 3.3 mmol/L — ABNORMAL LOW (ref 3.5–5.1)
Sodium: 143 mmol/L (ref 135–145)

## 2021-04-29 LAB — HEMOGLOBIN AND HEMATOCRIT, BLOOD
HCT: 26.7 % — ABNORMAL LOW (ref 36.0–46.0)
Hemoglobin: 7.7 g/dL — ABNORMAL LOW (ref 12.0–15.0)

## 2021-04-29 LAB — MAGNESIUM: Magnesium: 1.6 mg/dL — ABNORMAL LOW (ref 1.7–2.4)

## 2021-04-29 MED ORDER — MAGNESIUM SULFATE 2 GM/50ML IV SOLN
2.0000 g | Freq: Once | INTRAVENOUS | Status: AC
  Administered 2021-04-29: 2 g via INTRAVENOUS
  Filled 2021-04-29: qty 50

## 2021-04-29 MED ORDER — ENOXAPARIN SODIUM 80 MG/0.8ML IJ SOSY: 75.0000 mg | PREFILLED_SYRINGE | Freq: Two times a day (BID) | INTRAMUSCULAR | Status: AC

## 2021-04-29 MED ORDER — ENOXAPARIN SODIUM 80 MG/0.8ML IJ SOSY
75.0000 mg | PREFILLED_SYRINGE | Freq: Two times a day (BID) | INTRAMUSCULAR | Status: DC
  Administered 2021-04-30 – 2021-05-02 (×5): 75 mg via SUBCUTANEOUS
  Filled 2021-04-29: qty 0.8
  Filled 2021-04-29 (×3): qty 0.75
  Filled 2021-04-29: qty 0.8
  Filled 2021-04-29: qty 0.75

## 2021-04-29 MED ORDER — QUETIAPINE FUMARATE 25 MG PO TABS
25.0000 mg | ORAL_TABLET | Freq: Every day | ORAL | Status: DC
  Administered 2021-04-29 – 2021-05-02 (×4): 25 mg via ORAL
  Filled 2021-04-29 (×4): qty 1

## 2021-04-29 MED ORDER — VANCOMYCIN HCL 1500 MG/300ML IV SOLN
1500.0000 mg | Freq: Once | INTRAVENOUS | Status: AC
  Administered 2021-04-29: 1500 mg via INTRAVENOUS
  Filled 2021-04-29: qty 300

## 2021-04-29 MED ORDER — POTASSIUM CHLORIDE CRYS ER 20 MEQ PO TBCR: 20.0000 meq | EXTENDED_RELEASE_TABLET | Freq: Once | ORAL | Status: DC

## 2021-04-29 MED ORDER — VANCOMYCIN HCL 750 MG/150ML IV SOLN
750.0000 mg | INTRAVENOUS | Status: DC
  Administered 2021-04-30 – 2021-05-03 (×4): 750 mg via INTRAVENOUS
  Filled 2021-04-29 (×6): qty 150

## 2021-04-29 NOTE — Consult Note (Signed)
[facility] Consult   Reason for Consult:  Depression, mood swings? Referring Physician:  Sander Radon, MD Patient Identification: [name] MRN:  [id number] Principal Diagnosis: Sepsis [facility]) Diagnosis:  Principal Problem:   Sepsis ([place]) Active Problems:   Essential hypertension   INSOMNIA   Medical non-compliance   Poor mobility   Lower extremity weakness   HTN (hypertension), malignant   Hypokalemia   Diabetic neuropathy ([place])   CKD (chronic kidney disease) stage 3, GFR 30-59 ml/min (HCC)   Severe recurrent major depression without psychotic features ([place])   COPD (chronic obstructive pulmonary disease) (HCC)   At high risk for falls   PAF (paroxysmal atrial fibrillation) (HCC)   Atrial fibrillation with RVR (HCC)   Seizures (HCC)   Sacral ulcer, limited to breakdown of skin (HCC)   Reflux esophagitis   Multiple duodenal ulcers   Chronic respiratory failure with hypoxia (HCC)   Hypomagnesemia   PVD (peripheral vascular disease) (HCC)   Atherosclerosis of native artery of both legs with gangrene ([place])   MDD (major depressive disorder), recurrent episode, moderate ([place])   Total Time spent with patient: 45 minutes  Subjective:   [name] is a 55 y.o. female patient admitted with bilateral leg pain w/ PMH of depression, tobacco use disorder, severe PVD, paroxysmal atrial fibrillation currently on anticoagulant, hyperlipidemia, hypertension, uncontrolled T2DM, CHF, and CKD.  HPI:  On assessment this AM patient is bundled in her blankets reluctant to move. Provider noted patient's Heat was turned to the max but she still asked for another blanket because she was cold. Patient was irritable and reluctant to talk but she did give short answers for assessment. Patient reported that she was most troubled by recent onset of VH. Patient reported she has been seeing snakes for the past 3 weeks and this has never happened before. Patient reports because of  this she has not been sleeping well and tries to sleep with the light on. Patient endorses anhedonia, feelings of hopelessness, decreased, energy, decreased concentration, and decreased appetite. Patient denies SI, HI and AH. Patient endorses that she does find herself constantly anxious/ on edge and reports that she worries about "things" but is not able to pin down certain "things" that concern her. Patient denies and symptoms of manic episodes in the past and denies hx of trauma.   Past Psychiatric History: Patient reports she takes Cymbalta 60mg  for depression and Trazodone 50mg  but is unsure who prescribes it; however per EMR her facility has a provider who may be the prescriber.  Risk to Self:  NO Risk to Others:  NO Prior Inpatient Therapy:  NO Prior Outpatient Therapy:  NO  Past Medical History:  Past Medical History:  Diagnosis Date   Alcohol use    Ankle fracture, lateral malleolus, closed 2013   Anxiety    Aortic atherosclerosis ([place]) 03/10/2021   Breast mass, left 2013   CHF (congestive heart failure) ([place])    a. EF 20-25% by echo in 05/2016 with cath showing normal cors b. EF 50-55% in 07/2020 c. 01/2021: EF at 55-60% with moderate LVH   Chronic anemia    CKD (chronic kidney disease), stage III (HCC)    Cocaine abuse (HCC)    COPD (chronic obstructive pulmonary disease) ([place])    Diabetes mellitus, type 2 ([place])    Diabetic Charcot foot ([place])    Essential hypertension    History of cardiomyopathy    History of GI bleed 2011   Hyperlipidemia  Noncompliance    Obesity    PAF (paroxysmal atrial fibrillation) (HCC)    Panic attacks    PAT (paroxysmal atrial tachycardia) (HCC)    Previously on Amiodarone   Seizures ([place]) 10/01/2020   Sleep apnea    Not on CPAP   Stroke ([place]) 2018   Tobacco abuse    Urinary incontinence     Past Surgical History:  Procedure Laterality Date   ANGIOPLASTY ILLIAC ARTERY Right 04/23/2021   Procedure: ANGIOPLASTY AND STENT SUPERFICIAL  FEMORAL ARTERY;  Surgeon: Cherre Robins, MD;  Location: [facility];  Service: Vascular;  Laterality: Right;   AORTOGRAM  04/23/2021   Procedure: AORTOGRAM;  Surgeon: Cherre Robins, MD;  Location: [facility];  Service: Vascular;;   BIOPSY  11/12/2020   Procedure: BIOPSY;  Surgeon: Harvel Quale, MD;  [facility];  Service: Gastroenterology;;   BREAST BIOPSY     CARDIAC CATHETERIZATION N/A 07/28/2016   Procedure: Left Heart Cath and Coronary Angiography;  Surgeon: Jettie Booze, MD;  Location: [facility];  Service: Cardiovascular;  Laterality: N/A;   COLONOSCOPY N/A 05/10/2019   Procedure: COLONOSCOPY;  Surgeon: Danie Binder, MD;  [facility];  Service: Endoscopy;  Laterality: N/A;  Phenergan 12.5 mg IV in pre-op   DILATION AND CURETTAGE OF UTERUS     ESOPHAGOGASTRODUODENOSCOPY (EGD) WITH PROPOFOL N/A 11/12/2020   Procedure: ESOPHAGOGASTRODUODENOSCOPY (EGD) WITH PROPOFOL;  Surgeon: Harvel Quale, MD;  [facility];  Service: Gastroenterology;  Laterality: N/A;   I & D EXTREMITY Bilateral 09/22/2017   Procedure: BILATERAL DEBRIDEMENT LEG/FOOT ULCERS, APPLY VERAFLO WOUND VAC;  Surgeon: Newt Minion, MD;  Location: [facility];  Service: Orthopedics;  Laterality: Bilateral;   I & D EXTREMITY Right 10/11/2018   Procedure: IRRIGATION AND DEBRIDEMENT RIGHT HAND;  Surgeon: [name], MD;  Location: [facility];  Service: Orthopedics;  Laterality: Right;   I & D EXTREMITY Right 10/13/2018   Procedure: REPEAT IRRIGATION AND DEBRIDEMENT RIGHT HAND;  Surgeon: [name], MD;  Location: [facility];  Service: Orthopedics;  Laterality: Right;   I & D EXTREMITY Right 11/22/2018   Procedure: IRRIGATION AND DEBRIDEMENT AND PINNING RIGHT HAND;  Surgeon: [name], MD;  Location: [facility];  Service: Orthopedics;  Laterality: Right;   IR RADIOLOGIST EVAL & MGMT  07/05/2018   LOWER EXTREMITY ANGIOGRAM Bilateral 04/23/2021   Procedure: RIGHT  AND LEFT  LOWER EXTREMITY ANGIOGRAM;  Surgeon: Cherre Robins, MD;  Location: [facility];  Service: Vascular;  Laterality: Bilateral;   POLYPECTOMY  05/10/2019   Procedure: POLYPECTOMY;  Surgeon: Danie Binder, MD;  [facility];  Service: Endoscopy;;   SKIN SPLIT GRAFT Bilateral 09/28/2017   Procedure: BILATERAL SPLIT THICKNESS SKIN GRAFT LEGS/FEET AND APPLY VAC;  Surgeon: Newt Minion, MD;  Location: [facility];  Service: Orthopedics;  Laterality: Bilateral;   SKIN SPLIT GRAFT Right 11/22/2018   Procedure: SKIN GRAFT SPLIT THICKNESS;  Surgeon: [name], MD;  Location: [facility];  Service: Orthopedics;  Laterality: Right;   Family History:  Family History  Problem Relation Age of Onset   Hypertension Mother    Heart attack Mother    Hypertension Father        CABG    Hypertension Sister    Hypertension Brother    Hypertension Sister    Cancer Sister        breast    Arthritis Other    Cancer Other  Diabetes Other    Asthma Other    Hypertension Daughter    Hypertension Son    Family Psychiatric  History: 2 Aunts with depression and anxiety. Social History:  Social History   Substance and Sexual Activity  Alcohol Use Not Currently   Alcohol/week: 0.0 standard drinks   Comment: Quit in 2017     Social History   Substance and Sexual Activity  Drug Use Not Currently   Frequency: 3.0 times per week   Types: Marijuana, Cocaine   Comment: none since August 2018    Social History   Socioeconomic History   Marital status: Widowed    Spouse name: Not on file   Number of children: 2   Years of education: 9   Highest education level: 9th grade  Occupational History   Occupation: Disabled  Tobacco Use   Smoking status: Some Days    Packs/day: 0.25    Years: 20.00    Pack years: 5.00    Types: Cigarettes    Last attempt to quit: 06/03/2017    Years since quitting: 3.9   Smokeless tobacco: Never  Vaping Use   Vaping Use: Never used  Substance and Sexual Activity    Alcohol use: Not Currently    Alcohol/week: 0.0 standard drinks    Comment: Quit in 2017   Drug use: Not Currently    Frequency: 3.0 times per week    Types: Marijuana, Cocaine    Comment: none since August 2018   Sexual activity: Not on file  Other Topics Concern   Not on file  Social History Narrative   Right handed   Drinks 1-2 cups caffeine daily   Social Determinants of Health   Financial Resource Strain: Medium Risk   Difficulty of Paying Living Expenses: Somewhat hard  Food Insecurity: Food Insecurity Present   Worried About Charity fundraiser in the Last Year: Sometimes true   Ran Out of Food in the Last Year: Sometimes true  Transportation Needs: No Transportation Needs   Lack of Transportation (Medical): No   Lack of Transportation (Non-Medical): No  Physical Activity: Insufficiently Active   Days of Exercise per Week: 7 days   Minutes of Exercise per Session: 20 min  Stress: Stress Concern Present   Feeling of Stress : To some extent  Social Connections: Moderately Isolated   Frequency of Communication with Friends and Family: More than three times a week   Frequency of Social Gatherings with Friends and Family: More than three times a week   Attends Religious Services: More than 4 times per year   Active Member of [employer] or Organizations: No   Attends Archivist Meetings: Never   Marital Status: Widowed   Additional Social History:    Allergies:  No Known Allergies  Labs:  Results for orders placed or performed during the hospital encounter of 04/21/21 (from the past 48 hour(s))  Glucose, capillary     Status: Abnormal   Collection Time: 04/27/21 12:02 PM  Result Value Ref Range   Glucose-Capillary 129 (H) 70 - 99 mg/dL    Comment: Glucose reference range applies only to samples taken after fasting for at least 8 hours.  Basic metabolic panel     Status: Abnormal   Collection Time: 04/28/21  2:42 AM  Result Value Ref Range   Sodium 140 135 -  145 mmol/L   Potassium 4.3 3.5 - 5.1 mmol/L    Comment: SPECIMEN HEMOLYZED. HEMOLYSIS MAY AFFECT INTEGRITY OF RESULTS.  Chloride 113 (H) 98 - 111 mmol/L   CO2 21 (L) 22 - 32 mmol/L   Glucose, Bld 153 (H) 70 - 99 mg/dL    Comment: Glucose reference range applies only to samples taken after fasting for at least 8 hours.   BUN 13 6 - 20 mg/dL   Creatinine, Ser 1.65 (H) 0.44 - 1.00 mg/dL   Calcium 8.6 (L) 8.9 - 10.3 mg/dL   GFR, Estimated 36 (L) >60 mL/min    Comment: (NOTE) Calculated using the CKD-EPI Creatinine Equation (2021)    Anion gap 6 5 - 15    Comment: Performed at [facility] [address] [place], [place] [address]  Glucose, capillary     Status: Abnormal   Collection Time: 04/28/21  6:34 AM  Result Value Ref Range   Glucose-Capillary 151 (H) 70 - 99 mg/dL    Comment: Glucose reference range applies only to samples taken after fasting for at least 8 hours.   Comment 1 Notify RN   Glucose, capillary     Status: Abnormal   Collection Time: 04/28/21 12:04 PM  Result Value Ref Range   Glucose-Capillary 138 (H) 70 - 99 mg/dL    Comment: Glucose reference range applies only to samples taken after fasting for at least 8 hours.  Glucose, capillary     Status: Abnormal   Collection Time: 04/28/21  5:21 PM  Result Value Ref Range   Glucose-Capillary 113 (H) 70 - 99 mg/dL    Comment: Glucose reference range applies only to samples taken after fasting for at least 8 hours.   Comment 1 Notify RN    Comment 2 Document in Chart   Glucose, capillary     Status: Abnormal   Collection Time: 04/28/21  9:57 PM  Result Value Ref Range   Glucose-Capillary 159 (H) 70 - 99 mg/dL    Comment: Glucose reference range applies only to samples taken after fasting for at least 8 hours.   Comment 1 Notify RN   Basic metabolic panel     Status: Abnormal   Collection Time: 04/29/21  2:24 AM  Result Value Ref Range   Sodium 143 135 - 145 mmol/L   Potassium 3.3 (L) 3.5 - 5.1 mmol/L    Chloride 111 98 - 111 mmol/L   CO2 25 22 - 32 mmol/L   Glucose, Bld 153 (H) 70 - 99 mg/dL    Comment: Glucose reference range applies only to samples taken after fasting for at least 8 hours.   BUN 12 6 - 20 mg/dL   Creatinine, Ser 1.50 (H) 0.44 - 1.00 mg/dL   Calcium 8.8 (L) 8.9 - 10.3 mg/dL   GFR, Estimated 41 (L) >60 mL/min    Comment: (NOTE) Calculated using the CKD-EPI Creatinine Equation (2021)    Anion gap 7 5 - 15    Comment: Performed at [facility] [address], [place], [place] [address]  Magnesium     Status: Abnormal   Collection Time: 04/29/21  2:24 AM  Result Value Ref Range   Magnesium 1.6 (L) 1.7 - 2.4 mg/dL    Comment: Performed at [facility] [address], [place], [place] [address]  Hemoglobin and hematocrit, blood     Status: Abnormal   Collection Time: 04/29/21  2:24 AM  Result Value Ref Range   Hemoglobin 7.7 (L) 12.0 - 15.0 g/dL   HCT 26.7 (L) 36.0 - 46.0 %    Comment: Performed at  [facility], [place] [address], [place], [place] [address]  Glucose, capillary     Status: Abnormal   Collection Time: 04/29/21  4:55 AM  Result Value Ref Range   Glucose-Capillary 134 (H) 70 - 99 mg/dL    Comment: Glucose reference range applies only to samples taken after fasting for at least 8 hours.   Comment 1 Notify RN   Glucose, capillary     Status: Abnormal   Collection Time: 04/29/21  7:49 AM  Result Value Ref Range   Glucose-Capillary 129 (H) 70 - 99 mg/dL    Comment: Glucose reference range applies only to samples taken after fasting for at least 8 hours.   *Note: Due to a large number of results and/or encounters for the requested time period, some results have not been displayed. A complete set of results can be found in Results Review.    Current Facility-Administered Medications  Medication Dose Route Frequency Provider Last Rate Last Admin   acetaminophen (TYLENOL) tablet 650 mg  650 mg Oral Q6H PRN Wynetta Emery, Clanford L, MD   650 mg at  04/29/21 0645   Or   acetaminophen (TYLENOL) suppository 650 mg  650 mg Rectal Q6H PRN Johnson, Clanford L, MD       amLODipine (NORVASC) tablet 10 mg  10 mg Oral Daily Johnson, Clanford L, MD   10 mg at 04/28/21 0834   aspirin EC tablet 81 mg  81 mg Oral Daily Dagoberto Ligas, PA-C   81 mg at 04/28/21 6503   doxycycline (VIBRA-TABS) tablet 100 mg  100 mg Oral Q12H Arrien, Jimmy Picket, MD   100 mg at 04/28/21 2202   DULoxetine (CYMBALTA) DR capsule 60 mg  60 mg Oral Daily Johnson, Clanford L, MD   60 mg at 04/28/21 0834   enoxaparin (LOVENOX) injection 75 mg  75 mg Subcutaneous Q12H Dang, Thuy D, [facility]       [START ON 04/30/2021] enoxaparin (LOVENOX) injection 75 mg  75 mg Subcutaneous Q12H Dang, Thuy D, RPH       insulin aspart (novoLOG) injection 0-5 Units  0-5 Units Subcutaneous QHS Johnson, Clanford L, MD       insulin aspart (novoLOG) injection 0-9 Units  0-9 Units Subcutaneous TID WC Johnson, Clanford L, MD   1 Units at 04/28/21 1316   insulin aspart (novoLOG) injection 3 Units  3 Units Subcutaneous TID WC Johnson, Clanford L, MD   3 Units at 04/26/21 1202   levETIRAcetam (KEPPRA) tablet 750 mg  750 mg Oral BID Johnson, Clanford L, MD   750 mg at 04/28/21 2203   lip balm (CARMEX) ointment   Topical PRN Cristal Ford, DO       LORazepam (ATIVAN) tablet 0.5 mg  0.5 mg Oral TID PRN Wynetta Emery, Clanford L, MD   0.5 mg at 04/28/21 1845   metoprolol tartrate (LOPRESSOR) tablet 50 mg  50 mg Oral BID Johnson, Clanford L, MD   50 mg at 04/28/21 2202   morphine 2 MG/ML injection 2 mg  2 mg Intravenous Q4H PRN Cristal Ford, DO   2 mg at 04/25/21 1733   ondansetron (ZOFRAN) tablet 4 mg  4 mg Oral Q6H PRN Johnson, Clanford L, MD       Or   ondansetron (ZOFRAN) injection 4 mg  4 mg Intravenous Q6H PRN Johnson, Clanford L, MD       oxybutynin (DITROPAN-XL) 24 hr tablet 5 mg  5 mg Oral QHS Johnson, Clanford L, MD  5 mg at 04/28/21 2203   oxyCODONE (Oxy IR/ROXICODONE) immediate release tablet 5-10 mg   5-10 mg Oral Q6H PRN Cristal Ford, DO   5 mg at 04/29/21 7412   pantoprazole (PROTONIX) EC tablet 40 mg  40 mg Oral Daily Johnson, Clanford L, MD   40 mg at 04/28/21 8786   rosuvastatin (CRESTOR) tablet 10 mg  10 mg Oral Daily Johnson, Clanford L, MD   10 mg at 04/28/21 7672   traZODone (DESYREL) tablet 50 mg  50 mg Oral QHS Johnson, Clanford L, MD   50 mg at 04/28/21 2202   zinc sulfate capsule 220 mg  220 mg Oral Daily Johnson, Clanford L, MD   220 mg at 04/28/21 0947    Musculoskeletal: Strength & Muscle Tone: decreased with some atrophy in [place]:  remains in bed on exam Patient leans: N/A            Psychiatric Specialty Exam:  Presentation  General Appearance: -- (under multiple blankets and does not really wish to uncover her mouth from the blankets.)  Eye Contact:Minimal  Speech:Normal Rate  Speech Volume:Decreased  Handedness: No data recorded  Mood and Affect  Mood:Irritable  Affect:Blunt   Thought Process  Thought Processes:Linear  Descriptions of Associations:Intact  Orientation:Full (Time, Place and Person)  Thought Content:Logical  History of Schizophrenia/Schizoaffective disorder:No data recorded Duration of Psychotic Symptoms:No data recorded Hallucinations:Hallucinations: Auditory Description of Auditory Hallucinations: "I've been seeing snakes"  Ideas of Reference:None  Suicidal Thoughts:Suicidal Thoughts: No  Homicidal Thoughts:Homicidal Thoughts: No   Sensorium  Memory:Immediate Poor; Recent Poor; Remote Fair  Judgment:Impaired  Insight:Shallow   Executive Functions  Concentration:Fair  Attention Span:Fair  Recall:Poor  Fund of Knowledge:Poor  Language:Poor   Psychomotor Activity  Psychomotor Activity:Psychomotor Activity: Decreased   Assets  Assets:Social Support; Resilience   Sleep  Sleep:Sleep: Poor   Physical Exam: Physical Exam Constitutional:      Appearance: She is not  toxic-appearing.  HENT:     Head: Normocephalic and atraumatic.  Eyes:     Extraocular Movements: Extraocular movements intact.     Conjunctiva/sclera: Conjunctivae normal.  Cardiovascular:     Rate and Rhythm: Normal rate.  Pulmonary:     Effort: Pulmonary effort is normal.     Breath sounds: Normal breath sounds.  Abdominal:     General: Abdomen is flat.  Musculoskeletal:     Comments: BLE seen in primary team's notes, but BUE appear thin and wasting  Skin:    General: Skin is warm.  Neurological:     Mental Status: She is alert and oriented to person, place, and time.   Review of Systems  Constitutional:  Positive for chills. Negative for fever.  HENT:  Negative for hearing loss.   Eyes:  Negative for blurred vision.  Respiratory:  Negative for cough and wheezing.   Cardiovascular:  Negative for chest pain.  Gastrointestinal:  Negative for abdominal pain.  Neurological:  Negative for dizziness.  Psychiatric/Behavioral:  Positive for depression and hallucinations. Negative for suicidal ideas.   Blood pressure 108/86, pulse 83, temperature 98.2 F (36.8 C), temperature source Oral, resp. rate 18, height 5\' 1"  (1.549 m), weight 75.5 kg, last menstrual period 05/19/2016, SpO2 100 %. Body mass index is 31.45 kg/m.  Treatment Plan Summary: Medication management MDD, recurrent, moderate w/ psychosis Anxiety Patient meets criteria for MDD and it appears that her current medication regimen failing and her depression may have worsened to now include psychosis. Patient is endorsing that  she is now having VH. Differential also includes drug induced delirium as patient was discharged on doxycycline 04/04/2021 approx. the time when the [facility] started. Patient does have CKD 3 at baseline which could slow the clearance of this abx. Patient has a hx of acute change in mental status. There is also some concern for MR  at baseline vs cognitive impairment at baseline. Patient reported she did not get  beyond the 10th grade due to pregnancy but patient appeared to only be able to answer very rudimentarily and she is quite young to be as chronically ill as she is. If this is 2/2 to cognitive impairment patient is still quite young but she has a known hx of illicit substance use which could predispose patient.  - Continue Cymbalta 60mg  - Start Seroquel 25mg  QHS - Recommend EKG for QTc  - Continue Trazodone 50mg  QHS Disposition: Patient does not meet criteria for psychiatric inpatient admission.  PGY-2 Freida Busman, MD 04/29/2021 11:20 AM

## 2021-04-29 NOTE — Progress Notes (Signed)
Pharmacy Antibiotic Note  [name] is a 55 y.o. female admitted on 04/21/2021 with bilateral leg pain.  Blood culture also grew Staph capitus.  Patient was on vancomycin, then transitioned to PO doxycycline due to access issues.  Now with new IV and Pharmacy has been consulted to resume vancomycin.  SCr down to 1.5, afebrile, WBC WNL.  Plan: Vanc 1500mg  IV x 1, then 750mg  IV Q24H for AUC 482 using SCr 1.5 Monitor renal fxn, clinical progress, vanc AUC at Css  Height: 5\' 1"  (154.9 cm) Weight: 75.5 kg (166 lb 7.2 oz) IBW/kg (Calculated) : 47.8  Temp (24hrs), Avg:98.4 F (36.9 C), Min:98.2 F (36.8 C), Max:98.5 F (36.9 C)  Recent Labs  Lab 04/25/21 0818 04/25/21 2121 04/26/21 1409 04/27/21 0339 04/28/21 0242 04/29/21 0224  WBC  --  9.8  --  9.5  --   --   CREATININE 1.64*  --  1.65* 1.69* 1.65* 1.50*    Estimated Creatinine Clearance: 38.9 mL/min (A) (by C-G formula based on SCr of 1.5 mg/dL (H)).    No Known Allergies  Vanc 6/27 >> 7/1, restart 7/5 >>  Cefepime 6/27 >> 6/30 Doxy 7/4 >> 7/5 (loss IV access)   6/27 BCx - 2/4 staph capitis from two dif bottles possible same location (ox resistant) 7/1 BCx - NGTD  Amee Boothe D. Mina Marble, PharmD, BCPS, [place] 04/29/2021, 2:51 PM

## 2021-04-29 NOTE — Plan of Care (Signed)

## 2021-04-29 NOTE — Progress Notes (Signed)
ANTICOAGULATION & ANTIBIOTIC CONSULT NOTE  Pharmacy Consult:  Lovenox Indication:  PVD  No Known Allergies  Patient Measurements: Height: 5\' 1"  (154.9 cm) Weight: 75.5 kg (166 lb 7.2 oz) IBW/kg (Calculated) : 47.8  Vital Signs: Temp: 98.2 F (36.8 C) (07/05 0442) Temp Source: Oral (07/05 0442) BP: 108/86 (07/05 0442) Pulse Rate: 83 (07/05 0442)  Labs: Recent Labs    04/27/21 0339 04/28/21 0242 04/29/21 0224  HGB 7.6*  --  7.7*  HCT 25.7*  --  26.7*  PLT 297  --   --   CREATININE 1.69* 1.65* 1.50*     Estimated Creatinine Clearance: 38.9 mL/min (A) (by C-G formula based on SCr of 1.5 mg/dL (H)).   Assessment: 39 yoF admitted with septic gangrene. Patient has history of AFib on apixaban PTA which was held for procedures. Patient was transitioned to IV heparin and then to Lovenox.  Renal function improving, hemoglobin/hematocrit low and stable, platelet count WNL.  No bleeding documented.    Goal of Therapy:  Anti-Xa level 0.6-1 units/ml 4hrs after LMWH dose given Monitor platelets by anticoagulation protocol: Yes   Plan:  Continue Lovenox 75mg  SQ Q12H Monitor renal fxn, s/sx of bleeding Hold 7/6 AM dose for arteriogram with SFA intervention Patient refusing Lovenox level  Corbett Moulder D. Mina Marble, PharmD, BCPS, [place] 04/29/2021, 10:52 AM

## 2021-04-29 NOTE — Progress Notes (Signed)
   VASCULAR SURGERY ASSESSMENT & PLAN:   PVD WITH GANGRENE BOTH LOWER EXTREMITIES: The patient is undergone right lower extremity endovascular revascularization.  She is scheduled for left lower extremity arteriography tomorrow with SFA intervention.  I have discussed the indications for the procedure and the potential complications with the patient and she is agreeable to proceed.  I have written preop orders.   SUBJECTIVE:   No specific complaints.  PHYSICAL EXAM:   Vitals:   04/28/21 1725 04/28/21 2134 04/29/21 0442 04/29/21 0445  BP: 118/86 98/69 108/86   Pulse: 88 92 83   Resp: 16 16 18    Temp: 98.4 F (36.9 C) 98.5 F (36.9 C) 98.2 F (36.8 C)   TempSrc: Oral Oral Oral   SpO2:  97% 100%   Weight:    75.5 kg  Height:       No change to the wounds on both legs. The right foot is warm and well-perfused. The left foot is slightly cooler than the right.  LABS:   Lab Results  Component Value Date   WBC 9.5 04/27/2021   HGB 7.7 (L) 04/29/2021   HCT 26.7 (L) 04/29/2021   MCV 95.5 04/27/2021   PLT 297 04/27/2021   Lab Results  Component Value Date   CREATININE 1.50 (H) 04/29/2021   Lab Results  Component Value Date   INR 1.7 (H) 04/21/2021   CBG (last 3)  Recent Labs    04/28/21 2157 04/29/21 0455 04/29/21 0749  GLUCAP 159* 134* 129*    PROBLEM LIST:    Principal Problem:   Sepsis ([place]) Active Problems:   Essential hypertension   INSOMNIA   Medical non-compliance   Poor mobility   Lower extremity weakness   HTN (hypertension), malignant   Hypokalemia   Diabetic neuropathy (HCC)   CKD (chronic kidney disease) stage 3, GFR 30-59 ml/min (HCC)   Severe recurrent major depression without psychotic features (HCC)   COPD (chronic obstructive pulmonary disease) (HCC)   At high risk for falls   PAF (paroxysmal atrial fibrillation) (HCC)   Atrial fibrillation with RVR (HCC)   Seizures (HCC)   Sacral ulcer, limited to breakdown of skin (HCC)   Reflux  esophagitis   Multiple duodenal ulcers   Chronic respiratory failure with hypoxia (HCC)   Hypomagnesemia   PVD (peripheral vascular disease) (HCC)   Atherosclerosis of native artery of both legs with gangrene (HCC)   MDD (major depressive disorder), recurrent episode, moderate (HCC)   CURRENT MEDS:    amLODipine  10 mg Oral Daily   aspirin EC  81 mg Oral Daily   doxycycline  100 mg Oral Q12H   DULoxetine  60 mg Oral Daily   enoxaparin (LOVENOX) injection  75 mg Subcutaneous Q12H   insulin aspart  0-5 Units Subcutaneous QHS   insulin aspart  0-9 Units Subcutaneous TID WC   insulin aspart  3 Units Subcutaneous TID WC   levETIRAcetam  750 mg Oral BID   metoprolol tartrate  50 mg Oral BID   oxybutynin  5 mg Oral QHS   pantoprazole  40 mg Oral Daily   rosuvastatin  10 mg Oral Daily   traZODone  50 mg Oral QHS   zinc sulfate  220 mg Oral Daily    Deitra Mayo Office: [phone] 04/29/2021

## 2021-04-29 NOTE — Progress Notes (Signed)
PROGRESS NOTE    [name]  TKW:[id number] DOB: Jun 29, 1966 DOA: 04/21/2021 PCP: Caprice Renshaw, MD    Brief Narrative:  [name] was admitted with the working diagnosis of sepsis due to bilateral lower extremity leg ulcerations with cellulitis and dry gangrene. Staphylococcus cutis bacteremia.    55 year old female past medical history for tobacco abuse, severe peripheral vascular disease, paroxysmal atrial fibrillation, hypertension, dyslipidemia, type 2 diabetes mellitus, diastolic heart failure, chronic kidney disease who presented with bilateral leg pain.  She was noted to have decreased p.o. intake at her nursing facility.  She does have chronic ulcerated lesions bilateral legs.  On her initial physical examination temperature 98.1, heart rate 150, respiratory rate 22, heart rate 90, blood pressure 116/92, oxygen saturation 93%, her lungs were clear to auscultation bilaterally, heart S1-S2, present, rhythmic, soft abdomen, positive pressure ulcers ventral side of distal lower extremities.  Stage IV pressure ulcers with necrotic skin.    Patient was placed on broad-spectrum antibiotic therapy with vancomycin and cefepime. Vascular surgery and orthopedic were consulted.   Patient was scheduled to have left common femoral artery access aortogram and right lower extremity angiography but patient declined to proceed on the Cath Lab table. This was discussed with the sister, and procedure was reattempted successfully under general anesthesia.    06/29 Sp right superficial femoral and popliteal artery stenting   Positive blood cultures for staph cutis  #2/2    Patient with poor IV acces, has declined PICC line several times.    Poor oral intake and continue to decline IV, change antibiotic therapy to oral doxycycline.  07/05 has new IV and Vancomycin resumed. Plan for left leg angioplasty on 07/06.     Assessment & Plan:   Principal Problem:   Sepsis ([place]) Active Problems:    Essential hypertension   INSOMNIA   Medical non-compliance   Poor mobility   Lower extremity weakness   HTN (hypertension), malignant   Hypokalemia   Diabetic neuropathy (HCC)   CKD (chronic kidney disease) stage 3, GFR 30-59 ml/min (HCC)   Severe recurrent major depression without psychotic features (HCC)   COPD (chronic obstructive pulmonary disease) (HCC)   At high risk for falls   PAF (paroxysmal atrial fibrillation) (HCC)   Atrial fibrillation with RVR (HCC)   Seizures (HCC)   Sacral ulcer, limited to breakdown of skin (HCC)   Reflux esophagitis   Multiple duodenal ulcers   Chronic respiratory failure with hypoxia (HCC)   Hypomagnesemia   PVD (peripheral vascular disease) (HCC)   Atherosclerosis of native artery of both legs with gangrene (HCC)   MDD (major depressive disorder), recurrent episode, moderate (HCC)    Sepsis due to bilateral lower extremity cellulitis with dry gangrene, in the setting of severe PVD. (Present on admission)/ Staphylococcus bacteremia. Blood cultures #2/2 positive for staphylococcus cutis Sp right superficial femoral and popliteal artery stenting.   Follow blood cultures continue with no growth. Staphylococcus cutis sensitive to vancomycin resistant to cipro and erythromycin. Echocardiogram with no evident vegetation.  Patient had 2 days of oral antibiotic therapy due to no IV access. Now that patient has a functioning IV access will resume vancomycin.  Plan for left leg revascularization tomorrow, then will get final recommendations from orthopedics. Depending of this recommendations will plan duration of antibiotic therapy.    2. T2DM. Patient with poor oral intake, her fasting glucose today is 153. Continue insulin sliding scale for glucose cover and monitoring.   3. Chronic hypoxemic respiratory failure. Continue  with supplemental 02 per [employer].     4. Paroxysmal atrial fibrillation. Continue with metoprolol for rate control and  apixaban for  anticoagulation    5., HTN / Dyslipidemia.  On amlodipine and metoprolol for blood pressure control.  On statin    6. AKI on CKD stage 3b/ hypokalemia, non anion gap metabolic acidosis.  Renal function has been stable with serum cr at 1,50, patient with poor oral intake. K is 3,3 and serum bicarbonate at 25 with Mg at 1,6  Plan for K correction with 20 meq Kcl and Mg correction with 2 g. Follow renal panel in am.    7. Depression.  patient with severe mood swings, declining medical treatment at times.   Consulted psychiatry for recommendations:   Plan to continue with lorazepam, duloxetine, trazodone, keppra. Add quetiapine.     8. Chronic anemia, chronic disease. her Hgb is 7,7 today with hct at 26,7 Continue close follow up on hgb and hct. No clinical signs of active bleeding    Status is: Inpatient  Remains inpatient appropriate because:IV treatments appropriate due to intensity of illness or inability to take PO  Dispo: The patient is from: SNF              Anticipated d/c is to: SNF              Patient currently is not medically stable to d/c.   Difficult to place patient No   DVT prophylaxis: Enoxaparin   Code Status:   full  Family Communication:  I spoke over the phone at the bedside with the patient's son about patient's  condition, plan of care, prognosis and all questions were addressed.      Skin Documentation: Pressure Injury 12/03/20 Perineum Posterior Stage 1 -  Intact skin with non-blanchable redness of a localized area usually over a bony prominence. (Active)  12/03/20 2210  Location: Perineum  Location Orientation: Posterior  Staging: Stage 1 -  Intact skin with non-blanchable redness of a localized area usually over a bony prominence.  Wound Description (Comments):   Present on Admission: Yes     Pressure Injury 12/04/20 Coccyx Right;Medial Stage 2 -  Partial thickness loss of dermis presenting as a shallow open injury with a red, pink  wound bed without slough. 1cm x 0.5cm area to the right of previously healed pressure ulcer (Active)  12/04/20 0800  Location: Coccyx  Location Orientation: Right;Medial  Staging: Stage 2 -  Partial thickness loss of dermis presenting as a shallow open injury with a red, pink wound bed without slough.  Wound Description (Comments): 1cm x 0.5cm area to the right of previously healed pressure ulcer  Present on Admission: Yes     Pressure Injury 03/12/21 Sacrum Mid Stage 2 -  Partial thickness loss of dermis presenting as a shallow open injury with a red, pink wound bed without slough. (Active)  03/12/21 1128  Location: Sacrum  Location Orientation: Mid  Staging: Stage 2 -  Partial thickness loss of dermis presenting as a shallow open injury with a red, pink wound bed without slough.  Wound Description (Comments):   Present on Admission: Yes     Pressure Injury 03/12/21 Thigh Right;Upper Stage 2 -  Partial thickness loss of dermis presenting as a shallow open injury with a red, pink wound bed without slough. (Active)  03/12/21 1131  Location: Thigh  Location Orientation: Right;Upper  Staging: Stage 2 -  Partial thickness loss of dermis presenting as a shallow  open injury with a red, pink wound bed without slough.  Wound Description (Comments):   Present on Admission: Yes     Pressure Injury 03/23/21 Pretibial Distal;Left Stage 3 -  Full thickness tissue loss. Subcutaneous fat may be visible but bone, tendon or muscle are NOT exposed. 7 cm X 4.5 cm no tunneling (Active)  03/23/21 1344  Location: Pretibial  Location Orientation: Distal;Left  Staging: Stage 3 -  Full thickness tissue loss. Subcutaneous fat may be visible but bone, tendon or muscle are NOT exposed.  Wound Description (Comments): 7 cm X 4.5 cm no tunneling  Present on Admission: Yes     Pressure Injury 03/23/21 Tibial Left;Posterior;Proximal Stage 3 -  Full thickness tissue loss. Subcutaneous fat may be visible but bone,  tendon or muscle are NOT exposed. 1 cm X 1 Cm with yellow sloth present (Active)  03/23/21 1357  Location: Tibial  Location Orientation: Left;Posterior;Proximal  Staging: Stage 3 -  Full thickness tissue loss. Subcutaneous fat may be visible but bone, tendon or muscle are NOT exposed.  Wound Description (Comments): 1 cm X 1 Cm with yellow sloth present  Present on Admission: Yes     Pressure Injury 03/23/21 Thigh Anterior;Left Stage 2 -  Partial thickness loss of dermis presenting as a shallow open injury with a red, pink wound bed without slough. 2 cm X 0.5 cm pink wound bed (Active)  03/23/21 1358  Location: Thigh  Location Orientation: Anterior;Left  Staging: Stage 2 -  Partial thickness loss of dermis presenting as a shallow open injury with a red, pink wound bed without slough.  Wound Description (Comments): 2 cm X 0.5 cm pink wound bed  Present on Admission:      Pressure Injury 03/23/21 Thigh Anterior;Right Stage 2 -  Partial thickness loss of dermis presenting as a shallow open injury with a red, pink wound bed without slough. 2 cm x 1 cm pink wound bed (Active)  03/23/21 1400  Location: Thigh  Location Orientation: Anterior;Right  Staging: Stage 2 -  Partial thickness loss of dermis presenting as a shallow open injury with a red, pink wound bed without slough.  Wound Description (Comments): 2 cm x 1 cm pink wound bed  Present on Admission: Yes     Pressure Injury 03/23/21 Buttocks Right;Posterior Stage 2 -  Partial thickness loss of dermis presenting as a shallow open injury with a red, pink wound bed without slough. 0.5 cm x 0.5 cm pink wound bed (Active)  03/23/21 1402  Location: Buttocks  Location Orientation: Right;Posterior  Staging: Stage 2 -  Partial thickness loss of dermis presenting as a shallow open injury with a red, pink wound bed without slough.  Wound Description (Comments): 0.5 cm x 0.5 cm pink wound bed  Present on Admission: Yes     Pressure Injury 03/23/21  Buttocks Right Stage 2 -  Partial thickness loss of dermis presenting as a shallow open injury with a red, pink wound bed without slough. 3 cm x 1 cm pink wound bed (Active)  03/23/21 1404  Location: Buttocks  Location Orientation: Right  Staging: Stage 2 -  Partial thickness loss of dermis presenting as a shallow open injury with a red, pink wound bed without slough.  Wound Description (Comments): 3 cm x 1 cm pink wound bed  Present on Admission: Yes      Consultants:  Vascular surgery Orthopedics    Procedures:  Right leg revascularization    Antimicrobials:  Vancomycin  Subjective: Patient with no nausea or vomiting, continue to have bilateral lower extremity pain. Poor oral intake   Objective: Vitals:   04/28/21 1725 04/28/21 2134 04/29/21 0442 04/29/21 0445  BP: 118/86 98/69 108/86   Pulse: 88 92 83   Resp: 16 16 18    Temp: 98.4 F (36.9 C) 98.5 F (36.9 C) 98.2 F (36.8 C)   TempSrc: Oral Oral Oral   SpO2:  97% 100%   Weight:    75.5 kg  Height:        Intake/Output Summary (Last 24 hours) at 04/29/2021 1433 Last data filed at 04/28/2021 2138 Gross per 24 hour  Intake 400 ml  Output 700 ml  Net -300 ml   Filed Weights   04/27/21 0436 04/28/21 0642 04/29/21 0445  Weight: 77 kg 76.7 kg 75.5 kg    Examination:   General: Not in pain or dyspnea, deconditioned  Neurology: Awake and alert, non focal  E ENT: mild pallor, no icterus, oral mucosa moist Cardiovascular: No JVD. S1-S2 present, rhythmic, no gallops, rubs, or murmurs. No lower extremity edema. Pulmonary: positive breath sounds bilaterally, adequate air movement, no wheezing, rhonchi or rales. Gastrointestinal. Abdomen soft and non tender Skin. Bilateral lower extremities ulcerated wounds with necrotic tissue, no erythema or purulence.  Musculoskeletal: bilateral ankle deformities.       Data Reviewed: I have personally reviewed following labs and imaging studies  CBC: Recent Labs  Lab  04/25/21 2121 04/27/21 0339 04/29/21 0224  WBC 9.8 9.5  --   NEUTROABS 6.9 6.4  --   HGB 8.4* 7.6* 7.7*  HCT 28.4* 25.7* 26.7*  MCV 95.3 95.5  --   PLT 296 297  --    Basic Metabolic Panel: Recent Labs  Lab 04/23/21 0637 04/25/21 0818 04/26/21 1409 04/27/21 0339 04/28/21 0242 04/29/21 0224  NA 138 144 142 143 140 143  K 4.0 3.5 3.3* 3.1* 4.3 3.3*  CL 108 112* 114* 113* 113* 111  CO2 17* 19* 21* 22 21* 25  GLUCOSE 157* 126* 171* 129* 153* 153*  BUN 14 14 14 13 13 12   CREATININE 1.28* 1.64* 1.65* 1.69* 1.65* 1.50*  CALCIUM 8.7* 8.9 8.7* 8.8* 8.6* 8.8*  MG 2.1 1.8  --   --   --  1.6*  PHOS 2.9  --   --   --   --   --    GFR: Estimated Creatinine Clearance: 38.9 mL/min (A) (by C-G formula based on SCr of 1.5 mg/dL (H)). Liver Function Tests: No results for input(s): AST, ALT, ALKPHOS, BILITOT, PROT, ALBUMIN in the last 168 hours. No results for input(s): LIPASE, AMYLASE in the last 168 hours. No results for input(s): AMMONIA in the last 168 hours. Coagulation Profile: No results for input(s): INR, PROTIME in the last 168 hours. Cardiac Enzymes: No results for input(s): CKTOTAL, CKMB, CKMBINDEX, TROPONINI in the last 168 hours. BNP (last 3 results) No results for input(s): PROBNP in the last 8760 hours. HbA1C: No results for input(s): HGBA1C in the last 72 hours. CBG: Recent Labs  Lab 04/28/21 1721 04/28/21 2157 04/29/21 0455 04/29/21 0749 04/29/21 1130  GLUCAP 113* 159* 134* 129* 130*   Lipid Profile: No results for input(s): CHOL, HDL, LDLCALC, TRIG, CHOLHDL, LDLDIRECT in the last 72 hours. Thyroid Function Tests: No results for input(s): TSH, T4TOTAL, FREET4, T3FREE, THYROIDAB in the last 72 hours. Anemia Panel: No results for input(s): VITAMINB12, FOLATE, FERRITIN, TIBC, IRON, RETICCTPCT in the last 72 hours.    Radiology Studies:  I have reviewed all of the imaging during this hospital visit personally     Scheduled Meds:  amLODipine  10 mg Oral  Daily   aspirin EC  81 mg Oral Daily   DULoxetine  60 mg Oral Daily   enoxaparin (LOVENOX) injection  75 mg Subcutaneous Q12H   [START ON 04/30/2021] enoxaparin (LOVENOX) injection  75 mg Subcutaneous Q12H   insulin aspart  0-5 Units Subcutaneous QHS   insulin aspart  0-9 Units Subcutaneous TID WC   insulin aspart  3 Units Subcutaneous TID WC   levETIRAcetam  750 mg Oral BID   metoprolol tartrate  50 mg Oral BID   oxybutynin  5 mg Oral QHS   pantoprazole  40 mg Oral Daily   potassium chloride  20 mEq Oral Once   QUEtiapine  25 mg Oral QHS   rosuvastatin  10 mg Oral Daily   traZODone  50 mg Oral QHS   zinc sulfate  220 mg Oral Daily   Continuous Infusions:  magnesium sulfate bolus IVPB       LOS: 8 days        Nona Gracey Gerome Apley, MD

## 2021-04-30 ENCOUNTER — Encounter (HOSPITAL_COMMUNITY)
Admission: EM | Disposition: A | Discharge status: Skilled Nursing Facility | Payer: Self-pay | Source: Skilled Nursing Facility | Attending: Internal Medicine

## 2021-04-30 ENCOUNTER — Encounter (HOSPITAL_COMMUNITY): Payer: Self-pay | Admitting: Family Medicine

## 2021-04-30 ENCOUNTER — Inpatient Hospital Stay (HOSPITAL_COMMUNITY): Payer: Medicare Other | Admitting: Anesthesiology

## 2021-04-30 ENCOUNTER — Inpatient Hospital Stay (HOSPITAL_COMMUNITY): Payer: Medicare Other

## 2021-04-30 DIAGNOSIS — A419 Sepsis, unspecified organism: Secondary | ICD-10-CM | POA: Diagnosis not present

## 2021-04-30 DIAGNOSIS — I70245 Atherosclerosis of native arteries of left leg with ulceration of other part of foot: Secondary | ICD-10-CM

## 2021-04-30 DIAGNOSIS — F331 Major depressive disorder, recurrent, moderate: Secondary | ICD-10-CM | POA: Diagnosis not present

## 2021-04-30 DIAGNOSIS — Z9119 Patient's noncompliance with other medical treatment and regimen: Secondary | ICD-10-CM

## 2021-04-30 DIAGNOSIS — F5105 Insomnia due to other mental disorder: Secondary | ICD-10-CM | POA: Diagnosis not present

## 2021-04-30 DIAGNOSIS — I70263 Atherosclerosis of native arteries of extremities with gangrene, bilateral legs: Secondary | ICD-10-CM | POA: Diagnosis not present

## 2021-04-30 DIAGNOSIS — J9611 Chronic respiratory failure with hypoxia: Secondary | ICD-10-CM | POA: Diagnosis not present

## 2021-04-30 DIAGNOSIS — F99 Mental disorder, not otherwise specified: Secondary | ICD-10-CM | POA: Diagnosis not present

## 2021-04-30 DIAGNOSIS — I4891 Unspecified atrial fibrillation: Secondary | ICD-10-CM | POA: Diagnosis not present

## 2021-04-30 DIAGNOSIS — E0842 Diabetes mellitus due to underlying condition with diabetic polyneuropathy: Secondary | ICD-10-CM

## 2021-04-30 DIAGNOSIS — F332 Major depressive disorder, recurrent severe without psychotic features: Secondary | ICD-10-CM

## 2021-04-30 HISTORY — PX: AORTOGRAM: SHX6300

## 2021-04-30 LAB — CBC WITH DIFFERENTIAL/PLATELET
Abs Immature Granulocytes: 0.07 10*3/uL (ref 0.00–0.07)
Basophils Absolute: 0.1 10*3/uL (ref 0.0–0.1)
Basophils Relative: 1 %
Eosinophils Absolute: 0.3 10*3/uL (ref 0.0–0.5)
Eosinophils Relative: 3 %
HCT: 29.1 % — ABNORMAL LOW (ref 36.0–46.0)
Hemoglobin: 8.7 g/dL — ABNORMAL LOW (ref 12.0–15.0)
Immature Granulocytes: 1 %
Lymphocytes Relative: 19 %
Lymphs Abs: 1.9 10*3/uL (ref 0.7–4.0)
MCH: 28.2 pg (ref 26.0–34.0)
MCHC: 29.9 g/dL — ABNORMAL LOW (ref 30.0–36.0)
MCV: 94.5 fL (ref 80.0–100.0)
Monocytes Absolute: 0.7 10*3/uL (ref 0.1–1.0)
Monocytes Relative: 7 %
Neutro Abs: 7.1 10*3/uL (ref 1.7–7.7)
Neutrophils Relative %: 69 %
Platelets: 261 10*3/uL (ref 150–400)
RBC: 3.08 MIL/uL — ABNORMAL LOW (ref 3.87–5.11)
RDW: 17.2 % — ABNORMAL HIGH (ref 11.5–15.5)
WBC: 10.1 10*3/uL (ref 4.0–10.5)
nRBC: 0.4 % — ABNORMAL HIGH (ref 0.0–0.2)

## 2021-04-30 LAB — BASIC METABOLIC PANEL
Anion gap: 7 (ref 5–15)
BUN: 10 mg/dL (ref 6–20)
CO2: 23 mmol/L (ref 22–32)
Calcium: 8.7 mg/dL — ABNORMAL LOW (ref 8.9–10.3)
Chloride: 113 mmol/L — ABNORMAL HIGH (ref 98–111)
Creatinine, Ser: 1.48 mg/dL — ABNORMAL HIGH (ref 0.44–1.00)
GFR, Estimated: 41 mL/min — ABNORMAL LOW (ref >60)
Glucose, Bld: 146 mg/dL — ABNORMAL HIGH (ref 70–99)
Potassium: 3.1 mmol/L — ABNORMAL LOW (ref 3.5–5.1)
Sodium: 143 mmol/L (ref 135–145)

## 2021-04-30 LAB — GLUCOSE, CAPILLARY
Glucose-Capillary: 142 mg/dL — ABNORMAL HIGH (ref 70–99)
Glucose-Capillary: 129 mg/dL — ABNORMAL HIGH (ref 70–99)
Glucose-Capillary: 108 mg/dL — ABNORMAL HIGH (ref 70–99)
Glucose-Capillary: 119 mg/dL — ABNORMAL HIGH (ref 70–99)
Glucose-Capillary: 92 mg/dL (ref 70–99)
Glucose-Capillary: 105 mg/dL — ABNORMAL HIGH (ref 70–99)
Glucose-Capillary: 115 mg/dL — ABNORMAL HIGH (ref 70–99)

## 2021-04-30 LAB — CULTURE, BLOOD (ROUTINE X 2)
Culture: NO GROWTH
Culture: NO GROWTH

## 2021-04-30 LAB — MAGNESIUM: Magnesium: 2.1 mg/dL (ref 1.7–2.4)

## 2021-04-30 SURGERY — ABDOMINAL AORTOGRAM W/LOWER EXTREMITY
Anesthesia: LOCAL

## 2021-04-30 SURGERY — AORTOGRAM
Anesthesia: Monitor Anesthesia Care | Site: Groin | Laterality: Left

## 2021-04-30 MED ORDER — PROPOFOL 500 MG/50ML IV EMUL
INTRAVENOUS | Status: DC | PRN
  Administered 2021-04-30: 50 ug/kg/min via INTRAVENOUS

## 2021-04-30 MED ORDER — PROMETHAZINE HCL 25 MG/ML IJ SOLN: 6.2500 mg | INTRAMUSCULAR | Status: DC | PRN

## 2021-04-30 MED ORDER — HEPARIN SODIUM (PORCINE) 1000 UNIT/ML IJ SOLN
INTRAMUSCULAR | Status: AC
  Filled 2021-04-30: qty 1

## 2021-04-30 MED ORDER — POTASSIUM CHLORIDE 10 MEQ/100ML IV SOLN
10.0000 meq | INTRAVENOUS | Status: AC
  Administered 2021-04-30 (×3): 10 meq via INTRAVENOUS
  Filled 2021-04-30 (×2): qty 100

## 2021-04-30 MED ORDER — HEPARIN 6000 UNIT IRRIGATION SOLUTION
Status: DC | PRN
  Administered 2021-04-30: 1

## 2021-04-30 MED ORDER — MEPERIDINE HCL 25 MG/ML IJ SOLN: 6.2500 mg | INTRAMUSCULAR | Status: DC | PRN

## 2021-04-30 MED ORDER — CHLORHEXIDINE GLUCONATE 0.12 % MT SOLN
15.0000 mL | OROMUCOSAL | Status: AC
  Filled 2021-04-30: qty 15

## 2021-04-30 MED ORDER — IODIXANOL 320 MG/ML IV SOLN
INTRAVENOUS | Status: DC | PRN
  Administered 2021-04-30: 62.4 mL

## 2021-04-30 MED ORDER — FENTANYL CITRATE (PF) 250 MCG/5ML IJ SOLN
INTRAMUSCULAR | Status: AC
  Filled 2021-04-30: qty 5

## 2021-04-30 MED ORDER — PROPOFOL 10 MG/ML IV BOLUS
INTRAVENOUS | Status: AC
  Filled 2021-04-30: qty 20

## 2021-04-30 MED ORDER — HYDROMORPHONE HCL 1 MG/ML IJ SOLN: 0.2500 mg | INTRAMUSCULAR | Status: DC | PRN

## 2021-04-30 MED ORDER — ONDANSETRON HCL 4 MG/2ML IJ SOLN
INTRAMUSCULAR | Status: AC
  Filled 2021-04-30: qty 2

## 2021-04-30 MED ORDER — POTASSIUM CHLORIDE 10 MEQ/100ML IV SOLN
10.0000 meq | INTRAVENOUS | Status: AC
  Administered 2021-04-30 (×3): 10 meq via INTRAVENOUS
  Filled 2021-04-30 (×4): qty 100

## 2021-04-30 MED ORDER — MIDAZOLAM HCL 2 MG/2ML IJ SOLN
0.5000 mg | Freq: Once | INTRAMUSCULAR | Status: DC | PRN
Start: 2021-04-30 — End: 2021-04-30

## 2021-04-30 MED ORDER — PHENYLEPHRINE HCL-NACL 10-0.9 MG/250ML-% IV SOLN
INTRAVENOUS | Status: DC | PRN
  Administered 2021-04-30: 30 ug/min via INTRAVENOUS

## 2021-04-30 MED ORDER — CHLORHEXIDINE GLUCONATE 0.12 % MT SOLN
OROMUCOSAL | Status: AC
  Administered 2021-04-30: 15 mL via OROMUCOSAL
  Filled 2021-04-30: qty 15

## 2021-04-30 MED ORDER — OXYCODONE HCL 5 MG PO TABS: 5.0000 mg | ORAL_TABLET | Freq: Once | ORAL | Status: DC | PRN

## 2021-04-30 MED ORDER — LIDOCAINE HCL (PF) 1 % IJ SOLN
INTRAMUSCULAR | Status: AC
  Filled 2021-04-30: qty 30

## 2021-04-30 MED ORDER — DEXAMETHASONE SODIUM PHOSPHATE 10 MG/ML IJ SOLN
INTRAMUSCULAR | Status: AC
  Filled 2021-04-30: qty 1

## 2021-04-30 MED ORDER — PHENYLEPHRINE 40 MCG/ML (10ML) SYRINGE FOR IV PUSH (FOR BLOOD PRESSURE SUPPORT)
PREFILLED_SYRINGE | INTRAVENOUS | Status: DC | PRN
  Administered 2021-04-30 (×2): 160 ug via INTRAVENOUS

## 2021-04-30 MED ORDER — MIDAZOLAM HCL 2 MG/2ML IJ SOLN
INTRAMUSCULAR | Status: AC
  Filled 2021-04-30: qty 2

## 2021-04-30 MED ORDER — HEPARIN SODIUM (PORCINE) 1000 UNIT/ML IJ SOLN
INTRAMUSCULAR | Status: DC | PRN
  Administered 2021-04-30: 8000 [IU] via INTRAVENOUS

## 2021-04-30 MED ORDER — OXYCODONE HCL 5 MG/5ML PO SOLN: 5.0000 mg | Freq: Once | ORAL | Status: DC | PRN

## 2021-04-30 MED ORDER — LIDOCAINE HCL (PF) 1 % IJ SOLN
INTRAMUSCULAR | Status: DC | PRN
  Administered 2021-04-30: 6 mL

## 2021-04-30 MED ORDER — ROCURONIUM BROMIDE 10 MG/ML (PF) SYRINGE
PREFILLED_SYRINGE | INTRAVENOUS | Status: AC
  Filled 2021-04-30: qty 10

## 2021-04-30 MED ORDER — LACTATED RINGERS IV SOLN: INTRAVENOUS | Status: DC

## 2021-04-30 MED ORDER — FENTANYL CITRATE (PF) 250 MCG/5ML IJ SOLN
INTRAMUSCULAR | Status: DC | PRN
  Administered 2021-04-30: 50 ug via INTRAVENOUS

## 2021-04-30 SURGICAL SUPPLY — 41 items
APL PRP STRL LF DISP 70% ISPRP (MISCELLANEOUS) ×1
BAG COUNTER SPONGE SURGICOUNT (BAG) ×2 IMPLANT
BAG SPNG CNTER NS LX DISP (BAG) ×1
CANISTER SUCT 3000ML PPV (MISCELLANEOUS) ×2 IMPLANT
CATH OMNI FLUSH 5F 65CM (CATHETERS) ×3 IMPLANT
CHLORAPREP W/TINT 26 (MISCELLANEOUS) ×2 IMPLANT
COVER SURGICAL LIGHT HANDLE (MISCELLANEOUS) ×2 IMPLANT
DEVICE CLOSURE MYNXGRIP 5F (Vascular Products) ×1 IMPLANT
DEVICE TORQUE H2O (MISCELLANEOUS) ×1 IMPLANT
DEVICE TORQUE KENDALL .025-038 (MISCELLANEOUS) IMPLANT
DRSG TEGADERM 4X4.75 (GAUZE/BANDAGES/DRESSINGS) ×1 IMPLANT
FILTER CO2 0.2 MICRON (VASCULAR PRODUCTS) IMPLANT
FILTER CO2 INSUFFLATOR AX1008 (MISCELLANEOUS) IMPLANT
GAUZE 4X4 16PLY ~~LOC~~+RFID DBL (SPONGE) ×1 IMPLANT
GAUZE SPONGE 2X2 8PLY STRL LF (GAUZE/BANDAGES/DRESSINGS) IMPLANT
GLIDEWIRE ADV .035X180CM (WIRE) ×1 IMPLANT
GLOVE SURG ENC MOIS LTX SZ7.5 (GLOVE) ×2 IMPLANT
GLOVE SURG UNDER LTX SZ8 (GLOVE) ×2 IMPLANT
GOWN STRL REUS W/ TWL XL LVL3 (GOWN DISPOSABLE) ×1 IMPLANT
GOWN STRL REUS W/TWL XL LVL3 (GOWN DISPOSABLE) ×2
GUIDEWIRE ANGLED .035X150CM (WIRE) IMPLANT
KIT BASIN OR (CUSTOM PROCEDURE TRAY) ×2 IMPLANT
KIT MICROPUNCTURE NIT STIFF (SHEATH) ×1 IMPLANT
KIT TURNOVER KIT B (KITS) ×2 IMPLANT
NDL PERC 18GX7CM (NEEDLE) ×1 IMPLANT
NEEDLE PERC 18GX7CM (NEEDLE) ×2 IMPLANT
NS IRRIG 1000ML POUR BTL (IV SOLUTION) ×2 IMPLANT
PACK ENDO MINOR (CUSTOM PROCEDURE TRAY) ×1 IMPLANT
PAD ARMBOARD 7.5X6 YLW CONV (MISCELLANEOUS) ×4 IMPLANT
SET FLUSH CO2 (MISCELLANEOUS) IMPLANT
SET MICROPUNCTURE 5F STIFF (MISCELLANEOUS) ×1 IMPLANT
SHEATH PINNACLE 5F 10CM (SHEATH) ×1 IMPLANT
SPONGE GAUZE 2X2 STER 10/PKG (GAUZE/BANDAGES/DRESSINGS) ×1
STOPCOCK MORSE 400PSI 3WAY (MISCELLANEOUS) ×1 IMPLANT
SYR MEDRAD MARK V 150ML (SYRINGE) ×1 IMPLANT
TOWEL GREEN STERILE (TOWEL DISPOSABLE) ×4 IMPLANT
TUBING HIGH PRESSURE 120CM (CONNECTOR) IMPLANT
TUBING INJECTOR 48 (MISCELLANEOUS) ×1 IMPLANT
WATER STERILE IRR 1000ML POUR (IV SOLUTION) IMPLANT
WIRE BENTSON .035X145CM (WIRE) ×2 IMPLANT
WIRE STARTER BENTSON 035X150 (WIRE) ×1 IMPLANT

## 2021-04-30 NOTE — Transfer of Care (Signed)
Immediate Anesthesia Transfer of Care Note  Patient: [name]  Procedure(s) Performed: left lower extremity angiogram with second order cannulation (Left: Groin)  Patient Location: PACU  Anesthesia Type:MAC  Level of Consciousness: awake and alert   Airway & Oxygen Therapy: Patient Spontanous Breathing and Patient connected to nasal cannula oxygen  Post-op Assessment: Report given to RN and Post -op Vital signs reviewed and stable  Post vital signs: Reviewed and stable  Last Vitals:  Vitals Value Taken Time  BP 150/98 04/30/21 1544  Temp    Pulse 77 04/30/21 1545  Resp 16 04/30/21 1546  SpO2 100 % 04/30/21 1545  Vitals shown include unvalidated device data.  Last Pain:  Vitals:   04/30/21 1236  TempSrc: Oral  PainSc:       Patients Stated Pain Goal: 0 (66/81/59 4707)  Complications: No notable events documented.

## 2021-04-30 NOTE — Progress Notes (Signed)
   VASCULAR SURGERY ASSESSMENT & PLAN:   PVD WITH GANGRENE BOTH LOWER EXTREMITIES: left lower extremity angiography with intervention in OR.    SUBJECTIVE:   No specific complaints.  PHYSICAL EXAM:   Vitals:   04/29/21 2032 04/30/21 0615 04/30/21 1119 04/30/21 1236  BP: 118/79 128/87 117/84 120/84  Pulse: 96 80 75 76  Resp: (!) 24 17 19 18   Temp: 99.1 F (37.3 C) 97.8 F (36.6 C) 98.3 F (36.8 C) 98.3 F (36.8 C)  TempSrc: Oral Oral Oral Oral  SpO2: 92% 100% (!) 88% 100%  Weight:  75 kg  75 kg  Height:    5\' 1"  (1.549 m)   No change to the wounds on both legs. The right foot is warm and well-perfused. The left foot is slightly cooler than the right.  LABS:   Lab Results  Component Value Date   WBC 10.1 04/30/2021   HGB 8.7 (L) 04/30/2021   HCT 29.1 (L) 04/30/2021   MCV 94.5 04/30/2021   PLT 261 04/30/2021   Lab Results  Component Value Date   CREATININE 1.48 (H) 04/30/2021   Lab Results  Component Value Date   INR 1.7 (H) 04/21/2021   CBG (last 3)  Recent Labs    04/30/21 0609 04/30/21 1113 04/30/21 1241  GLUCAP 129* 108* 119*     PROBLEM LIST:    Principal Problem:   Sepsis ([place]) Active Problems:   Essential hypertension   INSOMNIA   Medical non-compliance   Poor mobility   Lower extremity weakness   HTN (hypertension), malignant   Hypokalemia   Diabetic neuropathy (HCC)   CKD (chronic kidney disease) stage 3, GFR 30-59 ml/min (HCC)   Severe recurrent major depression without psychotic features (HCC)   COPD (chronic obstructive pulmonary disease) (HCC)   At high risk for falls   PAF (paroxysmal atrial fibrillation) (HCC)   Atrial fibrillation with RVR (HCC)   Seizures (HCC)   Sacral ulcer, limited to breakdown of skin (HCC)   Reflux esophagitis   Multiple duodenal ulcers   Chronic respiratory failure with hypoxia (HCC)   Hypomagnesemia   PVD (peripheral vascular disease) (HCC)   Atherosclerosis of native artery of both legs with  gangrene (HCC)   MDD (major depressive disorder), recurrent episode, moderate (HCC)   CURRENT MEDS:    [MAR Hold] amLODipine  10 mg Oral Daily   [MAR Hold] aspirin EC  81 mg Oral Daily   [MAR Hold] DULoxetine  60 mg Oral Daily   [MAR Hold] enoxaparin (LOVENOX) injection  75 mg Subcutaneous Q12H   [MAR Hold] insulin aspart  0-5 Units Subcutaneous QHS   [MAR Hold] insulin aspart  0-9 Units Subcutaneous TID WC   [MAR Hold] insulin aspart  3 Units Subcutaneous TID WC   [MAR Hold] levETIRAcetam  750 mg Oral BID   [MAR Hold] metoprolol tartrate  50 mg Oral BID   [MAR Hold] oxybutynin  5 mg Oral QHS   [MAR Hold] pantoprazole  40 mg Oral Daily   [MAR Hold] QUEtiapine  25 mg Oral QHS   [MAR Hold] rosuvastatin  10 mg Oral Daily   [MAR Hold] traZODone  50 mg Oral QHS   [MAR Hold] zinc sulfate  220 mg Oral Daily   [name]. Stanford Breed, MD [facility] Phone Number: [phone] 04/30/2021 2:08 PM

## 2021-04-30 NOTE — Op Note (Signed)
DATE OF SERVICE: 04/30/2021  PATIENT:  [name]  55 y.o. female  PRE-OPERATIVE DIAGNOSIS:  Atherosclerosis of native arteries of left lower extremity causing ulceration  POST-OPERATIVE DIAGNOSIS:  Same  PROCEDURE:   1) US guided right common femoral access 2) left lower extremity angiogram with second order cannulation    SURGEON:  Surgeon(s) and Role:    * Cherre Robins, MD - Primary  ASSISTANT: none  ANESTHESIA:   local and MAC  EBL: min  BLOOD ADMINISTERED:none  DRAINS: none   LOCAL MEDICATIONS USED:  LIDOCAINE   SPECIMEN:  none  COUNTS: confirmed correct .  TOURNIQUET:  none  PATIENT DISPOSITION:  PACU - hemodynamically stable.   Delay start of Pharmacological VTE agent (>24hrs) due to surgical blood loss or risk of bleeding: no  INDICATION FOR PROCEDURE: [name] is a 55 y.o. female with mixed venous / arterial ulceration about her malleloli bilaterally. I had previously done an angiogram with SFA stenting on the right 04/23/21. Retrograde angiogram done at that time showed left SFA disease. After careful discussion of risks, benefits, and alternatives the patient was offered left lower extremity angiogram. We specifically discussed access site complications. The patient understood and wished to proceed.  OPERATIVE FINDINGS:  Left external iliac / common femoral / profunda femoris without flow limiting stenosis Left superficial femoral artery diffusely diseased, but without flow limiting stenosis Left popliteal artery without flow limiting stenosis Tibial runoff disadvantaged AT is patent from origin to foot Mild mid AT stenosis Reconstitution of PT / Peroneal at the foot via AT arborization  DESCRIPTION OF PROCEDURE: After identification of the patient in the pre-operative holding area, the patient was transferred to the operating room. The patient was positioned supine on the operating room table. Anesthesia was induced. The groins was prepped  and draped in standard fashion. A surgical pause was performed confirming correct patient, procedure, and operative location.  The right groin was anesthetized with subcutaneous injection of 1% lidocaine. Using ultrasound guidance, the right common femoral artery was accessed with micropuncture technique. Fluoroscopy was used to confirm cannulation over the femoral head. Sheathogram was not performed. The 66F sheath was upsized to 14F.   An 035 glidewire advantage was advanced into the distal aorta. Over the wire an omni flush catheter was advanced to the level of L2.   The left common iliac artery was selected with the 035 glidewire advantage. The wire was advanced into the common femoral artery. Over the wire the omni flush catheter was advanced into the external iliac artery. Selective angiography was performed - see above for details.   A mynx device was used to close the arteriotomy. Hemostasis was excellent upon completion.  Upon completion of the case instrument and sharps counts were confirmed correct. The patient was transferred to the PACU in good condition. I was present for all portions of the procedure.  PLAN: in line flow to the foot. Patient's wounds will likely heal with local wound care. Continue best medical therapy for PAD (ASA / Statin).  [name]. Stanford Breed, MD [facility] Phone Number: [phone] 04/30/2021 3:30 PM

## 2021-04-30 NOTE — Anesthesia Preprocedure Evaluation (Addendum)
Anesthesia Evaluation  Patient identified by MRN, date of birth, ID band Patient awake    Reviewed: Allergy & Precautions, NPO status , Patient's Chart, lab work & pertinent test results, reviewed documented beta blocker date and time   History of Anesthesia Complications Negative for: history of anesthetic complications  Airway Mallampati: I  TM Distance: >3 FB Neck ROM: Full    Dental  (+) Edentulous Upper, Edentulous Lower   Pulmonary sleep apnea and Continuous Positive Airway Pressure Ventilation , COPD, Current Smoker and Patient abstained from smoking.,    breath sounds clear to auscultation       Cardiovascular hypertension, Pt. on medications and Pt. on home beta blockers (-) angina+ Peripheral Vascular Disease  + dysrhythmias Atrial Fibrillation and Supra Ventricular Tachycardia  Rhythm:Regular Rate:Normal  '17 cath: normal coronaries 04/2021 ECHO: EF 60-65%, mod LVH, normal LVF, mild MR   Neuro/Psych Seizures -,  Anxiety Depression CVA    GI/Hepatic GERD  ,(+)     substance abuse  alcohol use and cocaine use,   Endo/Other  diabetes (glu 114), Insulin Dependentobese  Renal/GU Renal InsufficiencyRenal disease     Musculoskeletal   Abdominal (+) + obese,   Peds  Hematology  (+) Blood dyscrasia (Hb 8.7), anemia , Eliquis   Anesthesia Other Findings   Reproductive/Obstetrics Post menopausal                            Anesthesia Physical Anesthesia Plan  ASA: 3  Anesthesia Plan: MAC   Post-op Pain Management:    Induction:   PONV Risk Score and Plan: 2 and Ondansetron, Dexamethasone and Treatment may vary due to age or medical condition  Airway Management Planned:   Additional Equipment: None  Intra-op Plan:   Post-operative Plan:   Informed Consent: I have reviewed the patients History and Physical, chart, labs and discussed the procedure including the risks, benefits  and alternatives for the proposed anesthesia with the patient or authorized representative who has indicated his/her understanding and acceptance.       Plan Discussed with: CRNA and Surgeon  Anesthesia Plan Comments: (Plan routine monitors, MAC for angiogram, convert to GA if intervention required)       Anesthesia Quick Evaluation

## 2021-04-30 NOTE — Progress Notes (Signed)
Received report from [place], [place]. Per RN, pt refused all meds unless given with applesauce - pt took morning dose BB with a bite of applesauce at 0919. Dr. Glennon Mac made aware, no further orders.

## 2021-04-30 NOTE — Consult Note (Signed)
[facility] Consult   Reason for Consult:  Depression, mood swings? Referring Physician:  Sander Radon, MD Patient Identification: [name] MRN:  [id number] Principal Diagnosis: Sepsis [facility]) Diagnosis:  Principal Problem:   Sepsis ([place]) Active Problems:   Essential hypertension   INSOMNIA   Medical non-compliance   Poor mobility   Lower extremity weakness   HTN (hypertension), malignant   Hypokalemia   Diabetic neuropathy ([place])   CKD (chronic kidney disease) stage 3, GFR 30-59 ml/min (HCC)   Severe recurrent major depression without psychotic features (HCC)   COPD (chronic obstructive pulmonary disease) (HCC)   At high risk for falls   PAF (paroxysmal atrial fibrillation) (HCC)   Atrial fibrillation with RVR (HCC)   Seizures (HCC)   Sacral ulcer, limited to breakdown of skin (HCC)   Reflux esophagitis   Multiple duodenal ulcers   Chronic respiratory failure with hypoxia (HCC)   Hypomagnesemia   PVD (peripheral vascular disease) (HCC)   Atherosclerosis of native artery of both legs with gangrene (HCC)   MDD (major depressive disorder), recurrent episode, moderate ([place])   Total Time spent with patient: 30 minutes  Subjective:   [name] is a 55 y.o. female patient admitted with with bilateral leg pain w/ PMH of depression, tobacco use disorder, severe PVD, paroxysmal aticoagulant, HLD, HTN, uncontrolled T2DM, CHF, CKD.   HPI:  On assessment this AM patient appears bundled in blankets up to her neck. Patient only speaks in short answers or prefers to nod or shake her head. Patient was able to report that she did sleep well last night and was had not seen the snakes since last night. Patient was NPO for her procedure which she signed a consent form for.   Patient son came to see patient and was in the room for the procedure. This provider also spoke to the son and he denied having any concern for patient's mental health or behaviors.   Past  Psychiatric History: Patient reported taking Cymbalta 60mg  for depression and Trazodone 50mg  but is unsure who prescribes it; likely her facility PCP.  Risk to Self:  NO Risk to Others:  NO Prior Inpatient Therapy:  NO Prior Outpatient Therapy:  NO  Past Medical History:  Past Medical History:  Diagnosis Date   Alcohol use    Ankle fracture, lateral malleolus, closed 2013   Anxiety    Aortic atherosclerosis ([place]) 03/10/2021   Breast mass, left 2013   CHF (congestive heart failure) ([place])    a. EF 20-25% by echo in 05/2016 with cath showing normal cors b. EF 50-55% in 07/2020 c. 01/2021: EF at 55-60% with moderate LVH   Chronic anemia    CKD (chronic kidney disease), stage III (HCC)    Cocaine abuse (HCC)    COPD (chronic obstructive pulmonary disease) ([place])    Diabetes mellitus, type 2 ([place])    Diabetic Charcot foot ([place])    Essential hypertension    History of cardiomyopathy    History of GI bleed 2011   Hyperlipidemia    Noncompliance    Obesity    PAF (paroxysmal atrial fibrillation) (HCC)    Panic attacks    PAT (paroxysmal atrial tachycardia) (HCC)    Previously on Amiodarone   Seizures ([place]) 10/01/2020   Sleep apnea    Not on CPAP   Stroke ([place]) 2018   Tobacco abuse    Urinary incontinence     Past Surgical History:  Procedure Laterality Date   ANGIOPLASTY  ILLIAC ARTERY Right 04/23/2021   Procedure: ANGIOPLASTY AND STENT SUPERFICIAL FEMORAL ARTERY;  Surgeon: Cherre Robins, MD;  Location: [facility];  Service: Vascular;  Laterality: Right;   AORTOGRAM  04/23/2021   Procedure: AORTOGRAM;  Surgeon: Cherre Robins, MD;  Location: [facility];  Service: Vascular;;   BIOPSY  11/12/2020   Procedure: BIOPSY;  Surgeon: Harvel Quale, MD;  [facility];  Service: Gastroenterology;;   BREAST BIOPSY     CARDIAC CATHETERIZATION N/A 07/28/2016   Procedure: Left Heart Cath and Coronary Angiography;  Surgeon: Jettie Booze, MD;  Location: [place] [facility];   Service: Cardiovascular;  Laterality: N/A;   COLONOSCOPY N/A 05/10/2019   Procedure: COLONOSCOPY;  Surgeon: Danie Binder, MD;  [facility];  Service: Endoscopy;  Laterality: N/A;  Phenergan 12.5 mg IV in pre-op   DILATION AND CURETTAGE OF UTERUS     ESOPHAGOGASTRODUODENOSCOPY (EGD) WITH PROPOFOL N/A 11/12/2020   Procedure: ESOPHAGOGASTRODUODENOSCOPY (EGD) WITH PROPOFOL;  Surgeon: Harvel Quale, MD;  [facility];  Service: Gastroenterology;  Laterality: N/A;   I & D EXTREMITY Bilateral 09/22/2017   Procedure: BILATERAL DEBRIDEMENT LEG/FOOT ULCERS, APPLY VERAFLO WOUND VAC;  Surgeon: Newt Minion, MD;  Location: [place];  Service: Orthopedics;  Laterality: Bilateral;   I & D EXTREMITY Right 10/11/2018   Procedure: IRRIGATION AND DEBRIDEMENT RIGHT HAND;  Surgeon: [name], MD;  Location: [place];  Service: Orthopedics;  Laterality: Right;   I & D EXTREMITY Right 10/13/2018   Procedure: REPEAT IRRIGATION AND DEBRIDEMENT RIGHT HAND;  Surgeon: [name], MD;  Location: [place];  Service: Orthopedics;  Laterality: Right;   I & D EXTREMITY Right 11/22/2018   Procedure: IRRIGATION AND DEBRIDEMENT AND PINNING RIGHT HAND;  Surgeon: [name], MD;  Location: [place];  Service: Orthopedics;  Laterality: Right;   IR RADIOLOGIST EVAL & MGMT  07/05/2018   LOWER EXTREMITY ANGIOGRAM Bilateral 04/23/2021   Procedure: RIGHT  AND LEFT LOWER EXTREMITY ANGIOGRAM;  Surgeon: Cherre Robins, MD;  Location: [facility];  Service: Vascular;  Laterality: Bilateral;   POLYPECTOMY  05/10/2019   Procedure: POLYPECTOMY;  Surgeon: Danie Binder, MD;  [facility];  Service: Endoscopy;;   SKIN SPLIT GRAFT Bilateral 09/28/2017   Procedure: BILATERAL SPLIT THICKNESS SKIN GRAFT LEGS/FEET AND APPLY VAC;  Surgeon: Newt Minion, MD;  Location: [facility];  Service: Orthopedics;  Laterality: Bilateral;   SKIN SPLIT GRAFT Right 11/22/2018   Procedure: SKIN GRAFT SPLIT THICKNESS;  Surgeon:  [name], MD;  Location: [place];  Service: Orthopedics;  Laterality: Right;   Family History:  Family History  Problem Relation Age of Onset   Hypertension Mother    Heart attack Mother    Hypertension Father        CABG    Hypertension Sister    Hypertension Brother    Hypertension Sister    Cancer Sister        breast    Arthritis Other    Cancer Other    Diabetes Other    Asthma Other    Hypertension Daughter    Hypertension Son    Family Psychiatric  History: None reported Social History:  Social History   Substance and Sexual Activity  Alcohol Use Not Currently   Alcohol/week: 0.0 standard drinks   Comment: Quit in 2017     Social History   Substance and Sexual Activity  Drug Use Not Currently   Frequency:  3.0 times per week   Types: Marijuana, Cocaine   Comment: none since August 2018    Social History   Socioeconomic History   Marital status: Widowed    Spouse name: Not on file   Number of children: 2   Years of education: 9   Highest education level: 9th grade  Occupational History   Occupation: Disabled  Tobacco Use   Smoking status: Some Days    Packs/day: 0.25    Years: 20.00    Pack years: 5.00    Types: Cigarettes    Last attempt to quit: 06/03/2017    Years since quitting: 3.9   Smokeless tobacco: Never  Vaping Use   Vaping Use: Never used  Substance and Sexual Activity   Alcohol use: Not Currently    Alcohol/week: 0.0 standard drinks    Comment: Quit in 2017   Drug use: Not Currently    Frequency: 3.0 times per week    Types: Marijuana, Cocaine    Comment: none since August 2018   Sexual activity: Not on file  Other Topics Concern   Not on file  Social History Narrative   Right handed   Drinks 1-2 cups caffeine daily   Social Determinants of Health   Financial Resource Strain: Medium Risk   Difficulty of Paying Living Expenses: Somewhat hard  Food Insecurity: Food Insecurity Present   Worried About Charity fundraiser  in the Last Year: Sometimes true   Ran Out of Food in the Last Year: Sometimes true  Transportation Needs: No Transportation Needs   Lack of Transportation (Medical): No   Lack of Transportation (Non-Medical): No  Physical Activity: Insufficiently Active   Days of Exercise per Week: 7 days   Minutes of Exercise per Session: 20 min  Stress: Stress Concern Present   Feeling of Stress : To some extent  Social Connections: Moderately Isolated   Frequency of Communication with Friends and Family: More than three times a week   Frequency of Social Gatherings with Friends and Family: More than three times a week   Attends Religious Services: More than 4 times per year   Active Member of [employer] or Organizations: No   Attends Archivist Meetings: Never   Marital Status: Widowed   Additional Social History:    Allergies:  No Known Allergies  Labs:  Results for orders placed or performed during the hospital encounter of 04/21/21 (from the past 48 hour(s))  Glucose, capillary     Status: Abnormal   Collection Time: 04/28/21  5:21 PM  Result Value Ref Range   Glucose-Capillary 113 (H) 70 - 99 mg/dL    Comment: Glucose reference range applies only to samples taken after fasting for at least 8 hours.   Comment 1 Notify RN    Comment 2 Document in Chart   Glucose, capillary     Status: Abnormal   Collection Time: 04/28/21  9:57 PM  Result Value Ref Range   Glucose-Capillary 159 (H) 70 - 99 mg/dL    Comment: Glucose reference range applies only to samples taken after fasting for at least 8 hours.   Comment 1 Notify RN   Basic metabolic panel     Status: Abnormal   Collection Time: 04/29/21  2:24 AM  Result Value Ref Range   Sodium 143 135 - 145 mmol/L   Potassium 3.3 (L) 3.5 - 5.1 mmol/L   Chloride 111 98 - 111 mmol/L   CO2 25 22 - 32 mmol/L  Glucose, Bld 153 (H) 70 - 99 mg/dL    Comment: Glucose reference range applies only to samples taken after fasting for at least 8 hours.    BUN 12 6 - 20 mg/dL   Creatinine, Ser 1.50 (H) 0.44 - 1.00 mg/dL   Calcium 8.8 (L) 8.9 - 10.3 mg/dL   GFR, Estimated 41 (L) >60 mL/min    Comment: (NOTE) Calculated using the CKD-EPI Creatinine Equation (2021)    Anion gap 7 5 - 15    Comment: Performed at [facility] [address] [place], [place] [address]  Magnesium     Status: Abnormal   Collection Time: 04/29/21  2:24 AM  Result Value Ref Range   Magnesium 1.6 (L) 1.7 - 2.4 mg/dL    Comment: Performed at [facility] [address], [place], [place] [address]  Hemoglobin and hematocrit, blood     Status: Abnormal   Collection Time: 04/29/21  2:24 AM  Result Value Ref Range   Hemoglobin 7.7 (L) 12.0 - 15.0 g/dL   HCT 26.7 (L) 36.0 - 46.0 %    Comment: Performed at [facility], [place] [address], [place], [place] [address]  Glucose, capillary     Status: Abnormal   Collection Time: 04/29/21  4:55 AM  Result Value Ref Range   Glucose-Capillary 134 (H) 70 - 99 mg/dL    Comment: Glucose reference range applies only to samples taken after fasting for at least 8 hours.   Comment 1 Notify RN   Glucose, capillary     Status: Abnormal   Collection Time: 04/29/21  7:49 AM  Result Value Ref Range   Glucose-Capillary 129 (H) 70 - 99 mg/dL    Comment: Glucose reference range applies only to samples taken after fasting for at least 8 hours.  Glucose, capillary     Status: Abnormal   Collection Time: 04/29/21 11:30 AM  Result Value Ref Range   Glucose-Capillary 130 (H) 70 - 99 mg/dL    Comment: Glucose reference range applies only to samples taken after fasting for at least 8 hours.  Glucose, capillary     Status: Abnormal   Collection Time: 04/29/21  4:16 PM  Result Value Ref Range   Glucose-Capillary 115 (H) 70 - 99 mg/dL    Comment: Glucose reference range applies only to samples taken after fasting for at least 8 hours.   Comment 1 Notify RN    Comment 2 Document in Chart   Glucose, capillary     Status:  Abnormal   Collection Time: 04/29/21  9:24 PM  Result Value Ref Range   Glucose-Capillary 167 (H) 70 - 99 mg/dL    Comment: Glucose reference range applies only to samples taken after fasting for at least 8 hours.   Comment 1 Notify RN    Comment 2 Document in Chart   Glucose, capillary     Status: Abnormal   Collection Time: 04/30/21  3:05 AM  Result Value Ref Range   Glucose-Capillary 142 (H) 70 - 99 mg/dL    Comment: Glucose reference range applies only to samples taken after fasting for at least 8 hours.   Comment 1 Notify RN    Comment 2 Document in Chart   Basic metabolic panel     Status: Abnormal   Collection Time: 04/30/21  4:15 AM  Result Value Ref Range   Sodium 143 135 - 145 mmol/L   Potassium 3.1 (L) 3.5 - 5.1 mmol/L  Chloride 113 (H) 98 - 111 mmol/L   CO2 23 22 - 32 mmol/L   Glucose, Bld 146 (H) 70 - 99 mg/dL    Comment: Glucose reference range applies only to samples taken after fasting for at least 8 hours.   BUN 10 6 - 20 mg/dL   Creatinine, Ser 1.48 (H) 0.44 - 1.00 mg/dL   Calcium 8.7 (L) 8.9 - 10.3 mg/dL   GFR, Estimated 41 (L) >60 mL/min    Comment: (NOTE) Calculated using the CKD-EPI Creatinine Equation (2021)    Anion gap 7 5 - 15    Comment: Performed at [facility] [address], [place], [place] [address]  Magnesium     Status: None   Collection Time: 04/30/21  4:15 AM  Result Value Ref Range   Magnesium 2.1 1.7 - 2.4 mg/dL    Comment: Performed at [facility] [address], [place], [place] [address]  CBC with Differential/Platelet     Status: Abnormal   Collection Time: 04/30/21  4:15 AM  Result Value Ref Range   WBC 10.1 4.0 - 10.5 K/uL   RBC 3.08 (L) 3.87 - 5.11 MIL/uL   Hemoglobin 8.7 (L) 12.0 - 15.0 g/dL   HCT 29.1 (L) 36.0 - 46.0 %   MCV 94.5 80.0 - 100.0 fL   MCH 28.2 26.0 - 34.0 pg   MCHC 29.9 (L) 30.0 - 36.0 g/dL   RDW 17.2 (H) 11.5 - 15.5 %   Platelets 261 150 - 400 K/uL   nRBC 0.4 (H) 0.0 - 0.2 %   Neutrophils  Relative % 69 %   Neutro Abs 7.1 1.7 - 7.7 K/uL   Lymphocytes Relative 19 %   Lymphs Abs 1.9 0.7 - 4.0 K/uL   Monocytes Relative 7 %   Monocytes Absolute 0.7 0.1 - 1.0 K/uL   Eosinophils Relative 3 %   Eosinophils Absolute 0.3 0.0 - 0.5 K/uL   Basophils Relative 1 %   Basophils Absolute 0.1 0.0 - 0.1 K/uL   Immature Granulocytes 1 %   Abs Immature Granulocytes 0.07 0.00 - 0.07 K/uL    Comment: Performed at [facility], [place] [address], [place],  [address]  Glucose, capillary     Status: Abnormal   Collection Time: 04/30/21  6:09 AM  Result Value Ref Range   Glucose-Capillary 129 (H) 70 - 99 mg/dL    Comment: Glucose reference range applies only to samples taken after fasting for at least 8 hours.   Comment 1 Notify RN    Comment 2 Document in Chart   Glucose, capillary     Status: Abnormal   Collection Time: 04/30/21 11:13 AM  Result Value Ref Range   Glucose-Capillary 108 (H) 70 - 99 mg/dL    Comment: Glucose reference range applies only to samples taken after fasting for at least 8 hours.  Glucose, capillary     Status: Abnormal   Collection Time: 04/30/21 12:41 PM  Result Value Ref Range   Glucose-Capillary 119 (H) 70 - 99 mg/dL    Comment: Glucose reference range applies only to samples taken after fasting for at least 8 hours.   *Note: Due to a large number of results and/or encounters for the requested time period, some results have not been displayed. A complete set of results can be found in Results Review.    Current Facility-Administered Medications  Medication Dose Route Frequency Provider Last Rate Last Admin   [MAR Hold] acetaminophen (  TYLENOL) tablet 650 mg  650 mg Oral Q6H PRN Wynetta Emery, Clanford L, MD   650 mg at 04/30/21 0743   Or   [MAR Hold] acetaminophen (TYLENOL) suppository 650 mg  650 mg Rectal Q6H PRN Murlean Iba, MD       [MAR Hold] amLODipine (NORVASC) tablet 10 mg  10 mg Oral Daily Johnson, Clanford L, MD   10 mg at 04/30/21 0919    [MAR Hold] aspirin EC tablet 81 mg  81 mg Oral Daily Dagoberto Ligas, PA-C   81 mg at 04/28/21 0834   [MAR Hold] DULoxetine (CYMBALTA) DR capsule 60 mg  60 mg Oral Daily Johnson, Clanford L, MD   60 mg at 04/28/21 0834   [MAR Hold] enoxaparin (LOVENOX) injection 75 mg  75 mg Subcutaneous Q12H Dang, Thuy D, [facility]       [facility] Hold] insulin aspart (novoLOG) injection 0-5 Units  0-5 Units Subcutaneous QHS Johnson, Clanford L, MD       [MAR Hold] insulin aspart (novoLOG) injection 0-9 Units  0-9 Units Subcutaneous TID WC Johnson, Clanford L, MD   1 Units at 04/30/21 0752   [MAR Hold] insulin aspart (novoLOG) injection 3 Units  3 Units Subcutaneous TID WC Johnson, Clanford L, MD   3 Units at 04/26/21 1202   lactated ringers infusion   Intravenous Continuous Annye Asa, MD 10 mL/hr at 04/30/21 1255 New Bag at 04/30/21 1255   [MAR Hold] levETIRAcetam (KEPPRA) tablet 750 mg  750 mg Oral BID Johnson, Clanford L, MD   750 mg at 04/30/21 0919   [MAR Hold] lip balm (CARMEX) ointment   Topical PRN Cristal Ford, DO       [facility] Hold] LORazepam (ATIVAN) tablet 0.5 mg  0.5 mg Oral TID PRN Wynetta Emery, Clanford L, MD   0.5 mg at 04/28/21 1845   [MAR Hold] metoprolol tartrate (LOPRESSOR) tablet 50 mg  50 mg Oral BID Johnson, Clanford L, MD   50 mg at 04/30/21 0919   [MAR Hold] morphine 2 MG/ML injection 2 mg  2 mg Intravenous Q4H PRN Cristal Ford, DO   2 mg at 04/29/21 1335   [MAR Hold] ondansetron (ZOFRAN) tablet 4 mg  4 mg Oral Q6H PRN Johnson, Clanford L, MD       Or   [MAR Hold] ondansetron (ZOFRAN) injection 4 mg  4 mg Intravenous Q6H PRN Johnson, Clanford L, MD       [MAR Hold] oxybutynin (DITROPAN-XL) 24 hr tablet 5 mg  5 mg Oral QHS Johnson, Clanford L, MD   5 mg at 04/29/21 2120   [facility] Hold] oxyCODONE (Oxy IR/ROXICODONE) immediate release tablet 5-10 mg  5-10 mg Oral Q6H PRN Cristal Ford, DO   10 mg at 04/30/21 0743   [MAR Hold] pantoprazole (PROTONIX) EC tablet 40 mg  40 mg Oral Daily Johnson,  Clanford L, MD   40 mg at 04/28/21 0834   [MAR Hold] potassium chloride 10 mEq in 100 mL IVPB  10 mEq Intravenous Q1 Hr x 6 Allie Bossier, MD 100 mL/hr at 04/30/21 1227 10 mEq at 04/30/21 1227   [MAR Hold] QUEtiapine (SEROQUEL) tablet 25 mg  25 mg Oral QHS Damita Dunnings B, MD   25 mg at 04/29/21 2120   [MAR Hold] rosuvastatin (CRESTOR) tablet 10 mg  10 mg Oral Daily Johnson, Clanford L, MD   10 mg at 04/28/21 0834   [MAR Hold] traZODone (DESYREL) tablet 50 mg  50 mg Oral QHS Johnson, Eldridge Dace, MD  50 mg at 04/29/21 2119   [facility] Hold] vancomycin (VANCOREADY) IVPB 750 mg/150 mL  750 mg Intravenous Q24H Tyrone Apple, [facility]       [facility] Hold] zinc sulfate capsule 220 mg  220 mg Oral Daily Wynetta Emery, Clanford L, MD   220 mg at 04/28/21 6712    Musculoskeletal: Strength & Muscle Tone: decreased Gait & Station:  remains in bed Patient leans: N/A            Psychiatric Specialty Exam:  Presentation  General Appearance: Appropriate for Environment  Eye Contact:Minimal  Speech:-- (one word answers or shakes and nods her head)  Speech Volume:Decreased  Handedness: No data recorded  Mood and Affect  Mood:Euthymic  Affect:Blunt   Thought Process  Thought Processes:-- (not really able to eval as patient does not say much)  Descriptions of Associations:Intact  Orientation:-- (oriented to person and place but not time)  Thought Content:Logical  History of Schizophrenia/Schizoaffective disorder:No data recorded Duration of Psychotic Symptoms:No data recorded Hallucinations:Hallucinations: None (none reported today, says she saw "snakes" last night unsure if this was before or after meds) Description of Auditory Hallucinations: "I've been seeing snakes"  Ideas of Reference:None  Suicidal Thoughts:Suicidal Thoughts: No  Homicidal Thoughts:Homicidal Thoughts: No   Sensorium  Memory:Immediate Poor; Recent Poor; Remote Poor  Judgment:Impaired  Insight:None   Executive  Functions  Concentration:Poor  Attention Span:Poor  Recall:Poor  Fund of Knowledge:Poor  Language:Poor   Psychomotor Activity  Psychomotor Activity:Psychomotor Activity: Decreased   Assets  Assets:Social Support; Resilience   Sleep  Sleep:Sleep: Fair   Physical Exam: Physical Exam Constitutional:      Appearance: Normal appearance.  HENT:     Head: Normocephalic and atraumatic.  Eyes:     Extraocular Movements: Extraocular movements intact.     Conjunctiva/sclera: Conjunctivae normal.  Cardiovascular:     Rate and Rhythm: Normal rate.  Pulmonary:     Effort: Pulmonary effort is normal.     Breath sounds: Normal breath sounds.  Abdominal:     General: Abdomen is flat.  Musculoskeletal:        General: Normal range of motion.  Skin:    General: Skin is warm and dry.  Neurological:     General: No focal deficit present.     Mental Status: She is alert.   ROS Blood pressure 120/84, pulse 76, temperature 98.3 F (36.8 C), temperature source Oral, resp. rate 18, height 5\' 1"  (1.549 m), weight 75 kg, last menstrual period 05/19/2016, SpO2 100 %. Body mass index is 31.24 kg/m.  Treatment Plan Summary: Medication management MDD, recurrent, moderate w/ psychosis Patient denies VH today. Patient continues to have minimal interaction on exam; however family reports that this is normal behavior for patient when she does not wish to be bothered. At this time no other concerns, patient has been compliant with her medications and following through on recommended procedures. Patient Qtc this AM was 436, can continue current medication regimen. - Continue Cymbalta 60mg  - Continue Seroquel 25mg  QHS, patient is reported she was ok with this medication thus far - Continue Trazodone 50mg  QHS    Psychiatry will sign off. Please do not hesitate to call back if questions arise. Thank you for this consult.   Disposition: Supportive therapy provided about ongoing  stressors.  PGY-2 Freida Busman, MD 04/30/2021 1:17 PM

## 2021-04-30 NOTE — Anesthesia Postprocedure Evaluation (Signed)
Anesthesia Post Note  Patient: [name]  Procedure(s) Performed: left lower extremity angiogram with second order cannulation (Left: Groin)     Patient location during evaluation: PACU Anesthesia Type: MAC Level of consciousness: sedated, patient cooperative and oriented Pain management: pain level controlled Vital Signs Assessment: post-procedure vital signs reviewed and stable Respiratory status: spontaneous breathing, nonlabored ventilation and respiratory function stable Cardiovascular status: blood pressure returned to baseline and stable Postop Assessment: no apparent nausea or vomiting Anesthetic complications: no   No notable events documented.  Last Vitals:  Vitals:   04/30/21 1600 04/30/21 1606  BP: (!) 145/98 (!) 130/98  Pulse:    Resp: 16 14  Temp:  36.7 C  SpO2: 100% 100%    Last Pain:  Vitals:   04/30/21 1606  TempSrc:   PainSc: Asleep                 Rani Sisney,E. [place]

## 2021-04-30 NOTE — Progress Notes (Signed)
Advised nothing by mouth post midnight for procedure of left lower arteriography, pt agreed, removed all cups and food in the table

## 2021-04-30 NOTE — Progress Notes (Signed)
PROGRESS NOTE    [name]  WTU:[id number] DOB: Feb 19, 1966 DOA: 04/21/2021 PCP: Caprice Renshaw, MD     Brief Narrative:   [name] was admitted with the working diagnosis of sepsis due to bilateral lower extremity leg ulcerations with cellulitis and dry gangrene. Staphylococcus cutis bacteremia.    [name] PMHx MDD, Seizures, tobacco abuse, COPD, severe peripheral vascular disease, paroxysmal atrial fibrillation, HTN, Dyslipidemia, DM Type 2 with complications, Diabetic Neuropathy,  Chronic Diastolic CHF, CKD stage III,  Presented with bilateral leg pain.  She was noted to have decreased p.o. intake at her nursing facility.  She does have chronic ulcerated lesions bilateral legs.  On her initial physical examination temperature 98.1, heart rate 150, respiratory rate 22, heart rate 90, blood pressure 116/92, oxygen saturation 93%, her lungs were clear to auscultation bilaterally, heart S1-S2, present, rhythmic, soft abdomen, positive pressure ulcers ventral side of distal lower extremities.  Stage IV pressure ulcers with necrotic skin.    Patient was placed on broad-spectrum antibiotic therapy with vancomycin and cefepime. Vascular surgery and orthopedic were consulted.   Patient was scheduled to have left common femoral artery access aortogram and right lower extremity angiography but patient declined to proceed on the Cath Lab table. This was discussed with the sister, and procedure was reattempted successfully under general anesthesia.    06/29 Sp right superficial femoral and popliteal artery stenting   Positive blood cultures for staph cutis  #2/2    Patient with poor IV acces, has declined PICC line several times.    Poor oral intake and continue to decline IV, change antibiotic therapy to oral doxycycline.   07/05 has new IV and Vancomycin resumed. Plan for left leg angioplasty on 07/06.      Subjective: A/O x4, continues to be somewhat uncooperative.  At times  will refuse to answer required questions, (example: Do you want to have your surgery?).  Son at bedside would have to ask his mother to cooperate with myself and RN.    Assessment & Plan: Covid vaccination; vaccinated 2/3   Principal Problem:   Sepsis ([place]) Active Problems:   Essential hypertension   INSOMNIA   Medical non-compliance   Poor mobility   Lower extremity weakness   HTN (hypertension), malignant   Hypokalemia   Diabetic neuropathy (HCC)   CKD (chronic kidney disease) stage 3, GFR 30-59 ml/min (HCC)   Severe recurrent major depression without psychotic features (HCC)   COPD (chronic obstructive pulmonary disease) (HCC)   At high risk for falls   PAF (paroxysmal atrial fibrillation) (HCC)   Atrial fibrillation with RVR (HCC)   Seizures (HCC)   Sacral ulcer, limited to breakdown of skin (HCC)   Reflux esophagitis   Multiple duodenal ulcers   Chronic respiratory failure with hypoxia (HCC)   Hypomagnesemia   PVD (peripheral vascular disease) (HCC)   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 (Present on admission)/ Staphylococcus bacteremia. Blood cultures #2/2 positive for staphylococcus cutis Sp right superficial femoral and popliteal artery stenting.   Follow blood cultures continue with no growth. Staphylococcus cutis sensitive to vancomycin resistant to cipro and erythromycin. Echocardiogram with no evident vegetation.  Patient had 2 days of oral antibiotic therapy due to no IV access. Now that patient has a functioning IV access will resume vancomycin. Plan for left leg revascularization  tomorrow, then will get final recommendations from orthopedics. Depending of this recommendations will plan duration of antibiotic therapy.    Severe PVD     DM type II uncontrolled with complication: DM nephropathy, PVD  patient with poor oral intake, her fasting glucose today is 153. Continue insulin sliding scale for glucose cover and monitoring.   Chronic respiratory failure with hypoxia - Continue with supplemental 02 per [place] Resolved.     Paroxysmal atrial fibrillation. Continue with metoprolol for rate control and apixaban for  anticoagulation     Essential HTN /  Dyslipidemia.   On amlodipine and metoprolol for blood pressure control. On statin    AKI on CKD stage 3b(baseline ~Cr 1.5)  Lab Results  Component Value Date   CREATININE 1.48 (H) 04/30/2021   CREATININE 1.50 (H) 04/29/2021   CREATININE 1.65 (H) 04/28/2021   CREATININE 1.69 (H) 04/27/2021   CREATININE 1.65 (H) 04/26/2021  -Baseline  Non anion gap metabolic acidosis.  -Resolved   Depression.   -Patient with severe mood swings, declining medical treatment at times. -Per Psychiatry patient and son not concerned with decision making/actions, declines any further intervention -continue with lorazepam, duloxetine, trazodone, keppra. Add quetiapine.     Chronic anemia, of chronic disease   her Hgb is 7,7 today with hct at 26,7 Continue close follow up on hgb and hct. No clinical signs of active bleeding   Multiple pressure injuries -See below  Pressure Injury 12/03/20 Perineum Posterior Stage 1 -  Intact skin with non-blanchable redness of a localized area usually over a bony prominence. (Active)  12/03/20 2210  Location: Perineum  Location Orientation: Posterior  Staging: Stage 1 -  Intact skin with non-blanchable redness of a localized area usually over a bony prominence.  Wound Description (Comments):   Present on Admission: Yes     Pressure Injury 12/04/20 Coccyx Right;Medial Stage 2 -  Partial  thickness loss of dermis presenting as a shallow open injury with a red, pink wound bed without slough. 1cm x 0.5cm area to the right of previously healed pressure ulcer (Active)  12/04/20 0800  Location: Coccyx  Location Orientation: Right;Medial  Staging: Stage 2 -  Partial thickness loss of dermis presenting as a shallow open injury with a red, pink wound bed without slough.  Wound Description (Comments): 1cm x 0.5cm area to the right of previously healed pressure ulcer  Present on Admission: Yes     Pressure Injury 03/12/21 Sacrum Mid Stage 2 -  Partial thickness loss of dermis presenting as a shallow open injury with a red, pink wound bed without slough. (Active)  03/12/21 1128  Location: Sacrum  Location Orientation: Mid  Staging: Stage 2 -  Partial thickness loss of dermis presenting as a shallow open injury with a red, pink wound bed without slough.  Wound Description (Comments):   Present on Admission: Yes     Pressure Injury 03/12/21 Thigh Right;Upper Stage 2 -  Partial thickness loss of dermis presenting as a shallow open injury with a red, pink wound bed without slough. (Active)  03/12/21 1131  Location: Thigh  Location Orientation: Right;Upper  Staging: Stage 2 -  Partial thickness loss of dermis presenting as a shallow open injury with a red, pink wound bed without slough.  Wound Description (Comments):   Present on Admission: Yes  Pressure Injury 03/23/21 Pretibial Distal;Left Stage 3 -  Full thickness tissue loss. Subcutaneous fat may be visible but bone, tendon or muscle are NOT exposed. 7 cm X 4.5 cm no tunneling (Active)  03/23/21 1344  Location: Pretibial  Location Orientation: Distal;Left  Staging: Stage 3 -  Full thickness tissue loss. Subcutaneous fat may be visible but bone, tendon or muscle are NOT exposed.  Wound Description (Comments): 7 cm X 4.5 cm no tunneling  Present on Admission: Yes     Pressure Injury 03/23/21 Tibial Left;Posterior;Proximal Stage 3  -  Full thickness tissue loss. Subcutaneous fat may be visible but bone, tendon or muscle are NOT exposed. 1 cm X 1 Cm with yellow sloth present (Active)  03/23/21 1357  Location: Tibial  Location Orientation: Left;Posterior;Proximal  Staging: Stage 3 -  Full thickness tissue loss. Subcutaneous fat may be visible but bone, tendon or muscle are NOT exposed.  Wound Description (Comments): 1 cm X 1 Cm with yellow sloth present  Present on Admission: Yes     Pressure Injury 03/23/21 Thigh Anterior;Left Stage 2 -  Partial thickness loss of dermis presenting as a shallow open injury with a red, pink wound bed without slough. 2 cm X 0.5 cm pink wound bed (Active)  03/23/21 1358  Location: Thigh  Location Orientation: Anterior;Left  Staging: Stage 2 -  Partial thickness loss of dermis presenting as a shallow open injury with a red, pink wound bed without slough.  Wound Description (Comments): 2 cm X 0.5 cm pink wound bed  Present on Admission:      Pressure Injury 03/23/21 Thigh Anterior;Right Stage 2 -  Partial thickness loss of dermis presenting as a shallow open injury with a red, pink wound bed without slough. 2 cm x 1 cm pink wound bed (Active)  03/23/21 1400  Location: Thigh  Location Orientation: Anterior;Right  Staging: Stage 2 -  Partial thickness loss of dermis presenting as a shallow open injury with a red, pink wound bed without slough.  Wound Description (Comments): 2 cm x 1 cm pink wound bed  Present on Admission: Yes     Pressure Injury 03/23/21 Buttocks Right;Posterior Stage 2 -  Partial thickness loss of dermis presenting as a shallow open injury with a red, pink wound bed without slough. 0.5 cm x 0.5 cm pink wound bed (Active)  03/23/21 1402  Location: Buttocks  Location Orientation: Right;Posterior  Staging: Stage 2 -  Partial thickness loss of dermis presenting as a shallow open injury with a red, pink wound bed without slough.  Wound Description (Comments): 0.5 cm x 0.5 cm  pink wound bed  Present on Admission: Yes     Pressure Injury 03/23/21 Buttocks Right Stage 2 -  Partial thickness loss of dermis presenting as a shallow open injury with a red, pink wound bed without slough. 3 cm x 1 cm pink wound bed (Active)  03/23/21 1404  Location: Buttocks  Location Orientation: Right  Staging: Stage 2 -  Partial thickness loss of dermis presenting as a shallow open injury with a red, pink wound bed without slough.  Wound Description (Comments): 3 cm x 1 cm pink wound bed  Present on Admission: Yes    Hypokalemia - Potassium goal> 4 - 7/6 potassium IV 50 mEq  Anemia unspecified - 7/6 anemia panel pending  Obese (BMI 31.24 kg/m)     DVT prophylaxis:  Code Status:  Family Communication: 7/6 son [name] at bedside for discussion of plan of care all questions  answered Status is: Inpatient    Dispo: The patient is from:               Anticipated d/c is to:               Anticipated d/c date is:               Patient currently       Consultants:  Vascular surgery    Procedures/Significant Events:  7/1 Echocardiogram limited:Left Ventricle: Left ventricular ejection fraction, by estimation, is 60  to 65%. The left ventricle has normal function. The left ventricle has no  regional wall motion abnormalities. The left ventricular internal cavity  size was normal in size.   Right Ventricle: There is mildly elevated pulmonary artery systolic  pressure. The tricuspid regurgitant velocity is 2.90 m/s, and with an  assumed right atrial pressure of 3 mmHg, the estimated right ventricular  systolic pressure is 62.3 mmHg.   Pericardium: There is no evidence of pericardial effusion.   Mitral Valve: The mitral valve is grossly normal. Mild mitral valve  regurgitation. There is no evidence of mitral valve vegetation.   Tricuspid Valve: The tricuspid valve is grossly normal. Tricuspid valve  regurgitation is mild. There is no evidence of tricuspid valve  vegetation.   Aortic Valve: The aortic valve is normal in structure. Aortic valve  regurgitation is not visualized. There is no evidence of aortic valve  vegetation.   Pulmonic Valve: The pulmonic valve was grossly normal. Pulmonic valve  regurgitation is mild.  7/6 s/p1) US guided right common femoral access 2) left lower extremity angiogram with second order cannulation  I have personally reviewed and interpreted all radiology studies and my findings are as above.   VENTILATOR SETTINGS:    Cultures 6/27 SARS coronavirus negative 6/27 influenza A/B negative 6/27 blood RIGHT AC positive staph capitis 6/27 blood RIGHT arm positive staph capitis 7/1 blood RIGHT AC negative final 7/1 blood RIGHT hand negative final   Antimicrobials: Anti-infectives (From admission, onward)    Start     Ordered Stop   04/30/21 2200  vancomycin (VANCOREADY) IVPB 750 mg/150 mL        04/29/21 1453     04/29/21 1545  vancomycin (VANCOREADY) IVPB 1500 mg/300 mL        04/29/21 1453 04/29/21 2314   04/27/21 1700  vancomycin (VANCOREADY) IVPB 1000 mg/200 mL  Status:  Discontinued        04/26/21 1159 04/27/21 1303   04/27/21 1700  vancomycin (VANCOCIN) IVPB 1000 mg/200 mL premix  Status:  Discontinued        04/27/21 1303 04/27/21 1331   04/27/21 1430  doxycycline (VIBRA-TABS) tablet 100 mg  Status:  Discontinued        04/27/21 1331 04/29/21 1433   04/24/21 1600  vancomycin (VANCOREADY) IVPB 1250 mg/250 mL  Status:  Discontinued        04/24/21 1519 04/26/21 1159   04/22/21 1200  vancomycin (VANCOREADY) IVPB 1250 mg/250 mL  Status:  Discontinued        04/21/21 1232 04/24/21 1519   04/21/21 2200  ceFEPIme (MAXIPIME) 2 g in sodium chloride 0.9 % 100 mL IVPB  Status:  Discontinued        04/21/21 1249 04/24/21 1411   04/21/21 1000  vancomycin (VANCOREADY) IVPB 1500 mg/300 mL        04/21/21 0948 04/21/21 1443   04/21/21 0945  ceFEPIme (MAXIPIME) 2 g in sodium chloride 0.9 % 100  mL IVPB         04/21/21 0936 04/21/21 1114         Devices    LINES / TUBES:      Continuous Infusions:  vancomycin       Objective: Vitals:   04/29/21 0445 04/29/21 1617 04/29/21 2032 04/30/21 0615  BP:  123/82 118/79 128/87  Pulse:  82 96 80  Resp:  17 (!) 24 17  Temp:  98.9 F (37.2 C) 99.1 F (37.3 C) 97.8 F (36.6 C)  TempSrc:  Oral Oral Oral  SpO2:  91% 92% 100%  Weight: 75.5 kg   75 kg  Height:        Intake/Output Summary (Last 24 hours) at 04/30/2021 0843 Last data filed at 04/30/2021 6226 Gross per 24 hour  Intake 802.31 ml  Output 200 ml  Net 602.31 ml   Filed Weights   04/28/21 0642 04/29/21 0445 04/30/21 0615  Weight: 76.7 kg 75.5 kg 75 kg    Examination:  General: A/O x4, No acute respiratory distress Eyes: negative scleral hemorrhage, negative anisocoria, negative icterus ENT: Negative Runny nose, negative gingival bleeding, Neck:  Negative scars, masses, torticollis, lymphadenopathy, JVD Lungs: Clear to auscultation bilaterally without wheezes or crackles Cardiovascular: Regular rate and rhythm without murmur gallop or rub normal S1 and S2 Abdomen: negative abdominal pain, nondistended, positive soft, bowel sounds, no rebound, no ascites, no appreciable mass Extremities: BLE dry gangrene LEFT>>> RIGHT Skin: Negative rashes, lesions, ulcers Psychiatric: Unable to fully evaluate as patient is minimally cooperative. Central nervous system:  Cranial nerves II through XII intact, tongue/uvula midline, all extremities muscle strength 5/5, sensation intact throughout, negative dysarthria, negative expressive aphasia, negative receptive aphasia.  .     Data Reviewed: Care during the described time interval was provided by me .  I have reviewed this patient's available data, including medical history, events of note, physical examination, and all test results as part of my evaluation.  CBC: Recent Labs  Lab 04/25/21 2121 04/27/21 0339 04/29/21 0224  04/30/21 0415  WBC 9.8 9.5  --  10.1  NEUTROABS 6.9 6.4  --  7.1  HGB 8.4* 7.6* 7.7* 8.7*  HCT 28.4* 25.7* 26.7* 29.1*  MCV 95.3 95.5  --  94.5  PLT 296 297  --  333   Basic Metabolic Panel: Recent Labs  Lab 04/25/21 0818 04/26/21 1409 04/27/21 0339 04/28/21 0242 04/29/21 0224 04/30/21 0415  NA 144 142 143 140 143 143  K 3.5 3.3* 3.1* 4.3 3.3* 3.1*  CL 112* 114* 113* 113* 111 113*  CO2 19* 21* 22 21* 25 23  GLUCOSE 126* 171* 129* 153* 153* 146*  BUN 14 14 13 13 12 10   CREATININE 1.64* 1.65* 1.69* 1.65* 1.50* 1.48*  CALCIUM 8.9 8.7* 8.8* 8.6* 8.8* 8.7*  MG 1.8  --   --   --  1.6* 2.1   GFR: Estimated Creatinine Clearance: 39.3 mL/min (A) (by C-G formula based on SCr of 1.48 mg/dL (H)). Liver Function Tests: No results for input(s): AST, ALT, ALKPHOS, BILITOT, PROT, ALBUMIN in the last 168 hours. No results for input(s): LIPASE, AMYLASE in the last 168 hours. No results for input(s): AMMONIA in the last 168 hours. Coagulation Profile: No results for input(s): INR, PROTIME in the last 168 hours. Cardiac Enzymes: No results for input(s): CKTOTAL, CKMB, CKMBINDEX, TROPONINI in the last 168 hours. BNP (last 3 results) No results for input(s): PROBNP in the last 8760 hours. HbA1C: No results for input(s): HGBA1C  in the last 72 hours. CBG: Recent Labs  Lab 04/29/21 1130 04/29/21 1616 04/29/21 2124 04/30/21 0305 04/30/21 0609  GLUCAP 130* 115* 167* 142* 129*   Lipid Profile: No results for input(s): CHOL, HDL, LDLCALC, TRIG, CHOLHDL, LDLDIRECT in the last 72 hours. Thyroid Function Tests: No results for input(s): TSH, T4TOTAL, FREET4, T3FREE, THYROIDAB in the last 72 hours. Anemia Panel: No results for input(s): VITAMINB12, FOLATE, FERRITIN, TIBC, IRON, RETICCTPCT in the last 72 hours. Sepsis Labs: No results for input(s): PROCALCITON, LATICACIDVEN in the last 168 hours.  Recent Results (from the past 240 hour(s))  Blood culture (routine x 2)     Status: Abnormal    Collection Time: 04/21/21 10:19 AM   Specimen: BLOOD  Result Value Ref Range Status   Specimen Description   Final    BLOOD RIGHT ANTECUBITAL Performed at [facility], [address] [place], [place] [address]    Special Requests   Final    BOTTLES DRAWN AEROBIC AND ANAEROBIC Blood Culture adequate volume Performed at [facility], [address], [place], [place] [address]    Culture  Setup Time   Final    ANAEROBIC BOTTLE ONLY GRAM POSITIVE COCCI Gram Stain Report Called to,Read Back By and Verified With: THOMAS D. AT [place] ON [id number] AT 1508 BY THOMPSON S. CRITICAL RESULT CALLED TO, READ BACK BY AND VERIFIED WITH: T,DANG PHARMD @1842  04/23/21 EB GRAM POSITIVE COCCI AEROBIC BOTTLE ONLY INA HAKA,RN@1858  04/24/2021 KAY Performed at [facility], [address], [place], [place] [address]    Culture STAPHYLOCOCCUS CAPITIS (A)  Final   Report Status 04/26/2021 FINAL  Final   Organism ID, Bacteria STAPHYLOCOCCUS CAPITIS  Final      Susceptibility   Staphylococcus capitis - MIC*    CIPROFLOXACIN 4 RESISTANT Resistant     ERYTHROMYCIN >=8 RESISTANT Resistant     GENTAMICIN <=0.5 SENSITIVE Sensitive     OXACILLIN 2 RESISTANT Resistant     TETRACYCLINE <=1 SENSITIVE Sensitive     VANCOMYCIN <=0.5 SENSITIVE Sensitive     TRIMETH/SULFA <=10 SENSITIVE Sensitive     CLINDAMYCIN INTERMEDIATE Intermediate     RIFAMPIN <=0.5 SENSITIVE Sensitive     Inducible Clindamycin NEGATIVE Sensitive     * STAPHYLOCOCCUS CAPITIS  Blood culture (routine x 2)     Status: Abnormal   Collection Time: 04/21/21 10:19 AM   Specimen: BLOOD  Result Value Ref Range Status   Specimen Description   Final    BLOOD BLOOD RIGHT ARM Performed at [facility], [address] [place], [place] [address]    Special Requests   Final    BOTTLES DRAWN AEROBIC AND ANAEROBIC Blood Culture adequate volume Performed at [facility], [address] [place], [place] [address]    Culture  Setup Time   Final    ANAEROBIC  BOTTLE ONLY GRAM POSITIVE COCCI Gram Stain Report Called to,Read Back By and Verified With: [place] [id number] BY THOMPSON S. AT 1508 Performed at [facility], [address], [place], [place] [address]    Culture (A)  Final    STAPHYLOCOCCUS CAPITIS SUSCEPTIBILITIES PERFORMED ON PREVIOUS CULTURE WITHIN THE LAST 5 DAYS. Performed at [facility], [place] [address] [place], [place] [address]    Report Status 04/26/2021 FINAL  Final  Blood Culture ID Panel (Reflexed)     Status: Abnormal   Collection Time: 04/21/21 10:19 AM  Result Value Ref Range Status   Enterococcus faecalis NOT DETECTED NOT  DETECTED Final   Enterococcus Faecium NOT DETECTED NOT DETECTED Final   Listeria monocytogenes NOT DETECTED NOT DETECTED Final   Staphylococcus species DETECTED (A) NOT DETECTED Final    Comment: CRITICAL RESULT CALLED TO, READ BACK BY AND VERIFIED WITH: T,DANG PHARMD @1842  04/23/21 EB    Staphylococcus aureus (BCID) NOT DETECTED NOT DETECTED Final   Staphylococcus epidermidis NOT DETECTED NOT DETECTED Final   Staphylococcus lugdunensis NOT DETECTED NOT DETECTED Final   Streptococcus species NOT DETECTED NOT DETECTED Final   Streptococcus agalactiae NOT DETECTED NOT DETECTED Final   Streptococcus pneumoniae NOT DETECTED NOT DETECTED Final   Streptococcus pyogenes NOT DETECTED NOT DETECTED Final   A.calcoaceticus-baumannii NOT DETECTED NOT DETECTED Final   Bacteroides fragilis NOT DETECTED NOT DETECTED Final   Enterobacterales NOT DETECTED NOT DETECTED Final   Enterobacter cloacae complex NOT DETECTED NOT DETECTED Final   Escherichia coli NOT DETECTED NOT DETECTED Final   Klebsiella aerogenes NOT DETECTED NOT DETECTED Final   Klebsiella oxytoca NOT DETECTED NOT DETECTED Final   Klebsiella pneumoniae NOT DETECTED NOT DETECTED Final   Proteus species NOT DETECTED NOT DETECTED Final   Salmonella species NOT DETECTED NOT DETECTED Final   Serratia marcescens NOT DETECTED NOT DETECTED  Final   Haemophilus influenzae NOT DETECTED NOT DETECTED Final   Neisseria meningitidis NOT DETECTED NOT DETECTED Final   Pseudomonas aeruginosa NOT DETECTED NOT DETECTED Final   Stenotrophomonas maltophilia NOT DETECTED NOT DETECTED Final   Candida albicans NOT DETECTED NOT DETECTED Final   Candida auris NOT DETECTED NOT DETECTED Final   Candida glabrata NOT DETECTED NOT DETECTED Final   Candida krusei NOT DETECTED NOT DETECTED Final   Candida parapsilosis NOT DETECTED NOT DETECTED Final   Candida tropicalis NOT DETECTED NOT DETECTED Final   Cryptococcus neoformans/gattii NOT DETECTED NOT DETECTED Final    Comment: Performed at [facility], [address] [place], [place] [address]  Resp Panel by RT-PCR (Flu A&B, Covid) Nasopharyngeal Swab     Status: None   Collection Time: 04/21/21 10:41 AM   Specimen: Nasopharyngeal Swab; Nasopharyngeal(NP) swabs in vial transport medium  Result Value Ref Range Status   SARS Coronavirus 2 by RT PCR NEGATIVE NEGATIVE Final    Comment: (NOTE) SARS-CoV-2 target nucleic acids are NOT DETECTED.  The SARS-CoV-2 RNA is generally detectable in upper respiratory specimens during the acute phase of infection. The lowest concentration of SARS-CoV-2 viral copies this assay can detect is 138 copies/mL. A negative result does not preclude SARS-Cov-2 infection and should not be used as the sole basis for treatment or other patient management decisions. A negative result may occur with  improper specimen collection/handling, submission of specimen other than nasopharyngeal swab, presence of viral mutation(s) within the areas targeted by this assay, and inadequate number of viral copies(<138 copies/mL). A negative result must be combined with clinical observations, patient history, and epidemiological information. The expected result is Negative.  Fact Sheet for Patients:  EntrepreneurPulse.com.au  Fact Sheet for Healthcare Providers:   IncredibleEmployment.be  This test is no t yet approved or cleared by the [place] FDA and  has been authorized for detection and/or diagnosis of SARS-CoV-2 by FDA under an Emergency Use Authorization (EUA). This EUA will remain  in effect (meaning this test can be used) for the duration of the COVID-19 declaration under Section 564(b)(1) of the Act, 21 U.S.C.section 360bbb-3(b)(1), unless the authorization is terminated  or revoked sooner.       Influenza A by PCR NEGATIVE NEGATIVE  Final   Influenza B by PCR NEGATIVE NEGATIVE Final    Comment: (NOTE) The Xpert Xpress SARS-CoV-2/FLU/RSV plus assay is intended as an aid in the diagnosis of influenza from Nasopharyngeal swab specimens and should not be used as a sole basis for treatment. Nasal washings and aspirates are unacceptable for Xpert Xpress SARS-CoV-2/FLU/RSV testing.  Fact Sheet for Patients: EntrepreneurPulse.com.au  Fact Sheet for Healthcare Providers: IncredibleEmployment.be  This test is not yet approved or cleared by the [place] FDA and has been authorized for detection and/or diagnosis of SARS-CoV-2 by FDA under an Emergency Use Authorization (EUA). This EUA will remain in effect (meaning this test can be used) for the duration of the COVID-19 declaration under Section 564(b)(1) of the Act, 21 U.S.C. section 360bbb-3(b)(1), unless the authorization is terminated or revoked.  Performed at [facility], [address] [place], [place] [address]   Culture, blood (Routine X 2) w Reflex to ID Panel     Status: None   Collection Time: 04/25/21  8:18 AM   Specimen: BLOOD  Result Value Ref Range Status   Specimen Description BLOOD RIGHT ANTECUBITAL  Final   Special Requests   Final    BOTTLES DRAWN AEROBIC ONLY Blood Culture results may not be optimal due to an inadequate volume of blood received in culture bottles   Culture   Final    NO GROWTH 5  DAYS Performed at [facility], [place] [address] [place], [place] [address]    Report Status 04/30/2021 FINAL  Final  Culture, blood (Routine X 2) w Reflex to ID Panel     Status: None   Collection Time: 04/25/21  8:18 AM   Specimen: BLOOD RIGHT HAND  Result Value Ref Range Status   Specimen Description BLOOD RIGHT HAND  Final   Special Requests   Final    BOTTLES DRAWN AEROBIC ONLY Blood Culture results may not be optimal due to an inadequate volume of blood received in culture bottles   Culture   Final    NO GROWTH 5 DAYS Performed at [facility], [place] [address], [place], [place] [address]    Report Status 04/30/2021 FINAL  Final         Radiology Studies: No results found.      Scheduled Meds:  amLODipine  10 mg Oral Daily   aspirin EC  81 mg Oral Daily   DULoxetine  60 mg Oral Daily   enoxaparin (LOVENOX) injection  75 mg Subcutaneous Q12H   insulin aspart  0-5 Units Subcutaneous QHS   insulin aspart  0-9 Units Subcutaneous TID WC   insulin aspart  3 Units Subcutaneous TID WC   levETIRAcetam  750 mg Oral BID   metoprolol tartrate  50 mg Oral BID   oxybutynin  5 mg Oral QHS   pantoprazole  40 mg Oral Daily   potassium chloride  20 mEq Oral Once   QUEtiapine  25 mg Oral QHS   rosuvastatin  10 mg Oral Daily   traZODone  50 mg Oral QHS   zinc sulfate  220 mg Oral Daily   Continuous Infusions:  vancomycin       LOS: 9 days    Time spent:40 min    Isabellarose Kope, Geraldo Docker, MD [facility]   If 7PM-7AM, please contact night-coverage 04/30/2021, 8:43 AM

## 2021-04-30 NOTE — Anesthesia Procedure Notes (Signed)
Procedure Name: MAC Date/Time: 04/30/2021 2:46 PM Performed by: Barrington Ellison, CRNA Pre-anesthesia Checklist: Patient identified, Emergency Drugs available, Suction available, Patient being monitored and Timeout performed Patient Re-evaluated:Patient Re-evaluated prior to induction Oxygen Delivery Method: Simple face mask

## 2021-05-01 ENCOUNTER — Encounter (HOSPITAL_COMMUNITY): Payer: Self-pay | Admitting: Vascular Surgery

## 2021-05-01 ENCOUNTER — Encounter (HOSPITAL_COMMUNITY): Payer: Medicare Other

## 2021-05-01 DIAGNOSIS — A419 Sepsis, unspecified organism: Secondary | ICD-10-CM | POA: Diagnosis not present

## 2021-05-01 DIAGNOSIS — J9611 Chronic respiratory failure with hypoxia: Secondary | ICD-10-CM | POA: Diagnosis not present

## 2021-05-01 DIAGNOSIS — I70263 Atherosclerosis of native arteries of extremities with gangrene, bilateral legs: Secondary | ICD-10-CM | POA: Diagnosis not present

## 2021-05-01 DIAGNOSIS — I739 Peripheral vascular disease, unspecified: Secondary | ICD-10-CM | POA: Diagnosis not present

## 2021-05-01 DIAGNOSIS — I4891 Unspecified atrial fibrillation: Secondary | ICD-10-CM | POA: Diagnosis not present

## 2021-05-01 LAB — IRON AND TIBC
Iron: 30 ug/dL (ref 28–170)
Saturation Ratios: 26 % (ref 10.4–31.8)
TIBC: 115 ug/dL — ABNORMAL LOW (ref 250–450)
UIBC: 85 ug/dL

## 2021-05-01 LAB — CBC WITH DIFFERENTIAL/PLATELET
Abs Immature Granulocytes: 0.07 10*3/uL (ref 0.00–0.07)
Basophils Absolute: 0.1 10*3/uL (ref 0.0–0.1)
Basophils Relative: 1 %
Eosinophils Absolute: 0.2 10*3/uL (ref 0.0–0.5)
Eosinophils Relative: 2 %
HCT: 24.7 % — ABNORMAL LOW (ref 36.0–46.0)
Hemoglobin: 7.3 g/dL — ABNORMAL LOW (ref 12.0–15.0)
Immature Granulocytes: 1 %
Lymphocytes Relative: 16 %
Lymphs Abs: 1.5 10*3/uL (ref 0.7–4.0)
MCH: 28.2 pg (ref 26.0–34.0)
MCHC: 29.6 g/dL — ABNORMAL LOW (ref 30.0–36.0)
MCV: 95.4 fL (ref 80.0–100.0)
Monocytes Absolute: 0.5 10*3/uL (ref 0.1–1.0)
Monocytes Relative: 6 %
Neutro Abs: 7 10*3/uL (ref 1.7–7.7)
Neutrophils Relative %: 74 %
Platelets: 281 10*3/uL (ref 150–400)
RBC: 2.59 MIL/uL — ABNORMAL LOW (ref 3.87–5.11)
RDW: 17.2 % — ABNORMAL HIGH (ref 11.5–15.5)
WBC: 9.5 10*3/uL (ref 4.0–10.5)
nRBC: 0.2 % (ref 0.0–0.2)

## 2021-05-01 LAB — COMPREHENSIVE METABOLIC PANEL
ALT: 8 U/L (ref 0–44)
AST: 8 U/L — ABNORMAL LOW (ref 15–41)
Albumin: 1.5 g/dL — ABNORMAL LOW (ref 3.5–5.0)
Alkaline Phosphatase: 70 U/L (ref 38–126)
Anion gap: 6 (ref 5–15)
BUN: 11 mg/dL (ref 6–20)
CO2: 23 mmol/L (ref 22–32)
Calcium: 8.6 mg/dL — ABNORMAL LOW (ref 8.9–10.3)
Chloride: 113 mmol/L — ABNORMAL HIGH (ref 98–111)
Creatinine, Ser: 1.41 mg/dL — ABNORMAL HIGH (ref 0.44–1.00)
GFR, Estimated: 44 mL/min — ABNORMAL LOW (ref >60)
Glucose, Bld: 110 mg/dL — ABNORMAL HIGH (ref 70–99)
Potassium: 4 mmol/L (ref 3.5–5.1)
Sodium: 142 mmol/L (ref 135–145)
Total Bilirubin: 0.9 mg/dL (ref 0.3–1.2)
Total Protein: 5.2 g/dL — ABNORMAL LOW (ref 6.5–8.1)

## 2021-05-01 LAB — RETICULOCYTES
Immature Retic Fract: 35.3 % — ABNORMAL HIGH (ref 2.3–15.9)
RBC.: 2.56 MIL/uL — ABNORMAL LOW (ref 3.87–5.11)
Retic Count, Absolute: 87.8 10*3/uL (ref 19.0–186.0)
Retic Ct Pct: 3.4 % — ABNORMAL HIGH (ref 0.4–3.1)

## 2021-05-01 LAB — GLUCOSE, CAPILLARY
Glucose-Capillary: 103 mg/dL — ABNORMAL HIGH (ref 70–99)
Glucose-Capillary: 99 mg/dL (ref 70–99)
Glucose-Capillary: 89 mg/dL (ref 70–99)
Glucose-Capillary: 85 mg/dL (ref 70–99)

## 2021-05-01 LAB — VITAMIN B12: Vitamin B-12: 1001 pg/mL — ABNORMAL HIGH (ref 180–914)

## 2021-05-01 LAB — FOLATE: Folate: 14.6 ng/mL (ref >5.9)

## 2021-05-01 LAB — PHOSPHORUS: Phosphorus: 2.5 mg/dL (ref 2.5–4.6)

## 2021-05-01 LAB — FERRITIN: Ferritin: 352 ng/mL — ABNORMAL HIGH (ref 11–307)

## 2021-05-01 LAB — MAGNESIUM: Magnesium: 1.8 mg/dL (ref 1.7–2.4)

## 2021-05-01 NOTE — Consult Note (Signed)
[facility] for Infectious Disease    Date of Admission:  04/21/2021     Total days of antibiotics 10               Reason for Consult: Coag Negative Staph Bacteremia / Cellulitis  Referring Provider: Dr. Sherral Hammers Primary Care Provider: Caprice Renshaw, MD   ASSESSMENT:  [name] is a 55 y/o female with peripheral vascular disease, Type 2 diabetes and tobacco use with bilateral critical limb ischemia with multiple gangrenous ulcers s/p vascular intervention. Was found to have Staphylococcus capitis bacteremia on admission in 1 bottle of each culture. Suspect this is likely a contaminant as the bottles are labeled right arm and right antecubital. Repeat cultures on 7/1 finalized without growth. Has received 10 days of antibiotic therapy thus far and will continue with vancomycin while in the hospital and change to doxycycline 100 mg bid at discharge with treatment through 7/14. Lesions appear most consistent with her peripheral vascular disease which is complicated by poorly controlled Type 2 diabetes and tobacco use. Vascular Surgery notes wounds will likely heal with local wound care and will follow with Dr. Sharol Given as well. Amputation certainly remains a possibility.   ID will sign off. Please re-consult if needed.  PLAN:  Continue vancomycin while inpatient and change to oral doxycycline at discharge with treatment through 7/14.  Wound care per Dr. Sharol Given and Dr. Stanford Breed. Diabetes management and tobacco cessation per primary team.    Principal Problem:   Sepsis ([place]) Active Problems:   Essential hypertension   INSOMNIA   Medical non-compliance   Poor mobility   Lower extremity weakness   HTN (hypertension), malignant   Hypokalemia   Diabetic neuropathy (HCC)   CKD (chronic kidney disease) stage 3, GFR 30-59 ml/min (HCC)   Severe recurrent major depression without psychotic features (HCC)   COPD (chronic obstructive pulmonary disease) (HCC)   At high risk for falls   PAF  (paroxysmal atrial fibrillation) (HCC)   Atrial fibrillation with RVR (HCC)   Seizures (HCC)   Sacral ulcer, limited to breakdown of skin (HCC)   Reflux esophagitis   Multiple duodenal ulcers   Chronic respiratory failure with hypoxia (HCC)   Hypomagnesemia   PVD (peripheral vascular disease) (HCC)   Atherosclerosis of native artery of both legs with gangrene (HCC)   MDD (major depressive disorder), recurrent episode, moderate (HCC)    amLODipine  10 mg Oral Daily   aspirin EC  81 mg Oral Daily   DULoxetine  60 mg Oral Daily   enoxaparin (LOVENOX) injection  75 mg Subcutaneous Q12H   insulin aspart  0-5 Units Subcutaneous QHS   insulin aspart  0-9 Units Subcutaneous TID WC   insulin aspart  3 Units Subcutaneous TID WC   levETIRAcetam  750 mg Oral BID   metoprolol tartrate  50 mg Oral BID   oxybutynin  5 mg Oral QHS   pantoprazole  40 mg Oral Daily   QUEtiapine  25 mg Oral QHS   rosuvastatin  10 mg Oral Daily   traZODone  50 mg Oral QHS   zinc sulfate  220 mg Oral Daily     HPI: [name] is a 54 y.o. female with previous medical history of severe peripheral vascular disease, paroxysmal atrial fibrillation on anticoagulation, hypertension, Type 2 diabetes, sleep apnea, congestive heart failure and CKD presenting from her nursing facility at [facility] with bilateral leg pain and decreased PO intake.  [name] was previously hospitalized  from 04/01/21-04/04/21 following a fall when she was pushing a wheelchair. She had sores on her legs that were weeping more with increased pain. X-rays of hip and pelvis were unremarkable. Initially treated cellulitis of the right lower leg with antibiotics however 3 days of treatment and was left AMA with 10 days of doxycycline. Had follow up with Dr. Sharol Given on 6/16 with painful ischemic ulcers with need for re-vacularization and if unable to receive re-vascularization would need bilateral above knee amputations. Seen later that day by Dr. Oneida Alar  with agreement for need for amputation.  Admitted on 6/27 with worsening pain. Started on cefepime and vancomycin with concern for sepsis and infection. Vascular Surgery performed aortogram of the bilateral extremities with right superficial femoral / popliteal artery stenting. Completed left side lower extremity angiogram with second order cannulation on 04/30/21.   [name] has been afebrile since admission with no leukocytosis. Initial blood cultures with Staphylococcus capitus in 1 bottle of each set of cultures. Repeat blood cultures from 7/1 have finalized without growth to date. Has received 10 days of antibiotic treatment with vancomycin (6/27-7/1; 7/5 - ), cefepime (6/27-6/30), and doxycycline (7/3-7/4). ID has been asked for antibiotic recommendations.   [name] continues to have pain in her bilateral lower extremities with a severity of the sheet lying on it at times is uncomfortable. Waiting on requested pain medication. Denies any fevers or chills.   Review of Systems: Review of Systems  Constitutional:  Negative for chills, fever and weight loss.  Respiratory:  Negative for cough, shortness of breath and wheezing.   Cardiovascular:  Negative for chest pain and leg swelling.  Gastrointestinal:  Negative for abdominal pain, constipation, diarrhea, nausea and vomiting.  Musculoskeletal:        Bilateral lower extremity ulcers and pain.   Skin:  Negative for rash.    Past Medical History:  Diagnosis Date   Alcohol use    Ankle fracture, lateral malleolus, closed 2013   Anxiety    Aortic atherosclerosis ([place]) 03/10/2021   Breast mass, left 2013   CHF (congestive heart failure) ([place])    a. EF 20-25% by echo in 05/2016 with cath showing normal cors b. EF 50-55% in 07/2020 c. 01/2021: EF at 55-60% with moderate LVH   Chronic anemia    CKD (chronic kidney disease), stage III (HCC)    Cocaine abuse (HCC)    COPD (chronic obstructive pulmonary disease) ([place])    Diabetes mellitus,  type 2 (HCC)    Diabetic Charcot foot ([place])    Essential hypertension    History of cardiomyopathy    History of GI bleed 2011   Hyperlipidemia    Noncompliance    Obesity    PAF (paroxysmal atrial fibrillation) (HCC)    Panic attacks    PAT (paroxysmal atrial tachycardia) (HCC)    Previously on Amiodarone   Seizures ([place]) 10/01/2020   Sleep apnea    Not on CPAP   Stroke ([place]) 2018   Tobacco abuse    Urinary incontinence     Social History   Tobacco Use   Smoking status: Some Days    Packs/day: 0.25    Years: 20.00    Pack years: 5.00    Types: Cigarettes    Last attempt to quit: 06/03/2017    Years since quitting: 3.9   Smokeless tobacco: Never  Vaping Use   Vaping Use: Never used  Substance Use Topics   Alcohol use: Not Currently    Alcohol/week: 0.0  standard drinks    Comment: Quit in 2017   Drug use: Not Currently    Frequency: 3.0 times per week    Types: Marijuana, Cocaine    Comment: none since August 2018    Family History  Problem Relation Age of Onset   Hypertension Mother    Heart attack Mother    Hypertension Father        CABG    Hypertension Sister    Hypertension Brother    Hypertension Sister    Cancer Sister        breast    Arthritis Other    Cancer Other    Diabetes Other    Asthma Other    Hypertension Daughter    Hypertension Son     No Known Allergies  OBJECTIVE: Blood pressure 101/85, pulse 82, temperature 98.9 F (37.2 C), temperature source Oral, resp. rate 17, height 5\' 1"  (1.549 m), weight 74.7 kg, last menstrual period 05/19/2016, SpO2 93 %.  Physical Exam Constitutional:      General: She is not in acute distress.    Appearance: She is well-developed.     Comments: Lying in bed with head of bed elevated; pleasant.   Cardiovascular:     Rate and Rhythm: Normal rate and regular rhythm.     Heart sounds: Normal heart sounds.  Pulmonary:     Effort: Pulmonary effort is normal.     Breath sounds: Normal breath sounds.   Skin:    Comments: Bilateral lower extremities with multiple gangrenous ulcers. Legs are mildly warm and very sensitive to touch.   Neurological:     Mental Status: She is alert and oriented to person, place, and time.  Psychiatric:        Mood and Affect: Mood normal.    Lab Results Lab Results  Component Value Date   WBC 9.5 05/01/2021   HGB 7.3 (L) 05/01/2021   HCT 24.7 (L) 05/01/2021   MCV 95.4 05/01/2021   PLT 281 05/01/2021    Lab Results  Component Value Date   CREATININE 1.41 (H) 05/01/2021   BUN 11 05/01/2021   NA 142 05/01/2021   K 4.0 05/01/2021   CL 113 (H) 05/01/2021   CO2 23 05/01/2021    Lab Results  Component Value Date   ALT 8 05/01/2021   AST 8 (L) 05/01/2021   ALKPHOS 70 05/01/2021   BILITOT 0.9 05/01/2021     Microbiology: Recent Results (from the past 240 hour(s))  Culture, blood (Routine X 2) w Reflex to ID Panel     Status: None   Collection Time: 04/25/21  8:18 AM   Specimen: BLOOD  Result Value Ref Range Status   Specimen Description BLOOD RIGHT ANTECUBITAL  Final   Special Requests   Final    BOTTLES DRAWN AEROBIC ONLY Blood Culture results may not be optimal due to an inadequate volume of blood received in culture bottles   Culture   Final    NO GROWTH 5 DAYS Performed at [facility], [place] [address] [place], [place] [address]    Report Status 04/30/2021 FINAL  Final  Culture, blood (Routine X 2) w Reflex to ID Panel     Status: None   Collection Time: 04/25/21  8:18 AM   Specimen: BLOOD RIGHT HAND  Result Value Ref Range Status   Specimen Description BLOOD RIGHT HAND  Final   Special Requests   Final    BOTTLES DRAWN AEROBIC ONLY Blood Culture  results may not be optimal due to an inadequate volume of blood received in culture bottles   Culture   Final    NO GROWTH 5 DAYS Performed at [facility], [place] [address], [place], [place] [address]    Report Status 04/30/2021 FINAL  Final     Terri Piedra, NP [name]  05/01/2021  3:51 PM

## 2021-05-01 NOTE — Progress Notes (Signed)
PROGRESS NOTE    [name]  PYP:[id number] DOB: Jul 09, 1966 DOA: 04/21/2021 PCP: Caprice Renshaw, MD     Brief Narrative:   [name] was admitted with the working diagnosis of sepsis due to bilateral lower extremity leg ulcerations with cellulitis and dry gangrene. Staphylococcus cutis bacteremia.    [name] PMHx MDD, Seizures, tobacco abuse, COPD, severe peripheral vascular disease, paroxysmal atrial fibrillation, HTN, Dyslipidemia, DM Type 2 with complications, Diabetic Neuropathy,  Chronic Diastolic CHF, CKD stage III,  Presented with bilateral leg pain.  She was noted to have decreased p.o. intake at her nursing facility.  She does have chronic ulcerated lesions bilateral legs.  On her initial physical examination temperature 98.1, heart rate 150, respiratory rate 22, heart rate 90, blood pressure 116/92, oxygen saturation 93%, her lungs were clear to auscultation bilaterally, heart S1-S2, present, rhythmic, soft abdomen, positive pressure ulcers ventral side of distal lower extremities.  Stage IV pressure ulcers with necrotic skin.    Patient was placed on broad-spectrum antibiotic therapy with vancomycin and cefepime. Vascular surgery and orthopedic were consulted.   Patient was scheduled to have left common femoral artery access aortogram and right lower extremity angiography but patient declined to proceed on the Cath Lab table. This was discussed with the sister, and procedure was reattempted successfully under general anesthesia.    06/29 Sp right superficial femoral and popliteal artery stenting   Positive blood cultures for staph cutis  #2/2    Patient with poor IV acces, has declined PICC line several times.    Poor oral intake and continue to decline IV, change antibiotic therapy to oral doxycycline.   07/05 has new IV and Vancomycin resumed. Plan for left leg angioplasty on 07/06.      Subjective: 7/7 afebrile overnight A/O x4, cooperative.  In a good  mood.   Assessment & Plan: Covid vaccination; vaccinated 2/3   Principal Problem:   Sepsis ([place]) Active Problems:   Essential hypertension   INSOMNIA   Medical non-compliance   Poor mobility   Lower extremity weakness   HTN (hypertension), malignant   Hypokalemia   Diabetic neuropathy (HCC)   CKD (chronic kidney disease) stage 3, GFR 30-59 ml/min (HCC)   Severe recurrent major depression without psychotic features (HCC)   COPD (chronic obstructive pulmonary disease) (HCC)   At high risk for falls   PAF (paroxysmal atrial fibrillation) (HCC)   Atrial fibrillation with RVR (HCC)   Seizures (HCC)   Sacral ulcer, limited to breakdown of skin (HCC)   Reflux esophagitis   Multiple duodenal ulcers   Chronic respiratory failure with hypoxia (HCC)   Hypomagnesemia   PVD (peripheral vascular disease) (HCC)   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 (Present on admission)/ Staphylococcus bacteremia. Blood cultures #2/2 positive for staphylococcus cutis Sp right superficial femoral and popliteal artery stenting.   Follow blood cultures continue with no growth. Staphylococcus cutis sensitive to vancomycin resistant to cipro and erythromycin. Echocardiogram with no evident vegetation.   Patient had 2 days of oral antibiotic therapy due to no IV access. Now that patient has a functioning IV access will resume vancomycin. Plan for left leg  revascularization tomorrow, then will get final recommendations from orthopedics. Depending of this recommendations will plan duration of antibiotic therapy.    Severe PVD     DM  type II uncontrolled with complication: DM nephropathy, PVD Lab Results  Component Value Date   GLUCAP 117 (H) 05/02/2021   GLUCAP 96 05/02/2021   GLUCAP 101 (H) 05/02/2021   GLUCAP 85 05/01/2021   GLUCAP 89 05/01/2021     Chronic respiratory failure with hypoxia - Continue with supplemental 02 per [place] Resolved.     Paroxysmal atrial fibrillation. Continue with metoprolol for rate control and apixaban for  anticoagulation     Essential HTN   Dyslipidemia.   On amlodipine and metoprolol for blood pressure control. On statin    AKI on CKD stage 3b(baseline ~Cr 1.5)  Lab Results  Component Value Date   CREATININE 1.41 (H) 05/01/2021   CREATININE 1.48 (H) 04/30/2021   CREATININE 1.50 (H) 04/29/2021   CREATININE 1.65 (H) 04/28/2021   CREATININE 1.69 (H) 04/27/2021  -Baseline  Non anion gap metabolic acidosis.  -Resolved   Depression.   -Patient with severe mood swings, declining medical treatment at times. -Per Psychiatry patient and son not concerned with decision making/actions, declines any further intervention -continue with lorazepam, duloxetine, trazodone, keppra. Add quetiapine.     Multiple pressure injuries -See below  Pressure Injury 12/03/20 Perineum Posterior Stage 1 -  Intact skin with non-blanchable redness of a localized area usually over a bony prominence. (Active)  12/03/20 2210  Location: Perineum  Location Orientation: Posterior  Staging: Stage 1 -  Intact skin with non-blanchable redness of a localized area usually over a bony prominence.  Wound Description (Comments):   Present on Admission: Yes     Pressure Injury 12/04/20 Coccyx Right;Medial Stage 2 -  Partial thickness loss of dermis presenting as a shallow open injury with a red, pink wound bed without slough. 1cm x 0.5cm area to the right of previously healed pressure ulcer (Active)  12/04/20 0800  Location: Coccyx  Location Orientation: Right;Medial  Staging: Stage 2 -  Partial thickness  loss of dermis presenting as a shallow open injury with a red, pink wound bed without slough.  Wound Description (Comments): 1cm x 0.5cm area to the right of previously healed pressure ulcer  Present on Admission: Yes    Hypokalemia - Potassium goal> 4 - 7/6 potassium IV 50 mEq  Anemia unspecified - 7/6 anemia panel pending Lab Results  Component Value Date   HGB 7.3 (L) 05/01/2021   HGB 8.7 (L) 04/30/2021   HGB 7.7 (L) 04/29/2021   HGB 7.6 (L) 04/27/2021   HGB 8.4 (L) 04/25/2021  -7/7 hemoglobin dropped significantly overnight may be secondary to her surgery. - Transfer for hemoglobin<7 - Occult blood pending  Obese (BMI 31.24 kg/m)     DVT prophylaxis:  Code Status:  Family Communication: 7/6 son [name] at bedside for discussion of plan of care all questions answered Status is: Inpatient    Dispo: The patient is from: Home              Anticipated d/c is to: SNF vs home              Anticipated d/c date is: Per vascular surgery              Patient currently unstable      Consultants:  Vascular surgery    Procedures/Significant Events:  7/1 Echocardiogram limited:Left Ventricle: Left ventricular ejection fraction, by estimation, is  60  to 65%. The left ventricle has normal function. The left ventricle has no  regional wall motion abnormalities. The left ventricular internal cavity  size was normal in size.   Right Ventricle: There is mildly elevated pulmonary artery systolic  pressure. The tricuspid regurgitant velocity is 2.90 m/s, and with an  assumed right atrial pressure of 3 mmHg, the estimated right ventricular  systolic pressure is 66.4 mmHg.   Pericardium: There is no evidence of pericardial effusion.   Mitral Valve: The mitral valve is grossly normal. Mild mitral valve  regurgitation. There is no evidence of mitral valve vegetation.   Tricuspid Valve: The tricuspid valve is grossly normal. Tricuspid valve  regurgitation is mild. There is no  evidence of tricuspid valve vegetation.   Aortic Valve: The aortic valve is normal in structure. Aortic valve  regurgitation is not visualized. There is no evidence of aortic valve  vegetation.   Pulmonic Valve: The pulmonic valve was grossly normal. Pulmonic valve  regurgitation is mild.  7/6 s/p1) US guided right common femoral access 2) left lower extremity angiogram with second order cannulation  I have personally reviewed and interpreted all radiology studies and my findings are as above.   VENTILATOR SETTINGS:    Cultures 6/27 SARS coronavirus negative 6/27 influenza A/B negative 6/27 blood RIGHT AC positive staph capitis 6/27 blood RIGHT arm positive staph capitis 7/1 blood RIGHT AC negative final 7/1 blood RIGHT hand negative final   Antimicrobials: Anti-infectives (From admission, onward)    Start     Ordered Stop   04/30/21 2200  vancomycin (VANCOREADY) IVPB 750 mg/150 mL        04/29/21 1453     04/29/21 1545  vancomycin (VANCOREADY) IVPB 1500 mg/300 mL        04/29/21 1453 04/29/21 2314   04/27/21 1700  vancomycin (VANCOREADY) IVPB 1000 mg/200 mL  Status:  Discontinued        04/26/21 1159 04/27/21 1303   04/27/21 1700  vancomycin (VANCOCIN) IVPB 1000 mg/200 mL premix  Status:  Discontinued        04/27/21 1303 04/27/21 1331   04/27/21 1430  doxycycline (VIBRA-TABS) tablet 100 mg  Status:  Discontinued        04/27/21 1331 04/29/21 1433   04/24/21 1600  vancomycin (VANCOREADY) IVPB 1250 mg/250 mL  Status:  Discontinued        04/24/21 1519 04/26/21 1159   04/22/21 1200  vancomycin (VANCOREADY) IVPB 1250 mg/250 mL  Status:  Discontinued        04/21/21 1232 04/24/21 1519   04/21/21 2200  ceFEPIme (MAXIPIME) 2 g in sodium chloride 0.9 % 100 mL IVPB  Status:  Discontinued        04/21/21 1249 04/24/21 1411   04/21/21 1000  vancomycin (VANCOREADY) IVPB 1500 mg/300 mL        04/21/21 0948 04/21/21 1443   04/21/21 0945  ceFEPIme (MAXIPIME) 2 g in sodium chloride  0.9 % 100 mL IVPB        04/21/21 0936 04/21/21 1114         Devices    LINES / TUBES:      Continuous Infusions:  lactated ringers 10 mL/hr at 04/30/21 1255   vancomycin 750 mg (04/30/21 2310)     Objective: Vitals:   04/30/21 1650 04/30/21 1920 05/01/21 0606 05/01/21 1230  BP: (!) 123/97 119/84 114/83 118/88  Pulse: 93 78 88 81  Resp: 18 15 20 20   Temp: 97.8 F (36.6  C) 98.1 F (36.7 C) 99.2 F (37.3 C) 98.9 F (37.2 C)  TempSrc: Oral Oral Oral Oral  SpO2: 95% 100% 92% 99%  Weight:   74.7 kg   Height:        Intake/Output Summary (Last 24 hours) at 05/01/2021 1322 Last data filed at 05/01/2021 1031 Gross per 24 hour  Intake 1403.15 ml  Output 751 ml  Net 652.15 ml    Filed Weights   04/30/21 0615 04/30/21 1236 05/01/21 0606  Weight: 75 kg 75 kg 74.7 kg    Examination:  General: A/O x4, No acute respiratory distress Eyes: negative scleral hemorrhage, negative anisocoria, negative icterus ENT: Negative Runny nose, negative gingival bleeding, Neck:  Negative scars, masses, torticollis, lymphadenopathy, JVD Lungs: Clear to auscultation bilaterally without wheezes or crackles Cardiovascular: Regular rate and rhythm without murmur gallop or rub normal S1 and S2 Abdomen: negative abdominal pain, nondistended, positive soft, bowel sounds, no rebound, no ascites, no appreciable mass Extremities: BLE dry gangrene LEFT>>> RIGHT.  PT/DP pulses +1 bilateral Skin: Negative rashes, lesions, ulcers Psychiatric: Unable to fully evaluate as patient is minimally cooperative. Central nervous system:  Cranial nerves II through XII intact, tongue/uvula midline, all extremities muscle strength 5/5, sensation intact throughout, negative dysarthria, negative expressive aphasia, negative receptive aphasia.  .     Data Reviewed: Care during the described time interval was provided by me .  I have reviewed this patient's available data, including medical history, events of  note, physical examination, and all test results as part of my evaluation.  CBC: Recent Labs  Lab 04/25/21 2121 04/27/21 0339 04/29/21 0224 04/30/21 0415 05/01/21 0404  WBC 9.8 9.5  --  10.1 9.5  NEUTROABS 6.9 6.4  --  7.1 7.0  HGB 8.4* 7.6* 7.7* 8.7* 7.3*  HCT 28.4* 25.7* 26.7* 29.1* 24.7*  MCV 95.3 95.5  --  94.5 95.4  PLT 296 297  --  261 867    Basic Metabolic Panel: Recent Labs  Lab 04/25/21 0818 04/26/21 1409 04/27/21 0339 04/28/21 0242 04/29/21 0224 04/30/21 0415 05/01/21 0404  NA 144   < > 143 140 143 143 142  K 3.5   < > 3.1* 4.3 3.3* 3.1* 4.0  CL 112*   < > 113* 113* 111 113* 113*  CO2 19*   < > 22 21* 25 23 23   GLUCOSE 126*   < > 129* 153* 153* 146* 110*  BUN 14   < > 13 13 12 10 11   CREATININE 1.64*   < > 1.69* 1.65* 1.50* 1.48* 1.41*  CALCIUM 8.9   < > 8.8* 8.6* 8.8* 8.7* 8.6*  MG 1.8  --   --   --  1.6* 2.1 1.8  PHOS  --   --   --   --   --   --  2.5   < > = values in this interval not displayed.    GFR: Estimated Creatinine Clearance: 41.2 mL/min (A) (by C-G formula based on SCr of 1.41 mg/dL (H)). Liver Function Tests: Recent Labs  Lab 05/01/21 0404  AST 8*  ALT 8  ALKPHOS 70  BILITOT 0.9  PROT 5.2*  ALBUMIN 1.5*   No results for input(s): LIPASE, AMYLASE in the last 168 hours. No results for input(s): AMMONIA in the last 168 hours. Coagulation Profile: No results for input(s): INR, PROTIME in the last 168 hours. Cardiac Enzymes: No results for input(s): CKTOTAL, CKMB, CKMBINDEX, TROPONINI in the last 168 hours. BNP (last 3 results)  No results for input(s): PROBNP in the last 8760 hours. HbA1C: No results for input(s): HGBA1C in the last 72 hours. CBG: Recent Labs  Lab 04/30/21 1544 04/30/21 1649 04/30/21 2100 05/01/21 0607 05/01/21 1225  GLUCAP 92 105* 115* 103* 99    Lipid Profile: No results for input(s): CHOL, HDL, LDLCALC, TRIG, CHOLHDL, LDLDIRECT in the last 72 hours. Thyroid Function Tests: No results for input(s):  TSH, T4TOTAL, FREET4, T3FREE, THYROIDAB in the last 72 hours. Anemia Panel: Recent Labs    05/01/21 0404  VITAMINB12 1,001*  FOLATE 14.6  FERRITIN 352*  TIBC 115*  IRON 30  RETICCTPCT 3.4*   Sepsis Labs: No results for input(s): PROCALCITON, LATICACIDVEN in the last 168 hours.  Recent Results (from the past 240 hour(s))  Culture, blood (Routine X 2) w Reflex to ID Panel     Status: None   Collection Time: 04/25/21  8:18 AM   Specimen: BLOOD  Result Value Ref Range Status   Specimen Description BLOOD RIGHT ANTECUBITAL  Final   Special Requests   Final    BOTTLES DRAWN AEROBIC ONLY Blood Culture results may not be optimal due to an inadequate volume of blood received in culture bottles   Culture   Final    NO GROWTH 5 DAYS Performed at [facility], [place] [address], [place], [place] [address]    Report Status 04/30/2021 FINAL  Final  Culture, blood (Routine X 2) w Reflex to ID Panel     Status: None   Collection Time: 04/25/21  8:18 AM   Specimen: BLOOD RIGHT HAND  Result Value Ref Range Status   Specimen Description BLOOD RIGHT HAND  Final   Special Requests   Final    BOTTLES DRAWN AEROBIC ONLY Blood Culture results may not be optimal due to an inadequate volume of blood received in culture bottles   Culture   Final    NO GROWTH 5 DAYS Performed at [facility], [place] [address] [place],  [address]    Report Status 04/30/2021 FINAL  Final          Radiology Studies: HYBRID OR IMAGING ([place])  Result Date: 04/30/2021 There is no interpretation for this exam.  This order is for images obtained during a surgical procedure.  Please See "Surgeries" Tab for more information regarding the procedure.        Scheduled Meds:  amLODipine  10 mg Oral Daily   aspirin EC  81 mg Oral Daily   DULoxetine  60 mg Oral Daily   enoxaparin (LOVENOX) injection  75 mg Subcutaneous Q12H   insulin aspart  0-5 Units Subcutaneous QHS   insulin aspart  0-9 Units  Subcutaneous TID WC   insulin aspart  3 Units Subcutaneous TID WC   levETIRAcetam  750 mg Oral BID   metoprolol tartrate  50 mg Oral BID   oxybutynin  5 mg Oral QHS   pantoprazole  40 mg Oral Daily   QUEtiapine  25 mg Oral QHS   rosuvastatin  10 mg Oral Daily   traZODone  50 mg Oral QHS   zinc sulfate  220 mg Oral Daily   Continuous Infusions:  lactated ringers 10 mL/hr at 04/30/21 1255   vancomycin 750 mg (04/30/21 2310)     LOS: 10 days    Time spent:40 min    Corbin Falck, Geraldo Docker, MD [facility]   If 7PM-7AM, please contact night-coverage 05/01/2021, 1:22 PM

## 2021-05-01 NOTE — Discharge Instructions (Addendum)
[facility]  Discharge Instructions  Lower Extremity Angiogram; Angioplasty/Stenting  Please refer to the following instructions for your post-procedure care. Your surgeon or physician assistant will discuss any changes with you.  Activity  Avoid lifting more than 8 pounds (1 gallons of milk) for 5 days after your procedure. You may walk as much as you can tolerate. It's OK to drive after 72 hours.  Bathing/Showering  You may shower the day after your procedure. If you have a bandage, you may remove it at 24- 48 hours. Clean your incision site with mild soap and water. Pat the area dry with a clean towel.  Diet  Resume your pre-procedure diet. There are no special food restrictions following this procedure. All patients with peripheral vascular disease should follow a low fat/low cholesterol diet. In order to heal from your surgery, it is CRITICAL to get adequate nutrition. Your body requires vitamins, minerals, and protein. Vegetables are the best source of vitamins and minerals. Vegetables also provide the perfect balance of protein. Processed food has little nutritional value, so try to avoid this.  Medications  Resume taking all of your medications unless your doctor tells you not to. If your incision is causing pain, you may take over-the-counter pain relievers such as acetaminophen (Tylenol)  Follow Up  Follow up will be arranged at the time of your procedure. You may have an office visit scheduled or may be scheduled for surgery. Ask your surgeon if you have any questions.  Please call us immediately for any of the following conditions: Severe or worsening pain your legs or feet at rest or with walking. Increased pain, redness, drainage at your groin puncture site. Fever of 101 degrees or higher. If you have any mild or slow bleeding from your puncture site: lie down, apply firm constant pressure over the area with a piece of gauze or a clean  wash cloth for 30 minutes- no peeking!, call 911 right away if you are still bleeding after 30 minutes, or if the bleeding is heavy and unmanageable.  Reduce your risk factors of vascular disease:  Stop smoking. If you would like help call [facility] at 1-800-QUIT-NOW (229)332-2160) or [facility] at (754) 757-0399. Manage your cholesterol Maintain a desired weight Control your diabetes Keep your blood pressure down  If you have any questions, please call the office at [phone] ======================================================  Information on my medicine - XARELTO (Rivaroxaban)  This medication education was reviewed with me or my healthcare representative as part of my discharge preparation.     Why was Xarelto prescribed for you? Xarelto was prescribed for you to reduce the risk of a blood clot forming that can cause a stroke if you have a medical condition called atrial fibrillation (a type of irregular heartbeat).  What do you need to know about xarelto ? Take your Xarelto ONCE DAILY at the same time every day with your evening meal. If you have difficulty swallowing the tablet whole, you may crush it and mix in applesauce just prior to taking your dose.  Take Xarelto exactly as prescribed by your doctor and DO NOT stop taking Xarelto without talking to the doctor who prescribed the medication.  Stopping without other stroke prevention medication to take the place of Xarelto may increase your risk of developing a clot that causes a stroke.  Refill your prescription before you run out.  After discharge, you should have regular check-up appointments with your healthcare provider that is prescribing your Xarelto.  In the future your dose may need to be changed if your kidney function or weight changes by a significant amount.  What do you do if you miss a dose? If you are taking Xarelto ONCE DAILY and you miss a dose, take it as soon as you remember on the same day  then continue your regularly scheduled once daily regimen the next day. Do not take two doses of Xarelto at the same time or on the same day.   Important Safety Information A possible side effect of Xarelto is bleeding. You should call your healthcare provider right away if you experience any of the following: Bleeding from an injury or your nose that does not stop. Unusual colored urine (red or dark brown) or unusual colored stools (red or black). Unusual bruising for unknown reasons. A serious fall or if you hit your head (even if there is no bleeding).  Some medicines may interact with Xarelto and might increase your risk of bleeding while on Xarelto. To help avoid this, consult your healthcare provider or pharmacist prior to using any new prescription or non-prescription medications, including herbals, vitamins, non-steroidal anti-inflammatory drugs (NSAIDs) and supplements.  This website has more information on Xarelto: [email or web address]

## 2021-05-01 NOTE — Progress Notes (Signed)
Patient placed on tele monitoring per orders.

## 2021-05-01 NOTE — Plan of Care (Signed)
  Problem: Clinical Measurements: Goal: Will remain free from infection Outcome: Progressing   Problem: Safety: Goal: Ability to remain free from injury will improve Outcome: Progressing   

## 2021-05-01 NOTE — Progress Notes (Addendum)
  Progress Note  05/01/2021 7:54 AM 1 Day Post-Op  Subjective:  says she is having pain in both legs   Vitals:   04/30/21 1920 05/01/21 0606  BP: 119/84 114/83  Pulse: 78 88  Resp: 15 20  Temp: 98.1 F (36.7 C) 99.2 F (37.3 C)  SpO2: 100% 92%   Physical Exam: Cardiac:  regular Lungs:  non labored Extremities:  2+ femoral pulses bilaterally, right common femoral access site is clean, dry and intact without swelling or hematoma. Both leg wounds stable and dry. Feet warm and well perfused Neurologic: alert and oriented  CBC    Component Value Date/Time   WBC 9.5 05/01/2021 0404   RBC 2.59 (L) 05/01/2021 0404   RBC 2.56 (L) 05/01/2021 0404   HGB 7.3 (L) 05/01/2021 0404   HCT 24.7 (L) 05/01/2021 0404   PLT 281 05/01/2021 0404   MCV 95.4 05/01/2021 0404   MCH 28.2 05/01/2021 0404   MCHC 29.6 (L) 05/01/2021 0404   RDW 17.2 (H) 05/01/2021 0404   LYMPHSABS 1.5 05/01/2021 0404   MONOABS 0.5 05/01/2021 0404   EOSABS 0.2 05/01/2021 0404   BASOSABS 0.1 05/01/2021 0404    BMET    Component Value Date/Time   NA 142 05/01/2021 0404   K 4.0 05/01/2021 0404   CL 113 (H) 05/01/2021 0404   CO2 23 05/01/2021 0404   GLUCOSE 110 (H) 05/01/2021 0404   BUN 11 05/01/2021 0404   CREATININE 1.41 (H) 05/01/2021 0404   CREATININE 1.56 (H) 07/26/2018 1513   CALCIUM 8.6 (L) 05/01/2021 0404   CALCIUM 8.9 09/13/2014 0703   GFRNONAA 44 (L) 05/01/2021 0404   GFRNONAA 37 (L) 07/26/2018 1513   GFRAA 54 (L) 05/09/2019 0956   GFRAA 43 (L) 07/26/2018 1513    INR    Component Value Date/Time   INR 1.7 (H) 04/21/2021 1019     Intake/Output Summary (Last 24 hours) at 05/01/2021 0754 Last data filed at 05/01/2021 4098 Gross per 24 hour  Intake 1243.15 ml  Output 1151 ml  Net 92.15 ml   Assessment/Plan:  55 y.o. female is s/p  1) US guided right common femoral access 2) left lower extremity angiogram with second order cannulation     She was found to have inline flow to her left lower  extremity. No intervention was indicated.  She previously underwent right lower extremity revascularization on 04/23/21 with SFA and popliteal stenting Optimize local wound care to bilateral lower extremities Continue Aspirin and statin From vascular standpoint she is okay for d/c she can follow up with [name] on an outpatient basis. She will have follow up arranged in 1 month with ABI and RLE arterial duplex  Karoline Caldwell, PA-C [facility] [phone] 05/01/2021 7:54 AM  I have interviewed the patient and examined the patient. I agree with the findings by the PA.  Dr. Sharol Given was following her leg wounds.  Vascular surgery will sign off.  We have arranged follow-up of her arterial status in 1 month.  Call sooner if problems.  Gae Gallop, MD

## 2021-05-01 NOTE — Care Management Important Message (Signed)
Important Message  Patient Details  Name: [name] MRN: [id number] Date of Birth: 11/13/1965   Medicare Important Message Given:  Yes     Shelda Altes 05/01/2021, 9:18 AM

## 2021-05-01 NOTE — Progress Notes (Signed)
Received patient awake,alert/orientedx4 and able to verbalize needs. VSS.NAD noted; respirations even on room air. Movement/sensation to extremities noted. Multiple wounds/ ischemia noted to bilateral lower extremities. Patient refusing any wound care at this time. Patient oriented to room and floor. Whiteboard updated. All safety measures in place and personal belongings within reach.

## 2021-05-01 NOTE — TOC Progression Note (Signed)
Transition of Care [facility]) - Progression Note    Patient Details  Name: [name] MRN: [id number] Date of Birth: 01-Dec-1965  Transition of Care [facility]) CM/SW Contact  Reece Agar, [place] Phone Number: 05/01/2021, 11:15 AM  Clinical Narrative:    CSW following for DC planning . Pt not medically ready for DC, pt from [facility].        Expected Discharge Plan and Services                                                 Social Determinants of Health (SDOH) Interventions    Readmission Risk Interventions Readmission Risk Prevention Plan 10/31/2020 08/27/2020  Transportation Screening Complete Complete  Medication Review Press photographer) Complete Complete  PCP or Specialist appointment within 3-5 days of discharge - Complete  HRI or [place] - Complete  SW Recovery Care/Counseling Consult Complete Complete  Palliative Care Screening Not Applicable Not Applicable  Skilled Nursing Facility Complete Complete  Some recent data might be hidden

## 2021-05-02 DIAGNOSIS — I4891 Unspecified atrial fibrillation: Secondary | ICD-10-CM | POA: Diagnosis not present

## 2021-05-02 DIAGNOSIS — A419 Sepsis, unspecified organism: Secondary | ICD-10-CM | POA: Diagnosis not present

## 2021-05-02 DIAGNOSIS — J9611 Chronic respiratory failure with hypoxia: Secondary | ICD-10-CM | POA: Diagnosis not present

## 2021-05-02 DIAGNOSIS — I70263 Atherosclerosis of native arteries of extremities with gangrene, bilateral legs: Secondary | ICD-10-CM | POA: Diagnosis not present

## 2021-05-02 LAB — GLUCOSE, CAPILLARY
Glucose-Capillary: 101 mg/dL — ABNORMAL HIGH (ref 70–99)
Glucose-Capillary: 96 mg/dL (ref 70–99)
Glucose-Capillary: 117 mg/dL — ABNORMAL HIGH (ref 70–99)
Glucose-Capillary: 123 mg/dL — ABNORMAL HIGH (ref 70–99)

## 2021-05-02 MED ORDER — HALOPERIDOL LACTATE 5 MG/ML IJ SOLN
2.0000 mg | Freq: Three times a day (TID) | INTRAMUSCULAR | Status: DC
  Administered 2021-05-02 – 2021-05-03 (×3): 2 mg via INTRAVENOUS
  Filled 2021-05-02 (×3): qty 1

## 2021-05-02 NOTE — Progress Notes (Signed)
Pharmacy Antibiotic Note  [name] is a 55 y.o. female admitted on 04/21/2021 with bilateral leg pain.  Blood culture also grew Staph capitus.  ID planning on 14 days since last negative Bcx, unless discharged before and then switch to PO doxycycline. Renal function is stable.   Plan: Vanc 750mg  IV Q24H for AUC 482 using SCr 1.5 Monitor renal fxn, clinical progress, vanc AUC at Css  Height: 5\' 1"  (154.9 cm) Weight: 74.7 kg (164 lb 10.9 oz) IBW/kg (Calculated) : 47.8  Temp (24hrs), Avg:98.5 F (36.9 C), Min:97.6 F (36.4 C), Max:98.9 F (37.2 C)  Recent Labs  Lab 04/25/21 2121 04/26/21 1409 04/27/21 0339 04/28/21 0242 04/29/21 0224 04/30/21 0415 05/01/21 0404  WBC 9.8  --  9.5  --   --  10.1 9.5  CREATININE  --    < > 1.69* 1.65* 1.50* 1.48* 1.41*   < > = values in this interval not displayed.     Estimated Creatinine Clearance: 41.2 mL/min (A) (by C-G formula based on SCr of 1.41 mg/dL (H)).    No Known Allergies  Vanc 6/27 >> 7/1, restart 7/5 >>  Cefepime 6/27 >> 6/30 Doxy 7/4 >> 7/5 (loss IV access)   6/27 BCx - 2/4 staph capitis from two dif bottles possible same location (ox resistant) 7/1 BCx - NGTD  Varney Daily, PharmD PGY1 [place] Resident  Please check AMION for all [facility] pharmacy phone numbers After 10:00 PM call main pharmacy [phone]

## 2021-05-02 NOTE — Progress Notes (Signed)
Patient's son was in during shift and verbalized many concerns re patient. Son verbalized concerns that patient was not eating and wondered why and wanted to know what the doctor and staff doing about that. Pt reported that she did not have any appetite and then requested some [employer]; which pt's son went to get. Pt only ate few bites of burrito from [employer] and refused the rest. Son also wanted a copy of patient's medical records. This Probation officer advised son that patient's medical records had to come from the medical record office and that we did not provide that on the unit. Pt's son also wanted wound care performed to Pt's legs daily to "prevent infection to her legs", stated son. This Probation officer advised son that dressing change was performed with drainage from legs and this writer had no other orders to perform dsg change daily as son requested and that dressing had been changed by previous shift Therapist, sports. Dr. Sherral Hammers made aware.

## 2021-05-02 NOTE — Progress Notes (Signed)
PROGRESS NOTE    [name]  IWO:[id number] DOB: 11-17-1965 DOA: 04/21/2021 PCP: Caprice Renshaw, MD     Brief Narrative:   [name] was admitted with the working diagnosis of sepsis due to bilateral lower extremity leg ulcerations with cellulitis and dry gangrene. Staphylococcus cutis bacteremia.    [name] PMHx MDD, Seizures, tobacco abuse, COPD, severe peripheral vascular disease, paroxysmal atrial fibrillation, HTN, Dyslipidemia, DM Type 2 with complications, Diabetic Neuropathy,  Chronic Diastolic CHF, CKD stage III,  Presented with bilateral leg pain.  She was noted to have decreased p.o. intake at her nursing facility.  She does have chronic ulcerated lesions bilateral legs.  On her initial physical examination temperature 98.1, heart rate 150, respiratory rate 22, heart rate 90, blood pressure 116/92, oxygen saturation 93%, her lungs were clear to auscultation bilaterally, heart S1-S2, present, rhythmic, soft abdomen, positive pressure ulcers ventral side of distal lower extremities.  Stage IV pressure ulcers with necrotic skin.    Patient was placed on broad-spectrum antibiotic therapy with vancomycin and cefepime. Vascular surgery and orthopedic were consulted.   Patient was scheduled to have left common femoral artery access aortogram and right lower extremity angiography but patient declined to proceed on the Cath Lab table. This was discussed with the sister, and procedure was reattempted successfully under general anesthesia.    06/29 Sp right superficial femoral and popliteal artery stenting   Positive blood cultures for staph cutis  #2/2    Patient with poor IV acces, has declined PICC line several times.    Poor oral intake and continue to decline IV, change antibiotic therapy to oral doxycycline.   07/05 has new IV and Vancomycin resumed. Plan for left leg angioplasty on 07/06.      Subjective: 7/8 afebrile overnight, overnight per RN notes Patient  refusing any wound care.  A/O x4, patient yelling and cursing.  Whenever I ask her questions she curses at me and states get away from her and I do not make any decisions for her.  RN is in the room and request that she rotate to take pressure off her side patient refuses to cooperate pulls her down overhead and tells her to get away.   Assessment & Plan: Covid vaccination; vaccinated 2/3   Principal Problem:   Sepsis ([place]) Active Problems:   Essential hypertension   INSOMNIA   Medical non-compliance   Poor mobility   Lower extremity weakness   HTN (hypertension), malignant   Hypokalemia   Diabetic neuropathy (HCC)   CKD (chronic kidney disease) stage 3, GFR 30-59 ml/min (HCC)   Severe recurrent major depression without psychotic features (HCC)   COPD (chronic obstructive pulmonary disease) (HCC)   At high risk for falls   PAF (paroxysmal atrial fibrillation) (HCC)   Atrial fibrillation with RVR (HCC)   Seizures (HCC)   Sacral ulcer, limited to breakdown of skin (HCC)   Reflux esophagitis   Multiple duodenal ulcers   Chronic respiratory failure with hypoxia (HCC)   Hypomagnesemia   PVD (peripheral vascular disease) (HCC)   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  Counseled patient that if she does not allow [name]  to examine her and change dressings she could lose her legs, again patient curses at myself and the nurse and tells [name] to leave her alone and God was the one who fixed her leg.  Sepsis due to bilateral lower extremity cellulitis with dry gangrene, in the setting of severe PVD. (Present on admission) -7/8 appears sepsis  physiology resolving.  Will need to monitor closely when patient allows   Staphylococcus bacteremia.  positive staph capitis -Blood cultures #2/2 positive for staphylococcus cutis -S/p right superficial femoral and popliteal artery stenting. -7/1 repeat set of cultures negative final -Echocardiogram with no evident vegetation. -Patient had 2 days of oral antibiotic therapy due to no IV access. Now that patient has a functioning IV access will resume vancomycin. - Per ID Continue vancomycin while inpatient and change to oral doxycycline at discharge with treatment through 7/14. Wound care per Dr. Sharol Given and Dr. Stanford Breed.  Severe PVD -Multifactorial long-term uncontrolled diabetic, essential HTN, poor overall health.  -Maximize treatment above issues   DM type II uncontrolled with complication: DM nephropathy, PVD -5/29 hemoglobin A1c= 11.1 Lab Results  Component Value Date   GLUCAP 117 (H) 05/02/2021   GLUCAP 96 05/02/2021   GLUCAP 101 (H) 05/02/2021   GLUCAP 85 05/01/2021   GLUCAP 89 05/01/2021     Chronic respiratory failure with hypoxia - Continue with supplemental 02 per [place] Resolved.     Paroxysmal atrial fibrillation. -Continue with metoprolol for rate control and apixaban for  anticoagulation     Essential HTN  -See A. Fib  Dyslipidemia.   On amlodipine and metoprolol for blood pressure control. On statin    AKI on CKD stage 3b(baseline ~Cr 1.5)  Lab Results  Component Value Date   CREATININE 1.41 (H) 05/01/2021   CREATININE 1.48 (H) 04/30/2021   CREATININE 1.50 (H) 04/29/2021   CREATININE 1.65 (H) 04/28/2021   CREATININE 1.69 (H) 04/27/2021  -Baseline  Non anion gap metabolic acidosis.  -Resolved   Depression.   -Patient with severe mood swings, declining medical treatment at times. -Per Psychiatry patient and son not concerned with decision making/actions, declines any further intervention -continue with lorazepam, duloxetine, trazodone, keppra. Add  quetiapine.    Paranoia - 7/8 Haldol 2 mg TID.  Once overall paranoia/belligerence controlled we will attempt to change to Seroquel   Multiple pressure injuries -See below  Pressure Injury 12/03/20 Perineum Posterior Stage 1 -  Intact skin with non-blanchable redness of a localized area usually over a bony prominence. (Active)  12/03/20 2210  Location: Perineum  Location Orientation: Posterior  Staging: Stage 1 -  Intact skin with non-blanchable redness of a localized area usually over a bony prominence.  Wound Description (Comments):   Present on Admission: Yes     Pressure Injury 12/04/20 Coccyx Right;Medial Stage 2 -  Partial thickness loss of dermis presenting as a shallow open injury with a red, pink wound bed without slough. 1cm x 0.5cm area to the right of previously healed pressure ulcer (Active)  12/04/20 0800  Location: Coccyx  Location Orientation: Right;Medial  Staging: Stage 2 -  Partial thickness loss of dermis presenting as a shallow open injury with a red, pink wound bed without slough.  Wound Description (Comments): 1cm x 0.5cm area to the right of previously healed pressure ulcer  Present on Admission: Yes  -7/8 awaiting air bed  Hypokalemia - Potassium goal> 4 - 7/6 potassium IV 50 mEq  Anemia unspecified - 7/6 anemia panel pending Lab Results  Component Value Date  HGB 7.3 (L) 05/01/2021   HGB 8.7 (L) 04/30/2021   HGB 7.7 (L) 04/29/2021   HGB 7.6 (L) 04/27/2021   HGB 8.4 (L) 04/25/2021  -7/7 hemoglobin dropped significantly overnight may be secondary to her surgery. - Transfer for hemoglobin<7 - Occult blood pending  Obese (BMI 31.24 kg/m)     DVT prophylaxis:  Code Status:  Family Communication: 7/6 son [name] at bedside for discussion of plan of care all questions answered Status is: Inpatient    Dispo: The patient is from: Home              Anticipated d/c is to: SNF vs home              Anticipated d/c date is: Per vascular surgery               Patient currently unstable      Consultants:  Vascular surgery    Procedures/Significant Events:  7/1 Echocardiogram limited:Left Ventricle: Left ventricular ejection fraction, by estimation, is 60  to 65%. The left ventricle has normal function. The left ventricle has no  regional wall motion abnormalities. The left ventricular internal cavity  size was normal in size.   Right Ventricle: There is mildly elevated pulmonary artery systolic  pressure. The tricuspid regurgitant velocity is 2.90 m/s, and with an  assumed right atrial pressure of 3 mmHg, the estimated right ventricular  systolic pressure is 16.1 mmHg.   Pericardium: There is no evidence of pericardial effusion.   Mitral Valve: The mitral valve is grossly normal. Mild mitral valve  regurgitation. There is no evidence of mitral valve vegetation.   Tricuspid Valve: The tricuspid valve is grossly normal. Tricuspid valve  regurgitation is mild. There is no evidence of tricuspid valve vegetation.   Aortic Valve: The aortic valve is normal in structure. Aortic valve  regurgitation is not visualized. There is no evidence of aortic valve  vegetation.   Pulmonic Valve: The pulmonic valve was grossly normal. Pulmonic valve  regurgitation is mild.  7/6 s/p1) US guided right common femoral access 2) left lower extremity angiogram with second order cannulation   I have personally reviewed and interpreted all radiology studies and my findings are as above.   VENTILATOR SETTINGS:    Cultures 6/27 SARS coronavirus negative 6/27 influenza A/B negative 6/27 blood RIGHT AC positive staph capitis 6/27 blood RIGHT arm positive staph capitis 7/1 blood RIGHT AC negative final 7/1 blood RIGHT hand negative final   Antimicrobials: Anti-infectives (From admission, onward)    Start     Ordered Stop   04/30/21 2200  vancomycin (VANCOREADY) IVPB 750 mg/150 mL        04/29/21 1453     04/29/21 1545  vancomycin  (VANCOREADY) IVPB 1500 mg/300 mL        04/29/21 1453 04/29/21 2314   04/27/21 1700  vancomycin (VANCOREADY) IVPB 1000 mg/200 mL  Status:  Discontinued        04/26/21 1159 04/27/21 1303   04/27/21 1700  vancomycin (VANCOCIN) IVPB 1000 mg/200 mL premix  Status:  Discontinued        04/27/21 1303 04/27/21 1331   04/27/21 1430  doxycycline (VIBRA-TABS) tablet 100 mg  Status:  Discontinued        04/27/21 1331 04/29/21 1433   04/24/21 1600  vancomycin (VANCOREADY) IVPB 1250 mg/250 mL  Status:  Discontinued        04/24/21 1519 04/26/21 1159   04/22/21 1200  vancomycin (VANCOREADY) IVPB 1250 mg/250 mL  Status:  Discontinued        04/21/21 1232 04/24/21 1519   04/21/21 2200  ceFEPIme (MAXIPIME) 2 g in sodium chloride 0.9 % 100 mL IVPB  Status:  Discontinued        04/21/21 1249 04/24/21 1411   04/21/21 1000  vancomycin (VANCOREADY) IVPB 1500 mg/300 mL        04/21/21 0948 04/21/21 1443   04/21/21 0945  ceFEPIme (MAXIPIME) 2 g in sodium chloride 0.9 % 100 mL IVPB        04/21/21 0936 04/21/21 1114         Devices    LINES / TUBES:      Continuous Infusions:  lactated ringers 10 mL/hr at 04/30/21 1255   vancomycin 750 mg (05/01/21 2347)     Objective: Vitals:   05/01/21 1532 05/01/21 2052 05/02/21 1010 05/02/21 1112  BP: 101/85 (!) 117/98 117/85 (!) 121/49  Pulse: 82 88 93 96  Resp: 17 17 17    Temp: 98.9 F (37.2 C) 97.6 F (36.4 C) 98.9 F (37.2 C)   TempSrc: Oral Oral Oral   SpO2: 93% 94% 96% 98%  Weight:      Height:        Intake/Output Summary (Last 24 hours) at 05/02/2021 1928 Last data filed at 05/02/2021 0427 Gross per 24 hour  Intake --  Output 400 ml  Net -400 ml    Filed Weights   04/30/21 0615 04/30/21 1236 05/01/21 0606  Weight: 75 kg 75 kg 74.7 kg    Examination:  General: A/O x4, No acute respiratory distress Eyes: negative scleral hemorrhage, negative anisocoria, negative icterus ENT: Negative Runny nose, negative gingival bleeding, Neck:   Negative scars, masses, torticollis, lymphadenopathy, JVD -Patient yelling and cursing at myself and RN will not allow further examination. .     Data Reviewed: Care during the described time interval was provided by me .  I have reviewed this patient's available data, including medical history, events of note, physical examination, and all test results as part of my evaluation.  CBC: Recent Labs  Lab 04/25/21 2121 04/27/21 0339 04/29/21 0224 04/30/21 0415 05/01/21 0404  WBC 9.8 9.5  --  10.1 9.5  NEUTROABS 6.9 6.4  --  7.1 7.0  HGB 8.4* 7.6* 7.7* 8.7* 7.3*  HCT 28.4* 25.7* 26.7* 29.1* 24.7*  MCV 95.3 95.5  --  94.5 95.4  PLT 296 297  --  261 509    Basic Metabolic Panel: Recent Labs  Lab 04/27/21 0339 04/28/21 0242 04/29/21 0224 04/30/21 0415 05/01/21 0404  NA 143 140 143 143 142  K 3.1* 4.3 3.3* 3.1* 4.0  CL 113* 113* 111 113* 113*  CO2 22 21* 25 23 23   GLUCOSE 129* 153* 153* 146* 110*  BUN 13 13 12 10 11   CREATININE 1.69* 1.65* 1.50* 1.48* 1.41*  CALCIUM 8.8* 8.6* 8.8* 8.7* 8.6*  MG  --   --  1.6* 2.1 1.8  PHOS  --   --   --   --  2.5    GFR: Estimated Creatinine Clearance: 41.2 mL/min (A) (by C-G formula based on SCr of 1.41 mg/dL (H)). Liver Function Tests: Recent Labs  Lab 05/01/21 0404  AST 8*  ALT 8  ALKPHOS 70  BILITOT 0.9  PROT 5.2*  ALBUMIN 1.5*    No results for input(s): LIPASE, AMYLASE in the last 168 hours. No results for input(s): AMMONIA in the last 168 hours. Coagulation Profile: No results for input(s): INR, PROTIME  in the last 168 hours. Cardiac Enzymes: No results for input(s): CKTOTAL, CKMB, CKMBINDEX, TROPONINI in the last 168 hours. BNP (last 3 results) No results for input(s): PROBNP in the last 8760 hours. HbA1C: No results for input(s): HGBA1C in the last 72 hours. CBG: Recent Labs  Lab 05/01/21 1656 05/01/21 2247 05/02/21 0425 05/02/21 1100 05/02/21 1815  GLUCAP 89 85 101* 96 117*    Lipid Profile: No results  for input(s): CHOL, HDL, LDLCALC, TRIG, CHOLHDL, LDLDIRECT in the last 72 hours. Thyroid Function Tests: No results for input(s): TSH, T4TOTAL, FREET4, T3FREE, THYROIDAB in the last 72 hours. Anemia Panel: Recent Labs    05/01/21 0404  VITAMINB12 1,001*  FOLATE 14.6  FERRITIN 352*  TIBC 115*  IRON 30  RETICCTPCT 3.4*    Sepsis Labs: No results for input(s): PROCALCITON, LATICACIDVEN in the last 168 hours.  Recent Results (from the past 240 hour(s))  Culture, blood (Routine X 2) w Reflex to ID Panel     Status: None   Collection Time: 04/25/21  8:18 AM   Specimen: BLOOD  Result Value Ref Range Status   Specimen Description BLOOD RIGHT ANTECUBITAL  Final   Special Requests   Final    BOTTLES DRAWN AEROBIC ONLY Blood Culture results may not be optimal due to an inadequate volume of blood received in culture bottles   Culture   Final    NO GROWTH 5 DAYS Performed at [facility], [place] [address], [place], [place] [address]    Report Status 04/30/2021 FINAL  Final  Culture, blood (Routine X 2) w Reflex to ID Panel     Status: None   Collection Time: 04/25/21  8:18 AM   Specimen: BLOOD RIGHT HAND  Result Value Ref Range Status   Specimen Description BLOOD RIGHT HAND  Final   Special Requests   Final    BOTTLES DRAWN AEROBIC ONLY Blood Culture results may not be optimal due to an inadequate volume of blood received in culture bottles   Culture   Final    NO GROWTH 5 DAYS Performed at [facility], [place] [address], [place],  [address]    Report Status 04/30/2021 FINAL  Final          Radiology Studies: No results found.      Scheduled Meds:  amLODipine  10 mg Oral Daily   aspirin EC  81 mg Oral Daily   DULoxetine  60 mg Oral Daily   enoxaparin (LOVENOX) injection  75 mg Subcutaneous Q12H   insulin aspart  0-5 Units Subcutaneous QHS   insulin aspart  0-9 Units Subcutaneous TID WC   insulin aspart  3 Units Subcutaneous TID WC   levETIRAcetam   750 mg Oral BID   metoprolol tartrate  50 mg Oral BID   oxybutynin  5 mg Oral QHS   pantoprazole  40 mg Oral Daily   QUEtiapine  25 mg Oral QHS   rosuvastatin  10 mg Oral Daily   traZODone  50 mg Oral QHS   zinc sulfate  220 mg Oral Daily   Continuous Infusions:  lactated ringers 10 mL/hr at 04/30/21 1255   vancomycin 750 mg (05/01/21 2347)     LOS: 11 days    Time spent:40 min    Farzad Tibbetts, Geraldo Docker, MD [facility]   If 7PM-7AM, please contact night-coverage 05/02/2021, 7:28 PM

## 2021-05-03 DIAGNOSIS — J9611 Chronic respiratory failure with hypoxia: Secondary | ICD-10-CM | POA: Diagnosis not present

## 2021-05-03 DIAGNOSIS — I70263 Atherosclerosis of native arteries of extremities with gangrene, bilateral legs: Secondary | ICD-10-CM | POA: Diagnosis not present

## 2021-05-03 DIAGNOSIS — A419 Sepsis, unspecified organism: Secondary | ICD-10-CM | POA: Diagnosis not present

## 2021-05-03 DIAGNOSIS — I4891 Unspecified atrial fibrillation: Secondary | ICD-10-CM | POA: Diagnosis not present

## 2021-05-03 LAB — CBC WITH DIFFERENTIAL/PLATELET
Abs Immature Granulocytes: 0.03 10*3/uL (ref 0.00–0.07)
Basophils Absolute: 0 10*3/uL (ref 0.0–0.1)
Basophils Relative: 0 %
Eosinophils Absolute: 0.2 10*3/uL (ref 0.0–0.5)
Eosinophils Relative: 2 %
HCT: 26 % — ABNORMAL LOW (ref 36.0–46.0)
Hemoglobin: 7.8 g/dL — ABNORMAL LOW (ref 12.0–15.0)
Immature Granulocytes: 0 %
Lymphocytes Relative: 19 %
Lymphs Abs: 1.7 10*3/uL (ref 0.7–4.0)
MCH: 28.2 pg (ref 26.0–34.0)
MCHC: 30 g/dL (ref 30.0–36.0)
MCV: 93.9 fL (ref 80.0–100.0)
Monocytes Absolute: 0.6 10*3/uL (ref 0.1–1.0)
Monocytes Relative: 7 %
Neutro Abs: 6.4 10*3/uL (ref 1.7–7.7)
Neutrophils Relative %: 72 %
Platelets: 259 10*3/uL (ref 150–400)
RBC: 2.77 MIL/uL — ABNORMAL LOW (ref 3.87–5.11)
RDW: 17.5 % — ABNORMAL HIGH (ref 11.5–15.5)
WBC: 8.9 10*3/uL (ref 4.0–10.5)
nRBC: 0 % (ref 0.0–0.2)

## 2021-05-03 LAB — COMPREHENSIVE METABOLIC PANEL
ALT: 8 U/L (ref 0–44)
AST: 10 U/L — ABNORMAL LOW (ref 15–41)
Albumin: 1.6 g/dL — ABNORMAL LOW (ref 3.5–5.0)
Alkaline Phosphatase: 76 U/L (ref 38–126)
Anion gap: 8 (ref 5–15)
BUN: 9 mg/dL (ref 6–20)
CO2: 25 mmol/L (ref 22–32)
Calcium: 8.5 mg/dL — ABNORMAL LOW (ref 8.9–10.3)
Chloride: 110 mmol/L (ref 98–111)
Creatinine, Ser: 1.2 mg/dL — ABNORMAL HIGH (ref 0.44–1.00)
GFR, Estimated: 53 mL/min — ABNORMAL LOW (ref >60)
Glucose, Bld: 157 mg/dL — ABNORMAL HIGH (ref 70–99)
Potassium: 3.6 mmol/L (ref 3.5–5.1)
Sodium: 143 mmol/L (ref 135–145)
Total Bilirubin: 0.5 mg/dL (ref 0.3–1.2)
Total Protein: 5.5 g/dL — ABNORMAL LOW (ref 6.5–8.1)

## 2021-05-03 LAB — GLUCOSE, CAPILLARY
Glucose-Capillary: 129 mg/dL — ABNORMAL HIGH (ref 70–99)
Glucose-Capillary: 209 mg/dL — ABNORMAL HIGH (ref 70–99)
Glucose-Capillary: 217 mg/dL — ABNORMAL HIGH (ref 70–99)
Glucose-Capillary: 125 mg/dL — ABNORMAL HIGH (ref 70–99)

## 2021-05-03 LAB — MAGNESIUM: Magnesium: 1.6 mg/dL — ABNORMAL LOW (ref 1.7–2.4)

## 2021-05-03 LAB — PHOSPHORUS: Phosphorus: 2.6 mg/dL (ref 2.5–4.6)

## 2021-05-03 MED ORDER — APIXABAN 5 MG PO TABS
5.0000 mg | ORAL_TABLET | Freq: Two times a day (BID) | ORAL | Status: DC
  Administered 2021-05-03 – 2021-05-05 (×6): 5 mg via ORAL
  Filled 2021-05-03 (×7): qty 1

## 2021-05-03 MED ORDER — LORAZEPAM 2 MG/ML IJ SOLN
2.0000 mg | Freq: Once | INTRAMUSCULAR | Status: AC
  Administered 2021-05-03: 2 mg via INTRAVENOUS
  Filled 2021-05-03: qty 1

## 2021-05-03 MED ORDER — LORAZEPAM 2 MG/ML IJ SOLN
2.0000 mg | Freq: Four times a day (QID) | INTRAMUSCULAR | Status: DC | PRN
  Administered 2021-05-04 – 2021-05-11 (×4): 2 mg via INTRAVENOUS
  Filled 2021-05-03 (×4): qty 1

## 2021-05-03 MED ORDER — QUETIAPINE FUMARATE 25 MG PO TABS
50.0000 mg | ORAL_TABLET | Freq: Every day | ORAL | Status: DC
  Administered 2021-05-03 – 2021-05-07 (×5): 50 mg via ORAL
  Filled 2021-05-03 (×5): qty 2

## 2021-05-03 MED ORDER — HALOPERIDOL LACTATE 5 MG/ML IJ SOLN
3.0000 mg | Freq: Four times a day (QID) | INTRAMUSCULAR | Status: DC
  Administered 2021-05-04 – 2021-05-05 (×5): 3 mg via INTRAVENOUS
  Filled 2021-05-03 (×7): qty 1

## 2021-05-03 MED ORDER — HALOPERIDOL LACTATE 5 MG/ML IJ SOLN: 2.0000 mg | Freq: Four times a day (QID) | INTRAMUSCULAR | Status: DC

## 2021-05-03 NOTE — Progress Notes (Signed)
Pt refused blood draw this AM. Nurse encouraged patient and educated patient but consistently refused.

## 2021-05-03 NOTE — Plan of Care (Signed)
  Problem: Pain Managment: Goal: General experience of comfort will improve Outcome: Progressing   Problem: Safety: Goal: Ability to remain free from injury will improve Outcome: Progressing   Problem: Skin Integrity: Goal: Risk for impaired skin integrity will decrease Outcome: Progressing   Problem: Education: Goal: Knowledge of General Education information will improve Description: Including pain rating scale, medication(s)/side effects and non-pharmacologic comfort measures Outcome: Progressing   

## 2021-05-03 NOTE — Progress Notes (Signed)
Late entry: Small sizes Stage II pressure ulcers noted to sacrum on 7/8. Areas cleaned with normal saline and allevy dsg applied to site. Pt turned to side. According to son, at bedside, patient has had these "sores",(stated son) for some time now. Patient's son reported that patient had ulcers at the nursing home and that they have been treating them at the nursing home.

## 2021-05-03 NOTE — Progress Notes (Signed)
PROGRESS NOTE    [name]  FMB:[id number] DOB: 07-25-1966 DOA: 04/21/2021 PCP: Caprice Renshaw, MD     Brief Narrative:   [name] was admitted with the working diagnosis of sepsis due to bilateral lower extremity leg ulcerations with cellulitis and dry gangrene. Staphylococcus cutis bacteremia.    [name] PMHx MDD, Seizures, tobacco abuse, COPD, severe peripheral vascular disease, paroxysmal atrial fibrillation, HTN, Dyslipidemia, DM Type 2 with complications, Diabetic Neuropathy,  Chronic Diastolic CHF, CKD stage III,  Presented with bilateral leg pain.  She was noted to have decreased p.o. intake at her nursing facility.  She does have chronic ulcerated lesions bilateral legs.  On her initial physical examination temperature 98.1, heart rate 150, respiratory rate 22, heart rate 90, blood pressure 116/92, oxygen saturation 93%, her lungs were clear to auscultation bilaterally, heart S1-S2, present, rhythmic, soft abdomen, positive pressure ulcers ventral side of distal lower extremities.  Stage IV pressure ulcers with necrotic skin.    Patient was placed on broad-spectrum antibiotic therapy with vancomycin and cefepime. Vascular surgery and orthopedic were consulted.   Patient was scheduled to have left common femoral artery access aortogram and right lower extremity angiography but patient declined to proceed on the Cath Lab table. This was discussed with the sister, and procedure was reattempted successfully under general anesthesia.    06/29 Sp right superficial femoral and popliteal artery stenting   Positive blood cultures for staph cutis  #2/2    Patient with poor IV acces, has declined PICC line several times.    Poor oral intake and continue to decline IV, change antibiotic therapy to oral doxycycline.   07/05 has new IV and Vancomycin resumed. Plan for left leg angioplasty on 07/06.      Subjective: 7/9 afebrile overnight.  Per RN notes patient continues to  refuse medication, food, dressing changes, and lab draws.     afebrile overnight, overnight per RN notes Patient refusing any wound care.  A/O x4, patient yelling and cursing.  Whenever I ask her questions she curses at me and states get away from her and I do not make any decisions for her.  RN is in the room and request that she rotate to take pressure off her side patient refuses to cooperate pulls her down overhead and tells her to get away.   Assessment & Plan: Covid vaccination; vaccinated 2/3   Principal Problem:   Sepsis ([place]) Active Problems:   Essential hypertension   INSOMNIA   Medical non-compliance   Poor mobility   Lower extremity weakness   HTN (hypertension), malignant   Hypokalemia   Diabetic neuropathy (HCC)   CKD (chronic kidney disease) stage 3, GFR 30-59 ml/min (HCC)   Severe recurrent major depression without psychotic features (HCC)   COPD (chronic obstructive pulmonary disease) (HCC)   At high risk for falls   PAF (paroxysmal atrial fibrillation) (HCC)   Atrial fibrillation with RVR (HCC)   Seizures (HCC)   Sacral ulcer, limited to breakdown of skin (HCC)   Reflux esophagitis   Multiple duodenal ulcers   Chronic respiratory failure with hypoxia (HCC)   Hypomagnesemia   PVD (peripheral vascular disease) (HCC)   Atherosclerosis of native artery of both legs with gangrene (HCC)   MDD (major depressive disorder), recurrent episode, moderate (HCC)  Sepsis: Bilateral lower extremity dry gangrene LEFT>>> RIGHT -7/6 s/p1) US guided right common femoral access 2) left lower extremity angiogram with second order cannulation   -7/8 refuses to allow me to  examine her, curses at me and states get away from her.  Counseled patient that if she does not allow [name] to examine her and change dressings she could lose her legs, again patient curses at myself and the nurse and tells [name] to leave her alone and God was the one who fixed her leg. -7/9 wound care: cleansing once  daily with NS, gently patting dry and covering with folded xeroform gauze [name] # [phone]).  This would be covered with an ABD pad and secured with a few turns of Kerlix roll gauze/paper tape.  The heels should be placed into Prevalon Boots [name] # [phone]) to prevent pressure injury.  Sepsis due to bilateral lower extremity cellulitis with dry gangrene, in the setting of severe PVD. (Present on admission) -7/8 appears sepsis physiology resolving.  Will need to monitor closely when patient allows   Staphylococcus bacteremia.  positive staph capitis -Blood cultures #2/2 positive for staphylococcus cutis -S/p right superficial femoral and popliteal artery stenting. -7/1 repeat set of cultures negative final -Echocardiogram with no evident vegetation. -Patient had 2 days of oral antibiotic therapy due to no IV access. Now that patient has a functioning IV access will resume vancomycin. - Per ID Continue vancomycin while inpatient and change to oral doxycycline at discharge with treatment through 7/14. Wound care per Dr. Sharol Given and Dr. Stanford Breed.  Severe PVD -Multifactorial long-term uncontrolled diabetic, essential HTN, poor overall health.  -Maximize treatment above issues   DM type II uncontrolled with complication: DM nephropathy, PVD -5/29 hemoglobin A1c= 11.1 Lab Results  Component Value Date   GLUCAP 209 (H) 05/03/2021   GLUCAP 129 (H) 05/03/2021   GLUCAP 123 (H) 05/02/2021   GLUCAP 117 (H) 05/02/2021   GLUCAP 96 05/02/2021     Chronic respiratory failure with hypoxia - Continue with supplemental 02 per [place] Resolved.     Paroxysmal atrial fibrillation. -Continue with metoprolol for rate control and apixaban for  anticoagulation     Essential HTN  -See A. Fib  Dyslipidemia.   On amlodipine and metoprolol for blood pressure control. On statin    AKI on CKD stage 3b(baseline ~Cr 1.5)  Lab Results  Component Value Date   CREATININE 1.41 (H) 05/01/2021   CREATININE 1.48 (H)  04/30/2021   CREATININE 1.50 (H) 04/29/2021   CREATININE 1.65 (H) 04/28/2021   CREATININE 1.69 (H) 04/27/2021  -Baseline  Non anion gap metabolic acidosis.  -Resolved   Depression.   -Patient with severe mood swings, declining medical treatment at times. -Per Psychiatry patient and son not concerned with decision making/actions, declines any further intervention -continue with lorazepam, duloxetine, trazodone, keppra. -7/9 increase Quetiapine.  50 mg daily  Paranoia/Anxiety - 7/8 Haldol 2 mg TID.   -7/9 increase Haldol 3 mg QID -7/9 Ativan 2 mg PRN  Mental health issues - Schizoaffective?  Schizophrenia with paranoia?  Severe depression? - 7/9 unsure of what patient's mental health diagnosis would be however discussed patient with primary point of contact listed which is her brother Carloyn Jaeger he concurs that his sister has some sort of mental health issue.  States that he had requested psychiatric intervention during this hospitalization which had occurred (patient and son both stated there was no problem and through psychiatrist out of room).  Brother states that son cannot even take care of himself and concurs with my assessment that he should not be making any medical decisions for patient. - 7/9 patient not competent to make medical decisions. - 7/9 reconsult psychiatry for  help with management of patient's mental health issues.  If patient mental health issues or not resolved patient at serious risk of losing her legs or her life. ADDENDUM; it has been determined that son Sindy Guadeloupe does have written HCPOA however since he has been actively interfering with patient care and preventing the RNs from appropriately taking care of patient after speaking with her brother, sister and daughter during family meeting he has been banned from the hospital for the time being. - On Monday 7/11 will meet with the hospital attorneys in order to determine if patient can be made XXX and son  Sindy Guadeloupe all information even over the phone be withheld from son. - As of 7/9 the family has agreed that daughter [name] will make goal healthcare decisions for patient.    Multiple pressure injuries -See below  Pressure Injury 12/03/20 Perineum Posterior Stage 1 -  Intact skin with non-blanchable redness of a localized area usually over a bony prominence. (Active)  12/03/20 2210  Location: Perineum  Location Orientation: Posterior  Staging: Stage 1 -  Intact skin with non-blanchable redness of a localized area usually over a bony prominence.  Wound Description (Comments):   Present on Admission: Yes     Pressure Injury 12/04/20 Coccyx Right;Medial Stage 2 -  Partial thickness loss of dermis presenting as a shallow open injury with a red, pink wound bed without slough. 1cm x 0.5cm area to the right of previously healed pressure ulcer (Active)  12/04/20 0800  Location: Coccyx  Location Orientation: Right;Medial  Staging: Stage 2 -  Partial thickness loss of dermis presenting as a shallow open injury with a red, pink wound bed without slough.  Wound Description (Comments): 1cm x 0.5cm area to the right of previously healed pressure ulcer  Present on Admission: Yes  -7/8 awaiting air bed  Hypokalemia - Potassium goal> 4 - 7/6 potassium IV 50 mEq  Anemia unspecified - 7/6 anemia panel pending Lab Results  Component Value Date   HGB 7.3 (L) 05/01/2021   HGB 8.7 (L) 04/30/2021   HGB 7.7 (L) 04/29/2021   HGB 7.6 (L) 04/27/2021   HGB 8.4 (L) 04/25/2021  -7/7 hemoglobin dropped significantly overnight may be secondary to her surgery. - Transfer for hemoglobin<7 - Occult blood pending  Obese (BMI 31.24 kg/m)     DVT prophylaxis:  Code Status:  Family Communication: 7/6 son [name] at bedside for discussion of plan of care all questions answered Status is: Inpatient    Dispo: The patient is from: Home              Anticipated d/c is to: SNF vs home               Anticipated d/c date is: Per vascular surgery              Patient currently unstable      Consultants:  Vascular surgery    Procedures/Significant Events:  7/1 Echocardiogram limited:Left Ventricle: Left ventricular ejection fraction, by estimation, is 60  to 65%. The left ventricle has normal function. The left ventricle has no  regional wall motion abnormalities. The left ventricular internal cavity  size was normal in size.   Right Ventricle: There is mildly elevated pulmonary artery systolic  pressure. The tricuspid regurgitant velocity is 2.90 m/s, and with an  assumed right atrial pressure of 3 mmHg, the estimated right ventricular  systolic pressure is 10.2 mmHg.   Pericardium: There is no evidence of pericardial  effusion.   Mitral Valve: The mitral valve is grossly normal. Mild mitral valve  regurgitation. There is no evidence of mitral valve vegetation.   Tricuspid Valve: The tricuspid valve is grossly normal. Tricuspid valve  regurgitation is mild. There is no evidence of tricuspid valve vegetation.   Aortic Valve: The aortic valve is normal in structure. Aortic valve  regurgitation is not visualized. There is no evidence of aortic valve  vegetation.   Pulmonic Valve: The pulmonic valve was grossly normal. Pulmonic valve  regurgitation is mild.  7/6 s/p1) US guided right common femoral access 2) left lower extremity angiogram with second order cannulation   I have personally reviewed and interpreted all radiology studies and my findings are as above.   VENTILATOR SETTINGS:    Cultures 6/27 SARS coronavirus negative 6/27 influenza A/B negative 6/27 blood RIGHT AC positive staph capitis 6/27 blood RIGHT arm positive staph capitis 7/1 blood RIGHT AC negative final 7/1 blood RIGHT hand negative final   Antimicrobials: Anti-infectives (From admission, onward)    Start     Ordered Stop   04/30/21 2200  vancomycin (VANCOREADY) IVPB 750 mg/150 mL         04/29/21 1453     04/29/21 1545  vancomycin (VANCOREADY) IVPB 1500 mg/300 mL        04/29/21 1453 04/29/21 2314   04/27/21 1700  vancomycin (VANCOREADY) IVPB 1000 mg/200 mL  Status:  Discontinued        04/26/21 1159 04/27/21 1303   04/27/21 1700  vancomycin (VANCOCIN) IVPB 1000 mg/200 mL premix  Status:  Discontinued        04/27/21 1303 04/27/21 1331   04/27/21 1430  doxycycline (VIBRA-TABS) tablet 100 mg  Status:  Discontinued        04/27/21 1331 04/29/21 1433   04/24/21 1600  vancomycin (VANCOREADY) IVPB 1250 mg/250 mL  Status:  Discontinued        04/24/21 1519 04/26/21 1159   04/22/21 1200  vancomycin (VANCOREADY) IVPB 1250 mg/250 mL  Status:  Discontinued        04/21/21 1232 04/24/21 1519   04/21/21 2200  ceFEPIme (MAXIPIME) 2 g in sodium chloride 0.9 % 100 mL IVPB  Status:  Discontinued        04/21/21 1249 04/24/21 1411   04/21/21 1000  vancomycin (VANCOREADY) IVPB 1500 mg/300 mL        04/21/21 0948 04/21/21 1443   04/21/21 0945  ceFEPIme (MAXIPIME) 2 g in sodium chloride 0.9 % 100 mL IVPB        04/21/21 0936 04/21/21 1114         Devices    LINES / TUBES:      Continuous Infusions:  lactated ringers 10 mL/hr at 04/30/21 1255   vancomycin 750 mg (05/03/21 0215)     Objective: Vitals:   05/02/21 1010 05/02/21 1112 05/02/21 2205 05/03/21 0700  BP: 117/85 (!) 121/49 111/69 119/84  Pulse: 93 96 85 91  Resp: 17  16 17   Temp: 98.9 F (37.2 C)  98.7 F (37.1 C) 98.4 F (36.9 C)  TempSrc: Oral  Oral Oral  SpO2: 96% 98% 97% 97%  Weight:      Height:        Intake/Output Summary (Last 24 hours) at 05/03/2021 1448 Last data filed at 05/03/2021 1240 Gross per 24 hour  Intake 240 ml  Output --  Net 240 ml    Filed Weights   04/30/21 0615 04/30/21 1236 05/01/21 0606  Weight:  75 kg 75 kg 74.7 kg    Examination:  General: A/O x4, No acute respiratory distress Eyes: negative scleral hemorrhage, negative anisocoria, negative icterus ENT: Negative  Runny nose, negative gingival bleeding, Neck:  Negative scars, masses, torticollis, lymphadenopathy, JVD -Patient yelling and cursing at myself and RN will not allow further examination. .     Data Reviewed: Care during the described time interval was provided by me .  I have reviewed this patient's available data, including medical history, events of note, physical examination, and all test results as part of my evaluation.  CBC: Recent Labs  Lab 04/27/21 0339 04/29/21 0224 04/30/21 0415 05/01/21 0404  WBC 9.5  --  10.1 9.5  NEUTROABS 6.4  --  7.1 7.0  HGB 7.6* 7.7* 8.7* 7.3*  HCT 25.7* 26.7* 29.1* 24.7*  MCV 95.5  --  94.5 95.4  PLT 297  --  261 017    Basic Metabolic Panel: Recent Labs  Lab 04/27/21 0339 04/28/21 0242 04/29/21 0224 04/30/21 0415 05/01/21 0404  NA 143 140 143 143 142  K 3.1* 4.3 3.3* 3.1* 4.0  CL 113* 113* 111 113* 113*  CO2 22 21* 25 23 23   GLUCOSE 129* 153* 153* 146* 110*  BUN 13 13 12 10 11   CREATININE 1.69* 1.65* 1.50* 1.48* 1.41*  CALCIUM 8.8* 8.6* 8.8* 8.7* 8.6*  MG  --   --  1.6* 2.1 1.8  PHOS  --   --   --   --  2.5    GFR: Estimated Creatinine Clearance: 41.2 mL/min (A) (by C-G formula based on SCr of 1.41 mg/dL (H)). Liver Function Tests: Recent Labs  Lab 05/01/21 0404  AST 8*  ALT 8  ALKPHOS 70  BILITOT 0.9  PROT 5.2*  ALBUMIN 1.5*    No results for input(s): LIPASE, AMYLASE in the last 168 hours. No results for input(s): AMMONIA in the last 168 hours. Coagulation Profile: No results for input(s): INR, PROTIME in the last 168 hours. Cardiac Enzymes: No results for input(s): CKTOTAL, CKMB, CKMBINDEX, TROPONINI in the last 168 hours. BNP (last 3 results) No results for input(s): PROBNP in the last 8760 hours. HbA1C: No results for input(s): HGBA1C in the last 72 hours. CBG: Recent Labs  Lab 05/02/21 1100 05/02/21 1815 05/02/21 2203 05/03/21 0606 05/03/21 1224  GLUCAP 96 117* 123* 129* 209*    Lipid Profile: No  results for input(s): CHOL, HDL, LDLCALC, TRIG, CHOLHDL, LDLDIRECT in the last 72 hours. Thyroid Function Tests: No results for input(s): TSH, T4TOTAL, FREET4, T3FREE, THYROIDAB in the last 72 hours. Anemia Panel: Recent Labs    05/01/21 0404  VITAMINB12 1,001*  FOLATE 14.6  FERRITIN 352*  TIBC 115*  IRON 30  RETICCTPCT 3.4*    Sepsis Labs: No results for input(s): PROCALCITON, LATICACIDVEN in the last 168 hours.  Recent Results (from the past 240 hour(s))  Culture, blood (Routine X 2) w Reflex to ID Panel     Status: None   Collection Time: 04/25/21  8:18 AM   Specimen: BLOOD  Result Value Ref Range Status   Specimen Description BLOOD RIGHT ANTECUBITAL  Final   Special Requests   Final    BOTTLES DRAWN AEROBIC ONLY Blood Culture results may not be optimal due to an inadequate volume of blood received in culture bottles   Culture   Final    NO GROWTH 5 DAYS Performed at [facility], [place] [address] [place], [place] [address]    Report Status  04/30/2021 FINAL  Final  Culture, blood (Routine X 2) w Reflex to ID Panel     Status: None   Collection Time: 04/25/21  8:18 AM   Specimen: BLOOD RIGHT HAND  Result Value Ref Range Status   Specimen Description BLOOD RIGHT HAND  Final   Special Requests   Final    BOTTLES DRAWN AEROBIC ONLY Blood Culture results may not be optimal due to an inadequate volume of blood received in culture bottles   Culture   Final    NO GROWTH 5 DAYS Performed at [facility], [place] [address], [place], [place] [address]    Report Status 04/30/2021 FINAL  Final          Radiology Studies: No results found.      Scheduled Meds:  amLODipine  10 mg Oral Daily   apixaban  5 mg Oral BID   aspirin EC  81 mg Oral Daily   DULoxetine  60 mg Oral Daily   haloperidol lactate  2 mg Intravenous Q6H   insulin aspart  0-5 Units Subcutaneous QHS   insulin aspart  0-9 Units Subcutaneous TID WC   insulin aspart  3 Units Subcutaneous TID WC    levETIRAcetam  750 mg Oral BID   metoprolol tartrate  50 mg Oral BID   oxybutynin  5 mg Oral QHS   pantoprazole  40 mg Oral Daily   QUEtiapine  25 mg Oral QHS   rosuvastatin  10 mg Oral Daily   traZODone  50 mg Oral QHS   zinc sulfate  220 mg Oral Daily   Continuous Infusions:  lactated ringers 10 mL/hr at 04/30/21 1255   vancomycin 750 mg (05/03/21 0215)     LOS: 12 days    Time spent:40 min    Anastaisa Wooding, Geraldo Docker, MD [facility]   If 7PM-7AM, please contact night-coverage 05/03/2021, 2:48 PM

## 2021-05-03 NOTE — Consult Note (Signed)
[facility] Nurse Consult Note: Reason for Consult:Wound care order recommendations  Wound type:Chronic, nonhealing. Pressure Injury POA: N/A  Discussed with Provider options for topical wound care. Also recommended pressure redistribution heel boots for pressure injury prevention.  [facility] nursing team will not follow, but will remain available to this patient, the nursing and medical teams.  Please re-consult if needed. Thanks, Maudie Flakes, MSN, RN, [place], [facility]  Pager# [phone]

## 2021-05-04 ENCOUNTER — Inpatient Hospital Stay: Payer: Self-pay

## 2021-05-04 DIAGNOSIS — I70263 Atherosclerosis of native arteries of extremities with gangrene, bilateral legs: Secondary | ICD-10-CM | POA: Diagnosis not present

## 2021-05-04 DIAGNOSIS — J9611 Chronic respiratory failure with hypoxia: Secondary | ICD-10-CM | POA: Diagnosis not present

## 2021-05-04 DIAGNOSIS — I4891 Unspecified atrial fibrillation: Secondary | ICD-10-CM | POA: Diagnosis not present

## 2021-05-04 DIAGNOSIS — A419 Sepsis, unspecified organism: Secondary | ICD-10-CM | POA: Diagnosis not present

## 2021-05-04 LAB — GLUCOSE, CAPILLARY
Glucose-Capillary: 105 mg/dL — ABNORMAL HIGH (ref 70–99)
Glucose-Capillary: 118 mg/dL — ABNORMAL HIGH (ref 70–99)
Glucose-Capillary: 110 mg/dL — ABNORMAL HIGH (ref 70–99)
Glucose-Capillary: 166 mg/dL — ABNORMAL HIGH (ref 70–99)

## 2021-05-04 LAB — COMPREHENSIVE METABOLIC PANEL
ALT: 9 U/L (ref 0–44)
AST: 8 U/L — ABNORMAL LOW (ref 15–41)
Albumin: 1.4 g/dL — ABNORMAL LOW (ref 3.5–5.0)
Alkaline Phosphatase: 62 U/L (ref 38–126)
Anion gap: 7 (ref 5–15)
BUN: 8 mg/dL (ref 6–20)
CO2: 23 mmol/L (ref 22–32)
Calcium: 8.3 mg/dL — ABNORMAL LOW (ref 8.9–10.3)
Chloride: 113 mmol/L — ABNORMAL HIGH (ref 98–111)
Creatinine, Ser: 1.11 mg/dL — ABNORMAL HIGH (ref 0.44–1.00)
GFR, Estimated: 58 mL/min — ABNORMAL LOW (ref >60)
Glucose, Bld: 115 mg/dL — ABNORMAL HIGH (ref 70–99)
Potassium: 3.6 mmol/L (ref 3.5–5.1)
Sodium: 143 mmol/L (ref 135–145)
Total Bilirubin: 0.6 mg/dL (ref 0.3–1.2)
Total Protein: 4.9 g/dL — ABNORMAL LOW (ref 6.5–8.1)

## 2021-05-04 LAB — VANCOMYCIN, PEAK: Vancomycin Pk: 44 ug/mL — ABNORMAL HIGH (ref 30–40)

## 2021-05-04 LAB — PHOSPHORUS: Phosphorus: 2.8 mg/dL (ref 2.5–4.6)

## 2021-05-04 LAB — MAGNESIUM: Magnesium: 1.5 mg/dL — ABNORMAL LOW (ref 1.7–2.4)

## 2021-05-04 LAB — VANCOMYCIN, TROUGH: Vancomycin Tr: 28 ug/mL (ref 15–20)

## 2021-05-04 MED ORDER — CHLORHEXIDINE GLUCONATE CLOTH 2 % EX PADS
6.0000 | MEDICATED_PAD | Freq: Every day | CUTANEOUS | Status: DC
  Administered 2021-05-04 – 2021-05-29 (×17): 6 via TOPICAL

## 2021-05-04 MED ORDER — METOPROLOL TARTRATE 5 MG/5ML IV SOLN
7.5000 mg | Freq: Once | INTRAVENOUS | Status: AC
  Administered 2021-05-04: 7.5 mg via INTRAVENOUS
  Filled 2021-05-04: qty 10

## 2021-05-04 MED ORDER — SODIUM CHLORIDE 0.9% FLUSH
10.0000 mL | INTRAVENOUS | Status: DC | PRN
  Administered 2021-05-06 – 2021-05-17 (×2): 10 mL
  Administered 2021-05-20 (×2): 20 mL

## 2021-05-04 MED ORDER — MAGNESIUM SULFATE 50 % IJ SOLN
3.0000 g | Freq: Once | INTRAVENOUS | Status: AC
  Administered 2021-05-04: 3 g via INTRAVENOUS
  Filled 2021-05-04: qty 6

## 2021-05-04 MED ORDER — MIDAZOLAM HCL 2 MG/2ML IJ SOLN
2.0000 mg | Freq: Once | INTRAMUSCULAR | Status: DC
  Filled 2021-05-04: qty 2

## 2021-05-04 MED ORDER — VANCOMYCIN HCL 750 MG/150ML IV SOLN
750.0000 mg | INTRAVENOUS | Status: AC
  Administered 2021-05-05 – 2021-05-07 (×2): 750 mg via INTRAVENOUS
  Filled 2021-05-04 (×2): qty 150

## 2021-05-04 MED ORDER — POTASSIUM CHLORIDE 10 MEQ/100ML IV SOLN
10.0000 meq | INTRAVENOUS | Status: AC
  Administered 2021-05-04 (×5): 10 meq via INTRAVENOUS
  Filled 2021-05-04 (×5): qty 100

## 2021-05-04 NOTE — Progress Notes (Signed)
PROGRESS NOTE    [name]  JME:[id number] DOB: 07-Apr-1966 DOA: 04/21/2021 PCP: Caprice Renshaw, MD     Brief Narrative:   [name] was admitted with the working diagnosis of sepsis due to bilateral lower extremity leg ulcerations with cellulitis and dry gangrene. Staphylococcus cutis bacteremia.    56 year old BF PMHx MDD, Seizures, tobacco abuse, COPD, severe peripheral vascular disease, paroxysmal atrial fibrillation, HTN, Dyslipidemia, DM Type 2 with complications, Diabetic Neuropathy,  Chronic Diastolic CHF, CKD stage III,  Presented with bilateral leg pain.  She was noted to have decreased p.o. intake at her nursing facility.  She does have chronic ulcerated lesions bilateral legs.  On her initial physical examination temperature 98.1, heart rate 150, respiratory rate 22, heart rate 90, blood pressure 116/92, oxygen saturation 93%, her lungs were clear to auscultation bilaterally, heart S1-S2, present, rhythmic, soft abdomen, positive pressure ulcers ventral side of distal lower extremities.  Stage IV pressure ulcers with necrotic skin.    Patient was placed on broad-spectrum antibiotic therapy with vancomycin and cefepime. Vascular surgery and orthopedic were consulted.   Patient was scheduled to have left common femoral artery access aortogram and right lower extremity angiography but patient declined to proceed on the Cath Lab table. This was discussed with the sister, and procedure was reattempted successfully under general anesthesia.    06/29 Sp right superficial femoral and popliteal artery stenting   Positive blood cultures for staph cutis  #2/2    Patient with poor IV acces, has declined PICC line several times.    Poor oral intake and continue to decline IV, change antibiotic therapy to oral doxycycline.   07/05 has new IV and Vancomycin resumed. Plan for left leg angioplasty on 07/06.      Subjective: 7/10 paged by RN Vanita Ingles that patient has had 27 beat run of  SVT asymptomatic.  Afebrile overnight.    Assessment & Plan: Covid vaccination; vaccinated 2/3   Principal Problem:   Sepsis ([place]) Active Problems:   Essential hypertension   INSOMNIA   Medical non-compliance   Poor mobility   Lower extremity weakness   HTN (hypertension), malignant   Hypokalemia   Diabetic neuropathy (HCC)   CKD (chronic kidney disease) stage 3, GFR 30-59 ml/min (HCC)   Severe recurrent major depression without psychotic features (HCC)   COPD (chronic obstructive pulmonary disease) (HCC)   At high risk for falls   PAF (paroxysmal atrial fibrillation) (HCC)   Atrial fibrillation with RVR (HCC)   Seizures (HCC)   Sacral ulcer, limited to breakdown of skin (HCC)   Reflux esophagitis   Multiple duodenal ulcers   Chronic respiratory failure with hypoxia (HCC)   Hypomagnesemia   PVD (peripheral vascular disease) (HCC)   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  Counseled patient that if she does not allow [name] to examine her and change dressings she could lose her legs, again patient curses at myself and the nurse and tells [name] to leave her alone and God was the one who fixed her leg. -7/9 wound care: cleansing once daily with NS, gently patting dry and covering with folded xeroform gauze Kellie Simmering # [phone]).  This would be covered with an ABD pad and secured with a few turns of Kerlix roll gauze/paper tape.  The heels should be placed into Prevalon Boots Kellie Simmering # [phone]) to prevent pressure injury.  Sepsis due to bilateral lower extremity cellulitis with dry gangrene, in the setting of severe PVD. (Present on  admission) -7/8 appears sepsis physiology resolving.  Will need to monitor closely when patient allows   Staphylococcus bacteremia.  positive staph capitis -Blood cultures #2/2 positive for staphylococcus cutis -S/p right superficial femoral and popliteal artery stenting. -7/1 repeat set of cultures negative final -Echocardiogram with no evident vegetation. -Patient had 2 days of oral antibiotic therapy due to no IV access. Now that patient has a functioning IV access will resume vancomycin. - Per ID Continue vancomycin while inpatient and change to oral doxycycline at discharge with treatment through 7/14. Wound care per Dr. Sharol Given and Dr. Stanford Breed.  Severe PVD -Multifactorial long-term uncontrolled diabetic, essential HTN, poor overall health.  -Maximize treatment above issues   DM type II uncontrolled with complication: DM nephropathy, PVD -5/29 hemoglobin A1c= 11.1 Lab Results  Component Value Date   GLUCAP 105 (H) 05/04/2021   GLUCAP 125 (H) 05/03/2021   GLUCAP 217 (H) 05/03/2021   GLUCAP 209 (H) 05/03/2021   GLUCAP 129 (H) 05/03/2021     Chronic respiratory failure with hypoxia - Continue with supplemental 02 per [place] Resolved.     Paroxysmal atrial fibrillation. -Apixaban 5 mg BID - 7/10 amlodipine 10 mg daily - Metoprolol 50 mg  BID -7/10 Metoprolol IV 7.5 mg x 1  SVT vs A. fib RVR - Replace electrolytes - 7/10 stat EKG    Essential HTN  -See A. Fib  Dyslipidemia.   On amlodipine and metoprolol for blood pressure control. On statin    AKI on CKD stage 3b(baseline ~Cr 1.5)  Lab Results  Component Value Date   CREATININE 1.11 (H) 05/04/2021   CREATININE 1.20 (H) 05/03/2021   CREATININE 1.41 (H) 05/01/2021   CREATININE 1.48 (H) 04/30/2021   CREATININE 1.50 (H) 04/29/2021  -Baseline  Non anion gap metabolic acidosis.  -Resolved   Depression.   -Patient with severe mood swings, declining medical treatment at times. -Per Psychiatry patient and son not  concerned with decision making/actions, declines any further intervention -continue with lorazepam, duloxetine, trazodone, keppra. -7/9 increase Quetiapine.  50 mg daily  Paranoia/Anxiety - 7/8 Haldol 2 mg TID.   -7/9 increase Haldol 3 mg QID -7/9 Ativan 2 mg PRN  Mental health issues - Schizoaffective?  Schizophrenia with paranoia?  Severe depression? - 7/9 unsure of what patient's mental health diagnosis would be however discussed patient with primary point of contact listed which is her brother Carloyn Jaeger he concurs that his sister has some sort of mental health issue.  States that he had requested psychiatric intervention during this hospitalization which had occurred (patient and son both stated there was no problem and through psychiatrist out of room).  Brother states that son cannot even take care of himself and concurs with my assessment that he should not be making any medical decisions for patient. - 7/9 patient not competent to make medical decisions. - 7/9 reconsult psychiatry for help with management of patient's mental health issues.  If patient mental health issues or not resolved patient at serious risk of losing her legs or her life. ADDENDUM; it has been determined that son Sindy Guadeloupe does have written HCPOA however since he has been actively interfering with patient care and  preventing the RNs from appropriately taking care of patient after speaking with her brother, sister and daughter during family meeting he has been banned from the hospital for the time being. - On Monday 7/11 will meet with the hospital attorneys in order to determine if patient can be made XXX and son Sindy Guadeloupe all information even over the phone be withheld from son. - As of 7/9 the family has agreed that daughter [name] will make goal healthcare decisions for patient.    Multiple pressure injuries -See below  Pressure Injury 12/03/20 Perineum Posterior Stage 1 -  Intact skin with  non-blanchable redness of a localized area usually over a bony prominence. (Active)  12/03/20 2210  Location: Perineum  Location Orientation: Posterior  Staging: Stage 1 -  Intact skin with non-blanchable redness of a localized area usually over a bony prominence.  Wound Description (Comments):   Present on Admission: Yes     Pressure Injury 12/04/20 Coccyx Right;Medial Stage 2 -  Partial thickness loss of dermis presenting as a shallow open injury with a red, pink wound bed without slough. 1cm x 0.5cm area to the right of previously healed pressure ulcer (Active)  12/04/20 0800  Location: Coccyx  Location Orientation: Right;Medial  Staging: Stage 2 -  Partial thickness loss of dermis presenting as a shallow open injury with a red, pink wound bed without slough.  Wound Description (Comments): 1cm x 0.5cm area to the right of previously healed pressure ulcer  Present on Admission: Yes  -7/8 awaiting air bed  Hypokalemia - Potassium goal> 4 - 7/6 potassium IV 50 mEq  Anemia unspecified - 7/6 anemia panel pending Lab Results  Component Value Date   HGB 7.8 (L) 05/03/2021   HGB 7.3 (L) 05/01/2021   HGB 8.7 (L) 04/30/2021   HGB 7.7 (L) 04/29/2021   HGB 7.6 (L) 04/27/2021  -7/7 hemoglobin dropped significantly overnight may be secondary to her surgery. - Transfer for hemoglobin<7 - Occult blood pending  Obese (BMI 31.24 kg/m)   Hypokalemia - Potassium goal> 4 - 7/10 potassium IV 50 mEq   Hypomagnesmia -Magnesium goal> 2 - 7/10 magnesium IV 3 g   DVT prophylaxis:  Code Status:  Family Communication: 7/6 son [name] at bedside for discussion of plan of care all questions answered Status is: Inpatient    Dispo: The patient is from: Home              Anticipated d/c is to: SNF vs home              Anticipated d/c date is: Per vascular surgery              Patient currently unstable      Consultants:  Vascular surgery    Procedures/Significant Events:  7/1  Echocardiogram limited:Left Ventricle: Left ventricular ejection fraction, by estimation, is 60  to 65%. The left ventricle has normal function. The left ventricle has no  regional wall motion abnormalities. The left ventricular internal cavity  size was normal in size.   Right Ventricle: There is mildly elevated pulmonary artery systolic  pressure. The tricuspid regurgitant velocity is 2.90 m/s, and with an  assumed right atrial pressure of 3 mmHg, the estimated right ventricular  systolic pressure is 28.7 mmHg.   Pericardium: There is no evidence of pericardial effusion.   Mitral Valve: The mitral valve is grossly normal. Mild mitral valve  regurgitation. There is no evidence of mitral valve vegetation.   Tricuspid Valve: The  tricuspid valve is grossly normal. Tricuspid valve  regurgitation is mild. There is no evidence of tricuspid valve vegetation.   Aortic Valve: The aortic valve is normal in structure. Aortic valve  regurgitation is not visualized. There is no evidence of aortic valve  vegetation.   Pulmonic Valve: The pulmonic valve was grossly normal. Pulmonic valve  regurgitation is mild.  7/6 s/p1) US guided right common femoral access 2) left lower extremity angiogram with second order cannulation   I have personally reviewed and interpreted all radiology studies and my findings are as above.   VENTILATOR SETTINGS:    Cultures 6/27 SARS coronavirus negative 6/27 influenza A/B negative 6/27 blood RIGHT AC positive staph capitis 6/27 blood RIGHT arm positive staph capitis 7/1 blood RIGHT AC negative final 7/1 blood RIGHT hand negative final   Antimicrobials: Anti-infectives (From admission, onward)    Start     Ordered Stop   04/30/21 2200  vancomycin (VANCOREADY) IVPB 750 mg/150 mL        04/29/21 1453     04/29/21 1545  vancomycin (VANCOREADY) IVPB 1500 mg/300 mL        04/29/21 1453 04/29/21 2314   04/27/21 1700  vancomycin (VANCOREADY) IVPB 1000 mg/200  mL  Status:  Discontinued        04/26/21 1159 04/27/21 1303   04/27/21 1700  vancomycin (VANCOCIN) IVPB 1000 mg/200 mL premix  Status:  Discontinued        04/27/21 1303 04/27/21 1331   04/27/21 1430  doxycycline (VIBRA-TABS) tablet 100 mg  Status:  Discontinued        04/27/21 1331 04/29/21 1433   04/24/21 1600  vancomycin (VANCOREADY) IVPB 1250 mg/250 mL  Status:  Discontinued        04/24/21 1519 04/26/21 1159   04/22/21 1200  vancomycin (VANCOREADY) IVPB 1250 mg/250 mL  Status:  Discontinued        04/21/21 1232 04/24/21 1519   04/21/21 2200  ceFEPIme (MAXIPIME) 2 g in sodium chloride 0.9 % 100 mL IVPB  Status:  Discontinued        04/21/21 1249 04/24/21 1411   04/21/21 1000  vancomycin (VANCOREADY) IVPB 1500 mg/300 mL        04/21/21 0948 04/21/21 1443   04/21/21 0945  ceFEPIme (MAXIPIME) 2 g in sodium chloride 0.9 % 100 mL IVPB        04/21/21 0936 04/21/21 1114         Devices    LINES / TUBES:      Continuous Infusions:  lactated ringers 10 mL/hr at 04/30/21 1255   vancomycin 750 mg (05/03/21 2205)     Objective: Vitals:   05/03/21 1500 05/03/21 2011 05/04/21 0449 05/04/21 0700  BP: 103/76 108/85 121/81 108/84  Pulse: 84 90 (!) 102 (!) 104  Resp: 16 15 17 18   Temp: 98.4 F (36.9 C) 98.3 F (36.8 C) 97.8 F (36.6 C) 99 F (37.2 C)  TempSrc: Oral Oral Oral Oral  SpO2: 98% 100% 93% 95%  Weight:      Height:        Intake/Output Summary (Last 24 hours) at 05/04/2021 0846 Last data filed at 05/04/2021 0700 Gross per 24 hour  Intake 240 ml  Output 1300 ml  Net -1060 ml    Filed Weights   04/30/21 0615 04/30/21 1236 05/01/21 0606  Weight: 75 kg 75 kg 74.7 kg   Physical Exam:  General: Patient calm, restful No acute respiratory distress Eyes: negative scleral hemorrhage, negative  anisocoria, negative icterus ENT: Negative Runny nose, negative gingival bleeding, Neck:  Negative scars, masses, torticollis, lymphadenopathy, JVD Lungs: Clear to  auscultation bilaterally without wheezes or crackles Cardiovascular: Regular rate and rhythm without murmur gallop or rub normal S1 and S2 Abdomen: negative abdominal pain, nondistended, positive soft, bowel sounds, no rebound, no ascites, no appreciable mass Extremities: No significant cyanosis, clubbing, or edema bilateral lower extremities Skin: Negative rashes, lesions, ulcers Psychiatric:  Negative depression, negative anxiety, negative fatigue, negative mania  Central nervous system:  Cranial nerves II through XII intact, tongue/uvula midline, all extremities muscle strength 5/5, sensation intact throughout, negative dysarthria, negative expressive aphasia, negative receptive aphasia..     Data Reviewed: Care during the described time interval was provided by me .  I have reviewed this patient's available data, including medical history, events of note, physical examination, and all test results as part of my evaluation.  CBC: Recent Labs  Lab 04/29/21 0224 04/30/21 0415 05/01/21 0404 05/03/21 1907  WBC  --  10.1 9.5 8.9  NEUTROABS  --  7.1 7.0 6.4  HGB 7.7* 8.7* 7.3* 7.8*  HCT 26.7* 29.1* 24.7* 26.0*  MCV  --  94.5 95.4 93.9  PLT  --  261 281 604    Basic Metabolic Panel: Recent Labs  Lab 04/29/21 0224 04/30/21 0415 05/01/21 0404 05/03/21 1907 05/04/21 0043  NA 143 143 142 143 143  K 3.3* 3.1* 4.0 3.6 3.6  CL 111 113* 113* 110 113*  CO2 25 23 23 25 23   GLUCOSE 153* 146* 110* 157* 115*  BUN 12 10 11 9 8   CREATININE 1.50* 1.48* 1.41* 1.20* 1.11*  CALCIUM 8.8* 8.7* 8.6* 8.5* 8.3*  MG 1.6* 2.1 1.8 1.6* 1.5*  PHOS  --   --  2.5 2.6 2.8    GFR: Estimated Creatinine Clearance: 52.4 mL/min (A) (by C-G formula based on SCr of 1.11 mg/dL (H)). Liver Function Tests: Recent Labs  Lab 05/01/21 0404 05/03/21 1907 05/04/21 0043  AST 8* 10* 8*  ALT 8 8 9   ALKPHOS 70 76 62  BILITOT 0.9 0.5 0.6  PROT 5.2* 5.5* 4.9*  ALBUMIN 1.5* 1.6* 1.4*    No results for  input(s): LIPASE, AMYLASE in the last 168 hours. No results for input(s): AMMONIA in the last 168 hours. Coagulation Profile: No results for input(s): INR, PROTIME in the last 168 hours. Cardiac Enzymes: No results for input(s): CKTOTAL, CKMB, CKMBINDEX, TROPONINI in the last 168 hours. BNP (last 3 results) No results for input(s): PROBNP in the last 8760 hours. HbA1C: No results for input(s): HGBA1C in the last 72 hours. CBG: Recent Labs  Lab 05/03/21 0606 05/03/21 1224 05/03/21 1535 05/03/21 2008 05/04/21 0716  GLUCAP 129* 209* 217* 125* 105*    Lipid Profile: No results for input(s): CHOL, HDL, LDLCALC, TRIG, CHOLHDL, LDLDIRECT in the last 72 hours. Thyroid Function Tests: No results for input(s): TSH, T4TOTAL, FREET4, T3FREE, THYROIDAB in the last 72 hours. Anemia Panel: No results for input(s): VITAMINB12, FOLATE, FERRITIN, TIBC, IRON, RETICCTPCT in the last 72 hours.  Sepsis Labs: No results for input(s): PROCALCITON, LATICACIDVEN in the last 168 hours.  Recent Results (from the past 240 hour(s))  Culture, blood (Routine X 2) w Reflex to ID Panel     Status: None   Collection Time: 04/25/21  8:18 AM   Specimen: BLOOD  Result Value Ref Range Status   Specimen Description BLOOD RIGHT ANTECUBITAL  Final   Special Requests   Final    BOTTLES  DRAWN AEROBIC ONLY Blood Culture results may not be optimal due to an inadequate volume of blood received in culture bottles   Culture   Final    NO GROWTH 5 DAYS Performed at [facility], [place] [address], [place], [place] [address]    Report Status 04/30/2021 FINAL  Final  Culture, blood (Routine X 2) w Reflex to ID Panel     Status: None   Collection Time: 04/25/21  8:18 AM   Specimen: BLOOD RIGHT HAND  Result Value Ref Range Status   Specimen Description BLOOD RIGHT HAND  Final   Special Requests   Final    BOTTLES DRAWN AEROBIC ONLY Blood Culture results may not be optimal due to an inadequate volume of blood received  in culture bottles   Culture   Final    NO GROWTH 5 DAYS Performed at [facility], [place] [address] [place], [place] [address]    Report Status 04/30/2021 FINAL  Final          Radiology Studies: No results found.      Scheduled Meds:  amLODipine  10 mg Oral Daily   apixaban  5 mg Oral BID   aspirin EC  81 mg Oral Daily   DULoxetine  60 mg Oral Daily   haloperidol lactate  3 mg Intravenous Q6H   insulin aspart  0-5 Units Subcutaneous QHS   insulin aspart  0-9 Units Subcutaneous TID WC   insulin aspart  3 Units Subcutaneous TID WC   levETIRAcetam  750 mg Oral BID   metoprolol tartrate  50 mg Oral BID   oxybutynin  5 mg Oral QHS   pantoprazole  40 mg Oral Daily   QUEtiapine  50 mg Oral QHS   rosuvastatin  10 mg Oral Daily   traZODone  50 mg Oral QHS   zinc sulfate  220 mg Oral Daily   Continuous Infusions:  lactated ringers 10 mL/hr at 04/30/21 1255   vancomycin 750 mg (05/03/21 2205)     LOS: 13 days    Time spent:40 min    Venessa Wickham, Geraldo Docker, MD [facility]   If 7PM-7AM, please contact night-coverage 05/04/2021, 8:46 AM

## 2021-05-04 NOTE — Progress Notes (Signed)
Pt with 25 beats run of SVT. Dr. Sherral Hammers made aware. New orders placed. VWilliams,RN.

## 2021-05-04 NOTE — Plan of Care (Signed)
Pt took her night time pills with applesauce. RN was able to give whole cup of applesauce to the pt. with encouragement however refused p.o intakes. RN received several notifications from [place] that pt runs from SR to ST, SVT and atrial tachy. TRH on call was notified and ordered EKG. Pt denies SOB or chest pain. Pt noted to be calm and cooperative during the shift. Dressing change done to BLE this AM.   Problem: Education: Goal: Knowledge of General Education information will improve Description: Including pain rating scale, medication(s)/side effects and non-pharmacologic comfort measures Outcome: Progressing   Problem: Health Behavior/Discharge Planning: Goal: Ability to manage health-related needs will improve Outcome: Progressing   Problem: Safety: Goal: Ability to remain free from injury will improve Outcome: Progressing   Problem: Skin Integrity: Goal: Risk for impaired skin integrity will decrease Outcome: Progressing

## 2021-05-04 NOTE — Progress Notes (Signed)
Peripherally Inserted Central Catheter Placement  The IV Nurse has discussed with the patient and/or persons authorized to consent for the patient, the purpose of this procedure and the potential benefits and risks involved with this procedure.  The benefits include less needle sticks, lab draws from the catheter, and the patient may be discharged home with the catheter. Risks include, but not limited to, infection, bleeding, blood clot (thrombus formation), and puncture of an artery; nerve damage and irregular heartbeat and possibility to perform a PICC exchange if needed/ordered by physician.  Alternatives to this procedure were also discussed.  Bard Power PICC patient education guide, fact sheet on infection prevention and patient information card has been provided to patient /or left at bedside.  Telephone consent obtained from dtr.  Pt not communicating when in room, but willingly allowed me to place PICC and move her arm without resistance.  PICC Placement Documentation  PICC Double Lumen 84/78/41 PICC Right Basilic 34 cm 0 cm (Active)  Indication for Insertion or Continuance of Line Poor Vasculature-patient has had multiple peripheral attempts or PIVs lasting less than 24 hours 05/04/21 1358  Exposed Catheter (cm) 0 cm 05/04/21 1358  Site Assessment Clean;Dry;Intact;Ecchymotic 05/04/21 1358  Lumen #1 Status Flushed;Saline locked;Blood return noted 05/04/21 1358  Lumen #2 Status Flushed;Saline locked;Blood return noted 05/04/21 1358  Dressing Type Transparent 05/04/21 1358  Dressing Status Clean;Dry;Intact 05/04/21 1358  Antimicrobial disc in place? Yes 05/04/21 [place] checked and tightened 05/04/21 1358  Line Adjustment (NICU/IV Team Only) No 05/04/21 1358  Dressing Intervention New dressing 05/04/21 1358  Dressing Change Due 05/11/21 05/04/21 1358       Rolena Infante 05/04/2021, 1:59 PM

## 2021-05-04 NOTE — Consult Note (Addendum)
   Psych consult placed for - 7/9 patient not competent to make medical decisions.  - 7/9 reconsult psychiatry for help with management of patient's mental health issues.  If patient mental health issues or not resolved patient at serious risk of losing her legs or her life.   Patient seen and attempted to evaluate, she does not wish to participate in psychiatric evaluation. She introduces self, and does not answer any additional questions. Writer attempted to discuss with patient her reasons for refusing care, in which she does not reply. Patient does not admit to having any underlying mental health issues. She does not choose to engage in conversation, despite acknowledging my entry into the room. Chart review shows she has been argumentative, defiant and refusal of care. Patient appears to be resistant to treatment at this time.     -Although patient does not have capacity to make medical decisions, she can continue to refuse care. Consider placing patient under IVC, if she is considered a danger to herself. Although we are unable to force patient to participate in treatment or care.  -May benefit from initiation of low dose Seroquel to help with agitation and mood lability.  -Please reconsult psychiatry once patient is willing to participate in evaluation.   Psychiatry to sign off at this time.   Patient seen face-to-face for psychiatric evaluation, chart reviewed and case discussed with the physician extender and developed treatment plan. Reviewed the information documented and agree with the treatment plan. Corena Pilgrim, MD

## 2021-05-05 DIAGNOSIS — A419 Sepsis, unspecified organism: Secondary | ICD-10-CM | POA: Diagnosis not present

## 2021-05-05 DIAGNOSIS — I70263 Atherosclerosis of native arteries of extremities with gangrene, bilateral legs: Secondary | ICD-10-CM | POA: Diagnosis not present

## 2021-05-05 DIAGNOSIS — I4891 Unspecified atrial fibrillation: Secondary | ICD-10-CM | POA: Diagnosis not present

## 2021-05-05 DIAGNOSIS — J9611 Chronic respiratory failure with hypoxia: Secondary | ICD-10-CM | POA: Diagnosis not present

## 2021-05-05 LAB — CBC WITH DIFFERENTIAL/PLATELET
Abs Immature Granulocytes: 0.03 10*3/uL (ref 0.00–0.07)
Basophils Absolute: 0.1 10*3/uL (ref 0.0–0.1)
Basophils Relative: 1 %
Eosinophils Absolute: 0.2 10*3/uL (ref 0.0–0.5)
Eosinophils Relative: 3 %
HCT: 25.6 % — ABNORMAL LOW (ref 36.0–46.0)
Hemoglobin: 7.4 g/dL — ABNORMAL LOW (ref 12.0–15.0)
Immature Granulocytes: 0 %
Lymphocytes Relative: 17 %
Lymphs Abs: 1.4 10*3/uL (ref 0.7–4.0)
MCH: 28.2 pg (ref 26.0–34.0)
MCHC: 28.9 g/dL — ABNORMAL LOW (ref 30.0–36.0)
MCV: 97.7 fL (ref 80.0–100.0)
Monocytes Absolute: 0.7 10*3/uL (ref 0.1–1.0)
Monocytes Relative: 8 %
Neutro Abs: 5.9 10*3/uL (ref 1.7–7.7)
Neutrophils Relative %: 71 %
Platelets: 243 10*3/uL (ref 150–400)
RBC: 2.62 MIL/uL — ABNORMAL LOW (ref 3.87–5.11)
RDW: 17.6 % — ABNORMAL HIGH (ref 11.5–15.5)
WBC: 8.3 10*3/uL (ref 4.0–10.5)
nRBC: 0.2 % (ref 0.0–0.2)

## 2021-05-05 LAB — COMPREHENSIVE METABOLIC PANEL
ALT: 8 U/L (ref 0–44)
AST: 9 U/L — ABNORMAL LOW (ref 15–41)
Albumin: 1.4 g/dL — ABNORMAL LOW (ref 3.5–5.0)
Alkaline Phosphatase: 65 U/L (ref 38–126)
Anion gap: 5 (ref 5–15)
BUN: 7 mg/dL (ref 6–20)
CO2: 24 mmol/L (ref 22–32)
Calcium: 8.3 mg/dL — ABNORMAL LOW (ref 8.9–10.3)
Chloride: 112 mmol/L — ABNORMAL HIGH (ref 98–111)
Creatinine, Ser: 1.03 mg/dL — ABNORMAL HIGH (ref 0.44–1.00)
GFR, Estimated: >60 mL/min (ref >60)
Glucose, Bld: 133 mg/dL — ABNORMAL HIGH (ref 70–99)
Potassium: 4.1 mmol/L (ref 3.5–5.1)
Sodium: 141 mmol/L (ref 135–145)
Total Bilirubin: 0.5 mg/dL (ref 0.3–1.2)
Total Protein: 5.3 g/dL — ABNORMAL LOW (ref 6.5–8.1)

## 2021-05-05 LAB — MAGNESIUM: Magnesium: 2 mg/dL (ref 1.7–2.4)

## 2021-05-05 LAB — GLUCOSE, CAPILLARY
Glucose-Capillary: 111 mg/dL — ABNORMAL HIGH (ref 70–99)
Glucose-Capillary: 163 mg/dL — ABNORMAL HIGH (ref 70–99)
Glucose-Capillary: 140 mg/dL — ABNORMAL HIGH (ref 70–99)
Glucose-Capillary: 181 mg/dL — ABNORMAL HIGH (ref 70–99)

## 2021-05-05 LAB — PHOSPHORUS: Phosphorus: 2.7 mg/dL (ref 2.5–4.6)

## 2021-05-05 LAB — OCCULT BLOOD X 1 CARD TO LAB, STOOL: Fecal Occult Bld: POSITIVE — AB

## 2021-05-05 MED ORDER — METOPROLOL TARTRATE 5 MG/5ML IV SOLN
5.0000 mg | Freq: Once | INTRAVENOUS | Status: AC
  Administered 2021-05-05: 5 mg via INTRAVENOUS
  Filled 2021-05-05: qty 5

## 2021-05-05 MED ORDER — HALOPERIDOL LACTATE 5 MG/ML IJ SOLN
4.0000 mg | Freq: Four times a day (QID) | INTRAMUSCULAR | Status: DC
  Administered 2021-05-05 – 2021-05-08 (×7): 4 mg via INTRAVENOUS
  Filled 2021-05-05 (×8): qty 1

## 2021-05-05 NOTE — TOC Progression Note (Signed)
Transition of Care [facility]) - Progression Note    Patient Details  Name: RITIKA HELLICKSON MRN: [id number] Date of Birth: 09/04/1966  Transition of Care [facility]) CM/SW [place], [place] Phone Number: 05/05/2021, 2:34 PM  Clinical Narrative:    CSW was notified by MD that psych was requesting IVC at this time. After conversation with Psych and TOC supervisor, it was determined pt does not meet criteria, as she is not actively attempting to leave. If this changes and pt has the capacity to leave, an IVC can be placed at that time. SW will be available for any further needs.        Expected Discharge Plan and Services                                                 Social Determinants of Health (SDOH) Interventions    Readmission Risk Interventions Readmission Risk Prevention Plan 10/31/2020 08/27/2020  Transportation Screening Complete Complete  Medication Review Press photographer) Complete Complete  PCP or Specialist appointment within 3-5 days of discharge - Complete  HRI or [place] - Complete  SW Recovery Care/Counseling Consult Complete Complete  Palliative Care Screening Not Applicable Not Applicable  Skilled Nursing Facility Complete Complete  Some recent data might be hidden

## 2021-05-05 NOTE — Plan of Care (Signed)
  Problem: Education: Goal: Knowledge of General Education information will improve Description: Including pain rating scale, medication(s)/side effects and non-pharmacologic comfort measures Outcome: Progressing   Problem: Nutrition: Goal: Adequate nutrition will be maintained Outcome: Progressing   

## 2021-05-05 NOTE — Progress Notes (Signed)
PROGRESS NOTE    [name]  WGN:[id number] DOB: Oct 03, 1966 DOA: 04/21/2021 PCP: Caprice Renshaw, MD     Brief Narrative:   [name] was admitted with the working diagnosis of sepsis due to bilateral lower extremity leg ulcerations with cellulitis and dry gangrene. Staphylococcus cutis bacteremia.    [name] PMHx MDD, Seizures, tobacco abuse, COPD, severe peripheral vascular disease, paroxysmal atrial fibrillation, HTN, Dyslipidemia, DM Type 2 with complications, Diabetic Neuropathy,  Chronic Diastolic CHF, CKD stage III,  Presented with bilateral leg pain.  She was noted to have decreased p.o. intake at her nursing facility.  She does have chronic ulcerated lesions bilateral legs.  On her initial physical examination temperature 98.1, heart rate 150, respiratory rate 22, heart rate 90, blood pressure 116/92, oxygen saturation 93%, her lungs were clear to auscultation bilaterally, heart S1-S2, present, rhythmic, soft abdomen, positive pressure ulcers ventral side of distal lower extremities.  Stage IV pressure ulcers with necrotic skin.    Patient was placed on broad-spectrum antibiotic therapy with vancomycin and cefepime. Vascular surgery and orthopedic were consulted.   Patient was scheduled to have left common femoral artery access aortogram and right lower extremity angiography but patient declined to proceed on the Cath Lab table. This was discussed with the sister, and procedure was reattempted successfully under general anesthesia.    06/29 Sp right superficial femoral and popliteal artery stenting   Positive blood cultures for staph cutis  #2/2    Patient with poor IV acces, has declined PICC line several times.    Poor oral intake and continue to decline IV, change antibiotic therapy to oral doxycycline.   07/05 has new IV and Vancomycin resumed. Plan for left leg angioplasty on 07/06.      Subjective: 7/11 afebrile overnight alert, states that she hates me.   States having some pain.    Assessment & Plan: Covid vaccination; vaccinated 2/3   Principal Problem:   Sepsis ([name]) Active Problems:   Essential hypertension   INSOMNIA   Medical non-compliance   Poor mobility   Lower extremity weakness   HTN (hypertension), malignant   Hypokalemia   Diabetic neuropathy (HCC)   CKD (chronic kidney disease) stage 3, GFR 30-59 ml/min (HCC)   Severe recurrent major depression without psychotic features (HCC)   COPD (chronic obstructive pulmonary disease) (HCC)   At high risk for falls   PAF (paroxysmal atrial fibrillation) (HCC)   Atrial fibrillation with RVR (HCC)   Seizures (HCC)   Sacral ulcer, limited to breakdown of skin (HCC)   Reflux esophagitis   Multiple duodenal ulcers   Chronic respiratory failure with hypoxia (HCC)   Hypomagnesemia   PVD (peripheral vascular disease) (HCC)   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  Counseled patient that if she does not allow [name] to examine her and change dressings she could lose her legs, again patient curses at myself and the nurse and tells [name] to leave her alone and God was the one who fixed her leg. -7/9 wound care: cleansing once daily with NS, gently patting dry and covering with folded xeroform gauze Kellie Simmering # [id number]).  This would be covered  with an ABD pad and secured with a few turns of Kerlix roll gauze/paper tape.  The heels should be placed into Prevalon Boots Kellie Simmering # [id number]) to prevent pressure injury.  Sepsis due to bilateral lower extremity cellulitis with dry gangrene, in the setting of severe PVD. (Present on admission) -7/8 appears  sepsis physiology resolving.  Will need to monitor closely when patient allows   Staphylococcus bacteremia.  positive staph capitis -Blood cultures #2/2 positive for staphylococcus cutis -S/p right superficial femoral and popliteal artery stenting. -7/1 repeat set of cultures negative final -Echocardiogram with no evident vegetation. -Patient had 2 days of oral antibiotic therapy due to no IV access. Now that patient has a functioning IV access will resume vancomycin. - Per ID Continue vancomycin while inpatient and change to oral doxycycline at discharge with treatment through 7/14. -Wound care per Dr. Sharol Given and Dr. Stanford Breed.  Severe PVD -Multifactorial long-term uncontrolled diabetic, essential HTN, poor overall health.  -Maximize treatment above issues   DM type II uncontrolled with complication: DM nephropathy, PVD -5/29 hemoglobin A1c= 11.1 Lab Results  Component Value Date   GLUCAP 140 (H) 05/05/2021   GLUCAP 163 (H) 05/05/2021   GLUCAP 111 (H) 05/05/2021   GLUCAP 166 (H) 05/04/2021   GLUCAP 110 (H) 05/04/2021     Chronic respiratory failure with hypoxia - Continue with supplemental 02 per [place] Resolved.     Paroxysmal atrial fibrillation.-Apixaban 5 mg BID - 7/10 amlodipine 10 mg daily - Metoprolol 50 mg  BID -7/10 Metoprolol IV 7.5 mg x 1 -Transfuse for hemoglobin<7  SVT vs A. fib RVR - Replace electrolytes - 7/10 stat EKG    Essential HTN  -See A. Fib  Dyslipidemia.   On amlodipine and metoprolol for blood pressure control. On statin    AKI on CKD stage 3b(baseline ~Cr 1.5)  Lab Results  Component Value Date   CREATININE 1.03 (H) 05/05/2021   CREATININE 1.11 (H) 05/04/2021   CREATININE 1.20 (H) 05/03/2021   CREATININE 1.41 (H) 05/01/2021   CREATININE 1.48 (H) 04/30/2021  -Baseline  Non anion gap metabolic acidosis.  -Resolved  Anemia unspecified - 7/11 anemia panel pending - 7/11 occult blood pending Lab Results  Component Value Date   HGB 7.4  (L) 05/05/2021   HGB 7.8 (L) 05/03/2021   HGB 7.3 (L) 05/01/2021   HGB 8.7 (L) 04/30/2021   HGB 7.7 (L) 04/29/2021     Depression.   -Patient with severe mood swings, declining medical treatment at times. -Per Psychiatry patient and son not concerned with decision making/actions, declines any further intervention -continue with lorazepam, duloxetine, trazodone, keppra. - 7/9 increase Quetiapine.  50 mg QHS  Paranoia/Anxiety - 7/8 Haldol 2 mg TID.   - 7/11 increase Haldol 4 mg QID -7/9 Ativan 2 mg PRN  Mental health issues - Schizoaffective?  Schizophrenia with paranoia?  Severe depression? - 7/9 unsure of what patient's mental health diagnosis would be however discussed patient with primary point of contact listed which is her brother Carloyn Jaeger he concurs that his sister has some sort of mental health issue.  States that he had requested psychiatric intervention during this hospitalization which had occurred (patient and son both stated there was no problem and through psychiatrist out of room).  Brother states that son cannot even take care of himself and concurs with my assessment that he should not be making any medical decisions for patient. - 7/9 patient not competent to make medical decisions. - 7/9 reconsult psychiatry for help  with management of patient's mental health issues.  If patient mental health issues or not resolved patient at serious risk of losing her legs or her life. ADDENDUM; it has been determined that son Sindy Guadeloupe does have what appear to be written forged HCPOA however since he has been actively interfering with patient care and preventing the RNs from appropriately taking care of patient after speaking with her brother, sister and daughter during family meeting he has been banned from the hospital for the time being. - On Monday 7/11 will meet with the hospital attorneys in order to determine if patient can be made XXX and son Sindy Guadeloupe all information  even over the phone be withheld from son. - As of 7/9 the family has agreed (son [name] Herbin DOES NOT have properly executed HCPOA) therefore daughter [name] will make goal healthcare decisions for patient.    Multiple pressure injuries -See below  Pressure Injury 12/03/20 Perineum Posterior Stage 1 -  Intact skin with non-blanchable redness of a localized area usually over a bony prominence. (Active)  12/03/20 2210  Location: Perineum  Location Orientation: Posterior  Staging: Stage 1 -  Intact skin with non-blanchable redness of a localized area usually over a bony prominence.  Wound Description (Comments):   Present on Admission: Yes     Pressure Injury 12/04/20 Coccyx Right;Medial Stage 2 -  Partial thickness loss of dermis presenting as a shallow open injury with a red, pink wound bed without slough. 1cm x 0.5cm area to the right of previously healed pressure ulcer (Active)  12/04/20 0800  Location: Coccyx  Location Orientation: Right;Medial  Staging: Stage 2 -  Partial thickness loss of dermis presenting as a shallow open injury with a red, pink wound bed without slough.  Wound Description (Comments): 1cm x 0.5cm area to the right of previously healed pressure ulcer  Present on Admission: Yes  -7/8 awaiting air bed  Hypokalemia - Potassium goal> 4 - 7/6 potassium IV 50 mEq  Anemia unspecified - 7/6 anemia panel pending Lab Results  Component Value Date   HGB 7.4 (L) 05/05/2021   HGB 7.8 (L) 05/03/2021   HGB 7.3 (L) 05/01/2021   HGB 8.7 (L) 04/30/2021   HGB 7.7 (L) 04/29/2021  -7/7 hemoglobin dropped significantly overnight may be secondary to her surgery. - Transfer for hemoglobin<7 - Occult blood pending  Obese (BMI 31.24 kg/m)   Hypokalemia - Potassium goal> 4 - 7/10 potassium IV 50 mEq   Hypomagnesmia -Magnesium goal> 2 - 7/10 magnesium IV 3 g   ADDENDUM; after lengthy conversation with risk-management the following was agreed upon. 1.  Son  [name] is banned from [address]    Son [name] will have no say in medical decisions concerning patient.  Daughter [name] will be the primary person for making medical decisions for patient. 3.  Son [name] at this time son will be informed once a day of how patient condition is faring or if there is a significant change.   DVT prophylaxis:  Code Status:  Family Communication: 7/6 son [name] at bedside for discussion of plan of care all questions answered Status is: Inpatient    Dispo: The patient is from: Home              Anticipated d/c is to: SNF vs home              Anticipated d/c date is: Per vascular surgery  Patient currently unstable      Consultants:  Vascular surgery    Procedures/Significant Events:  7/1 Echocardiogram limited:Left Ventricle: Left ventricular ejection fraction, by estimation, is 60  to 65%. The left ventricle has normal function. The left ventricle has no  regional wall motion abnormalities. The left ventricular internal cavity  size was normal in size.   Right Ventricle: There is mildly elevated pulmonary artery systolic  pressure. The tricuspid regurgitant velocity is 2.90 m/s, and with an  assumed right atrial pressure of 3 mmHg, the estimated right ventricular  systolic pressure is 22.0 mmHg.   Pericardium: There is no evidence of pericardial effusion.   Mitral Valve: The mitral valve is grossly normal. Mild mitral valve  regurgitation. There is no evidence of mitral valve vegetation.   Tricuspid Valve: The tricuspid valve is grossly normal. Tricuspid valve  regurgitation is mild. There is no evidence of tricuspid valve vegetation.   Aortic Valve: The aortic valve is normal in structure. Aortic valve  regurgitation is not visualized. There is no evidence of aortic valve  vegetation.   Pulmonic Valve: The pulmonic valve was grossly normal. Pulmonic valve  regurgitation is mild.  7/6 s/p1) US guided right common femoral  access 2) left lower extremity angiogram with second order cannulation   I have personally reviewed and interpreted all radiology studies and my findings are as above.   VENTILATOR SETTINGS:    Cultures 6/27 SARS coronavirus negative 6/27 influenza A/B negative 6/27 blood RIGHT AC positive staph capitis 6/27 blood RIGHT arm positive staph capitis 7/1 blood RIGHT AC negative final 7/1 blood RIGHT hand negative final   Antimicrobials: Anti-infectives (From admission, onward)    Start     Ordered Stop   04/30/21 2200  vancomycin (VANCOREADY) IVPB 750 mg/150 mL        04/29/21 1453     04/29/21 1545  vancomycin (VANCOREADY) IVPB 1500 mg/300 mL        04/29/21 1453 04/29/21 2314   04/27/21 1700  vancomycin (VANCOREADY) IVPB 1000 mg/200 mL  Status:  Discontinued        04/26/21 1159 04/27/21 1303   04/27/21 1700  vancomycin (VANCOCIN) IVPB 1000 mg/200 mL premix  Status:  Discontinued        04/27/21 1303 04/27/21 1331   04/27/21 1430  doxycycline (VIBRA-TABS) tablet 100 mg  Status:  Discontinued        04/27/21 1331 04/29/21 1433   04/24/21 1600  vancomycin (VANCOREADY) IVPB 1250 mg/250 mL  Status:  Discontinued        04/24/21 1519 04/26/21 1159   04/22/21 1200  vancomycin (VANCOREADY) IVPB 1250 mg/250 mL  Status:  Discontinued        04/21/21 1232 04/24/21 1519   04/21/21 2200  ceFEPIme (MAXIPIME) 2 g in sodium chloride 0.9 % 100 mL IVPB  Status:  Discontinued        04/21/21 1249 04/24/21 1411   04/21/21 1000  vancomycin (VANCOREADY) IVPB 1500 mg/300 mL        04/21/21 0948 04/21/21 1443   04/21/21 0945  ceFEPIme (MAXIPIME) 2 g in sodium chloride 0.9 % 100 mL IVPB        04/21/21 0936 04/21/21 1114         Devices    LINES / TUBES:      Continuous Infusions:  lactated ringers 10 mL/hr at 04/30/21 1255   vancomycin       Objective: Vitals:   05/04/21 1535 05/04/21 2305 05/05/21  0754 05/05/21 1411  BP: 127/86 (!) 124/97 (!) 132/106 (!) 141/102  Pulse: 94  83 86 98  Resp: 18 18 17 17   Temp: 98.2 F (36.8 C) 98.9 F (37.2 C) 98.2 F (36.8 C) 98 F (36.7 C)  TempSrc: Oral Oral Oral Oral  SpO2: 99% 100% 96% 94%  Weight:      Height:        Intake/Output Summary (Last 24 hours) at 05/05/2021 1642 Last data filed at 05/05/2021 1300 Gross per 24 hour  Intake 480 ml  Output 250 ml  Net 230 ml    Filed Weights   04/30/21 0615 04/30/21 1236 05/01/21 0606  Weight: 75 kg 75 kg 74.7 kg   Physical Exam:  General: Patient calm, restful No acute respiratory distress Eyes: negative scleral hemorrhage, negative anisocoria, negative icterus ENT: Negative Runny nose, negative gingival bleeding, Neck:  Negative scars, masses, torticollis, lymphadenopathy, JVD Lungs: Clear to auscultation bilaterally without wheezes or crackles Cardiovascular: Regular rate and rhythm without murmur gallop or rub normal S1 and S2 Abdomen: negative abdominal pain, nondistended, positive soft, bowel sounds, no rebound, no ascites, no appreciable mass Extremities: No significant cyanosis, clubbing, or edema bilateral lower extremities Skin: Negative rashes, lesions, ulcers Psychiatric:  Negative depression, negative anxiety, negative fatigue, negative mania  Central nervous system:  Cranial nerves II through XII intact, tongue/uvula midline, all extremities muscle strength 5/5, sensation intact throughout, negative dysarthria, negative expressive aphasia, negative receptive aphasia..     Data Reviewed: Care during the described time interval was provided by me .  I have reviewed this patient's available data, including medical history, events of note, physical examination, and all test results as part of my evaluation.  CBC: Recent Labs  Lab 04/29/21 0224 04/30/21 0415 05/01/21 0404 05/03/21 1907 05/05/21 0357  WBC  --  10.1 9.5 8.9 8.3  NEUTROABS  --  7.1 7.0 6.4 5.9  HGB 7.7* 8.7* 7.3* 7.8* 7.4*  HCT 26.7* 29.1* 24.7* 26.0* 25.6*  MCV  --  94.5 95.4 93.9  97.7  PLT  --  261 281 259 222    Basic Metabolic Panel: Recent Labs  Lab 04/30/21 0415 05/01/21 0404 05/03/21 1907 05/04/21 0043 05/05/21 0357  NA 143 142 143 143 141  K 3.1* 4.0 3.6 3.6 4.1  CL 113* 113* 110 113* 112*  CO2 23 23 25 23 24   GLUCOSE 146* 110* 157* 115* 133*  BUN 10 11 9 8 7   CREATININE 1.48* 1.41* 1.20* 1.11* 1.03*  CALCIUM 8.7* 8.6* 8.5* 8.3* 8.3*  MG 2.1 1.8 1.6* 1.5* 2.0  PHOS  --  2.5 2.6 2.8 2.7    GFR: Estimated Creatinine Clearance: 56.4 mL/min (A) (by C-G formula based on SCr of 1.03 mg/dL (H)). Liver Function Tests: Recent Labs  Lab 05/01/21 0404 05/03/21 1907 05/04/21 0043 05/05/21 0357  AST 8* 10* 8* 9*  ALT 8 8 9 8   ALKPHOS 70 76 62 65  BILITOT 0.9 0.5 0.6 0.5  PROT 5.2* 5.5* 4.9* 5.3*  ALBUMIN 1.5* 1.6* 1.4* 1.4*    No results for input(s): LIPASE, AMYLASE in the last 168 hours. No results for input(s): AMMONIA in the last 168 hours. Coagulation Profile: No results for input(s): INR, PROTIME in the last 168 hours. Cardiac Enzymes: No results for input(s): CKTOTAL, CKMB, CKMBINDEX, TROPONINI in the last 168 hours. BNP (last 3 results) No results for input(s): PROBNP in the last 8760 hours. HbA1C: No results for input(s): HGBA1C in the last 72 hours. CBG:  Recent Labs  Lab 05/04/21 1532 05/04/21 2001 05/05/21 0656 05/05/21 1108 05/05/21 1614  GLUCAP 110* 166* 111* 163* 140*    Lipid Profile: No results for input(s): CHOL, HDL, LDLCALC, TRIG, CHOLHDL, LDLDIRECT in the last 72 hours. Thyroid Function Tests: No results for input(s): TSH, T4TOTAL, FREET4, T3FREE, THYROIDAB in the last 72 hours. Anemia Panel: No results for input(s): VITAMINB12, FOLATE, FERRITIN, TIBC, IRON, RETICCTPCT in the last 72 hours.  Sepsis Labs: No results for input(s): PROCALCITON, LATICACIDVEN in the last 168 hours.  No results found for this or any previous visit (from the past 240 hour(s)).         Radiology Studies: [name] EKG SITE  RITE  Result Date: 05/04/2021 If Site Rite image not attached, placement could not be confirmed due to current cardiac rhythm.       Scheduled Meds:  amLODipine  10 mg Oral Daily   apixaban  5 mg Oral BID   aspirin EC  81 mg Oral Daily   Chlorhexidine Gluconate Cloth  6 each Topical Daily   DULoxetine  60 mg Oral Daily   haloperidol lactate  3 mg Intravenous Q6H   insulin aspart  0-5 Units Subcutaneous QHS   insulin aspart  0-9 Units Subcutaneous TID WC   insulin aspart  3 Units Subcutaneous TID WC   levETIRAcetam  750 mg Oral BID   metoprolol tartrate  50 mg Oral BID   midazolam  2 mg Intravenous Once   oxybutynin  5 mg Oral QHS   pantoprazole  40 mg Oral Daily   QUEtiapine  50 mg Oral QHS   rosuvastatin  10 mg Oral Daily   traZODone  50 mg Oral QHS   zinc sulfate  220 mg Oral Daily   Continuous Infusions:  lactated ringers 10 mL/hr at 04/30/21 1255   vancomycin       LOS: 14 days    Time spent:40 min    [name], Geraldo Docker, MD [facility]   If 7PM-7AM, please contact night-coverage 05/05/2021, 4:42 PM

## 2021-05-05 NOTE — Plan of Care (Signed)
  Problem: Pain Managment: Goal: General experience of comfort will improve Outcome: Progressing   Problem: Safety: Goal: Ability to remain free from injury will improve Outcome: Progressing   Problem: Skin Integrity: Goal: Risk for impaired skin integrity will decrease Outcome: Progressing   

## 2021-05-05 NOTE — Progress Notes (Signed)
Pharmacy Antibiotic Note  [name] is a 55 y.o. female admitted on 04/21/2021 with bacteremia.  Pharmacy has been consulted for vancomycin dosing.  Vanc peak 44, vanc trough 28 >> AUC 872, above goal.  Plan to treat through 7/14 with vanc until discharge then PO doxy.  Plan: Vancomycin 750 mg IV Q48H. Goal AUC 400-550.  Expected AUC 440.  SCr 1.11.   Height: 5\' 1"  (154.9 cm) Weight: 74.7 kg (164 lb 10.9 oz) IBW/kg (Calculated) : 47.8  Temp (24hrs), Avg:98.5 F (36.9 C), Min:97.8 F (36.6 C), Max:99 F (37.2 C)  Recent Labs  Lab 04/29/21 0224 04/30/21 0415 05/01/21 0404 05/03/21 1907 05/04/21 0043 05/04/21 2253  WBC  --  10.1 9.5 8.9  --   --   CREATININE 1.50* 1.48* 1.41* 1.20* 1.11*  --   VANCOTROUGH  --   --   --   --   --  28*  VANCOPEAK  --   --   --   --  44*  --     Estimated Creatinine Clearance: 52.4 mL/min (A) (by C-G formula based on SCr of 1.11 mg/dL (H)).    No Known Allergies   Thank you for allowing pharmacy to be a part of this patient's care.  Wynona Neat, PharmD, BCPS  05/05/2021 12:00 AM

## 2021-05-05 NOTE — Progress Notes (Signed)
Received a call from tele that pt has had 5 beats of wide qrs svt, Pt seen in room,alert/oriented to self in no apparent distress. No complaints voiced. Attending MD made aware of above, and received order for metroprolol 5mg  iv x1.

## 2021-05-06 DIAGNOSIS — K649 Unspecified hemorrhoids: Secondary | ICD-10-CM

## 2021-05-06 DIAGNOSIS — J9611 Chronic respiratory failure with hypoxia: Secondary | ICD-10-CM | POA: Diagnosis not present

## 2021-05-06 DIAGNOSIS — I4891 Unspecified atrial fibrillation: Secondary | ICD-10-CM | POA: Diagnosis not present

## 2021-05-06 DIAGNOSIS — I70263 Atherosclerosis of native arteries of extremities with gangrene, bilateral legs: Secondary | ICD-10-CM | POA: Diagnosis not present

## 2021-05-06 DIAGNOSIS — A419 Sepsis, unspecified organism: Secondary | ICD-10-CM | POA: Diagnosis not present

## 2021-05-06 LAB — COMPREHENSIVE METABOLIC PANEL
ALT: 9 U/L (ref 0–44)
AST: 12 U/L — ABNORMAL LOW (ref 15–41)
Albumin: 1.4 g/dL — ABNORMAL LOW (ref 3.5–5.0)
Alkaline Phosphatase: 70 U/L (ref 38–126)
Anion gap: 6 (ref 5–15)
BUN: 9 mg/dL (ref 6–20)
CO2: 22 mmol/L (ref 22–32)
Calcium: 8.3 mg/dL — ABNORMAL LOW (ref 8.9–10.3)
Chloride: 113 mmol/L — ABNORMAL HIGH (ref 98–111)
Creatinine, Ser: 1.08 mg/dL — ABNORMAL HIGH (ref 0.44–1.00)
GFR, Estimated: >60 mL/min (ref >60)
Glucose, Bld: 175 mg/dL — ABNORMAL HIGH (ref 70–99)
Potassium: 3.6 mmol/L (ref 3.5–5.1)
Sodium: 141 mmol/L (ref 135–145)
Total Bilirubin: 0.7 mg/dL (ref 0.3–1.2)
Total Protein: 5 g/dL — ABNORMAL LOW (ref 6.5–8.1)

## 2021-05-06 LAB — CBC WITH DIFFERENTIAL/PLATELET
Abs Immature Granulocytes: 0.04 10*3/uL (ref 0.00–0.07)
Basophils Absolute: 0.1 10*3/uL (ref 0.0–0.1)
Basophils Relative: 1 %
Eosinophils Absolute: 0.1 10*3/uL (ref 0.0–0.5)
Eosinophils Relative: 1 %
HCT: 23.4 % — ABNORMAL LOW (ref 36.0–46.0)
Hemoglobin: 6.7 g/dL — CL (ref 12.0–15.0)
Immature Granulocytes: 0 %
Lymphocytes Relative: 11 %
Lymphs Abs: 1.2 10*3/uL (ref 0.7–4.0)
MCH: 27.9 pg (ref 26.0–34.0)
MCHC: 28.6 g/dL — ABNORMAL LOW (ref 30.0–36.0)
MCV: 97.5 fL (ref 80.0–100.0)
Monocytes Absolute: 0.6 10*3/uL (ref 0.1–1.0)
Monocytes Relative: 6 %
Neutro Abs: 8.8 10*3/uL — ABNORMAL HIGH (ref 1.7–7.7)
Neutrophils Relative %: 81 %
Platelets: 259 10*3/uL (ref 150–400)
RBC: 2.4 MIL/uL — ABNORMAL LOW (ref 3.87–5.11)
RDW: 17.5 % — ABNORMAL HIGH (ref 11.5–15.5)
WBC: 10.8 10*3/uL — ABNORMAL HIGH (ref 4.0–10.5)
nRBC: 0 % (ref 0.0–0.2)

## 2021-05-06 LAB — RETICULOCYTES
Immature Retic Fract: 33.8 % — ABNORMAL HIGH (ref 2.3–15.9)
RBC.: 2.41 MIL/uL — ABNORMAL LOW (ref 3.87–5.11)
Retic Count, Absolute: 81 10*3/uL (ref 19.0–186.0)
Retic Ct Pct: 3.4 % — ABNORMAL HIGH (ref 0.4–3.1)

## 2021-05-06 LAB — IRON AND TIBC
Iron: 38 ug/dL (ref 28–170)
Saturation Ratios: 33 % — ABNORMAL HIGH (ref 10.4–31.8)
TIBC: 116 ug/dL — ABNORMAL LOW (ref 250–450)
UIBC: 78 ug/dL

## 2021-05-06 LAB — MAGNESIUM: Magnesium: 1.8 mg/dL (ref 1.7–2.4)

## 2021-05-06 LAB — HEMOGLOBIN AND HEMATOCRIT, BLOOD
HCT: 29.7 % — ABNORMAL LOW (ref 36.0–46.0)
Hemoglobin: 9.1 g/dL — ABNORMAL LOW (ref 12.0–15.0)

## 2021-05-06 LAB — FOLATE: Folate: 13.4 ng/mL (ref >5.9)

## 2021-05-06 LAB — GLUCOSE, CAPILLARY
Glucose-Capillary: 144 mg/dL — ABNORMAL HIGH (ref 70–99)
Glucose-Capillary: 142 mg/dL — ABNORMAL HIGH (ref 70–99)
Glucose-Capillary: 175 mg/dL — ABNORMAL HIGH (ref 70–99)
Glucose-Capillary: 136 mg/dL — ABNORMAL HIGH (ref 70–99)

## 2021-05-06 LAB — VITAMIN B12: Vitamin B-12: 696 pg/mL (ref 180–914)

## 2021-05-06 LAB — PHOSPHORUS: Phosphorus: 3.1 mg/dL (ref 2.5–4.6)

## 2021-05-06 LAB — PREPARE RBC (CROSSMATCH): Order Confirmation: POSITIVE

## 2021-05-06 LAB — FERRITIN: Ferritin: 253 ng/mL (ref 11–307)

## 2021-05-06 MED ORDER — MAGNESIUM SULFATE 2 GM/50ML IV SOLN
2.0000 g | Freq: Once | INTRAVENOUS | Status: DC
  Filled 2021-05-06: qty 50

## 2021-05-06 MED ORDER — HYDROCORTISONE (PERIANAL) 2.5 % EX CREA
TOPICAL_CREAM | Freq: Two times a day (BID) | CUTANEOUS | Status: DC
  Administered 2021-05-15: 1 via RECTAL
  Filled 2021-05-06: qty 28.35

## 2021-05-06 MED ORDER — SODIUM CHLORIDE 0.9% IV SOLUTION: Freq: Once | INTRAVENOUS | Status: AC

## 2021-05-06 MED ORDER — POTASSIUM CHLORIDE 10 MEQ/100ML IV SOLN: 10.0000 meq | INTRAVENOUS | Status: DC

## 2021-05-06 NOTE — TOC Progression Note (Signed)
Transition of Care [facility]) - Progression Note    Patient Details  Name: [name] MRN: [id number] Date of Birth: 1966-04-01  Transition of Care [facility]) CM/SW Contact  Milinda Antis, LCSWA Phone Number: 05/06/2021, 2:09 PM  Clinical Narrative:     13:56-  CSW received a call from a female who identified himself as [place], [phone].  He reports that he is the patient's son.  CSW does not see his name listed on the patient's contact list.  [name] wanted to know why he was banned from the hospital when he is the patient's POA.  He also wanted to know about the patient's medical care and who is signing paperwork for the patient.  CSW informed [name] that CSW was unaware of this situation and could not give any information until CSW receives consent and verification.       Expected Discharge Plan and Services                                                 Social Determinants of Health (SDOH) Interventions    Readmission Risk Interventions Readmission Risk Prevention Plan 10/31/2020 08/27/2020  Transportation Screening Complete Complete  Medication Review Press photographer) Complete Complete  PCP or Specialist appointment within 3-5 days of discharge - Complete  HRI or [place] - Complete  SW Recovery Care/Counseling Consult Complete Complete  Palliative Care Screening Not Applicable Not Applicable  Skilled Nursing Facility Complete Complete  Some recent data might be hidden

## 2021-05-06 NOTE — Progress Notes (Addendum)
Date and time results received: 05/06/21 0525  Test: HGB Critical Value: 6.7  Name of Provider Notified: Clarene Essex, NP  Orders Received? Or Actions Taken?: T&S and 1 unit

## 2021-05-06 NOTE — Progress Notes (Addendum)
PROGRESS NOTE    [name]  CVE:[id number] DOB: 11-Jul-1966 DOA: 04/21/2021 PCP: Caprice Renshaw, MD     Brief Narrative:   [name] was admitted with the working diagnosis of sepsis due to bilateral lower extremity leg ulcerations with cellulitis and dry gangrene. Staphylococcus cutis bacteremia.    [name] PMHx MDD, Seizures, tobacco abuse, COPD, severe peripheral vascular disease, paroxysmal atrial fibrillation, HTN, Dyslipidemia, DM Type 2 with complications, Diabetic Neuropathy,  Chronic Diastolic CHF, CKD stage III,  Presented with bilateral leg pain.  She was noted to have decreased p.o. intake at her nursing facility.  She does have chronic ulcerated lesions bilateral legs.  On her initial physical examination temperature 98.1, heart rate 150, respiratory rate 22, heart rate 90, blood pressure 116/92, oxygen saturation 93%, her lungs were clear to auscultation bilaterally, heart S1-S2, present, rhythmic, soft abdomen, positive pressure ulcers ventral side of distal lower extremities.  Stage IV pressure ulcers with necrotic skin.    Patient was placed on broad-spectrum antibiotic therapy with vancomycin and cefepime. Vascular surgery and orthopedic were consulted.   Patient was scheduled to have left common femoral artery access aortogram and right lower extremity angiography but patient declined to proceed on the Cath Lab table. This was discussed with the sister, and procedure was reattempted successfully under general anesthesia.    06/29 Sp right superficial femoral and popliteal artery stenting   Positive blood cultures for staph cutis  #2/2    Patient with poor IV acces, has declined PICC line several times.    Poor oral intake and continue to decline IV, change antibiotic therapy to oral doxycycline.   07/05 has new IV and Vancomycin resumed. Plan for left leg angioplasty on 07/06.      Subjective: 7/12 afebrile overnight, A/O x4, cooperative, answered all  questions.  Hemoglobin dropped.  Overnight    Assessment & Plan: Covid vaccination; vaccinated 2/3   Principal Problem:   Sepsis ([place]) Active Problems:   Essential hypertension   INSOMNIA   Medical non-compliance   Poor mobility   Lower extremity weakness   HTN (hypertension), malignant   Hypokalemia   Diabetic neuropathy (HCC)   CKD (chronic kidney disease) stage 3, GFR 30-59 ml/min (HCC)   Severe recurrent major depression without psychotic features (HCC)   COPD (chronic obstructive pulmonary disease) (HCC)   At high risk for falls   PAF (paroxysmal atrial fibrillation) (HCC)   Atrial fibrillation with RVR (HCC)   Seizures (HCC)   Sacral ulcer, limited to breakdown of skin (HCC)   Reflux esophagitis   Multiple duodenal ulcers   Chronic respiratory failure with hypoxia (HCC)   Hypomagnesemia   PVD (peripheral vascular disease) (HCC)   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  Counseled patient that if she does not allow [name] to examine her and change dressings she could lose her legs, again patient curses at myself and the nurse and tells [name] to leave her alone and God was the one who fixed her leg. -7/9 wound care: cleansing once daily with NS, gently patting dry and covering with folded xeroform gauze Kellie Simmering # [phone]).  This would be covered with an ABD pad and secured with a few turns of Kerlix roll gauze/paper tape.  The heels should be placed into Prevalon Boots Kellie Simmering # [phone]) to prevent pressure injury.  Sepsis due to bilateral lower extremity cellulitis with dry gangrene, in the setting of severe  PVD. (Present on admission) -7/8 appears sepsis physiology resolving.  Will need to monitor closely when patient allows   Staphylococcus bacteremia.  positive staph capitis -Blood cultures #2/2 positive for staphylococcus cutis -S/p right superficial femoral and popliteal artery stenting. -7/1 repeat set of cultures negative final -Echocardiogram with no evident vegetation. -Patient had 2 days of oral antibiotic therapy due to no IV access. Now that patient has a functioning IV access will resume vancomycin. - Per ID Continue vancomycin while inpatient and change to oral doxycycline at discharge with treatment through 7/14. -Wound care per Dr. Sharol Given and Dr. Stanford Breed.  Severe PVD -Multifactorial long-term uncontrolled diabetic, essential HTN, poor overall health.  -Maximize treatment above issues   DM type II uncontrolled with complication: DM nephropathy, PVD -5/29 hemoglobin A1c= 11.1 Lab Results  Component Value Date   GLUCAP 136 (H) 05/06/2021   GLUCAP 175 (H) 05/06/2021   GLUCAP 142 (H) 05/06/2021   GLUCAP 144 (H) 05/06/2021   GLUCAP 181 (H) 05/05/2021     Chronic respiratory failure with hypoxia - Continue with supplemental 02 per [place] Resolved.     Paroxysmal atrial fibrillation. -7/12 apixaban 5 mg BID (hold) drop in hemoglobin - 7/10 amlodipine 10 mg daily - Metoprolol 50 mg  BID -7/10 Metoprolol IV 7.5 mg x 1 -Transfuse for hemoglobin<7  SVT vs A. fib RVR - Replace electrolytes - 7/10 stat EKG -7/12 resolved with replacement of electrolytes   Essential HTN  -See A. Fib  Dyslipidemia.       AKI on CKD stage 3b(baseline ~Cr 1.5)  Lab Results  Component Value Date   CREATININE 1.08 (H) 05/06/2021   CREATININE 1.03 (H) 05/05/2021   CREATININE 1.11 (H) 05/04/2021   CREATININE 1.20 (H) 05/03/2021   CREATININE 1.41 (H) 05/01/2021  -Baseline  Non anion gap metabolic acidosis.  -Resolved  Anemia unspecified - 7/11 anemia panel pending - 7/11 occult blood  pending Lab Results  Component Value Date   HGB 9.1 (L) 05/06/2021   HGB 6.7 (LL) 05/06/2021   HGB 7.4 (L) 05/05/2021   HGB 7.8 (L) 05/03/2021   HGB 7.3 (L) 05/01/2021  -7/12 transfuse 1 unit PRBC -7/12 most likely secondary to profusely bleeding hemorrhoids   Depression.   -Patient with severe mood swings, declining medical treatment at times. -Per Psychiatry patient and son not concerned with decision making/actions, declines any further intervention -continue with lorazepam, duloxetine, trazodone, keppra. - 7/9 increase Quetiapine.  50 mg QHS  Paranoia/Anxiety - 7/8 Haldol 2 mg TID.   - 7/11 increase Haldol 4 mg QID -7/9 Ativan 2 mg PRN -7/12 EKG.  Monitor for QT interval prolongation: NO QT prolongation  Mental health issues - Schizoaffective?  Schizophrenia with paranoia?  Severe depression? - 7/9 unsure of what patient's mental health diagnosis would be however discussed patient with primary point of contact listed which is her brother Carloyn Jaeger he concurs that his sister has some sort of mental health issue.  States that he had requested psychiatric intervention during this hospitalization which had occurred (patient and son both stated there was no problem and through psychiatrist out of room).  Brother states that son cannot even take care of himself and concurs  with my assessment that he should not be making any medical decisions for patient. - 7/9 patient not competent to make medical decisions. - 7/9 reconsult psychiatry for help with management of patient's mental health issues.  If patient mental health issues or not resolved patient at serious risk of losing her legs or her life. ADDENDUM; it has been determined that son Sindy Guadeloupe does have what appear to be written forged HCPOA however since he has been actively interfering with patient care and preventing the RNs from appropriately taking care of patient after speaking with her brother, sister and daughter during  family meeting he has been banned from the hospital for the time being. - On Monday 7/11 will meet with the hospital attorneys in order to determine if patient can be made XXX and son Sindy Guadeloupe all information even over the phone be withheld from son. - As of 7/9 the family has agreed (son [name] Herbin DOES NOT have properly executed HCPOA) therefore daughter [name] will make goal healthcare decisions for patient.    Multiple pressure injuries -See below  Pressure Injury 12/03/20 Perineum Posterior Stage 1 -  Intact skin with non-blanchable redness of a localized area usually over a bony prominence. (Active)  12/03/20 2210  Location: Perineum  Location Orientation: Posterior  Staging: Stage 1 -  Intact skin with non-blanchable redness of a localized area usually over a bony prominence.  Wound Description (Comments):   Present on Admission: Yes     Pressure Injury 12/04/20 Coccyx Right;Medial Stage 2 -  Partial thickness loss of dermis presenting as a shallow open injury with a red, pink wound bed without slough. 1cm x 0.5cm area to the right of previously healed pressure ulcer (Active)  12/04/20 0800  Location: Coccyx  Location Orientation: Right;Medial  Staging: Stage 2 -  Partial thickness loss of dermis presenting as a shallow open injury with a red, pink wound bed without slough.  Wound Description (Comments): 1cm x 0.5cm area to the right of previously healed pressure ulcer  Present on Admission: Yes  -7/8 awaiting air bed  Anemia unspecified - 7/6 anemia panel pending Lab Results  Component Value Date   HGB 9.1 (L) 05/06/2021   HGB 6.7 (LL) 05/06/2021   HGB 7.4 (L) 05/05/2021   HGB 7.8 (L) 05/03/2021   HGB 7.3 (L) 05/01/2021  -7/7 hemoglobin dropped significantly overnight may be secondary to her surgery. - Transfer for hemoglobin<7 - 7/11 occult blood positive  Bleeding hemorrhoids - Anusol cream 2.5% BID  Obese (BMI 31.24 kg/m)   Hypokalemia -  Potassium goal> 4 - 7/12 potassium IV 50 mEq   Hypomagnesmia -Magnesium goal> 2 - 7/12 magnesium IV 2 g   ADDENDUM; after lengthy conversation with risk-management the following was agreed upon. 1.  Son [name] is banned from [address]    Son [name] will have no say in medical decisions concerning patient.  Daughter [name] will be the primary person for making medical decisions for patient. 3.  Son [name] at this time son will be informed once a day of how patient condition is faring or if there is a significant change.   DVT prophylaxis:  Code Status:  Family Communication: 7/12  spoke with [name] daughter counseled on plan of care answered all questions  Status is: Inpatient    Dispo: The patient is from: Home              Anticipated d/c is to: SNF vs home  Anticipated d/c date is: Per vascular surgery              Patient currently unstable      Consultants:  Vascular surgery    Procedures/Significant Events:  7/1 Echocardiogram limited:Left Ventricle: Left ventricular ejection fraction, by estimation, is 60  to 65%. The left ventricle has normal function. The left ventricle has no  regional wall motion abnormalities. The left ventricular internal cavity  size was normal in size.   Right Ventricle: There is mildly elevated pulmonary artery systolic  pressure. The tricuspid regurgitant velocity is 2.90 m/s, and with an  assumed right atrial pressure of 3 mmHg, the estimated right ventricular  systolic pressure is 72.5 mmHg.   Pericardium: There is no evidence of pericardial effusion.   Mitral Valve: The mitral valve is grossly normal. Mild mitral valve  regurgitation. There is no evidence of mitral valve vegetation.   Tricuspid Valve: The tricuspid valve is grossly normal. Tricuspid valve  regurgitation is mild. There is no evidence of tricuspid valve vegetation.   Aortic Valve: The aortic valve is normal in structure. Aortic valve   regurgitation is not visualized. There is no evidence of aortic valve  vegetation.   Pulmonic Valve: The pulmonic valve was grossly normal. Pulmonic valve  regurgitation is mild.  7/6 s/p1) US guided right common femoral access 2) left lower extremity angiogram with second order cannulation   I have personally reviewed and interpreted all radiology studies and my findings are as above.   VENTILATOR SETTINGS:    Cultures 6/27 SARS coronavirus negative 6/27 influenza A/B negative 6/27 blood RIGHT AC positive staph capitis 6/27 blood RIGHT arm positive staph capitis 7/1 blood RIGHT AC negative final 7/1 blood RIGHT hand negative final 7/12 urine pending   Antimicrobials: Anti-infectives (From admission, onward)    Start     Ordered Stop   04/30/21 2200  vancomycin (VANCOREADY) IVPB 750 mg/150 mL        04/29/21 1453     04/29/21 1545  vancomycin (VANCOREADY) IVPB 1500 mg/300 mL        04/29/21 1453 04/29/21 2314   04/27/21 1700  vancomycin (VANCOREADY) IVPB 1000 mg/200 mL  Status:  Discontinued        04/26/21 1159 04/27/21 1303   04/27/21 1700  vancomycin (VANCOCIN) IVPB 1000 mg/200 mL premix  Status:  Discontinued        04/27/21 1303 04/27/21 1331   04/27/21 1430  doxycycline (VIBRA-TABS) tablet 100 mg  Status:  Discontinued        04/27/21 1331 04/29/21 1433   04/24/21 1600  vancomycin (VANCOREADY) IVPB 1250 mg/250 mL  Status:  Discontinued        04/24/21 1519 04/26/21 1159   04/22/21 1200  vancomycin (VANCOREADY) IVPB 1250 mg/250 mL  Status:  Discontinued        04/21/21 1232 04/24/21 1519   04/21/21 2200  ceFEPIme (MAXIPIME) 2 g in sodium chloride 0.9 % 100 mL IVPB  Status:  Discontinued        04/21/21 1249 04/24/21 1411   04/21/21 1000  vancomycin (VANCOREADY) IVPB 1500 mg/300 mL        04/21/21 0948 04/21/21 1443   04/21/21 0945  ceFEPIme (MAXIPIME) 2 g in sodium chloride 0.9 % 100 mL IVPB        04/21/21 0936 04/21/21 1114         Devices    LINES /  TUBES:      Continuous Infusions:  lactated  ringers 10 mL/hr at 04/30/21 1255   vancomycin Stopped (05/06/21 0009)     Objective: Vitals:   05/06/21 1020 05/06/21 1230 05/06/21 1500 05/06/21 1944  BP: 110/85 (!) 149/106 (!) 146/97 (!) 137/93  Pulse: 82 85 96 100  Resp: 13 13  18   Temp: 98.4 F (36.9 C) 98.4 F (36.9 C) 98.8 F (37.1 C) 98.7 F (37.1 C)  TempSrc: Oral Oral Oral Oral  SpO2:  96% 98% 98%  Weight:      Height:        Intake/Output Summary (Last 24 hours) at 05/06/2021 2113 Last data filed at 05/06/2021 1230 Gross per 24 hour  Intake 465 ml  Output --  Net 465 ml   Filed Weights   04/30/21 0615 04/30/21 1236 05/01/21 0606  Weight: 75 kg 75 kg 74.7 kg    Physical Exam:  General: A/O x4, answers all questions, calm No acute respiratory distress Eyes: negative scleral hemorrhage, negative anisocoria, negative icterus ENT: Negative Runny nose, negative gingival bleeding, Neck:  Negative scars, masses, torticollis, lymphadenopathy, JVD Lungs: Clear to auscultation bilaterally without wheezes or crackles Cardiovascular: Regular rate and rhythm without murmur gallop or rub normal S1 and S2 Abdomen: negative abdominal pain, nondistended, positive soft, bowel sounds, no rebound, no ascites, no appreciable mass Extremities: bilateral lower extremities wrapped, covered and clean.  DP/PT pulses 1+ bilateral Skin: Negative rashes, lesions, ulcers Psychiatric:  Negative depression, negative anxiety, negative fatigue, negative mania  Central nervous system:  Cranial nerves II through XII intact, tongue/uvula midline, all extremities muscle strength 5/5, sensation intact throughout, negative dysarthria, negative expressive aphasia, negative receptive aphasia.      Data Reviewed: Care during the described time interval was provided by me .  I have reviewed this patient's available data, including medical history, events of note, physical examination, and all test  results as part of my evaluation.  CBC: Recent Labs  Lab 04/30/21 0415 05/01/21 0404 05/03/21 1907 05/05/21 0357 05/06/21 0416 05/06/21 1624  WBC 10.1 9.5 8.9 8.3 10.8*  --   NEUTROABS 7.1 7.0 6.4 5.9 8.8*  --   HGB 8.7* 7.3* 7.8* 7.4* 6.7* 9.1*  HCT 29.1* 24.7* 26.0* 25.6* 23.4* 29.7*  MCV 94.5 95.4 93.9 97.7 97.5  --   PLT 261 281 259 243 259  --    Basic Metabolic Panel: Recent Labs  Lab 05/01/21 0404 05/03/21 1907 05/04/21 0043 05/05/21 0357 05/06/21 0416  NA 142 143 143 141 141  K 4.0 3.6 3.6 4.1 3.6  CL 113* 110 113* 112* 113*  CO2 23 25 23 24 22   GLUCOSE 110* 157* 115* 133* 175*  BUN 11 9 8 7 9   CREATININE 1.41* 1.20* 1.11* 1.03* 1.08*  CALCIUM 8.6* 8.5* 8.3* 8.3* 8.3*  MG 1.8 1.6* 1.5* 2.0 1.8  PHOS 2.5 2.6 2.8 2.7 3.1   GFR: Estimated Creatinine Clearance: 53.8 mL/min (A) (by C-G formula based on SCr of 1.08 mg/dL (H)). Liver Function Tests: Recent Labs  Lab 05/01/21 0404 05/03/21 1907 05/04/21 0043 05/05/21 0357 05/06/21 0416  AST 8* 10* 8* 9* 12*  ALT 8 8 9 8 9   ALKPHOS 70 76 62 65 70  BILITOT 0.9 0.5 0.6 0.5 0.7  PROT 5.2* 5.5* 4.9* 5.3* 5.0*  ALBUMIN 1.5* 1.6* 1.4* 1.4* 1.4*   No results for input(s): LIPASE, AMYLASE in the last 168 hours. No results for input(s): AMMONIA in the last 168 hours. Coagulation Profile: No results for input(s): INR, PROTIME in the last 168 hours.  Cardiac Enzymes: No results for input(s): CKTOTAL, CKMB, CKMBINDEX, TROPONINI in the last 168 hours. BNP (last 3 results) No results for input(s): PROBNP in the last 8760 hours. HbA1C: No results for input(s): HGBA1C in the last 72 hours. CBG: Recent Labs  Lab 05/05/21 2035 05/06/21 0629 05/06/21 1117 05/06/21 1622 05/06/21 2047  GLUCAP 181* 144* 142* 175* 136*   Lipid Profile: No results for input(s): CHOL, HDL, LDLCALC, TRIG, CHOLHDL, LDLDIRECT in the last 72 hours. Thyroid Function Tests: No results for input(s): TSH, T4TOTAL, FREET4, T3FREE, THYROIDAB  in the last 72 hours. Anemia Panel: Recent Labs    05/06/21 0416  VITAMINB12 696  FOLATE 13.4  FERRITIN 253  TIBC 116*  IRON 38  RETICCTPCT 3.4*   Sepsis Labs: No results for input(s): PROCALCITON, LATICACIDVEN in the last 168 hours.  No results found for this or any previous visit (from the past 240 hour(s)).         Radiology Studies: No results found.      Scheduled Meds:  amLODipine  10 mg Oral Daily   aspirin EC  81 mg Oral Daily   Chlorhexidine Gluconate Cloth  6 each Topical Daily   DULoxetine  60 mg Oral Daily   haloperidol lactate  4 mg Intravenous Q6H   hydrocortisone   Rectal BID   insulin aspart  0-5 Units Subcutaneous QHS   insulin aspart  0-9 Units Subcutaneous TID WC   insulin aspart  3 Units Subcutaneous TID WC   levETIRAcetam  750 mg Oral BID   metoprolol tartrate  50 mg Oral BID   midazolam  2 mg Intravenous Once   oxybutynin  5 mg Oral QHS   pantoprazole  40 mg Oral Daily   QUEtiapine  50 mg Oral QHS   rosuvastatin  10 mg Oral Daily   traZODone  50 mg Oral QHS   zinc sulfate  220 mg Oral Daily   Continuous Infusions:  lactated ringers 10 mL/hr at 04/30/21 1255   vancomycin Stopped (05/06/21 0009)     LOS: 15 days    Time spent:40 min    Romin Divita, Geraldo Docker, MD [facility]   If 7PM-7AM, please contact night-coverage 05/06/2021, 9:13 PM

## 2021-05-07 DIAGNOSIS — Z9181 History of falling: Secondary | ICD-10-CM | POA: Diagnosis not present

## 2021-05-07 DIAGNOSIS — A419 Sepsis, unspecified organism: Secondary | ICD-10-CM | POA: Diagnosis not present

## 2021-05-07 DIAGNOSIS — I4891 Unspecified atrial fibrillation: Secondary | ICD-10-CM | POA: Diagnosis not present

## 2021-05-07 DIAGNOSIS — I70263 Atherosclerosis of native arteries of extremities with gangrene, bilateral legs: Secondary | ICD-10-CM | POA: Diagnosis not present

## 2021-05-07 LAB — BPAM RBC
Blood Product Expiration Date: 202208052359
ISSUE DATE / TIME: 202207120939
Unit Type and Rh: 5100

## 2021-05-07 LAB — TYPE AND SCREEN
ABO/RH(D): O POS
Antibody Screen: NEGATIVE
Unit division: 0

## 2021-05-07 LAB — CBC WITH DIFFERENTIAL/PLATELET
Abs Immature Granulocytes: 0.07 10*3/uL (ref 0.00–0.07)
Basophils Absolute: 0.1 10*3/uL (ref 0.0–0.1)
Basophils Relative: 1 %
Eosinophils Absolute: 0.2 10*3/uL (ref 0.0–0.5)
Eosinophils Relative: 2 %
HCT: 28.5 % — ABNORMAL LOW (ref 36.0–46.0)
Hemoglobin: 8.6 g/dL — ABNORMAL LOW (ref 12.0–15.0)
Immature Granulocytes: 1 %
Lymphocytes Relative: 10 %
Lymphs Abs: 1.4 10*3/uL (ref 0.7–4.0)
MCH: 28.6 pg (ref 26.0–34.0)
MCHC: 30.2 g/dL (ref 30.0–36.0)
MCV: 94.7 fL (ref 80.0–100.0)
Monocytes Absolute: 0.8 10*3/uL (ref 0.1–1.0)
Monocytes Relative: 5 %
Neutro Abs: 11.6 10*3/uL — ABNORMAL HIGH (ref 1.7–7.7)
Neutrophils Relative %: 81 %
Platelets: 276 10*3/uL (ref 150–400)
RBC: 3.01 MIL/uL — ABNORMAL LOW (ref 3.87–5.11)
RDW: 18.6 % — ABNORMAL HIGH (ref 11.5–15.5)
WBC: 14.1 10*3/uL — ABNORMAL HIGH (ref 4.0–10.5)
nRBC: 0 % (ref 0.0–0.2)

## 2021-05-07 LAB — GLUCOSE, CAPILLARY
Glucose-Capillary: 167 mg/dL — ABNORMAL HIGH (ref 70–99)
Glucose-Capillary: 155 mg/dL — ABNORMAL HIGH (ref 70–99)
Glucose-Capillary: 96 mg/dL (ref 70–99)
Glucose-Capillary: 130 mg/dL — ABNORMAL HIGH (ref 70–99)
Glucose-Capillary: 166 mg/dL — ABNORMAL HIGH (ref 70–99)

## 2021-05-07 LAB — COMPREHENSIVE METABOLIC PANEL
ALT: 9 U/L (ref 0–44)
AST: 9 U/L — ABNORMAL LOW (ref 15–41)
Albumin: 1.6 g/dL — ABNORMAL LOW (ref 3.5–5.0)
Alkaline Phosphatase: 74 U/L (ref 38–126)
Anion gap: 5 (ref 5–15)
BUN: 9 mg/dL (ref 6–20)
CO2: 23 mmol/L (ref 22–32)
Calcium: 8.3 mg/dL — ABNORMAL LOW (ref 8.9–10.3)
Chloride: 114 mmol/L — ABNORMAL HIGH (ref 98–111)
Creatinine, Ser: 1.04 mg/dL — ABNORMAL HIGH (ref 0.44–1.00)
GFR, Estimated: >60 mL/min (ref >60)
Glucose, Bld: 174 mg/dL — ABNORMAL HIGH (ref 70–99)
Potassium: 3.2 mmol/L — ABNORMAL LOW (ref 3.5–5.1)
Sodium: 142 mmol/L (ref 135–145)
Total Bilirubin: 0.5 mg/dL (ref 0.3–1.2)
Total Protein: 5.3 g/dL — ABNORMAL LOW (ref 6.5–8.1)

## 2021-05-07 LAB — PHOSPHORUS: Phosphorus: 3.2 mg/dL (ref 2.5–4.6)

## 2021-05-07 LAB — MAGNESIUM: Magnesium: 1.7 mg/dL (ref 1.7–2.4)

## 2021-05-07 MED ORDER — POTASSIUM CHLORIDE CRYS ER 20 MEQ PO TBCR
40.0000 meq | EXTENDED_RELEASE_TABLET | Freq: Once | ORAL | Status: AC
  Administered 2021-05-07: 40 meq via ORAL
  Filled 2021-05-07: qty 2

## 2021-05-07 MED ORDER — MAGNESIUM SULFATE 50 % IJ SOLN
3.0000 g | Freq: Once | INTRAVENOUS | Status: AC
  Administered 2021-05-07: 3 g via INTRAVENOUS
  Filled 2021-05-07: qty 6

## 2021-05-07 NOTE — TOC Progression Note (Signed)
Transition of Care [facility]) - Progression Note    Patient Details  Name: [name] MRN: [id number] Date of Birth: 05-Sep-1966  Transition of Care [facility]) CM/SW Contact  Milinda Antis, [place], [place] Phone Number: 05/07/2021, 4:31 PM  Clinical Narrative:    CSW received a follow up call from [facility] [phone]).  CSW informed [name] of the information given from RN.  (That [name] will receive an update once a day from the attending and if he has any questions about being banned to contact risk management.)  CSW gave [name] the number to risk management.          Expected Discharge Plan and Services                                                 Social Determinants of Health (SDOH) Interventions    Readmission Risk Interventions Readmission Risk Prevention Plan 10/31/2020 08/27/2020  Transportation Screening Complete Complete  Medication Review Press photographer) Complete Complete  PCP or Specialist appointment within 3-5 days of discharge - Complete  HRI or [place] - Complete  SW Recovery Care/Counseling Consult Complete Complete  Palliative Care Screening Not Applicable Not Applicable  Skilled Nursing Facility Complete Complete  Some recent data might be hidden

## 2021-05-07 NOTE — Progress Notes (Addendum)
PROGRESS NOTE    [name]  JZP:[id number] DOB: 14-Jul-1966 DOA: 04/21/2021 PCP: Caprice Renshaw, MD     Brief Narrative:   [name] was admitted with the working diagnosis of sepsis due to bilateral lower extremity leg ulcerations with cellulitis and dry gangrene. Staphylococcus cutis bacteremia.    [name] PMHx MDD, Seizures, tobacco abuse, COPD, severe peripheral vascular disease, paroxysmal atrial fibrillation, HTN, Dyslipidemia, DM Type 2 with complications, Diabetic Neuropathy,  Chronic Diastolic CHF, CKD stage III,  Presented with bilateral leg pain.  She was noted to have decreased p.o. intake at her nursing facility.  She does have chronic ulcerated lesions bilateral legs.  On her initial physical examination temperature 98.1, heart rate 150, respiratory rate 22, heart rate 90, blood pressure 116/92, oxygen saturation 93%, her lungs were clear to auscultation bilaterally, heart S1-S2, present, rhythmic, soft abdomen, positive pressure ulcers ventral side of distal lower extremities.  Stage IV pressure ulcers with necrotic skin.    Patient was placed on broad-spectrum antibiotic therapy with vancomycin and cefepime. Vascular surgery and orthopedic were consulted.   Patient was scheduled to have left common femoral artery access aortogram and right lower extremity angiography but patient declined to proceed on the Cath Lab table. This was discussed with the sister, and procedure was reattempted successfully under general anesthesia.    06/29 Sp right superficial femoral and popliteal artery stenting   Positive blood cultures for staph cutis  #2/2    Patient with poor IV acces, has declined PICC line several times.    Poor oral intake and continue to decline IV, change antibiotic therapy to oral doxycycline.   07/05 has new IV and Vancomycin resumed. Plan for left leg angioplasty on 07/06.     7/13 no overnight issues.  Patient is sleepy barely opens her eyes when I  call her name and falls back asleep this AM.  Subjective: Sleeping.  As above   Assessment & Plan: Covid vaccination; vaccinated 2/3   Principal Problem:   Sepsis ([place]) Active Problems:   Essential hypertension   INSOMNIA   Medical non-compliance   Poor mobility   Lower extremity weakness   HTN (hypertension), malignant   Hypokalemia   Diabetic neuropathy (HCC)   CKD (chronic kidney disease) stage 3, GFR 30-59 ml/min (HCC)   Severe recurrent major depression without psychotic features (HCC)   COPD (chronic obstructive pulmonary disease) (HCC)   At high risk for falls   PAF (paroxysmal atrial fibrillation) (HCC)   Atrial fibrillation with RVR (HCC)   Seizures (HCC)   Sacral ulcer, limited to breakdown of skin (HCC)   Reflux esophagitis   Multiple duodenal ulcers   Chronic respiratory failure with hypoxia (HCC)   Hypomagnesemia   PVD (peripheral vascular disease) (HCC)   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  Counseled patient that if she does not allow [name] to examine her and change dressings she could lose her legs, again patient curses at myself and the nurse and tells [name] to leave her alone and God was the one who fixed her leg. -7/9 wound care: cleansing once daily with NS, gently  patting dry and covering with folded xeroform gauze [name] # [phone]).  This would be covered with an ABD pad and secured with a few turns of Kerlix roll gauze/paper tape.   7/13 the heels should be placed in Prevalon boots ([name]) to prevent pressure injury    Sepsis due to bilateral lower extremity cellulitis with  dry gangrene, in the setting of severe PVD. (Present on admission) - 7/13 appears sepsis physiology resolving.  Will need to monitor closely when patients allows     Staphylococcus bacteremia.  positive staph capitis -Blood cultures #2/2 positive for staphylococcus cutis -S/p right superficial femoral and popliteal artery stenting. -7/1 repeat set of cultures negative final -Echocardiogram with no evident vegetation. -Patient had 2 days of oral antibiotic therapy due to no IV access. Now that patient has a functioning IV access will resume vancomycin. -7/13 per ID continue vancomycin while inpatient and changed to oral doxycycline at discharge with treatment through 7/14  Wound care per Dr. Sharol [name] and Dr. Stanford [name]   Chronic respiratory failure with hypoxia - Continue with supplemental 02 per [facility].    Severe PVD -Multifactorial long-term uncontrolled diabetic, essential HTN, poor overall health.  -Maximize treatment above issues   DM type II uncontrolled with complication: DM nephropathy, PVD -5/29 hemoglobin A1c= 11.1 Lab Results  Component Value Date   GLUCAP 155 (H) 05/07/2021   GLUCAP 167 (H) 05/07/2021   GLUCAP 136 (H) 05/06/2021   GLUCAP 175 (H) 05/06/2021   GLUCAP 142 (H) 05/06/2021        Paroxysmal atrial fibrillation. -7/12 apixaban 5 mg BID (hold) drop in hemoglobin - 7/10 amlodipine 10 mg daily - Metoprolol 50 mg  BID -7/10 Metoprolol IV 7.5 mg x 1 -Transfuse for hemoglobin<7  SVT vs A. fib RVR - Replace electrolytes - 7/10 stat EKG resolved with replacement of electrolytes   Essential HTN  -See A. Fib  Dyslipidemia.       AKI on CKD stage 3b(baseline ~Cr 1.5)  Lab Results  Component Value Date   CREATININE 1.04 (H) 05/07/2021   CREATININE 1.08 (H) 05/06/2021   CREATININE 1.03 (H) 05/05/2021   CREATININE 1.11 (H) 05/04/2021   CREATININE 1.20 (H) 05/03/2021  -Baseline  Non anion gap metabolic acidosis.  -Resolved  Anemia unspecified -  7/11 anemia panel pending - 7/11 occult blood positive Lab Results  Component Value Date   HGB 8.6 (L) 05/07/2021   HGB 9.1 (L) 05/06/2021   HGB 6.7 (LL) 05/06/2021   HGB 7.4 (L) 05/05/2021   HGB 7.8 (L) 05/03/2021  -7/12 transfuse 1 unit PRBC -7/12 most likely secondary to profusely bleeding hemorrhoids   Depression.   -Patient with severe mood swings, declining medical treatment at times. -Per Psychiatry patient and son not concerned with decision making/actions, declines any further intervention -continue with lorazepam, duloxetine, trazodone, keppra. - 7/9 increase Quetiapine.  50 mg QHS  Paranoia/Anxiety - 7/8 Haldol 2 mg TID.   - 7/11 increase Haldol 4 mg QID -7/9 Ativan 2 mg PRN -7/12 EKG.  Monitor for QT interval prolongation: NO QT prolongation  Mental health issues - Schizoaffective?  Schizophrenia with paranoia?  Severe depression? - 7/9 unsure of what patient's mental health diagnosis would be however discussed patient with primary point of contact listed which is her brother Carloyn Jaeger he concurs that his sister has some sort of mental health issue.  States that he had requested psychiatric intervention during this hospitalization which had occurred (patient and son both stated there was  no problem and through psychiatrist out of room).  Brother states that son cannot even take care of himself and concurs with my assessment that he should not be making any medical decisions for patient. - 7/9 patient not competent to make medical decisions. - 7/9 reconsult psychiatry for help with management of patient's mental health issues.  If patient mental health issues or not resolved patient at serious risk of losing her legs or her life. ADDENDUM; it has been determined that son Sindy Guadeloupe does have what appear to be written forged HCPOA however since he has been actively interfering with patient care and preventing the RNs from appropriately taking care of patient after  speaking with her brother, sister and daughter during family meeting he has been banned from the hospital for the time being. - On Monday 7/11 will meet with the hospital attorneys in order to determine if patient can be made XXX and son Sindy Guadeloupe all information even over the phone be withheld from son. - As of 7/9 the family has agreed (son [name] Herbin DOES NOT have properly executed HCPOA) therefore daughter [name] will make goal healthcare decisions for patient.    Multiple pressure injuries -See below  Pressure Injury 12/03/20 Perineum Posterior Stage 1 -  Intact skin with non-blanchable redness of a localized area usually over a bony prominence. (Active)  12/03/20 2210  Location: Perineum  Location Orientation: Posterior  Staging: Stage 1 -  Intact skin with non-blanchable redness of a localized area usually over a bony prominence.  Wound Description (Comments):   Present on Admission: Yes     Pressure Injury 12/04/20 Coccyx Right;Medial Stage 2 -  Partial thickness loss of dermis presenting as a shallow open injury with a red, pink wound bed without slough. 1cm x 0.5cm area to the right of previously healed pressure ulcer (Active)  12/04/20 0800  Location: Coccyx  Location Orientation: Right;Medial  Staging: Stage 2 -  Partial thickness loss of dermis presenting as a shallow open injury with a red, pink wound bed without slough.  Wound Description (Comments): 1cm x 0.5cm area to the right of previously healed pressure ulcer  Present on Admission: Yes  -7/8 awaiting air bed  Anemia unspecified - 7/6 anemia panel pending Lab Results  Component Value Date   HGB 8.6 (L) 05/07/2021   HGB 9.1 (L) 05/06/2021   HGB 6.7 (LL) 05/06/2021   HGB 7.4 (L) 05/05/2021   HGB 7.8 (L) 05/03/2021  -7/7 hemoglobin dropped significantly overnight may be secondary to her surgery. - Transfer for hemoglobin<7 - 7/11 occult blood positive  Bleeding hemorrhoids - Anusol cream 2.5%  BID  Obese (BMI 31.24 kg/m)   Hypokalemia - Potassium goal> 4 7/13   will replace today to continue monitoring   Hypomagnesmia -Magnesium goal> 2 - 7/12 magnesium IV 2 g   ADDENDUM From Dr. Sherral Hammers note; after lengthy conversation with risk-management the following was agreed upon (per Dr. Sherral Hammers). 1.  Son [name] is banned from [address] N. 2    Son [name] will have no say in medical decisions concerning patient.  Daughter [name] will be the primary person for making medical decisions for patient. 3.  Son [name] at this time son will be informed once a day of how patient condition is faring or if there is a significant change.   DVT prophylaxis: scd Code Status: full Family Communication: none at bedside Status is: Inpatient    Dispo: The patient is from: Home  Anticipated d/c is to: SNF vs home              Anticipated d/c date is: Per vascular surgery              Patient currently unstable      Consultants:  Vascular surgery    Procedures/Significant Events:  7/1 Echocardiogram limited:Left Ventricle: Left ventricular ejection fraction, by estimation, is 60  to 65%. The left ventricle has normal function. The left ventricle has no  regional wall motion abnormalities. The left ventricular internal cavity  size was normal in size.   Right Ventricle: There is mildly elevated pulmonary artery systolic  pressure. The tricuspid regurgitant velocity is 2.90 m/s, and with an  assumed right atrial pressure of 3 mmHg, the estimated right ventricular  systolic pressure is 59.5 mmHg.   Pericardium: There is no evidence of pericardial effusion.   Mitral Valve: The mitral valve is grossly normal. Mild mitral valve  regurgitation. There is no evidence of mitral valve vegetation.   Tricuspid Valve: The tricuspid valve is grossly normal. Tricuspid valve  regurgitation is mild. There is no evidence of tricuspid valve vegetation.   Aortic Valve: The aortic valve is  normal in structure. Aortic valve  regurgitation is not visualized. There is no evidence of aortic valve  vegetation.   Pulmonic Valve: The pulmonic valve was grossly normal. Pulmonic valve  regurgitation is mild.  7/6 s/p1) US guided right common femoral access 2) left lower extremity angiogram with second order cannulation   I have personally reviewed and interpreted all radiology studies and my findings are as above.   VENTILATOR SETTINGS:    Cultures 6/27 SARS coronavirus negative 6/27 influenza A/B negative 6/27 blood RIGHT AC positive staph capitis 6/27 blood RIGHT arm positive staph capitis 7/1 blood RIGHT AC negative final 7/1 blood RIGHT hand negative final 7/12 urine pending   Antimicrobials: Anti-infectives (From admission, onward)    Start     Ordered Stop   04/30/21 2200  vancomycin (VANCOREADY) IVPB 750 mg/150 mL        04/29/21 1453     04/29/21 1545  vancomycin (VANCOREADY) IVPB 1500 mg/300 mL        04/29/21 1453 04/29/21 2314   04/27/21 1700  vancomycin (VANCOREADY) IVPB 1000 mg/200 mL  Status:  Discontinued        04/26/21 1159 04/27/21 1303   04/27/21 1700  vancomycin (VANCOCIN) IVPB 1000 mg/200 mL premix  Status:  Discontinued        04/27/21 1303 04/27/21 1331   04/27/21 1430  doxycycline (VIBRA-TABS) tablet 100 mg  Status:  Discontinued        04/27/21 1331 04/29/21 1433   04/24/21 1600  vancomycin (VANCOREADY) IVPB 1250 mg/250 mL  Status:  Discontinued        04/24/21 1519 04/26/21 1159   04/22/21 1200  vancomycin (VANCOREADY) IVPB 1250 mg/250 mL  Status:  Discontinued        04/21/21 1232 04/24/21 1519   04/21/21 2200  ceFEPIme (MAXIPIME) 2 g in sodium chloride 0.9 % 100 mL IVPB  Status:  Discontinued        04/21/21 1249 04/24/21 1411   04/21/21 1000  vancomycin (VANCOREADY) IVPB 1500 mg/300 mL        04/21/21 0948 04/21/21 1443   04/21/21 0945  ceFEPIme (MAXIPIME) 2 g in sodium chloride 0.9 % 100 mL IVPB        04/21/21 0936 04/21/21 1114  Devices    LINES / TUBES:      Continuous Infusions:  lactated ringers 10 mL/hr at 04/30/21 1255   vancomycin Stopped (05/06/21 0009)     Objective: Vitals:   05/06/21 1230 05/06/21 1500 05/06/21 1944 05/07/21 0748  BP: (!) 149/106 (!) 146/97 (!) 137/93 118/87  Pulse: 85 96 100 78  Resp: 13  18 18   Temp: 98.4 F (36.9 C) 98.8 F (37.1 C) 98.7 F (37.1 C) 98.2 F (36.8 C)  TempSrc: Oral Oral Oral Oral  SpO2: 96% 98% 98% 97%  Weight:      Height:        Intake/Output Summary (Last 24 hours) at 05/07/2021 0803 Last data filed at 05/06/2021 1230 Gross per 24 hour  Intake 315 ml  Output --  Net 315 ml   Filed Weights   04/30/21 0615 04/30/21 1236 05/01/21 0606  Weight: 75 kg 75 kg 74.7 kg    Physical Exam:  Nad, sleepy, opens eyes and then falls asleep again Anteriorly CTA no wheeze Regular S1-S2 no gallops Soft benign positive bowel sounds Trace edema Unable to assess neuro exam since patient is sleey     Data reviewed:  CBC: Recent Labs  Lab 05/01/21 0404 05/03/21 1907 05/05/21 0357 05/06/21 0416 05/06/21 1624 05/07/21 0350  WBC 9.5 8.9 8.3 10.8*  --  14.1*  NEUTROABS 7.0 6.4 5.9 8.8*  --  11.6*  HGB 7.3* 7.8* 7.4* 6.7* 9.1* 8.6*  HCT 24.7* 26.0* 25.6* 23.4* 29.7* 28.5*  MCV 95.4 93.9 97.7 97.5  --  94.7  PLT 281 259 243 259  --  856   Basic Metabolic Panel: Recent Labs  Lab 05/03/21 1907 05/04/21 0043 05/05/21 0357 05/06/21 0416 05/07/21 0350  NA 143 143 141 141 142  K 3.6 3.6 4.1 3.6 3.2*  CL 110 113* 112* 113* 114*  CO2 25 23 24 22 23   GLUCOSE 157* 115* 133* 175* 174*  BUN 9 8 7 9 9   CREATININE 1.20* 1.11* 1.03* 1.08* 1.04*  CALCIUM 8.5* 8.3* 8.3* 8.3* 8.3*  MG 1.6* 1.5* 2.0 1.8 1.7  PHOS 2.6 2.8 2.7 3.1 3.2   GFR: Estimated Creatinine Clearance: 55.9 mL/min (A) (by C-G formula based on SCr of 1.04 mg/dL (H)). Liver Function Tests: Recent Labs  Lab 05/03/21 1907 05/04/21 0043 05/05/21 0357 05/06/21 0416  05/07/21 0350  AST 10* 8* 9* 12* 9*  ALT 8 9 8 9 9   ALKPHOS 76 62 65 70 74  BILITOT 0.5 0.6 0.5 0.7 0.5  PROT 5.5* 4.9* 5.3* 5.0* 5.3*  ALBUMIN 1.6* 1.4* 1.4* 1.4* 1.6*   No results for input(s): LIPASE, AMYLASE in the last 168 hours. No results for input(s): AMMONIA in the last 168 hours. Coagulation Profile: No results for input(s): INR, PROTIME in the last 168 hours. Cardiac Enzymes: No results for input(s): CKTOTAL, CKMB, CKMBINDEX, TROPONINI in the last 168 hours. BNP (last 3 results) No results for input(s): PROBNP in the last 8760 hours. HbA1C: No results for input(s): HGBA1C in the last 72 hours. CBG: Recent Labs  Lab 05/06/21 1117 05/06/21 1622 05/06/21 2047 05/07/21 0325 05/07/21 0654  GLUCAP 142* 175* 136* 167* 155*   Lipid Profile: No results for input(s): CHOL, HDL, LDLCALC, TRIG, CHOLHDL, LDLDIRECT in the last 72 hours. Thyroid Function Tests: No results for input(s): TSH, T4TOTAL, FREET4, T3FREE, THYROIDAB in the last 72 hours. Anemia Panel: Recent Labs    05/06/21 0416  VITAMINB12 696  FOLATE 13.4  FERRITIN 253  TIBC  116*  IRON 38  RETICCTPCT 3.4*   Sepsis Labs: No results for input(s): PROCALCITON, LATICACIDVEN in the last 168 hours.  No results found for this or any previous visit (from the past 240 hour(s)).         Radiology Studies: No results found.      Scheduled Meds:  amLODipine  10 mg Oral Daily   aspirin EC  81 mg Oral Daily   Chlorhexidine Gluconate Cloth  6 each Topical Daily   DULoxetine  60 mg Oral Daily   haloperidol lactate  4 mg Intravenous Q6H   hydrocortisone   Rectal BID   insulin aspart  0-5 Units Subcutaneous QHS   insulin aspart  0-9 Units Subcutaneous TID WC   insulin aspart  3 Units Subcutaneous TID WC   levETIRAcetam  750 mg Oral BID   metoprolol tartrate  50 mg Oral BID   midazolam  2 mg Intravenous Once   oxybutynin  5 mg Oral QHS   pantoprazole  40 mg Oral Daily   QUEtiapine  50 mg Oral QHS    rosuvastatin  10 mg Oral Daily   traZODone  50 mg Oral QHS   zinc sulfate  220 mg Oral Daily   Continuous Infusions:  lactated ringers 10 mL/hr at 04/30/21 1255   vancomycin Stopped (05/06/21 0009)     LOS: 16 days    Time spent: 35 minutes with more than 50% on [place], MD [facility]   If 7PM-7AM, please contact night-coverage 05/07/2021, 8:03 AM

## 2021-05-07 NOTE — Progress Notes (Signed)
Pharmacy Antibiotic Note  [name] is a 55 y.o. female admitted on 04/21/2021 with bilateral leg pain.  Blood culture also grew Staph capitus.  ID planning on 14 days since last negative Bcx, unless discharged before and then switch to PO doxycycline. Renal function stable   Plan: Vanc 750mg  IV Q24H for AUC 482 using SCr 1.5 Monitor renal fxn, clinical progress, vanc AUC at Css  Height: 5\' 1"  (154.9 cm) Weight: 74.7 kg (164 lb 10.9 oz) IBW/kg (Calculated) : 47.8  Temp (24hrs), Avg:98.5 F (36.9 C), Min:98.2 F (36.8 C), Max:98.8 F (37.1 C)  Recent Labs  Lab 05/01/21 0404 05/03/21 1907 05/04/21 0043 05/04/21 2253 05/05/21 0357 05/06/21 0416 05/07/21 0350  WBC 9.5 8.9  --   --  8.3 10.8* 14.1*  CREATININE 1.41* 1.20* 1.11*  --  1.03* 1.08* 1.04*  VANCOTROUGH  --   --   --  28*  --   --   --   VANCOPEAK  --   --  44*  --   --   --   --      Estimated Creatinine Clearance: 55.9 mL/min (A) (by C-G formula based on SCr of 1.04 mg/dL (H)).    No Known Allergies  Vanc 6/27 >> 7/1, restart 7/5 >>  Cefepime 6/27 >> 6/30 Doxy 7/4 >> 7/5 (loss IV access)   6/27 BCx - 2/4 staph capitis from two dif bottles possible same location (ox resistant) 7/1 BCx - NGTD  Thank you for this consult. Pharmacy to sign off as stop date is entered for 7/14.   Lestine Box, PharmD PGY2 Infectious Diseases Pharmacy Resident  Please check AMION for all [facility] pharmacy phone numbers After 10:00 PM call main pharmacy [phone]

## 2021-05-07 NOTE — Progress Notes (Signed)
Pt sleeping most of the day today and refusing to eat for me. When I ask why, she states she does not want to.

## 2021-05-08 DIAGNOSIS — R6520 Severe sepsis without septic shock: Secondary | ICD-10-CM | POA: Diagnosis not present

## 2021-05-08 DIAGNOSIS — Z9181 History of falling: Secondary | ICD-10-CM | POA: Diagnosis not present

## 2021-05-08 DIAGNOSIS — I4891 Unspecified atrial fibrillation: Secondary | ICD-10-CM | POA: Diagnosis not present

## 2021-05-08 DIAGNOSIS — N179 Acute kidney failure, unspecified: Secondary | ICD-10-CM | POA: Diagnosis not present

## 2021-05-08 DIAGNOSIS — A419 Sepsis, unspecified organism: Secondary | ICD-10-CM | POA: Diagnosis not present

## 2021-05-08 DIAGNOSIS — F331 Major depressive disorder, recurrent, moderate: Secondary | ICD-10-CM | POA: Diagnosis not present

## 2021-05-08 DIAGNOSIS — I70263 Atherosclerosis of native arteries of extremities with gangrene, bilateral legs: Secondary | ICD-10-CM | POA: Diagnosis not present

## 2021-05-08 LAB — COMPREHENSIVE METABOLIC PANEL
ALT: 8 U/L (ref 0–44)
AST: 9 U/L — ABNORMAL LOW (ref 15–41)
Albumin: 1.3 g/dL — ABNORMAL LOW (ref 3.5–5.0)
Alkaline Phosphatase: 69 U/L (ref 38–126)
Anion gap: 4 — ABNORMAL LOW (ref 5–15)
BUN: 8 mg/dL (ref 6–20)
CO2: 24 mmol/L (ref 22–32)
Calcium: 8 mg/dL — ABNORMAL LOW (ref 8.9–10.3)
Chloride: 115 mmol/L — ABNORMAL HIGH (ref 98–111)
Creatinine, Ser: 1.02 mg/dL — ABNORMAL HIGH (ref 0.44–1.00)
GFR, Estimated: >60 mL/min (ref >60)
Glucose, Bld: 107 mg/dL — ABNORMAL HIGH (ref 70–99)
Potassium: 4.1 mmol/L (ref 3.5–5.1)
Sodium: 143 mmol/L (ref 135–145)
Total Bilirubin: 0.8 mg/dL (ref 0.3–1.2)
Total Protein: 4.8 g/dL — ABNORMAL LOW (ref 6.5–8.1)

## 2021-05-08 LAB — CBC WITH DIFFERENTIAL/PLATELET
Abs Immature Granulocytes: 0.03 10*3/uL (ref 0.00–0.07)
Basophils Absolute: 0.1 10*3/uL (ref 0.0–0.1)
Basophils Relative: 1 %
Eosinophils Absolute: 0.3 10*3/uL (ref 0.0–0.5)
Eosinophils Relative: 3 %
HCT: 26.6 % — ABNORMAL LOW (ref 36.0–46.0)
Hemoglobin: 8.1 g/dL — ABNORMAL LOW (ref 12.0–15.0)
Immature Granulocytes: 0 %
Lymphocytes Relative: 19 %
Lymphs Abs: 1.7 10*3/uL (ref 0.7–4.0)
MCH: 28.9 pg (ref 26.0–34.0)
MCHC: 30.5 g/dL (ref 30.0–36.0)
MCV: 95 fL (ref 80.0–100.0)
Monocytes Absolute: 0.6 10*3/uL (ref 0.1–1.0)
Monocytes Relative: 7 %
Neutro Abs: 6.2 10*3/uL (ref 1.7–7.7)
Neutrophils Relative %: 70 %
Platelets: 263 10*3/uL (ref 150–400)
RBC: 2.8 MIL/uL — ABNORMAL LOW (ref 3.87–5.11)
RDW: 18.1 % — ABNORMAL HIGH (ref 11.5–15.5)
WBC: 8.9 10*3/uL (ref 4.0–10.5)
nRBC: 0 % (ref 0.0–0.2)

## 2021-05-08 LAB — GLUCOSE, CAPILLARY
Glucose-Capillary: 112 mg/dL — ABNORMAL HIGH (ref 70–99)
Glucose-Capillary: 102 mg/dL — ABNORMAL HIGH (ref 70–99)
Glucose-Capillary: 125 mg/dL — ABNORMAL HIGH (ref 70–99)
Glucose-Capillary: 102 mg/dL — ABNORMAL HIGH (ref 70–99)

## 2021-05-08 LAB — MAGNESIUM: Magnesium: 2.1 mg/dL (ref 1.7–2.4)

## 2021-05-08 LAB — PHOSPHORUS: Phosphorus: 3 mg/dL (ref 2.5–4.6)

## 2021-05-08 MED ORDER — BENZTROPINE MESYLATE 0.5 MG PO TABS
0.5000 mg | ORAL_TABLET | Freq: Two times a day (BID) | ORAL | Status: DC
  Administered 2021-05-08 – 2021-05-29 (×38): 0.5 mg via ORAL
  Filled 2021-05-08 (×46): qty 1

## 2021-05-08 MED ORDER — HALOPERIDOL 5 MG PO TABS
5.0000 mg | ORAL_TABLET | Freq: Three times a day (TID) | ORAL | Status: DC
  Administered 2021-05-08 – 2021-05-29 (×56): 5 mg via ORAL
  Filled 2021-05-08 (×68): qty 1

## 2021-05-08 NOTE — Progress Notes (Signed)
PROGRESS NOTE    [name]  ONG:[id number] DOB: August 08, 1966 DOA: 04/21/2021 PCP: Caprice Renshaw, MD    Brief Narrative:  [name] was admitted with the working diagnosis of sepsis due to bilateral lower extremity leg ulcerations with cellulitis and dry gangrene. Staphylococcus cutis bacteremia.    [name] PMHx MDD, Seizures, tobacco abuse, COPD, severe peripheral vascular disease, paroxysmal atrial fibrillation, HTN, Dyslipidemia, DM Type 2 with complications, Diabetic Neuropathy,  Chronic Diastolic CHF, CKD stage III,   Presented with bilateral leg pain.  She was noted to have decreased p.o. intake at her nursing facility.  She does have chronic ulcerated lesions bilateral legs.  On her initial physical examination temperature 98.1, heart rate 150, respiratory rate 22, heart rate 90, blood pressure 116/92, oxygen saturation 93%, her lungs were clear to auscultation bilaterally, heart S1-S2, present, rhythmic, soft abdomen, positive pressure ulcers ventral side of distal lower extremities.  Stage IV pressure ulcers with necrotic skin.    Patient was placed on broad-spectrum antibiotic therapy with vancomycin and cefepime. Vascular surgery and orthopedic were consulted.   Patient was scheduled to have left common femoral artery access aortogram and right lower extremity angiography but patient declined to proceed on the Cath Lab table. This was discussed with the sister, and procedure was reattempted successfully under general anesthesia.    06/29 Sp right superficial femoral and popliteal artery stenting   Positive blood cultures for staph cutis  #2/2    Patient with poor IV acces, has declined PICC line several times.    Poor oral intake and continue to decline IV, change antibiotic therapy to oral doxycycline.   07/05 has new IV and Vancomycin resumed. Plan for left leg angioplasty on 07/06.   7/14- pt more awake this am, talking on the phone. Reports will eat breaksfast  when gets the chance . No cp, sob, or chilles  Consultants:  Orthopedics, ID, psychiatry  Procedures:   Antimicrobials:  Vancomycin   6/27 SARS coronavirus negative 6/27 influenza A/B negative 6/27 blood RIGHT AC positive staph capitis 6/27 blood RIGHT arm positive staph capitis 7/1 blood RIGHT AC negative final 7/1 blood RIGHT hand negative final 7/12 urine pending     Subjective: No chills, chest pain or abdominal pain  Objective: Vitals:   05/06/21 1944 05/07/21 0748 05/07/21 1344 05/07/21 2149  BP: (!) 137/93 118/87 124/86 117/85  Pulse: 100 78 79 81  Resp: _0 Temp: 98.7 F (37.1 C) 98.2 F (36.8 C) 98 F (36.7 C) 98.4 F (36.9 C)  TempSrc: Oral Oral Oral Oral  SpO2: 98% 97% 100% 95%  Weight:      Height:        Intake/Output Summary (Last 24 hours) at 05/08/2021 0708 Last data filed at 05/08/2021 0500 Gross per 24 hour  Intake 106 ml  Output 925 ml  Net -819 ml   Filed Weights   04/30/21 0615 04/30/21 1236 05/01/21 0606  Weight: 75 kg 75 kg 74.7 kg    Examination:  General exam: Appears calm, talking on the phone Respiratory system: Clear to auscultation. Respiratory effort normal. Cardiovascular system: S1 & S2 heard, RRR. No gallops  Gastrointestinal system: Abdomen is nondistended, soft and nontender. Normal bowel sounds heard. Central nervous system: Alert and alert.  Grossly intact  Extremities: No edema Skin warm and dry   Data Reviewed: I have personally reviewed following labs and imaging studies  CBC: Recent Labs  Lab 05/03/21 1907 05/05/21 0357 05/06/21 0416 05/06/21  1624 05/07/21 0350 05/08/21 0459  WBC 8.9 8.3 10.8*  --  14.1* 8.9  NEUTROABS 6.4 5.9 8.8*  --  11.6* 6.2  HGB 7.8* 7.4* 6.7* 9.1* 8.6* 8.1*  HCT 26.0* 25.6* 23.4* 29.7* 28.5* 26.6*  MCV 93.9 97.7 97.5  --  94.7 95.0  PLT 259 243 259  --  276 903   Basic Metabolic Panel: Recent Labs  Lab 05/04/21 0043 05/05/21 0357 05/06/21 0416 05/07/21 0350  05/08/21 0459  NA 143 141 141 142 143  K 3.6 4.1 3.6 3.2* 4.1  CL 113* 112* 113* 114* 115*  CO2 _0 GLUCOSE 115* 133* 175* 174* 107*  BUN _1 CREATININE 1.11* 1.03* 1.08* 1.04* 1.02*  CALCIUM 8.3* 8.3* 8.3* 8.3* 8.0*  MG 1.5* 2.0 1.8 1.7 2.1  PHOS 2.8 2.7 3.1 3.2 3.0   GFR: Estimated Creatinine Clearance: 57 mL/min (A) (by C-G formula based on SCr of 1.02 mg/dL (H)). Liver Function Tests: Recent Labs  Lab 05/04/21 0043 05/05/21 0357 05/06/21 0416 05/07/21 0350 05/08/21 0459  AST 8* 9* 12* 9* 9*  ALT _2 ALKPHOS 62 65 70 74 69  BILITOT 0.6 0.5 0.7 0.5 0.8  PROT 4.9* 5.3* 5.0* 5.3* 4.8*  ALBUMIN 1.4* 1.4* 1.4* 1.6* 1.3*   No results for input(s): LIPASE, AMYLASE in the last 168 hours. No results for input(s): AMMONIA in the last 168 hours. Coagulation Profile: No results for input(s): INR, PROTIME in the last 168 hours. Cardiac Enzymes: No results for input(s): CKTOTAL, CKMB, CKMBINDEX, TROPONINI in the last 168 hours. BNP (last 3 results) No results for input(s): PROBNP in the last 8760 hours. HbA1C: No results for input(s): HGBA1C in the last 72 hours. CBG: Recent Labs  Lab 05/07/21 0325 05/07/21 0654 05/07/21 1120 05/07/21 1640 05/07/21 2151  GLUCAP 167* 155* 96 130* 166*   Lipid Profile: No results for input(s): CHOL, HDL, LDLCALC, TRIG, CHOLHDL, LDLDIRECT in the last 72 hours. Thyroid Function Tests: No results for input(s): TSH, T4TOTAL, FREET4, T3FREE, THYROIDAB in the last 72 hours. Anemia Panel: Recent Labs    05/06/21 0416  VITAMINB12 696  FOLATE 13.4  FERRITIN 253  TIBC 116*  IRON 38  RETICCTPCT 3.4*   Sepsis Labs: No results for input(s): PROCALCITON, LATICACIDVEN in the last 168 hours.  No results found for this or any previous visit (from the past 240 hour(s)).       Radiology Studies: No results found.      Scheduled Meds:  amLODipine  10 mg Oral Daily   aspirin EC  81 mg Oral Daily    Chlorhexidine Gluconate Cloth  6 each Topical Daily   DULoxetine  60 mg Oral Daily   haloperidol lactate  4 mg Intravenous Q6H   hydrocortisone   Rectal BID   insulin aspart  0-5 Units Subcutaneous QHS   insulin aspart  0-9 Units Subcutaneous TID WC   insulin aspart  3 Units Subcutaneous TID WC   levETIRAcetam  750 mg Oral BID   metoprolol tartrate  50 mg Oral BID   midazolam  2 mg Intravenous Once   oxybutynin  5 mg Oral QHS   pantoprazole  40 mg Oral Daily   QUEtiapine  50 mg Oral QHS   rosuvastatin  10 mg Oral Daily   traZODone  50 mg Oral QHS   zinc sulfate  220 mg Oral Daily   Continuous Infusions:  lactated ringers 10  mL/hr at 04/30/21 1255    Assessment & Plan:   Principal Problem:   Sepsis ([place]) Active Problems:   Essential hypertension   INSOMNIA   Medical non-compliance   Poor mobility   Lower extremity weakness   HTN (hypertension), malignant   Hypokalemia   Diabetic neuropathy (HCC)   CKD (chronic kidney disease) stage 3, GFR 30-59 ml/min (HCC)   Severe recurrent major depression without psychotic features (HCC)   COPD (chronic obstructive pulmonary disease) (HCC)   At high risk for falls   PAF (paroxysmal atrial fibrillation) (HCC)   Atrial fibrillation with RVR (HCC)   Seizures (HCC)   Sacral ulcer, limited to breakdown of skin (HCC)   Reflux esophagitis   Multiple duodenal ulcers   Chronic respiratory failure with hypoxia (HCC)   Hypomagnesemia   PVD (peripheral vascular disease) (HCC)   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  This  would be covered with an ABD pad and secured with a few turns of Kerlix roll gauze/paper tape.   7/14 he will should be placed in Prevalon boots  Kellie Simmering [phone]) to prevent pressure injury         Staphylococcus bacteremia.  positive staph capitis -Blood cultures #2/2 positive for staphylococcus cutis -S/p right superficial femoral and popliteal artery stenting. -7/1 repeat set of cultures negative final -Echocardiogram with no evident vegetation. -Patient had 2 days of oral antibiotic therapy due to no IV access. Now that patient has a functioning IV access will resume vancomycin. 7/14 per ID continue vancomycin while inpatient and changed to oral doxycycline at discharge with treatment through 7/14 Wound care per Dr. Sharol Given and Dr. Stanford Breed     Chronic respiratory failure with hypoxia Continue with supplemental O2 per  Resolved     Paroxysmal atrial fibrillation. -7/12 apixaban 5 mg BID (hold) drop in hemoglobin On aspirin and beta-blocker    Severe PVD -Multifactorial long-term uncontrolled diabetic, essential HTN, poor overall health. -Maximize treatment above issues     DM type II uncontrolled with complication: DM nephropathy, PVD -5/29 hemoglobin A1c= 11.1        SVT vs A. fib RVR - Replace electrolytes - 7/10 stat EKG resolved with replacement of electrolytes   Essential HTN  On beta-blockers   Dyslipidemia.     On statin   AKI on CKD stage 3b -Baseline   Non anion gap metabolic acidosis.  -Resolved   Anemia unspecified Occult blood positive H&H stable 7/12 transfused 1 unit packed red blood cell Most likely secondary to profusely bleeding hemorrhoids    Depression.   -Patient with severe mood swings, declining medical treatment at times. -Per Psychiatry patient and son not concerned with decision making/actions, declines any further intervention -continue with lorazepam, duloxetine, trazodone, keppra. Quetiapine.  50 mg QHS Psychiatry  consulted pending   Paranoia/Anxiety - 7/8 Haldol 2 mg TID.   - 7/11 increase Haldol 4 mg QID -7/9 Ativan 2 mg PRN -7/12 EKG.  Monitor for QT interval prolongation: NO QT prolongation Psychiatry consulted   Multiple pressure injuries Air mattress   Hypomagnesemia Hypokalemia Replace and monitor  Bleeding hemorrhoids  Anusol cream 2.5% BID   Mental health issues - Schizoaffective?  Schizophrenia with paranoia?  Severe depression? - 7/9 unsure of what patient's mental health diagnosis would be however discussed patient with primary point of contact listed which is her brother Carloyn Jaeger he concurs that his sister has some sort of mental health issue.  States that he had requested psychiatric intervention during this hospitalization which had occurred (patient and son both stated there was no problem and through psychiatrist out of room).  Brother states that son cannot even take care of himself and concurs with my assessment that he should not be making any medical decisions for patient. - 7/9 patient not competent to make medical decisions. - 7/9 reconsult psychiatry for help with management of patient's mental health issues.  If patient mental health issues or not resolved patient at serious risk of losing her legs or her life. Per previous hospitalist documentation it has been determined that son Sindy Guadeloupe does have what appear to be written forged HCPOA however since he has been actively interfering with patient care and preventing the RNs from appropriately taking care of patient after speaking with her brother, sister and daughter during family meeting he has been banned from the hospital for the time being. - On Monday 7/11 met with the hospital attorneys in order to determine if patient can be made XXX and son Sindy Guadeloupe all information even over the phone be withheld from son. - As of 7/9 the family has agreed (son [name] Herbin DOES NOT have properly executed HCPOA)  therefore daughter [name] will make goal healthcare decisions for patient.       From Dr. Sherral Hammers note; after lengthy conversation with risk-management the following was agreed upon (per Dr. Sherral Hammers). 1.  Son [name] is banned from [address]. 2    Son [name] will have no say in medical decisions concerning patient.  Daughter [name] will be the primary person for making medical decisions for patient. 3.  Son [name] at this time son will be informed once a day of how patient condition is faring or if there is a significant change.     DVT prophylaxis: SCD Code Status: Full Family Communication: None at bedside Disposition Plan:  Status is: Inpatient  Remains inpatient appropriate because:Inpatient level of care appropriate due to severity of illness  Dispo: The patient is from: Home              Anticipated d/c is to:  SNF versus home              Patient currently is not medically stable to d/c.  Per vascular surgery   Difficult to place patient No            LOS: 17 days   Time spent: 35 minutes with more than 50% on [place], MD [facility] Pager [phone]  If 7PM-7AM, please contact night-coverage 05/08/2021, 7:08 AM

## 2021-05-08 NOTE — Consult Note (Addendum)
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 progress in treatment MDD,recurrent, severe, w/ psychosis Patient appears depressed and was having VH prior  to the addition of an antipsychotic medication. Patient appears to be doing well on Haldol IV and has not had reoccurrence of VH and is sleeping. Will transition medications to PO. With the above likely dx patient should not require 2 SGAs. It is possible that the early mood instability that was being reported was 2/2 to starting Keppra as patient herself reported she was not taking this and did not realize she was prescribed this OP. - Discontinue IV Haldol 4mg  q6h - Start 5mg  Haldol PO TID - Discontinue Seroquel 50mg  QHS - Recommend EKG  -  Start Cogentin 0.5mg  BID, for EPS prevention  Will continue to follow. Disposition: No evidence of imminent risk to self or others at present.    PGY-2 Freida Busman, MD 05/08/2021 10:00 AM

## 2021-05-08 NOTE — TOC Progression Note (Addendum)
Transition of Care [facility]) - Progression Note    Patient Details  Name: [name] MRN: [id number] Date of Birth: 07/05/1966  Transition of [facility] CM/SW Contact  Milinda Antis, [place] Phone Number: 05/08/2021, 11:47 AM  Clinical Narrative:    10:08-  CSW received notification that the patient wanted to speak with CSW. CSW met with the patient at bedside.  The patient wanted CSW to speak with her son, [name], because he was banned from the hospital.  The patient informed CSW that she "signed some papers for him to get her medical records."  CSW informed the patient that the son would need to speak with Risk Management about this issue.    The patient called her daughter [name] so that CSW could give the daughter an update. CSW inquired about the SNF that the patient was attending prior to admission.  The patient was oriented to place, person, and situation.  The patient reported that she did not want to go back to [place], but wanted to "get out of here".  The daughter also stated that they would prefer that the patient go to another facility.  The daughter wants the patient to receive rehab at a facility.  CSW informed the patient and daughter that CSW will follow up with the facility and other facilities to check for availability.    The patient then called her son, [name], and asked that CSW speak with him.  CSW reiterated the same information given to the son yesterday and encouraged the son to call risk management with questions about him being banned from the hospital.  15:39-  CSW contacted [place] with [employer].  There was no answer.  CSW is awaiting a returned call.          Expected Discharge Plan and Services                                                 Social Determinants of Health (SDOH) Interventions    Readmission Risk Interventions Readmission Risk Prevention Plan 10/31/2020 08/27/2020  Transportation Screening Complete Complete  Medication  Review Press photographer) Complete Complete  PCP or Specialist appointment within 3-5 days of discharge - Complete  HRI or [place] - Complete  SW Recovery Care/Counseling Consult Complete Complete  Palliative Care Screening Not Applicable Not Applicable  Skilled Nursing Facility Complete Complete  Some recent data might be hidden

## 2021-05-08 NOTE — Plan of Care (Signed)

## 2021-05-09 DIAGNOSIS — A419 Sepsis, unspecified organism: Secondary | ICD-10-CM | POA: Diagnosis not present

## 2021-05-09 DIAGNOSIS — R6520 Severe sepsis without septic shock: Secondary | ICD-10-CM | POA: Diagnosis not present

## 2021-05-09 DIAGNOSIS — F331 Major depressive disorder, recurrent, moderate: Secondary | ICD-10-CM | POA: Diagnosis not present

## 2021-05-09 DIAGNOSIS — N179 Acute kidney failure, unspecified: Secondary | ICD-10-CM | POA: Diagnosis not present

## 2021-05-09 LAB — PHOSPHORUS: Phosphorus: 3.2 mg/dL (ref 2.5–4.6)

## 2021-05-09 LAB — COMPREHENSIVE METABOLIC PANEL
ALT: 9 U/L (ref 0–44)
AST: 9 U/L — ABNORMAL LOW (ref 15–41)
Albumin: 1.5 g/dL — ABNORMAL LOW (ref 3.5–5.0)
Alkaline Phosphatase: 76 U/L (ref 38–126)
Anion gap: 6 (ref 5–15)
BUN: 8 mg/dL (ref 6–20)
CO2: 22 mmol/L (ref 22–32)
Calcium: 8.4 mg/dL — ABNORMAL LOW (ref 8.9–10.3)
Chloride: 114 mmol/L — ABNORMAL HIGH (ref 98–111)
Creatinine, Ser: 1.09 mg/dL — ABNORMAL HIGH (ref 0.44–1.00)
GFR, Estimated: 60 mL/min — ABNORMAL LOW (ref >60)
Glucose, Bld: 98 mg/dL (ref 70–99)
Potassium: 3.9 mmol/L (ref 3.5–5.1)
Sodium: 142 mmol/L (ref 135–145)
Total Bilirubin: 0.6 mg/dL (ref 0.3–1.2)
Total Protein: 5.4 g/dL — ABNORMAL LOW (ref 6.5–8.1)

## 2021-05-09 LAB — CBC WITH DIFFERENTIAL/PLATELET
Abs Immature Granulocytes: 0.03 10*3/uL (ref 0.00–0.07)
Basophils Absolute: 0.1 10*3/uL (ref 0.0–0.1)
Basophils Relative: 1 %
Eosinophils Absolute: 0.2 10*3/uL (ref 0.0–0.5)
Eosinophils Relative: 3 %
HCT: 27.6 % — ABNORMAL LOW (ref 36.0–46.0)
Hemoglobin: 8 g/dL — ABNORMAL LOW (ref 12.0–15.0)
Immature Granulocytes: 0 %
Lymphocytes Relative: 20 %
Lymphs Abs: 1.8 10*3/uL (ref 0.7–4.0)
MCH: 27.8 pg (ref 26.0–34.0)
MCHC: 29 g/dL — ABNORMAL LOW (ref 30.0–36.0)
MCV: 95.8 fL (ref 80.0–100.0)
Monocytes Absolute: 0.6 10*3/uL (ref 0.1–1.0)
Monocytes Relative: 7 %
Neutro Abs: 6.2 10*3/uL (ref 1.7–7.7)
Neutrophils Relative %: 69 %
Platelets: 270 10*3/uL (ref 150–400)
RBC: 2.88 MIL/uL — ABNORMAL LOW (ref 3.87–5.11)
RDW: 17.5 % — ABNORMAL HIGH (ref 11.5–15.5)
WBC: 8.9 10*3/uL (ref 4.0–10.5)
nRBC: 0 % (ref 0.0–0.2)

## 2021-05-09 LAB — URINE CULTURE

## 2021-05-09 LAB — GLUCOSE, CAPILLARY
Glucose-Capillary: 96 mg/dL (ref 70–99)
Glucose-Capillary: 119 mg/dL — ABNORMAL HIGH (ref 70–99)
Glucose-Capillary: 131 mg/dL — ABNORMAL HIGH (ref 70–99)
Glucose-Capillary: 142 mg/dL — ABNORMAL HIGH (ref 70–99)
Glucose-Capillary: 142 mg/dL — ABNORMAL HIGH (ref 70–99)

## 2021-05-09 LAB — MAGNESIUM: Magnesium: 1.7 mg/dL (ref 1.7–2.4)

## 2021-05-09 MED ORDER — APIXABAN 5 MG PO TABS
5.0000 mg | ORAL_TABLET | Freq: Two times a day (BID) | ORAL | Status: DC
  Administered 2021-05-09 – 2021-05-21 (×24): 5 mg via ORAL
  Filled 2021-05-09 (×26): qty 1

## 2021-05-09 MED ORDER — TIOTROPIUM BROMIDE MONOHYDRATE 18 MCG IN CAPS: 18.0000 ug | ORAL_CAPSULE | Freq: Every day | RESPIRATORY_TRACT | Status: DC

## 2021-05-09 MED ORDER — MAGNESIUM SULFATE 2 GM/50ML IV SOLN
2.0000 g | Freq: Once | INTRAVENOUS | Status: AC
  Administered 2021-05-09: 2 g via INTRAVENOUS
  Filled 2021-05-09: qty 50

## 2021-05-09 MED ORDER — FLUOXETINE HCL 20 MG PO CAPS
20.0000 mg | ORAL_CAPSULE | Freq: Every day | ORAL | Status: DC
  Administered 2021-05-10 – 2021-05-28 (×19): 20 mg via ORAL
  Filled 2021-05-09 (×21): qty 1

## 2021-05-09 MED ORDER — UMECLIDINIUM BROMIDE 62.5 MCG/INH IN AEPB
1.0000 | INHALATION_SPRAY | Freq: Every day | RESPIRATORY_TRACT | Status: DC
  Administered 2021-05-19 – 2021-05-22 (×2): 1 via RESPIRATORY_TRACT
  Filled 2021-05-09 (×2): qty 7

## 2021-05-09 NOTE — Plan of Care (Signed)

## 2021-05-09 NOTE — Consult Note (Addendum)
[facility] Face-to-Face Psychiatry Consult   Reason for Consult:  Mood dysregulation Referring Physician:  Nolberto Hanlon, MD Patient Identification: [name] MRN:  [id number] Principal Diagnosis: Sepsis [facility]) Diagnosis:  Principal Problem:   Sepsis ([place]) Active Problems:   Essential hypertension   INSOMNIA   Medical non-compliance   Poor mobility   Lower extremity weakness   HTN (hypertension), malignant   Hypokalemia   Diabetic neuropathy (HCC)   CKD (chronic kidney disease) stage 3, GFR 30-59 ml/min (HCC)   Severe recurrent major depression without psychotic features ([place])   COPD (chronic obstructive pulmonary disease) (HCC)   At high risk for falls   PAF (paroxysmal atrial fibrillation) (HCC)   Atrial fibrillation with RVR (HCC)   Seizures (HCC)   Sacral ulcer, limited to breakdown of skin (HCC)   Reflux esophagitis   Multiple duodenal ulcers   Chronic respiratory failure with hypoxia (HCC)   Hypomagnesemia   PVD (peripheral vascular disease) (HCC)   Atherosclerosis of native artery of both legs with gangrene (HCC)   MDD (major depressive disorder), recurrent episode, moderate (HCC)   Bleeding hemorrhoids   Total Time spent with patient: 15 minutes  Subjective:   [name] is a 55 y.o. female patient admitted with  bilateral leg pain w/ PMH of depression, tobacco use disorder, severe PVD, paroxysmal aticoagulant, HLD, HTN, uncontrolled T2DM, CHF, CKD. Marland Kitchen  HPI:  Patient reports she is not in the mood to talk but reports she will answer a few questions. Patient reports she is sleeping ok but still has no appetite. Provider is able to get patient to agree to at least drink Ensure. Patient denies SI, HI, and AVH but reports she is depressed and does not think her Cymbalta is helping. Patient reports she wants to stop the medication and is agreeable to trying Prozac again as she cannot recall having any negative side effects with this medication or failure of the  medication.   Past Psychiatric History: Reports OP psych 4-5 visits total. Also report being dx with "Clinical depression" Recalls being on haldol for depression. Recalls taking Paxil and Prozac in the past.  Of note patient was not taking her Keppra prior to hospitalization despite having a seizure in 07/2020.   Risk to Self:  NO Risk to Others:  NO Prior Inpatient Therapy:  NO Prior Outpatient Therapy:  YES  Past Medical History:  Past Medical History:  Diagnosis Date   Alcohol use    Ankle fracture, lateral malleolus, closed 2013   Anxiety    Aortic atherosclerosis ([place]) 03/10/2021   Breast mass, left 2013   CHF (congestive heart failure) ([place])    a. EF 20-25% by echo in 05/2016 with cath showing normal cors b. EF 50-55% in 07/2020 c. 01/2021: EF at 55-60% with moderate LVH   Chronic anemia    CKD (chronic kidney disease), stage III (HCC)    Cocaine abuse (HCC)    COPD (chronic obstructive pulmonary disease) ([place])    Diabetes mellitus, type 2 ([place])    Diabetic Charcot foot ([place])    Essential hypertension    History of cardiomyopathy    History of GI bleed 2011   Hyperlipidemia    Noncompliance    Obesity    PAF (paroxysmal atrial fibrillation) (HCC)    Panic attacks    PAT (paroxysmal atrial tachycardia) (HCC)    Previously on Amiodarone   Seizures ([place]) 10/01/2020   Sleep apnea    Not on CPAP   Stroke (  [place]) 2018   Tobacco abuse    Urinary incontinence     Past Surgical History:  Procedure Laterality Date   ANGIOPLASTY ILLIAC ARTERY Right 04/23/2021   Procedure: ANGIOPLASTY AND STENT SUPERFICIAL FEMORAL ARTERY;  Surgeon: Cherre Robins, MD;  Location: [place];  Service: Vascular;  Laterality: Right;   AORTOGRAM  04/23/2021   Procedure: AORTOGRAM;  Surgeon: Cherre Robins, MD;  Location: [place];  Service: Vascular;;   AORTOGRAM Left 04/30/2021   Procedure: left lower extremity angiogram with second order cannulation;  Surgeon: Cherre Robins, MD;  Location: [place];   Service: Vascular;  Laterality: Left;   BIOPSY  11/12/2020   Procedure: BIOPSY;  Surgeon: Harvel Quale, MD;  [facility];  Service: Gastroenterology;;   BREAST BIOPSY     CARDIAC CATHETERIZATION N/A 07/28/2016   Procedure: Left Heart Cath and Coronary Angiography;  Surgeon: Jettie Booze, MD;  Location: [place] [facility];  Service: Cardiovascular;  Laterality: N/A;   COLONOSCOPY N/A 05/10/2019   Procedure: COLONOSCOPY;  Surgeon: Danie Binder, MD;  [facility];  Service: Endoscopy;  Laterality: N/A;  Phenergan 12.5 mg IV in pre-op   DILATION AND CURETTAGE OF UTERUS     ESOPHAGOGASTRODUODENOSCOPY (EGD) WITH PROPOFOL N/A 11/12/2020   Procedure: ESOPHAGOGASTRODUODENOSCOPY (EGD) WITH PROPOFOL;  Surgeon: Harvel Quale, MD;  [facility];  Service: Gastroenterology;  Laterality: N/A;   I & D EXTREMITY Bilateral 09/22/2017   Procedure: BILATERAL DEBRIDEMENT LEG/FOOT ULCERS, APPLY VERAFLO WOUND VAC;  Surgeon: Newt Minion, MD;  Location: [place];  Service: Orthopedics;  Laterality: Bilateral;   I & D EXTREMITY Right 10/11/2018   Procedure: IRRIGATION AND DEBRIDEMENT RIGHT HAND;  Surgeon: [name], MD;  Location: [place];  Service: Orthopedics;  Laterality: Right;   I & D EXTREMITY Right 10/13/2018   Procedure: REPEAT IRRIGATION AND DEBRIDEMENT RIGHT HAND;  Surgeon: [name], MD;  Location: [place];  Service: Orthopedics;  Laterality: Right;   I & D EXTREMITY Right 11/22/2018   Procedure: IRRIGATION AND DEBRIDEMENT AND PINNING RIGHT HAND;  Surgeon: [name], MD;  Location: [place];  Service: Orthopedics;  Laterality: Right;   IR RADIOLOGIST EVAL & MGMT  07/05/2018   LOWER EXTREMITY ANGIOGRAM Bilateral 04/23/2021   Procedure: RIGHT  AND LEFT LOWER EXTREMITY ANGIOGRAM;  Surgeon: Cherre Robins, MD;  Location: [place];  Service: Vascular;  Laterality: Bilateral;   POLYPECTOMY  05/10/2019   Procedure: POLYPECTOMY;  Surgeon: Danie Binder, MD;  [facility];  Service: Endoscopy;;   SKIN SPLIT GRAFT Bilateral 09/28/2017   Procedure: BILATERAL SPLIT THICKNESS SKIN GRAFT LEGS/FEET AND APPLY VAC;  Surgeon: Newt Minion, MD;  Location: [place];  Service: Orthopedics;  Laterality: Bilateral;   SKIN SPLIT GRAFT Right 11/22/2018   Procedure: SKIN GRAFT SPLIT THICKNESS;  Surgeon: [name], MD;  Location: [place];  Service: Orthopedics;  Laterality: Right;   Family History:  Family History  Problem Relation Age of Onset   Hypertension Mother    Heart attack Mother    Hypertension Father        CABG    Hypertension Sister    Hypertension Brother    Hypertension Sister    Cancer Sister        breast    Arthritis Other    Cancer Other    Diabetes Other    Asthma Other    Hypertension Daughter    Hypertension Son  Family Psychiatric  History: Denies Social History:  Social History   Substance and Sexual Activity  Alcohol Use Not Currently   Alcohol/week: 0.0 standard drinks   Comment: Quit in 2017     Social History   Substance and Sexual Activity  Drug Use Not Currently   Frequency: 3.0 times per week   Types: Marijuana, Cocaine   Comment: none since August 2018    Social History   Socioeconomic History   Marital status: Widowed    Spouse name: Not on file   Number of children: 2   Years of education: 9   Highest education level: 9th grade  Occupational History   Occupation: Disabled  Tobacco Use   Smoking status: Some Days    Packs/day: 0.25    Years: 20.00    Pack years: 5.00    Types: Cigarettes    Last attempt to quit: 06/03/2017    Years since quitting: 3.9   Smokeless tobacco: Never  Vaping Use   Vaping Use: Never used  Substance and Sexual Activity   Alcohol use: Not Currently    Alcohol/week: 0.0 standard drinks    Comment: Quit in 2017   Drug use: Not Currently    Frequency: 3.0 times per week    Types: Marijuana, Cocaine    Comment: none since August 2018    Sexual activity: Not on file  Other Topics Concern   Not on file  Social History Narrative   Right handed   Drinks 1-2 cups caffeine daily   Social Determinants of Health   Financial Resource Strain: Medium Risk   Difficulty of Paying Living Expenses: Somewhat hard  Food Insecurity: Food Insecurity Present   Worried About Charity fundraiser in the Last Year: Sometimes true   Ran Out of Food in the Last Year: Sometimes true  Transportation Needs: No Transportation Needs   Lack of Transportation (Medical): No   Lack of Transportation (Non-Medical): No  Physical Activity: Insufficiently Active   Days of Exercise per Week: 7 days   Minutes of Exercise per Session: 20 min  Stress: Stress Concern Present   Feeling of Stress : To some extent  Social Connections: Moderately Isolated   Frequency of Communication with Friends and Family: More than three times a week   Frequency of Social Gatherings with Friends and Family: More than three times a week   Attends Religious Services: More than 4 times per year   Active Member of [employer] or Organizations: No   Attends Archivist Meetings: Never   Marital Status: Widowed   Additional Social History:    Allergies:  No Known Allergies  Labs:  Results for orders placed or performed during the hospital encounter of 04/21/21 (from the past 48 hour(s))  Glucose, capillary     Status: Abnormal   Collection Time: 05/07/21  4:40 PM  Result Value Ref Range   Glucose-Capillary 130 (H) 70 - 99 mg/dL    Comment: Glucose reference range applies only to samples taken after fasting for at least 8 hours.  Glucose, capillary     Status: Abnormal   Collection Time: 05/07/21  9:51 PM  Result Value Ref Range   Glucose-Capillary 166 (H) 70 - 99 mg/dL    Comment: Glucose reference range applies only to samples taken after fasting for at least 8 hours.  CBC with Differential/Platelet     Status: Abnormal   Collection Time: 05/08/21  4:59 AM   Result Value Ref Range  WBC 8.9 4.0 - 10.5 K/uL   RBC 2.80 (L) 3.87 - 5.11 MIL/uL   Hemoglobin 8.1 (L) 12.0 - 15.0 g/dL   HCT 26.6 (L) 36.0 - 46.0 %   MCV 95.0 80.0 - 100.0 fL   MCH 28.9 26.0 - 34.0 pg   MCHC 30.5 30.0 - 36.0 g/dL   RDW 18.1 (H) 11.5 - 15.5 %   Platelets 263 150 - 400 K/uL   nRBC 0.0 0.0 - 0.2 %   Neutrophils Relative % 70 %   Neutro Abs 6.2 1.7 - 7.7 K/uL   Lymphocytes Relative 19 %   Lymphs Abs 1.7 0.7 - 4.0 K/uL   Monocytes Relative 7 %   Monocytes Absolute 0.6 0.1 - 1.0 K/uL   Eosinophils Relative 3 %   Eosinophils Absolute 0.3 0.0 - 0.5 K/uL   Basophils Relative 1 %   Basophils Absolute 0.1 0.0 - 0.1 K/uL   Immature Granulocytes 0 %   Abs Immature Granulocytes 0.03 0.00 - 0.07 K/uL    Comment: Performed at [facility] [address], [place], [place] [address]  Comprehensive metabolic panel     Status: Abnormal   Collection Time: 05/08/21  4:59 AM  Result Value Ref Range   Sodium 143 135 - 145 mmol/L   Potassium 4.1 3.5 - 5.1 mmol/L   Chloride 115 (H) 98 - 111 mmol/L   CO2 24 22 - 32 mmol/L   Glucose, Bld 107 (H) 70 - 99 mg/dL    Comment: Glucose reference range applies only to samples taken after fasting for at least 8 hours.   BUN 8 6 - 20 mg/dL   Creatinine, Ser 1.02 (H) 0.44 - 1.00 mg/dL    Comment: DELTA CHECK NOTED   Calcium 8.0 (L) 8.9 - 10.3 mg/dL   Total Protein 4.8 (L) 6.5 - 8.1 g/dL   Albumin 1.3 (L) 3.5 - 5.0 g/dL   AST 9 (L) 15 - 41 U/L   ALT 8 0 - 44 U/L   Alkaline Phosphatase 69 38 - 126 U/L   Total Bilirubin 0.8 0.3 - 1.2 mg/dL   GFR, Estimated >60 >60 mL/min    Comment: (NOTE) Calculated using the CKD-EPI Creatinine Equation (2021)    Anion gap 4 (L) 5 - 15    Comment: Performed at [facility], [place] [address] [place], [place] [address]  Magnesium     Status: None   Collection Time: 05/08/21  4:59 AM  Result Value Ref Range   Magnesium 2.1 1.7 - 2.4 mg/dL    Comment: Performed at [facility] [address] [place], [place] [address]  Phosphorus     Status: None   Collection Time: 05/08/21  4:59 AM  Result Value Ref Range   Phosphorus 3.0 2.5 - 4.6 mg/dL    Comment: Performed at [facility] [address] [place], [place] [address]  Culture, Urine     Status: Abnormal   Collection Time: 05/08/21  5:10 AM   Specimen: Urine, Random  Result Value Ref Range   Specimen Description URINE, RANDOM    Special Requests      NONE Performed at [facility], [place] [address] [place],  [address]    Culture MULTIPLE SPECIES PRESENT, SUGGEST RECOLLECTION (A)    Report Status 05/09/2021 FINAL   Glucose, capillary     Status: Abnormal   Collection Time: 05/08/21  7:39 AM  Result Value Ref Range  Glucose-Capillary 112 (H) 70 - 99 mg/dL    Comment: Glucose reference range applies only to samples taken after fasting for at least 8 hours.  Glucose, capillary     Status: Abnormal   Collection Time: 05/08/21 11:39 AM  Result Value Ref Range   Glucose-Capillary 102 (H) 70 - 99 mg/dL    Comment: Glucose reference range applies only to samples taken after fasting for at least 8 hours.  Glucose, capillary     Status: Abnormal   Collection Time: 05/08/21  5:27 PM  Result Value Ref Range   Glucose-Capillary 125 (H) 70 - 99 mg/dL    Comment: Glucose reference range applies only to samples taken after fasting for at least 8 hours.  Glucose, capillary     Status: Abnormal   Collection Time: 05/08/21  9:01 PM  Result Value Ref Range   Glucose-Capillary 102 (H) 70 - 99 mg/dL    Comment: Glucose reference range applies only to samples taken after fasting for at least 8 hours.  Glucose, capillary     Status: None   Collection Time: 05/09/21  3:26 AM  Result Value Ref Range   Glucose-Capillary 96 70 - 99 mg/dL    Comment: Glucose reference range applies only to samples taken after fasting for at least 8 hours.  CBC with Differential/Platelet     Status: Abnormal   Collection Time:  05/09/21  4:40 AM  Result Value Ref Range   WBC 8.9 4.0 - 10.5 K/uL   RBC 2.88 (L) 3.87 - 5.11 MIL/uL   Hemoglobin 8.0 (L) 12.0 - 15.0 g/dL   HCT 27.6 (L) 36.0 - 46.0 %   MCV 95.8 80.0 - 100.0 fL   MCH 27.8 26.0 - 34.0 pg   MCHC 29.0 (L) 30.0 - 36.0 g/dL   RDW 17.5 (H) 11.5 - 15.5 %   Platelets 270 150 - 400 K/uL   nRBC 0.0 0.0 - 0.2 %   Neutrophils Relative % 69 %   Neutro Abs 6.2 1.7 - 7.7 K/uL   Lymphocytes Relative 20 %   Lymphs Abs 1.8 0.7 - 4.0 K/uL   Monocytes Relative 7 %   Monocytes Absolute 0.6 0.1 - 1.0 K/uL   Eosinophils Relative 3 %   Eosinophils Absolute 0.2 0.0 - 0.5 K/uL   Basophils Relative 1 %   Basophils Absolute 0.1 0.0 - 0.1 K/uL   Immature Granulocytes 0 %   Abs Immature Granulocytes 0.03 0.00 - 0.07 K/uL    Comment: Performed at [facility], [address], [place],  [address]  Comprehensive metabolic panel     Status: Abnormal   Collection Time: 05/09/21  4:40 AM  Result Value Ref Range   Sodium 142 135 - 145 mmol/L   Potassium 3.9 3.5 - 5.1 mmol/L   Chloride 114 (H) 98 - 111 mmol/L   CO2 22 22 - 32 mmol/L   Glucose, Bld 98 70 - 99 mg/dL    Comment: Glucose reference range applies only to samples taken after fasting for at least 8 hours.   BUN 8 6 - 20 mg/dL   Creatinine, Ser 1.09 (H) 0.44 - 1.00 mg/dL   Calcium 8.4 (L) 8.9 - 10.3 mg/dL   Total Protein 5.4 (L) 6.5 - 8.1 g/dL   Albumin 1.5 (L) 3.5 - 5.0 g/dL   AST 9 (L) 15 - 41 U/L   ALT 9 0 - 44 U/L   Alkaline Phosphatase 76 38 - 126 U/L  Total Bilirubin 0.6 0.3 - 1.2 mg/dL   GFR, Estimated 60 (L) >60 mL/min    Comment: (NOTE) Calculated using the CKD-EPI Creatinine Equation (2021)    Anion gap 6 5 - 15    Comment: Performed at [facility] [address] [place], [place] [address]  Magnesium     Status: None   Collection Time: 05/09/21  4:40 AM  Result Value Ref Range   Magnesium 1.7 1.7 - 2.4 mg/dL    Comment: Performed at [facility] [address]  [place], [place] [address]  Phosphorus     Status: None   Collection Time: 05/09/21  4:40 AM  Result Value Ref Range   Phosphorus 3.2 2.5 - 4.6 mg/dL    Comment: Performed at [facility] [address] [place], [place] [address]  Glucose, capillary     Status: Abnormal   Collection Time: 05/09/21  5:43 AM  Result Value Ref Range   Glucose-Capillary 119 (H) 70 - 99 mg/dL    Comment: Glucose reference range applies only to samples taken after fasting for at least 8 hours.  Glucose, capillary     Status: Abnormal   Collection Time: 05/09/21  8:19 AM  Result Value Ref Range   Glucose-Capillary 131 (H) 70 - 99 mg/dL    Comment: Glucose reference range applies only to samples taken after fasting for at least 8 hours.   *Note: Due to a large number of results and/or encounters for the requested time period, some results have not been displayed. A complete set of results can be found in Results Review.    Current Facility-Administered Medications  Medication Dose Route Frequency Provider Last Rate Last Admin   acetaminophen (TYLENOL) tablet 650 mg  650 mg Oral Q6H PRN Wynetta Emery, Clanford L, MD   650 mg at 05/06/21 2132   Or   acetaminophen (TYLENOL) suppository 650 mg  650 mg Rectal Q6H PRN Johnson, Clanford L, MD       amLODipine (NORVASC) tablet 10 mg  10 mg Oral Daily Johnson, Clanford L, MD   10 mg at 05/09/21 0825   aspirin EC tablet 81 mg  81 mg Oral Daily Dagoberto Ligas, PA-C   81 mg at 05/09/21 0825   benztropine (COGENTIN) tablet 0.5 mg  0.5 mg Oral BID Damita Dunnings B, MD   0.5 mg at 05/09/21 4696   Chlorhexidine Gluconate Cloth 2 % PADS 6 each  6 each Topical Daily Allie Bossier, MD   6 each at 05/09/21 0827   FLUoxetine (PROZAC) capsule 20 mg  20 mg Oral Daily Damita Dunnings B, MD       haloperidol (HALDOL) tablet 5 mg  5 mg Oral TID Damita Dunnings B, MD   5 mg at 05/09/21 0824   hydrocortisone (ANUSOL-HC) 2.5 % rectal cream   Rectal BID Allie Bossier, MD   Given at 05/09/21  0828   insulin aspart (novoLOG) injection 0-5 Units  0-5 Units Subcutaneous QHS Johnson, Clanford L, MD       insulin aspart (novoLOG) injection 0-9 Units  0-9 Units Subcutaneous TID WC Johnson, Clanford L, MD   1 Units at 05/09/21 0827   insulin aspart (novoLOG) injection 3 Units  3 Units Subcutaneous TID WC Wynetta Emery, Clanford L, MD   3 Units at 05/09/21 2952   lactated ringers infusion   Intravenous Continuous Annye Asa, MD 10 mL/hr at 04/30/21 1255 Restarted at 04/30/21 1430   levETIRAcetam (KEPPRA)  tablet 750 mg  750 mg Oral BID Wynetta Emery, Clanford L, MD   750 mg at 05/09/21 7341   lip balm (CARMEX) ointment   Topical PRN Cristal Ford, DO       LORazepam (ATIVAN) injection 2 mg  2 mg Intravenous Q6H PRN Allie Bossier, MD   2 mg at 05/05/21 2311   metoprolol tartrate (LOPRESSOR) tablet 50 mg  50 mg Oral BID Wynetta Emery, Clanford L, MD   50 mg at 05/09/21 0825   midazolam (VERSED) injection 2 mg  2 mg Intravenous Once Allie Bossier, MD       morphine 2 MG/ML injection 2 mg  2 mg Intravenous Q4H PRN Cristal Ford, DO   2 mg at 05/04/21 1219   ondansetron (ZOFRAN) tablet 4 mg  4 mg Oral Q6H PRN Johnson, Clanford L, MD       Or   ondansetron (ZOFRAN) injection 4 mg  4 mg Intravenous Q6H PRN Johnson, Clanford L, MD       oxybutynin (DITROPAN-XL) 24 hr tablet 5 mg  5 mg Oral QHS Johnson, Clanford L, MD   5 mg at 05/08/21 2054   oxyCODONE (Oxy IR/ROXICODONE) immediate release tablet 5-10 mg  5-10 mg Oral Q6H PRN Cristal Ford, DO   10 mg at 05/09/21 1108   pantoprazole (PROTONIX) EC tablet 40 mg  40 mg Oral Daily Johnson, Clanford L, MD   40 mg at 05/09/21 0825   rosuvastatin (CRESTOR) tablet 10 mg  10 mg Oral Daily Johnson, Clanford L, MD   10 mg at 05/09/21 0825   sodium chloride flush (NS) 0.9 % injection 10-40 mL  10-40 mL Intracatheter PRN Allie Bossier, MD   10 mL at 05/06/21 0626   traZODone (DESYREL) tablet 50 mg  50 mg Oral QHS Johnson, Clanford L, MD   50 mg at 05/08/21 2052    zinc sulfate capsule 220 mg  220 mg Oral Daily Johnson, Clanford L, MD   220 mg at 05/09/21 0825    Musculoskeletal: Strength & Muscle Tone: decreased Gait & Station:  remains in bed on exam Patient leans: N/A            Psychiatric Specialty Exam:  Presentation  General Appearance: Appropriate for Environment  Eye Contact:None  Speech:Slow  Speech Volume:Decreased  Handedness: No data recorded  Mood and Affect  Mood:Irritable  Affect:Depressed   Thought Process  Thought Processes:Coherent  Descriptions of Associations:Intact  Orientation:Partial (intact to person, place, and siutation gets year incorrect says it is 2021)  Thought Content:Logical  History of Schizophrenia/Schizoaffective disorder:No data recorded Duration of Psychotic Symptoms:No data recorded Hallucinations:Hallucinations: None  Ideas of Reference:None  Suicidal Thoughts:Suicidal Thoughts: No  Homicidal Thoughts:Homicidal Thoughts: No   Sensorium  Memory:Immediate Fair; Recent Poor; Remote Poor  Judgment:Impaired  Insight:Shallow   Executive Functions  Concentration:Fair  Attention Span:Fair  [place]   Psychomotor Activity  Psychomotor Activity:Psychomotor Activity: Psychomotor Retardation   Assets  Assets:Resilience   Sleep  Sleep:Sleep: Fair   Physical Exam: Physical Exam HENT:     Head: Normocephalic and atraumatic.  Eyes:     Extraocular Movements: Extraocular movements intact.     Conjunctiva/sclera: Conjunctivae normal.  Cardiovascular:     Rate and Rhythm: Normal rate.  Pulmonary:     Effort: Pulmonary effort is normal.     Breath sounds: Normal breath sounds.  Abdominal:     General: Abdomen is flat.  Musculoskeletal:  General: No swelling.  Skin:    General: Skin is warm.  Neurological:     General: No focal deficit present.     Mental Status: She is alert.   Review of Systems   Constitutional:  Negative for chills and fever.  HENT:  Negative for hearing loss.   Eyes:  Negative for blurred vision.  Respiratory:  Negative for cough and wheezing.   Cardiovascular:  Negative for chest pain.  Gastrointestinal:  Negative for abdominal pain.  Musculoskeletal:  Positive for myalgias.  Neurological:  Negative for dizziness.  Psychiatric/Behavioral:  Positive for depression. The patient does not have insomnia.   Blood pressure 118/80, pulse 78, temperature 99 F (37.2 C), temperature source Oral, resp. rate 16, height 5\' 1"  (1.549 m), weight 74.7 kg, last menstrual period 05/19/2016, SpO2 97 %. Body mass index is 31.12 kg/m.  Treatment Plan Summary: Medication management MDD,recurrent, severe, w/ psychosis Patient continues to endorse depressed mood and at times refusing certain interventions such as changing her wound bandages. Patient is sleeping better and more willing to compromise than prior to starting Haldol. However, it is clear that Cymbalta is not the best medication for patient as she was able to have this depressive episode despite endorsing compliance prior to hospitalization and known compliance during hospitalization.  Patient QTC is 443, WNL.  - Discontinue Cymbalta 60mg  - Start Prozac 20mg  - Continue Haldol 5mg  PO TID - Continue cogentin 0.5mg  BID - Recommend Ensure if PO intake remains an issue Disposition: No evidence of imminent risk to self or others at present.   Will continue to follow.  PGY-2 Freida Busman, MD 05/09/2021 1:56 PM

## 2021-05-09 NOTE — Care Management Important Message (Signed)
Important Message  Patient Details  Name: [name] MRN: [id number] Date of Birth: 1966/08/09   Medicare Important Message Given:  Yes - Important Message mailed due to current National Emergency   Verbal consent obtained due to current National Emergency  Relationship to patient: Child Contact Name: Ms Donita Brooks Call Date: 05/09/21  Time: 1119 Phone: [phone] Outcome: Spoke with contact Important Message mailed to: Patient address on file    Delorse Lek 05/09/2021, 11:19 AM

## 2021-05-09 NOTE — Progress Notes (Signed)
[facility] PROGRESS NOTE    [name]  HUT:[id number] DOB: 03-Jan-1966 DOA: 04/21/2021 PCP: Caprice Renshaw, MD      Brief Narrative:  [name] is a 55 y.o. F with atrial fibrillation on Eliquis, seizures, smoking, COPD, peripheral vascular disease, HTN, DM, D CHF, and CKD who presented with bilateral leg pain from her nursing facility.  Found to have sepsis due to bilateral lower extremity leg ulcerations with cellulitis and dry gangrene as well as Staphylococcus cutis bacteremia.  See longer progress note by Dr. Sherral Hammers 7/12       Assessment & Plan:  Sepsis Resolved  Peripheral vascular disease Hypertension Vascular Surgery and Dr. Sharol Given were consulted.  Medical treatment for now.    Complicated course given questions about patient capacity, HCPOA decision maker, see Dr. Sherral Hammers notes from 7/12.  Blood pressure controlled - Continue amlodipine, metoprolol -Continue aspirin, Crestor  Diabetes Glucose normal - Continue sliding scale corrections  Anemia of chronic disease Due to infection, chronic respiratory failure.  Iron replete.  Possibly exacerbated by hemorrhoids as she has had some hemorrhoidal bleeding here. Hemoglobin has been stable - Resume apixaban  Hemorrhoids Topical hydrocortisone  Coag negative staph bacteremia Consulted, patient has completed antibiotic therapy.  Chronic respiratory failure with hypoxia COPD No wheezing -Resume Spiriva  Paroxysmal atrial fibrillation Rate controlled -Resume apixaban - Continue metoprolol - Keep magnesium greater than 2, potassium greater than 4  Mood disorder - Continue Haldol, benztropine, trazodone, fluoxetine  AKI on CKD stage IIIb Creatinine up to 1.7 in the hospital, improved to baseline 1.1 with holding nephrotoxins and IV fluids   History of seizures No recent seizures - Continue Keppra  Perineum stage I pressure injury, present on arrival Coccyx right, stage II pressure  injury, present on admission  Incontinence - Continue oxybutynin  GERD - Continue pantoprazole   Obesity  BMI 31     Disposition: Status is: Inpatient  Remains inpatient appropriate because:Unsafe d/c plan  Dispo: The patient is from: SNF              Anticipated d/c is to: SNF              Patient currently is medically stable to d/c.   Difficult to place patient Yes       Level of care: Med-Surg       MDM: The below labs and imaging reports were reviewed and summarized above.  Medication management as above. Resuming anticoagulation    DVT prophylaxis: Place and maintain sequential compression device Start: 05/06/21 1002 apixaban (ELIQUIS) tablet 5 mg  Code Status:  FULL          Subjective: No fever.  Has bilateral ankle pain.  No headache, chest pain, dyspnea, nausea, confusion.  No drainage or swelling.  Objective: Vitals:   05/08/21 2104 05/09/21 0821 05/09/21 0825 05/09/21 1006  BP: 119/81 (!) 128/91  118/80  Pulse:  (!) 45 77 78  Resp: 16   16  Temp: 98.7 F (37.1 C) 98.7 F (37.1 C)  99 F (37.2 C)  TempSrc: Oral Oral  Oral  SpO2: 100% 100%  97%  Weight:      Height:        Intake/Output Summary (Last 24 hours) at 05/09/2021 1518 Last data filed at 05/09/2021 0300 Gross per 24 hour  Intake --  Output 500 ml  Net -500 ml   Filed Weights   04/30/21 0615 04/30/21 1236 05/01/21 0606  Weight: 75 kg 75  kg 74.7 kg    Examination: General appearance: Thin elderly adult female, alert and in no acute distress.   HEENT: Anicteric, conjunctiva pink, lids and lashes normal. No nasal deformity, discharge, epistaxis.  Lips moist, edentulous, OP tacky dry, no oral lesions, hearing normal.   Skin: Warm and dry.  No jaundice.  No suspicious rashes or lesions.  Ankle lesions wrapped. Cardiac: RRR, nl S1-S2, no murmurs appreciated.  Capillary refill is brisk.  No LE edema.  Radial  pulses 2+ and symmetric. Respiratory: Normal respiratory rate  and rhythm.  CTAB without rales or wheezes. Abdomen: Abdomen soft.  No TTP or guarding. No ascites, distension, hepatosplenomegaly.   MSK: No deformities or effusions.Reduced subcutaneous muscle mass and fat, thenar wasting noted. Neuro: Awake and alert.  EOMI, moves upper extremities with severe generalized weakness. Speech fluent.    Psych: Sensorium intact and responding to questions, attention normal. Affect normal.  Judgment and insight appearimpaired.    Data Reviewed: I have personally reviewed following labs and imaging studies:  CBC: Recent Labs  Lab 05/05/21 0357 05/06/21 0416 05/06/21 1624 05/07/21 0350 05/08/21 0459 05/09/21 0440  WBC 8.3 10.8*  --  14.1* 8.9 8.9  NEUTROABS 5.9 8.8*  --  11.6* 6.2 6.2  HGB 7.4* 6.7* 9.1* 8.6* 8.1* 8.0*  HCT 25.6* 23.4* 29.7* 28.5* 26.6* 27.6*  MCV 97.7 97.5  --  94.7 95.0 95.8  PLT 243 259  --  276 263 382   Basic Metabolic Panel: Recent Labs  Lab 05/05/21 0357 05/06/21 0416 05/07/21 0350 05/08/21 0459 05/09/21 0440  NA 141 141 142 143 142  K 4.1 3.6 3.2* 4.1 3.9  CL 112* 113* 114* 115* 114*  CO2 24 22 23 24 22   GLUCOSE 133* 175* 174* 107* 98  BUN 7 9 9 8 8   CREATININE 1.03* 1.08* 1.04* 1.02* 1.09*  CALCIUM 8.3* 8.3* 8.3* 8.0* 8.4*  MG 2.0 1.8 1.7 2.1 1.7  PHOS 2.7 3.1 3.2 3.0 3.2   GFR: Estimated Creatinine Clearance: 53.3 mL/min (A) (by C-G formula based on SCr of 1.09 mg/dL (H)). Liver Function Tests: Recent Labs  Lab 05/05/21 0357 05/06/21 0416 05/07/21 0350 05/08/21 0459 05/09/21 0440  AST 9* 12* 9* 9* 9*  ALT 8 9 9 8 9   ALKPHOS 65 70 74 69 76  BILITOT 0.5 0.7 0.5 0.8 0.6  PROT 5.3* 5.0* 5.3* 4.8* 5.4*  ALBUMIN 1.4* 1.4* 1.6* 1.3* 1.5*   No results for input(s): LIPASE, AMYLASE in the last 168 hours. No results for input(s): AMMONIA in the last 168 hours. Coagulation Profile: No results for input(s): INR, PROTIME in the last 168 hours. Cardiac Enzymes: No results for input(s): CKTOTAL, CKMB,  CKMBINDEX, TROPONINI in the last 168 hours. BNP (last 3 results) No results for input(s): PROBNP in the last 8760 hours. HbA1C: No results for input(s): HGBA1C in the last 72 hours. CBG: Recent Labs  Lab 05/08/21 1727 05/08/21 2101 05/09/21 0326 05/09/21 0543 05/09/21 0819  GLUCAP 125* 102* 96 119* 131*   Lipid Profile: No results for input(s): CHOL, HDL, LDLCALC, TRIG, CHOLHDL, LDLDIRECT in the last 72 hours. Thyroid Function Tests: No results for input(s): TSH, T4TOTAL, FREET4, T3FREE, THYROIDAB in the last 72 hours. Anemia Panel: No results for input(s): VITAMINB12, FOLATE, FERRITIN, TIBC, IRON, RETICCTPCT in the last 72 hours. Urine analysis:    Component Value Date/Time   COLORURINE STRAW (A) 04/01/2021 1732   APPEARANCEUR CLEAR 04/01/2021 1732   LABSPEC 1.008 04/01/2021 1732   PHURINE 6.0  04/01/2021 1732   GLUCOSEU 50 (A) 04/01/2021 1732   HGBUR NEGATIVE 04/01/2021 1732   BILIRUBINUR NEGATIVE 04/01/2021 1732   KETONESUR NEGATIVE 04/01/2021 1732   PROTEINUR 100 (A) 04/01/2021 1732   UROBILINOGEN 0.2 09/11/2014 1319   NITRITE NEGATIVE 04/01/2021 1732   LEUKOCYTESUR SMALL (A) 04/01/2021 1732   Sepsis Labs: @LABRCNTIP (procalcitonin:4,lacticacidven:4)  ) Recent Results (from the past 240 hour(s))  Culture, Urine     Status: Abnormal   Collection Time: 05/08/21  5:10 AM   Specimen: Urine, Random  Result Value Ref Range Status   Specimen Description URINE, RANDOM  Final   Special Requests   Final    NONE Performed at [facility], [place] [address], [place], [place] [address]    Culture MULTIPLE SPECIES PRESENT, SUGGEST RECOLLECTION (A)  Final   Report Status 05/09/2021 FINAL  Final         Radiology Studies: No results found.      Scheduled Meds:  amLODipine  10 mg Oral Daily   apixaban  5 mg Oral BID   aspirin EC  81 mg Oral Daily   benztropine  0.5 mg Oral BID   Chlorhexidine Gluconate Cloth  6 each Topical Daily   FLUoxetine  20 mg Oral  Daily   haloperidol  5 mg Oral TID   hydrocortisone   Rectal BID   insulin aspart  0-5 Units Subcutaneous QHS   insulin aspart  0-9 Units Subcutaneous TID WC   insulin aspart  3 Units Subcutaneous TID WC   levETIRAcetam  750 mg Oral BID   metoprolol tartrate  50 mg Oral BID   midazolam  2 mg Intravenous Once   oxybutynin  5 mg Oral QHS   pantoprazole  40 mg Oral Daily   rosuvastatin  10 mg Oral Daily   traZODone  50 mg Oral QHS   zinc sulfate  220 mg Oral Daily   Continuous Infusions:  lactated ringers 10 mL/hr at 04/30/21 1255   magnesium sulfate bolus IVPB       LOS: 18 days    Time spent: 35 minmutes    Edwin Dada, MD [facility] 05/09/2021, 3:18 PM     Please page though [place] or Epic secure chat:  For [employer], Adult nurse

## 2021-05-09 NOTE — TOC Progression Note (Signed)
Transition of Care [facility]) - Progression Note    Patient Details  Name: [name] MRN: [id number] Date of Birth: 08-02-1966  Transition of Care [facility]) CM/SW Contact  Milinda Antis, [place] Phone Number: 05/09/2021, 1:03 PM  Clinical Narrative:    CSW spoke with [name] at [facility] and was informed that the patient's son called and said that the patient was not returning.  CSW asked if the patient could return and was informed that the patient can return when the facility has a bed available.  The facility is currently full.    CSW met with the patient in the room.  CSW attempted to speak with patient, but today patient was oriented to person and disoriented to time, situation, and place.  The patient thought that she was home in her living room.  Due to the patient being disoriented CSW called the patient's daughter [name].    CSW informed [name] that the patient is Long Term at [facility] and that the patient could return.  The daughter asked if CSW would check to see if [facility] had a long term bed available as this is where the family would like for the patient to go.  CSW called Kerri with [facility] and was informed that the facility is not accepting any new admissions at this time.  CSW called the daughter, [name], back and informed her of the information given from the facility.  CSW informed the family that the patient is medically ready and can go back to [facility] when a bed is available.  CSW informed the family that if they thought of another possible facility that they would like to look into prior to the patient discharging, CSW would assist.    Pending bed availability at [facility].          Expected Discharge Plan and Services                                                 Social Determinants of Health (SDOH) Interventions    Readmission Risk Interventions Readmission Risk Prevention Plan 10/31/2020 08/27/2020  Transportation Screening Complete  Complete  Medication Review [employer]) Complete Complete  PCP or Specialist appointment within 3-5 days of discharge - Complete  HRI or [place] - Complete  SW Recovery Care/Counseling Consult Complete Complete  Palliative Care Screening Not Applicable Not Applicable  Skilled Nursing Facility Complete Complete  Some recent data might be hidden

## 2021-05-09 NOTE — Progress Notes (Signed)
Patient refused dressing changes this morning.

## 2021-05-10 DIAGNOSIS — I4891 Unspecified atrial fibrillation: Secondary | ICD-10-CM | POA: Diagnosis not present

## 2021-05-10 LAB — PHOSPHORUS: Phosphorus: 3.7 mg/dL (ref 2.5–4.6)

## 2021-05-10 LAB — COMPREHENSIVE METABOLIC PANEL
ALT: 9 U/L (ref 0–44)
AST: 8 U/L — ABNORMAL LOW (ref 15–41)
Albumin: 1.5 g/dL — ABNORMAL LOW (ref 3.5–5.0)
Alkaline Phosphatase: 78 U/L (ref 38–126)
Anion gap: 4 — ABNORMAL LOW (ref 5–15)
BUN: 6 mg/dL (ref 6–20)
CO2: 24 mmol/L (ref 22–32)
Calcium: 8.5 mg/dL — ABNORMAL LOW (ref 8.9–10.3)
Chloride: 112 mmol/L — ABNORMAL HIGH (ref 98–111)
Creatinine, Ser: 0.96 mg/dL (ref 0.44–1.00)
GFR, Estimated: >60 mL/min (ref >60)
Glucose, Bld: 129 mg/dL — ABNORMAL HIGH (ref 70–99)
Potassium: 3.9 mmol/L (ref 3.5–5.1)
Sodium: 140 mmol/L (ref 135–145)
Total Bilirubin: 0.4 mg/dL (ref 0.3–1.2)
Total Protein: 5.2 g/dL — ABNORMAL LOW (ref 6.5–8.1)

## 2021-05-10 LAB — CBC WITH DIFFERENTIAL/PLATELET
Abs Immature Granulocytes: 0.02 10*3/uL (ref 0.00–0.07)
Basophils Absolute: 0.1 10*3/uL (ref 0.0–0.1)
Basophils Relative: 1 %
Eosinophils Absolute: 0.3 10*3/uL (ref 0.0–0.5)
Eosinophils Relative: 3 %
HCT: 29.1 % — ABNORMAL LOW (ref 36.0–46.0)
Hemoglobin: 8.6 g/dL — ABNORMAL LOW (ref 12.0–15.0)
Immature Granulocytes: 0 %
Lymphocytes Relative: 17 %
Lymphs Abs: 1.4 10*3/uL (ref 0.7–4.0)
MCH: 28.3 pg (ref 26.0–34.0)
MCHC: 29.6 g/dL — ABNORMAL LOW (ref 30.0–36.0)
MCV: 95.7 fL (ref 80.0–100.0)
Monocytes Absolute: 0.6 10*3/uL (ref 0.1–1.0)
Monocytes Relative: 8 %
Neutro Abs: 6.1 10*3/uL (ref 1.7–7.7)
Neutrophils Relative %: 71 %
Platelets: 289 10*3/uL (ref 150–400)
RBC: 3.04 MIL/uL — ABNORMAL LOW (ref 3.87–5.11)
RDW: 17.2 % — ABNORMAL HIGH (ref 11.5–15.5)
WBC: 8.5 10*3/uL (ref 4.0–10.5)
nRBC: 0 % (ref 0.0–0.2)

## 2021-05-10 LAB — GLUCOSE, CAPILLARY
Glucose-Capillary: 137 mg/dL — ABNORMAL HIGH (ref 70–99)
Glucose-Capillary: 376 mg/dL — ABNORMAL HIGH (ref 70–99)
Glucose-Capillary: 78 mg/dL (ref 70–99)
Glucose-Capillary: 114 mg/dL — ABNORMAL HIGH (ref 70–99)

## 2021-05-10 LAB — MAGNESIUM: Magnesium: 1.9 mg/dL (ref 1.7–2.4)

## 2021-05-10 NOTE — Progress Notes (Signed)
PROGRESS NOTE  [name] EHU:[id number] DOB: Jan 13, 1966 DOA: 04/21/2021 PCP: Caprice Renshaw, MD  HPI/Recap of past 86 hours:  55 year old female with past medical history significant for atrial fibrillation on Eliquis, seizure disorder, was tobacco abuse, COPD, peripheral vascular disease, hypertension, diabetes mellitus, diastolic CHF, and chronic kidney disease who presented with bilateral leg pain from her nursing facility.  She was found to have sepsis due to bilateral lower extremity leg ulcer with cellulitis and dry gangrene as well as Staphylococcus bacteremia  May 10, 2021: Patient seen and examined at bedside denies any new complaint  Assessment/Plan: Principal Problem:   Sepsis ([place]) Active Problems:   Essential hypertension   INSOMNIA   Medical non-compliance   Poor mobility   Lower extremity weakness   HTN (hypertension), malignant   Hypokalemia   Diabetic neuropathy (HCC)   CKD (chronic kidney disease) stage 3, GFR 30-59 ml/min (HCC)   Severe recurrent major depression without psychotic features (HCC)   COPD (chronic obstructive pulmonary disease) (HCC)   At high risk for falls   PAF (paroxysmal atrial fibrillation) (HCC)   Atrial fibrillation with RVR (HCC)   Seizures (HCC)   Sacral ulcer, limited to breakdown of skin (HCC)   Reflux esophagitis   Multiple duodenal ulcers   Chronic respiratory failure with hypoxia (HCC)   Hypomagnesemia   PVD (peripheral vascular disease) (HCC)   Atherosclerosis of native artery of both legs with gangrene ([place])   MDD (major depressive disorder), recurrent episode, moderate (HCC)   Bleeding hemorrhoids  #1 sepsis resolved  2.  Peripheral vascular disease and hypertension Continue amlodipine Vascular surgery consulted  3.  Major depressive disorder recurrent severe with psychosis.  Appreciate psychiatric consult and recommendation she was started on Prozac yesterday 20 mg, continue Haldol 5 mg 3 times daily  p.o. Continue with Cogentin 0.5 mg twice a day. Cymbalta was discontinued  4.  Diabetes mellitus glucose is normal Continue sliding scale  5.  Anemia of chronic disease Continue iron Hemoglobin has been stable Apixaban resumed  6.  Hemorrhoid She had an episode of bleeding Continue topical hydrocortisone  7.  Chronic respiratory failure with hypoxia  8.  Paroxysmal atrial fibrillation. Continue apixaban continue metoprolol Continue monitoring of potassium and magnesium to keep out the appropriate levels  8.  Acute on chronic kidney disease stage IIIb Improved with holding of nephrotoxins and IV fluid Current creatinine is 0.96 we will follow Continue to monitor  9.  Poor oral intake we will start her on Ensure supplement  Code Status: Full  Severity of Illness: The appropriate patient status for this patient is INPATIENT. Inpatient status is judged to be reasonable and necessary in order to provide the required intensity of service to ensure the patient's safety. The patient's presenting symptoms, physical exam findings, and initial radiographic and laboratory data in the context of their chronic comorbidities is felt to place them at high risk for further clinical deterioration. Furthermore, it is not anticipated that the patient will be medically stable for discharge from the hospital within 2 midnights of admission. The following factors support the patient status of inpatient.    * I certify that at the point of admission it is my clinical judgment that the patient will require inpatient hospital care spanning beyond 2 midnights from the point of admission due to high intensity of service, high risk for further deterioration and high frequency of surveillance required.*   Family Communication: None at bedside  Disposition Plan: To be determined  Consultants: Psychiatry Vascular Orthopedic  Procedures: None  Antimicrobials: None  DVT  prophylaxis: Apixaban   Objective: Vitals:   05/09/21 1006 05/09/21 1559 05/09/21 2046 05/10/21 0740  BP: 118/80 109/88 106/73 116/71  Pulse: 78 73 75 69  Resp: 16 17 15 17   Temp: 99 F (37.2 C) 97.9 F (36.6 C) 97.8 F (36.6 C) (!) 97.3 F (36.3 C)  TempSrc: Oral Oral  Oral  SpO2: 97% 100% 98% 91%  Weight:      Height:        Intake/Output Summary (Last 24 hours) at 05/10/2021 0931 Last data filed at 05/10/2021 0431 Gross per 24 hour  Intake 252.37 ml  Output 1200 ml  Net -947.63 ml   Filed Weights   04/30/21 0615 04/30/21 1236 05/01/21 0606  Weight: 75 kg 75 kg 74.7 kg   Body mass index is 31.12 kg/m.  Exam:  General: 55 y.o. year-old female well developed well nourished in no acute distress.  Alert and oriented x3.  Obese Cardiovascular: Regular rate and rhythm with no rubs or gallops.  No thyromegaly or JVD noted.   Respiratory: Clear to auscultation with no wheezes or rales. Good inspiratory effort. Abdomen: Soft nontender nondistended with normal bowel sounds x4 quadrants. Musculoskeletal: No lower extremity edema. 2/4 pulses in all 4 extremities. Skin: Ulceration of bilateral lower extremity which is covered with dressing Psychiatry: Mood is appropriate for condition and setting    Data Reviewed: CBC: Recent Labs  Lab 05/06/21 0416 05/06/21 1624 05/07/21 0350 05/08/21 0459 05/09/21 0440 05/10/21 0431  WBC 10.8*  --  14.1* 8.9 8.9 8.5  NEUTROABS 8.8*  --  11.6* 6.2 6.2 6.1  HGB 6.7* 9.1* 8.6* 8.1* 8.0* 8.6*  HCT 23.4* 29.7* 28.5* 26.6* 27.6* 29.1*  MCV 97.5  --  94.7 95.0 95.8 95.7  PLT 259  --  276 263 270 485   Basic Metabolic Panel: Recent Labs  Lab 05/06/21 0416 05/07/21 0350 05/08/21 0459 05/09/21 0440 05/10/21 0431  NA 141 142 143 142 140  K 3.6 3.2* 4.1 3.9 3.9  CL 113* 114* 115* 114* 112*  CO2 22 23 24 22 24   GLUCOSE 175* 174* 107* 98 129*  BUN 9 9 8 8 6   CREATININE 1.08* 1.04* 1.02* 1.09* 0.96  CALCIUM 8.3* 8.3* 8.0* 8.4*  8.5*  MG 1.8 1.7 2.1 1.7 1.9  PHOS 3.1 3.2 3.0 3.2 3.7   GFR: Estimated Creatinine Clearance: 59.8 mL/min (by C-G formula based on SCr of 0.96 mg/dL). Liver Function Tests: Recent Labs  Lab 05/06/21 0416 05/07/21 0350 05/08/21 0459 05/09/21 0440 05/10/21 0431  AST 12* 9* 9* 9* 8*  ALT 9 9 8 9 9   ALKPHOS 70 74 69 76 78  BILITOT 0.7 0.5 0.8 0.6 0.4  PROT 5.0* 5.3* 4.8* 5.4* 5.2*  ALBUMIN 1.4* 1.6* 1.3* 1.5* 1.5*   No results for input(s): LIPASE, AMYLASE in the last 168 hours. No results for input(s): AMMONIA in the last 168 hours. Coagulation Profile: No results for input(s): INR, PROTIME in the last 168 hours. Cardiac Enzymes: No results for input(s): CKTOTAL, CKMB, CKMBINDEX, TROPONINI in the last 168 hours. BNP (last 3 results) No results for input(s): PROBNP in the last 8760 hours. HbA1C: No results for input(s): HGBA1C in the last 72 hours. CBG: Recent Labs  Lab 05/09/21 0543 05/09/21 0819 05/09/21 1633 05/09/21 2045 05/10/21 0531  GLUCAP 119* 131* 142* 142* 137*   Lipid Profile: No results for input(s): CHOL, HDL, LDLCALC, TRIG, CHOLHDL, LDLDIRECT  in the last 72 hours. Thyroid Function Tests: No results for input(s): TSH, T4TOTAL, FREET4, T3FREE, THYROIDAB in the last 72 hours. Anemia Panel: No results for input(s): VITAMINB12, FOLATE, FERRITIN, TIBC, IRON, RETICCTPCT in the last 72 hours. Urine analysis:    Component Value Date/Time   COLORURINE STRAW (A) 04/01/2021 1732   APPEARANCEUR CLEAR 04/01/2021 1732   LABSPEC 1.008 04/01/2021 1732   PHURINE 6.0 04/01/2021 1732   GLUCOSEU 50 (A) 04/01/2021 1732   HGBUR NEGATIVE 04/01/2021 1732   BILIRUBINUR NEGATIVE 04/01/2021 1732   KETONESUR NEGATIVE 04/01/2021 1732   PROTEINUR 100 (A) 04/01/2021 1732   UROBILINOGEN 0.2 09/11/2014 1319   NITRITE NEGATIVE 04/01/2021 1732   LEUKOCYTESUR SMALL (A) 04/01/2021 1732   Sepsis Labs: @LABRCNTIP (procalcitonin:4,lacticidven:4)  ) Recent Results (from the past 240  hour(s))  Culture, Urine     Status: Abnormal   Collection Time: 05/08/21  5:10 AM   Specimen: Urine, Random  Result Value Ref Range Status   Specimen Description URINE, RANDOM  Final   Special Requests   Final    NONE Performed at [facility], [place] [address], [place], [place] [address]    Culture MULTIPLE SPECIES PRESENT, SUGGEST RECOLLECTION (A)  Final   Report Status 05/09/2021 FINAL  Final      Studies: No results found.  Scheduled Meds:  amLODipine  10 mg Oral Daily   apixaban  5 mg Oral BID   aspirin EC  81 mg Oral Daily   benztropine  0.5 mg Oral BID   Chlorhexidine Gluconate Cloth  6 each Topical Daily   FLUoxetine  20 mg Oral Daily   haloperidol  5 mg Oral TID   hydrocortisone   Rectal BID   insulin aspart  0-5 Units Subcutaneous QHS   insulin aspart  0-9 Units Subcutaneous TID WC   insulin aspart  3 Units Subcutaneous TID WC   levETIRAcetam  750 mg Oral BID   metoprolol tartrate  50 mg Oral BID   midazolam  2 mg Intravenous Once   oxybutynin  5 mg Oral QHS   pantoprazole  40 mg Oral Daily   rosuvastatin  10 mg Oral Daily   traZODone  50 mg Oral QHS   umeclidinium bromide  1 puff Inhalation Daily   zinc sulfate  220 mg Oral Daily    Continuous Infusions:  lactated ringers 10 mL/hr at 04/30/21 1255     LOS: 19 days     Cristal Deer, MD [facility]  To reach me or the doctor on call, go to: [email or web address] Password [facility]  05/10/2021, 9:31 AM

## 2021-05-10 NOTE — Progress Notes (Signed)
Premedicated patient in order to do dressing changes.  She is not allowing dressing changes at this time.

## 2021-05-11 ENCOUNTER — Encounter (HOSPITAL_COMMUNITY): Payer: Self-pay | Admitting: Family Medicine

## 2021-05-11 DIAGNOSIS — I4891 Unspecified atrial fibrillation: Secondary | ICD-10-CM | POA: Diagnosis not present

## 2021-05-11 LAB — COMPREHENSIVE METABOLIC PANEL
ALT: 8 U/L (ref 0–44)
AST: 10 U/L — ABNORMAL LOW (ref 15–41)
Albumin: 1.5 g/dL — ABNORMAL LOW (ref 3.5–5.0)
Alkaline Phosphatase: 79 U/L (ref 38–126)
Anion gap: 6 (ref 5–15)
BUN: 6 mg/dL (ref 6–20)
CO2: 24 mmol/L (ref 22–32)
Calcium: 8.4 mg/dL — ABNORMAL LOW (ref 8.9–10.3)
Chloride: 111 mmol/L (ref 98–111)
Creatinine, Ser: 1.03 mg/dL — ABNORMAL HIGH (ref 0.44–1.00)
GFR, Estimated: >60 mL/min (ref >60)
Glucose, Bld: 129 mg/dL — ABNORMAL HIGH (ref 70–99)
Potassium: 3.6 mmol/L (ref 3.5–5.1)
Sodium: 141 mmol/L (ref 135–145)
Total Bilirubin: 0.5 mg/dL (ref 0.3–1.2)
Total Protein: 5.2 g/dL — ABNORMAL LOW (ref 6.5–8.1)

## 2021-05-11 LAB — CBC WITH DIFFERENTIAL/PLATELET
Abs Immature Granulocytes: 0.03 10*3/uL (ref 0.00–0.07)
Basophils Absolute: 0.1 10*3/uL (ref 0.0–0.1)
Basophils Relative: 1 %
Eosinophils Absolute: 0.3 10*3/uL (ref 0.0–0.5)
Eosinophils Relative: 3 %
HCT: 28.6 % — ABNORMAL LOW (ref 36.0–46.0)
Hemoglobin: 8.7 g/dL — ABNORMAL LOW (ref 12.0–15.0)
Immature Granulocytes: 0 %
Lymphocytes Relative: 19 %
Lymphs Abs: 1.7 10*3/uL (ref 0.7–4.0)
MCH: 28.8 pg (ref 26.0–34.0)
MCHC: 30.4 g/dL (ref 30.0–36.0)
MCV: 94.7 fL (ref 80.0–100.0)
Monocytes Absolute: 0.6 10*3/uL (ref 0.1–1.0)
Monocytes Relative: 7 %
Neutro Abs: 6.3 10*3/uL (ref 1.7–7.7)
Neutrophils Relative %: 70 %
Platelets: 285 10*3/uL (ref 150–400)
RBC: 3.02 MIL/uL — ABNORMAL LOW (ref 3.87–5.11)
RDW: 16.8 % — ABNORMAL HIGH (ref 11.5–15.5)
WBC: 9 10*3/uL (ref 4.0–10.5)
nRBC: 0 % (ref 0.0–0.2)

## 2021-05-11 LAB — GLUCOSE, CAPILLARY
Glucose-Capillary: 119 mg/dL — ABNORMAL HIGH (ref 70–99)
Glucose-Capillary: 160 mg/dL — ABNORMAL HIGH (ref 70–99)
Glucose-Capillary: 82 mg/dL (ref 70–99)
Glucose-Capillary: 72 mg/dL (ref 70–99)

## 2021-05-11 LAB — MAGNESIUM: Magnesium: 1.7 mg/dL (ref 1.7–2.4)

## 2021-05-11 LAB — PHOSPHORUS: Phosphorus: 3.7 mg/dL (ref 2.5–4.6)

## 2021-05-11 NOTE — Progress Notes (Signed)
Patient refused dressing change. Educated on importance.

## 2021-05-11 NOTE — Progress Notes (Addendum)
PROGRESS NOTE  [name]:[id number] DOB: 10-15-1966 DOA: 04/21/2021 PCP: Caprice Renshaw, MD  HPI/Recap of past 50 hours:  55 year old female with past medical history significant for atrial fibrillation on Eliquis, seizure disorder, was tobacco abuse, COPD, peripheral vascular disease, hypertension, diabetes mellitus, diastolic CHF, and chronic kidney disease who presented with bilateral leg pain from her nursing facility.  She was found to have sepsis due to bilateral lower extremity leg ulcer with cellulitis and dry gangrene as well as Staphylococcus bacteremia  May 10, 2021: Patient seen and examined at bedside denies any new complaint  May 11, 2021: Patient seen and examined at bedside she denies any new complaints today  Assessment/Plan: Principal Problem:   Sepsis ([place]) Active Problems:   Essential hypertension   INSOMNIA   Medical non-compliance   Poor mobility   Lower extremity weakness   HTN (hypertension), malignant   Hypokalemia   Diabetic neuropathy (HCC)   CKD (chronic kidney disease) stage 3, GFR 30-59 ml/min (HCC)   Severe recurrent major depression without psychotic features (HCC)   COPD (chronic obstructive pulmonary disease) (HCC)   At high risk for falls   PAF (paroxysmal atrial fibrillation) (HCC)   Atrial fibrillation with RVR (HCC)   Seizures (HCC)   Sacral ulcer, limited to breakdown of skin (HCC)   Reflux esophagitis   Multiple duodenal ulcers   Chronic respiratory failure with hypoxia (HCC)   Hypomagnesemia   PVD (peripheral vascular disease) ([place])   Atherosclerosis of native artery of both legs with gangrene ([place])   MDD (major depressive disorder), recurrent episode, moderate (HCC)   Bleeding hemorrhoids  #1 sepsis resolved  2.  Peripheral vascular disease and hypertension Continue amlodipine Vascular surgery consulted  3.  Major depressive disorder recurrent severe with psychosis.  Appreciate psychiatric consult and  recommendation she was started on Prozac yesterday 20 mg, continue Haldol 5 mg 3 times daily p.o. Continue with Cogentin 0.5 mg twice a day. Cymbalta was discontinued  4.  Diabetes mellitus glucose is normal Continue sliding scale  5.  Anemia of chronic disease Continue iron Hemoglobin has been stable Apixaban resumed  6.  Hemorrhoid She had an episode of bleeding Continue topical hydrocortisone  7.  Chronic respiratory failure with hypoxia  8.  Paroxysmal atrial fibrillation. Continue apixaban continue metoprolol Continue monitoring of potassium and magnesium to keep out the appropriate levels  8.  Acute on chronic kidney disease stage IIIb Improved with holding of nephrotoxins and IV fluid Current creatinine is 0.96 we will follow Continue to monitor  9.  Poor oral intake we will start her on Ensure supplement  Code Status: Full  Severity of Illness: The appropriate patient status for this patient is INPATIENT. Inpatient status is judged to be reasonable and necessary in order to provide the required intensity of service to ensure the patient's safety. The patient's presenting symptoms, physical exam findings, and initial radiographic and laboratory data in the context of their chronic comorbidities is felt to place them at high risk for further clinical deterioration. Furthermore, it is not anticipated that the patient will be medically stable for discharge from the hospital within 2 midnights of admission. The following factors support the patient status of inpatient.    * I certify that at the point of admission it is my clinical judgment that the patient will require inpatient hospital care spanning beyond 2 midnights from the point of admission due to high intensity of service, high risk for further deterioration and high frequency  of surveillance required.*   Family Communication: None at bedside  Disposition Plan: To be  determined   Consultants: Psychiatry Vascular Orthopedic  Procedures: None  Antimicrobials: None  DVT prophylaxis: Apixaban   Objective: Vitals:   05/10/21 0740 05/10/21 2021 05/11/21 0750 05/11/21 1709  BP: 116/71 125/84 (!) 130/98 (!) 135/94  Pulse: 69 79 75 71  Resp: 17 16 18 18   Temp: (!) 97.3 F (36.3 C) 98.7 F (37.1 C) 98.8 F (37.1 C) 98.9 F (37.2 C)  TempSrc: Oral Oral Oral   SpO2: 91% 95% 96% 100%  Weight:      Height:        Intake/Output Summary (Last 24 hours) at 05/11/2021 2103 Last data filed at 05/10/2021 2311 Gross per 24 hour  Intake --  Output 600 ml  Net -600 ml    Filed Weights   04/30/21 0615 04/30/21 1236 05/01/21 0606  Weight: 75 kg 75 kg 74.7 kg   Body mass index is 31.12 kg/m.  Exam:  General: 55 y.o. year-old female well developed well nourished in no acute distress.  Alert and oriented x3.  Obese Cardiovascular: Regular rate and rhythm with no rubs or gallops.  No thyromegaly or JVD noted.   Respiratory: Clear to auscultation with no wheezes or rales. Good inspiratory effort. Abdomen: Soft nontender nondistended with normal bowel sounds x4 quadrants. Musculoskeletal: No lower extremity edema. 2/4 pulses in all 4 extremities. Skin: Ulceration of bilateral lower extremity which is covered with dressing Psychiatry: Mood is appropriate for condition and setting    Data Reviewed: CBC: Recent Labs  Lab 05/07/21 0350 05/08/21 0459 05/09/21 0440 05/10/21 0431 05/11/21 0339  WBC 14.1* 8.9 8.9 8.5 9.0  NEUTROABS 11.6* 6.2 6.2 6.1 6.3  HGB 8.6* 8.1* 8.0* 8.6* 8.7*  HCT 28.5* 26.6* 27.6* 29.1* 28.6*  MCV 94.7 95.0 95.8 95.7 94.7  PLT 276 263 270 289 638    Basic Metabolic Panel: Recent Labs  Lab 05/07/21 0350 05/08/21 0459 05/09/21 0440 05/10/21 0431 05/11/21 0339  NA 142 143 142 140 141  K 3.2* 4.1 3.9 3.9 3.6  CL 114* 115* 114* 112* 111  CO2 23 24 22 24 24   GLUCOSE 174* 107* 98 129* 129*  BUN 9 8 8 6 6    CREATININE 1.04* 1.02* 1.09* 0.96 1.03*  CALCIUM 8.3* 8.0* 8.4* 8.5* 8.4*  MG 1.7 2.1 1.7 1.9 1.7  PHOS 3.2 3.0 3.2 3.7 3.7    GFR: Estimated Creatinine Clearance: 55.7 mL/min (A) (by C-G formula based on SCr of 1.03 mg/dL (H)). Liver Function Tests: Recent Labs  Lab 05/07/21 0350 05/08/21 0459 05/09/21 0440 05/10/21 0431 05/11/21 0339  AST 9* 9* 9* 8* 10*  ALT 9 8 9 9 8   ALKPHOS 74 69 76 78 79  BILITOT 0.5 0.8 0.6 0.4 0.5  PROT 5.3* 4.8* 5.4* 5.2* 5.2*  ALBUMIN 1.6* 1.3* 1.5* 1.5* 1.5*    No results for input(s): LIPASE, AMYLASE in the last 168 hours. No results for input(s): AMMONIA in the last 168 hours. Coagulation Profile: No results for input(s): INR, PROTIME in the last 168 hours. Cardiac Enzymes: No results for input(s): CKTOTAL, CKMB, CKMBINDEX, TROPONINI in the last 168 hours. BNP (last 3 results) No results for input(s): PROBNP in the last 8760 hours. HbA1C: No results for input(s): HGBA1C in the last 72 hours. CBG: Recent Labs  Lab 05/10/21 2024 05/11/21 0335 05/11/21 1143 05/11/21 1708 05/11/21 2019  GLUCAP 114* 119* 160* 82 72    Lipid Profile:  No results for input(s): CHOL, HDL, LDLCALC, TRIG, CHOLHDL, LDLDIRECT in the last 72 hours. Thyroid Function Tests: No results for input(s): TSH, T4TOTAL, FREET4, T3FREE, THYROIDAB in the last 72 hours. Anemia Panel: No results for input(s): VITAMINB12, FOLATE, FERRITIN, TIBC, IRON, RETICCTPCT in the last 72 hours. Urine analysis:    Component Value Date/Time   COLORURINE STRAW (A) 04/01/2021 1732   APPEARANCEUR CLEAR 04/01/2021 1732   LABSPEC 1.008 04/01/2021 1732   PHURINE 6.0 04/01/2021 1732   GLUCOSEU 50 (A) 04/01/2021 1732   HGBUR NEGATIVE 04/01/2021 1732   BILIRUBINUR NEGATIVE 04/01/2021 1732   KETONESUR NEGATIVE 04/01/2021 1732   PROTEINUR 100 (A) 04/01/2021 1732   UROBILINOGEN 0.2 09/11/2014 1319   NITRITE NEGATIVE 04/01/2021 1732   LEUKOCYTESUR SMALL (A) 04/01/2021 1732   Sepsis  Labs: @LABRCNTIP (procalcitonin:4,lacticidven:4)  ) Recent Results (from the past 240 hour(s))  Culture, Urine     Status: Abnormal   Collection Time: 05/08/21  5:10 AM   Specimen: Urine, Random  Result Value Ref Range Status   Specimen Description URINE, RANDOM  Final   Special Requests   Final    NONE Performed at [facility], [place] [address] [place], [place] [address]    Culture MULTIPLE SPECIES PRESENT, SUGGEST RECOLLECTION (A)  Final   Report Status 05/09/2021 FINAL  Final      Studies: No results found.  Scheduled Meds:  amLODipine  10 mg Oral Daily   apixaban  5 mg Oral BID   aspirin EC  81 mg Oral Daily   benztropine  0.5 mg Oral BID   Chlorhexidine Gluconate Cloth  6 each Topical Daily   FLUoxetine  20 mg Oral Daily   haloperidol  5 mg Oral TID   hydrocortisone   Rectal BID   insulin aspart  0-5 Units Subcutaneous QHS   insulin aspart  0-9 Units Subcutaneous TID WC   insulin aspart  3 Units Subcutaneous TID WC   levETIRAcetam  750 mg Oral BID   metoprolol tartrate  50 mg Oral BID   midazolam  2 mg Intravenous Once   oxybutynin  5 mg Oral QHS   pantoprazole  40 mg Oral Daily   rosuvastatin  10 mg Oral Daily   traZODone  50 mg Oral QHS   umeclidinium bromide  1 puff Inhalation Daily   zinc sulfate  220 mg Oral Daily    Continuous Infusions:  lactated ringers 10 mL/hr at 04/30/21 1255     LOS: 20 days     Cristal Deer, MD [facility]  To reach me or the doctor on call, go to: [email or web address]  05/11/2021, 9:03 PM

## 2021-05-11 NOTE — Plan of Care (Signed)
  Problem: Coping: Goal: Level of anxiety will decrease Outcome: Progressing   Problem: Pain Managment: Goal: General experience of comfort will improve Outcome: Progressing   Problem: Safety: Goal: Ability to remain free from injury will improve Outcome: Progressing   Problem: Skin Integrity: Goal: Risk for impaired skin integrity will decrease Outcome: Progressing   Problem: Safety: Goal: Non-violent Restraint(s) Outcome: Progressing   

## 2021-05-12 ENCOUNTER — Ambulatory Visit: Payer: Medicare Other | Admitting: Orthopedic Surgery

## 2021-05-12 DIAGNOSIS — A419 Sepsis, unspecified organism: Secondary | ICD-10-CM | POA: Diagnosis not present

## 2021-05-12 DIAGNOSIS — I70263 Atherosclerosis of native arteries of extremities with gangrene, bilateral legs: Secondary | ICD-10-CM | POA: Diagnosis not present

## 2021-05-12 DIAGNOSIS — I4891 Unspecified atrial fibrillation: Secondary | ICD-10-CM | POA: Diagnosis not present

## 2021-05-12 DIAGNOSIS — Z9181 History of falling: Secondary | ICD-10-CM | POA: Diagnosis not present

## 2021-05-12 DIAGNOSIS — Z9119 Patient's noncompliance with other medical treatment and regimen: Secondary | ICD-10-CM | POA: Diagnosis not present

## 2021-05-12 DIAGNOSIS — F332 Major depressive disorder, recurrent severe without psychotic features: Secondary | ICD-10-CM | POA: Diagnosis not present

## 2021-05-12 DIAGNOSIS — F331 Major depressive disorder, recurrent, moderate: Secondary | ICD-10-CM | POA: Diagnosis not present

## 2021-05-12 LAB — CBC WITH DIFFERENTIAL/PLATELET
Abs Immature Granulocytes: 0.06 10*3/uL (ref 0.00–0.07)
Basophils Absolute: 0.1 10*3/uL (ref 0.0–0.1)
Basophils Relative: 1 %
Eosinophils Absolute: 0.3 10*3/uL (ref 0.0–0.5)
Eosinophils Relative: 2 %
HCT: 30 % — ABNORMAL LOW (ref 36.0–46.0)
Hemoglobin: 9.2 g/dL — ABNORMAL LOW (ref 12.0–15.0)
Immature Granulocytes: 1 %
Lymphocytes Relative: 13 %
Lymphs Abs: 1.4 10*3/uL (ref 0.7–4.0)
MCH: 28.7 pg (ref 26.0–34.0)
MCHC: 30.7 g/dL (ref 30.0–36.0)
MCV: 93.5 fL (ref 80.0–100.0)
Monocytes Absolute: 0.6 10*3/uL (ref 0.1–1.0)
Monocytes Relative: 5 %
Neutro Abs: 8.7 10*3/uL — ABNORMAL HIGH (ref 1.7–7.7)
Neutrophils Relative %: 78 %
Platelets: 328 10*3/uL (ref 150–400)
RBC: 3.21 MIL/uL — ABNORMAL LOW (ref 3.87–5.11)
RDW: 16.7 % — ABNORMAL HIGH (ref 11.5–15.5)
WBC: 11.1 10*3/uL — ABNORMAL HIGH (ref 4.0–10.5)
nRBC: 0.2 % (ref 0.0–0.2)

## 2021-05-12 LAB — GLUCOSE, CAPILLARY
Glucose-Capillary: 92 mg/dL (ref 70–99)
Glucose-Capillary: 86 mg/dL (ref 70–99)
Glucose-Capillary: 90 mg/dL (ref 70–99)
Glucose-Capillary: 129 mg/dL — ABNORMAL HIGH (ref 70–99)
Glucose-Capillary: 113 mg/dL — ABNORMAL HIGH (ref 70–99)

## 2021-05-12 LAB — PHOSPHORUS: Phosphorus: 3.7 mg/dL (ref 2.5–4.6)

## 2021-05-12 LAB — MAGNESIUM: Magnesium: 1.6 mg/dL — ABNORMAL LOW (ref 1.7–2.4)

## 2021-05-12 MED ORDER — MAGNESIUM SULFATE 2 GM/50ML IV SOLN
2.0000 g | Freq: Once | INTRAVENOUS | Status: AC
  Administered 2021-05-12: 2 g via INTRAVENOUS
  Filled 2021-05-12: qty 50

## 2021-05-12 NOTE — Consult Note (Signed)
[facility] Face-to-Face Psychiatry Consult   Reason for Consult:  Mood dysregulation Referring Physician:  Nolberto Hanlon, MD Patient Identification: [name] MRN:  [id number] Principal Diagnosis: Sepsis [facility]) Diagnosis:  Principal Problem:   Sepsis ([place]) Active Problems:   Essential hypertension   INSOMNIA   Medical non-compliance   Poor mobility   Lower extremity weakness   HTN (hypertension), malignant   Hypokalemia   Diabetic neuropathy (HCC)   CKD (chronic kidney disease) stage 3, GFR 30-59 ml/min (HCC)   Severe recurrent major depression without psychotic features ([place])   COPD (chronic obstructive pulmonary disease) (HCC)   At high risk for falls   PAF (paroxysmal atrial fibrillation) (HCC)   Atrial fibrillation with RVR (HCC)   Seizures (HCC)   Sacral ulcer, limited to breakdown of skin (HCC)   Reflux esophagitis   Multiple duodenal ulcers   Chronic respiratory failure with hypoxia (HCC)   Hypomagnesemia   PVD (peripheral vascular disease) (HCC)   Atherosclerosis of native artery of both legs with gangrene (HCC)   MDD (major depressive disorder), recurrent episode, moderate (HCC)   Bleeding hemorrhoids   Total Time spent with patient: 30 minutes  Subjective:   [name] is a 55 y.o. female patient admitted with  bilateral leg pain w/ PMH of depression, tobacco use disorder, severe PVD, paroxysmal aticoagulant, HLD, HTN, uncontrolled T2DM, CHF, CKD. Marland Kitchen  HPI:  On assessment today patient reports she "pissed off." Patient does not not give any other reason than "I want to sleep." Patient does not wish to have lights turned on or blinds open (despite it being mid morning) because "I don't want to."  RN turned lights on and opened blinds as she came to give medications. Patient reports she is does not have to do anything to "get better." Patient reports he does not eat because, " I don't want to and I don't have to do what I don't want." Patient says she will get up and  walk out to go home. Provider explains that she has to eat to help with healing but patient reports she will does not wish to. Patient continues to refuse to allow RN's to clean wounds. Patient remains in bed all day. At times she is seen on her phone. Patient continues to forget that her son is no longer in the position of power. Patient does deny SI, HI, and AVH. Patient is compliant with her medications.   Past Psychiatric History: Reports OP psych 4-5 visits total. Also report being dx with "Clinical depression" Recalls being on haldol for depression. Recalls taking Paxil and Prozac in the past.   Of note patient was not taking her Keppra prior to hospitalization despite having a seizure in 07/2020.   Risk to Self:  NO Risk to Others:  NO Prior Inpatient Therapy:  NO Prior Outpatient Therapy:  YES  Past Medical History:  Past Medical History:  Diagnosis Date   Alcohol use    Ankle fracture, lateral malleolus, closed 2013   Anxiety    Aortic atherosclerosis ([place]) 03/10/2021   Breast mass, left 2013   CHF (congestive heart failure) ([place])    a. EF 20-25% by echo in 05/2016 with cath showing normal cors b. EF 50-55% in 07/2020 c. 01/2021: EF at 55-60% with moderate LVH   Chronic anemia    CKD (chronic kidney disease), stage III (HCC)    Cocaine abuse (HCC)    COPD (chronic obstructive pulmonary disease) (HCC)    Diabetes mellitus, type  2 ([place])    Diabetic Charcot foot ([place])    Essential hypertension    History of cardiomyopathy    History of GI bleed 2011   Hyperlipidemia    Noncompliance    Obesity    PAF (paroxysmal atrial fibrillation) (HCC)    Panic attacks    PAT (paroxysmal atrial tachycardia) (HCC)    Previously on Amiodarone   Seizures ([place]) 10/01/2020   Sleep apnea    Not on CPAP   Stroke ([place]) 2018   Tobacco abuse    Urinary incontinence     Past Surgical History:  Procedure Laterality Date   ANGIOPLASTY ILLIAC ARTERY Right 04/23/2021   Procedure: ANGIOPLASTY AND  STENT SUPERFICIAL FEMORAL ARTERY;  Surgeon: Cherre Robins, MD;  Location: [place];  Service: Vascular;  Laterality: Right;   AORTOGRAM  04/23/2021   Procedure: AORTOGRAM;  Surgeon: Cherre Robins, MD;  Location: [place];  Service: Vascular;;   AORTOGRAM Left 04/30/2021   Procedure: left lower extremity angiogram with second order cannulation;  Surgeon: Cherre Robins, MD;  Location: [place];  Service: Vascular;  Laterality: Left;   BIOPSY  11/12/2020   Procedure: BIOPSY;  Surgeon: Harvel Quale, MD;  [facility];  Service: Gastroenterology;;   BREAST BIOPSY     CARDIAC CATHETERIZATION N/A 07/28/2016   Procedure: Left Heart Cath and Coronary Angiography;  Surgeon: Jettie Booze, MD;  Location: [place] [facility];  Service: Cardiovascular;  Laterality: N/A;   COLONOSCOPY N/A 05/10/2019   Procedure: COLONOSCOPY;  Surgeon: Danie Binder, MD;  [facility];  Service: Endoscopy;  Laterality: N/A;  Phenergan 12.5 mg IV in pre-op   DILATION AND CURETTAGE OF UTERUS     ESOPHAGOGASTRODUODENOSCOPY (EGD) WITH PROPOFOL N/A 11/12/2020   Procedure: ESOPHAGOGASTRODUODENOSCOPY (EGD) WITH PROPOFOL;  Surgeon: Harvel Quale, MD;  [facility];  Service: Gastroenterology;  Laterality: N/A;   I & D EXTREMITY Bilateral 09/22/2017   Procedure: BILATERAL DEBRIDEMENT LEG/FOOT ULCERS, APPLY VERAFLO WOUND VAC;  Surgeon: Newt Minion, MD;  Location: [place];  Service: Orthopedics;  Laterality: Bilateral;   I & D EXTREMITY Right 10/11/2018   Procedure: IRRIGATION AND DEBRIDEMENT RIGHT HAND;  Surgeon: [name], MD;  Location: [place];  Service: Orthopedics;  Laterality: Right;   I & D EXTREMITY Right 10/13/2018   Procedure: REPEAT IRRIGATION AND DEBRIDEMENT RIGHT HAND;  Surgeon: [name], MD;  Location: [place];  Service: Orthopedics;  Laterality: Right;   I & D EXTREMITY Right 11/22/2018   Procedure: IRRIGATION AND DEBRIDEMENT AND PINNING RIGHT HAND;   Surgeon: [name], MD;  Location: [place];  Service: Orthopedics;  Laterality: Right;   IR RADIOLOGIST EVAL & MGMT  07/05/2018   LOWER EXTREMITY ANGIOGRAM Bilateral 04/23/2021   Procedure: RIGHT  AND LEFT LOWER EXTREMITY ANGIOGRAM;  Surgeon: Cherre Robins, MD;  Location: [place];  Service: Vascular;  Laterality: Bilateral;   POLYPECTOMY  05/10/2019   Procedure: POLYPECTOMY;  Surgeon: Danie Binder, MD;  [facility];  Service: Endoscopy;;   SKIN SPLIT GRAFT Bilateral 09/28/2017   Procedure: BILATERAL SPLIT THICKNESS SKIN GRAFT LEGS/FEET AND APPLY VAC;  Surgeon: Newt Minion, MD;  Location: [place];  Service: Orthopedics;  Laterality: Bilateral;   SKIN SPLIT GRAFT Right 11/22/2018   Procedure: SKIN GRAFT SPLIT THICKNESS;  Surgeon: [name], MD;  Location: [place];  Service: Orthopedics;  Laterality: Right;   Family History:  Family History  Problem Relation  Age of Onset   Hypertension Mother    Heart attack Mother    Hypertension Father        CABG    Hypertension Sister    Hypertension Brother    Hypertension Sister    Cancer Sister        breast    Arthritis Other    Cancer Other    Diabetes Other    Asthma Other    Hypertension Daughter    Hypertension Son    Family Psychiatric  History:  Social History:  Social History   Substance and Sexual Activity  Alcohol Use Not Currently   Alcohol/week: 0.0 standard drinks   Comment: Quit in 2017     Social History   Substance and Sexual Activity  Drug Use Not Currently   Frequency: 3.0 times per week   Types: Marijuana, Cocaine   Comment: none since August 2018    Social History   Socioeconomic History   Marital status: Widowed    Spouse name: Not on file   Number of children: 2   Years of education: 9   Highest education level: 9th grade  Occupational History   Occupation: Disabled  Tobacco Use   Smoking status: Some Days    Packs/day: 0.25    Years: 20.00    Pack years: 5.00    Types:  Cigarettes    Last attempt to quit: 06/03/2017    Years since quitting: 3.9   Smokeless tobacco: Never  Vaping Use   Vaping Use: Never used  Substance and Sexual Activity   Alcohol use: Not Currently    Alcohol/week: 0.0 standard drinks    Comment: Quit in 2017   Drug use: Not Currently    Frequency: 3.0 times per week    Types: Marijuana, Cocaine    Comment: none since August 2018   Sexual activity: Not on file  Other Topics Concern   Not on file  Social History Narrative   Right handed   Drinks 1-2 cups caffeine daily   Social Determinants of Health   Financial Resource Strain: Medium Risk   Difficulty of Paying Living Expenses: Somewhat hard  Food Insecurity: Food Insecurity Present   Worried About Charity fundraiser in the Last Year: Sometimes true   Ran Out of Food in the Last Year: Sometimes true  Transportation Needs: No Transportation Needs   Lack of Transportation (Medical): No   Lack of Transportation (Non-Medical): No  Physical Activity: Insufficiently Active   Days of Exercise per Week: 7 days   Minutes of Exercise per Session: 20 min  Stress: Stress Concern Present   Feeling of Stress : To some extent  Social Connections: Moderately Isolated   Frequency of Communication with Friends and Family: More than three times a week   Frequency of Social Gatherings with Friends and Family: More than three times a week   Attends Religious Services: More than 4 times per year   Active Member of [employer] or Organizations: No   Attends Archivist Meetings: Never   Marital Status: Widowed   Additional Social History:    Allergies:  No Known Allergies  Labs:  Results for orders placed or performed during the hospital encounter of 04/21/21 (from the past 48 hour(s))  Glucose, capillary     Status: None   Collection Time: 05/10/21  4:55 PM  Result Value Ref Range   Glucose-Capillary 78 70 - 99 mg/dL    Comment: Glucose reference range applies only  to samples  taken after fasting for at least 8 hours.  Glucose, capillary     Status: Abnormal   Collection Time: 05/10/21  8:24 PM  Result Value Ref Range   Glucose-Capillary 114 (H) 70 - 99 mg/dL    Comment: Glucose reference range applies only to samples taken after fasting for at least 8 hours.  Glucose, capillary     Status: Abnormal   Collection Time: 05/11/21  3:35 AM  Result Value Ref Range   Glucose-Capillary 119 (H) 70 - 99 mg/dL    Comment: Glucose reference range applies only to samples taken after fasting for at least 8 hours.  CBC with Differential/Platelet     Status: Abnormal   Collection Time: 05/11/21  3:39 AM  Result Value Ref Range   WBC 9.0 4.0 - 10.5 K/uL   RBC 3.02 (L) 3.87 - 5.11 MIL/uL   Hemoglobin 8.7 (L) 12.0 - 15.0 g/dL   HCT 28.6 (L) 36.0 - 46.0 %   MCV 94.7 80.0 - 100.0 fL   MCH 28.8 26.0 - 34.0 pg   MCHC 30.4 30.0 - 36.0 g/dL   RDW 16.8 (H) 11.5 - 15.5 %   Platelets 285 150 - 400 K/uL   nRBC 0.0 0.0 - 0.2 %   Neutrophils Relative % 70 %   Neutro Abs 6.3 1.7 - 7.7 K/uL   Lymphocytes Relative 19 %   Lymphs Abs 1.7 0.7 - 4.0 K/uL   Monocytes Relative 7 %   Monocytes Absolute 0.6 0.1 - 1.0 K/uL   Eosinophils Relative 3 %   Eosinophils Absolute 0.3 0.0 - 0.5 K/uL   Basophils Relative 1 %   Basophils Absolute 0.1 0.0 - 0.1 K/uL   Immature Granulocytes 0 %   Abs Immature Granulocytes 0.03 0.00 - 0.07 K/uL    Comment: Performed at [facility], [address], [place], [place] [address]  Comprehensive metabolic panel     Status: Abnormal   Collection Time: 05/11/21  3:39 AM  Result Value Ref Range   Sodium 141 135 - 145 mmol/L   Potassium 3.6 3.5 - 5.1 mmol/L   Chloride 111 98 - 111 mmol/L   CO2 24 22 - 32 mmol/L   Glucose, Bld 129 (H) 70 - 99 mg/dL    Comment: Glucose reference range applies only to samples taken after fasting for at least 8 hours.   BUN 6 6 - 20 mg/dL   Creatinine, Ser 1.03 (H) 0.44 - 1.00 mg/dL   Calcium 8.4 (L) 8.9 - 10.3 mg/dL    Total Protein 5.2 (L) 6.5 - 8.1 g/dL   Albumin 1.5 (L) 3.5 - 5.0 g/dL   AST 10 (L) 15 - 41 U/L   ALT 8 0 - 44 U/L   Alkaline Phosphatase 79 38 - 126 U/L   Total Bilirubin 0.5 0.3 - 1.2 mg/dL   GFR, Estimated >60 >60 mL/min    Comment: (NOTE) Calculated using the CKD-EPI Creatinine Equation (2021)    Anion gap 6 5 - 15    Comment: Performed at [facility], [place] [address], [place], [place] [address]  Magnesium     Status: None   Collection Time: 05/11/21  3:39 AM  Result Value Ref Range   Magnesium 1.7 1.7 - 2.4 mg/dL    Comment: Performed at [place] [address], [place], [place] [address]  Phosphorus     Status: None   Collection Time: 05/11/21  3:39 AM  Result Value Ref Range   Phosphorus 3.7 2.5 - 4.6 mg/dL    Comment: Performed at [facility], [place] [address], [place], [place] [address]  Glucose, capillary     Status: Abnormal   Collection Time: 05/11/21 11:43 AM  Result Value Ref Range   Glucose-Capillary 160 (H) 70 - 99 mg/dL    Comment: Glucose reference range applies only to samples taken after fasting for at least 8 hours.  Glucose, capillary     Status: None   Collection Time: 05/11/21  5:08 PM  Result Value Ref Range   Glucose-Capillary 82 70 - 99 mg/dL    Comment: Glucose reference range applies only to samples taken after fasting for at least 8 hours.  Glucose, capillary     Status: None   Collection Time: 05/11/21  8:19 PM  Result Value Ref Range   Glucose-Capillary 72 70 - 99 mg/dL    Comment: Glucose reference range applies only to samples taken after fasting for at least 8 hours.  Glucose, capillary     Status: None   Collection Time: 05/12/21  3:11 AM  Result Value Ref Range   Glucose-Capillary 92 70 - 99 mg/dL    Comment: Glucose reference range applies only to samples taken after fasting for at least 8 hours.  CBC with Differential/Platelet     Status: Abnormal   Collection Time: 05/12/21  3:34 AM  Result Value Ref Range   WBC  11.1 (H) 4.0 - 10.5 K/uL   RBC 3.21 (L) 3.87 - 5.11 MIL/uL   Hemoglobin 9.2 (L) 12.0 - 15.0 g/dL   HCT 30.0 (L) 36.0 - 46.0 %   MCV 93.5 80.0 - 100.0 fL   MCH 28.7 26.0 - 34.0 pg   MCHC 30.7 30.0 - 36.0 g/dL   RDW 16.7 (H) 11.5 - 15.5 %   Platelets 328 150 - 400 K/uL   nRBC 0.2 0.0 - 0.2 %   Neutrophils Relative % 78 %   Neutro Abs 8.7 (H) 1.7 - 7.7 K/uL   Lymphocytes Relative 13 %   Lymphs Abs 1.4 0.7 - 4.0 K/uL   Monocytes Relative 5 %   Monocytes Absolute 0.6 0.1 - 1.0 K/uL   Eosinophils Relative 2 %   Eosinophils Absolute 0.3 0.0 - 0.5 K/uL   Basophils Relative 1 %   Basophils Absolute 0.1 0.0 - 0.1 K/uL   Immature Granulocytes 1 %   Abs Immature Granulocytes 0.06 0.00 - 0.07 K/uL    Comment: Performed at [facility], [address] [place], [place] [address]  Magnesium     Status: Abnormal   Collection Time: 05/12/21  3:34 AM  Result Value Ref Range   Magnesium 1.6 (L) 1.7 - 2.4 mg/dL    Comment: Performed at [facility] [address] [place], [place] [address]  Phosphorus     Status: None   Collection Time: 05/12/21  3:34 AM  Result Value Ref Range   Phosphorus 3.7 2.5 - 4.6 mg/dL    Comment: Performed at [facility] [address], [place], [place] [address]  Glucose, capillary     Status: None   Collection Time: 05/12/21  7:12 AM  Result Value Ref Range   Glucose-Capillary 86 70 - 99 mg/dL    Comment: Glucose reference range applies only to samples taken after fasting for at least 8 hours.  Glucose, capillary     Status: None   Collection Time: 05/12/21  11:21 AM  Result Value Ref Range   Glucose-Capillary 90 70 - 99 mg/dL    Comment: Glucose reference range applies only to samples taken after fasting for at least 8 hours.   *Note: Due to a large number of results and/or encounters for the requested time period, some results have not been displayed. A complete set of results can be found in Results Review.    Current  Facility-Administered Medications  Medication Dose Route Frequency Provider Last Rate Last Admin   acetaminophen (TYLENOL) tablet 650 mg  650 mg Oral Q6H PRN Johnson, Clanford L, MD   650 mg at 05/10/21 1005   Or   acetaminophen (TYLENOL) suppository 650 mg  650 mg Rectal Q6H PRN Johnson, Clanford L, MD       amLODipine (NORVASC) tablet 10 mg  10 mg Oral Daily Johnson, Clanford L, MD   10 mg at 05/12/21 0942   apixaban (ELIQUIS) tablet 5 mg  5 mg Oral BID Edwin Dada, MD   5 mg at 05/12/21 4540   aspirin EC tablet 81 mg  81 mg Oral Daily Dagoberto Ligas, PA-C   81 mg at 05/12/21 9811   benztropine (COGENTIN) tablet 0.5 mg  0.5 mg Oral BID Damita Dunnings B, MD   0.5 mg at 05/12/21 0941   Chlorhexidine Gluconate Cloth 2 % PADS 6 each  6 each Topical Daily Allie Bossier, MD   6 each at 05/12/21 0942   FLUoxetine (PROZAC) capsule 20 mg  20 mg Oral Daily Damita Dunnings B, MD   20 mg at 05/12/21 0942   haloperidol (HALDOL) tablet 5 mg  5 mg Oral TID Damita Dunnings B, MD   5 mg at 05/12/21 0941   hydrocortisone (ANUSOL-HC) 2.5 % rectal cream   Rectal BID Allie Bossier, MD   Given at 05/11/21 2050   insulin aspart (novoLOG) injection 0-5 Units  0-5 Units Subcutaneous QHS Johnson, Clanford L, MD       insulin aspart (novoLOG) injection 0-9 Units  0-9 Units Subcutaneous TID WC Johnson, Clanford L, MD   2 Units at 05/11/21 1152   insulin aspart (novoLOG) injection 3 Units  3 Units Subcutaneous TID WC Johnson, Clanford L, MD   3 Units at 05/11/21 1151   lactated ringers infusion   Intravenous Continuous Annye Asa, MD 10 mL/hr at 04/30/21 1255 Restarted at 04/30/21 1430   levETIRAcetam (KEPPRA) tablet 750 mg  750 mg Oral BID Johnson, Clanford L, MD   750 mg at 05/12/21 0941   lip balm (CARMEX) ointment   Topical PRN Cristal Ford, DO       LORazepam (ATIVAN) injection 2 mg  2 mg Intravenous Q6H PRN Allie Bossier, MD   2 mg at 05/11/21 2051   metoprolol tartrate (LOPRESSOR) tablet 50  mg  50 mg Oral BID Wynetta Emery, Clanford L, MD   50 mg at 05/12/21 0942   midazolam (VERSED) injection 2 mg  2 mg Intravenous Once Allie Bossier, MD       morphine 2 MG/ML injection 2 mg  2 mg Intravenous Q4H PRN Cristal Ford, DO   2 mg at 05/10/21 2247   ondansetron (ZOFRAN) tablet 4 mg  4 mg Oral Q6H PRN Johnson, Clanford L, MD       Or   ondansetron (ZOFRAN) injection 4 mg  4 mg Intravenous Q6H PRN Johnson, Clanford L, MD       oxybutynin (DITROPAN-XL) 24 hr tablet 5 mg  5 mg  Oral QHS Johnson, Clanford L, MD   5 mg at 05/11/21 2051   oxyCODONE (Oxy IR/ROXICODONE) immediate release tablet 5-10 mg  5-10 mg Oral Q6H PRN Cristal Ford, DO   10 mg at 05/11/21 1143   pantoprazole (PROTONIX) EC tablet 40 mg  40 mg Oral Daily Johnson, Clanford L, MD   40 mg at 05/12/21 9735   rosuvastatin (CRESTOR) tablet 10 mg  10 mg Oral Daily Johnson, Clanford L, MD   10 mg at 05/12/21 3299   sodium chloride flush (NS) 0.9 % injection 10-40 mL  10-40 mL Intracatheter PRN Allie Bossier, MD   10 mL at 05/06/21 2426   traZODone (DESYREL) tablet 50 mg  50 mg Oral QHS Johnson, Clanford L, MD   50 mg at 05/11/21 2051   umeclidinium bromide (INCRUSE ELLIPTA) 62.5 MCG/INH 1 puff  1 puff Inhalation Daily Danford, Suann Larry, MD       zinc sulfate capsule 220 mg  220 mg Oral Daily Johnson, Clanford L, MD   220 mg at 05/12/21 8341    Musculoskeletal: Strength & Muscle Tone: decreased Gait & Station:  remains in bed Patient leans: N/A            Psychiatric Specialty Exam:  Presentation  General Appearance: -- (keeps covers either up to her chin or over her head)  Eye Contact:None  Speech:Slurred  Speech Volume:Increased  Handedness: No data recorded  Mood and Affect  Mood:Irritable  Affect:Congruent   Thought Process  Thought Processes:Linear  Descriptions of Associations:Circumstantial  Orientation:Partial  Thought Content:Illogical  History of Schizophrenia/Schizoaffective  disorder:No data recorded Duration of Psychotic Symptoms:No data recorded Hallucinations:Hallucinations: None  Ideas of Reference:None  Suicidal Thoughts:Suicidal Thoughts: No  Homicidal Thoughts:Homicidal Thoughts: No   Sensorium  Memory:Immediate Poor; Recent Poor; Remote Poor  Judgment:Poor  Insight:None   Executive Functions  Concentration:Poor  Attention Span:Poor  Recall:Poor  Fund of Knowledge:Poor  Language:Fair   Psychomotor Activity  Psychomotor Activity:Psychomotor Activity: Decreased   Assets  Assets:Social Support; Resilience   Sleep  Sleep:Sleep: Fair   Physical Exam: Physical Exam HENT:     Head: Normocephalic and atraumatic.  Eyes:     Extraocular Movements: Extraocular movements intact.     Conjunctiva/sclera: Conjunctivae normal.  Cardiovascular:     Rate and Rhythm: Normal rate.  Pulmonary:     Effort: Pulmonary effort is normal.     Breath sounds: Normal breath sounds.  Abdominal:     General: Abdomen is flat.  Skin:    General: Skin is warm and dry.  Neurological:     Mental Status: She is alert.     Comments: Oriented to person, place, and situation  not to year    Review of Systems  Constitutional:  Negative for chills and fever.  HENT:  Negative for hearing loss.   Eyes:  Negative for blurred vision.  Respiratory:  Negative for cough and wheezing.   Cardiovascular:  Negative for chest pain.  Gastrointestinal:  Negative for abdominal pain.  Neurological:  Negative for dizziness.  Blood pressure (!) 140/100, pulse 84, temperature 99 F (37.2 C), temperature source Oral, resp. rate 15, height 5\' 1"  (1.549 m), weight 74.7 kg, last menstrual period 05/19/2016, SpO2 93 %. Body mass index is 31.12 kg/m.  Treatment Plan Summary: Daily contact with patient to assess and evaluate symptoms and progress in treatment MDD,recurrent, severe, w/ psychosis Traits of Schizoid personality Patient displays schizoid personality  traits in the fact that she prefers to be alone,  detaches herself, and per family there is also a hx of distant social relationships. This appears to be contributing to patient's attitudes towards her health and treatment.  -  Patient appears very comfortable with refusing treatments and at this time no medication will fix this at it appears based in her personality. Patient is not able to reason beyond she wishes to be left alone, but cannot actually reason why treatments are reccommended and the consequences of refusal. Patient is no longer as anhedonic as at beginning of hospitalization and does not have AVH. Patient also appears more alert and has more affect when talking, suggesting that her Prozac is starting to help, but full benefit will not be seen for 4-6 weeks.  - Recommend primary team reevaluate for discharge, psychiatrically patient appears at baseline - Continue Prozac 20mg  - Continue Haldol 5mg  PO TID - Continue cogentin 0.5mg  BID - Recommend Ensure if PO intake remains an issue  Capacity Patient does not have the capacity to make her own medical decisions as she has no ability to reason. Suggest that her daughter be brought in to make medical decisions and help with dispo planning.  Disposition: Patient does not meet criteria for psychiatric inpatient admission.  PGY-2 Freida Busman, MD 05/12/2021 3:16 PM

## 2021-05-12 NOTE — Progress Notes (Signed)
PROGRESS NOTE  [name] HYQ:[id number] DOB: 1965/11/06 DOA: 04/21/2021 PCP: Caprice Renshaw, MD   LOS: 21 days   Brief Narrative / Interim history: 55 year old female with past medical history significant for atrial fibrillation on Eliquis, seizure disorder, was tobacco abuse, COPD, peripheral vascular disease, hypertension, diabetes mellitus, diastolic CHF, and chronic kidney disease who presented with bilateral leg pain from her nursing facility.  She was found to have sepsis due to bilateral lower extremity leg ulcer with cellulitis and dry gangrene as well as Staphylococcus bacteremia  Subjective / 24h Interval events: No complaints this morning, doing well. No fevers / chills. No chest pain, no shortness of breath   Assessment & Plan: Principal Problem Sepsis due to staph capitis bacteremia  -this was likely in the setting of multiple LE ischemic ulcers. ID consulted and followed patient, she was maintained on antibiotics which have finished on 7/14 -remains afebrile, monitor off antibiotics  Active Problems Severe PVD -vascular and orthopedic surgery consulted, she is now s/p right superficial femoral / popliteal artery stenting on 6/29 by Dr Stanford Breed and left lower extremity angiogram with second order cannulation  7/6 -intermittently refusing care  DM2 - continue insulin  CBG (last 3)  Recent Labs    05/11/21 2019 05/12/21 0311 05/12/21 0712  GLUCAP 72 92 86   Anemia of chronic disease -Due to infection, chronic respiratory failure. Hemoglobin has been stable -continue apixaban   Hemorrhoids -Topical hydrocortisone  Chronic respiratory failure with hypoxia COPD -No wheezing  Paroxysmal atrial fibrillation -Rate controlled, continue Eliquis, metoprolol  Mood disorder -Continue Haldol, benztropine, trazodone, fluoxetine  AKI on CKD stage IIIb -Creatinine up to 1.7 in the hospital, improved to baseline 1.1 with holding nephrotoxins and IV fluids   History of  seizures -Continue Keppra   Perineum stage I pressure injury, present on arrival Coccyx right, stage II pressure injury, present on admission   Incontinence -Continue oxybutynin   GERD -Continue pantoprazole   Obesity -BMI 31  Scheduled Meds:  amLODipine  10 mg Oral Daily   apixaban  5 mg Oral BID   aspirin EC  81 mg Oral Daily   benztropine  0.5 mg Oral BID   Chlorhexidine Gluconate Cloth  6 each Topical Daily   FLUoxetine  20 mg Oral Daily   haloperidol  5 mg Oral TID   hydrocortisone   Rectal BID   insulin aspart  0-5 Units Subcutaneous QHS   insulin aspart  0-9 Units Subcutaneous TID WC   insulin aspart  3 Units Subcutaneous TID WC   levETIRAcetam  750 mg Oral BID   metoprolol tartrate  50 mg Oral BID   midazolam  2 mg Intravenous Once   oxybutynin  5 mg Oral QHS   pantoprazole  40 mg Oral Daily   rosuvastatin  10 mg Oral Daily   traZODone  50 mg Oral QHS   umeclidinium bromide  1 puff Inhalation Daily   zinc sulfate  220 mg Oral Daily   Continuous Infusions:  lactated ringers 10 mL/hr at 04/30/21 1255   magnesium sulfate bolus IVPB     PRN Meds:.acetaminophen **OR** acetaminophen, lip balm, LORazepam, morphine injection, ondansetron **OR** ondansetron (ZOFRAN) IV, oxyCODONE, sodium chloride flush  Diet Orders (From admission, onward)     Start     Ordered   04/30/21 1701  Diet heart healthy/carb modified Room service appropriate? Yes; Fluid consistency: Thin  Diet effective now       Question Answer Comment  Diet-HS Snack? Nothing  Room service appropriate? Yes   Fluid consistency: Thin      04/30/21 1700            DVT prophylaxis: Place and maintain sequential compression device Start: 05/06/21 1002 apixaban (ELIQUIS) tablet 5 mg     Code Status: Full Code  Family Communication: no family at bedside   Status is: Inpatient  Remains inpatient appropriate because:Inpatient level of care appropriate due to severity of illness  Dispo: The patient  is from: Home              Anticipated d/c is to: Home              Patient currently is not medically stable to d/c.   Difficult to place patient No   Level of care: Med-Surg  Consultants:  Vascular surgery  Orthopedic surgery   Procedures:  none  Microbiology  none  Antimicrobials: none    Objective: Vitals:   05/11/21 0750 05/11/21 1709 05/12/21 0225 05/12/21 0746  BP: (!) 130/98 (!) 135/94 (!) 137/97 (!) 140/100  Pulse: 75 71 83 84  Resp: 18 18  15   Temp: 98.8 F (37.1 C) 98.9 F (37.2 C) 98.7 F (37.1 C) 99 F (37.2 C)  TempSrc: Oral  Oral Oral  SpO2: 96% 100% 100% 93%  Weight:      Height:        Intake/Output Summary (Last 24 hours) at 05/12/2021 0930 Last data filed at 05/12/2021 0500 Gross per 24 hour  Intake --  Output 700 ml  Net -700 ml   Filed Weights   04/30/21 0615 04/30/21 1236 05/01/21 0606  Weight: 75 kg 75 kg 74.7 kg    Examination:  Constitutional: NAD Eyes: no scleral icterus ENMT: Mucous membranes are moist.  Neck: normal, supple Respiratory: clear to auscultation bilaterally, no wheezing, no crackles. Normal respiratory effort Cardiovascular: Regular rate and rhythm, no murmurs / rubs / gallops. No LE edema. Good peripheral pulses Abdomen: non distended, no tenderness. Bowel sounds positive.  Musculoskeletal: no clubbing / cyanosis.  Skin: no rashes Neurologic: non focal   Data Reviewed: I have independently reviewed following labs and imaging studies   CBC: Recent Labs  Lab 05/08/21 0459 05/09/21 0440 05/10/21 0431 05/11/21 0339 05/12/21 0334  WBC 8.9 8.9 8.5 9.0 11.1*  NEUTROABS 6.2 6.2 6.1 6.3 8.7*  HGB 8.1* 8.0* 8.6* 8.7* 9.2*  HCT 26.6* 27.6* 29.1* 28.6* 30.0*  MCV 95.0 95.8 95.7 94.7 93.5  PLT 263 270 289 285 397   Basic Metabolic Panel: Recent Labs  Lab 05/07/21 0350 05/08/21 0459 05/09/21 0440 05/10/21 0431 05/11/21 0339 05/12/21 0334  NA 142 143 142 140 141  --   K 3.2* 4.1 3.9 3.9 3.6  --   CL  114* 115* 114* 112* 111  --   CO2 23 24 22 24 24   --   GLUCOSE 174* 107* 98 129* 129*  --   BUN 9 8 8 6 6   --   CREATININE 1.04* 1.02* 1.09* 0.96 1.03*  --   CALCIUM 8.3* 8.0* 8.4* 8.5* 8.4*  --   MG 1.7 2.1 1.7 1.9 1.7 1.6*  PHOS 3.2 3.0 3.2 3.7 3.7 3.7   Liver Function Tests: Recent Labs  Lab 05/07/21 0350 05/08/21 0459 05/09/21 0440 05/10/21 0431 05/11/21 0339  AST 9* 9* 9* 8* 10*  ALT 9 8 9 9 8   ALKPHOS 74 69 76 78 79  BILITOT 0.5 0.8 0.6 0.4 0.5  PROT 5.3* 4.8* 5.4*  5.2* 5.2*  ALBUMIN 1.6* 1.3* 1.5* 1.5* 1.5*   Coagulation Profile: No results for input(s): INR, PROTIME in the last 168 hours. HbA1C: No results for input(s): HGBA1C in the last 72 hours. CBG: Recent Labs  Lab 05/11/21 1143 05/11/21 1708 05/11/21 2019 05/12/21 0311 05/12/21 0712  GLUCAP 160* 82 72 92 86    Recent Results (from the past 240 hour(s))  Culture, Urine     Status: Abnormal   Collection Time: 05/08/21  5:10 AM   Specimen: Urine, Random  Result Value Ref Range Status   Specimen Description URINE, RANDOM  Final   Special Requests   Final    NONE Performed at [facility], [place] [address] [place], [place] [address]    Culture MULTIPLE SPECIES PRESENT, SUGGEST RECOLLECTION (A)  Final   Report Status 05/09/2021 FINAL  Final     Radiology Studies: No results found.   Marzetta Board, MD, PhD [facility]  Between 7 am - 7 pm I am available, please contact me via Amion (for emergencies) or Securechat (non urgent messages)  Between 7 pm - 7 am I am not available, please contact night coverage MD/APP via Amion

## 2021-05-12 NOTE — TOC Progression Note (Signed)
Transition of Care [facility]) - Progression Note    Patient Details  Name: AELLA RONDA MRN: [id number] Date of Birth: [birth year]  Transition [facility]) CM/SW Contact  Milinda Antis, LCSWA Phone Number: 05/12/2021, 11:37 AM  Clinical Narrative:     11:37-  CSW contacted [name] with [facility] (LTC facility that the patient arrived from) to inquire about bed availability.  There was no answer.  CSW left a VM requesting a returned call.  CSW observed that [facility] had accepted the patient.  CSW spoke with the [place] at [facility] and was informed that the patient was accepted by mistake.  [facility] does not have any long term beds available.    Pending: bed availability at [facility] or bed offer from another facility.       Expected Discharge Plan and Services                                                 Social Determinants of Health (SDOH) Interventions    Readmission Risk Interventions Readmission Risk Prevention Plan 10/31/2020 08/27/2020  Transportation Screening Complete Complete  Medication Review Press photographer) Complete Complete  PCP or Specialist appointment within 3-5 days of discharge - Complete  HRI or [place] - Complete  SW Recovery Care/Counseling Consult Complete Complete  Palliative Care Screening Not Applicable Not Applicable  Skilled Nursing Facility Complete Complete  Some recent data might be hidden
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
## 2021-05-12 NOTE — Progress Notes (Signed)
Pt refused her wound change at this time.

## 2021-05-13 DIAGNOSIS — B958 Unspecified staphylococcus as the cause of diseases classified elsewhere: Secondary | ICD-10-CM

## 2021-05-13 DIAGNOSIS — Z789 Other specified health status: Secondary | ICD-10-CM | POA: Diagnosis not present

## 2021-05-13 DIAGNOSIS — K219 Gastro-esophageal reflux disease without esophagitis: Secondary | ICD-10-CM

## 2021-05-13 DIAGNOSIS — I70263 Atherosclerosis of native arteries of extremities with gangrene, bilateral legs: Secondary | ICD-10-CM | POA: Diagnosis not present

## 2021-05-13 DIAGNOSIS — R6520 Severe sepsis without septic shock: Secondary | ICD-10-CM | POA: Diagnosis not present

## 2021-05-13 DIAGNOSIS — F332 Major depressive disorder, recurrent severe without psychotic features: Secondary | ICD-10-CM | POA: Diagnosis not present

## 2021-05-13 DIAGNOSIS — A419 Sepsis, unspecified organism: Secondary | ICD-10-CM | POA: Diagnosis not present

## 2021-05-13 DIAGNOSIS — F331 Major depressive disorder, recurrent, moderate: Secondary | ICD-10-CM | POA: Diagnosis not present

## 2021-05-13 DIAGNOSIS — R7881 Bacteremia: Secondary | ICD-10-CM

## 2021-05-13 LAB — PHOSPHORUS: Phosphorus: 3.7 mg/dL (ref 2.5–4.6)

## 2021-05-13 LAB — CBC WITH DIFFERENTIAL/PLATELET
Abs Immature Granulocytes: 0.06 10*3/uL (ref 0.00–0.07)
Basophils Absolute: 0.1 10*3/uL (ref 0.0–0.1)
Basophils Relative: 1 %
Eosinophils Absolute: 0.2 10*3/uL (ref 0.0–0.5)
Eosinophils Relative: 2 %
HCT: 29.3 % — ABNORMAL LOW (ref 36.0–46.0)
Hemoglobin: 8.6 g/dL — ABNORMAL LOW (ref 12.0–15.0)
Immature Granulocytes: 1 %
Lymphocytes Relative: 9 %
Lymphs Abs: 1 10*3/uL (ref 0.7–4.0)
MCH: 28.1 pg (ref 26.0–34.0)
MCHC: 29.4 g/dL — ABNORMAL LOW (ref 30.0–36.0)
MCV: 95.8 fL (ref 80.0–100.0)
Monocytes Absolute: 0.5 10*3/uL (ref 0.1–1.0)
Monocytes Relative: 4 %
Neutro Abs: 9.1 10*3/uL — ABNORMAL HIGH (ref 1.7–7.7)
Neutrophils Relative %: 83 %
Platelets: 319 10*3/uL (ref 150–400)
RBC: 3.06 MIL/uL — ABNORMAL LOW (ref 3.87–5.11)
RDW: 16.6 % — ABNORMAL HIGH (ref 11.5–15.5)
WBC: 10.9 10*3/uL — ABNORMAL HIGH (ref 4.0–10.5)
nRBC: 0 % (ref 0.0–0.2)

## 2021-05-13 LAB — BASIC METABOLIC PANEL
Anion gap: 9 (ref 5–15)
BUN: 8 mg/dL (ref 6–20)
CO2: 21 mmol/L — ABNORMAL LOW (ref 22–32)
Calcium: 8.6 mg/dL — ABNORMAL LOW (ref 8.9–10.3)
Chloride: 111 mmol/L (ref 98–111)
Creatinine, Ser: 1.15 mg/dL — ABNORMAL HIGH (ref 0.44–1.00)
GFR, Estimated: 56 mL/min — ABNORMAL LOW (ref >60)
Glucose, Bld: 135 mg/dL — ABNORMAL HIGH (ref 70–99)
Potassium: 3.6 mmol/L (ref 3.5–5.1)
Sodium: 141 mmol/L (ref 135–145)

## 2021-05-13 LAB — GLUCOSE, CAPILLARY
Glucose-Capillary: 99 mg/dL (ref 70–99)
Glucose-Capillary: 141 mg/dL — ABNORMAL HIGH (ref 70–99)
Glucose-Capillary: 192 mg/dL — ABNORMAL HIGH (ref 70–99)
Glucose-Capillary: 143 mg/dL — ABNORMAL HIGH (ref 70–99)

## 2021-05-13 LAB — MAGNESIUM: Magnesium: 1.7 mg/dL (ref 1.7–2.4)

## 2021-05-13 MED ORDER — ADULT MULTIVITAMIN W/MINERALS CH
1.0000 | ORAL_TABLET | Freq: Every day | ORAL | Status: DC
  Administered 2021-05-14 – 2021-05-27 (×14): 1 via ORAL
  Filled 2021-05-13 (×17): qty 1

## 2021-05-13 MED ORDER — ENSURE ENLIVE PO LIQD
237.0000 mL | Freq: Two times a day (BID) | ORAL | Status: DC
  Administered 2021-05-13 – 2021-05-20 (×8): 237 mL via ORAL

## 2021-05-13 NOTE — Assessment & Plan Note (Signed)
-  this was likely in the setting of multiple LE ischemic ulcers. ID consulted and followed patient, she was maintained on antibiotics which have finished on 7/14 -remains afebrile, monitor off antibiotics

## 2021-05-13 NOTE — TOC Progression Note (Signed)
Transition of Care [facility]) - Progression Note    Patient Details  Name: [name] MRN: [id number] Date of Birth: August 11, 1966  Transition of Care [facility]) CM/SW Contact  Milinda Antis, LCSWA Phone Number: 05/13/2021, 9:36 AM  Clinical Narrative:     08:54-  CSW called [name] with [facility] to inquire about bed availability.  There was no answer.  CSW left a VM requesting a returned call.    Pending: Bed availability.         Expected Discharge Plan and Services                                                 Social Determinants of Health (SDOH) Interventions    Readmission Risk Interventions Readmission Risk Prevention Plan 10/31/2020 08/27/2020  Transportation Screening Complete Complete  Medication Review Press photographer) Complete Complete  PCP or Specialist appointment within 3-5 days of discharge - Complete  HRI or [place] - Complete  SW Recovery Care/Counseling Consult Complete Complete  Palliative Care Screening Not Applicable Not Applicable  Skilled Nursing Facility Complete Complete  Some recent data might be hidden

## 2021-05-13 NOTE — Assessment & Plan Note (Signed)
-   Continue chronic O2

## 2021-05-13 NOTE — Assessment & Plan Note (Signed)
-   Continue Eliquis and metoprolol 

## 2021-05-13 NOTE — Assessment & Plan Note (Signed)
-   See lack of decision-making

## 2021-05-13 NOTE — Assessment & Plan Note (Signed)
-   Repleted as needed

## 2021-05-13 NOTE — Assessment & Plan Note (Signed)
Continue oxybutynin

## 2021-05-13 NOTE — Assessment & Plan Note (Addendum)
-   Continue Haldol, Cogentin, fluoxetine - psychiatry following, appreciate assistance - will obtain intermittent EKGs per rec's while on Haldol (QTc 472 on 7/20) - repeat EKG on 7/27 to follow up QTc

## 2021-05-13 NOTE — Assessment & Plan Note (Addendum)
-  patient has history of CKD3b. Baseline creat ~ 1.2, eGFR 35 - 39 - patient presents with increase in creat >0.3 mg/dL above baseline presumed to have occurred within past 7 days PTA - resolved with IVF

## 2021-05-13 NOTE — Progress Notes (Signed)
Progress Note    [name]   OIN:[id number]  DOB: September 21, 1966  DOA: 04/21/2021     22  PCP: Caprice Renshaw, MD  CC: leg pain  Hospital Course: [name] is a 55 yo female with PMH atrial fib on Eliquis, seizure disorder, tobacco abuse, COPD, PVD, HTN, DMII, dCHF, CKD, medical noncompliance, lack of capacity who presented with B/L leg pain from her nursing facility.  After work-up, she was ultimately found to have bilateral lower extremity leg ulcers with cellulitis, dry gangrene, and staph bacteremia. She was also evaluated by psychiatry during hospitalization and found to have lack of capacity due to inability to explain her reasoning for decision-making as well as lack of ability to explain reasoning for declining medical interventions including wound dressing changes.  Interval History:  No events overnight.  Chart reviewed and first time meeting patient today.  Seems that her mood was more improved and she was more cooperative and pleasant this morning.  She has been declining dressing changes at times.  She is still followed by psychiatry.  ROS: Review of systems not obtained due to patient factors. Cognitive impairment   Assessment & Plan: * Sepsis (HCC)-resolved as of 05/13/2021 - LE gangrene as source - now s/p stent and dressing changes  Patient incapable of making informed decisions - See psychiatry evaluation.  Patient very clearly lacks decision-making capacity in regards to her care and disposition - If ongoing refusal of dressing changes and/or meds, will have to discuss next steps with her daughter/decision-maker - discharge placement pending a facility and bed found   Atherosclerosis of native artery of both legs with gangrene [facility]) -vascular and orthopedic surgery consulted, she is now s/p right superficial femoral / popliteal artery stenting on 6/29 by Dr. Stanford Breed and left lower extremity angiogram with second order cannulation  7/6 -intermittently refusing  care; see decision making capacity; essentially patient does not have ability/capacity to refuse meds and care/dressing changes. If this continues, her daughter is Media planner, and we would need to discuss the wishes of daughter in regards to her mother's care (e.g. forcing meds and/or dressing changes, etc)  Atrial fibrillation with RVR ([place]) - Continue Eliquis and metoprolol  Acute renal failure superimposed on stage 3b chronic kidney disease (HCC)-resolved as of 05/13/2021 - patient has history of CKD3b. Baseline creat ~ 1.2, eGFR 35 - 39 - patient presents with increase in creat >0.3 mg/dL above baseline presumed to have occurred within past 7 days PTA - resolved with IVF   Seizure disorder ([place]) - Continue Keppra  MDD (major depressive disorder), recurrent, severe, with psychosis ([place]) - Continue Haldol, Cogentin, fluoxetine - psychiatry following, appreciate assistance - will obtain EKG per rec's while on Haldol  Bacteremia due to Staphylococcus-resolved as of 05/13/2021 -this was likely in the setting of multiple LE ischemic ulcers. ID consulted and followed patient, she was maintained on antibiotics which have finished on 7/14 -remains afebrile, monitor off antibiotics  GERD (gastroesophageal reflux disease) - Continue Protonix  Chronic respiratory failure with hypoxia (HCC) - Continue chronic O2  Pressure injury of skin Perineum stage I pressure injury, present on arrival Coccyx right, stage II pressure injury, present on admission  COPD (chronic obstructive pulmonary disease) (HCC) - No signs/symptoms of exacerbation  Urinary incontinence - Continue oxybutynin  Medical non-compliance - See lack of decision-making  Insomnia - Continue trazodone  DMII (diabetes mellitus, type 2) ([place]) - continue SSI and CBGs  Bleeding hemorrhoids-resolved as of 05/13/2021 - Topical hydrocortisone  Hypomagnesemia-resolved as of 05/13/2021 - Repleted as  needed  Hypokalemia-resolved as of 05/13/2021 - Repleted as needed    Old records reviewed in assessment of this patient  Antimicrobials:   DVT prophylaxis: apixaban (ELIQUIS) tablet 5 mg   Code Status:   Code Status: Full Code Family Communication:   Disposition Plan: Status is: Inpatient  Remains inpatient appropriate because:Unsafe d/c plan  Dispo: The patient is from: Home              Anticipated d/c is to:  pending placement               Patient currently is medically stable to d/c.   Difficult to place patient Yes      Risk of unplanned readmission score: Unplanned Admission- Pilot do not use: 78.53   Objective: Blood pressure 136/79, pulse 86, temperature 98 F (36.7 C), temperature source Oral, resp. rate 16, height '5\' 1"'  (1.549 m), weight 74.7 kg, last menstrual period 05/19/2016, SpO2 92 %.  Examination: General appearance: alert and no distress Head: Normocephalic, without obvious abnormality, atraumatic Eyes:  EOMI Lungs: clear to auscultation bilaterally Heart: regular rate and rhythm and S1, S2 normal Abdomen: normal findings: bowel sounds normal and soft, non-tender Extremities:  LE dressings in place Skin: mobility and turgor normal Neurologic: followed commands, moves all 4 extremities  Consultants:  Psych  Procedures:    Data Reviewed: I have personally reviewed following labs and imaging studies Results for orders placed or performed during the hospital encounter of 04/21/21 (from the past 24 hour(s))  Glucose, capillary     Status: Abnormal   Collection Time: 05/12/21  4:46 PM  Result Value Ref Range   Glucose-Capillary 129 (H) 70 - 99 mg/dL  Glucose, capillary     Status: Abnormal   Collection Time: 05/12/21  9:18 PM  Result Value Ref Range   Glucose-Capillary 113 (H) 70 - 99 mg/dL  Glucose, capillary     Status: None   Collection Time: 05/13/21  4:23 AM  Result Value Ref Range   Glucose-Capillary 99 70 - 99 mg/dL  CBC with  Differential/Platelet     Status: Abnormal   Collection Time: 05/13/21  9:00 AM  Result Value Ref Range   WBC 10.9 (H) 4.0 - 10.5 K/uL   RBC 3.06 (L) 3.87 - 5.11 MIL/uL   Hemoglobin 8.6 (L) 12.0 - 15.0 g/dL   HCT 29.3 (L) 36.0 - 46.0 %   MCV 95.8 80.0 - 100.0 fL   MCH 28.1 26.0 - 34.0 pg   MCHC 29.4 (L) 30.0 - 36.0 g/dL   RDW 16.6 (H) 11.5 - 15.5 %   Platelets 319 150 - 400 K/uL   nRBC 0.0 0.0 - 0.2 %   Neutrophils Relative % 83 %   Neutro Abs 9.1 (H) 1.7 - 7.7 K/uL   Lymphocytes Relative 9 %   Lymphs Abs 1.0 0.7 - 4.0 K/uL   Monocytes Relative 4 %   Monocytes Absolute 0.5 0.1 - 1.0 K/uL   Eosinophils Relative 2 %   Eosinophils Absolute 0.2 0.0 - 0.5 K/uL   Basophils Relative 1 %   Basophils Absolute 0.1 0.0 - 0.1 K/uL   Immature Granulocytes 1 %   Abs Immature Granulocytes 0.06 0.00 - 0.07 K/uL  Magnesium     Status: None   Collection Time: 05/13/21  9:00 AM  Result Value Ref Range   Magnesium 1.7 1.7 - 2.4 mg/dL  Phosphorus     Status: None  Collection Time: 05/13/21  9:00 AM  Result Value Ref Range   Phosphorus 3.7 2.5 - 4.6 mg/dL  Basic metabolic panel     Status: Abnormal   Collection Time: 05/13/21  9:00 AM  Result Value Ref Range   Sodium 141 135 - 145 mmol/L   Potassium 3.6 3.5 - 5.1 mmol/L   Chloride 111 98 - 111 mmol/L   CO2 21 (L) 22 - 32 mmol/L   Glucose, Bld 135 (H) 70 - 99 mg/dL   BUN 8 6 - 20 mg/dL   Creatinine, Ser 1.15 (H) 0.44 - 1.00 mg/dL   Calcium 8.6 (L) 8.9 - 10.3 mg/dL   GFR, Estimated 56 (L) >60 mL/min   Anion gap 9 5 - 15  Glucose, capillary     Status: Abnormal   Collection Time: 05/13/21 11:09 AM  Result Value Ref Range   Glucose-Capillary 141 (H) 70 - 99 mg/dL   *Note: Due to a large number of results and/or encounters for the requested time period, some results have not been displayed. A complete set of results can be found in Results Review.    Recent Results (from the past 240 hour(s))  Culture, Urine     Status: Abnormal    Collection Time: 05/08/21  5:10 AM   Specimen: Urine, Random  Result Value Ref Range Status   Specimen Description URINE, RANDOM  Final   Special Requests   Final    NONE Performed at [facility], [address] [place], [place] [address]    Culture MULTIPLE SPECIES PRESENT, SUGGEST RECOLLECTION (A)  Final   Report Status 05/09/2021 FINAL  Final     Radiology Studies: No results found. [name] EKG SITE RITE  Final Result    HYBRID OR IMAGING ([facility] ONLY)  Final Result    [name] EKG SITE RITE  Final Result    HYBRID OR IMAGING ([facility] ONLY)  Final Result    [name] EKG SITE RITE  Final Result    DG Chest Port 1 View  Final Result      Scheduled Meds:  amLODipine  10 mg Oral Daily   apixaban  5 mg Oral BID   aspirin EC  81 mg Oral Daily   benztropine  0.5 mg Oral BID   Chlorhexidine Gluconate Cloth  6 each Topical Daily   feeding supplement  237 mL Oral BID BM   FLUoxetine  20 mg Oral Daily   haloperidol  5 mg Oral TID   hydrocortisone   Rectal BID   insulin aspart  0-5 Units Subcutaneous QHS   insulin aspart  0-9 Units Subcutaneous TID WC   insulin aspart  3 Units Subcutaneous TID WC   levETIRAcetam  750 mg Oral BID   metoprolol tartrate  50 mg Oral BID   midazolam  2 mg Intravenous Once   multivitamin with minerals  1 tablet Oral Daily   oxybutynin  5 mg Oral QHS   pantoprazole  40 mg Oral Daily   rosuvastatin  10 mg Oral Daily   traZODone  50 mg Oral QHS   umeclidinium bromide  1 puff Inhalation Daily   zinc sulfate  220 mg Oral Daily   PRN Meds: acetaminophen **OR** acetaminophen, lip balm, LORazepam, morphine injection, ondansetron **OR** ondansetron (ZOFRAN) IV, oxyCODONE, sodium chloride flush Continuous Infusions:  lactated ringers 10 mL/hr at 04/30/21 1255     LOS: 22 days  Time spent: Greater than 50% of the 35 minute visit was spent in  counseling/coordination of care for the patient as laid out in the A&P.   Dwyane Dee, MD [facility] 05/13/2021, 3:30 PM

## 2021-05-13 NOTE — Hospital Course (Addendum)
[name] is a 55 yo female with PMH atrial fib on Eliquis, seizure disorder, tobacco abuse, COPD, PVD, HTN, DMII, dCHF, CKD, medical noncompliance who presented with B/L leg pain from her nursing facility.  After work-up, she was ultimately found to have bilateral lower extremity leg ulcers with cellulitis, dry gangrene, and staph bacteremia.  See below for further extensive problem based plan and course.

## 2021-05-13 NOTE — Care Management Important Message (Signed)
Important Message  Patient Details  Name: [name] MRN: [id number] Date of Birth: 04-Feb-1966   Medicare Important Message Given:  Yes     Mavi Un P Lakiya Cottam 05/13/2021, 1:49 PM

## 2021-05-13 NOTE — Assessment & Plan Note (Addendum)
-   Topical hydrocortisone; d/c now

## 2021-05-13 NOTE — Progress Notes (Addendum)
Initial Nutrition Assessment  DOCUMENTATION CODES:   Obesity unspecified  INTERVENTION:   -MVI with minerals daily -Magic cup TID with meals, each supplement provides 290 kcal and 9 grams of protein  -Ensure Enlive po BID, each supplement provides 350 kcal and 20 grams of protein  -Downgrade diet to dysphagia 3 (advanced mechanical soft) for ease of intake  NUTRITION DIAGNOSIS:   Increased nutrient needs related to wound healing as evidenced by estimated needs.  GOAL:   Patient will meet greater than or equal to 90% of their needs  MONITOR:   PO intake, Supplement acceptance, Labs, Weight trends, Skin, I & O's  REASON FOR ASSESSMENT:   Low [place]    ASSESSMENT:   [name] is a 55 y.o. female with history of tobacco use disorder, severe PVD, paroxysmal atrial fibrillation currently on anticoagulant, hyperlipidemia, hypertension, uncontrolled T2DM, CHF, CKD, and more presents to the ED with a chief complaint of bilateral leg pain. Has no other complaints, no change in bowel habits. No change in urinary habits. Denies dyspnea and cough. Wears 2L oxygen at baseline.  Pt admitted with bilateral lower leg ulcerations and cellulitis.   6/29- s/p right superficial femoral and popliteal artery stenting  Reviewed I/O's: -410 ml x 24 hours and -3.6 L since 04/29/21  UOP: 650 ml x 24 hours  Spoke with pt at bedside, who reports "I haven't eaten anything in over a week". Observed pt's breakfast tray, which was unattempted. Noted meal completions 25-40%. Pt reports that she was eating well PTA (consumed 3 meals per day at [facility]). Per pt, she is not eating because she does not like the food ("everything needs a little bit of salt"); when asked what she wanted to eat, pt replied "pigs feet and broccoli casserole".   Noted pt with missing teeth- pt reports "I have to go to the dentist". She shares that she has difficulty eating some solid foods and is amenable to diet downgrade for  ease of intake.   Pt unsure of UBW. She does not think she has lost weight. Reviewed wt hx; pt has experienced a 18.5% wt loss over the past month, which is significant for time frame.   Discussed importance of good meal and supplement intake to promote healing.   Per chart review, pt has a history of refusing care. Pt was cooperative with this RD and allowed nurse tech to check blood sugar during visit.   Pt with poor oral intake and would benefit from nutrient dense supplement. One Ensure Enlive supplement provides 350 kcals, 20 grams protein, and 44-45 grams of carbohydrate vs one Glucerna shake supplement, which provides 220 kcals, 10 grams of protein, and 26 grams of carbohydrate. Given pt's hx of DM, RD will reassess adequacy of PO intake, CBGS, and adjust supplement regimen as appropriate at follow-up.    Medications reviewed and include keppra and zinc sulfate.   Lab Results  Component Value Date   HGBA1C 11.1 (H) 03/23/2021   PTA DM medications are 0.5 ml trulicity weekly and 0-53 units insulin lispro TID with meals.   Labs reviewed: CBGS: 90-129 (inpatient orders for glycemic control are 0-5 units insulin aspart daily at bedtime, 0-9 units insulin aspart TID with meals, and 3 units insulin aspart TID with meals).    NUTRITION - FOCUSED PHYSICAL EXAM:  Flowsheet Row Most Recent Value  Orbital Region No depletion  Upper Arm Region Moderate depletion  Thoracic and Lumbar Region No depletion  Buccal Region No depletion  Temple Region No depletion  Clavicle Bone Region No depletion  Clavicle and Acromion Bone Region No depletion  Scapular Bone Region No depletion  Dorsal Hand Mild depletion  Patellar Region Moderate depletion  Anterior Thigh Region Moderate depletion  Posterior Calf Region Moderate depletion  Edema (RD Assessment) None  Hair Reviewed  Eyes Reviewed  Mouth Reviewed  Skin Reviewed  Nails Reviewed       Diet Order:   Diet Order             DIET DYS 3  Room service appropriate? Yes; Fluid consistency: Thin  Diet effective now                   EDUCATION NEEDS:   Education needs have been addressed  Skin:  Skin Assessment: Skin Integrity Issues: Skin Integrity Issues:: Other (Comment), Incisions Incisions: closed rt groin, rt and lt pretibial Other: MASD rt and lt buttocks  Last BM:  05/13/21  Height:   Ht Readings from Last 1 Encounters:  04/30/21 5\' 1"  (1.549 m)    Weight:   Wt Readings from Last 1 Encounters:  05/01/21 74.7 kg    Ideal Body Weight:  47.7 kg  BMI:  Body mass index is 31.12 kg/m.  Estimated Nutritional Needs:   Kcal:  1700-1900  Protein:  95-110 grams  Fluid:  > 1.7 L    [name], RD, LDN, [place] Registered Dietitian II Certified Diabetes Care and Education Specialist Please refer to [facility] for RD and/or RD on-call/weekend/after hours pager

## 2021-05-13 NOTE — Plan of Care (Signed)
  Problem: Education: Goal: Knowledge of General Education information will improve Description: Including pain rating scale, medication(s)/side effects and non-pharmacologic comfort measures Outcome: Progressing   Problem: Health Behavior/Discharge Planning: Goal: Ability to manage health-related needs will improve Outcome: Progressing   Problem: Clinical Measurements: Goal: Ability to maintain clinical measurements within normal limits will improve Outcome: Progressing   Problem: Pain Managment: Goal: General experience of comfort will improve Outcome: Progressing   Problem: Safety: Goal: Ability to remain free from injury will improve Outcome: Progressing   Problem: Skin Integrity: Goal: Risk for impaired skin integrity will decrease Outcome: Progressing   

## 2021-05-13 NOTE — Assessment & Plan Note (Addendum)
-  vascular and orthopedic surgery consulted, she is now s/p right superficial femoral / popliteal artery stenting on 6/29 by Dr. Stanford Breed and left lower extremityangiogram with secondorder cannulation 7/6 -intermittently was refusing care; see decision making capacity;  - as of 7/25, psych now feels patient has capacity;  - after SEVERAL discussions with patient, family and vascular surgery, patient now consenting for surgery on 7/27 (to myself and Dr. Stanford Breed present); tentative plan is to post for surgery on 7/29. I have called and talked to Chad to inform them of surgery tentatively on Friday and to continue supporting their mom in her decision; of course the possibility stands that she may "change her mind" which will again raise concern for her ability to retain capacity (this may very well be too large of a decision for her to understand the risks/benefits) but at this time she does understand the surgery is above knee amputations and is consenting

## 2021-05-13 NOTE — Assessment & Plan Note (Signed)
Continue Keppra.

## 2021-05-13 NOTE — Assessment & Plan Note (Addendum)
-   LE gangrene as source - now s/p stent and dressing changes - see atherosclerosis

## 2021-05-13 NOTE — Assessment & Plan Note (Signed)
Continue trazodone 

## 2021-05-13 NOTE — Assessment & Plan Note (Signed)
-   continue SSI and CBGs 

## 2021-05-13 NOTE — Assessment & Plan Note (Signed)
-   No signs/symptoms of exacerbation 

## 2021-05-13 NOTE — Consult Note (Signed)
[facility] Face-to-Face Psychiatry Consult   Reason for Consult:  Mood dysregulation Referring Physician:  Nolberto Hanlon, MD Patient Identification: JARRETT CHICOINE MRN:  [id number] Principal Diagnosis: Sepsis [facility]) Diagnosis:  Principal Problem:   Sepsis ([place]) Active Problems:   Essential hypertension   INSOMNIA   Medical non-compliance   Poor mobility   Lower extremity weakness   HTN (hypertension), malignant   Hypokalemia   Diabetic neuropathy (HCC)   CKD (chronic kidney disease) stage 3, GFR 30-59 ml/min (HCC)   Severe recurrent major depression without psychotic features ([place])   COPD (chronic obstructive pulmonary disease) (HCC)   At high risk for falls   PAF (paroxysmal atrial fibrillation) (HCC)   Atrial fibrillation with RVR (HCC)   Seizures (HCC)   Sacral ulcer, limited to breakdown of skin (HCC)   Reflux esophagitis   Multiple duodenal ulcers   Chronic respiratory failure with hypoxia (HCC)   Hypomagnesemia   PVD (peripheral vascular disease) (HCC)   Atherosclerosis of native artery of both legs with gangrene (HCC)   MDD (major depressive disorder), recurrent episode, moderate (HCC)   Bleeding hemorrhoids   Total Time spent with patient: 15 minutes  Subjective:   ANNELYSE REY is a 55 y.o. female patient admitted with  bilateral leg pain w/ PMH of depression, tobacco use disorder, severe PVD, paroxysmal aticoagulant, HLD, HTN, uncontrolled T2DM, CHF, CKD. Marland Kitchen  HPI:  On assessment today patient reports she is in a "good mood." Patient reports that she had a "party" yesterday with her family. Patient reports that she still has no appetite and refuses to eat. Provider ask patient if she understands what happens when she does not eat, unfortunately patient response was unintelligible but she continued to say the same thing each time the question was asked. Patient also remarked that she believed that she was getting enough nutrition through her "line" and pointed at her IV.  Patient reported that she planned to watch TV today. Patient requested that the blinds be closed but after provider refused (delirium precautions) patient did not perseverate on it as usual and instead moved on and was willing to talk without the covers over her head today. Patient denied SI, HI, and AVH.  Past Psychiatric History: Reports OP psych 4-5 visits total. Also report being dx with "Clinical depression" Recalls being on haldol for depression. Recalls taking Paxil and Prozac in the past.   Of note patient was not taking her Keppra prior to hospitalization despite having a seizure in 07/2020.   Risk to Self:  NO Risk to Others:  NO Prior Inpatient Therapy:  NO Prior Outpatient Therapy:  YES  Past Medical History:  Past Medical History:  Diagnosis Date   Alcohol use    Ankle fracture, lateral malleolus, closed 2013   Anxiety    Aortic atherosclerosis ([place]) 03/10/2021   Breast mass, left 2013   CHF (congestive heart failure) ([place])    a. EF 20-25% by echo in 05/2016 with cath showing normal cors b. EF 50-55% in 07/2020 c. 01/2021: EF at 55-60% with moderate LVH   Chronic anemia    CKD (chronic kidney disease), stage III (HCC)    Cocaine abuse (HCC)    COPD (chronic obstructive pulmonary disease) ([place])    Diabetes mellitus, type 2 ([place])    Diabetic Charcot foot ([place])    Essential hypertension    History of cardiomyopathy    History of GI bleed 2011   Hyperlipidemia    Noncompliance    Obesity  PAF (paroxysmal atrial fibrillation) (HCC)    Panic attacks    PAT (paroxysmal atrial tachycardia) (HCC)    Previously on Amiodarone   Seizures ([place]) 10/01/2020   Sleep apnea    Not on CPAP   Stroke ([place]) 2018   Tobacco abuse    Urinary incontinence     Past Surgical History:  Procedure Laterality Date   ANGIOPLASTY ILLIAC ARTERY Right 04/23/2021   Procedure: ANGIOPLASTY AND STENT SUPERFICIAL FEMORAL ARTERY;  Surgeon: Cherre Robins, MD;  Location: [place];  Service: Vascular;   Laterality: Right;   AORTOGRAM  04/23/2021   Procedure: AORTOGRAM;  Surgeon: Cherre Robins, MD;  Location: [place];  Service: Vascular;;   AORTOGRAM Left 04/30/2021   Procedure: left lower extremity angiogram with second order cannulation;  Surgeon: Cherre Robins, MD;  Location: [place];  Service: Vascular;  Laterality: Left;   BIOPSY  11/12/2020   Procedure: BIOPSY;  Surgeon: Harvel Quale, MD;  [facility];  Service: Gastroenterology;;   BREAST BIOPSY     CARDIAC CATHETERIZATION N/A 07/28/2016   Procedure: Left Heart Cath and Coronary Angiography;  Surgeon: Jettie Booze, MD;  Location: [place] [facility];  Service: Cardiovascular;  Laterality: N/A;   COLONOSCOPY N/A 05/10/2019   Procedure: COLONOSCOPY;  Surgeon: Danie Binder, MD;  [facility];  Service: Endoscopy;  Laterality: N/A;  Phenergan 12.5 mg IV in pre-op   DILATION AND CURETTAGE OF UTERUS     ESOPHAGOGASTRODUODENOSCOPY (EGD) WITH PROPOFOL N/A 11/12/2020   Procedure: ESOPHAGOGASTRODUODENOSCOPY (EGD) WITH PROPOFOL;  Surgeon: Harvel Quale, MD;  [facility];  Service: Gastroenterology;  Laterality: N/A;   I & D EXTREMITY Bilateral 09/22/2017   Procedure: BILATERAL DEBRIDEMENT LEG/FOOT ULCERS, APPLY VERAFLO WOUND VAC;  Surgeon: Newt Minion, MD;  Location: [place];  Service: Orthopedics;  Laterality: Bilateral;   I & D EXTREMITY Right 10/11/2018   Procedure: IRRIGATION AND DEBRIDEMENT RIGHT HAND;  Surgeon: [name], MD;  Location: [place];  Service: Orthopedics;  Laterality: Right;   I & D EXTREMITY Right 10/13/2018   Procedure: REPEAT IRRIGATION AND DEBRIDEMENT RIGHT HAND;  Surgeon: [name], MD;  Location: [place];  Service: Orthopedics;  Laterality: Right;   I & D EXTREMITY Right 11/22/2018   Procedure: IRRIGATION AND DEBRIDEMENT AND PINNING RIGHT HAND;  Surgeon: [name], MD;  Location: [place];  Service: Orthopedics;  Laterality: Right;   IR RADIOLOGIST  EVAL & MGMT  07/05/2018   LOWER EXTREMITY ANGIOGRAM Bilateral 04/23/2021   Procedure: RIGHT  AND LEFT LOWER EXTREMITY ANGIOGRAM;  Surgeon: Cherre Robins, MD;  Location: [place];  Service: Vascular;  Laterality: Bilateral;   POLYPECTOMY  05/10/2019   Procedure: POLYPECTOMY;  Surgeon: Danie Binder, MD;  [facility];  Service: Endoscopy;;   SKIN SPLIT GRAFT Bilateral 09/28/2017   Procedure: BILATERAL SPLIT THICKNESS SKIN GRAFT LEGS/FEET AND APPLY VAC;  Surgeon: Newt Minion, MD;  Location: [place];  Service: Orthopedics;  Laterality: Bilateral;   SKIN SPLIT GRAFT Right 11/22/2018   Procedure: SKIN GRAFT SPLIT THICKNESS;  Surgeon: [name], MD;  Location: [place];  Service: Orthopedics;  Laterality: Right;   Family History:  Family History  Problem Relation Age of Onset   Hypertension Mother    Heart attack Mother    Hypertension Father        CABG    Hypertension Sister    Hypertension Brother    Hypertension Sister  Cancer Sister        breast    Arthritis Other    Cancer Other    Diabetes Other    Asthma Other    Hypertension Daughter    Hypertension Son    Family Psychiatric  History: None reported Social History:  Social History   Substance and Sexual Activity  Alcohol Use Not Currently   Alcohol/week: 0.0 standard drinks   Comment: Quit in 2017     Social History   Substance and Sexual Activity  Drug Use Not Currently   Frequency: 3.0 times per week   Types: Marijuana, Cocaine   Comment: none since August 2018    Social History   Socioeconomic History   Marital status: Widowed    Spouse name: Not on file   Number of children: 2   Years of education: 9   Highest education level: 9th grade  Occupational History   Occupation: Disabled  Tobacco Use   Smoking status: Some Days    Packs/day: 0.25    Years: 20.00    Pack years: 5.00    Types: Cigarettes    Last attempt to quit: 06/03/2017    Years since quitting: 3.9   Smokeless tobacco:  Never  Vaping Use   Vaping Use: Never used  Substance and Sexual Activity   Alcohol use: Not Currently    Alcohol/week: 0.0 standard drinks    Comment: Quit in 2017   Drug use: Not Currently    Frequency: 3.0 times per week    Types: Marijuana, Cocaine    Comment: none since August 2018   Sexual activity: Not on file  Other Topics Concern   Not on file  Social History Narrative   Right handed   Drinks 1-2 cups caffeine daily   Social Determinants of Health   Financial Resource Strain: Medium Risk   Difficulty of Paying Living Expenses: Somewhat hard  Food Insecurity: Food Insecurity Present   Worried About Charity fundraiser in the Last Year: Sometimes true   Ran Out of Food in the Last Year: Sometimes true  Transportation Needs: No Transportation Needs   Lack of Transportation (Medical): No   Lack of Transportation (Non-Medical): No  Physical Activity: Insufficiently Active   Days of Exercise per Week: 7 days   Minutes of Exercise per Session: 20 min  Stress: Stress Concern Present   Feeling of Stress : To some extent  Social Connections: Moderately Isolated   Frequency of Communication with Friends and Family: More than three times a week   Frequency of Social Gatherings with Friends and Family: More than three times a week   Attends Religious Services: More than 4 times per year   Active Member of [employer] or Organizations: No   Attends Archivist Meetings: Never   Marital Status: Widowed   Additional Social History:    Allergies:  No Known Allergies  Labs:  Results for orders placed or performed during the hospital encounter of 04/21/21 (from the past 48 hour(s))  Glucose, capillary     Status: Abnormal   Collection Time: 05/11/21 11:43 AM  Result Value Ref Range   Glucose-Capillary 160 (H) 70 - 99 mg/dL    Comment: Glucose reference range applies only to samples taken after fasting for at least 8 hours.  Glucose, capillary     Status: None    Collection Time: 05/11/21  5:08 PM  Result Value Ref Range   Glucose-Capillary 82 70 - 99 mg/dL  Comment: Glucose reference range applies only to samples taken after fasting for at least 8 hours.  Glucose, capillary     Status: None   Collection Time: 05/11/21  8:19 PM  Result Value Ref Range   Glucose-Capillary 72 70 - 99 mg/dL    Comment: Glucose reference range applies only to samples taken after fasting for at least 8 hours.  Glucose, capillary     Status: None   Collection Time: 05/12/21  3:11 AM  Result Value Ref Range   Glucose-Capillary 92 70 - 99 mg/dL    Comment: Glucose reference range applies only to samples taken after fasting for at least 8 hours.  CBC with Differential/Platelet     Status: Abnormal   Collection Time: 05/12/21  3:34 AM  Result Value Ref Range   WBC 11.1 (H) 4.0 - 10.5 K/uL   RBC 3.21 (L) 3.87 - 5.11 MIL/uL   Hemoglobin 9.2 (L) 12.0 - 15.0 g/dL   HCT 30.0 (L) 36.0 - 46.0 %   MCV 93.5 80.0 - 100.0 fL   MCH 28.7 26.0 - 34.0 pg   MCHC 30.7 30.0 - 36.0 g/dL   RDW 16.7 (H) 11.5 - 15.5 %   Platelets 328 150 - 400 K/uL   nRBC 0.2 0.0 - 0.2 %   Neutrophils Relative % 78 %   Neutro Abs 8.7 (H) 1.7 - 7.7 K/uL   Lymphocytes Relative 13 %   Lymphs Abs 1.4 0.7 - 4.0 K/uL   Monocytes Relative 5 %   Monocytes Absolute 0.6 0.1 - 1.0 K/uL   Eosinophils Relative 2 %   Eosinophils Absolute 0.3 0.0 - 0.5 K/uL   Basophils Relative 1 %   Basophils Absolute 0.1 0.0 - 0.1 K/uL   Immature Granulocytes 1 %   Abs Immature Granulocytes 0.06 0.00 - 0.07 K/uL    Comment: Performed at [facility], [address] [place], [place] [address]  Magnesium     Status: Abnormal   Collection Time: 05/12/21  3:34 AM  Result Value Ref Range   Magnesium 1.6 (L) 1.7 - 2.4 mg/dL    Comment: Performed at [facility] [address] [place], [place] [address]  Phosphorus     Status: None   Collection Time: 05/12/21  3:34 AM  Result Value Ref Range   Phosphorus 3.7 2.5  - 4.6 mg/dL    Comment: Performed at [facility] [address] [place], [place] [address]  Glucose, capillary     Status: None   Collection Time: 05/12/21  7:12 AM  Result Value Ref Range   Glucose-Capillary 86 70 - 99 mg/dL    Comment: Glucose reference range applies only to samples taken after fasting for at least 8 hours.  Glucose, capillary     Status: None   Collection Time: 05/12/21 11:21 AM  Result Value Ref Range   Glucose-Capillary 90 70 - 99 mg/dL    Comment: Glucose reference range applies only to samples taken after fasting for at least 8 hours.  Glucose, capillary     Status: Abnormal   Collection Time: 05/12/21  4:46 PM  Result Value Ref Range   Glucose-Capillary 129 (H) 70 - 99 mg/dL    Comment: Glucose reference range applies only to samples taken after fasting for at least 8 hours.  Glucose, capillary     Status: Abnormal   Collection Time: 05/12/21  9:18 PM  Result Value Ref Range   Glucose-Capillary 113 (H)  70 - 99 mg/dL    Comment: Glucose reference range applies only to samples taken after fasting for at least 8 hours.  Glucose, capillary     Status: None   Collection Time: 05/13/21  4:23 AM  Result Value Ref Range   Glucose-Capillary 99 70 - 99 mg/dL    Comment: Glucose reference range applies only to samples taken after fasting for at least 8 hours.  CBC with Differential/Platelet     Status: Abnormal   Collection Time: 05/13/21  9:00 AM  Result Value Ref Range   WBC 10.9 (H) 4.0 - 10.5 K/uL   RBC 3.06 (L) 3.87 - 5.11 MIL/uL   Hemoglobin 8.6 (L) 12.0 - 15.0 g/dL   HCT 29.3 (L) 36.0 - 46.0 %   MCV 95.8 80.0 - 100.0 fL   MCH 28.1 26.0 - 34.0 pg   MCHC 29.4 (L) 30.0 - 36.0 g/dL   RDW 16.6 (H) 11.5 - 15.5 %   Platelets 319 150 - 400 K/uL   nRBC 0.0 0.0 - 0.2 %   Neutrophils Relative % 83 %   Neutro Abs 9.1 (H) 1.7 - 7.7 K/uL   Lymphocytes Relative 9 %   Lymphs Abs 1.0 0.7 - 4.0 K/uL   Monocytes Relative 4 %   Monocytes Absolute 0.5 0.1 - 1.0  K/uL   Eosinophils Relative 2 %   Eosinophils Absolute 0.2 0.0 - 0.5 K/uL   Basophils Relative 1 %   Basophils Absolute 0.1 0.0 - 0.1 K/uL   Immature Granulocytes 1 %   Abs Immature Granulocytes 0.06 0.00 - 0.07 K/uL    Comment: Performed at [facility], [address] [place], [place] [address]  Magnesium     Status: None   Collection Time: 05/13/21  9:00 AM  Result Value Ref Range   Magnesium 1.7 1.7 - 2.4 mg/dL    Comment: Performed at [facility], [place] [address] [place], [place] [address]  Phosphorus     Status: None   Collection Time: 05/13/21  9:00 AM  Result Value Ref Range   Phosphorus 3.7 2.5 - 4.6 mg/dL    Comment: Performed at [facility], [place] [address], [place], [place] [address]  Basic metabolic panel     Status: Abnormal   Collection Time: 05/13/21  9:00 AM  Result Value Ref Range   Sodium 141 135 - 145 mmol/L   Potassium 3.6 3.5 - 5.1 mmol/L   Chloride 111 98 - 111 mmol/L   CO2 21 (L) 22 - 32 mmol/L   Glucose, Bld 135 (H) 70 - 99 mg/dL    Comment: Glucose reference range applies only to samples taken after fasting for at least 8 hours.   BUN 8 6 - 20 mg/dL   Creatinine, Ser 1.15 (H) 0.44 - 1.00 mg/dL   Calcium 8.6 (L) 8.9 - 10.3 mg/dL   GFR, Estimated 56 (L) >60 mL/min    Comment: (NOTE) Calculated using the CKD-EPI Creatinine Equation (2021)    Anion gap 9 5 - 15    Comment: Performed at [facility] [address], [place],  [address]   *Note: Due to a large number of results and/or encounters for the requested time period, some results have not been displayed. A complete set of results can be found in Results Review.    Current Facility-Administered Medications  Medication Dose Route Frequency Provider Last Rate Last Admin   acetaminophen (TYLENOL) tablet 650 mg  650 mg Oral Q6H PRN Wynetta Emery, Clanford L, MD   650 mg at 05/10/21 1005   Or   acetaminophen (TYLENOL) suppository 650 mg  650 mg Rectal Q6H PRN Johnson,  Clanford L, MD       amLODipine (NORVASC) tablet 10 mg  10 mg Oral Daily Johnson, Clanford L, MD   10 mg at 05/13/21 0858   apixaban (ELIQUIS) tablet 5 mg  5 mg Oral BID Edwin Dada, MD   5 mg at 05/13/21 0857   aspirin EC tablet 81 mg  81 mg Oral Daily Dagoberto Ligas, PA-C   81 mg at 05/13/21 0858   benztropine (COGENTIN) tablet 0.5 mg  0.5 mg Oral BID Damita Dunnings B, MD   0.5 mg at 05/13/21 0858   Chlorhexidine Gluconate Cloth 2 % PADS 6 each  6 each Topical Daily Allie Bossier, MD   6 each at 05/12/21 0942   FLUoxetine (PROZAC) capsule 20 mg  20 mg Oral Daily Damita Dunnings B, MD   20 mg at 05/13/21 0857   haloperidol (HALDOL) tablet 5 mg  5 mg Oral TID Damita Dunnings B, MD   5 mg at 05/13/21 0858   hydrocortisone (ANUSOL-HC) 2.5 % rectal cream   Rectal BID Allie Bossier, MD   Given at 05/13/21 0900   insulin aspart (novoLOG) injection 0-5 Units  0-5 Units Subcutaneous QHS Johnson, Clanford L, MD       insulin aspart (novoLOG) injection 0-9 Units  0-9 Units Subcutaneous TID WC Johnson, Clanford L, MD   1 Units at 05/12/21 1721   insulin aspart (novoLOG) injection 3 Units  3 Units Subcutaneous TID WC Johnson, Clanford L, MD   3 Units at 05/11/21 1151   lactated ringers infusion   Intravenous Continuous Annye Asa, MD 10 mL/hr at 04/30/21 1255 Restarted at 04/30/21 1430   levETIRAcetam (KEPPRA) tablet 750 mg  750 mg Oral BID Johnson, Clanford L, MD   750 mg at 05/13/21 0858   lip balm (CARMEX) ointment   Topical PRN Cristal Ford, DO       LORazepam (ATIVAN) injection 2 mg  2 mg Intravenous Q6H PRN Allie Bossier, MD   2 mg at 05/11/21 2051   metoprolol tartrate (LOPRESSOR) tablet 50 mg  50 mg Oral BID Wynetta Emery, Clanford L, MD   50 mg at 05/13/21 0858   midazolam (VERSED) injection 2 mg  2 mg Intravenous Once Allie Bossier, MD       morphine 2 MG/ML injection 2 mg  2 mg Intravenous Q4H PRN Cristal Ford, DO   2 mg at 05/10/21 2247   ondansetron (ZOFRAN) tablet 4 mg   4 mg Oral Q6H PRN Johnson, Clanford L, MD       Or   ondansetron (ZOFRAN) injection 4 mg  4 mg Intravenous Q6H PRN Johnson, Clanford L, MD       oxybutynin (DITROPAN-XL) 24 hr tablet 5 mg  5 mg Oral QHS Johnson, Clanford L, MD   5 mg at 05/12/21 2207   oxyCODONE (Oxy IR/ROXICODONE) immediate release tablet 5-10 mg  5-10 mg Oral Q6H PRN Cristal Ford, DO   10 mg at 05/12/21 1545   pantoprazole (PROTONIX) EC tablet 40 mg  40 mg Oral Daily Johnson, Clanford L, MD   40 mg at 05/13/21 0857   rosuvastatin (CRESTOR) tablet 10 mg  10 mg Oral Daily Johnson, Clanford L, MD   10 mg at 05/13/21 0858   sodium chloride flush (NS)  0.9 % injection 10-40 mL  10-40 mL Intracatheter PRN Allie Bossier, MD   10 mL at 05/06/21 2725   traZODone (DESYREL) tablet 50 mg  50 mg Oral QHS Johnson, Clanford L, MD   50 mg at 05/12/21 2207   umeclidinium bromide (INCRUSE ELLIPTA) 62.5 MCG/INH 1 puff  1 puff Inhalation Daily Danford, Suann Larry, MD       zinc sulfate capsule 220 mg  220 mg Oral Daily Johnson, Clanford L, MD   220 mg at 05/13/21 3664    Musculoskeletal: Strength & Muscle Tone: decreased and atrophy Gait & Station:  remain in bed on exam Patient leans: N/A            Psychiatric Specialty Exam:  Presentation  General Appearance: Appropriate for Environment  Eye Contact:Fair  Speech:Slurred (no teeth)  Speech Volume:Decreased  Handedness: No data recorded  Mood and Affect  Mood:Euthymic  Affect:Constricted   Thought Process  Thought Processes:Linear  Descriptions of Associations:Circumstantial  Orientation:Partial  Thought Content:Illogical  History of Schizophrenia/Schizoaffective disorder:No data recorded Duration of Psychotic Symptoms:No data recorded Hallucinations:Hallucinations: None  Ideas of Reference:None  Suicidal Thoughts:Suicidal Thoughts: No  Homicidal Thoughts:Homicidal Thoughts: No   Sensorium  Memory:Immediate Fair; Recent Poor; Remote  Fair  Judgment:Impaired  Insight:None   Executive Functions  Concentration:Fair  Attention Span:Fair  Recall:Poor  Fund of Knowledge:Poor  Language:Poor   Psychomotor Activity  Psychomotor Activity:Psychomotor Activity: Decreased   Assets  Assets:Communication Skills; Social Support   Sleep  Sleep:Sleep: Fair   Physical Exam: Physical Exam HENT:     Head: Normocephalic and atraumatic.  Eyes:     Extraocular Movements: Extraocular movements intact.     Conjunctiva/sclera: Conjunctivae normal.  Cardiovascular:     Rate and Rhythm: Normal rate.  Pulmonary:     Effort: Pulmonary effort is normal.     Breath sounds: Normal breath sounds.  Abdominal:     General: Abdomen is flat.  Musculoskeletal:        General: Swelling present.  Skin:    General: Skin is warm and dry.  Neurological:     Mental Status: She is alert.     Comments: Same level of orientation as yesterday   Review of Systems  Constitutional:  Negative for chills and fever.  HENT:  Negative for hearing loss.   Eyes:  Negative for blurred vision.  Respiratory:  Negative for cough and wheezing.   Cardiovascular:  Negative for chest pain.  Gastrointestinal:  Negative for abdominal pain.  Neurological:  Negative for dizziness.  Psychiatric/Behavioral:  Negative for suicidal ideas.   Blood pressure 136/79, pulse 86, temperature 98 F (36.7 C), temperature source Oral, resp. rate 16, height 5\' 1"  (1.549 m), weight 74.7 kg, last menstrual period 05/19/2016, SpO2 92 %. Body mass index is 31.12 kg/m.  Treatment Plan Summary: Medication management MDD,recurrent, severe, w/ psychosis Traits of Schizoid personality Patient was the most pleasant today than seen in any prior assessments. Patient continues to have linear, illogical thoughts but perseverating less. Patient is still refusing care but is now willing to compromise to adjust the timing of her care rather than completely refusing. Patient  appears to have more affect during conversations as well. - Unsure how much longer patient will be in the hospital but her mood has played a significant role in her willingness to get treatment, she does appear to be improving. Will follow on every other day basis for now.  - Continue Prozac 20mg  - Continue Haldol 5mg  PO TID -  Continue cogentin 0.5mg  BID - Consider appetite stimulant - Recommend EKG  Will follow patient on an every other day basis.  Disposition: No evidence of imminent risk to self or others at present.   PGY-2 Freida Busman, MD 05/13/2021 10:04 AM

## 2021-05-13 NOTE — Progress Notes (Addendum)
This nurse arrived to patient roomed and discussed the fact that her drsg is over due and needs to be changed and that her PICC line needed to be flushed. (Second nurse to try to change drsg today.) She refused assessment of the drsg, refused drsg change, and flushing of her PICC line. Started cussing and swearing calling the nurse names, yelling for nurse to get out of the room. Notified, Daune Perch RN, patient's nurse, that patient is refusing line care. Fran Lowes, RN VAST

## 2021-05-13 NOTE — Assessment & Plan Note (Addendum)
-   See psychiatry evaluation.  Patient initially clearly lacked decision-making capacity in regards to her care and disposition - as of 7/25, psychiatry has evaluated and patient now capable to explaining reasons for decisions and understands her choices - discussed at length with psychiatry, [name], and Rosalyn Gess: [place]: [phone]

## 2021-05-13 NOTE — Progress Notes (Signed)
Patient refused lab draw this morning will reschedule at 0800.

## 2021-05-13 NOTE — Assessment & Plan Note (Signed)
Perineum stage I pressure injury, present on arrival Coccyx right, stage II pressure injury, present on admission

## 2021-05-13 NOTE — Assessment & Plan Note (Signed)
Continue Protonix °

## 2021-05-14 DIAGNOSIS — I70263 Atherosclerosis of native arteries of extremities with gangrene, bilateral legs: Secondary | ICD-10-CM | POA: Diagnosis not present

## 2021-05-14 DIAGNOSIS — A419 Sepsis, unspecified organism: Secondary | ICD-10-CM | POA: Diagnosis not present

## 2021-05-14 DIAGNOSIS — Z789 Other specified health status: Secondary | ICD-10-CM | POA: Diagnosis not present

## 2021-05-14 LAB — CBC WITH DIFFERENTIAL/PLATELET
Abs Immature Granulocytes: 0.03 10*3/uL (ref 0.00–0.07)
Basophils Absolute: 0.1 10*3/uL (ref 0.0–0.1)
Basophils Relative: 1 %
Eosinophils Absolute: 0.3 10*3/uL (ref 0.0–0.5)
Eosinophils Relative: 3 %
HCT: 28.2 % — ABNORMAL LOW (ref 36.0–46.0)
Hemoglobin: 8.7 g/dL — ABNORMAL LOW (ref 12.0–15.0)
Immature Granulocytes: 0 %
Lymphocytes Relative: 16 %
Lymphs Abs: 1.5 10*3/uL (ref 0.7–4.0)
MCH: 28.8 pg (ref 26.0–34.0)
MCHC: 30.9 g/dL (ref 30.0–36.0)
MCV: 93.4 fL (ref 80.0–100.0)
Monocytes Absolute: 0.6 10*3/uL (ref 0.1–1.0)
Monocytes Relative: 7 %
Neutro Abs: 6.8 10*3/uL (ref 1.7–7.7)
Neutrophils Relative %: 73 %
Platelets: 300 10*3/uL (ref 150–400)
RBC: 3.02 MIL/uL — ABNORMAL LOW (ref 3.87–5.11)
RDW: 16.5 % — ABNORMAL HIGH (ref 11.5–15.5)
WBC: 9.3 10*3/uL (ref 4.0–10.5)
nRBC: 0 % (ref 0.0–0.2)

## 2021-05-14 LAB — GLUCOSE, CAPILLARY
Glucose-Capillary: 133 mg/dL — ABNORMAL HIGH (ref 70–99)
Glucose-Capillary: 190 mg/dL — ABNORMAL HIGH (ref 70–99)
Glucose-Capillary: 349 mg/dL — ABNORMAL HIGH (ref 70–99)
Glucose-Capillary: 262 mg/dL — ABNORMAL HIGH (ref 70–99)

## 2021-05-14 LAB — BASIC METABOLIC PANEL
Anion gap: 10 (ref 5–15)
BUN: 6 mg/dL (ref 6–20)
CO2: 22 mmol/L (ref 22–32)
Calcium: 8.5 mg/dL — ABNORMAL LOW (ref 8.9–10.3)
Chloride: 109 mmol/L (ref 98–111)
Creatinine, Ser: 1.13 mg/dL — ABNORMAL HIGH (ref 0.44–1.00)
GFR, Estimated: 57 mL/min — ABNORMAL LOW (ref >60)
Glucose, Bld: 149 mg/dL — ABNORMAL HIGH (ref 70–99)
Potassium: 3.3 mmol/L — ABNORMAL LOW (ref 3.5–5.1)
Sodium: 141 mmol/L (ref 135–145)

## 2021-05-14 LAB — MAGNESIUM: Magnesium: 1.5 mg/dL — ABNORMAL LOW (ref 1.7–2.4)

## 2021-05-14 LAB — PHOSPHORUS: Phosphorus: 3.3 mg/dL (ref 2.5–4.6)

## 2021-05-14 MED ORDER — POTASSIUM CHLORIDE CRYS ER 20 MEQ PO TBCR
30.0000 meq | EXTENDED_RELEASE_TABLET | ORAL | Status: AC
  Administered 2021-05-14: 30 meq via ORAL
  Filled 2021-05-14 (×2): qty 1

## 2021-05-14 MED ORDER — MAGNESIUM SULFATE 2 GM/50ML IV SOLN
2.0000 g | Freq: Once | INTRAVENOUS | Status: AC
  Administered 2021-05-14: 2 g via INTRAVENOUS
  Filled 2021-05-14: qty 50

## 2021-05-14 MED ORDER — NICOTINE 7 MG/24HR TD PT24
7.0000 mg | MEDICATED_PATCH | Freq: Every day | TRANSDERMAL | Status: DC
  Administered 2021-05-14 – 2021-05-29 (×15): 7 mg via TRANSDERMAL
  Filled 2021-05-14 (×16): qty 1

## 2021-05-14 NOTE — Plan of Care (Signed)
  Problem: Pain Managment: Goal: General experience of comfort will improve Outcome: Progressing   Problem: Safety: Goal: Ability to remain free from injury will improve Outcome: Progressing   Problem: Skin Integrity: Goal: Risk for impaired skin integrity will decrease Outcome: Progressing   

## 2021-05-14 NOTE — Progress Notes (Signed)
Pt noted to be belligerent towards staff. Pt refusing dressing change to BLE and line care by IV team. Pt would yell out and make unnecessary comments towards staff. Pt also noted to hallucinate, seeing things in the room and trying to get out of the bed and 'walk.' Pt was however cooperative with taking meds, vitals signs and checking blood sugars.

## 2021-05-14 NOTE — Progress Notes (Signed)
Progress Note    [name]   HQR:[id number]  DOB: 05-16-1966  DOA: 04/21/2021     23  PCP: Caprice Renshaw, MD  CC: leg pain  Hospital Course: [name] is a 55 yo female with PMH atrial fib on Eliquis, seizure disorder, tobacco abuse, COPD, PVD, HTN, DMII, dCHF, CKD, medical noncompliance, lack of capacity who presented with B/L leg pain from her nursing facility.  After work-up, she was ultimately found to have bilateral lower extremity leg ulcers with cellulitis, dry gangrene, and staph bacteremia. She was also evaluated by psychiatry during hospitalization and found to have lack of capacity due to inability to explain her reasoning for decision-making as well as lack of ability to explain reasoning for declining medical interventions including wound dressing changes.  Interval History:  Overnight notes reviewed from nursing staff.  Patient was belligerent towards staff and refused initial attempts of dressing changes and PICC line care this morning.  Later in the morning she allowed wound care from her nurse. Called and spoke with daughter this morning and called son twice this afternoon but no answer. Patient when seen was otherwise resting in bed comfortably this morning although was overtly confused.  ROS: Review of systems not obtained due to patient factors. Cognitive impairment   Assessment & Plan: * Sepsis (HCC)-resolved as of 05/13/2021 - LE gangrene as source - now s/p stent and dressing changes  Patient incapable of making informed decisions - See psychiatry evaluation.  Patient very clearly lacks decision-making capacity in regards to her care and disposition - If ongoing refusal of dressing changes and/or meds, will have to likely force treatment given lack of capacity. I've discussed this personally with [name] who is in agreement if necessary/needed. Future discussions will also be held with daughter [name]) and son [name] [place], [phone])  -  discharge placement pending a facility and bed found  - I have personally attempted to call [name] twice today in the afternoon (about 1 hour apart), 05/14/21, with no answer in order to provide him a medical update; I called and spoke with [name] on 05/14/21 @ 0836  Atherosclerosis of native artery of both legs with gangrene [facility]) -vascular and orthopedic surgery consulted, she is now s/p right superficial femoral / popliteal artery stenting on 6/29 by Dr. Stanford Breed and left lower extremity angiogram with second order cannulation  7/6 -intermittently refusing care; see decision making capacity; essentially patient does not have ability/capacity to refuse meds and care/dressing changes. If this continues, her daughter and son technically are shared decision makers and BOTH will need to be updated if major changes in care and/or consent is needed (this has been discussed at length with risk management and hospital accreditation liaison)  - psych also in agreement that patient cannot refuse care due to lack of capacity; if patient refuses, would first reattempt later in the day but if necessary, then temporary restraints may need to be utilized while performing dressing changes, etc  Atrial fibrillation with RVR ([place]) - Continue Eliquis and metoprolol  Acute renal failure superimposed on stage 3b chronic kidney disease (HCC)-resolved as of 05/13/2021 - patient has history of CKD3b. Baseline creat ~ 1.2, eGFR 35 - 39 - patient presents with increase in creat >0.3 mg/dL above baseline presumed to have occurred within past 7 days PTA - resolved with IVF   Seizure disorder ([place]) - Continue Keppra  MDD (major depressive disorder), recurrent, severe, with psychosis ([place]) - Continue Haldol, Cogentin, fluoxetine - psychiatry following, appreciate  assistance - will obtain intermittent EKGs per rec's while on Haldol (QTc 472 on 7/20)  Bacteremia due to Staphylococcus-resolved as of 05/13/2021 -this was  likely in the setting of multiple LE ischemic ulcers. ID consulted and followed patient, she was maintained on antibiotics which have finished on 7/14 -remains afebrile, monitor off antibiotics  GERD (gastroesophageal reflux disease) - Continue Protonix  Chronic respiratory failure with hypoxia (HCC) - Continue chronic O2  Pressure injury of skin Perineum stage I pressure injury, present on arrival Coccyx right, stage II pressure injury, present on admission  COPD (chronic obstructive pulmonary disease) (HCC) - No signs/symptoms of exacerbation  Urinary incontinence - Continue oxybutynin  Medical non-compliance - See lack of decision-making  Insomnia - Continue trazodone  DMII (diabetes mellitus, type 2) ([place]) - continue SSI and CBGs  Bleeding hemorrhoids-resolved as of 05/13/2021 - Topical hydrocortisone  Hypomagnesemia-resolved as of 05/13/2021 - Repleted as needed  Hypokalemia-resolved as of 05/13/2021 - Repleted as needed   Old records reviewed in assessment of this patient  Antimicrobials:   DVT prophylaxis: apixaban (ELIQUIS) tablet 5 mg   Code Status:   Code Status: Full Code Family Communication:   Disposition Plan: Status is: Inpatient  Remains inpatient appropriate because:Unsafe d/c plan  Dispo: The patient is from: Home              Anticipated d/c is to:  pending placement               Patient currently is medically stable to d/c.   Difficult to place patient Yes  Risk of unplanned readmission score: Unplanned Admission- Pilot do not use: 79.69   Objective: Blood pressure 118/80, pulse 78, temperature 98.5 F (36.9 C), temperature source Oral, resp. rate 18, height '5\' 1"'  (1.549 m), weight 74.7 kg, last menstrual period 05/19/2016, SpO2 92 %.  Examination: General appearance: alert and no distress Head: Normocephalic, without obvious abnormality, atraumatic Eyes:  EOMI Lungs: clear to auscultation bilaterally Heart: regular rate and rhythm  and S1, S2 normal Abdomen: normal findings: bowel sounds normal and soft, non-tender Extremities:  LE dressings in place Skin: mobility and turgor normal Neurologic: followed commands, moves all 4 extremities  Consultants:  Psych  Procedures:    Data Reviewed: I have personally reviewed following labs and imaging studies Results for orders placed or performed during the hospital encounter of 04/21/21 (from the past 24 hour(s))  Glucose, capillary     Status: Abnormal   Collection Time: 05/13/21  5:57 PM  Result Value Ref Range   Glucose-Capillary 192 (H) 70 - 99 mg/dL  Glucose, capillary     Status: Abnormal   Collection Time: 05/13/21  9:07 PM  Result Value Ref Range   Glucose-Capillary 143 (H) 70 - 99 mg/dL  CBC with Differential/Platelet     Status: Abnormal   Collection Time: 05/14/21  5:06 AM  Result Value Ref Range   WBC 9.3 4.0 - 10.5 K/uL   RBC 3.02 (L) 3.87 - 5.11 MIL/uL   Hemoglobin 8.7 (L) 12.0 - 15.0 g/dL   HCT 28.2 (L) 36.0 - 46.0 %   MCV 93.4 80.0 - 100.0 fL   MCH 28.8 26.0 - 34.0 pg   MCHC 30.9 30.0 - 36.0 g/dL   RDW 16.5 (H) 11.5 - 15.5 %   Platelets 300 150 - 400 K/uL   nRBC 0.0 0.0 - 0.2 %   Neutrophils Relative % 73 %   Neutro Abs 6.8 1.7 - 7.7 K/uL   Lymphocytes Relative  16 %   Lymphs Abs 1.5 0.7 - 4.0 K/uL   Monocytes Relative 7 %   Monocytes Absolute 0.6 0.1 - 1.0 K/uL   Eosinophils Relative 3 %   Eosinophils Absolute 0.3 0.0 - 0.5 K/uL   Basophils Relative 1 %   Basophils Absolute 0.1 0.0 - 0.1 K/uL   Immature Granulocytes 0 %   Abs Immature Granulocytes 0.03 0.00 - 0.07 K/uL  Magnesium     Status: Abnormal   Collection Time: 05/14/21  5:06 AM  Result Value Ref Range   Magnesium 1.5 (L) 1.7 - 2.4 mg/dL  Phosphorus     Status: None   Collection Time: 05/14/21  5:06 AM  Result Value Ref Range   Phosphorus 3.3 2.5 - 4.6 mg/dL  Basic metabolic panel     Status: Abnormal   Collection Time: 05/14/21  5:06 AM  Result Value Ref Range   Sodium  141 135 - 145 mmol/L   Potassium 3.3 (L) 3.5 - 5.1 mmol/L   Chloride 109 98 - 111 mmol/L   CO2 22 22 - 32 mmol/L   Glucose, Bld 149 (H) 70 - 99 mg/dL   BUN 6 6 - 20 mg/dL   Creatinine, Ser 1.13 (H) 0.44 - 1.00 mg/dL   Calcium 8.5 (L) 8.9 - 10.3 mg/dL   GFR, Estimated 57 (L) >60 mL/min   Anion gap 10 5 - 15  Glucose, capillary     Status: Abnormal   Collection Time: 05/14/21  7:38 AM  Result Value Ref Range   Glucose-Capillary 133 (H) 70 - 99 mg/dL  Glucose, capillary     Status: Abnormal   Collection Time: 05/14/21 11:37 AM  Result Value Ref Range   Glucose-Capillary 190 (H) 70 - 99 mg/dL  Glucose, capillary     Status: Abnormal   Collection Time: 05/14/21  3:49 PM  Result Value Ref Range   Glucose-Capillary 349 (H) 70 - 99 mg/dL   *Note: Due to a large number of results and/or encounters for the requested time period, some results have not been displayed. A complete set of results can be found in Results Review.    Recent Results (from the past 240 hour(s))  Culture, Urine     Status: Abnormal   Collection Time: 05/08/21  5:10 AM   Specimen: Urine, Random  Result Value Ref Range Status   Specimen Description URINE, RANDOM  Final   Special Requests   Final    NONE Performed at [facility], [address] [place], [place] [address]    Culture MULTIPLE SPECIES PRESENT, SUGGEST RECOLLECTION (A)  Final   Report Status 05/09/2021 FINAL  Final     Radiology Studies: No results found. [name] EKG SITE RITE  Final Result    HYBRID OR IMAGING ([facility] ONLY)  Final Result    [name] EKG SITE RITE  Final Result    HYBRID OR IMAGING ([facility] ONLY)  Final Result    [name] EKG SITE RITE  Final Result    DG Chest Port 1 View  Final Result      Scheduled Meds:  amLODipine  10 mg Oral Daily   apixaban  5 mg Oral BID   aspirin EC  81 mg Oral Daily   benztropine  0.5 mg Oral BID   Chlorhexidine Gluconate Cloth  6 each Topical Daily   feeding supplement  237 mL Oral BID BM    FLUoxetine  20 mg Oral Daily   haloperidol  5 mg  Oral TID   hydrocortisone   Rectal BID   insulin aspart  0-5 Units Subcutaneous QHS   insulin aspart  0-9 Units Subcutaneous TID WC   insulin aspart  3 Units Subcutaneous TID WC   levETIRAcetam  750 mg Oral BID   metoprolol tartrate  50 mg Oral BID   midazolam  2 mg Intravenous Once   multivitamin with minerals  1 tablet Oral Daily   nicotine  7 mg Transdermal Daily   oxybutynin  5 mg Oral QHS   pantoprazole  40 mg Oral Daily   potassium chloride  30 mEq Oral Q4H   rosuvastatin  10 mg Oral Daily   traZODone  50 mg Oral QHS   umeclidinium bromide  1 puff Inhalation Daily   zinc sulfate  220 mg Oral Daily   PRN Meds: acetaminophen **OR** acetaminophen, lip balm, LORazepam, morphine injection, ondansetron **OR** ondansetron (ZOFRAN) IV, oxyCODONE, sodium chloride flush Continuous Infusions:  lactated ringers 10 mL/hr at 04/30/21 1255     LOS: 23 days  Time spent: Greater than 50% of the 35 minute visit was spent in counseling/coordination of care for the patient as laid out in the A&P.   Dwyane Dee, MD [facility] 05/14/2021, 4:28 PM

## 2021-05-14 NOTE — Progress Notes (Signed)
Wound care to bilateral legs completed per orders. Dressings c/d/I.

## 2021-05-14 NOTE — Consult Note (Addendum)
[facility] Face-to-Face Psychiatry Consult   Reason for Consult:  Mood dysregulation Referring Physician:  Nolberto Hanlon, MD Patient Identification: [name] MRN:  [id number] Principal Diagnosis: Sepsis [facility]) Diagnosis:  Active Problems:   DMII (diabetes mellitus, type 2) ([place])   Essential hypertension   Insomnia   Medical non-compliance   Poor mobility   HTN (hypertension), malignant   Diabetic neuropathy ([place])   MDD (major depressive disorder), recurrent, severe, with psychosis ([place])   Urinary incontinence   COPD (chronic obstructive pulmonary disease) (HCC)   PAF (paroxysmal atrial fibrillation) (HCC)   Atrial fibrillation with RVR ([place])   Pressure injury of skin   Seizure disorder ([place])   Sacral ulcer, limited to breakdown of skin ([place])   Multiple duodenal ulcers   Chronic respiratory failure with hypoxia ([place])   Atherosclerosis of native artery of both legs with gangrene ([place])   Patient incapable of making informed decisions   GERD (gastroesophageal reflux disease)   Total Time spent with patient: 20 minutes  Subjective:   [name] is a 55 y.o. female patient admitted with  bilateral leg pain w/ PMH of depression, tobacco use disorder, severe PVD, paroxysmal aticoagulant, HLD, HTN, uncontrolled T2DM, CHF, CKD. [name]  HPI:  Patient seen today despite plan for every other day follow up due to reports of Hallucinations by night RN when patient was woken for early AM dressing changes. On assessment this AM patient is adamant that she has not been having hallucinations reporting, "if I haven't mentioned it then I'm not." Patient denies SI, HI, and any AVH. Patient reports she doing "ok" this AM and is hungry. Patient did drink some Ensure and was waiting on breakfast. Patient reported that she felt the dressing on her Left leg was tight and was willing to have her dressings changed. Patient was again more pleasant this AM and pulled the covers off of her face when provider came  and then replaced them when provider left, although it should be noted that the sun was shining where patient had herself positioned.   Past Psychiatric History:Reports OP psych 4-5 visits total. Also report being dx with "Clinical depression" Recalls being on haldol for depression. Recalls taking Paxil and Prozac in the past.   Of note patient was not taking her Keppra prior to hospitalization despite having a seizure in 07/2020.   Risk to Self:  NO Risk to Others:  NO Prior Inpatient Therapy:  NO Prior Outpatient Therapy:  YES  Past Medical History:  Past Medical History:  Diagnosis Date   Alcohol use    Ankle fracture, lateral malleolus, closed 2013   Anxiety    Aortic atherosclerosis ([place]) 03/10/2021   Breast mass, left 2013   CHF (congestive heart failure) ([place])    a. EF 20-25% by echo in 05/2016 with cath showing normal cors b. EF 50-55% in 07/2020 c. 01/2021: EF at 55-60% with moderate LVH   Chronic anemia    CKD (chronic kidney disease), stage III (HCC)    Cocaine abuse (HCC)    COPD (chronic obstructive pulmonary disease) ([place])    Diabetes mellitus, type 2 (HCC)    Diabetic Charcot foot ([place])    Essential hypertension    History of cardiomyopathy    History of GI bleed 2011   Hyperlipidemia    Noncompliance    Obesity    PAF (paroxysmal atrial fibrillation) (HCC)    Panic attacks    PAT (paroxysmal atrial tachycardia) (HCC)    Previously on  Amiodarone   Seizures ([place]) 10/01/2020   Sleep apnea    Not on CPAP   Stroke ([place]) 2018   Tobacco abuse    Urinary incontinence     Past Surgical History:  Procedure Laterality Date   ANGIOPLASTY ILLIAC ARTERY Right 04/23/2021   Procedure: ANGIOPLASTY AND STENT SUPERFICIAL FEMORAL ARTERY;  Surgeon: Cherre Robins, MD;  Location: [place];  Service: Vascular;  Laterality: Right;   AORTOGRAM  04/23/2021   Procedure: AORTOGRAM;  Surgeon: Cherre Robins, MD;  Location: [place];  Service: Vascular;;   AORTOGRAM Left 04/30/2021    Procedure: left lower extremity angiogram with second order cannulation;  Surgeon: Cherre Robins, MD;  Location: [place];  Service: Vascular;  Laterality: Left;   BIOPSY  11/12/2020   Procedure: BIOPSY;  Surgeon: Harvel Quale, MD;  [facility];  Service: Gastroenterology;;   BREAST BIOPSY     CARDIAC CATHETERIZATION N/A 07/28/2016   Procedure: Left Heart Cath and Coronary Angiography;  Surgeon: Jettie Booze, MD;  Location: [place] [facility];  Service: Cardiovascular;  Laterality: N/A;   COLONOSCOPY N/A 05/10/2019   Procedure: COLONOSCOPY;  Surgeon: Danie Binder, MD;  [facility];  Service: Endoscopy;  Laterality: N/A;  Phenergan 12.5 mg IV in pre-op   DILATION AND CURETTAGE OF UTERUS     ESOPHAGOGASTRODUODENOSCOPY (EGD) WITH PROPOFOL N/A 11/12/2020   Procedure: ESOPHAGOGASTRODUODENOSCOPY (EGD) WITH PROPOFOL;  Surgeon: Harvel Quale, MD;  [facility];  Service: Gastroenterology;  Laterality: N/A;   I & D EXTREMITY Bilateral 09/22/2017   Procedure: BILATERAL DEBRIDEMENT LEG/FOOT ULCERS, APPLY VERAFLO WOUND VAC;  Surgeon: Newt Minion, MD;  Location: [place];  Service: Orthopedics;  Laterality: Bilateral;   I & D EXTREMITY Right 10/11/2018   Procedure: IRRIGATION AND DEBRIDEMENT RIGHT HAND;  Surgeon: [name], MD;  Location: [place];  Service: Orthopedics;  Laterality: Right;   I & D EXTREMITY Right 10/13/2018   Procedure: REPEAT IRRIGATION AND DEBRIDEMENT RIGHT HAND;  Surgeon: [name], MD;  Location: [place];  Service: Orthopedics;  Laterality: Right;   I & D EXTREMITY Right 11/22/2018   Procedure: IRRIGATION AND DEBRIDEMENT AND PINNING RIGHT HAND;  Surgeon: [name], MD;  Location: [place];  Service: Orthopedics;  Laterality: Right;   IR RADIOLOGIST EVAL & MGMT  07/05/2018   LOWER EXTREMITY ANGIOGRAM Bilateral 04/23/2021   Procedure: RIGHT  AND LEFT LOWER EXTREMITY ANGIOGRAM;  Surgeon: Cherre Robins, MD;   Location: [place];  Service: Vascular;  Laterality: Bilateral;   POLYPECTOMY  05/10/2019   Procedure: POLYPECTOMY;  Surgeon: Danie Binder, MD;  [facility];  Service: Endoscopy;;   SKIN SPLIT GRAFT Bilateral 09/28/2017   Procedure: BILATERAL SPLIT THICKNESS SKIN GRAFT LEGS/FEET AND APPLY VAC;  Surgeon: Newt Minion, MD;  Location: [place];  Service: Orthopedics;  Laterality: Bilateral;   SKIN SPLIT GRAFT Right 11/22/2018   Procedure: SKIN GRAFT SPLIT THICKNESS;  Surgeon: [name], MD;  Location: [place];  Service: Orthopedics;  Laterality: Right;   Family History:  Family History  Problem Relation Age of Onset   Hypertension Mother    Heart attack Mother    Hypertension Father        CABG    Hypertension Sister    Hypertension Brother    Hypertension Sister    Cancer Sister        breast    Arthritis Other    Cancer Other  Diabetes Other    Asthma Other    Hypertension Daughter    Hypertension Son    Family Psychiatric  History: None reported Social History:  Social History   Substance and Sexual Activity  Alcohol Use Not Currently   Alcohol/week: 0.0 standard drinks   Comment: Quit in 2017     Social History   Substance and Sexual Activity  Drug Use Not Currently   Frequency: 3.0 times per week   Types: Marijuana, Cocaine   Comment: none since August 2018    Social History   Socioeconomic History   Marital status: Widowed    Spouse name: Not on file   Number of children: 2   Years of education: 9   Highest education level: 9th grade  Occupational History   Occupation: Disabled  Tobacco Use   Smoking status: Some Days    Packs/day: 0.25    Years: 20.00    Pack years: 5.00    Types: Cigarettes    Last attempt to quit: 06/03/2017    Years since quitting: 3.9   Smokeless tobacco: Never  Vaping Use   Vaping Use: Never used  Substance and Sexual Activity   Alcohol use: Not Currently    Alcohol/week: 0.0 standard drinks    Comment: Quit in  2017   Drug use: Not Currently    Frequency: 3.0 times per week    Types: Marijuana, Cocaine    Comment: none since August 2018   Sexual activity: Not on file  Other Topics Concern   Not on file  Social History Narrative   Right handed   Drinks 1-2 cups caffeine daily   Social Determinants of Health   Financial Resource Strain: Medium Risk   Difficulty of Paying Living Expenses: Somewhat hard  Food Insecurity: Food Insecurity Present   Worried About Charity fundraiser in the Last Year: Sometimes true   Ran Out of Food in the Last Year: Sometimes true  Transportation Needs: No Transportation Needs   Lack of Transportation (Medical): No   Lack of Transportation (Non-Medical): No  Physical Activity: Insufficiently Active   Days of Exercise per Week: 7 days   Minutes of Exercise per Session: 20 min  Stress: Stress Concern Present   Feeling of Stress : To some extent  Social Connections: Moderately Isolated   Frequency of Communication with Friends and Family: More than three times a week   Frequency of Social Gatherings with Friends and Family: More than three times a week   Attends Religious Services: More than 4 times per year   Active Member of [employer] or Organizations: No   Attends Archivist Meetings: Never   Marital Status: Widowed   Additional Social History:    Allergies:  No Known Allergies  Labs:  Results for orders placed or performed during the hospital encounter of 04/21/21 (from the past 48 hour(s))  Glucose, capillary     Status: None   Collection Time: 05/12/21 11:21 AM  Result Value Ref Range   Glucose-Capillary 90 70 - 99 mg/dL    Comment: Glucose reference range applies only to samples taken after fasting for at least 8 hours.  Glucose, capillary     Status: Abnormal   Collection Time: 05/12/21  4:46 PM  Result Value Ref Range   Glucose-Capillary 129 (H) 70 - 99 mg/dL    Comment: Glucose reference range applies only to samples taken after  fasting for at least 8 hours.  Glucose, capillary  Status: Abnormal   Collection Time: 05/12/21  9:18 PM  Result Value Ref Range   Glucose-Capillary 113 (H) 70 - 99 mg/dL    Comment: Glucose reference range applies only to samples taken after fasting for at least 8 hours.  Glucose, capillary     Status: None   Collection Time: 05/13/21  4:23 AM  Result Value Ref Range   Glucose-Capillary 99 70 - 99 mg/dL    Comment: Glucose reference range applies only to samples taken after fasting for at least 8 hours.  CBC with Differential/Platelet     Status: Abnormal   Collection Time: 05/13/21  9:00 AM  Result Value Ref Range   WBC 10.9 (H) 4.0 - 10.5 K/uL   RBC 3.06 (L) 3.87 - 5.11 MIL/uL   Hemoglobin 8.6 (L) 12.0 - 15.0 g/dL   HCT 29.3 (L) 36.0 - 46.0 %   MCV 95.8 80.0 - 100.0 fL   MCH 28.1 26.0 - 34.0 pg   MCHC 29.4 (L) 30.0 - 36.0 g/dL   RDW 16.6 (H) 11.5 - 15.5 %   Platelets 319 150 - 400 K/uL   nRBC 0.0 0.0 - 0.2 %   Neutrophils Relative % 83 %   Neutro Abs 9.1 (H) 1.7 - 7.7 K/uL   Lymphocytes Relative 9 %   Lymphs Abs 1.0 0.7 - 4.0 K/uL   Monocytes Relative 4 %   Monocytes Absolute 0.5 0.1 - 1.0 K/uL   Eosinophils Relative 2 %   Eosinophils Absolute 0.2 0.0 - 0.5 K/uL   Basophils Relative 1 %   Basophils Absolute 0.1 0.0 - 0.1 K/uL   Immature Granulocytes 1 %   Abs Immature Granulocytes 0.06 0.00 - 0.07 K/uL    Comment: Performed at [facility], [address], [place], [place] [address]  Magnesium     Status: None   Collection Time: 05/13/21  9:00 AM  Result Value Ref Range   Magnesium 1.7 1.7 - 2.4 mg/dL    Comment: Performed at [facility], [place] [address] [place], [place] [address]  Phosphorus     Status: None   Collection Time: 05/13/21  9:00 AM  Result Value Ref Range   Phosphorus 3.7 2.5 - 4.6 mg/dL    Comment: Performed at [facility], [place] [address], [place], [place] [address]  Basic metabolic panel     Status: Abnormal   Collection Time:  05/13/21  9:00 AM  Result Value Ref Range   Sodium 141 135 - 145 mmol/L   Potassium 3.6 3.5 - 5.1 mmol/L   Chloride 111 98 - 111 mmol/L   CO2 21 (L) 22 - 32 mmol/L   Glucose, Bld 135 (H) 70 - 99 mg/dL    Comment: Glucose reference range applies only to samples taken after fasting for at least 8 hours.   BUN 8 6 - 20 mg/dL   Creatinine, Ser 1.15 (H) 0.44 - 1.00 mg/dL   Calcium 8.6 (L) 8.9 - 10.3 mg/dL   GFR, Estimated 56 (L) >60 mL/min    Comment: (NOTE) Calculated using the CKD-EPI Creatinine Equation (2021)    Anion gap 9 5 - 15    Comment: Performed at [facility] [address] [place], [place] [address]  Glucose, capillary     Status: Abnormal   Collection Time: 05/13/21 11:09 AM  Result Value Ref Range   Glucose-Capillary 141 (H) 70 - 99 mg/dL    Comment: Glucose reference range applies  only to samples taken after fasting for at least 8 hours.  Glucose, capillary     Status: Abnormal   Collection Time: 05/13/21  5:57 PM  Result Value Ref Range   Glucose-Capillary 192 (H) 70 - 99 mg/dL    Comment: Glucose reference range applies only to samples taken after fasting for at least 8 hours.  Glucose, capillary     Status: Abnormal   Collection Time: 05/13/21  9:07 PM  Result Value Ref Range   Glucose-Capillary 143 (H) 70 - 99 mg/dL    Comment: Glucose reference range applies only to samples taken after fasting for at least 8 hours.  CBC with Differential/Platelet     Status: Abnormal   Collection Time: 05/14/21  5:06 AM  Result Value Ref Range   WBC 9.3 4.0 - 10.5 K/uL   RBC 3.02 (L) 3.87 - 5.11 MIL/uL   Hemoglobin 8.7 (L) 12.0 - 15.0 g/dL   HCT 28.2 (L) 36.0 - 46.0 %   MCV 93.4 80.0 - 100.0 fL   MCH 28.8 26.0 - 34.0 pg   MCHC 30.9 30.0 - 36.0 g/dL   RDW 16.5 (H) 11.5 - 15.5 %   Platelets 300 150 - 400 K/uL   nRBC 0.0 0.0 - 0.2 %   Neutrophils Relative % 73 %   Neutro Abs 6.8 1.7 - 7.7 K/uL   Lymphocytes Relative 16 %   Lymphs Abs 1.5 0.7 - 4.0 K/uL    Monocytes Relative 7 %   Monocytes Absolute 0.6 0.1 - 1.0 K/uL   Eosinophils Relative 3 %   Eosinophils Absolute 0.3 0.0 - 0.5 K/uL   Basophils Relative 1 %   Basophils Absolute 0.1 0.0 - 0.1 K/uL   Immature Granulocytes 0 %   Abs Immature Granulocytes 0.03 0.00 - 0.07 K/uL    Comment: Performed at [facility], [address], [place], [place] [address]  Magnesium     Status: Abnormal   Collection Time: 05/14/21  5:06 AM  Result Value Ref Range   Magnesium 1.5 (L) 1.7 - 2.4 mg/dL    Comment: Performed at [facility] [address], [place], [place] [address]  Phosphorus     Status: None   Collection Time: 05/14/21  5:06 AM  Result Value Ref Range   Phosphorus 3.3 2.5 - 4.6 mg/dL    Comment: Performed at [facility] [address] [place], [place] [address]  Basic metabolic panel     Status: Abnormal   Collection Time: 05/14/21  5:06 AM  Result Value Ref Range   Sodium 141 135 - 145 mmol/L   Potassium 3.3 (L) 3.5 - 5.1 mmol/L   Chloride 109 98 - 111 mmol/L   CO2 22 22 - 32 mmol/L   Glucose, Bld 149 (H) 70 - 99 mg/dL    Comment: Glucose reference range applies only to samples taken after fasting for at least 8 hours.   BUN 6 6 - 20 mg/dL   Creatinine, Ser 1.13 (H) 0.44 - 1.00 mg/dL   Calcium 8.5 (L) 8.9 - 10.3 mg/dL   GFR, Estimated 57 (L) >60 mL/min    Comment: (NOTE) Calculated using the CKD-EPI Creatinine Equation (2021)    Anion gap 10 5 - 15    Comment: Performed at [facility] [address], [place], [place] [address]  Glucose, capillary     Status: Abnormal   Collection Time: 05/14/21  7:38 AM  Result Value Ref  Range   Glucose-Capillary 133 (H) 70 - 99 mg/dL    Comment: Glucose reference range applies only to samples taken after fasting for at least 8 hours.   *Note: Due to a large number of results and/or encounters for the requested time period, some results have not been displayed. A complete set of results can be found in Results  Review.    Current Facility-Administered Medications  Medication Dose Route Frequency Provider Last Rate Last Admin   acetaminophen (TYLENOL) tablet 650 mg  650 mg Oral Q6H PRN Johnson, Clanford L, MD   650 mg at 05/10/21 1005   Or   acetaminophen (TYLENOL) suppository 650 mg  650 mg Rectal Q6H PRN Johnson, Clanford L, MD       amLODipine (NORVASC) tablet 10 mg  10 mg Oral Daily Johnson, Clanford L, MD   10 mg at 05/13/21 0858   apixaban (ELIQUIS) tablet 5 mg  5 mg Oral BID Edwin Dada, MD   5 mg at 05/13/21 2111   aspirin EC tablet 81 mg  81 mg Oral Daily Dagoberto Ligas, PA-C   81 mg at 05/13/21 0858   benztropine (COGENTIN) tablet 0.5 mg  0.5 mg Oral BID Damita Dunnings B, MD   0.5 mg at 05/13/21 2112   Chlorhexidine Gluconate Cloth 2 % PADS 6 each  6 each Topical Daily Allie Bossier, MD   6 each at 05/12/21 0942   feeding supplement (ENSURE ENLIVE / ENSURE PLUS) liquid 237 mL  237 mL Oral BID BM Dwyane Dee, MD   237 mL at 05/13/21 1530   FLUoxetine (PROZAC) capsule 20 mg  20 mg Oral Daily Damita Dunnings B, MD   20 mg at 05/13/21 0857   haloperidol (HALDOL) tablet 5 mg  5 mg Oral TID Damita Dunnings B, MD   5 mg at 05/13/21 2111   hydrocortisone (ANUSOL-HC) 2.5 % rectal cream   Rectal BID Allie Bossier, MD   Given at 05/13/21 0900   insulin aspart (novoLOG) injection 0-5 Units  0-5 Units Subcutaneous QHS Johnson, Clanford L, MD       insulin aspart (novoLOG) injection 0-9 Units  0-9 Units Subcutaneous TID WC Johnson, Clanford L, MD   1 Units at 05/14/21 0851   insulin aspart (novoLOG) injection 3 Units  3 Units Subcutaneous TID WC Johnson, Clanford L, MD   3 Units at 05/11/21 1151   lactated ringers infusion   Intravenous Continuous Annye Asa, MD 10 mL/hr at 04/30/21 1255 Restarted at 04/30/21 1430   levETIRAcetam (KEPPRA) tablet 750 mg  750 mg Oral BID Wynetta Emery, Clanford L, MD   750 mg at 05/13/21 2111   lip balm (CARMEX) ointment   Topical PRN Cristal Ford, DO        LORazepam (ATIVAN) injection 2 mg  2 mg Intravenous Q6H PRN Allie Bossier, MD   2 mg at 05/11/21 2051   metoprolol tartrate (LOPRESSOR) tablet 50 mg  50 mg Oral BID Wynetta Emery, Clanford L, MD   50 mg at 05/13/21 2111   midazolam (VERSED) injection 2 mg  2 mg Intravenous Once Allie Bossier, MD       morphine 2 MG/ML injection 2 mg  2 mg Intravenous Q4H PRN Cristal Ford, DO   2 mg at 05/10/21 2247   multivitamin with minerals tablet 1 tablet  1 tablet Oral Daily Dwyane Dee, MD       ondansetron [facility]) tablet 4 mg  4 mg Oral Q6H PRN Wynetta Emery,  Clanford L, MD       Or   ondansetron (ZOFRAN) injection 4 mg  4 mg Intravenous Q6H PRN Johnson, Clanford L, MD       oxybutynin (DITROPAN-XL) 24 hr tablet 5 mg  5 mg Oral QHS Johnson, Clanford L, MD   5 mg at 05/13/21 2111   oxyCODONE (Oxy IR/ROXICODONE) immediate release tablet 5-10 mg  5-10 mg Oral Q6H PRN Cristal Ford, DO   10 mg at 05/12/21 1545   pantoprazole (PROTONIX) EC tablet 40 mg  40 mg Oral Daily Johnson, Clanford L, MD   40 mg at 05/13/21 0857   rosuvastatin (CRESTOR) tablet 10 mg  10 mg Oral Daily Johnson, Clanford L, MD   10 mg at 05/13/21 0858   sodium chloride flush (NS) 0.9 % injection 10-40 mL  10-40 mL Intracatheter PRN Allie Bossier, MD   10 mL at 05/06/21 0254   traZODone (DESYREL) tablet 50 mg  50 mg Oral QHS Johnson, Clanford L, MD   50 mg at 05/13/21 2111   umeclidinium bromide (INCRUSE ELLIPTA) 62.5 MCG/INH 1 puff  1 puff Inhalation Daily Danford, Suann Larry, MD       zinc sulfate capsule 220 mg  220 mg Oral Daily Johnson, Clanford L, MD   220 mg at 05/13/21 2706    Musculoskeletal: Strength & Muscle Tone: decreased and atrophy Gait & Station:  remains in bed on exam Patient leans: N/A            Psychiatric Specialty Exam:  Presentation  General Appearance: Appropriate for Environment (uncovers her face for the exam and then puts the blanket back over her near the end)  Eye  Contact:None  Speech:Slurred (missing teeth)  Speech Volume:Normal (but muffled by blanket)  Handedness: No data recorded  Mood and Affect  Mood:Euthymic  Affect:Blunt   Thought Process  Thought Processes:Coherent  Descriptions of Associations:Intact  Orientation:Partial  Thought Content:Logical  History of Schizophrenia/Schizoaffective disorder:No data recorded Duration of Psychotic Symptoms:No data recorded Hallucinations:Hallucinations: None Description of Auditory Hallucinations: adamantly denies  Ideas of Reference:None  Suicidal Thoughts:Suicidal Thoughts: No  Homicidal Thoughts:Homicidal Thoughts: No   Sensorium  Memory:Immediate Fair; Recent Fair; Remote Poor  Judgment:-- (Improving)  Insight:Shallow   Executive Functions  Concentration:Fair  Attention Span:Fair  Recall:Poor  Fund of Knowledge:Poor  Language:Poor   Psychomotor Activity  Psychomotor Activity:Psychomotor Activity: Psychomotor Retardation   Assets  Assets:Resilience; Social Support   Sleep  Sleep:Sleep: Good   Physical Exam: Physical Exam HENT:     Head: Normocephalic and atraumatic.  Eyes:     Extraocular Movements: Extraocular movements intact.     Conjunctiva/sclera: Conjunctivae normal.  Cardiovascular:     Rate and Rhythm: Normal rate.  Pulmonary:     Effort: Pulmonary effort is normal.     Breath sounds: Normal breath sounds.  Abdominal:     General: Abdomen is flat.  Musculoskeletal:        General: Tenderness present.     Comments: Edema in Bil feet  Skin:    General: Skin is warm and dry.  Neurological:     Mental Status: She is alert. Mental status is at baseline.   Review of Systems  Constitutional:  Negative for chills and fever.  HENT:  Negative for hearing loss.   Eyes:  Negative for blurred vision.  Respiratory:  Negative for cough and wheezing.   Cardiovascular:  Negative for chest pain.  Gastrointestinal:  Negative for abdominal  pain.  Musculoskeletal:  Positive for  myalgias.  Neurological:  Negative for dizziness.  Psychiatric/Behavioral:  Negative for suicidal ideas.   Blood pressure 110/83, pulse 80, temperature 98.7 F (37.1 C), temperature source Oral, resp. rate 18, height 5\' 1"  (1.549 m), weight 74.7 kg, last menstrual period 05/19/2016, SpO2 90 %. Body mass index is 31.12 kg/m.  Treatment Plan Summary: Plan follow patient every other day.  MDD,recurrent, severe, w/ psychosis Traits of Schizoid personality Patient continues to show some improvement in mood. Patient has a hx of being forthcoming with providers if she is having hallucinations and has not had any other recorded incidents of AVH since starting antipsychotic medication regimen. At this time will not adjust. It is likely that patient may have been a bit altered being awoken early in the AM and was also upset. Patient appears more responsive to treatment intervention after 8am. Patient also appears to compromise and allow treatment after this time. Patient noted cravings for nicotine and reported smoking up to 6 cigs/ day. - Recommend nicotine patch 7mg . - Recommend scheduling treatments after 10am - Patient has a POA and has been determined to NOT HAVE CAPACITY to make her own medical decisions. Therefore must ensure that patient is receiving treatment despite her refusal, per primary. - Recommend chaplain consult, to help patient adjust - Recommend EKG  - Continue Prozac 20mg  - Continue Haldol 5mg  PO TID - Continue cogentin 0.5mg  BID - Will continue to follow patient Disposition: No evidence of imminent risk to self or others at present.    PGY-2 Freida Busman, MD 05/14/2021 9:18 AM

## 2021-05-14 NOTE — Progress Notes (Signed)
Pt refuses blood sugar check this morning.

## 2021-05-15 DIAGNOSIS — A419 Sepsis, unspecified organism: Secondary | ICD-10-CM | POA: Diagnosis not present

## 2021-05-15 DIAGNOSIS — F333 Major depressive disorder, recurrent, severe with psychotic symptoms: Secondary | ICD-10-CM | POA: Diagnosis not present

## 2021-05-15 DIAGNOSIS — Z789 Other specified health status: Secondary | ICD-10-CM | POA: Diagnosis not present

## 2021-05-15 LAB — CBC WITH DIFFERENTIAL/PLATELET
Abs Immature Granulocytes: 0.03 10*3/uL (ref 0.00–0.07)
Basophils Absolute: 0 10*3/uL (ref 0.0–0.1)
Basophils Relative: 1 %
Eosinophils Absolute: 0.3 10*3/uL (ref 0.0–0.5)
Eosinophils Relative: 5 %
HCT: 27.1 % — ABNORMAL LOW (ref 36.0–46.0)
Hemoglobin: 8.2 g/dL — ABNORMAL LOW (ref 12.0–15.0)
Immature Granulocytes: 0 %
Lymphocytes Relative: 19 %
Lymphs Abs: 1.4 10*3/uL (ref 0.7–4.0)
MCH: 28.3 pg (ref 26.0–34.0)
MCHC: 30.3 g/dL (ref 30.0–36.0)
MCV: 93.4 fL (ref 80.0–100.0)
Monocytes Absolute: 0.6 10*3/uL (ref 0.1–1.0)
Monocytes Relative: 8 %
Neutro Abs: 5.3 10*3/uL (ref 1.7–7.7)
Neutrophils Relative %: 67 %
Platelets: 279 10*3/uL (ref 150–400)
RBC: 2.9 MIL/uL — ABNORMAL LOW (ref 3.87–5.11)
RDW: 16.5 % — ABNORMAL HIGH (ref 11.5–15.5)
WBC: 7.6 10*3/uL (ref 4.0–10.5)
nRBC: 0 % (ref 0.0–0.2)

## 2021-05-15 LAB — GLUCOSE, CAPILLARY
Glucose-Capillary: 188 mg/dL — ABNORMAL HIGH (ref 70–99)
Glucose-Capillary: 300 mg/dL — ABNORMAL HIGH (ref 70–99)
Glucose-Capillary: 188 mg/dL — ABNORMAL HIGH (ref 70–99)
Glucose-Capillary: 93 mg/dL (ref 70–99)
Glucose-Capillary: 92 mg/dL (ref 70–99)

## 2021-05-15 LAB — BASIC METABOLIC PANEL
Anion gap: 5 (ref 5–15)
BUN: 6 mg/dL (ref 6–20)
CO2: 26 mmol/L (ref 22–32)
Calcium: 8.4 mg/dL — ABNORMAL LOW (ref 8.9–10.3)
Chloride: 112 mmol/L — ABNORMAL HIGH (ref 98–111)
Creatinine, Ser: 0.94 mg/dL (ref 0.44–1.00)
GFR, Estimated: >60 mL/min (ref >60)
Glucose, Bld: 151 mg/dL — ABNORMAL HIGH (ref 70–99)
Potassium: 3.5 mmol/L (ref 3.5–5.1)
Sodium: 143 mmol/L (ref 135–145)

## 2021-05-15 LAB — MAGNESIUM: Magnesium: 1.9 mg/dL (ref 1.7–2.4)

## 2021-05-15 LAB — PHOSPHORUS: Phosphorus: 2.8 mg/dL (ref 2.5–4.6)

## 2021-05-15 MED ORDER — POTASSIUM CHLORIDE CRYS ER 20 MEQ PO TBCR: 40.0000 meq | EXTENDED_RELEASE_TABLET | Freq: Once | ORAL | Status: DC

## 2021-05-15 MED ORDER — POTASSIUM CHLORIDE CRYS ER 20 MEQ PO TBCR
40.0000 meq | EXTENDED_RELEASE_TABLET | Freq: Once | ORAL | Status: AC
  Administered 2021-05-15: 40 meq via ORAL
  Filled 2021-05-15: qty 2

## 2021-05-15 MED ORDER — MAGNESIUM SULFATE IN D5W 1-5 GM/100ML-% IV SOLN
1.0000 g | Freq: Once | INTRAVENOUS | Status: AC
  Administered 2021-05-15: 1 g via INTRAVENOUS
  Filled 2021-05-15: qty 100

## 2021-05-15 MED ORDER — K PHOS MONO-SOD PHOS DI & MONO 155-852-130 MG PO TABS
500.0000 mg | ORAL_TABLET | Freq: Once | ORAL | Status: AC
  Administered 2021-05-15: 500 mg via ORAL
  Filled 2021-05-15: qty 2

## 2021-05-15 MED ORDER — POTASSIUM CHLORIDE CRYS ER 20 MEQ PO TBCR
20.0000 meq | EXTENDED_RELEASE_TABLET | Freq: Once | ORAL | Status: AC
  Administered 2021-05-15: 20 meq via ORAL
  Filled 2021-05-15: qty 1

## 2021-05-15 NOTE — Plan of Care (Addendum)
Patient refused dressing changes x2, patient educated on needing dressing changes, patient still refused. Will continue to encourage. Son called and was give update that there were no changes.  Changed patient dressings @1800 , patient did not want them changed. Patient received bath as well. Patient can be verbally abusive and threatened to kick, punch, and hit staff multiple times.   Problem: Education: Goal: Knowledge of General Education information will improve Description: Including pain rating scale, medication(s)/side effects and non-pharmacologic comfort measures Outcome: Progressing   Problem: Activity: Goal: Risk for activity intolerance will decrease Outcome: Progressing   Problem: Pain Managment: Goal: General experience of comfort will improve Outcome: Progressing   Problem: Safety: Goal: Ability to remain free from injury will improve Outcome: Progressing   Problem: Skin Integrity: Goal: Risk for impaired skin integrity will decrease Outcome: Progressing

## 2021-05-15 NOTE — Progress Notes (Signed)
Patient is fully alert this morning and seems to be in a good mood. She let the IV team changed her PICC line dressing however pleasantly declined wound care to her BLE. Pt asked for pain medicine and tylenol 650 mg was given. Pt did request and drink some Ensure vanilla.

## 2021-05-15 NOTE — Progress Notes (Signed)
Progress Note    LANEE CHAIN   ZTI:[id number]  DOB: 12/20/1965  DOA: 04/21/2021     24  PCP: Caprice Renshaw, MD  CC: leg pain  Hospital Course: Ms. Pettijohn is a 55 yo female with PMH atrial fib on Eliquis, seizure disorder, tobacco abuse, COPD, PVD, HTN, DMII, dCHF, CKD, medical noncompliance, lack of capacity who presented with B/L leg pain from her nursing facility.  After work-up, she was ultimately found to have bilateral lower extremity leg ulcers with cellulitis, dry gangrene, and staph bacteremia. She was also evaluated by psychiatry during hospitalization and found to have lack of capacity due to inability to explain her reasoning for decision-making as well as lack of ability to explain reasoning for declining medical interventions including wound dressing changes.  Interval History:  Allowed IV team to change PICC line dressing but declined dressing change around 6 AM this morning.  Nursing staff to reattempt dressing change later today.   ROS: Review of systems not obtained due to patient factors. Cognitive impairment   Assessment & Plan: * Sepsis (HCC)-resolved as of 05/13/2021 - LE gangrene as source - now s/p stent and dressing changes  Patient incapable of making informed decisions - See psychiatry evaluation.  Patient very clearly lacks decision-making capacity in regards to her care and disposition - If ongoing refusal of dressing changes and/or meds, will have to likely force treatment given lack of capacity. I've discussed this personally with [name] who is in agreement if necessary/needed. 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 Stanford Breed and left lower extremity angiogram with second order cannulation  7/6 -intermittently refusing care; see decision making capacity; essentially patient does not have ability/capacity to refuse meds and care/dressing changes. If this continues, her daughter and son technically are shared decision makers and BOTH will need to be updated if major changes in care and/or consent is needed (this has been discussed at length with risk management and hospital accreditation liaison)  - psych also in agreement that patient cannot refuse care due to lack of capacity; if patient refuses, would first reattempt later in the day but if necessary, then temporary restraints may need to be utilized while performing dressing changes, etc  Atrial fibrillation with RVR ([place]) - Continue Eliquis and metoprolol  Acute renal failure superimposed on stage 3b chronic kidney disease (HCC)-resolved as of 05/13/2021 - patient has history of CKD3b. Baseline creat ~ 1.2, eGFR 35 - 39 - patient presents with increase in creat >0.3 mg/dL above baseline presumed to have occurred within past 7 days PTA - resolved with IVF   Seizure disorder ([place]) - Continue Keppra  MDD (major depressive disorder), recurrent, severe, with psychosis ([place]) - Continue Haldol, Cogentin, fluoxetine - psychiatry following, appreciate assistance - will obtain intermittent EKGs per rec's while on Haldol (QTc 472 on 7/20)  Bacteremia due to Staphylococcus-resolved as of 05/13/2021 -this was likely in the setting of multiple LE ischemic ulcers. ID consulted and followed patient, she was  maintained on antibiotics which have finished on 7/14 -remains afebrile, monitor off antibiotics  GERD (gastroesophageal reflux disease) - Continue  Protonix  Chronic respiratory failure with hypoxia (HCC) - Continue chronic O2  Pressure injury of skin Perineum stage I pressure injury, present on arrival Coccyx right, stage II pressure injury, present on admission  COPD (chronic obstructive pulmonary disease) (HCC) - No signs/symptoms of exacerbation  Urinary incontinence - Continue oxybutynin  Medical non-compliance - See lack of decision-making  Insomnia - Continue trazodone  DMII (diabetes mellitus, type 2) ([place]) - continue SSI and CBGs  Bleeding hemorrhoids-resolved as of 05/13/2021 - Topical hydrocortisone  Hypomagnesemia-resolved as of 05/13/2021 - Repleted as needed  Hypokalemia-resolved as of 05/13/2021 - Repleted as needed   Old records reviewed in assessment of this patient  Antimicrobials:   DVT prophylaxis: apixaban (ELIQUIS) tablet 5 mg   Code Status:   Code Status: Full Code Family Communication:   Disposition Plan: Status is: Inpatient  Remains inpatient appropriate because:Unsafe d/c plan  Dispo: The patient is from: Home              Anticipated d/c is to:  pending placement               Patient currently is medically stable to d/c.   Difficult to place patient Yes  Risk of unplanned readmission score: Unplanned Admission- Pilot do not use: 78.44   Objective: Blood pressure 127/84, pulse 85, temperature 97.9 F (36.6 C), temperature source Oral, resp. rate 17, height '5\' 1"'  (1.549 m), weight 74.7 kg, last menstrual period 05/19/2016, SpO2 93 %.  Examination: General appearance: alert and no distress Head: Normocephalic, without obvious abnormality, atraumatic Eyes:  EOMI Lungs: clear to auscultation bilaterally Heart: regular rate and rhythm and S1, S2 normal Abdomen: normal findings: bowel sounds normal and soft, non-tender Extremities:  LE dressings in place Skin: mobility and turgor normal Neurologic: followed commands, moves all 4 extremities  Consultants:   Psych  Procedures:    Data Reviewed: I have personally reviewed following labs and imaging studies Results for orders placed or performed during the hospital encounter of 04/21/21 (from the past 24 hour(s))  Glucose, capillary     Status: Abnormal   Collection Time: 05/14/21  3:49 PM  Result Value Ref Range   Glucose-Capillary 349 (H) 70 - 99 mg/dL  Glucose, capillary     Status: Abnormal   Collection Time: 05/14/21  9:01 PM  Result Value Ref Range   Glucose-Capillary 262 (H) 70 - 99 mg/dL  CBC with Differential/Platelet     Status: Abnormal   Collection Time: 05/15/21  5:00 AM  Result Value Ref Range   WBC 7.6 4.0 - 10.5 K/uL   RBC 2.90 (L) 3.87 - 5.11 MIL/uL   Hemoglobin 8.2 (L) 12.0 - 15.0 g/dL   HCT 27.1 (L) 36.0 - 46.0 %   MCV 93.4 80.0 - 100.0 fL   MCH 28.3 26.0 - 34.0 pg   MCHC 30.3 30.0 - 36.0 g/dL   RDW 16.5 (H) 11.5 - 15.5 %   Platelets 279 150 - 400 K/uL   nRBC 0.0 0.0 - 0.2 %   Neutrophils Relative % 67 %   Neutro Abs 5.3 1.7 - 7.7 K/uL   Lymphocytes Relative 19 %   Lymphs Abs 1.4 0.7 - 4.0 K/uL   Monocytes Relative 8 %   Monocytes Absolute 0.6 0.1 - 1.0 K/uL   Eosinophils Relative 5 %   Eosinophils Absolute 0.3 0.0 - 0.5 K/uL  Basophils Relative 1 %   Basophils Absolute 0.0 0.0 - 0.1 K/uL   Immature Granulocytes 0 %   Abs Immature Granulocytes 0.03 0.00 - 0.07 K/uL  Magnesium     Status: None   Collection Time: 05/15/21  5:00 AM  Result Value Ref Range   Magnesium 1.9 1.7 - 2.4 mg/dL  Phosphorus     Status: None   Collection Time: 05/15/21  5:00 AM  Result Value Ref Range   Phosphorus 2.8 2.5 - 4.6 mg/dL  Basic metabolic panel     Status: Abnormal   Collection Time: 05/15/21  5:00 AM  Result Value Ref Range   Sodium 143 135 - 145 mmol/L   Potassium 3.5 3.5 - 5.1 mmol/L   Chloride 112 (H) 98 - 111 mmol/L   CO2 26 22 - 32 mmol/L   Glucose, Bld 151 (H) 70 - 99 mg/dL   BUN 6 6 - 20 mg/dL   Creatinine, Ser 0.94 0.44 - 1.00 mg/dL   Calcium 8.4 (L)  8.9 - 10.3 mg/dL   GFR, Estimated >60 >60 mL/min   Anion gap 5 5 - 15  Glucose, capillary     Status: Abnormal   Collection Time: 05/15/21  6:59 AM  Result Value Ref Range   Glucose-Capillary 188 (H) 70 - 99 mg/dL  Glucose, capillary     Status: Abnormal   Collection Time: 05/15/21 11:46 AM  Result Value Ref Range   Glucose-Capillary 300 (H) 70 - 99 mg/dL   *Note: Due to a large number of results and/or encounters for the requested time period, some results have not been displayed. A complete set of results can be found in Results Review.    Recent Results (from the past 240 hour(s))  Culture, Urine     Status: Abnormal   Collection Time: 05/08/21  5:10 AM   Specimen: Urine, Random  Result Value Ref Range Status   Specimen Description URINE, RANDOM  Final   Special Requests   Final    NONE Performed at [facility], [address] [place], [place] [address]    Culture MULTIPLE SPECIES PRESENT, SUGGEST RECOLLECTION (A)  Final   Report Status 05/09/2021 FINAL  Final     Radiology Studies: No results found. [name] EKG SITE RITE  Final Result    HYBRID OR IMAGING ([facility] ONLY)  Final Result    [name] EKG SITE RITE  Final Result    HYBRID OR IMAGING ([facility] ONLY)  Final Result    [name] EKG SITE RITE  Final Result    DG Chest Port 1 View  Final Result      Scheduled Meds:  amLODipine  10 mg Oral Daily   apixaban  5 mg Oral BID   aspirin EC  81 mg Oral Daily   benztropine  0.5 mg Oral BID   Chlorhexidine Gluconate Cloth  6 each Topical Daily   feeding supplement  237 mL Oral BID BM   FLUoxetine  20 mg Oral Daily   haloperidol  5 mg Oral TID   hydrocortisone   Rectal BID   insulin aspart  0-5 Units Subcutaneous QHS   insulin aspart  0-9 Units Subcutaneous TID WC   insulin aspart  3 Units Subcutaneous TID WC   levETIRAcetam  750 mg Oral BID   metoprolol tartrate  50 mg Oral BID   multivitamin with minerals  1 tablet Oral Daily   nicotine  7 mg Transdermal Daily    oxybutynin  5 mg Oral QHS   pantoprazole  40 mg Oral Daily   potassium chloride  20 mEq Oral Once   rosuvastatin  10 mg Oral Daily   traZODone  50 mg Oral QHS   umeclidinium bromide  1 puff Inhalation Daily   zinc sulfate  220 mg Oral Daily   PRN Meds: acetaminophen **OR** acetaminophen, lip balm, LORazepam, morphine injection, ondansetron **OR** ondansetron (ZOFRAN) IV, oxyCODONE, sodium chloride flush Continuous Infusions:  lactated ringers 10 mL/hr at 04/30/21 1255     LOS: 24 days  Time spent: Greater than 50% of the 35 minute visit was spent in counseling/coordination of care for the patient as laid out in the A&P.   Dwyane Dee, MD [facility] 05/15/2021, 1:56 PM

## 2021-05-15 NOTE — Progress Notes (Signed)
Patient has been asleep most of the shift. Pt is easily arousable but then drifts back to sleep upon awakening. Notified J. Olena Heckle NP, of meds not being able to be given due to pt's LOC. Vitals WNL. Pt sats 100% on room air. Will continue to monitor pt.

## 2021-05-15 NOTE — Plan of Care (Signed)
  Problem: Nutrition: Goal: Adequate nutrition will be maintained Outcome: Progressing   Problem: Activity: Goal: Risk for activity intolerance will decrease Outcome: Progressing   Problem: Pain Managment: Goal: General experience of comfort will improve Outcome: Progressing   Problem: Safety: Goal: Ability to remain free from injury will improve Outcome: Progressing   Problem: Skin Integrity: Goal: Risk for impaired skin integrity will decrease Outcome: Progressing   Problem: Clinical Measurements: Goal: Respiratory complications will improve Outcome: Progressing   Problem: Clinical Measurements: Goal: Cardiovascular complication will be avoided Outcome: Progressing   Problem: Clinical Measurements: Goal: Ability to maintain clinical measurements within normal limits will improve Outcome: Progressing

## 2021-05-15 NOTE — TOC Progression Note (Signed)
Transition of Care [facility]) - Progression Note    Patient Details  Name: [name] MRN: [id number] Date of Birth: 24-Feb-1966  Transition of Care [facility]) CM/SW Contact  Milinda Antis, [place] Phone Number: 05/15/2021, 10:01 AM  Clinical Narrative:    08:37-  CSW spoke with [name] at [facility] and was informed that the facility does not have any beds available at this time due to a recent [place] outbreak.  [name] will inform CSW when the facility is able to accept the patient.         Expected Discharge Plan and Services                                                 Social Determinants of Health (SDOH) Interventions    Readmission Risk Interventions Readmission Risk Prevention Plan 10/31/2020 08/27/2020  Transportation Screening Complete Complete  Medication Review Press photographer) Complete Complete  PCP or Specialist appointment within 3-5 days of discharge - Complete  HRI or [place] - Complete  SW Recovery Care/Counseling Consult Complete Complete  Palliative Care Screening Not Applicable Not Applicable  Skilled Nursing Facility Complete Complete  Some recent data might be hidden

## 2021-05-15 NOTE — Consult Note (Signed)
[facility] Face-to-Face Psychiatry Consult   Reason for Consult:  Mood dysregulation Referring Physician:  Nolberto Hanlon, MD Patient Identification: [name] MRN:  [id number] Principal Diagnosis: Sepsis [facility]) Diagnosis:  Active Problems:   DMII (diabetes mellitus, type 2) ([place])   Essential hypertension   Insomnia   Medical non-compliance   Poor mobility   HTN (hypertension), malignant   Diabetic neuropathy ([place])   MDD (major depressive disorder), recurrent, severe, with psychosis ([place])   Urinary incontinence   COPD (chronic obstructive pulmonary disease) (HCC)   PAF (paroxysmal atrial fibrillation) (HCC)   Atrial fibrillation with RVR ([place])   Pressure injury of skin   Seizure disorder ([place])   Sacral ulcer, limited to breakdown of skin ([place])   Multiple duodenal ulcers   Chronic respiratory failure with hypoxia ([place])   Atherosclerosis of native artery of both legs with gangrene ([place])   Patient incapable of making informed decisions   GERD (gastroesophageal reflux disease)   Total Time spent with patient: 30 minutes  Subjective:   [name] is a 55 y.o. female patient admitted with  bilateral leg pain w/ PMH of depression, tobacco use disorder, severe PVD, paroxysmal aticoagulant, HLD, HTN, uncontrolled T2DM, CHF, CKD.  HPI:  On assessment this AM patient is up and talking to staff and is very animated joking with everyone. Patient is compliant with her medications. Per night nurse patient was willing to allow her PICC line be flushed but refused to have her leg wraps bandages changed. Patient continued to refuse this AM reporting, "You can't tell me what to do!" Provder mentioned that her daughter would want her mother to be cleaned patient reported, " She is not my mother!" Patient reported she may allow her bandages to be clean later, but did not want it at the time provider was in room. Otherwise patient reported she was hungry and enjoyed her Ensure and was also hoping to  have coffee. Patient reported that her mood was "better today than yesterday" and she continues to hope that she will get better. Patient denies SI, HI, and AVH.  Past Psychiatric History: Reports OP psych 4-5 visits total. Also report being dx with "Clinical depression" Recalls being on haldol for depression. Recalls taking Paxil and Prozac in the past.   Of note patient was not taking her Keppra prior to hospitalization despite having a seizure in 07/2020.   Risk to Self:  NO Risk to Others:  NO Prior Inpatient Therapy:  NO Prior Outpatient Therapy:  YES  Past Medical History:  Past Medical History:  Diagnosis Date   Alcohol use    Ankle fracture, lateral malleolus, closed 2013   Anxiety    Aortic atherosclerosis ([place]) 03/10/2021   Breast mass, left 2013   CHF (congestive heart failure) ([place])    a. EF 20-25% by echo in 05/2016 with cath showing normal cors b. EF 50-55% in 07/2020 c. 01/2021: EF at 55-60% with moderate LVH   Chronic anemia    CKD (chronic kidney disease), stage III (HCC)    Cocaine abuse (HCC)    COPD (chronic obstructive pulmonary disease) ([place])    Diabetes mellitus, type 2 (HCC)    Diabetic Charcot foot ([place])    Essential hypertension    History of cardiomyopathy    History of GI bleed 2011   Hyperlipidemia    Noncompliance    Obesity    PAF (paroxysmal atrial fibrillation) (HCC)    Panic attacks    PAT (paroxysmal atrial tachycardia) (  [place])    Previously on Amiodarone   Seizures ([place]) 10/01/2020   Sleep apnea    Not on CPAP   Stroke ([place]) 2018   Tobacco abuse    Urinary incontinence     Past Surgical History:  Procedure Laterality Date   ANGIOPLASTY ILLIAC ARTERY Right 04/23/2021   Procedure: ANGIOPLASTY AND STENT SUPERFICIAL FEMORAL ARTERY;  Surgeon: Cherre Robins, MD;  Location: [place];  Service: Vascular;  Laterality: Right;   AORTOGRAM  04/23/2021   Procedure: AORTOGRAM;  Surgeon: Cherre Robins, MD;  Location: [place];  Service: Vascular;;    AORTOGRAM Left 04/30/2021   Procedure: left lower extremity angiogram with second order cannulation;  Surgeon: Cherre Robins, MD;  Location: [place];  Service: Vascular;  Laterality: Left;   BIOPSY  11/12/2020   Procedure: BIOPSY;  Surgeon: Harvel Quale, MD;  [facility];  Service: Gastroenterology;;   BREAST BIOPSY     CARDIAC CATHETERIZATION N/A 07/28/2016   Procedure: Left Heart Cath and Coronary Angiography;  Surgeon: Jettie Booze, MD;  Location: [place] [facility];  Service: Cardiovascular;  Laterality: N/A;   COLONOSCOPY N/A 05/10/2019   Procedure: COLONOSCOPY;  Surgeon: Danie Binder, MD;  [facility];  Service: Endoscopy;  Laterality: N/A;  Phenergan 12.5 mg IV in pre-op   DILATION AND CURETTAGE OF UTERUS     ESOPHAGOGASTRODUODENOSCOPY (EGD) WITH PROPOFOL N/A 11/12/2020   Procedure: ESOPHAGOGASTRODUODENOSCOPY (EGD) WITH PROPOFOL;  Surgeon: Harvel Quale, MD;  [facility];  Service: Gastroenterology;  Laterality: N/A;   I & D EXTREMITY Bilateral 09/22/2017   Procedure: BILATERAL DEBRIDEMENT LEG/FOOT ULCERS, APPLY VERAFLO WOUND VAC;  Surgeon: Newt Minion, MD;  Location: [place];  Service: Orthopedics;  Laterality: Bilateral;   I & D EXTREMITY Right 10/11/2018   Procedure: IRRIGATION AND DEBRIDEMENT RIGHT HAND;  Surgeon: [name], MD;  Location: [place];  Service: Orthopedics;  Laterality: Right;   I & D EXTREMITY Right 10/13/2018   Procedure: REPEAT IRRIGATION AND DEBRIDEMENT RIGHT HAND;  Surgeon: [name], MD;  Location: [place];  Service: Orthopedics;  Laterality: Right;   I & D EXTREMITY Right 11/22/2018   Procedure: IRRIGATION AND DEBRIDEMENT AND PINNING RIGHT HAND;  Surgeon: [name], MD;  Location: [place];  Service: Orthopedics;  Laterality: Right;   IR RADIOLOGIST EVAL & MGMT  07/05/2018   LOWER EXTREMITY ANGIOGRAM Bilateral 04/23/2021   Procedure: RIGHT  AND LEFT LOWER EXTREMITY ANGIOGRAM;  Surgeon:  Cherre Robins, MD;  Location: [place];  Service: Vascular;  Laterality: Bilateral;   POLYPECTOMY  05/10/2019   Procedure: POLYPECTOMY;  Surgeon: Danie Binder, MD;  [facility];  Service: Endoscopy;;   SKIN SPLIT GRAFT Bilateral 09/28/2017   Procedure: BILATERAL SPLIT THICKNESS SKIN GRAFT LEGS/FEET AND APPLY VAC;  Surgeon: Newt Minion, MD;  Location: [place];  Service: Orthopedics;  Laterality: Bilateral;   SKIN SPLIT GRAFT Right 11/22/2018   Procedure: SKIN GRAFT SPLIT THICKNESS;  Surgeon: [name], MD;  Location: [place];  Service: Orthopedics;  Laterality: Right;   Family History:  Family History  Problem Relation Age of Onset   Hypertension Mother    Heart attack Mother    Hypertension Father        CABG    Hypertension Sister    Hypertension Brother    Hypertension Sister    Cancer Sister        breast    Arthritis Other  Cancer Other    Diabetes Other    Asthma Other    Hypertension Daughter    Hypertension Son    Family Psychiatric  History: None known Social History:  Social History   Substance and Sexual Activity  Alcohol Use Not Currently   Alcohol/week: 0.0 standard drinks   Comment: Quit in 2017     Social History   Substance and Sexual Activity  Drug Use Not Currently   Frequency: 3.0 times per week   Types: Marijuana, Cocaine   Comment: none since August 2018    Social History   Socioeconomic History   Marital status: Widowed    Spouse name: Not on file   Number of children: 2   Years of education: 9   Highest education level: 9th grade  Occupational History   Occupation: Disabled  Tobacco Use   Smoking status: Some Days    Packs/day: 0.25    Years: 20.00    Pack years: 5.00    Types: Cigarettes    Last attempt to quit: 06/03/2017    Years since quitting: 3.9   Smokeless tobacco: Never  Vaping Use   Vaping Use: Never used  Substance and Sexual Activity   Alcohol use: Not Currently    Alcohol/week: 0.0 standard drinks     Comment: Quit in 2017   Drug use: Not Currently    Frequency: 3.0 times per week    Types: Marijuana, Cocaine    Comment: none since August 2018   Sexual activity: Not on file  Other Topics Concern   Not on file  Social History Narrative   Right handed   Drinks 1-2 cups caffeine daily   Social Determinants of Health   Financial Resource Strain: Medium Risk   Difficulty of Paying Living Expenses: Somewhat hard  Food Insecurity: Food Insecurity Present   Worried About Charity fundraiser in the Last Year: Sometimes true   Ran Out of Food in the Last Year: Sometimes true  Transportation Needs: No Transportation Needs   Lack of Transportation (Medical): No   Lack of Transportation (Non-Medical): No  Physical Activity: Insufficiently Active   Days of Exercise per Week: 7 days   Minutes of Exercise per Session: 20 min  Stress: Stress Concern Present   Feeling of Stress : To some extent  Social Connections: Moderately Isolated   Frequency of Communication with Friends and Family: More than three times a week   Frequency of Social Gatherings with Friends and Family: More than three times a week   Attends Religious Services: More than 4 times per year   Active Member of [employer] or Organizations: No   Attends Archivist Meetings: Never   Marital Status: Widowed   Additional Social History:    Allergies:  No Known Allergies  Labs:  Results for orders placed or performed during the hospital encounter of 04/21/21 (from the past 48 hour(s))  Glucose, capillary     Status: Abnormal   Collection Time: 05/13/21 11:09 AM  Result Value Ref Range   Glucose-Capillary 141 (H) 70 - 99 mg/dL    Comment: Glucose reference range applies only to samples taken after fasting for at least 8 hours.  Glucose, capillary     Status: Abnormal   Collection Time: 05/13/21  5:57 PM  Result Value Ref Range   Glucose-Capillary 192 (H) 70 - 99 mg/dL    Comment: Glucose reference range applies  only to samples taken after fasting for at least 8 hours.  Glucose, capillary     Status: Abnormal   Collection Time: 05/13/21  9:07 PM  Result Value Ref Range   Glucose-Capillary 143 (H) 70 - 99 mg/dL    Comment: Glucose reference range applies only to samples taken after fasting for at least 8 hours.  CBC with Differential/Platelet     Status: Abnormal   Collection Time: 05/14/21  5:06 AM  Result Value Ref Range   WBC 9.3 4.0 - 10.5 K/uL   RBC 3.02 (L) 3.87 - 5.11 MIL/uL   Hemoglobin 8.7 (L) 12.0 - 15.0 g/dL   HCT 28.2 (L) 36.0 - 46.0 %   MCV 93.4 80.0 - 100.0 fL   MCH 28.8 26.0 - 34.0 pg   MCHC 30.9 30.0 - 36.0 g/dL   RDW 16.5 (H) 11.5 - 15.5 %   Platelets 300 150 - 400 K/uL   nRBC 0.0 0.0 - 0.2 %   Neutrophils Relative % 73 %   Neutro Abs 6.8 1.7 - 7.7 K/uL   Lymphocytes Relative 16 %   Lymphs Abs 1.5 0.7 - 4.0 K/uL   Monocytes Relative 7 %   Monocytes Absolute 0.6 0.1 - 1.0 K/uL   Eosinophils Relative 3 %   Eosinophils Absolute 0.3 0.0 - 0.5 K/uL   Basophils Relative 1 %   Basophils Absolute 0.1 0.0 - 0.1 K/uL   Immature Granulocytes 0 %   Abs Immature Granulocytes 0.03 0.00 - 0.07 K/uL    Comment: Performed at [facility], [address], [place], [place] [address]  Magnesium     Status: Abnormal   Collection Time: 05/14/21  5:06 AM  Result Value Ref Range   Magnesium 1.5 (L) 1.7 - 2.4 mg/dL    Comment: Performed at [facility] [address] [place], [place] [address]  Phosphorus     Status: None   Collection Time: 05/14/21  5:06 AM  Result Value Ref Range   Phosphorus 3.3 2.5 - 4.6 mg/dL    Comment: Performed at [facility] [address], [place], [place] [address]  Basic metabolic panel     Status: Abnormal   Collection Time: 05/14/21  5:06 AM  Result Value Ref Range   Sodium 141 135 - 145 mmol/L   Potassium 3.3 (L) 3.5 - 5.1 mmol/L   Chloride 109 98 - 111 mmol/L   CO2 22 22 - 32 mmol/L   Glucose, Bld 149 (H) 70 - 99 mg/dL     Comment: Glucose reference range applies only to samples taken after fasting for at least 8 hours.   BUN 6 6 - 20 mg/dL   Creatinine, Ser 1.13 (H) 0.44 - 1.00 mg/dL   Calcium 8.5 (L) 8.9 - 10.3 mg/dL   GFR, Estimated 57 (L) >60 mL/min    Comment: (NOTE) Calculated using the CKD-EPI Creatinine Equation (2021)    Anion gap 10 5 - 15    Comment: Performed at [facility] [address] [place], [place] [address]  Glucose, capillary     Status: Abnormal   Collection Time: 05/14/21  7:38 AM  Result Value Ref Range   Glucose-Capillary 133 (H) 70 - 99 mg/dL    Comment: Glucose reference range applies only to samples taken after fasting for at least 8 hours.  Glucose, capillary     Status: Abnormal   Collection Time: 05/14/21 11:37 AM  Result Value Ref Range   Glucose-Capillary 190 (H) 70 - 99 mg/dL  Comment: Glucose reference range applies only to samples taken after fasting for at least 8 hours.  Glucose, capillary     Status: Abnormal   Collection Time: 05/14/21  3:49 PM  Result Value Ref Range   Glucose-Capillary 349 (H) 70 - 99 mg/dL    Comment: Glucose reference range applies only to samples taken after fasting for at least 8 hours.  Glucose, capillary     Status: Abnormal   Collection Time: 05/14/21  9:01 PM  Result Value Ref Range   Glucose-Capillary 262 (H) 70 - 99 mg/dL    Comment: Glucose reference range applies only to samples taken after fasting for at least 8 hours.  CBC with Differential/Platelet     Status: Abnormal   Collection Time: 05/15/21  5:00 AM  Result Value Ref Range   WBC 7.6 4.0 - 10.5 K/uL   RBC 2.90 (L) 3.87 - 5.11 MIL/uL   Hemoglobin 8.2 (L) 12.0 - 15.0 g/dL   HCT 27.1 (L) 36.0 - 46.0 %   MCV 93.4 80.0 - 100.0 fL   MCH 28.3 26.0 - 34.0 pg   MCHC 30.3 30.0 - 36.0 g/dL   RDW 16.5 (H) 11.5 - 15.5 %   Platelets 279 150 - 400 K/uL   nRBC 0.0 0.0 - 0.2 %   Neutrophils Relative % 67 %   Neutro Abs 5.3 1.7 - 7.7 K/uL   Lymphocytes Relative 19 %    Lymphs Abs 1.4 0.7 - 4.0 K/uL   Monocytes Relative 8 %   Monocytes Absolute 0.6 0.1 - 1.0 K/uL   Eosinophils Relative 5 %   Eosinophils Absolute 0.3 0.0 - 0.5 K/uL   Basophils Relative 1 %   Basophils Absolute 0.0 0.0 - 0.1 K/uL   Immature Granulocytes 0 %   Abs Immature Granulocytes 0.03 0.00 - 0.07 K/uL    Comment: Performed at [facility], [address], [place], [place] [address]  Magnesium     Status: None   Collection Time: 05/15/21  5:00 AM  Result Value Ref Range   Magnesium 1.9 1.7 - 2.4 mg/dL    Comment: Performed at [facility], [place] [address], [place], [place] [address]  Phosphorus     Status: None   Collection Time: 05/15/21  5:00 AM  Result Value Ref Range   Phosphorus 2.8 2.5 - 4.6 mg/dL    Comment: Performed at [facility] [address], [place], [place] [address]  Basic metabolic panel     Status: Abnormal   Collection Time: 05/15/21  5:00 AM  Result Value Ref Range   Sodium 143 135 - 145 mmol/L   Potassium 3.5 3.5 - 5.1 mmol/L   Chloride 112 (H) 98 - 111 mmol/L   CO2 26 22 - 32 mmol/L   Glucose, Bld 151 (H) 70 - 99 mg/dL    Comment: Glucose reference range applies only to samples taken after fasting for at least 8 hours.   BUN 6 6 - 20 mg/dL   Creatinine, Ser 0.94 0.44 - 1.00 mg/dL   Calcium 8.4 (L) 8.9 - 10.3 mg/dL   GFR, Estimated >60 >60 mL/min    Comment: (NOTE) Calculated using the CKD-EPI Creatinine Equation (2021)    Anion gap 5 5 - 15    Comment: Performed at [facility] [address], [place], [place] [address]  Glucose, capillary     Status: Abnormal   Collection Time: 05/15/21  6:59 AM  Result  Value Ref Range   Glucose-Capillary 188 (H) 70 - 99 mg/dL    Comment: Glucose reference range applies only to samples taken after fasting for at least 8 hours.   *Note: Due to a large number of results and/or encounters for the requested time period, some results have not been displayed. A complete set of results can be  found in Results Review.    Current Facility-Administered Medications  Medication Dose Route Frequency Provider Last Rate Last Admin   acetaminophen (TYLENOL) tablet 650 mg  650 mg Oral Q6H PRN Johnson, Clanford L, MD   650 mg at 05/15/21 0535   Or   acetaminophen (TYLENOL) suppository 650 mg  650 mg Rectal Q6H PRN Johnson, Clanford L, MD       amLODipine (NORVASC) tablet 10 mg  10 mg Oral Daily Johnson, Clanford L, MD   10 mg at 05/15/21 0826   apixaban (ELIQUIS) tablet 5 mg  5 mg Oral BID Edwin Dada, MD   5 mg at 05/15/21 5329   aspirin EC tablet 81 mg  81 mg Oral Daily Dagoberto Ligas, PA-C   81 mg at 05/15/21 9242   benztropine (COGENTIN) tablet 0.5 mg  0.5 mg Oral BID Damita Dunnings B, MD   0.5 mg at 05/15/21 0820   Chlorhexidine Gluconate Cloth 2 % PADS 6 each  6 each Topical Daily Allie Bossier, MD   6 each at 05/15/21 6834   feeding supplement (ENSURE ENLIVE / ENSURE PLUS) liquid 237 mL  237 mL Oral BID BM Dwyane Dee, MD   237 mL at 05/15/21 0823   FLUoxetine (PROZAC) capsule 20 mg  20 mg Oral Daily Damita Dunnings B, MD   20 mg at 05/15/21 0819   haloperidol (HALDOL) tablet 5 mg  5 mg Oral TID Damita Dunnings B, MD   5 mg at 05/15/21 1962   hydrocortisone (ANUSOL-HC) 2.5 % rectal cream   Rectal BID Allie Bossier, MD   1 application at 22/97/98 0826   insulin aspart (novoLOG) injection 0-5 Units  0-5 Units Subcutaneous QHS Johnson, Clanford L, MD   3 Units at 05/14/21 2215   insulin aspart (novoLOG) injection 0-9 Units  0-9 Units Subcutaneous TID WC Johnson, Clanford L, MD   2 Units at 05/15/21 0824   insulin aspart (novoLOG) injection 3 Units  3 Units Subcutaneous TID WC Wynetta Emery, Clanford L, MD   3 Units at 05/15/21 9211   lactated ringers infusion   Intravenous Continuous Annye Asa, MD 10 mL/hr at 04/30/21 1255 Restarted at 04/30/21 1430   levETIRAcetam (KEPPRA) tablet 750 mg  750 mg Oral BID Johnson, Clanford L, MD   750 mg at 05/14/21 1010   lip balm (CARMEX)  ointment   Topical PRN Cristal Ford, DO       LORazepam (ATIVAN) injection 2 mg  2 mg Intravenous Q6H PRN Allie Bossier, MD   2 mg at 05/11/21 2051   metoprolol tartrate (LOPRESSOR) tablet 50 mg  50 mg Oral BID Wynetta Emery, Clanford L, MD   50 mg at 05/15/21 9417   midazolam (VERSED) injection 2 mg  2 mg Intravenous Once Allie Bossier, MD       morphine 2 MG/ML injection 2 mg  2 mg Intravenous Q4H PRN Cristal Ford, DO   2 mg at 05/10/21 2247   multivitamin with minerals tablet 1 tablet  1 tablet Oral Daily Dwyane Dee, MD   1 tablet at 05/15/21 0819   nicotine (NICODERM CQ -  dosed in mg/24 hr) patch 7 mg  7 mg Transdermal Daily Dwyane Dee, MD   7 mg at 05/15/21 0828   ondansetron (ZOFRAN) tablet 4 mg  4 mg Oral Q6H PRN Johnson, Clanford L, MD       Or   ondansetron (ZOFRAN) injection 4 mg  4 mg Intravenous Q6H PRN Johnson, Clanford L, MD       oxybutynin (DITROPAN-XL) 24 hr tablet 5 mg  5 mg Oral QHS Johnson, Clanford L, MD   5 mg at 05/13/21 2111   oxyCODONE (Oxy IR/ROXICODONE) immediate release tablet 5-10 mg  5-10 mg Oral Q6H PRN Cristal Ford, DO   10 mg at 05/14/21 1433   pantoprazole (PROTONIX) EC tablet 40 mg  40 mg Oral Daily Johnson, Clanford L, MD   40 mg at 05/15/21 0819   rosuvastatin (CRESTOR) tablet 10 mg  10 mg Oral Daily Johnson, Clanford L, MD   10 mg at 05/15/21 9675   sodium chloride flush (NS) 0.9 % injection 10-40 mL  10-40 mL Intracatheter PRN Allie Bossier, MD   10 mL at 05/06/21 9163   traZODone (DESYREL) tablet 50 mg  50 mg Oral QHS Johnson, Clanford L, MD   50 mg at 05/13/21 2111   umeclidinium bromide (INCRUSE ELLIPTA) 62.5 MCG/INH 1 puff  1 puff Inhalation Daily Danford, Suann Larry, MD       zinc sulfate capsule 220 mg  220 mg Oral Daily Johnson, Clanford L, MD   220 mg at 05/15/21 8466    Musculoskeletal: Strength & Muscle Tone: atrophy Gait & Station:  remains in bed on exam Patient leans: N/A            Psychiatric Specialty  Exam:  Presentation  General Appearance: Appropriate for Environment  Eye Contact:Good  Speech:Slurred (no teeth)  Speech Volume:Normal  Handedness: No data recorded  Mood and Affect  Mood:Labile  Affect:Congruent   Thought Process  Thought Processes:Linear  Descriptions of Associations:Intact  Orientation:Partial (to place, person, and situation. not year. this is baseline)  Thought Content:Logical  History of Schizophrenia/Schizoaffective disorder:No data recorded Duration of Psychotic Symptoms:No data recorded Hallucinations:Hallucinations: None Description of Auditory Hallucinations: adamantly denies  Ideas of Reference:None  Suicidal Thoughts:Suicidal Thoughts: No  Homicidal Thoughts:Homicidal Thoughts: No   Sensorium  Memory:Immediate Good; Recent Fair; Remote Fair  Judgment:Impaired  Insight:Shallow   Executive Functions  Concentration:Fair  Attention Span:Fair  Recall:Poor  Fund of Knowledge:Poor  Language:Fair   Psychomotor Activity  Psychomotor Activity:Psychomotor Activity: Normal   Assets  Assets:Social Support; Resilience   Sleep  Sleep:Sleep: Good   Physical Exam: Physical Exam Constitutional:      Appearance: Normal appearance.  HENT:     Head: Normocephalic and atraumatic.  Eyes:     Extraocular Movements: Extraocular movements intact.     Conjunctiva/sclera: Conjunctivae normal.  Cardiovascular:     Rate and Rhythm: Normal rate.  Pulmonary:     Effort: Pulmonary effort is normal.     Breath sounds: Normal breath sounds.  Abdominal:     General: Abdomen is flat.  Musculoskeletal:        General: No deformity.  Skin:    General: Skin is warm and dry.  Neurological:     Mental Status: She is alert. Mental status is at baseline.   Review of Systems  Constitutional:  Negative for chills and fever.  HENT:  Negative for hearing loss.   Eyes:  Negative for blurred vision.  Respiratory:  Negative for cough and  wheezing.   Cardiovascular:  Negative for chest pain.  Gastrointestinal:  Negative for abdominal pain.  Neurological:  Negative for dizziness.  Psychiatric/Behavioral:  Negative for hallucinations and suicidal ideas.   Blood pressure 127/84, pulse 85, temperature 97.9 F (36.6 C), temperature source Oral, resp. rate 17, height 5\' 1"  (1.549 m), weight 74.7 kg, last menstrual period 05/19/2016, SpO2 93 %. Body mass index is 31.12 kg/m.  Treatment Plan Summary: Daily contact with patient to assess and evaluate symptoms and progress in treatment MDD,recurrent, severe, w/ psychosis Traits of Schizoid personality  Patient continues to have more affect daily, less thought blocking, and interacting more with her care. Patient EKG noted Qtc of 472, will continue with current haldol regimen. Patient does seem to refuse treatments more early in the AM, but is still showing some effort to compromise.  - Continue nicotine patch 7mg . - Recommend scheduling treatments after 10am - Patient has a POA and has been determined to NOT HAVE CAPACITY to make her own medical decisions. Therefore must ensure that patient is receiving treatment despite her refusal, per primary. - Recommend chaplain consult, to help patient adjust to situation and patient appears to enjoy having someone to talk to as long as it is not early in the AM  - Continue Prozac 20mg  - Continue Haldol 5mg  PO TID - Continue cogentin 0.5mg  BID - Will continue to follow patient Disposition: Supportive therapy provided about ongoing stressors.  PGY-2 Freida Busman, MD 05/15/2021 9:50 AM

## 2021-05-16 DIAGNOSIS — F333 Major depressive disorder, recurrent, severe with psychotic symptoms: Secondary | ICD-10-CM | POA: Diagnosis not present

## 2021-05-16 DIAGNOSIS — F5105 Insomnia due to other mental disorder: Secondary | ICD-10-CM | POA: Diagnosis not present

## 2021-05-16 DIAGNOSIS — Z9119 Patient's noncompliance with other medical treatment and regimen: Secondary | ICD-10-CM | POA: Diagnosis not present

## 2021-05-16 DIAGNOSIS — F99 Mental disorder, not otherwise specified: Secondary | ICD-10-CM | POA: Diagnosis not present

## 2021-05-16 DIAGNOSIS — Z789 Other specified health status: Secondary | ICD-10-CM | POA: Diagnosis not present

## 2021-05-16 DIAGNOSIS — A419 Sepsis, unspecified organism: Secondary | ICD-10-CM | POA: Diagnosis not present

## 2021-05-16 LAB — GLUCOSE, CAPILLARY
Glucose-Capillary: 154 mg/dL — ABNORMAL HIGH (ref 70–99)
Glucose-Capillary: 130 mg/dL — ABNORMAL HIGH (ref 70–99)
Glucose-Capillary: 239 mg/dL — ABNORMAL HIGH (ref 70–99)
Glucose-Capillary: 140 mg/dL — ABNORMAL HIGH (ref 70–99)

## 2021-05-16 NOTE — Progress Notes (Signed)
Arrived to patient's room to perform linecare. Patient flat out refused any kind of assessment and started cussing. Notified nurse. Fran Lowes, RN VAST

## 2021-05-16 NOTE — Progress Notes (Signed)
Patient refused blood draw.

## 2021-05-16 NOTE — TOC Progression Note (Signed)
Transition of Care [facility]) - Progression Note    Patient Details  Name: [name] MRN: [id number] Date of Birth: 05-Sep-1966  Transition of Care [facility]) CM/SW Contact  Milinda Antis, LCSWA Phone Number: 05/16/2021, 10:23 AM  Clinical Narrative:    09:21-  CSW spoke with [name] at [facility].  The facility does not have a bed available at this time.  No other facility has accepted the patient.  Pending:  Bed availability         Expected Discharge Plan and Services                                                 Social Determinants of Health (SDOH) Interventions    Readmission Risk Interventions Readmission Risk Prevention Plan 10/31/2020 08/27/2020  Transportation Screening Complete Complete  Medication Review Press photographer) Complete Complete  PCP or Specialist appointment within 3-5 days of discharge - Complete  HRI or [place] - Complete  SW Recovery Care/Counseling Consult Complete Complete  Palliative Care Screening Not Applicable Not Applicable  Skilled Nursing Facility Complete Complete  Some recent data might be hidden

## 2021-05-16 NOTE — Consult Note (Signed)
[facility] Face-to-Face Psychiatry Consult   Reason for Consult:  Mood dysregulation Referring Physician:  Nolberto Hanlon, MD Patient Identification: [name] MRN:  [id number] Principal Diagnosis: Sepsis [facility]) Diagnosis:  Active Problems:   DMII (diabetes mellitus, type 2) ([place])   Essential hypertension   Insomnia   Medical non-compliance   Poor mobility   HTN (hypertension), malignant   Diabetic neuropathy ([place])   MDD (major depressive disorder), recurrent, severe, with psychosis ([place])   Urinary incontinence   COPD (chronic obstructive pulmonary disease) (HCC)   PAF (paroxysmal atrial fibrillation) (HCC)   Atrial fibrillation with RVR ([place])   Pressure injury of skin   Seizure disorder ([place])   Sacral ulcer, limited to breakdown of skin ([place])   Multiple duodenal ulcers   Chronic respiratory failure with hypoxia ([place])   Atherosclerosis of native artery of both legs with gangrene ([place])   Patient incapable of making informed decisions   GERD (gastroesophageal reflux disease)   Total Time spent with patient: 20 minutes  Subjective:   [name] is a 55 y.o. female patient admitted with  bilateral leg pain w/ PMH of depression, tobacco use disorder, severe PVD, paroxysmal aticoagulant, HLD, HTN, uncontrolled T2DM, CHF, CKD.  HPI: Late afternoon yesterday patient was able to have her bandages changed and received a bath. On assessment today patient reports she wants a cigarette and does not care if she gets kicked out of the hospital to smoke. Patient reports her family came by yesterday. Patient does not endorse SI, HI, nor AVH. Patient requests a new gown.   Past Psychiatric History: Reports OP psych 4-5 visits total. Also report being dx with "Clinical depression" Recalls being on haldol for depression. Recalls taking Paxil and Prozac in the past.   Of note patient was not taking her Keppra prior to hospitalization despite having a seizure in 07/2020.   Risk to Self:  NO Risk  to Others:  NO Prior Inpatient Therapy:  NO Prior Outpatient Therapy:  YES  Past Medical History:  Past Medical History:  Diagnosis Date   Alcohol use    Ankle fracture, lateral malleolus, closed 2013   Anxiety    Aortic atherosclerosis ([place]) 03/10/2021   Breast mass, left 2013   CHF (congestive heart failure) ([place])    a. EF 20-25% by echo in 05/2016 with cath showing normal cors b. EF 50-55% in 07/2020 c. 01/2021: EF at 55-60% with moderate LVH   Chronic anemia    CKD (chronic kidney disease), stage III (HCC)    Cocaine abuse (HCC)    COPD (chronic obstructive pulmonary disease) ([place])    Diabetes mellitus, type 2 ([place])    Diabetic Charcot foot ([place])    Essential hypertension    History of cardiomyopathy    History of GI bleed 2011   Hyperlipidemia    Noncompliance    Obesity    PAF (paroxysmal atrial fibrillation) (HCC)    Panic attacks    PAT (paroxysmal atrial tachycardia) (HCC)    Previously on Amiodarone   Seizures ([place]) 10/01/2020   Sleep apnea    Not on CPAP   Stroke ([place]) 2018   Tobacco abuse    Urinary incontinence     Past Surgical History:  Procedure Laterality Date   ANGIOPLASTY ILLIAC ARTERY Right 04/23/2021   Procedure: ANGIOPLASTY AND STENT SUPERFICIAL FEMORAL ARTERY;  Surgeon: Cherre Robins, MD;  Location: [place];  Service: Vascular;  Laterality: Right;   AORTOGRAM  04/23/2021   Procedure: AORTOGRAM;  Surgeon: Cherre Robins, MD;  Location: [place];  Service: Vascular;;   AORTOGRAM Left 04/30/2021   Procedure: left lower extremity angiogram with second order cannulation;  Surgeon: Cherre Robins, MD;  Location: [place];  Service: Vascular;  Laterality: Left;   BIOPSY  11/12/2020   Procedure: BIOPSY;  Surgeon: Harvel Quale, MD;  [facility];  Service: Gastroenterology;;   BREAST BIOPSY     CARDIAC CATHETERIZATION N/A 07/28/2016   Procedure: Left Heart Cath and Coronary Angiography;  Surgeon: Jettie Booze, MD;  Location: [place] [facility];  Service: Cardiovascular;  Laterality: N/A;   COLONOSCOPY N/A 05/10/2019   Procedure: COLONOSCOPY;  Surgeon: Danie Binder, MD;  [facility];  Service: Endoscopy;  Laterality: N/A;  Phenergan 12.5 mg IV in pre-op   DILATION AND CURETTAGE OF UTERUS     ESOPHAGOGASTRODUODENOSCOPY (EGD) WITH PROPOFOL N/A 11/12/2020   Procedure: ESOPHAGOGASTRODUODENOSCOPY (EGD) WITH PROPOFOL;  Surgeon: Harvel Quale, MD;  [facility];  Service: Gastroenterology;  Laterality: N/A;   I & D EXTREMITY Bilateral 09/22/2017   Procedure: BILATERAL DEBRIDEMENT LEG/FOOT ULCERS, APPLY VERAFLO WOUND VAC;  Surgeon: Newt Minion, MD;  Location: [place];  Service: Orthopedics;  Laterality: Bilateral;   I & D EXTREMITY Right 10/11/2018   Procedure: IRRIGATION AND DEBRIDEMENT RIGHT HAND;  Surgeon: [name], MD;  Location: [place];  Service: Orthopedics;  Laterality: Right;   I & D EXTREMITY Right 10/13/2018   Procedure: REPEAT IRRIGATION AND DEBRIDEMENT RIGHT HAND;  Surgeon: [name], MD;  Location: [place];  Service: Orthopedics;  Laterality: Right;   I & D EXTREMITY Right 11/22/2018   Procedure: IRRIGATION AND DEBRIDEMENT AND PINNING RIGHT HAND;  Surgeon: [name], MD;  Location: [place];  Service: Orthopedics;  Laterality: Right;   IR RADIOLOGIST EVAL & MGMT  07/05/2018   LOWER EXTREMITY ANGIOGRAM Bilateral 04/23/2021   Procedure: RIGHT  AND LEFT LOWER EXTREMITY ANGIOGRAM;  Surgeon: Cherre Robins, MD;  Location: [place];  Service: Vascular;  Laterality: Bilateral;   POLYPECTOMY  05/10/2019   Procedure: POLYPECTOMY;  Surgeon: Danie Binder, MD;  [facility];  Service: Endoscopy;;   SKIN SPLIT GRAFT Bilateral 09/28/2017   Procedure: BILATERAL SPLIT THICKNESS SKIN GRAFT LEGS/FEET AND APPLY VAC;  Surgeon: Newt Minion, MD;  Location: [place];  Service: Orthopedics;  Laterality: Bilateral;   SKIN SPLIT GRAFT Right 11/22/2018   Procedure: SKIN GRAFT SPLIT  THICKNESS;  Surgeon: [name], MD;  Location: [place];  Service: Orthopedics;  Laterality: Right;   Family History:  Family History  Problem Relation Age of Onset   Hypertension Mother    Heart attack Mother    Hypertension Father        CABG    Hypertension Sister    Hypertension Brother    Hypertension Sister    Cancer Sister        breast    Arthritis Other    Cancer Other    Diabetes Other    Asthma Other    Hypertension Daughter    Hypertension Son    Family Psychiatric  History: None known Social History:  Social History   Substance and Sexual Activity  Alcohol Use Not Currently   Alcohol/week: 0.0 standard drinks   Comment: Quit in 2017     Social History   Substance and Sexual Activity  Drug Use Not Currently   Frequency: 3.0 times per week   Types:  Marijuana, Cocaine   Comment: none since August 2018    Social History   Socioeconomic History   Marital status: Widowed    Spouse name: Not on file   Number of children: 2   Years of education: 9   Highest education level: 9th grade  Occupational History   Occupation: Disabled  Tobacco Use   Smoking status: Some Days    Packs/day: 0.25    Years: 20.00    Pack years: 5.00    Types: Cigarettes    Last attempt to quit: 06/03/2017    Years since quitting: 3.9   Smokeless tobacco: Never  Vaping Use   Vaping Use: Never used  Substance and Sexual Activity   Alcohol use: Not Currently    Alcohol/week: 0.0 standard drinks    Comment: Quit in 2017   Drug use: Not Currently    Frequency: 3.0 times per week    Types: Marijuana, Cocaine    Comment: none since August 2018   Sexual activity: Not on file  Other Topics Concern   Not on file  Social History Narrative   Right handed   Drinks 1-2 cups caffeine daily   Social Determinants of Health   Financial Resource Strain: Medium Risk   Difficulty of Paying Living Expenses: Somewhat hard  Food Insecurity: Food Insecurity Present   Worried About  Charity fundraiser in the Last Year: Sometimes true   Ran Out of Food in the Last Year: Sometimes true  Transportation Needs: No Transportation Needs   Lack of Transportation (Medical): No   Lack of Transportation (Non-Medical): No  Physical Activity: Insufficiently Active   Days of Exercise per Week: 7 days   Minutes of Exercise per Session: 20 min  Stress: Stress Concern Present   Feeling of Stress : To some extent  Social Connections: Moderately Isolated   Frequency of Communication with Friends and Family: More than three times a week   Frequency of Social Gatherings with Friends and Family: More than three times a week   Attends Religious Services: More than 4 times per year   Active Member of [employer] or Organizations: No   Attends Archivist Meetings: Never   Marital Status: Widowed   Additional Social History:    Allergies:  No Known Allergies  Labs:  Results for orders placed or performed during the hospital encounter of 04/21/21 (from the past 48 hour(s))  Glucose, capillary     Status: Abnormal   Collection Time: 05/14/21 11:37 AM  Result Value Ref Range   Glucose-Capillary 190 (H) 70 - 99 mg/dL    Comment: Glucose reference range applies only to samples taken after fasting for at least 8 hours.  Glucose, capillary     Status: Abnormal   Collection Time: 05/14/21  3:49 PM  Result Value Ref Range   Glucose-Capillary 349 (H) 70 - 99 mg/dL    Comment: Glucose reference range applies only to samples taken after fasting for at least 8 hours.  Glucose, capillary     Status: Abnormal   Collection Time: 05/14/21  9:01 PM  Result Value Ref Range   Glucose-Capillary 262 (H) 70 - 99 mg/dL    Comment: Glucose reference range applies only to samples taken after fasting for at least 8 hours.  CBC with Differential/Platelet     Status: Abnormal   Collection Time: 05/15/21  5:00 AM  Result Value Ref Range   WBC 7.6 4.0 - 10.5 K/uL   RBC 2.90 (L) 3.87 -  5.11 MIL/uL    Hemoglobin 8.2 (L) 12.0 - 15.0 g/dL   HCT 27.1 (L) 36.0 - 46.0 %   MCV 93.4 80.0 - 100.0 fL   MCH 28.3 26.0 - 34.0 pg   MCHC 30.3 30.0 - 36.0 g/dL   RDW 16.5 (H) 11.5 - 15.5 %   Platelets 279 150 - 400 K/uL   nRBC 0.0 0.0 - 0.2 %   Neutrophils Relative % 67 %   Neutro Abs 5.3 1.7 - 7.7 K/uL   Lymphocytes Relative 19 %   Lymphs Abs 1.4 0.7 - 4.0 K/uL   Monocytes Relative 8 %   Monocytes Absolute 0.6 0.1 - 1.0 K/uL   Eosinophils Relative 5 %   Eosinophils Absolute 0.3 0.0 - 0.5 K/uL   Basophils Relative 1 %   Basophils Absolute 0.0 0.0 - 0.1 K/uL   Immature Granulocytes 0 %   Abs Immature Granulocytes 0.03 0.00 - 0.07 K/uL    Comment: Performed at [facility] [address], [place], [place] [address]  Magnesium     Status: None   Collection Time: 05/15/21  5:00 AM  Result Value Ref Range   Magnesium 1.9 1.7 - 2.4 mg/dL    Comment: Performed at [facility], [place] [address], [place], [place] [address]  Phosphorus     Status: None   Collection Time: 05/15/21  5:00 AM  Result Value Ref Range   Phosphorus 2.8 2.5 - 4.6 mg/dL    Comment: Performed at [facility] [address] [place], [place] [id number]  Basic metabolic panel     Status: Abnormal   Collection Time: 05/15/21  5:00 AM  Result Value Ref Range   Sodium 143 135 - 145 mmol/L   Potassium 3.5 3.5 - 5.1 mmol/L   Chloride 112 (H) 98 - 111 mmol/L   CO2 26 22 - 32 mmol/L   Glucose, Bld 151 (H) 70 - 99 mg/dL    Comment: Glucose reference range applies only to samples taken after fasting for at least 8 hours.   BUN 6 6 - 20 mg/dL   Creatinine, Ser 0.94 0.44 - 1.00 mg/dL   Calcium 8.4 (L) 8.9 - 10.3 mg/dL   GFR, Estimated >60 >60 mL/min    Comment: (NOTE) Calculated using the CKD-EPI Creatinine Equation (2021)    Anion gap 5 5 - 15    Comment: Performed at [facility] [address], [place], [place] [address]  Glucose, capillary     Status: Abnormal   Collection Time: 05/15/21  6:59 AM   Result Value Ref Range   Glucose-Capillary 188 (H) 70 - 99 mg/dL    Comment: Glucose reference range applies only to samples taken after fasting for at least 8 hours.  Glucose, capillary     Status: Abnormal   Collection Time: 05/15/21 11:46 AM  Result Value Ref Range   Glucose-Capillary 300 (H) 70 - 99 mg/dL    Comment: Glucose reference range applies only to samples taken after fasting for at least 8 hours.  Glucose, capillary     Status: Abnormal   Collection Time: 05/15/21  4:28 PM  Result Value Ref Range   Glucose-Capillary 188 (H) 70 - 99 mg/dL    Comment: Glucose reference range applies only to samples taken after fasting for at least 8 hours.  Glucose, capillary     Status: None   Collection Time: 05/15/21  9:18 PM  Result Value Ref Range  Glucose-Capillary 93 70 - 99 mg/dL    Comment: Glucose reference range applies only to samples taken after fasting for at least 8 hours.  Glucose, capillary     Status: None   Collection Time: 05/15/21  9:49 PM  Result Value Ref Range   Glucose-Capillary 92 70 - 99 mg/dL    Comment: Glucose reference range applies only to samples taken after fasting for at least 8 hours.  Glucose, capillary     Status: Abnormal   Collection Time: 05/16/21  3:50 AM  Result Value Ref Range   Glucose-Capillary 154 (H) 70 - 99 mg/dL    Comment: Glucose reference range applies only to samples taken after fasting for at least 8 hours.  Glucose, capillary     Status: Abnormal   Collection Time: 05/16/21  7:00 AM  Result Value Ref Range   Glucose-Capillary 130 (H) 70 - 99 mg/dL    Comment: Glucose reference range applies only to samples taken after fasting for at least 8 hours.   *Note: Due to a large number of results and/or encounters for the requested time period, some results have not been displayed. A complete set of results can be found in Results Review.    Current Facility-Administered Medications  Medication Dose Route Frequency Provider Last Rate  Last Admin   acetaminophen (TYLENOL) tablet 650 mg  650 mg Oral Q6H PRN Johnson, Clanford L, MD   650 mg at 05/15/21 0535   Or   acetaminophen (TYLENOL) suppository 650 mg  650 mg Rectal Q6H PRN Johnson, Clanford L, MD       amLODipine (NORVASC) tablet 10 mg  10 mg Oral Daily Johnson, Clanford L, MD   10 mg at 05/15/21 0826   apixaban (ELIQUIS) tablet 5 mg  5 mg Oral BID Edwin Dada, MD   5 mg at 05/15/21 2152   aspirin EC tablet 81 mg  81 mg Oral Daily Dagoberto Ligas, PA-C   81 mg at 05/15/21 [id number]   benztropine (COGENTIN) tablet 0.5 mg  0.5 mg Oral BID Damita Dunnings B, MD   0.5 mg at 05/15/21 2152   Chlorhexidine Gluconate Cloth 2 % PADS 6 each  6 each Topical Daily Allie Bossier, MD   6 each at 05/15/21 [id number]   feeding supplement (ENSURE ENLIVE / ENSURE PLUS) liquid 237 mL  237 mL Oral BID BM Dwyane Dee, MD   237 mL at 05/15/21 1629   FLUoxetine (PROZAC) capsule 20 mg  20 mg Oral Daily Damita Dunnings B, MD   20 mg at 05/15/21 0819   haloperidol (HALDOL) tablet 5 mg  5 mg Oral TID Damita Dunnings B, MD   5 mg at 05/15/21 2151   hydrocortisone (ANUSOL-HC) 2.5 % rectal cream   Rectal BID Allie Bossier, MD   1 application at [id number]   insulin aspart (novoLOG) injection 0-5 Units  0-5 Units Subcutaneous QHS Johnson, Clanford L, MD   3 Units at 05/14/21 2215   insulin aspart (novoLOG) injection 0-9 Units  0-9 Units Subcutaneous TID WC Johnson, Clanford L, MD   2 Units at 05/15/21 1633   insulin aspart (novoLOG) injection 3 Units  3 Units Subcutaneous TID WC Wynetta Emery, Clanford L, MD   3 Units at 05/15/21 1632   lactated ringers infusion   Intravenous Continuous Annye Asa, MD 10 mL/hr at 04/30/21 1255 Restarted at 04/30/21 1430   levETIRAcetam (KEPPRA) tablet 750 mg  750 mg Oral BID Murlean Iba, MD  750 mg at 05/15/21 2151   lip balm (CARMEX) ointment   Topical PRN Cristal Ford, DO       LORazepam (ATIVAN) injection 2 mg  2 mg Intravenous Q6H PRN Allie Bossier, MD   2 mg at 05/11/21 2051   metoprolol tartrate (LOPRESSOR) tablet 50 mg  50 mg Oral BID Wynetta Emery, Clanford L, MD   50 mg at 05/15/21 2152   morphine 2 MG/ML injection 2 mg  2 mg Intravenous Q4H PRN Cristal Ford, DO   2 mg at 05/10/21 2247   multivitamin with minerals tablet 1 tablet  1 tablet Oral Daily Dwyane Dee, MD   1 tablet at 05/15/21 [id number]   nicotine (NICODERM CQ - dosed in mg/24 hr) patch 7 mg  7 mg Transdermal Daily Dwyane Dee, MD   7 mg at 05/15/21 0828   ondansetron (ZOFRAN) tablet 4 mg  4 mg Oral Q6H PRN Johnson, Clanford L, MD       Or   ondansetron (ZOFRAN) injection 4 mg  4 mg Intravenous Q6H PRN Johnson, Clanford L, MD       oxybutynin (DITROPAN-XL) 24 hr tablet 5 mg  5 mg Oral QHS Johnson, Clanford L, MD   5 mg at 05/15/21 2152   oxyCODONE (Oxy IR/ROXICODONE) immediate release tablet 5-10 mg  5-10 mg Oral Q6H PRN Cristal Ford, DO   5 mg at 05/15/21 1813   pantoprazole (PROTONIX) EC tablet 40 mg  40 mg Oral Daily Johnson, Clanford L, MD   40 mg at 05/15/21 0819   rosuvastatin (CRESTOR) tablet 10 mg  10 mg Oral Daily Johnson, Clanford L, MD   10 mg at 05/15/21 [id number]   sodium chloride flush (NS) 0.9 % injection 10-40 mL  10-40 mL Intracatheter PRN Allie Bossier, MD   10 mL at 05/06/21 [id number]   traZODone (DESYREL) tablet 50 mg  50 mg Oral QHS Johnson, Clanford L, MD   50 mg at 05/15/21 2152   umeclidinium bromide (INCRUSE ELLIPTA) 62.5 MCG/INH 1 puff  1 puff Inhalation Daily Danford, Suann Larry, MD       zinc sulfate capsule 220 mg  220 mg Oral Daily Johnson, Clanford L, MD   220 mg at 05/15/21 [id number]    Musculoskeletal: Strength & Muscle Tone: decreased Gait & Station:  remains in bed on exam Patient leans: N/A            Psychiatric Specialty Exam:  Presentation  General Appearance: Bizarre (not wearing a gown, but asking for one)  Eye Contact:Good  Speech:Slurred (no teeth)  Speech Volume:Normal  Handedness: No data recorded  Mood and  Affect  Mood:Irritable  Affect:Congruent   Thought Process  Thought Processes:Coherent  Descriptions of Associations:Intact  Orientation:Partial  Thought Content:Logical  History of Schizophrenia/Schizoaffective disorder:No data recorded Duration of Psychotic Symptoms:No data recorded Hallucinations:Hallucinations: None  Ideas of Reference:None  Suicidal Thoughts:Suicidal Thoughts: No  Homicidal Thoughts:Homicidal Thoughts: No   Sensorium  Memory:Immediate Good; Recent Fair; Remote Fair  Judgment:Impaired  Insight:None   Executive Functions  Concentration:Fair  Attention Span:Fair  [place] of Knowledge:Poor  Language:Fair   Psychomotor Activity  Psychomotor Activity:Psychomotor Activity: Normal   Assets  Assets:Social Support   Sleep  Sleep:Sleep: Good   Physical Exam: Physical Exam Constitutional:      Appearance: Normal appearance.  HENT:     Head: Normocephalic and atraumatic.  Eyes:     Extraocular Movements: Extraocular movements intact.     Conjunctiva/sclera: Conjunctivae normal.  Cardiovascular:  Rate and Rhythm: Normal rate.  Pulmonary:     Effort: Pulmonary effort is normal.     Breath sounds: Normal breath sounds.  Abdominal:     General: Abdomen is flat.  Musculoskeletal:        General: No signs of injury.  Skin:    General: Skin is warm and dry.  Neurological:     Mental Status: She is alert. Mental status is at baseline.   Review of Systems  Constitutional:  Negative for chills and fever.  HENT:  Negative for hearing loss.   Eyes:  Negative for blurred vision.  Respiratory:  Negative for cough and wheezing.   Cardiovascular:  Negative for chest pain.  Gastrointestinal:  Negative for abdominal pain.  Neurological:  Negative for dizziness.  Psychiatric/Behavioral:  The patient does not have insomnia.        Craving nicotine   Blood pressure (!) 143/91, pulse 79, temperature 98.7 F (37.1 C),  temperature source Oral, resp. rate 11, height '5\' 1"'$  (1.549 m), weight 74.7 kg, last menstrual period 05/19/2016, SpO2 96 %. Body mass index is 31.12 kg/m.  Treatment Plan Summary: Daily contact with patient to assess and evaluate symptoms and progress in treatment MDD,recurrent, severe, w/ psychosis Traits of Schizoid personality Patient remains psych stable. She continues to have periods of refusal and being more irritable, but RN staff is able to compromise more with patient in order to get necessary treatments. Patient continues to be more engaged. Patient appears to appreciate consistency.  - Will see patient again on Monday. - Continue nicotine patch '7mg'$ , continue to tape over patches concern that patient is taking them off and putting them in her mouth - Recommend scheduling treatments after 10am - Patient has a POA and has been determined to NOT HAVE CAPACITY to make her own medical decisions. Therefore must ensure that patient is receiving treatment despite her refusal, per primary. - Recommend chaplain consult, to help patient adjust to situation and patient appears to enjoy having someone to talk to as long as it is not early in the AM  - Continue Prozac '20mg'$  - Continue Haldol '5mg'$  PO TID - Continue cogentin 0.'5mg'$  BID - Will continue to follow patient Disposition: Supportive therapy provided about ongoing stressors.  PGY-2 Freida Busman, MD 05/16/2021 9:18 AM

## 2021-05-16 NOTE — Progress Notes (Signed)
Progress Note    [name]   LKG:[id number]  DOB: 03-30-1966  DOA: 04/21/2021     23  PCP: Caprice Renshaw, MD  CC: leg pain  Hospital Course: [name] is a 55 yo female with PMH atrial fib on Eliquis, seizure disorder, tobacco abuse, COPD, PVD, HTN, DMII, dCHF, CKD, medical noncompliance, lack of capacity who presented with B/L leg pain from her nursing facility.  After work-up, she was ultimately found to have bilateral lower extremity leg ulcers with cellulitis, dry gangrene, and staph bacteremia. She was also evaluated by psychiatry during hospitalization and found to have lack of capacity due to inability to explain her reasoning for decision-making as well as lack of ability to explain reasoning for declining medical interventions including wound dressing changes.  Interval History:  No events overnight.  Still requires some stronger convincing from nursing staff throughout the day for allowing dressing changes and ongoing care.  Mood seems to be for the most part pleasant and cooperative.  ROS: Review of systems not obtained due to patient factors. Cognitive impairment   Assessment & Plan: * Sepsis (HCC)-resolved as of 05/13/2021 - LE gangrene as source - now s/p stent and dressing changes  Patient incapable of making informed decisions - See psychiatry evaluation.  Patient very clearly lacks decision-making capacity in regards to her care and disposition - If ongoing refusal of dressing changes and/or meds, will have to likely force treatment given lack of capacity. I've discussed this personally with [name] who is in agreement if necessary/needed. Future discussions will also be held with daughter [name]) and son [name], [phone])  - discharge placement pending a facility and bed found  - I called and spoke with [name] on 7/22 at 4pm. Update given, he was very thankful. Asked if there was anyway he could be allowed to come on campus again to visit  his mom to help be her support person as well; I informed him I would pass this request along but I did not know the answer at this time - I then called and spoke with [name] after his call; informed her of my communication with both of them; she did express concerns that he was known to bring "drugs" to his mom when she was at [facility] and that was her expressed concern if he visits patient in the hospital   Atherosclerosis of native artery of both legs with gangrene [facility]) -vascular and orthopedic surgery consulted, she is now s/p right superficial femoral / popliteal artery stenting on 6/29 by Dr. Stanford Breed and left lower extremity angiogram with second order cannulation  7/6 -intermittently refusing care; see decision making capacity; essentially patient does not have ability/capacity to refuse meds and care/dressing changes. If this continues, her daughter and son technically are shared decision makers and BOTH will need to be updated if major changes in care and/or consent is needed (this has been discussed at length with risk management and hospital accreditation liaison)  - psych also in agreement that patient cannot refuse care due to lack of capacity; if patient refuses, would first reattempt later in the day but if necessary, then temporary restraints may need to be utilized while performing dressing changes, etc  Atrial fibrillation with RVR ([place]) - Continue Eliquis and metoprolol  Acute renal failure superimposed on stage 3b chronic kidney disease (HCC)-resolved as of 05/13/2021 - patient has history of CKD3b. Baseline creat ~ 1.2, eGFR 35 - 39 - patient presents with increase in creat >0.3 mg/dL  above baseline presumed to have occurred within past 7 days PTA - resolved with IVF   Seizure disorder ([place]) - Continue Keppra  MDD (major depressive disorder), recurrent, severe, with psychosis ([place]) - Continue Haldol, Cogentin, fluoxetine - psychiatry following, appreciate assistance - will  obtain intermittent EKGs per rec's while on Haldol (QTc 472 on 7/20)  Bacteremia due to Staphylococcus-resolved as of 05/13/2021 -this was likely in the setting of multiple LE ischemic ulcers. ID consulted and followed patient, she was maintained on antibiotics which have finished on 7/14 -remains afebrile, monitor off antibiotics  GERD (gastroesophageal reflux disease) - Continue Protonix  Chronic respiratory failure with hypoxia (HCC) - Continue chronic O2  Pressure injury of skin Perineum stage I pressure injury, present on arrival Coccyx right, stage II pressure injury, present on admission  COPD (chronic obstructive pulmonary disease) (HCC) - No signs/symptoms of exacerbation  Urinary incontinence - Continue oxybutynin  Medical non-compliance - See lack of decision-making  Insomnia - Continue trazodone  DMII (diabetes mellitus, type 2) ([place]) - continue SSI and CBGs  Bleeding hemorrhoids-resolved as of 05/13/2021 - Topical hydrocortisone  Hypomagnesemia-resolved as of 05/13/2021 - Repleted as needed  Hypokalemia-resolved as of 05/13/2021 - Repleted as needed   Old records reviewed in assessment of this patient  Antimicrobials:   DVT prophylaxis: apixaban (ELIQUIS) tablet 5 mg   Code Status:   Code Status: Full Code Family Communication: son and daughter   Disposition Plan: Status is: Inpatient  Remains inpatient appropriate because:Unsafe d/c plan  Dispo: The patient is from: Home              Anticipated d/c is to:  pending placement               Patient currently is medically stable to d/c.   Difficult to place patient Yes  Risk of unplanned readmission score: Unplanned Admission- Pilot do not use: 78.57   Objective: Blood pressure (!) 143/91, pulse 79, temperature 98.7 F (37.1 C), temperature source Oral, resp. rate 11, height '5\' 1"'  (1.549 m), weight 74.7 kg, last menstrual period 05/19/2016, SpO2 96 %.  Examination: General appearance: alert  and no distress Head: Normocephalic, without obvious abnormality, atraumatic Eyes:  EOMI Lungs: clear to auscultation bilaterally Heart: regular rate and rhythm and S1, S2 normal Abdomen: normal findings: bowel sounds normal and soft, non-tender Extremities:  LE dressings in place Skin: mobility and turgor normal Neurologic: followed commands, moves all 4 extremities  Consultants:  Psych  Procedures:    Data Reviewed: I have personally reviewed following labs and imaging studies Results for orders placed or performed during the hospital encounter of 04/21/21 (from the past 24 hour(s))  Glucose, capillary     Status: Abnormal   Collection Time: 05/15/21  4:28 PM  Result Value Ref Range   Glucose-Capillary 188 (H) 70 - 99 mg/dL  Glucose, capillary     Status: None   Collection Time: 05/15/21  9:18 PM  Result Value Ref Range   Glucose-Capillary 93 70 - 99 mg/dL  Glucose, capillary     Status: None   Collection Time: 05/15/21  9:49 PM  Result Value Ref Range   Glucose-Capillary 92 70 - 99 mg/dL  Glucose, capillary     Status: Abnormal   Collection Time: 05/16/21  3:50 AM  Result Value Ref Range   Glucose-Capillary 154 (H) 70 - 99 mg/dL  Glucose, capillary     Status: Abnormal   Collection Time: 05/16/21  7:00 AM  Result Value Ref  Range   Glucose-Capillary 130 (H) 70 - 99 mg/dL  Glucose, capillary     Status: Abnormal   Collection Time: 05/16/21  1:34 PM  Result Value Ref Range   Glucose-Capillary 239 (H) 70 - 99 mg/dL   *Note: Due to a large number of results and/or encounters for the requested time period, some results have not been displayed. A complete set of results can be found in Results Review.    Recent Results (from the past 240 hour(s))  Culture, Urine     Status: Abnormal   Collection Time: 05/08/21  5:10 AM   Specimen: Urine, Random  Result Value Ref Range Status   Specimen Description URINE, RANDOM  Final   Special Requests   Final    NONE Performed at  [facility], [address], [place], [place] [address]    Culture MULTIPLE SPECIES PRESENT, SUGGEST RECOLLECTION (A)  Final   Report Status 05/09/2021 FINAL  Final     Radiology Studies: No results found. [name] EKG SITE RITE  Final Result    HYBRID OR IMAGING ([facility] ONLY)  Final Result    [name] EKG SITE RITE  Final Result    HYBRID OR IMAGING ([facility] ONLY)  Final Result    [name] EKG SITE RITE  Final Result    DG Chest Port 1 View  Final Result      Scheduled Meds:  amLODipine  10 mg Oral Daily   apixaban  5 mg Oral BID   aspirin EC  81 mg Oral Daily   benztropine  0.5 mg Oral BID   Chlorhexidine Gluconate Cloth  6 each Topical Daily   feeding supplement  237 mL Oral BID BM   FLUoxetine  20 mg Oral Daily   haloperidol  5 mg Oral TID   hydrocortisone   Rectal BID   insulin aspart  0-5 Units Subcutaneous QHS   insulin aspart  0-9 Units Subcutaneous TID WC   insulin aspart  3 Units Subcutaneous TID WC   levETIRAcetam  750 mg Oral BID   metoprolol tartrate  50 mg Oral BID   multivitamin with minerals  1 tablet Oral Daily   nicotine  7 mg Transdermal Daily   oxybutynin  5 mg Oral QHS   pantoprazole  40 mg Oral Daily   rosuvastatin  10 mg Oral Daily   traZODone  50 mg Oral QHS   umeclidinium bromide  1 puff Inhalation Daily   zinc sulfate  220 mg Oral Daily   PRN Meds: acetaminophen **OR** acetaminophen, lip balm, LORazepam, morphine injection, ondansetron **OR** ondansetron (ZOFRAN) IV, oxyCODONE, sodium chloride flush Continuous Infusions:  lactated ringers 10 mL/hr at 04/30/21 1255     LOS: 25 days  Time spent: Greater than 50% of the 35 minute visit was spent in counseling/coordination of care for the patient as laid out in the A&P.   Dwyane Dee, MD [facility] 05/16/2021, 4:19 PM

## 2021-05-17 DIAGNOSIS — Z789 Other specified health status: Secondary | ICD-10-CM | POA: Diagnosis not present

## 2021-05-17 DIAGNOSIS — A419 Sepsis, unspecified organism: Secondary | ICD-10-CM | POA: Diagnosis not present

## 2021-05-17 LAB — GLUCOSE, CAPILLARY
Glucose-Capillary: 109 mg/dL — ABNORMAL HIGH (ref 70–99)
Glucose-Capillary: 136 mg/dL — ABNORMAL HIGH (ref 70–99)
Glucose-Capillary: 143 mg/dL — ABNORMAL HIGH (ref 70–99)
Glucose-Capillary: 235 mg/dL — ABNORMAL HIGH (ref 70–99)
Glucose-Capillary: 150 mg/dL — ABNORMAL HIGH (ref 70–99)

## 2021-05-17 NOTE — Progress Notes (Signed)
Progress Note    [name]   XTK:[id number]  DOB: 09/16/1966  DOA: 04/21/2021     55  PCP: Caprice Renshaw, MD  CC: leg pain  Hospital Course: [name] is a 55 yo female with PMH atrial fib on Eliquis, seizure disorder, tobacco abuse, COPD, PVD, HTN, DMII, dCHF, CKD, medical noncompliance, lack of capacity who presented with B/L leg pain from her nursing facility.  After work-up, she was ultimately found to have bilateral lower extremity leg ulcers with cellulitis, dry gangrene, and staph bacteremia. She was also evaluated by psychiatry during hospitalization and found to have lack of capacity due to inability to explain her reasoning for decision-making as well as lack of ability to explain reasoning for declining medical interventions including wound dressing changes.  Interval History:  No events overnight.   She refused PICC line care yesterday evening. I informed her this morning to allow dressing change today and PICC care if/when they return. She did not give me any opposition but she does seem to when nursing staff comes around.   ROS: Review of systems not obtained due to patient factors. Cognitive impairment   Assessment & Plan: * Sepsis (HCC)-resolved as of 05/13/2021 - LE gangrene as source - now s/p stent and dressing changes  Patient incapable of making informed decisions - See psychiatry evaluation.  Patient very clearly lacks decision-making capacity in regards to her care and disposition - If ongoing refusal of dressing changes and/or meds, will have to likely force treatment given lack of capacity. I've discussed this personally with [name] who is in agreement if necessary/needed. Future discussions will also be held with daughter [name]) and son [name], [phone])  - discharge placement pending a facility and bed found  - I called and spoke with [name] on 7/22 at 4pm. Update given, he was very thankful. Asked if there was anyway he could  be allowed to come on campus again to visit his mom to help be her support person as well; I informed him I would pass this request along but I did not know the answer at this time - I then called and spoke with [name] after his call; informed her of my communication with both of them; she did express concerns that he was known to bring "drugs" to his mom when she was at [facility] and that was her expressed concern if he visits patient in the hospital   Atherosclerosis of native artery of both legs with gangrene [facility]) -vascular and orthopedic surgery consulted, she is now s/p right superficial femoral / popliteal artery stenting on 6/29 by Dr. Stanford Breed and left lower extremity angiogram with second order cannulation  7/6 -intermittently refusing care; see decision making capacity; essentially patient does not have ability/capacity to refuse meds and care/dressing changes. If this continues, her daughter and son technically are shared decision makers and BOTH will need to be updated if major changes in care and/or consent is needed (this has been discussed at length with risk management and hospital accreditation liaison)  - psych also in agreement that patient cannot refuse care due to lack of capacity; if patient refuses, would first reattempt later in the day but if necessary, then temporary restraints may need to be utilized while performing dressing changes, etc  Atrial fibrillation with RVR ([name]) - Continue Eliquis and metoprolol  Acute renal failure superimposed on stage 3b chronic kidney disease (HCC)-resolved as of 05/13/2021 - patient has history of CKD3b. Baseline creat ~ 1.2, eGFR  58 - 39 - patient presents with increase in creat >0.3 mg/dL above baseline presumed to have occurred within past 7 days PTA - resolved with IVF   Seizure disorder ([name]) - Continue Keppra  MDD (major depressive disorder), recurrent, severe, with psychosis ([name]) - Continue Haldol, Cogentin, fluoxetine -  psychiatry following, appreciate assistance - will obtain intermittent EKGs per rec's while on Haldol (QTc 472 on 7/20)  Bacteremia due to Staphylococcus-resolved as of 05/13/2021 -this was likely in the setting of multiple LE ischemic ulcers. ID consulted and followed patient, she was maintained on antibiotics which have finished on 7/14 -remains afebrile, monitor off antibiotics  GERD (gastroesophageal reflux disease) - Continue Protonix  Chronic respiratory failure with hypoxia (HCC) - Continue chronic O2  Pressure injury of skin Perineum stage I pressure injury, present on arrival Coccyx right, stage II pressure injury, present on admission  COPD (chronic obstructive pulmonary disease) (HCC) - No signs/symptoms of exacerbation  Urinary incontinence - Continue oxybutynin  Medical non-compliance - See lack of decision-making  Insomnia - Continue trazodone  DMII (diabetes mellitus, type 2) ([name]) - continue SSI and CBGs  Bleeding hemorrhoids-resolved as of 05/13/2021 - Topical hydrocortisone  Hypomagnesemia-resolved as of 05/13/2021 - Repleted as needed  Hypokalemia-resolved as of 05/13/2021 - Repleted as needed   Old records reviewed in assessment of this patient  Antimicrobials:   DVT prophylaxis: apixaban (ELIQUIS) tablet 5 mg   Code Status:   Code Status: Full Code Family Communication: son and daughter   Disposition Plan: Status is: Inpatient  Remains inpatient appropriate because:Unsafe d/c plan  Dispo: The patient is from: Home              Anticipated d/c is to:  pending placement               Patient currently is medically stable to d/c.   Difficult to place patient Yes  Risk of unplanned readmission score: Unplanned Admission- Pilot do not use: 77.09   Objective: Blood pressure 121/86, pulse 87, temperature 99.3 F (37.4 C), temperature source Oral, resp. rate 16, height '5\' 1"'  (1.549 m), weight 74.7 kg, last menstrual period 05/19/2016, SpO2 99  %.  Examination: General appearance: alert and no distress Head: Normocephalic, without obvious abnormality, atraumatic Eyes:  EOMI Lungs: clear to auscultation bilaterally Heart: regular rate and rhythm and S1, S2 normal Abdomen: normal findings: bowel sounds normal and soft, non-tender Extremities:  LE dressings in place Skin: mobility and turgor normal Neurologic: followed commands, moves all 4 extremities  Consultants:  Psych  Procedures:    Data Reviewed: I have personally reviewed following labs and imaging studies Results for orders placed or performed during the hospital encounter of 04/21/21 (from the past 24 hour(s))  Glucose, capillary     Status: Abnormal   Collection Time: 05/16/21  8:46 PM  Result Value Ref Range   Glucose-Capillary 140 (H) 70 - 99 mg/dL  Glucose, capillary     Status: Abnormal   Collection Time: 05/17/21  3:06 AM  Result Value Ref Range   Glucose-Capillary 109 (H) 70 - 99 mg/dL  Glucose, capillary     Status: Abnormal   Collection Time: 05/17/21  7:15 AM  Result Value Ref Range   Glucose-Capillary 136 (H) 70 - 99 mg/dL  Glucose, capillary     Status: Abnormal   Collection Time: 05/17/21 11:46 AM  Result Value Ref Range   Glucose-Capillary 143 (H) 70 - 99 mg/dL   *Note: Due to a large number of  results and/or encounters for the requested time period, some results have not been displayed. A complete set of results can be found in Results Review.    Recent Results (from the past 240 hour(s))  Culture, Urine     Status: Abnormal   Collection Time: 05/08/21  5:10 AM   Specimen: Urine, Random  Result Value Ref Range Status   Specimen Description URINE, RANDOM  Final   Special Requests   Final    NONE Performed at [facility], [address], [place], [place] [address]    Culture MULTIPLE SPECIES PRESENT, SUGGEST RECOLLECTION (A)  Final   Report Status 05/09/2021 FINAL  Final     Radiology Studies: No results found. [name] EKG SITE  RITE  Final Result    HYBRID OR IMAGING ([facility] ONLY)  Final Result    [name] EKG SITE RITE  Final Result    HYBRID OR IMAGING ([facility] ONLY)  Final Result    [name] EKG SITE RITE  Final Result    DG Chest Port 1 View  Final Result      Scheduled Meds:  amLODipine  10 mg Oral Daily   apixaban  5 mg Oral BID   aspirin EC  81 mg Oral Daily   benztropine  0.5 mg Oral BID   Chlorhexidine Gluconate Cloth  6 each Topical Daily   feeding supplement  237 mL Oral BID BM   FLUoxetine  20 mg Oral Daily   haloperidol  5 mg Oral TID   hydrocortisone   Rectal BID   insulin aspart  0-5 Units Subcutaneous QHS   insulin aspart  0-9 Units Subcutaneous TID WC   insulin aspart  3 Units Subcutaneous TID WC   levETIRAcetam  750 mg Oral BID   metoprolol tartrate  50 mg Oral BID   multivitamin with minerals  1 tablet Oral Daily   nicotine  7 mg Transdermal Daily   oxybutynin  5 mg Oral QHS   pantoprazole  40 mg Oral Daily   rosuvastatin  10 mg Oral Daily   traZODone  50 mg Oral QHS   umeclidinium bromide  1 puff Inhalation Daily   zinc sulfate  220 mg Oral Daily   PRN Meds: acetaminophen **OR** acetaminophen, lip balm, LORazepam, morphine injection, ondansetron **OR** ondansetron (ZOFRAN) IV, oxyCODONE, sodium chloride flush Continuous Infusions:  lactated ringers 10 mL/hr at 04/30/21 1255     LOS: 26 days    Dwyane Dee, MD [facility] 05/17/2021, 3:20 PM

## 2021-05-18 DIAGNOSIS — Z789 Other specified health status: Secondary | ICD-10-CM | POA: Diagnosis not present

## 2021-05-18 DIAGNOSIS — A419 Sepsis, unspecified organism: Secondary | ICD-10-CM | POA: Diagnosis not present

## 2021-05-18 LAB — GLUCOSE, CAPILLARY
Glucose-Capillary: 229 mg/dL — ABNORMAL HIGH (ref 70–99)
Glucose-Capillary: 155 mg/dL — ABNORMAL HIGH (ref 70–99)
Glucose-Capillary: 148 mg/dL — ABNORMAL HIGH (ref 70–99)
Glucose-Capillary: 163 mg/dL — ABNORMAL HIGH (ref 70–99)

## 2021-05-18 MED ORDER — OXYCODONE HCL 5 MG PO TABS
5.0000 mg | ORAL_TABLET | Freq: Four times a day (QID) | ORAL | Status: DC | PRN
  Administered 2021-05-18 – 2021-05-22 (×5): 5 mg via ORAL
  Filled 2021-05-18 (×6): qty 1

## 2021-05-18 NOTE — Plan of Care (Signed)
  Problem: Nutrition: Goal: Adequate nutrition will be maintained Outcome: Progressing   Problem: Coping: Goal: Level of anxiety will decrease Outcome: Progressing   Problem: Pain Managment: Goal: General experience of comfort will improve Outcome: Progressing   Problem: Skin Integrity: Goal: Risk for impaired skin integrity will decrease Outcome: Progressing   

## 2021-05-18 NOTE — Progress Notes (Signed)
Progress Note    [name]   KWI:[id number]  DOB: 16-Jan-1966  DOA: 04/21/2021     27  PCP: Caprice Renshaw, MD  CC: leg pain  Hospital Course: [name] is a 55 yo female with PMH atrial fib on Eliquis, seizure disorder, tobacco abuse, COPD, PVD, HTN, DMII, dCHF, CKD, medical noncompliance, lack of capacity who presented with B/L leg pain from her nursing facility.  After work-up, she was ultimately found to have bilateral lower extremity leg ulcers with cellulitis, dry gangrene, and staph bacteremia. She was also evaluated by psychiatry during hospitalization and found to have lack of capacity due to inability to explain her reasoning for decision-making as well as lack of ability to explain reasoning for declining medical interventions including wound dressing changes.  Interval History:  No events overnight.   Allowed dressing change yesterday but refused this morning.  Mood same and cooperative when I see her in the mornings.   ROS: Review of systems not obtained due to patient factors. Cognitive impairment   Assessment & Plan: * Sepsis (HCC)-resolved as of 05/13/2021 - LE gangrene as source - now s/p stent and dressing changes  Patient incapable of making informed decisions - See psychiatry evaluation.  Patient very clearly lacks decision-making capacity in regards to her care and disposition - If ongoing refusal of dressing changes and/or meds, will have to likely force treatment given lack of capacity. I've discussed this personally with [name] who is in agreement if necessary/needed. Future discussions will also be held with daughter [name]) and son [name], [phone])  - discharge placement pending a facility and bed found  - I called and spoke with [name] on 7/22 at 4pm. Update given, he was very thankful. Asked if there was anyway he could be allowed to come on campus again to visit his mom to help be her support person as well; I informed him I  would pass this request along but I did not know the answer at this time; I attempted to call again on 05/18/21 @ ~3pm but no answer; it has been discussed amongst management, risk management, and nursing staff that we'll allow [name] a trial of being allowed to visit again in person starting 7/25 but any bad behavior/disrespect to staff will end in immediate termination of visitation rights once again and we will defer back to phone calls for providing him medical updates  - last spoke with [name] on 7/22; her main concern at that time she wanted to make [name] aware of was that Barbara Baltimore would sometimes bring "drugs" to his mom at [facility]  Atherosclerosis of native artery of both legs with gangrene [facility]) -vascular and orthopedic surgery consulted, she is now s/p right superficial femoral / popliteal artery stenting on 6/29 by Dr. Stanford Breed and left lower extremity angiogram with second order cannulation  7/6 -intermittently refusing care; see decision making capacity; essentially patient does not have ability/capacity to refuse meds and care/dressing changes. If this continues, her daughter and son technically are shared decision makers and BOTH will need to be updated if major changes in care and/or consent is needed (this has been discussed at length with risk management and hospital accreditation liaison)  - psych also in agreement that patient cannot refuse care due to lack of capacity; if patient refuses, would first reattempt later in the day but if necessary, then temporary restraints may need to be utilized while performing dressing changes, etc  Atrial fibrillation with RVR ([place]) - Continue Eliquis  and metoprolol  Acute renal failure superimposed on stage 3b chronic kidney disease (HCC)-resolved as of 05/13/2021 - patient has history of CKD3b. Baseline creat ~ 1.2, eGFR 35 - 39 - patient presents with increase in creat >0.3 mg/dL above baseline presumed to have occurred within past 7 days PTA -  resolved with IVF   Seizure disorder ([place]) - Continue Keppra  MDD (major depressive disorder), recurrent, severe, with psychosis ([place]) - Continue Haldol, Cogentin, fluoxetine - psychiatry following, appreciate assistance - will obtain intermittent EKGs per rec's while on Haldol (QTc 472 on 7/20)  Bacteremia due to Staphylococcus-resolved as of 05/13/2021 -this was likely in the setting of multiple LE ischemic ulcers. ID consulted and followed patient, she was maintained on antibiotics which have finished on 7/14 -remains afebrile, monitor off antibiotics  GERD (gastroesophageal reflux disease) - Continue Protonix  Chronic respiratory failure with hypoxia (HCC) - Continue chronic O2  Pressure injury of skin Perineum stage I pressure injury, present on arrival Coccyx right, stage II pressure injury, present on admission  COPD (chronic obstructive pulmonary disease) (HCC) - No signs/symptoms of exacerbation  Urinary incontinence - Continue oxybutynin  Medical non-compliance - See lack of decision-making  Insomnia - Continue trazodone  DMII (diabetes mellitus, type 2) ([place]) - continue SSI and CBGs  Bleeding hemorrhoids-resolved as of 05/13/2021 - Topical hydrocortisone  Hypomagnesemia-resolved as of 05/13/2021 - Repleted as needed  Hypokalemia-resolved as of 05/13/2021 - Repleted as needed   Old records reviewed in assessment of this patient  Antimicrobials:   DVT prophylaxis: apixaban (ELIQUIS) tablet 5 mg   Code Status:   Code Status: Full Code Family Communication: son and daughter   Disposition Plan: Status is: Inpatient  Remains inpatient appropriate because:Unsafe d/c plan  Dispo: The patient is from: Home              Anticipated d/c is to:  pending placement               Patient currently is medically stable to d/c.   Difficult to place patient Yes  Risk of unplanned readmission score: Unplanned Admission- Pilot do not use: 70    Objective: Blood pressure (!) 146/94, pulse 73, temperature 97.8 F (36.6 C), resp. rate 18, height '5\' 1"'  (1.549 m), weight 74.7 kg, last menstrual period 05/19/2016, SpO2 100 %.  Examination: General appearance: alert and no distress Head: Normocephalic, without obvious abnormality, atraumatic Eyes:  EOMI Lungs: clear to auscultation bilaterally Heart: regular rate and rhythm and S1, S2 normal Abdomen: normal findings: bowel sounds normal and soft, non-tender Extremities:  LE dressings in place Skin: mobility and turgor normal Neurologic: followed commands, moves all 4 extremities  Consultants:  Psych  Procedures:    Data Reviewed: I have personally reviewed following labs and imaging studies Results for orders placed or performed during the hospital encounter of 04/21/21 (from the past 24 hour(s))  Glucose, capillary     Status: Abnormal   Collection Time: 05/17/21  4:57 PM  Result Value Ref Range   Glucose-Capillary 235 (H) 70 - 99 mg/dL  Glucose, capillary     Status: Abnormal   Collection Time: 05/17/21  9:00 PM  Result Value Ref Range   Glucose-Capillary 150 (H) 70 - 99 mg/dL  Glucose, capillary     Status: Abnormal   Collection Time: 05/18/21  6:44 AM  Result Value Ref Range   Glucose-Capillary 229 (H) 70 - 99 mg/dL  Glucose, capillary     Status: Abnormal   Collection Time:  05/18/21 11:34 AM  Result Value Ref Range   Glucose-Capillary 155 (H) 70 - 99 mg/dL   *Note: Due to a large number of results and/or encounters for the requested time period, some results have not been displayed. A complete set of results can be found in Results Review.    No results found for this or any previous visit (from the past 240 hour(s)).    Radiology Studies: No results found. [name] EKG SITE RITE  Final Result    HYBRID OR IMAGING ([facility] ONLY)  Final Result    [name] EKG SITE RITE  Final Result    HYBRID OR IMAGING ([facility] ONLY)  Final Result    [name] EKG SITE RITE  Final Result     DG Chest Port 1 View  Final Result      Scheduled Meds:  amLODipine  10 mg Oral Daily   apixaban  5 mg Oral BID   aspirin EC  81 mg Oral Daily   benztropine  0.5 mg Oral BID   Chlorhexidine Gluconate Cloth  6 each Topical Daily   feeding supplement  237 mL Oral BID BM   FLUoxetine  20 mg Oral Daily   haloperidol  5 mg Oral TID   hydrocortisone   Rectal BID   insulin aspart  0-5 Units Subcutaneous QHS   insulin aspart  0-9 Units Subcutaneous TID WC   insulin aspart  3 Units Subcutaneous TID WC   levETIRAcetam  750 mg Oral BID   metoprolol tartrate  50 mg Oral BID   multivitamin with minerals  1 tablet Oral Daily   nicotine  7 mg Transdermal Daily   oxybutynin  5 mg Oral QHS   pantoprazole  40 mg Oral Daily   rosuvastatin  10 mg Oral Daily   traZODone  50 mg Oral QHS   umeclidinium bromide  1 puff Inhalation Daily   zinc sulfate  220 mg Oral Daily   PRN Meds: acetaminophen **OR** acetaminophen, lip balm, LORazepam, morphine injection, ondansetron **OR** ondansetron (ZOFRAN) IV, oxyCODONE, sodium chloride flush Continuous Infusions:  lactated ringers 10 mL/hr at 04/30/21 1255     LOS: 27 days    Dwyane Dee, MD [facility] 05/18/2021, 2:56 PM

## 2021-05-18 NOTE — Progress Notes (Signed)
Pt refused BLE dressing change this morning.

## 2021-05-19 DIAGNOSIS — Z789 Other specified health status: Secondary | ICD-10-CM | POA: Diagnosis not present

## 2021-05-19 DIAGNOSIS — A419 Sepsis, unspecified organism: Secondary | ICD-10-CM | POA: Diagnosis not present

## 2021-05-19 DIAGNOSIS — F333 Major depressive disorder, recurrent, severe with psychotic symptoms: Secondary | ICD-10-CM | POA: Diagnosis not present

## 2021-05-19 LAB — BASIC METABOLIC PANEL
Anion gap: 6 (ref 5–15)
BUN: 6 mg/dL (ref 6–20)
CO2: 27 mmol/L (ref 22–32)
Calcium: 8.7 mg/dL — ABNORMAL LOW (ref 8.9–10.3)
Chloride: 107 mmol/L (ref 98–111)
Creatinine, Ser: 1.05 mg/dL — ABNORMAL HIGH (ref 0.44–1.00)
GFR, Estimated: >60 mL/min (ref >60)
Glucose, Bld: 194 mg/dL — ABNORMAL HIGH (ref 70–99)
Potassium: 3.8 mmol/L (ref 3.5–5.1)
Sodium: 140 mmol/L (ref 135–145)

## 2021-05-19 LAB — SEDIMENTATION RATE: Sed Rate: 109 mm/hr — ABNORMAL HIGH (ref 0–22)

## 2021-05-19 LAB — CBC
HCT: 29.5 % — ABNORMAL LOW (ref 36.0–46.0)
Hemoglobin: 8.8 g/dL — ABNORMAL LOW (ref 12.0–15.0)
MCH: 28 pg (ref 26.0–34.0)
MCHC: 29.8 g/dL — ABNORMAL LOW (ref 30.0–36.0)
MCV: 93.9 fL (ref 80.0–100.0)
Platelets: 295 10*3/uL (ref 150–400)
RBC: 3.14 MIL/uL — ABNORMAL LOW (ref 3.87–5.11)
RDW: 16.1 % — ABNORMAL HIGH (ref 11.5–15.5)
WBC: 10.3 10*3/uL (ref 4.0–10.5)
nRBC: 0 % (ref 0.0–0.2)

## 2021-05-19 LAB — C-REACTIVE PROTEIN: CRP: 3.1 mg/dL — ABNORMAL HIGH (ref <1.0)

## 2021-05-19 LAB — GLUCOSE, CAPILLARY
Glucose-Capillary: 175 mg/dL — ABNORMAL HIGH (ref 70–99)
Glucose-Capillary: 151 mg/dL — ABNORMAL HIGH (ref 70–99)
Glucose-Capillary: 190 mg/dL — ABNORMAL HIGH (ref 70–99)
Glucose-Capillary: 174 mg/dL — ABNORMAL HIGH (ref 70–99)

## 2021-05-19 NOTE — Progress Notes (Signed)
   05/19/21 1559  Clinical Encounter Type  Visited With Patient  Visit Type Initial  Referral From Social work  Consult/Referral To Chaplain  Spiritual Encounters  Spiritual Needs Literature (HCPOA)  Chaplain received a page from [name] that [name] wanted help with HPOA.  [name] and I reviewed the paperwork and talked about her current leg diagnoses. She wanted to hold off on filling out the paperwork until Monday Aug 1st so she can talk with her children and family.   Chaplain Yuriy Cui Morgan-Simpson  [phone]

## 2021-05-19 NOTE — Progress Notes (Signed)
Progress Note    [name]   [id number]:[id number]  DOB: 30-May-1966  DOA: 04/21/2021     28  PCP: Caprice Renshaw, MD  CC: leg pain  Hospital Course: [name] is a 55 yo female with PMH atrial fib on Eliquis, seizure disorder, tobacco abuse, COPD, PVD, HTN, DMII, dCHF, CKD, medical noncompliance, lack of capacity who presented with B/L leg pain from her nursing facility.  After work-up, she was ultimately found to have bilateral lower extremity leg ulcers with cellulitis, dry gangrene, and staph bacteremia. She was also evaluated by psychiatry during hospitalization and found to have lack of capacity due to inability to explain her reasoning for decision-making as well as lack of ability to explain reasoning for declining medical interventions including wound dressing changes.  Interval History:  Typically refuses dressing changes in the morning. Last change was 7/23. Told her this morning we needed to do a change today; also talked to nurse after. If able, would be beneficial to get patient to a recliner and out of bed some as well.   ROS: Review of systems not obtained due to patient factors. Cognitive impairment   Assessment & Plan: * Sepsis (HCC)-resolved as of 05/13/2021 - LE gangrene as source - now s/p stent and dressing changes  Patient incapable of making informed decisions - See psychiatry evaluation.  Patient very clearly lacks decision-making capacity in regards to her care and disposition - If ongoing refusal of dressing changes and/or meds, will have to likely force treatment given lack of capacity. I've discussed this personally with [name] who is in agreement if necessary/needed. Future discussions will also be held with daughter [name]) and son [name], [phone])  - discharge placement pending a facility and bed found  - I called and spoke with [name] on 7/22 at 4pm. Update given, he was very thankful. Asked if there was anyway he could be  allowed to come on campus again to visit his mom to help be her support person as well; I informed him I would pass this request along but I did not know the answer at this time; I attempted to call again on 05/18/21 @ ~3pm but no answer; it has been discussed amongst management, risk management, and nursing staff that we'll allow [name] a trial of being allowed to visit again in person starting 7/25 but any bad behavior/disrespect to staff will end in immediate termination of visitation rights once again and we will defer back to phone calls for providing him medical updates  - last spoke with [name] on 7/22; her main concern at that time she wanted to make [name] aware of was that Barbara Baltimore would sometimes bring "drugs" to his mom at [facility]  Atherosclerosis of native artery of both legs with gangrene [facility]) -vascular and orthopedic surgery consulted, she is now s/p right superficial femoral / popliteal artery stenting on 6/29 by Dr. Stanford Breed and left lower extremity angiogram with second order cannulation 7/6 -intermittently refusing care; see decision making capacity; essentially patient does not have ability/capacity to refuse meds and care/dressing changes. If this continues, her daughter and son technically are shared decision makers and BOTH will need to be updated if major changes in care and/or consent is needed (this has been discussed at length with risk management and hospital accreditation liaison)  - psych also in agreement that patient cannot refuse care due to lack of capacity; if patient refuses, would first reattempt later in the day but if necessary, then temporary restraints  may need to be utilized while performing dressing changes, etc  Atrial fibrillation with RVR (HCC) - Continue Eliquis and metoprolol  Acute renal failure superimposed on stage 3b chronic kidney disease (HCC)-resolved as of 05/13/2021 - patient has history of CKD3b. Baseline creat ~ 1.2, eGFR 35 - 39 - patient presents  with increase in creat >0.3 mg/dL above baseline presumed to have occurred within past 7 days PTA - resolved with IVF   Seizure disorder ([place]) - Continue Keppra  MDD (major depressive disorder), recurrent, severe, with psychosis ([place]) - Continue Haldol, Cogentin, fluoxetine - psychiatry following, appreciate assistance - will obtain intermittent EKGs per rec's while on Haldol (QTc 472 on 7/20)  Bacteremia due to Staphylococcus-resolved as of 05/13/2021 -this was likely in the setting of multiple LE ischemic ulcers. ID consulted and followed patient, she was maintained on antibiotics which have finished on 7/14 -remains afebrile, monitor off antibiotics  GERD (gastroesophageal reflux disease) - Continue Protonix  Chronic respiratory failure with hypoxia (HCC) - Continue chronic O2  Pressure injury of skin Perineum stage I pressure injury, present on arrival Coccyx right, stage II pressure injury, present on admission  COPD (chronic obstructive pulmonary disease) (HCC) - No signs/symptoms of exacerbation  Urinary incontinence - Continue oxybutynin  Medical non-compliance - See lack of decision-making  Insomnia - Continue trazodone  DMII (diabetes mellitus, type 2) ([place]) - continue SSI and CBGs  Bleeding hemorrhoids-resolved as of 05/13/2021 - Topical hydrocortisone  Hypomagnesemia-resolved as of 05/13/2021 - Repleted as needed  Hypokalemia-resolved as of 05/13/2021 - Repleted as needed   Old records reviewed in assessment of this patient  Antimicrobials:   DVT prophylaxis: apixaban (ELIQUIS) tablet 5 mg   Code Status:   Code Status: Full Code Family Communication: son and daughter   Disposition Plan: Status is: Inpatient  Remains inpatient appropriate because:Unsafe d/c plan  Dispo: The patient is from: Home              Anticipated d/c is to:  pending placement               Patient currently is medically stable to d/c.   Difficult to place patient  Yes  Risk of unplanned readmission score: Unplanned Admission- Pilot do not use: 76.95   Objective: Blood pressure 139/88, pulse 89, temperature 99.6 F (37.6 C), resp. rate 18, height '5\' 1"'  (1.549 m), weight 74.7 kg, last menstrual period 05/19/2016, SpO2 94 %.  Examination: General appearance: alert and no distress Head: Normocephalic, without obvious abnormality, atraumatic Eyes:  EOMI Lungs: clear to auscultation bilaterally Heart: regular rate and rhythm and S1, S2 normal Abdomen: normal findings: bowel sounds normal and soft, non-tender Extremities:  LE dressings in place Skin: mobility and turgor normal Neurologic: followed commands, moves all 4 extremities  Consultants:  Psych  Procedures:    Data Reviewed: I have personally reviewed following labs and imaging studies Results for orders placed or performed during the hospital encounter of 04/21/21 (from the past 24 hour(s))  Glucose, capillary     Status: Abnormal   Collection Time: 05/18/21  4:38 PM  Result Value Ref Range   Glucose-Capillary 148 (H) 70 - 99 mg/dL  Glucose, capillary     Status: Abnormal   Collection Time: 05/18/21  8:46 PM  Result Value Ref Range   Glucose-Capillary 163 (H) 70 - 99 mg/dL  Glucose, capillary     Status: Abnormal   Collection Time: 05/19/21  2:43 AM  Result Value Ref Range   Glucose-Capillary 175 (  H) 70 - 99 mg/dL  Basic metabolic panel     Status: Abnormal   Collection Time: 05/19/21  3:27 AM  Result Value Ref Range   Sodium 140 135 - 145 mmol/L   Potassium 3.8 3.5 - 5.1 mmol/L   Chloride 107 98 - 111 mmol/L   CO2 27 22 - 32 mmol/L   Glucose, Bld 194 (H) 70 - 99 mg/dL   BUN 6 6 - 20 mg/dL   Creatinine, Ser 1.05 (H) 0.44 - 1.00 mg/dL   Calcium 8.7 (L) 8.9 - 10.3 mg/dL   GFR, Estimated >60 >60 mL/min   Anion gap 6 5 - 15  CBC     Status: Abnormal   Collection Time: 05/19/21  3:27 AM  Result Value Ref Range   WBC 10.3 4.0 - 10.5 K/uL   RBC 3.14 (L) 3.87 - 5.11 MIL/uL    Hemoglobin 8.8 (L) 12.0 - 15.0 g/dL   HCT 29.5 (L) 36.0 - 46.0 %   MCV 93.9 80.0 - 100.0 fL   MCH 28.0 26.0 - 34.0 pg   MCHC 29.8 (L) 30.0 - 36.0 g/dL   RDW 16.1 (H) 11.5 - 15.5 %   Platelets 295 150 - 400 K/uL   nRBC 0.0 0.0 - 0.2 %  Glucose, capillary     Status: Abnormal   Collection Time: 05/19/21  6:58 AM  Result Value Ref Range   Glucose-Capillary 151 (H) 70 - 99 mg/dL  Glucose, capillary     Status: Abnormal   Collection Time: 05/19/21 11:53 AM  Result Value Ref Range   Glucose-Capillary 190 (H) 70 - 99 mg/dL   *Note: Due to a large number of results and/or encounters for the requested time period, some results have not been displayed. A complete set of results can be found in Results Review.    No results found for this or any previous visit (from the past 240 hour(s)).    Radiology Studies: No results found. [name] EKG SITE RITE  Final Result    HYBRID OR IMAGING ([facility] ONLY)  Final Result    [name] EKG SITE RITE  Final Result    HYBRID OR IMAGING ([facility] ONLY)  Final Result    [name] EKG SITE RITE  Final Result    DG Chest Port 1 View  Final Result      Scheduled Meds:  amLODipine  10 mg Oral Daily   apixaban  5 mg Oral BID   aspirin EC  81 mg Oral Daily   benztropine  0.5 mg Oral BID   Chlorhexidine Gluconate Cloth  6 each Topical Daily   feeding supplement  237 mL Oral BID BM   FLUoxetine  20 mg Oral Daily   haloperidol  5 mg Oral TID   insulin aspart  0-5 Units Subcutaneous QHS   insulin aspart  0-9 Units Subcutaneous TID WC   insulin aspart  3 Units Subcutaneous TID WC   levETIRAcetam  750 mg Oral BID   metoprolol tartrate  50 mg Oral BID   multivitamin with minerals  1 tablet Oral Daily   nicotine  7 mg Transdermal Daily   oxybutynin  5 mg Oral QHS   pantoprazole  40 mg Oral Daily   rosuvastatin  10 mg Oral Daily   traZODone  50 mg Oral QHS   umeclidinium bromide  1 puff Inhalation Daily   zinc sulfate  220 mg Oral Daily   PRN Meds: acetaminophen **OR**  acetaminophen, lip balm, LORazepam, morphine  injection, ondansetron **OR** ondansetron (ZOFRAN) IV, oxyCODONE, sodium chloride flush Continuous Infusions:  lactated ringers 10 mL/hr at 04/30/21 1255     LOS: 28 days    Dwyane Dee, MD [facility] 05/19/2021, 1:33 PM

## 2021-05-19 NOTE — Progress Notes (Signed)
Pt refused wound care at this time.

## 2021-05-19 NOTE — TOC Progression Note (Signed)
Transition of Care [facility]) - Progression Note    Patient Details  Name: [name] MRN: [id number] Date of Birth: 1966-10-08  [facility] of [facility] CM/SW Contact  Milinda Antis, LCSWA Phone Number: 05/19/2021, 11:19 AM  Clinical Narrative:    11:19- CSW attempted to contact Irven Shelling with admissions at [facility].  There was no answer.  CSW left a VM requesting a returned call with information about whether the patient can return to the facility.         Expected Discharge Plan and Services                                                 Social Determinants of Health (SDOH) Interventions    Readmission Risk Interventions Readmission Risk Prevention Plan 10/31/2020 08/27/2020  Transportation Screening Complete Complete  Medication Review Press photographer) Complete Complete  PCP or Specialist appointment within 3-5 days of discharge - Complete  HRI or [place] - Complete  SW Recovery Care/Counseling Consult Complete Complete  Palliative Care Screening Not Applicable Not Applicable  Skilled Nursing Facility Complete Complete  Some recent data might be hidden

## 2021-05-19 NOTE — Plan of Care (Addendum)
Spoke with Dr. Oneida Alar this afternoon. Confirmed and clarified the recommendations are for B/L AKA for definitive management of this patient.  Wound care as currently being done is not likely to make any meaningful improvement.   I then called and talked to Chad (daughter and son respectively). Both are technically in agreement and "consenting" for B/L AKA's at this time. However, after evaluation by psychiatry today, 05/19/21, the patient has now been deemed to have capacity for medical decision making.  Will have to have further discussions with patient and her children for the next steps.   We have lifted visitation restrictions on [name]; he is going to come talk to his mom about the AKAs and see if she's agreeable (he says she was previously when they talked last). If patient is not agreeable, I will have to discuss further with psych what our options are if any.   Next step is that I will let [name] and [name] take the evening to think over today's discussion and we will re-discuss again tomorrow to see if everyone still on the same page and what the next steps are.   Aris EvertsFO:[id number] QuintonNT:[id number]  Dwyane Dee, MD [facility] 05/19/2021, 2:52 PM

## 2021-05-19 NOTE — Consult Note (Signed)
[facility] Face-to-Face Psychiatry Consult   Reason for Consult:  Mood dysregulation Referring Physician:  Nolberto Hanlon, MD Patient Identification: [name] MRN:  [id number] Principal Diagnosis: Sepsis [facility]) Diagnosis:  Active Problems:   DMII (diabetes mellitus, type 2) ([place])   Essential hypertension   Insomnia   Medical non-compliance   Poor mobility   HTN (hypertension), malignant   Diabetic neuropathy ([place])   MDD (major depressive disorder), recurrent, severe, with psychosis ([place])   Urinary incontinence   COPD (chronic obstructive pulmonary disease) (HCC)   PAF (paroxysmal atrial fibrillation) (HCC)   Atrial fibrillation with RVR ([place])   Pressure injury of skin   Seizure disorder ([place])   Sacral ulcer, limited to breakdown of skin ([place])   Multiple duodenal ulcers   Chronic respiratory failure with hypoxia ([place])   Atherosclerosis of native artery of both legs with gangrene ([place])   Patient incapable of making informed decisions   GERD (gastroesophageal reflux disease)   Total Time spent with patient: 15 minutes  Subjective:   [name] is a 55 y.o. female patient admitted with   bilateral leg pain w/ PMH of depression, tobacco use disorder, severe PVD, paroxysmal aticoagulant, HLD, HTN, uncontrolled T2DM, CHF, CKD.  HPI:   On assessment this AM patient is irritable and reports that she is in a bad mood. When asked why her mood is not good today she reports "life." Patient does not give a specific reason. Patient focuses on attending physician and reports that she thinks that the team is spreading "doom." Patient asked attending physician to take the butter on her tray and place it on the floor. When patient was notified this would not happened and could be a hazard the patient became irate and reported, "everyone did it for me last week! I want it on the floor." Patient reported that she did not wish to have the blinds open when this provider attempted to open them, but she  was willing to have the TV turned on. Patient did not endorse SI, HI, nor AVH. Patient was surprised to find out that her son may be able to have some visits and reported that she was upset that he had not already come. Patient was informed that it was still morning and patient reported she thought it was evening time.  Past Psychiatric History: Reports OP psych 4-5 visits total. Also report being dx with "Clinical depression" Recalls being on haldol for depression. Recalls taking Paxil and Prozac in the past.   Of note patient was not taking her Keppra prior to hospitalization despite having a seizure in 07/2020.   Risk to Self:  NO Risk to Others:  NO Prior Inpatient Therapy:  NO Prior Outpatient Therapy:  YES  Past Medical History:  Past Medical History:  Diagnosis Date   Alcohol use    Ankle fracture, lateral malleolus, closed 2013   Anxiety    Aortic atherosclerosis ([place]) 03/10/2021   Breast mass, left 2013   CHF (congestive heart failure) ([place])    a. EF 20-25% by echo in 05/2016 with cath showing normal cors b. EF 50-55% in 07/2020 c. 01/2021: EF at 55-60% with moderate LVH   Chronic anemia    CKD (chronic kidney disease), stage III (HCC)    Cocaine abuse (HCC)    COPD (chronic obstructive pulmonary disease) ([place])    Diabetes mellitus, type 2 (HCC)    Diabetic Charcot foot ([place])    Essential hypertension    History of cardiomyopathy  History of GI bleed 2011   Hyperlipidemia    Noncompliance    Obesity    PAF (paroxysmal atrial fibrillation) (HCC)    Panic attacks    PAT (paroxysmal atrial tachycardia) (HCC)    Previously on Amiodarone   Seizures ([place]) 10/01/2020   Sleep apnea    Not on CPAP   Stroke ([place]) 2018   Tobacco abuse    Urinary incontinence     Past Surgical History:  Procedure Laterality Date   ANGIOPLASTY ILLIAC ARTERY Right 04/23/2021   Procedure: ANGIOPLASTY AND STENT SUPERFICIAL FEMORAL ARTERY;  Surgeon: Cherre Robins, MD;  Location: [place];   Service: Vascular;  Laterality: Right;   AORTOGRAM  04/23/2021   Procedure: AORTOGRAM;  Surgeon: Cherre Robins, MD;  Location: [place];  Service: Vascular;;   AORTOGRAM Left 04/30/2021   Procedure: left lower extremity angiogram with second order cannulation;  Surgeon: Cherre Robins, MD;  Location: [place];  Service: Vascular;  Laterality: Left;   BIOPSY  11/12/2020   Procedure: BIOPSY;  Surgeon: Harvel Quale, MD;  [facility];  Service: Gastroenterology;;   BREAST BIOPSY     CARDIAC CATHETERIZATION N/A 07/28/2016   Procedure: Left Heart Cath and Coronary Angiography;  Surgeon: Jettie Booze, MD;  Location: [place] [facility];  Service: Cardiovascular;  Laterality: N/A;   COLONOSCOPY N/A 05/10/2019   Procedure: COLONOSCOPY;  Surgeon: Danie Binder, MD;  [facility];  Service: Endoscopy;  Laterality: N/A;  Phenergan 12.5 mg IV in pre-op   DILATION AND CURETTAGE OF UTERUS     ESOPHAGOGASTRODUODENOSCOPY (EGD) WITH PROPOFOL N/A 11/12/2020   Procedure: ESOPHAGOGASTRODUODENOSCOPY (EGD) WITH PROPOFOL;  Surgeon: Harvel Quale, MD;  [facility];  Service: Gastroenterology;  Laterality: N/A;   I & D EXTREMITY Bilateral 09/22/2017   Procedure: BILATERAL DEBRIDEMENT LEG/FOOT ULCERS, APPLY VERAFLO WOUND VAC;  Surgeon: Newt Minion, MD;  Location: [place];  Service: Orthopedics;  Laterality: Bilateral;   I & D EXTREMITY Right 10/11/2018   Procedure: IRRIGATION AND DEBRIDEMENT RIGHT HAND;  Surgeon: [name], MD;  Location: [place];  Service: Orthopedics;  Laterality: Right;   I & D EXTREMITY Right 10/13/2018   Procedure: REPEAT IRRIGATION AND DEBRIDEMENT RIGHT HAND;  Surgeon: [name], MD;  Location: [place];  Service: Orthopedics;  Laterality: Right;   I & D EXTREMITY Right 11/22/2018   Procedure: IRRIGATION AND DEBRIDEMENT AND PINNING RIGHT HAND;  Surgeon: [name], MD;  Location: [place];  Service: Orthopedics;  Laterality:  Right;   IR RADIOLOGIST EVAL & MGMT  07/05/2018   LOWER EXTREMITY ANGIOGRAM Bilateral 04/23/2021   Procedure: RIGHT  AND LEFT LOWER EXTREMITY ANGIOGRAM;  Surgeon: Cherre Robins, MD;  Location: [place];  Service: Vascular;  Laterality: Bilateral;   POLYPECTOMY  05/10/2019   Procedure: POLYPECTOMY;  Surgeon: Danie Binder, MD;  [facility];  Service: Endoscopy;;   SKIN SPLIT GRAFT Bilateral 09/28/2017   Procedure: BILATERAL SPLIT THICKNESS SKIN GRAFT LEGS/FEET AND APPLY VAC;  Surgeon: Newt Minion, MD;  Location: [place];  Service: Orthopedics;  Laterality: Bilateral;   SKIN SPLIT GRAFT Right 11/22/2018   Procedure: SKIN GRAFT SPLIT THICKNESS;  Surgeon: [name], MD;  Location: [place];  Service: Orthopedics;  Laterality: Right;   Family History:  Family History  Problem Relation Age of Onset   Hypertension Mother    Heart attack Mother    Hypertension Father  CABG    Hypertension Sister    Hypertension Brother    Hypertension Sister    Cancer Sister        breast    Arthritis Other    Cancer Other    Diabetes Other    Asthma Other    Hypertension Daughter    Hypertension Son    Family Psychiatric  History: None known Social History:  Social History   Substance and Sexual Activity  Alcohol Use Not Currently   Alcohol/week: 0.0 standard drinks   Comment: Quit in 2017     Social History   Substance and Sexual Activity  Drug Use Not Currently   Frequency: 3.0 times per week   Types: Marijuana, Cocaine   Comment: none since August 2018    Social History   Socioeconomic History   Marital status: Widowed    Spouse name: Not on file   Number of children: 2   Years of education: 9   Highest education level: 9th grade  Occupational History   Occupation: Disabled  Tobacco Use   Smoking status: Some Days    Packs/day: 0.25    Years: 20.00    Pack years: 5.00    Types: Cigarettes    Last attempt to quit: 06/03/2017    Years since quitting: 3.9    Smokeless tobacco: Never  Vaping Use   Vaping Use: Never used  Substance and Sexual Activity   Alcohol use: Not Currently    Alcohol/week: 0.0 standard drinks    Comment: Quit in 2017   Drug use: Not Currently    Frequency: 3.0 times per week    Types: Marijuana, Cocaine    Comment: none since August 2018   Sexual activity: Not on file  Other Topics Concern   Not on file  Social History Narrative   Right handed   Drinks 1-2 cups caffeine daily   Social Determinants of Health   Financial Resource Strain: Medium Risk   Difficulty of Paying Living Expenses: Somewhat hard  Food Insecurity: Food Insecurity Present   Worried About Charity fundraiser in the Last Year: Sometimes true   Ran Out of Food in the Last Year: Sometimes true  Transportation Needs: No Transportation Needs   Lack of Transportation (Medical): No   Lack of Transportation (Non-Medical): No  Physical Activity: Insufficiently Active   Days of Exercise per Week: 7 days   Minutes of Exercise per Session: 20 min  Stress: Stress Concern Present   Feeling of Stress : To some extent  Social Connections: Moderately Isolated   Frequency of Communication with Friends and Family: More than three times a week   Frequency of Social Gatherings with Friends and Family: More than three times a week   Attends Religious Services: More than 4 times per year   Active Member of [employer] or Organizations: No   Attends Archivist Meetings: Never   Marital Status: Widowed   Additional Social History:    Allergies:  No Known Allergies  Labs:  Results for orders placed or performed during the hospital encounter of 04/21/21 (from the past 48 hour(s))  Glucose, capillary     Status: Abnormal   Collection Time: 05/17/21  4:57 PM  Result Value Ref Range   Glucose-Capillary 235 (H) 70 - 99 mg/dL    Comment: Glucose reference range applies only to samples taken after fasting for at least 8 hours.  Glucose, capillary      Status: Abnormal   Collection  Time: 05/17/21  9:00 PM  Result Value Ref Range   Glucose-Capillary 150 (H) 70 - 99 mg/dL    Comment: Glucose reference range applies only to samples taken after fasting for at least 8 hours.  Glucose, capillary     Status: Abnormal   Collection Time: 05/18/21  6:44 AM  Result Value Ref Range   Glucose-Capillary 229 (H) 70 - 99 mg/dL    Comment: Glucose reference range applies only to samples taken after fasting for at least 8 hours.  Glucose, capillary     Status: Abnormal   Collection Time: 05/18/21 11:34 AM  Result Value Ref Range   Glucose-Capillary 155 (H) 70 - 99 mg/dL    Comment: Glucose reference range applies only to samples taken after fasting for at least 8 hours.  Glucose, capillary     Status: Abnormal   Collection Time: 05/18/21  4:38 PM  Result Value Ref Range   Glucose-Capillary 148 (H) 70 - 99 mg/dL    Comment: Glucose reference range applies only to samples taken after fasting for at least 8 hours.  Glucose, capillary     Status: Abnormal   Collection Time: 05/18/21  8:46 PM  Result Value Ref Range   Glucose-Capillary 163 (H) 70 - 99 mg/dL    Comment: Glucose reference range applies only to samples taken after fasting for at least 8 hours.  Glucose, capillary     Status: Abnormal   Collection Time: 05/19/21  2:43 AM  Result Value Ref Range   Glucose-Capillary 175 (H) 70 - 99 mg/dL    Comment: Glucose reference range applies only to samples taken after fasting for at least 8 hours.  Basic metabolic panel     Status: Abnormal   Collection Time: 05/19/21  3:27 AM  Result Value Ref Range   Sodium 140 135 - 145 mmol/L   Potassium 3.8 3.5 - 5.1 mmol/L   Chloride 107 98 - 111 mmol/L   CO2 27 22 - 32 mmol/L   Glucose, Bld 194 (H) 70 - 99 mg/dL    Comment: Glucose reference range applies only to samples taken after fasting for at least 8 hours.   BUN 6 6 - 20 mg/dL   Creatinine, Ser 1.05 (H) 0.44 - 1.00 mg/dL   Calcium 8.7 (L) 8.9 - 10.3  mg/dL   GFR, Estimated >60 >60 mL/min    Comment: (NOTE) Calculated using the CKD-EPI Creatinine Equation (2021)    Anion gap 6 5 - 15    Comment: Performed at [facility] [address] [place], [place] [address]  CBC     Status: Abnormal   Collection Time: 05/19/21  3:27 AM  Result Value Ref Range   WBC 10.3 4.0 - 10.5 K/uL   RBC 3.14 (L) 3.87 - 5.11 MIL/uL   Hemoglobin 8.8 (L) 12.0 - 15.0 g/dL   HCT 29.5 (L) 36.0 - 46.0 %   MCV 93.9 80.0 - 100.0 fL   MCH 28.0 26.0 - 34.0 pg   MCHC 29.8 (L) 30.0 - 36.0 g/dL   RDW 16.1 (H) 11.5 - 15.5 %   Platelets 295 150 - 400 K/uL   nRBC 0.0 0.0 - 0.2 %    Comment: Performed at [facility], [place] [address], [place], [place] [address]  Glucose, capillary     Status: Abnormal   Collection Time: 05/19/21  6:58 AM  Result Value Ref Range   Glucose-Capillary 151 (H) 70 - 99 mg/dL  Comment: Glucose reference range applies only to samples taken after fasting for at least 8 hours.  Glucose, capillary     Status: Abnormal   Collection Time: 05/19/21 11:53 AM  Result Value Ref Range   Glucose-Capillary 190 (H) 70 - 99 mg/dL    Comment: Glucose reference range applies only to samples taken after fasting for at least 8 hours.   *Note: Due to a large number of results and/or encounters for the requested time period, some results have not been displayed. A complete set of results can be found in Results Review.    Current Facility-Administered Medications  Medication Dose Route Frequency Provider Last Rate Last Admin   acetaminophen (TYLENOL) tablet 650 mg  650 mg Oral Q6H PRN Johnson, Clanford L, MD   650 mg at 05/15/21 0535   Or   acetaminophen (TYLENOL) suppository 650 mg  650 mg Rectal Q6H PRN Johnson, Clanford L, MD       amLODipine (NORVASC) tablet 10 mg  10 mg Oral Daily Johnson, Clanford L, MD   10 mg at 05/19/21 0934   apixaban (ELIQUIS) tablet 5 mg  5 mg Oral BID Edwin Dada, MD   5 mg at 05/19/21 [id number]   aspirin  EC tablet 81 mg  81 mg Oral Daily Dagoberto Ligas, PA-C   81 mg at 05/19/21 [id number]   benztropine (COGENTIN) tablet 0.5 mg  0.5 mg Oral BID Damita Dunnings B, MD   0.5 mg at 05/19/21 0944   Chlorhexidine Gluconate Cloth 2 % PADS 6 each  6 each Topical Daily Allie Bossier, MD   6 each at 05/15/21 [phone]   feeding supplement (ENSURE ENLIVE / ENSURE PLUS) liquid 237 mL  237 mL Oral BID BM Dwyane Dee, MD   237 mL at 05/17/21 1844   FLUoxetine (PROZAC) capsule 20 mg  20 mg Oral Daily Damita Dunnings B, MD   20 mg at 05/19/21 0933   haloperidol (HALDOL) tablet 5 mg  5 mg Oral TID Damita Dunnings B, MD   5 mg at 05/19/21 0944   insulin aspart (novoLOG) injection 0-5 Units  0-5 Units Subcutaneous QHS Johnson, Clanford L, MD   3 Units at 05/14/21 2215   insulin aspart (novoLOG) injection 0-9 Units  0-9 Units Subcutaneous TID WC Johnson, Clanford L, MD   2 Units at 05/19/21 1214   insulin aspart (novoLOG) injection 3 Units  3 Units Subcutaneous TID WC Johnson, Clanford L, MD   3 Units at 05/19/21 1214   lactated ringers infusion   Intravenous Continuous Annye Asa, MD 10 mL/hr at 04/30/21 1255 Restarted at 04/30/21 1430   levETIRAcetam (KEPPRA) tablet 750 mg  750 mg Oral BID Johnson, Clanford L, MD   750 mg at 05/19/21 0944   lip balm (CARMEX) ointment   Topical PRN Cristal Ford, DO       LORazepam (ATIVAN) injection 2 mg  2 mg Intravenous Q6H PRN Allie Bossier, MD   2 mg at 05/11/21 2051   metoprolol tartrate (LOPRESSOR) tablet 50 mg  50 mg Oral BID Johnson, Clanford L, MD   50 mg at 05/19/21 [id number]   morphine 2 MG/ML injection 2 mg  2 mg Intravenous Q4H PRN Cristal Ford, DO   2 mg at 05/17/21 1153   multivitamin with minerals tablet 1 tablet  1 tablet Oral Daily Dwyane Dee, MD   1 tablet at 05/19/21 0933   nicotine (NICODERM CQ - dosed in mg/24 hr) patch 7 mg  7  mg Transdermal Daily Dwyane Dee, MD   7 mg at 05/19/21 0946   ondansetron (ZOFRAN) tablet 4 mg  4 mg Oral Q6H PRN Johnson,  Clanford L, MD       Or   ondansetron (ZOFRAN) injection 4 mg  4 mg Intravenous Q6H PRN Johnson, Clanford L, MD       oxybutynin (DITROPAN-XL) 24 hr tablet 5 mg  5 mg Oral QHS Johnson, Clanford L, MD   5 mg at 05/18/21 2328   oxyCODONE (Oxy IR/ROXICODONE) immediate release tablet 5 mg  5 mg Oral Q6H PRN Dwyane Dee, MD   5 mg at 05/18/21 2328   pantoprazole (PROTONIX) EC tablet 40 mg  40 mg Oral Daily Johnson, Clanford L, MD   40 mg at 05/19/21 0934   rosuvastatin (CRESTOR) tablet 10 mg  10 mg Oral Daily Johnson, Clanford L, MD   10 mg at 05/19/21 [id number]   sodium chloride flush (NS) 0.9 % injection 10-40 mL  10-40 mL Intracatheter PRN Allie Bossier, MD   10 mL at 05/17/21 2021   traZODone (DESYREL) tablet 50 mg  50 mg Oral QHS Johnson, Clanford L, MD   50 mg at 05/18/21 2328   umeclidinium bromide (INCRUSE ELLIPTA) 62.5 MCG/INH 1 puff  1 puff Inhalation Daily Danford, Suann Larry, MD   1 puff at 05/19/21 0845   zinc sulfate capsule 220 mg  220 mg Oral Daily Johnson, Clanford L, MD   220 mg at 05/19/21 [id number]    Musculoskeletal: Strength & Muscle Tone: decreased Gait & Station:  remains in bed on exam Patient leans: N/A            Psychiatric Specialty Exam:  Presentation  General Appearance: Disheveled  Eye Contact:Good  Speech:Pressured; Slurred (teeth in mouth today)  Speech Volume:Increased  Handedness: No data recorded  Mood and Affect  Mood:Irritable  Affect:Congruent   Thought Process  Thought Processes:Disorganized  Descriptions of Associations:Circumstantial  Orientation:Partial  Thought Content:Illogical  History of Schizophrenia/Schizoaffective disorder:No data recorded Duration of Psychotic Symptoms:No data recorded Hallucinations:Hallucinations: None  Ideas of Reference:Delusions  Suicidal Thoughts:Suicidal Thoughts: No  Homicidal Thoughts:Homicidal Thoughts: No   Sensorium  Memory:Immediate Fair; Recent Poor; Remote  Poor  Judgment:Impaired  Insight:None   Executive Functions  Concentration:Fair  Attention Span:Poor  Recall:Poor  Fund of Knowledge:Poor  Language:Fair   Psychomotor Activity  Psychomotor Activity:Psychomotor Activity: Normal   Assets  Assets:Resilience; Social Support   Sleep  Sleep:Sleep: Good   Physical Exam: Physical Exam Constitutional:      Comments: Swelling on the lower half of the face  HENT:     Head: Normocephalic and atraumatic.  Eyes:     Extraocular Movements: Extraocular movements intact.     Conjunctiva/sclera: Conjunctivae normal.  Cardiovascular:     Rate and Rhythm: Normal rate.  Pulmonary:     Effort: Pulmonary effort is normal.     Breath sounds: Normal breath sounds.  Abdominal:     General: Abdomen is flat.  Musculoskeletal:        General: No signs of injury.  Skin:    General: Skin is warm.  Neurological:     Mental Status: She is alert. She is disoriented.   Review of Systems  Constitutional:  Negative for chills and fever.  HENT:  Negative for hearing loss.   Eyes:  Negative for blurred vision.  Respiratory:  Negative for cough and wheezing.   Cardiovascular:  Negative for chest pain.  Gastrointestinal:  Negative for abdominal pain.  Neurological:  Negative for dizziness.  Psychiatric/Behavioral:  The patient does not have insomnia.   Blood pressure 139/88, pulse 89, temperature 99.6 F (37.6 C), resp. rate 18, height '5\' 1"'$  (1.549 m), weight 74.7 kg, last menstrual period 05/19/2016, SpO2 94 %. Body mass index is 31.12 kg/m.  Treatment Plan Summary: Daily contact with patient to assess and evaluate symptoms and progress in treatment MDD,recurrent, severe, w/ psychosis Traits of Schizoid personality Patient was more irritable today and appeared a bit more confused and endorsed some delusional thought when talking about the small plate of butter being placed on the ground over the past week. Patient's thought was  illogical and patient herself did not realize this as well. Patient is normally more redirectable. Recommendations - Consulted with Primary team who will update routine infection labs - - Continue nicotine patch '7mg'$  - Recommend scheduling treatments after 10am - Patient has a POA and has been determined to NOT HAVE CAPACITY to make her own medical decisions. Therefore must ensure that patient is receiving treatment despite her refusal, per primary. - Recommend chaplain consult, to help patient adjust to situation and patient appears to enjoy having someone to talk to as long as it is not early in the AM  - Continue Prozac '20mg'$  - Continue Haldol '5mg'$  PO TID - Continue cogentin 0.'5mg'$  BID - Will continue to follow patient Disposition: No evidence of imminent risk to self or others at present.    PGY-2 Freida Busman, MD 05/19/2021 1:25 PM

## 2021-05-19 NOTE — Progress Notes (Signed)
Called by Dr Sabino Gasser regarding options for patient's leg wounds.  She was previously offered bilateral AKAs by me about one month ago and refused.  I do not believe her wounds will heal. This was also the opinion of Dr Sharol Given.  She is not a revascularization candidate.    Please feel free to call me if she wishes to proceed with bilateral AKA  Ruta Hinds, MD [facility] Office: [phone]

## 2021-05-20 DIAGNOSIS — A419 Sepsis, unspecified organism: Secondary | ICD-10-CM | POA: Diagnosis not present

## 2021-05-20 DIAGNOSIS — I70263 Atherosclerosis of native arteries of extremities with gangrene, bilateral legs: Secondary | ICD-10-CM | POA: Diagnosis not present

## 2021-05-20 LAB — GLUCOSE, CAPILLARY
Glucose-Capillary: 134 mg/dL — ABNORMAL HIGH (ref 70–99)
Glucose-Capillary: 158 mg/dL — ABNORMAL HIGH (ref 70–99)
Glucose-Capillary: 225 mg/dL — ABNORMAL HIGH (ref 70–99)
Glucose-Capillary: 264 mg/dL — ABNORMAL HIGH (ref 70–99)
Glucose-Capillary: 303 mg/dL — ABNORMAL HIGH (ref 70–99)

## 2021-05-20 MED ORDER — ENSURE ENLIVE PO LIQD
237.0000 mL | Freq: Three times a day (TID) | ORAL | Status: DC
  Administered 2021-05-21 – 2021-05-29 (×16): 237 mL via ORAL

## 2021-05-20 NOTE — Progress Notes (Signed)
Patient refused medications on day & night shift. Eliquis, metoprolol, and keppra included. Education attempted although patient refused. When checking blood sugar, patient elicited violent threats to staff.

## 2021-05-20 NOTE — Consult Note (Addendum)
  Attempted to see patient 2 times today. On first attempt patient was allowing staff to clean her. RN reported that patient was very pleasant this AM. On second attempt patient was on the phone with her son, [name], and social security,  both on the line. Per Social security the patient's son was requesting information on the patient and the patient reported she wanted to allow him to have it. SS reported that they would not provide information unless she could confirm her address. Patient also reported over the phone that she wanted her SS check. Patient briefly spoke with provider and was overall pleasant and handed the phone to provider. Provider confirmed that patient new that she had been staying at [facility]  and provided patient the address once she stated she had been living at [facility]", which patient was able to say over the phone. Patient was very busy with the phone call and smiled and waved at the provider, but decided to continue on with the call.   RN again noted that patient had been very pleasant today and was compliant.    Will continue to follow. PGY-2 Damita Dunnings, MD

## 2021-05-20 NOTE — Progress Notes (Signed)
Progress Note    [name]   CLE:[id number]  DOB: 05/08/1966  DOA: 04/21/2021     27  PCP: Barbara Renshaw, MD  CC: leg pain  Hospital Course: [name] is a 55 yo female with PMH atrial fib on Eliquis, seizure disorder, tobacco abuse, COPD, PVD, HTN, DMII, dCHF, CKD, medical noncompliance, lack of capacity who presented with B/L leg pain from her nursing facility.  After work-up, she was ultimately found to have bilateral lower extremity leg ulcers with cellulitis, dry gangrene, and staph bacteremia.  Interval History:  Many conversations since yesterday. Essentially, patient now deemed to have capacity. Patient now consenting for B/L AKAs. [name] and [name] also are on board with surgery as recommended.  Vascular surgery has been informed and will also come re-evaluate patient to confirm.  I've talked to patient and both children this morning; everyone understands the plan.   ROS: Review of systems not obtained due to patient factors. Cognitive impairment   Assessment & Plan: * Sepsis (HCC)-resolved as of 05/13/2021 - LE gangrene as source - now s/p stent and dressing changes - see atherosclerosis   Atherosclerosis of native artery of both legs with gangrene ([place]) -vascular and orthopedic surgery consulted, she is now s/p right superficial femoral / popliteal artery stenting on 6/29 by Dr. Stanford Breed and left lower extremity angiogram with second order cannulation 7/6 -intermittently was refusing care; see decision making capacity;  - as of 7/25, psych now feels patient has capacity; furthermore the patient has told me that she is now consenting to surgery for B/L AKA as of 7/26. Both children also in agreement with decision and recommendation too - I have reached out to vascular surgery to inform of patient and family decisions and will await further confirmation from patient to surgery and further planning  Atrial fibrillation with RVR ([place]) - Continue Eliquis and  metoprolol  Patient incapable of making informed decisions-resolved as of 05/20/2021 - See psychiatry evaluation.  Patient initially clearly lacked decision-making capacity in regards to her care and disposition - as of 7/25, psychiatry has evaluated and patient now capable to explaining reasons for decisions and understands her choices - discussed at length with psychiatry, [name], and [name]  Acute renal failure superimposed on stage 3b chronic kidney disease (HCC)-resolved as of 05/13/2021 - patient has history of CKD3b. Baseline creat ~ 1.2, eGFR 35 - 39 - patient presents with increase in creat >0.3 mg/dL above baseline presumed to have occurred within past 7 days PTA - resolved with IVF   Seizure disorder ([place]) - Continue Keppra  MDD (major depressive disorder), recurrent, severe, with psychosis ([place]) - Continue Haldol, Cogentin, fluoxetine - psychiatry following, appreciate assistance - will obtain intermittent EKGs per rec's while on Haldol (QTc 472 on 7/20)  Bacteremia due to Staphylococcus-resolved as of 05/13/2021 -this was likely in the setting of multiple LE ischemic ulcers. ID consulted and followed patient, she was maintained on antibiotics which have finished on 7/14 -remains afebrile, monitor off antibiotics  GERD (gastroesophageal reflux disease) - Continue Protonix  Chronic respiratory failure with hypoxia (HCC) - Continue chronic O2  Pressure injury of skin Perineum stage I pressure injury, present on arrival Coccyx right, stage II pressure injury, present on admission  COPD (chronic obstructive pulmonary disease) (HCC) - No signs/symptoms of exacerbation  Urinary incontinence - Continue oxybutynin  Medical non-compliance - See lack of decision-making  Insomnia - Continue trazodone  DMII (diabetes mellitus, type 2) ([place]) - continue SSI and CBGs  Bleeding  hemorrhoids-resolved as of 05/13/2021 - Topical hydrocortisone; d/c  now  Hypomagnesemia-resolved as of 05/13/2021 - Repleted as needed  Hypokalemia-resolved as of 05/13/2021 - Repleted as needed   Old records reviewed in assessment of this patient  Antimicrobials:   DVT prophylaxis: apixaban (ELIQUIS) tablet 5 mg   Code Status:   Code Status: Full Code Family Communication: son and daughter   Disposition Plan: Status is: Inpatient  Remains inpatient appropriate because:Unsafe d/c plan  Dispo: The patient is from: Home              Anticipated d/c is to:  pending placement               Patient currently: now pending possible inpatient surgery for B/L AKA   Difficult to place patient Yes  Risk of unplanned readmission score: Unplanned Admission- Pilot do not use: 77.2   Objective: Blood pressure 110/73, pulse 77, temperature 98.1 F (36.7 C), temperature source Oral, resp. rate 16, height '5\' 1"'  (1.549 m), weight 74.7 kg, last menstrual period 05/19/2016, SpO2 91 %.  Examination: General appearance: alert and no distress Head: Normocephalic, without obvious abnormality, atraumatic Eyes:  EOMI Lungs: clear to auscultation bilaterally Heart: regular rate and rhythm and S1, S2 normal Abdomen: normal findings: bowel sounds normal and soft, non-tender Extremities:  LE dressings in place Skin: mobility and turgor normal Neurologic: followed commands, moves all 4 extremities  Consultants:  Psych Vascular Surgery   Procedures:    Data Reviewed: I have personally reviewed following labs and imaging studies Results for orders placed or performed during the hospital encounter of 04/21/21 (from the past 24 hour(s))  Glucose, capillary     Status: Abnormal   Collection Time: 05/19/21 11:53 AM  Result Value Ref Range   Glucose-Capillary 190 (H) 70 - 99 mg/dL  Glucose, capillary     Status: Abnormal   Collection Time: 05/19/21  3:30 PM  Result Value Ref Range   Glucose-Capillary 174 (H) 70 - 99 mg/dL  C-reactive protein     Status: Abnormal    Collection Time: 05/19/21  4:40 PM  Result Value Ref Range   CRP 3.1 (H) <1.0 mg/dL  Sedimentation rate     Status: Abnormal   Collection Time: 05/19/21  4:40 PM  Result Value Ref Range   Sed Rate 109 (H) 0 - 22 mm/hr  Glucose, capillary     Status: Abnormal   Collection Time: 05/20/21 12:00 AM  Result Value Ref Range   Glucose-Capillary 134 (H) 70 - 99 mg/dL  Glucose, capillary     Status: Abnormal   Collection Time: 05/20/21  6:14 AM  Result Value Ref Range   Glucose-Capillary 158 (H) 70 - 99 mg/dL   *Note: Due to a large number of results and/or encounters for the requested time period, some results have not been displayed. A complete set of results can be found in Results Review.    No results found for this or any previous visit (from the past 240 hour(s)).    Radiology Studies: No results found. [name] EKG SITE RITE  Final Result    HYBRID OR IMAGING ([facility] ONLY)  Final Result    [name] EKG SITE RITE  Final Result    HYBRID OR IMAGING ([place])  Final Result    [name] EKG SITE RITE  Final Result    DG Chest Port 1 View  Final Result      Scheduled Meds:  amLODipine  10 mg Oral Daily  apixaban  5 mg Oral BID   aspirin EC  81 mg Oral Daily   benztropine  0.5 mg Oral BID   Chlorhexidine Gluconate Cloth  6 each Topical Daily   feeding supplement  237 mL Oral BID BM   FLUoxetine  20 mg Oral Daily   haloperidol  5 mg Oral TID   insulin aspart  0-5 Units Subcutaneous QHS   insulin aspart  0-9 Units Subcutaneous TID WC   insulin aspart  3 Units Subcutaneous TID WC   levETIRAcetam  750 mg Oral BID   metoprolol tartrate  50 mg Oral BID   multivitamin with minerals  1 tablet Oral Daily   nicotine  7 mg Transdermal Daily   oxybutynin  5 mg Oral QHS   pantoprazole  40 mg Oral Daily   rosuvastatin  10 mg Oral Daily   traZODone  50 mg Oral QHS   umeclidinium bromide  1 puff Inhalation Daily   zinc sulfate  220 mg Oral Daily   PRN Meds: acetaminophen **OR** acetaminophen,  lip balm, LORazepam, morphine injection, ondansetron **OR** ondansetron (ZOFRAN) IV, oxyCODONE, sodium chloride flush Continuous Infusions:  lactated ringers 10 mL/hr at 04/30/21 1255     LOS: 29 days    Dwyane Dee, MD [facility] 05/20/2021, 10:36 AM

## 2021-05-20 NOTE — Consult Note (Signed)
Called by Dr Sabino Gasser earlier today.  Pt and family apparently were in agreement for bilateral AKA.  When I went to see pt she asked for identification from me which I provided.  She states she does not want any amputation of her legs at this point.  I am not sure she has capacity as her mental status seems to swing drastically.  At this point if she is deemed to be her own decision maker she is currently refusing amputation.    Please call if this changes.  Barbara Hinds, MD [facility] Office: [phone]

## 2021-05-20 NOTE — Progress Notes (Signed)
Nutrition Follow-up  DOCUMENTATION CODES:   Obesity unspecified  INTERVENTION:   -Increase Ensure Enlive po TID, each supplement provides 350 kcal and 20 grams of protein  -Continue MVI with minerals daily -Continue Magic cup TID with meals, each supplement provides 290 kcal and 9 grams of protein  -Obtained food preferences and personally entered meals for pt to help improve PO intake  NUTRITION DIAGNOSIS:   Increased nutrient needs related to wound healing as evidenced by estimated needs.  Ongoing  GOAL:   Patient will meet greater than or equal to 90% of their needs  Progressing   MONITOR:   PO intake, Supplement acceptance, Labs, Weight trends, Skin, I & O's  REASON FOR ASSESSMENT:   Low [place]    ASSESSMENT:   [name] is a 55 y.o. female with history of tobacco use disorder, severe PVD, paroxysmal atrial fibrillation currently on anticoagulant, hyperlipidemia, hypertension, uncontrolled T2DM, CHF, CKD, and more presents to the ED with a chief complaint of bilateral leg pain. Has no other complaints, no change in bowel habits. No change in urinary habits. Denies dyspnea and cough. Wears 2L oxygen at baseline.  6/29- s/p right superficial femoral and popliteal artery stenting  Spoke with pt at bedside, who reports feeling "terrible" today. She became very tearful at time of visit, stating that she was told that she may require amputations of both legs. RD provided reflective listening and emotional report. Pt shares "I don't want the surgery, but I don't want to die". Per pt, her brother is going to visit later today and encouraged her to speak about her concerns with him.   Pt complains of nausea and dislike of food. Family has been bringing outside food, but eating very little (noted full [place] and a half open bag of pretzels). RD obtained food preferences and entered meals for pt. Noted meal completions documented at 25-50%. Discussed importance  of good meal and supplement intake to promote healing. Pt shares she is drinking Ensure, but only likes the vanilla and strawberry flavors. She only likes the orange flavored Magic Cups.   Medications reviewed and include keppra and zinc sulfate.   Labs reviewed: CBGS: 134-190 (inpatient orders for glycemic control are 0-5 units insulin aspart TID with meals and 3 units inuslun aspart TID with meals).    Diet Order:   Diet Order             DIET DYS 3 Room service appropriate? Yes; Fluid consistency: Thin  Diet effective now                   EDUCATION NEEDS:   Education needs have been addressed  Skin:  Skin Assessment: Skin Integrity Issues: Skin Integrity Issues:: Other (Comment), Incisions Incisions: closed rt groin, rt and lt pretibial Other: MASD rt and lt buttocks  Last BM:  05/13/21  Height:   Ht Readings from Last 1 Encounters:  04/30/21 '5\' 1"'$  (1.549 m)    Weight:   Wt Readings from Last 1 Encounters:  05/01/21 74.7 kg    Ideal Body Weight:  47.7 kg  BMI:  Body mass index is 31.12 kg/m.  Estimated Nutritional Needs:   Kcal:  1700-1900  Protein:  95-110 grams  Fluid:  > 1.7 L    [name], RD, LDN, [place] Registered Dietitian II Certified Diabetes Care and Education Specialist Please refer to [facility] for RD and/or RD on-call/weekend/after hours pager

## 2021-05-21 ENCOUNTER — Ambulatory Visit: Payer: Medicare Other | Admitting: Physician Assistant

## 2021-05-21 DIAGNOSIS — Z9119 Patient's noncompliance with other medical treatment and regimen: Secondary | ICD-10-CM | POA: Diagnosis not present

## 2021-05-21 DIAGNOSIS — N179 Acute kidney failure, unspecified: Secondary | ICD-10-CM | POA: Diagnosis not present

## 2021-05-21 DIAGNOSIS — A419 Sepsis, unspecified organism: Secondary | ICD-10-CM

## 2021-05-21 DIAGNOSIS — I70263 Atherosclerosis of native arteries of extremities with gangrene, bilateral legs: Secondary | ICD-10-CM | POA: Diagnosis not present

## 2021-05-21 DIAGNOSIS — F333 Major depressive disorder, recurrent, severe with psychotic symptoms: Secondary | ICD-10-CM | POA: Diagnosis not present

## 2021-05-21 DIAGNOSIS — N1832 Chronic kidney disease, stage 3b: Secondary | ICD-10-CM | POA: Diagnosis not present

## 2021-05-21 LAB — GLUCOSE, CAPILLARY: Glucose-Capillary: 337 mg/dL — ABNORMAL HIGH (ref 70–99)

## 2021-05-21 MED ORDER — HYDROXYZINE HCL 25 MG PO TABS
25.0000 mg | ORAL_TABLET | Freq: Three times a day (TID) | ORAL | Status: DC
  Administered 2021-05-21 – 2021-05-25 (×9): 25 mg via ORAL
  Filled 2021-05-21 (×14): qty 1

## 2021-05-21 MED ORDER — ALTEPLASE 2 MG IJ SOLR
2.0000 mg | Freq: Once | INTRAMUSCULAR | Status: AC
  Administered 2021-05-21: 2 mg
  Filled 2021-05-21: qty 2

## 2021-05-21 NOTE — Consult Note (Signed)
D/w  bilateral AKA.  She still does not want procedure.  We discussed that her legs are non functional.  The wounds will probably never heal.  Currently her legs are  a liability to her causing pain infection and hospital visits.  We also talked about her grandchildren who would ask why she had no legs.  I also told her that her grandchildren would not want to see the larger wounds on her legs either.  She will think about whether or not to proceed and discuss with family.  I gave her our office number for her son to call if he has questions.  If she decides to proceed could possibly do operation Friday.  If she continues to delay things past today if may have to be delayed until sometime next week  Ruta Hinds, MD [facility] [place]: [phone]

## 2021-05-21 NOTE — Consult Note (Signed)
[facility] Face-to-Face Psychiatry Consult   Reason for Consult:  Mood dysregulation Referring Physician:  Nolberto Hanlon, MD Patient Identification: FANTASIA NEWGENT MRN:  [id number] Principal Diagnosis: Sepsis [facility]) Diagnosis:  Active Problems:   DMII (diabetes mellitus, type 2) ([place])   Essential hypertension   Insomnia   Medical non-compliance   Poor mobility   HTN (hypertension), malignant   Diabetic neuropathy ([place])   MDD (major depressive disorder), recurrent, severe, with psychosis ([place])   Urinary incontinence   COPD (chronic obstructive pulmonary disease) (HCC)   PAF (paroxysmal atrial fibrillation) (HCC)   Atrial fibrillation with RVR ([place])   Pressure injury of skin   Seizure disorder ([place])   Sacral ulcer, limited to breakdown of skin ([place])   Multiple duodenal ulcers   Chronic respiratory failure with hypoxia ([place])   Atherosclerosis of native artery of both legs with gangrene ([place])   GERD (gastroesophageal reflux disease)   Total Time spent with patient: 15 minutes  Subjective:   AIME ENER is a 55 y.o. female patient admitted with bilateral leg pain w/ PMH of depression, tobacco use disorder, severe PVD, paroxysmal aticoagulant, HLD, HTN, uncontrolled T2DM, CHF, CKD.  HPI:  On assessment this AM patient is awake in her bed in a dark room. Patient is receptive to having the TV turned on. Provider ask patient why patient suddenly changed their mind when the vascular surgeon came to assess for consent. Patient reported that she was scared at the thought of losing her legs. Patient and provider discussed what could happened if she does not get Bil AKA and that the ultimate result could be death. Patient reported she did not want to die. Patient reports that she thought "when they put the pins in my legs that fixed everything" and indicates that she is still in shock about learning that she needs amputations. Patient does not endorse SI, HI, nor AVH.   Past Psychiatric History:   Reports OP psych 4-5 visits total. Also report being dx with "Clinical depression" Recalls being on haldol for depression. Recalls taking Paxil and Prozac in the past.   Of note patient was not taking her Keppra prior to hospitalization despite having a seizure in 07/2020.   Risk to Self:  NO Risk to Others:  NO Prior Inpatient Therapy:  NO Prior Outpatient Therapy:  YES  Past Medical History:  Past Medical History:  Diagnosis Date   Alcohol use    Ankle fracture, lateral malleolus, closed 2013   Anxiety    Aortic atherosclerosis ([place]) 03/10/2021   Breast mass, left 2013   CHF (congestive heart failure) ([place])    a. EF 20-25% by echo in 05/2016 with cath showing normal cors b. EF 50-55% in 07/2020 c. 01/2021: EF at 55-60% with moderate LVH   Chronic anemia    CKD (chronic kidney disease), stage III (HCC)    Cocaine abuse (HCC)    COPD (chronic obstructive pulmonary disease) ([place])    Diabetes mellitus, type 2 ([place])    Diabetic Charcot foot ([place])    Essential hypertension    History of cardiomyopathy    History of GI bleed 2011   Hyperlipidemia    Noncompliance    Obesity    PAF (paroxysmal atrial fibrillation) (HCC)    Panic attacks    PAT (paroxysmal atrial tachycardia) (HCC)    Previously on Amiodarone   Seizures ([place]) 10/01/2020   Sleep apnea    Not on CPAP   Stroke ([place]) 2018   Tobacco abuse  Urinary incontinence     Past Surgical History:  Procedure Laterality Date   ANGIOPLASTY ILLIAC ARTERY Right 04/23/2021   Procedure: ANGIOPLASTY AND STENT SUPERFICIAL FEMORAL ARTERY;  Surgeon: Cherre Robins, MD;  Location: [place];  Service: Vascular;  Laterality: Right;   AORTOGRAM  04/23/2021   Procedure: AORTOGRAM;  Surgeon: Cherre Robins, MD;  Location: [place];  Service: Vascular;;   AORTOGRAM Left 04/30/2021   Procedure: left lower extremity angiogram with second order cannulation;  Surgeon: Cherre Robins, MD;  Location: [place];  Service: Vascular;  Laterality: Left;    BIOPSY  11/12/2020   Procedure: BIOPSY;  Surgeon: Harvel Quale, MD;  [facility];  Service: Gastroenterology;;   BREAST BIOPSY     CARDIAC CATHETERIZATION N/A 07/28/2016   Procedure: Left Heart Cath and Coronary Angiography;  Surgeon: Jettie Booze, MD;  Location: [place] [facility];  Service: Cardiovascular;  Laterality: N/A;   COLONOSCOPY N/A 05/10/2019   Procedure: COLONOSCOPY;  Surgeon: Danie Binder, MD;  [facility];  Service: Endoscopy;  Laterality: N/A;  Phenergan 12.5 mg IV in pre-op   DILATION AND CURETTAGE OF UTERUS     ESOPHAGOGASTRODUODENOSCOPY (EGD) WITH PROPOFOL N/A 11/12/2020   Procedure: ESOPHAGOGASTRODUODENOSCOPY (EGD) WITH PROPOFOL;  Surgeon: Harvel Quale, MD;  [facility];  Service: Gastroenterology;  Laterality: N/A;   I & D EXTREMITY Bilateral 09/22/2017   Procedure: BILATERAL DEBRIDEMENT LEG/FOOT ULCERS, APPLY VERAFLO WOUND VAC;  Surgeon: Newt Minion, MD;  Location: [place];  Service: Orthopedics;  Laterality: Bilateral;   I & D EXTREMITY Right 10/11/2018   Procedure: IRRIGATION AND DEBRIDEMENT RIGHT HAND;  Surgeon: [name], MD;  Location: [place];  Service: Orthopedics;  Laterality: Right;   I & D EXTREMITY Right 10/13/2018   Procedure: REPEAT IRRIGATION AND DEBRIDEMENT RIGHT HAND;  Surgeon: [name], MD;  Location: [place];  Service: Orthopedics;  Laterality: Right;   I & D EXTREMITY Right 11/22/2018   Procedure: IRRIGATION AND DEBRIDEMENT AND PINNING RIGHT HAND;  Surgeon: [name], MD;  Location: [place];  Service: Orthopedics;  Laterality: Right;   IR RADIOLOGIST EVAL & MGMT  07/05/2018   LOWER EXTREMITY ANGIOGRAM Bilateral 04/23/2021   Procedure: RIGHT  AND LEFT LOWER EXTREMITY ANGIOGRAM;  Surgeon: Cherre Robins, MD;  Location: [place];  Service: Vascular;  Laterality: Bilateral;   POLYPECTOMY  05/10/2019   Procedure: POLYPECTOMY;  Surgeon: Danie Binder, MD;  [facility];   Service: Endoscopy;;   SKIN SPLIT GRAFT Bilateral 09/28/2017   Procedure: BILATERAL SPLIT THICKNESS SKIN GRAFT LEGS/FEET AND APPLY VAC;  Surgeon: Newt Minion, MD;  Location: [place];  Service: Orthopedics;  Laterality: Bilateral;   SKIN SPLIT GRAFT Right 11/22/2018   Procedure: SKIN GRAFT SPLIT THICKNESS;  Surgeon: [name], MD;  Location: [place];  Service: Orthopedics;  Laterality: Right;   Family History:  Family History  Problem Relation Age of Onset   Hypertension Mother    Heart attack Mother    Hypertension Father        CABG    Hypertension Sister    Hypertension Brother    Hypertension Sister    Cancer Sister        breast    Arthritis Other    Cancer Other    Diabetes Other    Asthma Other    Hypertension Daughter    Hypertension Son    Family Psychiatric  History: None known  Social History:  Social History   Substance and Sexual Activity  Alcohol Use Not Currently   Alcohol/week: 0.0 standard drinks   Comment: Quit in 2017     Social History   Substance and Sexual Activity  Drug Use Not Currently   Frequency: 3.0 times per week   Types: Marijuana, Cocaine   Comment: none since August 2018    Social History   Socioeconomic History   Marital status: Widowed    Spouse name: Not on file   Number of children: 2   Years of education: 9   Highest education level: 9th grade  Occupational History   Occupation: Disabled  Tobacco Use   Smoking status: Some Days    Packs/day: 0.25    Years: 20.00    Pack years: 5.00    Types: Cigarettes    Last attempt to quit: 06/03/2017    Years since quitting: 3.9   Smokeless tobacco: Never  Vaping Use   Vaping Use: Never used  Substance and Sexual Activity   Alcohol use: Not Currently    Alcohol/week: 0.0 standard drinks    Comment: Quit in 2017   Drug use: Not Currently    Frequency: 3.0 times per week    Types: Marijuana, Cocaine    Comment: none since August 2018   Sexual activity: Not on file  Other  Topics Concern   Not on file  Social History Narrative   Right handed   Drinks 1-2 cups caffeine daily   Social Determinants of Health   Financial Resource Strain: Medium Risk   Difficulty of Paying Living Expenses: Somewhat hard  Food Insecurity: Food Insecurity Present   Worried About Charity fundraiser in the Last Year: Sometimes true   Ran Out of Food in the Last Year: Sometimes true  Transportation Needs: No Transportation Needs   Lack of Transportation (Medical): No   Lack of Transportation (Non-Medical): No  Physical Activity: Insufficiently Active   Days of Exercise per Week: 7 days   Minutes of Exercise per Session: 20 min  Stress: Stress Concern Present   Feeling of Stress : To some extent  Social Connections: Moderately Isolated   Frequency of Communication with Friends and Family: More than three times a week   Frequency of Social Gatherings with Friends and Family: More than three times a week   Attends Religious Services: More than 4 times per year   Active Member of [employer] or Organizations: No   Attends Archivist Meetings: Never   Marital Status: Widowed   Additional Social History:    Allergies:  No Known Allergies  Labs:  Results for orders placed or performed during the hospital encounter of 04/21/21 (from the past 48 hour(s))  Glucose, capillary     Status: Abnormal   Collection Time: 05/19/21  3:30 PM  Result Value Ref Range   Glucose-Capillary 174 (H) 70 - 99 mg/dL    Comment: Glucose reference range applies only to samples taken after fasting for at least 8 hours.  C-reactive protein     Status: Abnormal   Collection Time: 05/19/21  4:40 PM  Result Value Ref Range   CRP 3.1 (H) <1.0 mg/dL    Comment: Performed at [facility], [place] [address], [place], [place] [address]  Sedimentation rate     Status: Abnormal   Collection Time: 05/19/21  4:40 PM  Result Value Ref Range   Sed Rate 109 (H) 0 - 22 mm/hr  Comment: Performed at  [facility], [place] [address], [place], [place] [address]  Glucose, capillary     Status: Abnormal   Collection Time: 05/20/21 12:00 AM  Result Value Ref Range   Glucose-Capillary 134 (H) 70 - 99 mg/dL    Comment: Glucose reference range applies only to samples taken after fasting for at least 8 hours.  Glucose, capillary     Status: Abnormal   Collection Time: 05/20/21  6:14 AM  Result Value Ref Range   Glucose-Capillary 158 (H) 70 - 99 mg/dL    Comment: Glucose reference range applies only to samples taken after fasting for at least 8 hours.  Glucose, capillary     Status: Abnormal   Collection Time: 05/20/21 11:21 AM  Result Value Ref Range   Glucose-Capillary 264 (H) 70 - 99 mg/dL    Comment: Glucose reference range applies only to samples taken after fasting for at least 8 hours.  Glucose, capillary     Status: Abnormal   Collection Time: 05/20/21  5:10 PM  Result Value Ref Range   Glucose-Capillary 303 (H) 70 - 99 mg/dL    Comment: Glucose reference range applies only to samples taken after fasting for at least 8 hours.  Glucose, capillary     Status: Abnormal   Collection Time: 05/20/21  8:18 PM  Result Value Ref Range   Glucose-Capillary 225 (H) 70 - 99 mg/dL    Comment: Glucose reference range applies only to samples taken after fasting for at least 8 hours.   *Note: Due to a large number of results and/or encounters for the requested time period, some results have not been displayed. A complete set of results can be found in Results Review.    Current Facility-Administered Medications  Medication Dose Route Frequency Provider Last Rate Last Admin   acetaminophen (TYLENOL) tablet 650 mg  650 mg Oral Q6H PRN Johnson, Clanford L, MD   650 mg at 05/15/21 0535   Or   acetaminophen (TYLENOL) suppository 650 mg  650 mg Rectal Q6H PRN Johnson, Clanford L, MD       amLODipine (NORVASC) tablet 10 mg  10 mg Oral Daily Johnson, Clanford L, MD   10 mg at 05/21/21 1006   apixaban  (ELIQUIS) tablet 5 mg  5 mg Oral BID Edwin Dada, MD   5 mg at 05/21/21 1004   aspirin EC tablet 81 mg  81 mg Oral Daily Dagoberto Ligas, PA-C   81 mg at 05/21/21 1004   benztropine (COGENTIN) tablet 0.5 mg  0.5 mg Oral BID Damita Dunnings B, MD   0.5 mg at 05/21/21 1005   Chlorhexidine Gluconate Cloth 2 % PADS 6 each  6 each Topical Daily Allie Bossier, MD   6 each at 05/21/21 1037   feeding supplement (ENSURE ENLIVE / ENSURE PLUS) liquid 237 mL  237 mL Oral TID BM Dwyane Dee, MD   237 mL at 05/21/21 1431   FLUoxetine (PROZAC) capsule 20 mg  20 mg Oral Daily Damita Dunnings B, MD   20 mg at 05/21/21 1004   haloperidol (HALDOL) tablet 5 mg  5 mg Oral TID Damita Dunnings B, MD   5 mg at 05/21/21 1006   hydrOXYzine (ATARAX/VISTARIL) tablet 25 mg  25 mg Oral TID Damita Dunnings B, MD       insulin aspart (novoLOG) injection 0-5 Units  0-5 Units Subcutaneous QHS Wynetta Emery, Clanford L, MD   2 Units at 05/20/21 2105   insulin  aspart (novoLOG) injection 0-9 Units  0-9 Units Subcutaneous TID WC Johnson, Clanford L, MD   7 Units at 05/20/21 1715   insulin aspart (novoLOG) injection 3 Units  3 Units Subcutaneous TID WC Johnson, Clanford L, MD   3 Units at 05/19/21 1644   lactated ringers infusion   Intravenous Continuous Annye Asa, MD 10 mL/hr at 04/30/21 1255 Restarted at 04/30/21 1430   levETIRAcetam (KEPPRA) tablet 750 mg  750 mg Oral BID Johnson, Clanford L, MD   750 mg at 05/21/21 1004   lip balm (CARMEX) ointment   Topical PRN Cristal Ford, DO       LORazepam (ATIVAN) injection 2 mg  2 mg Intravenous Q6H PRN Allie Bossier, MD   2 mg at 05/11/21 2051   metoprolol tartrate (LOPRESSOR) tablet 50 mg  50 mg Oral BID Wynetta Emery, Clanford L, MD   50 mg at 05/21/21 1006   morphine 2 MG/ML injection 2 mg  2 mg Intravenous Q4H PRN Cristal Ford, DO   2 mg at 05/17/21 1153   multivitamin with minerals tablet 1 tablet  1 tablet Oral Daily Dwyane Dee, MD   1 tablet at 05/21/21 1004    nicotine (NICODERM CQ - dosed in mg/24 hr) patch 7 mg  7 mg Transdermal Daily Dwyane Dee, MD   7 mg at 05/21/21 1007   ondansetron (ZOFRAN) tablet 4 mg  4 mg Oral Q6H PRN Johnson, Clanford L, MD       Or   ondansetron (ZOFRAN) injection 4 mg  4 mg Intravenous Q6H PRN Johnson, Clanford L, MD       oxybutynin (DITROPAN-XL) 24 hr tablet 5 mg  5 mg Oral QHS Johnson, Clanford L, MD   5 mg at 05/20/21 2104   oxyCODONE (Oxy IR/ROXICODONE) immediate release tablet 5 mg  5 mg Oral Q6H PRN Dwyane Dee, MD   5 mg at 05/20/21 0122   pantoprazole (PROTONIX) EC tablet 40 mg  40 mg Oral Daily Johnson, Clanford L, MD   40 mg at 05/21/21 1003   rosuvastatin (CRESTOR) tablet 10 mg  10 mg Oral Daily Johnson, Clanford L, MD   10 mg at 05/21/21 1004   sodium chloride flush (NS) 0.9 % injection 10-40 mL  10-40 mL Intracatheter PRN Allie Bossier, MD   20 mL at 05/20/21 0836   traZODone (DESYREL) tablet 50 mg  50 mg Oral QHS Johnson, Clanford L, MD   50 mg at 05/20/21 2104   umeclidinium bromide (INCRUSE ELLIPTA) 62.5 MCG/INH 1 puff  1 puff Inhalation Daily Danford, Suann Larry, MD   1 puff at 05/19/21 0845   zinc sulfate capsule 220 mg  220 mg Oral Daily Johnson, Clanford L, MD   220 mg at 05/21/21 1005    Musculoskeletal: Strength & Muscle Tone: decreased Gait & Station:  remains in bed Patient leans: N/A            Psychiatric Specialty Exam:  Presentation  General Appearance: Appropriate for Environment  Eye Contact:Fair  Speech:Clear and Coherent  Speech Volume:Normal  Handedness: No data recorded  Mood and Affect  Mood:Irritable; Anxious  Affect:Congruent   Thought Process  Thought Processes:Coherent  Descriptions of Associations:Intact  Orientation:Partial  Thought Content:Logical  History of Schizophrenia/Schizoaffective disorder:No data recorded Duration of Psychotic Symptoms:No data recorded Hallucinations:Hallucinations: None  Ideas of  Reference:None  Suicidal Thoughts:Suicidal Thoughts: No  Homicidal Thoughts:Homicidal Thoughts: No   Sensorium  Memory:Immediate Good; Recent Poor; Remote Poor  Judgment:Impaired  Insight:Shallow  Executive Functions  Concentration:Fair  Attention Span:Fair  Recall:Poor  [employer] of Knowledge:Poor  Language:Fair   Psychomotor Activity  Psychomotor Activity:Psychomotor Activity: Decreased   Assets  Assets:Resilience; Social Support   Sleep  Sleep:Sleep: Good   Physical Exam: Physical Exam Constitutional:      Appearance: Normal appearance.  HENT:     Head: Normocephalic and atraumatic.  Eyes:     Extraocular Movements: Extraocular movements intact.     Conjunctiva/sclera: Conjunctivae normal.  Cardiovascular:     Rate and Rhythm: Normal rate.  Pulmonary:     Effort: Pulmonary effort is normal.     Breath sounds: Normal breath sounds.  Abdominal:     General: Abdomen is flat.  Musculoskeletal:        General: No signs of injury.  Skin:    General: Skin is warm.  Neurological:     Mental Status: She is alert. Mental status is at baseline.   Review of Systems  Constitutional:  Negative for chills and fever.  HENT:  Negative for hearing loss.   Eyes:  Negative for blurred vision.  Respiratory:  Negative for cough and wheezing.   Cardiovascular:  Negative for chest pain.  Gastrointestinal:  Negative for abdominal pain.  Neurological:  Negative for dizziness.  Psychiatric/Behavioral:  The patient is nervous/anxious.   Blood pressure 128/90, pulse 80, temperature 99.1 F (37.3 C), temperature source Oral, resp. rate 16, height '5\' 1"'$  (1.549 m), weight 74.7 kg, last menstrual period 05/19/2016, SpO2 98 %. Body mass index is 31.12 kg/m.  Treatment Plan Summary: Daily contact with patient to assess and evaluate symptoms and progress in treatment MDD,recurrent, severe, w/ psychosis Traits of Schizoid personality Grief Patient appears to be both  adjusting and grieving the thought of Bil AKA. Patient was initially in denial and has multiple times exclaimed that she was going to get up and walk out of the hospital like she has in the past. Patient has made it known that she is spiritual and has made it known that when others tell her that ambulation is unlikely, she gets upset and believes they are wishing negative on her. ON exam this morning patient was open that she was anxious at the thought of losing her legs and was still processing why the amputation has been recommended. It it is likely that patient will need to go through stages of grief before finally reaching acceptance with her diagnosis.  - Atarax '25mg'$  TID to aid with the anxiety  Continue nicotine patch '7mg'$  - Recommend scheduling treatments after 10am - Recommend chaplain re-consult, to help patient adjust to situation and patient appears to enjoy having someone to talk to as long as it is not early in the AM  - Continue Prozac '20mg'$  - Continue Haldol '5mg'$  PO TID - Continue cogentin 0.'5mg'$  BID -Will continue to follow patient Disposition: Supportive therapy provided about ongoing stressors. PGY-2 Freida Busman, MD 05/21/2021 2:55 PM

## 2021-05-21 NOTE — Plan of Care (Signed)
Patient refused night time meds and vital signs. Attending notified.

## 2021-05-21 NOTE — Progress Notes (Signed)
Progress Note    [name]   YIR:[id number]  DOB: Jun 18, 1966  DOA: 04/21/2021     30  PCP: Caprice Renshaw, MD  CC: leg pain  Hospital Course: Ms. Chatham is a 55 yo female with PMH atrial fib on Eliquis, seizure disorder, tobacco abuse, COPD, PVD, HTN, DMII, dCHF, CKD, medical noncompliance who presented with B/L leg pain from her nursing facility.  After work-up, she was ultimately found to have bilateral lower extremity leg ulcers with cellulitis, dry gangrene, and staph bacteremia.  See below for further extensive problem based plan and course.   Interval History:  Again more discussions since yesterday. She told myself "yes" to surgery yesterday morning. She since told Dr. Oneida Alar "no" both yesterday and again this morning pending further discussions.  I talked to her 3 times total this morning. Alone, with Dr. Stanford Breed, and again alone after that. All 3 times she is now consenting "yes" to surgery. We informed her of tentative surgery being planned for this Friday and she was in agreement and voiced understanding to me/us.  I then called and informed [name] and [name] both personally whom also understand the plan. They were asking if I knew what time surgery was, but I told them I did not know yet but would try and have the nurse call when we do know.   ROS: Review of systems not obtained due to patient factors. Cognitive impairment   Assessment & Plan: * Sepsis (HCC)-resolved as of 05/13/2021 - LE gangrene as source - now s/p stent and dressing changes - see atherosclerosis   Atherosclerosis of native artery of both legs with gangrene ([place]) -vascular and orthopedic surgery consulted, she is now s/p right superficial femoral / popliteal artery stenting on 6/29 by Dr. Stanford Breed and left lower extremity angiogram with second order cannulation 7/6 -intermittently was refusing care; see decision making capacity;  - as of 7/25, psych now feels patient has capacity;  - after SEVERAL  discussions with patient, family and vascular surgery, patient now consenting for surgery on 7/27 (to myself and Dr. Stanford Breed present); tentative plan is to post for surgery on 7/29. I have called and talked to Chad to inform them of surgery tentatively on Friday and to continue supporting their mom in her decision; of course the possibility stands that she may "change her mind" which will again raise concern for her ability to retain capacity (this may very well be too large of a decision for her to understand the risks/benefits) but at this time she does understand the surgery is above knee amputations and is consenting  Atrial fibrillation with RVR ([place]) - Continue Eliquis and metoprolol  Patient incapable of making informed decisions-resolved as of 05/20/2021 - See psychiatry evaluation.  Patient initially clearly lacked decision-making capacity in regards to her care and disposition - as of 7/25, psychiatry has evaluated and patient now capable to explaining reasons for decisions and understands her choices - discussed at length with psychiatry, [name], and Rosalyn Gess: [phone] [name]: [phone]  Acute renal failure superimposed on stage 3b chronic kidney disease (HCC)-resolved as of 05/13/2021 - patient has history of CKD3b. Baseline creat ~ 1.2, eGFR 35 - 39 - patient presents with increase in creat >0.3 mg/dL above baseline presumed to have occurred within past 7 days PTA - resolved with IVF  Seizure disorder ([place]) - Continue Keppra  MDD (major depressive disorder), recurrent, severe, with psychosis ([place]) - Continue Haldol, Cogentin, fluoxetine - psychiatry following, appreciate assistance -  will obtain intermittent EKGs per rec's while on Haldol (QTc 472 on 7/20) - repeat EKG on 7/27 to follow up QTc  Bacteremia due to Staphylococcus-resolved as of 05/13/2021 -this was likely in the setting of multiple LE ischemic ulcers. ID consulted and followed patient,  she was maintained on antibiotics which have finished on 7/14 -remains afebrile, monitor off antibiotics  GERD (gastroesophageal reflux disease) - Continue Protonix  Chronic respiratory failure with hypoxia (HCC) - Continue chronic O2  Pressure injury of skin Perineum stage I pressure injury, present on arrival Coccyx right, stage II pressure injury, present on admission  COPD (chronic obstructive pulmonary disease) (HCC) - No signs/symptoms of exacerbation  Urinary incontinence - Continue oxybutynin  Medical non-compliance - See lack of decision-making  Insomnia - Continue trazodone  DMII (diabetes mellitus, type 2) ([place]) - continue SSI and CBGs  Bleeding hemorrhoids-resolved as of 05/13/2021 - Topical hydrocortisone; d/c now  Hypomagnesemia-resolved as of 05/13/2021 - Repleted as needed  Hypokalemia-resolved as of 05/13/2021 - Repleted as needed   Old records reviewed in assessment of this patient  Antimicrobials:   DVT prophylaxis: apixaban (ELIQUIS) tablet 5 mg   Code Status:   Code Status: Full Code Family Communication: son and daughter   Disposition Plan: Status is: Inpatient  Remains inpatient appropriate because:Unsafe d/c plan  Dispo: The patient is from: Home              Anticipated d/c is to:  pending placement               Patient currently: now pending possible inpatient surgery for B/L AKA, tentative for 7/29   Difficult to place patient Yes  Risk of unplanned readmission score: Unplanned Admission- Pilot do not use: 77.78   Objective: Blood pressure 128/90, pulse 80, temperature 99.1 F (37.3 C), temperature source Oral, resp. rate 16, height 5' 1" (1.549 m), weight 74.7 kg, last menstrual period 05/19/2016, SpO2 98 %.  Examination: General appearance: alert and no distress Head: Normocephalic, without obvious abnormality, atraumatic Eyes:  EOMI Lungs: clear to auscultation bilaterally Heart: regular rate and rhythm and S1, S2  normal Abdomen: normal findings: bowel sounds normal and soft, non-tender Extremities:  LE dressings in place Skin: mobility and turgor normal Neurologic: followed commands, moves all 4 extremities  Consultants:  Psych Vascular Surgery   Procedures:    Data Reviewed: I have personally reviewed following labs and imaging studies Results for orders placed or performed during the hospital encounter of 04/21/21 (from the past 24 hour(s))  Glucose, capillary     Status: Abnormal   Collection Time: 05/20/21  5:10 PM  Result Value Ref Range   Glucose-Capillary 303 (H) 70 - 99 mg/dL  Glucose, capillary     Status: Abnormal   Collection Time: 05/20/21  8:18 PM  Result Value Ref Range   Glucose-Capillary 225 (H) 70 - 99 mg/dL   *Note: Due to a large number of results and/or encounters for the requested time period, some results have not been displayed. A complete set of results can be found in Results Review.    No results found for this or any previous visit (from the past 240 hour(s)).    Radiology Studies: No results found. [name] EKG SITE RITE  Final Result    HYBRID OR IMAGING ([place] ONLY)  Final Result    [name] EKG SITE RITE  Final Result    HYBRID OR IMAGING ([place])  Final Result    [name] EKG SITE RITE  Final Result    DG Chest Port 1 View  Final Result      Scheduled Meds:  amLODipine  10 mg Oral Daily   apixaban  5 mg Oral BID   aspirin EC  81 mg Oral Daily   benztropine  0.5 mg Oral BID   Chlorhexidine Gluconate Cloth  6 each Topical Daily   feeding supplement  237 mL Oral TID BM   FLUoxetine  20 mg Oral Daily   haloperidol  5 mg Oral TID   hydrOXYzine  25 mg Oral TID   insulin aspart  0-5 Units Subcutaneous QHS   insulin aspart  0-9 Units Subcutaneous TID WC   insulin aspart  3 Units Subcutaneous TID WC   levETIRAcetam  750 mg Oral BID   metoprolol tartrate  50 mg Oral BID   multivitamin with minerals  1 tablet Oral Daily   nicotine  7 mg Transdermal Daily    oxybutynin  5 mg Oral QHS   pantoprazole  40 mg Oral Daily   rosuvastatin  10 mg Oral Daily   traZODone  50 mg Oral QHS   umeclidinium bromide  1 puff Inhalation Daily   zinc sulfate  220 mg Oral Daily   PRN Meds: acetaminophen **OR** acetaminophen, lip balm, LORazepam, morphine injection, ondansetron **OR** ondansetron (ZOFRAN) IV, oxyCODONE, sodium chloride flush Continuous Infusions:  lactated ringers 10 mL/hr at 04/30/21 1255     LOS: 30 days  Time spent: Greater than 50% of the 35 minute visit was spent in counseling/coordination of care for the patient as laid out in the A&P.   Dwyane Dee, MD [facility] 05/21/2021, 12:57 PM

## 2021-05-21 NOTE — Plan of Care (Signed)

## 2021-05-22 DIAGNOSIS — A419 Sepsis, unspecified organism: Secondary | ICD-10-CM | POA: Diagnosis not present

## 2021-05-22 DIAGNOSIS — F333 Major depressive disorder, recurrent, severe with psychotic symptoms: Secondary | ICD-10-CM | POA: Diagnosis not present

## 2021-05-22 DIAGNOSIS — Z9119 Patient's noncompliance with other medical treatment and regimen: Secondary | ICD-10-CM | POA: Diagnosis not present

## 2021-05-22 LAB — GLUCOSE, CAPILLARY
Glucose-Capillary: 164 mg/dL — ABNORMAL HIGH (ref 70–99)
Glucose-Capillary: 194 mg/dL — ABNORMAL HIGH (ref 70–99)
Glucose-Capillary: 231 mg/dL — ABNORMAL HIGH (ref 70–99)
Glucose-Capillary: 208 mg/dL — ABNORMAL HIGH (ref 70–99)

## 2021-05-22 MED ORDER — APIXABAN 5 MG PO TABS
5.0000 mg | ORAL_TABLET | Freq: Two times a day (BID) | ORAL | Status: DC
  Administered 2021-05-22 – 2021-05-24 (×3): 5 mg via ORAL
  Filled 2021-05-22 (×5): qty 1

## 2021-05-22 NOTE — Plan of Care (Signed)

## 2021-05-22 NOTE — TOC Progression Note (Signed)
Transition of Care [facility]) - Progression Note    Patient Details  Name: [name] MRN: [id number] Date of Birth: [birth year]  [facility] CM/SW Contact  Milinda Antis, [place] Phone Number: 05/22/2021, 5:47 PM  Clinical Narrative:    CSW spoke with [name] at [facility] and was informed that the facility is not going to accept the patient back to the facility.  CSW will continue to explore other LTC facilities.          Expected Discharge Plan and Services                                                 Social Determinants of Health (SDOH) Interventions    Readmission Risk Interventions Readmission Risk Prevention Plan 10/31/2020 08/27/2020  Transportation Screening Complete Complete  Medication Review Press photographer) Complete Complete  PCP or Specialist appointment within 3-5 days of discharge - Complete  HRI or [place] - Complete  SW Recovery Care/Counseling Consult Complete Complete  Palliative Care Screening Not Applicable Not Applicable  Skilled Nursing Facility Complete Complete  Some recent data might be hidden

## 2021-05-22 NOTE — Progress Notes (Signed)
[facility] has attempted to change patient's PICC dressing x3 separate times today.  Each time patient has requested that we return later but is declining at this point.  IV Team will follow up and try to change dressing on 7-29 if patient is agreeable.

## 2021-05-22 NOTE — Consult Note (Signed)
[facility] Face-to-Face Psychiatry Consult   Reason for Consult:  Mood dysregulation Referring Physician:  Nolberto Hanlon, MD Patient Identification: [name] MRN:  [id number] Principal Diagnosis: Sepsis [facility]) Diagnosis:  Active Problems:   DMII (diabetes mellitus, type 2) ([place])   Essential hypertension   Insomnia   Medical non-compliance   Poor mobility   HTN (hypertension), malignant   Diabetic neuropathy ([place])   MDD (major depressive disorder), recurrent, severe, with psychosis ([place])   Urinary incontinence   COPD (chronic obstructive pulmonary disease) (HCC)   PAF (paroxysmal atrial fibrillation) (HCC)   Atrial fibrillation with RVR ([place])   Pressure injury of skin   Seizure disorder ([place])   Sacral ulcer, limited to breakdown of skin ([place])   Multiple duodenal ulcers   Chronic respiratory failure with hypoxia ([place])   Atherosclerosis of native artery of both legs with gangrene ([place])   GERD (gastroesophageal reflux disease)   Total Time spent with patient: 15 minutes  Subjective:   [name] is a 55 y.o. female patient admitted with bilateral leg pain w/ PMH of depression, tobacco use disorder, severe PVD, paroxysmal aticoagulant, HLD, HTN, uncontrolled T2DM, CHF, CKD.  HPI:  On assessment this AM patient is alert and allows her blinds to be open. Patient reports that her mood is "not good" because she has wet the bed and has not been cleaned and has not requested assistance, but she reports that she would like to be cleaned. Patient reports that she has made the decision not to go through with amputation of her legs and she has made peace with this decision at this time. Patient does not endorse SI, HI, nor AVH.  Past Psychiatric History:  Reports OP psych 4-5 visits total. Also report being dx with "Clinical depression" Recalls being on haldol for depression. Recalls taking Paxil and Prozac in the past.   Of note patient was not taking her Keppra prior to hospitalization  despite having a seizure in 07/2020.   Risk to Self:  NO Risk to Others:  NO Prior Inpatient Therapy:  NO Prior Outpatient Therapy:  YES Past Medical History:  Past Medical History:  Diagnosis Date   Alcohol use    Ankle fracture, lateral malleolus, closed 2013   Anxiety    Aortic atherosclerosis ([place]) 03/10/2021   Breast mass, left 2013   CHF (congestive heart failure) ([place])    a. EF 20-25% by echo in 05/2016 with cath showing normal cors b. EF 50-55% in 07/2020 c. 01/2021: EF at 55-60% with moderate LVH   Chronic anemia    CKD (chronic kidney disease), stage III (HCC)    Cocaine abuse (HCC)    COPD (chronic obstructive pulmonary disease) ([place])    Diabetes mellitus, type 2 ([place])    Diabetic Charcot foot ([place])    Essential hypertension    History of cardiomyopathy    History of GI bleed 2011   Hyperlipidemia    Noncompliance    Obesity    PAF (paroxysmal atrial fibrillation) (HCC)    Panic attacks    PAT (paroxysmal atrial tachycardia) (HCC)    Previously on Amiodarone   Seizures ([place]) 10/01/2020   Sleep apnea    Not on CPAP   Stroke ([place]) 2018   Tobacco abuse    Urinary incontinence     Past Surgical History:  Procedure Laterality Date   ANGIOPLASTY ILLIAC ARTERY Right 04/23/2021   Procedure: ANGIOPLASTY AND STENT SUPERFICIAL FEMORAL ARTERY;  Surgeon: Cherre Robins, MD;  Location:  [place] OR;  Service: Vascular;  Laterality: Right;   AORTOGRAM  04/23/2021   Procedure: AORTOGRAM;  Surgeon: Cherre Robins, MD;  Location: [place];  Service: Vascular;;   AORTOGRAM Left 04/30/2021   Procedure: left lower extremity angiogram with second order cannulation;  Surgeon: Cherre Robins, MD;  Location: [place];  Service: Vascular;  Laterality: Left;   BIOPSY  11/12/2020   Procedure: BIOPSY;  Surgeon: Harvel Quale, MD;  [facility];  Service: Gastroenterology;;   BREAST BIOPSY     CARDIAC CATHETERIZATION N/A 07/28/2016   Procedure: Left Heart Cath and Coronary  Angiography;  Surgeon: Jettie Booze, MD;  Location: [name] [facility];  Service: Cardiovascular;  Laterality: N/A;   COLONOSCOPY N/A 05/10/2019   Procedure: COLONOSCOPY;  Surgeon: Danie Binder, MD;  [facility];  Service: Endoscopy;  Laterality: N/A;  Phenergan 12.5 mg IV in pre-op   DILATION AND CURETTAGE OF UTERUS     ESOPHAGOGASTRODUODENOSCOPY (EGD) WITH PROPOFOL N/A 11/12/2020   Procedure: ESOPHAGOGASTRODUODENOSCOPY (EGD) WITH PROPOFOL;  Surgeon: Harvel Quale, MD;  [facility];  Service: Gastroenterology;  Laterality: N/A;   I & D EXTREMITY Bilateral 09/22/2017   Procedure: BILATERAL DEBRIDEMENT LEG/FOOT ULCERS, APPLY VERAFLO WOUND VAC;  Surgeon: Newt Minion, MD;  Location: [place];  Service: Orthopedics;  Laterality: Bilateral;   I & D EXTREMITY Right 10/11/2018   Procedure: IRRIGATION AND DEBRIDEMENT RIGHT HAND;  Surgeon: [name], MD;  Location: [place];  Service: Orthopedics;  Laterality: Right;   I & D EXTREMITY Right 10/13/2018   Procedure: REPEAT IRRIGATION AND DEBRIDEMENT RIGHT HAND;  Surgeon: [name], MD;  Location: [place];  Service: Orthopedics;  Laterality: Right;   I & D EXTREMITY Right 11/22/2018   Procedure: IRRIGATION AND DEBRIDEMENT AND PINNING RIGHT HAND;  Surgeon: [name], MD;  Location: [place];  Service: Orthopedics;  Laterality: Right;   IR RADIOLOGIST EVAL & MGMT  07/05/2018   LOWER EXTREMITY ANGIOGRAM Bilateral 04/23/2021   Procedure: RIGHT  AND LEFT LOWER EXTREMITY ANGIOGRAM;  Surgeon: Cherre Robins, MD;  Location: [place];  Service: Vascular;  Laterality: Bilateral;   POLYPECTOMY  05/10/2019   Procedure: POLYPECTOMY;  Surgeon: Danie Binder, MD;  [facility];  Service: Endoscopy;;   SKIN SPLIT GRAFT Bilateral 09/28/2017   Procedure: BILATERAL SPLIT THICKNESS SKIN GRAFT LEGS/FEET AND APPLY VAC;  Surgeon: Newt Minion, MD;  Location: [place];  Service: Orthopedics;  Laterality: Bilateral;   SKIN  SPLIT GRAFT Right 11/22/2018   Procedure: SKIN GRAFT SPLIT THICKNESS;  Surgeon: [name], MD;  Location: [place];  Service: Orthopedics;  Laterality: Right;   Family History:  Family History  Problem Relation Age of Onset   Hypertension Mother    Heart attack Mother    Hypertension Father        CABG    Hypertension Sister    Hypertension Brother    Hypertension Sister    Cancer Sister        breast    Arthritis Other    Cancer Other    Diabetes Other    Asthma Other    Hypertension Daughter    Hypertension Son    Family Psychiatric  History: Unknown Social History:  Social History   Substance and Sexual Activity  Alcohol Use Not Currently   Alcohol/week: 0.0 standard drinks   Comment: Quit in 2017     Social History   Substance and  Sexual Activity  Drug Use Not Currently   Frequency: 3.0 times per week   Types: Marijuana, Cocaine   Comment: none since August 2018    Social History   Socioeconomic History   Marital status: Widowed    Spouse name: Not on file   Number of children: 2   Years of education: 9   Highest education level: 9th grade  Occupational History   Occupation: Disabled  Tobacco Use   Smoking status: Some Days    Packs/day: 0.25    Years: 20.00    Pack years: 5.00    Types: Cigarettes    Last attempt to quit: 06/03/2017    Years since quitting: 3.9   Smokeless tobacco: Never  Vaping Use   Vaping Use: Never used  Substance and Sexual Activity   Alcohol use: Not Currently    Alcohol/week: 0.0 standard drinks    Comment: Quit in 2017   Drug use: Not Currently    Frequency: 3.0 times per week    Types: Marijuana, Cocaine    Comment: none since August 2018   Sexual activity: Not on file  Other Topics Concern   Not on file  Social History Narrative   Right handed   Drinks 1-2 cups caffeine daily   Social Determinants of Health   Financial Resource Strain: Medium Risk   Difficulty of Paying Living Expenses: Somewhat hard  Food  Insecurity: Food Insecurity Present   Worried About Charity fundraiser in the Last Year: Sometimes true   Ran Out of Food in the Last Year: Sometimes true  Transportation Needs: No Transportation Needs   Lack of Transportation (Medical): No   Lack of Transportation (Non-Medical): No  Physical Activity: Insufficiently Active   Days of Exercise per Week: 7 days   Minutes of Exercise per Session: 20 min  Stress: Stress Concern Present   Feeling of Stress : To some extent  Social Connections: Moderately Isolated   Frequency of Communication with Friends and Family: More than three times a week   Frequency of Social Gatherings with Friends and Family: More than three times a week   Attends Religious Services: More than 4 times per year   Active Member of [employer] or Organizations: No   Attends Archivist Meetings: Never   Marital Status: Widowed   Additional Social History:    Allergies:  No Known Allergies  Labs:  Results for orders placed or performed during the hospital encounter of 04/21/21 (from the past 48 hour(s))  Glucose, capillary     Status: Abnormal   Collection Time: 05/20/21  5:10 PM  Result Value Ref Range   Glucose-Capillary 303 (H) 70 - 99 mg/dL    Comment: Glucose reference range applies only to samples taken after fasting for at least 8 hours.  Glucose, capillary     Status: Abnormal   Collection Time: 05/20/21  8:18 PM  Result Value Ref Range   Glucose-Capillary 225 (H) 70 - 99 mg/dL    Comment: Glucose reference range applies only to samples taken after fasting for at least 8 hours.  Glucose, capillary     Status: Abnormal   Collection Time: 05/21/21  8:22 PM  Result Value Ref Range   Glucose-Capillary 337 (H) 70 - 99 mg/dL    Comment: Glucose reference range applies only to samples taken after fasting for at least 8 hours.  Glucose, capillary     Status: Abnormal   Collection Time: 05/22/21  6:47 AM  Result  Value Ref Range   Glucose-Capillary 164  (H) 70 - 99 mg/dL    Comment: Glucose reference range applies only to samples taken after fasting for at least 8 hours.  Glucose, capillary     Status: Abnormal   Collection Time: 05/22/21 11:10 AM  Result Value Ref Range   Glucose-Capillary 194 (H) 70 - 99 mg/dL    Comment: Glucose reference range applies only to samples taken after fasting for at least 8 hours.   *Note: Due to a large number of results and/or encounters for the requested time period, some results have not been displayed. A complete set of results can be found in Results Review.    Current Facility-Administered Medications  Medication Dose Route Frequency Provider Last Rate Last Admin   acetaminophen (TYLENOL) tablet 650 mg  650 mg Oral Q6H PRN Johnson, Clanford L, MD   650 mg at 05/15/21 0535   Or   acetaminophen (TYLENOL) suppository 650 mg  650 mg Rectal Q6H PRN Johnson, Clanford L, MD       amLODipine (NORVASC) tablet 10 mg  10 mg Oral Daily Johnson, Clanford L, MD   10 mg at 05/22/21 0959   apixaban (ELIQUIS) tablet 5 mg  5 mg Oral BID Lavina Hamman, MD       aspirin EC tablet 81 mg  81 mg Oral Daily Dagoberto Ligas, PA-C   81 mg at 05/22/21 [id number]   benztropine (COGENTIN) tablet 0.5 mg  0.5 mg Oral BID Damita Dunnings B, MD   0.5 mg at 05/22/21 1000   Chlorhexidine Gluconate Cloth 2 % PADS 6 each  6 each Topical Daily Allie Bossier, MD   6 each at 05/22/21 1000   feeding supplement (ENSURE ENLIVE / ENSURE PLUS) liquid 237 mL  237 mL Oral TID BM Dwyane Dee, MD   237 mL at 05/22/21 1000   FLUoxetine (PROZAC) capsule 20 mg  20 mg Oral Daily Damita Dunnings B, MD   20 mg at 05/22/21 0959   haloperidol (HALDOL) tablet 5 mg  5 mg Oral TID Damita Dunnings B, MD   5 mg at 05/22/21 1000   hydrOXYzine (ATARAX/VISTARIL) tablet 25 mg  25 mg Oral TID Damita Dunnings B, MD   25 mg at 05/22/21 0959   insulin aspart (novoLOG) injection 0-5 Units  0-5 Units Subcutaneous QHS Johnson, Clanford L, MD   4 Units at 05/21/21 2346   insulin  aspart (novoLOG) injection 0-9 Units  0-9 Units Subcutaneous TID WC Johnson, Clanford L, MD   2 Units at 05/22/21 1230   insulin aspart (novoLOG) injection 3 Units  3 Units Subcutaneous TID WC Johnson, Clanford L, MD   3 Units at 05/22/21 1231   lactated ringers infusion   Intravenous Continuous Annye Asa, MD 10 mL/hr at 04/30/21 1255 Restarted at 04/30/21 1430   levETIRAcetam (KEPPRA) tablet 750 mg  750 mg Oral BID Johnson, Clanford L, MD   750 mg at 05/22/21 [id number]   lip balm (CARMEX) ointment   Topical PRN Cristal Ford, DO       LORazepam (ATIVAN) injection 2 mg  2 mg Intravenous Q6H PRN Allie Bossier, MD   2 mg at 05/11/21 2051   metoprolol tartrate (LOPRESSOR) tablet 50 mg  50 mg Oral BID Johnson, Clanford L, MD   50 mg at 05/22/21 0959   morphine 2 MG/ML injection 2 mg  2 mg Intravenous Q4H PRN Cristal Ford, DO   2 mg at 05/17/21 1153  multivitamin with minerals tablet 1 tablet  1 tablet Oral Daily Dwyane Dee, MD   1 tablet at 05/22/21 [id number]   nicotine (NICODERM CQ - dosed in mg/24 hr) patch 7 mg  7 mg Transdermal Daily Dwyane Dee, MD   7 mg at 05/22/21 1005   ondansetron (ZOFRAN) tablet 4 mg  4 mg Oral Q6H PRN Johnson, Clanford L, MD       Or   ondansetron (ZOFRAN) injection 4 mg  4 mg Intravenous Q6H PRN Johnson, Clanford L, MD       oxybutynin (DITROPAN-XL) 24 hr tablet 5 mg  5 mg Oral QHS Johnson, Clanford L, MD   5 mg at 05/20/21 2104   oxyCODONE (Oxy IR/ROXICODONE) immediate release tablet 5 mg  5 mg Oral Q6H PRN Dwyane Dee, MD   5 mg at 05/20/21 0122   pantoprazole (PROTONIX) EC tablet 40 mg  40 mg Oral Daily Johnson, Clanford L, MD   40 mg at 05/22/21 0959   rosuvastatin (CRESTOR) tablet 10 mg  10 mg Oral Daily Johnson, Clanford L, MD   10 mg at 05/21/21 1004   sodium chloride flush (NS) 0.9 % injection 10-40 mL  10-40 mL Intracatheter PRN Allie Bossier, MD   20 mL at 05/20/21 0836   traZODone (DESYREL) tablet 50 mg  50 mg Oral QHS Johnson, Clanford L, MD    50 mg at 05/20/21 2104   umeclidinium bromide (INCRUSE ELLIPTA) 62.5 MCG/INH 1 puff  1 puff Inhalation Daily Danford, Suann Larry, MD   1 puff at 05/22/21 [id number]   zinc sulfate capsule 220 mg  220 mg Oral Daily Johnson, Clanford L, MD   220 mg at 05/22/21 [id number]    Musculoskeletal: Strength & Muscle Tone: decreased Gait & Station:  remains in bed Patient leans: N/A            Psychiatric Specialty Exam:  Presentation  General Appearance: Appropriate for Environment  Eye Contact:Good  Speech:Clear and Coherent  Speech Volume:Normal  Handedness: No data recorded  Mood and Affect  Mood:Dysphoric  Affect:Congruent   Thought Process  Thought Processes:Coherent  Descriptions of Associations:Intact  Orientation:Partial  Thought Content:Logical  History of Schizophrenia/Schizoaffective disorder:No data recorded Duration of Psychotic Symptoms:No data recorded Hallucinations:Hallucinations: None  Ideas of Reference:None  Suicidal Thoughts:Suicidal Thoughts: No  Homicidal Thoughts:Homicidal Thoughts: No   Sensorium  Memory:Immediate Good; Remote Fair; Recent Fair  Judgment:Intact  Insight:Shallow   Executive Functions  Concentration:Good  Attention Span:Good  Congress of Knowledge:Poor  Language:Fair   Psychomotor Activity  Psychomotor Activity:Psychomotor Activity: Normal   Assets  Assets:Social Support; Resilience   Sleep  Sleep:Sleep: Fair   Physical Exam: Physical Exam Constitutional:      Appearance: Normal appearance.  HENT:     Head: Normocephalic and atraumatic.  Eyes:     Extraocular Movements: Extraocular movements intact.     Conjunctiva/sclera: Conjunctivae normal.  Cardiovascular:     Rate and Rhythm: Normal rate.  Pulmonary:     Effort: Pulmonary effort is normal.     Breath sounds: Normal breath sounds.  Abdominal:     General: Abdomen is flat.  Musculoskeletal:        General: No swelling.   Skin:    General: Skin is warm and dry.  Neurological:     Mental Status: She is alert. Mental status is at baseline.   Review of Systems  Constitutional:  Negative for chills and fever.  HENT:  Negative for hearing loss.  Eyes:  Negative for blurred vision.  Respiratory:  Negative for cough and wheezing.   Cardiovascular:  Negative for chest pain.  Gastrointestinal:  Negative for abdominal pain.  Neurological:  Negative for dizziness.  Psychiatric/Behavioral:  Negative for suicidal ideas.   Blood pressure (!) 131/111, pulse 81, temperature 98.7 F (37.1 C), temperature source Oral, resp. rate 16, height '5\' 1"'$  (1.549 m), weight 74.7 kg, last menstrual period 05/19/2016, SpO2 94 %. Body mass index is 31.12 kg/m.  Treatment Plan Summary: Medication management MDD,recurrent, severe, w/ psychosis Traits of Schizoid personality Grief Patient appears to be in more of a place of "acceptance" of her diagnosis today and has decided that she would rather take the inherent risk with keeping her legs and forgoing to recommended Bil AKA. Patient remains psychiatrically stable and continues to have more affect than earlier in her hospitalization and is taking more of an active role in her care including better compliance.  - Atarax '25mg'$  TID to aid with the anxiety  - Continue nicotine patch '7mg'$  - Recommend scheduling treatments after 10am  - Continue Prozac '20mg'$  - Continue Haldol '5mg'$  PO TID - Continue cogentin 0.'5mg'$  BID Disposition: No evidence of imminent risk to self or others at present.    PGY-2 Freida Busman, MD 05/22/2021 2:59 PM

## 2021-05-22 NOTE — Progress Notes (Addendum)
In to see pt this morning.  Sleeping but wakes easily.   Discussed with her that she had stated yesterday with Dr. Sabino Gasser and Dr. Stanford Breed that she was willing to proceed with amputation.  When I asked her about that this morning, she stated "I've changed my mind, I'm not gonna have it".  If she does decide to proceed, will have to be off Eliquis x 48 hours and her last dose was last night so even if she does decide to proceed, would have to be next week at this point due to her anticoagulation.   Leontine Locket, [facility] 05/22/2021 7:23 AM   Agree with above.  I spoke with the patient this morning as well.  She stated to me that she "wants a miracle pill".  She states that she also has talked with her brother and her brother encouraged her that new technology is developing every day.  She states that she does not want the amputations at this point and wants to keep both of her legs.  I discussed the case with Dr. Stanford Breed this morning as well.  At this point will sign off unless patient wishes to proceed at some point in the future.  Please reconsult as necessary.  Otherwise will not follow actively.  Ruta Hinds, MD [facility] Office: [phone]

## 2021-05-22 NOTE — Progress Notes (Signed)
Progress Note    [name]   WGN:[id number]  DOB: [birth year]  DOA: 04/21/2021     31  PCP: Caprice Renshaw, MD  CC: leg pain  Hospital Course: [name] is a 55 yo female with PMH atrial fib on Eliquis, seizure disorder, tobacco abuse, COPD, PVD, HTN, DMII, dCHF, CKD, medical noncompliance who presented with B/L leg pain from her nursing facility.  After work-up, she was ultimately found to have bilateral lower extremity leg ulcers with cellulitis, dry gangrene, and staph bacteremia.  See below for further extensive problem based plan and course.   Interval History:  Refuses surgery in the morning.  My initial conversation patient was nonverbal.  I brought in the nose and patient started talking.  On conversation in the presence of son, son thinks that the patient might have misunderstood the surgery conversation but patient continues to refuse surgery in his presence.  Discussed with daughter on the phone at [phone].  Patient was sleeping comfortably at that point.  ROS: Review of systems not obtained due to patient factors. 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 Stanford Breed and left lower extremity angiogram with second order cannulation 7/6 -intermittently was refusing care; see decision making capacity;  - as of 7/25, psych now feels patient has capacity;  -Refuses surgery again.  Vascular surgery signed off Plan will be to continue dressing changes and have a family meeting on Saturday 6 PM to discuss goals of care.  5 family members would like to come, 2 sister, brother, daughter and son   Atrial fibrillation with RVR ([place]) - Continue Eliquis and metoprolol    Patient incapable of making informed decisions-resolved as of 05/20/2021 - See psychiatry evaluation.  Patient initially lacked decision-making capacity in regards to her care and disposition per psych. - as of 7/25, psychiatry has evaluated and patient now capable to explaining reasons for decisions and understands her choices - discussed at length with psychiatry, [name] and Rosalyn Gess: [phone] [name]: [phone]   Acute renal failure superimposed on stage 3b chronic kidney disease (HCC)-resolved as of 05/13/2021 - patient has history of CKD3b. Baseline creat ~ 1.2, eGFR 35 - 39 - patient presents with increase in creat >0.3 mg/dL above baseline presumed to have occurred within past 7 days PTA - resolved with IVF   Seizure disorder ([place]) - Continue Keppra   MDD (major depressive disorder), recurrent, severe, with psychosis ([place]) - Continue Haldol, Cogentin, fluoxetine - psychiatry following, appreciate assistance - will obtain intermittent EKGs per rec's while on Haldol (QTc 472 on 7/20) - repeat EKG on 7/27 to follow up QTc   Bacteremia due to Staphylococcus-resolved as of 05/13/2021 -this was likely in the setting of multiple LE ischemic ulcers. ID consulted and followed patient, she was maintained on antibiotics which have finished on 7/14 -remains afebrile, monitor off antibiotics   GERD (gastroesophageal reflux disease) - Continue Protonix   Chronic respiratory failure with hypoxia (HCC) - Continue chronic O2   Pressure injury of skin Perineum stage I pressure injury, present on arrival Coccyx right, stage II pressure injury, present on admission   COPD (chronic obstructive pulmonary disease) (HCC) - No signs/symptoms of exacerbation  Urinary incontinence - Continue oxybutynin   Medical non-compliance - See lack of decision-making   Insomnia - Continue trazodone   DMII (diabetes mellitus, type 2) (HCC) - continue SSI and CBGs   Bleeding  hemorrhoids-resolved as of 05/13/2021 - Topical hydrocortisone; d/c now   Hypomagnesemia-resolved as of 05/13/2021 - Repleted as needed   Hypokalemia-resolved as of 05/13/2021 - Repleted as needed  Old records reviewed in assessment of this patient  Antimicrobials:   DVT prophylaxis: apixaban (ELIQUIS) tablet 5 mg   Code Status:   Code Status: Full Code Family Communication: son and daughter   Disposition Plan: Status is: Inpatient  Remains inpatient appropriate because:Unsafe d/c plan  Dispo: The patient is from: Home              Anticipated d/c is to:  pending placement                Difficult to place patient Yes  Risk of unplanned readmission score: Unplanned Admission- Pilot do not use: 71.1   Objective: Blood pressure (!) 131/111, pulse 81, temperature 98.7 F (37.1 C), temperature source Oral, resp. rate 16, height '5\' 1"'  (1.549 m), weight 74.7 kg, last menstrual period 05/19/2016, SpO2 94 %.  Examination: General: Appear in mild distress; no visible Abnormal Neck Mass Or lumps, Conjunctiva normal Cardiovascular: S1 and S2 Present, no Murmur, Respiratory: good respiratory effort, Bilateral Air entry present and CTA, no Crackles, no wheezes Abdomen: Bowel Sound present Extremities: bilateral trace Pedal edema Neurology: alert and oriented to place, and person Gait not checked due to patient safety concerns   Consultants:  Psych Vascular Surgery   Procedures:    Data Reviewed: I have personally reviewed following labs and imaging studies Results for orders placed or performed during the hospital encounter of 04/21/21 (from the past 24 hour(s))  Glucose, capillary     Status: Abnormal   Collection Time: 05/21/21  8:22 PM  Result Value Ref Range   Glucose-Capillary 337 (H) 70 - 99 mg/dL  Glucose, capillary     Status: Abnormal   Collection Time: 05/22/21  6:47 AM  Result Value Ref Range   Glucose-Capillary 164 (H) 70 - 99 mg/dL  Glucose, capillary      Status: Abnormal   Collection Time: 05/22/21 11:10 AM  Result Value Ref Range   Glucose-Capillary 194 (H) 70 - 99 mg/dL  Glucose, capillary     Status: Abnormal   Collection Time: 05/22/21  5:22 PM  Result Value Ref Range   Glucose-Capillary 231 (H) 70 - 99 mg/dL   *Note: Due to a large number of results and/or encounters for the requested time period, some results have not been displayed. A complete set of results can be found in Results Review.    No results found for this or any previous visit (from the past 240 hour(s)).    Radiology Studies: No results found. [name] EKG SITE RITE  Final Result    HYBRID OR IMAGING ([facility] ONLY)  Final Result    [name] EKG SITE RITE  Final Result    HYBRID OR IMAGING ([facility] ONLY)  Final Result    [name] EKG SITE RITE  Final Result    DG Chest Port 1 View  Final Result      Scheduled Meds:  amLODipine  10 mg Oral Daily   apixaban  5 mg Oral BID   aspirin EC  81 mg Oral Daily   benztropine  0.5 mg Oral BID   Chlorhexidine Gluconate Cloth  6 each Topical Daily   feeding supplement  237 mL Oral TID BM   FLUoxetine  20 mg Oral Daily   haloperidol  5 mg Oral TID   hydrOXYzine  25 mg Oral TID   insulin aspart  0-5 Units Subcutaneous QHS   insulin aspart  0-9 Units Subcutaneous TID WC   insulin aspart  3 Units Subcutaneous TID WC   levETIRAcetam  750 mg Oral BID   metoprolol tartrate  50 mg Oral BID   multivitamin with minerals  1 tablet Oral Daily   nicotine  7 mg Transdermal Daily   oxybutynin  5 mg Oral QHS   pantoprazole  40 mg Oral Daily   rosuvastatin  10 mg Oral Daily   traZODone  50 mg Oral QHS   umeclidinium bromide  1 puff Inhalation Daily   zinc sulfate  220 mg Oral Daily   PRN Meds: acetaminophen **OR** acetaminophen, lip balm, LORazepam, morphine injection, ondansetron **OR** ondansetron (ZOFRAN) IV, oxyCODONE, sodium chloride flush Continuous Infusions:  lactated ringers 10 mL/hr at 04/30/21 1255     LOS: 31 days  Time spent:  35 minutes   Berle Mull, MD [facility] 05/22/2021, 7:59 PM

## 2021-05-23 DIAGNOSIS — Z9119 Patient's noncompliance with other medical treatment and regimen: Secondary | ICD-10-CM | POA: Diagnosis not present

## 2021-05-23 DIAGNOSIS — F333 Major depressive disorder, recurrent, severe with psychotic symptoms: Secondary | ICD-10-CM | POA: Diagnosis not present

## 2021-05-23 LAB — GLUCOSE, CAPILLARY
Glucose-Capillary: 137 mg/dL — ABNORMAL HIGH (ref 70–99)
Glucose-Capillary: 224 mg/dL — ABNORMAL HIGH (ref 70–99)
Glucose-Capillary: 311 mg/dL — ABNORMAL HIGH (ref 70–99)

## 2021-05-23 MED ORDER — OXYCODONE HCL 5 MG PO TABS
5.0000 mg | ORAL_TABLET | Freq: Four times a day (QID) | ORAL | Status: DC | PRN
  Administered 2021-05-23 – 2021-05-25 (×4): 5 mg via ORAL
  Filled 2021-05-23 (×4): qty 1

## 2021-05-23 MED ORDER — LORAZEPAM 0.5 MG PO TABS: 0.5000 mg | ORAL_TABLET | Freq: Four times a day (QID) | ORAL | Status: DC | PRN

## 2021-05-23 MED ORDER — MORPHINE SULFATE (PF) 2 MG/ML IV SOLN: 2.0000 mg | INTRAVENOUS | Status: DC | PRN

## 2021-05-23 NOTE — Progress Notes (Signed)
[facility] Progress Note  Patient: [name]    BLT:[id number]  DOA: 04/21/2021     Date of Service: the patient was seen and examined on 05/23/2021  Brief hospital course: Past medical history of chronic A. fib on Eliquis, seizure, tobacco abuse, COPD, PVD, HTN, type II DM, HFpEF, CKD 3B.  Presented with bilateral leg pain from her nursing facility.  Found to have worsening PVD and underwent stenting on 6/29. Also found to have MSSA bacteremia for which he received IV antibiotics. Recommendation is to perform bilateral BKA. Patient unable to make a decision. Psychiatry was consulted.  Initially felt that the patient does not have any capacity to make medical decision but later on psychiatry felt that the patient does have capacity to make medical decision.  Currently plan is made with family, understand goal of care and work on safe discharge plan.  Subjective: Reports pain.  No nausea no vomiting.  No fever no chills.  No diarrhea no constipation.  Does not like the food that she is getting right now.  Assessment and Plan: 1.  Sepsis present on admission, secondary to lower leg cellulitis and gangrene secondary to staph capitis bacteremia Met SIRS criteria on admission with leukocytosis, tachypnea and tachycardia. ID was consulted. Treated with vancomycin.  Completed therapy on 7/14. Currently CRP significantly better.  Sepsis physiology resolved.  2.  Peripheral vascular disease with gangrene bilateral lower extremity Vascular surgery was consulted. Underwent aortogram on 6/29 with right superficial femoral/popliteal artery stenting. Left lower extremity angiogram on 7/6. Vascular surgery recommends bilateral BKA for this patient as there is no option for revascularization for her bilateral gangrene. Patient vacillating between decision of going through the surgery and not going to the surgery. Currently vascular surgery has signed off. Continue current dressing changes  although invariably this will lead to further infection and sepsis.  3.  Major depressive disorder, recurrent, severe with psychosis Traits of schizoid personality Grief Psychiatry was consulted for capacity evaluation. Initially patient was felt to be lacking capacity to understand medical condition and decision making. On 7/25 psychiatry on reevaluation felt that the patient does have capacity to make medical decision and continues to believe that the patient has capacity. Psychiatry currently signed off. Continuing Atarax 25 mg 3 times daily for anxiety Nicotine patch Prozac 20 mg daily.  Haldol 5 mg 3 times daily.  Cogentin 0.5 mg twice daily. Outpatient psychiatry follow-up recommended as well.  4.  Chronic A. fib, RVR HTN HFpEF On Eliquis and metoprolol.  We will continue. Blood pressure stable pretension continue Norvasc.  5.  CKD 3B Baseline serum creatinine 1.4.  On presentation serum creatinine remained the same although during therapy in the hospital serum creatinine has improved and normalized. Could have AKI although does not need criteria.  6.  History of seizure disorder Continue Keppra 750 mg twice daily. Patient intermittently noncompliant with his medication.  7.  GERD. Continuing PPI.  8.  Chronic respiratory failure with hypoxia. COPD Continuing chronic O2.  Does not appear to have any exacerbation.  9.  Poor dentition. Patient will follow-up with dentistry outpatient. Patient was placed on dysphagia 3 diet for ease of intake. Will continue regular diet for now as patient is unhappy with her current regimen in the hospital.  10.  Type 2 diabetes mellitus, uncontrolled with hyperglycemia.  With long-term insulin use and PVD and CKD. Hemoglobin A1c 11.1 on 5/29. Currently on sliding scale insulin.  11.  HLD. Continuing statin.  12.  Anemia of chronic disease Associated with her gangrene. No active bleeding Monitor.  Scheduled Meds:  amLODipine   10 mg Oral Daily   apixaban  5 mg Oral BID   aspirin EC  81 mg Oral Daily   benztropine  0.5 mg Oral BID   Chlorhexidine Gluconate Cloth  6 each Topical Daily   feeding supplement  237 mL Oral TID BM   FLUoxetine  20 mg Oral Daily   haloperidol  5 mg Oral TID   hydrOXYzine  25 mg Oral TID   insulin aspart  0-5 Units Subcutaneous QHS   insulin aspart  0-9 Units Subcutaneous TID WC   insulin aspart  3 Units Subcutaneous TID WC   levETIRAcetam  750 mg Oral BID   metoprolol tartrate  50 mg Oral BID   multivitamin with minerals  1 tablet Oral Daily   nicotine  7 mg Transdermal Daily   oxybutynin  5 mg Oral QHS   pantoprazole  40 mg Oral Daily   rosuvastatin  10 mg Oral Daily   traZODone  50 mg Oral QHS   umeclidinium bromide  1 puff Inhalation Daily   zinc sulfate  220 mg Oral Daily   Continuous Infusions:  lactated ringers 10 mL/hr at 04/30/21 1255   PRN Meds: acetaminophen **OR** acetaminophen, lip balm, LORazepam, morphine injection, ondansetron **OR** ondansetron (ZOFRAN) IV, oxyCODONE, sodium chloride flush  Body mass index is 31.12 kg/m.  Nutrition Problem: Increased nutrient needs Etiology: wound healing Pressure Injury 12/03/20 Perineum Posterior Stage 1 -  Intact skin with non-blanchable redness of a localized area usually over a bony prominence. (Active)  12/03/20 2210  Location: Perineum  Location Orientation: Posterior  Staging: Stage 1 -  Intact skin with non-blanchable redness of a localized area usually over a bony prominence.  Wound Description (Comments):   Present on Admission: Yes     Pressure Injury 12/04/20 Coccyx Right;Medial Stage 2 -  Partial thickness loss of dermis presenting as a shallow open injury with a red, pink wound bed without slough. 1cm x 0.5cm area to the right of previously healed pressure ulcer (Active)  12/04/20 0800  Location: Coccyx  Location Orientation: Right;Medial  Staging: Stage 2 -  Partial thickness loss of dermis presenting as a  shallow open injury with a red, pink wound bed without slough.  Wound Description (Comments): 1cm x 0.5cm area to the right of previously healed pressure ulcer  Present on Admission: Yes     DVT Prophylaxis:   Place and maintain sequential compression device Start: 05/06/21 1002 apixaban (ELIQUIS) tablet 5 mg    Advance goals of care discussion: Pt is Full code.  Family Communication: no family was present at bedside, at the time of interview.  Family meeting for goals of care conversation on 7/30 at 6 PM.  Data Reviewed: I have personally reviewed and interpreted daily labs, tele strips, imaging. CBG well controlled.  Physical Exam:  General: Appear in mild distress, no Rash; Oral Mucosa Clear, moist. no Abnormal Neck Mass Or lumps, Conjunctiva normal  Cardiovascular: S1 and S2 Present, no Murmur, Respiratory: good respiratory effort, Bilateral Air entry present and CTA, no Crackles, no wheezes Abdomen: Bowel Sound present, Soft and no tenderness Extremities: trace Pedal edema Neurology: alert and oriented to place, and person affect appropriate. no new focal deficit Gait not checked due to patient safety concerns  Vitals:   05/22/21 1114 05/22/21 2012 05/23/21 0733 05/23/21 1237  BP: (!) 131/111 114/86 (!) 134/92 117/89  Pulse:  81 81 88 (!) 49  Resp: '16 14 16 20  ' Temp: 98.7 F (37.1 C) (!) 97.5 F (36.4 C) 98.3 F (36.8 C) 98.6 F (37 C)  TempSrc: Oral Oral Oral Oral  SpO2: 94% 100% 98% (!) 88%  Weight:      Height:        Disposition:  Status is: Inpatient  Remains inpatient appropriate because:Unsafe d/c plan  Dispo: The patient is from: Home              Anticipated d/c is to: SNF              Patient currently is not medically stable to d/c.   Difficult to place patient No        Time spent: 35 minutes. I reviewed all nursing notes, pharmacy notes, vitals, pertinent old records. I have discussed plan of care as described above with  RN.  Author: Berle Mull, MD [facility] 05/23/2021 7:12 PM  To reach On-call, see care teams to locate the attending and reach out via [email or web address]. Between 7PM-7AM, please contact night-coverage If you still have difficulty reaching the attending provider, please page the [facility] (Director on Call) for [facility] on amion for assistance.

## 2021-05-23 NOTE — Plan of Care (Signed)

## 2021-05-23 NOTE — Consult Note (Signed)
[facility] Face-to-Face Psychiatry Consult   Reason for Consult:  Mood dysregulation Referring Physician:  Nolberto Hanlon, MD Patient Identification: [name] MRN:  [id number] Principal Diagnosis: Sepsis [facility]) Diagnosis:  Active Problems:   DMII (diabetes mellitus, type 2) ([place])   Essential hypertension   Insomnia   Medical non-compliance   Poor mobility   HTN (hypertension), malignant   Diabetic neuropathy ([place])   MDD (major depressive disorder), recurrent, severe, with psychosis ([place])   Urinary incontinence   COPD (chronic obstructive pulmonary disease) (HCC)   PAF (paroxysmal atrial fibrillation) (HCC)   Atrial fibrillation with RVR ([place])   Pressure injury of skin   Seizure disorder ([place])   Sacral ulcer, limited to breakdown of skin ([place])   Multiple duodenal ulcers   Chronic respiratory failure with hypoxia ([place])   Atherosclerosis of native artery of both legs with gangrene ([place])   GERD (gastroesophageal reflux disease)   Total Time spent with patient: 15 minutes  Subjective:   [name] is a 55 y.o. female patient admitted with bilateral leg pain w/ PMH of depression, tobacco use disorder, severe PVD, paroxysmal aticoagulant, HLD, HTN, uncontrolled T2DM, CHF, CKD.    HPI:  On assessment this AM patient is up early with her glasses on and watching television. Patient reports she is doing ok this AM and reports she is ready to leave the hospital. Patient remains clear that she does not wish to have surgery and would rather have faith in [place] to heal her. Patient indicates that she thinks having surgery would indicate that she has lost faith in her religion at this time. Patient is satisfied with her decision. Patient is otherwise compliant and eating and sleeping well. Patient denies SI, HI, and AVH.     Past Psychiatric History:  Reports OP psych 4-5 visits total. Also report being dx with "Clinical depression" Recalls being on haldol for depression. Recalls taking Paxil  and Prozac in the past.   Of note patient was not taking her Keppra prior to hospitalization despite having a seizure in 07/2020.   Risk to Self:  NO Risk to Others:  NO Prior Inpatient Therapy:  NO Prior Outpatient Therapy:  YES Past Medical History:   Past Medical History:  Past Medical History:  Diagnosis Date   Alcohol use    Ankle fracture, lateral malleolus, closed 2013   Anxiety    Aortic atherosclerosis ([place]) 03/10/2021   Breast mass, left 2013   CHF (congestive heart failure) ([place])    a. EF 20-25% by echo in 05/2016 with cath showing normal cors b. EF 50-55% in 07/2020 c. 01/2021: EF at 55-60% with moderate LVH   Chronic anemia    CKD (chronic kidney disease), stage III (HCC)    Cocaine abuse (HCC)    COPD (chronic obstructive pulmonary disease) ([place])    Diabetes mellitus, type 2 ([place])    Diabetic Charcot foot ([place])    Essential hypertension    History of cardiomyopathy    History of GI bleed 2011   Hyperlipidemia    Noncompliance    Obesity    PAF (paroxysmal atrial fibrillation) (HCC)    Panic attacks    PAT (paroxysmal atrial tachycardia) (HCC)    Previously on Amiodarone   Seizures ([place]) 10/01/2020   Sleep apnea    Not on CPAP   Stroke ([place]) 2018   Tobacco abuse    Urinary incontinence     Past Surgical History:  Procedure Laterality Date   ANGIOPLASTY ILLIAC  ARTERY Right 04/23/2021   Procedure: ANGIOPLASTY AND STENT SUPERFICIAL FEMORAL ARTERY;  Surgeon: Cherre Robins, MD;  Location: [place];  Service: Vascular;  Laterality: Right;   AORTOGRAM  04/23/2021   Procedure: AORTOGRAM;  Surgeon: Cherre Robins, MD;  Location: [place];  Service: Vascular;;   AORTOGRAM Left 04/30/2021   Procedure: left lower extremity angiogram with second order cannulation;  Surgeon: Cherre Robins, MD;  Location: [place];  Service: Vascular;  Laterality: Left;   BIOPSY  11/12/2020   Procedure: BIOPSY;  Surgeon: Harvel Quale, MD;  [facility];  Service:  Gastroenterology;;   BREAST BIOPSY     CARDIAC CATHETERIZATION N/A 07/28/2016   Procedure: Left Heart Cath and Coronary Angiography;  Surgeon: Jettie Booze, MD;  Location: [place] [facility];  Service: Cardiovascular;  Laterality: N/A;   COLONOSCOPY N/A 05/10/2019   Procedure: COLONOSCOPY;  Surgeon: Danie Binder, MD;  [facility];  Service: Endoscopy;  Laterality: N/A;  Phenergan 12.5 mg IV in pre-op   DILATION AND CURETTAGE OF UTERUS     ESOPHAGOGASTRODUODENOSCOPY (EGD) WITH PROPOFOL N/A 11/12/2020   Procedure: ESOPHAGOGASTRODUODENOSCOPY (EGD) WITH PROPOFOL;  Surgeon: Harvel Quale, MD;  [facility];  Service: Gastroenterology;  Laterality: N/A;   I & D EXTREMITY Bilateral 09/22/2017   Procedure: BILATERAL DEBRIDEMENT LEG/FOOT ULCERS, APPLY VERAFLO WOUND VAC;  Surgeon: Newt Minion, MD;  Location: [place];  Service: Orthopedics;  Laterality: Bilateral;   I & D EXTREMITY Right 10/11/2018   Procedure: IRRIGATION AND DEBRIDEMENT RIGHT HAND;  Surgeon: [name], MD;  Location: [place];  Service: Orthopedics;  Laterality: Right;   I & D EXTREMITY Right 10/13/2018   Procedure: REPEAT IRRIGATION AND DEBRIDEMENT RIGHT HAND;  Surgeon: [name], MD;  Location: [place];  Service: Orthopedics;  Laterality: Right;   I & D EXTREMITY Right 11/22/2018   Procedure: IRRIGATION AND DEBRIDEMENT AND PINNING RIGHT HAND;  Surgeon: [name], MD;  Location: [place];  Service: Orthopedics;  Laterality: Right;   IR RADIOLOGIST EVAL & MGMT  07/05/2018   LOWER EXTREMITY ANGIOGRAM Bilateral 04/23/2021   Procedure: RIGHT  AND LEFT LOWER EXTREMITY ANGIOGRAM;  Surgeon: Cherre Robins, MD;  Location: [place];  Service: Vascular;  Laterality: Bilateral;   POLYPECTOMY  05/10/2019   Procedure: POLYPECTOMY;  Surgeon: Danie Binder, MD;  [facility];  Service: Endoscopy;;   SKIN SPLIT GRAFT Bilateral 09/28/2017   Procedure: BILATERAL SPLIT THICKNESS SKIN GRAFT  LEGS/FEET AND APPLY VAC;  Surgeon: Newt Minion, MD;  Location: [place];  Service: Orthopedics;  Laterality: Bilateral;   SKIN SPLIT GRAFT Right 11/22/2018   Procedure: SKIN GRAFT SPLIT THICKNESS;  Surgeon: [name], MD;  Location: [place];  Service: Orthopedics;  Laterality: Right;   Family History:  Family History  Problem Relation Age of Onset   Hypertension Mother    Heart attack Mother    Hypertension Father        CABG    Hypertension Sister    Hypertension Brother    Hypertension Sister    Cancer Sister        breast    Arthritis Other    Cancer Other    Diabetes Other    Asthma Other    Hypertension Daughter    Hypertension Son    Family Psychiatric  History:None known Social History:  Social History   Substance and Sexual Activity  Alcohol Use Not Currently  Alcohol/week: 0.0 standard drinks   Comment: Quit in 2017     Social History   Substance and Sexual Activity  Drug Use Not Currently   Frequency: 3.0 times per week   Types: Marijuana, Cocaine   Comment: none since August 2018    Social History   Socioeconomic History   Marital status: Widowed    Spouse name: Not on file   Number of children: 2   Years of education: 9   Highest education level: 9th grade  Occupational History   Occupation: Disabled  Tobacco Use   Smoking status: Some Days    Packs/day: 0.25    Years: 20.00    Pack years: 5.00    Types: Cigarettes    Last attempt to quit: 06/03/2017    Years since quitting: 3.9   Smokeless tobacco: Never  Vaping Use   Vaping Use: Never used  Substance and Sexual Activity   Alcohol use: Not Currently    Alcohol/week: 0.0 standard drinks    Comment: Quit in 2017   Drug use: Not Currently    Frequency: 3.0 times per week    Types: Marijuana, Cocaine    Comment: none since August 2018   Sexual activity: Not on file  Other Topics Concern   Not on file  Social History Narrative   Right handed   Drinks 1-2 cups caffeine daily    Social Determinants of Health   Financial Resource Strain: Medium Risk   Difficulty of Paying Living Expenses: Somewhat hard  Food Insecurity: Food Insecurity Present   Worried About Charity fundraiser in the Last Year: Sometimes true   Ran Out of Food in the Last Year: Sometimes true  Transportation Needs: No Transportation Needs   Lack of Transportation (Medical): No   Lack of Transportation (Non-Medical): No  Physical Activity: Insufficiently Active   Days of Exercise per Week: 7 days   Minutes of Exercise per Session: 20 min  Stress: Stress Concern Present   Feeling of Stress : To some extent  Social Connections: Moderately Isolated   Frequency of Communication with Friends and Family: More than three times a week   Frequency of Social Gatherings with Friends and Family: More than three times a week   Attends Religious Services: More than 4 times per year   Active Member of [employer] or Organizations: No   Attends Archivist Meetings: Never   Marital Status: Widowed   Additional Social History:    Allergies:  No Known Allergies  Labs:  Results for orders placed or performed during the hospital encounter of 04/21/21 (from the past 48 hour(s))  Glucose, capillary     Status: Abnormal   Collection Time: 05/21/21  8:22 PM  Result Value Ref Range   Glucose-Capillary 337 (H) 70 - 99 mg/dL    Comment: Glucose reference range applies only to samples taken after fasting for at least 8 hours.  Glucose, capillary     Status: Abnormal   Collection Time: 05/22/21  6:47 AM  Result Value Ref Range   Glucose-Capillary 164 (H) 70 - 99 mg/dL    Comment: Glucose reference range applies only to samples taken after fasting for at least 8 hours.  Glucose, capillary     Status: Abnormal   Collection Time: 05/22/21 11:10 AM  Result Value Ref Range   Glucose-Capillary 194 (H) 70 - 99 mg/dL    Comment: Glucose reference range applies only to samples taken after fasting for at least 8  hours.  Glucose, capillary     Status: Abnormal   Collection Time: 05/22/21  5:22 PM  Result Value Ref Range   Glucose-Capillary 231 (H) 70 - 99 mg/dL    Comment: Glucose reference range applies only to samples taken after fasting for at least 8 hours.  Glucose, capillary     Status: Abnormal   Collection Time: 05/22/21  8:14 PM  Result Value Ref Range   Glucose-Capillary 208 (H) 70 - 99 mg/dL    Comment: Glucose reference range applies only to samples taken after fasting for at least 8 hours.  Glucose, capillary     Status: Abnormal   Collection Time: 05/23/21  6:54 AM  Result Value Ref Range   Glucose-Capillary 137 (H) 70 - 99 mg/dL    Comment: Glucose reference range applies only to samples taken after fasting for at least 8 hours.   *Note: Due to a large number of results and/or encounters for the requested time period, some results have not been displayed. A complete set of results can be found in Results Review.    Current Facility-Administered Medications  Medication Dose Route Frequency Provider Last Rate Last Admin   acetaminophen (TYLENOL) tablet 650 mg  650 mg Oral Q6H PRN Johnson, Clanford L, MD   650 mg at 05/15/21 0535   Or   acetaminophen (TYLENOL) suppository 650 mg  650 mg Rectal Q6H PRN Johnson, Clanford L, MD       amLODipine (NORVASC) tablet 10 mg  10 mg Oral Daily Johnson, Clanford L, MD   10 mg at 05/22/21 0959   apixaban (ELIQUIS) tablet 5 mg  5 mg Oral BID Lavina Hamman, MD   5 mg at 05/22/21 2051   aspirin EC tablet 81 mg  81 mg Oral Daily Dagoberto Ligas, PA-C   81 mg at 05/22/21 [id number]   benztropine (COGENTIN) tablet 0.5 mg  0.5 mg Oral BID Damita Dunnings B, MD   0.5 mg at 05/22/21 2051   Chlorhexidine Gluconate Cloth 2 % PADS 6 each  6 each Topical Daily Allie Bossier, MD   6 each at 05/22/21 1000   feeding supplement (ENSURE ENLIVE / ENSURE PLUS) liquid 237 mL  237 mL Oral TID BM Dwyane Dee, MD   237 mL at 05/22/21 1000   FLUoxetine (PROZAC) capsule  20 mg  20 mg Oral Daily Damita Dunnings B, MD   20 mg at 05/22/21 0959   haloperidol (HALDOL) tablet 5 mg  5 mg Oral TID Damita Dunnings B, MD   5 mg at 05/22/21 2051   hydrOXYzine (ATARAX/VISTARIL) tablet 25 mg  25 mg Oral TID Damita Dunnings B, MD   25 mg at 05/22/21 2051   insulin aspart (novoLOG) injection 0-5 Units  0-5 Units Subcutaneous QHS Johnson, Clanford L, MD   2 Units at 05/22/21 2052   insulin aspart (novoLOG) injection 0-9 Units  0-9 Units Subcutaneous TID WC Johnson, Clanford L, MD   3 Units at 05/22/21 1743   insulin aspart (novoLOG) injection 3 Units  3 Units Subcutaneous TID WC Johnson, Clanford L, MD   3 Units at 05/22/21 1744   lactated ringers infusion   Intravenous Continuous Annye Asa, MD 10 mL/hr at 04/30/21 1255 Restarted at 04/30/21 1430   levETIRAcetam (KEPPRA) tablet 750 mg  750 mg Oral BID Wynetta Emery, Clanford L, MD   750 mg at 05/22/21 2052   lip balm (CARMEX) ointment   Topical PRN Cristal Ford, DO  LORazepam (ATIVAN) tablet 0.5 mg  0.5 mg Oral Q6H PRN Lavina Hamman, MD       metoprolol tartrate (LOPRESSOR) tablet 50 mg  50 mg Oral BID Wynetta Emery, Clanford L, MD   50 mg at 05/22/21 2051   morphine 2 MG/ML injection 2 mg  2 mg Intravenous Q4H PRN Lavina Hamman, MD       multivitamin with minerals tablet 1 tablet  1 tablet Oral Daily Dwyane Dee, MD   1 tablet at 05/22/21 [id number]   nicotine (NICODERM CQ - dosed in mg/24 hr) patch 7 mg  7 mg Transdermal Daily Dwyane Dee, MD   7 mg at 05/22/21 1005   ondansetron (ZOFRAN) tablet 4 mg  4 mg Oral Q6H PRN Johnson, Clanford L, MD       Or   ondansetron (ZOFRAN) injection 4 mg  4 mg Intravenous Q6H PRN Johnson, Clanford L, MD       oxybutynin (DITROPAN-XL) 24 hr tablet 5 mg  5 mg Oral QHS Johnson, Clanford L, MD   5 mg at 05/22/21 2051   oxyCODONE (Oxy IR/ROXICODONE) immediate release tablet 5 mg  5 mg Oral Q6H PRN Lavina Hamman, MD       pantoprazole (PROTONIX) EC tablet 40 mg  40 mg Oral Daily Johnson,  Clanford L, MD   40 mg at 05/22/21 0959   rosuvastatin (CRESTOR) tablet 10 mg  10 mg Oral Daily Johnson, Clanford L, MD   10 mg at 05/21/21 1004   sodium chloride flush (NS) 0.9 % injection 10-40 mL  10-40 mL Intracatheter PRN Allie Bossier, MD   20 mL at 05/20/21 0836   traZODone (DESYREL) tablet 50 mg  50 mg Oral QHS Johnson, Clanford L, MD   50 mg at 05/22/21 2051   umeclidinium bromide (INCRUSE ELLIPTA) 62.5 MCG/INH 1 puff  1 puff Inhalation Daily Danford, Suann Larry, MD   1 puff at 05/22/21 [id number]   zinc sulfate capsule 220 mg  220 mg Oral Daily Johnson, Clanford L, MD   220 mg at 05/22/21 [id number]    Musculoskeletal: Strength & Muscle Tone: decreased Gait & Station:  remains in bed\ Patient leans: N/A            Psychiatric Specialty Exam:  Presentation  General Appearance: Appropriate for Environment  Eye Contact:Good  Speech:Clear and Coherent  Speech Volume:Normal  Handedness: No data recorded  Mood and Affect  Mood:Euthymic  Affect:Congruent   Thought Process  Thought Processes:Coherent  Descriptions of Associations:Intact  Orientation:Partial  Thought Content:Logical  History of Schizophrenia/Schizoaffective disorder:No data recorded Duration of Psychotic Symptoms:No data recorded Hallucinations:Hallucinations: None  Ideas of Reference:None  Suicidal Thoughts:Suicidal Thoughts: No  Homicidal Thoughts:Homicidal Thoughts: No   Sensorium  Memory:Immediate Fair; Recent Fair; Remote Fair  Judgment:Fair  Insight:Shallow   Executive Functions  Concentration:Fair  Attention Span:Good  [place] of Knowledge:Poor  Language:Fair   Psychomotor Activity  Psychomotor Activity:Psychomotor Activity: Decreased   Assets  Assets:Desire for Improvement; Resilience; Social Support   Sleep  Sleep:Sleep: Good   Physical Exam: Physical Exam Constitutional:      Appearance: Normal appearance.  HENT:     Head: Normocephalic  and atraumatic.  Eyes:     Extraocular Movements: Extraocular movements intact.     Conjunctiva/sclera: Conjunctivae normal.  Cardiovascular:     Rate and Rhythm: Normal rate.  Pulmonary:     Effort: Pulmonary effort is normal.     Breath sounds: Normal breath sounds.  Abdominal:  General: Abdomen is flat.  Musculoskeletal:        General: No swelling.  Skin:    General: Skin is warm and dry.  Neurological:     Mental Status: She is alert. Mental status is at baseline.   Review of Systems  Constitutional:  Negative for chills and fever.  HENT:  Negative for hearing loss.   Eyes:  Negative for blurred vision.  Respiratory:  Negative for cough and wheezing.   Cardiovascular:  Negative for chest pain.  Gastrointestinal:  Negative for abdominal pain.  Neurological:  Negative for dizziness.  Psychiatric/Behavioral:  Negative for hallucinations and suicidal ideas.   Blood pressure (!) 134/92, pulse 88, temperature 98.3 F (36.8 C), temperature source Oral, resp. rate 16, height '5\' 1"'$  (1.549 m), weight 74.7 kg, last menstrual period 05/19/2016, SpO2 98 %. Body mass index is 31.12 kg/m.  Treatment Plan Summary: Medication management MDD,recurrent, severe, w/ psychosis Traits of Schizoid personality Grief- currently at acceptance Patient remains psych stable and is doing well and interacting with care takers. Patient continues to report acceptance of her health status and her decision to not get her legs amputated. Patient has made it clear that religion is very important in her life and at this time she would like to rely on that and take her medications but forgo surgery.  - Atarax '25mg'$  TID to aid with the anxiety  - Continue nicotine patch '7mg'$  - Recommend scheduling treatments after 10am  - Continue Prozac '20mg'$  - Continue Haldol '5mg'$  PO TID - Continue cogentin 0.'5mg'$  BID  Recommendations for family meeting.  Primary team is having a family meeting with patient's children,  expected 7/30. Recommend that it be made very clear to family that patient does struggle with Major depression disorder and this plays a role in her ability to care for herself. Patient does very well on the above regimen and appears more at her baseline and able to interact in her daily care. It has been documented by psychiatry provider that patient's son, [name], does not believe patient has mental health disorder. Patient DOES need to continue her above medication regimen.    Patient is determined to be psychiatrically stable at this time. Psychiatry will sign off. Please do not hesitate to call back if questions arise. Thank you for this consult.    Disposition: No evidence of imminent risk to self or others at present.   Recommend patient follow with Psych OP to continue medication regimen.   PGY-2 Freida Busman, MD 05/23/2021 9:09 AM

## 2021-05-23 NOTE — Plan of Care (Signed)
  Problem: Education: Goal: Knowledge of General Education information will improve Description: Including pain rating scale, medication(s)/side effects and non-pharmacologic comfort measures Outcome: Progressing   Problem: Clinical Measurements: Goal: Ability to maintain clinical measurements within normal limits will improve Outcome: Progressing Goal: Will remain free from infection Outcome: Progressing Goal: Diagnostic test results will improve Outcome: Progressing   

## 2021-05-24 ENCOUNTER — Other Ambulatory Visit: Payer: Self-pay

## 2021-05-24 DIAGNOSIS — Z9119 Patient's noncompliance with other medical treatment and regimen: Secondary | ICD-10-CM | POA: Diagnosis not present

## 2021-05-24 DIAGNOSIS — I739 Peripheral vascular disease, unspecified: Secondary | ICD-10-CM

## 2021-05-24 DIAGNOSIS — F333 Major depressive disorder, recurrent, severe with psychotic symptoms: Secondary | ICD-10-CM | POA: Diagnosis not present

## 2021-05-24 LAB — BASIC METABOLIC PANEL
Anion gap: 7 (ref 5–15)
BUN: 12 mg/dL (ref 6–20)
CO2: 27 mmol/L (ref 22–32)
Calcium: 8.3 mg/dL — ABNORMAL LOW (ref 8.9–10.3)
Chloride: 106 mmol/L (ref 98–111)
Creatinine, Ser: 1.05 mg/dL — ABNORMAL HIGH (ref 0.44–1.00)
GFR, Estimated: >60 mL/min (ref >60)
Glucose, Bld: 107 mg/dL — ABNORMAL HIGH (ref 70–99)
Potassium: 3.5 mmol/L (ref 3.5–5.1)
Sodium: 140 mmol/L (ref 135–145)

## 2021-05-24 LAB — CBC
HCT: 27.3 % — ABNORMAL LOW (ref 36.0–46.0)
Hemoglobin: 8.4 g/dL — ABNORMAL LOW (ref 12.0–15.0)
MCH: 28.7 pg (ref 26.0–34.0)
MCHC: 30.8 g/dL (ref 30.0–36.0)
MCV: 93.2 fL (ref 80.0–100.0)
Platelets: 279 10*3/uL (ref 150–400)
RBC: 2.93 MIL/uL — ABNORMAL LOW (ref 3.87–5.11)
RDW: 16.1 % — ABNORMAL HIGH (ref 11.5–15.5)
WBC: 10.9 10*3/uL — ABNORMAL HIGH (ref 4.0–10.5)
nRBC: 0 % (ref 0.0–0.2)

## 2021-05-24 LAB — GLUCOSE, CAPILLARY
Glucose-Capillary: 114 mg/dL — ABNORMAL HIGH (ref 70–99)
Glucose-Capillary: 270 mg/dL — ABNORMAL HIGH (ref 70–99)
Glucose-Capillary: 215 mg/dL — ABNORMAL HIGH (ref 70–99)

## 2021-05-24 LAB — C-REACTIVE PROTEIN: CRP: 5.4 mg/dL — ABNORMAL HIGH (ref <1.0)

## 2021-05-24 MED ORDER — INSULIN ASPART 100 UNIT/ML IJ SOLN
5.0000 [IU] | Freq: Three times a day (TID) | INTRAMUSCULAR | Status: DC
  Administered 2021-05-25 (×2): 5 [IU] via SUBCUTANEOUS

## 2021-05-24 NOTE — Progress Notes (Signed)
[facility] Progress Note  Patient: [name]    OFB:[id number]  DOA: 04/21/2021     Date of Service: the patient was seen and examined on 05/24/2021  Brief hospital course: Past medical history of chronic A. fib on Eliquis, seizure, tobacco abuse, COPD, PVD, HTN, type II DM, HFpEF, CKD 3B.  Presented with bilateral leg pain from her nursing facility.  Found to have worsening PVD and underwent stenting on 6/29. Also found to have MSSA bacteremia for which he received IV antibiotics. Recommendation is to perform bilateral BKA. Patient unable to make a decision. Psychiatry was consulted.  Initially felt that the patient does not have any capacity to make medical decision but later on psychiatry felt that the patient does have capacity to make medical decision.  Currently plan is continue conservative care.  Work on safe discharge plan.  Discussed with family and patient regarding goals of care.  Subjective: Reports bilateral ankle pain.  No nausea no vomiting.  Tells me that she wants to talk to her family but keeps on getting the wrong or incomplete numbers and unable to tell me which family member if she wants to talk.  No acute events overnight.  Refuses medication intermittently.  Assessment and Plan: 1.  Sepsis present on admission, secondary to lower leg cellulitis and gangrene secondary to staph capitis bacteremia Met SIRS criteria on admission with leukocytosis, tachypnea and tachycardia. ID was consulted. Treated with vancomycin.  Completed therapy on 7/14. Currently CRP significantly better.  Sepsis physiology resolved.  2.  Peripheral vascular disease with gangrene bilateral lower extremity Vascular surgery was consulted. Underwent aortogram on 6/29 with right superficial femoral/popliteal artery stenting. Left lower extremity angiogram on 7/6. Vascular surgery recommends bilateral BKA for this patient as there is no option for revascularization for her bilateral  gangrene. Patient vacillating between decision of going through the surgery and not going to the surgery. Currently vascular surgery has signed off. Continue current dressing changes although invariably this will lead to further infection and sepsis.  3.  Major depressive disorder, recurrent, severe with psychosis Traits of schizoid personality Grief Psychiatry was consulted for capacity evaluation. Initially patient was felt to be lacking capacity to understand medical condition and decision making. On 7/25 psychiatry on reevaluation felt that the patient does have capacity to make medical decision and continues to believe that the patient has capacity. Psychiatry currently signed off. Continuing Atarax 25 mg 3 times daily for anxiety Nicotine patch Prozac 20 mg daily.  Haldol 5 mg 3 times daily.  Cogentin 0.5 mg twice daily. Outpatient psychiatry follow-up recommended as well.  4.  Chronic A. fib, RVR HTN HFpEF On Eliquis and metoprolol.  We will continue. Blood pressure stable pretension continue Norvasc.  5.  CKD 3B Baseline serum creatinine 1.4.  On presentation serum creatinine remained the same although during therapy in the hospital serum creatinine has improved and normalized. Could have AKI although does not need criteria.  6.  History of seizure disorder Continue Keppra 750 mg twice daily. Patient intermittently noncompliant with his medication.  7.  GERD. Continuing PPI.  8.  Chronic respiratory failure with hypoxia. COPD Continuing chronic O2.  Does not appear to have any exacerbation.  9.  Poor dentition. Patient will follow-up with dentistry outpatient. Patient was placed on dysphagia 3 diet for ease of intake. Will continue regular diet for now as patient is unhappy with her current regimen in the hospital.  10.  Type 2 diabetes mellitus, uncontrolled with hyperglycemia.  With long-term insulin use and PVD and CKD. Hemoglobin A1c 11.1 on 5/29. Currently on  sliding scale insulin.  11.  HLD. Continuing statin.  12.  Anemia of chronic disease Associated with her gangrene. No active bleeding Monitor.  13.  Medication noncompliance. Mostly associated with the timing of the medication. Will monitor.  Scheduled Meds:  amLODipine  10 mg Oral Daily   apixaban  5 mg Oral BID   aspirin EC  81 mg Oral Daily   benztropine  0.5 mg Oral BID   Chlorhexidine Gluconate Cloth  6 each Topical Daily   feeding supplement  237 mL Oral TID BM   FLUoxetine  20 mg Oral Daily   haloperidol  5 mg Oral TID   hydrOXYzine  25 mg Oral TID   insulin aspart  0-5 Units Subcutaneous QHS   insulin aspart  0-9 Units Subcutaneous TID WC   insulin aspart  3 Units Subcutaneous TID WC   levETIRAcetam  750 mg Oral BID   metoprolol tartrate  50 mg Oral BID   multivitamin with minerals  1 tablet Oral Daily   nicotine  7 mg Transdermal Daily   oxybutynin  5 mg Oral QHS   pantoprazole  40 mg Oral Daily   rosuvastatin  10 mg Oral Daily   traZODone  50 mg Oral QHS   umeclidinium bromide  1 puff Inhalation Daily   zinc sulfate  220 mg Oral Daily   Continuous Infusions:  lactated ringers 10 mL/hr at 04/30/21 1255   PRN Meds: acetaminophen **OR** acetaminophen, lip balm, LORazepam, morphine injection, ondansetron **OR** ondansetron (ZOFRAN) IV, oxyCODONE, sodium chloride flush  Body mass index is 31.12 kg/m.  Nutrition Problem: Increased nutrient needs Etiology: wound healing Pressure Injury 12/03/20 Perineum Posterior Stage 1 -  Intact skin with non-blanchable redness of a localized area usually over a bony prominence. (Active)  12/03/20 2210  Location: Perineum  Location Orientation: Posterior  Staging: Stage 1 -  Intact skin with non-blanchable redness of a localized area usually over a bony prominence.  Wound Description (Comments):   Present on Admission: Yes     Pressure Injury 12/04/20 Coccyx Right;Medial Stage 2 -  Partial thickness loss of dermis  presenting as a shallow open injury with a red, pink wound bed without slough. 1cm x 0.5cm area to the right of previously healed pressure ulcer (Active)  12/04/20 0800  Location: Coccyx  Location Orientation: Right;Medial  Staging: Stage 2 -  Partial thickness loss of dermis presenting as a shallow open injury with a red, pink wound bed without slough.  Wound Description (Comments): 1cm x 0.5cm area to the right of previously healed pressure ulcer  Present on Admission: Yes     DVT Prophylaxis:   Place and maintain sequential compression device Start: 05/06/21 1002 apixaban (ELIQUIS) tablet 5 mg    Advance goals of care discussion: Pt is Full code.  Family Communication: no family was present at bedside, at the time of interview.  Family meeting for goals of care conversation on 7/30 at 6 PM.  Data Reviewed: I have personally reviewed and interpreted daily labs, tele strips, imaging. CBG elevated.  Electrolytes stable.  CRP mildly elevated.  Hemoglobin stable.  WBC stable.  Platelets stable.  Physical Exam:  General: Appear in mild distress, no Rash; Oral Mucosa Clear, moist. no Abnormal Neck Mass Or lumps, Conjunctiva normal  Cardiovascular: S1 and S2 Present, no Murmur, Respiratory: good respiratory effort, Bilateral Air entry present and CTA, no Crackles, no  wheezes Abdomen: Bowel Sound present, Soft and no tenderness Extremities: trace Pedal edema Neurology: alert and oriented to person affect emotionally labile. no new focal deficit Gait not checked due to patient safety concerns   Vitals:   05/23/21 0733 05/23/21 1237 05/24/21 0736 05/24/21 1411  BP: (!) 134/92 117/89 124/80 112/88  Pulse: 88 (!) 49 88 76  Resp: '16 20 15 15  ' Temp: 98.3 F (36.8 C) 98.6 F (37 C) 99 F (37.2 C) 99.8 F (37.7 C)  TempSrc: Oral Oral Oral Oral  SpO2: 98% (!) 88% 100% 100%  Weight:      Height:        Disposition:  Status is: Inpatient  Remains inpatient appropriate  because:Unsafe d/c plan  Dispo: The patient is from: Home              Anticipated d/c is to: SNF              Patient currently is not medically stable to d/c.   Difficult to place patient No  Time spent: 35 minutes. I reviewed all nursing notes, pharmacy notes, vitals, pertinent old records. I have discussed plan of care as described above with RN.  Author: Berle Mull, MD [facility] 05/24/2021 5:32 PM  To reach On-call, see care teams to locate the attending and reach out via [email or web address]. Between 7PM-7AM, please contact night-coverage If you still have difficulty reaching the attending provider, please page the [facility] (Director on Call) for [facility] on amion for assistance.

## 2021-05-24 NOTE — Plan of Care (Signed)
  Problem: Health Behavior/Discharge Planning: Goal: Ability to manage health-related needs will improve Outcome: Progressing   Problem: Clinical Measurements: Goal: Ability to maintain clinical measurements within normal limits will improve Outcome: Progressing Goal: Will remain free from infection Outcome: Progressing   

## 2021-05-24 NOTE — Plan of Care (Signed)
  Problem: Health Behavior/Discharge Planning: Goal: Ability to manage health-related needs will improve Outcome: Progressing   Problem: Clinical Measurements: Goal: Ability to maintain clinical measurements within normal limits will improve Outcome: Progressing   Problem: Activity: Goal: Risk for activity intolerance will decrease Outcome: Progressing   Problem: Nutrition: Goal: Adequate nutrition will be maintained Outcome: Progressing   Problem: Coping: Goal: Level of anxiety will decrease Outcome: Progressing   Problem: Elimination: Goal: Will not experience complications related to bowel motility Outcome: Progressing   Problem: Pain Managment: Goal: General experience of comfort will improve Outcome: Progressing   Problem: Safety: Goal: Ability to remain free from injury will improve Outcome: Progressing   Problem: Skin Integrity: Goal: Risk for impaired skin integrity will decrease Outcome: Progressing   Problem: Safety: Goal: Non-violent Restraint(s) Outcome: Progressing

## 2021-05-24 NOTE — Progress Notes (Signed)
[facility] PROGRESS NOTE  Patient: [name] [id number]   PCP: Caprice Renshaw, MD DOB: 1966-05-03   DOA: 04/21/2021   DOS: 05/24/2021    Family meeting with 1 brother, 2 sister, daughter and son and patient.  Explain that the patient was seen by Dr. Sharol Given in June 2022 and was recommended to undergo vascular evaluation and if no option for revascularization recommended to consider BKA. Skin graft was attempted in 2018 and has not worked. Explained that vascular surgery has performed angiogram and below the popliteal artery on both legs circulation is poor without any option for revascularization. Patient continues to have bilateral leg pain on an ongoing basis. Vascular surgery and Dr. Sharol Given both have suggested BKA as the only option to ensure that the patient does not have infection of the wound For pain control. On top of this her intermittently refusing medication, lack of nutrition remains a problem as well. Informed the family that given the patient does not want to go for the surgery the only option that we are left with is finding a place for her to continue to work with therapy and nutrition. Going home will not be a good option  Author: Berle Mull, MD [facility] 05/24/2021 7:37 PM   If 7PM-7AM, please contact night-coverage at [email or web address]

## 2021-05-24 NOTE — Progress Notes (Signed)
Patient refused all her night meds including eliquis this shift, education provided, family notified, will inform attending via [place] and try again later.

## 2021-05-25 DIAGNOSIS — F333 Major depressive disorder, recurrent, severe with psychotic symptoms: Secondary | ICD-10-CM | POA: Diagnosis not present

## 2021-05-25 DIAGNOSIS — Z9119 Patient's noncompliance with other medical treatment and regimen: Secondary | ICD-10-CM | POA: Diagnosis not present

## 2021-05-25 LAB — GLUCOSE, CAPILLARY
Glucose-Capillary: 149 mg/dL — ABNORMAL HIGH (ref 70–99)
Glucose-Capillary: 143 mg/dL — ABNORMAL HIGH (ref 70–99)
Glucose-Capillary: 61 mg/dL — ABNORMAL LOW (ref 70–99)
Glucose-Capillary: 97 mg/dL (ref 70–99)
Glucose-Capillary: 67 mg/dL — ABNORMAL LOW (ref 70–99)

## 2021-05-25 MED ORDER — DEXTROSE 50 % IV SOLN
INTRAVENOUS | Status: AC
  Administered 2021-05-25: 12.5 g via INTRAVENOUS
  Filled 2021-05-25: qty 50

## 2021-05-25 MED ORDER — RIVAROXABAN 20 MG PO TABS
20.0000 mg | ORAL_TABLET | Freq: Every day | ORAL | Status: DC
  Filled 2021-05-25: qty 1

## 2021-05-25 MED ORDER — OXYCODONE-ACETAMINOPHEN 5-325 MG PO TABS
1.0000 | ORAL_TABLET | Freq: Four times a day (QID) | ORAL | Status: DC | PRN
Start: 2021-05-25 — End: 2021-05-30
  Administered 2021-05-25 – 2021-05-28 (×8): 1 via ORAL
  Filled 2021-05-25 (×9): qty 1

## 2021-05-25 MED ORDER — UMECLIDINIUM BROMIDE 62.5 MCG/INH IN AEPB
1.0000 | INHALATION_SPRAY | Freq: Every day | RESPIRATORY_TRACT | Status: DC
  Filled 2021-05-25: qty 7

## 2021-05-25 MED ORDER — METOPROLOL SUCCINATE ER 50 MG PO TB24
75.0000 mg | ORAL_TABLET | Freq: Every day | ORAL | Status: DC
  Administered 2021-05-25 – 2021-05-27 (×3): 75 mg via ORAL
  Filled 2021-05-25 (×5): qty 1

## 2021-05-25 MED ORDER — DEXTROSE 50 % IV SOLN: 12.5000 g | INTRAVENOUS | Status: AC

## 2021-05-25 NOTE — Progress Notes (Signed)
Patient refused all her night medicines thish shift ,including her eliquis, education provided, will continue to monitor and update day shift nurse

## 2021-05-25 NOTE — Progress Notes (Signed)
ANTICOAGULATION CONSULT NOTE - Initial Consult  Pharmacy Consult for Xarelto Indication: atrial fibrillation  No Known Allergies  Patient Measurements: Height: '5\' 1"'$  (154.9 cm) Weight: 74.7 kg (164 lb 10.9 oz) IBW/kg (Calculated) : 47.8   Vital Signs: Temp: 98.2 F (36.8 C) (07/30 1949) BP: 136/104 (07/30 1949) Pulse Rate: 78 (07/30 1949)  Labs: Recent Labs    05/24/21 0402  HGB 8.4*  HCT 27.3*  PLT 279  CREATININE 1.05*    Estimated Creatinine Clearance: 54.7 mL/min (A) (by C-G formula based on SCr of 1.05 mg/dL (H)).   Medical History: Past Medical History:  Diagnosis Date   Alcohol use    Ankle fracture, lateral malleolus, closed 2013   Anxiety    Aortic atherosclerosis ([place]) 03/10/2021   Breast mass, left 2013   CHF (congestive heart failure) ([place])    a. EF 20-25% by echo in 05/2016 with cath showing normal cors b. EF 50-55% in 07/2020 c. 01/2021: EF at 55-60% with moderate LVH   Chronic anemia    CKD (chronic kidney disease), stage III (HCC)    Cocaine abuse (HCC)    COPD (chronic obstructive pulmonary disease) ([place])    Diabetes mellitus, type 2 (HCC)    Diabetic Charcot foot ([place])    Essential hypertension    History of cardiomyopathy    History of GI bleed 2011   Hyperlipidemia    Noncompliance    Obesity    PAF (paroxysmal atrial fibrillation) (HCC)    Panic attacks    PAT (paroxysmal atrial tachycardia) (HCC)    Previously on Amiodarone   Seizures ([place]) 10/01/2020   Sleep apnea    Not on CPAP   Stroke ([place]) 2018   Tobacco abuse    Urinary incontinence     Medications:  Scheduled:   amLODipine  10 mg Oral Daily   aspirin EC  81 mg Oral Daily   benztropine  0.5 mg Oral BID   Chlorhexidine Gluconate Cloth  6 each Topical Daily   feeding supplement  237 mL Oral TID BM   FLUoxetine  20 mg Oral Daily   haloperidol  5 mg Oral TID   hydrOXYzine  25 mg Oral TID   insulin aspart  0-5 Units Subcutaneous QHS   insulin aspart  0-9 Units  Subcutaneous TID WC   insulin aspart  5 Units Subcutaneous TID WC   levETIRAcetam  750 mg Oral BID   metoprolol succinate  75 mg Oral Daily   multivitamin with minerals  1 tablet Oral Daily   nicotine  7 mg Transdermal Daily   oxybutynin  5 mg Oral QHS   pantoprazole  40 mg Oral Daily   rosuvastatin  10 mg Oral Daily   traZODone  50 mg Oral QHS   umeclidinium bromide  1 puff Inhalation Q2200   zinc sulfate  220 mg Oral Daily    Assessment: 55yoF with history of nonvalvular atrial fibrillation. PTA was on eliquis 5 mg BID. Pt has been noncompliant with taking eliquis BID. Refusing multiple medications daily.Transitioning to Xarelto with a once daily administration. Last dose of eliquis on 7/30 '@0938'$ . Will plan to start xarelto today.  Scr: 1.05, H/H: 8.4/27.3, Plt WNL  Goal of Therapy:  Monitor platelets by anticoagulation protocol: Yes   Plan:  Start Xarelto 20 mg Daily today with evening meal Continue to monitor renal function and CBC weekly.   Lestine Box, PharmD PGY2 Infectious Diseases Pharmacy Resident   Please check [email or web address] for unit-specific pharmacy phone  numbers

## 2021-05-25 NOTE — Progress Notes (Signed)
[facility] Progress Note  Patient: [name]    KPT:[id number]  DOA: 04/21/2021     Date of Service: the patient was seen and examined on 05/25/2021  Brief hospital course: Past medical history of chronic A. fib on Eliquis, seizure, tobacco abuse, COPD, PVD, HTN, type II DM, HFpEF, CKD 3B.  Presented with bilateral leg pain from her nursing facility.  Found to have worsening PVD and underwent stenting on 6/29. Also found to have MSSA bacteremia for which he received IV antibiotics. Recommendation is to perform bilateral BKA. Patient unable to make a decision. Psychiatry was consulted.  Initially felt that the patient does not have any capacity to make medical decision but later on psychiatry felt that the patient does have capacity to make medical decision.  Currently plan is continue conservative care.  Arrange for safe discharge.  Subjective: No acute complaint.  No fever no chills.  Refusing medication.  Refusing oral intake.  Hypoglycemic late in the day.  Assessment and Plan: 1.  Sepsis present on admission, secondary to lower leg cellulitis and gangrene secondary to staph capitis bacteremia Met SIRS criteria on admission with leukocytosis, tachypnea and tachycardia. ID was consulted. Treated with vancomycin.  Completed therapy on 7/14. Currently CRP significantly better.  Sepsis physiology resolved.  2.  Peripheral vascular disease with gangrene bilateral lower extremity Vascular surgery was consulted. Underwent aortogram on 6/29 with right superficial femoral/popliteal artery stenting. Left lower extremity angiogram on 7/6. Vascular surgery recommends bilateral BKA for this patient as there is no option for revascularization for her bilateral gangrene. Patient vacillating between decision of going through the surgery and not going to the surgery. Currently vascular surgery has signed off. Continue current dressing changes although invariably this will lead to further  infection and sepsis.  3.  Major depressive disorder, recurrent, severe with psychosis Traits of schizoid personality Grief Psychiatry was consulted for capacity evaluation. Initially patient was felt to be lacking capacity to understand medical condition and decision making. On 7/25 psychiatry on reevaluation felt that the patient does have capacity to make medical decision and continues to believe that the patient has capacity. Psychiatry currently signed off. Continuing Atarax 25 mg 3 times daily for anxiety Nicotine patch Prozac 20 mg daily.  Haldol 5 mg 3 times daily.  Cogentin 0.5 mg twice daily. Outpatient psychiatry follow-up recommended as well.  4.  Chronic A. fib, RVR HTN HFpEF On Eliquis and metoprolol.   Unit the patient has poor compliance with twice daily regimen.  We will switch to Xarelto.  Also changing Lopressor to Toprol-XL. Blood pressure stable Continue Norvasc.  5.  CKD 3B Baseline serum creatinine 1.4.  On presentation serum creatinine remained the same although during therapy in the hospital serum creatinine has improved and normalized. Could have AKI although does not need criteria.  6.  History of seizure disorder Continue Keppra 750 mg twice daily. Patient intermittently noncompliant with his medication.  7.  GERD. Continuing PPI.  8.  Chronic respiratory failure with hypoxia. COPD Continuing chronic O2.  Does not appear to have any exacerbation.  9.  Poor dentition. Patient will follow-up with dentistry outpatient. Patient was placed on dysphagia 3 diet for ease of intake. Will continue regular diet for now as patient is unhappy with her current regimen in the hospital.  10.  Type 2 diabetes mellitus, uncontrolled with hyperglycemia.  With long-term insulin use and PVD and CKD. Hemoglobin A1c 11.1 on 5/29. Currently on sliding scale insulin.  11.  HLD. Continuing statin.  12.  Anemia of chronic disease Associated with her gangrene. No  active bleeding Monitor.  13.  Medication noncompliance. Mostly associated with the timing of the medication. Will monitor.  Scheduled Meds:  amLODipine  10 mg Oral Daily   aspirin EC  81 mg Oral Daily   benztropine  0.5 mg Oral BID   Chlorhexidine Gluconate Cloth  6 each Topical Daily   feeding supplement  237 mL Oral TID BM   FLUoxetine  20 mg Oral Daily   haloperidol  5 mg Oral TID   hydrOXYzine  25 mg Oral TID   insulin aspart  0-5 Units Subcutaneous QHS   insulin aspart  0-9 Units Subcutaneous TID WC   levETIRAcetam  750 mg Oral BID   metoprolol succinate  75 mg Oral Daily   multivitamin with minerals  1 tablet Oral Daily   nicotine  7 mg Transdermal Daily   oxybutynin  5 mg Oral QHS   pantoprazole  40 mg Oral Daily   rivaroxaban  20 mg Oral Daily   rosuvastatin  10 mg Oral Daily   traZODone  50 mg Oral QHS   umeclidinium bromide  1 puff Inhalation Q2200   zinc sulfate  220 mg Oral Daily   Continuous Infusions:  lactated ringers 10 mL/hr at 04/30/21 1255   PRN Meds: acetaminophen **OR** acetaminophen, lip balm, LORazepam, ondansetron **OR** ondansetron (ZOFRAN) IV, oxyCODONE-acetaminophen, sodium chloride flush  Body mass index is 31.12 kg/m.  Nutrition Problem: Increased nutrient needs Etiology: wound healing Pressure Injury 12/03/20 Perineum Posterior Stage 1 -  Intact skin with non-blanchable redness of a localized area usually over a bony prominence. (Active)  12/03/20 2210  Location: Perineum  Location Orientation: Posterior  Staging: Stage 1 -  Intact skin with non-blanchable redness of a localized area usually over a bony prominence.  Wound Description (Comments):   Present on Admission: Yes     Pressure Injury 12/04/20 Coccyx Right;Medial Stage 2 -  Partial thickness loss of dermis presenting as a shallow open injury with a red, pink wound bed without slough. 1cm x 0.5cm area to the right of previously healed pressure ulcer (Active)  12/04/20 0800   Location: Coccyx  Location Orientation: Right;Medial  Staging: Stage 2 -  Partial thickness loss of dermis presenting as a shallow open injury with a red, pink wound bed without slough.  Wound Description (Comments): 1cm x 0.5cm area to the right of previously healed pressure ulcer  Present on Admission: Yes     DVT Prophylaxis:   Place and maintain sequential compression device Start: 05/06/21 1002 rivaroxaban (XARELTO) tablet 20 mg    Advance goals of care discussion: Pt is Full code.  Family Communication: no family was present at bedside, at the time of interview.   Data Reviewed: I have personally reviewed and interpreted daily labs, tele strips, imaging. CBG e lower.  Physical Exam:  General: Appear in mild distress, no Rash; Oral Mucosa Clear, moist. no Abnormal Neck Mass Or lumps, Conjunctiva normal  Cardiovascular: S1 and S2 Present, no Murmur, Respiratory: good respiratory effort, Bilateral Air entry present and CTA, no Crackles, no wheezes Abdomen: Bowel Sound present, Soft and no tenderness Extremities: no Pedal edema bilateral leg wrapped. Neurology: alert and oriented to time, place, and person affect appropriate. no new focal deficit Gait not checked due to patient safety concerns   Vitals:   05/24/21 1411 05/24/21 1949 05/25/21 0737 05/25/21 1431  BP: 112/88 (!) 136/104 126/90 102/83  Pulse: 76 78 88 69  Resp: _0 Temp: 99.8 F (37.7 C) 98.2 F (36.8 C) 99.2 F (37.3 C) 97.6 F (36.4 C)  TempSrc: Oral  Oral Oral  SpO2: 100% 94% 94% 100%  Weight:      Height:        Disposition:  Status is: Inpatient  Remains inpatient appropriate because:Unsafe d/c plan  Dispo: The patient is from: Home              Anticipated d/c is to: SNF              Patient currently is not medically stable to d/c.   Difficult to place patient No  Time spent: 35 minutes. I reviewed all nursing notes, pharmacy notes, vitals, pertinent old records. I have  discussed plan of care as described above with RN.  Author: Berle Mull, MD [facility] 05/25/2021 8:21 PM  To reach On-call, see care teams to locate the attending and reach out via [email or web address]. Between 7PM-7AM, please contact night-coverage If you still have difficulty reaching the attending provider, please page the [facility] (Director on Call) for [facility] on amion for assistance.

## 2021-05-25 NOTE — Plan of Care (Addendum)
Patient refusing meds throughout the day. Very moody. Hypogylcemic this afternoon, given D50. Dr. Posey Pronto made aware. Patient refusing to eat lunch and dinner. Drsg changed and bath given.  Provider wants to plan to d/c PICC tomorrow. Plan for SNF?  Problem: Nutrition: Goal: Adequate nutrition will be maintained Outcome: Not Progressing   Problem: Elimination: Goal: Will not experience complications related to bowel motility Outcome: Progressing Goal: Will not experience complications related to urinary retention Outcome: Progressing   Problem: Pain Managment: Goal: General experience of comfort will improve Outcome: Not Progressing   Problem: Safety: Goal: Ability to remain free from injury will improve Outcome: Progressing   Problem: Skin Integrity: Goal: Risk for impaired skin integrity will decrease Outcome: Not Progressing

## 2021-05-26 DIAGNOSIS — Z9119 Patient's noncompliance with other medical treatment and regimen: Secondary | ICD-10-CM | POA: Diagnosis not present

## 2021-05-26 DIAGNOSIS — F333 Major depressive disorder, recurrent, severe with psychotic symptoms: Secondary | ICD-10-CM | POA: Diagnosis not present

## 2021-05-26 LAB — GLUCOSE, CAPILLARY
Glucose-Capillary: 85 mg/dL (ref 70–99)
Glucose-Capillary: 80 mg/dL (ref 70–99)
Glucose-Capillary: 133 mg/dL — ABNORMAL HIGH (ref 70–99)
Glucose-Capillary: 135 mg/dL — ABNORMAL HIGH (ref 70–99)

## 2021-05-26 MED ORDER — DRONABINOL 2.5 MG PO CAPS
2.5000 mg | ORAL_CAPSULE | Freq: Every day | ORAL | Status: DC
  Administered 2021-05-26: 2.5 mg via ORAL
  Filled 2021-05-26: qty 1

## 2021-05-26 MED ORDER — LEVETIRACETAM 750 MG PO TABS
750.0000 mg | ORAL_TABLET | Freq: Once | ORAL | Status: AC
  Administered 2021-05-26: 750 mg via ORAL
  Filled 2021-05-26: qty 1

## 2021-05-26 MED ORDER — RIVAROXABAN 20 MG PO TABS
20.0000 mg | ORAL_TABLET | Freq: Every day | ORAL | Status: DC
  Administered 2021-05-27 – 2021-05-29 (×3): 20 mg via ORAL
  Filled 2021-05-26 (×4): qty 1

## 2021-05-26 MED ORDER — UMECLIDINIUM BROMIDE 62.5 MCG/INH IN AEPB
1.0000 | INHALATION_SPRAY | Freq: Every day | RESPIRATORY_TRACT | Status: DC
  Administered 2021-05-27: 1 via RESPIRATORY_TRACT
  Filled 2021-05-26 (×2): qty 7

## 2021-05-26 MED ORDER — LEVETIRACETAM ER 500 MG PO TB24
1500.0000 mg | ORAL_TABLET | Freq: Every day | ORAL | Status: DC
  Administered 2021-05-27 – 2021-05-28 (×2): 1500 mg via ORAL
  Filled 2021-05-26 (×6): qty 3

## 2021-05-26 MED ORDER — OXYBUTYNIN CHLORIDE ER 5 MG PO TB24
5.0000 mg | ORAL_TABLET | Freq: Every day | ORAL | Status: DC
  Administered 2021-05-26 – 2021-05-27 (×2): 5 mg via ORAL
  Filled 2021-05-26 (×5): qty 1

## 2021-05-26 MED ORDER — RIVAROXABAN 20 MG PO TABS
20.0000 mg | ORAL_TABLET | Freq: Every day | ORAL | Status: DC
  Administered 2021-05-26: 20 mg via ORAL
  Filled 2021-05-26 (×2): qty 1

## 2021-05-26 NOTE — Progress Notes (Signed)
Patient refused all her night meds again tonight, attending dr notified via paged AMION , education provided although she cursed me out during education, will continue to monitor.

## 2021-05-26 NOTE — TOC Progression Note (Addendum)
Transition of Care [facility]) - Progression Note    Patient Details  Name: [name] MRN: [id number] Date of Birth: 1966-05-07  Transition of Care [facility]) CM/SW Contact  Milinda Antis, [place] Phone Number: 05/26/2021, 12:53 PM  Clinical Narrative:    12:52-  CSW called the patient's daughter to update her on the status of finding a LTAC.  There was no answer.  CSW left a VM requesting a returned call.    CSW called [facility] because they accepted the patient in the [facility].  CSW spoke with Shirlee Limerick in admissions who informed CSW that the facility would review the referral again and contact CSW with the facilities decision.   13:05-  CSW received a returned call from the patient's daughter.  CSW informed the daughter that the only agency that has accepted the patient in the [facility] is [facility].  The patient's daughter was apprehensive about the patient going to [facility] and inquired about other options.  CSW informed the patient's daughter of the barriers to securing a SNF and informed the patient's daughter that [facility] had to review again and have not given a definite approval.  The daughter asked that CSW contact [facility] in [place] and inquire if they can accept the patient.  13:14-  CSW received a returned call from [facility].  They can accept the patient once insurance has approved.  CSW requested that [facility] CMA begin insurance auth.    13:21-  CSW contacted [facility] assisted living facility and spoke with Sunday Spillers.  CSW was informed that the facility does not have any female beds available.    14:21-  CSW called the patient's daughter and informed her that [facility] is unable to accept and [facility] has extended a bed offer.  The daughter reported that more than likely the family will accept [facility], but asked that CSW call back in the morning to confirm the plan.    15:02-  CSW received a call informing CSW that the patient's son has contacted [facility] CMA  and asked that [facility] be looked into as placement option.  15:04-  CSW contacted Ebony Hail with [facility] to verify that neither [facility] or [facility] [place] are able to extend a bed offer.  Ebony Hail confirmed that neither facility can extend a bed offer.    15:42-  CSW called and informed the patient's daughter of the information above  as CSW has been directed by leadership to only speak with the patient's daughter.    Pending: insurance auth          Expected Discharge Plan and Services                                                 Social Determinants of Health (SDOH) Interventions    Readmission Risk Interventions Readmission Risk Prevention Plan 10/31/2020 08/27/2020  Transportation Screening Complete Complete  Medication Review [employer] photographer) Complete Complete  PCP or Specialist appointment within 3-5 days of discharge - Complete  HRI or [place] - Complete  SW Recovery Care/Counseling Consult Complete Complete  Palliative Care Screening Not Applicable Not Applicable  Skilled Nursing Facility Complete Complete  Some recent data might be hidden

## 2021-05-26 NOTE — Progress Notes (Signed)
Hypoglycemic Event  CBG: 67  at 2040  Treatment: D50 25 mL (12.5 gm)  Symptoms: None  Follow-up CBG: Time:0107 CBG Result:85  Possible Reasons for Event: Inadequate meal intake  Comments/MD notified ,will do    [name]

## 2021-05-26 NOTE — Progress Notes (Signed)
[facility] Progress Note  Patient: [name]    [id number]  DOA: 04/21/2021     Date of Service: the patient was seen and examined on 05/26/2021  Brief hospital course: Past medical history of chronic A. fib on Eliquis, seizure, tobacco abuse, COPD, PVD, HTN, type II DM, HFpEF, CKD 3B.  Presented with bilateral leg pain from her nursing facility.  Found to have worsening PVD and underwent stenting on 6/29. Also found to have MSSA bacteremia for which he received IV antibiotics. Recommendation is to perform bilateral BKA. Patient unable to make a decision. Psychiatry was consulted.  Initially felt that the patient does not have any capacity to make medical decision but later on psychiatry felt that the patient does have capacity to make medical decision.  Currently plan is continue conservative care.  Arrange for safe discharge.  Subjective: No acute complaint.  Patient tells me that they are not giving her her pain medication.  Will discuss with RN.  No nausea no vomiting.  Do not feel like eating.  Assessment and Plan: 1.  Sepsis present on admission, secondary to lower leg cellulitis and gangrene secondary to staph capitis bacteremia Met SIRS criteria on admission with leukocytosis, tachypnea and tachycardia. ID was consulted. Treated with vancomycin.  Completed therapy on 7/14. Currently CRP significantly better.  Sepsis physiology resolved.  2.  Peripheral vascular disease with gangrene bilateral lower extremity Vascular surgery was consulted. Underwent aortogram on 6/29 with right superficial femoral/popliteal artery stenting. Left lower extremity angiogram on 7/6. Vascular surgery recommends bilateral BKA for this patient as there is no option for revascularization for her bilateral gangrene. Patient vacillating between decision of going through the surgery and not going to the surgery. Currently vascular surgery has signed off. Continue current dressing changes  although invariably this will lead to further infection and sepsis.  3.  Major depressive disorder, recurrent, severe with psychosis Traits of schizoid personality Grief Psychiatry was consulted for capacity evaluation. Initially patient was felt to be lacking capacity to understand medical condition and decision making. On 7/25 psychiatry on reevaluation felt that the patient does have capacity to make medical decision and continues to believe that the patient has capacity. Psychiatry currently signed off. Continuing Atarax 25 mg 3 times daily for anxiety Nicotine patch Prozac 20 mg daily.  Haldol 5 mg 3 times daily.  Cogentin 0.5 mg twice daily. Outpatient psychiatry follow-up recommended as well.  4.  Chronic A. fib, RVR HTN HFpEF On Eliquis and metoprolol.   Unit the patient has poor compliance with twice daily regimen.  We will switch to Xarelto.  Also changing Lopressor to Toprol-XL. Blood pressure stable Continue Norvasc.  5.  CKD 3B Baseline serum creatinine 1.4.  On presentation serum creatinine remained the same although during therapy in the hospital serum creatinine has improved and normalized. Could have AKI although does not need criteria.  6.  History of seizure disorder Continue Keppra 750 mg twice daily. Patient intermittently noncompliant with his medication.  We will switch to daily regimen.  7.  GERD. Continuing PPI.  8.  Chronic respiratory failure with hypoxia. COPD Continuing chronic O2.  Does not appear to have any exacerbation.  9.  Poor dentition. Patient will follow-up with dentistry outpatient. Patient was placed on dysphagia 3 diet for ease of intake. Will continue regular diet for now as patient is unhappy with her current regimen in the hospital.  10.  Type 2 diabetes mellitus, uncontrolled with hyperglycemia.  With long-term insulin  use and PVD and CKD. Hemoglobin A1c 11.1 on 5/29. Currently on sliding scale insulin.  11.  HLD. Continuing  statin.  12.  Anemia of chronic disease Associated with her gangrene. No active bleeding Monitor.  13.  Medication noncompliance. Mostly associated with the timing of the medication. Will monitor.  14. Anorexia. Will initiate Marinol.  This was discussed with the patient.  Scheduled Meds:  amLODipine  10 mg Oral Daily   aspirin EC  81 mg Oral Daily   benztropine  0.5 mg Oral BID   Chlorhexidine Gluconate Cloth  6 each Topical Daily   dronabinol  2.5 mg Oral QAC lunch   feeding supplement  237 mL Oral TID BM   FLUoxetine  20 mg Oral Daily   haloperidol  5 mg Oral TID   insulin aspart  0-5 Units Subcutaneous QHS   insulin aspart  0-9 Units Subcutaneous TID WC   [START ON 05/27/2021] levETIRAcetam  1,500 mg Oral Daily   metoprolol succinate  75 mg Oral Daily   multivitamin with minerals  1 tablet Oral Daily   nicotine  7 mg Transdermal Daily   oxybutynin  5 mg Oral Daily   pantoprazole  40 mg Oral Daily   [START ON 05/27/2021] rivaroxaban  20 mg Oral QPC breakfast   rosuvastatin  10 mg Oral Daily   traZODone  50 mg Oral QHS   umeclidinium bromide  1 puff Inhalation Daily   zinc sulfate  220 mg Oral Daily   Continuous Infusions:  lactated ringers 10 mL/hr at 04/30/21 1255   PRN Meds: acetaminophen **OR** acetaminophen, lip balm, LORazepam, ondansetron **OR** ondansetron (ZOFRAN) IV, oxyCODONE-acetaminophen, sodium chloride flush  Body mass index is 31.12 kg/m.  Nutrition Problem: Increased nutrient needs Etiology: wound healing    DVT Prophylaxis:   Place and maintain sequential compression device Start: 05/06/21 1002 rivaroxaban (XARELTO) tablet 20 mg    Advance goals of care discussion: Pt is Full code.  Family Communication: no family was present at bedside, at the time of interview.  We will update tomorrow.  Data Reviewed: I have personally reviewed and interpreted daily labs, tele strips, imaging. CBG went down to 67 last night.  Improving later.  Physical  Exam:  General: Appear in mild distress, no Rash; Oral Mucosa Clear, moist. no Abnormal Neck Mass Or lumps, Conjunctiva normal  Cardiovascular: S1 and S2 Present, no Murmur, Respiratory: good respiratory effort, Bilateral Air entry present and CTA, no Crackles, no wheezes Abdomen: Bowel Sound present, Soft and no tenderness Extremities: no Pedal edema, bilateral legs wrapped. Neurology: alert and oriented to time, place, and person affect appropriate. no new focal deficit Gait not checked due to patient safety concerns   Vitals:   05/25/21 1431 05/25/21 2112 05/26/21 0700 05/26/21 1500  BP: 102/83 113/88 121/80 122/83  Pulse: 69 84 75 81  Resp: '20 16 18 18  ' Temp: 97.6 F (36.4 C) 98.1 F (36.7 C) 98.4 F (36.9 C) 98 F (36.7 C)  TempSrc: Oral Oral Oral Oral  SpO2: 100% 100% 100% 98%  Weight:      Height:        Disposition:  Status is: Inpatient  Remains inpatient appropriate because:Unsafe d/c plan  Dispo: The patient is from: Home              Anticipated d/c is to: SNF              Patient currently is not medically stable to d/c.   Difficult  to place patient No  Time spent: 35 minutes. I reviewed all nursing notes, pharmacy notes, vitals, pertinent old records. I have discussed plan of care as described above with RN.  Author: Berle Mull, MD [facility] 05/26/2021 8:04 PM  To reach On-call, see care teams to locate the attending and reach out via [email or web address]. Between 7PM-7AM, please contact night-coverage If you still have difficulty reaching the attending provider, please page the [facility] (Director on Call) for [facility] on amion for assistance.

## 2021-05-27 LAB — GLUCOSE, CAPILLARY
Glucose-Capillary: 298 mg/dL — ABNORMAL HIGH (ref 70–99)
Glucose-Capillary: 237 mg/dL — ABNORMAL HIGH (ref 70–99)
Glucose-Capillary: 213 mg/dL — ABNORMAL HIGH (ref 70–99)
Glucose-Capillary: 283 mg/dL — ABNORMAL HIGH (ref 70–99)

## 2021-05-27 MED ORDER — HALOPERIDOL 5 MG PO TABS: 5.0000 mg | ORAL_TABLET | Freq: Three times a day (TID) | ORAL | 0 refills | Status: DC

## 2021-05-27 MED ORDER — BENZTROPINE MESYLATE 0.5 MG PO TABS: 0.5000 mg | ORAL_TABLET | Freq: Two times a day (BID) | ORAL | 0 refills | Status: DC

## 2021-05-27 MED ORDER — LEVETIRACETAM ER 1500 MG PO TB24: 1500.0000 mg | ORAL_TABLET | Freq: Every day | ORAL | 0 refills | Status: DC

## 2021-05-27 MED ORDER — ENSURE ENLIVE PO LIQD: 237.0000 mL | Freq: Three times a day (TID) | ORAL | 0 refills | Status: DC

## 2021-05-27 MED ORDER — METOPROLOL SUCCINATE ER 25 MG PO TB24: 75.0000 mg | ORAL_TABLET | Freq: Every day | ORAL | 0 refills | Status: DC

## 2021-05-27 MED ORDER — MEGESTROL ACETATE 400 MG/10ML PO SUSP
400.0000 mg | Freq: Every day | ORAL | Status: DC
  Administered 2021-05-27: 400 mg via ORAL
  Filled 2021-05-27 (×4): qty 10

## 2021-05-27 MED ORDER — RIVAROXABAN 20 MG PO TABS: 20.0000 mg | ORAL_TABLET | Freq: Every day | ORAL | 0 refills | Status: DC

## 2021-05-27 MED ORDER — LORAZEPAM 0.5 MG PO TABS: 0.5000 mg | ORAL_TABLET | Freq: Three times a day (TID) | ORAL | 0 refills | Status: DC | PRN

## 2021-05-27 MED ORDER — ASPIRIN 81 MG PO TBEC: 81.0000 mg | DELAYED_RELEASE_TABLET | Freq: Every day | ORAL | 11 refills | Status: DC

## 2021-05-27 MED ORDER — OXYCODONE-ACETAMINOPHEN 5-325 MG PO TABS: 1.0000 | ORAL_TABLET | Freq: Four times a day (QID) | ORAL | 0 refills | Status: DC | PRN

## 2021-05-27 MED ORDER — FLUOXETINE HCL 20 MG PO CAPS: 20.0000 mg | ORAL_CAPSULE | Freq: Every day | ORAL | 0 refills | Status: DC

## 2021-05-27 MED ORDER — MEGESTROL ACETATE 400 MG/10ML PO SUSP: 400.0000 mg | Freq: Every day | ORAL | 0 refills | Status: DC

## 2021-05-27 MED ORDER — NICOTINE 7 MG/24HR TD PT24: 7.0000 mg | MEDICATED_PATCH | Freq: Every day | TRANSDERMAL | 0 refills | Status: DC

## 2021-05-27 NOTE — Progress Notes (Signed)
EDCSW received phone call from provider stating that Peer-to-Peer consult with Bernadene Bell was completed and that provider and [employer] rep were in agreement that long-term care placement would be a more appropriate level of care than SNF.  Vergie Living MSW LCSWA Transitions of Care  Clinical Social Worker  [facility] Emergency Departments  [phone]

## 2021-05-27 NOTE — Progress Notes (Signed)
Nutrition Follow-up  DOCUMENTATION CODES:   Obesity unspecified  INTERVENTION:   -Continue Ensure Enlive po TID, each supplement provides 350 kcal and 20 grams of protein  -Continue MVI with minerals daily -Continue Magic cup TID with -Liberalize diet to regular  NUTRITION DIAGNOSIS:   Increased nutrient needs related to wound healing as evidenced by estimated needs.  Ongoing  GOAL:   Patient will meet greater than or equal to 90% of their needs  Progressing   MONITOR:   PO intake, Supplement acceptance, Labs, Weight trends, Skin, I & O's  REASON FOR ASSESSMENT:   Low Braden    ASSESSMENT:   [name] is a 55 y.o. female with history of tobacco use disorder, severe PVD, paroxysmal atrial fibrillation currently on anticoagulant, hyperlipidemia, hypertension, uncontrolled T2DM, CHF, CKD, and more presents to the ED with a chief complaint of bilateral leg pain. Has no other complaints, no change in bowel habits. No change in urinary habits. Denies dyspnea and cough. Wears 2L oxygen at baseline.  Reviewed I/O's: -160 ml x 245 hours and -8.2 L since 05/13/21  UOP: 400 ml x 24 hours  Pt unavailable at time of visit.   Pt remains with variable intake. Noted meal completion 0-50%. Per MD notes, pt does not like food off dysphagia 3 diet and plans to liberalize diet to regular. Pt continues to consume Ensure supplements.   Pt is refusing BKAs and vascular surgery has signed off.   Per TOC notes, pt awaiting insurance authorization for SNF.   Medications reviewed and include keppra and zinc sulfate.   Labs reviewed: CBGS: [id number] (inpatient orders for glycemic control are 0-5 units insulin aspart TID with meals).    Diet Order:   Diet Order             Diet Carb Modified Fluid consistency: Thin; Room service appropriate? Yes  Diet effective now                   EDUCATION NEEDS:   Education needs have been addressed  Skin:  Skin Assessment: Skin  Integrity Issues: Skin Integrity Issues:: Other (Comment), Incisions Incisions: closed rt groin, rt and lt pretibial Other: MASD rt and lt buttocks  Last BM:  05/24/21  Height:   Ht Readings from Last 1 Encounters:  04/30/21 '5\' 1"'$  (1.549 m)    Weight:   Wt Readings from Last 1 Encounters:  05/01/21 74.7 kg    Ideal Body Weight:  47.7 kg  BMI:  Body mass index is 31.12 kg/m.  Estimated Nutritional Needs:   Kcal:  1700-1900  Protein:  95-110 grams  Fluid:  > 1.7 L    [name], RD, LDN, [place] Registered Dietitian II Certified Diabetes Care and Education Specialist Please refer to [facility] for RD and/or RD on-call/weekend/after hours pager

## 2021-05-27 NOTE — Progress Notes (Signed)
This nurse messaged Randy RN regarding order to pull the PICC line. Louie Casa RN requested that [facility] wait until pt. DC orders placed by MD. This nurse instructed nurse to place IV consult when ready for PICC to be dc'd/pulled. Fran Lowes, RN [facility]

## 2021-05-27 NOTE — Discharge Summary (Addendum)
[facility] Discharge Summary   Patient: [name] [id number]  PCP: Caprice Renshaw, MD  Date of admission: 04/21/2021   Date of discharge:  05/27/2021     Discharge Diagnoses:  Principal problem  Sepsis present on admission, secondary to lower leg cellulitis and gangrene secondary to staph capitis bacteremia  Active Problems:   DMII (diabetes mellitus, type 2) ([place])   Essential hypertension   Insomnia   Medical non-compliance   Poor mobility   HTN (hypertension), malignant   Diabetic neuropathy (HCC)   MDD (major depressive disorder), recurrent, severe, with psychosis ([place])   Urinary incontinence   COPD (chronic obstructive pulmonary disease) (HCC)   PAF (paroxysmal atrial fibrillation) (HCC)   Atrial fibrillation with RVR (HCC)   Pressure injury of skin   Seizure disorder (HCC)   Sacral ulcer, limited to breakdown of skin ([place])   Multiple duodenal ulcers   Chronic respiratory failure with hypoxia ([place])   Atherosclerosis of native artery of both legs with gangrene ([place])   GERD (gastroesophageal reflux disease)   Admitted From: ALF Disposition:  ALF/ILF   Recommendations for Outpatient Follow-up:  PCP: please follow up with PCP in 1week, Dr Sharol Given in 2 weeks, vascular surgery as recommended  [place] for goals of care  Work on advance directive paperwork    Follow-up Information     [facility] Follow up in 1 month(s).   Why: The office will call the patient with an appointment Contact information: [place]. Schedule an appointment as soon as possible for a visit in 2 day(s).   Why: You will need a provider to prescribe your haldol, prozac, and cogentin at follow-up. Contact information: [address] [place] [address]        Caprice Renshaw, MD. Schedule an appointment as soon as possible  for a visit in 1 week(s).   Specialty: Internal Medicine Contact information: [place] [address] [phone]                Discharge Instructions     Diet - low sodium heart healthy   Complete by: As directed    Discharge wound care:   Complete by: As directed    Bilateral leg ulcers: cleansing once daily with NS, gently patting dry and covering with folded xeroform gauze.  This would be covered with an ABD pad and secured with a few turns of Kerlix roll gauze/paper tape.  The heels should be placed into Prevalon Boots  to prevent pressure injury.   Increase activity slowly   Complete by: As directed        Diet recommendation: Carb modified diet  Activity: The patient is advised to gradually reintroduce usual activities, as tolerated  Discharge Condition: stable  Code Status: Full code   History of present illness: As per the H and P dictated on admission, "[name] is a 55 y.o. female with history of tobacco use disorder, severe PVD, paroxysmal atrial fibrillation currently on anticoagulant, hyperlipidemia, hypertension, uncontrolled T2DM, CHF, CKD, and more presents to the ED with a chief complaint of bilateral leg pain. Has no other complaints, no change in bowel habits. No change in urinary habits. Denies dyspnea and cough. Wears 2L oxygen at baseline.   Son reported that the nursing facility at [facility] explained that patient has had decreased PO and had not  been ambulating, though this was her baseline. Son wanted patient here to be assessed and following up with orthopedic and vascular recommendations to have her legs amputated.   Chart review showed that patient was recently admitted to the hospital on 6/07 and discharged 6/10 due to sepsis 2/2 secondary to infected pressure ulcers on the bilateral lower extremities. She refused IV antibiotics after day 3 and was discharged home on doxycycline. To this, she states that she wanted to hasten  discharge process due to "wanting to smoke a cigarette." Subsequently, she followed up with orthopedic surgery (Dr. Sharol Given) and vascular surgery (Dr. Oneida Alar) who both recommended bilateral amputation as she is not candidate for revascularization.   In the ED Temp 98.1, HR on monitor 150, though pulse feels like 90, RR 22, BP 116/92, Sating 83% upon admission, though 100% on 3L [place] Blood culture ordered, pending to evaluate for bacteremia Imaging - Portable chest xray shows cardiomegaly noted as last admission. Otherwise, unremarkable. EKG showed an irregularly irregular rate and rhythm, tachycardia 120-160 on monitor. On telemetry, patient appeared to be in Afib WBC 12.8 RBC count 3.86, Hgb 10.8 up from last discharge (9.2 on 6/9) Chemistry panel reveals an elevated Cr that is at baseline, glucose, K 2.6, and Mg 1.5, Cr 1.33, venous lactic acid 1.2 Patient received potassium IV and oral, 2g IV Mag Cefepime 2g initially given  Ultrasound guided PIVs were placed, and PICC line was ordered for administration of antibiotics, electrolyte repletion, and fluids"  Hospital Course:  Past medical history of chronic A. fib on Eliquis, seizure, tobacco abuse, COPD, PVD, HTN, type II DM, HFpEF, CKD 3B.  Presented with bilateral leg pain from her nursing facility.  Found to have worsening PVD and underwent stenting on 6/29. Also found to have MSSA bacteremia for which he received IV antibiotics. Recommendation is to perform bilateral BKA. Patient unable to make a decision. Psychiatry was consulted.  Initially felt that the patient does not have any capacity to make medical decision but later on psychiatry felt that the patient does have capacity to make medical decision.  Summary of her active problems in the hospital is as following.  1.  Sepsis present on admission, secondary to lower leg cellulitis and gangrene secondary to staph capitis bacteremia Met SIRS criteria on admission with leukocytosis,  tachypnea and tachycardia. ID was consulted. Treated with vancomycin.  Completed therapy on 7/14. Currently CRP significantly better.  Sepsis physiology resolved.   2.  Peripheral vascular disease with gangrene bilateral lower extremity Vascular surgery was consulted. Underwent aortogram on 6/29 with right superficial femoral/popliteal artery stenting. Left lower extremity angiogram on 7/6. Vascular surgery recommends bilateral BKA for this patient as there is no option for revascularization for her bilateral gangrene. Patient vacillating between decision of going through the surgery and not going to the surgery. Currently vascular surgery has signed off. Continue current dressing changes although invariably this will lead to further infection and sepsis.   3.  Major depressive disorder, recurrent, severe with psychosis Traits of schizoid personality Grief Psychiatry was consulted for capacity evaluation. Initially patient was felt to be lacking capacity to understand medical condition and decision making. On 7/25 psychiatry on reevaluation felt that the patient does have capacity to make medical decision and continues to believe that the patient has capacity. Psychiatry currently signed off. Continuing Atarax 25 mg 3 times daily for anxiety Nicotine patch Prozac 20 mg daily.  Haldol 5 mg 3 times daily.  Cogentin 0.5 mg twice daily. Outpatient  psychiatry follow-up recommended as well.   4.  Chronic A. fib, RVR HTN HFpEF On Eliquis and metoprolol.   the patient has poor compliance with twice daily regimen. We will switch to Xarelto.   Also changing Lopressor to Toprol-XL. Blood pressure stable Continue Norvasc.   5.  CKD 3B Baseline serum creatinine 1.4.  On presentation serum creatinine remained the same although during therapy in the hospital serum creatinine has improved and normalized. Could have AKI although does not meet criteria for diagnosis.    6.  History of seizure  disorder Continue Keppra 750 mg twice daily. Patient intermittently noncompliant with his medication.  We will switch to daily regimen.   7.  GERD. Continuing PPI.   8.  Chronic respiratory failure with hypoxia. COPD Continuing chronic O2.  Does not appear to have any exacerbation.   9.  Poor dentition. Patient will follow-up with dentistry outpatient. Patient was placed on dysphagia 3 diet for ease of intake. Will continue regular diet for now as patient is unhappy with her current regimen in the hospital.   10.  Type 2 diabetes mellitus, uncontrolled with hyperglycemia.  With long-term insulin use and PVD and CKD. Hemoglobin A1c 11.1 on 5/29. Currently on sliding scale insulin.   11.  HLD. Continuing statin.   12.  Anemia of chronic disease Associated with her gangrene. No active bleeding Monitor.   13.  Medication noncompliance. Mostly associated with the timing of the medication. Will monitor.   14. Anorexia. Unable to tolerate Marinol. Switch to megace.  This was discussed with the patient.  28. Goals of care  Family meeting with 1 brother, 2 sister, daughter and son and patient.   Explain that the patient was seen by Dr. Sharol Given in June 2022 and was recommended to undergo vascular evaluation and if no option for revascularization recommended to consider BKA. Skin graft was attempted in 2018 and has not worked. Explained that vascular surgery has performed angiogram and below the popliteal artery on both legs circulation is poor without any option for revascularization. Patient continues to have bilateral leg pain on an ongoing basis. Vascular surgery and Dr. Sharol Given both have suggested BKA as the only option to ensure that the patient does not have infection of the wound and for pain control. On top of this her intermittently refusing medication, lack of nutrition remains a problem as well. Informed the family that given the patient does not want to go for the surgery the  only option that we are left with is finding a place for her to continue to work with therapy and nutrition. Going home will not be a good option.   Recommended family to work on advance directives and HCPOA process.   Body mass index is 31.12 kg/m.  Nutrition Problem: Increased nutrient needs Etiology: wound healing Nutrition Interventions: Interventions: Ensure Enlive (each supplement provides 350kcal and 20 grams of protein), MVI, Liberalize Diet, Magic cup  Pressure Injury 12/03/20 Perineum Posterior Stage 1 -  Intact skin with non-blanchable redness of a localized area usually over a bony prominence. (Active)  12/03/20 2210  Location: Perineum  Location Orientation: Posterior  Staging: Stage 1 -  Intact skin with non-blanchable redness of a localized area usually over a bony prominence.  Wound Description (Comments):   Present on Admission: Yes     Pressure Injury 12/04/20 Coccyx Right;Medial Stage 2 -  Partial thickness loss of dermis presenting as a shallow open injury with a red, pink wound bed without slough.  1cm x 0.5cm area to the right of previously healed pressure ulcer (Active)  12/04/20 0800  Location: Coccyx  Location Orientation: Right;Medial  Staging: Stage 2 -  Partial thickness loss of dermis presenting as a shallow open injury with a red, pink wound bed without slough.  Wound Description (Comments): 1cm x 0.5cm area to the right of previously healed pressure ulcer  Present on Admission: Yes     Pain control  - [place] was reviewed. - 5 day supply was provided. - Patient was instructed, not to drive, operate heavy machinery, perform activities at heights, swimming or participation in water activities or provide baby sitting services while on Pain, Sleep and Anxiety Medications; until her outpatient Physician has advised to do so again.  - Also recommended to not to take more than prescribed Pain, Sleep and  Anxiety Medications.  On the day of the discharge the patient's vitals were stable, and no other new acute medical condition were reported. The patient was felt safe to be discharge at long term care with No Therapy, pt/family refused..  Consultants: psychiatry, vascular surgery, orthopedics  Procedures:  PROCEDURE:   1) US guided right common femoral access 2) left lower extremity angiogram with second order cannulation    PROCEDURE:   1) US guided left common femoral artery access 2) Aortogram 3) left lower extremity angiogram 4) right lower extremity angiogram with third order cannulation (70m total contrast) 5) right superficial femoral / popliteal artery stenting (6x1238m+ 6x6038mluvia)  DISCHARGE MEDICATION: Allergies as of 05/27/2021   No Known Allergies      Medication List     STOP taking these medications    DULoxetine 60 MG capsule Commonly known as: CYMBALTA   Eliquis 5 MG Tabs tablet Generic drug: apixaban   furosemide 40 MG tablet Commonly known as: Lasix   levETIRAcetam 750 MG tablet Commonly known as: KEPPRA Replaced by: levETIRAcetam ER 1500 MG Tb24   metoprolol tartrate 50 MG tablet Commonly known as: LOPRESSOR   oxyCODONE 5 MG immediate release tablet Commonly known as: Oxy IR/ROXICODONE   silver sulfADIAZINE 1 % cream Commonly known as: SILVADENE       TAKE these medications    acetaminophen 325 MG tablet Commonly known as: TYLENOL Take 2 tablets (650 mg total) by mouth every 6 (six) hours as needed for headache, fever or mild pain.   Alogliptin Benzoate 25 MG Tabs Take 1 tablet by mouth daily.   amLODipine 10 MG tablet Commonly known as: NORVASC Take 1 tablet (10 mg total) by mouth daily.   aspirin 81 MG EC tablet Take 1 tablet (81 mg total) by mouth daily. Swallow whole. Start taking on: May 28, 2021   benztropine 0.5 MG tablet Commonly known as: COGENTIN Take 1 tablet (0.5 mg total) by mouth 2 (two) times daily.    feeding supplement Liqd Take 237 mLs by mouth 3 (three) times daily between meals.   FLUoxetine 20 MG capsule Commonly known as: PROZAC Take 1 capsule (20 mg total) by mouth daily. Start taking on: May 28, 2021   haloperidol 5 MG tablet Commonly known as: HALDOL Take 1 tablet (5 mg total) by mouth 3 (three) times daily.   HumaLOG KwikPen 100 UNIT/ML KwikPen Generic drug: insulin lispro Inject 1-10 Units into the skin See admin instructions. 151-200 =1u, 201-250= 2u, 251-300= 4u, 301-350= 6u, 351-400= 8u, 401-450= 10u   levETIRAcetam ER 1500 MG Tb24 Take 1,500 mg by mouth daily. Start  taking on: May 28, 2021 Replaces: levETIRAcetam 750 MG tablet   LORazepam 0.5 MG tablet Commonly known as: ATIVAN Take 1 tablet (0.5 mg total) by mouth 3 (three) times daily as needed for anxiety.   megestrol 400 MG/10ML suspension Commonly known as: MEGACE Take 10 mLs (400 mg total) by mouth daily. Start taking on: May 28, 2021   metoprolol succinate 25 MG 24 hr tablet Commonly known as: TOPROL-XL Take 3 tablets (75 mg total) by mouth daily. Start taking on: May 28, 2021   nicotine 7 mg/24hr patch Commonly known as: NICODERM CQ - dosed in mg/24 hr Place 1 patch (7 mg total) onto the skin daily. Start taking on: May 28, 2021   omeprazole 20 MG capsule Commonly known as: PRILOSEC Take 20 mg by mouth daily.   oxybutynin 5 MG 24 hr tablet Commonly known as: DITROPAN-XL Take 5 mg by mouth at bedtime.   oxyCODONE-acetaminophen 5-325 MG tablet Commonly known as: PERCOCET/ROXICET Take 1 tablet by mouth every 6 (six) hours as needed for moderate pain or severe pain.   rivaroxaban 20 MG Tabs tablet Commonly known as: XARELTO Take 1 tablet (20 mg total) by mouth daily after breakfast. Start taking on: May 28, 2021   rosuvastatin 10 MG tablet Commonly known as: CRESTOR Take 10 mg by mouth daily.   Spiriva HandiHaler 18 MCG inhalation capsule Generic drug: tiotropium Place  18 mcg into inhaler and inhale daily as needed (Shortness of breath).   traZODone 50 MG tablet Commonly known as: DESYREL Take 1 tablet (50 mg total) by mouth at bedtime.   Trulicity 3 WN/0.2VO Sopn Generic drug: Dulaglutide Inject 3 mg into the skin See admin instructions. Inject 0.5 mls subcutaneously one time a day every Friday   Zinc Oxide 10 % Aero Apply 1 application topically daily.   zinc sulfate 220 (50 Zn) MG capsule Take 1 capsule (220 mg total) by mouth daily.               Discharge Care Instructions  (From admission, onward)           Start     Ordered   05/27/21 0000  Discharge wound care:       Comments: Bilateral leg ulcers: cleansing once daily with NS, gently patting dry and covering with folded xeroform gauze.  This would be covered with an ABD pad and secured with a few turns of Kerlix roll gauze/paper tape.  The heels should be placed into Prevalon Boots  to prevent pressure injury.   05/27/21 1430            Discharge Exam: Filed Weights   04/30/21 0615 04/30/21 1236 05/01/21 0606  Weight: 75 kg 75 kg 74.7 kg   Vitals:   05/27/21 0840 05/27/21 1328  BP:  105/75  Pulse:  (!) 52  Resp:  16  Temp:  98.2 F (36.8 C)  SpO2: 96% 100%   General: Appear in no distress, no Rash; Oral Mucosa Clear, moist. no Abnormal Neck Mass Or lumps, Conjunctiva normal  Cardiovascular: S1 and S2 Present, no Murmur Respiratory: good respiratory effort, Bilateral Air entry present and CTA, no Crackles, no wheezes Abdomen: Bowel Sound present, Soft and no tenderness Extremities: no Pedal edema, leg ulceration wrapped Neurology: alert and oriented to time, place, and person affect flat in affect. no new focal deficit  The results of significant diagnostics from this hospitalization (including imaging, microbiology, ancillary and laboratory) are listed below for reference.    Significant  Diagnostic Studies: [name] EKG SITE RITE  Result Date: 05/04/2021 If  Site Rite image not attached, placement could not be confirmed due to current cardiac rhythm.  HYBRID OR IMAGING ([facility] ONLY)  Result Date: 04/30/2021 There is no interpretation for this exam.  This order is for images obtained during a surgical procedure.  Please See "Surgeries" Tab for more information regarding the procedure.    Microbiology: No results found for this or any previous visit (from the past 240 hour(s)).   Labs: CBC: Recent Labs  Lab 05/24/21 0402  WBC 10.9*  HGB 8.4*  HCT 27.3*  MCV 93.2  PLT 871   Basic Metabolic Panel: Recent Labs  Lab 05/24/21 0402  NA 140  K 3.5  CL 106  CO2 27  GLUCOSE 107*  BUN 12  CREATININE 1.05*  CALCIUM 8.3*   Liver Function Tests: No results for input(s): AST, ALT, ALKPHOS, BILITOT, PROT, ALBUMIN in the last 168 hours. CBG: Recent Labs  Lab 05/26/21 0447 05/26/21 1149 05/26/21 1506 05/27/21 0639 05/27/21 1135  GLUCAP 80 133* 135* 298* 237*    Time spent: 35 minutes  Signed:  Berle Mull  [facility]  05/27/2021

## 2021-05-27 NOTE — Plan of Care (Signed)
  Problem: Pain Managment: Goal: General experience of comfort will improve Outcome: Progressing   Problem: Safety: Goal: Ability to remain free from injury will improve Outcome: Progressing   Problem: Skin Integrity: Goal: Risk for impaired skin integrity will decrease Outcome: Progressing   

## 2021-05-27 NOTE — TOC Progression Note (Addendum)
Transition of Care [facility]) - Progression Note    Patient Details  Name: [name] MRN: [id number] Date of Birth: [birth year]  Transition of Care [facility]) CM/SW Contact  Milinda Antis, [place] Phone Number: 05/27/2021, 8:42 AM  Clinical Narrative:     08:27-CSW called the patient's daughter to get the final decision about the placement of the patient.  There was no answer.  CSW left a VM requesting a returned call.    08:54-CSW received a returned from the daughter.  The daughter reports that the choice would be for the patient to go to [place].    10:45-  CSW contacted [place] to inquire about when they would be able to accept the patient.  There was no answer.  CSW is awaiting a returned call.          Expected Discharge Plan and Services                                                 Social Determinants of Health (SDOH) Interventions    Readmission Risk Interventions Readmission Risk Prevention Plan 10/31/2020 08/27/2020  Transportation Screening Complete Complete  Medication Review Press photographer) Complete Complete  PCP or Specialist appointment within 3-5 days of discharge - Complete  HRI or [place] - Complete  SW Recovery Care/Counseling Consult Complete Complete  Palliative Care Screening Not Applicable Not Applicable  Skilled Nursing Facility Complete Complete  Some recent data might be hidden

## 2021-05-27 NOTE — Plan of Care (Signed)

## 2021-05-28 DIAGNOSIS — R7881 Bacteremia: Secondary | ICD-10-CM

## 2021-05-28 DIAGNOSIS — Z7189 Other specified counseling: Secondary | ICD-10-CM

## 2021-05-28 DIAGNOSIS — M79604 Pain in right leg: Secondary | ICD-10-CM

## 2021-05-28 DIAGNOSIS — M79605 Pain in left leg: Secondary | ICD-10-CM

## 2021-05-28 DIAGNOSIS — B958 Unspecified staphylococcus as the cause of diseases classified elsewhere: Secondary | ICD-10-CM

## 2021-05-28 DIAGNOSIS — Z515 Encounter for palliative care: Secondary | ICD-10-CM

## 2021-05-28 LAB — GLUCOSE, CAPILLARY
Glucose-Capillary: 209 mg/dL — ABNORMAL HIGH (ref 70–99)
Glucose-Capillary: 345 mg/dL — ABNORMAL HIGH (ref 70–99)
Glucose-Capillary: 251 mg/dL — ABNORMAL HIGH (ref 70–99)
Glucose-Capillary: 169 mg/dL — ABNORMAL HIGH (ref 70–99)

## 2021-05-28 MED ORDER — SENNOSIDES-DOCUSATE SODIUM 8.6-50 MG PO TABS
1.0000 | ORAL_TABLET | Freq: Two times a day (BID) | ORAL | Status: DC
  Administered 2021-05-28 – 2021-05-29 (×2): 1 via ORAL
  Filled 2021-05-28 (×4): qty 1

## 2021-05-28 MED ORDER — INSULIN GLARGINE-YFGN 100 UNIT/ML ~~LOC~~ SOLN
10.0000 [IU] | Freq: Every day | SUBCUTANEOUS | Status: DC
  Administered 2021-05-29: 10 [IU] via SUBCUTANEOUS
  Filled 2021-05-28 (×3): qty 0.1

## 2021-05-28 NOTE — Progress Notes (Signed)
Inpatient Diabetes Program Recommendations  AACE/ADA: New Consensus Statement on Inpatient Glycemic Control (2015)  Target Ranges:  Prepandial:   less than 140 mg/dL      Peak postprandial:   less than 180 mg/dL (1-2 hours)      Critically ill patients:  140 - 180 mg/dL   Lab Results  Component Value Date   GLUCAP 345 (H) 05/28/2021   HGBA1C 11.1 (H) 03/23/2021    Review of Glycemic Control Results for [name], [name] (MRN [id number]) as of 05/28/2021 14:10  Ref. Range 05/28/2021 06:34 05/28/2021 11:08  Glucose-Capillary Latest Ref Range: 70 - 99 mg/dL 209 (H) 345 (H)   Diabetes history: DM 2 Outpatient Diabetes medications: Trulicity 3 mg daily, Humalog 0-10 units tid with meals Current orders for Inpatient glycemic control:  Novolog sensitive tid with meals and HS  Inpatient Diabetes Program Recommendations:    Consider adding glargine-yfgn (semglee) 8 units daily.    Thanks,  Adah Perl, RN, BC-ADM Inpatient Diabetes Coordinator Pager [phone]  (8a-5p)

## 2021-05-28 NOTE — Progress Notes (Signed)
Spoke with Louie Casa RN who states that he will talk to the doctor about the removal of the PICC line, due to the patient having low blood sugar in the mornings and by removing the PICC line she will not have access and she is currently not being discharged. At this time the PICC line removal order will be completed and RN states he will communicate with IV team and the Doctor when the order is replaced.

## 2021-05-28 NOTE — TOC Progression Note (Signed)
Transition of Care [facility]) - Progression Note    Patient Details  Name: [name] MRN: [id number] Date of Birth: Dec 31, 1965  Transition of Care [facility]) CM/SW Contact  Emeterio Reeve, [place] Phone Number: 05/28/2021, 12:38 PM  Clinical Narrative:     [name] denied pt for SNF. Pt has medicaid, [place] is running her medicaid to see if they can use it for SNF. CSW waiting on call back.       Expected Discharge Plan and Services                                                 Social Determinants of Health (SDOH) Interventions    Readmission Risk Interventions Readmission Risk Prevention Plan 10/31/2020 08/27/2020  Transportation Screening Complete Complete  Medication Review Press photographer) Complete Complete  PCP or Specialist appointment within 3-5 days of discharge - Complete  HRI or [place] - Complete  SW Recovery Care/Counseling Consult Complete Complete  Palliative Care Screening Not Applicable Not [place] Complete Complete  Some recent data might be hidden   Emeterio Reeve, Latanya Presser, [place] Social Worker [phone]

## 2021-05-28 NOTE — Consult Note (Signed)
Palliative Care Consult Note                                  Date: 05/28/2021   Patient Name: [name]  DOB: 11-Apr-1966  MRN: [id number]  Age / Sex: 55 y.o., female  PCP: Caprice Renshaw, MD Referring Physician: Domenic Polite, MD  Reason for Consultation: Establishing goals of care  HPI/Patient Profile: 55 y.o. female  with past medical history of tobacco use disorder, severe PVD, paroxysmal atrial fibrillation currently on anticoagulant, hyperlipidemia, hypertension, uncontrolled T2DM, CHF, CKD admitted on 04/21/2021 with bilateral leg pain. Noted by previous SNF that patient had decreased oral intake and not ambulating, though son states this is her baseline.  Noted recent admission to the hospital on June 7 due to sepsis secondary to infected pressure ulcers on the bilateral lower extremities for which [name] refused IV antibiotics and was discharged home on doxycycline.  [name] followed up with orthopedic surgery and vascular surgery post discharge both of whom recommended bilateral amputation as [name] is not a candidate for revascularization.  We have been consulted for goals of care discussion, specifically related to discordance between patient and family and wishes for amputation.  Of note, the patient has been evaluated by psychiatry and deemed to have capacity to make her healthcare decisions.  Past Medical History:  Diagnosis Date  . Alcohol use   . Ankle fracture, lateral malleolus, closed 2013  . Anxiety   . Aortic atherosclerosis ([place]) 03/10/2021  . Breast mass, left 2013  . CHF (congestive heart failure) ([place])    a. EF 20-25% by echo in 05/2016 with cath showing normal cors b. EF 50-55% in 07/2020 c. 01/2021: EF at 55-60% with moderate LVH  . Chronic anemia   . CKD (chronic kidney disease), stage III ([place])   . Cocaine abuse ([place])   . COPD (chronic obstructive pulmonary disease) ([place])   . Diabetes mellitus, type 2 ([place])   .  Diabetic Charcot foot ([place])   . Essential hypertension   . History of cardiomyopathy   . History of GI bleed 2011  . Hyperlipidemia   . Noncompliance   . Obesity   . PAF (paroxysmal atrial fibrillation) ([place])   . Panic attacks   . PAT (paroxysmal atrial tachycardia) ([facility])    Previously on Amiodarone  . Seizures ([place]) 10/01/2020  . Sleep apnea    Not on CPAP  . Stroke ([place]) 2018  . Tobacco abuse   . Urinary incontinence     Social History   Socioeconomic History  . Marital status: Widowed    Spouse name: Not on file  . Number of children: 2  . Years of education: 9  . Highest education level: 9th grade  Occupational History  . Occupation: Disabled  Tobacco Use  . Smoking status: Some Days    Packs/day: 0.25    Years: 20.00    Pack years: 5.00    Types: Cigarettes    Last attempt to quit: 06/03/2017    Years since quitting: 3.9  . Smokeless tobacco: Never  Vaping Use  . Vaping Use: Never used  Substance and Sexual Activity  . Alcohol use: Not Currently    Alcohol/week: 0.0 standard drinks    Comment: Quit in 2017  . Drug use: Not Currently    Frequency: 3.0 times per week    Types: Marijuana, Cocaine    Comment: none since August 2018  .  Sexual activity: Not on file  Other Topics Concern  . Not on file  Social History Narrative   Right handed   Drinks 1-2 cups caffeine daily   Social Determinants of Health   Financial Resource Strain: Medium Risk  . Difficulty of Paying Living Expenses: Somewhat hard  Food Insecurity: Food Insecurity Present  . Worried About Charity fundraiser in the Last Year: Sometimes true  . Ran Out of Food in the Last Year: Sometimes true  Transportation Needs: No Transportation Needs  . Lack of Transportation (Medical): No  . Lack of Transportation (Non-Medical): No  Physical Activity: Insufficiently Active  . Days of Exercise per Week: 7 days  . Minutes of Exercise per Session: 20 min  Stress: Stress Concern Present  . Feeling  of Stress : To some extent  Social Connections: Moderately Isolated  . Frequency of Communication with Friends and Family: More than three times a week  . Frequency of Social Gatherings with Friends and Family: More than three times a week  . Attends Religious Services: More than 4 times per year  . Active Member of Clubs or Organizations: No  . Attends Archivist Meetings: Never  . Marital Status: Widowed    Family History  Problem Relation Age of Onset  . Hypertension Mother   . Heart attack Mother   . Hypertension Father        CABG   . Hypertension Sister   . Hypertension Brother   . Hypertension Sister   . Cancer Sister        breast   . Arthritis Other   . Cancer Other   . Diabetes Other   . Asthma Other   . Hypertension Daughter   . Hypertension Son     Subjective:   This NP Walden Field reviewed medical records, received report from team, assessed the patient and then meet at the patient's bedside  to discuss diagnosis, prognosis, GOC, EOL wishes disposition and options.   Concept of Palliative Care was introduced as specialized medical care for people and their families living with serious illness.  If focuses on providing relief from the symptoms and stress of a serious illness.  The goal is to improve quality of life for both the patient and the family. Values and goals of care important to patient and family were attempted to be elicited.  Created space and opportunity for patient  and family to explore thoughts and feelings regarding current medical situation. Questions and concerns addressed. Patient  encouraged to call with questions or concerns.    Life Review: The patient states [name] lives alone.  Her beloved [place] recently passed away.  Her son lives about 30 minutes away in [place] and visits as often as he can, daughter lives in [place] and does not visit very much.  Sense of a strained relationship was felt.  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  Her hobbies include singing, playing with Ambreen, and spending time with family including her 5 grandkids.  1 grandson in particular [name] refers to as "my little sunflower".  Patient Values: Primarily family  Patient/Family Understanding of Illness: The patient understands that her leg pain is because of the poor circulation and that they have recommended amputation.  [name] states that [name] does not want to have the amputations.  [name] states "God is good, and he gave me my legs and I want to keep  them."  I discussed with the patient that in the case that [name] has recurrent infections which [name] still declined amputation, which [name] states [name] would.  [name] would want treatment for the infection including antibiotics.  I informed her that there may come a point where the infections are so frequent or untreatable that they could end her life.  [name] again confirms that [name] would not want amputation and states "if it is my time, then is my time."  I asked if [name] has talked to her son or other family members about this.  [name] states that her son only wants with the doctors want, but [name] does not want amputation.  Review of Systems  Respiratory:  Negative for cough and shortness of breath.   Cardiovascular:  Negative for chest pain.  Gastrointestinal:  Negative for abdominal pain.  Musculoskeletal:        Leg pain   Objective:   Primary Diagnoses: Present on Admission: . (Resolved) Sepsis ([place]) . Essential hypertension . Insomnia . Poor mobility . HTN (hypertension), malignant . Diabetic neuropathy ([place]) . (Resolved) Acute renal failure superimposed on stage 3b chronic kidney disease ([place]) . MDD (major depressive disorder), recurrent, severe, with psychosis ([place]) . COPD (chronic obstructive pulmonary disease) ([place]) . PAF (paroxysmal atrial fibrillation) ([place]) . Atrial fibrillation with RVR ([place]) . Sacral ulcer, limited to  breakdown of skin ([place]) . Multiple duodenal ulcers . Chronic respiratory failure with hypoxia ([place]) . (Resolved) Hypomagnesemia . (Resolved) Bleeding hemorrhoids . Urinary incontinence . Pressure injury of skin   Scheduled Meds: . amLODipine  10 mg Oral Daily  . aspirin EC  81 mg Oral Daily  . benztropine  0.5 mg Oral BID  . Chlorhexidine Gluconate Cloth  6 each Topical Daily  . feeding supplement  237 mL Oral TID BM  . FLUoxetine  20 mg Oral Daily  . haloperidol  5 mg Oral TID  . insulin aspart  0-5 Units Subcutaneous QHS  . insulin aspart  0-9 Units Subcutaneous TID WC  . insulin glargine-yfgn  10 Units Subcutaneous Daily  . levETIRAcetam  1,500 mg Oral Daily  . megestrol  400 mg Oral Daily  . metoprolol succinate  75 mg Oral Daily  . multivitamin with minerals  1 tablet Oral Daily  . nicotine  7 mg Transdermal Daily  . oxybutynin  5 mg Oral Daily  . pantoprazole  40 mg Oral Daily  . rivaroxaban  20 mg Oral QPC breakfast  . rosuvastatin  10 mg Oral Daily  . senna-docusate  1 tablet Oral BID  . traZODone  50 mg Oral QHS  . umeclidinium bromide  1 puff Inhalation Daily  . zinc sulfate  220 mg Oral Daily    Continuous Infusions: . lactated ringers 10 mL/hr at 04/30/21 1255    PRN Meds: acetaminophen **OR** acetaminophen, lip balm, LORazepam, ondansetron **OR** ondansetron (ZOFRAN) IV, oxyCODONE-acetaminophen, sodium chloride flush  No Known Allergies  Physical Exam Vitals and nursing note reviewed.  Constitutional:      General: [name] is not in acute distress.    Appearance: [name] is obese.  HENT:     Head: Normocephalic and atraumatic.  Cardiovascular:     Rate and Rhythm: Normal rate.  Pulmonary:     Effort: Pulmonary effort is normal.  Abdominal:     General: Abdomen is flat.     Palpations: Abdomen is soft.  Skin:    General: Skin is warm and dry.  Neurological:     Mental Status:  [name] is alert.    Vital Signs:  BP 118/87 (BP Location: Left Arm)   Pulse  66   Temp (!) 97 F (36.1 C)   Resp 16   Ht '5\' 1"'$  (1.549 m)   Wt 74.7 kg   LMP 05/19/2016   SpO2 98%   BMI 31.12 kg/m  Pain Scale: 0-10 POSS *See Group Information*: 1-Acceptable,Awake and alert Pain Score: 1   SpO2: SpO2: 98 % O2 Device:SpO2: 98 % O2 Flow Rate: .O2 Flow Rate (L/min): 2 L/min  IO: Intake/output summary:  Intake/Output Summary (Last 24 hours) at 05/28/2021 1348 Last data filed at 05/27/2021 1500 Gross per 24 hour  Intake 240 ml  Output --  Net 240 ml    LBM: Last BM Date: 05/24/21 Baseline Weight: Weight: 77 kg Most recent weight: Weight: 74.7 kg      Palliative Assessment/Data: 50%   Advanced Care Planning:   Primary Decision Maker: PATIENT  Code Status/Advance Care Planning: Full code  A discussion was had today regarding advanced directives. Concepts specific to code status, artifical feeding and hydration, continued IV antibiotics and rehospitalization was had.  The difference between a aggressive medical intervention path and a palliative comfort care path for this patient at this time was had. The MOST form was introduced and discussed.We began completing the MOST form to include full scope of care EXCEPT: no leg amputation and no feeding tube. [name] wants her son to look at the MOST form before [name] signs it.  Decisions/Changes to ACP: Remain full code Continues to decline amputation Declines feeding tubs  Assessment & Plan:   I have reviewed the medical record, interviewed the patient and family, and examined the patient. The following aspects are pertinent.  Impression: Pleasant 55 year old female who presents for bilateral leg pain deemed to be due to severe PVD with no options for revascularization.  Surgery and vein and vascular has recommended bilateral amputation which [name] continues to decline.  [name] seems quite adamant when I asked her about 3 times that [name] does not want amputation.  [name] also declines tube feedings.  Otherwise, [name] wants  full scope of care including antibiotics, fluids, hospital admission, ICU if needed.  As of now the plans are to discharge to a SNF ([facility] versus long-term care).  Transition of care team is working on this.  SUMMARY OF RECOMMENDATIONS   Discussed MOST form with son, hopefully in person Complete MOST form to express patient wishes Continue to treat the treatable Declines amputation at this time, declines feeding tube Full scope of care otherwise  Symptom Management:  Per primary team Palliative team available to assist with symptom management if needed  Palliative Prophylaxis:  Frequent Pain Assessment  Additional Recommendations (Limitations, Scope, Preferences): No Artificial Feeding and no leg amputation  Psycho-social/Spiritual:  Desire for further Chaplaincy support: yes Additional Recommendations: Caregiving  Support/Resources and Grief/Bereavement Support  Prognosis:  Unable to determine  Discharge Planning:  To Be Determined   Discussed with:     Thank you for allowing [name] to participate in the care of [name] PMT will continue to support holistically.  Time In: 1:15 pm Time Out: 2:30 pm Time Total: 75 mins  Greater than 50%  of this time was spent counseling and coordinating care related to the above assessment and plan.  Signed by: Walden Field, NP Palliative Medicine Team  Team Phone # [phone] (Nights/Weekends)  05/28/2021, 1:48 PM

## 2021-05-28 NOTE — Progress Notes (Signed)
Patient seen and examined, no significant changes from discharge summary by Dr. Posey Pronto yesterday. -CBGs elevated, will add low-dose Lantus -TOC following for SNF  Domenic Polite, MD

## 2021-05-29 DIAGNOSIS — E1142 Type 2 diabetes mellitus with diabetic polyneuropathy: Secondary | ICD-10-CM | POA: Diagnosis not present

## 2021-05-29 DIAGNOSIS — I70209 Unspecified atherosclerosis of native arteries of extremities, unspecified extremity: Secondary | ICD-10-CM | POA: Diagnosis not present

## 2021-05-29 DIAGNOSIS — Z8669 Personal history of other diseases of the nervous system and sense organs: Secondary | ICD-10-CM | POA: Diagnosis not present

## 2021-05-29 DIAGNOSIS — Y9289 Other specified places as the place of occurrence of the external cause: Secondary | ICD-10-CM | POA: Diagnosis not present

## 2021-05-29 DIAGNOSIS — R32 Unspecified urinary incontinence: Secondary | ICD-10-CM | POA: Diagnosis present

## 2021-05-29 DIAGNOSIS — Z794 Long term (current) use of insulin: Secondary | ICD-10-CM | POA: Diagnosis not present

## 2021-05-29 DIAGNOSIS — G4701 Insomnia due to medical condition: Secondary | ICD-10-CM | POA: Diagnosis not present

## 2021-05-29 DIAGNOSIS — J441 Chronic obstructive pulmonary disease with (acute) exacerbation: Secondary | ICD-10-CM | POA: Diagnosis not present

## 2021-05-29 DIAGNOSIS — I10 Essential (primary) hypertension: Secondary | ICD-10-CM | POA: Diagnosis not present

## 2021-05-29 DIAGNOSIS — G9341 Metabolic encephalopathy: Secondary | ICD-10-CM | POA: Diagnosis not present

## 2021-05-29 DIAGNOSIS — E119 Type 2 diabetes mellitus without complications: Secondary | ICD-10-CM | POA: Diagnosis not present

## 2021-05-29 DIAGNOSIS — E0840 Diabetes mellitus due to underlying condition with diabetic neuropathy, unspecified: Secondary | ICD-10-CM | POA: Diagnosis not present

## 2021-05-29 DIAGNOSIS — Z515 Encounter for palliative care: Secondary | ICD-10-CM | POA: Diagnosis not present

## 2021-05-29 DIAGNOSIS — E1151 Type 2 diabetes mellitus with diabetic peripheral angiopathy without gangrene: Secondary | ICD-10-CM | POA: Diagnosis not present

## 2021-05-29 DIAGNOSIS — R404 Transient alteration of awareness: Secondary | ICD-10-CM | POA: Diagnosis not present

## 2021-05-29 DIAGNOSIS — Z8616 Personal history of COVID-19: Secondary | ICD-10-CM | POA: Diagnosis not present

## 2021-05-29 DIAGNOSIS — E1122 Type 2 diabetes mellitus with diabetic chronic kidney disease: Secondary | ICD-10-CM | POA: Diagnosis not present

## 2021-05-29 DIAGNOSIS — E1165 Type 2 diabetes mellitus with hyperglycemia: Secondary | ICD-10-CM | POA: Diagnosis not present

## 2021-05-29 DIAGNOSIS — G459 Transient cerebral ischemic attack, unspecified: Secondary | ICD-10-CM | POA: Diagnosis not present

## 2021-05-29 DIAGNOSIS — I5030 Unspecified diastolic (congestive) heart failure: Secondary | ICD-10-CM | POA: Diagnosis not present

## 2021-05-29 DIAGNOSIS — G4700 Insomnia, unspecified: Secondary | ICD-10-CM | POA: Diagnosis not present

## 2021-05-29 DIAGNOSIS — N3001 Acute cystitis with hematuria: Secondary | ICD-10-CM | POA: Diagnosis not present

## 2021-05-29 DIAGNOSIS — N390 Urinary tract infection, site not specified: Secondary | ICD-10-CM | POA: Diagnosis not present

## 2021-05-29 DIAGNOSIS — N1831 Chronic kidney disease, stage 3a: Secondary | ICD-10-CM | POA: Diagnosis not present

## 2021-05-29 DIAGNOSIS — L98429 Non-pressure chronic ulcer of back with unspecified severity: Secondary | ICD-10-CM | POA: Diagnosis not present

## 2021-05-29 DIAGNOSIS — E860 Dehydration: Secondary | ICD-10-CM | POA: Diagnosis not present

## 2021-05-29 DIAGNOSIS — R6520 Severe sepsis without septic shock: Secondary | ICD-10-CM | POA: Diagnosis not present

## 2021-05-29 DIAGNOSIS — Z7189 Other specified counseling: Secondary | ICD-10-CM | POA: Diagnosis not present

## 2021-05-29 DIAGNOSIS — J9611 Chronic respiratory failure with hypoxia: Secondary | ICD-10-CM | POA: Diagnosis not present

## 2021-05-29 DIAGNOSIS — M79641 Pain in right hand: Secondary | ICD-10-CM | POA: Diagnosis not present

## 2021-05-29 DIAGNOSIS — M79642 Pain in left hand: Secondary | ICD-10-CM | POA: Diagnosis not present

## 2021-05-29 DIAGNOSIS — I639 Cerebral infarction, unspecified: Secondary | ICD-10-CM | POA: Diagnosis not present

## 2021-05-29 DIAGNOSIS — Z743 Need for continuous supervision: Secondary | ICD-10-CM | POA: Diagnosis not present

## 2021-05-29 DIAGNOSIS — G928 Other toxic encephalopathy: Secondary | ICD-10-CM | POA: Diagnosis not present

## 2021-05-29 DIAGNOSIS — I4891 Unspecified atrial fibrillation: Secondary | ICD-10-CM | POA: Diagnosis not present

## 2021-05-29 DIAGNOSIS — M47812 Spondylosis without myelopathy or radiculopathy, cervical region: Secondary | ICD-10-CM | POA: Diagnosis not present

## 2021-05-29 DIAGNOSIS — W01198A Fall on same level from slipping, tripping and stumbling with subsequent striking against other object, initial encounter: Secondary | ICD-10-CM | POA: Diagnosis not present

## 2021-05-29 DIAGNOSIS — R269 Unspecified abnormalities of gait and mobility: Secondary | ICD-10-CM | POA: Diagnosis not present

## 2021-05-29 DIAGNOSIS — I739 Peripheral vascular disease, unspecified: Secondary | ICD-10-CM | POA: Diagnosis not present

## 2021-05-29 DIAGNOSIS — N182 Chronic kidney disease, stage 2 (mild): Secondary | ICD-10-CM | POA: Diagnosis not present

## 2021-05-29 DIAGNOSIS — R5381 Other malaise: Secondary | ICD-10-CM | POA: Diagnosis not present

## 2021-05-29 DIAGNOSIS — A419 Sepsis, unspecified organism: Secondary | ICD-10-CM | POA: Diagnosis not present

## 2021-05-29 DIAGNOSIS — I499 Cardiac arrhythmia, unspecified: Secondary | ICD-10-CM | POA: Diagnosis not present

## 2021-05-29 DIAGNOSIS — M5459 Other low back pain: Secondary | ICD-10-CM | POA: Diagnosis not present

## 2021-05-29 DIAGNOSIS — L89153 Pressure ulcer of sacral region, stage 3: Secondary | ICD-10-CM | POA: Diagnosis not present

## 2021-05-29 DIAGNOSIS — I517 Cardiomegaly: Secondary | ICD-10-CM | POA: Diagnosis not present

## 2021-05-29 DIAGNOSIS — N179 Acute kidney failure, unspecified: Secondary | ICD-10-CM | POA: Diagnosis not present

## 2021-05-29 DIAGNOSIS — R000 Tachycardia, unspecified: Secondary | ICD-10-CM | POA: Diagnosis not present

## 2021-05-29 DIAGNOSIS — E11610 Type 2 diabetes mellitus with diabetic neuropathic arthropathy: Secondary | ICD-10-CM | POA: Diagnosis not present

## 2021-05-29 DIAGNOSIS — I480 Paroxysmal atrial fibrillation: Secondary | ICD-10-CM | POA: Diagnosis not present

## 2021-05-29 DIAGNOSIS — D638 Anemia in other chronic diseases classified elsewhere: Secondary | ICD-10-CM | POA: Diagnosis not present

## 2021-05-29 DIAGNOSIS — G40909 Epilepsy, unspecified, not intractable, without status epilepticus: Secondary | ICD-10-CM | POA: Diagnosis present

## 2021-05-29 DIAGNOSIS — R6889 Other general symptoms and signs: Secondary | ICD-10-CM | POA: Diagnosis not present

## 2021-05-29 DIAGNOSIS — W06XXXA Fall from bed, initial encounter: Secondary | ICD-10-CM | POA: Diagnosis not present

## 2021-05-29 DIAGNOSIS — U071 COVID-19: Secondary | ICD-10-CM | POA: Diagnosis not present

## 2021-05-29 DIAGNOSIS — N183 Chronic kidney disease, stage 3 unspecified: Secondary | ICD-10-CM | POA: Diagnosis not present

## 2021-05-29 DIAGNOSIS — J449 Chronic obstructive pulmonary disease, unspecified: Secondary | ICD-10-CM | POA: Diagnosis not present

## 2021-05-29 DIAGNOSIS — F17210 Nicotine dependence, cigarettes, uncomplicated: Secondary | ICD-10-CM | POA: Diagnosis not present

## 2021-05-29 DIAGNOSIS — I5042 Chronic combined systolic (congestive) and diastolic (congestive) heart failure: Secondary | ICD-10-CM | POA: Diagnosis not present

## 2021-05-29 DIAGNOSIS — F0390 Unspecified dementia without behavioral disturbance: Secondary | ICD-10-CM | POA: Diagnosis present

## 2021-05-29 DIAGNOSIS — K219 Gastro-esophageal reflux disease without esophagitis: Secondary | ICD-10-CM | POA: Diagnosis not present

## 2021-05-29 DIAGNOSIS — Z7901 Long term (current) use of anticoagulants: Secondary | ICD-10-CM | POA: Diagnosis not present

## 2021-05-29 DIAGNOSIS — K269 Duodenal ulcer, unspecified as acute or chronic, without hemorrhage or perforation: Secondary | ICD-10-CM | POA: Diagnosis not present

## 2021-05-29 DIAGNOSIS — Z79899 Other long term (current) drug therapy: Secondary | ICD-10-CM | POA: Diagnosis not present

## 2021-05-29 DIAGNOSIS — F32A Depression, unspecified: Secondary | ICD-10-CM | POA: Diagnosis not present

## 2021-05-29 DIAGNOSIS — M6281 Muscle weakness (generalized): Secondary | ICD-10-CM | POA: Diagnosis not present

## 2021-05-29 DIAGNOSIS — Y900 Blood alcohol level of less than 20 mg/100 ml: Secondary | ICD-10-CM | POA: Diagnosis not present

## 2021-05-29 DIAGNOSIS — Z7401 Bed confinement status: Secondary | ICD-10-CM | POA: Diagnosis not present

## 2021-05-29 DIAGNOSIS — E785 Hyperlipidemia, unspecified: Secondary | ICD-10-CM | POA: Diagnosis not present

## 2021-05-29 DIAGNOSIS — M2578 Osteophyte, vertebrae: Secondary | ICD-10-CM | POA: Diagnosis not present

## 2021-05-29 DIAGNOSIS — Z9189 Other specified personal risk factors, not elsewhere classified: Secondary | ICD-10-CM | POA: Diagnosis not present

## 2021-05-29 DIAGNOSIS — I130 Hypertensive heart and chronic kidney disease with heart failure and stage 1 through stage 4 chronic kidney disease, or unspecified chronic kidney disease: Secondary | ICD-10-CM | POA: Diagnosis not present

## 2021-05-29 DIAGNOSIS — R519 Headache, unspecified: Secondary | ICD-10-CM | POA: Diagnosis not present

## 2021-05-29 DIAGNOSIS — I96 Gangrene, not elsewhere classified: Secondary | ICD-10-CM | POA: Diagnosis not present

## 2021-05-29 DIAGNOSIS — W19XXXA Unspecified fall, initial encounter: Secondary | ICD-10-CM | POA: Diagnosis not present

## 2021-05-29 DIAGNOSIS — Z043 Encounter for examination and observation following other accident: Secondary | ICD-10-CM | POA: Diagnosis not present

## 2021-05-29 DIAGNOSIS — M25521 Pain in right elbow: Secondary | ICD-10-CM | POA: Diagnosis not present

## 2021-05-29 DIAGNOSIS — R0902 Hypoxemia: Secondary | ICD-10-CM | POA: Diagnosis not present

## 2021-05-29 DIAGNOSIS — S0990XA Unspecified injury of head, initial encounter: Secondary | ICD-10-CM | POA: Diagnosis not present

## 2021-05-29 DIAGNOSIS — M40202 Unspecified kyphosis, cervical region: Secondary | ICD-10-CM | POA: Diagnosis not present

## 2021-05-29 DIAGNOSIS — N1832 Chronic kidney disease, stage 3b: Secondary | ICD-10-CM | POA: Diagnosis not present

## 2021-05-29 DIAGNOSIS — I482 Chronic atrial fibrillation, unspecified: Secondary | ICD-10-CM | POA: Diagnosis not present

## 2021-05-29 DIAGNOSIS — R410 Disorientation, unspecified: Secondary | ICD-10-CM | POA: Diagnosis not present

## 2021-05-29 DIAGNOSIS — E876 Hypokalemia: Secondary | ICD-10-CM | POA: Diagnosis not present

## 2021-05-29 DIAGNOSIS — I872 Venous insufficiency (chronic) (peripheral): Secondary | ICD-10-CM | POA: Diagnosis not present

## 2021-05-29 DIAGNOSIS — Z7982 Long term (current) use of aspirin: Secondary | ICD-10-CM | POA: Diagnosis not present

## 2021-05-29 DIAGNOSIS — I429 Cardiomyopathy, unspecified: Secondary | ICD-10-CM | POA: Diagnosis not present

## 2021-05-29 DIAGNOSIS — I700 Atherosclerosis of aorta: Secondary | ICD-10-CM | POA: Diagnosis not present

## 2021-05-29 DIAGNOSIS — R8290 Unspecified abnormal findings in urine: Secondary | ICD-10-CM | POA: Diagnosis not present

## 2021-05-29 DIAGNOSIS — R569 Unspecified convulsions: Secondary | ICD-10-CM | POA: Diagnosis not present

## 2021-05-29 DIAGNOSIS — R7881 Bacteremia: Secondary | ICD-10-CM | POA: Diagnosis not present

## 2021-05-29 DIAGNOSIS — R296 Repeated falls: Secondary | ICD-10-CM | POA: Diagnosis not present

## 2021-05-29 DIAGNOSIS — S199XXA Unspecified injury of neck, initial encounter: Secondary | ICD-10-CM | POA: Diagnosis not present

## 2021-05-29 DIAGNOSIS — Z7984 Long term (current) use of oral hypoglycemic drugs: Secondary | ICD-10-CM | POA: Diagnosis not present

## 2021-05-29 DIAGNOSIS — J9691 Respiratory failure, unspecified with hypoxia: Secondary | ICD-10-CM | POA: Diagnosis not present

## 2021-05-29 DIAGNOSIS — R8279 Other abnormal findings on microbiological examination of urine: Secondary | ICD-10-CM | POA: Diagnosis not present

## 2021-05-29 DIAGNOSIS — D631 Anemia in chronic kidney disease: Secondary | ICD-10-CM | POA: Diagnosis not present

## 2021-05-29 LAB — GLUCOSE, CAPILLARY
Glucose-Capillary: 127 mg/dL — ABNORMAL HIGH (ref 70–99)
Glucose-Capillary: 183 mg/dL — ABNORMAL HIGH (ref 70–99)
Glucose-Capillary: 172 mg/dL — ABNORMAL HIGH (ref 70–99)

## 2021-05-29 LAB — RESP PANEL BY RT-PCR (FLU A&B, COVID) ARPGX2
Influenza A by PCR: NEGATIVE
Influenza B by PCR: NEGATIVE
SARS Coronavirus 2 by RT PCR: NEGATIVE

## 2021-05-29 MED ORDER — HALOPERIDOL 5 MG PO TABS: 5.0000 mg | ORAL_TABLET | Freq: Two times a day (BID) | ORAL | 0 refills | Status: DC

## 2021-05-29 MED ORDER — INSULIN GLARGINE-YFGN 100 UNIT/ML ~~LOC~~ SOLN: 8.0000 [IU] | Freq: Every day | SUBCUTANEOUS | Status: DC

## 2021-05-29 NOTE — Discharge Summary (Addendum)
[facility] Discharge Summary   Patient: [name] [id number]  PCP: Caprice Renshaw, MD  Date of admission: 04/21/2021   Date of discharge:  05/29/2021        Recommendations for Outpatient Follow-up:  PCP: please follow up with PCP in 1week, Dr Sharol Given in 2 weeks, vascular surgery as recommended  [place] for goals of care  Work on advance directive paperwork   Discharge Diagnoses:   Sepsis present on admission, secondary to lower leg cellulitis and gangrene secondary to staph capitis bacteremia  Severe PAD   DMII (diabetes mellitus, type 2) ([place])   Essential hypertension   Insomnia   Medical non-compliance   Poor mobility   HTN (hypertension), malignant   Diabetic neuropathy (HCC)   MDD (major depressive disorder), recurrent, severe, with psychosis ([place])   Urinary incontinence   COPD (chronic obstructive pulmonary disease) (HCC)   PAF (paroxysmal atrial fibrillation) (HCC)   Atrial fibrillation with RVR (HCC)   Pressure injury of skin   Seizure disorder ([place])   Sacral ulcer, limited to breakdown of skin ([place])   Multiple duodenal ulcers   Chronic respiratory failure with hypoxia ([place])   Atherosclerosis of native artery of both legs with gangrene ([place])   GERD (gastroesophageal reflux disease)   Admitted From: ALF Disposition:  ALF/ILF     Follow-up Information     [facility] Follow up in 1 month(s).   Why: The office will call the patient with an appointment Contact information: [place]. Schedule an appointment as soon as possible for a visit in 2 day(s).   Why: You will need a provider to prescribe your haldol, prozac, and cogentin at follow-up. Contact information: [address] [place] [address]        Caprice Renshaw, MD. Schedule an appointment as soon as possible for a visit in 1  week(s).   Specialty: Internal Medicine Contact information: [place] [address] [phone]                Discharge Instructions     Diet - low sodium heart healthy   Complete by: As directed    Discharge wound care:   Complete by: As directed    Bilateral leg ulcers: cleansing once daily with NS, gently patting dry and covering with folded xeroform gauze.  This would be covered with an ABD pad and secured with a few turns of Kerlix roll gauze/paper tape.  The heels should be placed into Prevalon Boots  to prevent pressure injury.   Increase activity slowly   Complete by: As directed        Diet recommendation: Carb modified diet  Activity: The patient is advised to gradually reintroduce usual activities, as tolerated  Discharge Condition: stable  Code Status: Full code   History of present illness: As per the H and P dictated on admission, "[name] is a 55 y.o. female with history of tobacco use disorder, severe PVD, paroxysmal atrial fibrillation currently on anticoagulant, hyperlipidemia, hypertension, uncontrolled T2DM, CHF, CKD, and more presents to the ED with a chief complaint of bilateral leg pain.  Chart review showed that patient was recently admitted to the hospital on 6/07 and discharged 6/10 due to sepsis 2/2 secondary to infected pressure ulcers on the bilateral lower extremities. She refused IV antibiotics after day 3 and was discharged  home on doxycycline. To this, she states that she wanted to hasten discharge process due to "wanting to smoke a cigarette." Subsequently, she followed up with orthopedic surgery (Dr. Sharol Given) and vascular surgery (Dr. Oneida Alar) who both recommended bilateral amputation   Hospital Course:  Past medical history of chronic A. fib on Eliquis, seizure, tobacco abuse, COPD, PVD, HTN, type II DM, HFpEF, CKD 3B.  Presented with bilateral leg pain from her nursing facility.  Found to have worsening PVD and underwent  right lower extremity revascularization on 04/23/21 with SFA and popliteal stenting. Also found to have MSSA bacteremia for which he received IV antibiotics. Recommendation from Orthopedics and vascular surgery was for bilateral BKA. Pt refused this.Psychiatry was consulted.  Initially felt that the patient does not have any capacity to make medical decision but later on psychiatry felt that the patient does have capacity to make medical decision.  Summary of her active problems in the hospital is as following.  1.  Sepsis present on admission, secondary to lower leg cellulitis and gangrene secondary to staph capitis bacteremia Met SIRS criteria on admission with leukocytosis, tachypnea and tachycardia. ID was consulted. Treated with vancomycin.  Completed therapy on 7/14. Currently CRP significantly better.  Sepsis physiology resolved.   2.  Peripheral vascular disease with gangrene bilateral lower extremity Vascular surgery was consulted. Underwent aortogram on 6/29 with right superficial femoral/popliteal artery stenting. Left lower extremity angiogram on 7/6. Vascular surgery recommends bilateral BKA for this patient as there is no option for revascularization for her bilateral dry gangrene. Pt has adamantly refused amputation Currently vascular surgery has signed off. High risk of recurrent infections and other complications, needs FU with orthopedics in 2 weeks   3.  Major depressive disorder, recurrent, severe with psychosis Traits of schizoid personality Grief Psychiatry was consulted for capacity evaluation. Initially patient was felt to be lacking capacity to understand medical condition and decision making. On 7/25 psychiatry on reevaluation felt that the patient does have capacity to make medical decision.  Continuing Atarax 25 mg 3 times daily for anxiety Nicotine patch Continue prozac, Haldol, Cogentin  Outpatient psychiatry follow-up recommended as well.   4.  Chronic  A. fib, RVR HTN HFpEF On Eliquis and metoprolol.   the patient has occasional poor compliance with twice daily regimen, hence Dr. Posey Pronto switched her from twice daily Eliquis to daily Xarelto -Also change Lopressor to Toprol-XL -Continue Norvasc   5.  CKD 2 Baseline serum creatinine 1.4.  On presentation serum creatinine remained the same although during therapy in the hospital serum creatinine has improved and normalized. Could have AKI although does not meet criteria for diagnosis.    6.  History of seizure disorder Continue Keppra 750 mg twice daily.   7.  GERD. Continuing PPI.   8.  Chronic respiratory failure with hypoxia. COPD Continuing chronic O2.  Does not appear to have any exacerbation.   9.  Poor dentition. Patient will follow-up with dentistry outpatient. Patient was placed on dysphagia 3 diet for ease of intake. Will continue regular diet for now as patient is unhappy with her current regimen in the hospital.   10.  Type 2 diabetes mellitus, uncontrolled with hyperglycemia.  With long-term insulin use and PVD and CKD. Hemoglobin A1c 11.1 on 5/29. -Resume home regimen including NovoLog sliding scale, added low-dose Lantus 8 units daily   11.  Anemia of chronic disease -Now stable   12.  Medication noncompliance. -Counseled  14. Goals of care  Seen by palliative  care, in summary Admitted with dry gangrene and bilateral leg pain deemed to be due to severe PVD with no options for revascularization.  Surgery and Vascular has recommended bilateral amputation which she continues to decline.   She also declines tube feedings.  Otherwise, she wants full scope of care including antibiotics, fluids, hospital admission, ICU if needed.  As of now the plans are to discharge to a SNF ( long-term care).   Body mass index is 31.12 kg/m.  Nutrition Problem: Increased nutrient needs Etiology: wound healing Nutrition Interventions: Interventions: Ensure Enlive (each supplement  provides 350kcal and 20 grams of protein), MVI, Liberalize Diet, Magic cup  Pressure Injury 12/03/20 Perineum Posterior Stage 1 -  Intact skin with non-blanchable redness of a localized area usually over a bony prominence. (Active)  12/03/20 2210  Location: Perineum  Location Orientation: Posterior  Staging: Stage 1 -  Intact skin with non-blanchable redness of a localized area usually over a bony prominence.  Wound Description (Comments):   Present on Admission: Yes     Pressure Injury 12/04/20 Coccyx Right;Medial Stage 2 -  Partial thickness loss of dermis presenting as a shallow open injury with a red, pink wound bed without slough. 1cm x 0.5cm area to the right of previously healed pressure ulcer (Active)  12/04/20 0800  Location: Coccyx  Location Orientation: Right;Medial  Staging: Stage 2 -  Partial thickness loss of dermis presenting as a shallow open injury with a red, pink wound bed without slough.  Wound Description (Comments): 1cm x 0.5cm area to the right of previously healed pressure ulcer  Present on Admission: Yes     Pain control  - [place] was reviewed. - 5 day supply was provided. - Patient was instructed, not to drive, operate heavy machinery, perform activities at heights, swimming or participation in water activities or provide baby sitting services while on Pain, Sleep and Anxiety Medications; until her outpatient Physician has advised to do so again.  - Also recommended to not to take more than prescribed Pain, Sleep and Anxiety Medications.  On the day of the discharge the patient's vitals were stable, and no other new acute medical condition were reported. The patient was felt safe to be discharge at long term care SNF  Consultants: psychiatry, vascular surgery, orthopedics  Procedures:  PROCEDURE:   1) US guided right common femoral access 2) left lower extremity angiogram with second order cannulation     PROCEDURE:   1) US guided left common femoral artery access 2) Aortogram 3) left lower extremity angiogram 4) right lower extremity angiogram with third order cannulation (56m total contrast) 5) right superficial femoral / popliteal artery stenting (6x1235m+ 6x6030mluvia)  DISCHARGE MEDICATION: Allergies as of 05/29/2021   No Known Allergies      Medication List     STOP taking these medications    DULoxetine 60 MG capsule Commonly known as: CYMBALTA   Eliquis 5 MG Tabs tablet Generic drug: apixaban   furosemide 40 MG tablet Commonly known as: Lasix   metoprolol tartrate 50 MG tablet Commonly known as: LOPRESSOR   oxyCODONE 5 MG immediate release tablet Commonly known as: Oxy IR/ROXICODONE   silver sulfADIAZINE 1 % cream Commonly known as: SILVADENE       TAKE these medications    acetaminophen 325 MG tablet Commonly known as: TYLENOL Take 2 tablets (650 mg total) by mouth every 6 (six) hours as needed for headache, fever or mild pain.  Alogliptin Benzoate 25 MG Tabs Take 1 tablet by mouth daily.   amLODipine 10 MG tablet Commonly known as: NORVASC Take 1 tablet (10 mg total) by mouth daily.   aspirin 81 MG EC tablet Take 1 tablet (81 mg total) by mouth daily. Swallow whole.   benztropine 0.5 MG tablet Commonly known as: COGENTIN Take 1 tablet (0.5 mg total) by mouth 2 (two) times daily.   feeding supplement Liqd Take 237 mLs by mouth 3 (three) times daily between meals.   FLUoxetine 20 MG capsule Commonly known as: PROZAC Take 1 capsule (20 mg total) by mouth daily.   haloperidol 5 MG tablet Commonly known as: HALDOL Take 1 tablet (5 mg total) by mouth 2 (two) times daily.   HumaLOG KwikPen 100 UNIT/ML KwikPen Generic drug: insulin lispro Inject 1-10 Units into the skin See admin instructions. 151-200 =1u, 201-250= 2u, 251-300= 4u, 301-350= 6u, 351-400= 8u, 401-450= 10u   insulin glargine-yfgn 100 UNIT/ML injection Commonly known as:  SEMGLEE Inject 0.08 mLs (8 Units total) into the skin daily. Start taking on: May 30, 2021   levETIRAcetam 750 MG tablet Commonly known as: KEPPRA Take 1 tablet (750 mg total) by mouth 2 (two) times daily.   LORazepam 0.5 MG tablet Commonly known as: ATIVAN Take 1 tablet (0.5 mg total) by mouth 3 (three) times daily as needed for anxiety.   metoprolol succinate 25 MG 24 hr tablet Commonly known as: TOPROL-XL Take 3 tablets (75 mg total) by mouth daily.   nicotine 7 mg/24hr patch Commonly known as: NICODERM CQ - dosed in mg/24 hr Place 1 patch (7 mg total) onto the skin daily.   omeprazole 20 MG capsule Commonly known as: PRILOSEC Take 20 mg by mouth daily.   oxybutynin 5 MG 24 hr tablet Commonly known as: DITROPAN-XL Take 5 mg by mouth at bedtime.   oxyCODONE-acetaminophen 5-325 MG tablet Commonly known as: PERCOCET/ROXICET Take 1 tablet by mouth every 6 (six) hours as needed for moderate pain or severe pain.   rivaroxaban 20 MG Tabs tablet Commonly known as: XARELTO Take 1 tablet (20 mg total) by mouth daily after breakfast.   rosuvastatin 10 MG tablet Commonly known as: CRESTOR Take 10 mg by mouth daily.   Spiriva HandiHaler 18 MCG inhalation capsule Generic drug: tiotropium Place 18 mcg into inhaler and inhale daily as needed (Shortness of breath).   traZODone 50 MG tablet Commonly known as: DESYREL Take 1 tablet (50 mg total) by mouth at bedtime.   Trulicity 3 PO/2.4MP Sopn Generic drug: Dulaglutide Inject 3 mg into the skin See admin instructions. Inject 0.5 mls subcutaneously one time a day every Friday   Zinc Oxide 10 % Aero Apply 1 application topically daily.   zinc sulfate 220 (50 Zn) MG capsule Take 1 capsule (220 mg total) by mouth daily.               Discharge Care Instructions  (From admission, onward)           Start     Ordered   05/27/21 0000  Discharge wound care:       Comments: Bilateral leg ulcers: cleansing once daily  with NS, gently patting dry and covering with folded xeroform gauze.  This would be covered with an ABD pad and secured with a few turns of Kerlix roll gauze/paper tape.  The heels should be placed into Prevalon Boots  to prevent pressure injury.   05/27/21 1430  Discharge Exam: Filed Weights   04/30/21 0615 04/30/21 1236 05/01/21 0606  Weight: 75 kg 75 kg 74.7 kg   Vitals:   05/28/21 2200 05/29/21 0923  BP: 109/77 127/89  Pulse: 100 89  Resp: 17 17  Temp: 98.6 F (37 C) 99 F (37.2 C)  SpO2: 98%    Gen: Awake, Alert, Oriented X 3,  HEENT: no JVD Lungs: Good air movement bilaterally, CTAB CVS: S1S2/RRR Abd: soft, Non tender, non distended, BS present Extremities: Foot/leg ulcers with dressing  Neurology: alert and oriented to time, place, and person affect flat in affect. no new focal deficit  The results of significant diagnostics from this hospitalization (including imaging, microbiology, ancillary and laboratory) are listed below for reference.    Significant Diagnostic Studies: [name] EKG SITE RITE  Result Date: 05/04/2021 If Site Rite image not attached, placement could not be confirmed due to current cardiac rhythm.  HYBRID OR IMAGING ([facility] ONLY)  Result Date: 04/30/2021 There is no interpretation for this exam.  This order is for images obtained during a surgical procedure.  Please See "Surgeries" Tab for more information regarding the procedure.    Microbiology: No results found for this or any previous visit (from the past 240 hour(s)).   Labs: CBC: Recent Labs  Lab 05/24/21 0402  WBC 10.9*  HGB 8.4*  HCT 27.3*  MCV 93.2  PLT 282   Basic Metabolic Panel: Recent Labs  Lab 05/24/21 0402  NA 140  K 3.5  CL 106  CO2 27  GLUCOSE 107*  BUN 12  CREATININE 1.05*  CALCIUM 8.3*   Liver Function Tests: No results for input(s): AST, ALT, ALKPHOS, BILITOT, PROT, ALBUMIN in the last 168 hours. CBG: Recent Labs  Lab 05/28/21 0634 05/28/21 1108  05/28/21 1737 05/28/21 2008 05/29/21 0752  GLUCAP 209* 345* 251* 169* 127*    Time spent: 35 minutes  Signed:  Domenic Polite  [facility]  05/29/2021

## 2021-05-29 NOTE — Plan of Care (Addendum)
  Problem: Clinical Measurements: Goal: Ability to maintain clinical measurements within normal limits will improve Outcome: Progressing Goal: Will remain free from infection Outcome: Progressing   Problem: Activity: Goal: Risk for activity intolerance will decrease Outcome: Progressing   Problem: Nutrition: Goal: Adequate nutrition will be maintained Outcome: Progressing   Problem: Clinical Measurements: Goal: Ability to maintain clinical measurements within normal limits will improve Outcome: Progressing Goal: Will remain free from infection Outcome: Progressing   Problem: Activity: Goal: Risk for activity intolerance will decrease Outcome: Progressing   Problem: Nutrition: Goal: Adequate nutrition will be maintained Outcome: Progressing  0530 Pt refused dressing change. Pt educated.

## 2021-05-29 NOTE — Progress Notes (Signed)
This chaplain responded to PMT consult for spiritual care and completing Pt. HCPOA. The Pt. is awake and welcomed the visit.  The Pt. shared she was cold throughout the visit.  The chaplain phoned the Pt. son-[name] per the Pt. request to share the difference between [facility] and [place].  The Pt. chose not to proceed with HCPOA and naming [name] as healthcare agent until [name] is present. [name] is appreciative of the phone call. The chaplain understands from [place] will visit at 3pm today. The chaplain updated PMT NP-EG.  The chaplain listened reflectively and understands the Pt. is putting her trust in God for healing. The Pt. references in her story telling two previous hospital admissions and God's presence and care.    The Pt. accepted the chaplain's invitation for prayer and F/U spiritual care.

## 2021-05-29 NOTE — Progress Notes (Addendum)
Primary RN and tech at bedside to provide emotional support, patient agreeable to PICC removal. PICC removed without complications. Dressing remain clean/dry/intact.

## 2021-05-29 NOTE — TOC Transition Note (Signed)
Transition of Care [facility]) - CM/SW Discharge Note   Patient Details  Name: [name] MRN: [id number] Date of Birth: May 09, 1966  [facility] of [facility]) CM/SW Contact:  Milinda Antis, LCSWA Phone Number: 05/29/2021, 11:46 AM   Clinical Narrative:    Patient will DC to: [place] Anticipated DC date:  05/29/2021 Family notified: Yes Transport by:  Corey Harold   Per MD patient ready for DC to SNF.   RN to call report prior to discharge [phone]. RN, patient, patient's family (daughter, [name]), and facility notified of DC. Discharge Summary and FL2 sent to facility. DC packet on chart. Ambulance transport will be requested for patient when RN reports that the patient is medically ready.   CSW will sign off for now as social work intervention is no longer needed. Please consult [name] again if new needs arise.     Final next level of care: Skilled Nursing Facility Barriers to Discharge: Barriers Resolved   Patient Goals and CMS Choice        Discharge Placement              Patient chooses bed at:  [facility]) Patient to be transferred to facility by: [place] Name of family member notified: [name] Daughter [phone] Patient and family notified of of transfer: 05/29/21  Discharge Plan and Services                                     Social Determinants of Health (SDOH) Interventions     Readmission Risk Interventions Readmission Risk Prevention Plan 10/31/2020 08/27/2020  Transportation Screening Complete Complete  Medication Review Press photographer) Complete Complete  PCP or Specialist appointment within 3-5 days of discharge - Complete  HRI or [place] - Complete  SW Recovery Care/Counseling Consult Complete Complete  Palliative Care Screening Not Applicable Not Applicable  Skilled Nursing Facility Complete Complete  Some recent data might be hidden

## 2021-05-29 NOTE — Progress Notes (Addendum)
1200- VAST RN to bedside for order for remove PICC, but patient in the middle of other care activity and medications, will return for PICC removal.   1300- Returned to removed PICC, but patient not cooperative at this time. Primary RN notified patient not cooperating. Will return again to attempt PICC removal.   1344- Patient still refusing removal.

## 2021-05-29 NOTE — Progress Notes (Signed)
Pt transported by [facility] to [facility]. Pt stable. Pt denies pain. Pt belongings transported with her.

## 2021-05-29 NOTE — Progress Notes (Signed)
Attempt to call report to [facility]. No answer at this time.

## 2021-05-29 NOTE — Progress Notes (Signed)
Attempt #2 made to call report to facilty. Line was busy and stated they will call back; gave facility my call back number.

## 2021-05-29 NOTE — Progress Notes (Signed)
Report called to [facility] and given [place], [place].  Notified SW that results are back and to set up transport at this time.

## 2021-05-29 NOTE — Progress Notes (Signed)
Patient told this RN that she was willing to take medications; however after this RN opened medications patient refused and stated "I will take them tomm." Educated provided on importance of patient and she began to get agitated at this time. Medications wasted in Pyxis with Charge RN [place] as witness.

## 2021-05-29 NOTE — Progress Notes (Signed)
Daily Progress Note   Patient Name: [name]       Date: 05/29/2021 DOB: 1966/10/06  Age: 55 y.o. MRN#: [id number] Attending Physician: Domenic Polite, MD Primary Care Physician: Caprice Renshaw, MD Admit Date: 04/21/2021 Length of Stay: 38 days  Reason for Consultation/Follow-up: Establishing goals of care  HPI/Patient Profile:  55 y.o. female  with past medical history of tobacco use disorder, severe PVD, paroxysmal atrial fibrillation currently on anticoagulant, hyperlipidemia, hypertension, uncontrolled T2DM, CHF, CKD admitted on 04/21/2021 with bilateral leg pain. Noted by previous SNF that patient had decreased oral intake and not ambulating, though son states this is her baseline.  Noted recent admission to the hospital on June 7 due to sepsis secondary to infected pressure ulcers on the bilateral lower extremities for which she refused IV antibiotics and was discharged home on doxycycline.  She followed up with orthopedic surgery and vascular surgery post discharge both of whom recommended bilateral amputation as she is not a candidate for revascularization.   We have been consulted for goals of care discussion, specifically related to discordance between patient and family and wishes for amputation.  Of note, the patient has been evaluated by psychiatry and deemed to have capacity to make her healthcare decisions.  Current Medications: Scheduled Meds:  . amLODipine  10 mg Oral Daily  . aspirin EC  81 mg Oral Daily  . benztropine  0.5 mg Oral BID  . Chlorhexidine Gluconate Cloth  6 each Topical Daily  . feeding supplement  237 mL Oral TID BM  . FLUoxetine  20 mg Oral Daily  . haloperidol  5 mg Oral TID  . insulin aspart  0-5 Units Subcutaneous QHS  . insulin aspart  0-9 Units Subcutaneous TID WC  . insulin glargine-yfgn  10 Units Subcutaneous Daily  . levETIRAcetam  1,500 mg Oral Daily  . megestrol  400 mg Oral Daily  . metoprolol succinate  75 mg Oral Daily  . multivitamin  with minerals  1 tablet Oral Daily  . nicotine  7 mg Transdermal Daily  . oxybutynin  5 mg Oral Daily  . pantoprazole  40 mg Oral Daily  . rivaroxaban  20 mg Oral QPC breakfast  . rosuvastatin  10 mg Oral Daily  . senna-docusate  1 tablet Oral BID  . traZODone  50 mg Oral QHS  . umeclidinium bromide  1 puff Inhalation Daily  . zinc sulfate  220 mg Oral Daily    Continuous Infusions: . lactated ringers 10 mL/hr at 04/30/21 1255    PRN Meds: acetaminophen **OR** acetaminophen, lip balm, LORazepam, ondansetron **OR** ondansetron (ZOFRAN) IV, oxyCODONE-acetaminophen, sodium chloride flush  Subjective:   Subjective: Chart Reviewed. Updates received. Patient Assessed. Created space and opportunity for patient  and family to explore thoughts and feelings regarding current medical situation.  Today's Discussion: I met with the patient and her son [name] at the patient's bedside.  We reviewed the MOST form that was completed yesterday, specifically reviewing all decisions that were made with careful attention to "no leg amputations, no tube feedings".  The patient made clear to her son that these are her wishes and what she would like.  The son states he understands as long as he is not held responsible for her getting sick from infection due to not having amputation.  She verbalized that she would not blame him that this is her decision and she is comfortable with that.  The patient then signed the MOST form which was scanned and placed  into the electronic medical record and a physical chart.  The patient has plans for transfer today to skilled nursing facility.  Unfortunately, due to unavailability of a notary, we were unable to complete official healthcare power of attorney paperwork.  I recommended a follow-up with the SNF staff to see if they can assist in completing this paperwork.  Review of Systems  Respiratory:  Negative for cough and shortness of breath.   Cardiovascular:  Negative  for chest pain.  Gastrointestinal:  Negative for abdominal pain.   Objective:   Vital Signs: BP 121/86 (BP Location: Left Arm)   Pulse 99   Temp 98.7 F (37.1 C) (Oral)   Resp 17   Ht '5\' 1"'  (1.549 m)   Wt 74.7 kg   LMP 05/19/2016   SpO2 97%   BMI 31.12 kg/m  SpO2: SpO2: 97 % O2 Device: O2 Device: Room Air O2 Flow Rate: O2 Flow Rate (L/min): 2 L/min  Physical Exam: Physical Exam Vitals and nursing note reviewed.  Constitutional:      General: She is not in acute distress.    Appearance: She is obese. She is not toxic-appearing.  HENT:     Head: Normocephalic and atraumatic.  Pulmonary:     Effort: Pulmonary effort is normal.  Abdominal:     General: Abdomen is flat.     Palpations: Abdomen is soft.  Skin:    General: Skin is warm and dry.  Neurological:     Mental Status: She is alert.  Psychiatric:        Behavior: Behavior normal.        Thought Content: Thought content normal.    SpO2: SpO2: 97 % O2 Device:SpO2: 97 % O2 Flow Rate: .O2 Flow Rate (L/min): 2 L/min  IO: Intake/output summary: No intake or output data in the 24 hours ending 05/29/21 1652  LBM: Last BM Date: 05/24/21 Baseline Weight: Weight: 77 kg Most recent weight: Weight: 74.7 kg   Palliative Assessment/Data: 50%   Assessment & Plan:   Impression:  Pleasant 55 year old female who presents for bilateral leg pain deemed to be due to severe PVD with no options for revascularization.  Surgery and vein and vascular has recommended bilateral amputation which she continues to decline.  She seems quite adamant when I asked her about 3 times that she does not want amputation.  She also declines tube feedings.  Otherwise, she wants full scope of care including antibiotics, fluids, hospital admission, ICU if needed.  As of now the plans are to discharge to a SNF ([facility] long-term care) and a bed has been secured at [facility]. Plans for discharge/transfer today.  SUMMARY  OF RECOMMENDATIONS   MOST form completed and scanned into the record/placed on the chart Continue to treat the treatable Declines amputation at this time, declines feeding tube Full scope of care otherwise  Code Status: Full code  Symptom Management: Per primary team Palliative team available to assist with symptom management if needed  Prognosis: < 12 months  Discharge Planning:  Skilled nursing facility  Discussed with: Patient, son, nursing staff, medical team (Dr. Domenic Polite)  Thank you for allowing [name] to participate in the care of [name] PMT will continue to support holistically.  Time Total: 30 mins  Visit consisted of counseling and education dealing with the complex and emotionally intense issues of symptom management and palliative care in the setting of serious and potentially life-threatening illness. Greater than 50%  of this  time was spent counseling and coordinating care related to the above assessment and plan.  Walden Field, NP Palliative Medicine Team  Team Phone # [phone] (Nights/Weekends)  05/29/2021, 4:52 PM

## 2021-05-30 DIAGNOSIS — I739 Peripheral vascular disease, unspecified: Secondary | ICD-10-CM | POA: Diagnosis not present

## 2021-05-30 DIAGNOSIS — I96 Gangrene, not elsewhere classified: Secondary | ICD-10-CM | POA: Diagnosis not present

## 2021-05-30 DIAGNOSIS — K219 Gastro-esophageal reflux disease without esophagitis: Secondary | ICD-10-CM | POA: Diagnosis not present

## 2021-05-30 DIAGNOSIS — I10 Essential (primary) hypertension: Secondary | ICD-10-CM | POA: Diagnosis not present

## 2021-05-30 DIAGNOSIS — J9691 Respiratory failure, unspecified with hypoxia: Secondary | ICD-10-CM | POA: Diagnosis not present

## 2021-05-30 DIAGNOSIS — E1151 Type 2 diabetes mellitus with diabetic peripheral angiopathy without gangrene: Secondary | ICD-10-CM | POA: Diagnosis not present

## 2021-05-30 DIAGNOSIS — I5030 Unspecified diastolic (congestive) heart failure: Secondary | ICD-10-CM | POA: Diagnosis not present

## 2021-05-30 DIAGNOSIS — I4891 Unspecified atrial fibrillation: Secondary | ICD-10-CM | POA: Diagnosis not present

## 2021-06-03 ENCOUNTER — Ambulatory Visit: Payer: Medicare Other | Admitting: Urology

## 2021-06-03 ENCOUNTER — Ambulatory Visit (INDEPENDENT_AMBULATORY_CARE_PROVIDER_SITE_OTHER): Payer: Medicare Other | Admitting: Vascular Surgery

## 2021-06-03 ENCOUNTER — Encounter: Payer: Self-pay | Admitting: Vascular Surgery

## 2021-06-03 ENCOUNTER — Encounter (HOSPITAL_COMMUNITY): Payer: Self-pay

## 2021-06-03 ENCOUNTER — Ambulatory Visit (INDEPENDENT_AMBULATORY_CARE_PROVIDER_SITE_OTHER)
Admission: RE | Admit: 2021-06-03 | Discharge: 2021-06-03 | Disposition: A | Discharge status: Home / Self Care | Payer: Medicare Other | Source: Ambulatory Visit | Attending: Vascular Surgery | Admitting: Vascular Surgery

## 2021-06-03 ENCOUNTER — Other Ambulatory Visit: Payer: Self-pay

## 2021-06-03 ENCOUNTER — Ambulatory Visit (HOSPITAL_COMMUNITY)
Admission: RE | Admit: 2021-06-03 | Discharge: 2021-06-03 | Disposition: A | Discharge status: Home / Self Care | Payer: Medicare Other | Source: Ambulatory Visit | Attending: Vascular Surgery | Admitting: Vascular Surgery

## 2021-06-03 VITALS — BP 126/68 | HR 80 | Temp 97.5°F | Resp 20

## 2021-06-03 DIAGNOSIS — I739 Peripheral vascular disease, unspecified: Secondary | ICD-10-CM | POA: Diagnosis not present

## 2021-06-03 DIAGNOSIS — I872 Venous insufficiency (chronic) (peripheral): Secondary | ICD-10-CM | POA: Diagnosis not present

## 2021-06-03 NOTE — Progress Notes (Signed)
[facility] PROGRESS NOTE  ASSESSMENT / PLAN: [name] is a 55 y.o. female status post R SFA stenting for atypical ulceration about the calves bilaterally. These initially deteriorated, and she was offered above knee amputation. She has thankfully improved since last evaluation. Based on exam and ABI I suspect these will heal with compression dressings (e.g. Unna boots). Follow up with me in 3 months.   SUBJECTIVE: Tangential and hard to follow. Sister is with her today. Wounds appear to be improving.  OBJECTIVE: BP 126/68 (BP Location: Left Arm, Patient Position: Sitting, Cuff Size: Normal)   Pulse 80   Temp (!) 97.5 F (36.4 C)   Resp 20   LMP 05/19/2016   SpO2 99%   No acute distress RRR Unlabored In a wheelchair Improving venous ulceration of bilateral calves Feet are warm and well perfused  CBC Latest Ref Rng & Units 05/24/2021 05/19/2021 05/15/2021  WBC 4.0 - 10.5 K/uL 10.9(H) 10.3 7.6  Hemoglobin 12.0 - 15.0 g/dL 8.4(L) 8.8(L) 8.2(L)  Hematocrit 36.0 - 46.0 % 27.3(L) 29.5(L) 27.1(L)  Platelets 150 - 400 K/uL 279 295 279     CMP Latest Ref Rng & Units 05/24/2021 05/19/2021 05/15/2021  Glucose 70 - 99 mg/dL 107(H) 194(H) 151(H)  BUN 6 - 20 mg/dL '12 6 6  '$ Creatinine 0.44 - 1.00 mg/dL 1.05(H) 1.05(H) 0.94  Sodium 135 - 145 mmol/L 140 140 143  Potassium 3.5 - 5.1 mmol/L 3.5 3.8 3.5  Chloride 98 - 111 mmol/L 106 107 112(H)  CO2 22 - 32 mmol/L '27 27 26  '$ Calcium 8.9 - 10.3 mg/dL 8.3(L) 8.7(L) 8.4(L)  Total Protein 6.5 - 8.1 g/dL - - -  Total Bilirubin 0.3 - 1.2 mg/dL - - -  Alkaline Phos 38 - 126 U/L - - -  AST 15 - 41 U/L - - -  ALT 0 - 44 U/L - - -    Dallen Bunte N. Stanford Breed, MD [facility] Phone Number: [phone] 06/03/2021 4:55 PM

## 2021-06-04 ENCOUNTER — Other Ambulatory Visit: Payer: Self-pay

## 2021-06-04 DIAGNOSIS — I739 Peripheral vascular disease, unspecified: Secondary | ICD-10-CM

## 2021-06-04 NOTE — Progress Notes (Signed)
error 

## 2021-06-05 DIAGNOSIS — J9691 Respiratory failure, unspecified with hypoxia: Secondary | ICD-10-CM | POA: Diagnosis not present

## 2021-06-05 DIAGNOSIS — E1151 Type 2 diabetes mellitus with diabetic peripheral angiopathy without gangrene: Secondary | ICD-10-CM | POA: Diagnosis not present

## 2021-06-05 DIAGNOSIS — I96 Gangrene, not elsewhere classified: Secondary | ICD-10-CM | POA: Diagnosis not present

## 2021-06-05 DIAGNOSIS — K269 Duodenal ulcer, unspecified as acute or chronic, without hemorrhage or perforation: Secondary | ICD-10-CM | POA: Diagnosis not present

## 2021-06-05 DIAGNOSIS — L98429 Non-pressure chronic ulcer of back with unspecified severity: Secondary | ICD-10-CM | POA: Diagnosis not present

## 2021-06-05 DIAGNOSIS — I5030 Unspecified diastolic (congestive) heart failure: Secondary | ICD-10-CM | POA: Diagnosis not present

## 2021-06-05 DIAGNOSIS — I70209 Unspecified atherosclerosis of native arteries of extremities, unspecified extremity: Secondary | ICD-10-CM | POA: Diagnosis not present

## 2021-06-05 DIAGNOSIS — I10 Essential (primary) hypertension: Secondary | ICD-10-CM | POA: Diagnosis not present

## 2021-06-05 DIAGNOSIS — I739 Peripheral vascular disease, unspecified: Secondary | ICD-10-CM | POA: Diagnosis not present

## 2021-06-06 DIAGNOSIS — K219 Gastro-esophageal reflux disease without esophagitis: Secondary | ICD-10-CM | POA: Diagnosis not present

## 2021-06-06 DIAGNOSIS — D638 Anemia in other chronic diseases classified elsewhere: Secondary | ICD-10-CM | POA: Diagnosis not present

## 2021-06-06 DIAGNOSIS — I4891 Unspecified atrial fibrillation: Secondary | ICD-10-CM | POA: Diagnosis not present

## 2021-06-06 DIAGNOSIS — I96 Gangrene, not elsewhere classified: Secondary | ICD-10-CM | POA: Diagnosis not present

## 2021-06-06 DIAGNOSIS — J9691 Respiratory failure, unspecified with hypoxia: Secondary | ICD-10-CM | POA: Diagnosis not present

## 2021-06-06 DIAGNOSIS — I10 Essential (primary) hypertension: Secondary | ICD-10-CM | POA: Diagnosis not present

## 2021-06-06 DIAGNOSIS — I5030 Unspecified diastolic (congestive) heart failure: Secondary | ICD-10-CM | POA: Diagnosis not present

## 2021-06-06 DIAGNOSIS — E1151 Type 2 diabetes mellitus with diabetic peripheral angiopathy without gangrene: Secondary | ICD-10-CM | POA: Diagnosis not present

## 2021-06-06 DIAGNOSIS — I739 Peripheral vascular disease, unspecified: Secondary | ICD-10-CM | POA: Diagnosis not present

## 2021-06-08 ENCOUNTER — Emergency Department (HOSPITAL_COMMUNITY): Payer: Medicare Other

## 2021-06-08 ENCOUNTER — Emergency Department (HOSPITAL_COMMUNITY)
Admission: EM | Admit: 2021-06-08 | Discharge: 2021-06-08 | Disposition: A | Discharge status: Skilled Nursing Facility | Payer: Medicare Other | Attending: Emergency Medicine | Admitting: Emergency Medicine

## 2021-06-08 ENCOUNTER — Other Ambulatory Visit: Payer: Self-pay

## 2021-06-08 DIAGNOSIS — S0990XA Unspecified injury of head, initial encounter: Secondary | ICD-10-CM | POA: Diagnosis not present

## 2021-06-08 DIAGNOSIS — E1122 Type 2 diabetes mellitus with diabetic chronic kidney disease: Secondary | ICD-10-CM | POA: Insufficient documentation

## 2021-06-08 DIAGNOSIS — M5459 Other low back pain: Secondary | ICD-10-CM | POA: Diagnosis not present

## 2021-06-08 DIAGNOSIS — Z8616 Personal history of COVID-19: Secondary | ICD-10-CM | POA: Insufficient documentation

## 2021-06-08 DIAGNOSIS — R569 Unspecified convulsions: Secondary | ICD-10-CM | POA: Insufficient documentation

## 2021-06-08 DIAGNOSIS — W19XXXA Unspecified fall, initial encounter: Secondary | ICD-10-CM | POA: Diagnosis not present

## 2021-06-08 DIAGNOSIS — Y9289 Other specified places as the place of occurrence of the external cause: Secondary | ICD-10-CM | POA: Diagnosis not present

## 2021-06-08 DIAGNOSIS — Z79899 Other long term (current) drug therapy: Secondary | ICD-10-CM | POA: Diagnosis not present

## 2021-06-08 DIAGNOSIS — J449 Chronic obstructive pulmonary disease, unspecified: Secondary | ICD-10-CM | POA: Diagnosis not present

## 2021-06-08 DIAGNOSIS — I130 Hypertensive heart and chronic kidney disease with heart failure and stage 1 through stage 4 chronic kidney disease, or unspecified chronic kidney disease: Secondary | ICD-10-CM | POA: Diagnosis not present

## 2021-06-08 DIAGNOSIS — Z743 Need for continuous supervision: Secondary | ICD-10-CM | POA: Diagnosis not present

## 2021-06-08 DIAGNOSIS — M79641 Pain in right hand: Secondary | ICD-10-CM | POA: Insufficient documentation

## 2021-06-08 DIAGNOSIS — I639 Cerebral infarction, unspecified: Secondary | ICD-10-CM | POA: Diagnosis not present

## 2021-06-08 DIAGNOSIS — Z794 Long term (current) use of insulin: Secondary | ICD-10-CM | POA: Diagnosis not present

## 2021-06-08 DIAGNOSIS — I5042 Chronic combined systolic (congestive) and diastolic (congestive) heart failure: Secondary | ICD-10-CM | POA: Diagnosis not present

## 2021-06-08 DIAGNOSIS — F17210 Nicotine dependence, cigarettes, uncomplicated: Secondary | ICD-10-CM | POA: Insufficient documentation

## 2021-06-08 DIAGNOSIS — M79642 Pain in left hand: Secondary | ICD-10-CM | POA: Diagnosis not present

## 2021-06-08 DIAGNOSIS — Z7982 Long term (current) use of aspirin: Secondary | ICD-10-CM | POA: Insufficient documentation

## 2021-06-08 DIAGNOSIS — N183 Chronic kidney disease, stage 3 unspecified: Secondary | ICD-10-CM | POA: Insufficient documentation

## 2021-06-08 DIAGNOSIS — I499 Cardiac arrhythmia, unspecified: Secondary | ICD-10-CM | POA: Diagnosis not present

## 2021-06-08 DIAGNOSIS — Z7901 Long term (current) use of anticoagulants: Secondary | ICD-10-CM | POA: Insufficient documentation

## 2021-06-08 DIAGNOSIS — W06XXXA Fall from bed, initial encounter: Secondary | ICD-10-CM | POA: Insufficient documentation

## 2021-06-08 DIAGNOSIS — I4891 Unspecified atrial fibrillation: Secondary | ICD-10-CM | POA: Insufficient documentation

## 2021-06-08 DIAGNOSIS — R0902 Hypoxemia: Secondary | ICD-10-CM | POA: Diagnosis not present

## 2021-06-08 DIAGNOSIS — S199XXA Unspecified injury of neck, initial encounter: Secondary | ICD-10-CM | POA: Diagnosis not present

## 2021-06-08 DIAGNOSIS — R404 Transient alteration of awareness: Secondary | ICD-10-CM | POA: Diagnosis not present

## 2021-06-08 DIAGNOSIS — M40202 Unspecified kyphosis, cervical region: Secondary | ICD-10-CM | POA: Diagnosis not present

## 2021-06-08 DIAGNOSIS — M2578 Osteophyte, vertebrae: Secondary | ICD-10-CM | POA: Diagnosis not present

## 2021-06-08 NOTE — Discharge Instructions (Addendum)
Call your primary care doctor or specialist as discussed in the next 2-3 days.   Return immediately back to the ER if:  Your symptoms worsen within the next 12-24 hours. You develop new symptoms such as new fevers, persistent vomiting, new pain, shortness of breath, or new weakness or numbness, or if you have any other concerns.  
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
## 2021-06-08 NOTE — ED Notes (Signed)
Pt refused blood work  

## 2021-06-08 NOTE — ED Triage Notes (Signed)
Pt to ED via EMS from [facility] c/o unwitnessed fall out of bed, this apparently occurred around 1030 this morning, staff at facility put pt back in bed, pt son found out this occurred at facility and initiated EMS response. No obvious sign of injury by EMS. No medications given by EMS. Recent bilat stents in legs for DVT, pt currently not ambulatory and receiving prn medication for pain. Orientation is at baseline according to pt son. Last VS: 128/80. P 46, 96%RA. Temp 96.7

## 2021-06-09 DIAGNOSIS — G4701 Insomnia due to medical condition: Secondary | ICD-10-CM | POA: Diagnosis not present

## 2021-06-13 DIAGNOSIS — D638 Anemia in other chronic diseases classified elsewhere: Secondary | ICD-10-CM | POA: Diagnosis not present

## 2021-06-13 DIAGNOSIS — J9691 Respiratory failure, unspecified with hypoxia: Secondary | ICD-10-CM | POA: Diagnosis not present

## 2021-06-13 DIAGNOSIS — R296 Repeated falls: Secondary | ICD-10-CM | POA: Diagnosis not present

## 2021-06-13 DIAGNOSIS — I10 Essential (primary) hypertension: Secondary | ICD-10-CM | POA: Diagnosis not present

## 2021-06-13 DIAGNOSIS — I5030 Unspecified diastolic (congestive) heart failure: Secondary | ICD-10-CM | POA: Diagnosis not present

## 2021-06-13 DIAGNOSIS — I739 Peripheral vascular disease, unspecified: Secondary | ICD-10-CM | POA: Diagnosis not present

## 2021-06-13 DIAGNOSIS — I96 Gangrene, not elsewhere classified: Secondary | ICD-10-CM | POA: Diagnosis not present

## 2021-06-13 DIAGNOSIS — I4891 Unspecified atrial fibrillation: Secondary | ICD-10-CM | POA: Diagnosis not present

## 2021-06-13 DIAGNOSIS — K219 Gastro-esophageal reflux disease without esophagitis: Secondary | ICD-10-CM | POA: Diagnosis not present

## 2021-06-13 DIAGNOSIS — E1151 Type 2 diabetes mellitus with diabetic peripheral angiopathy without gangrene: Secondary | ICD-10-CM | POA: Diagnosis not present

## 2021-06-17 ENCOUNTER — Emergency Department (HOSPITAL_COMMUNITY): Payer: Medicare Other

## 2021-06-17 ENCOUNTER — Emergency Department (HOSPITAL_COMMUNITY)
Admission: EM | Admit: 2021-06-17 | Discharge: 2021-06-18 | Disposition: A | Discharge status: Home / Self Care | Payer: Medicare Other | Attending: Emergency Medicine | Admitting: Emergency Medicine

## 2021-06-17 ENCOUNTER — Encounter (HOSPITAL_COMMUNITY): Payer: Self-pay

## 2021-06-17 ENCOUNTER — Other Ambulatory Visit: Payer: Self-pay

## 2021-06-17 DIAGNOSIS — W19XXXA Unspecified fall, initial encounter: Secondary | ICD-10-CM | POA: Diagnosis not present

## 2021-06-17 DIAGNOSIS — W01198A Fall on same level from slipping, tripping and stumbling with subsequent striking against other object, initial encounter: Secondary | ICD-10-CM | POA: Diagnosis not present

## 2021-06-17 DIAGNOSIS — R000 Tachycardia, unspecified: Secondary | ICD-10-CM | POA: Diagnosis not present

## 2021-06-17 DIAGNOSIS — E1122 Type 2 diabetes mellitus with diabetic chronic kidney disease: Secondary | ICD-10-CM | POA: Diagnosis not present

## 2021-06-17 DIAGNOSIS — M25521 Pain in right elbow: Secondary | ICD-10-CM | POA: Insufficient documentation

## 2021-06-17 DIAGNOSIS — R410 Disorientation, unspecified: Secondary | ICD-10-CM | POA: Insufficient documentation

## 2021-06-17 DIAGNOSIS — Z043 Encounter for examination and observation following other accident: Secondary | ICD-10-CM | POA: Diagnosis not present

## 2021-06-17 DIAGNOSIS — M6281 Muscle weakness (generalized): Secondary | ICD-10-CM | POA: Diagnosis not present

## 2021-06-17 DIAGNOSIS — Z7984 Long term (current) use of oral hypoglycemic drugs: Secondary | ICD-10-CM | POA: Diagnosis not present

## 2021-06-17 DIAGNOSIS — Z7901 Long term (current) use of anticoagulants: Secondary | ICD-10-CM | POA: Insufficient documentation

## 2021-06-17 DIAGNOSIS — Z794 Long term (current) use of insulin: Secondary | ICD-10-CM | POA: Insufficient documentation

## 2021-06-17 DIAGNOSIS — S0990XA Unspecified injury of head, initial encounter: Secondary | ICD-10-CM | POA: Diagnosis not present

## 2021-06-17 DIAGNOSIS — Z8616 Personal history of COVID-19: Secondary | ICD-10-CM | POA: Diagnosis not present

## 2021-06-17 DIAGNOSIS — I5042 Chronic combined systolic (congestive) and diastolic (congestive) heart failure: Secondary | ICD-10-CM | POA: Insufficient documentation

## 2021-06-17 DIAGNOSIS — R519 Headache, unspecified: Secondary | ICD-10-CM | POA: Insufficient documentation

## 2021-06-17 DIAGNOSIS — N183 Chronic kidney disease, stage 3 unspecified: Secondary | ICD-10-CM | POA: Insufficient documentation

## 2021-06-17 DIAGNOSIS — Y900 Blood alcohol level of less than 20 mg/100 ml: Secondary | ICD-10-CM | POA: Insufficient documentation

## 2021-06-17 DIAGNOSIS — J449 Chronic obstructive pulmonary disease, unspecified: Secondary | ICD-10-CM | POA: Insufficient documentation

## 2021-06-17 DIAGNOSIS — R404 Transient alteration of awareness: Secondary | ICD-10-CM | POA: Diagnosis not present

## 2021-06-17 DIAGNOSIS — Z79899 Other long term (current) drug therapy: Secondary | ICD-10-CM | POA: Insufficient documentation

## 2021-06-17 DIAGNOSIS — I480 Paroxysmal atrial fibrillation: Secondary | ICD-10-CM | POA: Insufficient documentation

## 2021-06-17 DIAGNOSIS — J441 Chronic obstructive pulmonary disease with (acute) exacerbation: Secondary | ICD-10-CM | POA: Diagnosis not present

## 2021-06-17 DIAGNOSIS — F17210 Nicotine dependence, cigarettes, uncomplicated: Secondary | ICD-10-CM | POA: Insufficient documentation

## 2021-06-17 DIAGNOSIS — R269 Unspecified abnormalities of gait and mobility: Secondary | ICD-10-CM | POA: Diagnosis not present

## 2021-06-17 DIAGNOSIS — Z7982 Long term (current) use of aspirin: Secondary | ICD-10-CM | POA: Insufficient documentation

## 2021-06-17 DIAGNOSIS — I130 Hypertensive heart and chronic kidney disease with heart failure and stage 1 through stage 4 chronic kidney disease, or unspecified chronic kidney disease: Secondary | ICD-10-CM | POA: Diagnosis not present

## 2021-06-17 DIAGNOSIS — R296 Repeated falls: Secondary | ICD-10-CM | POA: Diagnosis not present

## 2021-06-17 DIAGNOSIS — I639 Cerebral infarction, unspecified: Secondary | ICD-10-CM | POA: Diagnosis not present

## 2021-06-17 DIAGNOSIS — M47812 Spondylosis without myelopathy or radiculopathy, cervical region: Secondary | ICD-10-CM | POA: Diagnosis not present

## 2021-06-17 DIAGNOSIS — Z743 Need for continuous supervision: Secondary | ICD-10-CM | POA: Diagnosis not present

## 2021-06-17 LAB — COMPREHENSIVE METABOLIC PANEL
ALT: 8 U/L (ref 0–44)
AST: 12 U/L — ABNORMAL LOW (ref 15–41)
Albumin: 2.3 g/dL — ABNORMAL LOW (ref 3.5–5.0)
Alkaline Phosphatase: 88 U/L (ref 38–126)
Anion gap: 11 (ref 5–15)
BUN: 9 mg/dL (ref 6–20)
CO2: 21 mmol/L — ABNORMAL LOW (ref 22–32)
Calcium: 8.9 mg/dL (ref 8.9–10.3)
Chloride: 110 mmol/L (ref 98–111)
Creatinine, Ser: 1.05 mg/dL — ABNORMAL HIGH (ref 0.44–1.00)
GFR, Estimated: >60 mL/min (ref >60)
Glucose, Bld: 62 mg/dL — ABNORMAL LOW (ref 70–99)
Potassium: 2.8 mmol/L — ABNORMAL LOW (ref 3.5–5.1)
Sodium: 142 mmol/L (ref 135–145)
Total Bilirubin: 0.7 mg/dL (ref 0.3–1.2)
Total Protein: 6.7 g/dL (ref 6.5–8.1)

## 2021-06-17 LAB — CBC WITH DIFFERENTIAL/PLATELET
Abs Immature Granulocytes: 0.05 10*3/uL (ref 0.00–0.07)
Basophils Absolute: 0.1 10*3/uL (ref 0.0–0.1)
Basophils Relative: 1 %
Eosinophils Absolute: 0.3 10*3/uL (ref 0.0–0.5)
Eosinophils Relative: 3 %
HCT: 35.4 % — ABNORMAL LOW (ref 36.0–46.0)
Hemoglobin: 10.6 g/dL — ABNORMAL LOW (ref 12.0–15.0)
Immature Granulocytes: 0 %
Lymphocytes Relative: 21 %
Lymphs Abs: 2.3 10*3/uL (ref 0.7–4.0)
MCH: 28.2 pg (ref 26.0–34.0)
MCHC: 29.9 g/dL — ABNORMAL LOW (ref 30.0–36.0)
MCV: 94.1 fL (ref 80.0–100.0)
Monocytes Absolute: 0.6 10*3/uL (ref 0.1–1.0)
Monocytes Relative: 5 %
Neutro Abs: 7.8 10*3/uL — ABNORMAL HIGH (ref 1.7–7.7)
Neutrophils Relative %: 70 %
Platelets: 456 10*3/uL — ABNORMAL HIGH (ref 150–400)
RBC: 3.76 MIL/uL — ABNORMAL LOW (ref 3.87–5.11)
RDW: 15.6 % — ABNORMAL HIGH (ref 11.5–15.5)
WBC: 11.2 10*3/uL — ABNORMAL HIGH (ref 4.0–10.5)
nRBC: 0 % (ref 0.0–0.2)

## 2021-06-17 LAB — ETHANOL: Alcohol, Ethyl (B): <10 mg/dL (ref <10)

## 2021-06-17 NOTE — ED Notes (Signed)
Pt transported to CT via stretcher at this time.  
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
## 2021-06-17 NOTE — ED Triage Notes (Signed)
Pt BIB [facility] from [facility] C/O unwitnessed fall. Per EMS, family found pt on floor at 1600, staff unable to account for last time pt was in bed. Pt does take Xarelto. Unknown LOC or if pt hit head. Pt C/O R elbow pain and bed sore. Per EMS, pt family reports that at baseline pt is only oriented to self.

## 2021-06-17 NOTE — Discharge Instructions (Addendum)
As discussed, today's evaluation following your fall has been generally reassuring.  If you are interested in returning to your home with additional home health services, please discuss this with your primary care physician.  Specifically, discuss initiation of physical therapy services at your house, as well as availability of home health aide for assistance.  In the interim, please return to your nursing facility, and continue to receive physical therapy there.  Do not hesitate to return here for new or concerning changes in your condition.

## 2021-06-17 NOTE — ED Notes (Signed)
[facility] called  

## 2021-06-18 DIAGNOSIS — R5381 Other malaise: Secondary | ICD-10-CM | POA: Diagnosis not present

## 2021-06-18 DIAGNOSIS — I739 Peripheral vascular disease, unspecified: Secondary | ICD-10-CM | POA: Diagnosis not present

## 2021-06-18 DIAGNOSIS — I96 Gangrene, not elsewhere classified: Secondary | ICD-10-CM | POA: Diagnosis not present

## 2021-06-20 DIAGNOSIS — E1151 Type 2 diabetes mellitus with diabetic peripheral angiopathy without gangrene: Secondary | ICD-10-CM | POA: Diagnosis not present

## 2021-06-20 DIAGNOSIS — K269 Duodenal ulcer, unspecified as acute or chronic, without hemorrhage or perforation: Secondary | ICD-10-CM | POA: Diagnosis not present

## 2021-06-20 DIAGNOSIS — I10 Essential (primary) hypertension: Secondary | ICD-10-CM | POA: Diagnosis not present

## 2021-06-20 DIAGNOSIS — I4891 Unspecified atrial fibrillation: Secondary | ICD-10-CM | POA: Diagnosis not present

## 2021-06-20 DIAGNOSIS — K219 Gastro-esophageal reflux disease without esophagitis: Secondary | ICD-10-CM | POA: Diagnosis not present

## 2021-06-20 DIAGNOSIS — I5030 Unspecified diastolic (congestive) heart failure: Secondary | ICD-10-CM | POA: Diagnosis not present

## 2021-06-20 DIAGNOSIS — I96 Gangrene, not elsewhere classified: Secondary | ICD-10-CM | POA: Diagnosis not present

## 2021-06-20 DIAGNOSIS — I739 Peripheral vascular disease, unspecified: Secondary | ICD-10-CM | POA: Diagnosis not present

## 2021-06-20 DIAGNOSIS — L98429 Non-pressure chronic ulcer of back with unspecified severity: Secondary | ICD-10-CM | POA: Diagnosis not present

## 2021-06-25 DIAGNOSIS — I739 Peripheral vascular disease, unspecified: Secondary | ICD-10-CM | POA: Diagnosis not present

## 2021-06-25 DIAGNOSIS — I96 Gangrene, not elsewhere classified: Secondary | ICD-10-CM | POA: Diagnosis not present

## 2021-06-27 DIAGNOSIS — E1151 Type 2 diabetes mellitus with diabetic peripheral angiopathy without gangrene: Secondary | ICD-10-CM | POA: Diagnosis not present

## 2021-06-27 DIAGNOSIS — I4891 Unspecified atrial fibrillation: Secondary | ICD-10-CM | POA: Diagnosis not present

## 2021-06-27 DIAGNOSIS — I5030 Unspecified diastolic (congestive) heart failure: Secondary | ICD-10-CM | POA: Diagnosis not present

## 2021-06-27 DIAGNOSIS — I70209 Unspecified atherosclerosis of native arteries of extremities, unspecified extremity: Secondary | ICD-10-CM | POA: Diagnosis not present

## 2021-06-27 DIAGNOSIS — J9691 Respiratory failure, unspecified with hypoxia: Secondary | ICD-10-CM | POA: Diagnosis not present

## 2021-06-27 DIAGNOSIS — D638 Anemia in other chronic diseases classified elsewhere: Secondary | ICD-10-CM | POA: Diagnosis not present

## 2021-07-01 ENCOUNTER — Emergency Department (HOSPITAL_COMMUNITY): Payer: Medicare Other

## 2021-07-01 ENCOUNTER — Encounter (HOSPITAL_COMMUNITY): Payer: Self-pay

## 2021-07-01 ENCOUNTER — Other Ambulatory Visit: Payer: Self-pay

## 2021-07-01 ENCOUNTER — Inpatient Hospital Stay (HOSPITAL_COMMUNITY)
Admission: EM | Admit: 2021-07-01 | Discharge: 2021-07-04 | DRG: 177 | Disposition: A | Discharge status: Skilled Nursing Facility | Payer: Medicare Other | Attending: Internal Medicine | Admitting: Internal Medicine

## 2021-07-01 DIAGNOSIS — I480 Paroxysmal atrial fibrillation: Secondary | ICD-10-CM

## 2021-07-01 DIAGNOSIS — N3001 Acute cystitis with hematuria: Secondary | ICD-10-CM | POA: Diagnosis not present

## 2021-07-01 DIAGNOSIS — U071 COVID-19: Principal | ICD-10-CM | POA: Diagnosis present

## 2021-07-01 DIAGNOSIS — G9341 Metabolic encephalopathy: Secondary | ICD-10-CM | POA: Diagnosis present

## 2021-07-01 DIAGNOSIS — I10 Essential (primary) hypertension: Secondary | ICD-10-CM | POA: Diagnosis not present

## 2021-07-01 DIAGNOSIS — F32A Depression, unspecified: Secondary | ICD-10-CM | POA: Diagnosis present

## 2021-07-01 DIAGNOSIS — E11610 Type 2 diabetes mellitus with diabetic neuropathic arthropathy: Secondary | ICD-10-CM | POA: Diagnosis present

## 2021-07-01 DIAGNOSIS — I5042 Chronic combined systolic (congestive) and diastolic (congestive) heart failure: Secondary | ICD-10-CM | POA: Diagnosis not present

## 2021-07-01 DIAGNOSIS — I130 Hypertensive heart and chronic kidney disease with heart failure and stage 1 through stage 4 chronic kidney disease, or unspecified chronic kidney disease: Secondary | ICD-10-CM | POA: Diagnosis not present

## 2021-07-01 DIAGNOSIS — Z743 Need for continuous supervision: Secondary | ICD-10-CM | POA: Diagnosis not present

## 2021-07-01 DIAGNOSIS — F17210 Nicotine dependence, cigarettes, uncomplicated: Secondary | ICD-10-CM | POA: Diagnosis present

## 2021-07-01 DIAGNOSIS — Z833 Family history of diabetes mellitus: Secondary | ICD-10-CM

## 2021-07-01 DIAGNOSIS — E119 Type 2 diabetes mellitus without complications: Secondary | ICD-10-CM

## 2021-07-01 DIAGNOSIS — I429 Cardiomyopathy, unspecified: Secondary | ICD-10-CM | POA: Diagnosis not present

## 2021-07-01 DIAGNOSIS — N179 Acute kidney failure, unspecified: Secondary | ICD-10-CM | POA: Diagnosis not present

## 2021-07-01 DIAGNOSIS — Z8249 Family history of ischemic heart disease and other diseases of the circulatory system: Secondary | ICD-10-CM

## 2021-07-01 DIAGNOSIS — E1142 Type 2 diabetes mellitus with diabetic polyneuropathy: Secondary | ICD-10-CM | POA: Diagnosis not present

## 2021-07-01 DIAGNOSIS — Z794 Long term (current) use of insulin: Secondary | ICD-10-CM | POA: Diagnosis not present

## 2021-07-01 DIAGNOSIS — Z79899 Other long term (current) drug therapy: Secondary | ICD-10-CM

## 2021-07-01 DIAGNOSIS — L89153 Pressure ulcer of sacral region, stage 3: Secondary | ICD-10-CM | POA: Diagnosis present

## 2021-07-01 DIAGNOSIS — E1122 Type 2 diabetes mellitus with diabetic chronic kidney disease: Secondary | ICD-10-CM | POA: Diagnosis not present

## 2021-07-01 DIAGNOSIS — E860 Dehydration: Secondary | ICD-10-CM | POA: Diagnosis present

## 2021-07-01 DIAGNOSIS — R410 Disorientation, unspecified: Secondary | ICD-10-CM

## 2021-07-01 DIAGNOSIS — E1165 Type 2 diabetes mellitus with hyperglycemia: Secondary | ICD-10-CM | POA: Diagnosis not present

## 2021-07-01 DIAGNOSIS — G928 Other toxic encephalopathy: Secondary | ICD-10-CM | POA: Diagnosis present

## 2021-07-01 DIAGNOSIS — Z9189 Other specified personal risk factors, not elsewhere classified: Secondary | ICD-10-CM | POA: Diagnosis not present

## 2021-07-01 DIAGNOSIS — E785 Hyperlipidemia, unspecified: Secondary | ICD-10-CM | POA: Diagnosis not present

## 2021-07-01 DIAGNOSIS — Z809 Family history of malignant neoplasm, unspecified: Secondary | ICD-10-CM

## 2021-07-01 DIAGNOSIS — I517 Cardiomegaly: Secondary | ICD-10-CM | POA: Diagnosis not present

## 2021-07-01 DIAGNOSIS — Z8673 Personal history of transient ischemic attack (TIA), and cerebral infarction without residual deficits: Secondary | ICD-10-CM

## 2021-07-01 DIAGNOSIS — F410 Panic disorder [episodic paroxysmal anxiety] without agoraphobia: Secondary | ICD-10-CM | POA: Diagnosis present

## 2021-07-01 DIAGNOSIS — N1831 Chronic kidney disease, stage 3a: Secondary | ICD-10-CM | POA: Diagnosis present

## 2021-07-01 DIAGNOSIS — E876 Hypokalemia: Secondary | ICD-10-CM | POA: Diagnosis present

## 2021-07-01 DIAGNOSIS — I700 Atherosclerosis of aorta: Secondary | ICD-10-CM | POA: Diagnosis present

## 2021-07-01 DIAGNOSIS — R404 Transient alteration of awareness: Secondary | ICD-10-CM | POA: Diagnosis not present

## 2021-07-01 DIAGNOSIS — R6889 Other general symptoms and signs: Secondary | ICD-10-CM | POA: Diagnosis not present

## 2021-07-01 DIAGNOSIS — Z8669 Personal history of other diseases of the nervous system and sense organs: Secondary | ICD-10-CM | POA: Diagnosis not present

## 2021-07-01 DIAGNOSIS — G40909 Epilepsy, unspecified, not intractable, without status epilepticus: Secondary | ICD-10-CM | POA: Diagnosis present

## 2021-07-01 DIAGNOSIS — J449 Chronic obstructive pulmonary disease, unspecified: Secondary | ICD-10-CM | POA: Diagnosis present

## 2021-07-01 DIAGNOSIS — I499 Cardiac arrhythmia, unspecified: Secondary | ICD-10-CM | POA: Diagnosis not present

## 2021-07-01 DIAGNOSIS — R32 Unspecified urinary incontinence: Secondary | ICD-10-CM | POA: Diagnosis present

## 2021-07-01 DIAGNOSIS — N390 Urinary tract infection, site not specified: Secondary | ICD-10-CM | POA: Diagnosis present

## 2021-07-01 DIAGNOSIS — Z7982 Long term (current) use of aspirin: Secondary | ICD-10-CM

## 2021-07-01 DIAGNOSIS — F0390 Unspecified dementia without behavioral disturbance: Secondary | ICD-10-CM | POA: Diagnosis present

## 2021-07-01 DIAGNOSIS — Z9119 Patient's noncompliance with other medical treatment and regimen: Secondary | ICD-10-CM

## 2021-07-01 DIAGNOSIS — Z825 Family history of asthma and other chronic lower respiratory diseases: Secondary | ICD-10-CM

## 2021-07-01 LAB — CBC WITH DIFFERENTIAL/PLATELET
Abs Immature Granulocytes: 0.04 10*3/uL (ref 0.00–0.07)
Basophils Absolute: 0.1 10*3/uL (ref 0.0–0.1)
Basophils Relative: 1 %
Eosinophils Absolute: 0.2 10*3/uL (ref 0.0–0.5)
Eosinophils Relative: 2 %
HCT: 36.1 % (ref 36.0–46.0)
Hemoglobin: 11.2 g/dL — ABNORMAL LOW (ref 12.0–15.0)
Immature Granulocytes: 0 %
Lymphocytes Relative: 21 %
Lymphs Abs: 2 10*3/uL (ref 0.7–4.0)
MCH: 28.1 pg (ref 26.0–34.0)
MCHC: 31 g/dL (ref 30.0–36.0)
MCV: 90.7 fL (ref 80.0–100.0)
Monocytes Absolute: 0.4 10*3/uL (ref 0.1–1.0)
Monocytes Relative: 4 %
Neutro Abs: 7 10*3/uL (ref 1.7–7.7)
Neutrophils Relative %: 72 %
Platelets: 467 10*3/uL — ABNORMAL HIGH (ref 150–400)
RBC: 3.98 MIL/uL (ref 3.87–5.11)
RDW: 15 % (ref 11.5–15.5)
WBC: 9.7 10*3/uL (ref 4.0–10.5)
nRBC: 0 % (ref 0.0–0.2)

## 2021-07-01 LAB — RESP PANEL BY RT-PCR (FLU A&B, COVID) ARPGX2
Influenza A by PCR: NEGATIVE
Influenza B by PCR: NEGATIVE
SARS Coronavirus 2 by RT PCR: POSITIVE — AB

## 2021-07-01 LAB — URINALYSIS, ROUTINE W REFLEX MICROSCOPIC
Glucose, UA: NEGATIVE mg/dL
Ketones, ur: 20 mg/dL — AB
Nitrite: POSITIVE — AB
Protein, ur: >=300 mg/dL — AB
RBC / HPF: >50 RBC/hpf — ABNORMAL HIGH (ref 0–5)
Specific Gravity, Urine: 1.02 (ref 1.005–1.030)
WBC, UA: >50 WBC/hpf — ABNORMAL HIGH (ref 0–5)
pH: 6 (ref 5.0–8.0)

## 2021-07-01 LAB — GLUCOSE, CAPILLARY: Glucose-Capillary: 96 mg/dL (ref 70–99)

## 2021-07-01 LAB — COMPREHENSIVE METABOLIC PANEL
ALT: 9 U/L (ref 0–44)
AST: 10 U/L — ABNORMAL LOW (ref 15–41)
Albumin: 2.2 g/dL — ABNORMAL LOW (ref 3.5–5.0)
Alkaline Phosphatase: 98 U/L (ref 38–126)
Anion gap: 12 (ref 5–15)
BUN: 17 mg/dL (ref 6–20)
CO2: 21 mmol/L — ABNORMAL LOW (ref 22–32)
Calcium: 8.6 mg/dL — ABNORMAL LOW (ref 8.9–10.3)
Chloride: 112 mmol/L — ABNORMAL HIGH (ref 98–111)
Creatinine, Ser: 1.26 mg/dL — ABNORMAL HIGH (ref 0.44–1.00)
GFR, Estimated: 50 mL/min — ABNORMAL LOW (ref >60)
Glucose, Bld: 190 mg/dL — ABNORMAL HIGH (ref 70–99)
Potassium: 2.8 mmol/L — ABNORMAL LOW (ref 3.5–5.1)
Sodium: 145 mmol/L (ref 135–145)
Total Bilirubin: 0.7 mg/dL (ref 0.3–1.2)
Total Protein: 7 g/dL (ref 6.5–8.1)

## 2021-07-01 LAB — BLOOD GAS, VENOUS
Acid-base deficit: 4.4 mmol/L — ABNORMAL HIGH (ref 0.0–2.0)
Bicarbonate: 20.5 mmol/L (ref 20.0–28.0)
FIO2: 21
O2 Saturation: 79.4 %
Patient temperature: 98.6
pCO2, Ven: 39.2 mmHg — ABNORMAL LOW (ref 44.0–60.0)
pH, Ven: 7.338 (ref 7.250–7.430)
pO2, Ven: 51.2 mmHg — ABNORMAL HIGH (ref 32.0–45.0)

## 2021-07-01 LAB — RAPID URINE DRUG SCREEN, HOSP PERFORMED
Amphetamines: NOT DETECTED
Barbiturates: NOT DETECTED
Benzodiazepines: NOT DETECTED
Cocaine: NOT DETECTED
Opiates: NOT DETECTED
Tetrahydrocannabinol: NOT DETECTED

## 2021-07-01 LAB — HEMOGLOBIN A1C
Hgb A1c MFr Bld: 6.1 % — ABNORMAL HIGH (ref 4.8–5.6)
Mean Plasma Glucose: 128.37 mg/dL

## 2021-07-01 LAB — ETHANOL: Alcohol, Ethyl (B): <10 mg/dL (ref <10)

## 2021-07-01 LAB — CBG MONITORING, ED: Glucose-Capillary: 131 mg/dL — ABNORMAL HIGH (ref 70–99)

## 2021-07-01 LAB — PROTIME-INR
INR: 1.8 — ABNORMAL HIGH (ref 0.8–1.2)
Prothrombin Time: 21.2 seconds — ABNORMAL HIGH (ref 11.4–15.2)

## 2021-07-01 LAB — LACTIC ACID, PLASMA: Lactic Acid, Venous: 0.9 mmol/L (ref 0.5–1.9)

## 2021-07-01 LAB — AMMONIA: Ammonia: 23 umol/L (ref 9–35)

## 2021-07-01 MED ORDER — ACETAMINOPHEN 650 MG RE SUPP: 650.0000 mg | Freq: Four times a day (QID) | RECTAL | Status: DC | PRN

## 2021-07-01 MED ORDER — SODIUM CHLORIDE 0.9 % IV SOLN: INTRAVENOUS | Status: DC

## 2021-07-01 MED ORDER — BEBTELOVIMAB 175 MG/2 ML IV (EUA)
175.0000 mg | Freq: Once | INTRAMUSCULAR | Status: AC
  Administered 2021-07-01: 175 mg via INTRAVENOUS
  Filled 2021-07-01: qty 2

## 2021-07-01 MED ORDER — LABETALOL HCL 5 MG/ML IV SOLN
10.0000 mg | INTRAVENOUS | Status: DC | PRN
  Administered 2021-07-03: 10 mg via INTRAVENOUS
  Filled 2021-07-01: qty 4

## 2021-07-01 MED ORDER — ALBUTEROL SULFATE HFA 108 (90 BASE) MCG/ACT IN AERS
2.0000 | INHALATION_SPRAY | Freq: Once | RESPIRATORY_TRACT | Status: AC | PRN
  Filled 2021-07-01: qty 6.7

## 2021-07-01 MED ORDER — POTASSIUM CHLORIDE 10 MEQ/100ML IV SOLN
10.0000 meq | INTRAVENOUS | Status: AC
  Administered 2021-07-01 (×3): 10 meq via INTRAVENOUS
  Filled 2021-07-01 (×3): qty 100

## 2021-07-01 MED ORDER — KCL-LACTATED RINGERS 20 MEQ/L IV SOLN
INTRAVENOUS | Status: DC
  Filled 2021-07-01 (×3): qty 1000

## 2021-07-01 MED ORDER — SODIUM CHLORIDE 0.9 % IV BOLUS
1000.0000 mL | Freq: Once | INTRAVENOUS | Status: AC
  Administered 2021-07-01: 1000 mL via INTRAVENOUS

## 2021-07-01 MED ORDER — EPINEPHRINE 0.3 MG/0.3ML IJ SOAJ
0.3000 mg | Freq: Once | INTRAMUSCULAR | Status: AC | PRN
  Filled 2021-07-01: qty 0.6

## 2021-07-01 MED ORDER — ONDANSETRON HCL 4 MG PO TABS: 4.0000 mg | ORAL_TABLET | Freq: Four times a day (QID) | ORAL | Status: DC | PRN

## 2021-07-01 MED ORDER — SODIUM CHLORIDE 0.9 % IV SOLN: INTRAVENOUS | Status: AC | PRN

## 2021-07-01 MED ORDER — METHYLPREDNISOLONE SODIUM SUCC 125 MG IJ SOLR: 125.0000 mg | Freq: Once | INTRAMUSCULAR | Status: AC | PRN

## 2021-07-01 MED ORDER — POTASSIUM CHLORIDE 10 MEQ/100ML IV SOLN
INTRAVENOUS | Status: AC
  Administered 2021-07-02: 10 meq
  Filled 2021-07-01: qty 100

## 2021-07-01 MED ORDER — ENOXAPARIN SODIUM 80 MG/0.8ML IJ SOSY
70.0000 mg | PREFILLED_SYRINGE | Freq: Two times a day (BID) | INTRAMUSCULAR | Status: DC
  Administered 2021-07-01 – 2021-07-03 (×4): 70 mg via SUBCUTANEOUS
  Filled 2021-07-01 (×4): qty 0.8
  Filled 2021-07-01: qty 0.7

## 2021-07-01 MED ORDER — FLUTICASONE FUROATE-VILANTEROL 100-25 MCG/INH IN AEPB
1.0000 | INHALATION_SPRAY | Freq: Every day | RESPIRATORY_TRACT | Status: DC
  Administered 2021-07-02 – 2021-07-04 (×3): 1 via RESPIRATORY_TRACT
  Filled 2021-07-01: qty 28

## 2021-07-01 MED ORDER — SODIUM CHLORIDE 0.9 % IV SOLN
1.0000 g | Freq: Once | INTRAVENOUS | Status: AC
  Administered 2021-07-01: 1 g via INTRAVENOUS
  Filled 2021-07-01: qty 10

## 2021-07-01 MED ORDER — IPRATROPIUM-ALBUTEROL 20-100 MCG/ACT IN AERS: 1.0000 | INHALATION_SPRAY | Freq: Four times a day (QID) | RESPIRATORY_TRACT | Status: DC | PRN

## 2021-07-01 MED ORDER — ENOXAPARIN SODIUM 80 MG/0.8ML IJ SOSY
70.0000 mg | PREFILLED_SYRINGE | Freq: Two times a day (BID) | INTRAMUSCULAR | Status: DC
  Filled 2021-07-01: qty 0.7

## 2021-07-01 MED ORDER — CEFTRIAXONE SODIUM 1 G IJ SOLR
1.0000 g | INTRAMUSCULAR | Status: AC
  Administered 2021-07-02 – 2021-07-03 (×2): 1 g via INTRAVENOUS
  Filled 2021-07-01: qty 10
  Filled 2021-07-01: qty 1

## 2021-07-01 MED ORDER — ONDANSETRON HCL 4 MG/2ML IJ SOLN: 4.0000 mg | Freq: Four times a day (QID) | INTRAMUSCULAR | Status: DC | PRN

## 2021-07-01 MED ORDER — APIXABAN 5 MG PO TABS: 5.0000 mg | ORAL_TABLET | Freq: Two times a day (BID) | ORAL | Status: DC

## 2021-07-01 MED ORDER — INSULIN ASPART 100 UNIT/ML IJ SOLN
0.0000 [IU] | Freq: Every day | INTRAMUSCULAR | Status: DC
  Administered 2021-07-03: 3 [IU] via SUBCUTANEOUS
  Filled 2021-07-01: qty 0.05

## 2021-07-01 MED ORDER — POLYETHYLENE GLYCOL 3350 17 G PO PACK: 17.0000 g | PACK | Freq: Every day | ORAL | Status: DC | PRN

## 2021-07-01 MED ORDER — SODIUM CHLORIDE 0.9 % IV SOLN
750.0000 mg | Freq: Two times a day (BID) | INTRAVENOUS | Status: DC
  Administered 2021-07-02 – 2021-07-03 (×4): 750 mg via INTRAVENOUS
  Filled 2021-07-01 (×6): qty 7.5

## 2021-07-01 MED ORDER — DIPHENHYDRAMINE HCL 50 MG/ML IJ SOLN: 50.0000 mg | Freq: Once | INTRAMUSCULAR | Status: AC | PRN

## 2021-07-01 MED ORDER — ACETAMINOPHEN 325 MG PO TABS
650.0000 mg | ORAL_TABLET | Freq: Four times a day (QID) | ORAL | Status: DC | PRN
  Administered 2021-07-01 – 2021-07-02 (×2): 650 mg via ORAL
  Filled 2021-07-01 (×2): qty 2

## 2021-07-01 MED ORDER — INSULIN ASPART 100 UNIT/ML IJ SOLN
0.0000 [IU] | Freq: Three times a day (TID) | INTRAMUSCULAR | Status: DC
  Administered 2021-07-01 – 2021-07-04 (×2): 1 [IU] via SUBCUTANEOUS
  Filled 2021-07-01: qty 0.09

## 2021-07-01 MED ORDER — FAMOTIDINE IN NACL 20-0.9 MG/50ML-% IV SOLN: 20.0000 mg | Freq: Once | INTRAVENOUS | Status: AC | PRN

## 2021-07-01 NOTE — ED Notes (Signed)
[facility] home nurse updated on pt status.

## 2021-07-01 NOTE — Progress Notes (Signed)
ANTICOAGULATION CONSULT NOTE - Initial Consult  Pharmacy Consult for Lovenox while Eliquis on hold Indication: atrial fibrillation  No Known Allergies  Patient Measurements: Height: '5\' 1"'$  (154.9 cm) Weight: 71 kg (156 lb 8.4 oz) IBW/kg (Calculated) : 47.8  Vital Signs: Temp: 98.4 F (36.9 C) (09/06 1238) Temp Source: Oral (09/06 1238) BP: 105/78 (09/06 1645) Pulse Rate: 68 (09/06 1645)  Labs: Recent Labs    07/01/21 1200  HGB 11.2*  HCT 36.1  PLT 467*  LABPROT 21.2*  INR 1.8*  CREATININE 1.26*    Estimated Creatinine Clearance: 44.4 mL/min (A) (by C-G formula based on SCr of 1.26 mg/dL (H)).   Medical History: Past Medical History:  Diagnosis Date   Alcohol use    Ankle fracture, lateral malleolus, closed 2013   Anxiety    Aortic atherosclerosis ([place]) 03/10/2021   Breast mass, left 2013   CHF (congestive heart failure) ([place])    a. EF 20-25% by echo in 05/2016 with cath showing normal cors b. EF 50-55% in 07/2020 c. 01/2021: EF at 55-60% with moderate LVH   Chronic anemia    CKD (chronic kidney disease), stage III (HCC)    Cocaine abuse (HCC)    COPD (chronic obstructive pulmonary disease) ([place])    Diabetes mellitus, type 2 (HCC)    Diabetic Charcot foot (HCC)    Essential hypertension    History of cardiomyopathy    History of GI bleed 2011   Hyperlipidemia    Noncompliance    Obesity    PAF (paroxysmal atrial fibrillation) (HCC)    Panic attacks    PAT (paroxysmal atrial tachycardia) (HCC)    Previously on Amiodarone   Seizures ([place]) 10/01/2020   Sleep apnea    Not on CPAP   Stroke ([place]) 2018   Tobacco abuse    Urinary incontinence     Medications:  Scheduled:   enoxaparin (LOVENOX) injection  70 mg Subcutaneous Q12H   fluticasone furoate-vilanterol  1 puff Inhalation Daily   insulin aspart  0-5 Units Subcutaneous QHS   insulin aspart  0-9 Units Subcutaneous TID WC   Infusions:   sodium chloride     [START ON 07/02/2021] cefTRIAXone (ROCEPHIN)   IV     famotidine (PEPCID) IV     lactated ringers with KCl 20 mEq/L     levETIRAcetam     potassium chloride 10 mEq (07/01/21 1742)   PRN: sodium chloride, acetaminophen **OR** acetaminophen, albuterol, diphenhydrAMINE, EPINEPHrine, famotidine (PEPCID) IV, Ipratropium-Albuterol, labetalol, methylPREDNISolone (SOLU-MEDROL) injection, ondansetron **OR** ondansetron (ZOFRAN) IV, polyethylene glycol  Assessment: 55 yo female on chronic Eliquis for afib presents with altered mental status.  Pharmacy is consulted to dose Lovenox while patient is NPO.  Goal of Therapy:  Anti-Xa level 0.6-1 units/ml 4hrs after LMWH dose given Monitor platelets by anticoagulation protocol: Yes   Plan:  Lovenox '1mg'$ /kg ('70mg'$ ) SQ q12h Monitor CBC, renal function, signs/symptoms of bleeding  Peggyann Juba, PharmD, BCPS Pharmacy: [phone] 07/01/2021,6:37 PM
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
## 2021-07-01 NOTE — H&P (Signed)
History and Physical    [name] [id number] DOB: Jul 04, 1966 DOA: 07/01/2021  PCP: Caprice Renshaw, MD Patient coming from: [facility] home  Chief Complaint: Altered mental status  HPI: [name] is a 55 y.o. female with history of combined CHF with recovered EF, COPD/chronic RF, IDDM-2, CVA, paroxysmal A. fib on Xarelto, neuropathy, spinal stenosis, seizure disorder, anxiety, depression, tobacco use, urinary incontinence and chronic bilateral lower extremity wounds brought to ED with acute encephalopathy.   Patient is encephalopathic, and not able to provide history.  She could only mumble and follow some commands.  No family member at bedside.  Call patient's son who is not a great historian.  Per patient's son, he saw patient about 2 days ago but not able to provide clear history about mental status at that time, and he hung up the phone on me.  Per chart review, patient AAOx4 at baseline.  SBP 90s when EMS arrived.  In ED, vital signs within normal.  Afebrile.  99% on RA.  VBG without significant finding.  K2.8.  Cr 1.29 (baseline 1.05).  BUN 17.  Hgb 11.2 (10.6 on 8/13).  Otherwise, CMP and CBC without significant finding.  Lactic acid 0.9.  COVID-19 PCR positive.  UA consistent with UTI with proteinuria and hematuria.  CXR, UDS, CT head without contrast and EtOH level within normal.  Urine culture obtained.  Received NS bolus 1 L, IV ceftriaxone and monoclonal antibody.  Hospitalist service called for admission.  ROS Not able to perform review of systems due to patient's mental status  PMH Past Medical History:  Diagnosis Date   Alcohol use    Ankle fracture, lateral malleolus, closed 2013   Anxiety    Aortic atherosclerosis ([place]) 03/10/2021   Breast mass, left 2013   CHF (congestive heart failure) ([place])    a. EF 20-25% by echo in 05/2016 with cath showing normal cors b. EF 50-55% in 07/2020 c. 01/2021: EF at 55-60% with moderate LVH   Chronic anemia     CKD (chronic kidney disease), stage III (HCC)    Cocaine abuse (HCC)    COPD (chronic obstructive pulmonary disease) ([place])    Diabetes mellitus, type 2 ([place])    Diabetic Charcot foot ([place])    Essential hypertension    History of cardiomyopathy    History of GI bleed 2011   Hyperlipidemia    Noncompliance    Obesity    PAF (paroxysmal atrial fibrillation) (HCC)    Panic attacks    PAT (paroxysmal atrial tachycardia) (HCC)    Previously on Amiodarone   Seizures ([place]) 10/01/2020   Sleep apnea    Not on CPAP   Stroke ([place]) 2018   Tobacco abuse    Urinary incontinence    PSH Past Surgical History:  Procedure Laterality Date   ANGIOPLASTY ILLIAC ARTERY Right 04/23/2021   Procedure: ANGIOPLASTY AND STENT SUPERFICIAL FEMORAL ARTERY;  Surgeon: Cherre Robins, MD;  Location: [facility];  Service: Vascular;  Laterality: Right;   AORTOGRAM  04/23/2021   Procedure: AORTOGRAM;  Surgeon: Cherre Robins, MD;  Location: [place] [facility];  Service: Vascular;;   AORTOGRAM Left 04/30/2021   Procedure: left lower extremity angiogram with second order cannulation;  Surgeon: Cherre Robins, MD;  Location: [facility];  Service: Vascular;  Laterality: Left;   BIOPSY  11/12/2020   Procedure: BIOPSY;  Surgeon: Harvel Quale, MD;  [facility];  Service: Gastroenterology;;   BREAST BIOPSY  CARDIAC CATHETERIZATION N/A 07/28/2016   Procedure: Left Heart Cath and Coronary Angiography;  Surgeon: Jettie Booze, MD;  Location: [facility];  Service: Cardiovascular;  Laterality: N/A;   COLONOSCOPY N/A 05/10/2019   Procedure: COLONOSCOPY;  Surgeon: Danie Binder, MD;  [facility];  Service: Endoscopy;  Laterality: N/A;  Phenergan 12.5 mg IV in pre-op   DILATION AND CURETTAGE OF UTERUS     ESOPHAGOGASTRODUODENOSCOPY (EGD) WITH PROPOFOL N/A 11/12/2020   Procedure: ESOPHAGOGASTRODUODENOSCOPY (EGD) WITH PROPOFOL;  Surgeon: Harvel Quale, MD;  [facility];   Service: Gastroenterology;  Laterality: N/A;   I & D EXTREMITY Bilateral 09/22/2017   Procedure: BILATERAL DEBRIDEMENT LEG/FOOT ULCERS, APPLY VERAFLO WOUND VAC;  Surgeon: Newt Minion, MD;  Location: [facility];  Service: Orthopedics;  Laterality: Bilateral;   I & D EXTREMITY Right 10/11/2018   Procedure: IRRIGATION AND DEBRIDEMENT RIGHT HAND;  Surgeon: [name], MD;  Location: [facility];  Service: Orthopedics;  Laterality: Right;   I & D EXTREMITY Right 10/13/2018   Procedure: REPEAT IRRIGATION AND DEBRIDEMENT RIGHT HAND;  Surgeon: [name], MD;  Location: [facility];  Service: Orthopedics;  Laterality: Right;   I & D EXTREMITY Right 11/22/2018   Procedure: IRRIGATION AND DEBRIDEMENT AND PINNING RIGHT HAND;  Surgeon: [name], MD;  Location: [facility];  Service: Orthopedics;  Laterality: Right;   IR RADIOLOGIST EVAL & MGMT  07/05/2018   LOWER EXTREMITY ANGIOGRAM Bilateral 04/23/2021   Procedure: RIGHT  AND LEFT LOWER EXTREMITY ANGIOGRAM;  Surgeon: Cherre Robins, MD;  Location: [facility] [facility];  Service: Vascular;  Laterality: Bilateral;   POLYPECTOMY  05/10/2019   Procedure: POLYPECTOMY;  Surgeon: Danie Binder, MD;  [facility];  Service: Endoscopy;;   SKIN SPLIT GRAFT Bilateral 09/28/2017   Procedure: BILATERAL SPLIT THICKNESS SKIN GRAFT LEGS/FEET AND APPLY VAC;  Surgeon: Newt Minion, MD;  Location: [facility];  Service: Orthopedics;  Laterality: Bilateral;   SKIN SPLIT GRAFT Right 11/22/2018   Procedure: SKIN GRAFT SPLIT THICKNESS;  Surgeon: [name], MD;  Location: [facility];  Service: Orthopedics;  Laterality: Right;   Fam HX Family History  Problem Relation Age of Onset   Hypertension Mother    Heart attack Mother    Hypertension Father        CABG    Hypertension Sister    Hypertension Brother    Hypertension Sister    Cancer Sister        breast    Arthritis Other    Cancer Other    Diabetes Other    Asthma Other    Hypertension Daughter    Hypertension Son      Social Hx  reports that she has been smoking cigarettes. She has a 5.00 pack-year smoking history. She has never used smokeless tobacco. She reports that she does not currently use alcohol. She reports that she does not currently use drugs after having used the following drugs: Marijuana and Cocaine. Frequency: 3.00 times per week.  Allergy No Known Allergies Home Meds Prior to Admission medications   Medication Sig Start Date End Date Taking? Authorizing Provider  acetaminophen (TYLENOL) 325 MG tablet Take 2 tablets (650 mg total) by mouth every 6 (six) hours as needed for headache, fever or mild pain. 11/04/20   Johnson, Clanford L, MD  Alogliptin Benzoate 25 MG TABS Take 1 tablet by mouth daily. 04/20/21   [provider]  amLODipine (NORVASC) 10 MG tablet Take 1  tablet (10 mg total) by mouth daily. 12/07/20   Roxan Hockey, MD  aspirin EC 81 MG EC tablet Take 1 tablet (81 mg total) by mouth daily. Swallow whole. 05/28/21   Lavina Hamman, MD  benztropine (COGENTIN) 0.5 MG tablet Take 1 tablet (0.5 mg total) by mouth 2 (two) times daily. 05/27/21   Lavina Hamman, MD  feeding supplement (ENSURE ENLIVE / ENSURE PLUS) LIQD Take 237 mLs by mouth 3 (three) times daily between meals. 05/27/21   Lavina Hamman, MD  FLUoxetine (PROZAC) 20 MG capsule Take 1 capsule (20 mg total) by mouth daily. 05/28/21   Lavina Hamman, MD  haloperidol (HALDOL) 5 MG tablet Take 1 tablet (5 mg total) by mouth 2 (two) times daily. 05/29/21   Domenic Polite, MD  HUMALOG KWIKPEN 100 UNIT/ML KwikPen Inject 1-10 Units into the skin See admin instructions. 151-200 =1u, 201-250= 2u, 251-300= 4u, 301-350= 6u, 351-400= 8u, 401-450= 10u 03/28/21   [provider]  insulin glargine-yfgn (SEMGLEE) 100 UNIT/ML injection Inject 0.08 mLs (8 Units total) into the skin daily. 05/30/21   Domenic Polite, MD  levETIRAcetam (KEPPRA) 750 MG tablet Take 1 tablet (750 mg total) by mouth 2 (two) times daily. 01/30/21   Suzzanne Cloud, NP  LORazepam (ATIVAN) 0.5 MG tablet Take 1 tablet (0.5 mg total) by mouth 3 (three) times daily as needed for anxiety. 05/27/21   Lavina Hamman, MD  metoprolol succinate (TOPROL-XL) 25 MG 24 hr tablet Take 3 tablets (75 mg total) by mouth daily. 05/28/21   Lavina Hamman, MD  nicotine (NICODERM CQ - DOSED IN MG/24 HR) 7 mg/24hr patch Place 1 patch (7 mg total) onto the skin daily. 05/28/21   Lavina Hamman, MD  omeprazole (PRILOSEC) 20 MG capsule Take 20 mg by mouth daily.    [provider]  oxybutynin (DITROPAN-XL) 5 MG 24 hr tablet Take 5 mg by mouth at bedtime.    [provider]  oxyCODONE-acetaminophen (PERCOCET/ROXICET) 5-325 MG tablet Take 1 tablet by mouth every 6 (six) hours as needed for moderate pain or severe pain. 05/27/21   Lavina Hamman, MD  rivaroxaban (XARELTO) 20 MG TABS tablet Take 1 tablet (20 mg total) by mouth daily after breakfast. 05/28/21   Lavina Hamman, MD  rosuvastatin (CRESTOR) 10 MG tablet Take 10 mg by mouth daily.    [provider]  tiotropium (SPIRIVA HANDIHALER) 18 MCG inhalation capsule Place 18 mcg into inhaler and inhale daily as needed (Shortness of breath).    [provider]  traZODone (DESYREL) 50 MG tablet Take 1 tablet (50 mg total) by mouth at bedtime. 12/07/20   Roxan Hockey, MD  TRULICITY 3 0000000 SOPN Inject 3 mg into the skin See admin instructions. Inject 0.5 mls subcutaneously one time a day every Friday 03/11/21   [provider]  zinc sulfate 220 (50 Zn) MG capsule Take 1 capsule (220 mg total) by mouth daily. 12/08/20   Roxan Hockey, MD  albuterol (PROAIR HFA) 108 (90 Base) MCG/ACT inhaler INHALE 2 PUFFS EVERY 6 HOURS AS NEEDED FOR SHORTNESS OF BREATH/WHEEZING. Patient not taking: Reported on 04/21/2021 08/05/20 04/21/21  [name], MD    Physical Exam: Vitals:   07/01/21 1551 07/01/21 1600 07/01/21 1615 07/01/21 1645  BP:  105/83 94/73 105/78  Pulse:  71 66 68  Resp:  '13  12 18  '$ Temp:      TempSrc:      SpO2:  100%  99% 99%  Weight: 71 kg     Height: '5\' 1"'$  (1.549 m)       GENERAL: Frail looking elderly female.  No apparent distress. HEENT: MMM.  Vision and hearing grossly intact.  NECK: Supple.  No apparent JVD.  No nuchal rigidity. RESP: 99% on RA.  No IWOB. Good air movement bilaterally. CVS:  RRR . Heart sounds normal.  ABD/GI/GU: Bowel sounds present. Soft. Non tender.  MSK/EXT:  Moves extremities.  Significant muscle mass and subcu fat loss. SKIN: Bilateral lower extremity wounds. NEURO: Sleepy but wakes to voice.  Only mumbles and follow some commands.  PERRL.  Patellar reflex symmetric.  No facial asymmetry.  No apparent focal neurodeficit but limited exam due to mental status.  PSYCH: Calm.  No distress or agitation.  Personally Reviewed Radiological Exams CT HEAD WO CONTRAST  Result Date: 07/01/2021 CLINICAL DATA:  Altered mental status. EXAM: CT HEAD WITHOUT CONTRAST TECHNIQUE: Contiguous axial images were obtained from the base of the skull through the vertex without intravenous contrast. COMPARISON:  CT head dated June 17, 2021. FINDINGS: Brain: No evidence of acute infarction, hemorrhage, hydrocephalus, extra-axial collection or mass lesion/mass effect. Stable atrophy and chronic microvascular ischemic changes. Old infarcts in the left caudate head, left thalamus, and left cerebellum again noted. Vascular: Atherosclerotic vascular calcification of the carotid siphons. No hyperdense vessel. Skull: Normal. Negative for fracture or focal lesion. Sinuses/Orbits: No acute finding. Other: None. IMPRESSION: 1. No acute intracranial abnormality. 2. Stable atrophy, chronic microvascular ischemic changes, and old infarcts. Electronically Signed   By: Titus Dubin M.D.   On: 07/01/2021 12:25   DG Chest Port 1 View  Result Date: 07/01/2021 CLINICAL DATA:  Altered mental status. EXAM: PORTABLE CHEST 1 VIEW COMPARISON:  Chest x-ray dated April 21, 2021.  FINDINGS: The patient is rotated to the right. Stable cardiomegaly. Normal pulmonary vascularity. No focal consolidation, pleural effusion, or pneumothorax. No acute osseous abnormality. IMPRESSION: No active disease. Electronically Signed   By: Titus Dubin M.D.   On: 07/01/2021 12:21     Personally Reviewed Labs: CBC: Recent Labs  Lab 07/01/21 1200  WBC 9.7  NEUTROABS 7.0  HGB 11.2*  HCT 36.1  MCV 90.7  PLT 0000000*   Basic Metabolic Panel: Recent Labs  Lab 07/01/21 1200  NA 145  K 2.8*  CL 112*  CO2 21*  GLUCOSE 190*  BUN 17  CREATININE 1.26*  CALCIUM 8.6*   GFR: Estimated Creatinine Clearance: 44.4 mL/min (A) (by C-G formula based on SCr of 1.26 mg/dL (H)). Liver Function Tests: Recent Labs  Lab 07/01/21 1200  AST 10*  ALT 9  ALKPHOS 98  BILITOT 0.7  PROT 7.0  ALBUMIN 2.2*   No results for input(s): LIPASE, AMYLASE in the last 168 hours. Recent Labs  Lab 07/01/21 1139  AMMONIA 23   Coagulation Profile: Recent Labs  Lab 07/01/21 1200  INR 1.8*   Cardiac Enzymes: No results for input(s): CKTOTAL, CKMB, CKMBINDEX, TROPONINI in the last 168 hours. BNP (last 3 results) No results for input(s): PROBNP in the last 8760 hours. HbA1C: No results for input(s): HGBA1C in the last 72 hours. CBG: No results for input(s): GLUCAP in the last 168 hours. Lipid Profile: No results for input(s): CHOL, HDL, LDLCALC, TRIG, CHOLHDL, LDLDIRECT in the last 72 hours. Thyroid Function Tests: No results for input(s): TSH, T4TOTAL, FREET4, T3FREE, THYROIDAB in the last 72 hours. Anemia Panel: No results for input(s): VITAMINB12, FOLATE, FERRITIN, TIBC, IRON, RETICCTPCT in the last  72 hours. Urine analysis:    Component Value Date/Time   COLORURINE BROWN (A) 07/01/2021 1324   APPEARANCEUR TURBID (A) 07/01/2021 1324   LABSPEC 1.020 07/01/2021 1324   PHURINE 6.0 07/01/2021 1324   GLUCOSEU NEGATIVE 07/01/2021 1324   HGBUR LARGE (A) 07/01/2021 1324   BILIRUBINUR MODERATE  (A) 07/01/2021 1324   KETONESUR 20 (A) 07/01/2021 1324   PROTEINUR >=300 (A) 07/01/2021 1324   UROBILINOGEN 0.2 09/11/2014 1319   NITRITE POSITIVE (A) 07/01/2021 1324   LEUKOCYTESUR LARGE (A) 07/01/2021 1324    Sepsis Labs:  Lactic acid 0.9.  Personally Reviewed EKG:  Twelve-lead EKG sinus rhythm with occasional PACs and T wave changes..  Assessment/Plan Acute toxic metabolic encephalopathy-multifactorial including polypharmacy, possible UTI, COVID-19 and dehydration.  No meningeal signs.  No focal neurodeficit to suggest CVA.  Doubt seizure.  CT head without acute finding. -Check basic encephalopathy labs and Keppra level -Treat treatable causes. -Hold sedating medications -IV fluid for hydration -Fall, aspiration and delirium precautions -N.p.o. pending bedside swallow eval/SLP eval -SLP/PT/OT  Possible lower urinary tract infection with hematuria-difficult diagnosis without history.  UA concerning for this.  However, she has no fever or leukocytosis.  No suprapubic tenderness either. -Continue IV ceftriaxone pending urine culture and blood culture  COVID-19 infection-has no respiratory symptoms.  CXR without infiltrate. -Received monoclonal antibodies in ED -Airborne and contact precautions.  Chronic combined CHF with recovered EF: Recent TTE in 04/2021 with LVEF of 60 to 65%.  Appears dry on exam.  Does not appear to be on diuretics but med rec pending. -Continue IV fluid -Monitor respiratory and fluid status  Paroxysmal A. fib-in sinus rhythm.  CHA2DS2-VASc score 6.  On Toprol-XL and Xarelto at home. -Resume home meds after med rec  Chronic COPD/chronic RF?-not clear if she is on oxygen at facility.  No O2 requirements in ED -Breo Ellipta -Respimat every 6 hours as needed  Uncontrolled IDDM-2 with neuropathy and hyperglycemia: A1c 11.1% in 02/2021. -CBG monitoring and SSI-sensitive while NPO -Check hemoglobin A1c  History of CVA: No focal neurodeficit on exam   -Continue home meds after med rec  Essential hypertension: Normotensive  Anxiety/depression-Haldol, Prozac, Ativan, trazodone on home medication list. -Resume as appropriate after med rec  History of seizure-Keppra on home medication list -Seizure precaution -Check Keppra level -Resume home Keppra either IV or p.o. after med rec  CKD-3A: Cr 1.29 (baseline 1.05).  Likely prerenal -IV LR-KCl'@100'$  cc an hour -Recheck in the morning  Urinary incontinence-seems to take oxybutynin at home. -Resume after med rec  Hypokalemia: K2.8. -IV KCl 10 mill equivalent x4 -Add KCl to IV fluids -Check magnesium  At risk for polypharmacy-multiple sedating medication on med list but med rec pending. -Review after med rec  Chronic bilateral lower extremity wound-Good DP pulses distally. -WOCN consult  Generalized weakness/debility -PT/OT eval  DVT prophylaxis: Patient is on Xarelto prior to admission.  Will resume Xarelto after med rec  Code Status: Full code by default-not able to clarify this with patient's son Family Communication: Attempted to update patient's son but he hung up on me  Disposition Plan: Admit to telemetry Consults called: None Admission status: Observation Level of care: Telemetry   Mercy Riding MD [facility]  If 7PM-7AM, please contact night-coverage [email or web address]  07/01/2021, 5:19 PM

## 2021-07-01 NOTE — ED Triage Notes (Signed)
Pt BIB EMS. Pt coming from [facility] with AMS that started this morning. Pt has had a UTI for the last two days. Urine sent off from facility but no results back yet. Per facility pt is normally AxOx4

## 2021-07-02 DIAGNOSIS — I130 Hypertensive heart and chronic kidney disease with heart failure and stage 1 through stage 4 chronic kidney disease, or unspecified chronic kidney disease: Secondary | ICD-10-CM | POA: Diagnosis not present

## 2021-07-02 DIAGNOSIS — Z743 Need for continuous supervision: Secondary | ICD-10-CM | POA: Diagnosis not present

## 2021-07-02 DIAGNOSIS — U071 COVID-19: Secondary | ICD-10-CM | POA: Diagnosis not present

## 2021-07-02 DIAGNOSIS — J449 Chronic obstructive pulmonary disease, unspecified: Secondary | ICD-10-CM | POA: Diagnosis present

## 2021-07-02 DIAGNOSIS — E785 Hyperlipidemia, unspecified: Secondary | ICD-10-CM | POA: Diagnosis present

## 2021-07-02 DIAGNOSIS — L89153 Pressure ulcer of sacral region, stage 3: Secondary | ICD-10-CM | POA: Diagnosis not present

## 2021-07-02 DIAGNOSIS — E876 Hypokalemia: Secondary | ICD-10-CM | POA: Diagnosis present

## 2021-07-02 DIAGNOSIS — I5042 Chronic combined systolic (congestive) and diastolic (congestive) heart failure: Secondary | ICD-10-CM | POA: Diagnosis not present

## 2021-07-02 DIAGNOSIS — E1165 Type 2 diabetes mellitus with hyperglycemia: Secondary | ICD-10-CM | POA: Diagnosis present

## 2021-07-02 DIAGNOSIS — E11610 Type 2 diabetes mellitus with diabetic neuropathic arthropathy: Secondary | ICD-10-CM | POA: Diagnosis not present

## 2021-07-02 DIAGNOSIS — I700 Atherosclerosis of aorta: Secondary | ICD-10-CM | POA: Diagnosis present

## 2021-07-02 DIAGNOSIS — F32A Depression, unspecified: Secondary | ICD-10-CM | POA: Diagnosis not present

## 2021-07-02 DIAGNOSIS — I480 Paroxysmal atrial fibrillation: Secondary | ICD-10-CM | POA: Diagnosis present

## 2021-07-02 DIAGNOSIS — Z7401 Bed confinement status: Secondary | ICD-10-CM | POA: Diagnosis not present

## 2021-07-02 DIAGNOSIS — R5383 Other fatigue: Secondary | ICD-10-CM | POA: Diagnosis not present

## 2021-07-02 DIAGNOSIS — E860 Dehydration: Secondary | ICD-10-CM | POA: Diagnosis present

## 2021-07-02 DIAGNOSIS — F0390 Unspecified dementia without behavioral disturbance: Secondary | ICD-10-CM | POA: Diagnosis present

## 2021-07-02 DIAGNOSIS — G40909 Epilepsy, unspecified, not intractable, without status epilepticus: Secondary | ICD-10-CM | POA: Diagnosis present

## 2021-07-02 DIAGNOSIS — E1122 Type 2 diabetes mellitus with diabetic chronic kidney disease: Secondary | ICD-10-CM | POA: Diagnosis not present

## 2021-07-02 DIAGNOSIS — E1142 Type 2 diabetes mellitus with diabetic polyneuropathy: Secondary | ICD-10-CM | POA: Diagnosis not present

## 2021-07-02 DIAGNOSIS — N1831 Chronic kidney disease, stage 3a: Secondary | ICD-10-CM | POA: Diagnosis present

## 2021-07-02 DIAGNOSIS — G9341 Metabolic encephalopathy: Secondary | ICD-10-CM | POA: Diagnosis not present

## 2021-07-02 DIAGNOSIS — N390 Urinary tract infection, site not specified: Secondary | ICD-10-CM | POA: Diagnosis present

## 2021-07-02 DIAGNOSIS — R32 Unspecified urinary incontinence: Secondary | ICD-10-CM | POA: Diagnosis present

## 2021-07-02 DIAGNOSIS — F17210 Nicotine dependence, cigarettes, uncomplicated: Secondary | ICD-10-CM | POA: Diagnosis present

## 2021-07-02 DIAGNOSIS — I429 Cardiomyopathy, unspecified: Secondary | ICD-10-CM | POA: Diagnosis not present

## 2021-07-02 DIAGNOSIS — N179 Acute kidney failure, unspecified: Secondary | ICD-10-CM | POA: Diagnosis not present

## 2021-07-02 DIAGNOSIS — G928 Other toxic encephalopathy: Secondary | ICD-10-CM | POA: Diagnosis not present

## 2021-07-02 DIAGNOSIS — R5381 Other malaise: Secondary | ICD-10-CM | POA: Diagnosis not present

## 2021-07-02 LAB — GLUCOSE, CAPILLARY
Glucose-Capillary: 112 mg/dL — ABNORMAL HIGH (ref 70–99)
Glucose-Capillary: 166 mg/dL — ABNORMAL HIGH (ref 70–99)
Glucose-Capillary: 134 mg/dL — ABNORMAL HIGH (ref 70–99)

## 2021-07-02 MED ORDER — ORAL CARE MOUTH RINSE
15.0000 mL | Freq: Two times a day (BID) | OROMUCOSAL | Status: DC
  Administered 2021-07-02 – 2021-07-04 (×5): 15 mL via OROMUCOSAL

## 2021-07-02 MED ORDER — POTASSIUM CHLORIDE 2 MEQ/ML IV SOLN
INTRAVENOUS | Status: DC
  Filled 2021-07-02 (×2): qty 1000

## 2021-07-02 MED ORDER — TRAMADOL HCL 50 MG PO TABS
25.0000 mg | ORAL_TABLET | Freq: Once | ORAL | Status: AC
Start: 2021-07-02 — End: 2021-07-02
  Administered 2021-07-02: 25 mg via ORAL
  Filled 2021-07-02: qty 1

## 2021-07-02 MED ORDER — COLLAGENASE 250 UNIT/GM EX OINT
TOPICAL_OINTMENT | Freq: Every day | CUTANEOUS | Status: DC
  Administered 2021-07-04: 1 via TOPICAL
  Filled 2021-07-02: qty 30

## 2021-07-02 MED ORDER — POTASSIUM CHLORIDE 2 MEQ/ML IV SOLN
INTRAVENOUS | Status: AC
  Filled 2021-07-02: qty 1000

## 2021-07-02 NOTE — Consult Note (Signed)
WOC Nurse Consult Note: Reason for Consult: bilateral LEs Followed by [facility], recent R SFA stenting, atypical non healing wounds of the RLE Wound type: Full thickness necrotic wounds; RLE (distal pretibial and achilles) Partial thickness wound; LLE (medial malleolar) Of note: patient has Stage 3 pressure injury on the coccyx; 100% clean/pink  Pressure Injury POA: Yes Measurement: Left inner malleolar : 2cm x 1cm: 100% pale, pink, dry Right achilles: 2cm x 3cm x 0.2cm; 100% necrotic with exposed tendon Right pretibial: 4cm x 4cm x0cm; 100% eschar Sacrum: 1.5cm x 0.3cm x 0.1cm  Wound bed: see above  Drainage (amount, consistency, odor) minimal only from the achilles; yellow Periwound: intact  Dressing procedure/placement/frequency: Add enzymatic debridement to the RLE wounds; they are necrotic and patient has had recent vascular intervention, change daily LLE protect with silicone foam, change every 3 days  Sacrum protect with silicone foam, change every 3 days  Follow up with [facility] as scheduled.   Discussed POC with patient and bedside nurse.  Re consult if needed, will not follow at this time. Thanks  Libbi Towner [employer], RN,CWOCN, CNS, [place] [phone])

## 2021-07-02 NOTE — Evaluation (Signed)
Physical Therapy Evaluation-1x Patient Details Name: [name] MRN: [id number] DOB: [birth year] Today's Date: 07/02/2021   History of Present Illness  Patient is a 55 year old female who presented to ED with altered mental status. patient was found to have acute metabolic encephalpathy, UTI and COVID 54 PMH: CVA, seizures, obesitry, tobacco abuse, GIB, HTN, diabetes, charcot foot, cocaine abuse, CKD III, combined CHF, anemia.  Clinical Impression  Bed level eval only. Contacted SNF-nursing staff was unable to provide clear info so proceeded with eval. Pt reports she is bedbound at baseline. Staff will occasionally use lift for OOB>chair. No acute PT needs. 1x eval. Will sign off.     Follow Up Recommendations No PT follow up (return to SNF)    Equipment Recommendations  None recommended by PT    Recommendations for Other Services       Precautions / Restrictions Precautions Precautions: Fall Precaution Comments: multiple wounds on legs Restrictions Weight Bearing Restrictions: No      Mobility  Bed Mobility Overal bed mobility: Needs Assistance Bed Mobility: Rolling Rolling: Max assist;Mod assist         General bed mobility comments: Assist for rolling. Mod A for roll to R side, Max A for roll to L side. Repositioned in sidelying at pt's request with pillow between knees.    Transfers                    Ambulation/Gait                Stairs            Wheelchair Mobility    Modified Rankin (Stroke Patients Only)       Balance                                             Pertinent Vitals/Pain Pain Assessment: Faces Faces Pain Scale: Hurts even more Pain Location: bilateral ankles Pain Descriptors / Indicators: Constant;Guarding;Grimacing Pain Intervention(s): Limited activity within patient's tolerance;Repositioned    Home Living Family/patient expects to be discharged to:: Skilled nursing facility                       Prior Function Level of Independence: Needs assistance   Gait / Transfers Assistance Needed: pt stated she does not get out of bed often. when she does, staff uses a lift  ADL's / Homemaking Assistance Needed: patient reported being Total assist  for bathing, dressing and toileting tasks. patient reported she was able to self feed and participate in grooming tasks.        Hand Dominance   Dominant Hand: Right    Extremity/Trunk Assessment   Upper Extremity Assessment Upper Extremity Assessment: Defer to OT evaluation    Lower Extremity Assessment Lower Extremity Assessment: Generalized weakness    Cervical / Trunk Assessment Cervical / Trunk Assessment: Normal  Communication   Communication: Expressive difficulties (difficult to understand at times)  Cognition Arousal/Alertness: Awake/alert Behavior During Therapy: WFL for tasks assessed/performed Overall Cognitive Status: No family/caregiver present to determine baseline cognitive functioning                                 General Comments: patient was noted to have some communication deficits with fast speech that sounds slurred at  times. a bit irritable.      General Comments      Exercises     Assessment/Plan    PT Assessment Patent does not need any further PT services  PT Problem List         PT Treatment Interventions      PT Goals (Current goals can be found in the Care Plan section)  Acute Rehab PT Goals Patient Stated Goal: to get someone to bring her teeth and glasses (made RN aware of pt's request) PT Goal Formulation: All assessment and education complete, DC therapy    Frequency     Barriers to discharge        Co-evaluation   Reason for Co-Treatment: To address functional/ADL transfers PT goals addressed during session: Mobility/safety with mobility OT goals addressed during session: ADL's and self-care       AM-PAC PT "6 Clicks" Mobility   Outcome Measure Help needed turning from your back to your side while in a flat bed without using bedrails?: A Lot Help needed moving from lying on your back to sitting on the side of a flat bed without using bedrails?: Total Help needed moving to and from a bed to a chair (including a wheelchair)?: Total Help needed standing up from a chair using your arms (e.g., wheelchair or bedside chair)?: Total Help needed to walk in hospital room?: Total Help needed climbing 3-5 steps with a railing? : Total 6 Click Score: 7    End of Session   Activity Tolerance: Patient tolerated treatment well Patient left: in bed;with call bell/phone within reach;with bed alarm set Nurse Communication:  (may need NT to check purewick placement-made RN aware)      Time: 1130-1149 PT Time Calculation (min) (ACUTE ONLY): 19 min   Charges:   PT Evaluation $PT Eval Moderate Complexity: [place], PT Acute Rehabilitation  Office: [phone] Pager: [phone]

## 2021-07-02 NOTE — Progress Notes (Signed)
PROGRESS NOTE    [name]  [id number] DOB: 12/13/1965 DOA: 07/01/2021 PCP: Caprice Renshaw, MD   Brief Narrative:  [name] is a 55 y.o. female with history of combined CHF with recovered EF, COPD/chronic RF, IDDM-2, CVA, paroxysmal A. fib on Xarelto, neuropathy, spinal stenosis, seizure disorder, anxiety, depression, tobacco use, urinary incontinence and chronic bilateral lower extremity wounds brought to ED with acute encephalopathy.    Patient admitted from facility due to worsening encephalopathy on baseline reported dementia.  Admitted for possible UTI versus polypharmacy etiology.   Assessment & Plan:    Acute toxic versus metabolic encephalopathy, POA, multifactorial -Continue to hold sedating medications given concern for polypharmacy -Treat UTI as below -COVID status post monoclonal antibody administration in the ED, remains without respiratory complaints or hypoxia   AKI with questionable history of CKD, POA Cannot rule out UTI, POA  -Patient does not meet sepsis criteria  -Continue ceftriaxone x3 days  -Follow repeat cultures -GFR >60 recently and appears to be baseline - does not meet criteria for CKD at this time - GFR earlier this year ranging from 30-50.  Incidental COVID-19 positive, without symptoms, POA  - CXR without infiltrate. -Received monoclonal antibodies in ED -Airborne and contact precautions ongoing, unclear timeframe -questionably acute although could be lingering positive from previous infection.   Chronic combined CHF, not in acute exacerbation:  EF 60 to 65% on most recent echo  Dehydrated at intake, continue to encourage p.o. intake, low rate IV fluids ongoing, monitor closely  -No longer on diuretics per med rec   paroxysmal A. fib-in sinus rhythm.  -CHA2DS2-VASc score 6.  Continue Xarelto and metoprolol  Chronic COPD with questionable chronic respiratory failure, without hypoxia  - Unclear if she is on oxygen at facility per  signout, not currently requiring oxygen - Continue Breo Ellipta, nebs   Uncontrolled IDDM-2 with neuropathy and hyperglycemia:  - A1c 11.1% in 02/2021. - 6.1 now - CBG monitoring and SSI-sensitive while NPO   History of CVA - Continue home meds after med rec   Essential hypertension: Normotensive   Anxiety/depression-Haldol, Prozac, Ativan, trazodone on home medication list - not updated - unclear if this is accurate -Resume as appropriate after med rec   History of seizure-Keppra on home medication list -Seizure precaution -Check Keppra level -Resume home Keppra either IV or p.o. after med rec   Hypokalemia Follow repeat labs   Chronic bilateral lower extremity wound -Good DP pulses distally. -WOCN consult   Generalized weakness/debility -PT/OT eval   DVT prophylaxis: Restart DOAC (listed eliquis/xarelto on med rec - awaiting reconciliation)           Code Status: Full code -unable to confirm, family unavailable Family Communication: Patient's son hung up on previous provider, no answer today  Status is: Inpatient  Dispo: The patient is from: Facility              Anticipated d/c is to: Same              Anticipated d/c date is: 48 to 72 hours              Patient currently not medically stable for discharge  Consultants:  None  Procedures:  None  Antimicrobials:  Ceftriaxone  Subjective: No acute issues or events overnight, patient appears to be more awake and alert this morning but continues to be oriented to person only.  Review of systems limited but denies chest pain shortness of breath headache fevers or  chills  Objective: Vitals:   07/01/21 2117 07/02/21 0500 07/02/21 0605 07/02/21 0742  BP: (!) 111/93  (!) 124/99 104/71  Pulse: 78  84 71  Resp: '15  16 14  '$ Temp: 98.2 F (36.8 C)  98.1 F (36.7 C) 98.1 F (36.7 C)  TempSrc:   Oral   SpO2: 100%  99% 98%  Weight:  65.7 kg    Height:        Intake/Output Summary (Last 24 hours) at 07/02/2021  0853 Last data filed at 07/01/2021 1903 Gross per 24 hour  Intake 157.17 ml  Output --  Net 157.17 ml   Filed Weights   07/01/21 1551 07/02/21 0500  Weight: 71 kg 65.7 kg    Examination:  General:  Pleasantly resting in bed, No acute distress.  Alert to person only HEENT:  Normocephalic atraumatic.  Sclerae nonicteric, noninjected.  Extraocular movements intact bilaterally. Neck:  Without mass or deformity.  Trachea is midline. Lungs:  Clear to auscultate bilaterally without rhonchi, wheeze, or rales. Heart:  Regular rate and rhythm.  Without murmurs, rubs, or gallops. Abdomen:  Soft, nontender, nondistended.  Without guarding or rebound. Extremities: Without cyanosis, clubbing, edema, or obvious deformity. Vascular:  Dorsalis pedis and posterior tibial pulses palpable bilaterally. Skin:  Warm and dry, no erythema, bilateral lower extremity wounds, dressing clean dry intact   Data Reviewed: I have personally reviewed following labs and imaging studies  CBC: Recent Labs  Lab 07/01/21 1200  WBC 9.7  NEUTROABS 7.0  HGB 11.2*  HCT 36.1  MCV 90.7  PLT 0000000*   Basic Metabolic Panel: Recent Labs  Lab 07/01/21 1200  NA 145  K 2.8*  CL 112*  CO2 21*  GLUCOSE 190*  BUN 17  CREATININE 1.26*  CALCIUM 8.6*   GFR: Estimated Creatinine Clearance: 42.8 mL/min (A) (by C-G formula based on SCr of 1.26 mg/dL (H)). Liver Function Tests: Recent Labs  Lab 07/01/21 1200  AST 10*  ALT 9  ALKPHOS 98  BILITOT 0.7  PROT 7.0  ALBUMIN 2.2*   No results for input(s): LIPASE, AMYLASE in the last 168 hours. Recent Labs  Lab 07/01/21 1139  AMMONIA 23   Coagulation Profile: Recent Labs  Lab 07/01/21 1200  INR 1.8*   Cardiac Enzymes: No results for input(s): CKTOTAL, CKMB, CKMBINDEX, TROPONINI in the last 168 hours. BNP (last 3 results) No results for input(s): PROBNP in the last 8760 hours. HbA1C: Recent Labs    07/01/21 1718  HGBA1C 6.1*   CBG: Recent Labs  Lab  07/01/21 1749 07/01/21 2226 07/02/21 0738  GLUCAP 131* 96 112*   Lipid Profile: No results for input(s): CHOL, HDL, LDLCALC, TRIG, CHOLHDL, LDLDIRECT in the last 72 hours. Thyroid Function Tests: No results for input(s): TSH, T4TOTAL, FREET4, T3FREE, THYROIDAB in the last 72 hours. Anemia Panel: No results for input(s): VITAMINB12, FOLATE, FERRITIN, TIBC, IRON, RETICCTPCT in the last 72 hours. Sepsis Labs: Recent Labs  Lab 07/01/21 1200  LATICACIDVEN 0.9    Recent Results (from the past 240 hour(s))  Resp Panel by RT-PCR (Flu A&B, Covid) Nasopharyngeal Swab     Status: Abnormal   Collection Time: 07/01/21 12:32 PM   Specimen: Nasopharyngeal Swab; Nasopharyngeal(NP) swabs in vial transport medium  Result Value Ref Range Status   SARS Coronavirus 2 by RT PCR POSITIVE (A) NEGATIVE Final    Comment: RESULT CALLED TO, READ BACK BY AND VERIFIED WITH: [name],[name] AT [id number] 07/01/21 [name],[name]    Influenza A by PCR NEGATIVE  NEGATIVE Final   Influenza B by PCR NEGATIVE NEGATIVE Final    Comment: Performed at [facility], [place] [address] [place], [place] [address]         Radiology Studies: CT HEAD WO CONTRAST  Result Date: 07/01/2021 CLINICAL DATA:  Altered mental status. EXAM: CT HEAD WITHOUT CONTRAST TECHNIQUE: Contiguous axial images were obtained from the base of the skull through the vertex without intravenous contrast. COMPARISON:  CT head dated June 17, 2021. FINDINGS: Brain: No evidence of acute infarction, hemorrhage, hydrocephalus, extra-axial collection or mass lesion/mass effect. Stable atrophy and chronic microvascular ischemic changes. Old infarcts in the left caudate head, left thalamus, and left cerebellum again noted. Vascular: Atherosclerotic vascular calcification of the carotid siphons. No hyperdense vessel. Skull: Normal. Negative for fracture or focal lesion. Sinuses/Orbits: No acute finding. Other: None. IMPRESSION: 1. No acute intracranial  abnormality. 2. Stable atrophy, chronic microvascular ischemic changes, and old infarcts. Electronically Signed   By: Titus Dubin M.D.   On: 07/01/2021 12:25   DG Chest Port 1 View  Result Date: 07/01/2021 CLINICAL DATA:  Altered mental status. EXAM: PORTABLE CHEST 1 VIEW COMPARISON:  Chest x-ray dated April 21, 2021. FINDINGS: The patient is rotated to the right. Stable cardiomegaly. Normal pulmonary vascularity. No focal consolidation, pleural effusion, or pneumothorax. No acute osseous abnormality. IMPRESSION: No active disease. Electronically Signed   By: Titus Dubin M.D.   On: 07/01/2021 12:21     Scheduled Meds:  enoxaparin (LOVENOX) injection  70 mg Subcutaneous Q12H   fluticasone furoate-vilanterol  1 puff Inhalation Daily   insulin aspart  0-5 Units Subcutaneous QHS   insulin aspart  0-9 Units Subcutaneous TID WC   mouth rinse  15 mL Mouth Rinse BID   Continuous Infusions:  sodium chloride     cefTRIAXone (ROCEPHIN)  IV     famotidine (PEPCID) IV     lactated ringers with kcl 100 mL/hr at 07/02/21 0815   levETIRAcetam 750 mg (07/02/21 0808)     LOS: 0 days   Time spent: 42mn  Barbara Briere C Keena Dinse, DO [facility]  If 7PM-7AM, please contact night-coverage [email or web address]  07/02/2021, 8:53 AM

## 2021-07-02 NOTE — Progress Notes (Signed)
Patient continue to refused lab draw, even after explanations, she stated stated she does not want lab draw and does not want to talk about it.

## 2021-07-02 NOTE — Progress Notes (Signed)
Patient refusing blood sugar check, insulin, meals and lab draws even after explaination Dr. Avon Gully notified and Son also made aware.

## 2021-07-02 NOTE — Plan of Care (Signed)
  Problem: Pain Managment: Goal: General experience of comfort will improve Outcome: Progressing   Problem: Safety: Goal: Ability to remain free from injury will improve Outcome: Progressing   Problem: Skin Integrity: Goal: Risk for impaired skin integrity will decrease Outcome: Progressing   Problem: Respiratory: Goal: Will maintain a patent airway Outcome: Progressing

## 2021-07-02 NOTE — Evaluation (Signed)
Occupational Therapy Evaluation Patient Details Name: [name] MRN: [id number] DOB: 03-16-1966 Today's Date: 07/02/2021    History of Present Illness Patient is a 55 year old female who presented to ED with altered mental status. patient was found to have acute metabolic encephalpathy, UTI and COVID 72 PMH: CVA, seizures, obesitry, tobacco abuse, GIB, HTN, diabetes, charcot foot, cocaine abuse, CKD III, combined CHF, anemia.   Clinical Impression   Patient is a 55 year old female who was admitted for above. Patient is a long term care resident at SNF who is TD for ADL tasks. Patient is at baseline for ADLs with max A for rolling in bed with use of bed rails. Patient indicated she was at baseline as well. Patient recommended to transition back to SNF with 24/7 care at time of d/c. Patient has no acute OT needs at this time.     Follow Up Recommendations  SNF;Supervision/Assistance - 24 hour    Equipment Recommendations  None recommended by OT    Recommendations for Other Services       Precautions / Restrictions Restrictions Weight Bearing Restrictions: No      Mobility Bed Mobility Overal bed mobility: Needs Assistance Bed Mobility: Rolling Rolling: Max assist         General bed mobility comments: patient was physical assistance for rolling to each with with increased assistance to R v.s. L side.    Transfers                      Balance                                           ADL either performed or assessed with clinical judgement   ADL Overall ADL's : At baseline                                       General ADL Comments: patient is TD for ADL tasks per patient report. patient demonstrated ability to ROM UE to complete self feeding tasks. patient reported she stays in bed at SNF. patient indicated that she is at her baseline.     Vision         Perception     Praxis      Pertinent Vitals/Pain Pain  Assessment: Faces Faces Pain Scale: Hurts even more Pain Location: bilateral ankles Pain Descriptors / Indicators: Constant;Guarding;Grimacing Pain Intervention(s): Limited activity within patient's tolerance;Repositioned     Hand Dominance Right   Extremity/Trunk Assessment Upper Extremity Assessment Upper Extremity Assessment: Generalized weakness   Lower Extremity Assessment Lower Extremity Assessment: Defer to PT evaluation       Communication Communication Communication: Expressive difficulties   Cognition Arousal/Alertness: Awake/alert Behavior During Therapy: WFL for tasks assessed/performed Overall Cognitive Status: No family/caregiver present to determine baseline cognitive functioning                                 General Comments: patient was noted to have some communication deficits with fast speech that sounds slurred at times.   General Comments       Exercises     Shoulder Instructions      Home Living Family/patient expects to be discharged to:: Skilled  nursing facility                                        Prior Functioning/Environment Level of Independence: Needs assistance  Gait / Transfers Assistance Needed: patient reported staff helping her with all transfers with lift. ADL's / Homemaking Assistance Needed: patient reported being TD for bathing, dressing and toileting tasks. patient reported she was able to self feed and participate in grooming tasks.            OT Problem List: Obesity;Decreased range of motion;Decreased strength;Decreased activity tolerance;Impaired balance (sitting and/or standing);Decreased safety awareness;Impaired UE functional use      OT Treatment/Interventions:      OT Goals(Current goals can be found in the care plan section) Acute Rehab OT Goals OT Goal Formulation: All assessment and education complete, DC therapy  OT Frequency:     Barriers to D/C:             Co-evaluation PT/OT/SLP Co-Evaluation/Treatment: Yes Reason for Co-Treatment: To address functional/ADL transfers PT goals addressed during session: Mobility/safety with mobility OT goals addressed during session: ADL's and self-care      AM-PAC OT "6 Clicks" Daily Activity     Outcome Measure Help from another person eating meals?: A Little Help from another person taking care of personal grooming?: A Little Help from another person toileting, which includes using toliet, bedpan, or urinal?: Total Help from another person bathing (including washing, rinsing, drying)?: Total Help from another person to put on and taking off regular upper body clothing?: Total Help from another person to put on and taking off regular lower body clothing?: Total 6 Click Score: 10   End of Session Nurse Communication: Mobility status  Activity Tolerance: Patient tolerated treatment well Patient left: in bed;with call bell/phone within reach  OT Visit Diagnosis: Pain Pain - Right/Left: Right Pain - part of body: Ankle and joints of foot                Time: 1130-1149 OT Time Calculation (min): 19 min Charges:  OT General Charges $OT Visit: 1 Visit OT Evaluation $OT Eval Low Complexity: 1 Low  Leota Sauers, MS Acute Rehabilitation Department Office# [phone] Pager# [phone]   [place] 07/02/2021, 12:08 PM

## 2021-07-02 NOTE — Progress Notes (Signed)
Patient was admitted to the floor around 2115. Lab came to patient's room to draw labs. Patient refused. Notified on call provider about patient's refusal.

## 2021-07-02 NOTE — Progress Notes (Addendum)
PT Cancellation Note  Patient Details Name: [name] MRN: [id number] DOB: Jul 02, 1966   Cancelled Treatment:    Reason Eval/Treat Not Completed: PT screened, no needs identified, will sign off. Order received. Chart reviewed. Per chart review, pt is a LTC SNF resident, is non-ambulatory, and staff uses hoyer lift for OOB to chair at nursing facility. Recommend return to SNF once medically stable. Will sign off.  Addendum: [facility]-unclear of pt's mobility status. Possibly working with therapies?? but staff unable to provide clear information. Will proceed with PT eval here. Thanks.     [facility]  Office: [phone] Pager: [phone]

## 2021-07-02 NOTE — Evaluation (Signed)
Clinical/Bedside Swallow Evaluation Patient Details  Name: [name] MRN: [id number] Date of Birth: Feb 09, 1966  Today's Date: 07/02/2021 Time: SLP Start Time (ACUTE ONLY): 2 SLP Stop Time (ACUTE ONLY): 1117 SLP Time Calculation (min) (ACUTE ONLY): 14 min  Past Medical History:  Past Medical History:  Diagnosis Date   Alcohol use    Ankle fracture, lateral malleolus, closed 2013   Anxiety    Aortic atherosclerosis ([place]) 03/10/2021   Breast mass, left 2013   CHF (congestive heart failure) ([address])    a. EF 20-25% by echo in 05/2016 with cath showing normal cors b. EF 50-55% in 07/2020 c. 01/2021: EF at 55-60% with moderate LVH   Chronic anemia    CKD (chronic kidney disease), stage III (HCC)    Cocaine abuse (HCC)    COPD (chronic obstructive pulmonary disease) ([place])    Diabetes mellitus, type 2 ([place])    Diabetic Charcot foot ([place])    Essential hypertension    History of cardiomyopathy    History of GI bleed 2011   Hyperlipidemia    Noncompliance    Obesity    PAF (paroxysmal atrial fibrillation) (HCC)    Panic attacks    PAT (paroxysmal atrial tachycardia) (HCC)    Previously on Amiodarone   Seizures ([place]) 10/01/2020   Sleep apnea    Not on CPAP   Stroke ([place]) 2018   Tobacco abuse    Urinary incontinence    Past Surgical History:  Past Surgical History:  Procedure Laterality Date   ANGIOPLASTY ILLIAC ARTERY Right 04/23/2021   Procedure: ANGIOPLASTY AND STENT SUPERFICIAL FEMORAL ARTERY;  Surgeon: Cherre Robins, MD;  Location: [facility];  Service: Vascular;  Laterality: Right;   AORTOGRAM  04/23/2021   Procedure: AORTOGRAM;  Surgeon: Cherre Robins, MD;  Location: [facility];  Service: Vascular;;   AORTOGRAM Left 04/30/2021   Procedure: left lower extremity angiogram with second order cannulation;  Surgeon: Cherre Robins, MD;  Location: [facility];  Service: Vascular;  Laterality: Left;   BIOPSY  11/12/2020   Procedure: BIOPSY;  Surgeon: Harvel Quale, MD;   [facility];  Service: Gastroenterology;;   BREAST BIOPSY     CARDIAC CATHETERIZATION N/A 07/28/2016   Procedure: Left Heart Cath and Coronary Angiography;  Surgeon: Jettie Booze, MD;  Location: [facility];  Service: Cardiovascular;  Laterality: N/A;   COLONOSCOPY N/A 05/10/2019   Procedure: COLONOSCOPY;  Surgeon: Danie Binder, MD;  [facility];  Service: Endoscopy;  Laterality: N/A;  Phenergan 12.5 mg IV in pre-op   DILATION AND CURETTAGE OF UTERUS     ESOPHAGOGASTRODUODENOSCOPY (EGD) WITH PROPOFOL N/A 11/12/2020   Procedure: ESOPHAGOGASTRODUODENOSCOPY (EGD) WITH PROPOFOL;  Surgeon: Harvel Quale, MD;  [facility];  Service: Gastroenterology;  Laterality: N/A;   I & D EXTREMITY Bilateral 09/22/2017   Procedure: BILATERAL DEBRIDEMENT LEG/FOOT ULCERS, APPLY VERAFLO WOUND VAC;  Surgeon: Newt Minion, MD;  Location: [facility];  Service: Orthopedics;  Laterality: Bilateral;   I & D EXTREMITY Right 10/11/2018   Procedure: IRRIGATION AND DEBRIDEMENT RIGHT HAND;  Surgeon: [name], MD;  Location: [facility];  Service: Orthopedics;  Laterality: Right;   I & D EXTREMITY Right 10/13/2018   Procedure: REPEAT IRRIGATION AND DEBRIDEMENT RIGHT HAND;  Surgeon: [name], MD;  Location: [facility];  Service: Orthopedics;  Laterality: Right;   I & D EXTREMITY Right 11/22/2018   Procedure: IRRIGATION AND DEBRIDEMENT AND PINNING RIGHT HAND;  Surgeon: [name], MD;  Location: [facility];  Service: Orthopedics;  Laterality: Right;   IR RADIOLOGIST EVAL & MGMT  07/05/2018   LOWER EXTREMITY ANGIOGRAM Bilateral 04/23/2021   Procedure: RIGHT  AND LEFT LOWER EXTREMITY ANGIOGRAM;  Surgeon: Cherre Robins, MD;  Location: [facility];  Service: Vascular;  Laterality: Bilateral;   POLYPECTOMY  05/10/2019   Procedure: POLYPECTOMY;  Surgeon: Danie Binder, MD;  [facility];  Service: Endoscopy;;   SKIN SPLIT GRAFT Bilateral 09/28/2017   Procedure:  BILATERAL SPLIT THICKNESS SKIN GRAFT LEGS/FEET AND APPLY VAC;  Surgeon: Newt Minion, MD;  Location: [facility];  Service: Orthopedics;  Laterality: Bilateral;   SKIN SPLIT GRAFT Right 11/22/2018   Procedure: SKIN GRAFT SPLIT THICKNESS;  Surgeon: [name], MD;  Location: [facility];  Service: Orthopedics;  Laterality: Right;   HPI:  [name] is a 55 y.o. female with who brought to ED with acute encephalopathy. She was found to be COVID+. CXR 9/6 with no active disease.  And Head CT 9/6 with no acute findings.  Pt has hx of history of combined CHF with recovered EF, COPD/chronic RF, IDDM-2, CVA, paroxysmal A. fib on Xarelto, neuropathy, spinal stenosis, seizure disorder, anxiety, depression, tobacco use, urinary incontinence and chronic bilateral lower extremity wounds.   Assessment / Plan / Recommendation Clinical Impression  Pt presents with a predominately oral dysphagia which appears to be related, at least in part to her not having her dentures available, and may also be related to AMS.  Pt exhibited adequate oral clearance of puree. With initial trial of regular solid, pt expectorated graham cracker because she could not chew it.  With mechanical soft solids there was prolonged oral phase with oral residue/holding.  Pt declined trials of simulated ground consistency, she declined multiple trials of different textures and consistencies across the assessment because she did not like them.  Discussed diet preference with pt who is adamant that she would only consume regular texture diet without any modification.  Pt attempted another trial of regular solids.  After extremely long oral phase and liquid wash, pt acheived at least partial cleance of solid.  Pt was reluctant to allow for visualization of oral cavity.  Pt was seen by this service previously in November of 2021 without her dentures at that time, as well, and was recommended a puree diet with thin liquids.    Based on today's assessment,  recommend puree diet with thin liquids; however, based on pt's strong diet preference for regular a diet with less mechanical alteration may be advisable.  Sent secure message to attending requesting input.  Will defer diet decision to team.   SLP Visit Diagnosis: Dysphagia, oral phase (R13.11)    Aspiration Risk  Mild aspiration risk    Diet Recommendation Dysphagia 1 (Puree);Thin liquid   Liquid Administration via: Cup;Straw Medication Administration:  (as tolerated) Supervision: Staff to assist with self feeding Compensations: Slow rate;Small sips/bites Postural Changes: Seated upright at 90 degrees    Other  Recommendations Oral Care Recommendations: Oral care BID   Follow up Recommendations None      Frequency and Duration min 2x/week  2 weeks       Prognosis Prognosis for Safe Diet Advancement: Fair Barriers to Reach Goals:  (dentures not present)      Swallow Study   General Date of Onset: 07/01/21 HPI: TAMETRIA PASCHALL is a 55 y.o. female with who brought to ED with acute encephalopathy. She was found  to be COVID+. CXR 9/6 with no active disease.  And Head CT 9/6 with no acute findings.  Pt has hx of history of combined CHF with recovered EF, COPD/chronic RF, IDDM-2, CVA, paroxysmal A. fib on Xarelto, neuropathy, spinal stenosis, seizure disorder, anxiety, depression, tobacco use, urinary incontinence and chronic bilateral lower extremity wounds. Type of Study: Bedside Swallow Evaluation Previous Swallow Assessment: November 2021 with recs for puree/thin, without dentures in place Diet Prior to this Study: Regular Temperature Spikes Noted: No Respiratory Status: Room air History of Recent Intubation: No Behavior/Cognition: Alert;Uncooperative Oral Cavity Assessment: Within Functional Limits Oral Care Completed by SLP: No Oral Cavity - Dentition: Edentulous Self-Feeding Abilities: Able to feed self Patient Positioning: Upright in bed Baseline Vocal Quality: Low  vocal intensity Volitional Cough: Weak Volitional Swallow: Able to elicit    Oral/Motor/Sensory Function Overall Oral Motor/Sensory Function: Within functional limits Facial ROM: Within Functional Limits Facial Symmetry: Within Functional Limits Lingual ROM: Within Functional Limits Lingual Symmetry: Within Functional Limits Lingual Strength: Within Functional Limits Velum: Within Functional Limits Mandible: Within Functional Limits   Ice Chips Ice chips: Not tested   Thin Liquid Thin Liquid: Within functional limits Presentation: Straw    Nectar Thick Nectar Thick Liquid: Not tested   Honey Thick Honey Thick Liquid: Not tested   Puree Puree: Within functional limits Presentation: Spoon   Solid     Solid: Impaired Presentation: Self Fed (SLP fed) Oral Phase Impairments: Impaired mastication Oral Phase Functional Implications: Prolonged oral transit;Oral holding;Oral residue      Celedonio Savage, MA, [place] Office: [phone]  07/02/2021,11:37 AM
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
## 2021-07-02 NOTE — Progress Notes (Signed)
Patient continues to refuse lab draws.

## 2021-07-03 LAB — GLUCOSE, CAPILLARY: Glucose-Capillary: 295 mg/dL — ABNORMAL HIGH (ref 70–99)

## 2021-07-03 MED ORDER — ROSUVASTATIN CALCIUM 10 MG PO TABS
10.0000 mg | ORAL_TABLET | Freq: Every evening | ORAL | Status: DC
  Administered 2021-07-03 – 2021-07-04 (×2): 10 mg via ORAL
  Filled 2021-07-03 (×2): qty 1

## 2021-07-03 MED ORDER — METOPROLOL SUCCINATE ER 50 MG PO TB24
50.0000 mg | ORAL_TABLET | Freq: Every day | ORAL | Status: DC
  Administered 2021-07-03 – 2021-07-04 (×2): 50 mg via ORAL
  Filled 2021-07-03 (×2): qty 1

## 2021-07-03 MED ORDER — ZINC SULFATE 220 (50 ZN) MG PO CAPS
220.0000 mg | ORAL_CAPSULE | Freq: Every day | ORAL | Status: DC
  Administered 2021-07-04: 220 mg via ORAL
  Filled 2021-07-03: qty 1

## 2021-07-03 MED ORDER — FLUOXETINE HCL 20 MG PO CAPS
20.0000 mg | ORAL_CAPSULE | Freq: Every day | ORAL | Status: DC
  Administered 2021-07-03 – 2021-07-04 (×2): 20 mg via ORAL
  Filled 2021-07-03 (×2): qty 1

## 2021-07-03 MED ORDER — BENZTROPINE MESYLATE 0.5 MG PO TABS
0.5000 mg | ORAL_TABLET | Freq: Two times a day (BID) | ORAL | Status: DC
  Administered 2021-07-03 – 2021-07-04 (×3): 0.5 mg via ORAL
  Filled 2021-07-03 (×3): qty 1

## 2021-07-03 MED ORDER — OXYBUTYNIN CHLORIDE ER 5 MG PO TB24
5.0000 mg | ORAL_TABLET | Freq: Every morning | ORAL | Status: DC
  Administered 2021-07-04: 5 mg via ORAL
  Filled 2021-07-03: qty 1

## 2021-07-03 MED ORDER — HYDROMORPHONE HCL 1 MG/ML IJ SOLN
0.5000 mg | Freq: Once | INTRAMUSCULAR | Status: AC
  Administered 2021-07-03: 0.5 mg via INTRAVENOUS
  Filled 2021-07-03: qty 0.5

## 2021-07-03 MED ORDER — DULAGLUTIDE 3 MG/0.5ML ~~LOC~~ SOAJ: 3.0000 mg | SUBCUTANEOUS | Status: DC

## 2021-07-03 MED ORDER — NICOTINE 21 MG/24HR TD PT24
21.0000 mg | MEDICATED_PATCH | Freq: Every day | TRANSDERMAL | Status: DC
  Administered 2021-07-03 – 2021-07-04 (×2): 21 mg via TRANSDERMAL
  Filled 2021-07-03 (×2): qty 1

## 2021-07-03 MED ORDER — LEVETIRACETAM 500 MG PO TABS
750.0000 mg | ORAL_TABLET | Freq: Two times a day (BID) | ORAL | Status: DC
  Administered 2021-07-03 – 2021-07-04 (×3): 750 mg via ORAL
  Filled 2021-07-03 (×4): qty 1

## 2021-07-03 MED ORDER — PANTOPRAZOLE SODIUM 40 MG PO TBEC
40.0000 mg | DELAYED_RELEASE_TABLET | Freq: Every day | ORAL | Status: DC
  Administered 2021-07-03 – 2021-07-04 (×2): 40 mg via ORAL
  Filled 2021-07-03 (×2): qty 1

## 2021-07-03 MED ORDER — APIXABAN 5 MG PO TABS
5.0000 mg | ORAL_TABLET | Freq: Two times a day (BID) | ORAL | Status: DC
  Administered 2021-07-03 – 2021-07-04 (×3): 5 mg via ORAL
  Filled 2021-07-03 (×3): qty 1

## 2021-07-03 MED ORDER — ASPIRIN EC 81 MG PO TBEC
81.0000 mg | DELAYED_RELEASE_TABLET | Freq: Every day | ORAL | Status: DC
  Administered 2021-07-03 – 2021-07-04 (×2): 81 mg via ORAL
  Filled 2021-07-03 (×2): qty 1

## 2021-07-03 MED ORDER — SENNOSIDES-DOCUSATE SODIUM 8.6-50 MG PO TABS
1.0000 | ORAL_TABLET | Freq: Every day | ORAL | Status: DC
  Administered 2021-07-04: 1 via ORAL
  Filled 2021-07-03 (×2): qty 1

## 2021-07-03 NOTE — Plan of Care (Signed)
  Problem: Elimination: Goal: Will not experience complications related to urinary retention Outcome: Progressing   Problem: Pain Managment: Goal: General experience of comfort will improve Outcome: Progressing   Problem: Safety: Goal: Ability to remain free from injury will improve Outcome: Progressing   Problem: Skin Integrity: Goal: Risk for impaired skin integrity will decrease Outcome: Progressing   Problem: Respiratory: Goal: Will maintain a patent airway Outcome: Progressing   

## 2021-07-03 NOTE — Progress Notes (Signed)
Lab notified nurse that patient refused morning labs. On call provider notified of patient's refusal of lab work. On call provider was also notified of patient having 6 beats of NSVT.

## 2021-07-03 NOTE — Progress Notes (Signed)
PROGRESS NOTE    [name]  [id number] DOB: 06-09-1966 DOA: 07/01/2021 PCP: Caprice Renshaw, MD   Brief Narrative:  [name] is a 55 y.o. female with history of combined CHF with recovered EF, COPD/chronic RF, IDDM-2, CVA, paroxysmal A. fib on Xarelto, neuropathy, spinal stenosis, seizure disorder, anxiety, depression, tobacco use, urinary incontinence and chronic bilateral lower extremity wounds brought to ED with acute encephalopathy.    Patient admitted from facility due to worsening encephalopathy on baseline reported dementia.  Admitted for possible UTI versus polypharmacy etiology.   Assessment & Plan:   Acute toxic versus metabolic encephalopathy, POA, multifactorial, resolving -Continue to hold sedating medications given concern for polypharmacy -Likely back to baseline today -Treat UTI as below -COVID status post monoclonal antibody administration in the ED, remains without respiratory complaints or hypoxia   AKI with questionable history of CKD, POA Cannot rule out UTI, POA  -Patient does not meet sepsis criteria  -Ceftriaxone x3 days  -Follow repeat cultures -GFR >60 recently and appears to be baseline - does not meet criteria for CKD at this time with normal CrCl and GFR - (of note GFR earlier this year ranging from 30-50 but possibly transient)  Incidental COVID-19 positive, without symptoms, POA  - CXR without infiltrate. -Received monoclonal antibodies in ED -Airborne and contact precautions ongoing, unclear timeframe -questionably acute although could be lingering positive from previous infection.   Chronic combined CHF, not in acute exacerbation:  EF 60 to 65% on most recent echo  Dehydrated at intake, continue to encourage p.o. intak No longer on diuretics per med rec   paroxysmal A. fib-in sinus rhythm.  -CHA2DS2-VASc score 6.  Continue lovenox - med rec has xarelto and eliquis listed  - Continue metoprolol  Chronic COPD with questionable  chronic respiratory failure, without hypoxia  - Unclear if she is on oxygen at facility per signout, not currently requiring oxygen - Continue Breo Ellipta, nebs   Uncontrolled IDDM-2 with neuropathy and hyperglycemia:  - A1c 11.1% in 02/2021. - 6.1 now - CBG monitoring and SSI-sensitive while NPO   History of CVA - Continue home meds after med rec   Essential hypertension: Normotensive   Anxiety/depression-Haldol, Prozac, Ativan, trazodone on home medication list - not updated - unclear if this is accurate -Resume as appropriate after med rec   History of seizure-Keppra on home medication list -Seizure precaution -Continue home Keppra either IV or p.o. after med rec   Hypokalemia Follow repeat labs   Chronic bilateral lower extremity wound -Good DP pulses distally. -WOCN consult   Generalized weakness/debility -PT/OT eval   DVT prophylaxis: Restart DOAC (listed eliquis/xarelto on med rec - awaiting reconciliation)           Code Status: Full code -unable to confirm, family unavailable Family Communication: Patient's son hung up on previous provider, no answer today  Status is: Inpatient  Dispo: The patient is from: Facility              Anticipated d/c is to: Same              Anticipated d/c date is: 48 to 72 hours              Patient currently not medically stable for discharge  Consultants:  None  Procedures:  None  Antimicrobials:  Ceftriaxone  Subjective: No acute issues or events overnight, patient appears to be more awake and alert this morning but continues to be oriented to person only.  Review  of systems limited but denies chest pain shortness of breath headache fevers or chills  Objective: Vitals:   07/02/21 1242 07/02/21 2226 07/03/21 0500 07/03/21 0632  BP: 102/74 (!) 132/92  129/87  Pulse: 75 92  82  Resp: '16 20  19  '$ Temp: 98.2 F (36.8 C) 98.4 F (36.9 C)    TempSrc:  Oral    SpO2: 100% 99%  100%  Weight:   68.1 kg   Height:         Intake/Output Summary (Last 24 hours) at 07/03/2021 0721 Last data filed at 07/02/2021 1900 Gross per 24 hour  Intake 1507.32 ml  Output --  Net 1507.32 ml    Filed Weights   07/01/21 1551 07/02/21 0500 07/03/21 0500  Weight: 71 kg 65.7 kg 68.1 kg    Examination:  General:  Pleasantly resting in bed, No acute distress.  Alert to person only HEENT:  Normocephalic atraumatic.  Sclerae nonicteric, noninjected.  Extraocular movements intact bilaterally. Neck:  Without mass or deformity.  Trachea is midline. Lungs:  Clear to auscultate bilaterally without rhonchi, wheeze, or rales. Heart:  Regular rate and rhythm.  Without murmurs, rubs, or gallops. Abdomen:  Soft, nontender, nondistended.  Without guarding or rebound. Extremities: Without cyanosis, clubbing, edema, or obvious deformity. Vascular:  Dorsalis pedis and posterior tibial pulses palpable bilaterally. Skin:  Warm and dry, no erythema, bilateral lower extremity wounds, dressing clean dry intact   Data Reviewed: I have personally reviewed following labs and imaging studies  CBC: Recent Labs  Lab 07/01/21 1200  WBC 9.7  NEUTROABS 7.0  HGB 11.2*  HCT 36.1  MCV 90.7  PLT 467*    Basic Metabolic Panel: Recent Labs  Lab 07/01/21 1200  NA 145  K 2.8*  CL 112*  CO2 21*  GLUCOSE 190*  BUN 17  CREATININE 1.26*  CALCIUM 8.6*    GFR: Estimated Creatinine Clearance: 43.5 mL/min (A) (by C-G formula based on SCr of 1.26 mg/dL (H)). Liver Function Tests: Recent Labs  Lab 07/01/21 1200  AST 10*  ALT 9  ALKPHOS 98  BILITOT 0.7  PROT 7.0  ALBUMIN 2.2*    No results for input(s): LIPASE, AMYLASE in the last 168 hours. Recent Labs  Lab 07/01/21 1139  AMMONIA 23    Coagulation Profile: Recent Labs  Lab 07/01/21 1200  INR 1.8*    Cardiac Enzymes: No results for input(s): CKTOTAL, CKMB, CKMBINDEX, TROPONINI in the last 168 hours. BNP (last 3 results) No results for input(s): PROBNP in the last 8760  hours. HbA1C: Recent Labs    07/01/21 1718  HGBA1C 6.1*    CBG: Recent Labs  Lab 07/01/21 1749 07/01/21 2226 07/02/21 0738 07/02/21 1238 07/02/21 2213  GLUCAP 131* 96 112* 166* 134*    Lipid Profile: No results for input(s): CHOL, HDL, LDLCALC, TRIG, CHOLHDL, LDLDIRECT in the last 72 hours. Thyroid Function Tests: No results for input(s): TSH, T4TOTAL, FREET4, T3FREE, THYROIDAB in the last 72 hours. Anemia Panel: No results for input(s): VITAMINB12, FOLATE, FERRITIN, TIBC, IRON, RETICCTPCT in the last 72 hours. Sepsis Labs: Recent Labs  Lab 07/01/21 1200  LATICACIDVEN 0.9     Recent Results (from the past 240 hour(s))  Resp Panel by RT-PCR (Flu A&B, Covid) Nasopharyngeal Swab     Status: Abnormal   Collection Time: 07/01/21 12:32 PM   Specimen: Nasopharyngeal Swab; Nasopharyngeal(NP) swabs in vial transport medium  Result Value Ref Range Status   SARS Coronavirus 2 by RT PCR POSITIVE (A) NEGATIVE  Final    Comment: RESULT CALLED TO, READ BACK BY AND VERIFIED WITH: FARRAR,S. RN AT [id number] 07/01/21 MULLINS,T    Influenza A by PCR NEGATIVE NEGATIVE Final   Influenza B by PCR NEGATIVE NEGATIVE Final    Comment: Performed at [facility], [place] [address], [place], [place] [address]          Radiology Studies: CT HEAD WO CONTRAST  Result Date: 07/01/2021 CLINICAL DATA:  Altered mental status. EXAM: CT HEAD WITHOUT CONTRAST TECHNIQUE: Contiguous axial images were obtained from the base of the skull through the vertex without intravenous contrast. COMPARISON:  CT head dated June 17, 2021. FINDINGS: Brain: No evidence of acute infarction, hemorrhage, hydrocephalus, extra-axial collection or mass lesion/mass effect. Stable atrophy and chronic microvascular ischemic changes. Old infarcts in the left caudate head, left thalamus, and left cerebellum again noted. Vascular: Atherosclerotic vascular calcification of the carotid siphons. No hyperdense vessel.  Skull: Normal. Negative for fracture or focal lesion. Sinuses/Orbits: No acute finding. Other: None. IMPRESSION: 1. No acute intracranial abnormality. 2. Stable atrophy, chronic microvascular ischemic changes, and old infarcts. Electronically Signed   By: Titus Dubin M.D.   On: 07/01/2021 12:25   DG Chest Port 1 View  Result Date: 07/01/2021 CLINICAL DATA:  Altered mental status. EXAM: PORTABLE CHEST 1 VIEW COMPARISON:  Chest x-ray dated April 21, 2021. FINDINGS: The patient is rotated to the right. Stable cardiomegaly. Normal pulmonary vascularity. No focal consolidation, pleural effusion, or pneumothorax. No acute osseous abnormality. IMPRESSION: No active disease. Electronically Signed   By: Titus Dubin M.D.   On: 07/01/2021 12:21     Scheduled Meds:  collagenase   Topical Daily   enoxaparin (LOVENOX) injection  70 mg Subcutaneous Q12H   fluticasone furoate-vilanterol  1 puff Inhalation Daily   insulin aspart  0-5 Units Subcutaneous QHS   insulin aspart  0-9 Units Subcutaneous TID WC   mouth rinse  15 mL Mouth Rinse BID   Continuous Infusions:  cefTRIAXone (ROCEPHIN)  IV 1 g (07/02/21 1643)   levETIRAcetam 750 mg (07/02/21 2037)     LOS: 1 day   Time spent: 63mn  Nemesio Castrillon C Romonda Parker, DO [facility]  If 7PM-7AM, please contact night-coverage [email or web address]  07/03/2021, 7:21 AM

## 2021-07-03 NOTE — TOC Progression Note (Addendum)
Transition of Care [facility]) - Progression Note    Patient Details  Name: [name] MRN: [id number] Date of Birth: 1966/06/23  Transition of Care [facility]) CM/SW Contact  Leva Baine, Juliann Pulse, RN Phone Number: 07/03/2021, 12:02 PM  Clinical Narrative:  [name] with [facility] Josem Kaufmann [phone] pending for skilled wound care for [name] aware covid+ PTA-can return once auth received.  4:30p-insurance requesting peer to peer-provided Dr. Avon Gully with tel# [phone],name,dob,member [id number] be done by 11a tomorrow.    Expected Discharge Plan: Skilled Nursing Facility Barriers to Discharge: Insurance Authorization  Expected Discharge Plan and Services Expected Discharge Plan: [place]   Discharge Planning Services: CM Consult Post Acute Care Choice: [place] Living arrangements for the past 2 months: [place]                                       Social Determinants of Health (SDOH) Interventions    Readmission Risk Interventions Readmission Risk Prevention Plan 10/31/2020 08/27/2020  Transportation Screening Complete Complete  Medication Review Press photographer) Complete Complete  PCP or Specialist appointment within 3-5 days of discharge - Complete  HRI or [place] - Complete  SW Recovery Care/Counseling Consult Complete Complete  Palliative Care Screening Not Applicable Not Applicable  Skilled Nursing Facility Complete Complete  Some recent data might be hidden

## 2021-07-03 NOTE — Plan of Care (Signed)
  Problem: Health Behavior/Discharge Planning: Goal: Ability to manage health-related needs will improve Outcome: Progressing   Problem: Education: Goal: Knowledge of General Education information will improve Description: Including pain rating scale, medication(s)/side effects and non-pharmacologic comfort measures Outcome: Progressing   Problem: Clinical Measurements: Goal: Respiratory complications will improve Outcome: Progressing

## 2021-07-03 NOTE — Progress Notes (Signed)
Notified on call provider at least twice about patient having severe pain due to bilateral lower extremity wounds. Patient only had PRN Tylenol. On call provider put in new orders. Also, notified on call provider about patient calling 911 quite a few times. Patient continues to be alert and oriented X 2.

## 2021-07-03 NOTE — Discharge Summary (Signed)
Physician Discharge Summary  [name] [id number] DOB: 31-Aug-1966 DOA: 07/01/2021  PCP: Caprice Renshaw, MD  Admit date: 07/01/2021 Discharge date: 07/04/2021  Admitted From: Facility Disposition: Same  Recommendations for Outpatient Follow-up:  Follow up with PCP in 1-2 weeks Please obtain BMP/CBC in one week  Discharge Condition: Stable CODE STATUS: Full Diet recommendation: As tolerated low-salt low-fat diabetic diet  Brief/Interim Summary: [name] is a 55 y.o. female with history of combined CHF with recovered EF, COPD/chronic RF, IDDM-2, CVA, paroxysmal A. fib on Xarelto, neuropathy, spinal stenosis, seizure disorder, anxiety, depression, tobacco use, urinary incontinence and chronic bilateral lower extremity wounds brought to ED with acute encephalopathy.    Patient admitted from facility due to worsening encephalopathy on baseline reported dementia.  Admitted for possible UTI versus polypharmacy etiology.  Patient's mental status resolved back to baseline, again unclear etiology given multifactorial concerns with polypharmacy and UTI as well as recent COVID.  Regardless after monoclonal antibody, antibiotics and cessation of sedating medications patient's mental status returned back to baseline and is otherwise stable and agreeable for return to previous facility.    Assessment & Plan:   Acute toxic versus metabolic encephalopathy, POA, multifactorial -Continue to hold sedating medications -rehab facility can resume pain medications as indicated but will discontinue at discharge given resolution of symptoms and currently well controlled on acetaminophen only -Treat UTI as below -COVID status post monoclonal antibody administration in the ED, remains without respiratory complaints or hypoxia   AKI with questionable history of CKD, POA Cannot rule out UTI, POA  -Patient does not meet sepsis criteria  -Continue ceftriaxone x3 days  -Follow repeat cultures -GFR >60  recently and appears to be baseline - does not meet criteria for CKD at this time - GFR earlier this year ranging from 30-50.   Incidental COVID-19 positive, without symptoms, POA  - CXR without infiltrate. -Received monoclonal antibodies in ED -Airborne and contact precautions to  be discontinued per CDC guidelines   Chronic combined CHF, not in acute exacerbation:  - EF 60 to 65% on most recent echo  - Dehydrated at intake, continue to encourage p.o. intake, low rate IV fluids ongoing, monitor closely  - No longer on diuretics per med rec    paroxysmal A. fib-in sinus rhythm.  - CHA2DS2-VASc score 6.  Continue Eliquis and metoprolol   Chronic COPD with questionable chronic respiratory failure, without hypoxia  - Unclear if she is on oxygen at facility per signout, not currently requiring oxygen - Continue Breo Ellipta, nebs   Uncontrolled IDDM-2 with neuropathy and hyperglycemia:  - A1c 11.1% in 02/2021. - 6.1 now - CBG monitoring and SSI-sensitive while NPO   History of CVA - Continue home meds after med rec   Essential hypertension: Normotensive   Anxiety/depression Continue home medications including Prozac No longer taking Haldol Ativan trazodone -would not recommend these medications in this patient given her low threshold for toxic encephalopathy   History of seizure-Keppra on home medication list -Continue Keppra   Hypokalemia Follow repeat labs   Chronic bilateral lower extremity wound -Good DP pulses distally. -WOCN consult   Generalized weakness/debility -PT/OT eval ongoing at [facility]   Discharge Instructions  Discharge Instructions     Call MD for:  persistant dizziness or light-headedness   Complete by: As directed    Call MD for:  temperature >100.4   Complete by: As directed    Diet - low sodium heart healthy   Complete by: As directed  Discharge wound care:   Complete by: As directed    Wound care  Daily      Comments: Clean RLE wounds with  saline, pat dry (per patient tolerance).  Apply 1/4" thick layer of Santyl to the wounds, top with 2x2 moist with saline, top with foam. Change daily, ok to lift foam to re-apply Santyl and gauze daily.  Foam dressing  Every 3 days     Comments: Silicone foam dressings to the LLE wound change every 3 days. Assess under dressings each shift for any acute changes in the wounds.   Increase activity slowly   Complete by: As directed       Allergies as of 07/04/2021   No Known Allergies      Medication List     STOP taking these medications    haloperidol 5 MG tablet Commonly known as: HALDOL   oxyCODONE-acetaminophen 5-325 MG tablet Commonly known as: PERCOCET/ROXICET   traZODone 50 MG tablet Commonly known as: DESYREL       TAKE these medications    acetaminophen 325 MG tablet Commonly known as: TYLENOL Take 2 tablets (650 mg total) by mouth every 6 (six) hours as needed for headache, fever or mild pain. What changed: reasons to take this   Alogliptin Benzoate 25 MG Tabs Take 1 tablet by mouth daily.   amLODipine 10 MG tablet Commonly known as: NORVASC Take 1 tablet (10 mg total) by mouth daily.   aspirin 81 MG EC tablet Take 1 tablet (81 mg total) by mouth daily. Swallow whole.   benztropine 0.5 MG tablet Commonly known as: COGENTIN Take 1 tablet (0.5 mg total) by mouth 2 (two) times daily.   dextrose 5 % and 0.45% NaCl infusion Inject 100 mL/hr into the vein See admin instructions. 100 ml's/hr subq every hour for hydration for 8 hours; "1 liter via clysis"   Eliquis 5 MG Tabs tablet Generic drug: apixaban Take 5 mg by mouth 2 (two) times daily.   FLUoxetine 20 MG capsule Commonly known as: PROZAC Take 1 capsule (20 mg total) by mouth daily.   HumaLOG KwikPen 100 UNIT/ML KwikPen Generic drug: insulin lispro Inject 1-10 Units into the skin See admin instructions. Inject 1-10 units into the skin before meals and at bedtime, PER SLIDING SCALE: BGL 151-200 = 1  unit; 201-250 = 2 units; 251-300 = 4 units; 301-350 = 6 units; 351-400 = 8 units; 401-450 = 10 units   Lantus SoloStar 100 UNIT/ML Solostar Pen Generic drug: insulin glargine Inject 8 Units into the skin in the morning.   levETIRAcetam 750 MG tablet Commonly known as: KEPPRA Take 1 tablet (750 mg total) by mouth 2 (two) times daily.   metoprolol succinate 50 MG 24 hr tablet Commonly known as: TOPROL-XL Take 1 tablet (50 mg total) by mouth daily. Take with or immediately following a meal. Start taking on: July 05, 2021 What changed:  medication strength how much to take additional instructions   nicotine 7 mg/24hr patch Commonly known as: NICODERM CQ - dosed in mg/24 hr Place 1 patch (7 mg total) onto the skin daily.   omeprazole 20 MG capsule Commonly known as: PRILOSEC Take 20 mg by mouth daily before breakfast.   oxybutynin 5 MG 24 hr tablet Commonly known as: DITROPAN-XL Take 5 mg by mouth in the morning.   rosuvastatin 10 MG tablet Commonly known as: CRESTOR Take 10 mg by mouth every evening.   sennosides-docusate sodium 8.6-50 MG tablet Commonly known as: SENOKOT-S  Take 1 tablet by mouth in the morning and at bedtime.   Spiriva HandiHaler 18 MCG inhalation capsule Generic drug: tiotropium Place 18 mcg into inhaler and inhale daily as needed (Shortness of breath).   Trulicity 3 0000000 Sopn Generic drug: Dulaglutide Inject 3 mg into the skin every Friday.   zinc sulfate 220 (50 Zn) MG capsule Take 1 capsule (220 mg total) by mouth daily.               Discharge Care Instructions  (From admission, onward)           Start     Ordered   07/04/21 0000  Discharge wound care:       Comments: Wound care  Daily      Comments: Clean RLE wounds with saline, pat dry (per patient tolerance).  Apply 1/4" thick layer of Santyl to the wounds, top with 2x2 moist with saline, top with foam. Change daily, ok to lift foam to re-apply Santyl and gauze  daily.  Foam dressing  Every 3 days     Comments: Silicone foam dressings to the LLE wound change every 3 days. Assess under dressings each shift for any acute changes in the wounds.   07/04/21 1102            No Known Allergies  Consultations: None  Procedures/Studies: CT HEAD WO CONTRAST  Result Date: 07/01/2021 CLINICAL DATA:  Altered mental status. EXAM: CT HEAD WITHOUT CONTRAST TECHNIQUE: Contiguous axial images were obtained from the base of the skull through the vertex without intravenous contrast. COMPARISON:  CT head dated June 17, 2021. FINDINGS: Brain: No evidence of acute infarction, hemorrhage, hydrocephalus, extra-axial collection or mass lesion/mass effect. Stable atrophy and chronic microvascular ischemic changes. Old infarcts in the left caudate head, left thalamus, and left cerebellum again noted. Vascular: Atherosclerotic vascular calcification of the carotid siphons. No hyperdense vessel. Skull: Normal. Negative for fracture or focal lesion. Sinuses/Orbits: No acute finding. Other: None. IMPRESSION: 1. No acute intracranial abnormality. 2. Stable atrophy, chronic microvascular ischemic changes, and old infarcts. Electronically Signed   By: Titus Dubin M.D.   On: 07/01/2021 12:25   CT Head Wo Contrast  Result Date: 06/17/2021 CLINICAL DATA:  Fall. EXAM: CT HEAD WITHOUT CONTRAST CT CERVICAL SPINE WITHOUT CONTRAST TECHNIQUE: Multidetector CT imaging of the head and cervical spine was performed following the standard protocol without intravenous contrast. Multiplanar CT image reconstructions of the cervical spine were also generated. COMPARISON:  CT head and cervical spine dated June 08, 2021. FINDINGS: CT HEAD FINDINGS Brain: No evidence of acute infarction, hemorrhage, hydrocephalus, extra-axial collection or mass lesion/mass effect. Stable atrophy and chronic microvascular ischemic changes. Old left cerebellar infarct again noted. Old left caudate head and left  thalamic lacunar infarcts again noted. Vascular: Atherosclerotic vascular calcification of the carotid siphons. No hyperdense vessel. Skull: Normal. Negative for fracture or focal lesion. Sinuses/Orbits: No acute finding. Other: None. CT CERVICAL SPINE FINDINGS Alignment: No traumatic malalignment. Skull base and vertebrae: No acute fracture. No primary bone lesion or focal pathologic process. Soft tissues and spinal canal: No prevertebral fluid or swelling. No visible canal hematoma. Disc levels: Unchanged multilevel disc height loss, severe at C4-C5 and C5-C6. Upper chest: Negative. Other: None. IMPRESSION: 1. No acute intracranial abnormality. Stable atrophy, chronic microvascular ischemic changes and old infarcts. 2. No acute cervical spine fracture or traumatic malalignment. Unchanged multilevel cervical spondylosis. Electronically Signed   By: Titus Dubin M.D.   On: 06/17/2021 18:49   CT  Head Wo Contrast  Result Date: 06/08/2021 CLINICAL DATA:  Head trauma, mod-severe; Neck trauma, dangerous injury mechanism (Age 67-64y). Unwitnessed fall from bed. EXAM: CT HEAD WITHOUT CONTRAST CT CERVICAL SPINE WITHOUT CONTRAST TECHNIQUE: Multidetector CT imaging of the head and cervical spine was performed following the standard protocol without intravenous contrast. Multiplanar CT image reconstructions of the cervical spine were also generated. COMPARISON:  CT head 04/01/2021 FINDINGS: CT HEAD FINDINGS Brain: Normal anatomic configuration. Parenchymal volume loss is commensurate with the patient's age. Moderate periventricular white matter changes are present likely reflecting the sequela of small vessel ischemia. Remote lacunar infarcts are noted within the a left caudate head, anterior limb of the left internal capsule, and left thalamus. Remote infarct noted within the left cerebellar hemisphere. No abnormal intra or extra-axial mass lesion or fluid collection. No abnormal mass effect or midline shift. No  evidence of acute intracranial hemorrhage or infarct. Ventricular size is normal. Cerebellum unremarkable. Vascular: No asymmetric hyperdense vasculature at the skull base. Skull: Intact Sinuses/Orbits: Paranasal sinuses are clear. Orbits are unremarkable. Other: Mastoid air cells and middle ear cavities are clear. CT CERVICAL SPINE FINDINGS Alignment: There is mild cervical kyphosis, possibly positional in nature. No listhesis. Skull base and vertebrae: Craniocervical alignment is normal. The atlantodental interval is not widened. No acute fracture of the cervical spine. Vertebral body height has been preserved. Soft tissues and spinal canal: Posterior disc osteophyte complex results in moderate to severe central canal stenosis with a an AP diameter of the spinal canal of approximately 7-8 mm and flattening of the thecal sac at C4-5 and C5-6. Low-dose technique limits evaluation and the degree of stenosis may be greater at C5-6 than apparent on this examination. Milder stent of changes are noted at C6-7. No canal hematoma. No prevertebral soft tissue swelling. No paravertebral fluid collections identified. Disc levels: There is intervertebral disc space narrowing and endplate remodeling at D34-534 and C5-6 in keeping with changes of moderate to severe degenerative disc disease. Remaining intervertebral disc heights are preserved. The prevertebral soft tissues are not thickened on sagittal reformats. Review of the axial images demonstrates advanced uncovertebral arthrosis at C4-5 and C5-6 resulting in moderate bilateral neuroforaminal narrowing. Upper chest: Reticular infiltrates are noted within the visualized lung apices, not well assessed on this exam. Other: None IMPRESSION: No acute intracranial injury.  No calvarial fracture. No acute fracture or listhesis of the cervical spine. Advanced degenerative disc disease and degenerative joint disease at C4-5 and C5-6 resulting in moderate to severe central canal  stenosis. The degree of stenosis is not optimally assessed on this examination and correlation with neurologic examination is recommended. If abnormal, MRI examination may better demonstrate the degree of central canal and neuroforaminal stenosis at these levels. Electronically Signed   By: Fidela Salisbury M.D.   On: 06/08/2021 17:20   CT Cervical Spine Wo Contrast  Result Date: 06/17/2021 CLINICAL DATA:  Fall. EXAM: CT HEAD WITHOUT CONTRAST CT CERVICAL SPINE WITHOUT CONTRAST TECHNIQUE: Multidetector CT imaging of the head and cervical spine was performed following the standard protocol without intravenous contrast. Multiplanar CT image reconstructions of the cervical spine were also generated. COMPARISON:  CT head and cervical spine dated June 08, 2021. FINDINGS: CT HEAD FINDINGS Brain: No evidence of acute infarction, hemorrhage, hydrocephalus, extra-axial collection or mass lesion/mass effect. Stable atrophy and chronic microvascular ischemic changes. Old left cerebellar infarct again noted. Old left caudate head and left thalamic lacunar infarcts again noted. Vascular: Atherosclerotic vascular calcification of the carotid siphons. No  hyperdense vessel. Skull: Normal. Negative for fracture or focal lesion. Sinuses/Orbits: No acute finding. Other: None. CT CERVICAL SPINE FINDINGS Alignment: No traumatic malalignment. Skull base and vertebrae: No acute fracture. No primary bone lesion or focal pathologic process. Soft tissues and spinal canal: No prevertebral fluid or swelling. No visible canal hematoma. Disc levels: Unchanged multilevel disc height loss, severe at C4-C5 and C5-C6. Upper chest: Negative. Other: None. IMPRESSION: 1. No acute intracranial abnormality. Stable atrophy, chronic microvascular ischemic changes and old infarcts. 2. No acute cervical spine fracture or traumatic malalignment. Unchanged multilevel cervical spondylosis. Electronically Signed   By: Titus Dubin M.D.   On: 06/17/2021  18:49   CT Cervical Spine Wo Contrast  Result Date: 06/08/2021 CLINICAL DATA:  Head trauma, mod-severe; Neck trauma, dangerous injury mechanism (Age 24-64y). Unwitnessed fall from bed. EXAM: CT HEAD WITHOUT CONTRAST CT CERVICAL SPINE WITHOUT CONTRAST TECHNIQUE: Multidetector CT imaging of the head and cervical spine was performed following the standard protocol without intravenous contrast. Multiplanar CT image reconstructions of the cervical spine were also generated. COMPARISON:  CT head 04/01/2021 FINDINGS: CT HEAD FINDINGS Brain: Normal anatomic configuration. Parenchymal volume loss is commensurate with the patient's age. Moderate periventricular white matter changes are present likely reflecting the sequela of small vessel ischemia. Remote lacunar infarcts are noted within the a left caudate head, anterior limb of the left internal capsule, and left thalamus. Remote infarct noted within the left cerebellar hemisphere. No abnormal intra or extra-axial mass lesion or fluid collection. No abnormal mass effect or midline shift. No evidence of acute intracranial hemorrhage or infarct. Ventricular size is normal. Cerebellum unremarkable. Vascular: No asymmetric hyperdense vasculature at the skull base. Skull: Intact Sinuses/Orbits: Paranasal sinuses are clear. Orbits are unremarkable. Other: Mastoid air cells and middle ear cavities are clear. CT CERVICAL SPINE FINDINGS Alignment: There is mild cervical kyphosis, possibly positional in nature. No listhesis. Skull base and vertebrae: Craniocervical alignment is normal. The atlantodental interval is not widened. No acute fracture of the cervical spine. Vertebral body height has been preserved. Soft tissues and spinal canal: Posterior disc osteophyte complex results in moderate to severe central canal stenosis with a an AP diameter of the spinal canal of approximately 7-8 mm and flattening of the thecal sac at C4-5 and C5-6. Low-dose technique limits evaluation and  the degree of stenosis may be greater at C5-6 than apparent on this examination. Milder stent of changes are noted at C6-7. No canal hematoma. No prevertebral soft tissue swelling. No paravertebral fluid collections identified. Disc levels: There is intervertebral disc space narrowing and endplate remodeling at D34-534 and C5-6 in keeping with changes of moderate to severe degenerative disc disease. Remaining intervertebral disc heights are preserved. The prevertebral soft tissues are not thickened on sagittal reformats. Review of the axial images demonstrates advanced uncovertebral arthrosis at C4-5 and C5-6 resulting in moderate bilateral neuroforaminal narrowing. Upper chest: Reticular infiltrates are noted within the visualized lung apices, not well assessed on this exam. Other: None IMPRESSION: No acute intracranial injury.  No calvarial fracture. No acute fracture or listhesis of the cervical spine. Advanced degenerative disc disease and degenerative joint disease at C4-5 and C5-6 resulting in moderate to severe central canal stenosis. The degree of stenosis is not optimally assessed on this examination and correlation with neurologic examination is recommended. If abnormal, MRI examination may better demonstrate the degree of central canal and neuroforaminal stenosis at these levels. Electronically Signed   By: Fidela Salisbury M.D.   On: 06/08/2021 17:20  DG Chest Port 1 View  Result Date: 07/01/2021 CLINICAL DATA:  Altered mental status. EXAM: PORTABLE CHEST 1 VIEW COMPARISON:  Chest x-ray dated April 21, 2021. FINDINGS: The patient is rotated to the right. Stable cardiomegaly. Normal pulmonary vascularity. No focal consolidation, pleural effusion, or pneumothorax. No acute osseous abnormality. IMPRESSION: No active disease. Electronically Signed   By: Titus Dubin M.D.   On: 07/01/2021 12:21     Subjective: No acute issues or events overnight denies nausea vomiting diarrhea constipation headache fevers  chills or chest pain   Discharge Exam: Vitals:   07/04/21 0110 07/04/21 0524  BP: 120/84 (!) 115/92  Pulse: 66 89  Resp: 18 18  Temp: 98 F (36.7 C) (!) 97.4 F (36.3 C)  SpO2: 98% 100%   Vitals:   07/03/21 1900 07/04/21 0110 07/04/21 0500 07/04/21 0524  BP: 102/85 120/84  (!) 115/92  Pulse: (!) 47 66  89  Resp: '20 18  18  '$ Temp: 98.1 F (36.7 C) 98 F (36.7 C)  (!) 97.4 F (36.3 C)  TempSrc: Oral Oral  Oral  SpO2: 100% 98%  100%  Weight:   67.6 kg   Height:        General: Pt is alert, awake, not in acute distress Cardiovascular: RRR, S1/S2 +, no rubs, no gallops Respiratory: CTA bilaterally, no wheezing, no rhonchi Abdominal: Soft, NT, ND, bowel sounds + Extremities: no edema, no cyanosis    The results of significant diagnostics from this hospitalization (including imaging, microbiology, ancillary and laboratory) are listed below for reference.     Microbiology: Recent Results (from the past 240 hour(s))  Resp Panel by RT-PCR (Flu A&B, Covid) Nasopharyngeal Swab     Status: Abnormal   Collection Time: 07/01/21 12:32 PM   Specimen: Nasopharyngeal Swab; Nasopharyngeal(NP) swabs in vial transport medium  Result Value Ref Range Status   SARS Coronavirus 2 by RT PCR POSITIVE (A) NEGATIVE Final    Comment: RESULT CALLED TO, READ BACK BY AND VERIFIED WITH: FARRAR,S. RN AT [id number] 07/01/21 MULLINS,T    Influenza A by PCR NEGATIVE NEGATIVE Final   Influenza B by PCR NEGATIVE NEGATIVE Final    Comment: Performed at [facility], [place] [address], [place],  [address]     Labs: BNP (last 3 results) Recent Labs    01/16/21 1055 03/09/21 1735  BNP 352.0* [id number]   Basic Metabolic Panel: Recent Labs  Lab 07/01/21 1200  NA 145  K 2.8*  CL 112*  CO2 21*  GLUCOSE 190*  BUN 17  CREATININE 1.26*  CALCIUM 8.6*   Liver Function Tests: Recent Labs  Lab 07/01/21 1200  AST 10*  ALT 9  ALKPHOS 98  BILITOT 0.7  PROT 7.0  ALBUMIN 2.2*    No results for input(s): LIPASE, AMYLASE in the last 168 hours. Recent Labs  Lab 07/01/21 1139  AMMONIA 23   CBC: Recent Labs  Lab 07/01/21 1200  WBC 9.7  NEUTROABS 7.0  HGB 11.2*  HCT 36.1  MCV 90.7  PLT 467*   Cardiac Enzymes: No results for input(s): CKTOTAL, CKMB, CKMBINDEX, TROPONINI in the last 168 hours. BNP: Invalid input(s): POCBNP CBG: Recent Labs  Lab 07/02/21 0738 07/02/21 1238 07/02/21 2213 07/03/21 2154 07/04/21 0747  GLUCAP 112* 166* 134* 295* 119*   D-Dimer No results for input(s): DDIMER in the last 72 hours. Hgb A1c Recent Labs    07/01/21 1718  HGBA1C 6.1*   Lipid Profile No results for input(s): CHOL, HDL,  LDLCALC, TRIG, CHOLHDL, LDLDIRECT in the last 72 hours. Thyroid function studies No results for input(s): TSH, T4TOTAL, T3FREE, THYROIDAB in the last 72 hours.  Invalid input(s): FREET3 Anemia work up No results for input(s): VITAMINB12, FOLATE, FERRITIN, TIBC, IRON, RETICCTPCT in the last 72 hours. Urinalysis    Component Value Date/Time   COLORURINE BROWN (A) 07/01/2021 1324   APPEARANCEUR TURBID (A) 07/01/2021 1324   LABSPEC 1.020 07/01/2021 1324   PHURINE 6.0 07/01/2021 1324   GLUCOSEU NEGATIVE 07/01/2021 1324   HGBUR LARGE (A) 07/01/2021 1324   BILIRUBINUR MODERATE (A) 07/01/2021 1324   KETONESUR 20 (A) 07/01/2021 1324   PROTEINUR >=300 (A) 07/01/2021 1324   UROBILINOGEN 0.2 09/11/2014 1319   NITRITE POSITIVE (A) 07/01/2021 1324   LEUKOCYTESUR LARGE (A) 07/01/2021 1324   Sepsis Labs Invalid input(s): PROCALCITONIN,  WBC,  LACTICIDVEN Microbiology Recent Results (from the past 240 hour(s))  Resp Panel by RT-PCR (Flu A&B, Covid) Nasopharyngeal Swab     Status: Abnormal   Collection Time: 07/01/21 12:32 PM   Specimen: Nasopharyngeal Swab; Nasopharyngeal(NP) swabs in vial transport medium  Result Value Ref Range Status   SARS Coronavirus 2 by RT PCR POSITIVE (A) NEGATIVE Final    Comment: RESULT CALLED TO, READ BACK BY  AND VERIFIED WITH: FARRAR,S. RN AT [id number] 07/01/21 MULLINS,T    Influenza A by PCR NEGATIVE NEGATIVE Final   Influenza B by PCR NEGATIVE NEGATIVE Final    Comment: Performed at [facility], [place] [address] [place], [place] [address]     Time coordinating discharge: Over 30 minutes  SIGNED:   Little Ishikawa, DO [facility] 07/04/2021, 11:02 AM Pager   If 7PM-7AM, please contact night-coverage [email or web address]

## 2021-07-04 LAB — GLUCOSE, CAPILLARY
Glucose-Capillary: 97 mg/dL (ref 70–99)
Glucose-Capillary: 124 mg/dL — ABNORMAL HIGH (ref 70–99)
Glucose-Capillary: 236 mg/dL — ABNORMAL HIGH (ref 70–99)
Glucose-Capillary: 119 mg/dL — ABNORMAL HIGH (ref 70–99)
Glucose-Capillary: 147 mg/dL — ABNORMAL HIGH (ref 70–99)
Glucose-Capillary: 110 mg/dL — ABNORMAL HIGH (ref 70–99)

## 2021-07-04 MED ORDER — METOPROLOL SUCCINATE ER 50 MG PO TB24: 50.0000 mg | ORAL_TABLET | Freq: Every day | ORAL | 0 refills | Status: DC

## 2021-07-04 NOTE — Progress Notes (Signed)
Patient refusing AM labs today.

## 2021-07-04 NOTE — Progress Notes (Signed)
Report given to EMS transport at bedside for d/c back to SNF. This Rn attempted to contact [place] at [phone] and was sent to voicemail multiple times with no response. Pt discharge in stable condition, discharge instructions and transport packet given to EMS transport staff. Pt denies further questions, Ems transports denies questions. Pt discharge from unit.

## 2021-07-04 NOTE — Plan of Care (Signed)
Patient discharged.

## 2021-07-04 NOTE — Consult Note (Signed)
[facility] Inpatient Consult   07/04/2021  ANGELINAH NICKSON 01/12/1966 [id number]  Patient screened for hospitalization with noted extreme high risk score for unplanned readmission risk to assess for potential [facility] service needs for post hospital transition.  Review of patient's medical record reveals patient is plan is to return to SNF.  No [facility] CM needs.  Netta Cedars, MSN, [facility] Liaison Nurse Mobile Phone [phone]  Toll free office [phone]
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
## 2021-07-04 NOTE — Care Management Important Message (Signed)
Important Message  Patient Details IM Letter given to the Patient. Name: XIMENNA BUYERS MRN: [id number] Date of Birth: 1966/02/06   Medicare Important Message Given:  Yes     Kerin Salen 07/04/2021, 11:25 AM

## 2021-07-04 NOTE — Plan of Care (Signed)
  Problem: Health Behavior/Discharge Planning: Goal: Ability to manage health-related needs will improve Outcome: Progressing   Problem: Education: Goal: Knowledge of General Education information will improve Description: Including pain rating scale, medication(s)/side effects and non-pharmacologic comfort measures Outcome: Progressing   

## 2021-07-04 NOTE — TOC Transition Note (Signed)
Transition of Care [facility]) - CM/SW Discharge Note   Patient Details  Name: [name] MRN: [id number] Date of Birth: Oct 15, 1966  [facility] CM/SW Contact:  Dessa Phi, RN Phone Number: 07/04/2021, 12:07 PM   Clinical Narrative: patient denied P2P-informed son of appeal rights-chose to d/c back to [place] under medicaid benefit [place] aware of covid+ PTA, going to rm#[id number],nsg call report tel#[phone]. PTAR called. D/c summary sent to [facility]No further CM needs.      Final next level of care: Long Term Nursing Home Barriers to Discharge: No Barriers Identified   Patient Goals and CMS Choice Patient states their goals for this hospitalization and ongoing recovery are:: return back to SNF-[facility].gov Compare Post Acute Care list provided to:: Patient Represenative (must comment) Choice offered to / list presented to : Adult Children  Discharge Placement              Patient chooses bed at: Other - please specify in the comment section below: [facility]) Patient to be transferred to facility by: [place] Name of family member notified: [name] son [phone] Patient and family notified of of transfer: 07/04/21  Discharge Plan and Services   Discharge Planning Services: CM Consult Post Acute Care Choice: [facility]                               Social Determinants of Health (SDOH) Interventions     Readmission Risk Interventions Readmission Risk Prevention Plan 10/31/2020 08/27/2020  Transportation Screening Complete Complete  Medication Review Press photographer) Complete Complete  PCP or Specialist appointment within 3-5 days of discharge - Complete  HRI or [place] - Complete  SW Recovery Care/Counseling Consult Complete Complete  Palliative Care Screening Not Applicable Not Applicable  Skilled Nursing Facility Complete Complete  Some recent data might be hidden

## 2021-07-05 DIAGNOSIS — W19XXXA Unspecified fall, initial encounter: Secondary | ICD-10-CM | POA: Diagnosis not present

## 2021-07-05 DIAGNOSIS — Z743 Need for continuous supervision: Secondary | ICD-10-CM | POA: Diagnosis not present

## 2021-07-05 DIAGNOSIS — S3992XA Unspecified injury of lower back, initial encounter: Secondary | ICD-10-CM | POA: Diagnosis present

## 2021-07-05 DIAGNOSIS — R6889 Other general symptoms and signs: Secondary | ICD-10-CM | POA: Diagnosis not present

## 2021-07-05 DIAGNOSIS — N183 Chronic kidney disease, stage 3 unspecified: Secondary | ICD-10-CM | POA: Diagnosis not present

## 2021-07-05 DIAGNOSIS — F17210 Nicotine dependence, cigarettes, uncomplicated: Secondary | ICD-10-CM | POA: Diagnosis not present

## 2021-07-05 DIAGNOSIS — Z7901 Long term (current) use of anticoagulants: Secondary | ICD-10-CM | POA: Diagnosis not present

## 2021-07-05 DIAGNOSIS — I499 Cardiac arrhythmia, unspecified: Secondary | ICD-10-CM | POA: Diagnosis not present

## 2021-07-05 DIAGNOSIS — Z79899 Other long term (current) drug therapy: Secondary | ICD-10-CM | POA: Diagnosis not present

## 2021-07-05 DIAGNOSIS — Z8616 Personal history of COVID-19: Secondary | ICD-10-CM | POA: Diagnosis not present

## 2021-07-05 DIAGNOSIS — Z7982 Long term (current) use of aspirin: Secondary | ICD-10-CM | POA: Diagnosis not present

## 2021-07-05 DIAGNOSIS — M14679 Charcot's joint, unspecified ankle and foot: Secondary | ICD-10-CM | POA: Diagnosis not present

## 2021-07-05 DIAGNOSIS — I700 Atherosclerosis of aorta: Secondary | ICD-10-CM | POA: Diagnosis not present

## 2021-07-05 DIAGNOSIS — R404 Transient alteration of awareness: Secondary | ICD-10-CM | POA: Diagnosis not present

## 2021-07-05 DIAGNOSIS — I739 Peripheral vascular disease, unspecified: Secondary | ICD-10-CM | POA: Diagnosis not present

## 2021-07-05 DIAGNOSIS — Z794 Long term (current) use of insulin: Secondary | ICD-10-CM | POA: Diagnosis not present

## 2021-07-05 DIAGNOSIS — U071 COVID-19: Secondary | ICD-10-CM | POA: Diagnosis not present

## 2021-07-05 DIAGNOSIS — J449 Chronic obstructive pulmonary disease, unspecified: Secondary | ICD-10-CM | POA: Diagnosis not present

## 2021-07-05 DIAGNOSIS — R410 Disorientation, unspecified: Secondary | ICD-10-CM | POA: Diagnosis not present

## 2021-07-05 DIAGNOSIS — I130 Hypertensive heart and chronic kidney disease with heart failure and stage 1 through stage 4 chronic kidney disease, or unspecified chronic kidney disease: Secondary | ICD-10-CM | POA: Diagnosis not present

## 2021-07-05 DIAGNOSIS — Z043 Encounter for examination and observation following other accident: Secondary | ICD-10-CM | POA: Diagnosis not present

## 2021-07-05 DIAGNOSIS — F0390 Unspecified dementia without behavioral disturbance: Secondary | ICD-10-CM | POA: Diagnosis not present

## 2021-07-05 DIAGNOSIS — I480 Paroxysmal atrial fibrillation: Secondary | ICD-10-CM | POA: Diagnosis not present

## 2021-07-05 DIAGNOSIS — S39012A Strain of muscle, fascia and tendon of lower back, initial encounter: Secondary | ICD-10-CM | POA: Diagnosis not present

## 2021-07-05 DIAGNOSIS — I5042 Chronic combined systolic (congestive) and diastolic (congestive) heart failure: Secondary | ICD-10-CM | POA: Diagnosis not present

## 2021-07-07 ENCOUNTER — Ambulatory Visit: Payer: Medicare Other | Admitting: Gastroenterology

## 2021-07-07 ENCOUNTER — Emergency Department (HOSPITAL_COMMUNITY): Payer: Medicare Other

## 2021-07-07 ENCOUNTER — Emergency Department (HOSPITAL_COMMUNITY)
Admission: EM | Admit: 2021-07-07 | Discharge: 2021-07-08 | Disposition: A | Discharge status: Skilled Nursing Facility | Payer: Medicare Other | Attending: Emergency Medicine | Admitting: Emergency Medicine

## 2021-07-07 ENCOUNTER — Other Ambulatory Visit: Payer: Self-pay

## 2021-07-07 DIAGNOSIS — J449 Chronic obstructive pulmonary disease, unspecified: Secondary | ICD-10-CM | POA: Diagnosis not present

## 2021-07-07 DIAGNOSIS — Z743 Need for continuous supervision: Secondary | ICD-10-CM | POA: Diagnosis not present

## 2021-07-07 DIAGNOSIS — Z7901 Long term (current) use of anticoagulants: Secondary | ICD-10-CM | POA: Diagnosis not present

## 2021-07-07 DIAGNOSIS — F0390 Unspecified dementia without behavioral disturbance: Secondary | ICD-10-CM | POA: Insufficient documentation

## 2021-07-07 DIAGNOSIS — I5042 Chronic combined systolic (congestive) and diastolic (congestive) heart failure: Secondary | ICD-10-CM | POA: Diagnosis not present

## 2021-07-07 DIAGNOSIS — I130 Hypertensive heart and chronic kidney disease with heart failure and stage 1 through stage 4 chronic kidney disease, or unspecified chronic kidney disease: Secondary | ICD-10-CM | POA: Diagnosis not present

## 2021-07-07 DIAGNOSIS — Z7982 Long term (current) use of aspirin: Secondary | ICD-10-CM | POA: Diagnosis not present

## 2021-07-07 DIAGNOSIS — I700 Atherosclerosis of aorta: Secondary | ICD-10-CM | POA: Diagnosis not present

## 2021-07-07 DIAGNOSIS — S39012A Strain of muscle, fascia and tendon of lower back, initial encounter: Secondary | ICD-10-CM | POA: Diagnosis not present

## 2021-07-07 DIAGNOSIS — W19XXXA Unspecified fall, initial encounter: Secondary | ICD-10-CM | POA: Diagnosis not present

## 2021-07-07 DIAGNOSIS — N183 Chronic kidney disease, stage 3 unspecified: Secondary | ICD-10-CM | POA: Insufficient documentation

## 2021-07-07 DIAGNOSIS — F17210 Nicotine dependence, cigarettes, uncomplicated: Secondary | ICD-10-CM | POA: Diagnosis not present

## 2021-07-07 DIAGNOSIS — Z79899 Other long term (current) drug therapy: Secondary | ICD-10-CM | POA: Diagnosis not present

## 2021-07-07 DIAGNOSIS — Z794 Long term (current) use of insulin: Secondary | ICD-10-CM | POA: Insufficient documentation

## 2021-07-07 DIAGNOSIS — I480 Paroxysmal atrial fibrillation: Secondary | ICD-10-CM | POA: Insufficient documentation

## 2021-07-07 DIAGNOSIS — R410 Disorientation, unspecified: Secondary | ICD-10-CM | POA: Diagnosis not present

## 2021-07-07 DIAGNOSIS — S3992XA Unspecified injury of lower back, initial encounter: Secondary | ICD-10-CM | POA: Diagnosis present

## 2021-07-07 DIAGNOSIS — R404 Transient alteration of awareness: Secondary | ICD-10-CM | POA: Diagnosis not present

## 2021-07-07 DIAGNOSIS — R6889 Other general symptoms and signs: Secondary | ICD-10-CM | POA: Diagnosis not present

## 2021-07-07 DIAGNOSIS — I499 Cardiac arrhythmia, unspecified: Secondary | ICD-10-CM | POA: Diagnosis not present

## 2021-07-07 DIAGNOSIS — Z8616 Personal history of COVID-19: Secondary | ICD-10-CM | POA: Insufficient documentation

## 2021-07-07 DIAGNOSIS — Z043 Encounter for examination and observation following other accident: Secondary | ICD-10-CM | POA: Diagnosis not present

## 2021-07-07 LAB — GLUCOSE, CAPILLARY: Glucose-Capillary: 139 mg/dL — ABNORMAL HIGH (ref 70–99)

## 2021-07-07 NOTE — ED Notes (Signed)
Pt refused blood work following CT. MD notified.

## 2021-07-07 NOTE — ED Triage Notes (Signed)
Pt bib ems from [place] at [facility] c/o fall on thinners. Pt takes Eliquis for A-fib. Pt denies hitting her head or LOC.  Pt was in a bed with no fall precautions and tumbled out the bed. Ems stated pt has hx dementia. Pt tested positive for COVID 07/01/2021.   BP 146/82 HR 90 Irregular RR 16 RA 96% CBG 126
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
## 2021-07-08 DIAGNOSIS — Z743 Need for continuous supervision: Secondary | ICD-10-CM | POA: Diagnosis not present

## 2021-07-08 DIAGNOSIS — R404 Transient alteration of awareness: Secondary | ICD-10-CM | POA: Diagnosis not present

## 2021-07-08 DIAGNOSIS — W19XXXA Unspecified fall, initial encounter: Secondary | ICD-10-CM | POA: Diagnosis not present

## 2021-07-08 DIAGNOSIS — R410 Disorientation, unspecified: Secondary | ICD-10-CM | POA: Diagnosis not present

## 2021-07-08 DIAGNOSIS — Z7401 Bed confinement status: Secondary | ICD-10-CM | POA: Diagnosis not present

## 2021-07-08 LAB — CBG MONITORING, ED: Glucose-Capillary: 80 mg/dL (ref 70–99)

## 2021-07-08 NOTE — ED Notes (Signed)
Son spoke about a bruise on pts stomach. Bruise to RLQ looks to be from Blood thinner injection site. Asked pt if that is where she gets her shots. Pt agreed. Educated pt to tell staff to switch spots at [facility].

## 2021-07-08 NOTE — Progress Notes (Signed)
CSW received a call from patients son who was upset with [facility]. Patients son stated they told him they are kicking his mother out and that he needs to pay of pocket if he wants her to continue. Patients son put [facility] on the phone which included DON, Shelton Silvas and admissions, Shirlee Limerick. [facility] stated they could not keep patient because there is no payor source and if [employer] says patient can't stay there is nothing they can do. CSW told [facility] staff that there was never an authorization through patients insurance paying for therapy. CSW did not receive a response back from the [facility] staff members. [facility] then told patients son they were calling to plan discharge with him. [facility] stated they asked him if he wanted long term care or discharge home last week. Patients son chose to bring his mother home. [facility] stated they can setup DME and home health for him. CSW interrupted and stated that patient will have to discharge back to them and they will need to figure out a discharge plan from their facility. CSW was told that was fine to send patient back. CSW advised patients son to follow-up with [place] on the home health agency.

## 2021-07-08 NOTE — ED Notes (Signed)
Report attempted to [facility] again. No success.

## 2021-07-08 NOTE — Progress Notes (Signed)
CSW Ship broker and RN for [facility] to be called again.

## 2021-07-08 NOTE — ED Notes (Signed)
Son updated on plan of care to DC pt back to facility. Pt given sandwich and drink.

## 2021-07-08 NOTE — ED Provider Notes (Signed)
I assumed care in signout to follow-up on imaging and labs.  Patient refused labs. She does have  recent admission and  those labs were reviewed. All imaging is negative. I personally examined her lower extremity wounds.  No active infection is noted.  Patient has a history of Charcot foot. Recent admission reveals she is essentially nonambulatory for PT evaluation No other signs of acute traumatic injury.  Patient is resting comfortably. Patient was recently admitted and treated for COVID-19, and is in no acute distress will defer further work-up Initial provider was unable to contact anyone at the nursing facility   Ripley Fraise, MD 07/08/21 0109

## 2021-07-08 NOTE — ED Notes (Signed)
Report attempted at [facility].

## 2021-07-08 NOTE — ED Notes (Signed)
[name] refused to accept pt, they state she needs to be re-authorized with insurance to return. RN called [name] social work who is working on it with [employer]. RN got pt a diet order since we are unsure when she will leave.

## 2021-07-08 NOTE — ED Notes (Signed)
[facility] arrives and transfers pt back to [place] at this time.

## 2021-07-08 NOTE — ED Notes (Signed)
Called [facility] to transport patient to [facility]. Patient is currently 5th on the list.

## 2021-07-08 NOTE — ED Notes (Signed)
Report attempted for a 2nd time at [facility].

## 2021-07-08 NOTE — Progress Notes (Signed)
CSW contacted Shirlee Limerick with admissions at [facility]. CSW stated she has never heard of patients requiring a new authorization from the emergency room. CSW explained she has never had this issue with patients going back from any other facility. Shirlee Limerick stated she wasn't sure either and brought corporate into the office. CSW was told it doesn't matter if patients are in the emergency room for 5 or 25 minutes they still need a new authorization because it comes down to where they are at midnight.CSW explained that patient came in at 8:42 PM last night and was cleared for discharge around 1:00 AM. CSW was told it doesn't matter and that admissions will be calling [place] to start the authorization. Shirlee Limerick told CSW that she would call her back.

## 2021-07-08 NOTE — Progress Notes (Signed)
CSW contacted [employer] and was on hold for almost a hour. CSW explained that she was told by [place] that [place] [facility] has a policy that patients who come into the emergency room require a new authorization before they can discharge back to their SNF. [employer] looked up patient and stated that there was never an authorization started for therapy. CSW was told the only one listed is from September 8th that was denied. Patient discharged from [facility] on September 9th to [facility] under [place]. CSW was also told if patient was there for 4 or 5 hours that she would NOT need a new authorization. CSW was told if patient was there for OVER 24 hours then she might need a new authorization. CSW was told when patients are admitted into the hospital then they require a new authorization. Patient was NOT admitted. [employer] stated there is no authorization for therapy for patient at this time. CSW was told if patient was there for living purposes then CSW just needs to call the facility to discharge her back. CSW stated she did that this morning and that's when they stated a new Auth was needed. [employer] advised CSW to follow back up with the facility.

## 2021-07-08 NOTE — ED Notes (Addendum)
No update on [place] authorization per [employer] case Freight forwarder. Pt resting in bed, given food and snack, call light in reach, denies pain, no distress noted.

## 2021-07-09 DIAGNOSIS — R579 Shock, unspecified: Secondary | ICD-10-CM | POA: Diagnosis not present

## 2021-07-09 DIAGNOSIS — N390 Urinary tract infection, site not specified: Secondary | ICD-10-CM | POA: Diagnosis not present

## 2021-07-09 DIAGNOSIS — G8929 Other chronic pain: Secondary | ICD-10-CM | POA: Diagnosis not present

## 2021-07-09 DIAGNOSIS — W19XXXA Unspecified fall, initial encounter: Secondary | ICD-10-CM | POA: Diagnosis not present

## 2021-07-09 DIAGNOSIS — R519 Headache, unspecified: Secondary | ICD-10-CM | POA: Diagnosis not present

## 2021-07-09 DIAGNOSIS — I429 Cardiomyopathy, unspecified: Secondary | ICD-10-CM | POA: Diagnosis not present

## 2021-07-09 DIAGNOSIS — I69354 Hemiplegia and hemiparesis following cerebral infarction affecting left non-dominant side: Secondary | ICD-10-CM | POA: Diagnosis not present

## 2021-07-09 DIAGNOSIS — I5042 Chronic combined systolic (congestive) and diastolic (congestive) heart failure: Secondary | ICD-10-CM | POA: Diagnosis not present

## 2021-07-09 DIAGNOSIS — R569 Unspecified convulsions: Secondary | ICD-10-CM | POA: Diagnosis not present

## 2021-07-09 DIAGNOSIS — F1210 Cannabis abuse, uncomplicated: Secondary | ICD-10-CM | POA: Diagnosis not present

## 2021-07-09 DIAGNOSIS — E10610 Type 1 diabetes mellitus with diabetic neuropathic arthropathy: Secondary | ICD-10-CM | POA: Diagnosis not present

## 2021-07-09 DIAGNOSIS — G9341 Metabolic encephalopathy: Secondary | ICD-10-CM | POA: Diagnosis not present

## 2021-07-09 DIAGNOSIS — Z8616 Personal history of COVID-19: Secondary | ICD-10-CM | POA: Diagnosis not present

## 2021-07-09 DIAGNOSIS — D649 Anemia, unspecified: Secondary | ICD-10-CM | POA: Diagnosis not present

## 2021-07-09 DIAGNOSIS — L89890 Pressure ulcer of other site, unstageable: Secondary | ICD-10-CM | POA: Diagnosis not present

## 2021-07-09 DIAGNOSIS — D638 Anemia in other chronic diseases classified elsewhere: Secondary | ICD-10-CM | POA: Diagnosis not present

## 2021-07-09 DIAGNOSIS — I130 Hypertensive heart and chronic kidney disease with heart failure and stage 1 through stage 4 chronic kidney disease, or unspecified chronic kidney disease: Secondary | ICD-10-CM | POA: Diagnosis not present

## 2021-07-09 DIAGNOSIS — E43 Unspecified severe protein-calorie malnutrition: Secondary | ICD-10-CM | POA: Diagnosis not present

## 2021-07-09 DIAGNOSIS — I5030 Unspecified diastolic (congestive) heart failure: Secondary | ICD-10-CM | POA: Diagnosis not present

## 2021-07-09 DIAGNOSIS — I739 Peripheral vascular disease, unspecified: Secondary | ICD-10-CM | POA: Diagnosis not present

## 2021-07-09 DIAGNOSIS — J449 Chronic obstructive pulmonary disease, unspecified: Secondary | ICD-10-CM | POA: Diagnosis not present

## 2021-07-09 DIAGNOSIS — N1831 Chronic kidney disease, stage 3a: Secondary | ICD-10-CM | POA: Diagnosis not present

## 2021-07-09 DIAGNOSIS — S0993XA Unspecified injury of face, initial encounter: Secondary | ICD-10-CM | POA: Diagnosis not present

## 2021-07-09 DIAGNOSIS — I10 Essential (primary) hypertension: Secondary | ICD-10-CM | POA: Diagnosis not present

## 2021-07-09 DIAGNOSIS — G4701 Insomnia due to medical condition: Secondary | ICD-10-CM | POA: Diagnosis not present

## 2021-07-09 DIAGNOSIS — I700 Atherosclerosis of aorta: Secondary | ICD-10-CM | POA: Diagnosis not present

## 2021-07-09 DIAGNOSIS — E1051 Type 1 diabetes mellitus with diabetic peripheral angiopathy without gangrene: Secondary | ICD-10-CM | POA: Diagnosis not present

## 2021-07-09 DIAGNOSIS — J9621 Acute and chronic respiratory failure with hypoxia: Secondary | ICD-10-CM | POA: Diagnosis not present

## 2021-07-09 DIAGNOSIS — R092 Respiratory arrest: Secondary | ICD-10-CM | POA: Diagnosis present

## 2021-07-09 DIAGNOSIS — E669 Obesity, unspecified: Secondary | ICD-10-CM | POA: Diagnosis not present

## 2021-07-09 DIAGNOSIS — J9691 Respiratory failure, unspecified with hypoxia: Secondary | ICD-10-CM | POA: Diagnosis not present

## 2021-07-09 DIAGNOSIS — I96 Gangrene, not elsewhere classified: Secondary | ICD-10-CM | POA: Diagnosis not present

## 2021-07-09 DIAGNOSIS — E10649 Type 1 diabetes mellitus with hypoglycemia without coma: Secondary | ICD-10-CM | POA: Diagnosis not present

## 2021-07-09 DIAGNOSIS — L89152 Pressure ulcer of sacral region, stage 2: Secondary | ICD-10-CM | POA: Diagnosis not present

## 2021-07-09 DIAGNOSIS — S0285XA Fracture of orbit, unspecified, initial encounter for closed fracture: Secondary | ICD-10-CM | POA: Diagnosis not present

## 2021-07-09 DIAGNOSIS — E1151 Type 2 diabetes mellitus with diabetic peripheral angiopathy without gangrene: Secondary | ICD-10-CM | POA: Diagnosis not present

## 2021-07-09 DIAGNOSIS — I70209 Unspecified atherosclerosis of native arteries of extremities, unspecified extremity: Secondary | ICD-10-CM | POA: Diagnosis not present

## 2021-07-09 DIAGNOSIS — F1410 Cocaine abuse, uncomplicated: Secondary | ICD-10-CM | POA: Diagnosis not present

## 2021-07-09 DIAGNOSIS — U071 COVID-19: Secondary | ICD-10-CM | POA: Diagnosis not present

## 2021-07-09 DIAGNOSIS — E1022 Type 1 diabetes mellitus with diabetic chronic kidney disease: Secondary | ICD-10-CM | POA: Diagnosis not present

## 2021-07-10 DIAGNOSIS — E1151 Type 2 diabetes mellitus with diabetic peripheral angiopathy without gangrene: Secondary | ICD-10-CM | POA: Diagnosis not present

## 2021-07-10 DIAGNOSIS — I10 Essential (primary) hypertension: Secondary | ICD-10-CM | POA: Diagnosis not present

## 2021-07-10 DIAGNOSIS — I5030 Unspecified diastolic (congestive) heart failure: Secondary | ICD-10-CM | POA: Diagnosis not present

## 2021-07-10 DIAGNOSIS — U071 COVID-19: Secondary | ICD-10-CM | POA: Diagnosis not present

## 2021-07-10 DIAGNOSIS — I739 Peripheral vascular disease, unspecified: Secondary | ICD-10-CM | POA: Diagnosis not present

## 2021-07-10 DIAGNOSIS — N390 Urinary tract infection, site not specified: Secondary | ICD-10-CM | POA: Diagnosis not present

## 2021-07-11 DIAGNOSIS — I10 Essential (primary) hypertension: Secondary | ICD-10-CM | POA: Diagnosis not present

## 2021-07-11 DIAGNOSIS — J9691 Respiratory failure, unspecified with hypoxia: Secondary | ICD-10-CM | POA: Diagnosis not present

## 2021-07-11 DIAGNOSIS — I70209 Unspecified atherosclerosis of native arteries of extremities, unspecified extremity: Secondary | ICD-10-CM | POA: Diagnosis not present

## 2021-07-11 DIAGNOSIS — E1151 Type 2 diabetes mellitus with diabetic peripheral angiopathy without gangrene: Secondary | ICD-10-CM | POA: Diagnosis not present

## 2021-07-11 DIAGNOSIS — I96 Gangrene, not elsewhere classified: Secondary | ICD-10-CM | POA: Diagnosis not present

## 2021-07-11 DIAGNOSIS — I5030 Unspecified diastolic (congestive) heart failure: Secondary | ICD-10-CM | POA: Diagnosis not present

## 2021-07-11 DIAGNOSIS — I739 Peripheral vascular disease, unspecified: Secondary | ICD-10-CM | POA: Diagnosis not present

## 2021-07-11 DIAGNOSIS — D638 Anemia in other chronic diseases classified elsewhere: Secondary | ICD-10-CM | POA: Diagnosis not present

## 2021-07-14 DIAGNOSIS — G4701 Insomnia due to medical condition: Secondary | ICD-10-CM | POA: Diagnosis not present

## 2021-07-16 ENCOUNTER — Emergency Department (HOSPITAL_COMMUNITY)
Admission: EM | Admit: 2021-07-16 | Discharge: 2021-07-17 | Disposition: A | Discharge status: Home / Self Care | Payer: Medicare Other | Source: Home / Self Care | Attending: Student | Admitting: Student

## 2021-07-16 ENCOUNTER — Encounter (HOSPITAL_COMMUNITY): Payer: Self-pay | Admitting: Emergency Medicine

## 2021-07-16 ENCOUNTER — Emergency Department (HOSPITAL_COMMUNITY): Payer: Medicare Other

## 2021-07-16 DIAGNOSIS — S0285XA Fracture of orbit, unspecified, initial encounter for closed fracture: Secondary | ICD-10-CM | POA: Diagnosis not present

## 2021-07-16 DIAGNOSIS — R6889 Other general symptoms and signs: Secondary | ICD-10-CM | POA: Diagnosis not present

## 2021-07-16 DIAGNOSIS — J449 Chronic obstructive pulmonary disease, unspecified: Secondary | ICD-10-CM | POA: Insufficient documentation

## 2021-07-16 DIAGNOSIS — Z7901 Long term (current) use of anticoagulants: Secondary | ICD-10-CM | POA: Insufficient documentation

## 2021-07-16 DIAGNOSIS — Z794 Long term (current) use of insulin: Secondary | ICD-10-CM | POA: Insufficient documentation

## 2021-07-16 DIAGNOSIS — E669 Obesity, unspecified: Secondary | ICD-10-CM | POA: Diagnosis present

## 2021-07-16 DIAGNOSIS — I130 Hypertensive heart and chronic kidney disease with heart failure and stage 1 through stage 4 chronic kidney disease, or unspecified chronic kidney disease: Secondary | ICD-10-CM | POA: Insufficient documentation

## 2021-07-16 DIAGNOSIS — I429 Cardiomyopathy, unspecified: Secondary | ICD-10-CM | POA: Diagnosis not present

## 2021-07-16 DIAGNOSIS — E10649 Type 1 diabetes mellitus with hypoglycemia without coma: Secondary | ICD-10-CM | POA: Diagnosis not present

## 2021-07-16 DIAGNOSIS — F17210 Nicotine dependence, cigarettes, uncomplicated: Secondary | ICD-10-CM | POA: Insufficient documentation

## 2021-07-16 DIAGNOSIS — R569 Unspecified convulsions: Secondary | ICD-10-CM | POA: Diagnosis not present

## 2021-07-16 DIAGNOSIS — M6281 Muscle weakness (generalized): Secondary | ICD-10-CM | POA: Diagnosis not present

## 2021-07-16 DIAGNOSIS — W1839XA Other fall on same level, initial encounter: Secondary | ICD-10-CM | POA: Insufficient documentation

## 2021-07-16 DIAGNOSIS — E1122 Type 2 diabetes mellitus with diabetic chronic kidney disease: Secondary | ICD-10-CM | POA: Insufficient documentation

## 2021-07-16 DIAGNOSIS — I69354 Hemiplegia and hemiparesis following cerebral infarction affecting left non-dominant side: Secondary | ICD-10-CM | POA: Diagnosis not present

## 2021-07-16 DIAGNOSIS — Z7982 Long term (current) use of aspirin: Secondary | ICD-10-CM | POA: Insufficient documentation

## 2021-07-16 DIAGNOSIS — R269 Unspecified abnormalities of gait and mobility: Secondary | ICD-10-CM | POA: Diagnosis not present

## 2021-07-16 DIAGNOSIS — Z8616 Personal history of COVID-19: Secondary | ICD-10-CM | POA: Insufficient documentation

## 2021-07-16 DIAGNOSIS — N183 Chronic kidney disease, stage 3 unspecified: Secondary | ICD-10-CM | POA: Insufficient documentation

## 2021-07-16 DIAGNOSIS — E10610 Type 1 diabetes mellitus with diabetic neuropathic arthropathy: Secondary | ICD-10-CM | POA: Diagnosis not present

## 2021-07-16 DIAGNOSIS — N1831 Chronic kidney disease, stage 3a: Secondary | ICD-10-CM | POA: Diagnosis not present

## 2021-07-16 DIAGNOSIS — I2510 Atherosclerotic heart disease of native coronary artery without angina pectoris: Secondary | ICD-10-CM | POA: Insufficient documentation

## 2021-07-16 DIAGNOSIS — I5042 Chronic combined systolic (congestive) and diastolic (congestive) heart failure: Secondary | ICD-10-CM | POA: Insufficient documentation

## 2021-07-16 DIAGNOSIS — G9341 Metabolic encephalopathy: Secondary | ICD-10-CM | POA: Diagnosis not present

## 2021-07-16 DIAGNOSIS — R519 Headache, unspecified: Secondary | ICD-10-CM | POA: Diagnosis not present

## 2021-07-16 DIAGNOSIS — G8929 Other chronic pain: Secondary | ICD-10-CM | POA: Diagnosis not present

## 2021-07-16 DIAGNOSIS — I739 Peripheral vascular disease, unspecified: Secondary | ICD-10-CM | POA: Diagnosis not present

## 2021-07-16 DIAGNOSIS — Z79899 Other long term (current) drug therapy: Secondary | ICD-10-CM | POA: Insufficient documentation

## 2021-07-16 DIAGNOSIS — N390 Urinary tract infection, site not specified: Secondary | ICD-10-CM | POA: Diagnosis not present

## 2021-07-16 DIAGNOSIS — I700 Atherosclerosis of aorta: Secondary | ICD-10-CM | POA: Diagnosis not present

## 2021-07-16 DIAGNOSIS — F1210 Cannabis abuse, uncomplicated: Secondary | ICD-10-CM | POA: Diagnosis present

## 2021-07-16 DIAGNOSIS — I4891 Unspecified atrial fibrillation: Secondary | ICD-10-CM | POA: Insufficient documentation

## 2021-07-16 DIAGNOSIS — E1022 Type 1 diabetes mellitus with diabetic chronic kidney disease: Secondary | ICD-10-CM | POA: Diagnosis not present

## 2021-07-16 DIAGNOSIS — E43 Unspecified severe protein-calorie malnutrition: Secondary | ICD-10-CM | POA: Diagnosis not present

## 2021-07-16 DIAGNOSIS — Z743 Need for continuous supervision: Secondary | ICD-10-CM | POA: Diagnosis not present

## 2021-07-16 DIAGNOSIS — R092 Respiratory arrest: Secondary | ICD-10-CM | POA: Diagnosis present

## 2021-07-16 DIAGNOSIS — F1410 Cocaine abuse, uncomplicated: Secondary | ICD-10-CM | POA: Diagnosis present

## 2021-07-16 DIAGNOSIS — R579 Shock, unspecified: Secondary | ICD-10-CM | POA: Diagnosis not present

## 2021-07-16 DIAGNOSIS — L89890 Pressure ulcer of other site, unstageable: Secondary | ICD-10-CM | POA: Diagnosis not present

## 2021-07-16 DIAGNOSIS — S0231XA Fracture of orbital floor, right side, initial encounter for closed fracture: Secondary | ICD-10-CM | POA: Insufficient documentation

## 2021-07-16 DIAGNOSIS — S0993XA Unspecified injury of face, initial encounter: Secondary | ICD-10-CM | POA: Diagnosis not present

## 2021-07-16 DIAGNOSIS — D649 Anemia, unspecified: Secondary | ICD-10-CM | POA: Diagnosis not present

## 2021-07-16 DIAGNOSIS — W19XXXA Unspecified fall, initial encounter: Secondary | ICD-10-CM | POA: Diagnosis not present

## 2021-07-16 DIAGNOSIS — J441 Chronic obstructive pulmonary disease with (acute) exacerbation: Secondary | ICD-10-CM | POA: Diagnosis not present

## 2021-07-16 DIAGNOSIS — E1051 Type 1 diabetes mellitus with diabetic peripheral angiopathy without gangrene: Secondary | ICD-10-CM | POA: Diagnosis not present

## 2021-07-16 DIAGNOSIS — L89152 Pressure ulcer of sacral region, stage 2: Secondary | ICD-10-CM | POA: Diagnosis not present

## 2021-07-16 DIAGNOSIS — R404 Transient alteration of awareness: Secondary | ICD-10-CM | POA: Diagnosis not present

## 2021-07-16 DIAGNOSIS — I639 Cerebral infarction, unspecified: Secondary | ICD-10-CM | POA: Diagnosis not present

## 2021-07-16 DIAGNOSIS — J9621 Acute and chronic respiratory failure with hypoxia: Secondary | ICD-10-CM | POA: Diagnosis not present

## 2021-07-16 MED ORDER — ACETAMINOPHEN 325 MG PO TABS
650.0000 mg | ORAL_TABLET | Freq: Once | ORAL | Status: AC
  Administered 2021-07-16: 650 mg via ORAL
  Filled 2021-07-16: qty 2

## 2021-07-16 NOTE — ED Triage Notes (Signed)
Pt from [facility] c/o headache and facial swelling. Hx of fall on 9/12.

## 2021-07-16 NOTE — ED Notes (Signed)
[facility] called for pt transfer. Pt is in 8th in line for transport
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
## 2021-07-16 NOTE — ED Notes (Signed)
Pt requesting her glasses and dentures for the second time. Pt made aware that none of her belongings are here and they might be at [facility]. Pt confused and states that she's not from there and has nowhere to go. Pt assured that she's from [place] and [facility] will be called to come pick her up since she's been d/c

## 2021-07-16 NOTE — ED Notes (Signed)
Spoke to pt's son to let him give him pt update and let him know pt was going to be d/c. All questions answered and son aware that pt has to f/u with ENT for her orbital fx in 1 week. Attempted to call [facility] to give report but no answer.

## 2021-07-17 DIAGNOSIS — I429 Cardiomyopathy, unspecified: Secondary | ICD-10-CM | POA: Diagnosis not present

## 2021-07-17 DIAGNOSIS — J449 Chronic obstructive pulmonary disease, unspecified: Secondary | ICD-10-CM | POA: Diagnosis not present

## 2021-07-17 DIAGNOSIS — R092 Respiratory arrest: Secondary | ICD-10-CM | POA: Diagnosis not present

## 2021-07-17 DIAGNOSIS — N390 Urinary tract infection, site not specified: Secondary | ICD-10-CM | POA: Diagnosis not present

## 2021-07-17 DIAGNOSIS — I130 Hypertensive heart and chronic kidney disease with heart failure and stage 1 through stage 4 chronic kidney disease, or unspecified chronic kidney disease: Secondary | ICD-10-CM | POA: Diagnosis not present

## 2021-07-17 DIAGNOSIS — N1831 Chronic kidney disease, stage 3a: Secondary | ICD-10-CM | POA: Diagnosis not present

## 2021-07-17 DIAGNOSIS — Z7189 Other specified counseling: Secondary | ICD-10-CM | POA: Diagnosis not present

## 2021-07-17 DIAGNOSIS — R918 Other nonspecific abnormal finding of lung field: Secondary | ICD-10-CM | POA: Diagnosis not present

## 2021-07-17 DIAGNOSIS — E1022 Type 1 diabetes mellitus with diabetic chronic kidney disease: Secondary | ICD-10-CM | POA: Diagnosis not present

## 2021-07-17 DIAGNOSIS — I10 Essential (primary) hypertension: Secondary | ICD-10-CM | POA: Diagnosis not present

## 2021-07-17 DIAGNOSIS — J441 Chronic obstructive pulmonary disease with (acute) exacerbation: Secondary | ICD-10-CM | POA: Diagnosis not present

## 2021-07-17 DIAGNOSIS — R6889 Other general symptoms and signs: Secondary | ICD-10-CM | POA: Diagnosis not present

## 2021-07-17 DIAGNOSIS — Z515 Encounter for palliative care: Secondary | ICD-10-CM | POA: Diagnosis not present

## 2021-07-17 DIAGNOSIS — E43 Unspecified severe protein-calorie malnutrition: Secondary | ICD-10-CM | POA: Diagnosis not present

## 2021-07-17 DIAGNOSIS — Z743 Need for continuous supervision: Secondary | ICD-10-CM | POA: Diagnosis not present

## 2021-07-17 DIAGNOSIS — I5042 Chronic combined systolic (congestive) and diastolic (congestive) heart failure: Secondary | ICD-10-CM | POA: Diagnosis not present

## 2021-07-17 DIAGNOSIS — G319 Degenerative disease of nervous system, unspecified: Secondary | ICD-10-CM | POA: Diagnosis not present

## 2021-07-17 DIAGNOSIS — R569 Unspecified convulsions: Secondary | ICD-10-CM | POA: Diagnosis not present

## 2021-07-17 DIAGNOSIS — I639 Cerebral infarction, unspecified: Secondary | ICD-10-CM | POA: Diagnosis not present

## 2021-07-17 DIAGNOSIS — R404 Transient alteration of awareness: Secondary | ICD-10-CM | POA: Diagnosis not present

## 2021-07-17 DIAGNOSIS — J9621 Acute and chronic respiratory failure with hypoxia: Secondary | ICD-10-CM | POA: Diagnosis not present

## 2021-07-17 DIAGNOSIS — N3000 Acute cystitis without hematuria: Secondary | ICD-10-CM | POA: Diagnosis not present

## 2021-07-17 DIAGNOSIS — R579 Shock, unspecified: Secondary | ICD-10-CM | POA: Diagnosis not present

## 2021-07-17 DIAGNOSIS — I6782 Cerebral ischemia: Secondary | ICD-10-CM | POA: Diagnosis not present

## 2021-07-17 DIAGNOSIS — Z8616 Personal history of COVID-19: Secondary | ICD-10-CM | POA: Diagnosis not present

## 2021-07-17 DIAGNOSIS — E0859 Diabetes mellitus due to underlying condition with other circulatory complications: Secondary | ICD-10-CM | POA: Diagnosis not present

## 2021-07-17 DIAGNOSIS — E162 Hypoglycemia, unspecified: Secondary | ICD-10-CM | POA: Diagnosis not present

## 2021-07-17 DIAGNOSIS — F1210 Cannabis abuse, uncomplicated: Secondary | ICD-10-CM | POA: Diagnosis present

## 2021-07-17 DIAGNOSIS — E10649 Type 1 diabetes mellitus with hypoglycemia without coma: Secondary | ICD-10-CM | POA: Diagnosis not present

## 2021-07-17 DIAGNOSIS — I700 Atherosclerosis of aorta: Secondary | ICD-10-CM | POA: Diagnosis not present

## 2021-07-17 DIAGNOSIS — Z4682 Encounter for fitting and adjustment of non-vascular catheter: Secondary | ICD-10-CM | POA: Diagnosis not present

## 2021-07-17 DIAGNOSIS — R269 Unspecified abnormalities of gait and mobility: Secondary | ICD-10-CM | POA: Diagnosis not present

## 2021-07-17 DIAGNOSIS — E10610 Type 1 diabetes mellitus with diabetic neuropathic arthropathy: Secondary | ICD-10-CM | POA: Diagnosis not present

## 2021-07-17 DIAGNOSIS — F1410 Cocaine abuse, uncomplicated: Secondary | ICD-10-CM | POA: Diagnosis present

## 2021-07-17 DIAGNOSIS — D649 Anemia, unspecified: Secondary | ICD-10-CM | POA: Diagnosis not present

## 2021-07-17 DIAGNOSIS — E876 Hypokalemia: Secondary | ICD-10-CM | POA: Diagnosis not present

## 2021-07-17 DIAGNOSIS — R410 Disorientation, unspecified: Secondary | ICD-10-CM | POA: Diagnosis not present

## 2021-07-17 DIAGNOSIS — E11649 Type 2 diabetes mellitus with hypoglycemia without coma: Secondary | ICD-10-CM | POA: Diagnosis not present

## 2021-07-17 DIAGNOSIS — M6281 Muscle weakness (generalized): Secondary | ICD-10-CM | POA: Diagnosis not present

## 2021-07-17 DIAGNOSIS — S81802D Unspecified open wound, left lower leg, subsequent encounter: Secondary | ICD-10-CM | POA: Diagnosis not present

## 2021-07-17 DIAGNOSIS — E1051 Type 1 diabetes mellitus with diabetic peripheral angiopathy without gangrene: Secondary | ICD-10-CM | POA: Diagnosis not present

## 2021-07-17 DIAGNOSIS — R0603 Acute respiratory distress: Secondary | ICD-10-CM | POA: Diagnosis not present

## 2021-07-17 DIAGNOSIS — L89152 Pressure ulcer of sacral region, stage 2: Secondary | ICD-10-CM | POA: Diagnosis not present

## 2021-07-17 DIAGNOSIS — I69354 Hemiplegia and hemiparesis following cerebral infarction affecting left non-dominant side: Secondary | ICD-10-CM | POA: Diagnosis not present

## 2021-07-17 DIAGNOSIS — I499 Cardiac arrhythmia, unspecified: Secondary | ICD-10-CM | POA: Diagnosis not present

## 2021-07-17 DIAGNOSIS — L89890 Pressure ulcer of other site, unstageable: Secondary | ICD-10-CM | POA: Diagnosis not present

## 2021-07-17 DIAGNOSIS — G9341 Metabolic encephalopathy: Secondary | ICD-10-CM | POA: Diagnosis not present

## 2021-07-17 DIAGNOSIS — D329 Benign neoplasm of meninges, unspecified: Secondary | ICD-10-CM | POA: Diagnosis not present

## 2021-07-17 DIAGNOSIS — E669 Obesity, unspecified: Secondary | ICD-10-CM | POA: Diagnosis present

## 2021-07-17 DIAGNOSIS — S81801D Unspecified open wound, right lower leg, subsequent encounter: Secondary | ICD-10-CM | POA: Diagnosis not present

## 2021-07-17 DIAGNOSIS — W19XXXA Unspecified fall, initial encounter: Secondary | ICD-10-CM | POA: Diagnosis not present

## 2021-07-17 DIAGNOSIS — I739 Peripheral vascular disease, unspecified: Secondary | ICD-10-CM | POA: Diagnosis not present

## 2021-07-17 DIAGNOSIS — R001 Bradycardia, unspecified: Secondary | ICD-10-CM | POA: Diagnosis not present

## 2021-07-17 NOTE — ED Notes (Signed)
[facility] here to pick up pt.. 

## 2021-07-18 DIAGNOSIS — M6281 Muscle weakness (generalized): Secondary | ICD-10-CM | POA: Diagnosis not present

## 2021-07-18 DIAGNOSIS — I639 Cerebral infarction, unspecified: Secondary | ICD-10-CM | POA: Diagnosis not present

## 2021-07-18 DIAGNOSIS — J441 Chronic obstructive pulmonary disease with (acute) exacerbation: Secondary | ICD-10-CM | POA: Diagnosis not present

## 2021-07-18 DIAGNOSIS — R269 Unspecified abnormalities of gait and mobility: Secondary | ICD-10-CM | POA: Diagnosis not present

## 2021-07-20 ENCOUNTER — Emergency Department (HOSPITAL_COMMUNITY): Payer: Medicare Other

## 2021-07-20 ENCOUNTER — Inpatient Hospital Stay (HOSPITAL_COMMUNITY)
Admission: EM | Admit: 2021-07-20 | Discharge: 2021-08-02 | DRG: 208 | Disposition: A | Discharge status: Skilled Nursing Facility | Payer: Medicare Other | Source: Skilled Nursing Facility | Attending: Internal Medicine | Admitting: Internal Medicine

## 2021-07-20 ENCOUNTER — Other Ambulatory Visit: Payer: Self-pay

## 2021-07-20 DIAGNOSIS — R569 Unspecified convulsions: Secondary | ICD-10-CM | POA: Diagnosis present

## 2021-07-20 DIAGNOSIS — J449 Chronic obstructive pulmonary disease, unspecified: Secondary | ICD-10-CM | POA: Diagnosis not present

## 2021-07-20 DIAGNOSIS — R579 Shock, unspecified: Secondary | ICD-10-CM | POA: Diagnosis not present

## 2021-07-20 DIAGNOSIS — L89152 Pressure ulcer of sacral region, stage 2: Secondary | ICD-10-CM | POA: Diagnosis not present

## 2021-07-20 DIAGNOSIS — Z79899 Other long term (current) drug therapy: Secondary | ICD-10-CM

## 2021-07-20 DIAGNOSIS — F1410 Cocaine abuse, uncomplicated: Secondary | ICD-10-CM | POA: Diagnosis present

## 2021-07-20 DIAGNOSIS — F1210 Cannabis abuse, uncomplicated: Secondary | ICD-10-CM | POA: Diagnosis present

## 2021-07-20 DIAGNOSIS — R404 Transient alteration of awareness: Secondary | ICD-10-CM | POA: Diagnosis not present

## 2021-07-20 DIAGNOSIS — I10 Essential (primary) hypertension: Secondary | ICD-10-CM | POA: Diagnosis not present

## 2021-07-20 DIAGNOSIS — Z515 Encounter for palliative care: Secondary | ICD-10-CM | POA: Diagnosis not present

## 2021-07-20 DIAGNOSIS — E876 Hypokalemia: Secondary | ICD-10-CM

## 2021-07-20 DIAGNOSIS — E669 Obesity, unspecified: Secondary | ICD-10-CM | POA: Diagnosis present

## 2021-07-20 DIAGNOSIS — G9341 Metabolic encephalopathy: Secondary | ICD-10-CM

## 2021-07-20 DIAGNOSIS — N390 Urinary tract infection, site not specified: Secondary | ICD-10-CM | POA: Diagnosis present

## 2021-07-20 DIAGNOSIS — I480 Paroxysmal atrial fibrillation: Secondary | ICD-10-CM | POA: Diagnosis present

## 2021-07-20 DIAGNOSIS — Z743 Need for continuous supervision: Secondary | ICD-10-CM | POA: Diagnosis not present

## 2021-07-20 DIAGNOSIS — I130 Hypertensive heart and chronic kidney disease with heart failure and stage 1 through stage 4 chronic kidney disease, or unspecified chronic kidney disease: Secondary | ICD-10-CM | POA: Diagnosis present

## 2021-07-20 DIAGNOSIS — Z825 Family history of asthma and other chronic lower respiratory diseases: Secondary | ICD-10-CM

## 2021-07-20 DIAGNOSIS — E10649 Type 1 diabetes mellitus with hypoglycemia without coma: Secondary | ICD-10-CM | POA: Diagnosis present

## 2021-07-20 DIAGNOSIS — R001 Bradycardia, unspecified: Secondary | ICD-10-CM

## 2021-07-20 DIAGNOSIS — B952 Enterococcus as the cause of diseases classified elsewhere: Secondary | ICD-10-CM | POA: Diagnosis present

## 2021-07-20 DIAGNOSIS — Z8616 Personal history of COVID-19: Secondary | ICD-10-CM | POA: Diagnosis not present

## 2021-07-20 DIAGNOSIS — N1831 Chronic kidney disease, stage 3a: Secondary | ICD-10-CM | POA: Diagnosis not present

## 2021-07-20 DIAGNOSIS — K3189 Other diseases of stomach and duodenum: Secondary | ICD-10-CM | POA: Diagnosis not present

## 2021-07-20 DIAGNOSIS — R6889 Other general symptoms and signs: Secondary | ICD-10-CM | POA: Diagnosis not present

## 2021-07-20 DIAGNOSIS — I700 Atherosclerosis of aorta: Secondary | ICD-10-CM | POA: Diagnosis present

## 2021-07-20 DIAGNOSIS — R092 Respiratory arrest: Secondary | ICD-10-CM | POA: Diagnosis not present

## 2021-07-20 DIAGNOSIS — E43 Unspecified severe protein-calorie malnutrition: Secondary | ICD-10-CM | POA: Diagnosis not present

## 2021-07-20 DIAGNOSIS — I429 Cardiomyopathy, unspecified: Secondary | ICD-10-CM | POA: Diagnosis present

## 2021-07-20 DIAGNOSIS — I69354 Hemiplegia and hemiparesis following cerebral infarction affecting left non-dominant side: Secondary | ICD-10-CM

## 2021-07-20 DIAGNOSIS — D649 Anemia, unspecified: Secondary | ICD-10-CM | POA: Diagnosis present

## 2021-07-20 DIAGNOSIS — J9621 Acute and chronic respiratory failure with hypoxia: Principal | ICD-10-CM | POA: Diagnosis present

## 2021-07-20 DIAGNOSIS — E11649 Type 2 diabetes mellitus with hypoglycemia without coma: Secondary | ICD-10-CM

## 2021-07-20 DIAGNOSIS — E162 Hypoglycemia, unspecified: Secondary | ICD-10-CM | POA: Diagnosis not present

## 2021-07-20 DIAGNOSIS — L89610 Pressure ulcer of right heel, unstageable: Secondary | ICD-10-CM | POA: Diagnosis present

## 2021-07-20 DIAGNOSIS — E1051 Type 1 diabetes mellitus with diabetic peripheral angiopathy without gangrene: Secondary | ICD-10-CM | POA: Diagnosis present

## 2021-07-20 DIAGNOSIS — Z6828 Body mass index (BMI) 28.0-28.9, adult: Secondary | ICD-10-CM

## 2021-07-20 DIAGNOSIS — D329 Benign neoplasm of meninges, unspecified: Secondary | ICD-10-CM | POA: Diagnosis not present

## 2021-07-20 DIAGNOSIS — R410 Disorientation, unspecified: Secondary | ICD-10-CM | POA: Diagnosis not present

## 2021-07-20 DIAGNOSIS — R531 Weakness: Secondary | ICD-10-CM | POA: Diagnosis not present

## 2021-07-20 DIAGNOSIS — Z781 Physical restraint status: Secondary | ICD-10-CM

## 2021-07-20 DIAGNOSIS — N3000 Acute cystitis without hematuria: Secondary | ICD-10-CM | POA: Diagnosis not present

## 2021-07-20 DIAGNOSIS — Z7982 Long term (current) use of aspirin: Secondary | ICD-10-CM

## 2021-07-20 DIAGNOSIS — Z4659 Encounter for fitting and adjustment of other gastrointestinal appliance and device: Secondary | ICD-10-CM

## 2021-07-20 DIAGNOSIS — E1022 Type 1 diabetes mellitus with diabetic chronic kidney disease: Secondary | ICD-10-CM | POA: Diagnosis not present

## 2021-07-20 DIAGNOSIS — Z7901 Long term (current) use of anticoagulants: Secondary | ICD-10-CM

## 2021-07-20 DIAGNOSIS — Z833 Family history of diabetes mellitus: Secondary | ICD-10-CM

## 2021-07-20 DIAGNOSIS — E0859 Diabetes mellitus due to underlying condition with other circulatory complications: Secondary | ICD-10-CM | POA: Diagnosis not present

## 2021-07-20 DIAGNOSIS — S81801D Unspecified open wound, right lower leg, subsequent encounter: Secondary | ICD-10-CM | POA: Diagnosis not present

## 2021-07-20 DIAGNOSIS — S81802D Unspecified open wound, left lower leg, subsequent encounter: Secondary | ICD-10-CM | POA: Diagnosis not present

## 2021-07-20 DIAGNOSIS — Z7189 Other specified counseling: Secondary | ICD-10-CM | POA: Diagnosis not present

## 2021-07-20 DIAGNOSIS — Z7951 Long term (current) use of inhaled steroids: Secondary | ICD-10-CM

## 2021-07-20 DIAGNOSIS — E10610 Type 1 diabetes mellitus with diabetic neuropathic arthropathy: Secondary | ICD-10-CM | POA: Diagnosis not present

## 2021-07-20 DIAGNOSIS — Z4682 Encounter for fitting and adjustment of non-vascular catheter: Secondary | ICD-10-CM | POA: Diagnosis not present

## 2021-07-20 DIAGNOSIS — I499 Cardiac arrhythmia, unspecified: Secondary | ICD-10-CM | POA: Diagnosis not present

## 2021-07-20 DIAGNOSIS — L89890 Pressure ulcer of other site, unstageable: Secondary | ICD-10-CM | POA: Diagnosis not present

## 2021-07-20 DIAGNOSIS — Z794 Long term (current) use of insulin: Secondary | ICD-10-CM

## 2021-07-20 DIAGNOSIS — Z8249 Family history of ischemic heart disease and other diseases of the circulatory system: Secondary | ICD-10-CM

## 2021-07-20 DIAGNOSIS — G319 Degenerative disease of nervous system, unspecified: Secondary | ICD-10-CM | POA: Diagnosis not present

## 2021-07-20 DIAGNOSIS — Z7985 Long-term (current) use of injectable non-insulin antidiabetic drugs: Secondary | ICD-10-CM

## 2021-07-20 DIAGNOSIS — R0603 Acute respiratory distress: Secondary | ICD-10-CM | POA: Diagnosis not present

## 2021-07-20 DIAGNOSIS — I6782 Cerebral ischemia: Secondary | ICD-10-CM | POA: Diagnosis not present

## 2021-07-20 DIAGNOSIS — R918 Other nonspecific abnormal finding of lung field: Secondary | ICD-10-CM | POA: Diagnosis not present

## 2021-07-20 DIAGNOSIS — I5042 Chronic combined systolic (congestive) and diastolic (congestive) heart failure: Secondary | ICD-10-CM | POA: Diagnosis not present

## 2021-07-20 DIAGNOSIS — G4733 Obstructive sleep apnea (adult) (pediatric): Secondary | ICD-10-CM | POA: Diagnosis present

## 2021-07-20 DIAGNOSIS — E785 Hyperlipidemia, unspecified: Secondary | ICD-10-CM | POA: Diagnosis present

## 2021-07-20 DIAGNOSIS — R627 Adult failure to thrive: Secondary | ICD-10-CM | POA: Diagnosis present

## 2021-07-20 DIAGNOSIS — F17210 Nicotine dependence, cigarettes, uncomplicated: Secondary | ICD-10-CM | POA: Diagnosis present

## 2021-07-20 HISTORY — DX: Acute and chronic respiratory failure with hypoxia: J96.21

## 2021-07-20 LAB — I-STAT VENOUS BLOOD GAS, ED
Acid-Base Excess: 5 mmol/L — ABNORMAL HIGH (ref 0.0–2.0)
Bicarbonate: 25.2 mmol/L (ref 20.0–28.0)
Calcium, Ion: 1.03 mmol/L — ABNORMAL LOW (ref 1.15–1.40)
HCT: 28 % — ABNORMAL LOW (ref 36.0–46.0)
Hemoglobin: 9.5 g/dL — ABNORMAL LOW (ref 12.0–15.0)
O2 Saturation: 100 %
Potassium: 2.7 mmol/L — CL (ref 3.5–5.1)
Sodium: 147 mmol/L — ABNORMAL HIGH (ref 135–145)
TCO2: 26 mmol/L (ref 22–32)
pCO2, Ven: 22.8 mmHg — ABNORMAL LOW (ref 44.0–60.0)
pH, Ven: 7.652 (ref 7.250–7.430)
pO2, Ven: 130 mmHg — ABNORMAL HIGH (ref 32.0–45.0)

## 2021-07-20 LAB — GLUCOSE, CAPILLARY
Glucose-Capillary: 118 mg/dL — ABNORMAL HIGH (ref 70–99)
Glucose-Capillary: 148 mg/dL — ABNORMAL HIGH (ref 70–99)
Glucose-Capillary: 168 mg/dL — ABNORMAL HIGH (ref 70–99)
Glucose-Capillary: 216 mg/dL — ABNORMAL HIGH (ref 70–99)
Glucose-Capillary: 240 mg/dL — ABNORMAL HIGH (ref 70–99)
Glucose-Capillary: 255 mg/dL — ABNORMAL HIGH (ref 70–99)

## 2021-07-20 LAB — COMPREHENSIVE METABOLIC PANEL
ALT: <5 U/L (ref 0–44)
ALT: 12 U/L (ref 0–44)
AST: 19 U/L (ref 15–41)
AST: 14 U/L — ABNORMAL LOW (ref 15–41)
Albumin: 1.5 g/dL — ABNORMAL LOW (ref 3.5–5.0)
Albumin: 1.5 g/dL — ABNORMAL LOW (ref 3.5–5.0)
Alkaline Phosphatase: 63 U/L (ref 38–126)
Alkaline Phosphatase: 67 U/L (ref 38–126)
Anion gap: 8 (ref 5–15)
Anion gap: 5 (ref 5–15)
BUN: 12 mg/dL (ref 6–20)
BUN: 11 mg/dL (ref 6–20)
CO2: 21 mmol/L — ABNORMAL LOW (ref 22–32)
CO2: 24 mmol/L (ref 22–32)
Calcium: 7.8 mg/dL — ABNORMAL LOW (ref 8.9–10.3)
Calcium: 7.5 mg/dL — ABNORMAL LOW (ref 8.9–10.3)
Chloride: 116 mmol/L — ABNORMAL HIGH (ref 98–111)
Chloride: 115 mmol/L — ABNORMAL HIGH (ref 98–111)
Creatinine, Ser: 1.08 mg/dL — ABNORMAL HIGH (ref 0.44–1.00)
Creatinine, Ser: 1.1 mg/dL — ABNORMAL HIGH (ref 0.44–1.00)
GFR, Estimated: 60 mL/min — ABNORMAL LOW (ref >60)
GFR, Estimated: 59 mL/min — ABNORMAL LOW (ref >60)
Glucose, Bld: 90 mg/dL (ref 70–99)
Glucose, Bld: 169 mg/dL — ABNORMAL HIGH (ref 70–99)
Potassium: 3.2 mmol/L — ABNORMAL LOW (ref 3.5–5.1)
Potassium: 2.1 mmol/L — CL (ref 3.5–5.1)
Sodium: 145 mmol/L (ref 135–145)
Sodium: 144 mmol/L (ref 135–145)
Total Bilirubin: 1.7 mg/dL — ABNORMAL HIGH (ref 0.3–1.2)
Total Bilirubin: 0.5 mg/dL (ref 0.3–1.2)
Total Protein: 5 g/dL — ABNORMAL LOW (ref 6.5–8.1)
Total Protein: 4.9 g/dL — ABNORMAL LOW (ref 6.5–8.1)

## 2021-07-20 LAB — I-STAT ARTERIAL BLOOD GAS, ED
Acid-base deficit: 2 mmol/L (ref 0.0–2.0)
Bicarbonate: 22.5 mmol/L (ref 20.0–28.0)
Calcium, Ion: 1.19 mmol/L (ref 1.15–1.40)
HCT: 27 % — ABNORMAL LOW (ref 36.0–46.0)
Hemoglobin: 9.2 g/dL — ABNORMAL LOW (ref 12.0–15.0)
O2 Saturation: 97 %
Potassium: <2 mmol/L — CL (ref 3.5–5.1)
Sodium: 149 mmol/L — ABNORMAL HIGH (ref 135–145)
TCO2: 24 mmol/L (ref 22–32)
pCO2 arterial: 35.8 mmHg (ref 32.0–48.0)
pH, Arterial: 7.406 (ref 7.350–7.450)
pO2, Arterial: 90 mmHg (ref 83.0–108.0)

## 2021-07-20 LAB — I-STAT CHEM 8, ED
BUN: 13 mg/dL (ref 6–20)
Calcium, Ion: 1.02 mmol/L — ABNORMAL LOW (ref 1.15–1.40)
Chloride: 111 mmol/L (ref 98–111)
Creatinine, Ser: 1 mg/dL (ref 0.44–1.00)
Glucose, Bld: 74 mg/dL (ref 70–99)
HCT: 28 % — ABNORMAL LOW (ref 36.0–46.0)
Hemoglobin: 9.5 g/dL — ABNORMAL LOW (ref 12.0–15.0)
Potassium: 2.7 mmol/L — CL (ref 3.5–5.1)
Sodium: 147 mmol/L — ABNORMAL HIGH (ref 135–145)
TCO2: 24 mmol/L (ref 22–32)

## 2021-07-20 LAB — CBC
HCT: 31 % — ABNORMAL LOW (ref 36.0–46.0)
Hemoglobin: 9.7 g/dL — ABNORMAL LOW (ref 12.0–15.0)
MCH: 28.8 pg (ref 26.0–34.0)
MCHC: 31.3 g/dL (ref 30.0–36.0)
MCV: 92 fL (ref 80.0–100.0)
Platelets: 368 10*3/uL (ref 150–400)
RBC: 3.37 MIL/uL — ABNORMAL LOW (ref 3.87–5.11)
RDW: 16.6 % — ABNORMAL HIGH (ref 11.5–15.5)
WBC: 7 10*3/uL (ref 4.0–10.5)
nRBC: 0 % (ref 0.0–0.2)

## 2021-07-20 LAB — POTASSIUM: Potassium: 3.9 mmol/L (ref 3.5–5.1)

## 2021-07-20 LAB — LACTIC ACID, PLASMA: Lactic Acid, Venous: 1.1 mmol/L (ref 0.5–1.9)

## 2021-07-20 LAB — CORTISOL: Cortisol, Plasma: 19.8 ug/dL

## 2021-07-20 LAB — CBG MONITORING, ED
Glucose-Capillary: 127 mg/dL — ABNORMAL HIGH (ref 70–99)
Glucose-Capillary: <10 mg/dL — CL (ref 70–99)
Glucose-Capillary: 125 mg/dL — ABNORMAL HIGH (ref 70–99)
Glucose-Capillary: 29 mg/dL — CL (ref 70–99)
Glucose-Capillary: 135 mg/dL — ABNORMAL HIGH (ref 70–99)
Glucose-Capillary: 123 mg/dL — ABNORMAL HIGH (ref 70–99)

## 2021-07-20 LAB — MAGNESIUM: Magnesium: 1.6 mg/dL — ABNORMAL LOW (ref 1.7–2.4)

## 2021-07-20 LAB — RESP PANEL BY RT-PCR (FLU A&B, COVID) ARPGX2
Influenza A by PCR: NEGATIVE
Influenza B by PCR: NEGATIVE
SARS Coronavirus 2 by RT PCR: POSITIVE — AB

## 2021-07-20 LAB — AMYLASE: Amylase: 56 U/L (ref 28–100)

## 2021-07-20 LAB — LIPASE, BLOOD: Lipase: 21 U/L (ref 11–51)

## 2021-07-20 LAB — SALICYLATE LEVEL: Salicylate Lvl: <7 mg/dL — ABNORMAL LOW (ref 7.0–30.0)

## 2021-07-20 LAB — PROCALCITONIN: Procalcitonin: 0.54 ng/mL

## 2021-07-20 LAB — MRSA NEXT GEN BY PCR, NASAL: MRSA by PCR Next Gen: NOT DETECTED

## 2021-07-20 LAB — ACETAMINOPHEN LEVEL: Acetaminophen (Tylenol), Serum: <10 ug/mL — ABNORMAL LOW (ref 10–30)

## 2021-07-20 MED ORDER — MAGNESIUM SULFATE 2 GM/50ML IV SOLN
2.0000 g | Freq: Once | INTRAVENOUS | Status: AC
  Administered 2021-07-20: 2 g via INTRAVENOUS
  Filled 2021-07-20: qty 50

## 2021-07-20 MED ORDER — VITAL HIGH PROTEIN PO LIQD
1000.0000 mL | ORAL | Status: DC
  Administered 2021-07-20 (×2): 1000 mL
  Filled 2021-07-20: qty 1000

## 2021-07-20 MED ORDER — SODIUM CHLORIDE 0.9% FLUSH
10.0000 mL | Freq: Two times a day (BID) | INTRAVENOUS | Status: DC
  Administered 2021-07-20 – 2021-07-29 (×15): 10 mL

## 2021-07-20 MED ORDER — ROCURONIUM BROMIDE 50 MG/5ML IV SOLN
INTRAVENOUS | Status: DC | PRN
  Administered 2021-07-20: 86 mg via INTRAVENOUS

## 2021-07-20 MED ORDER — POLYETHYLENE GLYCOL 3350 17 G PO PACK: 17.0000 g | PACK | Freq: Every day | ORAL | Status: DC | PRN

## 2021-07-20 MED ORDER — LACTATED RINGERS IV SOLN: INTRAVENOUS | Status: DC

## 2021-07-20 MED ORDER — PROSOURCE TF PO LIQD
45.0000 mL | Freq: Two times a day (BID) | ORAL | Status: DC
  Administered 2021-07-20 – 2021-07-21 (×3): 45 mL
  Filled 2021-07-20 (×4): qty 45

## 2021-07-20 MED ORDER — DOCUSATE SODIUM 50 MG/5ML PO LIQD: 100.0000 mg | Freq: Two times a day (BID) | ORAL | Status: DC | PRN

## 2021-07-20 MED ORDER — POTASSIUM CHLORIDE 10 MEQ/100ML IV SOLN
10.0000 meq | Freq: Once | INTRAVENOUS | Status: AC
  Administered 2021-07-20: 10 meq via INTRAVENOUS
  Filled 2021-07-20: qty 100

## 2021-07-20 MED ORDER — SODIUM CHLORIDE 0.9 % IV SOLN
INTRAVENOUS | Status: DC | PRN
  Administered 2021-07-20: 1000 mL via INTRAVENOUS

## 2021-07-20 MED ORDER — NOREPINEPHRINE 4 MG/250ML-% IV SOLN
0.0000 ug/min | INTRAVENOUS | Status: DC
Start: 2021-07-20 — End: 2021-07-22
  Administered 2021-07-20: 8 ug/min via INTRAVENOUS

## 2021-07-20 MED ORDER — NOREPINEPHRINE 4 MG/250ML-% IV SOLN
INTRAVENOUS | Status: DC | PRN
  Administered 2021-07-20: 10 ug/min via INTRAVENOUS

## 2021-07-20 MED ORDER — DEXTROSE 10 % IV SOLN: INTRAVENOUS | Status: DC

## 2021-07-20 MED ORDER — POTASSIUM CHLORIDE 10 MEQ/100ML IV SOLN
10.0000 meq | INTRAVENOUS | Status: AC
Start: 2021-07-20 — End: 2021-07-21
  Administered 2021-07-20 (×5): 10 meq via INTRAVENOUS
  Filled 2021-07-20 (×5): qty 100

## 2021-07-20 MED ORDER — ORAL CARE MOUTH RINSE
15.0000 mL | OROMUCOSAL | Status: DC
  Administered 2021-07-20 – 2021-07-21 (×10): 15 mL via OROMUCOSAL

## 2021-07-20 MED ORDER — LEVETIRACETAM IN NACL 1000 MG/100ML IV SOLN
1000.0000 mg | Freq: Once | INTRAVENOUS | Status: AC
  Administered 2021-07-20: 1000 mg via INTRAVENOUS
  Filled 2021-07-20: qty 100

## 2021-07-20 MED ORDER — NOREPINEPHRINE 4 MG/250ML-% IV SOLN
INTRAVENOUS | Status: AC
  Filled 2021-07-20: qty 250

## 2021-07-20 MED ORDER — DEXTROSE 50 % IV SOLN: 1.0000 | Freq: Once | INTRAVENOUS | Status: AC

## 2021-07-20 MED ORDER — ETOMIDATE 2 MG/ML IV SOLN
INTRAVENOUS | Status: DC | PRN
  Administered 2021-07-20: 20 mg via INTRAVENOUS

## 2021-07-20 MED ORDER — DEXTROSE 50 % IV SOLN
INTRAVENOUS | Status: AC
  Administered 2021-07-20: 50 mL via INTRAVENOUS
  Filled 2021-07-20: qty 50

## 2021-07-20 MED ORDER — POTASSIUM CHLORIDE 20 MEQ PO PACK
60.0000 meq | PACK | Freq: Two times a day (BID) | ORAL | Status: DC
  Administered 2021-07-20: 60 meq
  Filled 2021-07-20 (×2): qty 3

## 2021-07-20 MED ORDER — POTASSIUM CHLORIDE 10 MEQ/100ML IV SOLN
10.0000 meq | INTRAVENOUS | Status: DC
  Administered 2021-07-20 (×2): 10 meq via INTRAVENOUS
  Filled 2021-07-20 (×2): qty 100

## 2021-07-20 MED ORDER — FENTANYL 2500MCG IN NS 250ML (10MCG/ML) PREMIX INFUSION
25.0000 ug/h | INTRAVENOUS | Status: DC
  Administered 2021-07-20: 25 ug/h via INTRAVENOUS
  Filled 2021-07-20: qty 250

## 2021-07-20 MED ORDER — PANTOPRAZOLE SODIUM 40 MG IV SOLR
40.0000 mg | Freq: Every day | INTRAVENOUS | Status: DC
  Administered 2021-07-20 – 2021-07-21 (×2): 40 mg via INTRAVENOUS
  Filled 2021-07-20 (×2): qty 40

## 2021-07-20 MED ORDER — FENTANYL BOLUS VIA INFUSION
25.0000 ug | INTRAVENOUS | Status: DC | PRN
Start: 2021-07-20 — End: 2021-07-21
  Administered 2021-07-20 – 2021-07-21 (×2): 25 ug via INTRAVENOUS
  Filled 2021-07-20: qty 25

## 2021-07-20 MED ORDER — SODIUM CHLORIDE 0.9% FLUSH
10.0000 mL | INTRAVENOUS | Status: DC | PRN
  Administered 2021-07-28: 10 mL

## 2021-07-20 MED ORDER — CHLORHEXIDINE GLUCONATE 0.12% ORAL RINSE (MEDLINE KIT)
15.0000 mL | Freq: Two times a day (BID) | OROMUCOSAL | Status: DC
  Administered 2021-07-20 – 2021-07-23 (×6): 15 mL via OROMUCOSAL

## 2021-07-20 MED ORDER — FENTANYL CITRATE PF 50 MCG/ML IJ SOSY
50.0000 ug | PREFILLED_SYRINGE | Freq: Once | INTRAMUSCULAR | Status: AC
  Administered 2021-07-20: 50 ug via INTRAVENOUS

## 2021-07-20 MED ORDER — FENTANYL CITRATE PF 50 MCG/ML IJ SOSY
PREFILLED_SYRINGE | INTRAMUSCULAR | Status: AC
  Filled 2021-07-20: qty 1

## 2021-07-20 MED ORDER — ACETAMINOPHEN 160 MG/5ML PO SOLN: 650.0000 mg | ORAL | Status: DC | PRN

## 2021-07-20 MED ORDER — LACTATED RINGERS IV BOLUS
1000.0000 mL | Freq: Once | INTRAVENOUS | Status: AC
  Administered 2021-07-20: 1000 mL via INTRAVENOUS

## 2021-07-20 MED ORDER — HEPARIN SODIUM (PORCINE) 5000 UNIT/ML IJ SOLN
5000.0000 [IU] | Freq: Three times a day (TID) | INTRAMUSCULAR | Status: DC
  Administered 2021-07-20 – 2021-07-22 (×6): 5000 [IU] via SUBCUTANEOUS
  Filled 2021-07-20 (×6): qty 1

## 2021-07-20 MED ORDER — ONDANSETRON HCL 4 MG/2ML IJ SOLN: 4.0000 mg | Freq: Four times a day (QID) | INTRAMUSCULAR | Status: DC | PRN

## 2021-07-20 MED ORDER — SODIUM CHLORIDE 0.9 % IV BOLUS
1000.0000 mL | Freq: Once | INTRAVENOUS | Status: AC
  Administered 2021-07-20: 1000 mL via INTRAVENOUS

## 2021-07-20 MED ORDER — CHLORHEXIDINE GLUCONATE CLOTH 2 % EX PADS
6.0000 | MEDICATED_PAD | Freq: Every day | CUTANEOUS | Status: DC
  Administered 2021-07-20 – 2021-07-23 (×4): 6 via TOPICAL

## 2021-07-20 MED ORDER — SODIUM CHLORIDE 0.9 % IV SOLN
750.0000 mg | Freq: Two times a day (BID) | INTRAVENOUS | Status: DC
  Administered 2021-07-21: 750 mg via INTRAVENOUS
  Filled 2021-07-20 (×2): qty 7.5

## 2021-07-20 NOTE — ED Notes (Addendum)
Bair hugger on patient due to rectal temp being 96.3

## 2021-07-20 NOTE — ED Notes (Signed)
Received notification that d-dimer clotted from lab. Additional lab drawn and sent down.

## 2021-07-20 NOTE — ED Notes (Signed)
Sophira Rumler son [phone] would like an update

## 2021-07-20 NOTE — ED Triage Notes (Signed)
Pt BIB [facility] from [place]. EMS called out for AMS for an hour prior to their arrival. Pt does have hx of seizures. EMS reported focal seizure unsure of length of time. EMS administered 5 mg of Versed IM. Pt shortly thereafter became apneic w/ EMS assisting with respirations via BVM. Pt's BP also dropped to 82 systolic post versed.   Pt last BP: 96/77 HR-80-120 Spo2- 100% on 15 L w/ BVM  CBG 77

## 2021-07-20 NOTE — Progress Notes (Signed)
[name] [id number] Admission Data: 07/20/2021 6:18 PM Attending Provider: Kipp Brood, MD  TCN:[id number], Brandt Loosen, MD Consults/ Treatment Team:   [name] is a 55 y.o. female patient admitted from ED awake, alert  & orientated  X 3,  Full Code, VSS - Blood pressure (!) 115/100, pulse 73, temperature (!) 96.8 F (36 C), temperature source Axillary, resp. rate (!) 23, last menstrual period 05/19/2016, SpO2 90 %. Intubated vented. . Tele # [id number] placed and pt is currently running: Afib IV site WDL: RIJ  with a transparent dsg that's clean dry and intact. evidence of skin break down noted on right heel, right shin, left shin.     Will cont to monitor and assist as needed.  Ceaser Ebeling Shelda Pal, RN 07/20/2021 6:18 PM

## 2021-07-20 NOTE — Progress Notes (Signed)
STAT EEG completed; results pedning; [employer]eles[employer]ists notified.

## 2021-07-20 NOTE — Consult Note (Signed)
Neurology Consultation  Reason for Consult: Unresponsive, seizure witnessed by EMS Referring Physician: Dr. Gilford Raid  CC: Patient unresponsive, intubated  History is obtained from: Chart review, unable to obtain from patient due to patient's condition  HPI: [name] is a 55 y.o. right-handed female with a medical history significant for type 2 diabetes mellitus, history of seizure in the setting of DKA, hyperlipidemia, essential hypertension, obesity, polysubstance abuse (tobacco, ETOH, marijuana, cocaine), chronic kidney disease stage III, paroxysmal atrial fibrillation on Eliquis, congestive heart failure, tobacco use, obstructive sleep apnea, and a right basal ganglia stroke in 2021 with residual left hemiparesis who presented to the ED today from her skilled nursing facility for evaluation of altered mental status. EMS was activated to facility after staff identified patient had altered mental status for about 1 hour. On a EMS arrival patient was noted to have seizure activity with a blood glucose of 77 and 5 mg of IM Versed was administered with seizure abortion. During transport, the patient became apneic and required bag-valve-mask ventilation. Patient was unresponsive on arrival and a blood glucose was obtained with results too low to read (<10). An amp of dextrose was administered without improvement in mental status and she was subsequently intubated for airway protection.  Regarding her seizure history, [name] at her first seizure in October 2021 and was found to be in DKA with a blood glucose level of over 700. She was placed on Keppra at this time and imaging revealed a small subcortical stroke in the right basal ganglia area. EEG while inpatient revealed generalized slowing consistent with metabolic encephalopathy but did not identify further seizure activity. She was reported to have some residual left-sided weakness, dysphagia, and is nonambulatory at baseline but she is able  to stand and pivot.  She has not had any further noted seizures since her reported seizure in October 2021 in the setting of DKA.  ROS: Unable to obtain due to altered mental status.   Past Medical History:  Diagnosis Date   Alcohol use    Ankle fracture, lateral malleolus, closed 2013   Anxiety    Aortic atherosclerosis ([place]) 03/10/2021   Breast mass, left 2013   CHF (congestive heart failure) ([place])    a. EF 20-25% by echo in 05/2016 with cath showing normal cors b. EF 50-55% in 07/2020 c. 01/2021: EF at 55-60% with moderate LVH   Chronic anemia    CKD (chronic kidney disease), stage III (HCC)    Cocaine abuse (HCC)    COPD (chronic obstructive pulmonary disease) ([place])    Diabetes mellitus, type 2 ([place])    Diabetic Charcot foot ([place])    Essential hypertension    History of cardiomyopathy    History of GI bleed 2011   Hyperlipidemia    Noncompliance    Obesity    PAF (paroxysmal atrial fibrillation) (HCC)    Panic attacks    PAT (paroxysmal atrial tachycardia) (HCC)    Previously on Amiodarone   Seizures ([place]) 10/01/2020   Sleep apnea    Not on CPAP   Stroke ([place]) 2018   Tobacco abuse    Urinary incontinence    Past Surgical History:  Procedure Laterality Date   ANGIOPLASTY ILLIAC ARTERY Right 04/23/2021   Procedure: ANGIOPLASTY AND STENT SUPERFICIAL FEMORAL ARTERY;  Surgeon: Cherre Robins, MD;  Location: [facility];  Service: Vascular;  Laterality: Right;   AORTOGRAM  04/23/2021   Procedure: AORTOGRAM;  Surgeon: Cherre Robins, MD;  Location: [facility];  Service: Vascular;;   AORTOGRAM Left 04/30/2021   Procedure: left lower extremity angiogram with second order cannulation;  Surgeon: Cherre Robins, MD;  Location: [facility];  Service: Vascular;  Laterality: Left;   BIOPSY  11/12/2020   Procedure: BIOPSY;  Surgeon: Harvel Quale, MD;  [facility];  Service: Gastroenterology;;   BREAST BIOPSY     CARDIAC CATHETERIZATION N/A 07/28/2016   Procedure: Left Heart  Cath and Coronary Angiography;  Surgeon: Jettie Booze, MD;  Location: [facility];  Service: Cardiovascular;  Laterality: N/A;   COLONOSCOPY N/A 05/10/2019   Procedure: COLONOSCOPY;  Surgeon: Danie Binder, MD;  [facility];  Service: Endoscopy;  Laterality: N/A;  Phenergan 12.5 mg IV in pre-op   DILATION AND CURETTAGE OF UTERUS     ESOPHAGOGASTRODUODENOSCOPY (EGD) WITH PROPOFOL N/A 11/12/2020   Procedure: ESOPHAGOGASTRODUODENOSCOPY (EGD) WITH PROPOFOL;  Surgeon: Harvel Quale, MD;  [facility];  Service: Gastroenterology;  Laterality: N/A;   I & D EXTREMITY Bilateral 09/22/2017   Procedure: BILATERAL DEBRIDEMENT LEG/FOOT ULCERS, APPLY VERAFLO WOUND VAC;  Surgeon: Newt Minion, MD;  Location: [facility];  Service: Orthopedics;  Laterality: Bilateral;   I & D EXTREMITY Right 10/11/2018   Procedure: IRRIGATION AND DEBRIDEMENT RIGHT HAND;  Surgeon: [name], MD;  Location: [facility];  Service: Orthopedics;  Laterality: Right;   I & D EXTREMITY Right 10/13/2018   Procedure: REPEAT IRRIGATION AND DEBRIDEMENT RIGHT HAND;  Surgeon: [name], MD;  Location: [facility];  Service: Orthopedics;  Laterality: Right;   I & D EXTREMITY Right 11/22/2018   Procedure: IRRIGATION AND DEBRIDEMENT AND PINNING RIGHT HAND;  Surgeon: [name], MD;  Location: [facility];  Service: Orthopedics;  Laterality: Right;   IR RADIOLOGIST EVAL & MGMT  07/05/2018   LOWER EXTREMITY ANGIOGRAM Bilateral 04/23/2021   Procedure: RIGHT  AND LEFT LOWER EXTREMITY ANGIOGRAM;  Surgeon: Cherre Robins, MD;  Location: [facility];  Service: Vascular;  Laterality: Bilateral;   POLYPECTOMY  05/10/2019   Procedure: POLYPECTOMY;  Surgeon: Danie Binder, MD;  [facility];  Service: Endoscopy;;   SKIN SPLIT GRAFT Bilateral 09/28/2017   Procedure: BILATERAL SPLIT THICKNESS SKIN GRAFT LEGS/FEET AND APPLY VAC;  Surgeon: Newt Minion, MD;  Location: [facility];  Service: Orthopedics;  Laterality:  Bilateral;   SKIN SPLIT GRAFT Right 11/22/2018   Procedure: SKIN GRAFT SPLIT THICKNESS;  Surgeon: [name], MD;  Location: [facility];  Service: Orthopedics;  Laterality: Right;   Family History  Problem Relation Age of Onset   Hypertension Mother    Heart attack Mother    Hypertension Father        CABG    Hypertension Sister    Hypertension Brother    Hypertension Sister    Cancer Sister        breast    Arthritis Other    Cancer Other    Diabetes Other    Asthma Other    Hypertension Daughter    Hypertension Son    Social History:   reports that she has been smoking cigarettes. She has a 5.00 pack-year smoking history. She has never used smokeless tobacco. She reports that she does not currently use alcohol. She reports that she does not currently use drugs after having used the following drugs: Marijuana and Cocaine. Frequency: 3.00 times per week.  Medications  Current Facility-Administered Medications:    0.9 %  sodium chloride infusion, , Intravenous, Continuous  PRN, Isla Pence, MD, Last Rate: 999 mL/hr at 07/20/21 1156, 1,000 mL at 07/20/21 1156   etomidate (AMIDATE) injection, , Intravenous, PRN, Isla Pence, MD, 20 mg at 07/20/21 1157   fentaNYL (SUBLIMAZE) 50 MCG/ML injection, , , ,    fentaNYL (SUBLIMAZE) bolus via infusion 25 mcg, 25 mcg, Intravenous, Q1H PRN, Isla Pence, MD   fentaNYL 2587mcg in NS 265mL (45mcg/ml) infusion-PREMIX, 25-400 mcg/hr, Intravenous, Continuous, Isla Pence, MD, Last Rate: 2.5 mL/hr at 07/20/21 1233, 25 mcg/hr at 07/20/21 1233   norepinephrine (LEVOPHED) 4mg  in 276mL premix infusion, 0-40 mcg/min, Intravenous, Titrated, Isla Pence, MD, Last Rate: 30 mL/hr at 07/20/21 1316, 8 mcg/min at 07/20/21 1316   norepinephrine (LEVOPHED) 4mg  in 276mL premix infusion, , Intravenous, Continuous PRN, Isla Pence, MD, Last Rate: 15 mL/hr at 07/20/21 1220, 8 mcg/kg/min at 07/20/21 1231   potassium chloride 10 mEq in 100 mL IVPB,  10 mEq, Intravenous, Once, Isla Pence, MD, Last Rate: 100 mL/hr at 07/20/21 1329, 10 mEq at 07/20/21 1329   rocuronium (ZEMURON) injection, , Intravenous, PRN, Isla Pence, MD, 86 mg at 07/20/21 1157  Current Outpatient Medications:    acetaminophen (TYLENOL) 325 MG tablet, Take 2 tablets (650 mg total) by mouth every 6 (six) hours as needed for headache, fever or mild pain. (Patient taking differently: Take 650 mg by mouth every 6 (six) hours as needed for fever (or pain).), Disp: , Rfl:    Alogliptin Benzoate 25 MG TABS, Take 1 tablet by mouth daily., Disp: , Rfl:    amLODipine (NORVASC) 10 MG tablet, Take 1 tablet (10 mg total) by mouth daily., Disp: 30 tablet, Rfl: 5   aspirin EC 81 MG EC tablet, Take 1 tablet (81 mg total) by mouth daily. Swallow whole., Disp: 30 tablet, Rfl: 11   benztropine (COGENTIN) 0.5 MG tablet, Take 1 tablet (0.5 mg total) by mouth 2 (two) times daily., Disp: 60 tablet, Rfl: 0   Dextrose-Sodium Chloride (DEXTROSE 5 % AND 0.45% NACL) infusion, Inject 100 mL/hr into the vein See admin instructions. 100 ml's/hr subq every hour for hydration for 8 hours; "1 liter via clysis", Disp: , Rfl:    ELIQUIS 5 MG TABS tablet, Take 5 mg by mouth 2 (two) times daily., Disp: , Rfl:    FLUoxetine (PROZAC) 20 MG capsule, Take 1 capsule (20 mg total) by mouth daily., Disp: 30 capsule, Rfl: 0   HUMALOG KWIKPEN 100 UNIT/ML KwikPen, Inject 1-10 Units into the skin See admin instructions. Inject 1-10 units into the skin before meals and at bedtime, PER SLIDING SCALE: BGL 151-200 = 1 unit; 201-250 = 2 units; 251-300 = 4 units; 301-350 = 6 units; 351-400 = 8 units; 401-450 = 10 units, Disp: , Rfl:    LANTUS SOLOSTAR 100 UNIT/ML Solostar Pen, Inject 8 Units into the skin in the morning., Disp: , Rfl:    levETIRAcetam (KEPPRA) 750 MG tablet, Take 1 tablet (750 mg total) by mouth 2 (two) times daily., Disp: 60 tablet, Rfl: 5   metoprolol succinate (TOPROL-XL) 50 MG 24 hr tablet, Take 1  tablet (50 mg total) by mouth daily. Take with or immediately following a meal., Disp: 30 tablet, Rfl: 0   nicotine (NICODERM CQ - DOSED IN MG/24 HR) 7 mg/24hr patch, Place 1 patch (7 mg total) onto the skin daily., Disp: 28 patch, Rfl: 0   omeprazole (PRILOSEC) 20 MG capsule, Take 20 mg by mouth daily before breakfast., Disp: , Rfl:    oxybutynin (DITROPAN-XL) 5 MG 24 hr  tablet, Take 5 mg by mouth in the morning., Disp: , Rfl:    rosuvastatin (CRESTOR) 10 MG tablet, Take 10 mg by mouth every evening., Disp: , Rfl:    sennosides-docusate sodium (SENOKOT-S) 8.6-50 MG tablet, Take 1 tablet by mouth in the morning and at bedtime., Disp: , Rfl:    tiotropium (SPIRIVA HANDIHALER) 18 MCG inhalation capsule, Place 18 mcg into inhaler and inhale daily as needed (Shortness of breath)., Disp: , Rfl:    TRULICITY 3 JY/7.8GN SOPN, Inject 3 mg into the skin every Friday., Disp: , Rfl:    zinc sulfate 220 (50 Zn) MG capsule, Take 1 capsule (220 mg total) by mouth daily., Disp: 30 capsule, Rfl: 2  Exam: Current vital signs: BP 91/71   Pulse (!) 56   Temp (!) 96.3 F (35.7 C) (Rectal)   Resp 18   LMP 05/19/2016   SpO2 97%  Vital signs in last 24 hours: Temp:  [96.3 F (35.7 C)-97.4 F (36.3 C)] 96.3 F (35.7 C) (09/25 1227) Pulse Rate:  [25-102] 56 (09/25 1324) Resp:  [6-24] 18 (09/25 1324) BP: (68-125)/(50-102) 91/71 (09/25 1324) SpO2:  [95 %-100 %] 97 % (09/25 1324) FiO2 (%):  [40 %] 40 % (09/25 1155)  GENERAL: Critically ill appearing female. Intubated and sedated in the ER. Psych: Unable to assess due to patient condition Head: Normocephalic and atraumatic, without obvious abnormality EENT: Normal conjunctivae, dry mucous membranes, oral ETT in place, OGT secured.  LUNGS: Respirations assisted via mechanical ventilation, inline suctioning is without secretions, no spontaneous respirations over set ventilator rate CV: Irregular rate and rhythm on cardiac monitor, heart rate varies between  70-120 on cardiac monitor during assessment ABDOMEN: Soft, non-distended Extremities: warm, well perfused, without obvious deformity  NEURO:  Mental Status:  Etomidate and rocuronium administered for RSI at 11:57, assessment at 13:30.  Does not open eyes, does not follow commands, does not respond to stimuli.  Does not have intact brainstem reflexes at this time.  Cranial Nerves:  II: Pupils are 4 mm, equal, round, and nonreactive to light. III, IV, VI: Midline gaze without intact oculocephalic reflex.  V: Does not blink to threat throughout VII: There is no facial movement, oral ETT in place, secured.  VIII: Unable to assess due to patient's condition IX, X: Cough and gag reflexes are not intact.  XI: Head is grossly midline XII: Unable to assess due to patient condition Motor: Tone is flaccid, there is no movement of extremities throughout spontaneously and there is no response to pain. Bulk is decreased throughout. Sensation: Unable to assess due to patient's condition Coordination: Unable to assess due to patient's condition.  DTRs: Unable to elicit reflexes throughout. Gait: Deferred  Labs I have reviewed labs in epic and the results pertinent to this consultation are: CBC    Component Value Date/Time   WBC 7.0 07/20/2021 1154   RBC 3.37 (L) 07/20/2021 1154   HGB 9.2 (L) 07/20/2021 1238   HCT 27.0 (L) 07/20/2021 1238   PLT 368 07/20/2021 1154   MCV 92.0 07/20/2021 1154   MCH 28.8 07/20/2021 1154   MCHC 31.3 07/20/2021 1154   RDW 16.6 (H) 07/20/2021 1154   LYMPHSABS 2.0 07/01/2021 1200   MONOABS 0.4 07/01/2021 1200   EOSABS 0.2 07/01/2021 1200   BASOSABS 0.1 07/01/2021 1200   CMP     Component Value Date/Time   NA 149 (H) 07/20/2021 1238   K <2.0 (LL) 07/20/2021 1238   CL 111 07/20/2021 1203  CO2 21 (L) 07/20/2021 1154   GLUCOSE 74 07/20/2021 1203   BUN 13 07/20/2021 1203   CREATININE 1.00 07/20/2021 1203   CREATININE 1.56 (H) 07/26/2018 1513   CALCIUM 7.8  (L) 07/20/2021 1154   CALCIUM 8.9 09/13/2014 0703   PROT 5.0 (L) 07/20/2021 1154   ALBUMIN 1.5 (L) 07/20/2021 1154   AST 19 07/20/2021 1154   ALT <5 07/20/2021 1154   ALKPHOS 63 07/20/2021 1154   BILITOT 1.7 (H) 07/20/2021 1154   GFRNONAA 60 (L) 07/20/2021 1154   GFRNONAA 37 (L) 07/26/2018 1513   GFRAA 54 (L) 05/09/2019 0956   GFRAA 43 (L) 07/26/2018 1513   Lipid Panel     Component Value Date/Time   CHOL 240 (H) 08/23/2020 0248   TRIG 261 (H) 12/03/2020 1432   HDL 34 (L) 08/23/2020 0248   CHOLHDL 7.1 08/23/2020 0248   VLDL 26 08/23/2020 0248   LDLCALC 180 (H) 08/23/2020 0248   LDLCALC 73 12/07/2018 1543   LDLDIRECT 122 (H) 01/08/2010 0818    Ref. Range 07/20/2021 13:38 07/20/2021 14:01  Glucose-Capillary Latest Ref Range: 70 - 99 mg/dL 29 (LL) 125 (H)    Ref. Range 07/20/2021 12:28  Magnesium Latest Ref Range: 1.7 - 2.4 mg/dL 1.6 (L)   Imaging I have reviewed the images obtained:  CT-scan of the brain 07/20/2021: 1. No acute intracranial abnormalities. 2. Chronic small vessel ischemic disease and brain atrophy. 3. Stable small left frontal meningioma.  Assessment: 55 year old female with history as above who was found to be altered at her facility and on EMS arrival had seizure activity s/p Versed IM.  On arrival respirations were assisted via BVM, patient was unresponsive, intubated for airway protection.Blood glucose was found to be <10 with a potassium of < 2 and a magnesium of 1.6.  CBG for EMS was 77. - Examination reveals patient with recent paralytic who is intubated on Fentanyl gtt in the ED. She is hypotensive requiring pressors and was found to be hypothermic and is on a warming blanket.  - CT head is without acute intracranial abnormalities - Patient has a history of a provoked seizure in the setting of DKA in October 2021 and remains on Keppra without seizure recurrence.  EEG at that time revealed encephalopathy without seizures or epileptiform discharges. -  Presentation is concerning for provoked seizure in the setting of metabolic derangements with hypoglycemia, hypokalemia, hypomagnesemia, and hypoalbuminemia. Will obtain EEG for further evaluation.  Impression: Breakthrough seizure-likely in the setting of toxic metabolic derangements as well as severe hypoglycemia Severe hypoglycemia Altered mental status, unresponsive  Hypokalemia Hypomagnesemia  Hypoalbuminemia   Recommendations: - Routine EEG will defer long-term monitoring unless routine EEG is concerning for seizure - We will consider LTM monitoring with further seizure activity - Continue home dose Keppra - Inpatient seizure cautions - 2 mg IV Ativan for any seizure lasting >5 minutes and notify neurology - Management of metabolic arrangements per EDP/PCCM - Consider MRI brain if patient remains altered despite adequate management of metabolic derangements - Neurology will continue to follow  Anibal Henderson, AGAC-NP [facility] Pager: [phone]   ATTENDING ADDENDUM Seen and examined at Dr. Eduard Clos request for seizures. Agree with H&P above which I independently verified. Agree with plan that I formulated.  Imaging reviewed personally - CTH with no acute changes. EEG reviewed at bedside - no seizures or epileptiform activity. Diffusely slow. Exam limited due to rocuronium - reactive pupils, breathing with vent, no gaze pref or deviation, no spontaneous movements  or movement to nox stim. ROS not possible due to mentation. Came in with a glucose of less than 10 and K less than 2-multifactorial toxic metabolic encephalopathy (metabolic derangements, severe hypoglycemia) causing lowering seizure threshold in a patient with known seizures.  Recs as above. Neurology will follow.  -- Amie Portland, MD Neurologist [facility] Pager: [phone]  CRITICAL CARE ATTESTATION Performed by: Amie Portland, MD Total critical care time: 33  minutes Critical care time was exclusive of separately billable procedures and treating other patients and/or supervising APPs/Residents/Students Critical care was necessary to treat or prevent imminent or life-threatening deterioration due to breakthrough seizure, toxic metabolic encephalopathy This patient is critically ill and at significant risk for neurological worsening and/or death and care requires constant monitoring. Critical care was time spent personally by me on the following activities: development of treatment plan with patient and/or surrogate as well as nursing, discussions with consultants, evaluation of patient's response to treatment, examination of patient, obtaining history from patient or surrogate, ordering and performing treatments and interventions, ordering and review of laboratory studies, ordering and review of radiographic studies, pulse oximetry, re-evaluation of patient's condition, participation in multidisciplinary rounds and medical decision making of high complexity in the care of this patient.

## 2021-07-20 NOTE — ED Provider Notes (Signed)
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 50 mL (50 mLs Intravenous Given 07/20/21 1342)    ED Course  I have reviewed the triage vital signs and the nursing notes.  Pertinent labs & imaging results that were available during my care of the patient were reviewed by me and considered in my medical decision making (see chart for details).    MDM Rules/Calculators/A&P                           Pt had no peripheral access, so I placed a right IO and did a straight stick for bood in her right femoral artery for an I-stat.  She was then intubated and then a right IJ placed for access.   Pt was hypotensive also, so she was given fluids and put on a levophed drip.  It was started in the IO and transitioned to the IJ line.  Pt was hypoglycemic initially and was  given 1 amp d50 upon arrival.  It did go up, but it came back down, so an additional amp was given and pt was put on a d10 drip.  Pt is afebrile.  Doubt sepsis.  Pt's K and Mg are low, so this is replaced,  Pt d/w Dr. Rory Percy (neurology) who will see her.  Pt d/w CCM for admission.  Pt's son updated.  CRITICAL CARE Performed by: Isla Pence   Total critical care time: 60 minutes  Critical care time was exclusive of separately billable procedures and treating other patients.  Critical care was necessary to treat or prevent imminent or life-threatening deterioration.  Critical care was time spent personally by me on the following activities: development of treatment plan with patient and/or surrogate as well as nursing, discussions with consultants, evaluation of patient's response to treatment, examination of patient, obtaining history from patient or surrogate, ordering and performing treatments and interventions, ordering and review of laboratory studies, ordering and review of radiographic studies, pulse oximetry and re-evaluation of patient's condition.      Final Clinical Impression(s) / ED Diagnoses Final diagnoses:  Seizure ([place])  Respiratory arrest ([place])  Hypokalemia  Hypoglycemia  Hypomagnesemia    Rx / DC Orders ED Discharge Orders     None        Isla Pence, MD 07/20/21 1430

## 2021-07-20 NOTE — Consult Note (Deleted)
This is a stat inpatient EEG performed on: 07/20/2021 Clinical indication: Seizure  Clinical history: The patient is a 55 year old woman with a history of hypertension and atrial fibrillation cardiomyopathy and diabetes and seizure witnessed by EMS.  She is currently intubated and sedated. Introduction: This is a stat inpatient EEG performed utilizing the standard 10-20 system of electrode placement with 21 channels of EEG and a single channel of EKG monitoring.  Approximately 20:14 minutes of EEG captured.  Photic stimulation and hyperventilation were not performed.  Description of the record: The awake background is continuous and symmetric characterized by admixed theta delta slowing with frontal predominant faster frequencies superimposed.  No stage II sleep architecture seen. No seizures, epileptiform discharges, or evolving ictal patterns seen.  The single channel EKG monitor shows an irregularly irregular heart rate in the 60-70 bpm range.    Impression: Abnormal encephalopathic EEG due to: 1.  Moderate generalized slowing of the background consistent with a moderate degree of diffuse or multifocal cerebral dysfunction along with medication effect 2.No seizures, epileptiform discharges, or evolving ictal patterns seen

## 2021-07-20 NOTE — ED Notes (Signed)
Attempted to insert temp foley several times with assistance from [place], Therapist, sports and [employer], Therapist, sports. Unable to insert.

## 2021-07-20 NOTE — Progress Notes (Signed)
eLink Physician-Brief Progress Note Patient Name: [name] DOB: 12/26/1965 MRN: [id number]   Date of Service  07/20/2021  HPI/Events of Note  Patient admitted with severe hypoglycemia. Q1 CBGs 215 now with TF at goal of 40 and D10 at 100 cc/hr. Received request to adjust the D10 and/or CBGs.  eICU Interventions  Decreased D10 to 50 cc/hr. Continue q1 CBG for now.     Intervention Category Intermediate Interventions: Hyperglycemia - evaluation and treatment  [name] 07/20/2021, 9:35 PM

## 2021-07-20 NOTE — Procedures (Signed)
This is a stat inpatient EEG performed on: 07/20/2021 Clinical indication: Seizure  Clinical history: The patient is a 55 year old woman with a history of hypertension and atrial fibrillation cardiomyopathy and diabetes and seizure witnessed by EMS.  She is currently intubated and sedated. Introduction: This is a stat inpatient EEG performed utilizing the standard 10-20 system of electrode placement with 21 channels of EEG and a single channel of EKG monitoring.  Approximately 20:14 minutes of EEG captured.  Photic stimulation and hyperventilation were not performed.  Description of the record: The awake background is continuous and symmetric characterized by admixed theta delta slowing with frontal predominant faster frequencies superimposed.  No stage II sleep architecture seen. No seizures, epileptiform discharges, or evolving ictal patterns seen.  The single channel EKG monitor shows an irregularly irregular heart rate in the 60-70 bpm range.    Impression: Abnormal encephalopathic EEG due to: 1.  Moderate generalized slowing of the background consistent with a moderate degree of diffuse or multifocal cerebral dysfunction along with medication effect 2.No seizures, epileptiform discharges, or evolving ictal patterns seen

## 2021-07-20 NOTE — Code Documentation (Signed)
[place] MD at bedside inserting CVC

## 2021-07-20 NOTE — H&P (Signed)
NAME:  KATALEENA HOLSAPPLE, MRN:  [id number], DOB:  06-11-1966, LOS: 0 ADMISSION DATE:  07/20/2021, CONSULTATION DATE:  07/20/21 REFERRING MD:  Gilford Raid - EM, CHIEF COMPLAINT:  unresponsiveness History of Present Illness:   55 yo F PMH sz, combined CHF with recovered EF, CKD III ,DM2, HTN, Afib, PVD, seizure  who presented to the ED 9/25 from nursing facility where the patient was found unresponsive. There is question of whether the patient had a seizure at the care facility but history from facility was very limited. While being transported with EMS, pt had a generalized sz. She received 5mg  Versed after which time she required BVM. In the ED pt still without adequate respiratory effort and was intubated. POC labs revealed undetectably low blood glucose for which pt was given d50.  In ED Pt also became hypotensive, a central line was placed and pt was started on NE. Neuro was consulted in the ED. CT H without acute abnormality -- stable small L frontal meningioma and chronic small vessel dz.   POC labs revealed Na 145 K 3.2 Co2 21 Cr 1.08 BUN 12 LA 1.1 WBC 7 hgb 9.7   Given K in ED   PCCM consulted for admission    Pertinent  Medical History  Seizure DM2 PVD Combined CHF, recovered EF  CKD  Afib    Significant Hospital Events: Including procedures, antibiotic start and stop dates in addition to other pertinent events   9/25 to ED from nursing facility for unresponsiveness. 5versed for sz with EMS. Intubated in ED. Admit to PCCM. Labile glucose, low K and Mag   Interim History / Subjective:  Intubated in ED   Objective   Blood pressure 91/71, pulse (!) 56, temperature (!) 96.3 F (35.7 C), temperature source Rectal, resp. rate 18, last menstrual period 05/19/2016, SpO2 97 %.    Vent Mode: PRVC FiO2 (%):  [40 %] 40 % Set Rate:  [18 bmp] 18 bmp Vt Set:  [380 mL] 380 mL PEEP:  [5 cmH20] 5 cmH20 Plateau Pressure:  [16 cmH20] 16 cmH20  No intake or output data in the 24 hours  ending 07/20/21 1330 There were no vitals filed for this visit.  Examination: General: Critically and chronically ill appearing adult F, appears older than stated age. Intubated NAD  HENT: NCAT pink mm ETT secure Lungs: CTAb symmetrical chest expansion, mechanically ventilated  Cardiovascular: bradycardic, frequent PVCs. Cap refill brisk  Abdomen: soft ndnt hypoactice Extremities: muscle wasting BLE. Lower extremity IO. No cyanosis Neuro: Sedated. No response to pain.  GU: no foley  Resolved Hospital Problem list     Assessment & Plan:   Acute metabolic encephalopathy Seizure -hx sz (2021 associated with DKA) Variable Glu with instances of marked hypoglycemia. No acute finding on CT. ?infectious but labs otherwise not consistent with infection so far  -seizure possibly provoked by hypoglycemia, but story doesn't totally fit as Glu reportedly 120s with EMS when pt seized -medication administration also contributing in acute setting -- RSI at 1157, phys exam around 1345 P -neuro consulted -EEG -Keppra -PRN ativan for SE -wean sedation in ICU while on LTM, RASS goal 0 -correct other metabolic abnormalities as indicated -RASS goal 0 -trend LA -check acetaminophen and salicylate levels -check keppra level   Acute respiratory failure  -intubated with apnea following versed admin in setting of seizure COVID positive -- positive 9/6 and again 9/25 P -Full MV support -WUA/SBT as indicated -VAP, PAD, pulm hygiene -PRN Cxr and gas  -does  not need covid precautions  Hypoglycemia, severe -seems to be labile since time with EMS. Etiology unclear. Brother says poor PO intake but per son went out to dinner, ate. Does have hx DM and has several meds on home list -- ?accidental overdose.  P -start EN -cont dextrose gtt -close CBG monitoring  Bradycardia -suspect due to metabolic abnormalities, hypothermia P -Bair hugger -repalce K, mag  -ICU monitoring  Shock -?hypovolemia  in setting of poor PO intake per brother Hx combined CHF with recovered EF P -ECHO -NE, IVF   Hypokalemia Hypomagnesemia Hypocalcemia P -replace, trend  Malnutrition FTT Debility -baseline poor mobility, poor nutritional status wth extremity muscle wasting, hypoalbuminemia. Presenting from [facility] when appropriate   Best Practice (right click and "Reselect all SmartList Selections" daily)   Diet/type: tubefeeds DVT prophylaxis: prophylactic heparin  GI prophylaxis: PPI Lines: Central line Foley:  N/A Code Status:  full code Last date of multidisciplinary goals of care discussion [pending. Family updated 9/25]  Labs   CBC: Recent Labs  Lab 07/20/21 1154 07/20/21 1203 07/20/21 1204 07/20/21 1238  WBC 7.0  --   --   --   HGB 9.7* 9.5* 9.5* 9.2*  HCT 31.0* 28.0* 28.0* 27.0*  MCV 92.0  --   --   --   PLT 368  --   --   --     Basic Metabolic Panel: Recent Labs  Lab 07/20/21 1154 07/20/21 1203 07/20/21 1204 07/20/21 1238  NA 145 147* 147* 149*  K 3.2* 2.7* 2.7* <2.0*  CL 116* 111  --   --   CO2 21*  --   --   --   GLUCOSE 90 74  --   --   BUN 12 13  --   --   CREATININE 1.08* 1.00  --   --   CALCIUM 7.8*  --   --   --    GFR: Estimated Creatinine Clearance: 54.6 mL/min (by C-G formula based on SCr of 1 mg/dL). Recent Labs  Lab 07/20/21 1154 07/20/21 1156  WBC 7.0  --   LATICACIDVEN  --  1.1    Liver Function Tests: Recent Labs  Lab 07/20/21 1154  AST 19  ALT <5  ALKPHOS 63  BILITOT 1.7*  PROT 5.0*  ALBUMIN 1.5*   No results for input(s): LIPASE, AMYLASE in the last 168 hours. No results for input(s): AMMONIA in the last 168 hours.  ABG    Component Value Date/Time   PHART 7.406 07/20/2021 1238   PCO2ART 35.8 07/20/2021 1238   PO2ART 90 07/20/2021 1238   HCO3 22.5 07/20/2021 1238   TCO2 24 07/20/2021 1238   ACIDBASEDEF 2.0 07/20/2021 1238   O2SAT 97.0 07/20/2021 1238     Coagulation Profile: No results for  input(s): INR, PROTIME in the last 168 hours.  Cardiac Enzymes: No results for input(s): CKTOTAL, CKMB, CKMBINDEX, TROPONINI in the last 168 hours.  HbA1C: Hgb A1c MFr Bld  Date/Time Value Ref Range Status  07/01/2021 05:18 PM 6.1 (H) 4.8 - 5.6 % Final    Comment:    (NOTE) Pre diabetes:          5.7%-6.4%  Diabetes:              >6.4%  Glycemic control for   <7.0% adults with diabetes   03/23/2021 05:00 AM 11.1 (H) 4.8 - 5.6 % Final    Comment:    (NOTE)  Prediabetes: 5.7 - 6.4         Diabetes: >6.4         Glycemic control for adults with diabetes: <7.0     CBG: Recent Labs  Lab 07/20/21 1150 07/20/21 1200  GLUCAP <10* 127*    Review of Systems:   Unable to obtain, intubated sedated   Past Medical History:  She,  has a past medical history of Alcohol use, Ankle fracture, lateral malleolus, closed (2013), Anxiety, Aortic atherosclerosis ([place]) (03/10/2021), Breast mass, left (2013), CHF (congestive heart failure) ([place]), Chronic anemia, CKD (chronic kidney disease), stage III ([place]), Cocaine abuse ([place]), COPD (chronic obstructive pulmonary disease) ([place]), Diabetes mellitus, type 2 ([place]), Diabetic Charcot foot ([place]), Essential hypertension, History of cardiomyopathy, History of GI bleed (2011), Hyperlipidemia, Noncompliance, Obesity, PAF (paroxysmal atrial fibrillation) ([place]), Panic attacks, PAT (paroxysmal atrial tachycardia) ([place]), Seizures ([place]) (10/01/2020), Sleep apnea, Stroke ([place]) (2018), Tobacco abuse, and Urinary incontinence.   Surgical History:   Past Surgical History:  Procedure Laterality Date   ANGIOPLASTY ILLIAC ARTERY Right 04/23/2021   Procedure: ANGIOPLASTY AND STENT SUPERFICIAL FEMORAL ARTERY;  Surgeon: Cherre Robins, MD;  Location: [facility];  Service: Vascular;  Laterality: Right;   AORTOGRAM  04/23/2021   Procedure: AORTOGRAM;  Surgeon: Cherre Robins, MD;  Location: [facility];  Service: Vascular;;   AORTOGRAM Left 04/30/2021   Procedure: left lower  extremity angiogram with second order cannulation;  Surgeon: Cherre Robins, MD;  Location: [facility];  Service: Vascular;  Laterality: Left;   BIOPSY  11/12/2020   Procedure: BIOPSY;  Surgeon: Harvel Quale, MD;  [facility];  Service: Gastroenterology;;   BREAST BIOPSY     CARDIAC CATHETERIZATION N/A 07/28/2016   Procedure: Left Heart Cath and Coronary Angiography;  Surgeon: Jettie Booze, MD;  Location: [facility];  Service: Cardiovascular;  Laterality: N/A;   COLONOSCOPY N/A 05/10/2019   Procedure: COLONOSCOPY;  Surgeon: Danie Binder, MD;  [facility];  Service: Endoscopy;  Laterality: N/A;  Phenergan 12.5 mg IV in pre-op   DILATION AND CURETTAGE OF UTERUS     ESOPHAGOGASTRODUODENOSCOPY (EGD) WITH PROPOFOL N/A 11/12/2020   Procedure: ESOPHAGOGASTRODUODENOSCOPY (EGD) WITH PROPOFOL;  Surgeon: Harvel Quale, MD;  [facility];  Service: Gastroenterology;  Laterality: N/A;   I & D EXTREMITY Bilateral 09/22/2017   Procedure: BILATERAL DEBRIDEMENT LEG/FOOT ULCERS, APPLY VERAFLO WOUND VAC;  Surgeon: Newt Minion, MD;  Location: [facility];  Service: Orthopedics;  Laterality: Bilateral;   I & D EXTREMITY Right 10/11/2018   Procedure: IRRIGATION AND DEBRIDEMENT RIGHT HAND;  Surgeon: [name], MD;  Location: [facility];  Service: Orthopedics;  Laterality: Right;   I & D EXTREMITY Right 10/13/2018   Procedure: REPEAT IRRIGATION AND DEBRIDEMENT RIGHT HAND;  Surgeon: [name], MD;  Location: [facility];  Service: Orthopedics;  Laterality: Right;   I & D EXTREMITY Right 11/22/2018   Procedure: IRRIGATION AND DEBRIDEMENT AND PINNING RIGHT HAND;  Surgeon: [name], MD;  Location: [facility];  Service: Orthopedics;  Laterality: Right;   IR RADIOLOGIST EVAL & MGMT  07/05/2018   LOWER EXTREMITY ANGIOGRAM Bilateral 04/23/2021   Procedure: RIGHT  AND LEFT LOWER EXTREMITY ANGIOGRAM;  Surgeon: Cherre Robins, MD;  Location: [facility];  Service:  Vascular;  Laterality: Bilateral;   POLYPECTOMY  05/10/2019   Procedure: POLYPECTOMY;  Surgeon: Danie Binder, MD;  [facility];  Service: Endoscopy;;   SKIN SPLIT GRAFT Bilateral 09/28/2017  Procedure: BILATERAL SPLIT THICKNESS SKIN GRAFT LEGS/FEET AND APPLY VAC;  Surgeon: Newt Minion, MD;  Location: [facility];  Service: Orthopedics;  Laterality: Bilateral;   SKIN SPLIT GRAFT Right 11/22/2018   Procedure: SKIN GRAFT SPLIT THICKNESS;  Surgeon: [name], MD;  Location: [facility];  Service: Orthopedics;  Laterality: Right;     Social History:   reports that she has been smoking cigarettes. She has a 5.00 pack-year smoking history. She has never used smokeless tobacco. She reports that she does not currently use alcohol. She reports that she does not currently use drugs after having used the following drugs: Marijuana and Cocaine. Frequency: 3.00 times per week.   Family History:  Her family history includes Arthritis in an other family member; Asthma in an other family member; Cancer in her sister and another family member; Diabetes in an other family member; Heart attack in her mother; Hypertension in her brother, daughter, father, mother, sister, sister, and son.   Allergies No Known Allergies   Home Medications  Prior to Admission medications   Medication Sig Start Date End Date Taking? Authorizing Provider  acetaminophen (TYLENOL) 325 MG tablet Take 2 tablets (650 mg total) by mouth every 6 (six) hours as needed for headache, fever or mild pain. Patient taking differently: Take 650 mg by mouth every 6 (six) hours as needed for fever (or pain). 11/04/20   Johnson, Clanford L, MD  Alogliptin Benzoate 25 MG TABS Take 1 tablet by mouth daily. 04/20/21   [provider]  amLODipine (NORVASC) 10 MG tablet Take 1 tablet (10 mg total) by mouth daily. 12/07/20   Roxan Hockey, MD  aspirin EC 81 MG EC tablet Take 1 tablet (81 mg total) by mouth daily. Swallow whole. 05/28/21    Lavina Hamman, MD  benztropine (COGENTIN) 0.5 MG tablet Take 1 tablet (0.5 mg total) by mouth 2 (two) times daily. 05/27/21   Lavina Hamman, MD  Dextrose-Sodium Chloride (DEXTROSE 5 % AND 0.45% NACL) infusion Inject 100 mL/hr into the vein See admin instructions. 100 ml's/hr subq every hour for hydration for 8 hours; "1 liter via clysis" 06/30/21   [provider]  ELIQUIS 5 MG TABS tablet Take 5 mg by mouth 2 (two) times daily. 06/24/21   [provider]  FLUoxetine (PROZAC) 20 MG capsule Take 1 capsule (20 mg total) by mouth daily. 05/28/21   Lavina Hamman, MD  HUMALOG KWIKPEN 100 UNIT/ML KwikPen Inject 1-10 Units into the skin See admin instructions. Inject 1-10 units into the skin before meals and at bedtime, PER SLIDING SCALE: BGL 151-200 = 1 unit; 201-250 = 2 units; 251-300 = 4 units; 301-350 = 6 units; 351-400 = 8 units; 401-450 = 10 units 03/28/21   [provider]  LANTUS SOLOSTAR 100 UNIT/ML Solostar Pen Inject 8 Units into the skin in the morning.    [provider]  levETIRAcetam (KEPPRA) 750 MG tablet Take 1 tablet (750 mg total) by mouth 2 (two) times daily. 01/30/21   Suzzanne Cloud, NP  metoprolol succinate (TOPROL-XL) 50 MG 24 hr tablet Take 1 tablet (50 mg total) by mouth daily. Take with or immediately following a meal. 07/05/21   Little Ishikawa, MD  nicotine (NICODERM CQ - DOSED IN MG/24 HR) 7 mg/24hr patch Place 1 patch (7 mg total) onto the skin daily. 05/28/21   Lavina Hamman, MD  omeprazole (PRILOSEC) 20 MG capsule Take 20 mg by mouth daily before breakfast.  [provider]  oxybutynin (DITROPAN-XL) 5 MG 24 hr tablet Take 5 mg by mouth in the morning.    [provider]  rosuvastatin (CRESTOR) 10 MG tablet Take 10 mg by mouth every evening.    [provider]  sennosides-docusate sodium (SENOKOT-S) 8.6-50 MG tablet Take 1 tablet by mouth in the morning and at bedtime.    [provider]  tiotropium  (SPIRIVA HANDIHALER) 18 MCG inhalation capsule Place 18 mcg into inhaler and inhale daily as needed (Shortness of breath).    [provider]  TRULICITY 3 OF/7.5ZW SOPN Inject 3 mg into the skin every Friday. 03/11/21   [provider]  zinc sulfate 220 (50 Zn) MG capsule Take 1 capsule (220 mg total) by mouth daily. 12/08/20   Roxan Hockey, MD  albuterol (PROAIR HFA) 108 (90 Base) MCG/ACT inhaler INHALE 2 PUFFS EVERY 6 HOURS AS NEEDED FOR SHORTNESS OF BREATH/WHEEZING. Patient not taking: Reported on 04/21/2021 08/05/20 04/21/21  [name], MD     Critical care time: 61 min    CRITICAL CARE Performed by: Cristal Generous   Total critical care time: 61 minutes  Critical care time was exclusive of separately billable procedures and treating other patients.  Critical care was necessary to treat or prevent imminent or life-threatening deterioration.  Critical care was time spent personally by me on the following activities: development of treatment plan with patient and/or surrogate as well as nursing, discussions with consultants, evaluation of patient's response to treatment, examination of patient, obtaining history from patient or surrogate, ordering and performing treatments and interventions, ordering and review of laboratory studies, ordering and review of radiographic studies, pulse oximetry and re-evaluation of patient's condition.  Eliseo Gum MSN, AGACNP-BC [place] for pager 07/20/2021, 2:44 PM

## 2021-07-21 ENCOUNTER — Other Ambulatory Visit (HOSPITAL_COMMUNITY): Payer: Medicare Other

## 2021-07-21 ENCOUNTER — Inpatient Hospital Stay (HOSPITAL_COMMUNITY): Payer: Medicare Other

## 2021-07-21 DIAGNOSIS — J9621 Acute and chronic respiratory failure with hypoxia: Principal | ICD-10-CM

## 2021-07-21 DIAGNOSIS — E876 Hypokalemia: Secondary | ICD-10-CM | POA: Diagnosis not present

## 2021-07-21 DIAGNOSIS — I5042 Chronic combined systolic (congestive) and diastolic (congestive) heart failure: Secondary | ICD-10-CM | POA: Diagnosis not present

## 2021-07-21 DIAGNOSIS — R569 Unspecified convulsions: Secondary | ICD-10-CM | POA: Diagnosis not present

## 2021-07-21 DIAGNOSIS — N3000 Acute cystitis without hematuria: Secondary | ICD-10-CM

## 2021-07-21 DIAGNOSIS — R092 Respiratory arrest: Secondary | ICD-10-CM

## 2021-07-21 DIAGNOSIS — E162 Hypoglycemia, unspecified: Secondary | ICD-10-CM

## 2021-07-21 LAB — BASIC METABOLIC PANEL
Anion gap: 4 — ABNORMAL LOW (ref 5–15)
Anion gap: 6 (ref 5–15)
BUN: 11 mg/dL (ref 6–20)
BUN: 11 mg/dL (ref 6–20)
CO2: 21 mmol/L — ABNORMAL LOW (ref 22–32)
CO2: 21 mmol/L — ABNORMAL LOW (ref 22–32)
Calcium: 7.2 mg/dL — ABNORMAL LOW (ref 8.9–10.3)
Calcium: 7.4 mg/dL — ABNORMAL LOW (ref 8.9–10.3)
Chloride: 110 mmol/L (ref 98–111)
Chloride: 111 mmol/L (ref 98–111)
Creatinine, Ser: 1.01 mg/dL — ABNORMAL HIGH (ref 0.44–1.00)
Creatinine, Ser: 1.05 mg/dL — ABNORMAL HIGH (ref 0.44–1.00)
GFR, Estimated: >60 mL/min (ref >60)
GFR, Estimated: >60 mL/min (ref >60)
Glucose, Bld: 281 mg/dL — ABNORMAL HIGH (ref 70–99)
Glucose, Bld: 286 mg/dL — ABNORMAL HIGH (ref 70–99)
Potassium: 4.2 mmol/L (ref 3.5–5.1)
Potassium: 4.5 mmol/L (ref 3.5–5.1)
Sodium: 136 mmol/L (ref 135–145)
Sodium: 137 mmol/L (ref 135–145)

## 2021-07-21 LAB — CBC
HCT: 27.9 % — ABNORMAL LOW (ref 36.0–46.0)
Hemoglobin: 8.7 g/dL — ABNORMAL LOW (ref 12.0–15.0)
MCH: 28.6 pg (ref 26.0–34.0)
MCHC: 31.2 g/dL (ref 30.0–36.0)
MCV: 91.8 fL (ref 80.0–100.0)
Platelets: 334 10*3/uL (ref 150–400)
RBC: 3.04 MIL/uL — ABNORMAL LOW (ref 3.87–5.11)
RDW: 16.6 % — ABNORMAL HIGH (ref 11.5–15.5)
WBC: 10.7 10*3/uL — ABNORMAL HIGH (ref 4.0–10.5)
nRBC: 0 % (ref 0.0–0.2)

## 2021-07-21 LAB — URINALYSIS, MICROSCOPIC (REFLEX): WBC, UA: >50 WBC/hpf (ref 0–5)

## 2021-07-21 LAB — PHOSPHORUS
Phosphorus: 2 mg/dL — ABNORMAL LOW (ref 2.5–4.6)
Phosphorus: 2.9 mg/dL (ref 2.5–4.6)

## 2021-07-21 LAB — URINALYSIS, ROUTINE W REFLEX MICROSCOPIC
Glucose, UA: NEGATIVE mg/dL
Ketones, ur: NEGATIVE mg/dL
Nitrite: NEGATIVE
Protein, ur: >300 mg/dL — AB
Specific Gravity, Urine: 1.025 (ref 1.005–1.030)
pH: 5.5 (ref 5.0–8.0)

## 2021-07-21 LAB — GLUCOSE, CAPILLARY
Glucose-Capillary: 200 mg/dL — ABNORMAL HIGH (ref 70–99)
Glucose-Capillary: 209 mg/dL — ABNORMAL HIGH (ref 70–99)
Glucose-Capillary: 232 mg/dL — ABNORMAL HIGH (ref 70–99)
Glucose-Capillary: 254 mg/dL — ABNORMAL HIGH (ref 70–99)
Glucose-Capillary: 255 mg/dL — ABNORMAL HIGH (ref 70–99)
Glucose-Capillary: 268 mg/dL — ABNORMAL HIGH (ref 70–99)
Glucose-Capillary: 270 mg/dL — ABNORMAL HIGH (ref 70–99)
Glucose-Capillary: 310 mg/dL — ABNORMAL HIGH (ref 70–99)
Glucose-Capillary: 312 mg/dL — ABNORMAL HIGH (ref 70–99)
Glucose-Capillary: 87 mg/dL (ref 70–99)

## 2021-07-21 LAB — MAGNESIUM
Magnesium: 1.9 mg/dL (ref 1.7–2.4)
Magnesium: 2.1 mg/dL (ref 1.7–2.4)

## 2021-07-21 LAB — ECHOCARDIOGRAM COMPLETE
Area-P 1/2: 5.66 cm2
S' Lateral: 3.9 cm
Single Plane A4C EF: 51.9 %

## 2021-07-21 MED ORDER — JUVEN PO PACK
1.0000 | PACK | Freq: Two times a day (BID) | ORAL | Status: DC
  Administered 2021-07-21: 1
  Filled 2021-07-21 (×3): qty 1

## 2021-07-21 MED ORDER — LACTATED RINGERS IV BOLUS
500.0000 mL | Freq: Once | INTRAVENOUS | Status: AC
  Administered 2021-07-21: 500 mL via INTRAVENOUS

## 2021-07-21 MED ORDER — INSULIN GLARGINE-YFGN 100 UNIT/ML ~~LOC~~ SOLN
8.0000 [IU] | Freq: Every day | SUBCUTANEOUS | Status: DC
  Administered 2021-07-21: 8 [IU] via SUBCUTANEOUS
  Filled 2021-07-21 (×4): qty 0.08

## 2021-07-21 MED ORDER — LEVETIRACETAM IN NACL 500 MG/100ML IV SOLN
500.0000 mg | Freq: Two times a day (BID) | INTRAVENOUS | Status: DC
  Administered 2021-07-21 – 2021-07-24 (×6): 500 mg via INTRAVENOUS
  Filled 2021-07-21 (×6): qty 100

## 2021-07-21 MED ORDER — VITAL 1.5 CAL PO LIQD
1000.0000 mL | ORAL | Status: DC
  Administered 2021-07-21: 1000 mL

## 2021-07-21 MED ORDER — VITAL HIGH PROTEIN PO LIQD: 1000.0000 mL | ORAL | Status: DC

## 2021-07-21 MED ORDER — INSULIN ASPART 100 UNIT/ML IJ SOLN
0.0000 [IU] | INTRAMUSCULAR | Status: DC
  Administered 2021-07-21: 5 [IU] via SUBCUTANEOUS
  Administered 2021-07-21: 7 [IU] via SUBCUTANEOUS
  Administered 2021-07-21: 2 [IU] via SUBCUTANEOUS
  Administered 2021-07-21: 7 [IU] via SUBCUTANEOUS
  Administered 2021-07-22: 1 [IU] via SUBCUTANEOUS
  Administered 2021-07-23 (×2): 2 [IU] via SUBCUTANEOUS
  Administered 2021-07-23 – 2021-07-25 (×3): 1 [IU] via SUBCUTANEOUS
  Administered 2021-07-25: 2 [IU] via SUBCUTANEOUS
  Administered 2021-07-26 – 2021-07-27 (×2): 5 [IU] via SUBCUTANEOUS
  Administered 2021-07-27 (×2): 1 [IU] via SUBCUTANEOUS
  Administered 2021-07-28: 3 [IU] via SUBCUTANEOUS
  Administered 2021-07-29 (×2): 1 [IU] via SUBCUTANEOUS
  Administered 2021-07-29: 2 [IU] via SUBCUTANEOUS
  Administered 2021-07-31 (×2): 3 [IU] via SUBCUTANEOUS
  Administered 2021-07-31: 1 [IU] via SUBCUTANEOUS

## 2021-07-21 MED ORDER — SODIUM CHLORIDE 0.9 % IV SOLN
1.0000 g | INTRAVENOUS | Status: AC
  Administered 2021-07-21 – 2021-07-23 (×3): 1 g via INTRAVENOUS
  Filled 2021-07-21 (×3): qty 10

## 2021-07-21 MED ORDER — SODIUM PHOSPHATES 45 MMOLE/15ML IV SOLN
15.0000 mmol | Freq: Once | INTRAVENOUS | Status: AC
  Administered 2021-07-21: 15 mmol via INTRAVENOUS
  Filled 2021-07-21: qty 5

## 2021-07-21 NOTE — Progress Notes (Signed)
Initial Nutrition Assessment  DOCUMENTATION CODES:   Severe malnutrition in context of chronic illness  INTERVENTION:   Tube feeding via OG tube: - Vital 1.5 @ 45 ml/hr (1080 ml/day) - ProSource TF 45 ml BID  Tube feeding regimen provides 1700 kcal, 95 grams of protein, and 825 ml of H2O.  - 1 packet Juven BID per tube, each packet provides 95 calories, 2.5 grams of protein, and 9.8 grams of carbohydrate; also contains L-arginine and L-glutamine, vitamin C, vitamin E, vitamin B-12, zinc, calcium, and calcium Beta-hydroxy-Beta-methylbutyrate to support wound healing  NUTRITION DIAGNOSIS:   Severe Malnutrition related to chronic illness (CHF, COPD) as evidenced by severe muscle depletion, percent weight loss (30% weight loss in 5 months).  GOAL:   Patient will meet greater than or equal to 90% of their needs  MONITOR:   Vent status, Labs, TF tolerance, Weight trends, Skin, I & O's  REASON FOR ASSESSMENT:   Ventilator, Consult Enteral/tube feeding initiation and management  ASSESSMENT:   55 year old female who presented to the ED from [place] on 9/25 with AMS, witnessed seizures. PMH of seizures, CHF, CKD stage III, COPD, T2DM, HTN, HLD, polysubstance abuse (tobacco, EtOH marijuana, cocaine) atrial fibrillation, PVD. Pt required intubation in the ED.  Discussed pt with RN and during ICU rounds.  Consult received for tube feeding initiation and management. Pt with OG tube side port above the GE junction per chest x-ray yesterday. Discussed with RN who advanced tube which is now in the stomach per abdominal-xray.  Orders in place for Vital High Protein @ 40 ml/hr with ProSource TF 45 ml BID. However, Vital 1.5 tube feeds currently running at 40 ml/hr when RD entered room.  No family present at bedside. Per review of notes, pt's brother reported that pt has lost a significant amount of weight. Reviewed weight history in chart. Pt with decline in weight since 02/20/21.  Overall, pt has lost 29 kg since this date. This is a 30% weight loss in 5 months which is severe and significant for timeframe. Pt with CHF diagnosis so suspect weight fluctuates related to fluid status; however, given this significant amount of weight loss, strongly suspect that true dry weight loss in present.  Patient is currently intubated on ventilator support MV: 6.7 L/min Temp (24hrs), Avg:98.1 F (36.7 C), Min:96.8 F (36 C), Max:99.7 F (37.6 C)  Drips: Fentanyl  Medications reviewed and include: SSI q 4 hours, semglee 8 units daily, IV protonix, IV abx, IV keppra, IV sodium phosphate 15 mmol once  Labs reviewed: creatinine 1.05, phosphorus 2.0, hemoglobin 8.7 CBG's: 209-270 x 24 hours  UOP: 835 ml x 24 hours I/O's: +2.3 L since admit  NUTRITION - FOCUSED PHYSICAL EXAM:  Flowsheet Row Most Recent Value  Orbital Region Mild depletion  Upper Arm Region No depletion  Thoracic and Lumbar Region No depletion  Buccal Region Unable to assess  Temple Region Mild depletion  Clavicle Bone Region Moderate depletion  Clavicle and Acromion Bone Region Moderate depletion  Scapular Bone Region Unable to assess  Dorsal Hand Moderate depletion  Patellar Region Moderate depletion  Anterior Thigh Region Severe depletion  Posterior Calf Region Severe depletion  Edema (RD Assessment) None  Hair Reviewed  Eyes Reviewed  Mouth Unable to assess  Skin Reviewed  Nails Reviewed       Diet Order:   Diet Order             Diet NPO time specified  Diet effective now  EDUCATION NEEDS:   Not appropriate for education at this time  Skin:  Skin Assessment: Skin Integrity Issues: Stage III: R heel, sacrum Other: non-pressure wound to RLE x 3, non-pressure wound to LLE x 3  Last BM:  no documented BM  Height:   Ht Readings from Last 1 Encounters:  07/07/21 5\' 1"  (1.549 m)    Weight:   Wt Readings from Last 1 Encounters:  07/07/21 67.6 kg     Ideal Body Weight:  47.7 kg  BMI:  28.17 kg/m^2  Estimated Nutritional Needs:   Kcal:  1600-1800  Protein:  90-110 grams  Fluid:  1.6-1.8 L    Gustavus Bryant, MS, RD, LDN Inpatient Clinical Dietitian Please see AMiON for contact information.

## 2021-07-21 NOTE — H&P (Signed)
NAME:  [name], MRN:  [id number], DOB:  [birth year], LOS: 1 ADMISSION DATE:  07/20/2021, CONSULTATION DATE:  07/20/21 REFERRING MD:  Gilford Raid - EM, CHIEF COMPLAINT:  unresponsiveness History of Present Illness:   54 yo F PMH sz, combined CHF with recovered EF, CKD III ,DM2, HTN, Afib, PVD, seizure  who presented to the ED 9/25 from nursing facility where the patient was found unresponsive. There is question of whether the patient had a seizure at the care facility but history from facility was very limited. While being transported with EMS, pt had a generalized sz. She received 5mg  Versed after which time she required BVM. In the ED pt still without adequate respiratory effort and was intubated. POC labs revealed undetectably low blood glucose for which pt was given d50.  In ED Pt also became hypotensive, a central line was placed and pt was started on NE. Neuro was consulted in the ED. CT H without acute abnormality -- stable small L frontal meningioma and chronic small vessel dz.   POC labs revealed Na 145 K 3.2 Co2 21 Cr 1.08 BUN 12 LA 1.1 WBC 7 hgb 9.7   Given K in ED   PCCM consulted for admission    Pertinent  Medical History  Seizure DM2 PVD Combined CHF, recovered EF  CKD  Afib    Significant Hospital Events: Including procedures, antibiotic start and stop dates in addition to other pertinent events   9/25 to ED from nursing facility for unresponsiveness. 5versed for sz with EMS. Intubated in ED. Admit to PCCM. Labile glucose, low K and Mag   Interim History / Subjective:  No overnight issues. EEG done overnight showed no seizures. Hyperglycemic this morning.   Objective   Blood pressure (!) 84/70, pulse 84, temperature (!) 97.4 F (36.3 C), temperature source Axillary, resp. rate 18, last menstrual period 05/19/2016, SpO2 100 %.    Vent Mode: PRVC FiO2 (%):  [30 %-40 %] 30 % Set Rate:  [18 bmp] 18 bmp Vt Set:  [380 mL] 380 mL PEEP:  [5 cmH20] 5  cmH20 Plateau Pressure:  [15 cmH20-18 cmH20] 18 cmH20   Intake/Output Summary (Last 24 hours) at 07/21/2021 5102 Last data filed at 07/21/2021 0900 Gross per 24 hour  Intake 3218.09 ml  Output 835 ml  Net 2383.09 ml   There were no vitals filed for this visit.  Examination: General: elderly acutely and chronically ill appearing woman HENT: NCAT pink mm ETT secure Lungs: CTAb symmetrical chest expansion, mechanically ventilated  Cardiovascular: RRR, frequent pvcs Abdomen: soft, nontender Extremities: thin, no edema Neuro: arouses to verbal stimuli, follows commands, moves all 4 extremities inconsistently GU: no foley  Resolved Hospital Problem list     Assessment & Plan:   Acute metabolic encephalopathy Seizure -hx sz (2021 associated with DKA) Variable Glu with instances of marked hypoglycemia. No acute finding on CT. ?infectious but labs otherwise not consistent with infection so far  -seizure possibly provoked by hypoglycemia, but story doesn't totally fit as Glu reportedly 120s with EMS when pt seized P -neuro consulted -EEG -Keppra -PRN ativan for SE -wean sedation in ICU while on LTM, RASS goal 0 -correct other metabolic abnormalities as indicated -RASS goal 0 -trend LA -acetaminophen and salicylate levels undetectable - UDS negative -check keppra level - in process  UTI - will start ceftriaxone, UA with +bacteria and WBC, cx pending  Acute respiratory failure  -intubated with apnea following versed admin in setting of seizure COVID positive --  positive 9/6 and again 9/25 P -Full MV support -WUA/SBT as indicated -VAP, PAD, pulm hygiene -PRN Cxr and gas  -does not need covid precautions. D/c covid precautions. Will plan for SBT today. If passes will consider extubation given no further seizures overnight.   Hypoglycemia, severe -Labile diabetes. . Brother says poor PO intake but per son went out to dinner, ate. Does have hx DM and has several meds on home  list -- ?accidental overdose.  P -tube feeds at goal, will stop D10 -cont dextrose gtt -start long acting insulin with SSI  Bradycardia -suspect due to metabolic abnormalities, hypothermia. Improved now.  P -Bair hugger -repalce K, mag  -ICU monitoring  Shock -?hypovolemia in setting of poor PO intake per brother Hx combined CHF with recovered EF P -ECHO - resolved, off pressors, responded to IVF early this morning  Hypokalemia Hypomagnesemia Hypocalcemia P -replace, trend  Malnutrition FTT Debility -baseline poor mobility, poor nutritional status wth extremity muscle wasting, hypoalbuminemia. Presenting from [place] when appropriate   Best Practice (right click and "Reselect all SmartList Selections" daily)   Diet/type: tubefeeds DVT prophylaxis: prophylactic heparin  GI prophylaxis: PPI Lines: Central line Foley:  N/A Code Status:  full code Last date of multidisciplinary goals of care discussion [will updated 9/26]  Labs   CBC: Recent Labs  Lab 07/20/21 1154 07/20/21 1203 07/20/21 1204 07/20/21 1238 07/21/21 0258  WBC 7.0  --   --   --  10.7*  HGB 9.7* 9.5* 9.5* 9.2* 8.7*  HCT 31.0* 28.0* 28.0* 27.0* 27.9*  MCV 92.0  --   --   --  91.8  PLT 368  --   --   --  676    Basic Metabolic Panel: Recent Labs  Lab 07/20/21 1154 07/20/21 1203 07/20/21 1204 07/20/21 1228 07/20/21 1238 07/20/21 1459 07/20/21 1919 07/21/21 0017 07/21/21 0258  NA 145 147* 147*  --  149* 144  --  136 137  K 3.2* 2.7* 2.7*  --  <2.0* 2.1* 3.9 4.5 4.2  CL 116* 111  --   --   --  115*  --  111 110  CO2 21*  --   --   --   --  24  --  21* 21*  GLUCOSE 90 74  --   --   --  169*  --  281* 286*  BUN 12 13  --   --   --  11  --  11 11  CREATININE 1.08* 1.00  --   --   --  1.10*  --  1.01* 1.05*  CALCIUM 7.8*  --   --   --   --  7.5*  --  7.2* 7.4*  MG  --   --   --  1.6*  --   --   --  2.1  --   PHOS  --   --   --   --   --   --   --   --  2.0*    GFR: Estimated Creatinine Clearance: 52 mL/min (A) (by C-G formula based on SCr of 1.05 mg/dL (H)). Recent Labs  Lab 07/20/21 1154 07/20/21 1156 07/20/21 1446 07/21/21 0258  PROCALCITON  --   --  0.54  --   WBC 7.0  --   --  10.7*  LATICACIDVEN  --  1.1  --   --     Liver Function Tests: Recent Labs  Lab 07/20/21 1154 07/20/21  1459  AST 19 14*  ALT <5 12  ALKPHOS 63 67  BILITOT 1.7* 0.5  PROT 5.0* 4.9*  ALBUMIN 1.5* 1.5*   Recent Labs  Lab 07/20/21 1446  LIPASE 21  AMYLASE 56   No results for input(s): AMMONIA in the last 168 hours.  ABG    Component Value Date/Time   PHART 7.406 07/20/2021 1238   PCO2ART 35.8 07/20/2021 1238   PO2ART 90 07/20/2021 1238   HCO3 22.5 07/20/2021 1238   TCO2 24 07/20/2021 1238   ACIDBASEDEF 2.0 07/20/2021 1238   O2SAT 97.0 07/20/2021 1238     Coagulation Profile: No results for input(s): INR, PROTIME in the last 168 hours.  Cardiac Enzymes: No results for input(s): CKTOTAL, CKMB, CKMBINDEX, TROPONINI in the last 168 hours.  HbA1C: Hgb A1c MFr Bld  Date/Time Value Ref Range Status  07/01/2021 05:18 PM 6.1 (H) 4.8 - 5.6 % Final    Comment:    (NOTE) Pre diabetes:          5.7%-6.4%  Diabetes:              >6.4%  Glycemic control for   <7.0% adults with diabetes   03/23/2021 05:00 AM 11.1 (H) 4.8 - 5.6 % Final    Comment:    (NOTE)         Prediabetes: 5.7 - 6.4         Diabetes: >6.4         Glycemic control for adults with diabetes: <7.0     CBG: Recent Labs  Lab 07/21/21 0138 07/21/21 0301 07/21/21 0400 07/21/21 0605 07/21/21 0728  GLUCAP 209* 255* 268* 270* 254*   Critical care time: 45 min    The patient is critically ill due to respiratory failure, seizures, encephalopathy.  Critical care was necessary to treat or prevent imminent or life-threatening deterioration.  Critical care was time spent personally by me on the following activities: development of treatment plan with patient and/or  surrogate as well as nursing, discussions with consultants, evaluation of patient's response to treatment, examination of patient, obtaining history from patient or surrogate, ordering and performing treatments and interventions, ordering and review of laboratory studies, ordering and review of radiographic studies, pulse oximetry, re-evaluation of patient's condition and participation in multidisciplinary rounds.   Critical Care Time devoted to patient care services described in this note is 45 minutes. This time reflects time of care of this [place] . This critical care time does not reflect separately billable procedures or procedure time, teaching time or supervisory time of PA/NP/Med student/Med Resident etc but could involve care discussion time.       Spero Geralds [facility] 07/21/2021 9:27 AM  Pager: see AMION  If no response to pager , please call critical care on call (see AMION) until 7pm After 7:00 pm call El[place]

## 2021-07-21 NOTE — Progress Notes (Signed)
I have advanced OG tube per Radiology recommendations from yesterday and per Dietary .Tube feedings off awaiting abdominal xray for placement

## 2021-07-21 NOTE — Progress Notes (Signed)
SBP 82 and I have stopped Fentanyl for wakeup assessment and notified Dr Shearon Stalls . Will continue to monitor.

## 2021-07-21 NOTE — Progress Notes (Signed)
  Echocardiogram 2D Echocardiogram has been performed.  [name] 07/21/2021, 3:20 PM

## 2021-07-21 NOTE — Progress Notes (Signed)
Subjective: Pt is markedly improved  Exam: Vitals:   07/21/21 0948 07/21/21 1000  BP:  (!) 88/72  Pulse:  85  Resp:  18  Temp:    SpO2: 100% 100%   Gen: In bed, NAD Resp: non-labored breathing, no acute distress Abd: soft, nt  Neuro: MS: Awake, interactive, follows commands CN: Pupils reactive bilaterally, visual fields full Motor: She follows commands in all four extremities Sensory: Endorses symmetric sensation to light touch  Pertinent Labs: Creatinine 1.05  Impression: 55 year old female with a history of seizure in the setting of severe hyperglycemia who presents with seizure in the setting of hypoglycemia.  Though there was a report of a normal blood glucose by EMS, the fact that it was unreadably low by the time of arrival to the emergency department because of the question the validity of their check.  I would consider this a provoked seizure due to hypoglycemia.  Given that it is unclear that she has ever had an unprovoked seizure, and she has never had epileptiform findings on an EEG, I think that weaning her Keppra could be reasonable.  We could decrease from 750 twice daily to 500 twice daily and then allow further titration downward as an outpatient.  Recommendations: 1) decrease Keppra to 500 mg twice daily 2) neurology to follow  Roland Rack, MD [facility] [phone]  If 7pm- 7am, please page neurology on call as listed in [place].

## 2021-07-21 NOTE — Progress Notes (Signed)
[facility] Progress Note Patient Name: SHAYLAH MCGHIE DOB: 27-Dec-1965 MRN: [id number]   Date of Service  07/21/2021  HPI/Events of Note  Received request for restraints as patient was attempting to pull ETT. No alarms on vent. Glucose now at 255  eICU Interventions  Bilateral soft wrist restraints ordered Discontinue D10 drip     Intervention Category Intermediate Interventions: Arrhythmia - evaluation and management Minor Interventions: Agitation / anxiety - evaluation and management  Judd Lien 07/21/2021, 4:04 AM

## 2021-07-21 NOTE — Consult Note (Signed)
WOC Nurse Consult Note: Reason for Consult: right heel ulcer Patient known to [facility] nursing team Patient known to have severe PAD, with recent stenting  Wound type: Arterial ulceration: right pretibial; 2cm x 1cm x0.1cm, 90% black/10% pink Unstagable pressure injury vs arterial ulceration; 4cm x 3cm x 0.1cm ; 95% black/5% yellow History of sacral pressure injury; removed from nursing flow sheets bc area is healed; completely re-epithelization  Pressure Injury POA: Yes/No/NA Measurement: see above  Wound bed: see above  Drainage (amount, consistency, odor) scant, serosanguinous  Periwound: intact  Dressing procedure/placement/frequency: Single layer of xeroform to the right pretibial and right achilles area wounds, top with dry dressing. Secure with kerlix. Change every other day Discussed POC with patient and bedside nurse.   Re consult if needed, will not follow at this time. Thanks  Alazae Crymes [employer], RN,CWOCN, CNS, [place] [phone])

## 2021-07-21 NOTE — Plan of Care (Signed)

## 2021-07-21 NOTE — Progress Notes (Signed)
e[facility] Physician-Brief Progress Note Patient Name: KELSE PLOCH DOB: [birth year] MRN: [id number]   Date of Service  07/21/2021  HPI/Events of Note  Notified of SBP 78, MAP 70 Oliguric Tolerating tube feeding  eICU Interventions  Ordered 500 cc LR bolus     Intervention Category Intermediate Interventions: Hypotension - evaluation and management;Oliguria - evaluation and management  Judd Lien 07/21/2021, 5:48 AM

## 2021-07-21 NOTE — Procedures (Signed)
Extubation Procedure Note  Patient Details:   Name: [name] DOB: Apr 09, 1966 MRN: [id number]   Airway Documentation:    Vent end date: 07/21/21 Vent end time: 1808   Evaluation  O2 sats: stable throughout Complications: No apparent complications Patient did tolerate procedure well. Bilateral Breath Sounds: Clear, Diminished   Yes  Pt was extubated and is stable on 4L . Cuff leak was heard and no stridor is present. RT will continue to monitor. [name] Jan 07/21/2021, 6:09 PM

## 2021-07-22 DIAGNOSIS — E162 Hypoglycemia, unspecified: Secondary | ICD-10-CM | POA: Diagnosis not present

## 2021-07-22 DIAGNOSIS — R569 Unspecified convulsions: Secondary | ICD-10-CM | POA: Diagnosis not present

## 2021-07-22 DIAGNOSIS — E0859 Diabetes mellitus due to underlying condition with other circulatory complications: Secondary | ICD-10-CM

## 2021-07-22 DIAGNOSIS — E876 Hypokalemia: Secondary | ICD-10-CM | POA: Diagnosis not present

## 2021-07-22 LAB — CBC WITH DIFFERENTIAL/PLATELET
Abs Immature Granulocytes: 0.09 10*3/uL — ABNORMAL HIGH (ref 0.00–0.07)
Basophils Absolute: 0.1 10*3/uL (ref 0.0–0.1)
Basophils Relative: 1 %
Eosinophils Absolute: 0.5 10*3/uL (ref 0.0–0.5)
Eosinophils Relative: 4 %
HCT: 23.6 % — ABNORMAL LOW (ref 36.0–46.0)
Hemoglobin: 7 g/dL — ABNORMAL LOW (ref 12.0–15.0)
Immature Granulocytes: 1 %
Lymphocytes Relative: 14 %
Lymphs Abs: 1.8 10*3/uL (ref 0.7–4.0)
MCH: 28 pg (ref 26.0–34.0)
MCHC: 29.7 g/dL — ABNORMAL LOW (ref 30.0–36.0)
MCV: 94.4 fL (ref 80.0–100.0)
Monocytes Absolute: 1.1 10*3/uL — ABNORMAL HIGH (ref 0.1–1.0)
Monocytes Relative: 8 %
Neutro Abs: 9.5 10*3/uL — ABNORMAL HIGH (ref 1.7–7.7)
Neutrophils Relative %: 72 %
Platelets: 272 10*3/uL (ref 150–400)
RBC: 2.5 MIL/uL — ABNORMAL LOW (ref 3.87–5.11)
RDW: 16.6 % — ABNORMAL HIGH (ref 11.5–15.5)
WBC: 13.1 10*3/uL — ABNORMAL HIGH (ref 4.0–10.5)
nRBC: 0.2 % (ref 0.0–0.2)

## 2021-07-22 LAB — BASIC METABOLIC PANEL
Anion gap: 5 (ref 5–15)
BUN: 22 mg/dL — ABNORMAL HIGH (ref 6–20)
CO2: 22 mmol/L (ref 22–32)
Calcium: 7.5 mg/dL — ABNORMAL LOW (ref 8.9–10.3)
Chloride: 112 mmol/L — ABNORMAL HIGH (ref 98–111)
Creatinine, Ser: 1.12 mg/dL — ABNORMAL HIGH (ref 0.44–1.00)
GFR, Estimated: 57 mL/min — ABNORMAL LOW (ref >60)
Glucose, Bld: 125 mg/dL — ABNORMAL HIGH (ref 70–99)
Potassium: 4.5 mmol/L (ref 3.5–5.1)
Sodium: 139 mmol/L (ref 135–145)

## 2021-07-22 LAB — GLUCOSE, CAPILLARY
Glucose-Capillary: 114 mg/dL — ABNORMAL HIGH (ref 70–99)
Glucose-Capillary: 90 mg/dL (ref 70–99)
Glucose-Capillary: 95 mg/dL (ref 70–99)
Glucose-Capillary: 105 mg/dL — ABNORMAL HIGH (ref 70–99)
Glucose-Capillary: 141 mg/dL — ABNORMAL HIGH (ref 70–99)

## 2021-07-22 LAB — MAGNESIUM: Magnesium: 1.9 mg/dL (ref 1.7–2.4)

## 2021-07-22 LAB — PHOSPHORUS: Phosphorus: 2.9 mg/dL (ref 2.5–4.6)

## 2021-07-22 MED ORDER — THIAMINE HCL 100 MG PO TABS
100.0000 mg | ORAL_TABLET | Freq: Every day | ORAL | Status: DC
  Administered 2021-07-23 – 2021-08-02 (×11): 100 mg via ORAL
  Filled 2021-07-22 (×11): qty 1

## 2021-07-22 MED ORDER — THIAMINE HCL 100 MG/ML IJ SOLN
500.0000 mg | Freq: Once | INTRAVENOUS | Status: AC
  Administered 2021-07-22: 500 mg via INTRAVENOUS
  Filled 2021-07-22: qty 4

## 2021-07-22 MED ORDER — APIXABAN 5 MG PO TABS
5.0000 mg | ORAL_TABLET | Freq: Two times a day (BID) | ORAL | Status: DC
  Administered 2021-07-22 – 2021-08-02 (×21): 5 mg via ORAL
  Filled 2021-07-22 (×22): qty 1

## 2021-07-22 MED ORDER — PANTOPRAZOLE SODIUM 40 MG PO TBEC
40.0000 mg | DELAYED_RELEASE_TABLET | Freq: Every day | ORAL | Status: DC
  Administered 2021-07-22 – 2021-08-02 (×12): 40 mg via ORAL
  Filled 2021-07-22 (×12): qty 1

## 2021-07-22 MED ORDER — ASPIRIN EC 81 MG PO TBEC
81.0000 mg | DELAYED_RELEASE_TABLET | Freq: Every day | ORAL | Status: DC
  Administered 2021-07-22 – 2021-08-02 (×12): 81 mg via ORAL
  Filled 2021-07-22 (×12): qty 1

## 2021-07-22 MED ORDER — FLUOXETINE HCL 20 MG PO CAPS
20.0000 mg | ORAL_CAPSULE | Freq: Every day | ORAL | Status: DC
  Administered 2021-07-22 – 2021-08-02 (×12): 20 mg via ORAL
  Filled 2021-07-22 (×12): qty 1

## 2021-07-22 MED ORDER — OXYBUTYNIN CHLORIDE ER 5 MG PO TB24
5.0000 mg | ORAL_TABLET | Freq: Every day | ORAL | Status: DC
  Administered 2021-07-23 – 2021-08-02 (×11): 5 mg via ORAL
  Filled 2021-07-22 (×11): qty 1

## 2021-07-22 MED ORDER — ROSUVASTATIN CALCIUM 5 MG PO TABS
10.0000 mg | ORAL_TABLET | Freq: Every evening | ORAL | Status: DC
  Administered 2021-07-22 – 2021-08-01 (×10): 10 mg via ORAL
  Filled 2021-07-22 (×6): qty 2
  Filled 2021-07-22: qty 1
  Filled 2021-07-22 (×4): qty 2
  Filled 2021-07-22: qty 1

## 2021-07-22 MED ORDER — MAGNESIUM SULFATE 2 GM/50ML IV SOLN
2.0000 g | Freq: Once | INTRAVENOUS | Status: AC
  Administered 2021-07-22: 2 g via INTRAVENOUS
  Filled 2021-07-22: qty 50

## 2021-07-22 MED ORDER — ACETAMINOPHEN 650 MG RE SUPP: 650.0000 mg | Freq: Four times a day (QID) | RECTAL | Status: DC | PRN

## 2021-07-22 MED ORDER — BENZTROPINE MESYLATE 1 MG PO TABS
0.5000 mg | ORAL_TABLET | Freq: Two times a day (BID) | ORAL | Status: DC
  Administered 2021-07-22 – 2021-08-02 (×21): 0.5 mg via ORAL
  Filled 2021-07-22 (×23): qty 1

## 2021-07-22 NOTE — Evaluation (Signed)
Clinical/Bedside Swallow Evaluation Patient Details  Name: [name] MRN: [id number] Date of Birth: 11/13/1965  Today's Date: 07/22/2021 Time: SLP Start Time (ACUTE ONLY): 0907 SLP Stop Time (ACUTE ONLY): 0930 SLP Time Calculation (min) (ACUTE ONLY): 23 min  Past Medical History:  Past Medical History:  Diagnosis Date   Alcohol use    Ankle fracture, lateral malleolus, closed 2013   Anxiety    Aortic atherosclerosis ([place]) 03/10/2021   Breast mass, left 2013   CHF (congestive heart failure) ([place])    a. EF 20-25% by echo in 05/2016 with cath showing normal cors b. EF 50-55% in 07/2020 c. 01/2021: EF at 55-60% with moderate LVH   Chronic anemia    CKD (chronic kidney disease), stage III (HCC)    Cocaine abuse (HCC)    COPD (chronic obstructive pulmonary disease) ([place])    Diabetes mellitus, type 2 ([place])    Diabetic Charcot foot ([place])    Essential hypertension    History of cardiomyopathy    History of GI bleed 2011   Hyperlipidemia    Noncompliance    Obesity    PAF (paroxysmal atrial fibrillation) (HCC)    Panic attacks    PAT (paroxysmal atrial tachycardia) (HCC)    Previously on Amiodarone   Seizures ([place]) 10/01/2020   Sleep apnea    Not on CPAP   Stroke ([place]) 2018   Tobacco abuse    Urinary incontinence    Past Surgical History:  Past Surgical History:  Procedure Laterality Date   ANGIOPLASTY ILLIAC ARTERY Right 04/23/2021   Procedure: ANGIOPLASTY AND STENT SUPERFICIAL FEMORAL ARTERY;  Surgeon: Cherre Robins, MD;  Location: [facility];  Service: Vascular;  Laterality: Right;   AORTOGRAM  04/23/2021   Procedure: AORTOGRAM;  Surgeon: Cherre Robins, MD;  Location: [facility];  Service: Vascular;;   AORTOGRAM Left 04/30/2021   Procedure: left lower extremity angiogram with second order cannulation;  Surgeon: Cherre Robins, MD;  Location: [facility];  Service: Vascular;  Laterality: Left;   BIOPSY  11/12/2020   Procedure: BIOPSY;  Surgeon: Harvel Quale, MD;   [facility];  Service: Gastroenterology;;   BREAST BIOPSY     CARDIAC CATHETERIZATION N/A 07/28/2016   Procedure: Left Heart Cath and Coronary Angiography;  Surgeon: Jettie Booze, MD;  Location: [facility];  Service: Cardiovascular;  Laterality: N/A;   COLONOSCOPY N/A 05/10/2019   Procedure: COLONOSCOPY;  Surgeon: Danie Binder, MD;  [facility];  Service: Endoscopy;  Laterality: N/A;  Phenergan 12.5 mg IV in pre-op   DILATION AND CURETTAGE OF UTERUS     ESOPHAGOGASTRODUODENOSCOPY (EGD) WITH PROPOFOL N/A 11/12/2020   Procedure: ESOPHAGOGASTRODUODENOSCOPY (EGD) WITH PROPOFOL;  Surgeon: Harvel Quale, MD;  [facility];  Service: Gastroenterology;  Laterality: N/A;   I & D EXTREMITY Bilateral 09/22/2017   Procedure: BILATERAL DEBRIDEMENT LEG/FOOT ULCERS, APPLY VERAFLO WOUND VAC;  Surgeon: Newt Minion, MD;  Location: [facility];  Service: Orthopedics;  Laterality: Bilateral;   I & D EXTREMITY Right 10/11/2018   Procedure: IRRIGATION AND DEBRIDEMENT RIGHT HAND;  Surgeon: [name], MD;  Location: [facility];  Service: Orthopedics;  Laterality: Right;   I & D EXTREMITY Right 10/13/2018   Procedure: REPEAT IRRIGATION AND DEBRIDEMENT RIGHT HAND;  Surgeon: [name], MD;  Location: [facility];  Service: Orthopedics;  Laterality: Right;   I & D EXTREMITY Right 11/22/2018   Procedure: IRRIGATION AND DEBRIDEMENT AND PINNING RIGHT HAND;  Surgeon: [name], MD;  Location: [facility];  Service: Orthopedics;  Laterality: Right;   IR RADIOLOGIST EVAL & MGMT  07/05/2018   LOWER EXTREMITY ANGIOGRAM Bilateral 04/23/2021   Procedure: RIGHT  AND LEFT LOWER EXTREMITY ANGIOGRAM;  Surgeon: Cherre Robins, MD;  Location: [facility];  Service: Vascular;  Laterality: Bilateral;   POLYPECTOMY  05/10/2019   Procedure: POLYPECTOMY;  Surgeon: Danie Binder, MD;  [facility];  Service: Endoscopy;;   SKIN SPLIT GRAFT Bilateral 09/28/2017   Procedure:  BILATERAL SPLIT THICKNESS SKIN GRAFT LEGS/FEET AND APPLY VAC;  Surgeon: Newt Minion, MD;  Location: [facility];  Service: Orthopedics;  Laterality: Bilateral;   SKIN SPLIT GRAFT Right 11/22/2018   Procedure: SKIN GRAFT SPLIT THICKNESS;  Surgeon: [name], MD;  Location: [facility];  Service: Orthopedics;  Laterality: Right;   HPI:  55 year old female with a history of seizure in the setting of severe hyperglycemia who presents with seizure in the setting of hypoglycemia.  ETT 9/25-9/26.    Assessment / Plan / Recommendation  Clinical Impression  Pts swallow appeared grossly functional this date. Some generalized weakness noted of oral musculture. Dentures were at bedside and placed. Pt not always cooperative during assessment, initially refusing positioning upright in bed (RN later assisted with pt agreeable). Pt consumed thin liquids via straw with suspected delay in swallow. Delayed throat clear noted x1. No overt coughing exhibited. Pt with prolonged mastication of solids though with extended time exhibited adequate oral clearance. Recommend regular thin liquid diet with meds as tolerated and full supervision. Will continue to closely follow, if any overt clinical difficulty exhibited pt may benefit from instrumental assessment. SLP Visit Diagnosis: Dysphagia, unspecified (R13.10)    Aspiration Risk  Mild aspiration risk    Diet Recommendation   Regular, thin liquids  Medication Administration: Whole meds with liquid    Other  Recommendations Oral Care Recommendations: Oral care BID    Recommendations for follow up therapy are one component of a multi-disciplinary discharge planning process, led by the attending physician.  Recommendations may be updated based on patient status, additional functional criteria and insurance authorization.  Follow up Recommendations None      Frequency and Duration min 2x/week  2 weeks       Prognosis Prognosis for Safe Diet Advancement:  Fair Barriers to Reach Goals: Behavior      Swallow Study   General Date of Onset: 07/20/21 HPI: 55 year old female with a history of seizure in the setting of severe hyperglycemia who presents with seizure in the setting of hypoglycemia.  ETT 9/25-9/26. Type of Study: Bedside Swallow Evaluation Previous Swallow Assessment: November 2021 with recs for puree/thin, without dentures in place Diet Prior to this Study: NPO Temperature Spikes Noted: No Respiratory Status: Nasal cannula History of Recent Intubation: Yes Length of Intubations (days): 1 days Date extubated: 07/21/21 Behavior/Cognition: Alert;Uncooperative Oral Cavity Assessment: Dry Oral Care Completed by SLP: Yes Oral Cavity - Dentition: Dentures, top;Dentures, bottom Vision: Functional for self-feeding Self-Feeding Abilities: Needs set up Patient Positioning: Upright in bed (initially refused upright positioning, RN assisted with pt repositioned fully upright) Baseline Vocal Quality: Low vocal intensity Volitional Cough: Weak Volitional Swallow: Able to elicit    Oral/Motor/Sensory Function Overall Oral Motor/Sensory Function: Generalized oral weakness   Ice Chips Ice chips: Impaired Presentation: Spoon Oral Phase Functional Implications: Prolonged oral transit Pharyngeal Phase Impairments: Suspected delayed Swallow;Multiple swallows   Thin Liquid Thin Liquid: Impaired Presentation: Straw Oral Phase Functional Implications: Prolonged oral transit  Pharyngeal  Phase Impairments: Suspected delayed Swallow;Multiple swallows;Throat Clearing - Delayed    Nectar Thick Nectar Thick Liquid: Not tested   Honey Thick Honey Thick Liquid: Not tested   Puree Puree:  (pt refused despite encouragement)   Solid     Solid: Impaired Presentation: Self Fed Oral Phase Impairments: Impaired mastication Oral Phase Functional Implications: Prolonged oral transit;Oral residue Pharyngeal Phase Impairments: Suspected delayed  Swallow;Multiple swallows      Hayden Rasmussen MA, CCC-SLP Acute Rehabilitation Services   07/22/2021,9:46 AM

## 2021-07-22 NOTE — Progress Notes (Signed)
Mg 1.9 Replaced per protocol

## 2021-07-22 NOTE — Progress Notes (Addendum)
NAME:  [name], MRN:  [id number], DOB:  01/26/1966, LOS: 2 ADMISSION DATE:  07/20/2021, CONSULTATION DATE:  07/20/21 REFERRING MD:  Gilford Raid - EM, CHIEF COMPLAINT:  unresponsiveness History of Present Illness:   55 yo F PMH sz, combined CHF with recovered EF, CKD III ,DM2, HTN, Afib, PVD, seizure  who presented to the ED 9/25 from nursing facility where the patient was found unresponsive. There is question of whether the patient had a seizure at the care facility but history from facility was very limited. While being transported with EMS, pt had a generalized sz. She received 5mg  Versed after which time she required BVM. In the ED pt still without adequate respiratory effort and was intubated. POC labs revealed undetectably low blood glucose for which pt was given d50.  In ED Pt also became hypotensive, a central line was placed and pt was started on NE. Neuro was consulted in the ED. CT H without acute abnormality -- stable small L frontal meningioma and chronic small vessel dz.   POC labs revealed Na 145 K 3.2 Co2 21 Cr 1.08 BUN 12 LA 1.1 WBC 7 hgb 9.7   Given K in ED   PCCM consulted for admission    Pertinent  Medical History  Seizure DM2 PVD Combined CHF, recovered EF  CKD  Afib    Significant Hospital Events: Including procedures, antibiotic start and stop dates in addition to other pertinent events   9/25 to ED from nursing facility for unresponsiveness. 5versed for sz with EMS. Intubated in ED. Admit to PCCM. Labile glucose, low K and Mag   Interim History / Subjective:  No overnight issues. EEG done overnight showed no seizures. Hyperglycemic this morning.   Objective   Blood pressure (!) 122/94, pulse 88, temperature 98 F (36.7 C), temperature source Oral, resp. rate 17, last menstrual period 05/19/2016, SpO2 100 %.    Vent Mode: PSV;CPAP FiO2 (%):  [30 %] 30 % Set Rate:  [18 bmp] 18 bmp Vt Set:  [380 mL] 380 mL PEEP:  [5 cmH20] 5 cmH20 Plateau  Pressure:  [16 cmH20] 16 cmH20   Intake/Output Summary (Last 24 hours) at 07/22/2021 0959 Last data filed at 07/22/2021 0900 Gross per 24 hour  Intake 1086.7 ml  Output 565 ml  Net 521.7 ml   There were no vitals filed for this visit.  Examination: General: elderly acutely and chronically ill appearing woman Lungs: ctab no wheezes or crackles Cardiovascular: RRR no mrg Abdomen: soft, nontender Extremities: thin, no edema Neuro: Aox3, no focal deficits. Globally weak. Normal speech,  Psych: disagreeable MSK: bilateral lower extremity venous stasis ulcers, POA  Resolved Hospital Problem list    Respiratory failure Bradycardia from hypothermia Shock  Assessment & Plan:   Acute metabolic encephalopathy Seizure P -neuro consulted. EEG without further seizures. Felt to be hypoglycemic in nature - unclear if she was still taking keppra at [facility] - discussed with neuro, can taper off keppra as outpatient if she has not been getting it.   UTI - ceftriaxone, UA with +bacteria and WBC, cx pending - colonization in the past  Type 1 DM -Labile diabetes.  - continue long acting 8 units daily - sensitive SSI - diabetic diet  Hypokalemia Hypomagnesemia Hypocalcemia P -replace, trend. Repeat BMP pending  Malnutrition FTT Debility -baseline poor mobility, poor nutritional status wth extremity muscle wasting, hypoalbuminemia. Presenting from [place] ordered  Best Practice (right click and "Reselect all SmartList Selections" daily)  Diet/type: tubefeeds DVT prophylaxis: prophylactic heparin  GI prophylaxis: PPI Lines: Central line Foley:  N/A Code Status:  full code Last date of multidisciplinary goals of care discussion [pending]  Ok for transfer out of Icu to med surg. TRH to assume care 9/28  Labs   CBC: Recent Labs  Lab 07/20/21 1154 07/20/21 1203 07/20/21 1204 07/20/21 1238 07/21/21 0258 07/22/21 0330  WBC 7.0  --   --   --   10.7* 13.1*  NEUTROABS  --   --   --   --   --  9.5*  HGB 9.7* 9.5* 9.5* 9.2* 8.7* 7.0*  HCT 31.0* 28.0* 28.0* 27.0* 27.9* 23.6*  MCV 92.0  --   --   --  91.8 94.4  PLT 368  --   --   --  334 240    Basic Metabolic Panel: Recent Labs  Lab 07/20/21 1154 07/20/21 1203 07/20/21 1204 07/20/21 1228 07/20/21 1238 07/20/21 1459 07/20/21 1919 07/21/21 0017 07/21/21 0258 07/21/21 1838 07/22/21 0330  NA 145 147*   < >  --  149* 144  --  136 137  --  139  K 3.2* 2.7*   < >  --  <2.0* 2.1* 3.9 4.5 4.2  --  4.5  CL 116* 111  --   --   --  115*  --  111 110  --  112*  CO2 21*  --   --   --   --  24  --  21* 21*  --  22  GLUCOSE 90 74  --   --   --  169*  --  281* 286*  --  125*  BUN 12 13  --   --   --  11  --  11 11  --  22*  CREATININE 1.08* 1.00  --   --   --  1.10*  --  1.01* 1.05*  --  1.12*  CALCIUM 7.8*  --   --   --   --  7.5*  --  7.2* 7.4*  --  7.5*  MG  --   --   --  1.6*  --   --   --  2.1  --  1.9 1.9  PHOS  --   --   --   --   --   --   --   --  2.0* 2.9 2.9   < > = values in this interval not displayed.   GFR: CrCl cannot be calculated (Unknown ideal weight.). Recent Labs  Lab 07/20/21 1154 07/20/21 1156 07/20/21 1446 07/21/21 0258 07/22/21 0330  PROCALCITON  --   --  0.54  --   --   WBC 7.0  --   --  10.7* 13.1*  LATICACIDVEN  --  1.1  --   --   --     Liver Function Tests: Recent Labs  Lab 07/20/21 1154 07/20/21 1459  AST 19 14*  ALT <5 12  ALKPHOS 63 67  BILITOT 1.7* 0.5  PROT 5.0* 4.9*  ALBUMIN 1.5* 1.5*   Recent Labs  Lab 07/20/21 1446  LIPASE 21  AMYLASE 56   No results for input(s): AMMONIA in the last 168 hours.  ABG    Component Value Date/Time   PHART 7.406 07/20/2021 1238   PCO2ART 35.8 07/20/2021 1238   PO2ART 90 07/20/2021 1238   HCO3 22.5 07/20/2021 1238   TCO2 24 07/20/2021 1238   ACIDBASEDEF 2.0 07/20/2021 1238  O2SAT 97.0 07/20/2021 1238     Coagulation Profile: No results for input(s): INR, PROTIME in the last 168  hours.  Cardiac Enzymes: No results for input(s): CKTOTAL, CKMB, CKMBINDEX, TROPONINI in the last 168 hours.  HbA1C: Hgb A1c MFr Bld  Date/Time Value Ref Range Status  07/01/2021 05:18 PM 6.1 (H) 4.8 - 5.6 % Final    Comment:    (NOTE) Pre diabetes:          5.7%-6.4%  Diabetes:              >6.4%  Glycemic control for   <7.0% adults with diabetes   03/23/2021 05:00 AM 11.1 (H) 4.8 - 5.6 % Final    Comment:    (NOTE)         Prediabetes: 5.7 - 6.4         Diabetes: >6.4         Glycemic control for adults with diabetes: <7.0     CBG: Recent Labs  Lab 07/21/21 1525 07/21/21 1926 07/21/21 2334 07/22/21 0335 07/22/21 0737  GLUCAP 310* 200* 87 114* 90

## 2021-07-22 NOTE — Progress Notes (Signed)
PT Cancellation Note  Patient Details Name: [name] MRN: [id number] DOB: [birth year]   Cancelled Treatment:    Reason Eval/Treat Not Completed: PT screened, no needs identified, will sign off (pt long term SNF resident, total care at baseline with lift for OOB. Pt confirmed this along with chart. Pt is currently assisting with rolling and at baseline function. No acute needs will sign off)   Levette Paulick B Usman Millett 07/22/2021, 12:51 PM Bayard Males, PT Acute Rehabilitation Services Pager: [phone] Office: [phone]

## 2021-07-22 NOTE — Progress Notes (Signed)
eLink Physician-Brief Progress Note Patient Name: [name] DOB: [birth year] MRN: [id number]   Date of Service  07/22/2021  HPI/Events of Note  Patient intermittently screaming stating she is having bilateral lower leg pain (She has multiple wounds on leg)  Remains NPO as recently extubated  eICU Interventions  Ordered Tylenol suppository prn pain     Intervention Category Minor Interventions: Routine modifications to care plan (e.g. PRN medications for pain, fever)  Shona Needles Nicolena Schurman 07/22/2021, 1:22 AM

## 2021-07-22 NOTE — Progress Notes (Signed)
Subjective: Pt remains improved, though had some delirium overnight.   Exam: Vitals:   07/22/21 1000 07/22/21 1002  BP:  (!) 106/92  Pulse: 100 90  Resp: 20 18  Temp:    SpO2: 98% 92%   Gen: In bed, NAD Resp: non-labored breathing, no acute distress Abd: soft, nt  Neuro: MS: Awake, interactive, knows she is in the hospital, but thinks [place], gives month as June and year as 2016 CN: Pupils reactive bilaterally, visual fields full Motor: She follows commands in all four extremities Sensory: Endorses symmetric sensation to light touch  Pertinent Labs: Creatinine 1.12  Impression: 55 year old female with a history of seizure in the setting of severe hyperglycemia who presents with seizure in the setting of hypoglycemia.  It is not clear that she has ever had a truly unprovoked seizure and therefore I would favor gradually tapering off her Keppra, though after the initial decrease to 500 twice daily I will defer to her outpatient neurology team.  She is mildly confused, I suspect ICU related delirium, but with her history of malnutrition I would favor thiamine supplementation as well.  Recommendations: 1) continue decreased Keppra to 500 mg twice daily 2) thiamine supplementation 3) neurology will be available on an as-needed basis. 4) outpatient neurology follow-up.  Roland Rack, MD [facility] [phone]  If 7pm- 7am, please page neurology on call as listed in [place].

## 2021-07-22 NOTE — Progress Notes (Signed)
Phyllis,RN made aware of the discontinue CVC order, she will remove the line.  IVT placed new PIV.  Delilah Shan Abbi Mancini,RN-VAST

## 2021-07-23 DIAGNOSIS — J9621 Acute and chronic respiratory failure with hypoxia: Secondary | ICD-10-CM | POA: Diagnosis not present

## 2021-07-23 DIAGNOSIS — Z7189 Other specified counseling: Secondary | ICD-10-CM | POA: Diagnosis not present

## 2021-07-23 DIAGNOSIS — Z515 Encounter for palliative care: Secondary | ICD-10-CM

## 2021-07-23 DIAGNOSIS — S81801D Unspecified open wound, right lower leg, subsequent encounter: Secondary | ICD-10-CM

## 2021-07-23 DIAGNOSIS — E162 Hypoglycemia, unspecified: Secondary | ICD-10-CM | POA: Diagnosis not present

## 2021-07-23 DIAGNOSIS — S81802D Unspecified open wound, left lower leg, subsequent encounter: Secondary | ICD-10-CM

## 2021-07-23 DIAGNOSIS — R092 Respiratory arrest: Secondary | ICD-10-CM | POA: Diagnosis not present

## 2021-07-23 DIAGNOSIS — R569 Unspecified convulsions: Secondary | ICD-10-CM | POA: Diagnosis not present

## 2021-07-23 LAB — GLUCOSE, CAPILLARY
Glucose-Capillary: 126 mg/dL — ABNORMAL HIGH (ref 70–99)
Glucose-Capillary: 128 mg/dL — ABNORMAL HIGH (ref 70–99)
Glucose-Capillary: 171 mg/dL — ABNORMAL HIGH (ref 70–99)
Glucose-Capillary: 199 mg/dL — ABNORMAL HIGH (ref 70–99)
Glucose-Capillary: 74 mg/dL (ref 70–99)
Glucose-Capillary: 94 mg/dL (ref 70–99)
Glucose-Capillary: 96 mg/dL (ref 70–99)

## 2021-07-23 LAB — LEVETIRACETAM LEVEL: Levetiracetam Lvl: 56.2 ug/mL — ABNORMAL HIGH (ref 10.0–40.0)

## 2021-07-23 MED ORDER — ENSURE ENLIVE PO LIQD
237.0000 mL | Freq: Three times a day (TID) | ORAL | Status: DC
  Administered 2021-07-24 – 2021-08-02 (×18): 237 mL via ORAL
  Filled 2021-07-23: qty 237

## 2021-07-23 MED ORDER — ADULT MULTIVITAMIN W/MINERALS CH
1.0000 | ORAL_TABLET | Freq: Every day | ORAL | Status: DC
  Administered 2021-07-23 – 2021-08-02 (×11): 1 via ORAL
  Filled 2021-07-23 (×11): qty 1

## 2021-07-23 MED ORDER — ENSURE ENLIVE PO LIQD: 237.0000 mL | Freq: Two times a day (BID) | ORAL | Status: DC

## 2021-07-23 NOTE — Progress Notes (Signed)
OT Cancellation Note  Patient Details Name: [name] MRN: [id number] DOB: Jul 07, 1966   Cancelled Treatment:    Reason Eval/Treat Not Completed: OT screened, no needs identified, will sign off. Pt long term SNF resident, total care at baseline with lift for OOB. Pt confirmed this along with chart. No acute needs and will sign off.   [place] 07/23/2021, 7:57 AM  Jesse Sans OTR/L Acute Rehabilitation Services Pager: [phone] Office: [phone]

## 2021-07-23 NOTE — Consult Note (Signed)
Palliative Care Consult Note                                  Date: 07/23/2021   Patient Name: [name]  DOB: 06-03-1966  MRN: [id number]  Age / Sex: 55 y.o., female  PCP: Jodi Marble, MD Referring Physician: Oswald Hillock, MD  Reason for Consultation: Establishing goals of care  HPI/Patient Profile: 55 y.o. female  with past medical history of tobacco use disorder, severe PVD, paroxysmal atrial fibrillation currently on anticoagulant, hyperlipidemia, hypertension, uncontrolled T2DM, CHF, CKD admitted on 07/20/2021 from the nursing home after being found unresponsive. Question of seizure at the facility. Patient did have a generalized seizure with EMS, required intubation in the ED. Central line placed for hypotension and pressors. CT head without acute abnormality. EEG no further seizures, likely due to profound hypoglycemia. Noted baseline of poor nutritional status and muscle wasting.  PMT was consulted for [place].  Past Medical History:  Diagnosis Date   Alcohol use    Ankle fracture, lateral malleolus, closed 2013   Anxiety    Aortic atherosclerosis ([place]) 03/10/2021   Breast mass, left 2013   CHF (congestive heart failure) ([place])    a. EF 20-25% by echo in 05/2016 with cath showing normal cors b. EF 50-55% in 07/2020 c. 01/2021: EF at 55-60% with moderate LVH   Chronic anemia    CKD (chronic kidney disease), stage III (HCC)    Cocaine abuse (HCC)    COPD (chronic obstructive pulmonary disease) ([place])    Diabetes mellitus, type 2 ([place])    Diabetic Charcot foot ([place])    Essential hypertension    History of cardiomyopathy    History of GI bleed 2011   Hyperlipidemia    Noncompliance    Obesity    PAF (paroxysmal atrial fibrillation) (HCC)    Panic attacks    PAT (paroxysmal atrial tachycardia) (HCC)    Previously on Amiodarone   Seizures ([place]) 10/01/2020   Sleep apnea    Not on CPAP   Stroke ([place]) 2018   Tobacco  abuse    Urinary incontinence     Subjective:   This NP Walden Field reviewed medical records, received report from team, assessed the patient and then meet at the patient's bedside to discuss diagnosis, prognosis, GOC, EOL wishes disposition and options.  I met with the patient at the bedside.   Concept of Palliative Care was introduced as specialized medical care for people and their families living with serious illness.  If focuses on providing relief from the symptoms and stress of a serious illness.  The goal is to improve quality of life for both the patient and the family. Values and goals of care important to patient and family were attempted to be elicited.  Created space and opportunity for patient  and family to explore thoughts and feelings regarding current medical situation   Natural trajectory and current clinical status were discussed. Questions and concerns addressed. Patient  encouraged to call with questions or concerns.    Patient/Family Understanding of Illness: The patient understands she has a lot of chronic health problems. Still getting wound care to leg ulcers. Stil adamant about no amputation and no tube feeding, even if it means the end of her life. Knows she had a seizure and needed a breathing tube and central line.   Life Review: The patient states she lives alone.  Her beloved [place] recently passed away.  Her son lives about 30 minutes away in [place] and visits as often as he can, daughter lives in [place] and does not visit very much.  Sense of a strained relationship was felt.  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  Her hobbies include singing, playing with Ambreen, and spending time with family including her 5 grandkids.  1 grandson in particular she refers to as "my little sunflower".  Patient Values: Primarily family  Today's Discussion: Today we reviewed previously  documented MOST form directives: full scope of care EXCEPT no amputation and no tube feedings. She confirms these are still her wishes. She states she would be ok if they needed to put a breathing tube back down her throat or needed to put in a central line again, stating "keep me alive as long as possible." We discussed her son and daughter, son still visiting with her. States "he keeps me worried". We discussed pending transfer to the floor and she hopes this will allow her to sleep a bit better. Offered active listening, emotional support. Answered all questions, addressed all concerns.  Review of Systems  Respiratory:  Positive for cough. Negative for shortness of breath.   Gastrointestinal:  Negative for abdominal pain, nausea and vomiting.  Musculoskeletal:        Admits leag heaviness/weakness   Objective:   Primary Diagnoses: Present on Admission:  Acute on chronic respiratory failure with hypoxia [facility])   Physical Exam Vitals and nursing note reviewed.  Constitutional:      General: She is not in acute distress.    Appearance: She is obese. She is ill-appearing.  HENT:     Head: Normocephalic and atraumatic.  Cardiovascular:     Rate and Rhythm: Normal rate.  Pulmonary:     Effort: Pulmonary effort is normal. No respiratory distress.     Breath sounds: Normal breath sounds. No wheezing or rhonchi.  Abdominal:     General: Abdomen is protuberant.     Palpations: Abdomen is soft.  Skin:    General: Skin is warm and dry.  Neurological:     Mental Status: She is alert.    Vital Signs:  BP 111/88   Pulse 87   Temp (!) 97 F (36.1 C)   Resp 15   Wt 68.6 kg   LMP 05/19/2016   SpO2 92%   BMI 28.58 kg/m   Palliative Assessment/Data: 40%    Advanced Care Planning:   Primary Decision Maker: PATIENT  Code Status/Advance Care Planning: Full code  A discussion was had today regarding advanced directives. Concepts specific to code status, artifical feeding and  hydration, continued IV antibiotics and rehospitalization was had.  The difference between a aggressive medical intervention path and a palliative comfort care path for this patient at this time was had. The MOST form was discussed and confirmed previous choices.  Decisions/Changes to ACP: None at this time Remain Full Code Continue care guided by previously completed MOST form  Assessment & Plan:   Impression: Present on Admission:  Acute on chronic respiratory failure with hypoxia [facility])  Patient has refused SLP. PT/OT consulted and no identified acute care needs. Patient since extubated and cleared for transfer to the floor. Seizure likely due to profound hypoglycemia. Continued tenuous long term prognosis given multiple comorbidities, ongoing wound care, refusal of LE amputation.  SUMMARY OF RECOMMENDATIONS   Continue Full Code No amputation,  no feeding tube Continue to treat the treatable/Full scope other than as per above PMT will follow along the chart Please contact PMT for any additional needed  Symptom Management:  Per primary team PMT available to assist as needed  Prognosis:  Unable to determine  Discharge Planning:  To Be Determined   Discussed with: Dr. Dimas Aguas (RN), Patient   Thank you for allowing [name] to participate in the care of [name] PMT will continue to support holistically.  Time Total: 70 min  Greater than 50%  of this time was spent counseling and coordinating care related to the above assessment and plan.  Signed by: Walden Field, NP Palliative Medicine Team  Team Phone # [phone] (Nights/Weekends)  07/23/2021, 1:10 PM

## 2021-07-23 NOTE — Progress Notes (Addendum)
[facility]  PROGRESS NOTE  [name] VVO:[id number] DOB: [birth year] DOA: 07/20/2021 PCP: Jodi Marble, MD   Brief HPI:   55 year old female with past medical history of CHF with recovered EF, CKD stage III, diabetes mellitus type 2, hypertension, atrial fibrillation, peripheral vascular disease, seizure presented to the ED on 9/25 from nursing facility where she was found unresponsive.  She was brought to the hospital from facility and in route she had a generalized tonic-clonic seizure.  She was given Versed 5 mg IV after which she required bag mask ventilation.  In the ED she was found to have inadequate respiratory effort so she was intubated.  Neurology was consulted.  CT head showed stable small left frontal meningioma and chronic small vessel disease.  She was extubated on 07/21/2021.  TRH assumed care on 07/23/2021   Subjective   Patient seen and examined, denies any complaints.  Continues to have poor p.o. intake.   Assessment/Plan:     Acute metabolic encephalopathy/seizure -Stat EEG obtained showed no epileptiform discharges -Neurology feels that this was a provoked seizure due to hypoglycemia -Currently on Keppra 500 mg p.o. twice daily -Neurology recommends to taper off Keppra as outpatient  UTI -Patient was found to have abnormal UA, was started on ceftriaxone empirically -Urine culture was not obtained -We will continue with ceftriaxone, obtain urine culture -Follow urine culture results  Diabetes mellitus type 1 -Continue sliding scale insulin with NovoLog -CBG fairly well controlled  Malnutrition/failure to thrive -Patient has poor p.o. intake, poor nutritional status with extreme muscle wasting -We will consult palliative care for further clarification of goals of care -Also for discussion of artificial nutrition   Anemia -Hemoglobin is down to 7.0 this morning -Unclear etiology, hemoglobin was 9.5 on admission on 07/20/2021 -Check FOBT,  anemia panel -Transfuse for hemoglobin less than 7.   Paroxysmal atrial fibrillation -Heart rate is controlled, continue anticoagulation with apixaban -Metoprolol is currently on hold due to soft BP  Bilateral lower extremity wounds -Present on admission -Wound care consulted   Data Reviewed:   CBG:  Recent Labs  Lab 07/22/21 2330 07/23/21 0338 07/23/21 0739 07/23/21 1153 07/23/21 1534  GLUCAP 126* 94 74 128* 171*    SpO2: 92 % O2 Flow Rate (L/min): 2 L/min FiO2 (%): 30 %    Vitals:   07/23/21 0845 07/23/21 1155 07/23/21 1210 07/23/21 1536  BP:      Pulse: 87     Resp: 15     Temp:  98.9 F (37.2 C) (!) 97 F (36.1 C) 98.8 F (37.1 C)  TempSrc:  Axillary  Oral  SpO2: 92%     Weight:         Intake/Output Summary (Last 24 hours) at 07/23/2021 1734 Last data filed at 07/23/2021 1000 Gross per 24 hour  Intake 160 ml  Output 725 ml  Net -565 ml    09/26 1901 - 09/28 0700 In: 730.1 [P.O.:330] Out: 1945 [[id number]]  Filed Weights   07/23/21 0600  Weight: 68.6 kg    Data Reviewed: Basic Metabolic Panel: Recent Labs  Lab 07/20/21 1154 07/20/21 1203 07/20/21 1204 07/20/21 1228 07/20/21 1238 07/20/21 1459 07/20/21 1919 07/21/21 0017 07/21/21 0258 07/21/21 1838 07/22/21 0330  NA 145 147*   < >  --  149* 144  --  136 137  --  139  K 3.2* 2.7*   < >  --  <2.0* 2.1* 3.9 4.5 4.2  --  4.5  CL 116* 111  --   --   --  115*  --  111 110  --  112*  CO2 21*  --   --   --   --  24  --  21* 21*  --  22  GLUCOSE 90 74  --   --   --  169*  --  281* 286*  --  125*  BUN 12 13  --   --   --  11  --  11 11  --  22*  CREATININE 1.08* 1.00  --   --   --  1.10*  --  1.01* 1.05*  --  1.12*  CALCIUM 7.8*  --   --   --   --  7.5*  --  7.2* 7.4*  --  7.5*  MG  --   --   --  1.6*  --   --   --  2.1  --  1.9 1.9  PHOS  --   --   --   --   --   --   --   --  2.0* 2.9 2.9   < > = values in this interval not displayed.   Liver Function Tests: Recent Labs  Lab  07/20/21 1154 07/20/21 1459  AST 19 14*  ALT <5 12  ALKPHOS 63 67  BILITOT 1.7* 0.5  PROT 5.0* 4.9*  ALBUMIN 1.5* 1.5*   Recent Labs  Lab 07/20/21 1446  LIPASE 21  AMYLASE 56   No results for input(s): AMMONIA in the last 168 hours. CBC: Recent Labs  Lab 07/20/21 1154 07/20/21 1203 07/20/21 1204 07/20/21 1238 07/21/21 0258 07/22/21 0330  WBC 7.0  --   --   --  10.7* 13.1*  NEUTROABS  --   --   --   --   --  9.5*  HGB 9.7* 9.5* 9.5* 9.2* 8.7* 7.0*  HCT 31.0* 28.0* 28.0* 27.0* 27.9* 23.6*  MCV 92.0  --   --   --  91.8 94.4  PLT 368  --   --   --  334 272   Cardiac Enzymes: No results for input(s): CKTOTAL, CKMB, CKMBINDEX, TROPONINI in the last 168 hours. BNP (last 3 results) Recent Labs    01/16/21 1055 03/09/21 1735  BNP 352.0* 87.0    ProBNP (last 3 results) No results for input(s): PROBNP in the last 8760 hours.  CBG: Recent Labs  Lab 07/22/21 2330 07/23/21 0338 07/23/21 0739 07/23/21 1153 07/23/21 1534  GLUCAP 126* 94 74 128* 171*    Recent Results (from the past 240 hour(s))  Resp Panel by RT-PCR (Flu A&B, Covid) Nasopharyngeal Swab     Status: Abnormal   Collection Time: 07/20/21 12:03 PM   Specimen: Nasopharyngeal Swab; Nasopharyngeal(NP) swabs in vial transport medium  Result Value Ref Range Status   SARS Coronavirus 2 by RT PCR POSITIVE (A) NEGATIVE Final    Comment: RESULT CALLED TO, READ BACK BY AND VERIFIED WITH: RN R.HARDY ON [id number] AT [id number] BY E.PARRISH (NOTE) SARS-CoV-2 target nucleic acids are DETECTED.  The SARS-CoV-2 RNA is generally detectable in upper respiratory specimens during the acute phase of infection. Positive results are indicative of the presence of the identified virus, but do not rule out bacterial infection or co-infection with other pathogens not detected by the test. Clinical correlation with patient history and other diagnostic information is necessary to determine patient infection status. The expected  result is Negative.  Fact Sheet for Patients: EntrepreneurPulse.com.au  Fact Sheet for Healthcare Providers: IncredibleEmployment.be  This test  is not yet approved or cleared by the [place] and  has been authorized for detection and/or diagnosis of SARS-CoV-2 by FDA under an Emergency Use Authorization (EUA).  This EUA will remain in effect (meaning this t est can be used) for the duration of  the COVID-19 declaration under Section 564(b)(1) of the Act, 21 U.S.C. section 360bbb-3(b)(1), unless the authorization is terminated or revoked sooner.     Influenza A by PCR NEGATIVE NEGATIVE Final   Influenza B by PCR NEGATIVE NEGATIVE Final    Comment: (NOTE) The Xpert Xpress SARS-CoV-2/FLU/RSV plus assay is intended as an aid in the diagnosis of influenza from Nasopharyngeal swab specimens and should not be used as a sole basis for treatment. Nasal washings and aspirates are unacceptable for Xpert Xpress SARS-CoV-2/FLU/RSV testing.  Fact Sheet for Patients: EntrepreneurPulse.com.au  Fact Sheet for Healthcare Providers: IncredibleEmployment.be  This test is not yet approved or cleared by the [place] FDA and has been authorized for detection and/or diagnosis of SARS-CoV-2 by FDA under an Emergency Use Authorization (EUA). This EUA will remain in effect (meaning this test can be used) for the duration of the COVID-19 declaration under Section 564(b)(1) of the Act, 21 U.S.C. section 360bbb-3(b)(1), unless the authorization is terminated or revoked.  Performed at [facility], [place] [address], [place], [place] [address]   Blood culture (routine x 2)     Status: None (Preliminary result)   Collection Time: 07/20/21 12:28 PM   Specimen: BLOOD  Result Value Ref Range Status   Specimen Description BLOOD SITE NOT SPECIFIED  Final   Special Requests   Final    BOTTLES DRAWN AEROBIC AND ANAEROBIC  Blood Culture adequate volume   Culture   Final    NO GROWTH 3 DAYS Performed at [facility], [address], [place], [place] [address]    Report Status PENDING  Incomplete  MRSA Next Gen by PCR, Nasal     Status: None   Collection Time: 07/20/21  6:49 PM   Specimen: Nasal Mucosa; Nasal Swab  Result Value Ref Range Status   MRSA by PCR Next Gen NOT DETECTED NOT DETECTED Final    Comment: (NOTE) The GeneXpert MRSA Assay (FDA approved for NASAL specimens only), is one component of a comprehensive MRSA colonization surveillance program. It is not intended to diagnose MRSA infection nor to guide or monitor treatment for MRSA infections. Test performance is not FDA approved in patients less than 48 years old. Performed at [facility], [place] [address], [place], [place] [address]      Radiology Reports  No results found.   Scheduled medications:    apixaban  5 mg Oral BID   aspirin EC  81 mg Oral Daily   benztropine  0.5 mg Oral BID   chlorhexidine gluconate (MEDLINE KIT)  15 mL Mouth Rinse BID   Chlorhexidine Gluconate Cloth  6 each Topical Daily   feeding supplement  237 mL Oral TID BM   FLUoxetine  20 mg Oral Daily   insulin aspart  0-9 Units Subcutaneous Q4H   insulin glargine-yfgn  8 Units Subcutaneous Daily   multivitamin with minerals  1 tablet Oral Daily   oxybutynin  5 mg Oral Daily   pantoprazole  40 mg Oral Daily   rosuvastatin  10 mg Oral QPM   sodium chloride flush  10-40 mL Intracatheter Q12H   thiamine  100 mg Oral Daily    Antibiotics: Anti-infectives (From admission,  onward)    Start     Dose/Rate Route Frequency Ordered Stop   07/21/21 1000  cefTRIAXone (ROCEPHIN) 1 g in sodium chloride 0.9 % 100 mL IVPB        1 g 200 mL/hr over 30 Minutes Intravenous Every 24 hours 07/21/21 0903 07/23/21 1108         DVT prophylaxis: Apixaban  Code Status: Full code  Family Communication: No family at  bedside   Consultants: Neurology  Procedures: Intubation from 07/20/2021 to 07/21/2021    Objective    Physical Examination:  General-appears in no acute distress Heart-S1-S2, regular, no murmur auscultated Lungs-clear to auscultation bilaterally, no wheezing or crackles auscultated Abdomen-soft, nontender, no organomegaly Extremities-bilateral lower extremity edema, venous stasis ulcers Neuro-alert, oriented x3, no focal deficit noted   Status is: Inpatient  Dispo: The patient is from: Skilled nursing facility              Anticipated d/c is to: Skilled nursing facility              Anticipated d/c date is: 07-28-21              Patient currently not stable for discharge  Barrier to discharge-poor p.o. intake  COVID-19 Labs  No results for input(s): DDIMER, FERRITIN, LDH, CRP in the last 72 hours.  Lab Results  Component Value Date   SARSCOV2NAA POSITIVE (A) 07/20/2021   SARSCOV2NAA POSITIVE (A) 07/01/2021   [place] NEGATIVE 05/29/2021   [place] NEGATIVE 04/21/2021     Pressure Injury 12/03/20 Perineum Posterior Stage 1 -  Intact skin with non-blanchable redness of a localized area usually over a bony prominence. (Active)  12/03/20 2210  Location: Perineum  Location Orientation: Posterior  Staging: Stage 1 -  Intact skin with non-blanchable redness of a localized area usually over a bony prominence.  Wound Description (Comments):   Present on Admission: Yes     Pressure Injury 12/04/20 Coccyx Right;Medial Stage 2 -  Partial thickness loss of dermis presenting as a shallow open injury with a red, pink wound bed without slough. 1cm x 0.5cm area to the right of previously healed pressure ulcer (Active)  12/04/20 0800  Location: Coccyx  Location Orientation: Right;Medial  Staging: Stage 2 -  Partial thickness loss of dermis presenting as a shallow open injury with a red, pink wound bed without slough.  Wound Description (Comments): 1cm x 0.5cm area to the  right of previously healed pressure ulcer  Present on Admission: Yes     Pressure Injury 07/20/21 Heel Distal;Posterior;Right Unstageable - Full thickness tissue loss in which the base of the injury is covered by slough (yellow, tan, gray, green or brown) and/or eschar (tan, brown or black) in the wound bed. arterial dx, uncl (Active)  07/20/21 1640  Location: Heel  Location Orientation: Distal;Posterior;Right  Staging: Unstageable - Full thickness tissue loss in which the base of the injury is covered by slough (yellow, tan, gray, green or brown) and/or eschar (tan, brown or black) in the wound bed.  Wound Description (Comments): arterial dx, unclear etilogy  Present on Admission: Yes          [place]   [facility] If 7PM-7AM, please contact night-coverage at [email or web address], Office  [phone]   07/23/2021, 5:34 PM  LOS: 3 days

## 2021-07-23 NOTE — Progress Notes (Signed)
SLP Cancellation Note  Patient Details Name: [name] MRN: [id number] DOB: Oct 06, 1966   Cancelled treatment:       Reason Eval/Treat Not Completed: Patient declined, no reason specified (Pt was approached for treatment. She refused all p.o. intake stating, "I don't want any.Marland KitchenMarland KitchenI don't feel like doing anything for you." Pt became more adamant with encouragement and stated "I won't feel like doing anything later either". SLP will follow up.)  Acel Natzke I. Hardin Negus, [place], [place] Office number [phone] Pager [place] 07/23/2021, 9:03 AM

## 2021-07-23 NOTE — Progress Notes (Signed)
Nutrition Follow-up  DOCUMENTATION CODES:   Severe malnutrition in context of chronic illness  INTERVENTION:   Pt with severe malnutrition and multiple wounds. If poor PO intake persists, recommend considering Cortrak placement and initiation of enteral nutrition if within pt's [place]. Recommend: - Osmolite 1.2 @ 65 ml/hr (1560 ml/day)  Recommended tube feeding regimen would provide 1872 kcal, 87 grams of protein, and 1279 ml of H2O.   - Ensure Enlive po TID, each supplement provides 350 kcal and 20 grams of protein  - MVI with minerals daily  - Encourage PO intake and provide feeding assistance as needed  NUTRITION DIAGNOSIS:   Severe Malnutrition related to chronic illness (CHF, COPD) as evidenced by severe muscle depletion, percent weight loss (30% weight loss in 5 months).  Ongoing, being addressed via oral nutrition supplements  GOAL:   Patient will meet greater than or equal to 90% of their needs  Progressing  MONITOR:   PO intake, Supplement acceptance, Labs, Weight trends, Skin, I & O's  REASON FOR ASSESSMENT:   Ventilator, Consult Enteral/tube feeding initiation and management  ASSESSMENT:   55 year old female who presented to the ED from [place] on 9/25 with AMS, witnessed seizures. PMH of seizures, CHF, CKD stage III, COPD, T2DM, HTN, HLD, polysubstance abuse (tobacco, EtOH marijuana, cocaine) atrial fibrillation, PVD. Pt required intubation in the ED.  9/26 - extubated 9/27 - diet advanced to regular with thin liquids  Discussed pt with RN and during ICU rounds. The only thing that pt consumed for RN prior to rounds at 1130 was some grape juice. Pt refused everything else from her breakfast meal tray.  RD met with pt and attempted to encourage pt to eat something else from her meal tray but pt adamant that she will not because she doesn't like it. RD asked which foods pt does like but pt only able to name a few things (sandwich, vegetable soup). RD  will ensure that pt is "with assist" so that someone will help take her meal orders. Pt reports that she does like strawberry Ensure and consumes this at the SNF where she resides. After rounds, RD was able to assist pt in consuming 100% of an Ensure Plus High Protein (350 kcal, 20 grams of protein). Will order these TID between meals.  Discussed poor PO intake with MD. Recommended considering Cortrak placement and initiation of enteral nutrition given pt is severely malnourished at baseline and has multiple wounds. MD to consult Palliative Care regarding [place].  Current weight of 68.6 kg is comparable to weight on 9/12 of 67.6 kg. However, pt with mild pitting edema to BUE and BLE which may be masking true dry weight.  Meal Completion: 25-30%  Medications reviewed and include: SSI q 4 hours, semglee 8 units daily, protonix, thiamine, IV abx, keppra  Labs reviewed: BUN 22, creatinine 1.12, hemoglobin 7.0 CBG's: 74-141 x 24 hours  UOP: 1700 ml x 24 hours I/O's: +1.9 L since admit  Diet Order:   Diet Order             Diet regular Room service appropriate? Yes with Assist; Fluid consistency: Thin  Diet effective now                   EDUCATION NEEDS:   Education needs have been addressed  Skin:  Skin Assessment: Skin Integrity Issues: Stage III: R heel, sacrum Other: non-pressure wound to RLE x 3, non-pressure wound to LLE x 3  Last BM:  no documented  BM  Height:   Ht Readings from Last 1 Encounters:  07/07/21 _0  (1.549 m)    Weight:   Wt Readings from Last 1 Encounters:  07/23/21 68.6 kg    Ideal Body Weight:  47.7 kg  BMI:  Body mass index is 28.58 kg/m.  Estimated Nutritional Needs:   Kcal:  1800-2000  Protein:  90-110 grams  Fluid:  1.8 L/day    Gustavus Bryant, MS, RD, LDN Inpatient Clinical Dietitian Please see AMiON for contact information.

## 2021-07-24 DIAGNOSIS — D649 Anemia, unspecified: Secondary | ICD-10-CM | POA: Diagnosis not present

## 2021-07-24 DIAGNOSIS — E162 Hypoglycemia, unspecified: Secondary | ICD-10-CM | POA: Diagnosis not present

## 2021-07-24 DIAGNOSIS — J9621 Acute and chronic respiratory failure with hypoxia: Secondary | ICD-10-CM | POA: Diagnosis not present

## 2021-07-24 DIAGNOSIS — R569 Unspecified convulsions: Secondary | ICD-10-CM | POA: Diagnosis not present

## 2021-07-24 LAB — GLUCOSE, CAPILLARY
Glucose-Capillary: 76 mg/dL (ref 70–99)
Glucose-Capillary: 84 mg/dL (ref 70–99)
Glucose-Capillary: 82 mg/dL (ref 70–99)
Glucose-Capillary: 150 mg/dL — ABNORMAL HIGH (ref 70–99)
Glucose-Capillary: 113 mg/dL — ABNORMAL HIGH (ref 70–99)

## 2021-07-24 MED ORDER — LEVETIRACETAM 500 MG PO TABS
500.0000 mg | ORAL_TABLET | Freq: Two times a day (BID) | ORAL | Status: DC
  Administered 2021-07-24 – 2021-08-02 (×17): 500 mg via ORAL
  Filled 2021-07-24 (×18): qty 1

## 2021-07-24 MED ORDER — METOPROLOL SUCCINATE ER 50 MG PO TB24
50.0000 mg | ORAL_TABLET | Freq: Every day | ORAL | Status: DC
  Administered 2021-07-24 – 2021-08-02 (×10): 50 mg via ORAL
  Filled 2021-07-24 (×10): qty 1

## 2021-07-24 MED ORDER — ACETAMINOPHEN 325 MG PO TABS
650.0000 mg | ORAL_TABLET | Freq: Four times a day (QID) | ORAL | Status: DC | PRN
  Administered 2021-07-25 – 2021-08-02 (×11): 650 mg via ORAL
  Filled 2021-07-24 (×11): qty 2

## 2021-07-24 NOTE — Progress Notes (Signed)
SLP Cancellation Note  Patient Details Name: SHATONA ANDUJAR MRN: [id number] DOB: [birth year]   Cancelled treatment:       Reason Eval/Treat Not Completed: Patient declined, no reason specified (Pt refused all p.o. intake despite encouragement. SLP will follow up.)  Talon Witting I. Hardin Negus, [place], [place] Office number [phone] Pager [place] 07/24/2021, 8:51 AM

## 2021-07-24 NOTE — Progress Notes (Signed)
[facility]  PROGRESS NOTE  [name] QQI:[id number] DOB: [birth year] DOA: 07/20/2021 PCP: Jodi Marble, MD   Brief HPI:   55 year old female with past medical history of CHF with recovered EF, CKD stage III, diabetes mellitus type 2, hypertension, atrial fibrillation, peripheral vascular disease, seizure presented to the ED on 9/25 from nursing facility where she was found unresponsive.  She was brought to the hospital from facility and in route she had a generalized tonic-clonic seizure.  She was given Versed 5 mg IV after which she required bag mask ventilation.  In the ED she was found to have inadequate respiratory effort so she was intubated.  Neurology was consulted.  CT head showed stable small left frontal meningioma and chronic small vessel disease.  She was extubated on 07/21/2021.  [facility] assumed care on 07/23/2021   Subjective   Patient seen and examined, continues to have poor p.o. intake.  Refuses to get feeding tube placed.   Assessment/Plan:     Acute metabolic encephalopathy/seizure -Stat EEG obtained showed no epileptiform discharges -Neurology feels that this was a provoked seizure due to hypoglycemia -Currently on Keppra 500 mg p.o. twice daily -Neurology recommends to taper off Keppra as outpatient  UTI -Patient was found to have abnormal UA, was started on ceftriaxone empirically -Urine culture growing Enterococcus faecium -Follow urine culture results -Continue Rocephin  Diabetes mellitus type 1 -Continue sliding scale insulin with NovoLog -CBG fairly well controlled  Malnutrition/failure to thrive -Patient has poor p.o. intake -Palliative care was consulted for goals of care and patient has decided not to pursue with feeding tube -She wants to be full code  Anemia -Hemoglobin is down to 7.0 this morning -Unclear etiology, hemoglobin was 9.5 on admission on 07/20/2021 -Check -FOBT, anemia panel is currently pending -Transfuse for  hemoglobin less than 7. -Follow CBC in a.m.   Paroxysmal atrial fibrillation -Heart rate is controlled, continue anticoagulation with apixaban -We will restart metoprolol as blood pressure is rising  Bilateral lower extremity wounds -Present on admission -Wound care consulted   Data Reviewed:   CBG:  Recent Labs  Lab 07/23/21 1927 07/23/21 2347 07/24/21 0423 07/24/21 0726 07/24/21 1130  GLUCAP 199* 96 76 84 82    SpO2: 97 % O2 Flow Rate (L/min): 2 L/min FiO2 (%): 30 %    Vitals:   07/24/21 0721 07/24/21 0816 07/24/21 1131 07/24/21 1200  BP: (!) 144/103 (!) 140/92 (!) 171/108 (!) 156/112  Pulse: 98  100   Resp: 14 19 15 16   Temp: 98.4 F (36.9 C)  98.2 F (36.8 C)   TempSrc: Oral  Axillary   SpO2: 94%  97%   Weight:         Intake/Output Summary (Last 24 hours) at 07/24/2021 1553 Last data filed at 07/24/2021 1200 Gross per 24 hour  Intake 320 ml  Output 701 ml  Net -381 ml    09/27 1901 - 09/29 0700 In: 300  Out: 1300 [Urine:1300]  Filed Weights   07/23/21 0600 07/24/21 0500  Weight: 68.6 kg 65.4 kg    Data Reviewed: Basic Metabolic Panel: Recent Labs  Lab 07/20/21 1154 07/20/21 1203 07/20/21 1204 07/20/21 1228 07/20/21 1238 07/20/21 1459 07/20/21 1919 07/21/21 0017 07/21/21 0258 07/21/21 1838 07/22/21 0330  NA 145 147*   < >  --  149* 144  --  136 137  --  139  K 3.2* 2.7*   < >  --  <2.0* 2.1* 3.9 4.5 4.2  --  4.5  CL 116* 111  --   --   --  115*  --  111 110  --  112*  CO2 21*  --   --   --   --  24  --  21* 21*  --  22  GLUCOSE 90 74  --   --   --  169*  --  281* 286*  --  125*  BUN 12 13  --   --   --  11  --  11 11  --  22*  CREATININE 1.08* 1.00  --   --   --  1.10*  --  1.01* 1.05*  --  1.12*  CALCIUM 7.8*  --   --   --   --  7.5*  --  7.2* 7.4*  --  7.5*  MG  --   --   --  1.6*  --   --   --  2.1  --  1.9 1.9  PHOS  --   --   --   --   --   --   --   --  2.0* 2.9 2.9   < > = values in this interval not displayed.   Liver  Function Tests: Recent Labs  Lab 07/20/21 1154 07/20/21 1459  AST 19 14*  ALT <5 12  ALKPHOS 63 67  BILITOT 1.7* 0.5  PROT 5.0* 4.9*  ALBUMIN 1.5* 1.5*   Recent Labs  Lab 07/20/21 1446  LIPASE 21  AMYLASE 56   No results for input(s): AMMONIA in the last 168 hours. CBC: Recent Labs  Lab 07/20/21 1154 07/20/21 1203 07/20/21 1204 07/20/21 1238 07/21/21 0258 07/22/21 0330  WBC 7.0  --   --   --  10.7* 13.1*  NEUTROABS  --   --   --   --   --  9.5*  HGB 9.7* 9.5* 9.5* 9.2* 8.7* 7.0*  HCT 31.0* 28.0* 28.0* 27.0* 27.9* 23.6*  MCV 92.0  --   --   --  91.8 94.4  PLT 368  --   --   --  334 272   Cardiac Enzymes: No results for input(s): CKTOTAL, CKMB, CKMBINDEX, TROPONINI in the last 168 hours. BNP (last 3 results) Recent Labs    01/16/21 1055 03/09/21 1735  BNP 352.0* 87.0    ProBNP (last 3 results) No results for input(s): PROBNP in the last 8760 hours.  CBG: Recent Labs  Lab 07/23/21 1927 07/23/21 2347 07/24/21 0423 07/24/21 0726 07/24/21 1130  GLUCAP 199* 96 76 84 82    Recent Results (from the past 240 hour(s))  Resp Panel by RT-PCR (Flu A&B, Covid) Nasopharyngeal Swab     Status: Abnormal   Collection Time: 07/20/21 12:03 PM   Specimen: Nasopharyngeal Swab; Nasopharyngeal(NP) swabs in vial transport medium  Result Value Ref Range Status   SARS Coronavirus 2 by RT PCR POSITIVE (A) NEGATIVE Final    Comment: RESULT CALLED TO, READ BACK BY AND VERIFIED WITH: RN R.HARDY ON [id number] AT [id number] BY E.PARRISH (NOTE) SARS-CoV-2 target nucleic acids are DETECTED.  The SARS-CoV-2 RNA is generally detectable in upper respiratory specimens during the acute phase of infection. Positive results are indicative of the presence of the identified virus, but do not rule out bacterial infection or co-infection with other pathogens not detected by the test. Clinical correlation with patient history and other diagnostic information is necessary to determine  patient infection status. The expected result is Negative.  Fact  Sheet for Patients: EntrepreneurPulse.com.au  Fact Sheet for Healthcare Providers: IncredibleEmployment.be  This test is not yet approved or cleared by the [place] FDA and  has been authorized for detection and/or diagnosis of SARS-CoV-2 by FDA under an Emergency Use Authorization (EUA).  This EUA will remain in effect (meaning this t est can be used) for the duration of  the COVID-19 declaration under Section 564(b)(1) of the Act, 21 U.S.C. section 360bbb-3(b)(1), unless the authorization is terminated or revoked sooner.     Influenza A by PCR NEGATIVE NEGATIVE Final   Influenza B by PCR NEGATIVE NEGATIVE Final    Comment: (NOTE) The Xpert Xpress SARS-CoV-2/FLU/RSV plus assay is intended as an aid in the diagnosis of influenza from Nasopharyngeal swab specimens and should not be used as a sole basis for treatment. Nasal washings and aspirates are unacceptable for Xpert Xpress SARS-CoV-2/FLU/RSV testing.  Fact Sheet for Patients: EntrepreneurPulse.com.au  Fact Sheet for Healthcare Providers: IncredibleEmployment.be  This test is not yet approved or cleared by the [place] FDA and has been authorized for detection and/or diagnosis of SARS-CoV-2 by FDA under an Emergency Use Authorization (EUA). This EUA will remain in effect (meaning this test can be used) for the duration of the COVID-19 declaration under Section 564(b)(1) of the Act, 21 U.S.C. section 360bbb-3(b)(1), unless the authorization is terminated or revoked.  Performed at [facility], [place] [address] [place], [place] [address]   Blood culture (routine x 2)     Status: None (Preliminary result)   Collection Time: 07/20/21 12:28 PM   Specimen: BLOOD  Result Value Ref Range Status   Specimen Description BLOOD SITE NOT SPECIFIED  Final   Special Requests   Final     BOTTLES DRAWN AEROBIC AND ANAEROBIC Blood Culture adequate volume   Culture   Final    NO GROWTH 4 DAYS Performed at [facility], [address], [place], [place] [address]    Report Status PENDING  Incomplete  MRSA Next Gen by PCR, Nasal     Status: None   Collection Time: 07/20/21  6:49 PM   Specimen: Nasal Mucosa; Nasal Swab  Result Value Ref Range Status   MRSA by PCR Next Gen NOT DETECTED NOT DETECTED Final    Comment: (NOTE) The GeneXpert MRSA Assay (FDA approved for NASAL specimens only), is one component of a comprehensive MRSA colonization surveillance program. It is not intended to diagnose MRSA infection nor to guide or monitor treatment for MRSA infections. Test performance is not FDA approved in patients less than 72 years old. Performed at [facility], [place] [address], [place], [place] [address]   Urine Culture     Status: Abnormal (Preliminary result)   Collection Time: 07/23/21 10:17 AM   Specimen: Urine, Clean Catch  Result Value Ref Range Status   Specimen Description URINE, CLEAN CATCH  Final   Special Requests NONE  Final   Culture (A)  Final    20,000 COLONIES/mL ENTEROCOCCUS FAECIUM SUSCEPTIBILITIES TO FOLLOW Performed at [name], [place] [address] [place],  [address]    Report Status PENDING  Incomplete     Radiology Reports  No results found.   Scheduled medications:    apixaban  5 mg Oral BID   aspirin EC  81 mg Oral Daily   benztropine  0.5 mg Oral BID   feeding supplement  237 mL Oral TID BM   FLUoxetine  20 mg Oral Daily  insulin aspart  0-9 Units Subcutaneous Q4H   levETIRAcetam  500 mg Oral BID   metoprolol succinate  50 mg Oral Daily   multivitamin with minerals  1 tablet Oral Daily   oxybutynin  5 mg Oral Daily   pantoprazole  40 mg Oral Daily   rosuvastatin  10 mg Oral QPM   sodium chloride flush  10-40 mL Intracatheter Q12H   thiamine  100 mg Oral Daily    Antibiotics: Anti-infectives (From  admission, onward)    Start     Dose/Rate Route Frequency Ordered Stop   07/21/21 1000  cefTRIAXone (ROCEPHIN) 1 g in sodium chloride 0.9 % 100 mL IVPB        1 g 200 mL/hr over 30 Minutes Intravenous Every 24 hours 07/21/21 0903 07/24/21 0705         DVT prophylaxis: Apixaban  Code Status: Full code  Family Communication: No family at bedside   Consultants: Neurology  Procedures: Intubation from 07/20/2021 to 07/21/2021    Objective    Physical Examination:  General-appears in no acute distress Heart-S1-S2, regular, no murmur auscultated Lungs-clear to auscultation bilaterally, no wheezing or crackles auscultated Abdomen-soft, nontender, no organomegaly Extremities-no edema in the lower extremities Neuro-alert, oriented x3, no focal deficit noted   Status is: Inpatient  Dispo: The patient is from: Skilled nursing facility              Anticipated d/c is to: Skilled nursing facility              Anticipated d/c date is: 07-28-21              Patient currently not stable for discharge  Barrier to discharge-poor p.o. intake  COVID-19 Labs  No results for input(s): DDIMER, FERRITIN, LDH, CRP in the last 72 hours.  Lab Results  Component Value Date   SARSCOV2NAA POSITIVE (A) 07/20/2021   SARSCOV2NAA POSITIVE (A) 07/01/2021   [place] NEGATIVE 05/29/2021   [place] NEGATIVE 04/21/2021     Pressure Injury 12/03/20 Perineum Posterior Stage 1 -  Intact skin with non-blanchable redness of a localized area usually over a bony prominence. (Active)  12/03/20 2210  Location: Perineum  Location Orientation: Posterior  Staging: Stage 1 -  Intact skin with non-blanchable redness of a localized area usually over a bony prominence.  Wound Description (Comments):   Present on Admission: Yes     Pressure Injury 12/04/20 Coccyx Right;Medial Stage 2 -  Partial thickness loss of dermis presenting as a shallow open injury with a red, pink wound bed without slough. 1cm  x 0.5cm area to the right of previously healed pressure ulcer (Active)  12/04/20 0800  Location: Coccyx  Location Orientation: Right;Medial  Staging: Stage 2 -  Partial thickness loss of dermis presenting as a shallow open injury with a red, pink wound bed without slough.  Wound Description (Comments): 1cm x 0.5cm area to the right of previously healed pressure ulcer  Present on Admission: Yes     Pressure Injury 07/20/21 Heel Distal;Posterior;Right Unstageable - Full thickness tissue loss in which the base of the injury is covered by slough (yellow, tan, gray, green or brown) and/or eschar (tan, brown or black) in the wound bed. arterial dx, uncl (Active)  07/20/21 1640  Location: Heel  Location Orientation: Distal;Posterior;Right  Staging: Unstageable - Full thickness tissue loss in which the base of the injury is covered by slough (yellow, tan, gray, green or brown) and/or eschar (tan, brown or black) in the wound  bed.  Wound Description (Comments): arterial dx, unclear etilogy  Present on Admission: Yes     Pressure Injury 07/23/21 Pretibial Distal;Left Unstageable - Full thickness tissue loss in which the base of the injury is covered by slough (yellow, tan, gray, green or brown) and/or eschar (tan, brown or black) in the wound bed. (Active)  07/23/21 1900  Location: Pretibial  Location Orientation: Distal;Left  Staging: Unstageable - Full thickness tissue loss in which the base of the injury is covered by slough (yellow, tan, gray, green or brown) and/or eschar (tan, brown or black) in the wound bed.  Wound Description (Comments):   Present on Admission: Yes          [place]   [facility] If 7PM-7AM, please contact night-coverage at [email or web address], Office  [phone]   07/24/2021, 3:53 PM  LOS: 4 days

## 2021-07-24 NOTE — Care Management Important Message (Signed)
Important Message  Patient Details  Name: [name] MRN: [id number] Date of Birth: September 02, 1966   Medicare Important Message Given:  Yes     Janaiah Vetrano 07/24/2021, 3:20 PM

## 2021-07-24 NOTE — Progress Notes (Signed)
Patient refused to have lab work drawn at this time.  Attempted to inform patient of importance of lab work but patient stated "I said no".

## 2021-07-25 DIAGNOSIS — D649 Anemia, unspecified: Secondary | ICD-10-CM | POA: Diagnosis not present

## 2021-07-25 DIAGNOSIS — R569 Unspecified convulsions: Secondary | ICD-10-CM | POA: Diagnosis not present

## 2021-07-25 DIAGNOSIS — J9621 Acute and chronic respiratory failure with hypoxia: Secondary | ICD-10-CM | POA: Diagnosis not present

## 2021-07-25 DIAGNOSIS — E162 Hypoglycemia, unspecified: Secondary | ICD-10-CM | POA: Diagnosis not present

## 2021-07-25 LAB — GLUCOSE, CAPILLARY
Glucose-Capillary: 109 mg/dL — ABNORMAL HIGH (ref 70–99)
Glucose-Capillary: 137 mg/dL — ABNORMAL HIGH (ref 70–99)
Glucose-Capillary: 152 mg/dL — ABNORMAL HIGH (ref 70–99)
Glucose-Capillary: 186 mg/dL — ABNORMAL HIGH (ref 70–99)
Glucose-Capillary: 75 mg/dL (ref 70–99)
Glucose-Capillary: 92 mg/dL (ref 70–99)
Glucose-Capillary: 98 mg/dL (ref 70–99)

## 2021-07-25 LAB — URINE CULTURE: Culture: 20000 — AB

## 2021-07-25 LAB — CBC
HCT: 28.4 % — ABNORMAL LOW (ref 36.0–46.0)
Hemoglobin: 8.5 g/dL — ABNORMAL LOW (ref 12.0–15.0)
MCH: 27.9 pg (ref 26.0–34.0)
MCHC: 29.9 g/dL — ABNORMAL LOW (ref 30.0–36.0)
MCV: 93.1 fL (ref 80.0–100.0)
Platelets: 331 10*3/uL (ref 150–400)
RBC: 3.05 MIL/uL — ABNORMAL LOW (ref 3.87–5.11)
RDW: 16.5 % — ABNORMAL HIGH (ref 11.5–15.5)
WBC: 6.9 10*3/uL (ref 4.0–10.5)
nRBC: 0.6 % — ABNORMAL HIGH (ref 0.0–0.2)

## 2021-07-25 LAB — CULTURE, BLOOD (ROUTINE X 2)
Culture: NO GROWTH
Special Requests: ADEQUATE

## 2021-07-25 NOTE — Progress Notes (Signed)
Son has arrived, called phlebotomist but no answer. Will try again shortly.

## 2021-07-25 NOTE — Progress Notes (Signed)
[facility]  PROGRESS NOTE  [name] QPY:[id number] DOB: 08-18-1966 DOA: 07/20/2021 PCP: Jodi Marble, MD   Brief HPI:   55 year old female with past medical history of CHF with recovered EF, CKD stage III, diabetes mellitus type 2, hypertension, atrial fibrillation, peripheral vascular disease, seizure presented to the ED on 9/25 from nursing facility where she was found unresponsive.  She was brought to the hospital from facility and in route she had a generalized tonic-clonic seizure.  She was given Versed 5 mg IV after which she required bag mask ventilation.  In the ED she was found to have inadequate respiratory effort so she was intubated.  Neurology was consulted.  CT head showed stable small left frontal meningioma and chronic small vessel disease.  She was extubated on 07/21/2021.  TR[facility] assumed care on 07/23/2021   Subjective   Patient seen and examined, urine culture grew 20,000 colonies per mL of VRE.  Urine sample was obtained while patient had Foley catheter.   Assessment/Plan:     Acute metabolic encephalopathy/seizure -Stat EEG obtained showed no epileptiform discharges -Neurology feels that this was a provoked seizure due to hypoglycemia -Currently on Keppra 500 mg p.o. twice daily -Neurology recommends to taper off Keppra as outpatient  UTI -Patient was found to have abnormal UA, was started on ceftriaxone empirically -Urine culture growing Enterococcus faecium -Urine culture is growing VRE 20,000 colonies per mL -Called and discussed with ID Dr. Linus Salmons, he says no need to treat as it is likely colonization -Patient is afebrile and denies dysuria.  Will not treat at this time.   We will discontinue antibiotics  Diabetes mellitus type 1 -Continue sliding scale insulin with NovoLog -CBG fairly well controlled  Malnutrition/failure to thrive -Patient has poor p.o. intake -Palliative care was consulted for goals of care and patient has decided not  to pursue with feeding tube -She wants to be full code  Anemia -Hemoglobin is up to 8.5 today - hemoglobin was 9.5 on admission on 07/20/2021 -Check -FOBT, anemia panel is currently pending -Transfuse for hemoglobin less than 7. -She had EGD in January of this year which showed esophagitis -Continue Protonix 40 mg p.o. daily -Follow CBC in a.m.   Paroxysmal atrial fibrillation -Heart rate is controlled, continue anticoagulation with apixaban -Continue metoprolol  Bilateral lower extremity wounds -Present on admission -Wound care consulted   Data Reviewed:   CBG:  Recent Labs  Lab 07/24/21 2005 07/25/21 0023 07/25/21 0430 07/25/21 0734 07/25/21 1201  GLUCAP 113* 75 152* 186* 109*    SpO2: 100 % O2 Flow Rate (L/min): 2 L/min FiO2 (%): 30 %    Vitals:   07/24/21 2003 07/25/21 0431 07/25/21 0729 07/25/21 1158  BP: (!) 139/104 (!) 140/101 (!) 128/97 (!) 145/99  Pulse: 90 92 85 77  Resp: 14 (!) 22 17 17   Temp: 98.2 F (36.8 C) 98.7 F (37.1 C) 98.3 F (36.8 C) 98.4 F (36.9 C)  TempSrc: Oral Oral Oral Oral  SpO2: 95% 93% 100% 100%  Weight:         Intake/Output Summary (Last 24 hours) at 07/25/2021 1542 Last data filed at 07/25/2021 1429 Gross per 24 hour  Intake 120 ml  Output 1300 ml  Net -1180 ml    09/28 1901 - 09/30 0700 In: 220 [P.O.:120] Out: 1251 [Urine:1250]  Filed Weights   07/23/21 0600 07/24/21 0500  Weight: 68.6 kg 65.4 kg    Data Reviewed: Basic Metabolic Panel: Recent Labs  Lab 07/20/21 1154  07/20/21 1203 07/20/21 1204 07/20/21 1228 07/20/21 1238 07/20/21 1459 07/20/21 1919 07/21/21 0017 07/21/21 0258 07/21/21 1838 07/22/21 0330  NA 145 147*   < >  --  149* 144  --  136 137  --  139  K 3.2* 2.7*   < >  --  <2.0* 2.1* 3.9 4.5 4.2  --  4.5  CL 116* 111  --   --   --  115*  --  111 110  --  112*  CO2 21*  --   --   --   --  24  --  21* 21*  --  22  GLUCOSE 90 74  --   --   --  169*  --  281* 286*  --  125*  BUN 12 13  --    --   --  11  --  11 11  --  22*  CREATININE 1.08* 1.00  --   --   --  1.10*  --  1.01* 1.05*  --  1.12*  CALCIUM 7.8*  --   --   --   --  7.5*  --  7.2* 7.4*  --  7.5*  MG  --   --   --  1.6*  --   --   --  2.1  --  1.9 1.9  PHOS  --   --   --   --   --   --   --   --  2.0* 2.9 2.9   < > = values in this interval not displayed.   Liver Function Tests: Recent Labs  Lab 07/20/21 1154 07/20/21 1459  AST 19 14*  ALT <5 12  ALKPHOS 63 67  BILITOT 1.7* 0.5  PROT 5.0* 4.9*  ALBUMIN 1.5* 1.5*   Recent Labs  Lab 07/20/21 1446  LIPASE 21  AMYLASE 56   No results for input(s): AMMONIA in the last 168 hours. CBC: Recent Labs  Lab 07/20/21 1154 07/20/21 1203 07/20/21 1204 07/20/21 1238 07/21/21 0258 07/22/21 0330 07/25/21 1411  WBC 7.0  --   --   --  10.7* 13.1* 6.9  NEUTROABS  --   --   --   --   --  9.5*  --   HGB 9.7*   < > 9.5* 9.2* 8.7* 7.0* 8.5*  HCT 31.0*   < > 28.0* 27.0* 27.9* 23.6* 28.4*  MCV 92.0  --   --   --  91.8 94.4 93.1  PLT 368  --   --   --  334 272 331   < > = values in this interval not displayed.   Cardiac Enzymes: No results for input(s): CKTOTAL, CKMB, CKMBINDEX, TROPONINI in the last 168 hours. BNP (last 3 results) Recent Labs    01/16/21 1055 03/09/21 1735  BNP 352.0* 87.0    ProBNP (last 3 results) No results for input(s): PROBNP in the last 8760 hours.  CBG: Recent Labs  Lab 07/24/21 2005 07/25/21 0023 07/25/21 0430 07/25/21 0734 07/25/21 1201  GLUCAP 113* 75 152* 186* 109*    Recent Results (from the past 240 hour(s))  Resp Panel by RT-PCR (Flu A&B, Covid) Nasopharyngeal Swab     Status: Abnormal   Collection Time: 07/20/21 12:03 PM   Specimen: Nasopharyngeal Swab; Nasopharyngeal(NP) swabs in vial transport medium  Result Value Ref Range Status   SARS Coronavirus 2 by RT PCR POSITIVE (A) NEGATIVE Final    Comment: RESULT CALLED TO, READ BACK  BY AND VERIFIED WITH: RN R.HARDY ON [id number] AT 0626 BY E.PARRISH (NOTE) SARS-CoV-2  target nucleic acids are DETECTED.  The SARS-CoV-2 RNA is generally detectable in upper respiratory specimens during the acute phase of infection. Positive results are indicative of the presence of the identified virus, but do not rule out bacterial infection or co-infection with other pathogens not detected by the test. Clinical correlation with patient history and other diagnostic information is necessary to determine patient infection status. The expected result is Negative.  Fact Sheet for Patients: EntrepreneurPulse.com.au  Fact Sheet for Healthcare Providers: IncredibleEmployment.be  This test is not yet approved or cleared by the [place] FDA and  has been authorized for detection and/or diagnosis of SARS-CoV-2 by FDA under an Emergency Use Authorization (EUA).  This EUA will remain in effect (meaning this t est can be used) for the duration of  the COVID-19 declaration under Section 564(b)(1) of the Act, 21 U.S.C. section 360bbb-3(b)(1), unless the authorization is terminated or revoked sooner.     Influenza A by PCR NEGATIVE NEGATIVE Final   Influenza B by PCR NEGATIVE NEGATIVE Final    Comment: (NOTE) The Xpert Xpress SARS-CoV-2/FLU/RSV plus assay is intended as an aid in the diagnosis of influenza from Nasopharyngeal swab specimens and should not be used as a sole basis for treatment. Nasal washings and aspirates are unacceptable for Xpert Xpress SARS-CoV-2/FLU/RSV testing.  Fact Sheet for Patients: EntrepreneurPulse.com.au  Fact Sheet for Healthcare Providers: IncredibleEmployment.be  This test is not yet approved or cleared by the [place] FDA and has been authorized for detection and/or diagnosis of SARS-CoV-2 by FDA under an Emergency Use Authorization (EUA). This EUA will remain in effect (meaning this test can be used) for the duration of the COVID-19 declaration under Section  564(b)(1) of the Act, 21 U.S.C. section 360bbb-3(b)(1), unless the authorization is terminated or revoked.  Performed at [facility], [place] [address], [place], [place] [address]   Blood culture (routine x 2)     Status: None   Collection Time: 07/20/21 12:28 PM   Specimen: BLOOD  Result Value Ref Range Status   Specimen Description BLOOD SITE NOT SPECIFIED  Final   Special Requests   Final    BOTTLES DRAWN AEROBIC AND ANAEROBIC Blood Culture adequate volume   Culture   Final    NO GROWTH 5 DAYS Performed at [facility], [address] [place], [place] [address]    Report Status 07/25/2021 FINAL  Final  MRSA Next Gen by PCR, Nasal     Status: None   Collection Time: 07/20/21  6:49 PM   Specimen: Nasal Mucosa; Nasal Swab  Result Value Ref Range Status   MRSA by PCR Next Gen NOT DETECTED NOT DETECTED Final    Comment: (NOTE) The GeneXpert MRSA Assay (FDA approved for NASAL specimens only), is one component of a comprehensive MRSA colonization surveillance program. It is not intended to diagnose MRSA infection nor to guide or monitor treatment for MRSA infections. Test performance is not FDA approved in patients less than 55 years old. Performed at [facility], [place] [address] [place],  [address]   Urine Culture     Status: Abnormal   Collection Time: 07/23/21 10:17 AM   Specimen: Urine, Clean Catch  Result Value Ref Range Status   Specimen Description URINE, CLEAN CATCH  Final   Special Requests   Final    NONE Performed at [facility], [place]  [address] [place], [place] [address]    Culture (A)  Final    20,000 COLONIES/mL VANCOMYCIN RESISTANT ENTEROCOCCUS ISOLATED   Report Status 07/25/2021 FINAL  Final   Organism ID, Bacteria VANCOMYCIN RESISTANT ENTEROCOCCUS ISOLATED (A)  Final      Susceptibility   Vancomycin resistant enterococcus isolated - MIC*    AMPICILLIN >=32 RESISTANT Resistant     NITROFURANTOIN 128 RESISTANT Resistant      VANCOMYCIN >=32 RESISTANT Resistant     LINEZOLID 2 SENSITIVE Sensitive     * 20,000 COLONIES/mL VANCOMYCIN RESISTANT ENTEROCOCCUS ISOLATED     Radiology Reports  No results found.   Scheduled medications:    apixaban  5 mg Oral BID   aspirin EC  81 mg Oral Daily   benztropine  0.5 mg Oral BID   feeding supplement  237 mL Oral TID BM   FLUoxetine  20 mg Oral Daily   insulin aspart  0-9 Units Subcutaneous Q4H   levETIRAcetam  500 mg Oral BID   metoprolol succinate  50 mg Oral Daily   multivitamin with minerals  1 tablet Oral Daily   oxybutynin  5 mg Oral Daily   pantoprazole  40 mg Oral Daily   rosuvastatin  10 mg Oral QPM   sodium chloride flush  10-40 mL Intracatheter Q12H   thiamine  100 mg Oral Daily    Antibiotics: Anti-infectives (From admission, onward)    Start     Dose/Rate Route Frequency Ordered Stop   07/21/21 1000  cefTRIAXone (ROCEPHIN) 1 g in sodium chloride 0.9 % 100 mL IVPB        1 g 200 mL/hr over 30 Minutes Intravenous Every 24 hours 07/21/21 0903 07/24/21 0705         DVT prophylaxis: Apixaban  Code Status: Full code  Family Communication: No family at bedside   Consultants: Neurology  Procedures: Intubation from 07/20/2021 to 07/21/2021    Objective    Physical Examination:  General-appears in no acute distress Heart-S1-S2, regular, no murmur auscultated Lungs-clear to auscultation bilaterally, no wheezing or crackles auscultated Abdomen-soft, nontender, no organomegaly Extremities-no edema in the lower extremities Neuro-alert, oriented x3, no focal deficit noted   Status is: Inpatient  Dispo: The patient is from: Skilled nursing facility              Anticipated d/c is to: Skilled nursing facility              Anticipated d/c date is: 07-28-21              Patient currently not stable for discharge  Barrier to discharge-poor p.o. intake  COVID-19 Labs  No results for input(s): DDIMER, FERRITIN, LDH, CRP in the last 72  hours.  Lab Results  Component Value Date   SARSCOV2NAA POSITIVE (A) 07/20/2021   SARSCOV2NAA POSITIVE (A) 07/01/2021   [place] NEGATIVE 05/29/2021   [place] NEGATIVE 04/21/2021     Pressure Injury 12/03/20 Perineum Posterior Stage 1 -  Intact skin with non-blanchable redness of a localized area usually over a bony prominence. (Active)  12/03/20 2210  Location: Perineum  Location Orientation: Posterior  Staging: Stage 1 -  Intact skin with non-blanchable redness of a localized area usually over a bony prominence.  Wound Description (Comments):   Present on Admission: Yes     Pressure Injury 12/04/20 Coccyx Right;Medial Stage 2 -  Partial thickness loss of dermis presenting as a shallow open injury with a red, pink wound bed without slough. 1cm x  0.5cm area to the right of previously healed pressure ulcer (Active)  12/04/20 0800  Location: Coccyx  Location Orientation: Right;Medial  Staging: Stage 2 -  Partial thickness loss of dermis presenting as a shallow open injury with a red, pink wound bed without slough.  Wound Description (Comments): 1cm x 0.5cm area to the right of previously healed pressure ulcer  Present on Admission: Yes     Pressure Injury 07/20/21 Heel Distal;Posterior;Right Unstageable - Full thickness tissue loss in which the base of the injury is covered by slough (yellow, tan, gray, green or brown) and/or eschar (tan, brown or black) in the wound bed. arterial dx, uncl (Active)  07/20/21 1640  Location: Heel  Location Orientation: Distal;Posterior;Right  Staging: Unstageable - Full thickness tissue loss in which the base of the injury is covered by slough (yellow, tan, gray, green or brown) and/or eschar (tan, brown or black) in the wound bed.  Wound Description (Comments): arterial dx, unclear etilogy  Present on Admission: Yes     Pressure Injury 07/23/21 Pretibial Distal;Left Unstageable - Full thickness tissue loss in which the base of the injury is  covered by slough (yellow, tan, gray, green or brown) and/or eschar (tan, brown or black) in the wound bed. (Active)  07/23/21 1900  Location: Pretibial  Location Orientation: Distal;Left  Staging: Unstageable - Full thickness tissue loss in which the base of the injury is covered by slough (yellow, tan, gray, green or brown) and/or eschar (tan, brown or black) in the wound bed.  Wound Description (Comments):   Present on Admission: Yes     Pressure Injury 07/23/21 Sacrum Right;Left Stage 2 -  Partial thickness loss of dermis presenting as a shallow open injury with a red, pink wound bed without slough. 4 scattered small areas pm sacrum and buttocks area (Active)  07/23/21 2148  Location: Sacrum  Location Orientation: Right;Left  Staging: Stage 2 -  Partial thickness loss of dermis presenting as a shallow open injury with a red, pink wound bed without slough.  Wound Description (Comments): 4 scattered small areas pm sacrum and buttocks area  Present on Admission:           [place]   [facility] If 7PM-7AM, please contact night-coverage at [email or web address], Office  [phone]   07/25/2021, 3:42 PM  LOS: 5 days
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
## 2021-07-25 NOTE — Progress Notes (Signed)
Pt refused lab draw. Informed pt on the importance of having lab drawn, pt still refused. Called pt's son to see if he can talk to pt about getting blood work done, pt's son stated he would be on his way up here to sit with pt while she has labs drawn. Informed phlebotomist that will call when son arrives.

## 2021-07-25 NOTE — TOC Progression Note (Signed)
Transition of Care [facility]) - Progression Note    Patient Details  Name: RONESHA HEENAN MRN: [id number] Date of Birth: 1966-10-21  Transition of Care [facility]) CM/SW [place], LCSW Phone Number: 07/25/2021, 5:10 PM  Clinical Narrative:    CSW left vm for [facility] [place] that patient has been served.    Expected Discharge Plan: Long Term Nursing Home Barriers to Discharge: No Barriers Identified  Expected Discharge Plan and Services Expected Discharge Plan: Long Term Nursing Home In-house Referral: Clinical Social Work   Post Acute Care Choice: [place] Living arrangements for the past 2 months: [place]                                       Social Determinants of Health (SDOH) Interventions    Readmission Risk Interventions Readmission Risk Prevention Plan 07/25/2021 10/31/2020 08/27/2020  Transportation Screening Complete Complete Complete  Medication Review Press photographer) Complete Complete Complete  PCP or Specialist appointment within 3-5 days of discharge Complete - Complete  HRI or Home Care Consult Complete - Complete  SW Recovery Care/Counseling Consult Complete Complete Complete  Palliative Care Screening Not Applicable Not Applicable Not Applicable  Skilled Nursing Facility Complete Complete Complete  Some recent data might be hidden
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
## 2021-07-25 NOTE — Progress Notes (Signed)
SLP Cancellation Note  Patient Details Name: ANNELIE BOAK MRN: [id number] DOB: 1966-03-22   Cancelled treatment:       Reason Eval/Treat Not Completed: Patient declined, no reason specified. Pt tolerating meals well per RN, eating food brought by family. SLp will sign off.    Amal Saiki, Katherene Ponto 07/25/2021, 2:17 PM

## 2021-07-25 NOTE — Progress Notes (Signed)
Nutrition Follow-up  DOCUMENTATION CODES:   Severe malnutrition in context of chronic illness  INTERVENTION:   -Continue Ensure Enlive po TID, each supplement provides 350 kcal and 20 grams of protein  -Continue MVI with minerals daily  NUTRITION DIAGNOSIS:   Severe Malnutrition related to chronic illness (CHF, COPD) as evidenced by severe muscle depletion, percent weight loss (30% weight loss in 5 months).  Ongoing  GOAL:   Patient will meet greater than or equal to 90% of their needs  Progressing   MONITOR:   PO intake, Supplement acceptance, Labs, Weight trends, Skin, I & O's  REASON FOR ASSESSMENT:   Ventilator, Consult Enteral/tube feeding initiation and management  ASSESSMENT:   55 year old female who presented to the ED from [place] on 9/25 with AMS, witnessed seizures. PMH of seizures, CHF, CKD stage III, COPD, T2DM, HTN, HLD, polysubstance abuse (tobacco, EtOH marijuana, cocaine) atrial fibrillation, PVD. Pt required intubation in the ED.  9/26 - extubated 9/27 - diet advanced to regular with thin liquids  Reviewed I/O's: -431 ml x 24 hours and +1.1 L since admission  UOP: 500 ml x 24 hours   Spoke with pt at bedside, who reports feeling better today. She shares that she is a "picky" eater and does not like most of the hospital food. Per pt, she consumed an Ensure Enlive for breakfast and her family has been bringing in food for her to eat (shrimp). Noted meal completion 10-100%.   Per SLP notes, pt was refusing PO trials yesterday.   Obtained pt's lunch order and food preferences (chicken salad, baked potato chips, orange juice, grape juice, lemonade, and cake).   Palliative care met with pt; she does not desire a feeding tube.   Per CSW notes, plan to d/c back to SNF today.   Medications reviewed and include thiamine.   Labs reviewed: CBGS: 75-186 (inpatient orders for glycemic control are 0-9 units insulin aspart every 4 hours).    Diet  Order:   Diet Order             Diet regular Room service appropriate? Yes with Assist; Fluid consistency: Thin  Diet effective now                   EDUCATION NEEDS:   Education needs have been addressed  Skin:  Skin Assessment: Skin Integrity Issues: Skin Integrity Issues:: Stage III, Other (Comment) Stage III: R heel, sacrum Other: non-pressure wound to RLE x 3, non-pressure wound to LLE x 3  Last BM:  07/25/21  Height:   Ht Readings from Last 1 Encounters:  07/07/21 '5\' 1"'  (1.549 m)    Weight:   Wt Readings from Last 1 Encounters:  07/24/21 65.4 kg    Ideal Body Weight:  47.7 kg  BMI:  Body mass index is 27.24 kg/m.  Estimated Nutritional Needs:   Kcal:  1800-2000  Protein:  90-110 grams  Fluid:  1.8 L/day    [name], RD, LDN, [place] Registered Dietitian II Certified Diabetes Care and Education Specialist Please refer to [facility] for RD and/or RD on-call/weekend/after hours pager
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
## 2021-07-26 DIAGNOSIS — J9621 Acute and chronic respiratory failure with hypoxia: Secondary | ICD-10-CM | POA: Diagnosis not present

## 2021-07-26 DIAGNOSIS — R569 Unspecified convulsions: Secondary | ICD-10-CM | POA: Diagnosis not present

## 2021-07-26 LAB — GLUCOSE, CAPILLARY
Glucose-Capillary: 121 mg/dL — ABNORMAL HIGH (ref 70–99)
Glucose-Capillary: 94 mg/dL (ref 70–99)
Glucose-Capillary: 290 mg/dL — ABNORMAL HIGH (ref 70–99)
Glucose-Capillary: 87 mg/dL (ref 70–99)
Glucose-Capillary: 93 mg/dL (ref 70–99)
Glucose-Capillary: 152 mg/dL — ABNORMAL HIGH (ref 70–99)

## 2021-07-26 LAB — OCCULT BLOOD X 1 CARD TO LAB, STOOL: Fecal Occult Bld: NEGATIVE

## 2021-07-26 NOTE — Progress Notes (Signed)
[facility]  PROGRESS NOTE  [name] HEN:[id number] DOB: 09-Nov-1965 DOA: 07/20/2021 PCP: Jodi Marble, MD   Brief HPI:   55 year old female with past medical history of CHF with recovered EF, CKD stage III, diabetes mellitus type 2, hypertension, atrial fibrillation, peripheral vascular disease, seizure presented to the ED on 9/25 from nursing facility where she was found unresponsive.  She was brought to the hospital from facility and in route she had a generalized tonic-clonic seizure.  She was given Versed 5 mg IV after which she required bag mask ventilation.  In the ED she was found to have inadequate respiratory effort so she was intubated.  Neurology was consulted.  CT head showed stable small left frontal meningioma and chronic small vessel disease.  She was extubated on 07/21/2021.  [facility] assumed care on 07/23/2021   Subjective   Patient seen and examined, denies any complaints.   Assessment/Plan:     Acute metabolic encephalopathy/seizure -Stat EEG obtained showed no epileptiform discharges -Neurology feels that this was a provoked seizure due to hypoglycemia -Currently on Keppra 500 mg p.o. twice daily -Neurology recommends to taper off Keppra as outpatient  UTI -Patient was found to have abnormal UA, was started on ceftriaxone empirically -Urine culture growing Enterococcus faecium -Urine culture is growing VRE 20,000 colonies per mL -Called and discussed with ID Dr. Linus Salmons, he says no need to treat as it is likely colonization -Patient is afebrile and denies dysuria.  Will not treat at this time.   We will discontinue antibiotics  Diabetes mellitus type 1 -Continue sliding scale insulin with NovoLog -CBG fairly well controlled  Malnutrition/failure to thrive -Patient has poor p.o. intake -Palliative care was consulted for goals of care and patient has decided not to pursue with feeding tube -She wants to be full code  Anemia -Hemoglobin is up to  8.5 today - hemoglobin was 9.5 on admission on 07/20/2021 -Check -FOBT, anemia panel is currently pending -Transfuse for hemoglobin less than 7. -She had EGD in January of this year which showed esophagitis -Continue Protonix 40 mg p.o. daily -Follow CBC in a.m.   Paroxysmal atrial fibrillation -Heart rate is controlled, continue anticoagulation with apixaban -Continue metoprolol  Bilateral lower extremity wounds -Present on admission -Wound care consulted   Data Reviewed:   CBG:  Recent Labs  Lab 07/25/21 1932 07/25/21 2323 07/26/21 0337 07/26/21 0734 07/26/21 1150  GLUCAP 98 92 121* 94 290*    SpO2: 96 % O2 Flow Rate (L/min): 2 L/min FiO2 (%): 30 %    Vitals:   07/26/21 0400 07/26/21 0742 07/26/21 1000 07/26/21 1151  BP:  (!) 142/62 111/85 117/83  Pulse:  84 94 86  Resp:  17 17 16   Temp:  97.9 F (36.6 C) 97.9 F (36.6 C) 98.3 F (36.8 C)  TempSrc:  Oral Oral Oral  SpO2:  97%  96%  Weight: 63.3 kg     Height:         Intake/Output Summary (Last 24 hours) at 07/26/2021 1613 Last data filed at 07/26/2021 0635 Gross per 24 hour  Intake 270 ml  Output 850 ml  Net -580 ml    09/29 1901 - 10/01 0700 In: 390 [P.O.:390] Out: 2150 [Urine:2150]  Filed Weights   07/23/21 0600 07/24/21 0500 07/26/21 0400  Weight: 68.6 kg 65.4 kg 63.3 kg    Data Reviewed: Basic Metabolic Panel: Recent Labs  Lab 07/20/21 1154 07/20/21 1203 07/20/21 1204 07/20/21 1228 07/20/21 1238 07/20/21 1459 07/20/21 1919  07/21/21 0017 07/21/21 0258 07/21/21 1838 07/22/21 0330  NA 145 147*   < >  --  149* 144  --  136 137  --  139  K 3.2* 2.7*   < >  --  <2.0* 2.1* 3.9 4.5 4.2  --  4.5  CL 116* 111  --   --   --  115*  --  111 110  --  112*  CO2 21*  --   --   --   --  24  --  21* 21*  --  22  GLUCOSE 90 74  --   --   --  169*  --  281* 286*  --  125*  BUN 12 13  --   --   --  11  --  11 11  --  22*  CREATININE 1.08* 1.00  --   --   --  1.10*  --  1.01* 1.05*  --  1.12*   CALCIUM 7.8*  --   --   --   --  7.5*  --  7.2* 7.4*  --  7.5*  MG  --   --   --  1.6*  --   --   --  2.1  --  1.9 1.9  PHOS  --   --   --   --   --   --   --   --  2.0* 2.9 2.9   < > = values in this interval not displayed.   Liver Function Tests: Recent Labs  Lab 07/20/21 1154 07/20/21 1459  AST 19 14*  ALT <5 12  ALKPHOS 63 67  BILITOT 1.7* 0.5  PROT 5.0* 4.9*  ALBUMIN 1.5* 1.5*   Recent Labs  Lab 07/20/21 1446  LIPASE 21  AMYLASE 56   No results for input(s): AMMONIA in the last 168 hours. CBC: Recent Labs  Lab 07/20/21 1154 07/20/21 1203 07/20/21 1204 07/20/21 1238 07/21/21 0258 07/22/21 0330 07/25/21 1411  WBC 7.0  --   --   --  10.7* 13.1* 6.9  NEUTROABS  --   --   --   --   --  9.5*  --   HGB 9.7*   < > 9.5* 9.2* 8.7* 7.0* 8.5*  HCT 31.0*   < > 28.0* 27.0* 27.9* 23.6* 28.4*  MCV 92.0  --   --   --  91.8 94.4 93.1  PLT 368  --   --   --  334 272 331   < > = values in this interval not displayed.   Cardiac Enzymes: No results for input(s): CKTOTAL, CKMB, CKMBINDEX, TROPONINI in the last 168 hours. BNP (last 3 results) Recent Labs    01/16/21 1055 03/09/21 1735  BNP 352.0* 87.0    ProBNP (last 3 results) No results for input(s): PROBNP in the last 8760 hours.  CBG: Recent Labs  Lab 07/25/21 1932 07/25/21 2323 07/26/21 0337 07/26/21 0734 07/26/21 1150  GLUCAP 98 92 121* 94 290*    Recent Results (from the past 240 hour(s))  Resp Panel by RT-PCR (Flu A&B, Covid) Nasopharyngeal Swab     Status: Abnormal   Collection Time: 07/20/21 12:03 PM   Specimen: Nasopharyngeal Swab; Nasopharyngeal(NP) swabs in vial transport medium  Result Value Ref Range Status   SARS Coronavirus 2 by RT PCR POSITIVE (A) NEGATIVE Final    Comment: RESULT CALLED TO, READ BACK BY AND VERIFIED WITH: RN R.HARDY ON [id number] AT [id number] BY E.PARRISH (  NOTE) SARS-CoV-2 target nucleic acids are DETECTED.  The SARS-CoV-2 RNA is generally detectable in upper  respiratory specimens during the acute phase of infection. Positive results are indicative of the presence of the identified virus, but do not rule out bacterial infection or co-infection with other pathogens not detected by the test. Clinical correlation with patient history and other diagnostic information is necessary to determine patient infection status. The expected result is Negative.  Fact Sheet for Patients: EntrepreneurPulse.com.au  Fact Sheet for Healthcare Providers: IncredibleEmployment.be  This test is not yet approved or cleared by the [place] FDA and  has been authorized for detection and/or diagnosis of SARS-CoV-2 by FDA under an Emergency Use Authorization (EUA).  This EUA will remain in effect (meaning this t est can be used) for the duration of  the COVID-19 declaration under Section 564(b)(1) of the Act, 21 U.S.C. section 360bbb-3(b)(1), unless the authorization is terminated or revoked sooner.     Influenza A by PCR NEGATIVE NEGATIVE Final   Influenza B by PCR NEGATIVE NEGATIVE Final    Comment: (NOTE) The Xpert Xpress SARS-CoV-2/FLU/RSV plus assay is intended as an aid in the diagnosis of influenza from Nasopharyngeal swab specimens and should not be used as a sole basis for treatment. Nasal washings and aspirates are unacceptable for Xpert Xpress SARS-CoV-2/FLU/RSV testing.  Fact Sheet for Patients: EntrepreneurPulse.com.au  Fact Sheet for Healthcare Providers: IncredibleEmployment.be  This test is not yet approved or cleared by the [place] FDA and has been authorized for detection and/or diagnosis of SARS-CoV-2 by FDA under an Emergency Use Authorization (EUA). This EUA will remain in effect (meaning this test can be used) for the duration of the COVID-19 declaration under Section 564(b)(1) of the Act, 21 U.S.C. section 360bbb-3(b)(1), unless the authorization is  terminated or revoked.  Performed at [facility], [place] [address], [place], [place] [address]   Blood culture (routine x 2)     Status: None   Collection Time: 07/20/21 12:28 PM   Specimen: BLOOD  Result Value Ref Range Status   Specimen Description BLOOD SITE NOT SPECIFIED  Final   Special Requests   Final    BOTTLES DRAWN AEROBIC AND ANAEROBIC Blood Culture adequate volume   Culture   Final    NO GROWTH 5 DAYS Performed at [facility], [address] [place], [place] [address]    Report Status 07/25/2021 FINAL  Final  MRSA Next Gen by PCR, Nasal     Status: None   Collection Time: 07/20/21  6:49 PM   Specimen: Nasal Mucosa; Nasal Swab  Result Value Ref Range Status   MRSA by PCR Next Gen NOT DETECTED NOT DETECTED Final    Comment: (NOTE) The GeneXpert MRSA Assay (FDA approved for NASAL specimens only), is one component of a comprehensive MRSA colonization surveillance program. It is not intended to diagnose MRSA infection nor to guide or monitor treatment for MRSA infections. Test performance is not FDA approved in patients less than 23 years old. Performed at [facility], [place] [address], [place], [place] [address]   Urine Culture     Status: Abnormal   Collection Time: 07/23/21 10:17 AM   Specimen: Urine, Clean Catch  Result Value Ref Range Status   Specimen Description URINE, CLEAN CATCH  Final   Special Requests   Final    NONE Performed at [facility], [place] [address] [place],  [address]    Culture (A)  Final  20,000 COLONIES/mL VANCOMYCIN RESISTANT ENTEROCOCCUS ISOLATED   Report Status 07/25/2021 FINAL  Final   Organism ID, Bacteria VANCOMYCIN RESISTANT ENTEROCOCCUS ISOLATED (A)  Final      Susceptibility   Vancomycin resistant enterococcus isolated - MIC*    AMPICILLIN >=32 RESISTANT Resistant     NITROFURANTOIN 128 RESISTANT Resistant     VANCOMYCIN >=32 RESISTANT Resistant     LINEZOLID 2 SENSITIVE Sensitive     *  20,000 COLONIES/mL VANCOMYCIN RESISTANT ENTEROCOCCUS ISOLATED     Radiology Reports  No results found.   Scheduled medications:    apixaban  5 mg Oral BID   aspirin EC  81 mg Oral Daily   benztropine  0.5 mg Oral BID   feeding supplement  237 mL Oral TID BM   FLUoxetine  20 mg Oral Daily   insulin aspart  0-9 Units Subcutaneous Q4H   levETIRAcetam  500 mg Oral BID   metoprolol succinate  50 mg Oral Daily   multivitamin with minerals  1 tablet Oral Daily   oxybutynin  5 mg Oral Daily   pantoprazole  40 mg Oral Daily   rosuvastatin  10 mg Oral QPM   sodium chloride flush  10-40 mL Intracatheter Q12H   thiamine  100 mg Oral Daily    Antibiotics: Anti-infectives (From admission, onward)    Start     Dose/Rate Route Frequency Ordered Stop   07/21/21 1000  cefTRIAXone (ROCEPHIN) 1 g in sodium chloride 0.9 % 100 mL IVPB        1 g 200 mL/hr over 30 Minutes Intravenous Every 24 hours 07/21/21 0903 07/24/21 0705         DVT prophylaxis: Apixaban  Code Status: Full code  Family Communication: No family at bedside   Consultants: Neurology  Procedures: Intubation from 07/20/2021 to 07/21/2021    Objective    Physical Examination:  General-appears in no acute distress Heart-S1-S2, regular, no murmur auscultated Lungs-clear to auscultation bilaterally, no wheezing or crackles auscultated Abdomen-soft, nontender, no organomegaly Extremities-no edema in the lower extremities Neuro-alert, oriented x3, no focal deficit noted  Status is: Inpatient  Dispo: The patient is from: Skilled nursing facility              Anticipated d/c is to: Skilled nursing facility              Anticipated d/c date is: 07-28-21              Patient currently not stable for discharge  Barrier to discharge-poor p.o. intake  COVID-19 Labs  No results for input(s): DDIMER, FERRITIN, LDH, CRP in the last 72 hours.  Lab Results  Component Value Date   SARSCOV2NAA POSITIVE (A)  07/20/2021   SARSCOV2NAA POSITIVE (A) 07/01/2021   [place] NEGATIVE 05/29/2021   [place] NEGATIVE 04/21/2021     Pressure Injury 12/03/20 Perineum Posterior Stage 1 -  Intact skin with non-blanchable redness of a localized area usually over a bony prominence. (Active)  12/03/20 2210  Location: Perineum  Location Orientation: Posterior  Staging: Stage 1 -  Intact skin with non-blanchable redness of a localized area usually over a bony prominence.  Wound Description (Comments):   Present on Admission: Yes     Pressure Injury 12/04/20 Coccyx Right;Medial Stage 2 -  Partial thickness loss of dermis presenting as a shallow open injury with a red, pink wound bed without slough. 1cm x 0.5cm area to the right of previously healed pressure ulcer (Active)  12/04/20 0800  Location:  Coccyx  Location Orientation: Right;Medial  Staging: Stage 2 -  Partial thickness loss of dermis presenting as a shallow open injury with a red, pink wound bed without slough.  Wound Description (Comments): 1cm x 0.5cm area to the right of previously healed pressure ulcer  Present on Admission: Yes     Pressure Injury 07/20/21 Heel Distal;Posterior;Right Unstageable - Full thickness tissue loss in which the base of the injury is covered by slough (yellow, tan, gray, green or brown) and/or eschar (tan, brown or black) in the wound bed. arterial dx, uncl (Active)  07/20/21 1640  Location: Heel  Location Orientation: Distal;Posterior;Right  Staging: Unstageable - Full thickness tissue loss in which the base of the injury is covered by slough (yellow, tan, gray, green or brown) and/or eschar (tan, brown or black) in the wound bed.  Wound Description (Comments): arterial dx, unclear etilogy  Present on Admission: Yes     Pressure Injury 07/23/21 Pretibial Distal;Left Unstageable - Full thickness tissue loss in which the base of the injury is covered by slough (yellow, tan, gray, green or brown) and/or eschar (tan,  brown or black) in the wound bed. (Active)  07/23/21 1900  Location: Pretibial  Location Orientation: Distal;Left  Staging: Unstageable - Full thickness tissue loss in which the base of the injury is covered by slough (yellow, tan, gray, green or brown) and/or eschar (tan, brown or black) in the wound bed.  Wound Description (Comments):   Present on Admission: Yes     Pressure Injury 07/23/21 Sacrum Right;Left Stage 2 -  Partial thickness loss of dermis presenting as a shallow open injury with a red, pink wound bed without slough. 4 scattered small areas pm sacrum and buttocks area (Active)  07/23/21 2148  Location: Sacrum  Location Orientation: Right;Left  Staging: Stage 2 -  Partial thickness loss of dermis presenting as a shallow open injury with a red, pink wound bed without slough.  Wound Description (Comments): 4 scattered small areas pm sacrum and buttocks area  Present on Admission:           [place]   [facility] If 7PM-7AM, please contact night-coverage at [email or web address], Office  [phone]   07/26/2021, 4:13 PM  LOS: 6 days

## 2021-07-27 DIAGNOSIS — R569 Unspecified convulsions: Secondary | ICD-10-CM | POA: Diagnosis not present

## 2021-07-27 DIAGNOSIS — J9621 Acute and chronic respiratory failure with hypoxia: Secondary | ICD-10-CM | POA: Diagnosis not present

## 2021-07-27 LAB — GLUCOSE, CAPILLARY
Glucose-Capillary: 104 mg/dL — ABNORMAL HIGH (ref 70–99)
Glucose-Capillary: 107 mg/dL — ABNORMAL HIGH (ref 70–99)
Glucose-Capillary: 131 mg/dL — ABNORMAL HIGH (ref 70–99)
Glucose-Capillary: 133 mg/dL — ABNORMAL HIGH (ref 70–99)
Glucose-Capillary: 137 mg/dL — ABNORMAL HIGH (ref 70–99)
Glucose-Capillary: 179 mg/dL — ABNORMAL HIGH (ref 70–99)
Glucose-Capillary: 307 mg/dL — ABNORMAL HIGH (ref 70–99)
Glucose-Capillary: 54 mg/dL — ABNORMAL LOW (ref 70–99)
Glucose-Capillary: 86 mg/dL (ref 70–99)

## 2021-07-27 MED ORDER — ACETAMINOPHEN 325 MG PO TABS
650.0000 mg | ORAL_TABLET | Freq: Once | ORAL | Status: AC
  Administered 2021-07-27: 650 mg via ORAL
  Filled 2021-07-27: qty 2

## 2021-07-27 MED ORDER — DEXTROSE 50 % IV SOLN
25.0000 g | INTRAVENOUS | Status: AC
  Administered 2021-07-27: 25 g via INTRAVENOUS
  Filled 2021-07-27: qty 50

## 2021-07-27 NOTE — Plan of Care (Signed)
  Problem: Clinical Measurements: Goal: Ability to maintain clinical measurements within normal limits will improve Outcome: Progressing Goal: Will remain free from infection Outcome: Progressing Goal: Diagnostic test results will improve Outcome: Progressing Goal: Respiratory complications will improve Outcome: Progressing Goal: Cardiovascular complication will be avoided Outcome: Progressing   Problem: Nutrition: Goal: Adequate nutrition will be maintained Outcome: Progressing   Problem: Elimination: Goal: Will not experience complications related to bowel motility Outcome: Progressing Goal: Will not experience complications related to urinary retention Outcome: Progressing   Problem: Pain Managment: Goal: General experience of comfort will improve Outcome: Progressing   Problem: Safety: Goal: Ability to remain free from injury will improve Outcome: Progressing   Problem: Skin Integrity: Goal: Risk for impaired skin integrity will decrease Outcome: Progressing

## 2021-07-27 NOTE — Progress Notes (Signed)
[facility]  PROGRESS NOTE  MACKENZYE MACKEL YIR:[id number] DOB: [birth year] DOA: 07/20/2021 PCP: Jodi Marble, MD   Brief HPI:   55 year old female with past medical history of CHF with recovered EF, CKD stage III, diabetes mellitus type 2, hypertension, atrial fibrillation, peripheral vascular disease, seizure presented to the ED on 9/25 from nursing facility where she was found unresponsive.  She was brought to the hospital from facility and in route she had a generalized tonic-clonic seizure.  She was given Versed 5 mg IV after which she required bag mask ventilation.  In the ED she was found to have inadequate respiratory effort so she was intubated.  Neurology was consulted.  CT head showed stable small left frontal meningioma and chronic small vessel disease.  She was extubated on 07/21/2021.  [facility] assumed care on 07/23/2021   Subjective   Patient seen and examined, no new complaints.   Assessment/Plan:     Acute metabolic encephalopathy/seizure -Improved -Stat EEG obtained showed no epileptiform discharges -Neurology feels that this was a provoked seizure due to hypoglycemia -Currently on Keppra 500 mg p.o. twice daily -Neurology recommends to taper off Keppra as outpatient  UTI -Patient was found to have abnormal UA, was started on ceftriaxone empirically -Urine culture growing Enterococcus faecium -Urine culture is growing VRE 20,000 colonies per mL -Called and discussed with ID Dr. Linus Salmons, he says no need to treat as it is likely colonization -Patient is afebrile and denies dysuria.  Will not treat at this time.   -Antibiotics are discontinued  Diabetes mellitus type 1 -Continue sliding scale insulin with NovoLog -CBG fairly well controlled  Malnutrition/failure to thrive -Patient has poor p.o. intake -Palliative care was consulted for goals of care and patient has decided not to pursue with feeding tube -She wants to be full code  Anemia -Hemoglobin is  up to 8.5  - hemoglobin was 9.5 on admission on 07/20/2021 -FOBT-negative -Transfuse for hemoglobin less than 7. -She had EGD in January of this year which showed esophagitis -Continue Protonix 40 mg p.o. daily   Paroxysmal atrial fibrillation -Heart rate is controlled, continue anticoagulation with apixaban -Continue metoprolol  Bilateral lower extremity wounds -Present on admission -Wound care consulted   Data Reviewed:   CBG:  Recent Labs  Lab 07/27/21 0442 07/27/21 0528 07/27/21 0610 07/27/21 0757 07/27/21 1200  GLUCAP 54* 179* 137* 133* 104*    SpO2: 98 % O2 Flow Rate (L/min): 2 L/min FiO2 (%): 30 %    Vitals:   07/27/21 0756 07/27/21 0800 07/27/21 1159 07/27/21 1200  BP: (!) 123/99 (!) 123/99 111/83 111/83  Pulse: 83  85   Resp: 17  16   Temp: 99.2 F (37.3 C)  98.2 F (36.8 C)   TempSrc: Oral  Oral   SpO2: 92%  98%   Weight:      Height:         Intake/Output Summary (Last 24 hours) at 07/27/2021 1305 Last data filed at 07/27/2021 0436 Gross per 24 hour  Intake 150 ml  Output 500 ml  Net -350 ml    09/30 1901 - 10/02 0700 In: 300 [P.O.:300] Out: 1200 [Urine:1200]  Filed Weights   07/24/21 0500 07/26/21 0400 07/27/21 0530  Weight: 65.4 kg 63.3 kg 63.1 kg    Data Reviewed: Basic Metabolic Panel: Recent Labs  Lab 07/20/21 1459 07/20/21 1919 07/21/21 0017 07/21/21 0258 07/21/21 1838 07/22/21 0330  NA 144  --  136 137  --  139  K 2.1*  3.9 4.5 4.2  --  4.5  CL 115*  --  111 110  --  112*  CO2 24  --  21* 21*  --  22  GLUCOSE 169*  --  281* 286*  --  125*  BUN 11  --  11 11  --  22*  CREATININE 1.10*  --  1.01* 1.05*  --  1.12*  CALCIUM 7.5*  --  7.2* 7.4*  --  7.5*  MG  --   --  2.1  --  1.9 1.9  PHOS  --   --   --  2.0* 2.9 2.9   Liver Function Tests: Recent Labs  Lab 07/20/21 1459  AST 14*  ALT 12  ALKPHOS 67  BILITOT 0.5  PROT 4.9*  ALBUMIN 1.5*   Recent Labs  Lab 07/20/21 1446  LIPASE 21  AMYLASE 56   No  results for input(s): AMMONIA in the last 168 hours. CBC: Recent Labs  Lab 07/21/21 0258 07/22/21 0330 07/25/21 1411  WBC 10.7* 13.1* 6.9  NEUTROABS  --  9.5*  --   HGB 8.7* 7.0* 8.5*  HCT 27.9* 23.6* 28.4*  MCV 91.8 94.4 93.1  PLT 334 272 331   Cardiac Enzymes: No results for input(s): CKTOTAL, CKMB, CKMBINDEX, TROPONINI in the last 168 hours. BNP (last 3 results) Recent Labs    01/16/21 1055 03/09/21 1735  BNP 352.0* 87.0    ProBNP (last 3 results) No results for input(s): PROBNP in the last 8760 hours.  CBG: Recent Labs  Lab 07/27/21 0442 07/27/21 0528 07/27/21 0610 07/27/21 0757 07/27/21 1200  GLUCAP 54* 179* 137* 133* 104*    Recent Results (from the past 240 hour(s))  Resp Panel by RT-PCR (Flu A&B, Covid) Nasopharyngeal Swab     Status: Abnormal   Collection Time: 07/20/21 12:03 PM   Specimen: Nasopharyngeal Swab; Nasopharyngeal(NP) swabs in vial transport medium  Result Value Ref Range Status   SARS Coronavirus 2 by RT PCR POSITIVE (A) NEGATIVE Final    Comment: RESULT CALLED TO, READ BACK BY AND VERIFIED WITH: RN R.HARDY ON [id number] AT 0569 BY E.PARRISH (NOTE) SARS-CoV-2 target nucleic acids are DETECTED.  The SARS-CoV-2 RNA is generally detectable in upper respiratory specimens during the acute phase of infection. Positive results are indicative of the presence of the identified virus, but do not rule out bacterial infection or co-infection with other pathogens not detected by the test. Clinical correlation with patient history and other diagnostic information is necessary to determine patient infection status. The expected result is Negative.  Fact Sheet for Patients: EntrepreneurPulse.com.au  Fact Sheet for Healthcare Providers: IncredibleEmployment.be  This test is not yet approved or cleared by the [place] FDA and  has been authorized for detection and/or diagnosis of SARS-CoV-2 by FDA under an  Emergency Use Authorization (EUA).  This EUA will remain in effect (meaning this t est can be used) for the duration of  the COVID-19 declaration under Section 564(b)(1) of the Act, 21 U.S.C. section 360bbb-3(b)(1), unless the authorization is terminated or revoked sooner.     Influenza A by PCR NEGATIVE NEGATIVE Final   Influenza B by PCR NEGATIVE NEGATIVE Final    Comment: (NOTE) The Xpert Xpress SARS-CoV-2/FLU/RSV plus assay is intended as an aid in the diagnosis of influenza from Nasopharyngeal swab specimens and should not be used as a sole basis for treatment. Nasal washings and aspirates are unacceptable for Xpert Xpress SARS-CoV-2/FLU/RSV testing.  Fact Sheet for Patients: EntrepreneurPulse.com.au  Fact  Sheet for Healthcare Providers: IncredibleEmployment.be  This test is not yet approved or cleared by the [place] and has been authorized for detection and/or diagnosis of SARS-CoV-2 by FDA under an Emergency Use Authorization (EUA). This EUA will remain in effect (meaning this test can be used) for the duration of the COVID-19 declaration under Section 564(b)(1) of the Act, 21 U.S.C. section 360bbb-3(b)(1), unless the authorization is terminated or revoked.  Performed at [facility], [place] [address] [place], [place] [address]   Blood culture (routine x 2)     Status: None   Collection Time: 07/20/21 12:28 PM   Specimen: BLOOD  Result Value Ref Range Status   Specimen Description BLOOD SITE NOT SPECIFIED  Final   Special Requests   Final    BOTTLES DRAWN AEROBIC AND ANAEROBIC Blood Culture adequate volume   Culture   Final    NO GROWTH 5 DAYS Performed at [facility], [address] [place], [place] [address]    Report Status 07/25/2021 FINAL  Final  MRSA Next Gen by PCR, Nasal     Status: None   Collection Time: 07/20/21  6:49 PM   Specimen: Nasal Mucosa; Nasal Swab  Result Value Ref Range Status    MRSA by PCR Next Gen NOT DETECTED NOT DETECTED Final    Comment: (NOTE) The GeneXpert MRSA Assay (FDA approved for NASAL specimens only), is one component of a comprehensive MRSA colonization surveillance program. It is not intended to diagnose MRSA infection nor to guide or monitor treatment for MRSA infections. Test performance is not FDA approved in patients less than 12 years old. Performed at [facility], [place] [address] [place], [place] [address]   Urine Culture     Status: Abnormal   Collection Time: 07/23/21 10:17 AM   Specimen: Urine, Clean Catch  Result Value Ref Range Status   Specimen Description URINE, CLEAN CATCH  Final   Special Requests   Final    NONE Performed at [facility], [place] [address] [place], [place] [address]    Culture (A)  Final    20,000 COLONIES/mL VANCOMYCIN RESISTANT ENTEROCOCCUS ISOLATED   Report Status 07/25/2021 FINAL  Final   Organism ID, Bacteria VANCOMYCIN RESISTANT ENTEROCOCCUS ISOLATED (A)  Final      Susceptibility   Vancomycin resistant enterococcus isolated - MIC*    AMPICILLIN >=32 RESISTANT Resistant     NITROFURANTOIN 128 RESISTANT Resistant     VANCOMYCIN >=32 RESISTANT Resistant     LINEZOLID 2 SENSITIVE Sensitive     * 20,000 COLONIES/mL VANCOMYCIN RESISTANT ENTEROCOCCUS ISOLATED     Radiology Reports  No results found.   Scheduled medications:    apixaban  5 mg Oral BID   aspirin EC  81 mg Oral Daily   benztropine  0.5 mg Oral BID   feeding supplement  237 mL Oral TID BM   FLUoxetine  20 mg Oral Daily   insulin aspart  0-9 Units Subcutaneous Q4H   levETIRAcetam  500 mg Oral BID   metoprolol succinate  50 mg Oral Daily   multivitamin with minerals  1 tablet Oral Daily   oxybutynin  5 mg Oral Daily   pantoprazole  40 mg Oral Daily   rosuvastatin  10 mg Oral QPM   sodium chloride flush  10-40 mL Intracatheter Q12H   thiamine  100 mg Oral Daily    Antibiotics: Anti-infectives (From admission, onward)     Start  Dose/Rate Route Frequency Ordered Stop   07/21/21 1000  cefTRIAXone (ROCEPHIN) 1 g in sodium chloride 0.9 % 100 mL IVPB        1 g 200 mL/hr over 30 Minutes Intravenous Every 24 hours 07/21/21 0903 07/24/21 0705         DVT prophylaxis: Apixaban  Code Status: Full code  Family Communication: No family at bedside   Consultants: Neurology  Procedures: Intubation from 07/20/2021 to 07/21/2021    Objective    Physical Examination:  General-appears in no acute distress Heart-S1-S2, regular, no murmur auscultated Lungs-clear to auscultation bilaterally, no wheezing or crackles auscultated Abdomen-soft, nontender, no organomegaly Extremities-no edema in the lower extremities, dressing in place in lower extremities bilaterally Neuro-alert, oriented x3, no focal deficit noted  Status is: Inpatient  Dispo: The patient is from: Skilled nursing facility              Anticipated d/c is to: Skilled nursing facility              Anticipated d/c date is: 07-28-21              Patient currently not stable for discharge  Barrier to discharge-poor p.o. intake  COVID-19 Labs  No results for input(s): DDIMER, FERRITIN, LDH, CRP in the last 72 hours.  Lab Results  Component Value Date   SARSCOV2NAA POSITIVE (A) 07/20/2021   SARSCOV2NAA POSITIVE (A) 07/01/2021   [place] NEGATIVE 05/29/2021   [place] NEGATIVE 04/21/2021     Pressure Injury 12/03/20 Perineum Posterior Stage 1 -  Intact skin with non-blanchable redness of a localized area usually over a bony prominence. (Active)  12/03/20 2210  Location: Perineum  Location Orientation: Posterior  Staging: Stage 1 -  Intact skin with non-blanchable redness of a localized area usually over a bony prominence.  Wound Description (Comments):   Present on Admission: Yes     Pressure Injury 12/04/20 Coccyx Right;Medial Stage 2 -  Partial thickness loss of dermis presenting as a shallow open injury with a red,  pink wound bed without slough. 1cm x 0.5cm area to the right of previously healed pressure ulcer (Active)  12/04/20 0800  Location: Coccyx  Location Orientation: Right;Medial  Staging: Stage 2 -  Partial thickness loss of dermis presenting as a shallow open injury with a red, pink wound bed without slough.  Wound Description (Comments): 1cm x 0.5cm area to the right of previously healed pressure ulcer  Present on Admission: Yes     Pressure Injury 07/20/21 Heel Distal;Posterior;Right Unstageable - Full thickness tissue loss in which the base of the injury is covered by slough (yellow, tan, gray, green or brown) and/or eschar (tan, brown or black) in the wound bed. arterial dx, uncl (Active)  07/20/21 1640  Location: Heel  Location Orientation: Distal;Posterior;Right  Staging: Unstageable - Full thickness tissue loss in which the base of the injury is covered by slough (yellow, tan, gray, green or brown) and/or eschar (tan, brown or black) in the wound bed.  Wound Description (Comments): arterial dx, unclear etilogy  Present on Admission: Yes     Pressure Injury 07/23/21 Pretibial Distal;Left Unstageable - Full thickness tissue loss in which the base of the injury is covered by slough (yellow, tan, gray, green or brown) and/or eschar (tan, brown or black) in the wound bed. (Active)  07/23/21 1900  Location: Pretibial  Location Orientation: Distal;Left  Staging: Unstageable - Full thickness tissue loss in which the base of the injury is covered by slough (yellow,  tan, gray, green or brown) and/or eschar (tan, brown or black) in the wound bed.  Wound Description (Comments):   Present on Admission: Yes     Pressure Injury 07/23/21 Sacrum Right;Left Stage 2 -  Partial thickness loss of dermis presenting as a shallow open injury with a red, pink wound bed without slough. 4 scattered small areas pm sacrum and buttocks area (Active)  07/23/21 2148  Location: Sacrum  Location Orientation:  Right;Left  Staging: Stage 2 -  Partial thickness loss of dermis presenting as a shallow open injury with a red, pink wound bed without slough.  Wound Description (Comments): 4 scattered small areas pm sacrum and buttocks area  Present on Admission:      [place]   [facility] If 7PM-7AM, please contact night-coverage at [email or web address], Office  [phone]   07/27/2021, 1:05 PM  LOS: 7 days

## 2021-07-27 NOTE — Progress Notes (Signed)
Hypoglycemic Event  CBG: 54  Treatment: D50 50 mL (25 gm)  Symptoms:  Drowsy  Follow-up CBG: Time: 0528 CBG Result: 179  Possible Reasons for Event: Medication regimen: Novolog 5 units given for CBG 307 last night. Patient did not eat.  Comments/MD notified: Hal Hope, MD notified via secure chat at [phone]. MD called this RN. CBG to be checked in 30 minutes.    Sylvie Farrier

## 2021-07-28 DIAGNOSIS — E162 Hypoglycemia, unspecified: Secondary | ICD-10-CM | POA: Diagnosis not present

## 2021-07-28 DIAGNOSIS — J9621 Acute and chronic respiratory failure with hypoxia: Secondary | ICD-10-CM | POA: Diagnosis not present

## 2021-07-28 DIAGNOSIS — R569 Unspecified convulsions: Secondary | ICD-10-CM | POA: Diagnosis not present

## 2021-07-28 DIAGNOSIS — E876 Hypokalemia: Secondary | ICD-10-CM | POA: Diagnosis not present

## 2021-07-28 LAB — GLUCOSE, CAPILLARY
Glucose-Capillary: 135 mg/dL — ABNORMAL HIGH (ref 70–99)
Glucose-Capillary: 207 mg/dL — ABNORMAL HIGH (ref 70–99)
Glucose-Capillary: 64 mg/dL — ABNORMAL LOW (ref 70–99)
Glucose-Capillary: 83 mg/dL (ref 70–99)
Glucose-Capillary: 85 mg/dL (ref 70–99)
Glucose-Capillary: 97 mg/dL (ref 70–99)

## 2021-07-28 NOTE — Progress Notes (Signed)
[facility]  PROGRESS NOTE  [name] KDT:[id number] DOB: 04-23-1966 DOA: 07/20/2021 PCP: Jodi Marble, MD   Brief HPI:   55 year old female with past medical history of CHF with recovered EF, CKD stage III, diabetes mellitus type 2, hypertension, atrial fibrillation, peripheral vascular disease, seizure presented to the ED on 9/25 from nursing facility where she was found unresponsive.  She was brought to the hospital from facility and in route she had a generalized tonic-clonic seizure.  She was given Versed 5 mg IV after which she required bag mask ventilation.  In the ED she was found to have inadequate respiratory effort so she was intubated.  Neurology was consulted.  CT head showed stable small left frontal meningioma and chronic small vessel disease.  She was extubated on 07/21/2021.  [facility] assumed care on 07/23/2021   Subjective   Patient seen and examined, became hypoglycemic last night.  Eating breakfast this morning.   Assessment/Plan:     Acute metabolic encephalopathy/seizure -Improved -Stat EEG obtained showed no epileptiform discharges -Neurology feels that this was a provoked seizure due to hypoglycemia -Currently on Keppra 500 mg p.o. twice daily -Neurology recommends to taper off Keppra as outpatient  UTI -Patient was found to have abnormal UA, was started on ceftriaxone empirically -Urine culture growing Enterococcus faecium -Urine culture is growing VRE 20,000 colonies per mL -Called and discussed with ID Dr. Linus Salmons, he says no need to treat as it is likely colonization -Patient is afebrile and denies dysuria.  Will not treat at this time.   -Antibiotics are discontinued  Diabetes mellitus type 1 -Continue sliding scale insulin with NovoLog -CBG fairly well controlled  Malnutrition/failure to thrive -Recurrent episodes of hypoglycemia -Patient has poor p.o. intake -Palliative care was consulted for goals of care and patient has decided not  to pursue with feeding tube -She wants to be full code  Anemia -Hemoglobin is up to 8.5  - hemoglobin was 9.5 on admission on 07/20/2021 -FOBT-negative -Transfuse for hemoglobin less than 7. -She had EGD in January of this year which showed esophagitis -Continue Protonix 40 mg p.o. daily   Paroxysmal atrial fibrillation -Heart rate is controlled, continue anticoagulation with apixaban -Continue metoprolol  Bilateral lower extremity wounds -Present on admission -Wound care consulted   Data Reviewed:   CBG:  Recent Labs  Lab 07/27/21 2156 07/27/21 2342 07/28/21 0440 07/28/21 0812 07/28/21 1213  GLUCAP 86 107* 83 85 97    SpO2: 100 % O2 Flow Rate (L/min): 2 L/min FiO2 (%): 30 %    Vitals:   07/28/21 0400 07/28/21 0500 07/28/21 0810 07/28/21 1208  BP: (!) 156/92  (!) 144/94 129/88  Pulse: 94  88 85  Resp: 16  15 18   Temp: 98.5 F (36.9 C)  98.3 F (36.8 C) 98.2 F (36.8 C)  TempSrc: Axillary  Axillary Oral  SpO2: 98%  99% 100%  Weight:  63.5 kg    Height:         Intake/Output Summary (Last 24 hours) at 07/28/2021 1233 Last data filed at 07/28/2021 0500 Gross per 24 hour  Intake 90 ml  Output 150 ml  Net -60 ml    10/01 1901 - 10/03 0700 In: 240 [P.O.:240] Out: 650 [Urine:650]  Filed Weights   07/26/21 0400 07/27/21 0530 07/28/21 0500  Weight: 63.3 kg 63.1 kg 63.5 kg    Data Reviewed: Basic Metabolic Panel: Recent Labs  Lab 07/21/21 1838 07/22/21 0330  NA  --  139  K  --  4.5  CL  --  112*  CO2  --  22  GLUCOSE  --  125*  BUN  --  22*  CREATININE  --  1.12*  CALCIUM  --  7.5*  MG 1.9 1.9  PHOS 2.9 2.9   Liver Function Tests: No results for input(s): AST, ALT, ALKPHOS, BILITOT, PROT, ALBUMIN in the last 168 hours.  No results for input(s): LIPASE, AMYLASE in the last 168 hours.  No results for input(s): AMMONIA in the last 168 hours. CBC: Recent Labs  Lab 07/22/21 0330 07/25/21 1411  WBC 13.1* 6.9  NEUTROABS 9.5*  --   HGB  7.0* 8.5*  HCT 23.6* 28.4*  MCV 94.4 93.1  PLT 272 331   Cardiac Enzymes: No results for input(s): CKTOTAL, CKMB, CKMBINDEX, TROPONINI in the last 168 hours. BNP (last 3 results) Recent Labs    01/16/21 1055 03/09/21 1735  BNP 352.0* 87.0    ProBNP (last 3 results) No results for input(s): PROBNP in the last 8760 hours.  CBG: Recent Labs  Lab 07/27/21 2156 07/27/21 2342 07/28/21 0440 07/28/21 0812 07/28/21 1213  GLUCAP 86 107* 83 85 97    Recent Results (from the past 240 hour(s))  Resp Panel by RT-PCR (Flu A&B, Covid) Nasopharyngeal Swab     Status: Abnormal   Collection Time: 07/20/21 12:03 PM   Specimen: Nasopharyngeal Swab; Nasopharyngeal(NP) swabs in vial transport medium  Result Value Ref Range Status   SARS Coronavirus 2 by RT PCR POSITIVE (A) NEGATIVE Final    Comment: RESULT CALLED TO, READ BACK BY AND VERIFIED WITH: RN R.HARDY ON [id number] AT [id number] BY E.PARRISH (NOTE) SARS-CoV-2 target nucleic acids are DETECTED.  The SARS-CoV-2 RNA is generally detectable in upper respiratory specimens during the acute phase of infection. Positive results are indicative of the presence of the identified virus, but do not rule out bacterial infection or co-infection with other pathogens not detected by the test. Clinical correlation with patient history and other diagnostic information is necessary to determine patient infection status. The expected result is Negative.  Fact Sheet for Patients: EntrepreneurPulse.com.au  Fact Sheet for Healthcare Providers: IncredibleEmployment.be  This test is not yet approved or cleared by the [place] FDA and  has been authorized for detection and/or diagnosis of SARS-CoV-2 by FDA under an Emergency Use Authorization (EUA).  This EUA will remain in effect (meaning this t est can be used) for the duration of  the COVID-19 declaration under Section 564(b)(1) of the Act, 21 U.S.C. section  360bbb-3(b)(1), unless the authorization is terminated or revoked sooner.     Influenza A by PCR NEGATIVE NEGATIVE Final   Influenza B by PCR NEGATIVE NEGATIVE Final    Comment: (NOTE) The Xpert Xpress SARS-CoV-2/FLU/RSV plus assay is intended as an aid in the diagnosis of influenza from Nasopharyngeal swab specimens and should not be used as a sole basis for treatment. Nasal washings and aspirates are unacceptable for Xpert Xpress SARS-CoV-2/FLU/RSV testing.  Fact Sheet for Patients: EntrepreneurPulse.com.au  Fact Sheet for Healthcare Providers: IncredibleEmployment.be  This test is not yet approved or cleared by the [place] FDA and has been authorized for detection and/or diagnosis of SARS-CoV-2 by FDA under an Emergency Use Authorization (EUA). This EUA will remain in effect (meaning this test can be used) for the duration of the COVID-19 declaration under Section 564(b)(1) of the Act, 21 U.S.C. section 360bbb-3(b)(1), unless the authorization is terminated or revoked.  Performed at [facility], [place] [address],  [place], [place] [address]   Blood culture (routine x 2)     Status: None   Collection Time: 07/20/21 12:28 PM   Specimen: BLOOD  Result Value Ref Range Status   Specimen Description BLOOD SITE NOT SPECIFIED  Final   Special Requests   Final    BOTTLES DRAWN AEROBIC AND ANAEROBIC Blood Culture adequate volume   Culture   Final    NO GROWTH 5 DAYS Performed at [facility], [address], [place], [place] [address]    Report Status 07/25/2021 FINAL  Final  MRSA Next Gen by PCR, Nasal     Status: None   Collection Time: 07/20/21  6:49 PM   Specimen: Nasal Mucosa; Nasal Swab  Result Value Ref Range Status   MRSA by PCR Next Gen NOT DETECTED NOT DETECTED Final    Comment: (NOTE) The GeneXpert MRSA Assay (FDA approved for NASAL specimens only), is one component of a comprehensive MRSA colonization  surveillance program. It is not intended to diagnose MRSA infection nor to guide or monitor treatment for MRSA infections. Test performance is not FDA approved in patients less than 37 years old. Performed at [facility], [place] [address] [place], [place] [address]   Urine Culture     Status: Abnormal   Collection Time: 07/23/21 10:17 AM   Specimen: Urine, Clean Catch  Result Value Ref Range Status   Specimen Description URINE, CLEAN CATCH  Final   Special Requests   Final    NONE Performed at [facility], [place] [address], [place], [place] [address]    Culture (A)  Final    20,000 COLONIES/mL VANCOMYCIN RESISTANT ENTEROCOCCUS ISOLATED   Report Status 07/25/2021 FINAL  Final   Organism ID, Bacteria VANCOMYCIN RESISTANT ENTEROCOCCUS ISOLATED (A)  Final      Susceptibility   Vancomycin resistant enterococcus isolated - MIC*    AMPICILLIN >=32 RESISTANT Resistant     NITROFURANTOIN 128 RESISTANT Resistant     VANCOMYCIN >=32 RESISTANT Resistant     LINEZOLID 2 SENSITIVE Sensitive     * 20,000 COLONIES/mL VANCOMYCIN RESISTANT ENTEROCOCCUS ISOLATED     Radiology Reports  No results found.   Scheduled medications:    apixaban  5 mg Oral BID   aspirin EC  81 mg Oral Daily   benztropine  0.5 mg Oral BID   feeding supplement  237 mL Oral TID BM   FLUoxetine  20 mg Oral Daily   insulin aspart  0-9 Units Subcutaneous Q4H   levETIRAcetam  500 mg Oral BID   metoprolol succinate  50 mg Oral Daily   multivitamin with minerals  1 tablet Oral Daily   oxybutynin  5 mg Oral Daily   pantoprazole  40 mg Oral Daily   rosuvastatin  10 mg Oral QPM   sodium chloride flush  10-40 mL Intracatheter Q12H   thiamine  100 mg Oral Daily    Antibiotics: Anti-infectives (From admission, onward)    Start     Dose/Rate Route Frequency Ordered Stop   07/21/21 1000  cefTRIAXone (ROCEPHIN) 1 g in sodium chloride 0.9 % 100 mL IVPB        1 g 200 mL/hr over 30 Minutes Intravenous Every 24  hours 07/21/21 0903 07/24/21 0705         DVT prophylaxis: Apixaban  Code Status: Full code  Family Communication: No family at bedside   Consultants: Neurology  Procedures: Intubation from 07/20/2021 to 07/21/2021  Objective    Physical Examination:  General-appears in no acute distress Heart-S1-S2, regular, no murmur auscultated Lungs-clear to auscultation bilaterally, no wheezing or crackles auscultated Abdomen-soft, nontender, no organomegaly Extremities-dressing in place in both lower extremities Neuro-alert, oriented x3, no focal deficit noted  Status is: Inpatient  Dispo: The patient is from: Skilled nursing facility              Anticipated d/c is to: Skilled nursing facility              Anticipated d/c date is: 08-01-21              Patient currently stable for discharge  Barrier to discharge-awaiting bed at skilled nursing facility  COVID-19 Labs  No results for input(s): DDIMER, FERRITIN, LDH, CRP in the last 72 hours.  Lab Results  Component Value Date   SARSCOV2NAA POSITIVE (A) 07/20/2021   SARSCOV2NAA POSITIVE (A) 07/01/2021   [place] NEGATIVE 05/29/2021   [place] NEGATIVE 04/21/2021     Pressure Injury 12/03/20 Perineum Posterior Stage 1 -  Intact skin with non-blanchable redness of a localized area usually over a bony prominence. (Active)  12/03/20 2210  Location: Perineum  Location Orientation: Posterior  Staging: Stage 1 -  Intact skin with non-blanchable redness of a localized area usually over a bony prominence.  Wound Description (Comments):   Present on Admission: Yes     Pressure Injury 12/04/20 Coccyx Right;Medial Stage 2 -  Partial thickness loss of dermis presenting as a shallow open injury with a red, pink wound bed without slough. 1cm x 0.5cm area to the right of previously healed pressure ulcer (Active)  12/04/20 0800  Location: Coccyx  Location Orientation: Right;Medial  Staging: Stage 2 -  Partial thickness  loss of dermis presenting as a shallow open injury with a red, pink wound bed without slough.  Wound Description (Comments): 1cm x 0.5cm area to the right of previously healed pressure ulcer  Present on Admission: Yes     Pressure Injury 07/20/21 Heel Distal;Posterior;Right Unstageable - Full thickness tissue loss in which the base of the injury is covered by slough (yellow, tan, gray, green or brown) and/or eschar (tan, brown or black) in the wound bed. arterial dx, uncl (Active)  07/20/21 1640  Location: Heel  Location Orientation: Distal;Posterior;Right  Staging: Unstageable - Full thickness tissue loss in which the base of the injury is covered by slough (yellow, tan, gray, green or brown) and/or eschar (tan, brown or black) in the wound bed.  Wound Description (Comments): arterial dx, unclear etilogy  Present on Admission: Yes     Pressure Injury 07/23/21 Pretibial Distal;Left Unstageable - Full thickness tissue loss in which the base of the injury is covered by slough (yellow, tan, gray, green or brown) and/or eschar (tan, brown or black) in the wound bed. (Active)  07/23/21 1900  Location: Pretibial  Location Orientation: Distal;Left  Staging: Unstageable - Full thickness tissue loss in which the base of the injury is covered by slough (yellow, tan, gray, green or brown) and/or eschar (tan, brown or black) in the wound bed.  Wound Description (Comments):   Present on Admission: Yes     Pressure Injury 07/23/21 Sacrum Right;Left Stage 2 -  Partial thickness loss of dermis presenting as a shallow open injury with a red, pink wound bed without slough. 4 scattered small areas pm sacrum and buttocks area (Active)  07/23/21 2148  Location: Sacrum  Location Orientation: Right;Left  Staging: Stage 2 -  Partial thickness  loss of dermis presenting as a shallow open injury with a red, pink wound bed without slough.  Wound Description (Comments): 4 scattered small areas pm sacrum and buttocks  area  Present on Admission:      [place]   [facility] If 7PM-7AM, please contact night-coverage at [email or web address], Office  [phone]   07/28/2021, 12:33 PM  LOS: 8 days

## 2021-07-28 NOTE — Progress Notes (Signed)
Pt and pt's son informed this nurse that pt does not want to be discharged back to [place]. Son stated he wants pt to discharged to another SNF, it not able to find another SNF then he would take his mother home with him. Social worker made aware.

## 2021-07-28 NOTE — TOC Progression Note (Addendum)
Transition of Care [facility]) - Progression Note    Patient Details  Name: [name] MRN: [id number] Date of Birth: 02-Aug-1966  Transition of Care [facility]) CM/SW [place], [place] Phone Number: 07/28/2021, 8:56 AM  Clinical Narrative:    9am-CSW waiting on a response from [place]; [place] again explained that therapy has not seen patient as it is not appropriate and there is no way to even initiate an insurance authorization without therapy or medical needs. Will escalate to [facility] leadership if adequate response is not given.    12pm-[place] [facility] Ramus stating they are still seeking approval.   3pm-Patient and son not wanting to return to [place]; barrier is need for long term care bed through patient's Medicaid. CSW faxed out referral to other SNFs. CSW left vm for [facility] APS ([phone]); they have a court hearing today regarding Interim Guardianship. Son reporting that he can take patient home if necessary though he works. CSW unsure if APS would allow that; pending a call back.     Expected Discharge Plan: Long Term Nursing Home Barriers to Discharge: No Barriers Identified  Expected Discharge Plan and Services Expected Discharge Plan: Long Term Nursing Home In-house Referral: Clinical Social Work   Post Acute Care Choice: [place] Living arrangements for the past 2 months: [place]                                       Social Determinants of Health (SDOH) Interventions    Readmission Risk Interventions Readmission Risk Prevention Plan 07/25/2021 10/31/2020 08/27/2020  Transportation Screening Complete Complete Complete  Medication Review Press photographer) Complete Complete Complete  PCP or Specialist appointment within 3-5 days of discharge Complete - Complete  HRI or Home Care Consult Complete - Complete  SW Recovery Care/Counseling Consult Complete Complete Complete  Palliative Care Screening Not Applicable  Not Applicable Not Applicable  Skilled Nursing Facility Complete Complete Complete  Some recent data might be hidden

## 2021-07-29 DIAGNOSIS — E162 Hypoglycemia, unspecified: Secondary | ICD-10-CM | POA: Diagnosis not present

## 2021-07-29 DIAGNOSIS — R569 Unspecified convulsions: Secondary | ICD-10-CM | POA: Diagnosis not present

## 2021-07-29 DIAGNOSIS — J9621 Acute and chronic respiratory failure with hypoxia: Secondary | ICD-10-CM | POA: Diagnosis not present

## 2021-07-29 LAB — GLUCOSE, CAPILLARY
Glucose-Capillary: 120 mg/dL — ABNORMAL HIGH (ref 70–99)
Glucose-Capillary: 126 mg/dL — ABNORMAL HIGH (ref 70–99)
Glucose-Capillary: 146 mg/dL — ABNORMAL HIGH (ref 70–99)
Glucose-Capillary: 161 mg/dL — ABNORMAL HIGH (ref 70–99)
Glucose-Capillary: 195 mg/dL — ABNORMAL HIGH (ref 70–99)
Glucose-Capillary: 98 mg/dL (ref 70–99)

## 2021-07-29 MED ORDER — LEVETIRACETAM 500 MG PO TABS: 500.0000 mg | ORAL_TABLET | Freq: Two times a day (BID) | ORAL | Status: DC

## 2021-07-29 MED ORDER — THIAMINE HCL 100 MG PO TABS: 100.0000 mg | ORAL_TABLET | Freq: Every day | ORAL | Status: DC

## 2021-07-29 MED ORDER — ADULT MULTIVITAMIN W/MINERALS CH: 1.0000 | ORAL_TABLET | Freq: Every day | ORAL | Status: DC

## 2021-07-29 MED ORDER — ENSURE ENLIVE PO LIQD
237.0000 mL | Freq: Three times a day (TID) | ORAL | 12 refills | Status: DC
Start: 2021-07-29 — End: 2023-04-15

## 2021-07-29 NOTE — TOC Transition Note (Signed)
Transition of Care [facility]) - CM/SW Discharge Note   Patient Details  Name: [name] MRN: [id number] Date of Birth: 1965-11-14  Transition of Care [facility]) CM/SW Contact:  Benard Halsted, LCSW Phone Number: 07/29/2021, 4:12 PM   Clinical Narrative:    Patient will DC to: [facility] Anticipated DC date: 07/29/21 Family notified: [place] DSS (B[place]) and son Transport by: Corey Harold   Per MD patient ready for DC to [facility]. RN to call report prior to discharge ([phone]). RN, patient, patient's family, and facility notified of DC. Discharge Summary and FL2 sent to facility. DC packet on chart. Ambulance transport requested for patient.   CSW will sign off for now as social work intervention is no longer needed. Please consult [name] again if new needs arise.     Final next level of care: Long Term Nursing Home Barriers to Discharge: No Barriers Identified   Patient Goals and CMS Choice Patient states their goals for this hospitalization and ongoing recovery are:: Return to SNF CMS Medicare.gov Compare Post Acute Care list provided to:: Patient Represenative (must comment) Choice offered to / list presented to : Adult Children  Discharge Placement   Existing PASRR number confirmed : 07/29/21            Patient to be transferred to facility by: [place] Name of family member notified: [place] DSS and son Patient and family notified of of transfer: 07/29/21  Discharge Plan and Services In-house Referral: Clinical Social Work   Post Acute Care Choice: [place]                               Social Determinants of Health (SDOH) Interventions     Readmission Risk Interventions Readmission Risk Prevention Plan 07/25/2021 10/31/2020 08/27/2020  Transportation Screening Complete Complete Complete  Medication Review Press photographer) Complete Complete Complete  PCP or Specialist appointment within 3-5 days of discharge Complete - Complete   HRI or Home Care Consult Complete - Complete  SW Recovery Care/Counseling Consult Complete Complete Complete  Palliative Care Screening Not Applicable Not Applicable Not Applicable  Skilled Nursing Facility Complete Complete Complete  Some recent data might be hidden

## 2021-07-29 NOTE — Progress Notes (Signed)
[place] x2 attempt. No answer.

## 2021-07-29 NOTE — TOC Progression Note (Addendum)
Transition of Care [facility]) - Progression Note    Patient Details  Name: [name] MRN: [id number] Date of Birth: [birth year]  Transition of Care [facility]) CM/SW [place], LCSW Phone Number: 07/29/2021, 10:07 AM  Clinical Narrative:    10am-CSW spoke with APS Morey Hummingbird William Hamburger x [phone]). They have obtained Interim Guardianship (court order from [place] on hard chart) and are now patient's decision makers. They have stopped patient's check from going to her son and will contact [place] to see if that is the reason they have been resistant to taking her back (son not paying monthly pml). No other bed offers exist.   2:30pm-CSW received notification that APS contacted [facility] and they are able to accept patient back through her Medicaid. CSW spoke with Shirlee Limerick at [facility] and they are able to accept patient back today. No COVID test needed as patient recently had COVID. CSW notified Morey Hummingbird with APS.  2:55pm-CSW received calls from patient's son asking why he was not allowed to have access to patient's info. CSW alerted him that [place] has taken Guardianship of patient and that he is not able to be her decision maker anymore. CSW provided him with Carrie's contact info as she directed CSW. He asked CSW why patient cannot come home with him and CSW directed him to [facility] for that answer. MD and RN aware.   Expected Discharge Plan: Long Term Nursing Home Barriers to Discharge: No Barriers Identified  Expected Discharge Plan and Services Expected Discharge Plan: Long Term Nursing Home In-house Referral: Clinical Social Work   Post Acute Care Choice: [place] Living arrangements for the past 2 months: [place]                                       Social Determinants of Health (SDOH) Interventions    Readmission Risk Interventions Readmission Risk Prevention Plan 07/25/2021 10/31/2020 08/27/2020  Transportation Screening  Complete Complete Complete  Medication Review Press photographer) Complete Complete Complete  PCP or Specialist appointment within 3-5 days of discharge Complete - Complete  HRI or Home Care Consult Complete - Complete  SW Recovery Care/Counseling Consult Complete Complete Complete  Palliative Care Screening Not Applicable Not Applicable Not Applicable  Skilled Nursing Facility Complete Complete Complete  Some recent data might be hidden

## 2021-07-29 NOTE — Progress Notes (Signed)
Patient is refusing to be transported to [facility]. Patient said that the only way that she would allow transport to take her is if they were taking her to her son's residence. Son wanted to take her but was not given the permission as she is now a ward of DSS. Social worker notified via secure chat. Physician Hal Hope notified and said to hold off until the morning.

## 2021-07-29 NOTE — Progress Notes (Signed)
Attempted to call report to [facility] x1 attempt. No answer.

## 2021-07-29 NOTE — Discharge Summary (Signed)
Physician Discharge Summary  [name] QMV:[id number] DOB: April 25, 1966 DOA: 07/20/2021  PCP: Jodi Marble, MD  Admit date: 07/20/2021 Discharge date: 07/29/2021  Time spent: 60 minutes  Recommendations for Outpatient Follow-up:  Follow-up neurology as outpatient Follow-up PCP in 2 weeks Stop Trulicity, Lantus, oral hypoglycemic agent as patient has poor p.o. intake and at risk for hypoglycemia  Discharge Diagnoses:  Active Problems:   Acute on chronic respiratory failure with hypoxia [facility])   Discharge Condition: Stable  Diet recommendation: Heart healthy diet  Filed Weights   07/26/21 0400 07/27/21 0530 07/28/21 0500  Weight: 63.3 kg 63.1 kg 63.5 kg    History of present illness:  55 year old female with past medical history of CHF with recovered EF, CKD stage III, diabetes mellitus type 2, hypertension, atrial fibrillation, peripheral vascular disease, seizure presented to the ED on 9/25 from nursing facility where she was found unresponsive.  She was brought to the hospital from facility and in route she had a generalized tonic-clonic seizure.  She was given Versed 5 mg IV after which she required bag mask ventilation.  In the ED she was found to have inadequate respiratory effort so she was intubated.  Neurology was consulted.  CT head showed stable small left frontal meningioma and chronic small vessel disease.  She was extubated on 07/21/2021.    Hospital Course:   Acute metabolic encephalopathy/seizure -Improved -Stat EEG obtained showed no epileptiform discharges -Neurology feels that this was a provoked seizure due to hypoglycemia -Currently on Keppra 500 mg p.o. twice daily -Neurology recommends to taper off Keppra as outpatient -Follow-up neurology as outpatient   UTI -Patient was found to have abnormal UA, was started on ceftriaxone empirically -Urine culture growing Enterococcus faecium -Urine culture is growing VRE 20,000 colonies per mL -Called and  discussed with ID Dr. Linus Salmons, he says no need to treat as it is likely colonization -Patient is afebrile and denies dysuria.  Will not treat at this time.   -Antibiotics were discontinued   Diabetes mellitus type 1 -Continue sliding scale insulin with NovoLog -CBG fairly well controlled -Stop Trulicity, Lantus, alogliptin as patient has poor p.o. intake   Malnutrition/failure to thrive -Recurrent episodes of hypoglycemia -Patient has poor p.o. intake -Palliative care was consulted for goals of care and patient has decided not to pursue with feeding tube -She wants to be full code   Anemia -Hemoglobin is up to 8.5  - hemoglobin was 9.5 on admission on 07/20/2021 -Stool for occult blood is negative -She had EGD in January of this year which showed esophagitis -Continue PPI daily    Paroxysmal atrial fibrillation -Heart rate is controlled, continue anticoagulation with apixaban -Continue metoprolol   Bilateral lower extremity wounds -Present on admission -Wound care consulted  Procedures: EEG  Consultations: Neurology  Discharge Exam: Vitals:   07/29/21 0739 07/29/21 1213  BP: (!) 137/107 (!) 120/93  Pulse: 74 87  Resp: 18 18  Temp: 98.1 F (36.7 C) 98.4 F (36.9 C)  SpO2: 99% 98%    General: Appears in no acute distress Cardiovascular: S1-S2, regular Respiratory: Clear to auscultation bilaterally  Discharge Instructions   Discharge Instructions     Diet - low sodium heart healthy   Complete by: As directed    Discharge wound care:   Complete by: As directed    Apply single layer of xeroform over the right LE wounds (pretibial and achilles) and protect the left pretibial (healed wounds).  Top with dry dressing, secure with kerlix. Change  every other day   Increase activity slowly   Complete by: As directed       Allergies as of 07/29/2021       Reactions   Sodium Hypochlorite Anaphylaxis   (Bleach wipes) "I am not able to breathe"         Medication List     STOP taking these medications    Alogliptin Benzoate 25 MG Tabs   dextrose 5 % and 0.45% NaCl infusion   Lantus SoloStar 100 UNIT/ML Solostar Pen Generic drug: insulin glargine   nicotine 7 mg/24hr patch Commonly known as: NICODERM CQ - dosed in TI/14 hr   Trulicity 3 ER/1.5QM Sopn Generic drug: Dulaglutide       TAKE these medications    acetaminophen 325 MG tablet Commonly known as: TYLENOL Take 2 tablets (650 mg total) by mouth every 6 (six) hours as needed for headache, fever or mild pain. What changed: reasons to take this   amLODipine 10 MG tablet Commonly known as: NORVASC Take 1 tablet (10 mg total) by mouth daily.   aspirin 81 MG EC tablet Take 1 tablet (81 mg total) by mouth daily. Swallow whole.   benztropine 0.5 MG tablet Commonly known as: COGENTIN Take 1 tablet (0.5 mg total) by mouth 2 (two) times daily.   Eliquis 5 MG Tabs tablet Generic drug: apixaban Take 5 mg by mouth 2 (two) times daily.   feeding supplement Liqd Take 237 mLs by mouth 3 (three) times daily between meals.   FLUoxetine 20 MG capsule Commonly known as: PROZAC Take 1 capsule (20 mg total) by mouth daily.   HumaLOG KwikPen 100 UNIT/ML KwikPen Generic drug: insulin lispro Inject 1-10 Units into the skin See admin instructions. Inject 1-10 units into the skin before meals and at bedtime, PER SLIDING SCALE: BGL 151-200 = 1 unit; 201-250 = 2 units; 251-300 = 4 units; 301-350 = 6 units; 351-400 = 8 units; 401-450 = 10 units   levETIRAcetam 500 MG tablet Commonly known as: KEPPRA Take 1 tablet (500 mg total) by mouth 2 (two) times daily. What changed:  medication strength how much to take   metoprolol succinate 25 MG 24 hr tablet Commonly known as: TOPROL-XL Take 50 mg by mouth daily. What changed: Another medication with the same name was removed. Continue taking this medication, and follow the directions you see here.   multivitamin with minerals Tabs  tablet Take 1 tablet by mouth daily. Start taking on: July 30, 2021   omeprazole 20 MG capsule Commonly known as: PRILOSEC Take 20 mg by mouth daily before breakfast.   oxybutynin 5 MG 24 hr tablet Commonly known as: DITROPAN-XL Take 5 mg by mouth in the morning.   rosuvastatin 10 MG tablet Commonly known as: CRESTOR Take 10 mg by mouth every evening.   sennosides-docusate sodium 8.6-50 MG tablet Commonly known as: SENOKOT-S Take 1 tablet by mouth in the morning and at bedtime.   Spiriva HandiHaler 18 MCG inhalation capsule Generic drug: tiotropium Place 18 mcg into inhaler and inhale daily as needed (Shortness of breath).   thiamine 100 MG tablet Take 1 tablet (100 mg total) by mouth daily. Start taking on: July 30, 2021   zinc sulfate 220 (50 Zn) MG capsule Take 1 capsule (220 mg total) by mouth daily.               Discharge Care Instructions  (From admission, onward)           Start  Ordered   07/29/21 0000  Discharge wound care:       Comments: Apply single layer of xeroform over the right LE wounds (pretibial and achilles) and protect the left pretibial (healed wounds).  Top with dry dressing, secure with kerlix. Change every other day   07/29/21 1503           Allergies  Allergen Reactions   Sodium Hypochlorite Anaphylaxis    (Bleach wipes) "I am not able to breathe"      The results of significant diagnostics from this hospitalization (including imaging, microbiology, ancillary and laboratory) are listed below for reference.    Significant Diagnostic Studies: DG Lumbar Spine Complete  Result Date: 07/07/2021 CLINICAL DATA:  Status post fall EXAM: LUMBAR SPINE - COMPLETE 4+ VIEW COMPARISON:  X-ray lumbar spine 06/21/2017, CT abdomen pelvis 04/01/2021 FINDINGS: Five rib-bearing lumbar vertebral bodies. Multilevel degenerative changes of the spine. Redemonstration of a L1 anterior wedge compression fracture with at least 30% height  loss. No definite acute displaced lumbar spine fracture. Similar-appearing grade 1 anterolisthesis of L3 on L4. Markedly limited evaluation due to overlapping osseous structures and overlying soft tissues. Atherosclerotic plaque. IMPRESSION: 1. No acute displaced fracture or traumatic listhesis of the lumbar spine in a patient with a chronic L1 anterior wedge compression fracture and stable grade 1 anterolisthesis of L3 on L4. Please note limited evaluation due to overlapping osseous structures and overlying soft tissues. 2.  Aortic Atherosclerosis (ICD10-I70.0). Electronically Signed   By: Iven Finn M.D.   On: 07/07/2021 23:06   DG Pelvis 1-2 Views  Result Date: 07/07/2021 CLINICAL DATA:  Status post fall EXAM: PELVIS - 1-2 VIEW COMPARISON:  None. FINDINGS: There is no evidence of pelvic fracture or diastasis. Markedly limited evaluation of the pubic symphysis due to overlying stool ball. Grossly unremarkable sacrum and sacroiliac joints with limited evaluation due to overlying bowel. No pelvic bone lesions are seen. IMPRESSION: Negative with limited evaluation due to overlapping osseous structures and overlying soft tissues. Electronically Signed   By: Iven Finn M.D.   On: 07/07/2021 23:01   CT HEAD WO CONTRAST (5MM)  Result Date: 07/20/2021 CLINICAL DATA:  Delirium. EXAM: CT HEAD WITHOUT CONTRAST TECHNIQUE: Contiguous axial images were obtained from the base of the skull through the vertex without intravenous contrast. COMPARISON:  07/16/2021 FINDINGS: Brain: No evidence of acute infarction, hemorrhage, hydrocephalus, extra-axial collection or mass lesion/mass effect. Small hyperdense extra-axial nodule overlying the left frontal lobe is unchanged from previous imaging compatible with a meningioma, image 23/3. There is mild diffuse low-attenuation within the subcortical and periventricular white matter compatible with chronic microvascular disease. Remote left basal ganglia lacunar infarct.  Small bilateral cerebellar hemisphere lacunar infarcts are also noted. Vascular: No hyperdense vessel or unexpected calcification. Skull: Normal. Negative for fracture or focal lesion. Sinuses/Orbits: Paranasal sinuses and mastoid air cells are clear. Remote fracture involving the medial wall of the left orbit is again seen, image 71/4. Other: None IMPRESSION: 1. No acute intracranial abnormalities. 2. Chronic small vessel ischemic disease and brain atrophy. 3. Stable small left frontal meningioma. Electronically Signed   By: Kerby Moors M.D.   On: 07/20/2021 13:03   CT HEAD WO CONTRAST (5MM)  Result Date: 07/16/2021 CLINICAL DATA:  Headaches, history of fall on 07/07/2021 EXAM: CT HEAD WITHOUT CONTRAST TECHNIQUE: Contiguous axial images were obtained from the base of the skull through the vertex without intravenous contrast. COMPARISON:  07/07/2021 FINDINGS: Brain: No evidence of acute infarction, hemorrhage, hydrocephalus, extra-axial collection or  mass lesion/mass effect. Chronic atrophic and ischemic changes are noted stable from the prior exam. Vascular: No hyperdense vessel or unexpected calcification. Skull: Normal. Negative for fracture or focal lesion. Sinuses/Orbits: No acute finding. Other: None. IMPRESSION: Chronic atrophic and ischemic changes stable from the recent exam. No acute abnormality is noted. Electronically Signed   By: Inez Catalina M.D.   On: 07/16/2021 19:29   CT HEAD WO CONTRAST (5MM)  Result Date: 07/07/2021 CLINICAL DATA:  Status post fall.  Dementia.  Intoxicated. EXAM: CT HEAD WITHOUT CONTRAST CT CERVICAL SPINE WITHOUT CONTRAST TECHNIQUE: Multidetector CT imaging of the head and cervical spine was performed following the standard protocol without intravenous contrast. Multiplanar CT image reconstructions of the cervical spine were also generated. COMPARISON:  CT head and cervical spine 06/08/2021 FINDINGS: CT HEAD FINDINGS BRAIN: BRAIN Cerebral ventricle sizes are concordant  with the degree of cerebral volume loss. Patchy and confluent areas of decreased attenuation are noted throughout the deep and periventricular white matter of the cerebral hemispheres bilaterally, compatible with chronic microvascular ischemic disease. Chronic left cerebellar infarction. No evidence of large-territorial acute infarction. No parenchymal hemorrhage. No mass lesion. No extra-axial collection. No mass effect or midline shift. No hydrocephalus. Basilar cisterns are patent. Vascular: No hyperdense vessel. Skull: No acute fracture or focal lesion. Sinuses/Orbits: Paranasal sinuses and mastoid air cells are clear. The orbits are unremarkable. Other: None. CT CERVICAL SPINE FINDINGS Alignment: Normal. Skull base and vertebrae: No acute fracture. No aggressive appearing focal osseous lesion or focal pathologic process. Soft tissues and spinal canal: No prevertebral fluid or swelling. No visible canal hematoma. Upper chest: Patchy ground-glass airspace opacities may be due to atelectasis/mosaic attenuation versus infection/inflammation. Other: Bilateral shoulder degenerative changes. IMPRESSION: 1. No acute intracranial abnormality. 2. No acute displaced fracture or traumatic listhesis of the cervical spine. 3. Patchy ground-glass airspace opacities may be due to atelectasis/mosaic attenuation versus infection/inflammation. Recommend chest x-ray for further evaluation. Electronically Signed   By: Iven Finn M.D.   On: 07/07/2021 23:31   CT HEAD WO CONTRAST  Result Date: 07/01/2021 CLINICAL DATA:  Altered mental status. EXAM: CT HEAD WITHOUT CONTRAST TECHNIQUE: Contiguous axial images were obtained from the base of the skull through the vertex without intravenous contrast. COMPARISON:  CT head dated June 17, 2021. FINDINGS: Brain: No evidence of acute infarction, hemorrhage, hydrocephalus, extra-axial collection or mass lesion/mass effect. Stable atrophy and chronic microvascular ischemic changes. Old  infarcts in the left caudate head, left thalamus, and left cerebellum again noted. Vascular: Atherosclerotic vascular calcification of the carotid siphons. No hyperdense vessel. Skull: Normal. Negative for fracture or focal lesion. Sinuses/Orbits: No acute finding. Other: None. IMPRESSION: 1. No acute intracranial abnormality. 2. Stable atrophy, chronic microvascular ischemic changes, and old infarcts. Electronically Signed   By: Titus Dubin M.D.   On: 07/01/2021 12:25   CT Cervical Spine Wo Contrast  Result Date: 07/07/2021 CLINICAL DATA:  Status post fall.  Dementia.  Intoxicated. EXAM: CT HEAD WITHOUT CONTRAST CT CERVICAL SPINE WITHOUT CONTRAST TECHNIQUE: Multidetector CT imaging of the head and cervical spine was performed following the standard protocol without intravenous contrast. Multiplanar CT image reconstructions of the cervical spine were also generated. COMPARISON:  CT head and cervical spine 06/08/2021 FINDINGS: CT HEAD FINDINGS BRAIN: BRAIN Cerebral ventricle sizes are concordant with the degree of cerebral volume loss. Patchy and confluent areas of decreased attenuation are noted throughout the deep and periventricular white matter of the cerebral hemispheres bilaterally, compatible with chronic microvascular ischemic disease. Chronic left  cerebellar infarction. No evidence of large-territorial acute infarction. No parenchymal hemorrhage. No mass lesion. No extra-axial collection. No mass effect or midline shift. No hydrocephalus. Basilar cisterns are patent. Vascular: No hyperdense vessel. Skull: No acute fracture or focal lesion. Sinuses/Orbits: Paranasal sinuses and mastoid air cells are clear. The orbits are unremarkable. Other: None. CT CERVICAL SPINE FINDINGS Alignment: Normal. Skull base and vertebrae: No acute fracture. No aggressive appearing focal osseous lesion or focal pathologic process. Soft tissues and spinal canal: No prevertebral fluid or swelling. No visible canal hematoma.  Upper chest: Patchy ground-glass airspace opacities may be due to atelectasis/mosaic attenuation versus infection/inflammation. Other: Bilateral shoulder degenerative changes. IMPRESSION: 1. No acute intracranial abnormality. 2. No acute displaced fracture or traumatic listhesis of the cervical spine. 3. Patchy ground-glass airspace opacities may be due to atelectasis/mosaic attenuation versus infection/inflammation. Recommend chest x-ray for further evaluation. Electronically Signed   By: Iven Finn M.D.   On: 07/07/2021 23:31   DG Chest Portable 1 View  Result Date: 07/20/2021 CLINICAL DATA:  Respiratory distress. EXAM: PORTABLE CHEST 1 VIEW COMPARISON:  07/01/2021 FINDINGS: ETT tip is above the carina. There is a right IJ catheter with tip at the cavoatrial junction. NG tube tip is just below the GE junction with side port above the diaphragm. Recommend advancing tube by approximately 8 cm. Heart size appears within normal limits. There is asymmetric opacification within the right lung apex. This may in part reflect rotational artifact although right upper lobe atelectasis can not be excluded. Left lung appears clear. IMPRESSION: 1. Asymmetric opacification within the right lung apex may in part reflect rotational artifact, although right upper lobe atelectasis or airspace consolidation can not be excluded. 2. Satisfactory position of right IJ catheter and ET tube. 3. The NG tube is under advanced with side port above the GE junction. Recommend advancing tube by 8 cm. Electronically Signed   By: Kerby Moors M.D.   On: 07/20/2021 12:45   DG Chest Port 1 View  Result Date: 07/01/2021 CLINICAL DATA:  Altered mental status. EXAM: PORTABLE CHEST 1 VIEW COMPARISON:  Chest x-ray dated April 21, 2021. FINDINGS: The patient is rotated to the right. Stable cardiomegaly. Normal pulmonary vascularity. No focal consolidation, pleural effusion, or pneumothorax. No acute osseous abnormality. IMPRESSION: No active  disease. Electronically Signed   By: Titus Dubin M.D.   On: 07/01/2021 12:21   DG Abd Portable 1V  Result Date: 07/21/2021 CLINICAL DATA:  Feeding tube placement EXAM: PORTABLE ABDOMEN - 1 VIEW COMPARISON:  None. FINDINGS: Esophagogastric tube with tip and side port below the diaphragm, tip in the vicinity of the pylorus. The stomach is gas distended. No obvious free air. IMPRESSION: Esophagogastric tube with tip and side port below the diaphragm, tip in the vicinity of the pylorus. The stomach is gas distended. Electronically Signed   By: Eddie Candle M.D.   On: 07/21/2021 11:39   ECHOCARDIOGRAM COMPLETE  Result Date: 07/21/2021    ECHOCARDIOGRAM REPORT   Patient Name:   AARIAH GODETTE Date of Exam: 07/21/2021 Medical Rec #:  [id number]        Height:       61.0 in Accession #:    [id number]       Weight:       149.0 lb Date of Birth:  01/29/1966        BSA:          1.667 m Patient Age:    21 years  BP:           94/79 mmHg Patient Gender: F                HR:           86 bpm. Exam Location:  Inpatient Procedure: 2D Echo, Cardiac Doppler and Color Doppler Indications:    CHF  History:        Patient has prior history of Echocardiogram examinations, most                 recent 04/25/2021. CHF, Arrythmias:Atrial Fibrillation,                 Signs/Symptoms:Shock, Obesity; Risk Factors:Diabetes and                 Hypertension.  Sonographer:    Dustin Flock RDCS Referring Phys: [id number] GRACE E BOWSER  Sonographer Comments: Echo performed with patient supine and on artificial respirator. IMPRESSIONS  1. Left ventricular ejection fraction, by estimation, is 50 to 55%. The left ventricle has low normal function. The left ventricle has no regional wall motion abnormalities. There is mild left ventricular hypertrophy. Left ventricular diastolic parameters are consistent with Grade I diastolic dysfunction (impaired relaxation).  2. Right ventricular systolic function is normal. The right ventricular  size is normal. There is mildly elevated pulmonary artery systolic pressure.  3. Left atrial size was moderately dilated.  4. The mitral valve is grossly normal. No evidence of mitral valve regurgitation.  5. The aortic valve is tricuspid. Aortic valve regurgitation is not visualized.  6. The inferior vena cava is normal in size with greater than 50% respiratory variability, suggesting right atrial pressure of 3 mmHg. Comparison(s): Changes from prior study are noted. 04/25/2021: LVEF 60-65%. FINDINGS  Left Ventricle: Left ventricular ejection fraction, by estimation, is 50 to 55%. The left ventricle has low normal function. The left ventricle has no regional wall motion abnormalities. The left ventricular internal cavity size was normal in size. There is mild left ventricular hypertrophy. Left ventricular diastolic parameters are consistent with Grade I diastolic dysfunction (impaired relaxation). Indeterminate filling pressures. Right Ventricle: The right ventricular size is normal. No increase in right ventricular wall thickness. Right ventricular systolic function is normal. There is mildly elevated pulmonary artery systolic pressure. The tricuspid regurgitant velocity is 3.23  m/s, and with an assumed right atrial pressure of 3 mmHg, the estimated right ventricular systolic pressure is 45.3 mmHg. Left Atrium: Left atrial size was moderately dilated. Right Atrium: Right atrial size was normal in size. Pericardium: There is no evidence of pericardial effusion. Mitral Valve: The mitral valve is grossly normal. No evidence of mitral valve regurgitation. Tricuspid Valve: The tricuspid valve is grossly normal. Tricuspid valve regurgitation is trivial. Aortic Valve: The aortic valve is tricuspid. Aortic valve regurgitation is not visualized. Pulmonic Valve: The pulmonic valve was normal in structure. Pulmonic valve regurgitation is not visualized. Aorta: The aortic root and ascending aorta are structurally normal, with  no evidence of dilitation. Venous: The inferior vena cava is normal in size with greater than 50% respiratory variability, suggesting right atrial pressure of 3 mmHg. IAS/Shunts: No atrial level shunt detected by color flow Doppler.  LEFT VENTRICLE PLAX 2D LVIDd:         5.20 cm     Diastology LVIDs:         3.90 cm     LV e' medial:    5.00 cm/s LV PW:  1.20 cm     LV E/e' medial:  8.1 LV IVS:        1.20 cm     LV e' lateral:   5.55 cm/s LVOT diam:     2.10 cm     LV E/e' lateral: 7.3 LV SV:         54 LV SV Index:   32 LVOT Area:     3.46 cm  LV Volumes (MOD) LV vol d, MOD A4C: 67.3 ml LV vol s, MOD A4C: 32.4 ml LV SV MOD A4C:     67.3 ml RIGHT VENTRICLE RV Basal diam:  2.70 cm RV S prime:     12.70 cm/s TAPSE (M-mode): 1.9 cm LEFT ATRIUM             Index       RIGHT ATRIUM           Index LA diam:        4.60 cm 2.76 cm/m  RA Area:     13.80 cm LA Vol (A2C):   91.2 ml 54.72 ml/m RA Volume:   35.00 ml  21.00 ml/m LA Vol (A4C):   47.1 ml 28.26 ml/m LA Biplane Vol: 67.3 ml 40.38 ml/m  AORTIC VALVE LVOT Vmax:   92.40 cm/s LVOT Vmean:  66.000 cm/s LVOT VTI:    0.155 m  AORTA Ao Root diam: 3.30 cm MITRAL VALVE               TRICUSPID VALVE MV Area (PHT): 5.66 cm    TR Peak grad:   41.7 mmHg MV Decel Time: 134 msec    TR Vmax:        323.00 cm/s MV E velocity: 40.70 cm/s MV A velocity: 53.80 cm/s  SHUNTS MV E/A ratio:  0.76        Systemic VTI:  0.16 m                            Systemic Diam: 2.10 cm Lyman Bishop MD Electronically signed by Lyman Bishop MD Signature Date/Time: 07/21/2021/4:02:02 PM    Final    CT Maxillofacial Wo Contrast  Result Date: 07/16/2021 CLINICAL DATA:  Facial trauma, swelling. Fall 07/07/2021. Rule out mandibular zygomatic fracture. EXAM: CT MAXILLOFACIAL WITHOUT CONTRAST TECHNIQUE: Multidetector CT imaging of the maxillofacial structures was performed. Multiplanar CT image reconstructions were also generated. COMPARISON:  Head CT 07/07/2021 FINDINGS: Osseous: No acute  fracture of the nasal bone, zygomatic arches, or mandibles. The temporomandibular joints are congruent. Patient is edentulous. Orbits: Remote fracture of the left medial orbital wall with herniation of intraorbital fat. No acute orbital fracture. No globe injury. Sinuses: Trace mucosal thickening of the left maxillary sinus. No sinus fracture or fluid level. Mastoid air cells are clear. Soft tissues: Soft tissue edema and stranding involving the right cheek. Limited intracranial: No significant or unexpected finding. IMPRESSION: 1. No acute facial bone fracture. Particularly, no fracture of the right zygomatic arch or mandible. 2. Remote fracture of the left medial orbital wall with herniation of intraorbital fat. 3. Soft tissue edema and stranding involving the right cheek. Electronically Signed   By: Keith Rake M.D.   On: 07/16/2021 18:35    Microbiology: Recent Results (from the past 240 hour(s))  Resp Panel by RT-PCR (Flu A&B, Covid) Nasopharyngeal Swab     Status: Abnormal   Collection Time: 07/20/21 12:03 PM   Specimen: Nasopharyngeal Swab; Nasopharyngeal(NP) swabs in vial  transport medium  Result Value Ref Range Status   SARS Coronavirus 2 by RT PCR POSITIVE (A) NEGATIVE Final    Comment: RESULT CALLED TO, READ BACK BY AND VERIFIED WITH: RN R.HARDY ON [id number] AT [id number] BY E.PARRISH (NOTE) SARS-CoV-2 target nucleic acids are DETECTED.  The SARS-CoV-2 RNA is generally detectable in upper respiratory specimens during the acute phase of infection. Positive results are indicative of the presence of the identified virus, but do not rule out bacterial infection or co-infection with other pathogens not detected by the test. Clinical correlation with patient history and other diagnostic information is necessary to determine patient infection status. The expected result is Negative.  Fact Sheet for Patients: EntrepreneurPulse.com.au  Fact Sheet for Healthcare  Providers: IncredibleEmployment.be  This test is not yet approved or cleared by the [place] FDA and  has been authorized for detection and/or diagnosis of SARS-CoV-2 by FDA under an Emergency Use Authorization (EUA).  This EUA will remain in effect (meaning this t est can be used) for the duration of  the COVID-19 declaration under Section 564(b)(1) of the Act, 21 U.S.C. section 360bbb-3(b)(1), unless the authorization is terminated or revoked sooner.     Influenza A by PCR NEGATIVE NEGATIVE Final   Influenza B by PCR NEGATIVE NEGATIVE Final    Comment: (NOTE) The Xpert Xpress SARS-CoV-2/FLU/RSV plus assay is intended as an aid in the diagnosis of influenza from Nasopharyngeal swab specimens and should not be used as a sole basis for treatment. Nasal washings and aspirates are unacceptable for Xpert Xpress SARS-CoV-2/FLU/RSV testing.  Fact Sheet for Patients: EntrepreneurPulse.com.au  Fact Sheet for Healthcare Providers: IncredibleEmployment.be  This test is not yet approved or cleared by the [place] FDA and has been authorized for detection and/or diagnosis of SARS-CoV-2 by FDA under an Emergency Use Authorization (EUA). This EUA will remain in effect (meaning this test can be used) for the duration of the COVID-19 declaration under Section 564(b)(1) of the Act, 21 U.S.C. section 360bbb-3(b)(1), unless the authorization is terminated or revoked.  Performed at [facility], [place] [address], [place], [place] [address]   Blood culture (routine x 2)     Status: None   Collection Time: 07/20/21 12:28 PM   Specimen: BLOOD  Result Value Ref Range Status   Specimen Description BLOOD SITE NOT SPECIFIED  Final   Special Requests   Final    BOTTLES DRAWN AEROBIC AND ANAEROBIC Blood Culture adequate volume   Culture   Final    NO GROWTH 5 DAYS Performed at [facility], [address] [place], [place]  [address]    Report Status 07/25/2021 FINAL  Final  MRSA Next Gen by PCR, Nasal     Status: None   Collection Time: 07/20/21  6:49 PM   Specimen: Nasal Mucosa; Nasal Swab  Result Value Ref Range Status   MRSA by PCR Next Gen NOT DETECTED NOT DETECTED Final    Comment: (NOTE) The GeneXpert MRSA Assay (FDA approved for NASAL specimens only), is one component of a comprehensive MRSA colonization surveillance program. It is not intended to diagnose MRSA infection nor to guide or monitor treatment for MRSA infections. Test performance is not FDA approved in patients less than 23 years old. Performed at [facility], [place] [address] [place], [place] [address]   Urine Culture     Status: Abnormal   Collection Time: 07/23/21 10:17 AM   Specimen: Urine, Clean Catch  Result Value Ref Range  Status   Specimen Description URINE, CLEAN CATCH  Final   Special Requests   Final    NONE Performed at [facility], [place] [address], [place], [place] [address]    Culture (A)  Final    20,000 COLONIES/mL VANCOMYCIN RESISTANT ENTEROCOCCUS ISOLATED   Report Status 07/25/2021 FINAL  Final   Organism ID, Bacteria VANCOMYCIN RESISTANT ENTEROCOCCUS ISOLATED (A)  Final      Susceptibility   Vancomycin resistant enterococcus isolated - MIC*    AMPICILLIN >=32 RESISTANT Resistant     NITROFURANTOIN 128 RESISTANT Resistant     VANCOMYCIN >=32 RESISTANT Resistant     LINEZOLID 2 SENSITIVE Sensitive     * 20,000 COLONIES/mL VANCOMYCIN RESISTANT ENTEROCOCCUS ISOLATED     Labs: Basic Metabolic Panel: No results for input(s): NA, K, CL, CO2, GLUCOSE, BUN, CREATININE, CALCIUM, MG, PHOS in the last 168 hours. Liver Function Tests: No results for input(s): AST, ALT, ALKPHOS, BILITOT, PROT, ALBUMIN in the last 168 hours. No results for input(s): LIPASE, AMYLASE in the last 168 hours. No results for input(s): AMMONIA in the last 168 hours. CBC: Recent Labs  Lab 07/25/21 1411  WBC 6.9  HGB 8.5*  HCT  28.4*  MCV 93.1  PLT 331   Cardiac Enzymes: No results for input(s): CKTOTAL, CKMB, CKMBINDEX, TROPONINI in the last 168 hours. BNP: BNP (last 3 results) Recent Labs    01/16/21 1055 03/09/21 1735  BNP 352.0* 87.0    ProBNP (last 3 results) No results for input(s): PROBNP in the last 8760 hours.  CBG: Recent Labs  Lab 07/28/21 1937 07/28/21 2346 07/29/21 0359 07/29/21 0741 07/29/21 1221  GLUCAP 64* 135* 146* 120* 126*       Signed:  Oswald Hillock MD.  [facility] 07/29/2021, 3:04 PM

## 2021-07-29 NOTE — Progress Notes (Signed)
Attempt x3 to call report to [facility], no answer. Will pass along to upcoming nurse.

## 2021-07-30 ENCOUNTER — Ambulatory Visit: Payer: Medicare Other | Admitting: Adult Health

## 2021-07-30 LAB — GLUCOSE, CAPILLARY
Glucose-Capillary: 90 mg/dL (ref 70–99)
Glucose-Capillary: 173 mg/dL — ABNORMAL HIGH (ref 70–99)
Glucose-Capillary: 136 mg/dL — ABNORMAL HIGH (ref 70–99)

## 2021-07-30 NOTE — Progress Notes (Signed)
Nutrition Follow-up  DOCUMENTATION CODES:   Severe malnutrition in context of chronic illness  INTERVENTION:   -Continue Ensure Enlive po TID, each supplement provides 350 kcal and 20 grams of protein  -Continue MVI with minerals daily   NUTRITION DIAGNOSIS:   Severe Malnutrition related to chronic illness (CHF, COPD) as evidenced by severe muscle depletion, percent weight loss (30% weight loss in 5 months).  Ongoing  GOAL:   Patient will meet greater than or equal to 90% of their needs  Progressing   MONITOR:   PO intake, Supplement acceptance, Labs, Weight trends, Skin, I & O's  REASON FOR ASSESSMENT:   Ventilator, Consult Enteral/tube feeding initiation and management  ASSESSMENT:   55 year old female who presented to the ED from [place] on 9/25 with AMS, witnessed seizures. PMH of seizures, CHF, CKD stage III, COPD, T2DM, HTN, HLD, polysubstance abuse (tobacco, EtOH marijuana, cocaine) atrial fibrillation, PVD. Pt required intubation in the ED.  9/26 - extubated 9/27 - diet advanced to regular with thin liquids  Reviewed I/O's: -250 ml x 24 hours and -1.2 L since admission  UOP: 250 x 24 hours  Pt reports that her appetite is poor and she does not like the hospital food. Noted meal completion 10-80%. Per RN, pt has been consuming food brought in by family. Noted can of fruit punch on tray table; when RD asked where this came from, pt replied "I don't know and I won't be answering anymore of your questions".   Pt did confirm with RD that she was consuming Ensure supplements.   Per RN, plan to discharge to SNF today. Guardianship hearing was held and pt is now a ward of the state.   Medications reviewed and include thiamine.   Labs reviewed: CBGS: 90-195 (inpatient orders for glycemic control are 0-9 units insulin aspart every 4 hours).    Diet Order:   Diet Order             Diet - low sodium heart healthy           Diet regular Room service  appropriate? Yes with Assist; Fluid consistency: Thin  Diet effective now                   EDUCATION NEEDS:   Education needs have been addressed  Skin:  Skin Assessment: Skin Integrity Issues: Skin Integrity Issues:: Stage III, Other (Comment) Stage III: R heel, sacrum Other: non-pressure wound to RLE x 3, non-pressure wound to LLE x 3  Last BM:  07/28/21  Height:   Ht Readings from Last 1 Encounters:  07/25/21 5\' 1"  (1.549 m)    Weight:   Wt Readings from Last 1 Encounters:  07/28/21 63.5 kg    Ideal Body Weight:  47.7 kg  BMI:  Body mass index is 26.45 kg/m.  Estimated Nutritional Needs:   Kcal:  1800-2000  Protein:  90-110 grams  Fluid:  1.8 L/day    [name], RD, LDN, [place] Registered Dietitian II Certified Diabetes Care and Education Specialist Please refer to [facility] for RD and/or RD on-call/weekend/after hours pager

## 2021-07-30 NOTE — Progress Notes (Signed)
    Patient d/c'd to SNF.  No overnight events JV

## 2021-07-30 NOTE — TOC Progression Note (Addendum)
Transition of Care [facility]) - Progression Note    Patient Details  Name: ALANE HANSSEN MRN: [id number] Date of Birth: Nov 05, 1965  Transition of Care [facility]) CM/SW [place], LCSW Phone Number: 07/30/2021, 8:36 AM  Clinical Narrative:    8:36am-CSW notified that patient refused transport last night because she wants to go to her son's house. Per her legal Guardian, [place] [facility], she is not allowed to return there. CSW left voicemail for [place] with APS to determine next steps. CSW also notified TOC leadership.   11am-Per Carrie w/APS, patient is not allowed to refuse and she is absolutely not going to son's home. She has spoken with patient and her son. She instructed CSW to call for transport.    Expected Discharge Plan: Long Term Nursing Home Barriers to Discharge: No Barriers Identified  Expected Discharge Plan and Services Expected Discharge Plan: Long Term Nursing Home In-house Referral: Clinical Social Work   Post Acute Care Choice: [place] Living arrangements for the past 2 months: [place] Expected Discharge Date: 07/29/21                                     Social Determinants of Health (SDOH) Interventions    Readmission Risk Interventions Readmission Risk Prevention Plan 07/25/2021 10/31/2020 08/27/2020  Transportation Screening Complete Complete Complete  Medication Review Press photographer) Complete Complete Complete  PCP or Specialist appointment within 3-5 days of discharge Complete - Complete  HRI or Home Care Consult Complete - Complete  SW Recovery Care/Counseling Consult Complete Complete Complete  Palliative Care Screening Not Applicable Not Applicable Not Applicable  Skilled Nursing Facility Complete Complete Complete  Some recent data might be hidden

## 2021-07-30 NOTE — TOC Progression Note (Addendum)
Transition of Care [facility]) - Progression Note    Patient Details  Name: [name] MRN: [id number] Date of Birth: Jun 11, 1966  Transition of Care [facility]) CM/SW [place], LCSW Phone Number: 07/30/2021, 2:57 PM  Clinical Narrative:    CSW received notice that patient's son has filed an appeal with Medicare. CSW spoke with Morey Hummingbird at [place] who is not certain if patient's son has the right to appeal.   CSW consulted with [facility] Supervisor who contacted legal department. Per legal and Rosalyn Gess, patient's son is allowed to appeal and if patient does not want to discharge yet she has the right to wait on the appeal decision. Patient's sister at bedside and reports understanding Junious Dresser [phone]). RNCM assisting with Rosalyn Gess appeal paperwork. APS notified and has decided to ban patient's son, [name], from coming to the hospital due to interfering with care. CSW notified nursing director who notified security. [facility] aware.    Expected Discharge Plan: Long Term Nursing Home Barriers to Discharge: No Barriers Identified  Expected Discharge Plan and Services Expected Discharge Plan: Long Term Nursing Home In-house Referral: Clinical Social Work   Post Acute Care Choice: [place] Living arrangements for the past 2 months: [place] Expected Discharge Date: 07/29/21                                     Social Determinants of Health (SDOH) Interventions    Readmission Risk Interventions Readmission Risk Prevention Plan 07/25/2021 10/31/2020 08/27/2020  Transportation Screening Complete Complete Complete  Medication Review Press photographer) Complete Complete Complete  PCP or Specialist appointment within 3-5 days of discharge Complete - Complete  HRI or Home Care Consult Complete - Complete  SW Recovery Care/Counseling Consult Complete Complete Complete  Palliative Care Screening Not Applicable Not Applicable Not Applicable  Skilled  Nursing Facility Complete Complete Complete  Some recent data might be hidden

## 2021-07-30 NOTE — TOC Transition Note (Signed)
Transition of Care [facility]) - CM/SW Discharge Note   Patient Details  Name: [name] MRN: [id number] Date of Birth: Sep 26, 1966  Transition of [facility]) CM/SW Contact:  Benard Halsted, LCSW Phone Number: 07/30/2021, 11:53 AM   Clinical Narrative:    Patient will DC to: [facility] Anticipated DC date: 07/30/21 Family notified: [place] DSS (Guardian) and son Transport by: Corey Harold     Per MD patient ready for DC to [facility]. RN to call report prior to discharge [phone]). RN, patient, patient's family, and facility notified of DC. Discharge Summary and FL2 sent to facility. DC packet on chart. Ambulance transport requested for patient.    CSW will sign off for now as social work intervention is no longer needed. Please consult [name] again if new needs arise.   Final next level of care: Long Term Nursing Home Barriers to Discharge: No Barriers Identified   Patient Goals and CMS Choice Patient states their goals for this hospitalization and ongoing recovery are:: Return to SNF CMS Medicare.gov Compare Post Acute Care list provided to:: Patient Represenative (must comment) Choice offered to / list presented to : Adult Children  Discharge Placement   Existing PASRR number confirmed : 07/29/21            Patient to be transferred to facility by: Rosine Name of family member notified: [place] DSS and son Patient and family notified of of transfer: 07/29/21  Discharge Plan and Services In-house Referral: Clinical Social Work   Post Acute Care Choice: [place]                               Social Determinants of Health (SDOH) Interventions     Readmission Risk Interventions Readmission Risk Prevention Plan 07/25/2021 10/31/2020 08/27/2020  Transportation Screening Complete Complete Complete  Medication Review Press photographer) Complete Complete Complete  PCP or Specialist appointment within 3-5 days of discharge Complete - Complete   HRI or Home Care Consult Complete - Complete  SW Recovery Care/Counseling Consult Complete Complete Complete  Palliative Care Screening Not Applicable Not Applicable Not Applicable  Skilled Nursing Facility Complete Complete Complete  Some recent data might be hidden

## 2021-07-30 NOTE — Progress Notes (Signed)
Patient refusing dressing changes and turns at this time

## 2021-07-30 NOTE — TOC Progression Note (Addendum)
Transition of Care [facility]) - Progression Note    Patient Details  Name: [name] MRN: [id number] Date of Birth: [birth year]  Transition of Care [facility]) CM/SW Contact  Carles Collet, RN Phone Number: 07/30/2021, 2:56 PM  Clinical Narrative:    Damaris Schooner w supervisor Danelle Earthly LCSW re appeal. Questioned with whom to review the DND, IM, HINN 12. Per Evelena Peat we are to contact APS/ Guardian. LVM w Arnoldo Hooker, APS [facility], requesting urgent call back.  DND, IM, and HINN 12 are needed prior to being able to electronically submit clinicals to [facility].  Case ID [id number]  Received call back from [place], she wished to speak with her supervisor at this time to determine if APS can cancel the appeal with [place]. Morey Hummingbird states she will call me back to let me know, and at that time we will go over DND, IM, and HINN  1624 Spoke w Nira Conn [phone], supervisor APS [place], who states they will rescind the [facility] appeal immediately. Notified attending and TOC supervisor.      Expected Discharge Plan: Long Term Nursing Home Barriers to Discharge: No Barriers Identified  Expected Discharge Plan and Services Expected Discharge Plan: Long Term Nursing Home In-house Referral: Clinical Social Work   Post Acute Care Choice: [place] Living arrangements for the past 2 months: [place] Expected Discharge Date: 07/29/21                                     Social Determinants of Health (SDOH) Interventions    Readmission Risk Interventions Readmission Risk Prevention Plan 07/25/2021 10/31/2020 08/27/2020  Transportation Screening Complete Complete Complete  Medication Review Press photographer) Complete Complete Complete  PCP or Specialist appointment within 3-5 days of discharge Complete - Complete  HRI or Home Care Consult Complete - Complete  SW Recovery Care/Counseling Consult Complete Complete Complete  Palliative Care Screening Not  Applicable Not Applicable Not Applicable  Skilled Nursing Facility Complete Complete Complete  Some recent data might be hidden

## 2021-07-30 NOTE — Progress Notes (Signed)
Patient ready for discharge and transport arrived. PIV removed and tele discontinued. Social worker heard back that her appeal for discharge would be processed and to cancel transport. Provider notified. No PIV or tele needed per hospitalist.

## 2021-07-31 LAB — GLUCOSE, CAPILLARY
Glucose-Capillary: 118 mg/dL — ABNORMAL HIGH (ref 70–99)
Glucose-Capillary: 137 mg/dL — ABNORMAL HIGH (ref 70–99)
Glucose-Capillary: 146 mg/dL — ABNORMAL HIGH (ref 70–99)
Glucose-Capillary: 201 mg/dL — ABNORMAL HIGH (ref 70–99)
Glucose-Capillary: 210 mg/dL — ABNORMAL HIGH (ref 70–99)
Glucose-Capillary: 75 mg/dL (ref 70–99)

## 2021-07-31 MED ORDER — WHITE PETROLATUM EX OINT
TOPICAL_OINTMENT | CUTANEOUS | Status: AC
  Administered 2021-07-31: 1
  Filled 2021-07-31: qty 28.35

## 2021-07-31 NOTE — TOC Progression Note (Addendum)
Transition of Care [facility]) - Progression Note    Patient Details  Name: SHUNTEL FISHBURN MRN: [id number] Date of Birth: July 02, 1966  Transition of Care [facility]) CM/SW [place], LCSW Phone Number: 07/31/2021, 9:32 AM  Clinical Narrative:    9:32am-CSW received notification that Rosalyn Gess is not able to rescind appeal via APS and that son would have to do it, which he is not. CSW left voicemail for APS [place] D. And Arnoldo Hooker. to go over Regulatory documents with them to continue appeal, which is due by 12pm.    11:30am-CSW received call from [place]. With APS. She stated that paperwork would need to be done with son since APS is only Guardian over hear person not her finances yet. CSW contacted patient's son [name] and read Medicare appeal guideline information to him. TOC CM Assistance faxing paperwork to [place].    Expected Discharge Plan: Long Term Nursing Home Barriers to Discharge: No Barriers Identified  Expected Discharge Plan and Services Expected Discharge Plan: Long Term Nursing Home In-house Referral: Clinical Social Work   Post Acute Care Choice: [place] Living arrangements for the past 2 months: [place] Expected Discharge Date: 07/29/21                                     Social Determinants of Health (SDOH) Interventions    Readmission Risk Interventions Readmission Risk Prevention Plan 07/25/2021 10/31/2020 08/27/2020  Transportation Screening Complete Complete Complete  Medication Review Press photographer) Complete Complete Complete  PCP or Specialist appointment within 3-5 days of discharge Complete - Complete  HRI or Home Care Consult Complete - Complete  SW Recovery Care/Counseling Consult Complete Complete Complete  Palliative Care Screening Not Applicable Not Applicable Not Applicable  Skilled Nursing Facility Complete Complete Complete  Some recent data might be hidden

## 2021-07-31 NOTE — Progress Notes (Signed)
Await return to SNF-- apparently son is appealing her d/c back to SNF. NO overnight events JV

## 2021-08-01 LAB — GLUCOSE, CAPILLARY
Glucose-Capillary: 102 mg/dL — ABNORMAL HIGH (ref 70–99)
Glucose-Capillary: 119 mg/dL — ABNORMAL HIGH (ref 70–99)
Glucose-Capillary: 145 mg/dL — ABNORMAL HIGH (ref 70–99)

## 2021-08-01 NOTE — TOC Progression Note (Signed)
Transition of Care [facility]) - Progression Note    Patient Details  Name: [name] MRN: [id number] Date of Birth: [birth year]  Transition of Care [facility]) CM/SW [place], [place] Phone Number: 08/01/2021, 3:21 PM  Clinical Narrative:    CSW was notified that pt's appeal had been upheld. MD notified a new DC summary and order would have to be put in. MD spoke with son who may file another appeal. CSW spoke with facility and they can take back at any time. CSW called DSS to confirm discharge, waiting for a return call.   DSS and facility on board to accept pt tomorrow morning. Call [place] before DC. Does not need covid. TOC will continue to follow.   Expected Discharge Plan: Long Term Nursing Home Barriers to Discharge: No Barriers Identified  Expected Discharge Plan and Services Expected Discharge Plan: Long Term Nursing Home In-house Referral: Clinical Social Work   Post Acute Care Choice: [place] Living arrangements for the past 2 months: [place] Expected Discharge Date: 08/01/21                                     Social Determinants of Health (SDOH) Interventions    Readmission Risk Interventions Readmission Risk Prevention Plan 07/25/2021 10/31/2020 08/27/2020  Transportation Screening Complete Complete Complete  Medication Review Press photographer) Complete Complete Complete  PCP or Specialist appointment within 3-5 days of discharge Complete - Complete  HRI or Home Care Consult Complete - Complete  SW Recovery Care/Counseling Consult Complete Complete Complete  Palliative Care Screening Not Applicable Not Applicable Not Applicable  Skilled Nursing Facility Complete Complete Complete  Some recent data might be hidden

## 2021-08-01 NOTE — Progress Notes (Signed)
   Continue to await return to SNF.  No overnight events.   Eulogio Bear DO

## 2021-08-01 NOTE — Progress Notes (Signed)
Patient refused all night time medications. MD will be notified.

## 2021-08-01 NOTE — Discharge Summary (Addendum)
D/c Summary from Dr. Darrick Meigs on 10/4-- d/c was held due to appeal from son that was approved as clinicals were not sent in time.  No changes to her medications/plan of care have been made since 10/4 when initial d/c order placed  Physician Discharge Summary  [name] VHQ:[id number] DOB: Jul 29, 1966 DOA: 07/20/2021  PCP: Jodi Marble, MD  Admit date: 07/20/2021 Discharge date: 08/02/2021  Time spent: 60 minutes  Recommendations for Outpatient Follow-up:  Follow-up neurology as outpatient for keppra taper Palliative care to follow at facility Follow-up PCP  Stop Trulicity, Lantus, oral hypoglycemic agent as patient has poor p.o. intake and at risk for hypoglycemia-- follow blood sugars QID Apply single layer of xeroform over the right LE wounds (pretibial and achilles) and protect the left pretibial (healed wounds).  Top with dry dressing, secure with kerlix. Change every other day Cbc 1 week/BMP   Discharge Condition: Stable  Diet recommendation: regular  Filed Weights   07/28/21 0500 07/31/21 0500 08/01/21 0500  Weight: 63.5 kg 59.3 kg 58.8 kg    History of present illness:  55 year old female with past medical history of CHF with recovered EF, CKD stage III, diabetes mellitus type 2, hypertension, atrial fibrillation, peripheral vascular disease, seizure presented to the ED on 9/25 from nursing facility where she was found unresponsive.  She was brought to the hospital from facility and in route she had a generalized tonic-clonic seizure.  She was given Versed 5 mg IV after which she required bag mask ventilation.  In the ED she was found to have inadequate respiratory effort so she was intubated.  Neurology was consulted.  CT head showed stable small left frontal meningioma and chronic small vessel disease.  She was extubated on 07/21/2021.    Hospital Course:   Acute metabolic encephalopathy/seizure -Improved -Stat EEG obtained showed no epileptiform  discharges -Neurology feels that this was a provoked seizure due to hypoglycemia -Currently on Keppra 500 mg p.o. twice daily -Neurology recommends to taper off Keppra as outpatient -Follow-up neurology as outpatient for taper   UTI -Patient was found to have abnormal UA, was started on ceftriaxone empirically -Urine culture growing Enterococcus faecium -Urine culture is growing VRE 20,000 colonies per mL -Called and discussed with ID Dr. Linus Salmons, he says no need to treat as it is likely colonization -Patient is afebrile and denies dysuria.  Will not treat at this time.   -Antibiotics were discontinued   Diabetes mellitus type 2 -Continue sliding scale insulin with NovoLog -Stop Trulicity, Lantus, alogliptin as patient has poor p.o. intake -avoid hypoglycemia   Malnutrition/failure to thrive -Recurrent episodes of hypoglycemia -Patient has poor p.o. intake -Palliative care was consulted for goals of care and patient has decided not to pursue with feeding tube -She wants to be full code- continue to have palliative care follow at facility   Anemia -Stool for occult blood is negative -She had EGD in January of this year which showed esophagitis -Continue PPI daily -outpatient follow up    Paroxysmal atrial fibrillation -Heart rate is controlled, continue anticoagulation with apixaban -Continue metoprolol   wounds -Present on admission -Wound care as above Pressure Injury 07/20/21 Heel Distal;Posterior;Left Unstageable - Full thickness tissue loss in which the base of the injury is covered by slough (yellow, tan, gray, green or brown) and/or eschar (tan, brown or black) in the wound bed. arterial dx, uncle (Active)  07/20/21 1640  Location: Heel  Location Orientation: Distal;Posterior;Left  Staging: Unstageable - Full thickness tissue loss in  which the base of the injury is covered by slough (yellow, tan, gray, green or brown) and/or eschar (tan, brown or black) in the wound bed.   Wound Description (Comments): arterial dx, unclear etilogy  Present on Admission: Yes     Pressure Injury 07/23/21 Pretibial Distal;Left Unstageable - Full thickness tissue loss in which the base of the injury is covered by slough (yellow, tan, gray, green or brown) and/or eschar (tan, brown or black) in the wound bed. (Active)  07/23/21 1900  Location: Pretibial  Location Orientation: Distal;Left  Staging: Unstageable - Full thickness tissue loss in which the base of the injury is covered by slough (yellow, tan, gray, green or brown) and/or eschar (tan, brown or black) in the wound bed.  Wound Description (Comments):   Present on Admission: Yes     Pressure Injury 07/23/21 Sacrum Right;Left Stage 2 -  Partial thickness loss of dermis presenting as a shallow open injury with a red, pink wound bed without slough. 4 scattered small areas pm sacrum and buttocks area (Active)  07/23/21 2148  Location: Sacrum  Location Orientation: Right;Left  Staging: Stage 2 -  Partial thickness loss of dermis presenting as a shallow open injury with a red, pink wound bed without slough.  Wound Description (Comments): 4 scattered small areas pm sacrum and buttocks area  Present on Admission:         Procedures: EEG  Consultations: Neurology  Discharge Exam: Vitals:   08/01/21 0735 08/01/21 1100  BP: (!) 131/94 (!) 121/102  Pulse: 85 88  Resp: 16 17  Temp: 98.2 F (36.8 C) 98.4 F (36.9 C)  SpO2: 100% 99%     Discharge Instructions   Discharge Instructions     Diet - low sodium heart healthy   Complete by: As directed    Discharge wound care:   Complete by: As directed    Apply single layer of xeroform over the right LE wounds (pretibial and achilles) and protect the left pretibial (healed wounds).  Top with dry dressing, secure with kerlix. Change every other day   Increase activity slowly   Complete by: As directed       Allergies as of 08/01/2021       Reactions   Sodium  Hypochlorite Anaphylaxis   (Bleach wipes) "I am not able to breathe"        Medication List     STOP taking these medications    Alogliptin Benzoate 25 MG Tabs   dextrose 5 % and 0.45% NaCl infusion   Lantus SoloStar 100 UNIT/ML Solostar Pen Generic drug: insulin glargine   nicotine 7 mg/24hr patch Commonly known as: NICODERM CQ - dosed in AS/34 hr   Trulicity 3 HD/6.2IW Sopn Generic drug: Dulaglutide       TAKE these medications    acetaminophen 325 MG tablet Commonly known as: TYLENOL Take 2 tablets (650 mg total) by mouth every 6 (six) hours as needed for headache, fever or mild pain. What changed: reasons to take this   amLODipine 10 MG tablet Commonly known as: NORVASC Take 1 tablet (10 mg total) by mouth daily.   aspirin 81 MG EC tablet Take 1 tablet (81 mg total) by mouth daily. Swallow whole.   benztropine 0.5 MG tablet Commonly known as: COGENTIN Take 1 tablet (0.5 mg total) by mouth 2 (two) times daily.   Eliquis 5 MG Tabs tablet Generic drug: apixaban Take 5 mg by mouth 2 (two) times daily.   feeding supplement Liqd  Take 237 mLs by mouth 3 (three) times daily between meals.   FLUoxetine 20 MG capsule Commonly known as: PROZAC Take 1 capsule (20 mg total) by mouth daily.   HumaLOG KwikPen 100 UNIT/ML KwikPen Generic drug: insulin lispro Inject 1-10 Units into the skin See admin instructions. Inject 1-10 units into the skin before meals and at bedtime, PER SLIDING SCALE: BGL 151-200 = 1 unit; 201-250 = 2 units; 251-300 = 4 units; 301-350 = 6 units; 351-400 = 8 units; 401-450 = 10 units   levETIRAcetam 500 MG tablet Commonly known as: KEPPRA Take 1 tablet (500 mg total) by mouth 2 (two) times daily. What changed:  medication strength how much to take   metoprolol succinate 25 MG 24 hr tablet Commonly known as: TOPROL-XL Take 50 mg by mouth daily. What changed: Another medication with the same name was removed. Continue taking this  medication, and follow the directions you see here.   multivitamin with minerals Tabs tablet Take 1 tablet by mouth daily.   omeprazole 20 MG capsule Commonly known as: PRILOSEC Take 20 mg by mouth daily before breakfast.   oxybutynin 5 MG 24 hr tablet Commonly known as: DITROPAN-XL Take 5 mg by mouth in the morning.   rosuvastatin 10 MG tablet Commonly known as: CRESTOR Take 10 mg by mouth every evening.   sennosides-docusate sodium 8.6-50 MG tablet Commonly known as: SENOKOT-S Take 1 tablet by mouth in the morning and at bedtime.   Spiriva HandiHaler 18 MCG inhalation capsule Generic drug: tiotropium Place 18 mcg into inhaler and inhale daily as needed (Shortness of breath).   thiamine 100 MG tablet Take 1 tablet (100 mg total) by mouth daily.   zinc sulfate 220 (50 Zn) MG capsule Take 1 capsule (220 mg total) by mouth daily.               Discharge Care Instructions  (From admission, onward)           Start     Ordered   07/29/21 0000  Discharge wound care:       Comments: Apply single layer of xeroform over the right LE wounds (pretibial and achilles) and protect the left pretibial (healed wounds).  Top with dry dressing, secure with kerlix. Change every other day   07/29/21 1503           Allergies  Allergen Reactions   Sodium Hypochlorite Anaphylaxis    (Bleach wipes) "I am not able to breathe"    Contact information for follow-up providers     Jodi Marble, MD Follow up in 2 week(s).   Specialty: Internal Medicine Contact information: [place] [address] [phone]         Fay Records, MD .   Specialty: Cardiology Contact information: [address] [place] [address] [phone]              Contact information for after-discharge care     Destination     [facility] .   Service: Skilled Nursing Contact information: [address] [place] [phone]                      The results of significant diagnostics from this hospitalization (including imaging, microbiology, ancillary and laboratory) are listed below for reference.    Significant Diagnostic Studies: DG Lumbar Spine Complete  Result Date: 07/07/2021 CLINICAL DATA:  Status post fall EXAM:  LUMBAR SPINE - COMPLETE 4+ VIEW COMPARISON:  X-ray lumbar spine 06/21/2017, CT abdomen pelvis 04/01/2021 FINDINGS: Five rib-bearing lumbar vertebral bodies. Multilevel degenerative changes of the spine. Redemonstration of a L1 anterior wedge compression fracture with at least 30% height loss. No definite acute displaced lumbar spine fracture. Similar-appearing grade 1 anterolisthesis of L3 on L4. Markedly limited evaluation due to overlapping osseous structures and overlying soft tissues. Atherosclerotic plaque. IMPRESSION: 1. No acute displaced fracture or traumatic listhesis of the lumbar spine in a patient with a chronic L1 anterior wedge compression fracture and stable grade 1 anterolisthesis of L3 on L4. Please note limited evaluation due to overlapping osseous structures and overlying soft tissues. 2.  Aortic Atherosclerosis (ICD10-I70.0). Electronically Signed   By: Iven Finn M.D.   On: 07/07/2021 23:06   DG Pelvis 1-2 Views  Result Date: 07/07/2021 CLINICAL DATA:  Status post fall EXAM: PELVIS - 1-2 VIEW COMPARISON:  None. FINDINGS: There is no evidence of pelvic fracture or diastasis. Markedly limited evaluation of the pubic symphysis due to overlying stool ball. Grossly unremarkable sacrum and sacroiliac joints with limited evaluation due to overlying bowel. No pelvic bone lesions are seen. IMPRESSION: Negative with limited evaluation due to overlapping osseous structures and overlying soft tissues. Electronically Signed   By: Iven Finn M.D.   On: 07/07/2021 23:01   CT HEAD WO CONTRAST (5MM)  Result Date: 07/20/2021 CLINICAL DATA:  Delirium.  EXAM: CT HEAD WITHOUT CONTRAST TECHNIQUE: Contiguous axial images were obtained from the base of the skull through the vertex without intravenous contrast. COMPARISON:  07/16/2021 FINDINGS: Brain: No evidence of acute infarction, hemorrhage, hydrocephalus, extra-axial collection or mass lesion/mass effect. Small hyperdense extra-axial nodule overlying the left frontal lobe is unchanged from previous imaging compatible with a meningioma, image 23/3. There is mild diffuse low-attenuation within the subcortical and periventricular white matter compatible with chronic microvascular disease. Remote left basal ganglia lacunar infarct. Small bilateral cerebellar hemisphere lacunar infarcts are also noted. Vascular: No hyperdense vessel or unexpected calcification. Skull: Normal. Negative for fracture or focal lesion. Sinuses/Orbits: Paranasal sinuses and mastoid air cells are clear. Remote fracture involving the medial wall of the left orbit is again seen, image 71/4. Other: None IMPRESSION: 1. No acute intracranial abnormalities. 2. Chronic small vessel ischemic disease and brain atrophy. 3. Stable small left frontal meningioma. Electronically Signed   By: Kerby Moors M.D.   On: 07/20/2021 13:03   CT HEAD WO CONTRAST (5MM)  Result Date: 07/16/2021 CLINICAL DATA:  Headaches, history of fall on 07/07/2021 EXAM: CT HEAD WITHOUT CONTRAST TECHNIQUE: Contiguous axial images were obtained from the base of the skull through the vertex without intravenous contrast. COMPARISON:  07/07/2021 FINDINGS: Brain: No evidence of acute infarction, hemorrhage, hydrocephalus, extra-axial collection or mass lesion/mass effect. Chronic atrophic and ischemic changes are noted stable from the prior exam. Vascular: No hyperdense vessel or unexpected calcification. Skull: Normal. Negative for fracture or focal lesion. Sinuses/Orbits: No acute finding. Other: None. IMPRESSION: Chronic atrophic and ischemic changes stable from the recent exam.  No acute abnormality is noted. Electronically Signed   By: Inez Catalina M.D.   On: 07/16/2021 19:29   CT HEAD WO CONTRAST (5MM)  Result Date: 07/07/2021 CLINICAL DATA:  Status post fall.  Dementia.  Intoxicated. EXAM: CT HEAD WITHOUT CONTRAST CT CERVICAL SPINE WITHOUT CONTRAST TECHNIQUE: Multidetector CT imaging of the head and cervical spine was performed following the standard protocol without intravenous contrast. Multiplanar CT image reconstructions of the cervical spine were also generated.  COMPARISON:  CT head and cervical spine 06/08/2021 FINDINGS: CT HEAD FINDINGS BRAIN: BRAIN Cerebral ventricle sizes are concordant with the degree of cerebral volume loss. Patchy and confluent areas of decreased attenuation are noted throughout the deep and periventricular white matter of the cerebral hemispheres bilaterally, compatible with chronic microvascular ischemic disease. Chronic left cerebellar infarction. No evidence of large-territorial acute infarction. No parenchymal hemorrhage. No mass lesion. No extra-axial collection. No mass effect or midline shift. No hydrocephalus. Basilar cisterns are patent. Vascular: No hyperdense vessel. Skull: No acute fracture or focal lesion. Sinuses/Orbits: Paranasal sinuses and mastoid air cells are clear. The orbits are unremarkable. Other: None. CT CERVICAL SPINE FINDINGS Alignment: Normal. Skull base and vertebrae: No acute fracture. No aggressive appearing focal osseous lesion or focal pathologic process. Soft tissues and spinal canal: No prevertebral fluid or swelling. No visible canal hematoma. Upper chest: Patchy ground-glass airspace opacities may be due to atelectasis/mosaic attenuation versus infection/inflammation. Other: Bilateral shoulder degenerative changes. IMPRESSION: 1. No acute intracranial abnormality. 2. No acute displaced fracture or traumatic listhesis of the cervical spine. 3. Patchy ground-glass airspace opacities may be due to atelectasis/mosaic  attenuation versus infection/inflammation. Recommend chest x-ray for further evaluation. Electronically Signed   By: Iven Finn M.D.   On: 07/07/2021 23:31   CT Cervical Spine Wo Contrast  Result Date: 07/07/2021 CLINICAL DATA:  Status post fall.  Dementia.  Intoxicated. EXAM: CT HEAD WITHOUT CONTRAST CT CERVICAL SPINE WITHOUT CONTRAST TECHNIQUE: Multidetector CT imaging of the head and cervical spine was performed following the standard protocol without intravenous contrast. Multiplanar CT image reconstructions of the cervical spine were also generated. COMPARISON:  CT head and cervical spine 06/08/2021 FINDINGS: CT HEAD FINDINGS BRAIN: BRAIN Cerebral ventricle sizes are concordant with the degree of cerebral volume loss. Patchy and confluent areas of decreased attenuation are noted throughout the deep and periventricular white matter of the cerebral hemispheres bilaterally, compatible with chronic microvascular ischemic disease. Chronic left cerebellar infarction. No evidence of large-territorial acute infarction. No parenchymal hemorrhage. No mass lesion. No extra-axial collection. No mass effect or midline shift. No hydrocephalus. Basilar cisterns are patent. Vascular: No hyperdense vessel. Skull: No acute fracture or focal lesion. Sinuses/Orbits: Paranasal sinuses and mastoid air cells are clear. The orbits are unremarkable. Other: None. CT CERVICAL SPINE FINDINGS Alignment: Normal. Skull base and vertebrae: No acute fracture. No aggressive appearing focal osseous lesion or focal pathologic process. Soft tissues and spinal canal: No prevertebral fluid or swelling. No visible canal hematoma. Upper chest: Patchy ground-glass airspace opacities may be due to atelectasis/mosaic attenuation versus infection/inflammation. Other: Bilateral shoulder degenerative changes. IMPRESSION: 1. No acute intracranial abnormality. 2. No acute displaced fracture or traumatic listhesis of the cervical spine. 3. Patchy  ground-glass airspace opacities may be due to atelectasis/mosaic attenuation versus infection/inflammation. Recommend chest x-ray for further evaluation. Electronically Signed   By: Iven Finn M.D.   On: 07/07/2021 23:31   DG Chest Portable 1 View  Result Date: 07/20/2021 CLINICAL DATA:  Respiratory distress. EXAM: PORTABLE CHEST 1 VIEW COMPARISON:  07/01/2021 FINDINGS: ETT tip is above the carina. There is a right IJ catheter with tip at the cavoatrial junction. NG tube tip is just below the GE junction with side port above the diaphragm. Recommend advancing tube by approximately 8 cm. Heart size appears within normal limits. There is asymmetric opacification within the right lung apex. This may in part reflect rotational artifact although right upper lobe atelectasis can not be excluded. Left lung appears clear. IMPRESSION: 1. Asymmetric  opacification within the right lung apex may in part reflect rotational artifact, although right upper lobe atelectasis or airspace consolidation can not be excluded. 2. Satisfactory position of right IJ catheter and ET tube. 3. The NG tube is under advanced with side port above the GE junction. Recommend advancing tube by 8 cm. Electronically Signed   By: Kerby Moors M.D.   On: 07/20/2021 12:45   DG Abd Portable 1V  Result Date: 07/21/2021 CLINICAL DATA:  Feeding tube placement EXAM: PORTABLE ABDOMEN - 1 VIEW COMPARISON:  None. FINDINGS: Esophagogastric tube with tip and side port below the diaphragm, tip in the vicinity of the pylorus. The stomach is gas distended. No obvious free air. IMPRESSION: Esophagogastric tube with tip and side port below the diaphragm, tip in the vicinity of the pylorus. The stomach is gas distended. Electronically Signed   By: Eddie Candle M.D.   On: 07/21/2021 11:39   ECHOCARDIOGRAM COMPLETE  Result Date: 07/21/2021    ECHOCARDIOGRAM REPORT   Patient Name:   RAMLA HASE Date of Exam: 07/21/2021 Medical Rec #:  [id number]         Height:       61.0 in Accession #:    [id number]       Weight:       149.0 lb Date of Birth:  July 31, 1966        BSA:          1.667 m Patient Age:    31 years         BP:           94/79 mmHg Patient Gender: F                HR:           86 bpm. Exam Location:  Inpatient Procedure: 2D Echo, Cardiac Doppler and Color Doppler Indications:    CHF  History:        Patient has prior history of Echocardiogram examinations, most                 recent 04/25/2021. CHF, Arrythmias:Atrial Fibrillation,                 Signs/Symptoms:Shock, Obesity; Risk Factors:Diabetes and                 Hypertension.  Sonographer:    Dustin Flock RDCS Referring Phys: [id number] GRACE E BOWSER  Sonographer Comments: Echo performed with patient supine and on artificial respirator. IMPRESSIONS  1. Left ventricular ejection fraction, by estimation, is 50 to 55%. The left ventricle has low normal function. The left ventricle has no regional wall motion abnormalities. There is mild left ventricular hypertrophy. Left ventricular diastolic parameters are consistent with Grade I diastolic dysfunction (impaired relaxation).  2. Right ventricular systolic function is normal. The right ventricular size is normal. There is mildly elevated pulmonary artery systolic pressure.  3. Left atrial size was moderately dilated.  4. The mitral valve is grossly normal. No evidence of mitral valve regurgitation.  5. The aortic valve is tricuspid. Aortic valve regurgitation is not visualized.  6. The inferior vena cava is normal in size with greater than 50% respiratory variability, suggesting right atrial pressure of 3 mmHg. Comparison(s): Changes from prior study are noted. 04/25/2021: LVEF 60-65%. FINDINGS  Left Ventricle: Left ventricular ejection fraction, by estimation, is 50 to 55%. The left ventricle has low normal function. The left ventricle has no regional wall motion abnormalities. The left ventricular internal  cavity size was normal in size. There is  mild left ventricular hypertrophy. Left ventricular diastolic parameters are consistent with Grade I diastolic dysfunction (impaired relaxation). Indeterminate filling pressures. Right Ventricle: The right ventricular size is normal. No increase in right ventricular wall thickness. Right ventricular systolic function is normal. There is mildly elevated pulmonary artery systolic pressure. The tricuspid regurgitant velocity is 3.23  m/s, and with an assumed right atrial pressure of 3 mmHg, the estimated right ventricular systolic pressure is 61.6 mmHg. Left Atrium: Left atrial size was moderately dilated. Right Atrium: Right atrial size was normal in size. Pericardium: There is no evidence of pericardial effusion. Mitral Valve: The mitral valve is grossly normal. No evidence of mitral valve regurgitation. Tricuspid Valve: The tricuspid valve is grossly normal. Tricuspid valve regurgitation is trivial. Aortic Valve: The aortic valve is tricuspid. Aortic valve regurgitation is not visualized. Pulmonic Valve: The pulmonic valve was normal in structure. Pulmonic valve regurgitation is not visualized. Aorta: The aortic root and ascending aorta are structurally normal, with no evidence of dilitation. Venous: The inferior vena cava is normal in size with greater than 50% respiratory variability, suggesting right atrial pressure of 3 mmHg. IAS/Shunts: No atrial level shunt detected by color flow Doppler.  LEFT VENTRICLE PLAX 2D LVIDd:         5.20 cm     Diastology LVIDs:         3.90 cm     LV e' medial:    5.00 cm/s LV PW:         1.20 cm     LV E/e' medial:  8.1 LV IVS:        1.20 cm     LV e' lateral:   5.55 cm/s LVOT diam:     2.10 cm     LV E/e' lateral: 7.3 LV SV:         54 LV SV Index:   32 LVOT Area:     3.46 cm  LV Volumes (MOD) LV vol d, MOD A4C: 67.3 ml LV vol s, MOD A4C: 32.4 ml LV SV MOD A4C:     67.3 ml RIGHT VENTRICLE RV Basal diam:  2.70 cm RV S prime:     12.70 cm/s TAPSE (M-mode): 1.9 cm LEFT ATRIUM              Index       RIGHT ATRIUM           Index LA diam:        4.60 cm 2.76 cm/m  RA Area:     13.80 cm LA Vol (A2C):   91.2 ml 54.72 ml/m RA Volume:   35.00 ml  21.00 ml/m LA Vol (A4C):   47.1 ml 28.26 ml/m LA Biplane Vol: 67.3 ml 40.38 ml/m  AORTIC VALVE LVOT Vmax:   92.40 cm/s LVOT Vmean:  66.000 cm/s LVOT VTI:    0.155 m  AORTA Ao Root diam: 3.30 cm MITRAL VALVE               TRICUSPID VALVE MV Area (PHT): 5.66 cm    TR Peak grad:   41.7 mmHg MV Decel Time: 134 msec    TR Vmax:        323.00 cm/s MV E velocity: 40.70 cm/s MV A velocity: 53.80 cm/s  SHUNTS MV E/A ratio:  0.76        Systemic VTI:  0.16 m  Systemic Diam: 2.10 cm Lyman Bishop MD Electronically signed by Lyman Bishop MD Signature Date/Time: 07/21/2021/4:02:02 PM    Final    CT Maxillofacial Wo Contrast  Result Date: 07/16/2021 CLINICAL DATA:  Facial trauma, swelling. Fall 07/07/2021. Rule out mandibular zygomatic fracture. EXAM: CT MAXILLOFACIAL WITHOUT CONTRAST TECHNIQUE: Multidetector CT imaging of the maxillofacial structures was performed. Multiplanar CT image reconstructions were also generated. COMPARISON:  Head CT 07/07/2021 FINDINGS: Osseous: No acute fracture of the nasal bone, zygomatic arches, or mandibles. The temporomandibular joints are congruent. Patient is edentulous. Orbits: Remote fracture of the left medial orbital wall with herniation of intraorbital fat. No acute orbital fracture. No globe injury. Sinuses: Trace mucosal thickening of the left maxillary sinus. No sinus fracture or fluid level. Mastoid air cells are clear. Soft tissues: Soft tissue edema and stranding involving the right cheek. Limited intracranial: No significant or unexpected finding. IMPRESSION: 1. No acute facial bone fracture. Particularly, no fracture of the right zygomatic arch or mandible. 2. Remote fracture of the left medial orbital wall with herniation of intraorbital fat. 3. Soft tissue edema and stranding  involving the right cheek. Electronically Signed   By: Keith Rake M.D.   On: 07/16/2021 18:35    Microbiology: Recent Results (from the past 240 hour(s))  Urine Culture     Status: Abnormal   Collection Time: 07/23/21 10:17 AM   Specimen: Urine, Clean Catch  Result Value Ref Range Status   Specimen Description URINE, CLEAN CATCH  Final   Special Requests   Final    NONE Performed at [facility], [address], [place], [place] [address]    Culture (A)  Final    20,000 COLONIES/mL VANCOMYCIN RESISTANT ENTEROCOCCUS ISOLATED   Report Status 07/25/2021 FINAL  Final   Organism ID, Bacteria VANCOMYCIN RESISTANT ENTEROCOCCUS ISOLATED (A)  Final      Susceptibility   Vancomycin resistant enterococcus isolated - MIC*    AMPICILLIN >=32 RESISTANT Resistant     NITROFURANTOIN 128 RESISTANT Resistant     VANCOMYCIN >=32 RESISTANT Resistant     LINEZOLID 2 SENSITIVE Sensitive     * 20,000 COLONIES/mL VANCOMYCIN RESISTANT ENTEROCOCCUS ISOLATED     Labs: Basic Metabolic Panel: No results for input(s): NA, K, CL, CO2, GLUCOSE, BUN, CREATININE, CALCIUM, MG, PHOS in the last 168 hours. Liver Function Tests: No results for input(s): AST, ALT, ALKPHOS, BILITOT, PROT, ALBUMIN in the last 168 hours. No results for input(s): LIPASE, AMYLASE in the last 168 hours. No results for input(s): AMMONIA in the last 168 hours. CBC: No results for input(s): WBC, NEUTROABS, HGB, HCT, MCV, PLT in the last 168 hours.  Cardiac Enzymes: No results for input(s): CKTOTAL, CKMB, CKMBINDEX, TROPONINI in the last 168 hours. BNP: BNP (last 3 results) Recent Labs    01/16/21 1055 03/09/21 1735  BNP 352.0* 87.0    ProBNP (last 3 results) No results for input(s): PROBNP in the last 8760 hours.  CBG: Recent Labs  Lab 07/31/21 1311 07/31/21 1616 07/31/21 2050 08/01/21 0048 08/01/21 0426  GLUCAP 201* 210* 75 119* 102*       Signed:  Geradine Girt DO [facility] 08/01/2021, 2:36  PM

## 2021-08-02 LAB — GLUCOSE, CAPILLARY
Glucose-Capillary: 103 mg/dL — ABNORMAL HIGH (ref 70–99)
Glucose-Capillary: 107 mg/dL — ABNORMAL HIGH (ref 70–99)
Glucose-Capillary: 88 mg/dL (ref 70–99)

## 2021-08-02 NOTE — Progress Notes (Signed)
0920: Patient allows nursing to perform their assessment, but is refusing her blood sugar check and refusing her morning medication.

## 2021-08-02 NOTE — TOC Progression Note (Signed)
Transition of Care [facility]) - Progression Note    Patient Details  Name: [name] MRN: [id number] Date of Birth: Oct 09, 1966  [facility]) CM/SW Contact  Elliot Gurney [place], [place] Phone Number: [phone] 08/02/2021, 9:10 AM  Clinical Narrative:    Phone call to [facility], left  voicemail for a return call to discuss discharge to their facility.  Azel Gumina [address] LCSW Transition of Care [phone]   Expected Discharge Plan: Long Term Nursing Home Barriers to Discharge: No Barriers Identified  Expected Discharge Plan and Services Expected Discharge Plan: Long Term Nursing Home In-house Referral: Clinical Social Work   Post Acute Care Choice: [place] Living arrangements for the past 2 months: [place] Expected Discharge Date: 08/02/21                                     Social Determinants of Health (SDOH) Interventions    Readmission Risk Interventions Readmission Risk Prevention Plan 07/25/2021 10/31/2020 08/27/2020  Transportation Screening Complete Complete Complete  Medication Review Press photographer) Complete Complete Complete  PCP or Specialist appointment within 3-5 days of discharge Complete - Complete  HRI or Home Care Consult Complete - Complete  SW Recovery Care/Counseling Consult Complete Complete Complete  Palliative Care Screening Not Applicable Not Applicable Not Applicable  Skilled Nursing Facility Complete Complete Complete  Some recent data might be hidden

## 2021-08-02 NOTE — TOC Progression Note (Addendum)
Transition of Care [facility]) - Progression Note    Patient Details  Name: [name] MRN: [id number] Date of Birth: 07-23-1966  Transition of Care [facility]) CM/SW Contact  Elliot Gurney [place], [place] Phone Number: [phone] 08/02/2021, 11:02 AM  Clinical Narrative:    Phone call to [facility], left  voicemail for [place] requesting a return call to discuss discharge to confirm discharge to their facility today. Facility contacted directly as well, no answer.  12:30 pm Admissions coordinator contacted, spoke with Shirlee Limerick patient's admission confirmed. Patient will be going to room 106 A and will be transported by [facility].  Please call report to [phone]  2:17 Patient's sister notified of patient's discharge to [facility] [place] Transition of Care [phone]    Expected Discharge Plan: [place] Barriers to Discharge: No Barriers Identified  Expected Discharge Plan and Services Expected Discharge Plan: [place] In-house Referral: Clinical Social Work   Post Acute Care Choice: [place] Living arrangements for the past 2 months: [place] Expected Discharge Date: 08/02/21                                     Social Determinants of Health (SDOH) Interventions    Readmission Risk Interventions Readmission Risk Prevention Plan 07/25/2021 10/31/2020 08/27/2020  Transportation Screening Complete Complete Complete  Medication Review Press photographer) Complete Complete Complete  PCP or Specialist appointment within 3-5 days of discharge Complete - Complete  HRI or Home Care Consult Complete - Complete  SW Recovery Care/Counseling Consult Complete Complete Complete  Palliative Care Screening Not Applicable Not Applicable Not Applicable  Skilled Nursing Facility Complete Complete Complete  Some recent data might be hidden

## 2021-08-02 NOTE — Progress Notes (Addendum)
1351: Report given to Lorne Skeens, nurse at [facility] regarding patient. Time for questions and clarification given.

## 2021-08-04 DIAGNOSIS — K219 Gastro-esophageal reflux disease without esophagitis: Secondary | ICD-10-CM | POA: Diagnosis not present

## 2021-08-04 DIAGNOSIS — E1151 Type 2 diabetes mellitus with diabetic peripheral angiopathy without gangrene: Secondary | ICD-10-CM | POA: Diagnosis not present

## 2021-08-04 DIAGNOSIS — I739 Peripheral vascular disease, unspecified: Secondary | ICD-10-CM | POA: Diagnosis not present

## 2021-08-04 DIAGNOSIS — D638 Anemia in other chronic diseases classified elsewhere: Secondary | ICD-10-CM | POA: Diagnosis not present

## 2021-08-04 DIAGNOSIS — R627 Adult failure to thrive: Secondary | ICD-10-CM | POA: Diagnosis not present

## 2021-08-04 DIAGNOSIS — S81809A Unspecified open wound, unspecified lower leg, initial encounter: Secondary | ICD-10-CM | POA: Diagnosis not present

## 2021-08-04 DIAGNOSIS — I4891 Unspecified atrial fibrillation: Secondary | ICD-10-CM | POA: Diagnosis not present

## 2021-08-04 DIAGNOSIS — G4701 Insomnia due to medical condition: Secondary | ICD-10-CM | POA: Diagnosis not present

## 2021-08-04 DIAGNOSIS — I10 Essential (primary) hypertension: Secondary | ICD-10-CM | POA: Diagnosis not present

## 2021-08-06 ENCOUNTER — Ambulatory Visit: Payer: Medicare Other | Admitting: Adult Health

## 2021-08-17 DIAGNOSIS — M6281 Muscle weakness (generalized): Secondary | ICD-10-CM | POA: Diagnosis not present

## 2021-08-17 DIAGNOSIS — J441 Chronic obstructive pulmonary disease with (acute) exacerbation: Secondary | ICD-10-CM | POA: Diagnosis not present

## 2021-08-17 DIAGNOSIS — R269 Unspecified abnormalities of gait and mobility: Secondary | ICD-10-CM | POA: Diagnosis not present

## 2021-08-17 DIAGNOSIS — I639 Cerebral infarction, unspecified: Secondary | ICD-10-CM | POA: Diagnosis not present

## 2021-08-19 DIAGNOSIS — I739 Peripheral vascular disease, unspecified: Secondary | ICD-10-CM | POA: Diagnosis not present

## 2021-08-19 DIAGNOSIS — I10 Essential (primary) hypertension: Secondary | ICD-10-CM | POA: Diagnosis not present

## 2021-08-19 DIAGNOSIS — I4891 Unspecified atrial fibrillation: Secondary | ICD-10-CM | POA: Diagnosis not present

## 2021-08-19 DIAGNOSIS — I70209 Unspecified atherosclerosis of native arteries of extremities, unspecified extremity: Secondary | ICD-10-CM | POA: Diagnosis not present

## 2021-08-19 DIAGNOSIS — K219 Gastro-esophageal reflux disease without esophagitis: Secondary | ICD-10-CM | POA: Diagnosis not present

## 2021-08-19 DIAGNOSIS — D638 Anemia in other chronic diseases classified elsewhere: Secondary | ICD-10-CM | POA: Diagnosis not present

## 2021-08-19 DIAGNOSIS — E1151 Type 2 diabetes mellitus with diabetic peripheral angiopathy without gangrene: Secondary | ICD-10-CM | POA: Diagnosis not present

## 2021-08-19 DIAGNOSIS — R627 Adult failure to thrive: Secondary | ICD-10-CM | POA: Diagnosis not present

## 2021-08-20 DIAGNOSIS — I10 Essential (primary) hypertension: Secondary | ICD-10-CM | POA: Diagnosis not present

## 2021-08-20 DIAGNOSIS — D649 Anemia, unspecified: Secondary | ICD-10-CM | POA: Diagnosis not present

## 2021-09-01 DIAGNOSIS — I10 Essential (primary) hypertension: Secondary | ICD-10-CM | POA: Diagnosis not present

## 2021-09-01 DIAGNOSIS — I739 Peripheral vascular disease, unspecified: Secondary | ICD-10-CM | POA: Diagnosis not present

## 2021-09-01 DIAGNOSIS — E1151 Type 2 diabetes mellitus with diabetic peripheral angiopathy without gangrene: Secondary | ICD-10-CM | POA: Diagnosis not present

## 2021-09-01 DIAGNOSIS — I5030 Unspecified diastolic (congestive) heart failure: Secondary | ICD-10-CM | POA: Diagnosis not present

## 2021-09-01 DIAGNOSIS — I4891 Unspecified atrial fibrillation: Secondary | ICD-10-CM | POA: Diagnosis not present

## 2021-09-01 DIAGNOSIS — R627 Adult failure to thrive: Secondary | ICD-10-CM | POA: Diagnosis not present

## 2021-09-01 DIAGNOSIS — D638 Anemia in other chronic diseases classified elsewhere: Secondary | ICD-10-CM | POA: Diagnosis not present

## 2021-09-02 ENCOUNTER — Ambulatory Visit (INDEPENDENT_AMBULATORY_CARE_PROVIDER_SITE_OTHER): Payer: Medicare Other | Admitting: Physician Assistant

## 2021-09-02 ENCOUNTER — Ambulatory Visit (INDEPENDENT_AMBULATORY_CARE_PROVIDER_SITE_OTHER)
Admission: RE | Admit: 2021-09-02 | Discharge: 2021-09-02 | Disposition: A | Discharge status: Home / Self Care | Payer: Medicare Other | Source: Ambulatory Visit | Attending: Vascular Surgery | Admitting: Vascular Surgery

## 2021-09-02 ENCOUNTER — Ambulatory Visit (HOSPITAL_COMMUNITY)
Admission: RE | Admit: 2021-09-02 | Discharge: 2021-09-02 | Disposition: A | Discharge status: Home / Self Care | Payer: Medicare Other | Source: Ambulatory Visit | Attending: Vascular Surgery | Admitting: Vascular Surgery

## 2021-09-02 ENCOUNTER — Other Ambulatory Visit: Payer: Self-pay

## 2021-09-02 VITALS — BP 102/82 | HR 103 | Temp 98.6°F | Resp 20 | Ht 61.0 in | Wt 115.1 lb

## 2021-09-02 DIAGNOSIS — S81801A Unspecified open wound, right lower leg, initial encounter: Secondary | ICD-10-CM | POA: Diagnosis not present

## 2021-09-02 DIAGNOSIS — K828 Other specified diseases of gallbladder: Secondary | ICD-10-CM | POA: Diagnosis not present

## 2021-09-02 DIAGNOSIS — I739 Peripheral vascular disease, unspecified: Secondary | ICD-10-CM

## 2021-09-02 DIAGNOSIS — S81802A Unspecified open wound, left lower leg, initial encounter: Secondary | ICD-10-CM

## 2021-09-03 ENCOUNTER — Encounter: Payer: Self-pay | Admitting: Physician Assistant

## 2021-09-03 NOTE — Progress Notes (Signed)
Office Note     CC:  follow up Requesting Provider:  Caprice Renshaw, MD  HPI: [name] is a 55 y.o. (1966-04-20) female who presents for follow-up of PAD.  She is accompanied by her sister today.  She currently resides at [facility] facility.  She is status post right SFA stenting by Dr. Stanford Breed in June 2022.  She has chronic wounds of bilateral ankles.  She has been dealing with these for more than a year.  The patient and her sister believe the left ankle wounds are improving and nearly healed.  She is having pain however around the area of her wound on her right Achilles.  She denies fevers, chills, nausea/vomiting.  She is on antibiotics.  She is nonambulatory.  Past medical history also significant for insulin-dependent diabetes mellitus.  Past Medical History:  Diagnosis Date   Alcohol use    Ankle fracture, lateral malleolus, closed 2013   Anxiety    Aortic atherosclerosis ([place]) 03/10/2021   Breast mass, left 2013   CHF (congestive heart failure) ([place])    a. EF 20-25% by echo in 05/2016 with cath showing normal cors b. EF 50-55% in 07/2020 c. 01/2021: EF at 55-60% with moderate LVH   Chronic anemia    CKD (chronic kidney disease), stage III (HCC)    Cocaine abuse (HCC)    COPD (chronic obstructive pulmonary disease) ([place])    Diabetes mellitus, type 2 ([place])    Diabetic Charcot foot ([place])    Essential hypertension    History of cardiomyopathy    History of GI bleed 2011   Hyperlipidemia    Noncompliance    Obesity    PAF (paroxysmal atrial fibrillation) (HCC)    Panic attacks    PAT (paroxysmal atrial tachycardia) (HCC)    Previously on Amiodarone   Seizures ([place]) 10/01/2020   Sleep apnea    Not on CPAP   Stroke ([place]) 2018   Tobacco abuse    Urinary incontinence     Past Surgical History:  Procedure Laterality Date   ANGIOPLASTY ILLIAC ARTERY Right 04/23/2021   Procedure: ANGIOPLASTY AND STENT SUPERFICIAL FEMORAL ARTERY;  Surgeon: Cherre Robins, MD;   Location: [place];  Service: Vascular;  Laterality: Right;   AORTOGRAM  04/23/2021   Procedure: AORTOGRAM;  Surgeon: Cherre Robins, MD;  Location: [place];  Service: Vascular;;   AORTOGRAM Left 04/30/2021   Procedure: left lower extremity angiogram with second order cannulation;  Surgeon: Cherre Robins, MD;  Location: [place];  Service: Vascular;  Laterality: Left;   BIOPSY  11/12/2020   Procedure: BIOPSY;  Surgeon: Harvel Quale, MD;  [facility];  Service: Gastroenterology;;   BREAST BIOPSY     CARDIAC CATHETERIZATION N/A 07/28/2016   Procedure: Left Heart Cath and Coronary Angiography;  Surgeon: Jettie Booze, MD;  Location: [place] [facility];  Service: Cardiovascular;  Laterality: N/A;   COLONOSCOPY N/A 05/10/2019   Procedure: COLONOSCOPY;  Surgeon: Danie Binder, MD;  [facility];  Service: Endoscopy;  Laterality: N/A;  Phenergan 12.5 mg IV in pre-op   DILATION AND CURETTAGE OF UTERUS     ESOPHAGOGASTRODUODENOSCOPY (EGD) WITH PROPOFOL N/A 11/12/2020   Procedure: ESOPHAGOGASTRODUODENOSCOPY (EGD) WITH PROPOFOL;  Surgeon: Harvel Quale, MD;  [facility];  Service: Gastroenterology;  Laterality: N/A;   I & D EXTREMITY Bilateral 09/22/2017   Procedure: BILATERAL DEBRIDEMENT LEG/FOOT ULCERS, APPLY VERAFLO WOUND VAC;  Surgeon: Newt Minion,  MD;  Location: [place];  Service: Orthopedics;  Laterality: Bilateral;   I & D EXTREMITY Right 10/11/2018   Procedure: IRRIGATION AND DEBRIDEMENT RIGHT HAND;  Surgeon: [name], MD;  Location: [place];  Service: Orthopedics;  Laterality: Right;   I & D EXTREMITY Right 10/13/2018   Procedure: REPEAT IRRIGATION AND DEBRIDEMENT RIGHT HAND;  Surgeon: [name], MD;  Location: [place];  Service: Orthopedics;  Laterality: Right;   I & D EXTREMITY Right 11/22/2018   Procedure: IRRIGATION AND DEBRIDEMENT AND PINNING RIGHT HAND;  Surgeon: [name], MD;  Location: [place];  Service: Orthopedics;   Laterality: Right;   IR RADIOLOGIST EVAL & MGMT  07/05/2018   LOWER EXTREMITY ANGIOGRAM Bilateral 04/23/2021   Procedure: RIGHT  AND LEFT LOWER EXTREMITY ANGIOGRAM;  Surgeon: Cherre Robins, MD;  Location: [place];  Service: Vascular;  Laterality: Bilateral;   POLYPECTOMY  05/10/2019   Procedure: POLYPECTOMY;  Surgeon: Danie Binder, MD;  [facility];  Service: Endoscopy;;   SKIN SPLIT GRAFT Bilateral 09/28/2017   Procedure: BILATERAL SPLIT THICKNESS SKIN GRAFT LEGS/FEET AND APPLY VAC;  Surgeon: Newt Minion, MD;  Location: [place];  Service: Orthopedics;  Laterality: Bilateral;   SKIN SPLIT GRAFT Right 11/22/2018   Procedure: SKIN GRAFT SPLIT THICKNESS;  Surgeon: [name], MD;  Location: [place];  Service: Orthopedics;  Laterality: Right;    Social History   Socioeconomic History   Marital status: Widowed    Spouse name: Not on file   Number of children: 2   Years of education: 9   Highest education level: 9th grade  Occupational History   Occupation: Disabled  Tobacco Use   Smoking status: Some Days    Packs/day: 0.25    Years: 20.00    Pack years: 5.00    Types: Cigarettes    Last attempt to quit: 06/03/2017    Years since quitting: 4.2   Smokeless tobacco: Never  Vaping Use   Vaping Use: Never used  Substance and Sexual Activity   Alcohol use: Not Currently    Alcohol/week: 0.0 standard drinks    Comment: Quit in 2017   Drug use: Not Currently    Frequency: 3.0 times per week    Types: Marijuana, Cocaine    Comment: none since August 2018   Sexual activity: Not on file  Other Topics Concern   Not on file  Social History Narrative   Right handed   Drinks 1-2 cups caffeine daily   Social Determinants of Health   Financial Resource Strain: Medium Risk   Difficulty of Paying Living Expenses: Somewhat hard  Food Insecurity: Food Insecurity Present   Worried About Charity fundraiser in the Last Year: Sometimes true   Ran Out of Food in the Last Year:  Sometimes true  Transportation Needs: No Transportation Needs   Lack of Transportation (Medical): No   Lack of Transportation (Non-Medical): No  Physical Activity: Insufficiently Active   Days of Exercise per Week: 7 days   Minutes of Exercise per Session: 20 min  Stress: Stress Concern Present   Feeling of Stress : To some extent  Social Connections: Moderately Isolated   Frequency of Communication with Friends and Family: More than three times a week   Frequency of Social Gatherings with Friends and Family: More than three times a week   Attends Religious Services: More than 4 times per year   Active Member of Clubs or Organizations: No   Attends [employer]  or Organization Meetings: Never   Marital Status: Widowed  Human resources officer Violence: Not At Risk   Fear of Current or Ex-Partner: No   Emotionally Abused: No   Physically Abused: No   Sexually Abused: No    Family History  Problem Relation Age of Onset   Hypertension Mother    Heart attack Mother    Hypertension Father        CABG    Hypertension Sister    Hypertension Brother    Hypertension Sister    Cancer Sister        breast    Arthritis Other    Cancer Other    Diabetes Other    Asthma Other    Hypertension Daughter    Hypertension Son     Current Outpatient Medications  Medication Sig Dispense Refill   acetaminophen (TYLENOL) 325 MG tablet Take 2 tablets (650 mg total) by mouth every 6 (six) hours as needed for headache, fever or mild pain. (Patient taking differently: Take 650 mg by mouth every 6 (six) hours as needed for fever (or pain).)     amLODipine (NORVASC) 10 MG tablet Take 1 tablet (10 mg total) by mouth daily. 30 tablet 5   aspirin EC 81 MG EC tablet Take 1 tablet (81 mg total) by mouth daily. Swallow whole. 30 tablet 11   benztropine (COGENTIN) 0.5 MG tablet Take 1 tablet (0.5 mg total) by mouth 2 (two) times daily. 60 tablet 0   ELIQUIS 5 MG TABS tablet Take 5 mg by mouth 2 (two) times daily.      feeding supplement (ENSURE ENLIVE / ENSURE PLUS) LIQD Take 237 mLs by mouth 3 (three) times daily between meals. 237 mL 12   FLUoxetine (PROZAC) 20 MG capsule Take 1 capsule (20 mg total) by mouth daily. 30 capsule 0   FLUoxetine (PROZAC) 40 MG capsule Take 40 mg by mouth daily.     HUMALOG KWIKPEN 100 UNIT/ML KwikPen Inject 1-10 Units into the skin See admin instructions. Inject 1-10 units into the skin before meals and at bedtime, PER SLIDING SCALE: BGL 151-200 = 1 unit; 201-250 = 2 units; 251-300 = 4 units; 301-350 = 6 units; 351-400 = 8 units; 401-450 = 10 units     levETIRAcetam (KEPPRA) 500 MG tablet Take 1 tablet (500 mg total) by mouth 2 (two) times daily.     metoprolol succinate (TOPROL-XL) 25 MG 24 hr tablet Take 50 mg by mouth daily.     Multiple Vitamin (MULTIVITAMIN WITH MINERALS) TABS tablet Take 1 tablet by mouth daily.     omeprazole (PRILOSEC) 20 MG capsule Take 20 mg by mouth daily before breakfast.     oxybutynin (DITROPAN-XL) 5 MG 24 hr tablet Take 5 mg by mouth in the morning.     rosuvastatin (CRESTOR) 10 MG tablet Take 10 mg by mouth every evening.     sennosides-docusate sodium (SENOKOT-S) 8.6-50 MG tablet Take 1 tablet by mouth in the morning and at bedtime.     thiamine 100 MG tablet Take 1 tablet (100 mg total) by mouth daily.     tiotropium (SPIRIVA HANDIHALER) 18 MCG inhalation capsule Place 18 mcg into inhaler and inhale daily as needed (Shortness of breath).     zinc sulfate 220 (50 Zn) MG capsule Take 1 capsule (220 mg total) by mouth daily. 30 capsule 2   No current facility-administered medications for this visit.    Allergies  Allergen Reactions   Sodium Hypochlorite Anaphylaxis    (  Bleach wipes) "I am not able to breathe"     REVIEW OF SYSTEMS:   [X]  denotes positive finding, [ ]  denotes negative finding Cardiac  Comments:  Chest pain or chest pressure:    Shortness of breath upon exertion:    Short of breath when lying flat:    Irregular heart  rhythm:        Vascular    Pain in calf, thigh, or hip brought on by ambulation:    Pain in feet at night that wakes you up from your sleep:     Blood clot in your veins:    Leg swelling:         Pulmonary    Oxygen at home:    Productive cough:     Wheezing:         Neurologic    Sudden weakness in arms or legs:     Sudden numbness in arms or legs:     Sudden onset of difficulty speaking or slurred speech:    Temporary loss of vision in one eye:     Problems with dizziness:         Gastrointestinal    Blood in stool:     Vomited blood:         Genitourinary    Burning when urinating:     Blood in urine:        Psychiatric    Major depression:         Hematologic    Bleeding problems:    Problems with blood clotting too easily:        Skin    Rashes or ulcers:        Constitutional    Fever or chills:      PHYSICAL EXAMINATION:  Vitals:   09/02/21 1456  BP: 102/82  Pulse: (!) 103  Resp: 20  Temp: 98.6 F (37 C)  TempSrc: Temporal  Weight: 115 lb 1.6 oz (52.2 kg)  Height: 5\' 1"  (1.549 m)    General:  WDWN in NAD; vital signs documented above Gait: Not observed HENT: WNL, normocephalic Pulmonary: normal non-labored breathing  Cardiac: regular HR Abdomen: soft, NT, no masses Skin: without rashes Vascular Exam/Pulses:  Right Left  Radial 2+ (normal) 2+ (normal)  DP 2+ (normal) 2+ (normal)   Extremities: Exposed right Achilles tendon with bloody drainage and pain to touch; superficial wounds of left anterior ankle no sign of infection Musculoskeletal: no muscle wasting or atrophy  Neurologic: A&O X 3;  No focal weakness or paresthesias are detected Psychiatric:  The pt has Normal affect.   Non-Invasive Vascular Imaging:   Right SFA stent patent ABIs within normal limits    ASSESSMENT/PLAN:: 55 y.o. female here for follow-up of PAD and nonhealing wounds of bilateral ankles  -Bilateral lower extremities are well perfused with easily palpable  DP pulses.  Arterial duplex performed today also demonstrates a widely patent right SFA stent without any hemodynamically significant stenosis -Despite physical exam and arterial duplex findings patient has a worsening right ankle wound with exposed Achilles tendon.  Case was discussed with Dr. Stanford Breed and he believes patient is optimized from a vascular surgery standpoint.  Patient was offered a right leg amputation however she is currently adamantly against this.  Patient was made aware that if this were to become infected this could potentially take her life.  I have written instructions for her nursing facility to cleanse the wound with soap and water twice daily and perform wet-to-dry  dressing changes.  We will also try to refer her to a wound clinic for more intensive wound care. -She will call the office if she changes her mind about proceeding with amputation.  Otherwise she will follow-up for routine surveillance of SFA stent.   Dagoberto Ligas, PA-C [facility] [phone]  Clinic MD:   Stanford Breed

## 2021-09-04 ENCOUNTER — Other Ambulatory Visit: Payer: Self-pay

## 2021-09-04 DIAGNOSIS — I739 Peripheral vascular disease, unspecified: Secondary | ICD-10-CM

## 2021-09-05 DIAGNOSIS — I5030 Unspecified diastolic (congestive) heart failure: Secondary | ICD-10-CM | POA: Diagnosis not present

## 2021-09-05 DIAGNOSIS — E1151 Type 2 diabetes mellitus with diabetic peripheral angiopathy without gangrene: Secondary | ICD-10-CM | POA: Diagnosis not present

## 2021-09-05 DIAGNOSIS — I10 Essential (primary) hypertension: Secondary | ICD-10-CM | POA: Diagnosis not present

## 2021-09-05 DIAGNOSIS — I4891 Unspecified atrial fibrillation: Secondary | ICD-10-CM | POA: Diagnosis not present

## 2021-09-05 DIAGNOSIS — D638 Anemia in other chronic diseases classified elsewhere: Secondary | ICD-10-CM | POA: Diagnosis not present

## 2021-09-09 ENCOUNTER — Encounter (HOSPITAL_BASED_OUTPATIENT_CLINIC_OR_DEPARTMENT_OTHER): Payer: Medicare Other | Attending: Internal Medicine | Admitting: Internal Medicine

## 2021-09-16 DIAGNOSIS — I4891 Unspecified atrial fibrillation: Secondary | ICD-10-CM | POA: Diagnosis not present

## 2021-09-16 DIAGNOSIS — L98429 Non-pressure chronic ulcer of back with unspecified severity: Secondary | ICD-10-CM | POA: Diagnosis not present

## 2021-09-16 DIAGNOSIS — R627 Adult failure to thrive: Secondary | ICD-10-CM | POA: Diagnosis not present

## 2021-09-16 DIAGNOSIS — K219 Gastro-esophageal reflux disease without esophagitis: Secondary | ICD-10-CM | POA: Diagnosis not present

## 2021-09-16 DIAGNOSIS — I5030 Unspecified diastolic (congestive) heart failure: Secondary | ICD-10-CM | POA: Diagnosis not present

## 2021-09-16 DIAGNOSIS — I739 Peripheral vascular disease, unspecified: Secondary | ICD-10-CM | POA: Diagnosis not present

## 2021-09-16 DIAGNOSIS — K269 Duodenal ulcer, unspecified as acute or chronic, without hemorrhage or perforation: Secondary | ICD-10-CM | POA: Diagnosis not present

## 2021-09-16 DIAGNOSIS — I10 Essential (primary) hypertension: Secondary | ICD-10-CM | POA: Diagnosis not present

## 2021-09-16 DIAGNOSIS — E1151 Type 2 diabetes mellitus with diabetic peripheral angiopathy without gangrene: Secondary | ICD-10-CM | POA: Diagnosis not present

## 2021-09-16 DIAGNOSIS — D638 Anemia in other chronic diseases classified elsewhere: Secondary | ICD-10-CM | POA: Diagnosis not present

## 2021-09-17 DIAGNOSIS — R269 Unspecified abnormalities of gait and mobility: Secondary | ICD-10-CM | POA: Diagnosis not present

## 2021-09-17 DIAGNOSIS — I639 Cerebral infarction, unspecified: Secondary | ICD-10-CM | POA: Diagnosis not present

## 2021-09-17 DIAGNOSIS — M6281 Muscle weakness (generalized): Secondary | ICD-10-CM | POA: Diagnosis not present

## 2021-09-17 DIAGNOSIS — J441 Chronic obstructive pulmonary disease with (acute) exacerbation: Secondary | ICD-10-CM | POA: Diagnosis not present

## 2021-09-29 DIAGNOSIS — I10 Essential (primary) hypertension: Secondary | ICD-10-CM | POA: Diagnosis not present

## 2021-09-29 DIAGNOSIS — I4891 Unspecified atrial fibrillation: Secondary | ICD-10-CM | POA: Diagnosis not present

## 2021-09-29 DIAGNOSIS — R627 Adult failure to thrive: Secondary | ICD-10-CM | POA: Diagnosis not present

## 2021-09-29 DIAGNOSIS — K219 Gastro-esophageal reflux disease without esophagitis: Secondary | ICD-10-CM | POA: Diagnosis not present

## 2021-09-29 DIAGNOSIS — I739 Peripheral vascular disease, unspecified: Secondary | ICD-10-CM | POA: Diagnosis not present

## 2021-10-06 DIAGNOSIS — G4701 Insomnia due to medical condition: Secondary | ICD-10-CM | POA: Diagnosis not present

## 2021-10-10 DIAGNOSIS — E1151 Type 2 diabetes mellitus with diabetic peripheral angiopathy without gangrene: Secondary | ICD-10-CM | POA: Diagnosis not present

## 2021-10-10 DIAGNOSIS — D638 Anemia in other chronic diseases classified elsewhere: Secondary | ICD-10-CM | POA: Diagnosis not present

## 2021-10-10 DIAGNOSIS — I10 Essential (primary) hypertension: Secondary | ICD-10-CM | POA: Diagnosis not present

## 2021-10-10 DIAGNOSIS — I5030 Unspecified diastolic (congestive) heart failure: Secondary | ICD-10-CM | POA: Diagnosis not present

## 2021-10-10 DIAGNOSIS — I4891 Unspecified atrial fibrillation: Secondary | ICD-10-CM | POA: Diagnosis not present

## 2021-10-16 DIAGNOSIS — Z794 Long term (current) use of insulin: Secondary | ICD-10-CM | POA: Diagnosis not present

## 2021-10-16 DIAGNOSIS — K219 Gastro-esophageal reflux disease without esophagitis: Secondary | ICD-10-CM | POA: Diagnosis not present

## 2021-10-16 DIAGNOSIS — J309 Allergic rhinitis, unspecified: Secondary | ICD-10-CM | POA: Diagnosis not present

## 2021-10-16 DIAGNOSIS — R627 Adult failure to thrive: Secondary | ICD-10-CM | POA: Diagnosis not present

## 2021-10-16 DIAGNOSIS — I10 Essential (primary) hypertension: Secondary | ICD-10-CM | POA: Diagnosis not present

## 2021-10-16 DIAGNOSIS — I4891 Unspecified atrial fibrillation: Secondary | ICD-10-CM | POA: Diagnosis not present

## 2021-10-16 DIAGNOSIS — I739 Peripheral vascular disease, unspecified: Secondary | ICD-10-CM | POA: Diagnosis not present

## 2021-10-17 DIAGNOSIS — M6281 Muscle weakness (generalized): Secondary | ICD-10-CM | POA: Diagnosis not present

## 2021-10-17 DIAGNOSIS — R269 Unspecified abnormalities of gait and mobility: Secondary | ICD-10-CM | POA: Diagnosis not present

## 2021-10-17 DIAGNOSIS — I639 Cerebral infarction, unspecified: Secondary | ICD-10-CM | POA: Diagnosis not present

## 2021-10-17 DIAGNOSIS — J441 Chronic obstructive pulmonary disease with (acute) exacerbation: Secondary | ICD-10-CM | POA: Diagnosis not present

## 2021-10-26 DIAGNOSIS — E1151 Type 2 diabetes mellitus with diabetic peripheral angiopathy without gangrene: Secondary | ICD-10-CM | POA: Insufficient documentation

## 2021-10-26 DIAGNOSIS — Z9181 History of falling: Secondary | ICD-10-CM | POA: Insufficient documentation

## 2021-10-30 NOTE — Patient Outreach (Signed)
[facility]) Care Management  10/30/2021  [name] Jun 21, 1966 [id number]   Received [facility] ACO list referral sent patient information to Valente David, RN Care Coordinator for follow up.  Thank you, [place] Care Management Assistant

## 2021-10-31 ENCOUNTER — Other Ambulatory Visit: Payer: Self-pay | Admitting: *Deleted

## 2021-10-31 NOTE — Patient Outreach (Signed)
[facility]) Care Management  10/31/2021  [name] 09-21-1966 [id number]   Referral Date: 1/5 Referral Source: Insurance Referral Reason: Recurrent admissions Insurance: [facility]   Upon chart review, noted that after last discharge on 10/8, member was admitted to [facility].  Call placed to SNF, confirmed that member remains there for long term care.    Plan: RN CM will close case at this time as member is at [facility] for long term care.  Valente David, RN, MSN, [place] Manager [phone]

## 2021-11-04 ENCOUNTER — Encounter: Payer: Self-pay | Admitting: Adult Health

## 2021-11-04 ENCOUNTER — Ambulatory Visit (INDEPENDENT_AMBULATORY_CARE_PROVIDER_SITE_OTHER): Payer: Medicare Other | Admitting: Adult Health

## 2021-11-04 VITALS — BP 106/84 | HR 110 | Ht 61.0 in

## 2021-11-04 DIAGNOSIS — R569 Unspecified convulsions: Secondary | ICD-10-CM | POA: Diagnosis not present

## 2021-11-04 NOTE — Patient Instructions (Signed)
Your Plan:  Continue Keppra 500 mg twice a day  If your symptoms worsen or you develop new symptoms please let us know.    Thank you for coming to see [name] at [facility]. I hope we have been able to provide you high quality care today.  You may receive a patient satisfaction survey over the next few weeks. We would appreciate your feedback and comments so that we may continue to improve ourselves and the health of our patients.

## 2021-11-05 NOTE — Progress Notes (Signed)
PATIENT: [name] DOB: 05/15/1966  REASON FOR VISIT: follow up HISTORY FROM: patient PRIMARY NEUROLOGIST: Dr. Jannifer Franklin  HISTORY OF PRESENT ILLNESS: Today 11/05/21:  [name] is a 56 year old female with a history of a seizure event in October 2021 after being diagnosed with DKA.  Since then the patient has not had any seizure events.  At the last visit an EEG was ordered but never completed.  The patient states that she does not wish to come off of Keppra.  She does not want to repeat an EEG.  The patient is currently in a skilled nursing facility.  She denies any seizure events.  She returns today for follow-up with her sister  HISTORY Copied from Butler Denmark NP:  01/30/21 [name] is a 56 year old female who had a seizure in October 2021, she was admitted in DKA with a blood sugar over 700.  She was placed on Keppra.  Noted to have a small subcortical stroke in the right basal ganglia area, has some left-sided weakness.  No further seizures.  Hospitalized in February 2022 for Covid pneumonia. She lives in a [facility] in [place], since October, trying to find an [facility] facility, doesn't have anyone to take care of her at home.  Is nonambulatory, can stand and pivot.  No further seizures.  Continues to have poor control diabetes, reports blood sugar reads "high" when checked at facility.  Last A1c in February was 11.1.  Remains on Eliquis.  Here today for evaluation unaccompanied.  REVIEW OF SYSTEMS: Out of a complete 14 system review of symptoms, the patient complains only of the following symptoms, and all other reviewed systems are negative.  ALLERGIES: Allergies  Allergen Reactions   Sodium Hypochlorite Anaphylaxis    (Bleach wipes) "I am not able to breathe"    HOME MEDICATIONS: Outpatient Medications Prior to Visit  Medication Sig Dispense Refill   acetaminophen (TYLENOL) 325 MG tablet Take 2 tablets (650 mg total) by mouth every 6 (six) hours as needed for  headache, fever or mild pain. (Patient taking differently: Take 650 mg by mouth every 6 (six) hours as needed for fever (or pain).)     amLODipine (NORVASC) 10 MG tablet Take 1 tablet (10 mg total) by mouth daily. 30 tablet 5   aspirin EC 81 MG EC tablet Take 1 tablet (81 mg total) by mouth daily. Swallow whole. 30 tablet 11   benztropine (COGENTIN) 0.5 MG tablet Take 1 tablet (0.5 mg total) by mouth 2 (two) times daily. 60 tablet 0   ELIQUIS 5 MG TABS tablet Take 5 mg by mouth 2 (two) times daily.     feeding supplement (ENSURE ENLIVE / ENSURE PLUS) LIQD Take 237 mLs by mouth 3 (three) times daily between meals. 237 mL 12   FLUoxetine (PROZAC) 20 MG capsule Take 1 capsule (20 mg total) by mouth daily. 30 capsule 0   FLUoxetine (PROZAC) 40 MG capsule Take 40 mg by mouth daily.     HUMALOG KWIKPEN 100 UNIT/ML KwikPen Inject 1-10 Units into the skin See admin instructions. Inject 1-10 units into the skin before meals and at bedtime, PER SLIDING SCALE: BGL 151-200 = 1 unit; 201-250 = 2 units; 251-300 = 4 units; 301-350 = 6 units; 351-400 = 8 units; 401-450 = 10 units     levETIRAcetam (KEPPRA) 500 MG tablet Take 1 tablet (500 mg total) by mouth 2 (two) times daily.     metoprolol succinate (TOPROL-XL) 25 MG 24 hr  tablet Take 50 mg by mouth daily.     Multiple Vitamin (MULTIVITAMIN WITH MINERALS) TABS tablet Take 1 tablet by mouth daily.     omeprazole (PRILOSEC) 20 MG capsule Take 20 mg by mouth daily before breakfast.     oxybutynin (DITROPAN-XL) 5 MG 24 hr tablet Take 5 mg by mouth in the morning.     rosuvastatin (CRESTOR) 10 MG tablet Take 10 mg by mouth every evening.     sennosides-docusate sodium (SENOKOT-S) 8.6-50 MG tablet Take 1 tablet by mouth in the morning and at bedtime.     thiamine 100 MG tablet Take 1 tablet (100 mg total) by mouth daily.     tiotropium (SPIRIVA HANDIHALER) 18 MCG inhalation capsule Place 18 mcg into inhaler and inhale daily as needed (Shortness of breath).     zinc  sulfate 220 (50 Zn) MG capsule Take 1 capsule (220 mg total) by mouth daily. 30 capsule 2   No facility-administered medications prior to visit.    PAST MEDICAL HISTORY: Past Medical History:  Diagnosis Date   Alcohol use    Ankle fracture, lateral malleolus, closed 2013   Anxiety    Aortic atherosclerosis ([place]) 03/10/2021   Breast mass, left 2013   CHF (congestive heart failure) ([place])    a. EF 20-25% by echo in 05/2016 with cath showing normal cors b. EF 50-55% in 07/2020 c. 01/2021: EF at 55-60% with moderate LVH   Chronic anemia    CKD (chronic kidney disease), stage III (HCC)    Cocaine abuse (HCC)    COPD (chronic obstructive pulmonary disease) ([place])    Diabetes mellitus, type 2 ([place])    Diabetic Charcot foot ([place])    Essential hypertension    History of cardiomyopathy    History of GI bleed 2011   Hyperlipidemia    Noncompliance    Obesity    PAF (paroxysmal atrial fibrillation) (HCC)    Panic attacks    PAT (paroxysmal atrial tachycardia) (HCC)    Previously on Amiodarone   Seizures ([place]) 10/01/2020   Sleep apnea    Not on CPAP   Stroke ([place]) 2018   Tobacco abuse    Urinary incontinence     PAST SURGICAL HISTORY: Past Surgical History:  Procedure Laterality Date   ANGIOPLASTY ILLIAC ARTERY Right 04/23/2021   Procedure: ANGIOPLASTY AND STENT SUPERFICIAL FEMORAL ARTERY;  Surgeon: Cherre Robins, MD;  Location: [facility];  Service: Vascular;  Laterality: Right;   AORTOGRAM  04/23/2021   Procedure: AORTOGRAM;  Surgeon: Cherre Robins, MD;  Location: [facility];  Service: Vascular;;   AORTOGRAM Left 04/30/2021   Procedure: left lower extremity angiogram with second order cannulation;  Surgeon: Cherre Robins, MD;  Location: [facility];  Service: Vascular;  Laterality: Left;   BIOPSY  11/12/2020   Procedure: BIOPSY;  Surgeon: Harvel Quale, MD;  [facility];  Service: Gastroenterology;;   BREAST BIOPSY     CARDIAC CATHETERIZATION N/A 07/28/2016    Procedure: Left Heart Cath and Coronary Angiography;  Surgeon: Jettie Booze, MD;  Location: [facility];  Service: Cardiovascular;  Laterality: N/A;   COLONOSCOPY N/A 05/10/2019   Procedure: COLONOSCOPY;  Surgeon: Danie Binder, MD;  [facility];  Service: Endoscopy;  Laterality: N/A;  Phenergan 12.5 mg IV in pre-op   DILATION AND CURETTAGE OF UTERUS     ESOPHAGOGASTRODUODENOSCOPY (EGD) WITH PROPOFOL N/A 11/12/2020   Procedure: ESOPHAGOGASTRODUODENOSCOPY (EGD) WITH PROPOFOL;  Surgeon: Montez Morita,  Quillian Quince, MD;  [facility];  Service: Gastroenterology;  Laterality: N/A;   I & D EXTREMITY Bilateral 09/22/2017   Procedure: BILATERAL DEBRIDEMENT LEG/FOOT ULCERS, APPLY VERAFLO WOUND VAC;  Surgeon: Newt Minion, MD;  Location: [facility];  Service: Orthopedics;  Laterality: Bilateral;   I & D EXTREMITY Right 10/11/2018   Procedure: IRRIGATION AND DEBRIDEMENT RIGHT HAND;  Surgeon: [name], MD;  Location: [facility];  Service: Orthopedics;  Laterality: Right;   I & D EXTREMITY Right 10/13/2018   Procedure: REPEAT IRRIGATION AND DEBRIDEMENT RIGHT HAND;  Surgeon: [name], MD;  Location: [facility];  Service: Orthopedics;  Laterality: Right;   I & D EXTREMITY Right 11/22/2018   Procedure: IRRIGATION AND DEBRIDEMENT AND PINNING RIGHT HAND;  Surgeon: [name], MD;  Location: [facility];  Service: Orthopedics;  Laterality: Right;   IR RADIOLOGIST EVAL & MGMT  07/05/2018   LOWER EXTREMITY ANGIOGRAM Bilateral 04/23/2021   Procedure: RIGHT  AND LEFT LOWER EXTREMITY ANGIOGRAM;  Surgeon: Cherre Robins, MD;  Location: [facility];  Service: Vascular;  Laterality: Bilateral;   POLYPECTOMY  05/10/2019   Procedure: POLYPECTOMY;  Surgeon: Danie Binder, MD;  [facility];  Service: Endoscopy;;   SKIN SPLIT GRAFT Bilateral 09/28/2017   Procedure: BILATERAL SPLIT THICKNESS SKIN GRAFT LEGS/FEET AND APPLY VAC;  Surgeon: Newt Minion, MD;  Location: [facility];  Service:  Orthopedics;  Laterality: Bilateral;   SKIN SPLIT GRAFT Right 11/22/2018   Procedure: SKIN GRAFT SPLIT THICKNESS;  Surgeon: [name], MD;  Location: [facility];  Service: Orthopedics;  Laterality: Right;    FAMILY HISTORY: Family History  Problem Relation Age of Onset   Hypertension Mother    Heart attack Mother    Hypertension Father        CABG    Hypertension Sister    Hypertension Brother    Hypertension Sister    Cancer Sister        breast    Arthritis Other    Cancer Other    Diabetes Other    Asthma Other    Hypertension Daughter    Hypertension Son     SOCIAL HISTORY: Social History   Socioeconomic History   Marital status: Widowed    Spouse name: Not on file   Number of children: 2   Years of education: 9   Highest education level: 9th grade  Occupational History   Occupation: Disabled  Tobacco Use   Smoking status: Some Days    Packs/day: 0.25    Years: 20.00    Pack years: 5.00    Types: Cigarettes    Last attempt to quit: 06/03/2017    Years since quitting: 4.4   Smokeless tobacco: Never  Vaping Use   Vaping Use: Never used  Substance and Sexual Activity   Alcohol use: Not Currently    Alcohol/week: 0.0 standard drinks    Comment: Quit in 2017   Drug use: Not Currently    Frequency: 3.0 times per week    Types: Marijuana, Cocaine    Comment: none since August 2018   Sexual activity: Not on file  Other Topics Concern   Not on file  Social History Narrative   Right handed   Drinks 1-2 cups caffeine daily   Social Determinants of Health   Financial Resource Strain: Not on file  Food Insecurity: Not on file  Transportation Needs: Not on file  Physical Activity: Not on file  Stress: Not on file  Social Connections: Not on file  Intimate Partner Violence: Not on file      PHYSICAL EXAM  Vitals:   11/04/21 1135  BP: 106/84  Pulse: (!) 110  Height: 5\' 1"  (1.549 m)   Body mass index is 21.75 kg/m.  Generalized: Well developed,  in no acute distress   Neurological examination  Mentation: Alert oriented to time, place, history taking. Follows all commands speech and language fluent Cranial nerve II-XII: Pupils were equal round reactive to light. Extraocular movements were full, visual field were full on confrontational test. Facial sensation and strength were normal. . Head turning and shoulder shrug  were normal and symmetric. Motor: The motor testing reveals 5 over 5 strength of all 4 extremities. Good symmetric motor tone is noted throughout.  Sensory: Sensory testing is intact to soft touch on all 4 extremities. No evidence of extinction is noted.  Coordination: Cerebellar testing reveals good finger-nose-finger and heel-to-shin bilaterally.  Gait and station: In wheelchair  Reflexes: Deep tendon reflexes are symmetric and normal bilaterally.   DIAGNOSTIC DATA (LABS, IMAGING, TESTING) - I reviewed patient records, labs, notes, testing and imaging myself where available.  Lab Results  Component Value Date   WBC 6.9 07/25/2021   HGB 8.5 (L) 07/25/2021   HCT 28.4 (L) 07/25/2021   MCV 93.1 07/25/2021   PLT 331 07/25/2021      Component Value Date/Time   NA 139 07/22/2021 0330   K 4.5 07/22/2021 0330   CL 112 (H) 07/22/2021 0330   CO2 22 07/22/2021 0330   GLUCOSE 125 (H) 07/22/2021 0330   BUN 22 (H) 07/22/2021 0330   CREATININE 1.12 (H) 07/22/2021 0330   CREATININE 1.56 (H) 07/26/2018 1513   CALCIUM 7.5 (L) 07/22/2021 0330   CALCIUM 8.9 09/13/2014 0703   PROT 4.9 (L) 07/20/2021 1459   ALBUMIN 1.5 (L) 07/20/2021 1459   AST 14 (L) 07/20/2021 1459   ALT 12 07/20/2021 1459   ALKPHOS 67 07/20/2021 1459   BILITOT 0.5 07/20/2021 1459   GFRNONAA 57 (L) 07/22/2021 0330   GFRNONAA 37 (L) 07/26/2018 1513   GFRAA 54 (L) 05/09/2019 0956   GFRAA 43 (L) 07/26/2018 1513   Lab Results  Component Value Date   CHOL 240 (H) 08/23/2020   HDL 34 (L) 08/23/2020   LDLCALC 180 (H) 08/23/2020   LDLDIRECT 122 (H)  01/08/2010   TRIG 261 (H) 12/03/2020   CHOLHDL 7.1 08/23/2020   Lab Results  Component Value Date   HGBA1C 6.1 (H) 07/01/2021   Lab Results  Component Value Date   VITAMINB12 696 05/06/2021   Lab Results  Component Value Date   TSH 0.991 03/22/2021      ASSESSMENT AND PLAN 56 y.o. year old female  has a past medical history of Alcohol use, Ankle fracture, lateral malleolus, closed (2013), Anxiety, Aortic atherosclerosis ([place]) (03/10/2021), Breast mass, left (2013), CHF (congestive heart failure) ([place]), Chronic anemia, CKD (chronic kidney disease), stage III ([place]), Cocaine abuse ([place]), COPD (chronic obstructive pulmonary disease) ([place]), Diabetes mellitus, type 2 ([place]), Diabetic Charcot foot ([place]), Essential hypertension, History of cardiomyopathy, History of GI bleed (2011), Hyperlipidemia, Noncompliance, Obesity, PAF (paroxysmal atrial fibrillation) ([place]), Panic attacks, PAT (paroxysmal atrial tachycardia) ([place]), Seizures ([place]) (10/01/2020), Sleep apnea, Stroke ([place]) (2018), Tobacco abuse, and Urinary incontinence. here with;  1.  Seizures  Continue Keppra 500 mg twice a day Patient does not wish to wean off of this medication or repeat EEG At the end of the visit the patient mentioned  some memory issues she was advised to discuss with PCP and if needed we can see her for full evaluation.     Ward Givens, MSN, NP-C 11/05/2021, 10:42 AM [facility] [address] [place], [place] [address] [phone]

## 2021-11-10 DIAGNOSIS — I739 Peripheral vascular disease, unspecified: Secondary | ICD-10-CM | POA: Diagnosis not present

## 2021-11-10 DIAGNOSIS — M25511 Pain in right shoulder: Secondary | ICD-10-CM | POA: Diagnosis not present

## 2021-11-10 DIAGNOSIS — K219 Gastro-esophageal reflux disease without esophagitis: Secondary | ICD-10-CM | POA: Diagnosis not present

## 2021-11-10 DIAGNOSIS — D638 Anemia in other chronic diseases classified elsewhere: Secondary | ICD-10-CM | POA: Diagnosis not present

## 2021-11-10 DIAGNOSIS — G4701 Insomnia due to medical condition: Secondary | ICD-10-CM | POA: Diagnosis not present

## 2021-11-10 DIAGNOSIS — I10 Essential (primary) hypertension: Secondary | ICD-10-CM | POA: Diagnosis not present

## 2021-11-10 DIAGNOSIS — I5030 Unspecified diastolic (congestive) heart failure: Secondary | ICD-10-CM | POA: Diagnosis not present

## 2021-11-10 DIAGNOSIS — E1151 Type 2 diabetes mellitus with diabetic peripheral angiopathy without gangrene: Secondary | ICD-10-CM | POA: Diagnosis not present

## 2021-11-10 DIAGNOSIS — I96 Gangrene, not elsewhere classified: Secondary | ICD-10-CM | POA: Diagnosis not present

## 2021-11-17 DIAGNOSIS — I639 Cerebral infarction, unspecified: Secondary | ICD-10-CM | POA: Diagnosis not present

## 2021-11-17 DIAGNOSIS — R269 Unspecified abnormalities of gait and mobility: Secondary | ICD-10-CM | POA: Diagnosis not present

## 2021-11-17 DIAGNOSIS — M6281 Muscle weakness (generalized): Secondary | ICD-10-CM | POA: Diagnosis not present

## 2021-11-17 DIAGNOSIS — J441 Chronic obstructive pulmonary disease with (acute) exacerbation: Secondary | ICD-10-CM | POA: Diagnosis not present

## 2021-11-20 DIAGNOSIS — G9340 Encephalopathy, unspecified: Secondary | ICD-10-CM | POA: Diagnosis not present

## 2021-11-20 DIAGNOSIS — K219 Gastro-esophageal reflux disease without esophagitis: Secondary | ICD-10-CM | POA: Diagnosis not present

## 2021-11-20 DIAGNOSIS — K269 Duodenal ulcer, unspecified as acute or chronic, without hemorrhage or perforation: Secondary | ICD-10-CM | POA: Diagnosis not present

## 2021-11-20 DIAGNOSIS — I10 Essential (primary) hypertension: Secondary | ICD-10-CM | POA: Diagnosis not present

## 2021-11-20 DIAGNOSIS — I96 Gangrene, not elsewhere classified: Secondary | ICD-10-CM | POA: Diagnosis not present

## 2021-11-20 DIAGNOSIS — E1151 Type 2 diabetes mellitus with diabetic peripheral angiopathy without gangrene: Secondary | ICD-10-CM | POA: Diagnosis not present

## 2021-11-20 DIAGNOSIS — M25511 Pain in right shoulder: Secondary | ICD-10-CM | POA: Diagnosis not present

## 2021-11-20 DIAGNOSIS — R627 Adult failure to thrive: Secondary | ICD-10-CM | POA: Diagnosis not present

## 2021-11-20 DIAGNOSIS — R300 Dysuria: Secondary | ICD-10-CM | POA: Diagnosis not present

## 2021-11-24 ENCOUNTER — Encounter: Payer: Self-pay | Admitting: Internal Medicine

## 2021-11-24 ENCOUNTER — Ambulatory Visit: Payer: Medicare Other | Admitting: Gastroenterology

## 2021-11-24 DIAGNOSIS — D638 Anemia in other chronic diseases classified elsewhere: Secondary | ICD-10-CM | POA: Diagnosis not present

## 2021-11-24 DIAGNOSIS — E1151 Type 2 diabetes mellitus with diabetic peripheral angiopathy without gangrene: Secondary | ICD-10-CM | POA: Diagnosis not present

## 2021-11-24 DIAGNOSIS — G4701 Insomnia due to medical condition: Secondary | ICD-10-CM | POA: Diagnosis not present

## 2021-11-24 DIAGNOSIS — I5030 Unspecified diastolic (congestive) heart failure: Secondary | ICD-10-CM | POA: Diagnosis not present

## 2021-11-24 DIAGNOSIS — I10 Essential (primary) hypertension: Secondary | ICD-10-CM | POA: Diagnosis not present

## 2021-11-24 DIAGNOSIS — I4891 Unspecified atrial fibrillation: Secondary | ICD-10-CM | POA: Diagnosis not present

## 2021-11-24 NOTE — Progress Notes (Deleted)
Primary Care Physician: [name], MD  Primary Gastroenterologist:  Elon Alas. Abbey Chatters, DO   No chief complaint on file.   HPI: [name] is a 56 y.o. female here for follow-up.  She was last seen in March 2022.  History of alcohol abuse, cocaine abuse, chronic kidney disease, diabetes, hypertension, A. fib, seizures, stroke, C. difficile.  Patient was seen during hospitalization in early 2022 for hematemesis.  Chronically using NSAIDs.  EGD showed LA grade C esophagitis, nonbleeding superficial duodenal ulcers (at least 6).  Biopsies from the stomach were benign, negative for H. pylori.  No plans for repeat EGD.  Current Outpatient Medications  Medication Sig Dispense Refill   acetaminophen (TYLENOL) 325 MG tablet Take 2 tablets (650 mg total) by mouth every 6 (six) hours as needed for headache, fever or mild pain. (Patient taking differently: Take 650 mg by mouth every 6 (six) hours as needed for fever (or pain).)     amLODipine (NORVASC) 10 MG tablet Take 1 tablet (10 mg total) by mouth daily. 30 tablet 5   aspirin EC 81 MG EC tablet Take 1 tablet (81 mg total) by mouth daily. Swallow whole. 30 tablet 11   benztropine (COGENTIN) 0.5 MG tablet Take 1 tablet (0.5 mg total) by mouth 2 (two) times daily. 60 tablet 0   ELIQUIS 5 MG TABS tablet Take 5 mg by mouth 2 (two) times daily.     feeding supplement (ENSURE ENLIVE / ENSURE PLUS) LIQD Take 237 mLs by mouth 3 (three) times daily between meals. 237 mL 12   FLUoxetine (PROZAC) 20 MG capsule Take 1 capsule (20 mg total) by mouth daily. 30 capsule 0   FLUoxetine (PROZAC) 40 MG capsule Take 40 mg by mouth daily.     HUMALOG KWIKPEN 100 UNIT/ML KwikPen Inject 1-10 Units into the skin See admin instructions. Inject 1-10 units into the skin before meals and at bedtime, PER SLIDING SCALE: BGL 151-200 = 1 unit; 201-250 = 2 units; 251-300 = 4 units; 301-350 = 6 units; 351-400 = 8 units; 401-450 = 10 units     levETIRAcetam  (KEPPRA) 500 MG tablet Take 1 tablet (500 mg total) by mouth 2 (two) times daily.     metoprolol succinate (TOPROL-XL) 25 MG 24 hr tablet Take 50 mg by mouth daily.     Multiple Vitamin (MULTIVITAMIN WITH MINERALS) TABS tablet Take 1 tablet by mouth daily.     omeprazole (PRILOSEC) 20 MG capsule Take 20 mg by mouth daily before breakfast.     oxybutynin (DITROPAN-XL) 5 MG 24 hr tablet Take 5 mg by mouth in the morning.     rosuvastatin (CRESTOR) 10 MG tablet Take 10 mg by mouth every evening.     sennosides-docusate sodium (SENOKOT-S) 8.6-50 MG tablet Take 1 tablet by mouth in the morning and at bedtime.     thiamine 100 MG tablet Take 1 tablet (100 mg total) by mouth daily.     tiotropium (SPIRIVA HANDIHALER) 18 MCG inhalation capsule Place 18 mcg into inhaler and inhale daily as needed (Shortness of breath).     zinc sulfate 220 (50 Zn) MG capsule Take 1 capsule (220 mg total) by mouth daily. 30 capsule 2   No current facility-administered medications for this visit.    Allergies as of 11/24/2021 - Review Complete 11/04/2021  Allergen Reaction Noted   Sodium hypochlorite Anaphylaxis 07/29/2021    ROS:  General: Negative for anorexia, weight loss, fever, chills, fatigue,  weakness. ENT: Negative for hoarseness, difficulty swallowing , nasal congestion. CV: Negative for chest pain, angina, palpitations, dyspnea on exertion, peripheral edema.  Respiratory: Negative for dyspnea at rest, dyspnea on exertion, cough, sputum, wheezing.  GI: See history of present illness. GU:  Negative for dysuria, hematuria, urinary incontinence, urinary frequency, nocturnal urination.  Endo: Negative for unusual weight change.    Physical Examination:   LMP 05/19/2016   General: Well-nourished, well-developed in no acute distress.  Eyes: No icterus. Mouth: Oropharyngeal mucosa moist and pink , no lesions erythema or exudate. Lungs: Clear to auscultation bilaterally.  Heart: Regular rate and rhythm, no  murmurs rubs or gallops.  Abdomen: Bowel sounds are normal, nontender, nondistended, no hepatosplenomegaly or masses, no abdominal bruits or hernia , no rebound or guarding.   Extremities: No lower extremity edema. No clubbing or deformities. Neuro: Alert and oriented x 4   Skin: Warm and dry, no jaundice.   Psych: Alert and cooperative, normal mood and affect.  Labs:  Lab Results  Component Value Date   CREATININE 1.12 (H) 07/22/2021   BUN 22 (H) 07/22/2021   NA 139 07/22/2021   K 4.5 07/22/2021   CL 112 (H) 07/22/2021   CO2 22 07/22/2021   Lab Results  Component Value Date   WBC 6.9 07/25/2021   HGB 8.5 (L) 07/25/2021   HCT 28.4 (L) 07/25/2021   MCV 93.1 07/25/2021   PLT 331 07/25/2021    Lab Results  Component Value Date   ALT 12 07/20/2021   AST 14 (L) 07/20/2021   ALKPHOS 67 07/20/2021   BILITOT 0.5 07/20/2021     Lab Results  Component Value Date   IRON 38 05/06/2021   TIBC 116 (L) 05/06/2021   FERRITIN 253 05/06/2021   Lab Results  Component Value Date   LGXQJJHE17 408 05/06/2021   Lab Results  Component Value Date   FOLATE 13.4 05/06/2021    Imaging Studies: No results found.   Assessment:     Plan:

## 2021-12-05 DIAGNOSIS — I10 Essential (primary) hypertension: Secondary | ICD-10-CM | POA: Diagnosis not present

## 2021-12-06 DIAGNOSIS — I739 Peripheral vascular disease, unspecified: Secondary | ICD-10-CM | POA: Diagnosis not present

## 2021-12-06 DIAGNOSIS — M6281 Muscle weakness (generalized): Secondary | ICD-10-CM | POA: Diagnosis not present

## 2021-12-06 DIAGNOSIS — I482 Chronic atrial fibrillation, unspecified: Secondary | ICD-10-CM | POA: Diagnosis not present

## 2021-12-06 DIAGNOSIS — R279 Unspecified lack of coordination: Secondary | ICD-10-CM | POA: Diagnosis not present

## 2021-12-06 DIAGNOSIS — R262 Difficulty in walking, not elsewhere classified: Secondary | ICD-10-CM | POA: Diagnosis not present

## 2021-12-06 DIAGNOSIS — I509 Heart failure, unspecified: Secondary | ICD-10-CM | POA: Diagnosis not present

## 2021-12-06 DIAGNOSIS — R2689 Other abnormalities of gait and mobility: Secondary | ICD-10-CM | POA: Diagnosis not present

## 2021-12-06 DIAGNOSIS — N182 Chronic kidney disease, stage 2 (mild): Secondary | ICD-10-CM | POA: Diagnosis not present

## 2021-12-08 DIAGNOSIS — Z794 Long term (current) use of insulin: Secondary | ICD-10-CM | POA: Diagnosis not present

## 2021-12-08 DIAGNOSIS — R627 Adult failure to thrive: Secondary | ICD-10-CM | POA: Diagnosis not present

## 2021-12-08 DIAGNOSIS — D638 Anemia in other chronic diseases classified elsewhere: Secondary | ICD-10-CM | POA: Diagnosis not present

## 2021-12-08 DIAGNOSIS — N390 Urinary tract infection, site not specified: Secondary | ICD-10-CM | POA: Diagnosis not present

## 2021-12-08 DIAGNOSIS — R5381 Other malaise: Secondary | ICD-10-CM | POA: Diagnosis not present

## 2021-12-08 DIAGNOSIS — I5030 Unspecified diastolic (congestive) heart failure: Secondary | ICD-10-CM | POA: Diagnosis not present

## 2021-12-08 DIAGNOSIS — R300 Dysuria: Secondary | ICD-10-CM | POA: Diagnosis not present

## 2021-12-08 DIAGNOSIS — G4701 Insomnia due to medical condition: Secondary | ICD-10-CM | POA: Diagnosis not present

## 2021-12-09 DIAGNOSIS — I482 Chronic atrial fibrillation, unspecified: Secondary | ICD-10-CM | POA: Diagnosis not present

## 2021-12-09 DIAGNOSIS — R2689 Other abnormalities of gait and mobility: Secondary | ICD-10-CM | POA: Diagnosis not present

## 2021-12-09 DIAGNOSIS — N182 Chronic kidney disease, stage 2 (mild): Secondary | ICD-10-CM | POA: Diagnosis not present

## 2021-12-09 DIAGNOSIS — I739 Peripheral vascular disease, unspecified: Secondary | ICD-10-CM | POA: Diagnosis not present

## 2021-12-09 DIAGNOSIS — R279 Unspecified lack of coordination: Secondary | ICD-10-CM | POA: Diagnosis not present

## 2021-12-09 DIAGNOSIS — R262 Difficulty in walking, not elsewhere classified: Secondary | ICD-10-CM | POA: Diagnosis not present

## 2021-12-09 DIAGNOSIS — M6281 Muscle weakness (generalized): Secondary | ICD-10-CM | POA: Diagnosis not present

## 2021-12-09 DIAGNOSIS — I509 Heart failure, unspecified: Secondary | ICD-10-CM | POA: Diagnosis not present

## 2021-12-10 ENCOUNTER — Non-Acute Institutional Stay: Payer: Medicare Other | Admitting: Internal Medicine

## 2021-12-10 ENCOUNTER — Other Ambulatory Visit: Payer: Self-pay

## 2021-12-10 DIAGNOSIS — E11622 Type 2 diabetes mellitus with other skin ulcer: Secondary | ICD-10-CM | POA: Diagnosis not present

## 2021-12-10 DIAGNOSIS — Z515 Encounter for palliative care: Secondary | ICD-10-CM

## 2021-12-10 DIAGNOSIS — E43 Unspecified severe protein-calorie malnutrition: Secondary | ICD-10-CM | POA: Diagnosis not present

## 2021-12-10 DIAGNOSIS — I482 Chronic atrial fibrillation, unspecified: Secondary | ICD-10-CM | POA: Diagnosis not present

## 2021-12-10 DIAGNOSIS — I739 Peripheral vascular disease, unspecified: Secondary | ICD-10-CM | POA: Diagnosis not present

## 2021-12-10 DIAGNOSIS — M6281 Muscle weakness (generalized): Secondary | ICD-10-CM | POA: Diagnosis not present

## 2021-12-10 DIAGNOSIS — R2689 Other abnormalities of gait and mobility: Secondary | ICD-10-CM | POA: Diagnosis not present

## 2021-12-10 DIAGNOSIS — L97309 Non-pressure chronic ulcer of unspecified ankle with unspecified severity: Secondary | ICD-10-CM | POA: Diagnosis not present

## 2021-12-10 DIAGNOSIS — I509 Heart failure, unspecified: Secondary | ICD-10-CM | POA: Diagnosis not present

## 2021-12-10 DIAGNOSIS — N182 Chronic kidney disease, stage 2 (mild): Secondary | ICD-10-CM | POA: Diagnosis not present

## 2021-12-10 DIAGNOSIS — R262 Difficulty in walking, not elsewhere classified: Secondary | ICD-10-CM | POA: Diagnosis not present

## 2021-12-10 DIAGNOSIS — R279 Unspecified lack of coordination: Secondary | ICD-10-CM | POA: Diagnosis not present

## 2021-12-11 DIAGNOSIS — I482 Chronic atrial fibrillation, unspecified: Secondary | ICD-10-CM | POA: Diagnosis not present

## 2021-12-11 DIAGNOSIS — I509 Heart failure, unspecified: Secondary | ICD-10-CM | POA: Diagnosis not present

## 2021-12-11 DIAGNOSIS — R279 Unspecified lack of coordination: Secondary | ICD-10-CM | POA: Diagnosis not present

## 2021-12-11 DIAGNOSIS — R262 Difficulty in walking, not elsewhere classified: Secondary | ICD-10-CM | POA: Diagnosis not present

## 2021-12-11 DIAGNOSIS — M6281 Muscle weakness (generalized): Secondary | ICD-10-CM | POA: Diagnosis not present

## 2021-12-11 DIAGNOSIS — N182 Chronic kidney disease, stage 2 (mild): Secondary | ICD-10-CM | POA: Diagnosis not present

## 2021-12-11 DIAGNOSIS — I739 Peripheral vascular disease, unspecified: Secondary | ICD-10-CM | POA: Diagnosis not present

## 2021-12-11 DIAGNOSIS — R2689 Other abnormalities of gait and mobility: Secondary | ICD-10-CM | POA: Diagnosis not present

## 2021-12-12 DIAGNOSIS — I482 Chronic atrial fibrillation, unspecified: Secondary | ICD-10-CM | POA: Diagnosis not present

## 2021-12-12 DIAGNOSIS — R2689 Other abnormalities of gait and mobility: Secondary | ICD-10-CM | POA: Diagnosis not present

## 2021-12-12 DIAGNOSIS — I739 Peripheral vascular disease, unspecified: Secondary | ICD-10-CM | POA: Diagnosis not present

## 2021-12-12 DIAGNOSIS — I10 Essential (primary) hypertension: Secondary | ICD-10-CM | POA: Diagnosis not present

## 2021-12-12 DIAGNOSIS — N182 Chronic kidney disease, stage 2 (mild): Secondary | ICD-10-CM | POA: Diagnosis not present

## 2021-12-12 DIAGNOSIS — M6281 Muscle weakness (generalized): Secondary | ICD-10-CM | POA: Diagnosis not present

## 2021-12-12 DIAGNOSIS — D638 Anemia in other chronic diseases classified elsewhere: Secondary | ICD-10-CM | POA: Diagnosis not present

## 2021-12-12 DIAGNOSIS — I5030 Unspecified diastolic (congestive) heart failure: Secondary | ICD-10-CM | POA: Diagnosis not present

## 2021-12-12 DIAGNOSIS — E1151 Type 2 diabetes mellitus with diabetic peripheral angiopathy without gangrene: Secondary | ICD-10-CM | POA: Diagnosis not present

## 2021-12-12 DIAGNOSIS — I4891 Unspecified atrial fibrillation: Secondary | ICD-10-CM | POA: Diagnosis not present

## 2021-12-12 DIAGNOSIS — I509 Heart failure, unspecified: Secondary | ICD-10-CM | POA: Diagnosis not present

## 2021-12-12 DIAGNOSIS — R262 Difficulty in walking, not elsewhere classified: Secondary | ICD-10-CM | POA: Diagnosis not present

## 2021-12-12 DIAGNOSIS — R279 Unspecified lack of coordination: Secondary | ICD-10-CM | POA: Diagnosis not present

## 2021-12-15 DIAGNOSIS — R262 Difficulty in walking, not elsewhere classified: Secondary | ICD-10-CM | POA: Diagnosis not present

## 2021-12-15 DIAGNOSIS — I739 Peripheral vascular disease, unspecified: Secondary | ICD-10-CM | POA: Diagnosis not present

## 2021-12-15 DIAGNOSIS — M6281 Muscle weakness (generalized): Secondary | ICD-10-CM | POA: Diagnosis not present

## 2021-12-15 DIAGNOSIS — R2689 Other abnormalities of gait and mobility: Secondary | ICD-10-CM | POA: Diagnosis not present

## 2021-12-15 DIAGNOSIS — I509 Heart failure, unspecified: Secondary | ICD-10-CM | POA: Diagnosis not present

## 2021-12-15 DIAGNOSIS — I482 Chronic atrial fibrillation, unspecified: Secondary | ICD-10-CM | POA: Diagnosis not present

## 2021-12-15 DIAGNOSIS — N182 Chronic kidney disease, stage 2 (mild): Secondary | ICD-10-CM | POA: Diagnosis not present

## 2021-12-15 DIAGNOSIS — R279 Unspecified lack of coordination: Secondary | ICD-10-CM | POA: Diagnosis not present
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
## 2021-12-16 DIAGNOSIS — R2689 Other abnormalities of gait and mobility: Secondary | ICD-10-CM | POA: Diagnosis not present

## 2021-12-16 DIAGNOSIS — M6281 Muscle weakness (generalized): Secondary | ICD-10-CM | POA: Diagnosis not present

## 2021-12-16 DIAGNOSIS — I739 Peripheral vascular disease, unspecified: Secondary | ICD-10-CM | POA: Diagnosis not present

## 2021-12-16 DIAGNOSIS — I509 Heart failure, unspecified: Secondary | ICD-10-CM | POA: Diagnosis not present

## 2021-12-16 DIAGNOSIS — N182 Chronic kidney disease, stage 2 (mild): Secondary | ICD-10-CM | POA: Diagnosis not present

## 2021-12-16 DIAGNOSIS — R262 Difficulty in walking, not elsewhere classified: Secondary | ICD-10-CM | POA: Diagnosis not present

## 2021-12-16 DIAGNOSIS — R279 Unspecified lack of coordination: Secondary | ICD-10-CM | POA: Diagnosis not present

## 2021-12-16 DIAGNOSIS — I482 Chronic atrial fibrillation, unspecified: Secondary | ICD-10-CM | POA: Diagnosis not present

## 2021-12-17 DIAGNOSIS — I739 Peripheral vascular disease, unspecified: Secondary | ICD-10-CM | POA: Diagnosis not present

## 2021-12-17 DIAGNOSIS — I509 Heart failure, unspecified: Secondary | ICD-10-CM | POA: Diagnosis not present

## 2021-12-17 DIAGNOSIS — N182 Chronic kidney disease, stage 2 (mild): Secondary | ICD-10-CM | POA: Diagnosis not present

## 2021-12-17 DIAGNOSIS — R279 Unspecified lack of coordination: Secondary | ICD-10-CM | POA: Diagnosis not present

## 2021-12-17 DIAGNOSIS — I482 Chronic atrial fibrillation, unspecified: Secondary | ICD-10-CM | POA: Diagnosis not present

## 2021-12-17 DIAGNOSIS — M6281 Muscle weakness (generalized): Secondary | ICD-10-CM | POA: Diagnosis not present

## 2021-12-17 DIAGNOSIS — R262 Difficulty in walking, not elsewhere classified: Secondary | ICD-10-CM | POA: Diagnosis not present

## 2021-12-17 DIAGNOSIS — R2689 Other abnormalities of gait and mobility: Secondary | ICD-10-CM | POA: Diagnosis not present

## 2021-12-18 DIAGNOSIS — I739 Peripheral vascular disease, unspecified: Secondary | ICD-10-CM | POA: Diagnosis not present

## 2021-12-18 DIAGNOSIS — R279 Unspecified lack of coordination: Secondary | ICD-10-CM | POA: Diagnosis not present

## 2021-12-18 DIAGNOSIS — E1151 Type 2 diabetes mellitus with diabetic peripheral angiopathy without gangrene: Secondary | ICD-10-CM | POA: Diagnosis not present

## 2021-12-18 DIAGNOSIS — I482 Chronic atrial fibrillation, unspecified: Secondary | ICD-10-CM | POA: Diagnosis not present

## 2021-12-18 DIAGNOSIS — D638 Anemia in other chronic diseases classified elsewhere: Secondary | ICD-10-CM | POA: Diagnosis not present

## 2021-12-18 DIAGNOSIS — M6281 Muscle weakness (generalized): Secondary | ICD-10-CM | POA: Diagnosis not present

## 2021-12-18 DIAGNOSIS — R627 Adult failure to thrive: Secondary | ICD-10-CM | POA: Diagnosis not present

## 2021-12-18 DIAGNOSIS — I4891 Unspecified atrial fibrillation: Secondary | ICD-10-CM | POA: Diagnosis not present

## 2021-12-18 DIAGNOSIS — Z794 Long term (current) use of insulin: Secondary | ICD-10-CM | POA: Diagnosis not present

## 2021-12-18 DIAGNOSIS — R2689 Other abnormalities of gait and mobility: Secondary | ICD-10-CM | POA: Diagnosis not present

## 2021-12-18 DIAGNOSIS — K219 Gastro-esophageal reflux disease without esophagitis: Secondary | ICD-10-CM | POA: Diagnosis not present

## 2021-12-18 DIAGNOSIS — N182 Chronic kidney disease, stage 2 (mild): Secondary | ICD-10-CM | POA: Diagnosis not present

## 2021-12-18 DIAGNOSIS — R262 Difficulty in walking, not elsewhere classified: Secondary | ICD-10-CM | POA: Diagnosis not present

## 2021-12-18 DIAGNOSIS — I10 Essential (primary) hypertension: Secondary | ICD-10-CM | POA: Diagnosis not present

## 2021-12-18 DIAGNOSIS — I509 Heart failure, unspecified: Secondary | ICD-10-CM | POA: Diagnosis not present

## 2021-12-18 DIAGNOSIS — R5381 Other malaise: Secondary | ICD-10-CM | POA: Diagnosis not present

## 2021-12-18 DIAGNOSIS — I5030 Unspecified diastolic (congestive) heart failure: Secondary | ICD-10-CM | POA: Diagnosis not present

## 2021-12-19 DIAGNOSIS — R2689 Other abnormalities of gait and mobility: Secondary | ICD-10-CM | POA: Diagnosis not present

## 2021-12-19 DIAGNOSIS — M6281 Muscle weakness (generalized): Secondary | ICD-10-CM | POA: Diagnosis not present

## 2021-12-19 DIAGNOSIS — N182 Chronic kidney disease, stage 2 (mild): Secondary | ICD-10-CM | POA: Diagnosis not present

## 2021-12-19 DIAGNOSIS — I739 Peripheral vascular disease, unspecified: Secondary | ICD-10-CM | POA: Diagnosis not present

## 2021-12-19 DIAGNOSIS — R279 Unspecified lack of coordination: Secondary | ICD-10-CM | POA: Diagnosis not present

## 2021-12-19 DIAGNOSIS — I509 Heart failure, unspecified: Secondary | ICD-10-CM | POA: Diagnosis not present

## 2021-12-19 DIAGNOSIS — R262 Difficulty in walking, not elsewhere classified: Secondary | ICD-10-CM | POA: Diagnosis not present

## 2021-12-19 DIAGNOSIS — I482 Chronic atrial fibrillation, unspecified: Secondary | ICD-10-CM | POA: Diagnosis not present

## 2021-12-21 DIAGNOSIS — I739 Peripheral vascular disease, unspecified: Secondary | ICD-10-CM | POA: Diagnosis not present

## 2021-12-21 DIAGNOSIS — I482 Chronic atrial fibrillation, unspecified: Secondary | ICD-10-CM | POA: Diagnosis not present

## 2021-12-21 DIAGNOSIS — R262 Difficulty in walking, not elsewhere classified: Secondary | ICD-10-CM | POA: Diagnosis not present

## 2021-12-21 DIAGNOSIS — R2689 Other abnormalities of gait and mobility: Secondary | ICD-10-CM | POA: Diagnosis not present

## 2021-12-21 DIAGNOSIS — N182 Chronic kidney disease, stage 2 (mild): Secondary | ICD-10-CM | POA: Diagnosis not present

## 2021-12-21 DIAGNOSIS — M6281 Muscle weakness (generalized): Secondary | ICD-10-CM | POA: Diagnosis not present

## 2021-12-21 DIAGNOSIS — I509 Heart failure, unspecified: Secondary | ICD-10-CM | POA: Diagnosis not present

## 2021-12-21 DIAGNOSIS — R279 Unspecified lack of coordination: Secondary | ICD-10-CM | POA: Diagnosis not present

## 2021-12-22 DIAGNOSIS — M6281 Muscle weakness (generalized): Secondary | ICD-10-CM | POA: Diagnosis not present

## 2021-12-22 DIAGNOSIS — N182 Chronic kidney disease, stage 2 (mild): Secondary | ICD-10-CM | POA: Diagnosis not present

## 2021-12-22 DIAGNOSIS — I482 Chronic atrial fibrillation, unspecified: Secondary | ICD-10-CM | POA: Diagnosis not present

## 2021-12-22 DIAGNOSIS — R262 Difficulty in walking, not elsewhere classified: Secondary | ICD-10-CM | POA: Diagnosis not present

## 2021-12-22 DIAGNOSIS — R279 Unspecified lack of coordination: Secondary | ICD-10-CM | POA: Diagnosis not present

## 2021-12-22 DIAGNOSIS — I739 Peripheral vascular disease, unspecified: Secondary | ICD-10-CM | POA: Diagnosis not present

## 2021-12-22 DIAGNOSIS — I509 Heart failure, unspecified: Secondary | ICD-10-CM | POA: Diagnosis not present

## 2021-12-22 DIAGNOSIS — R2689 Other abnormalities of gait and mobility: Secondary | ICD-10-CM | POA: Diagnosis not present

## 2021-12-23 DIAGNOSIS — R279 Unspecified lack of coordination: Secondary | ICD-10-CM | POA: Diagnosis not present

## 2021-12-23 DIAGNOSIS — I509 Heart failure, unspecified: Secondary | ICD-10-CM | POA: Diagnosis not present

## 2021-12-23 DIAGNOSIS — I482 Chronic atrial fibrillation, unspecified: Secondary | ICD-10-CM | POA: Diagnosis not present

## 2021-12-23 DIAGNOSIS — R262 Difficulty in walking, not elsewhere classified: Secondary | ICD-10-CM | POA: Diagnosis not present

## 2021-12-23 DIAGNOSIS — N182 Chronic kidney disease, stage 2 (mild): Secondary | ICD-10-CM | POA: Diagnosis not present

## 2021-12-23 DIAGNOSIS — M6281 Muscle weakness (generalized): Secondary | ICD-10-CM | POA: Diagnosis not present

## 2021-12-23 DIAGNOSIS — I739 Peripheral vascular disease, unspecified: Secondary | ICD-10-CM | POA: Diagnosis not present

## 2021-12-23 DIAGNOSIS — R2689 Other abnormalities of gait and mobility: Secondary | ICD-10-CM | POA: Diagnosis not present

## 2021-12-24 DIAGNOSIS — I509 Heart failure, unspecified: Secondary | ICD-10-CM | POA: Diagnosis not present

## 2021-12-24 DIAGNOSIS — I482 Chronic atrial fibrillation, unspecified: Secondary | ICD-10-CM | POA: Diagnosis not present

## 2021-12-24 DIAGNOSIS — R2689 Other abnormalities of gait and mobility: Secondary | ICD-10-CM | POA: Diagnosis not present

## 2021-12-24 DIAGNOSIS — M6281 Muscle weakness (generalized): Secondary | ICD-10-CM | POA: Diagnosis not present

## 2021-12-24 DIAGNOSIS — R262 Difficulty in walking, not elsewhere classified: Secondary | ICD-10-CM | POA: Diagnosis not present

## 2021-12-24 DIAGNOSIS — R279 Unspecified lack of coordination: Secondary | ICD-10-CM | POA: Diagnosis not present

## 2021-12-24 DIAGNOSIS — I739 Peripheral vascular disease, unspecified: Secondary | ICD-10-CM | POA: Diagnosis not present

## 2021-12-24 DIAGNOSIS — N182 Chronic kidney disease, stage 2 (mild): Secondary | ICD-10-CM | POA: Diagnosis not present

## 2021-12-25 DIAGNOSIS — I509 Heart failure, unspecified: Secondary | ICD-10-CM | POA: Diagnosis not present

## 2021-12-25 DIAGNOSIS — R279 Unspecified lack of coordination: Secondary | ICD-10-CM | POA: Diagnosis not present

## 2021-12-25 DIAGNOSIS — N182 Chronic kidney disease, stage 2 (mild): Secondary | ICD-10-CM | POA: Diagnosis not present

## 2021-12-25 DIAGNOSIS — R262 Difficulty in walking, not elsewhere classified: Secondary | ICD-10-CM | POA: Diagnosis not present

## 2021-12-25 DIAGNOSIS — R2689 Other abnormalities of gait and mobility: Secondary | ICD-10-CM | POA: Diagnosis not present

## 2021-12-25 DIAGNOSIS — I739 Peripheral vascular disease, unspecified: Secondary | ICD-10-CM | POA: Diagnosis not present

## 2021-12-25 DIAGNOSIS — M6281 Muscle weakness (generalized): Secondary | ICD-10-CM | POA: Diagnosis not present

## 2021-12-25 DIAGNOSIS — I482 Chronic atrial fibrillation, unspecified: Secondary | ICD-10-CM | POA: Diagnosis not present

## 2021-12-26 DIAGNOSIS — R2689 Other abnormalities of gait and mobility: Secondary | ICD-10-CM | POA: Diagnosis not present

## 2021-12-26 DIAGNOSIS — M6281 Muscle weakness (generalized): Secondary | ICD-10-CM | POA: Diagnosis not present

## 2021-12-26 DIAGNOSIS — N182 Chronic kidney disease, stage 2 (mild): Secondary | ICD-10-CM | POA: Diagnosis not present

## 2021-12-26 DIAGNOSIS — I509 Heart failure, unspecified: Secondary | ICD-10-CM | POA: Diagnosis not present

## 2021-12-26 DIAGNOSIS — R262 Difficulty in walking, not elsewhere classified: Secondary | ICD-10-CM | POA: Diagnosis not present

## 2021-12-26 DIAGNOSIS — R279 Unspecified lack of coordination: Secondary | ICD-10-CM | POA: Diagnosis not present

## 2021-12-26 DIAGNOSIS — I482 Chronic atrial fibrillation, unspecified: Secondary | ICD-10-CM | POA: Diagnosis not present

## 2021-12-26 DIAGNOSIS — I739 Peripheral vascular disease, unspecified: Secondary | ICD-10-CM | POA: Diagnosis not present

## 2021-12-29 DIAGNOSIS — R279 Unspecified lack of coordination: Secondary | ICD-10-CM | POA: Diagnosis not present

## 2021-12-29 DIAGNOSIS — I509 Heart failure, unspecified: Secondary | ICD-10-CM | POA: Diagnosis not present

## 2021-12-29 DIAGNOSIS — I739 Peripheral vascular disease, unspecified: Secondary | ICD-10-CM | POA: Diagnosis not present

## 2021-12-29 DIAGNOSIS — R2689 Other abnormalities of gait and mobility: Secondary | ICD-10-CM | POA: Diagnosis not present

## 2021-12-29 DIAGNOSIS — M6281 Muscle weakness (generalized): Secondary | ICD-10-CM | POA: Diagnosis not present

## 2021-12-29 DIAGNOSIS — N182 Chronic kidney disease, stage 2 (mild): Secondary | ICD-10-CM | POA: Diagnosis not present

## 2021-12-29 DIAGNOSIS — I482 Chronic atrial fibrillation, unspecified: Secondary | ICD-10-CM | POA: Diagnosis not present

## 2021-12-29 DIAGNOSIS — R262 Difficulty in walking, not elsewhere classified: Secondary | ICD-10-CM | POA: Diagnosis not present

## 2022-01-02 DIAGNOSIS — R262 Difficulty in walking, not elsewhere classified: Secondary | ICD-10-CM | POA: Diagnosis not present

## 2022-01-02 DIAGNOSIS — I482 Chronic atrial fibrillation, unspecified: Secondary | ICD-10-CM | POA: Diagnosis not present

## 2022-01-02 DIAGNOSIS — I739 Peripheral vascular disease, unspecified: Secondary | ICD-10-CM | POA: Diagnosis not present

## 2022-01-02 DIAGNOSIS — R2689 Other abnormalities of gait and mobility: Secondary | ICD-10-CM | POA: Diagnosis not present

## 2022-01-02 DIAGNOSIS — N182 Chronic kidney disease, stage 2 (mild): Secondary | ICD-10-CM | POA: Diagnosis not present

## 2022-01-02 DIAGNOSIS — I509 Heart failure, unspecified: Secondary | ICD-10-CM | POA: Diagnosis not present

## 2022-01-02 DIAGNOSIS — R279 Unspecified lack of coordination: Secondary | ICD-10-CM | POA: Diagnosis not present

## 2022-01-02 DIAGNOSIS — M6281 Muscle weakness (generalized): Secondary | ICD-10-CM | POA: Diagnosis not present

## 2022-01-05 DIAGNOSIS — H538 Other visual disturbances: Secondary | ICD-10-CM | POA: Diagnosis not present

## 2022-01-05 DIAGNOSIS — I10 Essential (primary) hypertension: Secondary | ICD-10-CM | POA: Diagnosis not present

## 2022-01-05 DIAGNOSIS — B351 Tinea unguium: Secondary | ICD-10-CM | POA: Diagnosis not present

## 2022-01-05 DIAGNOSIS — S81809A Unspecified open wound, unspecified lower leg, initial encounter: Secondary | ICD-10-CM | POA: Diagnosis not present

## 2022-01-05 DIAGNOSIS — I4891 Unspecified atrial fibrillation: Secondary | ICD-10-CM | POA: Diagnosis not present

## 2022-01-05 DIAGNOSIS — E1151 Type 2 diabetes mellitus with diabetic peripheral angiopathy without gangrene: Secondary | ICD-10-CM | POA: Diagnosis not present

## 2022-01-05 DIAGNOSIS — G4701 Insomnia due to medical condition: Secondary | ICD-10-CM | POA: Diagnosis not present

## 2022-01-05 DIAGNOSIS — I739 Peripheral vascular disease, unspecified: Secondary | ICD-10-CM | POA: Diagnosis not present

## 2022-01-05 DIAGNOSIS — D638 Anemia in other chronic diseases classified elsewhere: Secondary | ICD-10-CM | POA: Diagnosis not present

## 2022-01-15 DIAGNOSIS — I4891 Unspecified atrial fibrillation: Secondary | ICD-10-CM | POA: Diagnosis not present

## 2022-01-15 DIAGNOSIS — I10 Essential (primary) hypertension: Secondary | ICD-10-CM | POA: Diagnosis not present

## 2022-01-15 DIAGNOSIS — I5030 Unspecified diastolic (congestive) heart failure: Secondary | ICD-10-CM | POA: Diagnosis not present

## 2022-01-15 DIAGNOSIS — D638 Anemia in other chronic diseases classified elsewhere: Secondary | ICD-10-CM | POA: Diagnosis not present

## 2022-01-15 DIAGNOSIS — E1151 Type 2 diabetes mellitus with diabetic peripheral angiopathy without gangrene: Secondary | ICD-10-CM | POA: Diagnosis not present

## 2022-01-20 ENCOUNTER — Ambulatory Visit (INDEPENDENT_AMBULATORY_CARE_PROVIDER_SITE_OTHER): Payer: Medicare Other | Admitting: Podiatry

## 2022-01-20 ENCOUNTER — Other Ambulatory Visit: Payer: Self-pay

## 2022-01-20 DIAGNOSIS — B351 Tinea unguium: Secondary | ICD-10-CM | POA: Diagnosis not present

## 2022-01-20 DIAGNOSIS — L853 Xerosis cutis: Secondary | ICD-10-CM

## 2022-01-20 DIAGNOSIS — M79675 Pain in left toe(s): Secondary | ICD-10-CM

## 2022-01-20 DIAGNOSIS — E1159 Type 2 diabetes mellitus with other circulatory complications: Secondary | ICD-10-CM

## 2022-01-20 DIAGNOSIS — M79674 Pain in right toe(s): Secondary | ICD-10-CM

## 2022-01-20 MED ORDER — AMMONIUM LACTATE 12 % EX LOTN: 1.0000 "application " | TOPICAL_LOTION | CUTANEOUS | 4 refills | Status: DC | PRN

## 2022-01-22 DIAGNOSIS — K269 Duodenal ulcer, unspecified as acute or chronic, without hemorrhage or perforation: Secondary | ICD-10-CM | POA: Diagnosis not present

## 2022-01-22 DIAGNOSIS — E1151 Type 2 diabetes mellitus with diabetic peripheral angiopathy without gangrene: Secondary | ICD-10-CM | POA: Diagnosis not present

## 2022-01-22 DIAGNOSIS — K219 Gastro-esophageal reflux disease without esophagitis: Secondary | ICD-10-CM | POA: Diagnosis not present

## 2022-01-22 DIAGNOSIS — D638 Anemia in other chronic diseases classified elsewhere: Secondary | ICD-10-CM | POA: Diagnosis not present

## 2022-01-22 DIAGNOSIS — I5030 Unspecified diastolic (congestive) heart failure: Secondary | ICD-10-CM | POA: Diagnosis not present

## 2022-01-22 DIAGNOSIS — I739 Peripheral vascular disease, unspecified: Secondary | ICD-10-CM | POA: Diagnosis not present

## 2022-01-22 DIAGNOSIS — I10 Essential (primary) hypertension: Secondary | ICD-10-CM | POA: Diagnosis not present

## 2022-01-22 DIAGNOSIS — I4891 Unspecified atrial fibrillation: Secondary | ICD-10-CM | POA: Diagnosis not present

## 2022-01-23 NOTE — Progress Notes (Signed)
?  Subjective:  ?Patient ID: [name], female    DOB: 1966/03/20,  MRN: [id number] ? ?Chief Complaint  ?Patient presents with  ? Diabetes  ?  Foot exam, A1C  6.1  ? Nail Problem  ?  NP  thick painful toenails  ? ? ?56 y.o. female presents with the above complaint. History confirmed with patient. She has very dry skin she lives in assisted living facility ? ?Objective:  ?Physical Exam: ?warm, good capillary refill, no trophic changes or ulcerative lesions, normal DP and PT pulses, normal monofilament exam, normal sensory exam, and xerosis cutis. ?Left Foot: dystrophic yellowed discolored nail plates with subungual debris ?Right Foot: dystrophic yellowed discolored nail plates with subungual debris ? ?Assessment:  ? ?1. Type 2 diabetes mellitus with vascular disease (Stewart)   ?2. Pain due to onychomycosis of toenails of both feet   ?3. Xerosis cutis   ? ? ? ?Plan:  ?Patient was evaluated and treated and all questions answered. ? ?Patient educated on diabetes. Discussed proper diabetic foot care and discussed risks and complications of disease. Educated patient in depth on reasons to return to the office immediately should he/she discover anything concerning or new on the feet. All questions answered. Discussed proper shoes as well.  ? ?Discussed the etiology and treatment options for the condition in detail with the patient. Educated patient on the topical and oral treatment options for mycotic nails. Recommended debridement of the nails today. Sharp and mechanical debridement performed of all painful and mycotic nails today. Nails debrided in length and thickness using a nail nipper to level of comfort. Discussed treatment options including appropriate shoe gear. Follow up as needed for painful nails. ? ?Xerosis cutis prescribed ammonium lactate lotion for ? ?Return in about 3 months (around 04/22/2022) for at risk diabetic foot care.  ? ?

## 2022-02-02 DIAGNOSIS — K219 Gastro-esophageal reflux disease without esophagitis: Secondary | ICD-10-CM | POA: Diagnosis not present

## 2022-02-02 DIAGNOSIS — B351 Tinea unguium: Secondary | ICD-10-CM | POA: Diagnosis not present

## 2022-02-02 DIAGNOSIS — R300 Dysuria: Secondary | ICD-10-CM | POA: Diagnosis not present

## 2022-02-02 DIAGNOSIS — I739 Peripheral vascular disease, unspecified: Secondary | ICD-10-CM | POA: Diagnosis not present

## 2022-02-02 DIAGNOSIS — R627 Adult failure to thrive: Secondary | ICD-10-CM | POA: Diagnosis not present

## 2022-02-02 DIAGNOSIS — E1151 Type 2 diabetes mellitus with diabetic peripheral angiopathy without gangrene: Secondary | ICD-10-CM | POA: Diagnosis not present

## 2022-02-02 DIAGNOSIS — I5030 Unspecified diastolic (congestive) heart failure: Secondary | ICD-10-CM | POA: Diagnosis not present

## 2022-02-02 DIAGNOSIS — I4891 Unspecified atrial fibrillation: Secondary | ICD-10-CM | POA: Diagnosis not present

## 2022-02-02 DIAGNOSIS — H538 Other visual disturbances: Secondary | ICD-10-CM | POA: Diagnosis not present

## 2022-02-10 DIAGNOSIS — I10 Essential (primary) hypertension: Secondary | ICD-10-CM | POA: Diagnosis not present

## 2022-02-10 DIAGNOSIS — I130 Hypertensive heart and chronic kidney disease with heart failure and stage 1 through stage 4 chronic kidney disease, or unspecified chronic kidney disease: Secondary | ICD-10-CM | POA: Diagnosis not present

## 2022-02-10 DIAGNOSIS — E119 Type 2 diabetes mellitus without complications: Secondary | ICD-10-CM | POA: Diagnosis not present

## 2022-02-10 DIAGNOSIS — D638 Anemia in other chronic diseases classified elsewhere: Secondary | ICD-10-CM | POA: Diagnosis not present

## 2022-02-10 DIAGNOSIS — I4891 Unspecified atrial fibrillation: Secondary | ICD-10-CM | POA: Diagnosis not present

## 2022-02-10 DIAGNOSIS — J449 Chronic obstructive pulmonary disease, unspecified: Secondary | ICD-10-CM | POA: Diagnosis not present

## 2022-02-10 DIAGNOSIS — I5030 Unspecified diastolic (congestive) heart failure: Secondary | ICD-10-CM | POA: Diagnosis not present

## 2022-02-10 DIAGNOSIS — E1151 Type 2 diabetes mellitus with diabetic peripheral angiopathy without gangrene: Secondary | ICD-10-CM | POA: Diagnosis not present

## 2022-02-13 DIAGNOSIS — R262 Difficulty in walking, not elsewhere classified: Secondary | ICD-10-CM | POA: Diagnosis not present

## 2022-02-13 DIAGNOSIS — M6281 Muscle weakness (generalized): Secondary | ICD-10-CM | POA: Diagnosis not present

## 2022-02-13 DIAGNOSIS — I509 Heart failure, unspecified: Secondary | ICD-10-CM | POA: Diagnosis not present

## 2022-02-13 DIAGNOSIS — R2689 Other abnormalities of gait and mobility: Secondary | ICD-10-CM | POA: Diagnosis not present

## 2022-02-18 DIAGNOSIS — R2689 Other abnormalities of gait and mobility: Secondary | ICD-10-CM | POA: Diagnosis not present

## 2022-02-18 DIAGNOSIS — I509 Heart failure, unspecified: Secondary | ICD-10-CM | POA: Diagnosis not present

## 2022-02-18 DIAGNOSIS — M6281 Muscle weakness (generalized): Secondary | ICD-10-CM | POA: Diagnosis not present

## 2022-02-18 DIAGNOSIS — R262 Difficulty in walking, not elsewhere classified: Secondary | ICD-10-CM | POA: Diagnosis not present

## 2022-02-19 DIAGNOSIS — R262 Difficulty in walking, not elsewhere classified: Secondary | ICD-10-CM | POA: Diagnosis not present

## 2022-02-19 DIAGNOSIS — I509 Heart failure, unspecified: Secondary | ICD-10-CM | POA: Diagnosis not present

## 2022-02-19 DIAGNOSIS — M6281 Muscle weakness (generalized): Secondary | ICD-10-CM | POA: Diagnosis not present

## 2022-02-19 DIAGNOSIS — R2689 Other abnormalities of gait and mobility: Secondary | ICD-10-CM | POA: Diagnosis not present

## 2022-02-20 DIAGNOSIS — I509 Heart failure, unspecified: Secondary | ICD-10-CM | POA: Diagnosis not present

## 2022-02-20 DIAGNOSIS — R262 Difficulty in walking, not elsewhere classified: Secondary | ICD-10-CM | POA: Diagnosis not present

## 2022-02-20 DIAGNOSIS — M6281 Muscle weakness (generalized): Secondary | ICD-10-CM | POA: Diagnosis not present

## 2022-02-20 DIAGNOSIS — R2689 Other abnormalities of gait and mobility: Secondary | ICD-10-CM | POA: Diagnosis not present

## 2022-02-23 DIAGNOSIS — M6281 Muscle weakness (generalized): Secondary | ICD-10-CM | POA: Diagnosis not present

## 2022-02-23 DIAGNOSIS — R2689 Other abnormalities of gait and mobility: Secondary | ICD-10-CM | POA: Diagnosis not present

## 2022-02-23 DIAGNOSIS — R262 Difficulty in walking, not elsewhere classified: Secondary | ICD-10-CM | POA: Diagnosis not present

## 2022-02-23 DIAGNOSIS — I509 Heart failure, unspecified: Secondary | ICD-10-CM | POA: Diagnosis not present

## 2022-02-24 DIAGNOSIS — R2689 Other abnormalities of gait and mobility: Secondary | ICD-10-CM | POA: Diagnosis not present

## 2022-02-24 DIAGNOSIS — M6281 Muscle weakness (generalized): Secondary | ICD-10-CM | POA: Diagnosis not present

## 2022-02-24 DIAGNOSIS — I509 Heart failure, unspecified: Secondary | ICD-10-CM | POA: Diagnosis not present

## 2022-02-24 DIAGNOSIS — R262 Difficulty in walking, not elsewhere classified: Secondary | ICD-10-CM | POA: Diagnosis not present

## 2022-02-26 DIAGNOSIS — R2689 Other abnormalities of gait and mobility: Secondary | ICD-10-CM | POA: Diagnosis not present

## 2022-02-26 DIAGNOSIS — R262 Difficulty in walking, not elsewhere classified: Secondary | ICD-10-CM | POA: Diagnosis not present

## 2022-02-26 DIAGNOSIS — I509 Heart failure, unspecified: Secondary | ICD-10-CM | POA: Diagnosis not present

## 2022-02-26 DIAGNOSIS — M6281 Muscle weakness (generalized): Secondary | ICD-10-CM | POA: Diagnosis not present

## 2022-02-27 DIAGNOSIS — M6281 Muscle weakness (generalized): Secondary | ICD-10-CM | POA: Diagnosis not present

## 2022-02-27 DIAGNOSIS — R262 Difficulty in walking, not elsewhere classified: Secondary | ICD-10-CM | POA: Diagnosis not present

## 2022-02-27 DIAGNOSIS — I509 Heart failure, unspecified: Secondary | ICD-10-CM | POA: Diagnosis not present

## 2022-02-27 DIAGNOSIS — R2689 Other abnormalities of gait and mobility: Secondary | ICD-10-CM | POA: Diagnosis not present

## 2022-03-02 DIAGNOSIS — R262 Difficulty in walking, not elsewhere classified: Secondary | ICD-10-CM | POA: Diagnosis not present

## 2022-03-02 DIAGNOSIS — R2689 Other abnormalities of gait and mobility: Secondary | ICD-10-CM | POA: Diagnosis not present

## 2022-03-02 DIAGNOSIS — I509 Heart failure, unspecified: Secondary | ICD-10-CM | POA: Diagnosis not present

## 2022-03-02 DIAGNOSIS — M6281 Muscle weakness (generalized): Secondary | ICD-10-CM | POA: Diagnosis not present

## 2022-03-04 DIAGNOSIS — K269 Duodenal ulcer, unspecified as acute or chronic, without hemorrhage or perforation: Secondary | ICD-10-CM | POA: Diagnosis not present

## 2022-03-04 DIAGNOSIS — M25511 Pain in right shoulder: Secondary | ICD-10-CM | POA: Diagnosis not present

## 2022-03-04 DIAGNOSIS — K219 Gastro-esophageal reflux disease without esophagitis: Secondary | ICD-10-CM | POA: Diagnosis not present

## 2022-03-04 DIAGNOSIS — M6281 Muscle weakness (generalized): Secondary | ICD-10-CM | POA: Diagnosis not present

## 2022-03-04 DIAGNOSIS — R627 Adult failure to thrive: Secondary | ICD-10-CM | POA: Diagnosis not present

## 2022-03-04 DIAGNOSIS — I4891 Unspecified atrial fibrillation: Secondary | ICD-10-CM | POA: Diagnosis not present

## 2022-03-04 DIAGNOSIS — I5030 Unspecified diastolic (congestive) heart failure: Secondary | ICD-10-CM | POA: Diagnosis not present

## 2022-03-04 DIAGNOSIS — R2689 Other abnormalities of gait and mobility: Secondary | ICD-10-CM | POA: Diagnosis not present

## 2022-03-04 DIAGNOSIS — R262 Difficulty in walking, not elsewhere classified: Secondary | ICD-10-CM | POA: Diagnosis not present

## 2022-03-04 DIAGNOSIS — R5381 Other malaise: Secondary | ICD-10-CM | POA: Diagnosis not present

## 2022-03-04 DIAGNOSIS — E1151 Type 2 diabetes mellitus with diabetic peripheral angiopathy without gangrene: Secondary | ICD-10-CM | POA: Diagnosis not present

## 2022-03-04 DIAGNOSIS — I739 Peripheral vascular disease, unspecified: Secondary | ICD-10-CM | POA: Diagnosis not present

## 2022-03-04 DIAGNOSIS — I509 Heart failure, unspecified: Secondary | ICD-10-CM | POA: Diagnosis not present

## 2022-03-06 IMAGING — CT CT HEAD W/O CM
4 of 5 series · 14 of 47 positions shown, 16 images · non-contrast
Comparison: Brain MRI 06/24/2017.  Head CT 06/10/2018.

CLINICAL DATA: 54-year-old female found down, unresponsive.
Reportedly had seizure activity, apnea.

EXAM:
CT HEAD WITHOUT CONTRAST
TECHNIQUE: Contiguous axial images were obtained from the base of the skull
through the vertex without intravenous contrast.

[Series 2: head w o · axial · 0.47mm/px · z∈[+89,+149]mm · 3 of 31 slices shown]
[im 7/31  brain]
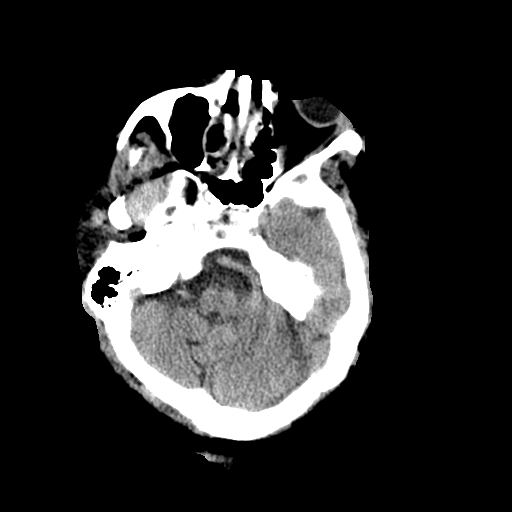
[im 13/31  brain]
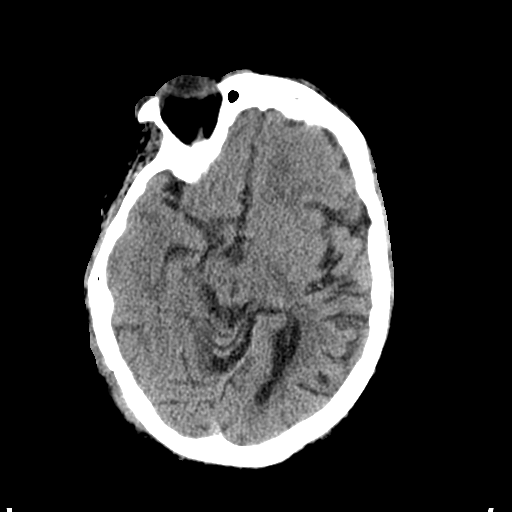
[im 19/31  brain]
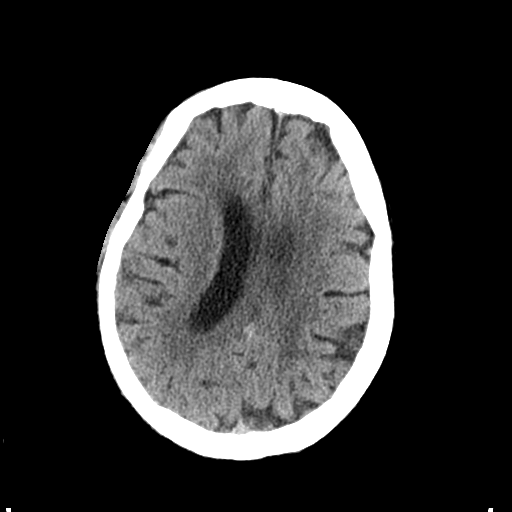

[Series 4: coronal soft · coronal · 0.31mm/px · 3 of 72 slices shown]
[im 24/72  brain]
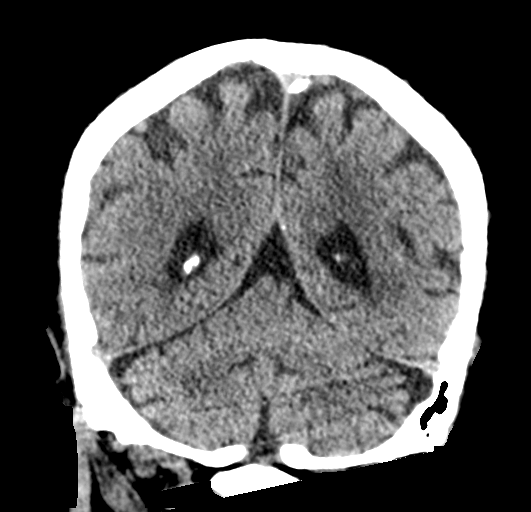
[im 32/72  brain]
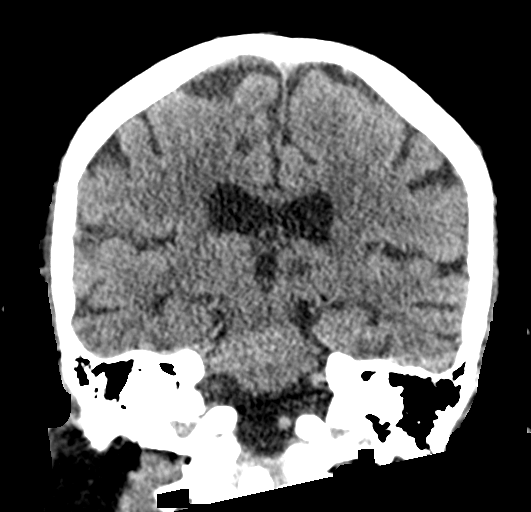
[im 40/72  brain]
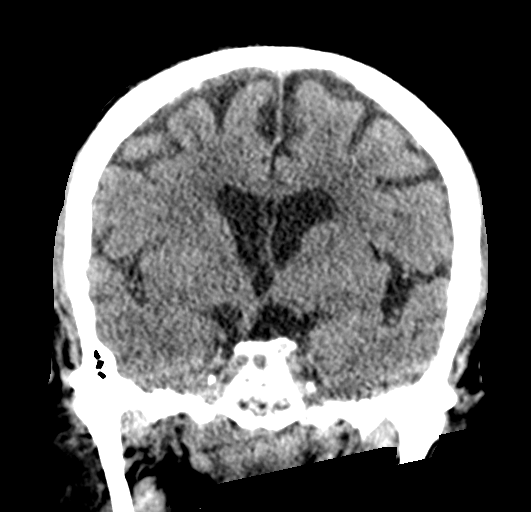

[Series 5: sagittal soft · sagittal · 0.32mm/px · 3 of 56 slices shown]
[im 23/56  brain]
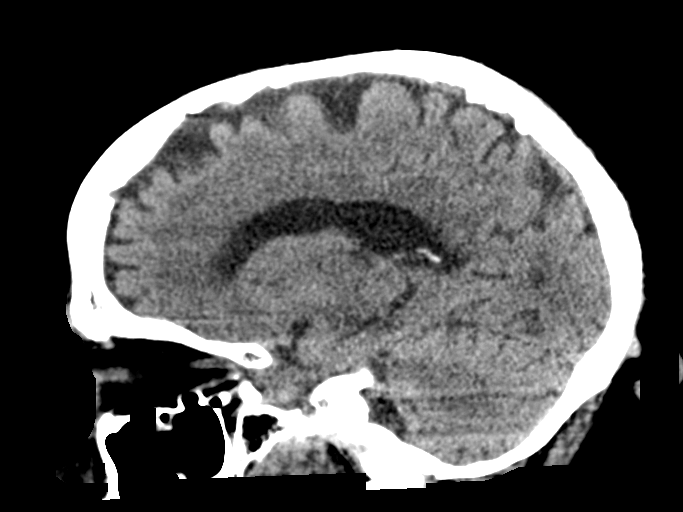
[im 28/56  brain]
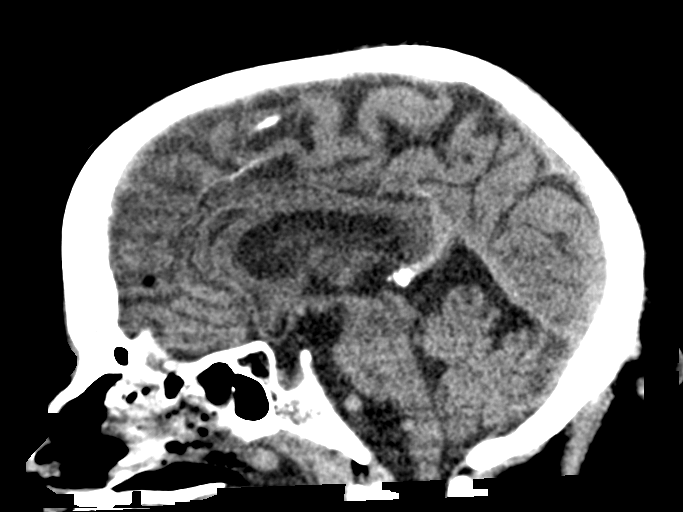
[im 33/56  brain]
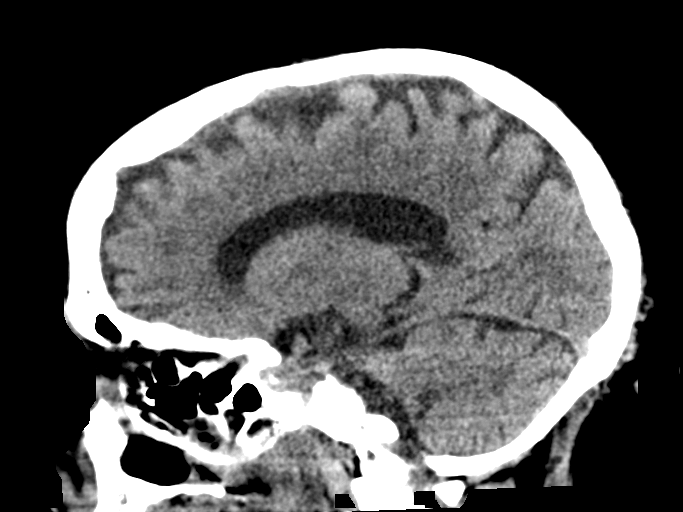

[Series 6: head ax w o · axial · 0.33mm/px · z∈[+109,+205]mm · 5 of 31 slices shown, 7 images]
[im 6/31  brain]
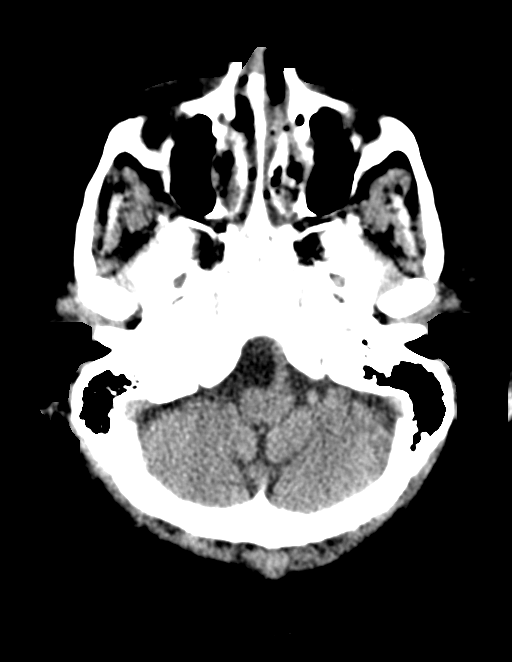
[im 6/31  bone]
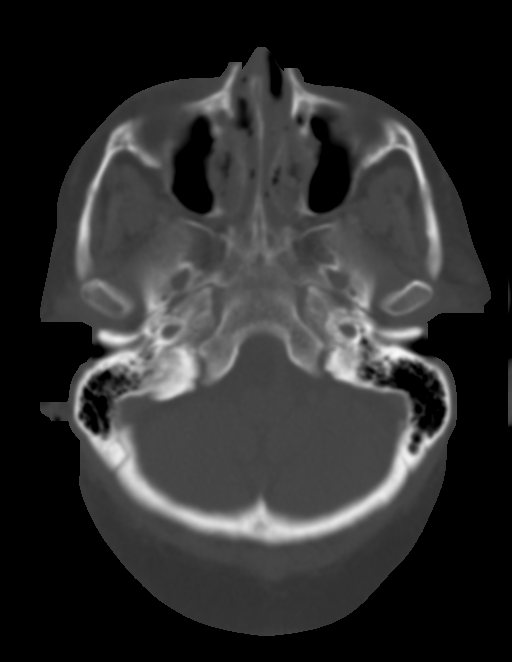
[im 11/31  brain]
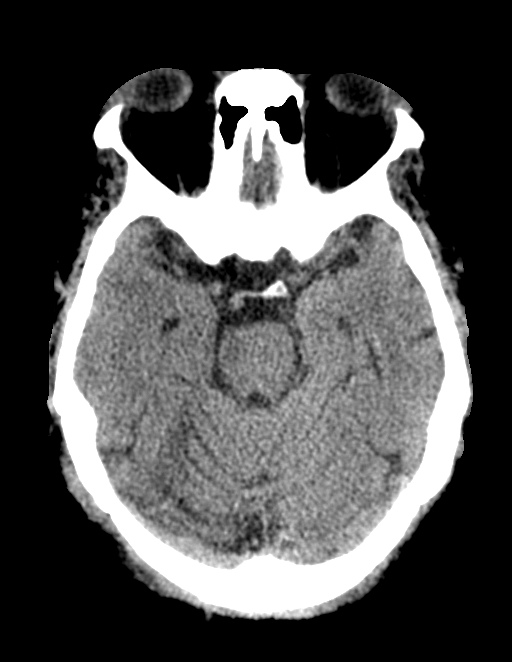
[im 16/31  brain]
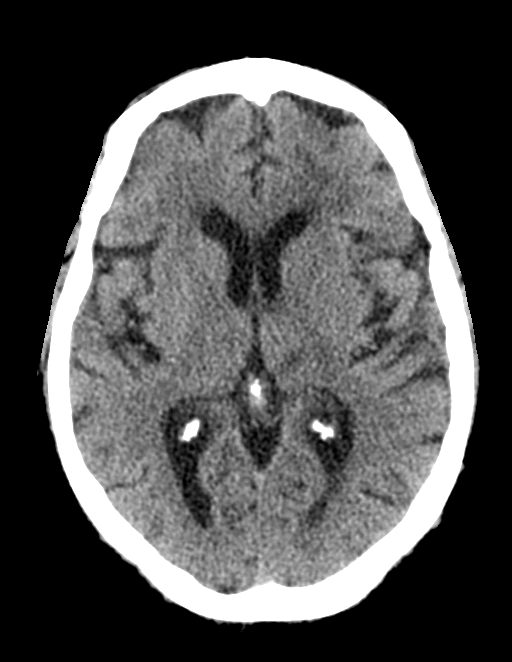
[im 21/31  brain]
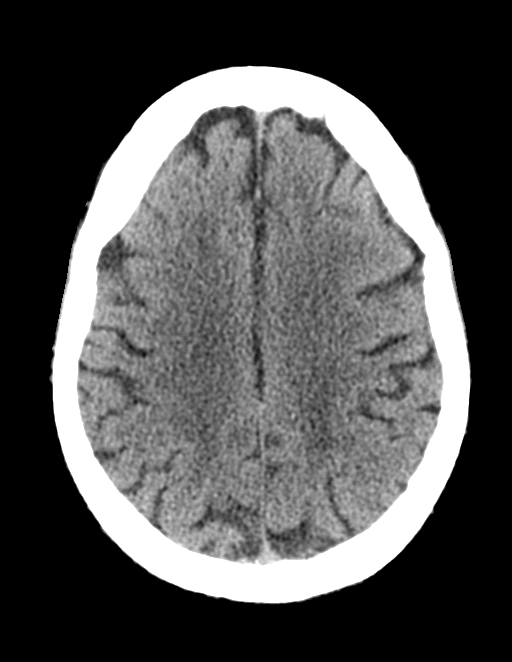
[im 26/31  brain]
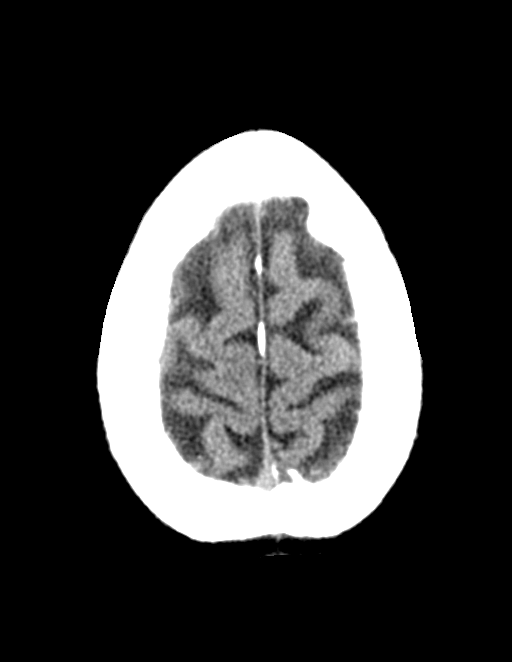
[im 26/31  bone]
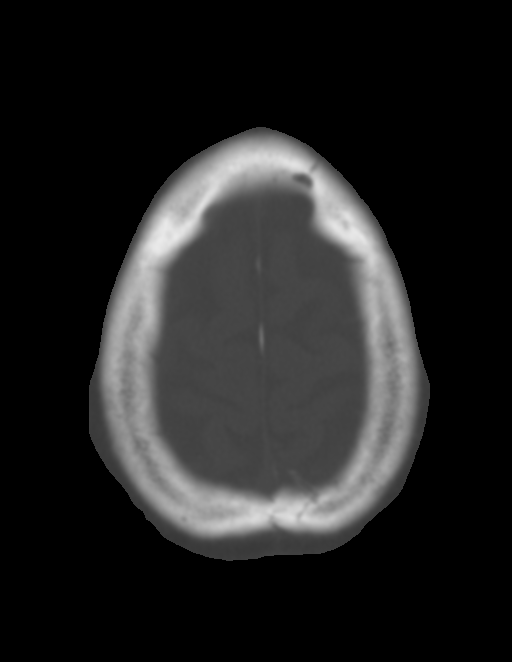

[14 of 47 positions shown; findings below may reference images not displayed]

FINDINGS: Brain: Stable cerebral volume. No midline shift, ventriculomegaly,
mass effect, evidence of mass lesion, intracranial hemorrhage or
evidence of cortically based acute infarction.

Chronic lacunar infarct of the left thalamus. Stable patchy
bilateral white matter hypodensity. No cortical encephalomalacia
identified. Chronic cerebellar infarcts better demonstrated by MRI.

Vascular: Calcified atherosclerosis at the skull base. No suspicious
intracranial vascular hyperdensity.

Skull: Chronic left lamina papyracea fracture. No acute osseous
abnormality identified.

Sinuses/Orbits: Visualized paranasal sinuses and mastoids are stable
and well pneumatized.

Other: No acute orbit or scalp soft tissue finding.

Fluid in the visible pharynx related to intubation.
IMPRESSION: 1. No acute intracranial abnormality identified.
2. Stable non contrast CT appearance of chronic small vessel disease
since [DATE].

## 2022-03-06 IMAGING — DX DG CHEST 1V PORT
1 series · 1 of 1 positions shown · non-contrast
Comparison: 06/09/2018

CLINICAL DATA: Altered mental status following intubation

EXAM:
PORTABLE CHEST 1 VIEW

[chest ap grid]
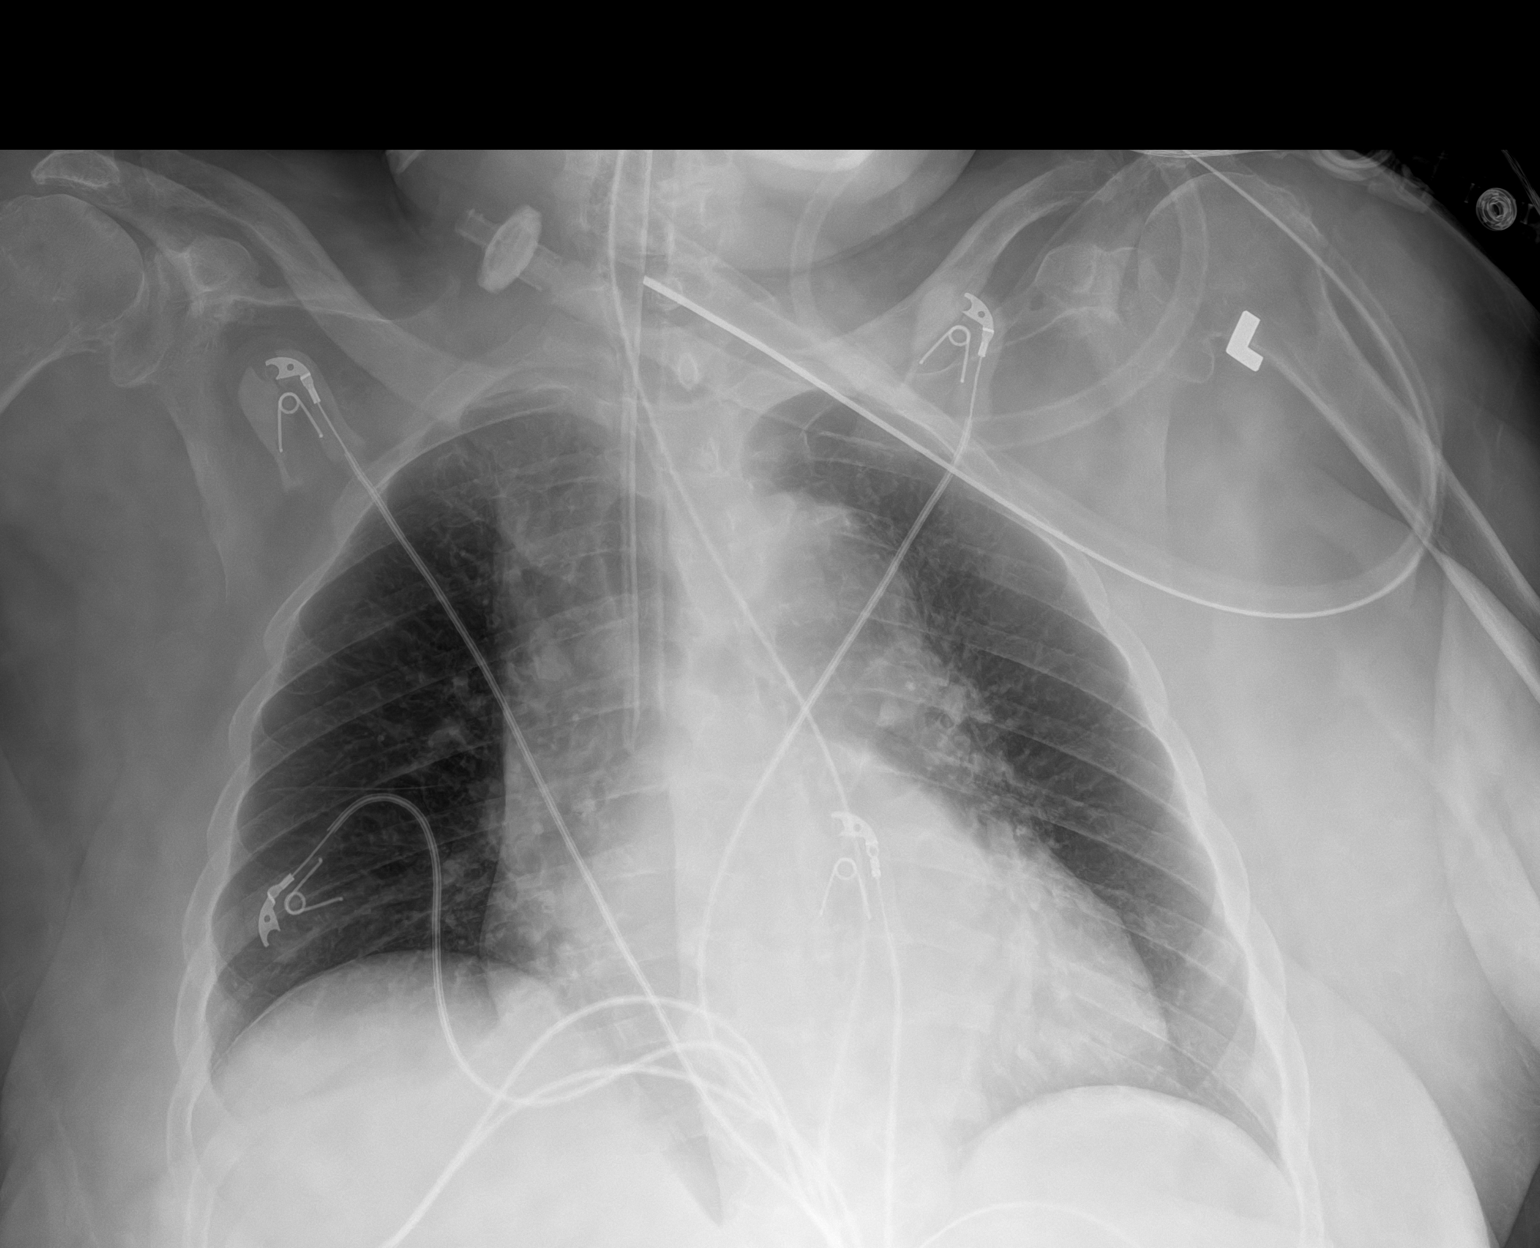

[1 of 1 positions shown; findings below may reference images not displayed]

FINDINGS: Cardiac shadow is mildly enlarged but stable. Endotracheal tube is
noted within the right mainstem bronchus and should be withdrawn 3-4
cm. Gastric catheter extends into the stomach although the tip is
not visualized on this film. Lungs are clear bilaterally. No bony
abnormality is seen.
IMPRESSION: Endotracheal tube as described. This should be withdrawn 3-4 cm.
Gastric catheter extends into the stomach. No other focal
abnormality is noted.

Critical Value/emergent results were called by telephone at the time
of interpretation on 08/13/2020 at [DATE] to Dr. FERIENHAUS ERXLEBEN , who
verbally acknowledged these results.

## 2022-03-06 IMAGING — DX DG CHEST 1V PORT SAME DAY
1 series · 2 of 2 positions shown · non-contrast
Comparison: 08/13/2020

CLINICAL DATA: Endotracheal tube and enteric tube placements.

EXAM:
PORTABLE CHEST 1 VIEW

[Series 2: chest ap grid · 0.14mm/px · 2 of 2 slices shown]
[im 1/2]
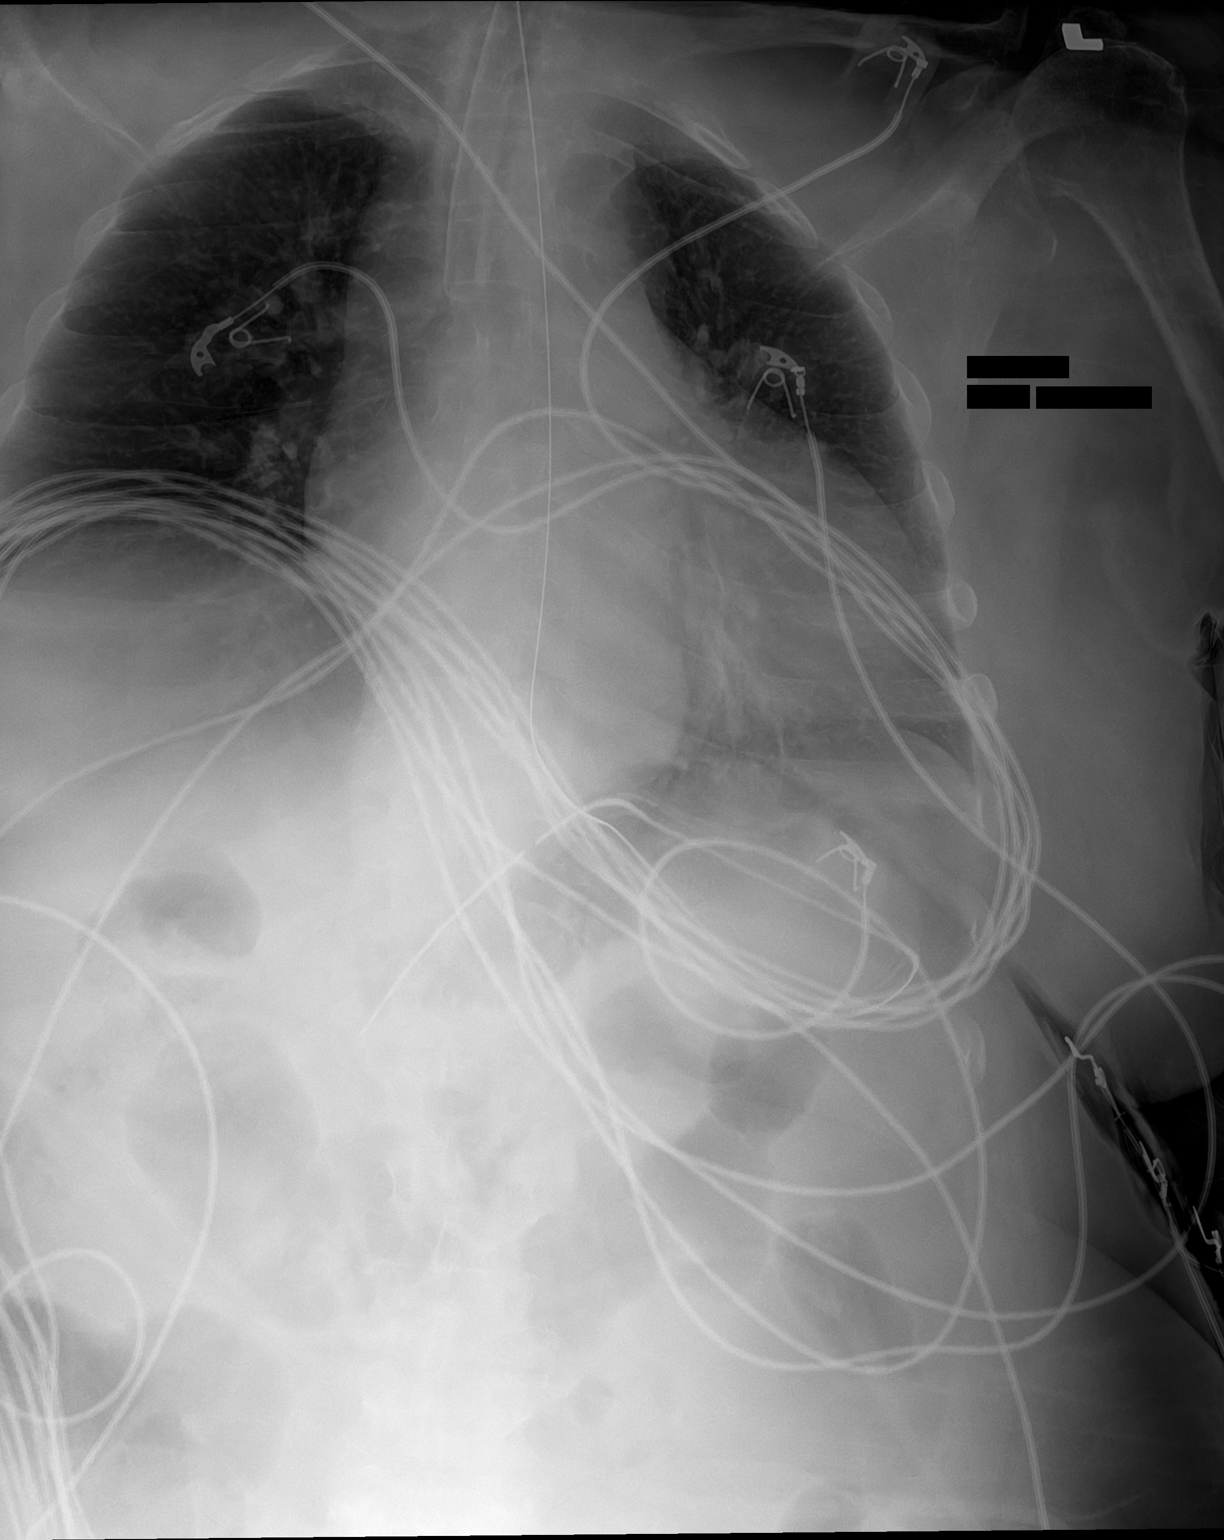
[im 2/2]
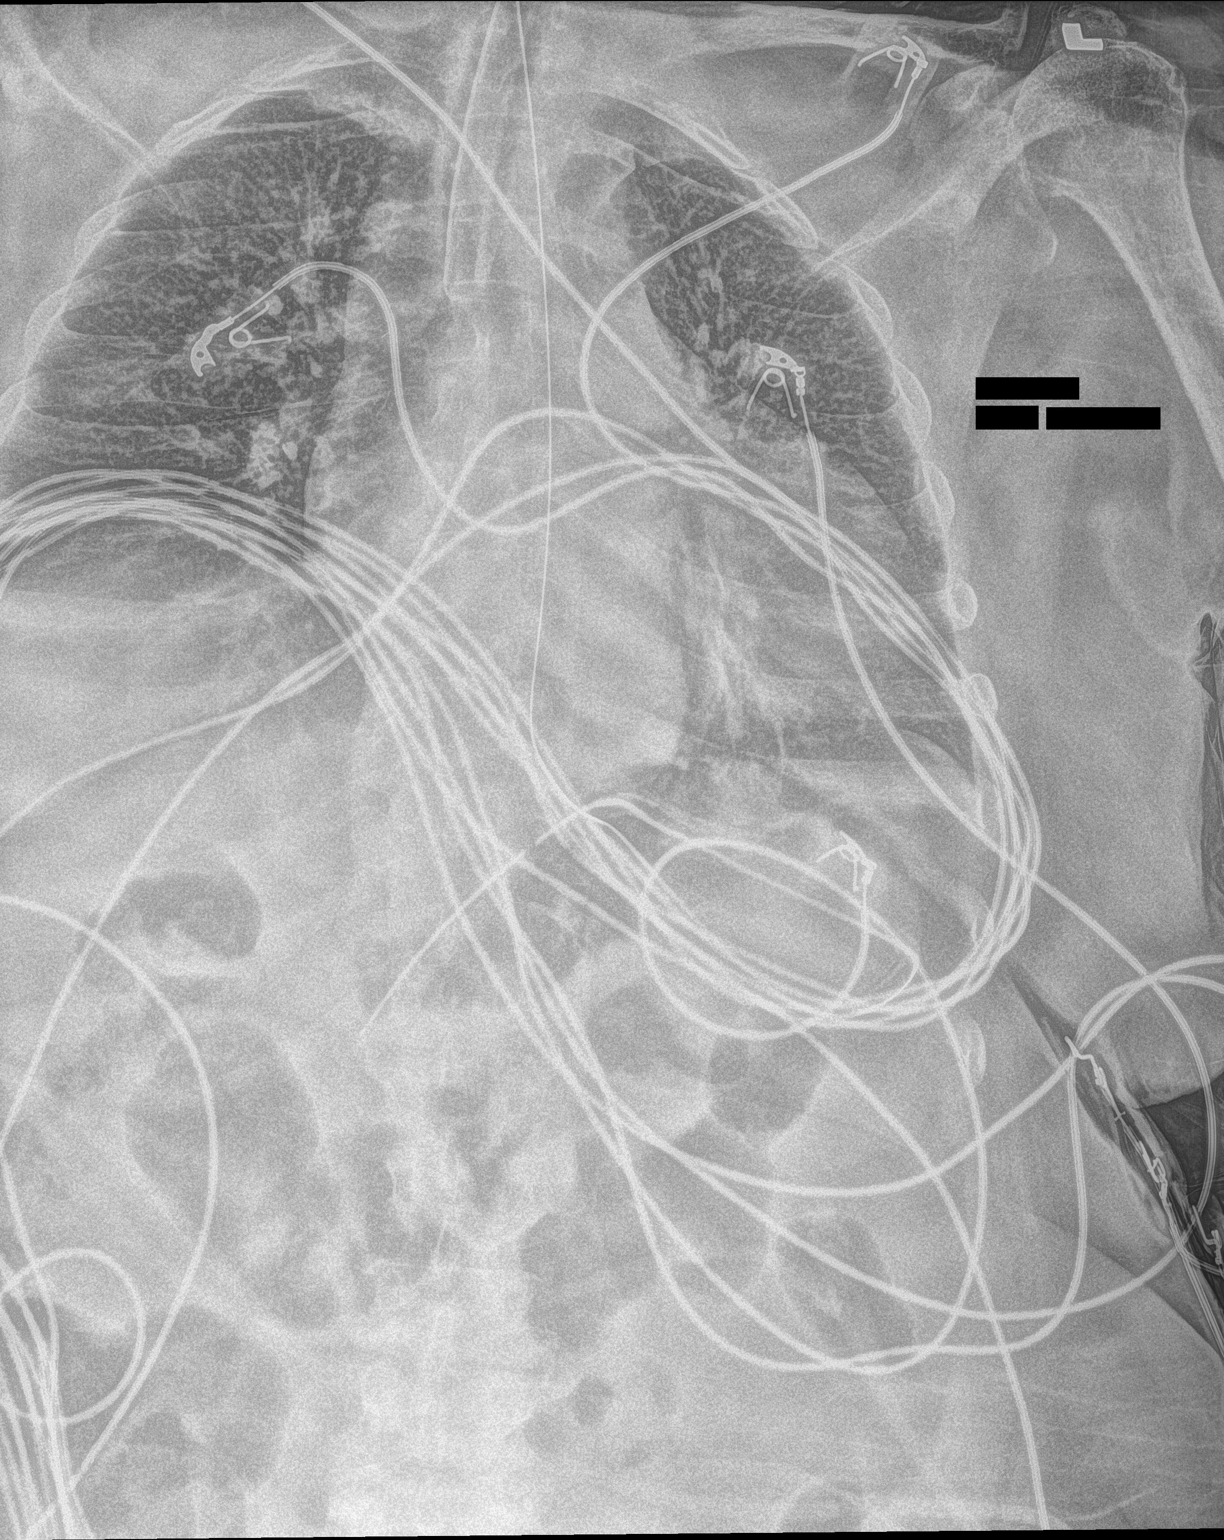

[2 of 2 positions shown; findings below may reference images not displayed]

FINDINGS: Endotracheal tube with tip measuring 1.9 cm above the carina.
Enteric tube tip is in the right upper quadrant consistent with
location in the distal stomach. Shallow inspiration. Cardiac
enlargement. Atelectasis or infiltration in the left lung base
behind the heart. No pleural effusions. No pneumothorax. Mediastinal
contours appear intact.
IMPRESSION: Appliances appear in satisfactory location. Cardiac enlargement.
Shallow inspiration with atelectasis or infiltration in the left
lung base behind the heart.

## 2022-03-07 DIAGNOSIS — I509 Heart failure, unspecified: Secondary | ICD-10-CM | POA: Diagnosis not present

## 2022-03-07 DIAGNOSIS — M6281 Muscle weakness (generalized): Secondary | ICD-10-CM | POA: Diagnosis not present

## 2022-03-07 DIAGNOSIS — R2689 Other abnormalities of gait and mobility: Secondary | ICD-10-CM | POA: Diagnosis not present

## 2022-03-07 DIAGNOSIS — R262 Difficulty in walking, not elsewhere classified: Secondary | ICD-10-CM | POA: Diagnosis not present

## 2022-03-08 IMAGING — RF DG FLUORO GUIDE SPINAL/SI JT INJ*L*
1 series · 1 of 1 positions shown · non-contrast
Comparison: none

CLINICAL DATA: Altered mental status, possible meningitis

[Series 2: cp_standard · 0.17mm/px · 1 of 1 slices shown]
[im 1/1]
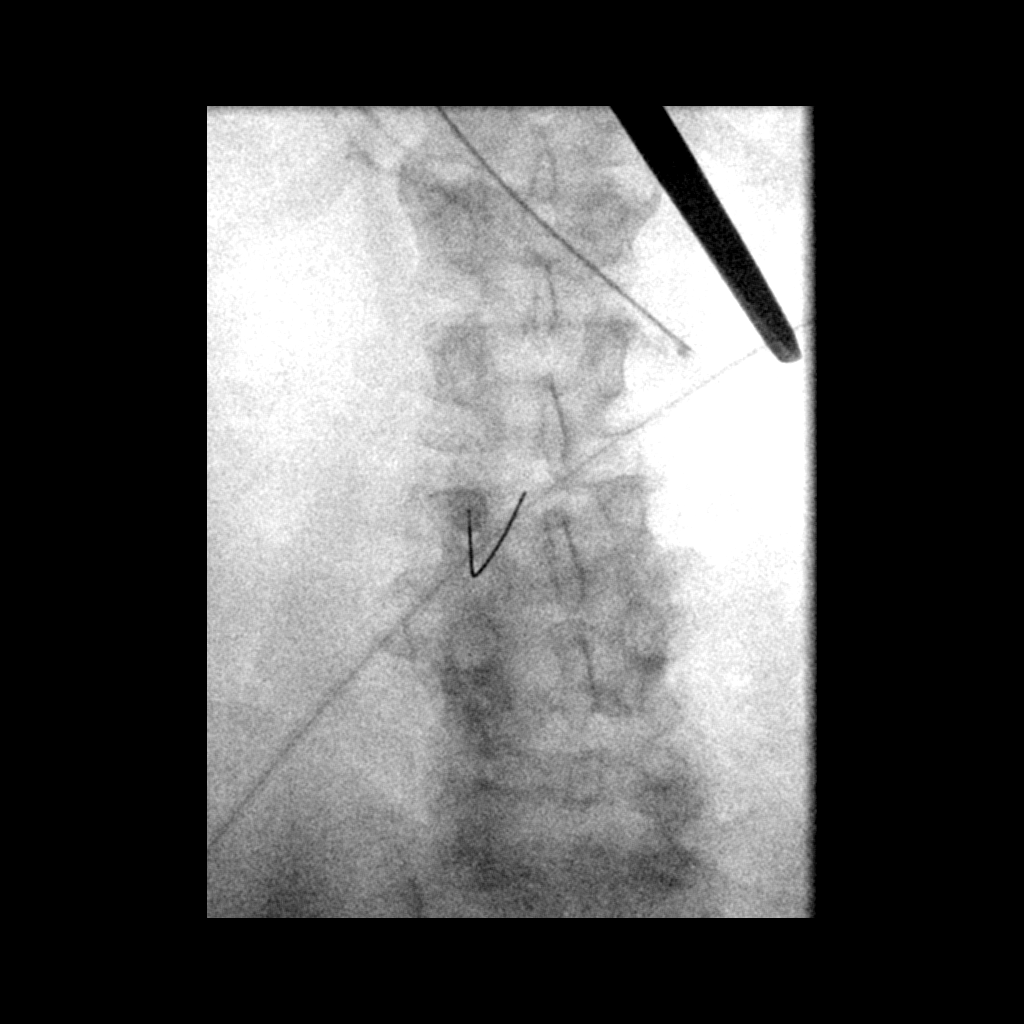

[1 of 1 positions shown; findings below may reference images not displayed]

EXAM:
DIAGNOSTIC LUMBAR PUNCTURE UNDER FLUOROSCOPIC GUIDANCE

FLUOROSCOPY TIME:  Fluoroscopy Time:  18 seconds

Radiation Exposure Index (if provided by the fluoroscopic device):
26.8 mGy

PROCEDURE:
Informed consent was obtained from the patient prior to the
procedure, including potential complications of headache, allergy,
and pain. With the patient prone, the lower back was prepped with
Betadine. 1% Lidocaine was used for local anesthesia. Lumbar
puncture was performed at the L2-L3 level using a 22 gauge needle
with return of clear CSF with normal opening pressure. 9 mL of CSF
were obtained for laboratory studies. The patient tolerated the
procedure well and there were no apparent complications.
IMPRESSION: Technically successful fluoroscopic guided lumbar puncture.

## 2022-03-08 IMAGING — MR MR HEAD WO/W CM
12 of 14 series · 35 of 48 positions shown · IV contrast (gadavist)
Comparison: MRI 06/24/2017, CT head 08/13/2020

CLINICAL DATA: Delirium. Reportedly had seizure activity. History
of polysubstance abuse, diabetes, hypertension.

EXAM:
MRI HEAD WITHOUT AND WITH CONTRAST
TECHNIQUE: Multiplanar, multiecho pulse sequences of the brain and surrounding
structures were obtained without and with intravenous contrast.
CONTRAST:  8mL GADAVIST GADOBUTROL 1 MMOL/ML IV SOLN

[Series 7: T2 · sagittal · 5.0mm · 0.47mm/px · 2 of 24 slices shown (1 of 3)]
[im 1/24]
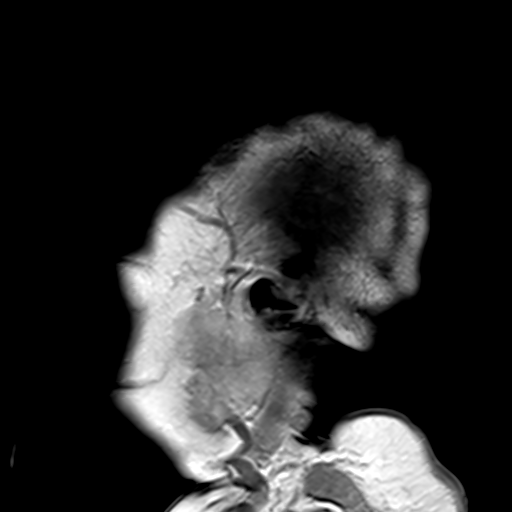
[im 24/24]
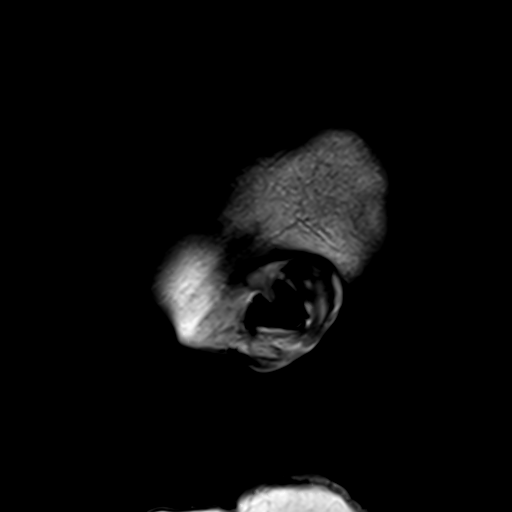

[Series 8: T2 · axial · 5.0mm · 0.45mm/px · z∈[-51,+95]mm · 2 of 20 slices shown (2 of 3)]
[im 1/20]
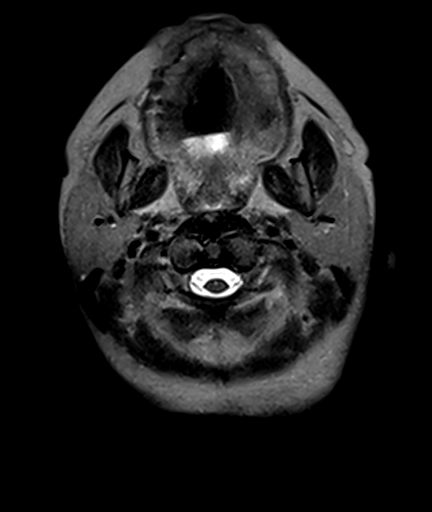
[im 20/20]
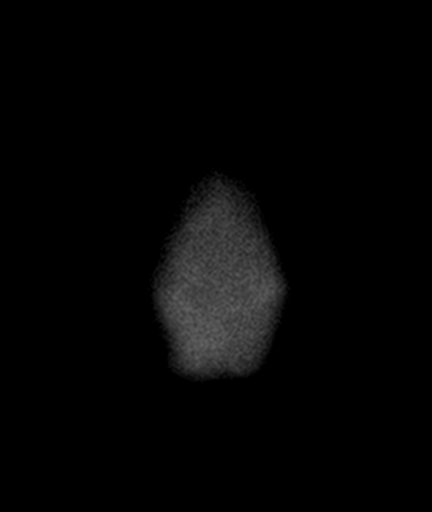

[Series 9: GRE · axial · 3.0mm · 0.45mm/px · z∈[-52,+96]mm · 4 of 52 slices shown]
[im 1/52]
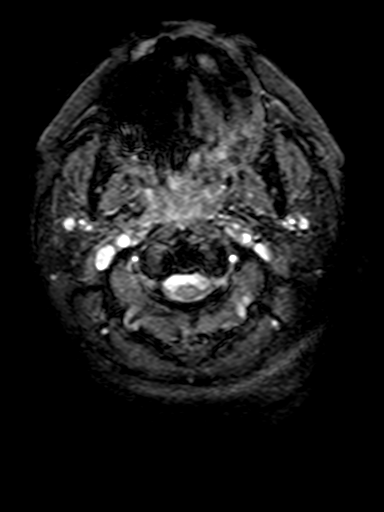
[im 18/52]
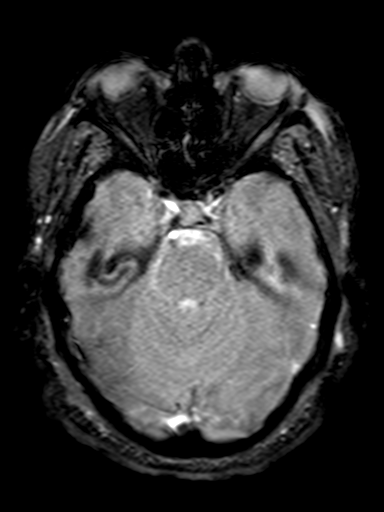
[im 35/52]
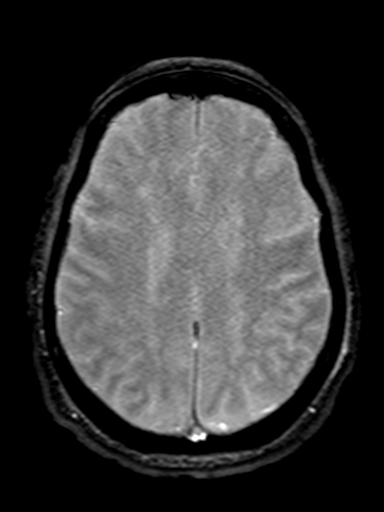
[im 52/52]
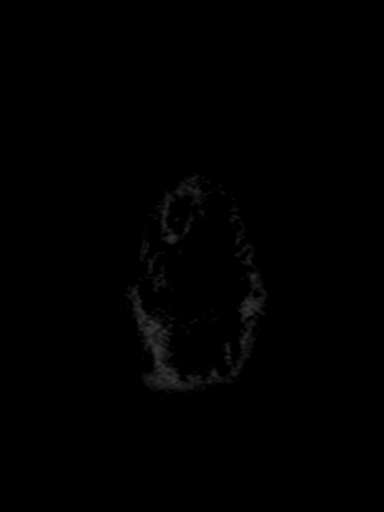

[Series 10: T2 · axial · 5.0mm · 0.45mm/px · z∈[-51,+95]mm · 2 of 24 slices shown (3 of 3)]
[im 1/24]
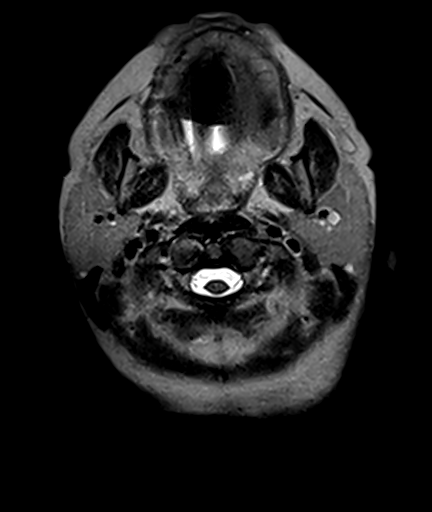
[im 24/24]
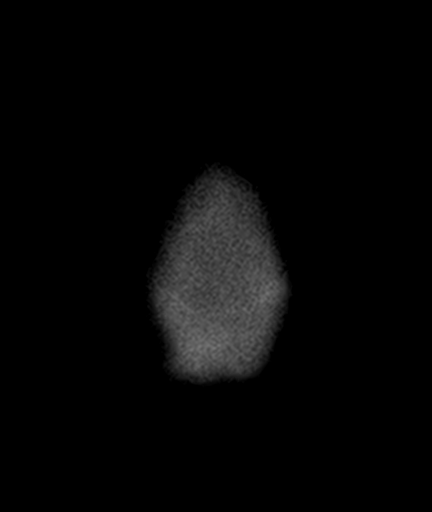

[Series 11: FLAIR · axial · 3.0mm · 0.86mm/px · z∈[-50,+96]mm · 4 of 51 slices shown]
[im 1/51]
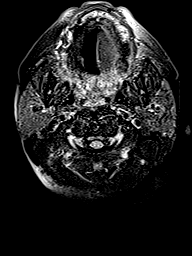
[im 17/51]
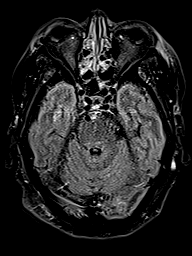
[im 34/51]
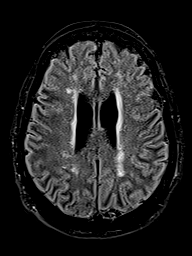
[im 51/51]
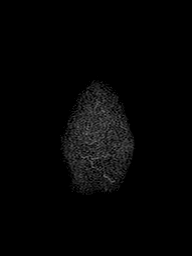

[Series 12: T1 · axial · 3.0mm · 0.45mm/px · z∈[-48,-1]mm · 2 of 51 slices shown]
[im 1/51]
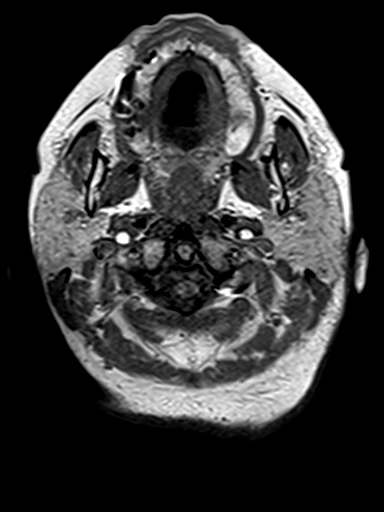
[im 17/51]
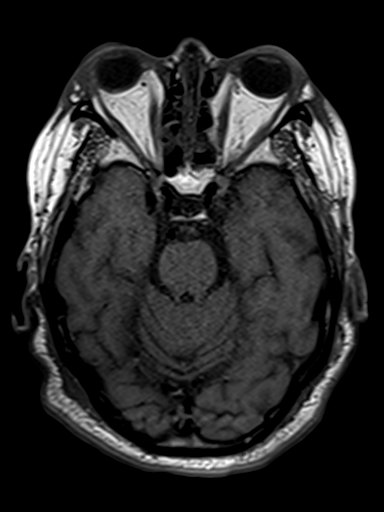

[Series 13: DWI · coronal · 5.0mm · 1.31mm/px · 5 of 63 slices shown (1 of 2)]
[im 1/63]
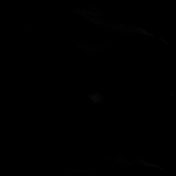
[im 16/63]
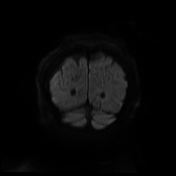
[im 32/63]
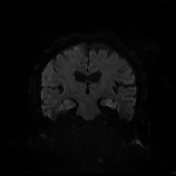
[im 47/63]
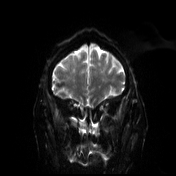
[im 63/63]
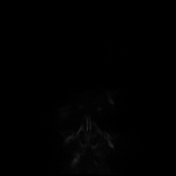

[Series 14: DWI · coronal · 5.0mm · 1.31mm/px · 2 of 30 slices shown (2 of 2)]
[im 1/30]
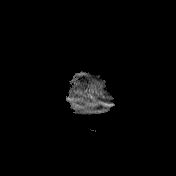
[im 30/30]
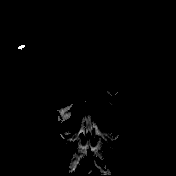

[Series 15: T2 post-contrast · coronal · 5.0mm · 0.86mm/px · 3 of 32 slices shown]
[im 1/32]
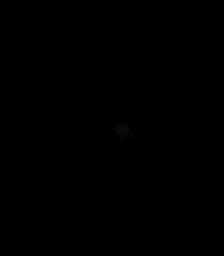
[im 16/32]
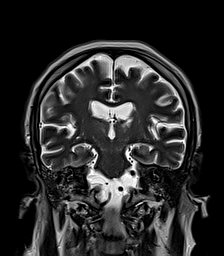
[im 32/32]
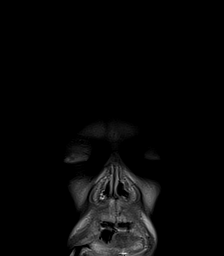

[Series 16: T1 post-contrast · axial · 3.0mm · 0.45mm/px · z∈[-48,+98]mm · 4 of 51 slices shown (1 of 3)]
[im 1/51]
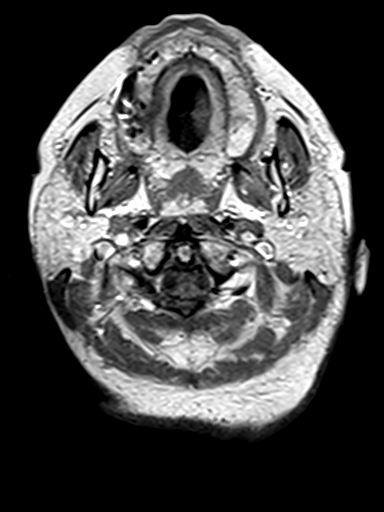
[im 17/51]
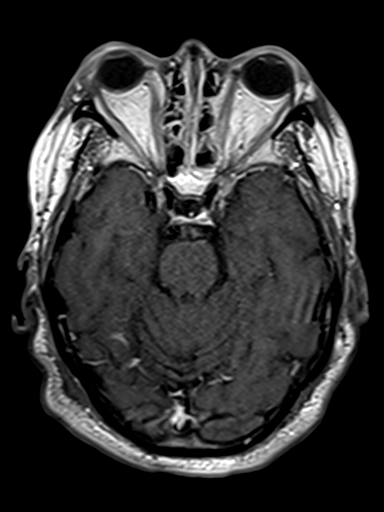
[im 34/51]
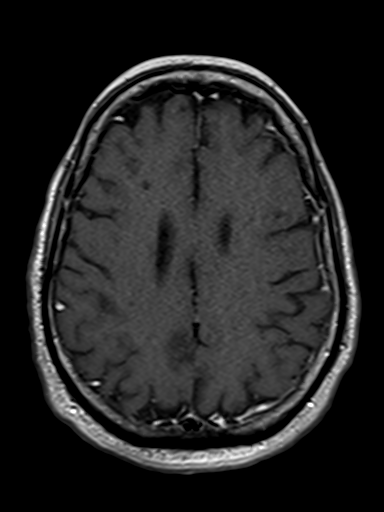
[im 51/51]
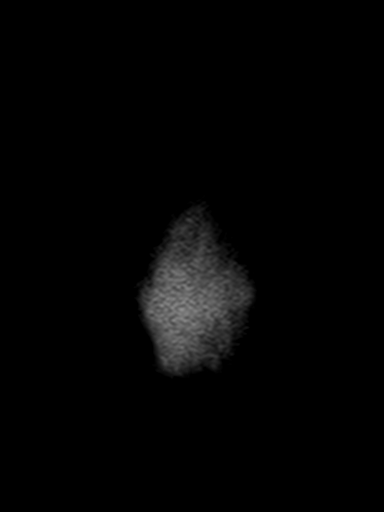

[Series 17: T1 post-contrast · coronal · 5.0mm · 0.43mm/px · 3 of 32 slices shown (2 of 3)]
[im 1/32]
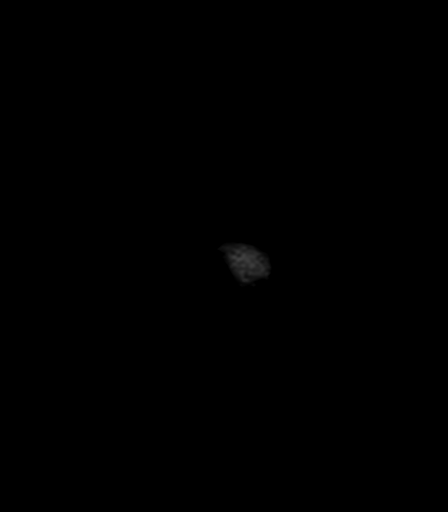
[im 16/32]
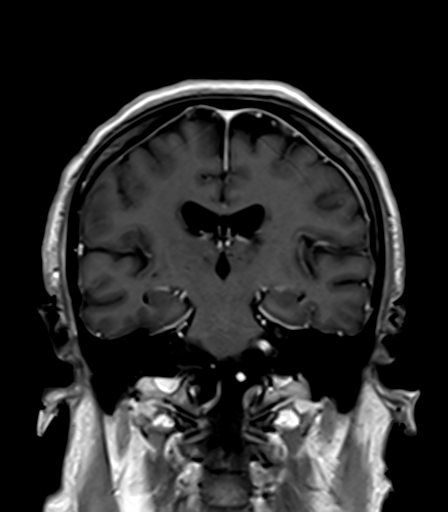
[im 32/32]
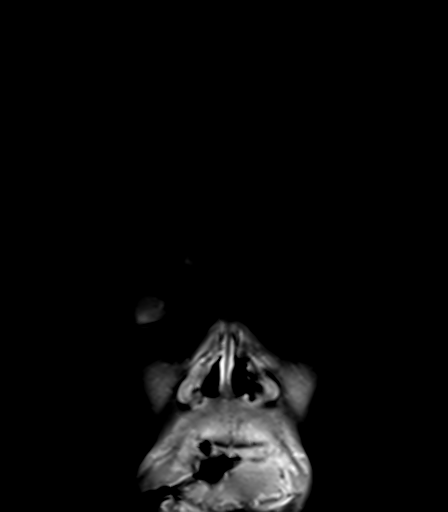

[Series 18: T1 post-contrast · sagittal · 5.0mm · 0.94mm/px · 2 of 24 slices shown (3 of 3)]
[im 1/24]
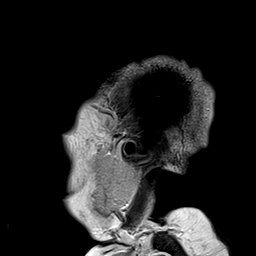
[im 24/24]
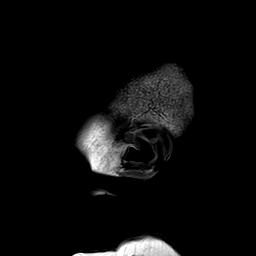

[35 of 48 positions shown; findings below may reference images not displayed]

FINDINGS: Brain: Small focus of apparent restricted diffusion in the right
basal ganglia (series 5, image 77), compatible with small
acute/early subacute infarct. No hydrocephalus. No acute hemorrhage.
No mass lesion or abnormal mass effect. No abnormal enhancement.
There are numerous punctate areas of susceptibility artifact
throughout bilateral basal ganglia and thalami, compatible with
prior hemorrhage. Larger focus of susceptibility artifact in the
right frontal lobe, also compatible with prior hemorrhage. Scattered
T2/FLAIR hyperintensities within the white matter, age advanced in
number. Small bilateral remote cerebellar loop canal or infarcts.

Vascular: Major proximal arterial flow voids are maintained at the
skull base.

Skull and upper cervical spine: Normal marrow signal. Degenerative
changes of the imaged upper cervical spine with likely moderate
canal stenosis at multiple levels.

Sinuses/Orbits: Moderate paranasal sinus mucosal thickening
throughout the sinuses.

Other: Small bilateral mastoid effusions.
IMPRESSION: 1. Small acute/early subacute infarct within the right basal ganglia
2. Scattered T2/FLAIR hyperintensities within the white matter, most
likely secondary to age advanced chronic microvascular ischemic
disease given clinical risk factors.
3. Numerous bilateral prior basal ganglia and thalamic
microhemorrhages with additional larger area of prior hemorrhage in
the right frontal lobe. While nonspecific, the distribution suggests
prior hypertensive hemorrhages.
4. Small remote bilateral cerebellar lacunar infarcts.

## 2022-03-09 DIAGNOSIS — I5030 Unspecified diastolic (congestive) heart failure: Secondary | ICD-10-CM | POA: Diagnosis not present

## 2022-03-09 DIAGNOSIS — D638 Anemia in other chronic diseases classified elsewhere: Secondary | ICD-10-CM | POA: Diagnosis not present

## 2022-03-09 DIAGNOSIS — I4891 Unspecified atrial fibrillation: Secondary | ICD-10-CM | POA: Diagnosis not present

## 2022-03-09 DIAGNOSIS — E1151 Type 2 diabetes mellitus with diabetic peripheral angiopathy without gangrene: Secondary | ICD-10-CM | POA: Diagnosis not present

## 2022-03-09 DIAGNOSIS — I10 Essential (primary) hypertension: Secondary | ICD-10-CM | POA: Diagnosis not present

## 2022-03-10 DIAGNOSIS — R262 Difficulty in walking, not elsewhere classified: Secondary | ICD-10-CM | POA: Diagnosis not present

## 2022-03-10 DIAGNOSIS — I509 Heart failure, unspecified: Secondary | ICD-10-CM | POA: Diagnosis not present

## 2022-03-10 DIAGNOSIS — R2689 Other abnormalities of gait and mobility: Secondary | ICD-10-CM | POA: Diagnosis not present

## 2022-03-10 DIAGNOSIS — M6281 Muscle weakness (generalized): Secondary | ICD-10-CM | POA: Diagnosis not present

## 2022-03-12 DIAGNOSIS — R2689 Other abnormalities of gait and mobility: Secondary | ICD-10-CM | POA: Diagnosis not present

## 2022-03-12 DIAGNOSIS — R262 Difficulty in walking, not elsewhere classified: Secondary | ICD-10-CM | POA: Diagnosis not present

## 2022-03-12 DIAGNOSIS — M6281 Muscle weakness (generalized): Secondary | ICD-10-CM | POA: Diagnosis not present

## 2022-03-12 DIAGNOSIS — I509 Heart failure, unspecified: Secondary | ICD-10-CM | POA: Diagnosis not present

## 2022-03-13 DIAGNOSIS — R262 Difficulty in walking, not elsewhere classified: Secondary | ICD-10-CM | POA: Diagnosis not present

## 2022-03-13 DIAGNOSIS — M6281 Muscle weakness (generalized): Secondary | ICD-10-CM | POA: Diagnosis not present

## 2022-03-13 DIAGNOSIS — I509 Heart failure, unspecified: Secondary | ICD-10-CM | POA: Diagnosis not present

## 2022-03-13 DIAGNOSIS — R2689 Other abnormalities of gait and mobility: Secondary | ICD-10-CM | POA: Diagnosis not present

## 2022-03-14 IMAGING — DX DG ABDOMEN 1V
1 series · 1 of 1 positions shown · non-contrast
Comparison: None.

CLINICAL DATA: NG tube placement

EXAM:
ABDOMEN - 1 VIEW

[abdomen kub]
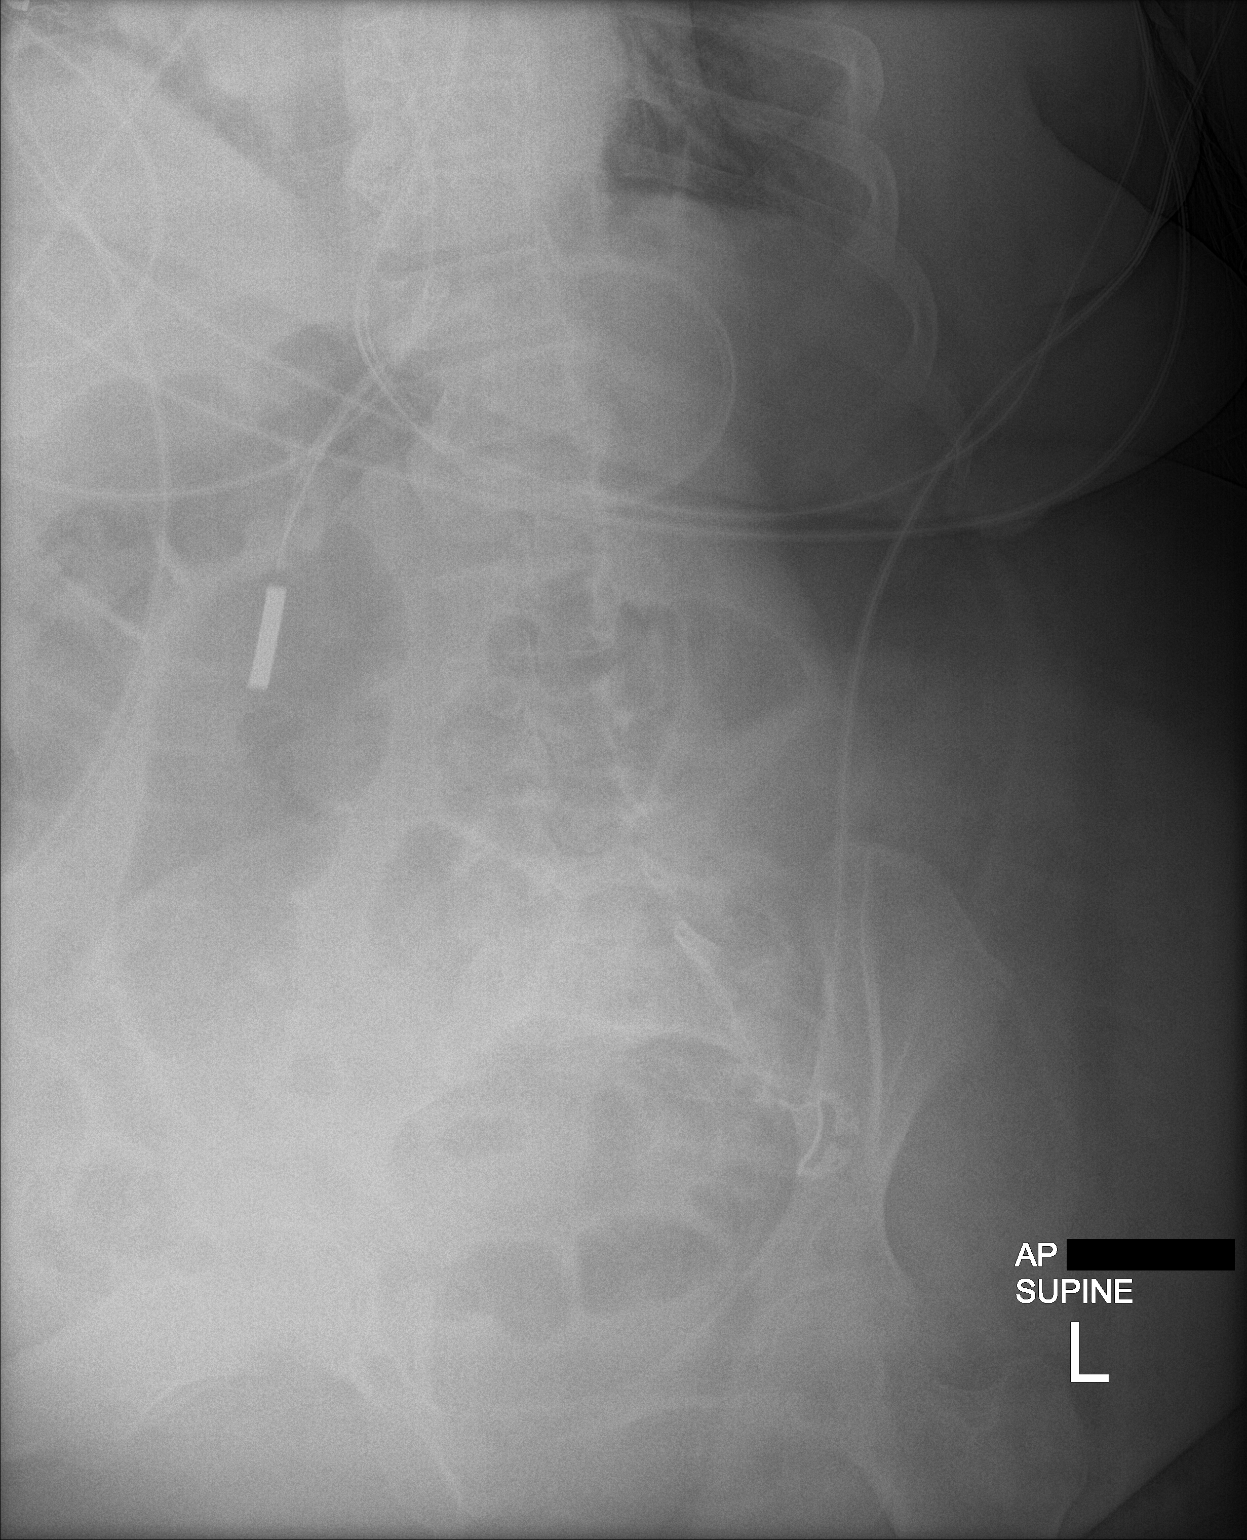

[1 of 1 positions shown; findings below may reference images not displayed]

FINDINGS: Feeding tube extends the stomach. Tip appears in the first portion
the duodenum.
IMPRESSION: NG tube in stomach. Feeding tube with tip in the first portion the
duodenum.

## 2022-03-17 DIAGNOSIS — R262 Difficulty in walking, not elsewhere classified: Secondary | ICD-10-CM | POA: Diagnosis not present

## 2022-03-17 DIAGNOSIS — M6281 Muscle weakness (generalized): Secondary | ICD-10-CM | POA: Diagnosis not present

## 2022-03-17 DIAGNOSIS — I509 Heart failure, unspecified: Secondary | ICD-10-CM | POA: Diagnosis not present

## 2022-03-17 DIAGNOSIS — R2689 Other abnormalities of gait and mobility: Secondary | ICD-10-CM | POA: Diagnosis not present

## 2022-03-18 DIAGNOSIS — M6281 Muscle weakness (generalized): Secondary | ICD-10-CM | POA: Diagnosis not present

## 2022-03-18 DIAGNOSIS — I509 Heart failure, unspecified: Secondary | ICD-10-CM | POA: Diagnosis not present

## 2022-03-18 DIAGNOSIS — R262 Difficulty in walking, not elsewhere classified: Secondary | ICD-10-CM | POA: Diagnosis not present

## 2022-03-18 DIAGNOSIS — R2689 Other abnormalities of gait and mobility: Secondary | ICD-10-CM | POA: Diagnosis not present

## 2022-03-20 DIAGNOSIS — R2689 Other abnormalities of gait and mobility: Secondary | ICD-10-CM | POA: Diagnosis not present

## 2022-03-20 DIAGNOSIS — R262 Difficulty in walking, not elsewhere classified: Secondary | ICD-10-CM | POA: Diagnosis not present

## 2022-03-20 DIAGNOSIS — M6281 Muscle weakness (generalized): Secondary | ICD-10-CM | POA: Diagnosis not present

## 2022-03-20 DIAGNOSIS — I509 Heart failure, unspecified: Secondary | ICD-10-CM | POA: Diagnosis not present

## 2022-03-23 DIAGNOSIS — E1165 Type 2 diabetes mellitus with hyperglycemia: Secondary | ICD-10-CM | POA: Diagnosis not present

## 2022-03-23 DIAGNOSIS — I130 Hypertensive heart and chronic kidney disease with heart failure and stage 1 through stage 4 chronic kidney disease, or unspecified chronic kidney disease: Secondary | ICD-10-CM | POA: Diagnosis not present

## 2022-03-24 DIAGNOSIS — R2689 Other abnormalities of gait and mobility: Secondary | ICD-10-CM | POA: Diagnosis not present

## 2022-03-24 DIAGNOSIS — R262 Difficulty in walking, not elsewhere classified: Secondary | ICD-10-CM | POA: Diagnosis not present

## 2022-03-24 DIAGNOSIS — M6281 Muscle weakness (generalized): Secondary | ICD-10-CM | POA: Diagnosis not present

## 2022-03-24 DIAGNOSIS — I509 Heart failure, unspecified: Secondary | ICD-10-CM | POA: Diagnosis not present

## 2022-03-26 DIAGNOSIS — I70209 Unspecified atherosclerosis of native arteries of extremities, unspecified extremity: Secondary | ICD-10-CM | POA: Diagnosis not present

## 2022-03-26 DIAGNOSIS — I739 Peripheral vascular disease, unspecified: Secondary | ICD-10-CM | POA: Diagnosis not present

## 2022-03-26 DIAGNOSIS — M25511 Pain in right shoulder: Secondary | ICD-10-CM | POA: Diagnosis not present

## 2022-03-26 DIAGNOSIS — R5381 Other malaise: Secondary | ICD-10-CM | POA: Diagnosis not present

## 2022-03-26 DIAGNOSIS — K269 Duodenal ulcer, unspecified as acute or chronic, without hemorrhage or perforation: Secondary | ICD-10-CM | POA: Diagnosis not present

## 2022-03-26 DIAGNOSIS — R262 Difficulty in walking, not elsewhere classified: Secondary | ICD-10-CM | POA: Diagnosis not present

## 2022-03-26 DIAGNOSIS — K219 Gastro-esophageal reflux disease without esophagitis: Secondary | ICD-10-CM | POA: Diagnosis not present

## 2022-03-26 DIAGNOSIS — M6281 Muscle weakness (generalized): Secondary | ICD-10-CM | POA: Diagnosis not present

## 2022-03-26 DIAGNOSIS — R627 Adult failure to thrive: Secondary | ICD-10-CM | POA: Diagnosis not present

## 2022-03-26 DIAGNOSIS — I509 Heart failure, unspecified: Secondary | ICD-10-CM | POA: Diagnosis not present

## 2022-03-26 DIAGNOSIS — I5030 Unspecified diastolic (congestive) heart failure: Secondary | ICD-10-CM | POA: Diagnosis not present

## 2022-03-26 DIAGNOSIS — E1151 Type 2 diabetes mellitus with diabetic peripheral angiopathy without gangrene: Secondary | ICD-10-CM | POA: Diagnosis not present

## 2022-03-26 DIAGNOSIS — R2689 Other abnormalities of gait and mobility: Secondary | ICD-10-CM | POA: Diagnosis not present

## 2022-03-27 DIAGNOSIS — R262 Difficulty in walking, not elsewhere classified: Secondary | ICD-10-CM | POA: Diagnosis not present

## 2022-03-27 DIAGNOSIS — I509 Heart failure, unspecified: Secondary | ICD-10-CM | POA: Diagnosis not present

## 2022-03-27 DIAGNOSIS — R2689 Other abnormalities of gait and mobility: Secondary | ICD-10-CM | POA: Diagnosis not present

## 2022-03-27 DIAGNOSIS — M6281 Muscle weakness (generalized): Secondary | ICD-10-CM | POA: Diagnosis not present

## 2022-03-30 DIAGNOSIS — I509 Heart failure, unspecified: Secondary | ICD-10-CM | POA: Diagnosis not present

## 2022-03-30 DIAGNOSIS — R262 Difficulty in walking, not elsewhere classified: Secondary | ICD-10-CM | POA: Diagnosis not present

## 2022-03-30 DIAGNOSIS — R2689 Other abnormalities of gait and mobility: Secondary | ICD-10-CM | POA: Diagnosis not present

## 2022-03-30 DIAGNOSIS — M6281 Muscle weakness (generalized): Secondary | ICD-10-CM | POA: Diagnosis not present

## 2022-04-02 DIAGNOSIS — R2689 Other abnormalities of gait and mobility: Secondary | ICD-10-CM | POA: Diagnosis not present

## 2022-04-02 DIAGNOSIS — I96 Gangrene, not elsewhere classified: Secondary | ICD-10-CM | POA: Diagnosis not present

## 2022-04-02 DIAGNOSIS — E1151 Type 2 diabetes mellitus with diabetic peripheral angiopathy without gangrene: Secondary | ICD-10-CM | POA: Diagnosis not present

## 2022-04-02 DIAGNOSIS — I10 Essential (primary) hypertension: Secondary | ICD-10-CM | POA: Diagnosis not present

## 2022-04-02 DIAGNOSIS — R262 Difficulty in walking, not elsewhere classified: Secondary | ICD-10-CM | POA: Diagnosis not present

## 2022-04-02 DIAGNOSIS — I509 Heart failure, unspecified: Secondary | ICD-10-CM | POA: Diagnosis not present

## 2022-04-02 DIAGNOSIS — I4891 Unspecified atrial fibrillation: Secondary | ICD-10-CM | POA: Diagnosis not present

## 2022-04-02 DIAGNOSIS — M6281 Muscle weakness (generalized): Secondary | ICD-10-CM | POA: Diagnosis not present

## 2022-04-03 DIAGNOSIS — I509 Heart failure, unspecified: Secondary | ICD-10-CM | POA: Diagnosis not present

## 2022-04-03 DIAGNOSIS — M6281 Muscle weakness (generalized): Secondary | ICD-10-CM | POA: Diagnosis not present

## 2022-04-03 DIAGNOSIS — R2689 Other abnormalities of gait and mobility: Secondary | ICD-10-CM | POA: Diagnosis not present

## 2022-04-03 DIAGNOSIS — R262 Difficulty in walking, not elsewhere classified: Secondary | ICD-10-CM | POA: Diagnosis not present

## 2022-04-07 DIAGNOSIS — R2689 Other abnormalities of gait and mobility: Secondary | ICD-10-CM | POA: Diagnosis not present

## 2022-04-07 DIAGNOSIS — R262 Difficulty in walking, not elsewhere classified: Secondary | ICD-10-CM | POA: Diagnosis not present

## 2022-04-07 DIAGNOSIS — I509 Heart failure, unspecified: Secondary | ICD-10-CM | POA: Diagnosis not present

## 2022-04-07 DIAGNOSIS — M6281 Muscle weakness (generalized): Secondary | ICD-10-CM | POA: Diagnosis not present

## 2022-04-08 DIAGNOSIS — I4891 Unspecified atrial fibrillation: Secondary | ICD-10-CM | POA: Diagnosis not present

## 2022-04-08 DIAGNOSIS — I5030 Unspecified diastolic (congestive) heart failure: Secondary | ICD-10-CM | POA: Diagnosis not present

## 2022-04-08 DIAGNOSIS — E1151 Type 2 diabetes mellitus with diabetic peripheral angiopathy without gangrene: Secondary | ICD-10-CM | POA: Diagnosis not present

## 2022-04-08 DIAGNOSIS — D638 Anemia in other chronic diseases classified elsewhere: Secondary | ICD-10-CM | POA: Diagnosis not present

## 2022-04-08 DIAGNOSIS — I10 Essential (primary) hypertension: Secondary | ICD-10-CM | POA: Diagnosis not present

## 2022-04-09 DIAGNOSIS — M6281 Muscle weakness (generalized): Secondary | ICD-10-CM | POA: Diagnosis not present

## 2022-04-09 DIAGNOSIS — R2689 Other abnormalities of gait and mobility: Secondary | ICD-10-CM | POA: Diagnosis not present

## 2022-04-09 DIAGNOSIS — I509 Heart failure, unspecified: Secondary | ICD-10-CM | POA: Diagnosis not present

## 2022-04-09 DIAGNOSIS — R262 Difficulty in walking, not elsewhere classified: Secondary | ICD-10-CM | POA: Diagnosis not present

## 2022-04-10 DIAGNOSIS — R262 Difficulty in walking, not elsewhere classified: Secondary | ICD-10-CM | POA: Diagnosis not present

## 2022-04-10 DIAGNOSIS — M6281 Muscle weakness (generalized): Secondary | ICD-10-CM | POA: Diagnosis not present

## 2022-04-10 DIAGNOSIS — I509 Heart failure, unspecified: Secondary | ICD-10-CM | POA: Diagnosis not present

## 2022-04-10 DIAGNOSIS — R2689 Other abnormalities of gait and mobility: Secondary | ICD-10-CM | POA: Diagnosis not present

## 2022-04-13 DIAGNOSIS — I509 Heart failure, unspecified: Secondary | ICD-10-CM | POA: Diagnosis not present

## 2022-04-13 DIAGNOSIS — E119 Type 2 diabetes mellitus without complications: Secondary | ICD-10-CM | POA: Diagnosis not present

## 2022-04-13 DIAGNOSIS — R2689 Other abnormalities of gait and mobility: Secondary | ICD-10-CM | POA: Diagnosis not present

## 2022-04-13 DIAGNOSIS — I10 Essential (primary) hypertension: Secondary | ICD-10-CM | POA: Diagnosis not present

## 2022-04-13 DIAGNOSIS — R262 Difficulty in walking, not elsewhere classified: Secondary | ICD-10-CM | POA: Diagnosis not present

## 2022-04-13 DIAGNOSIS — M6281 Muscle weakness (generalized): Secondary | ICD-10-CM | POA: Diagnosis not present

## 2022-04-17 DIAGNOSIS — R262 Difficulty in walking, not elsewhere classified: Secondary | ICD-10-CM | POA: Diagnosis not present

## 2022-04-17 DIAGNOSIS — I509 Heart failure, unspecified: Secondary | ICD-10-CM | POA: Diagnosis not present

## 2022-04-17 DIAGNOSIS — R2689 Other abnormalities of gait and mobility: Secondary | ICD-10-CM | POA: Diagnosis not present

## 2022-04-17 DIAGNOSIS — M6281 Muscle weakness (generalized): Secondary | ICD-10-CM | POA: Diagnosis not present

## 2022-04-21 DIAGNOSIS — R2689 Other abnormalities of gait and mobility: Secondary | ICD-10-CM | POA: Diagnosis not present

## 2022-04-21 DIAGNOSIS — I509 Heart failure, unspecified: Secondary | ICD-10-CM | POA: Diagnosis not present

## 2022-04-21 DIAGNOSIS — M6281 Muscle weakness (generalized): Secondary | ICD-10-CM | POA: Diagnosis not present

## 2022-04-21 DIAGNOSIS — R262 Difficulty in walking, not elsewhere classified: Secondary | ICD-10-CM | POA: Diagnosis not present

## 2022-04-22 DIAGNOSIS — I10 Essential (primary) hypertension: Secondary | ICD-10-CM | POA: Diagnosis not present

## 2022-04-23 DIAGNOSIS — I4891 Unspecified atrial fibrillation: Secondary | ICD-10-CM | POA: Diagnosis not present

## 2022-04-23 DIAGNOSIS — M6281 Muscle weakness (generalized): Secondary | ICD-10-CM | POA: Diagnosis not present

## 2022-04-23 DIAGNOSIS — I96 Gangrene, not elsewhere classified: Secondary | ICD-10-CM | POA: Diagnosis not present

## 2022-04-23 DIAGNOSIS — E1151 Type 2 diabetes mellitus with diabetic peripheral angiopathy without gangrene: Secondary | ICD-10-CM | POA: Diagnosis not present

## 2022-04-23 DIAGNOSIS — I509 Heart failure, unspecified: Secondary | ICD-10-CM | POA: Diagnosis not present

## 2022-04-23 DIAGNOSIS — R262 Difficulty in walking, not elsewhere classified: Secondary | ICD-10-CM | POA: Diagnosis not present

## 2022-04-23 DIAGNOSIS — I10 Essential (primary) hypertension: Secondary | ICD-10-CM | POA: Diagnosis not present

## 2022-04-23 DIAGNOSIS — R2689 Other abnormalities of gait and mobility: Secondary | ICD-10-CM | POA: Diagnosis not present

## 2022-04-24 ENCOUNTER — Ambulatory Visit (INDEPENDENT_AMBULATORY_CARE_PROVIDER_SITE_OTHER): Payer: Medicare Other | Admitting: Podiatry

## 2022-04-24 ENCOUNTER — Encounter: Payer: Self-pay | Admitting: Podiatry

## 2022-04-24 DIAGNOSIS — R2689 Other abnormalities of gait and mobility: Secondary | ICD-10-CM | POA: Diagnosis not present

## 2022-04-24 DIAGNOSIS — B351 Tinea unguium: Secondary | ICD-10-CM | POA: Diagnosis not present

## 2022-04-24 DIAGNOSIS — L853 Xerosis cutis: Secondary | ICD-10-CM | POA: Diagnosis not present

## 2022-04-24 DIAGNOSIS — I509 Heart failure, unspecified: Secondary | ICD-10-CM | POA: Diagnosis not present

## 2022-04-24 DIAGNOSIS — M79674 Pain in right toe(s): Secondary | ICD-10-CM | POA: Diagnosis not present

## 2022-04-24 DIAGNOSIS — E1159 Type 2 diabetes mellitus with other circulatory complications: Secondary | ICD-10-CM

## 2022-04-24 DIAGNOSIS — M79675 Pain in left toe(s): Secondary | ICD-10-CM | POA: Diagnosis not present

## 2022-04-24 DIAGNOSIS — M6281 Muscle weakness (generalized): Secondary | ICD-10-CM | POA: Diagnosis not present

## 2022-04-24 DIAGNOSIS — R262 Difficulty in walking, not elsewhere classified: Secondary | ICD-10-CM | POA: Diagnosis not present

## 2022-04-24 NOTE — Patient Instructions (Signed)
Call [facility] to determine when the doctor would like to see her for follow up of stents in both legs.

## 2022-04-27 DIAGNOSIS — R2689 Other abnormalities of gait and mobility: Secondary | ICD-10-CM | POA: Diagnosis not present

## 2022-04-27 DIAGNOSIS — R262 Difficulty in walking, not elsewhere classified: Secondary | ICD-10-CM | POA: Diagnosis not present

## 2022-04-27 DIAGNOSIS — I509 Heart failure, unspecified: Secondary | ICD-10-CM | POA: Diagnosis not present

## 2022-04-27 DIAGNOSIS — M6281 Muscle weakness (generalized): Secondary | ICD-10-CM | POA: Diagnosis not present

## 2022-04-30 DIAGNOSIS — I10 Essential (primary) hypertension: Secondary | ICD-10-CM | POA: Diagnosis not present

## 2022-04-30 DIAGNOSIS — E1151 Type 2 diabetes mellitus with diabetic peripheral angiopathy without gangrene: Secondary | ICD-10-CM | POA: Diagnosis not present

## 2022-04-30 DIAGNOSIS — I5030 Unspecified diastolic (congestive) heart failure: Secondary | ICD-10-CM | POA: Diagnosis not present

## 2022-04-30 DIAGNOSIS — I4891 Unspecified atrial fibrillation: Secondary | ICD-10-CM | POA: Diagnosis not present

## 2022-05-01 DIAGNOSIS — I509 Heart failure, unspecified: Secondary | ICD-10-CM | POA: Diagnosis not present

## 2022-05-01 DIAGNOSIS — R262 Difficulty in walking, not elsewhere classified: Secondary | ICD-10-CM | POA: Diagnosis not present

## 2022-05-01 DIAGNOSIS — R2689 Other abnormalities of gait and mobility: Secondary | ICD-10-CM | POA: Diagnosis not present

## 2022-05-01 DIAGNOSIS — M6281 Muscle weakness (generalized): Secondary | ICD-10-CM | POA: Diagnosis not present

## 2022-05-03 NOTE — Progress Notes (Signed)
  Subjective:  Patient ID: [name], female    DOB: October 24, 1966,  MRN: [id number]  KALISSA GRAYS presents to clinic today for at risk foot care. Pt has h/o NIDDM with PAD and painful elongated mycotic toenails 1-5 bilaterally which are tender when wearing enclosed shoe gear. Pain is relieved with periodic professional debridement.  Last known HgA1c was unknown.  Patient did not check blood glucose today..  She is accompanied by her daughter, her sister and a caregiver from [place]. Daughter came from her job and initially thought she had an appointment at our clinic, but it was indeed her mother who has the appointment on today.  PCP is [name], MD , and last visit was  2022.  Patient is a resident at [facility].   She has h/o PAD and recently recovered from multiple LE wounds which were limb threatening. Thankfully, they have healed.  Allergies  Allergen Reactions   Sodium Hypochlorite Anaphylaxis    (Bleach wipes) "I am not able to breathe"    Review of Systems: Negative except as noted in the HPI.  Objective: No changes noted in today's physical examination.  Vascular Examination: CFT <4 seconds b/l. DP pulses diminished b/l. PT pulses diminished b/l. Digital hair absent. Skin temperature gradient warm to cool b/l. No ischemia or gangrene. No cyanosis or clubbing noted b/l. No edema noted b/l LE.   Neurological Examination: Sensation grossly intact b/l with 10 gram monofilament. Vibratory sensation intact b/l.   Dermatological Examination: Pedal skin thin, shiny and atrophic b/l. Toenails 1-5 b/l thick, discolored, elongated with subungual debris and pain on dorsal palpation.   Scarring noted both LE from previous wounds. No open wounds b/l lower extremities. No interdigital macerations noted b/l LE. Pedal skin noted to be dry b/l lower extremities.  Musculoskeletal Examination: Muscle strength 4/5 to all lower extremity muscle groups bilaterally.  Charcot deformity noted b/l lower extremities. Utilizes wheelchair for mobility assistance.  Radiographs: None    Latest Ref Rng & Units 07/01/2021    5:18 PM  Hemoglobin A1C  Hemoglobin-A1c 4.8 - 5.6 % 6.1    Assessment/Plan: 1. Pain due to onychomycosis of toenails of both feet   2. Xerosis cutis   3. Type 2 diabetes mellitus with vascular disease ([place])     -Patient was evaluated and treated. All patient's and/or POA's questions/concerns answered on today's visit. -Order written for facility to apply ammonium lactate lotion to feet once daily avoiding application between toes. -Continue foot and shoe inspections daily. Monitor blood glucose per PCP/Endocrinologist's recommendations. -Patient to continue soft, supportive shoe gear daily. -Mycotic toenails 1-5 bilaterally were debrided in length and girth with sterile nail nippers and dremel without incident. -She has been followed by Vascular Surgery team at [facility]. She needs her annual follow up. -Patient/POA to call should there be question/concern in the interim.   Return in about 3 months (around 07/25/2022).  Marzetta Board, DPM

## 2022-05-06 DIAGNOSIS — R262 Difficulty in walking, not elsewhere classified: Secondary | ICD-10-CM | POA: Diagnosis not present

## 2022-05-06 DIAGNOSIS — I509 Heart failure, unspecified: Secondary | ICD-10-CM | POA: Diagnosis not present

## 2022-05-06 DIAGNOSIS — M6281 Muscle weakness (generalized): Secondary | ICD-10-CM | POA: Diagnosis not present

## 2022-05-06 DIAGNOSIS — R2689 Other abnormalities of gait and mobility: Secondary | ICD-10-CM | POA: Diagnosis not present

## 2022-05-08 DIAGNOSIS — I5030 Unspecified diastolic (congestive) heart failure: Secondary | ICD-10-CM | POA: Diagnosis not present

## 2022-05-08 DIAGNOSIS — I4891 Unspecified atrial fibrillation: Secondary | ICD-10-CM | POA: Diagnosis not present

## 2022-05-08 DIAGNOSIS — D638 Anemia in other chronic diseases classified elsewhere: Secondary | ICD-10-CM | POA: Diagnosis not present

## 2022-05-08 DIAGNOSIS — E1151 Type 2 diabetes mellitus with diabetic peripheral angiopathy without gangrene: Secondary | ICD-10-CM | POA: Diagnosis not present

## 2022-05-08 DIAGNOSIS — I10 Essential (primary) hypertension: Secondary | ICD-10-CM | POA: Diagnosis not present

## 2022-05-13 DIAGNOSIS — H25813 Combined forms of age-related cataract, bilateral: Secondary | ICD-10-CM | POA: Diagnosis not present

## 2022-05-13 DIAGNOSIS — I10 Essential (primary) hypertension: Secondary | ICD-10-CM | POA: Diagnosis not present

## 2022-05-13 DIAGNOSIS — H5203 Hypermetropia, bilateral: Secondary | ICD-10-CM | POA: Diagnosis not present

## 2022-05-13 DIAGNOSIS — E119 Type 2 diabetes mellitus without complications: Secondary | ICD-10-CM | POA: Diagnosis not present

## 2022-05-14 DIAGNOSIS — M6281 Muscle weakness (generalized): Secondary | ICD-10-CM | POA: Diagnosis not present

## 2022-05-14 DIAGNOSIS — R2689 Other abnormalities of gait and mobility: Secondary | ICD-10-CM | POA: Diagnosis not present

## 2022-05-14 DIAGNOSIS — R262 Difficulty in walking, not elsewhere classified: Secondary | ICD-10-CM | POA: Diagnosis not present

## 2022-05-14 DIAGNOSIS — I509 Heart failure, unspecified: Secondary | ICD-10-CM | POA: Diagnosis not present

## 2022-05-20 DIAGNOSIS — R2689 Other abnormalities of gait and mobility: Secondary | ICD-10-CM | POA: Diagnosis not present

## 2022-05-20 DIAGNOSIS — I509 Heart failure, unspecified: Secondary | ICD-10-CM | POA: Diagnosis not present

## 2022-05-20 DIAGNOSIS — M6281 Muscle weakness (generalized): Secondary | ICD-10-CM | POA: Diagnosis not present

## 2022-05-20 DIAGNOSIS — R262 Difficulty in walking, not elsewhere classified: Secondary | ICD-10-CM | POA: Diagnosis not present

## 2022-05-22 DIAGNOSIS — I509 Heart failure, unspecified: Secondary | ICD-10-CM | POA: Diagnosis not present

## 2022-05-22 DIAGNOSIS — R262 Difficulty in walking, not elsewhere classified: Secondary | ICD-10-CM | POA: Diagnosis not present

## 2022-05-22 DIAGNOSIS — R2689 Other abnormalities of gait and mobility: Secondary | ICD-10-CM | POA: Diagnosis not present

## 2022-05-22 DIAGNOSIS — M6281 Muscle weakness (generalized): Secondary | ICD-10-CM | POA: Diagnosis not present

## 2022-05-22 IMAGING — DX DG CHEST 1V PORT
1 series · 1 of 1 positions shown · non-contrast
Comparison: August 19, 2020.

CLINICAL DATA: Questionable sepsis.

EXAM:
PORTABLE CHEST 1 VIEW

[chest ap]
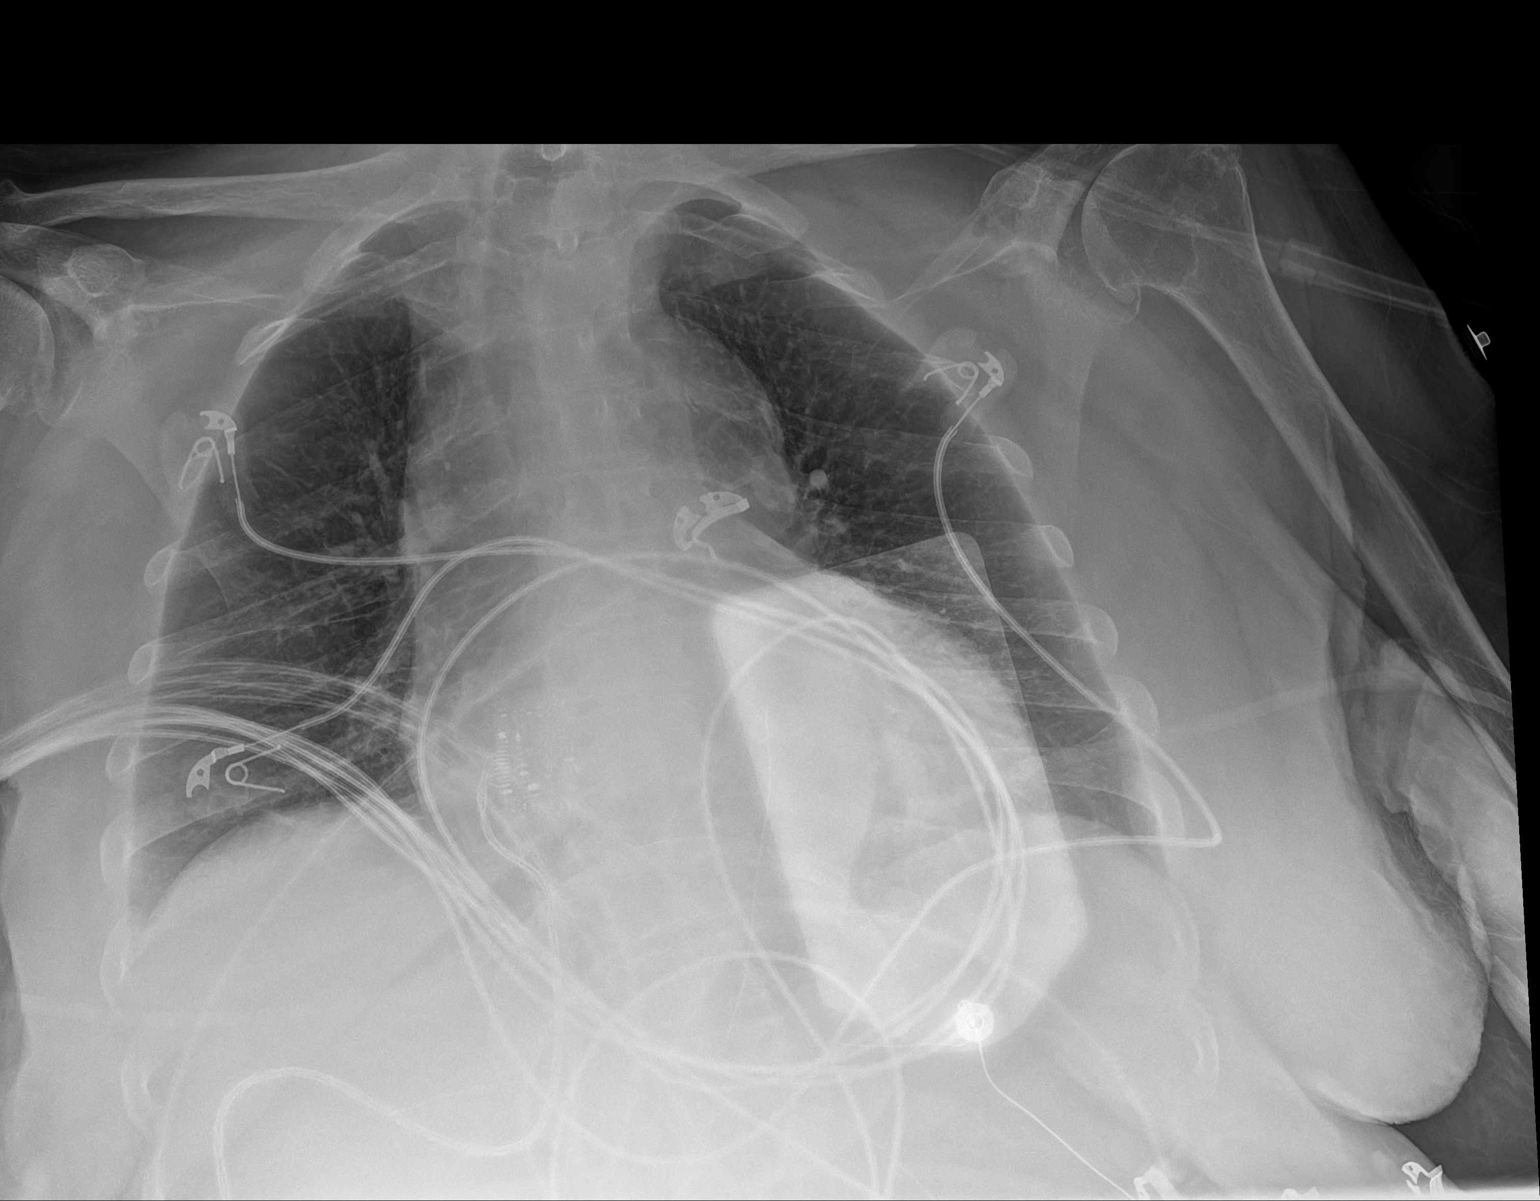

[1 of 1 positions shown; findings below may reference images not displayed]

FINDINGS: Enlarged cardiac silhouette, similar to prior. No confluent
consolidation. Streaky left basilar opacity. No visible pleural
effusions or pneumothorax on this semi upright portable radiograph.
No acute osseous abnormality.
IMPRESSION: Streaky left basilar opacity, favored to reflect atelectasis. No
confluent consolidation.

## 2022-05-22 IMAGING — CT CT ABD-PELV W/O CM
2 of 4 series · 17 of 46 positions shown, 19 images · non-contrast
Comparison: Radiograph 08/21/2020, CT 07/12/2018

CLINICAL DATA: Nausea vomiting

EXAM:
CT ABDOMEN AND PELVIS WITHOUT CONTRAST
TECHNIQUE: Multidetector CT imaging of the abdomen and pelvis was performed
following the standard protocol without IV contrast.

[Series 2: axial st · axial · 0.78mm/px · z∈[+772,+1156]mm · 14 of 87 slices shown, 16 images]
[im 5/87  soft-tissue]
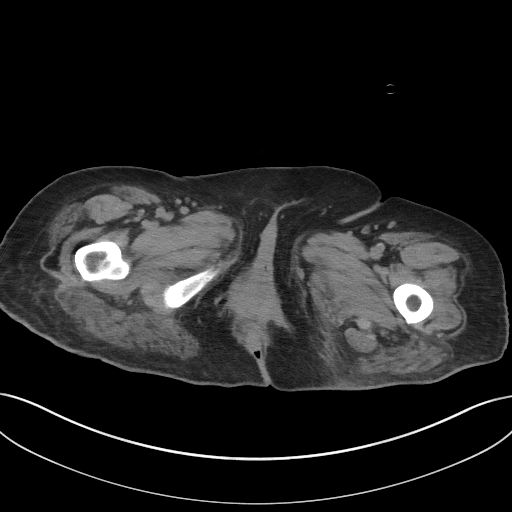
[im 5/87  bone]
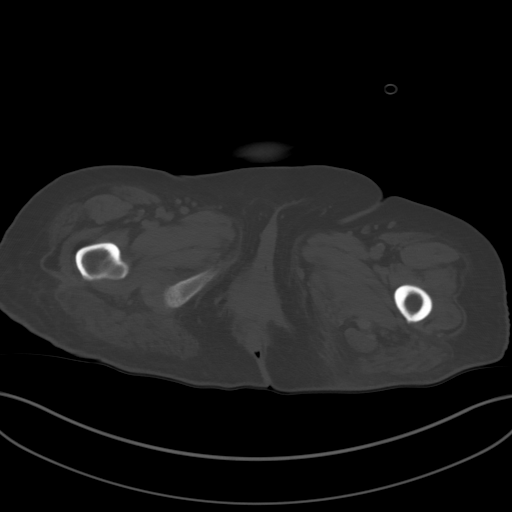
[im 10/87  soft-tissue]
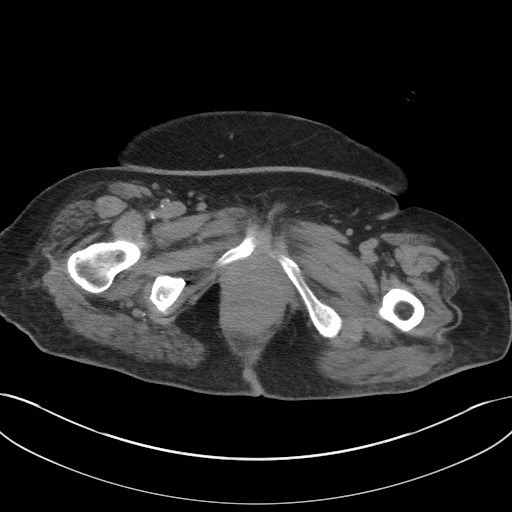
[im 20/87  soft-tissue]
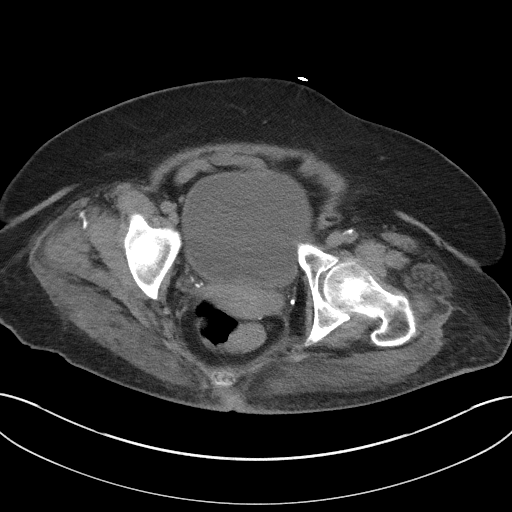
[im 24/87  soft-tissue]
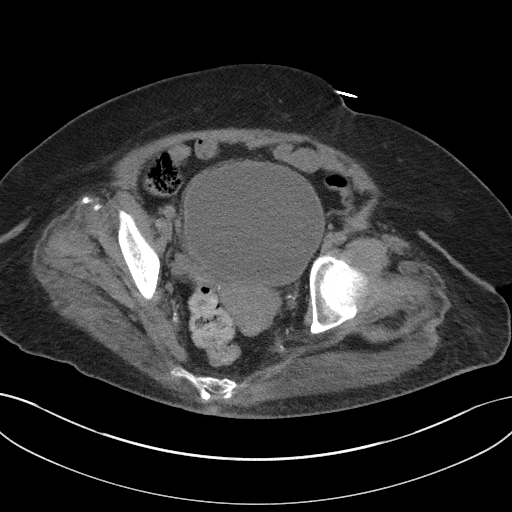
[im 29/87  soft-tissue]
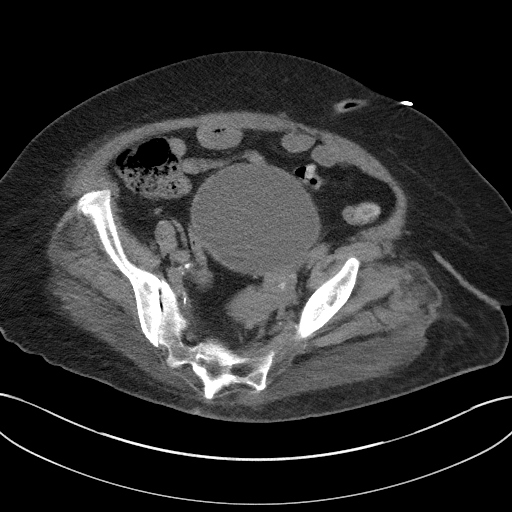
[im 34/87  soft-tissue]
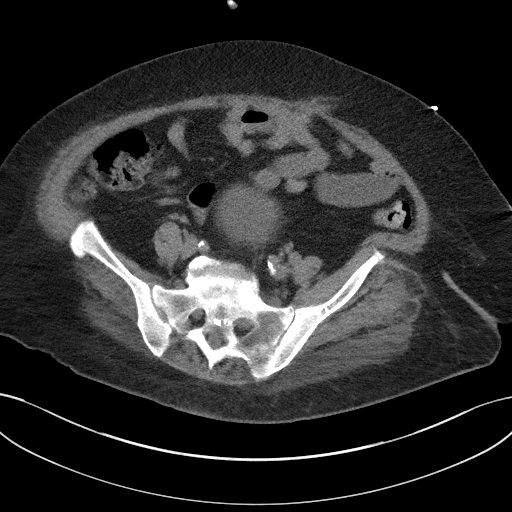
[im 39/87  soft-tissue]
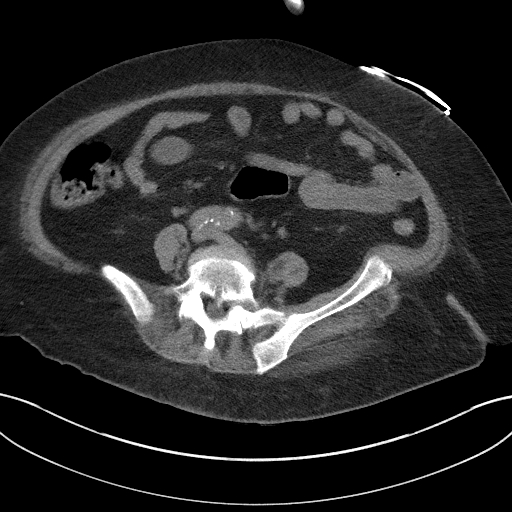
[im 48/87  soft-tissue]
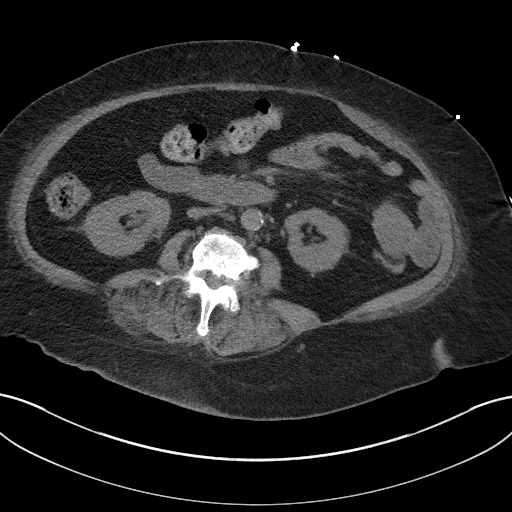
[im 53/87  soft-tissue]
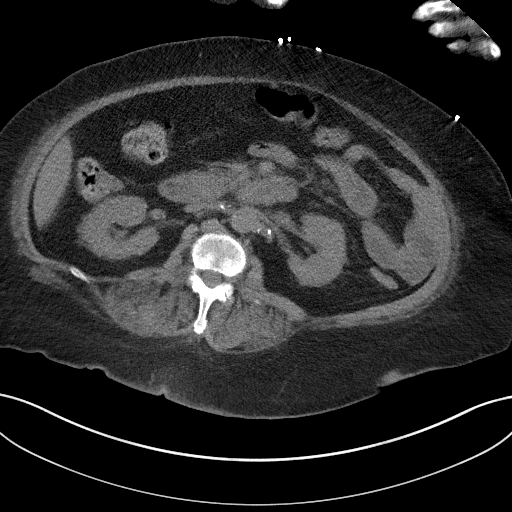
[im 53/87  bone]
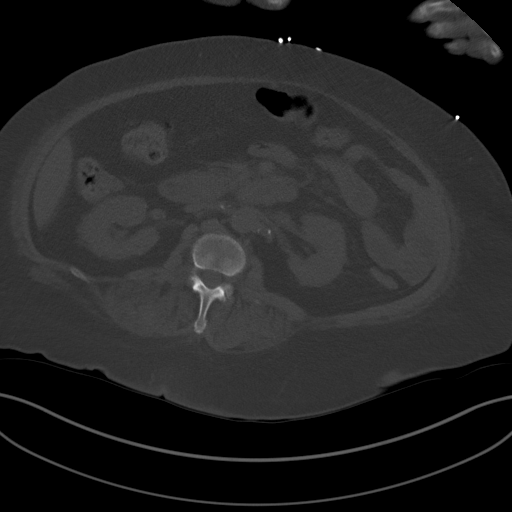
[im 58/87  soft-tissue]
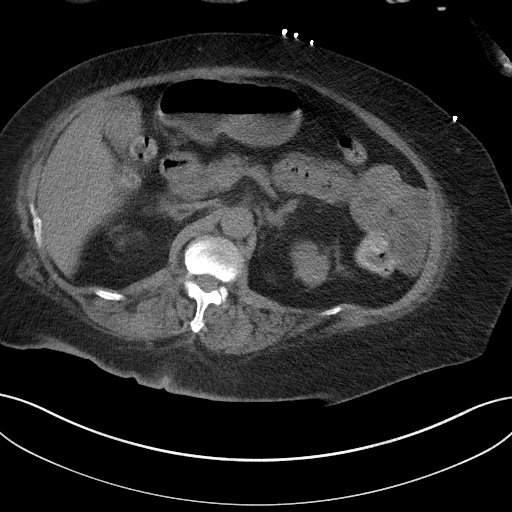
[im 63/87  soft-tissue]
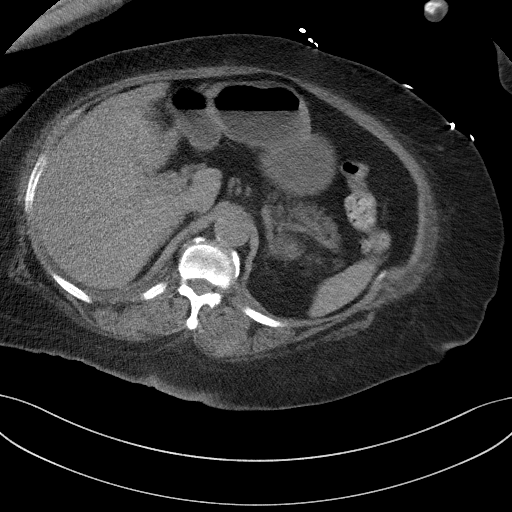
[im 67/87  soft-tissue]
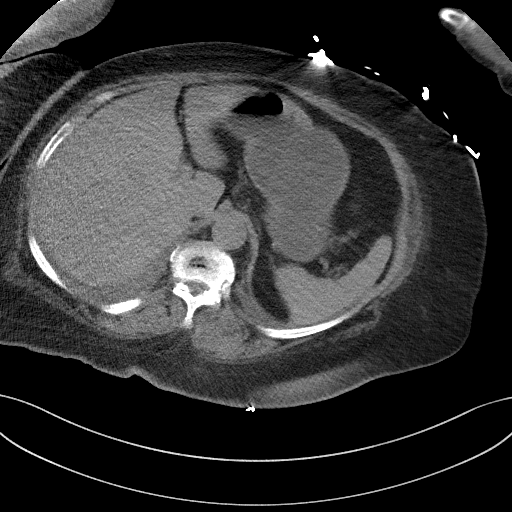
[im 77/87  soft-tissue]
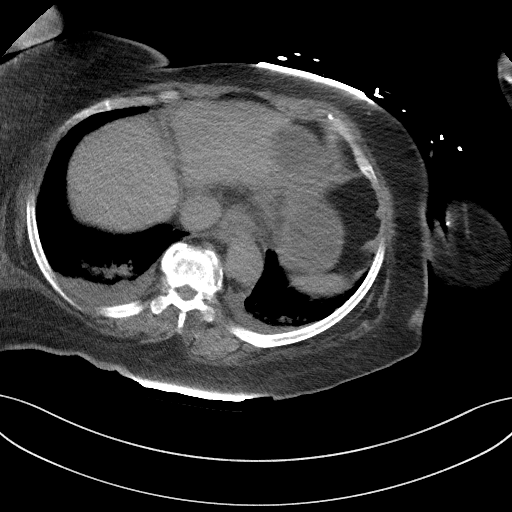
[im 82/87  soft-tissue]
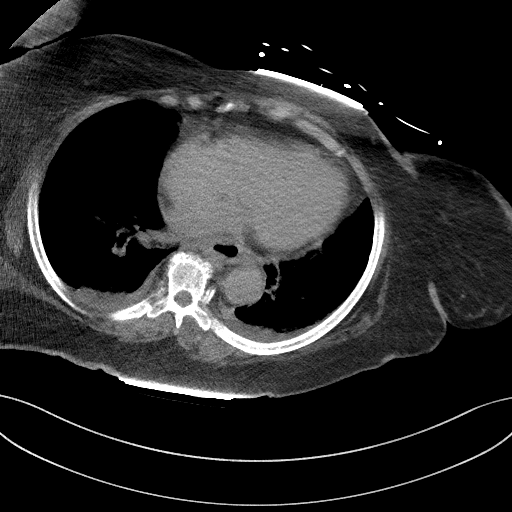

[Series 5: coronal st · coronal · 0.85mm/px · 3 of 101 slices shown]
[im 34/101  soft-tissue]
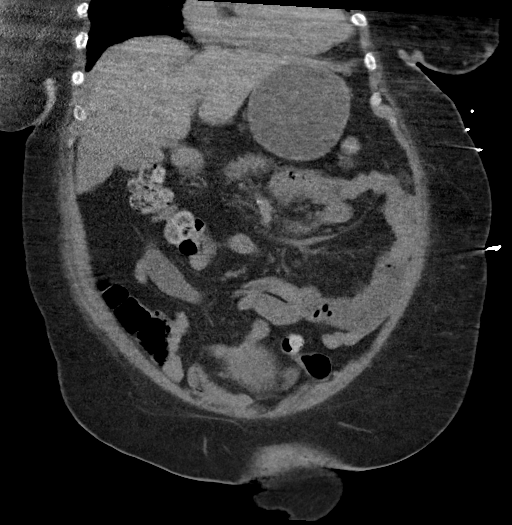
[im 45/101  soft-tissue]
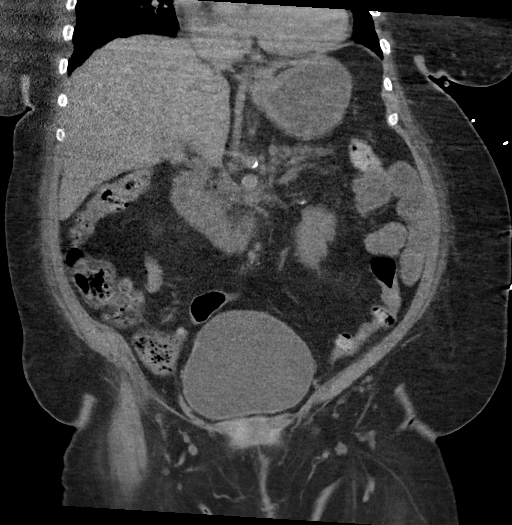
[im 56/101  soft-tissue]
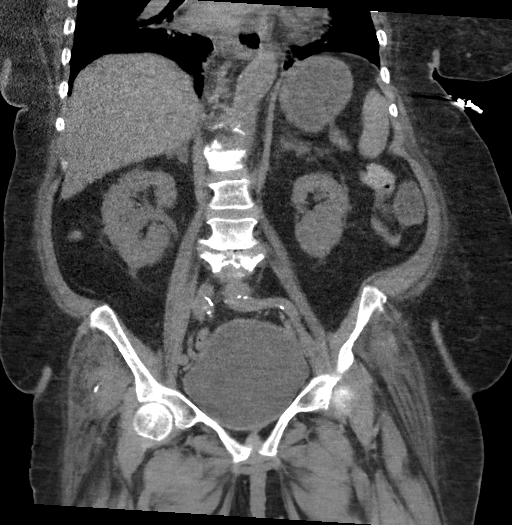

[17 of 46 positions shown; findings below may reference images not displayed]

FINDINGS: Lower chest: Lung bases demonstrate small bilateral pleural
effusions. Hazy posterior lung bases likely atelectasis.
Cardiomegaly. Mild circumferential distal esophageal thickening.

Hepatobiliary: No focal liver abnormality is seen. No gallstones,
gallbladder wall thickening, or biliary dilatation.

Pancreas: Unremarkable. No pancreatic ductal dilatation or
surrounding inflammatory changes.

Spleen: Normal in size without focal abnormality.

Adrenals/Urinary Tract: Adrenal glands are within normal limits.
Kidneys show no hydronephrosis. Nonspecific perinephric fat
stranding. Markedly distended urinary bladder.

Stomach/Bowel: The stomach is nonenlarged. No dilated small bowel.
Negative appendix. No acute bowel wall thickening.

Vascular/Lymphatic: Moderate aortic atherosclerosis. No aneurysm. No
suspicious nodes.

Reproductive: Uterus is unremarkable.  No adnexal mass.

Other: Negative for free air or free fluid.

Musculoskeletal: Moderate superior endplate compression deformity at
L1 with about 40% loss of height of anterior vertebral body.
Degenerative changes most advanced at L3-L4 and L5-S1.
IMPRESSION: 1. No CT evidence for acute intra-abdominal or pelvic abnormality.
2. Mild distal esophageal thickening, question esophagitis or
reflux.
3. Small bilateral pleural effusions. Cardiomegaly.
4. Moderate superior endplate compression deformity at L1, of
uncertain age, new since 6915.

Aortic Atherosclerosis ([id number]).

## 2022-05-24 DIAGNOSIS — R262 Difficulty in walking, not elsewhere classified: Secondary | ICD-10-CM | POA: Diagnosis not present

## 2022-05-24 DIAGNOSIS — M6281 Muscle weakness (generalized): Secondary | ICD-10-CM | POA: Diagnosis not present

## 2022-05-24 DIAGNOSIS — R2689 Other abnormalities of gait and mobility: Secondary | ICD-10-CM | POA: Diagnosis not present

## 2022-05-24 DIAGNOSIS — I509 Heart failure, unspecified: Secondary | ICD-10-CM | POA: Diagnosis not present

## 2022-05-26 IMAGING — DX DG CHEST 1V PORT
1 series · 1 of 1 positions shown · non-contrast
Comparison: 10/29/2020

CLINICAL DATA: Chest pain when swallowing water

EXAM:
PORTABLE CHEST 1 VIEW

[chest ap]
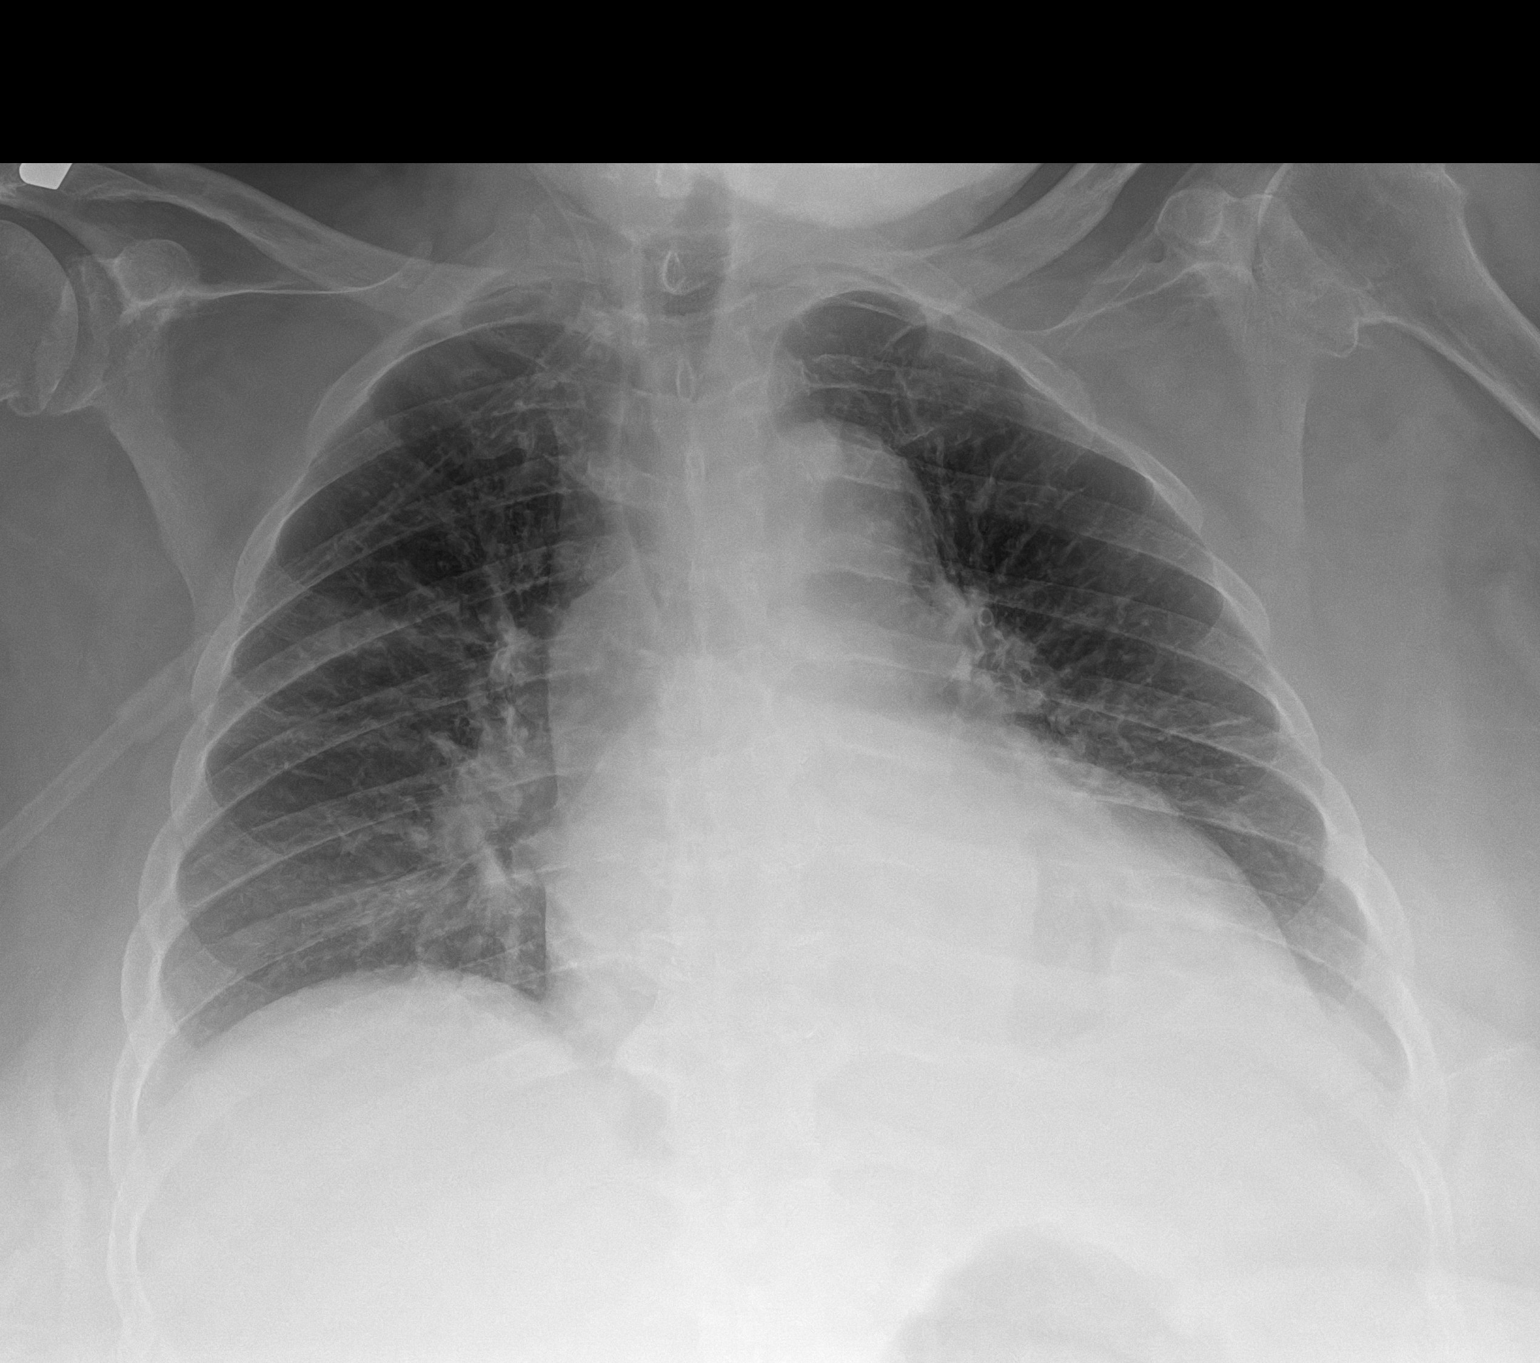

[1 of 1 positions shown; findings below may reference images not displayed]

FINDINGS: Cardiomegaly and aortic tortuosity. Stable lung markings. There is
no edema, consolidation, effusion, or pneumothorax.
IMPRESSION: 1. No acute finding when compared to prior.
2. Cardiomegaly.

## 2022-05-27 DIAGNOSIS — M6281 Muscle weakness (generalized): Secondary | ICD-10-CM | POA: Diagnosis not present

## 2022-05-27 DIAGNOSIS — R262 Difficulty in walking, not elsewhere classified: Secondary | ICD-10-CM | POA: Diagnosis not present

## 2022-05-27 DIAGNOSIS — R2689 Other abnormalities of gait and mobility: Secondary | ICD-10-CM | POA: Diagnosis not present

## 2022-05-27 DIAGNOSIS — I509 Heart failure, unspecified: Secondary | ICD-10-CM | POA: Diagnosis not present

## 2022-05-29 DIAGNOSIS — I5030 Unspecified diastolic (congestive) heart failure: Secondary | ICD-10-CM | POA: Diagnosis not present

## 2022-05-29 DIAGNOSIS — I4891 Unspecified atrial fibrillation: Secondary | ICD-10-CM | POA: Diagnosis not present

## 2022-05-29 DIAGNOSIS — E785 Hyperlipidemia, unspecified: Secondary | ICD-10-CM | POA: Diagnosis not present

## 2022-05-29 DIAGNOSIS — K219 Gastro-esophageal reflux disease without esophagitis: Secondary | ICD-10-CM | POA: Diagnosis not present

## 2022-05-29 DIAGNOSIS — N3281 Overactive bladder: Secondary | ICD-10-CM | POA: Diagnosis not present

## 2022-05-29 DIAGNOSIS — J449 Chronic obstructive pulmonary disease, unspecified: Secondary | ICD-10-CM | POA: Diagnosis not present

## 2022-05-29 DIAGNOSIS — I739 Peripheral vascular disease, unspecified: Secondary | ICD-10-CM | POA: Diagnosis not present

## 2022-05-31 DIAGNOSIS — R2689 Other abnormalities of gait and mobility: Secondary | ICD-10-CM | POA: Diagnosis not present

## 2022-05-31 DIAGNOSIS — M6281 Muscle weakness (generalized): Secondary | ICD-10-CM | POA: Diagnosis not present

## 2022-05-31 DIAGNOSIS — I509 Heart failure, unspecified: Secondary | ICD-10-CM | POA: Diagnosis not present

## 2022-05-31 DIAGNOSIS — R262 Difficulty in walking, not elsewhere classified: Secondary | ICD-10-CM | POA: Diagnosis not present

## 2022-06-02 DIAGNOSIS — I509 Heart failure, unspecified: Secondary | ICD-10-CM | POA: Diagnosis not present

## 2022-06-02 DIAGNOSIS — R262 Difficulty in walking, not elsewhere classified: Secondary | ICD-10-CM | POA: Diagnosis not present

## 2022-06-02 DIAGNOSIS — M6281 Muscle weakness (generalized): Secondary | ICD-10-CM | POA: Diagnosis not present

## 2022-06-02 DIAGNOSIS — R2689 Other abnormalities of gait and mobility: Secondary | ICD-10-CM | POA: Diagnosis not present

## 2022-06-04 DIAGNOSIS — J449 Chronic obstructive pulmonary disease, unspecified: Secondary | ICD-10-CM | POA: Diagnosis not present

## 2022-06-04 DIAGNOSIS — I5030 Unspecified diastolic (congestive) heart failure: Secondary | ICD-10-CM | POA: Diagnosis not present

## 2022-06-04 DIAGNOSIS — E785 Hyperlipidemia, unspecified: Secondary | ICD-10-CM | POA: Diagnosis not present

## 2022-06-04 DIAGNOSIS — H524 Presbyopia: Secondary | ICD-10-CM | POA: Diagnosis not present

## 2022-06-04 DIAGNOSIS — K219 Gastro-esophageal reflux disease without esophagitis: Secondary | ICD-10-CM | POA: Diagnosis not present

## 2022-06-04 DIAGNOSIS — I10 Essential (primary) hypertension: Secondary | ICD-10-CM | POA: Diagnosis not present

## 2022-06-04 DIAGNOSIS — I739 Peripheral vascular disease, unspecified: Secondary | ICD-10-CM | POA: Diagnosis not present

## 2022-06-04 DIAGNOSIS — H25043 Posterior subcapsular polar age-related cataract, bilateral: Secondary | ICD-10-CM | POA: Diagnosis not present

## 2022-06-04 DIAGNOSIS — I4891 Unspecified atrial fibrillation: Secondary | ICD-10-CM | POA: Diagnosis not present

## 2022-06-04 DIAGNOSIS — D638 Anemia in other chronic diseases classified elsewhere: Secondary | ICD-10-CM | POA: Diagnosis not present

## 2022-06-04 DIAGNOSIS — N3281 Overactive bladder: Secondary | ICD-10-CM | POA: Diagnosis not present

## 2022-06-05 DIAGNOSIS — E119 Type 2 diabetes mellitus without complications: Secondary | ICD-10-CM | POA: Diagnosis not present

## 2022-06-05 DIAGNOSIS — I10 Essential (primary) hypertension: Secondary | ICD-10-CM | POA: Diagnosis not present

## 2022-06-10 DIAGNOSIS — H2521 Age-related cataract, morgagnian type, right eye: Secondary | ICD-10-CM | POA: Diagnosis not present

## 2022-06-10 DIAGNOSIS — H2511 Age-related nuclear cataract, right eye: Secondary | ICD-10-CM | POA: Diagnosis not present

## 2022-06-10 DIAGNOSIS — H269 Unspecified cataract: Secondary | ICD-10-CM | POA: Diagnosis not present

## 2022-06-11 DIAGNOSIS — R2689 Other abnormalities of gait and mobility: Secondary | ICD-10-CM | POA: Diagnosis not present

## 2022-06-11 DIAGNOSIS — M6281 Muscle weakness (generalized): Secondary | ICD-10-CM | POA: Diagnosis not present

## 2022-06-11 DIAGNOSIS — R262 Difficulty in walking, not elsewhere classified: Secondary | ICD-10-CM | POA: Diagnosis not present

## 2022-06-11 DIAGNOSIS — I509 Heart failure, unspecified: Secondary | ICD-10-CM | POA: Diagnosis not present

## 2022-06-17 DIAGNOSIS — R262 Difficulty in walking, not elsewhere classified: Secondary | ICD-10-CM | POA: Diagnosis not present

## 2022-06-17 DIAGNOSIS — I509 Heart failure, unspecified: Secondary | ICD-10-CM | POA: Diagnosis not present

## 2022-06-17 DIAGNOSIS — M6281 Muscle weakness (generalized): Secondary | ICD-10-CM | POA: Diagnosis not present

## 2022-06-17 DIAGNOSIS — R2689 Other abnormalities of gait and mobility: Secondary | ICD-10-CM | POA: Diagnosis not present

## 2022-06-20 DIAGNOSIS — D638 Anemia in other chronic diseases classified elsewhere: Secondary | ICD-10-CM | POA: Diagnosis not present

## 2022-06-20 DIAGNOSIS — J449 Chronic obstructive pulmonary disease, unspecified: Secondary | ICD-10-CM | POA: Diagnosis not present

## 2022-06-20 DIAGNOSIS — E1151 Type 2 diabetes mellitus with diabetic peripheral angiopathy without gangrene: Secondary | ICD-10-CM | POA: Diagnosis not present

## 2022-06-20 DIAGNOSIS — I4891 Unspecified atrial fibrillation: Secondary | ICD-10-CM | POA: Diagnosis not present

## 2022-06-20 DIAGNOSIS — E785 Hyperlipidemia, unspecified: Secondary | ICD-10-CM | POA: Diagnosis not present

## 2022-06-20 DIAGNOSIS — I5030 Unspecified diastolic (congestive) heart failure: Secondary | ICD-10-CM | POA: Diagnosis not present

## 2022-06-20 DIAGNOSIS — I10 Essential (primary) hypertension: Secondary | ICD-10-CM | POA: Diagnosis not present

## 2022-06-26 IMAGING — DX DG CHEST 1V PORT
1 series · 1 of 1 positions shown · non-contrast
Comparison: 11/02/2020

CLINICAL DATA: Shortness of breath

EXAM:
PORTABLE CHEST 1 VIEW

[chest ap]
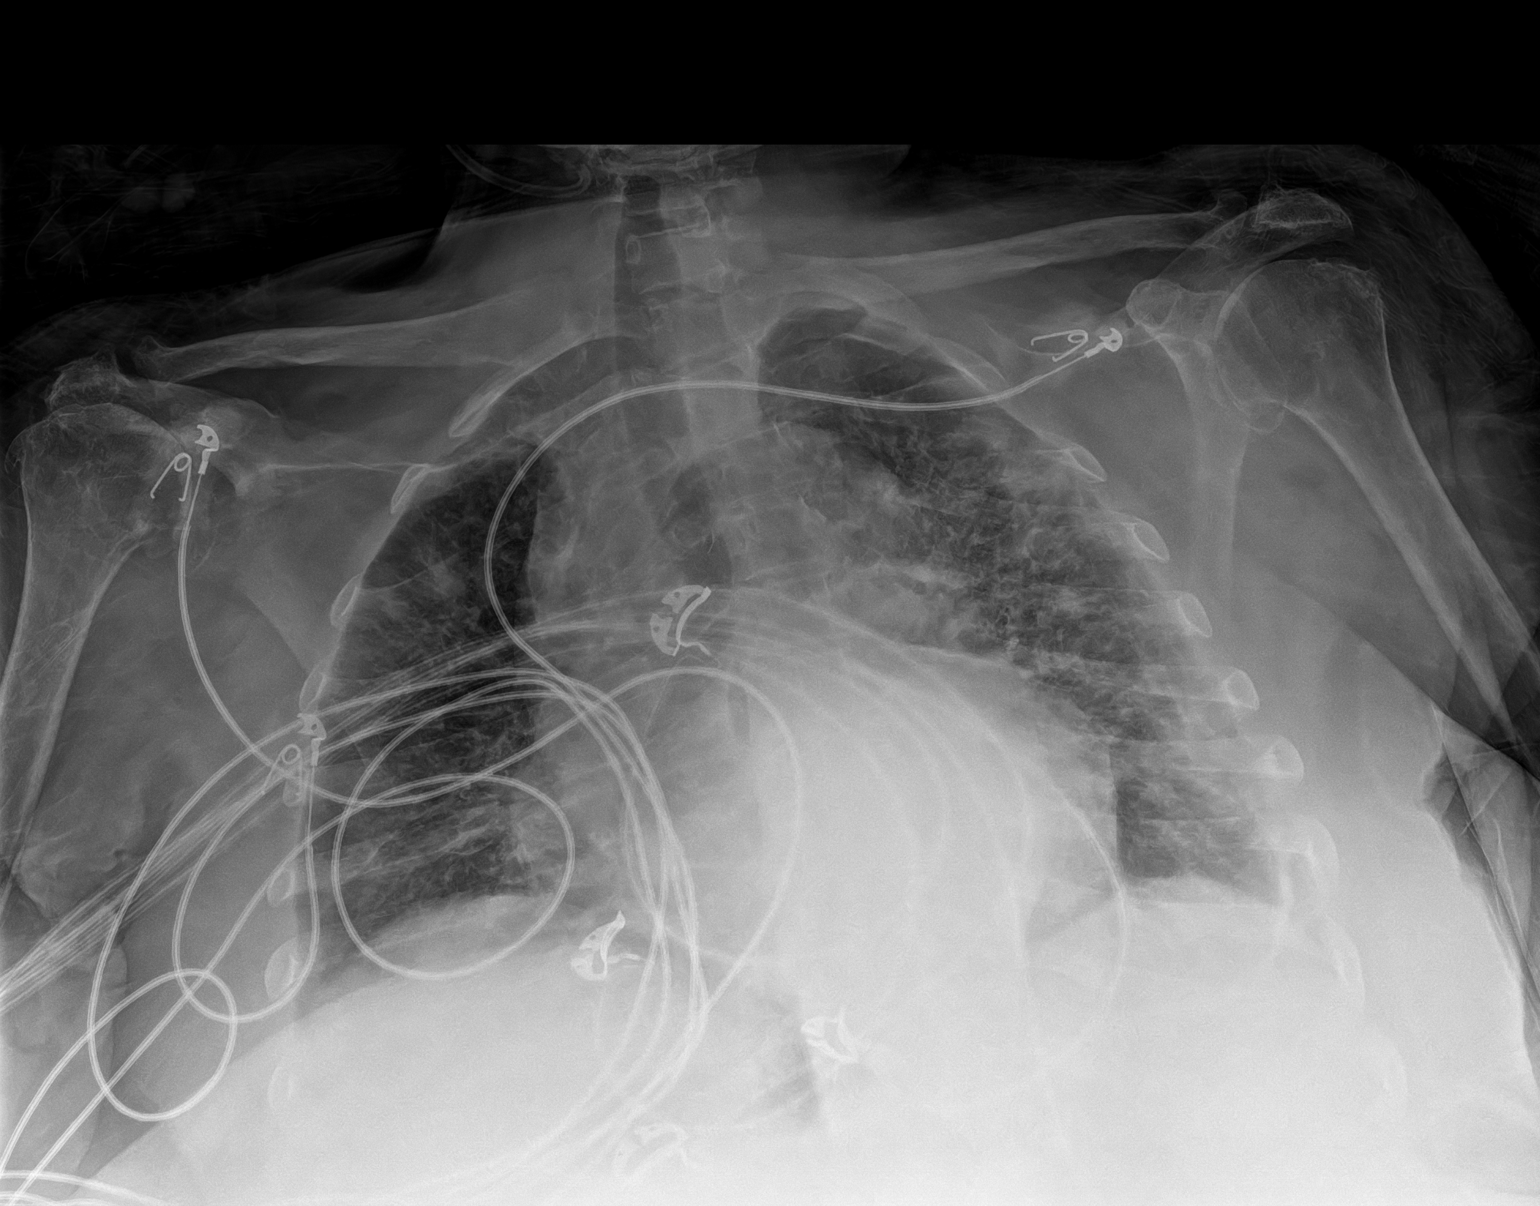

[1 of 1 positions shown; findings below may reference images not displayed]

FINDINGS: Bilateral interstitial and patchy alveolar airspace opacities. No
pleural effusion or pneumothorax. Stable cardiomegaly. No acute
osseous abnormality.
IMPRESSION: Bilateral interstitial and patchy alveolar airspace opacities
concerning for multilobar pneumonia including atypical viral
pneumonia.

## 2022-06-26 NOTE — Progress Notes (Deleted)
HISTORY AND PHYSICAL     CC:  follow up. Requesting Provider:  [name], MD  HPI: This is a 56 y.o. female who is here today for follow up for PAD.  Pt has hx of angiogram with right SFA and popliteal artery stenting on 04/23/2021 by Dr. Stanford Breed for non healing wounds.  She subsequently underwent angiogram with attention to the LLE on 04/30/2022 by Dr. Stanford Breed with findings on inline flow to the foot and continued local wound care was recommended with continued best medical therapy (asa/statin).  Pt was last seen 09/02/2021 and at that time, her wounds on the left ankle were improving and nearly healed.  She was having pain around the area of the wound on the right Achilles.  She had easily palpable DP pulses bilaterally and duplex revealed patent right SFA without significant stenosis.  Despite physical exam and arterial duplex findings patient had a worsening right ankle wound with exposed Achilles tendon.  Case was discussed with Dr. Stanford Breed and he felt patient was optimized from a vascular surgery standpoint.  Patient was offered a right leg amputation however she was adamantly against this.  Patient was made aware that if this were to become infected this could potentially take her life.  She was to call our office if she changed her mind about amputation and would otherwise f/u with routine surveillance.   She was residing in SNF.  The pt returns today for follow up.  ***  The pt is on a statin for cholesterol management.    The pt is on an aspirin.    Other AC:  Eliquis The pt is on CCB, BB for hypertension.  The pt does  have diabetes. Tobacco hx:  ***  Pt does *** have family hx of AAA.  Past Medical History:  Diagnosis Date   Alcohol use    Ankle fracture, lateral malleolus, closed 2013   Anxiety    Aortic atherosclerosis ([place]) 03/10/2021   Breast mass, left 2013   CHF (congestive heart failure) ([place])    a. EF 20-25% by echo in 05/2016 with cath showing normal cors b. EF  50-55% in 07/2020 c. 01/2021: EF at 55-60% with moderate LVH   Chronic anemia    CKD (chronic kidney disease), stage III (HCC)    Cocaine abuse (HCC)    COPD (chronic obstructive pulmonary disease) ([place])    Diabetes mellitus, type 2 ([place])    Diabetic Charcot foot ([place])    Essential hypertension    History of cardiomyopathy    History of GI bleed 2011   Hyperlipidemia    Noncompliance    Obesity    PAF (paroxysmal atrial fibrillation) (HCC)    Panic attacks    PAT (paroxysmal atrial tachycardia) (HCC)    Previously on Amiodarone   Seizures ([place]) 10/01/2020   Sleep apnea    Not on CPAP   Stroke ([place]) 2018   Tobacco abuse    Urinary incontinence     Past Surgical History:  Procedure Laterality Date   ANGIOPLASTY ILLIAC ARTERY Right 04/23/2021   Procedure: ANGIOPLASTY AND STENT SUPERFICIAL FEMORAL ARTERY;  Surgeon: Cherre Robins, MD;  Location: [facility];  Service: Vascular;  Laterality: Right;   AORTOGRAM  04/23/2021   Procedure: AORTOGRAM;  Surgeon: Cherre Robins, MD;  Location: [facility];  Service: Vascular;;   AORTOGRAM Left 04/30/2021   Procedure: left lower extremity angiogram with second order cannulation;  Surgeon: Cherre Robins, MD;  Location: [facility];  Service: Vascular;  Laterality: Left;   BIOPSY  11/12/2020   Procedure: BIOPSY;  Surgeon: Harvel Quale, MD;  [facility];  Service: Gastroenterology;;   BREAST BIOPSY     CARDIAC CATHETERIZATION N/A 07/28/2016   Procedure: Left Heart Cath and Coronary Angiography;  Surgeon: Jettie Booze, MD;  Location: [facility];  Service: Cardiovascular;  Laterality: N/A;   COLONOSCOPY N/A 05/10/2019   Procedure: COLONOSCOPY;  Surgeon: Danie Binder, MD;  [facility];  Service: Endoscopy;  Laterality: N/A;  Phenergan 12.5 mg IV in pre-op   DILATION AND CURETTAGE OF UTERUS     ESOPHAGOGASTRODUODENOSCOPY (EGD) WITH PROPOFOL N/A 11/12/2020   Procedure: ESOPHAGOGASTRODUODENOSCOPY (EGD) WITH  PROPOFOL;  Surgeon: Harvel Quale, MD;  [facility];  Service: Gastroenterology;  Laterality: N/A;   I & D EXTREMITY Bilateral 09/22/2017   Procedure: BILATERAL DEBRIDEMENT LEG/FOOT ULCERS, APPLY VERAFLO WOUND VAC;  Surgeon: Newt Minion, MD;  Location: [facility];  Service: Orthopedics;  Laterality: Bilateral;   I & D EXTREMITY Right 10/11/2018   Procedure: IRRIGATION AND DEBRIDEMENT RIGHT HAND;  Surgeon: [name], MD;  Location: [facility];  Service: Orthopedics;  Laterality: Right;   I & D EXTREMITY Right 10/13/2018   Procedure: REPEAT IRRIGATION AND DEBRIDEMENT RIGHT HAND;  Surgeon: [name], MD;  Location: [facility];  Service: Orthopedics;  Laterality: Right;   I & D EXTREMITY Right 11/22/2018   Procedure: IRRIGATION AND DEBRIDEMENT AND PINNING RIGHT HAND;  Surgeon: [name], MD;  Location: [facility];  Service: Orthopedics;  Laterality: Right;   IR RADIOLOGIST EVAL & MGMT  07/05/2018   LOWER EXTREMITY ANGIOGRAM Bilateral 04/23/2021   Procedure: RIGHT  AND LEFT LOWER EXTREMITY ANGIOGRAM;  Surgeon: Cherre Robins, MD;  Location: [facility];  Service: Vascular;  Laterality: Bilateral;   POLYPECTOMY  05/10/2019   Procedure: POLYPECTOMY;  Surgeon: Danie Binder, MD;  [facility];  Service: Endoscopy;;   SKIN SPLIT GRAFT Bilateral 09/28/2017   Procedure: BILATERAL SPLIT THICKNESS SKIN GRAFT LEGS/FEET AND APPLY VAC;  Surgeon: Newt Minion, MD;  Location: [facility];  Service: Orthopedics;  Laterality: Bilateral;   SKIN SPLIT GRAFT Right 11/22/2018   Procedure: SKIN GRAFT SPLIT THICKNESS;  Surgeon: [name], MD;  Location: [facility];  Service: Orthopedics;  Laterality: Right;    Allergies  Allergen Reactions   Sodium Hypochlorite Anaphylaxis    (Bleach wipes) "I am not able to breathe"    Current Outpatient Medications  Medication Sig Dispense Refill   acetaminophen (TYLENOL) 325 MG tablet Take 2 tablets (650 mg total) by mouth every 6 (six) hours as needed  for headache, fever or mild pain. (Patient taking differently: Take 650 mg by mouth every 6 (six) hours as needed for fever (or pain).)     amLODipine (NORVASC) 5 MG tablet Take 5 mg by mouth daily.     ammonium lactate (AMLACTIN) 12 % lotion Apply 1 application. topically as needed for dry skin. 400 g 4   aspirin EC 81 MG EC tablet Take 1 tablet (81 mg total) by mouth daily. Swallow whole. 30 tablet 11   benztropine (COGENTIN) 0.5 MG tablet Take 1 tablet (0.5 mg total) by mouth 2 (two) times daily. 60 tablet 0   ELIQUIS 5 MG TABS tablet Take 5 mg by mouth 2 (two) times daily.     feeding supplement (ENSURE ENLIVE / ENSURE PLUS) LIQD Take 237 mLs by mouth 3 (three) times daily  between meals. 237 mL 12   FLUoxetine (PROZAC) 40 MG capsule Take 40 mg by mouth daily.     HUMALOG KWIKPEN 100 UNIT/ML KwikPen Inject 1-10 Units into the skin See admin instructions. Inject 1-10 units into the skin before meals and at bedtime, PER SLIDING SCALE: BGL 151-200 = 1 unit; 201-250 = 2 units; 251-300 = 4 units; 301-350 = 6 units; 351-400 = 8 units; 401-450 = 10 units     levETIRAcetam (KEPPRA) 500 MG tablet Take 1 tablet (500 mg total) by mouth 2 (two) times daily.     metoprolol succinate (TOPROL-XL) 25 MG 24 hr tablet Take 50 mg by mouth daily.     mirtazapine (REMERON) 7.5 MG tablet Take 7.5 mg by mouth at bedtime.     Multiple Vitamin (MULTIVITAMIN WITH MINERALS) TABS tablet Take 1 tablet by mouth daily.     omeprazole (PRILOSEC) 20 MG capsule Take 20 mg by mouth daily before breakfast.     oxybutynin (DITROPAN-XL) 5 MG 24 hr tablet Take 5 mg by mouth in the morning.     rosuvastatin (CRESTOR) 10 MG tablet Take 10 mg by mouth every evening.     sennosides-docusate sodium (SENOKOT-S) 8.6-50 MG tablet Take 1 tablet by mouth in the morning and at bedtime.     thiamine 100 MG tablet Take 1 tablet (100 mg total) by mouth daily.     tiotropium (SPIRIVA HANDIHALER) 18 MCG inhalation capsule Place 18 mcg into inhaler  and inhale daily as needed (Shortness of breath).     zinc sulfate 220 (50 Zn) MG capsule Take 1 capsule (220 mg total) by mouth daily. 30 capsule 2   No current facility-administered medications for this visit.    Family History  Problem Relation Age of Onset   Hypertension Mother    Heart attack Mother    Hypertension Father        CABG    Hypertension Sister    Hypertension Brother    Hypertension Sister    Cancer Sister        breast    Arthritis Other    Cancer Other    Diabetes Other    Asthma Other    Hypertension Daughter    Hypertension Son     Social History   Socioeconomic History   Marital status: Widowed    Spouse name: Not on file   Number of children: 2   Years of education: 9   Highest education level: 9th grade  Occupational History   Occupation: Disabled  Tobacco Use   Smoking status: Some Days    Packs/day: 0.25    Years: 20.00    Total pack years: 5.00    Types: Cigarettes    Last attempt to quit: 06/03/2017    Years since quitting: 5.0   Smokeless tobacco: Never  Vaping Use   Vaping Use: Never used  Substance and Sexual Activity   Alcohol use: Not Currently    Alcohol/week: 0.0 standard drinks of alcohol    Comment: Quit in 2017   Drug use: Not Currently    Frequency: 3.0 times per week    Types: Marijuana, Cocaine    Comment: none since August 2018   Sexual activity: Not on file  Other Topics Concern   Not on file  Social History Narrative   Right handed   Drinks 1-2 cups caffeine daily   Social Determinants of Health   Financial Resource Strain: Medium Risk (10/15/2020)   Overall Financial Resource  Strain (CARDIA)    Difficulty of Paying Living Expenses: Somewhat hard  Food Insecurity: Food Insecurity Present (10/15/2020)   Hunger Vital Sign    Worried About Running Out of Food in the Last Year: Sometimes true    Ran Out of Food in the Last Year: Sometimes true  Transportation Needs: No Transportation Needs (10/15/2020)    PRAPARE - Hydrologist (Medical): No    Lack of Transportation (Non-Medical): No  Physical Activity: Insufficiently Active (10/15/2020)   Exercise Vital Sign    Days of Exercise per Week: 7 days    Minutes of Exercise per Session: 20 min  Stress: Stress Concern Present (10/15/2020)   [employer]    Feeling of Stress : To some extent  Social Connections: Moderately Isolated (10/15/2020)   Social Connection and Isolation Panel [NHANES]    Frequency of Communication with Friends and Family: More than three times a week    Frequency of Social Gatherings with Friends and Family: More than three times a week    Attends Religious Services: More than 4 times per year    Active Member of [employer] or Organizations: No    Attends Archivist Meetings: Never    Marital Status: Widowed  Intimate Partner Violence: Not At Risk (10/15/2020)   Humiliation, Afraid, Rape, and Kick questionnaire    Fear of Current or Ex-Partner: No    Emotionally Abused: No    Physically Abused: No    Sexually Abused: No     REVIEW OF SYSTEMS:  *** '[X]'$  denotes positive finding, '[ ]'$  denotes negative finding Cardiac  Comments:  Chest pain or chest pressure:    Shortness of breath upon exertion:    Short of breath when lying flat:    Irregular heart rhythm:        Vascular    Pain in calf, thigh, or hip brought on by ambulation:    Pain in feet at night that wakes you up from your sleep:     Blood clot in your veins:    Leg swelling:         Pulmonary    Oxygen at home:    Productive cough:     Wheezing:         Neurologic    Sudden weakness in arms or legs:     Sudden numbness in arms or legs:     Sudden onset of difficulty speaking or slurred speech:    Temporary loss of vision in one eye:     Problems with dizziness:         Gastrointestinal    Blood in stool:     Vomited blood:          Genitourinary    Burning when urinating:     Blood in urine:        Psychiatric    Major depression:         Hematologic    Bleeding problems:    Problems with blood clotting too easily:        Skin    Rashes or ulcers:        Constitutional    Fever or chills:      PHYSICAL EXAMINATION:  ***  General:  WDWN in NAD; vital signs documented above Gait: Not observed HENT: WNL, normocephalic Pulmonary: normal non-labored breathing , without wheezing Cardiac: {Desc; regular/irreg:14544} HR, {With/Without:20273} carotid bruit*** Abdomen: soft, NT; aortic pulse  is *** palpable Skin: {With/Without:20273} rashes Vascular Exam/Pulses:  Right Left  Radial {Exam; arterial pulse strength 0-4:30167} {Exam; arterial pulse strength 0-4:30167}  Femoral {Exam; arterial pulse strength 0-4:30167} {Exam; arterial pulse strength 0-4:30167}  Popliteal {Exam; arterial pulse strength 0-4:30167} {Exam; arterial pulse strength 0-4:30167}  DP {Exam; arterial pulse strength 0-4:30167} {Exam; arterial pulse strength 0-4:30167}  PT {Exam; arterial pulse strength 0-4:30167} {Exam; arterial pulse strength 0-4:30167}  Peroneal *** ***   Extremities: {With/Without:20273} ischemic changes, {With/Without:20273} Gangrene , {With/Without:20273} cellulitis; {With/Without:20273} open wounds Musculoskeletal: no muscle wasting or atrophy  Neurologic: A&O X 3 Psychiatric:  The pt has {Desc; normal/abnormal:11317::"Normal"} affect.   Non-Invasive Vascular Imaging:   ABI's/TBI's on 06/30/2022: Right:  *** - Great toe pressure: *** Left:  *** - Great toe pressure: ***  Arterial duplex on 06/30/2022: ***  Previous ABI's/TBI's on 09/02/2021: Right:  1.05/unable to obtain - Great toe pressure: n/a Left:  1.21/0.63 - Great toe pressure:  71  Previous arterial duplex on 09/02/2021: Right: Patent right SFA stent with no visualized stenosis.  Left: Probable tibial artery occludive disease.     ASSESSMENT/PLAN::  56 y.o. female here for follow up for PAD with hx of angiogram with right SFA and popliteal artery stenting on 04/23/2021 by Dr. Stanford Breed for non healing wounds.  She subsequently underwent angiogram with attention to the LLE on 04/30/2022 by Dr. Stanford Breed with findings on inline flow to the foot    -*** -continue *** -pt will f/u in *** with ***.   Leontine Locket, [facility] [phone]  Clinic MD:   Stanford Breed

## 2022-06-30 ENCOUNTER — Ambulatory Visit (HOSPITAL_COMMUNITY): Payer: Medicare Other

## 2022-06-30 ENCOUNTER — Ambulatory Visit: Payer: Medicare Other

## 2022-07-03 DIAGNOSIS — K219 Gastro-esophageal reflux disease without esophagitis: Secondary | ICD-10-CM | POA: Diagnosis not present

## 2022-07-03 DIAGNOSIS — J449 Chronic obstructive pulmonary disease, unspecified: Secondary | ICD-10-CM | POA: Diagnosis not present

## 2022-07-03 DIAGNOSIS — E119 Type 2 diabetes mellitus without complications: Secondary | ICD-10-CM | POA: Diagnosis not present

## 2022-07-03 DIAGNOSIS — I4891 Unspecified atrial fibrillation: Secondary | ICD-10-CM | POA: Diagnosis not present

## 2022-07-03 DIAGNOSIS — E785 Hyperlipidemia, unspecified: Secondary | ICD-10-CM | POA: Diagnosis not present

## 2022-07-03 DIAGNOSIS — I10 Essential (primary) hypertension: Secondary | ICD-10-CM | POA: Diagnosis not present

## 2022-07-03 DIAGNOSIS — N3281 Overactive bladder: Secondary | ICD-10-CM | POA: Diagnosis not present

## 2022-07-03 DIAGNOSIS — I5030 Unspecified diastolic (congestive) heart failure: Secondary | ICD-10-CM | POA: Diagnosis not present

## 2022-07-03 DIAGNOSIS — I739 Peripheral vascular disease, unspecified: Secondary | ICD-10-CM | POA: Diagnosis not present

## 2022-07-09 DIAGNOSIS — K219 Gastro-esophageal reflux disease without esophagitis: Secondary | ICD-10-CM | POA: Diagnosis not present

## 2022-07-09 DIAGNOSIS — I739 Peripheral vascular disease, unspecified: Secondary | ICD-10-CM | POA: Diagnosis not present

## 2022-07-09 DIAGNOSIS — E785 Hyperlipidemia, unspecified: Secondary | ICD-10-CM | POA: Diagnosis not present

## 2022-07-09 DIAGNOSIS — J449 Chronic obstructive pulmonary disease, unspecified: Secondary | ICD-10-CM | POA: Diagnosis not present

## 2022-07-09 DIAGNOSIS — I10 Essential (primary) hypertension: Secondary | ICD-10-CM | POA: Diagnosis not present

## 2022-07-09 DIAGNOSIS — N3281 Overactive bladder: Secondary | ICD-10-CM | POA: Diagnosis not present

## 2022-07-09 DIAGNOSIS — I4891 Unspecified atrial fibrillation: Secondary | ICD-10-CM | POA: Diagnosis not present

## 2022-07-09 DIAGNOSIS — I5030 Unspecified diastolic (congestive) heart failure: Secondary | ICD-10-CM | POA: Diagnosis not present

## 2022-07-14 DIAGNOSIS — I10 Essential (primary) hypertension: Secondary | ICD-10-CM | POA: Diagnosis not present

## 2022-07-14 DIAGNOSIS — I5030 Unspecified diastolic (congestive) heart failure: Secondary | ICD-10-CM | POA: Diagnosis not present

## 2022-07-14 DIAGNOSIS — I4891 Unspecified atrial fibrillation: Secondary | ICD-10-CM | POA: Diagnosis not present

## 2022-07-14 DIAGNOSIS — D638 Anemia in other chronic diseases classified elsewhere: Secondary | ICD-10-CM | POA: Diagnosis not present

## 2022-07-14 DIAGNOSIS — J449 Chronic obstructive pulmonary disease, unspecified: Secondary | ICD-10-CM | POA: Diagnosis not present

## 2022-07-14 DIAGNOSIS — E1151 Type 2 diabetes mellitus with diabetic peripheral angiopathy without gangrene: Secondary | ICD-10-CM | POA: Diagnosis not present

## 2022-07-14 DIAGNOSIS — E785 Hyperlipidemia, unspecified: Secondary | ICD-10-CM | POA: Diagnosis not present

## 2022-07-21 ENCOUNTER — Ambulatory Visit (HOSPITAL_COMMUNITY)
Admission: RE | Admit: 2022-07-21 | Discharge: 2022-07-21 | Disposition: A | Discharge status: Home / Self Care | Payer: Medicare Other | Source: Ambulatory Visit | Attending: Physician Assistant | Admitting: Physician Assistant

## 2022-07-21 ENCOUNTER — Ambulatory Visit (INDEPENDENT_AMBULATORY_CARE_PROVIDER_SITE_OTHER): Payer: Medicare Other | Admitting: Physician Assistant

## 2022-07-21 ENCOUNTER — Ambulatory Visit (INDEPENDENT_AMBULATORY_CARE_PROVIDER_SITE_OTHER)
Admission: RE | Admit: 2022-07-21 | Discharge: 2022-07-21 | Disposition: A | Discharge status: Home / Self Care | Payer: Medicare Other | Source: Ambulatory Visit | Attending: Vascular Surgery | Admitting: Vascular Surgery

## 2022-07-21 VITALS — BP 176/114 | HR 93 | Temp 97.4°F | Resp 20 | Ht 61.0 in | Wt 141.1 lb

## 2022-07-21 DIAGNOSIS — I739 Peripheral vascular disease, unspecified: Secondary | ICD-10-CM | POA: Insufficient documentation

## 2022-07-21 NOTE — Progress Notes (Signed)
Office Note     CC:  follow up Requesting Provider:  [name], MD  HPI: [name] is a 56 y.o. ([birth year]) female who presents for surveillance of PAD.  She is accompanied by her sister today.  She still resides at [facility] facility.  She underwent right SFA stenting by Dr. Stanford Breed in June 2022.  She overcame chronic wounds of bilateral ankles with gangrenous skin changes.  Patient states she is ambulatory with therapy however is seen today in a wheelchair.  She denies rest pain or further tissue changes of bilateral lower extremities.  She is on Eliquis and statin daily.  She denies tobacco use.   Past Medical History:  Diagnosis Date   Alcohol use    Ankle fracture, lateral malleolus, closed 2013   Anxiety    Aortic atherosclerosis ([place]) 03/10/2021   Breast mass, left 2013   CHF (congestive heart failure) ([place])    a. EF 20-25% by echo in 05/2016 with cath showing normal cors b. EF 50-55% in 07/2020 c. 01/2021: EF at 55-60% with moderate LVH   Chronic anemia    CKD (chronic kidney disease), stage III (HCC)    Cocaine abuse (HCC)    COPD (chronic obstructive pulmonary disease) ([place])    Diabetes mellitus, type 2 ([place])    Diabetic Charcot foot ([place])    Essential hypertension    History of cardiomyopathy    History of GI bleed 2011   Hyperlipidemia    Noncompliance    Obesity    PAF (paroxysmal atrial fibrillation) (HCC)    Panic attacks    PAT (paroxysmal atrial tachycardia) (HCC)    Previously on Amiodarone   Seizures ([place]) 10/01/2020   Sleep apnea    Not on CPAP   Stroke ([place]) 2018   Tobacco abuse    Urinary incontinence     Past Surgical History:  Procedure Laterality Date   ANGIOPLASTY ILLIAC ARTERY Right 04/23/2021   Procedure: ANGIOPLASTY AND STENT SUPERFICIAL FEMORAL ARTERY;  Surgeon: Cherre Robins, MD;  Location: [facility];  Service: Vascular;  Laterality: Right;   AORTOGRAM  04/23/2021   Procedure: AORTOGRAM;  Surgeon: Cherre Robins, MD;  Location: [facility];  Service: Vascular;;   AORTOGRAM Left 04/30/2021   Procedure: left lower extremity angiogram with second order cannulation;  Surgeon: Cherre Robins, MD;  Location: [facility];  Service: Vascular;  Laterality: Left;   BIOPSY  11/12/2020   Procedure: BIOPSY;  Surgeon: Harvel Quale, MD;  [facility];  Service: Gastroenterology;;   BREAST BIOPSY     CARDIAC CATHETERIZATION N/A 07/28/2016   Procedure: Left Heart Cath and Coronary Angiography;  Surgeon: Jettie Booze, MD;  Location: [facility];  Service: Cardiovascular;  Laterality: N/A;   COLONOSCOPY N/A 05/10/2019   Procedure: COLONOSCOPY;  Surgeon: Danie Binder, MD;  [facility];  Service: Endoscopy;  Laterality: N/A;  Phenergan 12.5 mg IV in pre-op   DILATION AND CURETTAGE OF UTERUS     ESOPHAGOGASTRODUODENOSCOPY (EGD) WITH PROPOFOL N/A 11/12/2020   Procedure: ESOPHAGOGASTRODUODENOSCOPY (EGD) WITH PROPOFOL;  Surgeon: Harvel Quale, MD;  [facility];  Service: Gastroenterology;  Laterality: N/A;   I & D EXTREMITY Bilateral 09/22/2017   Procedure: BILATERAL DEBRIDEMENT LEG/FOOT ULCERS, APPLY VERAFLO WOUND VAC;  Surgeon: Newt Minion, MD;  Location: [facility];  Service: Orthopedics;  Laterality: Bilateral;   I & D EXTREMITY Right 10/11/2018   Procedure: IRRIGATION AND  DEBRIDEMENT RIGHT HAND;  Surgeon: [name], MD;  Location: [facility];  Service: Orthopedics;  Laterality: Right;   I & D EXTREMITY Right 10/13/2018   Procedure: REPEAT IRRIGATION AND DEBRIDEMENT RIGHT HAND;  Surgeon: [name], MD;  Location: [facility];  Service: Orthopedics;  Laterality: Right;   I & D EXTREMITY Right 11/22/2018   Procedure: IRRIGATION AND DEBRIDEMENT AND PINNING RIGHT HAND;  Surgeon: [name], MD;  Location: [facility];  Service: Orthopedics;  Laterality: Right;   IR RADIOLOGIST EVAL & MGMT  07/05/2018   LOWER EXTREMITY ANGIOGRAM Bilateral 04/23/2021   Procedure: RIGHT   AND LEFT LOWER EXTREMITY ANGIOGRAM;  Surgeon: Cherre Robins, MD;  Location: [facility];  Service: Vascular;  Laterality: Bilateral;   POLYPECTOMY  05/10/2019   Procedure: POLYPECTOMY;  Surgeon: Danie Binder, MD;  [facility];  Service: Endoscopy;;   SKIN SPLIT GRAFT Bilateral 09/28/2017   Procedure: BILATERAL SPLIT THICKNESS SKIN GRAFT LEGS/FEET AND APPLY VAC;  Surgeon: Newt Minion, MD;  Location: [facility];  Service: Orthopedics;  Laterality: Bilateral;   SKIN SPLIT GRAFT Right 11/22/2018   Procedure: SKIN GRAFT SPLIT THICKNESS;  Surgeon: [name], MD;  Location: [facility];  Service: Orthopedics;  Laterality: Right;    Social History   Socioeconomic History   Marital status: Widowed    Spouse name: Not on file   Number of children: 2   Years of education: 9   Highest education level: 9th grade  Occupational History   Occupation: Disabled  Tobacco Use   Smoking status: Former    Packs/day: 0.25    Years: 20.00    Total pack years: 5.00    Types: Cigarettes    Quit date: 06/03/2017    Years since quitting: 5.1    Passive exposure: Never   Smokeless tobacco: Never  Vaping Use   Vaping Use: Never used  Substance and Sexual Activity   Alcohol use: Not Currently    Alcohol/week: 0.0 standard drinks of alcohol    Comment: Quit in 2017   Drug use: Not Currently    Frequency: 3.0 times per week    Types: Marijuana, Cocaine    Comment: none since August 2018   Sexual activity: Not on file  Other Topics Concern   Not on file  Social History Narrative   Right handed   Drinks 1-2 cups caffeine daily   Social Determinants of Health   Financial Resource Strain: Medium Risk (10/15/2020)   Overall Financial Resource Strain (CARDIA)    Difficulty of Paying Living Expenses: Somewhat hard  Food Insecurity: Food Insecurity Present (10/15/2020)   Hunger Vital Sign    Worried About [place] in the Last Year: Sometimes true    Ran Out of Food in the Last Year:  Sometimes true  Transportation Needs: No Transportation Needs (10/15/2020)   PRAPARE - Hydrologist (Medical): No    Lack of Transportation (Non-Medical): No  Physical Activity: Insufficiently Active (10/15/2020)   Exercise Vital Sign    Days of Exercise per Week: 7 days    Minutes of Exercise per Session: 20 min  Stress: Stress Concern Present (10/15/2020)   [place]    Feeling of Stress : To some extent  Social Connections: Moderately Isolated (10/15/2020)   Social Connection and Isolation Panel [NHANES]    Frequency of Communication with Friends and Family: More than three times a week  Frequency of Social Gatherings with Friends and Family: More than three times a week    Attends Religious Services: More than 4 times per year    Active Member of Clubs or Organizations: No    Attends Archivist Meetings: Never    Marital Status: Widowed  Intimate Partner Violence: Not At Risk (10/15/2020)   Humiliation, Afraid, Rape, and Kick questionnaire    Fear of Current or Ex-Partner: No    Emotionally Abused: No    Physically Abused: No    Sexually Abused: No    Family History  Problem Relation Age of Onset   Hypertension Mother    Heart attack Mother    Hypertension Father        CABG    Hypertension Sister    Hypertension Brother    Hypertension Sister    Cancer Sister        breast    Arthritis Other    Cancer Other    Diabetes Other    Asthma Other    Hypertension Daughter    Hypertension Son     Current Outpatient Medications  Medication Sig Dispense Refill   acetaminophen (TYLENOL) 325 MG tablet Take 2 tablets (650 mg total) by mouth every 6 (six) hours as needed for headache, fever or mild pain. (Patient taking differently: Take 650 mg by mouth every 6 (six) hours as needed for fever (or pain).)     amLODipine (NORVASC) 5 MG tablet Take 5 mg by mouth daily.      ammonium lactate (AMLACTIN) 12 % lotion Apply 1 application. topically as needed for dry skin. 400 g 4   aspirin EC 81 MG EC tablet Take 1 tablet (81 mg total) by mouth daily. Swallow whole. 30 tablet 11   benztropine (COGENTIN) 0.5 MG tablet Take 1 tablet (0.5 mg total) by mouth 2 (two) times daily. 60 tablet 0   ELIQUIS 5 MG TABS tablet Take 5 mg by mouth 2 (two) times daily.     feeding supplement (ENSURE ENLIVE / ENSURE PLUS) LIQD Take 237 mLs by mouth 3 (three) times daily between meals. 237 mL 12   FLUoxetine (PROZAC) 40 MG capsule Take 40 mg by mouth daily.     HUMALOG KWIKPEN 100 UNIT/ML KwikPen Inject 1-10 Units into the skin See admin instructions. Inject 1-10 units into the skin before meals and at bedtime, PER SLIDING SCALE: BGL 151-200 = 1 unit; 201-250 = 2 units; 251-300 = 4 units; 301-350 = 6 units; 351-400 = 8 units; 401-450 = 10 units     levETIRAcetam (KEPPRA) 500 MG tablet Take 1 tablet (500 mg total) by mouth 2 (two) times daily.     metoprolol succinate (TOPROL-XL) 25 MG 24 hr tablet Take 50 mg by mouth daily.     mirtazapine (REMERON) 7.5 MG tablet Take 7.5 mg by mouth at bedtime.     Multiple Vitamin (MULTIVITAMIN WITH MINERALS) TABS tablet Take 1 tablet by mouth daily.     omeprazole (PRILOSEC) 20 MG capsule Take 20 mg by mouth daily before breakfast.     oxybutynin (DITROPAN-XL) 5 MG 24 hr tablet Take 5 mg by mouth in the morning.     rosuvastatin (CRESTOR) 10 MG tablet Take 10 mg by mouth every evening.     sennosides-docusate sodium (SENOKOT-S) 8.6-50 MG tablet Take 1 tablet by mouth in the morning and at bedtime.     thiamine 100 MG tablet Take 1 tablet (100 mg total) by mouth daily.  tiotropium (SPIRIVA HANDIHALER) 18 MCG inhalation capsule Place 18 mcg into inhaler and inhale daily as needed (Shortness of breath).     zinc sulfate 220 (50 Zn) MG capsule Take 1 capsule (220 mg total) by mouth daily. 30 capsule 2   No current facility-administered medications for  this visit.    Allergies  Allergen Reactions   Sodium Hypochlorite Anaphylaxis    (Bleach wipes) "I am not able to breathe"     REVIEW OF SYSTEMS:   '[X]'$  denotes positive finding, '[ ]'$  denotes negative finding Cardiac  Comments:  Chest pain or chest pressure:    Shortness of breath upon exertion:    Short of breath when lying flat:    Irregular heart rhythm:        Vascular    Pain in calf, thigh, or hip brought on by ambulation:    Pain in feet at night that wakes you up from your sleep:     Blood clot in your veins:    Leg swelling:         Pulmonary    Oxygen at home:    Productive cough:     Wheezing:         Neurologic    Sudden weakness in arms or legs:     Sudden numbness in arms or legs:     Sudden onset of difficulty speaking or slurred speech:    Temporary loss of vision in one eye:     Problems with dizziness:         Gastrointestinal    Blood in stool:     Vomited blood:         Genitourinary    Burning when urinating:     Blood in urine:        Psychiatric    Major depression:         Hematologic    Bleeding problems:    Problems with blood clotting too easily:        Skin    Rashes or ulcers:        Constitutional    Fever or chills:      PHYSICAL EXAMINATION:  Vitals:   07/21/22 1418  BP: (!) 176/114  Pulse: 93  Resp: 20  Temp: (!) 97.4 F (36.3 C)  TempSrc: Temporal  SpO2: 99%  Weight: 141 lb 1.6 oz (64 kg)  Height: '5\' 1"'$  (1.549 m)    General:  WDWN in NAD; vital signs documented above Gait: Not observed HENT: WNL, normocephalic Pulmonary: normal non-labored breathing , without Rales, rhonchi,  wheezing Cardiac: regular HR Abdomen: soft, NT, no masses Skin: without rashes Vascular Exam/Pulses:  Right Left  DP 2+ (normal) 1+ (weak)   Extremities: without ischemic changes, without Gangrene , without cellulitis; without open wounds;  Musculoskeletal: no muscle wasting or atrophy  Neurologic: A&O X 3;  No focal weakness  or paresthesias are detected Psychiatric:  The pt has Normal affect.   Non-Invasive Vascular Imaging:   Right SFA stent widely patent Distal right SFA with 254 cm/s velocity  ABI/TBIToday's ABIToday's TBIPrevious ABIPrevious TBI  +-------+-----------+-----------+------------+------------+  Right  1.05       0.89       1.05        -             +-------+-----------+-----------+------------+------------+  Left   1.02       0.71       1.21        0.63  ASSESSMENT/PLAN:: 56 y.o. female here for follow up for surveillance of PAD and right SFA stent   -Bilateral lower extremity chronic wounds of ankles and feet are completely healed -Patient has palpable DP pulses bilaterally right is stronger than the left -Arterial duplex demonstrates a widely patent right SFA stent.  She does have mildly elevated velocities distal to the stent in the SFA however waveform does not change beyond this area and ABIs are preserved -Encourage patient to continue to work with therapy to develop muscle mass in the legs -Recheck RLE arterial duplex and ABIs in 1 year   Dagoberto Ligas, PA-C [facility] [phone]  Clinic MD:   Stanford Breed

## 2022-07-29 DIAGNOSIS — I10 Essential (primary) hypertension: Secondary | ICD-10-CM | POA: Diagnosis not present

## 2022-07-29 DIAGNOSIS — E119 Type 2 diabetes mellitus without complications: Secondary | ICD-10-CM | POA: Diagnosis not present

## 2022-08-05 DIAGNOSIS — H2512 Age-related nuclear cataract, left eye: Secondary | ICD-10-CM | POA: Diagnosis not present

## 2022-08-05 DIAGNOSIS — H269 Unspecified cataract: Secondary | ICD-10-CM | POA: Diagnosis not present

## 2022-08-12 DIAGNOSIS — I10 Essential (primary) hypertension: Secondary | ICD-10-CM | POA: Diagnosis not present

## 2022-08-12 DIAGNOSIS — J449 Chronic obstructive pulmonary disease, unspecified: Secondary | ICD-10-CM | POA: Diagnosis not present

## 2022-08-12 DIAGNOSIS — E1151 Type 2 diabetes mellitus with diabetic peripheral angiopathy without gangrene: Secondary | ICD-10-CM | POA: Diagnosis not present

## 2022-08-12 DIAGNOSIS — I5030 Unspecified diastolic (congestive) heart failure: Secondary | ICD-10-CM | POA: Diagnosis not present

## 2022-08-12 DIAGNOSIS — K219 Gastro-esophageal reflux disease without esophagitis: Secondary | ICD-10-CM | POA: Diagnosis not present

## 2022-08-12 DIAGNOSIS — I4891 Unspecified atrial fibrillation: Secondary | ICD-10-CM | POA: Diagnosis not present

## 2022-08-12 DIAGNOSIS — D638 Anemia in other chronic diseases classified elsewhere: Secondary | ICD-10-CM | POA: Diagnosis not present

## 2022-08-12 DIAGNOSIS — I739 Peripheral vascular disease, unspecified: Secondary | ICD-10-CM | POA: Diagnosis not present

## 2022-08-12 DIAGNOSIS — E785 Hyperlipidemia, unspecified: Secondary | ICD-10-CM | POA: Diagnosis not present

## 2022-08-12 DIAGNOSIS — N3281 Overactive bladder: Secondary | ICD-10-CM | POA: Diagnosis not present

## 2022-08-20 DIAGNOSIS — I4891 Unspecified atrial fibrillation: Secondary | ICD-10-CM | POA: Diagnosis not present

## 2022-08-20 DIAGNOSIS — K219 Gastro-esophageal reflux disease without esophagitis: Secondary | ICD-10-CM | POA: Diagnosis not present

## 2022-08-20 DIAGNOSIS — I739 Peripheral vascular disease, unspecified: Secondary | ICD-10-CM | POA: Diagnosis not present

## 2022-08-20 DIAGNOSIS — I5030 Unspecified diastolic (congestive) heart failure: Secondary | ICD-10-CM | POA: Diagnosis not present

## 2022-08-20 DIAGNOSIS — R5381 Other malaise: Secondary | ICD-10-CM | POA: Diagnosis not present

## 2022-08-20 DIAGNOSIS — E785 Hyperlipidemia, unspecified: Secondary | ICD-10-CM | POA: Diagnosis not present

## 2022-08-20 DIAGNOSIS — N3281 Overactive bladder: Secondary | ICD-10-CM | POA: Diagnosis not present

## 2022-08-20 DIAGNOSIS — J449 Chronic obstructive pulmonary disease, unspecified: Secondary | ICD-10-CM | POA: Diagnosis not present

## 2022-08-27 DIAGNOSIS — I10 Essential (primary) hypertension: Secondary | ICD-10-CM | POA: Diagnosis not present

## 2022-08-27 DIAGNOSIS — E119 Type 2 diabetes mellitus without complications: Secondary | ICD-10-CM | POA: Diagnosis not present

## 2022-09-05 DIAGNOSIS — I10 Essential (primary) hypertension: Secondary | ICD-10-CM | POA: Diagnosis not present

## 2022-09-05 DIAGNOSIS — I5030 Unspecified diastolic (congestive) heart failure: Secondary | ICD-10-CM | POA: Diagnosis not present

## 2022-09-05 DIAGNOSIS — E1151 Type 2 diabetes mellitus with diabetic peripheral angiopathy without gangrene: Secondary | ICD-10-CM | POA: Diagnosis not present

## 2022-09-05 DIAGNOSIS — E785 Hyperlipidemia, unspecified: Secondary | ICD-10-CM | POA: Diagnosis not present

## 2022-09-05 DIAGNOSIS — J449 Chronic obstructive pulmonary disease, unspecified: Secondary | ICD-10-CM | POA: Diagnosis not present

## 2022-09-05 DIAGNOSIS — I4891 Unspecified atrial fibrillation: Secondary | ICD-10-CM | POA: Diagnosis not present

## 2022-09-05 DIAGNOSIS — D638 Anemia in other chronic diseases classified elsewhere: Secondary | ICD-10-CM | POA: Diagnosis not present

## 2022-09-07 ENCOUNTER — Emergency Department (HOSPITAL_COMMUNITY)
Admission: EM | Admit: 2022-09-07 | Discharge: 2022-09-08 | Disposition: A | Discharge status: Skilled Nursing Facility | Payer: Medicare Other | Attending: Emergency Medicine | Admitting: Emergency Medicine

## 2022-09-07 ENCOUNTER — Emergency Department (HOSPITAL_COMMUNITY): Payer: Medicare Other

## 2022-09-07 DIAGNOSIS — I480 Paroxysmal atrial fibrillation: Secondary | ICD-10-CM | POA: Diagnosis not present

## 2022-09-07 DIAGNOSIS — M47812 Spondylosis without myelopathy or radiculopathy, cervical region: Secondary | ICD-10-CM | POA: Diagnosis not present

## 2022-09-07 DIAGNOSIS — M542 Cervicalgia: Secondary | ICD-10-CM | POA: Insufficient documentation

## 2022-09-07 DIAGNOSIS — N183 Chronic kidney disease, stage 3 unspecified: Secondary | ICD-10-CM | POA: Insufficient documentation

## 2022-09-07 DIAGNOSIS — Z720 Tobacco use: Secondary | ICD-10-CM | POA: Insufficient documentation

## 2022-09-07 DIAGNOSIS — Z79899 Other long term (current) drug therapy: Secondary | ICD-10-CM | POA: Insufficient documentation

## 2022-09-07 DIAGNOSIS — S199XXA Unspecified injury of neck, initial encounter: Secondary | ICD-10-CM | POA: Diagnosis not present

## 2022-09-07 DIAGNOSIS — Z7901 Long term (current) use of anticoagulants: Secondary | ICD-10-CM | POA: Insufficient documentation

## 2022-09-07 DIAGNOSIS — K8020 Calculus of gallbladder without cholecystitis without obstruction: Secondary | ICD-10-CM | POA: Diagnosis not present

## 2022-09-07 DIAGNOSIS — S0990XA Unspecified injury of head, initial encounter: Secondary | ICD-10-CM | POA: Diagnosis not present

## 2022-09-07 DIAGNOSIS — Z7982 Long term (current) use of aspirin: Secondary | ICD-10-CM | POA: Insufficient documentation

## 2022-09-07 DIAGNOSIS — T699XXA Effect of reduced temperature, unspecified, initial encounter: Secondary | ICD-10-CM | POA: Diagnosis not present

## 2022-09-07 DIAGNOSIS — I130 Hypertensive heart and chronic kidney disease with heart failure and stage 1 through stage 4 chronic kidney disease, or unspecified chronic kidney disease: Secondary | ICD-10-CM | POA: Diagnosis not present

## 2022-09-07 DIAGNOSIS — Y92410 Unspecified street and highway as the place of occurrence of the external cause: Secondary | ICD-10-CM | POA: Diagnosis not present

## 2022-09-07 DIAGNOSIS — R000 Tachycardia, unspecified: Secondary | ICD-10-CM | POA: Diagnosis not present

## 2022-09-07 DIAGNOSIS — M25562 Pain in left knee: Secondary | ICD-10-CM | POA: Insufficient documentation

## 2022-09-07 DIAGNOSIS — I509 Heart failure, unspecified: Secondary | ICD-10-CM | POA: Insufficient documentation

## 2022-09-07 DIAGNOSIS — Z743 Need for continuous supervision: Secondary | ICD-10-CM | POA: Diagnosis not present

## 2022-09-07 DIAGNOSIS — S80919A Unspecified superficial injury of unspecified knee, initial encounter: Secondary | ICD-10-CM | POA: Diagnosis not present

## 2022-09-07 DIAGNOSIS — M545 Low back pain, unspecified: Secondary | ICD-10-CM | POA: Insufficient documentation

## 2022-09-07 DIAGNOSIS — M5136 Other intervertebral disc degeneration, lumbar region: Secondary | ICD-10-CM | POA: Diagnosis not present

## 2022-09-07 DIAGNOSIS — J439 Emphysema, unspecified: Secondary | ICD-10-CM | POA: Diagnosis not present

## 2022-09-07 DIAGNOSIS — R6889 Other general symptoms and signs: Secondary | ICD-10-CM | POA: Diagnosis not present

## 2022-09-07 DIAGNOSIS — M40204 Unspecified kyphosis, thoracic region: Secondary | ICD-10-CM | POA: Diagnosis not present

## 2022-09-07 DIAGNOSIS — M549 Dorsalgia, unspecified: Secondary | ICD-10-CM | POA: Diagnosis not present

## 2022-09-07 DIAGNOSIS — D689 Coagulation defect, unspecified: Secondary | ICD-10-CM | POA: Diagnosis not present

## 2022-09-07 DIAGNOSIS — M25561 Pain in right knee: Secondary | ICD-10-CM | POA: Diagnosis not present

## 2022-09-07 DIAGNOSIS — I700 Atherosclerosis of aorta: Secondary | ICD-10-CM | POA: Diagnosis not present

## 2022-09-07 LAB — CBC WITH DIFFERENTIAL/PLATELET
Abs Immature Granulocytes: 0.03 10*3/uL (ref 0.00–0.07)
Basophils Absolute: 0.1 10*3/uL (ref 0.0–0.1)
Basophils Relative: 1 %
Eosinophils Absolute: 0.2 10*3/uL (ref 0.0–0.5)
Eosinophils Relative: 2 %
HCT: 39.8 % (ref 36.0–46.0)
Hemoglobin: 13.3 g/dL (ref 12.0–15.0)
Immature Granulocytes: 0 %
Lymphocytes Relative: 21 %
Lymphs Abs: 1.8 10*3/uL (ref 0.7–4.0)
MCH: 31.4 pg (ref 26.0–34.0)
MCHC: 33.4 g/dL (ref 30.0–36.0)
MCV: 93.9 fL (ref 80.0–100.0)
Monocytes Absolute: 0.5 10*3/uL (ref 0.1–1.0)
Monocytes Relative: 6 %
Neutro Abs: 5.8 10*3/uL (ref 1.7–7.7)
Neutrophils Relative %: 70 %
Platelets: 285 10*3/uL (ref 150–400)
RBC: 4.24 MIL/uL (ref 3.87–5.11)
RDW: 13 % (ref 11.5–15.5)
WBC: 8.2 10*3/uL (ref 4.0–10.5)
nRBC: 0 % (ref 0.0–0.2)

## 2022-09-07 LAB — COMPREHENSIVE METABOLIC PANEL
ALT: 27 U/L (ref 0–44)
AST: 30 U/L (ref 15–41)
Albumin: 2.8 g/dL — ABNORMAL LOW (ref 3.5–5.0)
Alkaline Phosphatase: 110 U/L (ref 38–126)
Anion gap: 13 (ref 5–15)
BUN: 43 mg/dL — ABNORMAL HIGH (ref 6–20)
CO2: 19 mmol/L — ABNORMAL LOW (ref 22–32)
Calcium: 9.3 mg/dL (ref 8.9–10.3)
Chloride: 109 mmol/L (ref 98–111)
Creatinine, Ser: 2.34 mg/dL — ABNORMAL HIGH (ref 0.44–1.00)
GFR, Estimated: 24 mL/min — ABNORMAL LOW (ref >60)
Glucose, Bld: 154 mg/dL — ABNORMAL HIGH (ref 70–99)
Potassium: 4.3 mmol/L (ref 3.5–5.1)
Sodium: 141 mmol/L (ref 135–145)
Total Bilirubin: 0.5 mg/dL (ref 0.3–1.2)
Total Protein: 6.9 g/dL (ref 6.5–8.1)

## 2022-09-07 MED ORDER — LACTATED RINGERS IV BOLUS
500.0000 mL | Freq: Once | INTRAVENOUS | Status: AC
  Administered 2022-09-07: 500 mL via INTRAVENOUS

## 2022-09-07 MED ORDER — METOPROLOL SUCCINATE ER 25 MG PO TB24
50.0000 mg | ORAL_TABLET | Freq: Once | ORAL | Status: AC
  Administered 2022-09-07: 50 mg via ORAL
  Filled 2022-09-07: qty 2

## 2022-09-07 MED ORDER — ACETAMINOPHEN 325 MG PO TABS
650.0000 mg | ORAL_TABLET | Freq: Once | ORAL | Status: AC
  Administered 2022-09-07: 650 mg via ORAL
  Filled 2022-09-07: qty 2

## 2022-09-07 MED ORDER — METHOCARBAMOL 500 MG PO TABS
500.0000 mg | ORAL_TABLET | Freq: Once | ORAL | Status: AC
  Administered 2022-09-07: 500 mg via ORAL
  Filled 2022-09-07: qty 1

## 2022-09-07 MED ORDER — LIDOCAINE 5 % EX PTCH
1.0000 | MEDICATED_PATCH | CUTANEOUS | Status: DC
  Administered 2022-09-07: 1 via TRANSDERMAL
  Filled 2022-09-07: qty 1

## 2022-09-07 NOTE — ED Triage Notes (Signed)
Pt to ED via EMS. Pt was front passenger in [place]. Pt c/o neck and back pain. Pt also c/o right knee pain. Pt denies LOC. Pt denies hitting head. Pt on eliquis. Pt in c-collar upon arrival to ED. Pt c/o bilateral tingling in legs.   EMS Vitals: 146 palp 115 HR 93% RA 213 CBG
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
## 2022-09-07 NOTE — ED Notes (Signed)
Sister [name] [phone] would like an update asap

## 2022-09-07 NOTE — ED Notes (Signed)
Pt from [place].

## 2022-09-08 ENCOUNTER — Emergency Department (HOSPITAL_COMMUNITY): Payer: Medicare Other

## 2022-09-08 DIAGNOSIS — M549 Dorsalgia, unspecified: Secondary | ICD-10-CM | POA: Diagnosis not present

## 2022-09-08 DIAGNOSIS — Z743 Need for continuous supervision: Secondary | ICD-10-CM | POA: Diagnosis not present

## 2022-09-08 DIAGNOSIS — R531 Weakness: Secondary | ICD-10-CM | POA: Diagnosis not present

## 2022-09-08 DIAGNOSIS — S0990XA Unspecified injury of head, initial encounter: Secondary | ICD-10-CM | POA: Diagnosis not present

## 2022-09-08 DIAGNOSIS — Z7401 Bed confinement status: Secondary | ICD-10-CM | POA: Diagnosis not present

## 2022-09-08 DIAGNOSIS — M5136 Other intervertebral disc degeneration, lumbar region: Secondary | ICD-10-CM | POA: Diagnosis not present

## 2022-09-08 DIAGNOSIS — K8020 Calculus of gallbladder without cholecystitis without obstruction: Secondary | ICD-10-CM | POA: Diagnosis not present

## 2022-09-08 DIAGNOSIS — M545 Low back pain, unspecified: Secondary | ICD-10-CM | POA: Diagnosis not present

## 2022-09-08 DIAGNOSIS — M40204 Unspecified kyphosis, thoracic region: Secondary | ICD-10-CM | POA: Diagnosis not present

## 2022-09-08 DIAGNOSIS — J439 Emphysema, unspecified: Secondary | ICD-10-CM | POA: Diagnosis not present

## 2022-09-08 MED ORDER — CETIRIZINE HCL 10 MG PO TABS: 10.0000 mg | ORAL_TABLET | Freq: Every day | ORAL | 0 refills | Status: DC

## 2022-09-08 NOTE — ED Notes (Signed)
Pt coming from [place].
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
## 2022-09-08 NOTE — ED Notes (Signed)
Attempted to call pt's sister to update sister that pt is being discharged. sister did not answer phone.

## 2022-09-08 NOTE — ED Notes (Signed)
[facility] and updated them that pt will be returning tonight via [facility].

## 2022-09-08 NOTE — Discharge Instructions (Signed)
Please follow-up with your doctor.  Return for new or worsening symptoms.

## 2022-09-14 ENCOUNTER — Telehealth: Payer: Self-pay

## 2022-09-14 NOTE — Telephone Encounter (Signed)
     Patient  visit on 11/14  at [facility]   Have you been able to follow up with your primary care physician? Yes   The patient was or was not able to obtain any needed medicine or equipment. Yes   Are there diet recommendations that you are having difficulty following? Yes    Patient expresses understanding of discharge instructions and education provided has no other needs at this time.  Yes      [place], [facility], Care Management  [phone] [address], [place], [place] [address] Phone: [phone] Email: [email or web address]

## 2022-09-21 DIAGNOSIS — I5030 Unspecified diastolic (congestive) heart failure: Secondary | ICD-10-CM | POA: Diagnosis not present

## 2022-09-21 DIAGNOSIS — K219 Gastro-esophageal reflux disease without esophagitis: Secondary | ICD-10-CM | POA: Diagnosis not present

## 2022-09-21 DIAGNOSIS — N3281 Overactive bladder: Secondary | ICD-10-CM | POA: Diagnosis not present

## 2022-09-21 DIAGNOSIS — E785 Hyperlipidemia, unspecified: Secondary | ICD-10-CM | POA: Diagnosis not present

## 2022-09-21 DIAGNOSIS — R0981 Nasal congestion: Secondary | ICD-10-CM | POA: Diagnosis not present

## 2022-09-21 DIAGNOSIS — M549 Dorsalgia, unspecified: Secondary | ICD-10-CM | POA: Diagnosis not present

## 2022-09-21 DIAGNOSIS — I4891 Unspecified atrial fibrillation: Secondary | ICD-10-CM | POA: Diagnosis not present

## 2022-09-21 DIAGNOSIS — D638 Anemia in other chronic diseases classified elsewhere: Secondary | ICD-10-CM | POA: Diagnosis not present

## 2022-09-22 DIAGNOSIS — R059 Cough, unspecified: Secondary | ICD-10-CM | POA: Diagnosis not present

## 2022-09-22 DIAGNOSIS — I509 Heart failure, unspecified: Secondary | ICD-10-CM | POA: Diagnosis not present

## 2022-09-22 DIAGNOSIS — M545 Low back pain, unspecified: Secondary | ICD-10-CM | POA: Diagnosis not present

## 2022-09-22 DIAGNOSIS — I517 Cardiomegaly: Secondary | ICD-10-CM | POA: Diagnosis not present

## 2022-09-22 DIAGNOSIS — M6281 Muscle weakness (generalized): Secondary | ICD-10-CM | POA: Diagnosis not present

## 2022-09-22 DIAGNOSIS — J9611 Chronic respiratory failure with hypoxia: Secondary | ICD-10-CM | POA: Diagnosis not present

## 2022-09-22 DIAGNOSIS — E119 Type 2 diabetes mellitus without complications: Secondary | ICD-10-CM | POA: Diagnosis not present

## 2022-09-23 DIAGNOSIS — M545 Low back pain, unspecified: Secondary | ICD-10-CM | POA: Diagnosis not present

## 2022-09-23 DIAGNOSIS — I509 Heart failure, unspecified: Secondary | ICD-10-CM | POA: Diagnosis not present

## 2022-09-23 DIAGNOSIS — I10 Essential (primary) hypertension: Secondary | ICD-10-CM | POA: Diagnosis not present

## 2022-09-23 DIAGNOSIS — E119 Type 2 diabetes mellitus without complications: Secondary | ICD-10-CM | POA: Diagnosis not present

## 2022-09-23 DIAGNOSIS — M6281 Muscle weakness (generalized): Secondary | ICD-10-CM | POA: Diagnosis not present

## 2022-09-23 DIAGNOSIS — J9611 Chronic respiratory failure with hypoxia: Secondary | ICD-10-CM | POA: Diagnosis not present

## 2022-09-24 DIAGNOSIS — I10 Essential (primary) hypertension: Secondary | ICD-10-CM | POA: Diagnosis not present

## 2022-09-24 DIAGNOSIS — D638 Anemia in other chronic diseases classified elsewhere: Secondary | ICD-10-CM | POA: Diagnosis not present

## 2022-09-24 DIAGNOSIS — J309 Allergic rhinitis, unspecified: Secondary | ICD-10-CM | POA: Diagnosis not present

## 2022-09-24 DIAGNOSIS — I5030 Unspecified diastolic (congestive) heart failure: Secondary | ICD-10-CM | POA: Diagnosis not present

## 2022-09-24 DIAGNOSIS — M549 Dorsalgia, unspecified: Secondary | ICD-10-CM | POA: Diagnosis not present

## 2022-09-24 DIAGNOSIS — E785 Hyperlipidemia, unspecified: Secondary | ICD-10-CM | POA: Diagnosis not present

## 2022-09-24 DIAGNOSIS — R0989 Other specified symptoms and signs involving the circulatory and respiratory systems: Secondary | ICD-10-CM | POA: Diagnosis not present

## 2022-09-24 DIAGNOSIS — N3281 Overactive bladder: Secondary | ICD-10-CM | POA: Diagnosis not present

## 2022-09-24 DIAGNOSIS — I4891 Unspecified atrial fibrillation: Secondary | ICD-10-CM | POA: Diagnosis not present

## 2022-09-24 DIAGNOSIS — R0981 Nasal congestion: Secondary | ICD-10-CM | POA: Diagnosis not present

## 2022-09-24 DIAGNOSIS — K219 Gastro-esophageal reflux disease without esophagitis: Secondary | ICD-10-CM | POA: Diagnosis not present

## 2022-09-25 DIAGNOSIS — E119 Type 2 diabetes mellitus without complications: Secondary | ICD-10-CM | POA: Diagnosis not present

## 2022-09-25 DIAGNOSIS — I10 Essential (primary) hypertension: Secondary | ICD-10-CM | POA: Diagnosis not present

## 2022-09-28 DIAGNOSIS — R270 Ataxia, unspecified: Secondary | ICD-10-CM | POA: Diagnosis not present

## 2022-09-28 DIAGNOSIS — J449 Chronic obstructive pulmonary disease, unspecified: Secondary | ICD-10-CM | POA: Diagnosis not present

## 2022-09-28 DIAGNOSIS — I509 Heart failure, unspecified: Secondary | ICD-10-CM | POA: Diagnosis not present

## 2022-09-30 DIAGNOSIS — J449 Chronic obstructive pulmonary disease, unspecified: Secondary | ICD-10-CM | POA: Diagnosis not present

## 2022-09-30 DIAGNOSIS — I509 Heart failure, unspecified: Secondary | ICD-10-CM | POA: Diagnosis not present

## 2022-09-30 DIAGNOSIS — R270 Ataxia, unspecified: Secondary | ICD-10-CM | POA: Diagnosis not present

## 2022-09-30 IMAGING — DX DG CHEST 1V PORT
1 series · 1 of 1 positions shown · non-contrast
Comparison: December 03, 2020

CLINICAL DATA: Tachycardia

EXAM:
PORTABLE CHEST 1 VIEW

[chest ap]
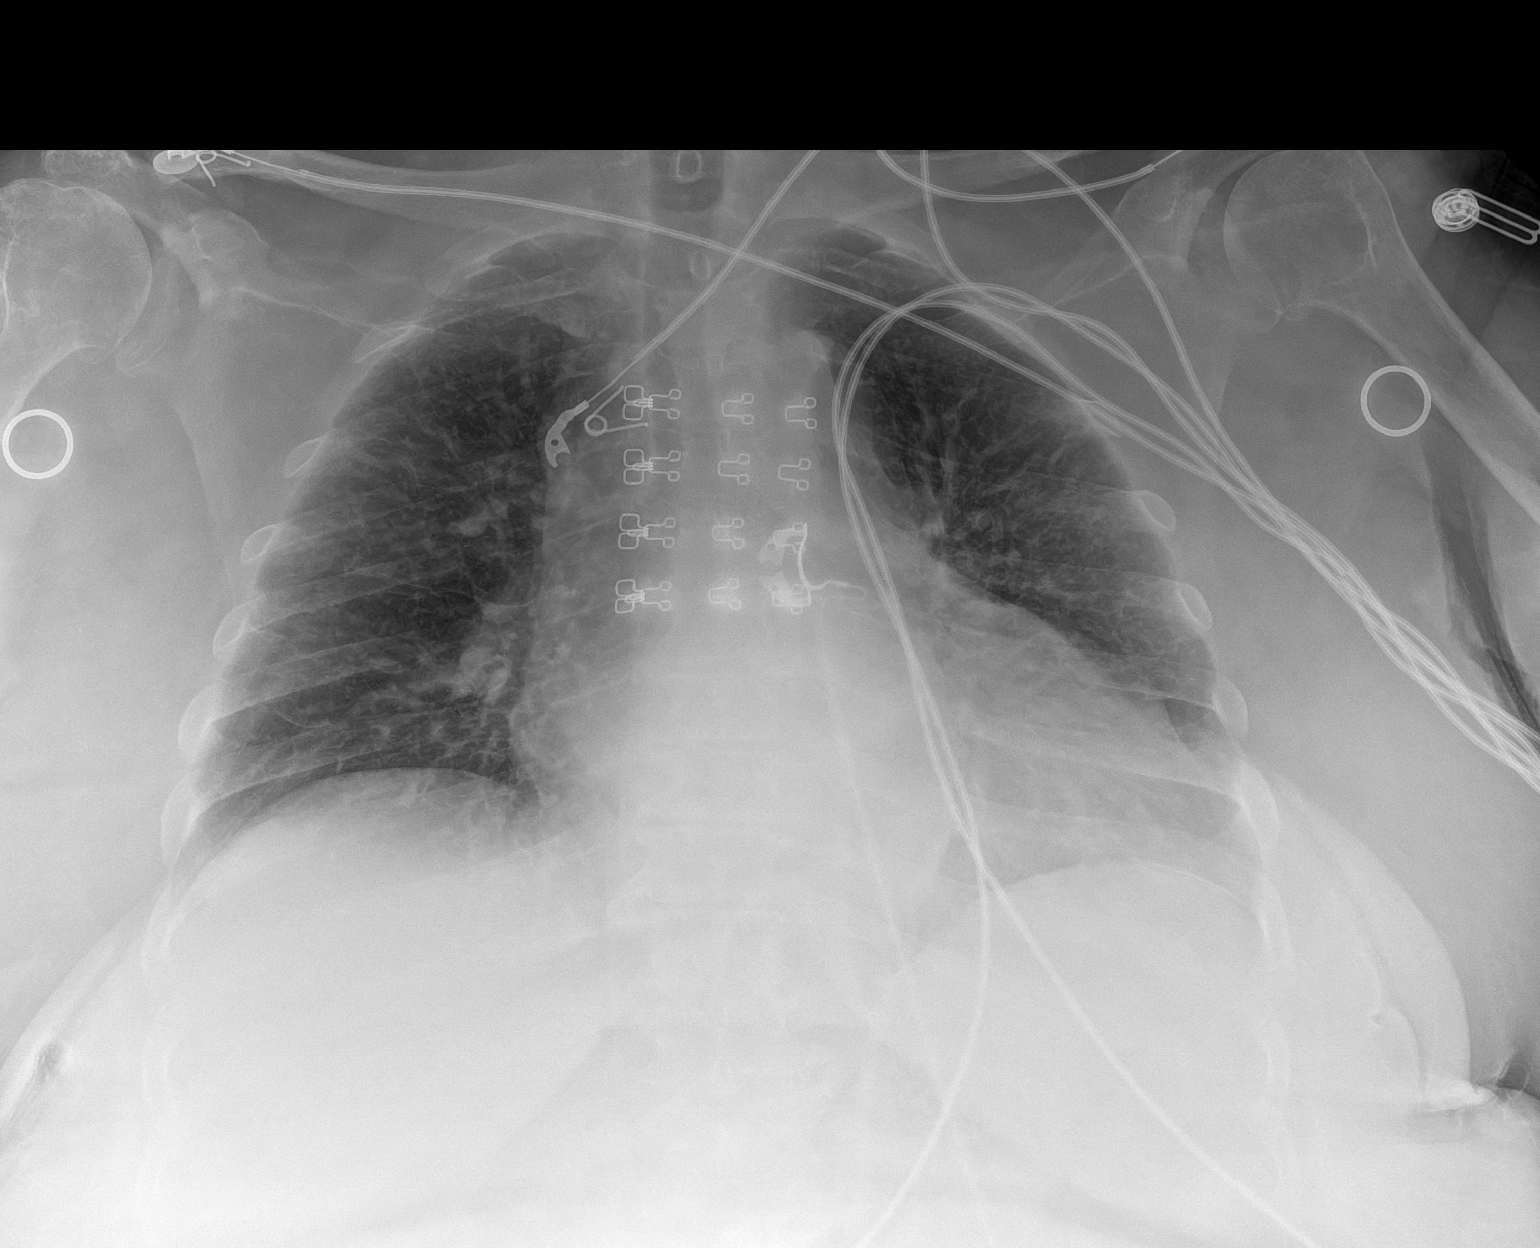

[1 of 1 positions shown; findings below may reference images not displayed]

FINDINGS: The lungs are clear. There is cardiomegaly with pulmonary
vascularity normal. No adenopathy. No bone lesions.
IMPRESSION: Cardiomegaly.  No edema or airspace opacity.

## 2022-10-02 IMAGING — CT CT HEAD W/O CM
4 of 5 series · 16 of 47 positions shown, 18 images · non-contrast
Comparison: CT 08/13/2020, MRI 08/15/2020

CLINICAL DATA: Delirium altered

EXAM:
CT HEAD WITHOUT CONTRAST
TECHNIQUE: Contiguous axial images were obtained from the base of the skull
through the vertex without intravenous contrast.

[Series 2: head w o · axial · 0.41mm/px · z∈[-11,+74]mm · 4 of 29 slices shown]
[im 6/29  brain]
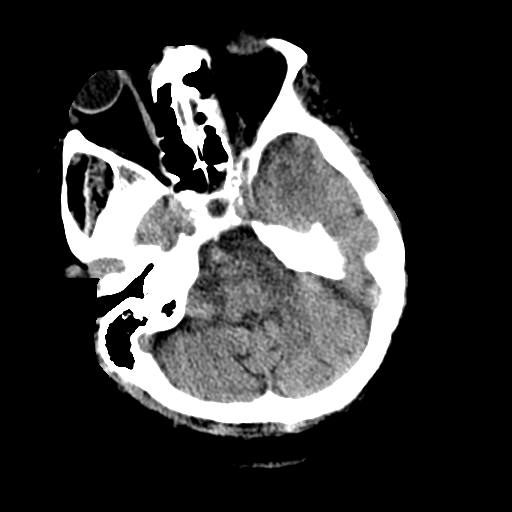
[im 12/29  brain]
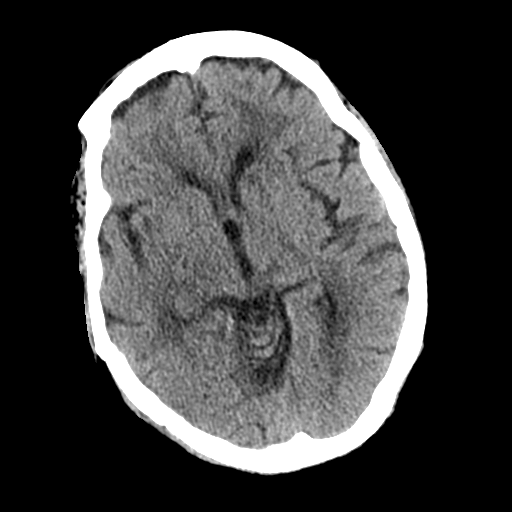
[im 17/29  brain]
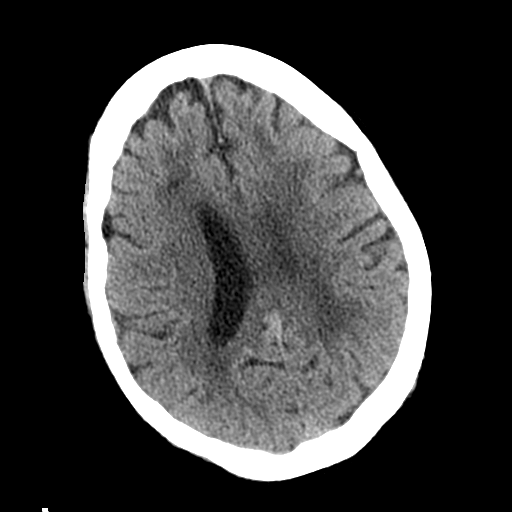
[im 23/29  brain]
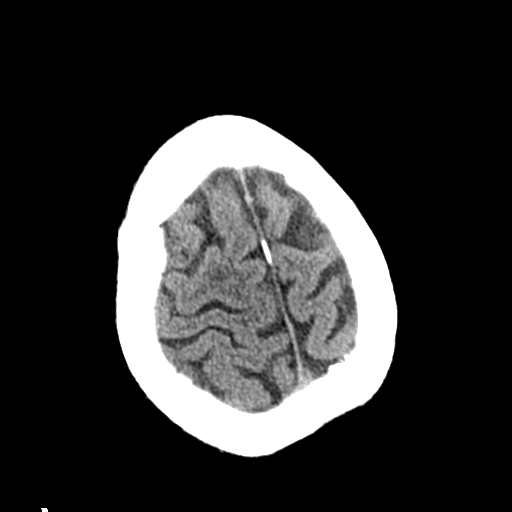

[Series 4: coronal soft · coronal · 0.36mm/px · 3 of 70 slices shown]
[im 24/70  brain]
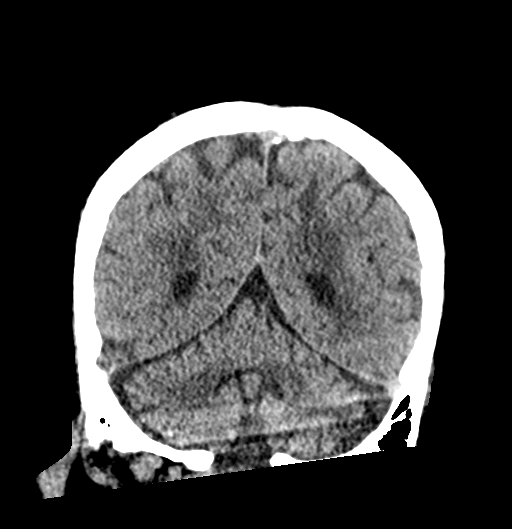
[im 31/70  brain]
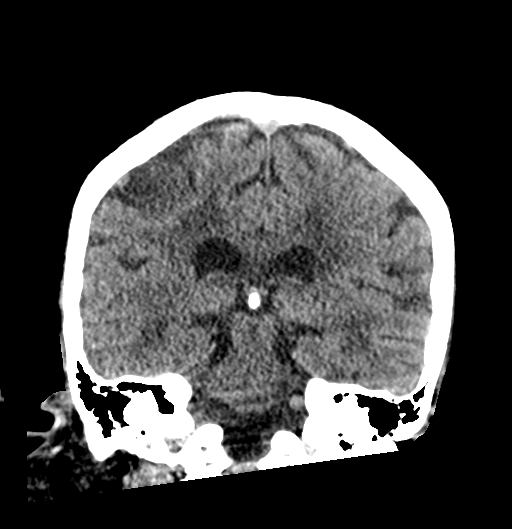
[im 39/70  brain]
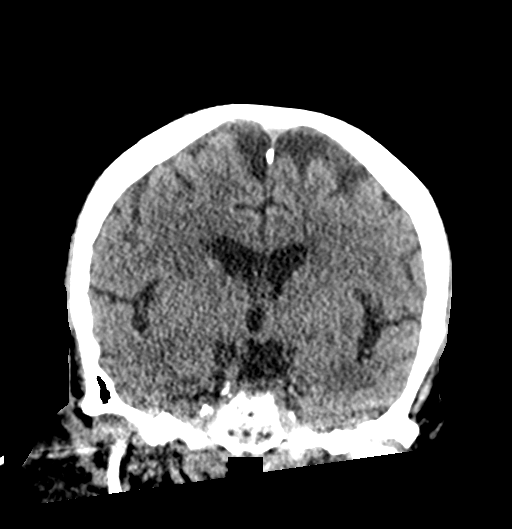

[Series 5: sagittal soft · sagittal · 0.33mm/px · 3 of 59 slices shown]
[im 20/59  brain]
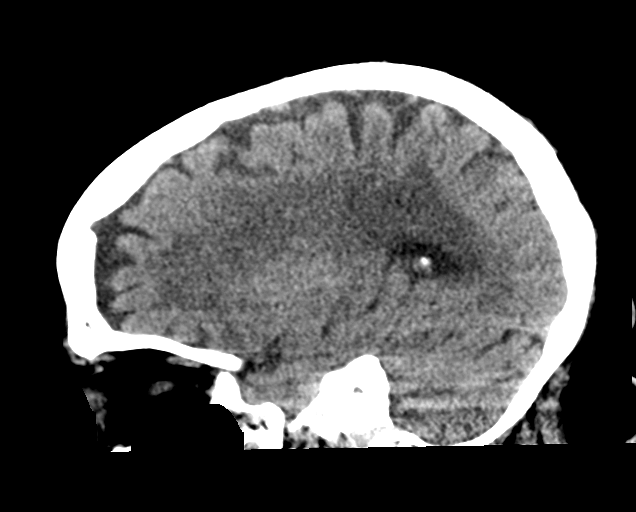
[im 28/59  brain]
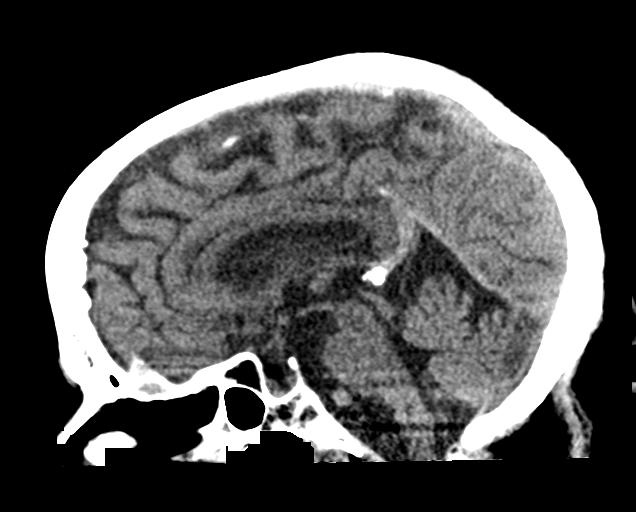
[im 35/59  brain]
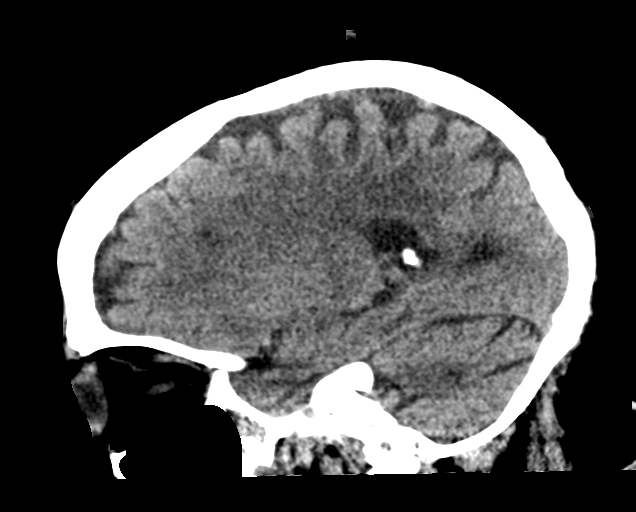

[Series 6: head ax w o · axial · 0.36mm/px · z∈[-36,+83]mm · 6 of 34 slices shown, 8 images]
[im 5/34  brain]
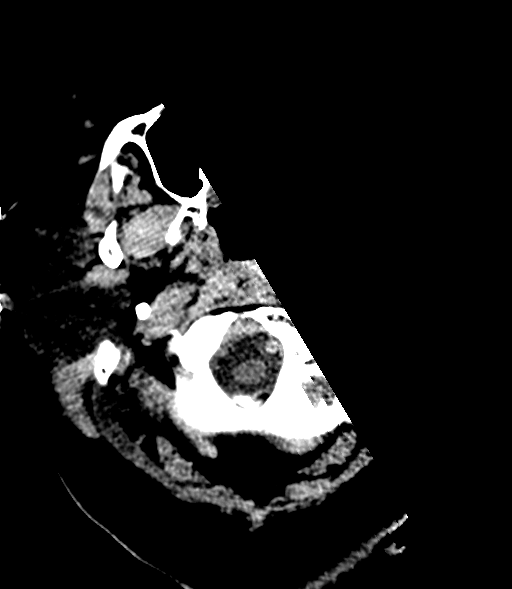
[im 5/34  bone]
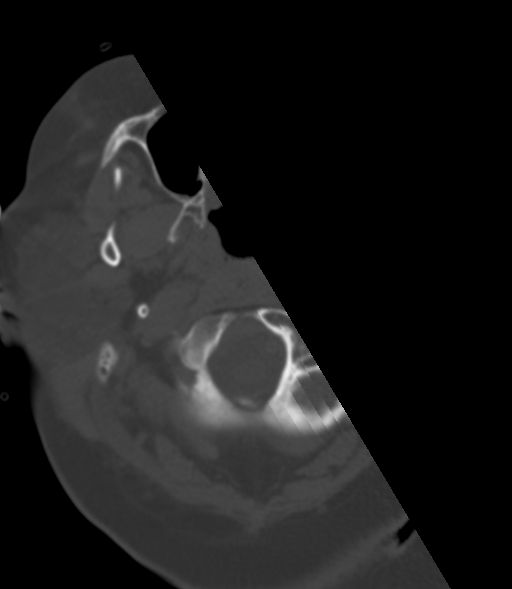
[im 10/34  brain]
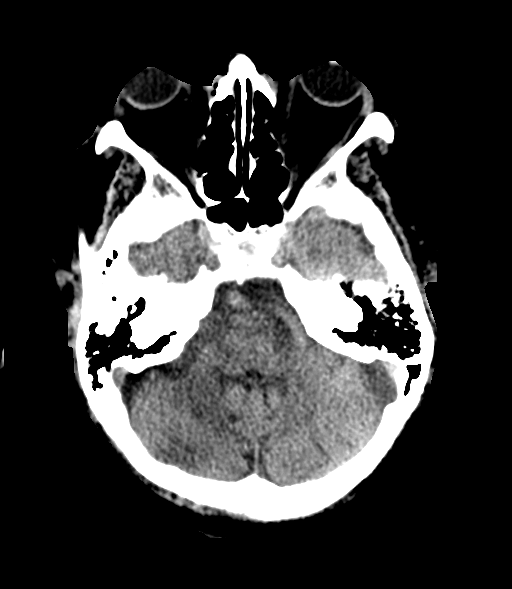
[im 15/34  brain]
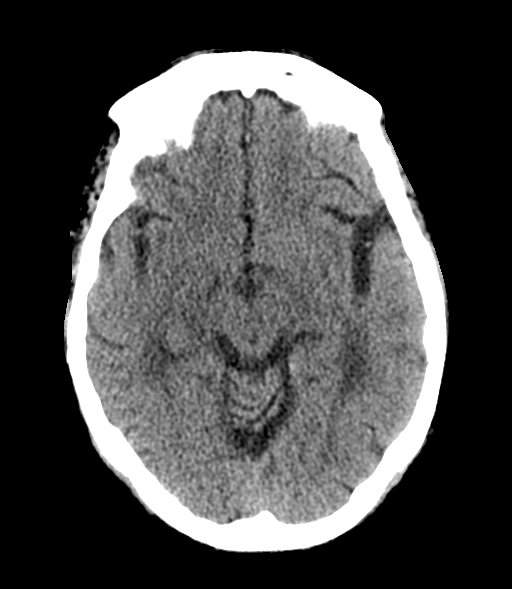
[im 19/34  brain]
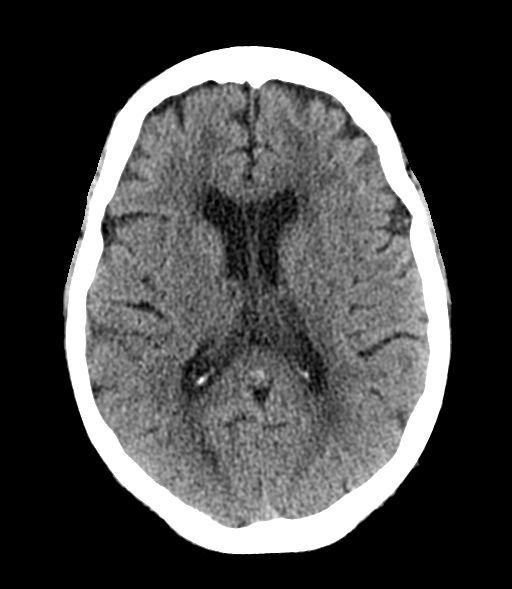
[im 24/34  brain]
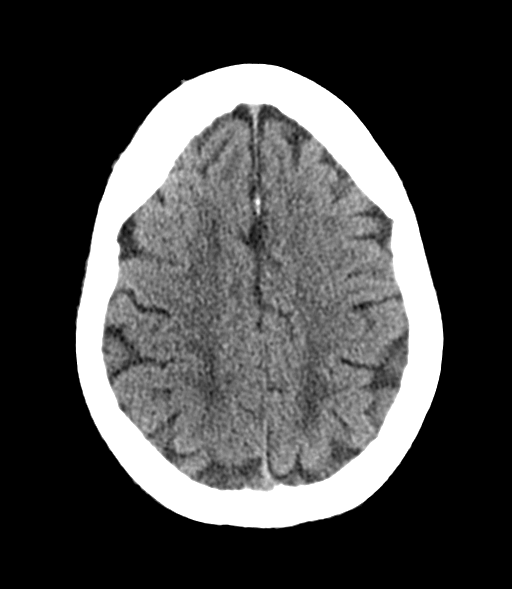
[im 24/34  bone]
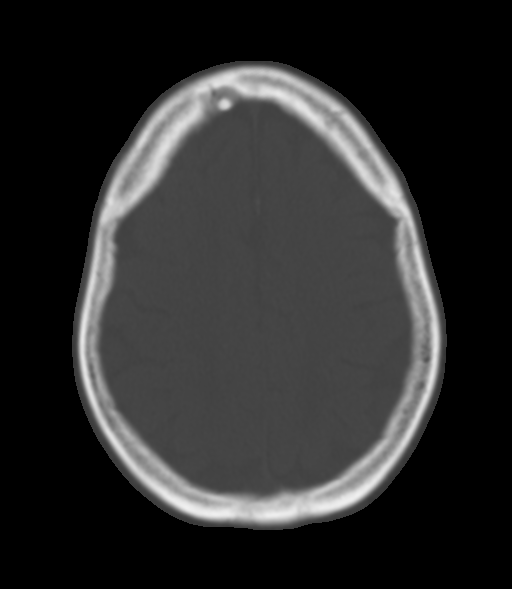
[im 29/34  brain]
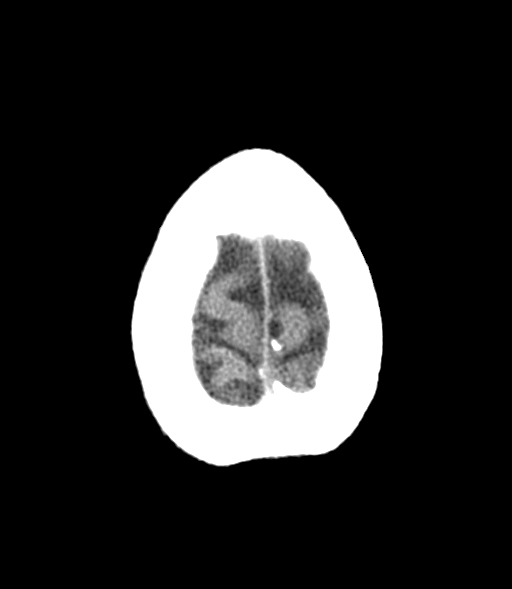

[16 of 47 positions shown; findings below may reference images not displayed]

FINDINGS: Brain: No acute territorial infarction, hemorrhage, or intracranial
mass. Mild atrophy. Chronic lacunar infarct within the left thalamus
and left caudate. Moderate patchy white matter hypodensity
consistent with chronic small vessel ischemic change. Stable
ventricle size.

Vascular: No hyperdense vessels.  Carotid vascular calcification

Skull: Normal. Negative for fracture or focal lesion.

Sinuses/Orbits: Old left medial orbital wall fracture. Mild mucosal
thickening in the sinuses.

Other: None
IMPRESSION: 1. No CT evidence for acute intracranial abnormality.
2. Atrophy and chronic small vessel ischemic changes.

## 2022-10-05 DIAGNOSIS — J449 Chronic obstructive pulmonary disease, unspecified: Secondary | ICD-10-CM | POA: Diagnosis not present

## 2022-10-05 DIAGNOSIS — R270 Ataxia, unspecified: Secondary | ICD-10-CM | POA: Diagnosis not present

## 2022-10-05 DIAGNOSIS — I509 Heart failure, unspecified: Secondary | ICD-10-CM | POA: Diagnosis not present

## 2022-10-06 DIAGNOSIS — I509 Heart failure, unspecified: Secondary | ICD-10-CM | POA: Diagnosis not present

## 2022-10-06 DIAGNOSIS — J449 Chronic obstructive pulmonary disease, unspecified: Secondary | ICD-10-CM | POA: Diagnosis not present

## 2022-10-06 DIAGNOSIS — R270 Ataxia, unspecified: Secondary | ICD-10-CM | POA: Diagnosis not present

## 2022-10-07 DIAGNOSIS — I10 Essential (primary) hypertension: Secondary | ICD-10-CM | POA: Diagnosis not present

## 2022-10-07 DIAGNOSIS — E1151 Type 2 diabetes mellitus with diabetic peripheral angiopathy without gangrene: Secondary | ICD-10-CM | POA: Diagnosis not present

## 2022-10-07 DIAGNOSIS — I5030 Unspecified diastolic (congestive) heart failure: Secondary | ICD-10-CM | POA: Diagnosis not present

## 2022-10-07 DIAGNOSIS — I509 Heart failure, unspecified: Secondary | ICD-10-CM | POA: Diagnosis not present

## 2022-10-07 DIAGNOSIS — R270 Ataxia, unspecified: Secondary | ICD-10-CM | POA: Diagnosis not present

## 2022-10-07 DIAGNOSIS — E785 Hyperlipidemia, unspecified: Secondary | ICD-10-CM | POA: Diagnosis not present

## 2022-10-07 DIAGNOSIS — I4891 Unspecified atrial fibrillation: Secondary | ICD-10-CM | POA: Diagnosis not present

## 2022-10-07 DIAGNOSIS — D638 Anemia in other chronic diseases classified elsewhere: Secondary | ICD-10-CM | POA: Diagnosis not present

## 2022-10-07 DIAGNOSIS — J449 Chronic obstructive pulmonary disease, unspecified: Secondary | ICD-10-CM | POA: Diagnosis not present

## 2022-10-08 DIAGNOSIS — R270 Ataxia, unspecified: Secondary | ICD-10-CM | POA: Diagnosis not present

## 2022-10-08 DIAGNOSIS — I509 Heart failure, unspecified: Secondary | ICD-10-CM | POA: Diagnosis not present

## 2022-10-08 DIAGNOSIS — J449 Chronic obstructive pulmonary disease, unspecified: Secondary | ICD-10-CM | POA: Diagnosis not present

## 2022-10-12 DIAGNOSIS — R270 Ataxia, unspecified: Secondary | ICD-10-CM | POA: Diagnosis not present

## 2022-10-12 DIAGNOSIS — I509 Heart failure, unspecified: Secondary | ICD-10-CM | POA: Diagnosis not present

## 2022-10-12 DIAGNOSIS — J449 Chronic obstructive pulmonary disease, unspecified: Secondary | ICD-10-CM | POA: Diagnosis not present

## 2022-10-13 IMAGING — CT CT HEAD W/O CM
3 series · 16 of 47 positions shown, 19 images · non-contrast
Comparison: [DATE] [DATE], [DATE], [DATE] [DATE], [DATE]

CLINICAL DATA: Altered mental status

EXAM:
CT HEAD WITHOUT CONTRAST
TECHNIQUE: Contiguous axial images were obtained from the base of the skull
through the vertex without intravenous contrast.

[Series 2: head w o · axial · 0.51mm/px · z∈[+1350,+1485]mm · 10 of 33 slices shown, 13 images]
[im 3/33  brain]
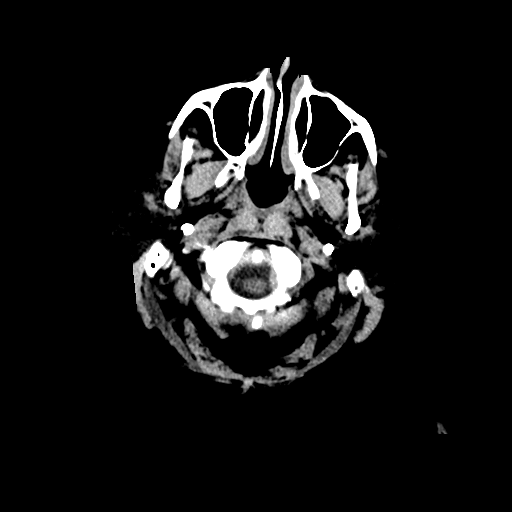
[im 3/33  bone]
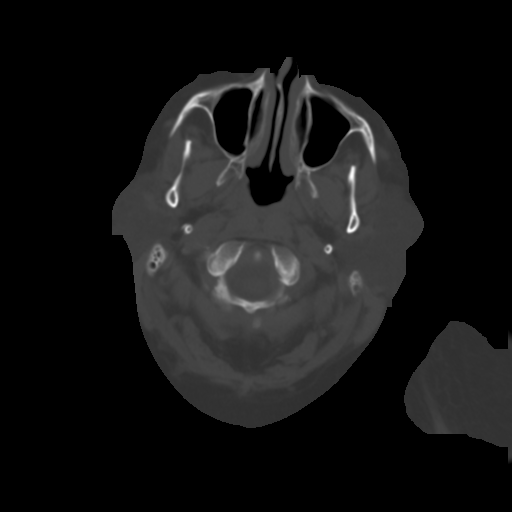
[im 6/33  brain]
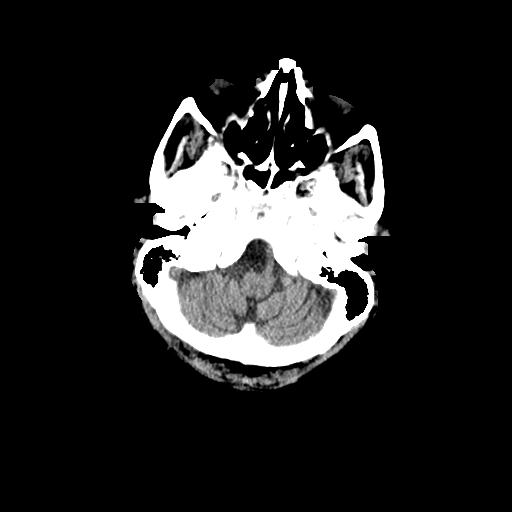
[im 9/33  brain]
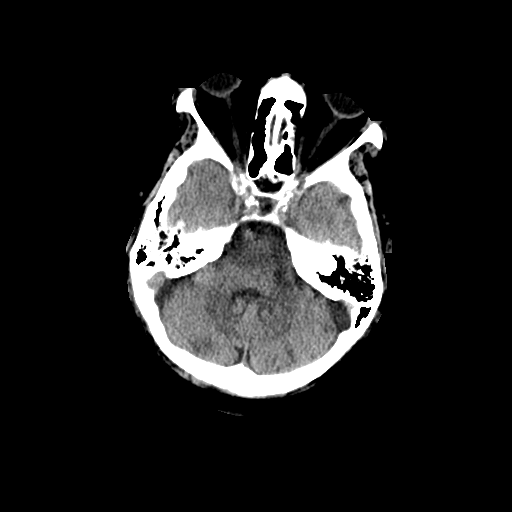
[im 12/33  brain]
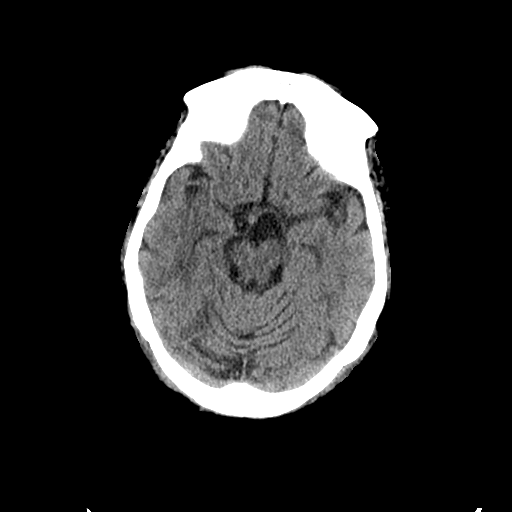
[im 15/33  brain]
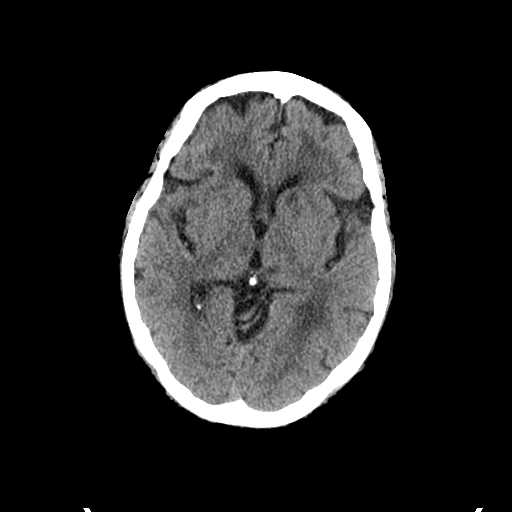
[im 15/33  bone]
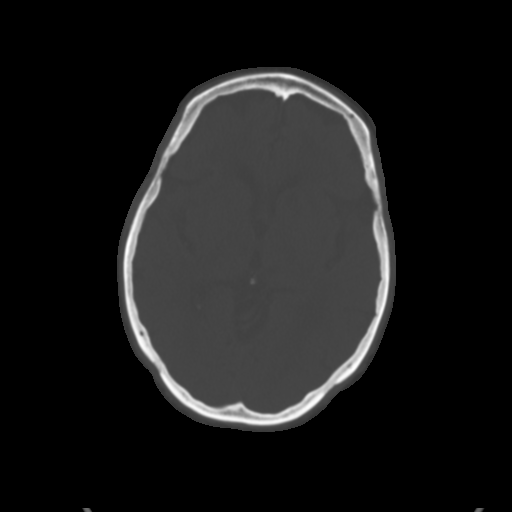
[im 18/33  brain]
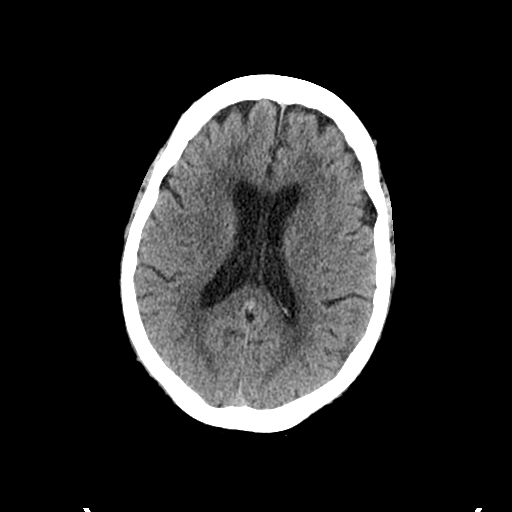
[im 21/33  brain]
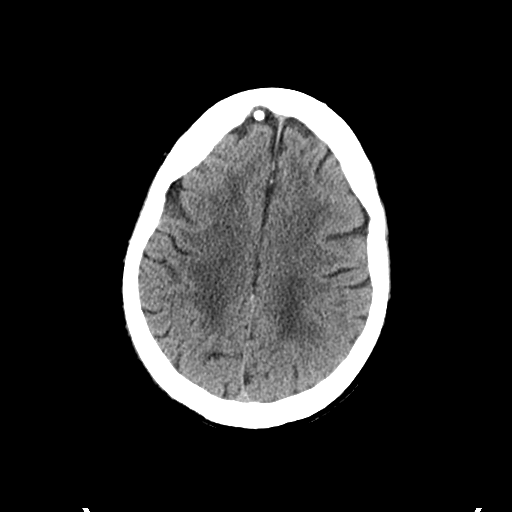
[im 25/33  brain]
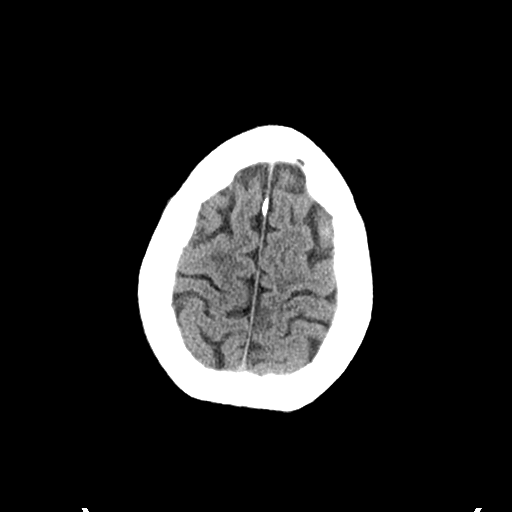
[im 27/33  brain]
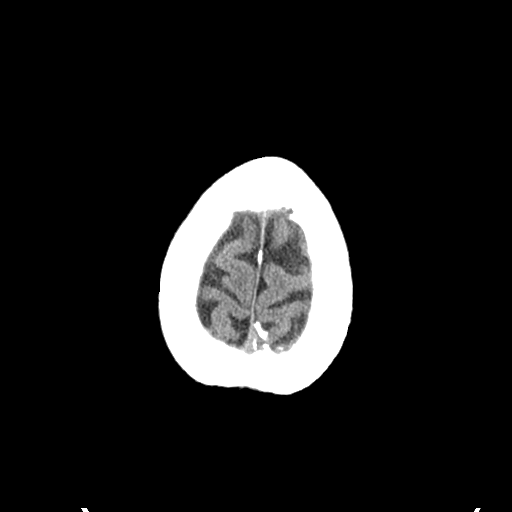
[im 27/33  bone]
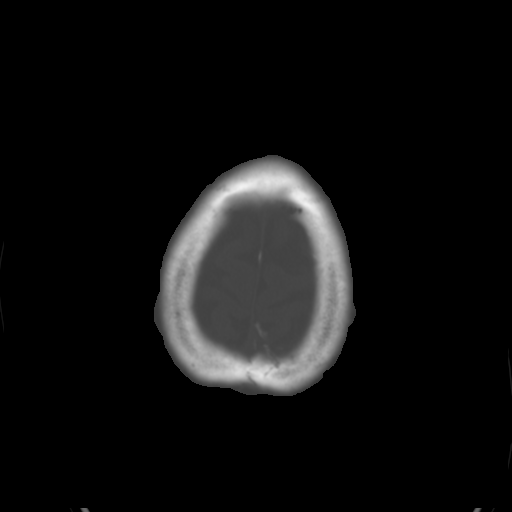
[im 30/33  brain]
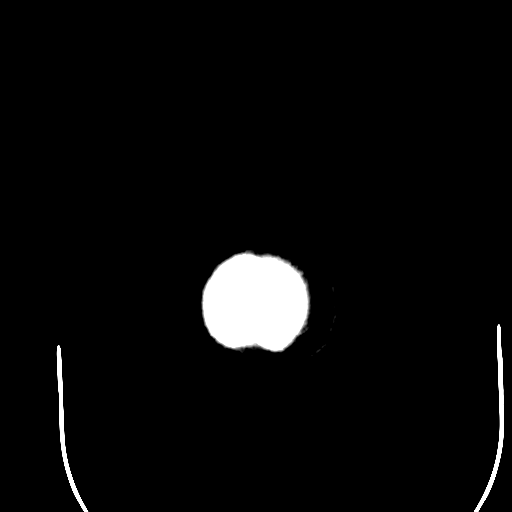

[Series 4: coronal soft · coronal · 0.33mm/px · 3 of 72 slices shown]
[im 24/72  brain]
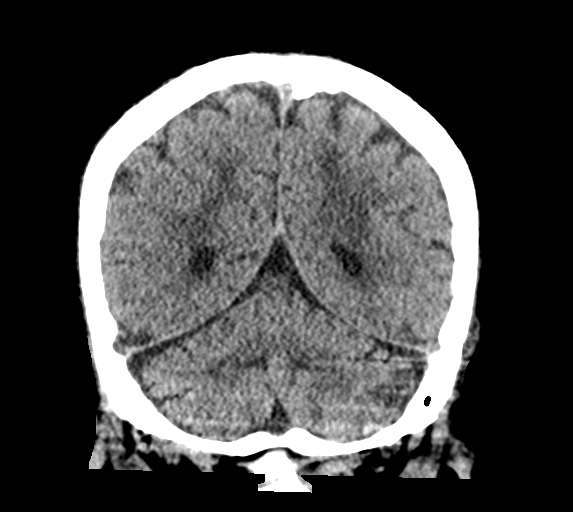
[im 32/72  brain]
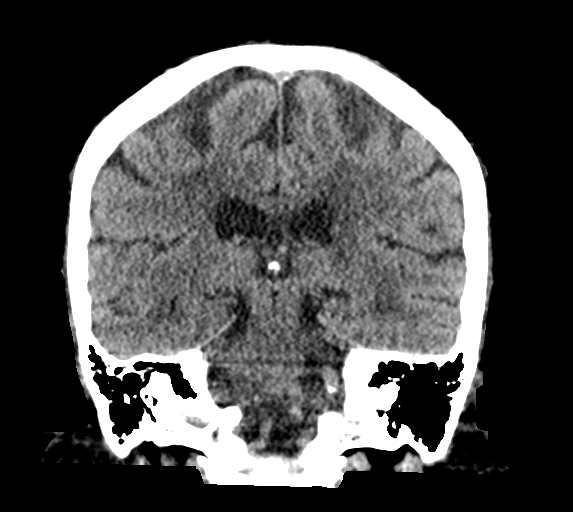
[im 40/72  brain]
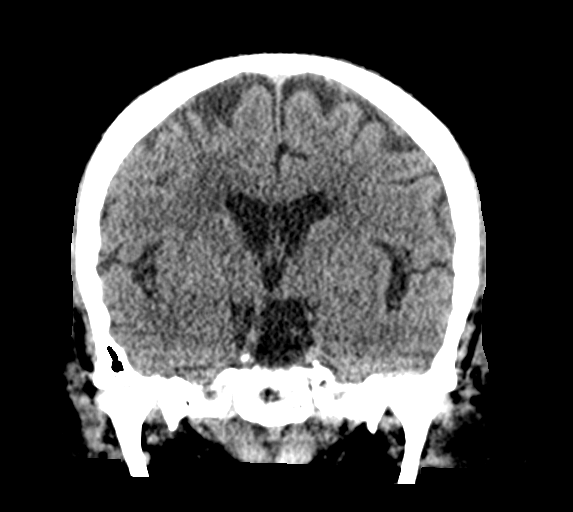

[Series 5: sagittal soft · sagittal · 0.36mm/px · 3 of 61 slices shown]
[im 21/61  brain]
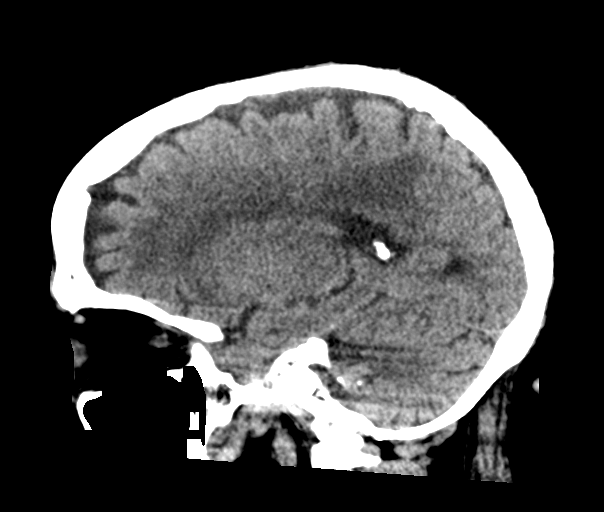
[im 31/61  brain]
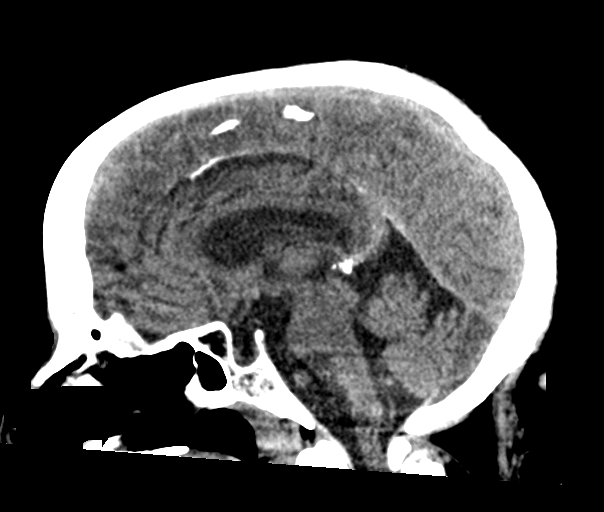
[im 41/61  brain]
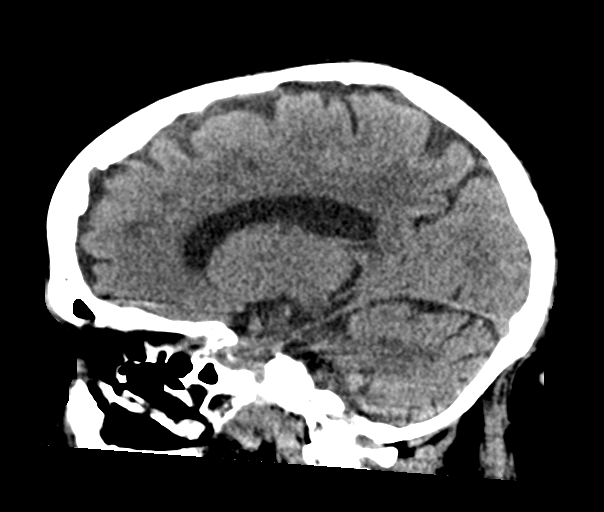

[16 of 47 positions shown; findings below may reference images not displayed]

FINDINGS: Brain: No evidence of acute infarction, hemorrhage, hydrocephalus,
extra-axial collection or mass lesion/mass effect. Periventricular
white matter hypodensities consistent with sequela of chronic
microvascular ischemic disease. Remote lacunar infarction of the
LEFT thalamus and caudate.

Vascular: Vascular calcifications of the carotid siphons.

Skull: No acute fracture.  Remote LEFT medial orbital wall fracture.

Sinuses/Orbits: No acute finding.

Other: None.
IMPRESSION: No acute intracranial abnormality.

## 2022-10-22 ENCOUNTER — Non-Acute Institutional Stay: Payer: Medicare Other | Admitting: Family Medicine

## 2022-10-22 ENCOUNTER — Encounter: Payer: Self-pay | Admitting: Family Medicine

## 2022-10-22 VITALS — BP 156/100 | HR 72 | Temp 98.1°F | Resp 18

## 2022-10-22 DIAGNOSIS — J449 Chronic obstructive pulmonary disease, unspecified: Secondary | ICD-10-CM

## 2022-10-22 DIAGNOSIS — L603 Nail dystrophy: Secondary | ICD-10-CM | POA: Diagnosis not present

## 2022-10-22 DIAGNOSIS — Z515 Encounter for palliative care: Secondary | ICD-10-CM | POA: Insufficient documentation

## 2022-10-22 DIAGNOSIS — B351 Tinea unguium: Secondary | ICD-10-CM | POA: Diagnosis not present

## 2022-10-22 DIAGNOSIS — I10 Essential (primary) hypertension: Secondary | ICD-10-CM

## 2022-10-22 DIAGNOSIS — N189 Chronic kidney disease, unspecified: Secondary | ICD-10-CM | POA: Diagnosis not present

## 2022-10-22 DIAGNOSIS — E1159 Type 2 diabetes mellitus with other circulatory complications: Secondary | ICD-10-CM

## 2022-10-22 DIAGNOSIS — I739 Peripheral vascular disease, unspecified: Secondary | ICD-10-CM | POA: Diagnosis not present

## 2022-10-22 DIAGNOSIS — I70219 Atherosclerosis of native arteries of extremities with intermittent claudication, unspecified extremity: Secondary | ICD-10-CM | POA: Insufficient documentation
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
## 2022-10-23 IMAGING — DX DG HIP (WITH OR WITHOUT PELVIS) 2-3V*L*
3 series · 3 of 3 positions shown · non-contrast
Comparison: None.

CLINICAL DATA: Left hip pain following a fall yesterday.

EXAM:
DG HIP (WITH OR WITHOUT PELVIS) 2-3V LEFT

[hip ap]
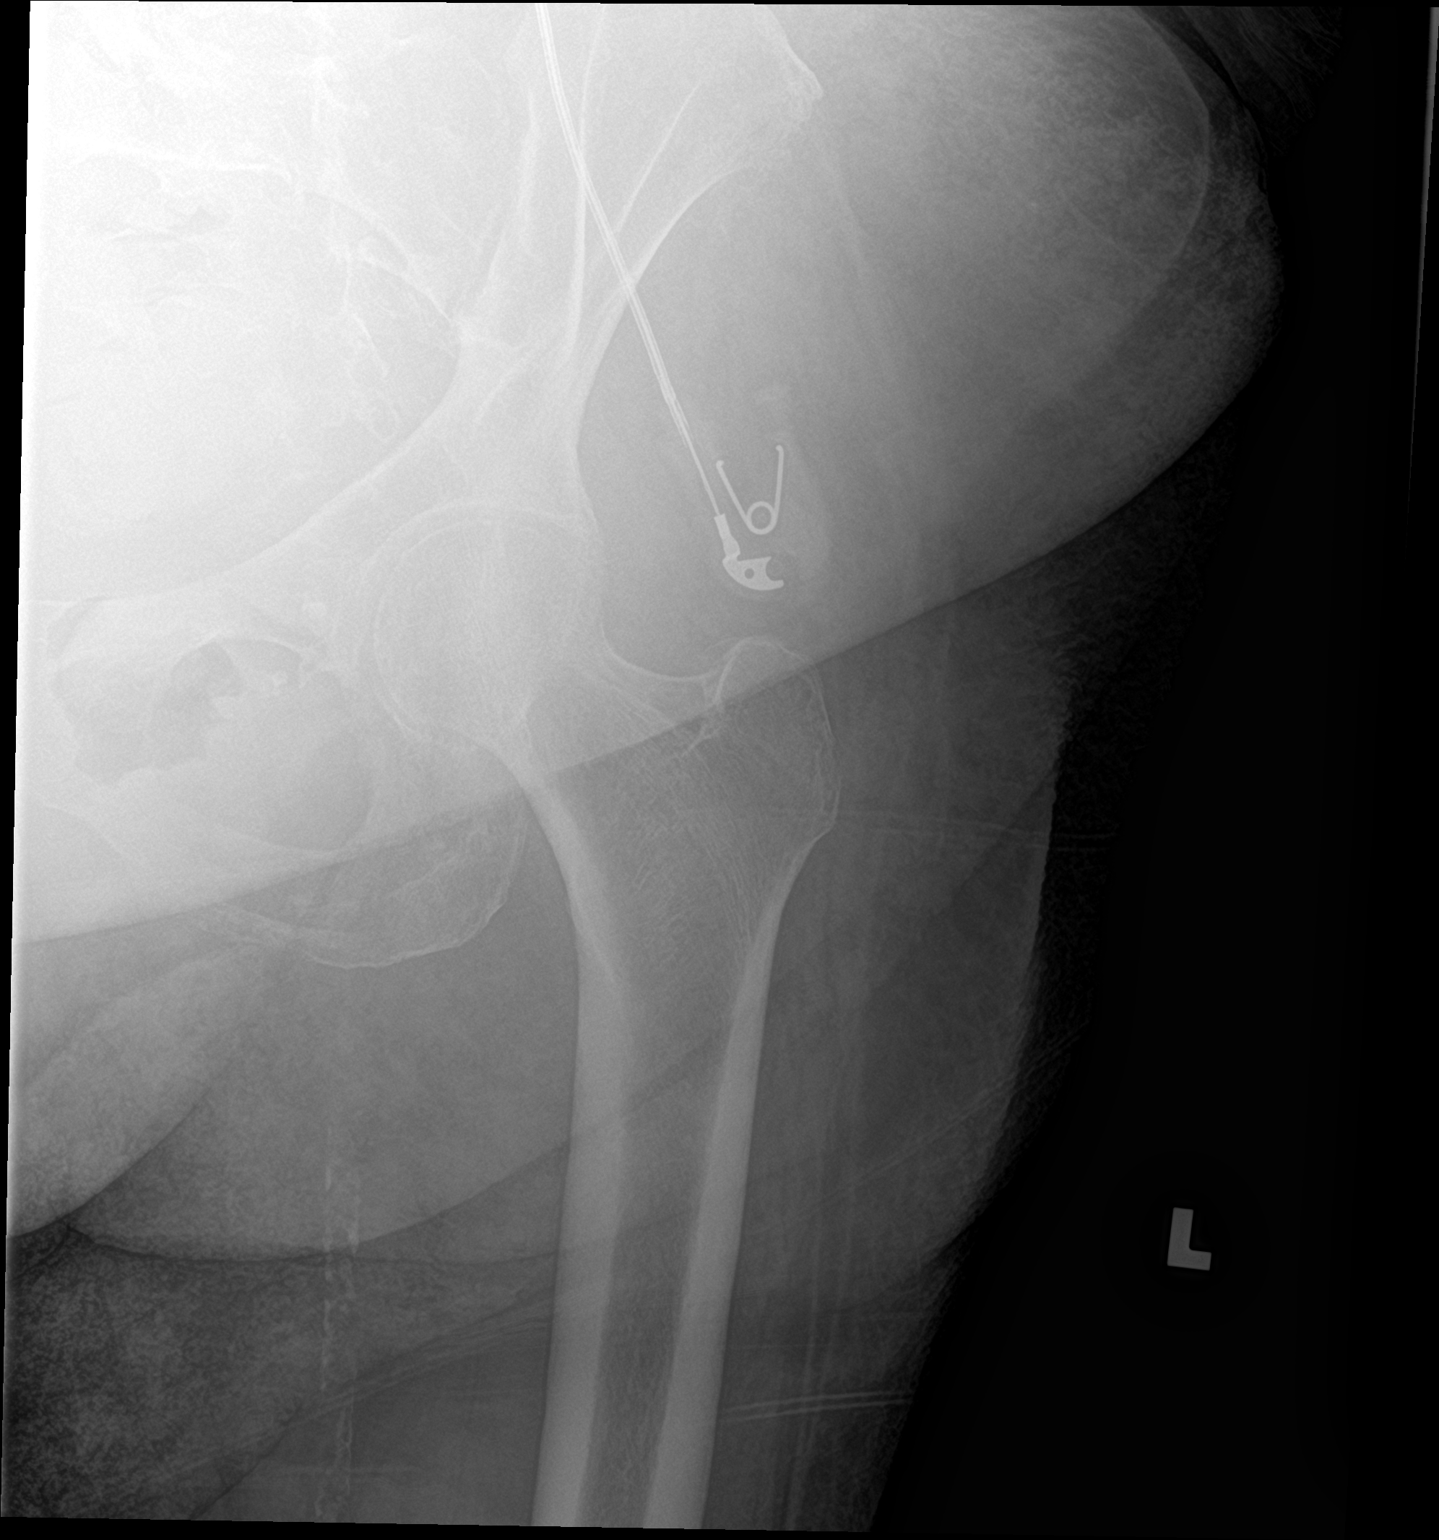

[hip lat]
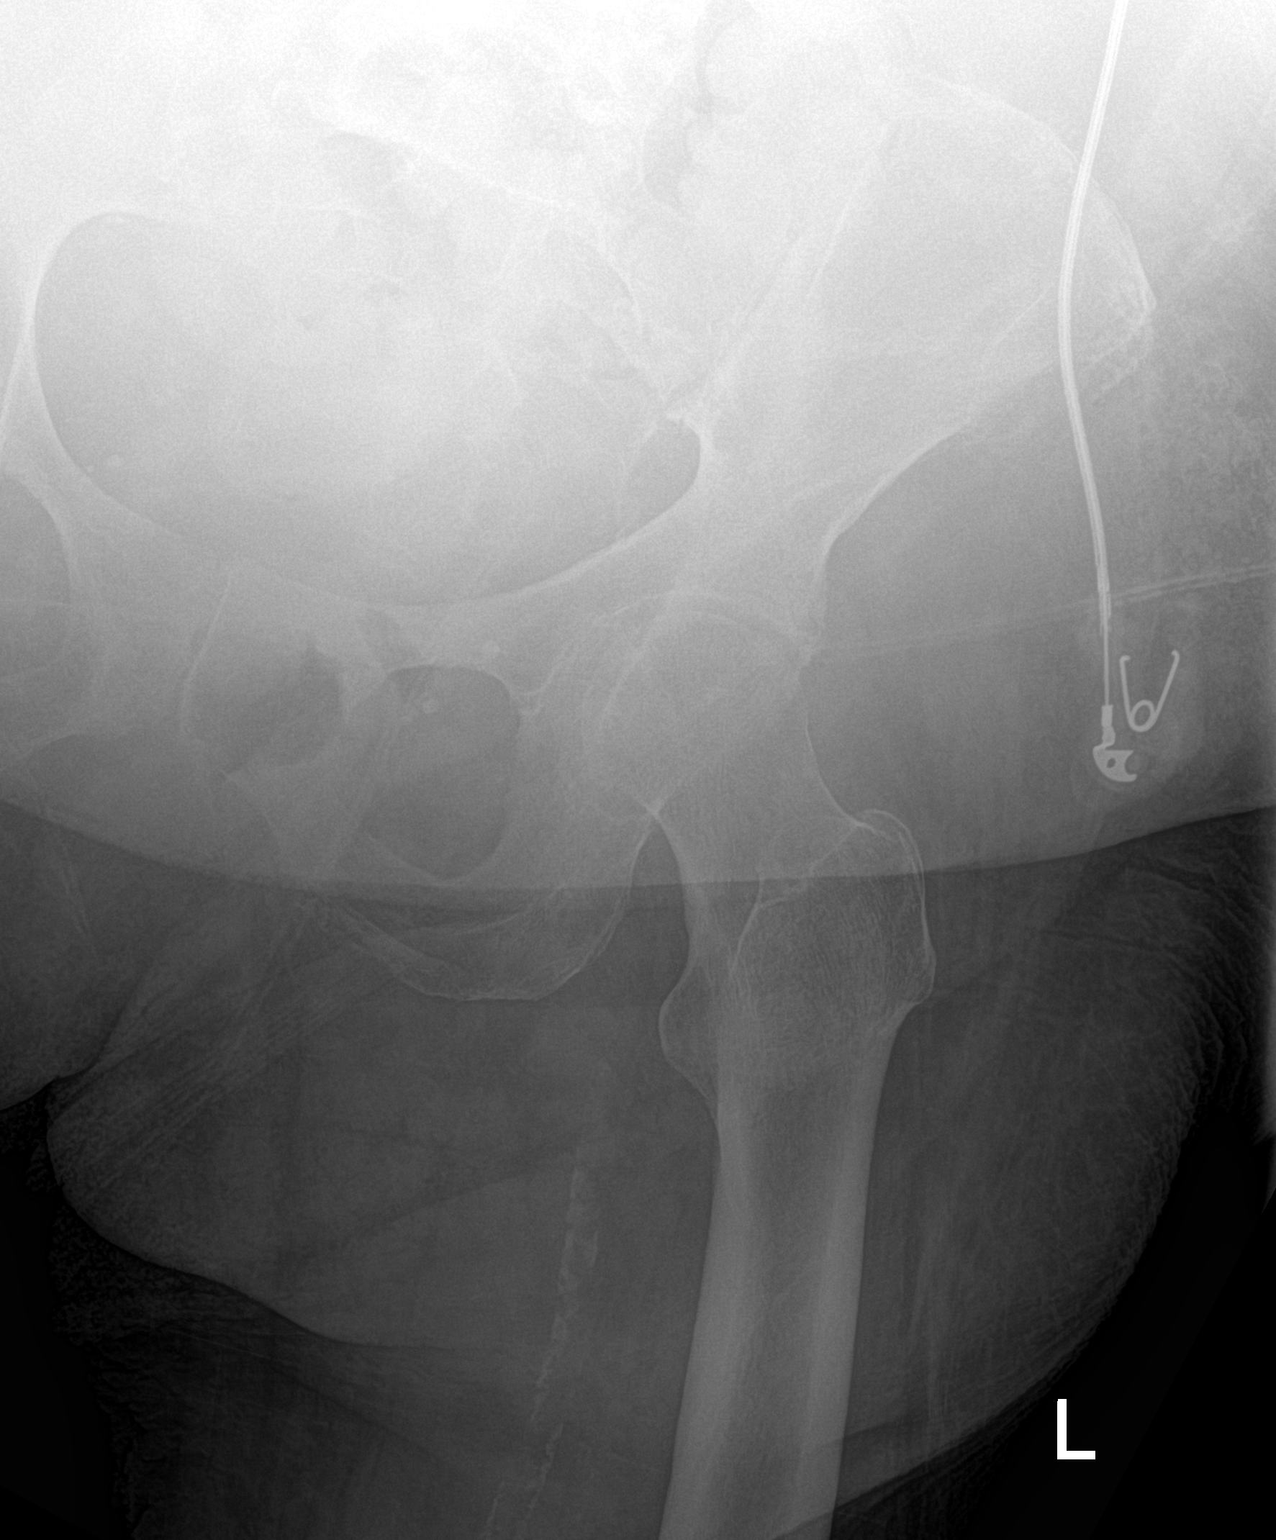

[pelvis ap]
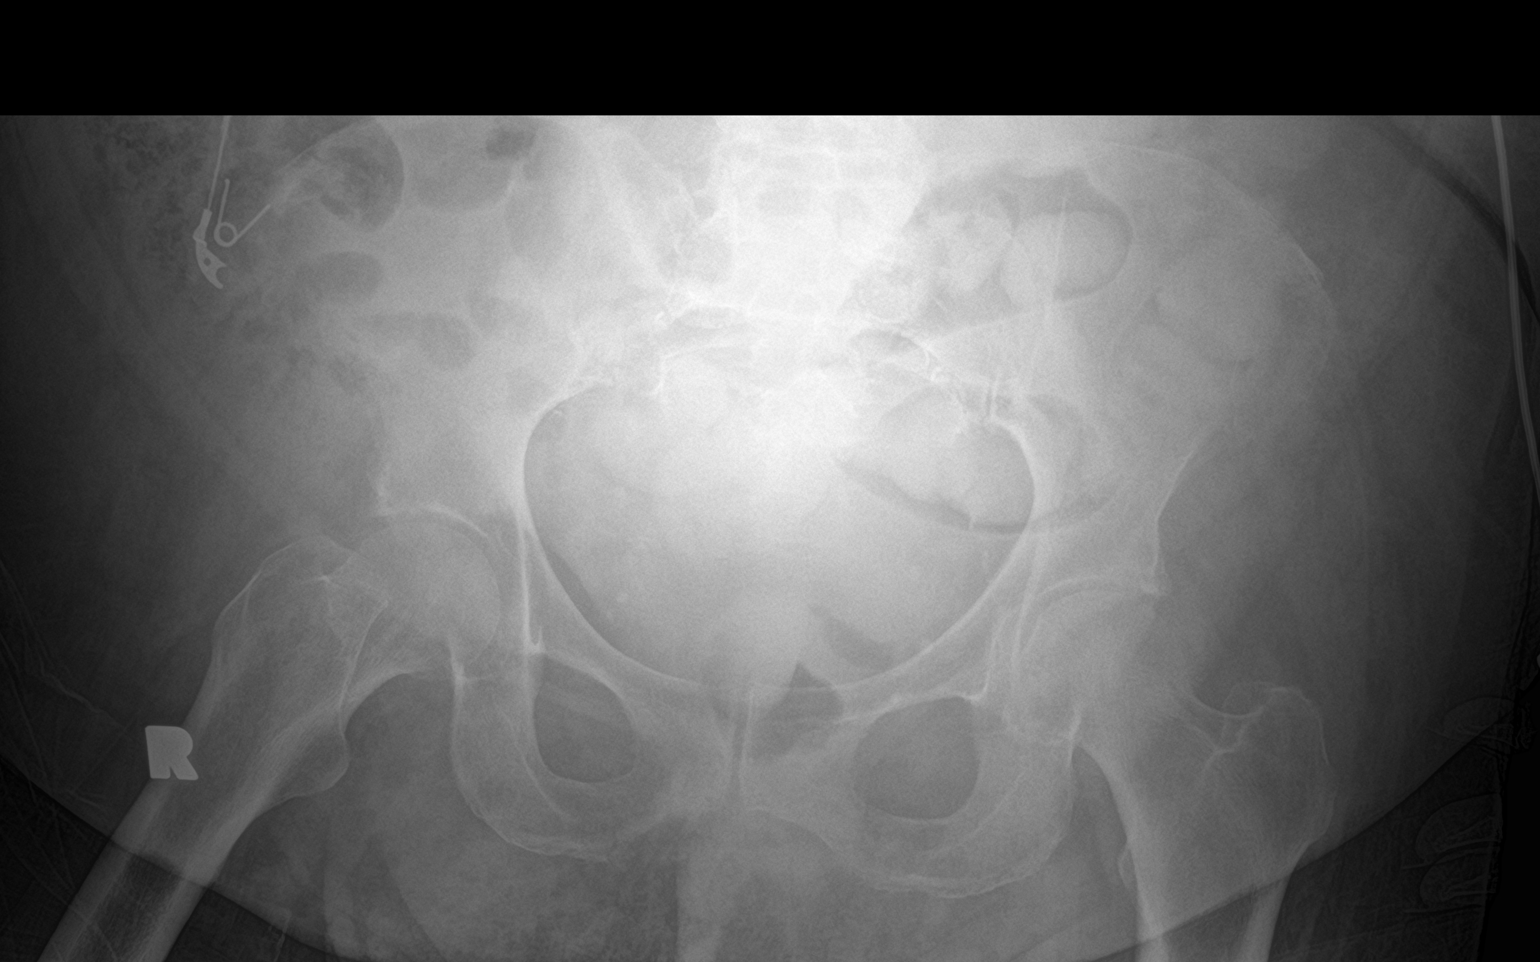

[3 of 3 positions shown; findings below may reference images not displayed]

FINDINGS: Poorly visualized bones due to the thickness of the overlying soft
tissues. No visible fracture or dislocation. Atheromatous arterial
calcifications.
IMPRESSION: No visible fracture or dislocation.

## 2022-10-23 IMAGING — CT CT HEAD W/O CM
1 of 2 series · 12 of 30 positions shown, 15 images · non-contrast
Comparison: March 22, 2021.

CLINICAL DATA: Head injury after fall.

EXAM:
CT HEAD WITHOUT CONTRAST
TECHNIQUE: Contiguous axial images were obtained from the base of the skull
through the vertex without intravenous contrast.

[Series 5: head ax w o · axial · 0.33mm/px · z∈[-26,+111]mm · 12 of 34 slices shown, 15 images]
[im 3/34  brain]
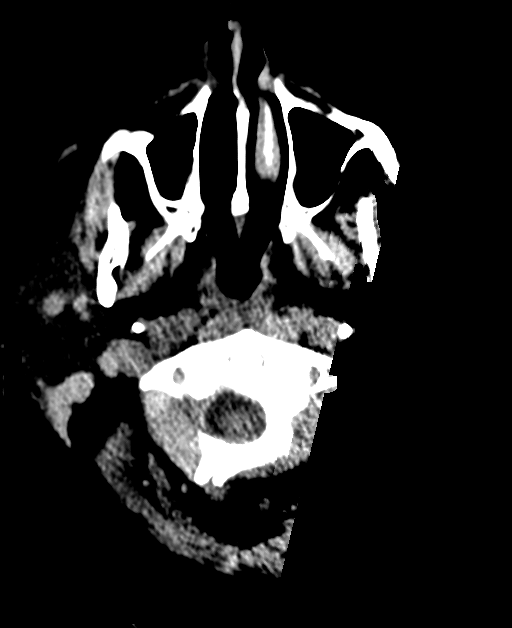
[im 3/34  bone]
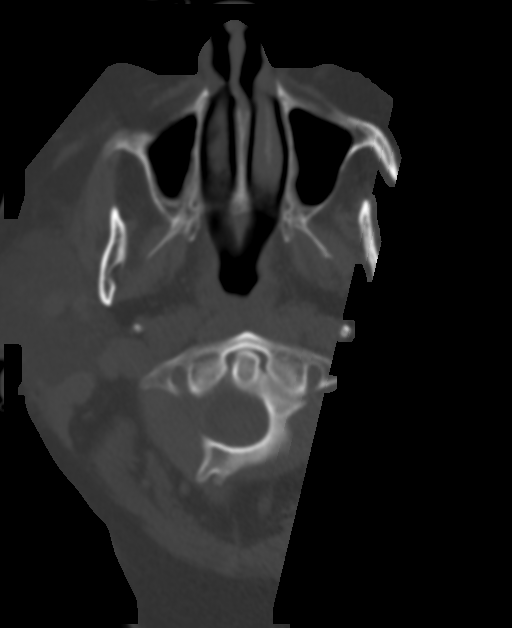
[im 5/34  brain]
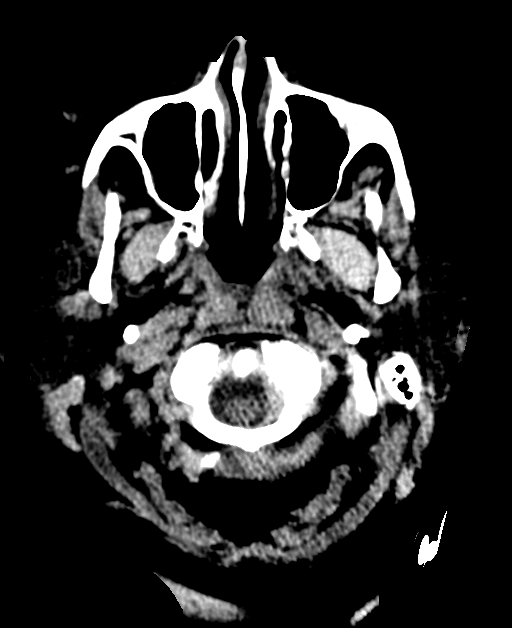
[im 8/34  brain]
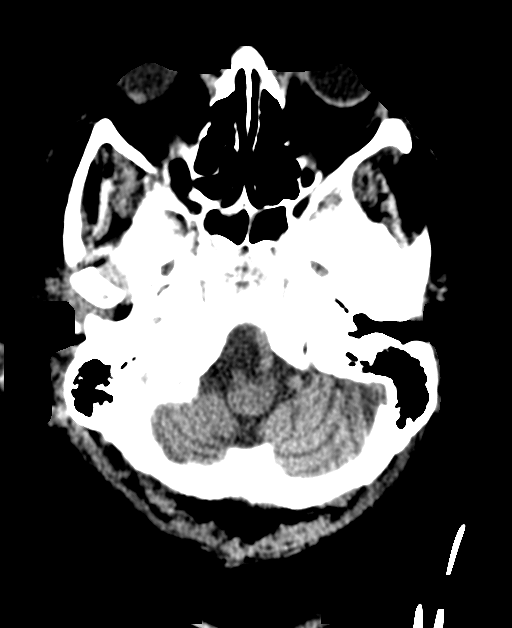
[im 10/34  brain]
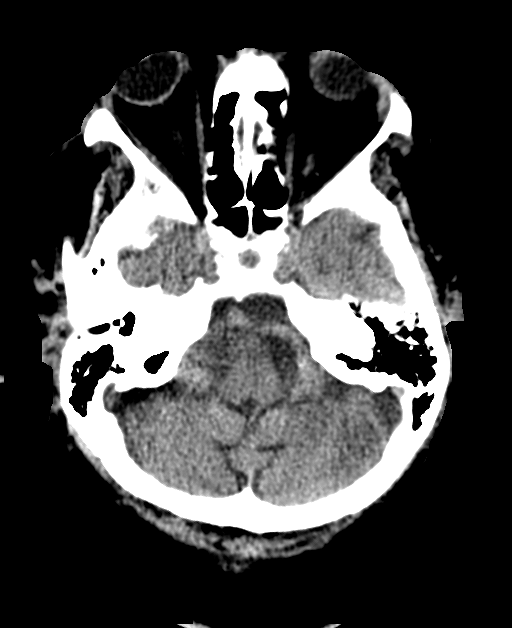
[im 12/34  brain]
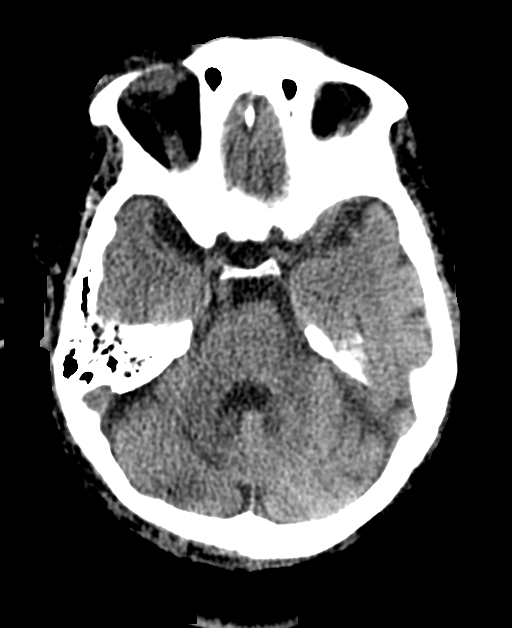
[im 12/34  bone]
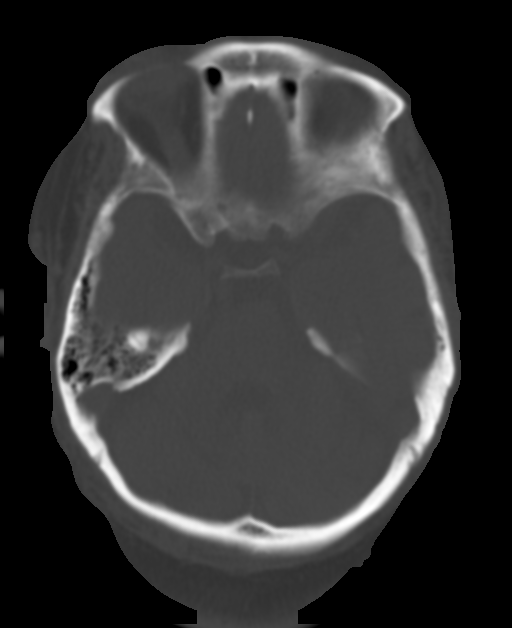
[im 15/34  brain]
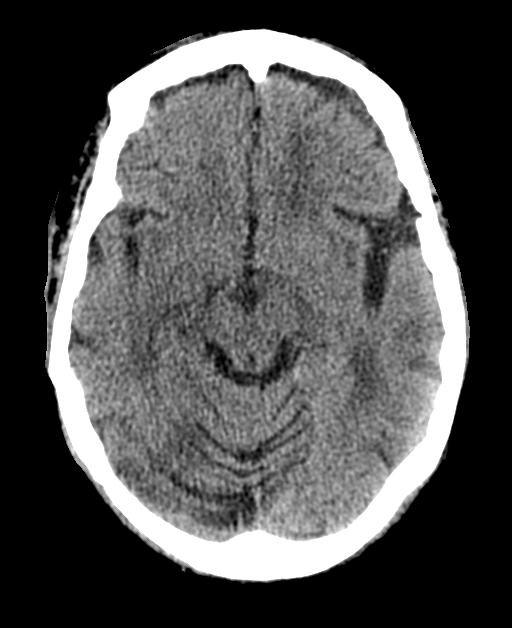
[im 19/34  brain]
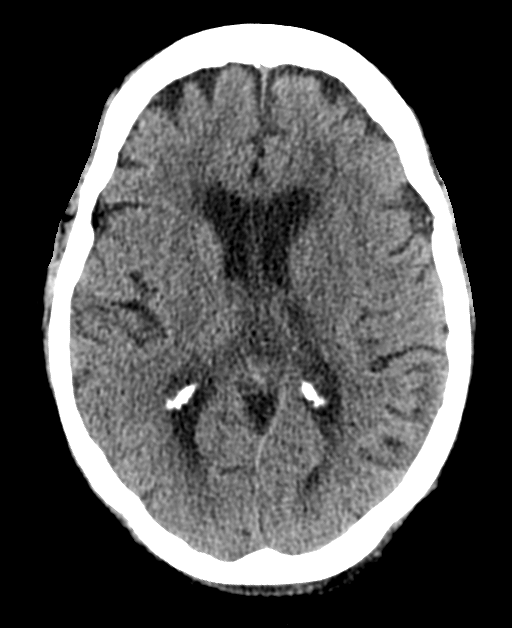
[im 22/34  brain]
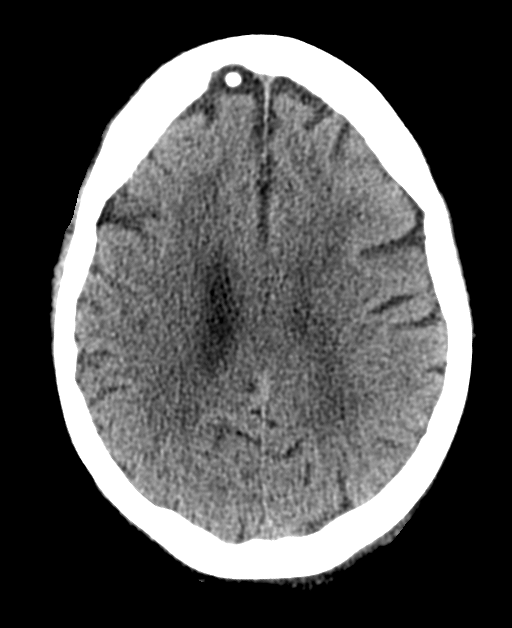
[im 24/34  brain]
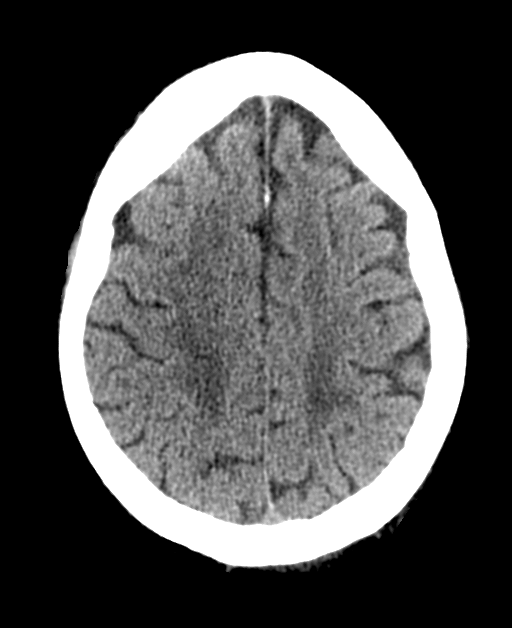
[im 24/34  bone]
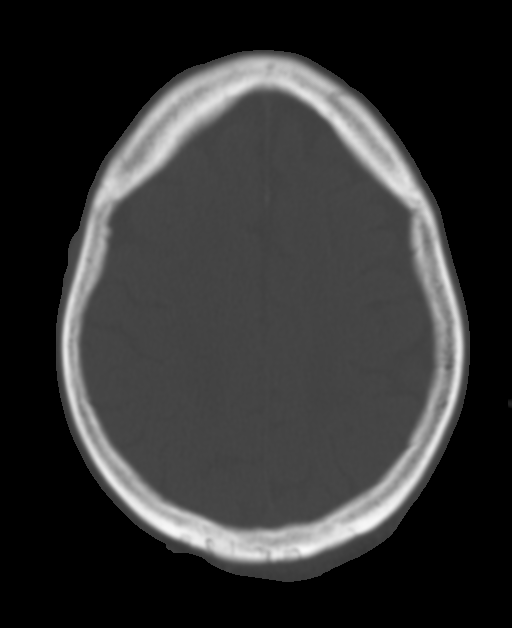
[im 26/34  brain]
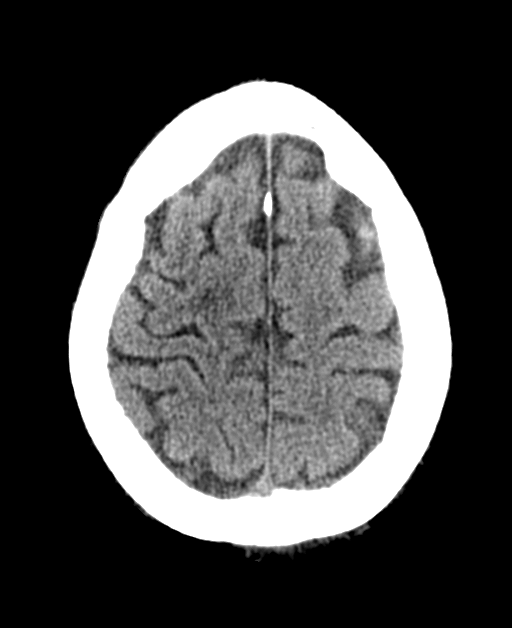
[im 29/34  brain]
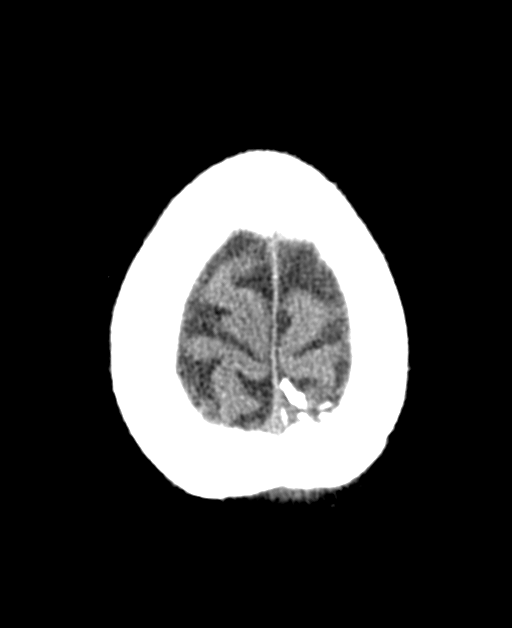
[im 31/34  brain]
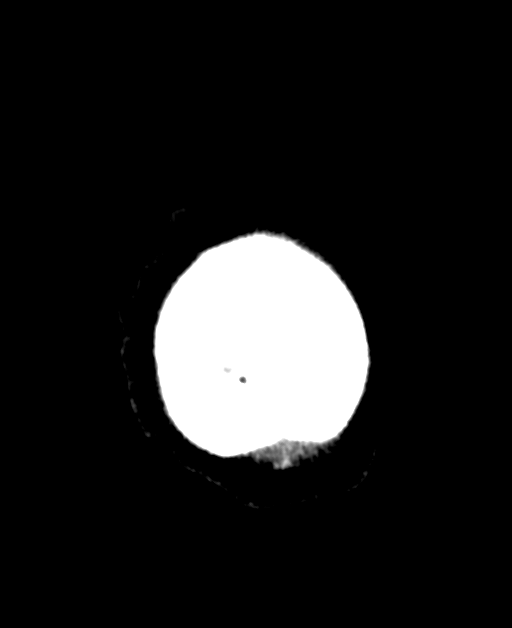

[12 of 30 positions shown; findings below may reference images not displayed]

FINDINGS: Brain: Old left cerebellar infarction is noted. No mass effect or
midline shift is noted. Ventricular size is within normal limits.
There is no evidence of mass lesion, hemorrhage or acute infarction.

Vascular: No hyperdense vessel or unexpected calcification.

Skull: Normal. Negative for fracture or focal lesion.

Sinuses/Orbits: No acute finding.

Other: None.
IMPRESSION: No acute intracranial abnormality seen.

## 2022-10-23 IMAGING — CT CT ABD-PELV W/O CM
2 of 5 series · 14 of 46 positions shown, 16 images · non-contrast
Comparison: 10/29/2020

CLINICAL DATA: Abdominal pain following a fall yesterday.

EXAM:
CT ABDOMEN AND PELVIS WITHOUT CONTRAST
TECHNIQUE: Multidetector CT imaging of the abdomen and pelvis was performed
following the standard protocol without IV contrast.

[Series 5: axial st · axial · 0.70mm/px · z∈[-690,-295]mm · 11 of 92 slices shown, 13 images]
[im 6/92  soft-tissue]
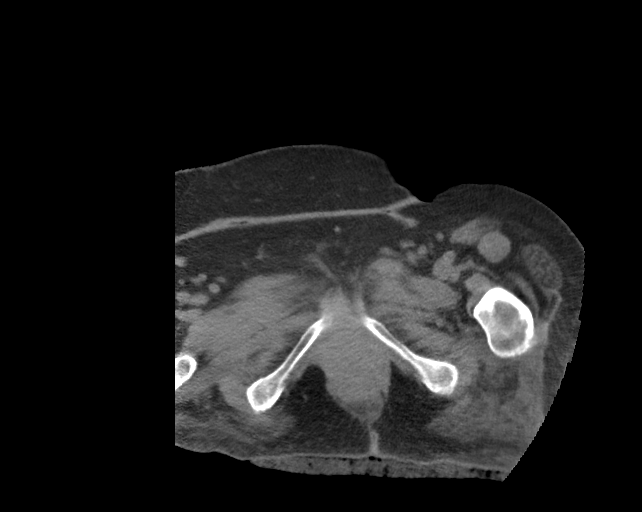
[im 6/92  bone]
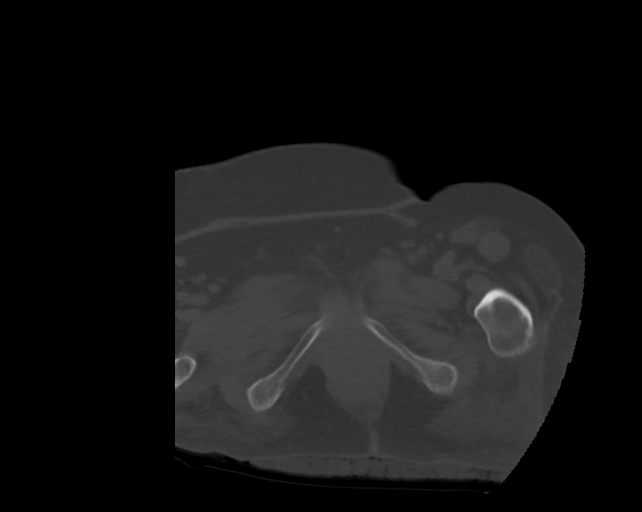
[im 18/92  soft-tissue]
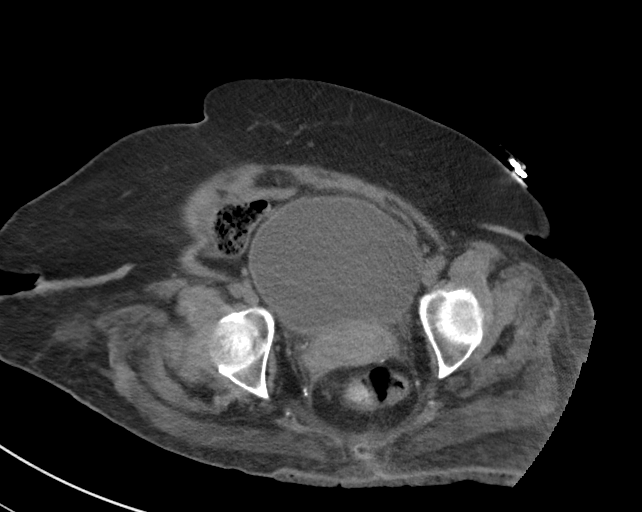
[im 23/92  soft-tissue]
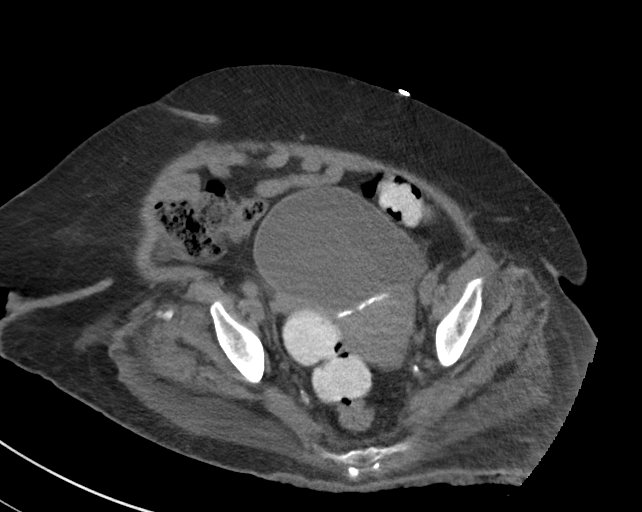
[im 29/92  soft-tissue]
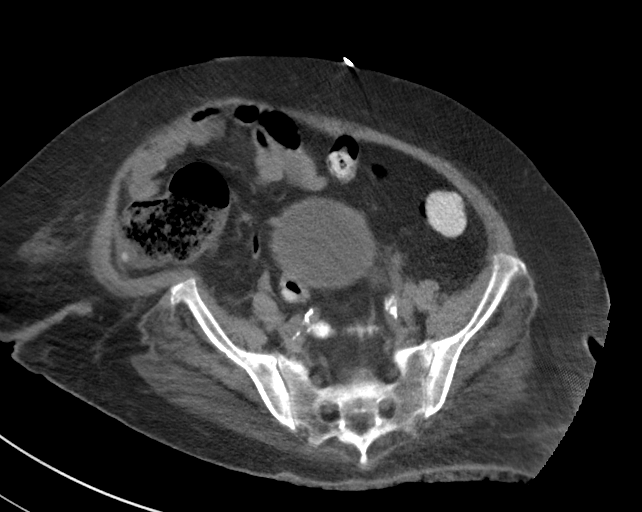
[im 40/92  soft-tissue]
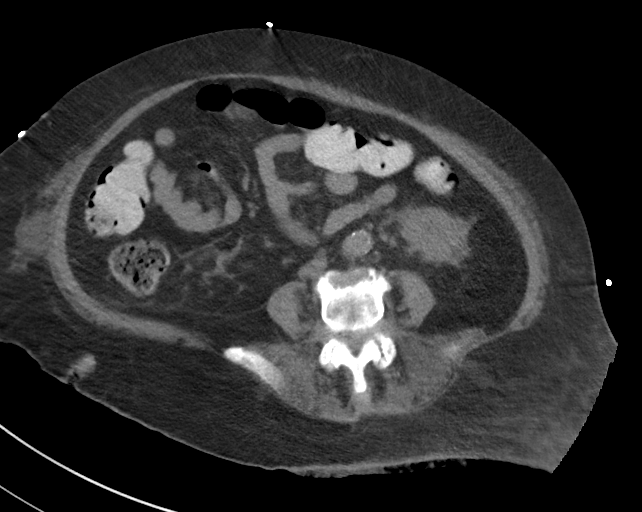
[im 46/92  soft-tissue]
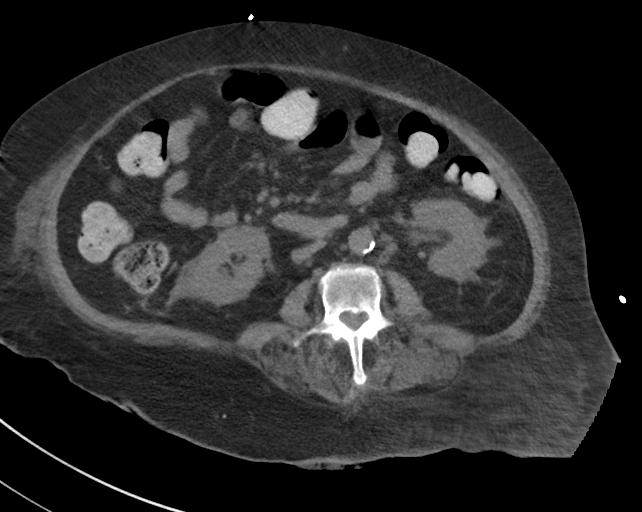
[im 52/92  soft-tissue]
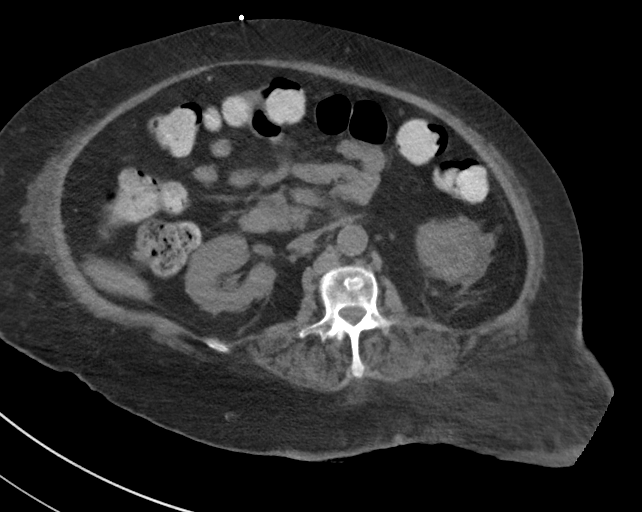
[im 63/92  soft-tissue]
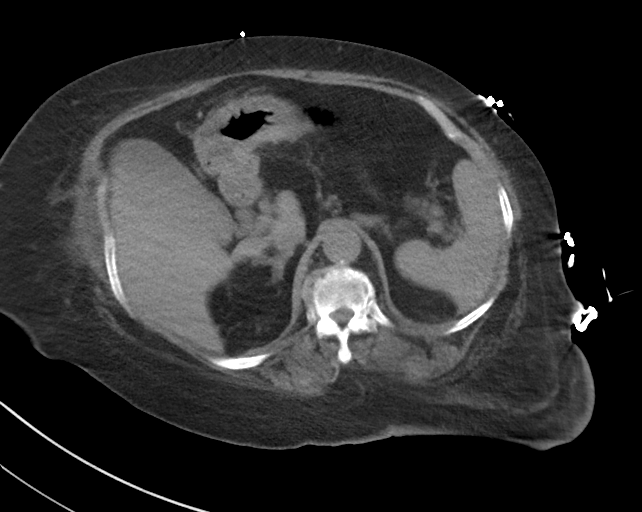
[im 69/92  soft-tissue]
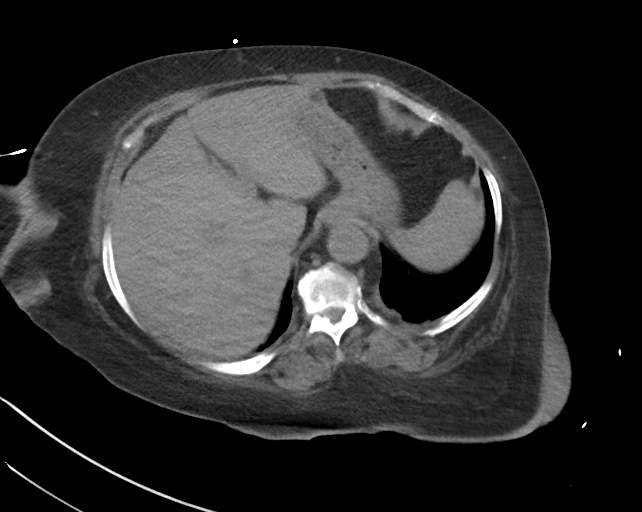
[im 69/92  bone]
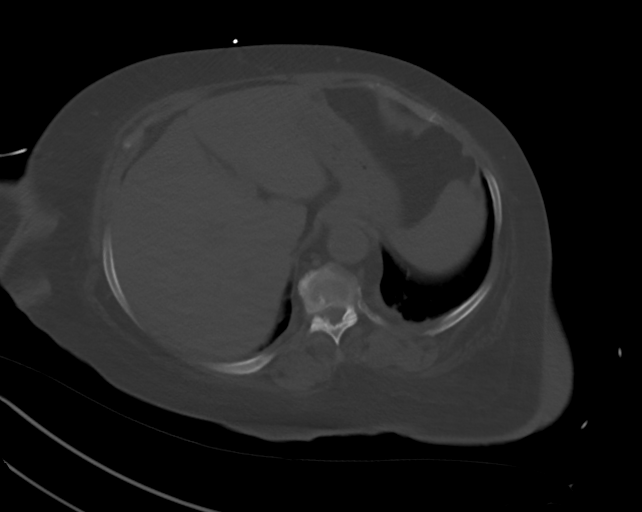
[im 74/92  soft-tissue]
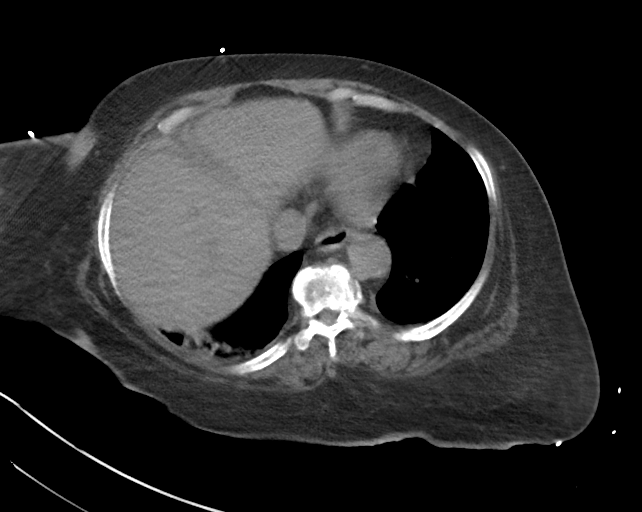
[im 86/92  soft-tissue]
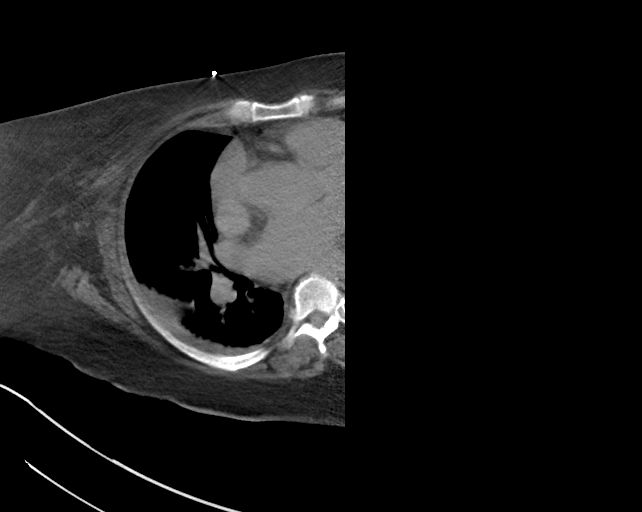

[Series 8: coronal st · coronal · 0.87mm/px · 3 of 116 slices shown]
[im 39/116  soft-tissue]
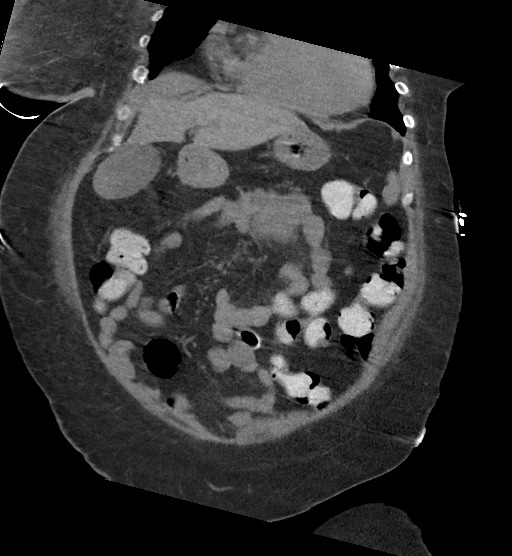
[im 52/116  soft-tissue]
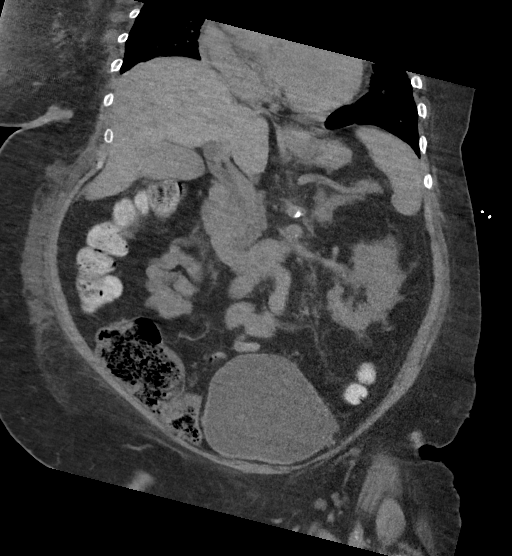
[im 64/116  soft-tissue]
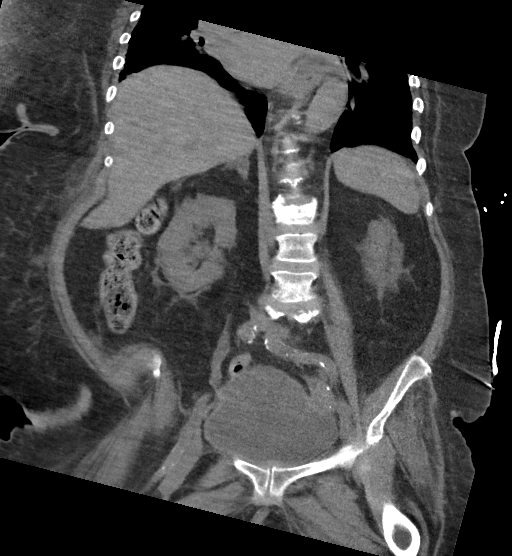

[14 of 46 positions shown; findings below may reference images not displayed]

FINDINGS: Lower chest: Stable enlarged heart. Resolved small bilateral pleural
effusions. Minimal bibasilar atelectasis with improvement.

Hepatobiliary: Dilated gallbladder containing sludge or noncalcified
gallstones. No gallbladder wall thickening or pericholecystic fluid.

Pancreas: Unremarkable. No pancreatic ductal dilatation or
surrounding inflammatory changes.

Spleen: Normal in size without focal abnormality.

Adrenals/Urinary Tract: Stable mild diffuse bilateral adrenal
enlargement compatible with mild hyperplasia. Unremarkable kidneys,
ureters and urinary bladder. Increased bilateral perinephric soft
tissue stranding and fluid.

Stomach/Bowel: Stomach is within normal limits. Appendix appears
normal. No evidence of bowel wall thickening, distention, or
inflammatory changes.

Vascular/Lymphatic: Atheromatous arterial calcifications without
aneurysm. No enlarged lymph nodes.

Reproductive: Uterus and bilateral adnexa are unremarkable.

Other: No abdominal wall hernia or abnormality. No abdominopelvic
ascites.

Musculoskeletal: Stable L1 vertebral superior endplate compression
deformity with no acute fracture lines and minimal bony
retropulsion. Stable grade 1 anterolisthesis at the L3-4 level.
Multilevel lumbar and lower thoracic spine degenerative changes.
IMPRESSION: 1. No acute abnormality.
2. Dilated gallbladder containing sludge or noncalcified gallstones.
3. Stable cardiomegaly.

## 2022-10-23 IMAGING — DX DG ANKLE COMPLETE 3+V*R*
3 series · 3 of 3 positions shown · non-contrast
Comparison: Right foot dated 08/02/2017

CLINICAL DATA: Right yesterday.  Ankle pain after falling

EXAM:
RIGHT ANKLE - COMPLETE 3+ VIEW

[ankle ap]
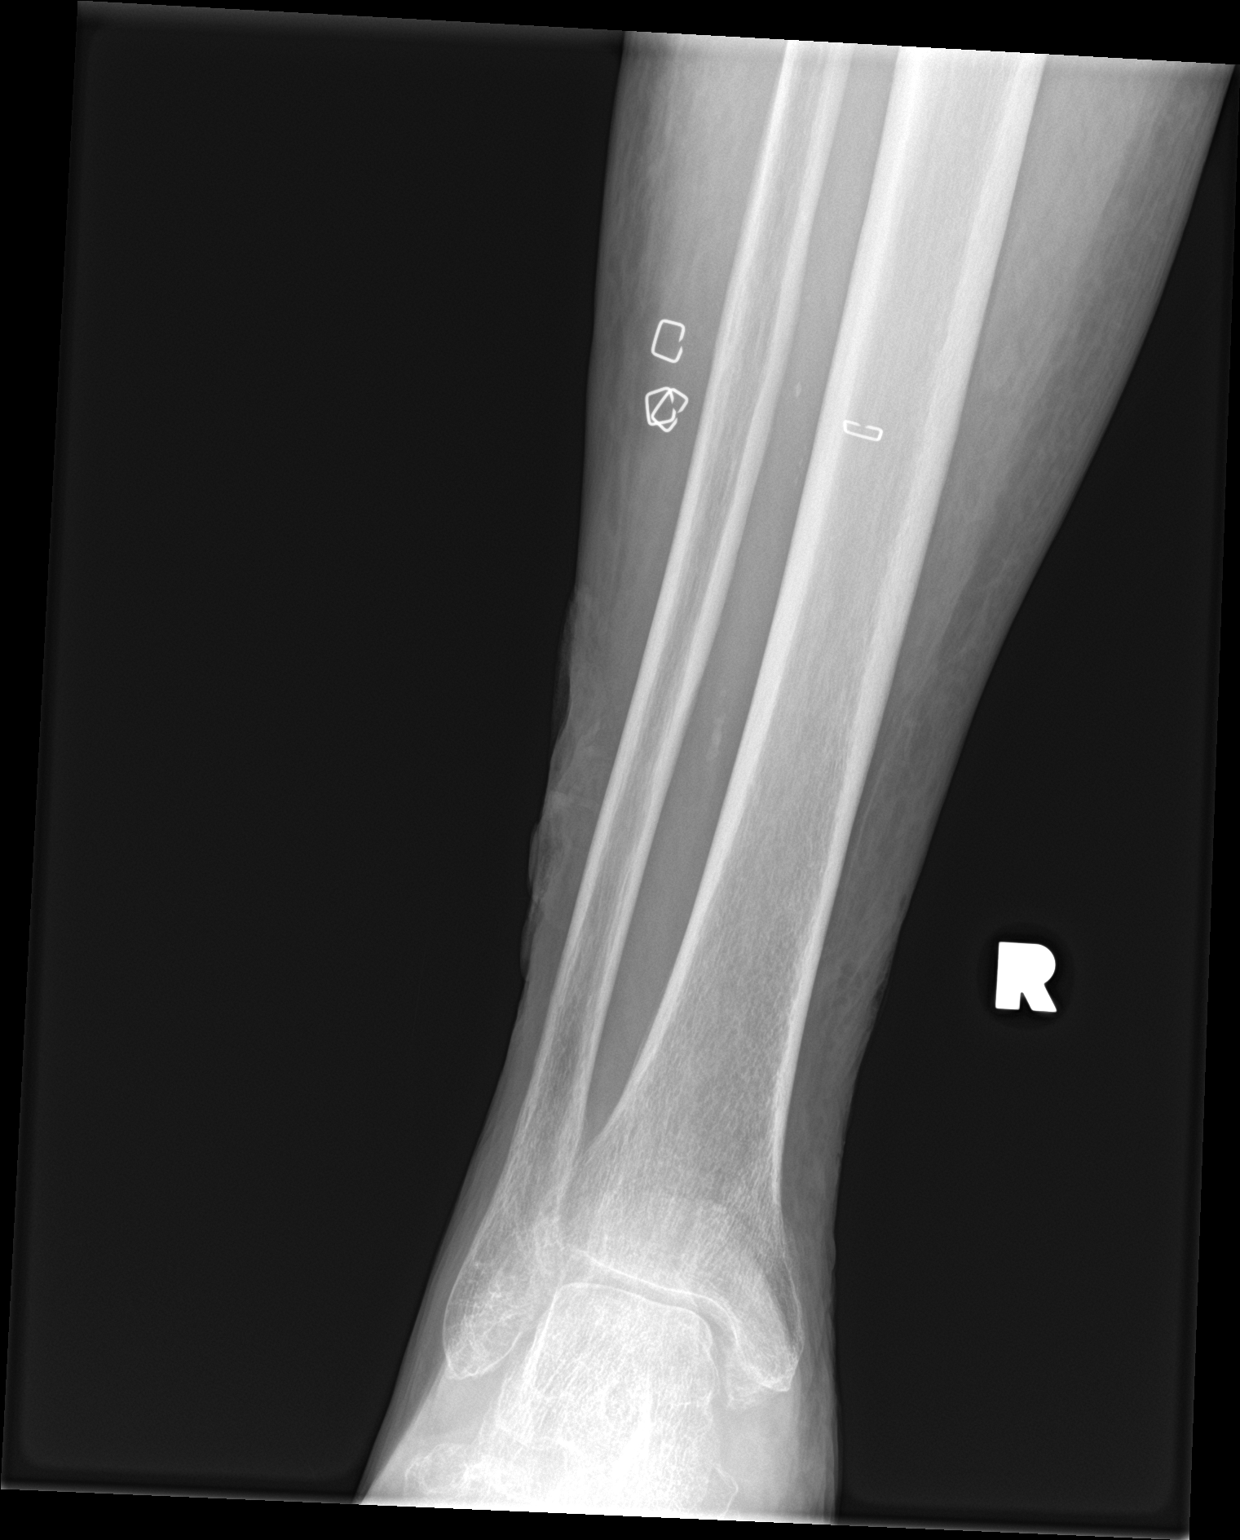

[ankle obl]
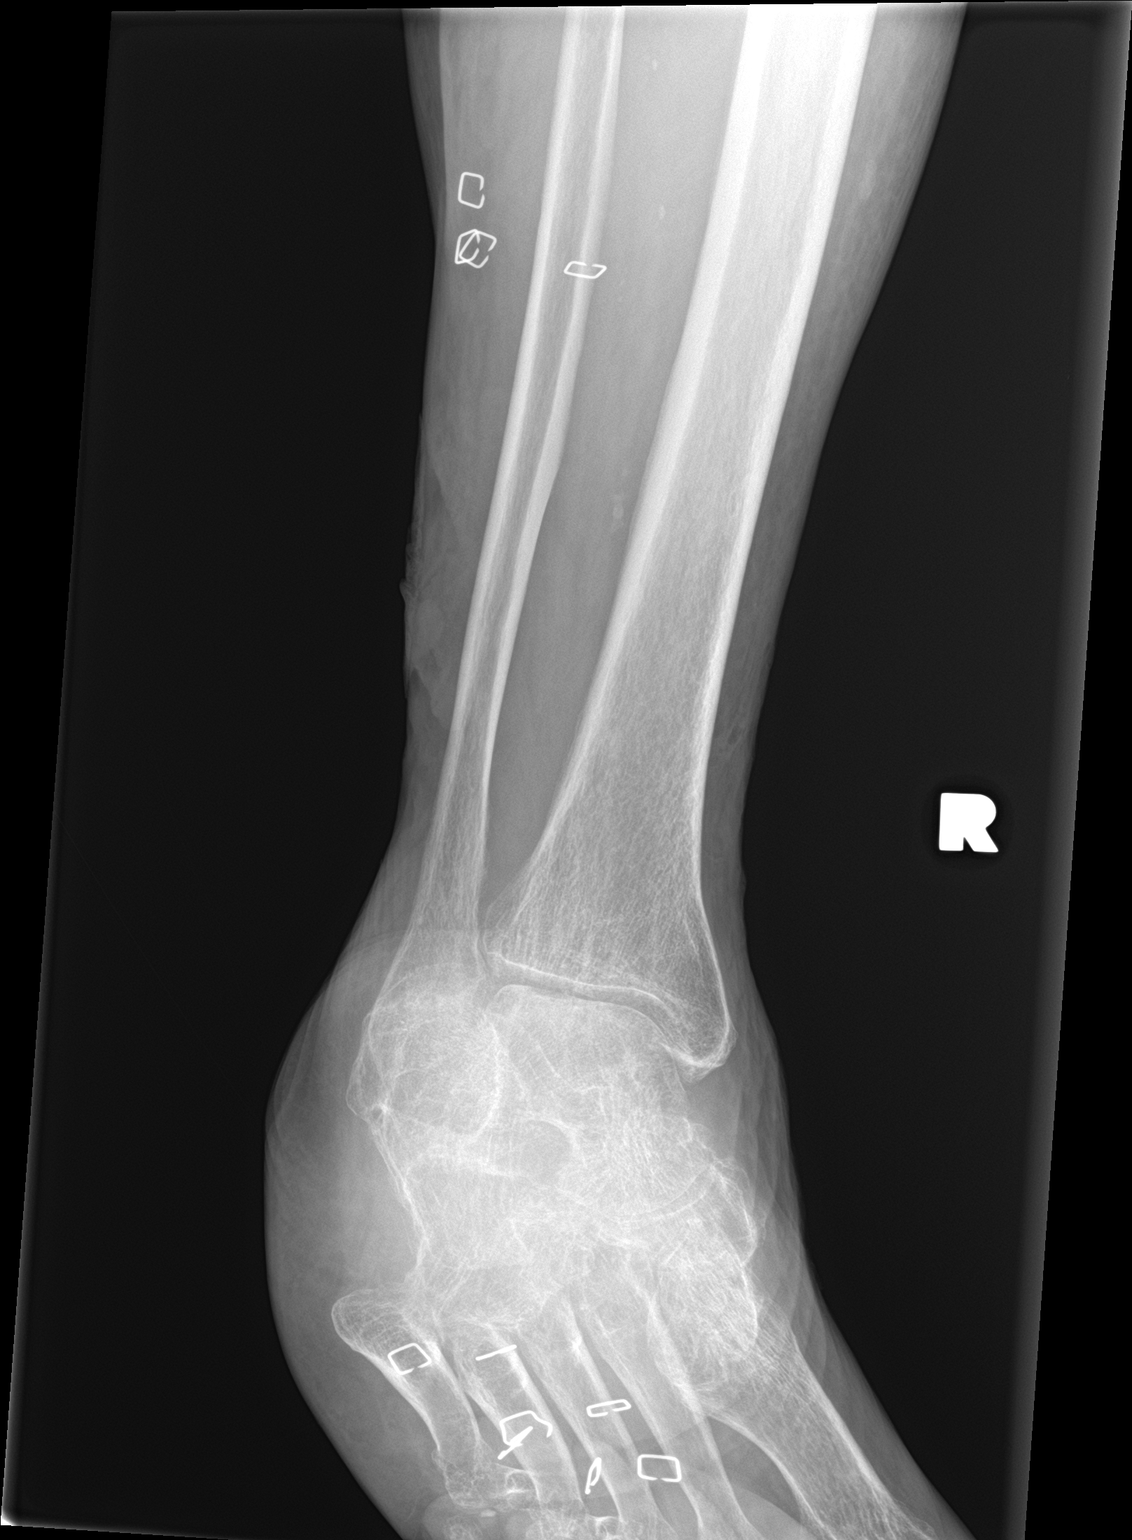

[ankle lat]
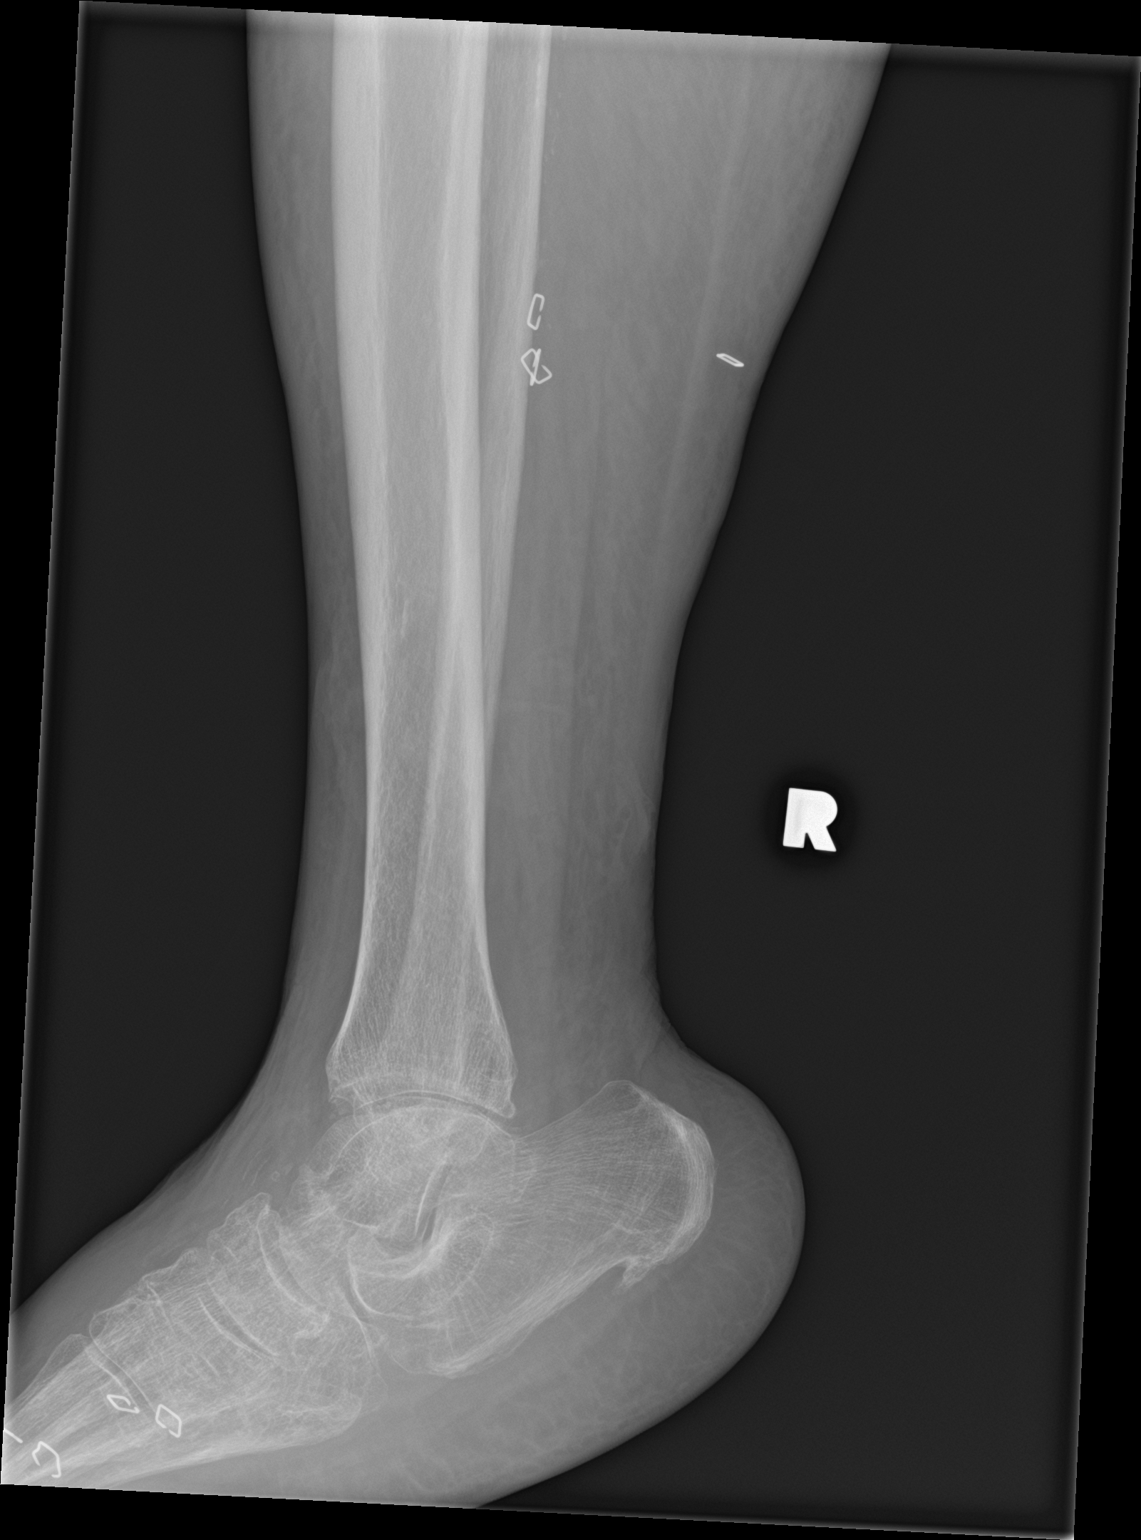

[3 of 3 positions shown; findings below may reference images not displayed]

FINDINGS: Soft tissue irregularity in the lateral aspect of the distal lower
leg. Mild talotibial degenerative changes. Marked pes planus.
Moderate-sized inferior calcaneal enthesophyte. Lateral subluxation
of the metatarsals relative to the tarsals. No fracture or
dislocation seen.
IMPRESSION: 1. No fracture or dislocation.
2. Charcot foot with marked pes planus.

## 2022-10-30 DIAGNOSIS — M7989 Other specified soft tissue disorders: Secondary | ICD-10-CM | POA: Diagnosis not present

## 2022-10-30 DIAGNOSIS — M79671 Pain in right foot: Secondary | ICD-10-CM | POA: Diagnosis not present

## 2022-11-02 DIAGNOSIS — I4891 Unspecified atrial fibrillation: Secondary | ICD-10-CM | POA: Diagnosis not present

## 2022-11-02 DIAGNOSIS — I5030 Unspecified diastolic (congestive) heart failure: Secondary | ICD-10-CM | POA: Diagnosis not present

## 2022-11-02 DIAGNOSIS — I739 Peripheral vascular disease, unspecified: Secondary | ICD-10-CM | POA: Diagnosis not present

## 2022-11-02 DIAGNOSIS — I10 Essential (primary) hypertension: Secondary | ICD-10-CM | POA: Diagnosis not present

## 2022-11-02 DIAGNOSIS — K219 Gastro-esophageal reflux disease without esophagitis: Secondary | ICD-10-CM | POA: Diagnosis not present

## 2022-11-02 DIAGNOSIS — I70209 Unspecified atherosclerosis of native arteries of extremities, unspecified extremity: Secondary | ICD-10-CM | POA: Diagnosis not present

## 2022-11-04 NOTE — Progress Notes (Addendum)
 Patient: [name] Date of Birth: [birth year]  Reason for Visit: Follow up History from: Patient Primary Neurologist: Willis/Camara   ASSESSMENT AND PLAN 57 y.o. year old female   1.  Symptomatic seizures in setting of diabetic ketoacidosis   2.  History of cocaine abuse and tobacco abuse   3.  Cerebrovascular disease, recent right brain stroke, left hemiparesis  -Continue Keppra 500 mg twice daily -She wishes to remain on Keppra, we have discussed weaning off since seizure event occurred during significant DKA event -Ensure A1c is being monitored, good control of vascular risk factors, with history of stroke ensure BP less than 130/90, LDL less than 70, A1c less than 7.0 -Follow-up in 1 year or sooner if needed  HISTORY OF PRESENT ILLNESS: Today 11/05/22 Here today alone. Living at [facility] nursing home. Trying to get in to an assisted living facility. Remains on Keppra, has decided to remain on it. No seizures. Not sure about A1C, gets insulin. Has palliative care consult every 6-8 weeks. In a wheelchair, does very little walking, only during therapy. Still feels left side is weak. Claims she just got her ability to manage her affairs back.   HISTORY  11/04/21 MM: [name] is a 57 year old female with a history of a seizure event in October 2021 after being diagnosed with DKA.  Since then the patient has not had any seizure events.  At the last visit an EEG was ordered but never completed.  The patient states that she does not wish to come off of Keppra.  She does not want to repeat an EEG.  The patient is currently in a skilled nursing facility.  She denies any seizure events.  She returns today for follow-up with her sister   10/01/2020 Dr. Anne Hahn: [name] is a 57 year old right-handed black female with a history of diabetes, tobacco abuse, cocaine abuse, chronic renal insufficiency, atrial fibrillation, congestive heart failure, and recent onset of seizure.  The  patient was admitted to the hospital on 08/13/2020, the patient claims that she was found at home having a seizure.  The patient was found to have diabetic ketoacidosis with a blood sugar of 772 on admission.  The patient admits to eating pure starch on a regular basis.  She was placed on Keppra in the hospital, at least 2 EEG studies were done, both showing generalized slowing consistent with a metabolic encephalopathy.  The patient underwent lumbar puncture showing 10 white cells with a normal glucose level and a slightly elevated protein level, no evidence of meningitis or herpes encephalitis was identified.  The patient has been noted to have a small subcortical stroke in the right basal ganglia area, she has had some left-sided weakness.  Since the hospitalization she has not been able to walk with some left-sided weakness.  The patient can take a few steps with a walker.  She wants to go home but she does not have any caretakers at home and she would require 24/7 assistance.  The patient initially had significant dysphagia but this is now improving.  The patient claims that her blood sugar remains in the 400 range but then admits to eating snacks that she gets from her roommate in the evening.  The patient does have urinary frequency and some urinary incontinence.  She has occasional headaches, she denies any vision changes.  She has not had any further seizures since coming out of the hospital.  She comes to this office for an evaluation.  REVIEW OF SYSTEMS: Out of a complete 14 system review of symptoms, the patient complains only of the following symptoms, and all other reviewed systems are negative.  See HPI  ALLERGIES: Allergies  Allergen Reactions   Sodium Hypochlorite Anaphylaxis    (Bleach wipes) "I am not able to breathe"    HOME MEDICATIONS: Outpatient Medications Prior to Visit  Medication Sig Dispense Refill   acetaminophen (TYLENOL) 325 MG tablet Take 2 tablets (650 mg total) by  mouth every 6 (six) hours as needed for headache, fever or mild pain. (Patient taking differently: Take 650 mg by mouth every 6 (six) hours as needed for fever (or pain).)     ALPRAZolam (XANAX) 0.5 MG tablet Take 0.5 mg by mouth 2 (two) times daily.     amLODipine (NORVASC) 5 MG tablet Take 5 mg by mouth daily.     ammonium lactate (AMLACTIN) 12 % lotion Apply 1 application. topically as needed for dry skin. (Patient taking differently: Apply 1 application  topically as needed for dry skin (to toenails).) 400 g 4   ascorbic acid (VITAMIN C) 500 MG tablet Take 500 mg by mouth 2 (two) times daily.     aspirin EC 81 MG EC tablet Take 1 tablet (81 mg total) by mouth daily. Swallow whole. 30 tablet 11   benztropine (COGENTIN) 0.5 MG tablet Take 1 tablet (0.5 mg total) by mouth 2 (two) times daily. 60 tablet 0   cetirizine (ZYRTEC ALLERGY) 10 MG tablet Take 1 tablet (10 mg total) by mouth daily. 30 tablet 0   Dextromethorphan-guaiFENesin (GERI-TUSSIN DM) 10-100 MG/5ML liquid Take 10 mLs by mouth every 12 (twelve) hours.     ELIQUIS 5 MG TABS tablet Take 5 mg by mouth 2 (two) times daily.     feeding supplement (ENSURE ENLIVE / ENSURE PLUS) LIQD Take 237 mLs by mouth 3 (three) times daily between meals. 237 mL 12   FLUoxetine (PROZAC) 40 MG capsule Take 2 capsules by mouth daily.     HUMALOG KWIKPEN 100 UNIT/ML KwikPen Inject 1-10 Units into the skin See admin instructions. Inject 1-10 units into the skin before meals and at bedtime, PER SLIDING SCALE: BGL 151-200 = 1 unit; 201-250 = 2 units; 251-300 = 4 units; 301-350 = 6 units; 351-400 = 8 units; 401-450 = 10 units     levETIRAcetam (KEPPRA) 500 MG tablet Take 1 tablet (500 mg total) by mouth 2 (two) times daily.     loperamide (IMODIUM A-D) 2 MG tablet Take 4 mg by mouth 4 (four) times daily as needed for diarrhea or loose stools.     metoprolol succinate (TOPROL-XL) 25 MG 24 hr tablet Take 50 mg by mouth daily.     mirtazapine (REMERON) 7.5 MG tablet  Take 7.5 mg by mouth at bedtime.     Multiple Vitamin (MULTIVITAMIN WITH MINERALS) TABS tablet Take 1 tablet by mouth daily.     olopatadine (PATADAY) 0.1 % ophthalmic solution Place 1 drop into both eyes 2 (two) times daily.     omeprazole (PRILOSEC) 20 MG capsule Take 20 mg by mouth daily before breakfast.     oxybutynin (DITROPAN-XL) 5 MG 24 hr tablet Take 5 mg by mouth in the morning.     rosuvastatin (CRESTOR) 10 MG tablet Take 10 mg by mouth every evening.     sennosides-docusate sodium (SENOKOT-S) 8.6-50 MG tablet Take 1 tablet by mouth in the morning and at bedtime.     thiamine 100 MG tablet Take 1  tablet (100 mg total) by mouth daily.     tiotropium (SPIRIVA HANDIHALER) 18 MCG inhalation capsule Place 18 mcg into inhaler and inhale daily as needed (Shortness of breath).     traMADol (ULTRAM) 50 MG tablet Take 0.5 tablets by mouth every 6 (six) hours as needed for moderate pain or severe pain.     zinc sulfate 220 (50 Zn) MG capsule Take 1 capsule (220 mg total) by mouth daily. 30 capsule 2   No facility-administered medications prior to visit.    PAST MEDICAL HISTORY: Past Medical History:  Diagnosis Date   Acute lower UTI 06/04/2018   Acute metabolic encephalopathy 03/11/2021   Acute on chronic respiratory failure with hypoxia (HCC) 07/20/2021   Acute respiratory failure requiring reintubation (HCC) 11/09/2012   Acute respiratory failure with hypoxia (HCC) 12/03/2020   AKI (acute kidney injury) (HCC) 06/04/2018   Alcohol use    Ankle fracture, lateral malleolus, closed 2013   Anxiety    Aortic atherosclerosis (HCC) 03/10/2021   Atherosclerosis of native artery of both legs with gangrene (HCC)    Breast mass, left 2013   CHF (congestive heart failure) (HCC)    a. EF 20-25% by echo in 05/2016 with cath showing normal cors b. EF 50-55% in 07/2020 c. 01/2021: EF at 55-60% with moderate LVH   Chronic anemia    CKD (chronic kidney disease), stage III (HCC)    Cocaine abuse  (HCC)    COPD (chronic obstructive pulmonary disease) (HCC)    Diabetes mellitus, type 2 (HCC)    Diabetic Charcot foot (HCC)    DKA (diabetic ketoacidosis) (HCC) 10/29/2020   Essential hypertension    H/O open hand wound 11/22/2018   History of cardiomyopathy    History of GI bleed 2011   Hyperlipidemia    Lactic acid acidosis 09/11/2014   Metabolic encephalopathy 03/11/2021   Noncompliance    Obesity    Oral candidiasis 03/10/2021   PAF (paroxysmal atrial fibrillation) (HCC)    Panic attacks    PAT (paroxysmal atrial tachycardia)    Previously on Amiodarone   Pneumonia due to COVID-19 virus 12/03/2020   Seizures (HCC) 10/01/2020   Sepsis due to skin infection (HCC) 04/01/2021   Shock (HCC) 10/29/2020   Sleep apnea    Not on CPAP   Stroke (HCC) 2018   Thrombocytosis 08/02/2017   Tobacco abuse    UGI bleed    Urinary incontinence     PAST SURGICAL HISTORY: Past Surgical History:  Procedure Laterality Date   ANGIOPLASTY ILLIAC ARTERY Right 04/23/2021   Procedure: ANGIOPLASTY AND STENT SUPERFICIAL FEMORAL ARTERY;  Surgeon: Leonie Douglas, MD;  Location[facility];  Service: Vascular;  Laterality: Right;   AORTOGRAM  04/23/2021   Procedure: AORTOGRAM;  Surgeon: Leonie Douglas, MD;  Location: [facility] OR;  Service: Vascular;;   AORTOGRAM Left 04/30/2021   Procedure: left lower extremity angiogram with second order cannulation;  Surgeon: Leonie Douglas, MD;  Location: [facility] OR;  Service: Vascular;  Laterality: Left;   BIOPSY  11/12/2020   Procedure: BIOPSY;  Surgeon: Dolores Frame, MD;  Location: [facility] SUITE;  Service: Gastroenterology;;   BREAST BIOPSY     CARDIAC CATHETERIZATION N/A 07/28/2016   Procedure: Left Heart Cath and Coronary Angiography;  Surgeon: Corky Crafts, MD;  Location: [facility] INVASIVE CV LAB;  Service: Cardiovascular;  Laterality: N/A;   COLONOSCOPY N/A 05/10/2019   Procedure: COLONOSCOPY;  Surgeon: West Bali, MD;  Location: [facility];  Service: Endoscopy;  Laterality: N/A;  Phenergan 12.5 mg IV in pre-op   DILATION AND CURETTAGE OF UTERUS     ESOPHAGOGASTRODUODENOSCOPY (EGD) WITH PROPOFOL N/A 11/12/2020   Procedure: ESOPHAGOGASTRODUODENOSCOPY (EGD) WITH PROPOFOL;  Surgeon: Dolores Frame, MD;  Location: [facility];  Service: Gastroenterology;  Laterality: N/A;   I & D EXTREMITY Bilateral 09/22/2017   Procedure: BILATERAL DEBRIDEMENT LEG/FOOT ULCERS, APPLY VERAFLO WOUND VAC;  Surgeon: Nadara Mustard, MD;  Location: [facility] OR;  Service: Orthopedics;  Laterality: Bilateral;   I & D EXTREMITY Right 10/11/2018   Procedure: IRRIGATION AND DEBRIDEMENT RIGHT HAND;  Surgeon: Dominica Severin, MD;  Location: [facility] OR;  Service: Orthopedics;  Laterality: Right;   I & D EXTREMITY Right 10/13/2018   Procedure: REPEAT IRRIGATION AND DEBRIDEMENT RIGHT HAND;  Surgeon: Dominica Severin, MD;  Location: [facility] OR;  Service: Orthopedics;  Laterality: Right;   I & D EXTREMITY Right 11/22/2018   Procedure: IRRIGATION AND DEBRIDEMENT AND PINNING RIGHT HAND;  Surgeon: Dominica Severin, MD;  Location: [facility] OR;  Service: Orthopedics;  Laterality: Right;   IR RADIOLOGIST EVAL & MGMT  07/05/2018   LOWER EXTREMITY ANGIOGRAM Bilateral 04/23/2021   Procedure: RIGHT  AND LEFT LOWER EXTREMITY ANGIOGRAM;  Surgeon: Leonie Douglas, MD;  Location: [facility] OR;  Service: Vascular;  Laterality: Bilateral;   POLYPECTOMY  05/10/2019   Procedure: POLYPECTOMY;  Surgeon: West Bali, MD;  Location: [facility];  Service: Endoscopy;;   SKIN SPLIT GRAFT Bilateral 09/28/2017   Procedure: BILATERAL SPLIT THICKNESS SKIN GRAFT LEGS/FEET AND APPLY VAC;  Surgeon: Nadara Mustard, MD;  Location: [facility] OR;  Service: Orthopedics;  Laterality: Bilateral;   SKIN SPLIT GRAFT Right 11/22/2018   Procedure: SKIN GRAFT SPLIT THICKNESS;  Surgeon: Dominica Severin, MD;  Location: [facility] OR;  Service: Orthopedics;  Laterality: Right;    FAMILY HISTORY: Family History  Problem Relation Age of Onset    Hypertension Mother    Heart attack Mother    Hypertension Father        CABG    Hypertension Sister    Hypertension Brother    Hypertension Sister    Cancer Sister        breast    Arthritis Other    Cancer Other    Diabetes Other    Asthma Other    Hypertension Daughter    Hypertension Son     SOCIAL HISTORY: Social History   Socioeconomic History   Marital status: Widowed    Spouse name: Not on file   Number of children: 2   Years of education: 9   Highest education level: 9th grade  Occupational History   Occupation: Disabled  Tobacco Use   Smoking status: Former    Packs/day: 0.25    Years: 20.00    Total pack years: 5.00    Types: Cigarettes    Quit date: 06/03/2017    Years since quitting: 5.4    Passive exposure: Never   Smokeless tobacco: Never  Vaping Use   Vaping Use: Never used  Substance and Sexual Activity   Alcohol use: Not Currently    Alcohol/week: 0.0 standard drinks of alcohol    Comment: Quit in 2017   Drug use: Not Currently    Frequency: 3.0 times per week    Types: Marijuana, Cocaine    Comment: none since August 2018   Sexual activity: Not on file  Other Topics Concern   Not on file  Social History Narrative   Right  handed   Drinks 1-2 cups caffeine daily   Social Determinants of Health   Financial Resource Strain: Medium Risk (10/15/2020)   Overall Financial Resource Strain (CARDIA)    Difficulty of Paying Living Expenses: Somewhat hard  Food Insecurity: Food Insecurity Present (10/15/2020)   Hunger Vital Sign    Worried About Running Out of Food in the Last Year: Sometimes true    Ran Out of Food in the Last Year: Sometimes true  Transportation Needs: No Transportation Needs (10/15/2020)   PRAPARE - Administrator, Civil Service (Medical): No    Lack of Transportation (Non-Medical): No  Physical Activity: Insufficiently Active (10/15/2020)   Exercise Vital Sign    Days of Exercise per Week: 7 days    Minutes of  Exercise per Session: 20 min  Stress: Stress Concern Present (10/15/2020)   [employer] of Occupational Health - Occupational Stress Questionnaire    Feeling of Stress : To some extent  Social Connections: Moderately Isolated (10/15/2020)   Social Connection and Isolation Panel [NHANES]    Frequency of Communication with Friends and Family: More than three times a week    Frequency of Social Gatherings with Friends and Family: More than three times a week    Attends Religious Services: More than 4 times per year    Active Member of [employer] or Organizations: No    Attends Banker Meetings: Never    Marital Status: Widowed  Intimate Partner Violence: Not At Risk (10/15/2020)   Humiliation, Afraid, Rape, and Kick questionnaire    Fear of Current or Ex-Partner: No    Emotionally Abused: No    Physically Abused: No    Sexually Abused: No   PHYSICAL EXAM  Vitals:   11/05/22 1055  BP: (!) 138/98  Pulse: (!) 57  Weight: 141 lb (64 kg)  Height: 5\' 1"  (1.549 m)   Body mass index is 26.64 kg/m.  Generalized: Well developed, in no acute distress  Neurological examination  Mentation: Alert oriented to time, place, history taking. Follows all commands speech and language fluent Cranial nerve II-XII: Pupils were equal round reactive to light. Extraocular movements were full, visual field were full on confrontational test. Facial sensation and strength were normal.  Head turning and shoulder shrug  were normal and symmetric. Motor: Overall good strength, no significant muscle weakness was noted with the exception of the left upper arm due to reported recent vaccination Sensory: Sensory testing is intact to soft touch on all 4 extremities. No evidence of extinction is noted.  Coordination: Cerebellar testing reveals good finger-nose-finger and heel-to-shin bilaterally.  Gait and station: In a wheelchair, is nonambulatory Reflexes: Deep tendon reflexes are symmetric and normal  bilaterally.   DIAGNOSTIC DATA (LABS, IMAGING, TESTING) - I reviewed patient records, labs, notes, testing and imaging myself where available.  Lab Results  Component Value Date   WBC 8.2 09/07/2022   HGB 13.3 09/07/2022   HCT 39.8 09/07/2022   MCV 93.9 09/07/2022   PLT 285 09/07/2022      Component Value Date/Time   NA 141 09/07/2022 2235   K 4.3 09/07/2022 2235   CL 109 09/07/2022 2235   CO2 19 (L) 09/07/2022 2235   GLUCOSE 154 (H) 09/07/2022 2235   BUN 43 (H) 09/07/2022 2235   CREATININE 2.34 (H) 09/07/2022 2235   CREATININE 1.56 (H) 07/26/2018 1513   CALCIUM 9.3 09/07/2022 2235   CALCIUM 8.9 09/13/2014 0703   PROT 6.9 09/07/2022 2235   ALBUMIN  2.8 (L) 09/07/2022 2235   AST 30 09/07/2022 2235   ALT 27 09/07/2022 2235   ALKPHOS 110 09/07/2022 2235   BILITOT 0.5 09/07/2022 2235   GFRNONAA 24 (L) 09/07/2022 2235   GFRNONAA 37 (L) 07/26/2018 1513   GFRAA 54 (L) 05/09/2019 0956   GFRAA 43 (L) 07/26/2018 1513   Lab Results  Component Value Date   CHOL 240 (H) 08/23/2020   HDL 34 (L) 08/23/2020   LDLCALC 180 (H) 08/23/2020   LDLDIRECT 122 (H) 01/08/2010   TRIG 261 (H) 12/03/2020   CHOLHDL 7.1 08/23/2020   Lab Results  Component Value Date   HGBA1C 6.1 (H) 07/01/2021   Lab Results  Component Value Date   VITAMINB12 696 05/06/2021   Lab Results  Component Value Date   TSH 0.991 03/22/2021    Margie Ege, AGNP-C, DNP 11/05/2022, 11:17 AM [facility] [address] [place], [place] [address] [phone]

## 2022-11-05 ENCOUNTER — Encounter: Payer: Self-pay | Admitting: Neurology

## 2022-11-05 ENCOUNTER — Ambulatory Visit (INDEPENDENT_AMBULATORY_CARE_PROVIDER_SITE_OTHER): Payer: 59 | Admitting: Neurology

## 2022-11-05 VITALS — BP 138/98 | HR 57 | Ht 61.0 in | Wt 141.0 lb

## 2022-11-05 DIAGNOSIS — K219 Gastro-esophageal reflux disease without esophagitis: Secondary | ICD-10-CM | POA: Diagnosis not present

## 2022-11-05 DIAGNOSIS — I739 Peripheral vascular disease, unspecified: Secondary | ICD-10-CM | POA: Diagnosis not present

## 2022-11-05 DIAGNOSIS — R5381 Other malaise: Secondary | ICD-10-CM | POA: Diagnosis not present

## 2022-11-05 DIAGNOSIS — I5030 Unspecified diastolic (congestive) heart failure: Secondary | ICD-10-CM | POA: Diagnosis not present

## 2022-11-05 DIAGNOSIS — G40909 Epilepsy, unspecified, not intractable, without status epilepticus: Secondary | ICD-10-CM | POA: Diagnosis not present

## 2022-11-05 DIAGNOSIS — J449 Chronic obstructive pulmonary disease, unspecified: Secondary | ICD-10-CM | POA: Diagnosis not present

## 2022-11-05 DIAGNOSIS — I4891 Unspecified atrial fibrillation: Secondary | ICD-10-CM | POA: Diagnosis not present

## 2022-11-05 DIAGNOSIS — I70209 Unspecified atherosclerosis of native arteries of extremities, unspecified extremity: Secondary | ICD-10-CM | POA: Diagnosis not present

## 2022-11-05 NOTE — Patient Instructions (Signed)
Continue Keppra 500 mg twice daily for seizure prevention. Make sure A1C is being checked and managed See you back in 1 year

## 2022-11-06 DIAGNOSIS — E559 Vitamin D deficiency, unspecified: Secondary | ICD-10-CM | POA: Diagnosis not present

## 2022-11-06 DIAGNOSIS — Z79899 Other long term (current) drug therapy: Secondary | ICD-10-CM | POA: Diagnosis not present

## 2022-11-06 DIAGNOSIS — E119 Type 2 diabetes mellitus without complications: Secondary | ICD-10-CM | POA: Diagnosis not present

## 2022-11-11 DIAGNOSIS — I10 Essential (primary) hypertension: Secondary | ICD-10-CM | POA: Diagnosis not present

## 2022-11-12 DIAGNOSIS — J449 Chronic obstructive pulmonary disease, unspecified: Secondary | ICD-10-CM | POA: Diagnosis not present

## 2022-11-12 DIAGNOSIS — I5030 Unspecified diastolic (congestive) heart failure: Secondary | ICD-10-CM | POA: Diagnosis not present

## 2022-11-12 DIAGNOSIS — E785 Hyperlipidemia, unspecified: Secondary | ICD-10-CM | POA: Diagnosis not present

## 2022-11-12 DIAGNOSIS — I10 Essential (primary) hypertension: Secondary | ICD-10-CM | POA: Diagnosis not present

## 2022-11-12 DIAGNOSIS — I4891 Unspecified atrial fibrillation: Secondary | ICD-10-CM | POA: Diagnosis not present

## 2022-11-12 DIAGNOSIS — D638 Anemia in other chronic diseases classified elsewhere: Secondary | ICD-10-CM | POA: Diagnosis not present

## 2022-11-12 DIAGNOSIS — E1151 Type 2 diabetes mellitus with diabetic peripheral angiopathy without gangrene: Secondary | ICD-10-CM | POA: Diagnosis not present

## 2022-11-12 IMAGING — DX DG CHEST 1V PORT
1 series · 1 of 1 positions shown · non-contrast
Comparison: March 09, 2021

CLINICAL DATA: Tachycardia and tachypnea

EXAM:
PORTABLE CHEST 1 VIEW

[chest ap]
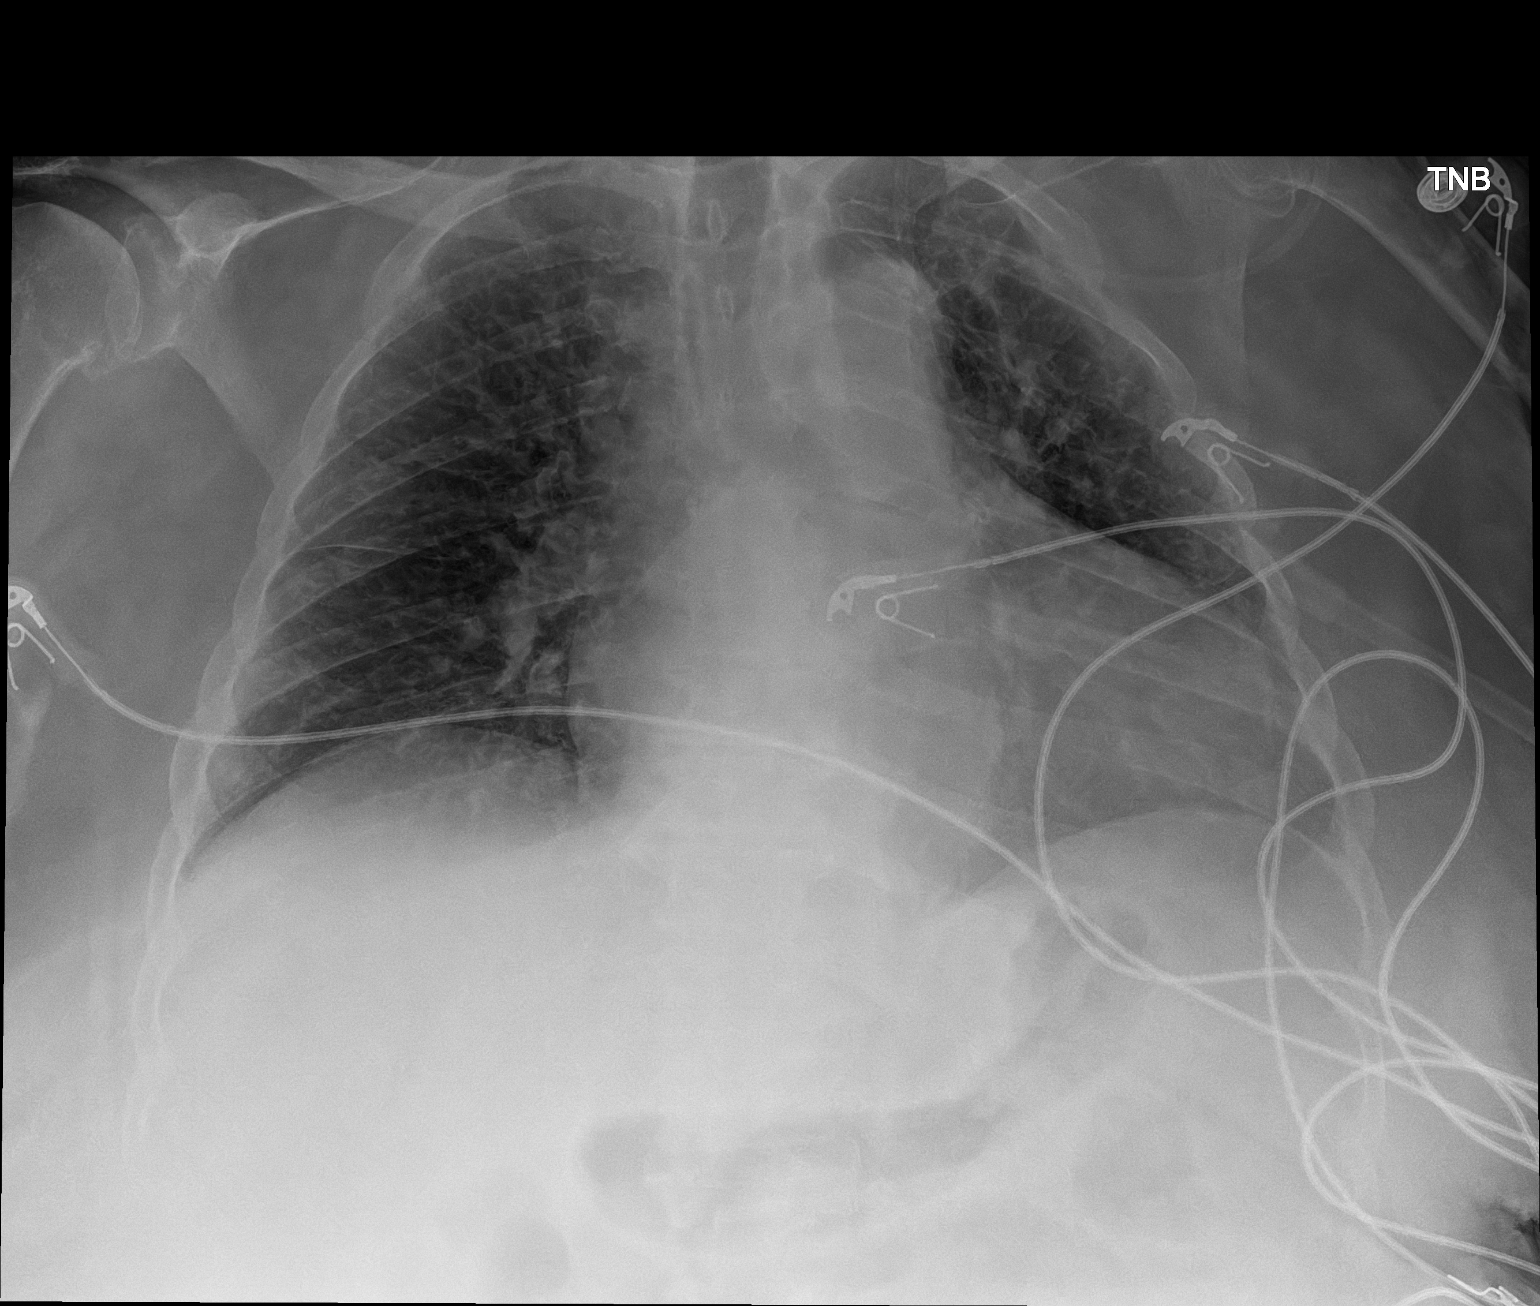

[1 of 1 positions shown; findings below may reference images not displayed]

FINDINGS: No edema or airspace opacity. Cardiomegaly again noted with
pulmonary vascularity within normal limits. No adenopathy. No bone
lesions.
IMPRESSION: Cardiomegaly again noted.  No edema or airspace opacity.

## 2022-11-13 DIAGNOSIS — I10 Essential (primary) hypertension: Secondary | ICD-10-CM | POA: Diagnosis not present

## 2022-11-14 DIAGNOSIS — J449 Chronic obstructive pulmonary disease, unspecified: Secondary | ICD-10-CM | POA: Diagnosis not present

## 2022-11-14 DIAGNOSIS — E119 Type 2 diabetes mellitus without complications: Secondary | ICD-10-CM | POA: Diagnosis not present

## 2022-11-27 DIAGNOSIS — E119 Type 2 diabetes mellitus without complications: Secondary | ICD-10-CM | POA: Diagnosis not present

## 2022-11-30 DIAGNOSIS — D649 Anemia, unspecified: Secondary | ICD-10-CM | POA: Diagnosis not present

## 2022-12-01 DIAGNOSIS — H524 Presbyopia: Secondary | ICD-10-CM | POA: Diagnosis not present

## 2022-12-01 DIAGNOSIS — E119 Type 2 diabetes mellitus without complications: Secondary | ICD-10-CM | POA: Diagnosis not present

## 2022-12-01 DIAGNOSIS — Z961 Presence of intraocular lens: Secondary | ICD-10-CM | POA: Diagnosis not present

## 2022-12-02 DIAGNOSIS — I10 Essential (primary) hypertension: Secondary | ICD-10-CM | POA: Diagnosis not present

## 2022-12-02 DIAGNOSIS — D649 Anemia, unspecified: Secondary | ICD-10-CM | POA: Diagnosis not present

## 2022-12-04 DIAGNOSIS — K219 Gastro-esophageal reflux disease without esophagitis: Secondary | ICD-10-CM | POA: Diagnosis not present

## 2022-12-04 DIAGNOSIS — I4891 Unspecified atrial fibrillation: Secondary | ICD-10-CM | POA: Diagnosis not present

## 2022-12-04 DIAGNOSIS — I739 Peripheral vascular disease, unspecified: Secondary | ICD-10-CM | POA: Diagnosis not present

## 2022-12-04 DIAGNOSIS — I10 Essential (primary) hypertension: Secondary | ICD-10-CM | POA: Diagnosis not present

## 2022-12-04 DIAGNOSIS — J449 Chronic obstructive pulmonary disease, unspecified: Secondary | ICD-10-CM | POA: Diagnosis not present

## 2022-12-04 DIAGNOSIS — E785 Hyperlipidemia, unspecified: Secondary | ICD-10-CM | POA: Diagnosis not present

## 2022-12-04 DIAGNOSIS — I5030 Unspecified diastolic (congestive) heart failure: Secondary | ICD-10-CM | POA: Diagnosis not present

## 2022-12-10 DIAGNOSIS — E785 Hyperlipidemia, unspecified: Secondary | ICD-10-CM | POA: Diagnosis not present

## 2022-12-10 DIAGNOSIS — E1151 Type 2 diabetes mellitus with diabetic peripheral angiopathy without gangrene: Secondary | ICD-10-CM | POA: Diagnosis not present

## 2022-12-10 DIAGNOSIS — I4891 Unspecified atrial fibrillation: Secondary | ICD-10-CM | POA: Diagnosis not present

## 2022-12-10 DIAGNOSIS — I5030 Unspecified diastolic (congestive) heart failure: Secondary | ICD-10-CM | POA: Diagnosis not present

## 2022-12-10 DIAGNOSIS — I10 Essential (primary) hypertension: Secondary | ICD-10-CM | POA: Diagnosis not present

## 2022-12-10 DIAGNOSIS — D638 Anemia in other chronic diseases classified elsewhere: Secondary | ICD-10-CM | POA: Diagnosis not present

## 2022-12-10 DIAGNOSIS — J449 Chronic obstructive pulmonary disease, unspecified: Secondary | ICD-10-CM | POA: Diagnosis not present

## 2022-12-10 DIAGNOSIS — I739 Peripheral vascular disease, unspecified: Secondary | ICD-10-CM | POA: Diagnosis not present

## 2022-12-10 DIAGNOSIS — I70209 Unspecified atherosclerosis of native arteries of extremities, unspecified extremity: Secondary | ICD-10-CM | POA: Diagnosis not present

## 2022-12-10 DIAGNOSIS — K219 Gastro-esophageal reflux disease without esophagitis: Secondary | ICD-10-CM | POA: Diagnosis not present

## 2022-12-14 DIAGNOSIS — E119 Type 2 diabetes mellitus without complications: Secondary | ICD-10-CM | POA: Diagnosis not present

## 2022-12-14 DIAGNOSIS — J449 Chronic obstructive pulmonary disease, unspecified: Secondary | ICD-10-CM | POA: Diagnosis not present

## 2022-12-23 DIAGNOSIS — I10 Essential (primary) hypertension: Secondary | ICD-10-CM | POA: Diagnosis not present

## 2022-12-23 DIAGNOSIS — I4891 Unspecified atrial fibrillation: Secondary | ICD-10-CM | POA: Diagnosis not present

## 2022-12-23 DIAGNOSIS — I5030 Unspecified diastolic (congestive) heart failure: Secondary | ICD-10-CM | POA: Diagnosis not present

## 2022-12-23 DIAGNOSIS — K219 Gastro-esophageal reflux disease without esophagitis: Secondary | ICD-10-CM | POA: Diagnosis not present

## 2022-12-23 DIAGNOSIS — J449 Chronic obstructive pulmonary disease, unspecified: Secondary | ICD-10-CM | POA: Diagnosis not present

## 2022-12-23 DIAGNOSIS — D638 Anemia in other chronic diseases classified elsewhere: Secondary | ICD-10-CM | POA: Diagnosis not present

## 2022-12-23 DIAGNOSIS — I739 Peripheral vascular disease, unspecified: Secondary | ICD-10-CM | POA: Diagnosis not present

## 2022-12-23 DIAGNOSIS — E785 Hyperlipidemia, unspecified: Secondary | ICD-10-CM | POA: Diagnosis not present

## 2022-12-24 DIAGNOSIS — M6281 Muscle weakness (generalized): Secondary | ICD-10-CM | POA: Diagnosis not present

## 2022-12-28 DIAGNOSIS — M6281 Muscle weakness (generalized): Secondary | ICD-10-CM | POA: Diagnosis not present

## 2022-12-30 IMAGING — CT CT CERVICAL SPINE W/O CM
3 series · 9 of 33 positions shown, 11 images · non-contrast
Comparison: CT head 04/01/2021

CLINICAL DATA: Head trauma, mod-severe; Neck trauma, dangerous
injury mechanism (Age 16-64y). Unwitnessed fall from bed.

EXAM:
CT HEAD WITHOUT CONTRAST
CT CERVICAL SPINE WITHOUT CONTRAST
TECHNIQUE: Multidetector CT imaging of the head and cervical spine was
performed following the standard protocol without intravenous
contrast. Multiplanar CT image reconstructions of the cervical spine
were also generated.

[Series 4: c spine soft · axial · 0.35mm/px · z∈[-237,-237]mm · 1 of 83 slices shown, 2 images]
[im 45/83  soft-tissue]
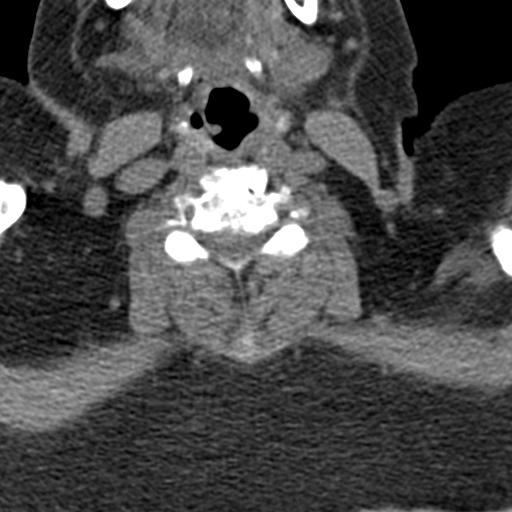
[im 45/83  bone]
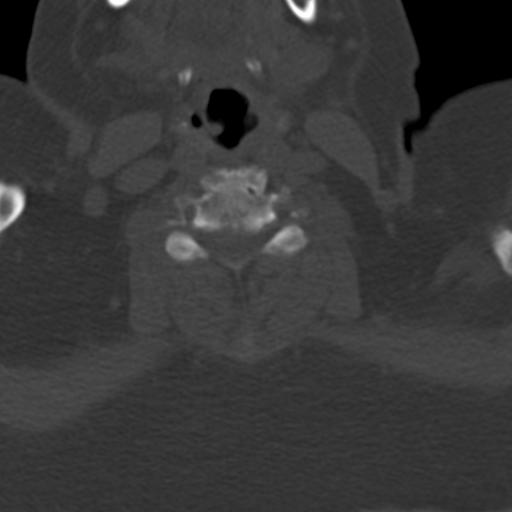

[Series 7: coronal bone · coronal · 0.24mm/px · 3 of 61 slices shown]
[im 13/61  bone]
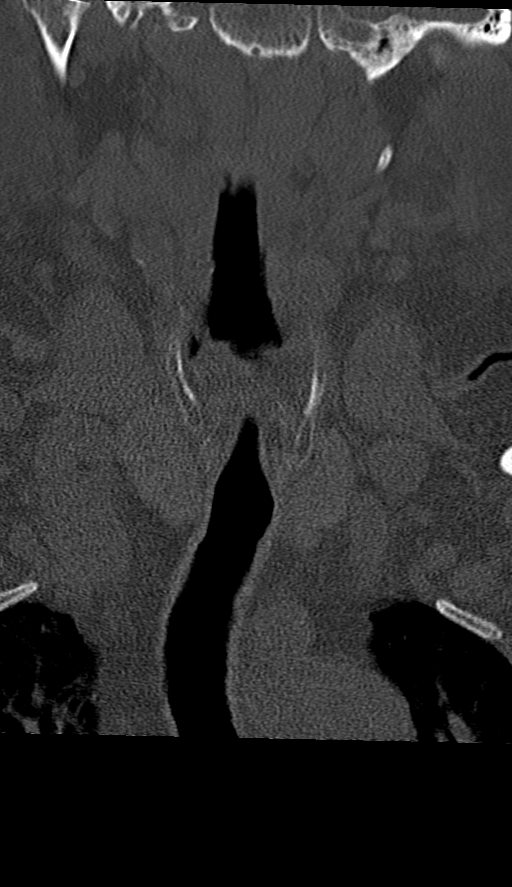
[im 25/61  bone]
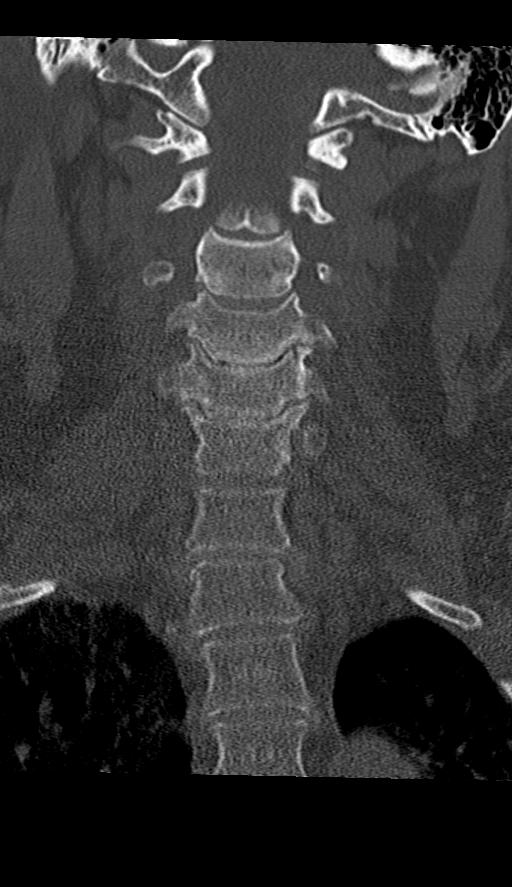
[im 37/61  bone]
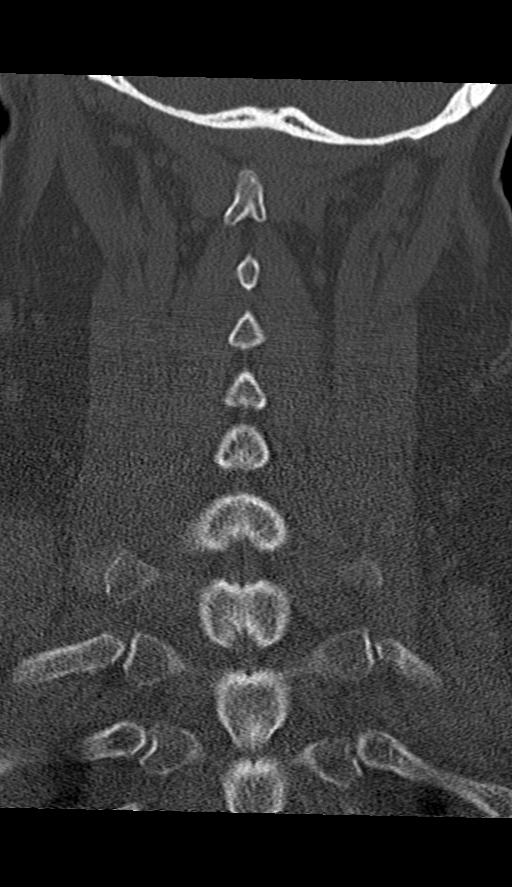

[Series 8: sagittal bone · sagittal · 0.24mm/px · 5 of 61 slices shown, 6 images]
[im 21/61  bone]
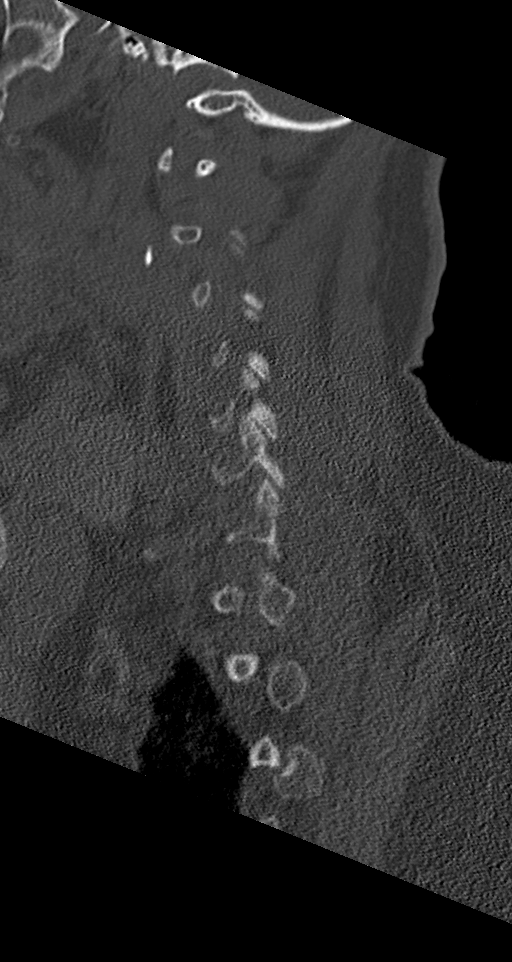
[im 26/61  bone]
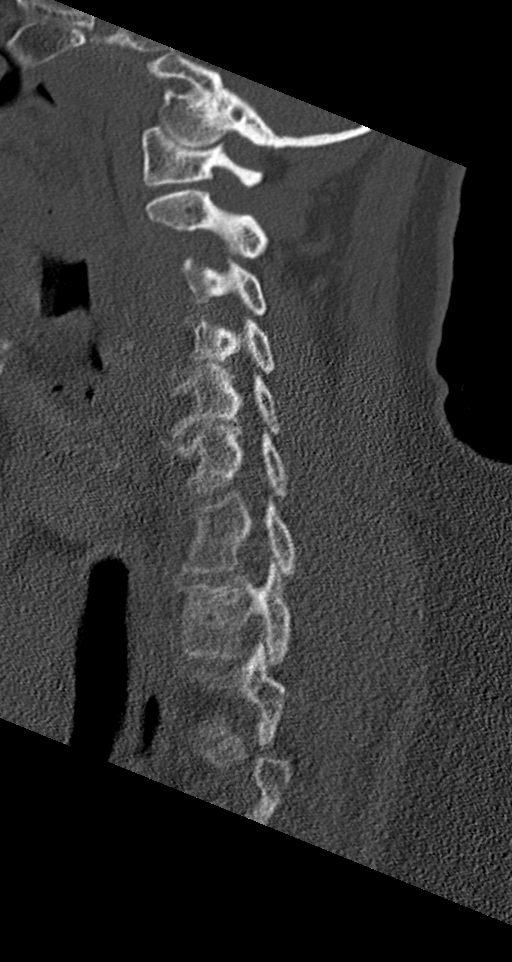
[im 31/61  soft-tissue]
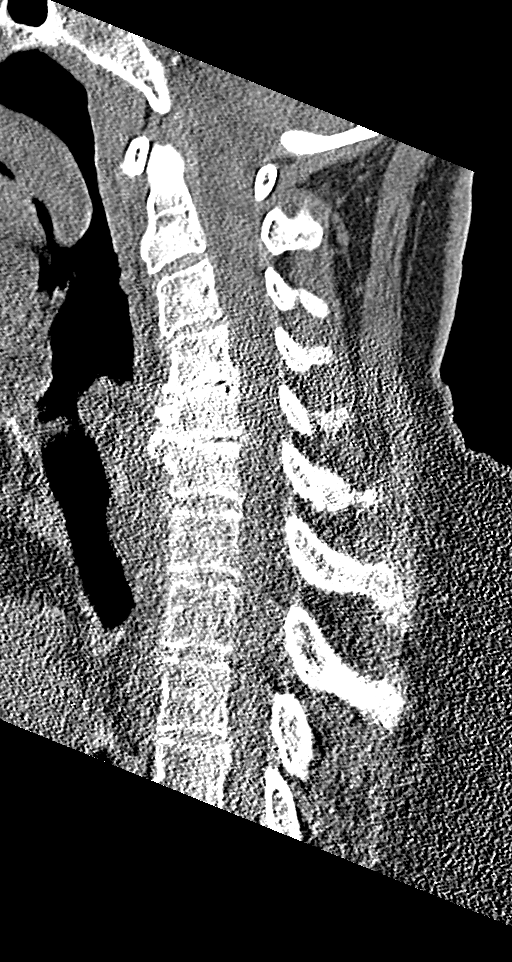
[im 31/61  bone]
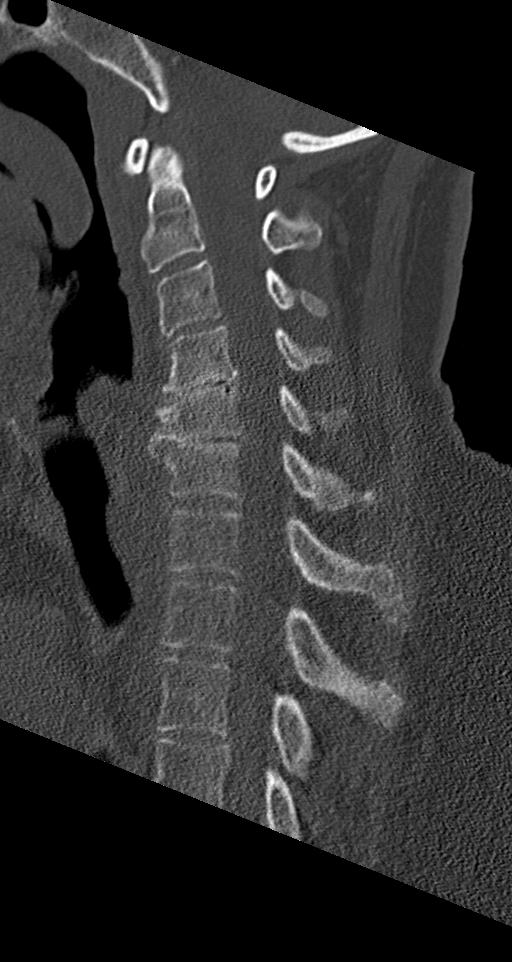
[im 36/61  bone]
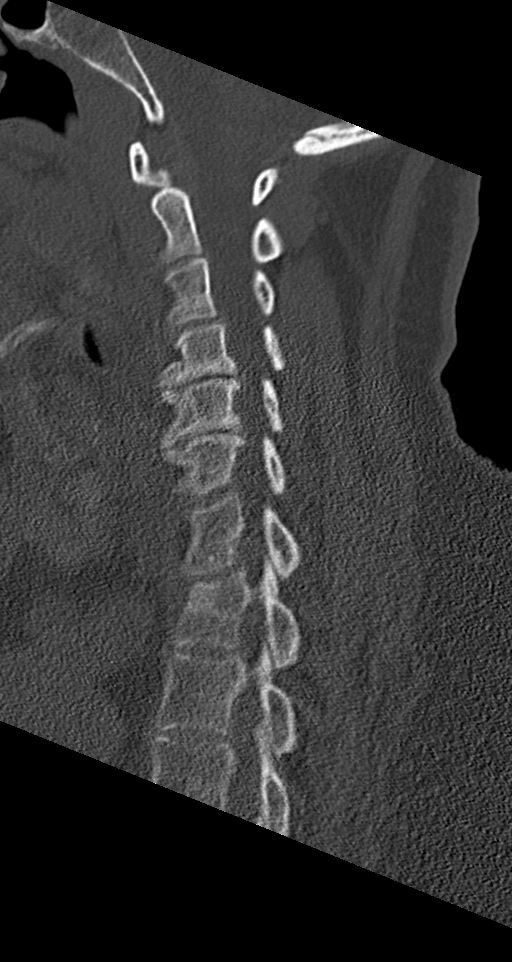
[im 41/61  bone]
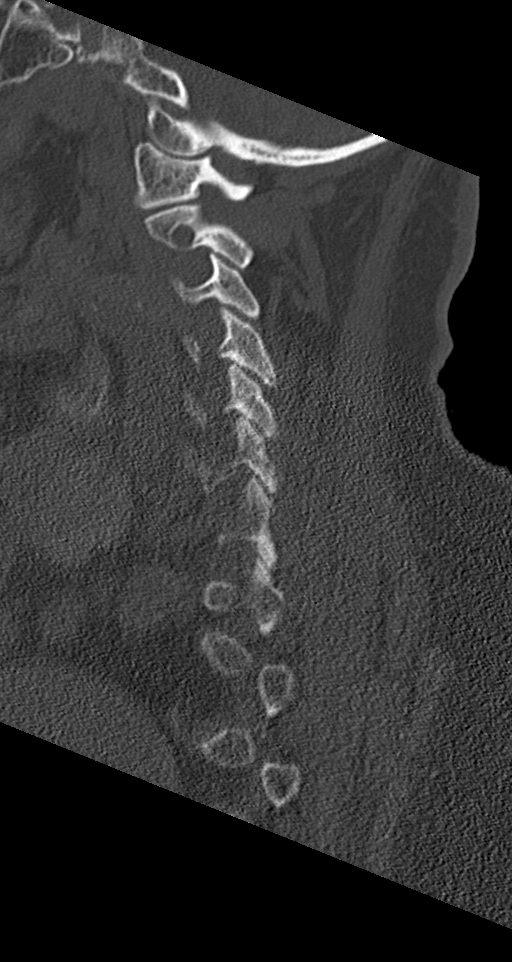

[9 of 33 positions shown; findings below may reference images not displayed]

FINDINGS: CT HEAD FINDINGS

Brain: Normal anatomic configuration. Parenchymal volume loss is
commensurate with the patient's age. Moderate periventricular white
matter changes are present likely reflecting the sequela of small
vessel ischemia. Remote lacunar infarcts are noted within the a left
caudate head, anterior limb of the left internal capsule, and left
thalamus. Remote infarct noted within the left cerebellar
hemisphere. No abnormal intra or extra-axial mass lesion or fluid
collection. No abnormal mass effect or midline shift. No evidence of
acute intracranial hemorrhage or infarct. Ventricular size is
normal. Cerebellum unremarkable.

Vascular: No asymmetric hyperdense vasculature at the skull base.

Skull: Intact

Sinuses/Orbits: Paranasal sinuses are clear. Orbits are
unremarkable.

Other: Mastoid air cells and middle ear cavities are clear.

CT CERVICAL SPINE FINDINGS

Alignment: There is mild cervical kyphosis, possibly positional in
nature. No listhesis.

Skull base and vertebrae: Craniocervical alignment is normal. The
atlantodental interval is not widened. No acute fracture of the
cervical spine. Vertebral body height has been preserved.

Soft tissues and spinal canal: Posterior disc osteophyte complex
results in moderate to severe central canal stenosis with a an AP
diameter of the spinal canal of approximately 7-8 mm and flattening
of the thecal sac at C4-5 and C5-6. Low-dose technique limits
evaluation and the degree of stenosis may be greater at C5-6 than
apparent on this examination. Milder stent of changes are noted at
C6-7. No canal hematoma. No prevertebral soft tissue swelling. No
paravertebral fluid collections identified.

Disc levels: There is intervertebral disc space narrowing and
endplate remodeling at C4-5 and C5-6 in keeping with changes of
moderate to severe degenerative disc disease. Remaining
intervertebral disc heights are preserved. The prevertebral soft
tissues are not thickened on sagittal reformats. Review of the axial
images demonstrates advanced uncovertebral arthrosis at C4-5 and
C5-6 resulting in moderate bilateral neuroforaminal narrowing.

Upper chest: Reticular infiltrates are noted within the visualized
lung apices, not well assessed on this exam.

Other: None
IMPRESSION: No acute intracranial injury.  No calvarial fracture.

No acute fracture or listhesis of the cervical spine.

Advanced degenerative disc disease and degenerative joint disease at
C4-5 and C5-6 resulting in moderate to severe central canal
stenosis. The degree of stenosis is not optimally assessed on this
examination and correlation with neurologic examination is
recommended. If abnormal, MRI examination may better demonstrate the
degree of central canal and neuroforaminal stenosis at these levels.

## 2022-12-30 IMAGING — CT CT HEAD W/O CM
3 series · 14 of 47 positions shown, 16 images · non-contrast
Comparison: CT head 04/01/2021

CLINICAL DATA: Head trauma, mod-severe; Neck trauma, dangerous
injury mechanism (Age 16-64y). Unwitnessed fall from bed.

EXAM:
CT HEAD WITHOUT CONTRAST
CT CERVICAL SPINE WITHOUT CONTRAST
TECHNIQUE: Multidetector CT imaging of the head and cervical spine was
performed following the standard protocol without intravenous
contrast. Multiplanar CT image reconstructions of the cervical spine
were also generated.

[Series 3: head wo · axial · 0.45mm/px · z∈[-159,-34]mm · 8 of 31 slices shown, 10 images]
[im 3/31  brain]
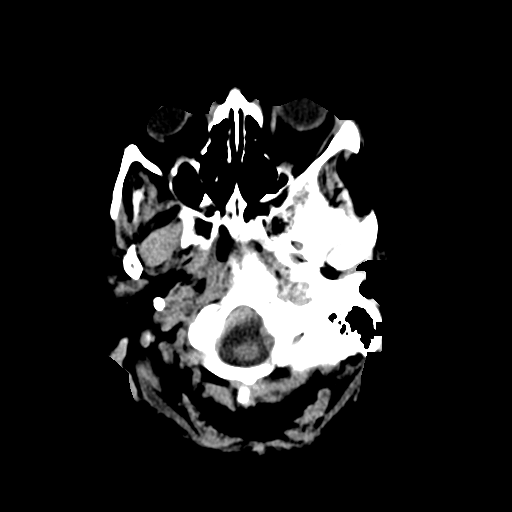
[im 3/31  bone]
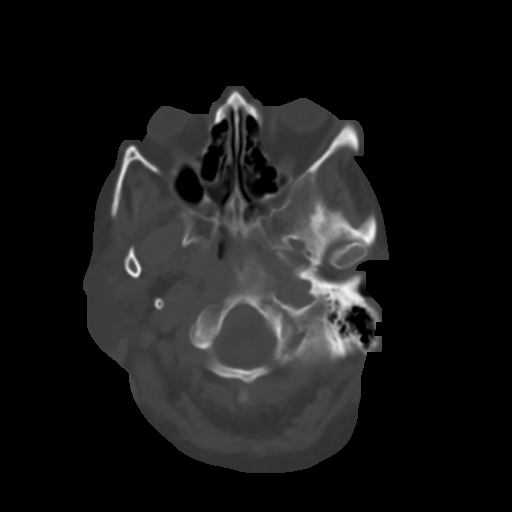
[im 7/31  brain]
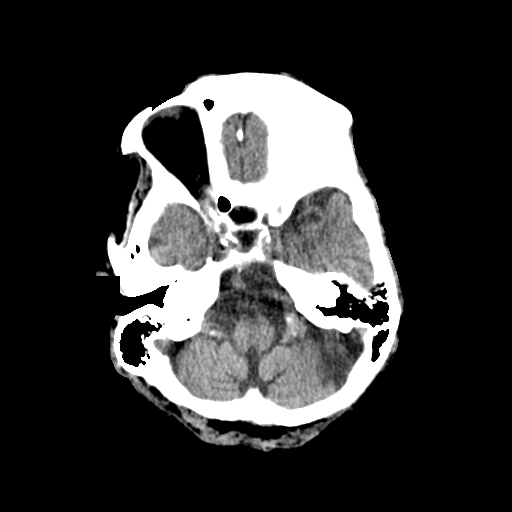
[im 10/31  brain]
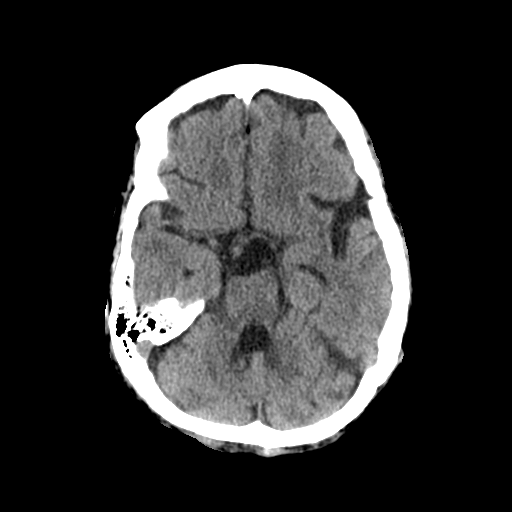
[im 14/31  brain]
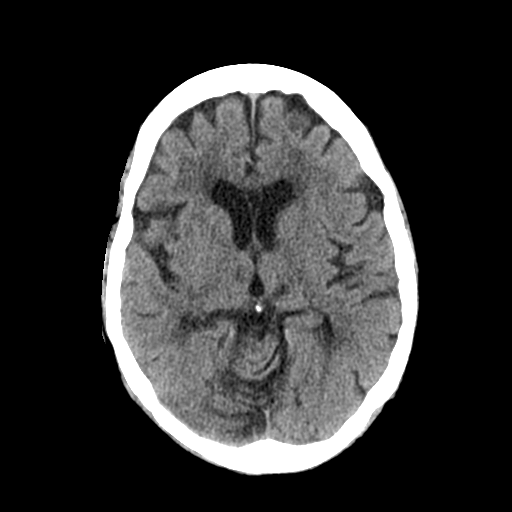
[im 17/31  brain]
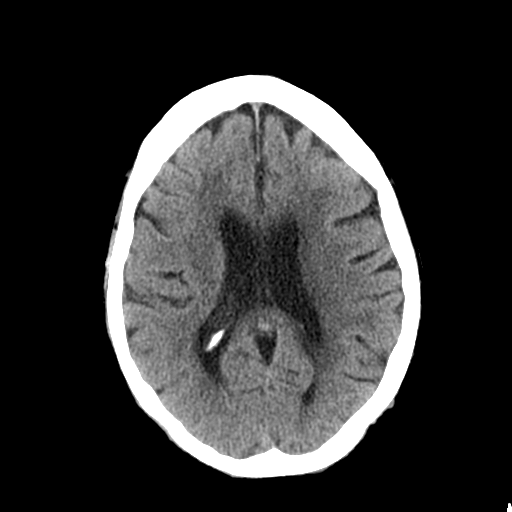
[im 17/31  bone]
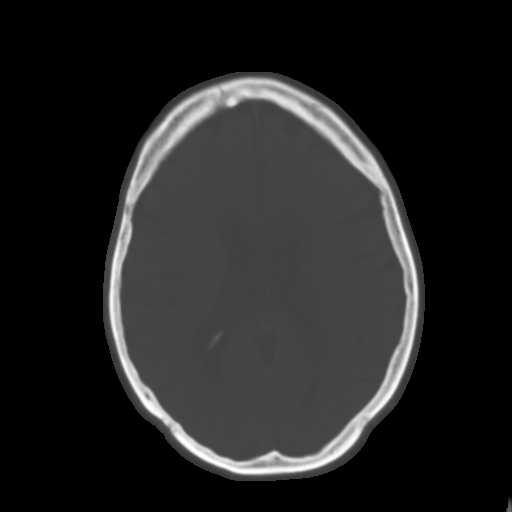
[im 21/31  brain]
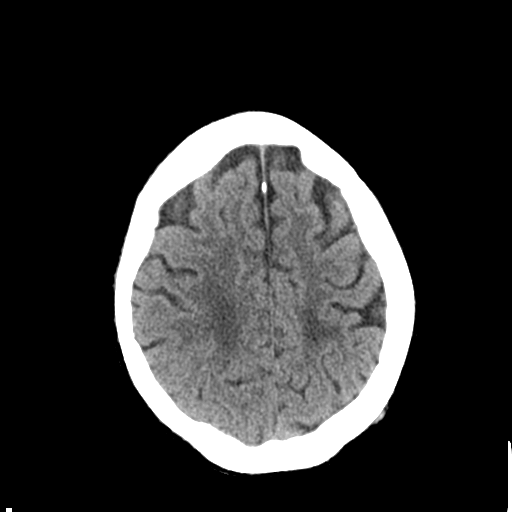
[im 24/31  brain]
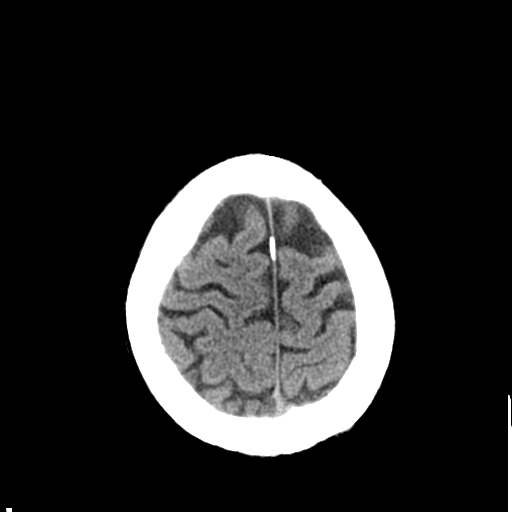
[im 28/31  brain]
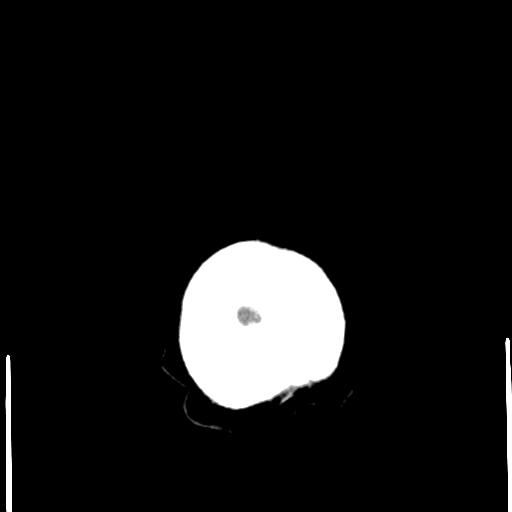

[Series 6: coronal soft tissue · coronal · 0.31mm/px · 3 of 72 slices shown]
[im 24/72  brain]
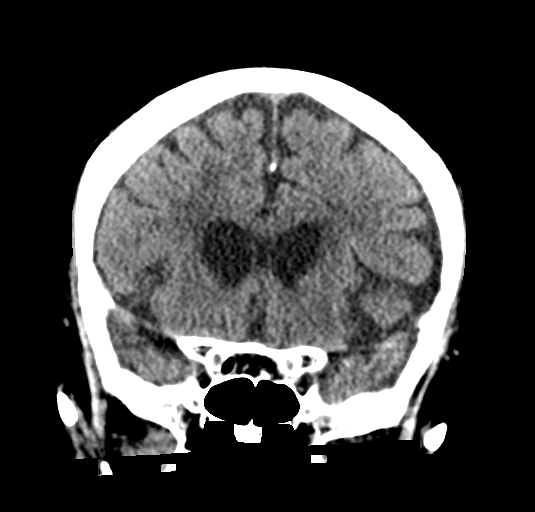
[im 32/72  brain]
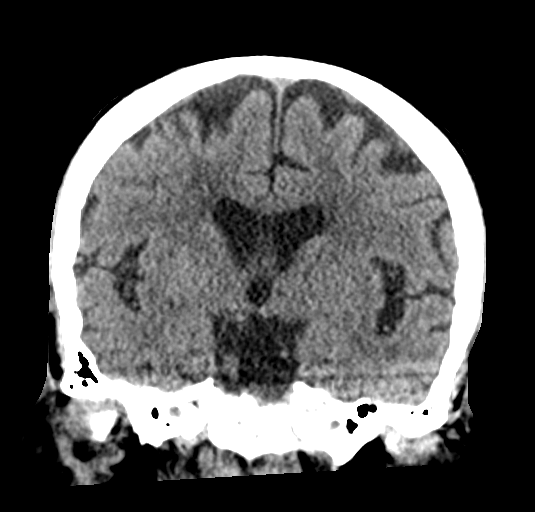
[im 40/72  brain]
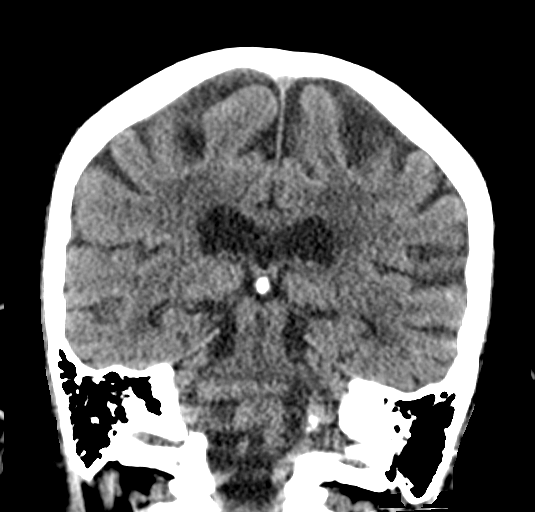

[Series 7: sagittal soft tissue · sagittal · 0.32mm/px · 3 of 57 slices shown]
[im 19/57  brain]
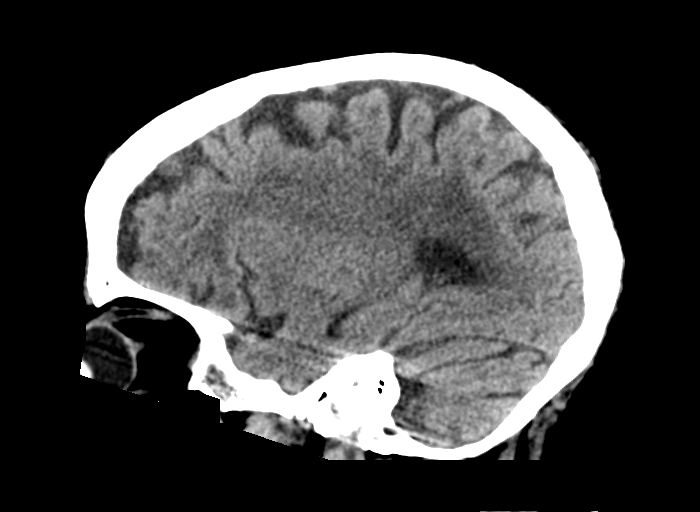
[im 29/57  brain]
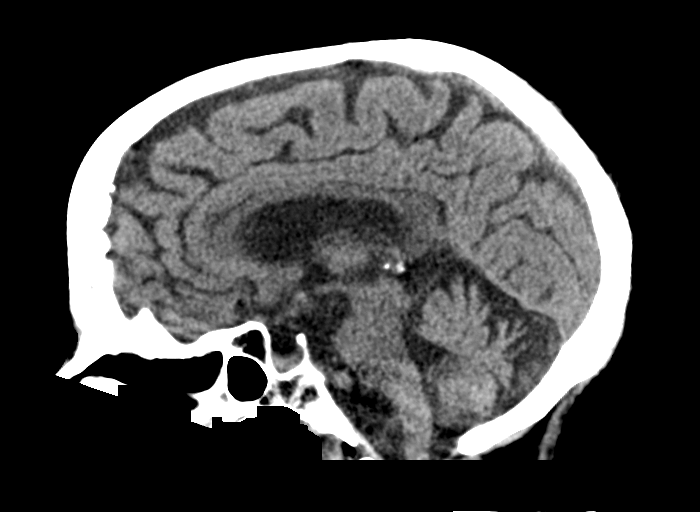
[im 38/57  brain]
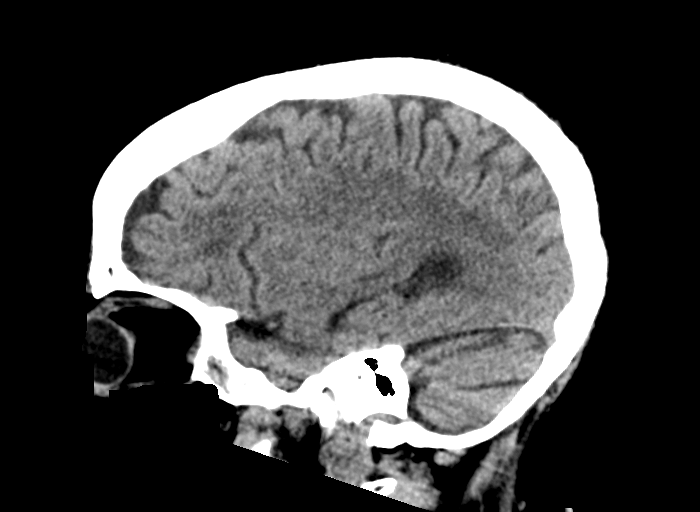

[14 of 47 positions shown; findings below may reference images not displayed]

FINDINGS: CT HEAD FINDINGS

Brain: Normal anatomic configuration. Parenchymal volume loss is
commensurate with the patient's age. Moderate periventricular white
matter changes are present likely reflecting the sequela of small
vessel ischemia. Remote lacunar infarcts are noted within the a left
caudate head, anterior limb of the left internal capsule, and left
thalamus. Remote infarct noted within the left cerebellar
hemisphere. No abnormal intra or extra-axial mass lesion or fluid
collection. No abnormal mass effect or midline shift. No evidence of
acute intracranial hemorrhage or infarct. Ventricular size is
normal. Cerebellum unremarkable.

Vascular: No asymmetric hyperdense vasculature at the skull base.

Skull: Intact

Sinuses/Orbits: Paranasal sinuses are clear. Orbits are
unremarkable.

Other: Mastoid air cells and middle ear cavities are clear.

CT CERVICAL SPINE FINDINGS

Alignment: There is mild cervical kyphosis, possibly positional in
nature. No listhesis.

Skull base and vertebrae: Craniocervical alignment is normal. The
atlantodental interval is not widened. No acute fracture of the
cervical spine. Vertebral body height has been preserved.

Soft tissues and spinal canal: Posterior disc osteophyte complex
results in moderate to severe central canal stenosis with a an AP
diameter of the spinal canal of approximately 7-8 mm and flattening
of the thecal sac at C4-5 and C5-6. Low-dose technique limits
evaluation and the degree of stenosis may be greater at C5-6 than
apparent on this examination. Milder stent of changes are noted at
C6-7. No canal hematoma. No prevertebral soft tissue swelling. No
paravertebral fluid collections identified.

Disc levels: There is intervertebral disc space narrowing and
endplate remodeling at C4-5 and C5-6 in keeping with changes of
moderate to severe degenerative disc disease. Remaining
intervertebral disc heights are preserved. The prevertebral soft
tissues are not thickened on sagittal reformats. Review of the axial
images demonstrates advanced uncovertebral arthrosis at C4-5 and
C5-6 resulting in moderate bilateral neuroforaminal narrowing.

Upper chest: Reticular infiltrates are noted within the visualized
lung apices, not well assessed on this exam.

Other: None
IMPRESSION: No acute intracranial injury.  No calvarial fracture.

No acute fracture or listhesis of the cervical spine.

Advanced degenerative disc disease and degenerative joint disease at
C4-5 and C5-6 resulting in moderate to severe central canal
stenosis. The degree of stenosis is not optimally assessed on this
examination and correlation with neurologic examination is
recommended. If abnormal, MRI examination may better demonstrate the
degree of central canal and neuroforaminal stenosis at these levels.

## 2022-12-31 DIAGNOSIS — M6281 Muscle weakness (generalized): Secondary | ICD-10-CM | POA: Diagnosis not present

## 2023-01-05 ENCOUNTER — Encounter: Payer: Self-pay | Admitting: Family Medicine

## 2023-01-05 ENCOUNTER — Ambulatory Visit (INDEPENDENT_AMBULATORY_CARE_PROVIDER_SITE_OTHER): Payer: 59 | Admitting: Family Medicine

## 2023-01-05 ENCOUNTER — Telehealth: Payer: Self-pay | Admitting: Family Medicine

## 2023-01-05 VITALS — BP 120/81 | HR 71 | Ht 61.0 in | Wt 169.0 lb

## 2023-01-05 DIAGNOSIS — I10 Essential (primary) hypertension: Secondary | ICD-10-CM | POA: Diagnosis not present

## 2023-01-05 DIAGNOSIS — Z1231 Encounter for screening mammogram for malignant neoplasm of breast: Secondary | ICD-10-CM

## 2023-01-05 DIAGNOSIS — E559 Vitamin D deficiency, unspecified: Secondary | ICD-10-CM

## 2023-01-05 DIAGNOSIS — E7849 Other hyperlipidemia: Secondary | ICD-10-CM

## 2023-01-05 DIAGNOSIS — L853 Xerosis cutis: Secondary | ICD-10-CM | POA: Diagnosis not present

## 2023-01-05 DIAGNOSIS — E1159 Type 2 diabetes mellitus with other circulatory complications: Secondary | ICD-10-CM | POA: Diagnosis not present

## 2023-01-05 DIAGNOSIS — E0789 Other specified disorders of thyroid: Secondary | ICD-10-CM

## 2023-01-05 DIAGNOSIS — M6281 Muscle weakness (generalized): Secondary | ICD-10-CM | POA: Diagnosis not present

## 2023-01-05 MED ORDER — AMMONIUM LACTATE 12 % EX LOTN: 1.0000 | TOPICAL_LOTION | CUTANEOUS | 4 refills | Status: AC | PRN

## 2023-01-05 NOTE — Telephone Encounter (Signed)
I talked with Dr Moshe Cipro and she CAN NOT take on any additional patients.  I have mailed the patient a reminder appointment information with a note.  Dr Moshe Cipro has said No.  Please keep appt with [facility].

## 2023-01-05 NOTE — Progress Notes (Addendum)
New Patient Office Visit   Subjective   Patient ID: [name], female    DOB: [birth year]  Age: 57 y.o. MRN: [id number]  CC:  Chief Complaint  Patient presents with   Establish Care    New patient. Establashing care.     HPI [name] 57 year old female, presents to establish care. 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 [facility]), Breast mass, left (2013), CHF (congestive heart failure) ([place]), Chronic anemia, CKD (chronic kidney disease), stage III ([place]), Cocaine abuse ([place]), COPD (chronic obstructive pulmonary disease) ([place]), Diabetes mellitus, type 2 ([place]), Diabetic Charcot foot ([place]), DKA (diabetic ketoacidosis) ([place]) (10/29/2020), Essential hypertension, H/O open hand wound (11/22/2018), History of cardiomyopathy, History of GI bleed (2011), Hyperlipidemia, Lactic acid acidosis ([id number]), Metabolic encephalopathy (123XX123), Noncompliance, Obesity, Oral candidiasis (03/10/2021), PAF (paroxysmal atrial fibrillation) ([place]), Panic attacks, PAT (paroxysmal atrial tachycardia), Pneumonia due to COVID-19 virus (12/03/2020), Seizures ([place]) (10/01/2020), Sepsis due to skin infection ([place]) (04/01/2021), Shock ([place]) (10/29/2020), Sleep apnea, Stroke ([place]) (2018), Thrombocytosis (08/02/2017), Tobacco abuse, UGI bleed, and Urinary incontinence.  Patient here for Hypertension management . She is exercising and is adherent to low salt diet.  Blood pressure is well controlled at living facility. Patient denies   cardiac symptoms chest pain, chest pressure/discomfort, dyspnea, fatigue, lower extremity edema, paroxysmal nocturnal dyspnea, and tachypnea. Cardiovascular risk factors: diabetes mellitus, dyslipidemia, hypertension, obesity (BMI >= 30 kg/m2), and sedentary lifestyle.    Outpatient Encounter Medications as of 01/05/2023  Medication Sig   acetaminophen (TYLENOL) 325 MG tablet Take 2 tablets (650 mg total) by mouth every 6 (six) hours as needed for headache, fever or mild pain. (Patient taking differently: Take 650 mg by mouth every 6 (six) hours as needed for fever (or pain).)   amLODipine (NORVASC) 5 MG tablet Take 5 mg by mouth daily.   aspirin EC 81 MG EC tablet Take 1 tablet (81 mg total) by mouth daily. Swallow whole.   FLUoxetine (PROZAC) 40 MG capsule Take 2 capsules by mouth daily.   HUMALOG KWIKPEN 100 UNIT/ML KwikPen Inject 1-10 Units into the skin See admin instructions. Inject 1-10 units into the skin before meals and at bedtime, PER SLIDING SCALE: BGL 151-200 = 1 unit; 201-250 = 2 units; 251-300 = 4 units; 301-350 = 6 units; 351-400 = 8 units; 401-450 = 10 units   levETIRAcetam (KEPPRA) 500 MG tablet Take 1 tablet (500 mg total) by mouth 2 (two) times daily.   loperamide (IMODIUM A-D) 2 MG tablet Take 4 mg by mouth 4 (four) times daily as needed for diarrhea or loose stools.   metoprolol succinate (TOPROL-XL) 25 MG 24 hr tablet Take 50 mg by mouth daily.   Multiple Vitamin (MULTIVITAMIN WITH MINERALS) TABS tablet Take 1 tablet by mouth daily.   olopatadine (PATADAY) 0.1 % ophthalmic solution Place 1 drop into both eyes 2 (two) times daily.   oxybutynin (DITROPAN-XL) 5 MG 24 hr tablet Take 5 mg by mouth in the morning.   rosuvastatin (CRESTOR) 10 MG tablet Take 10 mg by mouth every evening.   thiamine 100 MG tablet Take 1 tablet (100 mg total) by mouth daily.  tiotropium (SPIRIVA HANDIHALER) 18 MCG inhalation capsule Place 18 mcg into inhaler and inhale daily as needed (Shortness of  breath).   traMADol (ULTRAM) 50 MG tablet Take 0.5 tablets by mouth every 6 (six) hours as needed for moderate pain or severe pain.   [DISCONTINUED] ammonium lactate (AMLACTIN) 12 % lotion Apply 1 application. topically as needed for dry skin. (Patient taking differently: Apply 1 application  topically as needed for dry skin (to toenails).)   ALPRAZolam (XANAX) 0.5 MG tablet Take 0.5 mg by mouth 2 (two) times daily.   ammonium lactate (AMLACTIN) 12 % lotion Apply 1 Application topically as needed for dry skin.   ascorbic acid (VITAMIN C) 500 MG tablet Take 500 mg by mouth 2 (two) times daily.   benztropine (COGENTIN) 0.5 MG tablet Take 1 tablet (0.5 mg total) by mouth 2 (two) times daily.   cetirizine (ZYRTEC ALLERGY) 10 MG tablet Take 1 tablet (10 mg total) by mouth daily.   Dextromethorphan-guaiFENesin (GERI-TUSSIN DM) 10-100 MG/5ML liquid Take 10 mLs by mouth every 12 (twelve) hours.   ELIQUIS 5 MG TABS tablet Take 5 mg by mouth 2 (two) times daily.   feeding supplement (ENSURE ENLIVE / ENSURE PLUS) LIQD Take 237 mLs by mouth 3 (three) times daily between meals.   mirtazapine (REMERON) 7.5 MG tablet Take 7.5 mg by mouth at bedtime. (Patient not taking: Reported on 01/05/2023)   omeprazole (PRILOSEC) 20 MG capsule Take 20 mg by mouth daily before breakfast.   sennosides-docusate sodium (SENOKOT-S) 8.6-50 MG tablet Take 1 tablet by mouth in the morning and at bedtime. (Patient not taking: Reported on 01/05/2023)   zinc sulfate 220 (50 Zn) MG capsule Take 1 capsule (220 mg total) by mouth daily. (Patient not taking: Reported on 01/05/2023)   [DISCONTINUED] albuterol (PROAIR HFA) 108 (90 Base) MCG/ACT inhaler INHALE 2 PUFFS EVERY 6 HOURS AS NEEDED FOR SHORTNESS OF BREATH/WHEEZING. (Patient not taking: Reported on 04/21/2021)   No facility-administered encounter medications on file as of 01/05/2023.    Past Surgical History:  Procedure Laterality Date   ANGIOPLASTY ILLIAC ARTERY Right 04/23/2021    Procedure: ANGIOPLASTY AND STENT SUPERFICIAL FEMORAL ARTERY;  Surgeon: Cherre Robins, MD;  Location: [facility];  Service: Vascular;  Laterality: Right;   AORTOGRAM  04/23/2021   Procedure: AORTOGRAM;  Surgeon: Cherre Robins, MD;  Location: [facility];  Service: Vascular;;   AORTOGRAM Left 04/30/2021   Procedure: left lower extremity angiogram with second order cannulation;  Surgeon: Cherre Robins, MD;  Location: [facility];  Service: Vascular;  Laterality: Left;   BIOPSY  11/12/2020   Procedure: BIOPSY;  Surgeon: Harvel Quale, MD;  [facility];  Service: Gastroenterology;;   BREAST BIOPSY     CARDIAC CATHETERIZATION N/A 07/28/2016   Procedure: Left Heart Cath and Coronary Angiography;  Surgeon: Jettie Booze, MD;  Location: [facility];  Service: Cardiovascular;  Laterality: N/A;   COLONOSCOPY N/A 05/10/2019   Procedure: COLONOSCOPY;  Surgeon: Danie Binder, MD;  [facility];  Service: Endoscopy;  Laterality: N/A;  Phenergan 12.5 mg IV in pre-op   DILATION AND CURETTAGE OF UTERUS     ESOPHAGOGASTRODUODENOSCOPY (EGD) WITH PROPOFOL N/A 11/12/2020   Procedure: ESOPHAGOGASTRODUODENOSCOPY (EGD) WITH PROPOFOL;  Surgeon: Harvel Quale, MD;  [facility];  Service: Gastroenterology;  Laterality: N/A;   I & D EXTREMITY Bilateral 09/22/2017   Procedure: BILATERAL DEBRIDEMENT LEG/FOOT ULCERS, APPLY VERAFLO WOUND VAC;  Surgeon: Newt Minion,  MD;  Location: [facility];  Service: Orthopedics;  Laterality: Bilateral;   I & D EXTREMITY Right 10/11/2018   Procedure: IRRIGATION AND DEBRIDEMENT RIGHT HAND;  Surgeon: [name], MD;  Location: [facility];  Service: Orthopedics;  Laterality: Right;   I & D EXTREMITY Right 10/13/2018   Procedure: REPEAT IRRIGATION AND DEBRIDEMENT RIGHT HAND;  Surgeon: [name], MD;  Location: [facility];  Service: Orthopedics;  Laterality: Right;   I & D EXTREMITY Right 11/22/2018   Procedure: IRRIGATION AND DEBRIDEMENT  AND PINNING RIGHT HAND;  Surgeon: [name], MD;  Location: [facility];  Service: Orthopedics;  Laterality: Right;   IR RADIOLOGIST EVAL & MGMT  07/05/2018   LOWER EXTREMITY ANGIOGRAM Bilateral 04/23/2021   Procedure: RIGHT  AND LEFT LOWER EXTREMITY ANGIOGRAM;  Surgeon: Cherre Robins, MD;  Location: [facility];  Service: Vascular;  Laterality: Bilateral;   POLYPECTOMY  05/10/2019   Procedure: POLYPECTOMY;  Surgeon: Danie Binder, MD;  [facility];  Service: Endoscopy;;   SKIN SPLIT GRAFT Bilateral 09/28/2017   Procedure: BILATERAL SPLIT THICKNESS SKIN GRAFT LEGS/FEET AND APPLY VAC;  Surgeon: Newt Minion, MD;  Location: [facility];  Service: Orthopedics;  Laterality: Bilateral;   SKIN SPLIT GRAFT Right 11/22/2018   Procedure: SKIN GRAFT SPLIT THICKNESS;  Surgeon: [name], MD;  Location: [facility];  Service: Orthopedics;  Laterality: Right;    Review of Systems  Constitutional:  Negative for chills and fever.  HENT:  Negative for ear pain.   Eyes:  Negative for blurred vision.  Respiratory:  Negative for shortness of breath.   Cardiovascular:  Negative for chest pain.  Gastrointestinal:  Negative for abdominal pain, nausea and vomiting.  Genitourinary:  Negative for dysuria.  Neurological:  Negative for dizziness and headaches.      Objective    BP 120/81   Pulse 71   Ht '5\' 1"'$  (1.549 m)   Wt 169 lb 0.6 oz (76.7 kg)   LMP 05/19/2016   SpO2 90%   BMI 31.94 kg/m   Physical Exam Cardiovascular:     Rate and Rhythm: Normal rate and regular rhythm.     Pulses: Normal pulses.     Heart sounds: Normal heart sounds.  Pulmonary:     Effort: Pulmonary effort is normal.     Breath sounds: Normal breath sounds.  Musculoskeletal:        General: No tenderness.     Lumbar back: Decreased range of motion.     Right hip: Decreased strength.     Left hip: Decreased strength.     Right lower leg: No edema.     Left lower leg: No edema.     Right foot: Decreased range of motion.      Left foot: Decreased range of motion.  Skin:    General: Skin is warm and dry.     Capillary Refill: Capillary refill takes less than 2 seconds.  Neurological:     General: No focal deficit present.     Mental Status: She is alert.  Psychiatric:        Mood and Affect: Mood normal.       Assessment & Plan:  Essential hypertension Assessment & Plan: Vitals:   01/05/23 1053 01/05/23 1125  BP: 116/68 120/81  Blood pressure well controlled.Labs ordered in today's visit. Continue Amlodipine 5 mg and Metoprolol 25 mg daily.        Explained non pharmacological interventions such as low salt, DASH diet discussed.  Educated on stress reduction and physical activity. Discussed signs and symptoms of major cardiovascular event and need to present to the ED. Follow up in 3 months. Patient verbalizes understanding regarding plan of care and all questions answered.   Orders: -     CBC with Differential/Platelet -     CMP14+EGFR -     Microalbumin / creatinine urine ratio  Type 2 diabetes mellitus with vascular disease (HCC) -     Hemoglobin A1c  Vitamin D deficiency -     VITAMIN D 25 Hydroxy (Vit-D Deficiency, Fractures)  Other specified disorders of thyroid -     TSH + free T4  Other hyperlipidemia -     Lipid panel  Encounter for screening mammogram for malignant neoplasm of breast -     Digital Screening Mammogram, Left and Right; Future  Dry skin -     Ammonium Lactate; Apply 1 Application topically as needed for dry skin.  Dispense: 400 g; Refill: 4  Primary hypertension Assessment & Plan: Vitals:   01/05/23 1053 01/05/23 1125  BP: 116/68 120/81  Blood pressure well controlled.Labs ordered in today's visit. Continue Amlodipine 5 mg and Metoprolol 25 mg daily.        Explained non pharmacological interventions such as low salt, DASH diet discussed. Educated on stress reduction and physical activity. Discussed signs and symptoms of major cardiovascular event and need to  present to the ED. Follow up in 3 months. Patient verbalizes understanding regarding plan of care and all questions answered.      Return in about 3 months (around 04/07/2023) for chronic follow-up, hypertension, diabetes.   Renard Hamper Ria Comment, FNP

## 2023-01-05 NOTE — Assessment & Plan Note (Addendum)
Vitals:   01/05/23 1053 01/05/23 1125  BP: 116/68 120/81  Blood pressure well controlled.Labs ordered in today's visit. Continue Amlodipine 5 mg and Metoprolol 25 mg daily.        Explained non pharmacological interventions such as low salt, DASH diet discussed. Educated on stress reduction and physical activity. Discussed signs and symptoms of major cardiovascular event and need to present to the ED. Follow up in 3 months. Patient verbalizes understanding regarding plan of care and all questions answered.

## 2023-01-05 NOTE — Patient Instructions (Signed)
It was pleasure meeting with you today. Please take medications as prescribed. Follow up with your primary health provider if any health concerns arises. If symptoms worsen please contact your primary care provider and/or visit the emergency department.  

## 2023-01-07 DIAGNOSIS — M6281 Muscle weakness (generalized): Secondary | ICD-10-CM | POA: Diagnosis not present

## 2023-01-08 IMAGING — CT CT HEAD W/O CM
4 series · 16 of 37 positions shown, 17 images · non-contrast
Comparison: CT head and cervical spine dated June 08, 2021.

CLINICAL DATA: Fall.

EXAM:
CT HEAD WITHOUT CONTRAST
CT CERVICAL SPINE WITHOUT CONTRAST
TECHNIQUE: Multidetector CT imaging of the head and cervical spine was
performed following the standard protocol without intravenous
contrast. Multiplanar CT image reconstructions of the cervical spine
were also generated.

[Series 3: head without · axial · non-contrast · 0.41mm/px · z∈[+1197,+1297]mm · 4 of 34 slices shown, 5 images]
[im 7/34  brain]
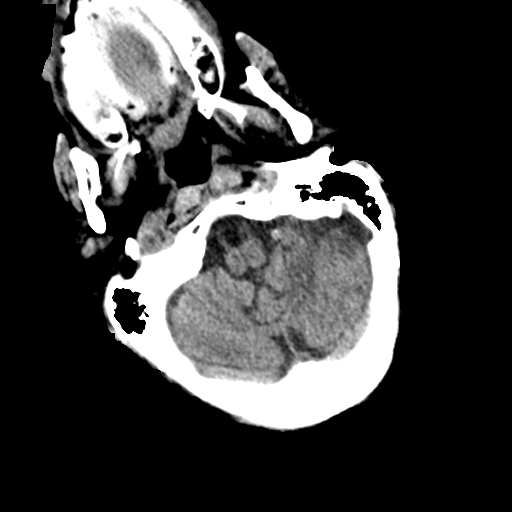
[im 7/34  bone]
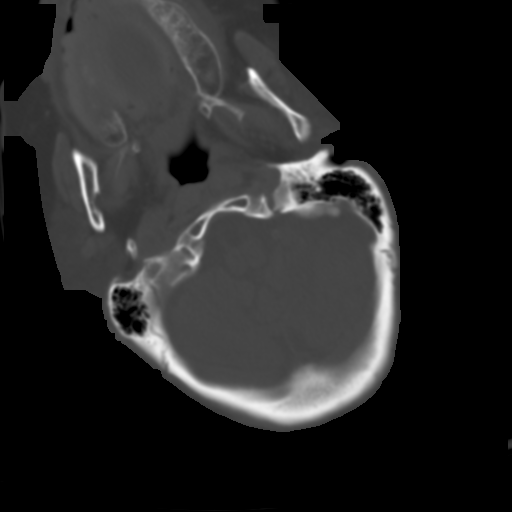
[im 14/34  brain]
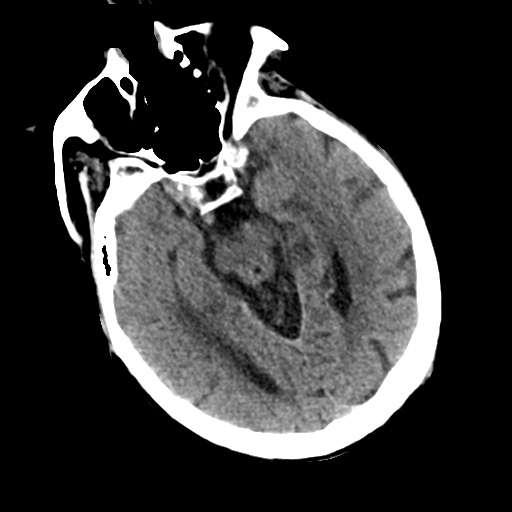
[im 20/34  brain]
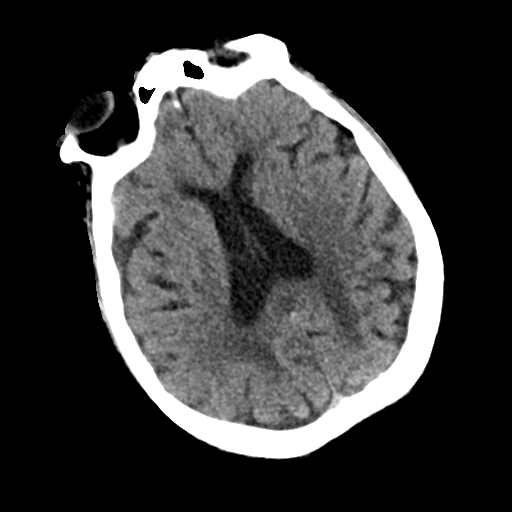
[im 27/34  brain]
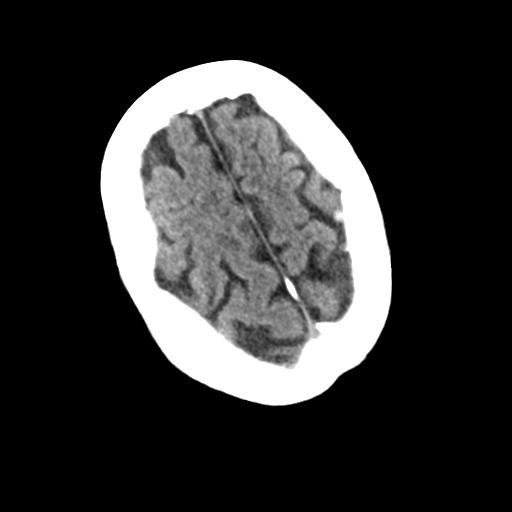

[Series 5: head without sag · sagittal · non-contrast · 0.36mm/px · 3 of 50 slices shown]
[im 17/50  brain]
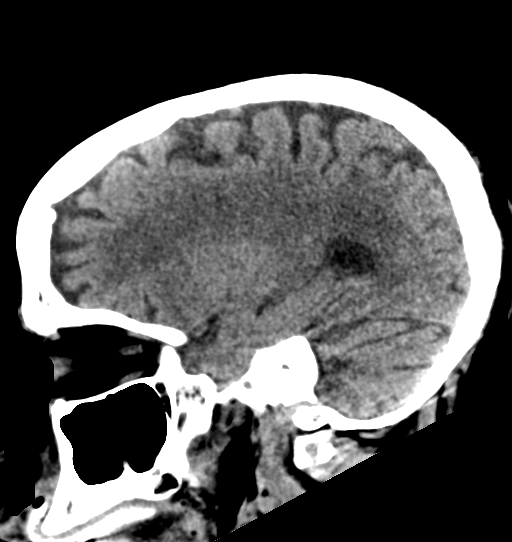
[im 25/50  brain]
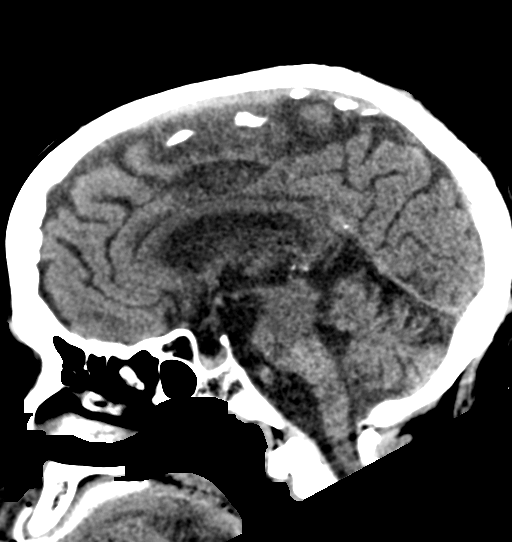
[im 33/50  brain]
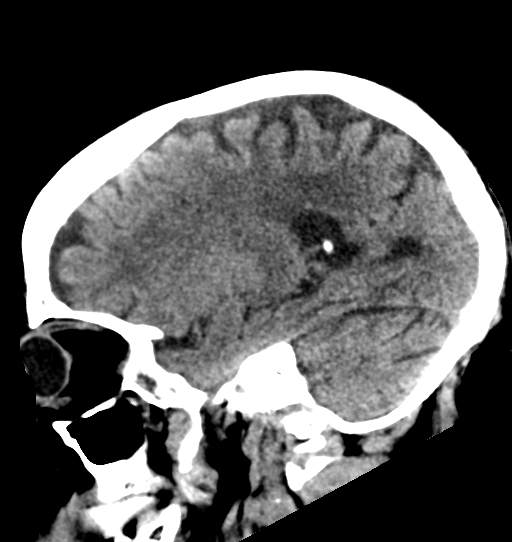

[Series 6: ax · axial · 0.29mm/px · z∈[+1265,+1343]mm · 6 of 44 slices shown]
[im 7/44  brain]
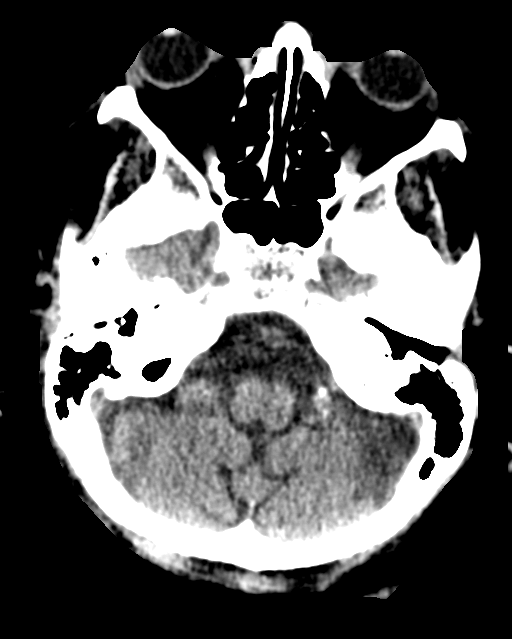
[im 13/44  brain]
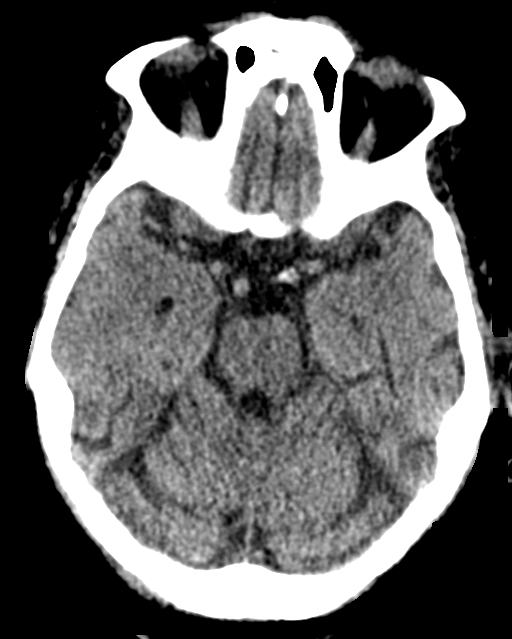
[im 19/44  brain]
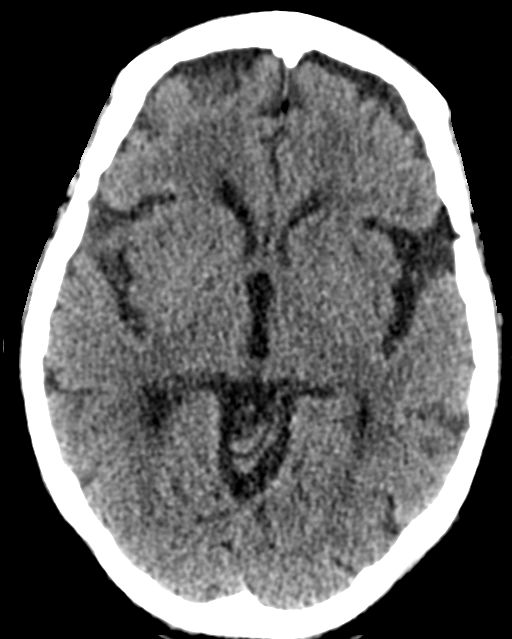
[im 25/44  brain]
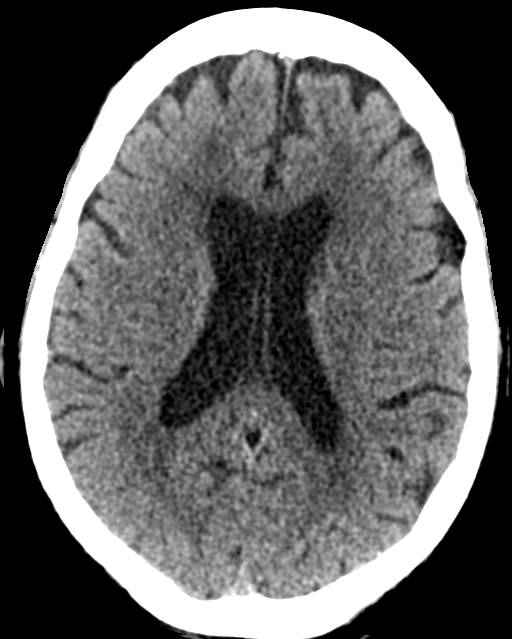
[im 31/44  brain]
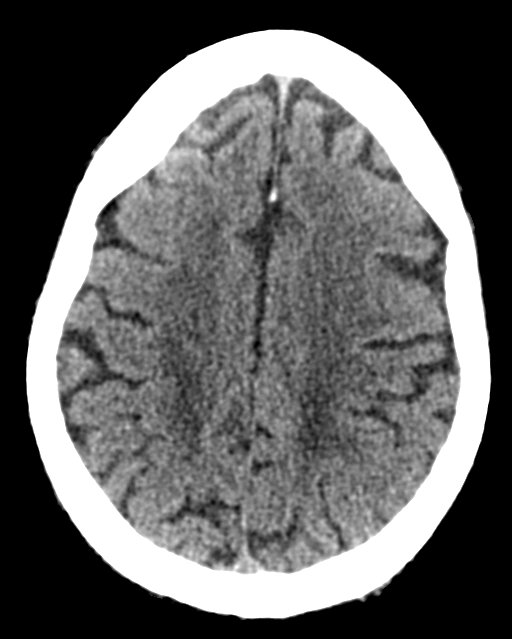
[im 37/44  brain]
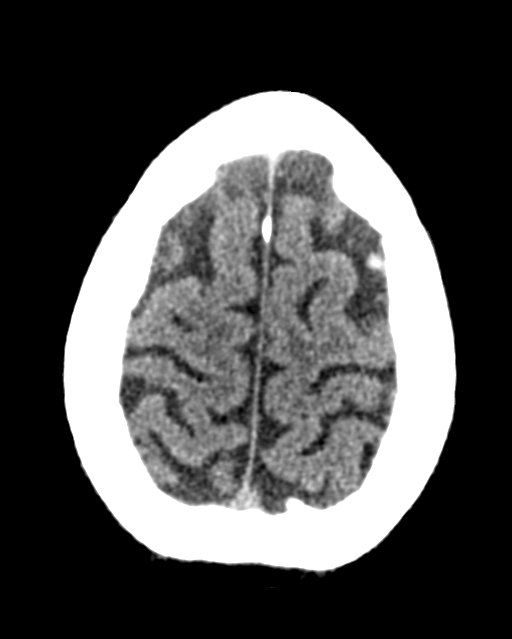

[Series 7: head bone · axial · 0.41mm/px · z∈[+1177,+1221]mm · 3 of 84 slices shown]
[im 6/84  bone]
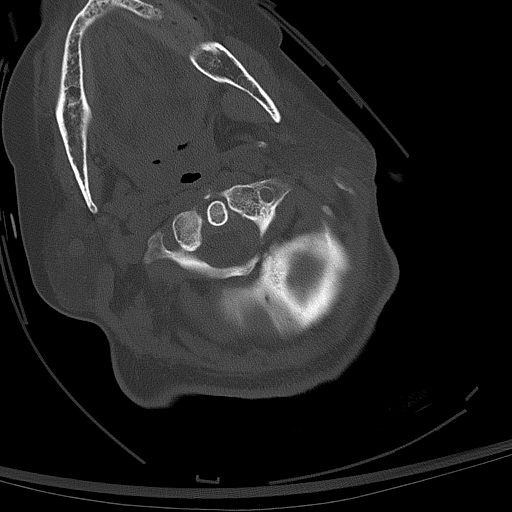
[im 17/84  bone]
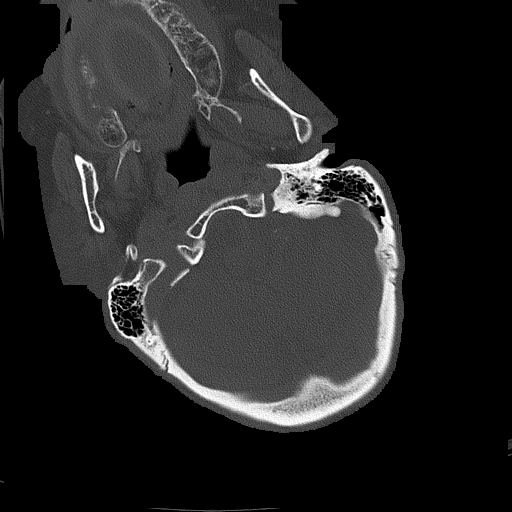
[im 28/84  bone]
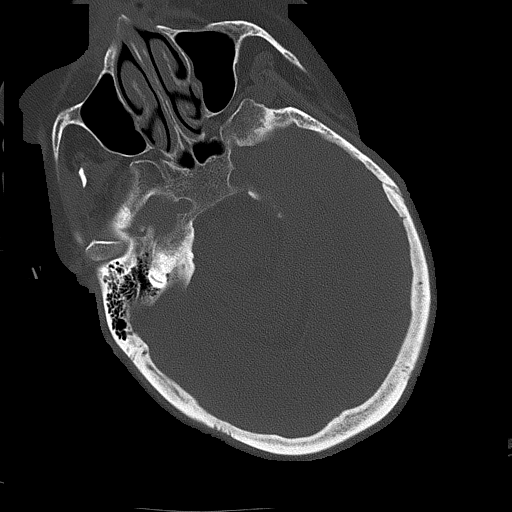

[16 of 37 positions shown; findings below may reference images not displayed]

FINDINGS: CT HEAD FINDINGS

Brain: No evidence of acute infarction, hemorrhage, hydrocephalus,
extra-axial collection or mass lesion/mass effect. Stable atrophy
and chronic microvascular ischemic changes. Old left cerebellar
infarct again noted. Old left caudate head and left thalamic lacunar
infarcts again noted.

Vascular: Atherosclerotic vascular calcification of the carotid
siphons. No hyperdense vessel.

Skull: Normal. Negative for fracture or focal lesion.

Sinuses/Orbits: No acute finding.

Other: None.

CT CERVICAL SPINE FINDINGS

Alignment: No traumatic malalignment.

Skull base and vertebrae: No acute fracture. No primary bone lesion
or focal pathologic process.

Soft tissues and spinal canal: No prevertebral fluid or swelling. No
visible canal hematoma.

Disc levels: Unchanged multilevel disc height loss, severe at C4-C5
and C5-C6.

Upper chest: Negative.

Other: None.
IMPRESSION: 1. No acute intracranial abnormality. Stable atrophy, chronic
microvascular ischemic changes and old infarcts.
2. No acute cervical spine fracture or traumatic malalignment.
Unchanged multilevel cervical spondylosis.

## 2023-01-11 DIAGNOSIS — M6281 Muscle weakness (generalized): Secondary | ICD-10-CM | POA: Diagnosis not present

## 2023-01-12 DIAGNOSIS — M6281 Muscle weakness (generalized): Secondary | ICD-10-CM | POA: Diagnosis not present

## 2023-01-18 DIAGNOSIS — M6281 Muscle weakness (generalized): Secondary | ICD-10-CM | POA: Diagnosis not present

## 2023-01-19 ENCOUNTER — Other Ambulatory Visit: Payer: Self-pay | Admitting: Internal Medicine

## 2023-01-19 ENCOUNTER — Other Ambulatory Visit: Payer: Self-pay | Admitting: Family Medicine

## 2023-01-20 ENCOUNTER — Telehealth: Payer: Self-pay | Admitting: Family Medicine

## 2023-01-20 ENCOUNTER — Other Ambulatory Visit: Payer: Self-pay | Admitting: Family Medicine

## 2023-01-20 NOTE — Telephone Encounter (Signed)
[name] [facility], [phone]   States per their phar pt testing supplies are being denied??  Pt needs refill on Test strips, lancets & needs

## 2023-01-20 NOTE — Telephone Encounter (Signed)
Spoke with [facility], they are sending the order to [name].

## 2023-01-21 DIAGNOSIS — M6281 Muscle weakness (generalized): Secondary | ICD-10-CM | POA: Diagnosis not present

## 2023-01-22 IMAGING — DX DG CHEST 1V PORT
1 series · 1 of 1 positions shown · non-contrast
Comparison: Chest x-ray dated April 21, 2021.

CLINICAL DATA: Altered mental status.

EXAM:
PORTABLE CHEST 1 VIEW

[chest ap]
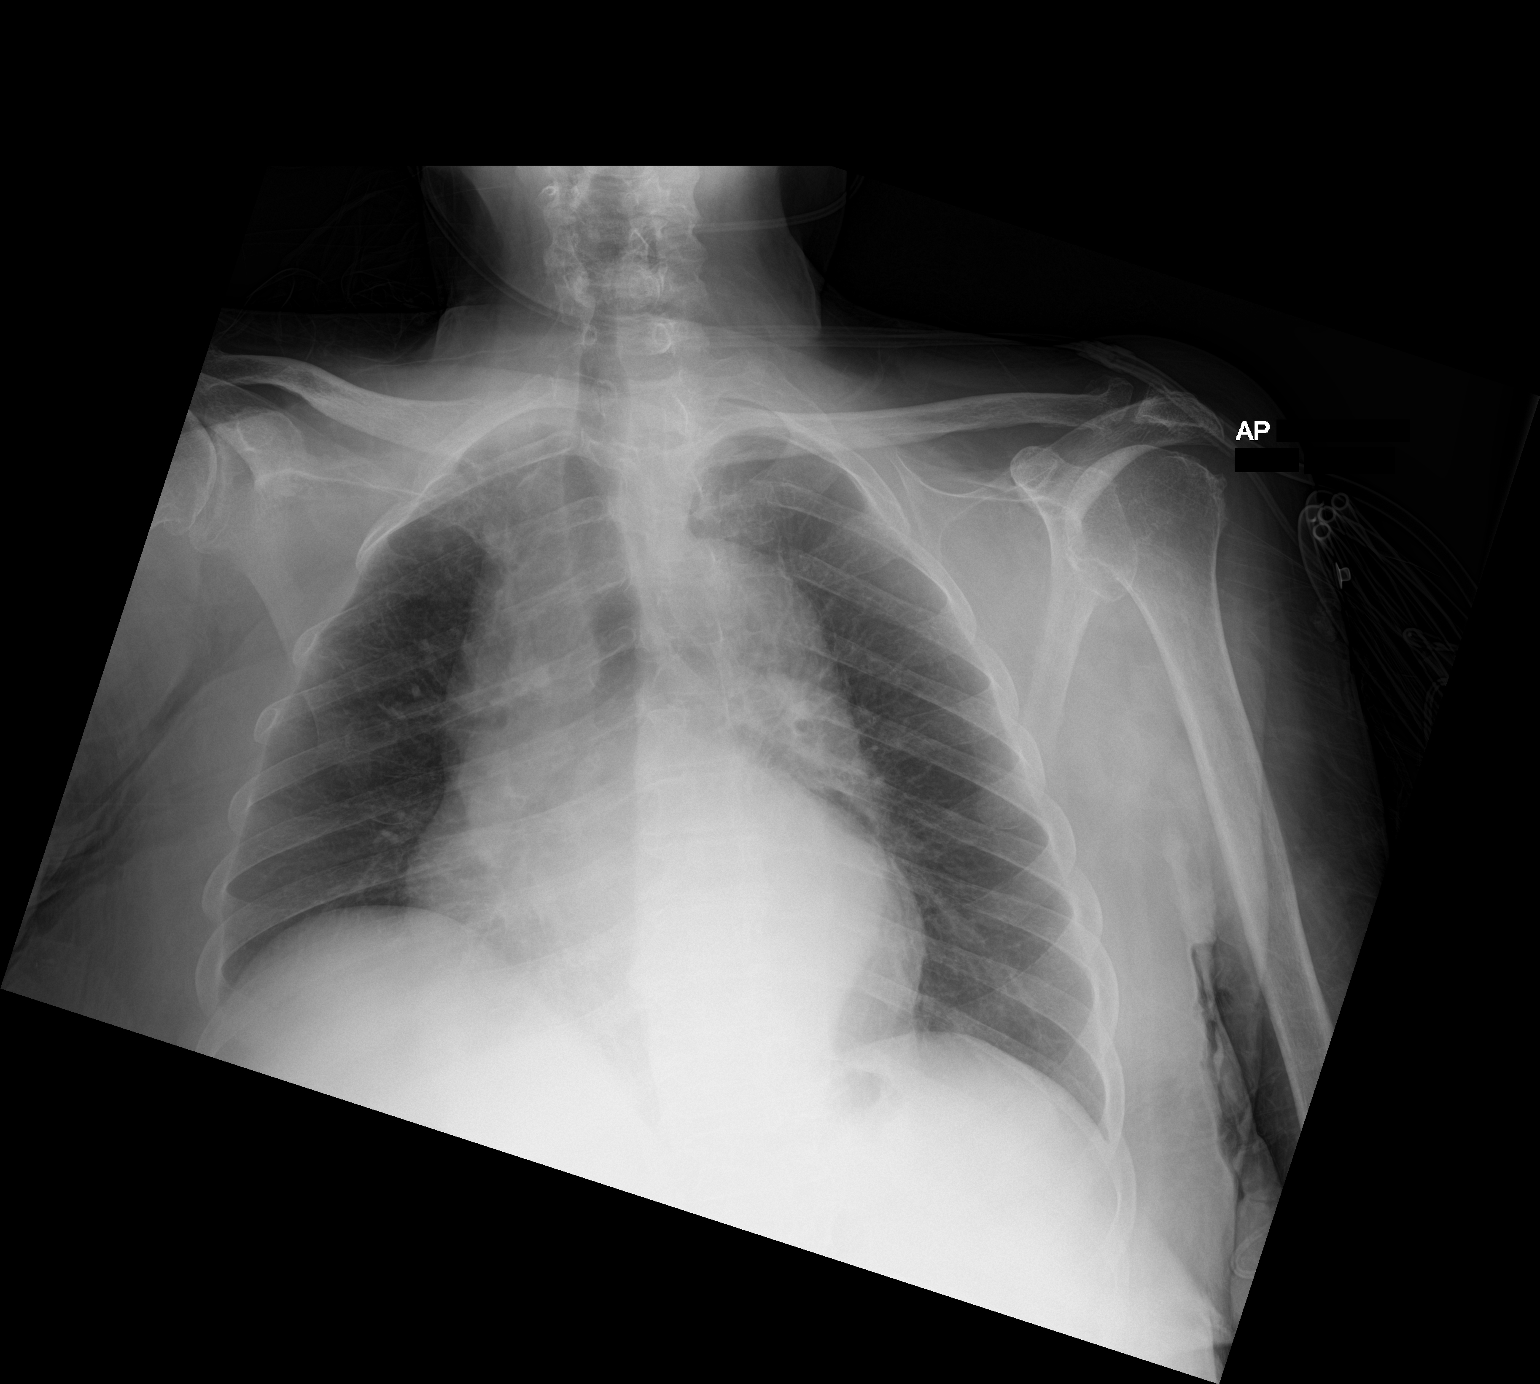

[1 of 1 positions shown; findings below may reference images not displayed]

FINDINGS: The patient is rotated to the right. Stable cardiomegaly. Normal
pulmonary vascularity. No focal consolidation, pleural effusion, or
pneumothorax. No acute osseous abnormality.
IMPRESSION: No active disease.

## 2023-01-22 IMAGING — CT CT HEAD W/O CM
3 series · 15 of 47 positions shown, 18 images · non-contrast
Comparison: CT head dated June 17, 2021.

CLINICAL DATA: Altered mental status.

EXAM:
CT HEAD WITHOUT CONTRAST
TECHNIQUE: Contiguous axial images were obtained from the base of the skull
through the vertex without intravenous contrast.

[Series 2: head wo · axial · 0.47mm/px · z∈[-137,-12]mm · 9 of 30 slices shown, 12 images]
[im 3/30  brain]
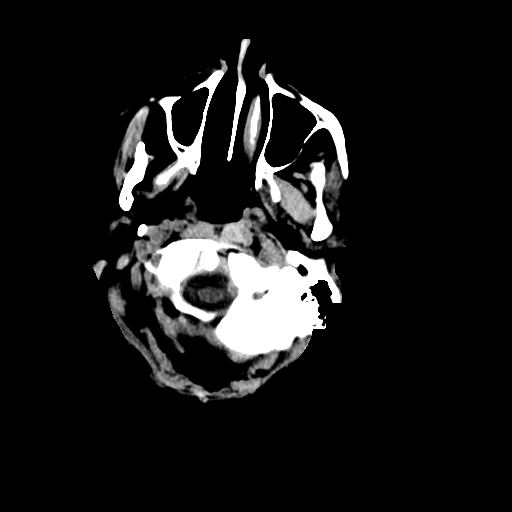
[im 3/30  bone]
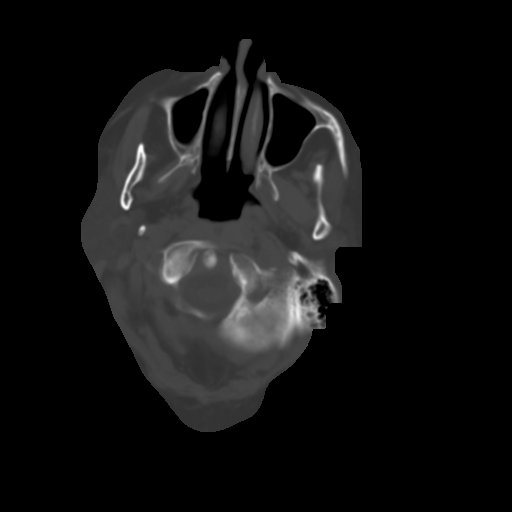
[im 6/30  brain]
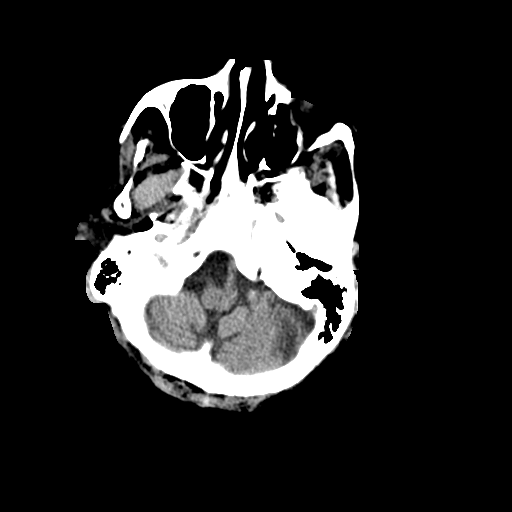
[im 9/30  brain]
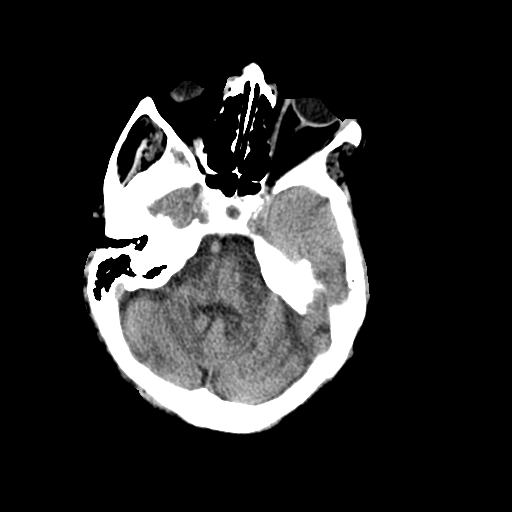
[im 12/30  brain]
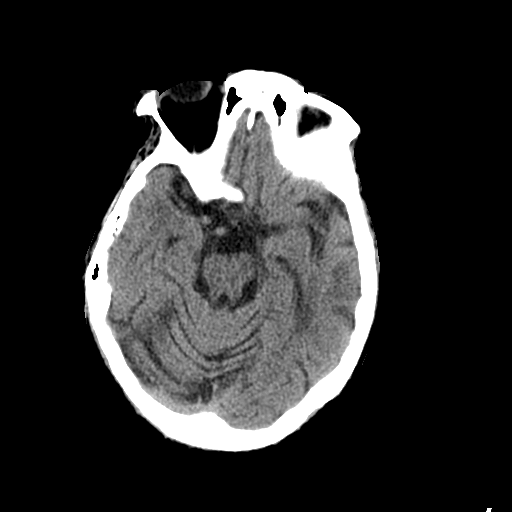
[im 16/30  brain]
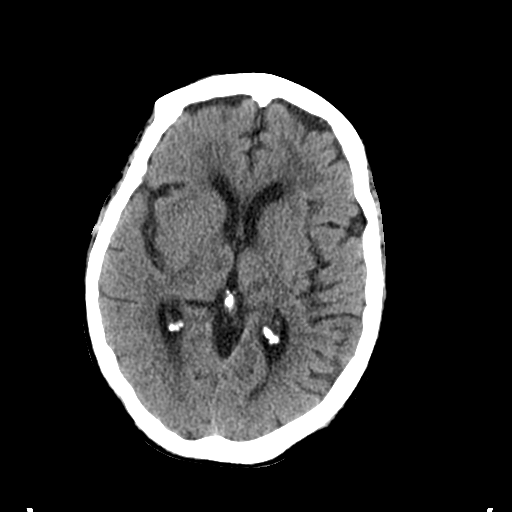
[im 16/30  bone]
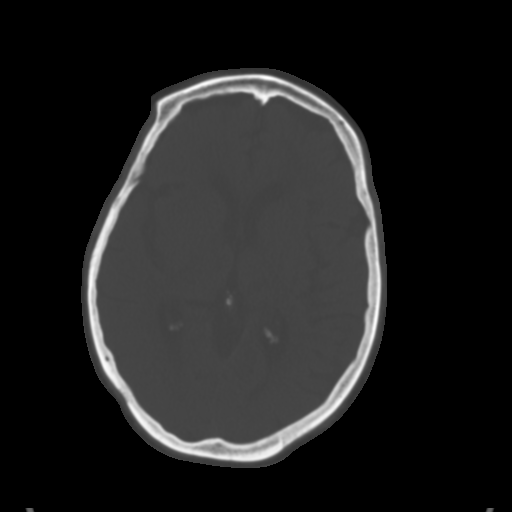
[im 19/30  brain]
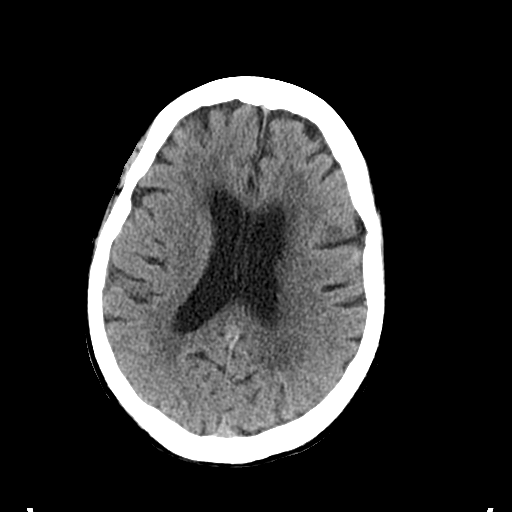
[im 22/30  brain]
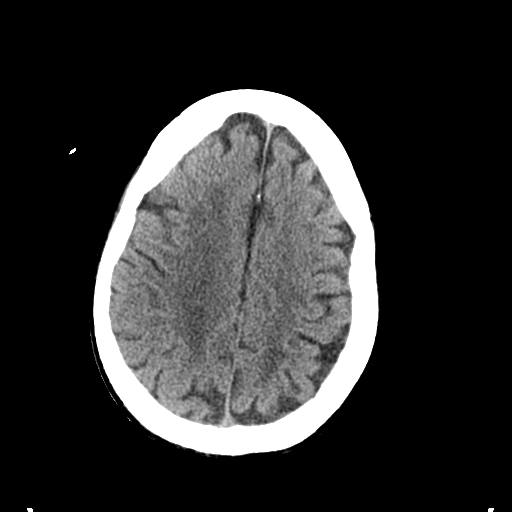
[im 25/30  brain]
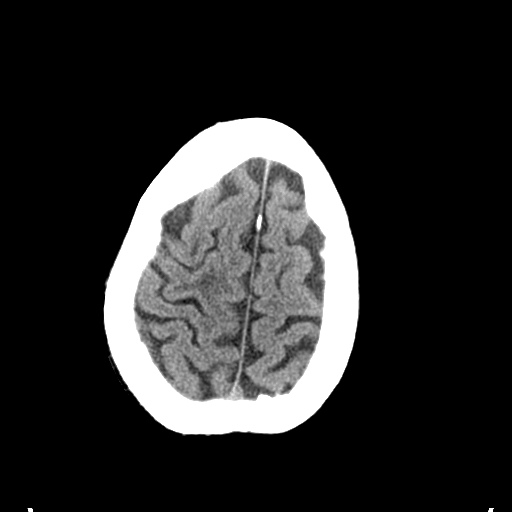
[im 28/30  brain]
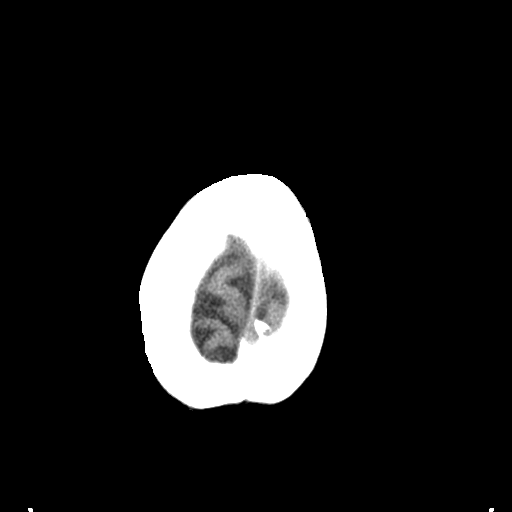
[im 28/30  bone]
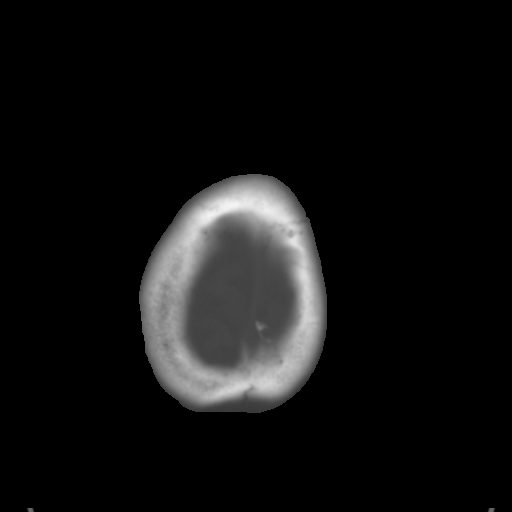

[Series 5: coronal soft tissue · coronal · 0.29mm/px · 3 of 72 slices shown]
[im 24/72  brain]
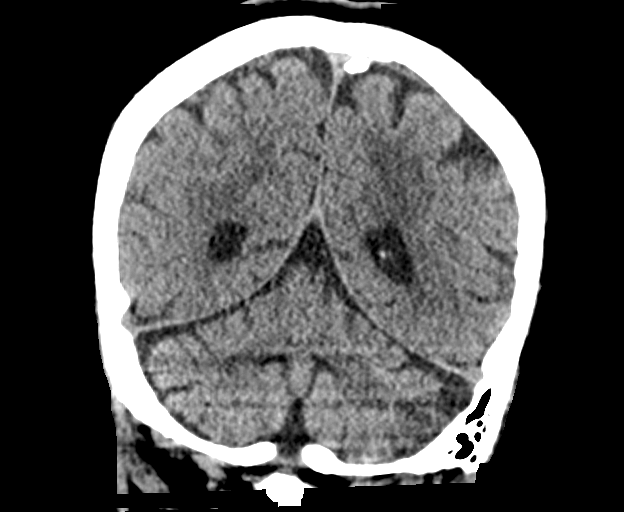
[im 32/72  brain]
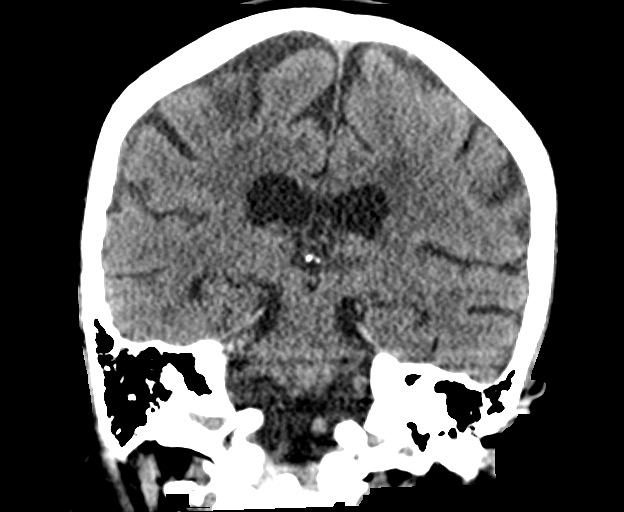
[im 40/72  brain]
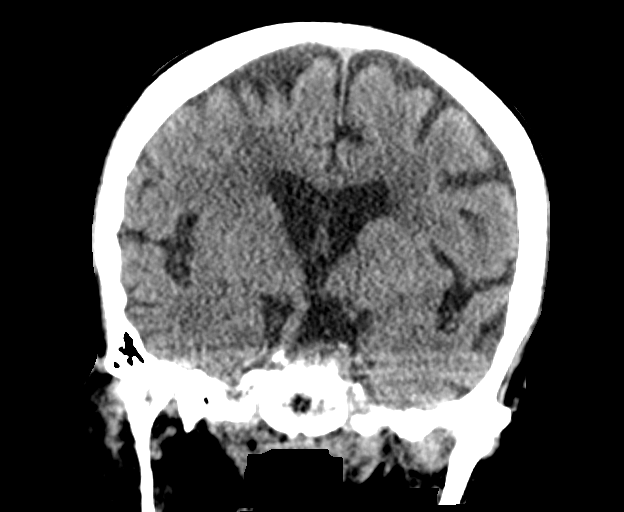

[Series 6: sagittal soft tissue · sagittal · 0.33mm/px · 3 of 61 slices shown]
[im 21/61  brain]
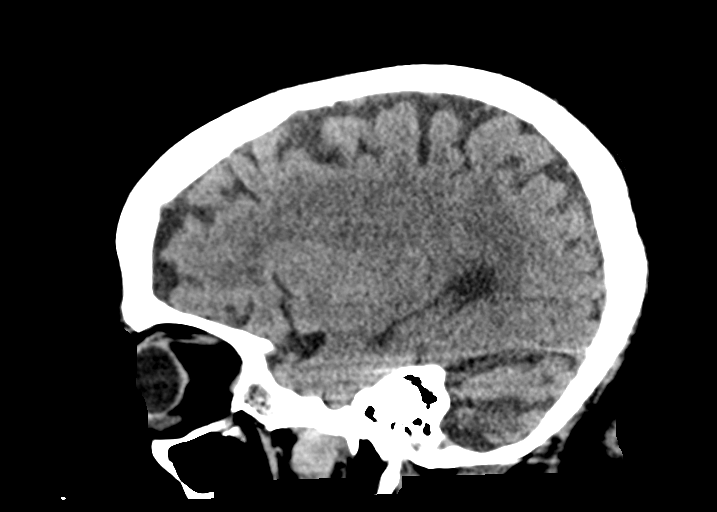
[im 31/61  brain]
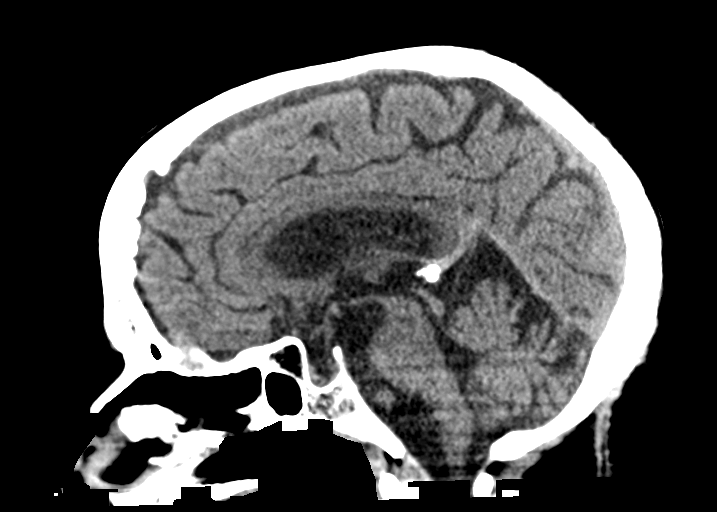
[im 41/61  brain]
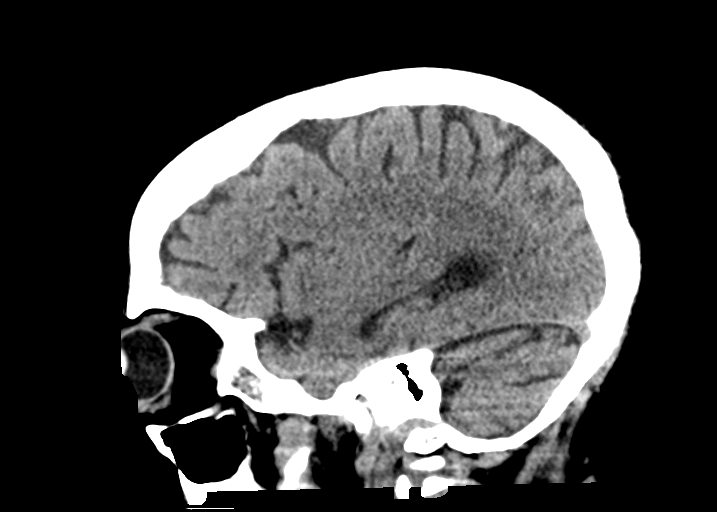

[15 of 47 positions shown; findings below may reference images not displayed]

FINDINGS: Brain: No evidence of acute infarction, hemorrhage, hydrocephalus,
extra-axial collection or mass lesion/mass effect. Stable atrophy
and chronic microvascular ischemic changes. Old infarcts in the left
caudate head, left thalamus, and left cerebellum again noted.

Vascular: Atherosclerotic vascular calcification of the carotid
siphons. No hyperdense vessel.

Skull: Normal. Negative for fracture or focal lesion.

Sinuses/Orbits: No acute finding.

Other: None.
IMPRESSION: 1. No acute intracranial abnormality.
2. Stable atrophy, chronic microvascular ischemic changes, and old
infarcts.

## 2023-01-27 ENCOUNTER — Telehealth: Payer: Self-pay | Admitting: Family Medicine

## 2023-01-27 DIAGNOSIS — M6281 Muscle weakness (generalized): Secondary | ICD-10-CM | POA: Diagnosis not present

## 2023-01-27 NOTE — Telephone Encounter (Signed)
[employer] form dropped off by [place]. Call [facility] at [place] [phone] when forms are completed  Copied Noted [place]

## 2023-01-28 ENCOUNTER — Other Ambulatory Visit: Payer: Self-pay | Admitting: Family Medicine

## 2023-01-28 DIAGNOSIS — M6281 Muscle weakness (generalized): Secondary | ICD-10-CM | POA: Diagnosis not present

## 2023-01-28 IMAGING — CT CT HEAD W/O CM
4 series · 16 of 47 positions shown, 18 images · non-contrast
Comparison: CT head and cervical spine 06/08/2021

CLINICAL DATA: Status post fall.  Dementia.  Intoxicated.

EXAM:
CT HEAD WITHOUT CONTRAST
CT CERVICAL SPINE WITHOUT CONTRAST
TECHNIQUE: Multidetector CT imaging of the head and cervical spine was
performed following the standard protocol without intravenous
contrast. Multiplanar CT image reconstructions of the cervical spine
were also generated.

[Series 3: head wo · axial · 0.39mm/px · z∈[+1173,+1288]mm · 7 of 31 slices shown, 9 images]
[im 4/31  brain]
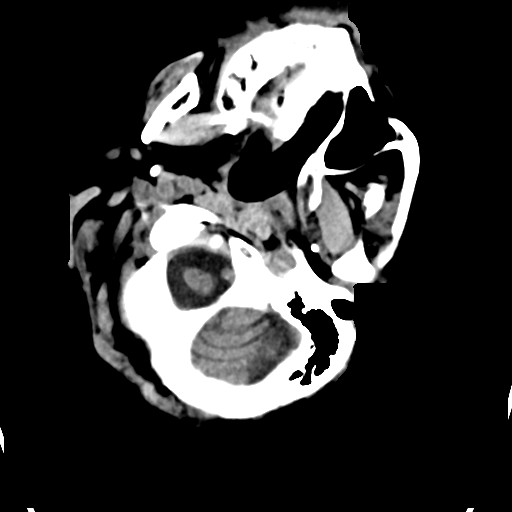
[im 4/31  bone]
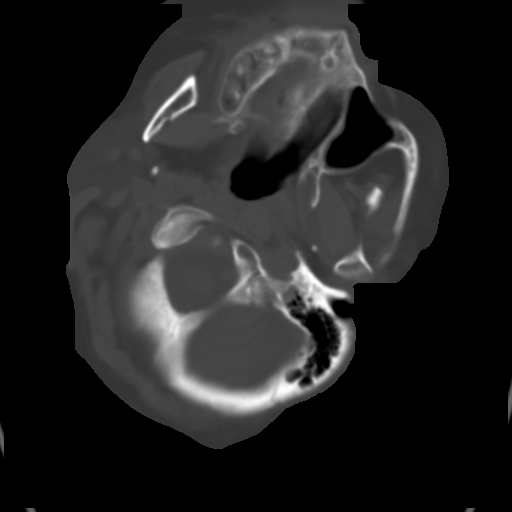
[im 8/31  brain]
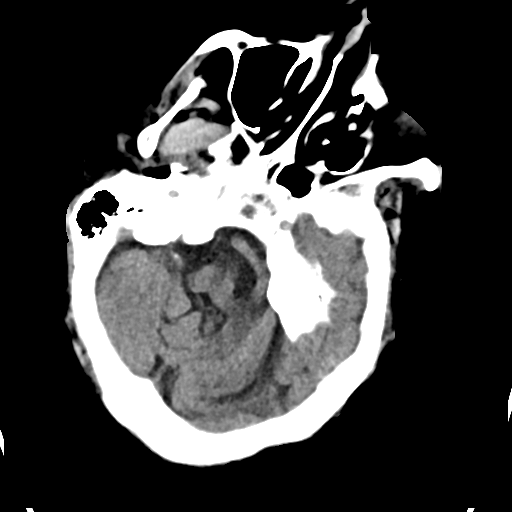
[im 12/31  brain]
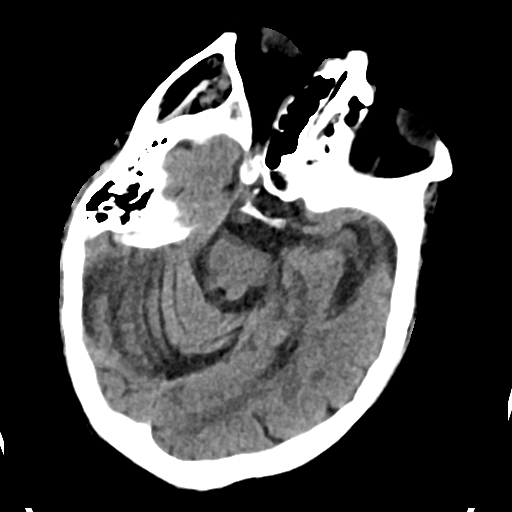
[im 16/31  brain]
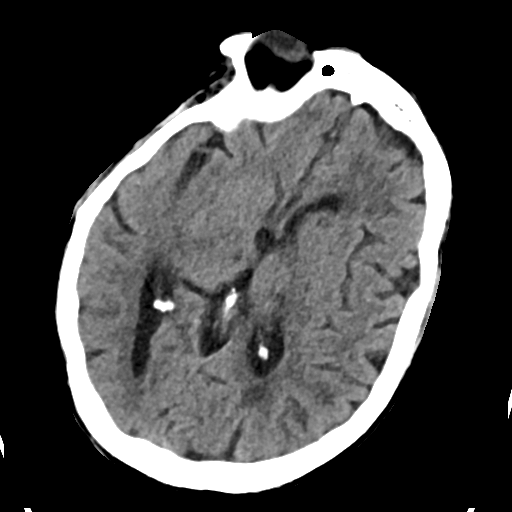
[im 19/31  brain]
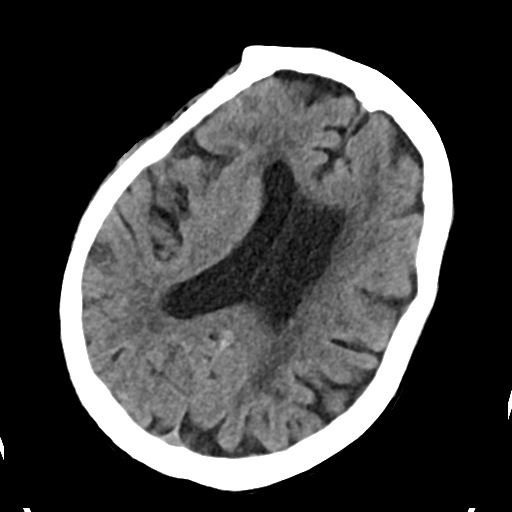
[im 19/31  bone]
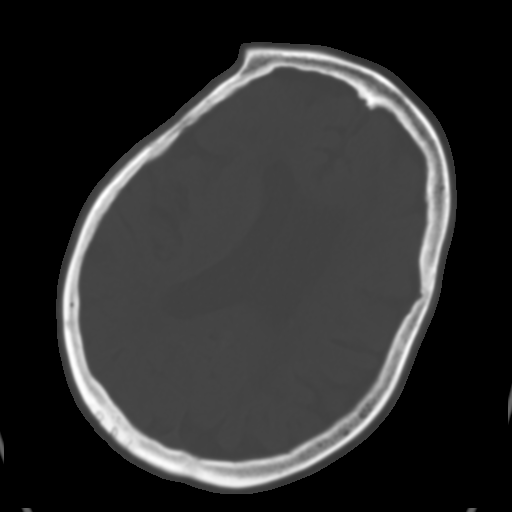
[im 23/31  brain]
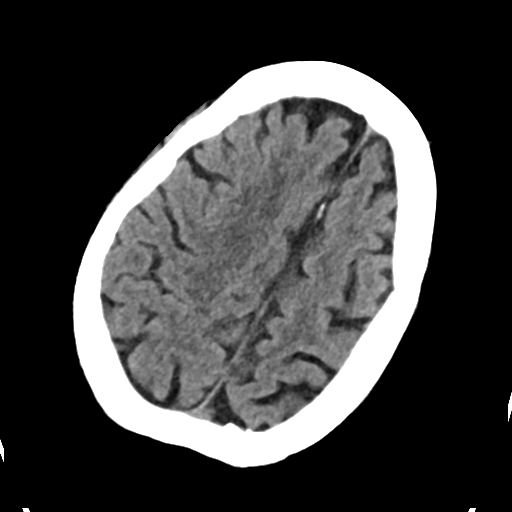
[im 27/31  brain]
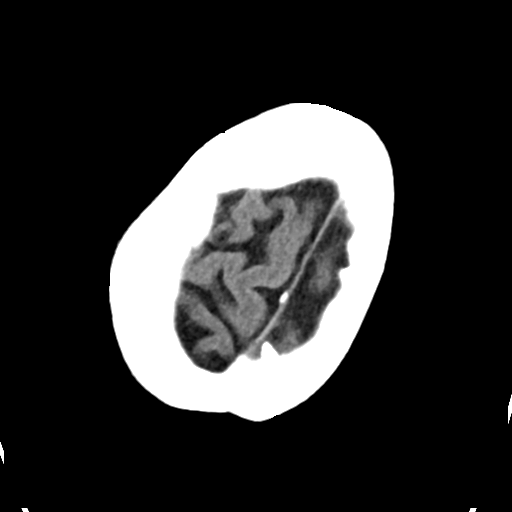

[Series 4: head bone · axial · 0.39mm/px · z∈[+1172,+1202]mm · 3 of 76 slices shown]
[im 8/76  bone]
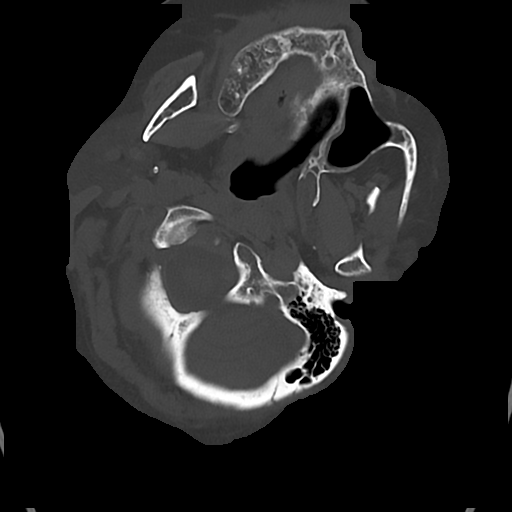
[im 16/76  bone]
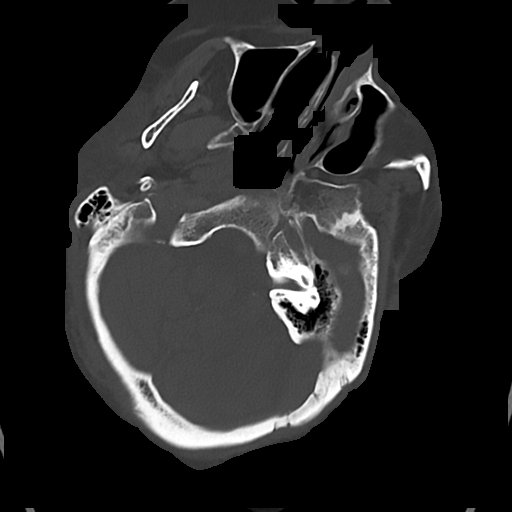
[im 23/76  bone]
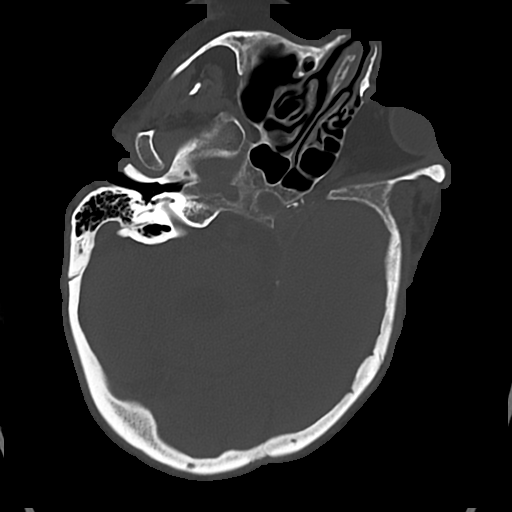

[Series 5: cor soft · coronal · 0.32mm/px · 3 of 69 slices shown]
[im 23/69  brain]
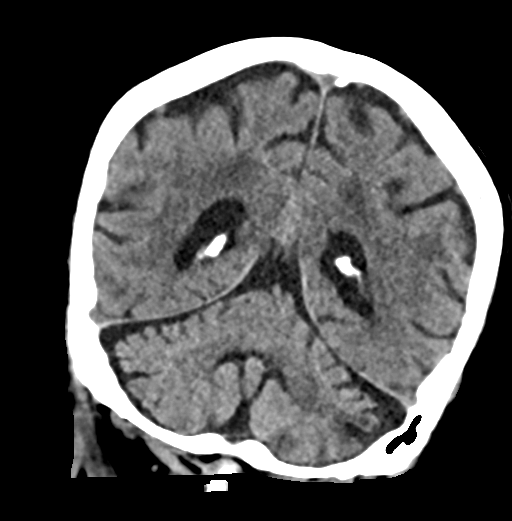
[im 31/69  brain]
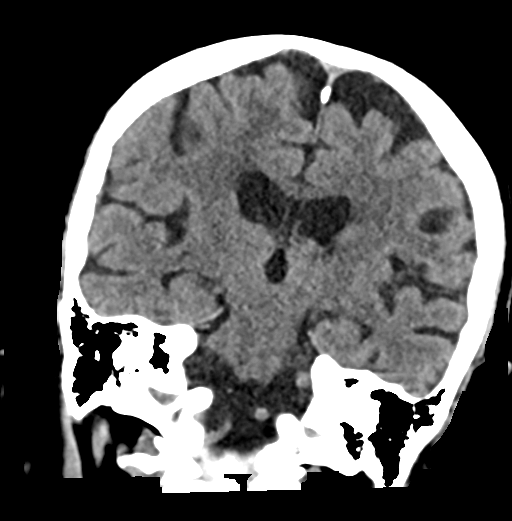
[im 38/69  brain]
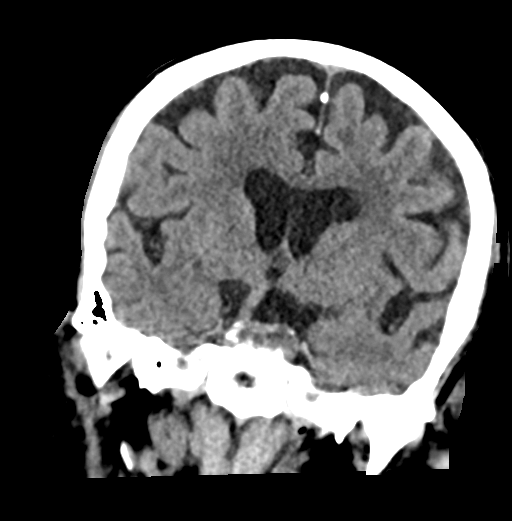

[Series 6: sag soft · sagittal · 0.36mm/px · 3 of 57 slices shown]
[im 19/57  brain]
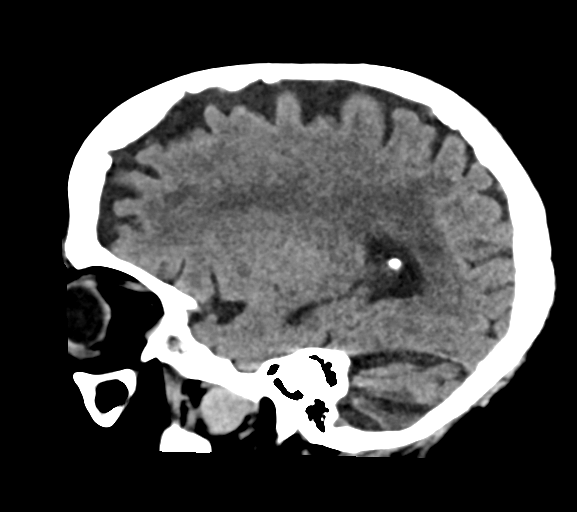
[im 29/57  brain]
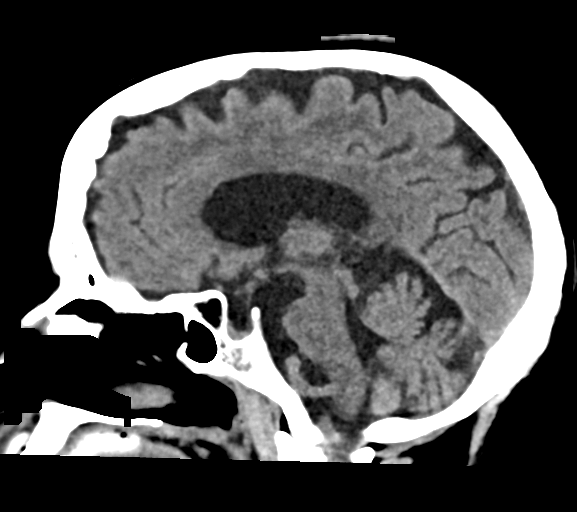
[im 38/57  brain]
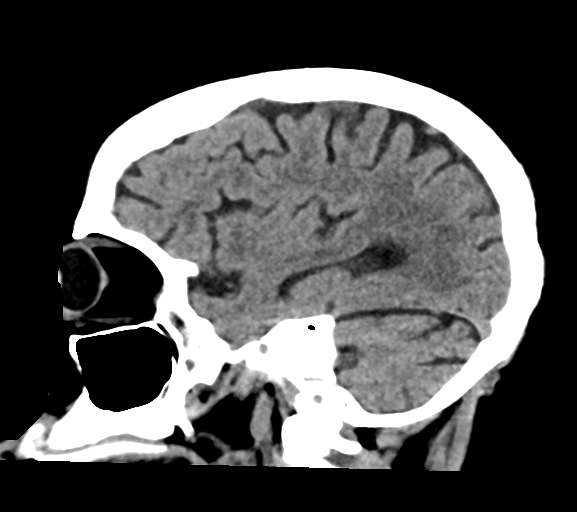

[16 of 47 positions shown; findings below may reference images not displayed]

FINDINGS: CT HEAD FINDINGS

BRAIN:
BRAIN
Cerebral ventricle sizes are concordant with the degree of cerebral
volume loss. Patchy and confluent areas of decreased attenuation are
noted throughout the deep and periventricular white matter of the
cerebral hemispheres bilaterally, compatible with chronic
microvascular ischemic disease. Chronic left cerebellar infarction.

No evidence of large-territorial acute infarction. No parenchymal
hemorrhage. No mass lesion. No extra-axial collection.

No mass effect or midline shift. No hydrocephalus. Basilar cisterns
are patent.

Vascular: No hyperdense vessel.

Skull: No acute fracture or focal lesion.

Sinuses/Orbits: Paranasal sinuses and mastoid air cells are clear.
The orbits are unremarkable.

Other: None.

CT CERVICAL SPINE FINDINGS

Alignment: Normal.

Skull base and vertebrae: No acute fracture. No aggressive appearing
focal osseous lesion or focal pathologic process.

Soft tissues and spinal canal: No prevertebral fluid or swelling. No
visible canal hematoma.

Upper chest: Patchy ground-glass airspace opacities may be due to
atelectasis/mosaic attenuation versus infection/inflammation.

Other: Bilateral shoulder degenerative changes.
IMPRESSION: 1. No acute intracranial abnormality.
2. No acute displaced fracture or traumatic listhesis of the
cervical spine.
3. Patchy ground-glass airspace opacities may be due to
atelectasis/mosaic attenuation versus infection/inflammation.
Recommend chest x-ray for further evaluation.

## 2023-01-28 IMAGING — DX DG LUMBAR SPINE COMPLETE 4+V
5 series · 5 of 5 positions shown · non-contrast
Comparison: X-ray lumbar spine 06/21/2017, CT abdomen pelvis
04/01/2021

CLINICAL DATA: Status post fall

EXAM:
LUMBAR SPINE - COMPLETE 4+ VIEW

[l-spine obl (1 of 2)]
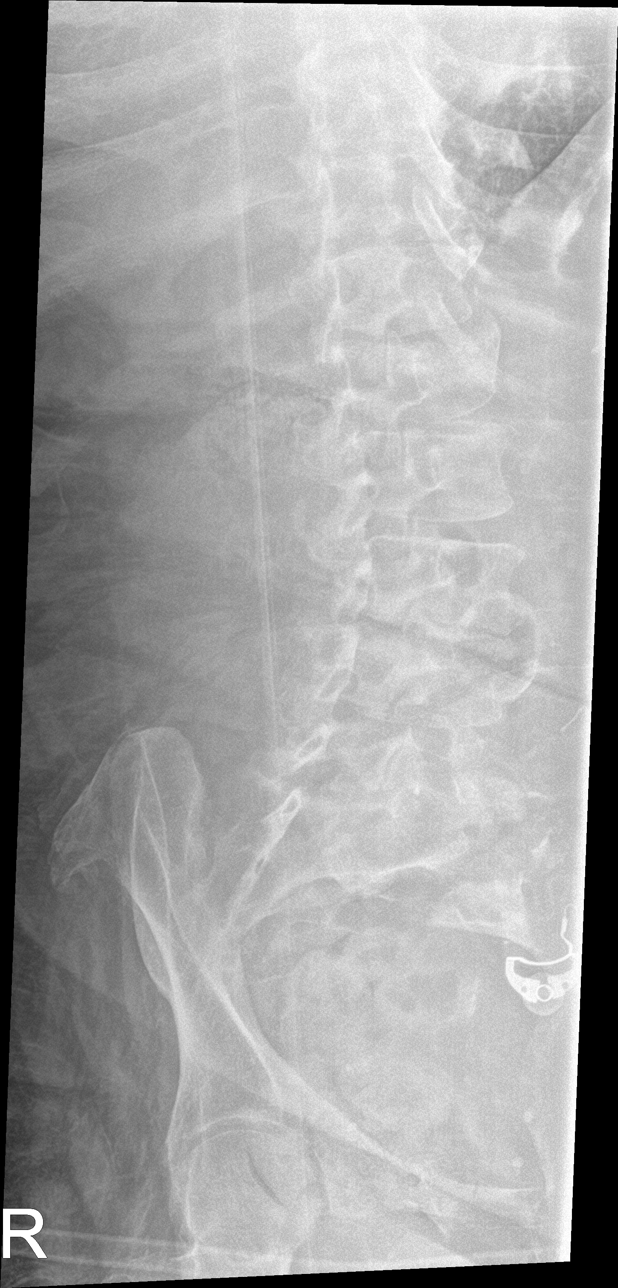

[l-spine obl (2 of 2)]
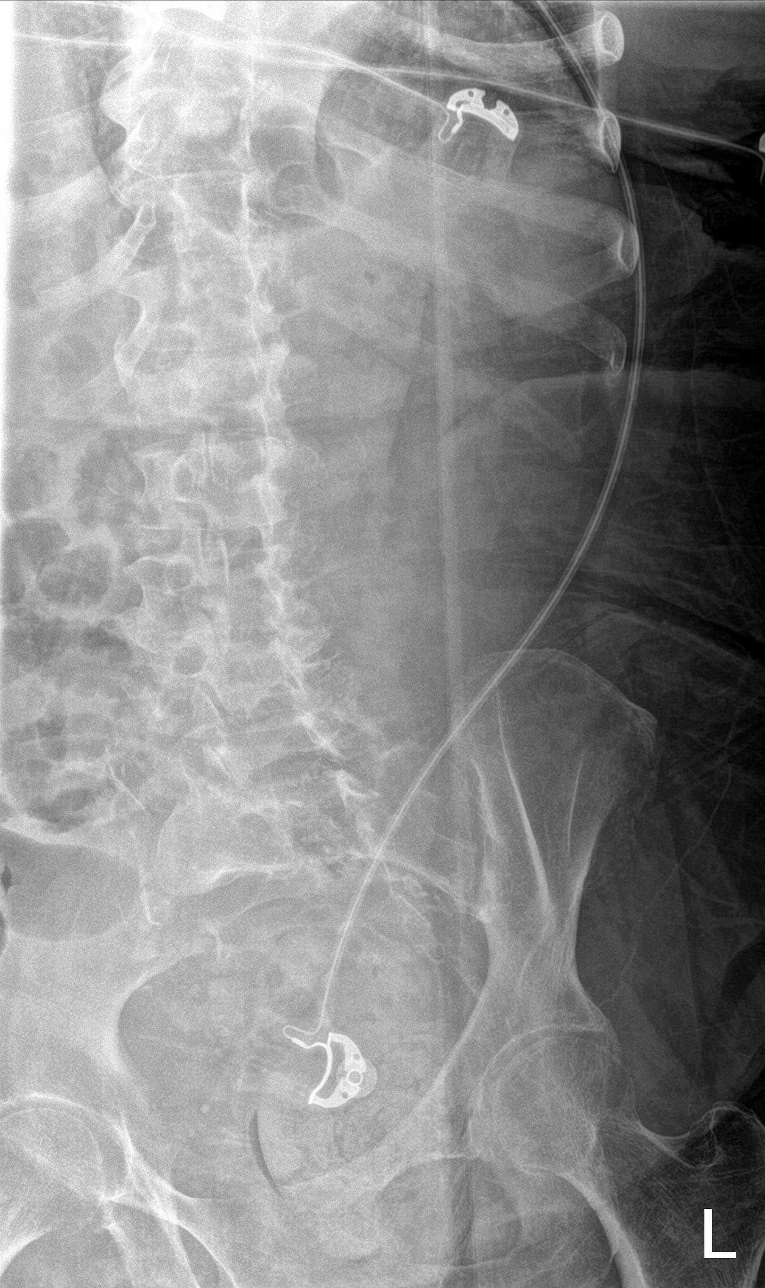

[l-spine lat]
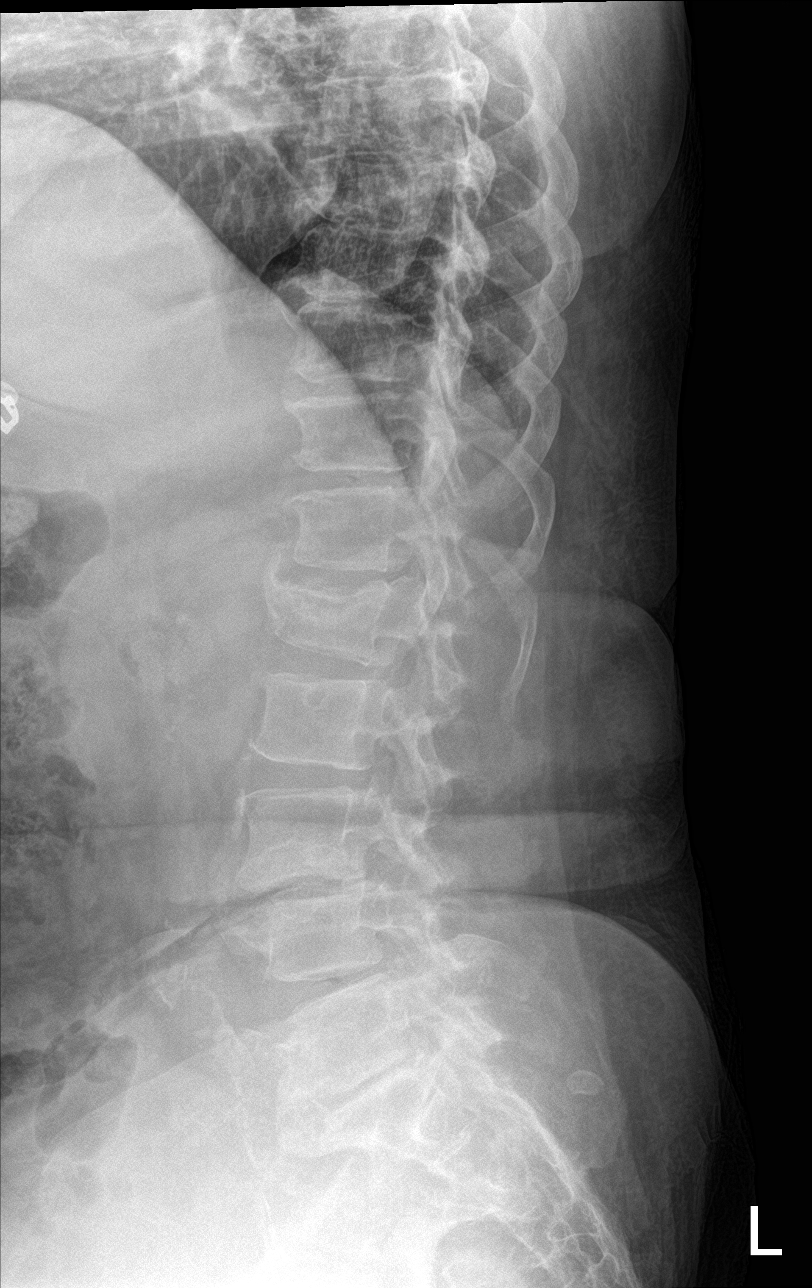

[l-spine ap]
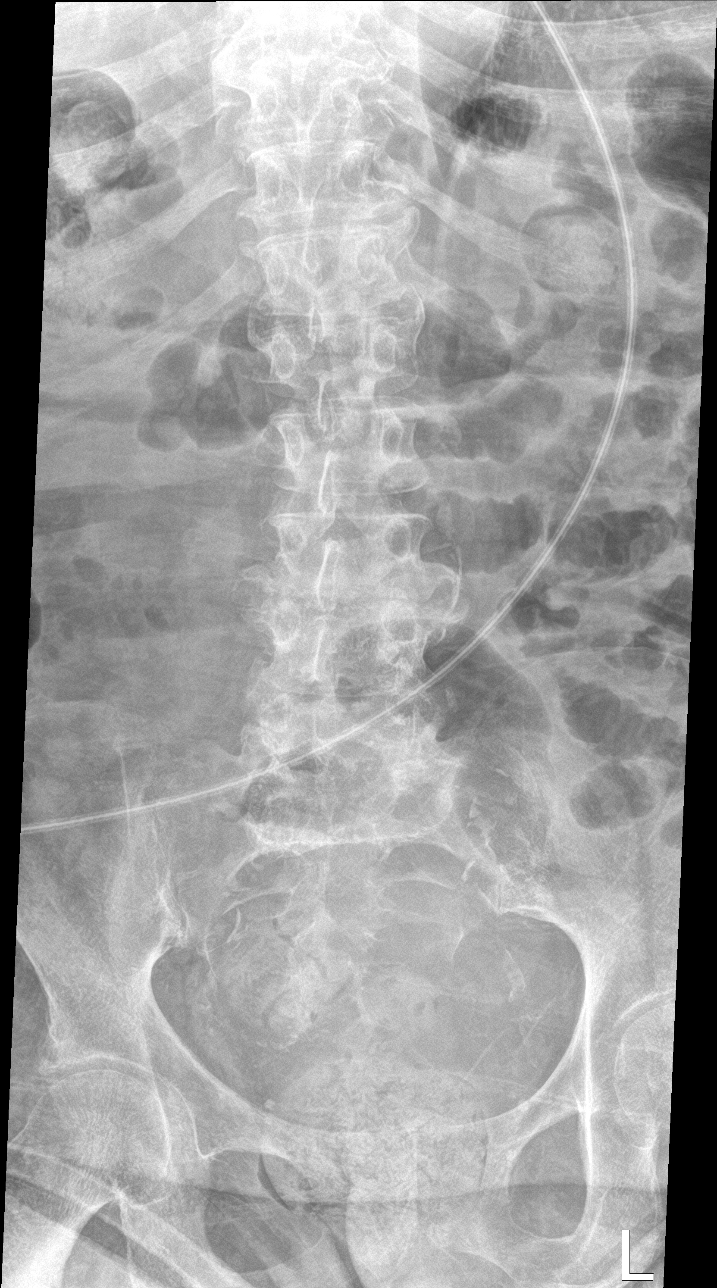

[l-spine spot]
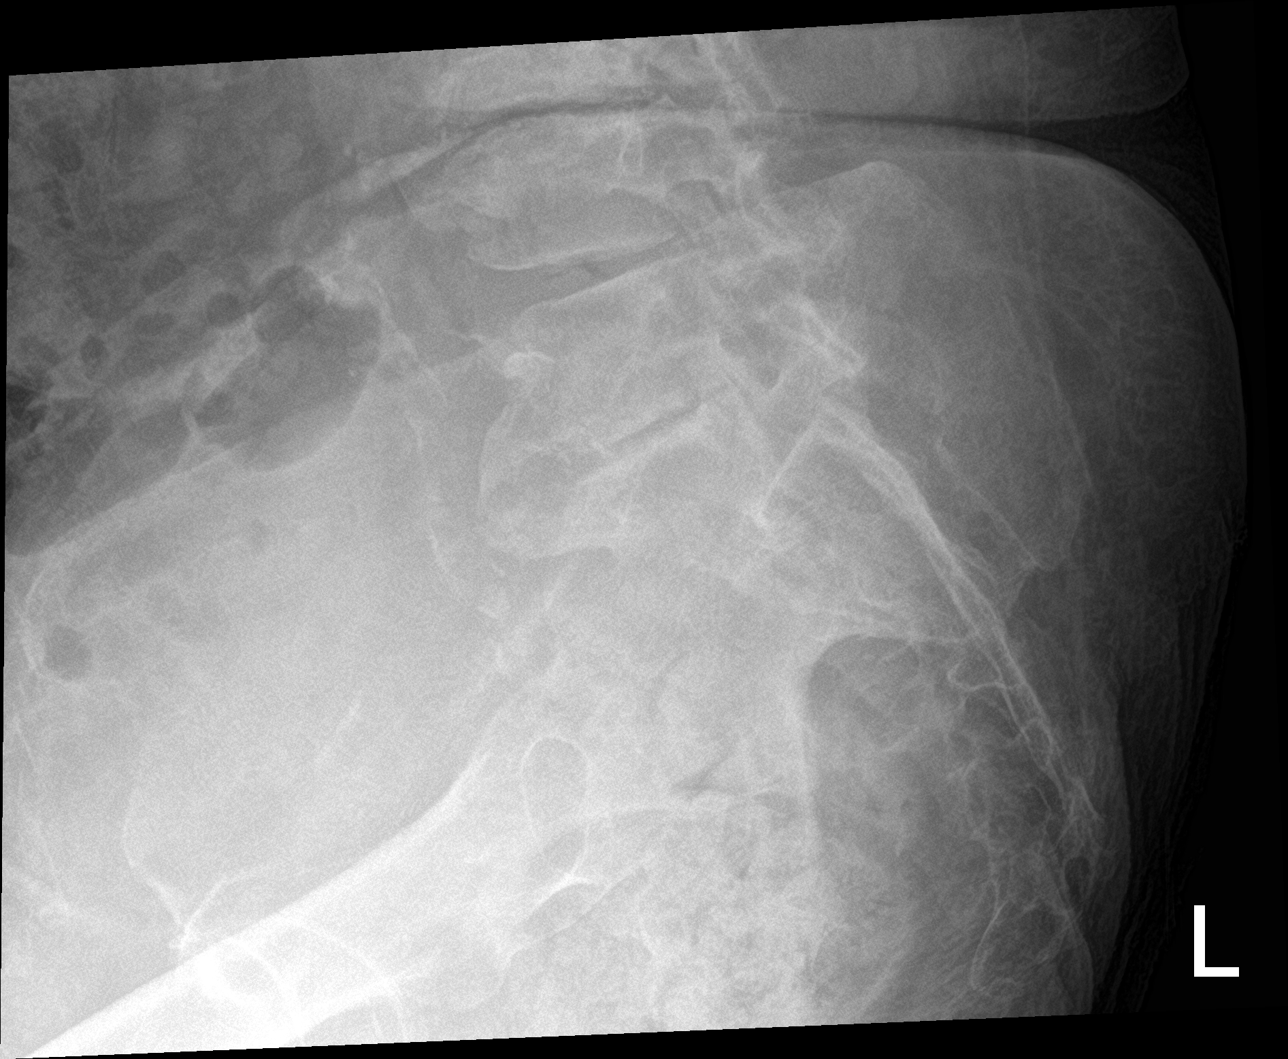

[5 of 5 positions shown; findings below may reference images not displayed]

FINDINGS: Five rib-bearing lumbar vertebral bodies. Multilevel degenerative
changes of the spine. Redemonstration of a L1 anterior wedge
compression fracture with at least 30% height loss. No definite
acute displaced lumbar spine fracture. Similar-appearing grade 1
anterolisthesis of L3 on L4. Markedly limited evaluation due to
overlapping osseous structures and overlying soft tissues.

Atherosclerotic plaque.
IMPRESSION: 1. No acute displaced fracture or traumatic listhesis of the lumbar
spine in a patient with a chronic L1 anterior wedge compression
fracture and stable grade 1 anterolisthesis of L3 on L4. Please note
limited evaluation due to overlapping osseous structures and
overlying soft tissues.
2.  Aortic Atherosclerosis ([id number]).

## 2023-01-28 IMAGING — CT CT CERVICAL SPINE W/O CM
4 series · 15 of 35 positions shown, 17 images · non-contrast
Comparison: CT head and cervical spine 06/08/2021

CLINICAL DATA: Status post fall.  Dementia.  Intoxicated.

EXAM:
CT HEAD WITHOUT CONTRAST
CT CERVICAL SPINE WITHOUT CONTRAST
TECHNIQUE: Multidetector CT imaging of the head and cervical spine was
performed following the standard protocol without intravenous
contrast. Multiplanar CT image reconstructions of the cervical spine
were also generated.

[Series 4: c spine soft · axial · 0.34mm/px · z∈[+1050,+1102]mm · 3 of 90 slices shown]
[im 13/90  soft-tissue]
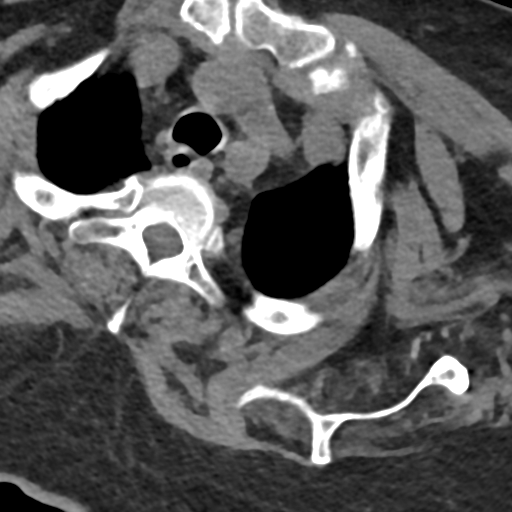
[im 26/90  soft-tissue]
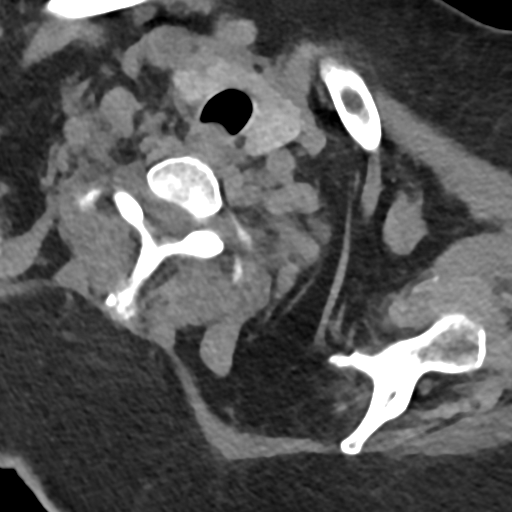
[im 39/90  soft-tissue]
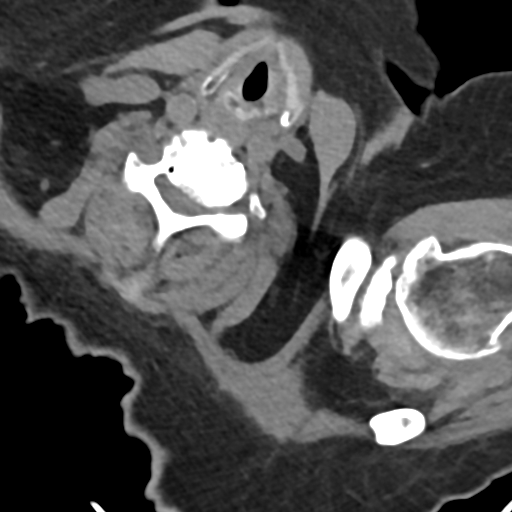

[Series 8: orthogonal axials · axial · 0.21mm/px · z∈[+1103,+1157]mm · 4 of 73 slices shown, 5 images]
[im 15/73  soft-tissue]
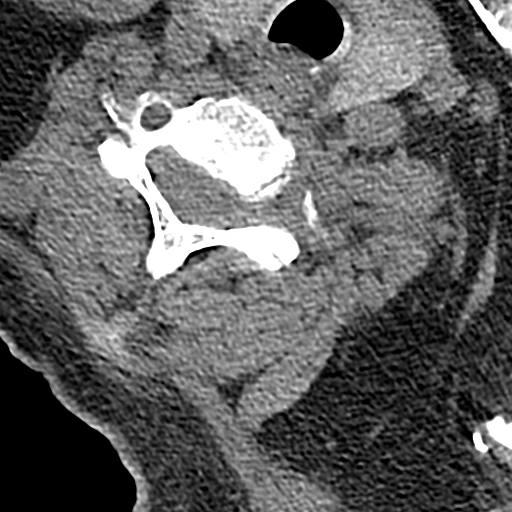
[im 15/73  bone]
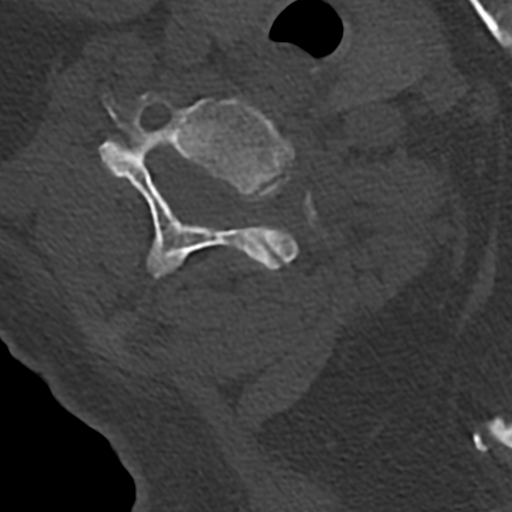
[im 29/73  bone]
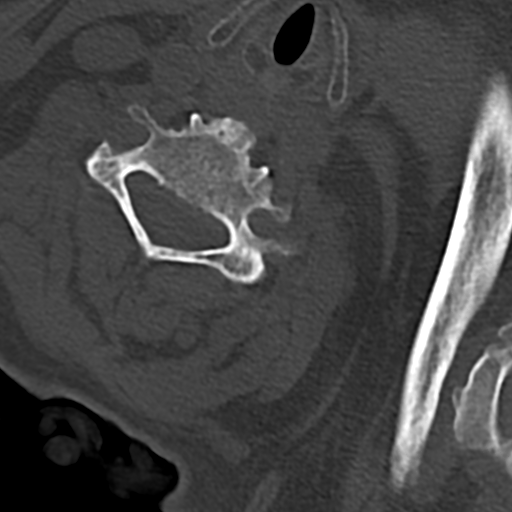
[im 44/73  bone]
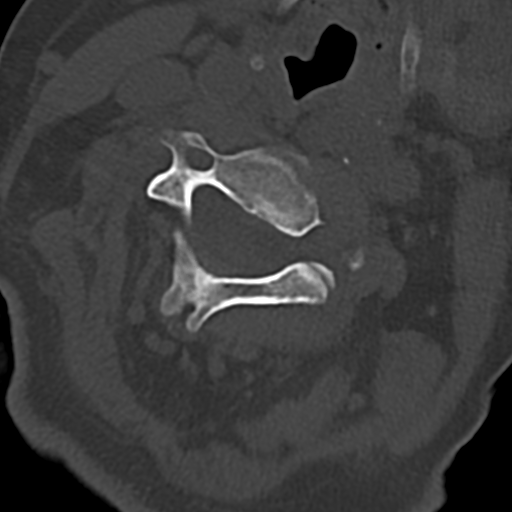
[im 58/73  bone]
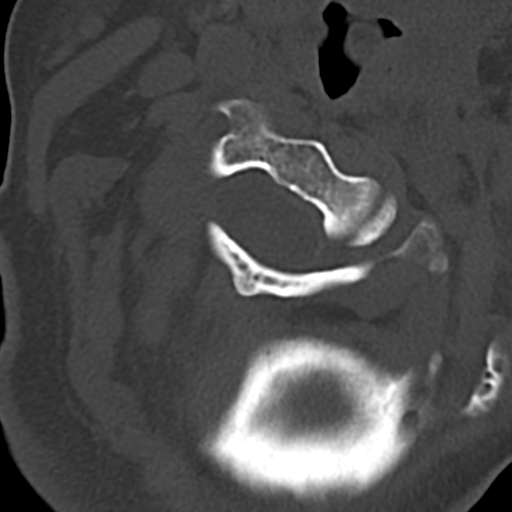

[Series 9: sag bone · sagittal · 0.36mm/px · 5 of 143 slices shown, 6 images]
[im 48/143  bone]
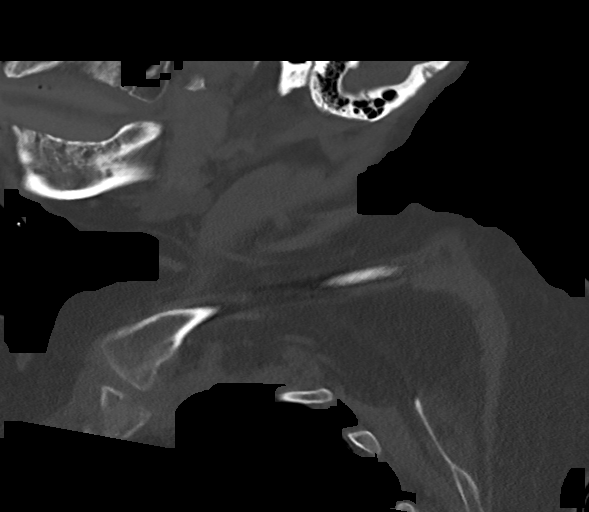
[im 60/143  bone]
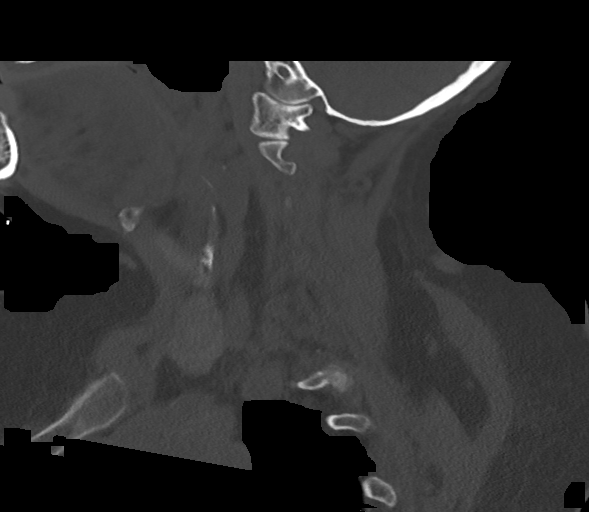
[im 72/143  soft-tissue]
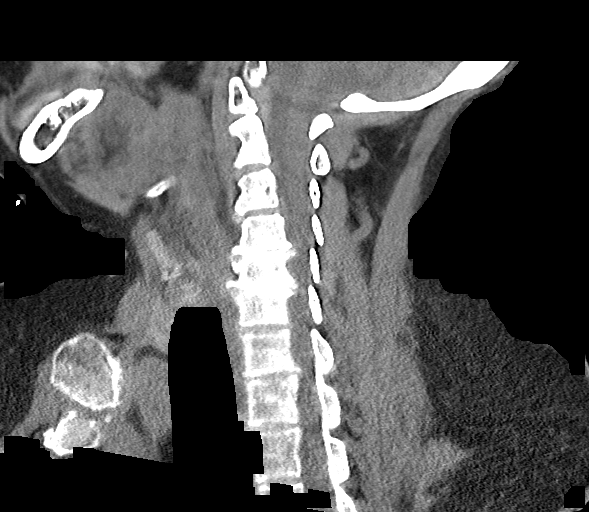
[im 72/143  bone]
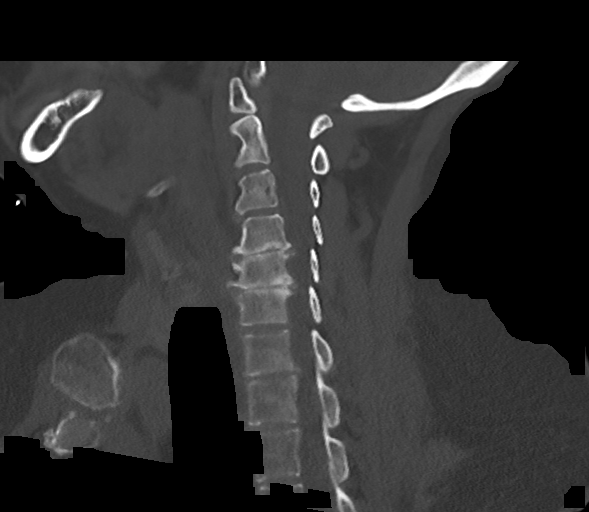
[im 83/143  bone]
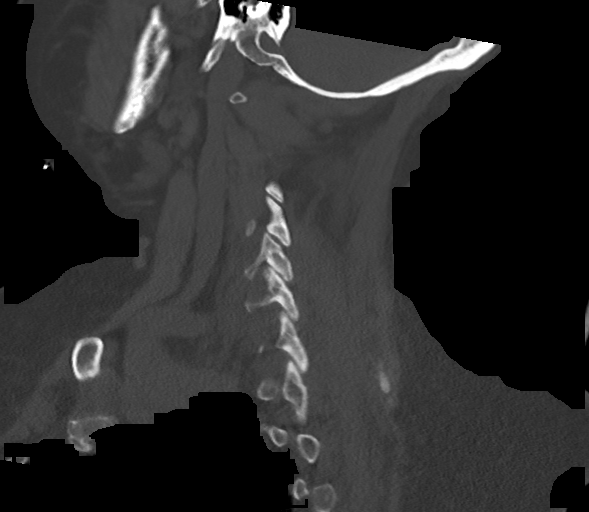
[im 95/143  bone]
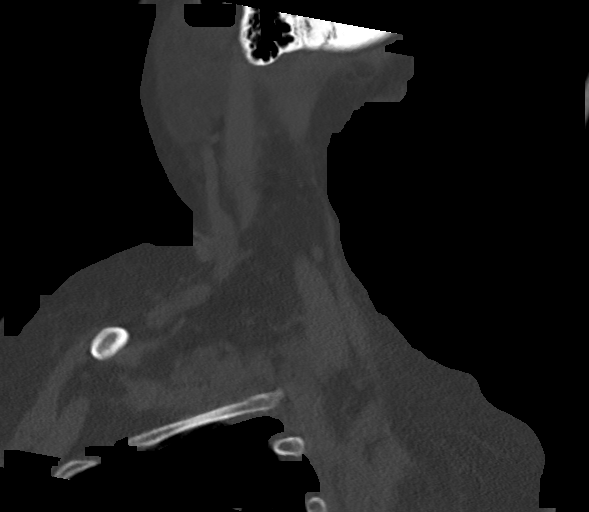

[Series 10: cor bone · coronal · 0.39mm/px · 3 of 104 slices shown]
[im 21/104  bone]
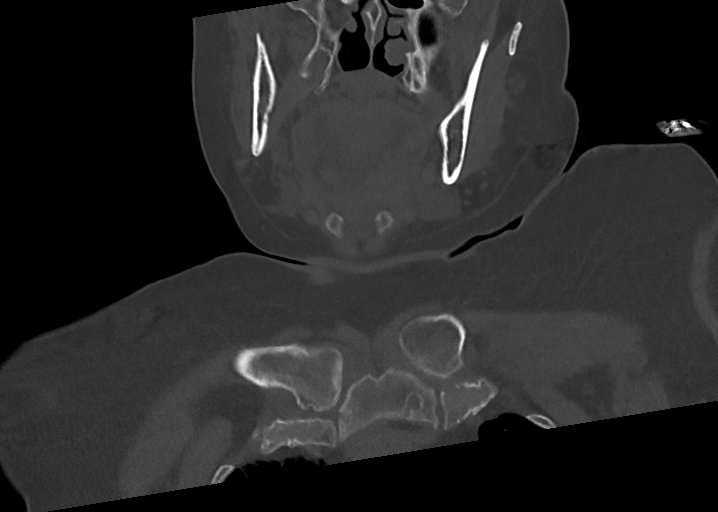
[im 42/104  bone]
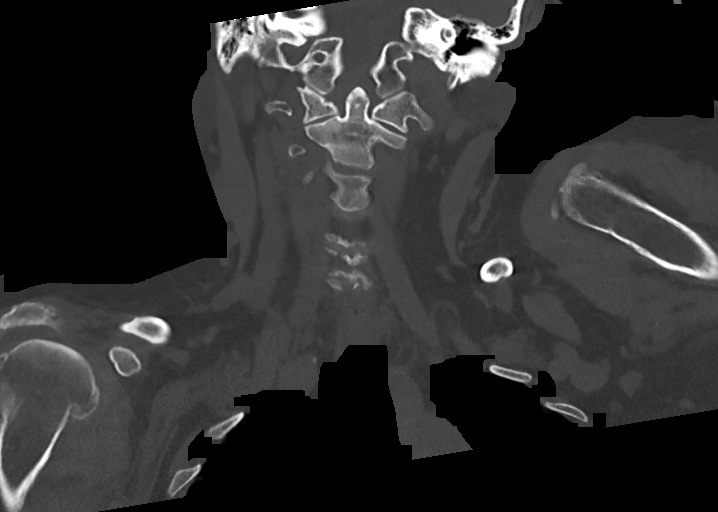
[im 62/104  bone]
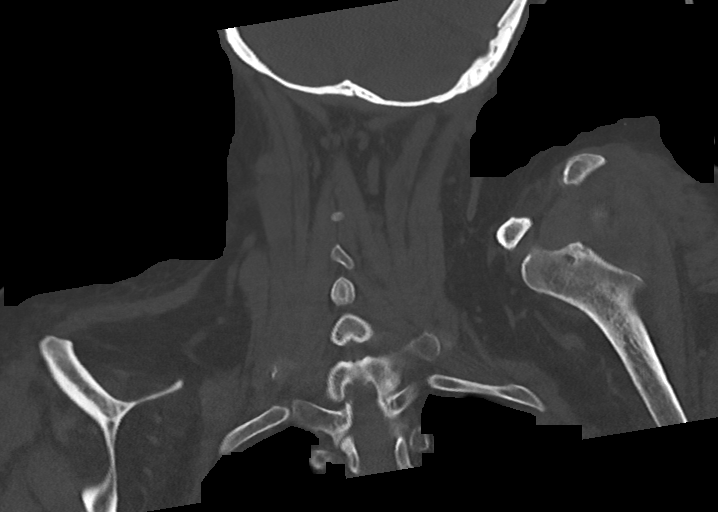

[15 of 35 positions shown; findings below may reference images not displayed]

FINDINGS: CT HEAD FINDINGS

BRAIN:
BRAIN
Cerebral ventricle sizes are concordant with the degree of cerebral
volume loss. Patchy and confluent areas of decreased attenuation are
noted throughout the deep and periventricular white matter of the
cerebral hemispheres bilaterally, compatible with chronic
microvascular ischemic disease. Chronic left cerebellar infarction.

No evidence of large-territorial acute infarction. No parenchymal
hemorrhage. No mass lesion. No extra-axial collection.

No mass effect or midline shift. No hydrocephalus. Basilar cisterns
are patent.

Vascular: No hyperdense vessel.

Skull: No acute fracture or focal lesion.

Sinuses/Orbits: Paranasal sinuses and mastoid air cells are clear.
The orbits are unremarkable.

Other: None.

CT CERVICAL SPINE FINDINGS

Alignment: Normal.

Skull base and vertebrae: No acute fracture. No aggressive appearing
focal osseous lesion or focal pathologic process.

Soft tissues and spinal canal: No prevertebral fluid or swelling. No
visible canal hematoma.

Upper chest: Patchy ground-glass airspace opacities may be due to
atelectasis/mosaic attenuation versus infection/inflammation.

Other: Bilateral shoulder degenerative changes.
IMPRESSION: 1. No acute intracranial abnormality.
2. No acute displaced fracture or traumatic listhesis of the
cervical spine.
3. Patchy ground-glass airspace opacities may be due to
atelectasis/mosaic attenuation versus infection/inflammation.
Recommend chest x-ray for further evaluation.

## 2023-01-28 IMAGING — DX DG PELVIS 1-2V
1 series · 1 of 1 positions shown · non-contrast
Comparison: None.

CLINICAL DATA: Status post fall

EXAM:
PELVIS - 1-2 VIEW

[pelvis ap]
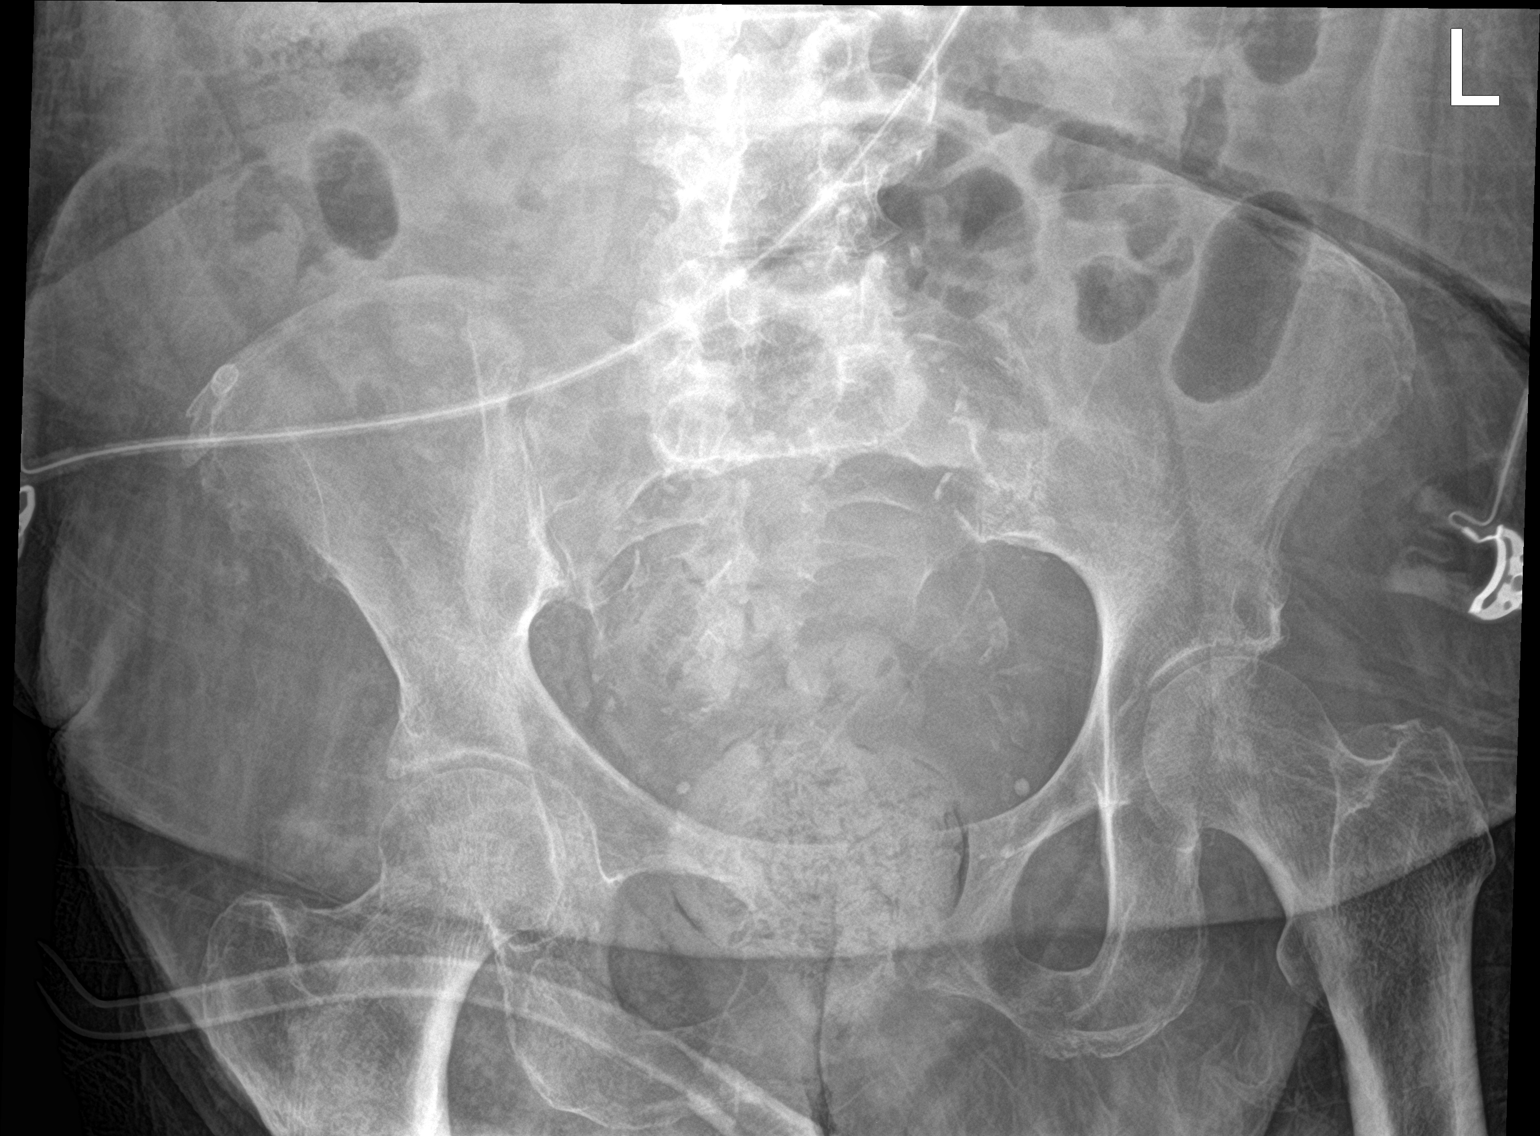

[1 of 1 positions shown; findings below may reference images not displayed]

FINDINGS: There is no evidence of pelvic fracture or diastasis. Markedly
limited evaluation of the pubic symphysis due to overlying stool
ball. Grossly unremarkable sacrum and sacroiliac joints with limited
evaluation due to overlying bowel. No pelvic bone lesions are seen.
IMPRESSION: Negative with limited evaluation due to overlapping osseous
structures and overlying soft tissues.

## 2023-01-29 DIAGNOSIS — M6281 Muscle weakness (generalized): Secondary | ICD-10-CM | POA: Diagnosis not present

## 2023-02-02 DIAGNOSIS — M6281 Muscle weakness (generalized): Secondary | ICD-10-CM | POA: Diagnosis not present

## 2023-02-03 DIAGNOSIS — M6281 Muscle weakness (generalized): Secondary | ICD-10-CM | POA: Diagnosis not present

## 2023-02-04 DIAGNOSIS — M6281 Muscle weakness (generalized): Secondary | ICD-10-CM | POA: Diagnosis not present

## 2023-02-06 IMAGING — CT CT MAXILLOFACIAL W/O CM
3 of 4 series · 16 of 47 positions shown, 19 images · non-contrast
Comparison: Head CT 07/07/2021

CLINICAL DATA: Facial trauma, swelling. Fall 07/07/2021. Rule out
mandibular zygomatic fracture.

EXAM:
CT MAXILLOFACIAL WITHOUT CONTRAST
TECHNIQUE: Multidetector CT imaging of the maxillofacial structures was
performed. Multiplanar CT image reconstructions were also generated.

[Series 4: max soft · axial · 0.37mm/px · z∈[-199,-63]mm · 10 of 82 slices shown, 13 images]
[im 7/82  brain]
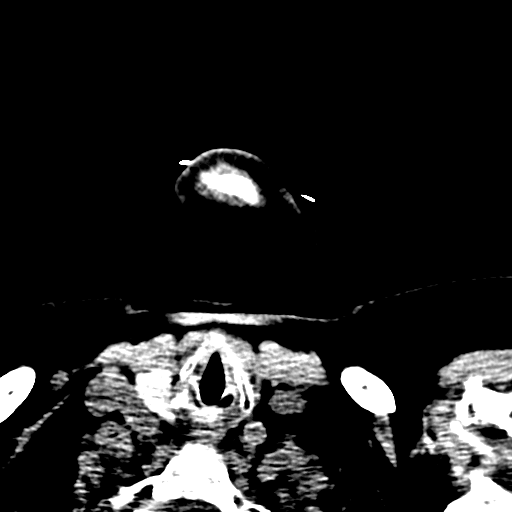
[im 7/82  bone]
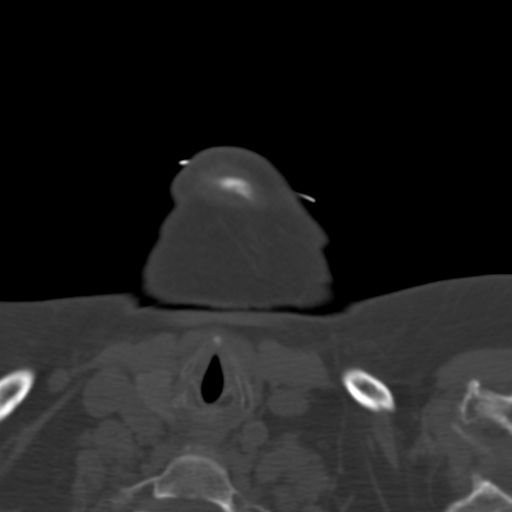
[im 14/82  bone]
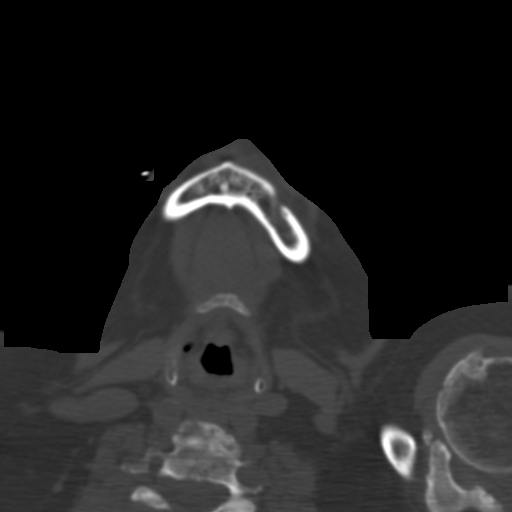
[im 21/82  bone]
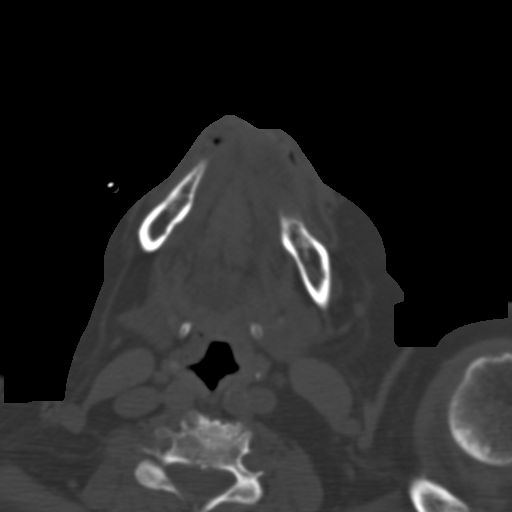
[im 31/82  bone]
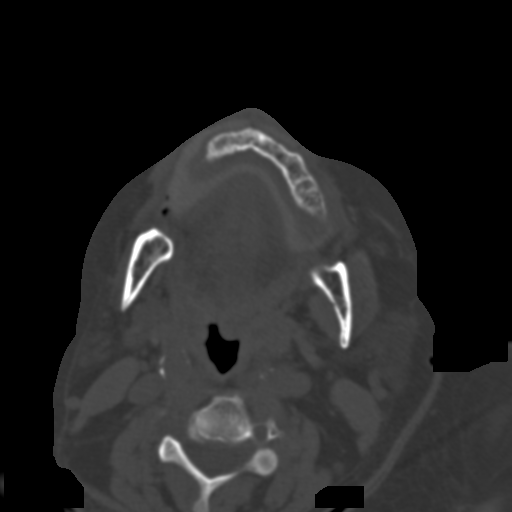
[im 38/82  brain]
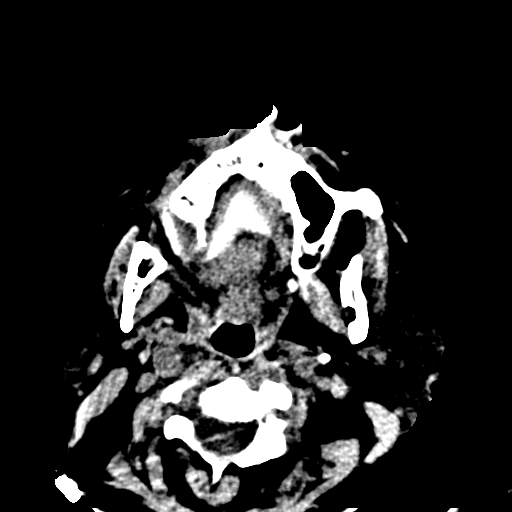
[im 38/82  bone]
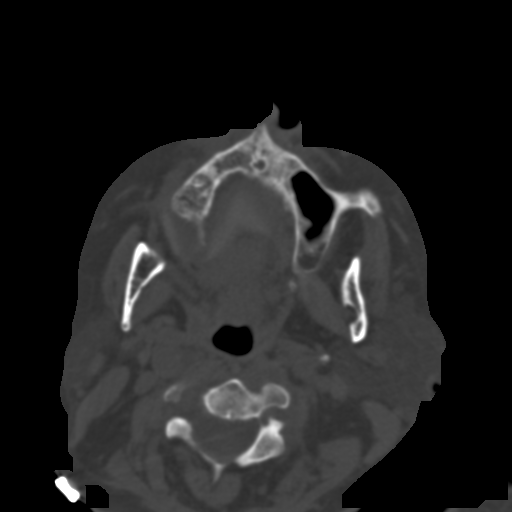
[im 44/82  bone]
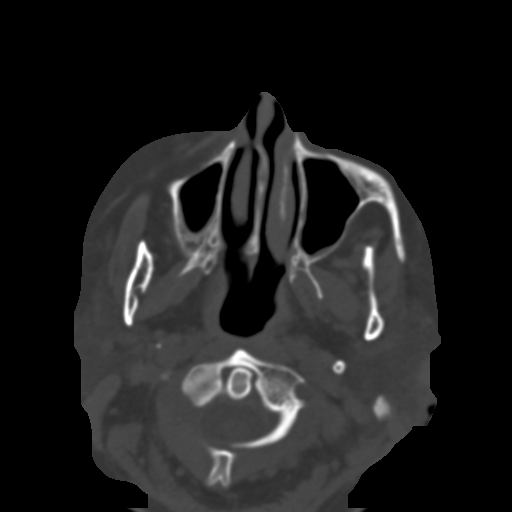
[im 51/82  bone]
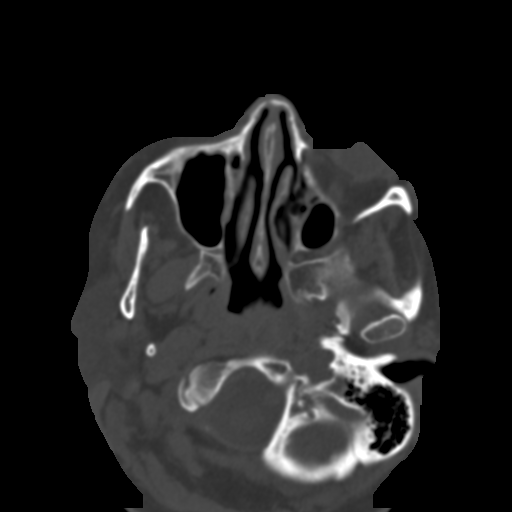
[im 61/82  bone]
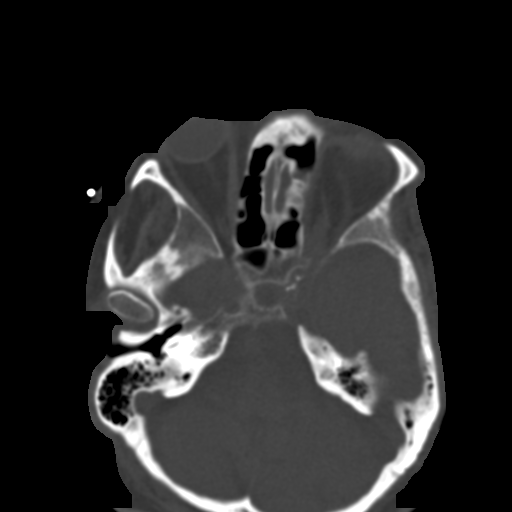
[im 68/82  brain]
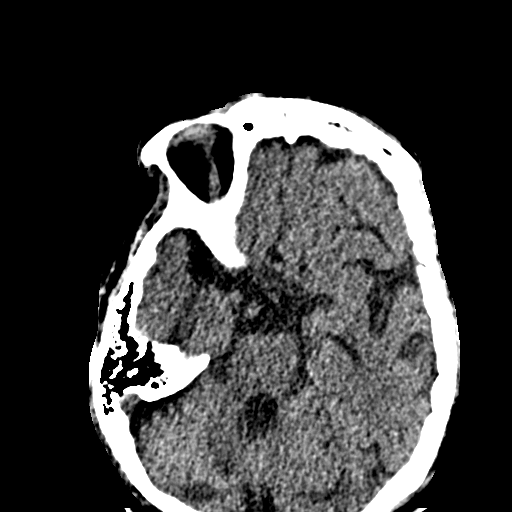
[im 68/82  bone]
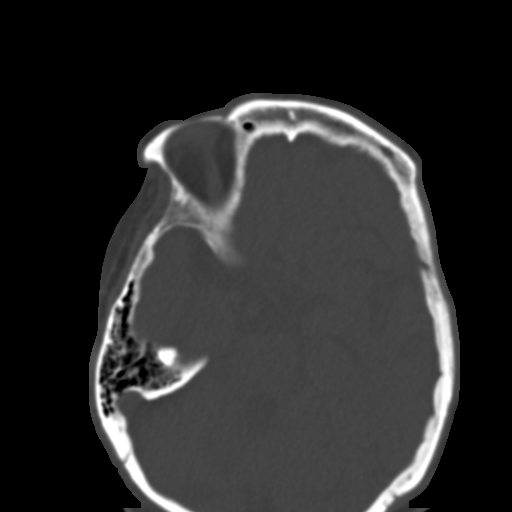
[im 75/82  bone]
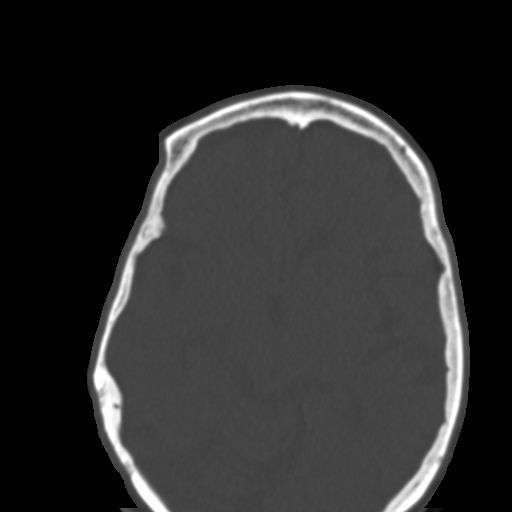

[Series 7: coronal soft · coronal · 0.37mm/px · 3 of 87 slices shown]
[im 29/87  bone]
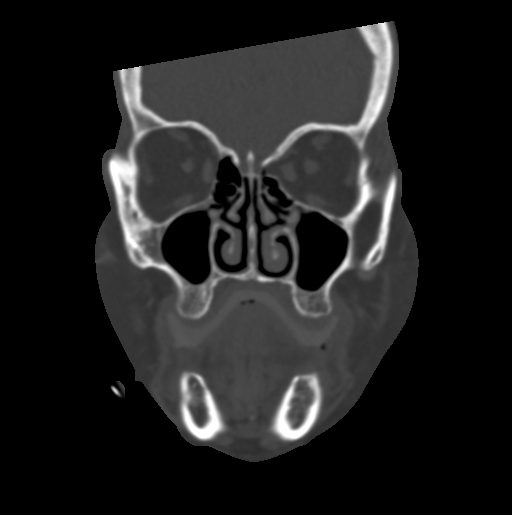
[im 39/87  bone]
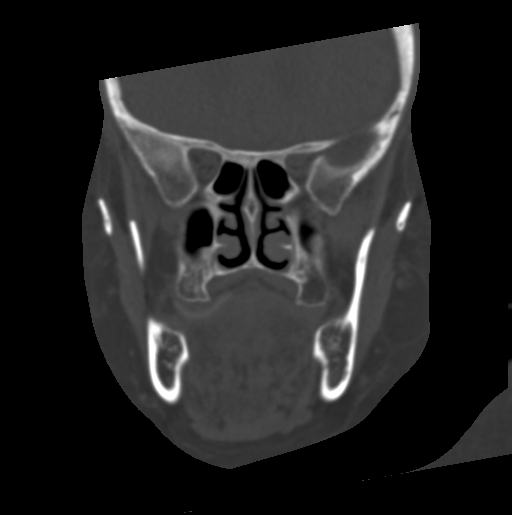
[im 48/87  bone]
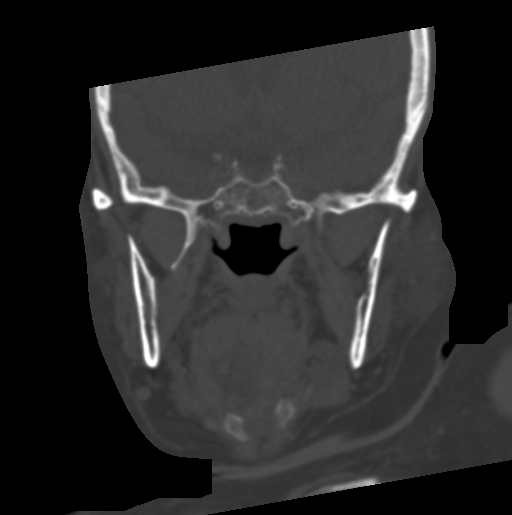

[Series 8: sagittal soft · sagittal · 0.37mm/px · 3 of 75 slices shown]
[im 29/75  bone]
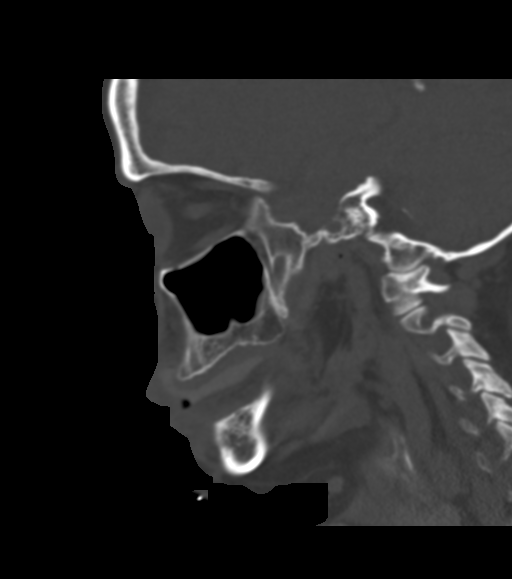
[im 38/75  bone]
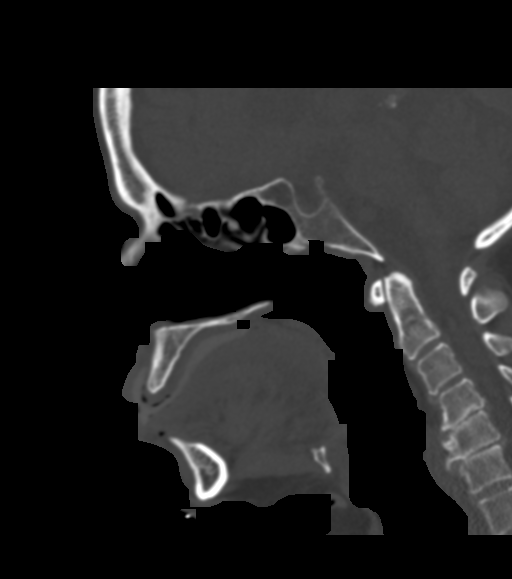
[im 46/75  bone]
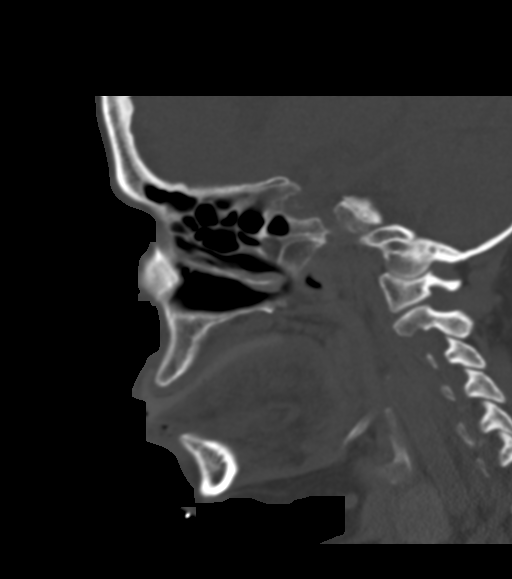

[16 of 47 positions shown; findings below may reference images not displayed]

FINDINGS: Osseous: No acute fracture of the nasal bone, zygomatic arches, or
mandibles. The temporomandibular joints are congruent. Patient is
edentulous.

Orbits: Remote fracture of the left medial orbital wall with
herniation of intraorbital fat. No acute orbital fracture. No globe
injury.

Sinuses: Trace mucosal thickening of the left maxillary sinus. No
sinus fracture or fluid level. Mastoid air cells are clear.

Soft tissues: Soft tissue edema and stranding involving the right
cheek.

Limited intracranial: No significant or unexpected finding.
IMPRESSION: 1. No acute facial bone fracture. Particularly, no fracture of the
right zygomatic arch or mandible.
2. Remote fracture of the left medial orbital wall with herniation
of intraorbital fat.
3. Soft tissue edema and stranding involving the right cheek.

## 2023-02-06 IMAGING — CT CT HEAD W/O CM
3 series · 15 of 47 positions shown, 18 images · non-contrast
Comparison: 07/07/2021

CLINICAL DATA: Headaches, history of fall on 07/07/2021

EXAM:
CT HEAD WITHOUT CONTRAST
TECHNIQUE: Contiguous axial images were obtained from the base of the skull
through the vertex without intravenous contrast.

[Series 2: head wo · axial · 0.47mm/px · z∈[-385,-255]mm · 9 of 32 slices shown, 12 images]
[im 3/32  brain]
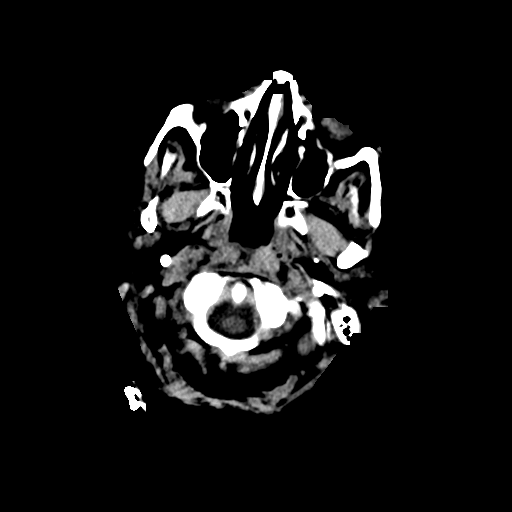
[im 3/32  bone]
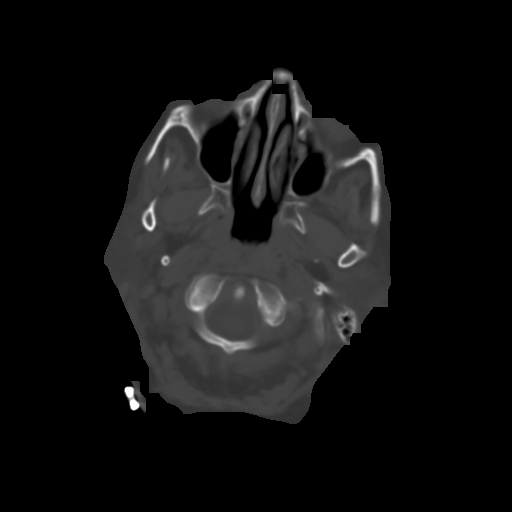
[im 6/32  brain]
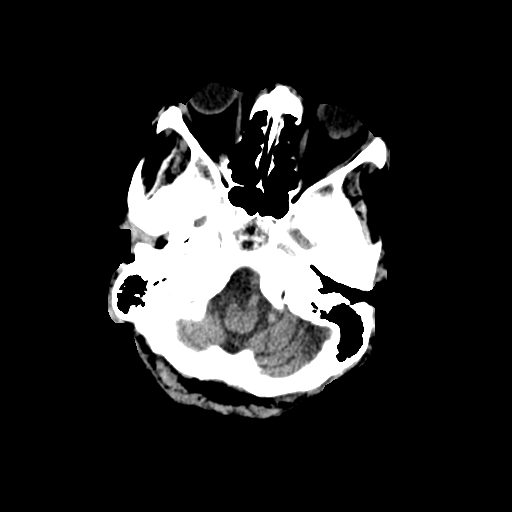
[im 9/32  brain]
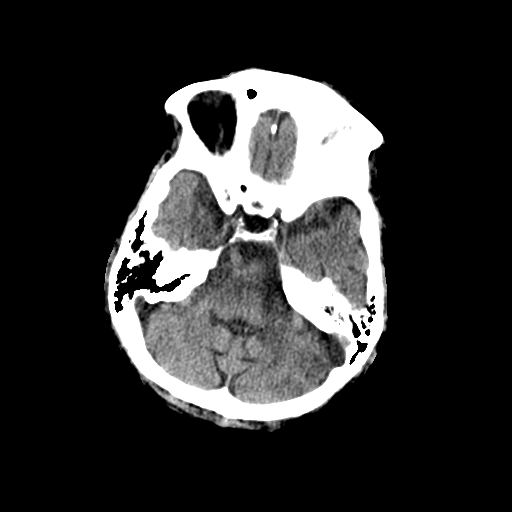
[im 12/32  brain]
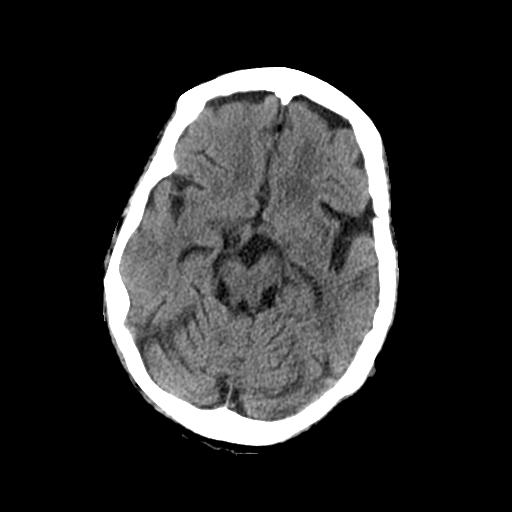
[im 17/32  brain]
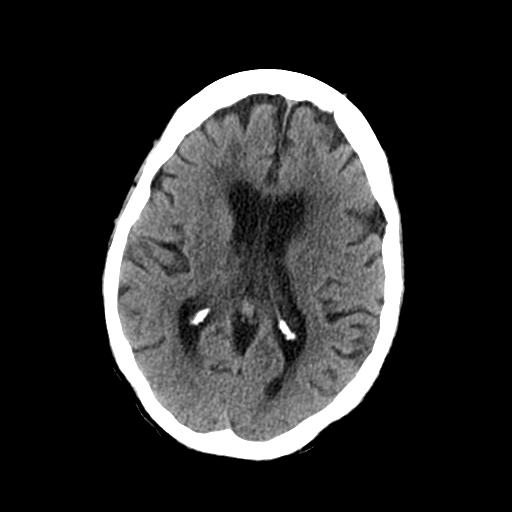
[im 17/32  bone]
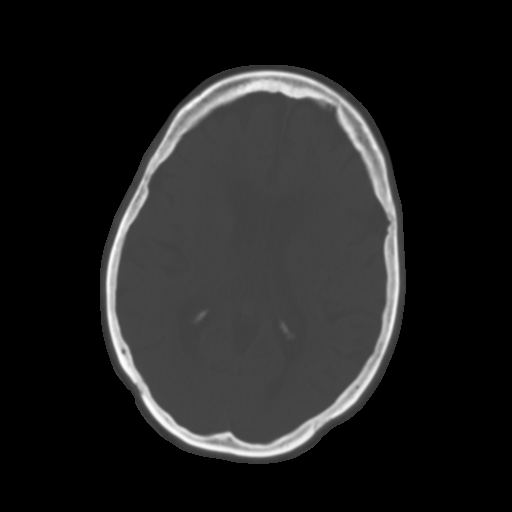
[im 20/32  brain]
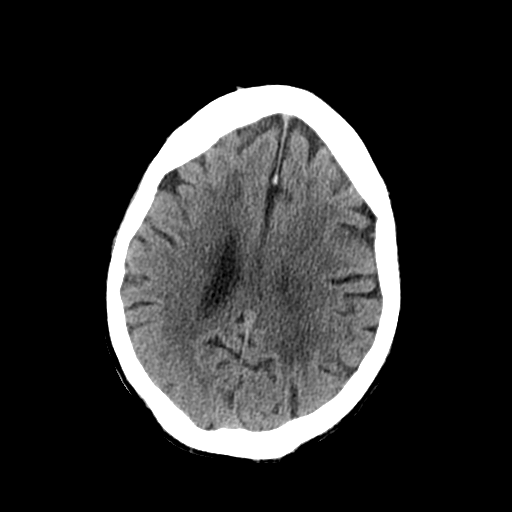
[im 23/32  brain]
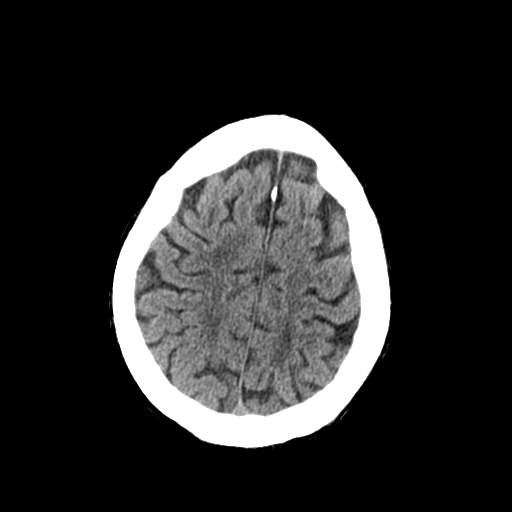
[im 26/32  brain]
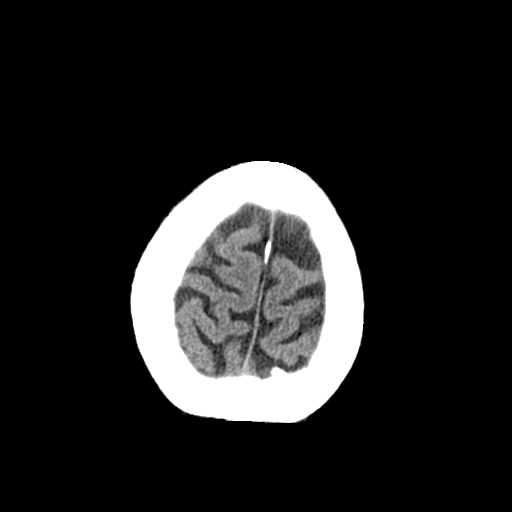
[im 29/32  brain]
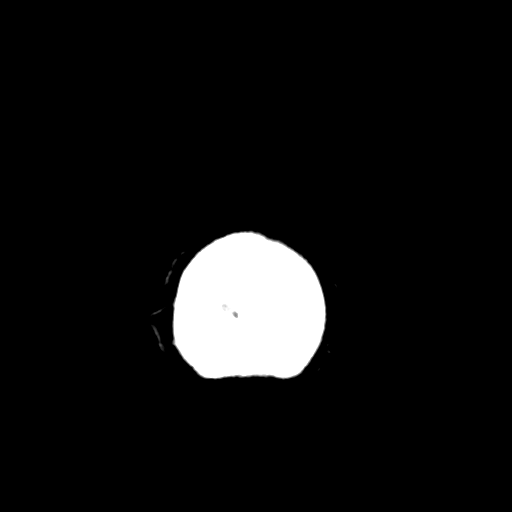
[im 29/32  bone]
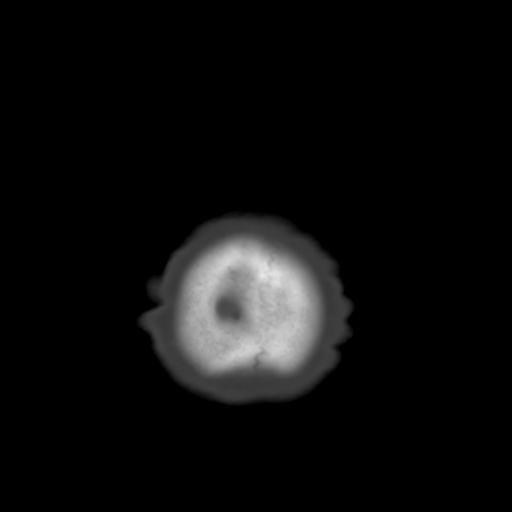

[Series 4: coronal soft tissue · coronal · 0.34mm/px · 3 of 65 slices shown]
[im 22/65  brain]
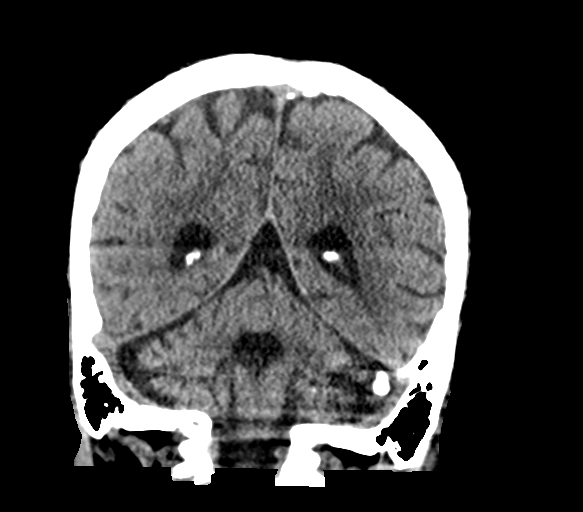
[im 29/65  brain]
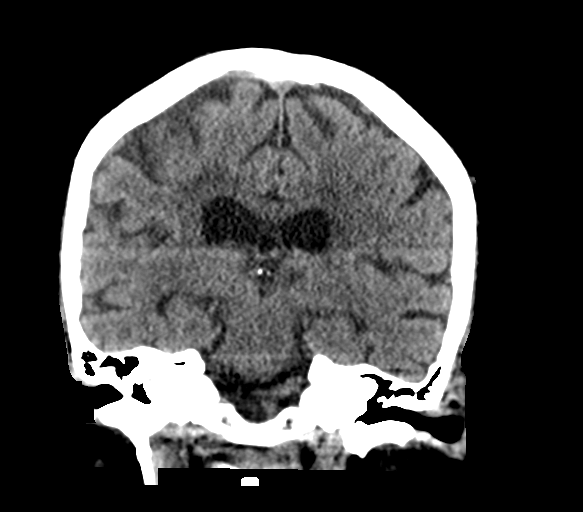
[im 36/65  brain]
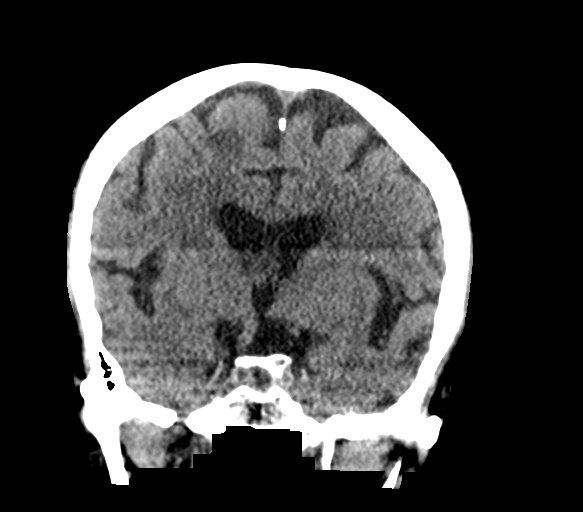

[Series 5: sagittal soft tissue · sagittal · 0.33mm/px · 3 of 50 slices shown]
[im 17/50  brain]
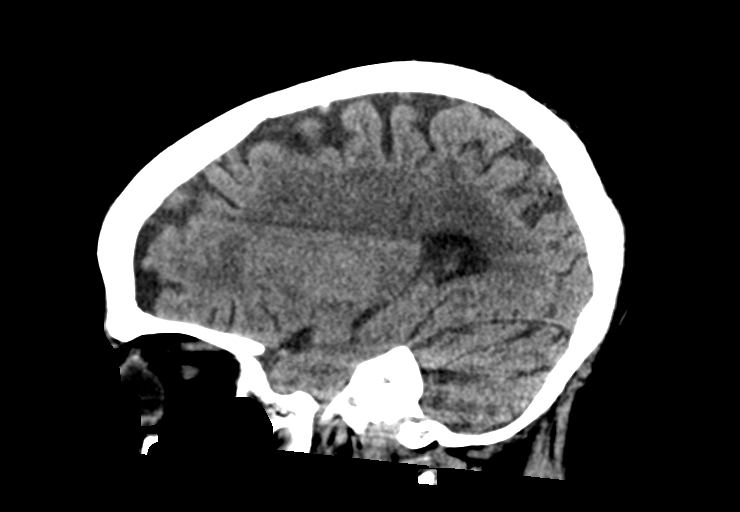
[im 25/50  brain]
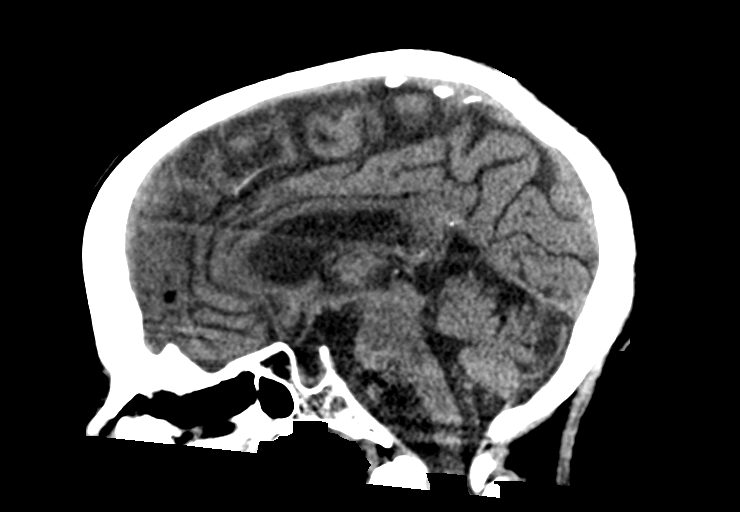
[im 33/50  brain]
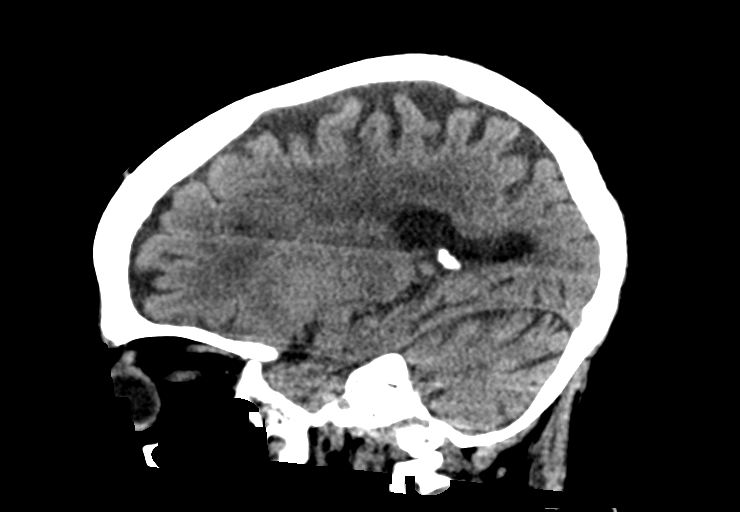

[15 of 47 positions shown; findings below may reference images not displayed]

FINDINGS: Brain: No evidence of acute infarction, hemorrhage, hydrocephalus,
extra-axial collection or mass lesion/mass effect. Chronic atrophic
and ischemic changes are noted stable from the prior exam.

Vascular: No hyperdense vessel or unexpected calcification.

Skull: Normal. Negative for fracture or focal lesion.

Sinuses/Orbits: No acute finding.

Other: None.
IMPRESSION: Chronic atrophic and ischemic changes stable from the recent exam.
No acute abnormality is noted.

## 2023-02-08 DIAGNOSIS — M6281 Muscle weakness (generalized): Secondary | ICD-10-CM | POA: Diagnosis not present

## 2023-02-09 DIAGNOSIS — M6281 Muscle weakness (generalized): Secondary | ICD-10-CM | POA: Diagnosis not present

## 2023-02-10 IMAGING — DX DG CHEST 1V PORT
1 series · 1 of 1 positions shown · non-contrast
Comparison: 07/01/2021

CLINICAL DATA: Respiratory distress.

EXAM:
PORTABLE CHEST 1 VIEW

[chest]
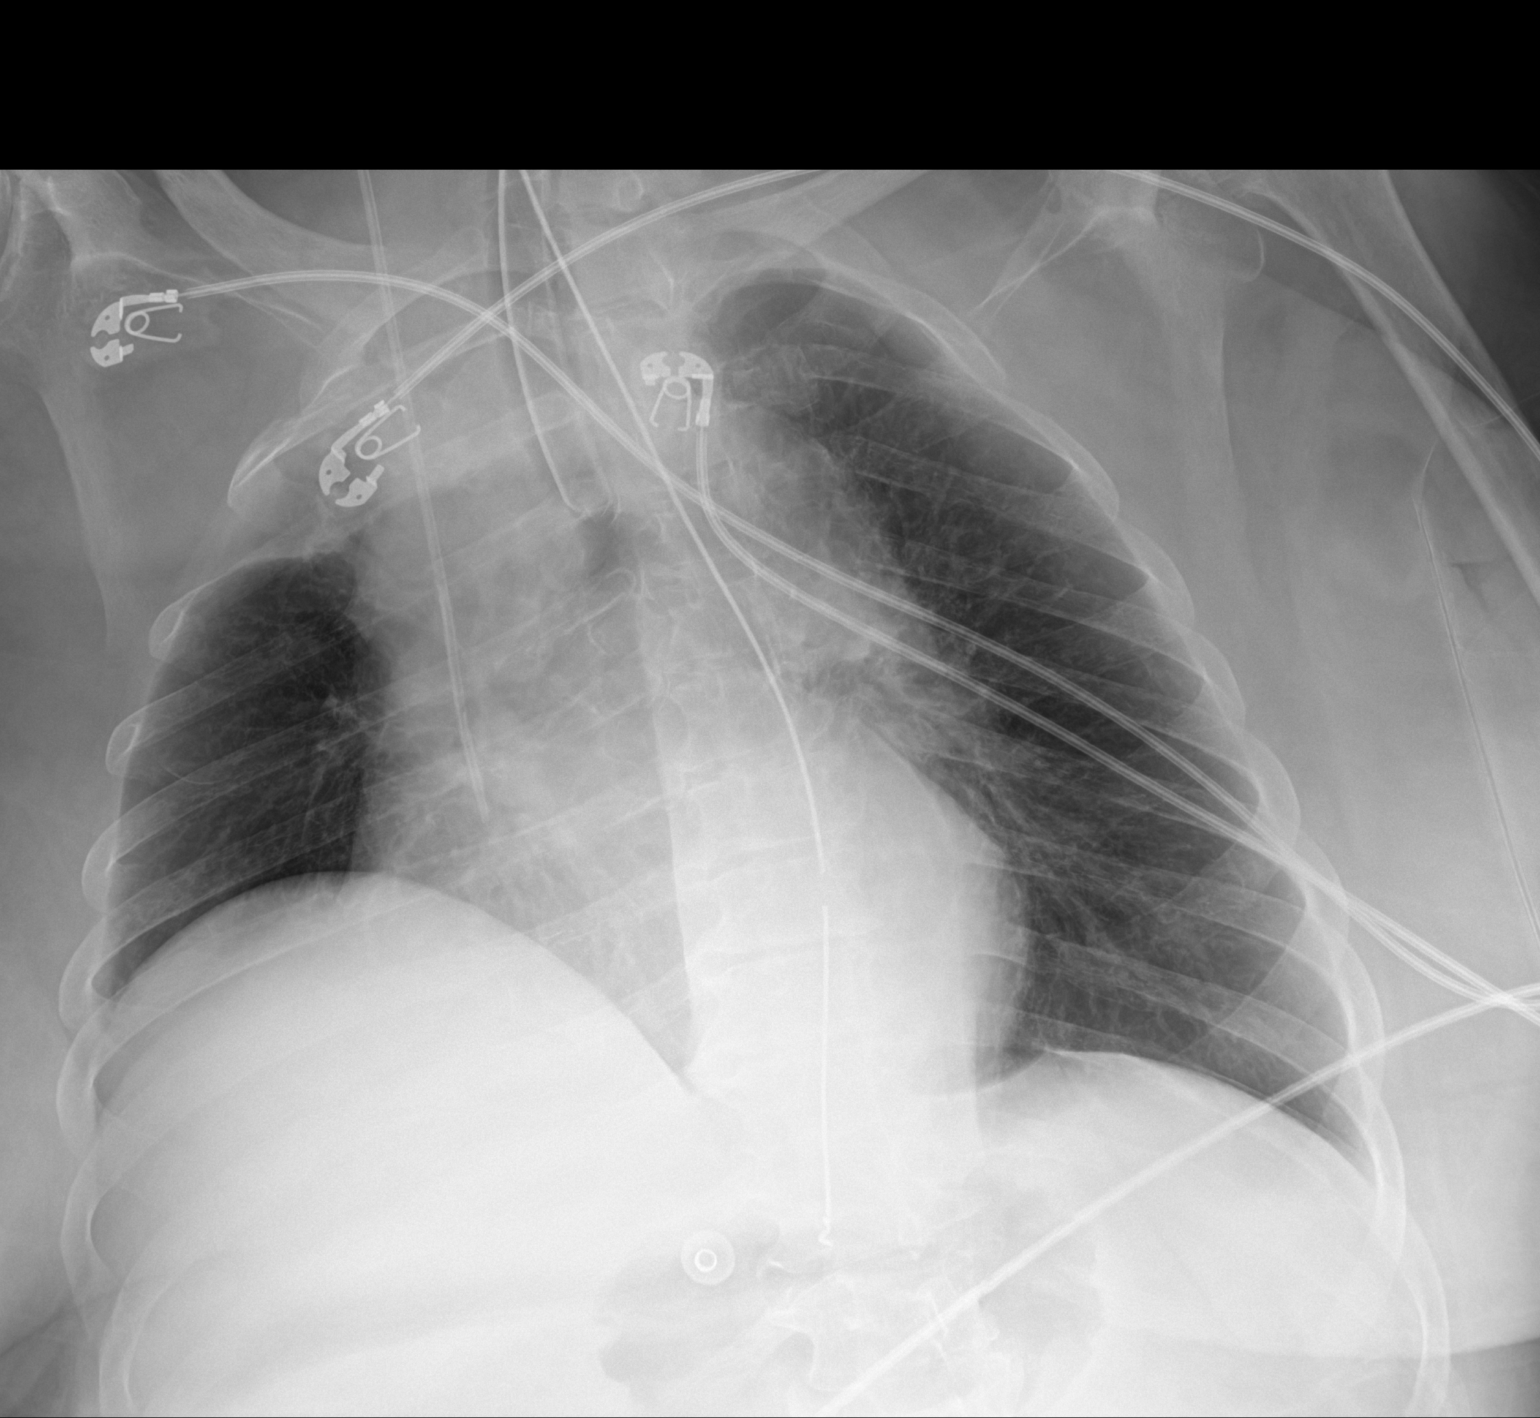

[1 of 1 positions shown; findings below may reference images not displayed]

FINDINGS: ETT tip is above the carina. There is a right IJ catheter with tip
at the cavoatrial junction. NG tube tip is just below the GE
junction with side port above the diaphragm. Recommend advancing
tube by approximately 8 cm. Heart size appears within normal limits.
There is asymmetric opacification within the right lung apex. This
may in part reflect rotational artifact although right upper lobe
atelectasis can not be excluded. Left lung appears clear.
IMPRESSION: 1. Asymmetric opacification within the right lung apex may in part
reflect rotational artifact, although right upper lobe atelectasis
or airspace consolidation can not be excluded.
2. Satisfactory position of right IJ catheter and ET tube.
3. The NG tube is under advanced with side port above the GE
junction. Recommend advancing tube by 8 cm.

## 2023-02-10 IMAGING — CT CT HEAD W/O CM
4 series · 15 of 47 positions shown, 17 images · non-contrast
Comparison: 07/16/2021

CLINICAL DATA: Delirium.

EXAM:
CT HEAD WITHOUT CONTRAST
TECHNIQUE: Contiguous axial images were obtained from the base of the skull
through the vertex without intravenous contrast.

[Series 3: head without · axial · non-contrast · 0.43mm/px · z∈[-1,+119]mm · 7 of 32 slices shown, 9 images]
[im 4/32  brain]
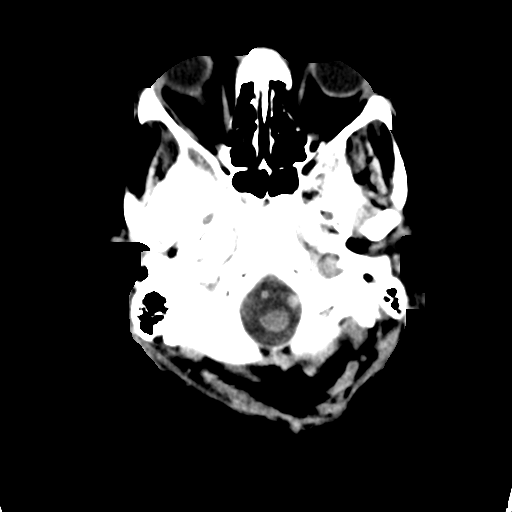
[im 4/32  bone]
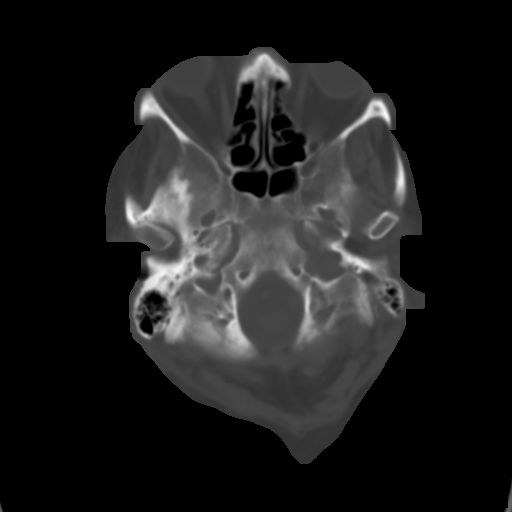
[im 8/32  brain]
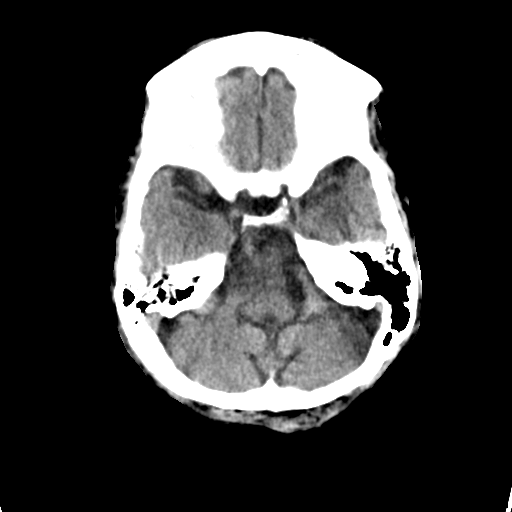
[im 12/32  brain]
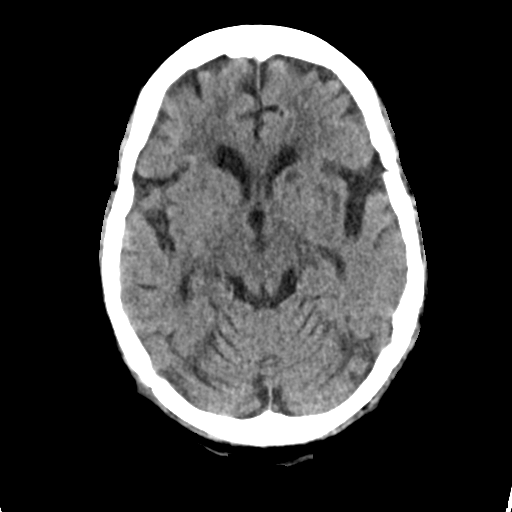
[im 16/32  brain]
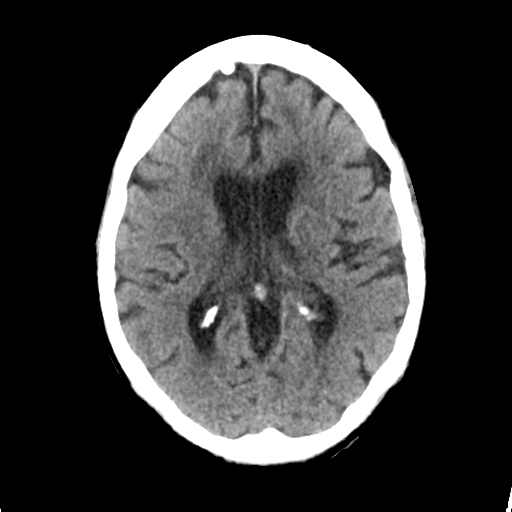
[im 20/32  brain]
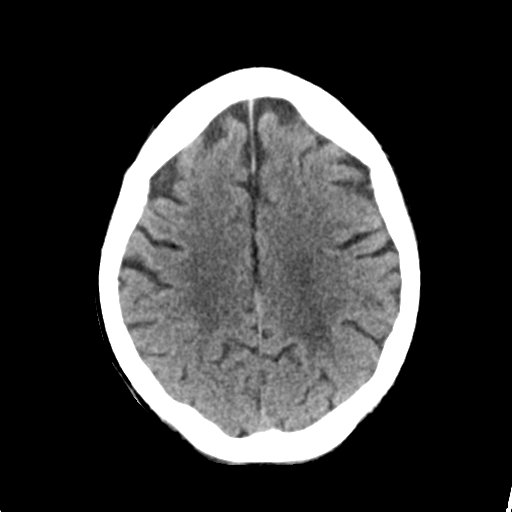
[im 20/32  bone]
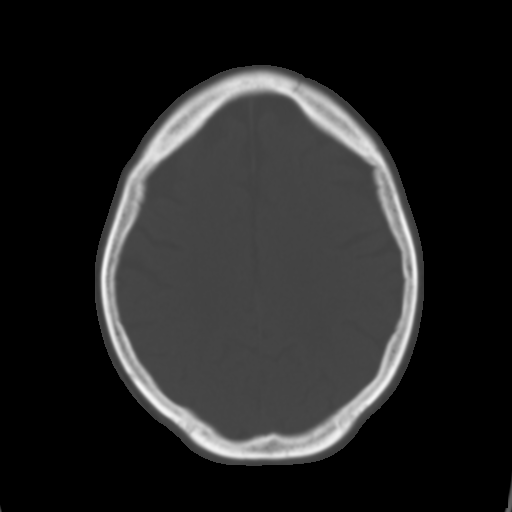
[im 24/32  brain]
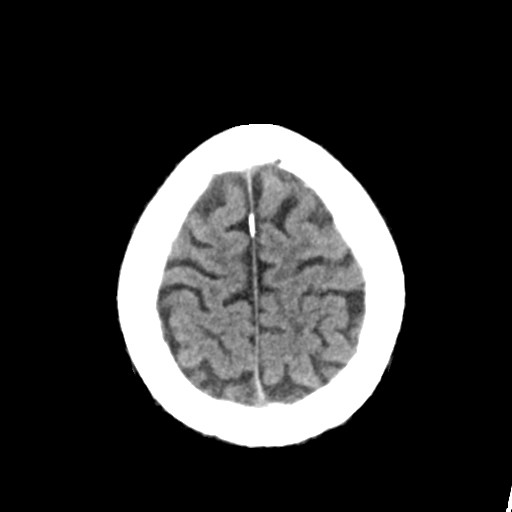
[im 28/32  brain]
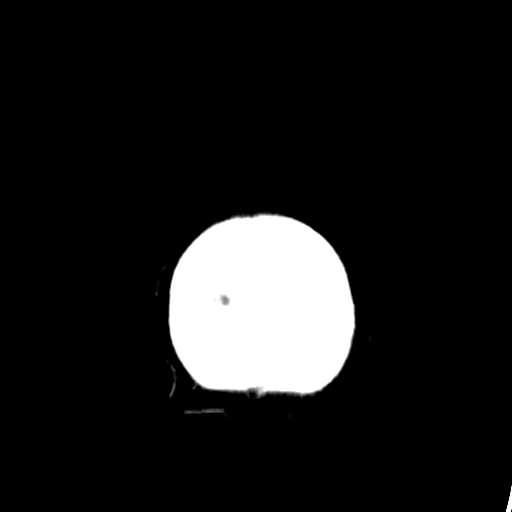

[Series 4: ax head bone · axial · 0.43mm/px · z∈[+6,+22]mm · 2 of 79 slices shown]
[im 8/79  bone]
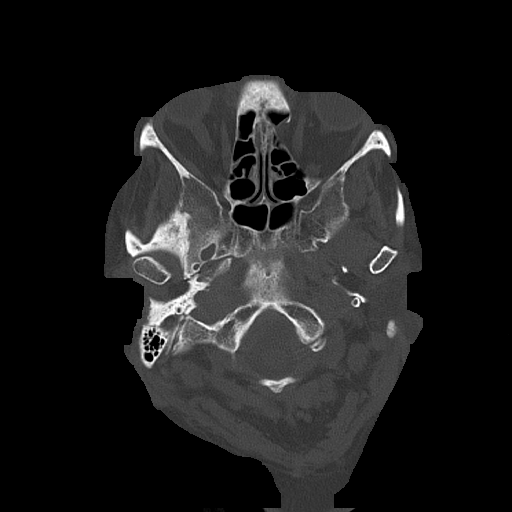
[im 16/79  bone]
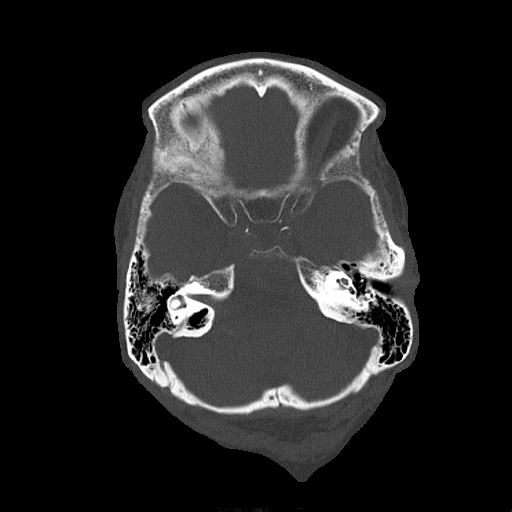

[Series 5: head without cor · coronal · non-contrast · 0.31mm/px · 3 of 65 slices shown]
[im 22/65  brain]
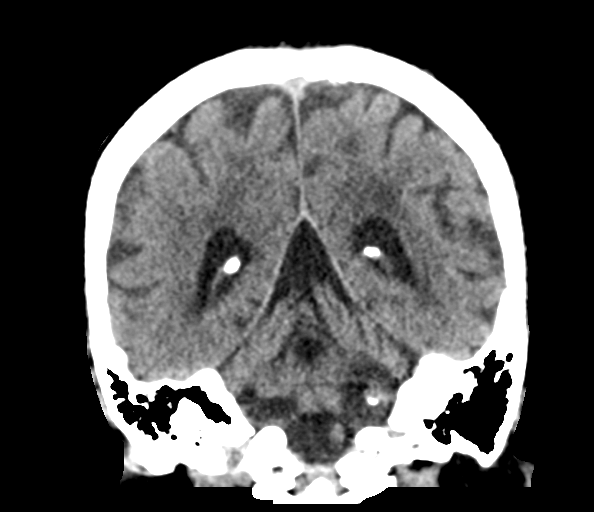
[im 29/65  brain]
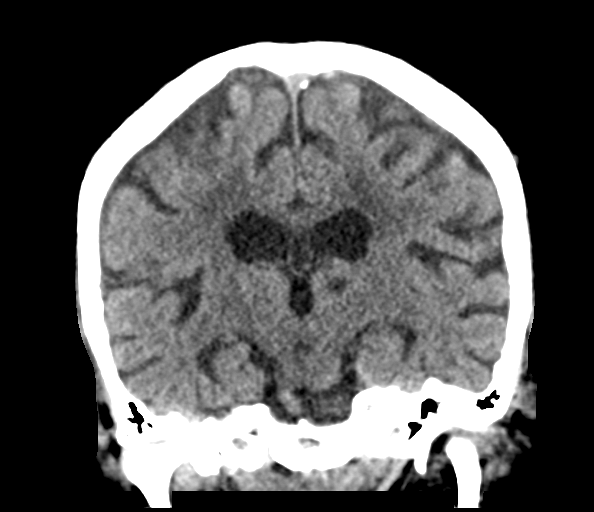
[im 36/65  brain]
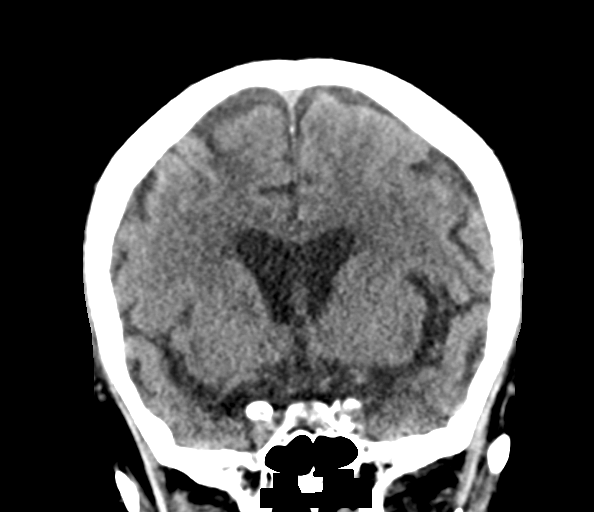

[Series 6: head without sag · sagittal · non-contrast · 0.31mm/px · 3 of 51 slices shown]
[im 17/51  brain]
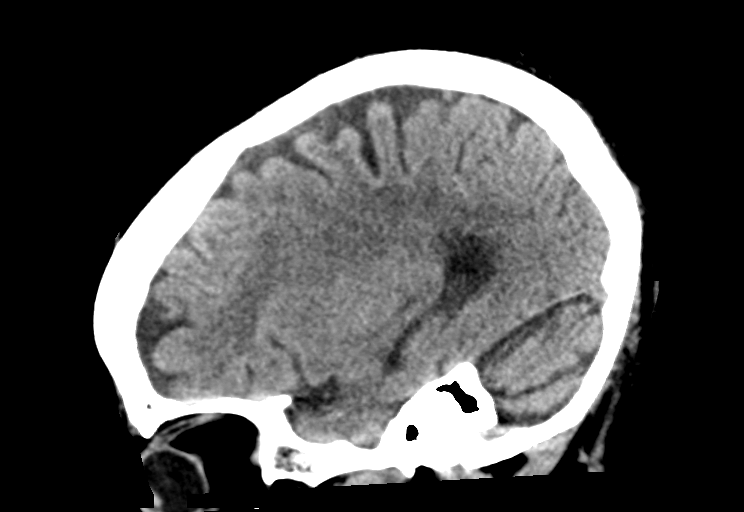
[im 26/51  brain]
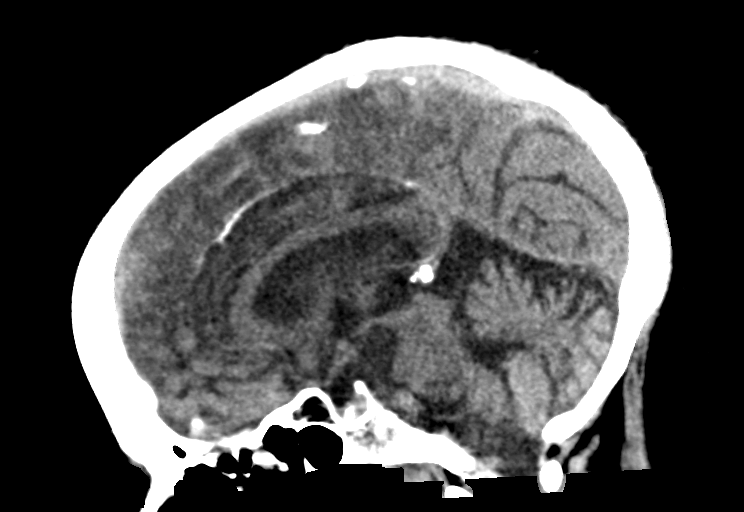
[im 34/51  brain]
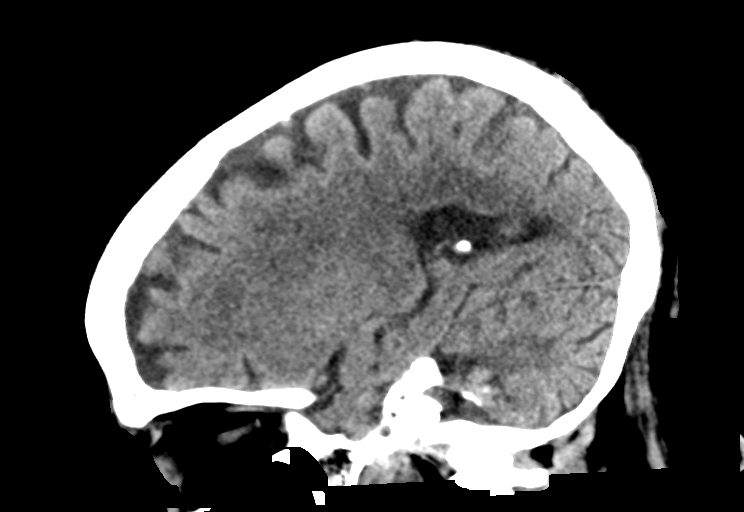

[15 of 47 positions shown; findings below may reference images not displayed]

FINDINGS: Brain: No evidence of acute infarction, hemorrhage, hydrocephalus,
extra-axial collection or mass lesion/mass effect. Small hyperdense
extra-axial nodule overlying the left frontal lobe is unchanged from
previous imaging compatible with a meningioma, image [DATE]. There is
mild diffuse low-attenuation within the subcortical and
periventricular white matter compatible with chronic microvascular
disease. Remote left basal ganglia lacunar infarct. Small bilateral
cerebellar hemisphere lacunar infarcts are also noted.

Vascular: No hyperdense vessel or unexpected calcification.

Skull: Normal. Negative for fracture or focal lesion.

Sinuses/Orbits: Paranasal sinuses and mastoid air cells are clear.
Remote fracture involving the medial wall of the left orbit is again
seen, image 71/4.

Other: None
IMPRESSION: 1. No acute intracranial abnormalities.
2. Chronic small vessel ischemic disease and brain atrophy.
3. Stable small left frontal meningioma.

## 2023-02-11 ENCOUNTER — Other Ambulatory Visit: Payer: Self-pay | Admitting: Family Medicine

## 2023-02-11 DIAGNOSIS — M6281 Muscle weakness (generalized): Secondary | ICD-10-CM | POA: Diagnosis not present

## 2023-02-11 IMAGING — DX DG ABD PORTABLE 1V
1 series · 1 of 1 positions shown · non-contrast
Comparison: None.

CLINICAL DATA: Feeding tube placement

EXAM:
PORTABLE ABDOMEN - 1 VIEW

[abdomen kub]
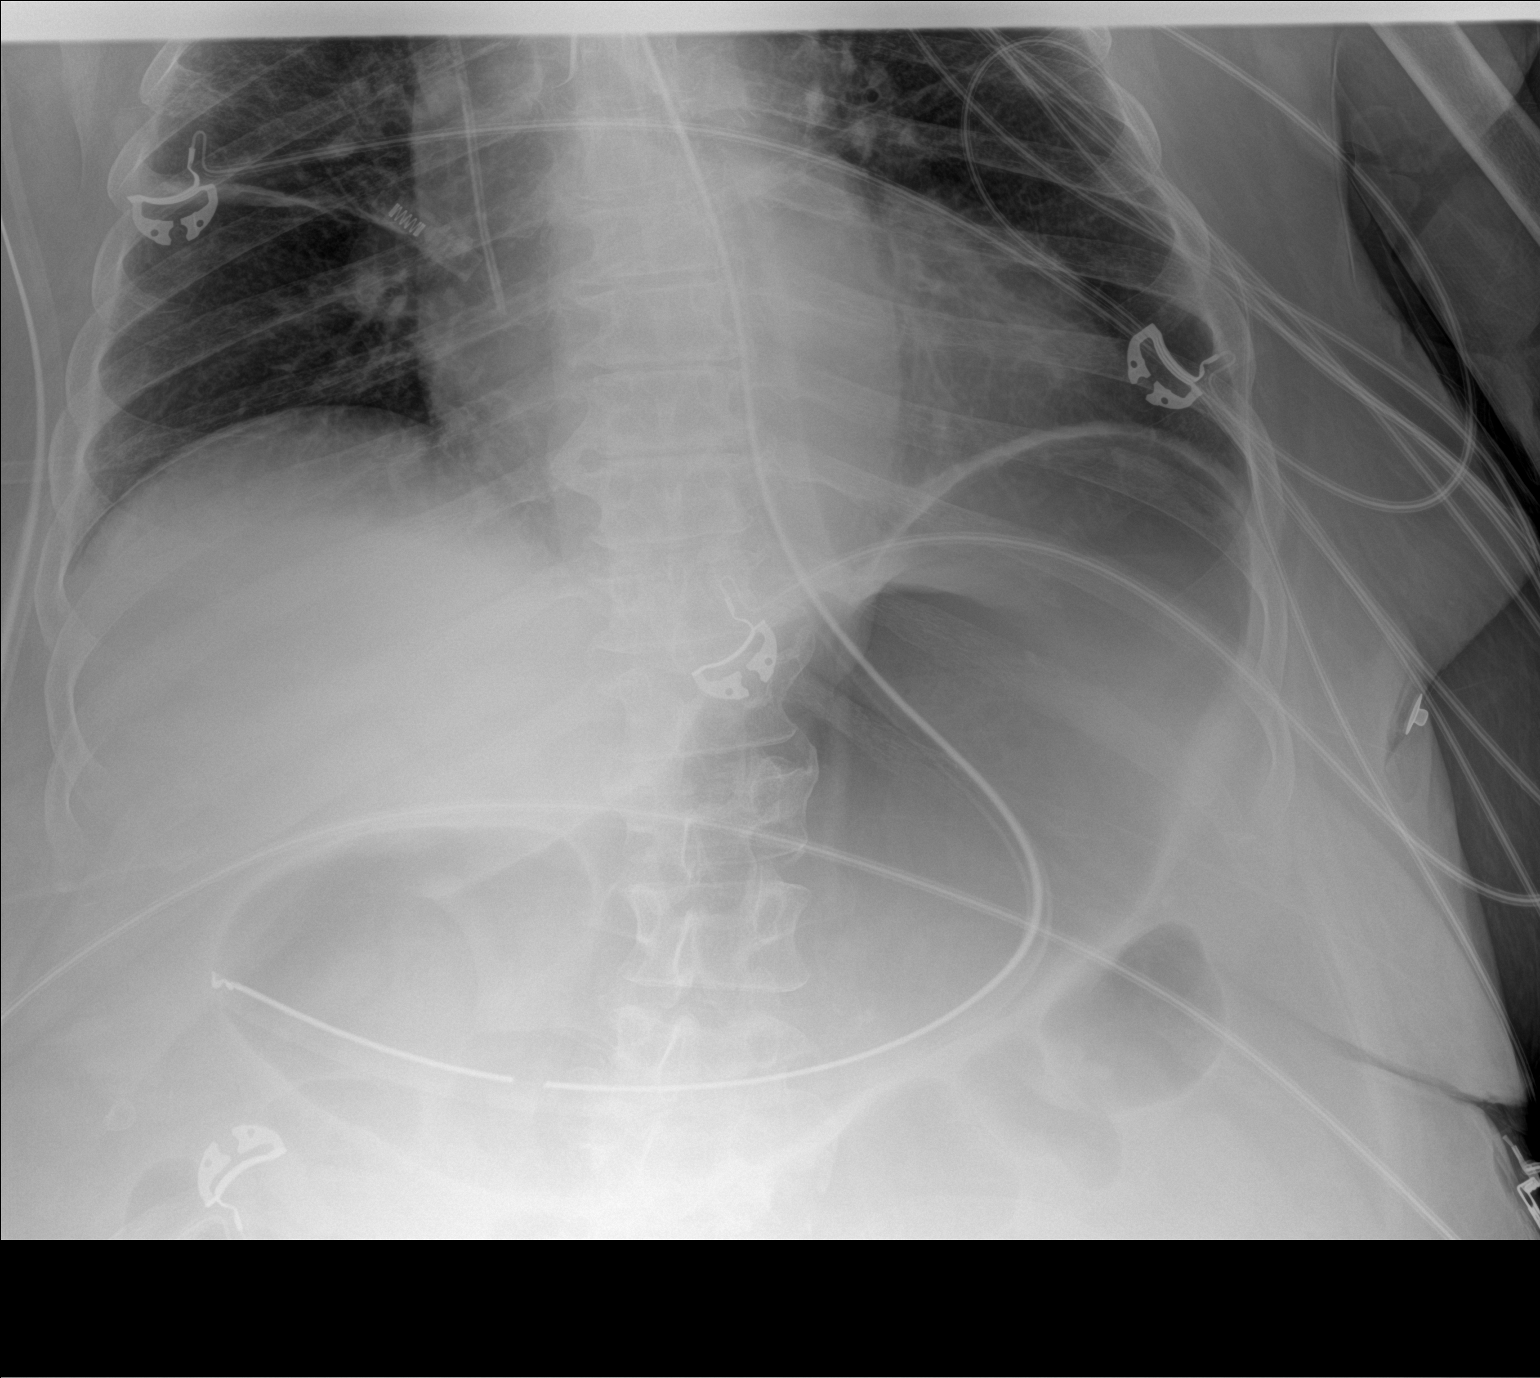

[1 of 1 positions shown; findings below may reference images not displayed]

FINDINGS: Esophagogastric tube with tip and side port below the diaphragm, tip
in the vicinity of the pylorus. The stomach is gas distended. No
obvious free air.
IMPRESSION: Esophagogastric tube with tip and side port below the diaphragm, tip
in the vicinity of the pylorus. The stomach is gas distended.

## 2023-02-15 NOTE — Telephone Encounter (Signed)
[facility] called to check status on forms   Physicians statement and glucose form Wants a call back

## 2023-02-16 DIAGNOSIS — M6281 Muscle weakness (generalized): Secondary | ICD-10-CM | POA: Diagnosis not present

## 2023-02-18 DIAGNOSIS — M6281 Muscle weakness (generalized): Secondary | ICD-10-CM | POA: Diagnosis not present

## 2023-02-19 ENCOUNTER — Other Ambulatory Visit: Payer: Self-pay | Admitting: Family Medicine

## 2023-02-22 DIAGNOSIS — M6281 Muscle weakness (generalized): Secondary | ICD-10-CM | POA: Diagnosis not present

## 2023-02-23 DIAGNOSIS — M6281 Muscle weakness (generalized): Secondary | ICD-10-CM | POA: Diagnosis not present

## 2023-02-25 DIAGNOSIS — M6281 Muscle weakness (generalized): Secondary | ICD-10-CM | POA: Diagnosis not present

## 2023-02-26 ENCOUNTER — Other Ambulatory Visit: Payer: Self-pay

## 2023-02-26 ENCOUNTER — Telehealth: Payer: Self-pay | Admitting: Family Medicine

## 2023-02-26 DIAGNOSIS — E1159 Type 2 diabetes mellitus with other circulatory complications: Secondary | ICD-10-CM

## 2023-02-26 MED ORDER — LANCETS MISC. MISC: 1.0000 | Freq: Three times a day (TID) | 0 refills | Status: DC

## 2023-02-26 NOTE — Telephone Encounter (Signed)
Fleet Contras called from [facility] need refill for Lancets  [facility]

## 2023-02-26 NOTE — Telephone Encounter (Signed)
Filled

## 2023-03-02 DIAGNOSIS — M6281 Muscle weakness (generalized): Secondary | ICD-10-CM | POA: Diagnosis not present

## 2023-03-03 DIAGNOSIS — M6281 Muscle weakness (generalized): Secondary | ICD-10-CM | POA: Diagnosis not present

## 2023-03-04 DIAGNOSIS — R2689 Other abnormalities of gait and mobility: Secondary | ICD-10-CM | POA: Diagnosis not present

## 2023-03-04 DIAGNOSIS — M6281 Muscle weakness (generalized): Secondary | ICD-10-CM | POA: Diagnosis not present

## 2023-03-08 DIAGNOSIS — M6281 Muscle weakness (generalized): Secondary | ICD-10-CM | POA: Diagnosis not present

## 2023-03-11 DIAGNOSIS — M6281 Muscle weakness (generalized): Secondary | ICD-10-CM | POA: Diagnosis not present

## 2023-03-12 ENCOUNTER — Telehealth: Payer: Self-pay | Admitting: Family Medicine

## 2023-03-12 NOTE — Telephone Encounter (Signed)
Call [facility] at [phone] when completed

## 2023-03-12 NOTE — Telephone Encounter (Signed)
[name] from [employer] stopped by the office to have provider signs forms on [employer] services, requested to be done within 10 minutes needs signed ASAP. Checking with provider  Copied Noted Sleeved

## 2023-03-15 ENCOUNTER — Other Ambulatory Visit: Payer: Self-pay | Admitting: Family Medicine

## 2023-03-15 ENCOUNTER — Telehealth: Payer: Self-pay | Admitting: Family Medicine

## 2023-03-15 ENCOUNTER — Other Ambulatory Visit: Payer: Self-pay

## 2023-03-15 DIAGNOSIS — E1159 Type 2 diabetes mellitus with other circulatory complications: Secondary | ICD-10-CM

## 2023-03-15 MED ORDER — LANCETS MISC. MISC: 1.0000 | Freq: Three times a day (TID) | 0 refills | Status: AC

## 2023-03-15 NOTE — Telephone Encounter (Signed)
Refill for lancets sent

## 2023-03-15 NOTE — Telephone Encounter (Signed)
[name] has not completed the form yet. [name] was notified when the form was dropped off on 5/17, that they would need to allow 10 days for the form to be completed. Will call [facility] when it is completed.

## 2023-03-15 NOTE — Telephone Encounter (Signed)
[name] called [facility] refill on safety lancets and test strips, has not yet received the lancets yet. But did get the test strips.

## 2023-03-15 NOTE — Telephone Encounter (Signed)
[facility] has called back again in regard to previous tele. Wants a call back. Is in need of forms to signed for payment.

## 2023-03-16 DIAGNOSIS — M6281 Muscle weakness (generalized): Secondary | ICD-10-CM | POA: Diagnosis not present

## 2023-03-18 DIAGNOSIS — M6281 Muscle weakness (generalized): Secondary | ICD-10-CM | POA: Diagnosis not present

## 2023-03-18 NOTE — Telephone Encounter (Signed)
[name] called from [facility] called checking time frame for this form to get paid. I explain could take up to 10 days for turn around time for all forms, said forms were all ready filled out and only needed a signautre. [name] would like for a nurse to return her call at [phone].

## 2023-03-19 NOTE — Telephone Encounter (Signed)
Spoke with Nettie Elm. Advised again that it still has not been 10 business days since form has been dropped off. I will call her as soon as it is finished.

## 2023-03-23 DIAGNOSIS — M6281 Muscle weakness (generalized): Secondary | ICD-10-CM | POA: Diagnosis not present

## 2023-03-24 NOTE — Telephone Encounter (Signed)
[facility] called back in regard to paperwork. Wants a call back

## 2023-03-25 ENCOUNTER — Telehealth: Payer: Self-pay | Admitting: Family Medicine

## 2023-03-25 DIAGNOSIS — M6281 Muscle weakness (generalized): Secondary | ICD-10-CM | POA: Diagnosis not present

## 2023-03-25 NOTE — Telephone Encounter (Signed)
Patient would like to change pcp to [facility]. Is not happy with current provider. Would like a call back with verdict .

## 2023-03-25 NOTE — Telephone Encounter (Signed)
Tried calling patient to clarify reason for wanting to switch PCP's. Patient is in a long term care facility and was not able to come to the phone when called. Will try again later.

## 2023-03-26 NOTE — Telephone Encounter (Signed)
Provider approval obtained.  Please reschedule follow-up with Dr. Durwin Nora.

## 2023-03-29 NOTE — Telephone Encounter (Signed)
Appt 06.12.2024

## 2023-03-29 NOTE — Telephone Encounter (Signed)
Forms faxed back to our office was not approved need more information copy at front desk, original put in provider box

## 2023-03-30 DIAGNOSIS — M6281 Muscle weakness (generalized): Secondary | ICD-10-CM | POA: Diagnosis not present

## 2023-03-31 DIAGNOSIS — I10 Essential (primary) hypertension: Secondary | ICD-10-CM | POA: Diagnosis not present

## 2023-03-31 DIAGNOSIS — E559 Vitamin D deficiency, unspecified: Secondary | ICD-10-CM | POA: Diagnosis not present

## 2023-03-31 DIAGNOSIS — E0789 Other specified disorders of thyroid: Secondary | ICD-10-CM | POA: Diagnosis not present

## 2023-03-31 DIAGNOSIS — E1159 Type 2 diabetes mellitus with other circulatory complications: Secondary | ICD-10-CM | POA: Diagnosis not present

## 2023-03-31 DIAGNOSIS — E7849 Other hyperlipidemia: Secondary | ICD-10-CM | POA: Diagnosis not present

## 2023-04-01 ENCOUNTER — Other Ambulatory Visit: Payer: Self-pay | Admitting: Family Medicine

## 2023-04-01 DIAGNOSIS — M6281 Muscle weakness (generalized): Secondary | ICD-10-CM | POA: Diagnosis not present

## 2023-04-01 DIAGNOSIS — N184 Chronic kidney disease, stage 4 (severe): Secondary | ICD-10-CM

## 2023-04-01 LAB — CBC WITH DIFFERENTIAL/PLATELET
Basophils Absolute: 0.1 10*3/uL (ref 0.0–0.2)
Basos: 1 %
EOS (ABSOLUTE): 0.2 10*3/uL (ref 0.0–0.4)
Eos: 4 %
Hematocrit: 34.1 % (ref 34.0–46.6)
Hemoglobin: 10.8 g/dL — ABNORMAL LOW (ref 11.1–15.9)
Immature Grans (Abs): 0 10*3/uL (ref 0.0–0.1)
Immature Granulocytes: 0 %
Lymphocytes Absolute: 1.5 10*3/uL (ref 0.7–3.1)
Lymphs: 25 %
MCH: 29.6 pg (ref 26.6–33.0)
MCHC: 31.7 g/dL (ref 31.5–35.7)
MCV: 93 fL (ref 79–97)
Monocytes Absolute: 0.3 10*3/uL (ref 0.1–0.9)
Monocytes: 5 %
Neutrophils Absolute: 3.7 10*3/uL (ref 1.4–7.0)
Neutrophils: 65 %
Platelets: 208 10*3/uL (ref 150–450)
RBC: 3.65 x10E6/uL — ABNORMAL LOW (ref 3.77–5.28)
RDW: 13.5 % (ref 11.7–15.4)
WBC: 5.7 10*3/uL (ref 3.4–10.8)

## 2023-04-01 LAB — TSH+FREE T4
Free T4: 0.84 ng/dL (ref 0.82–1.77)
TSH: 1.33 u[IU]/mL (ref 0.450–4.500)

## 2023-04-01 LAB — HEMOGLOBIN A1C
Est. average glucose Bld gHb Est-mCnc: 128 mg/dL
Hgb A1c MFr Bld: 6.1 % — ABNORMAL HIGH (ref 4.8–5.6)

## 2023-04-01 LAB — LIPID PANEL
Chol/HDL Ratio: 3.1 ratio (ref 0.0–4.4)
Cholesterol, Total: 184 mg/dL (ref 100–199)
HDL: 60 mg/dL (ref >39)
LDL Chol Calc (NIH): 108 mg/dL — ABNORMAL HIGH (ref 0–99)
Triglycerides: 86 mg/dL (ref 0–149)
VLDL Cholesterol Cal: 16 mg/dL (ref 5–40)

## 2023-04-01 LAB — CMP14+EGFR
ALT: 34 IU/L — ABNORMAL HIGH (ref 0–32)
AST: 29 IU/L (ref 0–40)
Albumin/Globulin Ratio: 1.5 (ref 1.2–2.2)
Albumin: 3.3 g/dL — ABNORMAL LOW (ref 3.8–4.9)
Alkaline Phosphatase: 120 IU/L (ref 44–121)
BUN/Creatinine Ratio: 16 (ref 9–23)
BUN: 48 mg/dL — ABNORMAL HIGH (ref 6–24)
Bilirubin Total: <0.2 mg/dL (ref 0.0–1.2)
CO2: 18 mmol/L — ABNORMAL LOW (ref 20–29)
Calcium: 8.7 mg/dL (ref 8.7–10.2)
Chloride: 111 mmol/L — ABNORMAL HIGH (ref 96–106)
Creatinine, Ser: 3.08 mg/dL — ABNORMAL HIGH (ref 0.57–1.00)
Globulin, Total: 2.2 g/dL (ref 1.5–4.5)
Glucose: 109 mg/dL — ABNORMAL HIGH (ref 70–99)
Potassium: 5.2 mmol/L (ref 3.5–5.2)
Sodium: 141 mmol/L (ref 134–144)
Total Protein: 5.5 g/dL — ABNORMAL LOW (ref 6.0–8.5)
eGFR: 17 mL/min/{1.73_m2} — ABNORMAL LOW (ref >59)

## 2023-04-01 LAB — VITAMIN D 25 HYDROXY (VIT D DEFICIENCY, FRACTURES): Vit D, 25-Hydroxy: 10.8 ng/mL — ABNORMAL LOW (ref 30.0–100.0)

## 2023-04-02 DIAGNOSIS — R2689 Other abnormalities of gait and mobility: Secondary | ICD-10-CM | POA: Diagnosis not present

## 2023-04-02 LAB — MICROALBUMIN / CREATININE URINE RATIO
Creatinine, Urine: 40.6 mg/dL
Microalb/Creat Ratio: 6171 mg/g creat — ABNORMAL HIGH (ref 0–29)
Microalbumin, Urine: 2505.4 ug/mL

## 2023-04-02 NOTE — Progress Notes (Signed)
Please inform Follow up appointment  on 04/15/23 bring all medications  To go over management of type 2 diabetes and high cholesterol

## 2023-04-02 NOTE — Progress Notes (Signed)
Please inform patient,  Referral placed to nephrology for Chronic Kidney Disease

## 2023-04-03 ENCOUNTER — Emergency Department (HOSPITAL_COMMUNITY)
Admission: EM | Admit: 2023-04-03 | Discharge: 2023-04-03 | Disposition: A | Discharge status: Home / Self Care | Payer: 59 | Attending: Emergency Medicine | Admitting: Emergency Medicine

## 2023-04-03 ENCOUNTER — Emergency Department (HOSPITAL_COMMUNITY): Payer: 59

## 2023-04-03 ENCOUNTER — Other Ambulatory Visit: Payer: Self-pay

## 2023-04-03 ENCOUNTER — Encounter (HOSPITAL_COMMUNITY): Payer: Self-pay

## 2023-04-03 DIAGNOSIS — M549 Dorsalgia, unspecified: Secondary | ICD-10-CM | POA: Diagnosis not present

## 2023-04-03 DIAGNOSIS — W19XXXA Unspecified fall, initial encounter: Secondary | ICD-10-CM | POA: Diagnosis not present

## 2023-04-03 DIAGNOSIS — Z743 Need for continuous supervision: Secondary | ICD-10-CM | POA: Diagnosis not present

## 2023-04-03 DIAGNOSIS — S300XXA Contusion of lower back and pelvis, initial encounter: Secondary | ICD-10-CM | POA: Diagnosis not present

## 2023-04-03 DIAGNOSIS — R6889 Other general symptoms and signs: Secondary | ICD-10-CM | POA: Diagnosis not present

## 2023-04-03 DIAGNOSIS — Z7901 Long term (current) use of anticoagulants: Secondary | ICD-10-CM | POA: Insufficient documentation

## 2023-04-03 DIAGNOSIS — M533 Sacrococcygeal disorders, not elsewhere classified: Secondary | ICD-10-CM | POA: Diagnosis not present

## 2023-04-03 DIAGNOSIS — M545 Low back pain, unspecified: Secondary | ICD-10-CM | POA: Diagnosis not present

## 2023-04-03 DIAGNOSIS — W010XXA Fall on same level from slipping, tripping and stumbling without subsequent striking against object, initial encounter: Secondary | ICD-10-CM | POA: Insufficient documentation

## 2023-04-03 NOTE — Discharge Instructions (Signed)
Tylenol or ibuprofen for pain, no signs of broken bones, this will likely hurt for another week or so as it gets better  Thank you for allowing [name] to treat you in the emergency department today.  After reviewing your examination and potential testing that was done it appears that you are safe to go home.  I would like for you to follow-up with your doctor within the next several days, have them obtain your results and follow-up with them to review all of these tests.  If you should develop severe or worsening symptoms return to the emergency department immediately

## 2023-04-03 NOTE — ED Provider Notes (Signed)
[place] EMERGENCY DEPARTMENT AT [facility] Provider Note   CSN: [id number] Arrival date & time: 04/03/23  0732     History  Chief Complaint  Patient presents with   [name] is a 57 y.o. female.   Fall   This patient is a 57 year old female who is in a wheelchair, she has had a prior stroke, she lives at a assisted care home, [facility], presents after having a fall that occurred yesterday when she was trying to get from the toilet back to her wheelchair when she lost her footing slipped and fell to the ground.  Her tailbone struck the ground.  She had no pain initially, the pain seemed to get a little worse last night and this morning she decided to be evaluated.  She denies head injury, denies any numbness or weakness outside of her baseline dysfunction from her stroke which she states affected her right side.  No chest pain shortness of breath coughing blurred vision or headache    Home Medications Prior to Admission medications   Medication Sig Start Date End Date Taking? Authorizing Provider  ACCU-CHEK GUIDE test strip USE TO CHECK BLOOD SUGAR WITH MEALS AND AT BEDTIME. 03/15/23   Del Newman Nip, Tenna Child, FNP  acetaminophen (TYLENOL) 325 MG tablet Take 2 tablets (650 mg total) by mouth every 6 (six) hours as needed for headache, fever or mild pain. Patient taking differently: Take 650 mg by mouth every 6 (six) hours as needed for fever (or pain). 11/04/20   Johnson, Clanford L, MD  ALPRAZolam Prudy Feeler) 0.5 MG tablet Take 0.5 mg by mouth 2 (two) times daily.    [provider]  amLODipine (NORVASC) 5 MG tablet Take 5 mg by mouth daily. 04/15/22   [provider]  ammonium lactate (AMLACTIN) 12 % lotion Apply 1 Application topically as needed for dry skin. 01/05/23   Del Nigel Berthold, FNP  ascorbic acid (VITAMIN C) 500 MG tablet Take 500 mg by mouth 2 (two) times daily.    [provider]  aspirin EC 81 MG EC tablet  Take 1 tablet (81 mg total) by mouth daily. Swallow whole. 05/28/21   Rolly Salter, MD  BD AUTOSHIELD DUO 30G X 5 MM MISC USE TO INJECT INSULIN WITH MEALS AND AT BEDTIME. 02/19/23   Del Newman Nip, Tenna Child, FNP  benztropine (COGENTIN) 0.5 MG tablet Take 1 tablet (0.5 mg total) by mouth 2 (two) times daily. 05/27/21   Rolly Salter, MD  cetirizine (ZYRTEC ALLERGY) 10 MG tablet Take 1 tablet (10 mg total) by mouth daily. 09/08/22   Roxy Horseman, PA-C  Dextromethorphan-guaiFENesin (GERI-TUSSIN DM) 10-100 MG/5ML liquid Take 10 mLs by mouth every 12 (twelve) hours.    [provider]  ELIQUIS 5 MG TABS tablet Take 5 mg by mouth 2 (two) times daily. 06/24/21   [provider]  feeding supplement (ENSURE ENLIVE / ENSURE PLUS) LIQD Take 237 mLs by mouth 3 (three) times daily between meals. 07/29/21   Meredeth Ide, MD  FLUoxetine (PROZAC) 40 MG capsule Take 2 capsules by mouth daily. 11/11/21   [provider]  HUMALOG KWIKPEN 100 UNIT/ML KwikPen Inject 1-10 Units into the skin See admin instructions. Inject 1-10 units into the skin before meals and at bedtime, PER SLIDING SCALE: BGL 151-200 = 1 unit; 201-250 = 2 units; 251-300 = 4 units; 301-350 = 6 units; 351-400 = 8 units; 401-450 = 10 units 03/28/21  [provider]  Lancets Misc. MISC 1 each by Does not apply route in the morning, at noon, and at bedtime. May substitute to any manufacturer covered by patient's insurance. 03/15/23 04/14/23  Del Nigel Berthold, FNP  levETIRAcetam (KEPPRA) 500 MG tablet Take 1 tablet (500 mg total) by mouth 2 (two) times daily. 07/29/21   Meredeth Ide, MD  loperamide (IMODIUM A-D) 2 MG tablet Take 4 mg by mouth 4 (four) times daily as needed for diarrhea or loose stools.    [provider]  metoprolol succinate (TOPROL-XL) 25 MG 24 hr tablet Take 50 mg by mouth daily.    [provider]  mirtazapine (REMERON) 7.5 MG tablet Take 7.5 mg by mouth at bedtime. Patient not  taking: Reported on 01/05/2023 12/17/21   [provider]  Multiple Vitamin (MULTIVITAMIN WITH MINERALS) TABS tablet Take 1 tablet by mouth daily. 07/30/21   Meredeth Ide, MD  olopatadine (PATADAY) 0.1 % ophthalmic solution Place 1 drop into both eyes 2 (two) times daily.    [provider]  omeprazole (PRILOSEC) 20 MG capsule Take 20 mg by mouth daily before breakfast.    [provider]  oxybutynin (DITROPAN-XL) 5 MG 24 hr tablet Take 5 mg by mouth in the morning.    [provider]  rosuvastatin (CRESTOR) 10 MG tablet Take 10 mg by mouth every evening.    [provider]  sennosides-docusate sodium (SENOKOT-S) 8.6-50 MG tablet Take 1 tablet by mouth in the morning and at bedtime. Patient not taking: Reported on 01/05/2023    [provider]  thiamine 100 MG tablet Take 1 tablet (100 mg total) by mouth daily. 07/30/21   Meredeth Ide, MD  tiotropium (SPIRIVA HANDIHALER) 18 MCG inhalation capsule Place 18 mcg into inhaler and inhale daily as needed (Shortness of breath).    [provider]  traMADol (ULTRAM) 50 MG tablet Take 0.5 tablets by mouth every 6 (six) hours as needed for moderate pain or severe pain.    [provider]  zinc sulfate 220 (50 Zn) MG capsule Take 1 capsule (220 mg total) by mouth daily. Patient not taking: Reported on 01/05/2023 12/08/20   Shon Hale, MD  albuterol (PROAIR HFA) 108 (90 Base) MCG/ACT inhaler INHALE 2 PUFFS EVERY 6 HOURS AS NEEDED FOR SHORTNESS OF BREATH/WHEEZING. Patient not taking: Reported on 04/21/2021 08/05/20 04/21/21  Kerri Perches, MD      Allergies    Sodium hypochlorite    Review of Systems   Review of Systems  Musculoskeletal:  Positive for back pain.    Physical Exam Updated Vital Signs BP (!) 182/108 (BP Location: Left Arm)   Pulse 73   Temp 98.2 F (36.8 C) (Oral)   Resp 16   Ht 1.549 m (5\' 1" )   Wt 76 kg   LMP 05/19/2016   SpO2 99%   BMI 31.66 kg/m   Physical Exam Vitals and nursing note reviewed.  Constitutional:      Appearance: She is well-developed. She is not diaphoretic.  HENT:     Head: Normocephalic and atraumatic.  Eyes:     General:        Right eye: No discharge.        Left eye: No discharge.     Conjunctiva/sclera: Conjunctivae normal.  Pulmonary:     Effort: Pulmonary effort is normal. No respiratory distress.  Abdominal:     Tenderness: There is no abdominal tenderness.  Musculoskeletal:  General: Tenderness (There is no tenderness over the cervical thoracic or lumbar spines, there is some tenderness over the lower sacrum and coccyx) present.  Skin:    General: Skin is warm and dry.     Findings: No erythema or rash.  Neurological:     Mental Status: She is alert.     Coordination: Coordination normal.     Comments: Has normal grip bilaterally, normal speech, cranial nerves III through XII appear to be intact, she is able to move both legs and has good sensation.  She is generally weak     ED Results / Procedures / Treatments   Labs (all labs ordered are listed, but only abnormal results are displayed) Labs Reviewed - No data to display  EKG None  Radiology DG Sacrum/Coccyx  Result Date: 04/03/2023 CLINICAL DATA:  Patient fell on her back side. Pain radiating down both legs. EXAM: SACRUM AND COCCYX - 2+ VIEW COMPARISON:  None Available. FINDINGS: There is no evidence of fracture or other focal bone lesions. Degenerative changes are noted at L4-5 at L5-S1. Atherosclerotic calcification overlies the sacrum bilaterally on frontal projections. IMPRESSION: 1. No acute bony abnormality. 2. Degenerative changes in the lower lumbar spine. Electronically Signed   By: Kennith Center M.D.   On: 04/03/2023 09:20    Procedures Procedures    Medications Ordered in ED Medications - No data to display  ED Course/ Medical Decision Making/ A&P                             Medical Decision Making Amount and/or  Complexity of Data Reviewed Radiology: ordered.   The patient's exam is consistent with a possible bruise or fracture of her sacrum or coccyx.  There is no lumbar spine tenderness, her vital signs reflect some hypertension but she is not tachycardic, there is no neurologic symptoms to suggest that this is an acute stroke and she has no other systemic symptoms to suggest that there is an underlying pathologic process that led to the fall.  The patient is agreeable to imaging.  Supportive care otherwise  Imaging: I personally viewed and interpreted the x-rays of the sacrum, there is no apparent fractures, I agree with the radiologist interpretation.  Patient has hypertension, will need to take all medications upon arrival home, no signs of emergent need for medical stabilizing care in the emergency department, stable for discharge        Final Clinical Impression(s) / ED Diagnoses Final diagnoses:  Contusion of sacrum, initial encounter    Rx / DC Orders ED Discharge Orders     None         Eber Hong, MD 04/03/23 [phone]

## 2023-04-03 NOTE — ED Triage Notes (Signed)
Pt reports she fell yesterday evening in the bathroom at her facility.  Reports she fell on her backside and has low back pain radiating down both legs.

## 2023-04-07 ENCOUNTER — Ambulatory Visit: Payer: 59 | Admitting: Family Medicine

## 2023-04-08 ENCOUNTER — Telehealth: Payer: Self-pay

## 2023-04-08 NOTE — Telephone Encounter (Signed)
Transition Care Management Unsuccessful Follow-up Telephone Call  Date of discharge and from where:  [facility]  Attempts:  1st Attempt  Reason for unsuccessful TCM follow-up call:  No answer/busy   Lenard Forth [facility] Guide, [facility] [phone] [address] [place], [place], [place] [address] Phone: [phone] Email: [email or web address]

## 2023-04-09 ENCOUNTER — Telehealth: Payer: Self-pay

## 2023-04-09 ENCOUNTER — Other Ambulatory Visit: Payer: Self-pay | Admitting: Family Medicine

## 2023-04-09 DIAGNOSIS — R2689 Other abnormalities of gait and mobility: Secondary | ICD-10-CM | POA: Diagnosis not present

## 2023-04-09 NOTE — Telephone Encounter (Signed)
Transition Care Management Follow-up Telephone Call Date of discharge and from where: [name]/8 How have you been since you were released from the hospital? Doing good  Any questions or concerns? No  Items Reviewed: Did the pt receive and understand the discharge instructions provided? Yes  Medications obtained and verified? Yes  Other? No  Any new allergies since your discharge? No  Dietary orders reviewed? No Do you have support at home? Yes  ALF    Follow up appointments reviewed:  PCP Hospital f/u appt confirmed? Yes  Scheduled to see  on  @ . Specialist Hospital f/u appt confirmed? No  Scheduled to see  on  @ . Are transportation arrangements needed? No  If their condition worsens, is the pt aware to call PCP or go to the Emergency Dept.? Yes Was the patient provided with contact information for the PCP's office or ED? Yes Was to pt encouraged to call back with questions or concerns? Yes

## 2023-04-12 ENCOUNTER — Emergency Department (HOSPITAL_COMMUNITY)
Admission: EM | Admit: 2023-04-12 | Discharge: 2023-04-13 | Disposition: A | Discharge status: Home / Self Care | Payer: 59 | Source: Home / Self Care | Attending: Emergency Medicine | Admitting: Emergency Medicine

## 2023-04-12 ENCOUNTER — Other Ambulatory Visit: Payer: Self-pay

## 2023-04-12 ENCOUNTER — Emergency Department (HOSPITAL_COMMUNITY)
Admission: EM | Admit: 2023-04-12 | Discharge: 2023-04-12 | Disposition: A | Discharge status: Home / Self Care | Payer: 59 | Attending: Emergency Medicine | Admitting: Emergency Medicine

## 2023-04-12 ENCOUNTER — Emergency Department (HOSPITAL_COMMUNITY): Payer: 59

## 2023-04-12 ENCOUNTER — Encounter (HOSPITAL_COMMUNITY): Payer: Self-pay | Admitting: Emergency Medicine

## 2023-04-12 DIAGNOSIS — Z7982 Long term (current) use of aspirin: Secondary | ICD-10-CM | POA: Insufficient documentation

## 2023-04-12 DIAGNOSIS — I509 Heart failure, unspecified: Secondary | ICD-10-CM | POA: Insufficient documentation

## 2023-04-12 DIAGNOSIS — Z993 Dependence on wheelchair: Secondary | ICD-10-CM | POA: Diagnosis not present

## 2023-04-12 DIAGNOSIS — Z7951 Long term (current) use of inhaled steroids: Secondary | ICD-10-CM | POA: Diagnosis not present

## 2023-04-12 DIAGNOSIS — R739 Hyperglycemia, unspecified: Secondary | ICD-10-CM | POA: Diagnosis not present

## 2023-04-12 DIAGNOSIS — S3210XA Unspecified fracture of sacrum, initial encounter for closed fracture: Secondary | ICD-10-CM | POA: Diagnosis not present

## 2023-04-12 DIAGNOSIS — W19XXXA Unspecified fall, initial encounter: Secondary | ICD-10-CM | POA: Insufficient documentation

## 2023-04-12 DIAGNOSIS — I129 Hypertensive chronic kidney disease with stage 1 through stage 4 chronic kidney disease, or unspecified chronic kidney disease: Secondary | ICD-10-CM | POA: Insufficient documentation

## 2023-04-12 DIAGNOSIS — Z79899 Other long term (current) drug therapy: Secondary | ICD-10-CM | POA: Insufficient documentation

## 2023-04-12 DIAGNOSIS — N189 Chronic kidney disease, unspecified: Secondary | ICD-10-CM | POA: Insufficient documentation

## 2023-04-12 DIAGNOSIS — Z743 Need for continuous supervision: Secondary | ICD-10-CM | POA: Diagnosis not present

## 2023-04-12 DIAGNOSIS — I130 Hypertensive heart and chronic kidney disease with heart failure and stage 1 through stage 4 chronic kidney disease, or unspecified chronic kidney disease: Secondary | ICD-10-CM | POA: Insufficient documentation

## 2023-04-12 DIAGNOSIS — Z794 Long term (current) use of insulin: Secondary | ICD-10-CM | POA: Diagnosis not present

## 2023-04-12 DIAGNOSIS — I4891 Unspecified atrial fibrillation: Secondary | ICD-10-CM | POA: Insufficient documentation

## 2023-04-12 DIAGNOSIS — E1122 Type 2 diabetes mellitus with diabetic chronic kidney disease: Secondary | ICD-10-CM | POA: Diagnosis not present

## 2023-04-12 DIAGNOSIS — I10 Essential (primary) hypertension: Secondary | ICD-10-CM

## 2023-04-12 DIAGNOSIS — J449 Chronic obstructive pulmonary disease, unspecified: Secondary | ICD-10-CM | POA: Diagnosis not present

## 2023-04-12 DIAGNOSIS — Z8673 Personal history of transient ischemic attack (TIA), and cerebral infarction without residual deficits: Secondary | ICD-10-CM | POA: Insufficient documentation

## 2023-04-12 DIAGNOSIS — Z7901 Long term (current) use of anticoagulants: Secondary | ICD-10-CM | POA: Insufficient documentation

## 2023-04-12 DIAGNOSIS — R6889 Other general symptoms and signs: Secondary | ICD-10-CM | POA: Diagnosis not present

## 2023-04-12 DIAGNOSIS — M545 Low back pain, unspecified: Secondary | ICD-10-CM | POA: Diagnosis not present

## 2023-04-12 DIAGNOSIS — E119 Type 2 diabetes mellitus without complications: Secondary | ICD-10-CM | POA: Insufficient documentation

## 2023-04-12 MED ORDER — HYDROCODONE-ACETAMINOPHEN 5-325 MG PO TABS
1.0000 | ORAL_TABLET | Freq: Once | ORAL | Status: AC
  Administered 2023-04-12: 1 via ORAL
  Filled 2023-04-12: qty 1

## 2023-04-12 MED ORDER — AMLODIPINE BESYLATE 5 MG PO TABS: 5.0000 mg | ORAL_TABLET | Freq: Every day | ORAL | 0 refills | Status: DC

## 2023-04-12 MED ORDER — PREDNISONE 50 MG PO TABS
60.0000 mg | ORAL_TABLET | Freq: Once | ORAL | Status: AC
  Administered 2023-04-12: 60 mg via ORAL
  Filled 2023-04-12: qty 1

## 2023-04-12 MED ORDER — AMLODIPINE BESYLATE 5 MG PO TABS
5.0000 mg | ORAL_TABLET | Freq: Once | ORAL | Status: AC
  Administered 2023-04-12: 5 mg via ORAL
  Filled 2023-04-12: qty 1

## 2023-04-12 MED ORDER — PREDNISONE 10 MG (21) PO TBPK: ORAL_TABLET | Freq: Every day | ORAL | 0 refills | Status: DC

## 2023-04-12 MED ORDER — HYDROCODONE-ACETAMINOPHEN 5-325 MG PO TABS
1.0000 | ORAL_TABLET | Freq: Once | ORAL | Status: AC
  Administered 2023-04-13: 1 via ORAL
  Filled 2023-04-12: qty 1

## 2023-04-12 NOTE — ED Notes (Signed)
Pt returned from MRI °

## 2023-04-12 NOTE — ED Provider Notes (Cosign Needed Addendum)
[place] EMERGENCY DEPARTMENT AT [facility] Provider Note   CSN: [id number] Arrival date & time: 04/12/23  6440     History  Chief Complaint  Patient presents with   Back Pain   HPI [name] is a 57 y.o. female with prior stroke, COPD, A-fib, CHF, and diabetes presenting for back pain. States she fell on the eighth of this month and was evaluated here at [facility].  Was found to have a sacral contusion at that time.  States pain has been persistent but overall improved.  States now she is having numbness and both of her legs beginning just below her buttocks and radiating down below her knee. Denies urinary or bowel incontinence or retention.  Denies fever.  Denies any recent trauma aside from the fall a week ago.  States she is wheelchair-bound since her stroke 4 years ago but does work with PT on a regular basis to reestablish abilities to ambulate.   Back Pain      Home Medications Prior to Admission medications   Medication Sig Start Date End Date Taking? Authorizing Provider  ACCU-CHEK GUIDE test strip USE TO CHECK BLOOD SUGAR WITH MEALS AND AT BEDTIME. 03/15/23   Del Newman Nip, Tenna Child, FNP  acetaminophen (TYLENOL) 325 MG tablet Take 2 tablets (650 mg total) by mouth every 6 (six) hours as needed for headache, fever or mild pain. Patient taking differently: Take 650 mg by mouth every 6 (six) hours as needed for fever (or pain). 11/04/20   Johnson, Clanford L, MD  ALPRAZolam Prudy Feeler) 0.5 MG tablet Take 0.5 mg by mouth 2 (two) times daily.    [provider]  amLODipine (NORVASC) 5 MG tablet Take 5 mg by mouth daily. 04/15/22   [provider]  ammonium lactate (AMLACTIN) 12 % lotion Apply 1 Application topically as needed for dry skin. 01/05/23   Del Nigel Berthold, FNP  ascorbic acid (VITAMIN C) 500 MG tablet Take 500 mg by mouth 2 (two) times daily.    [provider]  aspirin EC 81 MG EC tablet Take 1 tablet (81 mg total) by  mouth daily. Swallow whole. 05/28/21   Rolly Salter, MD  BD AUTOSHIELD DUO 30G X 5 MM MISC USE TO INJECT INSULIN WITH MEALS AND AT BEDTIME. 04/09/23   Del Newman Nip, Tenna Child, FNP  benztropine (COGENTIN) 0.5 MG tablet Take 1 tablet (0.5 mg total) by mouth 2 (two) times daily. 05/27/21   Rolly Salter, MD  cetirizine (ZYRTEC ALLERGY) 10 MG tablet Take 1 tablet (10 mg total) by mouth daily. 09/08/22   Roxy Horseman, PA-C  Dextromethorphan-guaiFENesin (GERI-TUSSIN DM) 10-100 MG/5ML liquid Take 10 mLs by mouth every 12 (twelve) hours.    [provider]  ELIQUIS 5 MG TABS tablet Take 5 mg by mouth 2 (two) times daily. 06/24/21   [provider]  feeding supplement (ENSURE ENLIVE / ENSURE PLUS) LIQD Take 237 mLs by mouth 3 (three) times daily between meals. 07/29/21   Meredeth Ide, MD  FLUoxetine (PROZAC) 40 MG capsule Take 2 capsules by mouth daily. 11/11/21   [provider]  HUMALOG KWIKPEN 100 UNIT/ML KwikPen Inject 1-10 Units into the skin See admin instructions. Inject 1-10 units into the skin before meals and at bedtime, PER SLIDING SCALE: BGL 151-200 = 1 unit; 201-250 = 2 units; 251-300 = 4 units; 301-350 = 6 units; 351-400 = 8 units; 401-450 = 10 units 03/28/21   [provider]  Lancets Misc. MISC 1 each by Does not apply route in the morning, at noon, and at bedtime. May substitute to any manufacturer covered by patient's insurance. 03/15/23 04/14/23  Del Nigel Berthold, FNP  levETIRAcetam (KEPPRA) 500 MG tablet Take 1 tablet (500 mg total) by mouth 2 (two) times daily. 07/29/21   Meredeth Ide, MD  loperamide (IMODIUM A-D) 2 MG tablet Take 4 mg by mouth 4 (four) times daily as needed for diarrhea or loose stools.    [provider]  metoprolol succinate (TOPROL-XL) 25 MG 24 hr tablet Take 50 mg by mouth daily.    [provider]  mirtazapine (REMERON) 7.5 MG tablet Take 7.5 mg by mouth at bedtime. Patient not taking: Reported on 01/05/2023  12/17/21   [provider]  Multiple Vitamin (MULTIVITAMIN WITH MINERALS) TABS tablet Take 1 tablet by mouth daily. 07/30/21   Meredeth Ide, MD  olopatadine (PATADAY) 0.1 % ophthalmic solution Place 1 drop into both eyes 2 (two) times daily.    [provider]  omeprazole (PRILOSEC) 20 MG capsule Take 20 mg by mouth daily before breakfast.    [provider]  oxybutynin (DITROPAN-XL) 5 MG 24 hr tablet Take 5 mg by mouth in the morning.    [provider]  rosuvastatin (CRESTOR) 10 MG tablet Take 10 mg by mouth every evening.    [provider]  sennosides-docusate sodium (SENOKOT-S) 8.6-50 MG tablet Take 1 tablet by mouth in the morning and at bedtime. Patient not taking: Reported on 01/05/2023    [provider]  thiamine 100 MG tablet Take 1 tablet (100 mg total) by mouth daily. 07/30/21   Meredeth Ide, MD  tiotropium (SPIRIVA HANDIHALER) 18 MCG inhalation capsule Place 18 mcg into inhaler and inhale daily as needed (Shortness of breath).    [provider]  traMADol (ULTRAM) 50 MG tablet Take 0.5 tablets by mouth every 6 (six) hours as needed for moderate pain or severe pain.    [provider]  zinc sulfate 220 (50 Zn) MG capsule Take 1 capsule (220 mg total) by mouth daily. Patient not taking: Reported on 01/05/2023 12/08/20   Shon Hale, MD  albuterol (PROAIR HFA) 108 (90 Base) MCG/ACT inhaler INHALE 2 PUFFS EVERY 6 HOURS AS NEEDED FOR SHORTNESS OF BREATH/WHEEZING. Patient not taking: Reported on 04/21/2021 08/05/20 04/21/21  Kerri Perches, MD      Allergies    Sodium hypochlorite    Review of Systems   Review of Systems  Musculoskeletal:  Positive for back pain.    Physical Exam Updated Vital Signs BP (!) 171/107   Pulse 78   Temp 97.9 F (36.6 C) (Oral)   Resp 18   Ht 5\' 1"  (1.549 m)   Wt 76 kg   LMP 05/19/2016   SpO2 100%   BMI 31.66 kg/m  Physical Exam Constitutional:      Appearance:  Normal appearance.  HENT:     Head: Normocephalic.     Nose: Nose normal.  Eyes:     Conjunctiva/sclera: Conjunctivae normal.  Pulmonary:     Effort: Pulmonary effort is normal.  Musculoskeletal:       Back:     Comments: Able to take a few steps around the room with my assistance.  Did not elicit pain with gait.  Neurological:     Mental Status: She is alert.  Psychiatric:        Mood and Affect: Mood normal.  ED Results / Procedures / Treatments   Labs (all labs ordered are listed, but only abnormal results are displayed) Labs Reviewed - No data to display  EKG None  Radiology MR LUMBAR SPINE WO CONTRAST  Result Date: 04/12/2023 CLINICAL DATA:  Low back pain with lower extremity edema. EXAM: MRI LUMBAR SPINE WITHOUT CONTRAST TECHNIQUE: Multiplanar, multisequence MR imaging of the lumbar spine was performed. No intravenous contrast was administered. COMPARISON:  Lumbar spine CT 04/12/2023 and MRI 06/21/2017 FINDINGS: Segmentation:  Standard. Alignment: Grade 1 anterolisthesis of L3 on L4 and L4 on L5 and trace retrolisthesis of L5 on S1. Vertebrae: Chronic L1 compression fracture. Marrow edema in the sacral ala bilaterally consistent with insufficiency type fractures, with a transverse component extending across the midline through the anterior S3 segment. Multilevel degenerative endplate changes, including mild degenerative edema at L4-5. Conus medullaris and cauda equina: Conus extends to the L2 level. Conus and cauda equina appear normal. Paraspinal and other soft tissues: Multiple small uterine fibroids as previously seen. Mild chronic bladder wall thickening. Disc levels: Disc desiccation throughout the lumbar spine. Severe disc space narrowing at L3-4 and L5-S1 and moderate narrowing at L4-5. T11-12: Only imaged sagittally. Disc bulging and moderate facet arthrosis result in mild right and moderate left neural foraminal stenosis and at most mild spinal stenosis, similar to the  prior MRI. T12-L1: Mild disc bulging, new from the prior MRI. Mild to moderate facet arthrosis. No significant stenosis. L1-2: Mild disc bulging, new from the prior MRI. Mild facet hypertrophy. No significant stenosis. L2-3: Disc bulging and moderate facet and ligamentum flavum hypertrophy without significant stenosis, similar to the prior MRI. L3-4: Anterolisthesis with left eccentric bulging of uncovered disc and moderate to severe facet and ligamentum flavum hypertrophy result in mild-to-moderate spinal stenosis, mild-to-moderate bilateral lateral recess stenosis, and moderate right and moderate to severe left neural foraminal stenosis. Findings have improved from the prior MRI, including regression of the left-sided disc extrusion. L4-5: Anterolisthesis with left eccentric bulging of uncovered disc and moderate to severe facet and ligamentum flavum hypertrophy result in mild spinal stenosis, mild right and moderate left lateral recess stenosis, and moderate to severe right and severe left neural foraminal stenosis, similar to the prior MRI. L5-S1: Disc bulging and moderate facet hypertrophy result in moderate bilateral neural foraminal stenosis without spinal stenosis, similar to the prior MRI. IMPRESSION: 1. Acute bilateral sacral fractures. 2. Advanced multilevel lumbar disc and facet degeneration. 3. Mild-to-moderate spinal stenosis and moderate to severe neural foraminal stenosis at L3-4, improved from 2018. 4. Mild spinal stenosis and moderate to severe neural foraminal stenosis at L4-5, unchanged. 5. Moderate bilateral neural foraminal stenosis at L5-S1. Electronically Signed   By: Sebastian Ache M.D.   On: 04/12/2023 16:42   CT Lumbar Spine Wo Contrast  Result Date: 04/12/2023 CLINICAL DATA:  Low back pain, cauda equina syndrome suspected EXAM: CT LUMBAR SPINE WITHOUT CONTRAST TECHNIQUE: Multidetector CT imaging of the lumbar spine was performed without intravenous contrast administration. Multiplanar  CT image reconstructions were also generated. RADIATION DOSE REDUCTION: This exam was performed according to the departmental dose-optimization program which includes automated exposure control, adjustment of the mA and/or kV according to patient size and/or use of iterative reconstruction technique. COMPARISON:  CT L Spine 09/08/22 FINDINGS: Segmentation: 5 lumbar type vertebrae. Alignment: Grade 1 anterolisthesis of L3 on L4 and L4 on L5, unchanged from prior exam. Vertebrae: Unchanged superior endplate compression deformity at L1. Paraspinal and other soft tissues: Atherosclerotic vascular calcifications. Disc levels: There  is likely at least moderate spinal canal stenosis at L3-L4. There is likely severe bilateral lateral recess and neural foraminal narrowing at L4-L5. these findings appear similar to 09/08/2022.2 IMPRESSION: 1. No acute fracture or traumatic listhesis. 2. Unchanged superior endplate compression deformity at L1. 3. Likely at least moderate spinal canal stenosis at L3-L4. Likely severe bilateral lateral recess and neural foraminal narrowing at L4-L5. These findings appear similar to 09/08/2022.2. If there is clinical concern for cauda equina syndrome, further evaluation with MRI is recommended. Electronically Signed   By: Lorenza Cambridge M.D.   On: 04/12/2023 14:43    Procedures Procedures    Medications Ordered in ED Medications  predniSONE (DELTASONE) tablet 60 mg (has no administration in time range)  HYDROcodone-acetaminophen (NORCO/VICODIN) 5-325 MG per tablet 1 tablet (1 tablet Oral Given 04/12/23 1417)    ED Course/ Medical Decision Making/ A&P                             Medical Decision Making Amount and/or Complexity of Data Reviewed Radiology: ordered.  Risk Prescription drug management.   57 year old well-appearing female presenting for lower back pain.  Exam was remarkable for midline cervical tenderness.  DDx includes sacral contusion, cauda equina syndrome,  fracture dislocation.  MRI revealed acute sacral fractures but overall improved stenosis.  Discussed patient with Dr. August Saucer orthopedics who advised to start on prednisone and have her follow-up outpatient if she is able to ambulate as she normally does.  I was able to ambulate her around the room with assistance.  And patient also endorses that her capacity to ambulate with assistance has remained unchanged since her fall.  Considered cauda equina but unlikely given evidence of improved stenosis on MRI and patient denies red flag symptoms aside from the lower extremity numbness.  Advised her to follow-up in orthopedic clinic.  Started her on prednisone.  Also persistently hypertensive for this encounter.  Started her on amlodipine and sent a 30-day supply to her pharmacy. Discussed return precautions.  Vitals remained stable.  Discharged home.        Final Clinical Impression(s) / ED Diagnoses Final diagnoses:  Closed fracture of sacrum, unspecified portion of sacrum, initial encounter [facility])    Rx / DC Orders ED Discharge Orders     None         Gareth Eagle, PA-C 04/12/23 1743    Gareth Eagle, PA-C 04/12/23 1750    Gloris Manchester, MD 04/13/23 1825

## 2023-04-12 NOTE — Discharge Instructions (Addendum)
Evaluation today revealed you have acute sacral fractures.  I spoke to orthopedics who advised that you are okay to go home at this time.  Recommended a steroid pack.  Also recommend you follow-up in their office.  Contact patient is in your discharge summary.  If you have new urinary or bowel incontinence, worsening numbness, changes in your gait or any other concerning symptom please return emerged part for evaluation.

## 2023-04-12 NOTE — ED Notes (Signed)
EDP made aware of pt's elevated BP.   ?

## 2023-04-12 NOTE — ED Provider Notes (Signed)
[place] EMERGENCY DEPARTMENT AT [facility]  Provider Note  CSN: [id number] Arrival date & time: 04/12/23 2221  History Chief Complaint  Patient presents with   Hypertension    [name] is a 57 y.o. female with history of afib, stroke, CKD, HTN was in the ED on 6/8 for a fall and contusion of sacrum. Was back here earlier today for re-evaluation and ultimately discharged back to her LTCF where apparently her BP was elevated. She is having some back pain but denies any other complaints.    Home Medications Prior to Admission medications   Medication Sig Start Date End Date Taking? Authorizing Provider  ACCU-CHEK GUIDE test strip USE TO CHECK BLOOD SUGAR WITH MEALS AND AT BEDTIME. 03/15/23   Del Newman Nip, Tenna Child, FNP  acetaminophen (TYLENOL) 325 MG tablet Take 2 tablets (650 mg total) by mouth every 6 (six) hours as needed for headache, fever or mild pain. Patient taking differently: Take 650 mg by mouth every 6 (six) hours as needed for fever (or pain). 11/04/20   Johnson, Clanford L, MD  ALPRAZolam Prudy Feeler) 0.5 MG tablet Take 0.5 mg by mouth 2 (two) times daily.    [provider]  amLODipine (NORVASC) 5 MG tablet Take 1 tablet (5 mg total) by mouth daily. 04/12/23   Gareth Eagle, PA-C  ammonium lactate (AMLACTIN) 12 % lotion Apply 1 Application topically as needed for dry skin. 01/05/23   Del Nigel Berthold, FNP  ascorbic acid (VITAMIN C) 500 MG tablet Take 500 mg by mouth 2 (two) times daily.    [provider]  aspirin EC 81 MG EC tablet Take 1 tablet (81 mg total) by mouth daily. Swallow whole. 05/28/21   Rolly Salter, MD  BD AUTOSHIELD DUO 30G X 5 MM MISC USE TO INJECT INSULIN WITH MEALS AND AT BEDTIME. 04/09/23   Del Newman Nip, Tenna Child, FNP  benztropine (COGENTIN) 0.5 MG tablet Take 1 tablet (0.5 mg total) by mouth 2 (two) times daily. 05/27/21   Rolly Salter, MD  cetirizine (ZYRTEC ALLERGY) 10 MG tablet Take 1 tablet (10 mg total)  by mouth daily. 09/08/22   Roxy Horseman, PA-C  Dextromethorphan-guaiFENesin (GERI-TUSSIN DM) 10-100 MG/5ML liquid Take 10 mLs by mouth every 12 (twelve) hours.    [provider]  ELIQUIS 5 MG TABS tablet Take 5 mg by mouth 2 (two) times daily. 06/24/21   [provider]  feeding supplement (ENSURE ENLIVE / ENSURE PLUS) LIQD Take 237 mLs by mouth 3 (three) times daily between meals. 07/29/21   Meredeth Ide, MD  FLUoxetine (PROZAC) 40 MG capsule Take 2 capsules by mouth daily. 11/11/21   [provider]  HUMALOG KWIKPEN 100 UNIT/ML KwikPen Inject 1-10 Units into the skin See admin instructions. Inject 1-10 units into the skin before meals and at bedtime, PER SLIDING SCALE: BGL 151-200 = 1 unit; 201-250 = 2 units; 251-300 = 4 units; 301-350 = 6 units; 351-400 = 8 units; 401-450 = 10 units 03/28/21   [provider]  Lancets Misc. MISC 1 each by Does not apply route in the morning, at noon, and at bedtime. May substitute to any manufacturer covered by patient's insurance. 03/15/23 04/14/23  Del Nigel Berthold, FNP  levETIRAcetam (KEPPRA) 500 MG tablet Take 1 tablet (500 mg total) by mouth 2 (two) times daily. 07/29/21   Meredeth Ide, MD  loperamide (IMODIUM A-D) 2 MG tablet Take 4 mg by mouth 4 (four)  times daily as needed for diarrhea or loose stools.    [provider]  metoprolol succinate (TOPROL-XL) 25 MG 24 hr tablet Take 50 mg by mouth daily.    [provider]  mirtazapine (REMERON) 7.5 MG tablet Take 7.5 mg by mouth at bedtime. Patient not taking: Reported on 01/05/2023 12/17/21   [provider]  Multiple Vitamin (MULTIVITAMIN WITH MINERALS) TABS tablet Take 1 tablet by mouth daily. 07/30/21   Meredeth Ide, MD  olopatadine (PATADAY) 0.1 % ophthalmic solution Place 1 drop into both eyes 2 (two) times daily.    [provider]  omeprazole (PRILOSEC) 20 MG capsule Take 20 mg by mouth daily before breakfast.    [provider]  oxybutynin (DITROPAN-XL) 5 MG 24 hr tablet Take 5 mg by mouth in the morning.    [provider]  predniSONE (STERAPRED UNI-PAK 21 TAB) 10 MG (21) TBPK tablet Take by mouth daily. Take 6 tabs by mouth daily  for 2 days, then 5 tabs for 2 days, then 4 tabs for 2 days, then 3 tabs for 2 days, 2 tabs for 2 days, then 1 tab by mouth daily for 2 days 04/12/23   Gareth Eagle, PA-C  rosuvastatin (CRESTOR) 10 MG tablet Take 10 mg by mouth every evening.    [provider]  sennosides-docusate sodium (SENOKOT-S) 8.6-50 MG tablet Take 1 tablet by mouth in the morning and at bedtime. Patient not taking: Reported on 01/05/2023    [provider]  thiamine 100 MG tablet Take 1 tablet (100 mg total) by mouth daily. 07/30/21   Meredeth Ide, MD  tiotropium (SPIRIVA HANDIHALER) 18 MCG inhalation capsule Place 18 mcg into inhaler and inhale daily as needed (Shortness of breath).    [provider]  traMADol (ULTRAM) 50 MG tablet Take 0.5 tablets by mouth every 6 (six) hours as needed for moderate pain or severe pain.    [provider]  zinc sulfate 220 (50 Zn) MG capsule Take 1 capsule (220 mg total) by mouth daily. Patient not taking: Reported on 01/05/2023 12/08/20   Shon Hale, MD  albuterol (PROAIR HFA) 108 (90 Base) MCG/ACT inhaler INHALE 2 PUFFS EVERY 6 HOURS AS NEEDED FOR SHORTNESS OF BREATH/WHEEZING. Patient not taking: Reported on 04/21/2021 08/05/20 04/21/21  Kerri Perches, MD     Allergies    Sodium hypochlorite   Review of Systems   Review of Systems Please see HPI for pertinent positives and negatives  Physical Exam BP (!) 172/104   Pulse 76   Resp 20   Ht 5\' 1"  (1.549 m)   Wt 76 kg   LMP 05/19/2016   SpO2 99%   BMI 31.66 kg/m   Physical Exam Vitals and nursing note reviewed.  Constitutional:      Appearance: Normal appearance.  HENT:     Head: Normocephalic and atraumatic.     Nose: Nose normal.      Mouth/Throat:     Mouth: Mucous membranes are moist.  Eyes:     Extraocular Movements: Extraocular movements intact.     Conjunctiva/sclera: Conjunctivae normal.  Cardiovascular:     Rate and Rhythm: Normal rate.  Pulmonary:     Effort: Pulmonary effort is normal.     Breath sounds: Normal breath sounds.  Abdominal:     General: Abdomen is flat.     Palpations: Abdomen is soft.     Tenderness: There is no abdominal tenderness.  Musculoskeletal:  General: No swelling. Normal range of motion.     Cervical back: Neck supple.  Skin:    General: Skin is warm and dry.  Neurological:     General: No focal deficit present.     Mental Status: She is alert.  Psychiatric:        Mood and Affect: Mood normal.     ED Results / Procedures / Treatments   EKG None  Procedures Procedures  Medications Ordered in the ED Medications  HYDROcodone-acetaminophen (NORCO/VICODIN) 5-325 MG per tablet 1 tablet (1 tablet Oral Given 04/13/23 0000)    Initial Impression and Plan  Patient with known CKD among other medical problems here with asymptomatic HTN. Will check BMP to ensure no acute change in renal function. Norco for her MSK pain.   ED Course   Clinical Course as of 04/13/23 0044  Tue Apr 13, 2023  0043 BMP with CKD at baseline. Patient will be returned to LTCF to continue her usual treatment, recommend PCP follow up for further management of HTN [CS]    Clinical Course User Index [CS] Pollyann Savoy, MD     MDM Rules/Calculators/A&P Medical Decision Making Problems Addressed: Chronic kidney disease, unspecified CKD stage: chronic illness or injury Uncontrolled hypertension: chronic illness or injury  Amount and/or Complexity of Data Reviewed Labs: ordered. Decision-making details documented in ED Course.  Risk Prescription drug management.     Final Clinical Impression(s) / ED Diagnoses Final diagnoses:  Uncontrolled hypertension  Chronic kidney disease,  unspecified CKD stage    Rx / DC Orders ED Discharge Orders     None        Pollyann Savoy, MD 04/13/23 [phone]

## 2023-04-12 NOTE — ED Notes (Signed)
Pt transported to

## 2023-04-12 NOTE — ED Triage Notes (Signed)
Pt arrived to er from [facility] where staff had contacted ems with concerns of high blood pressure, upon ems arrival pt bp was 160/100, prior to arrival in ed bp was 149/93, pt only c/o is lower back pain from a recent fall that she has been seen for.

## 2023-04-12 NOTE — ED Notes (Signed)
Pt returned from CT °

## 2023-04-12 NOTE — ED Triage Notes (Signed)
Pt reports continued back pain and bilateral leg pain after falling on 04/03/2023.

## 2023-04-12 NOTE — ED Notes (Signed)
ED Provider at bedside. 

## 2023-04-12 NOTE — ED Notes (Signed)
Patient transported to CT 

## 2023-04-13 DIAGNOSIS — R6889 Other general symptoms and signs: Secondary | ICD-10-CM | POA: Diagnosis not present

## 2023-04-13 DIAGNOSIS — Z5189 Encounter for other specified aftercare: Secondary | ICD-10-CM | POA: Diagnosis not present

## 2023-04-13 DIAGNOSIS — S3210XA Unspecified fracture of sacrum, initial encounter for closed fracture: Secondary | ICD-10-CM | POA: Diagnosis not present

## 2023-04-13 DIAGNOSIS — Z743 Need for continuous supervision: Secondary | ICD-10-CM | POA: Diagnosis not present

## 2023-04-13 LAB — BASIC METABOLIC PANEL
Anion gap: 9 (ref 5–15)
BUN: 62 mg/dL — ABNORMAL HIGH (ref 6–20)
CO2: 17 mmol/L — ABNORMAL LOW (ref 22–32)
Calcium: 8.5 mg/dL — ABNORMAL LOW (ref 8.9–10.3)
Chloride: 111 mmol/L (ref 98–111)
Creatinine, Ser: 3.05 mg/dL — ABNORMAL HIGH (ref 0.44–1.00)
GFR, Estimated: 17 mL/min — ABNORMAL LOW (ref >60)
Glucose, Bld: 335 mg/dL — ABNORMAL HIGH (ref 70–99)
Potassium: 5.1 mmol/L (ref 3.5–5.1)
Sodium: 137 mmol/L (ref 135–145)

## 2023-04-13 NOTE — ED Notes (Signed)
Partitions placed around pt's bed for privacy

## 2023-04-13 NOTE — ED Notes (Signed)
Notified [place] [facility] of patient needing transportation back to [facility] 

## 2023-04-14 DIAGNOSIS — D638 Anemia in other chronic diseases classified elsewhere: Secondary | ICD-10-CM | POA: Diagnosis not present

## 2023-04-14 DIAGNOSIS — I5032 Chronic diastolic (congestive) heart failure: Secondary | ICD-10-CM | POA: Diagnosis not present

## 2023-04-14 DIAGNOSIS — E1129 Type 2 diabetes mellitus with other diabetic kidney complication: Secondary | ICD-10-CM | POA: Diagnosis not present

## 2023-04-14 DIAGNOSIS — R809 Proteinuria, unspecified: Secondary | ICD-10-CM | POA: Diagnosis not present

## 2023-04-15 ENCOUNTER — Encounter: Payer: Self-pay | Admitting: Internal Medicine

## 2023-04-15 ENCOUNTER — Ambulatory Visit (INDEPENDENT_AMBULATORY_CARE_PROVIDER_SITE_OTHER): Payer: 59 | Admitting: Internal Medicine

## 2023-04-15 VITALS — BP 169/107 | HR 82 | Ht 61.0 in | Wt 167.0 lb

## 2023-04-15 DIAGNOSIS — E559 Vitamin D deficiency, unspecified: Secondary | ICD-10-CM

## 2023-04-15 DIAGNOSIS — I10 Essential (primary) hypertension: Secondary | ICD-10-CM | POA: Diagnosis not present

## 2023-04-15 DIAGNOSIS — E785 Hyperlipidemia, unspecified: Secondary | ICD-10-CM

## 2023-04-15 DIAGNOSIS — N184 Chronic kidney disease, stage 4 (severe): Secondary | ICD-10-CM

## 2023-04-15 DIAGNOSIS — S3210XD Unspecified fracture of sacrum, subsequent encounter for fracture with routine healing: Secondary | ICD-10-CM

## 2023-04-15 DIAGNOSIS — S3210XA Unspecified fracture of sacrum, initial encounter for closed fracture: Secondary | ICD-10-CM | POA: Insufficient documentation

## 2023-04-15 MED ORDER — ROSUVASTATIN CALCIUM 20 MG PO TABS
20.0000 mg | ORAL_TABLET | Freq: Every day | ORAL | 3 refills | Status: DC
Start: 2023-04-15 — End: 2023-09-07

## 2023-04-15 MED ORDER — AMLODIPINE BESYLATE 10 MG PO TABS
10.0000 mg | ORAL_TABLET | Freq: Every day | ORAL | 1 refills | Status: DC
Start: 2023-04-15 — End: 2023-05-20

## 2023-04-15 MED ORDER — VITAMIN D (ERGOCALCIFEROL) 1.25 MG (50000 UNIT) PO CAPS
50000.0000 [IU] | ORAL_CAPSULE | ORAL | 0 refills | Status: AC
Start: 2023-04-15 — End: 2023-06-04

## 2023-04-15 NOTE — Assessment & Plan Note (Signed)
She fell earlier this month and suffered acute bilateral sacral fractures.  Recently evaluated by orthopedic surgery in the emergency department and was discharged on prednisone, which she started yesterday.  She has an appointment with orthopedic surgery (Dr. Romeo Apple) scheduled for Monday (6/24).

## 2023-04-15 NOTE — Progress Notes (Signed)
Established Patient Office Visit  Subjective   Patient ID: [name], female    DOB: [birth year]  Age: 57 y.o. MRN: [id number]  Chief Complaint  Patient presents with   Tailbone Pain    Bruise on tailbone   [name] returns to care today for routine follow-up.  She was last evaluated at [facility] on 3/12 as a new patient presenting to establish care.  No medication changes made at that time and 32-month follow-up was arranged.  In the interim, labs were obtained and she was urgently referred to nephrology in the setting of CKD stage IV as well as severe proteinuria.  She also presented to the emergency department on 6/8 endorsing low back pain in the setting of recent fall.  She returned to the emergency department on 6/17 for persistent low back pain.  MRI subsequently demonstrated acute bilateral sacral fractures.  She was evaluated by orthopedic surgery, prescribed prednisone, and discharged home.  She returned to the emergency department later that same evening (6/17) due to HTN.  Labs were stable, pain medication was prescribed, and she was discharged home.  There have otherwise been no acute interval events.  Today [name] continues to endorse low back pain.  Her additional concern is hypertension as her blood pressure is significantly elevated today.  Past Medical History:  Diagnosis Date   Acute lower UTI 06/04/2018   Acute metabolic encephalopathy 03/11/2021   Acute on chronic respiratory failure with hypoxia (HCC) 07/20/2021   Acute respiratory failure requiring reintubation (HCC) 11/09/2012   Acute respiratory failure with hypoxia (HCC) 12/03/2020   AKI (acute kidney injury) (HCC) 06/04/2018   Alcohol use    Ankle fracture, lateral malleolus, closed 2013   Anxiety    Aortic atherosclerosis (HCC) 03/10/2021   Atherosclerosis of native artery of both legs with gangrene (HCC)    Breast mass, left 2013   CHF (congestive heart failure) (HCC)    a. EF 20-25% by echo in 05/2016  with cath showing normal cors b. EF 50-55% in 07/2020 c. 01/2021: EF at 55-60% with moderate LVH   Chronic anemia    CKD (chronic kidney disease), stage III (HCC)    Cocaine abuse (HCC)    COPD (chronic obstructive pulmonary disease) (HCC)    Diabetes mellitus, type 2 (HCC)    Diabetic Charcot foot (HCC)    DKA (diabetic ketoacidosis) (HCC) 10/29/2020   Essential hypertension    H/O open hand wound 11/22/2018   History of cardiomyopathy    History of GI bleed 2011   Hyperlipidemia    Lactic acid acidosis 09/11/2014   Metabolic encephalopathy 03/11/2021   Noncompliance    Obesity    Oral candidiasis 03/10/2021   PAF (paroxysmal atrial fibrillation) (HCC)    Panic attacks    PAT (paroxysmal atrial tachycardia)    Previously on Amiodarone   Pneumonia due to COVID-19 virus 12/03/2020   Seizures (HCC) 10/01/2020   Sepsis due to skin infection (HCC) 04/01/2021   Shock (HCC) 10/29/2020   Sleep apnea    Not on CPAP   Stroke (HCC) 2018   Thrombocytosis 08/02/2017   Tobacco abuse    UGI bleed    Urinary incontinence    Past Surgical History:  Procedure Laterality Date   ANGIOPLASTY ILLIAC ARTERY Right 04/23/2021   Procedure: ANGIOPLASTY AND STENT SUPERFICIAL FEMORAL ARTERY;  Surgeon: Leonie Douglas, MD;  Location: [facility] OR;  Service: Vascular;  Laterality: Right;   AORTOGRAM  04/23/2021   Procedure: AORTOGRAM;  Surgeon: Leonie Douglas, MD;  Location: [facility] OR;  Service: Vascular;;   AORTOGRAM Left 04/30/2021   Procedure: left lower extremity angiogram with second order cannulation;  Surgeon: Leonie Douglas, MD;  Location: [facility] OR;  Service: Vascular;  Laterality: Left;   BIOPSY  11/12/2020   Procedure: BIOPSY;  Surgeon: Dolores Frame, MD;  Location: [facility] SUITE;  Service: Gastroenterology;;   BREAST BIOPSY     CARDIAC CATHETERIZATION N/A 07/28/2016   Procedure: Left Heart Cath and Coronary Angiography;  Surgeon: Corky Crafts, MD;  Location: [facility] INVASIVE CV LAB;   Service: Cardiovascular;  Laterality: N/A;   COLONOSCOPY N/A 05/10/2019   Procedure: COLONOSCOPY;  Surgeon: West Bali, MD;  Location: [facility];  Service: Endoscopy;  Laterality: N/A;  Phenergan 12.5 mg IV in pre-op   DILATION AND CURETTAGE OF UTERUS     ESOPHAGOGASTRODUODENOSCOPY (EGD) WITH PROPOFOL N/A 11/12/2020   Procedure: ESOPHAGOGASTRODUODENOSCOPY (EGD) WITH PROPOFOL;  Surgeon: Dolores Frame, MD;  Location: [facility];  Service: Gastroenterology;  Laterality: N/A;   I & D EXTREMITY Bilateral 09/22/2017   Procedure: BILATERAL DEBRIDEMENT LEG/FOOT ULCERS, APPLY VERAFLO WOUND VAC;  Surgeon: Nadara Mustard, MD;  Location: [facility] OR;  Service: Orthopedics;  Laterality: Bilateral;   I & D EXTREMITY Right 10/11/2018   Procedure: IRRIGATION AND DEBRIDEMENT RIGHT HAND;  Surgeon: Dominica Severin, MD;  Location: [facility] OR;  Service: Orthopedics;  Laterality: Right;   I & D EXTREMITY Right 10/13/2018   Procedure: REPEAT IRRIGATION AND DEBRIDEMENT RIGHT HAND;  Surgeon: Dominica Severin, MD;  Location: [facility] OR;  Service: Orthopedics;  Laterality: Right;   I & D EXTREMITY Right 11/22/2018   Procedure: IRRIGATION AND DEBRIDEMENT AND PINNING RIGHT HAND;  Surgeon: Dominica Severin, MD;  Location: [facility] OR;  Service: Orthopedics;  Laterality: Right;   IR RADIOLOGIST EVAL & MGMT  07/05/2018   LOWER EXTREMITY ANGIOGRAM Bilateral 04/23/2021   Procedure: RIGHT  AND LEFT LOWER EXTREMITY ANGIOGRAM;  Surgeon: Leonie Douglas, MD;  Location: [facility] OR;  Service: Vascular;  Laterality: Bilateral;   POLYPECTOMY  05/10/2019   Procedure: POLYPECTOMY;  Surgeon: West Bali, MD;  Location: [facility];  Service: Endoscopy;;   SKIN SPLIT GRAFT Bilateral 09/28/2017   Procedure: BILATERAL SPLIT THICKNESS SKIN GRAFT LEGS/FEET AND APPLY VAC;  Surgeon: Nadara Mustard, MD;  Location: [facility] OR;  Service: Orthopedics;  Laterality: Bilateral;   SKIN SPLIT GRAFT Right 11/22/2018   Procedure: SKIN GRAFT SPLIT THICKNESS;  Surgeon:  Dominica Severin, MD;  Location: [facility] OR;  Service: Orthopedics;  Laterality: Right;   Social History   Tobacco Use   Smoking status: Former    Packs/day: 0.25    Years: 20.00    Additional pack years: 0.00    Total pack years: 5.00    Types: Cigarettes    Quit date: 06/03/2017    Years since quitting: 5.8    Passive exposure: Never   Smokeless tobacco: Never  Vaping Use   Vaping Use: Never used  Substance Use Topics   Alcohol use: Not Currently    Alcohol/week: 0.0 standard drinks of alcohol    Comment: Quit in 2017   Drug use: Not Currently    Frequency: 3.0 times per week    Types: Marijuana, Cocaine    Comment: none since August 2018   Family History  Problem Relation Age of Onset   Hypertension Mother    Heart attack Mother    Hypertension Father  CABG    Hypertension Sister    Hypertension Brother    Hypertension Sister    Cancer Sister        breast    Arthritis Other    Cancer Other    Diabetes Other    Asthma Other    Hypertension Daughter    Hypertension Son    Allergies  Allergen Reactions   Sodium Hypochlorite Anaphylaxis    (Bleach wipes) "I am not able to breathe"   Review of Systems  Musculoskeletal:  Positive for back pain.  All other systems reviewed and are negative.    Objective:     BP (!) 169/107   Pulse 82   Ht 5\' 1"  (1.549 m)   Wt 167 lb (75.8 kg)   LMP 05/19/2016   SpO2 91%   BMI 31.55 kg/m  BP Readings from Last 3 Encounters:  04/15/23 (!) 169/107  04/13/23 (!) 166/109  04/12/23 (!) 196/117    Physical Exam Vitals reviewed.  Constitutional:      General: She is not in acute distress.    Appearance: Normal appearance. She is obese. She is ill-appearing (Chronically ill-appearing). She is not toxic-appearing.     Comments: Examined in wheelchair  HENT:     Head: Normocephalic and atraumatic.     Right Ear: External ear normal.     Left Ear: External ear normal.     Nose: Nose normal. No congestion or rhinorrhea.      Mouth/Throat:     Mouth: Mucous membranes are moist.     Pharynx: Oropharynx is clear. No oropharyngeal exudate or posterior oropharyngeal erythema.  Eyes:     General: No scleral icterus.    Extraocular Movements: Extraocular movements intact.     Conjunctiva/sclera: Conjunctivae normal.     Pupils: Pupils are equal, round, and reactive to light.  Cardiovascular:     Rate and Rhythm: Normal rate and regular rhythm.     Pulses: Normal pulses.     Heart sounds: Normal heart sounds. No murmur heard.    No friction rub. No gallop.  Pulmonary:     Effort: Pulmonary effort is normal.     Breath sounds: Normal breath sounds. No wheezing, rhonchi or rales.  Abdominal:     General: Abdomen is flat. Bowel sounds are normal. There is no distension.     Palpations: Abdomen is soft.     Tenderness: There is no abdominal tenderness.  Musculoskeletal:        General: No swelling.     Cervical back: Normal range of motion.     Right lower leg: No edema.     Left lower leg: No edema.  Lymphadenopathy:     Cervical: No cervical adenopathy.  Skin:    General: Skin is warm and dry.     Capillary Refill: Capillary refill takes less than 2 seconds.     Coloration: Skin is not jaundiced.  Neurological:     General: No focal deficit present.     Mental Status: She is alert and oriented to person, place, and time.  Psychiatric:        Mood and Affect: Mood normal.        Behavior: Behavior normal.   Last CBC Lab Results  Component Value Date   WBC 5.7 03/31/2023   HGB 10.8 (L) 03/31/2023   HCT 34.1 03/31/2023   MCV 93 03/31/2023   MCH 29.6 03/31/2023   RDW 13.5 03/31/2023   PLT 208 03/31/2023  Last metabolic panel Lab Results  Component Value Date   GLUCOSE 335 (H) 04/13/2023   NA 137 04/13/2023   K 5.1 04/13/2023   CL 111 04/13/2023   CO2 17 (L) 04/13/2023   BUN 62 (H) 04/13/2023   CREATININE 3.05 (H) 04/13/2023   GFRNONAA 17 (L) 04/13/2023   CALCIUM 8.5 (L) 04/13/2023    PHOS 2.9 07/22/2021   PROT 5.5 (L) 03/31/2023   ALBUMIN 3.3 (L) 03/31/2023   LABGLOB 2.2 03/31/2023   AGRATIO 1.5 03/31/2023   BILITOT <0.2 03/31/2023   ALKPHOS 120 03/31/2023   AST 29 03/31/2023   ALT 34 (H) 03/31/2023   ANIONGAP 9 04/13/2023   Last lipids Lab Results  Component Value Date   CHOL 184 03/31/2023   HDL 60 03/31/2023   LDLCALC 108 (H) 03/31/2023   LDLDIRECT 122 (H) 01/08/2010   TRIG 86 03/31/2023   CHOLHDL 3.1 03/31/2023   Last hemoglobin A1c Lab Results  Component Value Date   HGBA1C 6.1 (H) 03/31/2023   Last thyroid functions Lab Results  Component Value Date   TSH 1.330 03/31/2023   Last vitamin D Lab Results  Component Value Date   VD25OH 10.8 (L) 03/31/2023   Last vitamin B12 and Folate Lab Results  Component Value Date   VITAMINB12 696 05/06/2021   FOLATE 13.4 05/06/2021     Assessment & Plan:   Problem List Items Addressed This Visit       HTN (hypertension), malignant - Primary (Chronic)    Poorly controlled.  BP is elevated today.  She is currently prescribed amlodipine 5 mg daily and metoprolol succinate 50 mg daily.  She has previously been well-controlled on this regimen.  Suspect today's elevated readings are attributable to current steroid use as well as pain. -Increase amlodipine to 10 mg daily -Follow-up in 4 weeks for HTN check      Sacral fracture (HCC)    She fell earlier this month and suffered acute bilateral sacral fractures.  Recently evaluated by orthopedic surgery in the emergency department and was discharged on prednisone, which she started yesterday.  She has an appointment with orthopedic surgery (Dr. Romeo Apple) scheduled for Monday (6/24).      Chronic kidney disease (CKD), stage IV (severe) (HCC)    Recent labs are consistent with CKD stage IV.  Urine microalbumin/creatinine ratio from earlier this month is severely elevated.  She has been referred to nephrology but does not have an appointment scheduled. -We  will call [facility] nephrology to schedule an appointment as soon as possible      Vitamin D deficiency    Noted on recent labs.  High-dose, weekly vitamin D supplementation has been prescribed today      Hyperlipidemia LDL goal <70    LDL goal < 55 in the setting of DM and CVA.  Lipid panel updated earlier this month with LDL 108.  She is currently prescribed rosuvastatin 10 mg daily. -Increase rosuvastatin to 20 mg daily       Return in about 4 weeks (around 05/13/2023).    Billie Lade, MD

## 2023-04-15 NOTE — Assessment & Plan Note (Signed)
LDL goal < 55 in the setting of DM and CVA.  Lipid panel updated earlier this month with LDL 108.  She is currently prescribed rosuvastatin 10 mg daily. -Increase rosuvastatin to 20 mg daily

## 2023-04-15 NOTE — Patient Instructions (Signed)
It was a pleasure to see you today.  Thank you for giving [name] the opportunity to be involved in your care.  Below is a brief recap of your visit and next steps.  We will plan to see you again in 4 weeks.  Summary Increase amlodipine to 10 mg daily Increase rosuvastatin to 20 mg daily Start weekly vitamin D supplementation Follow up in 4 weeks Orthopedic surgery Monday It is imperative that you schedule an appointment with nephrology

## 2023-04-15 NOTE — Assessment & Plan Note (Signed)
Noted on recent labs.  High-dose, weekly vitamin D supplementation has been prescribed today

## 2023-04-15 NOTE — Assessment & Plan Note (Signed)
Poorly controlled.  BP is elevated today.  She is currently prescribed amlodipine 5 mg daily and metoprolol succinate 50 mg daily.  She has previously been well-controlled on this regimen.  Suspect today's elevated readings are attributable to current steroid use as well as pain. -Increase amlodipine to 10 mg daily -Follow-up in 4 weeks for HTN check

## 2023-04-15 NOTE — Assessment & Plan Note (Signed)
Recent labs are consistent with CKD stage IV.  Urine microalbumin/creatinine ratio from earlier this month is severely elevated.  She has been referred to nephrology but does not have an appointment scheduled. -We will call [facility] nephrology to schedule an appointment as soon as possible

## 2023-04-16 DIAGNOSIS — I5032 Chronic diastolic (congestive) heart failure: Secondary | ICD-10-CM | POA: Diagnosis not present

## 2023-04-16 DIAGNOSIS — D638 Anemia in other chronic diseases classified elsewhere: Secondary | ICD-10-CM | POA: Diagnosis not present

## 2023-04-16 DIAGNOSIS — E1129 Type 2 diabetes mellitus with other diabetic kidney complication: Secondary | ICD-10-CM | POA: Diagnosis not present

## 2023-04-16 DIAGNOSIS — R809 Proteinuria, unspecified: Secondary | ICD-10-CM | POA: Diagnosis not present

## 2023-04-19 ENCOUNTER — Encounter: Payer: Self-pay | Admitting: Orthopedic Surgery

## 2023-04-19 ENCOUNTER — Ambulatory Visit (INDEPENDENT_AMBULATORY_CARE_PROVIDER_SITE_OTHER): Payer: 59 | Admitting: Orthopedic Surgery

## 2023-04-19 VITALS — BP 163/104 | HR 78 | Ht 61.0 in | Wt 167.0 lb

## 2023-04-19 DIAGNOSIS — M545 Low back pain, unspecified: Secondary | ICD-10-CM | POA: Diagnosis not present

## 2023-04-19 DIAGNOSIS — S3210XD Unspecified fracture of sacrum, subsequent encounter for fracture with routine healing: Secondary | ICD-10-CM

## 2023-04-19 NOTE — Progress Notes (Signed)
Chief Complaint  Patient presents with   Back Pain    [name] approximately 04/02/23 injury to sacrum    57 year old female fell around June 7 after slipping on some water at the facility that she was living in  She went to the emergency room and a series of imaging was performed.  There was an insufficiency fracture of the sacrum and she was diagnosed with a fractured sacral  However, she is not hurting really in the sacrum she is hurting in the lower back  On June 8 she had an x-ray of her sacrum and coccyx and then on the 17th she had a CT of the lumbar spine and an MRI of the lumbar spine  Imaging #1 COMPARISON:  Lumbar spine CT 04/12/2023 and MRI 06/21/2017   FINDINGS: Segmentation:  Standard.   Alignment: Grade 1 anterolisthesis of L3 on L4 and L4 on L5 and trace retrolisthesis of L5 on S1.   Vertebrae: Chronic L1 compression fracture. Marrow edema in the sacral ala bilaterally consistent with insufficiency type fractures, with a transverse component extending across the midline through the anterior S3 segment. Multilevel degenerative endplate changes, including mild degenerative edema at L4-5.   Conus medullaris and cauda equina: Conus extends to the L2 level. Conus and cauda equina appear normal.   Paraspinal and other soft tissues: Multiple small uterine fibroids as previously seen. Mild chronic bladder wall thickening.   Disc levels:   Disc desiccation throughout the lumbar spine. Severe disc space narrowing at L3-4 and L5-S1 and moderate narrowing at L4-5.   T11-12: Only imaged sagittally. Disc bulging and moderate facet arthrosis result in mild right and moderate left neural foraminal stenosis and at most mild spinal stenosis, similar to the prior MRI.   T12-L1: Mild disc bulging, new from the prior MRI. Mild to moderate facet arthrosis. No significant stenosis.   L1-2: Mild disc bulging, new from the prior MRI. Mild facet hypertrophy. No significant stenosis.    L2-3: Disc bulging and moderate facet and ligamentum flavum hypertrophy without significant stenosis, similar to the prior MRI.   L3-4: Anterolisthesis with left eccentric bulging of uncovered disc and moderate to severe facet and ligamentum flavum hypertrophy result in mild-to-moderate spinal stenosis, mild-to-moderate bilateral lateral recess stenosis, and moderate right and moderate to severe left neural foraminal stenosis. Findings have improved from the prior MRI, including regression of the left-sided disc extrusion.   L4-5: Anterolisthesis with left eccentric bulging of uncovered disc and moderate to severe facet and ligamentum flavum hypertrophy result in mild spinal stenosis, mild right and moderate left lateral recess stenosis, and moderate to severe right and severe left neural foraminal stenosis, similar to the prior MRI.   L5-S1: Disc bulging and moderate facet hypertrophy result in moderate bilateral neural foraminal stenosis without spinal stenosis, similar to the prior MRI.   IMPRESSION: 1. Acute bilateral sacral fractures. 2. Advanced multilevel lumbar disc and facet degeneration. 3. Mild-to-moderate spinal stenosis and moderate to severe neural foraminal stenosis at L3-4, improved from 2018. 4. Mild spinal stenosis and moderate to severe neural foraminal stenosis at L4-5, unchanged. 5. Moderate bilateral neural foraminal stenosis at L5-S1.     Electronically Signed   By: Sebastian Ache M.D.   On: 04/12/2023 16:42  Image #2 COMPARISON:  CT L Spine 09/08/22   FINDINGS: Segmentation: 5 lumbar type vertebrae.   Alignment: Grade 1 anterolisthesis of L3 on L4 and L4 on L5, unchanged from prior exam.   Vertebrae: Unchanged superior endplate compression deformity at  L1.   Paraspinal and other soft tissues: Atherosclerotic vascular calcifications.   Disc levels: There is likely at least moderate spinal canal stenosis at L3-L4. There is likely severe bilateral  lateral recess and neural foraminal narrowing at L4-L5. these findings appear similar to 09/08/2022.2   IMPRESSION: 1. No acute fracture or traumatic listhesis. 2. Unchanged superior endplate compression deformity at L1. 3. Likely at least moderate spinal canal stenosis at L3-L4. Likely severe bilateral lateral recess and neural foraminal narrowing at L4-L5. These findings appear similar to 09/08/2022.2. If there is clinical concern for cauda equina syndrome, further evaluation with MRI is recommended.    Image #3 SACRUM AND COCCYX - 2+ VIEW   COMPARISON:  None Available.   FINDINGS: There is no evidence of fracture or other focal bone lesions. Degenerative changes are noted at L4-5 at L5-S1. Atherosclerotic calcification overlies the sacrum bilaterally on frontal projections.   IMPRESSION: 1. No acute bony abnormality. 2. Degenerative changes in the lower lumbar spine.     Electronically Signed   By: Kennith Center M.D.   On: 04/03/2023 09:20    Physical Exam Vitals and nursing note reviewed.  Constitutional:      General: She is awake.     Appearance: Normal appearance. She is obese.  HENT:     Head: Normocephalic and atraumatic.  Eyes:     General: No scleral icterus.       Right eye: No discharge.        Left eye: No discharge.     Extraocular Movements: Extraocular movements intact.     Conjunctiva/sclera: Conjunctivae normal.     Pupils: Pupils are equal, round, and reactive to light.  Cardiovascular:     Rate and Rhythm: Normal rate.     Pulses: Normal pulses.  Musculoskeletal:     Comments: L spine   The tenderness is at the waist not the sacrum     Skin:    General: Skin is warm and dry.     Capillary Refill: Capillary refill takes less than 2 seconds.  Neurological:     General: No focal deficit present.     Mental Status: She is alert and oriented to person, place, and time.     Gait: Gait abnormal.  Psychiatric:        Mood and Affect: Mood  normal.        Behavior: Behavior normal. Behavior is cooperative.        Thought Content: Thought content normal.        Judgment: Judgment normal.    Encounter Diagnoses  Name Primary?   Midline low back pain without sciatica, unspecified chronicity Yes   Closed fracture of sacrum with routine healing, unspecified portion of sacrum, subsequent encounter    .ASSESSMENTPLAN   Her exam to me shows that she has an insufficiency fracture on MRI but her clinical exam and history indicate lower back pain after the fall  Recommend physical therapy for back pain  The sacral fracture should heal themselves  Follow-up as needed

## 2023-04-20 ENCOUNTER — Telehealth: Payer: Self-pay

## 2023-04-20 NOTE — Telephone Encounter (Signed)
Transition Care Management Follow-up Telephone Call Date of discharge and from where: [name] 6/17 How have you been since you were released from the hospital? [name] much better patient lives in an ALF Any questions or concerns? No  Items Reviewed: Did the pt receive and understand the discharge instructions provided? Yes  Medications obtained and verified? Yes  Other? No  Any new allergies since your discharge? No  Dietary orders reviewed? No Do you have support at home? Yes  ALF    Follow up appointments reviewed: PCP Hospital f/u appt confirmed? Yes  Scheduled to see  on  @ . Specialist Hospital f/u appt confirmed? No  Scheduled to see  on  @ . Are transportation arrangements needed? No  If their condition worsens, is the pt aware to call PCP or go to the Emergency Dept.? Yes Was the patient provided with contact information for the PCP's office or ED? Yes Was to pt encouraged to call back with questions or concerns? Yes

## 2023-04-22 DIAGNOSIS — M6281 Muscle weakness (generalized): Secondary | ICD-10-CM | POA: Diagnosis not present

## 2023-04-26 DIAGNOSIS — R2689 Other abnormalities of gait and mobility: Secondary | ICD-10-CM | POA: Diagnosis not present

## 2023-04-30 DIAGNOSIS — R2689 Other abnormalities of gait and mobility: Secondary | ICD-10-CM | POA: Diagnosis not present

## 2023-05-03 ENCOUNTER — Ambulatory Visit (INDEPENDENT_AMBULATORY_CARE_PROVIDER_SITE_OTHER): Payer: 59 | Admitting: Family Medicine

## 2023-05-03 ENCOUNTER — Encounter: Payer: Self-pay | Admitting: Family Medicine

## 2023-05-03 VITALS — BP 144/94 | HR 74 | Ht 61.0 in | Wt 171.0 lb

## 2023-05-03 DIAGNOSIS — B349 Viral infection, unspecified: Secondary | ICD-10-CM

## 2023-05-03 MED ORDER — PROMETHAZINE-DM 6.25-15 MG/5ML PO SYRP
5.0000 mL | ORAL_SOLUTION | Freq: Four times a day (QID) | ORAL | 0 refills | Status: DC | PRN
Start: 2023-05-03 — End: 2023-05-20

## 2023-05-03 MED ORDER — FLUTICASONE PROPIONATE 50 MCG/ACT NA SUSP
2.0000 | Freq: Every day | NASAL | 6 refills | Status: AC
Start: 2023-05-03 — End: ?

## 2023-05-03 NOTE — Progress Notes (Signed)
Established Patient Office Visit  Subjective:  Patient ID: [name], female    DOB: 1966-01-24  Age: 57 y.o. MRN: [id number]  CC:  Chief Complaint  Patient presents with   URI    Pt reports feeling sick sx of cough and congestion for 3 days now.     HPI [name] is a 57 y.o. female  presents with c/o of cough and congestion for 3 days. S  Past Medical History:  Diagnosis Date   Acute lower UTI 06/04/2018   Acute metabolic encephalopathy 03/11/2021   Acute on chronic respiratory failure with hypoxia (HCC) 07/20/2021   Acute respiratory failure requiring reintubation (HCC) 11/09/2012   Acute respiratory failure with hypoxia (HCC) 12/03/2020   AKI (acute kidney injury) (HCC) 06/04/2018   Alcohol use    Ankle fracture, lateral malleolus, closed 2013   Anxiety    Aortic atherosclerosis (HCC) 03/10/2021   Atherosclerosis of native artery of both legs with gangrene (HCC)    Breast mass, left 2013   CHF (congestive heart failure) (HCC)    a. EF 20-25% by echo in 05/2016 with cath showing normal cors b. EF 50-55% in 07/2020 c. 01/2021: EF at 55-60% with moderate LVH   Chronic anemia    CKD (chronic kidney disease), stage III (HCC)    Cocaine abuse (HCC)    COPD (chronic obstructive pulmonary disease) (HCC)    Diabetes mellitus, type 2 (HCC)    Diabetic Charcot foot (HCC)    DKA (diabetic ketoacidosis) (HCC) 10/29/2020   Essential hypertension    H/O open hand wound 11/22/2018   History of cardiomyopathy    History of GI bleed 2011   Hyperlipidemia    Lactic acid acidosis 09/11/2014   Metabolic encephalopathy 03/11/2021   Noncompliance    Obesity    Oral candidiasis 03/10/2021   PAF (paroxysmal atrial fibrillation) (HCC)    Panic attacks    PAT (paroxysmal atrial tachycardia)    Previously on Amiodarone   Pneumonia due to COVID-19 virus 12/03/2020   Seizures (HCC) 10/01/2020   Sepsis due to skin infection (HCC) 04/01/2021   Shock (HCC) 10/29/2020    Sleep apnea    Not on CPAP   Stroke (HCC) 2018   Thrombocytosis 08/02/2017   Tobacco abuse    UGI bleed    Urinary incontinence     Past Surgical History:  Procedure Laterality Date   ANGIOPLASTY ILLIAC ARTERY Right 04/23/2021   Procedure: ANGIOPLASTY AND STENT SUPERFICIAL FEMORAL ARTERY;  Surgeon: Leonie Douglas, MD;  Location: [facility] OR;  Service: Vascular;  Laterality: Right;   AORTOGRAM  04/23/2021   Procedure: AORTOGRAM;  Surgeon: Leonie Douglas, MD;  Location: [facility] OR;  Service: Vascular;;   AORTOGRAM Left 04/30/2021   Procedure: left lower extremity angiogram with second order cannulation;  Surgeon: Leonie Douglas, MD;  Location: [facility] OR;  Service: Vascular;  Laterality: Left;   BIOPSY  11/12/2020   Procedure: BIOPSY;  Surgeon: Dolores Frame, MD;  Location: [facility];  Service: Gastroenterology;;   BREAST BIOPSY     CARDIAC CATHETERIZATION N/A 07/28/2016   Procedure: Left Heart Cath and Coronary Angiography;  Surgeon: Corky Crafts, MD;  Location: [facility] INVASIVE CV LAB;  Service: Cardiovascular;  Laterality: N/A;   COLONOSCOPY N/A 05/10/2019   Procedure: COLONOSCOPY;  Surgeon: West Bali, MD;  Location: [facility];  Service: Endoscopy;  Laterality: N/A;  Phenergan 12.5 mg IV in pre-op   DILATION AND CURETTAGE OF  UTERUS     ESOPHAGOGASTRODUODENOSCOPY (EGD) WITH PROPOFOL N/A 11/12/2020   Procedure: ESOPHAGOGASTRODUODENOSCOPY (EGD) WITH PROPOFOL;  Surgeon: Dolores Frame, MD;  Location: [facility];  Service: Gastroenterology;  Laterality: N/A;   I & D EXTREMITY Bilateral 09/22/2017   Procedure: BILATERAL DEBRIDEMENT LEG/FOOT ULCERS, APPLY VERAFLO WOUND VAC;  Surgeon: Nadara Mustard, MD;  Location: [facility] OR;  Service: Orthopedics;  Laterality: Bilateral;   I & D EXTREMITY Right 10/11/2018   Procedure: IRRIGATION AND DEBRIDEMENT RIGHT HAND;  Surgeon: Dominica Severin, MD;  Location: [facility] OR;  Service: Orthopedics;  Laterality: Right;   I & D EXTREMITY Right  10/13/2018   Procedure: REPEAT IRRIGATION AND DEBRIDEMENT RIGHT HAND;  Surgeon: Dominica Severin, MD;  Location: [facility] OR;  Service: Orthopedics;  Laterality: Right;   I & D EXTREMITY Right 11/22/2018   Procedure: IRRIGATION AND DEBRIDEMENT AND PINNING RIGHT HAND;  Surgeon: Dominica Severin, MD;  Location: [facility] OR;  Service: Orthopedics;  Laterality: Right;   IR RADIOLOGIST EVAL & MGMT  07/05/2018   LOWER EXTREMITY ANGIOGRAM Bilateral 04/23/2021   Procedure: RIGHT  AND LEFT LOWER EXTREMITY ANGIOGRAM;  Surgeon: Leonie Douglas, MD;  Location: [name];  Service: Vascular;  Laterality: Bilateral;   POLYPECTOMY  05/10/2019   Procedure: POLYPECTOMY;  Surgeon: West Bali, MD;  Location: [facility];  Service: Endoscopy;;   SKIN SPLIT GRAFT Bilateral 09/28/2017   Procedure: BILATERAL SPLIT THICKNESS SKIN GRAFT LEGS/FEET AND APPLY VAC;  Surgeon: Nadara Mustard, MD;  Location: [facility] OR;  Service: Orthopedics;  Laterality: Bilateral;   SKIN SPLIT GRAFT Right 11/22/2018   Procedure: SKIN GRAFT SPLIT THICKNESS;  Surgeon: Dominica Severin, MD;  Location: [facility] OR;  Service: Orthopedics;  Laterality: Right;    Family History  Problem Relation Age of Onset   Hypertension Mother    Heart attack Mother    Hypertension Father        CABG    Hypertension Sister    Hypertension Brother    Hypertension Sister    Cancer Sister        breast    Arthritis Other    Cancer Other    Diabetes Other    Asthma Other    Hypertension Daughter    Hypertension Son     Social History   Socioeconomic History   Marital status: Widowed    Spouse name: Not on file   Number of children: 2   Years of education: 9   Highest education level: 9th grade  Occupational History   Occupation: Disabled  Tobacco Use   Smoking status: Former    Packs/day: 0.25    Years: 20.00    Additional pack years: 0.00    Total pack years: 5.00    Types: Cigarettes    Quit date: 06/03/2017    Years since quitting: 5.9    Passive exposure:  Never   Smokeless tobacco: Never  Vaping Use   Vaping Use: Never used  Substance and Sexual Activity   Alcohol use: Not Currently    Alcohol/week: 0.0 standard drinks of alcohol    Comment: Quit in 2017   Drug use: Not Currently    Frequency: 3.0 times per week    Types: Marijuana, Cocaine    Comment: none since August 2018   Sexual activity: Not on file  Other Topics Concern   Not on file  Social History Narrative   Right handed   Drinks 1-2 cups caffeine daily   Social Determinants of Health  Financial Resource Strain: Medium Risk (10/15/2020)   Overall Financial Resource Strain (CARDIA)    Difficulty of Paying Living Expenses: Somewhat hard  Food Insecurity: Food Insecurity Present (10/15/2020)   Hunger Vital Sign    Worried About Running Out of Food in the Last Year: Sometimes true    Ran Out of Food in the Last Year: Sometimes true  Transportation Needs: No Transportation Needs (10/15/2020)   PRAPARE - Administrator, Civil Service (Medical): No    Lack of Transportation (Non-Medical): No  Physical Activity: Insufficiently Active (10/15/2020)   Exercise Vital Sign    Days of Exercise per Week: 7 days    Minutes of Exercise per Session: 20 min  Stress: Stress Concern Present (10/15/2020)   [employer] of Occupational Health - Occupational Stress Questionnaire    Feeling of Stress : To some extent  Social Connections: Moderately Isolated (10/15/2020)   Social Connection and Isolation Panel [NHANES]    Frequency of Communication with Friends and Family: More than three times a week    Frequency of Social Gatherings with Friends and Family: More than three times a week    Attends Religious Services: More than 4 times per year    Active Member of [employer] or Organizations: No    Attends Banker Meetings: Never    Marital Status: Widowed  Intimate Partner Violence: Not At Risk (10/15/2020)   Humiliation, Afraid, Rape, and Kick questionnaire     Fear of Current or Ex-Partner: No    Emotionally Abused: No    Physically Abused: No    Sexually Abused: No    Outpatient Medications Prior to Visit  Medication Sig Dispense Refill   ACCU-CHEK GUIDE test strip USE TO CHECK BLOOD SUGAR WITH MEALS AND AT BEDTIME. 100 strip 0   acetaminophen (TYLENOL) 325 MG tablet Take 2 tablets (650 mg total) by mouth every 6 (six) hours as needed for headache, fever or mild pain. (Patient taking differently: Take 650 mg by mouth every 6 (six) hours as needed for fever (or pain).)     amLODipine (NORVASC) 10 MG tablet Take 1 tablet (10 mg total) by mouth daily. 90 tablet 1   ammonium lactate (AMLACTIN) 12 % lotion Apply 1 Application topically as needed for dry skin. 400 g 4   ascorbic acid (VITAMIN C) 500 MG tablet Take 500 mg by mouth 2 (two) times daily.     aspirin EC 81 MG EC tablet Take 1 tablet (81 mg total) by mouth daily. Swallow whole. 30 tablet 11   BD AUTOSHIELD DUO 30G X 5 MM MISC USE TO INJECT INSULIN WITH MEALS AND AT BEDTIME. 100 each 0   Blood Glucose Monitoring Suppl (ACCU-CHEK GUIDE) w/Device KIT      Dextromethorphan-guaiFENesin (GERI-TUSSIN DM) 10-100 MG/5ML liquid Take 10 mLs by mouth every 12 (twelve) hours.     ELIQUIS 5 MG TABS tablet Take 5 mg by mouth 2 (two) times daily.     FLUoxetine (PROZAC) 40 MG capsule Take 2 capsules by mouth daily.     HUMALOG KWIKPEN 100 UNIT/ML KwikPen Inject 1-10 Units into the skin See admin instructions. Inject 1-10 units into the skin before meals and at bedtime, PER SLIDING SCALE: BGL 151-200 = 1 unit; 201-250 = 2 units; 251-300 = 4 units; 301-350 = 6 units; 351-400 = 8 units; 401-450 = 10 units     levETIRAcetam (KEPPRA) 500 MG tablet Take 1 tablet (500 mg total) by mouth 2 (two) times daily.  loperamide (IMODIUM A-D) 2 MG tablet Take 4 mg by mouth 4 (four) times daily as needed for diarrhea or loose stools.     LORazepam (ATIVAN) 0.5 MG tablet Take by mouth.     metoprolol succinate  (TOPROL-XL) 25 MG 24 hr tablet Take 50 mg by mouth daily.     Multiple Vitamin (MULTIVITAMIN WITH MINERALS) TABS tablet Take 1 tablet by mouth daily.     olopatadine (PATADAY) 0.1 % ophthalmic solution Place 1 drop into both eyes 2 (two) times daily.     oxybutynin (DITROPAN-XL) 5 MG 24 hr tablet Take 5 mg by mouth in the morning.     predniSONE (STERAPRED UNI-PAK 21 TAB) 10 MG (21) TBPK tablet Take by mouth daily. Take 6 tabs by mouth daily  for 2 days, then 5 tabs for 2 days, then 4 tabs for 2 days, then 3 tabs for 2 days, 2 tabs for 2 days, then 1 tab by mouth daily for 2 days 42 tablet 0   rosuvastatin (CRESTOR) 20 MG tablet Take 1 tablet (20 mg total) by mouth daily. 90 tablet 3   thiamine (VITAMIN B1) 100 MG tablet Take 100 mg by mouth daily.     traMADol (ULTRAM) 50 MG tablet Take 0.5 tablets by mouth every 6 (six) hours as needed for moderate pain or severe pain.     Vitamin D, Ergocalciferol, (DRISDOL) 1.25 MG (50000 UNIT) CAPS capsule Take 1 capsule (50,000 Units total) by mouth every 7 (seven) days for 8 doses. 8 capsule 0   No facility-administered medications prior to visit.    Allergies  Allergen Reactions   Sodium Hypochlorite Anaphylaxis    (Bleach wipes) "I am not able to breathe"    ROS Review of Systems  Constitutional:  Negative for chills and fever.  HENT:  Positive for congestion. Negative for ear pain, facial swelling, rhinorrhea, sinus pressure and sinus pain.   Eyes:  Negative for visual disturbance.  Respiratory:  Positive for cough. Negative for chest tightness and shortness of breath.   Neurological:  Negative for dizziness and headaches.      Objective:    Physical Exam HENT:     Head: Normocephalic.     Mouth/Throat:     Mouth: Mucous membranes are moist.     Tongue: No lesions. Tongue does not deviate from midline.     Palate: No mass and lesions.     Pharynx: No pharyngeal swelling or oropharyngeal exudate.  Cardiovascular:     Rate and Rhythm:  Normal rate.     Heart sounds: Normal heart sounds.  Pulmonary:     Effort: Pulmonary effort is normal.     Breath sounds: Normal breath sounds.  Neurological:     Mental Status: She is alert.     BP (!) 144/94   Pulse 74   Ht 5\' 1"  (1.549 m)   Wt 171 lb (77.6 kg)   LMP 05/19/2016   SpO2 95%   BMI 32.31 kg/m  Wt Readings from Last 3 Encounters:  05/03/23 171 lb (77.6 kg)  04/19/23 167 lb (75.8 kg)  04/15/23 167 lb (75.8 kg)    Lab Results  Component Value Date   TSH 1.330 03/31/2023   Lab Results  Component Value Date   WBC 5.7 03/31/2023   HGB 10.8 (L) 03/31/2023   HCT 34.1 03/31/2023   MCV 93 03/31/2023   PLT 208 03/31/2023   Lab Results  Component Value Date   NA 137 04/13/2023  K 5.1 04/13/2023   CO2 17 (L) 04/13/2023   GLUCOSE 335 (H) 04/13/2023   BUN 62 (H) 04/13/2023   CREATININE 3.05 (H) 04/13/2023   BILITOT <0.2 03/31/2023   ALKPHOS 120 03/31/2023   AST 29 03/31/2023   ALT 34 (H) 03/31/2023   PROT 5.5 (L) 03/31/2023   ALBUMIN 3.3 (L) 03/31/2023   CALCIUM 8.5 (L) 04/13/2023   ANIONGAP 9 04/13/2023   EGFR 17 (L) 03/31/2023   GFR 54.70 (L) 10/01/2014   Lab Results  Component Value Date   CHOL 184 03/31/2023   Lab Results  Component Value Date   HDL 60 03/31/2023   Lab Results  Component Value Date   LDLCALC 108 (H) 03/31/2023   Lab Results  Component Value Date   TRIG 86 03/31/2023   Lab Results  Component Value Date   CHOLHDL 3.1 03/31/2023   Lab Results  Component Value Date   HGBA1C 6.1 (H) 03/31/2023      Assessment & Plan:  Viral infection Assessment & Plan: Take medication as prescribed. Increase fluids and allow for plenty of rest. Recommend Tylenol as needed for pain, fever, or general discomfort. Recommend using a humidifier at bedtime during sleep to help with cough and nasal congestion. Follow-up if your symptoms do not improve    Orders: -     COVID-19, Flu A+B and RSV -     Promethazine-DM; Take 5 mLs by  mouth 4 (four) times daily as needed.  Dispense: 118 mL; Refill: 0 -     Fluticasone Propionate; Place 2 sprays into both nostrils daily.  Dispense: 16 g; Refill: 6    Follow-up: No follow-ups on file.   Gilmore Laroche, FNP

## 2023-05-03 NOTE — Patient Instructions (Signed)
I appreciate the opportunity to provide care to you today!    Follow up:  1 week with pcp   Viral Illness Take medication as prescribed. Increase fluids and allow for plenty of rest. Recommend Tylenol  needed for pain, fever, or general discomfort. Warm salt water gargles 3-4 times daily to help with throat pain or discomfort. Recommend using a humidifier at bedtime during sleep to help with cough and nasal congestion. Follow-up if your symptoms do not improve   Attached with your AVS, you will find valuable resources for self-education. I highly recommend dedicating some time to thoroughly examine them.   Please continue to a heart-healthy diet and increase your physical activities. Try to exercise for at least five days a week.    It was a pleasure to see you and I look forward to continuing to work together on your health and well-being. Please do not hesitate to call the office if you need care or have questions about your care.  In case of emergency, please visit the Emergency Department for urgent care, or contact our clinic at [phone] to schedule an appointment. We're here to help you!   Have a wonderful day and week. With Gratitude, Gilmore Laroche MSN, FNP-BC

## 2023-05-03 NOTE — Assessment & Plan Note (Signed)
Take medication as prescribed. Increase fluids and allow for plenty of rest. Recommend Tylenol as needed for pain, fever, or general discomfort. Recommend using a humidifier at bedtime during sleep to help with cough and nasal congestion. Follow-up if your symptoms do not improve

## 2023-05-04 ENCOUNTER — Telehealth: Payer: Self-pay | Admitting: Internal Medicine

## 2023-05-04 ENCOUNTER — Ambulatory Visit: Payer: 59 | Admitting: Internal Medicine

## 2023-05-04 ENCOUNTER — Other Ambulatory Visit: Payer: Self-pay

## 2023-05-04 ENCOUNTER — Other Ambulatory Visit: Payer: Self-pay | Admitting: Family Medicine

## 2023-05-04 MED ORDER — ACCU-CHEK FASTCLIX LANCETS MISC: 5 refills | Status: DC

## 2023-05-04 NOTE — Telephone Encounter (Signed)
Refill sent.

## 2023-05-04 NOTE — Telephone Encounter (Signed)
Prescription Request  05/04/2023  LOV: 04/15/2023  What is the name of the medication or equipment? LANCETS   Have you contacted your pharmacy to request a refill? Yes   Which pharmacy would you like this sent to?  [facility] - [place], [place] - [address] [place] [address] Phone: [phone] Fax: [phone]    Patient notified that their request is being sent to the clinical staff for review and that they should receive a response within 2 business days.   Please advise at [facility] [phone]

## 2023-05-06 ENCOUNTER — Telehealth: Payer: Self-pay | Admitting: Family Medicine

## 2023-05-06 ENCOUNTER — Telehealth: Payer: Self-pay | Admitting: Internal Medicine

## 2023-05-06 LAB — COVID-19, FLU A+B AND RSV
Influenza A, NAA: NOT DETECTED
Influenza B, NAA: NOT DETECTED
RSV, NAA: NOT DETECTED
SARS-CoV-2, NAA: NOT DETECTED

## 2023-05-06 NOTE — Telephone Encounter (Signed)
Nettie Elm called from [place], seen patient test results all was negative, but patient still feeling really bad. Nettie Elm call back # [phone].

## 2023-05-06 NOTE — Telephone Encounter (Signed)
error 

## 2023-05-06 NOTE — Progress Notes (Signed)
Please infrom the patient that she was negative for COVID, Flu and RSV. Please continue supportive treatment as discussed during her visit

## 2023-05-07 ENCOUNTER — Other Ambulatory Visit: Payer: Self-pay | Admitting: Family Medicine

## 2023-05-07 DIAGNOSIS — R2689 Other abnormalities of gait and mobility: Secondary | ICD-10-CM | POA: Diagnosis not present

## 2023-05-07 DIAGNOSIS — J441 Chronic obstructive pulmonary disease with (acute) exacerbation: Secondary | ICD-10-CM

## 2023-05-07 MED ORDER — AZITHROMYCIN 250 MG PO TABS
ORAL_TABLET | ORAL | 0 refills | Status: AC
Start: 2023-05-07 — End: 2023-05-12

## 2023-05-07 MED ORDER — PREDNISONE 20 MG PO TABS
40.0000 mg | ORAL_TABLET | Freq: Every day | ORAL | 0 refills | Status: AC
Start: 2023-05-07 — End: 2023-05-12

## 2023-05-07 NOTE — Telephone Encounter (Signed)
Nettie Elm called from [place] called in on patient. Patient is still not feeling well. Wants to see if can get something else sent in .  Last seen by Malachi Bonds 7/8

## 2023-05-07 NOTE — Progress Notes (Signed)
I called and spoke with the patient, she c/o low grade fever on 05/06/2023, cough with clear phlegm, chest congestion, dyspnea, headaches and bodyaches. Will treat today with azithromycin and prednisone for COPD exacerbation. Rx sent to the pharmacy.

## 2023-05-13 DIAGNOSIS — E8722 Chronic metabolic acidosis: Secondary | ICD-10-CM | POA: Diagnosis not present

## 2023-05-13 DIAGNOSIS — E1129 Type 2 diabetes mellitus with other diabetic kidney complication: Secondary | ICD-10-CM | POA: Diagnosis not present

## 2023-05-13 DIAGNOSIS — R809 Proteinuria, unspecified: Secondary | ICD-10-CM | POA: Diagnosis not present

## 2023-05-13 DIAGNOSIS — N189 Chronic kidney disease, unspecified: Secondary | ICD-10-CM | POA: Diagnosis not present

## 2023-05-13 DIAGNOSIS — I5032 Chronic diastolic (congestive) heart failure: Secondary | ICD-10-CM | POA: Diagnosis not present

## 2023-05-14 DIAGNOSIS — R2689 Other abnormalities of gait and mobility: Secondary | ICD-10-CM | POA: Diagnosis not present

## 2023-05-19 DIAGNOSIS — E1122 Type 2 diabetes mellitus with diabetic chronic kidney disease: Secondary | ICD-10-CM | POA: Diagnosis not present

## 2023-05-19 DIAGNOSIS — E1129 Type 2 diabetes mellitus with other diabetic kidney complication: Secondary | ICD-10-CM | POA: Diagnosis not present

## 2023-05-19 DIAGNOSIS — R809 Proteinuria, unspecified: Secondary | ICD-10-CM | POA: Diagnosis not present

## 2023-05-19 DIAGNOSIS — R2689 Other abnormalities of gait and mobility: Secondary | ICD-10-CM | POA: Diagnosis not present

## 2023-05-20 ENCOUNTER — Encounter: Payer: Self-pay | Admitting: Internal Medicine

## 2023-05-20 ENCOUNTER — Ambulatory Visit (INDEPENDENT_AMBULATORY_CARE_PROVIDER_SITE_OTHER): Payer: 59 | Admitting: Internal Medicine

## 2023-05-20 ENCOUNTER — Telehealth: Payer: Self-pay | Admitting: Internal Medicine

## 2023-05-20 ENCOUNTER — Other Ambulatory Visit: Payer: Self-pay | Admitting: Family Medicine

## 2023-05-20 VITALS — BP 147/92 | HR 82 | Ht 61.0 in | Wt 178.6 lb

## 2023-05-20 DIAGNOSIS — I10 Essential (primary) hypertension: Secondary | ICD-10-CM | POA: Diagnosis not present

## 2023-05-20 DIAGNOSIS — N184 Chronic kidney disease, stage 4 (severe): Secondary | ICD-10-CM

## 2023-05-20 MED ORDER — NIFEDIPINE ER OSMOTIC RELEASE 90 MG PO TB24
90.0000 mg | ORAL_TABLET | Freq: Every day | ORAL | 2 refills | Status: DC
Start: 2023-05-20 — End: 2023-09-07

## 2023-05-20 NOTE — Patient Instructions (Signed)
It was a pleasure to see you today.  Thank you for giving [name] the opportunity to be involved in your care.  Below is a brief recap of your visit and next steps.  We will plan to see you again in 3 months.  Summary Discontinue amlodipine Start Procardia XL 90 mg daily Follow up in 3 months Nurse visit in 1-2 weeks for BP check

## 2023-05-20 NOTE — Assessment & Plan Note (Signed)
Returning to care today for HTN follow-up.  Amlodipine was increased to 10 mg daily last month.  She is additionally prescribed metoprolol succinate 50 mg daily.  BP today is improved, but remains above goal at 147/92.  BP was elevated above goal when she was evaluated earlier this month as well. -Discontinue amlodipine in favor of Procardia XL 90 mg daily.  Continue metoprolol succinate as prescribed. -Nurse visit in 1-2 weeks for BP check.  She will return for routine follow-up in 3 months

## 2023-05-20 NOTE — Assessment & Plan Note (Signed)
She has established care with nephrology (Dr. Wolfgang Phoenix).  Evaluated yesterday (7/24).  Lasix dosage and frequency has been reduced.  Patient reports that they are also discussing fistula placement.

## 2023-05-20 NOTE — Progress Notes (Signed)
Established Patient Office Visit  Subjective   Patient ID: [name], female    DOB: July 04, 1966  Age: 57 y.o. MRN: [id number]  Chief Complaint  Patient presents with   Chronic Kidney Disease    Follow up   Hypertension    Follow up   Hyperlipidemia    Follow up   [name] returns to care today for HTN follow-up.  [name] was last evaluated by me on 6/20.  At that time her blood pressure was significantly elevated.  Amlodipine was increased to 10 mg daily.  [name] is additionally prescribed metoprolol succinate 50 mg daily.  [name] had also previously been referred to nephrology but did not have an appointment scheduled.  This was followed up on and in the interim [name] has establish care with nephrology (Dr. Wolfgang Phoenix).  [name] reports that Lasix was reduced from 40 mg twice daily to 20 mg as needed.  [name] was also evaluated at [facility] earlier this month and treated for COPD exacerbation.  There have otherwise been no acute interval events.  [name] reports feeling fairly well today.  [name] is asymptomatic and does not have any acute concerns to discuss.  Past Medical History:  Diagnosis Date   Acute lower UTI 06/04/2018   Acute metabolic encephalopathy 03/11/2021   Acute on chronic respiratory failure with hypoxia (HCC) 07/20/2021   Acute respiratory failure requiring reintubation (HCC) 11/09/2012   Acute respiratory failure with hypoxia (HCC) 12/03/2020   AKI (acute kidney injury) (HCC) 06/04/2018   Alcohol use    Ankle fracture, lateral malleolus, closed 2013   Anxiety    Aortic atherosclerosis (HCC) 03/10/2021   Atherosclerosis of native artery of both legs with gangrene (HCC)    Breast mass, left 2013   CHF (congestive heart failure) (HCC)    a. EF 20-25% by echo in 05/2016 with cath showing normal cors b. EF 50-55% in 07/2020 c. 01/2021: EF at 55-60% with moderate LVH   Chronic anemia    CKD (chronic kidney disease), stage III (HCC)    Cocaine abuse (HCC)    COPD (chronic obstructive  pulmonary disease) (HCC)    Diabetes mellitus, type 2 (HCC)    Diabetic Charcot foot (HCC)    DKA (diabetic ketoacidosis) (HCC) 10/29/2020   Essential hypertension    H/O open hand wound 11/22/2018   History of cardiomyopathy    History of GI bleed 2011   Hyperlipidemia    Lactic acid acidosis 09/11/2014   Metabolic encephalopathy 03/11/2021   Noncompliance    Obesity    Oral candidiasis 03/10/2021   PAF (paroxysmal atrial fibrillation) (HCC)    Panic attacks    PAT (paroxysmal atrial tachycardia)    Previously on Amiodarone   Pneumonia due to COVID-19 virus 12/03/2020   Seizures (HCC) 10/01/2020   Sepsis due to skin infection (HCC) 04/01/2021   Shock (HCC) 10/29/2020   Sleep apnea    Not on CPAP   Stroke (HCC) 2018   Thrombocytosis 08/02/2017   Tobacco abuse    UGI bleed    Urinary incontinence    Past Surgical History:  Procedure Laterality Date   ANGIOPLASTY ILLIAC ARTERY Right 04/23/2021   Procedure: ANGIOPLASTY AND STENT SUPERFICIAL FEMORAL ARTERY;  Surgeon: Leonie Douglas, MD;  Location: [facility] OR;  Service: Vascular;  Laterality: Right;   AORTOGRAM  04/23/2021   Procedure: AORTOGRAM;  Surgeon: Leonie Douglas, MD;  Location: [facility] OR;  Service: Vascular;;   AORTOGRAM Left 04/30/2021   Procedure: left  lower extremity angiogram with second order cannulation;  Surgeon: Leonie Douglas, MD;  Location: [facility] OR;  Service: Vascular;  Laterality: Left;   BIOPSY  11/12/2020   Procedure: BIOPSY;  Surgeon: Dolores Frame, MD;  Location: [facility];  Service: Gastroenterology;;   BREAST BIOPSY     CARDIAC CATHETERIZATION N/A 07/28/2016   Procedure: Left Heart Cath and Coronary Angiography;  Surgeon: Corky Crafts, MD;  Location: [facility] INVASIVE CV LAB;  Service: Cardiovascular;  Laterality: N/A;   COLONOSCOPY N/A 05/10/2019   Procedure: COLONOSCOPY;  Surgeon: West Bali, MD;  Location: [facility];  Service: Endoscopy;  Laterality: N/A;  Phenergan 12.5 mg IV in  pre-op   DILATION AND CURETTAGE OF UTERUS     ESOPHAGOGASTRODUODENOSCOPY (EGD) WITH PROPOFOL N/A 11/12/2020   Procedure: ESOPHAGOGASTRODUODENOSCOPY (EGD) WITH PROPOFOL;  Surgeon: Dolores Frame, MD;  Location: [facility];  Service: Gastroenterology;  Laterality: N/A;   I & D EXTREMITY Bilateral 09/22/2017   Procedure: BILATERAL DEBRIDEMENT LEG/FOOT ULCERS, APPLY VERAFLO WOUND VAC;  Surgeon: Nadara Mustard, MD;  Location: [facility] OR;  Service: Orthopedics;  Laterality: Bilateral;   I & D EXTREMITY Right 10/11/2018   Procedure: IRRIGATION AND DEBRIDEMENT RIGHT HAND;  Surgeon: Dominica Severin, MD;  Location: [facility] OR;  Service: Orthopedics;  Laterality: Right;   I & D EXTREMITY Right 10/13/2018   Procedure: REPEAT IRRIGATION AND DEBRIDEMENT RIGHT HAND;  Surgeon: Dominica Severin, MD;  Location: [facility] OR;  Service: Orthopedics;  Laterality: Right;   I & D EXTREMITY Right 11/22/2018   Procedure: IRRIGATION AND DEBRIDEMENT AND PINNING RIGHT HAND;  Surgeon: Dominica Severin, MD;  Location: [facility] OR;  Service: Orthopedics;  Laterality: Right;   IR RADIOLOGIST EVAL & MGMT  07/05/2018   LOWER EXTREMITY ANGIOGRAM Bilateral 04/23/2021   Procedure: RIGHT  AND LEFT LOWER EXTREMITY ANGIOGRAM;  Surgeon: Leonie Douglas, MD;  Location: [facility] OR;  Service: Vascular;  Laterality: Bilateral;   POLYPECTOMY  05/10/2019   Procedure: POLYPECTOMY;  Surgeon: West Bali, MD;  Location: [facility];  Service: Endoscopy;;   SKIN SPLIT GRAFT Bilateral 09/28/2017   Procedure: BILATERAL SPLIT THICKNESS SKIN GRAFT LEGS/FEET AND APPLY VAC;  Surgeon: Nadara Mustard, MD;  Location: [facility] OR;  Service: Orthopedics;  Laterality: Bilateral;   SKIN SPLIT GRAFT Right 11/22/2018   Procedure: SKIN GRAFT SPLIT THICKNESS;  Surgeon: Dominica Severin, MD;  Location: [facility] OR;  Service: Orthopedics;  Laterality: Right;   Social History   Tobacco Use   Smoking status: Former    Current packs/day: 0.00    Average packs/day: 0.3 packs/day for 20.0  years (5.0 ttl pk-yrs)    Types: Cigarettes    Start date: 06/03/1997    Quit date: 06/03/2017    Years since quitting: 5.9    Passive exposure: Never   Smokeless tobacco: Never  Vaping Use   Vaping status: Never Used  Substance Use Topics   Alcohol use: Not Currently    Alcohol/week: 0.0 standard drinks of alcohol    Comment: Quit in 2017   Drug use: Not Currently    Frequency: 3.0 times per week    Types: Marijuana, Cocaine    Comment: none since August 2018   Family History  Problem Relation Age of Onset   Hypertension Mother    Heart attack Mother    Hypertension Father        CABG    Hypertension Sister    Hypertension Brother    Hypertension Sister  Cancer Sister        breast    Arthritis Other    Cancer Other    Diabetes Other    Asthma Other    Hypertension Daughter    Hypertension Son    Allergies  Allergen Reactions   Sodium Hypochlorite Anaphylaxis    (Bleach wipes) "I am not able to breathe"   Review of Systems  Constitutional:  Negative for chills and fever.  HENT:  Negative for sore throat.   Respiratory:  Negative for cough and shortness of breath.   Cardiovascular:  Negative for chest pain, palpitations and leg swelling.  Gastrointestinal:  Negative for abdominal pain, blood in stool, constipation, diarrhea, nausea and vomiting.  Genitourinary:  Negative for dysuria and hematuria.  Musculoskeletal:  Negative for myalgias.  Skin:  Negative for itching and rash.  Neurological:  Negative for dizziness and headaches.  Psychiatric/Behavioral:  Negative for depression and suicidal ideas.      Objective:     BP (!) 147/92   Pulse 82   Ht 5\' 1"  (1.549 m)   Wt 178 lb 9.6 oz (81 kg)   LMP 05/19/2016   SpO2 92%   BMI 33.75 kg/m  BP Readings from Last 3 Encounters:  05/20/23 (!) 147/92  05/03/23 (!) 144/94  04/19/23 (!) 163/104   Physical Exam Vitals reviewed.  Constitutional:      General: [name] is not in acute distress.    Appearance:  Normal appearance. [name] is obese. [name] is ill-appearing (Chronically ill-appearing). [name] is not toxic-appearing.     Comments: Examined in wheelchair  HENT:     Head: Normocephalic and atraumatic.     Right Ear: External ear normal.     Left Ear: External ear normal.     Nose: Nose normal. No congestion or rhinorrhea.     Mouth/Throat:     Mouth: Mucous membranes are moist.     Pharynx: Oropharynx is clear. No oropharyngeal exudate or posterior oropharyngeal erythema.  Eyes:     General: No scleral icterus.    Extraocular Movements: Extraocular movements intact.     Conjunctiva/sclera: Conjunctivae normal.     Pupils: Pupils are equal, round, and reactive to light.  Cardiovascular:     Rate and Rhythm: Normal rate and regular rhythm.     Pulses: Normal pulses.     Heart sounds: Normal heart sounds. No murmur heard.    No friction rub. No gallop.  Pulmonary:     Effort: Pulmonary effort is normal.     Breath sounds: Normal breath sounds. No wheezing, rhonchi or rales.  Abdominal:     General: Abdomen is flat. Bowel sounds are normal. There is no distension.     Palpations: Abdomen is soft.     Tenderness: There is no abdominal tenderness.  Musculoskeletal:        General: No swelling.     Cervical back: Normal range of motion.     Right lower leg: No edema.     Left lower leg: No edema.  Lymphadenopathy:     Cervical: No cervical adenopathy.  Skin:    General: Skin is warm and dry.     Capillary Refill: Capillary refill takes less than 2 seconds.     Coloration: Skin is not jaundiced.  Neurological:     General: No focal deficit present.     Mental Status: [name] is alert and oriented to person, place, and time.  Psychiatric:        Mood  and Affect: Mood normal.        Behavior: Behavior normal.     Last CBC Lab Results  Component Value Date   WBC 5.7 03/31/2023   HGB 10.8 (L) 03/31/2023   HCT 34.1 03/31/2023   MCV 93 03/31/2023   MCH 29.6 03/31/2023   RDW 13.5  03/31/2023   PLT 208 03/31/2023   Last metabolic panel Lab Results  Component Value Date   GLUCOSE 335 (H) 04/13/2023   NA 137 04/13/2023   K 5.1 04/13/2023   CL 111 04/13/2023   CO2 17 (L) 04/13/2023   BUN 62 (H) 04/13/2023   CREATININE 3.05 (H) 04/13/2023   GFRNONAA 17 (L) 04/13/2023   CALCIUM 8.5 (L) 04/13/2023   PHOS 2.9 07/22/2021   PROT 5.5 (L) 03/31/2023   ALBUMIN 3.3 (L) 03/31/2023   LABGLOB 2.2 03/31/2023   AGRATIO 1.5 03/31/2023   BILITOT <0.2 03/31/2023   ALKPHOS 120 03/31/2023   AST 29 03/31/2023   ALT 34 (H) 03/31/2023   ANIONGAP 9 04/13/2023   Last lipids Lab Results  Component Value Date   CHOL 184 03/31/2023   HDL 60 03/31/2023   LDLCALC 108 (H) 03/31/2023   LDLDIRECT 122 (H) 01/08/2010   TRIG 86 03/31/2023   CHOLHDL 3.1 03/31/2023   Last hemoglobin A1c Lab Results  Component Value Date   HGBA1C 6.1 (H) 03/31/2023   Last thyroid functions Lab Results  Component Value Date   TSH 1.330 03/31/2023   Last vitamin D Lab Results  Component Value Date   VD25OH 10.8 (L) 03/31/2023   Last vitamin B12 and Folate Lab Results  Component Value Date   VITAMINB12 696 05/06/2021   FOLATE 13.4 05/06/2021     Assessment & Plan:   Problem List Items Addressed This Visit       HTN (hypertension), malignant - Primary (Chronic)    Returning to care today for HTN follow-up.  Amlodipine was increased to 10 mg daily last month.  [name] is additionally prescribed metoprolol succinate 50 mg daily.  BP today is improved, but remains above goal at 147/92.  BP was elevated above goal when [name] was evaluated earlier this month as well. -Discontinue amlodipine in favor of Procardia XL 90 mg daily.  Continue metoprolol succinate as prescribed. -Nurse visit in 1-2 weeks for BP check.  [name] will return for routine follow-up in 3 months      Chronic kidney disease (CKD), stage IV (severe) (HCC)    [name] has established care with nephrology (Dr. Wolfgang Phoenix).  Evaluated  yesterday (7/24).  Lasix dosage and frequency has been reduced.  Patient reports that they are also discussing fistula placement.       Return in about 3 months (around 08/20/2023).    Billie Lade, MD

## 2023-05-20 NOTE — Telephone Encounter (Signed)
[facility] Mallow called on patient behalf.   Needs discontinue notice sent in to Pharm for Amlodipine.  Needed for records

## 2023-05-20 NOTE — Telephone Encounter (Signed)
Faxed back to [facility].

## 2023-05-21 DIAGNOSIS — R2689 Other abnormalities of gait and mobility: Secondary | ICD-10-CM | POA: Diagnosis not present

## 2023-05-23 DIAGNOSIS — R2689 Other abnormalities of gait and mobility: Secondary | ICD-10-CM | POA: Diagnosis not present

## 2023-05-24 DIAGNOSIS — R2689 Other abnormalities of gait and mobility: Secondary | ICD-10-CM | POA: Diagnosis not present

## 2023-05-31 DIAGNOSIS — G4701 Insomnia due to medical condition: Secondary | ICD-10-CM | POA: Diagnosis not present

## 2023-05-31 DIAGNOSIS — F01B4 Vascular dementia, moderate, with anxiety: Secondary | ICD-10-CM | POA: Diagnosis not present

## 2023-06-01 ENCOUNTER — Other Ambulatory Visit (HOSPITAL_COMMUNITY): Payer: Self-pay | Admitting: Nephrology

## 2023-06-01 DIAGNOSIS — N184 Chronic kidney disease, stage 4 (severe): Secondary | ICD-10-CM

## 2023-06-09 DIAGNOSIS — R2689 Other abnormalities of gait and mobility: Secondary | ICD-10-CM | POA: Diagnosis not present

## 2023-06-14 ENCOUNTER — Ambulatory Visit (HOSPITAL_COMMUNITY)
Admission: RE | Admit: 2023-06-14 | Discharge: 2023-06-14 | Disposition: A | Discharge status: Home / Self Care | Payer: 59 | Source: Ambulatory Visit | Attending: Nephrology | Admitting: Nephrology

## 2023-06-14 DIAGNOSIS — N184 Chronic kidney disease, stage 4 (severe): Secondary | ICD-10-CM | POA: Insufficient documentation

## 2023-06-16 DIAGNOSIS — R2689 Other abnormalities of gait and mobility: Secondary | ICD-10-CM | POA: Diagnosis not present

## 2023-06-24 ENCOUNTER — Other Ambulatory Visit: Payer: Self-pay | Admitting: Internal Medicine

## 2023-06-24 ENCOUNTER — Other Ambulatory Visit: Payer: Self-pay | Admitting: Family Medicine

## 2023-06-25 DIAGNOSIS — R2689 Other abnormalities of gait and mobility: Secondary | ICD-10-CM | POA: Diagnosis not present

## 2023-06-30 ENCOUNTER — Other Ambulatory Visit: Payer: Self-pay | Admitting: Family Medicine

## 2023-07-02 ENCOUNTER — Other Ambulatory Visit: Payer: Self-pay | Admitting: Family Medicine

## 2023-07-02 DIAGNOSIS — R2689 Other abnormalities of gait and mobility: Secondary | ICD-10-CM | POA: Diagnosis not present

## 2023-07-05 DIAGNOSIS — R2689 Other abnormalities of gait and mobility: Secondary | ICD-10-CM | POA: Diagnosis not present

## 2023-07-06 DIAGNOSIS — N189 Chronic kidney disease, unspecified: Secondary | ICD-10-CM | POA: Diagnosis not present

## 2023-07-06 DIAGNOSIS — E1122 Type 2 diabetes mellitus with diabetic chronic kidney disease: Secondary | ICD-10-CM | POA: Diagnosis not present

## 2023-07-06 DIAGNOSIS — D638 Anemia in other chronic diseases classified elsewhere: Secondary | ICD-10-CM | POA: Diagnosis not present

## 2023-07-06 DIAGNOSIS — I129 Hypertensive chronic kidney disease with stage 1 through stage 4 chronic kidney disease, or unspecified chronic kidney disease: Secondary | ICD-10-CM | POA: Diagnosis not present

## 2023-07-06 DIAGNOSIS — E559 Vitamin D deficiency, unspecified: Secondary | ICD-10-CM | POA: Diagnosis not present

## 2023-07-09 DIAGNOSIS — R2689 Other abnormalities of gait and mobility: Secondary | ICD-10-CM | POA: Diagnosis not present

## 2023-07-12 DIAGNOSIS — R808 Other proteinuria: Secondary | ICD-10-CM | POA: Diagnosis not present

## 2023-07-12 DIAGNOSIS — I5032 Chronic diastolic (congestive) heart failure: Secondary | ICD-10-CM | POA: Diagnosis not present

## 2023-07-12 DIAGNOSIS — R3914 Feeling of incomplete bladder emptying: Secondary | ICD-10-CM | POA: Diagnosis not present

## 2023-07-12 DIAGNOSIS — N281 Cyst of kidney, acquired: Secondary | ICD-10-CM | POA: Diagnosis not present

## 2023-07-13 DIAGNOSIS — M6281 Muscle weakness (generalized): Secondary | ICD-10-CM | POA: Diagnosis not present

## 2023-07-15 DIAGNOSIS — M6281 Muscle weakness (generalized): Secondary | ICD-10-CM | POA: Diagnosis not present

## 2023-07-16 DIAGNOSIS — R2689 Other abnormalities of gait and mobility: Secondary | ICD-10-CM | POA: Diagnosis not present

## 2023-07-21 ENCOUNTER — Other Ambulatory Visit: Payer: Self-pay | Admitting: *Deleted

## 2023-07-21 DIAGNOSIS — I739 Peripheral vascular disease, unspecified: Secondary | ICD-10-CM

## 2023-07-23 DIAGNOSIS — R2689 Other abnormalities of gait and mobility: Secondary | ICD-10-CM | POA: Diagnosis not present

## 2023-07-26 ENCOUNTER — Telehealth: Payer: Self-pay | Admitting: Internal Medicine

## 2023-07-26 ENCOUNTER — Ambulatory Visit (INDEPENDENT_AMBULATORY_CARE_PROVIDER_SITE_OTHER): Payer: 59 | Admitting: Family Medicine

## 2023-07-26 VITALS — BP 170/110 | HR 86 | Resp 16 | Ht 61.0 in | Wt 181.4 lb

## 2023-07-26 DIAGNOSIS — R2689 Other abnormalities of gait and mobility: Secondary | ICD-10-CM | POA: Diagnosis not present

## 2023-07-26 DIAGNOSIS — D1720 Benign lipomatous neoplasm of skin and subcutaneous tissue of unspecified limb: Secondary | ICD-10-CM | POA: Diagnosis not present

## 2023-07-26 NOTE — Telephone Encounter (Signed)
Assistant living forms   Noted  Copied Sleeved  Original in PCP box Copy front desk folder

## 2023-07-26 NOTE — Assessment & Plan Note (Signed)
Right upper arm Small, painless, and not growing. Advise patient No treatment is necessary unless it changes in size or becomes symptomatic. Discussed applying a warm compress to the area can increase blood flow and may help alleviate discomfort if the lipoma causes mild tenderness 3-4 times daily Keep the area around the lipoma clean to prevent infection Follow up for rapid growth, pain, redness, fever or any signs of infection such as like warmth or discharge

## 2023-07-26 NOTE — Progress Notes (Signed)
Patient Office Visit   Subjective   Patient ID: [name], female    DOB: 02/14/1966  Age: 57 y.o. MRN: [id number]  CC:  Chief Complaint  Patient presents with   Cyst    On right arm. Was told it was a strained muscle so she just wants it checked     HPI [name] 57 year on female, presents to the clinic for right upper arm cyst.She  has a past medical history of Acute lower UTI (06/04/2018), Acute metabolic encephalopathy (03/11/2021), Acute on chronic respiratory failure with hypoxia (HCC) (07/20/2021), Acute respiratory failure requiring reintubation (HCC) (11/09/2012), Acute respiratory failure with hypoxia (HCC) (12/03/2020), AKI (acute kidney injury) (HCC) (06/04/2018), Alcohol use, Ankle fracture, lateral malleolus, closed (2013), Anxiety, Aortic atherosclerosis (HCC) (03/10/2021), Atherosclerosis of native artery of both legs with gangrene (HCC), Breast mass, left (2013), CHF (congestive heart failure) (HCC), Chronic anemia, CKD (chronic kidney disease), stage III (HCC), Cocaine abuse (HCC), COPD (chronic obstructive pulmonary disease) (HCC), Diabetes mellitus, type 2 (HCC), Diabetic Charcot foot (HCC), DKA (diabetic ketoacidosis) (HCC) (10/29/2020), Essential hypertension, H/O open hand wound (11/22/2018), History of cardiomyopathy, History of GI bleed (2011), Hyperlipidemia, Lactic acid acidosis (09/11/2014), Metabolic encephalopathy (03/11/2021), Noncompliance, Obesity, Oral candidiasis (03/10/2021), PAF (paroxysmal atrial fibrillation) (HCC), Panic attacks, PAT (paroxysmal atrial tachycardia), Pneumonia due to COVID-19 virus (12/03/2020), Seizures (HCC) (10/01/2020), Sepsis due to skin infection (HCC) (04/01/2021), Shock (HCC) (10/29/2020), Sleep apnea, Stroke (HCC) (2018), Thrombocytosis (08/02/2017), Tobacco abuse, UGI bleed, and Urinary incontinence.  HPI    Outpatient Encounter Medications as of 07/26/2023  Medication Sig   Accu-Chek FastClix Lancets MISC Test up  to 4 times daily DX e11.65   ACCU-CHEK GUIDE test strip USE TO CHECK BLOOD SUGAR WITH MEALS AND AT BEDTIME.   acetaminophen (TYLENOL) 325 MG tablet Take 2 tablets (650 mg total) by mouth every 6 (six) hours as needed for headache, fever or mild pain. (Patient taking differently: Take 650 mg by mouth every 6 (six) hours as needed for fever (or pain).)   ammonium lactate (AMLACTIN) 12 % lotion Apply 1 Application topically as needed for dry skin.   ARIPiprazole (ABILIFY) 5 MG tablet Take 5 mg by mouth daily.   ascorbic acid (VITAMIN C) 500 MG tablet TAKE (1) TABLET BY MOUTH TWICE DAILY.   ASPIRIN ADULT LOW STRENGTH 81 MG tablet TAKE (1) TABLET BY MOUTH ONCE DAILY.   BD AUTOSHIELD DUO 30G X 5 MM MISC USE TO INJECT INSULIN WITH MEALS AND AT BEDTIME.   Blood Glucose Monitoring Suppl (ACCU-CHEK GUIDE) w/Device KIT    calcitRIOL (ROCALTROL) 0.25 MCG capsule Take 0.25 mcg by mouth daily.   Dextromethorphan-guaiFENesin (GERI-TUSSIN DM) 10-100 MG/5ML liquid Take 10 mLs by mouth every 12 (twelve) hours.   ELIQUIS 5 MG TABS tablet TAKE (1) TABLET BY MOUTH TWICE DAILY.   FLUoxetine (PROZAC) 40 MG capsule TAKE (2) CAPSULES BY MOUTH ONCE DAILY.   fluticasone (FLONASE) 50 MCG/ACT nasal spray Place 2 sprays into both nostrils daily.   furosemide (LASIX) 20 MG tablet Take 20 mg by mouth 2 (two) times daily.   HUMALOG KWIKPEN 100 UNIT/ML KwikPen Inject 1-10 Units into the skin See admin instructions. Inject 1-10 units into the skin before meals and at bedtime, PER SLIDING SCALE: BGL 151-200 = 1 unit; 201-250 = 2 units; 251-300 = 4 units; 301-350 = 6 units; 351-400 = 8 units; 401-450 = 10 units   levETIRAcetam (KEPPRA) 500 MG tablet TAKE (1) TABLET BY MOUTH TWICE DAILY.  loperamide (IMODIUM A-D) 2 MG tablet Take 4 mg by mouth 4 (four) times daily as needed for diarrhea or loose stools.   LORazepam (ATIVAN) 0.5 MG tablet TAKE (1) TABLET BY MOUTH TWICE DAILY.   metoprolol succinate (TOPROL-XL) 25 MG 24 hr tablet TAKE  (1) TABLET BY MOUTH IN THE MORNING.   Multiple Vitamins-Minerals (THERA-M) TABS TAKE (1) TABLET BY MOUTH ONCE DAILY.   NIFEdipine (PROCARDIA XL) 90 MG 24 hr tablet Take 1 tablet (90 mg total) by mouth daily.   olopatadine (PATADAY) 0.1 % ophthalmic solution Place 1 drop into both eyes 2 (two) times daily.   oxybutynin (DITROPAN-XL) 5 MG 24 hr tablet TAKE (1) TABLET BY MOUTH ONCE DAILY.   rosuvastatin (CRESTOR) 20 MG tablet Take 1 tablet (20 mg total) by mouth daily.   sodium bicarbonate 650 MG tablet Take by mouth.   thiamine (VITAMIN B1) 100 MG tablet TAKE (1) TABLET BY MOUTH ONCE DAILY.   traMADol (ULTRAM) 50 MG tablet Take 0.5 tablets by mouth every 6 (six) hours as needed for moderate pain or severe pain.   [DISCONTINUED] albuterol (PROAIR HFA) 108 (90 Base) MCG/ACT inhaler INHALE 2 PUFFS EVERY 6 HOURS AS NEEDED FOR SHORTNESS OF BREATH/WHEEZING. (Patient not taking: Reported on 04/21/2021)   No facility-administered encounter medications on file as of 07/26/2023.    Past Surgical History:  Procedure Laterality Date   ANGIOPLASTY ILLIAC ARTERY Right 04/23/2021   Procedure: ANGIOPLASTY AND STENT SUPERFICIAL FEMORAL ARTERY;  Surgeon: Leonie Douglas, MD;  Location: [facility] OR;  Service: Vascular;  Laterality: Right;   AORTOGRAM  04/23/2021   Procedure: AORTOGRAM;  Surgeon: Leonie Douglas, MD;  Location: [facility] OR;  Service: Vascular;;   AORTOGRAM Left 04/30/2021   Procedure: left lower extremity angiogram with second order cannulation;  Surgeon: Leonie Douglas, MD;  Location: [facility] OR;  Service: Vascular;  Laterality: Left;   BIOPSY  11/12/2020   Procedure: BIOPSY;  Surgeon: Dolores Frame, MD;  Location: [facility] SUITE;  Service: Gastroenterology;;   BREAST BIOPSY     CARDIAC CATHETERIZATION N/A 07/28/2016   Procedure: Left Heart Cath and Coronary Angiography;  Surgeon: Corky Crafts, MD;  Location[facility] CV LAB;  Service: Cardiovascular;  Laterality: N/A;   COLONOSCOPY N/A  05/10/2019   Procedure: COLONOSCOPY;  Surgeon: West Bali, MD;  Location: [facility];  Service: Endoscopy;  Laterality: N/A;  Phenergan 12.5 mg IV in pre-op   DILATION AND CURETTAGE OF UTERUS     ESOPHAGOGASTRODUODENOSCOPY (EGD) WITH PROPOFOL N/A 11/12/2020   Procedure: ESOPHAGOGASTRODUODENOSCOPY (EGD) WITH PROPOFOL;  Surgeon: Dolores Frame, MD;  Location: [facility];  Service: Gastroenterology;  Laterality: N/A;   I & D EXTREMITY Bilateral 09/22/2017   Procedure: BILATERAL DEBRIDEMENT LEG/FOOT ULCERS, APPLY VERAFLO WOUND VAC;  Surgeon: Nadara Mustard, MD;  Location: [facility] OR;  Service: Orthopedics;  Laterality: Bilateral;   I & D EXTREMITY Right 10/11/2018   Procedure: IRRIGATION AND DEBRIDEMENT RIGHT HAND;  Surgeon: Dominica Severin, MD;  Location: [facility] OR;  Service: Orthopedics;  Laterality: Right;   I & D EXTREMITY Right 10/13/2018   Procedure: REPEAT IRRIGATION AND DEBRIDEMENT RIGHT HAND;  Surgeon: Dominica Severin, MD;  Location: [facility] OR;  Service: Orthopedics;  Laterality: Right;   I & D EXTREMITY Right 11/22/2018   Procedure: IRRIGATION AND DEBRIDEMENT AND PINNING RIGHT HAND;  Surgeon: Dominica Severin, MD;  Location: [facility] OR;  Service: Orthopedics;  Laterality: Right;   IR RADIOLOGIST EVAL & MGMT  07/05/2018  LOWER EXTREMITY ANGIOGRAM Bilateral 04/23/2021   Procedure: RIGHT  AND LEFT LOWER EXTREMITY ANGIOGRAM;  Surgeon: Leonie Douglas, MD;  Location: [facility] OR;  Service: Vascular;  Laterality: Bilateral;   POLYPECTOMY  05/10/2019   Procedure: POLYPECTOMY;  Surgeon: West Bali, MD;  [facility]: [facility];  Service: Endoscopy;;   SKIN SPLIT GRAFT Bilateral 09/28/2017   Procedure: BILATERAL SPLIT THICKNESS SKIN GRAFT LEGS/FEET AND APPLY VAC;  Surgeon: Nadara Mustard, MD;  Location: [facility] OR;  Service: Orthopedics;  Laterality: Bilateral;   SKIN SPLIT GRAFT Right 11/22/2018   Procedure: SKIN GRAFT SPLIT THICKNESS;  Surgeon: Dominica Severin, MD;  Location: [facility] OR;  Service: Orthopedics;   Laterality: Right;    Review of Systems  Constitutional:  Negative for chills and fever.  Eyes:  Negative for blurred vision.  Respiratory:  Negative for shortness of breath.   Cardiovascular:  Negative for chest pain.  Genitourinary:  Negative for dysuria.  Skin:  Negative for itching and rash.  Neurological:  Negative for dizziness and headaches.      Objective    BP (!) 174/112   Pulse 86   Resp 16   Ht 5\' 1"  (1.549 m)   Wt 181 lb 6.4 oz (82.3 kg)   LMP 05/19/2016   SpO2 95%   BMI 34.28 kg/m   Physical Exam Vitals reviewed.  Constitutional:      General: She is not in acute distress.    Appearance: Normal appearance. She is not ill-appearing, toxic-appearing or diaphoretic.  HENT:     Head: Normocephalic.  Eyes:     General:        Right eye: No discharge.        Left eye: No discharge.     Conjunctiva/sclera: Conjunctivae normal.  Cardiovascular:     Rate and Rhythm: Normal rate.     Pulses: Normal pulses.     Heart sounds: Normal heart sounds.  Pulmonary:     Effort: Pulmonary effort is normal. No respiratory distress.     Breath sounds: Normal breath sounds.  Musculoskeletal:        General: Normal range of motion.     Cervical back: Normal range of motion.  Skin:    General: Skin is warm and dry.     Capillary Refill: Capillary refill takes less than 2 seconds.     Comments: Right upper arm: painless medium size lump , soft on light palpation, mass is well-defined with smooth borders, appears normal, with no discoloration, inflammation, or warmth,  Neurological:     Mental Status: She is alert and oriented to person, place, and time.       Assessment & Plan:  Lipoma of upper arm Assessment & Plan: Right upper arm Small, painless, and not growing. Advise patient No treatment is necessary unless it changes in size or becomes symptomatic. Discussed applying a warm compress to the area can increase blood flow and may help alleviate discomfort if the  lipoma causes mild tenderness 3-4 times daily Keep the area around the lipoma clean to prevent infection Follow up for rapid growth, pain, redness, fever or any signs of infection such as like warmth or discharge     Return if symptoms worsen or fail to improve.   Cruzita Lederer Newman Nip, FNP

## 2023-07-27 ENCOUNTER — Encounter: Payer: Self-pay | Admitting: Physician Assistant

## 2023-07-27 ENCOUNTER — Ambulatory Visit (INDEPENDENT_AMBULATORY_CARE_PROVIDER_SITE_OTHER)
Admission: RE | Admit: 2023-07-27 | Discharge: 2023-07-27 | Discharge status: Home / Self Care | Payer: 59 | Source: Ambulatory Visit | Attending: Vascular Surgery

## 2023-07-27 ENCOUNTER — Ambulatory Visit (INDEPENDENT_AMBULATORY_CARE_PROVIDER_SITE_OTHER): Payer: 59 | Admitting: Physician Assistant

## 2023-07-27 ENCOUNTER — Ambulatory Visit (HOSPITAL_COMMUNITY)
Admission: RE | Admit: 2023-07-27 | Discharge: 2023-07-27 | Disposition: A | Discharge status: Home / Self Care | Payer: 59 | Source: Ambulatory Visit | Attending: Surgery | Admitting: Surgery

## 2023-07-27 VITALS — BP 158/96 | HR 75 | Temp 97.7°F

## 2023-07-27 DIAGNOSIS — I739 Peripheral vascular disease, unspecified: Secondary | ICD-10-CM

## 2023-07-27 DIAGNOSIS — M6281 Muscle weakness (generalized): Secondary | ICD-10-CM | POA: Diagnosis not present

## 2023-07-27 LAB — VAS US ABI WITH/WO TBI
Left ABI: 1.03
Right ABI: 1.02

## 2023-07-27 NOTE — Progress Notes (Signed)
Office Note   History of Present Illness   [name] is a 57 y.o. (1966/06/02) female who presents for surveillance of PAD.  She has a history of right SFA stenting by Dr. Lenell Antu in June 2022.  This was done for revascularization in the setting of bilateral ankle gangrenous skin changes.  Thankfully the patient's wounds were healed in 2023.  The patient resides at [facility] nursing facility.  She returns today for follow-up.  She denies any recent medical history changes.  She denies any rest pain or tissue loss of the lower extremities.  She mostly mobilizes via wheelchair, however she states that she is still working with physical therapy.  Current Outpatient Medications  Medication Sig Dispense Refill   Accu-Chek FastClix Lancets MISC Test up to 4 times daily DX e11.65 150 each 5   ACCU-CHEK GUIDE test strip USE TO CHECK BLOOD SUGAR WITH MEALS AND AT BEDTIME. 100 strip 0   acetaminophen (TYLENOL) 325 MG tablet Take 2 tablets (650 mg total) by mouth every 6 (six) hours as needed for headache, fever or mild pain. (Patient taking differently: Take 650 mg by mouth every 6 (six) hours as needed for fever (or pain).)     ammonium lactate (AMLACTIN) 12 % lotion Apply 1 Application topically as needed for dry skin. 400 g 4   ARIPiprazole (ABILIFY) 5 MG tablet Take 5 mg by mouth daily.     ascorbic acid (VITAMIN C) 500 MG tablet TAKE (1) TABLET BY MOUTH TWICE DAILY. 60 tablet 0   ASPIRIN ADULT LOW STRENGTH 81 MG tablet TAKE (1) TABLET BY MOUTH ONCE DAILY. 30 tablet 0   BD AUTOSHIELD DUO 30G X 5 MM MISC USE TO INJECT INSULIN WITH MEALS AND AT BEDTIME. 100 each 0   Blood Glucose Monitoring Suppl (ACCU-CHEK GUIDE) w/Device KIT      calcitRIOL (ROCALTROL) 0.25 MCG capsule Take 0.25 mcg by mouth daily.     Dextromethorphan-guaiFENesin (GERI-TUSSIN DM) 10-100 MG/5ML liquid Take 10 mLs by mouth every 12 (twelve) hours.     ELIQUIS 5 MG TABS tablet TAKE (1) TABLET BY MOUTH TWICE DAILY. 60  tablet 0   FLUoxetine (PROZAC) 40 MG capsule TAKE (2) CAPSULES BY MOUTH ONCE DAILY. 60 capsule 0   fluticasone (FLONASE) 50 MCG/ACT nasal spray Place 2 sprays into both nostrils daily. 16 g 6   furosemide (LASIX) 20 MG tablet Take 20 mg by mouth 2 (two) times daily.     HUMALOG KWIKPEN 100 UNIT/ML KwikPen Inject 1-10 Units into the skin See admin instructions. Inject 1-10 units into the skin before meals and at bedtime, PER SLIDING SCALE: BGL 151-200 = 1 unit; 201-250 = 2 units; 251-300 = 4 units; 301-350 = 6 units; 351-400 = 8 units; 401-450 = 10 units     levETIRAcetam (KEPPRA) 500 MG tablet TAKE (1) TABLET BY MOUTH TWICE DAILY. 60 tablet 0   loperamide (IMODIUM A-D) 2 MG tablet Take 4 mg by mouth 4 (four) times daily as needed for diarrhea or loose stools.     LORazepam (ATIVAN) 0.5 MG tablet TAKE (1) TABLET BY MOUTH TWICE DAILY. 60 tablet 0   metoprolol succinate (TOPROL-XL) 25 MG 24 hr tablet TAKE (1) TABLET BY MOUTH IN THE MORNING. 30 tablet 0   Multiple Vitamins-Minerals (THERA-M) TABS TAKE (1) TABLET BY MOUTH ONCE DAILY. 30 tablet 0   NIFEdipine (PROCARDIA XL) 90 MG 24 hr tablet Take 1 tablet (90 mg total) by mouth daily. 30 tablet 2  olopatadine (PATADAY) 0.1 % ophthalmic solution Place 1 drop into both eyes 2 (two) times daily.     oxybutynin (DITROPAN-XL) 5 MG 24 hr tablet TAKE (1) TABLET BY MOUTH ONCE DAILY. 30 tablet 0   rosuvastatin (CRESTOR) 20 MG tablet Take 1 tablet (20 mg total) by mouth daily. 90 tablet 3   sodium bicarbonate 650 MG tablet Take by mouth.     thiamine (VITAMIN B1) 100 MG tablet TAKE (1) TABLET BY MOUTH ONCE DAILY. 30 tablet 0   traMADol (ULTRAM) 50 MG tablet Take 0.5 tablets by mouth every 6 (six) hours as needed for moderate pain or severe pain.     No current facility-administered medications for this visit.    REVIEW OF SYSTEMS (negative unless checked):   Cardiac:  []  Chest pain or chest pressure? []  Shortness of breath upon activity? []  Shortness of  breath when lying flat? []  Irregular heart rhythm?  Vascular:  []  Pain in calf, thigh, or hip brought on by walking? []  Pain in feet at night that wakes you up from your sleep? []  Blood clot in your veins? []  Leg swelling?  Pulmonary:  []  Oxygen at home? []  Productive cough? []  Wheezing?  Neurologic:  []  Sudden weakness in arms or legs? []  Sudden numbness in arms or legs? []  Sudden onset of difficult speaking or slurred speech? []  Temporary loss of vision in one eye? []  Problems with dizziness?  Gastrointestinal:  []  Blood in stool? []  Vomited blood?  Genitourinary:  []  Burning when urinating? []  Blood in urine?  Psychiatric:  []  Major depression  Hematologic:  []  Bleeding problems? []  Problems with blood clotting?  Dermatologic:  []  Rashes or ulcers?  Constitutional:  []  Fever or chills?  Ear/Nose/Throat:  []  Change in hearing? []  Nose bleeds? []  Sore throat?  Musculoskeletal:  []  Back pain? []  Joint pain? []  Muscle pain?   Physical Examination   Vitals:   07/27/23 1246  BP: (!) 158/96  Pulse: 75  Temp: 97.7 F (36.5 C)  TempSrc: Temporal  SpO2: 97%   There is no height or weight on file to calculate BMI.  General:  WDWN in NAD; vital signs documented above Gait: Not observed HENT: WNL, normocephalic Pulmonary: normal non-labored breathing , without rales, rhonchi,  wheezing Cardiac: regular Abdomen: soft, NT, no masses Skin: without rashes Vascular Exam/Pulses: 2+ DP pulses bilaterally Extremities: without ischemic changes, without gangrene , without cellulitis; without open wounds;  Musculoskeletal: no muscle wasting or atrophy  Neurologic: A&O X 3;  No focal weakness or paresthesias are detected Psychiatric:  The pt has Normal affect.  Non-Invasive Vascular imaging   ABI (07/27/2023) R:  ABI: 1.02 (1.05),  PT: tri DP: tri TBI: 0.99 L:  ABI: 1.03 (1.02),  PT: mono DP: tri TBI: 0.92   RLE Arterial Duplex  (07/27/2023) Patent right SFA stent without stenosis.  Unchanged 50 to 74% stenosis of the distal SFA.   Medical Decision Making   [name] is a 57 y.o. female who presents for surveillance of PAD  Based on the patient's vascular studies, her ABIs are essentially unchanged bilaterally.  Her right ABI is 1.02 and left ABI is 1.03 Right lower extremity arterial duplex demonstrates a patent right SFA stent without stenosis.  There is unchanged 50 to 74% stenosis of the distal SFA.  This was present on last year's duplex On exam she has 2+ DP pulses bilaterally.  She denies any rest pain or tissue loss.  She mostly gets around  via wheelchair and does not ambulate enough to claudicate. She should continue her current medications and follow-up with our office in 1 year with repeat ABIs and right lower extremity arterial duplex study   Loel Dubonnet PA-C [facility] Office: [phone]  Clinic MD: Steve Rattler

## 2023-07-28 DIAGNOSIS — R2689 Other abnormalities of gait and mobility: Secondary | ICD-10-CM | POA: Diagnosis not present

## 2023-07-29 DIAGNOSIS — M6281 Muscle weakness (generalized): Secondary | ICD-10-CM | POA: Diagnosis not present

## 2023-08-02 DIAGNOSIS — R2689 Other abnormalities of gait and mobility: Secondary | ICD-10-CM | POA: Diagnosis not present

## 2023-08-03 DIAGNOSIS — M6281 Muscle weakness (generalized): Secondary | ICD-10-CM | POA: Diagnosis not present

## 2023-08-05 DIAGNOSIS — M6281 Muscle weakness (generalized): Secondary | ICD-10-CM | POA: Diagnosis not present

## 2023-08-06 DIAGNOSIS — R2689 Other abnormalities of gait and mobility: Secondary | ICD-10-CM | POA: Diagnosis not present

## 2023-08-09 DIAGNOSIS — R2689 Other abnormalities of gait and mobility: Secondary | ICD-10-CM | POA: Diagnosis not present

## 2023-08-10 ENCOUNTER — Other Ambulatory Visit: Payer: Self-pay | Admitting: Internal Medicine

## 2023-08-10 DIAGNOSIS — M6281 Muscle weakness (generalized): Secondary | ICD-10-CM | POA: Diagnosis not present

## 2023-08-11 DIAGNOSIS — R2689 Other abnormalities of gait and mobility: Secondary | ICD-10-CM | POA: Diagnosis not present

## 2023-08-12 DIAGNOSIS — M6281 Muscle weakness (generalized): Secondary | ICD-10-CM | POA: Diagnosis not present

## 2023-08-13 ENCOUNTER — Other Ambulatory Visit: Payer: Self-pay

## 2023-08-13 DIAGNOSIS — I739 Peripheral vascular disease, unspecified: Secondary | ICD-10-CM

## 2023-08-13 DIAGNOSIS — N184 Chronic kidney disease, stage 4 (severe): Secondary | ICD-10-CM

## 2023-08-13 DIAGNOSIS — R2689 Other abnormalities of gait and mobility: Secondary | ICD-10-CM | POA: Diagnosis not present

## 2023-08-16 ENCOUNTER — Other Ambulatory Visit: Payer: Self-pay | Admitting: Family Medicine

## 2023-08-17 DIAGNOSIS — M6281 Muscle weakness (generalized): Secondary | ICD-10-CM | POA: Diagnosis not present

## 2023-08-18 DIAGNOSIS — E1122 Type 2 diabetes mellitus with diabetic chronic kidney disease: Secondary | ICD-10-CM | POA: Diagnosis not present

## 2023-08-18 DIAGNOSIS — R2689 Other abnormalities of gait and mobility: Secondary | ICD-10-CM | POA: Diagnosis not present

## 2023-08-18 DIAGNOSIS — N184 Chronic kidney disease, stage 4 (severe): Secondary | ICD-10-CM | POA: Diagnosis not present

## 2023-08-18 DIAGNOSIS — D638 Anemia in other chronic diseases classified elsewhere: Secondary | ICD-10-CM | POA: Diagnosis not present

## 2023-08-18 DIAGNOSIS — I129 Hypertensive chronic kidney disease with stage 1 through stage 4 chronic kidney disease, or unspecified chronic kidney disease: Secondary | ICD-10-CM | POA: Diagnosis not present

## 2023-08-18 DIAGNOSIS — N189 Chronic kidney disease, unspecified: Secondary | ICD-10-CM | POA: Diagnosis not present

## 2023-08-19 ENCOUNTER — Ambulatory Visit: Payer: 59 | Admitting: Internal Medicine

## 2023-08-19 DIAGNOSIS — M6281 Muscle weakness (generalized): Secondary | ICD-10-CM | POA: Diagnosis not present

## 2023-08-23 DIAGNOSIS — M6281 Muscle weakness (generalized): Secondary | ICD-10-CM | POA: Diagnosis not present

## 2023-08-24 DIAGNOSIS — M6281 Muscle weakness (generalized): Secondary | ICD-10-CM | POA: Diagnosis not present

## 2023-08-25 DIAGNOSIS — D638 Anemia in other chronic diseases classified elsewhere: Secondary | ICD-10-CM | POA: Diagnosis not present

## 2023-08-25 DIAGNOSIS — R2689 Other abnormalities of gait and mobility: Secondary | ICD-10-CM | POA: Diagnosis not present

## 2023-08-25 DIAGNOSIS — E1129 Type 2 diabetes mellitus with other diabetic kidney complication: Secondary | ICD-10-CM | POA: Diagnosis not present

## 2023-08-25 DIAGNOSIS — E1122 Type 2 diabetes mellitus with diabetic chronic kidney disease: Secondary | ICD-10-CM | POA: Diagnosis not present

## 2023-08-25 DIAGNOSIS — I129 Hypertensive chronic kidney disease with stage 1 through stage 4 chronic kidney disease, or unspecified chronic kidney disease: Secondary | ICD-10-CM | POA: Diagnosis not present

## 2023-08-26 ENCOUNTER — Ambulatory Visit (HOSPITAL_COMMUNITY)
Admission: RE | Admit: 2023-08-26 | Discharge: 2023-08-26 | Disposition: A | Discharge status: Home / Self Care | Payer: 59 | Source: Ambulatory Visit | Attending: Vascular Surgery | Admitting: Vascular Surgery

## 2023-08-26 ENCOUNTER — Ambulatory Visit (INDEPENDENT_AMBULATORY_CARE_PROVIDER_SITE_OTHER)
Admission: RE | Admit: 2023-08-26 | Discharge: 2023-08-26 | Disposition: A | Discharge status: Home / Self Care | Payer: 59 | Source: Ambulatory Visit | Attending: Vascular Surgery | Admitting: Vascular Surgery

## 2023-08-26 DIAGNOSIS — N184 Chronic kidney disease, stage 4 (severe): Secondary | ICD-10-CM | POA: Diagnosis not present

## 2023-08-27 DIAGNOSIS — R2689 Other abnormalities of gait and mobility: Secondary | ICD-10-CM | POA: Diagnosis not present

## 2023-08-31 ENCOUNTER — Ambulatory Visit: Payer: 59 | Admitting: Vascular Surgery

## 2023-09-01 DIAGNOSIS — R2689 Other abnormalities of gait and mobility: Secondary | ICD-10-CM | POA: Diagnosis not present

## 2023-09-03 ENCOUNTER — Other Ambulatory Visit: Payer: Self-pay | Admitting: Internal Medicine

## 2023-09-03 DIAGNOSIS — R2689 Other abnormalities of gait and mobility: Secondary | ICD-10-CM | POA: Diagnosis not present

## 2023-09-03 NOTE — Patient Outreach (Signed)
Successful call to patient on today regarding preventative mammogram screening. Patient is currently at [facility] assisted living. Screening declined at at this time.   Baruch Gouty [facility] Assistant [facility] [phone]

## 2023-09-06 ENCOUNTER — Encounter: Payer: Self-pay | Admitting: Internal Medicine

## 2023-09-06 NOTE — Progress Notes (Unsigned)
[facility] [place]  ASSESSMENT / PLAN: [name] is a 57 y.o. right handed female in need of permanent dialysis access. I reviewed options for dialysis in detail with the patient, including hemodialysis and peritoneal dialysis. I counseled the patient to ask their nephrologist about their candidacy for renal transplant. I counseled the patient that dialysis access requires surveillance and periodic maintenance.  She does not have any usable superficial veins in her arms for fistula creation.  We can place the graft when she is near to dialysis dependence.  Please call to schedule surgery.  CHIEF COMPLAINT: Worsening renal function  HISTORY OF PRESENT ILLNESS: [name] is a 57 y.o. female well-known to me having previously undergone lower extremity angiogram and intervention for ulceration associated with peripheral arterial disease.  The patient has unfortunately suffered deteriorating renal function.  She lives in a nursing facility.  Her nephrologist is referred her for evaluation and creation of a fistula.  She yet began dialysis.  She has never had dialysis access.  We reviewed her duplex in detail.  She is right-handed.  VASCULAR SURGICAL HISTORY:  Right femoropopliteal stenting 04/24/23  Past Medical History:  Diagnosis Date   Acute lower UTI 06/04/2018   Acute metabolic encephalopathy 03/11/2021   Acute on chronic respiratory failure with hypoxia (HCC) 07/20/2021   Acute respiratory failure requiring reintubation (HCC) 11/09/2012   Acute respiratory failure with hypoxia (HCC) 12/03/2020   AKI (acute kidney injury) (HCC) 06/04/2018   Alcohol use    Ankle fracture, lateral malleolus, closed 2013   Anxiety    Aortic atherosclerosis (HCC) 03/10/2021   Atherosclerosis of native artery of both legs with gangrene (HCC)    Breast mass, left 2013   CHF (congestive heart failure) (HCC)    a. EF 20-25% by echo in 05/2016 with cath showing normal cors b.  EF 50-55% in 07/2020 c. 01/2021: EF at 55-60% with moderate LVH   Chronic anemia    CKD (chronic kidney disease), stage III (HCC)    Cocaine abuse (HCC)    COPD (chronic obstructive pulmonary disease) (HCC)    Diabetes mellitus, type 2 (HCC)    Diabetic Charcot foot (HCC)    DKA (diabetic ketoacidosis) (HCC) 10/29/2020   Essential hypertension    H/O open hand wound 11/22/2018   History of cardiomyopathy    History of GI bleed 2011   Hyperlipidemia    Lactic acid acidosis 09/11/2014   Metabolic encephalopathy 03/11/2021   Noncompliance    Obesity    Oral candidiasis 03/10/2021   PAF (paroxysmal atrial fibrillation) (HCC)    Panic attacks    PAT (paroxysmal atrial tachycardia) (HCC)    Previously on Amiodarone   Pneumonia due to COVID-19 virus 12/03/2020   Seizures (HCC) 10/01/2020   Sepsis due to skin infection (HCC) 04/01/2021   Shock (HCC) 10/29/2020   Sleep apnea    Not on CPAP   Stroke (HCC) 2018   Thrombocytosis 08/02/2017   Tobacco abuse    UGI bleed    Urinary incontinence     Past Surgical History:  Procedure Laterality Date   ANGIOPLASTY ILLIAC ARTERY Right 04/23/2021   Procedure: ANGIOPLASTY AND STENT SUPERFICIAL FEMORAL ARTERY;  Surgeon: Leonie Douglas, MD;  Location: [facility] OR;  Service: Vascular;  Laterality: Right;   AORTOGRAM  04/23/2021   Procedure: AORTOGRAM;  Surgeon: Leonie Douglas, MD;  Location: [facility] OR;  Service: Vascular;;   AORTOGRAM Left 04/30/2021   Procedure: left lower extremity  angiogram with second order cannulation;  Surgeon: Leonie Douglas, MD;  Location[facility] OR;  Service: Vascular;  Laterality: Left;   BIOPSY  11/12/2020   Procedure: BIOPSY;  Surgeon: Dolores Frame, MD;  Location: [facility] ENDO SUITE;  Service: Gastroenterology;;   BREAST BIOPSY     CARDIAC CATHETERIZATION N/A 07/28/2016   Procedure: Left Heart Cath and Coronary Angiography;  Surgeon: Corky Crafts, MD;  Location: [facility] INVASIVE CV LAB;  Service: Cardiovascular;   Laterality: N/A;   COLONOSCOPY N/A 05/10/2019   Procedure: COLONOSCOPY;  Surgeon: West Bali, MD;  Location: [facility] SU[facility];  Service: Endoscopy;  Laterality: N/A;  Phenergan 12.5 mg IV in pre-op   DILATION AND CURETTAGE OF UTERUS     ESOPHAGOGASTRODUODENOSCOPY (EGD) WITH PROPOFOL N/A 11/12/2020   Procedure: ESOPHAGOGASTRODUODENOSCOPY (EGD) WITH PROPOFOL;  Surgeon: Dolores Frame, MD;  Location: [facility];  Service: Gastroenterology;  Laterality: N/A;   I & D EXTREMITY Bilateral 09/22/2017   Procedure: BILATERAL DEBRIDEMENT LEG/FOOT ULCERS, APPLY VERAFLO WOUND VAC;  Surgeon: Nadara Mustard, MD;  Location: [facility] OR;  Service: Orthopedics;  Laterality: Bilateral;   I & D EXTREMITY Right 10/11/2018   Procedure: IRRIGATION AND DEBRIDEMENT RIGHT HAND;  Surgeon: Dominica Severin, MD;  Location: [facility] OR;  Service: Orthopedics;  Laterality: Right;   I & D EXTREMITY Right 10/13/2018   Procedure: REPEAT IRRIGATION AND DEBRIDEMENT RIGHT HAND;  Surgeon: Dominica Severin, MD;  Location: [facility] OR;  Service: Orthopedics;  Laterality: Right;   I & D EXTREMITY Right 11/22/2018   Procedure: IRRIGATION AND DEBRIDEMENT AND PINNING RIGHT HAND;  Surgeon: Dominica Severin, MD;  Location: [facility] OR;  Service: Orthopedics;  Laterality: Right;   IR RADIOLOGIST EVAL & MGMT  07/05/2018   LOWER EXTREMITY ANGIOGRAM Bilateral 04/23/2021   Procedure: RIGHT  AND LEFT LOWER EXTREMITY ANGIOGRAM;  Surgeon: Leonie Douglas, MD;  Location: [facility] OR;  Service: Vascular;  Laterality: Bilateral;   POLYPECTOMY  05/10/2019   Procedure: POLYPECTOMY;  Surgeon: West Bali, MD;  Location: [facility];  Service: Endoscopy;;   SKIN SPLIT GRAFT Bilateral 09/28/2017   Procedure: BILATERAL SPLIT THICKNESS SKIN GRAFT LEGS/FEET AND APPLY VAC;  Surgeon: Nadara Mustard, MD;  Location: [facility] OR;  Service: Orthopedics;  Laterality: Bilateral;   SKIN SPLIT GRAFT Right 11/22/2018   Procedure: SKIN GRAFT SPLIT THICKNESS;  Surgeon: Dominica Severin, MD;   Location: [facility] OR;  Service: Orthopedics;  Laterality: Right;    Family History  Problem Relation Age of Onset   Hypertension Mother    Heart attack Mother    Hypertension Father        CABG    Hypertension Sister    Hypertension Brother    Hypertension Sister    Cancer Sister        breast    Arthritis Other    Cancer Other    Diabetes Other    Asthma Other    Hypertension Daughter    Hypertension Son     Social History   Socioeconomic History   Marital status: Widowed    Spouse name: Not on file   Number of children: 2   Years of education: 9   Highest education level: 9th grade  Occupational History   Occupation: Disabled  Tobacco Use   Smoking status: Former    Current packs/day: 0.00    Average packs/day: 0.3 packs/day for 20.0 years (5.0 ttl pk-yrs)    Types: Cigarettes    Start date: 06/03/1997  Quit date: 06/03/2017    Years since quitting: 6.2    Passive exposure: Never   Smokeless tobacco: Never  Vaping Use   Vaping status: Never Used  Substance and Sexual Activity   Alcohol use: Not Currently    Alcohol/week: 0.0 standard drinks of alcohol    Comment: Quit in 2017   Drug use: Not Currently    Frequency: 3.0 times per week    Types: Marijuana, Cocaine    Comment: none since August 2018   Sexual activity: Not on file  Other Topics Concern   Not on file  Social History Narrative   Right handed   Drinks 1-2 cups caffeine daily   Social Determinants of Health   Financial Resource Strain: Medium Risk (10/15/2020)   Overall Financial Resource Strain (CARDIA)    Difficulty of Paying Living Expenses: Somewhat hard  Food Insecurity: Food Insecurity Present (10/15/2020)   Hunger Vital Sign    Worried About Running Out of Food in the Last Year: Sometimes true    Ran Out of Food in the Last Year: Sometimes true  Transportation Needs: No Transportation Needs (10/15/2020)   PRAPARE - Administrator, Civil Service (Medical): No    Lack of  Transportation (Non-Medical): No  Physical Activity: Insufficiently Active (10/15/2020)   Exercise Vital Sign    Days of Exercise per Week: 7 days    Minutes of Exercise per Session: 20 min  Stress: Stress Concern Present (10/15/2020)   [employer] of Occupational Health - Occupational Stress Questionnaire    Feeling of Stress : To some extent  Social Connections: Moderately Isolated (10/15/2020)   Social Connection and Isolation Panel [NHANES]    Frequency of Communication with Friends and Family: More than three times a week    Frequency of Social Gatherings with Friends and Family: More than three times a week    Attends Religious Services: More than 4 times per year    Active Member of [employer] or Organizations: No    Attends Banker Meetings: Never    Marital Status: Widowed  Intimate Partner Violence: Not At Risk (10/15/2020)   Humiliation, Afraid, Rape, and Kick questionnaire    Fear of Current or Ex-Partner: No    Emotionally Abused: No    Physically Abused: No    Sexually Abused: No    Allergies  Allergen Reactions   Sodium Hypochlorite Anaphylaxis    (Bleach wipes) "I am not able to breathe"    Current Outpatient Medications  Medication Sig Dispense Refill   Accu-Chek FastClix Lancets MISC USE TO CHECK BLOOD SUGAR WITH MEALS AND AT BEDTIME. 150 each 0   ACCU-CHEK GUIDE test strip USE TO CHECK BLOOD SUGAR WITH MEALS AND AT BEDTIME. 100 strip 0   acetaminophen (TYLENOL) 325 MG tablet Take 2 tablets (650 mg total) by mouth every 6 (six) hours as needed for headache, fever or mild pain. (Patient taking differently: Take 650 mg by mouth every 6 (six) hours as needed for fever (or pain).)     ammonium lactate (AMLACTIN) 12 % lotion Apply 1 Application topically as needed for dry skin. 400 g 4   ARIPiprazole (ABILIFY) 5 MG tablet Take 5 mg by mouth daily.     ascorbic acid (VITAMIN C) 500 MG tablet TAKE (1) TABLET BY MOUTH TWICE DAILY. 60 tablet 0   ASPIRIN  ADULT LOW STRENGTH 81 MG tablet TAKE (1) TABLET BY MOUTH ONCE DAILY. 30 tablet 0   BD AUTOSHIELD DUO 30G  X 5 MM MISC USE TO INJECT INSULIN WITH MEALS AND AT BEDTIME. 100 each 0   Blood Glucose Monitoring Suppl (ACCU-CHEK GUIDE) w/Device KIT      calcitRIOL (ROCALTROL) 0.25 MCG capsule Take 0.25 mcg by mouth daily.     Dextromethorphan-guaiFENesin (GERI-TUSSIN DM) 10-100 MG/5ML liquid Take 10 mLs by mouth every 12 (twelve) hours.     ELIQUIS 5 MG TABS tablet TAKE (1) TABLET BY MOUTH TWICE DAILY. 60 tablet 0   FLUoxetine (PROZAC) 40 MG capsule TAKE (2) CAPSULES BY MOUTH ONCE DAILY. 60 capsule 0   fluticasone (FLONASE) 50 MCG/ACT nasal spray Place 2 sprays into both nostrils daily. 16 g 6   furosemide (LASIX) 20 MG tablet Take 20 mg by mouth 2 (two) times daily.     HUMALOG KWIKPEN 100 UNIT/ML KwikPen Inject 1-10 Units into the skin See admin instructions. Inject 1-10 units into the skin before meals and at bedtime, PER SLIDING SCALE: BGL 151-200 = 1 unit; 201-250 = 2 units; 251-300 = 4 units; 301-350 = 6 units; 351-400 = 8 units; 401-450 = 10 units     levETIRAcetam (KEPPRA) 500 MG tablet TAKE (1) TABLET BY MOUTH TWICE DAILY. 60 tablet 0   loperamide (IMODIUM A-D) 2 MG tablet Take 4 mg by mouth 4 (four) times daily as needed for diarrhea or loose stools.     LORazepam (ATIVAN) 0.5 MG tablet TAKE (1) TABLET BY MOUTH TWICE DAILY. 60 tablet 0   metoprolol succinate (TOPROL-XL) 25 MG 24 hr tablet TAKE (1) TABLET BY MOUTH IN THE MORNING. 30 tablet 0   Multiple Vitamins-Minerals (THERA-M) TABS TAKE (1) TABLET BY MOUTH ONCE DAILY. 30 tablet 0   NIFEdipine (ADALAT CC) 60 MG 24 hr tablet Take 60 mg by mouth daily.     olopatadine (PATADAY) 0.1 % ophthalmic solution Place 1 drop into both eyes 2 (two) times daily.     oxybutynin (DITROPAN-XL) 5 MG 24 hr tablet TAKE (1) TABLET BY MOUTH ONCE DAILY. 30 tablet 0   rosuvastatin (CRESTOR) 10 MG tablet Take 10 mg by mouth at bedtime.     sodium bicarbonate 650 MG  tablet Take by mouth.     thiamine (VITAMIN B1) 100 MG tablet TAKE (1) TABLET BY MOUTH ONCE DAILY. 30 tablet 0   traMADol (ULTRAM) 50 MG tablet Take 0.5 tablets by mouth every 6 (six) hours as needed for moderate pain or severe pain.     No current facility-administered medications for this visit.    PHYSICAL EXAM Vitals:   09/07/23 0856  BP: (!) 169/109  Temp: 97.9 F (36.6 C)  Weight: 181 lb (82.1 kg)  Height: 5\' 1"  (1.549 m)    Woman in no distress In a wheelchair Regular rate and rhythm Unlabored breathing Palpable radial pulses   PERTINENT LABORATORY AND RADIOLOGIC DATA  Most recent CBC    Latest Ref Rng & Units 03/31/2023    8:28 AM 09/07/2022   10:35 PM 07/25/2021    2:11 PM  CBC  WBC 3.4 - 10.8 x10E3/uL 5.7  8.2  6.9   Hemoglobin 11.1 - 15.9 g/dL 78.4  69.6  8.5   Hematocrit 34.0 - 46.6 % 34.1  39.8  28.4   Platelets 150 - 450 x10E3/uL 208  285  331      Most recent CMP    Latest Ref Rng & Units 04/13/2023   12:09 AM 03/31/2023    8:28 AM 09/07/2022   10:35 PM  CMP  Glucose 70 -  99 mg/dL 657  846  962   BUN 6 - 20 mg/dL 62  48  43   Creatinine 0.44 - 1.00 mg/dL 9.52  8.41  3.24   Sodium 135 - 145 mmol/L 137  141  141   Potassium 3.5 - 5.1 mmol/L 5.1  5.2  4.3   Chloride 98 - 111 mmol/L 111  111  109   CO2 22 - 32 mmol/L 17  18  19    Calcium 8.9 - 10.3 mg/dL 8.5  8.7  9.3   Total Protein 6.0 - 8.5 g/dL  5.5  6.9   Total Bilirubin 0.0 - 1.2 mg/dL  <4.0  0.5   Alkaline Phos 44 - 121 IU/L  120  110   AST 0 - 40 IU/L  29  30   ALT 0 - 32 IU/L  34  27     Renal function CrCl cannot be calculated (Patient's most recent lab result is older than the maximum 21 days allowed.).  Hgb A1c MFr Bld (%)  Date Value  03/31/2023 6.1 (H)    LDL Cholesterol (Calc)  Date Value Ref Range Status  12/07/2018 73 mg/dL (calc) Final    Comment:    Reference range: <100 . Desirable range <100 mg/dL for primary prevention;   <70 mg/dL for patients with CHD or diabetic  patients  with > or = 2 CHD risk factors. Marland Kitchen LDL-C is now calculated using the Martin-Hopkins  calculation, which is a validated novel method providing  better accuracy than the Friedewald equation in the  estimation of LDL-C.  Horald Pollen et al. Lenox Ahr. 1027;253(66): 2061-2068  (http://education.[facility].com/faq/FAQ164)    LDL Chol Calc (NIH)  Date Value Ref Range Status  03/31/2023 108 (H) 0 - 99 mg/dL Final   Direct LDL  Date Value Ref Range Status  01/08/2010 122 (H) mg/dL Final    Comment:    See lab report for associated comment(s)    A mapping shows no usable vein in the upper extremities bilaterally for fistula creation  Rande Brunt. Lenell Antu, MD FACS [facility] Phone Number: [phone] 09/07/2023 11:01 AM   Total time spent on preparing this encounter including chart review, data review, collecting history, examining the patient, coordinating care for this established patient, 30 minutes  Portions of this report may have been transcribed using voice recognition software.  Every effort has been made to ensure accuracy; however, inadvertent computerized transcription errors may still be present.

## 2023-09-07 ENCOUNTER — Ambulatory Visit (INDEPENDENT_AMBULATORY_CARE_PROVIDER_SITE_OTHER): Payer: 59 | Admitting: Vascular Surgery

## 2023-09-07 ENCOUNTER — Encounter: Payer: Self-pay | Admitting: Vascular Surgery

## 2023-09-07 VITALS — BP 169/109 | Temp 97.9°F | Ht 61.0 in | Wt 181.0 lb

## 2023-09-07 DIAGNOSIS — N184 Chronic kidney disease, stage 4 (severe): Secondary | ICD-10-CM

## 2023-09-08 DIAGNOSIS — R2689 Other abnormalities of gait and mobility: Secondary | ICD-10-CM | POA: Diagnosis not present

## 2023-09-10 DIAGNOSIS — R2689 Other abnormalities of gait and mobility: Secondary | ICD-10-CM | POA: Diagnosis not present

## 2023-09-10 NOTE — Patient Outreach (Signed)
[facility] Assistant attempted to call patient on today regarding preventative mammogram screening. No answer from patient after multiple rings. Assistant left confidential voicemail for patient to return call.  Will call back patient back for final attempt.  Baruch Gouty [facility] Assistant [facility] Population Health [phone]

## 2023-09-16 ENCOUNTER — Encounter (HOSPITAL_COMMUNITY): Payer: Self-pay

## 2023-09-16 ENCOUNTER — Other Ambulatory Visit: Payer: Self-pay

## 2023-09-16 ENCOUNTER — Emergency Department (HOSPITAL_COMMUNITY)
Admission: EM | Admit: 2023-09-16 | Discharge: 2023-09-20 | Disposition: A | Discharge status: Home / Self Care | Payer: 59 | Attending: Emergency Medicine | Admitting: Emergency Medicine

## 2023-09-16 DIAGNOSIS — E1122 Type 2 diabetes mellitus with diabetic chronic kidney disease: Secondary | ICD-10-CM | POA: Insufficient documentation

## 2023-09-16 DIAGNOSIS — N184 Chronic kidney disease, stage 4 (severe): Secondary | ICD-10-CM | POA: Diagnosis not present

## 2023-09-16 DIAGNOSIS — R7401 Elevation of levels of liver transaminase levels: Secondary | ICD-10-CM | POA: Diagnosis not present

## 2023-09-16 DIAGNOSIS — N189 Chronic kidney disease, unspecified: Secondary | ICD-10-CM

## 2023-09-16 DIAGNOSIS — I130 Hypertensive heart and chronic kidney disease with heart failure and stage 1 through stage 4 chronic kidney disease, or unspecified chronic kidney disease: Secondary | ICD-10-CM | POA: Insufficient documentation

## 2023-09-16 DIAGNOSIS — Z789 Other specified health status: Secondary | ICD-10-CM

## 2023-09-16 DIAGNOSIS — D649 Anemia, unspecified: Secondary | ICD-10-CM | POA: Diagnosis not present

## 2023-09-16 DIAGNOSIS — Z79899 Other long term (current) drug therapy: Secondary | ICD-10-CM | POA: Diagnosis not present

## 2023-09-16 DIAGNOSIS — I509 Heart failure, unspecified: Secondary | ICD-10-CM | POA: Diagnosis not present

## 2023-09-16 DIAGNOSIS — Z7951 Long term (current) use of inhaled steroids: Secondary | ICD-10-CM | POA: Diagnosis not present

## 2023-09-16 DIAGNOSIS — J449 Chronic obstructive pulmonary disease, unspecified: Secondary | ICD-10-CM | POA: Diagnosis not present

## 2023-09-16 DIAGNOSIS — Z794 Long term (current) use of insulin: Secondary | ICD-10-CM | POA: Insufficient documentation

## 2023-09-16 DIAGNOSIS — Z7982 Long term (current) use of aspirin: Secondary | ICD-10-CM | POA: Insufficient documentation

## 2023-09-16 DIAGNOSIS — Z7901 Long term (current) use of anticoagulants: Secondary | ICD-10-CM | POA: Insufficient documentation

## 2023-09-16 DIAGNOSIS — R799 Abnormal finding of blood chemistry, unspecified: Secondary | ICD-10-CM | POA: Diagnosis present

## 2023-09-16 DIAGNOSIS — I129 Hypertensive chronic kidney disease with stage 1 through stage 4 chronic kidney disease, or unspecified chronic kidney disease: Secondary | ICD-10-CM | POA: Diagnosis not present

## 2023-09-16 LAB — CBC WITH DIFFERENTIAL/PLATELET
Abs Immature Granulocytes: 0.01 10*3/uL (ref 0.00–0.07)
Basophils Absolute: 0.1 10*3/uL (ref 0.0–0.1)
Basophils Relative: 1 %
Eosinophils Absolute: 0.3 10*3/uL (ref 0.0–0.5)
Eosinophils Relative: 5 %
HCT: 29.7 % — ABNORMAL LOW (ref 36.0–46.0)
Hemoglobin: 9.3 g/dL — ABNORMAL LOW (ref 12.0–15.0)
Immature Granulocytes: 0 %
Lymphocytes Relative: 26 %
Lymphs Abs: 1.7 10*3/uL (ref 0.7–4.0)
MCH: 29.4 pg (ref 26.0–34.0)
MCHC: 31.3 g/dL (ref 30.0–36.0)
MCV: 94 fL (ref 80.0–100.0)
Monocytes Absolute: 0.4 10*3/uL (ref 0.1–1.0)
Monocytes Relative: 6 %
Neutro Abs: 4.1 10*3/uL (ref 1.7–7.7)
Neutrophils Relative %: 62 %
Platelets: 197 10*3/uL (ref 150–400)
RBC: 3.16 MIL/uL — ABNORMAL LOW (ref 3.87–5.11)
RDW: 13.8 % (ref 11.5–15.5)
WBC: 6.6 10*3/uL (ref 4.0–10.5)
nRBC: 0 % (ref 0.0–0.2)

## 2023-09-16 LAB — COMPREHENSIVE METABOLIC PANEL
ALT: 65 U/L — ABNORMAL HIGH (ref 0–44)
AST: 47 U/L — ABNORMAL HIGH (ref 15–41)
Albumin: 3 g/dL — ABNORMAL LOW (ref 3.5–5.0)
Alkaline Phosphatase: 76 U/L (ref 38–126)
Anion gap: 9 (ref 5–15)
BUN: 56 mg/dL — ABNORMAL HIGH (ref 6–20)
CO2: 20 mmol/L — ABNORMAL LOW (ref 22–32)
Calcium: 8.9 mg/dL (ref 8.9–10.3)
Chloride: 114 mmol/L — ABNORMAL HIGH (ref 98–111)
Creatinine, Ser: 3.46 mg/dL — ABNORMAL HIGH (ref 0.44–1.00)
GFR, Estimated: 15 mL/min — ABNORMAL LOW (ref >60)
Glucose, Bld: 176 mg/dL — ABNORMAL HIGH (ref 70–99)
Potassium: 3.9 mmol/L (ref 3.5–5.1)
Sodium: 143 mmol/L (ref 135–145)
Total Bilirubin: 0.6 mg/dL (ref <1.2)
Total Protein: 6.5 g/dL (ref 6.5–8.1)

## 2023-09-16 NOTE — ED Triage Notes (Signed)
Pt left [facility] assisted living to move back home with son on 09/14/2023  Since she has been home she has not taken any medication and wants her BP and blood work checked. Pt stated she has a hx of CHF, HTN and DM. Pt stated she usually takes insulin and metformin for DM  Pt also requests placement in another assisted living since she can not take care of herself and her son is not helping her.  Pt denies all pain

## 2023-09-17 ENCOUNTER — Telehealth: Payer: Self-pay

## 2023-09-17 DIAGNOSIS — R7401 Elevation of levels of liver transaminase levels: Secondary | ICD-10-CM | POA: Diagnosis not present

## 2023-09-17 LAB — CBG MONITORING, ED
Glucose-Capillary: 116 mg/dL — ABNORMAL HIGH (ref 70–99)
Glucose-Capillary: 216 mg/dL — ABNORMAL HIGH (ref 70–99)
Glucose-Capillary: 124 mg/dL — ABNORMAL HIGH (ref 70–99)

## 2023-09-17 MED ORDER — THIAMINE MONONITRATE 100 MG PO TABS
100.0000 mg | ORAL_TABLET | Freq: Every day | ORAL | Status: DC
  Administered 2023-09-17 – 2023-09-20 (×4): 100 mg via ORAL
  Filled 2023-09-17 (×5): qty 1

## 2023-09-17 MED ORDER — NIFEDIPINE ER OSMOTIC RELEASE 30 MG PO TB24
60.0000 mg | ORAL_TABLET | Freq: Every day | ORAL | Status: DC
  Administered 2023-09-17 – 2023-09-20 (×4): 60 mg via ORAL
  Filled 2023-09-17 (×4): qty 2

## 2023-09-17 MED ORDER — APIXABAN 5 MG PO TABS
5.0000 mg | ORAL_TABLET | Freq: Two times a day (BID) | ORAL | Status: DC
  Administered 2023-09-17 – 2023-09-20 (×7): 5 mg via ORAL
  Filled 2023-09-17 (×7): qty 1

## 2023-09-17 MED ORDER — INSULIN ASPART 100 UNIT/ML IJ SOLN
0.0000 [IU] | Freq: Three times a day (TID) | INTRAMUSCULAR | Status: DC
Start: 2023-09-17 — End: 2023-09-20
  Administered 2023-09-17: 2 [IU] via SUBCUTANEOUS
  Administered 2023-09-19: 1 [IU] via SUBCUTANEOUS
  Filled 2023-09-17 (×2): qty 1

## 2023-09-17 MED ORDER — ARIPIPRAZOLE 5 MG PO TABS
5.0000 mg | ORAL_TABLET | Freq: Every day | ORAL | Status: DC
  Administered 2023-09-17 – 2023-09-20 (×4): 5 mg via ORAL
  Filled 2023-09-17 (×4): qty 1

## 2023-09-17 MED ORDER — ASPIRIN 81 MG PO TBEC
81.0000 mg | DELAYED_RELEASE_TABLET | Freq: Every day | ORAL | Status: DC
  Administered 2023-09-17 – 2023-09-20 (×4): 81 mg via ORAL
  Filled 2023-09-17 (×4): qty 1

## 2023-09-17 MED ORDER — OLOPATADINE HCL 0.1 % OP SOLN
1.0000 [drp] | Freq: Two times a day (BID) | OPHTHALMIC | Status: DC
  Administered 2023-09-17 – 2023-09-20 (×7): 1 [drp] via OPHTHALMIC
  Filled 2023-09-17: qty 5

## 2023-09-17 MED ORDER — CALCITRIOL 0.25 MCG PO CAPS
0.2500 ug | ORAL_CAPSULE | Freq: Every day | ORAL | Status: DC
  Administered 2023-09-17 – 2023-09-20 (×4): 0.25 ug via ORAL
  Filled 2023-09-17 (×4): qty 1

## 2023-09-17 MED ORDER — TRAMADOL HCL 50 MG PO TABS
25.0000 mg | ORAL_TABLET | Freq: Four times a day (QID) | ORAL | Status: DC | PRN
  Administered 2023-09-19: 25 mg via ORAL
  Filled 2023-09-17: qty 1

## 2023-09-17 MED ORDER — INSULIN ASPART 100 UNIT/ML IJ SOLN: 3.0000 [IU] | Freq: Three times a day (TID) | INTRAMUSCULAR | Status: DC

## 2023-09-17 MED ORDER — ADULT MULTIVITAMIN W/MINERALS CH
1.0000 | ORAL_TABLET | Freq: Every day | ORAL | Status: DC
  Administered 2023-09-17 – 2023-09-20 (×4): 1 via ORAL
  Filled 2023-09-17 (×4): qty 1

## 2023-09-17 MED ORDER — OXYBUTYNIN CHLORIDE ER 5 MG PO TB24
5.0000 mg | ORAL_TABLET | Freq: Every day | ORAL | Status: DC
  Administered 2023-09-17 – 2023-09-19 (×3): 5 mg via ORAL
  Filled 2023-09-17 (×3): qty 1

## 2023-09-17 MED ORDER — FLUOXETINE HCL 20 MG PO CAPS
80.0000 mg | ORAL_CAPSULE | Freq: Every day | ORAL | Status: DC
  Administered 2023-09-17 – 2023-09-20 (×4): 80 mg via ORAL
  Filled 2023-09-17 (×4): qty 4

## 2023-09-17 MED ORDER — TUBERCULIN PPD 5 UNIT/0.1ML ID SOLN
5.0000 [IU] | INTRADERMAL | Status: AC
  Administered 2023-09-17: 5 [IU] via INTRADERMAL
  Filled 2023-09-17: qty 0.1

## 2023-09-17 MED ORDER — FLUTICASONE PROPIONATE 50 MCG/ACT NA SUSP
2.0000 | Freq: Every day | NASAL | Status: DC
  Administered 2023-09-17 – 2023-09-20 (×4): 2 via NASAL
  Filled 2023-09-17 (×2): qty 16

## 2023-09-17 MED ORDER — SODIUM BICARBONATE 650 MG PO TABS
650.0000 mg | ORAL_TABLET | Freq: Two times a day (BID) | ORAL | Status: DC
  Administered 2023-09-17 – 2023-09-20 (×7): 650 mg via ORAL
  Filled 2023-09-17 (×7): qty 1

## 2023-09-17 MED ORDER — METOPROLOL SUCCINATE ER 25 MG PO TB24
25.0000 mg | ORAL_TABLET | Freq: Every day | ORAL | Status: DC
  Administered 2023-09-17 – 2023-09-20 (×4): 25 mg via ORAL
  Filled 2023-09-17 (×4): qty 1

## 2023-09-17 MED ORDER — FUROSEMIDE 40 MG PO TABS
20.0000 mg | ORAL_TABLET | Freq: Two times a day (BID) | ORAL | Status: DC
  Administered 2023-09-17 – 2023-09-20 (×6): 20 mg via ORAL
  Filled 2023-09-17 (×6): qty 1

## 2023-09-17 MED ORDER — ROSUVASTATIN CALCIUM 10 MG PO TABS
10.0000 mg | ORAL_TABLET | Freq: Every day | ORAL | Status: DC
  Administered 2023-09-17 – 2023-09-19 (×3): 10 mg via ORAL
  Filled 2023-09-17 (×3): qty 1

## 2023-09-17 MED ORDER — INSULIN ASPART 100 UNIT/ML IJ SOLN: 0.0000 [IU] | Freq: Every day | INTRAMUSCULAR | Status: DC

## 2023-09-17 MED ORDER — FUROSEMIDE 40 MG PO TABS: 20.0000 mg | ORAL_TABLET | Freq: Two times a day (BID) | ORAL | Status: DC

## 2023-09-17 MED ORDER — VITAMIN C 500 MG PO TABS
500.0000 mg | ORAL_TABLET | Freq: Every day | ORAL | Status: DC
  Administered 2023-09-17 – 2023-09-20 (×4): 500 mg via ORAL
  Filled 2023-09-17 (×5): qty 1

## 2023-09-17 MED ORDER — ACETAMINOPHEN 325 MG PO TABS: 650.0000 mg | ORAL_TABLET | Freq: Four times a day (QID) | ORAL | Status: DC | PRN

## 2023-09-17 MED ORDER — LEVETIRACETAM 500 MG PO TABS
500.0000 mg | ORAL_TABLET | Freq: Two times a day (BID) | ORAL | Status: DC
  Administered 2023-09-17 – 2023-09-20 (×7): 500 mg via ORAL
  Filled 2023-09-17 (×7): qty 1

## 2023-09-17 NOTE — ED Notes (Signed)
Pt is requesting that monitoring equipment be removed, which was performed. Call bell within reach.
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
## 2023-09-17 NOTE — ED Notes (Signed)
Trudy with [facility] visited with pt and states she has a bed and can accept pt Monday. Pt will need a TB test ordered and red 48 hours later and documentation will need to be sent to facility. CSW to follow for TB results Monday and follow up with Trudy on getting pt placed. TOC to follow.

## 2023-09-17 NOTE — ED Notes (Signed)
PT in room with patient.

## 2023-09-17 NOTE — Telephone Encounter (Signed)
Copied from CRM [id number]. Topic: General - Other >> Sep 17, 2023  8:34 AM Amy B wrote: Reason for CRM: [name] with [facility] long term care called to inform that the patient is no longer under their care.  She is now living with her son.  For questions, call [phone].

## 2023-09-17 NOTE — ED Notes (Signed)
TOC notes STAT SNF consult. No PT order placed when consult was placed. CSW requested that MD place PT eval order as TOC cannot work on SNF referral without a PT eval.   CSW also notes per chart review that pt was a resident at [facility] but left 11/19 to return home with her son who was going to care for her, it seems per notes that pts son has not be caring for pt and she is requesting placement. CSW spoke to [place] with [facility] to see if they would consider taking pt back as a resident, she will speak with admin Tammy and get back in touch with CSW. TOC to follow.

## 2023-09-17 NOTE — Evaluation (Signed)
Physical Therapy Evaluation Patient Details Name: [name] MRN: [id number] DOB: [birth year] Today's Date: 09/17/2023  History of Present Illness  [name] is a 57 y.o. female.     The history is provided by the patient.   She has history of hypertension, diabetes, hyperlipidemia, heart failure, chronic kidney disease, COPD, paroxysmal atrial fibrillation anticoagulated on apixaban, seizures and comes in because of need for placement.  She had been in an assisted living facility but left to stay with her son.  However, she states that her son is not able to give her the assistance that she needs.  She is not able to lay out her own medication and her son has not been able to do so so she is not taken any of her medication the last 3 days.  She realizes that she needs to go back into another assisted living facility.    Clinical Impression  Patient sitting up in bed on therapist arrival; taking medication with nursing present. Patient states she was living at [facility] and thought she could live with her son who works 3rd shift but ultimately states that neither she nor her son were able to handle her care; "we weren't ready".  Patient performs supine to sit with SBA to CGA for safety. Sit to stand to RW with CGA.  Patient is able to ambulate x 50 ft with RW and CGA; slow gait speed and forward flexed trunk.  Patient returns to bed with min A for legs to return to supine from sitting.  She reports some ankle and back pain after walking and states she primarily uses a WC for mobility at her baseline.  patient left in bed with call button in reach and nursing notified of mobility status.  Patient will benefit from continued skilled therapy services during the remainder of her hospital stay and at the next recommended venue of care to address deficits and promote return to optimal function.            If plan is discharge home, recommend the following: A little help with walking and/or  transfers;A little help with bathing/dressing/bathroom;Assistance with cooking/housework;Help with stairs or ramp for entrance   Can travel by private vehicle   Yes    Equipment Recommendations Rolling walker (2 wheels)  Recommendations for Other Services       Functional Status Assessment Patient has had a recent decline in their functional status and demonstrates the ability to make significant improvements in function in a reasonable and predictable amount of time.     Precautions / Restrictions Precautions Precautions: Fall Precaution Comments: uses WC for primary mobility at baseline Restrictions Weight Bearing Restrictions: No      Mobility  Bed Mobility Overal bed mobility: Needs Assistance Bed Mobility: Supine to Sit, Sit to Supine (for legs only)                Transfers Overall transfer level: Needs assistance Equipment used: Rolling walker (2 wheels) Transfers: Sit to/from Stand Sit to Stand: Contact guard assist           General transfer comment: takes extra time    Ambulation/Gait Ambulation/Gait assistance: Contact guard assist Gait Distance (Feet): 50 Feet Assistive device: Rolling walker (2 wheels) Gait Pattern/deviations: Decreased step length - right, Decreased step length - left, Trunk flexed Gait velocity: decreased        Careers information officer  Tilt Bed    Modified Rankin (Stroke Patients Only)       Balance Overall balance assessment: Needs assistance Sitting-balance support: Single extremity supported, Feet unsupported Sitting balance-Leahy Scale: Good Sitting balance - Comments: good sitting balance on edge of bed   Standing balance support: Bilateral upper extremity supported, During functional activity, Reliant on assistive device for balance Standing balance-Leahy Scale: Fair Standing balance comment: fair to good standing balance with RW                              Pertinent Vitals/Pain Pain Assessment Pain Assessment: 0-10 Pain Score: 8  Pain Location: bilateral ankles Pain Intervention(s): Limited activity within patient's tolerance, Monitored during session    Home Living Family/patient expects to be discharged to:: Assisted living                 Home Equipment: Wheelchair - manual Additional Comments: needs RW    Prior Function Prior Level of Function : Needs assist       Physical Assist : Mobility (physical);ADLs (physical) (needs assist for bathing and for fastening her bra)             Extremity/Trunk Assessment   Upper Extremity Assessment Upper Extremity Assessment: Generalized weakness    Lower Extremity Assessment Lower Extremity Assessment: Generalized weakness    Cervical / Trunk Assessment Cervical / Trunk Assessment: Kyphotic  Communication   Communication Communication: No apparent difficulties  Cognition Arousal: Alert Behavior During Therapy: WFL for tasks assessed/performed Overall Cognitive Status: Within Functional Limits for tasks assessed                                 General Comments: pleasant and cooperative with therapy        General Comments      Exercises     Assessment/Plan    PT Assessment Patient needs continued PT services  PT Problem List Decreased strength;Decreased activity tolerance;Decreased balance;Decreased mobility;Pain       PT Treatment Interventions DME instruction;Gait training;Functional mobility training;Therapeutic activities;Therapeutic exercise;Balance training;Neuromuscular re-education;Patient/family education    PT Goals (Current goals can be found in the Care Plan section)  Acute Rehab PT Goals Patient Stated Goal: return to assisted living PT Goal Formulation: With patient Time For Goal Achievement: 10/01/23 Potential to Achieve Goals: Good    Frequency Min 3X/week     Co-evaluation               AM-PAC PT "6  Clicks" Mobility  Outcome Measure Help needed turning from your back to your side while in a flat bed without using bedrails?: A Little Help needed moving from lying on your back to sitting on the side of a flat bed without using bedrails?: A Little Help needed moving to and from a bed to a chair (including a wheelchair)?: A Little Help needed standing up from a chair using your arms (e.g., wheelchair or bedside chair)?: A Little Help needed to walk in hospital room?: A Lot   6 Click Score: 14    End of Session Equipment Utilized During Treatment: Gait belt Activity Tolerance: Patient tolerated treatment well Patient left: in bed;with call bell/phone within reach Nurse Communication: Mobility status PT Visit Diagnosis: Muscle weakness (generalized) (M62.81);Other abnormalities of gait and mobility (R26.89)    Time: 1020-1040 PT Time Calculation (min) (ACUTE ONLY): 20 min   Charges:  PT Evaluation $PT Eval Low Complexity: 1 Low   PT General Charges $$ ACUTE PT VISIT: 1 Visit         11:16 AM, 09/17/23 Mang Hazelrigg Small Raahi Korber MPT [facility] [place] [phone] [phone]

## 2023-09-17 NOTE — ED Provider Notes (Signed)
Emergency Medicine Observation Re-evaluation Note  [name] is a 57 y.o. female, seen on rounds today.  Pt initially presented to the ED for complaints of Requesting Placement  Currently, the patient is sleeping.  Physical Exam  BP (!) 155/89   Pulse 95   Temp 98.7 F (37.1 C) (Oral)   Resp 18   Ht 5\' 1"  (1.549 m)   Wt 78.5 kg   LMP 05/19/2016   SpO2 99%   BMI 32.69 kg/m  Physical Exam General: No acute distress Cardiac: Well-perfused Lungs: Nonlabored Psych: Calm  ED Course / MDM  EKG:   I have reviewed the labs performed to date as well as medications administered while in observation.  Recent changes in the last 24 hours include presented for placement.  Plan  Current plan is for [facility] for evaluation of possible placement.    Terrilee Files, MD 09/17/23 [phone]

## 2023-09-17 NOTE — ED Provider Notes (Signed)
[place] EMERGENCY DEPARTMENT AT [facility] Provider Note   CSN: [id number] Arrival date & time: 09/16/23  2206     History  Chief Complaint  Patient presents with   Requesting Placement     [name] is a 57 y.o. female.  The history is provided by the patient.  She has history of hypertension, diabetes, hyperlipidemia, heart failure, chronic kidney disease, COPD, paroxysmal atrial fibrillation anticoagulated on apixaban, seizures and comes in because of need for placement.  She had been in an assisted living facility but left to stay with her son.  However, she states that her son is not able to give her the assistance that she needs.  She is not able to lay out her own medication and her son has not been able to do so so she is not taken any of her medication the last 3 days.  She realizes that she needs to go back into another assisted living facility.   Home Medications Prior to Admission medications   Medication Sig Start Date End Date Taking? Authorizing Provider  Accu-Chek FastClix Lancets MISC USE TO CHECK BLOOD SUGAR WITH MEALS AND AT BEDTIME. 09/03/23   Billie Lade, MD  ACCU-CHEK GUIDE test strip USE TO CHECK BLOOD SUGAR WITH MEALS AND AT BEDTIME. 08/10/23   Billie Lade, MD  acetaminophen (TYLENOL) 325 MG tablet Take 2 tablets (650 mg total) by mouth every 6 (six) hours as needed for headache, fever or mild pain. Patient taking differently: Take 650 mg by mouth every 6 (six) hours as needed for fever (or pain). 11/04/20   Johnson, Clanford L, MD  ammonium lactate (AMLACTIN) 12 % lotion Apply 1 Application topically as needed for dry skin. 01/05/23   Del Nigel Berthold, FNP  ARIPiprazole (ABILIFY) 5 MG tablet Take 5 mg by mouth daily. 07/04/23   [provider]  ascorbic acid (VITAMIN C) 500 MG tablet TAKE (1) TABLET BY MOUTH TWICE DAILY. 06/30/23   Billie Lade, MD  ASPIRIN ADULT LOW STRENGTH 81 MG tablet TAKE (1) TABLET BY MOUTH  ONCE DAILY. 06/30/23   Billie Lade, MD  BD AUTOSHIELD DUO 30G X 5 MM MISC USE TO INJECT INSULIN WITH MEALS AND AT BEDTIME. 09/03/23   Billie Lade, MD  Blood Glucose Monitoring Suppl (ACCU-CHEK GUIDE) w/Device KIT  12/23/22   [provider]  calcitRIOL (ROCALTROL) 0.25 MCG capsule Take 0.25 mcg by mouth daily. 07/12/23   [provider]  Dextromethorphan-guaiFENesin (GERI-TUSSIN DM) 10-100 MG/5ML liquid Take 10 mLs by mouth every 12 (twelve) hours.    [provider]  ELIQUIS 5 MG TABS tablet TAKE (1) TABLET BY MOUTH TWICE DAILY. 06/30/23   Billie Lade, MD  FLUoxetine (PROZAC) 40 MG capsule TAKE (2) CAPSULES BY MOUTH ONCE DAILY. 06/30/23   Billie Lade, MD  fluticasone (FLONASE) 50 MCG/ACT nasal spray Place 2 sprays into both nostrils daily. 05/03/23   Gilmore Laroche, FNP  furosemide (LASIX) 20 MG tablet Take 20 mg by mouth 2 (two) times daily. 07/02/23   [provider]  HUMALOG KWIKPEN 100 UNIT/ML KwikPen Inject 1-10 Units into the skin See admin instructions. Inject 1-10 units into the skin before meals and at bedtime, PER SLIDING SCALE: BGL 151-200 = 1 unit; 201-250 = 2 units; 251-300 = 4 units; 301-350 = 6 units; 351-400 = 8 units; 401-450 = 10 units 03/28/21   [provider]  levETIRAcetam (KEPPRA) 500 MG tablet TAKE (1) TABLET  BY MOUTH TWICE DAILY. 06/30/23   Billie Lade, MD  loperamide (IMODIUM A-D) 2 MG tablet Take 4 mg by mouth 4 (four) times daily as needed for diarrhea or loose stools.    [provider]  LORazepam (ATIVAN) 0.5 MG tablet TAKE (1) TABLET BY MOUTH TWICE DAILY. 06/30/23   Anabel Halon, MD  metoprolol succinate (TOPROL-XL) 25 MG 24 hr tablet TAKE (1) TABLET BY MOUTH IN THE MORNING. 06/30/23   Billie Lade, MD  Multiple Vitamins-Minerals (THERA-M) TABS TAKE (1) TABLET BY MOUTH ONCE DAILY. 06/30/23   Billie Lade, MD  NIFEdipine (ADALAT CC) 60 MG 24 hr tablet Take 60 mg by mouth daily. 08/26/23   [provider]  olopatadine (PATADAY) 0.1 % ophthalmic solution Place 1 drop into both eyes 2 (two) times daily.    [provider]  oxybutynin (DITROPAN-XL) 5 MG 24 hr tablet TAKE (1) TABLET BY MOUTH ONCE DAILY. 06/30/23   Billie Lade, MD  rosuvastatin (CRESTOR) 10 MG tablet Take 10 mg by mouth at bedtime. 08/31/23   [provider]  sodium bicarbonate 650 MG tablet Take by mouth. 07/12/23   [provider]  thiamine (VITAMIN B1) 100 MG tablet TAKE (1) TABLET BY MOUTH ONCE DAILY. 06/30/23   Billie Lade, MD  traMADol (ULTRAM) 50 MG tablet Take 0.5 tablets by mouth every 6 (six) hours as needed for moderate pain or severe pain.    [provider]  albuterol (PROAIR HFA) 108 (90 Base) MCG/ACT inhaler INHALE 2 PUFFS EVERY 6 HOURS AS NEEDED FOR SHORTNESS OF BREATH/WHEEZING. Patient not taking: Reported on 04/21/2021 08/05/20 04/21/21  Kerri Perches, MD      Allergies    Sodium hypochlorite    Review of Systems   Review of Systems  All other systems reviewed and are negative.   Physical Exam Updated Vital Signs BP (!) 165/89   Pulse 97   Temp 99 F (37.2 C) (Oral)   Resp 20   Ht 5\' 1"  (1.549 m)   Wt 78.5 kg   LMP 05/19/2016   SpO2 94%   BMI 32.69 kg/m  Physical Exam Vitals and nursing note reviewed.   57 year old female, resting comfortably and in no acute distress. Vital signs are significant for elevated blood pressure. Oxygen saturation is 94%, which is normal. Head is normocephalic and atraumatic. PERRLA, EOMI. Oropharynx is clear. Neck is nontender and supple without adenopathy. Back is nontender and there is no CVA tenderness. Lungs are clear without rales, wheezes, or rhonchi. Chest is nontender. Heart has regular rate and rhythm without murmur. Abdomen is soft, flat, nontender. Extremities have no cyanosis or edema. Skin is warm and dry without rash. Neurologic: Mental status is normal, cranial nerves are intact, moves all  extremities equally.  ED Results / Procedures / Treatments   Labs (all labs ordered are listed, but only abnormal results are displayed) Labs Reviewed  CBC WITH DIFFERENTIAL/PLATELET - Abnormal; Notable for the following components:      Result Value   RBC 3.16 (*)    Hemoglobin 9.3 (*)    HCT 29.7 (*)    All other components within normal limits  COMPREHENSIVE METABOLIC PANEL - Abnormal; Notable for the following components:   Chloride 114 (*)    CO2 20 (*)    Glucose, Bld 176 (*)    BUN 56 (*)    Creatinine, Ser 3.46 (*)    Albumin 3.0 (*)  AST 47 (*)    ALT 65 (*)    GFR, Estimated 15 (*)    All other components within normal limits   Procedures Procedures    Medications Ordered in ED Medications  acetaminophen (TYLENOL) tablet 650 mg (has no administration in time range)  ARIPiprazole (ABILIFY) tablet 5 mg (has no administration in time range)  ascorbic acid (VITAMIN C) tablet 500 mg (has no administration in time range)  aspirin EC tablet 81 mg (has no administration in time range)  calcitRIOL (ROCALTROL) capsule 0.25 mcg (has no administration in time range)  apixaban (ELIQUIS) tablet 5 mg (has no administration in time range)  FLUoxetine (PROZAC) capsule 40 mg (has no administration in time range)  fluticasone (FLONASE) 50 MCG/ACT nasal spray 2 spray (has no administration in time range)  furosemide (LASIX) tablet 20 mg (has no administration in time range)  levETIRAcetam (KEPPRA) tablet 500 mg (has no administration in time range)  metoprolol succinate (TOPROL-XL) 24 hr tablet 25 mg (has no administration in time range)  Thera-M TABS 1 tablet (has no administration in time range)  NIFEdipine (PROCARDIA-XL/NIFEDICAL-XL) 24 hr tablet 60 mg (has no administration in time range)  olopatadine (PATANOL) 0.1 % ophthalmic solution 1 drop (has no administration in time range)  oxybutynin (DITROPAN-XL) 24 hr tablet 5 mg (has no administration in time range)  rosuvastatin  (CRESTOR) tablet 10 mg (has no administration in time range)  sodium bicarbonate tablet 650 mg (has no administration in time range)  thiamine (VITAMIN B1) tablet 100 mg (has no administration in time range)  traMADol (ULTRAM) tablet 25 mg (has no administration in time range)  insulin aspart (novoLOG) injection 3 Units (has no administration in time range)    ED Course/ Medical Decision Making/ A&P                                 Medical Decision Making Amount and/or Complexity of Data Reviewed Labs: ordered.   Patient with multiple chronic illnesses unable to care for self and currently in an unsafe situation where she cannot get her her medications.  Medications that are of special concern are apixaban and levetiracetam.  I reviewed her laboratory tests, and my interpretation is worsening renal insufficiency, only mildly elevated glucose, mild elevation of transaminases of uncertain clinical significance, anemia with drop in hemoglobin of 1.5 g compared with 03/31/2023, but also with associated worsening of renal function.  I have requested transitions of care consult for placement, I have ordered all of her medications to be restarted.  Final Clinical Impression(s) / ED Diagnoses Final diagnoses:  Unable to care for self  Stage 4 chronic kidney disease (HCC)  Anemia associated with chronic renal failure  Elevated transaminase level    Rx / DC Orders ED Discharge Orders     None         Dione Booze, MD 09/17/23 [phone]

## 2023-09-18 DIAGNOSIS — R7401 Elevation of levels of liver transaminase levels: Secondary | ICD-10-CM | POA: Diagnosis not present

## 2023-09-18 LAB — CBG MONITORING, ED
Glucose-Capillary: 127 mg/dL — ABNORMAL HIGH (ref 70–99)
Glucose-Capillary: 142 mg/dL — ABNORMAL HIGH (ref 70–99)
Glucose-Capillary: 137 mg/dL — ABNORMAL HIGH (ref 70–99)
Glucose-Capillary: 137 mg/dL — ABNORMAL HIGH (ref 70–99)

## 2023-09-18 NOTE — ED Notes (Signed)
Pt sat up on the side of the bed to eat dinner.

## 2023-09-18 NOTE — ED Notes (Signed)
Pt requests to move up in bed. This nurse offered to assist pt up from bed with walker and assist to higher position in bed. Pt refused and stated" I want to be pulled up." This nurse again offered to assist pt to stand and move to higher position in bed. Pt stated "ya'll are going to pull me up". Again pt informed this nurse will assist with moving up in bed but with pt's help. Pt stated "I'll just stay on the light until ya'll pull me up." Staff will guide pt to higher position in bed when pt ambulates to bathroom again or willingly helps staff with repositioning.

## 2023-09-18 NOTE — ED Notes (Addendum)
Pt watching television and waiting on dinner. Denies needs at this time. Call light within reach, bed locked and in lowest position.

## 2023-09-18 NOTE — ED Notes (Signed)
Pt sitting on side of bed eating breakfast.

## 2023-09-18 NOTE — ED Provider Notes (Signed)
Emergency Medicine Observation Re-evaluation Note  [name] is a 57 y.o. female, seen on rounds today.  Pt initially presented to the ED for complaints of Requesting Placement  Currently, the patient is asleep.  Pt has a tentative acceptance to [facility].  A TB test was placed yesterday and will need to be read tomorrow.  Results will need to be sent to the facility.  PT did see her yesterday.  Physical Exam  BP (!) 142/88   Pulse 86   Temp 98 F (36.7 C)   Resp 20   Ht 5\' 1"  (1.549 m)   Wt 78.5 kg   LMP 05/19/2016   SpO2 97%   BMI 32.69 kg/m  Physical Exam General: asleep Cardiac: rr Lungs: clear Psych: asleep/calm  ED Course / MDM  EKG:   I have reviewed the labs performed to date as well as medications administered while in observation.  Recent changes in the last 24 hours include TB test placed.  Plan  Current plan is for ALF Monday?    Jacalyn Lefevre, MD 09/18/23 [phone]

## 2023-09-19 DIAGNOSIS — R7401 Elevation of levels of liver transaminase levels: Secondary | ICD-10-CM | POA: Diagnosis not present

## 2023-09-19 LAB — CBG MONITORING, ED
Glucose-Capillary: 177 mg/dL — ABNORMAL HIGH (ref 70–99)
Glucose-Capillary: 128 mg/dL — ABNORMAL HIGH (ref 70–99)

## 2023-09-19 NOTE — ED Notes (Signed)
Pt called out stating she needed to be repositioned in the bed and she would like a new bed her current one is uncomfortable. I explained to Pt she can use her walker to stand up and let us help her reposition that way but we cannot help her by pulling her up in the bed ourselves. Explained to pt she needs to continue to do the ADL's that she can without assistance so she can get stronger. Pt agreed and stood up with walker with no assistance and got herself repositioned. I offered her a recliner for comfort and to get her out of the bed and she refused.

## 2023-09-19 NOTE — ED Provider Notes (Signed)
Emergency Medicine Observation Re-evaluation Note  [name] is a 57 y.o. female, seen on rounds today.  Pt initially presented to the ED for complaints of Requesting Placement  Currently, the patient is resting in bed.  Physical Exam  BP (!) 152/95   Pulse 93   Temp 98.1 F (36.7 C) (Oral)   Resp 18   Ht 5\' 1"  (1.549 m)   Wt 78.5 kg   LMP 05/19/2016   SpO2 95%   BMI 32.69 kg/m  Physical Exam General: Awake, alert, nondistressed Cardiac: Normal heart rate Lungs: Breathing is unlabored Psych: No agitation  ED Course / MDM  EKG:   I have reviewed the labs performed to date as well as medications administered while in observation.  Recent changes in the last 24 hours include none.  Plan  Current plan is for facility placement.    Gloris Manchester, MD 09/19/23 [phone]

## 2023-09-19 NOTE — ED Notes (Signed)
Pt up to bathroom with walker pt assisted with pulling up brief otherwise pt able to toilet self, pt back to bed, vitals obtained, pt resting at this time.

## 2023-09-20 DIAGNOSIS — R7401 Elevation of levels of liver transaminase levels: Secondary | ICD-10-CM | POA: Diagnosis not present

## 2023-09-20 LAB — CBG MONITORING, ED
Glucose-Capillary: 128 mg/dL — ABNORMAL HIGH (ref 70–99)
Glucose-Capillary: 125 mg/dL — ABNORMAL HIGH (ref 70–99)

## 2023-09-20 MED ORDER — FUROSEMIDE 20 MG PO TABS: 20.0000 mg | ORAL_TABLET | Freq: Two times a day (BID) | ORAL | 0 refills | Status: DC

## 2023-09-20 MED ORDER — CALCITRIOL 0.25 MCG PO CAPS: 0.2500 ug | ORAL_CAPSULE | Freq: Every day | ORAL | 0 refills | Status: AC

## 2023-09-20 MED ORDER — APIXABAN 5 MG PO TABS: 5.0000 mg | ORAL_TABLET | Freq: Two times a day (BID) | ORAL | 0 refills | Status: AC

## 2023-09-20 MED ORDER — LEVETIRACETAM 500 MG PO TABS: 500.0000 mg | ORAL_TABLET | Freq: Two times a day (BID) | ORAL | 0 refills | Status: DC

## 2023-09-20 MED ORDER — ARIPIPRAZOLE 5 MG PO TABS: 5.0000 mg | ORAL_TABLET | Freq: Every day | ORAL | 0 refills | Status: AC

## 2023-09-20 MED ORDER — TUBERCULIN PPD 5 UNIT/0.1ML ID SOLN
5.0000 [IU] | INTRADERMAL | Status: DC
Start: 2023-09-20 — End: 2023-09-20
  Filled 2023-09-20: qty 0.1

## 2023-09-20 MED ORDER — ASPIRIN 81 MG PO CHEW: 81.0000 mg | CHEWABLE_TABLET | Freq: Every day | ORAL | 0 refills | Status: DC

## 2023-09-20 MED ORDER — FLUOXETINE HCL 40 MG PO CAPS: ORAL_CAPSULE | ORAL | 3 refills | Status: AC

## 2023-09-20 NOTE — ED Provider Notes (Signed)
Patient has been accepted at [facility].  Discussed with the patient and she is happy to be placed.  She is ready to get out of the emergency department and has no complaints at this time.   Lonell Grandchild, MD 09/20/23 [phone]

## 2023-09-20 NOTE — ED Notes (Signed)
Pt ambulatory to bathroom with a walker, changed bedding, new blankets given, pt has no requests at this time.

## 2023-09-20 NOTE — ED Notes (Signed)
CSW working on new ALF placement. Updated Trudy at [facility] on the home medications at pt's bedside. Spoke with pt's son who states that the glucometer/supplies and insulin are still at the house. Trudy at the ALF working on getting a staff member of hers to go pick them up as pt's son does not have transportation.  Per Damian Leavell, this will not be before 3pm. She states that they are trying to still be able to admit pt today but it many end up being tomorrow AM.  Faxed additional requested documents to [facility].

## 2023-09-20 NOTE — ED Notes (Signed)
Pt PPD on R arm is negative.  No swelling or induration noted

## 2023-09-20 NOTE — ED Notes (Signed)
Pt up to bathroom with assistance via walker

## 2023-09-20 NOTE — ED Provider Notes (Signed)
Emergency Medicine Observation Re-evaluation Note  [name] is a 57 y.o. female, seen on rounds today.  Pt initially presented to the ED for complaints of Requesting Placement  Currently, the patient is awaiting nursing home placement.  Physical Exam  BP (!) 143/94 (BP Location: Right Arm)   Pulse 84   Temp 98.4 F (36.9 C) (Oral)   Resp 18   Ht 5\' 1"  (1.549 m)   Wt 78.5 kg   LMP 05/19/2016   SpO2 100%   BMI 32.69 kg/m  Physical Exam Alert and in no acute distress  ED Course / MDM  EKG:   I have reviewed the labs performed to date as well as medications administered while in observation.  Recent changes in the last 24 hours include none.  Plan  Current plan is for nursing home placement.    Bethann Berkshire, MD 09/20/23 [phone]
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
## 2023-09-20 NOTE — ED Notes (Signed)
Received update from staff at [facility] that they have everything they need and they will be coming to get patient in about 30 minutes.   TOC updated MD and RN. No other Needs.

## 2023-09-24 ENCOUNTER — Other Ambulatory Visit: Payer: Self-pay | Admitting: Internal Medicine

## 2023-09-27 DIAGNOSIS — N184 Chronic kidney disease, stage 4 (severe): Secondary | ICD-10-CM | POA: Diagnosis not present

## 2023-09-27 DIAGNOSIS — S3219XS Other fracture of sacrum, sequela: Secondary | ICD-10-CM | POA: Diagnosis not present

## 2023-09-27 DIAGNOSIS — D1721 Benign lipomatous neoplasm of skin and subcutaneous tissue of right arm: Secondary | ICD-10-CM | POA: Diagnosis not present

## 2023-09-29 DIAGNOSIS — J9611 Chronic respiratory failure with hypoxia: Secondary | ICD-10-CM | POA: Diagnosis not present

## 2023-09-29 DIAGNOSIS — K219 Gastro-esophageal reflux disease without esophagitis: Secondary | ICD-10-CM | POA: Diagnosis not present

## 2023-09-29 DIAGNOSIS — E1122 Type 2 diabetes mellitus with diabetic chronic kidney disease: Secondary | ICD-10-CM | POA: Diagnosis not present

## 2023-09-29 DIAGNOSIS — I5042 Chronic combined systolic (congestive) and diastolic (congestive) heart failure: Secondary | ICD-10-CM | POA: Diagnosis not present

## 2023-09-29 DIAGNOSIS — M6281 Muscle weakness (generalized): Secondary | ICD-10-CM | POA: Diagnosis not present

## 2023-09-29 DIAGNOSIS — J449 Chronic obstructive pulmonary disease, unspecified: Secondary | ICD-10-CM | POA: Diagnosis not present

## 2023-09-29 DIAGNOSIS — G894 Chronic pain syndrome: Secondary | ICD-10-CM | POA: Diagnosis not present

## 2023-09-29 DIAGNOSIS — D631 Anemia in chronic kidney disease: Secondary | ICD-10-CM | POA: Diagnosis not present

## 2023-09-29 DIAGNOSIS — I480 Paroxysmal atrial fibrillation: Secondary | ICD-10-CM | POA: Diagnosis not present

## 2023-09-29 DIAGNOSIS — N184 Chronic kidney disease, stage 4 (severe): Secondary | ICD-10-CM | POA: Diagnosis not present

## 2023-09-29 DIAGNOSIS — R296 Repeated falls: Secondary | ICD-10-CM | POA: Diagnosis not present

## 2023-09-29 DIAGNOSIS — M48061 Spinal stenosis, lumbar region without neurogenic claudication: Secondary | ICD-10-CM | POA: Diagnosis not present

## 2023-09-29 DIAGNOSIS — I130 Hypertensive heart and chronic kidney disease with heart failure and stage 1 through stage 4 chronic kidney disease, or unspecified chronic kidney disease: Secondary | ICD-10-CM | POA: Diagnosis not present

## 2023-09-29 DIAGNOSIS — R2689 Other abnormalities of gait and mobility: Secondary | ICD-10-CM | POA: Diagnosis not present

## 2023-09-29 DIAGNOSIS — G4700 Insomnia, unspecified: Secondary | ICD-10-CM | POA: Diagnosis not present

## 2023-09-29 DIAGNOSIS — N390 Urinary tract infection, site not specified: Secondary | ICD-10-CM | POA: Diagnosis not present

## 2023-09-29 DIAGNOSIS — R269 Unspecified abnormalities of gait and mobility: Secondary | ICD-10-CM | POA: Diagnosis not present

## 2023-09-29 DIAGNOSIS — E46 Unspecified protein-calorie malnutrition: Secondary | ICD-10-CM | POA: Diagnosis not present

## 2023-09-29 DIAGNOSIS — E785 Hyperlipidemia, unspecified: Secondary | ICD-10-CM | POA: Diagnosis not present

## 2023-09-29 DIAGNOSIS — I700 Atherosclerosis of aorta: Secondary | ICD-10-CM | POA: Diagnosis not present

## 2023-09-29 DIAGNOSIS — Z9181 History of falling: Secondary | ICD-10-CM | POA: Diagnosis not present

## 2023-09-29 DIAGNOSIS — I70219 Atherosclerosis of native arteries of extremities with intermittent claudication, unspecified extremity: Secondary | ICD-10-CM | POA: Diagnosis not present

## 2023-09-29 DIAGNOSIS — E1151 Type 2 diabetes mellitus with diabetic peripheral angiopathy without gangrene: Secondary | ICD-10-CM | POA: Diagnosis not present

## 2023-10-01 DIAGNOSIS — I10 Essential (primary) hypertension: Secondary | ICD-10-CM | POA: Diagnosis not present

## 2023-10-01 DIAGNOSIS — E785 Hyperlipidemia, unspecified: Secondary | ICD-10-CM | POA: Diagnosis not present

## 2023-10-01 DIAGNOSIS — E1100 Type 2 diabetes mellitus with hyperosmolarity without nonketotic hyperglycemic-hyperosmolar coma (NKHHC): Secondary | ICD-10-CM | POA: Diagnosis not present

## 2023-10-01 DIAGNOSIS — N184 Chronic kidney disease, stage 4 (severe): Secondary | ICD-10-CM | POA: Diagnosis not present

## 2023-10-01 DIAGNOSIS — E46 Unspecified protein-calorie malnutrition: Secondary | ICD-10-CM | POA: Diagnosis not present

## 2023-10-05 DIAGNOSIS — I130 Hypertensive heart and chronic kidney disease with heart failure and stage 1 through stage 4 chronic kidney disease, or unspecified chronic kidney disease: Secondary | ICD-10-CM | POA: Diagnosis not present

## 2023-10-05 DIAGNOSIS — J449 Chronic obstructive pulmonary disease, unspecified: Secondary | ICD-10-CM | POA: Diagnosis not present

## 2023-10-05 DIAGNOSIS — E46 Unspecified protein-calorie malnutrition: Secondary | ICD-10-CM | POA: Diagnosis not present

## 2023-10-05 DIAGNOSIS — M48061 Spinal stenosis, lumbar region without neurogenic claudication: Secondary | ICD-10-CM | POA: Diagnosis not present

## 2023-10-05 DIAGNOSIS — I5042 Chronic combined systolic (congestive) and diastolic (congestive) heart failure: Secondary | ICD-10-CM | POA: Diagnosis not present

## 2023-10-05 DIAGNOSIS — I480 Paroxysmal atrial fibrillation: Secondary | ICD-10-CM | POA: Diagnosis not present

## 2023-10-05 DIAGNOSIS — E1151 Type 2 diabetes mellitus with diabetic peripheral angiopathy without gangrene: Secondary | ICD-10-CM | POA: Diagnosis not present

## 2023-10-05 DIAGNOSIS — R296 Repeated falls: Secondary | ICD-10-CM | POA: Diagnosis not present

## 2023-10-05 DIAGNOSIS — M6281 Muscle weakness (generalized): Secondary | ICD-10-CM | POA: Diagnosis not present

## 2023-10-05 DIAGNOSIS — K219 Gastro-esophageal reflux disease without esophagitis: Secondary | ICD-10-CM | POA: Diagnosis not present

## 2023-10-05 DIAGNOSIS — N390 Urinary tract infection, site not specified: Secondary | ICD-10-CM | POA: Diagnosis not present

## 2023-10-05 DIAGNOSIS — E785 Hyperlipidemia, unspecified: Secondary | ICD-10-CM | POA: Diagnosis not present

## 2023-10-05 DIAGNOSIS — I700 Atherosclerosis of aorta: Secondary | ICD-10-CM | POA: Diagnosis not present

## 2023-10-05 DIAGNOSIS — G4700 Insomnia, unspecified: Secondary | ICD-10-CM | POA: Diagnosis not present

## 2023-10-05 DIAGNOSIS — E1122 Type 2 diabetes mellitus with diabetic chronic kidney disease: Secondary | ICD-10-CM | POA: Diagnosis not present

## 2023-10-05 DIAGNOSIS — G894 Chronic pain syndrome: Secondary | ICD-10-CM | POA: Diagnosis not present

## 2023-10-05 DIAGNOSIS — D631 Anemia in chronic kidney disease: Secondary | ICD-10-CM | POA: Diagnosis not present

## 2023-10-05 DIAGNOSIS — I70219 Atherosclerosis of native arteries of extremities with intermittent claudication, unspecified extremity: Secondary | ICD-10-CM | POA: Diagnosis not present

## 2023-10-05 DIAGNOSIS — R269 Unspecified abnormalities of gait and mobility: Secondary | ICD-10-CM | POA: Diagnosis not present

## 2023-10-05 DIAGNOSIS — N184 Chronic kidney disease, stage 4 (severe): Secondary | ICD-10-CM | POA: Diagnosis not present

## 2023-10-05 DIAGNOSIS — J9611 Chronic respiratory failure with hypoxia: Secondary | ICD-10-CM | POA: Diagnosis not present

## 2023-10-06 DIAGNOSIS — I70219 Atherosclerosis of native arteries of extremities with intermittent claudication, unspecified extremity: Secondary | ICD-10-CM | POA: Diagnosis not present

## 2023-10-06 DIAGNOSIS — D631 Anemia in chronic kidney disease: Secondary | ICD-10-CM | POA: Diagnosis not present

## 2023-10-06 DIAGNOSIS — K219 Gastro-esophageal reflux disease without esophagitis: Secondary | ICD-10-CM | POA: Diagnosis not present

## 2023-10-06 DIAGNOSIS — N184 Chronic kidney disease, stage 4 (severe): Secondary | ICD-10-CM | POA: Diagnosis not present

## 2023-10-06 DIAGNOSIS — N390 Urinary tract infection, site not specified: Secondary | ICD-10-CM | POA: Diagnosis not present

## 2023-10-06 DIAGNOSIS — J449 Chronic obstructive pulmonary disease, unspecified: Secondary | ICD-10-CM | POA: Diagnosis not present

## 2023-10-06 DIAGNOSIS — G894 Chronic pain syndrome: Secondary | ICD-10-CM | POA: Diagnosis not present

## 2023-10-06 DIAGNOSIS — I700 Atherosclerosis of aorta: Secondary | ICD-10-CM | POA: Diagnosis not present

## 2023-10-06 DIAGNOSIS — R296 Repeated falls: Secondary | ICD-10-CM | POA: Diagnosis not present

## 2023-10-06 DIAGNOSIS — E1151 Type 2 diabetes mellitus with diabetic peripheral angiopathy without gangrene: Secondary | ICD-10-CM | POA: Diagnosis not present

## 2023-10-06 DIAGNOSIS — E785 Hyperlipidemia, unspecified: Secondary | ICD-10-CM | POA: Diagnosis not present

## 2023-10-06 DIAGNOSIS — M6281 Muscle weakness (generalized): Secondary | ICD-10-CM | POA: Diagnosis not present

## 2023-10-06 DIAGNOSIS — I130 Hypertensive heart and chronic kidney disease with heart failure and stage 1 through stage 4 chronic kidney disease, or unspecified chronic kidney disease: Secondary | ICD-10-CM | POA: Diagnosis not present

## 2023-10-06 DIAGNOSIS — G4700 Insomnia, unspecified: Secondary | ICD-10-CM | POA: Diagnosis not present

## 2023-10-06 DIAGNOSIS — I480 Paroxysmal atrial fibrillation: Secondary | ICD-10-CM | POA: Diagnosis not present

## 2023-10-06 DIAGNOSIS — J9611 Chronic respiratory failure with hypoxia: Secondary | ICD-10-CM | POA: Diagnosis not present

## 2023-10-06 DIAGNOSIS — E1122 Type 2 diabetes mellitus with diabetic chronic kidney disease: Secondary | ICD-10-CM | POA: Diagnosis not present

## 2023-10-06 DIAGNOSIS — I5042 Chronic combined systolic (congestive) and diastolic (congestive) heart failure: Secondary | ICD-10-CM | POA: Diagnosis not present

## 2023-10-06 DIAGNOSIS — R269 Unspecified abnormalities of gait and mobility: Secondary | ICD-10-CM | POA: Diagnosis not present

## 2023-10-06 DIAGNOSIS — E46 Unspecified protein-calorie malnutrition: Secondary | ICD-10-CM | POA: Diagnosis not present

## 2023-10-06 DIAGNOSIS — M48061 Spinal stenosis, lumbar region without neurogenic claudication: Secondary | ICD-10-CM | POA: Diagnosis not present

## 2023-10-10 DIAGNOSIS — M79641 Pain in right hand: Secondary | ICD-10-CM | POA: Diagnosis not present

## 2023-10-11 DIAGNOSIS — E211 Secondary hyperparathyroidism, not elsewhere classified: Secondary | ICD-10-CM | POA: Diagnosis not present

## 2023-10-11 DIAGNOSIS — N189 Chronic kidney disease, unspecified: Secondary | ICD-10-CM | POA: Diagnosis not present

## 2023-10-11 DIAGNOSIS — D649 Anemia, unspecified: Secondary | ICD-10-CM | POA: Diagnosis not present

## 2023-10-11 DIAGNOSIS — J449 Chronic obstructive pulmonary disease, unspecified: Secondary | ICD-10-CM | POA: Diagnosis not present

## 2023-10-11 DIAGNOSIS — R809 Proteinuria, unspecified: Secondary | ICD-10-CM | POA: Diagnosis not present

## 2023-10-11 DIAGNOSIS — I10 Essential (primary) hypertension: Secondary | ICD-10-CM | POA: Diagnosis not present

## 2023-10-11 DIAGNOSIS — M19141 Post-traumatic osteoarthritis, right hand: Secondary | ICD-10-CM | POA: Diagnosis not present

## 2023-10-11 DIAGNOSIS — R531 Weakness: Secondary | ICD-10-CM | POA: Diagnosis not present

## 2023-10-11 DIAGNOSIS — D631 Anemia in chronic kidney disease: Secondary | ICD-10-CM | POA: Diagnosis not present

## 2023-10-12 DIAGNOSIS — I130 Hypertensive heart and chronic kidney disease with heart failure and stage 1 through stage 4 chronic kidney disease, or unspecified chronic kidney disease: Secondary | ICD-10-CM | POA: Diagnosis not present

## 2023-10-12 DIAGNOSIS — G4700 Insomnia, unspecified: Secondary | ICD-10-CM | POA: Diagnosis not present

## 2023-10-12 DIAGNOSIS — I480 Paroxysmal atrial fibrillation: Secondary | ICD-10-CM | POA: Diagnosis not present

## 2023-10-12 DIAGNOSIS — E46 Unspecified protein-calorie malnutrition: Secondary | ICD-10-CM | POA: Diagnosis not present

## 2023-10-12 DIAGNOSIS — I5042 Chronic combined systolic (congestive) and diastolic (congestive) heart failure: Secondary | ICD-10-CM | POA: Diagnosis not present

## 2023-10-12 DIAGNOSIS — I70219 Atherosclerosis of native arteries of extremities with intermittent claudication, unspecified extremity: Secondary | ICD-10-CM | POA: Diagnosis not present

## 2023-10-12 DIAGNOSIS — E1122 Type 2 diabetes mellitus with diabetic chronic kidney disease: Secondary | ICD-10-CM | POA: Diagnosis not present

## 2023-10-12 DIAGNOSIS — J449 Chronic obstructive pulmonary disease, unspecified: Secondary | ICD-10-CM | POA: Diagnosis not present

## 2023-10-12 DIAGNOSIS — N184 Chronic kidney disease, stage 4 (severe): Secondary | ICD-10-CM | POA: Diagnosis not present

## 2023-10-12 DIAGNOSIS — E785 Hyperlipidemia, unspecified: Secondary | ICD-10-CM | POA: Diagnosis not present

## 2023-10-12 DIAGNOSIS — M6281 Muscle weakness (generalized): Secondary | ICD-10-CM | POA: Diagnosis not present

## 2023-10-12 DIAGNOSIS — D631 Anemia in chronic kidney disease: Secondary | ICD-10-CM | POA: Diagnosis not present

## 2023-10-12 DIAGNOSIS — I700 Atherosclerosis of aorta: Secondary | ICD-10-CM | POA: Diagnosis not present

## 2023-10-12 DIAGNOSIS — R296 Repeated falls: Secondary | ICD-10-CM | POA: Diagnosis not present

## 2023-10-12 DIAGNOSIS — G894 Chronic pain syndrome: Secondary | ICD-10-CM | POA: Diagnosis not present

## 2023-10-12 DIAGNOSIS — N390 Urinary tract infection, site not specified: Secondary | ICD-10-CM | POA: Diagnosis not present

## 2023-10-12 DIAGNOSIS — R269 Unspecified abnormalities of gait and mobility: Secondary | ICD-10-CM | POA: Diagnosis not present

## 2023-10-12 DIAGNOSIS — M48061 Spinal stenosis, lumbar region without neurogenic claudication: Secondary | ICD-10-CM | POA: Diagnosis not present

## 2023-10-12 DIAGNOSIS — J9611 Chronic respiratory failure with hypoxia: Secondary | ICD-10-CM | POA: Diagnosis not present

## 2023-10-12 DIAGNOSIS — E1151 Type 2 diabetes mellitus with diabetic peripheral angiopathy without gangrene: Secondary | ICD-10-CM | POA: Diagnosis not present

## 2023-10-12 DIAGNOSIS — K219 Gastro-esophageal reflux disease without esophagitis: Secondary | ICD-10-CM | POA: Diagnosis not present

## 2023-10-13 DIAGNOSIS — I5042 Chronic combined systolic (congestive) and diastolic (congestive) heart failure: Secondary | ICD-10-CM | POA: Diagnosis not present

## 2023-10-13 DIAGNOSIS — K219 Gastro-esophageal reflux disease without esophagitis: Secondary | ICD-10-CM | POA: Diagnosis not present

## 2023-10-13 DIAGNOSIS — D631 Anemia in chronic kidney disease: Secondary | ICD-10-CM | POA: Diagnosis not present

## 2023-10-13 DIAGNOSIS — R296 Repeated falls: Secondary | ICD-10-CM | POA: Diagnosis not present

## 2023-10-13 DIAGNOSIS — E785 Hyperlipidemia, unspecified: Secondary | ICD-10-CM | POA: Diagnosis not present

## 2023-10-13 DIAGNOSIS — E1151 Type 2 diabetes mellitus with diabetic peripheral angiopathy without gangrene: Secondary | ICD-10-CM | POA: Diagnosis not present

## 2023-10-13 DIAGNOSIS — G894 Chronic pain syndrome: Secondary | ICD-10-CM | POA: Diagnosis not present

## 2023-10-13 DIAGNOSIS — J449 Chronic obstructive pulmonary disease, unspecified: Secondary | ICD-10-CM | POA: Diagnosis not present

## 2023-10-13 DIAGNOSIS — R269 Unspecified abnormalities of gait and mobility: Secondary | ICD-10-CM | POA: Diagnosis not present

## 2023-10-13 DIAGNOSIS — G4700 Insomnia, unspecified: Secondary | ICD-10-CM | POA: Diagnosis not present

## 2023-10-13 DIAGNOSIS — I700 Atherosclerosis of aorta: Secondary | ICD-10-CM | POA: Diagnosis not present

## 2023-10-13 DIAGNOSIS — E46 Unspecified protein-calorie malnutrition: Secondary | ICD-10-CM | POA: Diagnosis not present

## 2023-10-13 DIAGNOSIS — J9611 Chronic respiratory failure with hypoxia: Secondary | ICD-10-CM | POA: Diagnosis not present

## 2023-10-13 DIAGNOSIS — E1122 Type 2 diabetes mellitus with diabetic chronic kidney disease: Secondary | ICD-10-CM | POA: Diagnosis not present

## 2023-10-13 DIAGNOSIS — M48061 Spinal stenosis, lumbar region without neurogenic claudication: Secondary | ICD-10-CM | POA: Diagnosis not present

## 2023-10-13 DIAGNOSIS — N184 Chronic kidney disease, stage 4 (severe): Secondary | ICD-10-CM | POA: Diagnosis not present

## 2023-10-13 DIAGNOSIS — I480 Paroxysmal atrial fibrillation: Secondary | ICD-10-CM | POA: Diagnosis not present

## 2023-10-13 DIAGNOSIS — N390 Urinary tract infection, site not specified: Secondary | ICD-10-CM | POA: Diagnosis not present

## 2023-10-13 DIAGNOSIS — M6281 Muscle weakness (generalized): Secondary | ICD-10-CM | POA: Diagnosis not present

## 2023-10-13 DIAGNOSIS — I70219 Atherosclerosis of native arteries of extremities with intermittent claudication, unspecified extremity: Secondary | ICD-10-CM | POA: Diagnosis not present

## 2023-10-13 DIAGNOSIS — I130 Hypertensive heart and chronic kidney disease with heart failure and stage 1 through stage 4 chronic kidney disease, or unspecified chronic kidney disease: Secondary | ICD-10-CM | POA: Diagnosis not present

## 2023-10-15 NOTE — ED Notes (Signed)
CSW spoke to [place] Neighbors with [facility] on 10/15/2023 who states that she never received documentation of negative TB skin test. CSW faxed to number provided by [place].

## 2023-10-18 DIAGNOSIS — G894 Chronic pain syndrome: Secondary | ICD-10-CM | POA: Diagnosis not present

## 2023-10-18 DIAGNOSIS — R296 Repeated falls: Secondary | ICD-10-CM | POA: Diagnosis not present

## 2023-10-18 DIAGNOSIS — I70219 Atherosclerosis of native arteries of extremities with intermittent claudication, unspecified extremity: Secondary | ICD-10-CM | POA: Diagnosis not present

## 2023-10-18 DIAGNOSIS — E1151 Type 2 diabetes mellitus with diabetic peripheral angiopathy without gangrene: Secondary | ICD-10-CM | POA: Diagnosis not present

## 2023-10-18 DIAGNOSIS — R269 Unspecified abnormalities of gait and mobility: Secondary | ICD-10-CM | POA: Diagnosis not present

## 2023-10-18 DIAGNOSIS — J9611 Chronic respiratory failure with hypoxia: Secondary | ICD-10-CM | POA: Diagnosis not present

## 2023-10-18 DIAGNOSIS — M48061 Spinal stenosis, lumbar region without neurogenic claudication: Secondary | ICD-10-CM | POA: Diagnosis not present

## 2023-10-18 DIAGNOSIS — E785 Hyperlipidemia, unspecified: Secondary | ICD-10-CM | POA: Diagnosis not present

## 2023-10-18 DIAGNOSIS — I130 Hypertensive heart and chronic kidney disease with heart failure and stage 1 through stage 4 chronic kidney disease, or unspecified chronic kidney disease: Secondary | ICD-10-CM | POA: Diagnosis not present

## 2023-10-18 DIAGNOSIS — N390 Urinary tract infection, site not specified: Secondary | ICD-10-CM | POA: Diagnosis not present

## 2023-10-18 DIAGNOSIS — G4700 Insomnia, unspecified: Secondary | ICD-10-CM | POA: Diagnosis not present

## 2023-10-18 DIAGNOSIS — K219 Gastro-esophageal reflux disease without esophagitis: Secondary | ICD-10-CM | POA: Diagnosis not present

## 2023-10-18 DIAGNOSIS — I480 Paroxysmal atrial fibrillation: Secondary | ICD-10-CM | POA: Diagnosis not present

## 2023-10-18 DIAGNOSIS — I700 Atherosclerosis of aorta: Secondary | ICD-10-CM | POA: Diagnosis not present

## 2023-10-18 DIAGNOSIS — E46 Unspecified protein-calorie malnutrition: Secondary | ICD-10-CM | POA: Diagnosis not present

## 2023-10-18 DIAGNOSIS — I5042 Chronic combined systolic (congestive) and diastolic (congestive) heart failure: Secondary | ICD-10-CM | POA: Diagnosis not present

## 2023-10-18 DIAGNOSIS — E1122 Type 2 diabetes mellitus with diabetic chronic kidney disease: Secondary | ICD-10-CM | POA: Diagnosis not present

## 2023-10-18 DIAGNOSIS — N184 Chronic kidney disease, stage 4 (severe): Secondary | ICD-10-CM | POA: Diagnosis not present

## 2023-10-18 DIAGNOSIS — D631 Anemia in chronic kidney disease: Secondary | ICD-10-CM | POA: Diagnosis not present

## 2023-10-18 DIAGNOSIS — J449 Chronic obstructive pulmonary disease, unspecified: Secondary | ICD-10-CM | POA: Diagnosis not present

## 2023-10-18 DIAGNOSIS — M6281 Muscle weakness (generalized): Secondary | ICD-10-CM | POA: Diagnosis not present

## 2023-10-21 DIAGNOSIS — K219 Gastro-esophageal reflux disease without esophagitis: Secondary | ICD-10-CM | POA: Diagnosis not present

## 2023-10-21 DIAGNOSIS — J449 Chronic obstructive pulmonary disease, unspecified: Secondary | ICD-10-CM | POA: Diagnosis not present

## 2023-10-21 DIAGNOSIS — I480 Paroxysmal atrial fibrillation: Secondary | ICD-10-CM | POA: Diagnosis not present

## 2023-10-21 DIAGNOSIS — G894 Chronic pain syndrome: Secondary | ICD-10-CM | POA: Diagnosis not present

## 2023-10-21 DIAGNOSIS — E1151 Type 2 diabetes mellitus with diabetic peripheral angiopathy without gangrene: Secondary | ICD-10-CM | POA: Diagnosis not present

## 2023-10-21 DIAGNOSIS — E46 Unspecified protein-calorie malnutrition: Secondary | ICD-10-CM | POA: Diagnosis not present

## 2023-10-21 DIAGNOSIS — E559 Vitamin D deficiency, unspecified: Secondary | ICD-10-CM | POA: Diagnosis not present

## 2023-10-21 DIAGNOSIS — I129 Hypertensive chronic kidney disease with stage 1 through stage 4 chronic kidney disease, or unspecified chronic kidney disease: Secondary | ICD-10-CM | POA: Diagnosis not present

## 2023-10-21 DIAGNOSIS — I5042 Chronic combined systolic (congestive) and diastolic (congestive) heart failure: Secondary | ICD-10-CM | POA: Diagnosis not present

## 2023-10-21 DIAGNOSIS — R269 Unspecified abnormalities of gait and mobility: Secondary | ICD-10-CM | POA: Diagnosis not present

## 2023-10-21 DIAGNOSIS — N390 Urinary tract infection, site not specified: Secondary | ICD-10-CM | POA: Diagnosis not present

## 2023-10-21 DIAGNOSIS — M6281 Muscle weakness (generalized): Secondary | ICD-10-CM | POA: Diagnosis not present

## 2023-10-21 DIAGNOSIS — G4700 Insomnia, unspecified: Secondary | ICD-10-CM | POA: Diagnosis not present

## 2023-10-21 DIAGNOSIS — I70219 Atherosclerosis of native arteries of extremities with intermittent claudication, unspecified extremity: Secondary | ICD-10-CM | POA: Diagnosis not present

## 2023-10-21 DIAGNOSIS — N184 Chronic kidney disease, stage 4 (severe): Secondary | ICD-10-CM | POA: Diagnosis not present

## 2023-10-21 DIAGNOSIS — D638 Anemia in other chronic diseases classified elsewhere: Secondary | ICD-10-CM | POA: Diagnosis not present

## 2023-10-21 DIAGNOSIS — M48061 Spinal stenosis, lumbar region without neurogenic claudication: Secondary | ICD-10-CM | POA: Diagnosis not present

## 2023-10-21 DIAGNOSIS — I700 Atherosclerosis of aorta: Secondary | ICD-10-CM | POA: Diagnosis not present

## 2023-10-21 DIAGNOSIS — E1122 Type 2 diabetes mellitus with diabetic chronic kidney disease: Secondary | ICD-10-CM | POA: Diagnosis not present

## 2023-10-21 DIAGNOSIS — R296 Repeated falls: Secondary | ICD-10-CM | POA: Diagnosis not present

## 2023-10-21 DIAGNOSIS — I130 Hypertensive heart and chronic kidney disease with heart failure and stage 1 through stage 4 chronic kidney disease, or unspecified chronic kidney disease: Secondary | ICD-10-CM | POA: Diagnosis not present

## 2023-10-21 DIAGNOSIS — D631 Anemia in chronic kidney disease: Secondary | ICD-10-CM | POA: Diagnosis not present

## 2023-10-21 DIAGNOSIS — J9611 Chronic respiratory failure with hypoxia: Secondary | ICD-10-CM | POA: Diagnosis not present

## 2023-10-21 DIAGNOSIS — E785 Hyperlipidemia, unspecified: Secondary | ICD-10-CM | POA: Diagnosis not present

## 2023-10-28 DIAGNOSIS — I5042 Chronic combined systolic (congestive) and diastolic (congestive) heart failure: Secondary | ICD-10-CM | POA: Diagnosis not present

## 2023-10-28 DIAGNOSIS — J9611 Chronic respiratory failure with hypoxia: Secondary | ICD-10-CM | POA: Diagnosis not present

## 2023-10-28 DIAGNOSIS — D631 Anemia in chronic kidney disease: Secondary | ICD-10-CM | POA: Diagnosis not present

## 2023-10-28 DIAGNOSIS — I480 Paroxysmal atrial fibrillation: Secondary | ICD-10-CM | POA: Diagnosis not present

## 2023-10-28 DIAGNOSIS — N390 Urinary tract infection, site not specified: Secondary | ICD-10-CM | POA: Diagnosis not present

## 2023-10-28 DIAGNOSIS — I700 Atherosclerosis of aorta: Secondary | ICD-10-CM | POA: Diagnosis not present

## 2023-10-28 DIAGNOSIS — G894 Chronic pain syndrome: Secondary | ICD-10-CM | POA: Diagnosis not present

## 2023-10-28 DIAGNOSIS — E1122 Type 2 diabetes mellitus with diabetic chronic kidney disease: Secondary | ICD-10-CM | POA: Diagnosis not present

## 2023-10-28 DIAGNOSIS — J449 Chronic obstructive pulmonary disease, unspecified: Secondary | ICD-10-CM | POA: Diagnosis not present

## 2023-10-28 DIAGNOSIS — E1151 Type 2 diabetes mellitus with diabetic peripheral angiopathy without gangrene: Secondary | ICD-10-CM | POA: Diagnosis not present

## 2023-10-28 DIAGNOSIS — M6281 Muscle weakness (generalized): Secondary | ICD-10-CM | POA: Diagnosis not present

## 2023-10-28 DIAGNOSIS — G4700 Insomnia, unspecified: Secondary | ICD-10-CM | POA: Diagnosis not present

## 2023-10-28 DIAGNOSIS — I130 Hypertensive heart and chronic kidney disease with heart failure and stage 1 through stage 4 chronic kidney disease, or unspecified chronic kidney disease: Secondary | ICD-10-CM | POA: Diagnosis not present

## 2023-10-28 DIAGNOSIS — R269 Unspecified abnormalities of gait and mobility: Secondary | ICD-10-CM | POA: Diagnosis not present

## 2023-10-28 DIAGNOSIS — E46 Unspecified protein-calorie malnutrition: Secondary | ICD-10-CM | POA: Diagnosis not present

## 2023-10-28 DIAGNOSIS — K219 Gastro-esophageal reflux disease without esophagitis: Secondary | ICD-10-CM | POA: Diagnosis not present

## 2023-10-28 DIAGNOSIS — I70219 Atherosclerosis of native arteries of extremities with intermittent claudication, unspecified extremity: Secondary | ICD-10-CM | POA: Diagnosis not present

## 2023-10-28 DIAGNOSIS — R296 Repeated falls: Secondary | ICD-10-CM | POA: Diagnosis not present

## 2023-10-28 DIAGNOSIS — E785 Hyperlipidemia, unspecified: Secondary | ICD-10-CM | POA: Diagnosis not present

## 2023-10-28 DIAGNOSIS — N184 Chronic kidney disease, stage 4 (severe): Secondary | ICD-10-CM | POA: Diagnosis not present

## 2023-10-28 DIAGNOSIS — M48061 Spinal stenosis, lumbar region without neurogenic claudication: Secondary | ICD-10-CM | POA: Diagnosis not present

## 2023-11-01 DIAGNOSIS — R269 Unspecified abnormalities of gait and mobility: Secondary | ICD-10-CM | POA: Diagnosis not present

## 2023-11-01 DIAGNOSIS — M48061 Spinal stenosis, lumbar region without neurogenic claudication: Secondary | ICD-10-CM | POA: Diagnosis not present

## 2023-11-01 DIAGNOSIS — I700 Atherosclerosis of aorta: Secondary | ICD-10-CM | POA: Diagnosis not present

## 2023-11-01 DIAGNOSIS — I130 Hypertensive heart and chronic kidney disease with heart failure and stage 1 through stage 4 chronic kidney disease, or unspecified chronic kidney disease: Secondary | ICD-10-CM | POA: Diagnosis not present

## 2023-11-01 DIAGNOSIS — I70219 Atherosclerosis of native arteries of extremities with intermittent claudication, unspecified extremity: Secondary | ICD-10-CM | POA: Diagnosis not present

## 2023-11-01 DIAGNOSIS — D631 Anemia in chronic kidney disease: Secondary | ICD-10-CM | POA: Diagnosis not present

## 2023-11-01 DIAGNOSIS — R296 Repeated falls: Secondary | ICD-10-CM | POA: Diagnosis not present

## 2023-11-01 DIAGNOSIS — E785 Hyperlipidemia, unspecified: Secondary | ICD-10-CM | POA: Diagnosis not present

## 2023-11-01 DIAGNOSIS — I480 Paroxysmal atrial fibrillation: Secondary | ICD-10-CM | POA: Diagnosis not present

## 2023-11-01 DIAGNOSIS — E46 Unspecified protein-calorie malnutrition: Secondary | ICD-10-CM | POA: Diagnosis not present

## 2023-11-01 DIAGNOSIS — N390 Urinary tract infection, site not specified: Secondary | ICD-10-CM | POA: Diagnosis not present

## 2023-11-01 DIAGNOSIS — E1122 Type 2 diabetes mellitus with diabetic chronic kidney disease: Secondary | ICD-10-CM | POA: Diagnosis not present

## 2023-11-01 DIAGNOSIS — J449 Chronic obstructive pulmonary disease, unspecified: Secondary | ICD-10-CM | POA: Diagnosis not present

## 2023-11-01 DIAGNOSIS — E1151 Type 2 diabetes mellitus with diabetic peripheral angiopathy without gangrene: Secondary | ICD-10-CM | POA: Diagnosis not present

## 2023-11-01 DIAGNOSIS — I5042 Chronic combined systolic (congestive) and diastolic (congestive) heart failure: Secondary | ICD-10-CM | POA: Diagnosis not present

## 2023-11-01 DIAGNOSIS — J9611 Chronic respiratory failure with hypoxia: Secondary | ICD-10-CM | POA: Diagnosis not present

## 2023-11-01 DIAGNOSIS — K219 Gastro-esophageal reflux disease without esophagitis: Secondary | ICD-10-CM | POA: Diagnosis not present

## 2023-11-01 DIAGNOSIS — M6281 Muscle weakness (generalized): Secondary | ICD-10-CM | POA: Diagnosis not present

## 2023-11-01 DIAGNOSIS — G894 Chronic pain syndrome: Secondary | ICD-10-CM | POA: Diagnosis not present

## 2023-11-01 DIAGNOSIS — G4700 Insomnia, unspecified: Secondary | ICD-10-CM | POA: Diagnosis not present

## 2023-11-01 DIAGNOSIS — N184 Chronic kidney disease, stage 4 (severe): Secondary | ICD-10-CM | POA: Diagnosis not present

## 2023-11-04 DIAGNOSIS — R110 Nausea: Secondary | ICD-10-CM | POA: Diagnosis not present

## 2023-11-04 DIAGNOSIS — R1110 Vomiting, unspecified: Secondary | ICD-10-CM | POA: Diagnosis not present

## 2023-11-04 DIAGNOSIS — R197 Diarrhea, unspecified: Secondary | ICD-10-CM | POA: Diagnosis not present

## 2023-11-08 DIAGNOSIS — R197 Diarrhea, unspecified: Secondary | ICD-10-CM | POA: Diagnosis not present

## 2023-11-08 DIAGNOSIS — R5383 Other fatigue: Secondary | ICD-10-CM | POA: Diagnosis not present

## 2023-11-08 DIAGNOSIS — R110 Nausea: Secondary | ICD-10-CM | POA: Diagnosis not present

## 2023-11-08 DIAGNOSIS — R1110 Vomiting, unspecified: Secondary | ICD-10-CM | POA: Diagnosis not present

## 2023-11-10 DIAGNOSIS — K219 Gastro-esophageal reflux disease without esophagitis: Secondary | ICD-10-CM | POA: Diagnosis not present

## 2023-11-10 DIAGNOSIS — E1151 Type 2 diabetes mellitus with diabetic peripheral angiopathy without gangrene: Secondary | ICD-10-CM | POA: Diagnosis not present

## 2023-11-10 DIAGNOSIS — E46 Unspecified protein-calorie malnutrition: Secondary | ICD-10-CM | POA: Diagnosis not present

## 2023-11-10 DIAGNOSIS — G4700 Insomnia, unspecified: Secondary | ICD-10-CM | POA: Diagnosis not present

## 2023-11-10 DIAGNOSIS — I700 Atherosclerosis of aorta: Secondary | ICD-10-CM | POA: Diagnosis not present

## 2023-11-10 DIAGNOSIS — D631 Anemia in chronic kidney disease: Secondary | ICD-10-CM | POA: Diagnosis not present

## 2023-11-10 DIAGNOSIS — J449 Chronic obstructive pulmonary disease, unspecified: Secondary | ICD-10-CM | POA: Diagnosis not present

## 2023-11-10 DIAGNOSIS — I5042 Chronic combined systolic (congestive) and diastolic (congestive) heart failure: Secondary | ICD-10-CM | POA: Diagnosis not present

## 2023-11-10 DIAGNOSIS — J9611 Chronic respiratory failure with hypoxia: Secondary | ICD-10-CM | POA: Diagnosis not present

## 2023-11-10 DIAGNOSIS — I130 Hypertensive heart and chronic kidney disease with heart failure and stage 1 through stage 4 chronic kidney disease, or unspecified chronic kidney disease: Secondary | ICD-10-CM | POA: Diagnosis not present

## 2023-11-10 DIAGNOSIS — R296 Repeated falls: Secondary | ICD-10-CM | POA: Diagnosis not present

## 2023-11-10 DIAGNOSIS — N184 Chronic kidney disease, stage 4 (severe): Secondary | ICD-10-CM | POA: Diagnosis not present

## 2023-11-10 DIAGNOSIS — E785 Hyperlipidemia, unspecified: Secondary | ICD-10-CM | POA: Diagnosis not present

## 2023-11-10 DIAGNOSIS — G894 Chronic pain syndrome: Secondary | ICD-10-CM | POA: Diagnosis not present

## 2023-11-10 DIAGNOSIS — I70219 Atherosclerosis of native arteries of extremities with intermittent claudication, unspecified extremity: Secondary | ICD-10-CM | POA: Diagnosis not present

## 2023-11-10 DIAGNOSIS — N390 Urinary tract infection, site not specified: Secondary | ICD-10-CM | POA: Diagnosis not present

## 2023-11-10 DIAGNOSIS — E1122 Type 2 diabetes mellitus with diabetic chronic kidney disease: Secondary | ICD-10-CM | POA: Diagnosis not present

## 2023-11-10 DIAGNOSIS — R269 Unspecified abnormalities of gait and mobility: Secondary | ICD-10-CM | POA: Diagnosis not present

## 2023-11-10 DIAGNOSIS — I480 Paroxysmal atrial fibrillation: Secondary | ICD-10-CM | POA: Diagnosis not present

## 2023-11-10 DIAGNOSIS — M48061 Spinal stenosis, lumbar region without neurogenic claudication: Secondary | ICD-10-CM | POA: Diagnosis not present

## 2023-11-10 DIAGNOSIS — M6281 Muscle weakness (generalized): Secondary | ICD-10-CM | POA: Diagnosis not present

## 2023-11-10 NOTE — Progress Notes (Deleted)
 Patient: [name] Date of Birth: 27-Jan-1966  Reason for Visit: Follow up History from: Patient Primary Neurologist: Willis/Camara   ASSESSMENT AND PLAN 58 y.o. year old female   1.  Symptomatic seizures in setting of diabetic ketoacidosis   2.  History of cocaine abuse and tobacco abuse   3.  Cerebrovascular disease, recent right brain stroke, left hemiparesis  -Continue Keppra 500 mg twice daily -She wishes to remain on Keppra, we have discussed weaning off since seizure event occurred during significant DKA event -Ensure A1c is being monitored, good control of vascular risk factors, with history of stroke ensure BP less than 130/90, LDL less than 70, A1c less than 7.0 -Follow-up in 1 year or sooner if needed  HISTORY OF PRESENT ILLNESS: Today 11/10/23   11/05/22 [name]: Here today alone. Living at [facility] nursing home. Trying to get in to an assisted living facility. Remains on Keppra, has decided to remain on it. No seizures. Not sure about A1C, gets insulin. Has palliative care consult every 6-8 weeks. In a wheelchair, does very little walking, only during therapy. Still feels left side is weak. Claims she just got her ability to manage her affairs back.   HISTORY  11/04/21 [name]: [name] is a 58 year old female with a history of a seizure event in October 2021 after being diagnosed with DKA.  Since then the patient has not had any seizure events.  At the last visit an EEG was ordered but never completed.  The patient states that she does not wish to come off of Keppra.  She does not want to repeat an EEG.  The patient is currently in a skilled nursing facility.  She denies any seizure events.  She returns today for follow-up with her sister   10/01/2020 [name]: [name] is a 58 year old right-handed black female with a history of diabetes, tobacco abuse, cocaine abuse, chronic renal insufficiency, atrial fibrillation, congestive heart failure, and recent onset of  seizure.  The patient was admitted to the hospital on 08/13/2020, the patient claims that she was found at home having a seizure.  The patient was found to have diabetic ketoacidosis with a blood sugar of 772 on admission.  The patient admits to eating pure starch on a regular basis.  She was placed on Keppra in the hospital, at least 2 EEG studies were done, both showing generalized slowing consistent with a metabolic encephalopathy.  The patient underwent lumbar puncture showing 10 white cells with a normal glucose level and a slightly elevated protein level, no evidence of meningitis or herpes encephalitis was identified.  The patient has been noted to have a small subcortical stroke in the right basal ganglia area, she has had some left-sided weakness.  Since the hospitalization she has not been able to walk with some left-sided weakness.  The patient can take a few steps with a walker.  She wants to go home but she does not have any caretakers at home and she would require 24/7 assistance.  The patient initially had significant dysphagia but this is now improving.  The patient claims that her blood sugar remains in the 400 range but then admits to eating snacks that she gets from her roommate in the evening.  The patient does have urinary frequency and some urinary incontinence.  She has occasional headaches, she denies any vision changes.  She has not had any further seizures since coming out of the hospital.  She comes to this office  for an evaluation.   REVIEW OF SYSTEMS: Out of a complete 14 system review of symptoms, the patient complains only of the following symptoms, and all other reviewed systems are negative.  Barbara HPI  ALLERGIES: Allergies  Allergen Reactions   Sodium Hypochlorite Anaphylaxis    (Bleach wipes) "I am not able to breathe"    HOME MEDICATIONS: Outpatient Medications Prior to Visit  Medication Sig Dispense Refill   ACCU-CHEK GUIDE TEST test strip USE TO CHECK BLOOD SUGAR  WITH MEALS AND AT BEDTIME. 100 strip 0   acetaminophen (TYLENOL) 325 MG tablet Take 2 tablets (650 mg total) by mouth every 6 (six) hours as needed for headache, fever or mild pain. (Patient taking differently: Take 650 mg by mouth every 6 (six) hours as needed for fever (or pain).)     ammonium lactate (AMLACTIN) 12 % lotion Apply 1 Application topically as needed for dry skin. 400 g 4   apixaban (ELIQUIS) 5 MG TABS tablet Take 1 tablet (5 mg total) by mouth 2 (two) times daily. 60 tablet 0   ARIPiprazole (ABILIFY) 5 MG tablet Take 1 tablet (5 mg total) by mouth daily. 60 tablet 0   ascorbic acid (VITAMIN C) 500 MG tablet TAKE (1) TABLET BY MOUTH TWICE DAILY. (Patient taking differently: Take 500 mg by mouth daily.) 60 tablet 0   aspirin 81 MG chewable tablet Chew 1 tablet (81 mg total) by mouth daily. 30 tablet 0   calcitRIOL (ROCALTROL) 0.25 MCG capsule Take 1 capsule (0.25 mcg total) by mouth daily. 30 capsule 0   FLUoxetine (PROZAC) 40 MG capsule Take 2 pills once a day 60 capsule 3   fluticasone (FLONASE) 50 MCG/ACT nasal spray Place 2 sprays into both nostrils daily. 16 g 6   furosemide (LASIX) 20 MG tablet Take 1 tablet (20 mg total) by mouth 2 (two) times daily. 60 tablet 0   HUMALOG KWIKPEN 100 UNIT/ML KwikPen Inject 1-10 Units into the skin Barbara admin instructions. Inject 1-10 units into the skin before meals and at bedtime, PER SLIDING SCALE: BGL 151-200 = 1 unit; 201-250 = 2 units; 251-300 = 4 units; 301-350 = 6 units; 351-400 = 8 units; 401-450 = 10 units     levETIRAcetam (KEPPRA) 500 MG tablet Take 1 tablet (500 mg total) by mouth 2 (two) times daily. 60 tablet 0   loperamide (IMODIUM) 2 MG capsule Take 4 mg by mouth every 6 (six) hours.     LORazepam (ATIVAN) 0.5 MG tablet TAKE (1) TABLET BY MOUTH TWICE DAILY. (Patient taking differently: Take 0.5 mg by mouth 2 (two) times daily.) 60 tablet 0   metoprolol succinate (TOPROL-XL) 25 MG 24 hr tablet TAKE (1) TABLET BY MOUTH IN THE MORNING.  (Patient taking differently: Take 25 mg by mouth daily.) 30 tablet 0   Multiple Vitamins-Minerals (THERA-M) TABS TAKE (1) TABLET BY MOUTH ONCE DAILY. (Patient taking differently: Take 1 tablet by mouth daily.) 30 tablet 0   NIFEdipine (PROCARDIA XL/NIFEDICAL XL) 60 MG 24 hr tablet Take 60 mg by mouth 2 (two) times daily.     olopatadine (PATADAY) 0.1 % ophthalmic solution Place 1 drop into both eyes 2 (two) times daily.     oxybutynin (DITROPAN-XL) 5 MG 24 hr tablet TAKE (1) TABLET BY MOUTH ONCE DAILY. (Patient taking differently: Take 5 mg by mouth at bedtime.) 30 tablet 0   rosuvastatin (CRESTOR) 10 MG tablet Take 10 mg by mouth at bedtime.     sodium bicarbonate 650 MG tablet  Take 650 mg by mouth 3 (three) times daily.     thiamine (VITAMIN B1) 100 MG tablet TAKE (1) TABLET BY MOUTH ONCE DAILY. (Patient taking differently: Take 100 mg by mouth daily.) 30 tablet 0   traMADol (ULTRAM) 50 MG tablet Take 0.5 tablets by mouth every 6 (six) hours as needed for moderate pain or severe pain.     No facility-administered medications prior to visit.    PAST MEDICAL HISTORY: Past Medical History:  Diagnosis Date   Acute lower UTI 06/04/2018   Acute metabolic encephalopathy 03/11/2021   Acute on chronic respiratory failure with hypoxia (HCC) 07/20/2021   Acute respiratory failure requiring reintubation (HCC) 11/09/2012   Acute respiratory failure with hypoxia (HCC) 12/03/2020   AKI (acute kidney injury) (HCC) 06/04/2018   Alcohol use    Ankle fracture, lateral malleolus, closed 2013   Anxiety    Aortic atherosclerosis (HCC) 03/10/2021   Atherosclerosis of native artery of both legs with gangrene (HCC)    Breast mass, left 2013   CHF (congestive heart failure) (HCC)    a. EF 20-25% by echo in 05/2016 with cath showing normal cors b. EF 50-55% in 07/2020 c. 01/2021: EF at 55-60% with moderate LVH   Chronic anemia    CKD (chronic kidney disease), stage III (HCC)    Cocaine abuse (HCC)    COPD  (chronic obstructive pulmonary disease) (HCC)    Diabetes mellitus, type 2 (HCC)    Diabetic Charcot foot (HCC)    DKA (diabetic ketoacidosis) (HCC) 10/29/2020   Essential hypertension    H/O open hand wound 11/22/2018   History of cardiomyopathy    History of GI bleed 2011   Hyperlipidemia    Lactic acid acidosis 09/11/2014   Metabolic encephalopathy 03/11/2021   Noncompliance    Obesity    Oral candidiasis 03/10/2021   PAF (paroxysmal atrial fibrillation) (HCC)    Panic attacks    PAT (paroxysmal atrial tachycardia) (HCC)    Previously on Amiodarone   Pneumonia due to COVID-19 virus 12/03/2020   Seizures (HCC) 10/01/2020   Sepsis due to skin infection (HCC) 04/01/2021   Shock (HCC) 10/29/2020   Sleep apnea    Not on CPAP   Stroke (HCC) 2018   Thrombocytosis 08/02/2017   Tobacco abuse    UGI bleed    Urinary incontinence     PAST SURGICAL HISTORY: Past Surgical History:  Procedure Laterality Date   ANGIOPLASTY ILLIAC ARTERY Right 04/23/2021   Procedure: ANGIOPLASTY AND STENT SUPERFICIAL FEMORAL ARTERY;  Surgeon: Leonie Douglas, MD;  Location: [facility] OR;  Service: Vascular;  Laterality: Right;   AORTOGRAM  04/23/2021   Procedure: AORTOGRAM;  Surgeon: Leonie Douglas, MD;  Location: [facility] OR;  Service: Vascular;;   AORTOGRAM Left 04/30/2021   Procedure: left lower extremity angiogram with second order cannulation;  Surgeon: Leonie Douglas, MD;  Location: [facility] OR;  Service: Vascular;  Laterality: Left;   BIOPSY  11/12/2020   Procedure: BIOPSY;  Surgeon: Dolores Frame, MD;  Location: [facility] ENDO SUITE;  Service: Gastroenterology;;   BREAST BIOPSY     CARDIAC CATHETERIZATION N/A 07/28/2016   Procedure: Left Heart Cath and Coronary Angiography;  Surgeon: Corky Crafts, MD;  Location: [facility] INVASIVE CV LAB;  Service: Cardiovascular;  Laterality: N/A;   COLONOSCOPY N/A 05/10/2019   Procedure: COLONOSCOPY;  Surgeon: West Bali, MD;  Location: [facility] SUITE;  Service:  Endoscopy;  Laterality: N/A;  Phenergan 12.5 mg IV in pre-op  DILATION AND CURETTAGE OF UTERUS     ESOPHAGOGASTRODUODENOSCOPY (EGD) WITH PROPOFOL N/A 11/12/2020   Procedure: ESOPHAGOGASTRODUODENOSCOPY (EGD) WITH PROPOFOL;  Surgeon: Dolores Frame, MD;  Location: [facility];  Service: Gastroenterology;  Laterality: N/A;   I & D EXTREMITY Bilateral 09/22/2017   Procedure: BILATERAL DEBRIDEMENT LEG/FOOT ULCERS, APPLY VERAFLO WOUND VAC;  Surgeon: Nadara Mustard, MD;  Location: [facility] OR;  Service: Orthopedics;  Laterality: Bilateral;   I & D EXTREMITY Right 10/11/2018   Procedure: IRRIGATION AND DEBRIDEMENT RIGHT HAND;  Surgeon: Dominica Severin, MD;  Location: [facility] OR;  Service: Orthopedics;  Laterality: Right;   I & D EXTREMITY Right 10/13/2018   Procedure: REPEAT IRRIGATION AND DEBRIDEMENT RIGHT HAND;  Surgeon: Dominica Severin, MD;  Location: [facility] OR;  Service: Orthopedics;  Laterality: Right;   I & D EXTREMITY Right 11/22/2018   Procedure: IRRIGATION AND DEBRIDEMENT AND PINNING RIGHT HAND;  Surgeon: Dominica Severin, MD;  Location: [facility] OR;  Service: Orthopedics;  Laterality: Right;   IR RADIOLOGIST EVAL & MGMT  07/05/2018   LOWER EXTREMITY ANGIOGRAM Bilateral 04/23/2021   Procedure: RIGHT  AND LEFT LOWER EXTREMITY ANGIOGRAM;  Surgeon: Leonie Douglas, MD;  Location: [facility] OR;  Service: Vascular;  Laterality: Bilateral;   POLYPECTOMY  05/10/2019   Procedure: POLYPECTOMY;  Surgeon: West Bali, MD;  Location: [facility];  Service: Endoscopy;;   SKIN SPLIT GRAFT Bilateral 09/28/2017   Procedure: BILATERAL SPLIT THICKNESS SKIN GRAFT LEGS/FEET AND APPLY VAC;  Surgeon: Nadara Mustard, MD;  Location: [facility] OR;  Service: Orthopedics;  Laterality: Bilateral;   SKIN SPLIT GRAFT Right 11/22/2018   Procedure: SKIN GRAFT SPLIT THICKNESS;  Surgeon: Dominica Severin, MD;  Location: [facility] OR;  Service: Orthopedics;  Laterality: Right;    FAMILY HISTORY: Family History  Problem Relation Age of Onset    Hypertension Mother    Heart attack Mother    Hypertension Father        CABG    Hypertension Sister    Hypertension Brother    Hypertension Sister    Cancer Sister        breast    Arthritis Other    Cancer Other    Diabetes Other    Asthma Other    Hypertension Daughter    Hypertension Son     SOCIAL HISTORY: Social History   Socioeconomic History   Marital status: Widowed    Spouse name: Not on file   Number of children: 2   Years of education: 9   Highest education level: 9th grade  Occupational History   Occupation: Disabled  Tobacco Use   Smoking status: Former    Current packs/day: 0.00    Average packs/day: 0.3 packs/day for 20.0 years (5.0 ttl pk-yrs)    Types: Cigarettes    Start date: 06/03/1997    Quit date: 06/03/2017    Years since quitting: 6.4    Passive exposure: Never   Smokeless tobacco: Never  Vaping Use   Vaping status: Never Used  Substance and Sexual Activity   Alcohol use: Not Currently    Alcohol/week: 0.0 standard drinks of alcohol    Comment: Quit in 2017   Drug use: Not Currently    Frequency: 3.0 times per week    Types: Marijuana, Cocaine    Comment: none since August 2018   Sexual activity: Not on file  Other Topics Concern   Not on file  Social History Narrative   Right handed   Drinks 1-2 cups  caffeine daily   Social Drivers of Health   Financial Resource Strain: Medium Risk (10/15/2020)   Overall Financial Resource Strain (CARDIA)    Difficulty of Paying Living Expenses: Somewhat hard  Food Insecurity: Food Insecurity Present (10/15/2020)   Hunger Vital Sign    Worried About Running Out of Food in the Last Year: Sometimes true    Ran Out of Food in the Last Year: Sometimes true  Transportation Needs: No Transportation Needs (10/15/2020)   PRAPARE - Administrator, Civil Service (Medical): No    Lack of Transportation (Non-Medical): No  Physical Activity: Insufficiently Active (10/15/2020)   Exercise Vital  Sign    Days of Exercise per Week: 7 days    Minutes of Exercise per Session: 20 min  Stress: Stress Concern Present (10/15/2020)   [employer] of Occupational Health - Occupational Stress Questionnaire    Feeling of Stress : To some extent  Social Connections: Moderately Isolated (10/15/2020)   Social Connection and Isolation Panel [NHANES]    Frequency of Communication with Friends and Family: More than three times a week    Frequency of Social Gatherings with Friends and Family: More than three times a week    Attends Religious Services: More than 4 times per year    Active Member of [employer] or Organizations: No    Attends Banker Meetings: Never    Marital Status: Widowed  Intimate Partner Violence: Not At Risk (10/15/2020)   Humiliation, Afraid, Rape, and Kick questionnaire    Fear of Current or Ex-Partner: No    Emotionally Abused: No    Physically Abused: No    Sexually Abused: No   PHYSICAL EXAM  There were no vitals filed for this visit.  There is no height or weight on file to calculate BMI.  Generalized: Well developed, in no acute distress  Neurological examination  Mentation: Alert oriented to time, place, history taking. Follows all commands speech and language fluent Cranial nerve II-XII: Pupils were equal round reactive to light. Extraocular movements were full, visual field were full on confrontational test. Facial sensation and strength were normal.  Head turning and shoulder shrug  were normal and symmetric. Motor: Overall good strength, no significant muscle weakness was noted with the exception of the left upper arm due to reported recent vaccination Sensory: Sensory testing is intact to soft touch on all 4 extremities. No evidence of extinction is noted.  Coordination: Cerebellar testing reveals good finger-nose-finger and heel-to-shin bilaterally.  Gait and station: In a wheelchair, is nonambulatory Reflexes: Deep tendon reflexes are  symmetric and normal bilaterally.   DIAGNOSTIC DATA (LABS, IMAGING, TESTING) - I reviewed patient records, labs, notes, testing and imaging myself where available.  Lab Results  Component Value Date   WBC 6.6 09/16/2023   HGB 9.3 (L) 09/16/2023   HCT 29.7 (L) 09/16/2023   MCV 94.0 09/16/2023   PLT 197 09/16/2023      Component Value Date/Time   NA 143 09/16/2023 2324   NA 141 03/31/2023 0828   K 3.9 09/16/2023 2324   CL 114 (H) 09/16/2023 2324   CO2 20 (L) 09/16/2023 2324   GLUCOSE 176 (H) 09/16/2023 2324   BUN 56 (H) 09/16/2023 2324   BUN 48 (H) 03/31/2023 0828   CREATININE 3.46 (H) 09/16/2023 2324   CREATININE 1.56 (H) 07/26/2018 1513   CALCIUM 8.9 09/16/2023 2324   CALCIUM 8.9 09/13/2014 0703   PROT 6.5 09/16/2023 2324   PROT 5.5 (L) 03/31/2023  7829   ALBUMIN 3.0 (L) 09/16/2023 2324   ALBUMIN 3.3 (L) 03/31/2023 0828   AST 47 (H) 09/16/2023 2324   ALT 65 (H) 09/16/2023 2324   ALKPHOS 76 09/16/2023 2324   BILITOT 0.6 09/16/2023 2324   BILITOT <0.2 03/31/2023 0828   GFRNONAA 15 (L) 09/16/2023 2324   GFRNONAA 37 (L) 07/26/2018 1513   GFRAA 54 (L) 05/09/2019 0956   GFRAA 43 (L) 07/26/2018 1513   Lab Results  Component Value Date   CHOL 184 03/31/2023   HDL 60 03/31/2023   LDLCALC 108 (H) 03/31/2023   LDLDIRECT 122 (H) 01/08/2010   TRIG 86 03/31/2023   CHOLHDL 3.1 03/31/2023   Lab Results  Component Value Date   HGBA1C 6.1 (H) 03/31/2023   Lab Results  Component Value Date   VITAMINB12 696 05/06/2021   Lab Results  Component Value Date   TSH 1.330 03/31/2023    Margie Ege, AGNP-C, DNP 11/10/2023, 2:34 PM [facility] [address] [place], [place] [address] [phone]

## 2023-11-11 ENCOUNTER — Ambulatory Visit: Payer: 59 | Admitting: Neurology

## 2023-11-15 DIAGNOSIS — E1151 Type 2 diabetes mellitus with diabetic peripheral angiopathy without gangrene: Secondary | ICD-10-CM | POA: Diagnosis not present

## 2023-11-15 DIAGNOSIS — R269 Unspecified abnormalities of gait and mobility: Secondary | ICD-10-CM | POA: Diagnosis not present

## 2023-11-15 DIAGNOSIS — K219 Gastro-esophageal reflux disease without esophagitis: Secondary | ICD-10-CM | POA: Diagnosis not present

## 2023-11-15 DIAGNOSIS — M6281 Muscle weakness (generalized): Secondary | ICD-10-CM | POA: Diagnosis not present

## 2023-11-15 DIAGNOSIS — J9611 Chronic respiratory failure with hypoxia: Secondary | ICD-10-CM | POA: Diagnosis not present

## 2023-11-15 DIAGNOSIS — G4700 Insomnia, unspecified: Secondary | ICD-10-CM | POA: Diagnosis not present

## 2023-11-15 DIAGNOSIS — I130 Hypertensive heart and chronic kidney disease with heart failure and stage 1 through stage 4 chronic kidney disease, or unspecified chronic kidney disease: Secondary | ICD-10-CM | POA: Diagnosis not present

## 2023-11-15 DIAGNOSIS — E46 Unspecified protein-calorie malnutrition: Secondary | ICD-10-CM | POA: Diagnosis not present

## 2023-11-15 DIAGNOSIS — D631 Anemia in chronic kidney disease: Secondary | ICD-10-CM | POA: Diagnosis not present

## 2023-11-15 DIAGNOSIS — R296 Repeated falls: Secondary | ICD-10-CM | POA: Diagnosis not present

## 2023-11-15 DIAGNOSIS — I700 Atherosclerosis of aorta: Secondary | ICD-10-CM | POA: Diagnosis not present

## 2023-11-15 DIAGNOSIS — I480 Paroxysmal atrial fibrillation: Secondary | ICD-10-CM | POA: Diagnosis not present

## 2023-11-15 DIAGNOSIS — J449 Chronic obstructive pulmonary disease, unspecified: Secondary | ICD-10-CM | POA: Diagnosis not present

## 2023-11-15 DIAGNOSIS — E1122 Type 2 diabetes mellitus with diabetic chronic kidney disease: Secondary | ICD-10-CM | POA: Diagnosis not present

## 2023-11-15 DIAGNOSIS — I70219 Atherosclerosis of native arteries of extremities with intermittent claudication, unspecified extremity: Secondary | ICD-10-CM | POA: Diagnosis not present

## 2023-11-15 DIAGNOSIS — N184 Chronic kidney disease, stage 4 (severe): Secondary | ICD-10-CM | POA: Diagnosis not present

## 2023-11-15 DIAGNOSIS — I5042 Chronic combined systolic (congestive) and diastolic (congestive) heart failure: Secondary | ICD-10-CM | POA: Diagnosis not present

## 2023-11-15 DIAGNOSIS — E785 Hyperlipidemia, unspecified: Secondary | ICD-10-CM | POA: Diagnosis not present

## 2023-11-15 DIAGNOSIS — G894 Chronic pain syndrome: Secondary | ICD-10-CM | POA: Diagnosis not present

## 2023-11-15 DIAGNOSIS — M48061 Spinal stenosis, lumbar region without neurogenic claudication: Secondary | ICD-10-CM | POA: Diagnosis not present

## 2023-11-15 DIAGNOSIS — N390 Urinary tract infection, site not specified: Secondary | ICD-10-CM | POA: Diagnosis not present

## 2023-11-16 ENCOUNTER — Ambulatory Visit: Payer: 59 | Admitting: Neurology

## 2023-11-19 DIAGNOSIS — I480 Paroxysmal atrial fibrillation: Secondary | ICD-10-CM | POA: Diagnosis not present

## 2023-11-19 DIAGNOSIS — E46 Unspecified protein-calorie malnutrition: Secondary | ICD-10-CM | POA: Diagnosis not present

## 2023-11-19 DIAGNOSIS — I5042 Chronic combined systolic (congestive) and diastolic (congestive) heart failure: Secondary | ICD-10-CM | POA: Diagnosis not present

## 2023-11-19 DIAGNOSIS — E1151 Type 2 diabetes mellitus with diabetic peripheral angiopathy without gangrene: Secondary | ICD-10-CM | POA: Diagnosis not present

## 2023-11-19 DIAGNOSIS — D631 Anemia in chronic kidney disease: Secondary | ICD-10-CM | POA: Diagnosis not present

## 2023-11-19 DIAGNOSIS — K219 Gastro-esophageal reflux disease without esophagitis: Secondary | ICD-10-CM | POA: Diagnosis not present

## 2023-11-19 DIAGNOSIS — I70219 Atherosclerosis of native arteries of extremities with intermittent claudication, unspecified extremity: Secondary | ICD-10-CM | POA: Diagnosis not present

## 2023-11-19 DIAGNOSIS — J9611 Chronic respiratory failure with hypoxia: Secondary | ICD-10-CM | POA: Diagnosis not present

## 2023-11-19 DIAGNOSIS — I700 Atherosclerosis of aorta: Secondary | ICD-10-CM | POA: Diagnosis not present

## 2023-11-19 DIAGNOSIS — M48061 Spinal stenosis, lumbar region without neurogenic claudication: Secondary | ICD-10-CM | POA: Diagnosis not present

## 2023-11-19 DIAGNOSIS — N184 Chronic kidney disease, stage 4 (severe): Secondary | ICD-10-CM | POA: Diagnosis not present

## 2023-11-19 DIAGNOSIS — M6281 Muscle weakness (generalized): Secondary | ICD-10-CM | POA: Diagnosis not present

## 2023-11-19 DIAGNOSIS — E785 Hyperlipidemia, unspecified: Secondary | ICD-10-CM | POA: Diagnosis not present

## 2023-11-19 DIAGNOSIS — R269 Unspecified abnormalities of gait and mobility: Secondary | ICD-10-CM | POA: Diagnosis not present

## 2023-11-19 DIAGNOSIS — J449 Chronic obstructive pulmonary disease, unspecified: Secondary | ICD-10-CM | POA: Diagnosis not present

## 2023-11-19 DIAGNOSIS — E1122 Type 2 diabetes mellitus with diabetic chronic kidney disease: Secondary | ICD-10-CM | POA: Diagnosis not present

## 2023-11-19 DIAGNOSIS — G894 Chronic pain syndrome: Secondary | ICD-10-CM | POA: Diagnosis not present

## 2023-11-19 DIAGNOSIS — N390 Urinary tract infection, site not specified: Secondary | ICD-10-CM | POA: Diagnosis not present

## 2023-11-19 DIAGNOSIS — G4700 Insomnia, unspecified: Secondary | ICD-10-CM | POA: Diagnosis not present

## 2023-11-19 DIAGNOSIS — I130 Hypertensive heart and chronic kidney disease with heart failure and stage 1 through stage 4 chronic kidney disease, or unspecified chronic kidney disease: Secondary | ICD-10-CM | POA: Diagnosis not present

## 2023-11-19 DIAGNOSIS — R296 Repeated falls: Secondary | ICD-10-CM | POA: Diagnosis not present

## 2023-11-22 DIAGNOSIS — I70219 Atherosclerosis of native arteries of extremities with intermittent claudication, unspecified extremity: Secondary | ICD-10-CM | POA: Diagnosis not present

## 2023-11-22 DIAGNOSIS — I700 Atherosclerosis of aorta: Secondary | ICD-10-CM | POA: Diagnosis not present

## 2023-11-22 DIAGNOSIS — E1151 Type 2 diabetes mellitus with diabetic peripheral angiopathy without gangrene: Secondary | ICD-10-CM | POA: Diagnosis not present

## 2023-11-22 DIAGNOSIS — N184 Chronic kidney disease, stage 4 (severe): Secondary | ICD-10-CM | POA: Diagnosis not present

## 2023-11-22 DIAGNOSIS — E1122 Type 2 diabetes mellitus with diabetic chronic kidney disease: Secondary | ICD-10-CM | POA: Diagnosis not present

## 2023-11-22 DIAGNOSIS — E46 Unspecified protein-calorie malnutrition: Secondary | ICD-10-CM | POA: Diagnosis not present

## 2023-11-22 DIAGNOSIS — N390 Urinary tract infection, site not specified: Secondary | ICD-10-CM | POA: Diagnosis not present

## 2023-11-22 DIAGNOSIS — G894 Chronic pain syndrome: Secondary | ICD-10-CM | POA: Diagnosis not present

## 2023-11-22 DIAGNOSIS — I5042 Chronic combined systolic (congestive) and diastolic (congestive) heart failure: Secondary | ICD-10-CM | POA: Diagnosis not present

## 2023-11-22 DIAGNOSIS — G4700 Insomnia, unspecified: Secondary | ICD-10-CM | POA: Diagnosis not present

## 2023-11-22 DIAGNOSIS — D631 Anemia in chronic kidney disease: Secondary | ICD-10-CM | POA: Diagnosis not present

## 2023-11-22 DIAGNOSIS — J9611 Chronic respiratory failure with hypoxia: Secondary | ICD-10-CM | POA: Diagnosis not present

## 2023-11-22 DIAGNOSIS — I130 Hypertensive heart and chronic kidney disease with heart failure and stage 1 through stage 4 chronic kidney disease, or unspecified chronic kidney disease: Secondary | ICD-10-CM | POA: Diagnosis not present

## 2023-11-22 DIAGNOSIS — E785 Hyperlipidemia, unspecified: Secondary | ICD-10-CM | POA: Diagnosis not present

## 2023-11-22 DIAGNOSIS — I480 Paroxysmal atrial fibrillation: Secondary | ICD-10-CM | POA: Diagnosis not present

## 2023-11-22 DIAGNOSIS — K219 Gastro-esophageal reflux disease without esophagitis: Secondary | ICD-10-CM | POA: Diagnosis not present

## 2023-11-22 DIAGNOSIS — J449 Chronic obstructive pulmonary disease, unspecified: Secondary | ICD-10-CM | POA: Diagnosis not present

## 2023-11-22 DIAGNOSIS — M6281 Muscle weakness (generalized): Secondary | ICD-10-CM | POA: Diagnosis not present

## 2023-11-22 DIAGNOSIS — R296 Repeated falls: Secondary | ICD-10-CM | POA: Diagnosis not present

## 2023-11-22 DIAGNOSIS — R269 Unspecified abnormalities of gait and mobility: Secondary | ICD-10-CM | POA: Diagnosis not present

## 2023-11-22 DIAGNOSIS — M48061 Spinal stenosis, lumbar region without neurogenic claudication: Secondary | ICD-10-CM | POA: Diagnosis not present

## 2023-11-24 DIAGNOSIS — I480 Paroxysmal atrial fibrillation: Secondary | ICD-10-CM | POA: Diagnosis not present

## 2023-11-24 DIAGNOSIS — E785 Hyperlipidemia, unspecified: Secondary | ICD-10-CM | POA: Diagnosis not present

## 2023-11-24 DIAGNOSIS — K219 Gastro-esophageal reflux disease without esophagitis: Secondary | ICD-10-CM | POA: Diagnosis not present

## 2023-11-24 DIAGNOSIS — E46 Unspecified protein-calorie malnutrition: Secondary | ICD-10-CM | POA: Diagnosis not present

## 2023-11-24 DIAGNOSIS — I700 Atherosclerosis of aorta: Secondary | ICD-10-CM | POA: Diagnosis not present

## 2023-11-24 DIAGNOSIS — J9611 Chronic respiratory failure with hypoxia: Secondary | ICD-10-CM | POA: Diagnosis not present

## 2023-11-24 DIAGNOSIS — I5042 Chronic combined systolic (congestive) and diastolic (congestive) heart failure: Secondary | ICD-10-CM | POA: Diagnosis not present

## 2023-11-24 DIAGNOSIS — I130 Hypertensive heart and chronic kidney disease with heart failure and stage 1 through stage 4 chronic kidney disease, or unspecified chronic kidney disease: Secondary | ICD-10-CM | POA: Diagnosis not present

## 2023-11-24 DIAGNOSIS — R296 Repeated falls: Secondary | ICD-10-CM | POA: Diagnosis not present

## 2023-11-24 DIAGNOSIS — J449 Chronic obstructive pulmonary disease, unspecified: Secondary | ICD-10-CM | POA: Diagnosis not present

## 2023-11-24 DIAGNOSIS — I70219 Atherosclerosis of native arteries of extremities with intermittent claudication, unspecified extremity: Secondary | ICD-10-CM | POA: Diagnosis not present

## 2023-11-24 DIAGNOSIS — D631 Anemia in chronic kidney disease: Secondary | ICD-10-CM | POA: Diagnosis not present

## 2023-11-24 DIAGNOSIS — G4700 Insomnia, unspecified: Secondary | ICD-10-CM | POA: Diagnosis not present

## 2023-11-24 DIAGNOSIS — G894 Chronic pain syndrome: Secondary | ICD-10-CM | POA: Diagnosis not present

## 2023-11-24 DIAGNOSIS — M48061 Spinal stenosis, lumbar region without neurogenic claudication: Secondary | ICD-10-CM | POA: Diagnosis not present

## 2023-11-24 DIAGNOSIS — N390 Urinary tract infection, site not specified: Secondary | ICD-10-CM | POA: Diagnosis not present

## 2023-11-24 DIAGNOSIS — E1151 Type 2 diabetes mellitus with diabetic peripheral angiopathy without gangrene: Secondary | ICD-10-CM | POA: Diagnosis not present

## 2023-11-24 DIAGNOSIS — E1122 Type 2 diabetes mellitus with diabetic chronic kidney disease: Secondary | ICD-10-CM | POA: Diagnosis not present

## 2023-11-24 DIAGNOSIS — M6281 Muscle weakness (generalized): Secondary | ICD-10-CM | POA: Diagnosis not present

## 2023-11-24 DIAGNOSIS — N184 Chronic kidney disease, stage 4 (severe): Secondary | ICD-10-CM | POA: Diagnosis not present

## 2023-11-24 DIAGNOSIS — R269 Unspecified abnormalities of gait and mobility: Secondary | ICD-10-CM | POA: Diagnosis not present

## 2023-11-28 DIAGNOSIS — R269 Unspecified abnormalities of gait and mobility: Secondary | ICD-10-CM | POA: Diagnosis not present

## 2023-11-28 DIAGNOSIS — M6281 Muscle weakness (generalized): Secondary | ICD-10-CM | POA: Diagnosis not present

## 2023-11-29 DIAGNOSIS — M6281 Muscle weakness (generalized): Secondary | ICD-10-CM | POA: Diagnosis not present

## 2023-11-29 DIAGNOSIS — I5042 Chronic combined systolic (congestive) and diastolic (congestive) heart failure: Secondary | ICD-10-CM | POA: Diagnosis not present

## 2023-11-29 DIAGNOSIS — G894 Chronic pain syndrome: Secondary | ICD-10-CM | POA: Diagnosis not present

## 2023-11-29 DIAGNOSIS — I700 Atherosclerosis of aorta: Secondary | ICD-10-CM | POA: Diagnosis not present

## 2023-11-29 DIAGNOSIS — K219 Gastro-esophageal reflux disease without esophagitis: Secondary | ICD-10-CM | POA: Diagnosis not present

## 2023-11-29 DIAGNOSIS — I70219 Atherosclerosis of native arteries of extremities with intermittent claudication, unspecified extremity: Secondary | ICD-10-CM | POA: Diagnosis not present

## 2023-11-29 DIAGNOSIS — E1122 Type 2 diabetes mellitus with diabetic chronic kidney disease: Secondary | ICD-10-CM | POA: Diagnosis not present

## 2023-11-29 DIAGNOSIS — E46 Unspecified protein-calorie malnutrition: Secondary | ICD-10-CM | POA: Diagnosis not present

## 2023-11-29 DIAGNOSIS — E1151 Type 2 diabetes mellitus with diabetic peripheral angiopathy without gangrene: Secondary | ICD-10-CM | POA: Diagnosis not present

## 2023-11-29 DIAGNOSIS — R269 Unspecified abnormalities of gait and mobility: Secondary | ICD-10-CM | POA: Diagnosis not present

## 2023-11-29 DIAGNOSIS — E785 Hyperlipidemia, unspecified: Secondary | ICD-10-CM | POA: Diagnosis not present

## 2023-11-29 DIAGNOSIS — M48061 Spinal stenosis, lumbar region without neurogenic claudication: Secondary | ICD-10-CM | POA: Diagnosis not present

## 2023-11-29 DIAGNOSIS — D631 Anemia in chronic kidney disease: Secondary | ICD-10-CM | POA: Diagnosis not present

## 2023-11-29 DIAGNOSIS — J9611 Chronic respiratory failure with hypoxia: Secondary | ICD-10-CM | POA: Diagnosis not present

## 2023-11-29 DIAGNOSIS — I130 Hypertensive heart and chronic kidney disease with heart failure and stage 1 through stage 4 chronic kidney disease, or unspecified chronic kidney disease: Secondary | ICD-10-CM | POA: Diagnosis not present

## 2023-11-29 DIAGNOSIS — N184 Chronic kidney disease, stage 4 (severe): Secondary | ICD-10-CM | POA: Diagnosis not present

## 2023-11-29 DIAGNOSIS — I480 Paroxysmal atrial fibrillation: Secondary | ICD-10-CM | POA: Diagnosis not present

## 2023-11-29 DIAGNOSIS — R296 Repeated falls: Secondary | ICD-10-CM | POA: Diagnosis not present

## 2023-11-29 DIAGNOSIS — J449 Chronic obstructive pulmonary disease, unspecified: Secondary | ICD-10-CM | POA: Diagnosis not present

## 2023-11-29 DIAGNOSIS — G4700 Insomnia, unspecified: Secondary | ICD-10-CM | POA: Diagnosis not present

## 2023-12-06 DIAGNOSIS — M6281 Muscle weakness (generalized): Secondary | ICD-10-CM | POA: Diagnosis not present

## 2023-12-06 DIAGNOSIS — I700 Atherosclerosis of aorta: Secondary | ICD-10-CM | POA: Diagnosis not present

## 2023-12-06 DIAGNOSIS — J9611 Chronic respiratory failure with hypoxia: Secondary | ICD-10-CM | POA: Diagnosis not present

## 2023-12-06 DIAGNOSIS — R269 Unspecified abnormalities of gait and mobility: Secondary | ICD-10-CM | POA: Diagnosis not present

## 2023-12-06 DIAGNOSIS — I480 Paroxysmal atrial fibrillation: Secondary | ICD-10-CM | POA: Diagnosis not present

## 2023-12-06 DIAGNOSIS — E785 Hyperlipidemia, unspecified: Secondary | ICD-10-CM | POA: Diagnosis not present

## 2023-12-06 DIAGNOSIS — G4700 Insomnia, unspecified: Secondary | ICD-10-CM | POA: Diagnosis not present

## 2023-12-06 DIAGNOSIS — E46 Unspecified protein-calorie malnutrition: Secondary | ICD-10-CM | POA: Diagnosis not present

## 2023-12-06 DIAGNOSIS — I130 Hypertensive heart and chronic kidney disease with heart failure and stage 1 through stage 4 chronic kidney disease, or unspecified chronic kidney disease: Secondary | ICD-10-CM | POA: Diagnosis not present

## 2023-12-06 DIAGNOSIS — R296 Repeated falls: Secondary | ICD-10-CM | POA: Diagnosis not present

## 2023-12-06 DIAGNOSIS — J449 Chronic obstructive pulmonary disease, unspecified: Secondary | ICD-10-CM | POA: Diagnosis not present

## 2023-12-06 DIAGNOSIS — G894 Chronic pain syndrome: Secondary | ICD-10-CM | POA: Diagnosis not present

## 2023-12-06 DIAGNOSIS — M48061 Spinal stenosis, lumbar region without neurogenic claudication: Secondary | ICD-10-CM | POA: Diagnosis not present

## 2023-12-06 DIAGNOSIS — D631 Anemia in chronic kidney disease: Secondary | ICD-10-CM | POA: Diagnosis not present

## 2023-12-06 DIAGNOSIS — K219 Gastro-esophageal reflux disease without esophagitis: Secondary | ICD-10-CM | POA: Diagnosis not present

## 2023-12-06 DIAGNOSIS — E1122 Type 2 diabetes mellitus with diabetic chronic kidney disease: Secondary | ICD-10-CM | POA: Diagnosis not present

## 2023-12-06 DIAGNOSIS — N184 Chronic kidney disease, stage 4 (severe): Secondary | ICD-10-CM | POA: Diagnosis not present

## 2023-12-06 DIAGNOSIS — E1151 Type 2 diabetes mellitus with diabetic peripheral angiopathy without gangrene: Secondary | ICD-10-CM | POA: Diagnosis not present

## 2023-12-06 DIAGNOSIS — I70219 Atherosclerosis of native arteries of extremities with intermittent claudication, unspecified extremity: Secondary | ICD-10-CM | POA: Diagnosis not present

## 2023-12-06 DIAGNOSIS — I5042 Chronic combined systolic (congestive) and diastolic (congestive) heart failure: Secondary | ICD-10-CM | POA: Diagnosis not present

## 2023-12-13 ENCOUNTER — Encounter: Payer: Self-pay | Admitting: Primary Care

## 2023-12-13 ENCOUNTER — Ambulatory Visit (INDEPENDENT_AMBULATORY_CARE_PROVIDER_SITE_OTHER): Payer: 59 | Admitting: Primary Care

## 2023-12-13 VITALS — BP 132/87 | HR 87 | Ht 61.0 in | Wt 176.0 lb

## 2023-12-13 DIAGNOSIS — Z8669 Personal history of other diseases of the nervous system and sense organs: Secondary | ICD-10-CM | POA: Diagnosis not present

## 2023-12-13 DIAGNOSIS — I5042 Chronic combined systolic (congestive) and diastolic (congestive) heart failure: Secondary | ICD-10-CM

## 2023-12-13 DIAGNOSIS — I480 Paroxysmal atrial fibrillation: Secondary | ICD-10-CM

## 2023-12-13 DIAGNOSIS — R0683 Snoring: Secondary | ICD-10-CM | POA: Diagnosis not present
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
## 2023-12-13 NOTE — Patient Instructions (Addendum)
 -  OBSTRUCTIVE SLEEP APNEA (OSA): Obstructive Sleep Apnea is a condition where the airway becomes blocked during sleep, causing breathing to stop and start repeatedly. Given your history and current symptoms, we have ordered an in-lab sleep study to reassess your condition. Depending on the results, you may need to resume CPAP therapy.  -URINARY INCONTINENCE: Urinary incontinence is the loss of bladder control, which can lead to accidental urine leakage. To help manage this, please limit your fluid intake after 5 PM and make sure to use the restroom before going to bed.  INSTRUCTIONS:  Please follow up in approximately 6-8 weeks after your sleep study so we can discuss the results and determine if you need to resume CPAP therapy.

## 2023-12-16 DIAGNOSIS — M48061 Spinal stenosis, lumbar region without neurogenic claudication: Secondary | ICD-10-CM | POA: Diagnosis not present

## 2023-12-16 DIAGNOSIS — I480 Paroxysmal atrial fibrillation: Secondary | ICD-10-CM | POA: Diagnosis not present

## 2023-12-16 DIAGNOSIS — E1151 Type 2 diabetes mellitus with diabetic peripheral angiopathy without gangrene: Secondary | ICD-10-CM | POA: Diagnosis not present

## 2023-12-16 DIAGNOSIS — E1122 Type 2 diabetes mellitus with diabetic chronic kidney disease: Secondary | ICD-10-CM | POA: Diagnosis not present

## 2023-12-16 DIAGNOSIS — I130 Hypertensive heart and chronic kidney disease with heart failure and stage 1 through stage 4 chronic kidney disease, or unspecified chronic kidney disease: Secondary | ICD-10-CM | POA: Diagnosis not present

## 2023-12-16 DIAGNOSIS — R269 Unspecified abnormalities of gait and mobility: Secondary | ICD-10-CM | POA: Diagnosis not present

## 2023-12-16 DIAGNOSIS — J9611 Chronic respiratory failure with hypoxia: Secondary | ICD-10-CM | POA: Diagnosis not present

## 2023-12-16 DIAGNOSIS — R296 Repeated falls: Secondary | ICD-10-CM | POA: Diagnosis not present

## 2023-12-16 DIAGNOSIS — I5042 Chronic combined systolic (congestive) and diastolic (congestive) heart failure: Secondary | ICD-10-CM | POA: Diagnosis not present

## 2023-12-16 DIAGNOSIS — I70219 Atherosclerosis of native arteries of extremities with intermittent claudication, unspecified extremity: Secondary | ICD-10-CM | POA: Diagnosis not present

## 2023-12-16 DIAGNOSIS — K219 Gastro-esophageal reflux disease without esophagitis: Secondary | ICD-10-CM | POA: Diagnosis not present

## 2023-12-16 DIAGNOSIS — G894 Chronic pain syndrome: Secondary | ICD-10-CM | POA: Diagnosis not present

## 2023-12-16 DIAGNOSIS — J449 Chronic obstructive pulmonary disease, unspecified: Secondary | ICD-10-CM | POA: Diagnosis not present

## 2023-12-16 DIAGNOSIS — I700 Atherosclerosis of aorta: Secondary | ICD-10-CM | POA: Diagnosis not present

## 2023-12-16 DIAGNOSIS — E785 Hyperlipidemia, unspecified: Secondary | ICD-10-CM | POA: Diagnosis not present

## 2023-12-16 DIAGNOSIS — N184 Chronic kidney disease, stage 4 (severe): Secondary | ICD-10-CM | POA: Diagnosis not present

## 2023-12-16 DIAGNOSIS — M6281 Muscle weakness (generalized): Secondary | ICD-10-CM | POA: Diagnosis not present

## 2023-12-16 DIAGNOSIS — G4700 Insomnia, unspecified: Secondary | ICD-10-CM | POA: Diagnosis not present

## 2023-12-16 DIAGNOSIS — E46 Unspecified protein-calorie malnutrition: Secondary | ICD-10-CM | POA: Diagnosis not present

## 2023-12-16 DIAGNOSIS — D631 Anemia in chronic kidney disease: Secondary | ICD-10-CM | POA: Diagnosis not present

## 2023-12-20 DIAGNOSIS — M6281 Muscle weakness (generalized): Secondary | ICD-10-CM | POA: Diagnosis not present

## 2023-12-20 DIAGNOSIS — M48061 Spinal stenosis, lumbar region without neurogenic claudication: Secondary | ICD-10-CM | POA: Diagnosis not present

## 2023-12-20 DIAGNOSIS — J449 Chronic obstructive pulmonary disease, unspecified: Secondary | ICD-10-CM | POA: Diagnosis not present

## 2023-12-20 DIAGNOSIS — G894 Chronic pain syndrome: Secondary | ICD-10-CM | POA: Diagnosis not present

## 2023-12-20 DIAGNOSIS — I70219 Atherosclerosis of native arteries of extremities with intermittent claudication, unspecified extremity: Secondary | ICD-10-CM | POA: Diagnosis not present

## 2023-12-20 DIAGNOSIS — I700 Atherosclerosis of aorta: Secondary | ICD-10-CM | POA: Diagnosis not present

## 2023-12-20 DIAGNOSIS — K219 Gastro-esophageal reflux disease without esophagitis: Secondary | ICD-10-CM | POA: Diagnosis not present

## 2023-12-20 DIAGNOSIS — R269 Unspecified abnormalities of gait and mobility: Secondary | ICD-10-CM | POA: Diagnosis not present

## 2023-12-20 DIAGNOSIS — E785 Hyperlipidemia, unspecified: Secondary | ICD-10-CM | POA: Diagnosis not present

## 2023-12-20 DIAGNOSIS — J9611 Chronic respiratory failure with hypoxia: Secondary | ICD-10-CM | POA: Diagnosis not present

## 2023-12-20 DIAGNOSIS — I5042 Chronic combined systolic (congestive) and diastolic (congestive) heart failure: Secondary | ICD-10-CM | POA: Diagnosis not present

## 2023-12-20 DIAGNOSIS — I480 Paroxysmal atrial fibrillation: Secondary | ICD-10-CM | POA: Diagnosis not present

## 2023-12-20 DIAGNOSIS — E1122 Type 2 diabetes mellitus with diabetic chronic kidney disease: Secondary | ICD-10-CM | POA: Diagnosis not present

## 2023-12-20 DIAGNOSIS — D631 Anemia in chronic kidney disease: Secondary | ICD-10-CM | POA: Diagnosis not present

## 2023-12-20 DIAGNOSIS — R809 Proteinuria, unspecified: Secondary | ICD-10-CM | POA: Diagnosis not present

## 2023-12-20 DIAGNOSIS — G4700 Insomnia, unspecified: Secondary | ICD-10-CM | POA: Diagnosis not present

## 2023-12-20 DIAGNOSIS — N189 Chronic kidney disease, unspecified: Secondary | ICD-10-CM | POA: Diagnosis not present

## 2023-12-20 DIAGNOSIS — N184 Chronic kidney disease, stage 4 (severe): Secondary | ICD-10-CM | POA: Diagnosis not present

## 2023-12-20 DIAGNOSIS — I130 Hypertensive heart and chronic kidney disease with heart failure and stage 1 through stage 4 chronic kidney disease, or unspecified chronic kidney disease: Secondary | ICD-10-CM | POA: Diagnosis not present

## 2023-12-20 DIAGNOSIS — E1151 Type 2 diabetes mellitus with diabetic peripheral angiopathy without gangrene: Secondary | ICD-10-CM | POA: Diagnosis not present

## 2023-12-20 DIAGNOSIS — E46 Unspecified protein-calorie malnutrition: Secondary | ICD-10-CM | POA: Diagnosis not present

## 2023-12-20 DIAGNOSIS — R296 Repeated falls: Secondary | ICD-10-CM | POA: Diagnosis not present

## 2023-12-20 DIAGNOSIS — N185 Chronic kidney disease, stage 5: Secondary | ICD-10-CM | POA: Diagnosis not present

## 2023-12-20 DIAGNOSIS — D649 Anemia, unspecified: Secondary | ICD-10-CM | POA: Diagnosis not present

## 2023-12-23 DIAGNOSIS — Z961 Presence of intraocular lens: Secondary | ICD-10-CM | POA: Diagnosis not present

## 2023-12-23 DIAGNOSIS — J9690 Respiratory failure, unspecified, unspecified whether with hypoxia or hypercapnia: Secondary | ICD-10-CM | POA: Diagnosis not present

## 2023-12-23 DIAGNOSIS — G4700 Insomnia, unspecified: Secondary | ICD-10-CM | POA: Diagnosis not present

## 2023-12-23 DIAGNOSIS — I480 Paroxysmal atrial fibrillation: Secondary | ICD-10-CM | POA: Diagnosis not present

## 2023-12-23 DIAGNOSIS — E119 Type 2 diabetes mellitus without complications: Secondary | ICD-10-CM | POA: Diagnosis not present

## 2023-12-23 DIAGNOSIS — I10 Essential (primary) hypertension: Secondary | ICD-10-CM | POA: Diagnosis not present

## 2023-12-23 DIAGNOSIS — I639 Cerebral infarction, unspecified: Secondary | ICD-10-CM | POA: Diagnosis not present

## 2023-12-23 DIAGNOSIS — J449 Chronic obstructive pulmonary disease, unspecified: Secondary | ICD-10-CM | POA: Diagnosis not present

## 2023-12-23 DIAGNOSIS — N184 Chronic kidney disease, stage 4 (severe): Secondary | ICD-10-CM | POA: Diagnosis not present

## 2023-12-23 DIAGNOSIS — M6281 Muscle weakness (generalized): Secondary | ICD-10-CM | POA: Diagnosis not present

## 2023-12-29 DIAGNOSIS — E785 Hyperlipidemia, unspecified: Secondary | ICD-10-CM | POA: Diagnosis not present

## 2023-12-29 DIAGNOSIS — D631 Anemia in chronic kidney disease: Secondary | ICD-10-CM | POA: Diagnosis not present

## 2023-12-29 DIAGNOSIS — E46 Unspecified protein-calorie malnutrition: Secondary | ICD-10-CM | POA: Diagnosis not present

## 2023-12-29 DIAGNOSIS — N185 Chronic kidney disease, stage 5: Secondary | ICD-10-CM | POA: Diagnosis not present

## 2023-12-29 DIAGNOSIS — I5042 Chronic combined systolic (congestive) and diastolic (congestive) heart failure: Secondary | ICD-10-CM | POA: Diagnosis not present

## 2023-12-29 DIAGNOSIS — J449 Chronic obstructive pulmonary disease, unspecified: Secondary | ICD-10-CM | POA: Diagnosis not present

## 2023-12-29 DIAGNOSIS — G894 Chronic pain syndrome: Secondary | ICD-10-CM | POA: Diagnosis not present

## 2023-12-29 DIAGNOSIS — N2581 Secondary hyperparathyroidism of renal origin: Secondary | ICD-10-CM | POA: Diagnosis not present

## 2023-12-29 DIAGNOSIS — E1151 Type 2 diabetes mellitus with diabetic peripheral angiopathy without gangrene: Secondary | ICD-10-CM | POA: Diagnosis not present

## 2023-12-29 DIAGNOSIS — R296 Repeated falls: Secondary | ICD-10-CM | POA: Diagnosis not present

## 2023-12-29 DIAGNOSIS — I700 Atherosclerosis of aorta: Secondary | ICD-10-CM | POA: Diagnosis not present

## 2023-12-29 DIAGNOSIS — E1122 Type 2 diabetes mellitus with diabetic chronic kidney disease: Secondary | ICD-10-CM | POA: Diagnosis not present

## 2023-12-29 DIAGNOSIS — J9611 Chronic respiratory failure with hypoxia: Secondary | ICD-10-CM | POA: Diagnosis not present

## 2023-12-29 DIAGNOSIS — M48061 Spinal stenosis, lumbar region without neurogenic claudication: Secondary | ICD-10-CM | POA: Diagnosis not present

## 2023-12-29 DIAGNOSIS — G4700 Insomnia, unspecified: Secondary | ICD-10-CM | POA: Diagnosis not present

## 2023-12-29 DIAGNOSIS — N184 Chronic kidney disease, stage 4 (severe): Secondary | ICD-10-CM | POA: Diagnosis not present

## 2023-12-29 DIAGNOSIS — R808 Other proteinuria: Secondary | ICD-10-CM | POA: Diagnosis not present

## 2023-12-29 DIAGNOSIS — I480 Paroxysmal atrial fibrillation: Secondary | ICD-10-CM | POA: Diagnosis not present

## 2023-12-29 DIAGNOSIS — I130 Hypertensive heart and chronic kidney disease with heart failure and stage 1 through stage 4 chronic kidney disease, or unspecified chronic kidney disease: Secondary | ICD-10-CM | POA: Diagnosis not present

## 2023-12-29 DIAGNOSIS — E875 Hyperkalemia: Secondary | ICD-10-CM | POA: Diagnosis not present

## 2023-12-29 DIAGNOSIS — I70219 Atherosclerosis of native arteries of extremities with intermittent claudication, unspecified extremity: Secondary | ICD-10-CM | POA: Diagnosis not present

## 2023-12-29 DIAGNOSIS — K219 Gastro-esophageal reflux disease without esophagitis: Secondary | ICD-10-CM | POA: Diagnosis not present

## 2023-12-29 DIAGNOSIS — R269 Unspecified abnormalities of gait and mobility: Secondary | ICD-10-CM | POA: Diagnosis not present

## 2023-12-29 DIAGNOSIS — M6281 Muscle weakness (generalized): Secondary | ICD-10-CM | POA: Diagnosis not present

## 2023-12-30 ENCOUNTER — Other Ambulatory Visit: Payer: Self-pay

## 2023-12-30 DIAGNOSIS — N184 Chronic kidney disease, stage 4 (severe): Secondary | ICD-10-CM

## 2024-01-04 ENCOUNTER — Encounter (HOSPITAL_COMMUNITY): Payer: Self-pay | Admitting: Vascular Surgery

## 2024-01-04 ENCOUNTER — Inpatient Hospital Stay (HOSPITAL_COMMUNITY)
Admission: EM | Admit: 2024-01-04 | Discharge: 2024-01-10 | DRG: 683 | Disposition: A | Discharge status: Nursing Home (Includes ICF) | Source: Skilled Nursing Facility | Attending: Family Medicine | Admitting: Family Medicine

## 2024-01-04 ENCOUNTER — Emergency Department (HOSPITAL_COMMUNITY)

## 2024-01-04 ENCOUNTER — Other Ambulatory Visit: Payer: Self-pay

## 2024-01-04 DIAGNOSIS — E46 Unspecified protein-calorie malnutrition: Secondary | ICD-10-CM

## 2024-01-04 DIAGNOSIS — I10 Essential (primary) hypertension: Secondary | ICD-10-CM | POA: Diagnosis not present

## 2024-01-04 DIAGNOSIS — J449 Chronic obstructive pulmonary disease, unspecified: Secondary | ICD-10-CM | POA: Diagnosis present

## 2024-01-04 DIAGNOSIS — Z79899 Other long term (current) drug therapy: Secondary | ICD-10-CM

## 2024-01-04 DIAGNOSIS — Z6833 Body mass index (BMI) 33.0-33.9, adult: Secondary | ICD-10-CM

## 2024-01-04 DIAGNOSIS — K838 Other specified diseases of biliary tract: Secondary | ICD-10-CM | POA: Diagnosis present

## 2024-01-04 DIAGNOSIS — Z794 Long term (current) use of insulin: Secondary | ICD-10-CM | POA: Diagnosis not present

## 2024-01-04 DIAGNOSIS — Z8701 Personal history of pneumonia (recurrent): Secondary | ICD-10-CM

## 2024-01-04 DIAGNOSIS — Z7901 Long term (current) use of anticoagulants: Secondary | ICD-10-CM

## 2024-01-04 DIAGNOSIS — R7989 Other specified abnormal findings of blood chemistry: Secondary | ICD-10-CM | POA: Diagnosis not present

## 2024-01-04 DIAGNOSIS — F1011 Alcohol abuse, in remission: Secondary | ICD-10-CM | POA: Diagnosis present

## 2024-01-04 DIAGNOSIS — N185 Chronic kidney disease, stage 5: Secondary | ICD-10-CM | POA: Diagnosis not present

## 2024-01-04 DIAGNOSIS — I429 Cardiomyopathy, unspecified: Secondary | ICD-10-CM | POA: Diagnosis present

## 2024-01-04 DIAGNOSIS — Z87898 Personal history of other specified conditions: Secondary | ICD-10-CM | POA: Diagnosis not present

## 2024-01-04 DIAGNOSIS — Z8711 Personal history of peptic ulcer disease: Secondary | ICD-10-CM

## 2024-01-04 DIAGNOSIS — G40909 Epilepsy, unspecified, not intractable, without status epilepticus: Secondary | ICD-10-CM | POA: Diagnosis present

## 2024-01-04 DIAGNOSIS — K760 Fatty (change of) liver, not elsewhere classified: Secondary | ICD-10-CM | POA: Diagnosis not present

## 2024-01-04 DIAGNOSIS — Z825 Family history of asthma and other chronic lower respiratory diseases: Secondary | ICD-10-CM

## 2024-01-04 DIAGNOSIS — K828 Other specified diseases of gallbladder: Secondary | ICD-10-CM | POA: Diagnosis present

## 2024-01-04 DIAGNOSIS — I480 Paroxysmal atrial fibrillation: Secondary | ICD-10-CM | POA: Diagnosis present

## 2024-01-04 DIAGNOSIS — E11610 Type 2 diabetes mellitus with diabetic neuropathic arthropathy: Secondary | ICD-10-CM | POA: Diagnosis present

## 2024-01-04 DIAGNOSIS — E8809 Other disorders of plasma-protein metabolism, not elsewhere classified: Secondary | ICD-10-CM | POA: Diagnosis present

## 2024-01-04 DIAGNOSIS — Z91199 Patient's noncompliance with other medical treatment and regimen due to unspecified reason: Secondary | ICD-10-CM

## 2024-01-04 DIAGNOSIS — E782 Mixed hyperlipidemia: Secondary | ICD-10-CM | POA: Diagnosis not present

## 2024-01-04 DIAGNOSIS — Z5986 Financial insecurity: Secondary | ICD-10-CM

## 2024-01-04 DIAGNOSIS — I517 Cardiomegaly: Secondary | ICD-10-CM | POA: Diagnosis not present

## 2024-01-04 DIAGNOSIS — F1491 Cocaine use, unspecified, in remission: Secondary | ICD-10-CM | POA: Diagnosis not present

## 2024-01-04 DIAGNOSIS — Z7982 Long term (current) use of aspirin: Secondary | ICD-10-CM

## 2024-01-04 DIAGNOSIS — D649 Anemia, unspecified: Secondary | ICD-10-CM | POA: Diagnosis not present

## 2024-01-04 DIAGNOSIS — Z87891 Personal history of nicotine dependence: Secondary | ICD-10-CM

## 2024-01-04 DIAGNOSIS — R10811 Right upper quadrant abdominal tenderness: Secondary | ICD-10-CM | POA: Diagnosis not present

## 2024-01-04 DIAGNOSIS — Z743 Need for continuous supervision: Secondary | ICD-10-CM | POA: Diagnosis not present

## 2024-01-04 DIAGNOSIS — D631 Anemia in chronic kidney disease: Secondary | ICD-10-CM | POA: Diagnosis present

## 2024-01-04 DIAGNOSIS — R110 Nausea: Secondary | ICD-10-CM | POA: Diagnosis not present

## 2024-01-04 DIAGNOSIS — Z888 Allergy status to other drugs, medicaments and biological substances status: Secondary | ICD-10-CM

## 2024-01-04 DIAGNOSIS — I5042 Chronic combined systolic (congestive) and diastolic (congestive) heart failure: Secondary | ICD-10-CM | POA: Diagnosis present

## 2024-01-04 DIAGNOSIS — Z8249 Family history of ischemic heart disease and other diseases of the circulatory system: Secondary | ICD-10-CM

## 2024-01-04 DIAGNOSIS — Z1152 Encounter for screening for COVID-19: Secondary | ICD-10-CM | POA: Diagnosis not present

## 2024-01-04 DIAGNOSIS — F1410 Cocaine abuse, uncomplicated: Secondary | ICD-10-CM | POA: Diagnosis present

## 2024-01-04 DIAGNOSIS — R748 Abnormal levels of other serum enzymes: Secondary | ICD-10-CM

## 2024-01-04 DIAGNOSIS — I129 Hypertensive chronic kidney disease with stage 1 through stage 4 chronic kidney disease, or unspecified chronic kidney disease: Secondary | ICD-10-CM | POA: Diagnosis not present

## 2024-01-04 DIAGNOSIS — I132 Hypertensive heart and chronic kidney disease with heart failure and with stage 5 chronic kidney disease, or end stage renal disease: Secondary | ICD-10-CM | POA: Diagnosis present

## 2024-01-04 DIAGNOSIS — Z833 Family history of diabetes mellitus: Secondary | ICD-10-CM

## 2024-01-04 DIAGNOSIS — E1122 Type 2 diabetes mellitus with diabetic chronic kidney disease: Secondary | ICD-10-CM | POA: Diagnosis present

## 2024-01-04 DIAGNOSIS — R531 Weakness: Principal | ICD-10-CM

## 2024-01-04 DIAGNOSIS — R7401 Elevation of levels of liver transaminase levels: Secondary | ICD-10-CM | POA: Insufficient documentation

## 2024-01-04 DIAGNOSIS — N179 Acute kidney failure, unspecified: Principal | ICD-10-CM | POA: Diagnosis present

## 2024-01-04 DIAGNOSIS — E66811 Obesity, class 1: Secondary | ICD-10-CM | POA: Insufficient documentation

## 2024-01-04 DIAGNOSIS — R109 Unspecified abdominal pain: Secondary | ICD-10-CM | POA: Diagnosis not present

## 2024-01-04 DIAGNOSIS — Z8616 Personal history of COVID-19: Secondary | ICD-10-CM | POA: Diagnosis not present

## 2024-01-04 DIAGNOSIS — N184 Chronic kidney disease, stage 4 (severe): Secondary | ICD-10-CM | POA: Diagnosis not present

## 2024-01-04 DIAGNOSIS — Z8673 Personal history of transient ischemic attack (TIA), and cerebral infarction without residual deficits: Secondary | ICD-10-CM

## 2024-01-04 LAB — COMPREHENSIVE METABOLIC PANEL
ALT: 3761 U/L — ABNORMAL HIGH (ref 0–44)
AST: 4879 U/L — ABNORMAL HIGH (ref 15–41)
Albumin: 3 g/dL — ABNORMAL LOW (ref 3.5–5.0)
Alkaline Phosphatase: 139 U/L — ABNORMAL HIGH (ref 38–126)
Anion gap: 14 (ref 5–15)
BUN: 72 mg/dL — ABNORMAL HIGH (ref 6–20)
CO2: 19 mmol/L — ABNORMAL LOW (ref 22–32)
Calcium: 9 mg/dL (ref 8.9–10.3)
Chloride: 105 mmol/L (ref 98–111)
Creatinine, Ser: 4.91 mg/dL — ABNORMAL HIGH (ref 0.44–1.00)
GFR, Estimated: 10 mL/min — ABNORMAL LOW (ref >60)
Glucose, Bld: 94 mg/dL (ref 70–99)
Potassium: 3.8 mmol/L (ref 3.5–5.1)
Sodium: 138 mmol/L (ref 135–145)
Total Bilirubin: 1 mg/dL (ref 0.0–1.2)
Total Protein: 6.7 g/dL (ref 6.5–8.1)

## 2024-01-04 LAB — ACETAMINOPHEN LEVEL: Acetaminophen (Tylenol), Serum: <10 ug/mL — ABNORMAL LOW (ref 10–30)

## 2024-01-04 LAB — TROPONIN I (HIGH SENSITIVITY)
Troponin I (High Sensitivity): 48 ng/L — ABNORMAL HIGH (ref <18)
Troponin I (High Sensitivity): 46 ng/L — ABNORMAL HIGH (ref <18)

## 2024-01-04 LAB — RESP PANEL BY RT-PCR (RSV, FLU A&B, COVID)  RVPGX2
Influenza A by PCR: NEGATIVE
Influenza B by PCR: NEGATIVE
Resp Syncytial Virus by PCR: NEGATIVE
SARS Coronavirus 2 by RT PCR: NEGATIVE

## 2024-01-04 LAB — CBC WITH DIFFERENTIAL/PLATELET
Abs Immature Granulocytes: 0 10*3/uL (ref 0.00–0.07)
Band Neutrophils: 2 %
Basophils Absolute: 0.1 10*3/uL (ref 0.0–0.1)
Basophils Relative: 1 %
Eosinophils Absolute: 0.6 10*3/uL — ABNORMAL HIGH (ref 0.0–0.5)
Eosinophils Relative: 8 %
HCT: 30.5 % — ABNORMAL LOW (ref 36.0–46.0)
Hemoglobin: 9.7 g/dL — ABNORMAL LOW (ref 12.0–15.0)
Lymphocytes Relative: 19 %
Lymphs Abs: 1.4 10*3/uL (ref 0.7–4.0)
MCH: 29.6 pg (ref 26.0–34.0)
MCHC: 31.8 g/dL (ref 30.0–36.0)
MCV: 93 fL (ref 80.0–100.0)
Monocytes Absolute: 0.1 10*3/uL (ref 0.1–1.0)
Monocytes Relative: 2 %
Neutro Abs: 5.2 10*3/uL (ref 1.7–7.7)
Neutrophils Relative %: 68 %
Platelets: 184 10*3/uL (ref 150–400)
RBC: 3.28 MIL/uL — ABNORMAL LOW (ref 3.87–5.11)
RDW: 13.7 % (ref 11.5–15.5)
Smear Review: ADEQUATE
WBC: 7.4 10*3/uL (ref 4.0–10.5)
nRBC: 0 % (ref 0.0–0.2)

## 2024-01-04 LAB — PROTIME-INR
INR: 1.6 — ABNORMAL HIGH (ref 0.8–1.2)
Prothrombin Time: 19.1 s — ABNORMAL HIGH (ref 11.4–15.2)

## 2024-01-04 LAB — HEPATITIS PANEL, ACUTE
HCV Ab: NONREACTIVE
Hep A IgM: NONREACTIVE
Hep B C IgM: NONREACTIVE
Hepatitis B Surface Ag: NONREACTIVE

## 2024-01-04 LAB — AMMONIA: Ammonia: 11 umol/L (ref 9–35)

## 2024-01-04 LAB — ETHANOL: Alcohol, Ethyl (B): <10 mg/dL (ref <10)

## 2024-01-04 LAB — APTT: aPTT: 37 s — ABNORMAL HIGH (ref 24–36)

## 2024-01-04 MED ORDER — ENSURE ENLIVE PO LIQD
237.0000 mL | Freq: Two times a day (BID) | ORAL | Status: DC
  Administered 2024-01-05 – 2024-01-10 (×12): 237 mL via ORAL
  Filled 2024-01-04 (×4): qty 237

## 2024-01-04 MED ORDER — ONDANSETRON HCL 4 MG/2ML IJ SOLN: 4.0000 mg | Freq: Four times a day (QID) | INTRAMUSCULAR | Status: DC | PRN

## 2024-01-04 MED ORDER — HEPARIN SODIUM (PORCINE) 5000 UNIT/ML IJ SOLN: 5000.0000 [IU] | Freq: Three times a day (TID) | INTRAMUSCULAR | Status: DC

## 2024-01-04 MED ORDER — ONDANSETRON HCL 4 MG PO TABS: 4.0000 mg | ORAL_TABLET | Freq: Four times a day (QID) | ORAL | Status: DC | PRN

## 2024-01-04 MED ORDER — LACTATED RINGERS IV BOLUS
500.0000 mL | Freq: Once | INTRAVENOUS | Status: AC
Start: 2024-01-04 — End: 2024-01-04
  Administered 2024-01-04: 500 mL via INTRAVENOUS

## 2024-01-04 NOTE — H&P (Signed)
 History and Physical    Patient: [name] ZOX:[id number] DOB: 1966-03-31 DOA: 01/04/2024 DOS: the patient was seen and examined on 01/05/2024 PCP: Christene Lye, FNP  Patient coming from: ALF [facility])  Chief Complaint:  Chief Complaint  Patient presents with   Weakness   HPI: [name] is a 58 y.o. 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  She denies chest pain, shortness of breath, nausea, vomiting.   ED Course:  In the emergency department, patient was hemodynamically stable.  Workup in the ED showed normocytic anemia, BMP was normal except for bicarb of 19 and BUN/creatinine of 72/4.91, albumin 3.0, AST 4,879, ALT with 3,761, ALP 139, eGFR 10.  Troponin 48 > 46, alcohol level was less than 10, ammonia 11, alcohol level was less than 10.  Influenza A, B, SARS coronavirus 2, RSV was negative Ultrasound of right upper quadrant showed probable  tumefactive sludge in the gallbladder. No shadowing stone or sonographic evidence of acute cholecystitis. IV LR 500 mL was given.  Gastroenterologist was consulted and will follow-up with patient in the morning Patient was yet to have dialysis catheter in place for future dialysis.  Nephrology was consulted based on current patient's kidney status and will follow-up with patient in the morning. Hospitalist was asked to admit patient for further evaluation and management.  Review of Systems: Review of systems as noted in the HPI. All other systems reviewed and are negative.   Past Medical History:  Diagnosis Date   Acute lower UTI 06/04/2018   Acute metabolic encephalopathy 03/11/2021   Acute on chronic respiratory failure with hypoxia (HCC)  07/20/2021   Acute respiratory failure requiring reintubation (HCC) 11/09/2012   Acute respiratory failure with hypoxia (HCC) 12/03/2020   AKI (acute kidney injury) (HCC) 06/04/2018   Alcohol use    Ankle fracture, lateral malleolus, closed 2013   Anxiety    Aortic atherosclerosis (HCC) 03/10/2021   Atherosclerosis of native artery of both legs with gangrene (HCC)    Breast mass, left 2013   CHF (congestive heart failure) (HCC)    a. EF 20-25% by echo in 05/2016 with cath showing normal cors b. EF 50-55% in 07/2020 c. 01/2021: EF at 55-60% with moderate LVH   Chronic anemia    CKD (chronic kidney disease), stage III (HCC)    Cocaine abuse (HCC)    COPD (chronic obstructive pulmonary disease) (HCC)    Diabetes mellitus, type 2 (HCC)    Diabetic Charcot foot (HCC)    DKA (diabetic ketoacidosis) (HCC) 10/29/2020   Essential hypertension    H/O open hand wound 11/22/2018   History of cardiomyopathy    History of GI bleed 2011   Hyperlipidemia    Lactic acid acidosis 09/11/2014   Metabolic encephalopathy 03/11/2021   Noncompliance    Obesity    Oral candidiasis 03/10/2021   PAF (paroxysmal atrial fibrillation) (HCC)    Panic attacks    PAT (paroxysmal atrial tachycardia) (HCC)    Previously on Amiodarone   Pneumonia due to COVID-19 virus 12/03/2020   Seizures (HCC) 10/01/2020   Sepsis due to skin infection (HCC) 04/01/2021   Shock (HCC) 10/29/2020   Sleep apnea    Not on CPAP   Stroke (HCC) 2018   Thrombocytosis 08/02/2017  Tobacco abuse    UGI bleed    Urinary incontinence    Past Surgical History:  Procedure Laterality Date   ANGIOPLASTY ILLIAC ARTERY Right 04/23/2021   Procedure: ANGIOPLASTY AND STENT SUPERFICIAL FEMORAL ARTERY;  Surgeon: Leonie Douglas, MD;  Location[facility] OR;  Service: Vascular;  Laterality: Right;   AORTOGRAM  04/23/2021   Procedure: AORTOGRAM;  Surgeon: Leonie Douglas, MD;  Location: [facility] OR;  Service: Vascular;;   AORTOGRAM Left 04/30/2021    Procedure: left lower extremity angiogram with second order cannulation;  Surgeon: Leonie Douglas, MD;  Location: [facility] OR;  Service: Vascular;  Laterality: Left;   BIOPSY  11/12/2020   Procedure: BIOPSY;  Surgeon: Dolores Frame, MD;  Location: [facility] ENDO SUITE;  Service: Gastroenterology;;   BREAST BIOPSY     CARDIAC CATHETERIZATION N/A 07/28/2016   Procedure: Left Heart Cath and Coronary Angiography;  Surgeon: Corky Crafts, MD;  Location: [facility] INVASIVE CV LAB;  Service: Cardiovascular;  Laterality: N/A;   COLONOSCOPY N/A 05/10/2019   Procedure: COLONOSCOPY;  Surgeon: West Bali, MD;  Location: [facility] ENDO SUITE;  Service: Endoscopy;  Laterality: N/A;  Phenergan 12.5 mg IV in pre-op   DILATION AND CURETTAGE OF UTERUS     ESOPHAGOGASTRODUODENOSCOPY (EGD) WITH PROPOFOL N/A 11/12/2020   Procedure: ESOPHAGOGASTRODUODENOSCOPY (EGD) WITH PROPOFOL;  Surgeon: Dolores Frame, MD;  Location: [facility] ENDO [facility]ITE;  Service: Gastroenterology;  Laterality: N/A;   I & D EXTREMITY Bilateral 09/22/2017   Procedure: BILATERAL DEBRIDEMENT LEG/FOOT ULCERS, APPLY VERAFLO WOUND VAC;  Surgeon: Nadara Mustard, MD;  Location: [facility] OR;  Service: Orthopedics;  Laterality: Bilateral;   I & D EXTREMITY Right 10/11/2018   Procedure: IRRIGATION AND DEBRIDEMENT RIGHT HAND;  Surgeon: Dominica Severin, MD;  Location: [facility] OR;  Service: Orthopedics;  Laterality: Right;   I & D EXTREMITY Right 10/13/2018   Procedure: REPEAT IRRIGATION AND DEBRIDEMENT RIGHT HAND;  Surgeon: Dominica Severin, MD;  Location: [facility] OR;  Service: Orthopedics;  Laterality: Right;   I & D EXTREMITY Right 11/22/2018   Procedure: IRRIGATION AND DEBRIDEMENT AND PINNING RIGHT HAND;  Surgeon: Dominica Severin, MD;  Location: [facility] OR;  Service: Orthopedics;  Laterality: Right;   IR RADIOLOGIST EVAL & MGMT  07/05/2018   LOWER EXTREMITY ANGIOGRAM Bilateral 04/23/2021   Procedure: RIGHT  AND LEFT LOWER EXTREMITY ANGIOGRAM;  Surgeon: Leonie Douglas, MD;   Location: [facility] OR;  Service: Vascular;  Laterality: Bilateral;   POLYPECTOMY  05/10/2019   Procedure: POLYPECTOMY;  Surgeon: West Bali, MD;  [facility];  Service: Endoscopy;;   SKIN SPLIT GRAFT Bilateral 09/28/2017   Procedure: BILATERAL SPLIT THICKNESS SKIN GRAFT LEGS/FEET AND APPLY VAC;  Surgeon: Nadara Mustard, MD;  Location: [facility] OR;  Service: Orthopedics;  Laterality: Bilateral;   SKIN SPLIT GRAFT Right 11/22/2018   Procedure: SKIN GRAFT SPLIT THICKNESS;  Surgeon: Dominica Severin, MD;  Location: [facility] OR;  Service: Orthopedics;  Laterality: Right;    Social History:  reports that she quit smoking about 6 years ago. Her smoking use included cigarettes. She started smoking about 26 years ago. She has a 5 pack-year smoking history. She has never been exposed to tobacco smoke. She has never used smokeless tobacco. She reports that she does not currently use alcohol. She reports that she does not currently use drugs after having used the following drugs: Marijuana and Cocaine. Frequency: 3.00 times per week.   Allergies  Allergen Reactions   Sodium Hypochlorite Anaphylaxis    (  Bleach wipes) "I am not able to breathe"    Family History  Problem Relation Age of Onset   Hypertension Mother    Heart attack Mother    Hypertension Father        CABG    Hypertension Sister    Hypertension Brother    Hypertension Sister    Cancer Sister        breast    Arthritis Other    Cancer Other    Diabetes Other    Asthma Other    Hypertension Daughter    Hypertension Son      Prior to Admission medications   Medication Sig Start Date End Date Taking? Authorizing Provider  ACCU-CHEK GUIDE TEST test strip USE TO CHECK BLOOD SUGAR WITH MEALS AND AT BEDTIME. 09/27/23   Billie Lade, MD  acetaminophen (TYLENOL) 325 MG tablet Take 2 tablets (650 mg total) by mouth every 6 (six) hours as needed for headache, fever or mild pain. 11/04/20   Johnson, Clanford L, MD  ammonium lactate  (AMLACTIN) 12 % lotion Apply 1 Application topically as needed for dry skin. 01/05/23   Del Nigel Berthold, FNP  apixaban (ELIQUIS) 5 MG TABS tablet Take 1 tablet (5 mg total) by mouth 2 (two) times daily. 09/20/23   Bethann Berkshire, MD  ARIPiprazole (ABILIFY) 5 MG tablet Take 1 tablet (5 mg total) by mouth daily. 09/20/23   Bethann Berkshire, MD  ascorbic acid (VITAMIN C) 500 MG tablet TAKE (1) TABLET BY MOUTH TWICE DAILY. 06/30/23   Billie Lade, MD  aspirin 81 MG chewable tablet Chew 1 tablet (81 mg total) by mouth daily. 09/20/23   Bethann Berkshire, MD  azelastine (OPTIVAR) 0.05 % ophthalmic solution Place 1 drop into both eyes 2 (two) times daily. 11/25/23   [provider]  calcitRIOL (ROCALTROL) 0.25 MCG capsule Take 1 capsule (0.25 mcg total) by mouth daily. 09/20/23   Bethann Berkshire, MD  FLUoxetine (PROZAC) 40 MG capsule Take 2 pills once a day 09/20/23   Bethann Berkshire, MD  fluticasone [facility]) 50 MCG/ACT nasal spray Place 2 sprays into both nostrils daily. 05/03/23   Gilmore Laroche, FNP  furosemide (LASIX) 40 MG tablet Take 40 mg by mouth 2 (two) times daily. 12/02/23   [provider]  HUMALOG KWIKPEN 100 UNIT/ML KwikPen Inject 1-10 Units into the skin See admin instructions. Inject 1-10 units into the skin before meals and at bedtime, PER SLIDING SCALE: BGL 151-200 = 1 unit; 201-250 = 2 units; 251-300 = 4 units; 301-350 = 6 units; 351-400 = 8 units; 401-450 = 10 units 03/28/21   [provider]  levETIRAcetam (KEPPRA) 500 MG tablet Take 1 tablet (500 mg total) by mouth 2 (two) times daily. 09/20/23   Bethann Berkshire, MD  loperamide (IMODIUM) 2 MG capsule Take 4 mg by mouth every 4 (four) hours as needed for diarrhea or loose stools. 07/09/23   [provider]  LORazepam (ATIVAN) 0.5 MG tablet TAKE (1) TABLET BY MOUTH TWICE DAILY. 06/30/23   Anabel Halon, MD  metoprolol succinate (TOPROL-XL) 25 MG 24 hr tablet TAKE (1) TABLET BY MOUTH IN THE MORNING. 06/30/23    Billie Lade, MD  Multiple Vitamins-Minerals (THERA-M) TABS TAKE (1) TABLET BY MOUTH ONCE DAILY. 06/30/23   Billie Lade, MD  NIFEdipine (PROCARDIA XL/NIFEDICAL XL) 60 MG 24 hr tablet Take 60 mg by mouth 2 (two) times daily. 09/14/23   [provider]  olopatadine (PATADAY) 0.1 % ophthalmic  solution Place 1 drop into both eyes 2 (two) times daily.    [provider]  ondansetron (ZOFRAN-ODT) 4 MG disintegrating tablet Take 4 mg by mouth every 8 (eight) hours as needed for nausea or vomiting. 11/04/23   [provider]  oxybutynin (DITROPAN-XL) 5 MG 24 hr tablet TAKE (1) TABLET BY MOUTH ONCE DAILY. Patient taking differently: Take 5 mg by mouth at bedtime. 06/30/23   Billie Lade, MD  rosuvastatin (CRESTOR) 10 MG tablet Take 10 mg by mouth at bedtime. 08/31/23   [provider]  sodium bicarbonate 650 MG tablet Take 650 mg by mouth 3 (three) times daily. 07/12/23   [provider]  thiamine (VITAMIN B1) 100 MG tablet TAKE (1) TABLET BY MOUTH ONCE DAILY. 06/30/23   Billie Lade, MD  traMADol (ULTRAM) 50 MG tablet Take 0.5 tablets by mouth every 6 (six) hours as needed for moderate pain or severe pain.    [provider]  albuterol (PROAIR HFA) 108 (90 Base) MCG/ACT inhaler INHALE 2 PUFFS EVERY 6 HOURS AS NEEDED FOR SHORTNESS OF BREATH/WHEEZING. Patient not taking: Reported on 04/21/2021 08/05/20 04/21/21  Kerri Perches, MD    Physical Exam: BP 119/73   Pulse (!) 101   Temp 98.4 F (36.9 C) (Oral)   Resp 20   Ht 5\' 1"  (1.549 m)   Wt 80 kg   LMP 05/19/2016   SpO2 91%   BMI 33.32 kg/m   General: 58 y.o. year-old female well developed well nourished in no acute distress.  Alert and oriented x3. HEENT: NCAT, EOMI Neck: Supple, trachea medial Cardiovascular: Regular rate and rhythm with no rubs or gallops.  No thyromegaly or JVD noted.  No lower extremity edema. 2/4 pulses in all 4 extremities. Respiratory: Clear to auscultation  with no wheezes or rales. Good inspiratory effort. Abdomen: Soft, nontender nondistended with normal bowel sounds x4 quadrants. Muskuloskeletal: No cyanosis, clubbing or edema noted bilaterally Neuro: CN II-XII intact, strength 5/5 x 4, sensation, reflexes intact Skin: No ulcerative lesions noted or rashes Psychiatry: Judgement and insight appear normal. Mood is appropriate for condition and setting          Labs on Admission:  Basic Metabolic Panel: Recent Labs  Lab 01/04/24 1524 01/05/24 0309  NA 138 139  K 3.8 3.5  CL 105 108  CO2 19* 21*  GLUCOSE 94 137*  BUN 72* 74*  CREATININE 4.91* 4.57*  CALCIUM 9.0 8.7*  MG  --  1.9  PHOS  --  4.6   Liver Function Tests: Recent Labs  Lab 01/04/24 1524 01/05/24 0309  AST 4,879* 3,883*  ALT 3,761* 3,712*  ALKPHOS 139* 146*  BILITOT 1.0 1.0  PROT 6.7 6.1*  ALBUMIN 3.0* 2.8*   No results for input(s): "LIPASE", "AMYLASE" in the last 168 hours. Recent Labs  Lab 01/04/24 1816  AMMONIA 11   CBC: Recent Labs  Lab 01/04/24 1524 01/05/24 0309  WBC 7.4 6.2  NEUTROABS 5.2  --   HGB 9.7* 8.9*  HCT 30.5* 27.7*  MCV 93.0 93.6  PLT 184 171   Cardiac Enzymes: No results for input(s): "CKTOTAL", "CKMB", "CKMBINDEX", "TROPONINI" in the last 168 hours.  BNP (last 3 results) No results for input(s): "BNP" in the last 8760 hours.  ProBNP (last 3 results) No results for input(s): "PROBNP" in the last 8760 hours.  CBG: No results for input(s): "GLUCAP" in the last 168 hours.  Radiological Exams on Admission: US Abdomen Limited RUQ (LIVER/GB) Result  Date: 01/04/2024 CLINICAL DATA:  Nausea and abdominal pain. EXAM: ULTRASOUND ABDOMEN LIMITED RIGHT UPPER QUADRANT COMPARISON:  None Available. FINDINGS: Gallbladder: There is nonshadowing echogenic content within the gallbladder which may represent tumefactive sludge. No shadowing stone. There is no gallbladder wall thickening or pericholecystic fluid. Negative sonographic Murphy's  sign. Common bile duct: Diameter: 14 mm Liver: There is diffuse increased liver echogenicity most commonly seen in the setting of fatty infiltration. Superimposed inflammation or fibrosis is not excluded. Clinical correlation is recommended. Portal vein is patent on color Doppler imaging with normal direction of blood flow towards the liver. Other: Increased right renal echogenicity. IMPRESSION: 1. Probable tumefactive sludge in the gallbladder. No shadowing stone or sonographic evidence of acute cholecystitis. 2. Fatty liver. 3. Echogenic right kidney suggestive of medical renal disease. Clinical correlation is recommended. Electronically Signed   By: Elgie Collard M.D.   On: 01/04/2024 18:56   DG Chest Port 1 View Result Date: 01/04/2024 CLINICAL DATA:  Weakness. EXAM: PORTABLE CHEST 1 VIEW COMPARISON:  Chest radiograph dated 07/20/2021. FINDINGS: Shallow inspiration. No focal consolidation, pleural effusion, or pneumothorax. Mild cardiomegaly. No acute osseous pathology. IMPRESSION: 1. No active disease. 2. Mild cardiomegaly. Electronically Signed   By: Elgie Collard M.D.   On: 01/04/2024 18:16    EKG: I independently viewed the EKG done and my findings are as followed: Sinus arrhythmia at a rate of 82 bpm  Assessment/Plan Present on Admission:  Gallbladder sludge  Elevated troponin  PAF (paroxysmal atrial fibrillation) (HCC)  Essential hypertension  Mixed hyperlipidemia  Principal Problem:   Gallbladder sludge Active Problems:   Essential hypertension   Mixed hyperlipidemia   Elevated troponin   PAF (paroxysmal atrial fibrillation) (HCC)   Chronic kidney disease, stage V (HCC)   Hypoalbuminemia due to protein-calorie malnutrition (HCC)   Obesity, Class I, BMI 30-34.9   History of seizure   Transaminitis   Transaminitis in the setting of gallbladder sludge RUQ ultrasound was suggestive of gallbladder sludge AST 4,879, ALT 3,761, ALP 139 Hepatitis panel, CMV, EBV, HSV  pending Gastroenterology (Dr. Levon Hedger) was consulted and will follow-up with patient in the morning  Chronic kidney disease stage V BUN/creatinine 72/4.91 Patient already has dialysis catheter in place, however he has not started dialysis Nephrology (Dr. Glenna Fellows) was consulted and will follow-up with patient in the morning  Elevated troponin possibly secondary to type II demand ischemia Troponin 48 > 46; this has since flattened, she denies chest pain  Hypoalbuminemia possibly due to moderate protein calorie malnutrition Albumin 3.0, protein supplement will be provided  Class I obesity (BMI 33.32) Diet and lifestyle modification  Type 2 diabetes mellitus Hemoglobin A1c on 03/31/23 was 6.1 Diet and Lifestyle modification  Paroxysmal atrial fibrillation Continue Eliquis, Toprol-XL  History of seizure Continue Keppra   Essential hypertension Continue Toprol-XL  Mixed hyperlipidemia Continue Crestor  DVT prophylaxis: Eliquis  Code Status: Full code  Family Communication: None at bedside.  Consults: Gastroenterology, nephrology  Severity of Illness: The appropriate patient status for this patient is INPATIENT. Inpatient status is judged to be reasonable and necessary in order to provide the required intensity of service to ensure the patient's safety. The patient's presenting symptoms, physical exam findings, and initial radiographic and laboratory data in the context of their chronic comorbidities is felt to place them at high risk for further clinical deterioration. Furthermore, it is not anticipated that the patient will be medically stable for discharge from the hospital within 2 midnights of admission.   * I certify  that at the point of admission it is my clinical judgment that the patient will require inpatient hospital care spanning beyond 2 midnights from the point of admission due to high intensity of service, high risk for further deterioration and high frequency of  surveillance required.*  Author: Frankey Shown, DO 01/05/2024 6:41 AM  For on call review [email or web address].

## 2024-01-04 NOTE — ED Notes (Signed)
 Patient made aware of need for urine sample, Call bell within reach.

## 2024-01-04 NOTE — ED Triage Notes (Signed)
 Pt arrived via [facility] from [facility] in [place] c/o weakness X 5 days. Pt has dialysis catheter in place on admission for future dialysis appointments. Pt has not begun dialysis yet.

## 2024-01-04 NOTE — Progress Notes (Signed)
 SDW CALL  pre-op instructions given over the phone.to Med tech, Selena Batten, at [facility].  The opportunity was given to ask  questions. No further questions asked.  Written instructions faxed to [phone]. Any further questions,please call me, Harriett Sine at [phone] or [phone].   PCP -  Cardiologist -   PPM/ICD -  Device Orders -  Rep Notified -   Chest x-ray -  EKG -  Stress Test -  ECHO -  Cardiac Cath -   Sleep Study -  CPAP -   Fasting Blood Sugar -  Checks Blood Sugar _____ times a day  Blood Thinner Instructions: Aspirin Instructions:  ERAS Protcol - PRE-SURGERY Ensure or G2-   COVID TEST-    Anesthesia review:   Patient denies shortness of breath, fever, cough and chest pain over the phone call    Surgical Instructions    Your procedure is scheduled on March 12  Report to [facility] Main Entrance "A" at 8: A.M., then check in with the Admitting office.  Call this number if you have problems the morning of surgery:  [phone]    Remember:  Do not eat or drink anything after midnight the night before your surgery   Take these medicines the morning of surgery with A SIP OF WATER: Abilify,Aspirin,Optivar eye drops, Fluoxetine(Prozac),Flonase nasal spray, Levetiracetam(Kepra),Lorazepam,Metoprolol succinate, Nifidepine, Pataday eye drops.    As of today, STOP taking any  Aleve, Naproxen, Ibuprofen, Motrin, Advil, Goody's, BC's, all herbal medications, fish oil, and all vitamins.  WHAT DO I DO ABOUT MY DIABETES MEDICATION?  If your CBG is greater than 220 mg/dL, the morning of surgery, you may take  of your sliding scale (correction) dose of insulin.   How do I manage my blood sugar before surgery? Check your blood sugar at least 4 times a day, starting 2 days before surgery, to make sure that the level is not too high or low.  Check your blood sugar the morning of your surgery when you wake up and every 2 hours until you get to the Short Stay  unit.  If your blood sugar is less than 70 mg/dL, you will need to treat for low blood sugar: Do not take insulin. Treat a low blood sugar (less than 70 mg/dL) with  cup of clear juice (cranberry or apple), NO ORANGE JUICE,4 glucose tablets, OR glucose gel. Recheck blood sugar in 15 minutes after treatment (to make sure it is greater than 70 mg/dL). If your blood sugar is not greater than 70 mg/dL on recheck, call [phone] for further instructions. Report your blood sugar to the short stay nurse when you get to Short Stay. [facility] is not responsible for any belongings or valuables. .   Do NOT Smoke (Tobacco/Vaping)  24 hours prior to your procedure  If you use a CPAP at night, you may bring your mask for your overnight stay.   Contacts, glasses, hearing aids, dentures or partials may not be worn into surgery, please bring cases for these belongings   Patients discharged the day of surgery will not be allowed to drive home, and someone needs to stay with them for 24 hours.     Special instructions:    Oral Hygiene is also important to reduce your risk of infection.  Remember - BRUSH YOUR TEETH THE MORNING OF SURGERY WITH YOUR REGULAR TOOTHPASTE   Day of Surgery:  Take a shower the day of or night before with antibacterial soap. Wear Clean/Comfortable clothing  the morning of surgery Do not apply any deodorants/lotions.   Do not wear jewelry or makeup Do not wear lotions, powders, perfumes/colognes, or deodorant. Do not shave 48 hours prior to surgery.  Men may shave face and neck. Do not bring valuables to the hospital. Do not wear nail polish, gel polish, artificial nails, or any other type of covering on natural nails (fingers and toes) If you have artificial nails or gel coating that need to be removed by a nail salon, please have this removed prior to surgery. Artificial nails or gel coating may interfere with anesthesia's ability to adequately monitor your vital  signs. Remember to brush your teeth WITH YOUR REGULAR TOOTHPASTE.   pre-op instructions given over the phone.to Med tech, Selena Batten, at [facility].  The opportunity was given to ask  questions. No further questions asked.  Written instructions faxed to [phone]. Any further questions,please call me, Harriett Sine at [phone] or [phone].   The day of surgery please send a copy of the patient's most current medication administration record indicating the last time each medication was given. Thank you.

## 2024-01-04 NOTE — ED Provider Notes (Signed)
  EMERGENCY DEPARTMENT AT [facility] Provider Note   CSN: [id number] Arrival date & time: 01/04/24  1442     History  Chief Complaint  Patient presents with   Weakness    [name] is a 58 y.o. female.  Patient is a 58 year old female who presents to the emergency department with a chief complaint of generalized weakness with has been gradually become worse over the past 2 days.  Patient notes that she has very minimal p.o. intake over the past 2 days.  She does admit to some associated diarrhea.  She has had no associated nausea or vomiting.  She denies any chest pain or shortness of breath.  She denies any associated dizziness, lightheadedness or syncope.   Weakness      Home Medications Prior to Admission medications   Medication Sig Start Date End Date Taking? Authorizing Provider  ACCU-CHEK GUIDE TEST test strip USE TO CHECK BLOOD SUGAR WITH MEALS AND AT BEDTIME. 09/27/23   Billie Lade, MD  acetaminophen (TYLENOL) 325 MG tablet Take 2 tablets (650 mg total) by mouth every 6 (six) hours as needed for headache, fever or mild pain. 11/04/20   Johnson, Clanford L, MD  ammonium lactate (AMLACTIN) 12 % lotion Apply 1 Application topically as needed for dry skin. 01/05/23   Del Nigel Berthold, FNP  apixaban (ELIQUIS) 5 MG TABS tablet Take 1 tablet (5 mg total) by mouth 2 (two) times daily. 09/20/23   Bethann Berkshire, MD  ARIPiprazole (ABILIFY) 5 MG tablet Take 1 tablet (5 mg total) by mouth daily. 09/20/23   Bethann Berkshire, MD  ascorbic acid (VITAMIN C) 500 MG tablet TAKE (1) TABLET BY MOUTH TWICE DAILY. 06/30/23   Billie Lade, MD  aspirin 81 MG chewable tablet Chew 1 tablet (81 mg total) by mouth daily. 09/20/23   Bethann Berkshire, MD  azelastine (OPTIVAR) 0.05 % ophthalmic solution Place 1 drop into both eyes 2 (two) times daily. 11/25/23   [provider]  calcitRIOL (ROCALTROL) 0.25 MCG capsule Take 1 capsule (0.25 mcg total) by mouth  daily. 09/20/23   Bethann Berkshire, MD  FLUoxetine (PROZAC) 40 MG capsule Take 2 pills once a day 09/20/23   Bethann Berkshire, MD  fluticasone [facility]) 50 MCG/ACT nasal spray Place 2 sprays into both nostrils daily. 05/03/23   Gilmore Laroche, FNP  furosemide (LASIX) 40 MG tablet Take 40 mg by mouth 2 (two) times daily. 12/02/23   [provider]  HUMALOG KWIKPEN 100 UNIT/ML KwikPen Inject 1-10 Units into the skin See admin instructions. Inject 1-10 units into the skin before meals and at bedtime, PER SLIDING SCALE: BGL 151-200 = 1 unit; 201-250 = 2 units; 251-300 = 4 units; 301-350 = 6 units; 351-400 = 8 units; 401-450 = 10 units 03/28/21   [provider]  levETIRAcetam (KEPPRA) 500 MG tablet Take 1 tablet (500 mg total) by mouth 2 (two) times daily. 09/20/23   Bethann Berkshire, MD  loperamide (IMODIUM) 2 MG capsule Take 4 mg by mouth every 4 (four) hours as needed for diarrhea or loose stools. 07/09/23   [provider]  LORazepam (ATIVAN) 0.5 MG tablet TAKE (1) TABLET BY MOUTH TWICE DAILY. 06/30/23   Anabel Halon, MD  metoprolol succinate (TOPROL-XL) 25 MG 24 hr tablet TAKE (1) TABLET BY MOUTH IN THE MORNING. 06/30/23   Billie Lade, MD  Multiple Vitamins-Minerals (THERA-M) TABS TAKE (1) TABLET BY MOUTH ONCE DAILY. 06/30/23   Billie Lade,  MD  NIFEdipine (PROCARDIA XL/NIFEDICAL XL) 60 MG 24 hr tablet Take 60 mg by mouth 2 (two) times daily. 09/14/23   [provider]  olopatadine (PATADAY) 0.1 % ophthalmic solution Place 1 drop into both eyes 2 (two) times daily.    [provider]  ondansetron (ZOFRAN-ODT) 4 MG disintegrating tablet Take 4 mg by mouth every 8 (eight) hours as needed for nausea or vomiting. 11/04/23   [provider]  oxybutynin (DITROPAN-XL) 5 MG 24 hr tablet TAKE (1) TABLET BY MOUTH ONCE DAILY. Patient taking differently: Take 5 mg by mouth at bedtime. 06/30/23   Billie Lade, MD  rosuvastatin (CRESTOR) 10 MG tablet Take 10 mg by  mouth at bedtime. 08/31/23   [provider]  sodium bicarbonate 650 MG tablet Take 650 mg by mouth 3 (three) times daily. 07/12/23   [provider]  thiamine (VITAMIN B1) 100 MG tablet TAKE (1) TABLET BY MOUTH ONCE DAILY. 06/30/23   Billie Lade, MD  traMADol (ULTRAM) 50 MG tablet Take 0.5 tablets by mouth every 6 (six) hours as needed for moderate pain or severe pain.    [provider]  albuterol (PROAIR HFA) 108 (90 Base) MCG/ACT inhaler INHALE 2 PUFFS EVERY 6 HOURS AS NEEDED FOR SHORTNESS OF BREATH/WHEEZING. Patient not taking: Reported on 04/21/2021 08/05/20 04/21/21  Kerri Perches, MD      Allergies    Sodium hypochlorite    Review of Systems   Review of Systems  Neurological:  Positive for weakness.  All other systems reviewed and are negative.   Physical Exam Updated Vital Signs BP 123/84   Pulse 84   Temp 98.5 F (36.9 C) (Oral)   Resp 17   Ht 5\' 1"  (1.549 m)   Wt 80 kg   LMP 05/19/2016   SpO2 96%   BMI 33.32 kg/m  Physical Exam Vitals and nursing note reviewed.  Constitutional:      Appearance: Normal appearance.  HENT:     Head: Normocephalic and atraumatic.     Nose: Nose normal.     Mouth/Throat:     Mouth: Mucous membranes are moist.  Eyes:     Extraocular Movements: Extraocular movements intact.     Conjunctiva/sclera: Conjunctivae normal.     Pupils: Pupils are equal, round, and reactive to light.  Cardiovascular:     Rate and Rhythm: Normal rate and regular rhythm.     Pulses: Normal pulses.     Heart sounds: Normal heart sounds. No murmur heard. Pulmonary:     Effort: Pulmonary effort is normal. No respiratory distress.     Breath sounds: Normal breath sounds. No stridor. No wheezing, rhonchi or rales.  Abdominal:     General: Abdomen is flat. Bowel sounds are normal. There is no distension.     Palpations: Abdomen is soft.     Tenderness: There is no abdominal tenderness. There is no guarding.  Musculoskeletal:         General: Normal range of motion.     Cervical back: Normal range of motion and neck supple.     Right lower leg: No edema.     Left lower leg: No edema.  Skin:    General: Skin is warm and dry.     Findings: No erythema or rash.  Neurological:     General: No focal deficit present.     Mental Status: She is alert and oriented to person, place, and time. Mental status is at baseline.  Psychiatric:        Mood and Affect: Mood normal.        Behavior: Behavior normal.        Thought Content: Thought content normal.        Judgment: Judgment normal.     ED Results / Procedures / Treatments   Labs (all labs ordered are listed, but only abnormal results are displayed) Labs Reviewed  CBC WITH DIFFERENTIAL/PLATELET - Abnormal; Notable for the following components:      Result Value   RBC 3.28 (*)    Hemoglobin 9.7 (*)    HCT 30.5 (*)    All other components within normal limits  RESP PANEL BY RT-PCR (RSV, FLU A&B, COVID)  RVPGX2  COMPREHENSIVE METABOLIC PANEL  URINALYSIS, ROUTINE W REFLEX MICROSCOPIC  TROPONIN I (HIGH SENSITIVITY)    EKG None  Radiology No results found.  Procedures Procedures    Medications Ordered in ED Medications - No data to display  ED Course/ Medical Decision Making/ A&P                                 Medical Decision Making Amount and/or Complexity of Data Reviewed Labs: ordered. Radiology: ordered.  Risk Decision regarding hospitalization.   This patient presents to the ED for concern of generalized weakness, this involves an extensive number of treatment options, and is a complaint that carries with it a high risk of complications and morbidity.  The differential diagnosis includes sepsis, acute viral syndrome, ACS, pulmonary embolus, pneumonia, electrolyte derangement, acute on chronic kidney failure, hepatic failure, urinary tract infection   Co morbidities that complicate the patient evaluation  Chronic kidney  disease   Additional history obtained:  Additional history obtained from records External records from outside source obtained and reviewed including none   Lab Tests:  I Ordered, and personally interpreted labs.  The pertinent results include: Elevated creatinine, elevated troponin, elevated liver enzymes, anemia   Imaging Studies ordered:  I ordered imaging studies including ultrasound of right upper quadrant, chest x-ray I independently visualized and interpreted imaging which showed gallbladder sludge without acute cholecystitis, fatty liver, no acute cardiopulmonary process I agree with the radiologist interpretation   Cardiac Monitoring: / EKG:  The patient was maintained on a cardiac monitor.  I personally viewed and interpreted the cardiac monitored which showed an underlying rhythm of: Sinus arrhythmia, no ST/T wave changes, no ischemic changes, no STEMI, consistent with previous   Consultations Obtained:  I requested consultation with the gastroenterology, nephrology, hospitalist,  and discussed lab and imaging findings as well as pertinent plan - they recommend: Admission   Problem List / ED Course / Critical interventions / Medication management  Patient does remain stable at this time.  Discussed with patient we will plan for admission to the hospital service.  Did discuss patient case with Dr. Levon Hedger with gastroenterology who notes that he will see the patient in consult tomorrow.  He did recommend placing lab testing for CMV, EBV, HSV.  Also discussed patient case with Dr. Glenna Fellows with nephrology.  Noted that the patient does not require emergent dialysis at this time and she will see the patient in consult as well.  Patient did have negative Tylenol levels in the emergency department.  Coags were elevated but patient is also on Eliquis.  Right upper quadrant ultrasound demonstrated no acute surgical pathology.  Did discuss patient case with Dr. Thomes Dinning with the  hospitalist service who has excepted at this time. I ordered medication including IV fluids for acute on chronic renal failure Reevaluation of the patient after these medicines showed that the patient improved I have reviewed the patients home medicines and have made adjustments as needed   Social Determinants of Health:  None   Test / Admission - Considered:  Admission        Final Clinical Impression(s) / ED Diagnoses Final diagnoses:  None    Rx / DC Orders ED Discharge Orders     None         Kathlen Mody 01/04/24 2142    Derwood Kaplan, MD 01/06/24 0006

## 2024-01-04 NOTE — Progress Notes (Signed)
 Anesthesia Chart Review: Same-day workup  58 year old female with pertinent history including HTN, afib on Eliquis, combined heart failure (EF 50 to 55% by echo 06/2021), PVD, COPD, IDDM 2, OSA not on CPAP, reported history of respiratory failure with supplemental oxygen use but not currently, seizure disorder, CKD 5 not yet on HD, HLD, MDD.  She resides at [facility] assisted living facility.  Per neurology notes, she has a history of seizure event in October 2021 in the setting of diabetic ketoacidosis.  Denies seizures since that time.  She is maintained on Keppra.  Vascular surgery instructed patient to take last dose of Eliquis 01/01/2024.  Patient will need day of surgery labs and evaluation.  EKG 09/07/2022: Sinus tachycardia with Premature atrial complexes.  Rate 115. Left axis deviation. Possible Anterior infarct , age undetermined  TTE 07/21/2021: 1. Left ventricular ejection fraction, by estimation, is 50 to 55%. The  left ventricle has low normal function. The left ventricle has no regional  wall motion abnormalities. There is mild left ventricular hypertrophy.  Left ventricular diastolic  parameters are consistent with Grade I diastolic dysfunction (impaired  relaxation).   2. Right ventricular systolic function is normal. The right ventricular  size is normal. There is mildly elevated pulmonary artery systolic  pressure.   3. Left atrial size was moderately dilated.   4. The mitral valve is grossly normal. No evidence of mitral valve  regurgitation.   5. The aortic valve is tricuspid. Aortic valve regurgitation is not  visualized.   6. The inferior vena cava is normal in size with greater than 50%  respiratory variability, suggesting right atrial pressure of 3 mmHg.     Zannie Cove [facility] Short Stay Center/Anesthesiology Phone [phone] 01/04/2024 12:09 PM

## 2024-01-04 NOTE — Progress Notes (Signed)
 Spoke to [place] in Dr. Verita Lamb office. Informed her that patient is the [facility] ER. Marchelle Folks stated she will let Dr. Lenell Antu Know.

## 2024-01-04 NOTE — ED Notes (Signed)
 ED Provider at bedside.

## 2024-01-04 NOTE — Anesthesia Preprocedure Evaluation (Signed)
 Anesthesia Evaluation    Airway        Dental   Pulmonary Patient abstained from smoking., former smoker          Cardiovascular hypertension,      Neuro/Psych    GI/Hepatic   Endo/Other  diabetes    Renal/GU      Musculoskeletal   Abdominal   Peds  Hematology   Anesthesia Other Findings   Reproductive/Obstetrics                              Anesthesia Physical Anesthesia Plan  ASA:   Anesthesia Plan:    Post-op Pain Management:    Induction:   PONV Risk Score and Plan:   Airway Management Planned:   Additional Equipment:   Intra-op Plan:   Post-operative Plan:   Informed Consent:   Plan Discussed with:   Anesthesia Plan Comments: (PAT note by Antionette Poles, PA-C:  58 year old female with pertinent history including HTN, afib on Eliquis, combined heart failure (EF 50 to 55% by echo 06/2021), PVD, COPD, IDDM 2, OSA not on CPAP, reported history of respiratory failure with supplemental oxygen use but not currently, seizure disorder, CKD 5 not yet on HD, HLD, MDD.  She resides at [facility] assisted living facility.  Per neurology notes, she has a history of seizure event in October 2021 in the setting of diabetic ketoacidosis.  Denies seizures since that time.  She is maintained on Keppra.  Vascular surgery instructed patient to take last dose of Eliquis 01/01/2024.  Patient will need day of surgery labs and evaluation.  EKG 09/07/2022: Sinus tachycardia with Premature atrial complexes.  Rate 115. Left axis deviation. Possible Anterior infarct , age undetermined  TTE 07/21/2021: 1. Left ventricular ejection fraction, by estimation, is 50 to 55%. The  left ventricle has low normal function. The left ventricle has no regional  wall motion abnormalities. There is mild left ventricular hypertrophy.  Left ventricular diastolic  parameters are consistent with Grade I diastolic  dysfunction (impaired  relaxation).   2. Right ventricular systolic function is normal. The right ventricular  size is normal. There is mildly elevated pulmonary artery systolic  pressure.   3. Left atrial size was moderately dilated.   4. The mitral valve is grossly normal. No evidence of mitral valve  regurgitation.   5. The aortic valve is tricuspid. Aortic valve regurgitation is not  visualized.   6. The inferior vena cava is normal in size with greater than 50%  respiratory variability, suggesting right atrial pressure of 3 mmHg.   )         Anesthesia Quick Evaluation

## 2024-01-04 NOTE — Progress Notes (Signed)
 Surgical Instructions                 Your procedure is scheduled on March 12             Report to [facility] Main Entrance "A" at 8: A.M., then check in with the Admitting office.             Call this number if you have problems the morning of surgery:             [phone]                 Remember:             Do not eat or drink anything after midnight the night before your surgery               Take these medicines the morning of surgery with A SIP OF WATER: Abilify,Aspirin,Optivar eye drops, Fluoxetine(Prozac),Flonase nasal spray, Levetiracetam(Kepra),Lorazepam,Metoprolol succinate, Nifidepine, Pataday eye drops.      As of today, STOP taking any  Aleve, Naproxen, Ibuprofen, Motrin, Advil, Goody's, BC's, all herbal medications, fish oil, and all vitamins.   WHAT DO I DO ABOUT MY DIABETES MEDICATION?   If your CBG is greater than 220 mg/dL, the morning of surgery, you may take  of your sliding scale (correction) dose of insulin.     How do I manage my blood sugar before surgery? Check your blood sugar at least 4 times a day, starting 2 days before surgery, to make sure that the level is not too high or low.   Check your blood sugar the morning of your surgery when you wake up and every 2 hours until you get to the Short Stay unit.   If your blood sugar is less than 70 mg/dL, you will need to treat for low blood sugar: Do not take insulin. Treat a low blood sugar (less than 70 mg/dL) with  cup of clear juice (cranberry or apple), NO ORANGE JUICE,4 glucose tablets, OR glucose gel. Recheck blood sugar in 15 minutes after treatment (to make sure it is greater than 70 mg/dL). If your blood sugar is not greater than 70 mg/dL on recheck, call [phone] for further instructions. Report your blood sugar to the short stay nurse when you get to Short Stay. [facility] is not responsible for any belongings or valuables. .    Do NOT Smoke (Tobacco/Vaping)  24 hours prior to  your procedure   If you use a CPAP at night, you may bring your mask for your overnight stay.   Contacts, glasses, hearing aids, dentures or partials may not be worn into surgery, please bring cases for these belongings   Patients discharged the day of surgery will not be allowed to drive home, and someone needs to stay with them for 24 hours.         Special instructions:     Oral Hygiene is also important to reduce your risk of infection.  Remember - BRUSH YOUR TEETH THE MORNING OF SURGERY WITH YOUR REGULAR TOOTHPASTE     Day of Surgery:   Take a shower the day of or night before with antibacterial soap. Wear Clean/Comfortable clothing the morning of surgery Do not apply any deodorants/lotions.   Do not wear jewelry or makeup Do not wear lotions, powders, perfumes/colognes, or deodorant. Do not shave 48 hours prior to surgery.  Men may shave face and neck. Do not bring valuables to the  hospital. Do not wear nail polish, gel polish, artificial nails, or any other type of covering on natural nails (fingers and toes) If you have artificial nails or gel coating that need to be removed by a nail salon, please have this removed prior to surgery. Artificial nails or gel coating may interfere with anesthesia's ability to adequately monitor your vital signs. Remember to brush your teeth WITH YOUR REGULAR TOOTHPASTE.     pre-op instructions given over the phone.to Med tech, Selena Batten, at [facility].  The opportunity was given to ask  questions. No further questions asked.  Written instructions faxed to [phone]. Any further questions,please call me, Harriett Sine at [phone] or [phone].    The day of surgery please send a copy of the patient's most current medication administration record indicating the last time each medication was given. Thank you.

## 2024-01-05 ENCOUNTER — Encounter (HOSPITAL_COMMUNITY): Admission: RE | Payer: Self-pay | Source: Home / Self Care

## 2024-01-05 ENCOUNTER — Ambulatory Visit (HOSPITAL_COMMUNITY): Admission: RE | Admit: 2024-01-05 | Source: Home / Self Care | Admitting: Vascular Surgery

## 2024-01-05 ENCOUNTER — Inpatient Hospital Stay (HOSPITAL_COMMUNITY)

## 2024-01-05 ENCOUNTER — Encounter (HOSPITAL_COMMUNITY): Payer: Self-pay | Admitting: Physician Assistant

## 2024-01-05 DIAGNOSIS — R10811 Right upper quadrant abdominal tenderness: Secondary | ICD-10-CM | POA: Diagnosis not present

## 2024-01-05 DIAGNOSIS — I480 Paroxysmal atrial fibrillation: Secondary | ICD-10-CM | POA: Diagnosis not present

## 2024-01-05 DIAGNOSIS — N185 Chronic kidney disease, stage 5: Secondary | ICD-10-CM | POA: Diagnosis not present

## 2024-01-05 DIAGNOSIS — R7401 Elevation of levels of liver transaminase levels: Secondary | ICD-10-CM | POA: Insufficient documentation

## 2024-01-05 DIAGNOSIS — K828 Other specified diseases of gallbladder: Secondary | ICD-10-CM | POA: Diagnosis not present

## 2024-01-05 DIAGNOSIS — E782 Mixed hyperlipidemia: Secondary | ICD-10-CM

## 2024-01-05 DIAGNOSIS — I10 Essential (primary) hypertension: Secondary | ICD-10-CM

## 2024-01-05 LAB — COMPREHENSIVE METABOLIC PANEL
ALT: 3712 U/L — ABNORMAL HIGH (ref 0–44)
AST: 3883 U/L — ABNORMAL HIGH (ref 15–41)
Albumin: 2.8 g/dL — ABNORMAL LOW (ref 3.5–5.0)
Alkaline Phosphatase: 146 U/L — ABNORMAL HIGH (ref 38–126)
Anion gap: 10 (ref 5–15)
BUN: 74 mg/dL — ABNORMAL HIGH (ref 6–20)
CO2: 21 mmol/L — ABNORMAL LOW (ref 22–32)
Calcium: 8.7 mg/dL — ABNORMAL LOW (ref 8.9–10.3)
Chloride: 108 mmol/L (ref 98–111)
Creatinine, Ser: 4.57 mg/dL — ABNORMAL HIGH (ref 0.44–1.00)
GFR, Estimated: 10 mL/min — ABNORMAL LOW (ref >60)
Glucose, Bld: 137 mg/dL — ABNORMAL HIGH (ref 70–99)
Potassium: 3.5 mmol/L (ref 3.5–5.1)
Sodium: 139 mmol/L (ref 135–145)
Total Bilirubin: 1 mg/dL (ref 0.0–1.2)
Total Protein: 6.1 g/dL — ABNORMAL LOW (ref 6.5–8.1)

## 2024-01-05 LAB — CBC
HCT: 27.7 % — ABNORMAL LOW (ref 36.0–46.0)
Hemoglobin: 8.9 g/dL — ABNORMAL LOW (ref 12.0–15.0)
MCH: 30.1 pg (ref 26.0–34.0)
MCHC: 32.1 g/dL (ref 30.0–36.0)
MCV: 93.6 fL (ref 80.0–100.0)
Platelets: 171 10*3/uL (ref 150–400)
RBC: 2.96 MIL/uL — ABNORMAL LOW (ref 3.87–5.11)
RDW: 13.8 % (ref 11.5–15.5)
WBC: 6.2 10*3/uL (ref 4.0–10.5)
nRBC: 0 % (ref 0.0–0.2)

## 2024-01-05 LAB — IRON AND TIBC
Iron: 69 ug/dL (ref 28–170)
Saturation Ratios: 27 % (ref 10.4–31.8)
TIBC: 260 ug/dL (ref 250–450)
UIBC: 191 ug/dL

## 2024-01-05 LAB — FERRITIN: Ferritin: 4988 ng/mL — ABNORMAL HIGH (ref 11–307)

## 2024-01-05 LAB — URINALYSIS, ROUTINE W REFLEX MICROSCOPIC
Bilirubin Urine: NEGATIVE
Glucose, UA: NEGATIVE mg/dL
Ketones, ur: NEGATIVE mg/dL
Nitrite: NEGATIVE
Protein, ur: 100 mg/dL — AB
Specific Gravity, Urine: 1.01 (ref 1.005–1.030)
WBC, UA: >50 WBC/hpf (ref 0–5)
pH: 5 (ref 5.0–8.0)

## 2024-01-05 LAB — HIV ANTIBODY (ROUTINE TESTING W REFLEX): HIV Screen 4th Generation wRfx: NONREACTIVE

## 2024-01-05 LAB — MAGNESIUM: Magnesium: 1.9 mg/dL (ref 1.7–2.4)

## 2024-01-05 LAB — PHOSPHORUS: Phosphorus: 4.6 mg/dL (ref 2.5–4.6)

## 2024-01-05 LAB — MRSA NEXT GEN BY PCR, NASAL: MRSA by PCR Next Gen: NOT DETECTED

## 2024-01-05 SURGERY — ARTERIOVENOUS (AV) FISTULA CREATION
Anesthesia: Monitor Anesthesia Care | Laterality: Left

## 2024-01-05 MED ORDER — METOPROLOL SUCCINATE ER 25 MG PO TB24
25.0000 mg | ORAL_TABLET | Freq: Every day | ORAL | Status: DC
  Administered 2024-01-05 – 2024-01-10 (×6): 25 mg via ORAL
  Filled 2024-01-05 (×6): qty 1

## 2024-01-05 MED ORDER — LACTATED RINGERS IV SOLN: INTRAVENOUS | Status: DC

## 2024-01-05 MED ORDER — CHLORHEXIDINE GLUCONATE CLOTH 2 % EX PADS
6.0000 | MEDICATED_PAD | Freq: Every day | CUTANEOUS | Status: DC
  Administered 2024-01-06 – 2024-01-10 (×3): 6 via TOPICAL

## 2024-01-05 MED ORDER — LEVETIRACETAM 500 MG PO TABS
500.0000 mg | ORAL_TABLET | Freq: Two times a day (BID) | ORAL | Status: DC
  Administered 2024-01-05 – 2024-01-10 (×12): 500 mg via ORAL
  Filled 2024-01-05 (×12): qty 1

## 2024-01-05 MED ORDER — ROSUVASTATIN CALCIUM 10 MG PO TABS
10.0000 mg | ORAL_TABLET | Freq: Every day | ORAL | Status: DC
  Administered 2024-01-05: 10 mg via ORAL
  Filled 2024-01-05: qty 1

## 2024-01-05 MED ORDER — LABETALOL HCL 5 MG/ML IV SOLN
20.0000 mg | INTRAVENOUS | Status: DC | PRN
  Filled 2024-01-05: qty 4

## 2024-01-05 MED ORDER — APIXABAN 5 MG PO TABS
5.0000 mg | ORAL_TABLET | Freq: Two times a day (BID) | ORAL | Status: DC
  Administered 2024-01-05 – 2024-01-10 (×12): 5 mg via ORAL
  Filled 2024-01-05 (×12): qty 1

## 2024-01-05 NOTE — Hospital Course (Signed)
 58 y.o. female with medical history significant of combined systolic and diastolic CHF with recovered EF, hypertension, A-fib, seizure, T2DM, hyperlipidemia who presents to the emergency department from [facility] assisted living facility via EMS due to 2-day onset of generalized weakness and minimal oral intake minimal oral intake.  She denies chest pain, shortness of breath, nausea, vomiting. She was noted to have AKI on CKD and acute hepatitis of undetermined cause with markedly elevated LFTs.

## 2024-01-05 NOTE — ED Notes (Signed)
 Update given to [name] at [facility].

## 2024-01-05 NOTE — Consult Note (Addendum)
 Gastroenterology Consult   Referring Provider: No ref. provider found Primary Care Physician:  Christene Lye, FNP Primary Gastroenterologist:  Dr. Levon Hedger   Patient ID: [name]; [id number]; [birth year]   Admit date: 01/04/2024  LOS: 1 day   Date of Consultation: 01/05/2024  Reason for Consultation:  Elevated LFTs   History of Present Illness   [name] is a 58 y.o. year old female with history of alcohol use, CKD, Cocaine abuse, COPD, OSA not on CPAP, DM, HTN, A fib on eliquis seizures, stroke, C diff, UGI bleed secondary to grade C esophagitis and duodenal ulcers who presented to the ED Tuesday with generalized weakness over the past 2 days and minimal PO intake. Labs in the ED with significantly elevated LFTs. GI consulted for further evaluation  ED Course: Creatinine 4.91 Albumin 3 AST 4879, ALT 3761, alkaline phosphatase 139 Platelet count 184K, INR 1.6 Troponin 48/46 Acute hep panel negative Tylenol level negative Ethanol negative Ammonia 11  CMV, HSV, EBV, HIV in process  Right upper quadrant ultrasound CBD 14 mm  Probable tumefactive sludge in the gallbladder. No shadowing stone or sonographic evidence of acute cholecystitis. 2. Fatty liver. 3. Echogenic right kidney suggestive of medical renal disease. Clinical correlation is recommended.    Consult:  Patient reports generally feeling weak and noting that her blood sugars were low over the past 2 days.  She had some diarrhea a few days ago for which she took Imodium for.  She denies any abdominal pain, nausea, vomiting, blood in her stools or melena.  She has not been on any new medications or recent antibiotics that she is aware of.  Denies fevers or chills.  She denies any known history of liver disease, does not drink alcohol currently.  She denies any body aches or muscle pain.  Notably, patient is currently in the process of having dialysis catheter placed to start HD. Procedure was scheduled  for tomorrow   Past Medical History:  Diagnosis Date   Acute lower UTI 06/04/2018   Acute metabolic encephalopathy 03/11/2021   Acute on chronic respiratory failure with hypoxia (HCC) 07/20/2021   Acute respiratory failure requiring reintubation (HCC) 11/09/2012   Acute respiratory failure with hypoxia (HCC) 12/03/2020   AKI (acute kidney injury) (HCC) 06/04/2018   Alcohol use    Ankle fracture, lateral malleolus, closed 2013   Anxiety    Aortic atherosclerosis (HCC) 03/10/2021   Atherosclerosis of native artery of both legs with gangrene (HCC)    Breast mass, left 2013   CHF (congestive heart failure) (HCC)    a. EF 20-25% by echo in 05/2016 with cath showing normal cors b. EF 50-55% in 07/2020 c. 01/2021: EF at 55-60% with moderate LVH   Chronic anemia    CKD (chronic kidney disease), stage III (HCC)    Cocaine abuse (HCC)    COPD (chronic obstructive pulmonary disease) (HCC)    Diabetes mellitus, type 2 (HCC)    Diabetic Charcot foot (HCC)    DKA (diabetic ketoacidosis) (HCC) 10/29/2020   Essential hypertension    H/O open hand wound 11/22/2018   History of cardiomyopathy    History of GI bleed 2011   Hyperlipidemia    Lactic acid acidosis 09/11/2014   Metabolic encephalopathy 03/11/2021   Noncompliance    Obesity    Oral candidiasis 03/10/2021   PAF (paroxysmal atrial fibrillation) (HCC)    Panic attacks    PAT (paroxysmal atrial tachycardia) (HCC)    Previously on Amiodarone  Pneumonia due to COVID-19 virus 12/03/2020   Seizures (HCC) 10/01/2020   Sepsis due to skin infection (HCC) 04/01/2021   Shock (HCC) 10/29/2020   Sleep apnea    Not on CPAP   Stroke (HCC) 2018   Thrombocytosis 08/02/2017   Tobacco abuse    UGI bleed    Urinary incontinence     Past Surgical History:  Procedure Laterality Date   ANGIOPLASTY ILLIAC ARTERY Right 04/23/2021   Procedure: ANGIOPLASTY AND STENT SUPERFICIAL FEMORAL ARTERY;  Surgeon: Leonie Douglas, MD;  Location: [facility] OR;   Service: Vascular;  Laterality: Right;   AORTOGRAM  04/23/2021   Procedure: AORTOGRAM;  Surgeon: Leonie Douglas, MD;  Location: [facility] OR;  Service: Vascular;;   AORTOGRAM Left 04/30/2021   Procedure: left lower extremity angiogram with second order cannulation;  Surgeon: Leonie Douglas, MD;  Location: [facility] OR;  Service: Vascular;  Laterality: Left;   BIOPSY  11/12/2020   Procedure: BIOPSY;  Surgeon: Dolores Frame, MD;  Location: [facility];  Service: Gastroenterology;;   BREAST BIOPSY     CARDIAC CATHETERIZATION N/A 07/28/2016   Procedure: Left Heart Cath and Coronary Angiography;  Surgeon: Corky Crafts, MD;  Location: [facility] INVASIVE CV LAB;  Service: Cardiovascular;  Laterality: N/A;   COLONOSCOPY N/A 05/10/2019   Procedure: COLONOSCOPY;  Surgeon: West Bali, MD;  Location: [facility];  Service: Endoscopy;  Laterality: N/A;  Phenergan 12.5 mg IV in pre-op   DILATION AND CURETTAGE OF UTERUS     ESOPHAGOGASTRODUODENOSCOPY (EGD) WITH PROPOFOL N/A 11/12/2020   Procedure: ESOPHAGOGASTRODUODENOSCOPY (EGD) WITH PROPOFOL;  Surgeon: Dolores Frame, MD;  Location: [facility];  Service: Gastroenterology;  Laterality: N/A;   I & D EXTREMITY Bilateral 09/22/2017   Procedure: BILATERAL DEBRIDEMENT LEG/FOOT ULCERS, APPLY VERAFLO WOUND VAC;  Surgeon: Nadara Mustard, MD;  Location: [facility] OR;  Service: Orthopedics;  Laterality: Bilateral;   I & D EXTREMITY Right 10/11/2018   Procedure: IRRIGATION AND DEBRIDEMENT RIGHT HAND;  Surgeon: Dominica Severin, MD;  Location: [facility] OR;  Service: Orthopedics;  Laterality: Right;   I & D EXTREMITY Right 10/13/2018   Procedure: REPEAT IRRIGATION AND DEBRIDEMENT RIGHT HAND;  Surgeon: Dominica Severin, MD;  Location: [facility] OR;  Service: Orthopedics;  Laterality: Right;   I & D EXTREMITY Right 11/22/2018   Procedure: IRRIGATION AND DEBRIDEMENT AND PINNING RIGHT HAND;  Surgeon: Dominica Severin, MD;  Location: [facility] OR;  Service: Orthopedics;  Laterality:  Right;   IR RADIOLOGIST EVAL & MGMT  07/05/2018   LOWER EXTREMITY ANGIOGRAM Bilateral 04/23/2021   Procedure: RIGHT  AND LEFT LOWER EXTREMITY ANGIOGRAM;  Surgeon: Leonie Douglas, MD;  Location: [facility] OR;  Service: Vascular;  Laterality: Bilateral;   POLYPECTOMY  05/10/2019   Procedure: POLYPECTOMY;  Surgeon: West Bali, MD;  Location: [facility];  Service: Endoscopy;;   SKIN SPLIT GRAFT Bilateral 09/28/2017   Procedure: BILATERAL SPLIT THICKNESS SKIN GRAFT LEGS/FEET AND APPLY VAC;  Surgeon: Nadara Mustard, MD;  Location: [facility] OR;  Service: Orthopedics;  Laterality: Bilateral;   SKIN SPLIT GRAFT Right 11/22/2018   Procedure: SKIN GRAFT SPLIT THICKNESS;  Surgeon: Dominica Severin, MD;  Location: [facility] OR;  Service: Orthopedics;  Laterality: Right;    Prior to Admission medications   Medication Sig Start Date End Date Taking? Authorizing Provider  ACCU-CHEK GUIDE TEST test strip USE TO CHECK BLOOD SUGAR WITH MEALS AND AT BEDTIME. 09/27/23   Billie Lade, MD  acetaminophen (TYLENOL) 325 MG tablet  Take 2 tablets (650 mg total) by mouth every 6 (six) hours as needed for headache, fever or mild pain. 11/04/20   Johnson, Clanford L, MD  ammonium lactate (AMLACTIN) 12 % lotion Apply 1 Application topically as needed for dry skin. 01/05/23   Del Nigel Berthold, FNP  apixaban (ELIQUIS) 5 MG TABS tablet Take 1 tablet (5 mg total) by mouth 2 (two) times daily. 09/20/23   Bethann Berkshire, MD  ARIPiprazole (ABILIFY) 5 MG tablet Take 1 tablet (5 mg total) by mouth daily. 09/20/23   Bethann Berkshire, MD  ascorbic acid (VITAMIN C) 500 MG tablet TAKE (1) TABLET BY MOUTH TWICE DAILY. 06/30/23   Billie Lade, MD  aspirin 81 MG chewable tablet Chew 1 tablet (81 mg total) by mouth daily. 09/20/23   Bethann Berkshire, MD  azelastine (OPTIVAR) 0.05 % ophthalmic solution Place 1 drop into both eyes 2 (two) times daily. 11/25/23   [provider]  calcitRIOL (ROCALTROL) 0.25 MCG capsule Take 1 capsule (0.25 mcg  total) by mouth daily. 09/20/23   Bethann Berkshire, MD  FLUoxetine (PROZAC) 40 MG capsule Take 2 pills once a day 09/20/23   Bethann Berkshire, MD  fluticasone [facility]) 50 MCG/ACT nasal spray Place 2 sprays into both nostrils daily. 05/03/23   Gilmore Laroche, FNP  furosemide (LASIX) 40 MG tablet Take 40 mg by mouth 2 (two) times daily. 12/02/23   [provider]  HUMALOG KWIKPEN 100 UNIT/ML KwikPen Inject 1-10 Units into the skin See admin instructions. Inject 1-10 units into the skin before meals and at bedtime, PER SLIDING SCALE: BGL 151-200 = 1 unit; 201-250 = 2 units; 251-300 = 4 units; 301-350 = 6 units; 351-400 = 8 units; 401-450 = 10 units 03/28/21   [provider]  levETIRAcetam (KEPPRA) 500 MG tablet Take 1 tablet (500 mg total) by mouth 2 (two) times daily. 09/20/23   Bethann Berkshire, MD  loperamide (IMODIUM) 2 MG capsule Take 4 mg by mouth every 4 (four) hours as needed for diarrhea or loose stools. 07/09/23   [provider]  LORazepam (ATIVAN) 0.5 MG tablet TAKE (1) TABLET BY MOUTH TWICE DAILY. 06/30/23   Anabel Halon, MD  metoprolol succinate (TOPROL-XL) 25 MG 24 hr tablet TAKE (1) TABLET BY MOUTH IN THE MORNING. 06/30/23   Billie Lade, MD  Multiple Vitamins-Minerals (THERA-M) TABS TAKE (1) TABLET BY MOUTH ONCE DAILY. 06/30/23   Billie Lade, MD  NIFEdipine (PROCARDIA XL/NIFEDICAL XL) 60 MG 24 hr tablet Take 60 mg by mouth 2 (two) times daily. 09/14/23   [provider]  olopatadine (PATADAY) 0.1 % ophthalmic solution Place 1 drop into both eyes 2 (two) times daily.    [provider]  ondansetron (ZOFRAN-ODT) 4 MG disintegrating tablet Take 4 mg by mouth every 8 (eight) hours as needed for nausea or vomiting. 11/04/23   [provider]  oxybutynin (DITROPAN-XL) 5 MG 24 hr tablet TAKE (1) TABLET BY MOUTH ONCE DAILY. Patient taking differently: Take 5 mg by mouth at bedtime. 06/30/23   Billie Lade, MD  rosuvastatin (CRESTOR) 10 MG tablet  Take 10 mg by mouth at bedtime. 08/31/23   [provider]  sodium bicarbonate 650 MG tablet Take 650 mg by mouth 3 (three) times daily. 07/12/23   [provider]  thiamine (VITAMIN B1) 100 MG tablet TAKE (1) TABLET BY MOUTH ONCE DAILY. 06/30/23   Billie Lade, MD  traMADol (ULTRAM) 50 MG tablet Take 0.5 tablets by mouth  every 6 (six) hours as needed for moderate pain or severe pain.    [provider]  albuterol (PROAIR HFA) 108 (90 Base) MCG/ACT inhaler INHALE 2 PUFFS EVERY 6 HOURS AS NEEDED FOR SHORTNESS OF BREATH/WHEEZING. Patient not taking: Reported on 04/21/2021 08/05/20 04/21/21  Kerri Perches, MD    Current Facility-Administered Medications  Medication Dose Route Frequency Provider Last Rate Last Admin   apixaban (ELIQUIS) tablet 5 mg  5 mg Oral BID Adefeso, Oladapo, DO   5 mg at 01/05/24 0239   feeding supplement (ENSURE ENLIVE / ENSURE PLUS) liquid 237 mL  237 mL Oral BID BM Adefeso, Oladapo, DO       levETIRAcetam (KEPPRA) tablet 500 mg  500 mg Oral BID Adefeso, Oladapo, DO   500 mg at 01/05/24 0239   metoprolol succinate (TOPROL-XL) 24 hr tablet 25 mg  25 mg Oral Daily Adefeso, Oladapo, DO       ondansetron (ZOFRAN) tablet 4 mg  4 mg Oral Q6H PRN Adefeso, Oladapo, DO       Or   ondansetron (ZOFRAN) injection 4 mg  4 mg Intravenous Q6H PRN Adefeso, Oladapo, DO       rosuvastatin (CRESTOR) tablet 10 mg  10 mg Oral QHS Adefeso, Oladapo, DO   10 mg at 01/05/24 0239   Current Outpatient Medications  Medication Sig Dispense Refill   ACCU-CHEK GUIDE TEST test strip USE TO CHECK BLOOD SUGAR WITH MEALS AND AT BEDTIME. 100 strip 0   acetaminophen (TYLENOL) 325 MG tablet Take 2 tablets (650 mg total) by mouth every 6 (six) hours as needed for headache, fever or mild pain.     ammonium lactate (AMLACTIN) 12 % lotion Apply 1 Application topically as needed for dry skin. 400 g 4   apixaban (ELIQUIS) 5 MG TABS tablet Take 1 tablet (5 mg total) by mouth 2 (two)  times daily. 60 tablet 0   ARIPiprazole (ABILIFY) 5 MG tablet Take 1 tablet (5 mg total) by mouth daily. 60 tablet 0   ascorbic acid (VITAMIN C) 500 MG tablet TAKE (1) TABLET BY MOUTH TWICE DAILY. 60 tablet 0   aspirin 81 MG chewable tablet Chew 1 tablet (81 mg total) by mouth daily. 30 tablet 0   azelastine (OPTIVAR) 0.05 % ophthalmic solution Place 1 drop into both eyes 2 (two) times daily.     calcitRIOL (ROCALTROL) 0.25 MCG capsule Take 1 capsule (0.25 mcg total) by mouth daily. 30 capsule 0   FLUoxetine (PROZAC) 40 MG capsule Take 2 pills once a day 60 capsule 3   fluticasone (FLONASE) 50 MCG/ACT nasal spray Place 2 sprays into both nostrils daily. 16 g 6   furosemide (LASIX) 40 MG tablet Take 40 mg by mouth 2 (two) times daily.     HUMALOG KWIKPEN 100 UNIT/ML KwikPen Inject 1-10 Units into the skin See admin instructions. Inject 1-10 units into the skin before meals and at bedtime, PER SLIDING SCALE: BGL 151-200 = 1 unit; 201-250 = 2 units; 251-300 = 4 units; 301-350 = 6 units; 351-400 = 8 units; 401-450 = 10 units     levETIRAcetam (KEPPRA) 500 MG tablet Take 1 tablet (500 mg total) by mouth 2 (two) times daily. 60 tablet 0   loperamide (IMODIUM) 2 MG capsule Take 4 mg by mouth every 4 (four) hours as needed for diarrhea or loose stools.     LORazepam (ATIVAN) 0.5 MG tablet TAKE (1) TABLET BY MOUTH TWICE DAILY. 60 tablet 0   metoprolol succinate (  TOPROL-XL) 25 MG 24 hr tablet TAKE (1) TABLET BY MOUTH IN THE MORNING. 30 tablet 0   Multiple Vitamins-Minerals (THERA-M) TABS TAKE (1) TABLET BY MOUTH ONCE DAILY. 30 tablet 0   NIFEdipine (PROCARDIA XL/NIFEDICAL XL) 60 MG 24 hr tablet Take 60 mg by mouth 2 (two) times daily.     olopatadine (PATADAY) 0.1 % ophthalmic solution Place 1 drop into both eyes 2 (two) times daily.     ondansetron (ZOFRAN-ODT) 4 MG disintegrating tablet Take 4 mg by mouth every 8 (eight) hours as needed for nausea or vomiting.     oxybutynin (DITROPAN-XL) 5 MG 24 hr tablet  TAKE (1) TABLET BY MOUTH ONCE DAILY. (Patient taking differently: Take 5 mg by mouth at bedtime.) 30 tablet 0   rosuvastatin (CRESTOR) 10 MG tablet Take 10 mg by mouth at bedtime.     sodium bicarbonate 650 MG tablet Take 650 mg by mouth 3 (three) times daily.     thiamine (VITAMIN B1) 100 MG tablet TAKE (1) TABLET BY MOUTH ONCE DAILY. 30 tablet 0   traMADol (ULTRAM) 50 MG tablet Take 0.5 tablets by mouth every 6 (six) hours as needed for moderate pain or severe pain.      Allergies as of 01/04/2024 - Review Complete 01/04/2024  Allergen Reaction Noted   Sodium hypochlorite Anaphylaxis 07/29/2021    Family History  Problem Relation Age of Onset   Hypertension Mother    Heart attack Mother    Hypertension Father        CABG    Hypertension Sister    Hypertension Brother    Hypertension Sister    Cancer Sister        breast    Arthritis Other    Cancer Other    Diabetes Other    Asthma Other    Hypertension Daughter    Hypertension Son     Social History   Socioeconomic History   Marital status: Widowed    Spouse name: Not on file   Number of children: 2   Years of education: 9   Highest education level: 9th grade  Occupational History   Occupation: Disabled  Tobacco Use   Smoking status: Former    Current packs/day: 0.00    Average packs/day: 0.3 packs/day for 20.0 years (5.0 ttl pk-yrs)    Types: Cigarettes    Start date: 06/03/1997    Quit date: 06/03/2017    Years since quitting: 6.5    Passive exposure: Never   Smokeless tobacco: Never  Vaping Use   Vaping status: Never Used  Substance and Sexual Activity   Alcohol use: Not Currently    Alcohol/week: 0.0 standard drinks of alcohol    Comment: Quit in 2017   Drug use: Not Currently    Frequency: 3.0 times per week    Types: Marijuana, Cocaine    Comment: none since August 2018   Sexual activity: Not on file  Other Topics Concern   Not on file  Social History Narrative   Right handed   Drinks 1-2 cups  caffeine daily   Social Drivers of Health   Financial Resource Strain: Medium Risk (10/15/2020)   Overall Financial Resource Strain (CARDIA)    Difficulty of Paying Living Expenses: Somewhat hard  Food Insecurity: No Food Insecurity (01/04/2024)   Hunger Vital Sign    Worried About Running Out of Food in the Last Year: Never true    Ran Out of Food in the Last Year: Never true  Transportation Needs: No Transportation Needs (01/04/2024)   PRAPARE - Administrator, Civil Service (Medical): No    Lack of Transportation (Non-Medical): No  Physical Activity: Insufficiently Active (10/15/2020)   Exercise Vital Sign    Days of Exercise per Week: 7 days    Minutes of Exercise per Session: 20 min  Stress: Stress Concern Present (10/15/2020)   [employer] of Occupational Health - Occupational Stress Questionnaire    Feeling of Stress : To some extent  Social Connections: Moderately Isolated (10/15/2020)   Social Connection and Isolation Panel [NHANES]    Frequency of Communication with Friends and Family: More than three times a week    Frequency of Social Gatherings with Friends and Family: More than three times a week    Attends Religious Services: More than 4 times per year    Active Member of [employer] or Organizations: No    Attends Banker Meetings: Never    Marital Status: Widowed  Intimate Partner Violence: Not At Risk (01/04/2024)   Humiliation, Afraid, Rape, and Kick questionnaire    Fear of Current or Ex-Partner: No    Emotionally Abused: No    Physically Abused: No    Sexually Abused: No     Review of Systems   Gen: Denies any fever, chills, loss of appetite, change in weight or weight losss CV: Denies chest pain, heart palpitations, syncope, edema  Resp: Denies shortness of breath with rest, cough, wheezing, coughing up blood, and pleurisy. GI:  denies melena, hematochezia, nausea, vomiting, constipation, dysphagia, odyonophagia, early  satiety,abdominal pain or weight loss. +diarrhea  GU : Denies urinary burning, blood in urine, urinary frequency, and urinary incontinence. MS: Denies joint pain, limitation of movement, swelling, cramps, and atrophy.  Derm: Denies rash, itching, dry skin, hives. Psych: Denies depression, anxiety, memory loss, hallucinations, and confusion. Heme: Denies bruising or bleeding Neuro:  Denies any headaches, dizziness, paresthesias, shaking  Physical Exam   Vital Signs in last 24 hours: Temp:  [98.1 F (36.7 C)-98.5 F (36.9 C)] 98.1 F (36.7 C) (03/12 0711) Pulse Rate:  [66-101] 84 (03/12 0711) Resp:  [17-21] 19 (03/12 0711) BP: (90-155)/(65-101) 116/88 (03/12 0711) SpO2:  [89 %-99 %] 96 % (03/12 0711) Weight:  [80 kg] 80 kg (03/11 1522)    General:   Alert,  Well-developed, well-nourished, pleasant and cooperative in NAD Head:  Normocephalic and atraumatic. Eyes:  Sclera clear, no icterus.   Conjunctiva pink. Ears:  Normal auditory acuity. Mouth:  No deformity or lesions, dentition normal. Neck:  Supple; no masses Lungs:  Clear throughout to auscultation.   No wheezes, crackles, or rhonchi. No acute distress. Heart:  Regular rate and rhythm; no murmurs, clicks, rubs,  or gallops. Abdomen:  Soft, and nondistended. TTP to RUQ.  No masses, hepatosplenomegaly or hernias noted. Normal bowel sounds, without guarding, and without rebound.   Extremities:  Without clubbing or edema. Neurologic:  Alert and  oriented x4. Skin:  Intact without significant lesions or rashes. Psych:  Alert and cooperative. Normal mood and affect.   Labs/Studies   Recent Labs Recent Labs    01/04/24 1524 01/05/24 0309  WBC 7.4 6.2  HGB 9.7* 8.9*  HCT 30.5* 27.7*  PLT 184 171   BMET Recent Labs    01/04/24 1524 01/05/24 0309  NA 138 139  K 3.8 3.5  CL 105 108  CO2 19* 21*  GLUCOSE 94 137*  BUN 72* 74*  CREATININE 4.91* 4.57*  CALCIUM 9.0 8.7*  LFT Recent Labs    01/04/24 1524  01/05/24 0309  PROT 6.7 6.1*  ALBUMIN 3.0* 2.8*  AST 4,879* 3,883*  ALT 3,761* 3,712*  ALKPHOS 139* 146*  BILITOT 1.0 1.0   PT/INR Recent Labs    01/04/24 1816  LABPROT 19.1*  INR 1.6*   Hepatitis Panel Recent Labs    01/04/24 1816  HEPBSAG NON REACTIVE  HCVAB NON REACTIVE  HEPAIGM NON REACTIVE  HEPBIGM NON REACTIVE   C-Diff No results for input(s): "CDIFFTOX" in the last 72 hours.  Radiology/Studies US Abdomen Limited RUQ (LIVER/GB) Result Date: 01/04/2024 CLINICAL DATA:  Nausea and abdominal pain. EXAM: ULTRASOUND ABDOMEN LIMITED RIGHT UPPER QUADRANT COMPARISON:  None Available. FINDINGS: Gallbladder: There is nonshadowing echogenic content within the gallbladder which may represent tumefactive sludge. No shadowing stone. There is no gallbladder wall thickening or pericholecystic fluid. Negative sonographic Murphy's sign. Common bile duct: Diameter: 14 mm Liver: There is diffuse increased liver echogenicity most commonly seen in the setting of fatty infiltration. Superimposed inflammation or fibrosis is not excluded. Clinical correlation is recommended. Portal vein is patent on color Doppler imaging with normal direction of blood flow towards the liver. Other: Increased right renal echogenicity. IMPRESSION: 1. Probable tumefactive sludge in the gallbladder. No shadowing stone or sonographic evidence of acute cholecystitis. 2. Fatty liver. 3. Echogenic right kidney suggestive of medical renal disease. Clinical correlation is recommended. Electronically Signed   By: Elgie Collard M.D.   On: 01/04/2024 18:56   DG Chest Port 1 View Result Date: 01/04/2024 CLINICAL DATA:  Weakness. EXAM: PORTABLE CHEST 1 VIEW COMPARISON:  Chest radiograph dated 07/20/2021. FINDINGS: Shallow inspiration. No focal consolidation, pleural effusion, or pneumothorax. Mild cardiomegaly. No acute osseous pathology. IMPRESSION: 1. No active disease. 2. Mild cardiomegaly. Electronically Signed   By: Elgie Collard M.D.   On: 01/04/2024 18:16     Assessment   [name] is a 58 y.o. year old female who presented to the ED with 2 days of generalized weakness and decreased appetite, found to have significantly elevated aminotransferases and GI consulted for further evaluation  Elevated LFTs:  AST 4879,ALT 3761 alkaline phosphatase 139, bili WNL.  a/ox4, ammonia levels WNL, INR and platelet count are also normal  Last LFTs available in chart for review were done in November 2024 with AST 47 and ALT 65, alk phos that time was normal as was bilirubin. Patient denies any new medications or antibiotics.  She has no GI complaints other than some diarrhea a few days ago that resolved after taking Imodium.  She denies any alcohol intake.  She denies any muscle pain body aches. She has some mild TTP of RUQ on exam.    -right upper quadrant ultrasound with CBD of 14 mm, probable tumefactive sludge in the gallbladder though no shadowing stone or sonographic evidence of acute cholecystitis, fatty liver.  -Acute hepatitis panel negative   -HIV non reactive -EBV, HSV, CMV in process.  -I have also ordered autoimmune serologies to rule out AIH.  Etiology of her elevated LFTs is unclear at this time, LFT elevation is a hepatocellular pattern. differentials include autoimmune hepatitis, drug-induced liver injury, low suspicion for rhabdomyolysis as she has no muscle aches or body pain, cannot rule out ischemic injury though no hypotension noted thus far to indicate this.   Plan / Recommendations   Follow pending serologies Trend LFTs and INR daily  Continue Supportive measures Avoid hepatotoxic meds 5. Monitor for signs of HE/mental status changes  01/05/2024, 9:03 AM [name] L. Jeanmarie Hubert, MSN, APRN, AGNP-C Adult-Gerontology Nurse Practitioner [facility] at [facility]

## 2024-01-05 NOTE — Plan of Care (Signed)
  Problem: Acute Rehab OT Goals (only OT should resolve) Goal: Pt. Will Perform Grooming Flowsheets (Taken 01/05/2024 1029) Pt Will Perform Grooming: with modified independence Goal: Pt. Will Perform Upper Body Dressing Flowsheets (Taken 01/05/2024 1029) Pt Will Perform Upper Body Dressing: with modified independence Goal: Pt. Will Perform Lower Body Dressing Flowsheets (Taken 01/05/2024 1029) Pt Will Perform Lower Body Dressing:  with min assist  sitting/lateral leans Goal: Pt. Will Transfer To Toilet Flowsheets (Taken 01/05/2024 1029) Pt Will Transfer to Toilet: with modified independence Goal: Pt. Will Perform Toileting-Clothing Manipulation Flowsheets (Taken 01/05/2024 1029) Pt Will Perform Toileting - Clothing Manipulation and hygiene: with modified independence Goal: Pt/Caregiver Will Perform Home Exercise Program Flowsheets (Taken 01/05/2024 1029) Pt/caregiver will Perform Home Exercise Program:  Increased strength  Increased ROM  Independently  Both right and left upper extremity  Leannah Guse OT, MOT

## 2024-01-05 NOTE — Progress Notes (Signed)
 Patient arrived to unit around 1330. Patient alert and oriented but states the year is 2024 at first then changes it to 2025. Patient able to answer questions but noted to talk out loud to self when alone in room stating "stop slamming the doors." Neuro check done due to patient has a little slurred speech to which she states is because of her dentures. She has moderate grip, raises both arms and holds them up however she holds the right arm higher than the left which she says is because her left upper arm cramps. She holds both her feet/lower legs up in the arm and has FULL sensation both arms/legs/face and face/lips is symmetrical and tongue sticks out straight. All limbs are weak against resistance. Dr Laural Benes made aware.  No noted hemodialysis catheter to right subclavian noted. Chest is free of any wounds. Dr Laural Benes also made aware. Patient stated she does NOT have a dialysis catheter and states was supposed to be placed tomorrow.
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
## 2024-01-05 NOTE — Consult Note (Addendum)
 [facility] Renal Consultation Note  Requesting MD: Standley Dakins, MD Indication for Consultation:  CKD  Chief complaint: weakness  HPI:  [name] is a 58 y.o. female with a history of chronic combined systolic and diastolic CHF with recovered EF, HTN, CKD, afib, and type 2 DM who presented to the hospital from her SNF with weakness and minimal PO intake.  She denies n/v but states she has had no appetite.  No shortness of breath.  With regard to CKD she has a baseline Cr 3-3.5 per available labs.  Found to have AKI.  Slightly improved on repeat.   She was to have a dialysis catheter placed for HD tomorrow.  She follows with Dr. Wolfgang Phoenix and states that he advised her of this recommendation last week.  She is willing to do dialysis if it's needed but wants to avoid it if she can.  She states she was told her veins are too small for an AVF.  She doesn't have any access in place.  Strict ins/outs are not available but she has about 500 mL UOP in purewick canister.  She denies any difficulty urinating.  She has been on lasix 40 mg PO BID at home.  She had a Renal US earlier today with increased echogenicity, mild fullness right renal collecting system and no left hydro.  GI is to see.  [facility] has called and indicated that the patient was to have a graft and tunneled catheter placed in [place] on 3/13 per original plans.  On RUQ [name] sludge in the gallbladder without acute cholecystitis.      Creatinine, Ser  Date/Time Value Ref Range Status  01/05/2024 03:09 AM 4.57 (H) 0.44 - 1.00 mg/dL Final  78/29/5621 30:86 PM 4.91 (H) 0.44 - 1.00 mg/dL Final  57/84/6962 95:28 PM 3.46 (H) 0.44 - 1.00 mg/dL Final  41/32/4401 02:72 AM 3.05 (H) 0.44 - 1.00 mg/dL Final  53/66/4403 47:42 AM 3.08 (H) 0.57 - 1.00 mg/dL Final  59/56/3875 64:33 PM 2.34 (H) 0.44 - 1.00 mg/dL Final  29/51/8841 66:06 AM 1.12 (H) 0.44 - 1.00 mg/dL Final  30/16/0109 32:35 AM 1.05 (H) 0.44 - 1.00 mg/dL Final  57/32/2025  42:70 AM 1.01 (H) 0.44 - 1.00 mg/dL Final  62/37/6283 15:17 PM 1.10 (H) 0.44 - 1.00 mg/dL Final  61/60/7371 06:26 PM 1.00 0.44 - 1.00 mg/dL Final  94/85/4627 03:50 AM 1.08 (H) 0.44 - 1.00 mg/dL Final  09/38/1829 93:71 PM 1.26 (H) 0.44 - 1.00 mg/dL Final  69/67/8938 10:17 PM 1.05 (H) 0.44 - 1.00 mg/dL Final  51/11/5850 77:82 AM 1.05 (H) 0.44 - 1.00 mg/dL Final  42/35/3614 43:15 AM 1.05 (H) 0.44 - 1.00 mg/dL Final  40/05/6760 95:09 AM 0.94 0.44 - 1.00 mg/dL Final  32/67/1245 80:99 AM 1.13 (H) 0.44 - 1.00 mg/dL Final  83/38/2505 39:76 AM 1.15 (H) 0.44 - 1.00 mg/dL Final  73/41/9379 02:40 AM 1.03 (H) 0.44 - 1.00 mg/dL Final  97/35/3299 24:26 AM 0.96 0.44 - 1.00 mg/dL Final  83/41/9622 29:79 AM 1.09 (H) 0.44 - 1.00 mg/dL Final  89/21/1941 74:08 AM 1.02 (H) 0.44 - 1.00 mg/dL Final    Comment:    DELTA CHECK NOTED  05/07/2021 03:50 AM 1.04 (H) 0.44 - 1.00 mg/dL Final  14/48/1856 31:49 AM 1.08 (H) 0.44 - 1.00 mg/dL Final  70/26/3785 88:50 AM 1.03 (H) 0.44 - 1.00 mg/dL Final  27/74/1287 86:76 AM 1.11 (H) 0.44 - 1.00 mg/dL Final  72/06/4708 62:83 PM 1.20 (H) 0.44 - 1.00 mg/dL  Final  05/01/2021 04:04 AM 1.41 (H) 0.44 - 1.00 mg/dL Final  10/28/7251 66:44 AM 1.48 (H) 0.44 - 1.00 mg/dL Final  03/47/4259 56:38 AM 1.50 (H) 0.44 - 1.00 mg/dL Final  75/64/3329 51:88 AM 1.65 (H) 0.44 - 1.00 mg/dL Final  41/66/0630 16:01 AM 1.69 (H) 0.44 - 1.00 mg/dL Final  09/32/3557 32:20 PM 1.65 (H) 0.44 - 1.00 mg/dL Final  25/42/7062 37:62 AM 1.64 (H) 0.44 - 1.00 mg/dL Final  83/15/1761 60:73 AM 1.28 (H) 0.44 - 1.00 mg/dL Final  71/03/2693 85:46 AM 1.33 (H) 0.44 - 1.00 mg/dL Final  27/12/5007 38:18 AM 1.33 (H) 0.44 - 1.00 mg/dL Final  29/93/7169 67:89 AM 1.44 (H) 0.44 - 1.00 mg/dL Final  38/07/1750 02:58 AM 1.45 (H) 0.44 - 1.00 mg/dL Final  52/77/8242 35:36 PM 1.64 (H) 0.44 - 1.00 mg/dL Final  14/43/1540 08:67 AM 2.04 (H) 0.44 - 1.00 mg/dL Final  61/95/0932 67:12 PM 2.07 (H) 0.44 - 1.00 mg/dL Final  45/80/9983  38:25 AM 2.13 (H) 0.44 - 1.00 mg/dL Final  05/39/7673 41:93 PM 2.06 (H) 0.44 - 1.00 mg/dL Final  79/11/4095 35:32 PM 2.00 (H) 0.44 - 1.00 mg/dL Final  99/24/2683 41:96 AM 1.98 (H) 0.44 - 1.00 mg/dL Final  22/29/7989 21:19 PM 2.13 (H) 0.44 - 1.00 mg/dL Final  41/74/0814 48:18 AM 2.20 (H) 0.44 - 1.00 mg/dL Final  56/31/4970 26:37 AM 2.00 (H) 0.44 - 1.00 mg/dL Final  85/88/5027 74:12 PM 2.12 (H) 0.44 - 1.00 mg/dL Final  87/86/7672 09:47 PM 2.14 (H) 0.44 - 1.00 mg/dL Final     PMHx:   Past Medical History:  Diagnosis Date   Acute lower UTI 06/04/2018   Acute metabolic encephalopathy 03/11/2021   Acute on chronic respiratory failure with hypoxia (HCC) 07/20/2021   Acute respiratory failure requiring reintubation (HCC) 11/09/2012   Acute respiratory failure with hypoxia (HCC) 12/03/2020   AKI (acute kidney injury) (HCC) 06/04/2018   Alcohol use    Ankle fracture, lateral malleolus, closed 2013   Anxiety    Aortic atherosclerosis (HCC) 03/10/2021   Atherosclerosis of native artery of both legs with gangrene (HCC)    Breast mass, left 2013   CHF (congestive heart failure) (HCC)    a. EF 20-25% by echo in 05/2016 with cath showing normal cors b. EF 50-55% in 07/2020 c. 01/2021: EF at 55-60% with moderate LVH   Chronic anemia    CKD (chronic kidney disease), stage III (HCC)    Cocaine abuse (HCC)    COPD (chronic obstructive pulmonary disease) (HCC)    Diabetes mellitus, type 2 (HCC)    Diabetic Charcot foot (HCC)    DKA (diabetic ketoacidosis) (HCC) 10/29/2020   Essential hypertension    H/O open hand wound 11/22/2018   History of cardiomyopathy    History of GI bleed 2011   Hyperlipidemia    Lactic acid acidosis 09/11/2014   Metabolic encephalopathy 03/11/2021   Noncompliance    Obesity    Oral candidiasis 03/10/2021   PAF (paroxysmal atrial fibrillation) (HCC)    Panic attacks    PAT (paroxysmal atrial tachycardia) (HCC)    Previously on Amiodarone   Pneumonia due to  COVID-19 virus 12/03/2020   Seizures (HCC) 10/01/2020   Sepsis due to skin infection (HCC) 04/01/2021   Shock (HCC) 10/29/2020   Sleep apnea    Not on CPAP   Stroke (HCC) 2018   Thrombocytosis 08/02/2017   Tobacco abuse    UGI bleed    Urinary incontinence  Past Surgical History:  Procedure Laterality Date   ANGIOPLASTY ILLIAC ARTERY Right 04/23/2021   Procedure: ANGIOPLASTY AND STENT SUPERFICIAL FEMORAL ARTERY;  Surgeon: Leonie Douglas, MD;  [facility] OR;  Service: Vascular;  Laterality: Right;   AORTOGRAM  04/23/2021   Procedure: AORTOGRAM;  Surgeon: Leonie Douglas, MD;  Location: [facility] OR;  Service: Vascular;;   AORTOGRAM Left 04/30/2021   Procedure: left lower extremity angiogram with second order cannulation;  Surgeon: Leonie Douglas, MD;  Location[facility] OR;  Service: Vascular;  Laterality: Left;   BIOPSY  11/12/2020   Procedure: BIOPSY;  Surgeon: Dolores Frame, MD;  Location: [facility]ITE;  Service: Gastroenterology;;   BREAST BIOPSY     CARDIAC CATHETERIZATION N/A 07/28/2016   Procedure: Left Heart Cath and Coronary Angiography;  Surgeon: Corky Crafts, MD;  Location: [facility] INVASIVE CV LAB;  Service: Cardiovascular;  Laterality: N/A;   COLONOSCOPY N/A 05/10/2019   Procedure: COLONOSCOPY;  Surgeon: West Bali, MD;  Location: [facility];  Service: Endoscopy;  Laterality: N/A;  Phenergan 12.5 mg IV in pre-op   DILATION AND CURETTAGE OF UTERUS     ESOPHAGOGASTRODUODENOSCOPY (EGD) WITH PROPOFOL N/A 11/12/2020   Procedure: ESOPHAGOGASTRODUODENOSCOPY (EGD) WITH PROPOFOL;  Surgeon: Dolores Frame, MD;  Location: [facility];  Service: Gastroenterology;  Laterality: N/A;   I & D EXTREMITY Bilateral 09/22/2017   Procedure: BILATERAL DEBRIDEMENT LEG/FOOT ULCERS, APPLY VERAFLO WOUND VAC;  Surgeon: Nadara Mustard, MD;  Location: [facility] OR;  Service: Orthopedics;  Laterality: Bilateral;   I & D EXTREMITY Right 10/11/2018   Procedure: IRRIGATION AND  DEBRIDEMENT RIGHT HAND;  Surgeon: Dominica Severin, MD;  Location: [facility] OR;  Service: Orthopedics;  Laterality: Right;   I & D EXTREMITY Right 10/13/2018   Procedure: REPEAT IRRIGATION AND DEBRIDEMENT RIGHT HAND;  Surgeon: Dominica Severin, MD;  Location: [facility] OR;  Service: Orthopedics;  Laterality: Right;   I & D EXTREMITY Right 11/22/2018   Procedure: IRRIGATION AND DEBRIDEMENT AND PINNING RIGHT HAND;  Surgeon: Dominica Severin, MD;  Location: [facility] OR;  Service: Orthopedics;  Laterality: Right;   IR RADIOLOGIST EVAL & MGMT  07/05/2018   LOWER EXTREMITY ANGIOGRAM Bilateral 04/23/2021   Procedure: RIGHT  AND LEFT LOWER EXTREMITY ANGIOGRAM;  Surgeon: Leonie Douglas, MD;  Location: [facility] OR;  Service: Vascular;  Laterality: Bilateral;   POLYPECTOMY  05/10/2019   Procedure: POLYPECTOMY;  Surgeon: West Bali, MD;  Location: [facility];  Service: Endoscopy;;   SKIN SPLIT GRAFT Bilateral 09/28/2017   Procedure: BILATERAL SPLIT THICKNESS SKIN GRAFT LEGS/FEET AND APPLY VAC;  Surgeon: Nadara Mustard, MD;  Location: [facility] OR;  Service: Orthopedics;  Laterality: Bilateral;   SKIN SPLIT GRAFT Right 11/22/2018   Procedure: SKIN GRAFT SPLIT THICKNESS;  Surgeon: Dominica Severin, MD;  Location: [facility] OR;  Service: Orthopedics;  Laterality: Right;    Family Hx:  Family History  Problem Relation Age of Onset   Hypertension Mother    Heart attack Mother    Hypertension Father        CABG    Hypertension Sister    Hypertension Brother    Hypertension Sister    Cancer Sister        breast    Arthritis Other    Cancer Other    Diabetes Other    Asthma Other    Hypertension Daughter    Hypertension Son     Social History:  reports that she quit smoking about 6 years  ago. Her smoking use included cigarettes. She started smoking about 26 years ago. She has a 5 pack-year smoking history. She has never been exposed to tobacco smoke. She has never used smokeless tobacco. She reports that she does not currently use  alcohol. She reports that she does not currently use drugs after having used the following drugs: Marijuana and Cocaine. Frequency: 3.00 times per week.  Allergies:  Allergies  Allergen Reactions   Sodium Hypochlorite Anaphylaxis    (Bleach wipes) "I am not able to breathe"    Medications: Prior to Admission medications   Medication Sig Start Date End Date Taking? Authorizing Provider  ACCU-CHEK GUIDE TEST test strip USE TO CHECK BLOOD SUGAR WITH MEALS AND AT BEDTIME. 09/27/23   Billie Lade, MD  acetaminophen (TYLENOL) 325 MG tablet Take 2 tablets (650 mg total) by mouth every 6 (six) hours as needed for headache, fever or mild pain. 11/04/20   Johnson, Clanford L, MD  ammonium lactate (AMLACTIN) 12 % lotion Apply 1 Application topically as needed for dry skin. 01/05/23   Del Nigel Berthold, FNP  apixaban (ELIQUIS) 5 MG TABS tablet Take 1 tablet (5 mg total) by mouth 2 (two) times daily. 09/20/23   Bethann Berkshire, MD  ARIPiprazole (ABILIFY) 5 MG tablet Take 1 tablet (5 mg total) by mouth daily. 09/20/23   Bethann Berkshire, MD  ascorbic acid (VITAMIN C) 500 MG tablet TAKE (1) TABLET BY MOUTH TWICE DAILY. 06/30/23   Billie Lade, MD  aspirin 81 MG chewable tablet Chew 1 tablet (81 mg total) by mouth daily. 09/20/23   Bethann Berkshire, MD  azelastine (OPTIVAR) 0.05 % ophthalmic solution Place 1 drop into both eyes 2 (two) times daily. 11/25/23   [provider]  calcitRIOL (ROCALTROL) 0.25 MCG capsule Take 1 capsule (0.25 mcg total) by mouth daily. 09/20/23   Bethann Berkshire, MD  FLUoxetine (PROZAC) 40 MG capsule Take 2 pills once a day 09/20/23   Bethann Berkshire, MD  fluticasone [facility]) 50 MCG/ACT nasal spray Place 2 sprays into both nostrils daily. 05/03/23   Gilmore Laroche, FNP  furosemide (LASIX) 40 MG tablet Take 40 mg by mouth 2 (two) times daily. 12/02/23   [provider]  HUMALOG KWIKPEN 100 UNIT/ML KwikPen Inject 1-10 Units into the skin See admin instructions.  Inject 1-10 units into the skin before meals and at bedtime, PER SLIDING SCALE: BGL 151-200 = 1 unit; 201-250 = 2 units; 251-300 = 4 units; 301-350 = 6 units; 351-400 = 8 units; 401-450 = 10 units 03/28/21   [provider]  levETIRAcetam (KEPPRA) 500 MG tablet Take 1 tablet (500 mg total) by mouth 2 (two) times daily. 09/20/23   Bethann Berkshire, MD  loperamide (IMODIUM) 2 MG capsule Take 4 mg by mouth every 4 (four) hours as needed for diarrhea or loose stools. 07/09/23   [provider]  LORazepam (ATIVAN) 0.5 MG tablet TAKE (1) TABLET BY MOUTH TWICE DAILY. 06/30/23   Anabel Halon, MD  metoprolol succinate (TOPROL-XL) 25 MG 24 hr tablet TAKE (1) TABLET BY MOUTH IN THE MORNING. 06/30/23   Billie Lade, MD  Multiple Vitamins-Minerals (THERA-M) TABS TAKE (1) TABLET BY MOUTH ONCE DAILY. 06/30/23   Billie Lade, MD  NIFEdipine (PROCARDIA XL/NIFEDICAL XL) 60 MG 24 hr tablet Take 60 mg by mouth 2 (two) times daily. 09/14/23   [provider]  olopatadine (PATADAY) 0.1 % ophthalmic solution Place 1 drop into both eyes 2 (two) times daily.  [provider]  ondansetron (ZOFRAN-ODT) 4 MG disintegrating tablet Take 4 mg by mouth every 8 (eight) hours as needed for nausea or vomiting. 11/04/23   [provider]  oxybutynin (DITROPAN-XL) 5 MG 24 hr tablet TAKE (1) TABLET BY MOUTH ONCE DAILY. Patient taking differently: Take 5 mg by mouth at bedtime. 06/30/23   Billie Lade, MD  rosuvastatin (CRESTOR) 10 MG tablet Take 10 mg by mouth at bedtime. 08/31/23   [provider]  sodium bicarbonate 650 MG tablet Take 650 mg by mouth 3 (three) times daily. 07/12/23   [provider]  thiamine (VITAMIN B1) 100 MG tablet TAKE (1) TABLET BY MOUTH ONCE DAILY. 06/30/23   Billie Lade, MD  traMADol (ULTRAM) 50 MG tablet Take 0.5 tablets by mouth every 6 (six) hours as needed for moderate pain or severe pain.    [provider]  albuterol (PROAIR HFA)  108 (90 Base) MCG/ACT inhaler INHALE 2 PUFFS EVERY 6 HOURS AS NEEDED FOR SHORTNESS OF BREATH/WHEEZING. Patient not taking: Reported on 04/21/2021 08/05/20 04/21/21  Kerri Perches, MD    I have reviewed the patient's current and reported prior to admission medications.  Labs:     Latest Ref Rng & Units 01/05/2024    3:09 AM 01/04/2024    3:24 PM 09/16/2023   11:24 PM  BMP  Glucose 70 - 99 mg/dL 161  94  096   BUN 6 - 20 mg/dL 74  72  56   Creatinine 0.44 - 1.00 mg/dL 0.45  4.09  8.11   Sodium 135 - 145 mmol/L 139  138  143   Potassium 3.5 - 5.1 mmol/L 3.5  3.8  3.9   Chloride 98 - 111 mmol/L 108  105  114   CO2 22 - 32 mmol/L 21  19  20    Calcium 8.9 - 10.3 mg/dL 8.7  9.0  8.9     Urinalysis    Component Value Date/Time   COLORURINE YELLOW 07/21/2021 0405   APPEARANCEUR TURBID (A) 07/21/2021 0405   LABSPEC 1.025 07/21/2021 0405   PHURINE 5.5 07/21/2021 0405   GLUCOSEU NEGATIVE 07/21/2021 0405   HGBUR LARGE (A) 07/21/2021 0405   BILIRUBINUR SMALL (A) 07/21/2021 0405   KETONESUR NEGATIVE 07/21/2021 0405   PROTEINUR >300 (A) 07/21/2021 0405   UROBILINOGEN 0.2 09/11/2014 1319   NITRITE NEGATIVE 07/21/2021 0405   LEUKOCYTESUR LARGE (A) 07/21/2021 0405     ROS:  Pertinent items noted in HPI and remainder of comprehensive ROS otherwise negative.  Physical Exam: Vitals:   01/05/24 0408 01/05/24 0711  BP:  116/88  Pulse:  84  Resp: 20 19  Temp:  98.1 F (36.7 C)  SpO2:  96%     General:  adult female in stretcher in NAD  HEENT: NCAT Eyes: EOMI sclera anicteric Neck: supple trachea midline  Heart: S1s2 no rub Lungs: clear to auscultation; normal work of breathing on room air  Abdomen: soft/nt/nd Extremities: no edema appreciated; no cyanosis or clubbing Skin: no rash on extremities exposed  Neuro: alert and oriented x 3 provides hx and follows commands Psych normal mood and affect   Assessment/Plan:  # AKI - Slightly improved on repeat.  Note outpatient  she was felt to have progressed to ESRD however may have some degree of AKI.  Limited outpatient data and notes that I can locate   - She was to originally have tunneled catheter on 3/13 however she is hoping to delay or avoid  dialysis if she can - She would do HD if needed but states that she hopes she doesn't need to start HD yet - Assess dialysis needs daily   # CKD stage IV - follows with [facility] per her report  - Baseline Cr 3-3.5 per most recent data available to me    # Transaminitis  - Thankfully some slight downtrending - GI to see  - serologies were sent - avoid hypotension - hepatitis panel nonreactive - EtOH< 10  # Normocytic Anemia - update iron panel - may need ESA   # HTN  - Avoid hypotension   Disposition - please continue inpatient monitoring  Estanislado Emms 01/05/2024, 5:08 PM    Addendum  Note we are holding her home lasix and will assess diuretic needs daily   Estanislado Emms, MD 5:12 PM 01/05/2024

## 2024-01-05 NOTE — Progress Notes (Addendum)
 PROGRESS NOTE   [name]  WUJ:[id number] DOB: 07-26-1966 DOA: 01/04/2024 PCP: Christene Lye, FNP   Chief Complaint  Patient presents with   Weakness   Level of care: Stepdown  Brief Admission History:  58 y.o. 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  She denies chest pain, shortness of breath, nausea, vomiting. She was noted to have AKI on CKD and acute hepatitis of undetermined cause with markedly elevated LFTs.    Assessment and Plan:  Acute hepatitis  - marked rise in LFTs - acute hepatitis labs negative - GI consulted, working up for AIH - hydrate and supportive care for now - follow daily LFTs and PT/INR  AKI on stage IV CKD  - has not yet started dialysis - nephrology consulted  - follow daily labs   Type 2 DM controlled - as evidenced by A1c of 6.1%  - diet controlled for now  PAF - resumed home apixaban, metoprolol  Epilepsy - resumed home keppra  Essential hypertension - resume home metoprolol  Hyperlipidemia  - temporarily holding home rosuvastatin given marked elevation in LFTs   DVT prophylaxis: apixaban Code Status: full  Family Communication:  Disposition: home    Consultants:  GI Nephrology   Procedures:   Antimicrobials:    Subjective: No specific complaints at this time per patient.   Objective: Vitals:   01/05/24 1145 01/05/24 1200 01/05/24 1252 01/05/24 1333  BP:  (!) 130/112 131/87   Pulse:   83   Resp: 18 18 17    Temp:   97.9 F (36.6 C)   TempSrc:   Oral   SpO2:   97%   Weight:    76 kg  Height:    5\' 1"  (1.549 m)   No intake or output data in the 24 hours ending 01/05/24 1344 Filed Weights   01/04/24 1522 01/05/24 1333  Weight:  80 kg 76 kg   Examination:  General exam: Appears calm and comfortable  Respiratory system: Clear to auscultation. Respiratory effort normal. Cardiovascular system: normal S1 & S2 heard. No JVD, murmurs, rubs, gallops or clicks. No pedal edema. Gastrointestinal system: Abdomen is nondistended, soft and nontender. No organomegaly or masses felt. Normal bowel sounds heard. Central nervous system: Alert and oriented. No focal neurological deficits. Extremities: Symmetric 5 x 5 power. Skin: No rashes, lesions or ulcers. Psychiatry: Judgement and insight appear normal. Mood & affect appropriate.   Data Reviewed: I have personally reviewed following labs and imaging studies  CBC: Recent Labs  Lab 01/04/24 1524 01/05/24 0309  WBC 7.4 6.2  NEUTROABS 5.2  --   HGB 9.7* 8.9*  HCT 30.5* 27.7*  MCV 93.0 93.6  PLT 184 171    Basic Metabolic Panel: Recent Labs  Lab 01/04/24 1524 01/05/24 0309  NA 138 139  K 3.8 3.5  CL 105 108  CO2 19* 21*  GLUCOSE 94 137*  BUN 72* 74*  CREATININE 4.91* 4.57*  CALCIUM 9.0 8.7*  MG  --  1.9  PHOS  --  4.6    CBG: No results for input(s): "GLUCAP" in the last 168 hours.  Recent Results (from the past 240 hours)  Resp panel by RT-PCR (RSV, Flu A&B, Covid) Anterior Nasal Swab     Status: None  Collection Time: 01/04/24  4:13 PM   Specimen: Anterior Nasal Swab  Result Value Ref Range Status   SARS Coronavirus 2 by RT PCR NEGATIVE NEGATIVE Final    Comment: (NOTE) SARS-CoV-2 target nucleic acids are NOT DETECTED.  The SARS-CoV-2 RNA is generally detectable in upper respiratory specimens during the acute phase of infection. The lowest concentration of SARS-CoV-2 viral copies this assay can detect is 138 copies/mL. A negative result does not preclude SARS-Cov-2 infection and should not be used as the sole basis for treatment or other patient management decisions. A negative result may occur with  improper specimen collection/handling,  submission of specimen other than nasopharyngeal swab, presence of viral mutation(s) within the areas targeted by this assay, and inadequate number of viral copies(<138 copies/mL). A negative result must be combined with clinical observations, patient history, and epidemiological information. The expected result is Negative.  Fact Sheet for Patients:  BloggerCourse.com  Fact Sheet for Healthcare Providers:  SeriousBroker.it  This test is no t yet approved or cleared by the [place] FDA and  has been authorized for detection and/or diagnosis of SARS-CoV-2 by FDA under an Emergency Use Authorization (EUA). This EUA will remain  in effect (meaning this test can be used) for the duration of the COVID-19 declaration under Section 564(b)(1) of the Act, 21 U.S.C.section 360bbb-3(b)(1), unless the authorization is terminated  or revoked sooner.       Influenza A by PCR NEGATIVE NEGATIVE Final   Influenza B by PCR NEGATIVE NEGATIVE Final    Comment: (NOTE) The Xpert Xpress SARS-CoV-2/FLU/RSV plus assay is intended as an aid in the diagnosis of influenza from Nasopharyngeal swab specimens and should not be used as a sole basis for treatment. Nasal washings and aspirates are unacceptable for Xpert Xpress SARS-CoV-2/FLU/RSV testing.  Fact Sheet for Patients: BloggerCourse.com  Fact Sheet for Healthcare Providers: SeriousBroker.it  This test is not yet approved or cleared by the [place] FDA and has been authorized for detection and/or diagnosis of SARS-CoV-2 by FDA under an Emergency Use Authorization (EUA). This EUA will remain in effect (meaning this test can be used) for the duration of the COVID-19 declaration under Section 564(b)(1) of the Act, 21 U.S.C. section 360bbb-3(b)(1), unless the authorization is terminated or revoked.     Resp Syncytial Virus by PCR NEGATIVE  NEGATIVE Final    Comment: (NOTE) Fact Sheet for Patients: BloggerCourse.com  Fact Sheet for Healthcare Providers: SeriousBroker.it  This test is not yet approved or cleared by the [place] FDA and has been authorized for detection and/or diagnosis of SARS-CoV-2 by FDA under an Emergency Use Authorization (EUA). This EUA will remain in effect (meaning this test can be used) for the duration of the COVID-19 declaration under Section 564(b)(1) of the Act, 21 U.S.C. section 360bbb-3(b)(1), unless the authorization is terminated or revoked.  Performed at [facility], [address], [place], [place] [address]      Radiology Studies: US RENAL Result Date: 01/05/2024 CLINICAL DATA:  Acute kidney injury. EXAM: RENAL / URINARY TRACT ULTRASOUND COMPLETE COMPARISON:  Renal ultrasound from 06/14/2023. FINDINGS: Right Kidney: Renal measurements: 5.1 x 5.1 x 9.2 cm. = volume: 125.1 mL. There is diffuse increased cortical echogenicity. There is mild fullness in the renal collecting system without frank hydronephrosis. No contour deforming mass or stone large enough to cause acoustic shadowing. Left Kidney: Renal measurements: 4.9 x 5.0 x 8.7 cm. = volume: 112.1 mL. There is diffuse increased cortical echogenicity. No hydronephrosis, contour deforming mass or  stone large enough to cause acoustic shadowing. Bladder: Appears normal for degree of bladder distention. Other: None. IMPRESSION: *Bilateral kidneys exhibit increased cortical echogenicity, nonspecific but commonly seen with medical renal disease. *Mild fullness in the right renal collecting system without frank hydronephrosis. No left hydronephrosis. Electronically Signed   By: Jules Schick M.D.   On: 01/05/2024 10:33   US Abdomen Limited RUQ (LIVER/GB) Result Date: 01/04/2024 CLINICAL DATA:  Nausea and abdominal pain. EXAM: ULTRASOUND ABDOMEN LIMITED RIGHT UPPER QUADRANT COMPARISON:  None  Available. FINDINGS: Gallbladder: There is nonshadowing echogenic content within the gallbladder which may represent tumefactive sludge. No shadowing stone. There is no gallbladder wall thickening or pericholecystic fluid. Negative sonographic Murphy's sign. Common bile duct: Diameter: 14 mm Liver: There is diffuse increased liver echogenicity most commonly seen in the setting of fatty infiltration. Superimposed inflammation or fibrosis is not excluded. Clinical correlation is recommended. Portal vein is patent on color Doppler imaging with normal direction of blood flow towards the liver. Other: Increased right renal echogenicity. IMPRESSION: 1. Probable tumefactive sludge in the gallbladder. No shadowing stone or sonographic evidence of acute cholecystitis. 2. Fatty liver. 3. Echogenic right kidney suggestive of medical renal disease. Clinical correlation is recommended. Electronically Signed   By: Elgie Collard M.D.   On: 01/04/2024 18:56   DG Chest Port 1 View Result Date: 01/04/2024 CLINICAL DATA:  Weakness. EXAM: PORTABLE CHEST 1 VIEW COMPARISON:  Chest radiograph dated 07/20/2021. FINDINGS: Shallow inspiration. No focal consolidation, pleural effusion, or pneumothorax. Mild cardiomegaly. No acute osseous pathology. IMPRESSION: 1. No active disease. 2. Mild cardiomegaly. Electronically Signed   By: Elgie Collard M.D.   On: 01/04/2024 18:16    Scheduled Meds:  apixaban  5 mg Oral BID   [START ON 01/06/2024] Chlorhexidine Gluconate Cloth  6 each Topical Q0600   feeding supplement  237 mL Oral BID BM   levETIRAcetam  500 mg Oral BID   metoprolol succinate  25 mg Oral Daily   rosuvastatin  10 mg Oral QHS   Continuous Infusions:  lactated ringers       LOS: 1 day   Time spent: 57 mins  Abdur Hoglund Laural Benes, MD How to contact the [facility] Attending or Consulting provider 7A - 7P or covering provider during after hours 7P -7A, for this patient?  Check the care team in [facility] and look for a)  attending/consulting [facility] provider listed and b) the [facility] team listed Log into [email or web address] to find provider on call.  Locate the [facility] provider you are looking for under [facility] and page to a number that you can be directly reached. If you still have difficulty reaching the provider, please page the [facility] (Director on Call) for the Hospitalists listed on amion for assistance.  01/05/2024, 1:44 PM

## 2024-01-05 NOTE — Evaluation (Signed)
 Occupational Therapy Evaluation Patient Details Name: [name] MRN: [id number] DOB: 1966/10/06 Today's Date: 01/05/2024   History of Present Illness   58 y.o. 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  She denies chest pain, shortness of breath, nausea, vomiting.     Clinical Impressions Pt agreeable to OT evaluation. Pt awake and able to follow commands, but presenting with expressive communication deficits in the form of jumbled ans slurred speech. Pt demonstrates B UE weakens with mild A/ROM limitation at shouter but [facility] P/ROM for shoulder flexion. CGA for seated tasks with min to mod A for mobility with RW. Pt unable to take more than a couple very short steps with minimal ground covered. Pt left in the bed with NT notified of pt's request for a drink, which the NT stated she would get for the pt. Pt will benefit from continued OT in the hospital and recommended venue below to increase strength, balance, and endurance for safe ADL's.        If plan is discharge home, recommend the following:   A lot of help with walking and/or transfers;A lot of help with bathing/dressing/bathroom;Assistance with cooking/housework;Assist for transportation;Help with stairs or ramp for entrance     Functional Status Assessment   Patient has had a recent decline in their functional status and demonstrates the ability to make significant improvements in function in a reasonable and predictable amount of time.     Equipment Recommendations   None recommended by OT             Precautions/Restrictions   Precautions Precautions: Fall Recall of Precautions/Restrictions: Intact Restrictions Weight  Bearing Restrictions Per Provider Order: No     Mobility Bed Mobility Overal bed mobility: Needs Assistance Bed Mobility: Supine to Sit, Sit to Supine     Supine to sit: Min assist, Mod assist Sit to supine: Min assist, Mod assist   General bed mobility comments: slow labored movement    Transfers Overall transfer level: Needs assistance Equipment used: Rolling walker (2 wheels) Transfers: Sit to/from Stand Sit to Stand: Min assist           General transfer comment: Pt able to stand with min A from elevated HOB with use of RW. Pt struggles more with taking steps forward with RW.      Balance Overall balance assessment: Needs assistance Sitting-balance support: No upper extremity supported, Feet unsupported Sitting balance-Leahy Scale: Fair Sitting balance - Comments: seated at EOB   Standing balance support: Bilateral upper extremity supported, During functional activity, Reliant on assistive device for balance Standing balance-Leahy Scale: Poor Standing balance comment: poor to fair standing at EOB                           ADL either performed or assessed with clinical judgement   ADL Overall ADL's : Needs assistance/impaired Eating/Feeding: Independent;Sitting   Grooming: Set up;Sitting   Upper Body Bathing: Set up;Sitting   Lower Body Bathing: Maximal assistance;Sitting/lateral leans   Upper Body Dressing : Set up;Contact guard assist;Sitting   Lower Body Dressing: Maximal assistance;Sitting/lateral leans   Toilet Transfer: Minimal assistance;Moderate assistance;Rolling walker (2 wheels);Stand-pivot Statistician Details (indicate cue type and reason): Partially simulated via sit to stand  with RW and steps to L side. Pt unable to really take more than very short steps forward for a total of ~5 inches of actuall distance with RW. Toileting- Clothing Manipulation and Hygiene: Moderate assistance;Maximal assistance;Sitting/lateral lean        Functional mobility during ADLs: Moderate assistance;Maximal assistance;Rolling walker (2 wheels)       Vision Baseline Vision/History: 1 Wears glasses Ability to See in Adequate Light: 1 Impaired Patient Visual Report: No change from baseline Vision Assessment?: No apparent visual deficits     Perception Perception: Not tested       Praxis Praxis: Not tested       Pertinent Vitals/Pain Pain Assessment Pain Assessment: No/denies pain     Extremity/Trunk Assessment Upper Extremity Assessment Upper Extremity Assessment: Generalized weakness (3-/5 bilateral shoulder flexion; generally weak.)   Lower Extremity Assessment Lower Extremity Assessment: Defer to PT evaluation RLE Deficits / Details: 3+/5 MMT knee extension RLE Sensation: WNL LLE Deficits / Details: 3+/5 MMT knee extension LLE Sensation: WNL   Cervical / Trunk Assessment Cervical / Trunk Assessment: Kyphotic   Communication Communication Communication: Impaired Factors Affecting Communication: Reduced clarity of speech   Cognition Arousal: Alert Behavior During Therapy: WFL for tasks assessed/performed Cognition: No family/caregiver present to determine baseline             OT - Cognition Comments: Pt able to follow commands but was noted to have trouble with expressive communication.                 Following commands: Intact       Cueing  General Comments   Cueing Techniques: Verbal cues;Tactile cues                 Home Living Family/patient expects to be discharged to:: Assisted living                             Home Equipment: Wheelchair - manual   Additional Comments:  (per chart pt needs RW)      Prior Functioning/Environment Prior Level of Function : Needs assist       Physical Assist : ADLs (physical);Mobility (physical) Mobility (physical): Transfers;Gait ADLs (physical): Bathing;Dressing;IADLs Mobility Comments: Pt reporting intermittent assist  for transfers to [facility], independent for WC mobility. (per PT) ADLs Comments: Pt reports assist needed for bathing and dressing at baseline.    OT Problem List: Decreased strength;Decreased range of motion;Decreased activity tolerance;Impaired balance (sitting and/or standing)   OT Treatment/Interventions: Self-care/ADL training;Therapeutic exercise;DME and/or AE instruction;Therapeutic activities;Patient/family education;Balance training      OT Goals(Current goals can be found in the care plan section)   Acute Rehab OT Goals Patient Stated Goal: improve function OT Goal Formulation: With patient Time For Goal Achievement: 01/19/24 Potential to Achieve Goals: Good   OT Frequency:  Min 2X/week                                   End of Session Equipment Utilized During Treatment: Rolling walker (2 wheels);Gait belt Nurse Communication: Other (comment) (NT notified that pt was requesting a drink.)  Activity Tolerance: Patient tolerated treatment well Patient left: in bed  OT Visit Diagnosis: Unsteadiness on feet (R26.81);Other abnormalities of gait and mobility (R26.89);Muscle weakness (generalized) (M62.81);Cognitive communication deficit (R41.841)                Time: 4401-0272 OT Time  Calculation (min): 14 min Charges:  OT General Charges $OT Visit: 1 Visit OT Evaluation $OT Eval Low Complexity: 1 Low  Tamya Denardo OT, MOT  Danie Chandler 01/05/2024, 10:25 AM

## 2024-01-05 NOTE — ED Notes (Signed)
 Patient was changed and cleaned. Patient given a warm blanket and water.

## 2024-01-05 NOTE — Evaluation (Signed)
 Physical Therapy Evaluation Patient Details Name: [name] MRN: [id number] DOB: [birth year] Today's Date: 01/05/2024  History of Present Illness  58 y.o. female with medical history significant of combined systolic and diastolic CHF with recovered EF, hypertension, A-fib, seizure, T2DM, hyperlipidemia who presents to the emergency department from [facility] assisted living facility via EMS due to 2-day onset of generalized weakness and minimal oral intake minimal oral intake.  She denies chest pain, shortness of breath, nausea, vomiting.  Clinical Impression   Pt tolerated today's Physical Therapy Evaluation, however lethargic with questionable ability to relay PLOF at this current level. Pt's baseline is, from chart review and discussion with pt, independent for transfers to [facility], and independent with WC mobilit, assist given for ADLs. Currently, pt demonstrating limitations in functional mobility, transfers, gait and ADLs due to muscle weakness, deconditioning and balance deficits. Pt at mod assist for bed mobility and OOB mobility with AD.  Based upon these deficits/impairments, patient will benefit from continued skilled physical therapy services during remainder of hospital stay and at the next recommended venue of care to address deficits and promote return to optimal function.                If plan is discharge home, recommend the following: A lot of help with walking and/or transfers;Two people to help with walking and/or transfers;A lot of help with bathing/dressing/bathroom;Two people to help with bathing/dressing/bathroom   Can travel by private vehicle   No    Equipment Recommendations    Recommendations for Other Services       Functional Status Assessment Patient has had a recent decline in their functional status and demonstrates the ability to make significant improvements in function in a reasonable and predictable amount of time.     Precautions /  Restrictions Precautions Precautions: Fall Restrictions Weight Bearing Restrictions Per Provider Order: No      Mobility  Bed Mobility Overal bed mobility: Needs Assistance Bed Mobility: Supine to Sit, Sit to Supine     Supine to sit: Mod assist Sit to supine: Mod assist   General bed mobility comments: slow, labored movement. Cues given for bedrail use and preparation for movement. Superior sliding HOB total x 2 for appropraite bed positioning.    Transfers Overall transfer level: Needs assistance Equipment used: Rolling walker (2 wheels) Transfers: Sit to/from Stand Sit to Stand: Mod assist           General transfer comment: Pt slidinginto stand from ED bed, mod assist to return back to sitting on ED bed. Second therapist brought stool to utilize further BLE pushing with BUE support on RW to finalize sitting position.    Ambulation/Gait Ambulation/Gait assistance: Max assist Gait Distance (Feet): 1 Feet           General Gait Details: Lateral side steps at EOB with max assit x 1 or mod assist x2 with RW management for assist.  Stairs            Wheelchair Mobility     Tilt Bed    Modified Rankin (Stroke Patients Only)       Balance Overall balance assessment: Needs assistance Sitting-balance support: No upper extremity supported, Feet unsupported Sitting balance-Leahy Scale: Poor Sitting balance - Comments: unsteady and reduced trunk control.   Standing balance support: Bilateral upper extremity supported, During functional activity, Reliant on assistive device for balance Standing balance-Leahy Scale: Poor Standing balance comment: Poor lateral weight shifts in standing.  Pertinent Vitals/Pain Pain Assessment Pain Assessment: No/denies pain    Home Living Family/patient expects to be discharged to:: Assisted living                 Home Equipment: Wheelchair - manual Additional Comments:  needs RW    Prior Function Prior Level of Function : Needs assist       Physical Assist : Mobility (physical);ADLs (physical) Mobility (physical): Transfers;Gait ADLs (physical): Bathing Mobility Comments: Pt reporting intermittent assist for transfers to [facility], independent for WC mobility. ADLs Comments: Per chart review and pt report, assist with bathing. (Poor orientation/awarness and historian. Questionable PLOF at current cognitive state.)     Extremity/Trunk Assessment   Upper Extremity Assessment Upper Extremity Assessment: Defer to OT evaluation    Lower Extremity Assessment Lower Extremity Assessment: Generalized weakness;RLE deficits/detail;LLE deficits/detail RLE Deficits / Details: 3+/5 MMT knee extension RLE Sensation: WNL LLE Deficits / Details: 3+/5 MMT knee extension LLE Sensation: WNL    Cervical / Trunk Assessment Cervical / Trunk Assessment: Kyphotic  Communication   Communication Communication: Impaired Factors Affecting Communication: Reduced clarity of speech    Cognition Arousal: Lethargic Behavior During Therapy: WFL for tasks assessed/performed, Flat affect   PT - Cognitive impairments: Difficult to assess, Orientation, Awareness   Orientation impairments: Time                   PT - Cognition Comments: Inaccurate date reported by patient. Following commands: Intact       Cueing Cueing Techniques: Verbal cues, Tactile cues     General Comments      Exercises     Assessment/Plan    PT Assessment Patient needs continued PT services  PT Problem List Decreased strength;Decreased activity tolerance;Decreased balance;Decreased mobility;Decreased safety awareness       PT Treatment Interventions DME instruction;Gait training;Functional mobility training;Therapeutic activities;Therapeutic exercise;Balance training;Neuromuscular re-education    PT Goals (Current goals can be found in the Care Plan section)  Acute Rehab PT  Goals Patient Stated Goal: No goal stated at this job. PT Goal Formulation: With patient Time For Goal Achievement: 01/19/24 Potential to Achieve Goals: Good    Frequency Min 3X/week     Co-evaluation               AM-PAC PT "6 Clicks" Mobility  Outcome Measure Help needed turning from your back to your side while in a flat bed without using bedrails?: A Little Help needed moving from lying on your back to sitting on the side of a flat bed without using bedrails?: A Little Help needed moving to and from a bed to a chair (including a wheelchair)?: A Lot Help needed standing up from a chair using your arms (e.g., wheelchair or bedside chair)?: A Lot Help needed to walk in hospital room?: A Lot Help needed climbing 3-5 steps with a railing? : Total 6 Click Score: 13    End of Session Equipment Utilized During Treatment: Gait belt Activity Tolerance: Patient tolerated treatment well;Patient limited by lethargy Patient left: in bed;with call bell/phone within reach Nurse Communication: Mobility status PT Visit Diagnosis: Unsteadiness on feet (R26.81);Other abnormalities of gait and mobility (R26.89);Muscle weakness (generalized) (M62.81)    Time: 0981-1914 PT Time Calculation (min) (ACUTE ONLY): 26 min   Charges:   PT Evaluation $PT Eval Low Complexity: 1 Low   PT General Charges $$ ACUTE PT VISIT: 1 Visit         Elie Goody, DPT [facility]-  [place] [phone] office  Nelida Meuse 01/05/2024, 9:31 AM

## 2024-01-05 NOTE — ED Notes (Signed)
 Pt removed purewick and bed soiled. Incontinent care provided and clean linens applied.

## 2024-01-05 NOTE — ED Notes (Addendum)
 Johnn Hai NP [phone] about pt and stated she was set to have graft/ central venous catheter place tomorrow [facility] [place]. Her first dialysis session was supposed to be tomorrow as well. Provider notified.

## 2024-01-05 NOTE — Plan of Care (Signed)
  Problem: Acute Rehab PT Goals(only PT should resolve) Goal: Pt Will Go Supine/Side To Sit Flowsheets (Taken 01/05/2024 0935) Pt will go Supine/Side to Sit: Independently Goal: Patient Will Transfer Sit To/From Stand Flowsheets (Taken 01/05/2024 0935) Patient will transfer sit to/from stand: with contact guard assist Goal: Pt Will Transfer Bed To Chair/Chair To Bed Flowsheets (Taken 01/05/2024 0935) Pt will Transfer Bed to Chair/Chair to Bed: Independently Goal: Pt Will Ambulate Flowsheets (Taken 01/05/2024 0935) Pt will Ambulate:  15 feet  with modified independence  with least restrictive assistive device  Nelida Meuse PT, DPT [facility] Rehabilitation- [place] [phone] office

## 2024-01-05 NOTE — Plan of Care (Signed)

## 2024-01-06 ENCOUNTER — Encounter (HOSPITAL_COMMUNITY): Payer: Self-pay | Admitting: Internal Medicine

## 2024-01-06 ENCOUNTER — Inpatient Hospital Stay (HOSPITAL_COMMUNITY)

## 2024-01-06 DIAGNOSIS — K838 Other specified diseases of biliary tract: Secondary | ICD-10-CM

## 2024-01-06 DIAGNOSIS — F1491 Cocaine use, unspecified, in remission: Secondary | ICD-10-CM | POA: Diagnosis not present

## 2024-01-06 DIAGNOSIS — E782 Mixed hyperlipidemia: Secondary | ICD-10-CM | POA: Diagnosis not present

## 2024-01-06 DIAGNOSIS — R7401 Elevation of levels of liver transaminase levels: Secondary | ICD-10-CM | POA: Diagnosis not present

## 2024-01-06 DIAGNOSIS — I480 Paroxysmal atrial fibrillation: Secondary | ICD-10-CM | POA: Diagnosis not present

## 2024-01-06 DIAGNOSIS — K828 Other specified diseases of gallbladder: Secondary | ICD-10-CM | POA: Diagnosis not present

## 2024-01-06 DIAGNOSIS — R748 Abnormal levels of other serum enzymes: Secondary | ICD-10-CM

## 2024-01-06 DIAGNOSIS — N185 Chronic kidney disease, stage 5: Secondary | ICD-10-CM | POA: Diagnosis not present

## 2024-01-06 LAB — CBC
HCT: 29.7 % — ABNORMAL LOW (ref 36.0–46.0)
Hemoglobin: 9.3 g/dL — ABNORMAL LOW (ref 12.0–15.0)
MCH: 29.2 pg (ref 26.0–34.0)
MCHC: 31.3 g/dL (ref 30.0–36.0)
MCV: 93.4 fL (ref 80.0–100.0)
Platelets: 167 10*3/uL (ref 150–400)
RBC: 3.18 MIL/uL — ABNORMAL LOW (ref 3.87–5.11)
RDW: 13.7 % (ref 11.5–15.5)
WBC: 4.4 10*3/uL (ref 4.0–10.5)
nRBC: 0 % (ref 0.0–0.2)

## 2024-01-06 LAB — COMPREHENSIVE METABOLIC PANEL
ALT: 2342 U/L — ABNORMAL HIGH (ref 0–44)
AST: 1377 U/L — ABNORMAL HIGH (ref 15–41)
Albumin: 2.7 g/dL — ABNORMAL LOW (ref 3.5–5.0)
Alkaline Phosphatase: 154 U/L — ABNORMAL HIGH (ref 38–126)
Anion gap: 10 (ref 5–15)
BUN: 69 mg/dL — ABNORMAL HIGH (ref 6–20)
CO2: 22 mmol/L (ref 22–32)
Calcium: 8.5 mg/dL — ABNORMAL LOW (ref 8.9–10.3)
Chloride: 109 mmol/L (ref 98–111)
Creatinine, Ser: 3.85 mg/dL — ABNORMAL HIGH (ref 0.44–1.00)
GFR, Estimated: 13 mL/min — ABNORMAL LOW (ref >60)
Glucose, Bld: 100 mg/dL — ABNORMAL HIGH (ref 70–99)
Potassium: 3.6 mmol/L (ref 3.5–5.1)
Sodium: 141 mmol/L (ref 135–145)
Total Bilirubin: 0.7 mg/dL (ref 0.0–1.2)
Total Protein: 6 g/dL — ABNORMAL LOW (ref 6.5–8.1)

## 2024-01-06 LAB — PROTIME-INR
INR: 2 — ABNORMAL HIGH (ref 0.8–1.2)
Prothrombin Time: 23.1 s — ABNORMAL HIGH (ref 11.4–15.2)

## 2024-01-06 LAB — CMV IGM: CMV IgM: <30 [AU]/ml (ref 0.0–29.9)

## 2024-01-06 LAB — EBV AB TO VIRAL CAPSID AG PNL, IGG+IGM
EBV VCA IgG: >600 U/mL — ABNORMAL HIGH (ref 0.0–17.9)
EBV VCA IgM: <36 U/mL (ref 0.0–35.9)

## 2024-01-06 LAB — ANTI-SMOOTH MUSCLE ANTIBODY, IGG: F-Actin IgG: 6 U (ref 0–19)

## 2024-01-06 LAB — ANA W/REFLEX IF POSITIVE: Anti Nuclear Antibody (ANA): NEGATIVE

## 2024-01-06 LAB — CK: Total CK: 200 U/L (ref 38–234)

## 2024-01-06 LAB — MITOCHONDRIAL ANTIBODIES: Mitochondrial M2 Ab, IgG: <20 U (ref 0.0–20.0)

## 2024-01-06 LAB — IGG: IgG (Immunoglobin G), Serum: 943 mg/dL (ref 586–1602)

## 2024-01-06 MED ORDER — POLYETHYLENE GLYCOL 3350 17 G PO PACK
17.0000 g | PACK | Freq: Every day | ORAL | Status: DC
  Administered 2024-01-08: 17 g via ORAL
  Filled 2024-01-06 (×5): qty 1

## 2024-01-06 MED ORDER — VITAMIN K1 10 MG/ML IJ SOLN
10.0000 mg | Freq: Once | INTRAVENOUS | Status: AC
  Administered 2024-01-06: 10 mg via INTRAVENOUS
  Filled 2024-01-06: qty 1

## 2024-01-06 NOTE — TOC Progression Note (Signed)
 Transition of Care [facility]) - Progression Note    Patient Details  Name: [name] MRN: [id number] Date of Birth: May 21, 1966  Transition of Care [facility]) CM/SW Contact  Karn Cassis, [place] Phone Number: 01/06/2024, 11:47 AM  Clinical Narrative:  PT worked with pt again today and recommend HHPT. Discussed with pt, son, and Damian Leavell at [facility]. All are in agreement. Trudy requests [facility] and pt agreeable. Referred and accepted by Clydie Braun with [facility]. Will need HHPT order.      Expected Discharge Plan:  (to be determined) Barriers to Discharge: Continued Medical Work up  Expected Discharge Plan and Services In-house Referral: Clinical Social Work     Living arrangements for the past 2 months: Assisted Living Facility                           HH Arranged: PT [facility] Agency: [facility] Date HH Agency Contacted: 01/06/24 Time HH Agency Contacted: 1145 Representative spoke with at [facility] Agency: Clydie Braun   Social Determinants of Health (SDOH) Interventions SDOH Screenings   Food Insecurity: No Food Insecurity (01/04/2024)  Housing: Low Risk  (01/04/2024)  Transportation Needs: No Transportation Needs (01/04/2024)  Utilities: Not At Risk (01/04/2024)  Alcohol Screen: Low Risk  (10/15/2020)  Depression (PHQ2-9): Low Risk  (07/26/2023)  Financial Resource Strain: Medium Risk (10/15/2020)  Physical Activity: Insufficiently Active (10/15/2020)  Social Connections: Moderately Isolated (10/15/2020)  Stress: Stress Concern Present (10/15/2020)  Tobacco Use: Medium Risk (01/04/2024)    Readmission Risk Interventions    01/05/2024    1:38 PM 07/25/2021    9:44 AM  Readmission Risk Prevention Plan  Transportation Screening Complete Complete  Medication Review [employer]) Complete Complete  PCP or Specialist appointment within 3-5 days of discharge  Complete  HRI or Home Care Consult Complete Complete  SW Recovery Care/Counseling Consult Complete  Complete  Palliative Care Screening Not Applicable Not Applicable  Skilled Nursing Facility -- Complete

## 2024-01-06 NOTE — Progress Notes (Signed)
 Physical Therapy Treatment Patient Details Name: [name] MRN: [id number] DOB: 1966/04/20 Today's Date: 01/06/2024   History of Present Illness 58 y.o. female with medical history significant of combined systolic and diastolic CHF with recovered EF, hypertension, A-fib, seizure, T2DM, hyperlipidemia who presents to the emergency department from [facility] assisted living facility via EMS due to 2-day onset of generalized weakness and minimal oral intake minimal oral intake.  She denies chest pain, shortness of breath, nausea, vomiting.    PT Comments  Patient demonstrates good return for facing chair and leaning on armrest of chair for standing and step pivoting to chair with CGA and no loss of balance.  Patient states she is non-ambulatory at her ALF and uses wheelchair for mobility supervised by ALF staff.  Patient tolerated sitting up in chair after therapy. Patient will benefit from continued skilled physical therapy in hospital and recommended venue below to increase strength, balance, endurance for safe ADLs and gait.    If plan is discharge home, recommend the following: A lot of help with bathing/dressing/bathroom;A lot of help with walking and/or transfers;Help with stairs or ramp for entrance;Assistance with cooking/housework   Can travel by private vehicle     Yes  Equipment Recommendations  None recommended by PT    Recommendations for Other Services       Precautions / Restrictions Precautions Precautions: Fall Recall of Precautions/Restrictions: Intact Restrictions Weight Bearing Restrictions Per Provider Order: No     Mobility  Bed Mobility Overal bed mobility: Needs Assistance Bed Mobility: Rolling, Sidelying to Sit Rolling: Contact guard assist Sidelying to sit: Min assist, Mod assist       General bed mobility comments: fair/good return for rolling to side and sitting up at bedside, but required assistance due to BUE weakness    Transfers Overall  transfer level: Needs assistance Equipment used: 1 person hand held assist Transfers: Sit to/from Stand, Bed to chair/wheelchair/BSC Sit to Stand: Contact guard assist   Step pivot transfers: Contact guard assist       General transfer comment: demosntrates good return for facing chair  and leaning on armrest of chair with BUE and completing step pivot without loss of balance    Ambulation/Gait Ambulation/Gait assistance: Contact guard assist, Min assist Gait Distance (Feet): 3 Feet Assistive device: 1 person hand held assist (leaning on armrest of chair) Gait Pattern/deviations: Decreased step length - right, Decreased step length - left, Decreased stride length Gait velocity: slow     General Gait Details: limited to a few steps during step pivot transfer to chair   Stairs             Wheelchair Mobility     Tilt Bed    Modified Rankin (Stroke Patients Only)       Balance Overall balance assessment: Needs assistance Sitting-balance support: Feet supported, No upper extremity supported Sitting balance-Leahy Scale: Fair Sitting balance - Comments: fair/good seated at EOB   Standing balance support: Bilateral upper extremity supported, During functional activity, Reliant on assistive device for balance Standing balance-Leahy Scale: Poor Standing balance comment: fair/poor leaning on armrest of chair                            Communication Communication Communication: No apparent difficulties  Cognition Arousal: Alert Behavior During Therapy: WFL for tasks assessed/performed   PT - Cognitive impairments: No apparent impairments  Cueing Cueing Techniques: Verbal cues, Tactile cues  Exercises      General Comments        Pertinent Vitals/Pain Pain Assessment Pain Assessment: No/denies pain    Home Living                          Prior Function            PT Goals (current  goals can now be found in the care plan section) Acute Rehab PT Goals Patient Stated Goal: return home with ALF staff to assist PT Goal Formulation: With patient Time For Goal Achievement: 01/19/24 Potential to Achieve Goals: Good Progress towards PT goals: Progressing toward goals    Frequency    Min 3X/week      PT Plan      Co-evaluation PT/OT/SLP Co-Evaluation/Treatment: Yes            AM-PAC PT "6 Clicks" Mobility   Outcome Measure  Help needed turning from your back to your side while in a flat bed without using bedrails?: A Little Help needed moving from lying on your back to sitting on the side of a flat bed without using bedrails?: A Lot Help needed moving to and from a bed to a chair (including a wheelchair)?: A Little Help needed standing up from a chair using your arms (e.g., wheelchair or bedside chair)?: A Little Help needed to walk in hospital room?: A Lot Help needed climbing 3-5 steps with a railing? : Total 6 Click Score: 14    End of Session   Activity Tolerance: Patient tolerated treatment well;Patient limited by fatigue Patient left: in chair;with call bell/phone within reach Nurse Communication: Mobility status PT Visit Diagnosis: Unsteadiness on feet (R26.81);Other abnormalities of gait and mobility (R26.89);Muscle weakness (generalized) (M62.81)     Time: 0981-1914 PT Time Calculation (min) (ACUTE ONLY): 23 min  Charges:    $Therapeutic Activity: 23-37 mins PT General Charges $$ ACUTE PT VISIT: 1 Visit                     12:38 PM, 01/06/24 Ocie Bob, MPT Physical Therapist with [facility] [phone] office [phone] mobile phone

## 2024-01-06 NOTE — Progress Notes (Signed)
 Gastroenterology Progress Note   Referring Provider: No ref. provider found Primary Care Physician:  Christene Lye, FNP Primary Gastroenterologist:  Dolores Frame, MD   Patient ID: [name]; [id number]; August 21, 1966    Subjective   Feeling well this morning. Denies any abdominal pain, N/V. No BM in 4 days. Is hungry and thirsty and ready for her breakfast.    Objective   Vital signs in last 24 hours Temp:  [97.7 F (36.5 C)-98.2 F (36.8 C)] 98.1 F (36.7 C) (03/13 0804) Pulse Rate:  [67-106] 68 (03/13 0700) Resp:  [10-22] 10 (03/13 0700) BP: (130-168)/(82-116) 157/92 (03/13 0700) SpO2:  [91 %-100 %] 93 % (03/13 0700) Weight:  [76 kg] 76 kg (03/12 1333) Last BM Date : 01/03/24  Physical Exam General:   Alert and oriented, pleasant Head:  Normocephalic and atraumatic. Eyes:  No icterus, sclera clear. Conjuctiva pink.  Abdomen:  Bowel sounds present, soft, non-tender, non-distended. No HSM or hernias noted. No rebound or guarding. No masses appreciated  Extremities:  Without clubbing or edema. Neurologic:  Alert and  oriented x4;  grossly normal neurologically. Skin:  Warm and dry, intact without significant lesions.  Psych:  Alert and cooperative. Normal mood and affect.  Intake/Output from previous day: 03/12 0701 - 03/13 0700 In: 140.4 [I.V.:140.4] Out: 900 [Urine:900] Intake/Output this shift: No intake/output data recorded.  Lab Results  Recent Labs    01/04/24 1524 01/05/24 0309 01/06/24 0454  WBC 7.4 6.2 4.4  HGB 9.7* 8.9* 9.3*  HCT 30.5* 27.7* 29.7*  PLT 184 171 167   BMET Recent Labs    01/04/24 1524 01/05/24 0309 01/06/24 0454  NA 138 139 141  K 3.8 3.5 3.6  CL 105 108 109  CO2 19* 21* 22  GLUCOSE 94 137* 100*  BUN 72* 74* 69*  CREATININE 4.91* 4.57* 3.85*  CALCIUM 9.0 8.7* 8.5*   LFT Recent Labs    01/04/24 1524 01/05/24 0309 01/06/24 0454  PROT 6.7 6.1* 6.0*  ALBUMIN 3.0* 2.8* 2.7*  AST 4,879* 3,883* 1,377*   ALT 3,761* 3,712* 2,342*  ALKPHOS 139* 146* 154*  BILITOT 1.0 1.0 0.7   PT/INR Recent Labs    01/04/24 1816 01/06/24 0454  LABPROT 19.1* 23.1*  INR 1.6* 2.0*   Hepatitis Panel Recent Labs    01/04/24 1816  HEPBSAG NON REACTIVE  HCVAB NON REACTIVE  HEPAIGM NON REACTIVE  HEPBIGM NON REACTIVE    Studies/Results US RENAL Result Date: 01/05/2024 CLINICAL DATA:  Acute kidney injury. EXAM: RENAL / URINARY TRACT ULTRASOUND COMPLETE COMPARISON:  Renal ultrasound from 06/14/2023. FINDINGS: Right Kidney: Renal measurements: 5.1 x 5.1 x 9.2 cm. = volume: 125.1 mL. There is diffuse increased cortical echogenicity. There is mild fullness in the renal collecting system without frank hydronephrosis. No contour deforming mass or stone large enough to cause acoustic shadowing. Left Kidney: Renal measurements: 4.9 x 5.0 x 8.7 cm. = volume: 112.1 mL. There is diffuse increased cortical echogenicity. No hydronephrosis, contour deforming mass or stone large enough to cause acoustic shadowing. Bladder: Appears normal for degree of bladder distention. Other: None. IMPRESSION: *Bilateral kidneys exhibit increased cortical echogenicity, nonspecific but commonly seen with medical renal disease. *Mild fullness in the right renal collecting system without frank hydronephrosis. No left hydronephrosis. Electronically Signed   By: Jules Schick M.D.   On: 01/05/2024 10:33   US Abdomen Limited RUQ (LIVER/GB) Result Date: 01/04/2024 CLINICAL DATA:  Nausea and abdominal pain. EXAM: ULTRASOUND ABDOMEN LIMITED RIGHT UPPER QUADRANT COMPARISON:  None Available. FINDINGS: Gallbladder: There is nonshadowing echogenic content within the gallbladder which may represent tumefactive sludge. No shadowing stone. There is no gallbladder wall thickening or pericholecystic fluid. Negative sonographic Murphy's sign. Common bile duct: Diameter: 14 mm Liver: There is diffuse increased liver echogenicity most commonly seen in the setting  of fatty infiltration. Superimposed inflammation or fibrosis is not excluded. Clinical correlation is recommended. Portal vein is patent on color Doppler imaging with normal direction of blood flow towards the liver. Other: Increased right renal echogenicity. IMPRESSION: 1. Probable tumefactive sludge in the gallbladder. No shadowing stone or sonographic evidence of acute cholecystitis. 2. Fatty liver. 3. Echogenic right kidney suggestive of medical renal disease. Clinical correlation is recommended. Electronically Signed   By: Elgie Collard M.D.   On: 01/04/2024 18:56   DG Chest Port 1 View Result Date: 01/04/2024 CLINICAL DATA:  Weakness. EXAM: PORTABLE CHEST 1 VIEW COMPARISON:  Chest radiograph dated 07/20/2021. FINDINGS: Shallow inspiration. No focal consolidation, pleural effusion, or pneumothorax. Mild cardiomegaly. No acute osseous pathology. IMPRESSION: 1. No active disease. 2. Mild cardiomegaly. Electronically Signed   By: Elgie Collard M.D.   On: 01/04/2024 18:16    Assessment  58 y.o. female with a history of cocaine abuse, alcohol use, CKD, COPD, OSA not on CPAP, diabetes, HTN, A-fib on Eliquis, seizures, stroke, C. difficile, upper GI bleed secondary to grade C esophagitis and duodenal ulcers in the past who presented to the ED earlier this week with generalized weakness for 2 days as well as minimal p.o. intake.  GI consulted given significantly elevated LFTs.  Transaminitis and elevated alkaline phosphatase: -RUQ [name] with CBD measuring 14 mm, probable tumefactive sludge in the gallbladder without shadowing stones or evidence of acute cholecystitis -Acute hepatitis panel negative, Tylenol negative, ethanol negative, ammonia 11 -Presented with AST 4879, ALT 3761, AP 139 and creatinine 4.91 (elevated above baseline) -Ferritin significantly elevated however likely reactant in the setting of illness, iron sats normal.  -CMV, EBV, HSV in process.  HIV negative, IgG negative. -Reportedly  been around a lot of sickness in her care home, at this point ischemia possibly secondary to viral illness could be playing a factor here as well as her renal disease.  Pattern is hepatocellular in nature, less likely biliary although the AP is slightly increasing. -Query some alcohol use given her history, however she reports being a care facility and she denies recent alcohol use. -Drastic improvement of LFTs today (AST 1377, ALT 2342, AP 154).  Creatinine improved to 3.85 -INR 2 (1.6 yesterday) -No signs of HE or jaundice.  Plan / Recommendations  Follow up pending serologies Supportive care Trend LFTs INR tomorrow Avoid hepatotoxic and nephrotoxic medications Monitor for HE Start miralax nightly - (no BM in 4 days)    LOS: 2 days    01/06/2024, 8:22 AM   Brooke Bonito, MSN, FNP-BC, AGACNP-BC [facility]

## 2024-01-06 NOTE — Plan of Care (Signed)

## 2024-01-06 NOTE — Progress Notes (Signed)
 Patient ID: [name], female   DOB: 15-Sep-1966, 58 y.o.   MRN: [id number] [facility] Progress Note   Assessment/ Plan:   1.  Chronic kidney disease stage V: Confirmed with Dr. Wolfgang Phoenix (her nephrologist for the past 13 years) that she was scheduled to have placement of a tunneled dialysis catheter/arteriovenous graft today with recent development of mild uremic symptoms from progression of her renal insufficiency over the past 2 years.  Renal function slightly improved on labs this morning and she does not have any acute/compelling indications for dialysis.  Diuretics on hold and transiently given intravenous fluids.  She does not have any uremic signs or symptoms at this time. 2.  Acute hepatitis: Significant rise of LFTs noted with low index of suspicion for autoimmune hepatitis given her age and ongoing evaluation for infectious etiologies (hepatitis A, B and C testing negative).  Although noted to have a dilated bile duct on ultrasound, not felt to be consistent with biliary hepatitis.  Ongoing evaluation and management by gastroenterology.  LFTs in the morning appear to be trending down. 3.  Anemia: Likely secondary to chronic illness and compounded by recent events.  No indication for PRBC transfusion and appears to have adequate iron stores with significantly elevated ferritin.  Will hold off on ESA therapy at this time. 4.  Hypertension: Blood pressure marginally elevated, continue metoprolol and discontinue intravenous fluids.  Subjective:   Reports to be feeling fair and denies any chest pain, shortness of breath, abnormal limb movements, nausea or vomiting.  Oral intake satisfactory.   Objective:   BP (!) 153/95   Pulse 77   Temp 98.1 F (36.7 C) (Oral)   Resp 14   Ht 5\' 1"  (1.549 m)   Wt 76 kg   LMP 05/19/2016   SpO2 97%   BMI 31.66 kg/m   Intake/Output Summary (Last 24 hours) at 01/06/2024 0931 Last data filed at 01/06/2024 9147 Gross per 24 hour  Intake  938.99 ml  Output 900 ml  Net 38.99 ml   Weight change: -4 kg  Physical Exam: Gen: Comfortable resting in bed, awakens to conversation and is alert/oriented CVS: Pulse regular rhythm, normal rate, S1 and S2 normal Resp: Clear to auscultation, no rales/rhonchi Abd: Soft, flat, nontender, bowel sounds normal Ext: Trace lower extremity edema.  No asterixis.  Symmetrical strength bilateral upper extremities.  Imaging: US RENAL Result Date: 01/05/2024 CLINICAL DATA:  Acute kidney injury. EXAM: RENAL / URINARY TRACT ULTRASOUND COMPLETE COMPARISON:  Renal ultrasound from 06/14/2023. FINDINGS: Right Kidney: Renal measurements: 5.1 x 5.1 x 9.2 cm. = volume: 125.1 mL. There is diffuse increased cortical echogenicity. There is mild fullness in the renal collecting system without frank hydronephrosis. No contour deforming mass or stone large enough to cause acoustic shadowing. Left Kidney: Renal measurements: 4.9 x 5.0 x 8.7 cm. = volume: 112.1 mL. There is diffuse increased cortical echogenicity. No hydronephrosis, contour deforming mass or stone large enough to cause acoustic shadowing. Bladder: Appears normal for degree of bladder distention. Other: None. IMPRESSION: *Bilateral kidneys exhibit increased cortical echogenicity, nonspecific but commonly seen with medical renal disease. *Mild fullness in the right renal collecting system without frank hydronephrosis. No left hydronephrosis. Electronically Signed   By: Jules Schick M.D.   On: 01/05/2024 10:33   US Abdomen Limited RUQ (LIVER/GB) Result Date: 01/04/2024 CLINICAL DATA:  Nausea and abdominal pain. EXAM: ULTRASOUND ABDOMEN LIMITED RIGHT UPPER QUADRANT COMPARISON:  None Available. FINDINGS: Gallbladder: There is nonshadowing echogenic content within the  gallbladder which may represent tumefactive sludge. No shadowing stone. There is no gallbladder wall thickening or pericholecystic fluid. Negative sonographic Murphy's sign. Common bile duct:  Diameter: 14 mm Liver: There is diffuse increased liver echogenicity most commonly seen in the setting of fatty infiltration. Superimposed inflammation or fibrosis is not excluded. Clinical correlation is recommended. Portal vein is patent on color Doppler imaging with normal direction of blood flow towards the liver. Other: Increased right renal echogenicity. IMPRESSION: 1. Probable tumefactive sludge in the gallbladder. No shadowing stone or sonographic evidence of acute cholecystitis. 2. Fatty liver. 3. Echogenic right kidney suggestive of medical renal disease. Clinical correlation is recommended. Electronically Signed   By: Elgie Collard M.D.   On: 01/04/2024 18:56   DG Chest Port 1 View Result Date: 01/04/2024 CLINICAL DATA:  Weakness. EXAM: PORTABLE CHEST 1 VIEW COMPARISON:  Chest radiograph dated 07/20/2021. FINDINGS: Shallow inspiration. No focal consolidation, pleural effusion, or pneumothorax. Mild cardiomegaly. No acute osseous pathology. IMPRESSION: 1. No active disease. 2. Mild cardiomegaly. Electronically Signed   By: Elgie Collard M.D.   On: 01/04/2024 18:16    Labs: BMET Recent Labs  Lab 01/04/24 1524 01/05/24 0309 01/06/24 0454  NA 138 139 141  K 3.8 3.5 3.6  CL 105 108 109  CO2 19* 21* 22  GLUCOSE 94 137* 100*  BUN 72* 74* 69*  CREATININE 4.91* 4.57* 3.85*  CALCIUM 9.0 8.7* 8.5*  PHOS  --  4.6  --    CBC Recent Labs  Lab 01/04/24 1524 01/05/24 0309 01/06/24 0454  WBC 7.4 6.2 4.4  NEUTROABS 5.2  --   --   HGB 9.7* 8.9* 9.3*  HCT 30.5* 27.7* 29.7*  MCV 93.0 93.6 93.4  PLT 184 171 167    Medications:     apixaban  5 mg Oral BID   Chlorhexidine Gluconate Cloth  6 each Topical Q0600   feeding supplement  237 mL Oral BID BM   levETIRAcetam  500 mg Oral BID   metoprolol succinate  25 mg Oral Daily   polyethylene glycol  17 g Oral Daily   Zetta Bills, MD 01/06/2024, 9:31 AM

## 2024-01-06 NOTE — Progress Notes (Signed)
 PROGRESS NOTE   [name]  ZOX:[id number] DOB: June 04, 1966 DOA: 01/04/2024 PCP: Christene Lye, FNP   Chief Complaint  Patient presents with   Weakness   Level of care: Telemetry  Brief Admission History:  58 y.o. 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  She denies chest pain, shortness of breath, nausea, vomiting. She was noted to have AKI on CKD and acute hepatitis of undetermined cause with markedly elevated LFTs.    Assessment and Plan:  Acute transaminitis  - marked rise in LFTs now starting to trend down  - acute hepatitis labs negative - GI consulted, working up for AIH - follow daily LFTs  AKI on stage IV CKD  - has not yet started dialysis - nephrology consulted  - follow daily labs  - renal function slightly improved today - IV fluid discontinued  Type 2 DM controlled - as evidenced by A1c of 6.1%  - diet controlled for now  PAF - resumed home apixaban, metoprolol  Epilepsy - resumed home keppra  Essential hypertension - resume home metoprolol - IV fluid discontinued   Hyperlipidemia  - temporarily holding home rosuvastatin given marked elevation in LFTs   DVT prophylaxis: apixaban Code Status: full  Family Communication: son updated telephone 3/13 Disposition: home    Consultants:  GI Nephrology   Procedures:   Antimicrobials:    Subjective: Pt reports she is starting to feel stronger and appetite is better today.  No abdominal pain, no emesis.  Objective: Vitals:   01/06/24 0800 01/06/24 0804 01/06/24 0930 01/06/24 1000  BP: (!) 153/95  (!) 161/92 (!) 141/95  Pulse: 77  79 94  Resp: 14  13 13   Temp:  98.1 F (36.7 C)    TempSrc:  Oral    SpO2:  97%  96% 95%  Weight:      Height:        Intake/Output Summary (Last 24 hours) at 01/06/2024 1055 Last data filed at 01/06/2024 0923 Gross per 24 hour  Intake 938.99 ml  Output 900 ml  Net 38.99 ml   Filed Weights   01/04/24 1522 01/05/24 1333  Weight: 80 kg 76 kg   Examination:  General exam: Appears calm and comfortable  Respiratory system: Clear to auscultation. Respiratory effort normal. Cardiovascular system: normal S1 & S2 heard. No JVD, murmurs, rubs, gallops or clicks. No pedal edema. Gastrointestinal system: Abdomen is nondistended, soft and nontender. No organomegaly or masses felt. Normal bowel sounds heard. Central nervous system: Alert and oriented. No focal neurological deficits. Extremities: Symmetric 5 x 5 power. Skin: No rashes, lesions or ulcers. Psychiatry: Judgement and insight appear normal. Mood & affect appropriate.   Data Reviewed: I have personally reviewed following labs and imaging studies  CBC: Recent Labs  Lab 01/04/24 1524 01/05/24 0309 01/06/24 0454  WBC 7.4 6.2 4.4  NEUTROABS 5.2  --   --   HGB 9.7* 8.9* 9.3*  HCT 30.5* 27.7* 29.7*  MCV 93.0 93.6 93.4  PLT 184 171 167    Basic Metabolic Panel: Recent Labs  Lab 01/04/24 1524 01/05/24 0309 01/06/24 0454  NA 138 139 141  K 3.8 3.5 3.6  CL 105 108 109  CO2 19* 21* 22  GLUCOSE 94 137* 100*  BUN 72* 74*  69*  CREATININE 4.91* 4.57* 3.85*  CALCIUM 9.0 8.7* 8.5*  MG  --  1.9  --   PHOS  --  4.6  --     CBG: No results for input(s): "GLUCAP" in the last 168 hours.  Recent Results (from the past 240 hours)  Resp panel by RT-PCR (RSV, Flu A&B, Covid) Anterior Nasal Swab     Status: None   Collection Time: 01/04/24  4:13 PM   Specimen: Anterior Nasal Swab  Result Value Ref Range Status   SARS Coronavirus 2 by RT PCR NEGATIVE NEGATIVE Final    Comment: (NOTE) SARS-CoV-2 target nucleic acids are NOT DETECTED.  The SARS-CoV-2 RNA is generally detectable in upper  respiratory specimens during the acute phase of infection. The lowest concentration of SARS-CoV-2 viral copies this assay can detect is 138 copies/mL. A negative result does not preclude SARS-Cov-2 infection and should not be used as the sole basis for treatment or other patient management decisions. A negative result may occur with  improper specimen collection/handling, submission of specimen other than nasopharyngeal swab, presence of viral mutation(s) within the areas targeted by this assay, and inadequate number of viral copies(<138 copies/mL). A negative result must be combined with clinical observations, patient history, and epidemiological information. The expected result is Negative.  Fact Sheet for Patients:  BloggerCourse.com  Fact Sheet for Healthcare Providers:  SeriousBroker.it  This test is no t yet approved or cleared by the [place] FDA and  has been authorized for detection and/or diagnosis of SARS-CoV-2 by FDA under an Emergency Use Authorization (EUA). This EUA will remain  in effect (meaning this test can be used) for the duration of the COVID-19 declaration under Section 564(b)(1) of the Act, 21 U.S.C.section 360bbb-3(b)(1), unless the authorization is terminated  or revoked sooner.       Influenza A by PCR NEGATIVE NEGATIVE Final   Influenza B by PCR NEGATIVE NEGATIVE Final    Comment: (NOTE) The Xpert Xpress SARS-CoV-2/FLU/RSV plus assay is intended as an aid in the diagnosis of influenza from Nasopharyngeal swab specimens and should not be used as a sole basis for treatment. Nasal washings and aspirates are unacceptable for Xpert Xpress SARS-CoV-2/FLU/RSV testing.  Fact Sheet for Patients: BloggerCourse.com  Fact Sheet for Healthcare Providers: SeriousBroker.it  This test is not yet approved or cleared by the [place] FDA and has been  authorized for detection and/or diagnosis of SARS-CoV-2 by FDA under an Emergency Use Authorization (EUA). This EUA will remain in effect (meaning this test can be used) for the duration of the COVID-19 declaration under Section 564(b)(1) of the Act, 21 U.S.C. section 360bbb-3(b)(1), unless the authorization is terminated or revoked.     Resp Syncytial Virus by PCR NEGATIVE NEGATIVE Final    Comment: (NOTE) Fact Sheet for Patients: BloggerCourse.com  Fact Sheet for Healthcare Providers: SeriousBroker.it  This test is not yet approved or cleared by the [place] FDA and has been authorized for detection and/or diagnosis of SARS-CoV-2 by FDA under an Emergency Use Authorization (EUA). This EUA will remain in effect (meaning this test can be used) for the duration of the COVID-19 declaration under Section 564(b)(1) of the Act, 21 U.S.C. section 360bbb-3(b)(1), unless the authorization is terminated or revoked.  Performed at [facility], [address] [place], [place] [address]   MRSA Next Gen by PCR, Nasal     Status: None   Collection Time: 01/05/24 12:25 PM   Specimen: Nasal Mucosa; Nasal Swab  Result Value  Ref Range Status   MRSA by PCR Next Gen NOT DETECTED NOT DETECTED Final    Comment: (NOTE) The GeneXpert MRSA Assay (FDA approved for NASAL specimens only), is one component of a comprehensive MRSA colonization surveillance program. It is not intended to diagnose MRSA infection nor to guide or monitor treatment for MRSA infections. Test performance is not FDA approved in patients less than 50 years old. Performed at [facility], [address] [place], [place] [address]      Radiology Studies: US RENAL Result Date: 01/05/2024 CLINICAL DATA:  Acute kidney injury. EXAM: RENAL / URINARY TRACT ULTRASOUND COMPLETE COMPARISON:  Renal ultrasound from 06/14/2023. FINDINGS: Right Kidney: Renal measurements: 5.1 x 5.1 x 9.2  cm. = volume: 125.1 mL. There is diffuse increased cortical echogenicity. There is mild fullness in the renal collecting system without frank hydronephrosis. No contour deforming mass or stone large enough to cause acoustic shadowing. Left Kidney: Renal measurements: 4.9 x 5.0 x 8.7 cm. = volume: 112.1 mL. There is diffuse increased cortical echogenicity. No hydronephrosis, contour deforming mass or stone large enough to cause acoustic shadowing. Bladder: Appears normal for degree of bladder distention. Other: None. IMPRESSION: *Bilateral kidneys exhibit increased cortical echogenicity, nonspecific but commonly seen with medical renal disease. *Mild fullness in the right renal collecting system without frank hydronephrosis. No left hydronephrosis. Electronically Signed   By: Jules Schick M.D.   On: 01/05/2024 10:33   US Abdomen Limited RUQ (LIVER/GB) Result Date: 01/04/2024 CLINICAL DATA:  Nausea and abdominal pain. EXAM: ULTRASOUND ABDOMEN LIMITED RIGHT UPPER QUADRANT COMPARISON:  None Available. FINDINGS: Gallbladder: There is nonshadowing echogenic content within the gallbladder which may represent tumefactive sludge. No shadowing stone. There is no gallbladder wall thickening or pericholecystic fluid. Negative sonographic Murphy's sign. Common bile duct: Diameter: 14 mm Liver: There is diffuse increased liver echogenicity most commonly seen in the setting of fatty infiltration. Superimposed inflammation or fibrosis is not excluded. Clinical correlation is recommended. Portal vein is patent on color Doppler imaging with normal direction of blood flow towards the liver. Other: Increased right renal echogenicity. IMPRESSION: 1. Probable tumefactive sludge in the gallbladder. No shadowing stone or sonographic evidence of acute cholecystitis. 2. Fatty liver. 3. Echogenic right kidney suggestive of medical renal disease. Clinical correlation is recommended. Electronically Signed   By: Elgie Collard M.D.   On:  01/04/2024 18:56   DG Chest Port 1 View Result Date: 01/04/2024 CLINICAL DATA:  Weakness. EXAM: PORTABLE CHEST 1 VIEW COMPARISON:  Chest radiograph dated 07/20/2021. FINDINGS: Shallow inspiration. No focal consolidation, pleural effusion, or pneumothorax. Mild cardiomegaly. No acute osseous pathology. IMPRESSION: 1. No active disease. 2. Mild cardiomegaly. Electronically Signed   By: Elgie Collard M.D.   On: 01/04/2024 18:16    Scheduled Meds:  apixaban  5 mg Oral BID   Chlorhexidine Gluconate Cloth  6 each Topical Q0600   feeding supplement  237 mL Oral BID BM   levETIRAcetam  500 mg Oral BID   metoprolol succinate  25 mg Oral Daily   polyethylene glycol  17 g Oral Daily   Continuous Infusions:   LOS: 2 days   Time spent: 55 mins  Estell Dillinger Laural Benes, MD How to contact the [facility] Attending or Consulting provider 7A - 7P or covering provider during after hours 7P -7A, for this patient?  Check the care team in [facility] and look for a) attending/consulting [facility] provider listed and b) the [facility] team listed Log into [email or web address] to find provider on call.  Locate  the [facility] provider you are looking for under [facility] and page to a number that you can be directly reached. If you still have difficulty reaching the provider, please page the [facility] (Director on Call) for the Hospitalists listed on amion for assistance.  01/06/2024, 10:55 AM

## 2024-01-07 ENCOUNTER — Telehealth: Payer: Self-pay | Admitting: Gastroenterology

## 2024-01-07 ENCOUNTER — Inpatient Hospital Stay (HOSPITAL_COMMUNITY)

## 2024-01-07 DIAGNOSIS — E782 Mixed hyperlipidemia: Secondary | ICD-10-CM | POA: Diagnosis not present

## 2024-01-07 DIAGNOSIS — R7401 Elevation of levels of liver transaminase levels: Secondary | ICD-10-CM | POA: Diagnosis not present

## 2024-01-07 DIAGNOSIS — I480 Paroxysmal atrial fibrillation: Secondary | ICD-10-CM | POA: Diagnosis not present

## 2024-01-07 DIAGNOSIS — K828 Other specified diseases of gallbladder: Secondary | ICD-10-CM | POA: Diagnosis not present

## 2024-01-07 DIAGNOSIS — N185 Chronic kidney disease, stage 5: Secondary | ICD-10-CM | POA: Diagnosis not present

## 2024-01-07 LAB — COMPREHENSIVE METABOLIC PANEL
ALT: 1586 U/L — ABNORMAL HIGH (ref 0–44)
AST: 574 U/L — ABNORMAL HIGH (ref 15–41)
Albumin: 2.6 g/dL — ABNORMAL LOW (ref 3.5–5.0)
Alkaline Phosphatase: 144 U/L — ABNORMAL HIGH (ref 38–126)
Anion gap: 10 (ref 5–15)
BUN: 65 mg/dL — ABNORMAL HIGH (ref 6–20)
CO2: 20 mmol/L — ABNORMAL LOW (ref 22–32)
Calcium: 8.5 mg/dL — ABNORMAL LOW (ref 8.9–10.3)
Chloride: 110 mmol/L (ref 98–111)
Creatinine, Ser: 3.65 mg/dL — ABNORMAL HIGH (ref 0.44–1.00)
GFR, Estimated: 14 mL/min — ABNORMAL LOW (ref >60)
Glucose, Bld: 100 mg/dL — ABNORMAL HIGH (ref 70–99)
Potassium: 3.7 mmol/L (ref 3.5–5.1)
Sodium: 140 mmol/L (ref 135–145)
Total Bilirubin: 0.5 mg/dL (ref 0.0–1.2)
Total Protein: 5.8 g/dL — ABNORMAL LOW (ref 6.5–8.1)

## 2024-01-07 LAB — CBC
HCT: 29 % — ABNORMAL LOW (ref 36.0–46.0)
Hemoglobin: 9.2 g/dL — ABNORMAL LOW (ref 12.0–15.0)
MCH: 29.7 pg (ref 26.0–34.0)
MCHC: 31.7 g/dL (ref 30.0–36.0)
MCV: 93.5 fL (ref 80.0–100.0)
Platelets: 190 10*3/uL (ref 150–400)
RBC: 3.1 MIL/uL — ABNORMAL LOW (ref 3.87–5.11)
RDW: 14 % (ref 11.5–15.5)
WBC: 5.3 10*3/uL (ref 4.0–10.5)
nRBC: 0 % (ref 0.0–0.2)

## 2024-01-07 LAB — HSV(HERPES SIMPLEX VRS) I + II AB-IGG
HSV 1 Glycoprotein G Ab, IgG: REACTIVE — AB
HSV 2 Glycoprotein G Ab, IgG: REACTIVE — AB

## 2024-01-07 LAB — PROTIME-INR
INR: 1.8 — ABNORMAL HIGH (ref 0.8–1.2)
Prothrombin Time: 20.9 s — ABNORMAL HIGH (ref 11.4–15.2)

## 2024-01-07 LAB — PHOSPHORUS: Phosphorus: 3.2 mg/dL (ref 2.5–4.6)

## 2024-01-07 LAB — MAGNESIUM: Magnesium: 1.8 mg/dL (ref 1.7–2.4)

## 2024-01-07 MED ORDER — MAGNESIUM SULFATE 4 GM/100ML IV SOLN
4.0000 g | Freq: Once | INTRAVENOUS | Status: AC
  Administered 2024-01-07: 4 g via INTRAVENOUS
  Filled 2024-01-07: qty 100

## 2024-01-07 MED ORDER — TORSEMIDE 20 MG PO TABS
20.0000 mg | ORAL_TABLET | Freq: Every day | ORAL | Status: DC
  Administered 2024-01-07 – 2024-01-10 (×4): 20 mg via ORAL
  Filled 2024-01-07 (×4): qty 1

## 2024-01-07 NOTE — Progress Notes (Signed)
 Patient ID: [name], female   DOB: 1966-06-03, 58 y.o.   MRN: [id number] [facility] Progress Note   Assessment/ Plan:   1.  Chronic kidney disease stage V: Diuretic (furosemide 40 mg twice daily) held upon admission with efforts at gentle intravenous volume expansion.  Currently with some improvement of renal function and without any acute indications to initiate dialysis.  As an outpatient, she was being teed up to begin dialysis with worsening renal function and early development of uremic symptoms recognized by Dr. Wolfgang Phoenix; this will be deferred to a later date and pursued upon discharge.  At this time, I will restart torsemide 20 mg daily (elected over furosemide with her low albumin). 2.  Acute hepatitis: Significant rise of LFTs noted with low index of suspicion for autoimmune hepatitis given her age and ongoing evaluation for infectious etiologies (hepatitis A, B and C testing negative).  Although noted to have a dilated bile duct on ultrasound, not felt to be consistent with biliary hepatitis.  Her transaminases continue to trend down and she does not have hyperbilirubinemia.  Improving with conservative management/observation. 3.  Anemia: Likely secondary to chronic illness and compounded by recent events.  No indication for PRBC transfusion and appears to have adequate iron stores with significantly elevated ferritin.  Will hold off on ESA therapy at this time. 4.  Hypertension: Blood pressure marginally elevated, continue metoprolol and discontinue intravenous fluids.  Subjective:   Reports to be thirsty this morning and would like some cold water to drink.  Denies any chest pain or shortness of breath and does not have any nausea, vomiting, dysgeusia or abnormal limb jerking movements.   Objective:   BP (!) 132/91 (BP Location: Right Arm)   Pulse 71   Temp 98.6 F (37 C) (Oral)   Resp 19   Ht 5\' 1"  (1.549 m)   Wt 76.1 kg   LMP 05/19/2016   SpO2 100%   BMI  31.70 kg/m   Intake/Output Summary (Last 24 hours) at 01/07/2024 4098 Last data filed at 01/07/2024 0420 Gross per 24 hour  Intake 1132.99 ml  Output 200 ml  Net 932.99 ml   Weight change: 0.1 kg  Physical Exam: Gen: Appears comfortable resting in bed, breakfast at bedside.  Alert/oriented CVS: Pulse regular rhythm, normal rate, S1 and S2 normal Resp: Clear to auscultation, no rales/rhonchi Abd: Soft, flat, nontender, bowel sounds normal Ext: Trace lower extremity edema.  No asterixis.  Symmetrical strength bilateral upper extremities.  Imaging: No results found.   Labs: BMET Recent Labs  Lab 01/04/24 1524 01/05/24 0309 01/06/24 0454 01/07/24 0444  NA 138 139 141 140  K 3.8 3.5 3.6 3.7  CL 105 108 109 110  CO2 19* 21* 22 20*  GLUCOSE 94 137* 100* 100*  BUN 72* 74* 69* 65*  CREATININE 4.91* 4.57* 3.85* 3.65*  CALCIUM 9.0 8.7* 8.5* 8.5*  PHOS  --  4.6  --  3.2   CBC Recent Labs  Lab 01/04/24 1524 01/05/24 0309 01/06/24 0454 01/07/24 0444  WBC 7.4 6.2 4.4 5.3  NEUTROABS 5.2  --   --   --   HGB 9.7* 8.9* 9.3* 9.2*  HCT 30.5* 27.7* 29.7* 29.0*  MCV 93.0 93.6 93.4 93.5  PLT 184 171 167 190    Medications:     apixaban  5 mg Oral BID   Chlorhexidine Gluconate Cloth  6 each Topical Q0600   feeding supplement  237 mL Oral BID BM  levETIRAcetam  500 mg Oral BID   metoprolol succinate  25 mg Oral Daily   polyethylene glycol  17 g Oral Daily   Zetta Bills, MD 01/07/2024, 8:12 AM

## 2024-01-07 NOTE — Progress Notes (Signed)
 Gastroenterology Progress Note   Referring Provider: No ref. provider found Primary Care Physician:  Christene Lye, FNP Primary Gastroenterologist: Dolores Frame, MD  Patient ID: [name]; [id number]; 1965/11/06    Subjective   Feeling well. Denies abdominal pain, pruritus, N/V, dysphagia, diarrhea, chest pain, shortness of breath. Tolerating diet although did not like her breakfast this morning.   No BM yet.    Objective   Vital signs in last 24 hours Temp:  [97.9 F (36.6 C)-98.6 F (37 C)] 98.6 F (37 C) (03/14 0420) Pulse Rate:  [70-98] 71 (03/14 1031) Resp:  [13-19] 19 (03/14 0420) BP: (120-155)/(82-108) 132/91 (03/14 1031) SpO2:  [93 %-100 %] 100 % (03/14 0420) Weight:  [76.1 kg] 76.1 kg (03/14 0500) Last BM Date : 01/04/24  Physical Exam General:   Alert and oriented, pleasant Head:  Normocephalic and atraumatic. Eyes:  No icterus, sclera clear. Conjuctiva pink.  Mouth:  Without lesions, mucosa pink and moist.  Lungs: Clear to auscultation bilaterally, without wheezing, rales, or rhonchi.  Abdomen: soft, non-tender, non-distended. No HSM or hernias noted. No rebound or guarding. No masses appreciated  Neurologic:  Alert and  oriented x4;  grossly normal neurologically. Psych:  Alert and cooperative. Normal mood and affect.  Intake/Output from previous day: 03/13 0701 - 03/14 0700 In: 1133 [P.O.:440; I.V.:647.1; IV Piggyback:45.9] Out: 200 [Urine:200] Intake/Output this shift: Total I/O In: 240 [P.O.:240] Out: -   Lab Results  Recent Labs    01/05/24 0309 01/06/24 0454 01/07/24 0444  WBC 6.2 4.4 5.3  HGB 8.9* 9.3* 9.2*  HCT 27.7* 29.7* 29.0*  PLT 171 167 190   BMET Recent Labs    01/05/24 0309 01/06/24 0454 01/07/24 0444  NA 139 141 140  K 3.5 3.6 3.7  CL 108 109 110  CO2 21* 22 20*  GLUCOSE 137* 100* 100*  BUN 74* 69* 65*  CREATININE 4.57* 3.85* 3.65*  CALCIUM 8.7* 8.5* 8.5*   LFT Recent Labs    01/05/24 0309  01/06/24 0454 01/07/24 0444  PROT 6.1* 6.0* 5.8*  ALBUMIN 2.8* 2.7* 2.6*  AST 3,883* 1,377* 574*  ALT 3,712* 2,342* 1,586*  ALKPHOS 146* 154* 144*  BILITOT 1.0 0.7 0.5   PT/INR Recent Labs    01/06/24 0454 01/07/24 0444  LABPROT 23.1* 20.9*  INR 2.0* 1.8*   Hepatitis Panel Recent Labs    01/04/24 1816  HEPBSAG NON REACTIVE  HCVAB NON REACTIVE  HEPAIGM NON REACTIVE  HEPBIGM NON REACTIVE    Studies/Results US LIVER DOPPLER Result Date: 01/07/2024 CLINICAL DATA:  Elevated liver function tests. EXAM: DUPLEX ULTRASOUND OF LIVER TECHNIQUE: Color and duplex Doppler ultrasound was performed to evaluate the hepatic in-flow and out-flow vessels. COMPARISON:  Right upper quadrant abdominal ultrasound on 01/04/2024 FINDINGS: Liver: The liver demonstrates coarse echotexture and increased echogenicity, likely reflecting diffuse steatosis. No overt cirrhotic contour abnormalities or focal lesions are identified. There is no evidence of intrahepatic biliary ductal dilatation. Main Portal Vein size: 0.9 cm Portal Vein Velocities Main Prox:  54 cm/sec Main Mid: 55 cm/sec Main Dist:  32 cm/sec Right: 41 cm/sec Left: 30 cm/sec Hepatic Vein Velocities Right:  32 cm/sec Middle:  20 cm/sec Left:  40 cm/sec IVC: Present and patent with normal respiratory phasicity. Hepatic Artery Velocity:  61 cm/sec Splenic Vein Velocity:  36 cm/sec Spleen: 10 cm x 10 cm x 3.1 cm with a total volume of 163 cm^3 (411 cm^3 is upper limit normal) Portal Vein Occlusion/Thrombus: No Splenic Vein Occlusion/Thrombus: No  Ascites: None Varices: None No evidence of portal vein thrombus. Direction of portal vein flow is towards the liver. Portal vein waveforms are normal. Hepatic veins are patent with no evidence to suggest hepatic veno-occlusive disease. Hepatic vein waveforms are normal. The splenic vein is patent. Hepatic artery demonstrates normal low resistance waveform. IMPRESSION: 1. Probable diffuse hepatic steatosis. No overt  cirrhotic contour abnormalities or focal lesions. 2. Normal hepatic duplex ultrasound. No evidence of portal hypertension or hepatic veno-occlusive disease. Electronically Signed   By: Irish Lack M.D.   On: 01/07/2024 09:43   US RENAL Result Date: 01/05/2024 CLINICAL DATA:  Acute kidney injury. EXAM: RENAL / URINARY TRACT ULTRASOUND COMPLETE COMPARISON:  Renal ultrasound from 06/14/2023. FINDINGS: Right Kidney: Renal measurements: 5.1 x 5.1 x 9.2 cm. = volume: 125.1 mL. There is diffuse increased cortical echogenicity. There is mild fullness in the renal collecting system without frank hydronephrosis. No contour deforming mass or stone large enough to cause acoustic shadowing. Left Kidney: Renal measurements: 4.9 x 5.0 x 8.7 cm. = volume: 112.1 mL. There is diffuse increased cortical echogenicity. No hydronephrosis, contour deforming mass or stone large enough to cause acoustic shadowing. Bladder: Appears normal for degree of bladder distention. Other: None. IMPRESSION: *Bilateral kidneys exhibit increased cortical echogenicity, nonspecific but commonly seen with medical renal disease. *Mild fullness in the right renal collecting system without frank hydronephrosis. No left hydronephrosis. Electronically Signed   By: Jules Schick M.D.   On: 01/05/2024 10:33   US Abdomen Limited RUQ (LIVER/GB) Result Date: 01/04/2024 CLINICAL DATA:  Nausea and abdominal pain. EXAM: ULTRASOUND ABDOMEN LIMITED RIGHT UPPER QUADRANT COMPARISON:  None Available. FINDINGS: Gallbladder: There is nonshadowing echogenic content within the gallbladder which may represent tumefactive sludge. No shadowing stone. There is no gallbladder wall thickening or pericholecystic fluid. Negative sonographic Murphy's sign. Common bile duct: Diameter: 14 mm Liver: There is diffuse increased liver echogenicity most commonly seen in the setting of fatty infiltration. Superimposed inflammation or fibrosis is not excluded. Clinical correlation is  recommended. Portal vein is patent on color Doppler imaging with normal direction of blood flow towards the liver. Other: Increased right renal echogenicity. IMPRESSION: 1. Probable tumefactive sludge in the gallbladder. No shadowing stone or sonographic evidence of acute cholecystitis. 2. Fatty liver. 3. Echogenic right kidney suggestive of medical renal disease. Clinical correlation is recommended. Electronically Signed   By: Elgie Collard M.D.   On: 01/04/2024 18:56   DG Chest Port 1 View Result Date: 01/04/2024 CLINICAL DATA:  Weakness. EXAM: PORTABLE CHEST 1 VIEW COMPARISON:  Chest radiograph dated 07/20/2021. FINDINGS: Shallow inspiration. No focal consolidation, pleural effusion, or pneumothorax. Mild cardiomegaly. No acute osseous pathology. IMPRESSION: 1. No active disease. 2. Mild cardiomegaly. Electronically Signed   By: Elgie Collard M.D.   On: 01/04/2024 18:16    Assessment  58 y.o. female with a history of cocaine abuse, alcohol use, CKD, COPD, OSA not on CPAP, diabetes, HTN, A-fib on Eliquis, seizures, stroke, C. difficile, upper GI bleed secondary to grade C esophagitis and duodenal ulcers in the past who presented to the ED earlier this week with generalized weakness for 2 days as well as minimal p.o. intake. GI consulted given significantly elevated LFTs.   Transaminitis, elevated alkaline phosphatase: -RUQ [name] with CBD 14mm  and tumefactive sludge present without shadowing stones or acute cholecystitis.  -Ferritin elevated however liekly reactive given normal iron saturation -Acute hepatitis panel, tylenol leve, CMV, ANA, ASMA, AMA, IgG, HIV negative/wnl. -HSV 1 and 2 IgG + and  EBV IgG + (unclear timing of exposure, unable to rule out recent infection) -History of alcohol and cocaine use, ethanol level negative on admission.  -Continued improvement in LFTs (AST 1377>>, ALT 2342>>, AP 154>>144) -In 1.8 today afer dose of Vitamin K for INR 2 yesterday -US liver doppler with  patient portal and hepatic vein -Given history of cocaine use will obtain UDS today as recommended by Dr. Tasia Catchings -Given her LFTs are down trending nicely, the most likely etiology is ischemia  or drug induced.  -Remains without HE  Plan / Recommendations  Supportive measures UDS Repeat HFP 2 weeks post discharge INR prior to discharge Mediterranean diet Monitor HE Monitor for bleeding Avoid hepatotoxic and nephrotoxic medications Continue miralax daily OV 3-4 weeks   Given significant improvement in LFTs and trending down, GI will sign off and plan to follow up outpatient with rechecking LFTs in 2 weeks and schedule OV. If any HE develops please feel free to reach back out to GI.    LOS: 3 days   01/07/2024, 10:42 AM   Brooke Bonito, MSN, FNP-BC, AGACNP-BC [facility]

## 2024-01-07 NOTE — Plan of Care (Signed)
  Problem: Education: Goal: Knowledge of General Education information will improve Description: Including pain rating scale, medication(s)/side effects and non-pharmacologic comfort measures Outcome: Progressing   Problem: Elimination: Goal: Will not experience complications related to bowel motility Outcome: Progressing   Problem: Pain Managment: Goal: General experience of comfort will improve and/or be controlled Outcome: Progressing   Problem: Safety: Goal: Ability to remain free from injury will improve Outcome: Progressing   Problem: Skin Integrity: Goal: Risk for impaired skin integrity will decrease Outcome: Progressing

## 2024-01-07 NOTE — Telephone Encounter (Signed)
 Pleas arrange HFP in 2 weeks. Will need to fax to [facility] where she resides. Dx: elevated LFTs

## 2024-01-07 NOTE — Progress Notes (Signed)
 Occupational Therapy Treatment Patient Details Name: [name] MRN: [id number] DOB: [birth year] Today's Date: 01/07/2024   History of present illness 58 y.o. 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  She denies chest pain, shortness of breath, nausea, vomiting.   OT comments  Pt agreeable to OT treatment session. Pt completed bed mobility with min assist to manage LE. Completed sit to stand t/f with use of RW and CGA. Pt able to maintain balance while standing, reliant on assistive device. Pt declined further mobility due to just getting back to room after ultrasound.       If plan is discharge home, recommend the following:  Assist for transportation;Help with stairs or ramp for entrance;Assistance with cooking/housework;A little help with bathing/dressing/bathroom;A little help with walking and/or transfers   Equipment Recommendations  None recommended by OT    Recommendations for Other Services      Precautions / Restrictions Precautions Precautions: Fall Recall of Precautions/Restrictions: Intact Restrictions Weight Bearing Restrictions Per Provider Order: No       Mobility Bed Mobility Overal bed mobility: Needs Assistance Bed Mobility: Rolling, Sidelying to Sit Rolling: Min assist Sidelying to sit: Min assist, Mod assist Supine to sit: Min assist, Mod assist Sit to supine: Min assist, Mod assist   General bed mobility comments: fair/good    Transfers Overall transfer level: Needs assistance Equipment used: Rolling walker (2 wheels) Transfers: Sit to/from Stand Sit to Stand: Contact guard assist           General transfer comment: pt using RW and CGA to stand with ease      Balance Overall balance assessment: Needs assistance Sitting-balance support: Feet supported, No upper extremity supported Sitting balance-Leahy Scale: Fair Sitting balance - Comments: fair/good seated at EOB   Standing balance support: Bilateral upper extremity supported, During functional activity, Reliant on assistive device for balance Standing balance-Leahy Scale: Fair                               Extremity/Trunk Assessment Upper Extremity Assessment Upper Extremity Assessment: Overall WFL for tasks assessed                     Communication Communication Communication: No apparent difficulties   Cognition Arousal: Alert Behavior During Therapy: WFL for tasks assessed/performed Cognition: No family/caregiver present to determine baseline             OT - Cognition Comments: appropriate                 Following commands: Intact        Cueing   Cueing Techniques: Verbal cues  Exercises              Pertinent Vitals/ Pain       Pain Assessment Pain Assessment: No/denies pain         Frequency  Min 2X/week        Progress Toward Goals  OT Goals(current goals can now be found in the care plan section)  Progress towards OT goals: Progressing toward goals  ADL Goals Pt Will Perform Grooming: with modified independence Pt Will Perform Upper Body Dressing: with modified independence Pt Will Perform Lower Body Dressing: with  min assist;sitting/lateral leans Pt Will Transfer to Toilet: with modified independence Pt Will Perform Toileting - Clothing Manipulation and hygiene: with modified independence Pt/caregiver will Perform Home Exercise Program: Increased strength;Increased ROM;Independently;Both right and left upper extremity  Plan            End of Session Equipment Utilized During Treatment: Rolling walker (2 wheels)  OT Visit Diagnosis: Unsteadiness on feet (R26.81);Other abnormalities of gait and mobility  (R26.89);Muscle weakness (generalized) (M62.81)   Activity Tolerance Patient tolerated treatment well   Patient Left in bed;with call bell/phone within reach   Nurse Communication          Time: 0950-1005 OT Time Calculation (min): 15 min  Charges: OT General Charges $OT Visit: 1 Visit OT Treatments $Self Care/Home Management : 8-22 mins    Bevelyn Ngo, OTR/L  01/07/2024, 11:28 AM

## 2024-01-07 NOTE — TOC Progression Note (Signed)
 Transition of Care [facility]) - Progression Note    Patient Details  Name: LASHAUN POCH MRN: [id number] Date of Birth: 02-13-1966  [facility]) CM/SW Contact  Villa Herb, [place] Phone Number: 01/07/2024, 10:35 AM  Clinical Narrative:    CSW spoke to [place] with [facility] who states that they are unable to accept pts back over the weekend. Damian Leavell also states that pt was supposed to have a port placed for HD however she is now admitted. Trudy requested that CSW inquire about if this was still needed, CSW reached out to MD. TOC to follow.   Expected Discharge Plan:  (to be determined) Barriers to Discharge: Continued Medical Work up  Expected Discharge Plan and Services In-house Referral: Clinical Social Work     Living arrangements for the past 2 months: Assisted Living Facility                           HH Arranged: PT [facility] Agency: [facility] Date HH Agency Contacted: 01/06/24 Time HH Agency Contacted: 1145 Representative spoke with at [facility] Agency: Clydie Braun   Social Determinants of Health (SDOH) Interventions SDOH Screenings   Food Insecurity: No Food Insecurity (01/04/2024)  Housing: Low Risk  (01/04/2024)  Transportation Needs: No Transportation Needs (01/04/2024)  Utilities: Not At Risk (01/04/2024)  Alcohol Screen: Low Risk  (10/15/2020)  Depression (PHQ2-9): Low Risk  (07/26/2023)  Financial Resource Strain: Medium Risk (10/15/2020)  Physical Activity: Insufficiently Active (10/15/2020)  Social Connections: Moderately Isolated (10/15/2020)  Stress: Stress Concern Present (10/15/2020)  Tobacco Use: Medium Risk (01/06/2024)    Readmission Risk Interventions    01/05/2024    1:38 PM 07/25/2021    9:44 AM  Readmission Risk Prevention Plan  Transportation Screening Complete Complete  Medication Review (RN Care Manager) Complete Complete  PCP or Specialist appointment within 3-5 days of discharge  Complete  HRI or Home Care Consult Complete  Complete  SW Recovery Care/Counseling Consult Complete Complete  Palliative Care Screening Not Applicable Not Applicable  Skilled Nursing Facility -- Complete

## 2024-01-07 NOTE — Plan of Care (Signed)

## 2024-01-07 NOTE — Progress Notes (Signed)
 PROGRESS NOTE   STARLETTE THUROW  ZOX:[id number] DOB: 1966/07/18 DOA: 01/04/2024 PCP: Christene Lye, FNP   Chief Complaint  Patient presents with   Weakness   Level of care: Telemetry  Brief Admission History:  58 y.o. female with medical history significant of combined systolic and diastolic CHF with recovered EF, hypertension, A-fib, seizure, T2DM, hyperlipidemia who presents to the emergency department from [facility] assisted living facility via EMS due to 2-day onset of generalized weakness and minimal oral intake minimal oral intake.  She denies chest pain, shortness of breath, nausea, vomiting. She was noted to have AKI on CKD and acute hepatitis of undetermined cause with markedly elevated LFTs.    Assessment and Plan:  Acute transaminitis  - marked rise in LFTs now starting to trend down  - acute hepatitis labs negative - GI consulted, working up for AIH - outpatient follow up with [facility] GI for repeat labs, etc in 2 weeks  AKI on stage IV CKD  - has not yet started dialysis, cath placement to be arranged outpatient by Dr. Wolfgang Phoenix - nephrology consulted  - follow daily labs  - renal function slightly improved - IV fluid discontinued  Type 2 DM controlled - as evidenced by A1c of 6.1%  - diet controlled for now  PAF - resumed home apixaban, metoprolol  Epilepsy - resumed home keppra  Essential hypertension - resume home metoprolol - IV fluid discontinued   Hyperlipidemia  - temporarily holding home rosuvastatin given marked elevation in LFTs   DVT prophylaxis: apixaban Code Status: full  Family Communication: son updated telephone 3/13 Disposition: home with [facility] on 3/15 if continues to improve    Consultants:  GI Nephrology   Procedures:   Antimicrobials:    Subjective: No abdominal pain and tolerating diet well.   Objective: Vitals:   01/07/24 0500 01/07/24 1031 01/07/24 1100 01/07/24 1344  BP:  (!) 132/91  131/89  Pulse:  71  73  Resp:     19  Temp:    98.2 F (36.8 C)  TempSrc:      SpO2:    99%  Weight: 76.1 kg     Height:   5\' 1"  (1.549 m)     Intake/Output Summary (Last 24 hours) at 01/07/2024 1426 Last data filed at 01/07/2024 1300 Gross per 24 hour  Intake 585.92 ml  Output 200 ml  Net 385.92 ml   Filed Weights   01/04/24 1522 01/05/24 1333 01/07/24 0500  Weight: 80 kg 76 kg 76.1 kg   Examination:  General exam: Appears calm and comfortable  Respiratory system: Clear to auscultation. Respiratory effort normal. Cardiovascular system: normal S1 & S2 heard. No JVD, murmurs, rubs, gallops or clicks. No pedal edema. Gastrointestinal system: Abdomen is nondistended, soft and nontender. No organomegaly or masses felt. Normal bowel sounds heard. Central nervous system: Alert and oriented. No focal neurological deficits. Extremities: Symmetric 5 x 5 power. Skin: No rashes, lesions or ulcers. Psychiatry: Judgement and insight appear diminished. Mood & affect appropriate.   Data Reviewed: I have personally reviewed following labs and imaging studies  CBC: Recent Labs  Lab 01/04/24 1524 01/05/24 0309 01/06/24 0454 01/07/24 0444  WBC 7.4 6.2 4.4 5.3  NEUTROABS 5.2  --   --   --   HGB 9.7* 8.9* 9.3* 9.2*  HCT 30.5* 27.7* 29.7* 29.0*  MCV 93.0 93.6 93.4 93.5  PLT 184 171 167 190    Basic Metabolic Panel: Recent Labs  Lab 01/04/24 1524 01/05/24 0309  01/06/24 0454 01/07/24 0444  NA 138 139 141 140  K 3.8 3.5 3.6 3.7  CL 105 108 109 110  CO2 19* 21* 22 20*  GLUCOSE 94 137* 100* 100*  BUN 72* 74* 69* 65*  CREATININE 4.91* 4.57* 3.85* 3.65*  CALCIUM 9.0 8.7* 8.5* 8.5*  MG  --  1.9  --  1.8  PHOS  --  4.6  --  3.2    CBG: No results for input(s): "GLUCAP" in the last 168 hours.  Recent Results (from the past 240 hours)  Resp panel by RT-PCR (RSV, Flu A&B, Covid) Anterior Nasal Swab     Status: None   Collection Time: 01/04/24  4:13 PM   Specimen: Anterior Nasal Swab  Result Value Ref Range  Status   SARS Coronavirus 2 by RT PCR NEGATIVE NEGATIVE Final    Comment: (NOTE) SARS-CoV-2 target nucleic acids are NOT DETECTED.  The SARS-CoV-2 RNA is generally detectable in upper respiratory specimens during the acute phase of infection. The lowest concentration of SARS-CoV-2 viral copies this assay can detect is 138 copies/mL. A negative result does not preclude SARS-Cov-2 infection and should not be used as the sole basis for treatment or other patient management decisions. A negative result may occur with  improper specimen collection/handling, submission of specimen other than nasopharyngeal swab, presence of viral mutation(s) within the areas targeted by this assay, and inadequate number of viral copies(<138 copies/mL). A negative result must be combined with clinical observations, patient history, and epidemiological information. The expected result is Negative.  Fact Sheet for Patients:  BloggerCourse.com  Fact Sheet for Healthcare Providers:  SeriousBroker.it  This test is no t yet approved or cleared by the [place] FDA and  has been authorized for detection and/or diagnosis of SARS-CoV-2 by FDA under an Emergency Use Authorization (EUA). This EUA will remain  in effect (meaning this test can be used) for the duration of the COVID-19 declaration under Section 564(b)(1) of the Act, 21 U.S.C.section 360bbb-3(b)(1), unless the authorization is terminated  or revoked sooner.       Influenza A by PCR NEGATIVE NEGATIVE Final   Influenza B by PCR NEGATIVE NEGATIVE Final    Comment: (NOTE) The Xpert Xpress SARS-CoV-2/FLU/RSV plus assay is intended as an aid in the diagnosis of influenza from Nasopharyngeal swab specimens and should not be used as a sole basis for treatment. Nasal washings and aspirates are unacceptable for Xpert Xpress SARS-CoV-2/FLU/RSV testing.  Fact Sheet for  Patients: BloggerCourse.com  Fact Sheet for Healthcare Providers: SeriousBroker.it  This test is not yet approved or cleared by the [place] FDA and has been authorized for detection and/or diagnosis of SARS-CoV-2 by FDA under an Emergency Use Authorization (EUA). This EUA will remain in effect (meaning this test can be used) for the duration of the COVID-19 declaration under Section 564(b)(1) of the Act, 21 U.S.C. section 360bbb-3(b)(1), unless the authorization is terminated or revoked.     Resp Syncytial Virus by PCR NEGATIVE NEGATIVE Final    Comment: (NOTE) Fact Sheet for Patients: BloggerCourse.com  Fact Sheet for Healthcare Providers: SeriousBroker.it  This test is not yet approved or cleared by the [place] FDA and has been authorized for detection and/or diagnosis of SARS-CoV-2 by FDA under an Emergency Use Authorization (EUA). This EUA will remain in effect (meaning this test can be used) for the duration of the COVID-19 declaration under Section 564(b)(1) of the Act, 21 U.S.C. section 360bbb-3(b)(1), unless the authorization is terminated or revoked.  Performed at [facility], [address], [place], [place] [address]   MRSA Next Gen by PCR, Nasal     Status: None   Collection Time: 01/05/24 12:25 PM   Specimen: Nasal Mucosa; Nasal Swab  Result Value Ref Range Status   MRSA by PCR Next Gen NOT DETECTED NOT DETECTED Final    Comment: (NOTE) The GeneXpert MRSA Assay (FDA approved for NASAL specimens only), is one component of a comprehensive MRSA colonization surveillance program. It is not intended to diagnose MRSA infection nor to guide or monitor treatment for MRSA infections. Test performance is not FDA approved in patients less than 45 years old. Performed at [facility], [address] [place], [place] [address]      Radiology Studies: US LIVER  DOPPLER Result Date: 01/07/2024 CLINICAL DATA:  Elevated liver function tests. EXAM: DUPLEX ULTRASOUND OF LIVER TECHNIQUE: Color and duplex Doppler ultrasound was performed to evaluate the hepatic in-flow and out-flow vessels. COMPARISON:  Right upper quadrant abdominal ultrasound on 01/04/2024 FINDINGS: Liver: The liver demonstrates coarse echotexture and increased echogenicity, likely reflecting diffuse steatosis. No overt cirrhotic contour abnormalities or focal lesions are identified. There is no evidence of intrahepatic biliary ductal dilatation. Main Portal Vein size: 0.9 cm Portal Vein Velocities Main Prox:  54 cm/sec Main Mid: 55 cm/sec Main Dist:  32 cm/sec Right: 41 cm/sec Left: 30 cm/sec Hepatic Vein Velocities Right:  32 cm/sec Middle:  20 cm/sec Left:  40 cm/sec IVC: Present and patent with normal respiratory phasicity. Hepatic Artery Velocity:  61 cm/sec Splenic Vein Velocity:  36 cm/sec Spleen: 10 cm x 10 cm x 3.1 cm with a total volume of 163 cm^3 (411 cm^3 is upper limit normal) Portal Vein Occlusion/Thrombus: No Splenic Vein Occlusion/Thrombus: No Ascites: None Varices: None No evidence of portal vein thrombus. Direction of portal vein flow is towards the liver. Portal vein waveforms are normal. Hepatic veins are patent with no evidence to suggest hepatic veno-occlusive disease. Hepatic vein waveforms are normal. The splenic vein is patent. Hepatic artery demonstrates normal low resistance waveform. IMPRESSION: 1. Probable diffuse hepatic steatosis. No overt cirrhotic contour abnormalities or focal lesions. 2. Normal hepatic duplex ultrasound. No evidence of portal hypertension or hepatic veno-occlusive disease. Electronically Signed   By: Irish Lack M.D.   On: 01/07/2024 09:43    Scheduled Meds:  apixaban  5 mg Oral BID   Chlorhexidine Gluconate Cloth  6 each Topical Q0600   feeding supplement  237 mL Oral BID BM   levETIRAcetam  500 mg Oral BID   metoprolol succinate  25 mg Oral  Daily   polyethylene glycol  17 g Oral Daily   torsemide  20 mg Oral Daily   Continuous Infusions:  magnesium sulfate bolus IVPB      LOS: 3 days   Time spent: 52 mins  Toinette Lackie Laural Benes, MD How to contact the [facility] Attending or Consulting provider 7A - 7P or covering provider during after hours 7P -7A, for this patient?  Check the care team in [facility] and look for a) attending/consulting TRH provider listed and b) the [facility] team listed Log into [email or web address] to find provider on call.  Locate the [facility] provider you are looking for under [facility] and page to a number that you can be directly reached. If you still have difficulty reaching the provider, please page the [facility] (Director on Call) for the Hospitalists listed on amion for assistance.  01/07/2024, 2:26 PM

## 2024-01-07 NOTE — Telephone Encounter (Signed)
 Please arrange hospital follow up for patient in about 4 weeks with [facility] or [facility] .

## 2024-01-08 DIAGNOSIS — K828 Other specified diseases of gallbladder: Secondary | ICD-10-CM | POA: Diagnosis not present

## 2024-01-08 LAB — RENAL FUNCTION PANEL
Albumin: 2.7 g/dL — ABNORMAL LOW (ref 3.5–5.0)
Anion gap: 9 (ref 5–15)
BUN: 68 mg/dL — ABNORMAL HIGH (ref 6–20)
CO2: 20 mmol/L — ABNORMAL LOW (ref 22–32)
Calcium: 8.6 mg/dL — ABNORMAL LOW (ref 8.9–10.3)
Chloride: 108 mmol/L (ref 98–111)
Creatinine, Ser: 3.88 mg/dL — ABNORMAL HIGH (ref 0.44–1.00)
GFR, Estimated: 13 mL/min — ABNORMAL LOW (ref >60)
Glucose, Bld: 116 mg/dL — ABNORMAL HIGH (ref 70–99)
Phosphorus: 2.9 mg/dL (ref 2.5–4.6)
Potassium: 4.1 mmol/L (ref 3.5–5.1)
Sodium: 137 mmol/L (ref 135–145)

## 2024-01-08 NOTE — Progress Notes (Signed)
 PROGRESS NOTE   KINZLIE HARNEY  ZOX:[id number] DOB: 18-Aug-1966 DOA: 01/04/2024 PCP: Christene Lye, FNP   Chief Complaint  Patient presents with   Weakness   Level of care: Telemetry  Brief Admission History:  58 y.o. female with medical history significant of combined systolic and diastolic CHF with recovered EF, hypertension, A-fib, seizure, T2DM, hyperlipidemia who presents to the emergency department from [facility] assisted living facility via EMS due to 2-day onset of generalized weakness and minimal oral intake minimal oral intake.  She denies chest pain, shortness of breath, nausea, vomiting. She was noted to have AKI on CKD and acute hepatitis of undetermined cause with markedly elevated LFTs.    Assessment and Plan:  Acute transaminitis  - marked rise in LFTs now starting to trend down  - acute hepatitis labs negative - GI consulted, working up for AIH - outpatient follow up with [facility] GI for repeat labs, etc in 2 weeks  AKI on stage IV CKD  - has not yet started dialysis, cath placement to be arranged outpatient by Dr. Wolfgang Phoenix - nephrology consulted  - follow daily labs  - renal function slightly improved - IV fluid discontinued  Type 2 DM controlled - as evidenced by A1c of 6.1%  - diet controlled  PAF - resumed home apixaban, metoprolol  Epilepsy - resumed home keppra  Essential hypertension - resume home metoprolol - IV fluid discontinued   Hyperlipidemia  - temporarily holding home rosuvastatin given marked elevation in LFTs   DVT prophylaxis: apixaban Code Status: full  Family Communication: son updated telephone 3/13 Disposition: awaiting approval to return to ALF    Consultants:  GI Nephrology   Procedures:   Antimicrobials:    Subjective: No specific complaints, says she lives in an ALF and wishes to return there.    Objective: Vitals:   01/07/24 2014 01/08/24 0409 01/08/24 0500 01/08/24 1300  BP: (!) 146/96 125/79  (!)  154/105  Pulse: 71 68  77  Resp: 16 18    Temp: 98.4 F (36.9 C) 97.9 F (36.6 C)  98.4 F (36.9 C)  TempSrc: Oral   Oral  SpO2: 96% 100%  97%  Weight:   76.3 kg   Height:        Intake/Output Summary (Last 24 hours) at 01/08/2024 1525 Last data filed at 01/08/2024 1300 Gross per 24 hour  Intake 1544.18 ml  Output 950 ml  Net 594.18 ml   Filed Weights   01/05/24 1333 01/07/24 0500 01/08/24 0500  Weight: 76 kg 76.1 kg 76.3 kg   Examination:  General exam: Appears calm and comfortable  Respiratory system: Clear to auscultation. Respiratory effort normal. Cardiovascular system: normal S1 & S2 heard. No JVD, murmurs, rubs, gallops or clicks. No pedal edema. Gastrointestinal system: Abdomen is nondistended, soft and nontender. No organomegaly or masses felt. Normal bowel sounds heard. Central nervous system: Alert and oriented. No focal neurological deficits. Extremities: Symmetric 5 x 5 power. Skin: No rashes, lesions or ulcers. Psychiatry: Judgement and insight appear diminished. Mood & affect appropriate.   Data Reviewed: I have personally reviewed following labs and imaging studies  CBC: Recent Labs  Lab 01/04/24 1524 01/05/24 0309 01/06/24 0454 01/07/24 0444  WBC 7.4 6.2 4.4 5.3  NEUTROABS 5.2  --   --   --   HGB 9.7* 8.9* 9.3* 9.2*  HCT 30.5* 27.7* 29.7* 29.0*  MCV 93.0 93.6 93.4 93.5  PLT 184 171 167 190    Basic Metabolic Panel: Recent  Labs  Lab 01/04/24 1524 01/05/24 0309 01/06/24 0454 01/07/24 0444 01/08/24 0359  NA 138 139 141 140 137  K 3.8 3.5 3.6 3.7 4.1  CL 105 108 109 110 108  CO2 19* 21* 22 20* 20*  GLUCOSE 94 137* 100* 100* 116*  BUN 72* 74* 69* 65* 68*  CREATININE 4.91* 4.57* 3.85* 3.65* 3.88*  CALCIUM 9.0 8.7* 8.5* 8.5* 8.6*  MG  --  1.9  --  1.8  --   PHOS  --  4.6  --  3.2 2.9    CBG: No results for input(s): "GLUCAP" in the last 168 hours.  Recent Results (from the past 240 hours)  Resp panel by RT-PCR (RSV, Flu A&B, Covid)  Anterior Nasal Swab     Status: None   Collection Time: 01/04/24  4:13 PM   Specimen: Anterior Nasal Swab  Result Value Ref Range Status   SARS Coronavirus 2 by RT PCR NEGATIVE NEGATIVE Final    Comment: (NOTE) SARS-CoV-2 target nucleic acids are NOT DETECTED.  The SARS-CoV-2 RNA is generally detectable in upper respiratory specimens during the acute phase of infection. The lowest concentration of SARS-CoV-2 viral copies this assay can detect is 138 copies/mL. A negative result does not preclude SARS-Cov-2 infection and should not be used as the sole basis for treatment or other patient management decisions. A negative result may occur with  improper specimen collection/handling, submission of specimen other than nasopharyngeal swab, presence of viral mutation(s) within the areas targeted by this assay, and inadequate number of viral copies(<138 copies/mL). A negative result must be combined with clinical observations, patient history, and epidemiological information. The expected result is Negative.  Fact Sheet for Patients:  BloggerCourse.com  Fact Sheet for Healthcare Providers:  SeriousBroker.it  This test is no t yet approved or cleared by the [place] FDA and  has been authorized for detection and/or diagnosis of SARS-CoV-2 by FDA under an Emergency Use Authorization (EUA). This EUA will remain  in effect (meaning this test can be used) for the duration of the COVID-19 declaration under Section 564(b)(1) of the Act, 21 U.S.C.section 360bbb-3(b)(1), unless the authorization is terminated  or revoked sooner.       Influenza A by PCR NEGATIVE NEGATIVE Final   Influenza B by PCR NEGATIVE NEGATIVE Final    Comment: (NOTE) The Xpert Xpress SARS-CoV-2/FLU/RSV plus assay is intended as an aid in the diagnosis of influenza from Nasopharyngeal swab specimens and should not be used as a sole basis for treatment. Nasal washings  and aspirates are unacceptable for Xpert Xpress SARS-CoV-2/FLU/RSV testing.  Fact Sheet for Patients: BloggerCourse.com  Fact Sheet for Healthcare Providers: SeriousBroker.it  This test is not yet approved or cleared by the [place] FDA and has been authorized for detection and/or diagnosis of SARS-CoV-2 by FDA under an Emergency Use Authorization (EUA). This EUA will remain in effect (meaning this test can be used) for the duration of the COVID-19 declaration under Section 564(b)(1) of the Act, 21 U.S.C. section 360bbb-3(b)(1), unless the authorization is terminated or revoked.     Resp Syncytial Virus by PCR NEGATIVE NEGATIVE Final    Comment: (NOTE) Fact Sheet for Patients: BloggerCourse.com  Fact Sheet for Healthcare Providers: SeriousBroker.it  This test is not yet approved or cleared by the [place] FDA and has been authorized for detection and/or diagnosis of SARS-CoV-2 by FDA under an Emergency Use Authorization (EUA). This EUA will remain in effect (meaning this test can be used) for the duration  of the COVID-19 declaration under Section 564(b)(1) of the Act, 21 U.S.C. section 360bbb-3(b)(1), unless the authorization is terminated or revoked.  Performed at [facility], [address] [place], [place] [address]   MRSA Next Gen by PCR, Nasal     Status: None   Collection Time: 01/05/24 12:25 PM   Specimen: Nasal Mucosa; Nasal Swab  Result Value Ref Range Status   MRSA by PCR Next Gen NOT DETECTED NOT DETECTED Final    Comment: (NOTE) The GeneXpert MRSA Assay (FDA approved for NASAL specimens only), is one component of a comprehensive MRSA colonization surveillance program. It is not intended to diagnose MRSA infection nor to guide or monitor treatment for MRSA infections. Test performance is not FDA approved in patients less than 7 years old. Performed  at [facility], [address] [place], [place] [address]      Radiology Studies: US LIVER DOPPLER Result Date: 01/07/2024 CLINICAL DATA:  Elevated liver function tests. EXAM: DUPLEX ULTRASOUND OF LIVER TECHNIQUE: Color and duplex Doppler ultrasound was performed to evaluate the hepatic in-flow and out-flow vessels. COMPARISON:  Right upper quadrant abdominal ultrasound on 01/04/2024 FINDINGS: Liver: The liver demonstrates coarse echotexture and increased echogenicity, likely reflecting diffuse steatosis. No overt cirrhotic contour abnormalities or focal lesions are identified. There is no evidence of intrahepatic biliary ductal dilatation. Main Portal Vein size: 0.9 cm Portal Vein Velocities Main Prox:  54 cm/sec Main Mid: 55 cm/sec Main Dist:  32 cm/sec Right: 41 cm/sec Left: 30 cm/sec Hepatic Vein Velocities Right:  32 cm/sec Middle:  20 cm/sec Left:  40 cm/sec IVC: Present and patent with normal respiratory phasicity. Hepatic Artery Velocity:  61 cm/sec Splenic Vein Velocity:  36 cm/sec Spleen: 10 cm x 10 cm x 3.1 cm with a total volume of 163 cm^3 (411 cm^3 is upper limit normal) Portal Vein Occlusion/Thrombus: No Splenic Vein Occlusion/Thrombus: No Ascites: None Varices: None No evidence of portal vein thrombus. Direction of portal vein flow is towards the liver. Portal vein waveforms are normal. Hepatic veins are patent with no evidence to suggest hepatic veno-occlusive disease. Hepatic vein waveforms are normal. The splenic vein is patent. Hepatic artery demonstrates normal low resistance waveform. IMPRESSION: 1. Probable diffuse hepatic steatosis. No overt cirrhotic contour abnormalities or focal lesions. 2. Normal hepatic duplex ultrasound. No evidence of portal hypertension or hepatic veno-occlusive disease. Electronically Signed   By: Irish Lack M.D.   On: 01/07/2024 09:43   Scheduled Meds:  apixaban  5 mg Oral BID   Chlorhexidine Gluconate Cloth  6 each Topical Q0600   feeding  supplement  237 mL Oral BID BM   levETIRAcetam  500 mg Oral BID   metoprolol succinate  25 mg Oral Daily   polyethylene glycol  17 g Oral Daily   torsemide  20 mg Oral Daily   Continuous Infusions:   LOS: 4 days   Time spent: 47 mins  Yazlyn Wentzel Laural Benes, MD How to contact the [facility] Attending or Consulting provider 7A - 7P or covering provider during after hours 7P -7A, for this patient?  Check the care team in [facility] and look for a) attending/consulting TRH provider listed and b) the [facility] team listed Log into [email or web address] to find provider on call.  Locate the [facility] provider you are looking for under [facility] and page to a number that you can be directly reached. If you still have difficulty reaching the provider, please page the [facility] (Director on Call) for the Hospitalists listed on amion for  assistance.  01/08/2024, 3:25 PM

## 2024-01-09 DIAGNOSIS — K828 Other specified diseases of gallbladder: Secondary | ICD-10-CM | POA: Diagnosis not present

## 2024-01-09 LAB — RENAL FUNCTION PANEL
Albumin: 2.7 g/dL — ABNORMAL LOW (ref 3.5–5.0)
Anion gap: 9 (ref 5–15)
BUN: 64 mg/dL — ABNORMAL HIGH (ref 6–20)
CO2: 21 mmol/L — ABNORMAL LOW (ref 22–32)
Calcium: 8.6 mg/dL — ABNORMAL LOW (ref 8.9–10.3)
Chloride: 107 mmol/L (ref 98–111)
Creatinine, Ser: 3.48 mg/dL — ABNORMAL HIGH (ref 0.44–1.00)
GFR, Estimated: 15 mL/min — ABNORMAL LOW (ref >60)
Glucose, Bld: 150 mg/dL — ABNORMAL HIGH (ref 70–99)
Phosphorus: 2.7 mg/dL (ref 2.5–4.6)
Potassium: 4.2 mmol/L (ref 3.5–5.1)
Sodium: 137 mmol/L (ref 135–145)

## 2024-01-09 LAB — RAPID URINE DRUG SCREEN, HOSP PERFORMED
Amphetamines: NOT DETECTED
Barbiturates: NOT DETECTED
Benzodiazepines: NOT DETECTED
Cocaine: NOT DETECTED
Opiates: NOT DETECTED
Tetrahydrocannabinol: NOT DETECTED

## 2024-01-09 NOTE — Plan of Care (Signed)
   Problem: Activity: Goal: Risk for activity intolerance will decrease Outcome: Progressing   Problem: Coping: Goal: Level of anxiety will decrease Outcome: Progressing   Problem: Safety: Goal: Ability to remain free from injury will improve Outcome: Progressing

## 2024-01-09 NOTE — Progress Notes (Signed)
 PROGRESS NOTE   [name]  ZOX:[id number] DOB: [birth year] DOA: 01/04/2024 PCP: Christene Lye, FNP   Chief Complaint  Patient presents with   Weakness   Level of care: Telemetry  Brief Admission History:  58 y.o. female with medical history significant of combined systolic and diastolic CHF with recovered EF, hypertension, A-fib, seizure, T2DM, hyperlipidemia who presents to the emergency department from [facility] assisted living facility via EMS due to 2-day onset of generalized weakness and minimal oral intake minimal oral intake.  She denies chest pain, shortness of breath, nausea, vomiting. She was noted to have AKI on CKD and acute hepatitis of undetermined cause with markedly elevated LFTs.    Assessment and Plan:  Acute transaminitis  - marked rise in LFTs now starting to trend down  - acute hepatitis labs negative - GI consulted, working up for AIH - outpatient follow up with [facility] for repeat labs, etc in 2 weeks  AKI on stage IV CKD  - has not yet started dialysis, cath placement to be arranged outpatient by Dr. Wolfgang Phoenix - nephrology consulted  - follow daily labs  - renal function slightly improved - IV fluid discontinued  Type 2 DM controlled - as evidenced by A1c of 6.1%  - diet controlled  PAF - resumed home apixaban, metoprolol  Epilepsy - resumed home keppra  Essential hypertension - resume home metoprolol - IV fluid discontinued   Hyperlipidemia  - temporarily holding home rosuvastatin given marked elevation in LFTs  History of cocaine use - UDS negative this hospitalization  DVT prophylaxis: apixaban Code Status: full  Family Communication: son updated telephone 3/13 Disposition: return to [facility] tomorrow 01/10/24   Consultants:  GI Nephrology   Procedures:   Antimicrobials:    Subjective: Pt says she understands that she won't be able to return to [facility] until tomorrow.     Objective: Vitals:   01/08/24 1949  01/09/24 0604 01/09/24 0651 01/09/24 1401  BP: (!) 147/92  (!) 158/93 (!) 144/93  Pulse: 69  64 74  Resp: 18  18 16   Temp: 97.7 F (36.5 C)  98.3 F (36.8 C) 98.1 F (36.7 C)  TempSrc: Oral  Oral Oral  SpO2: 100%  97% 100%  Weight:  76.3 kg    Height:        Intake/Output Summary (Last 24 hours) at 01/09/2024 1443 Last data filed at 01/09/2024 0654 Gross per 24 hour  Intake 240 ml  Output 450 ml  Net -210 ml   Filed Weights   01/07/24 0500 01/08/24 0500 01/09/24 0604  Weight: 76.1 kg 76.3 kg 76.3 kg   Examination:  General exam: Appears calm and comfortable  Respiratory system: Clear to auscultation. Respiratory effort normal. Cardiovascular system: normal S1 & S2 heard. No JVD, murmurs, rubs, gallops or clicks. No pedal edema. Gastrointestinal system: Abdomen is nondistended, soft and nontender. No organomegaly or masses felt. Normal bowel sounds heard. Central nervous system: Alert and oriented. No focal neurological deficits. Extremities: Symmetric 5 x 5 power. Skin: No rashes, lesions or ulcers. Psychiatry: Judgement and insight appear diminished. Mood & affect appropriate.   Data Reviewed: I have personally reviewed following labs and imaging studies  CBC: Recent Labs  Lab 01/04/24 1524 01/05/24 0309 01/06/24 0454 01/07/24 0444  WBC 7.4 6.2 4.4 5.3  NEUTROABS 5.2  --   --   --   HGB 9.7* 8.9* 9.3* 9.2*  HCT 30.5* 27.7* 29.7* 29.0*  MCV 93.0 93.6 93.4 93.5  PLT 184 171 167 190    Basic Metabolic Panel: Recent Labs  Lab 01/05/24 0309 01/06/24 0454 01/07/24 0444 01/08/24 0359 01/09/24 0410  NA 139 141 140 137 137  K 3.5 3.6 3.7 4.1 4.2  CL 108 109 110 108 107  CO2 21* 22 20* 20* 21*  GLUCOSE 137* 100* 100* 116* 150*  BUN 74* 69* 65* 68* 64*  CREATININE 4.57* 3.85* 3.65* 3.88* 3.48*  CALCIUM 8.7* 8.5* 8.5* 8.6* 8.6*  MG 1.9  --  1.8  --   --   PHOS 4.6  --  3.2 2.9 2.7    CBG: No results for input(s): "GLUCAP" in the last 168 hours.  Recent  Results (from the past 240 hours)  Resp panel by RT-PCR (RSV, Flu A&B, Covid) Anterior Nasal Swab     Status: None   Collection Time: 01/04/24  4:13 PM   Specimen: Anterior Nasal Swab  Result Value Ref Range Status   SARS Coronavirus 2 by RT PCR NEGATIVE NEGATIVE Final    Comment: (NOTE) SARS-CoV-2 target nucleic acids are NOT DETECTED.  The SARS-CoV-2 RNA is generally detectable in upper respiratory specimens during the acute phase of infection. The lowest concentration of SARS-CoV-2 viral copies this assay can detect is 138 copies/mL. A negative result does not preclude SARS-Cov-2 infection and should not be used as the sole basis for treatment or other patient management decisions. A negative result may occur with  improper specimen collection/handling, submission of specimen other than nasopharyngeal swab, presence of viral mutation(s) within the areas targeted by this assay, and inadequate number of viral copies(<138 copies/mL). A negative result must be combined with clinical observations, patient history, and epidemiological information. The expected result is Negative.  Fact Sheet for Patients:  BloggerCourse.com  Fact Sheet for Healthcare Providers:  SeriousBroker.it  This test is no t yet approved or cleared by the [place] FDA and  has been authorized for detection and/or diagnosis of SARS-CoV-2 by FDA under an Emergency Use Authorization (EUA). This EUA will remain  in effect (meaning this test can be used) for the duration of the COVID-19 declaration under Section 564(b)(1) of the Act, 21 U.S.C.section 360bbb-3(b)(1), unless the authorization is terminated  or revoked sooner.       Influenza A by PCR NEGATIVE NEGATIVE Final   Influenza B by PCR NEGATIVE NEGATIVE Final    Comment: (NOTE) The Xpert Xpress SARS-CoV-2/FLU/RSV plus assay is intended as an aid in the diagnosis of influenza from Nasopharyngeal swab  specimens and should not be used as a sole basis for treatment. Nasal washings and aspirates are unacceptable for Xpert Xpress SARS-CoV-2/FLU/RSV testing.  Fact Sheet for Patients: BloggerCourse.com  Fact Sheet for Healthcare Providers: SeriousBroker.it  This test is not yet approved or cleared by the [place] FDA and has been authorized for detection and/or diagnosis of SARS-CoV-2 by FDA under an Emergency Use Authorization (EUA). This EUA will remain in effect (meaning this test can be used) for the duration of the COVID-19 declaration under Section 564(b)(1) of the Act, 21 U.S.C. section 360bbb-3(b)(1), unless the authorization is terminated or revoked.     Resp Syncytial Virus by PCR NEGATIVE NEGATIVE Final    Comment: (NOTE) Fact Sheet for Patients: BloggerCourse.com  Fact Sheet for Healthcare Providers: SeriousBroker.it  This test is not yet approved or cleared by the [place] FDA and has been authorized for detection and/or diagnosis of SARS-CoV-2 by FDA under an Emergency Use Authorization (EUA). This EUA will remain in  effect (meaning this test can be used) for the duration of the COVID-19 declaration under Section 564(b)(1) of the Act, 21 U.S.C. section 360bbb-3(b)(1), unless the authorization is terminated or revoked.  Performed at [facility], [address], [place], [place] [address]   MRSA Next Gen by PCR, Nasal     Status: None   Collection Time: 01/05/24 12:25 PM   Specimen: Nasal Mucosa; Nasal Swab  Result Value Ref Range Status   MRSA by PCR Next Gen NOT DETECTED NOT DETECTED Final    Comment: (NOTE) The GeneXpert MRSA Assay (FDA approved for NASAL specimens only), is one component of a comprehensive MRSA colonization surveillance program. It is not intended to diagnose MRSA infection nor to guide or monitor treatment for MRSA infections. Test  performance is not FDA approved in patients less than 14 years old. Performed at [facility], [address] [place], [place] [address]      Radiology Studies: No results found.  Scheduled Meds:  apixaban  5 mg Oral BID   Chlorhexidine Gluconate Cloth  6 each Topical Q0600   feeding supplement  237 mL Oral BID BM   levETIRAcetam  500 mg Oral BID   metoprolol succinate  25 mg Oral Daily   polyethylene glycol  17 g Oral Daily   torsemide  20 mg Oral Daily   Continuous Infusions:   LOS: 5 days   Time spent: 44 mins  Cesar Rogerson Laural Benes, MD How to contact the [facility] Attending or Consulting provider 7A - 7P or covering provider during after hours 7P -7A, for this patient?  Check the care team in [facility] and look for a) attending/consulting TRH provider listed and b) the [facility] team listed Log into [email or web address] to find provider on call.  Locate the [facility] provider you are looking for under [facility] and page to a number that you can be directly reached. If you still have difficulty reaching the provider, please page the [facility] (Director on Call) for the Hospitalists listed on amion for assistance.  01/09/2024, 2:43 PM

## 2024-01-09 NOTE — Plan of Care (Signed)

## 2024-01-10 DIAGNOSIS — I480 Paroxysmal atrial fibrillation: Secondary | ICD-10-CM | POA: Diagnosis not present

## 2024-01-10 DIAGNOSIS — K828 Other specified diseases of gallbladder: Secondary | ICD-10-CM | POA: Diagnosis not present

## 2024-01-10 DIAGNOSIS — I10 Essential (primary) hypertension: Secondary | ICD-10-CM | POA: Diagnosis not present

## 2024-01-10 DIAGNOSIS — N185 Chronic kidney disease, stage 5: Secondary | ICD-10-CM | POA: Diagnosis not present

## 2024-01-10 LAB — RENAL FUNCTION PANEL
Albumin: 2.7 g/dL — ABNORMAL LOW (ref 3.5–5.0)
Anion gap: 8 (ref 5–15)
BUN: 65 mg/dL — ABNORMAL HIGH (ref 6–20)
CO2: 20 mmol/L — ABNORMAL LOW (ref 22–32)
Calcium: 8.8 mg/dL — ABNORMAL LOW (ref 8.9–10.3)
Chloride: 111 mmol/L (ref 98–111)
Creatinine, Ser: 3.34 mg/dL — ABNORMAL HIGH (ref 0.44–1.00)
GFR, Estimated: 15 mL/min — ABNORMAL LOW (ref >60)
Glucose, Bld: 136 mg/dL — ABNORMAL HIGH (ref 70–99)
Phosphorus: 3 mg/dL (ref 2.5–4.6)
Potassium: 4.4 mmol/L (ref 3.5–5.1)
Sodium: 139 mmol/L (ref 135–145)

## 2024-01-10 MED ORDER — OXYBUTYNIN CHLORIDE ER 5 MG PO TB24: 5.0000 mg | ORAL_TABLET | Freq: Every day | ORAL | Status: AC

## 2024-01-10 MED ORDER — POLYETHYLENE GLYCOL 3350 17 G PO PACK: 17.0000 g | PACK | Freq: Every day | ORAL | 0 refills | Status: DC

## 2024-01-10 MED ORDER — TORSEMIDE 20 MG PO TABS: 20.0000 mg | ORAL_TABLET | Freq: Every day | ORAL | 0 refills | Status: DC

## 2024-01-10 MED ORDER — AMLODIPINE BESYLATE 5 MG PO TABS: 5.0000 mg | ORAL_TABLET | Freq: Every day | ORAL | 1 refills | Status: DC

## 2024-01-10 NOTE — TOC Transition Note (Signed)
 Transition of Care [facility]) - Discharge Note   Patient Details  Name: [name] MRN: [id number] Date of Birth: 1965-10-28  Transition of Care [facility]) CM/SW Contact:  Karn Cassis, LCSW Phone Number: 01/10/2024, 2:02 PM   Clinical Narrative:  Pt d/c today. Pt, pt's son, and facility aware and agreeable. D/C summary and FL2 sent to ALF. Clydie Braun with [facility] notified of d/c. HHPT order is in. Facility to pick up pt this afternoon.       Final next level of care: Assisted Living Barriers to Discharge: Barriers Resolved   Patient Goals and CMS Choice Patient states their goals for this hospitalization and ongoing recovery are:: to be determined          Discharge Placement                Patient to be transferred to facility by: facility Zenaida Niece Name of family member notified: son Patient and family notified of of transfer: 01/10/24  Discharge Plan and Services Additional resources added to the After Visit Summary for   In-house Referral: Clinical Social Work                        HH Arranged: PT HH Agency: [facility] Date [facility] Agency Contacted: 01/06/24 Time HH Agency Contacted: [phone] Representative spoke with at [facility] Agency: Clydie Braun  Social Drivers of Health (SDOH) Interventions SDOH Screenings   Food Insecurity: No Food Insecurity (01/04/2024)  Housing: Low Risk  (01/04/2024)  Transportation Needs: No Transportation Needs (01/04/2024)  Utilities: Not At Risk (01/04/2024)  Alcohol Screen: Low Risk  (10/15/2020)  Depression (PHQ2-9): Low Risk  (07/26/2023)  Financial Resource Strain: Medium Risk (10/15/2020)  Physical Activity: Insufficiently Active (10/15/2020)  Social Connections: Moderately Isolated (10/15/2020)  Stress: Stress Concern Present (10/15/2020)  Tobacco Use: Medium Risk (01/06/2024)     Readmission Risk Interventions    01/05/2024    1:38 PM 07/25/2021    9:44 AM  Readmission Risk Prevention Plan  Transportation  Screening Complete Complete  Medication Review (RN Care Manager) Complete Complete  PCP or Specialist appointment within 3-5 days of discharge  Complete  HRI or Home Care Consult Complete Complete  SW Recovery Care/Counseling Consult Complete Complete  Palliative Care Screening Not Applicable Not Applicable  Skilled Nursing Facility -- Complete
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
## 2024-01-10 NOTE — Progress Notes (Signed)
 [facility] Progress Note    Assessment/ Plan:   CKD 5 -followed by Dr. Wolfgang Phoenix as an outpatient. Being set up for dialysis as an outpatient. Cr down to 3.3 today, nonoliguric -at this junction, can follow up with her outpatient nephrologist to continue her management/dialysis initiation. No indications to start renal replacement therapy currently -Avoid nephrotoxic medications including NSAIDs and iodinated intravenous contrast exposure unless the latter is absolutely indicated.  Preferred narcotic agents for pain control are hydromorphone, fentanyl, and methadone. Morphine should not be used. Avoid Baclofen and avoid oral sodium phosphate and magnesium citrate based laxatives / bowel preps. Continue strict Input and Output monitoring. Will monitor the patient closely with you and intervene or adjust therapy as indicated by changes in clinical status/labs   Acute transaminitis -GI following -LFTs improved as of 3/14  Anemia -transfuse PRN for hgb <7. Has adequate iron stores w/ elevated ferritin. Recommend repeating CBC if she is to be here longer than anticipated to determine if she will need ESA therapy. Last hgb stable 9.2 on 3/14  HTN -BP stable, consider starting amlodipine if needed  Dispo: pending transfer back to [facility]  Nothing else to add from an inpatient nephrology perspective, will sign off. Please call with any questions/concerns.  Subjective:   Patient seen and examined bedside. UOP charted ~1.1L No acute events, no complaints today. Denies any chest pain, SOB, decreased urinary frequency, loss of appetite, dysgeusia, N/V.   Objective:   BP (!) 150/81 (BP Location: Right Arm)   Pulse 87   Temp 98.4 F (36.9 C) (Oral)   Resp 18   Ht 5\' 1"  (1.549 m)   Wt 78.9 kg   LMP 05/19/2016   SpO2 100%   BMI 32.87 kg/m   Intake/Output Summary (Last 24 hours) at 01/10/2024 0755 Last data filed at 01/10/2024 0542 Gross per 24 hour  Intake 480 ml   Output 1100 ml  Net -620 ml   Weight change: 2.6 kg  Physical Exam: Gen: NAD, laying flat CVS: RRR Resp: normal wob, unlabored, speaking in full sentences Abd: soft Ext: no edema Neuro: awake, alert  Imaging: No results found.  Labs: BMET Recent Labs  Lab 01/04/24 1524 01/05/24 0309 01/06/24 0454 01/07/24 0444 01/08/24 0359 01/09/24 0410 01/10/24 0353  NA 138 139 141 140 137 137 139  K 3.8 3.5 3.6 3.7 4.1 4.2 4.4  CL 105 108 109 110 108 107 111  CO2 19* 21* 22 20* 20* 21* 20*  GLUCOSE 94 137* 100* 100* 116* 150* 136*  BUN 72* 74* 69* 65* 68* 64* 65*  CREATININE 4.91* 4.57* 3.85* 3.65* 3.88* 3.48* 3.34*  CALCIUM 9.0 8.7* 8.5* 8.5* 8.6* 8.6* 8.8*  PHOS  --  4.6  --  3.2 2.9 2.7 3.0   CBC Recent Labs  Lab 01/04/24 1524 01/05/24 0309 01/06/24 0454 01/07/24 0444  WBC 7.4 6.2 4.4 5.3  NEUTROABS 5.2  --   --   --   HGB 9.7* 8.9* 9.3* 9.2*  HCT 30.5* 27.7* 29.7* 29.0*  MCV 93.0 93.6 93.4 93.5  PLT 184 171 167 190    Medications:     apixaban  5 mg Oral BID   Chlorhexidine Gluconate Cloth  6 each Topical Q0600   feeding supplement  237 mL Oral BID BM   levETIRAcetam  500 mg Oral BID   metoprolol succinate  25 mg Oral Daily   polyethylene glycol  17 g Oral Daily   torsemide  20 mg Oral  Daily      Anthony Sar, MD [facility] 01/10/2024, 7:55 AM

## 2024-01-10 NOTE — Telephone Encounter (Signed)
 Tried to call facility several times. The phone just rings. Will try back later.

## 2024-01-10 NOTE — Discharge Summary (Signed)
 Physician Discharge Summary  AADHIRA HEFFERNAN LKG:[id number] DOB: 26-Jan-1966 DOA: 01/04/2024  PCP: Christene Lye, FNP  Admit date: 01/04/2024 Discharge date: 01/10/2024  Admitted From:  HOME  Disposition: HOME   Recommendations for Outpatient Follow-up:  Follow up with PCP in 1 weeks Please obtain BMP/CBC in one week  Discharge Condition: STABLE   CODE STATUS: FULL DIET: renal/carb modified    Brief Hospitalization Summary: Please see all hospital notes, images, labs for full details of the hospitalization. Admission provider HPI: 58 y.o. 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  She denies chest pain, shortness of breath, nausea, vomiting. She was noted to have AKI on CKD and acute hepatitis of undetermined cause with markedly elevated LFTs.   HOSPITAL COURSE BY PROBLEM LIST  Acute transaminitis  - marked rise in LFTs now starting to trend down  - acute hepatitis labs negative - GI consulted, recommending outpatient follow up  - outpatient follow up with [facility] for repeat labs, etc in 2 weeks   AKI on stage IV CKD  - has not yet started dialysis, cath placement to be arranged outpatient by Dr. Wolfgang Phoenix - nephrology consulted  - follow daily labs  - renal function slightly improved - IV fluid discontinued - outpatient follow up with nephrologist Dr. Wolfgang Phoenix    Type 2 DM controlled - as evidenced by A1c of 6.1%  - diet controlled   PAF - resumed home apixaban, metoprolol   Epilepsy - resumed home keppra   Essential hypertension - resume home metoprolol - IV fluid discontinued  - added amlodipine 5 mg daily    Hyperlipidemia  - temporarily holding home rosuvastatin given marked  elevation in LFTs - would not resume until ok with GI    History of cocaine use - UDS negative this hospitalization   Discharge Diagnoses:  Principal Problem:   Gallbladder sludge Active Problems:   Essential hypertension   Mixed hyperlipidemia   Elevated troponin   PAF (paroxysmal atrial fibrillation) (HCC)   Chronic kidney disease, stage V (HCC)   Hypoalbuminemia due to protein-calorie malnutrition (HCC)   Obesity, Class I, BMI 30-34.9   History of seizure   Transaminitis   Discharge Instructions:  Allergies as of 01/10/2024       Reactions   Sodium Hypochlorite Anaphylaxis   (Bleach wipes) "I am not able to breathe"        Medication List     STOP taking these medications    furosemide 40 MG tablet Commonly known as: LASIX   HumaLOG KwikPen 100 UNIT/ML KwikPen Generic drug: insulin lispro   LORazepam 0.5 MG tablet Commonly known as: ATIVAN   NIFEdipine 60 MG 24 hr tablet Commonly known as: PROCARDIA XL/NIFEDICAL XL   rosuvastatin 10 MG tablet Commonly known as: CRESTOR   sodium bicarbonate 650 MG tablet   traMADol 50 MG tablet Commonly known as: ULTRAM       TAKE these medications    acetaminophen 325 MG tablet Commonly known as: TYLENOL Take 2 tablets (650 mg total) by mouth every 6 (six) hours as needed for headache, fever or mild pain.   amLODipine 5 MG tablet Commonly known as: NORVASC Take 1 tablet (5 mg total) by mouth daily.   ammonium lactate 12 % lotion Commonly  known as: AmLactin Apply 1 Application topically as needed for dry skin.   apixaban 5 MG Tabs tablet Commonly known as: Eliquis Take 1 tablet (5 mg total) by mouth 2 (two) times daily.   ARIPiprazole 5 MG tablet Commonly known as: Abilify Take 1 tablet (5 mg total) by mouth daily.   azelastine 0.05 % ophthalmic solution Commonly known as: OPTIVAR Place 1 drop into both eyes 2 (two) times daily.   calcitRIOL 0.25 MCG capsule Commonly known as: Rocaltrol Take 1  capsule (0.25 mcg total) by mouth daily.   FLUoxetine 40 MG capsule Commonly known as: PROzac Take 2 pills once a day   fluticasone 50 MCG/ACT nasal spray Commonly known as: FLONASE Place 2 sprays into both nostrils daily.   levETIRAcetam 500 MG tablet Commonly known as: Keppra Take 1 tablet (500 mg total) by mouth 2 (two) times daily.   loperamide 2 MG capsule Commonly known as: IMODIUM Take 4 mg by mouth every 4 (four) hours as needed for diarrhea or loose stools.   metoprolol succinate 25 MG 24 hr tablet Commonly known as: TOPROL-XL TAKE (1) TABLET BY MOUTH IN THE MORNING.   ondansetron 4 MG disintegrating tablet Commonly known as: ZOFRAN-ODT Take 4 mg by mouth every 8 (eight) hours as needed for nausea or vomiting.   oxybutynin 5 MG 24 hr tablet Commonly known as: DITROPAN-XL Take 1 tablet (5 mg total) by mouth at bedtime. What changed: See the new instructions.   Pataday 0.1 % ophthalmic solution Generic drug: olopatadine Place 1 drop into both eyes 2 (two) times daily.   polyethylene glycol 17 g packet Commonly known as: MIRALAX / GLYCOLAX Take 17 g by mouth daily.   thiamine 100 MG tablet Commonly known as: VITAMIN B1 TAKE (1) TABLET BY MOUTH ONCE DAILY.   torsemide 20 MG tablet Commonly known as: DEMADEX Take 1 tablet (20 mg total) by mouth daily.        Follow-up Information     [facility], [facility] Follow up.   Contact information: [address] [place] [address] [phone]         Randa Lynn, MD. Schedule an appointment as soon as possible for a visit in 2 week(s).   Specialty: Nephrology Why: Hospital Follow Up Contact information: [address] [place] [address] [phone]         Christene Lye, FNP. Schedule an appointment as soon as possible for a visit in 1 week(s).   Specialty: Family Medicine Why: Hospital Follow Up Contact information: [address] [place]  [address] [phone]         [facility] Schedule an appointment as soon as possible for a visit in 2 week(s).   Specialty: Gastroenterology Why: Hospital Follow Up Contact information: [address] [place] [address] [phone]               Allergies  Allergen Reactions   Sodium Hypochlorite Anaphylaxis    (Bleach wipes) "I am not able to breathe"   Allergies as of 01/10/2024       Reactions   Sodium Hypochlorite Anaphylaxis   (Bleach wipes) "I am not able to breathe"        Medication List     STOP taking these medications    furosemide 40 MG tablet Commonly known as: LASIX   HumaLOG KwikPen 100 UNIT/ML KwikPen Generic drug: insulin lispro   LORazepam 0.5 MG tablet Commonly known  as: ATIVAN   NIFEdipine 60 MG 24 hr tablet Commonly known as: PROCARDIA XL/NIFEDICAL XL   rosuvastatin 10 MG tablet Commonly known as: CRESTOR   sodium bicarbonate 650 MG tablet   traMADol 50 MG tablet Commonly known as: ULTRAM       TAKE these medications    acetaminophen 325 MG tablet Commonly known as: TYLENOL Take 2 tablets (650 mg total) by mouth every 6 (six) hours as needed for headache, fever or mild pain.   amLODipine 5 MG tablet Commonly known as: NORVASC Take 1 tablet (5 mg total) by mouth daily.   ammonium lactate 12 % lotion Commonly known as: AmLactin Apply 1 Application topically as needed for dry skin.   apixaban 5 MG Tabs tablet Commonly known as: Eliquis Take 1 tablet (5 mg total) by mouth 2 (two) times daily.   ARIPiprazole 5 MG tablet Commonly known as: Abilify Take 1 tablet (5 mg total) by mouth daily.   azelastine 0.05 % ophthalmic solution Commonly known as: OPTIVAR Place 1 drop into both eyes 2 (two) times daily.   calcitRIOL 0.25 MCG capsule Commonly known as: Rocaltrol Take 1 capsule (0.25 mcg total) by mouth daily.   FLUoxetine 40 MG capsule Commonly  known as: PROzac Take 2 pills once a day   fluticasone 50 MCG/ACT nasal spray Commonly known as: FLONASE Place 2 sprays into both nostrils daily.   levETIRAcetam 500 MG tablet Commonly known as: Keppra Take 1 tablet (500 mg total) by mouth 2 (two) times daily.   loperamide 2 MG capsule Commonly known as: IMODIUM Take 4 mg by mouth every 4 (four) hours as needed for diarrhea or loose stools.   metoprolol succinate 25 MG 24 hr tablet Commonly known as: TOPROL-XL TAKE (1) TABLET BY MOUTH IN THE MORNING.   ondansetron 4 MG disintegrating tablet Commonly known as: ZOFRAN-ODT Take 4 mg by mouth every 8 (eight) hours as needed for nausea or vomiting.   oxybutynin 5 MG 24 hr tablet Commonly known as: DITROPAN-XL Take 1 tablet (5 mg total) by mouth at bedtime. What changed: See the new instructions.   Pataday 0.1 % ophthalmic solution Generic drug: olopatadine Place 1 drop into both eyes 2 (two) times daily.   polyethylene glycol 17 g packet Commonly known as: MIRALAX / GLYCOLAX Take 17 g by mouth daily.   thiamine 100 MG tablet Commonly known as: VITAMIN B1 TAKE (1) TABLET BY MOUTH ONCE DAILY.   torsemide 20 MG tablet Commonly known as: DEMADEX Take 1 tablet (20 mg total) by mouth daily.        Procedures/Studies: US LIVER DOPPLER Result Date: 01/07/2024 CLINICAL DATA:  Elevated liver function tests. EXAM: DUPLEX ULTRASOUND OF LIVER TECHNIQUE: Color and duplex Doppler ultrasound was performed to evaluate the hepatic in-flow and out-flow vessels. COMPARISON:  Right upper quadrant abdominal ultrasound on 01/04/2024 FINDINGS: Liver: The liver demonstrates coarse echotexture and increased echogenicity, likely reflecting diffuse steatosis. No overt cirrhotic contour abnormalities or focal lesions are identified. There is no evidence of intrahepatic biliary ductal dilatation. Main Portal Vein size: 0.9 cm Portal Vein Velocities Main Prox:  54 cm/sec Main Mid: 55 cm/sec Main Dist:   32 cm/sec Right: 41 cm/sec Left: 30 cm/sec Hepatic Vein Velocities Right:  32 cm/sec Middle:  20 cm/sec Left:  40 cm/sec IVC: Present and patent with normal respiratory phasicity. Hepatic Artery Velocity:  61 cm/sec Splenic Vein Velocity:  36 cm/sec Spleen: 10 cm x 10 cm x 3.1 cm with a total volume  of 163 cm^3 (411 cm^3 is upper limit normal) Portal Vein Occlusion/Thrombus: No Splenic Vein Occlusion/Thrombus: No Ascites: None Varices: None No evidence of portal vein thrombus. Direction of portal vein flow is towards the liver. Portal vein waveforms are normal. Hepatic veins are patent with no evidence to suggest hepatic veno-occlusive disease. Hepatic vein waveforms are normal. The splenic vein is patent. Hepatic artery demonstrates normal low resistance waveform. IMPRESSION: 1. Probable diffuse hepatic steatosis. No overt cirrhotic contour abnormalities or focal lesions. 2. Normal hepatic duplex ultrasound. No evidence of portal hypertension or hepatic veno-occlusive disease. Electronically Signed   By: Irish Lack M.D.   On: 01/07/2024 09:43   US RENAL Result Date: 01/05/2024 CLINICAL DATA:  Acute kidney injury. EXAM: RENAL / URINARY TRACT ULTRASOUND COMPLETE COMPARISON:  Renal ultrasound from 06/14/2023. FINDINGS: Right Kidney: Renal measurements: 5.1 x 5.1 x 9.2 cm. = volume: 125.1 mL. There is diffuse increased cortical echogenicity. There is mild fullness in the renal collecting system without frank hydronephrosis. No contour deforming mass or stone large enough to cause acoustic shadowing. Left Kidney: Renal measurements: 4.9 x 5.0 x 8.7 cm. = volume: 112.1 mL. There is diffuse increased cortical echogenicity. No hydronephrosis, contour deforming mass or stone large enough to cause acoustic shadowing. Bladder: Appears normal for degree of bladder distention. Other: None. IMPRESSION: *Bilateral kidneys exhibit increased cortical echogenicity, nonspecific but commonly seen with medical renal disease.  *Mild fullness in the right renal collecting system without frank hydronephrosis. No left hydronephrosis. Electronically Signed   By: Jules Schick M.D.   On: 01/05/2024 10:33   US Abdomen Limited RUQ (LIVER/GB) Result Date: 01/04/2024 CLINICAL DATA:  Nausea and abdominal pain. EXAM: ULTRASOUND ABDOMEN LIMITED RIGHT UPPER QUADRANT COMPARISON:  None Available. FINDINGS: Gallbladder: There is nonshadowing echogenic content within the gallbladder which may represent tumefactive sludge. No shadowing stone. There is no gallbladder wall thickening or pericholecystic fluid. Negative sonographic Murphy's sign. Common bile duct: Diameter: 14 mm Liver: There is diffuse increased liver echogenicity most commonly seen in the setting of fatty infiltration. Superimposed inflammation or fibrosis is not excluded. Clinical correlation is recommended. Portal vein is patent on color Doppler imaging with normal direction of blood flow towards the liver. Other: Increased right renal echogenicity. IMPRESSION: 1. Probable tumefactive sludge in the gallbladder. No shadowing stone or sonographic evidence of acute cholecystitis. 2. Fatty liver. 3. Echogenic right kidney suggestive of medical renal disease. Clinical correlation is recommended. Electronically Signed   By: Elgie Collard M.D.   On: 01/04/2024 18:56   DG Chest Port 1 View Result Date: 01/04/2024 CLINICAL DATA:  Weakness. EXAM: PORTABLE CHEST 1 VIEW COMPARISON:  Chest radiograph dated 07/20/2021. FINDINGS: Shallow inspiration. No focal consolidation, pleural effusion, or pneumothorax. Mild cardiomegaly. No acute osseous pathology. IMPRESSION: 1. No active disease. 2. Mild cardiomegaly. Electronically Signed   By: Elgie Collard M.D.   On: 01/04/2024 18:16     Subjective: Pt reports that she feels well, agreeable to returning to [facility]. No specific complaints.   Discharge Exam: Vitals:   01/09/24 2040 01/10/24 0411  BP: (!) 136/94 (!) 150/81  Pulse: 98 87   Resp: 18 18  Temp: 98.5 F (36.9 C) 98.4 F (36.9 C)  SpO2: 99% 100%   Vitals:   01/09/24 0651 01/09/24 1401 01/09/24 2040 01/10/24 0411  BP: (!) 158/93 (!) 144/93 (!) 136/94 (!) 150/81  Pulse: 64 74 98 87  Resp: 18 16 18 18   Temp: 98.3 F (36.8 C) 98.1 F (36.7 C) 98.5  F (36.9 C) 98.4 F (36.9 C)  TempSrc: Oral Oral Oral Oral  SpO2: 97% 100% 99% 100%  Weight:    78.9 kg  Height:       General: Pt is alert, awake, not in acute distress Cardiovascular: normal S1/S2 +, no rubs, no gallops Respiratory: CTA bilaterally, no wheezing, no rhonchi Abdominal: Soft, NT, ND, bowel sounds + Extremities: trace pretibial edema, no cyanosis   The results of significant diagnostics from this hospitalization (including imaging, microbiology, ancillary and laboratory) are listed below for reference.     Microbiology: Recent Results (from the past 240 hours)  Resp panel by RT-PCR (RSV, Flu A&B, Covid) Anterior Nasal Swab     Status: None   Collection Time: 01/04/24  4:13 PM   Specimen: Anterior Nasal Swab  Result Value Ref Range Status   SARS Coronavirus 2 by RT PCR NEGATIVE NEGATIVE Final    Comment: (NOTE) SARS-CoV-2 target nucleic acids are NOT DETECTED.  The SARS-CoV-2 RNA is generally detectable in upper respiratory specimens during the acute phase of infection. The lowest concentration of SARS-CoV-2 viral copies this assay can detect is 138 copies/mL. A negative result does not preclude SARS-Cov-2 infection and should not be used as the sole basis for treatment or other patient management decisions. A negative result may occur with  improper specimen collection/handling, submission of specimen other than nasopharyngeal swab, presence of viral mutation(s) within the areas targeted by this assay, and inadequate number of viral copies(<138 copies/mL). A negative result must be combined with clinical observations, patient history, and epidemiological information. The expected  result is Negative.  Fact Sheet for Patients:  BloggerCourse.com  Fact Sheet for Healthcare Providers:  SeriousBroker.it  This test is no t yet approved or cleared by the [place] FDA and  has been authorized for detection and/or diagnosis of SARS-CoV-2 by FDA under an Emergency Use Authorization (EUA). This EUA will remain  in effect (meaning this test can be used) for the duration of the COVID-19 declaration under Section 564(b)(1) of the Act, 21 U.S.C.section 360bbb-3(b)(1), unless the authorization is terminated  or revoked sooner.       Influenza A by PCR NEGATIVE NEGATIVE Final   Influenza B by PCR NEGATIVE NEGATIVE Final    Comment: (NOTE) The Xpert Xpress SARS-CoV-2/FLU/RSV plus assay is intended as an aid in the diagnosis of influenza from Nasopharyngeal swab specimens and should not be used as a sole basis for treatment. Nasal washings and aspirates are unacceptable for Xpert Xpress SARS-CoV-2/FLU/RSV testing.  Fact Sheet for Patients: BloggerCourse.com  Fact Sheet for Healthcare Providers: SeriousBroker.it  This test is not yet approved or cleared by the [place] FDA and has been authorized for detection and/or diagnosis of SARS-CoV-2 by FDA under an Emergency Use Authorization (EUA). This EUA will remain in effect (meaning this test can be used) for the duration of the COVID-19 declaration under Section 564(b)(1) of the Act, 21 U.S.C. section 360bbb-3(b)(1), unless the authorization is terminated or revoked.     Resp Syncytial Virus by PCR NEGATIVE NEGATIVE Final    Comment: (NOTE) Fact Sheet for Patients: BloggerCourse.com  Fact Sheet for Healthcare Providers: SeriousBroker.it  This test is not yet approved or cleared by the [place] FDA and has been authorized for detection and/or diagnosis of  SARS-CoV-2 by FDA under an Emergency Use Authorization (EUA). This EUA will remain in effect (meaning this test can be used) for the duration of the COVID-19 declaration under Section 564(b)(1) of the Act, 21  U.S.C. section 360bbb-3(b)(1), unless the authorization is terminated or revoked.  Performed at [facility], [address], [place], [place] [address]   MRSA Next Gen by PCR, Nasal     Status: None   Collection Time: 01/05/24 12:25 PM   Specimen: Nasal Mucosa; Nasal Swab  Result Value Ref Range Status   MRSA by PCR Next Gen NOT DETECTED NOT DETECTED Final    Comment: (NOTE) The GeneXpert MRSA Assay (FDA approved for NASAL specimens only), is one component of a comprehensive MRSA colonization surveillance program. It is not intended to diagnose MRSA infection nor to guide or monitor treatment for MRSA infections. Test performance is not FDA approved in patients less than 32 years old. Performed at [facility], [address], [place], [place] [address]      Labs: BNP (last 3 results) No results for input(s): "BNP" in the last 8760 hours. Basic Metabolic Panel: Recent Labs  Lab 01/05/24 0309 01/06/24 0454 01/07/24 0444 01/08/24 0359 01/09/24 0410 01/10/24 0353  NA 139 141 140 137 137 139  K 3.5 3.6 3.7 4.1 4.2 4.4  CL 108 109 110 108 107 111  CO2 21* 22 20* 20* 21* 20*  GLUCOSE 137* 100* 100* 116* 150* 136*  BUN 74* 69* 65* 68* 64* 65*  CREATININE 4.57* 3.85* 3.65* 3.88* 3.48* 3.34*  CALCIUM 8.7* 8.5* 8.5* 8.6* 8.6* 8.8*  MG 1.9  --  1.8  --   --   --   PHOS 4.6  --  3.2 2.9 2.7 3.0   Liver Function Tests: Recent Labs  Lab 01/04/24 1524 01/05/24 0309 01/06/24 0454 01/07/24 0444 01/08/24 0359 01/09/24 0410 01/10/24 0353  AST 4,879* 3,883* 1,377* 574*  --   --   --   ALT 3,761* 3,712* 2,342* 1,586*  --   --   --   ALKPHOS 139* 146* 154* 144*  --   --   --   BILITOT 1.0 1.0 0.7 0.5  --   --   --   PROT 6.7 6.1* 6.0* 5.8*  --   --   --   ALBUMIN 3.0* 2.8*  2.7* 2.6* 2.7* 2.7* 2.7*   No results for input(s): "LIPASE", "AMYLASE" in the last 168 hours. Recent Labs  Lab 01/04/24 1816  AMMONIA 11   CBC: Recent Labs  Lab 01/04/24 1524 01/05/24 0309 01/06/24 0454 01/07/24 0444  WBC 7.4 6.2 4.4 5.3  NEUTROABS 5.2  --   --   --   HGB 9.7* 8.9* 9.3* 9.2*  HCT 30.5* 27.7* 29.7* 29.0*  MCV 93.0 93.6 93.4 93.5  PLT 184 171 167 190   Cardiac Enzymes: Recent Labs  Lab 01/06/24 0454  CKTOTAL 200   BNP: Invalid input(s): "POCBNP" CBG: No results for input(s): "GLUCAP" in the last 168 hours. D-Dimer No results for input(s): "DDIMER" in the last 72 hours. Hgb A1c No results for input(s): "HGBA1C" in the last 72 hours. Lipid Profile No results for input(s): "CHOL", "HDL", "LDLCALC", "TRIG", "CHOLHDL", "LDLDIRECT" in the last 72 hours. Thyroid function studies No results for input(s): "TSH", "T4TOTAL", "T3FREE", "THYROIDAB" in the last 72 hours.  Invalid input(s): "FREET3" Anemia work up No results for input(s): "VITAMINB12", "FOLATE", "FERRITIN", "TIBC", "IRON", "RETICCTPCT" in the last 72 hours. Urinalysis    Component Value Date/Time   COLORURINE YELLOW 01/04/2024 1700   APPEARANCEUR CLOUDY (A) 01/04/2024 1700   LABSPEC 1.010 01/04/2024 1700   PHURINE 5.0 01/04/2024 1700   GLUCOSEU NEGATIVE 01/04/2024 1700   HGBUR SMALL (  A) 01/04/2024 1700   BILIRUBINUR NEGATIVE 01/04/2024 1700   KETONESUR NEGATIVE 01/04/2024 1700   PROTEINUR 100 (A) 01/04/2024 1700   UROBILINOGEN 0.2 09/11/2014 1319   NITRITE NEGATIVE 01/04/2024 1700   LEUKOCYTESUR LARGE (A) 01/04/2024 1700   Sepsis Labs Recent Labs  Lab 01/04/24 1524 01/05/24 0309 01/06/24 0454 01/07/24 0444  WBC 7.4 6.2 4.4 5.3   Microbiology Recent Results (from the past 240 hours)  Resp panel by RT-PCR (RSV, Flu A&B, Covid) Anterior Nasal Swab     Status: None   Collection Time: 01/04/24  4:13 PM   Specimen: Anterior Nasal Swab  Result Value Ref Range Status   SARS  Coronavirus 2 by RT PCR NEGATIVE NEGATIVE Final    Comment: (NOTE) SARS-CoV-2 target nucleic acids are NOT DETECTED.  The SARS-CoV-2 RNA is generally detectable in upper respiratory specimens during the acute phase of infection. The lowest concentration of SARS-CoV-2 viral copies this assay can detect is 138 copies/mL. A negative result does not preclude SARS-Cov-2 infection and should not be used as the sole basis for treatment or other patient management decisions. A negative result may occur with  improper specimen collection/handling, submission of specimen other than nasopharyngeal swab, presence of viral mutation(s) within the areas targeted by this assay, and inadequate number of viral copies(<138 copies/mL). A negative result must be combined with clinical observations, patient history, and epidemiological information. The expected result is Negative.  Fact Sheet for Patients:  BloggerCourse.com  Fact Sheet for Healthcare Providers:  SeriousBroker.it  This test is no t yet approved or cleared by the [place] FDA and  has been authorized for detection and/or diagnosis of SARS-CoV-2 by FDA under an Emergency Use Authorization (EUA). This EUA will remain  in effect (meaning this test can be used) for the duration of the COVID-19 declaration under Section 564(b)(1) of the Act, 21 U.S.C.section 360bbb-3(b)(1), unless the authorization is terminated  or revoked sooner.       Influenza A by PCR NEGATIVE NEGATIVE Final   Influenza B by PCR NEGATIVE NEGATIVE Final    Comment: (NOTE) The Xpert Xpress SARS-CoV-2/FLU/RSV plus assay is intended as an aid in the diagnosis of influenza from Nasopharyngeal swab specimens and should not be used as a sole basis for treatment. Nasal washings and aspirates are unacceptable for Xpert Xpress SARS-CoV-2/FLU/RSV testing.  Fact Sheet for  Patients: BloggerCourse.com  Fact Sheet for Healthcare Providers: SeriousBroker.it  This test is not yet approved or cleared by the [place] FDA and has been authorized for detection and/or diagnosis of SARS-CoV-2 by FDA under an Emergency Use Authorization (EUA). This EUA will remain in effect (meaning this test can be used) for the duration of the COVID-19 declaration under Section 564(b)(1) of the Act, 21 U.S.C. section 360bbb-3(b)(1), unless the authorization is terminated or revoked.     Resp Syncytial Virus by PCR NEGATIVE NEGATIVE Final    Comment: (NOTE) Fact Sheet for Patients: BloggerCourse.com  Fact Sheet for Healthcare Providers: SeriousBroker.it  This test is not yet approved or cleared by the [place] FDA and has been authorized for detection and/or diagnosis of SARS-CoV-2 by FDA under an Emergency Use Authorization (EUA). This EUA will remain in effect (meaning this test can be used) for the duration of the COVID-19 declaration under Section 564(b)(1) of the Act, 21 U.S.C. section 360bbb-3(b)(1), unless the authorization is terminated or revoked.  Performed at [facility], [address], [place], [place] [address]   MRSA Next Gen by PCR, Nasal  Status: None   Collection Time: 01/05/24 12:25 PM   Specimen: Nasal Mucosa; Nasal Swab  Result Value Ref Range Status   MRSA by PCR Next Gen NOT DETECTED NOT DETECTED Final    Comment: (NOTE) The GeneXpert MRSA Assay (FDA approved for NASAL specimens only), is one component of a comprehensive MRSA colonization surveillance program. It is not intended to diagnose MRSA infection nor to guide or monitor treatment for MRSA infections. Test performance is not FDA approved in patients less than 54 years old. Performed at [facility], [address], [place], [place] [address]     Time coordinating discharge:  38 mins  SIGNED:  Standley Dakins, MD  [facility] 01/10/2024, 10:52 AM How to contact the [facility] Attending or Consulting provider 7A - 7P or covering provider during after hours 7P -7A, for this patient?  Check the care team in [facility] and look for a) attending/consulting TRH provider listed and b) the [facility] team listed Log into [email or web address] and use [place]'s universal password to access. If you do not have the password, please contact the hospital operator. Locate the [facility] provider you are looking for under [facility] and page to a number that you can be directly reached. If you still have difficulty reaching the provider, please page the [facility] (Director on Call) for the Hospitalists listed on amion for assistance.

## 2024-01-10 NOTE — Discharge Instructions (Signed)
 IMPORTANT INFORMATION: PAY CLOSE ATTENTION   PHYSICIAN DISCHARGE INSTRUCTIONS  Follow with Primary care provider  Christene Lye, FNP  and other consultants as instructed by your Hospitalist Physician  SEEK MEDICAL CARE OR RETURN TO EMERGENCY ROOM IF SYMPTOMS COME BACK, WORSEN OR NEW PROBLEM DEVELOPS   Please note: You were cared for by a hospitalist during your hospital stay. Every effort will be made to forward records to your primary care provider.  You can request that your primary care provider send for your hospital records if they have not received them.  Once you are discharged, your primary care physician will handle any further medical issues. Please note that NO REFILLS for any discharge medications will be authorized once you are discharged, as it is imperative that you return to your primary care physician (or establish a relationship with a primary care physician if you do not have one) for your post hospital discharge needs so that they can reassess your need for medications and monitor your lab values.  Please get a complete blood count and chemistry panel checked by your Primary MD at your next visit, and again as instructed by your Primary MD.  Get Medicines reviewed and adjusted: Please take all your medications with you for your next visit with your Primary MD  Laboratory/radiological data: Please request your Primary MD to go over all hospital tests and procedure/radiological results at the follow up, please ask your primary care provider to get all Hospital records sent to his/her office.  In some cases, they will be blood work, cultures and biopsy results pending at the time of your discharge. Please request that your primary care provider follow up on these results.  If you are diabetic, please bring your blood sugar readings with you to your follow up appointment with primary care.    Please call and make your follow up appointments as soon as possible.    Also Note  the following: If you experience worsening of your admission symptoms, develop shortness of breath, life threatening emergency, suicidal or homicidal thoughts you must seek medical attention immediately by calling 911 or calling your MD immediately  if symptoms less severe.  You must read complete instructions/literature along with all the possible adverse reactions/side effects for all the Medicines you take and that have been prescribed to you. Take any new Medicines after you have completely understood and accpet all the possible adverse reactions/side effects.   Do not drive when taking Pain medications or sleeping medications (Benzodiazepines)  Do not take more than prescribed Pain, Sleep and Anxiety Medications. It is not advisable to combine anxiety,sleep and pain medications without talking with your primary care practitioner  Special Instructions: If you have smoked or chewed Tobacco  in the last 2 yrs please stop smoking, stop any regular Alcohol  and or any Recreational drug use.  Wear Seat belts while driving.  Do not drive if taking any narcotic, mind altering or controlled substances or recreational drugs or alcohol.

## 2024-01-11 ENCOUNTER — Other Ambulatory Visit: Payer: Self-pay | Admitting: Family Medicine

## 2024-01-11 ENCOUNTER — Other Ambulatory Visit: Payer: Self-pay | Admitting: *Deleted

## 2024-01-11 DIAGNOSIS — R7989 Other specified abnormal findings of blood chemistry: Secondary | ICD-10-CM

## 2024-01-11 MED ORDER — TORSEMIDE 20 MG PO TABS: 20.0000 mg | ORAL_TABLET | Freq: Every day | ORAL | 0 refills | Status: AC

## 2024-01-11 MED ORDER — AMLODIPINE BESYLATE 5 MG PO TABS: 5.0000 mg | ORAL_TABLET | Freq: Every day | ORAL | 1 refills | Status: AC

## 2024-01-11 MED ORDER — POLYETHYLENE GLYCOL 3350 17 G PO PACK: 17.0000 g | PACK | Freq: Every day | ORAL | 0 refills | Status: AC

## 2024-01-11 MED ORDER — METOPROLOL SUCCINATE ER 25 MG PO TB24: 25.0000 mg | ORAL_TABLET | Freq: Every day | ORAL | 0 refills | Status: AC

## 2024-01-11 NOTE — Progress Notes (Signed)
 ALF requesting pharmacy change for meds to [facility] [address].  [facility]xs [facility] to [facility].  Maryln Manuel MD

## 2024-01-11 NOTE — Telephone Encounter (Signed)
 Noted.

## 2024-01-11 NOTE — Telephone Encounter (Signed)
 Spoke to nurse at [facility] informed her of recommendations. She voiced understanding. Labs entered into Epic. Faxed to facility. Pt needs OV

## 2024-01-12 ENCOUNTER — Telehealth: Payer: Self-pay

## 2024-01-12 DIAGNOSIS — L608 Other nail disorders: Secondary | ICD-10-CM | POA: Diagnosis not present

## 2024-01-12 DIAGNOSIS — M79674 Pain in right toe(s): Secondary | ICD-10-CM | POA: Diagnosis not present

## 2024-01-12 DIAGNOSIS — L84 Corns and callosities: Secondary | ICD-10-CM | POA: Diagnosis not present

## 2024-01-12 DIAGNOSIS — B351 Tinea unguium: Secondary | ICD-10-CM | POA: Diagnosis not present

## 2024-01-12 DIAGNOSIS — M79675 Pain in left toe(s): Secondary | ICD-10-CM | POA: Diagnosis not present

## 2024-01-12 DIAGNOSIS — R2689 Other abnormalities of gait and mobility: Secondary | ICD-10-CM | POA: Diagnosis not present

## 2024-01-12 DIAGNOSIS — I739 Peripheral vascular disease, unspecified: Secondary | ICD-10-CM | POA: Diagnosis not present

## 2024-01-12 NOTE — Telephone Encounter (Signed)
 Spoke with representative of [facility] in re: to getting patient back on the surgical schedule for L arm AVF VS AVG.  Patient was just released from [facility].  [facility] rep will call this nurse back for scheduling.

## 2024-01-12 NOTE — Consult Note (Signed)
 [facility] Liaison Note  01/12/2024  [name] 14-Mar-1966 [id number]  Location: RN Hospital Liaison screened the patient remotely at [facility].  Insurance: [employer]   [name] is a 58 y.o. female who is a Primary Care Patient of Shokes, Toniann Fail, FNP ([facility]). The patient was screened for  readmission hospitalization with noted extreme risk score for unplanned readmission risk with 1 IP/1 ED in 6 months.  The patient was assessed for potential Care Management service needs for post hospital transition for care coordination. Review of patient's electronic medical record reveals patient was admitted for Gallbladder Sludge. Pt discharged to ALF. Facility will continue to support her needs. Pt screened due to green banner and has an unaffiliated provider not eligible for VBCI services at this time.    VBCI Care Management/Population Health does not replace or interfere with any arrangements made by the Inpatient Transition of Care team.   For questions contact:   Elliot Cousin, RN, BSN Hospital Liaison [place]   [facility], Population Health Office Hours MTWF  8:00 am-6:00 pm Direct Dial: [phone] mobile [email or web address]

## 2024-01-13 DIAGNOSIS — G4700 Insomnia, unspecified: Secondary | ICD-10-CM | POA: Diagnosis not present

## 2024-01-13 DIAGNOSIS — J449 Chronic obstructive pulmonary disease, unspecified: Secondary | ICD-10-CM | POA: Diagnosis not present

## 2024-01-13 DIAGNOSIS — N184 Chronic kidney disease, stage 4 (severe): Secondary | ICD-10-CM | POA: Diagnosis not present

## 2024-01-13 DIAGNOSIS — E46 Unspecified protein-calorie malnutrition: Secondary | ICD-10-CM | POA: Diagnosis not present

## 2024-01-13 DIAGNOSIS — R296 Repeated falls: Secondary | ICD-10-CM | POA: Diagnosis not present

## 2024-01-13 DIAGNOSIS — R269 Unspecified abnormalities of gait and mobility: Secondary | ICD-10-CM | POA: Diagnosis not present

## 2024-01-13 DIAGNOSIS — K838 Other specified diseases of biliary tract: Secondary | ICD-10-CM | POA: Diagnosis not present

## 2024-01-13 DIAGNOSIS — K219 Gastro-esophageal reflux disease without esophagitis: Secondary | ICD-10-CM | POA: Diagnosis not present

## 2024-01-13 DIAGNOSIS — E785 Hyperlipidemia, unspecified: Secondary | ICD-10-CM | POA: Diagnosis not present

## 2024-01-13 DIAGNOSIS — I480 Paroxysmal atrial fibrillation: Secondary | ICD-10-CM | POA: Diagnosis not present

## 2024-01-13 DIAGNOSIS — M48061 Spinal stenosis, lumbar region without neurogenic claudication: Secondary | ICD-10-CM | POA: Diagnosis not present

## 2024-01-13 DIAGNOSIS — E1151 Type 2 diabetes mellitus with diabetic peripheral angiopathy without gangrene: Secondary | ICD-10-CM | POA: Diagnosis not present

## 2024-01-13 DIAGNOSIS — E1122 Type 2 diabetes mellitus with diabetic chronic kidney disease: Secondary | ICD-10-CM | POA: Diagnosis not present

## 2024-01-13 DIAGNOSIS — J9611 Chronic respiratory failure with hypoxia: Secondary | ICD-10-CM | POA: Diagnosis not present

## 2024-01-13 DIAGNOSIS — N179 Acute kidney failure, unspecified: Secondary | ICD-10-CM | POA: Diagnosis not present

## 2024-01-13 DIAGNOSIS — K819 Cholecystitis, unspecified: Secondary | ICD-10-CM | POA: Diagnosis not present

## 2024-01-13 DIAGNOSIS — I5042 Chronic combined systolic (congestive) and diastolic (congestive) heart failure: Secondary | ICD-10-CM | POA: Diagnosis not present

## 2024-01-13 DIAGNOSIS — I130 Hypertensive heart and chronic kidney disease with heart failure and stage 1 through stage 4 chronic kidney disease, or unspecified chronic kidney disease: Secondary | ICD-10-CM | POA: Diagnosis not present

## 2024-01-13 DIAGNOSIS — G894 Chronic pain syndrome: Secondary | ICD-10-CM | POA: Diagnosis not present

## 2024-01-13 DIAGNOSIS — M6281 Muscle weakness (generalized): Secondary | ICD-10-CM | POA: Diagnosis not present

## 2024-01-13 DIAGNOSIS — D631 Anemia in chronic kidney disease: Secondary | ICD-10-CM | POA: Diagnosis not present

## 2024-01-13 DIAGNOSIS — K829 Disease of gallbladder, unspecified: Secondary | ICD-10-CM | POA: Diagnosis not present

## 2024-01-20 DIAGNOSIS — J449 Chronic obstructive pulmonary disease, unspecified: Secondary | ICD-10-CM | POA: Diagnosis not present

## 2024-01-20 DIAGNOSIS — J9611 Chronic respiratory failure with hypoxia: Secondary | ICD-10-CM | POA: Diagnosis not present

## 2024-01-20 DIAGNOSIS — K829 Disease of gallbladder, unspecified: Secondary | ICD-10-CM | POA: Diagnosis not present

## 2024-01-20 DIAGNOSIS — I480 Paroxysmal atrial fibrillation: Secondary | ICD-10-CM | POA: Diagnosis not present

## 2024-01-20 DIAGNOSIS — E785 Hyperlipidemia, unspecified: Secondary | ICD-10-CM | POA: Diagnosis not present

## 2024-01-20 DIAGNOSIS — I130 Hypertensive heart and chronic kidney disease with heart failure and stage 1 through stage 4 chronic kidney disease, or unspecified chronic kidney disease: Secondary | ICD-10-CM | POA: Diagnosis not present

## 2024-01-20 DIAGNOSIS — N179 Acute kidney failure, unspecified: Secondary | ICD-10-CM | POA: Diagnosis not present

## 2024-01-20 DIAGNOSIS — N184 Chronic kidney disease, stage 4 (severe): Secondary | ICD-10-CM | POA: Diagnosis not present

## 2024-01-20 DIAGNOSIS — R269 Unspecified abnormalities of gait and mobility: Secondary | ICD-10-CM | POA: Diagnosis not present

## 2024-01-20 DIAGNOSIS — E46 Unspecified protein-calorie malnutrition: Secondary | ICD-10-CM | POA: Diagnosis not present

## 2024-01-20 DIAGNOSIS — K219 Gastro-esophageal reflux disease without esophagitis: Secondary | ICD-10-CM | POA: Diagnosis not present

## 2024-01-20 DIAGNOSIS — E1122 Type 2 diabetes mellitus with diabetic chronic kidney disease: Secondary | ICD-10-CM | POA: Diagnosis not present

## 2024-01-20 DIAGNOSIS — E1151 Type 2 diabetes mellitus with diabetic peripheral angiopathy without gangrene: Secondary | ICD-10-CM | POA: Diagnosis not present

## 2024-01-20 DIAGNOSIS — M6281 Muscle weakness (generalized): Secondary | ICD-10-CM | POA: Diagnosis not present

## 2024-01-20 DIAGNOSIS — D631 Anemia in chronic kidney disease: Secondary | ICD-10-CM | POA: Diagnosis not present

## 2024-01-20 DIAGNOSIS — I5042 Chronic combined systolic (congestive) and diastolic (congestive) heart failure: Secondary | ICD-10-CM | POA: Diagnosis not present

## 2024-01-20 DIAGNOSIS — M48061 Spinal stenosis, lumbar region without neurogenic claudication: Secondary | ICD-10-CM | POA: Diagnosis not present

## 2024-01-20 DIAGNOSIS — G894 Chronic pain syndrome: Secondary | ICD-10-CM | POA: Diagnosis not present

## 2024-01-20 DIAGNOSIS — K838 Other specified diseases of biliary tract: Secondary | ICD-10-CM | POA: Diagnosis not present

## 2024-01-20 DIAGNOSIS — R296 Repeated falls: Secondary | ICD-10-CM | POA: Diagnosis not present

## 2024-01-20 DIAGNOSIS — G4700 Insomnia, unspecified: Secondary | ICD-10-CM | POA: Diagnosis not present

## 2024-01-24 DIAGNOSIS — N184 Chronic kidney disease, stage 4 (severe): Secondary | ICD-10-CM | POA: Diagnosis not present

## 2024-01-24 DIAGNOSIS — M48061 Spinal stenosis, lumbar region without neurogenic claudication: Secondary | ICD-10-CM | POA: Diagnosis not present

## 2024-01-24 DIAGNOSIS — G4700 Insomnia, unspecified: Secondary | ICD-10-CM | POA: Diagnosis not present

## 2024-01-24 DIAGNOSIS — D631 Anemia in chronic kidney disease: Secondary | ICD-10-CM | POA: Diagnosis not present

## 2024-01-24 DIAGNOSIS — K219 Gastro-esophageal reflux disease without esophagitis: Secondary | ICD-10-CM | POA: Diagnosis not present

## 2024-01-24 DIAGNOSIS — M6281 Muscle weakness (generalized): Secondary | ICD-10-CM | POA: Diagnosis not present

## 2024-01-24 DIAGNOSIS — I5042 Chronic combined systolic (congestive) and diastolic (congestive) heart failure: Secondary | ICD-10-CM | POA: Diagnosis not present

## 2024-01-24 DIAGNOSIS — I130 Hypertensive heart and chronic kidney disease with heart failure and stage 1 through stage 4 chronic kidney disease, or unspecified chronic kidney disease: Secondary | ICD-10-CM | POA: Diagnosis not present

## 2024-01-24 DIAGNOSIS — E1151 Type 2 diabetes mellitus with diabetic peripheral angiopathy without gangrene: Secondary | ICD-10-CM | POA: Diagnosis not present

## 2024-01-24 DIAGNOSIS — J9611 Chronic respiratory failure with hypoxia: Secondary | ICD-10-CM | POA: Diagnosis not present

## 2024-01-24 DIAGNOSIS — E1122 Type 2 diabetes mellitus with diabetic chronic kidney disease: Secondary | ICD-10-CM | POA: Diagnosis not present

## 2024-01-24 DIAGNOSIS — K838 Other specified diseases of biliary tract: Secondary | ICD-10-CM | POA: Diagnosis not present

## 2024-01-24 DIAGNOSIS — N179 Acute kidney failure, unspecified: Secondary | ICD-10-CM | POA: Diagnosis not present

## 2024-01-24 DIAGNOSIS — I480 Paroxysmal atrial fibrillation: Secondary | ICD-10-CM | POA: Diagnosis not present

## 2024-01-24 DIAGNOSIS — J449 Chronic obstructive pulmonary disease, unspecified: Secondary | ICD-10-CM | POA: Diagnosis not present

## 2024-01-24 DIAGNOSIS — G894 Chronic pain syndrome: Secondary | ICD-10-CM | POA: Diagnosis not present

## 2024-01-24 DIAGNOSIS — K829 Disease of gallbladder, unspecified: Secondary | ICD-10-CM | POA: Diagnosis not present

## 2024-01-24 DIAGNOSIS — E46 Unspecified protein-calorie malnutrition: Secondary | ICD-10-CM | POA: Diagnosis not present

## 2024-01-24 DIAGNOSIS — R296 Repeated falls: Secondary | ICD-10-CM | POA: Diagnosis not present

## 2024-01-24 DIAGNOSIS — R269 Unspecified abnormalities of gait and mobility: Secondary | ICD-10-CM | POA: Diagnosis not present

## 2024-01-24 DIAGNOSIS — E785 Hyperlipidemia, unspecified: Secondary | ICD-10-CM | POA: Diagnosis not present

## 2024-01-26 DIAGNOSIS — R808 Other proteinuria: Secondary | ICD-10-CM | POA: Diagnosis not present

## 2024-01-26 DIAGNOSIS — E1129 Type 2 diabetes mellitus with other diabetic kidney complication: Secondary | ICD-10-CM | POA: Diagnosis not present

## 2024-01-26 DIAGNOSIS — N185 Chronic kidney disease, stage 5: Secondary | ICD-10-CM | POA: Diagnosis not present

## 2024-01-26 DIAGNOSIS — N2581 Secondary hyperparathyroidism of renal origin: Secondary | ICD-10-CM | POA: Diagnosis not present

## 2024-01-27 DIAGNOSIS — M6281 Muscle weakness (generalized): Secondary | ICD-10-CM | POA: Diagnosis not present

## 2024-01-27 DIAGNOSIS — R296 Repeated falls: Secondary | ICD-10-CM | POA: Diagnosis not present

## 2024-01-28 DIAGNOSIS — E1100 Type 2 diabetes mellitus with hyperosmolarity without nonketotic hyperglycemic-hyperosmolar coma (NKHHC): Secondary | ICD-10-CM | POA: Diagnosis not present

## 2024-01-28 DIAGNOSIS — R7401 Elevation of levels of liver transaminase levels: Secondary | ICD-10-CM | POA: Diagnosis not present

## 2024-01-31 DIAGNOSIS — E1122 Type 2 diabetes mellitus with diabetic chronic kidney disease: Secondary | ICD-10-CM | POA: Diagnosis not present

## 2024-01-31 DIAGNOSIS — J9611 Chronic respiratory failure with hypoxia: Secondary | ICD-10-CM | POA: Diagnosis not present

## 2024-01-31 DIAGNOSIS — E785 Hyperlipidemia, unspecified: Secondary | ICD-10-CM | POA: Diagnosis not present

## 2024-01-31 DIAGNOSIS — K838 Other specified diseases of biliary tract: Secondary | ICD-10-CM | POA: Diagnosis not present

## 2024-01-31 DIAGNOSIS — R269 Unspecified abnormalities of gait and mobility: Secondary | ICD-10-CM | POA: Diagnosis not present

## 2024-01-31 DIAGNOSIS — E1151 Type 2 diabetes mellitus with diabetic peripheral angiopathy without gangrene: Secondary | ICD-10-CM | POA: Diagnosis not present

## 2024-01-31 DIAGNOSIS — N184 Chronic kidney disease, stage 4 (severe): Secondary | ICD-10-CM | POA: Diagnosis not present

## 2024-01-31 DIAGNOSIS — E46 Unspecified protein-calorie malnutrition: Secondary | ICD-10-CM | POA: Diagnosis not present

## 2024-01-31 DIAGNOSIS — I700 Atherosclerosis of aorta: Secondary | ICD-10-CM | POA: Diagnosis not present

## 2024-01-31 DIAGNOSIS — K219 Gastro-esophageal reflux disease without esophagitis: Secondary | ICD-10-CM | POA: Diagnosis not present

## 2024-01-31 DIAGNOSIS — K829 Disease of gallbladder, unspecified: Secondary | ICD-10-CM | POA: Diagnosis not present

## 2024-01-31 DIAGNOSIS — M48061 Spinal stenosis, lumbar region without neurogenic claudication: Secondary | ICD-10-CM | POA: Diagnosis not present

## 2024-01-31 DIAGNOSIS — R296 Repeated falls: Secondary | ICD-10-CM | POA: Diagnosis not present

## 2024-01-31 DIAGNOSIS — G4700 Insomnia, unspecified: Secondary | ICD-10-CM | POA: Diagnosis not present

## 2024-01-31 DIAGNOSIS — I5042 Chronic combined systolic (congestive) and diastolic (congestive) heart failure: Secondary | ICD-10-CM | POA: Diagnosis not present

## 2024-01-31 DIAGNOSIS — D631 Anemia in chronic kidney disease: Secondary | ICD-10-CM | POA: Diagnosis not present

## 2024-01-31 DIAGNOSIS — M6281 Muscle weakness (generalized): Secondary | ICD-10-CM | POA: Diagnosis not present

## 2024-01-31 DIAGNOSIS — J449 Chronic obstructive pulmonary disease, unspecified: Secondary | ICD-10-CM | POA: Diagnosis not present

## 2024-01-31 DIAGNOSIS — I130 Hypertensive heart and chronic kidney disease with heart failure and stage 1 through stage 4 chronic kidney disease, or unspecified chronic kidney disease: Secondary | ICD-10-CM | POA: Diagnosis not present

## 2024-01-31 DIAGNOSIS — G894 Chronic pain syndrome: Secondary | ICD-10-CM | POA: Diagnosis not present

## 2024-01-31 DIAGNOSIS — I480 Paroxysmal atrial fibrillation: Secondary | ICD-10-CM | POA: Diagnosis not present

## 2024-02-01 ENCOUNTER — Ambulatory Visit (INDEPENDENT_AMBULATORY_CARE_PROVIDER_SITE_OTHER): Payer: 59 | Admitting: Neurology

## 2024-02-01 ENCOUNTER — Encounter: Payer: Self-pay | Admitting: Neurology

## 2024-02-01 VITALS — BP 182/110 | HR 98 | Ht 60.0 in | Wt 171.0 lb

## 2024-02-01 DIAGNOSIS — G40909 Epilepsy, unspecified, not intractable, without status epilepticus: Secondary | ICD-10-CM

## 2024-02-01 NOTE — Patient Instructions (Signed)
   Check EEG

## 2024-02-01 NOTE — Progress Notes (Addendum)
 Patient: [name] Date of Birth: Jun 07, 1966  Reason for Visit: Follow up History from: Patient Primary Neurologist: [name]   ASSESSMENT AND PLAN 58 y.o. year old female   1.  Symptomatic seizures in setting of diabetic ketoacidosis 2021; 2022 (hypoglycemia), neurology recommended taper off Keppra   2.  History of cocaine abuse and tobacco abuse   3.  Cerebrovascular disease, right brain stroke, left hemiparesis 2021  4.  Stage IV CKD  -She would like to consider coming off Keppra.  No seizure since seizure event in 2021, 2022 during significant illness.  Check EEG.  Depending on results of EEG, would consider discontinuing Keppra.  She has stage IV kidney disease, is being considered for hemodialysis.  For now, continue Keppra 500 mg twice a day. Strict management of vascular risk factors with a goal BP less than 130/90, A1c less than 7.0, LDL less than 70 for secondary stroke prevention.  Plan to follow-up 1 year or sooner if needed  Orders Placed This Encounter  Procedures   EEG adult   HISTORY OF PRESENT ILLNESS: Today 02/01/24 Here with [name], [name]. Remains on Keppra 500 mg twice daily. Her insulin has been discontinued, her blood sugars have been under good control, sugars running in the 120's. Planning to have dialysis, but creatinine improved, will recheck in June. No seizures reported. Is working with PT, wants to go live with her son. Going to have a sleep study. She would like to see about coming off Keppra. 3 years since drug and tobacco use.   11/05/22 [name]: Here today alone. [name] at [name]. Trying to get in to an assisted [name] facility. Remains on Keppra, has decided to remain on it. No seizures. Not sure about A1C, gets insulin. Has palliative care consult every 6-8 weeks. In a wheelchair, does very little walking, only during therapy. Still feels left side is weak. Claims she just got her ability  to manage her affairs back.   HISTORY  11/04/21 [name]: [name] is a 58 year old female with a history of a seizure event in October 2021 after being diagnosed with DKA.  Since then the patient has not had any seizure events.  At the last visit an EEG was ordered but never completed.  The patient states that she does not wish to come off of Keppra.  She does not want to repeat an EEG.  The patient is currently in a skilled nursing facility.  She denies any seizure events.  She returns today for follow-up with her sister   10/01/2020 [name]: [name] is a 58 year old right-handed black female with a history of diabetes, tobacco abuse, cocaine abuse, chronic renal insufficiency, atrial fibrillation, congestive heart failure, and recent onset of seizure.  The patient was admitted to the [name] on 08/13/2020, the patient claims that she was found at home having a seizure.  The patient was found to have diabetic ketoacidosis with a blood sugar of 772 on admission.  The patient admits to eating pure starch on a regular basis.  She was placed on Keppra in the [name], at least 2 EEG studies were done, both showing generalized slowing consistent with a metabolic encephalopathy.  The patient underwent lumbar puncture showing 10 white cells with a normal glucose level and a slightly elevated protein level, no evidence of meningitis or herpes encephalitis was identified.  The patient has been noted to have a small subcortical stroke in the right basal  ganglia area, she has had some left-sided weakness.  Since the hospitalization she has not been able to walk with some left-sided weakness.  The patient can take a few steps with a walker.  She wants to go home but she does not have any caretakers at home and she would require 24/7 assistance.  The patient initially had significant dysphagia but this is now improving.  The patient claims that her blood sugar remains in the 400 range but then admits to eating snacks  that she gets from her roommate in the evening.  The patient does have urinary frequency and some urinary incontinence.  She has occasional headaches, she denies any vision changes.  She has not had any further seizures since coming out of the [name].  She comes to this office for an evaluation.   REVIEW OF SYSTEMS: Out of a complete 14 system review of symptoms, the patient complains only of the following symptoms, and all other reviewed systems are negative.  See HPI  ALLERGIES: Allergies  Allergen Reactions   Sodium Hypochlorite Anaphylaxis    (Bleach wipes) "I am not able to breathe"    HOME MEDICATIONS: Outpatient Medications Prior to Visit  Medication Sig Dispense Refill   acetaminophen (TYLENOL) 325 MG tablet Take 2 tablets (650 mg total) by mouth every 6 (six) hours as needed for headache, fever or mild pain.     amLODipine (NORVASC) 5 MG tablet Take 1 tablet (5 mg total) by mouth daily. 30 tablet 1   ammonium lactate (AMLACTIN) 12 % lotion Apply 1 Application topically as needed for dry skin. 400 g 4   apixaban (ELIQUIS) 5 MG TABS tablet Take 1 tablet (5 mg total) by mouth 2 (two) times daily. 60 tablet 0   ARIPiprazole (ABILIFY) 5 MG tablet Take 1 tablet (5 mg total) by mouth daily. 60 tablet 0   azelastine (OPTIVAR) 0.05 % ophthalmic solution Place 1 drop into both eyes 2 (two) times daily.     calcitRIOL (ROCALTROL) 0.25 MCG capsule Take 1 capsule (0.25 mcg total) by mouth daily. 30 capsule 0   FLUoxetine (PROZAC) 40 MG capsule Take 2 pills once a day 60 capsule 3   fluticasone (FLONASE) 50 MCG/ACT nasal spray Place 2 sprays into both nostrils daily. 16 g 6   levETIRAcetam (KEPPRA) 500 MG tablet Take 1 tablet (500 mg total) by mouth 2 (two) times daily. 60 tablet 0   loperamide (IMODIUM) 2 MG capsule Take 4 mg by mouth every 4 (four) hours as needed for diarrhea or loose stools.     metoprolol succinate (TOPROL-XL) 25 MG 24 hr tablet Take 1 tablet (25 mg total) by mouth  daily. 30 tablet 0   olopatadine (PATADAY) 0.1 % ophthalmic solution Place 1 drop into both eyes 2 (two) times daily.     ondansetron (ZOFRAN-ODT) 4 MG disintegrating tablet Take 4 mg by mouth every 8 (eight) hours as needed for nausea or vomiting.     oxybutynin (DITROPAN-XL) 5 MG 24 hr tablet Take 1 tablet (5 mg total) by mouth at bedtime.     polyethylene glycol (MIRALAX / GLYCOLAX) 17 g packet Take 17 g by mouth daily. 30 each 0   thiamine (VITAMIN B1) 100 MG tablet TAKE (1) TABLET BY MOUTH ONCE DAILY. 30 tablet 0   torsemide (DEMADEX) 20 MG tablet Take 1 tablet (20 mg total) by mouth daily. 30 tablet 0   No facility-administered medications prior to visit.    PAST MEDICAL HISTORY: Past Medical  History:  Diagnosis Date   Acute lower UTI 06/04/2018   Acute metabolic encephalopathy 03/11/2021   Acute on chronic respiratory failure with hypoxia (HCC) 07/20/2021   Acute respiratory failure requiring reintubation (HCC) 11/09/2012   Acute respiratory failure with hypoxia (HCC) 12/03/2020   AKI (acute kidney injury) (HCC) 06/04/2018   Alcohol use    Ankle fracture, lateral malleolus, closed 2013   Anxiety    Aortic atherosclerosis (HCC) 03/10/2021   Atherosclerosis of native artery of both legs with gangrene (HCC)    Breast mass, left 2013   CHF (congestive heart failure) (HCC)    a. EF 20-25% by echo in 05/2016 with cath showing normal cors b. EF 50-55% in 07/2020 c. 01/2021: EF at 55-60% with moderate LVH   Chronic anemia    CKD (chronic kidney disease), stage III (HCC)    Cocaine abuse (HCC)    COPD (chronic obstructive pulmonary disease) (HCC)    Diabetes mellitus, type 2 (HCC)    Diabetic Charcot foot (HCC)    DKA (diabetic ketoacidosis) (HCC) 10/29/2020   Essential hypertension    H/O open hand wound 11/22/2018   History of cardiomyopathy    History of GI bleed 2011   Hyperlipidemia    Lactic acid acidosis 09/11/2014   Metabolic encephalopathy 03/11/2021   Noncompliance     Obesity    Oral candidiasis 03/10/2021   PAF (paroxysmal atrial fibrillation) (HCC)    Panic attacks    PAT (paroxysmal atrial tachycardia) (HCC)    Previously on Amiodarone   Pneumonia due to COVID-19 virus 12/03/2020   Seizures (HCC) 10/01/2020   Sepsis due to skin infection (HCC) 04/01/2021   Shock (HCC) 10/29/2020   Sleep apnea    Not on CPAP   Stroke (HCC) 2018   Thrombocytosis 08/02/2017   Tobacco abuse    UGI bleed    Urinary incontinence     PAST SURGICAL HISTORY: Past Surgical History:  Procedure Laterality Date   ANGIOPLASTY ILLIAC ARTERY Right 04/23/2021   Procedure: ANGIOPLASTY AND STENT SUPERFICIAL FEMORAL ARTERY;  Surgeon: Leonie Douglas, MD;  Location: [facility] OR;  Service: Vascular;  Laterality: Right;   AORTOGRAM  04/23/2021   Procedure: AORTOGRAM;  Surgeon: Leonie Douglas, MD;  Location: [facility] OR;  Service: Vascular;;   AORTOGRAM Left 04/30/2021   Procedure: left lower extremity angiogram with second order cannulation;  Surgeon: Leonie Douglas, MD;  Location: [facility] OR;  Service: Vascular;  Laterality: Left;   BIOPSY  11/12/2020   Procedure: BIOPSY;  Surgeon: Dolores Frame, MD;  Location: [facility] SUITE;  Service: Gastroenterology;;   BREAST BIOPSY     CARDIAC CATHETERIZATION N/A 07/28/2016   Procedure: Left Heart Cath and Coronary Angiography;  Surgeon: Corky Crafts, MD;  Location: [facility] INVASIVE CV LAB;  Service: Cardiovascular;  Laterality: N/A;   COLONOSCOPY N/A 05/10/2019   Procedure: COLONOSCOPY;  Surgeon: West Bali, MD;  Location: [facility];  Service: Endoscopy;  Laterality: N/A;  Phenergan 12.5 mg IV in pre-op   DILATION AND CURETTAGE OF UTERUS     ESOPHAGOGASTRODUODENOSCOPY (EGD) WITH PROPOFOL N/A 11/12/2020   Procedure: ESOPHAGOGASTRODUODENOSCOPY (EGD) WITH PROPOFOL;  Surgeon: Dolores Frame, MD;  [facility];  Service: Gastroenterology;  Laterality: N/A;   I & D EXTREMITY Bilateral 09/22/2017   Procedure:  BILATERAL DEBRIDEMENT LEG/FOOT ULCERS, APPLY VERAFLO WOUND VAC;  Surgeon: Nadara Mustard, MD;  Location: [facility] OR;  Service: Orthopedics;  Laterality: Bilateral;   I & D EXTREMITY  Right 10/11/2018   Procedure: IRRIGATION AND DEBRIDEMENT RIGHT HAND;  Surgeon: Dominica Severin, MD;  Location: [facility] OR;  Service: Orthopedics;  Laterality: Right;   I & D EXTREMITY Right 10/13/2018   Procedure: REPEAT IRRIGATION AND DEBRIDEMENT RIGHT HAND;  Surgeon: Dominica Severin, MD;  Location: [facility] OR;  Service: Orthopedics;  Laterality: Right;   I & D EXTREMITY Right 11/22/2018   Procedure: IRRIGATION AND DEBRIDEMENT AND PINNING RIGHT HAND;  Surgeon: Dominica Severin, MD;  Location: [facility] OR;  Service: Orthopedics;  Laterality: Right;   IR RADIOLOGIST EVAL & MGMT  07/05/2018   LOWER EXTREMITY ANGIOGRAM Bilateral 04/23/2021   Procedure: RIGHT  AND LEFT LOWER EXTREMITY ANGIOGRAM;  Surgeon: Leonie Douglas, MD;  Location: [name] OR;  Service: Vascular;  Laterality: Bilateral;   POLYPECTOMY  05/10/2019   Procedure: POLYPECTOMY;  Surgeon: West Bali, MD;  [facility];  Service: Endoscopy;;   SKIN SPLIT GRAFT Bilateral 09/28/2017   Procedure: BILATERAL SPLIT THICKNESS SKIN GRAFT LEGS/FEET AND APPLY VAC;  Surgeon: Nadara Mustard, MD;  Location: [facility] OR;  Service: Orthopedics;  Laterality: Bilateral;   SKIN SPLIT GRAFT Right 11/22/2018   Procedure: SKIN GRAFT SPLIT THICKNESS;  Surgeon: Dominica Severin, MD;  Location: [facility] OR;  Service: Orthopedics;  Laterality: Right;    FAMILY HISTORY: Family History  Problem Relation Age of Onset   Hypertension Mother    Heart attack Mother    Hypertension Father        CABG    Hypertension Sister    Hypertension Brother    Hypertension Sister    Cancer Sister        breast    Arthritis Other    Cancer Other    Diabetes Other    Asthma Other    Hypertension Daughter    Hypertension Son     SOCIAL HISTORY: Social History   Socioeconomic History   Marital status: Widowed     Spouse name: Not on file   Number of children: 2   Years of education: 9   Highest education level: 9th grade  Occupational History   Occupation: Disabled  Tobacco Use   Smoking status: Former    Current packs/day: 0.00    Average packs/day: 0.3 packs/day for 20.0 years (5.0 ttl pk-yrs)    Types: Cigarettes    Start date: 06/03/1997    Quit date: 06/03/2017    Years since quitting: 6.6    Passive exposure: Never   Smokeless tobacco: Never  Vaping Use   Vaping status: Never Used  Substance and Sexual Activity   Alcohol use: Not Currently    Alcohol/week: 0.0 standard drinks of alcohol    Comment: Quit in 2017   Drug use: Not Currently    Frequency: 3.0 times per week    Types: Marijuana, Cocaine    Comment: none since August 2018   Sexual activity: Not on file  Other Topics Concern   Not on file  Social History Narrative   Right handed   Drinks 1-2 cups caffeine daily   Social Drivers of Health   Financial Resource Strain: Medium Risk (10/15/2020)   Overall Financial Resource Strain (CARDIA)    Difficulty of Paying [name] Expenses: Somewhat hard  Food Insecurity: No Food Insecurity (01/04/2024)   Hunger Vital Sign    Worried About Running Out of Food in the Last Year: Never true    Ran Out of Food in the Last Year: Never true  Transportation Needs: No Transportation Needs (01/04/2024)  PRAPARE - Administrator, Civil Service (Medical): No    Lack of Transportation (Non-Medical): No  Physical Activity: Insufficiently Active (10/15/2020)   Exercise Vital Sign    Days of Exercise per Week: 7 days    Minutes of Exercise per Session: 20 min  Stress: Stress Concern Present (10/15/2020)   [employer] of Occupational Health - Occupational Stress Questionnaire    Feeling of Stress : To some extent  Social Connections: Moderately Isolated (10/15/2020)   Social Connection and Isolation Panel [NHANES]    Frequency of Communication with Friends and Family: More  than three times a week    Frequency of Social Gatherings with Friends and Family: More than three times a week    Attends Religious Services: More than 4 times per year    Active Member of [employer] or Organizations: No    Attends Banker Meetings: Never    Marital Status: Widowed  Intimate Partner Violence: Not At Risk (01/04/2024)   Humiliation, Afraid, Rape, and Kick questionnaire    Fear of Current or Ex-Partner: No    Emotionally Abused: No    Physically Abused: No    Sexually Abused: No   PHYSICAL EXAM  Vitals:   02/01/24 1244  BP: (!) 167/110  Pulse: 98  Weight: 171 lb (77.6 kg)  Height: 5' (1.524 m)    Body mass index is 33.4 kg/m.  Generalized: Well developed, in no acute distress  Neurological examination  Mentation: Alert oriented to time, place, history taking. Follows all commands speech and language fluent Cranial nerve II-XII: Pupils were equal round reactive to light. Extraocular movements were full, visual field were full on confrontational test. Facial sensation and strength were normal.  Head turning and shoulder shrug  were normal and symmetric. Motor: Overall good strength, mild weakness to left arm and leg Sensory: Sensory testing is intact to soft touch on all 4 extremities. No evidence of extinction is noted.  Coordination: Cerebellar testing reveals good finger-nose-finger and heel-to-shin bilaterally.  Gait and station: In a wheelchair, is nonambulatory, with standing is unsteady without walker   DIAGNOSTIC DATA (LABS, IMAGING, TESTING) - I reviewed patient records, labs, notes, testing and imaging myself where available.  Lab Results  Component Value Date   WBC 5.3 01/07/2024   HGB 9.2 (L) 01/07/2024   HCT 29.0 (L) 01/07/2024   MCV 93.5 01/07/2024   PLT 190 01/07/2024      Component Value Date/Time   NA 139 01/10/2024 0353   NA 141 03/31/2023 0828   K 4.4 01/10/2024 0353   CL 111 01/10/2024 0353   CO2 20 (L) 01/10/2024 0353    GLUCOSE 136 (H) 01/10/2024 0353   BUN 65 (H) 01/10/2024 0353   BUN 48 (H) 03/31/2023 0828   CREATININE 3.34 (H) 01/10/2024 0353   CREATININE 1.56 (H) 07/26/2018 1513   CALCIUM 8.8 (L) 01/10/2024 0353   CALCIUM 8.9 09/13/2014 0703   PROT 5.8 (L) 01/07/2024 0444   PROT 5.5 (L) 03/31/2023 0828   ALBUMIN 2.7 (L) 01/10/2024 0353   ALBUMIN 3.3 (L) 03/31/2023 0828   AST 574 (H) 01/07/2024 0444   ALT 1,586 (H) 01/07/2024 0444   ALKPHOS 144 (H) 01/07/2024 0444   BILITOT 0.5 01/07/2024 0444   BILITOT <0.2 03/31/2023 0828   GFRNONAA 15 (L) 01/10/2024 0353   GFRNONAA 37 (L) 07/26/2018 1513   GFRAA 54 (L) 05/09/2019 0956   GFRAA 43 (L) 07/26/2018 1513   Lab Results  Component Value Date  CHOL 184 03/31/2023   HDL 60 03/31/2023   LDLCALC 108 (H) 03/31/2023   LDLDIRECT 122 (H) 01/08/2010   TRIG 86 03/31/2023   CHOLHDL 3.1 03/31/2023   Lab Results  Component Value Date   HGBA1C 6.1 (H) 03/31/2023   Lab Results  Component Value Date   VITAMINB12 696 05/06/2021   Lab Results  Component Value Date   TSH 1.330 03/31/2023    Margie Ege, AGNP-C, DNP 02/01/2024, 12:48 PM [facility] [address] [place], [place] [address] [phone]

## 2024-02-04 ENCOUNTER — Telehealth: Payer: Self-pay | Admitting: Neurology

## 2024-02-04 MED ORDER — LEVETIRACETAM 250 MG PO TABS: 250.0000 mg | ORAL_TABLET | Freq: Two times a day (BID) | ORAL | 5 refills | Status: DC

## 2024-02-04 NOTE — Telephone Encounter (Signed)
 Please call the patient, I reviewed with Dr. Teresa Coombs, will still complete EEG, but she can go ahead and reduce dose of Keppra to 250 mg twice daily. Reason for reduction is kidney disease. I sent a new script.  Meds ordered this encounter  Medications   levETIRAcetam (KEPPRA) 250 MG tablet    Sig: Take 1 tablet (250 mg total) by mouth 2 (two) times daily.    Dispense:  60 tablet    Refill:  5    Lowering the dose

## 2024-02-07 DIAGNOSIS — I700 Atherosclerosis of aorta: Secondary | ICD-10-CM | POA: Diagnosis not present

## 2024-02-07 DIAGNOSIS — E1122 Type 2 diabetes mellitus with diabetic chronic kidney disease: Secondary | ICD-10-CM | POA: Diagnosis not present

## 2024-02-07 DIAGNOSIS — K829 Disease of gallbladder, unspecified: Secondary | ICD-10-CM | POA: Diagnosis not present

## 2024-02-07 DIAGNOSIS — J449 Chronic obstructive pulmonary disease, unspecified: Secondary | ICD-10-CM | POA: Diagnosis not present

## 2024-02-07 DIAGNOSIS — G4700 Insomnia, unspecified: Secondary | ICD-10-CM | POA: Diagnosis not present

## 2024-02-07 DIAGNOSIS — J9611 Chronic respiratory failure with hypoxia: Secondary | ICD-10-CM | POA: Diagnosis not present

## 2024-02-07 DIAGNOSIS — I5042 Chronic combined systolic (congestive) and diastolic (congestive) heart failure: Secondary | ICD-10-CM | POA: Diagnosis not present

## 2024-02-07 DIAGNOSIS — K219 Gastro-esophageal reflux disease without esophagitis: Secondary | ICD-10-CM | POA: Diagnosis not present

## 2024-02-07 DIAGNOSIS — G894 Chronic pain syndrome: Secondary | ICD-10-CM | POA: Diagnosis not present

## 2024-02-07 DIAGNOSIS — I480 Paroxysmal atrial fibrillation: Secondary | ICD-10-CM | POA: Diagnosis not present

## 2024-02-07 DIAGNOSIS — E1151 Type 2 diabetes mellitus with diabetic peripheral angiopathy without gangrene: Secondary | ICD-10-CM | POA: Diagnosis not present

## 2024-02-07 DIAGNOSIS — M6281 Muscle weakness (generalized): Secondary | ICD-10-CM | POA: Diagnosis not present

## 2024-02-07 DIAGNOSIS — I130 Hypertensive heart and chronic kidney disease with heart failure and stage 1 through stage 4 chronic kidney disease, or unspecified chronic kidney disease: Secondary | ICD-10-CM | POA: Diagnosis not present

## 2024-02-07 DIAGNOSIS — R296 Repeated falls: Secondary | ICD-10-CM | POA: Diagnosis not present

## 2024-02-07 DIAGNOSIS — K838 Other specified diseases of biliary tract: Secondary | ICD-10-CM | POA: Diagnosis not present

## 2024-02-07 DIAGNOSIS — E46 Unspecified protein-calorie malnutrition: Secondary | ICD-10-CM | POA: Diagnosis not present

## 2024-02-07 DIAGNOSIS — N184 Chronic kidney disease, stage 4 (severe): Secondary | ICD-10-CM | POA: Diagnosis not present

## 2024-02-07 DIAGNOSIS — R269 Unspecified abnormalities of gait and mobility: Secondary | ICD-10-CM | POA: Diagnosis not present

## 2024-02-07 DIAGNOSIS — M48061 Spinal stenosis, lumbar region without neurogenic claudication: Secondary | ICD-10-CM | POA: Diagnosis not present

## 2024-02-07 DIAGNOSIS — E785 Hyperlipidemia, unspecified: Secondary | ICD-10-CM | POA: Diagnosis not present

## 2024-02-07 DIAGNOSIS — D631 Anemia in chronic kidney disease: Secondary | ICD-10-CM | POA: Diagnosis not present

## 2024-02-07 MED ORDER — LEVETIRACETAM 250 MG PO TABS: 250.0000 mg | ORAL_TABLET | Freq: Two times a day (BID) | ORAL | 5 refills | Status: DC

## 2024-02-07 NOTE — Addendum Note (Signed)
 Addended by: Genora Kidd on: 02/07/2024 11:08 AM   Modules accepted: Orders

## 2024-02-07 NOTE — Telephone Encounter (Signed)
 Call to patients son, relay message and he asked that I call [facility] facility and relay the message as well. Called and spoke with med tech Burdette Carolin, she verbalized understanding asked that medication be sent to [facility]. Also confirmed EEG appt for 4/15.

## 2024-02-07 NOTE — Progress Notes (Unsigned)
 GI Office Note    Referring Provider: Heron Lord, FNP Primary Care Physician:  Heron Lord, FNP Primary Gastroenterologist: Urban Garden, MD   Date:  02/08/2024  ID:  [name], DOB Mar 04, 1966, MRN [id number]   Chief Complaint   Chief Complaint  Patient presents with   Follow-up    Hospital follow up.    History of Present Illness  [name] is a 58 y.o. female with a history of alcohol and cocaine abuse, CKD stage IV, OSA not on CPAP, COPD, diabetes, A-fib on Eliquis, HTN, HLD, seizures maintained on Keppra, stroke, C. difficile, prior upper GI bleed secondary to esophagitis and duodenal ulcers presenting today for hospital follow-up of transaminitis.   EGD January 2022: -Grade C esophagitis without bleeding - Normal stomach s/p biopsy - Nonbleeding duodenal ulcers with clean ulcer base - Pathology negative for H. pylori.  Nonspecific reactive gastropathy - Advised PPI twice daily for 3 months then once a day indefinitely, avoid NSAIDs  Seen in patient recently at [facility].  Admitted 01/04/24-01/10/2024.  Presented with generalized weakness over 2 days prior as well as minimal p.o. intake.  Also present with AKI in the setting of stage IV CKD.  Acute hepatitis panel was negative as well as Tylenol and ethanol levels.  Ammonia level at 11.  Platelets were stable although INR increased.  AST 4879, ALT 3761, alk phos 139.  Creatinine elevated at 4.91 on presentation.  RUQ US  with CBD measuring 14 mm.  There was probable tumefactive sludge in the gallbladder without shadowing stones or sonographic evidence of acute cholecystitis.  Fatty liver noted.  Possible medical renal disease of right kidney.  Was due to have dialysis catheter placed day after presentation to ED.  Additional serologies were ordered inpatient.  HSV 1 and 2 IgG and EBV positive which reveals exposure but unable to rule out recent infection.  CMV, ANA, ASMA, AMA, IgG, and HIV all negative/within  normal limits.  Does have a history of alcohol and cocaine use although ethanol level negative on admission.  She was given vitamin K for increasing INR.  Ultrasound liver Doppler was obtained with patent portal and hepatic veins.  Given her LFTs down trended during hospitalization, mostly suspected ischemia or drug-induced liver injury.  Urine drug screen was negative. Advised repeat HFP 2 weeks after discharge.  HFP has yet to be collected.  Today:  No longer with constipation. Has a BM about every 2 days but is normal for her. No straining or abdominal pain. Care aide with her helps provide some context. States she eats well.   Has had some nausea and some vomiting - last episode a few days ago. Had pizza just before that episode - still eating pizza for a few days without putting it in the fridge. Other than this she has not had much issue.   No melena or brbpr.   Has seen dr Carrolyn Clan since hospitalization and he stated that kidney function improved so no need yet to do fistula. Current kidney function > 15 therefore no need to proceed. No confusion, irritability, pruritus, jaundice. Appetite is okay (normal appetite ), no change.    Wt Readings from Last 3 Encounters:  02/08/24 159 lb 8 oz (72.3 kg)  02/01/24 171 lb (77.6 kg)  01/10/24 173 lb 15.1 oz (78.9 kg)    Current Outpatient Medications  Medication Sig Dispense Refill   acetaminophen (TYLENOL) 325 MG tablet Take 2 tablets (650 mg total) by mouth every 6 (six)  hours as needed for headache, fever or mild pain.     amLODipine (NORVASC) 5 MG tablet Take 1 tablet (5 mg total) by mouth daily. 30 tablet 1   ammonium lactate (AMLACTIN) 12 % lotion Apply 1 Application topically as needed for dry skin. 400 g 4   apixaban (ELIQUIS) 5 MG TABS tablet Take 1 tablet (5 mg total) by mouth 2 (two) times daily. 60 tablet 0   ARIPiprazole (ABILIFY) 5 MG tablet Take 1 tablet (5 mg total) by mouth daily. 60 tablet 0   azelastine (OPTIVAR) 0.05 %  ophthalmic solution Place 1 drop into both eyes 2 (two) times daily.     calcitRIOL (ROCALTROL) 0.25 MCG capsule Take 1 capsule (0.25 mcg total) by mouth daily. 30 capsule 0   FLUoxetine (PROZAC) 40 MG capsule Take 2 pills once a day 60 capsule 3   fluticasone (FLONASE) 50 MCG/ACT nasal spray Place 2 sprays into both nostrils daily. 16 g 6   levETIRAcetam (KEPPRA) 250 MG tablet Take 1 tablet (250 mg total) by mouth 2 (two) times daily. 60 tablet 5   loperamide (IMODIUM) 2 MG capsule Take 4 mg by mouth every 4 (four) hours as needed for diarrhea or loose stools.     metoprolol succinate (TOPROL-XL) 25 MG 24 hr tablet Take 1 tablet (25 mg total) by mouth daily. 30 tablet 0   olopatadine (PATADAY) 0.1 % ophthalmic solution Place 1 drop into both eyes 2 (two) times daily.     ondansetron (ZOFRAN-ODT) 4 MG disintegrating tablet Take 4 mg by mouth every 8 (eight) hours as needed for nausea or vomiting.     oxybutynin (DITROPAN-XL) 5 MG 24 hr tablet Take 1 tablet (5 mg total) by mouth at bedtime.     polyethylene glycol (MIRALAX / GLYCOLAX) 17 g packet Take 17 g by mouth daily. 30 each 0   thiamine (VITAMIN B1) 100 MG tablet TAKE (1) TABLET BY MOUTH ONCE DAILY. 30 tablet 0   torsemide (DEMADEX) 20 MG tablet Take 1 tablet (20 mg total) by mouth daily. 30 tablet 0   No current facility-administered medications for this visit.    Past Medical History:  Diagnosis Date   Acute lower UTI 06/04/2018   Acute metabolic encephalopathy 03/11/2021   Acute on chronic respiratory failure with hypoxia (HCC) 07/20/2021   Acute respiratory failure requiring reintubation (HCC) 11/09/2012   Acute respiratory failure with hypoxia (HCC) 12/03/2020   AKI (acute kidney injury) (HCC) 06/04/2018   Alcohol use    Ankle fracture, lateral malleolus, closed 2013   Anxiety    Aortic atherosclerosis (HCC) 03/10/2021   Atherosclerosis of native artery of both legs with gangrene (HCC)    Breast mass, left 2013   CHF  (congestive heart failure) (HCC)    a. EF 20-25% by echo in 05/2016 with cath showing normal cors b. EF 50-55% in 07/2020 c. 01/2021: EF at 55-60% with moderate LVH   Chronic anemia    CKD (chronic kidney disease), stage III (HCC)    Cocaine abuse (HCC)    COPD (chronic obstructive pulmonary disease) (HCC)    Diabetes mellitus, type 2 (HCC)    Diabetic Charcot foot (HCC)    DKA (diabetic ketoacidosis) (HCC) 10/29/2020   Essential hypertension    H/O open hand wound 11/22/2018   History of cardiomyopathy    History of GI bleed 2011   Hyperlipidemia    Lactic acid acidosis 09/11/2014   Metabolic encephalopathy 03/11/2021   Noncompliance  Obesity    Oral candidiasis 03/10/2021   PAF (paroxysmal atrial fibrillation) (HCC)    Panic attacks    PAT (paroxysmal atrial tachycardia) (HCC)    Previously on Amiodarone   Pneumonia due to COVID-19 virus 12/03/2020   Seizures (HCC) 10/01/2020   Sepsis due to skin infection (HCC) 04/01/2021   Shock (HCC) 10/29/2020   Sleep apnea    Not on CPAP   Stroke (HCC) 2018   Thrombocytosis 08/02/2017   Tobacco abuse    UGI bleed    Urinary incontinence     Past Surgical History:  Procedure Laterality Date   ANGIOPLASTY ILLIAC ARTERY Right 04/23/2021   Procedure: ANGIOPLASTY AND STENT SUPERFICIAL FEMORAL ARTERY;  Surgeon: Leonie Douglas, MD;  Location: [facility] OR;  Service: Vascular;  Laterality: Right;   AORTOGRAM  04/23/2021   Procedure: AORTOGRAM;  Surgeon: Leonie Douglas, MD;  Location: [facility] OR;  Service: Vascular;;   AORTOGRAM Left 04/30/2021   Procedure: left lower extremity angiogram with second order cannulation;  Surgeon: Leonie Douglas, MD;  Location: [facility] OR;  Service: Vascular;  Laterality: Left;   BIOPSY  11/12/2020   Procedure: BIOPSY;  Surgeon: Dolores Frame, MD;  Location: [facility] SUITE;  Service: Gastroenterology;;   BREAST BIOPSY     CARDIAC CATHETERIZATION N/A 07/28/2016   Procedure: Left Heart Cath and Coronary  Angiography;  Surgeon: Corky Crafts, MD;  Location: [facility] INVASIVE CV LAB;  Service: Cardiovascular;  Laterality: N/A;   COLONOSCOPY N/A 05/10/2019   Procedure: COLONOSCOPY;  Surgeon: West Bali, MD;  Location: [facility];  Service: Endoscopy;  Laterality: N/A;  Phenergan 12.5 mg IV in pre-op   DILATION AND CURETTAGE OF UTERUS     ESOPHAGOGASTRODUODENOSCOPY (EGD) WITH PROPOFOL N/A 11/12/2020   Procedure: ESOPHAGOGASTRODUODENOSCOPY (EGD) WITH PROPOFOL;  Surgeon: Dolores Frame, MD;  [facility]: [facility];  Service: Gastroenterology;  Laterality: N/A;   I & D EXTREMITY Bilateral 09/22/2017   Procedure: BILATERAL DEBRIDEMENT LEG/FOOT ULCERS, APPLY VERAFLO WOUND VAC;  Surgeon: Nadara Mustard, MD;  Location: [facility] OR;  Service: Orthopedics;  Laterality: Bilateral;   I & D EXTREMITY Right 10/11/2018   Procedure: IRRIGATION AND DEBRIDEMENT RIGHT HAND;  Surgeon: Dominica Severin, MD;  Location: [facility] OR;  Service: Orthopedics;  Laterality: Right;   I & D EXTREMITY Right 10/13/2018   Procedure: REPEAT IRRIGATION AND DEBRIDEMENT RIGHT HAND;  Surgeon: Dominica Severin, MD;  Location: [facility] OR;  Service: Orthopedics;  Laterality: Right;   I & D EXTREMITY Right 11/22/2018   Procedure: IRRIGATION AND DEBRIDEMENT AND PINNING RIGHT HAND;  Surgeon: Dominica Severin, MD;  Location: [facility] OR;  Service: Orthopedics;  Laterality: Right;   IR RADIOLOGIST EVAL & MGMT  07/05/2018   LOWER EXTREMITY ANGIOGRAM Bilateral 04/23/2021   Procedure: RIGHT  AND LEFT LOWER EXTREMITY ANGIOGRAM;  Surgeon: Leonie Douglas, MD;  Location: [facility] OR;  Service: Vascular;  Laterality: Bilateral;   POLYPECTOMY  05/10/2019   Procedure: POLYPECTOMY;  Surgeon: West Bali, MD;  Location: [facility];  Service: Endoscopy;;   SKIN SPLIT GRAFT Bilateral 09/28/2017   Procedure: BILATERAL SPLIT THICKNESS SKIN GRAFT LEGS/FEET AND APPLY VAC;  Surgeon: Nadara Mustard, MD;  Location: [facility] OR;  Service: Orthopedics;  Laterality: Bilateral;   SKIN  SPLIT GRAFT Right 11/22/2018   Procedure: SKIN GRAFT SPLIT THICKNESS;  Surgeon: Dominica Severin, MD;  Location: [facility] OR;  Service: Orthopedics;  Laterality: Right;    Family History  Problem Relation Age of Onset  Hypertension Mother    Heart attack Mother    Hypertension Father        CABG    Hypertension Sister    Hypertension Brother    Hypertension Sister    Cancer Sister        breast    Arthritis Other    Cancer Other    Diabetes Other    Asthma Other    Hypertension Daughter    Hypertension Son     Allergies as of 02/08/2024 - Review Complete 02/08/2024  Allergen Reaction Noted   Sodium hypochlorite Anaphylaxis 07/29/2021    Social History   Socioeconomic History   Marital status: Widowed    Spouse name: Not on file   Number of children: 2   Years of education: 9   Highest education level: 9th grade  Occupational History   Occupation: Disabled  Tobacco Use   Smoking status: Former    Current packs/day: 0.00    Average packs/day: 0.3 packs/day for 20.0 years (5.0 ttl pk-yrs)    Types: Cigarettes    Start date: 06/03/1997    Quit date: 06/03/2017    Years since quitting: 6.6    Passive exposure: Never   Smokeless tobacco: Never  Vaping Use   Vaping status: Never Used  Substance and Sexual Activity   Alcohol use: Not Currently    Alcohol/week: 0.0 standard drinks of alcohol    Comment: Quit in 2017   Drug use: Not Currently    Frequency: 3.0 times per week    Types: Marijuana, Cocaine    Comment: none since August 2018   Sexual activity: Not on file  Other Topics Concern   Not on file  Social History Narrative   Right handed   Drinks 1-2 cups caffeine daily   Social Drivers of Health   Financial Resource Strain: Medium Risk (10/15/2020)   Overall Financial Resource Strain (CARDIA)    Difficulty of Paying Living Expenses: Somewhat hard  Food Insecurity: No Food Insecurity (01/04/2024)   Hunger Vital Sign    Worried About Running Out of Food in the  Last Year: Never true    Ran Out of Food in the Last Year: Never true  Transportation Needs: No Transportation Needs (01/04/2024)   PRAPARE - Administrator, Civil Service (Medical): No    Lack of Transportation (Non-Medical): No  Physical Activity: Insufficiently Active (10/15/2020)   Exercise Vital Sign    Days of Exercise per Week: 7 days    Minutes of Exercise per Session: 20 min  Stress: Stress Concern Present (10/15/2020)   [employer] of Occupational Health - Occupational Stress Questionnaire    Feeling of Stress : To some extent  Social Connections: Moderately Isolated (10/15/2020)   Social Connection and Isolation Panel [NHANES]    Frequency of Communication with Friends and Family: More than three times a week    Frequency of Social Gatherings with Friends and Family: More than three times a week    Attends Religious Services: More than 4 times per year    Active Member of [employer] or Organizations: No    Attends Banker Meetings: Never    Marital Status: Widowed     Review of Systems   Gen: Denies fever, chills, anorexia. Denies fatigue, weakness, weight loss.  CV: Denies chest pain, palpitations, syncope, peripheral edema, and claudication. Resp: Denies dyspnea at rest, cough, wheezing, coughing up blood, and pleurisy. GI: See HPI Derm: Denies rash, itching, dry skin Psych:  Denies depression, anxiety, memory loss, confusion. No homicidal or suicidal ideation.  Heme: Denies bruising, bleeding, and enlarged lymph nodes.  Physical Exam   BP 118/86 (BP Location: Right Arm, Patient Position: Sitting, Cuff Size: Large)   Pulse 76   Temp 98.6 F (37 C) (Temporal)   Ht 5\' 1"  (1.549 m)   Wt 159 lb 8 oz (72.3 kg)   LMP 05/19/2016   BMI 30.14 kg/m   General:   Alert and oriented. No distress noted. Pleasant and cooperative.  Head:  Normocephalic and atraumatic. Eyes:  Conjuctiva clear without scleral icterus. Mouth:  Oral mucosa pink and  moist. Good dentition. No lesions. Lungs:  Clear to auscultation bilaterally. No wheezes, rales, or rhonchi. No distress.  Heart:  S1, S2 present without murmurs appreciated.  Abdomen:  +BS, soft, non-distended.  Mild TTP to RLQ.  No rebound or guarding. No HSM or masses noted. Rectal: Deferred Msk:  Symmetrical without gross deformities. Normal posture.  Weakness generalized. Extremities:  Without edema. Neurologic:  Alert and  oriented x4 Psych:  Alert and cooperative. Normal mood and affect.  Assessment  [name] is a 58 y.o. female with a history of alcohol and cocaine abuse, CKD stage IV, OSA not on CPAP, COPD, diabetes, A-fib on Eliquis, HTN, HLD, seizures maintained on Keppra, stroke, C. difficile, prior upper GI bleed secondary to esophagitis and duodenal ulcers presenting today for hospital follow-up.  Transaminitis, elevated alk phos: - RUQ [name] in March with tumefactive sludge in the gallbladder without shadowing stones or evidence of acute cholecystitis.  Fatty liver present - Ultrasound liver Doppler with patent portal and hepatic veins.  No thrombosis. - LFTs were markedly elevated with AST 4879, ALT 3761, alk phos 139.  INR peaked at 2 - LFTs improved to AST 574, ALT 1586, alk phos 144 prior to discharge - Workup with negative AMA, ASMA, ANA, IgG.  Ferritin significantly elevated however with normal iron saturation.  Ferritin elevation likely reactive. - Remains without any signs of HE, jaundice, pruritus - Will attempt to get repeat labs and will also check INR, GGT given elevated alk phos, FibroSure, and elf to assess degree of fibrosis given evidence of hepatic steatosis on imaging.  History of upper GI bleed with esophagitis and peptic ulcer disease: - History of duodenal ulcers and esophagitis on EGD January 2022 - Currently not maintained on any NSAID's - Denies any significant reflux episodes, recent nausea/vomiting likely secondary to potential food poisoning  versus viral illness. - For now can remain off PPI.  Stable anemia likely secondary more so to renal disease  Constipation: - Reported some mild constipation while inpatient -States given stool softeners at facility initially on discharge, no longer taking -Had brief bout of nausea, vomiting, and diarrhea however likely secondary to possible GI virus versus food poisoning -Gave order to use stool softener as needed  PLAN   CMP, INR, GGT, FibroSure, ELF Notify if signs of confusion  Low fat diet Stool softener as needed Follow up in 3 months   Brooke Bonito, MSN, FNP-BC, AGACNP-BC [facility]  I have reviewed the note and agree with the APP's assessment as described in this progress note  Katrinka Blazing, MD Gastroenterology and Hepatology [facility]

## 2024-02-08 ENCOUNTER — Ambulatory Visit (INDEPENDENT_AMBULATORY_CARE_PROVIDER_SITE_OTHER): Admitting: Neurology

## 2024-02-08 ENCOUNTER — Encounter: Payer: Self-pay | Admitting: Gastroenterology

## 2024-02-08 ENCOUNTER — Ambulatory Visit (INDEPENDENT_AMBULATORY_CARE_PROVIDER_SITE_OTHER): Admitting: Gastroenterology

## 2024-02-08 VITALS — BP 118/86 | HR 76 | Temp 98.6°F | Ht 61.0 in | Wt 159.5 lb

## 2024-02-08 DIAGNOSIS — Z8719 Personal history of other diseases of the digestive system: Secondary | ICD-10-CM | POA: Diagnosis not present

## 2024-02-08 DIAGNOSIS — Z8711 Personal history of peptic ulcer disease: Secondary | ICD-10-CM

## 2024-02-08 DIAGNOSIS — R7989 Other specified abnormal findings of blood chemistry: Secondary | ICD-10-CM

## 2024-02-08 DIAGNOSIS — R748 Abnormal levels of other serum enzymes: Secondary | ICD-10-CM

## 2024-02-08 DIAGNOSIS — R7401 Elevation of levels of liver transaminase levels: Secondary | ICD-10-CM

## 2024-02-08 DIAGNOSIS — K5900 Constipation, unspecified: Secondary | ICD-10-CM | POA: Diagnosis not present

## 2024-02-08 DIAGNOSIS — D649 Anemia, unspecified: Secondary | ICD-10-CM

## 2024-02-08 DIAGNOSIS — G40909 Epilepsy, unspecified, not intractable, without status epilepticus: Secondary | ICD-10-CM | POA: Diagnosis not present

## 2024-02-08 DIAGNOSIS — K760 Fatty (change of) liver, not elsewhere classified: Secondary | ICD-10-CM | POA: Diagnosis not present

## 2024-02-08 NOTE — Patient Instructions (Signed)
 Monitor for signs of confusion, hallucinations, jaundice.   I am ordering additional labs today.  Please have these done at the facility.  If what ever reason phlebotomy is not able to get these labs at this facility, please go to [facility].  Follow low-fat diet to the best of your ability.  As you know good control of your blood sugars and blood pressure are vital and liver health as well.  If you begin to have any worsening constipation I would recommend a stool softener.  We can place this on your list as needed.  We will follow-up in 3 months, sooner if needed.  Further recommendations to follow after receiving blood work.  It was a pleasure to see you today. I want to create trusting relationships with patients. If you receive a survey regarding your visit,  I greatly appreciate you taking time to fill this out on paper or through your MyChart. I value your feedback.  Julian Obey, MSN, FNP-BC, AGACNP-BC [facility]

## 2024-02-09 ENCOUNTER — Ambulatory Visit: Admitting: Gastroenterology

## 2024-02-09 NOTE — Procedures (Signed)
    History:  58 year old woman with seizure   EEG classification: Awake and drowsy  Duration: 27 minutes   Technical aspects: This EEG study was done with scalp electrodes positioned according to the 10-20 International system of electrode placement. Electrical activity was reviewed with band pass filter of 1-70Hz , sensitivity of 7 uV/mm, display speed of 15mm/sec with a 60Hz  notched filter applied as appropriate. EEG data were recorded continuously and digitally stored.   Description of the recording: The background rhythms of this recording consists of a fairly well modulated medium amplitude alpha rhythm of 9 Hz that is reactive to eye opening and closure. Present in the anterior head region is a 15-20 Hz beta activity. Photic stimulation was performed, did not show any abnormalities. Hyperventilation was not performed. Drowsiness was manifested by background fragmentation. No abnormal epileptiform discharges seen during this recording. There was no focal slowing. There were no electrographic seizure identified.   Abnormality: None   Impression: This is a normal awake and drowsy EEG. No evidence of interictal epileptiform discharges. Normal EEGs, however, do not rule out epilepsy.    Zareen Jamison, MD [facility]

## 2024-02-10 ENCOUNTER — Ambulatory Visit (HOSPITAL_BASED_OUTPATIENT_CLINIC_OR_DEPARTMENT_OTHER): Payer: 59 | Attending: Primary Care | Admitting: Pulmonary Disease

## 2024-02-10 VITALS — Ht 61.0 in | Wt 158.0 lb

## 2024-02-10 DIAGNOSIS — G4733 Obstructive sleep apnea (adult) (pediatric): Secondary | ICD-10-CM | POA: Diagnosis not present

## 2024-02-10 DIAGNOSIS — I480 Paroxysmal atrial fibrillation: Secondary | ICD-10-CM | POA: Diagnosis not present

## 2024-02-10 DIAGNOSIS — I5042 Chronic combined systolic (congestive) and diastolic (congestive) heart failure: Secondary | ICD-10-CM

## 2024-02-10 DIAGNOSIS — R0683 Snoring: Secondary | ICD-10-CM

## 2024-02-10 DIAGNOSIS — Z8669 Personal history of other diseases of the nervous system and sense organs: Secondary | ICD-10-CM

## 2024-02-14 ENCOUNTER — Telehealth: Payer: Self-pay

## 2024-02-14 NOTE — Telephone Encounter (Signed)
 Multiple attempts to reach representative at [facility].  No answer.  Letter sent.

## 2024-02-15 ENCOUNTER — Telehealth: Payer: Self-pay | Admitting: Neurology

## 2024-02-15 DIAGNOSIS — I700 Atherosclerosis of aorta: Secondary | ICD-10-CM | POA: Diagnosis not present

## 2024-02-15 DIAGNOSIS — G4700 Insomnia, unspecified: Secondary | ICD-10-CM | POA: Diagnosis not present

## 2024-02-15 DIAGNOSIS — J449 Chronic obstructive pulmonary disease, unspecified: Secondary | ICD-10-CM | POA: Diagnosis not present

## 2024-02-15 DIAGNOSIS — E1151 Type 2 diabetes mellitus with diabetic peripheral angiopathy without gangrene: Secondary | ICD-10-CM | POA: Diagnosis not present

## 2024-02-15 DIAGNOSIS — E785 Hyperlipidemia, unspecified: Secondary | ICD-10-CM | POA: Diagnosis not present

## 2024-02-15 DIAGNOSIS — J9611 Chronic respiratory failure with hypoxia: Secondary | ICD-10-CM | POA: Diagnosis not present

## 2024-02-15 DIAGNOSIS — E46 Unspecified protein-calorie malnutrition: Secondary | ICD-10-CM | POA: Diagnosis not present

## 2024-02-15 DIAGNOSIS — K829 Disease of gallbladder, unspecified: Secondary | ICD-10-CM | POA: Diagnosis not present

## 2024-02-15 DIAGNOSIS — N184 Chronic kidney disease, stage 4 (severe): Secondary | ICD-10-CM | POA: Diagnosis not present

## 2024-02-15 DIAGNOSIS — I5042 Chronic combined systolic (congestive) and diastolic (congestive) heart failure: Secondary | ICD-10-CM | POA: Diagnosis not present

## 2024-02-15 DIAGNOSIS — R296 Repeated falls: Secondary | ICD-10-CM | POA: Diagnosis not present

## 2024-02-15 DIAGNOSIS — I480 Paroxysmal atrial fibrillation: Secondary | ICD-10-CM | POA: Diagnosis not present

## 2024-02-15 DIAGNOSIS — I130 Hypertensive heart and chronic kidney disease with heart failure and stage 1 through stage 4 chronic kidney disease, or unspecified chronic kidney disease: Secondary | ICD-10-CM | POA: Diagnosis not present

## 2024-02-15 DIAGNOSIS — M6281 Muscle weakness (generalized): Secondary | ICD-10-CM | POA: Diagnosis not present

## 2024-02-15 DIAGNOSIS — K219 Gastro-esophageal reflux disease without esophagitis: Secondary | ICD-10-CM | POA: Diagnosis not present

## 2024-02-15 DIAGNOSIS — D631 Anemia in chronic kidney disease: Secondary | ICD-10-CM | POA: Diagnosis not present

## 2024-02-15 DIAGNOSIS — G894 Chronic pain syndrome: Secondary | ICD-10-CM | POA: Diagnosis not present

## 2024-02-15 DIAGNOSIS — E1122 Type 2 diabetes mellitus with diabetic chronic kidney disease: Secondary | ICD-10-CM | POA: Diagnosis not present

## 2024-02-15 DIAGNOSIS — R269 Unspecified abnormalities of gait and mobility: Secondary | ICD-10-CM | POA: Diagnosis not present

## 2024-02-15 DIAGNOSIS — K838 Other specified diseases of biliary tract: Secondary | ICD-10-CM | POA: Diagnosis not present

## 2024-02-15 DIAGNOSIS — M48061 Spinal stenosis, lumbar region without neurogenic claudication: Secondary | ICD-10-CM | POA: Diagnosis not present

## 2024-02-15 NOTE — Progress Notes (Signed)
 If it was a single event, I would stop the Keppra .

## 2024-02-15 NOTE — Addendum Note (Signed)
 Addended by: Genora Kidd on: 02/15/2024 03:11 PM   Modules accepted: Orders

## 2024-02-15 NOTE — Telephone Encounter (Signed)
 Call to Burdette Carolin, RN at at patients ALF. Advised per Isa Manuel and Dr. Samara Crest recommendations to to stop Keppra . Faxed over written order and called [facility] pharmacy and had Keppra  discontinued.

## 2024-02-15 NOTE — Telephone Encounter (Signed)
 Return call to beth, advised that I had already spoke to Burdette Carolin, Charity fundraiser at facility and faxed written order to stop keppra .

## 2024-02-15 NOTE — Telephone Encounter (Signed)
 Beth at [facility] [phone] is asking for a call back to confirm the medication that will be stopped.  Beth also is stating the nursing home where pt lives needs notification sent to them as well re: the medication that needs to be discontinued.

## 2024-02-15 NOTE — Telephone Encounter (Addendum)
 Please call the patient, let her know that EEG was normal. We discussed stopping the Keppra  since seizure event occurred during time of severe illness. She may stop the Keppra  and monitor for seizure, especially if she is confident she will stay in good health with good control of vascular risk factors.  Reviewed with Dr. Samara Crest.   Impression: This is a normal awake and drowsy EEG. No evidence of interictal epileptiform discharges. Normal EEGs, however, do not rule out epilepsy.

## 2024-02-18 DIAGNOSIS — G4733 Obstructive sleep apnea (adult) (pediatric): Secondary | ICD-10-CM | POA: Diagnosis not present

## 2024-02-18 NOTE — Procedures (Signed)
 Split Night Interpretation  Patient Name:  [name], [name] Date:  02/10/2024 Referring Physician:  Antonio Baumgarten, Np  Indications for Polysomnography The patient is a 58 year old - who is 5\' 1"  and weighs 158.0 lbs.  Her BMI equals 30.2.  A diagnostic polysomnogram was performed to evaluate for -.  After 129.0 minutes of sleep time the patient exhibited sufficient respiratory events qualifying him/her for a CPAP trial which was then initiated.    Polysomnogram Data A full night polysomnogram was performed recording the standard physiologic parameters including EEG, EOG, EMG, EKG, nasal and oral airflow.  Respiratory parameters of chest and abdominal movements are recorded with Peizo-Crystal motion transducers.  Oxygen  saturation was recorded by pulse oximetry.    Sleep Architecture The total recording time of the diagnostic portion of the study was 252.1 minutes.  The total sleep time was 129.0 minutes.  During the diagnostic portion of the study, the patient spent 48.8% of total sleep time in Stage N1, 48.1% in Stage N2, 0.0% in Stages N3, and 3.1% in REM.   Sleep latency was 0.1 minutes.  REM latency was 63.5 minutes.  Sleep Efficiency was 51.2%.  Wake after Sleep Onset time was 123.0 minutes.   At 01:54:43 AM the patient was placed on PAP treatment and was titrated at pressures ranging from 8* cm/H20 up to 16* cm/H20 .The total recording time of the treatment portion of the study was 181.3 minutes.  The total sleep time was 154.5 minutes.  During the treatment portion of the study, the patient spent 9.4% of total sleep time in Stage N1, 47.2% in Stage N2, 16.8% in Stages N3, and 26.5% in REM.   Sleep latency was 0.0 minutes.  REM latency was 46.0 minutes.  Sleep Efficiency was 85.2%.  Wake after Sleep Onset time was 26.5 minutes.  Respiratory Events During the diagnostic portion of the study, the polysomnogram revealed a presence of 10 obstructive, - central, and - mixed apneas  resulting in an Apnea index of 4.7 events per hour.  There were 90 hypopneas (>=3% desaturation and/or arousal) resulting in an Apnea\Hypopnea Index (AHI >=3% desaturation and/or arousal) of 46.5 events per hour.  There were 44 hypopneas (>=4% desaturation) resulting in an Apnea\Hypopnea Index (AHI >=4% desaturation) of 25.1 events per hour.  There were 4 Respiratory Effort Related Arousals resulting in a RERA index of 1.9 events per hour. The Respiratory Disturbance Index is 48.4 events per hour.  The snore index was - events per hour.  Mean oxygen  saturation was 96.5%.  The lowest oxygen  saturation during sleep was 90.0%.  Time spent <=88% oxygen  saturation was - minutes (-).  End Tidal CO2 during sleep ranged from - to - mmHg. End Tidal CO2 was greater than 50 mmHg for - minutes and greater than 55 mmHg for - minutes.  During the treatment portion of the study, the polysomnogram revealed a presence of 1 obstructive, 1 central, and - mixed apneas resulting in an Apnea index of 0.8 events per hour.  There were 20 hypopneas (>=3% desaturation and/or arousal) resulting in an Apnea\Hypopnea Index (AHI >=3% desaturation and/or arousal) of 8.5 events per hour.  There were 4 hypopneas (>=4% desaturation) resulting in an Apnea\Hypopnea Index (AHI >=4% desaturation) of 2.3 events per hour.  There were 5 Respiratory Effort Related Arousals resulting in a RERA index of 1.9 events per hour. The Respiratory Disturbance Index is 10.5 events per hour.  The snore index was - events per hour.  Mean oxygen  saturation  was 97.7%.  The lowest oxygen  saturation during sleep was 92.0%.  Time spent <=88% oxygen  saturation was - minutes (-).  Limb Activity During the diagnostic portion of the study, there were - limb movements recorded.  Of this total, - were classified as PLMs.  Of the PLMs, - were associated with arousals.  The Limb Movement index was - per hour while the PLM index was - per hour.  During the treatment portion of  the study, there were - limb movements recorded.  Of this total, - were classified as PLMs.  Of the PLMs, - were associated with arousals.  The Limb Movement index was - per hour while the PLM index was - per hour.  Cardiac Summary During the diagnostic portion of the study, the average pulse rate was 74.9 bpm.  The minimum pulse rate was 39.0 bpm while the maximum pulse rate was 107.0 bpm.  During the treatment portion of the study, the average pulse rate was 74.5 bpm.  The minimum pulse rate was 39.0 bpm while the maximum pulse rate was 109.0 bpm.   Diagnosis: Severe OSA corrected by CPAP 16 cm  Recommendations: CPAP 16 cm with medium full face mask Alternatively, autoCPAP 10-16 cm can be used   This study was personally reviewed and electronically signed by: Celene Coins, Md Accredited Board Certified in Sleep Medicine

## 2024-02-23 DIAGNOSIS — K219 Gastro-esophageal reflux disease without esophagitis: Secondary | ICD-10-CM | POA: Diagnosis not present

## 2024-02-23 DIAGNOSIS — J449 Chronic obstructive pulmonary disease, unspecified: Secondary | ICD-10-CM | POA: Diagnosis not present

## 2024-02-23 DIAGNOSIS — K838 Other specified diseases of biliary tract: Secondary | ICD-10-CM | POA: Diagnosis not present

## 2024-02-23 DIAGNOSIS — N184 Chronic kidney disease, stage 4 (severe): Secondary | ICD-10-CM | POA: Diagnosis not present

## 2024-02-23 DIAGNOSIS — E1151 Type 2 diabetes mellitus with diabetic peripheral angiopathy without gangrene: Secondary | ICD-10-CM | POA: Diagnosis not present

## 2024-02-23 DIAGNOSIS — I5042 Chronic combined systolic (congestive) and diastolic (congestive) heart failure: Secondary | ICD-10-CM | POA: Diagnosis not present

## 2024-02-23 DIAGNOSIS — E1122 Type 2 diabetes mellitus with diabetic chronic kidney disease: Secondary | ICD-10-CM | POA: Diagnosis not present

## 2024-02-23 DIAGNOSIS — R269 Unspecified abnormalities of gait and mobility: Secondary | ICD-10-CM | POA: Diagnosis not present

## 2024-02-23 DIAGNOSIS — E785 Hyperlipidemia, unspecified: Secondary | ICD-10-CM | POA: Diagnosis not present

## 2024-02-23 DIAGNOSIS — J9611 Chronic respiratory failure with hypoxia: Secondary | ICD-10-CM | POA: Diagnosis not present

## 2024-02-23 DIAGNOSIS — M6281 Muscle weakness (generalized): Secondary | ICD-10-CM | POA: Diagnosis not present

## 2024-02-23 DIAGNOSIS — M48061 Spinal stenosis, lumbar region without neurogenic claudication: Secondary | ICD-10-CM | POA: Diagnosis not present

## 2024-02-23 DIAGNOSIS — I700 Atherosclerosis of aorta: Secondary | ICD-10-CM | POA: Diagnosis not present

## 2024-02-23 DIAGNOSIS — E46 Unspecified protein-calorie malnutrition: Secondary | ICD-10-CM | POA: Diagnosis not present

## 2024-02-23 DIAGNOSIS — G4700 Insomnia, unspecified: Secondary | ICD-10-CM | POA: Diagnosis not present

## 2024-02-23 DIAGNOSIS — K829 Disease of gallbladder, unspecified: Secondary | ICD-10-CM | POA: Diagnosis not present

## 2024-02-23 DIAGNOSIS — R296 Repeated falls: Secondary | ICD-10-CM | POA: Diagnosis not present

## 2024-02-23 DIAGNOSIS — I480 Paroxysmal atrial fibrillation: Secondary | ICD-10-CM | POA: Diagnosis not present

## 2024-02-23 DIAGNOSIS — G894 Chronic pain syndrome: Secondary | ICD-10-CM | POA: Diagnosis not present

## 2024-02-23 DIAGNOSIS — I130 Hypertensive heart and chronic kidney disease with heart failure and stage 1 through stage 4 chronic kidney disease, or unspecified chronic kidney disease: Secondary | ICD-10-CM | POA: Diagnosis not present

## 2024-02-23 DIAGNOSIS — D631 Anemia in chronic kidney disease: Secondary | ICD-10-CM | POA: Diagnosis not present

## 2024-02-24 DIAGNOSIS — K219 Gastro-esophageal reflux disease without esophagitis: Secondary | ICD-10-CM | POA: Diagnosis not present

## 2024-02-24 DIAGNOSIS — E785 Hyperlipidemia, unspecified: Secondary | ICD-10-CM | POA: Diagnosis not present

## 2024-02-24 DIAGNOSIS — K5900 Constipation, unspecified: Secondary | ICD-10-CM | POA: Diagnosis not present

## 2024-02-24 DIAGNOSIS — G4730 Sleep apnea, unspecified: Secondary | ICD-10-CM | POA: Diagnosis not present

## 2024-02-24 DIAGNOSIS — R7401 Elevation of levels of liver transaminase levels: Secondary | ICD-10-CM | POA: Diagnosis not present

## 2024-02-24 DIAGNOSIS — I10 Essential (primary) hypertension: Secondary | ICD-10-CM | POA: Diagnosis not present

## 2024-02-24 DIAGNOSIS — I480 Paroxysmal atrial fibrillation: Secondary | ICD-10-CM | POA: Diagnosis not present

## 2024-02-24 DIAGNOSIS — J449 Chronic obstructive pulmonary disease, unspecified: Secondary | ICD-10-CM | POA: Diagnosis not present

## 2024-03-03 DIAGNOSIS — G894 Chronic pain syndrome: Secondary | ICD-10-CM | POA: Diagnosis not present

## 2024-03-03 DIAGNOSIS — J9611 Chronic respiratory failure with hypoxia: Secondary | ICD-10-CM | POA: Diagnosis not present

## 2024-03-03 DIAGNOSIS — I700 Atherosclerosis of aorta: Secondary | ICD-10-CM | POA: Diagnosis not present

## 2024-03-03 DIAGNOSIS — E46 Unspecified protein-calorie malnutrition: Secondary | ICD-10-CM | POA: Diagnosis not present

## 2024-03-03 DIAGNOSIS — K219 Gastro-esophageal reflux disease without esophagitis: Secondary | ICD-10-CM | POA: Diagnosis not present

## 2024-03-03 DIAGNOSIS — K829 Disease of gallbladder, unspecified: Secondary | ICD-10-CM | POA: Diagnosis not present

## 2024-03-03 DIAGNOSIS — G4700 Insomnia, unspecified: Secondary | ICD-10-CM | POA: Diagnosis not present

## 2024-03-03 DIAGNOSIS — N184 Chronic kidney disease, stage 4 (severe): Secondary | ICD-10-CM | POA: Diagnosis not present

## 2024-03-03 DIAGNOSIS — M48061 Spinal stenosis, lumbar region without neurogenic claudication: Secondary | ICD-10-CM | POA: Diagnosis not present

## 2024-03-03 DIAGNOSIS — D631 Anemia in chronic kidney disease: Secondary | ICD-10-CM | POA: Diagnosis not present

## 2024-03-03 DIAGNOSIS — K838 Other specified diseases of biliary tract: Secondary | ICD-10-CM | POA: Diagnosis not present

## 2024-03-03 DIAGNOSIS — R296 Repeated falls: Secondary | ICD-10-CM | POA: Diagnosis not present

## 2024-03-03 DIAGNOSIS — I5042 Chronic combined systolic (congestive) and diastolic (congestive) heart failure: Secondary | ICD-10-CM | POA: Diagnosis not present

## 2024-03-03 DIAGNOSIS — M6281 Muscle weakness (generalized): Secondary | ICD-10-CM | POA: Diagnosis not present

## 2024-03-03 DIAGNOSIS — E785 Hyperlipidemia, unspecified: Secondary | ICD-10-CM | POA: Diagnosis not present

## 2024-03-03 DIAGNOSIS — R269 Unspecified abnormalities of gait and mobility: Secondary | ICD-10-CM | POA: Diagnosis not present

## 2024-03-03 DIAGNOSIS — J449 Chronic obstructive pulmonary disease, unspecified: Secondary | ICD-10-CM | POA: Diagnosis not present

## 2024-03-03 DIAGNOSIS — E1122 Type 2 diabetes mellitus with diabetic chronic kidney disease: Secondary | ICD-10-CM | POA: Diagnosis not present

## 2024-03-03 DIAGNOSIS — I480 Paroxysmal atrial fibrillation: Secondary | ICD-10-CM | POA: Diagnosis not present

## 2024-03-03 DIAGNOSIS — I130 Hypertensive heart and chronic kidney disease with heart failure and stage 1 through stage 4 chronic kidney disease, or unspecified chronic kidney disease: Secondary | ICD-10-CM | POA: Diagnosis not present

## 2024-03-03 DIAGNOSIS — E1151 Type 2 diabetes mellitus with diabetic peripheral angiopathy without gangrene: Secondary | ICD-10-CM | POA: Diagnosis not present

## 2024-03-09 DIAGNOSIS — R519 Headache, unspecified: Secondary | ICD-10-CM | POA: Diagnosis not present

## 2024-03-09 DIAGNOSIS — J302 Other seasonal allergic rhinitis: Secondary | ICD-10-CM | POA: Diagnosis not present

## 2024-03-09 DIAGNOSIS — K069 Disorder of gingiva and edentulous alveolar ridge, unspecified: Secondary | ICD-10-CM | POA: Diagnosis not present

## 2024-03-09 DIAGNOSIS — R296 Repeated falls: Secondary | ICD-10-CM | POA: Diagnosis not present

## 2024-03-09 DIAGNOSIS — R531 Weakness: Secondary | ICD-10-CM | POA: Diagnosis not present

## 2024-03-09 DIAGNOSIS — H04209 Unspecified epiphora, unspecified lacrimal gland: Secondary | ICD-10-CM | POA: Diagnosis not present

## 2024-03-09 DIAGNOSIS — M62521 Muscle wasting and atrophy, not elsewhere classified, right upper arm: Secondary | ICD-10-CM | POA: Diagnosis not present

## 2024-03-09 DIAGNOSIS — Z0000 Encounter for general adult medical examination without abnormal findings: Secondary | ICD-10-CM | POA: Diagnosis not present

## 2024-03-09 DIAGNOSIS — G894 Chronic pain syndrome: Secondary | ICD-10-CM | POA: Diagnosis not present

## 2024-03-10 DIAGNOSIS — K838 Other specified diseases of biliary tract: Secondary | ICD-10-CM | POA: Diagnosis not present

## 2024-03-10 DIAGNOSIS — R296 Repeated falls: Secondary | ICD-10-CM | POA: Diagnosis not present

## 2024-03-10 DIAGNOSIS — R269 Unspecified abnormalities of gait and mobility: Secondary | ICD-10-CM | POA: Diagnosis not present

## 2024-03-10 DIAGNOSIS — K829 Disease of gallbladder, unspecified: Secondary | ICD-10-CM | POA: Diagnosis not present

## 2024-03-10 DIAGNOSIS — G894 Chronic pain syndrome: Secondary | ICD-10-CM | POA: Diagnosis not present

## 2024-03-10 DIAGNOSIS — G4700 Insomnia, unspecified: Secondary | ICD-10-CM | POA: Diagnosis not present

## 2024-03-10 DIAGNOSIS — E46 Unspecified protein-calorie malnutrition: Secondary | ICD-10-CM | POA: Diagnosis not present

## 2024-03-10 DIAGNOSIS — N184 Chronic kidney disease, stage 4 (severe): Secondary | ICD-10-CM | POA: Diagnosis not present

## 2024-03-10 DIAGNOSIS — E1151 Type 2 diabetes mellitus with diabetic peripheral angiopathy without gangrene: Secondary | ICD-10-CM | POA: Diagnosis not present

## 2024-03-10 DIAGNOSIS — I480 Paroxysmal atrial fibrillation: Secondary | ICD-10-CM | POA: Diagnosis not present

## 2024-03-10 DIAGNOSIS — I700 Atherosclerosis of aorta: Secondary | ICD-10-CM | POA: Diagnosis not present

## 2024-03-10 DIAGNOSIS — I5042 Chronic combined systolic (congestive) and diastolic (congestive) heart failure: Secondary | ICD-10-CM | POA: Diagnosis not present

## 2024-03-10 DIAGNOSIS — K219 Gastro-esophageal reflux disease without esophagitis: Secondary | ICD-10-CM | POA: Diagnosis not present

## 2024-03-10 DIAGNOSIS — M48061 Spinal stenosis, lumbar region without neurogenic claudication: Secondary | ICD-10-CM | POA: Diagnosis not present

## 2024-03-10 DIAGNOSIS — J449 Chronic obstructive pulmonary disease, unspecified: Secondary | ICD-10-CM | POA: Diagnosis not present

## 2024-03-10 DIAGNOSIS — E1122 Type 2 diabetes mellitus with diabetic chronic kidney disease: Secondary | ICD-10-CM | POA: Diagnosis not present

## 2024-03-10 DIAGNOSIS — J9611 Chronic respiratory failure with hypoxia: Secondary | ICD-10-CM | POA: Diagnosis not present

## 2024-03-10 DIAGNOSIS — R945 Abnormal results of liver function studies: Secondary | ICD-10-CM | POA: Diagnosis not present

## 2024-03-10 DIAGNOSIS — M6281 Muscle weakness (generalized): Secondary | ICD-10-CM | POA: Diagnosis not present

## 2024-03-10 DIAGNOSIS — I130 Hypertensive heart and chronic kidney disease with heart failure and stage 1 through stage 4 chronic kidney disease, or unspecified chronic kidney disease: Secondary | ICD-10-CM | POA: Diagnosis not present

## 2024-03-10 DIAGNOSIS — E785 Hyperlipidemia, unspecified: Secondary | ICD-10-CM | POA: Diagnosis not present

## 2024-03-10 DIAGNOSIS — D631 Anemia in chronic kidney disease: Secondary | ICD-10-CM | POA: Diagnosis not present

## 2024-03-17 DIAGNOSIS — R296 Repeated falls: Secondary | ICD-10-CM | POA: Diagnosis not present

## 2024-03-17 DIAGNOSIS — G4700 Insomnia, unspecified: Secondary | ICD-10-CM | POA: Diagnosis not present

## 2024-03-17 DIAGNOSIS — J9611 Chronic respiratory failure with hypoxia: Secondary | ICD-10-CM | POA: Diagnosis not present

## 2024-03-17 DIAGNOSIS — E1151 Type 2 diabetes mellitus with diabetic peripheral angiopathy without gangrene: Secondary | ICD-10-CM | POA: Diagnosis not present

## 2024-03-17 DIAGNOSIS — N184 Chronic kidney disease, stage 4 (severe): Secondary | ICD-10-CM | POA: Diagnosis not present

## 2024-03-17 DIAGNOSIS — K829 Disease of gallbladder, unspecified: Secondary | ICD-10-CM | POA: Diagnosis not present

## 2024-03-17 DIAGNOSIS — E785 Hyperlipidemia, unspecified: Secondary | ICD-10-CM | POA: Diagnosis not present

## 2024-03-17 DIAGNOSIS — K838 Other specified diseases of biliary tract: Secondary | ICD-10-CM | POA: Diagnosis not present

## 2024-03-17 DIAGNOSIS — M48061 Spinal stenosis, lumbar region without neurogenic claudication: Secondary | ICD-10-CM | POA: Diagnosis not present

## 2024-03-17 DIAGNOSIS — G894 Chronic pain syndrome: Secondary | ICD-10-CM | POA: Diagnosis not present

## 2024-03-17 DIAGNOSIS — E1122 Type 2 diabetes mellitus with diabetic chronic kidney disease: Secondary | ICD-10-CM | POA: Diagnosis not present

## 2024-03-17 DIAGNOSIS — J449 Chronic obstructive pulmonary disease, unspecified: Secondary | ICD-10-CM | POA: Diagnosis not present

## 2024-03-17 DIAGNOSIS — M6281 Muscle weakness (generalized): Secondary | ICD-10-CM | POA: Diagnosis not present

## 2024-03-17 DIAGNOSIS — K219 Gastro-esophageal reflux disease without esophagitis: Secondary | ICD-10-CM | POA: Diagnosis not present

## 2024-03-17 DIAGNOSIS — I480 Paroxysmal atrial fibrillation: Secondary | ICD-10-CM | POA: Diagnosis not present

## 2024-03-17 DIAGNOSIS — I5042 Chronic combined systolic (congestive) and diastolic (congestive) heart failure: Secondary | ICD-10-CM | POA: Diagnosis not present

## 2024-03-17 DIAGNOSIS — D631 Anemia in chronic kidney disease: Secondary | ICD-10-CM | POA: Diagnosis not present

## 2024-03-17 DIAGNOSIS — E46 Unspecified protein-calorie malnutrition: Secondary | ICD-10-CM | POA: Diagnosis not present

## 2024-03-17 DIAGNOSIS — R269 Unspecified abnormalities of gait and mobility: Secondary | ICD-10-CM | POA: Diagnosis not present

## 2024-03-17 DIAGNOSIS — I130 Hypertensive heart and chronic kidney disease with heart failure and stage 1 through stage 4 chronic kidney disease, or unspecified chronic kidney disease: Secondary | ICD-10-CM | POA: Diagnosis not present

## 2024-03-17 DIAGNOSIS — I700 Atherosclerosis of aorta: Secondary | ICD-10-CM | POA: Diagnosis not present

## 2024-03-22 DIAGNOSIS — E1140 Type 2 diabetes mellitus with diabetic neuropathy, unspecified: Secondary | ICD-10-CM | POA: Diagnosis not present

## 2024-03-22 DIAGNOSIS — R2689 Other abnormalities of gait and mobility: Secondary | ICD-10-CM | POA: Diagnosis not present

## 2024-03-22 DIAGNOSIS — L603 Nail dystrophy: Secondary | ICD-10-CM | POA: Diagnosis not present

## 2024-03-22 DIAGNOSIS — M79674 Pain in right toe(s): Secondary | ICD-10-CM | POA: Diagnosis not present

## 2024-03-22 DIAGNOSIS — M79675 Pain in left toe(s): Secondary | ICD-10-CM | POA: Diagnosis not present

## 2024-03-22 DIAGNOSIS — B351 Tinea unguium: Secondary | ICD-10-CM | POA: Diagnosis not present

## 2024-03-22 DIAGNOSIS — L851 Acquired keratosis [keratoderma] palmaris et plantaris: Secondary | ICD-10-CM | POA: Diagnosis not present

## 2024-03-23 DIAGNOSIS — E785 Hyperlipidemia, unspecified: Secondary | ICD-10-CM | POA: Diagnosis not present

## 2024-03-23 DIAGNOSIS — I5042 Chronic combined systolic (congestive) and diastolic (congestive) heart failure: Secondary | ICD-10-CM | POA: Diagnosis not present

## 2024-03-23 DIAGNOSIS — K829 Disease of gallbladder, unspecified: Secondary | ICD-10-CM | POA: Diagnosis not present

## 2024-03-23 DIAGNOSIS — N184 Chronic kidney disease, stage 4 (severe): Secondary | ICD-10-CM | POA: Diagnosis not present

## 2024-03-23 DIAGNOSIS — I130 Hypertensive heart and chronic kidney disease with heart failure and stage 1 through stage 4 chronic kidney disease, or unspecified chronic kidney disease: Secondary | ICD-10-CM | POA: Diagnosis not present

## 2024-03-23 DIAGNOSIS — K219 Gastro-esophageal reflux disease without esophagitis: Secondary | ICD-10-CM | POA: Diagnosis not present

## 2024-03-23 DIAGNOSIS — E46 Unspecified protein-calorie malnutrition: Secondary | ICD-10-CM | POA: Diagnosis not present

## 2024-03-23 DIAGNOSIS — E1122 Type 2 diabetes mellitus with diabetic chronic kidney disease: Secondary | ICD-10-CM | POA: Diagnosis not present

## 2024-03-23 DIAGNOSIS — G4700 Insomnia, unspecified: Secondary | ICD-10-CM | POA: Diagnosis not present

## 2024-03-23 DIAGNOSIS — I700 Atherosclerosis of aorta: Secondary | ICD-10-CM | POA: Diagnosis not present

## 2024-03-23 DIAGNOSIS — M48061 Spinal stenosis, lumbar region without neurogenic claudication: Secondary | ICD-10-CM | POA: Diagnosis not present

## 2024-03-23 DIAGNOSIS — K838 Other specified diseases of biliary tract: Secondary | ICD-10-CM | POA: Diagnosis not present

## 2024-03-23 DIAGNOSIS — I480 Paroxysmal atrial fibrillation: Secondary | ICD-10-CM | POA: Diagnosis not present

## 2024-03-23 DIAGNOSIS — G894 Chronic pain syndrome: Secondary | ICD-10-CM | POA: Diagnosis not present

## 2024-03-23 DIAGNOSIS — M6281 Muscle weakness (generalized): Secondary | ICD-10-CM | POA: Diagnosis not present

## 2024-03-23 DIAGNOSIS — D631 Anemia in chronic kidney disease: Secondary | ICD-10-CM | POA: Diagnosis not present

## 2024-03-23 DIAGNOSIS — J9611 Chronic respiratory failure with hypoxia: Secondary | ICD-10-CM | POA: Diagnosis not present

## 2024-03-23 DIAGNOSIS — R296 Repeated falls: Secondary | ICD-10-CM | POA: Diagnosis not present

## 2024-03-23 DIAGNOSIS — E1151 Type 2 diabetes mellitus with diabetic peripheral angiopathy without gangrene: Secondary | ICD-10-CM | POA: Diagnosis not present

## 2024-03-23 DIAGNOSIS — J449 Chronic obstructive pulmonary disease, unspecified: Secondary | ICD-10-CM | POA: Diagnosis not present

## 2024-03-23 DIAGNOSIS — R269 Unspecified abnormalities of gait and mobility: Secondary | ICD-10-CM | POA: Diagnosis not present

## 2024-03-27 DIAGNOSIS — M6281 Muscle weakness (generalized): Secondary | ICD-10-CM | POA: Diagnosis not present

## 2024-03-27 DIAGNOSIS — K219 Gastro-esophageal reflux disease without esophagitis: Secondary | ICD-10-CM | POA: Diagnosis not present

## 2024-03-27 DIAGNOSIS — E785 Hyperlipidemia, unspecified: Secondary | ICD-10-CM | POA: Diagnosis not present

## 2024-03-27 DIAGNOSIS — R269 Unspecified abnormalities of gait and mobility: Secondary | ICD-10-CM | POA: Diagnosis not present

## 2024-03-27 DIAGNOSIS — J449 Chronic obstructive pulmonary disease, unspecified: Secondary | ICD-10-CM | POA: Diagnosis not present

## 2024-03-27 DIAGNOSIS — M48061 Spinal stenosis, lumbar region without neurogenic claudication: Secondary | ICD-10-CM | POA: Diagnosis not present

## 2024-03-27 DIAGNOSIS — E1122 Type 2 diabetes mellitus with diabetic chronic kidney disease: Secondary | ICD-10-CM | POA: Diagnosis not present

## 2024-03-27 DIAGNOSIS — K829 Disease of gallbladder, unspecified: Secondary | ICD-10-CM | POA: Diagnosis not present

## 2024-03-27 DIAGNOSIS — I480 Paroxysmal atrial fibrillation: Secondary | ICD-10-CM | POA: Diagnosis not present

## 2024-03-27 DIAGNOSIS — E1151 Type 2 diabetes mellitus with diabetic peripheral angiopathy without gangrene: Secondary | ICD-10-CM | POA: Diagnosis not present

## 2024-03-27 DIAGNOSIS — I130 Hypertensive heart and chronic kidney disease with heart failure and stage 1 through stage 4 chronic kidney disease, or unspecified chronic kidney disease: Secondary | ICD-10-CM | POA: Diagnosis not present

## 2024-03-27 DIAGNOSIS — D631 Anemia in chronic kidney disease: Secondary | ICD-10-CM | POA: Diagnosis not present

## 2024-03-27 DIAGNOSIS — K838 Other specified diseases of biliary tract: Secondary | ICD-10-CM | POA: Diagnosis not present

## 2024-03-27 DIAGNOSIS — I700 Atherosclerosis of aorta: Secondary | ICD-10-CM | POA: Diagnosis not present

## 2024-03-27 DIAGNOSIS — R296 Repeated falls: Secondary | ICD-10-CM | POA: Diagnosis not present

## 2024-03-27 DIAGNOSIS — E46 Unspecified protein-calorie malnutrition: Secondary | ICD-10-CM | POA: Diagnosis not present

## 2024-03-27 DIAGNOSIS — G894 Chronic pain syndrome: Secondary | ICD-10-CM | POA: Diagnosis not present

## 2024-03-27 DIAGNOSIS — G4700 Insomnia, unspecified: Secondary | ICD-10-CM | POA: Diagnosis not present

## 2024-03-27 DIAGNOSIS — I5042 Chronic combined systolic (congestive) and diastolic (congestive) heart failure: Secondary | ICD-10-CM | POA: Diagnosis not present

## 2024-03-27 DIAGNOSIS — N184 Chronic kidney disease, stage 4 (severe): Secondary | ICD-10-CM | POA: Diagnosis not present

## 2024-03-27 DIAGNOSIS — J9611 Chronic respiratory failure with hypoxia: Secondary | ICD-10-CM | POA: Diagnosis not present

## 2024-03-30 ENCOUNTER — Encounter (INDEPENDENT_AMBULATORY_CARE_PROVIDER_SITE_OTHER): Payer: Self-pay | Admitting: *Deleted

## 2024-03-30 DIAGNOSIS — M25511 Pain in right shoulder: Secondary | ICD-10-CM | POA: Diagnosis not present

## 2024-04-03 ENCOUNTER — Telehealth: Payer: Self-pay | Admitting: Family Medicine

## 2024-04-03 NOTE — Telephone Encounter (Signed)
 Copied from CRM [id number]. Topic: General - Other >> Apr 03, 2024  1:07 PM Donald Frost wrote: Reason for CRM: Hayley with [facility] called in regarding some physical therapy orders from last year that were not signed. She is going to re fax them back over to get signed. Please assist her as soon as possible. Her contact number is [phone]

## 2024-04-03 NOTE — Telephone Encounter (Signed)
 Awaiting fax.

## 2024-04-06 DIAGNOSIS — K219 Gastro-esophageal reflux disease without esophagitis: Secondary | ICD-10-CM | POA: Diagnosis not present

## 2024-04-06 DIAGNOSIS — K829 Disease of gallbladder, unspecified: Secondary | ICD-10-CM | POA: Diagnosis not present

## 2024-04-06 DIAGNOSIS — I5042 Chronic combined systolic (congestive) and diastolic (congestive) heart failure: Secondary | ICD-10-CM | POA: Diagnosis not present

## 2024-04-06 DIAGNOSIS — E785 Hyperlipidemia, unspecified: Secondary | ICD-10-CM | POA: Diagnosis not present

## 2024-04-06 DIAGNOSIS — R269 Unspecified abnormalities of gait and mobility: Secondary | ICD-10-CM | POA: Diagnosis not present

## 2024-04-06 DIAGNOSIS — G894 Chronic pain syndrome: Secondary | ICD-10-CM | POA: Diagnosis not present

## 2024-04-06 DIAGNOSIS — M6281 Muscle weakness (generalized): Secondary | ICD-10-CM | POA: Diagnosis not present

## 2024-04-06 DIAGNOSIS — M48061 Spinal stenosis, lumbar region without neurogenic claudication: Secondary | ICD-10-CM | POA: Diagnosis not present

## 2024-04-06 DIAGNOSIS — E1151 Type 2 diabetes mellitus with diabetic peripheral angiopathy without gangrene: Secondary | ICD-10-CM | POA: Diagnosis not present

## 2024-04-06 DIAGNOSIS — G4700 Insomnia, unspecified: Secondary | ICD-10-CM | POA: Diagnosis not present

## 2024-04-06 DIAGNOSIS — I700 Atherosclerosis of aorta: Secondary | ICD-10-CM | POA: Diagnosis not present

## 2024-04-06 DIAGNOSIS — K838 Other specified diseases of biliary tract: Secondary | ICD-10-CM | POA: Diagnosis not present

## 2024-04-06 DIAGNOSIS — N184 Chronic kidney disease, stage 4 (severe): Secondary | ICD-10-CM | POA: Diagnosis not present

## 2024-04-06 DIAGNOSIS — I130 Hypertensive heart and chronic kidney disease with heart failure and stage 1 through stage 4 chronic kidney disease, or unspecified chronic kidney disease: Secondary | ICD-10-CM | POA: Diagnosis not present

## 2024-04-06 DIAGNOSIS — J449 Chronic obstructive pulmonary disease, unspecified: Secondary | ICD-10-CM | POA: Diagnosis not present

## 2024-04-06 DIAGNOSIS — R296 Repeated falls: Secondary | ICD-10-CM | POA: Diagnosis not present

## 2024-04-06 DIAGNOSIS — E1122 Type 2 diabetes mellitus with diabetic chronic kidney disease: Secondary | ICD-10-CM | POA: Diagnosis not present

## 2024-04-06 DIAGNOSIS — D631 Anemia in chronic kidney disease: Secondary | ICD-10-CM | POA: Diagnosis not present

## 2024-04-06 DIAGNOSIS — I480 Paroxysmal atrial fibrillation: Secondary | ICD-10-CM | POA: Diagnosis not present

## 2024-04-06 DIAGNOSIS — J9611 Chronic respiratory failure with hypoxia: Secondary | ICD-10-CM | POA: Diagnosis not present

## 2024-04-06 DIAGNOSIS — E46 Unspecified protein-calorie malnutrition: Secondary | ICD-10-CM | POA: Diagnosis not present

## 2024-04-14 DIAGNOSIS — K838 Other specified diseases of biliary tract: Secondary | ICD-10-CM | POA: Diagnosis not present

## 2024-04-14 DIAGNOSIS — K219 Gastro-esophageal reflux disease without esophagitis: Secondary | ICD-10-CM | POA: Diagnosis not present

## 2024-04-14 DIAGNOSIS — R269 Unspecified abnormalities of gait and mobility: Secondary | ICD-10-CM | POA: Diagnosis not present

## 2024-04-14 DIAGNOSIS — I480 Paroxysmal atrial fibrillation: Secondary | ICD-10-CM | POA: Diagnosis not present

## 2024-04-14 DIAGNOSIS — E785 Hyperlipidemia, unspecified: Secondary | ICD-10-CM | POA: Diagnosis not present

## 2024-04-14 DIAGNOSIS — J449 Chronic obstructive pulmonary disease, unspecified: Secondary | ICD-10-CM | POA: Diagnosis not present

## 2024-04-14 DIAGNOSIS — E1122 Type 2 diabetes mellitus with diabetic chronic kidney disease: Secondary | ICD-10-CM | POA: Diagnosis not present

## 2024-04-14 DIAGNOSIS — J9611 Chronic respiratory failure with hypoxia: Secondary | ICD-10-CM | POA: Diagnosis not present

## 2024-04-14 DIAGNOSIS — M6281 Muscle weakness (generalized): Secondary | ICD-10-CM | POA: Diagnosis not present

## 2024-04-14 DIAGNOSIS — I5042 Chronic combined systolic (congestive) and diastolic (congestive) heart failure: Secondary | ICD-10-CM | POA: Diagnosis not present

## 2024-04-14 DIAGNOSIS — I700 Atherosclerosis of aorta: Secondary | ICD-10-CM | POA: Diagnosis not present

## 2024-04-14 DIAGNOSIS — K829 Disease of gallbladder, unspecified: Secondary | ICD-10-CM | POA: Diagnosis not present

## 2024-04-14 DIAGNOSIS — D631 Anemia in chronic kidney disease: Secondary | ICD-10-CM | POA: Diagnosis not present

## 2024-04-14 DIAGNOSIS — N184 Chronic kidney disease, stage 4 (severe): Secondary | ICD-10-CM | POA: Diagnosis not present

## 2024-04-14 DIAGNOSIS — E46 Unspecified protein-calorie malnutrition: Secondary | ICD-10-CM | POA: Diagnosis not present

## 2024-04-14 DIAGNOSIS — G4700 Insomnia, unspecified: Secondary | ICD-10-CM | POA: Diagnosis not present

## 2024-04-14 DIAGNOSIS — M48061 Spinal stenosis, lumbar region without neurogenic claudication: Secondary | ICD-10-CM | POA: Diagnosis not present

## 2024-04-14 DIAGNOSIS — R296 Repeated falls: Secondary | ICD-10-CM | POA: Diagnosis not present

## 2024-04-14 DIAGNOSIS — G894 Chronic pain syndrome: Secondary | ICD-10-CM | POA: Diagnosis not present

## 2024-04-14 DIAGNOSIS — I130 Hypertensive heart and chronic kidney disease with heart failure and stage 1 through stage 4 chronic kidney disease, or unspecified chronic kidney disease: Secondary | ICD-10-CM | POA: Diagnosis not present

## 2024-04-14 DIAGNOSIS — E1151 Type 2 diabetes mellitus with diabetic peripheral angiopathy without gangrene: Secondary | ICD-10-CM | POA: Diagnosis not present

## 2024-04-19 DIAGNOSIS — G894 Chronic pain syndrome: Secondary | ICD-10-CM | POA: Diagnosis not present

## 2024-04-19 DIAGNOSIS — J449 Chronic obstructive pulmonary disease, unspecified: Secondary | ICD-10-CM | POA: Diagnosis not present

## 2024-04-19 DIAGNOSIS — K838 Other specified diseases of biliary tract: Secondary | ICD-10-CM | POA: Diagnosis not present

## 2024-04-19 DIAGNOSIS — I700 Atherosclerosis of aorta: Secondary | ICD-10-CM | POA: Diagnosis not present

## 2024-04-19 DIAGNOSIS — E1122 Type 2 diabetes mellitus with diabetic chronic kidney disease: Secondary | ICD-10-CM | POA: Diagnosis not present

## 2024-04-19 DIAGNOSIS — R269 Unspecified abnormalities of gait and mobility: Secondary | ICD-10-CM | POA: Diagnosis not present

## 2024-04-19 DIAGNOSIS — E1151 Type 2 diabetes mellitus with diabetic peripheral angiopathy without gangrene: Secondary | ICD-10-CM | POA: Diagnosis not present

## 2024-04-19 DIAGNOSIS — I130 Hypertensive heart and chronic kidney disease with heart failure and stage 1 through stage 4 chronic kidney disease, or unspecified chronic kidney disease: Secondary | ICD-10-CM | POA: Diagnosis not present

## 2024-04-19 DIAGNOSIS — M48061 Spinal stenosis, lumbar region without neurogenic claudication: Secondary | ICD-10-CM | POA: Diagnosis not present

## 2024-04-19 DIAGNOSIS — R296 Repeated falls: Secondary | ICD-10-CM | POA: Diagnosis not present

## 2024-04-19 DIAGNOSIS — E46 Unspecified protein-calorie malnutrition: Secondary | ICD-10-CM | POA: Diagnosis not present

## 2024-04-19 DIAGNOSIS — N184 Chronic kidney disease, stage 4 (severe): Secondary | ICD-10-CM | POA: Diagnosis not present

## 2024-04-19 DIAGNOSIS — G4700 Insomnia, unspecified: Secondary | ICD-10-CM | POA: Diagnosis not present

## 2024-04-19 DIAGNOSIS — M6281 Muscle weakness (generalized): Secondary | ICD-10-CM | POA: Diagnosis not present

## 2024-04-19 DIAGNOSIS — I480 Paroxysmal atrial fibrillation: Secondary | ICD-10-CM | POA: Diagnosis not present

## 2024-04-19 DIAGNOSIS — E785 Hyperlipidemia, unspecified: Secondary | ICD-10-CM | POA: Diagnosis not present

## 2024-04-19 DIAGNOSIS — J9611 Chronic respiratory failure with hypoxia: Secondary | ICD-10-CM | POA: Diagnosis not present

## 2024-04-19 DIAGNOSIS — K219 Gastro-esophageal reflux disease without esophagitis: Secondary | ICD-10-CM | POA: Diagnosis not present

## 2024-04-19 DIAGNOSIS — D631 Anemia in chronic kidney disease: Secondary | ICD-10-CM | POA: Diagnosis not present

## 2024-04-19 DIAGNOSIS — K829 Disease of gallbladder, unspecified: Secondary | ICD-10-CM | POA: Diagnosis not present

## 2024-04-19 DIAGNOSIS — I5042 Chronic combined systolic (congestive) and diastolic (congestive) heart failure: Secondary | ICD-10-CM | POA: Diagnosis not present

## 2024-04-25 DIAGNOSIS — E46 Unspecified protein-calorie malnutrition: Secondary | ICD-10-CM | POA: Diagnosis not present

## 2024-04-25 DIAGNOSIS — K838 Other specified diseases of biliary tract: Secondary | ICD-10-CM | POA: Diagnosis not present

## 2024-04-25 DIAGNOSIS — I5042 Chronic combined systolic (congestive) and diastolic (congestive) heart failure: Secondary | ICD-10-CM | POA: Diagnosis not present

## 2024-04-25 DIAGNOSIS — D631 Anemia in chronic kidney disease: Secondary | ICD-10-CM | POA: Diagnosis not present

## 2024-04-25 DIAGNOSIS — J9611 Chronic respiratory failure with hypoxia: Secondary | ICD-10-CM | POA: Diagnosis not present

## 2024-04-25 DIAGNOSIS — I130 Hypertensive heart and chronic kidney disease with heart failure and stage 1 through stage 4 chronic kidney disease, or unspecified chronic kidney disease: Secondary | ICD-10-CM | POA: Diagnosis not present

## 2024-04-25 DIAGNOSIS — E785 Hyperlipidemia, unspecified: Secondary | ICD-10-CM | POA: Diagnosis not present

## 2024-04-25 DIAGNOSIS — E1151 Type 2 diabetes mellitus with diabetic peripheral angiopathy without gangrene: Secondary | ICD-10-CM | POA: Diagnosis not present

## 2024-04-25 DIAGNOSIS — J449 Chronic obstructive pulmonary disease, unspecified: Secondary | ICD-10-CM | POA: Diagnosis not present

## 2024-04-25 DIAGNOSIS — I480 Paroxysmal atrial fibrillation: Secondary | ICD-10-CM | POA: Diagnosis not present

## 2024-04-25 DIAGNOSIS — I700 Atherosclerosis of aorta: Secondary | ICD-10-CM | POA: Diagnosis not present

## 2024-04-25 DIAGNOSIS — R296 Repeated falls: Secondary | ICD-10-CM | POA: Diagnosis not present

## 2024-04-25 DIAGNOSIS — G4700 Insomnia, unspecified: Secondary | ICD-10-CM | POA: Diagnosis not present

## 2024-04-25 DIAGNOSIS — M48061 Spinal stenosis, lumbar region without neurogenic claudication: Secondary | ICD-10-CM | POA: Diagnosis not present

## 2024-04-25 DIAGNOSIS — M6281 Muscle weakness (generalized): Secondary | ICD-10-CM | POA: Diagnosis not present

## 2024-04-25 DIAGNOSIS — G894 Chronic pain syndrome: Secondary | ICD-10-CM | POA: Diagnosis not present

## 2024-04-25 DIAGNOSIS — R269 Unspecified abnormalities of gait and mobility: Secondary | ICD-10-CM | POA: Diagnosis not present

## 2024-04-25 DIAGNOSIS — K219 Gastro-esophageal reflux disease without esophagitis: Secondary | ICD-10-CM | POA: Diagnosis not present

## 2024-04-25 DIAGNOSIS — E1122 Type 2 diabetes mellitus with diabetic chronic kidney disease: Secondary | ICD-10-CM | POA: Diagnosis not present

## 2024-04-25 DIAGNOSIS — N184 Chronic kidney disease, stage 4 (severe): Secondary | ICD-10-CM | POA: Diagnosis not present

## 2024-04-25 DIAGNOSIS — K829 Disease of gallbladder, unspecified: Secondary | ICD-10-CM | POA: Diagnosis not present

## 2024-04-26 DIAGNOSIS — N185 Chronic kidney disease, stage 5: Secondary | ICD-10-CM | POA: Diagnosis not present

## 2024-04-26 DIAGNOSIS — D649 Anemia, unspecified: Secondary | ICD-10-CM | POA: Diagnosis not present

## 2024-04-26 DIAGNOSIS — R809 Proteinuria, unspecified: Secondary | ICD-10-CM | POA: Diagnosis not present

## 2024-04-26 DIAGNOSIS — E211 Secondary hyperparathyroidism, not elsewhere classified: Secondary | ICD-10-CM | POA: Diagnosis not present

## 2024-04-26 DIAGNOSIS — N189 Chronic kidney disease, unspecified: Secondary | ICD-10-CM | POA: Diagnosis not present

## 2024-04-26 DIAGNOSIS — E119 Type 2 diabetes mellitus without complications: Secondary | ICD-10-CM | POA: Diagnosis not present

## 2024-04-26 DIAGNOSIS — R7989 Other specified abnormal findings of blood chemistry: Secondary | ICD-10-CM | POA: Diagnosis not present

## 2024-04-27 LAB — NASH FIBROSURE(R) PLUS

## 2024-04-28 LAB — COMPREHENSIVE METABOLIC PANEL WITH GFR
ALT: 53 IU/L — ABNORMAL HIGH (ref 0–32)
AST: 30 IU/L (ref 0–40)
Albumin: 4 g/dL (ref 3.8–4.9)
Alkaline Phosphatase: 89 IU/L (ref 44–121)
BUN/Creatinine Ratio: 15 (ref 9–23)
BUN: 60 mg/dL — ABNORMAL HIGH (ref 6–24)
Bilirubin Total: 0.2 mg/dL (ref 0.0–1.2)
CO2: 18 mmol/L — ABNORMAL LOW (ref 20–29)
Calcium: 9.9 mg/dL (ref 8.7–10.2)
Chloride: 107 mmol/L — ABNORMAL HIGH (ref 96–106)
Creatinine, Ser: 3.93 mg/dL — ABNORMAL HIGH (ref 0.57–1.00)
Globulin, Total: 2.6 g/dL (ref 1.5–4.5)
Glucose: 118 mg/dL — ABNORMAL HIGH (ref 70–99)
Potassium: 4.8 mmol/L (ref 3.5–5.2)
Sodium: 142 mmol/L (ref 134–144)
Total Protein: 6.6 g/dL (ref 6.0–8.5)
eGFR: 13 mL/min/1.73 — ABNORMAL LOW (ref >59)

## 2024-04-28 LAB — NASH FIBROSURE(R) PLUS
ALPHA 2-MACROGLOBULINS, QN: 169 mg/dL (ref 110–276)
ALT (SGPT) P5P: 62 IU/L — ABNORMAL HIGH (ref 0–40)
AST (SGOT) P5P: 33 IU/L (ref 0–40)
Apolipoprotein A-1: 132 mg/dL (ref 116–209)
Bilirubin, Total: 0.2 mg/dL (ref 0.0–1.2)
Cholesterol, Total: 263 mg/dL — ABNORMAL HIGH (ref 100–199)
Fibrosis Score: 0.06 (ref 0.00–0.21)
GGT: 18 IU/L (ref 0–60)
Glucose: 120 mg/dL — ABNORMAL HIGH (ref 70–99)
Haptoglobin: 295 mg/dL (ref 33–346)
NASH Score: 0.46 — ABNORMAL HIGH (ref 0.00–0.25)
Steatosis Score: 0.78 — ABNORMAL HIGH (ref 0.00–0.40)
Triglycerides: 193 mg/dL — ABNORMAL HIGH (ref 0–149)

## 2024-04-28 LAB — PROTIME-INR
INR: 1.1 (ref 0.9–1.2)
Prothrombin Time: 11.8 s (ref 9.1–12.0)

## 2024-04-28 LAB — ENHANCED LIVER FIBROSIS (ELF): ELF(TM) Score: 10.61 — ABNORMAL HIGH (ref <9.80)

## 2024-04-28 LAB — GAMMA GT: GGT: 18 IU/L (ref 0–60)

## 2024-04-30 ENCOUNTER — Ambulatory Visit: Payer: Self-pay | Admitting: Gastroenterology

## 2024-05-01 DIAGNOSIS — E46 Unspecified protein-calorie malnutrition: Secondary | ICD-10-CM | POA: Diagnosis not present

## 2024-05-01 DIAGNOSIS — D631 Anemia in chronic kidney disease: Secondary | ICD-10-CM | POA: Diagnosis not present

## 2024-05-01 DIAGNOSIS — I700 Atherosclerosis of aorta: Secondary | ICD-10-CM | POA: Diagnosis not present

## 2024-05-01 DIAGNOSIS — E785 Hyperlipidemia, unspecified: Secondary | ICD-10-CM | POA: Diagnosis not present

## 2024-05-01 DIAGNOSIS — I5042 Chronic combined systolic (congestive) and diastolic (congestive) heart failure: Secondary | ICD-10-CM | POA: Diagnosis not present

## 2024-05-01 DIAGNOSIS — I130 Hypertensive heart and chronic kidney disease with heart failure and stage 1 through stage 4 chronic kidney disease, or unspecified chronic kidney disease: Secondary | ICD-10-CM | POA: Diagnosis not present

## 2024-05-01 DIAGNOSIS — G894 Chronic pain syndrome: Secondary | ICD-10-CM | POA: Diagnosis not present

## 2024-05-01 DIAGNOSIS — K219 Gastro-esophageal reflux disease without esophagitis: Secondary | ICD-10-CM | POA: Diagnosis not present

## 2024-05-01 DIAGNOSIS — J9611 Chronic respiratory failure with hypoxia: Secondary | ICD-10-CM | POA: Diagnosis not present

## 2024-05-01 DIAGNOSIS — G4700 Insomnia, unspecified: Secondary | ICD-10-CM | POA: Diagnosis not present

## 2024-05-01 DIAGNOSIS — K829 Disease of gallbladder, unspecified: Secondary | ICD-10-CM | POA: Diagnosis not present

## 2024-05-01 DIAGNOSIS — I480 Paroxysmal atrial fibrillation: Secondary | ICD-10-CM | POA: Diagnosis not present

## 2024-05-01 DIAGNOSIS — R269 Unspecified abnormalities of gait and mobility: Secondary | ICD-10-CM | POA: Diagnosis not present

## 2024-05-01 DIAGNOSIS — E1122 Type 2 diabetes mellitus with diabetic chronic kidney disease: Secondary | ICD-10-CM | POA: Diagnosis not present

## 2024-05-01 DIAGNOSIS — R296 Repeated falls: Secondary | ICD-10-CM | POA: Diagnosis not present

## 2024-05-01 DIAGNOSIS — N184 Chronic kidney disease, stage 4 (severe): Secondary | ICD-10-CM | POA: Diagnosis not present

## 2024-05-01 DIAGNOSIS — M6281 Muscle weakness (generalized): Secondary | ICD-10-CM | POA: Diagnosis not present

## 2024-05-01 DIAGNOSIS — K838 Other specified diseases of biliary tract: Secondary | ICD-10-CM | POA: Diagnosis not present

## 2024-05-01 DIAGNOSIS — J449 Chronic obstructive pulmonary disease, unspecified: Secondary | ICD-10-CM | POA: Diagnosis not present

## 2024-05-01 DIAGNOSIS — M48061 Spinal stenosis, lumbar region without neurogenic claudication: Secondary | ICD-10-CM | POA: Diagnosis not present

## 2024-05-01 DIAGNOSIS — E1151 Type 2 diabetes mellitus with diabetic peripheral angiopathy without gangrene: Secondary | ICD-10-CM | POA: Diagnosis not present

## 2024-05-07 NOTE — Progress Notes (Unsigned)
 GI Office Note    Referring Provider: Barbaraann Harvey, FNP Primary Care Physician:  Barbaraann Harvey, FNP Primary Gastroenterologist: Toribio Rubins, MD  Date:  05/09/2024  ID:  [name], DOB [birth year], MRN [id number]  Chief Complaint   Chief Complaint  Patient presents with   Follow-up    Follow up. No problems    History of Present Illness  [name] is a 58 y.o. female with a history of alcohol  and cocaine abuse, CKD Stage IV, OSA, COPD, Afib on Eliquis , HTN, HLD, and seizures presenting today with no complaints.   EGD January 2022: -Grade C esophagitis without bleeding - Normal stomach s/p biopsy - Nonbleeding duodenal ulcers with clean ulcer base - Pathology negative for H. pylori.  Nonspecific reactive gastropathy - Advised PPI twice daily for 3 months then once a day indefinitely, avoid NSAIDs   Seen inpatient at [facility] 01/04/24-01/10/2024.  Presented with generalized weakness over 2 days prior as well as minimal p.o. intake.  Also present with AKI in the setting of stage IV CKD.  Acute hepatitis panel was negative as well as Tylenol  and ethanol levels.  Ammonia level at 11.  Platelets were stable although INR increased.  AST 4879, ALT 3761, alk phos 139.  Creatinine elevated at 4.91 on presentation.  RUQ US  with CBD measuring 14 mm.  There was probable tumefactive sludge in the gallbladder without shadowing stones or sonographic evidence of acute cholecystitis.  Fatty liver noted.  Possible medical renal disease of right kidney.  Was due to have dialysis catheter placed day after presentation to ED.  Additional serologies were ordered inpatient.  HSV 1 and 2 IgG and EBV positive which reveals exposure but unable to rule out recent infection.  CMV, ANA, ASMA, AMA, IgG, and HIV all negative/within normal limits.  Does have a history of alcohol  and cocaine use although ethanol level negative on admission.  She was given vitamin K  for increasing INR.  Ultrasound liver  Doppler was obtained with patent portal and hepatic veins.  Given her LFTs down trended during hospitalization, mostly suspected ischemia or drug-induced liver injury.  Urine drug screen was negative. Advised repeat HFP 2 weeks after discharge.  HFP has yet to be collected.  Last office visit 02/08/24.  Denied any constipation.  Having a bowel movement every 2 days which is usually normal for her.  No straining or abdominal pain.  Had been using a stool softener as needed.  Had reported some nausea but no vomiting, possibly secondary to pizza which she was eating for multiple days that had not been in the fridge.  Denied any GI bleeding.  Reported normal appetite and no changes. Labs ordered. Monitor for HE. Low fat diet. Stool softener as needed. Follow up 3 months.  Labs 04/26/24: ELF 10.61, Fibrosure F0, S2-3, N1. GGT normal. INR 1.1, AST 30, ALT 53, T. Bili 0.2.  Advised mediterranean diet balanced with renal diet and ensure good control of BP, blood sugars, and weight.   Today:  She reports vomiting but thinks it may have come from eating an oreo cheesecake - none prior to that. Maybe had too much chocolate in it. She wanted cherry cheese cake. No dysphagia.  Denies any nausea, vomiting, chest pain, shortness of breath, early satiety.  Reports a good appetite.  She is not getting her BG checked anymore - has not been getting insulin .   She has lost some weight and wants to lose more.  Weight overall stable.  Denies  any lower extremity swelling  Stopped stool softener and denies any issues with bowel movements.  Denies any overt constipation, diarrhea, abdominal pain, melena, or BRBPR.   Fibrosis 4 Score = 1.28 (Low risk)        Interpretation for patients with NAFLD          <1.30       -  F0-F1 (Low risk)          1.30-2.67 -  Indeterminate           >2.67      -  F3-F4 (High risk)     Validated for ages 7-65     Roommate about to drive her crazy. This has been making her very anxious.  Feels like she is always having to look over her shoulder.    Wt Readings from Last 5 Encounters:  05/09/24 155 lb (70.3 kg)  02/10/24 158 lb (71.7 kg)  02/08/24 159 lb 8 oz (72.3 kg)  02/01/24 171 lb (77.6 kg)  01/10/24 173 lb 15.1 oz (78.9 kg)    Current Outpatient Medications  Medication Sig Dispense Refill   acetaminophen  (TYLENOL ) 325 MG tablet Take 2 tablets (650 mg total) by mouth every 6 (six) hours as needed for headache, fever or mild pain.     amLODipine  (NORVASC ) 5 MG tablet Take 1 tablet (5 mg total) by mouth daily. 30 tablet 1   ammonium lactate  (AMLACTIN) 12 % lotion Apply 1 Application topically as needed for dry skin. 400 g 4   apixaban  (ELIQUIS ) 5 MG TABS tablet Take 1 tablet (5 mg total) by mouth 2 (two) times daily. 60 tablet 0   ARIPiprazole  (ABILIFY ) 5 MG tablet Take 1 tablet (5 mg total) by mouth daily. 60 tablet 0   azelastine  (OPTIVAR ) 0.05 % ophthalmic solution Place 1 drop into both eyes 2 (two) times daily.     calcitRIOL  (ROCALTROL ) 0.25 MCG capsule Take 1 capsule (0.25 mcg total) by mouth daily. 30 capsule 0   FLUoxetine  (PROZAC ) 40 MG capsule Take 2 pills once a day 60 capsule 3   fluticasone  (FLONASE ) 50 MCG/ACT nasal spray Place 2 sprays into both nostrils daily. 16 g 6   loperamide  (IMODIUM ) 2 MG capsule Take 4 mg by mouth every 4 (four) hours as needed for diarrhea or loose stools.     metoprolol  succinate (TOPROL -XL) 25 MG 24 hr tablet Take 1 tablet (25 mg total) by mouth daily. 30 tablet 0   olopatadine  (PATADAY ) 0.1 % ophthalmic solution Place 1 drop into both eyes 2 (two) times daily.     ondansetron  (ZOFRAN -ODT) 4 MG disintegrating tablet Take 4 mg by mouth every 8 (eight) hours as needed for nausea or vomiting.     oxybutynin  (DITROPAN -XL) 5 MG 24 hr tablet Take 1 tablet (5 mg total) by mouth at bedtime.     polyethylene glycol (MIRALAX  / GLYCOLAX ) 17 g packet Take 17 g by mouth daily. 30 each 0   thiamine  (VITAMIN B1) 100 MG tablet TAKE (1) TABLET  BY MOUTH ONCE DAILY. 30 tablet 0   torsemide  (DEMADEX ) 20 MG tablet Take 1 tablet (20 mg total) by mouth daily. 30 tablet 0   No current facility-administered medications for this visit.    Past Medical History:  Diagnosis Date   Acute lower UTI 06/04/2018   Acute metabolic encephalopathy 03/11/2021   Acute on chronic respiratory failure with hypoxia (HCC) 07/20/2021   Acute respiratory failure requiring reintubation (HCC) 11/09/2012   Acute respiratory  failure with hypoxia (HCC) 12/03/2020   AKI (acute kidney injury) (HCC) 06/04/2018   Alcohol  use    Ankle fracture, lateral malleolus, closed 2013   Anxiety    Aortic atherosclerosis (HCC) 03/10/2021   Atherosclerosis of native artery of both legs with gangrene (HCC)    Breast mass, left 2013   CHF (congestive heart failure) (HCC)    a. EF 20-25% by echo in 05/2016 with cath showing normal cors b. EF 50-55% in 07/2020 c. 01/2021: EF at 55-60% with moderate LVH   Chronic anemia    CKD (chronic kidney disease), stage III (HCC)    Cocaine abuse (HCC)    COPD (chronic obstructive pulmonary disease) (HCC)    Diabetes mellitus, type 2 (HCC)    Diabetic Charcot foot (HCC)    DKA (diabetic ketoacidosis) (HCC) 10/29/2020   Essential hypertension    H/O open hand wound 11/22/2018   History of cardiomyopathy    History of GI bleed 2011   Hyperlipidemia    Lactic acid acidosis 09/11/2014   Metabolic encephalopathy 03/11/2021   Noncompliance    Obesity    Oral candidiasis 03/10/2021   PAF (paroxysmal atrial fibrillation) (HCC)    Panic attacks    PAT (paroxysmal atrial tachycardia) (HCC)    Previously on Amiodarone    Pneumonia due to COVID-19 virus 12/03/2020   Seizures (HCC) 10/01/2020   Sepsis due to skin infection (HCC) 04/01/2021   Shock (HCC) 10/29/2020   Sleep apnea    Not on CPAP   Stroke (HCC) 2018   Thrombocytosis 08/02/2017   Tobacco abuse    UGI bleed    Urinary incontinence     Past Surgical History:  Procedure  Laterality Date   ANGIOPLASTY ILLIAC ARTERY Right 04/23/2021   Procedure: ANGIOPLASTY AND STENT SUPERFICIAL FEMORAL ARTERY;  Surgeon: Magda Debby SAILOR, MD;  Location: [facility] OR;  Service: Vascular;  Laterality: Right;   AORTOGRAM  04/23/2021   Procedure: AORTOGRAM;  Surgeon: Magda Debby SAILOR, MD;  Location: [facility] OR;  Service: Vascular;;   AORTOGRAM Left 04/30/2021   Procedure: left lower extremity angiogram with second order cannulation;  Surgeon: Magda Debby SAILOR, MD;  Location: [facility] OR;  Service: Vascular;  Laterality: Left;   BIOPSY  11/12/2020   Procedure: BIOPSY;  Surgeon: Eartha Angelia Sieving, MD;  Location: [facility] SUITE;  Service: Gastroenterology;;   BREAST BIOPSY     CARDIAC CATHETERIZATION N/A 07/28/2016   Procedure: Left Heart Cath and Coronary Angiography;  Surgeon: Candyce GORMAN Reek, MD;  Location: [facility] INVASIVE CV LAB;  Service: Cardiovascular;  Laterality: N/A;   COLONOSCOPY N/A 05/10/2019   Procedure: COLONOSCOPY;  Surgeon: Harvey Margo CROME, MD;  Location: [facility];  Service: Endoscopy;  Laterality: N/A;  Phenergan  12.5 mg IV in pre-op    DILATION AND CURETTAGE OF UTERUS     ESOPHAGOGASTRODUODENOSCOPY (EGD) WITH PROPOFOL  N/A 11/12/2020   Procedure: ESOPHAGOGASTRODUODENOSCOPY (EGD) WITH PROPOFOL ;  Surgeon: Eartha Angelia Sieving, MD;  Location: [facility];  Service: Gastroenterology;  Laterality: N/A;   I & D EXTREMITY Bilateral 09/22/2017   Procedure: BILATERAL DEBRIDEMENT LEG/FOOT ULCERS, APPLY VERAFLO WOUND VAC;  Surgeon: Harden Jerona GAILS, MD;  Location: [facility] OR;  Service: Orthopedics;  Laterality: Bilateral;   I & D EXTREMITY Right 10/11/2018   Procedure: IRRIGATION AND DEBRIDEMENT RIGHT HAND;  Surgeon: Camella Fallow, MD;  Location: [facility] OR;  Service: Orthopedics;  Laterality: Right;   I & D EXTREMITY Right 10/13/2018   Procedure: REPEAT IRRIGATION AND DEBRIDEMENT RIGHT HAND;  Surgeon:  Camella Fallow, MD;  Location[facility] OR;  Service: Orthopedics;  Laterality: Right;   I & D  EXTREMITY Right 11/22/2018   Procedure: IRRIGATION AND DEBRIDEMENT AND PINNING RIGHT HAND;  Surgeon: Camella Fallow, MD;  Location: [facility] OR;  Service: Orthopedics;  Laterality: Right;   IR RADIOLOGIST EVAL & MGMT  07/05/2018   LOWER EXTREMITY ANGIOGRAM Bilateral 04/23/2021   Procedure: RIGHT  AND LEFT LOWER EXTREMITY ANGIOGRAM;  Surgeon: Magda Debby SAILOR, MD;  Location: [facility] OR;  Service: Vascular;  Laterality: Bilateral;   POLYPECTOMY  05/10/2019   Procedure: POLYPECTOMY;  Surgeon: Harvey Margo CROME, MD;  Location[facility];  Service: Endoscopy;;   SKIN SPLIT GRAFT Bilateral 09/28/2017   Procedure: BILATERAL SPLIT THICKNESS SKIN GRAFT LEGS/FEET AND APPLY VAC;  Surgeon: Harden Jerona GAILS, MD;  Location: [facility] OR;  Service: Orthopedics;  Laterality: Bilateral;   SKIN SPLIT GRAFT Right 11/22/2018   Procedure: SKIN GRAFT SPLIT THICKNESS;  Surgeon: Camella Fallow, MD;  Location: [facility] OR;  Service: Orthopedics;  Laterality: Right;    Family History  Problem Relation Age of Onset   Hypertension Mother    Heart attack Mother    Hypertension Father        CABG    Hypertension Sister    Hypertension Brother    Hypertension Sister    Cancer Sister        breast    Arthritis Other    Cancer Other    Diabetes Other    Asthma Other    Hypertension Daughter    Hypertension Son     Allergies as of 05/09/2024 - Review Complete 05/09/2024  Allergen Reaction Noted   Sodium hypochlorite Anaphylaxis 07/29/2021    Social History   Socioeconomic History   Marital status: Widowed    Spouse name: Not on file   Number of children: 2   Years of education: 9   Highest education level: 9th grade  Occupational History   Occupation: Disabled  Tobacco Use   Smoking status: Former    Current packs/day: 0.00    Average packs/day: 0.3 packs/day for 20.0 years (5.0 ttl pk-yrs)    Types: Cigarettes    Start date: 06/03/1997    Quit date: 06/03/2017    Years since quitting: 6.9    Passive exposure: Never    Smokeless tobacco: Never  Vaping Use   Vaping status: Never Used  Substance and Sexual Activity   Alcohol  use: Not Currently    Alcohol /week: 0.0 standard drinks of alcohol     Comment: Quit in 2017   Drug use: Not Currently    Frequency: 3.0 times per week    Types: Marijuana, Cocaine    Comment: none since August 2018   Sexual activity: Not on file  Other Topics Concern   Not on file  Social History Narrative   Right handed   Drinks 1-2 cups caffeine daily   Social Drivers of Health   Financial Resource Strain: Medium Risk (10/15/2020)   Overall Financial Resource Strain (CARDIA)    Difficulty of Paying Living Expenses: Somewhat hard  Food Insecurity: No Food Insecurity (01/04/2024)   Hunger Vital Sign    Worried About Running Out of Food in the Last Year: Never true    Ran Out of Food in the Last Year: Never true  Transportation Needs: No Transportation Needs (01/04/2024)   PRAPARE - Administrator, Civil Service (Medical): No    Lack of Transportation (Non-Medical): No  Physical Activity: Insufficiently Active (  10/15/2020)   Exercise Vital Sign    Days of Exercise per Week: 7 days    Minutes of Exercise per Session: 20 min  Stress: Stress Concern Present (10/15/2020)   [employer] of Occupational Health - Occupational Stress Questionnaire    Feeling of Stress : To some extent  Social Connections: Moderately Isolated (10/15/2020)   Social Connection and Isolation Panel    Frequency of Communication with Friends and Family: More than three times a week    Frequency of Social Gatherings with Friends and Family: More than three times a week    Attends Religious Services: More than 4 times per year    Active Member of [employer] or Organizations: No    Attends Banker Meetings: Never    Marital Status: Widowed     Review of Systems   Gen: Denies fever, chills, anorexia. Denies fatigue, weakness, weight loss.  CV: Denies chest pain,  palpitations, syncope, peripheral edema, and claudication. Resp: Denies dyspnea at rest, cough, wheezing, coughing up blood, and pleurisy. GI: See HPI Derm: Denies rash, itching, dry skin Psych: Denies depression, anxiety, memory loss, confusion. No homicidal or suicidal ideation.  Heme: Denies bruising, bleeding, and enlarged lymph nodes.  Physical Exam   BP 127/84 (BP Location: Right Arm, Patient Position: Sitting, Cuff Size: Large)   Pulse 83   Temp 98 F (36.7 C) (Temporal)   Ht 5' 1 (1.549 m)   Wt 155 lb (70.3 kg)   LMP 05/19/2016   BMI 29.29 kg/m   General:   Alert and oriented. No distress noted. Pleasant and cooperative.  Head:  Normocephalic and atraumatic. Eyes:  Conjuctiva clear without scleral icterus. Mouth:  Oral mucosa pink and moist. Good dentition. No lesions. Lungs:  Clear to auscultation bilaterally. No wheezes, rales, or rhonchi. No distress.  Heart:  S1, S2 present without murmurs appreciated.  Abdomen:  +BS, soft, non-tender and non-distended. No rebound or guarding. No HSM or masses noted. Rectal: deferred Msk:  Symmetrical without gross deformities. Normal posture. Extremities:  Without edema. Evidence of prior LE wounds.  Neurologic:  Alert and  oriented x4 Psych:  Alert and cooperative. Normal mood and affect.  Assessment  [name] is a 58 y.o. female presenting today for follow up.   Transaminitis, Elevated alk phos: Ultrasound in March with concerns for sludge within the gallbladder without shadowing stones or acute cholecystitis.  She did have evidence of hepatic steatosis.  Follow-up Doppler with patent portal and hepatic veins without thrombosis.  Her LFTs during her prior hospitalization were significantly elevated in the thousands however given most recent labs outpatient have almost normalized completely.  Prior so logic workup has been negative for ASMA, AMA, ANA, IgG and normal iron panel.  Viral hepatitis C & B, CMV, EBV, and HSV also  negative.  GGT normal recent labs.  Elf indicated moderate risk of fibrosis given it was 10.6 although FibroSure indicated F0 fibrosis.  FIB-4: 1.28, upper limit of normal.  We discussed the meaning of these results today.  Will reassess labs in 6 months and recheck fib 4 and get elastography to monitor fibrosis.  Can determine at that point if she would be a good candidate for Rezdiffra.  We discussed the importance of her or the facility letting me know if she were to develop any increased weight around her abdomen, jaundice, pruritus, or significant mental status changes.  History of esophagitis and PUD: Prior EGD in 2022 with history of duodenal ulcers and  esophagitis.  No current reflux symptoms.  Did have some mild nausea with some vomiting possibly secondary to [employer].  No prior episodes to this since last visit.  May continue to remain off PPI.  Constipation: Reports constipation has resolved.  Denies any straining or difficulty with hard stools.  No overflow diarrhea.  No melena or BRBPR.  PLAN   Mediterranean diet/ low fat CMP, INR in 6 months to monitor FIB-4 score US  elastography in 6 months with labs Monitor for HE Follow up 1 year    Charmaine Melia, MSN, FNP-BC, AGACNP-BC [facility]

## 2024-05-09 ENCOUNTER — Encounter: Payer: Self-pay | Admitting: Gastroenterology

## 2024-05-09 ENCOUNTER — Ambulatory Visit (INDEPENDENT_AMBULATORY_CARE_PROVIDER_SITE_OTHER): Admitting: Gastroenterology

## 2024-05-09 VITALS — BP 127/84 | HR 83 | Temp 98.0°F | Ht 61.0 in | Wt 155.0 lb

## 2024-05-09 DIAGNOSIS — K838 Other specified diseases of biliary tract: Secondary | ICD-10-CM | POA: Diagnosis not present

## 2024-05-09 DIAGNOSIS — K5900 Constipation, unspecified: Secondary | ICD-10-CM

## 2024-05-09 DIAGNOSIS — R269 Unspecified abnormalities of gait and mobility: Secondary | ICD-10-CM | POA: Diagnosis not present

## 2024-05-09 DIAGNOSIS — M48061 Spinal stenosis, lumbar region without neurogenic claudication: Secondary | ICD-10-CM | POA: Diagnosis not present

## 2024-05-09 DIAGNOSIS — I130 Hypertensive heart and chronic kidney disease with heart failure and stage 1 through stage 4 chronic kidney disease, or unspecified chronic kidney disease: Secondary | ICD-10-CM | POA: Diagnosis not present

## 2024-05-09 DIAGNOSIS — R748 Abnormal levels of other serum enzymes: Secondary | ICD-10-CM

## 2024-05-09 DIAGNOSIS — E1151 Type 2 diabetes mellitus with diabetic peripheral angiopathy without gangrene: Secondary | ICD-10-CM | POA: Diagnosis not present

## 2024-05-09 DIAGNOSIS — J9611 Chronic respiratory failure with hypoxia: Secondary | ICD-10-CM | POA: Diagnosis not present

## 2024-05-09 DIAGNOSIS — E46 Unspecified protein-calorie malnutrition: Secondary | ICD-10-CM | POA: Diagnosis not present

## 2024-05-09 DIAGNOSIS — K760 Fatty (change of) liver, not elsewhere classified: Secondary | ICD-10-CM | POA: Diagnosis not present

## 2024-05-09 DIAGNOSIS — R7401 Elevation of levels of liver transaminase levels: Secondary | ICD-10-CM | POA: Diagnosis not present

## 2024-05-09 DIAGNOSIS — D631 Anemia in chronic kidney disease: Secondary | ICD-10-CM | POA: Diagnosis not present

## 2024-05-09 DIAGNOSIS — E1122 Type 2 diabetes mellitus with diabetic chronic kidney disease: Secondary | ICD-10-CM | POA: Diagnosis not present

## 2024-05-09 DIAGNOSIS — I5042 Chronic combined systolic (congestive) and diastolic (congestive) heart failure: Secondary | ICD-10-CM | POA: Diagnosis not present

## 2024-05-09 DIAGNOSIS — R112 Nausea with vomiting, unspecified: Secondary | ICD-10-CM

## 2024-05-09 DIAGNOSIS — G894 Chronic pain syndrome: Secondary | ICD-10-CM | POA: Diagnosis not present

## 2024-05-09 DIAGNOSIS — K219 Gastro-esophageal reflux disease without esophagitis: Secondary | ICD-10-CM | POA: Diagnosis not present

## 2024-05-09 DIAGNOSIS — R296 Repeated falls: Secondary | ICD-10-CM | POA: Diagnosis not present

## 2024-05-09 DIAGNOSIS — R7989 Other specified abnormal findings of blood chemistry: Secondary | ICD-10-CM

## 2024-05-09 DIAGNOSIS — I700 Atherosclerosis of aorta: Secondary | ICD-10-CM | POA: Diagnosis not present

## 2024-05-09 DIAGNOSIS — N184 Chronic kidney disease, stage 4 (severe): Secondary | ICD-10-CM | POA: Diagnosis not present

## 2024-05-09 DIAGNOSIS — E785 Hyperlipidemia, unspecified: Secondary | ICD-10-CM | POA: Diagnosis not present

## 2024-05-09 DIAGNOSIS — K829 Disease of gallbladder, unspecified: Secondary | ICD-10-CM | POA: Diagnosis not present

## 2024-05-09 DIAGNOSIS — Z8719 Personal history of other diseases of the digestive system: Secondary | ICD-10-CM

## 2024-05-09 DIAGNOSIS — J449 Chronic obstructive pulmonary disease, unspecified: Secondary | ICD-10-CM | POA: Diagnosis not present

## 2024-05-09 DIAGNOSIS — G4700 Insomnia, unspecified: Secondary | ICD-10-CM | POA: Diagnosis not present

## 2024-05-09 DIAGNOSIS — Z8711 Personal history of peptic ulcer disease: Secondary | ICD-10-CM | POA: Diagnosis not present

## 2024-05-09 DIAGNOSIS — M6281 Muscle weakness (generalized): Secondary | ICD-10-CM | POA: Diagnosis not present

## 2024-05-09 DIAGNOSIS — I480 Paroxysmal atrial fibrillation: Secondary | ICD-10-CM | POA: Diagnosis not present

## 2024-05-09 NOTE — Patient Instructions (Addendum)
 Continue to follow low fat diet.   We will plan to get labs and a special ultrasound of your liver in 6 months.  Need to monitor for jaundice, mental status changes, and progressive abdominal pain.  Please also notify if large amount of weight loss or weight gain or increased abdominal girth.  Encouraged to discuss with facility provider regarding her anxiety.  If no acute issues arise, we will plan to follow-up in 1 year or sooner if needed.  Our office will send reminder when labs are due and reach out to schedule ultrasound when due.  It was a pleasure to see you today. I want to create trusting relationships with patients. If you receive a survey regarding your visit,  I greatly appreciate you taking time to fill this out on paper or through your MyChart. I value your feedback.  Charmaine Melia, MSN, FNP-BC, AGACNP-BC [facility]

## 2024-05-11 DIAGNOSIS — N184 Chronic kidney disease, stage 4 (severe): Secondary | ICD-10-CM | POA: Diagnosis not present

## 2024-05-11 DIAGNOSIS — E785 Hyperlipidemia, unspecified: Secondary | ICD-10-CM | POA: Diagnosis not present

## 2024-05-11 DIAGNOSIS — I10 Essential (primary) hypertension: Secondary | ICD-10-CM | POA: Diagnosis not present

## 2024-05-11 DIAGNOSIS — E1100 Type 2 diabetes mellitus with hyperosmolarity without nonketotic hyperglycemic-hyperosmolar coma (NKHHC): Secondary | ICD-10-CM | POA: Diagnosis not present

## 2024-05-11 DIAGNOSIS — D519 Vitamin B12 deficiency anemia, unspecified: Secondary | ICD-10-CM | POA: Diagnosis not present

## 2024-05-17 DIAGNOSIS — G4733 Obstructive sleep apnea (adult) (pediatric): Secondary | ICD-10-CM | POA: Diagnosis not present

## 2024-05-17 DIAGNOSIS — N2581 Secondary hyperparathyroidism of renal origin: Secondary | ICD-10-CM | POA: Diagnosis not present

## 2024-05-17 DIAGNOSIS — N185 Chronic kidney disease, stage 5: Secondary | ICD-10-CM | POA: Diagnosis not present

## 2024-05-17 DIAGNOSIS — E875 Hyperkalemia: Secondary | ICD-10-CM | POA: Diagnosis not present

## 2024-05-23 DIAGNOSIS — E785 Hyperlipidemia, unspecified: Secondary | ICD-10-CM | POA: Diagnosis not present

## 2024-05-23 DIAGNOSIS — I10 Essential (primary) hypertension: Secondary | ICD-10-CM | POA: Diagnosis not present

## 2024-05-23 DIAGNOSIS — R296 Repeated falls: Secondary | ICD-10-CM | POA: Diagnosis not present

## 2024-05-23 DIAGNOSIS — E7841 Elevated Lipoprotein(a): Secondary | ICD-10-CM | POA: Diagnosis not present

## 2024-05-23 DIAGNOSIS — M48061 Spinal stenosis, lumbar region without neurogenic claudication: Secondary | ICD-10-CM | POA: Diagnosis not present

## 2024-05-23 DIAGNOSIS — M6281 Muscle weakness (generalized): Secondary | ICD-10-CM | POA: Diagnosis not present

## 2024-05-23 DIAGNOSIS — I130 Hypertensive heart and chronic kidney disease with heart failure and stage 1 through stage 4 chronic kidney disease, or unspecified chronic kidney disease: Secondary | ICD-10-CM | POA: Diagnosis not present

## 2024-05-23 DIAGNOSIS — R2689 Other abnormalities of gait and mobility: Secondary | ICD-10-CM | POA: Diagnosis not present

## 2024-05-23 DIAGNOSIS — K77 Liver disorders in diseases classified elsewhere: Secondary | ICD-10-CM | POA: Diagnosis not present

## 2024-05-23 DIAGNOSIS — D631 Anemia in chronic kidney disease: Secondary | ICD-10-CM | POA: Diagnosis not present

## 2024-05-23 DIAGNOSIS — K829 Disease of gallbladder, unspecified: Secondary | ICD-10-CM | POA: Diagnosis not present

## 2024-05-23 DIAGNOSIS — I739 Peripheral vascular disease, unspecified: Secondary | ICD-10-CM | POA: Diagnosis not present

## 2024-05-23 DIAGNOSIS — I480 Paroxysmal atrial fibrillation: Secondary | ICD-10-CM | POA: Diagnosis not present

## 2024-05-23 DIAGNOSIS — L605 Yellow nail syndrome: Secondary | ICD-10-CM | POA: Diagnosis not present

## 2024-05-23 DIAGNOSIS — K219 Gastro-esophageal reflux disease without esophagitis: Secondary | ICD-10-CM | POA: Diagnosis not present

## 2024-05-23 DIAGNOSIS — E46 Unspecified protein-calorie malnutrition: Secondary | ICD-10-CM | POA: Diagnosis not present

## 2024-05-23 DIAGNOSIS — J9611 Chronic respiratory failure with hypoxia: Secondary | ICD-10-CM | POA: Diagnosis not present

## 2024-05-23 DIAGNOSIS — M79674 Pain in right toe(s): Secondary | ICD-10-CM | POA: Diagnosis not present

## 2024-05-23 DIAGNOSIS — E1122 Type 2 diabetes mellitus with diabetic chronic kidney disease: Secondary | ICD-10-CM | POA: Diagnosis not present

## 2024-05-23 DIAGNOSIS — E1151 Type 2 diabetes mellitus with diabetic peripheral angiopathy without gangrene: Secondary | ICD-10-CM | POA: Diagnosis not present

## 2024-05-23 DIAGNOSIS — K279 Peptic ulcer, site unspecified, unspecified as acute or chronic, without hemorrhage or perforation: Secondary | ICD-10-CM | POA: Diagnosis not present

## 2024-05-23 DIAGNOSIS — I5041 Acute combined systolic (congestive) and diastolic (congestive) heart failure: Secondary | ICD-10-CM | POA: Diagnosis not present

## 2024-05-23 DIAGNOSIS — B351 Tinea unguium: Secondary | ICD-10-CM | POA: Diagnosis not present

## 2024-05-23 DIAGNOSIS — I5042 Chronic combined systolic (congestive) and diastolic (congestive) heart failure: Secondary | ICD-10-CM | POA: Diagnosis not present

## 2024-05-23 DIAGNOSIS — R269 Unspecified abnormalities of gait and mobility: Secondary | ICD-10-CM | POA: Diagnosis not present

## 2024-05-23 DIAGNOSIS — I700 Atherosclerosis of aorta: Secondary | ICD-10-CM | POA: Diagnosis not present

## 2024-05-23 DIAGNOSIS — G894 Chronic pain syndrome: Secondary | ICD-10-CM | POA: Diagnosis not present

## 2024-05-23 DIAGNOSIS — G4700 Insomnia, unspecified: Secondary | ICD-10-CM | POA: Diagnosis not present

## 2024-05-23 DIAGNOSIS — M79675 Pain in left toe(s): Secondary | ICD-10-CM | POA: Diagnosis not present

## 2024-05-23 DIAGNOSIS — J449 Chronic obstructive pulmonary disease, unspecified: Secondary | ICD-10-CM | POA: Diagnosis not present

## 2024-05-23 DIAGNOSIS — K838 Other specified diseases of biliary tract: Secondary | ICD-10-CM | POA: Diagnosis not present

## 2024-05-23 DIAGNOSIS — N184 Chronic kidney disease, stage 4 (severe): Secondary | ICD-10-CM | POA: Diagnosis not present

## 2024-05-24 ENCOUNTER — Other Ambulatory Visit (HOSPITAL_COMMUNITY): Payer: Self-pay | Admitting: Family Medicine

## 2024-05-24 ENCOUNTER — Ambulatory Visit: Payer: Self-pay | Admitting: Primary Care

## 2024-05-24 DIAGNOSIS — Z1231 Encounter for screening mammogram for malignant neoplasm of breast: Secondary | ICD-10-CM

## 2024-05-24 DIAGNOSIS — G4733 Obstructive sleep apnea (adult) (pediatric): Secondary | ICD-10-CM

## 2024-05-25 NOTE — Progress Notes (Signed)
 I called and spoke to Luke who is a NT at [facility]. Luke was informed of Beth's note and verbalized understanding. CPAP ordered. NFN.

## 2024-06-08 ENCOUNTER — Ambulatory Visit: Attending: Internal Medicine | Admitting: Internal Medicine

## 2024-06-08 VITALS — BP 126/80 | HR 73 | Ht 61.0 in | Wt 170.0 lb

## 2024-06-08 DIAGNOSIS — I739 Peripheral vascular disease, unspecified: Secondary | ICD-10-CM | POA: Diagnosis not present

## 2024-06-08 DIAGNOSIS — I10 Essential (primary) hypertension: Secondary | ICD-10-CM | POA: Diagnosis not present

## 2024-06-08 DIAGNOSIS — I5032 Chronic diastolic (congestive) heart failure: Secondary | ICD-10-CM | POA: Insufficient documentation

## 2024-06-08 DIAGNOSIS — G4733 Obstructive sleep apnea (adult) (pediatric): Secondary | ICD-10-CM | POA: Diagnosis not present

## 2024-06-08 NOTE — Patient Instructions (Signed)
Medication Instructions:  Your physician recommends that you continue on your current medications as directed. Please refer to the Current Medication list given to you today.   Labwork: None today  Testing/Procedures: None today  Follow-Up: 1 year Dr.Mallipeddi  Any Other Special Instructions Will Be Listed Below (If Applicable).  If you need a refill on your cardiac medications before your next appointment, please call your pharmacy.

## 2024-06-08 NOTE — Progress Notes (Signed)
 4

## 2024-06-08 NOTE — Progress Notes (Signed)
 Cardiology Office Note  Date: 06/08/2024   ID: [name], DOB 1966/10/09, MRN [id number]  PCP:  Barbaraann Harvey, FNP  Cardiologist:  Vina Gull, MD Electrophysiologist:  None   History of Present Illness: [name] is a 58 y.o. female  HFimpEF (30 to 35% in 2018 that improved to normal LVEF eventually) and LHC in 2018 showed angiographically normal coronaries, mildly elevated LVEDP.  Tortuous right subclavian artery requiring Glidewire, would not use right radial approach if cath was needed in the future.  Patient wheelchair-bound and now currently using physical therapy sessions to get some strength.  She does not have any symptoms of angina or DOE.  No leg swelling.  No orthopnea, PND.  No dizziness, syncope or palpitations either.  Doing well overall.  She had 1 episode of nosebleeding around 1 week ago and still using Eliquis  for paroxysmal atrial fibrillation.  She also has OSA for which she will need CPAP titration.  Patient was admitted in March 2025 at [facility] with decreased p.o. intake, LFTs were noted to be elevated but upon discharge LFTs were noted to be trending down.  She currently follows up with GI.  She follows with nephrology for CKD for management.  She says they are thinking about patient/graft.  She also had a history of polysubstance abuse, cocaine use in the past with recent hospitalization did not reveal any urine toxicology positive for cocaine.  Past Medical History:  Diagnosis Date   Acute lower UTI 06/04/2018   Acute metabolic encephalopathy 03/11/2021   Acute on chronic respiratory failure with hypoxia (HCC) 07/20/2021   Acute respiratory failure requiring reintubation (HCC) 11/09/2012   Acute respiratory failure with hypoxia (HCC) 12/03/2020   AKI (acute kidney injury) (HCC) 06/04/2018   Alcohol  use    Ankle fracture, lateral malleolus, closed 2013   Anxiety    Aortic atherosclerosis (HCC) 03/10/2021   Atherosclerosis of native artery of both  legs with gangrene (HCC)    Breast mass, left 2013   CHF (congestive heart failure) (HCC)    a. EF 20-25% by echo in 05/2016 with cath showing normal cors b. EF 50-55% in 07/2020 c. 01/2021: EF at 55-60% with moderate LVH   Chronic anemia    CKD (chronic kidney disease), stage III (HCC)    Cocaine abuse (HCC)    COPD (chronic obstructive pulmonary disease) (HCC)    Diabetes mellitus, type 2 (HCC)    Diabetic Charcot foot (HCC)    DKA (diabetic ketoacidosis) (HCC) 10/29/2020   Essential hypertension    H/O open hand wound 11/22/2018   History of cardiomyopathy    History of GI bleed 2011   Hyperlipidemia    Lactic acid acidosis 09/11/2014   Metabolic encephalopathy 03/11/2021   Noncompliance    Obesity    Oral candidiasis 03/10/2021   PAF (paroxysmal atrial fibrillation) (HCC)    Panic attacks    PAT (paroxysmal atrial tachycardia) (HCC)    Previously on Amiodarone    Pneumonia due to COVID-19 virus 12/03/2020   Seizures (HCC) 10/01/2020   Sepsis due to skin infection (HCC) 04/01/2021   Shock (HCC) 10/29/2020   Sleep apnea    Not on CPAP   Stroke (HCC) 2018   Thrombocytosis 08/02/2017   Tobacco abuse    UGI bleed    Urinary incontinence     Past Surgical History:  Procedure Laterality Date   ANGIOPLASTY ILLIAC ARTERY Right 04/23/2021   Procedure: ANGIOPLASTY AND STENT SUPERFICIAL FEMORAL ARTERY;  Surgeon: Magda Ned  N, MD;  [facility];  Service: Vascular;  Laterality: Right;   AORTOGRAM  04/23/2021   Procedure: AORTOGRAM;  Surgeon: Magda Debby SAILOR, MD;  Location: [facility] OR;  Service: Vascular;;   AORTOGRAM Left 04/30/2021   Procedure: left lower extremity angiogram with second order cannulation;  Surgeon: Magda Debby SAILOR, MD;  Location: [facility] OR;  Service: Vascular;  Laterality: Left;   BIOPSY  11/12/2020   Procedure: BIOPSY;  Surgeon: Eartha Angelia Sieving, MD;  [facility];  Service: Gastroenterology;;   BREAST BIOPSY     CARDIAC CATHETERIZATION N/A  07/28/2016   Procedure: Left Heart Cath and Coronary Angiography;  Surgeon: Candyce GORMAN Reek, MD;  Location: [facility] INVASIVE CV LAB;  Service: Cardiovascular;  Laterality: N/A;   COLONOSCOPY N/A 05/10/2019   Procedure: COLONOSCOPY;  Surgeon: Harvey Margo CROME, MD;  [facility];  Service: Endoscopy;  Laterality: N/A;  Phenergan  12.5 mg IV in pre-op    DILATION AND CURETTAGE OF UTERUS     ESOPHAGOGASTRODUODENOSCOPY (EGD) WITH PROPOFOL  N/A 11/12/2020   Procedure: ESOPHAGOGASTRODUODENOSCOPY (EGD) WITH PROPOFOL ;  Surgeon: Eartha Angelia Sieving, MD;  [facility];  Service: Gastroenterology;  Laterality: N/A;   I & D EXTREMITY Bilateral 09/22/2017   Procedure: BILATERAL DEBRIDEMENT LEG/FOOT ULCERS, APPLY VERAFLO WOUND VAC;  Surgeon: Harden Jerona GAILS, MD;  Location: [facility] OR;  Service: Orthopedics;  Laterality: Bilateral;   I & D EXTREMITY Right 10/11/2018   Procedure: IRRIGATION AND DEBRIDEMENT RIGHT HAND;  Surgeon: Camella Fallow, MD;  [facility];  Service: Orthopedics;  Laterality: Right;   I & D EXTREMITY Right 10/13/2018   Procedure: REPEAT IRRIGATION AND DEBRIDEMENT RIGHT HAND;  Surgeon: Camella Fallow, MD;  [facility];  Service: Orthopedics;  Laterality: Right;   I & D EXTREMITY Right 11/22/2018   Procedure: IRRIGATION AND DEBRIDEMENT AND PINNING RIGHT HAND;  Surgeon: Camella Fallow, MD;  [facility];  Service: Orthopedics;  Laterality: Right;   IR RADIOLOGIST EVAL & MGMT  07/05/2018   LOWER EXTREMITY ANGIOGRAM Bilateral 04/23/2021   Procedure: RIGHT  AND LEFT LOWER EXTREMITY ANGIOGRAM;  Surgeon: Magda Debby SAILOR, MD;  Location: [facility] OR;  Service: Vascular;  Laterality: Bilateral;   POLYPECTOMY  05/10/2019   Procedure: POLYPECTOMY;  Surgeon: Harvey Margo CROME, MD;  [facility];  Service: Endoscopy;;   SKIN SPLIT GRAFT Bilateral 09/28/2017   Procedure: BILATERAL SPLIT THICKNESS SKIN GRAFT LEGS/FEET AND APPLY VAC;  Surgeon: Harden Jerona GAILS, MD;  [facility];   Service: Orthopedics;  Laterality: Bilateral;   SKIN SPLIT GRAFT Right 11/22/2018   Procedure: SKIN GRAFT SPLIT THICKNESS;  Surgeon: Camella Fallow, MD;  [facility] OR;  Service: Orthopedics;  Laterality: Right;    Current Outpatient Medications  Medication Sig Dispense Refill   acetaminophen  (TYLENOL ) 325 MG tablet Take 2 tablets (650 mg total) by mouth every 6 (six) hours as needed for headache, fever or mild pain.     amLODipine  (NORVASC ) 5 MG tablet Take 1 tablet (5 mg total) by mouth daily. 30 tablet 1   ammonium lactate  (AMLACTIN) 12 % lotion Apply 1 Application topically as needed for dry skin. 400 g 4   apixaban  (ELIQUIS ) 5 MG TABS tablet Take 1 tablet (5 mg total) by mouth 2 (two) times daily. 60 tablet 0   ARIPiprazole  (ABILIFY ) 5 MG tablet Take 1 tablet (5 mg total) by mouth daily. 60 tablet 0   azelastine  (OPTIVAR ) 0.05 % ophthalmic solution Place 1 drop into both eyes 2 (  two) times daily.     calcitRIOL  (ROCALTROL ) 0.25 MCG capsule Take 1 capsule (0.25 mcg total) by mouth daily. 30 capsule 0   FLUoxetine  (PROZAC ) 40 MG capsule Take 2 pills once a day 60 capsule 3   fluticasone  (FLONASE ) 50 MCG/ACT nasal spray Place 2 sprays into both nostrils daily. 16 g 6   loperamide  (IMODIUM ) 2 MG capsule Take 4 mg by mouth every 4 (four) hours as needed for diarrhea or loose stools.     metoprolol  succinate (TOPROL -XL) 25 MG 24 hr tablet Take 1 tablet (25 mg total) by mouth daily. 30 tablet 0   ondansetron  (ZOFRAN -ODT) 4 MG disintegrating tablet Take 4 mg by mouth every 8 (eight) hours as needed for nausea or vomiting.     oxybutynin  (DITROPAN -XL) 5 MG 24 hr tablet Take 1 tablet (5 mg total) by mouth at bedtime.     polyethylene glycol (MIRALAX  / GLYCOLAX ) 17 g packet Take 17 g by mouth daily. 30 each 0   thiamine  (VITAMIN B1) 100 MG tablet TAKE (1) TABLET BY MOUTH ONCE DAILY. 30 tablet 0   torsemide  (DEMADEX ) 20 MG tablet Take 1 tablet (20 mg total) by mouth daily. 30 tablet 0   olopatadine   (PATADAY ) 0.1 % ophthalmic solution Place 1 drop into both eyes 2 (two) times daily. (Patient not taking: Reported on 06/08/2024)     No current facility-administered medications for this visit.   Allergies:  Sodium hypochlorite   Social History: The patient  reports that she quit smoking about 7 years ago. Her smoking use included cigarettes. She started smoking about 27 years ago. She has a 5 pack-year smoking history. She has never been exposed to tobacco smoke. She has never used smokeless tobacco. She reports that she does not currently use alcohol . She reports that she does not currently use drugs after having used the following drugs: Marijuana and Cocaine. Frequency: 3.00 times per week.   Family History: The patient's family history includes Arthritis in an other family member; Asthma in an other family member; Cancer in her sister and another family member; Diabetes in an other family member; Heart attack in her mother; Hypertension in her brother, daughter, father, mother, sister, sister, and son.   ROS:  Please see the history of present illness. Otherwise, complete review of systems is positive for none.  All other systems are reviewed and negative.   Physical Exam: VS:  BP 126/80 (BP Location: Right Arm, Patient Position: Sitting, Cuff Size: Normal)   Pulse 73   Ht 5' 1 (1.549 m)   Wt 170 lb (77.1 kg)   LMP 05/19/2016   SpO2 (!) 85%   BMI 32.12 kg/m , BMI Body mass index is 32.12 kg/m.  Wt Readings from Last 3 Encounters:  06/08/24 170 lb (77.1 kg)  05/09/24 155 lb (70.3 kg)  02/10/24 158 lb (71.7 kg)    General: Patient appears comfortable at rest. HEENT: Conjunctiva and lids normal, oropharynx clear with moist mucosa. Neck: Supple, no elevated JVP or carotid bruits, no thyromegaly. Lungs: Clear to auscultation, nonlabored breathing at rest. Cardiac: Regular rate and rhythm, no S3 or significant systolic murmur, no pericardial rub. Abdomen: Soft, nontender, no  hepatomegaly, bowel sounds present, no guarding or rebound. Extremities: No pitting edema, distal pulses 2+. Skin: Warm and dry. Musculoskeletal: No kyphosis. Neuropsychiatric: Alert and oriented x3, affect grossly appropriate.  Recent Labwork: 01/07/2024: Hemoglobin 9.2; Magnesium  1.8; Platelets 190 04/26/2024: ALT 53; AST 30; BUN 60; Creatinine, Ser 3.93; Potassium 4.8;  Sodium 142     Component Value Date/Time   CHOL 263 (H) 04/26/2024 1415   TRIG 193 (H) 04/26/2024 1415   HDL 60 03/31/2023 0828   CHOLHDL 3.1 03/31/2023 0828   CHOLHDL 7.1 08/23/2020 0248   VLDL 26 08/23/2020 0248   LDLCALC 108 (H) 03/31/2023 0828   LDLCALC 73 12/07/2018 1543   LDLDIRECT 122 (H) 01/08/2010 0818     Assessment and Plan:  HFimpEF - Compensated, no interval angina or DOE. - Echocardiogram from 2018 showed LVEF 30 to 35% that improved to normal eventually.  LHC showed angiographically normal coronaries. - Continue metoprolol  succinate 25 mg once daily. - Not on ACE/ARB/ARNI/SGLT2 inhibitor/MRA due to CKD stage IV.  Paroxysmal atrial fibrillation - Does not recall when she was diagnosed with A-fib for the first time. - Continue metoprolol  succinate 25 mg once daily. - Continue Eliquis  5 mg twice daily. - She had 1 episode of nosebleeding 1 week ago.  If she develops frequent nosebleeds, need to hold Eliquis  until ENT clears.  Acute hypoxic respiratory failure - Does not have any symptoms of DOE, orthopnea, PND or leg swelling however since her today is not picking up the oxygen  saturation from her nailbeds due to long nails.  She will need to have this rechecked in the facility.  OSA not on CPAP - She was diagnosed with OSA, awaiting CPAP.  I spent 40 minutes reviewing the prior records, imaging, more than 3 labs, test, discussion of HFimpEF, proximal atrial fibrillation, OSA, hypoxia and documentation.   Medication Adjustments/Labs and Tests Ordered: Current medicines are reviewed at length  with the patient today.  Concerns regarding medicines are outlined above.    Disposition:  Follow up 1 year  Signed, Joyous Gleghorn Arleta Maywood, MD, 06/08/2024 11:01 AM    [name] [address], [place], [place] [address]

## 2024-06-28 ENCOUNTER — Encounter (HOSPITAL_COMMUNITY): Payer: Self-pay

## 2024-06-28 ENCOUNTER — Ambulatory Visit (HOSPITAL_COMMUNITY)
Admission: RE | Admit: 2024-06-28 | Discharge: 2024-06-28 | Disposition: A | Discharge status: Home / Self Care | Source: Ambulatory Visit | Attending: Family Medicine | Admitting: Family Medicine

## 2024-06-28 DIAGNOSIS — Z1231 Encounter for screening mammogram for malignant neoplasm of breast: Secondary | ICD-10-CM | POA: Insufficient documentation

## 2024-07-06 DIAGNOSIS — J302 Other seasonal allergic rhinitis: Secondary | ICD-10-CM | POA: Diagnosis not present

## 2024-07-06 DIAGNOSIS — E785 Hyperlipidemia, unspecified: Secondary | ICD-10-CM | POA: Diagnosis not present

## 2024-07-06 DIAGNOSIS — R040 Epistaxis: Secondary | ICD-10-CM | POA: Diagnosis not present

## 2024-07-06 DIAGNOSIS — I739 Peripheral vascular disease, unspecified: Secondary | ICD-10-CM | POA: Diagnosis not present

## 2024-07-06 DIAGNOSIS — I2584 Coronary atherosclerosis due to calcified coronary lesion: Secondary | ICD-10-CM | POA: Diagnosis not present

## 2024-07-06 DIAGNOSIS — I5032 Chronic diastolic (congestive) heart failure: Secondary | ICD-10-CM | POA: Diagnosis not present

## 2024-07-06 DIAGNOSIS — G4730 Sleep apnea, unspecified: Secondary | ICD-10-CM | POA: Diagnosis not present

## 2024-07-06 DIAGNOSIS — I10 Essential (primary) hypertension: Secondary | ICD-10-CM | POA: Diagnosis not present

## 2024-07-07 DIAGNOSIS — N189 Chronic kidney disease, unspecified: Secondary | ICD-10-CM | POA: Diagnosis not present

## 2024-07-07 DIAGNOSIS — D631 Anemia in chronic kidney disease: Secondary | ICD-10-CM | POA: Diagnosis not present

## 2024-07-07 DIAGNOSIS — E211 Secondary hyperparathyroidism, not elsewhere classified: Secondary | ICD-10-CM | POA: Diagnosis not present

## 2024-07-07 DIAGNOSIS — R809 Proteinuria, unspecified: Secondary | ICD-10-CM | POA: Diagnosis not present

## 2024-07-07 DIAGNOSIS — N185 Chronic kidney disease, stage 5: Secondary | ICD-10-CM | POA: Diagnosis not present

## 2024-07-14 DIAGNOSIS — N049 Nephrotic syndrome with unspecified morphologic changes: Secondary | ICD-10-CM | POA: Diagnosis not present

## 2024-07-14 DIAGNOSIS — N185 Chronic kidney disease, stage 5: Secondary | ICD-10-CM | POA: Diagnosis not present

## 2024-07-14 DIAGNOSIS — G4733 Obstructive sleep apnea (adult) (pediatric): Secondary | ICD-10-CM | POA: Diagnosis not present

## 2024-07-14 DIAGNOSIS — E875 Hyperkalemia: Secondary | ICD-10-CM | POA: Diagnosis not present

## 2024-07-18 DIAGNOSIS — Z961 Presence of intraocular lens: Secondary | ICD-10-CM | POA: Diagnosis not present

## 2024-07-19 ENCOUNTER — Ambulatory Visit: Admitting: Internal Medicine

## 2024-07-20 DIAGNOSIS — E785 Hyperlipidemia, unspecified: Secondary | ICD-10-CM | POA: Diagnosis not present

## 2024-07-20 DIAGNOSIS — I10 Essential (primary) hypertension: Secondary | ICD-10-CM | POA: Diagnosis not present

## 2024-07-20 DIAGNOSIS — J9611 Chronic respiratory failure with hypoxia: Secondary | ICD-10-CM | POA: Diagnosis not present

## 2024-07-20 DIAGNOSIS — D519 Vitamin B12 deficiency anemia, unspecified: Secondary | ICD-10-CM | POA: Diagnosis not present

## 2024-07-20 DIAGNOSIS — I6381 Other cerebral infarction due to occlusion or stenosis of small artery: Secondary | ICD-10-CM | POA: Diagnosis not present

## 2024-07-20 DIAGNOSIS — I120 Hypertensive chronic kidney disease with stage 5 chronic kidney disease or end stage renal disease: Secondary | ICD-10-CM | POA: Diagnosis not present

## 2024-07-20 DIAGNOSIS — M62521 Muscle wasting and atrophy, not elsewhere classified, right upper arm: Secondary | ICD-10-CM | POA: Diagnosis not present

## 2024-07-20 DIAGNOSIS — I70219 Atherosclerosis of native arteries of extremities with intermittent claudication, unspecified extremity: Secondary | ICD-10-CM | POA: Diagnosis not present

## 2024-07-20 DIAGNOSIS — E1100 Type 2 diabetes mellitus with hyperosmolarity without nonketotic hyperglycemic-hyperosmolar coma (NKHHC): Secondary | ICD-10-CM | POA: Diagnosis not present

## 2024-07-20 DIAGNOSIS — I6330 Cerebral infarction due to thrombosis of unspecified cerebral artery: Secondary | ICD-10-CM | POA: Diagnosis not present

## 2024-07-20 DIAGNOSIS — M48061 Spinal stenosis, lumbar region without neurogenic claudication: Secondary | ICD-10-CM | POA: Diagnosis not present

## 2024-08-17 ENCOUNTER — Encounter (INDEPENDENT_AMBULATORY_CARE_PROVIDER_SITE_OTHER): Payer: Self-pay

## 2024-08-17 ENCOUNTER — Ambulatory Visit (INDEPENDENT_AMBULATORY_CARE_PROVIDER_SITE_OTHER)

## 2024-08-17 VITALS — BP 123/94 | HR 98 | Temp 98.0°F | Ht 61.0 in | Wt 155.0 lb

## 2024-08-17 DIAGNOSIS — R040 Epistaxis: Secondary | ICD-10-CM

## 2024-08-17 MED ORDER — AYR SALINE NASAL NA GEL: 1.0000 | Freq: Two times a day (BID) | NASAL | 1 refills | Status: AC

## 2024-08-17 MED ORDER — SALINE SPRAY 0.65 % NA SOLN: 1.0000 | Freq: Every evening | NASAL | 2 refills | Status: AC

## 2024-08-17 NOTE — Progress Notes (Signed)
 HPI:   [name] is a 58 y.o. female who presents as a new patient being seen in consultation for recurrent epistaxis.  Patient states that the nosebleed started 2 to 3 weeks ago.  She states that every time she blows her nose she feels like she gets clots out of her nose.  She states that both sides of her nose tend to bleed.  She does state that she uses her fingers to get debris out of her nose.  Both hands are noted to have very long fingernails.  Discussed with patient that this is likely causing trauma to the inside of her nose.  Patient was very surprised upon hearing this.  States that she will no longer be doing this.  Patient also states that her living apartment is very dry and feels that her nose gets very dry.  She states that all the nosebleeds stopped after less than 15 minutes.  She has never needed nasal cautery.  Patient does not use any ointment or sprays in her nose.  PMH/Meds/All/SocHx/FamHx/ROS: Past Medical History:  Diagnosis Date   Acute lower UTI 06/04/2018   Acute metabolic encephalopathy 03/11/2021   Acute on chronic respiratory failure with hypoxia (HCC) 07/20/2021   Acute respiratory failure requiring reintubation (HCC) 11/09/2012   Acute respiratory failure with hypoxia (HCC) 12/03/2020   AKI (acute kidney injury) 06/04/2018   Alcohol  use    Ankle fracture, lateral malleolus, closed 2013   Anxiety    Aortic atherosclerosis 03/10/2021   Atherosclerosis of native artery of both legs with gangrene (HCC)    Breast mass, left 2013   CHF (congestive heart failure) (HCC)    a. EF 20-25% by echo in 05/2016 with cath showing normal cors b. EF 50-55% in 07/2020 c. 01/2021: EF at 55-60% with moderate LVH   Chronic anemia    CKD (chronic kidney disease), stage III (HCC)    Cocaine abuse (HCC)    COPD (chronic obstructive pulmonary disease) (HCC)    Diabetes mellitus, type 2 (HCC)    Diabetic Charcot foot (HCC)    DKA (diabetic ketoacidosis) (HCC) 10/29/2020    Essential hypertension    H/O open hand wound 11/22/2018   History of cardiomyopathy    History of GI bleed 2011   Hyperlipidemia    Lactic acid acidosis 09/11/2014   Metabolic encephalopathy 03/11/2021   Noncompliance    Obesity    Oral candidiasis 03/10/2021   PAF (paroxysmal atrial fibrillation) (HCC)    Panic attacks    PAT (paroxysmal atrial tachycardia)    Previously on Amiodarone    Pneumonia due to COVID-19 virus 12/03/2020   Seizures (HCC) 10/01/2020   Sepsis due to skin infection (HCC) 04/01/2021   Shock (HCC) 10/29/2020   Sleep apnea    Not on CPAP   Stroke (HCC) 2018   Thrombocytosis 08/02/2017   Tobacco abuse    UGI bleed    Urinary incontinence    Past Surgical History:  Procedure Laterality Date   ANGIOPLASTY ILLIAC ARTERY Right 04/23/2021   Procedure: ANGIOPLASTY AND STENT SUPERFICIAL FEMORAL ARTERY;  Surgeon: Magda Debby SAILOR, MD;  Location: [facility] OR;  Service: Vascular;  Laterality: Right;   AORTOGRAM  04/23/2021   Procedure: AORTOGRAM;  Surgeon: Magda Debby SAILOR, MD;  [facility];  Service: Vascular;;   AORTOGRAM Left 04/30/2021   Procedure: left lower extremity angiogram with second order cannulation;  Surgeon: Magda Debby SAILOR, MD;  Location: [facility] OR;  Service: Vascular;  Laterality: Left;  BIOPSY  11/12/2020   Procedure: BIOPSY;  Surgeon: Eartha Angelia Sieving, MD;  [facility];  Service: Gastroenterology;;   BREAST BIOPSY     CARDIAC CATHETERIZATION N/A 07/28/2016   Procedure: Left Heart Cath and Coronary Angiography;  Surgeon: Candyce GORMAN Reek, MD;  Location: [facility] INVASIVE CV LAB;  Service: Cardiovascular;  Laterality: N/A;   COLONOSCOPY N/A 05/10/2019   Procedure: COLONOSCOPY;  Surgeon: Harvey Margo CROME, MD;  [facility];  Service: Endoscopy;  Laterality: N/A;  Phenergan  12.5 mg IV in pre-op    DILATION AND CURETTAGE OF UTERUS     ESOPHAGOGASTRODUODENOSCOPY (EGD) WITH PROPOFOL  N/A 11/12/2020   Procedure: ESOPHAGOGASTRODUODENOSCOPY  (EGD) WITH PROPOFOL ;  Surgeon: Eartha Angelia Sieving, MD;  [facility];  Service: Gastroenterology;  Laterality: N/A;   I & D EXTREMITY Bilateral 09/22/2017   Procedure: BILATERAL DEBRIDEMENT LEG/FOOT ULCERS, APPLY VERAFLO WOUND VAC;  Surgeon: Harden Jerona GAILS, MD;  Location: [facility];  Service: Orthopedics;  Laterality: Bilateral;   I & D EXTREMITY Right 10/11/2018   Procedure: IRRIGATION AND DEBRIDEMENT RIGHT HAND;  Surgeon: Camella Fallow, MD;  [facility];  Service: Orthopedics;  Laterality: Right;   I & D EXTREMITY Right 10/13/2018   Procedure: REPEAT IRRIGATION AND DEBRIDEMENT RIGHT HAND;  Surgeon: Camella Fallow, MD;  [facility];  Service: Orthopedics;  Laterality: Right;   I & D EXTREMITY Right 11/22/2018   Procedure: IRRIGATION AND DEBRIDEMENT AND PINNING RIGHT HAND;  Surgeon: Camella Fallow, MD;  [facility];  Service: Orthopedics;  Laterality: Right;   IR RADIOLOGIST EVAL & MGMT  07/05/2018   LOWER EXTREMITY ANGIOGRAM Bilateral 04/23/2021   Procedure: RIGHT  AND LEFT LOWER EXTREMITY ANGIOGRAM;  Surgeon: Magda Debby SAILOR, MD;  Location: [facility] OR;  Service: Vascular;  Laterality: Bilateral;   POLYPECTOMY  05/10/2019   Procedure: POLYPECTOMY;  Surgeon: Harvey Margo CROME, MD;  [facility];  Service: Endoscopy;;   SKIN SPLIT GRAFT Bilateral 09/28/2017   Procedure: BILATERAL SPLIT THICKNESS SKIN GRAFT LEGS/FEET AND APPLY VAC;  Surgeon: Harden Jerona GAILS, MD;  [facility];  Service: Orthopedics;  Laterality: Bilateral;   SKIN SPLIT GRAFT Right 11/22/2018   Procedure: SKIN GRAFT SPLIT THICKNESS;  Surgeon: Camella Fallow, MD;  [facility];  Service: Orthopedics;  Laterality: Right;   No family history of bleeding disorders, wound healing problems or difficulty with anesthesia.  Social Connections: Moderately Isolated (10/15/2020)   Social Connection and Isolation Panel    Frequency of Communication with Friends and Family: More than three times a week     Frequency of Social Gatherings with Friends and Family: More than three times a week    Attends Religious Services: More than 4 times per year    Active Member of [employer] or Organizations: No    Attends Banker Meetings: Never    Marital Status: Widowed    Current Outpatient Medications:    acetaminophen  (TYLENOL ) 325 MG tablet, Take 2 tablets (650 mg total) by mouth every 6 (six) hours as needed for headache, fever or mild pain., Disp: , Rfl:    amLODipine  (NORVASC ) 5 MG tablet, Take 1 tablet (5 mg total) by mouth daily., Disp: 30 tablet, Rfl: 1   ammonium lactate  (AMLACTIN) 12 % lotion, Apply 1 Application topically as needed for dry skin., Disp: 400 g, Rfl: 4   apixaban  (ELIQUIS ) 5 MG TABS tablet, Take 1 tablet (5 mg total) by mouth 2 (two) times daily., Disp: 60 tablet, Rfl: 0  ARIPiprazole  (ABILIFY ) 5 MG tablet, Take 1 tablet (5 mg total) by mouth daily., Disp: 60 tablet, Rfl: 0   azelastine  (OPTIVAR ) 0.05 % ophthalmic solution, Place 1 drop into both eyes 2 (two) times daily., Disp: , Rfl:    calcitRIOL  (ROCALTROL ) 0.25 MCG capsule, Take 1 capsule (0.25 mcg total) by mouth daily., Disp: 30 capsule, Rfl: 0   FLUoxetine  (PROZAC ) 40 MG capsule, Take 2 pills once a day, Disp: 60 capsule, Rfl: 3   fluticasone  (FLONASE ) 50 MCG/ACT nasal spray, Place 2 sprays into both nostrils daily., Disp: 16 g, Rfl: 6   loperamide  (IMODIUM ) 2 MG capsule, Take 4 mg by mouth every 4 (four) hours as needed for diarrhea or loose stools., Disp: , Rfl:    metoprolol  succinate (TOPROL -XL) 25 MG 24 hr tablet, Take 1 tablet (25 mg total) by mouth daily., Disp: 30 tablet, Rfl: 0   ondansetron  (ZOFRAN -ODT) 4 MG disintegrating tablet, Take 4 mg by mouth every 8 (eight) hours as needed for nausea or vomiting., Disp: , Rfl:    oxybutynin  (DITROPAN -XL) 5 MG 24 hr tablet, Take 1 tablet (5 mg total) by mouth at bedtime., Disp: , Rfl:    polyethylene glycol (MIRALAX  / GLYCOLAX ) 17 g packet, Take 17 g by mouth  daily., Disp: 30 each, Rfl: 0   thiamine  (VITAMIN B1) 100 MG tablet, TAKE (1) TABLET BY MOUTH ONCE DAILY., Disp: 30 tablet, Rfl: 0   torsemide  (DEMADEX ) 20 MG tablet, Take 1 tablet (20 mg total) by mouth daily., Disp: 30 tablet, Rfl: 0   olopatadine  (PATADAY ) 0.1 % ophthalmic solution, Place 1 drop into both eyes 2 (two) times daily. (Patient not taking: Reported on 08/17/2024), Disp: , Rfl:  A complete ROS was performed with pertinent positives/negatives noted in the HPI. The remainder of the ROS are negative.   Physical Exam:  BP (!) 123/94 (BP Location: Right Arm, Patient Position: Sitting, Cuff Size: Normal)   Pulse 98   Temp 98 F (36.7 C)   Ht 5' 1 (1.549 m)   Wt 155 lb (70.3 kg)   LMP 05/19/2016   SpO2 93%   BMI 29.29 kg/m  General: Well developed, well nourished. No acute distress. Voice normal Head/Face: Normocephalic. No sinus tenderness. Facial nerve intact and equal bilaterally. No facial lacerations. Eyes: PERRL, no scleral icterus or conjunctival hemorrhage. EOMI. Ears: No gross deformity. Normal external canal. Tympanic membrane intact bilaterally Hearing: Normal speech reception.  Nose: No gross deformity or lesions. No purulent discharge. No turbinate hypertrophy. Mouth/Oropharynx: Lips without any lesions. Dentition good.  Upper dentures in place.  No mucosal lesions within the oropharynx. No tonsillar enlargement, exudate, or lesions. Pharyngeal walls symmetrical. Uvula midline. Tongue midline without lesions. Larynx: See TFL if applicable Nasopharynx: See TFL if applicable Neck: Trachea midline. No masses. No thyromegaly or nodules palpated. No crepitus. Lymphatic: No lymphadenopathy in the neck. Respiratory: No stridor or distress. Room air. Cardiovascular: Regular rate and rhythm. Extremities: No edema or cyanosis. Warm and well-perfused. Skin: No scars or lesions on face or neck. Neurologic: CN II-XII grossly intact. Moving all extremities without gross  abnormality. Other:  Independent Review of Additional Tests or Records: None Procedures: None Impression & Plans: Shynice H Roberg is a 58 y.o. female with recurrent epistaxis that started approximately 2 to 3 weeks ago.  Recurrent epistaxis - Likely secondary to dry nasal mucosa as well as digital trauma - Start nasal saline spray nightly - Start nasal saline gel twice daily - Use humidifier in the bedroom  nightly  Follow-up as needed  Adah Malkin, DO [facility]

## 2024-08-24 ENCOUNTER — Telehealth (INDEPENDENT_AMBULATORY_CARE_PROVIDER_SITE_OTHER): Payer: Self-pay

## 2024-08-24 ENCOUNTER — Telehealth (INDEPENDENT_AMBULATORY_CARE_PROVIDER_SITE_OTHER): Payer: Self-pay | Admitting: Otolaryngology

## 2024-08-24 NOTE — Telephone Encounter (Signed)
 I returned a call to facility where pt lives, [facility] # [phone]. They had called and asked if pt can start back on her Eliqus. I informed them that per Dr. Mila she can start it back as directed.

## 2024-08-24 NOTE — Telephone Encounter (Signed)
 [facility] is checking in to see if or when the patient can restart her elaquis medication?  Please advise.

## 2024-09-06 ENCOUNTER — Ambulatory Visit (HOSPITAL_COMMUNITY)
Admission: RE | Admit: 2024-09-06 | Discharge: 2024-09-06 | Disposition: A | Discharge status: Home / Self Care | Source: Ambulatory Visit | Attending: Vascular Surgery | Admitting: Vascular Surgery

## 2024-09-06 ENCOUNTER — Encounter (HOSPITAL_COMMUNITY)
Admission: RE | Disposition: A | Discharge status: Home / Self Care | Payer: Self-pay | Source: Ambulatory Visit | Attending: Vascular Surgery

## 2024-09-06 ENCOUNTER — Other Ambulatory Visit: Payer: Self-pay

## 2024-09-06 DIAGNOSIS — I509 Heart failure, unspecified: Secondary | ICD-10-CM | POA: Diagnosis not present

## 2024-09-06 DIAGNOSIS — Z87891 Personal history of nicotine dependence: Secondary | ICD-10-CM | POA: Diagnosis not present

## 2024-09-06 DIAGNOSIS — N186 End stage renal disease: Secondary | ICD-10-CM | POA: Insufficient documentation

## 2024-09-06 DIAGNOSIS — E1122 Type 2 diabetes mellitus with diabetic chronic kidney disease: Secondary | ICD-10-CM | POA: Insufficient documentation

## 2024-09-06 DIAGNOSIS — Z992 Dependence on renal dialysis: Secondary | ICD-10-CM | POA: Insufficient documentation

## 2024-09-06 DIAGNOSIS — I132 Hypertensive heart and chronic kidney disease with heart failure and with stage 5 chronic kidney disease, or end stage renal disease: Secondary | ICD-10-CM | POA: Diagnosis not present

## 2024-09-06 HISTORY — PX: DIALYSIS/PERMA CATHETER INSERTION: CATH118288

## 2024-09-06 LAB — GLUCOSE, CAPILLARY: Glucose-Capillary: 144 mg/dL — ABNORMAL HIGH (ref 70–99)

## 2024-09-06 SURGERY — DIALYSIS/PERMA CATHETER INSERTION
Anesthesia: LOCAL | Site: Chest

## 2024-09-06 MED ORDER — ACETAMINOPHEN 325 MG PO TABS
ORAL_TABLET | ORAL | Status: AC
  Filled 2024-09-06: qty 1

## 2024-09-06 MED ORDER — LIDOCAINE HCL (PF) 1 % IJ SOLN
INTRAMUSCULAR | Status: AC
  Filled 2024-09-06: qty 30

## 2024-09-06 MED ORDER — HEPARIN SODIUM (PORCINE) 1000 UNIT/ML IJ SOLN
INTRAMUSCULAR | Status: DC | PRN
  Administered 2024-09-06 (×2): 1600 [IU] via INTRAVENOUS

## 2024-09-06 MED ORDER — HEPARIN (PORCINE) IN NACL 1000-0.9 UT/500ML-% IV SOLN
INTRAVENOUS | Status: DC | PRN
Start: 2024-09-06 — End: 2024-09-06
  Administered 2024-09-06: 500 mL

## 2024-09-06 MED ORDER — LIDOCAINE HCL (PF) 1 % IJ SOLN
INTRAMUSCULAR | Status: DC | PRN
  Administered 2024-09-06 (×2): 10 mL

## 2024-09-06 MED ORDER — ACETAMINOPHEN 325 MG PO TABS
325.0000 mg | ORAL_TABLET | Freq: Four times a day (QID) | ORAL | Status: DC | PRN
  Administered 2024-09-06: 325 mg via ORAL

## 2024-09-06 MED ORDER — HEPARIN SODIUM (PORCINE) 1000 UNIT/ML IJ SOLN
INTRAMUSCULAR | Status: AC
Start: 2024-09-06 — End: 2024-09-06
  Filled 2024-09-06: qty 10

## 2024-09-06 SURGICAL SUPPLY — 4 items
CATH PALINDROME-P 19CM W/VT (CATHETERS) IMPLANT
KIT MICROPUNCTURE NIT STIFF (SHEATH) IMPLANT
SHEATH PROBE COVER 6X72 (BAG) IMPLANT
TRAY PV CATH (CUSTOM PROCEDURE TRAY) ×2 IMPLANT

## 2024-09-06 NOTE — H&P (Signed)
 HD ACCESS CENTER H&P   Patient ID: [name], female   DOB: 1966/07/23, 58 y.o.   MRN: [id number]  Subjective:     HPI [name] is a 58 y.o. female with ESRD presenting to the HD access center for intervention.  Past Medical History:  Diagnosis Date   Acute lower UTI 06/04/2018   Acute metabolic encephalopathy 03/11/2021   Acute on chronic respiratory failure with hypoxia (HCC) 07/20/2021   Acute respiratory failure requiring reintubation (HCC) 11/09/2012   Acute respiratory failure with hypoxia (HCC) 12/03/2020   AKI (acute kidney injury) 06/04/2018   Alcohol  use    Ankle fracture, lateral malleolus, closed 2013   Anxiety    Aortic atherosclerosis 03/10/2021   Atherosclerosis of native artery of both legs with gangrene (HCC)    Breast mass, left 2013   CHF (congestive heart failure) (HCC)    a. EF 20-25% by echo in 05/2016 with cath showing normal cors b. EF 50-55% in 07/2020 c. 01/2021: EF at 55-60% with moderate LVH   Chronic anemia    CKD (chronic kidney disease), stage III (HCC)    Cocaine abuse (HCC)    COPD (chronic obstructive pulmonary disease) (HCC)    Diabetes mellitus, type 2 (HCC)    Diabetic Charcot foot (HCC)    DKA (diabetic ketoacidosis) (HCC) 10/29/2020   Essential hypertension    H/O open hand wound 11/22/2018   History of cardiomyopathy    History of GI bleed 2011   Hyperlipidemia    Lactic acid acidosis 09/11/2014   Metabolic encephalopathy 03/11/2021   Noncompliance    Obesity    Oral candidiasis 03/10/2021   PAF (paroxysmal atrial fibrillation) (HCC)    Panic attacks    PAT (paroxysmal atrial tachycardia)    Previously on Amiodarone    Pneumonia due to COVID-19 virus 12/03/2020   Seizures (HCC) 10/01/2020   Sepsis due to skin infection (HCC) 04/01/2021   Shock (HCC) 10/29/2020   Sleep apnea    Not on CPAP   Stroke (HCC) 2018   Thrombocytosis 08/02/2017   Tobacco abuse    UGI bleed    Urinary incontinence    Family  History  Problem Relation Age of Onset   Hypertension Mother    Heart attack Mother    Hypertension Father        CABG    Hypertension Sister    Breast cancer Sister    Hypertension Sister    Cancer Sister        breast    Hypertension Daughter    Hypertension Brother    Hypertension Son    Arthritis Other    Cancer Other    Diabetes Other    Asthma Other    Past Surgical History:  Procedure Laterality Date   ANGIOPLASTY ILLIAC ARTERY Right 04/23/2021   Procedure: ANGIOPLASTY AND STENT SUPERFICIAL FEMORAL ARTERY;  Surgeon: Magda Debby SAILOR, MD;  Location: [facility] OR;  Service: Vascular;  Laterality: Right;   AORTOGRAM  04/23/2021   Procedure: AORTOGRAM;  Surgeon: Magda Debby SAILOR, MD;  Location: [facility] OR;  Service: Vascular;;   AORTOGRAM Left 04/30/2021   Procedure: left lower extremity angiogram with second order cannulation;  Surgeon: Magda Debby SAILOR, MD;  Location: [facility] OR;  Service: Vascular;  Laterality: Left;   BIOPSY  11/12/2020   Procedure: BIOPSY;  Surgeon: Eartha Angelia Sieving, MD;  Location: [facility] END[facility] SUITE;  Service: Gastroenterology;;   BREAST BIOPSY     CARDIAC CATHETERIZATION N/A 07/28/2016  Procedure: Left Heart Cath and Coronary Angiography;  Surgeon: Candyce GORMAN Reek, MD;  Location: [facility] INVASIVE CV LAB;  Service: Cardiovascular;  Laterality: N/A;   COLONOSCOPY N/A 05/10/2019   Procedure: COLONOSCOPY;  Surgeon: Harvey Margo CROME, MD;  Location: [facility];  Service: Endoscopy;  Laterality: N/A;  Phenergan  12.5 mg IV in pre-op    DILATION AND CURETTAGE OF UTERUS     ESOPHAGOGASTRODUODENOSCOPY (EGD) WITH PROPOFOL  N/A 11/12/2020   Procedure: ESOPHAGOGASTRODUODENOSCOPY (EGD) WITH PROPOFOL ;  Surgeon: Eartha Angelia Sieving, MD;  Location: [facility];  Service: Gastroenterology;  Laterality: N/A;   I & D EXTREMITY Bilateral 09/22/2017   Procedure: BILATERAL DEBRIDEMENT LEG/FOOT ULCERS, APPLY VERAFLO WOUND VAC;  Surgeon: Harden Jerona GAILS, MD;  Location: [facility] OR;  Service:  Orthopedics;  Laterality: Bilateral;   I & D EXTREMITY Right 10/11/2018   Procedure: IRRIGATION AND DEBRIDEMENT RIGHT HAND;  Surgeon: Camella Fallow, MD;  Location: [facility] OR;  Service: Orthopedics;  Laterality: Right;   I & D EXTREMITY Right 10/13/2018   Procedure: REPEAT IRRIGATION AND DEBRIDEMENT RIGHT HAND;  Surgeon: Camella Fallow, MD;  Location: [facility] OR;  Service: Orthopedics;  Laterality: Right;   I & D EXTREMITY Right 11/22/2018   Procedure: IRRIGATION AND DEBRIDEMENT AND PINNING RIGHT HAND;  Surgeon: Camella Fallow, MD;  Location: [facility] OR;  Service: Orthopedics;  Laterality: Right;   IR RADIOLOGIST EVAL & MGMT  07/05/2018   LOWER EXTREMITY ANGIOGRAM Bilateral 04/23/2021   Procedure: RIGHT  AND LEFT LOWER EXTREMITY ANGIOGRAM;  Surgeon: Magda Debby SAILOR, MD;  Location: [facility] OR;  Service: Vascular;  Laterality: Bilateral;   POLYPECTOMY  05/10/2019   Procedure: POLYPECTOMY;  Surgeon: Harvey Margo CROME, MD;  Location: [facility];  Service: Endoscopy;;   SKIN SPLIT GRAFT Bilateral 09/28/2017   Procedure: BILATERAL SPLIT THICKNESS SKIN GRAFT LEGS/FEET AND APPLY VAC;  Surgeon: Harden Jerona GAILS, MD;  Location: [facility] OR;  Service: Orthopedics;  Laterality: Bilateral;   SKIN SPLIT GRAFT Right 11/22/2018   Procedure: SKIN GRAFT SPLIT THICKNESS;  Surgeon: Camella Fallow, MD;  Location: [facility] OR;  Service: Orthopedics;  Laterality: Right;    Short Social History:  Social History   Tobacco Use   Smoking status: Former    Current packs/day: 0.00    Average packs/day: 0.3 packs/day for 20.0 years (5.0 ttl pk-yrs)    Types: Cigarettes    Start date: 06/03/1997    Quit date: 06/03/2017    Years since quitting: 7.2    Passive exposure: Never   Smokeless tobacco: Never  Substance Use Topics   Alcohol  use: Not Currently    Alcohol /week: 0.0 standard drinks of alcohol     Comment: Quit in 2017    Allergies  Allergen Reactions   Sodium Hypochlorite Anaphylaxis    (Bleach wipes) I am not able to breathe     Current Facility-Administered Medications  Medication Dose Route Frequency Provider Last Rate Last Admin   acetaminophen  (TYLENOL ) 325 MG tablet            acetaminophen  (TYLENOL ) tablet 325 mg  325 mg Oral Q6H PRN Pearline Norman GORMAN, MD   325 mg at 09/06/24 1045    REVIEW OF SYSTEMS All other systems were reviewed and are negative     Objective:   Objective   Vitals:   09/06/24 1025 09/06/24 1048  BP: (!) 143/109 (!) 128/98  Pulse: (!) 101 76  Resp: 12 12  Temp: 98 F (36.7 C)   TempSrc: Oral   SpO2: 100% 100%  There is no height or weight on file to calculate BMI.  Physical Exam General: no acute distress Cardiac: hemodynamically stable     Assessment/Plan:   [name] is a 58 y.o. female with ESRD presenting for tunneled dialysis catheter insertion.  Plans to start dialysis tomorrow Reviewed risks and benefits of TDC insertion and patient agreed to proceed.   Norman Serve, MD [facility]

## 2024-09-06 NOTE — Op Note (Signed)
    Patient name: [name] MRN: [id number] DOB: 07-24-1966 Sex: female  09/06/2024 Pre-operative Diagnosis: ESRD on HD Post-operative diagnosis:  Same Surgeon:  Norman GORMAN Serve, MD Procedure Performed:  Ultrasound and fluoroscopic guided right IJ tunneled dialysis catheter placement  Indications: Ms. Frame is a 58 year old female with ESRD presenting for tunneled dialysis catheter insertion.  There are plans for her to start dialysis tomorrow.  We reviewed the risks and benefits of tunneled dialysis catheter insertion and she agreed to proceed  Findings:  Patent and compressible right internal jugular vein. Successful placement of the tunneled dialysis catheter at the atriocaval junction   Procedure:    The patient was brought to the operating room positioned supine on operating room table.  The neck was prepped and draped in the usual sterile fashion.  Anesthesia was induced, preoperative antibiotics were administered and a timeout was performed. Using ultrasound guidance the right internal jugular vein was accessed with micropuncture technique.  Through the micropuncture sheath, the guidewire was advanced into the superior vena cava.  A small incision was made around the skin access point.  The access point was serially dilated under direct fluoroscopic guidance.  A peel-away sheath was introduced into the superior vena cava under fluoroscopic guidance.  A counterincision was made in the chest under the clavicle.  A 19 cm tunnel dialysis catheter was then tunneled under the skin, over the clavicle into the incision in the neck.  The tunneling device was removed and the catheter fed through the peel-away sheath into the superior vena cava.  The peel-away sheath was removed and the catheter gently pulled back.  Adequate position was confirmed with x-ray.  The catheter was tested and found to aspirate and flush with ease.    The catheter was sutured to the skin and the neck incision was  closed with 4-0 Monocryl.  The catheter was then capped and heparin  locked.  Contrast: none  Sedation: none   Impression: Successful placement of right IJ TDC at atriocaval junction   Norman GORMAN Serve MD [facility] [place] Office: [phone]

## 2024-09-07 ENCOUNTER — Encounter (HOSPITAL_COMMUNITY): Payer: Self-pay | Admitting: Vascular Surgery

## 2024-10-17 ENCOUNTER — Other Ambulatory Visit (INDEPENDENT_AMBULATORY_CARE_PROVIDER_SITE_OTHER): Payer: Self-pay

## 2024-10-17 DIAGNOSIS — R040 Epistaxis: Secondary | ICD-10-CM

## 2024-10-26 NOTE — Telephone Encounter (Signed)
 Overdue for CPAP follow-up, needs an appointment scheduled and download

## 2024-10-27 NOTE — Progress Notes (Signed)
 I called and spoke to [facility] with [facility]. Alex states pt may not be using her machine but they did agree to schedule an appt. I advised Marolyn if they were aware of the machine having an SD card inside, would they bring this as well. Alex verbalized understanding. NFN

## 2024-11-14 ENCOUNTER — Ambulatory Visit: Admitting: Primary Care

## 2024-11-14 ENCOUNTER — Encounter: Payer: Self-pay | Admitting: Primary Care

## 2024-11-14 VITALS — BP 112/70 | HR 62 | Temp 97.2°F | Ht 61.0 in | Wt 159.6 lb

## 2024-11-14 DIAGNOSIS — J9611 Chronic respiratory failure with hypoxia: Secondary | ICD-10-CM

## 2024-11-14 DIAGNOSIS — Z87891 Personal history of nicotine dependence: Secondary | ICD-10-CM | POA: Diagnosis not present

## 2024-11-14 DIAGNOSIS — J449 Chronic obstructive pulmonary disease, unspecified: Secondary | ICD-10-CM

## 2024-11-14 DIAGNOSIS — G4733 Obstructive sleep apnea (adult) (pediatric): Secondary | ICD-10-CM

## 2024-11-14 NOTE — Patient Instructions (Addendum)
" °  VISIT SUMMARY: During your visit, we discussed your recent sleep study results and your history of sleep apnea, COPD, atrial fibrillation, and congestive heart failure. We reviewed the importance of using your CPAP machine and discussed the management of your COPD.  YOUR PLAN: -OBSTRUCTIVE SLEEP APNEA: Obstructive sleep apnea is a condition where your breathing stops and starts repeatedly during sleep. Your recent sleep study showed severe sleep apnea with over thirty events per hour. It is important to use your CPAP machine at night with a pressure setting of 16cm h20 to reduce the risk of cardiac arrhythmias and improve your overall health. Please use distilled water  in the machine, clean it weekly, and change the supplies regularly: mask every month, tubing every three months, filter every month, and headgear and water  chamber every six months. We have ordered CPAP supplies to be sent to your facility. We will follow up in 8-12 weeks to assess your compliance and review your CPAP machine.  -CHRONIC OBSTRUCTIVE PULMONARY DISEASE (COPD): COPD is a chronic inflammatory lung disease that causes obstructed airflow from the lungs. You have a history of moderate obstructive lung disease from 2017 but are currently asymptomatic. We will perform basic spirometry at your next visit to reassess your lung function.  INSTRUCTIONS: Please follow up in 8-12 weeks to assess your compliance with the CPAP machine and bring the machine for review. We will also perform basic spirometry at your next visit to reassess your lung function.    Contains text generated by [employer].   "
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
## 2024-11-16 ENCOUNTER — Other Ambulatory Visit: Payer: Self-pay | Admitting: *Deleted

## 2024-11-16 ENCOUNTER — Telehealth: Payer: Self-pay | Admitting: *Deleted

## 2024-11-16 DIAGNOSIS — K760 Fatty (change of) liver, not elsewhere classified: Secondary | ICD-10-CM

## 2024-11-16 DIAGNOSIS — R7989 Other specified abnormal findings of blood chemistry: Secondary | ICD-10-CM

## 2024-11-16 DIAGNOSIS — G4733 Obstructive sleep apnea (adult) (pediatric): Secondary | ICD-10-CM
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
## 2024-11-27 ENCOUNTER — Emergency Department (HOSPITAL_COMMUNITY)

## 2024-11-27 ENCOUNTER — Emergency Department (HOSPITAL_COMMUNITY)
Admission: EM | Admit: 2024-11-27 | Discharge: 2024-11-27 | Disposition: A | Discharge status: Home / Self Care | Attending: Emergency Medicine | Admitting: Emergency Medicine

## 2024-11-27 ENCOUNTER — Encounter (HOSPITAL_COMMUNITY): Payer: Self-pay | Admitting: Emergency Medicine

## 2024-11-27 ENCOUNTER — Other Ambulatory Visit: Payer: Self-pay

## 2024-11-27 DIAGNOSIS — M25511 Pain in right shoulder: Secondary | ICD-10-CM | POA: Insufficient documentation

## 2024-11-27 DIAGNOSIS — W19XXXA Unspecified fall, initial encounter: Secondary | ICD-10-CM | POA: Insufficient documentation

## 2024-11-27 DIAGNOSIS — Z7901 Long term (current) use of anticoagulants: Secondary | ICD-10-CM | POA: Insufficient documentation

## 2024-11-27 DIAGNOSIS — M545 Low back pain, unspecified: Secondary | ICD-10-CM | POA: Insufficient documentation

## 2024-11-27 MED ORDER — ACETAMINOPHEN 500 MG PO TABS
1000.0000 mg | ORAL_TABLET | Freq: Once | ORAL | Status: AC
  Administered 2024-11-27: 1000 mg via ORAL
  Filled 2024-11-27: qty 2

## 2024-11-27 NOTE — ED Notes (Signed)
 [facility] called to transport patient. Nurse aware

## 2024-11-27 NOTE — Discharge Instructions (Signed)
As discussed, it is normal to feel worse in the days immediately following a fallregardless of medication use.  However, please take all medication as directed, use ice packs liberally.  If you develop any new, or concerning changes in your condition, please return here for further evaluation and management.

## 2024-11-30 ENCOUNTER — Other Ambulatory Visit (INDEPENDENT_AMBULATORY_CARE_PROVIDER_SITE_OTHER): Payer: Self-pay

## 2024-11-30 DIAGNOSIS — R040 Epistaxis: Secondary | ICD-10-CM

## 2024-11-30 LAB — PROTIME-INR
INR: 1.1 (ref 0.9–1.2)
Prothrombin Time: 12.1 s — ABNORMAL HIGH (ref 9.1–12.0)

## 2024-11-30 LAB — CBC WITH DIFFERENTIAL/PLATELET
Basophils Absolute: 0.1 10*3/uL (ref 0.0–0.2)
Basos: 2 %
EOS (ABSOLUTE): 0.2 10*3/uL (ref 0.0–0.4)
Eos: 3 %
Hematocrit: 38.2 % (ref 34.0–46.6)
Hemoglobin: 12.1 g/dL (ref 11.1–15.9)
Immature Grans (Abs): 0 10*3/uL (ref 0.0–0.1)
Immature Granulocytes: 0 %
Lymphocytes Absolute: 1.6 10*3/uL (ref 0.7–3.1)
Lymphs: 24 %
MCH: 32.3 pg (ref 26.6–33.0)
MCHC: 31.7 g/dL (ref 31.5–35.7)
MCV: 102 fL — ABNORMAL HIGH (ref 79–97)
Monocytes Absolute: 0.3 10*3/uL (ref 0.1–0.9)
Monocytes: 5 %
Neutrophils Absolute: 4.5 10*3/uL (ref 1.4–7.0)
Neutrophils: 66 %
Platelets: 226 10*3/uL (ref 150–450)
RBC: 3.75 x10E6/uL — ABNORMAL LOW (ref 3.77–5.28)
RDW: 14.6 % (ref 11.7–15.4)
WBC: 6.7 10*3/uL (ref 3.4–10.8)

## 2024-11-30 LAB — COMPREHENSIVE METABOLIC PANEL WITH GFR
ALT: 61 [IU]/L — ABNORMAL HIGH (ref 0–32)
AST: 49 [IU]/L — ABNORMAL HIGH (ref 0–40)
Albumin: 3.8 g/dL (ref 3.8–4.9)
Alkaline Phosphatase: 88 [IU]/L (ref 49–135)
BUN/Creatinine Ratio: 6 — ABNORMAL LOW (ref 12–28)
BUN: 38 mg/dL — ABNORMAL HIGH (ref 8–27)
Bilirubin Total: 0.3 mg/dL (ref 0.0–1.2)
CO2: 27 mmol/L (ref 20–29)
Calcium: 9.5 mg/dL (ref 8.7–10.3)
Chloride: 97 mmol/L (ref 96–106)
Creatinine, Ser: 5.85 mg/dL — ABNORMAL HIGH (ref 0.57–1.00)
Globulin, Total: 2.4 g/dL (ref 1.5–4.5)
Glucose: 188 mg/dL — ABNORMAL HIGH (ref 70–99)
Potassium: 3.9 mmol/L (ref 3.5–5.2)
Sodium: 143 mmol/L (ref 134–144)
Total Protein: 6.2 g/dL (ref 6.0–8.5)
eGFR: 8 mL/min/{1.73_m2} — ABNORMAL LOW

## 2024-12-12 ENCOUNTER — Encounter: Admitting: Vascular Surgery

## 2025-01-09 ENCOUNTER — Ambulatory Visit: Admitting: Primary Care

## 2025-01-09 ENCOUNTER — Encounter
